# Patient Record
Sex: Female | Born: 1950 | Race: Black or African American | Hispanic: No | Marital: Married | Smoking: Never smoker
Health system: Southern US, Community
[De-identification: ages and names within clinical notes are randomized; demographics above are authoritative.]

## PROBLEM LIST (undated history)

## (undated) ENCOUNTER — Emergency Department (HOSPITAL_COMMUNITY): Admission: EM | Payer: Medicare PPO | Source: Home / Self Care

## (undated) DIAGNOSIS — M1990 Unspecified osteoarthritis, unspecified site: Secondary | ICD-10-CM

## (undated) DIAGNOSIS — M545 Low back pain, unspecified: Secondary | ICD-10-CM

## (undated) DIAGNOSIS — E782 Mixed hyperlipidemia: Secondary | ICD-10-CM

## (undated) DIAGNOSIS — F32A Depression, unspecified: Secondary | ICD-10-CM

## (undated) DIAGNOSIS — G43909 Migraine, unspecified, not intractable, without status migrainosus: Secondary | ICD-10-CM

## (undated) DIAGNOSIS — R51 Headache: Secondary | ICD-10-CM

## (undated) DIAGNOSIS — M109 Gout, unspecified: Secondary | ICD-10-CM

## (undated) DIAGNOSIS — F419 Anxiety disorder, unspecified: Secondary | ICD-10-CM

## (undated) DIAGNOSIS — R519 Headache, unspecified: Secondary | ICD-10-CM

## (undated) DIAGNOSIS — J45909 Unspecified asthma, uncomplicated: Secondary | ICD-10-CM

## (undated) DIAGNOSIS — I10 Essential (primary) hypertension: Secondary | ICD-10-CM

## (undated) DIAGNOSIS — G8929 Other chronic pain: Secondary | ICD-10-CM

## (undated) DIAGNOSIS — G459 Transient cerebral ischemic attack, unspecified: Secondary | ICD-10-CM

## (undated) DIAGNOSIS — E119 Type 2 diabetes mellitus without complications: Secondary | ICD-10-CM

## (undated) DIAGNOSIS — K317 Polyp of stomach and duodenum: Secondary | ICD-10-CM

## (undated) DIAGNOSIS — F329 Major depressive disorder, single episode, unspecified: Secondary | ICD-10-CM

## (undated) DIAGNOSIS — K635 Polyp of colon: Secondary | ICD-10-CM

## (undated) DIAGNOSIS — Z8719 Personal history of other diseases of the digestive system: Secondary | ICD-10-CM

## (undated) HISTORY — PX: LAPAROSCOPIC CHOLECYSTECTOMY: SUR755

## (undated) HISTORY — DX: Major depressive disorder, single episode, unspecified: F32.9

## (undated) HISTORY — PX: BREAST BIOPSY: SHX20

## (undated) HISTORY — DX: Unspecified osteoarthritis, unspecified site: M19.90

## (undated) HISTORY — PX: DILATION AND CURETTAGE OF UTERUS: SHX78

## (undated) HISTORY — DX: Polyp of stomach and duodenum: K31.7

## (undated) HISTORY — PX: CARPAL TUNNEL RELEASE: SHX101

## (undated) HISTORY — PX: TUBAL LIGATION: SHX77

## (undated) HISTORY — DX: Polyp of colon: K63.5

## (undated) HISTORY — DX: Mixed hyperlipidemia: E78.2

## (undated) HISTORY — DX: Depression, unspecified: F32.A

## (undated) HISTORY — DX: Gout, unspecified: M10.9

---

## 1980-06-16 HISTORY — PX: VAGINAL HYSTERECTOMY: SUR661

## 2001-06-16 HISTORY — PX: KNEE ARTHROSCOPY: SHX127

## 2012-10-22 LAB — HM MAMMOGRAPHY

## 2014-11-15 DIAGNOSIS — G459 Transient cerebral ischemic attack, unspecified: Secondary | ICD-10-CM

## 2014-11-15 HISTORY — DX: Transient cerebral ischemic attack, unspecified: G45.9

## 2016-01-11 ENCOUNTER — Inpatient Hospital Stay (HOSPITAL_COMMUNITY)
Admission: EM | Admit: 2016-01-11 | Discharge: 2016-01-15 | DRG: 603 | Disposition: A | Discharge status: Home Health | Payer: Medicare PPO | Attending: Internal Medicine | Admitting: Internal Medicine

## 2016-01-11 ENCOUNTER — Encounter (HOSPITAL_COMMUNITY): Payer: Self-pay

## 2016-01-11 DIAGNOSIS — M109 Gout, unspecified: Secondary | ICD-10-CM | POA: Diagnosis present

## 2016-01-11 DIAGNOSIS — E860 Dehydration: Secondary | ICD-10-CM | POA: Diagnosis present

## 2016-01-11 DIAGNOSIS — Z79899 Other long term (current) drug therapy: Secondary | ICD-10-CM | POA: Diagnosis not present

## 2016-01-11 DIAGNOSIS — J45909 Unspecified asthma, uncomplicated: Secondary | ICD-10-CM | POA: Diagnosis present

## 2016-01-11 DIAGNOSIS — L03116 Cellulitis of left lower limb: Secondary | ICD-10-CM | POA: Diagnosis present

## 2016-01-11 DIAGNOSIS — Z888 Allergy status to other drugs, medicaments and biological substances status: Secondary | ICD-10-CM | POA: Diagnosis not present

## 2016-01-11 DIAGNOSIS — E11622 Type 2 diabetes mellitus with other skin ulcer: Secondary | ICD-10-CM | POA: Diagnosis present

## 2016-01-11 DIAGNOSIS — E1142 Type 2 diabetes mellitus with diabetic polyneuropathy: Secondary | ICD-10-CM | POA: Diagnosis present

## 2016-01-11 DIAGNOSIS — I878 Other specified disorders of veins: Secondary | ICD-10-CM | POA: Diagnosis present

## 2016-01-11 DIAGNOSIS — L98499 Non-pressure chronic ulcer of skin of other sites with unspecified severity: Secondary | ICD-10-CM | POA: Diagnosis present

## 2016-01-11 DIAGNOSIS — Z8673 Personal history of transient ischemic attack (TIA), and cerebral infarction without residual deficits: Secondary | ICD-10-CM

## 2016-01-11 DIAGNOSIS — Z7982 Long term (current) use of aspirin: Secondary | ICD-10-CM | POA: Diagnosis not present

## 2016-01-11 DIAGNOSIS — I10 Essential (primary) hypertension: Secondary | ICD-10-CM

## 2016-01-11 DIAGNOSIS — I159 Secondary hypertension, unspecified: Secondary | ICD-10-CM

## 2016-01-11 DIAGNOSIS — Z6841 Body Mass Index (BMI) 40.0 and over, adult: Secondary | ICD-10-CM

## 2016-01-11 DIAGNOSIS — Z794 Long term (current) use of insulin: Secondary | ICD-10-CM

## 2016-01-11 DIAGNOSIS — E876 Hypokalemia: Secondary | ICD-10-CM | POA: Diagnosis present

## 2016-01-11 DIAGNOSIS — E119 Type 2 diabetes mellitus without complications: Secondary | ICD-10-CM

## 2016-01-11 DIAGNOSIS — M79609 Pain in unspecified limb: Secondary | ICD-10-CM | POA: Diagnosis not present

## 2016-01-11 DIAGNOSIS — E118 Type 2 diabetes mellitus with unspecified complications: Secondary | ICD-10-CM

## 2016-01-11 DIAGNOSIS — A419 Sepsis, unspecified organism: Secondary | ICD-10-CM

## 2016-01-11 HISTORY — DX: Unspecified osteoarthritis, unspecified site: M19.90

## 2016-01-11 HISTORY — DX: Unspecified asthma, uncomplicated: J45.909

## 2016-01-11 HISTORY — DX: Essential (primary) hypertension: I10

## 2016-01-11 HISTORY — DX: Transient cerebral ischemic attack, unspecified: G45.9

## 2016-01-11 LAB — CBC WITH DIFFERENTIAL/PLATELET
Basophils Absolute: 0 10*3/uL (ref 0.0–0.1)
Basophils Relative: 0 %
Eosinophils Absolute: 0.1 10*3/uL (ref 0.0–0.7)
Eosinophils Relative: 1 %
HEMATOCRIT: 40.2 % (ref 36.0–46.0)
Hemoglobin: 13.3 g/dL (ref 12.0–15.0)
LYMPHS ABS: 1.9 10*3/uL (ref 0.7–4.0)
LYMPHS PCT: 10 %
MCH: 30.2 pg (ref 26.0–34.0)
MCHC: 33.1 g/dL (ref 30.0–36.0)
MCV: 91.2 fL (ref 78.0–100.0)
MONO ABS: 0.8 10*3/uL (ref 0.1–1.0)
MONOS PCT: 4 %
NEUTROS ABS: 16.3 10*3/uL — AB (ref 1.7–7.7)
Neutrophils Relative %: 85 %
Platelets: 248 10*3/uL (ref 150–400)
RBC: 4.41 MIL/uL (ref 3.87–5.11)
RDW: 15.2 % (ref 11.5–15.5)
WBC: 19.1 10*3/uL — ABNORMAL HIGH (ref 4.0–10.5)

## 2016-01-11 LAB — COMPREHENSIVE METABOLIC PANEL
ALT: 16 U/L (ref 14–54)
ANION GAP: 9 (ref 5–15)
AST: 19 U/L (ref 15–41)
Albumin: 3.1 g/dL — ABNORMAL LOW (ref 3.5–5.0)
Alkaline Phosphatase: 105 U/L (ref 38–126)
BILIRUBIN TOTAL: 0.6 mg/dL (ref 0.3–1.2)
BUN: 11 mg/dL (ref 6–20)
CALCIUM: 9 mg/dL (ref 8.9–10.3)
CO2: 22 mmol/L (ref 22–32)
Chloride: 102 mmol/L (ref 101–111)
Creatinine, Ser: 1.03 mg/dL — ABNORMAL HIGH (ref 0.44–1.00)
GFR calc Af Amer: >60 mL/min (ref >60)
GFR, EST NON AFRICAN AMERICAN: 55 mL/min — AB (ref >60)
Glucose, Bld: 186 mg/dL — ABNORMAL HIGH (ref 65–99)
POTASSIUM: 3.2 mmol/L — AB (ref 3.5–5.1)
Sodium: 133 mmol/L — ABNORMAL LOW (ref 135–145)
TOTAL PROTEIN: 7.9 g/dL (ref 6.5–8.1)

## 2016-01-11 LAB — I-STAT CG4 LACTIC ACID, ED: LACTIC ACID, VENOUS: 1.73 mmol/L (ref 0.5–1.9)

## 2016-01-11 LAB — GLUCOSE, CAPILLARY
Glucose-Capillary: 174 mg/dL — ABNORMAL HIGH (ref 65–99)
Glucose-Capillary: 230 mg/dL — ABNORMAL HIGH (ref 65–99)

## 2016-01-11 MED ORDER — ATENOLOL 25 MG PO TABS
25.0000 mg | ORAL_TABLET | Freq: Every day | ORAL | Status: DC
  Filled 2016-01-11: qty 1

## 2016-01-11 MED ORDER — HYDROCODONE-ACETAMINOPHEN 10-325 MG PO TABS
1.0000 | ORAL_TABLET | Freq: Four times a day (QID) | ORAL | Status: DC | PRN
  Administered 2016-01-12 – 2016-01-13 (×5): 1 via ORAL
  Filled 2016-01-11 (×5): qty 1

## 2016-01-11 MED ORDER — GABAPENTIN 300 MG PO CAPS
300.0000 mg | ORAL_CAPSULE | Freq: Two times a day (BID) | ORAL | Status: DC
  Administered 2016-01-11 – 2016-01-15 (×8): 300 mg via ORAL
  Filled 2016-01-11 (×9): qty 1

## 2016-01-11 MED ORDER — ATENOLOL 50 MG PO TABS
25.0000 mg | ORAL_TABLET | Freq: Every day | ORAL | Status: DC
  Administered 2016-01-12 – 2016-01-15 (×4): 25 mg via ORAL
  Filled 2016-01-11 (×4): qty 1

## 2016-01-11 MED ORDER — ENOXAPARIN SODIUM 40 MG/0.4ML ~~LOC~~ SOLN
40.0000 mg | SUBCUTANEOUS | Status: DC
Start: 2016-01-11 — End: 2016-01-11
  Administered 2016-01-11: 40 mg via SUBCUTANEOUS
  Filled 2016-01-11: qty 0.4

## 2016-01-11 MED ORDER — ONDANSETRON HCL 4 MG/2ML IJ SOLN: 4.0000 mg | Freq: Four times a day (QID) | INTRAMUSCULAR | Status: DC | PRN

## 2016-01-11 MED ORDER — PIPERACILLIN-TAZOBACTAM 3.375 G IVPB
3.3750 g | Freq: Three times a day (TID) | INTRAVENOUS | Status: AC
  Administered 2016-01-11 – 2016-01-14 (×9): 3.375 g via INTRAVENOUS
  Filled 2016-01-11 (×10): qty 50

## 2016-01-11 MED ORDER — ALBUTEROL SULFATE (2.5 MG/3ML) 0.083% IN NEBU: 2.5000 mg | INHALATION_SOLUTION | Freq: Four times a day (QID) | RESPIRATORY_TRACT | Status: DC | PRN

## 2016-01-11 MED ORDER — ACETAMINOPHEN 650 MG RE SUPP: 650.0000 mg | Freq: Four times a day (QID) | RECTAL | Status: DC | PRN

## 2016-01-11 MED ORDER — INSULIN ASPART 100 UNIT/ML ~~LOC~~ SOLN
0.0000 [IU] | Freq: Three times a day (TID) | SUBCUTANEOUS | Status: DC
  Administered 2016-01-11: 2 [IU] via SUBCUTANEOUS
  Administered 2016-01-11: 3 [IU] via SUBCUTANEOUS
  Administered 2016-01-12: 5 [IU] via SUBCUTANEOUS
  Administered 2016-01-12: 2 [IU] via SUBCUTANEOUS
  Administered 2016-01-12 – 2016-01-13 (×2): 3 [IU] via SUBCUTANEOUS
  Administered 2016-01-13: 5 [IU] via SUBCUTANEOUS
  Administered 2016-01-13 – 2016-01-14 (×4): 3 [IU] via SUBCUTANEOUS
  Administered 2016-01-15: 2 [IU] via SUBCUTANEOUS

## 2016-01-11 MED ORDER — VANCOMYCIN HCL 10 G IV SOLR
1250.0000 mg | Freq: Two times a day (BID) | INTRAVENOUS | Status: AC
  Administered 2016-01-11 – 2016-01-14 (×7): 1250 mg via INTRAVENOUS
  Filled 2016-01-11 (×7): qty 1250

## 2016-01-11 MED ORDER — MAGNESIUM CITRATE PO SOLN
1.0000 | Freq: Once | ORAL | Status: AC | PRN
  Administered 2016-01-12: 1 via ORAL
  Filled 2016-01-11: qty 296

## 2016-01-11 MED ORDER — ONDANSETRON HCL 4 MG PO TABS: 4.0000 mg | ORAL_TABLET | Freq: Four times a day (QID) | ORAL | Status: DC | PRN

## 2016-01-11 MED ORDER — MAGNESIUM GLUCONATE 500 MG PO TABS
500.0000 mg | ORAL_TABLET | Freq: Two times a day (BID) | ORAL | Status: DC
  Administered 2016-01-11 – 2016-01-15 (×8): 500 mg via ORAL
  Filled 2016-01-11 (×9): qty 1

## 2016-01-11 MED ORDER — SODIUM CHLORIDE 0.9 % IV BOLUS (SEPSIS)
1000.0000 mL | Freq: Once | INTRAVENOUS | Status: AC
  Administered 2016-01-11: 1000 mL via INTRAVENOUS

## 2016-01-11 MED ORDER — CLONAZEPAM 0.5 MG PO TABS: 0.5000 mg | ORAL_TABLET | Freq: Every day | ORAL | Status: DC | PRN

## 2016-01-11 MED ORDER — SENNOSIDES-DOCUSATE SODIUM 8.6-50 MG PO TABS
1.0000 | ORAL_TABLET | Freq: Every evening | ORAL | Status: DC | PRN
  Filled 2016-01-11: qty 1

## 2016-01-11 MED ORDER — MORPHINE SULFATE (PF) 2 MG/ML IV SOLN: 1.0000 mg | INTRAVENOUS | Status: DC | PRN

## 2016-01-11 MED ORDER — CYCLOBENZAPRINE HCL 10 MG PO TABS
10.0000 mg | ORAL_TABLET | Freq: Three times a day (TID) | ORAL | Status: DC | PRN
  Administered 2016-01-12 – 2016-01-14 (×2): 10 mg via ORAL
  Filled 2016-01-11 (×2): qty 1

## 2016-01-11 MED ORDER — PIPERACILLIN-TAZOBACTAM 3.375 G IVPB 30 MIN
3.3750 g | Freq: Once | INTRAVENOUS | Status: AC
  Administered 2016-01-11: 3.375 g via INTRAVENOUS
  Filled 2016-01-11: qty 50

## 2016-01-11 MED ORDER — HYDROCERIN EX CREA
TOPICAL_CREAM | Freq: Every day | CUTANEOUS | Status: DC
  Administered 2016-01-12 – 2016-01-14 (×3): via TOPICAL
  Administered 2016-01-15: 1 via TOPICAL
  Filled 2016-01-11 (×2): qty 113

## 2016-01-11 MED ORDER — TRAZODONE HCL 50 MG PO TABS: 25.0000 mg | ORAL_TABLET | Freq: Every evening | ORAL | Status: DC | PRN

## 2016-01-11 MED ORDER — ACETAMINOPHEN 325 MG PO TABS
650.0000 mg | ORAL_TABLET | Freq: Four times a day (QID) | ORAL | Status: DC | PRN
  Filled 2016-01-11 (×2): qty 2

## 2016-01-11 MED ORDER — BISACODYL 10 MG RE SUPP: 10.0000 mg | Freq: Every day | RECTAL | Status: DC | PRN

## 2016-01-11 MED ORDER — ALBUTEROL SULFATE HFA 108 (90 BASE) MCG/ACT IN AERS: 2.0000 | INHALATION_SPRAY | Freq: Four times a day (QID) | RESPIRATORY_TRACT | Status: DC | PRN

## 2016-01-11 MED ORDER — ASPIRIN 325 MG PO TABS
325.0000 mg | ORAL_TABLET | Freq: Every day | ORAL | Status: DC
  Administered 2016-01-11 – 2016-01-15 (×5): 325 mg via ORAL
  Filled 2016-01-11 (×5): qty 1

## 2016-01-11 MED ORDER — ALLOPURINOL 300 MG PO TABS
300.0000 mg | ORAL_TABLET | Freq: Every day | ORAL | Status: DC
  Administered 2016-01-11 – 2016-01-15 (×5): 300 mg via ORAL
  Filled 2016-01-11 (×4): qty 1
  Filled 2016-01-11: qty 3

## 2016-01-11 MED ORDER — SODIUM CHLORIDE 0.9 % IV SOLN
INTRAVENOUS | Status: DC
  Administered 2016-01-11: 11:00:00 via INTRAVENOUS

## 2016-01-11 MED ORDER — VANCOMYCIN HCL 10 G IV SOLR
2000.0000 mg | Freq: Once | INTRAVENOUS | Status: AC
  Administered 2016-01-11: 2000 mg via INTRAVENOUS
  Filled 2016-01-11: qty 2000

## 2016-01-11 MED ORDER — POTASSIUM CHLORIDE CRYS ER 20 MEQ PO TBCR
40.0000 meq | EXTENDED_RELEASE_TABLET | Freq: Once | ORAL | Status: AC
  Administered 2016-01-11: 40 meq via ORAL
  Filled 2016-01-11: qty 2

## 2016-01-11 MED ORDER — ENOXAPARIN SODIUM 80 MG/0.8ML ~~LOC~~ SOLN
80.0000 mg | SUBCUTANEOUS | Status: DC
  Administered 2016-01-12 – 2016-01-14 (×3): 80 mg via SUBCUTANEOUS
  Filled 2016-01-11 (×3): qty 0.8

## 2016-01-11 MED ORDER — NIFEDIPINE ER OSMOTIC RELEASE 90 MG PO TB24
90.0000 mg | ORAL_TABLET | Freq: Every day | ORAL | Status: DC
  Administered 2016-01-12 – 2016-01-15 (×4): 90 mg via ORAL
  Filled 2016-01-11 (×5): qty 1

## 2016-01-11 NOTE — ED Provider Notes (Signed)
[facility]-EMERGENCY DEPT Provider Note   CSN: [id number] Arrival date & time: 01/11/16  2952  First Provider Contact:  None       History   Chief Complaint Chief Complaint  Patient presents with  . Fever    HPI [name] is a 65 y.o. female.  HPI   65 year old obese African-American female who presents with a 2 day history of left lower extremity swelling. 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 She states over the last 2 days, she's had progressively worsening pain and swelling of her left lower externally. He began to get red and hot yesterday with increased pain. Pain is an aching, throbbing pain that is worse with any movement. She then spiked a fever to 101 yesterday and felt generally unwell. She also felt mildly dehydrated. She simply presents for further evaluation. The redness has worsened and spread over the last 12 hours per report. Denies any vomiting. She has not been on any antibiotics recently.  Past Medical History:  Diagnosis Date  . Arthritis   . Asthma   . Diabetes mellitus without complication (HCC)   . Hypertension   . TIA (transient ischemic attack)     Patient Active Problem List   Diagnosis Date Noted  . Cellulitis of left leg 01/11/2016  . DM (diabetes mellitus) (HCC) 01/11/2016  . Hypertension 01/11/2016    Past Surgical History:  Procedure Laterality Date  . CARPAL TUNNEL RELEASE    . CHOLECYSTECTOMY    . KNEE SURGERY      OB History    No data available       Home Medications    Prior to Admission medications   Medication Sig Start Date End Date Taking? Authorizing Provider  albuterol (PROVENTIL HFA;VENTOLIN HFA) 108 (90 Base) MCG/ACT inhaler Inhale 2 puffs into the lungs every 6 (six) hours as needed for wheezing or shortness of breath.   Yes Historical Provider, MD  allopurinol (ZYLOPRIM) 300 MG tablet  Take 300 mg by mouth daily.   Yes Historical Provider, MD  aspirin 325 MG tablet Take 325 mg by mouth daily.   Yes Historical Provider, MD  atenolol (TENORMIN) 25 MG tablet Take 25-50 mg by mouth daily. Take 50 mg every morning  Take 25 mg every evening   Yes Historical Provider, MD  clonazePAM (KLONOPIN) 0.5 MG tablet Take 0.5 mg by mouth daily as needed for anxiety.   Yes Historical Provider, MD  cyclobenzaprine (FLEXERIL) 10 MG tablet Take 10 mg by mouth 3 (three) times daily as needed for muscle spasms.   Yes Historical Provider, MD  gabapentin (NEURONTIN) 300 MG capsule Take 300 mg by mouth 2 (two) times daily.   Yes Historical Provider, MD  HYDROcodone-acetaminophen (NORCO) 10-325 MG tablet Take 1 tablet by mouth every 6 (six) hours as needed for moderate pain.   Yes Historical Provider, MD  Insulin Degludec (TRESIBA FLEXTOUCH) 100 UNIT/ML SOPN Inject 75 Units into the skin daily.   Yes Historical Provider, MD  insulin glulisine (APIDRA) 100 UNIT/ML injection Inject 10-15 Units into the skin 2 (two) times daily. <160 take 10 units  >161 take 15 units   Yes Historical Provider, MD  magnesium gluconate (MAGONATE) 500 MG tablet Take 500 mg by mouth 2 (two) times daily.   Yes Historical Provider, MD  NIFEdipine (PROCARDIA XL/ADALAT-CC) 90 MG 24 hr tablet Take 90 mg  by mouth daily.   Yes Historical Provider, MD    Family History History reviewed. No pertinent family history.  Social History Social History  Substance Use Topics  . Smoking status: Never Smoker  . Smokeless tobacco: Never Used  . Alcohol use Not on file     Allergies   Ace inhibitors and Metformin and related   Review of Systems Review of Systems  Constitutional: Positive for chills, fatigue and fever.  HENT: Negative for congestion and rhinorrhea.   Eyes: Negative for visual disturbance.  Respiratory: Negative for cough, shortness of breath and wheezing.   Cardiovascular: Positive for leg swelling. Negative for  chest pain.  Gastrointestinal: Negative for abdominal pain, diarrhea and nausea.  Genitourinary: Negative for dysuria and flank pain.  Musculoskeletal: Negative for neck pain.  Skin: Positive for rash.  Neurological: Negative for syncope, weakness and headaches.     Physical Exam Updated Vital Signs BP 118/60 (BP Location: Left Arm)   Pulse 94   Temp (!) 100.4 F (38 C) (Oral)   Resp 20   Ht  (1.651 m)   Wt (!) 351 lb 6.6 oz (159.4 kg)   SpO2 97%   BMI 58.48 kg/m   Physical Exam  Constitutional: She appears well-developed and well-nourished. No distress.  HENT:  Head: Normocephalic.  Mouth/Throat: Oropharynx is clear and moist. No oropharyngeal exudate.  Eyes: Conjunctivae are normal. Pupils are equal, round, and reactive to light.  Neck: Normal range of motion. Neck supple.  Cardiovascular: Normal rate, regular rhythm and normal heart sounds.  Exam reveals no friction rub.   No murmur heard. Pulmonary/Chest: Effort normal and breath sounds normal. No respiratory distress. She has no wheezes. She has no rales.  Abdominal: Soft. She exhibits no distension. There is no tenderness.  Musculoskeletal: She exhibits no edema.  Neurological: She is alert. She exhibits normal muscle tone.  Skin: Skin is warm. Rash noted.  Nursing note and vitals reviewed.   LOWER EXTREMITY EXAM: LEFT  INSPECTION & PALPATION: Marked chronic venous stasis changes and lichenification of feet bilaterally. Left lower extremity with chronic skin changes with superimposed erythema, tenderness to palpation, and warmth. No open or draining wounds. No crepitance. No tenderness beyond the areas of erythema.  SENSORY: sensation is intact to light touch in:  Superficial peroneal nerve distribution (over dorsum of foot) Deep peroneal nerve distribution (over first dorsal web space) Sural nerve distribution (over lateral aspect 5th metatarsal) Saphenous nerve distribution (over medial instep)  MOTOR:    + Motor EHL (great toe dorsiflexion) + FHL (great toe plantar flexion)  + TA (ankle dorsiflexion)  + GSC (ankle plantar flexion)  VASCULAR: 2+ dorsalis pedis and posterior tibialis pulses Capillary refill < 2 sec, toes warm and well-perfused  COMPARTMENTS: Soft, warm, well-perfused No pain with passive extension No parethesias   ED Treatments / Results  Labs (all labs ordered are listed, but only abnormal results are displayed) Labs Reviewed  CBC WITH DIFFERENTIAL/PLATELET - Abnormal; Notable for the following:       Result Value   WBC 19.1 (*)    Neutro Abs 16.3 (*)    All other components within normal limits  COMPREHENSIVE METABOLIC PANEL - Abnormal; Notable for the following:    Sodium 133 (*)    Potassium 3.2 (*)    Glucose, Bld 186 (*)    Creatinine, Ser 1.03 (*)    Albumin 3.1 (*)    GFR calc non Af Amer 55 (*)    All other  components within normal limits  GLUCOSE, CAPILLARY - Abnormal; Notable for the following:    Glucose-Capillary 174 (*)    All other components within normal limits  CULTURE, BLOOD (ROUTINE X 2)  CULTURE, BLOOD (ROUTINE X 2)  HEMOGLOBIN A1C  HIV ANTIBODY (ROUTINE TESTING)  I-STAT CG4 LACTIC ACID, ED    EKG  EKG Interpretation None       Radiology No results found.  Procedures Procedures (including critical care time)  Medications Ordered in ED Medications  vancomycin (VANCOCIN) 1,250 mg in sodium chloride 0.9 % 250 mL IVPB (not administered)  allopurinol (ZYLOPRIM) tablet 300 mg (300 mg Oral Given 01/11/16 1116)  aspirin tablet 325 mg (325 mg Oral Given 01/11/16 1116)  clonazePAM (KLONOPIN) tablet 0.5 mg (not administered)  cyclobenzaprine (FLEXERIL) tablet 10 mg (not administered)  gabapentin (NEURONTIN) capsule 300 mg (300 mg Oral Given 01/11/16 1116)  HYDROcodone-acetaminophen (NORCO) 10-325 MG per tablet 1 tablet (not administered)  magnesium gluconate (MAGONATE) tablet 500 mg (500 mg Oral Not Given 01/11/16 1123)   NIFEdipine (PROCARDIA XL/ADALAT-CC) 24 hr tablet 90 mg (90 mg Oral Not Given 01/11/16 1123)  0.9 %  sodium chloride infusion ( Intravenous Transfusing/Transfer 01/11/16 1136)  acetaminophen (TYLENOL) tablet 650 mg (not administered)    Or  acetaminophen (TYLENOL) suppository 650 mg (not administered)  morphine 2 MG/ML injection 1 mg (not administered)  traZODone (DESYREL) tablet 25 mg (not administered)  senna-docusate (Senokot-S) tablet 1 tablet (not administered)  magnesium citrate solution 1 Bottle (not administered)  ondansetron (ZOFRAN) tablet 4 mg (not administered)    Or  ondansetron (ZOFRAN) injection 4 mg (not administered)  insulin aspart (novoLOG) injection 0-9 Units (2 Units Subcutaneous Given 01/11/16 1251)  piperacillin-tazobactam (ZOSYN) IVPB 3.375 g (not administered)  hydrocerin (EUCERIN) cream ( Topical Not Given 01/11/16 1124)  enoxaparin (LOVENOX) injection 80 mg (not administered)  albuterol (PROVENTIL) (2.5 MG/3ML) 0.083% nebulizer solution 2.5 mg (not administered)  atenolol (TENORMIN) tablet 25 mg (25 mg Oral Not Given 01/11/16 1300)  vancomycin (VANCOCIN) 2,000 mg in sodium chloride 0.9 % 500 mL IVPB (0 mg Intravenous Stopped 01/11/16 1059)  sodium chloride 0.9 % bolus 1,000 mL (0 mLs Intravenous Stopped 01/11/16 1041)  potassium chloride SA (K-DUR,KLOR-CON) CR tablet 40 mEq (40 mEq Oral Given 01/11/16 0918)  piperacillin-tazobactam (ZOSYN) IVPB 3.375 g (3.375 g Intravenous Transfusing/Transfer 01/11/16 1136)     Initial Impression / Assessment and Plan / ED Course  I have reviewed the triage vital signs and the nursing notes.  Pertinent labs & imaging results that were available during my care of the patient were reviewed by me and considered in my medical decision making (see chart for details).  65 year old African-American female with past medical history of poorly controlled diabetes who presents with a 2 day history of left lower extremity redness, swelling, and  pain with fever to 101 at home. Exam and history concerning for acute cellulitis of left lower extremity, likely secondary to skin breakdown from chronic skin changes. Her fever is concerning for possible developing sepsis and she does have elevated white blood cell count. Lactic acid is normal, however. No hypotension or signs or shock. No tenderness beyond the areas of erythema to suggest necrotizing infection. DVT less likely in the setting of acute change, warmth, and fever. Will start IV vancomycin given patient's history of DKA and her recurrent, complicated cellulitis, and admit to medicine for further management.  Final Clinical Impressions(s) / ED Diagnoses   Final diagnoses:  Cellulitis of left leg  Type 2 diabetes mellitus with complication, with long-term current use of insulin (HCC)  Sepsis, due to unspecified organism [facility])     Shaune Pollack, MD 01/11/16 [phone]

## 2016-01-11 NOTE — ED Triage Notes (Signed)
Pt presents with report of fever that began last night.  Pt reports fever of 102.4.  Pt reports area to L foot x 1 year, seen at wound center in [facility].  Pt reports warmth to foot and intermittent drainage.

## 2016-01-11 NOTE — H&P (Signed)
History and Physical    [name] ZOX:[id number] DOB: 01/08/1951 DOA: 01/11/2016   PCP: No primary care provider on file.   Patient coming from:  Home  Chief Complaint: Left leg pain and redness   HPI: [name] is a 66 y.o. female recently moving form [facility], [place] on 7/4 to be with her son,  with medical history significant for DM, HTN, prior TIA, astha, arthritis, history of lower extremity cellulitis  In September 2016 requiring 1 month hospitalization and wound care follow up until 10/2015, presenting today with 2 day history of left lower extremity swelling, with worsening pain and increasing erythema and warmth since yesterday,She reports that this redness has been spreading over the last 12 hours.. The pain is throbbing, aching and worse with any movement.she denies any take bites or any insect bite. Denies any dog/cat contacts. She does admit to scratching that affected area  When becomes pruritic. She had spike a fever to 101 yesterday, and felt generally unwell. She was also dehydrated. She denies any nausea or vomiting. She denies any diarrhea. No urinary symptoms. No confusion is reported.   ED Course:  BP 115/68   Pulse 94   Temp 99.4 F (37.4 C) (Oral)   Resp 18   Ht 5\' 5"  (1.651 m)   Wt (!) 161.5 kg (356 lb)   SpO2 99%   BMI 59.24 kg/m    Received IV Vanco  Receiving aggressive hydration  White count 19.1, creatinine 1.03 lactic acid 1.73. Glucose 186. Blood cultures are pending   Review of Systems: As per HPI otherwise 10 point review of systems negative.   Past Medical History:  Diagnosis Date  . Arthritis   . Asthma   . Diabetes mellitus without complication (HCC)   . Hypertension   . TIA (transient ischemic attack)     Past Surgical History:  Procedure Laterality Date  . CARPAL TUNNEL RELEASE    . CHOLECYSTECTOMY    . KNEE SURGERY      Social History Social History   Social History  . Marital status: Married    Spouse name: N/A  .  Number of children: N/A  . Years of education: N/A   Occupational History  . Not on file.   Social History Main Topics  . Smoking status: Never Smoker  . Smokeless tobacco: Never Used  . Alcohol use Not on file  . Drug use: Unknown  . Sexual activity: Not on file   Other Topics Concern  . Not on file   Social History Narrative  . No narrative on file     Allergies  Allergen Reactions  . Ace Inhibitors Swelling  . Metformin And Related Other (See Comments)    chills    History reviewed. No pertinent family history.    Prior to Admission medications   Medication Sig Start Date End Date Taking? Authorizing Provider  albuterol (PROVENTIL HFA;VENTOLIN HFA) 108 (90 Base) MCG/ACT inhaler Inhale 2 puffs into the lungs every 6 (six) hours as needed for wheezing or shortness of breath.   Yes Historical Provider, MD  aspirin 325 MG tablet Take 325 mg by mouth daily.   Yes Historical Provider, MD  atenolol (TENORMIN) 25 MG tablet Take 25-50 mg by mouth daily. Take 50 mg every morning  Take 25 mg every evening   Yes Historical Provider, MD  cyclobenzaprine (FLEXERIL) 10 MG tablet Take 10 mg by mouth 3 (three) times daily as needed for muscle spasms.   Yes Historical  Provider, MD  gabapentin (NEURONTIN) 300 MG capsule Take 300 mg by mouth 2 (two) times daily.   Yes Historical Provider, MD  HYDROcodone-acetaminophen (NORCO) 10-325 MG tablet Take 1 tablet by mouth every 6 (six) hours as needed for moderate pain.   Yes Historical Provider, MD    Physical Exam:    Vitals:   01/11/16 0745 01/11/16 0921  BP: 125/68 115/68  Pulse: 100 94  Resp: 18 18  Temp: 99.4 F (37.4 C)   TempSrc: Oral   SpO2: 99% 99%  Weight: (!) 161.5 kg (356 lb)   Height: 5\' 5"  (1.651 m)        Constitutional: NAD, calm, comfortable  Vitals:   01/11/16 0745 01/11/16 0921  BP: 125/68 115/68  Pulse: 100 94  Resp: 18 18  Temp: 99.4 F (37.4 C)   TempSrc: Oral   SpO2: 99% 99%  Weight: (!) 161.5  kg (356 lb)   Height: 5\' 5"  (1.651 m)    Eyes: PERRL, lids and conjunctivae normal ENMT: Mucous membranes are dry. Posterior pharynx clear of any exudate or lesions. Normal dentition.  Neck: normal, supple, no masses, no thyromegaly Respiratory: clear to auscultation bilaterally, no wheezing, no crackles. Normal respiratory effort. No accessory muscle use.  Cardiovascular: Regular rate and rhythm, no murmurs / rubs / gallops. No extremity edema. 2+ pedal pulses. No carotid bruits.  Abdomen: obese,no tenderness, no masses palpated. No hepatosplenomegaly. Bowel sounds positive.  Musculoskeletal: no clubbing / cyanosis.good ROM Skin remakable for  Dry scaling/eschar large wound on the left lower extremity without swelling, with surrounding erythema about 5 cm beyond the eschar border. Also seen, some areas of erythema on the RLE, and chronic changes  Neurologic: CN 2-12 grossly intact. Sensation intact, DTR normal. Strength 5/5 in all 4.  Psychiatric: Normal judgment and insight. Alert and oriented x 3. Normal mood.     Labs on Admission: I have personally reviewed following labs and imaging studies  CBC:  Recent Labs Lab 01/11/16 0815  WBC 19.1*  NEUTROABS 16.3*  HGB 13.3  HCT 40.2  MCV 91.2  PLT 248    Basic Metabolic Panel:  Recent Labs Lab 01/11/16 0815  NA 133*  K 3.2*  CL 102  CO2 22  GLUCOSE 186*  BUN 11  CREATININE 1.03*  CALCIUM 9.0    GFR: Estimated Creatinine Clearance: 83.8 mL/min (by C-G formula based on SCr of 1.03 mg/dL).  Liver Function Tests:  Recent Labs Lab 01/11/16 0815  AST 19  ALT 16  ALKPHOS 105  BILITOT 0.6  PROT 7.9  ALBUMIN 3.1*   No results for input(s): LIPASE, AMYLASE in the last 168 hours. No results for input(s): AMMONIA in the last 168 hours.  Coagulation Profile: No results for input(s): INR, PROTIME in the last 168 hours.  Cardiac Enzymes: No results for input(s): CKTOTAL, CKMB, CKMBINDEX, TROPONINI in the last 168  hours.  BNP (last 3 results) No results for input(s): PROBNP in the last 8760 hours.  HbA1C: No results for input(s): HGBA1C in the last 72 hours.  CBG: No results for input(s): GLUCAP in the last 168 hours.  Lipid Profile: No results for input(s): CHOL, HDL, LDLCALC, TRIG, CHOLHDL, LDLDIRECT in the last 72 hours.  Thyroid Function Tests: No results for input(s): TSH, T4TOTAL, FREET4, T3FREE, THYROIDAB in the last 72 hours.  Anemia Panel: No results for input(s): VITAMINB12, FOLATE, FERRITIN, TIBC, IRON, RETICCTPCT in the last 72 hours.  Urine analysis: No results found for: COLORURINE, APPEARANCEUR, LABSPEC,  PHURINE, GLUCOSEU, HGBUR, BILIRUBINUR, KETONESUR, PROTEINUR, UROBILINOGEN, NITRITE, LEUKOCYTESUR  Sepsis Labs: (procalcitonin:4,lacticidven:4) )No results found for this or any previous visit (from the past 240 hour(s)).   Radiological Exams on Admission: No results found.  EKG: Independently reviewed.  Assessment/Plan Principal Problem:   Cellulitis of left leg Active Problems:   DM (diabetes mellitus) (HCC)   Hypertension    Cellulitis, non purulent  LLE, recurrent. She has a history of left lower extremity cellulitis requiring 34 days of hospitalization in [place], followed by wound care until May of this year. Does have leukocytosis WBC 19.1, Temp 99,  Initial lactic acid 1.73, VSS,  and nontoxic appearing. Due to history of diabetes, recurrence of her cellulitis with prior admission in 02/2015  requiring greater than 1 month hospitalization and Vancomycin for greater than 2 days, fever, WBC >19, patient will meet the IP status  -Admit to inpatient med surg  Cellulitis order set X ray r/o osteomyelitis -Continue broad spectrum antibiotics with vancomycin and zosyn as per Pharmacy and order set  Blood cultures HIV as recommended in order set  IVF If cellulitis not improving by tomorrow, will obtain ortho and ID consult  Consult wound  care  Consult PT/OT/Nutrition  Type II Diabetes with neuropathy Current blood sugar level is 186 No results found for: HGBA1C Hgb A1C Hold home oral diabetic medications.   SSI Carb modified diet. Continue neurontin PAin control as needed   Hypertension BP 115/68   Pulse 94   Controlled Continue home anti-hypertensive medications    Hypokalemia, may be due to diuretics taken prior to admission.  Current K  3.2  Received 40  Meq po x1 .Expected to improve as she is not on diuresis at this time  Oral replenishment as needed Repeat CMET in am  History of Asthma without exacerbation, sats in 90s on RA Continue home inhalers as needed    DVT prophylaxis: Lovenox  Code Status:   Full   Family Communication:  Discussed with patient Disposition Plan: Expect patient to be discharged to home after condition improves Consults called:    Wound care  Admission status:  Inpatient  Medsurg      [facility] E, PA-C [facility]   If 7PM-7AM, please contact night-coverage [email or web address] Password [facility]  01/11/2016, 10:22 AM

## 2016-01-11 NOTE — Consult Note (Addendum)
WOC Nurse wound consult note Reason for Consult: Consult requested for left leg.  Pt is well informed regarding history of left leg wound; she previously had a full thickness wound which was treated by an outpatient wound care center for 30 weeks in [place] before it eventually healed.  Afterwards, her skin remained dry and raised to the left calf.  Recently, it developed drainage, swelling and pain, and she is admitted with cellulitis to LLE.  IV antibiotics have already been started for systemic coverage. Wound type: Left calf with dry cracked skin; this is NOT eschar. Appearance consistent with chronic venous stasis changes which extends across lower calf and foot. Drainage (amount, consistency, odor) Previous drainage has resolved at this time, and there is no odor Periwound: Loose dry cracked skin removed easily in patchy areas, revealing intact light-colored skin underneath.  No role for dressing changes at this time. Plan: Apply Eucerin to left leg Q day after bathing or showering and roughly towel-drying to assist with removal of nonviable skin. Discussed plan of care with patient and she verbalized understanding. Please re-consult if further assistance is needed.  Thank-you,  Cammie Mcgee MSN, RN, CWOCN, [place], CNS [phone]

## 2016-01-11 NOTE — Progress Notes (Addendum)
Pharmacy Antibiotic Note  [name] is a 65 y.o. female admitted on 01/11/2016 with cellulitis.  Pharmacy has been consulted for vancomycin and zosyn dosing. Tmax is 99.4 and WBC is elevated at 19.1. SCr is 1.03 and lactic acid is <2.   Plan: - Vancomycin 2gm IV x 1 then 1250mg  IV Q12H - Zosyn 3.375g IV Q8H (4 hr inf) - F/u renal fxn, C&S, clinical status and trough at SS  Height: 5\' 5"  (165.1 cm) Weight: (!) 356 lb (161.5 kg) IBW/kg (Calculated) : 57  Temp (24hrs), Avg:99.4 F (37.4 C), Min:99.4 F (37.4 C), Max:99.4 F (37.4 C)   Recent Labs Lab 01/11/16 0815 01/11/16 0820  WBC 19.1*  --   CREATININE 1.03*  --   LATICACIDVEN  --  1.73    Estimated Creatinine Clearance: 83.8 mL/min (by C-G formula based on SCr of 1.03 mg/dL).    Allergies  Allergen Reactions  . Ace Inhibitors Swelling  . Metformin And Related Other (See Comments)    chills    Antimicrobials this admission: Vanc 7/28>> Zosyn 7/28>>  Dose adjustments this admission: N/A  Microbiology results: Pending  Thank you for allowing pharmacy to be a part of this patient's care.  Genora Arp, Drake Leach 01/11/2016 8:54 AM

## 2016-01-12 DIAGNOSIS — L03116 Cellulitis of left lower limb: Principal | ICD-10-CM

## 2016-01-12 LAB — COMPREHENSIVE METABOLIC PANEL
ALBUMIN: 2.6 g/dL — AB (ref 3.5–5.0)
ALT: 15 U/L (ref 14–54)
ANION GAP: 5 (ref 5–15)
AST: 16 U/L (ref 15–41)
Alkaline Phosphatase: 90 U/L (ref 38–126)
BILIRUBIN TOTAL: 0.8 mg/dL (ref 0.3–1.2)
BUN: 8 mg/dL (ref 6–20)
CHLORIDE: 107 mmol/L (ref 101–111)
CO2: 23 mmol/L (ref 22–32)
Calcium: 8.3 mg/dL — ABNORMAL LOW (ref 8.9–10.3)
Creatinine, Ser: 0.89 mg/dL (ref 0.44–1.00)
GFR calc Af Amer: >60 mL/min (ref >60)
Glucose, Bld: 196 mg/dL — ABNORMAL HIGH (ref 65–99)
POTASSIUM: 3.5 mmol/L (ref 3.5–5.1)
Sodium: 135 mmol/L (ref 135–145)
TOTAL PROTEIN: 6.9 g/dL (ref 6.5–8.1)

## 2016-01-12 LAB — CBC
HEMATOCRIT: 36.6 % (ref 36.0–46.0)
HEMOGLOBIN: 11.9 g/dL — AB (ref 12.0–15.0)
MCH: 29.8 pg (ref 26.0–34.0)
MCHC: 32.5 g/dL (ref 30.0–36.0)
MCV: 91.7 fL (ref 78.0–100.0)
Platelets: 226 10*3/uL (ref 150–400)
RBC: 3.99 MIL/uL (ref 3.87–5.11)
RDW: 15.5 % (ref 11.5–15.5)
WBC: 10.1 10*3/uL (ref 4.0–10.5)

## 2016-01-12 LAB — GLUCOSE, CAPILLARY
GLUCOSE-CAPILLARY: 193 mg/dL — AB (ref 65–99)
GLUCOSE-CAPILLARY: 218 mg/dL — AB (ref 65–99)
GLUCOSE-CAPILLARY: 260 mg/dL — AB (ref 65–99)
GLUCOSE-CAPILLARY: 240 mg/dL — AB (ref 65–99)
GLUCOSE-CAPILLARY: 217 mg/dL — AB (ref 65–99)
Glucose-Capillary: 213 mg/dL — ABNORMAL HIGH (ref 65–99)

## 2016-01-12 LAB — HEMOGLOBIN A1C
HEMOGLOBIN A1C: 9.3 % — AB (ref 4.8–5.6)
MEAN PLASMA GLUCOSE: 220 mg/dL

## 2016-01-12 LAB — HIV ANTIBODY (ROUTINE TESTING W REFLEX): HIV Screen 4th Generation wRfx: NONREACTIVE

## 2016-01-12 MED ORDER — SODIUM CHLORIDE 0.9 % IV SOLN
INTRAVENOUS | Status: AC
  Administered 2016-01-12: 12:00:00 via INTRAVENOUS

## 2016-01-12 NOTE — Progress Notes (Signed)
PROGRESS NOTE                                                                                                                                                                                                             Patient Demographics:    [name], is a 65 y.o. female, DOB - 1951/02/08, DJT:[id number]  Admit date - 01/11/2016   Admitting Physician Starleen Arms, MD  Outpatient Primary MD for the patient is No primary care provider on file.  LOS - 1  Chief Complaint  Patient presents with  . Fever       Brief Narrative   [name] is a 65 y.o. female recently moving form [facility], [place] on 7/4 to be with her son,  with medical history significant for DM, HTN, prior TIA, asthma, arthritis, history of lower extremity cellulitis  In September 2016 requiring 1 month hospitalization and wound care follow up until 10/2015, presenting today with 2 day history of left lower extremity swelling, with worsening pain and increasing erythema and warmth since yesterday,She reports that this redness has been spreading over the last 12 hours.. The pain is throbbing, aching and worse with any movement.she denies any take bites or any insect bite. Denies any dog/cat contacts. She does admit to scratching that affected area  When becomes pruritic.   She had spike a fever to 101 yesterday, and felt generally unwell. She was also dehydrated. She denies any nausea or vomiting. She denies any diarrhea. No urinary symptoms. No confusion is reported.   Subjective:    [name] today has, No headache, No chest pain, No abdominal pain - No Nausea, No new weakness tingling or numbness, No Cough - SOB. Is her left leg already feels better.   Assessment  & Plan :     1.Recurrent left lower extremity cellulitis due to underlying venous stasis. She was hospitalized in September at outside hospital for the same reason. She has  history of venous stasis and is likely causing recurrent infection. She also is diabetic. No open wounds or drainage noted. She has been placed on empiric vancomycin and Zosyn with good effect and she feels better, follow cultures, check ABIs. If stable will transition to oral antibiotics in the next 2-3 days with outpatient vascular surgery follow-up. Patient requested to keep her leg elevated.  2. Morbid obesity. Follow with PCP for weight loss.  3. Diabetic peripheral neuropathy. Continue Neurontin.  4. Gout. On allopurinol.  5. Essential hypertension. Continue beta blocker and Nifedipine .  6. HX of asthma. Stable. Supportive care. No wheezing.   7. DM type II. Currently on sliding scale.    Family Communication  :  None  Code Status :  Full  Diet : Healthy low carbohydrate  Disposition Plan  :  Stay Inpt  Consults  :  W care  Procedures  :   ABI  DVT Prophylaxis  :  Lovenox    Lab Results  Component Value Date   PLT 226 01/12/2016    Inpatient Medications  Scheduled Meds: . allopurinol  300 mg Oral Daily  . aspirin  325 mg Oral Daily  . atenolol  25 mg Oral Daily  . enoxaparin (LOVENOX) injection  80 mg Subcutaneous Q24H  . gabapentin  300 mg Oral BID  . hydrocerin   Topical Daily  . insulin aspart  0-9 Units Subcutaneous TID WC  . magnesium gluconate  500 mg Oral BID  . NIFEdipine  90 mg Oral Daily  . piperacillin-tazobactam (ZOSYN)  IV  3.375 g Intravenous Q8H  . vancomycin  1,250 mg Intravenous Q12H   Continuous Infusions: . sodium chloride     PRN Meds:.acetaminophen **OR** [DISCONTINUED] acetaminophen, albuterol, clonazePAM, cyclobenzaprine, HYDROcodone-acetaminophen, magnesium citrate, morphine injection, ondansetron **OR** ondansetron (ZOFRAN) IV, senna-docusate, traZODone  Antibiotics  :    Anti-infectives    Start     Dose/Rate Route Frequency Ordered Stop   01/11/16 2100  vancomycin (VANCOCIN) 1,250 mg in sodium chloride 0.9 % 250 mL IVPB       1,250 mg 166.7 mL/hr over 90 Minutes Intravenous Every 12 hours 01/11/16 0854     01/11/16 1700  piperacillin-tazobactam (ZOSYN) IVPB 3.375 g     3.375 g 12.5 mL/hr over 240 Minutes Intravenous Every 8 hours 01/11/16 1044     01/11/16 1045  piperacillin-tazobactam (ZOSYN) IVPB 3.375 g     3.375 g 100 mL/hr over 30 Minutes Intravenous  Once 01/11/16 1043 01/11/16 1156   01/11/16 0815  vancomycin (VANCOCIN) 2,000 mg in sodium chloride 0.9 % 500 mL IVPB     2,000 mg 250 mL/hr over 120 Minutes Intravenous  Once 01/11/16 0813 01/11/16 1059         Objective:   Vitals:   01/11/16 2100 01/12/16 0009 01/12/16 0443 01/12/16 0630  BP: 130/79  107/70 117/71  Pulse: 98  94 88  Resp: Temp: 99.8 F (37.7 C) 100.1 F (37.8 C) 99.5 F (37.5 C) 99.4 F (37.4 C)  TempSrc: Oral Oral Oral Oral  SpO2: 97%  94% 98%  Weight:      Height:        Wt Readings from Last 3 Encounters:  01/11/16 (!) 159.4 kg (351 lb 6.6 oz)     Intake/Output Summary (Last 24 hours) at 01/12/16 1114 Last data filed at 01/12/16 0600  Gross per 24 hour  Intake             1895 ml  Output                0 ml  Net             1895 ml     Physical Exam  Awake Alert, Oriented X 3, No new F.N deficits, Normal affect [place].AT,PERRAL Supple Neck,No JVD, No cervical lymphadenopathy appriciated.  Symmetrical Chest wall movement, Good air movement bilaterally, CTAB RRR,No Gallops,Rubs or new Murmurs, No Parasternal Heave +ve B.Sounds, Abd Soft, No tenderness, No organomegaly appriciated, No rebound - guarding or rigidity. 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     Data Review:    CBC  Recent Labs Lab 01/11/16 0815 01/12/16 0317  WBC 19.1* 10.1  HGB 13.3 11.9*  HCT 40.2 36.6  PLT 248 226  MCV 91.2 91.7  MCH 30.2 29.8  MCHC 33.1 32.5  RDW 15.2 15.5  LYMPHSABS 1.9   --   MONOABS 0.8  --   EOSABS 0.1  --   BASOSABS 0.0  --     Chemistries   Recent Labs Lab 01/11/16 0815 01/12/16 0317  NA 133* 135  K 3.2* 3.5  CL 102 107  CO2 22 23  GLUCOSE 186* 196*  BUN 11 8  CREATININE 1.03* 0.89  CALCIUM 9.0 8.3*  AST 19 16  ALT 16 15  ALKPHOS 105 90  BILITOT 0.6 0.8   ------------------------------------------------------------------------------------------------------------------ No results for input(s): CHOL, HDL, LDLCALC, TRIG, CHOLHDL, LDLDIRECT in the last 72 hours.  Lab Results  Component Value Date   HGBA1C 9.3 (H) 01/11/2016   ------------------------------------------------------------------------------------------------------------------ No results for input(s): TSH, T4TOTAL, T3FREE, THYROIDAB in the last 72 hours.  Invalid input(s): FREET3 ------------------------------------------------------------------------------------------------------------------ No results for input(s): VITAMINB12, FOLATE, FERRITIN, TIBC, IRON, RETICCTPCT in the last 72 hours.  Coagulation profile No results for input(s): INR, PROTIME in the last 168 hours.  No results for input(s): DDIMER in the last 72 hours.  Cardiac Enzymes No results for input(s): CKMB, TROPONINI, MYOGLOBIN in the last 168 hours.  Invalid input(s): CK ------------------------------------------------------------------------------------------------------------------ No results found for: BNP  Micro Results No results found for this or any previous visit (from the past 240 hour(s)).  Radiology Reports No results found.  Time Spent in minutes  30   SINGH,PRASHANT K M.D on 01/12/2016 at 11:14 AM  Between 7am to 7pm - Pager - [phone]  After 7pm go to [email or web address] - password [facility]  [facility] -  Office  [phone]

## 2016-01-12 NOTE — Evaluation (Signed)
Physical Therapy Evaluation Patient Details Name: [name] MRN: [id number] DOB: Jul 09, 1950 Today's Date: 01/12/2016   History of Present Illness  pt presents with L LE Cellulitis.  pt with hx of DM, Neuropathy, HTN, and TIA.    Clinical Impression  Pt seems to be near her baseline level of transfers with only limitation being pain in L foot during transfer.  Discussed with pt continuing to get to/from bed, chair, and 3-in-1 with nsg A to make sure she doesn't regress while in hospital.  Will check back on pt another time to ensure safety for return to home.      Follow Up Recommendations Home health PT;Supervision - Intermittent    Equipment Recommendations  None recommended by PT    Recommendations for Other Services       Precautions / Restrictions Precautions Precautions: Fall Restrictions Weight Bearing Restrictions: No      Mobility  Bed Mobility Overal bed mobility: Needs Assistance Bed Mobility: Supine to Sit     Supine to sit: Min guard;HOB elevated     General bed mobility comments: pt uses bed rails to A with bringing herself around to sitting, but no physical A needed.    Transfers Overall transfer level: Needs assistance Equipment used: None Transfers: Sit to/from [employer] Sit to Stand: Supervision   Squat pivot transfers: Supervision     General transfer comment: pt does an excellent job of telling PT where she needs chair placed and then is able to talk PT through how she uses her UEs to come to semi-standing and then perform squat pivot to recliner.    Ambulation/Gait                Stairs            Wheelchair Mobility    Modified Rankin (Stroke Patients Only)       Balance Overall balance assessment: Needs assistance Sitting-balance support: No upper extremity supported;Feet supported Sitting balance-Leahy Scale: Good     Standing balance support: Bilateral upper extremity supported;During  functional activity Standing balance-Leahy Scale: Poor                               Pertinent Vitals/Pain Pain Assessment: 0-10 Pain Score: 6  Pain Location: L foot Pain Descriptors / Indicators: Burning Pain Intervention(s): Monitored during session;Premedicated before session;Repositioned    Home Living Family/patient expects to be discharged to:: Private residence Living Arrangements: Spouse/significant other Available Help at Discharge: Family;Available 24 hours/day Type of Home: Apartment Home Access: Level entry     Home Layout: One level Home Equipment: Walker - 2 wheels;Wheelchair - Engineer, technical sales - power;Bedside commode      Prior Function Level of Independence: Needs assistance   Gait / Transfers Assistance Needed: pt only transfers at baseline.    ADL's / Homemaking Assistance Needed: pt able to perform grooming tasks, but needs A for other ADLs and all homemaking tasks.          Hand Dominance        Extremity/Trunk Assessment   Upper Extremity Assessment: Overall WFL for tasks assessed           Lower Extremity Assessment: Generalized weakness;LLE deficits/detail   LLE Deficits / Details: pt with diminished sensation from Neuropathies and now with burning sensation from cellulitis.  pt is generally weak, but with ROM WFL.    Cervical / Trunk Assessment: Normal  Communication   Communication: No  difficulties  Cognition Arousal/Alertness: Awake/alert Behavior During Therapy: WFL for tasks assessed/performed Overall Cognitive Status: Within Functional Limits for tasks assessed                      General Comments      Exercises        Assessment/Plan    PT Assessment Patient needs continued PT services  PT Diagnosis Generalized weakness   PT Problem List Decreased strength;Decreased activity tolerance;Decreased balance;Decreased mobility;Decreased knowledge of use of DME;Obesity;Pain;Impaired sensation  PT  Treatment Interventions DME instruction;Functional mobility training;Therapeutic activities;Therapeutic exercise;Balance training;Patient/family education   PT Goals (Current goals can be found in the Care Plan section) Acute Rehab PT Goals Patient Stated Goal: Pain to go away. PT Goal Formulation: With patient Time For Goal Achievement: 01/19/16 Potential to Achieve Goals: Good    Frequency Min 2X/week   Barriers to discharge        Co-evaluation               End of Session   Activity Tolerance: Patient tolerated treatment well Patient left: in chair;with call bell/phone within reach Nurse Communication: Mobility status         Time: 2130-8657 PT Time Calculation (min) (ACUTE ONLY): 14 min   Charges:   PT Evaluation $PT Eval Low Complexity: 1 Procedure     PT G CodesSunny Schlein, [place] [phone] 01/12/2016, 2:58 PM

## 2016-01-13 ENCOUNTER — Inpatient Hospital Stay (HOSPITAL_COMMUNITY): Payer: Medicare PPO

## 2016-01-13 DIAGNOSIS — M79609 Pain in unspecified limb: Secondary | ICD-10-CM

## 2016-01-13 LAB — CBC
HEMATOCRIT: 38.1 % (ref 36.0–46.0)
Hemoglobin: 12.2 g/dL (ref 12.0–15.0)
MCH: 29.3 pg (ref 26.0–34.0)
MCHC: 32 g/dL (ref 30.0–36.0)
MCV: 91.6 fL (ref 78.0–100.0)
Platelets: 241 10*3/uL (ref 150–400)
RBC: 4.16 MIL/uL (ref 3.87–5.11)
RDW: 15.4 % (ref 11.5–15.5)
WBC: 9.8 10*3/uL (ref 4.0–10.5)

## 2016-01-13 LAB — GLUCOSE, CAPILLARY
GLUCOSE-CAPILLARY: 248 mg/dL — AB (ref 65–99)
Glucose-Capillary: 266 mg/dL — ABNORMAL HIGH (ref 65–99)
Glucose-Capillary: 231 mg/dL — ABNORMAL HIGH (ref 65–99)
Glucose-Capillary: 216 mg/dL — ABNORMAL HIGH (ref 65–99)

## 2016-01-13 LAB — BASIC METABOLIC PANEL
Anion gap: 6 (ref 5–15)
BUN: 7 mg/dL (ref 6–20)
CALCIUM: 8.9 mg/dL (ref 8.9–10.3)
CO2: 25 mmol/L (ref 22–32)
Chloride: 105 mmol/L (ref 101–111)
Creatinine, Ser: 0.9 mg/dL (ref 0.44–1.00)
GFR calc Af Amer: >60 mL/min (ref >60)
GLUCOSE: 250 mg/dL — AB (ref 65–99)
Potassium: 4 mmol/L (ref 3.5–5.1)
Sodium: 136 mmol/L (ref 135–145)

## 2016-01-13 LAB — VANCOMYCIN, TROUGH: Vancomycin Tr: 16 ug/mL (ref 15–20)

## 2016-01-13 NOTE — Progress Notes (Signed)
VASCULAR LAB PRELIMINARY  ARTERIAL  ABI completed:Right ABI is within normal limits.  Unable to ascertain left ABI secondary to significant pain with cuff pressure.  However, waveforms are within normal limits.     RIGHT    LEFT    PRESSURE WAVEFORM  PRESSURE WAVEFORM  BRACHIAL restricted T BRACHIAL 119 T  DP   DP    AT 142  B AT  B  PT 148 B PT  B  PER   PER    GREAT TOE  NA GREAT TOE  NA    RIGHT LEFT  ABI >1      Henry Demeritt, RVT 01/13/2016, 9:31 AM

## 2016-01-13 NOTE — Progress Notes (Signed)
PROGRESS NOTE                                                                                                                                                                                                             Patient Demographics:    [name], is a 65 y.o. female, DOB - 1951/05/28, PFY:[id number]  Admit date - 01/11/2016   Admitting Physician Starleen Arms, MD  Outpatient Primary MD for the patient is No primary care provider on file.  LOS - 2  Chief Complaint  Patient presents with  . Fever       Brief Narrative   [name] is a 65 y.o. female recently moving form [facility], [place] on 7/4 to be with her son,  with medical history significant for DM, HTN, prior TIA, asthma, arthritis, history of lower extremity cellulitis  In September 2016 requiring 1 month hospitalization and wound care follow up until 10/2015, presenting today with 2 day history of left lower extremity swelling, with worsening pain and increasing erythema and warmth since yesterday,She reports that this redness has been spreading over the last 12 hours.. The pain is throbbing, aching and worse with any movement.she denies any take bites or any insect bite. Denies any dog/cat contacts. She does admit to scratching that affected area  When becomes pruritic.   She had spike a fever to 101 yesterday, and felt generally unwell. She was also dehydrated. She denies any nausea or vomiting. She denies any diarrhea. No urinary symptoms. No confusion is reported.   Subjective:    [name] today has, No headache, No chest pain, No abdominal pain - No Nausea, No new weakness tingling or numbness, No Cough - SOB. Is her left leg already feels better.   Assessment  & Plan :     1.Recurrent left lower extremity cellulitis due to underlying venous stasis. She was hospitalized in September at outside hospital for the same reason. She has  history of venous stasis and is likely causing recurrent infection. She also is diabetic. No open wounds or drainage noted. She has been placed on empiric vancomycin and Zosyn with good effect and she feels better, follow cultures, ABIs - limited study but good wave forms. If stable will transition to oral antibiotics in the next 2-3 days with outpatient vascular surgery follow-up. Patient requested  to keep her leg elevated.  2. Morbid obesity. Follow with PCP for weight loss.  3. Diabetic peripheral neuropathy. Continue Neurontin.  4. Gout. On allopurinol.  5. Essential hypertension. Continue beta blocker and Nifedipine .  6. HX of asthma. Stable. Supportive care. No wheezing.   7. DM type II. Currently on sliding scale.    Family Communication  :  None  Code Status :  Full  Diet : Healthy low carbohydrate  Disposition Plan  :  Stay Inpt  Consults  :  W care  Procedures  :   ABI - limited study but good wave forms  DVT Prophylaxis  :  Lovenox    Lab Results  Component Value Date   PLT 241 01/13/2016    Inpatient Medications  Scheduled Meds: . allopurinol  300 mg Oral Daily  . aspirin  325 mg Oral Daily  . atenolol  25 mg Oral Daily  . enoxaparin (LOVENOX) injection  80 mg Subcutaneous Q24H  . gabapentin  300 mg Oral BID  . hydrocerin   Topical Daily  . insulin aspart  0-9 Units Subcutaneous TID WC  . magnesium gluconate  500 mg Oral BID  . NIFEdipine  90 mg Oral Daily  . piperacillin-tazobactam (ZOSYN)  IV  3.375 g Intravenous Q8H  . vancomycin  1,250 mg Intravenous Q12H   Continuous Infusions: . sodium chloride 50 mL/hr at 01/12/16 1223   PRN Meds:.acetaminophen **OR** [DISCONTINUED] acetaminophen, albuterol, clonazePAM, cyclobenzaprine, HYDROcodone-acetaminophen, morphine injection, ondansetron **OR** ondansetron (ZOFRAN) IV, senna-docusate, traZODone  Antibiotics  :    Anti-infectives    Start     Dose/Rate Route Frequency Ordered Stop   01/11/16 2100   vancomycin (VANCOCIN) 1,250 mg in sodium chloride 0.9 % 250 mL IVPB     1,250 mg 166.7 mL/hr over 90 Minutes Intravenous Every 12 hours 01/11/16 0854     01/11/16 1700  piperacillin-tazobactam (ZOSYN) IVPB 3.375 g     3.375 g 12.5 mL/hr over 240 Minutes Intravenous Every 8 hours 01/11/16 1044     01/11/16 1045  piperacillin-tazobactam (ZOSYN) IVPB 3.375 g     3.375 g 100 mL/hr over 30 Minutes Intravenous  Once 01/11/16 1043 01/11/16 1156   01/11/16 0815  vancomycin (VANCOCIN) 2,000 mg in sodium chloride 0.9 % 500 mL IVPB     2,000 mg 250 mL/hr over 120 Minutes Intravenous  Once 01/11/16 0813 01/11/16 1059         Objective:   Vitals:   01/12/16 0630 01/12/16 1300 01/12/16 2110 01/13/16 0601  BP: 117/71 111/62 135/73 136/74  Pulse: 88 78  90  Resp: 20     Temp: 99.4 F (37.4 C) 98 F (36.7 C)  98.5 F (36.9 C)  TempSrc: Oral Oral  Oral  SpO2: 98% 98% 96% 99%  Weight:      Height:        Wt Readings from Last 3 Encounters:  01/11/16 (!) 159.4 kg (351 lb 6.6 oz)     Intake/Output Summary (Last 24 hours) at 01/13/16 0912 Last data filed at 01/13/16 0600  Gross per 24 hour  Intake              550 ml  Output                0 ml  Net              550 ml     Physical Exam  Awake Alert, Oriented X 3, No new F.N  deficits, Normal affect [place].AT,PERRAL Supple Neck,No JVD, No cervical lymphadenopathy appriciated.  Symmetrical Chest wall movement, Good air movement bilaterally, CTAB RRR,No Gallops,Rubs or new Murmurs, No Parasternal Heave +ve B.Sounds, Abd Soft, No tenderness, No organomegaly appriciated, No rebound - guarding or rigidity. 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     Data Review:    CBC  Recent Labs Lab 01/11/16 0815 01/12/16 0317 01/13/16 0442  WBC 19.1* 10.1 9.8  HGB 13.3 11.9* 12.2  HCT 40.2 36.6 38.1  PLT 248 226  241  MCV 91.2 91.7 91.6  MCH 30.2 29.8 29.3  MCHC 33.1 32.5 32.0  RDW 15.2 15.5 15.4  LYMPHSABS 1.9  --   --   MONOABS 0.8  --   --   EOSABS 0.1  --   --   BASOSABS 0.0  --   --     Chemistries   Recent Labs Lab 01/11/16 0815 01/12/16 0317 01/13/16 0442  NA 133* 135 136  K 3.2* 3.5 4.0  CL 102 107 105  CO2 GLUCOSE 186* 196* 250*  BUN CREATININE 1.03* 0.89 0.90  CALCIUM 9.0 8.3* 8.9  AST 19 16  --   ALT 16 15  --   ALKPHOS 105 90  --   BILITOT 0.6 0.8  --    ------------------------------------------------------------------------------------------------------------------ No results for input(s): CHOL, HDL, LDLCALC, TRIG, CHOLHDL, LDLDIRECT in the last 72 hours.  Lab Results  Component Value Date   HGBA1C 9.3 (H) 01/11/2016   ------------------------------------------------------------------------------------------------------------------ No results for input(s): TSH, T4TOTAL, T3FREE, THYROIDAB in the last 72 hours.  Invalid input(s): FREET3 ------------------------------------------------------------------------------------------------------------------ No results for input(s): VITAMINB12, FOLATE, FERRITIN, TIBC, IRON, RETICCTPCT in the last 72 hours.  Coagulation profile No results for input(s): INR, PROTIME in the last 168 hours.  No results for input(s): DDIMER in the last 72 hours.  Cardiac Enzymes No results for input(s): CKMB, TROPONINI, MYOGLOBIN in the last 168 hours.  Invalid input(s): CK ------------------------------------------------------------------------------------------------------------------ No results found for: BNP  Micro Results Recent Results (from the past 240 hour(s))  Blood culture (routine x 2)     Status: None (Preliminary result)   Collection Time: 01/11/16  8:00 AM  Result Value Ref Range Status   Specimen Description BLOOD RIGHT ANTECUBITAL  Final   Special Requests BOTTLES DRAWN AEROBIC AND ANAEROBIC 5CC   Final   Culture NO GROWTH 1 DAY  Final   Report Status PENDING  Incomplete  Blood culture (routine x 2)     Status: None (Preliminary result)   Collection Time: 01/11/16  8:12 AM  Result Value Ref Range Status   Specimen Description BLOOD LEFT ANTECUBITAL  Final   Special Requests BOTTLES DRAWN AEROBIC AND ANAEROBIC 5CC  Final   Culture NO GROWTH 1 DAY  Final   Report Status PENDING  Incomplete    Radiology Reports No results found.  Time Spent in minutes  30   SINGH,PRASHANT K M.D on 01/13/2016 at 9:12 AM  Between 7am to 7pm - Pager - [phone]  After 7pm go to [email or web address] - password [facility]  [facility] -  Office  [phone]

## 2016-01-13 NOTE — Progress Notes (Signed)
Pharmacy Antibiotic Note  [name] is a 65 y.o. female admitted on 01/11/2016 with cellulitis.  Pharmacy has been consulted for vancomycin and zosyn dosing. -Afebrile, WBC WNL -LA 1.73 -SCr 0.90, CrCl 95.1  VT 7/30: 16 -Although slightly above goal of 10-15, no change needed  Plan: Vancomycin 1250 IV every 12 hours.  Goal trough 10-15 mcg/mL.  Continue Zosyn 3.375g IV Q8H (4hr inf) F/u renal fxn, C&S, clinical status, trough as needed  Height: 5\' 5"  (165.1 cm) Weight: (!) 351 lb 6.6 oz (159.4 kg) IBW/kg (Calculated) : 57  Temp (24hrs), Avg:98.3 F (36.8 C), Min:98 F (36.7 C), Max:98.5 F (36.9 C)   Recent Labs Lab 01/11/16 0815 01/11/16 0820 01/12/16 0317 01/13/16 0442 01/13/16 0827  WBC 19.1*  --  10.1 9.8  --   CREATININE 1.03*  --  0.89 0.90  --   LATICACIDVEN  --  1.73  --   --   --   VANCOTROUGH  --   --   --   --  16    Estimated Creatinine Clearance: 95.1 mL/min (by C-G formula based on SCr of 0.9 mg/dL).    Allergies  Allergen Reactions  . Ace Inhibitors Swelling  . Metformin And Related Other (See Comments)    chills    Antimicrobials this admission: Vanc 7/28>> Zosyn 7/28>>  Dose adjustments this admission: N/A  Microbiology results: 7/28 BCx x2: ngtd  Thank you for allowing pharmacy to be a part of this patient's care.  Gwyndolyn Kaufman Bernette Redbird), PharmD  PGY1 Pharmacy Resident Pager: [phone] 01/13/2016 10:05 AM

## 2016-01-14 LAB — GLUCOSE, CAPILLARY
Glucose-Capillary: 211 mg/dL — ABNORMAL HIGH (ref 65–99)
Glucose-Capillary: 239 mg/dL — ABNORMAL HIGH (ref 65–99)
Glucose-Capillary: 194 mg/dL — ABNORMAL HIGH (ref 65–99)

## 2016-01-14 MED ORDER — DOXYCYCLINE HYCLATE 100 MG PO TABS
100.0000 mg | ORAL_TABLET | Freq: Two times a day (BID) | ORAL | Status: DC
  Administered 2016-01-15: 100 mg via ORAL
  Filled 2016-01-14: qty 1

## 2016-01-14 MED ORDER — AMOXICILLIN-POT CLAVULANATE 875-125 MG PO TABS
1.0000 | ORAL_TABLET | Freq: Two times a day (BID) | ORAL | Status: DC
  Administered 2016-01-15: 1 via ORAL
  Filled 2016-01-14: qty 1

## 2016-01-14 NOTE — Progress Notes (Signed)
Inpatient Diabetes Program Recommendations  AACE/ADA: New Consensus Statement on Inpatient Glycemic Control (2015)  Target Ranges:  Prepandial:   less than 140 mg/dL      Peak postprandial:   less than 180 mg/dL (1-2 hours)      Critically ill patients:  140 - 180 mg/dL  Results for [name], [name] (MRN [id number]) as of 01/14/2016 10:16  Ref. Range 01/13/2016 05:52 01/13/2016 10:32 01/13/2016 16:38 01/13/2016 21:27 01/14/2016 06:12  Glucose-Capillary Latest Ref Range: 65 - 99 mg/dL 758 (H) 832 (H) 549 (H) 216 (H) 211 (H)   Results for [name], [name] (MRN [id number]) as of 01/14/2016 10:16  Ref. Range 01/11/2016 11:21  Hemoglobin A1C Latest Ref Range: 4.8 - 5.6 % 9.3 (H)    Review of Glycemic Control  Diabetes history: DM2 Outpatient Diabetes medications: Tresiba 75 units qd + Apidra 10-15 units bid meal coverage Current orders for Inpatient glycemic control: Novolog correction 0-9 units tid  Inpatient Diabetes Program Recommendations:  Please consider starting 50% of home basal dose Lantus 40 units qd + meal coverage when eats > 50% Novolog 4 units tid.  Thank you, Billy Fischer. Lusero Nordlund, RN, MSN, CDE Inpatient Glycemic Control Team Team Pager [phone] (8am-5pm) 01/14/2016 10:23 AM

## 2016-01-14 NOTE — Progress Notes (Signed)
Physical Therapy Discharge Patient Details Name: [name] MRN: [id number] DOB: 04-03-1951 Today's Date: 01/14/2016 Time: 7262-0355 PT Time Calculation (min) (ACUTE ONLY): 12 min  Patient discharged from PT services secondary to meeting all goals and performing at a MOD I level.    Please see latest therapy progress note for current level of functioning and progress toward goals.    Progress and discharge plan discussed with patient and/or caregiver: 01/14/16.  Pt is agreement and reports she feels at her baseline for function.     Shloime Keilman Artis Delay 01/14/2016, 3:17 PM   Joycelyn Rua, PTA pager [phone]

## 2016-01-14 NOTE — Progress Notes (Signed)
PROGRESS NOTE                                                                                                                                                                                                             Patient Demographics:    [name], is a 65 y.o. female, DOB - 08-19-1950, BTD:[id number]  Admit date - 01/11/2016   Admitting Physician Starleen Arms, MD  Outpatient Primary MD for the patient is No primary care provider on file.  LOS - 3  Chief Complaint  Patient presents with  . Fever       Brief Narrative   [name] is a 65 y.o. female recently moving form [facility], [place] on 7/4 to be with her son,  with medical history significant for DM, HTN, prior TIA, asthma, arthritis, history of lower extremity cellulitis  In September 2016 requiring 1 month hospitalization and wound care follow up until 10/2015, presenting today with 2 day history of left lower extremity swelling, with worsening pain and increasing erythema and warmth since yesterday,She reports that this redness has been spreading over the last 12 hours.. The pain is throbbing, aching and worse with any movement.she denies any take bites or any insect bite. Denies any dog/cat contacts. She does admit to scratching that affected area  When becomes pruritic.   She had spike a fever to 101 yesterday, and felt generally unwell. She was also dehydrated. She denies any nausea or vomiting. She denies any diarrhea. No urinary symptoms. No confusion is reported.   Subjective:    [name] today has, No headache, No chest pain, No abdominal pain - No Nausea, No new weakness tingling or numbness, No Cough - SOB. Is her left leg already feels better.   Assessment  & Plan :     1.Recurrent left lower extremity cellulitis due to underlying venous stasis. She was hospitalized in September at outside hospital for the same reason. She has  history of venous stasis and is likely causing recurrent infection. She also is diabetic. No open wounds or drainage noted. She has been placed on empiric vancomycin and Zosyn with good effect and she feels better, follow cultures, ABIs - limited study but good wave forms. Continues to get better clinically will transition to oral antibiotics on 01/15/2016 with outpatient vascular surgery follow-up. Patient has  been requested to keep her leg elevated.  2. Morbid obesity. Follow with PCP for weight loss.  3. Diabetic peripheral neuropathy. Continue Neurontin.  4. Gout. On allopurinol.  5. Essential hypertension. Continue beta blocker and Nifedipine .  6. HX of asthma. Stable. Supportive care. No wheezing.   7. DM type II. Currently on sliding scale.  CBG (last 3)   Recent Labs  01/13/16 1638 01/13/16 2127 01/14/16 0612  GLUCAP 231* 216* 211*       Family Communication  :  None  Code Status :  Full  Diet : Heart Healthy low carbohydrate  Disposition Plan  :  Stay Inpt  Consults  :  W care  Procedures  :   ABI - limited study but good wave forms  ABI completed:Right ABI is within normal limits.  Unable to ascertain left ABI secondary to significant pain with cuff pressure.  However, waveforms are within normal limits.   DVT Prophylaxis  :  Lovenox    Lab Results  Component Value Date   PLT 241 01/13/2016    Inpatient Medications  Scheduled Meds: . allopurinol  300 mg Oral Daily  . aspirin  325 mg Oral Daily  . atenolol  25 mg Oral Daily  . enoxaparin (LOVENOX) injection  80 mg Subcutaneous Q24H  . gabapentin  300 mg Oral BID  . hydrocerin   Topical Daily  . insulin aspart  0-9 Units Subcutaneous TID WC  . magnesium gluconate  500 mg Oral BID  . NIFEdipine  90 mg Oral Daily  . piperacillin-tazobactam (ZOSYN)  IV  3.375 g Intravenous Q8H  . vancomycin  1,250 mg Intravenous Q12H   Continuous Infusions:   PRN Meds:.acetaminophen **OR** [DISCONTINUED]  acetaminophen, albuterol, clonazePAM, cyclobenzaprine, HYDROcodone-acetaminophen, morphine injection, ondansetron **OR** ondansetron (ZOFRAN) IV, senna-docusate, traZODone  Antibiotics  :    Anti-infectives    Start     Dose/Rate Route Frequency Ordered Stop   01/11/16 2100  vancomycin (VANCOCIN) 1,250 mg in sodium chloride 0.9 % 250 mL IVPB     1,250 mg 166.7 mL/hr over 90 Minutes Intravenous Every 12 hours 01/11/16 0854     01/11/16 1700  piperacillin-tazobactam (ZOSYN) IVPB 3.375 g     3.375 g 12.5 mL/hr over 240 Minutes Intravenous Every 8 hours 01/11/16 1044     01/11/16 1045  piperacillin-tazobactam (ZOSYN) IVPB 3.375 g     3.375 g 100 mL/hr over 30 Minutes Intravenous  Once 01/11/16 1043 01/11/16 1156   01/11/16 0815  vancomycin (VANCOCIN) 2,000 mg in sodium chloride 0.9 % 500 mL IVPB     2,000 mg 250 mL/hr over 120 Minutes Intravenous  Once 01/11/16 0813 01/11/16 1059         Objective:   Vitals:   01/12/16 2110 01/13/16 0601 01/13/16 1500 01/14/16 0608  BP: 135/73 136/74 128/62 (!) 141/83  Pulse:  90 81 87  Resp:    18  Temp:  98.5 F (36.9 C) 99.1 F (37.3 C) 98.4 F (36.9 C)  TempSrc:  Oral Oral Oral  SpO2: 96% 99% 100% 97%  Weight:      Height:        Wt Readings from Last 3 Encounters:  01/11/16 (!) 159.4 kg (351 lb 6.6 oz)     Intake/Output Summary (Last 24 hours) at 01/14/16 0757 Last data filed at 01/13/16 1800  Gross per 24 hour  Intake              420 ml  Output  0 ml  Net              420 ml     Physical Exam  Awake Alert, Oriented X 3, No new F.N deficits, Normal affect [place].AT,PERRAL Supple Neck,No JVD, No cervical lymphadenopathy appriciated.  Symmetrical Chest wall movement, Good air movement bilaterally, CTAB RRR,No Gallops,Rubs or new Murmurs, No Parasternal Heave +ve B.Sounds, Abd Soft, No tenderness, No organomegaly appriciated, No rebound - guarding or rigidity. 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     Data Review:    CBC  Recent Labs Lab 01/11/16 0815 01/12/16 0317 01/13/16 0442  WBC 19.1* 10.1 9.8  HGB 13.3 11.9* 12.2  HCT 40.2 36.6 38.1  PLT 248 226 241  MCV 91.2 91.7 91.6  MCH 30.2 29.8 29.3  MCHC 33.1 32.5 32.0  RDW 15.2 15.5 15.4  LYMPHSABS 1.9  --   --   MONOABS 0.8  --   --   EOSABS 0.1  --   --   BASOSABS 0.0  --   --     Chemistries   Recent Labs Lab 01/11/16 0815 01/12/16 0317 01/13/16 0442  NA 133* 135 136  K 3.2* 3.5 4.0  CL 102 107 105  CO2 22 23 25   GLUCOSE 186* 196* 250*  BUN 11 8 7   CREATININE 1.03* 0.89 0.90  CALCIUM 9.0 8.3* 8.9  AST 19 16  --   ALT 16 15  --   ALKPHOS 105 90  --   BILITOT 0.6 0.8  --    ------------------------------------------------------------------------------------------------------------------ No results for input(s): CHOL, HDL, LDLCALC, TRIG, CHOLHDL, LDLDIRECT in the last 72 hours.  Lab Results  Component Value Date   HGBA1C 9.3 (H) 01/11/2016   ------------------------------------------------------------------------------------------------------------------ No results for input(s): TSH, T4TOTAL, T3FREE, THYROIDAB in the last 72 hours.  Invalid input(s): FREET3 ------------------------------------------------------------------------------------------------------------------ No results for input(s): VITAMINB12, FOLATE, FERRITIN, TIBC, IRON, RETICCTPCT in the last 72 hours.  Coagulation profile No results for input(s): INR, PROTIME in the last 168 hours.  No results for input(s): DDIMER in the last 72 hours.  Cardiac Enzymes No results for input(s): CKMB, TROPONINI, MYOGLOBIN in the last 168 hours.  Invalid input(s): CK ------------------------------------------------------------------------------------------------------------------ No results found for: BNP  Micro  Results Recent Results (from the past 240 hour(s))  Blood culture (routine x 2)     Status: None (Preliminary result)   Collection Time: 01/11/16  8:00 AM  Result Value Ref Range Status   Specimen Description BLOOD RIGHT ANTECUBITAL  Final   Special Requests BOTTLES DRAWN AEROBIC AND ANAEROBIC 5CC  Final   Culture NO GROWTH 2 DAYS  Final   Report Status PENDING  Incomplete  Blood culture (routine x 2)     Status: None (Preliminary result)   Collection Time: 01/11/16  8:12 AM  Result Value Ref Range Status   Specimen Description BLOOD LEFT ANTECUBITAL  Final   Special Requests BOTTLES DRAWN AEROBIC AND ANAEROBIC 5CC  Final   Culture NO GROWTH 2 DAYS  Final   Report Status PENDING  Incomplete    Radiology Reports No results found.  Time Spent in minutes  30   SINGH,PRASHANT K M.D on 01/14/2016 at 7:57 AM  Between 7am to 7pm - Pager - [phone]  After 7pm go to [email or web address] - password [facility]  [facility] -  Office  [phone]

## 2016-01-14 NOTE — Care Management Important Message (Signed)
Important Message  Patient Details  Name: [name] MRN: [id number] Date of Birth: [birth year]   Medicare Important Message Given:  Yes    Bernadette Hoit 01/14/2016, 3:34 PM

## 2016-01-14 NOTE — Progress Notes (Signed)
Physical Therapy Treatment Patient Details Name: [name] MRN: [id number] DOB: 01-Oct-1950 Today's Date: 01/14/2016    History of Present Illness pt presents with L LE Cellulitis.  pt with hx of DM, Neuropathy, HTN, and TIA.      PT Comments    Pt progressing well and able to demonstrate progress and ability to perform transfers independently at this time.  Pt safe and did not require cueing.  Will inform supervising PT to sign off at this time as all goals are met.  Pt is transferring in room independently.     Follow Up Recommendations  No PT follow up     Equipment Recommendations  None recommended by PT    Recommendations for Other Services       Precautions / Restrictions Precautions Precautions: Fall Restrictions Weight Bearing Restrictions: No    Mobility  Bed Mobility Overal bed mobility: Modified Independent Bed Mobility: Supine to Sit     Supine to sit: Modified independent (Device/Increase time)     General bed mobility comments: Pt in long sitting and able to advance LEs to edge of bed unassisted without cueing.    Transfers Overall transfer level: Needs assistance Equipment used: None Transfers: Sit to/from [facility] Sit to Stand: Modified independent (Device/Increase time) Stand pivot transfers: Modified independent (Device/Increase time) Squat pivot transfers: Modified independent (Device/Increase time)     General transfer comment: Pt remains to talk through process of stand pivot transfers.  Pt performed transfer from bed to 3:1 and back to bed.  Pt used good hand placement and presents with good safety awareness.    Ambulation/Gait                 Stairs            Wheelchair Mobility    Modified Rankin (Stroke Patients Only)       Balance     Sitting balance-Leahy Scale: Good       Standing balance-Leahy Scale: Good                      Cognition Arousal/Alertness:  Awake/alert Behavior During Therapy: WFL for tasks assessed/performed Overall Cognitive Status: Within Functional Limits for tasks assessed                      Exercises      General Comments        Pertinent Vitals/Pain Pain Assessment: Faces Faces Pain Scale: Hurts a little bit Pain Location: L foot Pain Descriptors / Indicators: Discomfort Pain Intervention(s): Monitored during session    Home Living                      Prior Function            PT Goals (current goals can now be found in the care plan section) Acute Rehab PT Goals Patient Stated Goal: Pain to go away. Potential to Achieve Goals: Good Progress towards PT goals: Goals met/education completed, patient discharged from PT    Frequency  Min 2X/week    PT Plan Other (comment) (goals met will d/c patient from PT services.  )    Co-evaluation             End of Session   Activity Tolerance: Patient tolerated treatment well Patient left: in bed;with call bell/phone within reach     Time: 1453-1505 PT Time Calculation (min) (ACUTE ONLY): 12 min  Charges:  $  Therapeutic Activity: 8-22 mins                    G Codes:      Cristela Blue 28-Jan-2016, 3:15 PM  Governor Rooks, PTA pager [phone]

## 2016-01-15 LAB — GLUCOSE, CAPILLARY
GLUCOSE-CAPILLARY: 238 mg/dL — AB (ref 65–99)
Glucose-Capillary: 223 mg/dL — ABNORMAL HIGH (ref 65–99)

## 2016-01-15 MED ORDER — HYDROCERIN EX CREA: 1.0000 "application " | TOPICAL_CREAM | Freq: Every day | CUTANEOUS | 0 refills | Status: DC

## 2016-01-15 MED ORDER — DOXYCYCLINE HYCLATE 100 MG PO TABS: 100.0000 mg | ORAL_TABLET | Freq: Two times a day (BID) | ORAL | 0 refills | Status: DC

## 2016-01-15 MED ORDER — AMOXICILLIN-POT CLAVULANATE 875-125 MG PO TABS: 1.0000 | ORAL_TABLET | Freq: Two times a day (BID) | ORAL | 0 refills | Status: DC

## 2016-01-15 NOTE — Care Management Note (Signed)
Case Management Note  Patient Details  Name: [name] MRN: [id number] Date of Birth: [birth year]  Subjective/Objective:    65 yr old female admitted with cellulitis of lower extremities.                Action/Plan: Case manager spoke with patient concerning home health needs. Choice was offered for Home Health agency. Referral was called to Frazier Butt, [facility] Liaison. Patient states she has a motorized wheelchair at home and has her standard wheelchair here with her. She will have family support at discharge.   Expected Discharge Date:  01/15/16               Expected Discharge Plan:  Home w Home Health Services  In-House Referral:  NA  Discharge planning Services  CM Consult  Post Acute Care Choice:  Home Health Choice offered to:  Patient  DME Arranged:  N/A DME Agency:  NA  HH Arranged:  PT HH Agency:  [facility]  Status of Service:  Completed, signed off  If discussed at Long Length of Stay Meetings, dates discussed:    Additional Comments:  Durenda Guthrie, RN 01/15/2016, 11:21 AM

## 2016-01-15 NOTE — Discharge Summary (Signed)
[name] ZOX:[id number] DOB: 01-02-1951 DOA: 01/11/2016  PCP: No primary care provider on file.  Admit date: 01/11/2016  Discharge date: 01/15/2016  Admitted From: Home   Disposition:  Home   Recommendations for Outpatient Follow-up:   Follow up with PCP in 1-2 weeks  PCP Please obtain BMP/CBC, 2 view CXR in 1week,  (see Discharge instructions)   PCP Please follow up on the following pending results: None   Home Health: RN, PT Equipment/Devices: None Consultations: None Discharge Condition: Stable   CODE STATUS: Full   Diet Recommendation: Heart Healthy Low Carb   Chief Complaint  Patient presents with  . Fever     Brief history of present illness from the day of admission and additional interim summary    [name] Dingleis a 65 y.o.femalerecently moving [facility], [place] on 7/4 to be with her son,with medical history significant for DM, HTN, prior TIA, asthma, arthritis, history of lower extremity cellulitis In September 2016 requiring 1 month hospitalization and wound care follow up until 10/2015, presenting today with 2 day history of left lower extremity swelling, with worsening pain and increasing erythema and warmth since yesterday,She reports that this redness has been spreading over the last 12 hours.. The pain is throbbing, aching and worse with any movement.she denies any take bites or any insect bite. Denies any dog/cat contacts. She does admit to scratching that affected area When becomes pruritic.   She had spike a fever to 101 yesterday, and felt generally unwell. She was also dehydrated. She denies any nausea or vomiting. She denies any diarrhea. No urinary symptoms. No confusion is reported.  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 She was hospitalized in September at outside hospital for the same reason. She has history of venous stasis and is likely causing recurrent infection. She also is diabetic. No open wounds or drainage noted. She has been placed on empiric vancomycin and Zosyn with good effect and she feels better, negative cultures, ABIs - limited study but good wave forms.   Now back to baseline, will place on 10 more days of PO ABX, she recently failed PO RX in [place], will have her follow with Vas Surg locally here for venous stasis related recurrent cellulitis. Patient has been requested to keep her leg elevated and her her old medical records for local MDs. Also set up at local [facility] center.  2. Morbid obesity. Follow with PCP for weight loss.  3. Diabetic peripheral neuropathy. Continue Neurontin.  4. Gout. On allopurinol.  5. Essential hypertension. Continue beta blocker and Nifedipine .  6. HX of asthma. Stable.  No wheezing.   7. DM type II. Continue Home Rx follow with PCP.  Lab Results  Component Value Date   HGBA1C 9.3 (H) 01/11/2016      Discharge diagnosis     Principal Problem:   Cellulitis of left leg Active Problems:   DM (diabetes mellitus) (HCC)   Hypertension  Discharge instructions    Discharge Instructions    Diet - low sodium heart healthy    Complete by:  As directed   Discharge instructions    Complete by:  As directed   Follow with Primary MD  in 7 days , keep your left leg elevated  Get CBC, CMP, 2 view Chest X ray checked  by Primary MD or SNF MD in 5-7 days ( we routinely change or add medications that can affect your baseline labs and fluid status, therefore we recommend that you get the mentioned basic workup next visit with your PCP, your PCP may decide not to get them or add new tests based on their clinical decision)   Activity: As tolerated with Full fall precautions use walker/cane & assistance as  needed   Disposition Home    Diet:   Heart Healthy low Carb and 1.5 lit/day fluid restriction.  Accuchecks 4 times/day, Once in AM empty stomach and then before each meal. Log in all results and show them to your Prim.MD in 3 days. If any glucose reading is under 80 or above 300 call your Prim MD immidiately. Follow Low glucose instructions for glucose under 80 as instructed.    On your next visit with your primary care physician please Get Medicines reviewed and adjusted.   Please request your Prim.MD to go over all Hospital Tests and Procedure/Radiological results at the follow up, please get all Hospital records sent to your Prim MD by signing hospital release before you go home.   If you experience worsening of your admission symptoms, develop shortness of breath, life threatening emergency, suicidal or homicidal thoughts you must seek medical attention immediately by calling 911 or calling your MD immediately  if symptoms less severe.  You Must read complete instructions/literature along with all the possible adverse reactions/side effects for all the Medicines you take and that have been prescribed to you. Take any new Medicines after you have completely understood and accpet all the possible adverse reactions/side effects.   Do not drive, operate heavy machinery, perform activities at heights, swimming or participation in water activities or provide baby sitting services if your were admitted for syncope or siezures until you have seen by Primary MD or a Neurologist and advised to do so again.  Do not drive when taking Pain medications.    Do not take more than prescribed Pain, Sleep and Anxiety Medications  Special Instructions: If you have smoked or chewed Tobacco  in the last 2 yrs please stop smoking, stop any regular Alcohol  and or any Recreational drug use.  Wear Seat belts while driving.   Please note  You were cared for by a hospitalist during your hospital  stay. If you have any questions about your discharge medications or the care you received while you were in the hospital after you are discharged, you can call the unit and asked to speak with the hospitalist on call if the hospitalist that took care of you is not available. Once you are discharged, your primary care physician will handle any further medical issues. Please note that NO REFILLS for any discharge medications will be authorized once you are discharged, as it is imperative that you return to your primary care physician (or establish a relationship with a primary care physician if you do not have one) for your aftercare needs so that they can reassess your need for medications and monitor your lab values.   Increase activity slowly  Complete by:  As directed      Discharge Medications     Medication List    TAKE these medications   albuterol 108 (90 Base) MCG/ACT inhaler Commonly known as:  PROVENTIL HFA;VENTOLIN HFA Inhale 2 puffs into the lungs every 6 (six) hours as needed for wheezing or shortness of breath.   allopurinol 300 MG tablet Commonly known as:  ZYLOPRIM Take 300 mg by mouth daily.   amoxicillin-clavulanate 875-125 MG tablet Commonly known as:  AUGMENTIN Take 1 tablet by mouth every 12 (twelve) hours.   APIDRA 100 UNIT/ML injection Generic drug:  insulin glulisine Inject 10-15 Units into the skin 2 (two) times daily. <160 take 10 units  >161 take 15 units   aspirin 325 MG tablet Take 325 mg by mouth daily.   atenolol 25 MG tablet Commonly known as:  TENORMIN Take 25-50 mg by mouth daily. Take 50 mg every morning  Take 25 mg every evening   clonazePAM 0.5 MG tablet Commonly known as:  KLONOPIN Take 0.5 mg by mouth daily as needed for anxiety.   cyclobenzaprine 10 MG tablet Commonly known as:  FLEXERIL Take 10 mg by mouth 3 (three) times daily as needed for muscle spasms.   doxycycline 100 MG tablet Commonly known as:  VIBRA-TABS Take 1 tablet  (100 mg total) by mouth every 12 (twelve) hours.   gabapentin 300 MG capsule Commonly known as:  NEURONTIN Take 300 mg by mouth 2 (two) times daily.   hydrocerin Crea Apply 1 application topically daily.   HYDROcodone-acetaminophen 10-325 MG tablet Commonly known as:  NORCO Take 1 tablet by mouth every 6 (six) hours as needed for moderate pain.   magnesium gluconate 500 MG tablet Commonly known as:  MAGONATE Take 500 mg by mouth 2 (two) times daily.   NIFEdipine 90 MG 24 hr tablet Commonly known as:  PROCARDIA XL/ADALAT-CC Take 90 mg by mouth daily.   TRESIBA FLEXTOUCH 100 UNIT/ML Sopn Generic drug:  Insulin Degludec Inject 75 Units into the skin daily.       Follow-up Information    [facility]. Schedule an appointment as soon as possible for a visit in 1 week(s).   Contact information: [address] [place] [address] [phone]       Waverly Ferrari, MD. Schedule an appointment as soon as possible for a visit in 1 week(s).   Specialties:  Vascular Surgery, Cardiology Why:  Venous stasis Contact information: [address] [place] [address] [phone]           Major procedures and Radiology Reports - PLEASE review detailed and final reports thoroughly  -        No results found.  Micro Results    Recent Results (from the past 240 hour(s))  Blood culture (routine x 2)     Status: None (Preliminary result)   Collection Time: 01/11/16  8:00 AM  Result Value Ref Range Status   Specimen Description BLOOD RIGHT ANTECUBITAL  Final   Special Requests BOTTLES DRAWN AEROBIC AND ANAEROBIC 5CC  Final   Culture NO GROWTH 3 DAYS  Final   Report Status PENDING  Incomplete  Blood culture (routine x 2)     Status: None (Preliminary result)   Collection Time: 01/11/16  8:12 AM  Result Value Ref Range Status   Specimen Description BLOOD LEFT ANTECUBITAL  Final   Special Requests BOTTLES DRAWN  AEROBIC AND ANAEROBIC 5CC  Final   Culture NO  GROWTH 3 DAYS  Final   Report Status PENDING  Incomplete    Today   Subjective    [name] today has no headache,no chest abdominal pain,no new weakness tingling or numbness, feels much better wants to go home today.    Objective   Blood pressure 125/77, pulse 89, temperature 98.4 F (36.9 C), temperature source Oral, resp. rate 18, height 5\' 5"  (1.651 m), weight (!) 159.4 kg (351 lb 6.6 oz), SpO2 95 %.   Intake/Output Summary (Last 24 hours) at 01/15/16 0734 Last data filed at 01/14/16 0900  Gross per 24 hour  Intake              360 ml  Output                0 ml  Net              360 ml    Exam Awake Alert, Oriented x 3, No new F.N deficits, Normal affect [place].AT,PERRAL Supple Neck,No JVD, No cervical lymphadenopathy appriciated.  Symmetrical Chest wall movement, Good air movement bilaterally, CTAB RRR,No Gallops,Rubs or new Murmurs, No Parasternal Heave +ve B.Sounds, Abd Soft, Non tender, No organomegaly appriciated, No rebound -guarding or rigidity. No Cyanosis, Clubbing , No new Rash or bruise,  Bilateral lower extremity venous stasis left more than right, evidence of left leg cellulitis with chronic Lichenification     Data Review   CBC w Diff: Lab Results  Component Value Date   WBC 9.8 01/13/2016   HGB 12.2 01/13/2016   HCT 38.1 01/13/2016   PLT 241 01/13/2016   LYMPHOPCT 10 01/11/2016   MONOPCT 4 01/11/2016   EOSPCT 1 01/11/2016   BASOPCT 0 01/11/2016    CMP: Lab Results  Component Value Date   NA 136 01/13/2016   K 4.0 01/13/2016   CL 105 01/13/2016   CO2 25 01/13/2016   BUN 7 01/13/2016   CREATININE 0.90 01/13/2016   PROT 6.9 01/12/2016   ALBUMIN 2.6 (L) 01/12/2016   BILITOT 0.8 01/12/2016   ALKPHOS 90 01/12/2016   AST 16 01/12/2016   ALT 15 01/12/2016  .   Total Time in preparing paper work, data evaluation and todays exam - 35 minutes  Leroy Sea M.D on 01/15/2016 at 7:34  AM  [facility]   Office  [phone]

## 2016-01-15 NOTE — Discharge Instructions (Signed)
Follow with Primary MD  in 7 days, keep your left leg elevated  Get CBC, CMP, 2 view Chest X ray checked  by Primary MD or SNF MD in 5-7 days ( we routinely change or add medications that can affect your baseline labs and fluid status, therefore we recommend that you get the mentioned basic workup next visit with your PCP, your PCP may decide not to get them or add new tests based on their clinical decision)   Activity: As tolerated with Full fall precautions use walker/cane & assistance as needed   Disposition Home    Diet:   Heart Healthy low Carb and 1.5 lit/day fluid restriction.  Accuchecks 4 times/day, Once in AM empty stomach and then before each meal. Log in all results and show them to your Prim.MD in 3 days. If any glucose reading is under 80 or above 300 call your Prim MD immidiately. Follow Low glucose instructions for glucose under 80 as instructed.    On your next visit with your primary care physician please Get Medicines reviewed and adjusted.   Please request your Prim.MD to go over all Hospital Tests and Procedure/Radiological results at the follow up, please get all Hospital records sent to your Prim MD by signing hospital release before you go home.   If you experience worsening of your admission symptoms, develop shortness of breath, life threatening emergency, suicidal or homicidal thoughts you must seek medical attention immediately by calling 911 or calling your MD immediately  if symptoms less severe.  You Must read complete instructions/literature along with all the possible adverse reactions/side effects for all the Medicines you take and that have been prescribed to you. Take any new Medicines after you have completely understood and accpet all the possible adverse reactions/side effects.   Do not drive, operate heavy machinery, perform activities at heights, swimming or participation in water activities or provide baby sitting services if your were admitted  for syncope or siezures until you have seen by Primary MD or a Neurologist and advised to do so again.  Do not drive when taking Pain medications.    Do not take more than prescribed Pain, Sleep and Anxiety Medications  Special Instructions: If you have smoked or chewed Tobacco  in the last 2 yrs please stop smoking, stop any regular Alcohol  and or any Recreational drug use.  Wear Seat belts while driving.   Please note  You were cared for by a hospitalist during your hospital stay. If you have any questions about your discharge medications or the care you received while you were in the hospital after you are discharged, you can call the unit and asked to speak with the hospitalist on call if the hospitalist that took care of you is not available. Once you are discharged, your primary care physician will handle any further medical issues. Please note that NO REFILLS for any discharge medications will be authorized once you are discharged, as it is imperative that you return to your primary care physician (or establish a relationship with a primary care physician if you do not have one) for your aftercare needs so that they can reassess your need for medications and monitor your lab values.

## 2016-01-15 NOTE — Progress Notes (Signed)
Pt discharge education and instructions completed with pt at bedside; pt voices understanding and denies any questions. Pt IV and telemetry removed; pt discharge home with spouse to transport her home. Pt handed her prescriptions for Augmentin, Doxycycline and Eucerin. Pt transported of unit via her personal wheelchair with belongings to the side. Dionne Bucy RN

## 2016-01-16 LAB — CULTURE, BLOOD (ROUTINE X 2)
CULTURE: NO GROWTH
CULTURE: NO GROWTH

## 2016-01-25 ENCOUNTER — Ambulatory Visit (INDEPENDENT_AMBULATORY_CARE_PROVIDER_SITE_OTHER): Payer: Medicare PPO | Admitting: Family Medicine

## 2016-01-25 ENCOUNTER — Other Ambulatory Visit: Payer: Self-pay

## 2016-01-25 ENCOUNTER — Encounter: Payer: Self-pay | Admitting: Family Medicine

## 2016-01-25 VITALS — BP 122/72 | HR 64 | Temp 98.0°F

## 2016-01-25 DIAGNOSIS — IMO0002 Reserved for concepts with insufficient information to code with codable children: Secondary | ICD-10-CM | POA: Insufficient documentation

## 2016-01-25 DIAGNOSIS — Z993 Dependence on wheelchair: Secondary | ICD-10-CM | POA: Diagnosis not present

## 2016-01-25 DIAGNOSIS — I10 Essential (primary) hypertension: Secondary | ICD-10-CM | POA: Diagnosis not present

## 2016-01-25 DIAGNOSIS — E11649 Type 2 diabetes mellitus with hypoglycemia without coma: Secondary | ICD-10-CM | POA: Insufficient documentation

## 2016-01-25 DIAGNOSIS — Z794 Long term (current) use of insulin: Secondary | ICD-10-CM | POA: Insufficient documentation

## 2016-01-25 DIAGNOSIS — E1151 Type 2 diabetes mellitus with diabetic peripheral angiopathy without gangrene: Secondary | ICD-10-CM

## 2016-01-25 DIAGNOSIS — E1165 Type 2 diabetes mellitus with hyperglycemia: Secondary | ICD-10-CM

## 2016-01-25 DIAGNOSIS — L03116 Cellulitis of left lower limb: Secondary | ICD-10-CM | POA: Diagnosis not present

## 2016-01-25 LAB — CBC WITH DIFFERENTIAL/PLATELET
BASOS PCT: 0 %
Basophils Absolute: 0 cells/uL (ref 0–200)
EOS ABS: 385 {cells}/uL (ref 15–500)
Eosinophils Relative: 5 %
HEMATOCRIT: 39.8 % (ref 35.0–45.0)
HEMOGLOBIN: 13 g/dL (ref 11.7–15.5)
LYMPHS ABS: 1771 {cells}/uL (ref 850–3900)
Lymphocytes Relative: 23 %
MCH: 29.2 pg (ref 27.0–33.0)
MCHC: 32.7 g/dL (ref 32.0–36.0)
MCV: 89.4 fL (ref 80.0–100.0)
MONO ABS: 539 {cells}/uL (ref 200–950)
MONOS PCT: 7 %
MPV: 9.1 fL (ref 7.5–12.5)
NEUTROS ABS: 5005 {cells}/uL (ref 1500–7800)
Neutrophils Relative %: 65 %
PLATELETS: 378 10*3/uL (ref 140–400)
RBC: 4.45 MIL/uL (ref 3.80–5.10)
RDW: 16.1 % — ABNORMAL HIGH (ref 11.0–15.0)
WBC: 7.7 10*3/uL (ref 4.0–10.5)

## 2016-01-25 LAB — BASIC METABOLIC PANEL
BUN: 10 mg/dL (ref 7–25)
CALCIUM: 9.2 mg/dL (ref 8.6–10.4)
CO2: 23 mmol/L (ref 20–31)
CREATININE: 0.88 mg/dL (ref 0.50–0.99)
Chloride: 104 mmol/L (ref 98–110)
Glucose, Bld: 214 mg/dL — ABNORMAL HIGH (ref 65–99)
Potassium: 3.9 mmol/L (ref 3.5–5.3)
SODIUM: 139 mmol/L (ref 135–146)

## 2016-01-25 NOTE — Progress Notes (Signed)
Subjective:    Patient ID: [name], female    DOB: 16-Apr-1951, 65 y.o.   MRN: [id number]  HPI Chief Complaint  Patient presents with  . Other    new pt- hospital follow-up - leg issues   She is new to the practice and here to establish care and for a hospital follow up.  She moved here from [place], [place] with her husband approximately 1 month ago.  She states she has recurrent cellulitis to her lower extremities, mainly the LLE and spent 34 weeks at outpatient wound care in [facility]. She was discharged from [facility] hospital on 01/15/2016 and was hospitalized for cellulitis.   She states she has been wheel chair bound for years and has a motorized chair at home. She is able to transfer herself from her chair to the toilet and to bed. States she can also get into the car by herself. He husband and son helps care for her.   It appears that she is supposed to be getting home health care and PT from [facility] services. Patient states she spoke with them and they need an order from her PCP to initiate these services.    States she was diagnosed with DM2 in 2005, tried managing her diabetes with diet and exercise for a while and started on oral medication in 2009. Has been insulin dependent since 2010.   Blood sugars at home: 113 this morning. She is not checking her blood sugars on a regular basis.  State sometimes she "forgets" to take her medication. She takes meal time insulin and does not now what her blood sugars are.   Past Medical History:  Diagnosis Date  . Arthritis   . Asthma   . Diabetes mellitus without complication (HCC)   . Hypertension   . TIA (transient ischemic attack)    Past Surgical History:  Procedure Laterality Date  . CARPAL TUNNEL RELEASE    . CHOLECYSTECTOMY    . KNEE SURGERY        Review of Systems Pertinent positives and negatives in the history of present illness.     Objective:   Physical Exam  Constitutional: She is oriented to  person, place, and time. She appears well-nourished. No distress.  Morbidly obese, sitting in a wheelchair   HENT:  Mouth/Throat: Oropharynx is clear and moist and mucous membranes are normal.  Cardiovascular: Normal rate and regular rhythm.   Pulmonary/Chest: Effort normal and breath sounds normal.  Neurological: She is alert and oriented to person, place, and time.  Skin: Skin is warm, dry and intact.     Skin on bilateral lower extremities from just below the knees to ankles with darkened skin, scaling, mild warmth, without erythema, edema, drainage. LLE is significantly worse than right. A small ulceration is present to the right anterior shin without drainage, no surrounding erythema or induration.   Psychiatric: She has a normal mood and affect. Her speech is normal and behavior is normal.   BP 122/72   Pulse 64   Temp 98 F (36.7 C) (Oral)   Hemoglobin A1C 9.2% today.     Assessment & Plan:  Essential hypertension - Plan: Basic metabolic panel  Uncontrolled diabetes mellitus type 2 with peripheral artery disease (HCC) - Plan: CBC with Differential/Platelet, Basic metabolic panel  Cellulitis of left leg - Plan: CBC with Differential/Platelet, Basic metabolic panel  Morbid obesity, unspecified obesity type (HCC)  Wheelchair dependent  She is wheelchair dependent and unable to exercise.  Discussed that weight loss will be difficult and will consider referral to nutritionist in order to help her with calorie and carbohydrate counting.  Plan to refill her antibiotics for 2 additional weeks since her legs are not healing well enough.  Plan to get her set up with wound care.  HTN- BP is normal and no changes recommended.  Will place order with home health to set up PT and a RN per hospital discharge recommendations.  Labs ordered. Will check on chest XR and why this was requested on hospital discharge. Sample of Evaristo Buryresiba given along with diabetes testing supplies to help her get  through the weekend. She will check her blood sugars twice daily and bring these readings in on Monday, her next appointment.  Discussed that it is not safe for her to be on meal time insulin and not check her blood sugars. She cannot know how much insulin to administer. Will consider referral to endocrinology is unable to control blood sugars.   She has an appointment with Dr. Edilia Boickson with Vascular Surgery, cardiology on 03/05/2016.  Follow up Monday.

## 2016-01-27 ENCOUNTER — Encounter: Payer: Self-pay | Admitting: Family Medicine

## 2016-01-27 DIAGNOSIS — J45909 Unspecified asthma, uncomplicated: Secondary | ICD-10-CM | POA: Insufficient documentation

## 2016-01-27 DIAGNOSIS — M109 Gout, unspecified: Secondary | ICD-10-CM | POA: Insufficient documentation

## 2016-01-27 DIAGNOSIS — M1990 Unspecified osteoarthritis, unspecified site: Secondary | ICD-10-CM | POA: Insufficient documentation

## 2016-01-27 DIAGNOSIS — Z993 Dependence on wheelchair: Secondary | ICD-10-CM | POA: Insufficient documentation

## 2016-01-28 ENCOUNTER — Ambulatory Visit (INDEPENDENT_AMBULATORY_CARE_PROVIDER_SITE_OTHER): Payer: Medicare PPO | Admitting: Family Medicine

## 2016-01-28 ENCOUNTER — Telehealth: Payer: Self-pay | Admitting: Family Medicine

## 2016-01-28 ENCOUNTER — Encounter: Payer: Self-pay | Admitting: Family Medicine

## 2016-01-28 VITALS — BP 124/68 | HR 68

## 2016-01-28 DIAGNOSIS — Z9119 Patient's noncompliance with other medical treatment and regimen: Secondary | ICD-10-CM | POA: Diagnosis not present

## 2016-01-28 DIAGNOSIS — E1151 Type 2 diabetes mellitus with diabetic peripheral angiopathy without gangrene: Secondary | ICD-10-CM | POA: Diagnosis not present

## 2016-01-28 DIAGNOSIS — IMO0002 Reserved for concepts with insufficient information to code with codable children: Secondary | ICD-10-CM

## 2016-01-28 DIAGNOSIS — L03116 Cellulitis of left lower limb: Secondary | ICD-10-CM | POA: Diagnosis not present

## 2016-01-28 DIAGNOSIS — E1165 Type 2 diabetes mellitus with hyperglycemia: Secondary | ICD-10-CM

## 2016-01-28 DIAGNOSIS — I10 Essential (primary) hypertension: Secondary | ICD-10-CM

## 2016-01-28 DIAGNOSIS — Z91199 Patient's noncompliance with other medical treatment and regimen due to unspecified reason: Secondary | ICD-10-CM

## 2016-01-28 MED ORDER — AMOXICILLIN-POT CLAVULANATE 875-125 MG PO TABS: 1.0000 | ORAL_TABLET | Freq: Two times a day (BID) | ORAL | 0 refills | Status: DC

## 2016-01-28 MED ORDER — GLUCOSE BLOOD VI STRP: ORAL_STRIP | 3 refills | Status: DC

## 2016-01-28 MED ORDER — DOXYCYCLINE HYCLATE 100 MG PO TABS: 100.0000 mg | ORAL_TABLET | Freq: Two times a day (BID) | ORAL | 0 refills | Status: DC

## 2016-01-28 MED ORDER — ACCU-CHEK FASTCLIX LANCETS MISC: 3 refills | Status: DC

## 2016-01-28 NOTE — Telephone Encounter (Signed)
Called [facility] and Evaristo Buryresiba is [phone]$213 with insurance and Apidra is $197 with insurance.  States these do not require a prior auth, says pt is probably in donut hole.   [facility] [employer] [phone] and pt is in the donut hole and she has $2332 remaining until she is out of it.  Asked for cheaper alternatives & options are Levemir flex 100 units #15/30 $161.32 & Toujeo Solostar $134.14

## 2016-01-28 NOTE — Patient Instructions (Signed)
Check your blood sugar 3 times daily. Once fasting and the other 2 times is before you take your meal time insulin so that you know how much insulin to give yourself.  If blood sugar is less than 160 you should take 10 units of Apidra and if higher than 161 take 15 units of Apidra.   I would like for your fasting blood sugars to be less than 140.  If you have lows less than 80 or if your blood sugars are staying above 300 then call me.   Set your phone to remind yourself to take blood sugars and take medication.   We are going to get home health out to see you as soon as possible.   Continue taking antibiotics as discussed.   We are referring you to the wound care center.   Follow up in 1 month to see how you are doing with home health and your blood sugars.

## 2016-01-28 NOTE — Telephone Encounter (Signed)
Thanks and one more question...[name].[name].How about medication to replace the Apidra that is more affordable?

## 2016-01-28 NOTE — Telephone Encounter (Signed)
Pt Assistance forms were completed

## 2016-01-28 NOTE — Progress Notes (Signed)
Subjective:    Patient ID: [name], female    DOB: 10-18-1950, 65 y.o.   MRN: [id number]  HPI Chief Complaint  Patient presents with  . Follow-up    follow-up. blood sugar 161   She is here for follow up on blood sugars, recurrent cellulitis to LLE, hypertension.  States she only checked her fasting blood sugar today 161 and yesterday 173. She has not checked her blood sugars any other times even though she has been encouraged to check her sugars prior to taking mealtime insulin.. She states she "forgets to take it". Also states she forgets to take her medications sometimes and she forgot to take all of her medications yesterday including Tresiba. She states she does have supplies to check her blood sugar at home and she does have enough medication.  States in the past she has used an alarm on her phone to help her remember to take her medication. She has not been using an alarm recently for this. She states she has had an issue in the past with forgetting to take her medications and was given admitting mental status exam that she states was fine.   Other medications she takes includes  clonazapam for anxiety- states she is taking as needed. States she does not need this often. Maybe 4 days per week.  Daily cyclopenzaprine for muscle spasms- states she has been taking this for several months.  Hydrocodone for chronic low back pain. Has been taking this for at least 2 years. Has not taken this since [place] [facility] [place] [facility].   Reviewed allergies, medications, past medical,  and social history.   Review of Systems Pertinent positives and negatives in the history of present illness.     Objective:   Physical Exam BP 124/68   Pulse 68  Alert and in no distress. Cardiac exam shows a regular sinus rhythm without murmurs or gallops. Lungs are clear to auscultation. LLE with lichenification from just below the knee to top of foot, no erythema or drainage, normal capillary  refill, pulses palpable, ROM and strength normal. RLE with small area of ulceration to anterior tibia, no erythema, edema, drainage, otherwise a normal RLE exam.       Assessment & Plan:  Uncontrolled diabetes mellitus type 2 with peripheral artery disease (HCC) - Plan: glucose blood (ACCU-CHEK AVIVA) test strip, ACCU-CHEK FASTCLIX LANCETS MISC  Cellulitis of left leg - Plan: AMB referral to wound care center, amoxicillin-clavulanate (AUGMENTIN) 875-125 MG tablet, doxycycline (VIBRA-TABS) 100 MG tablet  Morbid obesity, unspecified obesity type [facility](HCC)  Essential hypertension  Personal history of noncompliance with medical treatment, presenting hazards to health  Reviewed lab results from Friday with patient. Her white count is within normal range. Reviewed all records from hospitalization including labs. A follow-up chest x-ray was recommended however there is no indication for this and there was no CXR done while she was hospitalized per chart.  Refilled antibiotics and recommend that she continue on these for the next 10 days.   Recommend that she set an alarm to take medications since she is forgetting to do this.  Strongly encouraged her to start taking care of herself including checking her blood sugars daily and taking her medications. Discussed potential consequences of uncontrolled blood sugars, hypertension. Blood pressure today is within goal range.  She has an appointment with the wound care center for this Thursday.  Martie LeeSabrina, CMA, emailed [facility] health, [facility], referral was made while she was in the hospital. Will  follow through to get this set up.  She will go at scheduled to appointment with vascular surgeon on 03/05/2016.  Discussed that in the future if she requires chronic pain medication that we will have to discuss referral to a pain management specialist. Vernona RiegerLaura, front office, is attempting to help patient with assistance for medications. She will sign a medical release  form and we will attempt to get records from her previous PCP in [place]. Follow up in 1 month.

## 2016-01-28 NOTE — Telephone Encounter (Signed)
Called pt & looks like she should qualify for Pt Assistance due to her income.  I printed out applications and pt will come by & complete.  So if we can just give her samples to hold her until we hear back from Pt Assistace and hopefully we can just leave her on current meds.

## 2016-01-29 ENCOUNTER — Telehealth: Payer: Self-pay | Admitting: Internal Medicine

## 2016-01-29 NOTE — Telephone Encounter (Signed)
Amanda-RN with [facility] went out for eval with patient. She is going to be going out there twice a week for 2 weeks and then 1 time a week for 1 week for diabetes and med management.. She also states that patient had pills of hydrazine and amtripyline that patient was going to bring by our office sometime and ask if she still needs to be on those meds as she did not see them on the med list that she had. If you had any questions for amanda you can call her at [phone]

## 2016-01-29 NOTE — Telephone Encounter (Signed)
Faxed forms

## 2016-01-30 NOTE — Telephone Encounter (Signed)
Pt was notified that she takes hydroxyzine as needed for itching. Pt was advised take as needed but if needs refills then would need appt.

## 2016-01-30 NOTE — Telephone Encounter (Signed)
Those medications are not on her discharge summary so I do not recommend that she takes those.

## 2016-01-31 ENCOUNTER — Telehealth: Payer: Self-pay

## 2016-01-31 ENCOUNTER — Encounter (HOSPITAL_BASED_OUTPATIENT_CLINIC_OR_DEPARTMENT_OTHER): Payer: Medicare PPO | Attending: Internal Medicine

## 2016-01-31 MED ORDER — GABAPENTIN 300 MG PO CAPS: 300.0000 mg | ORAL_CAPSULE | Freq: Two times a day (BID) | ORAL | 2 refills | Status: DC

## 2016-01-31 NOTE — Telephone Encounter (Signed)
Needs refill of Gabapentin 300mg  bid per hospital d/c orders [facility] on [address].  Please call nurse [phone], Lyla Sonarrie nurse with [facility], when called to pharmacy as pt has hx of noncompliance per nurse. Trixie Rude/RLB

## 2016-01-31 NOTE — Telephone Encounter (Signed)
Sent med into pharmacy and tried to call Lyla SonCarrie and left message (just wanted to tell her med was sent to pharmacy for pt)

## 2016-01-31 NOTE — Telephone Encounter (Signed)
Please take care of this.  

## 2016-01-31 NOTE — Telephone Encounter (Signed)
Records from Dr. Casey BurkittLynn Goetze placed in your folder for review. Trixie Rude/RLB

## 2016-02-01 NOTE — Telephone Encounter (Signed)
Due to submitted application was outdated [employer] wouldn't accept so I completed a new [employer] Pt Assistance for [employer] &  dx codes and copy of [employer].  Pt informed need new signature and Apidra samples in refrig.  Application ready & waiting for pt's signature

## 2016-02-04 ENCOUNTER — Telehealth: Payer: Self-pay

## 2016-02-04 MED ORDER — INSULIN GLULISINE 100 UNIT/ML IJ SOLN: 10.0000 [IU] | Freq: Two times a day (BID) | INTRAMUSCULAR | 0 refills | Status: DC

## 2016-02-04 NOTE — Telephone Encounter (Signed)
Pt came to office to -pick up samples.

## 2016-02-05 ENCOUNTER — Telehealth: Payer: Self-pay

## 2016-02-05 NOTE — Telephone Encounter (Signed)
Please call back to ok 2 more weeks with physical therapy but need out need to give them Dr Susann GivensLalonde approval for this. Thanks.

## 2016-02-05 NOTE — Telephone Encounter (Signed)
Darcella Cheshireobert Larson, PT did eval with [facility]- he request treatment for 2x week for 4 weeks. Please call back with verbal if this is ok. Molly MaduroRobert said it was ok to leave VCM with your name and credentials. [phone]

## 2016-02-05 NOTE — Telephone Encounter (Signed)
Called and left detailed message for robert about pt with PT

## 2016-02-08 ENCOUNTER — Telehealth: Payer: Self-pay | Admitting: Internal Medicine

## 2016-02-08 NOTE — Telephone Encounter (Signed)
Rob Larson-PT from [facility] called wanting the ok to do PT for 3x week for 1 week and 2x a week for 3 weeks. Per vickie okay to give the ok

## 2016-02-25 ENCOUNTER — Encounter: Payer: Self-pay | Admitting: Family Medicine

## 2016-02-25 ENCOUNTER — Ambulatory Visit (INDEPENDENT_AMBULATORY_CARE_PROVIDER_SITE_OTHER): Payer: Medicare PPO | Admitting: Family Medicine

## 2016-02-25 ENCOUNTER — Telehealth: Payer: Self-pay | Admitting: Family Medicine

## 2016-02-25 VITALS — BP 132/78 | HR 100

## 2016-02-25 DIAGNOSIS — E1151 Type 2 diabetes mellitus with diabetic peripheral angiopathy without gangrene: Secondary | ICD-10-CM | POA: Diagnosis not present

## 2016-02-25 DIAGNOSIS — F419 Anxiety disorder, unspecified: Secondary | ICD-10-CM | POA: Diagnosis not present

## 2016-02-25 DIAGNOSIS — G8929 Other chronic pain: Secondary | ICD-10-CM | POA: Diagnosis not present

## 2016-02-25 DIAGNOSIS — I10 Essential (primary) hypertension: Secondary | ICD-10-CM | POA: Diagnosis not present

## 2016-02-25 DIAGNOSIS — M25561 Pain in right knee: Secondary | ICD-10-CM | POA: Diagnosis not present

## 2016-02-25 DIAGNOSIS — E1165 Type 2 diabetes mellitus with hyperglycemia: Secondary | ICD-10-CM | POA: Diagnosis not present

## 2016-02-25 DIAGNOSIS — J4520 Mild intermittent asthma, uncomplicated: Secondary | ICD-10-CM

## 2016-02-25 DIAGNOSIS — IMO0002 Reserved for concepts with insufficient information to code with codable children: Secondary | ICD-10-CM

## 2016-02-25 DIAGNOSIS — Z23 Encounter for immunization: Secondary | ICD-10-CM

## 2016-02-25 DIAGNOSIS — Z993 Dependence on wheelchair: Secondary | ICD-10-CM | POA: Diagnosis not present

## 2016-02-25 MED ORDER — CLONAZEPAM 0.5 MG PO TABS: 0.5000 mg | ORAL_TABLET | Freq: Every day | ORAL | 0 refills | Status: DC | PRN

## 2016-02-25 MED ORDER — DICLOFENAC SODIUM 1 % TD GEL: 4.0000 g | Freq: Four times a day (QID) | TRANSDERMAL | 1 refills | Status: DC

## 2016-02-25 NOTE — Patient Instructions (Signed)
For the right knee pain try taking Tylenol 325 mg twice daily for the next 2 weeks and see if this helps. You can try the voltaren gel to the knee as well as prescribed.

## 2016-02-25 NOTE — Telephone Encounter (Signed)
Called in Cl[facility]azepam .5mg  to [facility]

## 2016-02-25 NOTE — Progress Notes (Signed)
Subjective:    Patient ID: Detta Mellin, female    DOB: 10-15-1950, 65 y.o.   MRN: [id number]  HPI Chief Complaint  Patient presents with  . Diabetes    follow up   also wants flu shot   She is here for follow up on diabetes, HTN, and asthma.  States she has been checking blood sugar at home twice daily most days, did not bring in her readings or meter. Reports readings in the 200 and 300 range.  States she ran out of tresiba last week but she did not call to let us know this information. States she has been taking Apidra only.  Her son is with her today.   She states she went to the wound care center for 1 visit and that she was told that she did not need to be there. States she no longer has an open wound to her LLE.   Complains of chronic low back and right knee pain. States she is not currently taking anything for this. Would like something topical for her knee.   Denies fever, chills, cough, chest pain, palpitations, shortness of breath, calf tenderness or pain, abdominal pain, nausea, vomiting, diarrhea.  Has not used albuterol inhaler since our last visit.   Requests refill of clonazepam. States she has been taking this for years. Last prescribed by her PCP in [facility] for anxiety. States she takes this as needed and not daily.    Past Medical History:  Diagnosis Date  . Arthritis   . Asthma   . Diabetes mellitus without complication (HCC)   . Hypertension   . TIA (transient ischemic attack)     Reviewed allergies, medications, past medical, and social history.   Review of Systems Pertinent positives and negatives in the history of present illness.     Objective:   Physical Exam BP 132/78   Pulse 100   Alert and in no distress. Cardiac exam shows a regular sinus rhythm without murmurs or gallops. Lungs are clear to auscultation.      Assessment & Plan:  Uncontrolled diabetes mellitus type 2 with peripheral artery disease (HCC) - Plan: TSH, Ambulatory referral  to Endocrinology  Essential hypertension  Morbid obesity, unspecified obesity type (HCC) - Plan: TSH  Need for immunization against influenza - Plan: Flu vaccine HIGH DOSE PF (Fluzone High dose)  Chronic knee pain, right - Plan: diclofenac sodium (VOLTAREN) 1 % GEL  Asthma, mild intermittent, uncomplicated  Wheelchair dependent  Anxiety disorder, unspecified anxiety disorder type - Plan: clonazePAM (KLONOPIN) 0.5 MG tablet   Discussed that her diabetes remains out of control which increases her risk of developing heart disease and other chronic health conditions. She was under the care of endocrinology in [facility] and would like to see a specialist here. Referral made. I do question her medication compliance and she is not currently checking blood sugars as recommended. Will appreciate help managing her diabetes.  She would like a referral to pain management for chronic aches and pains, her specific complaint today is right knee pain without acute injury. Plan to have her try tylenol and voltaren gel and then will follow up on symptoms.  She is not needing her albuterol inhaler and asthma appears to be uncomplicated at this time.  Blood pressure is within goal. Continue on current medication and watch salt intake.  Refilled clonazepam for occasional anxiety. I have not prescribed this for her in the past. Has been been taking this prn for years. States  it helps and she does not need it often. Will monitor use of this.  Flu shot given.  Follow up in 3 months or sooner if pain is not under control.

## 2016-02-26 ENCOUNTER — Encounter: Payer: Self-pay | Admitting: Family Medicine

## 2016-02-26 LAB — TSH: TSH: 1.97 m[IU]/L

## 2016-02-29 ENCOUNTER — Encounter: Payer: Self-pay | Admitting: Internal Medicine

## 2016-03-05 ENCOUNTER — Encounter: Payer: Medicare PPO | Admitting: Vascular Surgery

## 2016-03-06 ENCOUNTER — Encounter (HOSPITAL_COMMUNITY): Payer: Self-pay | Admitting: *Deleted

## 2016-03-06 ENCOUNTER — Ambulatory Visit (INDEPENDENT_AMBULATORY_CARE_PROVIDER_SITE_OTHER): Payer: Medicare PPO | Admitting: Family Medicine

## 2016-03-06 ENCOUNTER — Encounter: Payer: Self-pay | Admitting: Family Medicine

## 2016-03-06 ENCOUNTER — Emergency Department (HOSPITAL_COMMUNITY)
Admission: EM | Admit: 2016-03-06 | Discharge: 2016-03-07 | Disposition: A | Discharge status: Home / Self Care | Payer: Medicare PPO | Attending: Emergency Medicine | Admitting: Emergency Medicine

## 2016-03-06 VITALS — BP 120/70 | HR 91 | Temp 98.1°F

## 2016-03-06 DIAGNOSIS — R6884 Jaw pain: Secondary | ICD-10-CM

## 2016-03-06 DIAGNOSIS — Z8673 Personal history of transient ischemic attack (TIA), and cerebral infarction without residual deficits: Secondary | ICD-10-CM | POA: Diagnosis not present

## 2016-03-06 DIAGNOSIS — R51 Headache: Secondary | ICD-10-CM | POA: Diagnosis not present

## 2016-03-06 DIAGNOSIS — Z7982 Long term (current) use of aspirin: Secondary | ICD-10-CM | POA: Diagnosis not present

## 2016-03-06 DIAGNOSIS — IMO0002 Reserved for concepts with insufficient information to code with codable children: Secondary | ICD-10-CM

## 2016-03-06 DIAGNOSIS — R479 Unspecified speech disturbances: Secondary | ICD-10-CM

## 2016-03-06 DIAGNOSIS — E1165 Type 2 diabetes mellitus with hyperglycemia: Secondary | ICD-10-CM | POA: Diagnosis not present

## 2016-03-06 DIAGNOSIS — Z794 Long term (current) use of insulin: Secondary | ICD-10-CM | POA: Diagnosis not present

## 2016-03-06 DIAGNOSIS — I10 Essential (primary) hypertension: Secondary | ICD-10-CM | POA: Insufficient documentation

## 2016-03-06 DIAGNOSIS — E1151 Type 2 diabetes mellitus with diabetic peripheral angiopathy without gangrene: Secondary | ICD-10-CM | POA: Diagnosis not present

## 2016-03-06 DIAGNOSIS — Z79899 Other long term (current) drug therapy: Secondary | ICD-10-CM | POA: Diagnosis not present

## 2016-03-06 DIAGNOSIS — R519 Headache, unspecified: Secondary | ICD-10-CM

## 2016-03-06 DIAGNOSIS — J45909 Unspecified asthma, uncomplicated: Secondary | ICD-10-CM | POA: Insufficient documentation

## 2016-03-06 DIAGNOSIS — E119 Type 2 diabetes mellitus without complications: Secondary | ICD-10-CM | POA: Diagnosis not present

## 2016-03-06 DIAGNOSIS — M316 Other giant cell arteritis: Secondary | ICD-10-CM

## 2016-03-06 LAB — GLUCOSE, POCT (MANUAL RESULT ENTRY): POC Glucose: 199 mg/dl — AB (ref 70–99)

## 2016-03-06 NOTE — ED Triage Notes (Signed)
Pt reports having right side jaw pain and tongue/oral pain for several days. Pain is in mandible and jaw line. Reports pain to her tongue. Pain increases when talking and eating.

## 2016-03-06 NOTE — ED Notes (Signed)
Pt presents with bilateral jaw pain starting last night.  Pt states she saw her primary care doctor that ruled out a stroke and her dentist that took an xray and said her teeth were fine.

## 2016-03-06 NOTE — Progress Notes (Signed)
Subjective:    Patient ID: [name], female    DOB: Mar 11, 1951, 65 y.o.   MRN: [id number]  HPI Chief Complaint  Patient presents with  . walk in    tooth pain- slurred speech since last night   She is here with complaints of right lower back tooth pain and right sided jaw pain and tenderness. Pain is dull and started last night. States pain is worse with opening and closing and states she is having a hard time opening her mouth enough to eat or take her medication. States her speech is slurred because she cannot open her mouth. Denies tingling, numbness or weakness. Denies difficulty swallowing. No memory issues or confusion.   Denies fever, chills, headache, vision changes, ear pain, rhinorrhea, sore throat, chest pain, palpitations, shortness of breath, cough, abdominal pain, nausea, vomiting, diarrhea.   States she was not able to eat, cannot chew on right side, drank ensure. States she could not take her medication.  Did not check her blood sugar.    Past Medical History:  Diagnosis Date  . Arthritis   . Asthma   . Colonic polyp    last colonoscopy done in 2009 with normal results per medical record  . Depressive disorder   . Diabetes mellitus without complication (HCC)   . Gastric polyp   . Gout    has taken allopurinol 300mg  once daily in past  . Hypertension   . Mixed hyperlipidemia    01/2016 Total chol 141, HDL 59, LDL 63, ration 1.1  . Osteoarthritis   . Sleep apnea   . TIA (transient ischemic attack)    Current Outpatient Prescriptions on File Prior to Visit  Medication Sig Dispense Refill  . albuterol (PROVENTIL HFA;VENTOLIN HFA) 108 (90 Base) MCG/ACT inhaler Inhale 2 puffs into the lungs every 6 (six) hours as needed for wheezing or shortness of breath.    . allopurinol (ZYLOPRIM) 300 MG tablet Take 300 mg by mouth daily.    Marland Kitchen aspirin 325 MG tablet Take 325 mg by mouth daily.    Marland Kitchen atenolol (TENORMIN) 25 MG tablet Take 25-50 mg by mouth daily. Take 50 mg  every morning  Take 25 mg every evening    . clonazePAM (KLONOPIN) 0.5 MG tablet Take 1 tablet (0.5 mg total) by mouth daily as needed for anxiety. 30 tablet 0  . cyclobenzaprine (FLEXERIL) 10 MG tablet Take 10 mg by mouth 3 (three) times daily as needed for muscle spasms.    . diclofenac sodium (VOLTAREN) 1 % GEL Apply 4 g topically 4 (four) times daily. 1 Tube 1  . gabapentin (NEURONTIN) 300 MG capsule Take 1 capsule (300 mg total) by mouth 2 (two) times daily. 60 capsule 2  . glucose blood (ACCU-CHEK AVIVA) test strip Test 3 times a day. Pt uses accu-chek aviva meter 100 each 3  . hydrocerin (EUCERIN) CREA Apply 1 application topically daily. 228 g 0  . hydrOXYzine (VISTARIL) 50 MG capsule Take 50 mg by mouth 3 (three) times daily as needed.    . Insulin Degludec (TRESIBA FLEXTOUCH) 100 UNIT/ML SOPN Inject 75 Units into the skin daily.    . insulin glulisine (APIDRA) 100 UNIT/ML injection Inject 0.1-0.15 mLs (10-15 Units total) into the skin 2 (two) times daily. <160 take 10 units  >161 take 15 units 2 vial 0  . magnesium gluconate (MAGONATE) 500 MG tablet Take 500 mg by mouth 2 (two) times daily.    Marland Kitchen ACCU-CHEK FASTCLIX LANCETS MISC Test 3  times a day. Patient uses accu-check meter 102 each 3  . NIFEdipine (PROCARDIA XL/ADALAT-CC) 90 MG 24 hr tablet Take 90 mg by mouth daily.     No current facility-administered medications on file prior to visit.     Review of Systems Pertinent positives and negatives in the history of present illness.     Objective:   Physical Exam  Constitutional: She is oriented to person, place, and time. She appears well-developed and well-nourished. No distress.  HENT:  Head:    Nose: Nose normal.  Mouth/Throat: Uvula is midline and oropharynx is clear and moist. Mucous membranes are dry. No oral lesions.  Tenderness and mild edema to right TMJ. Pain reproducible with movement. No erythema, rash or bruising. Mild edema to right cheek and jaw.   Eyes:  Conjunctivae and EOM are normal. Pupils are equal, round, and reactive to light.  Neck: Normal range of motion. Neck supple.  Cardiovascular: Normal rate, regular rhythm and normal heart sounds.   Pulmonary/Chest: Effort normal.  Lymphadenopathy:    She has no cervical adenopathy.  Neurological: She is alert and oriented to person, place, and time. She has normal strength. No cranial nerve deficit or sensory deficit.  No facial asymmetry,  normal tongue protrusion, sensation and motor intact to face, no pronator drift,equal grip strength, normal bilateral upper and lower extremity strength.  She is wheelchair dependent.   Skin: Skin is warm and dry. No rash noted. No cyanosis. No pallor.  Psychiatric: She has a normal mood and affect. Her behavior is normal. Judgment and thought content normal. Her speech is slurred. Cognition and memory are normal.  Speech slightly slurred and patient unable to open mouth due to pain.    BP 120/70   Pulse 91   Temp 98.1 F (36.7 C) (Oral)   Random glucose finger stick: 199     Assessment & Plan:  Jaw pain  Uncontrolled diabetes mellitus type 2 with peripheral artery disease (HCC) - Plan: POCT glucose (manual entry)  Speech abnormality  Discussed that her neurological exam is normal and speech appears to be affected by jaw pain and limited ability to open her mouth.  Glucose is 199.  Suspect that her jaw pain is related to TMJ and she may have a bad tooth however it is difficult for me to assess.  Dr. Copper, dentist, has an opening and will see her immediately. Sabrina, CMA, will wheel her in her wheelchair to his office next door.  Request that they contact me after their exam. Will follow up pending dental exam.  If symptoms are worsening or if dental exam is normal will send her for CT scan or possibly to the ED. She does have a history of TIAs last year.

## 2016-03-07 ENCOUNTER — Telehealth: Payer: Self-pay | Admitting: Internal Medicine

## 2016-03-07 ENCOUNTER — Emergency Department (HOSPITAL_COMMUNITY): Payer: Medicare PPO

## 2016-03-07 DIAGNOSIS — M316 Other giant cell arteritis: Secondary | ICD-10-CM

## 2016-03-07 LAB — COMPREHENSIVE METABOLIC PANEL
ALT: 14 U/L (ref 14–54)
AST: 16 U/L (ref 15–41)
Albumin: 3.3 g/dL — ABNORMAL LOW (ref 3.5–5.0)
Alkaline Phosphatase: 116 U/L (ref 38–126)
Anion gap: 9 (ref 5–15)
BUN: 6 mg/dL (ref 6–20)
CHLORIDE: 101 mmol/L (ref 101–111)
CO2: 26 mmol/L (ref 22–32)
CREATININE: 0.8 mg/dL (ref 0.44–1.00)
Calcium: 9.4 mg/dL (ref 8.9–10.3)
GFR calc non Af Amer: >60 mL/min (ref >60)
Glucose, Bld: 183 mg/dL — ABNORMAL HIGH (ref 65–99)
POTASSIUM: 3.7 mmol/L (ref 3.5–5.1)
SODIUM: 136 mmol/L (ref 135–145)
Total Bilirubin: 0.4 mg/dL (ref 0.3–1.2)
Total Protein: 8.3 g/dL — ABNORMAL HIGH (ref 6.5–8.1)

## 2016-03-07 LAB — CBC WITH DIFFERENTIAL/PLATELET
BASOS ABS: 0 10*3/uL (ref 0.0–0.1)
Basophils Relative: 0 %
EOS ABS: 0.2 10*3/uL (ref 0.0–0.7)
EOS PCT: 3 %
HCT: 43 % (ref 36.0–46.0)
Hemoglobin: 13.9 g/dL (ref 12.0–15.0)
Lymphocytes Relative: 22 %
Lymphs Abs: 1.5 10*3/uL (ref 0.7–4.0)
MCH: 29.5 pg (ref 26.0–34.0)
MCHC: 32.3 g/dL (ref 30.0–36.0)
MCV: 91.3 fL (ref 78.0–100.0)
Monocytes Absolute: 0.6 10*3/uL (ref 0.1–1.0)
Monocytes Relative: 8 %
Neutro Abs: 4.7 10*3/uL (ref 1.7–7.7)
Neutrophils Relative %: 67 %
PLATELETS: 297 10*3/uL (ref 150–400)
RBC: 4.71 MIL/uL (ref 3.87–5.11)
RDW: 15.3 % (ref 11.5–15.5)
WBC: 7 10*3/uL (ref 4.0–10.5)

## 2016-03-07 LAB — SEDIMENTATION RATE: SED RATE: 61 mm/h — AB (ref 0–22)

## 2016-03-07 LAB — C-REACTIVE PROTEIN: CRP: 2.8 mg/dL — ABNORMAL HIGH (ref <1.0)

## 2016-03-07 MED ORDER — ASPIRIN 81 MG PO CHEW
81.0000 mg | CHEWABLE_TABLET | Freq: Once | ORAL | Status: AC
  Administered 2016-03-07: 81 mg via ORAL
  Filled 2016-03-07: qty 1

## 2016-03-07 MED ORDER — MORPHINE SULFATE (PF) 4 MG/ML IV SOLN
4.0000 mg | Freq: Once | INTRAVENOUS | Status: AC
  Administered 2016-03-07: 4 mg via INTRAVENOUS
  Filled 2016-03-07: qty 1

## 2016-03-07 MED ORDER — PREDNISONE 20 MG PO TABS: 60.0000 mg | ORAL_TABLET | Freq: Every day | ORAL | 0 refills | Status: DC

## 2016-03-07 MED ORDER — PREDNISONE 20 MG PO TABS: 60.0000 mg | ORAL_TABLET | Freq: Every day | ORAL | 0 refills | Status: AC

## 2016-03-07 MED ORDER — IOPAMIDOL (ISOVUE-300) INJECTION 61%
INTRAVENOUS | Status: AC
  Administered 2016-03-07: 75 mL
  Filled 2016-03-07: qty 75

## 2016-03-07 MED ORDER — IOPAMIDOL (ISOVUE-300) INJECTION 61%
INTRAVENOUS | Status: AC
  Filled 2016-03-07: qty 75

## 2016-03-07 MED ORDER — ASPIRIN 81 MG PO CHEW: 81.0000 mg | CHEWABLE_TABLET | Freq: Once | ORAL | Status: DC

## 2016-03-07 MED ORDER — METHYLPREDNISOLONE SODIUM SUCC 125 MG IJ SOLR
125.0000 mg | Freq: Once | INTRAMUSCULAR | Status: AC
  Administered 2016-03-07: 125 mg via INTRAVENOUS
  Filled 2016-03-07: qty 2

## 2016-03-07 MED ORDER — PREDNISONE 20 MG PO TABS: 60.0000 mg | ORAL_TABLET | Freq: Once | ORAL | Status: DC

## 2016-03-07 NOTE — ED Provider Notes (Signed)
[facility] DEPT Provider Note   CSN: [id number] Arrival date & time: 03/06/16  1558     History   Chief Complaint Chief Complaint  Patient presents with  . Jaw Pain    HPI [name] is a 65 y.o. female.  Patient is 65 yo F with PMH of diabetes, HTN, and TIA, presenting to ED with chief complaint of right sided facial/jaw pain that has been worsening since onset of last evening. Patient describes the pain as throbbing, and worse with eating and talking. Also reports right ear pain, but denies recent fever, chills, headache, dizziness, weakness, visual changes, neck pain, chest pain, or shortness of breath. Was seen by PCP earlier today, who suspected TMJ, and referred to dentist for X-rays and dental exam. X-ray was negative and normal dental exam. No hx of facial trauma, periodontal disease, or dental abscesses.      Past Medical History:  Diagnosis Date  . Arthritis   . Asthma   . Colonic polyp    last colonoscopy done in 2009 with normal results per medical record  . Depressive disorder   . Diabetes mellitus without complication ([place])   . Gastric polyp   . Gout    has taken allopurinol 35m once daily in past  . Hypertension   . Mixed hyperlipidemia    01/2016 Total chol 141, HDL 59, LDL 63, ration 1.1  . Osteoarthritis   . Sleep apnea   . TIA (transient ischemic attack)     Patient Active Problem List   Diagnosis Date Noted  . Morbid obesity ([place] 01/27/2016  . Wheelchair dependent 01/27/2016  . Gout 01/27/2016  . Asthma 01/27/2016  . Arthritis 01/27/2016  . Uncontrolled diabetes mellitus type 2 with peripheral artery disease ([place] 01/25/2016  . Cellulitis of left leg 01/11/2016  . DM (diabetes mellitus) ([facility] 01/11/2016  . Hypertension 01/11/2016    Past Surgical History:  Procedure Laterality Date  . CARPAL TUNNEL RELEASE    . CHOLECYSTECTOMY    . [facility]  . PARTIAL HYSTERECTOMY  1982   uterus per record     OB History    No data available       Home Medications    Prior to Admission medications   Medication Sig Start Date End Date Taking? Authorizing Provider  albuterol (PROVENTIL HFA;VENTOLIN HFA) 108 (90 Base) MCG/ACT inhaler Inhale 2 puffs into the lungs every 6 (six) hours as needed for wheezing or shortness of breath.   Yes Historical Provider, MD  allopurinol (ZYLOPRIM) 300 MG tablet Take 300 mg by mouth daily.   Yes Historical Provider, MD  aspirin 325 MG tablet Take 325 mg by mouth daily.   Yes Historical Provider, MD  atenolol (TENORMIN) 25 MG tablet Take 25-50 mg by mouth daily. Take 50 mg every morning  Take 25 mg every evening   Yes Historical Provider, MD  clonazePAM (KLONOPIN) 0.5 MG tablet Take 1 tablet (0.5 mg total) by mouth daily as needed for anxiety. 02/25/16  Yes VGirtha Rm NP  cyclobenzaprine (FLEXERIL) 10 MG tablet Take 10 mg by mouth 3 (three) times daily as needed for muscle spasms.   Yes Historical Provider, MD  diclofenac sodium (VOLTAREN) 1 % GEL Apply 4 g topically 4 (four) times daily. 02/25/16  Yes VGirtha Rm NP  gabapentin (NEURONTIN) 300 MG capsule Take 1 capsule (300 mg total) by mouth 2 (two) times daily. 01/31/16  Yes VGirtha Rm NP  hydrocerin (EUCERIN) CREA Apply 1 application topically daily. 01/15/16  Yes Thurnell Lose, MD  hydrOXYzine (VISTARIL) 50 MG capsule Take 50 mg by mouth 3 (three) times daily as needed.   Yes Historical Provider, MD  Insulin Degludec (TRESIBA FLEXTOUCH) 100 UNIT/ML SOPN Inject 75 Units into the skin daily.   Yes Historical Provider, MD  insulin glulisine (APIDRA) 100 UNIT/ML injection Inject 0.1-0.15 mLs (10-15 Units total) into the skin 2 (two) times daily. <160 take 10 units  >161 take 15 units 02/04/16  Yes Girtha Rm, NP  magnesium gluconate (MAGONATE) 500 MG tablet Take 500 mg by mouth 2 (two) times daily.   Yes Historical Provider, MD  NIFEdipine (PROCARDIA XL/ADALAT-CC) 90 MG 24 hr tablet Take 90  mg by mouth daily.   Yes Historical Provider, MD  ACCU-CHEK FASTCLIX LANCETS MISC Test 3 times a day. Patient uses accu-check meter 01/28/16   Girtha Rm, NP  glucose blood (ACCU-CHEK AVIVA) test strip Test 3 times a day. Pt uses accu-chek aviva meter 01/28/16   Girtha Rm, NP    Family History History reviewed. No pertinent family history.  Social History Social History  Substance Use Topics  . Smoking status: Never Smoker  . Smokeless tobacco: Never Used  . Alcohol use No     Allergies   Ace inhibitors and Metformin and related   Review of Systems Review of Systems  Constitutional: Negative for chills and fever.  HENT: Positive for ear pain. Negative for dental problem, hearing loss, mouth sores, sore throat, trouble swallowing and voice change.   Eyes: Negative for pain and visual disturbance.  Respiratory: Negative for cough and shortness of breath.   Cardiovascular: Negative for chest pain and palpitations.  Gastrointestinal: Negative for abdominal pain, blood in stool and vomiting.  Genitourinary: Negative for dysuria and hematuria.  Musculoskeletal: Negative for back pain, neck pain and neck stiffness.  Skin: Negative for color change and rash.  Neurological: Negative for dizziness, seizures, syncope and headaches.  All other systems reviewed and are negative.    Physical Exam Updated Vital Signs BP 121/81   Pulse 78   Temp 97.2 F (36.2 C) (Oral)   Resp 16   SpO2 97%   Physical Exam  Constitutional: She is oriented to person, place, and time.  Obese female, lying in stretcher, in obvious discomfort but no distress.  HENT:  Head: Normocephalic and atraumatic.  Right Ear: Hearing and tympanic membrane normal.  Left Ear: Hearing and tympanic membrane normal.  Mouth/Throat: Uvula is midline, oropharynx is clear and moist and mucous membranes are normal. No oral lesions. No trismus in the jaw. Normal dentition. No dental abscesses or uvula swelling. No  oropharyngeal exudate, posterior oropharyngeal edema or tonsillar abscesses.  Palpation of right side of face/temporal region is exquisitely tender.  Eyes: Conjunctivae are normal.  Neck: Neck supple.  Cardiovascular: Normal rate, regular rhythm and normal heart sounds.   Pulmonary/Chest: Effort normal and breath sounds normal. No respiratory distress.  Abdominal: Soft. There is no tenderness.  Musculoskeletal: She exhibits no edema.  Neurological: She is alert and oriented to person, place, and time. No cranial nerve deficit. Coordination normal.  Skin: Skin is warm and dry. No rash noted.  Psychiatric: She has a normal mood and affect.  Nursing note and vitals reviewed.    ED Treatments / Results  Labs (all labs ordered are listed, but only abnormal results are displayed) Labs Reviewed  COMPREHENSIVE METABOLIC PANEL - Abnormal; Notable for the  following:       Result Value   Glucose, Bld 183 (*)    Total Protein 8.3 (*)    Albumin 3.3 (*)    All other components within normal limits  C-REACTIVE PROTEIN - Abnormal; Notable for the following:    CRP 2.8 (*)    All other components within normal limits  SEDIMENTATION RATE - Abnormal; Notable for the following:    Sed Rate 61 (*)    All other components within normal limits  CBC WITH DIFFERENTIAL/PLATELET    EKG  EKG Interpretation None       Radiology No results found.  Procedures Procedures (including critical care time)  Medications Ordered in ED Medications  methylPREDNISolone sodium succinate (SOLU-MEDROL) 125 mg/2 mL injection 125 mg (125 mg Intravenous Given 03/07/16 0102)  morphine 4 MG/ML injection 4 mg (4 mg Intravenous Given 03/07/16 0104)  aspirin chewable tablet 81 mg (81 mg Oral Given 03/07/16 0102)  iopamidol (ISOVUE-300) 61 % injection (75 mLs  Contrast Given 03/07/16 0206)     Initial Impression / Assessment and Plan / ED Course  I have reviewed the triage vital signs and the nursing  notes.  Pertinent labs & imaging results that were available during my care of the patient were reviewed by me and considered in my medical decision making (see chart for details).  Clinical Course   Given exquisitely tender facial/temporal region, highly suspicious for temporal arteritis. ESR, CRP, CBC, CMP, 60 mg IV solumedrol, and 4 mg morphine for pain. CT maxillofacial to r/o abcess.  Reassessed patient, and comfortable at this time. CT negative. CRP 2.8 and ESR 61, suggesting diagnosis of temporal arteritis. Discussed findings with pt with strict instructions to f/u with neurology for temporal artery biopsy in the next week. 2 week prescription for prednisone 60 mg QD provided, with instructions to also f/u with PCP in the next week for management of blood sugars given hx of diabetes. Discussed importance of taking daily prednisone and risks of visual loss if noncompliant. Patient verbalized understanding of her diagnosis and instructions for f/u.  Final Clinical Impressions(s) / ED Diagnoses   Final diagnoses:  Right-sided face pain    New Prescriptions New Prescriptions   No medications on file     Rosilyn Mings II, PA 03/07/16 1610    Everlene Balls, MD 03/07/16 0600

## 2016-03-07 NOTE — Telephone Encounter (Signed)
[name] with [facility] called to let us know that pt has refused and declined anymore therapy such as PT, OT. She has had it before and knows what to do and she can do it on her own. [name] is aware

## 2016-03-07 NOTE — Telephone Encounter (Signed)
Pt came by to get an appt with [facility] when I called over there they do not do biopsy so I tried to refer pt over to [facility] but they state pt has to be evaluated by neurology first to see if biopsy is done. [facility] has tried to contact patient and I have now left a detailed message for pt to call [facility] on Monday as they close at 12 on fridays and get an appt. Pt is feeling better and can eat and talk better

## 2016-03-07 NOTE — ED Notes (Signed)
Pt departed in NAD.  

## 2016-03-11 ENCOUNTER — Encounter: Payer: Self-pay | Admitting: Family Medicine

## 2016-03-14 ENCOUNTER — Telehealth: Payer: Self-pay | Admitting: Family Medicine

## 2016-03-14 MED ORDER — INSULIN GLULISINE 100 UNIT/ML IJ SOLN: 10.0000 [IU] | Freq: Two times a day (BID) | INTRAMUSCULAR | 0 refills | Status: DC

## 2016-03-14 MED ORDER — INSULIN DEGLUDEC 100 UNIT/ML ~~LOC~~ SOPN: 75.0000 [IU] | PEN_INJECTOR | Freq: Every day | SUBCUTANEOUS | 1 refills | Status: DC

## 2016-03-14 NOTE — Telephone Encounter (Signed)
Pt returned your  Call via VCM up front

## 2016-03-14 NOTE — Telephone Encounter (Addendum)
Called [employer] to check status of Pt Assistance, they stated they only need clarification on dosage for pt's Apidra, this was noted & faxed to [employer].  They states it will take about a week for pt to receive Apidra.  Rep to ship samples next week.  Pt informed

## 2016-03-14 NOTE — Telephone Encounter (Signed)
She needs refills on apidra and tresiba  [facility]

## 2016-03-14 NOTE — Telephone Encounter (Signed)
[employer] regarding [employer] Assistance, they states pt needs to have spent $1000 in prescriptions and a letter stating she doesn't not have private insurance then should be approved.  Pt informed. [name] today at pharmacy cost $162, sample given.

## 2016-03-14 NOTE — Telephone Encounter (Signed)
Pt was notified about pt's needing to call for referrals since they have tried to contact her once already.   [name] RiegerLaura please find out about the apidra samples

## 2016-03-14 NOTE — Telephone Encounter (Signed)
Please send in refills for her. Please find out when her appointment is with endocrinology. They will be taking over her diabetes care.

## 2016-03-14 NOTE — Telephone Encounter (Signed)
Refilled meds to pharmacy. Tried to call patient but left a message. Husband is coming in Monday and I will give him phone numbers and address of ENDO and Neurology for her to call and make her own appt.   [facility] (diabetes). Please call them at [phone] to schedule appointment Address: [address],                    [place], [place] [address]  [facility] (Jaw Pain).Please call them at [phone] to schedule appointment.   Address: [address]                [place], [place] [address]

## 2016-03-17 ENCOUNTER — Other Ambulatory Visit: Payer: Medicare PPO

## 2016-03-18 ENCOUNTER — Encounter: Payer: Self-pay | Admitting: Family Medicine

## 2016-03-20 ENCOUNTER — Encounter: Payer: Self-pay | Admitting: Family Medicine

## 2016-03-24 ENCOUNTER — Observation Stay (HOSPITAL_COMMUNITY)
Admission: EM | Admit: 2016-03-24 | Discharge: 2016-03-25 | Disposition: A | Discharge status: Home / Self Care | Payer: Medicare PPO | Attending: Family Medicine | Admitting: Family Medicine

## 2016-03-24 ENCOUNTER — Emergency Department (HOSPITAL_BASED_OUTPATIENT_CLINIC_OR_DEPARTMENT_OTHER)
Admit: 2016-03-24 | Discharge: 2016-03-24 | Disposition: A | Discharge status: Home / Self Care | Payer: Medicare PPO | Attending: Emergency Medicine | Admitting: Emergency Medicine

## 2016-03-24 ENCOUNTER — Encounter (HOSPITAL_COMMUNITY): Payer: Self-pay

## 2016-03-24 ENCOUNTER — Observation Stay (HOSPITAL_COMMUNITY): Payer: Medicare PPO

## 2016-03-24 DIAGNOSIS — Z993 Dependence on wheelchair: Secondary | ICD-10-CM | POA: Diagnosis not present

## 2016-03-24 DIAGNOSIS — I10 Essential (primary) hypertension: Secondary | ICD-10-CM | POA: Insufficient documentation

## 2016-03-24 DIAGNOSIS — F4320 Adjustment disorder, unspecified: Secondary | ICD-10-CM

## 2016-03-24 DIAGNOSIS — M109 Gout, unspecified: Secondary | ICD-10-CM | POA: Diagnosis not present

## 2016-03-24 DIAGNOSIS — E1140 Type 2 diabetes mellitus with diabetic neuropathy, unspecified: Secondary | ICD-10-CM | POA: Insufficient documentation

## 2016-03-24 DIAGNOSIS — Z6841 Body Mass Index (BMI) 40.0 and over, adult: Secondary | ICD-10-CM | POA: Diagnosis not present

## 2016-03-24 DIAGNOSIS — R609 Edema, unspecified: Secondary | ICD-10-CM

## 2016-03-24 DIAGNOSIS — G4733 Obstructive sleep apnea (adult) (pediatric): Secondary | ICD-10-CM | POA: Diagnosis not present

## 2016-03-24 DIAGNOSIS — F5101 Primary insomnia: Secondary | ICD-10-CM | POA: Diagnosis not present

## 2016-03-24 DIAGNOSIS — E118 Type 2 diabetes mellitus with unspecified complications: Secondary | ICD-10-CM | POA: Diagnosis not present

## 2016-03-24 DIAGNOSIS — Z794 Long term (current) use of insulin: Secondary | ICD-10-CM | POA: Diagnosis not present

## 2016-03-24 DIAGNOSIS — M79605 Pain in left leg: Secondary | ICD-10-CM

## 2016-03-24 DIAGNOSIS — J45909 Unspecified asthma, uncomplicated: Secondary | ICD-10-CM | POA: Diagnosis not present

## 2016-03-24 DIAGNOSIS — L03116 Cellulitis of left lower limb: Secondary | ICD-10-CM | POA: Diagnosis not present

## 2016-03-24 DIAGNOSIS — F4329 Adjustment disorder with other symptoms: Secondary | ICD-10-CM | POA: Diagnosis not present

## 2016-03-24 DIAGNOSIS — I878 Other specified disorders of veins: Secondary | ICD-10-CM | POA: Diagnosis not present

## 2016-03-24 DIAGNOSIS — Z8673 Personal history of transient ischemic attack (TIA), and cerebral infarction without residual deficits: Secondary | ICD-10-CM | POA: Diagnosis not present

## 2016-03-24 DIAGNOSIS — Z7982 Long term (current) use of aspirin: Secondary | ICD-10-CM | POA: Insufficient documentation

## 2016-03-24 DIAGNOSIS — E119 Type 2 diabetes mellitus without complications: Secondary | ICD-10-CM

## 2016-03-24 DIAGNOSIS — Z79899 Other long term (current) drug therapy: Secondary | ICD-10-CM | POA: Diagnosis not present

## 2016-03-24 HISTORY — DX: Migraine, unspecified, not intractable, without status migrainosus: G43.909

## 2016-03-24 HISTORY — DX: Low back pain: M54.5

## 2016-03-24 HISTORY — DX: Other chronic pain: G89.29

## 2016-03-24 HISTORY — DX: Headache, unspecified: R51.9

## 2016-03-24 HISTORY — DX: Anxiety disorder, unspecified: F41.9

## 2016-03-24 HISTORY — DX: Type 2 diabetes mellitus without complications: E11.9

## 2016-03-24 HISTORY — DX: Other chronic pain: M54.50

## 2016-03-24 HISTORY — DX: Headache: R51

## 2016-03-24 HISTORY — DX: Personal history of other diseases of the digestive system: Z87.19

## 2016-03-24 LAB — CBC WITH DIFFERENTIAL/PLATELET
BASOS ABS: 0 10*3/uL (ref 0.0–0.1)
Basophils Relative: 0 %
Eosinophils Absolute: 0.4 10*3/uL (ref 0.0–0.7)
Eosinophils Relative: 5 %
HEMATOCRIT: 39.8 % (ref 36.0–46.0)
HEMOGLOBIN: 13 g/dL (ref 12.0–15.0)
LYMPHS PCT: 22 %
Lymphs Abs: 2 10*3/uL (ref 0.7–4.0)
MCH: 30.1 pg (ref 26.0–34.0)
MCHC: 32.7 g/dL (ref 30.0–36.0)
MCV: 92.1 fL (ref 78.0–100.0)
MONO ABS: 0.7 10*3/uL (ref 0.1–1.0)
MONOS PCT: 8 %
NEUTROS ABS: 5.8 10*3/uL (ref 1.7–7.7)
Neutrophils Relative %: 65 %
Platelets: 274 10*3/uL (ref 150–400)
RBC: 4.32 MIL/uL (ref 3.87–5.11)
RDW: 15 % (ref 11.5–15.5)
WBC: 8.9 10*3/uL (ref 4.0–10.5)

## 2016-03-24 LAB — URINALYSIS, ROUTINE W REFLEX MICROSCOPIC
Bilirubin Urine: NEGATIVE
GLUCOSE, UA: NEGATIVE mg/dL
Hgb urine dipstick: NEGATIVE
KETONES UR: NEGATIVE mg/dL
LEUKOCYTES UA: NEGATIVE
Nitrite: NEGATIVE
PH: 7.5 (ref 5.0–8.0)
Protein, ur: NEGATIVE mg/dL
SPECIFIC GRAVITY, URINE: 1.015 (ref 1.005–1.030)

## 2016-03-24 LAB — COMPREHENSIVE METABOLIC PANEL
ALT: 17 U/L (ref 14–54)
AST: 16 U/L (ref 15–41)
Albumin: 3.2 g/dL — ABNORMAL LOW (ref 3.5–5.0)
Alkaline Phosphatase: 113 U/L (ref 38–126)
Anion gap: 7 (ref 5–15)
BUN: 7 mg/dL (ref 6–20)
CHLORIDE: 105 mmol/L (ref 101–111)
CO2: 25 mmol/L (ref 22–32)
Calcium: 9.3 mg/dL (ref 8.9–10.3)
Creatinine, Ser: 0.76 mg/dL (ref 0.44–1.00)
GFR calc Af Amer: >60 mL/min (ref >60)
GFR calc non Af Amer: >60 mL/min (ref >60)
GLUCOSE: 202 mg/dL — AB (ref 65–99)
POTASSIUM: 3.8 mmol/L (ref 3.5–5.1)
SODIUM: 137 mmol/L (ref 135–145)
Total Bilirubin: 0.3 mg/dL (ref 0.3–1.2)
Total Protein: 7.4 g/dL (ref 6.5–8.1)

## 2016-03-24 LAB — I-STAT CG4 LACTIC ACID, ED
Lactic Acid, Venous: 1.92 mmol/L (ref 0.5–1.9)
Lactic Acid, Venous: 2.08 mmol/L (ref 0.5–1.9)

## 2016-03-24 LAB — GLUCOSE, CAPILLARY
GLUCOSE-CAPILLARY: 173 mg/dL — AB (ref 65–99)
Glucose-Capillary: 231 mg/dL — ABNORMAL HIGH (ref 65–99)

## 2016-03-24 LAB — LACTIC ACID, PLASMA
LACTIC ACID, VENOUS: 1.6 mmol/L (ref 0.5–1.9)
LACTIC ACID, VENOUS: 1.8 mmol/L (ref 0.5–1.9)

## 2016-03-24 MED ORDER — NIFEDIPINE ER OSMOTIC RELEASE 90 MG PO TB24
90.0000 mg | ORAL_TABLET | Freq: Every day | ORAL | Status: DC
  Administered 2016-03-25: 90 mg via ORAL
  Filled 2016-03-24: qty 1

## 2016-03-24 MED ORDER — HYDROCODONE-ACETAMINOPHEN 5-325 MG PO TABS
1.0000 | ORAL_TABLET | ORAL | Status: DC | PRN
  Administered 2016-03-24: 2 via ORAL
  Filled 2016-03-24: qty 2

## 2016-03-24 MED ORDER — ATENOLOL 25 MG PO TABS: 25.0000 mg | ORAL_TABLET | Freq: Every day | ORAL | Status: DC

## 2016-03-24 MED ORDER — ATENOLOL 50 MG PO TABS: 25.0000 mg | ORAL_TABLET | Freq: Every day | ORAL | Status: DC

## 2016-03-24 MED ORDER — PNEUMOCOCCAL VAC POLYVALENT 25 MCG/0.5ML IJ INJ
0.5000 mL | INJECTION | INTRAMUSCULAR | Status: DC
  Filled 2016-03-24: qty 0.5

## 2016-03-24 MED ORDER — ONDANSETRON HCL 4 MG/2ML IJ SOLN: 4.0000 mg | Freq: Four times a day (QID) | INTRAMUSCULAR | Status: DC | PRN

## 2016-03-24 MED ORDER — ENOXAPARIN SODIUM 40 MG/0.4ML ~~LOC~~ SOLN: 40.0000 mg | SUBCUTANEOUS | Status: DC

## 2016-03-24 MED ORDER — VANCOMYCIN HCL IN DEXTROSE 1-5 GM/200ML-% IV SOLN
1000.0000 mg | Freq: Once | INTRAVENOUS | Status: AC
  Administered 2016-03-24: 1000 mg via INTRAVENOUS
  Filled 2016-03-24: qty 200

## 2016-03-24 MED ORDER — ALBUTEROL SULFATE (2.5 MG/3ML) 0.083% IN NEBU: 2.5000 mg | INHALATION_SOLUTION | Freq: Four times a day (QID) | RESPIRATORY_TRACT | Status: DC | PRN

## 2016-03-24 MED ORDER — ALLOPURINOL 300 MG PO TABS
300.0000 mg | ORAL_TABLET | Freq: Every day | ORAL | Status: DC
  Administered 2016-03-25: 300 mg via ORAL
  Filled 2016-03-24: qty 1

## 2016-03-24 MED ORDER — CYCLOBENZAPRINE HCL 10 MG PO TABS
10.0000 mg | ORAL_TABLET | Freq: Three times a day (TID) | ORAL | Status: DC | PRN
  Administered 2016-03-25: 10 mg via ORAL
  Filled 2016-03-24: qty 1

## 2016-03-24 MED ORDER — MAGNESIUM CITRATE PO SOLN: 1.0000 | Freq: Once | ORAL | Status: DC | PRN

## 2016-03-24 MED ORDER — GABAPENTIN 300 MG PO CAPS
300.0000 mg | ORAL_CAPSULE | Freq: Two times a day (BID) | ORAL | Status: DC
  Administered 2016-03-24 – 2016-03-25 (×2): 300 mg via ORAL
  Filled 2016-03-24 (×2): qty 1

## 2016-03-24 MED ORDER — ONDANSETRON HCL 4 MG PO TABS: 4.0000 mg | ORAL_TABLET | Freq: Four times a day (QID) | ORAL | Status: DC | PRN

## 2016-03-24 MED ORDER — HYDROXYZINE PAMOATE 50 MG PO CAPS
50.0000 mg | ORAL_CAPSULE | Freq: Three times a day (TID) | ORAL | Status: DC | PRN
  Filled 2016-03-24: qty 1

## 2016-03-24 MED ORDER — PIPERACILLIN-TAZOBACTAM 3.375 G IVPB 30 MIN
3.3750 g | Freq: Once | INTRAVENOUS | Status: AC
  Administered 2016-03-24: 3.375 g via INTRAVENOUS
  Filled 2016-03-24: qty 50

## 2016-03-24 MED ORDER — SODIUM CHLORIDE 0.9 % IV BOLUS (SEPSIS)
500.0000 mL | Freq: Once | INTRAVENOUS | Status: AC
  Administered 2016-03-24: 500 mL via INTRAVENOUS

## 2016-03-24 MED ORDER — ALBUTEROL SULFATE HFA 108 (90 BASE) MCG/ACT IN AERS: 2.0000 | INHALATION_SPRAY | Freq: Four times a day (QID) | RESPIRATORY_TRACT | Status: DC | PRN

## 2016-03-24 MED ORDER — SENNOSIDES-DOCUSATE SODIUM 8.6-50 MG PO TABS: 1.0000 | ORAL_TABLET | Freq: Every evening | ORAL | Status: DC | PRN

## 2016-03-24 MED ORDER — TRAZODONE HCL 50 MG PO TABS: 25.0000 mg | ORAL_TABLET | Freq: Every evening | ORAL | Status: DC | PRN

## 2016-03-24 MED ORDER — HYDROCERIN EX CREA
TOPICAL_CREAM | Freq: Every day | CUTANEOUS | Status: DC
  Administered 2016-03-24 – 2016-03-25 (×2): 1 via TOPICAL
  Filled 2016-03-24: qty 113

## 2016-03-24 MED ORDER — ASPIRIN 325 MG PO TABS
325.0000 mg | ORAL_TABLET | Freq: Every day | ORAL | Status: DC
  Administered 2016-03-25: 325 mg via ORAL
  Filled 2016-03-24: qty 1

## 2016-03-24 MED ORDER — BISACODYL 10 MG RE SUPP: 10.0000 mg | Freq: Every day | RECTAL | Status: DC | PRN

## 2016-03-24 MED ORDER — ACETAMINOPHEN 325 MG PO TABS: 650.0000 mg | ORAL_TABLET | Freq: Four times a day (QID) | ORAL | Status: DC | PRN

## 2016-03-24 MED ORDER — CLONAZEPAM 0.5 MG PO TABS
0.5000 mg | ORAL_TABLET | Freq: Every day | ORAL | Status: DC
  Administered 2016-03-25: 0.5 mg via ORAL
  Filled 2016-03-24: qty 1

## 2016-03-24 MED ORDER — SODIUM CHLORIDE 0.9 % IV SOLN
INTRAVENOUS | Status: DC
  Administered 2016-03-24: 100 mL/h via INTRAVENOUS
  Administered 2016-03-25: 04:00:00 via INTRAVENOUS

## 2016-03-24 MED ORDER — ATENOLOL 50 MG PO TABS
50.0000 mg | ORAL_TABLET | Freq: Every day | ORAL | Status: DC
  Administered 2016-03-25: 50 mg via ORAL
  Filled 2016-03-24: qty 1

## 2016-03-24 MED ORDER — INSULIN ASPART 100 UNIT/ML ~~LOC~~ SOLN
0.0000 [IU] | Freq: Three times a day (TID) | SUBCUTANEOUS | Status: DC
  Administered 2016-03-25: 3 [IU] via SUBCUTANEOUS

## 2016-03-24 MED ORDER — PIPERACILLIN-TAZOBACTAM 3.375 G IVPB
3.3750 g | Freq: Three times a day (TID) | INTRAVENOUS | Status: DC
  Administered 2016-03-24 – 2016-03-25 (×2): 3.375 g via INTRAVENOUS
  Filled 2016-03-24 (×4): qty 50

## 2016-03-24 MED ORDER — ACETAMINOPHEN 650 MG RE SUPP: 650.0000 mg | Freq: Four times a day (QID) | RECTAL | Status: DC | PRN

## 2016-03-24 MED ORDER — VANCOMYCIN HCL 10 G IV SOLR
1250.0000 mg | Freq: Two times a day (BID) | INTRAVENOUS | Status: DC
  Administered 2016-03-24 – 2016-03-25 (×2): 1250 mg via INTRAVENOUS
  Filled 2016-03-24 (×3): qty 1250

## 2016-03-24 NOTE — Progress Notes (Signed)
Pharmacy Antibiotic Note  [name] is a 65 y.o. female admitted on 03/24/2016 with cellulitis.  Pharmacy has been consulted for Vancomycin and zosyn dosing. -Vancomycin 1000mg  IV and zosyn 3.375gm given at ~ 12pm today, -CrCl ~ 70-80  Plan: -Zosyn 3.375gm IV q8h -Vancomycin 1250mg  IV q12h -Will follow renal function, cultures and clinical progress   Height: 5\' 5"  (165.1 cm) Weight: (!) 355 lb (161 kg) IBW/kg (Calculated) : 57  Temp (24hrs), Avg:98.3 F (36.8 C), Min:98.1 F (36.7 C), Max:98.5 F (36.9 C)   Recent Labs Lab 03/24/16 0831 03/24/16 0912 03/24/16 1230  WBC 8.9  --   --   CREATININE 0.76  --   --   LATICACIDVEN  --  1.92* 2.08*    Estimated Creatinine Clearance: 107.7 mL/min (by C-G formula based on SCr of 0.76 mg/dL).    Allergies  Allergen Reactions  . Ace Inhibitors Swelling  . Metformin And Related Other (See Comments)    chills    Antimicrobials this admission: 10/9 vanc>> 10/9 zosyn>>  Dose adjustments this admission:   Microbiology results: 10/9 L ankle wound 10/9 blood  10/9 urine  Thank you for allowing pharmacy to be a part of this patient's care.  Harland GermanAndrew Thecla Forgione, Pharm D 03/24/2016 7:25 PM

## 2016-03-24 NOTE — Consult Note (Addendum)
WOC Nurse wound consult note Reason for Consult: Consult requested for left leg cellulitis.  Pt previously had a full thickness wound to left leg last year which eventually healed.  Her leg became swollen and leaky this week and a cracked area was draining. Pt states she has an appointment [facility] the [facility] in a few weeks, they could not fit her in this week. Wound type:Left leg with generalized edema and dry cracked raised skin; appearance consistent with venous stasis changes and lymphadenia. Measurement: Affected area involved extends from foot to middle calf Wound bed: Full thickness cracked fissure area is near inner ankle; approx 2X.1X.2cm, difficult to visualize since it is surrounded by cracked skin. Drainage (amount, consistency, odor) Small amt yellow drainage, slight odor Dressing procedure/placement/frequency: Eucrin cream to treat dry cracked skin.  ABD pad to absorb drainage and ace wrap for light compression. Dressing change instructions provided for bedside nurses to apply today when the Eucerin arrives and change daily. Discussed plan of care with patient and she verbalized understanding. Please re-consult if further assistance is needed.  Thank-you,  Cammie Mcgeeawn Yash Cacciola MSN, RN, CWOCN, [place], CNS [phone]

## 2016-03-24 NOTE — ED Triage Notes (Signed)
Pt has recurring cellulitis to RLE pt states that two days ago the leg began to have a foul odor to it and she feels that it is reinfected

## 2016-03-24 NOTE — H&P (Signed)
History and Physical    [name] Kuunice Gibeau RUE:[id number] DOB: [birth year] DOA: 03/24/2016   PCP: Hetty BlendVickie Henson, NP   Patient coming from:  Home   Chief Complaint: Left leg cellulitis   HPI: [name] is a 65 y.o. female with medical history significant for DM, gout, priorly hospitalized for LLE cellulitis in 7/22017 discharged in 01/15/16, as well as discharged from Wound care clinic at the time as wound was not open, now presenting today with Left Lower extremity warmth, swelling and pain and erythema, with open purulent odorous wound, for "a few days". 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 Denies any respiratory complaints. Denies any chest pain or palpitations.   Denies nausea, heartburn or change in bowel habits  Denies abdominal pain. Appetite is normal. Denies any dysuria. Denies other abnormal skin rashes Denies any bleeding issues such as epistaxis, hematemesis, hematuria or hematochezia.    ED Course:  BP 99/60 (BP Location: Left Arm)   Pulse 75   Temp 98.5 F (36.9 C) (Oral)   Resp 20   Ht 5\' 5"  (1.651 m)   Wt (!) 161 kg (355 lb)   SpO2 99%   BMI 59.08 kg/m    lactic acid increased from 1.92->2.08 has received 500 mL of IV normal saline placed on IV Vanco and Zosyn after wound, urine and blood   cultures were drawn left lower extremity Dr. Theda Sersnegative for DVT white count dates pouring 9 hemoglobin 13 platelets 274 creatinine normal 0.76 Anion Gap  7  Review of Systems: As per HPI otherwise 10 point review of systems negative.   Past Medical History:  Diagnosis Date  . Arthritis   . Asthma   . Colonic polyp    last colonoscopy done in 2009 with normal results per medical record  . Depressive disorder   . Diabetes mellitus without complication (HCC)   . Gastric polyp   . Gout    has taken allopurinol 300mg  once  daily in past  . Hypertension   . Mixed hyperlipidemia    01/2016 Total chol 141, HDL 59, LDL 63, ration 1.1  . Osteoarthritis   . Sleep apnea   . TIA (transient ischemic attack)     Past Surgical History:  Procedure Laterality Date  . CARPAL TUNNEL RELEASE    . CHOLECYSTECTOMY    . [facility]  . PARTIAL HYSTERECTOMY  1982   uterus per record    Social History Social History   Social History  . Marital status: Married    Spouse name: N/A  . Number of children: N/A  . Years of education: N/A   Occupational History  . Not on file.   Social History Main Topics  . Smoking status: Never Smoker  . Smokeless tobacco: Never Used  . Alcohol use No  . Drug use: No  . Sexual activity: Not on file   Other Topics Concern  . Not on file   Social History Narrative  . No narrative on file     Allergies  Allergen Reactions  . Ace Inhibitors Swelling  . Metformin And Related Other (See Comments)    chills    No family history on file.    Prior to Admission medications   Medication Sig Start Date End Date Taking? Authorizing Provider  ACCU-CHEK FASTCLIX LANCETS MISC Test 3 times  a day. Patient uses accu-check meter 01/28/16   Avanell Shackleton, NP  albuterol (PROVENTIL HFA;VENTOLIN HFA) 108 (90 Base) MCG/ACT inhaler Inhale 2 puffs into the lungs every 6 (six) hours as needed for wheezing or shortness of breath.    Historical Provider, MD  allopurinol (ZYLOPRIM) 300 MG tablet Take 300 mg by mouth daily.    Historical Provider, MD  aspirin 325 MG tablet Take 325 mg by mouth daily.    Historical Provider, MD  atenolol (TENORMIN) 25 MG tablet Take 25-50 mg by mouth daily. Take 50 mg every morning  Take 25 mg every evening    Historical Provider, MD  clonazePAM (KLONOPIN) 0.5 MG tablet Take 1 tablet (0.5 mg total) by mouth daily as needed for anxiety. 02/25/16   Avanell Shackleton, NP  cyclobenzaprine (FLEXERIL) 10 MG tablet Take 10 mg by mouth 3  (three) times daily as needed for muscle spasms.    Historical Provider, MD  diclofenac sodium (VOLTAREN) 1 % GEL Apply 4 g topically 4 (four) times daily. 02/25/16   Avanell Shackleton, NP  gabapentin (NEURONTIN) 300 MG capsule Take 1 capsule (300 mg total) by mouth 2 (two) times daily. 01/31/16   Avanell Shackleton, NP  glucose blood (ACCU-CHEK AVIVA) test strip Test 3 times a day. Pt uses accu-chek aviva meter 01/28/16   Avanell Shackleton, NP  hydrocerin (EUCERIN) CREA Apply 1 application topically daily. 01/15/16   Leroy Sea, MD  hydrOXYzine (VISTARIL) 50 MG capsule Take 50 mg by mouth 3 (three) times daily as needed.    Historical Provider, MD  insulin degludec (TRESIBA FLEXTOUCH) 100 UNIT/ML SOPN FlexTouch Pen Inject 0.75 mLs (75 Units total) into the skin daily. 03/14/16   Avanell Shackleton, NP  insulin glulisine (APIDRA) 100 UNIT/ML injection Inject 0.1-0.15 mLs (10-15 Units total) into the skin 2 (two) times daily. <160 take 10 units >161 take 15 units 03/14/16   Avanell Shackleton, NP  magnesium gluconate (MAGONATE) 500 MG tablet Take 500 mg by mouth 2 (two) times daily.    Historical Provider, MD  NIFEdipine (PROCARDIA XL/ADALAT-CC) 90 MG 24 hr tablet Take 90 mg by mouth daily.    Historical Provider, MD    Physical Exam:    Vitals:   03/24/16 0945 03/24/16 1000 03/24/16 1030 03/24/16 1302  BP: 105/61 104/58 (!) 101/54 99/60  Pulse: 77 74 77 75  Resp: 23 25 24 20   Temp:      TempSrc:      SpO2: 98% 98% 96% 99%  Weight:      Height:           Constitutional: NAD, calm, comfortable  Vitals:   03/24/16 0945 03/24/16 1000 03/24/16 1030 03/24/16 1302  BP: 105/61 104/58 (!) 101/54 99/60  Pulse: 77 74 77 75  Resp: 23 25 24 20   Temp:      TempSrc:      SpO2: 98% 98% 96% 99%  Weight:      Height:       Eyes: PERRL, lids and conjunctivae normal ENMT: Mucous membranes are moist. Posterior pharynx clear of any exudate or lesions.Normal dentition.  Neck: normal, supple, no masses, no  thyromegaly Respiratory: clear to auscultation bilaterally, no wheezing, no crackles. Normal respiratory effort. No accessory muscle use.  Cardiovascular: Regular rate and rhythm, no murmurs / rubs / gallops. No extremity edema. 2+ pedal pulses. No carotid bruits.  Abdomen:  Obese no tenderness, no masses palpated. No hepatosplenomegaly. Bowel sounds positive.  Musculoskeletal: no clubbing / cyanosis. No joint deformity upper and lower extremities. Good ROM, no contractures. Normal muscle tone.  Skin: remarkable for chronic bilateral  dry scaling/eschard and a large  Inner tibial wound ,mildly odorous, purulent  About 4 cm  Neurologic: CN 2-12 grossly intact. Sensation intact, DTR normal. Strength 5/5 in all 4.  Psychiatric: Normal judgment and insight. Alert and oriented x 3. Normal mood.     Labs on Admission: I have personally reviewed following labs and imaging studies  CBC:  Recent Labs Lab 03/24/16 0831  WBC 8.9  NEUTROABS 5.8  HGB 13.0  HCT 39.8  MCV 92.1  PLT 274    Basic Metabolic Panel:  Recent Labs Lab 03/24/16 0831  NA 137  K 3.8  CL 105  CO2 25  GLUCOSE 202*  BUN 7  CREATININE 0.76  CALCIUM 9.3    GFR: Estimated Creatinine Clearance: 107.7 mL/min (by C-G formula based on SCr of 0.76 mg/dL).  Liver Function Tests:  Recent Labs Lab 03/24/16 0831  AST 16  ALT 17  ALKPHOS 113  BILITOT 0.3  PROT 7.4  ALBUMIN 3.2*   No results for input(s): LIPASE, AMYLASE in the last 168 hours. No results for input(s): AMMONIA in the last 168 hours.  Coagulation Profile: No results for input(s): INR, PROTIME in the last 168 hours.  Cardiac Enzymes: No results for input(s): CKTOTAL, CKMB, CKMBINDEX, TROPONINI in the last 168 hours.  BNP (last 3 results) No results for input(s): PROBNP in the last 8760 hours.  HbA1C: No results for input(s): HGBA1C in the last 72 hours.  CBG: No results for input(s): GLUCAP in the last 168 hours.  Lipid Profile: No  results for input(s): CHOL, HDL, LDLCALC, TRIG, CHOLHDL, LDLDIRECT in the last 72 hours.  Thyroid Function Tests: No results for input(s): TSH, T4TOTAL, FREET4, T3FREE, THYROIDAB in the last 72 hours.  Anemia Panel: No results for input(s): VITAMINB12, FOLATE, FERRITIN, TIBC, IRON, RETICCTPCT in the last 72 hours.  Urine analysis:    Component Value Date/Time   COLORURINE YELLOW 03/24/2016 0857   APPEARANCEUR CLEAR 03/24/2016 0857   LABSPEC 1.015 03/24/2016 0857   PHURINE 7.5 03/24/2016 0857   GLUCOSEU NEGATIVE 03/24/2016 0857   HGBUR NEGATIVE 03/24/2016 0857   BILIRUBINUR NEGATIVE 03/24/2016 0857   KETONESUR NEGATIVE 03/24/2016 0857   PROTEINUR NEGATIVE 03/24/2016 0857   NITRITE NEGATIVE 03/24/2016 0857   LEUKOCYTESUR NEGATIVE 03/24/2016 0857    Sepsis Labs: @LABRCNTIP (procalcitonin:4,lacticidven:4) )No results found for this or any previous visit (from the past 240 hour(s)).   Radiological Exams on Admission: No results found.  EKG: Independently reviewed.  Assessment/Plan Active Problems:   DM (diabetes mellitus) (HCC)   Hypertension   Morbid obesity (HCC)   Wheelchair dependent   Gout   Asthma   Left Lower Extremity Cellulitis, purulent, acute on  Chronic venous stasis.VSS, afebrile, WBC normal . Lactic acid 1.92->2.08, received 500cc  fluid. Non toxic appearing . Receiving IV Vanco and Zosyn Admit to med surg    Cellulitis order set X ray to rule out osteomyelitis -Continue broad spectrum antibiotics with vancomycin and Zosyn Blood cultures; check urine culture IVF Wound care consult  PT/OT  Cycle lactic acid   Type II Diabetes with neuropathy  Current blood sugar level is 202 Lab Results  Component Value Date   HGBA1C 9.3 (H) 01/11/2016  Hgb A1C Hold home oral diabetic medications.   SSI Heart healthy carb modified diet. Continue neurontin    Hypertension BP  101/54  Pulse 77 Continue home anti-hypertensive medications  In am, patient already  took meds prior to admission   History of asthma without exacerbation  Continue inhalers as needed    DVT prophylaxis: Lovenox Code Status:   Full   Family Communication:  Discussed with patient wife husband  Disposition Plan: Expect patient to be discharged to home after condition improves Consults called:   Wound care  Admission status:  Medsurg     Marcos Eke, PA-C [facility]   If 7PM-7AM, please contact night-coverage [email or web address] Password [email or web address]  03/24/2016, 1:18 PM

## 2016-03-24 NOTE — ED Notes (Signed)
[facility]

## 2016-03-24 NOTE — Progress Notes (Signed)
VASCULAR LAB PRELIMINARY  PRELIMINARY  PRELIMINARY  PRELIMINARY  Left lower extremity venous duplex completed.    Preliminary report:  There is no obvious evidence of DVT or SVT noted in the visualized veins of the left lower extremity.   Willine Schwalbe, RVT 03/24/2016, 11:15 AM

## 2016-03-25 ENCOUNTER — Encounter (HOSPITAL_COMMUNITY): Payer: Medicare PPO

## 2016-03-25 DIAGNOSIS — I10 Essential (primary) hypertension: Secondary | ICD-10-CM

## 2016-03-25 DIAGNOSIS — E1142 Type 2 diabetes mellitus with diabetic polyneuropathy: Secondary | ICD-10-CM

## 2016-03-25 DIAGNOSIS — L03116 Cellulitis of left lower limb: Secondary | ICD-10-CM | POA: Diagnosis not present

## 2016-03-25 LAB — CBC
HEMATOCRIT: 40.1 % (ref 36.0–46.0)
HEMOGLOBIN: 12.8 g/dL (ref 12.0–15.0)
MCH: 29.3 pg (ref 26.0–34.0)
MCHC: 31.9 g/dL (ref 30.0–36.0)
MCV: 91.8 fL (ref 78.0–100.0)
PLATELETS: 261 10*3/uL (ref 150–400)
RBC: 4.37 MIL/uL (ref 3.87–5.11)
RDW: 15.1 % (ref 11.5–15.5)
WBC: 7.3 10*3/uL (ref 4.0–10.5)

## 2016-03-25 LAB — COMPREHENSIVE METABOLIC PANEL
ALT: 17 U/L (ref 14–54)
AST: 15 U/L (ref 15–41)
Albumin: 3 g/dL — ABNORMAL LOW (ref 3.5–5.0)
Alkaline Phosphatase: 109 U/L (ref 38–126)
Anion gap: 7 (ref 5–15)
BUN: 5 mg/dL — ABNORMAL LOW (ref 6–20)
CHLORIDE: 107 mmol/L (ref 101–111)
CO2: 24 mmol/L (ref 22–32)
Calcium: 9.1 mg/dL (ref 8.9–10.3)
Creatinine, Ser: 0.84 mg/dL (ref 0.44–1.00)
Glucose, Bld: 203 mg/dL — ABNORMAL HIGH (ref 65–99)
POTASSIUM: 3.7 mmol/L (ref 3.5–5.1)
SODIUM: 138 mmol/L (ref 135–145)
Total Bilirubin: 0.6 mg/dL (ref 0.3–1.2)
Total Protein: 7.1 g/dL (ref 6.5–8.1)

## 2016-03-25 LAB — HEMOGLOBIN A1C
HEMOGLOBIN A1C: 9.7 % — AB (ref 4.8–5.6)
MEAN PLASMA GLUCOSE: 232 mg/dL

## 2016-03-25 LAB — URINE CULTURE

## 2016-03-25 LAB — GLUCOSE, CAPILLARY
GLUCOSE-CAPILLARY: 210 mg/dL — AB (ref 65–99)
Glucose-Capillary: 237 mg/dL — ABNORMAL HIGH (ref 65–99)

## 2016-03-25 MED ORDER — DOXYCYCLINE HYCLATE 100 MG PO TABS
100.0000 mg | ORAL_TABLET | Freq: Two times a day (BID) | ORAL | Status: DC
  Administered 2016-03-25: 100 mg via ORAL
  Filled 2016-03-25: qty 1

## 2016-03-25 MED ORDER — DOXYCYCLINE MONOHYDRATE 100 MG PO CAPS: 100.0000 mg | ORAL_CAPSULE | Freq: Two times a day (BID) | ORAL | 0 refills | Status: AC

## 2016-03-25 MED ORDER — ENOXAPARIN SODIUM 80 MG/0.8ML ~~LOC~~ SOLN: 0.5000 mg/kg | SUBCUTANEOUS | Status: DC

## 2016-03-25 NOTE — Discharge Summary (Signed)
Physician Discharge Summary  [name] ZOX:[id number] DOB: 05/21/1951 DOA: 03/24/2016  PCP: Hetty Blend, NP  Admit date: 03/24/2016 Discharge date: 03/27/2016  Admitted From: Home Disposition:  Home  Recommendations for Outpatient Follow-up:  1. Follow up with PCP in 1 week 2. Follow-up with wound care clinic on 10/16   Discharge Condition: Stable CODE STATUS: Full code   Brief/Interim Summary:  HPI written by Marlowe Kays, PA and attested by Dr. Shelly Flatten  [name] is a 65 y.o. female with medical history significant for DM, gout, priorly hospitalized for LLE cellulitis in 7/22017 discharged in 01/15/16, as well as discharged from Wound care clinic at the time as wound was not open, now presenting today with Left Lower extremity warmth, swelling and pain and erythema, with open purulent odorous wound, for "a few days". 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 Denies any respiratory complaints. Denies any chest pain or palpitations.   Denies nausea, heartburn or change in bowel habits  Denies abdominal pain. Appetite is normal. Denies any dysuria. Denies other abnormal skin rashes Denies any bleeding issues such as epistaxis, hematemesis, hematuria or hematochezia.    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 Upon evaluation in the morning, does not appear to be cellulitic. Small amount of weeping fluid. Area is non-erythematous. Patient initially started on vancomycin and zosyn. Switched to doxycycline on discharge and called in amoxicillin on 10/12 secondary to wound culture growing E. Coli. Patient was seen by wound care in the ED who provided recommendations and patient will follow-up in clinic next week.  Type II diabetes mellitus Sliding scale. Home medications held.  Hypertension Continued  nifedipine  History or asthma Continued home albuterol prn  Discharge Diagnoses:  Active Problems:   DM (diabetes mellitus) (HCC)   Hypertension   Morbid obesity (HCC)   Wheelchair dependent   Gout   Asthma   Left leg cellulitis   Cellulitis of left lower extremity   Diabetes mellitus with complication (HCC)   Primary insomnia   Adjustment disorder    Discharge Instructions     Medication List    STOP taking these medications   insulin glulisine 100 UNIT/ML injection Commonly known as:  APIDRA     TAKE these medications   albuterol 108 (90 Base) MCG/ACT inhaler Commonly known as:  PROVENTIL HFA;VENTOLIN HFA Inhale 2 puffs into the lungs every 6 (six) hours as needed for wheezing or shortness of breath.   allopurinol 300 MG tablet Commonly known as:  ZYLOPRIM Take 300 mg by mouth daily.   aspirin 325 MG tablet Take 325 mg by mouth daily.   atenolol 25 MG tablet Commonly known as:  TENORMIN Take 25-50 mg by mouth daily. Take 50 mg every morning  Take 25 mg every evening   clonazePAM 0.5 MG tablet Commonly known as:  KLONOPIN Take 1 tablet (0.5 mg total) by mouth daily as needed for anxiety.   cyclobenzaprine 10 MG tablet Commonly known as:  FLEXERIL Take 10 mg by mouth 3 (three) times daily as needed for muscle spasms.   diclofenac sodium 1 % Gel Commonly known as:  VOLTAREN Apply 4 g topically 4 (four) times daily.   doxycycline 100 MG capsule Commonly known as:  MONODOX Take 1 capsule (100 mg total) by mouth 2 (two) times daily.   gabapentin 300 MG capsule Commonly known as:  NEURONTIN  Take 1 capsule (300 mg total) by mouth 2 (two) times daily.   hydrocerin Crea Apply 1 application topically daily.   hydrOXYzine 50 MG capsule Commonly known as:  VISTARIL Take 50 mg by mouth 3 (three) times daily as needed for itching.   insulin degludec 100 UNIT/ML Sopn FlexTouch Pen Commonly known as:  TRESIBA FLEXTOUCH Inject 0.75 mLs (75 Units total)  into the skin daily.   NIFEdipine 90 MG 24 hr tablet Commonly known as:  PROCARDIA XL/ADALAT-CC Take 90 mg by mouth daily.      Follow-up Information    Hetty Blend, NP. Schedule an appointment as soon as possible for a visit on 03/31/2016.   Specialty:  Family Medicine Why:  Appointment with Hetty Blend NP is on 03/31/16 at [facility] information: [address] [place] [address] [phone]        [facility]             . Go on 03/31/2016.   Why:  8:00AM Contact information: [address] [place] [phone]         Allergies  Allergen Reactions  . Ace Inhibitors Swelling  . Metformin And Related Other (See Comments)    chills    Consultations:  Wound care   Procedures/Studies: Dg Tibia/fibula Left  Result Date: 03/24/2016 CLINICAL DATA:  Left lower extremity cellulitis.  Pain. EXAM: LEFT TIBIA AND FIBULA - 2 VIEW COMPARISON:  None. FINDINGS: The left tibia and fibula are intact. No evidence for fracture. Soft tissue irregularity along the medial lower leg. There appears to be mild degenerative changes along medial aspect of the left knee. Evidence for calcaneal spurring. IMPRESSION: No acute bone abnormality. Electronically Signed   By: Richarda Overlie M.D.   On: 03/24/2016 14:50   Ct Maxillofacial W Contrast  Result Date: 03/07/2016 CLINICAL DATA:  Initial evaluation for acute right-sided facial pain. EXAM: CT MAXILLOFACIAL WITH CONTRAST TECHNIQUE: Multidetector CT imaging of the maxillofacial structures was performed with intravenous contrast. Multiplanar CT image reconstructions were also generated. A small metallic BB was placed on the right temple in order to reliably differentiate right from left. CONTRAST:  75mL ISOVUE-300 IOPAMIDOL (ISOVUE-300) INJECTION 61% COMPARISON:  None. FINDINGS: Visualized portions of the brain are within normal limits. Globes and orbital soft  tissues normal. No appreciable soft tissue swelling identified within the face. Salivary glands including the parotid glands and submandibular glands are within normal limits. No inflammatory changes identified about the dentition. No acute osseous abnormality identified within the face. Paranasal sinuses are clear. Trace right mastoid effusion noted. Middle ear cavities are well pneumatized. Visualized portions of the oropharynx and supraglottic larynx are within normal limits. Degenerative changes noted within the upper cervical spine. IMPRESSION: No acute maxillofacial abnormality identified. Electronically Signed   By: Rise Mu M.D.   On: 03/07/2016 03:33     Subjective: Patient reports no problems overnight. No fevers, chills, nausea or vomiting.  Discharge Exam: Vitals:   03/25/16 0452 03/25/16 0930  BP: 118/63 114/60  Pulse: 79 76  Resp: 19   Temp: 98 F (36.7 C)    Vitals:   03/24/16 1415 03/24/16 2129 03/25/16 0452 03/25/16 0930  BP: 98/69 106/61 118/63 114/60  Pulse: 72 77 79 76  Resp: 20 16 19    Temp:  98.2 F (36.8 C) 98 F (36.7 C)   TempSrc:  Oral Oral   SpO2: 99% 93% 97%   Weight:  Height:        General: Pt is alert, awake, not in acute distress. Obese Cardiovascular: RRR, S1/S2 +, no rubs, no gallops Respiratory: CTA bilaterally, no wheezing, no rhonchi Abdominal: Soft, NT, ND, bowel sounds + Extremities: 2+ edema bilaterally. Both legs with chronic venous changes, worse on left. Left leg has severe lichenification with a small opened wound with minimal foul smelling light tan discharge. No erythema. No tenderness    The results of significant diagnostics from this hospitalization (including imaging, microbiology, ancillary and laboratory) are listed below for reference.     Microbiology: Recent Results (from the past 240 hour(s))  Urine culture     Status: Abnormal   Collection Time: 03/24/16  8:57 AM  Result Value Ref Range Status    Specimen Description URINE, CLEAN CATCH  Final   Special Requests NONE  Final   Culture MULTIPLE SPECIES PRESENT, SUGGEST RECOLLECTION (A)  Final   Report Status 03/25/2016 FINAL  Final  Culture, blood (Routine x 2)     Status: None (Preliminary result)   Collection Time: 03/24/16  9:50 AM  Result Value Ref Range Status   Specimen Description BLOOD LEFT ANTECUBITAL  Final   Special Requests BOTTLES DRAWN AEROBIC AND ANAEROBIC  10CC  Final   Culture NO GROWTH 2 DAYS  Final   Report Status PENDING  Incomplete  Wound or Superficial Culture     Status: None   Collection Time: 03/24/16 12:10 PM  Result Value Ref Range Status   Specimen Description WOUND LEFT ANKLE  Final   Special Requests Normal  Final   Gram Stain   Final    RARE WBC PRESENT, PREDOMINANTLY PMN ABUNDANT GRAM NEGATIVE RODS ABUNDANT GRAM POSITIVE COCCI IN CLUSTERS MODERATE GRAM POSITIVE RODS    Culture   Final    ABUNDANT ESCHERICHIA COLI ABUNDANT DIPHTHEROIDS(CORYNEBACTERIUM SPECIES) Standardized susceptibility testing for this organism is not available.    Report Status 03/26/2016 FINAL  Final   Organism ID, Bacteria ESCHERICHIA COLI  Final      Susceptibility   Escherichia coli - MIC*    AMPICILLIN <=2 SENSITIVE Sensitive     CEFAZOLIN <=4 SENSITIVE Sensitive     CEFEPIME <=1 SENSITIVE Sensitive     CEFTAZIDIME <=1 SENSITIVE Sensitive     CEFTRIAXONE <=1 SENSITIVE Sensitive     CIPROFLOXACIN <=0.25 SENSITIVE Sensitive     GENTAMICIN <=1 SENSITIVE Sensitive     IMIPENEM <=0.25 SENSITIVE Sensitive     TRIMETH/SULFA <=20 SENSITIVE Sensitive     AMPICILLIN/SULBACTAM <=2 SENSITIVE Sensitive     PIP/TAZO <=4 SENSITIVE Sensitive     Extended ESBL NEGATIVE Sensitive     * ABUNDANT ESCHERICHIA COLI  Culture, blood (Routine x 2)     Status: None (Preliminary result)   Collection Time: 03/24/16 12:20 PM  Result Value Ref Range Status   Specimen Description BLOOD LEFT ANTECUBITAL  Final   Special Requests BOTTLES  DRAWN AEROBIC ONLY 10CC  Final   Culture NO GROWTH 2 DAYS  Final   Report Status PENDING  Incomplete     Labs: BNP (last 3 results) No results for input(s): BNP in the last 8760 hours. Basic Metabolic Panel:  Recent Labs Lab 03/24/16 0831 03/25/16 0714  NA 137 138  K 3.8 3.7  CL 105 107  CO2 25 24  GLUCOSE 202* 203*  BUN 7 5*  CREATININE 0.76 0.84  CALCIUM 9.3 9.1   Liver Function Tests:  Recent Labs Lab 03/24/16 0831 03/25/16 0272  AST 16 15  ALT 17 17  ALKPHOS 113 109  BILITOT 0.3 0.6  PROT 7.4 7.1  ALBUMIN 3.2* 3.0*   No results for input(s): LIPASE, AMYLASE in the last 168 hours. No results for input(s): AMMONIA in the last 168 hours. CBC:  Recent Labs Lab 03/24/16 0831 03/25/16 0714  WBC 8.9 7.3  NEUTROABS 5.8  --   HGB 13.0 12.8  HCT 39.8 40.1  MCV 92.1 91.8  PLT 274 261   Cardiac Enzymes: No results for input(s): CKTOTAL, CKMB, CKMBINDEX, TROPONINI in the last 168 hours. BNP: Invalid input(s): POCBNP CBG:  Recent Labs Lab 03/24/16 1522 03/24/16 2123 03/25/16 0801 03/25/16 1238  GLUCAP 173* 231* 210* 237*   D-Dimer No results for input(s): DDIMER in the last 72 hours. Hgb A1c No results for input(s): HGBA1C in the last 72 hours. Lipid Profile No results for input(s): CHOL, HDL, LDLCALC, TRIG, CHOLHDL, LDLDIRECT in the last 72 hours. Thyroid function studies No results for input(s): TSH, T4TOTAL, T3FREE, THYROIDAB in the last 72 hours.  Invalid input(s): FREET3 Anemia work up No results for input(s): VITAMINB12, FOLATE, FERRITIN, TIBC, IRON, RETICCTPCT in the last 72 hours. Urinalysis    Component Value Date/Time   COLORURINE YELLOW 03/24/2016 0857   APPEARANCEUR CLEAR 03/24/2016 0857   LABSPEC 1.015 03/24/2016 0857   PHURINE 7.5 03/24/2016 0857   GLUCOSEU NEGATIVE 03/24/2016 0857   HGBUR NEGATIVE 03/24/2016 0857   BILIRUBINUR NEGATIVE 03/24/2016 0857   KETONESUR NEGATIVE 03/24/2016 0857   PROTEINUR NEGATIVE 03/24/2016 0857    NITRITE NEGATIVE 03/24/2016 0857   LEUKOCYTESUR NEGATIVE 03/24/2016 0857   Sepsis Labs Invalid input(s): PROCALCITONIN,  WBC,  LACTICIDVEN Microbiology Recent Results (from the past 240 hour(s))  Urine culture     Status: Abnormal   Collection Time: 03/24/16  8:57 AM  Result Value Ref Range Status   Specimen Description URINE, CLEAN CATCH  Final   Special Requests NONE  Final   Culture MULTIPLE SPECIES PRESENT, SUGGEST RECOLLECTION (A)  Final   Report Status 03/25/2016 FINAL  Final  Culture, blood (Routine x 2)     Status: None (Preliminary result)   Collection Time: 03/24/16  9:50 AM  Result Value Ref Range Status   Specimen Description BLOOD LEFT ANTECUBITAL  Final   Special Requests BOTTLES DRAWN AEROBIC AND ANAEROBIC  10CC  Final   Culture NO GROWTH 2 DAYS  Final   Report Status PENDING  Incomplete  Wound or Superficial Culture     Status: None   Collection Time: 03/24/16 12:10 PM  Result Value Ref Range Status   Specimen Description WOUND LEFT ANKLE  Final   Special Requests Normal  Final   Gram Stain   Final    RARE WBC PRESENT, PREDOMINANTLY PMN ABUNDANT GRAM NEGATIVE RODS ABUNDANT GRAM POSITIVE COCCI IN CLUSTERS MODERATE GRAM POSITIVE RODS    Culture   Final    ABUNDANT ESCHERICHIA COLI ABUNDANT DIPHTHEROIDS(CORYNEBACTERIUM SPECIES) Standardized susceptibility testing for this organism is not available.    Report Status 03/26/2016 FINAL  Final   Organism ID, Bacteria ESCHERICHIA COLI  Final      Susceptibility   Escherichia coli - MIC*    AMPICILLIN <=2 SENSITIVE Sensitive     CEFAZOLIN <=4 SENSITIVE Sensitive     CEFEPIME <=1 SENSITIVE Sensitive     CEFTAZIDIME <=1 SENSITIVE Sensitive     CEFTRIAXONE <=1 SENSITIVE Sensitive     CIPROFLOXACIN <=0.25 SENSITIVE Sensitive     GENTAMICIN <=1 SENSITIVE Sensitive  IMIPENEM <=0.25 SENSITIVE Sensitive     TRIMETH/SULFA <=20 SENSITIVE Sensitive     AMPICILLIN/SULBACTAM <=2 SENSITIVE Sensitive     PIP/TAZO <=4  SENSITIVE Sensitive     Extended ESBL NEGATIVE Sensitive     * ABUNDANT ESCHERICHIA COLI  Culture, blood (Routine x 2)     Status: None (Preliminary result)   Collection Time: 03/24/16 12:20 PM  Result Value Ref Range Status   Specimen Description BLOOD LEFT ANTECUBITAL  Final   Special Requests BOTTLES DRAWN AEROBIC ONLY 10CC  Final   Culture NO GROWTH 2 DAYS  Final   Report Status PENDING  Incomplete     Time coordinating discharge: Over 30 minutes  SIGNED:   Jacquelin Hawking, MD [facility] 03/27/2016, 9:30 AM Pager [phone]  If 7PM-7AM, please contact night-coverage [email or web address] Password [email or web address]

## 2016-03-25 NOTE — Evaluation (Signed)
Occupational Therapy Evaluation and Discharge Summary Patient Details Name: [name] MRN: [id number] DOB: 1951-02-26 Today's Date: 03/25/2016    History of Present Illness Pt is a 65 yo female admitted for reoccuring L LE cellulitis.  Pt has asthma, arthritis, depression, HTN, gout and h/o TIAs.   Clinical Impression   Pt admitted with the above diagnosis and overall is at baseline with her functional mobility and adls. Husband will be at home with pt and son as well if needed. Stressed that she keep her LLE elevated.  No further acute OT needs at this time.    Follow Up Recommendations  No OT follow up;Supervision - Intermittent    Equipment Recommendations  None recommended by OT    Recommendations for Other Services       Precautions / Restrictions Precautions Precautions: Fall Required Braces or Orthoses: Other Brace/Splint Other Brace/Splint: ace wrap to LLE. Restrictions Weight Bearing Restrictions: No      Mobility Bed Mobility               General bed mobility comments: pt on EOB on arrival.  Transfers Overall transfer level: Modified independent Equipment used: Straight cane             General transfer comment: no physical assist needed.    Balance Overall balance assessment: No apparent balance deficits (not formally assessed)                                          ADL Overall ADL's : At baseline                                       General ADL Comments: Pt appears to be at baseline with adls. Husband assists with adls at home when needed and can continue to do so. Pt is primarly in the w/c most of the day and does not walk.  Uses cane to do pivot transfes.     Vision Vision Assessment?: No apparent visual deficits   Perception     Praxis      Pertinent Vitals/Pain Pain Assessment: Faces Faces Pain Scale: Hurts little more Pain Location: L LE Pain Descriptors / Indicators:  Aching;Heaviness Pain Intervention(s): Monitored during session;Repositioned     Hand Dominance Right   Extremity/Trunk Assessment Upper Extremity Assessment Upper Extremity Assessment: Overall WFL for tasks assessed       Cervical / Trunk Assessment Cervical / Trunk Assessment: Normal   Communication Communication Communication: No difficulties   Cognition Arousal/Alertness: Awake/alert Behavior During Therapy: WFL for tasks assessed/performed Overall Cognitive Status: Within Functional Limits for tasks assessed                     General Comments       Exercises       Shoulder Instructions      Home Living Family/patient expects to be discharged to:: Private residence Living Arrangements: Spouse/significant other Available Help at Discharge: Family;Available 24 hours/day Type of Home: Apartment Home Access: Level entry     Home Layout: One level     Bathroom Shower/Tub: Tub/shower unit;Curtain Shower/tub characteristics: Engineer, building services: Standard     Home Equipment: Environmental consultant - 2 wheels;Wheelchair - Engineer, technical sales - power;Bedside commode   Additional Comments: Pt primarily uses the w/c to mobilize.  Prior Functioning/Environment Level of Independence: Needs assistance  Gait / Transfers Assistance Needed: pt only transfers at baseline.   ADL's / Homemaking Assistance Needed: pt able to perform grooming tasks, but needs A for other ADLs and all homemaking tasks.     Comments: Pt has reacher/sock aid etc to assist with adls but usually husband just assists her.        OT Problem List:     OT Treatment/Interventions:      OT Goals(Current goals can be found in the care plan section) Acute Rehab OT Goals Patient Stated Goal: to go home...now. OT Goal Formulation: All assessment and education complete, DC therapy  OT Frequency:     Barriers to D/C:            Co-evaluation              End of Session Nurse  Communication: Mobility status  Activity Tolerance: Patient tolerated treatment well Patient left: in bed;with call bell/phone within reach;with family/visitor present   Time: 1213-1225 OT Time Calculation (min): 12 min Charges:  OT General Charges $OT Visit: 1 Procedure OT Evaluation $OT Eval Moderate Complexity: 1 Procedure G-Codes: OT G-codes **NOT FOR INPATIENT CLASS** Functional Assessment Tool Used: clinical judgement Functional Limitation: Self care Self Care Current Status (Z6109(G8987): At least 1 percent but less than 20 percent impaired, limited or restricted Self Care Goal Status (U0454(G8988): At least 1 percent but less than 20 percent impaired, limited or restricted Self Care Discharge Status [phone](G8989): At least 1 percent but less than 20 percent impaired, limited or restricted  Hope BuddsJones, [place] 03/25/2016, 12:33 PM  [phone]

## 2016-03-25 NOTE — Progress Notes (Signed)
NURSING PROGRESS NOTE  [name] [id number] Discharge Data: 03/25/2016 1:56 PM Attending Provider: Narda Bondsalph A Nettey, MD WUJ:[id number]:[name] Suezanne JacquetHenson, NP     [name] KuEunice Reali to be D/C'd Home per MD order.  Discussed with the patient the After Visit Summary and all questions fully answered. All IV's discontinued with no bleeding noted. All belongings returned to patient for patient to take home. Reviewed with the patient and her family her dressing change instructions.  Last Vital Signs:  Blood pressure 114/60, pulse 76, temperature 98 F (36.7 C), temperature source Oral, resp. rate 19, height 5\' 5"  (1.651 m), weight (!) 161 kg (355 lb), SpO2 97 %.  Discharge Medication List   Medication List    STOP taking these medications   insulin glulisine 100 UNIT/ML injection Commonly known as:  APIDRA     TAKE these medications   albuterol 108 (90 Base) MCG/ACT inhaler Commonly known as:  PROVENTIL HFA;VENTOLIN HFA Inhale 2 puffs into the lungs every 6 (six) hours as needed for wheezing or shortness of breath.   allopurinol 300 MG tablet Commonly known as:  ZYLOPRIM Take 300 mg by mouth daily.   aspirin 325 MG tablet Take 325 mg by mouth daily.   atenolol 25 MG tablet Commonly known as:  TENORMIN Take 25-50 mg by mouth daily. Take 50 mg every morning  Take 25 mg every evening   clonazePAM 0.5 MG tablet Commonly known as:  KLONOPIN Take 1 tablet (0.5 mg total) by mouth daily as needed for anxiety.   cyclobenzaprine 10 MG tablet Commonly known as:  FLEXERIL Take 10 mg by mouth 3 (three) times daily as needed for muscle spasms.   diclofenac sodium 1 % Gel Commonly known as:  VOLTAREN Apply 4 g topically 4 (four) times daily.   doxycycline 100 MG capsule Commonly known as:  MONODOX Take 1 capsule (100 mg total) by mouth 2 (two) times daily.   gabapentin 300 MG capsule Commonly known as:  NEURONTIN Take 1 capsule (300 mg total) by mouth 2 (two) times daily.   hydrocerin Crea Apply 1  application topically daily.   hydrOXYzine 50 MG capsule Commonly known as:  VISTARIL Take 50 mg by mouth 3 (three) times daily as needed for itching.   insulin degludec 100 UNIT/ML Sopn FlexTouch Pen Commonly known as:  TRESIBA FLEXTOUCH Inject 0.75 mLs (75 Units total) into the skin daily.   NIFEdipine 90 MG 24 hr tablet Commonly known as:  PROCARDIA XL/ADALAT-CC Take 90 mg by mouth daily.

## 2016-03-26 ENCOUNTER — Telehealth: Payer: Self-pay | Admitting: Family Medicine

## 2016-03-26 LAB — AEROBIC CULTURE W GRAM STAIN (SUPERFICIAL SPECIMEN): Special Requests: NORMAL

## 2016-03-26 LAB — AEROBIC CULTURE  (SUPERFICIAL SPECIMEN)

## 2016-03-26 NOTE — Telephone Encounter (Signed)
Left message for pt to call. Pt needs a TOC call documented and hospital follow up appt made by the hospital confirmed.

## 2016-03-27 ENCOUNTER — Telehealth: Payer: Self-pay | Admitting: Family Medicine

## 2016-03-27 ENCOUNTER — Ambulatory Visit (INDEPENDENT_AMBULATORY_CARE_PROVIDER_SITE_OTHER): Payer: Self-pay | Admitting: Neurology

## 2016-03-27 DIAGNOSIS — R6884 Jaw pain: Secondary | ICD-10-CM | POA: Insufficient documentation

## 2016-03-27 MED ORDER — AMOXICILLIN 500 MG PO CAPS: 500.0000 mg | ORAL_CAPSULE | Freq: Two times a day (BID) | ORAL | 0 refills | Status: DC

## 2016-03-27 NOTE — Progress Notes (Signed)
She needs to come in so I can evaluate her.

## 2016-03-27 NOTE — Progress Notes (Signed)
The patient was referred by the ED to our office for a temporal artery biopsy for probable temporal arteritis. However, none of the physicians in this office performs that type of procedure. She will follow-up with either her wound care or primary care physicians.

## 2016-03-27 NOTE — Telephone Encounter (Signed)
Physician Telephone Note  Reason for call: Change antibiotic therapy  Discussion with patient: Left voicemail to pick up prescription from pharmacy. Switched from doxycycline to amoxicillin to cover E. Coli infection. Medication called into pharmacy  Jacquelin Hawkingalph Fortunata Betty, MD [facility] 03/27/2016, 9:20 AM Pager: [phone]

## 2016-03-27 NOTE — Telephone Encounter (Signed)
Called [employer] & pt has been approved & Apidra will ship next week

## 2016-03-29 LAB — CULTURE, BLOOD (ROUTINE X 2)
Culture: NO GROWTH
Culture: NO GROWTH

## 2016-03-31 ENCOUNTER — Encounter (HOSPITAL_BASED_OUTPATIENT_CLINIC_OR_DEPARTMENT_OTHER): Payer: Medicare PPO | Attending: Internal Medicine

## 2016-03-31 ENCOUNTER — Encounter: Payer: Self-pay | Admitting: Family Medicine

## 2016-03-31 ENCOUNTER — Ambulatory Visit (INDEPENDENT_AMBULATORY_CARE_PROVIDER_SITE_OTHER): Payer: Medicare PPO | Admitting: Family Medicine

## 2016-03-31 VITALS — BP 122/82 | HR 83

## 2016-03-31 DIAGNOSIS — E1165 Type 2 diabetes mellitus with hyperglycemia: Secondary | ICD-10-CM

## 2016-03-31 DIAGNOSIS — I10 Essential (primary) hypertension: Secondary | ICD-10-CM | POA: Diagnosis not present

## 2016-03-31 DIAGNOSIS — Z794 Long term (current) use of insulin: Secondary | ICD-10-CM | POA: Diagnosis not present

## 2016-03-31 DIAGNOSIS — L03116 Cellulitis of left lower limb: Secondary | ICD-10-CM | POA: Diagnosis not present

## 2016-03-31 DIAGNOSIS — E10621 Type 1 diabetes mellitus with foot ulcer: Secondary | ICD-10-CM | POA: Diagnosis not present

## 2016-03-31 DIAGNOSIS — IMO0002 Reserved for concepts with insufficient information to code with codable children: Secondary | ICD-10-CM

## 2016-03-31 DIAGNOSIS — I890 Lymphedema, not elsewhere classified: Secondary | ICD-10-CM | POA: Insufficient documentation

## 2016-03-31 DIAGNOSIS — Z9119 Patient's noncompliance with other medical treatment and regimen: Secondary | ICD-10-CM | POA: Diagnosis not present

## 2016-03-31 DIAGNOSIS — E10622 Type 1 diabetes mellitus with other skin ulcer: Secondary | ICD-10-CM | POA: Insufficient documentation

## 2016-03-31 DIAGNOSIS — Z993 Dependence on wheelchair: Secondary | ICD-10-CM | POA: Diagnosis not present

## 2016-03-31 DIAGNOSIS — Z91199 Patient's noncompliance with other medical treatment and regimen due to unspecified reason: Secondary | ICD-10-CM | POA: Insufficient documentation

## 2016-03-31 DIAGNOSIS — E1151 Type 2 diabetes mellitus with diabetic peripheral angiopathy without gangrene: Secondary | ICD-10-CM | POA: Diagnosis not present

## 2016-03-31 DIAGNOSIS — L97521 Non-pressure chronic ulcer of other part of left foot limited to breakdown of skin: Secondary | ICD-10-CM | POA: Insufficient documentation

## 2016-03-31 DIAGNOSIS — Z6841 Body Mass Index (BMI) 40.0 and over, adult: Secondary | ICD-10-CM | POA: Insufficient documentation

## 2016-03-31 DIAGNOSIS — L97821 Non-pressure chronic ulcer of other part of left lower leg limited to breakdown of skin: Secondary | ICD-10-CM | POA: Insufficient documentation

## 2016-03-31 NOTE — Progress Notes (Signed)
Subjective:    Patient ID: [name], female    DOB: 01-Nov-1950, 65 y.o.   MRN: [id number]  HPI  Chief Complaint  Patient presents with  . hospital    hospital follow-up   She is here for hospital follow up. She was hospitalized for cellulitis of LLE. She is currently on antibiotic therapy for left leg wound and is under the care of the wound care center for cellulitis and management of wound. She was there earlier today and has a new dressing to her LLE.  Denies fever, chills, chest pain, shortness of breath, cough, abdominal pain, N/V/D.  Denies numbness, tingling, weakness of extremities.   States she has not been checking her blood sugar and has not been taking her medication for diabetes on a regular basis.  States she has supplies and meter at home and is aware that she should keep an eye on her blood sugar but just does not check it.  Has not had apidra for past 2 weeks. States she is taking daily [place]-Bissauresiba. 75 units daily.  She states she cannot afford to go to the endocrinologist as I recommended. States she might be able to start going next month.  .  Neurologist- she has not seen the neurologist and states she cannot go. Symptoms including right temporal and jaw pain have cleared up. Denies any problems with this currently.   States she does not want care, she is tired of having to deal with doctor offices and so many health problems. States she does not want my help and she did not make the appointment for today's visit with me but the hospital made it.   States she does not care if "they chop off my left leg, then I won't have to deal with these problems".   She and her husband recently relocated here from [facility] because she states their son was "supposed to take care of us so we moved here and now we are having to take care of him". Patient states her goal is to [facility] and to her previous providers.   Reviewed allergies, medications, past medical, surgical, and  social history.   Review of Systems Pertinent positives and negatives in the history of present illness.     Objective:   Physical Exam BP 122/82   Pulse 83   Alert and oriented and in no acute distress. Dressing clean, dry and intact to LLE. Not otherwise examined.      Assessment & Plan:  Essential hypertension  Uncontrolled diabetes mellitus type 2 with peripheral artery disease (HCC)  Wheelchair dependent  Cellulitis of left lower extremity  Personal history of noncompliance with medical treatment, presenting hazards to health  Discussed that she is not taking medications, checking her blood sugars, and is not following recommendations to take care of herself. Asked the patient multiple times what her expectations are for this provider-patient relationship and how I can help her take better care of herself and her response several times was that she did not want my care and she is tired of having health problems.  Advised patient that her chronic health conditions will only get worse and may lead to serious consequences including death. She verbalized understanding. Her husband was with her and aware of this conversation. My office manager Lafonda MossesDiana was also brought into the room to overhear the conversation. Expressed that I am concerned that she does not appear to be interested in following my recommendations or in taking care  of herself.  Recommend that since she feels this way that we should consider terminating our provider-patient relationship going forward and that she may want to find a different provider to care for her.  Patient would like to try checking blood sugars twice daily for the next 2 weeks and return for a follow up. I agree that if she puts in the work that I will see her in 2 weeks but if she chooses to not make an effort then I will no longer be able to care for her.  Recommend she continue going to the wound care center for left leg wound.  Blood pressure  appears to be controlled and I recommend she continue taking her medication.

## 2016-04-01 NOTE — Telephone Encounter (Signed)
Called [employer] Apid[employer] approved & shipped & will be at our office tomorrow.  PT informed & she will also bring print out for additional $ spent on prescriptions as pt only had spent $999 not $1000 and must have $1000 spent for approval [employer] for Guinea-Bissauresiba

## 2016-04-02 ENCOUNTER — Emergency Department (HOSPITAL_COMMUNITY): Payer: Medicare PPO

## 2016-04-02 ENCOUNTER — Observation Stay (HOSPITAL_COMMUNITY): Payer: Medicare PPO

## 2016-04-02 ENCOUNTER — Encounter (HOSPITAL_COMMUNITY): Payer: Self-pay | Admitting: Emergency Medicine

## 2016-04-02 ENCOUNTER — Observation Stay (HOSPITAL_COMMUNITY)
Admission: EM | Admit: 2016-04-02 | Discharge: 2016-04-03 | Disposition: A | Discharge status: Home / Self Care | Payer: Medicare PPO | Attending: Internal Medicine | Admitting: Internal Medicine

## 2016-04-02 DIAGNOSIS — Z8673 Personal history of transient ischemic attack (TIA), and cerebral infarction without residual deficits: Secondary | ICD-10-CM | POA: Diagnosis not present

## 2016-04-02 DIAGNOSIS — Z7982 Long term (current) use of aspirin: Secondary | ICD-10-CM | POA: Insufficient documentation

## 2016-04-02 DIAGNOSIS — Z79899 Other long term (current) drug therapy: Secondary | ICD-10-CM | POA: Insufficient documentation

## 2016-04-02 DIAGNOSIS — M109 Gout, unspecified: Secondary | ICD-10-CM | POA: Diagnosis present

## 2016-04-02 DIAGNOSIS — L03116 Cellulitis of left lower limb: Secondary | ICD-10-CM

## 2016-04-02 DIAGNOSIS — M79605 Pain in left leg: Principal | ICD-10-CM | POA: Diagnosis present

## 2016-04-02 DIAGNOSIS — Z993 Dependence on wheelchair: Secondary | ICD-10-CM

## 2016-04-02 DIAGNOSIS — R2681 Unsteadiness on feet: Secondary | ICD-10-CM

## 2016-04-02 DIAGNOSIS — R29898 Other symptoms and signs involving the musculoskeletal system: Secondary | ICD-10-CM | POA: Diagnosis not present

## 2016-04-02 DIAGNOSIS — J45909 Unspecified asthma, uncomplicated: Secondary | ICD-10-CM | POA: Insufficient documentation

## 2016-04-02 DIAGNOSIS — E119 Type 2 diabetes mellitus without complications: Secondary | ICD-10-CM | POA: Diagnosis not present

## 2016-04-02 DIAGNOSIS — Z794 Long term (current) use of insulin: Secondary | ICD-10-CM | POA: Diagnosis not present

## 2016-04-02 DIAGNOSIS — F5101 Primary insomnia: Secondary | ICD-10-CM | POA: Diagnosis present

## 2016-04-02 DIAGNOSIS — I10 Essential (primary) hypertension: Secondary | ICD-10-CM | POA: Insufficient documentation

## 2016-04-02 DIAGNOSIS — E1142 Type 2 diabetes mellitus with diabetic polyneuropathy: Secondary | ICD-10-CM

## 2016-04-02 DIAGNOSIS — E118 Type 2 diabetes mellitus with unspecified complications: Secondary | ICD-10-CM

## 2016-04-02 DIAGNOSIS — M79606 Pain in leg, unspecified: Secondary | ICD-10-CM

## 2016-04-02 LAB — GLUCOSE, CAPILLARY
Glucose-Capillary: 149 mg/dL — ABNORMAL HIGH (ref 65–99)
Glucose-Capillary: 160 mg/dL — ABNORMAL HIGH (ref 65–99)

## 2016-04-02 LAB — COMPREHENSIVE METABOLIC PANEL
ALT: 14 U/L (ref 14–54)
AST: 17 U/L (ref 15–41)
Albumin: 3.2 g/dL — ABNORMAL LOW (ref 3.5–5.0)
Alkaline Phosphatase: 102 U/L (ref 38–126)
Anion gap: 12 (ref 5–15)
BILIRUBIN TOTAL: 0.4 mg/dL (ref 0.3–1.2)
BUN: 10 mg/dL (ref 6–20)
CHLORIDE: 103 mmol/L (ref 101–111)
CO2: 22 mmol/L (ref 22–32)
CREATININE: 1.15 mg/dL — AB (ref 0.44–1.00)
Calcium: 9.2 mg/dL (ref 8.9–10.3)
GFR, EST AFRICAN AMERICAN: 56 mL/min — AB (ref >60)
GFR, EST NON AFRICAN AMERICAN: 48 mL/min — AB (ref >60)
Glucose, Bld: 203 mg/dL — ABNORMAL HIGH (ref 65–99)
POTASSIUM: 3.5 mmol/L (ref 3.5–5.1)
Sodium: 137 mmol/L (ref 135–145)
TOTAL PROTEIN: 7.9 g/dL (ref 6.5–8.1)

## 2016-04-02 LAB — CBC WITH DIFFERENTIAL/PLATELET
Basophils Absolute: 0 10*3/uL (ref 0.0–0.1)
Basophils Relative: 0 %
EOS PCT: 4 %
Eosinophils Absolute: 0.3 10*3/uL (ref 0.0–0.7)
HCT: 39.3 % (ref 36.0–46.0)
Hemoglobin: 13 g/dL (ref 12.0–15.0)
LYMPHS ABS: 1.4 10*3/uL (ref 0.7–4.0)
LYMPHS PCT: 18 %
MCH: 29.7 pg (ref 26.0–34.0)
MCHC: 33.1 g/dL (ref 30.0–36.0)
MCV: 89.9 fL (ref 78.0–100.0)
MONO ABS: 0.6 10*3/uL (ref 0.1–1.0)
MONOS PCT: 7 %
Neutro Abs: 5.7 10*3/uL (ref 1.7–7.7)
Neutrophils Relative %: 71 %
PLATELETS: 291 10*3/uL (ref 150–400)
RBC: 4.37 MIL/uL (ref 3.87–5.11)
RDW: 15 % (ref 11.5–15.5)
WBC: 7.9 10*3/uL (ref 4.0–10.5)

## 2016-04-02 LAB — CBG MONITORING, ED: GLUCOSE-CAPILLARY: 161 mg/dL — AB (ref 65–99)

## 2016-04-02 MED ORDER — ACETAMINOPHEN 650 MG RE SUPP: 650.0000 mg | Freq: Four times a day (QID) | RECTAL | Status: DC | PRN

## 2016-04-02 MED ORDER — DICLOFENAC SODIUM 1 % TD GEL
4.0000 g | Freq: Four times a day (QID) | TRANSDERMAL | Status: DC
  Administered 2016-04-03: 4 g via TOPICAL
  Filled 2016-04-02: qty 100

## 2016-04-02 MED ORDER — MAGNESIUM CITRATE PO SOLN
1.0000 | Freq: Once | ORAL | Status: DC | PRN
  Filled 2016-04-02: qty 296

## 2016-04-02 MED ORDER — CYCLOBENZAPRINE HCL 10 MG PO TABS
5.0000 mg | ORAL_TABLET | Freq: Once | ORAL | Status: AC
  Administered 2016-04-02: 5 mg via ORAL
  Filled 2016-04-02: qty 1

## 2016-04-02 MED ORDER — ONDANSETRON HCL 4 MG/2ML IJ SOLN: 4.0000 mg | Freq: Four times a day (QID) | INTRAMUSCULAR | Status: DC | PRN

## 2016-04-02 MED ORDER — CYCLOBENZAPRINE HCL 10 MG PO TABS
10.0000 mg | ORAL_TABLET | Freq: Three times a day (TID) | ORAL | Status: DC | PRN
  Administered 2016-04-02: 10 mg via ORAL
  Filled 2016-04-02: qty 1

## 2016-04-02 MED ORDER — ENOXAPARIN SODIUM 80 MG/0.8ML ~~LOC~~ SOLN: 0.5000 mg/kg | SUBCUTANEOUS | Status: DC

## 2016-04-02 MED ORDER — ENOXAPARIN SODIUM 40 MG/0.4ML ~~LOC~~ SOLN
40.0000 mg | SUBCUTANEOUS | Status: AC
  Administered 2016-04-02: 40 mg via SUBCUTANEOUS
  Filled 2016-04-02: qty 0.4

## 2016-04-02 MED ORDER — OXYCODONE-ACETAMINOPHEN 5-325 MG PO TABS
1.0000 | ORAL_TABLET | Freq: Once | ORAL | Status: AC
  Administered 2016-04-02: 1 via ORAL

## 2016-04-02 MED ORDER — ALBUTEROL SULFATE HFA 108 (90 BASE) MCG/ACT IN AERS: 2.0000 | INHALATION_SPRAY | Freq: Four times a day (QID) | RESPIRATORY_TRACT | Status: DC | PRN

## 2016-04-02 MED ORDER — HYDROXYZINE PAMOATE 50 MG PO CAPS
50.0000 mg | ORAL_CAPSULE | Freq: Three times a day (TID) | ORAL | Status: DC | PRN
  Filled 2016-04-02: qty 1

## 2016-04-02 MED ORDER — SENNOSIDES-DOCUSATE SODIUM 8.6-50 MG PO TABS
1.0000 | ORAL_TABLET | Freq: Every evening | ORAL | Status: DC | PRN
  Administered 2016-04-02: 1 via ORAL
  Filled 2016-04-02 (×2): qty 1

## 2016-04-02 MED ORDER — TRAZODONE HCL 50 MG PO TABS
25.0000 mg | ORAL_TABLET | Freq: Every evening | ORAL | Status: DC | PRN
  Administered 2016-04-02: 50 mg via ORAL
  Filled 2016-04-02: qty 1

## 2016-04-02 MED ORDER — CLONAZEPAM 0.5 MG PO TABS: 0.5000 mg | ORAL_TABLET | Freq: Every day | ORAL | Status: DC | PRN

## 2016-04-02 MED ORDER — OXYCODONE-ACETAMINOPHEN 5-325 MG PO TABS
ORAL_TABLET | ORAL | Status: AC
  Filled 2016-04-02: qty 1

## 2016-04-02 MED ORDER — HYDROCODONE-ACETAMINOPHEN 5-325 MG PO TABS
1.0000 | ORAL_TABLET | ORAL | Status: DC | PRN
  Administered 2016-04-02 – 2016-04-03 (×3): 2 via ORAL
  Filled 2016-04-02 (×3): qty 2

## 2016-04-02 MED ORDER — ASPIRIN 325 MG PO TABS
325.0000 mg | ORAL_TABLET | Freq: Every day | ORAL | Status: DC
  Administered 2016-04-02 – 2016-04-03 (×2): 325 mg via ORAL
  Filled 2016-04-02 (×2): qty 1

## 2016-04-02 MED ORDER — INSULIN ASPART 100 UNIT/ML ~~LOC~~ SOLN
0.0000 [IU] | Freq: Three times a day (TID) | SUBCUTANEOUS | Status: DC
  Administered 2016-04-02: 2 [IU] via SUBCUTANEOUS
  Administered 2016-04-03: 3 [IU] via SUBCUTANEOUS
  Filled 2016-04-02: qty 1

## 2016-04-02 MED ORDER — ENOXAPARIN SODIUM 40 MG/0.4ML ~~LOC~~ SOLN
40.0000 mg | SUBCUTANEOUS | Status: DC
  Administered 2016-04-02: 40 mg via SUBCUTANEOUS
  Filled 2016-04-02: qty 0.4

## 2016-04-02 MED ORDER — LORAZEPAM 2 MG/ML IJ SOLN
2.0000 mg | Freq: Once | INTRAMUSCULAR | Status: AC
  Administered 2016-04-02: 2 mg via INTRAVENOUS

## 2016-04-02 MED ORDER — LORAZEPAM 2 MG/ML IJ SOLN
INTRAMUSCULAR | Status: AC
  Filled 2016-04-02: qty 1

## 2016-04-02 MED ORDER — NIFEDIPINE ER OSMOTIC RELEASE 90 MG PO TB24
90.0000 mg | ORAL_TABLET | Freq: Every day | ORAL | Status: DC
  Administered 2016-04-02 – 2016-04-03 (×2): 90 mg via ORAL
  Filled 2016-04-02 (×2): qty 1

## 2016-04-02 MED ORDER — ATENOLOL 25 MG PO TABS
25.0000 mg | ORAL_TABLET | Freq: Every day | ORAL | Status: DC
  Administered 2016-04-02 – 2016-04-03 (×2): 25 mg via ORAL
  Filled 2016-04-02 (×2): qty 1

## 2016-04-02 MED ORDER — GABAPENTIN 300 MG PO CAPS
300.0000 mg | ORAL_CAPSULE | Freq: Two times a day (BID) | ORAL | Status: DC
  Administered 2016-04-02 – 2016-04-03 (×3): 300 mg via ORAL
  Filled 2016-04-02 (×3): qty 1

## 2016-04-02 MED ORDER — BISACODYL 10 MG RE SUPP: 10.0000 mg | Freq: Every day | RECTAL | Status: DC | PRN

## 2016-04-02 MED ORDER — ACETAMINOPHEN 325 MG PO TABS: 650.0000 mg | ORAL_TABLET | Freq: Four times a day (QID) | ORAL | Status: DC | PRN

## 2016-04-02 MED ORDER — ALLOPURINOL 300 MG PO TABS
300.0000 mg | ORAL_TABLET | Freq: Every day | ORAL | Status: DC
  Administered 2016-04-02 – 2016-04-03 (×2): 300 mg via ORAL
  Filled 2016-04-02: qty 1
  Filled 2016-04-02: qty 3

## 2016-04-02 MED ORDER — OXYCODONE-ACETAMINOPHEN 5-325 MG PO TABS
1.0000 | ORAL_TABLET | Freq: Once | ORAL | Status: AC
  Administered 2016-04-02: 1 via ORAL
  Filled 2016-04-02: qty 1

## 2016-04-02 MED ORDER — ONDANSETRON HCL 4 MG PO TABS: 4.0000 mg | ORAL_TABLET | Freq: Four times a day (QID) | ORAL | Status: DC | PRN

## 2016-04-02 NOTE — ED Triage Notes (Signed)
Pt. reports left lower leg pain onset today , denies injury , currently taking oral antibiotic for her leg cellulitis.

## 2016-04-02 NOTE — Assessment & Plan Note (Signed)
With pain

## 2016-04-02 NOTE — ED Provider Notes (Signed)
[facility]-EMERGENCY DEPT Provider Note   CSN: [id number] Arrival date & time: 04/02/16  0100  History   Chief Complaint Chief Complaint  Patient presents with  . Leg Pain    HPI [name] is a 65 y.o. female.  HPI  Patient to ER with PMH of anxiety, arthritis, asthma, chronic low back pain, polyps, depression, HA, gout, hypertension, migraines, osteoarthritis, hyperlipidemia,  Diabetes, asthma, wheel chair dependent.  She is here because she she is having left lower leg pain that started on 04/01/16, she has not had any injury that she can remember that caused it. She is currently on Doxycyline after admission for LE infection 10/9-10/12.  Patient has been wearing a silver infused sock that has significantly helped her wound to her left lower leg. She says that the wound looks a lot better, the swelling has gone down and it is no longer weeping. One week ago after a wound care visit she developed pain, as the time has passed her pain has gotten worse to where it hurts so severely know she says that she cannot lift up her leg. She lives with her husband and son and is required to be able to transfer herself to the commode to chair as well as chair to wheel chair and bed to chair which she is now unable to do. Denies fevers, chills, weakness, numbness.    Past Medical History:  Diagnosis Date  . Anxiety   . Arthritis    "back, arms, legs" (03/24/2016)  . Asthma   . Chronic lower back pain   . Colonic polyp    last colonoscopy done in 2009 with normal results per medical record  . Depressive disorder   . Gastric polyp   . Gout    has taken allopurinol 300mg  once daily in past  . Headache   . History of hiatal hernia   . Hypertension   . Migraine    "none in awhile; might have a couple/year" (03/24/2016)  . Mixed hyperlipidemia    01/2016 Total chol 141, HDL 59, LDL 63, ration 1.1  . Osteoarthritis   . TIA (transient ischemic attack) 11/2014  . Type II diabetes mellitus [facility])      Patient Active Problem List   Diagnosis Date Noted  . Left leg weakness 04/02/2016  . Personal history of noncompliance with medical treatment, presenting hazards to health 03/31/2016  . Jaw pain 03/27/2016  . Left leg cellulitis 03/24/2016  . Cellulitis of left lower extremity   . Diabetes mellitus with complication (HCC)   . Primary insomnia   . Adjustment disorder   . Morbid obesity (HCC) 01/27/2016  . Wheelchair dependent 01/27/2016  . Gout 01/27/2016  . Asthma 01/27/2016  . Arthritis 01/27/2016  . Uncontrolled diabetes mellitus type 2 with peripheral artery disease (HCC) 01/25/2016  . Cellulitis of left leg 01/11/2016  . DM (diabetes mellitus) (HCC) 01/11/2016  . Hypertension 01/11/2016    Past Surgical History:  Procedure Laterality Date  . BREAST BIOPSY Left ~ 2015   benign  . CARPAL TUNNEL RELEASE Bilateral   . DILATION AND CURETTAGE OF UTERUS    . KNEE ARTHROSCOPY Right 2003   in [place]  . LAPAROSCOPIC CHOLECYSTECTOMY    . TUBAL LIGATION    . VAGINAL HYSTERECTOMY  1982    OB History    No data available       Home Medications    Prior to Admission medications   Medication Sig Start Date End Date Taking? Authorizing Provider  albuterol (PROVENTIL HFA;VENTOLIN HFA) 108 (90 Base) MCG/ACT inhaler Inhale 2 puffs into the lungs every 6 (six) hours as needed for wheezing or shortness of breath.   Yes Historical Provider, MD  allopurinol (ZYLOPRIM) 300 MG tablet Take 300 mg by mouth daily.   Yes Historical Provider, MD  aspirin 325 MG tablet Take 325 mg by mouth daily.   Yes Historical Provider, MD  atenolol (TENORMIN) 25 MG tablet Take 25-50 mg by mouth daily. Take 50 mg every morning  Take 25 mg every evening   Yes Historical Provider, MD  clonazePAM (KLONOPIN) 0.5 MG tablet Take 1 tablet (0.5 mg total) by mouth daily as needed for anxiety. 02/25/16  Yes Avanell ShackletonVickie L Henson, NP  cyclobenzaprine (FLEXERIL) 10 MG tablet Take 10 mg by mouth 3 (three) times daily as  needed for muscle spasms.   Yes Historical Provider, MD  diclofenac sodium (VOLTAREN) 1 % GEL Apply 4 g topically 4 (four) times daily. 02/25/16  Yes Avanell ShackletonVickie L Henson, NP  gabapentin (NEURONTIN) 300 MG capsule Take 1 capsule (300 mg total) by mouth 2 (two) times daily. 01/31/16  Yes Avanell ShackletonVickie L Henson, NP  hydrocerin (EUCERIN) CREA Apply 1 application topically daily. 01/15/16  Yes Leroy SeaPrashant K Singh, MD  hydrOXYzine (VISTARIL) 50 MG capsule Take 50 mg by mouth 3 (three) times daily as needed for itching.    Yes Historical Provider, MD  insulin degludec (TRESIBA FLEXTOUCH) 100 UNIT/ML SOPN FlexTouch Pen Inject 0.75 mLs (75 Units total) into the skin daily. 03/14/16  Yes Avanell ShackletonVickie L Henson, NP  NIFEdipine (PROCARDIA XL/ADALAT-CC) 90 MG 24 hr tablet Take 90 mg by mouth daily.   Yes Historical Provider, MD    Family History No family history on file.  Social History Social History  Substance Use Topics  . Smoking status: Never Smoker  . Smokeless tobacco: Never Used  . Alcohol use No     Allergies   Ace inhibitors and Metformin and related   Review of Systems Review of Systems  Review of Systems All other systems negative except as documented in the HPI. All pertinent positives and negatives as reviewed in the HPI.  Physical Exam Updated Vital Signs BP 119/69   Pulse 78   Temp 98.5 F (36.9 C) (Oral)   Ht 5\' 5"  (1.651 m)   Wt (!) 161 kg   SpO2 98%   BMI 59.08 kg/m   Physical Exam  Constitutional: She appears well-developed and well-nourished. No distress.  HENT:  Head: Normocephalic and atraumatic.  Eyes: Pupils are equal, round, and reactive to light.  Neck: Normal range of motion. Neck supple.  Cardiovascular: Normal rate and regular rhythm.   Pulmonary/Chest: Effort normal.  Abdominal: Soft.  Musculoskeletal:  Chronic venous insufficiency of bilateral lower legs, left worse than right.  Swelling to the left foot with wound covered in gauze with very minimal drainage to it. Pt  does have tenderness that extends from the foot up to her knee. Because of pain she is unable to lift foot off of bed.  Neurological: She is alert.  Wheel chair bound  Skin: Skin is warm and dry.  Nursing note and vitals reviewed.    ED Treatments / Results  Labs (all labs ordered are listed, but only abnormal results are displayed) Labs Reviewed  COMPREHENSIVE METABOLIC PANEL - Abnormal; Notable for the following:       Result Value   Glucose, Bld 203 (*)    Creatinine, Ser 1.15 (*)    Albumin 3.2 (*)  GFR calc non Af Amer 48 (*)    GFR calc Af Amer 56 (*)    All other components within normal limits  CBC WITH DIFFERENTIAL/PLATELET    EKG  EKG Interpretation None       Radiology Dg Foot Complete Left  Result Date: 04/02/2016 CLINICAL DATA:  65 year old female with left foot pain.  Diabetes. EXAM: LEFT FOOT - COMPLETE 3+ VIEW COMPARISON:  None. FINDINGS: There is no acute fracture or dislocation. The bones are osteopenic. There is diffuse soft tissue and skin edema. No soft tissue gas or radiopaque foreign object. IMPRESSION: No acute fracture or dislocation. Diffuse soft tissue edema and skin thickening. Electronically Signed   By: Elgie Collard M.D.   On: 04/02/2016 06:08    Procedures Procedures (including critical care time)  Medications Ordered in ED Medications  oxyCODONE-acetaminophen (PERCOCET/ROXICET) 5-325 MG per tablet 1 tablet (1 tablet Oral Given 04/02/16 0122)  oxyCODONE-acetaminophen (PERCOCET/ROXICET) 5-325 MG per tablet 1 tablet (1 tablet Oral Given 04/02/16 0434)  cyclobenzaprine (FLEXERIL) tablet 5 mg (5 mg Oral Given 04/02/16 0435)     Initial Impression / Assessment and Plan / ED Course  I have reviewed the triage vital signs and the nursing notes.  Pertinent labs & imaging results that were available during my care of the patient were reviewed by me and considered in my medical decision making (see chart for details).  Clinical Course     Unclear to the etiology of the patients difficulty moving her leg- Dr. Bebe Shaggy and myself have tried to assess weakness versus pain but the patient will not move the leg and says it is weak and painful. We considered consulting Case Manager for nursing home placement but because true weakness and radiculopathy cannot be ruled out admission for weakness r/o. We do not feel like this is consistent for stroke.  Dr. Clyde Lundborg with [facility] has agreed for medsurg obs admission, [facility] hospital. Pt updated on plan for admission.  Final Clinical Impressions(s) / ED Diagnoses   Final diagnoses:  Left leg pain  Left leg weakness    New Prescriptions New Prescriptions   No medications on file     Darnelle Going 04/02/16 1610    Zadie Rhine, MD 04/02/16 [phone]

## 2016-04-02 NOTE — H&P (Signed)
History and Physical    [name]:[id number] DOB: Feb 17, 1951 DOA: 04/02/2016   PCP: Hetty Blend, NP   Patient coming from:  Home   Chief Complaint: Left leg cellulitis   HPI: [name] is a 65 y.o. female with medical history significant for DM, gout, temporal arteritis treated with prednisone, multiple hospitalizations for for LLE cellulitis most recently from 10/9-10/12, discharged on Doxycycline,  presenting with LLE pain beginning on 10/17, unable to move it due to the symptoms. She has been using a silver infused sock that has significantly helped her LLE wound, reporting that this is no longer weeping and that the swelling has decreased. She also reports that her Neurontin may not be therapeutic. Denies any falls or injuries to the area prior to admission, Denies fevers, chills, night sweats, vision changes, or mucositis. Denies any respiratory complaints. Denies any chest pain or palpitations.   Denies nausea, heartburn or change in bowel habits  Denies abdominal pain. Appetite is normal. Denies any dysuria. Denies other abnormal skin rashes Denies any bleeding issues such as epistaxis, hematemesis, hematuria or hematochezia.    ED Course:  BP 110/59 (BP Location: Right Arm)   Pulse 74   Temp 98.5 F (36.9 C) (Oral)   Resp 19   Ht 5\' 5"  (1.651 m)   Wt (!) 161 kg (355 lb)   SpO2 98%   BMI 59.08 kg/m   L foot X ray without acute fracture or dislocation; diffuse soft tissue and skin edema GLu 203 Cr 1.15  Received oxycodone and Flexeril at the ED CBC normal  Review of Systems: As per HPI otherwise 10 point review of systems negative.   Past Medical History:  Diagnosis Date  . Anxiety   . Arthritis    "back, arms, legs" (03/24/2016)  . Asthma   . Chronic lower back pain   . Colonic polyp    last colonoscopy done in 2009 with normal results per medical record  . Depressive disorder   . Gastric polyp   . Gout    has taken allopurinol 300mg  once daily in past    . Headache   . History of hiatal hernia   . Hypertension   . Migraine    "none in awhile; might have a couple/year" (03/24/2016)  . Mixed hyperlipidemia    01/2016 Total chol 141, HDL 59, LDL 63, ration 1.1  . Osteoarthritis   . TIA (transient ischemic attack) 11/2014  . Type II diabetes mellitus (HCC)     Past Surgical History:  Procedure Laterality Date  . BREAST BIOPSY Left ~ 2015   benign  . CARPAL TUNNEL RELEASE Bilateral   . DILATION AND CURETTAGE OF UTERUS    . KNEE ARTHROSCOPY Right 2003   in [place]  . LAPAROSCOPIC CHOLECYSTECTOMY    . TUBAL LIGATION    . VAGINAL HYSTERECTOMY  1982    Social History Social History   Social History  . Marital status: Married    Spouse name: N/A  . Number of children: N/A  . Years of education: N/A   Occupational History  . Not on file.   Social History Main Topics  . Smoking status: Never Smoker  . Smokeless tobacco: Never Used  . Alcohol use No  . Drug use: No  . Sexual activity: No   Other Topics Concern  . Not on file   Social History Narrative   Recently moved from [place]     Allergies  Allergen Reactions  . Ace  Inhibitors Swelling  . Metformin And Related Other (See Comments)    chills    Family History  Problem Relation Age of Onset  . Stroke Father   . Stroke Brother   . Cancer Other       Prior to Admission medications   Medication Sig Start Date End Date Taking? Authorizing Provider  ACCU-CHEK FASTCLIX LANCETS MISC Test 3 times a day. Patient uses accu-check meter 01/28/16   Avanell Shackleton, NP  albuterol (PROVENTIL HFA;VENTOLIN HFA) 108 (90 Base) MCG/ACT inhaler Inhale 2 puffs into the lungs every 6 (six) hours as needed for wheezing or shortness of breath.    Historical Provider, MD  allopurinol (ZYLOPRIM) 300 MG tablet Take 300 mg by mouth daily.    Historical Provider, MD  aspirin 325 MG tablet Take 325 mg by mouth daily.    Historical Provider, MD  atenolol (TENORMIN) 25 MG tablet Take  25-50 mg by mouth daily. Take 50 mg every morning  Take 25 mg every evening    Historical Provider, MD  clonazePAM (KLONOPIN) 0.5 MG tablet Take 1 tablet (0.5 mg total) by mouth daily as needed for anxiety. 02/25/16   Avanell Shackleton, NP  cyclobenzaprine (FLEXERIL) 10 MG tablet Take 10 mg by mouth 3 (three) times daily as needed for muscle spasms.    Historical Provider, MD  diclofenac sodium (VOLTAREN) 1 % GEL Apply 4 g topically 4 (four) times daily. 02/25/16   Avanell Shackleton, NP  gabapentin (NEURONTIN) 300 MG capsule Take 1 capsule (300 mg total) by mouth 2 (two) times daily. 01/31/16   Avanell Shackleton, NP  glucose blood (ACCU-CHEK AVIVA) test strip Test 3 times a day. Pt uses accu-chek aviva meter 01/28/16   Avanell Shackleton, NP  hydrocerin (EUCERIN) CREA Apply 1 application topically daily. 01/15/16   Leroy Sea, MD  hydrOXYzine (VISTARIL) 50 MG capsule Take 50 mg by mouth 3 (three) times daily as needed.    Historical Provider, MD  insulin degludec (TRESIBA FLEXTOUCH) 100 UNIT/ML SOPN FlexTouch Pen Inject 0.75 mLs (75 Units total) into the skin daily. 03/14/16   Avanell Shackleton, NP  insulin glulisine (APIDRA) 100 UNIT/ML injection Inject 0.1-0.15 mLs (10-15 Units total) into the skin 2 (two) times daily. <160 take 10 units >161 take 15 units 03/14/16   Avanell Shackleton, NP  magnesium gluconate (MAGONATE) 500 MG tablet Take 500 mg by mouth 2 (two) times daily.    Historical Provider, MD  NIFEdipine (PROCARDIA XL/ADALAT-CC) 90 MG 24 hr tablet Take 90 mg by mouth daily.    Historical Provider, MD    Physical Exam:    Vitals:   04/02/16 0515 04/02/16 0530 04/02/16 0715 04/02/16 0716  BP: 118/65 119/69 110/59 110/59  Pulse: 83 78 75 74  Resp:   18 19  Temp:      TempSrc:      SpO2: 98% 98% 98% 98%  Weight:      Height:           Constitutional: NAD, calm, comfortable  Vitals:   04/02/16 0515 04/02/16 0530 04/02/16 0715 04/02/16 0716  BP: 118/65 119/69 110/59 110/59  Pulse: 83 78  75 74  Resp:   18 19  Temp:      TempSrc:      SpO2: 98% 98% 98% 98%  Weight:      Height:       Eyes: PERRL, lids and conjunctivae normal ENMT: Mucous membranes are moist. Posterior pharynx  clear of any exudate or lesions.Normal dentition.  Neck: normal, supple, no masses, no thyromegaly Respiratory: clear to auscultation bilaterally, no wheezing, no crackles. Normal respiratory effort. No accessory muscle use.  Cardiovascular: Regular rate and rhythm, no murmurs / rubs / gallops. No extremity edema. 2+ pedal pulses. Feet are warm to touch  No carotid bruits.  Abdomen:  Morbidly obese no tenderness, no masses palpated. No hepatosplenomegaly. Bowel sounds positive.  Musculoskeletal: no clubbing / cyanosis. No joint deformity upper and lower extremities. Good ROM, no contractures. Normal muscle tone.  Skin: remarkable for chronic bilateral  dry scaling/eschar, swelling left foot covered with gauze with very minimal drainage, non odorous; tenderness in the LLE up to the knee.  Neurologic: CN 2-12 grossly intact. Sensation intact, DTR normal. Strength 5/5 in all 3. Patient not cooperative to move the LLE due to pain  Psychiatric: Normal judgment and insight. Alert and oriented x 3. Normal mood.     Labs on Admission: I have personally reviewed following labs and imaging studies  CBC:  Recent Labs Lab 04/02/16 0129  WBC 7.9  NEUTROABS 5.7  HGB 13.0  HCT 39.3  MCV 89.9  PLT 291    Basic Metabolic Panel:  Recent Labs Lab 04/02/16 0129  NA 137  K 3.5  CL 103  CO2 22  GLUCOSE 203*  BUN 10  CREATININE 1.15*  CALCIUM 9.2    GFR: Estimated Creatinine Clearance: 74.9 mL/min (by C-G formula based on SCr of 1.15 mg/dL (H)).  Liver Function Tests:  Recent Labs Lab 04/02/16 0129  AST 17  ALT 14  ALKPHOS 102  BILITOT 0.4  PROT 7.9  ALBUMIN 3.2*   No results for input(s): LIPASE, AMYLASE in the last 168 hours. No results for input(s): AMMONIA in the last 168  hours.  Coagulation Profile: No results for input(s): INR, PROTIME in the last 168 hours.  Cardiac Enzymes: No results for input(s): CKTOTAL, CKMB, CKMBINDEX, TROPONINI in the last 168 hours.  BNP (last 3 results) No results for input(s): PROBNP in the last 8760 hours.  HbA1C: No results for input(s): HGBA1C in the last 72 hours.  CBG: No results for input(s): GLUCAP in the last 168 hours.  Lipid Profile: No results for input(s): CHOL, HDL, LDLCALC, TRIG, CHOLHDL, LDLDIRECT in the last 72 hours.  Thyroid Function Tests: No results for input(s): TSH, T4TOTAL, FREET4, T3FREE, THYROIDAB in the last 72 hours.  Anemia Panel: No results for input(s): VITAMINB12, FOLATE, FERRITIN, TIBC, IRON, RETICCTPCT in the last 72 hours.  Urine analysis:    Component Value Date/Time   COLORURINE YELLOW 03/24/2016 0857   APPEARANCEUR CLEAR 03/24/2016 0857   LABSPEC 1.015 03/24/2016 0857   PHURINE 7.5 03/24/2016 0857   GLUCOSEU NEGATIVE 03/24/2016 0857   HGBUR NEGATIVE 03/24/2016 0857   BILIRUBINUR NEGATIVE 03/24/2016 0857   KETONESUR NEGATIVE 03/24/2016 0857   PROTEINUR NEGATIVE 03/24/2016 0857   NITRITE NEGATIVE 03/24/2016 0857   LEUKOCYTESUR NEGATIVE 03/24/2016 0857    Sepsis Labs: @LABRCNTIP (procalcitonin:4,lacticidven:4) ) Recent Results (from the past 240 hour(s))  Urine culture     Status: Abnormal   Collection Time: 03/24/16  8:57 AM  Result Value Ref Range Status   Specimen Description URINE, CLEAN CATCH  Final   Special Requests NONE  Final   Culture MULTIPLE SPECIES PRESENT, SUGGEST RECOLLECTION (A)  Final   Report Status 03/25/2016 FINAL  Final  Culture, blood (Routine x 2)     Status: None   Collection Time: 03/24/16  9:50 AM  Result Value Ref Range Status   Specimen Description BLOOD LEFT ANTECUBITAL  Final   Special Requests BOTTLES DRAWN AEROBIC AND ANAEROBIC  10CC  Final   Culture NO GROWTH 5 DAYS  Final   Report Status 03/29/2016 FINAL  Final  Wound or  Superficial Culture     Status: None   Collection Time: 03/24/16 12:10 PM  Result Value Ref Range Status   Specimen Description WOUND LEFT ANKLE  Final   Special Requests Normal  Final   Gram Stain   Final    RARE WBC PRESENT, PREDOMINANTLY PMN ABUNDANT GRAM NEGATIVE RODS ABUNDANT GRAM POSITIVE COCCI IN CLUSTERS MODERATE GRAM POSITIVE RODS    Culture   Final    ABUNDANT ESCHERICHIA COLI ABUNDANT DIPHTHEROIDS(CORYNEBACTERIUM SPECIES) Standardized susceptibility testing for this organism is not available.    Report Status 03/26/2016 FINAL  Final   Organism ID, Bacteria ESCHERICHIA COLI  Final      Susceptibility   Escherichia coli - MIC*    AMPICILLIN <=2 SENSITIVE Sensitive     CEFAZOLIN <=4 SENSITIVE Sensitive     CEFEPIME <=1 SENSITIVE Sensitive     CEFTAZIDIME <=1 SENSITIVE Sensitive     CEFTRIAXONE <=1 SENSITIVE Sensitive     CIPROFLOXACIN <=0.25 SENSITIVE Sensitive     GENTAMICIN <=1 SENSITIVE Sensitive     IMIPENEM <=0.25 SENSITIVE Sensitive     TRIMETH/SULFA <=20 SENSITIVE Sensitive     AMPICILLIN/SULBACTAM <=2 SENSITIVE Sensitive     PIP/TAZO <=4 SENSITIVE Sensitive     Extended ESBL NEGATIVE Sensitive     * ABUNDANT ESCHERICHIA COLI  Culture, blood (Routine x 2)     Status: None   Collection Time: 03/24/16 12:20 PM  Result Value Ref Range Status   Specimen Description BLOOD LEFT ANTECUBITAL  Final   Special Requests BOTTLES DRAWN AEROBIC ONLY 10CC  Final   Culture NO GROWTH 5 DAYS  Final   Report Status 03/29/2016 FINAL  Final     Radiological Exams on Admission: Dg Foot Complete Left  Result Date: 04/02/2016 CLINICAL DATA:  65 year old female with left foot pain.  Diabetes. EXAM: LEFT FOOT - COMPLETE 3+ VIEW COMPARISON:  None. FINDINGS: There is no acute fracture or dislocation. The bones are osteopenic. There is diffuse soft tissue and skin edema. No soft tissue gas or radiopaque foreign object. IMPRESSION: No acute fracture or dislocation. Diffuse soft tissue  edema and skin thickening. Electronically Signed   By: Elgie CollardArash  Radparvar M.D.   On: 04/02/2016 06:08    EKG: Independently reviewed.  Assessment/Plan Active Problems:   Cellulitis of left leg   Left Lower extremity pain    DM (diabetes mellitus) (HCC)   Hypertension   Morbid obesity (HCC)   Wheelchair dependent   Gout   Primary insomnia    Left foot pain in a patient with history of  recurrent Left Lower Extremity Cellulitis, recently discharged on 10/12 with Doxyxycline. Unlikely infectious, rule out radiculopathy/neuropathy/vascular VSS, afebrile, WBC normal . Non toxic appearing . Admit to med surg    MRI L spine to rule out spinal cord compression Vascular studies to rule out arterial and/or venous disease Wound care consult  PT/OT  Pain control Case manager for Home Health nursing   Type II Diabetes with neuropathy  Current blood sugar level is 203 Lab Results  Component Value Date   HGBA1C 9.7 (H) 03/24/2016   Hold home oral diabetic medications.   SSI Heart healthy carb modified diet. Continue neurontin. To consider increasing dose for  comfort after vascular and MRI results available    Hypertension BP 110/59 Pulse 74  Controlled Continue home anti-hypertensive medications    History of asthma without exacerbation  Continue inhalers as needed   Anxiety Continue meds with Klonopin    DVT prophylaxis: Lovenox Code Status:   Full   Family Communication:  Discussed with patient wife husband  Disposition Plan: Expect patient to be discharged to home after condition improves Consults called:   Wound care  Admission status:  Medsurg     Cedra Villalon E, PA-C [facility]   If 7PM-7AM, please contact night-coverage [email or web address] Password [email or web address]  04/02/2016, 8:53 AM

## 2016-04-02 NOTE — ED Notes (Signed)
Nurse was informed of patient CBG OF 161.

## 2016-04-02 NOTE — Progress Notes (Signed)
This is a no charge note  Pending admission per PA, [name]  65 year old lady with past medical history of morbid obesity, chronic back pain, TIA, hypertension, hyperlipidemia, diabetes mellitus, gout, anxiety, who presents with left lower leg pain. Patient has been treated for left lower leg cellulitis and wound with some improvement. Currently she is taking doxycycline, but the patient has worsening left leg pain. Cannot transfer herself due to morbid obesity and pain. Per ED physician, she seems to have weakness in left leg, suspected to have possible radiculopathy. WBC 7.9, temperature normal, x-ray of left foot is negative for bony abnormalities. Pt is accepted to med-surg bed for obs. Pt may need SNF placement.   Lorretta HarpXilin Zacharia Sowles, MD  [facility] Pager [phone]  If 7PM-7AM, please contact night-coverage [email or web address] Password [email or web address] 04/02/2016, 6:29 AM

## 2016-04-02 NOTE — ED Notes (Signed)
Placed order for breakfast tray.

## 2016-04-02 NOTE — ED Notes (Signed)
To MRI for IV start and meds.

## 2016-04-02 NOTE — Progress Notes (Signed)
   Dopplers obtained in ED by nursing staff positive for left posterior tibial and dorsalis pedis pulses. With these results in conjunction with recent ABI and venous duplex being essentially normal we will forego further testing at this time. If for some reason patient's symptoms do not improve consider reordering studies.  Shelly Flattenavid Porsche Noguchi, MD [facility] 04/02/2016, 12:25 PM

## 2016-04-02 NOTE — ED Notes (Signed)
Pt to radiology.

## 2016-04-02 NOTE — ED Notes (Signed)
Pt return to room from MRI

## 2016-04-02 NOTE — Consult Note (Addendum)
WOC Nurse wound consult note Reason for Consult:Compression wrap to LLE Wound type:Full thickness Venous Stasis/ LLE Pressure Ulcer POA: Yes Measurement:5x5 patchy area  Wound ZOX:WRUEbed:Pink Drainage (amount, consistency, odor) Scant  Periwound:Dry, cracked, raised skin r/t Venous Stasis  Dressing procedure/placement/frequency:Continue current plan of care with Sliver Alginate to wound,secured with Kerlix and Coban to LLE. Patient should follow-up on Monday with [facility].  Amparo BristolJennifer Clay WOC student Please re-consult if further assistance is needed.  Thank-you,  Cammie Mcgeeawn Nahomi Hegner MSN, RN, CWOCN, [place] EstatesWCN-AP, CNS [phone]

## 2016-04-02 NOTE — Progress Notes (Signed)
Family history reviewed, significant for stroke and cancer and updated in chart.

## 2016-04-03 DIAGNOSIS — E1142 Type 2 diabetes mellitus with diabetic polyneuropathy: Secondary | ICD-10-CM

## 2016-04-03 DIAGNOSIS — M79605 Pain in left leg: Secondary | ICD-10-CM

## 2016-04-03 LAB — GLUCOSE, CAPILLARY
GLUCOSE-CAPILLARY: 233 mg/dL — AB (ref 65–99)
GLUCOSE-CAPILLARY: 218 mg/dL — AB (ref 65–99)

## 2016-04-03 LAB — COMPREHENSIVE METABOLIC PANEL
ALBUMIN: 3 g/dL — AB (ref 3.5–5.0)
ALK PHOS: 98 U/L (ref 38–126)
ALT: 14 U/L (ref 14–54)
AST: 15 U/L (ref 15–41)
Anion gap: 8 (ref 5–15)
BUN: 6 mg/dL (ref 6–20)
CALCIUM: 9.1 mg/dL (ref 8.9–10.3)
CHLORIDE: 105 mmol/L (ref 101–111)
CO2: 24 mmol/L (ref 22–32)
CREATININE: 0.67 mg/dL (ref 0.44–1.00)
GFR calc Af Amer: >60 mL/min (ref >60)
GFR calc non Af Amer: >60 mL/min (ref >60)
GLUCOSE: 186 mg/dL — AB (ref 65–99)
Potassium: 3.7 mmol/L (ref 3.5–5.1)
SODIUM: 137 mmol/L (ref 135–145)
Total Bilirubin: 0.4 mg/dL (ref 0.3–1.2)
Total Protein: 7.5 g/dL (ref 6.5–8.1)

## 2016-04-03 LAB — CBC
HCT: 37.9 % (ref 36.0–46.0)
Hemoglobin: 12.4 g/dL (ref 12.0–15.0)
MCH: 29.2 pg (ref 26.0–34.0)
MCHC: 32.7 g/dL (ref 30.0–36.0)
MCV: 89.2 fL (ref 78.0–100.0)
PLATELETS: 311 10*3/uL (ref 150–400)
RBC: 4.25 MIL/uL (ref 3.87–5.11)
RDW: 15 % (ref 11.5–15.5)
WBC: 5.7 10*3/uL (ref 4.0–10.5)

## 2016-04-03 NOTE — Progress Notes (Signed)
Feels much better No longer having pain in her left leg She is now able to move her left leg and bear weight on the leg. She is very minimally mobile (at baseline)-on only takes a few steps from a wheelchair On exam-her left lower extremity is warm to touch. Recent Dopplers were negative. She does have known diabetic neuropathy-I am not certain what caused her to have pain in her left lower legs that have subsequently resolved with just supportive care. She is now requesting discharge which I think is reasonable, holding off further workup at this time as the pain seems to have resolved on its own.

## 2016-04-03 NOTE — Discharge Summary (Signed)
PATIENT DETAILS Name: [name] Age: 65 y.o. Sex: female Date of Birth: Feb 22, 1951 MRN: [id number]. Admitting Physician: Ozella Rocks, MD [id number] Suezanne Jacquet, NP  Admit Date: 04/02/2016 Discharge date: 04/03/2016  Recommendations for Outpatient Follow-up:  1. Follow up with PCP in 1-2 weeks 2. Please obtain BMP/CBC in one week  Admitted From:  Home  Disposition: Home with home health services    Home Health:  Yes  Equipment/Devices: None  Discharge Condition: Stable  CODE STATUS: FULL CODE  Diet recommendation:  Heart Healthy / Carb Modified / Regular / Dysphagia 1/2/3 with full aspiration precautions  Brief Summary: See H&P, Labs, Consult and Test reports for all details in brief, patient 65 year old morbidly obese female with history of diabetes, gout, chronic left lower extremity ulceration who presented to the hospital with acute left leg pain.  Brief Hospital Course: Left lower extremity pain: Claims to have had severe pain since this past Monday starting from her left lower leg and going to her knees. Denies any back pain. Claims that yesterday she was unable to move her left leg due to pain and was unable to bear weight on the leg. She was subsequently admitted and provided supportive care, a MRI of the lower back did not show any acute abnormalities (it was incomplete study due to claustrophobia). This morning, she claims that her pain has essentially resolved, she is now moving the left leg, and is able to bear weight on the left leg as well. Please note baseline she is very minimally ambulatory and is only able to take a few steps and is mostly wheelchair bound. Since her pain has essentially resolved, and patient is back to her usual baseline she is being discharged home. A left lower extremity Doppler was negative for DVT on 10/9-and it was not repeated. Her recent ABI on 7/30 was also essentially normal-her left lower extremity is warm to touch, pulses  were able to be dopplered by the nursing staff on admission. 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 She is to continue follow-up with outpatient wound center.  Rest of her medical issues were stable during this short hospitalization.  Procedures/Studies: None  Discharge Diagnoses:  Active Problems:   Cellulitis of left leg   DM (diabetes mellitus) (HCC)   Hypertension   Morbid obesity (HCC)   Wheelchair dependent   Gout   Primary insomnia   Left Lower extremity pain    Lower extremity pain, inferior, left   Discharge Instructions:  Activity:  As tolerated with Full fall precautions use walker/cane & assistance as needed   Discharge Instructions    Call MD for:  persistant nausea and vomiting    Complete by:  As directed    Call MD for:  redness, tenderness, or signs of infection (pain, swelling, redness, odor or green/yellow discharge around incision site)    Complete by:  As directed    Diet - low sodium heart healthy    Complete by:  As directed    Diet Carb Modified    Complete by:  As directed    Increase activity slowly    Complete by:  As directed        Medication List    TAKE these medications   albuterol 108 (90 Base)  MCG/ACT inhaler Commonly known as:  PROVENTIL HFA;VENTOLIN HFA Inhale 2 puffs into the lungs every 6 (six) hours as needed for wheezing or shortness of breath.   allopurinol 300 MG tablet Commonly known as:  ZYLOPRIM Take 300 mg by mouth daily.   aspirin 325 MG tablet Take 325 mg by mouth daily.   atenolol 25 MG tablet Commonly known as:  TENORMIN Take 25-50 mg by mouth daily. Take 50 mg every morning  Take 25 mg every evening   clonazePAM 0.5 MG tablet Commonly known as:  KLONOPIN Take 1  tablet (0.5 mg total) by mouth daily as needed for anxiety.   cyclobenzaprine 10 MG tablet Commonly known as:  FLEXERIL Take 10 mg by mouth 3 (three) times daily as needed for muscle spasms.   diclofenac sodium 1 % Gel Commonly known as:  VOLTAREN Apply 4 g topically 4 (four) times daily.   gabapentin 300 MG capsule Commonly known as:  NEURONTIN Take 1 capsule (300 mg total) by mouth 2 (two) times daily.   hydrocerin Crea Apply 1 application topically daily.   hydrOXYzine 50 MG capsule Commonly known as:  VISTARIL Take 50 mg by mouth 3 (three) times daily as needed for itching.   insulin degludec 100 UNIT/ML Sopn FlexTouch Pen Commonly known as:  TRESIBA FLEXTOUCH Inject 0.75 mLs (75 Units total) into the skin daily.   NIFEdipine 90 MG 24 hr tablet Commonly known as:  PROCARDIA XL/ADALAT-CC Take 90 mg by mouth daily.      Follow-up Information    Hetty Blend, NP. Schedule an appointment as soon as possible for a visit in 1 week(s).   Specialty:  Family Medicine Contact information: [address] [place] [address] [phone]          Allergies  Allergen Reactions  . Ace Inhibitors Swelling  . Metformin And Related Other (See Comments)    chills    Consultations:   None   Other Procedures/Studies: Dg Tibia/fibula Left  Result Date: 03/24/2016 CLINICAL DATA:  Left lower extremity cellulitis.  Pain. EXAM: LEFT TIBIA AND FIBULA - 2 VIEW COMPARISON:  None. FINDINGS: The left tibia and fibula are intact. No evidence for fracture. Soft tissue irregularity along the medial lower leg. There appears to be mild degenerative changes along medial aspect of the left knee. Evidence for calcaneal spurring. IMPRESSION: No acute bone abnormality. Electronically Signed   By: Richarda Overlie M.D.   On: 03/24/2016 14:50   Mr Lumbar Spine Wo Contrast  Result Date: 04/02/2016 CLINICAL DATA:  65 year old female with morbid obesity and chronic back pain. Presents with  left lower extremity pain. Left lower extremity cellulitis and wound being treated recently with some improvement. However, leg pain is worsening. Possible left leg weakness/radiculopathy. Initial encounter. The examination had to be discontinued prior to completion due to patient refusal to continue. EXAM: MRI LUMBAR SPINE WITHOUT CONTRAST TECHNIQUE: Multiplanar, multisequence MR imaging of the lumbar spine was performed. No intravenous contrast was administered. COMPARISON:  None. FINDINGS: Sagittal T2 and STIR imaging only was obtained. Segmentation: Lumbar segmentation appears to be normal and will be designated as such for this report. Alignment: Suggestion of mild lumbar scoliosis. Trace anterolisthesis of L4 on L5, and L3 on L4. Vertebrae: Lower lumbar degenerative endplate changes. No marrow edema identified in the lower thoracic or lumbar spine. However, sagittal STIR series 4 image 11 suggests left sacral ala marrow edema. Conus medullaris: Extends to the L1-L2 level and appears normal. Paraspinal and other soft tissues:  Partially visible it distension of the urinary bladder. Visualized abdominal viscera and paraspinal soft tissues are within normal limits. Disc levels: Multilevel lumbar disc bulging. More advanced lumbar disc and endplate degeneration at L5-S1. Moderate to severe lumbar facet hypertrophy from L3-L4 to L5-S1 with some mostly right side facet joint fluid. Up to mild lumbar spinal stenosis results. Based on the provided sagittal images there is no high-grade lumbar spinal stenosis. IMPRESSION: 1. The examination had to be discontinued prior to completion. No axial imaging was obtained. 2. Evidence of marrow edema in the left sacral ala. This is nonspecific but could be seen with sacral fracture (including insufficiency fracture) and osteomyelitis. Pelvis or SI joint MRI would be ideal for further evaluation. Failing that, Pelvis CT might be most valuable. 3. Moderate to severe lower lumbar  facet arthropathy. Advanced disc and endplate degeneration at L5-S1. Only mild lumbar spinal stenosis is suspected. Electronically Signed   By: Odessa Fleming M.D.   On: 04/02/2016 11:54   Ct Maxillofacial W Contrast  Result Date: 03/07/2016 CLINICAL DATA:  Initial evaluation for acute right-sided facial pain. EXAM: CT MAXILLOFACIAL WITH CONTRAST TECHNIQUE: Multidetector CT imaging of the maxillofacial structures was performed with intravenous contrast. Multiplanar CT image reconstructions were also generated. A small metallic BB was placed on the right temple in order to reliably differentiate right from left. CONTRAST:  75mL ISOVUE-300 IOPAMIDOL (ISOVUE-300) INJECTION 61% COMPARISON:  None. FINDINGS: Visualized portions of the brain are within normal limits. Globes and orbital soft tissues normal. No appreciable soft tissue swelling identified within the face. Salivary glands including the parotid glands and submandibular glands are within normal limits. No inflammatory changes identified about the dentition. No acute osseous abnormality identified within the face. Paranasal sinuses are clear. Trace right mastoid effusion noted. Middle ear cavities are well pneumatized. Visualized portions of the oropharynx and supraglottic larynx are within normal limits. Degenerative changes noted within the upper cervical spine. IMPRESSION: No acute maxillofacial abnormality identified. Electronically Signed   By: Rise Mu M.D.   On: 03/07/2016 03:33   Dg Foot Complete Left  Result Date: 04/02/2016 CLINICAL DATA:  65 year old female with left foot pain.  Diabetes. EXAM: LEFT FOOT - COMPLETE 3+ VIEW COMPARISON:  None. FINDINGS: There is no acute fracture or dislocation. The bones are osteopenic. There is diffuse soft tissue and skin edema. No soft tissue gas or radiopaque foreign object. IMPRESSION: No acute fracture or dislocation. Diffuse soft tissue edema and skin thickening. Electronically Signed   By: Elgie Collard M.D.   On: 04/02/2016 06:08      TODAY-DAY OF DISCHARGE:  Subjective:   [name] today has no headache,no chest abdominal pain,no new weakness tingling or numbness, feels much better wants to go home today.   Objective:   Blood pressure 125/71, pulse 94, temperature 98.4 F (36.9 C), temperature source Oral, resp. rate 16, height 5\' 5"  (1.651 m), weight (!) 161 kg (355 lb), SpO2 99 %.  Intake/Output Summary (Last 24 hours) at 04/03/16 1059 Last data filed at 04/03/16 0700  Gross per 24 hour  Intake                0 ml  Output              760 ml  Net             -760 ml   Filed Weights   04/02/16 0115  Weight: (!) 161 kg (355 lb)    Exam: Awake Alert,  Oriented *3, No new F.N deficits, Normal affect [place].AT,PERRAL Supple Neck,No JVD, No cervical lymphadenopathy appriciated.  Symmetrical Chest wall movement, Good air movement bilaterally, CTAB RRR,No Gallops,Rubs or new Murmurs, No Parasternal Heave +ve B.Sounds, Abd Soft, Non tender, No organomegaly appriciated, No rebound -guarding or rigidity. No Cyanosis, Clubbing or edema, No new Rash or bruise   PERTINENT RADIOLOGIC STUDIES: Dg Tibia/fibula Left  Result Date: 03/24/2016 CLINICAL DATA:  Left lower extremity cellulitis.  Pain. EXAM: LEFT TIBIA AND FIBULA - 2 VIEW COMPARISON:  None. FINDINGS: The left tibia and fibula are intact. No evidence for fracture. Soft tissue irregularity along the medial lower leg. There appears to be mild degenerative changes along medial aspect of the left knee. Evidence for calcaneal spurring. IMPRESSION: No acute bone abnormality. Electronically Signed   By: Richarda Overlie M.D.   On: 03/24/2016 14:50   Mr Lumbar Spine Wo Contrast  Result Date: 04/02/2016 CLINICAL DATA:  65 year old female with morbid obesity and chronic back pain. Presents with left lower extremity pain. Left lower extremity cellulitis and wound being treated recently with some improvement. However, leg pain is  worsening. Possible left leg weakness/radiculopathy. Initial encounter. The examination had to be discontinued prior to completion due to patient refusal to continue. EXAM: MRI LUMBAR SPINE WITHOUT CONTRAST TECHNIQUE: Multiplanar, multisequence MR imaging of the lumbar spine was performed. No intravenous contrast was administered. COMPARISON:  None. FINDINGS: Sagittal T2 and STIR imaging only was obtained. Segmentation: Lumbar segmentation appears to be normal and will be designated as such for this report. Alignment: Suggestion of mild lumbar scoliosis. Trace anterolisthesis of L4 on L5, and L3 on L4. Vertebrae: Lower lumbar degenerative endplate changes. No marrow edema identified in the lower thoracic or lumbar spine. However, sagittal STIR series 4 image 11 suggests left sacral ala marrow edema. Conus medullaris: Extends to the L1-L2 level and appears normal. Paraspinal and other soft tissues: Partially visible it distension of the urinary bladder. Visualized abdominal viscera and paraspinal soft tissues are within normal limits. Disc levels: Multilevel lumbar disc bulging. More advanced lumbar disc and endplate degeneration at L5-S1. Moderate to severe lumbar facet hypertrophy from L3-L4 to L5-S1 with some mostly right side facet joint fluid. Up to mild lumbar spinal stenosis results. Based on the provided sagittal images there is no high-grade lumbar spinal stenosis. IMPRESSION: 1. The examination had to be discontinued prior to completion. No axial imaging was obtained. 2. Evidence of marrow edema in the left sacral ala. This is nonspecific but could be seen with sacral fracture (including insufficiency fracture) and osteomyelitis. Pelvis or SI joint MRI would be ideal for further evaluation. Failing that, Pelvis CT might be most valuable. 3. Moderate to severe lower lumbar facet arthropathy. Advanced disc and endplate degeneration at L5-S1. Only mild lumbar spinal stenosis is suspected. Electronically Signed    By: Odessa Fleming M.D.   On: 04/02/2016 11:54   Ct Maxillofacial W Contrast  Result Date: 03/07/2016 CLINICAL DATA:  Initial evaluation for acute right-sided facial pain. EXAM: CT MAXILLOFACIAL WITH CONTRAST TECHNIQUE: Multidetector CT imaging of the maxillofacial structures was performed with intravenous contrast. Multiplanar CT image reconstructions were also generated. A small metallic BB was placed on the right temple in order to reliably differentiate right from left. CONTRAST:  75mL ISOVUE-300 IOPAMIDOL (ISOVUE-300) INJECTION 61% COMPARISON:  None. FINDINGS: Visualized portions of the brain are within normal limits. Globes and orbital soft tissues normal. No appreciable soft tissue swelling identified within the face. Salivary glands including the parotid glands  and submandibular glands are within normal limits. No inflammatory changes identified about the dentition. No acute osseous abnormality identified within the face. Paranasal sinuses are clear. Trace right mastoid effusion noted. Middle ear cavities are well pneumatized. Visualized portions of the oropharynx and supraglottic larynx are within normal limits. Degenerative changes noted within the upper cervical spine. IMPRESSION: No acute maxillofacial abnormality identified. Electronically Signed   By: Rise MuBenjamin  McClintock M.D.   On: 03/07/2016 03:33   Dg Foot Complete Left  Result Date: 04/02/2016 CLINICAL DATA:  65 year old female with left foot pain.  Diabetes. EXAM: LEFT FOOT - COMPLETE 3+ VIEW COMPARISON:  None. FINDINGS: There is no acute fracture or dislocation. The bones are osteopenic. There is diffuse soft tissue and skin edema. No soft tissue gas or radiopaque foreign object. IMPRESSION: No acute fracture or dislocation. Diffuse soft tissue edema and skin thickening. Electronically Signed   By: Elgie CollardArash  Radparvar M.D.   On: 04/02/2016 06:08     PERTINENT LAB RESULTS: CBC:  Recent Labs  04/02/16 0129 04/03/16 0557  WBC 7.9 5.7    HGB 13.0 12.4  HCT 39.3 37.9  PLT 291 311   CMET CMP     Component Value Date/Time   NA 137 04/03/2016 0557   K 3.7 04/03/2016 0557   CL 105 04/03/2016 0557   CO2 24 04/03/2016 0557   GLUCOSE 186 (H) 04/03/2016 0557   BUN 6 04/03/2016 0557   CREATININE 0.67 04/03/2016 0557   CREATININE 0.88 01/25/2016 0001   CALCIUM 9.1 04/03/2016 0557   PROT 7.5 04/03/2016 0557   ALBUMIN 3.0 (L) 04/03/2016 0557   AST 15 04/03/2016 0557   ALT 14 04/03/2016 0557   ALKPHOS 98 04/03/2016 0557   BILITOT 0.4 04/03/2016 0557   GFRNONAA >60 04/03/2016 0557   GFRAA >60 04/03/2016 0557    GFR Estimated Creatinine Clearance: 107.7 mL/min (by C-G formula based on SCr of 0.67 mg/dL). No results for input(s): LIPASE, AMYLASE in the last 72 hours. No results for input(s): CKTOTAL, CKMB, CKMBINDEX, TROPONINI in the last 72 hours. Invalid input(s): POCBNP No results for input(s): DDIMER in the last 72 hours. No results for input(s): HGBA1C in the last 72 hours. No results for input(s): CHOL, HDL, LDLCALC, TRIG, CHOLHDL, LDLDIRECT in the last 72 hours. No results for input(s): TSH, T4TOTAL, T3FREE, THYROIDAB in the last 72 hours.  Invalid input(s): FREET3 No results for input(s): VITAMINB12, FOLATE, FERRITIN, TIBC, IRON, RETICCTPCT in the last 72 hours. Coags: No results for input(s): INR in the last 72 hours.  Invalid input(s): PT Microbiology: Recent Results (from the past 240 hour(s))  Wound or Superficial Culture     Status: None   Collection Time: 03/24/16 12:10 PM  Result Value Ref Range Status   Specimen Description WOUND LEFT ANKLE  Final   Special Requests Normal  Final   Gram Stain   Final    RARE WBC PRESENT, PREDOMINANTLY PMN ABUNDANT GRAM NEGATIVE RODS ABUNDANT GRAM POSITIVE COCCI IN CLUSTERS MODERATE GRAM POSITIVE RODS    Culture   Final    ABUNDANT ESCHERICHIA COLI ABUNDANT DIPHTHEROIDS(CORYNEBACTERIUM SPECIES) Standardized susceptibility testing for this organism is not  available.    Report Status 03/26/2016 FINAL  Final   Organism ID, Bacteria ESCHERICHIA COLI  Final      Susceptibility   Escherichia coli - MIC*    AMPICILLIN <=2 SENSITIVE Sensitive     CEFAZOLIN <=4 SENSITIVE Sensitive     CEFEPIME <=1 SENSITIVE Sensitive     CEFTAZIDIME <=  1 SENSITIVE Sensitive     CEFTRIAXONE <=1 SENSITIVE Sensitive     CIPROFLOXACIN <=0.25 SENSITIVE Sensitive     GENTAMICIN <=1 SENSITIVE Sensitive     IMIPENEM <=0.25 SENSITIVE Sensitive     TRIMETH/SULFA <=20 SENSITIVE Sensitive     AMPICILLIN/SULBACTAM <=2 SENSITIVE Sensitive     PIP/TAZO <=4 SENSITIVE Sensitive     Extended ESBL NEGATIVE Sensitive     * ABUNDANT ESCHERICHIA COLI  Culture, blood (Routine x 2)     Status: None   Collection Time: 03/24/16 12:20 PM  Result Value Ref Range Status   Specimen Description BLOOD LEFT ANTECUBITAL  Final   Special Requests BOTTLES DRAWN AEROBIC ONLY 10CC  Final   Culture NO GROWTH 5 DAYS  Final   Report Status 03/29/2016 FINAL  Final    FURTHER DISCHARGE INSTRUCTIONS:  Get Medicines reviewed and adjusted: Please take all your medications with you for your next visit with your Primary MD  Laboratory/radiological data: Please request your Primary MD to go over all hospital tests and procedure/radiological results at the follow up, please ask your Primary MD to get all Hospital records sent to his/her office.  In some cases, they will be blood work, cultures and biopsy results pending at the time of your discharge. Please request that your primary care M.D. goes through all the records of your hospital data and follows up on these results.  Also Note the following: If you experience worsening of your admission symptoms, develop shortness of breath, life threatening emergency, suicidal or homicidal thoughts you must seek medical attention immediately by calling 911 or calling your MD immediately  if symptoms less severe.  You must read complete  instructions/literature along with all the possible adverse reactions/side effects for all the Medicines you take and that have been prescribed to you. Take any new Medicines after you have completely understood and accpet all the possible adverse reactions/side effects.   Do not drive when taking Pain medications or sleeping medications (Benzodaizepines)  Do not take more than prescribed Pain, Sleep and Anxiety Medications. It is not advisable to combine anxiety,sleep and pain medications without talking with your primary care practitioner  Special Instructions: If you have smoked or chewed Tobacco  in the last 2 yrs please stop smoking, stop any regular Alcohol  and or any Recreational drug use.  Wear Seat belts while driving.  Please note: You were cared for by a hospitalist during your hospital stay. Once you are discharged, your primary care physician will handle any further medical issues. Please note that NO REFILLS for any discharge medications will be authorized once you are discharged, as it is imperative that you return to your primary care physician (or establish a relationship with a primary care physician if you do not have one) for your post hospital discharge needs so that they can reassess your need for medications and monitor your lab values.  Total Time spent coordinating discharge including counseling, education and face to face time equals 25 minutes.  SignedJeoffrey Massed 04/03/2016 10:59 AM

## 2016-04-03 NOTE — Progress Notes (Signed)
Inpatient Diabetes Program Recommendations  AACE/ADA: New Consensus Statement on Inpatient Glycemic Control (2015)  Target Ranges:  Prepandial:   less than 140 mg/dL      Peak postprandial:   less than 180 mg/dL (1-2 hours)      Critically ill patients:  140 - 180 mg/dL   Lab Results  Component Value Date   GLUCAP 233 (H) 04/03/2016   HGBA1C 9.7 (H) 03/24/2016    Review of Glycemic Control  Inpatient Diabetes Program Recommendations:   Spoke with patient @ bedside regarding elevated A1c of 9.7 and medications @ home. Patient currently has long acting [place]-B[place]iba and Apidra @ home to use and states her physician's office assisting with getting her insulins. Patient would like to attend outpatient diabetes education when she is able to attend so order placed for cosign. Gave information regarding A1c and basic plate method. Patient states her nutrition weakness is cakes and cookies because easily available due to husband having in home. Patient states willingness to make changes in nutrition to assist with glycemic control.  Thank you, Billy FischerJudy E. Kennley Schwandt, RN, MSN, CDE Inpatient Glycemic Control Team Team Pager [phone] (8am-5pm) 04/03/2016 11:20 AM

## 2016-04-03 NOTE — Care Management Note (Signed)
Case Management Note  Patient Details  Name: [name] MRN: [id number] Date of Birth: May 04, 1951  Subjective/Objective:    CM following for progression and d/c planning.                 Action/Plan: 04/03/2016 Met with pt re [facility] needs, pt has previously used [facility] services and wishes to use that agency again. This CM notified [facility] rep, Danny Lawless and referral placed. Wide ( bariatric) walker ordered for [facility] for delivery to room, pt wt 355lb and ht 5'5".  Expected Discharge Date:    04/03/2016              Expected Discharge Plan:  [place]  In-House Referral:  NA  Discharge planning Services  CM Consult  Post Acute Care Choice:  Home Health, Durable Medical Equipment Choice offered to:  Patient  DME Arranged:  [facility] wide DME Agency:  [place]:  PT [place] Agency:   ([place])  Status of Service:  Completed, signed off  If discussed at [place] of Stay Meetings, dates discussed:    Additional Comments:  Adron Bene, RN 04/03/2016, 11:55 AM

## 2016-04-03 NOTE — Progress Notes (Signed)
Patient discharge teaching given, including activity, diet, follow-up appoints, and medications. Patient verbalized understanding of all discharge instructions. IV access was d/c'd. Vitals are stable. Skin is intact except as charted in most recent assessments. Pt to be escorted out by NT, to be driven home by family.  Noa Constante, MBA, BSN, RN 

## 2016-04-03 NOTE — Telephone Encounter (Signed)
Pt Assistance Apidra received & given to spouse. Also faxed additional pharmacy expense receipts to [employer] for appeal

## 2016-04-03 NOTE — Evaluation (Signed)
Physical Therapy Evaluation & discharge Patient Details Name: [name] MRN: [id number] DOB: October 09, 1950 Today's Date: 04/03/2016   History of Present Illness  65 year old lady with past medical history of morbid obesity, chronic back pain, TIA, hypertension, hyperlipidemia, diabetes mellitus, gout, anxiety, who presented to ED with left lower leg pain.   Clinical Impression  Pt transferring with S with use of bariatric RW.  Pt reports her son and husband can A her at current level.  She reports her RW is in [facility] where they moved from over the summer.  She would benefit from a bariatric RW for increased safety with transfers. Recommend HHPT as well.  No further acute PT needs identified and will d/c at this time.  Please re-order if pt's status changes.    Follow Up Recommendations Home health PT;Supervision for mobility/OOB    Equipment Recommendations  Rolling walker with 5" wheels (bariatric)    Recommendations for Other Services       Precautions / Restrictions Precautions Precautions: Fall Required Braces or Orthoses: Other Brace/Splint Other Brace/Splint: Wrap to L LE Restrictions Weight Bearing Restrictions: No      Mobility  Bed Mobility               General bed mobility comments: Sitting EOB upon arrival  Transfers Overall transfer level: Needs assistance Equipment used: Rolling walker (2 wheeled) Transfers: Sit to/from [facility] Sit to Stand: Min guard Stand pivot transfers: Supervision       General transfer comment: MIN/guard with powering up and cues for technique and took 2 attempts, bothe from bed and BSC. Once up, able to do SPT with RW and S.  Ambulation/Gait             General Gait Details: SPT only  Stairs            Wheelchair Mobility    Modified Rankin (Stroke Patients Only)       Balance Overall balance assessment: Needs assistance           Standing balance-Leahy Scale: Fair                               Pertinent Vitals/Pain Faces Pain Scale: Hurts a little bit Pain Location: low back Pain Intervention(s): Monitored during session    Home Living Family/patient expects to be discharged to:: Private residence Living Arrangements: Spouse/significant other Available Help at Discharge: Family;Available PRN/intermittently Type of Home: Apartment Home Access: Level entry     Home Layout: One level Home Equipment: Wheelchair - manual;Wheelchair - power;Bedside commode Additional Comments: Pt primarily uses the w/c to mobilize.    Prior Function Level of Independence: Needs assistance   Gait / Transfers Assistance Needed: pt only transfers at baseline.    ADL's / Homemaking Assistance Needed: pt able to perform grooming tasks, but needs A for other ADLs and all homemaking tasks.    Comments: Pt has reacher/sock aid etc to assist with adls but usually husband just assists her.     Hand Dominance   Dominant Hand: Right    Extremity/Trunk Assessment               Lower Extremity Assessment: Overall WFL for tasks assessed;LLE deficits/detail   LLE Deficits / Details: Wrap on L LE due to cellultis and wound  Cervical / Trunk Assessment: Normal  Communication   Communication: No difficulties  Cognition Arousal/Alertness: Awake/alert Behavior During Therapy: WFL for tasks assessed/performed  Overall Cognitive Status: Within Functional Limits for tasks assessed                      General Comments General comments (skin integrity, edema, etc.): Discussed elevating L LE for edema management    Exercises     Assessment/Plan    PT Assessment All further PT needs can be met in the next venue of care;Patent does not need any further PT services  PT Problem List Decreased activity tolerance;Decreased balance;Decreased knowledge of use of DME;Obesity          PT Treatment Interventions      PT Goals (Current goals can be found in the  Care Plan section)  Acute Rehab PT Goals Patient Stated Goal: to go home after PT PT Goal Formulation: All assessment and education complete, DC therapy    Frequency     Barriers to discharge        Co-evaluation               End of Session Equipment Utilized During Treatment: Gait belt Activity Tolerance: Patient tolerated treatment well Patient left: in bed;with call bell/phone within reach (sitting EOB) Nurse Communication: Mobility status    Functional Assessment Tool Used: clinical judgement and objective findings Functional Limitation: Changing and maintaining body position Changing and Maintaining Body Position Current Status (T6244): At least 1 percent but less than 20 percent impaired, limited or restricted Changing and Maintaining Body Position Goal Status (C9507): At least 1 percent but less than 20 percent impaired, limited or restricted Changing and Maintaining Body Position Discharge Status [phone]): At least 1 percent but less than 20 percent impaired, limited or restricted    Time: [phone] PT Time Calculation (min) (ACUTE ONLY): 19 min   Charges:   PT Evaluation $PT Eval Low Complexity: 1 Procedure     PT G Codes:   PT G-Codes **NOT FOR INPATIENT CLASS** Functional Assessment Tool Used: clinical judgement and objective findings Functional Limitation: Changing and maintaining body position Changing and Maintaining Body Position Current Status (I5189): At least 1 percent but less than 20 percent impaired, limited or restricted Changing and Maintaining Body Position Goal Status (Q4210): At least 1 percent but less than 20 percent impaired, limited or restricted Changing and Maintaining Body Position Discharge Status [phone]): At least 1 percent but less than 20 percent impaired, limited or restricted    [facility] LUBECK 04/03/2016, 9:32 AM

## 2016-04-04 ENCOUNTER — Telehealth: Payer: Self-pay | Admitting: Internal Medicine

## 2016-04-04 ENCOUNTER — Encounter (HOSPITAL_COMMUNITY): Payer: Medicare PPO

## 2016-04-04 NOTE — Telephone Encounter (Signed)
Gregary SignsSean was informed verbally

## 2016-04-04 NOTE — Telephone Encounter (Signed)
ok 

## 2016-04-04 NOTE — Telephone Encounter (Signed)
Sean with [facility] called and wants verbal orders to work with patient to do strenghtening, transferring and balance training as she was recently in hospital  Verbal orders to work with patient today 04/04/16 and then 2x a week for 3 weeks.  Gregary SignsSean [phone]

## 2016-04-05 NOTE — ED Provider Notes (Signed)
[facility]-EMERGENCY DEPT Provider Note   CSN: [id number] Arrival date & time:        History   Chief Complaint No chief complaint on file.   HPI [name] is a 65 y.o. female.  HPI Patient evaluated 03/24/16 for left lower extremity, swelling, warmth, redness and foul-smelling discharge. Recently treated for cellulitis. Denies fever or chills. Denies shortness of breath or chest pain. Past Medical History:  Diagnosis Date  . Anxiety   . Arthritis    "back, arms, legs" (03/24/2016)  . Asthma   . Chronic lower back pain   . Colonic polyp    last colonoscopy done in 2009 with normal results per medical record  . Depressive disorder   . Gastric polyp   . Gout    has taken allopurinol 300mg  once daily in past  . Headache   . History of hiatal hernia   . Hypertension   . Migraine    "none in awhile; might have a couple/year" (03/24/2016)  . Mixed hyperlipidemia    01/2016 Total chol 141, HDL 59, LDL 63, ration 1.1  . Osteoarthritis   . TIA (transient ischemic attack) 11/2014  . Type II diabetes mellitus [facility])     Patient Active Problem List   Diagnosis Date Noted  . Left Lower extremity pain  04/02/2016  . Lower extremity pain, inferior, left 04/02/2016  . Left leg pain   . Personal history of noncompliance with medical treatment, presenting hazards to health 03/31/2016  . Jaw pain 03/27/2016  . Left leg cellulitis 03/24/2016  . Cellulitis of left lower extremity   . Diabetes mellitus with complication (HCC)   . Primary insomnia   . Adjustment disorder   . Morbid obesity (HCC) 01/27/2016  . Wheelchair dependent 01/27/2016  . Gout 01/27/2016  . Asthma 01/27/2016  . Arthritis 01/27/2016  . Uncontrolled diabetes mellitus type 2 with peripheral artery disease (HCC) 01/25/2016  . Cellulitis of left leg 01/11/2016  . DM (diabetes mellitus) (HCC) 01/11/2016  . Hypertension 01/11/2016    Past Surgical History:  Procedure Laterality Date  . BREAST BIOPSY Left ~ 2015     benign  . CARPAL TUNNEL RELEASE Bilateral   . DILATION AND CURETTAGE OF UTERUS    . KNEE ARTHROSCOPY Right 2003   in [place]  . LAPAROSCOPIC CHOLECYSTECTOMY    . TUBAL LIGATION    . VAGINAL HYSTERECTOMY  1982    OB History    No data available       Home Medications    Prior to Admission medications   Medication Sig Start Date End Date Taking? Authorizing Provider  albuterol (PROVENTIL HFA;VENTOLIN HFA) 108 (90 Base) MCG/ACT inhaler Inhale 2 puffs into the lungs every 6 (six) hours as needed for wheezing or shortness of breath.    Historical Provider, MD  allopurinol (ZYLOPRIM) 300 MG tablet Take 300 mg by mouth daily.    Historical Provider, MD  aspirin 325 MG tablet Take 325 mg by mouth daily.    Historical Provider, MD  atenolol (TENORMIN) 25 MG tablet Take 25-50 mg by mouth daily. Take 50 mg every morning  Take 25 mg every evening    Historical Provider, MD  clonazePAM (KLONOPIN) 0.5 MG tablet Take 1 tablet (0.5 mg total) by mouth daily as needed for anxiety. 02/25/16   Avanell Shackleton, NP  cyclobenzaprine (FLEXERIL) 10 MG tablet Take 10 mg by mouth 3 (three) times daily as needed for muscle spasms.    Historical Provider, MD  diclofenac sodium (VOLTAREN) 1 % GEL Apply 4 g topically 4 (four) times daily. 02/25/16   Avanell ShackletonVickie L Henson, NP  gabapentin (NEURONTIN) 300 MG capsule Take 1 capsule (300 mg total) by mouth 2 (two) times daily. 01/31/16   Avanell ShackletonVickie L Henson, NP  hydrocerin (EUCERIN) CREA Apply 1 application topically daily. 01/15/16   Leroy SeaPrashant K Singh, MD  hydrOXYzine (VISTARIL) 50 MG capsule Take 50 mg by mouth 3 (three) times daily as needed for itching.     Historical Provider, MD  insulin degludec (TRESIBA FLEXTOUCH) 100 UNIT/ML SOPN FlexTouch Pen Inject 0.75 mLs (75 Units total) into the skin daily. 03/14/16   Avanell ShackletonVickie L Henson, NP  NIFEdipine (PROCARDIA XL/ADALAT-CC) 90 MG 24 hr tablet Take 90 mg by mouth daily.    Historical Provider, MD    Family History Family History   Problem Relation Age of Onset  . Stroke Father   . Stroke Brother   . Cancer Other     Social History Social History  Substance Use Topics  . Smoking status: Never Smoker  . Smokeless tobacco: Never Used  . Alcohol use No     Allergies   Ace inhibitors and Metformin and related   Review of Systems Review of Systems  Constitutional: Negative for chills, fatigue and fever.  Respiratory: Negative for shortness of breath.   Cardiovascular: Positive for leg swelling. Negative for chest pain.  Gastrointestinal: Negative for abdominal pain, nausea and vomiting.  Musculoskeletal: Positive for myalgias. Negative for back pain, neck pain and neck stiffness.  Skin: Positive for color change and wound.  Neurological: Negative for weakness and numbness.  All other systems reviewed and are negative.    Physical Exam Updated Vital Signs There were no vitals taken for this visit.  Physical Exam  Constitutional: She is oriented to person, place, and time. She appears well-developed and well-nourished.  HENT:  Head: Normocephalic and atraumatic.  Mouth/Throat: Oropharynx is clear and moist.  Eyes: EOM are normal. Pupils are equal, round, and reactive to light.  Neck: Normal range of motion. Neck supple.  Cardiovascular: Normal rate and regular rhythm.   Pulmonary/Chest: Effort normal and breath sounds normal.  Abdominal: Soft. Bowel sounds are normal. There is no tenderness. There is no rebound and no guarding.  Musculoskeletal: Normal range of motion. She exhibits edema and tenderness.  Chronic appearing scaling of the bilateral lower extremities. Patient has erythema and warmth to the left lower extremity with a roughly 4 cm purulent draining wound.  Neurological: She is alert and oriented to person, place, and time.  Skin: Skin is warm and dry. No rash noted. No erythema.  Psychiatric: She has a normal mood and affect. Her behavior is normal.  Nursing note and vitals  reviewed.    ED Treatments / Results  Labs (all labs ordered are listed, but only abnormal results are displayed) Labs Reviewed - No data to display  EKG  EKG Interpretation None       Radiology No results found.  Procedures Procedures (including critical care time)  Medications Ordered in ED Medications - No data to display   Initial Impression / Assessment and Plan / ED Course  I have reviewed the triage vital signs and the nursing notes.  Pertinent labs & imaging results that were available during my care of the patient were reviewed by me and considered in my medical decision making (see chart for details).  Clinical Course    Ultrasound rule out DVT. Started broad-spectrum antibiotics. Discuss with  hospitalist and we'll see patient in emergency department and admit.  Final Clinical Impressions(s) / ED Diagnoses   Final diagnoses:  None  Clinical impression: Left lower extremity cellulitis  New Prescriptions New Prescriptions   No medications on file     Loren Racer, MD 04/05/16 1655

## 2016-04-07 ENCOUNTER — Emergency Department (HOSPITAL_COMMUNITY)
Admission: EM | Admit: 2016-04-07 | Discharge: 2016-04-09 | Disposition: A | Discharge status: Home / Self Care | Payer: Medicare PPO | Attending: Emergency Medicine | Admitting: Emergency Medicine

## 2016-04-07 ENCOUNTER — Encounter (HOSPITAL_COMMUNITY): Payer: Self-pay | Admitting: Emergency Medicine

## 2016-04-07 ENCOUNTER — Encounter: Payer: Self-pay | Admitting: Family Medicine

## 2016-04-07 ENCOUNTER — Emergency Department (HOSPITAL_COMMUNITY)
Admission: EM | Admit: 2016-04-07 | Discharge: 2016-04-07 | Disposition: A | Discharge status: Home / Self Care | Payer: Medicare PPO | Source: Home / Self Care | Attending: Emergency Medicine | Admitting: Emergency Medicine

## 2016-04-07 ENCOUNTER — Emergency Department (HOSPITAL_BASED_OUTPATIENT_CLINIC_OR_DEPARTMENT_OTHER): Admit: 2016-04-07 | Discharge: 2016-04-07 | Disposition: A | Discharge status: Home / Self Care | Payer: Medicare PPO

## 2016-04-07 DIAGNOSIS — J45909 Unspecified asthma, uncomplicated: Secondary | ICD-10-CM | POA: Insufficient documentation

## 2016-04-07 DIAGNOSIS — M25561 Pain in right knee: Secondary | ICD-10-CM

## 2016-04-07 DIAGNOSIS — M79604 Pain in right leg: Secondary | ICD-10-CM | POA: Diagnosis present

## 2016-04-07 DIAGNOSIS — M25562 Pain in left knee: Secondary | ICD-10-CM

## 2016-04-07 DIAGNOSIS — I10 Essential (primary) hypertension: Secondary | ICD-10-CM

## 2016-04-07 DIAGNOSIS — Z8673 Personal history of transient ischemic attack (TIA), and cerebral infarction without residual deficits: Secondary | ICD-10-CM | POA: Diagnosis not present

## 2016-04-07 DIAGNOSIS — Z7982 Long term (current) use of aspirin: Secondary | ICD-10-CM | POA: Diagnosis not present

## 2016-04-07 DIAGNOSIS — Z794 Long term (current) use of insulin: Secondary | ICD-10-CM | POA: Diagnosis not present

## 2016-04-07 DIAGNOSIS — M79605 Pain in left leg: Secondary | ICD-10-CM | POA: Insufficient documentation

## 2016-04-07 DIAGNOSIS — Z79899 Other long term (current) drug therapy: Secondary | ICD-10-CM

## 2016-04-07 DIAGNOSIS — E119 Type 2 diabetes mellitus without complications: Secondary | ICD-10-CM | POA: Insufficient documentation

## 2016-04-07 DIAGNOSIS — R5381 Other malaise: Secondary | ICD-10-CM

## 2016-04-07 MED ORDER — HYDROCODONE-ACETAMINOPHEN 5-325 MG PO TABS
1.0000 | ORAL_TABLET | Freq: Once | ORAL | Status: AC
  Administered 2016-04-08: 1 via ORAL
  Filled 2016-04-07: qty 1

## 2016-04-07 NOTE — ED Notes (Signed)
Per EMS- Pt was just discharged for cellulitis tonight. Upon reaching home, Pt unable to transfer from stretcher to recliner, Son states that pt is unable to move self around to take care of ADLS and stated that he would like placement for her.

## 2016-04-07 NOTE — Discharge Instructions (Signed)
Please read attached information. If you experience any new or worsening signs or symptoms please return to the emergency room for evaluation. Please follow-up with your primary care provider or specialist as discussed. Please use medication prescribed only as directed and discontinue taking if you have any concerning signs or symptoms.   °

## 2016-04-07 NOTE — ED Notes (Signed)
MD at bedside. 

## 2016-04-07 NOTE — ED Notes (Signed)
Bed: WA07 Expected date:  Expected time:  Means of arrival:  Comments: EMS 

## 2016-04-07 NOTE — ED Triage Notes (Signed)
Per EMS pt from home c/o bilateral knee pain and right thigh pain. Pt has been seen twice this past week for same c/o and was supposed to have appt today to unwrap legs from cellulitis.

## 2016-04-07 NOTE — ED Provider Notes (Signed)
[facility]-EMERGENCY DEPT Provider Note   CSN: [id number] Arrival date & time: 04/07/16  1735   History   Chief Complaint Chief Complaint  Patient presents with  . Knee Pain    HPI [name] is a 65 y.o. female.    HPI   65 year old female presents today with complaints of right leg pain. Patient was most recently discharged from the hospital on 04/03/2016 approximately 4 days ago. She was admitted due to left lower leg pain and inability to move it. She was evaluated further during her admission with MRI which showed no significant findings. Patient's pain was managed, she was able to use the lower extremities. She had no signs of significant systemic infection, she was discharged home with physical therapy and wound care. Patient notes that after leaving the hospital she continued to have lower extremity pain, worse on the right. She reports the pain is worse in her right calf. She notes that she has been unable to ambulate, usually is in a wheelchair, but has been unable to stand. She denies any worsening signs of infection, she denies any fever or chills, denies any neurological deficits. Ports that she consult at her insurance company who recommended she come to the emergency room.   Past Medical History:  Diagnosis Date  . Anxiety   . Arthritis    "back, arms, legs" (03/24/2016)  . Asthma   . Chronic lower back pain   . Colonic polyp    last colonoscopy done in 2009 with normal results per medical record  . Depressive disorder   . Gastric polyp   . Gout    has taken allopurinol 300mg  once daily in past  . Headache   . History of hiatal hernia   . Hypertension   . Migraine    "none in awhile; might have a couple/year" (03/24/2016)  . Mixed hyperlipidemia    01/2016 Total chol 141, HDL 59, LDL 63, ration 1.1  . Osteoarthritis   . TIA (transient ischemic attack) 11/2014  . Type II diabetes mellitus [facility])     Patient Active Problem List   Diagnosis Date Noted  . Left  Lower extremity pain  04/02/2016  . Lower extremity pain, inferior, left 04/02/2016  . Left leg pain   . Personal history of noncompliance with medical treatment, presenting hazards to health 03/31/2016  . Jaw pain 03/27/2016  . Left leg cellulitis 03/24/2016  . Cellulitis of left lower extremity   . Diabetes mellitus with complication (HCC)   . Primary insomnia   . Adjustment disorder   . Morbid obesity (HCC) 01/27/2016  . Wheelchair dependent 01/27/2016  . Gout 01/27/2016  . Asthma 01/27/2016  . Arthritis 01/27/2016  . Uncontrolled diabetes mellitus type 2 with peripheral artery disease (HCC) 01/25/2016  . Cellulitis of left leg 01/11/2016  . DM (diabetes mellitus) (HCC) 01/11/2016  . Hypertension 01/11/2016    Past Surgical History:  Procedure Laterality Date  . BREAST BIOPSY Left ~ 2015   benign  . CARPAL TUNNEL RELEASE Bilateral   . DILATION AND CURETTAGE OF UTERUS    . KNEE ARTHROSCOPY Right 2003   in [place]  . LAPAROSCOPIC CHOLECYSTECTOMY    . TUBAL LIGATION    . VAGINAL HYSTERECTOMY  1982    OB History    No data available       Home Medications    Prior to Admission medications   Medication Sig Start Date End Date Taking? Authorizing Provider  albuterol (PROVENTIL HFA;VENTOLIN HFA) 108 (90  Base) MCG/ACT inhaler Inhale 2 puffs into the lungs every 6 (six) hours as needed for wheezing or shortness of breath.    Historical Provider, MD  allopurinol (ZYLOPRIM) 300 MG tablet Take 300 mg by mouth daily.    Historical Provider, MD  aspirin 325 MG tablet Take 325 mg by mouth daily.    Historical Provider, MD  atenolol (TENORMIN) 25 MG tablet Take 25-50 mg by mouth daily. Take 50 mg every morning  Take 25 mg every evening    Historical Provider, MD  clonazePAM (KLONOPIN) 0.5 MG tablet Take 1 tablet (0.5 mg total) by mouth daily as needed for anxiety. 02/25/16   Avanell ShackletonVickie L Henson, NP  cyclobenzaprine (FLEXERIL) 10 MG tablet Take 10 mg by mouth 3 (three) times daily as needed  for muscle spasms.    Historical Provider, MD  diclofenac sodium (VOLTAREN) 1 % GEL Apply 4 g topically 4 (four) times daily. 02/25/16   Avanell ShackletonVickie L Henson, NP  gabapentin (NEURONTIN) 300 MG capsule Take 1 capsule (300 mg total) by mouth 2 (two) times daily. 01/31/16   Avanell ShackletonVickie L Henson, NP  hydrocerin (EUCERIN) CREA Apply 1 application topically daily. 01/15/16   Leroy SeaPrashant K Singh, MD  hydrOXYzine (VISTARIL) 50 MG capsule Take 50 mg by mouth 3 (three) times daily as needed for itching.     Historical Provider, MD  insulin degludec (TRESIBA FLEXTOUCH) 100 UNIT/ML SOPN FlexTouch Pen Inject 0.75 mLs (75 Units total) into the skin daily. 03/14/16   Avanell ShackletonVickie L Henson, NP  NIFEdipine (PROCARDIA XL/ADALAT-CC) 90 MG 24 hr tablet Take 90 mg by mouth daily.    Historical Provider, MD    Family History Family History  Problem Relation Age of Onset  . Stroke Father   . Stroke Brother   . Cancer Other     Social History Social History  Substance Use Topics  . Smoking status: Never Smoker  . Smokeless tobacco: Never Used  . Alcohol use No     Allergies   Ace inhibitors and Metformin and related   Review of Systems Review of Systems  All other systems reviewed and are negative.   Physical Exam Updated Vital Signs BP 135/68 (BP Location: Right Wrist)   Pulse 98   Temp 99.3 F (37.4 C) (Oral)   Resp 16   SpO2 95%   Physical Exam  Constitutional: She is oriented to person, place, and time. She appears well-developed and well-nourished.  HENT:  Head: Normocephalic and atraumatic.  Eyes: Conjunctivae are normal. Pupils are equal, round, and reactive to light. Right eye exhibits no discharge. Left eye exhibits no discharge. No scleral icterus.  Neck: Normal range of motion. No JVD present. No tracheal deviation present.  Pulmonary/Chest: Effort normal. No stridor.  Musculoskeletal:  Tenderness to palpation of the right calfsignificant swelling or edema  Neurological: She is alert and oriented to  person, place, and time. Coordination normal.  Skin:  Chronic changes to bilateral lower extremities. Left lower extremity status post cellulitis, no signs of infection on today's exam. No warmth to touch, no discharge.   Psychiatric: She has a normal mood and affect. Her behavior is normal. Judgment and thought content normal.  Nursing note and vitals reviewed.    ED Treatments / Results  Labs (all labs ordered are listed, but only abnormal results are displayed) Labs Reviewed - No data to display  EKG  EKG Interpretation None       Radiology No results found.  Procedures Procedures (including critical care  time)  Medications Ordered in ED Medications - No data to display   Initial Impression / Assessment and Plan / ED Course  I have reviewed the triage vital signs and the nursing notes.  Pertinent labs & imaging results that were available during my care of the patient were reviewed by me and considered in my medical decision making (see chart for details).  Clinical Course     Final Clinical Impressions(s) / ED Diagnoses   Final diagnoses:  Pain in both lower extremities    Labs:  Imaging:  Consults:  Therapeutics:  Discharge Meds:   Assessment/Plan:   65 year old female presents today with musculoskeletal pain. Patient has no signs of infectious etiology on my exam today. She has chronic wounds to the left that appear to be healing well. Patient does have pain to her right calf, she will receive ultrasound here to rule out DVT as she is bedbound at this point. Patient has no significant findings today other than chronic deconditioning in inability to ambulate. Patient did have physical therapy at home, but no home health care. Face-to-face evaluation ordered for home, patient happy about disposition home and additional help at the house.  DVT study negative patient discharged home.   New Prescriptions New Prescriptions   No medications on file       Eyvonne Mechanic, PA-C 04/07/16 2140    Marily Memos, MD 04/07/16 [phone]

## 2016-04-07 NOTE — ED Notes (Addendum)
[facility] called for transportation  

## 2016-04-07 NOTE — ED Provider Notes (Signed)
[facility]-EMERGENCY DEPT Provider Note   CSN: [id number] Arrival date & time: 04/07/16  2329  By signing my name below, I, Teofilo PodMatthew P. Jamison, attest that this documentation has been prepared under the direction and in the presence of Shon Batonourtney F Edessa Jakubowicz, MD . Electronically Signed: Teofilo PodMatthew P. Jamison, ED Scribe. 04/07/2016. 11:51 PM.    History   Chief Complaint Chief Complaint  Patient presents with  . Requesting SNF placement  . Unable to ambulate    The history is provided by the patient. No language interpreter was used.   HPI Comments:  [name] is a 65 y.o. female with PMHx of asthma, DM and HTN who presents to the Emergency Department complaining of constant bilateral leg pain. Pt is requesting SNF placement. Pt's son states that the pt was given pain medication when she was in the hospital during a recent admission, but she was not sent home with any.  Patient has had difficulty with her ADLs since that time. Upon discharge from the ER earlier today, she was unable to transfer at home. Pt states that she was able to get out of the power chair with the help of a physical therapist on Friday but has gotten worse. Pt's son states that it has been getting increasingly difficult to get the pt out of her chair. Pt denies other associated symptoms.   Patient had a recent admission for leg pain and cellulitis. She was treated and discharged home with home health. She was seen earlier today in the ER for persistent leg pain. She at that time had an ultrasound that was reassuring. However, upon discharge home she was unable to transfer was brought back to the ED. She and her family are requesting SNF placement.  Past Medical History:  Diagnosis Date  . Anxiety   . Arthritis    "back, arms, legs" (03/24/2016)  . Asthma   . Chronic lower back pain   . Colonic polyp    last colonoscopy done in 2009 with normal results per medical record  . Depressive disorder   . Gastric polyp   . Gout     has taken allopurinol 300mg  once daily in past  . Headache   . History of hiatal hernia   . Hypertension   . Migraine    "none in awhile; might have a couple/year" (03/24/2016)  . Mixed hyperlipidemia    01/2016 Total chol 141, HDL 59, LDL 63, ration 1.1  . Osteoarthritis   . TIA (transient ischemic attack) 11/2014  . Type II diabetes mellitus [facility](HCC)     Patient Active Problem List   Diagnosis Date Noted  . Left Lower extremity pain  04/02/2016  . Lower extremity pain, inferior, left 04/02/2016  . Left leg pain   . Personal history of noncompliance with medical treatment, presenting hazards to health 03/31/2016  . Jaw pain 03/27/2016  . Left leg cellulitis 03/24/2016  . Cellulitis of left lower extremity   . Diabetes mellitus with complication (HCC)   . Primary insomnia   . Adjustment disorder   . Morbid obesity (HCC) 01/27/2016  . Wheelchair dependent 01/27/2016  . Gout 01/27/2016  . Asthma 01/27/2016  . Arthritis 01/27/2016  . Uncontrolled diabetes mellitus type 2 with peripheral artery disease (HCC) 01/25/2016  . Cellulitis of left leg 01/11/2016  . DM (diabetes mellitus) (HCC) 01/11/2016  . Hypertension 01/11/2016    Past Surgical History:  Procedure Laterality Date  . BREAST BIOPSY Left ~ 2015   benign  . CARPAL  TUNNEL RELEASE Bilateral   . DILATION AND CURETTAGE OF UTERUS    . KNEE ARTHROSCOPY Right 2003   in [place]  . LAPAROSCOPIC CHOLECYSTECTOMY    . TUBAL LIGATION    . VAGINAL HYSTERECTOMY  1982    OB History    No data available       Home Medications    Prior to Admission medications   Medication Sig Start Date End Date Taking? Authorizing Provider  albuterol (PROVENTIL HFA;VENTOLIN HFA) 108 (90 Base) MCG/ACT inhaler Inhale 2 puffs into the lungs every 6 (six) hours as needed for wheezing or shortness of breath.    Historical Provider, MD  allopurinol (ZYLOPRIM) 300 MG tablet Take 300 mg by mouth daily.    Historical Provider, MD  aspirin 325 MG  tablet Take 325 mg by mouth daily.    Historical Provider, MD  atenolol (TENORMIN) 25 MG tablet Take 25-50 mg by mouth daily. Take 50 mg every morning  Take 25 mg every evening    Historical Provider, MD  clonazePAM (KLONOPIN) 0.5 MG tablet Take 1 tablet (0.5 mg total) by mouth daily as needed for anxiety. 02/25/16   Avanell Shackleton, NP  cyclobenzaprine (FLEXERIL) 10 MG tablet Take 10 mg by mouth 3 (three) times daily as needed for muscle spasms.    Historical Provider, MD  diclofenac sodium (VOLTAREN) 1 % GEL Apply 4 g topically 4 (four) times daily. 02/25/16   Avanell Shackleton, NP  gabapentin (NEURONTIN) 300 MG capsule Take 1 capsule (300 mg total) by mouth 2 (two) times daily. 01/31/16   Avanell Shackleton, NP  hydrocerin (EUCERIN) CREA Apply 1 application topically daily. 01/15/16   Leroy Sea, MD  hydrOXYzine (VISTARIL) 50 MG capsule Take 50 mg by mouth 3 (three) times daily as needed for itching.     Historical Provider, MD  insulin degludec (TRESIBA FLEXTOUCH) 100 UNIT/ML SOPN FlexTouch Pen Inject 0.75 mLs (75 Units total) into the skin daily. 03/14/16   Avanell Shackleton, NP  NIFEdipine (PROCARDIA XL/ADALAT-CC) 90 MG 24 hr tablet Take 90 mg by mouth daily.    Historical Provider, MD    Family History Family History  Problem Relation Age of Onset  . Stroke Father   . Stroke Brother   . Cancer Other     Social History Social History  Substance Use Topics  . Smoking status: Never Smoker  . Smokeless tobacco: Never Used  . Alcohol use No     Allergies   Ace inhibitors and Metformin and related   Review of Systems Review of Systems  Constitutional: Negative for fever.  Respiratory: Negative for shortness of breath.   Cardiovascular: Negative for chest pain.  Musculoskeletal: Positive for gait problem and myalgias.  All other systems reviewed and are negative.    Physical Exam Updated Vital Signs BP 168/95 (BP Location: Left Arm)   Pulse 89   Temp 98.2 F (36.8 C) (Oral)    Resp 16   Ht 5\' 5"  (1.651 m)   Wt (!) 355 lb (161 kg)   SpO2 96%   BMI 59.08 kg/m   Physical Exam  Constitutional: She is oriented to person, place, and time.  Morbidly obese  HENT:  Head: Normocephalic and atraumatic.  Cardiovascular: Normal rate and regular rhythm.   Pulmonary/Chest: Effort normal. No respiratory distress.  Abdominal: Soft. There is no tenderness.  Musculoskeletal:  Bilateral lower extremity swelling and venous stasis changes noted, no significant erythema, 1+ DP pulse bilaterally, left leg  wrapped.  Neurological: She is alert and oriented to person, place, and time.  Skin:  Skin darkening bilateral lower extremity   Psychiatric: She has a normal mood and affect.  Nursing note and vitals reviewed.    ED Treatments / Results  DIAGNOSTIC STUDIES:  Oxygen Saturation is 100% on RA, normal by my interpretation.    COORDINATION OF CARE:  11:52 PM Discussed treatment plan with pt at bedside and pt agreed to plan.   Labs (all labs ordered are listed, but only abnormal results are displayed) Labs Reviewed - No data to display  EKG  EKG Interpretation None       Radiology No results found.  Procedures Procedures (including critical care time)  Medications Ordered in ED Medications  HYDROcodone-acetaminophen (NORCO/VICODIN) 5-325 MG per tablet 1 tablet (not administered)     Initial Impression / Assessment and Plan / ED Course  I have reviewed the triage vital signs and the nursing notes.  Pertinent labs & imaging results that were available during my care of the patient were reviewed by me and considered in my medical decision making (see chart for details).  Clinical Course    Patient presents with persistent leg pain. Difficulty doing ADLs and transferring at home. She is back in the ER because she was unable to transfer into her chair at home. Requesting SNF placement.  Otherwise she appears at her baseline. She was given pain  medication. Will have social work evaluate in the morning for placement.  Final Clinical Impressions(s) / ED Diagnoses   Final diagnoses:  Pain in both lower extremities  Physical deconditioning    New Prescriptions New Prescriptions   No medications on file   I personally performed the services described in this documentation, which was scribed in my presence. The recorded information has been reviewed and is accurate.     Shon Baton, MD 04/07/16 [phone]

## 2016-04-07 NOTE — Progress Notes (Addendum)
**  Preliminary report by tech**  Right lower extremity venous duplex completed. There is no obvious evidence of deep or superficial vein thrombosis involving the right lower extremity. All visualized vessels appear patent and compressible. There is no evidence of a Baker's cyst on the right. Results were given to the patient's nurse, Samara DeistKathryn.  04/07/16 8:01 PM Olen CordialGreg Davonda Ausley RVT

## 2016-04-07 NOTE — Care Management Note (Signed)
Case Management Note  Patient Details  Name: Sharrell Kuunice Cadiente MRN: [id number] Date of Birth: [birth year]  Subjective/Objective:   Patient presents to Ed with bilateral leg weakness and pain               Action/Plan:  Patient active with [facility].  Patient is agreeable to increase home health services at home  [facility]  EDCM also provided patient with contact information for senior resources of [place], choice connections and printed list of private duty nursing agencies.  [facility] with [facility].  Patient thankful for services.  Discussed with EDPA.  Home health orders placed for RN, PT, OT aide and social work. No further EDCM needs at this time.   Expected Discharge Date:                  Expected Discharge Plan:  Home w Home Health Services  In-House Referral:     Discharge planning Services  CM Consult  Post Acute Care Choice:  Home Health Choice offered to:  Patient  DME Arranged:    DME Agency:     HH Arranged:  RN, PT, OT, NA, Social Work [employer] Agency:  [facility]  Status of Service:  Completed, signed off  If discussed at [employer] of [employer] Meetings, dates discussed:    Additional CommentsRadford Pax:  Devontre Siedschlag, RN 04/07/2016, 8:03 PM

## 2016-04-07 NOTE — Progress Notes (Addendum)
[facility] was brought back to the ED for placement.  EDCM contacted sw at this time without success.  EDCM was told that patient's family refuse to take patient home.  EDCM will inform day shift sw via email.  No further EDCM needs at this time.

## 2016-04-07 NOTE — ED Notes (Signed)
Pt to be held in ED overnight for pain control, to be seen by SW in the AM.

## 2016-04-08 LAB — CBG MONITORING, ED
GLUCOSE-CAPILLARY: 177 mg/dL — AB (ref 65–99)
GLUCOSE-CAPILLARY: 221 mg/dL — AB (ref 65–99)
GLUCOSE-CAPILLARY: 260 mg/dL — AB (ref 65–99)
Glucose-Capillary: 180 mg/dL — ABNORMAL HIGH (ref 65–99)

## 2016-04-08 MED ORDER — ASPIRIN 325 MG PO TABS
325.0000 mg | ORAL_TABLET | Freq: Every day | ORAL | Status: DC
  Administered 2016-04-08 – 2016-04-09 (×2): 325 mg via ORAL
  Filled 2016-04-08 (×2): qty 1

## 2016-04-08 MED ORDER — INSULIN ASPART 100 UNIT/ML ~~LOC~~ SOLN
0.0000 [IU] | Freq: Three times a day (TID) | SUBCUTANEOUS | Status: DC
  Administered 2016-04-08: 8 [IU] via SUBCUTANEOUS
  Administered 2016-04-08: 5 [IU] via SUBCUTANEOUS
  Administered 2016-04-09 (×2): 3 [IU] via SUBCUTANEOUS
  Administered 2016-04-09: 5 [IU] via SUBCUTANEOUS
  Filled 2016-04-08 (×4): qty 1

## 2016-04-08 MED ORDER — CLONAZEPAM 0.5 MG PO TABS
0.5000 mg | ORAL_TABLET | Freq: Three times a day (TID) | ORAL | Status: DC | PRN
  Administered 2016-04-08: 0.5 mg via ORAL
  Filled 2016-04-08: qty 1

## 2016-04-08 MED ORDER — ACETAMINOPHEN 325 MG PO TABS
650.0000 mg | ORAL_TABLET | Freq: Once | ORAL | Status: AC
  Administered 2016-04-08: 650 mg via ORAL
  Filled 2016-04-08: qty 2

## 2016-04-08 MED ORDER — ATENOLOL 50 MG PO TABS
50.0000 mg | ORAL_TABLET | Freq: Every day | ORAL | Status: DC
  Administered 2016-04-08 – 2016-04-09 (×2): 50 mg via ORAL
  Filled 2016-04-08 (×2): qty 1

## 2016-04-08 MED ORDER — GABAPENTIN 300 MG PO CAPS
300.0000 mg | ORAL_CAPSULE | Freq: Two times a day (BID) | ORAL | Status: DC
  Administered 2016-04-08 – 2016-04-09 (×3): 300 mg via ORAL
  Filled 2016-04-08 (×3): qty 1

## 2016-04-08 MED ORDER — ATENOLOL 25 MG PO TABS
25.0000 mg | ORAL_TABLET | Freq: Every day | ORAL | Status: DC
  Administered 2016-04-08: 25 mg via ORAL
  Filled 2016-04-08: qty 1

## 2016-04-08 MED ORDER — NIFEDIPINE ER OSMOTIC RELEASE 90 MG PO TB24
90.0000 mg | ORAL_TABLET | Freq: Every day | ORAL | Status: DC
  Administered 2016-04-08 – 2016-04-09 (×2): 90 mg via ORAL
  Filled 2016-04-08 (×2): qty 1

## 2016-04-08 MED ORDER — INSULIN ASPART 100 UNIT/ML ~~LOC~~ SOLN
0.0000 [IU] | Freq: Every day | SUBCUTANEOUS | Status: DC
  Filled 2016-04-08: qty 1

## 2016-04-08 NOTE — Progress Notes (Signed)
9:00am- Attempted to speak with patient, Nurse assisting at the time. Staffed with Nurse.  Staffed case with EDP and he states patient cannot return home.  9:30am- Spoke with patient at bedside with no family present. Patient reports she and her husband Kirby Funkzekial [place] to live with her son Felicity Pellegrinied. Patient reports her son works and he is unable to care for her in the home. Patient reports she has other children and family that live in [place]. Patient reports she is not able to walk or stand. Patient reports she has a lift chair and she is now unable to transfer into this chair. Patient reports she also has a power chair that assisted her transferring to the bed. Patient also reports having a shower chair, bedside commode, and walking cane. Patient reports she had been seen by physical therapy through [facility] in the home and that service has now ended.    Elenore PaddyLaVonia Dudley Cooley, LCSWA [phone] ED CSW 04/08/2016 12:35 PM

## 2016-04-08 NOTE — ED Notes (Signed)
Bed: WA22 Expected date:  Expected time:  Means of arrival:  Comments: Room 7 

## 2016-04-08 NOTE — ED Notes (Signed)
Patient assisted onto bedpan at bedside.  Orange slides and pad placed under her.

## 2016-04-08 NOTE — ED Notes (Signed)
Patient assisted on and off the bed pan by 3 people.

## 2016-04-08 NOTE — ED Notes (Signed)
Pt evaluated by Social Work.

## 2016-04-08 NOTE — NC FL2 (Signed)
[place] MEDICAID FL2 LEVEL OF CARE SCREENING TOOL     IDENTIFICATION  Patient Name: [name] Birthdate: 08-05-1950 Sex: female Admission Date (Current Location): 04/07/2016  [facility] and [place] Number:  Producer, television/film/video and Address:  [facility],  [address] [place] [address]      Provider Number: [id number]  Attending Physician Name and Address:  No att. providers found  Relative Name and Phone Number:       Current Level of Care: Hospital Recommended Level of Care: Skilled Nursing Facility Prior Approval Number:    Date Approved/Denied:   PASRR Number:    Discharge Plan: SNF    Current Diagnoses: Patient Active Problem List   Diagnosis Date Noted  . Left Lower extremity pain  04/02/2016  . Lower extremity pain, inferior, left 04/02/2016  . Left leg pain   . Personal history of noncompliance with medical treatment, presenting hazards to health 03/31/2016  . Jaw pain 03/27/2016  . Left leg cellulitis 03/24/2016  . Cellulitis of left lower extremity   . Diabetes mellitus with complication (HCC)   . Primary insomnia   . Adjustment disorder   . Morbid obesity (HCC) 01/27/2016  . Wheelchair dependent 01/27/2016  . Gout 01/27/2016  . Asthma 01/27/2016  . Arthritis 01/27/2016  . Uncontrolled diabetes mellitus type 2 with peripheral artery disease (HCC) 01/25/2016  . Cellulitis of left leg 01/11/2016  . DM (diabetes mellitus) (HCC) 01/11/2016  . Hypertension 01/11/2016    Orientation RESPIRATION BLADDER Height & Weight     Self, Time, Situation, Place  Normal Continent Weight: (!) 355 lb (161 kg) Height:  5\' 5"  (165.1 cm)  BEHAVIORAL SYMPTOMS/MOOD NEUROLOGICAL BOWEL NUTRITION STATUS      Continent  (Carb modified diet)  AMBULATORY STATUS COMMUNICATION OF NEEDS Skin   Total Care Verbally Normal                       Personal Care Assistance Level of Assistance  Total care       Total Care Assistance: Maximum assistance    Functional Limitations Info  Sight, Hearing, Speech Sight Info: Adequate Hearing Info: Adequate Speech Info: Adequate    SPECIAL CARE FACTORS FREQUENCY   (Diabetic, Hypertension)                    Contractures      Additional Factors Info  Code Status, Allergies Code Status Info:  (Prior) Allergies Info:  (Ace Inhibitors, Metformin and Related)           Current Medications (04/08/2016):  This is the current hospital active medication list Current Facility-Administered Medications  Medication Dose Route Frequency Provider Last Rate Last Dose  . aspirin tablet 325 mg  325 mg Oral Daily Lyndal Pulley, MD   325 mg at 04/08/16 1000  . atenolol (TENORMIN) tablet 25 mg  25 mg Oral QHS Shon Baton, MD      . atenolol (TENORMIN) tablet 50 mg  50 mg Oral Daily Lyndal Pulley, MD   50 mg at 04/08/16 1000  . clonazePAM (KLONOPIN) tablet 0.5 mg  0.5 mg Oral TID PRN Lyndal Pulley, MD      . gabapentin (NEURONTIN) capsule 300 mg  300 mg Oral BID Lyndal Pulley, MD   300 mg at 04/08/16 1000  . insulin aspart (novoLOG) injection 0-15 Units  0-15 Units Subcutaneous TID WC Lyndal Pulley, MD   5 Units at 04/08/16 1234  . insulin aspart (  novoLOG) injection 0-5 Units  0-5 Units Subcutaneous QHS Lyndal Pulleyaniel Knott, MD      . NIFEdipine (PROCARDIA XL/ADALAT-CC) 24 hr tablet 90 mg  90 mg Oral Daily Lyndal Pulleyaniel Knott, MD   90 mg at 04/08/16 1000   Current Outpatient Prescriptions  Medication Sig Dispense Refill  . albuterol (PROVENTIL HFA;VENTOLIN HFA) 108 (90 Base) MCG/ACT inhaler Inhale 2 puffs into the lungs every 6 (six) hours as needed for wheezing or shortness of breath.    . allopurinol (ZYLOPRIM) 300 MG tablet Take 300 mg by mouth daily.    Marland Kitchen. aspirin 325 MG tablet Take 325 mg by mouth daily.    Marland Kitchen. atenolol (TENORMIN) 25 MG tablet Take 25-50 mg by mouth 2 (two) times daily. Take 50 mg every morning and then 25 mg every evening    . bisacodyl (DULCOLAX) 5 MG EC tablet Take 5 mg by mouth daily as  needed for moderate constipation.    . clonazePAM (KLONOPIN) 0.5 MG tablet Take 1 tablet (0.5 mg total) by mouth daily as needed for anxiety. 30 tablet 0  . cyclobenzaprine (FLEXERIL) 10 MG tablet Take 10 mg by mouth 3 (three) times daily as needed for muscle spasms.    . diclofenac sodium (VOLTAREN) 1 % GEL Apply 4 g topically 4 (four) times daily. (Patient taking differently: Apply 4 g topically 4 (four) times daily as needed (pain). ) 1 Tube 1  . gabapentin (NEURONTIN) 300 MG capsule Take 1 capsule (300 mg total) by mouth 2 (two) times daily. 60 capsule 2  . hydrocerin (EUCERIN) CREA Apply 1 application topically daily. 228 g 0  . hydrOXYzine (VISTARIL) 50 MG capsule Take 50 mg by mouth 3 (three) times daily as needed for itching.     . insulin degludec (TRESIBA FLEXTOUCH) 100 UNIT/ML SOPN FlexTouch Pen Inject 0.75 mLs (75 Units total) into the skin daily. (Patient taking differently: Inject 75 Units into the skin every evening. ) 3 mL 1  . Insulin Glulisine (APIDRA SOLOSTAR) 100 UNIT/ML Solostar Pen Inject 15 Units into the skin 2 (two) times daily.    Marland Kitchen. NIFEdipine (PROCARDIA XL/ADALAT-CC) 90 MG 24 hr tablet Take 90 mg by mouth daily.    . polyethylene glycol (MIRALAX / GLYCOLAX) packet Take 17 g by mouth daily as needed for moderate constipation.       Discharge Medications: Please see discharge summary for a list of discharge medications.  Relevant Imaging Results:  Relevant Lab Results:   Additional Information SS# [id number]  Claudean SeveranceLaVonia M Kaylea Mounsey, LCSW

## 2016-04-08 NOTE — ED Notes (Signed)
Patient resting comfortably.  No complaints.  She was informed we would check her BG shortly before noon.

## 2016-04-08 NOTE — ED Notes (Signed)
Patient relocated to room 26.  Pharmacy tech verified her meds.

## 2016-04-08 NOTE — Telephone Encounter (Signed)
[employer] has finally approved [employer] Assistance for [place].  [name] received at our office.  Left message for pt

## 2016-04-08 NOTE — Evaluation (Signed)
Physical Therapy Evaluation Patient Details Name: [name] MRN: [id number] DOB: 11-02-1950 Today's Date: 04/08/2016   History of Present Illness  65 year old lady with past medical history of morbid obesity, chronic back pain, TIA, hypertension, hyperlipidemia, diabetes mellitus, gout, anxiety, who presented to ED with left lower leg pain. In ED 04/03/16, was evaluated by PT and able to transfer.  Returned to ED 10/23/117 for same and DC'd to home and unable to stand so brought back to ED.  Clinical Impression  The patient is complaining of pain in both knees, reports that the pain does not feel like gout. Will get bariatric chair and walker in room  If she moves to a new room. She would benefit from being in a room with a maxisky lift as she is unable to safely stand. Pt admitted with above diagnosis. Pt currently with functional limitations due to the deficits listed below (see PT Problem List). Pt will benefit from skilled PT to increase their independence and safety with mobility to allow discharge to the venue listed below.       Follow Up Recommendations SNF;Supervision/Assistance - 24 hour    Equipment Recommendations  None recommended by PT    Recommendations for Other Services       Precautions / Restrictions Precautions Precautions: Fall Other Brace/Splint: Wrap to L LE      Mobility  Bed Mobility Overal bed mobility: Needs Assistance Bed Mobility: Supine to Sit     Supine to sit: Max assist     General bed mobility comments: extra time, use of bed pad and and rails to mobilize to the bed edge. Pannus was mobilized so the left leg ccould be moved. HOB raised.  Transfers                 General transfer comment: NT, notes report 6 people to get to bed from stretcher.  Ambulation/Gait                Stairs            Wheelchair Mobility    Modified Rankin (Stroke Patients Only)       Balance Overall balance assessment: Needs  assistance Sitting-balance support: Bilateral upper extremity supported Sitting balance-Leahy Scale: Fair                                       Pertinent Vitals/Pain Faces Pain Scale: Hurts whole lot Pain Location: back and  Pain Descriptors / Indicators: Aching;Grimacing;Guarding Pain Intervention(s): Monitored during session    Home Living Family/patient expects to be discharged to:: Private residence Living Arrangements: Spouse/significant other;Children Available Help at Discharge: Family;Available PRN/intermittently Type of Home: Apartment Home Access: Ramped entrance     Home Layout: One level Home Equipment: Wheelchair - manual;Wheelchair - power;Bedside commode Additional Comments: Pt primarily uses the w/c to mobilize.    Prior Function Level of Independence: Needs assistance   Gait / Transfers Assistance Needed: pt only transfers at baseline.    ADL's / Homemaking Assistance Needed: states was able to cook and transfers ubtil last week.   Comments: Pt has reacher/sock aid etc to assist with adls but usually husband just assists her.     Hand Dominance        Extremity/Trunk Assessment   Upper Extremity Assessment: Overall WFL for tasks assessed           Lower Extremity Assessment: RLE deficits/detail;LLE  deficits/detail RLE Deficits / Details: grossly 3+/5 knee extension LLE Deficits / Details: Wrap on L LE due to cellultis and wound, grossly 3/5     Communication      Cognition Arousal/Alertness: Awake/alert Behavior During Therapy: WFL for tasks assessed/performed Overall Cognitive Status: Within Functional Limits for tasks assessed                      General Comments      Exercises     Assessment/Plan    PT Assessment Patient needs continued PT services  PT Problem List Decreased activity tolerance;Decreased balance;Decreased knowledge of use of DME;Obesity          PT Treatment Interventions DME  instruction;Functional mobility training;Therapeutic activities;Therapeutic exercise;Patient/family education    PT Goals (Current goals can be found in the Care Plan section)  Acute Rehab PT Goals Patient Stated Goal: to be able to do for myself. PT Goal Formulation: With patient Time For Goal Achievement: 04/22/16 Potential to Achieve Goals: Fair    Frequency Min 3X/week   Barriers to discharge Decreased caregiver support      Co-evaluation               End of Session   Activity Tolerance: Patient tolerated treatment well Patient left: in bed (sitting on the edge) Nurse Communication: Mobility status    Functional Assessment Tool Used: clinical judgement and objective findings Functional Limitation: Mobility: Walking and moving around Mobility: Walking and Moving Around Current Status (Z6109(G8978): 100 percent impaired, limited or restricted Mobility: Walking and Moving Around Goal Status (U0454(G8979): At least 20 percent but less than 40 percent impaired, limited or restricted    Time: 0756-0822 PT Time Calculation (min) (ACUTE ONLY): 26 min   Charges:   PT Evaluation $PT Eval Low Complexity: 1 Procedure PT Treatments $Therapeutic Activity: 8-22 mins   PT G Codes:   PT G-Codes **NOT FOR INPATIENT CLASS** Functional Assessment Tool Used: clinical judgement and objective findings Functional Limitation: Mobility: Walking and moving around Mobility: Walking and Moving Around Current Status (U9811(G8978): 100 percent impaired, limited or restricted Mobility: Walking and Moving Around Goal Status (B1478(G8979): At least 20 percent but less than 40 percent impaired, limited or restricted    Rada HayHill, Vale Mousseau Elizabeth 04/08/2016, 9:47 AM Blanchard KelchKaren Khallid Pasillas PT [phone]

## 2016-04-08 NOTE — ED Notes (Signed)
All belongings to locker 26.

## 2016-04-08 NOTE — ED Notes (Signed)
Patient moved to hospital bed for comfort. Patient is morbidly obese and was not able to stand and bear weight to pivot to hospital bed from stretcher, required 6 staff to place patient in bed. Assisted to reposition for comfort. Call bell is within reach-all belongings at bedside.

## 2016-04-08 NOTE — ED Notes (Signed)
Bed: [id number] Expected date:  Expected time:  Means of arrival:  Comments: RM 22

## 2016-04-09 LAB — CBG MONITORING, ED
GLUCOSE-CAPILLARY: 186 mg/dL — AB (ref 65–99)
Glucose-Capillary: 209 mg/dL — ABNORMAL HIGH (ref 65–99)

## 2016-04-09 MED ORDER — ACETAMINOPHEN 325 MG PO TABS
650.0000 mg | ORAL_TABLET | Freq: Four times a day (QID) | ORAL | Status: DC | PRN
  Administered 2016-04-09 (×2): 650 mg via ORAL
  Filled 2016-04-09 (×2): qty 2

## 2016-04-09 NOTE — ED Notes (Signed)
[facility] to [facility].  She was A & Ox4.

## 2016-04-09 NOTE — ED Notes (Signed)
Pt having complaints of back pain. No prn orders for pain medication. Dr. Lynelle DoctorKnapp notified of pt's complaints.

## 2016-04-09 NOTE — Progress Notes (Addendum)
Spoke with Clydie BraunKaren at [facility] who states they will accept patient if they have the [facility] authorization. Spoke with Vernona RiegerLaura at [facility] and she stated authorization was approved, [facility] authorization# [id number]. Call back to Clydie BraunKaren and provided the authorization and the PASRR# [id number]. She stated patient could be sent at anytime and number for report [phone] and accepting is Eloisa NorthernSaad Amin, MD. Faxed AVS to [phone]. Provided number for report to Nurse and informed EDP of patient going to [facility].  Patient was given the name of the facility, address, and contact information for her information before leaving the ED.   04/10/16 8:39am- Call from Clydie BraunKaren with [facility] stating they needed patients rug level from [facility]. Spoke with Crystal who stated the rug level is NSG at this time until patient is seen by PT and OT at the SNF. Message left for Clydie BraunKaren with this information at contact number of [phone].   Elenore PaddyLaVonia Marycatherine Maniscalco, LCSWA [phone] ED CSW 04/09/2016 12:19 PM

## 2016-04-09 NOTE — ED Provider Notes (Signed)
Patient requesting medication for her back. She told the nurse she normally takes hydrocodone. However reviewing the [facility] database shows her only controlled prescription was for clonazepam 0.5 mg tablets dispense 30 on September 11 by Hetty BlendVickie Henson in the past 6 months. She was prescribed acetaminophen on a regular basis.    Devoria AlbeIva Aaleyah Witherow, MD 04/09/16 [phone]

## 2016-04-09 NOTE — ED Provider Notes (Addendum)
The patient only needs a 30 day or less stay in skilled nursing facility  [facility]   [name] HongBrian Latania Bascomb, MD 04/09/16 [id number]    [name] HongBrian Juan Olthoff, MD 04/09/16 [phone]

## 2016-04-09 NOTE — ED Notes (Signed)
Report given to MauryDee, LPN

## 2016-04-09 NOTE — Progress Notes (Signed)
Call received from Vern[facility]Laura at [facility] stating their MD would need to do a peer to peer with the EDP to assist with authorization.  [name], LCSWA [phone] ED CSW 04/09/2016 10:48 AM

## 2016-04-09 NOTE — Discharge Instructions (Signed)
Return to ER for severe or worsening symptoms.

## 2016-04-09 NOTE — Progress Notes (Addendum)
CSW currently submitting more information for PASRR# and [facility] authorization to assist with placement in a facility.  [facility] and she stated patients authorization is still pending. Information also faxed again to [facility].   Elenore PaddyLaVonia Shawntrice Salle, LCSWA [phone] ED CSW 04/09/2016 9:14 AM

## 2016-04-10 ENCOUNTER — Inpatient Hospital Stay: Payer: Medicare PPO | Admitting: Family Medicine

## 2016-05-02 ENCOUNTER — Telehealth: Payer: Self-pay | Admitting: Family Medicine

## 2016-05-02 NOTE — Telephone Encounter (Signed)
[facility] called asking for verbal approval to start resumption of care services on pt on Monday. Pt was released from a facility on Tues however [facility] not been able to reach the pt until today. Call [place] back at [phone]

## 2016-05-02 NOTE — Telephone Encounter (Signed)
Left message on cell okay to restart per jcl

## 2016-05-02 NOTE — Telephone Encounter (Signed)
Ok

## 2016-05-06 ENCOUNTER — Telehealth: Payer: Self-pay | Admitting: Internal Medicine

## 2016-05-06 NOTE — Telephone Encounter (Signed)
Marchelle Folksmanda with [facility] called to find out if [name] would sign off on plan of care until patient goes for a new pcp on December 21st. [name] agreed to sign orders until her appt. No order for HOSPITAL BED WILL BE WRITTEN OR APPROVED BY [name].

## 2016-05-19 DIAGNOSIS — M5431 Sciatica, right side: Secondary | ICD-10-CM | POA: Insufficient documentation

## 2016-05-19 DIAGNOSIS — M62838 Other muscle spasm: Secondary | ICD-10-CM | POA: Insufficient documentation

## 2016-05-19 DIAGNOSIS — G629 Polyneuropathy, unspecified: Secondary | ICD-10-CM | POA: Insufficient documentation

## 2016-05-19 DIAGNOSIS — E119 Type 2 diabetes mellitus without complications: Secondary | ICD-10-CM | POA: Insufficient documentation

## 2016-05-20 ENCOUNTER — Encounter (HOSPITAL_COMMUNITY): Payer: Self-pay | Admitting: Emergency Medicine

## 2016-05-20 ENCOUNTER — Encounter (HOSPITAL_BASED_OUTPATIENT_CLINIC_OR_DEPARTMENT_OTHER): Payer: Medicare PPO

## 2016-05-20 ENCOUNTER — Emergency Department (HOSPITAL_COMMUNITY)
Admission: EM | Admit: 2016-05-20 | Discharge: 2016-05-20 | Disposition: A | Discharge status: Home / Self Care | Payer: Medicare PPO | Attending: Physician Assistant | Admitting: Physician Assistant

## 2016-05-20 DIAGNOSIS — J45909 Unspecified asthma, uncomplicated: Secondary | ICD-10-CM | POA: Diagnosis not present

## 2016-05-20 DIAGNOSIS — Z5189 Encounter for other specified aftercare: Secondary | ICD-10-CM

## 2016-05-20 DIAGNOSIS — I872 Venous insufficiency (chronic) (peripheral): Secondary | ICD-10-CM

## 2016-05-20 DIAGNOSIS — Z794 Long term (current) use of insulin: Secondary | ICD-10-CM | POA: Diagnosis not present

## 2016-05-20 DIAGNOSIS — I10 Essential (primary) hypertension: Secondary | ICD-10-CM | POA: Insufficient documentation

## 2016-05-20 DIAGNOSIS — M7989 Other specified soft tissue disorders: Secondary | ICD-10-CM | POA: Diagnosis present

## 2016-05-20 DIAGNOSIS — E119 Type 2 diabetes mellitus without complications: Secondary | ICD-10-CM | POA: Insufficient documentation

## 2016-05-20 DIAGNOSIS — Z79899 Other long term (current) drug therapy: Secondary | ICD-10-CM | POA: Diagnosis not present

## 2016-05-20 DIAGNOSIS — Z8673 Personal history of transient ischemic attack (TIA), and cerebral infarction without residual deficits: Secondary | ICD-10-CM | POA: Diagnosis not present

## 2016-05-20 DIAGNOSIS — Z7982 Long term (current) use of aspirin: Secondary | ICD-10-CM | POA: Insufficient documentation

## 2016-05-20 DIAGNOSIS — Z4800 Encounter for change or removal of nonsurgical wound dressing: Secondary | ICD-10-CM | POA: Diagnosis not present

## 2016-05-20 MED ORDER — DOXYCYCLINE HYCLATE 100 MG PO CAPS: 100.0000 mg | ORAL_CAPSULE | Freq: Two times a day (BID) | ORAL | 0 refills | Status: DC

## 2016-05-20 NOTE — ED Triage Notes (Signed)
Pt here with cellulitis to LLE; pt seen at wound center in past for same but unable to get an appointment today

## 2016-05-20 NOTE — ED Notes (Addendum)
Pt is presenting to the ED with a wound on her left lower leg. Pt says the site does not hurt.  To this RN the site appears to have eschar on the inner lower left calf, size is estimated 5cm by 4cm, minimal drainage, and has a foul smell.

## 2016-05-20 NOTE — Discharge Instructions (Signed)
Keep wounds clean and dry. Apply warm compresses to affected area throughout the day. Take antibiotic until it is finished. Alternate between ibuprofen and tylenol as needed for pain. [facility] in [facility]-3 days for wound recheck, and call the wound center tomorrow to schedule an appointment for ongoing wound care as soon as possible. Elevate your legs to help with swelling. Monitor area for signs of infection to include, but not limited to: increasing pain, spreading redness, drainage/pus, worsening swelling, or fevers. Return to emergency department for emergent changing or worsening symptoms.

## 2016-05-20 NOTE — ED Notes (Signed)
Pt verbalized understanding discharge instructions and denies any further needs or questions at this time. VS stable 

## 2016-05-20 NOTE — ED Provider Notes (Signed)
[facility]-EM[facility]Y DEPT Provider Note   CSN: [id number] Arrival date & time: 05/20/16  1349     History   Chief Complaint Chief Complaint  Patient presents with  . Wound Check    HPI [name] is a 65 y.o. female with a PMHx of DM2, HTN, HLD, wheelchair dependency, arthritis, chronic back pain, gout, asthma, migraines, TIA, venous insufficiency/stasis dermatitis, and recurrent LLE cellulitis with multiple admissions this year (July, August, twice in October), who presents to the ED requesting wound check for her LLE wounds. Since states that she missed her appointment at the wound care center this morning, so she came here for evaluation of her left leg wounds. Patient states that her left lower extremity skin "opened up a few days ago" and started weeping a serosanguineous malodorous material so she wanted to be evaluated for possible cellulitis. She states that she has chronic leg swelling in both legs, but recently the left lower leg has been slightly worse than the right, which improves with elevation of her legs and seems somewhat intermittent. She has not tried anything for her symptoms aside from placing bandages on her legs. Patient states that she hasn't seen the wound care center since early in October because from late October until 04/29/16 [facility], states that while she was in the nursing home her legs did not have any skin openings/weepage so no wound care was done while she was there. It wasn't until several days ago that her legs "opened up" again.   Chart review reveals she was admitted 03/24/16 for LLE cellulitis treated with doxycycline, then again admitted on 04/02/16 for chronic venous stasis with ulceration, and was supposed to follow with the wound care clinic for it. Had LLE DVT U/S on 04/07/16 which was negative. Had ABIs done in July 2017 which were essentially normal.   She denies any pain in her legs, erythema or warmth, or any other  complaints regarding her leg wounds. She denies any fevers, chills, CP, SOB, abd pain, N/V/D/C, hematuria, dysuria, myalgias, arthralgias, new/changing numbness/tingling, focal weakness, or any other complaints at this time.    The history is provided by the patient and medical records. No language interpreter was used.  Wound Check  This is a chronic problem. The current episode started more than 2 days ago. The problem occurs constantly. The problem has not changed since onset.Pertinent negatives include no chest pain, no abdominal pain and no shortness of breath. Nothing aggravates the symptoms. Nothing relieves the symptoms. Treatments tried: elevation. The treatment provided mild relief.    Past Medical History:  Diagnosis Date  . Anxiety   . Arthritis    "back, arms, legs" (03/24/2016)  . Asthma   . Chronic lower back pain   . Colonic polyp    last colonoscopy done in 2009 with normal results per medical record  . Depressive disorder   . Gastric polyp   . Gout    has taken allopurinol 300mg  once daily in past  . Headache   . History of hiatal hernia   . Hypertension   . Migraine    "none in awhile; might have a couple/year" (03/24/2016)  . Mixed hyperlipidemia    01/2016 Total chol 141, HDL 59, LDL 63, ration 1.1  . Osteoarthritis   . TIA (transient ischemic attack) 11/2014  . Type II diabetes mellitus [facility](HCC)     Patient Active Problem List   Diagnosis Date Noted  . Left Lower extremity pain  04/02/2016  . Lower extremity pain, inferior, left 04/02/2016  . Left leg pain   . Personal history of noncompliance with medical treatment, presenting hazards to health 03/31/2016  . Jaw pain 03/27/2016  . Left leg cellulitis 03/24/2016  . Cellulitis of left lower extremity   . Diabetes mellitus with complication (HCC)   . Primary insomnia   . Adjustment disorder   . Morbid obesity (HCC) 01/27/2016  . Wheelchair dependent 01/27/2016  . Gout 01/27/2016  . Asthma 01/27/2016  .  Arthritis 01/27/2016  . Uncontrolled diabetes mellitus type 2 with peripheral artery disease (HCC) 01/25/2016  . Cellulitis of left leg 01/11/2016  . DM (diabetes mellitus) (HCC) 01/11/2016  . Hypertension 01/11/2016    Past Surgical History:  Procedure Laterality Date  . BREAST BIOPSY Left ~ 2015   benign  . CARPAL TUNNEL RELEASE Bilateral   . DILATION AND CURETTAGE OF UTERUS    . KNEE ARTHROSCOPY Right 2003   in [place]  . LAPAROSCOPIC CHOLECYSTECTOMY    . TUBAL LIGATION    . VAGINAL HYSTERECTOMY  1982    OB History    No data available       Home Medications    Prior to Admission medications   Medication Sig Start Date End Date Taking? Authorizing Provider  albuterol (PROVENTIL HFA;VENTOLIN HFA) 108 (90 Base) MCG/ACT inhaler Inhale 2 puffs into the lungs every 6 (six) hours as needed for wheezing or shortness of breath.    Historical Provider, MD  allopurinol (ZYLOPRIM) 300 MG tablet Take 300 mg by mouth daily.    Historical Provider, MD  aspirin 325 MG tablet Take 325 mg by mouth daily.    Historical Provider, MD  atenolol (TENORMIN) 25 MG tablet Take 25-50 mg by mouth 2 (two) times daily. Take 50 mg every morning and then 25 mg every evening    Historical Provider, MD  bisacodyl (DULCOLAX) 5 MG EC tablet Take 5 mg by mouth daily as needed for moderate constipation.    Historical Provider, MD  clonazePAM (KLONOPIN) 0.5 MG tablet Take 1 tablet (0.5 mg total) by mouth daily as needed for anxiety. 02/25/16   Avanell ShackletonVickie L Henson, NP  cyclobenzaprine (FLEXERIL) 10 MG tablet Take 10 mg by mouth 3 (three) times daily as needed for muscle spasms.    Historical Provider, MD  diclofenac sodium (VOLTAREN) 1 % GEL Apply 4 g topically 4 (four) times daily. Patient taking differently: Apply 4 g topically 4 (four) times daily as needed (pain).  02/25/16   Avanell ShackletonVickie L Henson, NP  gabapentin (NEURONTIN) 300 MG capsule Take 1 capsule (300 mg total) by mouth 2 (two) times daily. 01/31/16   Avanell ShackletonVickie L Henson,  NP  hydrocerin (EUCERIN) CREA Apply 1 application topically daily. 01/15/16   Leroy SeaPrashant K Singh, MD  hydrOXYzine (VISTARIL) 50 MG capsule Take 50 mg by mouth 3 (three) times daily as needed for itching.     Historical Provider, MD  insulin degludec (TRESIBA FLEXTOUCH) 100 UNIT/ML SOPN FlexTouch Pen Inject 0.75 mLs (75 Units total) into the skin daily. Patient taking differently: Inject 75 Units into the skin every evening.  03/14/16   Avanell ShackletonVickie L Henson, NP  Insulin Glulisine (APIDRA SOLOSTAR) 100 UNIT/ML Solostar Pen Inject 15 Units into the skin 2 (two) times daily.    Historical Provider, MD  NIFEdipine (PROCARDIA XL/ADALAT-CC) 90 MG 24 hr tablet Take 90 mg by mouth daily.    Historical Provider, MD  polyethylene glycol (MIRALAX / GLYCOLAX) packet Take 17 g by  mouth daily as needed for moderate constipation.    Historical Provider, MD    Family History Family History  Problem Relation Age of Onset  . Stroke Father   . Stroke Brother   . Cancer Other     Social History Social History  Substance Use Topics  . Smoking status: Never Smoker  . Smokeless tobacco: Never Used  . Alcohol use No     Allergies   Ace inhibitors and Metformin and related   Review of Systems Review of Systems  Constitutional: Negative for chills and fever.  Respiratory: Negative for shortness of breath.   Cardiovascular: Positive for leg swelling (chronic to bilateral legs, LLE slightly worse recently). Negative for chest pain.  Gastrointestinal: Negative for abdominal pain, constipation, diarrhea, nausea and vomiting.  Genitourinary: Negative for dysuria and hematuria.  Musculoskeletal: Negative for arthralgias and myalgias.  Skin: Positive for wound (LLE skin openings). Negative for color change.  Allergic/Immunologic: Positive for immunocompromised state (DM2).  Neurological: Negative for weakness and numbness.  Psychiatric/Behavioral: Negative for confusion.   10 Systems reviewed and are negative for  acute change except as noted in the HPI.   Physical Exam Updated Vital Signs BP 127/96 (BP Location: Right Arm)   Pulse 84   Temp 98.1 F (36.7 C) (Oral)   Resp 18   SpO2 99%   Physical Exam  Constitutional: She is oriented to person, place, and time. Vital signs are normal. She appears well-developed and well-nourished.  Non-toxic appearance. No distress.  Afebrile, nontoxic, NAD, morbidly obese  HENT:  Head: Normocephalic and atraumatic.  Mouth/Throat: Oropharynx is clear and moist and mucous membranes are normal.  Eyes: Conjunctivae and EOM are normal. Right eye exhibits no discharge. Left eye exhibits no discharge.  Neck: Normal range of motion. Neck supple.  Cardiovascular: Normal rate, regular rhythm, normal heart sounds and intact distal pulses.  Exam reveals no gallop and no friction rub.   No murmur heard. Pulmonary/Chest: Effort normal and breath sounds normal. No respiratory distress. She has no decreased breath sounds. She has no wheezes. She has no rhonchi. She has no rales.  Abdominal: Soft. Normal appearance and bowel sounds are normal. She exhibits no distension. There is no tenderness. There is no rigidity, no rebound, no guarding, no CVA tenderness, no tenderness at McBurney's point and negative Murphy's sign.  Musculoskeletal: Normal range of motion.  Venous insufficiency changes to both lower legs, with brawny darkened skin from mid-calf to feet, RLE with 2+ pedal edema, LLE with 3-4+ pedal edema, neg homan's bilaterally. Very small ulcerations/openings in skin on LLE near the malleoli and to dorsum of foot, scant serous fluid drainage, mildly erythematous and trace warmth to touch near the small skin ulcerations. Nontender on palpation. No red streaking. FROM intact in all major joints of lower extremities. SEE PICTURE BELOW MAE x4 Strength and sensation grossly intact in all extremities Distal pulses intact  Neurological: She is alert and oriented to person, place,  and time. She has normal strength. No sensory deficit.  Skin: Skin is warm, dry and intact. No rash noted. There is erythema.  Venous stasis dermatitis to LLE as mentioned above and pictured below; chronic venous insufficiency skin changes to BLE as mentioned above and pictured below  Psychiatric: She has a normal mood and affect.  Nursing note and vitals reviewed.         ED Treatments / Results  Labs (all labs ordered are listed, but only abnormal results are displayed) Labs Reviewed -  No data to display  EKG  EKG Interpretation None       Radiology No results found.  Duplex Venous U/S LLE 04/07/16 Summary: - No evidence of deep vein or superficial thrombosis involving the   right lower extremity and left common femoral vein. - No evidence of Baker&'s cyst on the right. Other specific details can be found in the table(s) above. Prepared and Electronically Authenticated by Charlena Cross, MD 2017-10-24T15:47:35  Procedures Procedures (including critical care time)  Medications Ordered in ED Medications - No data to display   Initial Impression / Assessment and Plan / ED Course  I have reviewed the triage vital signs and the nursing notes.  Pertinent labs & imaging results that were available during my care of the patient were reviewed by me and considered in my medical decision making (see chart for details).  Clinical Course     65 y.o. female here with chronic venous stasis dermatitis which has opened up and started weeping again a few days ago. Sees wound center, but hasn't seen them since October because she was in [facility] during the end of October until 04/29/16, and missed her appointment this morning. Has noticed more swelling in the LLE, bloody and serosanguinous weeping, and ulcerations from stasis dermatitis. Afebrile and without systemic symptoms; on exam, b/l legs with venous insufficiency changes of brawny darkened skin, 2+ pedal  edema on RLE and 3-4+ edema on LLE, mildly erythematous LLE with very trace warmth to touch, nontender on palpation, with small ulcerations to medial and lateral aspects of distal leg which are draining some serous fluid. Has been admitted multiple times since July for LLE cellulitis/stasis dermatitis, has been treated with doxycycline last month but also just with wound care after her most recent admission 04/02/16. Overall, seems like it's mostly just stasis dermatitis but could be early cellulitis. Has had neg DVT study 04/07/16 and ABIs that were essentially unremarkable in July 2017; doubt need for repeating either of these studies. Doubt need for labs/further work up. NVI with soft compartments, great DP pulses palpated. Will send home with doxy, discussed elevation of legs, wound care, and f/up with PCP in 2-3 days for recheck. Call wound center tomorrow to reschedule appt ASAP. I explained the diagnosis and have given explicit precautions to return to the ER including for any other new or worsening symptoms. The patient understands and accepts the medical plan as it's been dictated and I have answered their questions. Discharge instructions concerning home care and prescriptions have been given. The patient is STABLE and is discharged to home in good condition.   Final Clinical Impressions(s) / ED Diagnoses   Final diagnoses:  Visit for wound check  Venous stasis dermatitis of left lower extremity    New Prescriptions New Prescriptions   DOXYCYCLINE (VIBRAMYCIN) 100 MG CAPSULE    Take 1 capsule (100 mg total) by mouth 2 (two) times daily. One po bid x 7 days     Hayle Parisi Camprubi-Soms, PA-C 05/20/16 1708    Courteney Lyn Mackuen, MD 05/22/16 1620

## 2016-05-20 NOTE — ED Notes (Signed)
Pt informed of wait time. 

## 2016-05-22 ENCOUNTER — Telehealth: Payer: Self-pay

## 2016-05-22 NOTE — Telephone Encounter (Signed)
Records from [facility] provided to pt for transfer of care. LM on VCM that records at front desk for pick up. Trixie Rude/RLB

## 2016-05-27 ENCOUNTER — Ambulatory Visit: Payer: Medicare PPO | Admitting: Family Medicine

## 2016-05-28 ENCOUNTER — Encounter (HOSPITAL_BASED_OUTPATIENT_CLINIC_OR_DEPARTMENT_OTHER): Payer: Medicare PPO | Attending: Surgery

## 2016-05-29 ENCOUNTER — Encounter: Payer: Self-pay | Admitting: Vascular Surgery

## 2016-05-30 ENCOUNTER — Emergency Department (HOSPITAL_COMMUNITY): Payer: Medicare PPO

## 2016-05-30 ENCOUNTER — Encounter (HOSPITAL_COMMUNITY): Payer: Self-pay | Admitting: Emergency Medicine

## 2016-05-30 ENCOUNTER — Observation Stay (HOSPITAL_COMMUNITY)
Admission: EM | Admit: 2016-05-30 | Discharge: 2016-06-01 | Disposition: A | Discharge status: Skilled Nursing Facility | Payer: Medicare PPO | Attending: Internal Medicine | Admitting: Internal Medicine

## 2016-05-30 DIAGNOSIS — Z888 Allergy status to other drugs, medicaments and biological substances status: Secondary | ICD-10-CM | POA: Insufficient documentation

## 2016-05-30 DIAGNOSIS — Z8719 Personal history of other diseases of the digestive system: Secondary | ICD-10-CM | POA: Insufficient documentation

## 2016-05-30 DIAGNOSIS — F4320 Adjustment disorder, unspecified: Secondary | ICD-10-CM | POA: Insufficient documentation

## 2016-05-30 DIAGNOSIS — M25561 Pain in right knee: Secondary | ICD-10-CM | POA: Diagnosis not present

## 2016-05-30 DIAGNOSIS — R0789 Other chest pain: Secondary | ICD-10-CM | POA: Diagnosis not present

## 2016-05-30 DIAGNOSIS — M7989 Other specified soft tissue disorders: Secondary | ICD-10-CM | POA: Diagnosis not present

## 2016-05-30 DIAGNOSIS — Z794 Long term (current) use of insulin: Secondary | ICD-10-CM | POA: Diagnosis not present

## 2016-05-30 DIAGNOSIS — Z7982 Long term (current) use of aspirin: Secondary | ICD-10-CM | POA: Diagnosis not present

## 2016-05-30 DIAGNOSIS — Z6841 Body Mass Index (BMI) 40.0 and over, adult: Secondary | ICD-10-CM | POA: Insufficient documentation

## 2016-05-30 DIAGNOSIS — F419 Anxiety disorder, unspecified: Secondary | ICD-10-CM | POA: Diagnosis present

## 2016-05-30 DIAGNOSIS — I493 Ventricular premature depolarization: Secondary | ICD-10-CM | POA: Diagnosis not present

## 2016-05-30 DIAGNOSIS — L97909 Non-pressure chronic ulcer of unspecified part of unspecified lower leg with unspecified severity: Secondary | ICD-10-CM | POA: Diagnosis present

## 2016-05-30 DIAGNOSIS — R29898 Other symptoms and signs involving the musculoskeletal system: Secondary | ICD-10-CM | POA: Diagnosis not present

## 2016-05-30 DIAGNOSIS — E782 Mixed hyperlipidemia: Secondary | ICD-10-CM | POA: Insufficient documentation

## 2016-05-30 DIAGNOSIS — F418 Other specified anxiety disorders: Secondary | ICD-10-CM | POA: Diagnosis present

## 2016-05-30 DIAGNOSIS — E1140 Type 2 diabetes mellitus with diabetic neuropathy, unspecified: Secondary | ICD-10-CM | POA: Insufficient documentation

## 2016-05-30 DIAGNOSIS — J45909 Unspecified asthma, uncomplicated: Secondary | ICD-10-CM | POA: Insufficient documentation

## 2016-05-30 DIAGNOSIS — Z86010 Personal history of colonic polyps: Secondary | ICD-10-CM | POA: Insufficient documentation

## 2016-05-30 DIAGNOSIS — M1389 Other specified arthritis, multiple sites: Secondary | ICD-10-CM | POA: Insufficient documentation

## 2016-05-30 DIAGNOSIS — I10 Essential (primary) hypertension: Secondary | ICD-10-CM | POA: Diagnosis not present

## 2016-05-30 DIAGNOSIS — S81802D Unspecified open wound, left lower leg, subsequent encounter: Secondary | ICD-10-CM

## 2016-05-30 DIAGNOSIS — E1165 Type 2 diabetes mellitus with hyperglycemia: Secondary | ICD-10-CM | POA: Insufficient documentation

## 2016-05-30 DIAGNOSIS — M109 Gout, unspecified: Secondary | ICD-10-CM | POA: Insufficient documentation

## 2016-05-30 DIAGNOSIS — M79642 Pain in left hand: Secondary | ICD-10-CM | POA: Insufficient documentation

## 2016-05-30 DIAGNOSIS — F5101 Primary insomnia: Secondary | ICD-10-CM | POA: Insufficient documentation

## 2016-05-30 DIAGNOSIS — E119 Type 2 diabetes mellitus without complications: Secondary | ICD-10-CM

## 2016-05-30 DIAGNOSIS — G8929 Other chronic pain: Secondary | ICD-10-CM | POA: Diagnosis not present

## 2016-05-30 DIAGNOSIS — R531 Weakness: Principal | ICD-10-CM

## 2016-05-30 DIAGNOSIS — Z8673 Personal history of transient ischemic attack (TIA), and cerebral infarction without residual deficits: Secondary | ICD-10-CM | POA: Insufficient documentation

## 2016-05-30 DIAGNOSIS — L03116 Cellulitis of left lower limb: Secondary | ICD-10-CM | POA: Diagnosis not present

## 2016-05-30 DIAGNOSIS — I7389 Other specified peripheral vascular diseases: Secondary | ICD-10-CM | POA: Diagnosis not present

## 2016-05-30 DIAGNOSIS — Z90710 Acquired absence of both cervix and uterus: Secondary | ICD-10-CM | POA: Insufficient documentation

## 2016-05-30 DIAGNOSIS — F32A Depression, unspecified: Secondary | ICD-10-CM | POA: Diagnosis present

## 2016-05-30 DIAGNOSIS — R52 Pain, unspecified: Secondary | ICD-10-CM

## 2016-05-30 LAB — URINALYSIS, ROUTINE W REFLEX MICROSCOPIC
BILIRUBIN URINE: NEGATIVE
Glucose, UA: NEGATIVE mg/dL
Hgb urine dipstick: NEGATIVE
KETONES UR: NEGATIVE mg/dL
LEUKOCYTES UA: NEGATIVE
NITRITE: NEGATIVE
PROTEIN: NEGATIVE mg/dL
Specific Gravity, Urine: 1.012 (ref 1.005–1.030)
pH: 5 (ref 5.0–8.0)

## 2016-05-30 LAB — BASIC METABOLIC PANEL
ANION GAP: 11 (ref 5–15)
BUN: 10 mg/dL (ref 6–20)
CALCIUM: 9.9 mg/dL (ref 8.9–10.3)
CO2: 24 mmol/L (ref 22–32)
Chloride: 105 mmol/L (ref 101–111)
Creatinine, Ser: 0.86 mg/dL (ref 0.44–1.00)
GFR calc Af Amer: >60 mL/min (ref >60)
GLUCOSE: 74 mg/dL (ref 65–99)
POTASSIUM: 3.7 mmol/L (ref 3.5–5.1)
SODIUM: 140 mmol/L (ref 135–145)

## 2016-05-30 LAB — CBC
HCT: 40.9 % (ref 36.0–46.0)
HEMOGLOBIN: 13.3 g/dL (ref 12.0–15.0)
MCH: 29.2 pg (ref 26.0–34.0)
MCHC: 32.5 g/dL (ref 30.0–36.0)
MCV: 89.9 fL (ref 78.0–100.0)
Platelets: 358 10*3/uL (ref 150–400)
RBC: 4.55 MIL/uL (ref 3.87–5.11)
RDW: 15.2 % (ref 11.5–15.5)
WBC: 10.6 10*3/uL — AB (ref 4.0–10.5)

## 2016-05-30 MED ORDER — NIFEDIPINE ER OSMOTIC RELEASE 90 MG PO TB24
90.0000 mg | ORAL_TABLET | Freq: Every day | ORAL | Status: DC
  Administered 2016-05-31 – 2016-06-01 (×2): 90 mg via ORAL
  Filled 2016-05-30 (×2): qty 1

## 2016-05-30 MED ORDER — ALBUTEROL SULFATE (2.5 MG/3ML) 0.083% IN NEBU: 3.0000 mL | INHALATION_SOLUTION | Freq: Four times a day (QID) | RESPIRATORY_TRACT | Status: DC | PRN

## 2016-05-30 MED ORDER — INSULIN ASPART 100 UNIT/ML ~~LOC~~ SOLN: 0.0000 [IU] | Freq: Every day | SUBCUTANEOUS | Status: DC

## 2016-05-30 MED ORDER — ACETAMINOPHEN 325 MG PO TABS
650.0000 mg | ORAL_TABLET | ORAL | Status: DC | PRN
  Administered 2016-05-31 – 2016-06-01 (×2): 650 mg via ORAL
  Filled 2016-05-30 (×2): qty 2

## 2016-05-30 MED ORDER — ACETAMINOPHEN 650 MG RE SUPP: 650.0000 mg | RECTAL | Status: DC | PRN

## 2016-05-30 MED ORDER — INSULIN ASPART 100 UNIT/ML ~~LOC~~ SOLN
0.0000 [IU] | Freq: Three times a day (TID) | SUBCUTANEOUS | Status: DC
Start: 2016-05-31 — End: 2016-06-01
  Administered 2016-05-31: 2 [IU] via SUBCUTANEOUS
  Administered 2016-05-31 – 2016-06-01 (×3): 1 [IU] via SUBCUTANEOUS

## 2016-05-30 MED ORDER — ATENOLOL 25 MG PO TABS
50.0000 mg | ORAL_TABLET | Freq: Every day | ORAL | Status: DC
  Administered 2016-05-31 – 2016-06-01 (×2): 50 mg via ORAL
  Filled 2016-05-30 (×2): qty 2

## 2016-05-30 MED ORDER — DICLOFENAC SODIUM 1 % TD GEL: 4.0000 g | Freq: Four times a day (QID) | TRANSDERMAL | Status: DC | PRN

## 2016-05-30 MED ORDER — SENNOSIDES-DOCUSATE SODIUM 8.6-50 MG PO TABS
1.0000 | ORAL_TABLET | Freq: Every evening | ORAL | Status: DC | PRN
  Filled 2016-05-30: qty 1

## 2016-05-30 MED ORDER — ENOXAPARIN SODIUM 40 MG/0.4ML ~~LOC~~ SOLN
40.0000 mg | SUBCUTANEOUS | Status: DC
  Administered 2016-05-31 – 2016-06-01 (×2): 40 mg via SUBCUTANEOUS
  Filled 2016-05-30 (×2): qty 0.4

## 2016-05-30 MED ORDER — ASPIRIN 325 MG PO TABS
325.0000 mg | ORAL_TABLET | Freq: Every day | ORAL | Status: DC
  Administered 2016-05-31 – 2016-06-01 (×2): 325 mg via ORAL
  Filled 2016-05-30 (×2): qty 1

## 2016-05-30 MED ORDER — CLONAZEPAM 0.5 MG PO TABS
0.5000 mg | ORAL_TABLET | Freq: Two times a day (BID) | ORAL | Status: DC | PRN
  Administered 2016-05-31 (×2): 0.5 mg via ORAL
  Filled 2016-05-30 (×2): qty 1

## 2016-05-30 MED ORDER — GABAPENTIN 300 MG PO CAPS
300.0000 mg | ORAL_CAPSULE | Freq: Two times a day (BID) | ORAL | Status: DC
  Administered 2016-05-31 – 2016-06-01 (×4): 300 mg via ORAL
  Filled 2016-05-30 (×4): qty 1

## 2016-05-30 MED ORDER — ATENOLOL 25 MG PO TABS
25.0000 mg | ORAL_TABLET | Freq: Every day | ORAL | Status: DC
  Administered 2016-05-31 (×2): 25 mg via ORAL
  Filled 2016-05-30 (×2): qty 1

## 2016-05-30 MED ORDER — INSULIN GLARGINE 100 UNIT/ML ~~LOC~~ SOLN: 70.0000 [IU] | Freq: Every day | SUBCUTANEOUS | Status: DC

## 2016-05-30 MED ORDER — ALLOPURINOL 100 MG PO TABS
300.0000 mg | ORAL_TABLET | Freq: Every day | ORAL | Status: DC
  Administered 2016-05-31 – 2016-06-01 (×2): 300 mg via ORAL
  Filled 2016-05-30 (×2): qty 3

## 2016-05-30 MED ORDER — ACETAMINOPHEN 160 MG/5ML PO SOLN: 650.0000 mg | ORAL | Status: DC | PRN

## 2016-05-30 MED ORDER — CYCLOBENZAPRINE HCL 10 MG PO TABS
10.0000 mg | ORAL_TABLET | Freq: Three times a day (TID) | ORAL | Status: DC | PRN
  Administered 2016-05-31 (×2): 10 mg via ORAL
  Filled 2016-05-30 (×2): qty 1

## 2016-05-30 MED ORDER — INSULIN ASPART 100 UNIT/ML ~~LOC~~ SOLN
3.0000 [IU] | Freq: Three times a day (TID) | SUBCUTANEOUS | Status: DC
  Administered 2016-05-31 – 2016-06-01 (×4): 3 [IU] via SUBCUTANEOUS

## 2016-05-30 NOTE — ED Provider Notes (Signed)
[facility]-EMERGENCY DEPT Provider Note   CSN: [id number] Arrival date & time: 05/30/16 1703     History    Chief Complaint  Patient presents with  . Weakness     HPI [name] is a 65 y.o. female.  65yo F w/ PMH including HTN, HLD, T2DM, TIA, gout who p/w weakness. Patient reports progressively worsening weakness over the past 3 weeks. Recently she has been laying in bed all day because she has difficulty performing basic tasks such as getting up to go to the bathroom and dressings/bathing herself. She has noticed 2 weeks of intermittent left arm weakness specifically with her left grip strength. She reports generalized weakness of both legs. She has had a mild headache intermittently recently, none currently. She denies any visual changes, dizziness, or speech difficulty. She denies chest pain, shortness of breath, fevers, vomiting, or abdominal pain. No urinary symptoms. She recently finished antibiotics for her chronic left leg wound but missed her wound clinic appointment recently because of her weakness.  Past Medical History:  Diagnosis Date  . Anxiety   . Arthritis    "back, arms, legs" (03/24/2016)  . Asthma   . Chronic lower back pain   . Colonic polyp    last colonoscopy done in 2009 with normal results per medical record  . Depressive disorder   . Gastric polyp   . Gout    has taken allopurinol 300mg  once daily in past  . Headache   . History of hiatal hernia   . Hypertension   . Migraine    "none in awhile; might have a couple/year" (03/24/2016)  . Mixed hyperlipidemia    01/2016 Total chol 141, HDL 59, LDL 63, ration 1.1  . Osteoarthritis   . TIA (transient ischemic attack) 11/2014  . Type II diabetes mellitus [facility](HCC)      Patient Active Problem List   Diagnosis Date Noted  . Depression with anxiety 05/30/2016  . Left Lower extremity pain  04/02/2016  . Lower extremity pain, inferior, left 04/02/2016  . Left leg pain   . Personal history of noncompliance  with medical treatment, presenting hazards to health 03/31/2016  . Jaw pain 03/27/2016  . Left leg cellulitis 03/24/2016  . Cellulitis of left lower extremity   . Primary insomnia   . Adjustment disorder   . Morbid obesity (HCC) 01/27/2016  . Wheelchair dependent 01/27/2016  . Gout 01/27/2016  . Asthma 01/27/2016  . Arthritis 01/27/2016  . Uncontrolled diabetes mellitus type 2 with peripheral artery disease (HCC) 01/25/2016  . Cellulitis of left leg 01/11/2016  . Insulin-requiring or dependent type II diabetes mellitus (HCC) 01/11/2016  . Hypertension 01/11/2016    Past Surgical History:  Procedure Laterality Date  . BREAST BIOPSY Left ~ 2015   benign  . CARPAL TUNNEL RELEASE Bilateral   . DILATION AND CURETTAGE OF UTERUS    . KNEE ARTHROSCOPY Right 2003   in [place]  . LAPAROSCOPIC CHOLECYSTECTOMY    . TUBAL LIGATION    . VAGINAL HYSTERECTOMY  1982    OB History    No data available        Home Medications    Prior to Admission medications   Medication Sig Start Date End Date Taking? Authorizing Provider  albuterol (PROVENTIL HFA;VENTOLIN HFA) 108 (90 Base) MCG/ACT inhaler Inhale 2 puffs into the lungs every 6 (six) hours as needed for wheezing or shortness of breath.    Historical Provider, MD  allopurinol (ZYLOPRIM) 300 MG tablet Take 300 mg  by mouth daily.    Historical Provider, MD  aspirin 325 MG tablet Take 325 mg by mouth daily.    Historical Provider, MD  atenolol (TENORMIN) 25 MG tablet Take 25-50 mg by mouth 2 (two) times daily. Take 50 mg every morning and then 25 mg every evening    Historical Provider, MD  bisacodyl (DULCOLAX) 5 MG EC tablet Take 5 mg by mouth daily as needed for moderate constipation.    Historical Provider, MD  clonazePAM (KLONOPIN) 0.5 MG tablet Take 1 tablet (0.5 mg total) by mouth daily as needed for anxiety. 02/25/16   Avanell Shackleton, NP  cyclobenzaprine (FLEXERIL) 10 MG tablet Take 10 mg by mouth 3 (three) times daily as needed for  muscle spasms.    Historical Provider, MD  diclofenac sodium (VOLTAREN) 1 % GEL Apply 4 g topically 4 (four) times daily. Patient taking differently: Apply 4 g topically 4 (four) times daily as needed (pain).  02/25/16   Avanell Shackleton, NP  doxycycline (VIBRAMYCIN) 100 MG capsule Take 1 capsule (100 mg total) by mouth 2 (two) times daily. One po bid x 7 days 05/20/16   Mercedes Camprubi-Soms, PA-C  gabapentin (NEURONTIN) 300 MG capsule Take 1 capsule (300 mg total) by mouth 2 (two) times daily. 01/31/16   Avanell Shackleton, NP  hydrocerin (EUCERIN) CREA Apply 1 application topically daily. 01/15/16   Leroy Sea, MD  hydrOXYzine (VISTARIL) 50 MG capsule Take 50 mg by mouth 3 (three) times daily as needed for itching.     Historical Provider, MD  insulin degludec (TRESIBA FLEXTOUCH) 100 UNIT/ML SOPN FlexTouch Pen Inject 0.75 mLs (75 Units total) into the skin daily. Patient taking differently: Inject 75 Units into the skin every evening.  03/14/16   Avanell Shackleton, NP  Insulin Glulisine (APIDRA SOLOSTAR) 100 UNIT/ML Solostar Pen Inject 15 Units into the skin 2 (two) times daily.    Historical Provider, MD  NIFEdipine (PROCARDIA XL/ADALAT-CC) 90 MG 24 hr tablet Take 90 mg by mouth daily.    Historical Provider, MD  polyethylene glycol (MIRALAX / GLYCOLAX) packet Take 17 g by mouth daily as needed for moderate constipation.    Historical Provider, MD      Family History  Problem Relation Age of Onset  . Stroke Father   . Stroke Brother   . Cancer Other      Social History  Substance Use Topics  . Smoking status: Never Smoker  . Smokeless tobacco: Never Used  . Alcohol use No     Allergies     Ace inhibitors and Metformin and related    Review of Systems  10 Systems reviewed and are negative for acute change except as noted in the HPI.   Physical Exam Updated Vital Signs BP 106/61   Pulse 71   Temp 98.3 F (36.8 C) (Oral)   Resp 16   Ht 5\' 3"  (1.6 m)   Wt (!) 330 lb  (149.7 kg)   SpO2 100%   BMI 58.46 kg/m   Physical Exam  Constitutional: She is oriented to person, place, and time. She appears well-developed and well-nourished. No distress.  Awake, alert  HENT:  Head: Normocephalic and atraumatic.  Eyes: Conjunctivae and EOM are normal. Pupils are equal, round, and reactive to light.  Neck: Neck supple.  Cardiovascular: Normal rate, regular rhythm and normal heart sounds.   No murmur heard. Pulmonary/Chest: Effort normal and breath sounds normal. No respiratory distress.  Abdominal: Soft. Bowel sounds  are normal. She exhibits no distension. There is no tenderness.  Musculoskeletal: She exhibits edema.  BLE edema L>R  Neurological: She is alert and oriented to person, place, and time. She has normal reflexes. No cranial nerve deficit.  Fluent speech, normal finger-to-nose testing, subtle L pronator drift, no clonus 5/5 strength RUE, 4/5 strength LUE 3/5 strength BLE  Skin: Skin is warm and dry.  Chronic scaling and skin changes of L lower leg with small amount of drainage  Psychiatric: She has a normal mood and affect. Judgment and thought content normal.  Nursing note and vitals reviewed.     ED Treatments / Results  Labs (all labs ordered are listed, but only abnormal results are displayed) Labs Reviewed  CBC - Abnormal; Notable for the following:       Result Value   WBC 10.6 (*)    All other components within normal limits  BASIC METABOLIC PANEL  URINALYSIS, ROUTINE W REFLEX MICROSCOPIC  CBG MONITORING, ED     EKG  EKG Interpretation  Date/Time:  Friday May 30 2016 19:50:07 EST Ventricular Rate:  73 PR Interval:    QRS Duration: 89 QT Interval:  409 QTC Calculation: 451 R Axis:   -11 Text Interpretation:  Sinus rhythm Ventricular premature complex Low voltage, precordial leads LVH by voltage Anterior Q waves, possibly due to LVH Borderline T abnormalities, inferior leads T wave inversion III, no previous available for  comparison Confirmed by Shrihan Putt MD, Adora Yeh [phone]) on 05/30/2016 8:12:26 PM         Radiology Dg Chest 2 View  Result Date: 05/30/2016 CLINICAL DATA:  Generalized weakness. Chills for the past 2 weeks. Intermittent central chest pain. Left leg cellulitis for 1 year. EXAM: CHEST  2 VIEW COMPARISON:  None. FINDINGS: Normal sized heart. Clear lungs. Central peribronchial thickening. Mild thoracic spine degenerative changes. IMPRESSION: Mild to moderate bronchitic changes. Electronically Signed   By: Beckie Salts M.D.   On: 05/30/2016 19:38   Ct Head Wo Contrast  Result Date: 05/30/2016 CLINICAL DATA:  Left arm weakness. Bilateral leg weakness. Generalized weakness. EXAM: CT HEAD WITHOUT CONTRAST TECHNIQUE: Contiguous axial images were obtained from the base of the skull through the vertex without intravenous contrast. COMPARISON:  Maxillofacial CT dated 03/07/2016. FINDINGS: Brain: No evidence of acute infarction, hemorrhage, hydrocephalus, extra-axial collection or mass lesion/mass effect. Vascular: No hyperdense vessel or unexpected calcification. Skull: Normal. Negative for fracture or focal lesion. Sinuses/Orbits: No acute finding. Other: None. IMPRESSION: Normal examination. Electronically Signed   By: Beckie Salts M.D.   On: 05/30/2016 19:23    Procedures Procedures (including critical care time) Procedures  Medications Ordered in ED  Medications - No data to display   Initial Impression / Assessment and Plan / ED Course  I have reviewed the triage vital signs and the nursing notes.  Pertinent labs & imaging results that were available during my care of the patient were reviewed by me and considered in my medical decision making (see chart for details).  Clinical Course    PT w/ Several weeks of progressive weakness, she also notes intermittent left grip strength weakness. She was comfortable on exam with reassuring vital signs. She did have left arm weakness compared to right,  symmetric weakness of both legs. Differential includes infectious process such as UTI, generalized deconditioning, or CVA given the patient's risk factors. Obtained labs as well as chest x-ray, head CT.  Head CT negative acute. UA and chest x-ray negative for infectious process. Lab work  unremarkable. I am concerned about the patient's left arm weakness and leg weakness given that she has multiple risk factors for stroke. Discussed admission for stroke workup with [facility], Dr. Antionette Charpyd, appreciate his assistance. Pt admitted for further work up.   Final Clinical Impressions(s) / ED Diagnoses   Final diagnoses:  Left arm weakness  Bilateral leg weakness  Leg wound, left, subsequent encounter     New Prescriptions   No medications on file       Laurence Spatesachel Morgan Jacquelina Hewins, MD 05/30/16 2152

## 2016-05-30 NOTE — ED Triage Notes (Signed)
Pt states about 3 weeks ago she started having weakness that comes and goes, difficulty caring for self and ambulating. Left side grip strength weaker than right. Pt states she has had a headache. Pt denies vision changes, dizziness, slurred speech. Pt also states she was unable to go to her wound care appointment today.

## 2016-05-30 NOTE — ED Notes (Signed)
Attempted to call report

## 2016-05-30 NOTE — H&P (Signed)
History and Physical    Sabriah Hobbins ELF:[id number] DOB: 1951-01-07 DOA: 05/30/2016  PCP: Phineas Inches, MD   Patient coming from: Home  Chief Complaint: Left arm weakness, generalized weakness   HPI: [name] is a right-handed 65 y.o. female with medical history significant for insulin-dependent diabetes mellitus, hypertension, gout, anxiety, chronic left lower leg wound, and TIA who presents to the ED with 3 weeks of generalized weakness and left arm weakness. Patient reports being discharged from a skilled nursing facility approximately 1 month ago and initially did well back at home before developing a generalized weakness approximately 3 weeks ago. This has progressively worsened in the ensuing days and patient reports that she is now unable to get out of bed or perform her ADLs. Over the same interval, she has noted particular weakness involving the left arm and left hand. She reports that there appeared to be some swelling involving the distal left arm and pain that has waxed and waned over the past 3 weeks, but the arm and hand have remained weak relative to the right side. Patient denies any fall or trauma and denies any change in vision or hearing or loss of coordination. She denies any focal numbness or tingling. She denies any recent fevers, chest pain, palpitations, cough, or dyspnea. She has just completed a course of doxycycline for a cellulitis involving the left lower leg surrounding a chronic ulcer.  ED Course: Upon arrival to the ED, patient is found to be afebrile, saturating well on room air, and with vital signs stable. EKG features a sinus rhythm with PVC and low voltage QRS. Chest x-ray is notable for mild-to-moderate bronchitic changes and CT of the head is normal. Chemistry panel is unremarkable and CBC is notable only for a mild leukocytosis to 10,600. Urinalysis is unremarkable. Patient remained hemodynamically stable in the ED and in no respiratory distress and will  be observed in the telemetry unit for ongoing evaluation and management of generalized weakness and debility with increased left arm weakness relative to right, raising concern for possible CVA.  Review of Systems:  All other systems reviewed and apart from HPI, are negative.  Past Medical History:  Diagnosis Date  . Anxiety   . Arthritis    "back, arms, legs" (03/24/2016)  . Asthma   . Chronic lower back pain   . Colonic polyp    last colonoscopy done in 2009 with normal results per medical record  . Depressive disorder   . Gastric polyp   . Gout    has taken allopurinol 357m once daily in past  . Headache   . History of hiatal hernia   . Hypertension   . Migraine    "none in awhile; might have a couple/year" (03/24/2016)  . Mixed hyperlipidemia    01/2016 Total chol 141, HDL 59, LDL 63, ration 1.1  . Osteoarthritis   . TIA (transient ischemic attack) 11/2014  [place]     Past Surgical History:  Procedure Laterality Date  . BREAST BIOPSY Left ~ 2015   benign  . CARPAL TUNNEL RELEASE Bilateral   . DILATION AND CURETTAGE OF UTERUS    . [place] LAPAROSCOPIC CHOLECYSTECTOMY    . TUBAL LIGATION    . VAGINAL HYSTERECTOMY  1982     reports that she has never smoked. She has never used smokeless tobacco. She reports that she does not drink alcohol or use drugs.  Allergies  Allergen Reactions  . Ace Inhibitors Swelling  . Metformin And Related Other (See Comments)    chills    Family History  Problem Relation Age of Onset  . Stroke Father   . Stroke Brother   . Cancer Other      Prior to Admission medications   Medication Sig Start Date End Date Taking? Authorizing Provider  albuterol (PROVENTIL HFA;VENTOLIN HFA) 108 (90 Base) MCG/ACT inhaler Inhale 2 puffs into the lungs every 6 (six) hours as needed for wheezing or shortness of breath.   Yes Historical Provider, MD  allopurinol (ZYLOPRIM) 300 MG tablet  Take 300 mg by mouth daily.   Yes Historical Provider, MD  aspirin 325 MG tablet Take 325 mg by mouth daily.   Yes Historical Provider, MD  atenolol (TENORMIN) 25 MG tablet Take 25-50 mg by mouth 2 (two) times daily. Take 50 mg every morning and then 25 mg every evening   Yes Historical Provider, MD  bisacodyl (DULCOLAX) 5 MG EC tablet Take 5 mg by mouth daily as needed for moderate constipation.   Yes Historical Provider, MD  clonazePAM (KLONOPIN) 0.5 MG tablet Take 1 tablet (0.5 mg total) by mouth daily as needed for anxiety. 02/25/16  Yes Girtha Rm, NP  cyclobenzaprine (FLEXERIL) 10 MG tablet Take 10 mg by mouth 3 (three) times daily as needed for muscle spasms.   Yes Historical Provider, MD  diclofenac sodium (VOLTAREN) 1 % GEL Apply 4 g topically 4 (four) times daily. Patient taking differently: Apply 4 g topically 4 (four) times daily as needed (pain).  02/25/16  Yes Girtha Rm, NP  gabapentin (NEURONTIN) 300 MG capsule Take 1 capsule (300 mg total) by mouth 2 (two) times daily. 01/31/16  Yes Girtha Rm, NP  hydrocerin (EUCERIN) CREA Apply 1 application topically daily. 01/15/16  Yes Thurnell Lose, MD  insulin degludec (TRESIBA FLEXTOUCH) 100 UNIT/ML SOPN FlexTouch Pen Inject 0.75 mLs (75 Units total) into the skin daily. Patient taking differently: Inject 75 Units into the skin every evening.  03/14/16  Yes Girtha Rm, NP  Insulin Glulisine (APIDRA SOLOSTAR) 100 UNIT/ML Solostar Pen Inject 15 Units into the skin 2 (two) times daily.   Yes Historical Provider, MD  NIFEdipine (PROCARDIA XL/ADALAT-CC) 90 MG 24 hr tablet Take 90 mg by mouth daily.   Yes Historical Provider, MD  polyethylene glycol (MIRALAX / GLYCOLAX) packet Take 17 g by mouth daily as needed for moderate constipation.   Yes Historical Provider, MD  doxycycline (VIBRAMYCIN) 100 MG capsule Take 1 capsule (100 mg total) by mouth 2 (two) times daily. One po bid x 7 days Patient not taking: Reported on 05/30/2016  05/20/16   Zacarias Pontes, PA-C    Physical Exam: Vitals:   05/30/16 2145 05/30/16 2200 05/30/16 2215 05/30/16 2230  BP: 123/63 129/67 126/63 102/72  Pulse: 66 72 68 69  Resp:      Temp:      TempSrc:      SpO2: 96% 96% 96% 94%  Weight:      Height:          Constitutional: NAD, calm, comfortable, obese Eyes: PERTLA, lids and conjunctivae normal ENMT: Mucous membranes are moist. Posterior pharynx clear of any exudate or lesions.   Neck: normal, supple, no masses, no thyromegaly Respiratory: clear to auscultation bilaterally, no wheezing, no crackles. Normal respiratory effort.   Cardiovascular: S1 & S2 heard, regular rate and rhythm. No carotid bruits. No significant JVD. Abdomen: No distension,  no tenderness, no masses palpated. Bowel sounds normal.  Musculoskeletal: no clubbing / cyanosis. No joint deformity upper and lower extremities. Normal muscle tone.  Skin: no significant rashes, lesions, ulcers. Warm, dry, well-perfused. Neurologic: CN 2-12 grossly intact. Sensation to light touch intact throughout face and distal extremities. Patellar and bicipital DTRs normal. Strength 4/5 in b/l LEs, 5/5 throughout the RUE, and 3-4/5 in proximal and distal LUE.   Psychiatric: Normal judgment and insight. Alert and oriented x 3. Normal mood and affect.     Labs on Admission: I have personally reviewed following labs and imaging studies  CBC:  Recent Labs Lab 05/30/16 1713  WBC 10.6*  HGB 13.3  HCT 40.9  MCV 89.9  PLT 329   Basic Metabolic Panel:  Recent Labs Lab 05/30/16 1713  NA 140  K 3.7  CL 105  CO2 24  GLUCOSE 74  BUN 10  CREATININE 0.86  CALCIUM 9.9   GFR: Estimated Creatinine Clearance: 91.5 mL/min (by C-G formula based on SCr of 0.86 mg/dL). Liver Function Tests: No results for input(s): AST, ALT, ALKPHOS, BILITOT, PROT, ALBUMIN in the last 168 hours. No results for input(s): LIPASE, AMYLASE in the last 168 hours. No results for input(s):  AMMONIA in the last 168 hours. Coagulation Profile: No results for input(s): INR, PROTIME in the last 168 hours. Cardiac Enzymes: No results for input(s): CKTOTAL, CKMB, CKMBINDEX, TROPONINI in the last 168 hours. BNP (last 3 results) No results for input(s): PROBNP in the last 8760 hours. HbA1C: No results for input(s): HGBA1C in the last 72 hours. CBG: No results for input(s): GLUCAP in the last 168 hours. Lipid Profile: No results for input(s): CHOL, HDL, LDLCALC, TRIG, CHOLHDL, LDLDIRECT in the last 72 hours. Thyroid Function Tests: No results for input(s): TSH, T4TOTAL, FREET4, T3FREE, THYROIDAB in the last 72 hours. Anemia Panel: No results for input(s): VITAMINB12, FOLATE, FERRITIN, TIBC, IRON, RETICCTPCT in the last 72 hours. Urine analysis:    Component Value Date/Time   COLORURINE YELLOW 05/30/2016 2003   APPEARANCEUR CLEAR 05/30/2016 2003   LABSPEC 1.012 05/30/2016 2003   PHURINE 5.0 05/30/2016 2003   GLUCOSEU NEGATIVE 05/30/2016 2003   HGBUR NEGATIVE 05/30/2016 2003   [place] NEGATIVE 05/30/2016 2003   [place] NEGATIVE 05/30/2016 2003   PROTEINUR NEGATIVE 05/30/2016 2003   NITRITE NEGATIVE 05/30/2016 2003   LEUKOCYTESUR NEGATIVE 05/30/2016 2003   Sepsis Labs: '@LABRCNTIP'$ (procalcitonin:4,lacticidven:4) )No results found for this or any previous visit (from the past 240 hour(s)).   Radiological Exams on Admission: Dg Chest 2 View  Result Date: 05/30/2016 CLINICAL DATA:  Generalized weakness. Chills for the past 2 weeks. Intermittent central chest pain. Left leg cellulitis for 1 year. EXAM: CHEST  2 VIEW COMPARISON:  None. FINDINGS: Normal sized heart. Clear lungs. Central peribronchial thickening. Mild thoracic spine degenerative changes. IMPRESSION: Mild to moderate bronchitic changes. Electronically Signed   By: Claudie Revering M.D.   On: 05/30/2016 19:38   Ct Head Wo Contrast  Result Date: 05/30/2016 CLINICAL DATA:  Left arm weakness. Bilateral leg  weakness. Generalized weakness. EXAM: CT HEAD WITHOUT CONTRAST TECHNIQUE: Contiguous axial images were obtained from the base of the skull through the vertex without intravenous contrast. COMPARISON:  Maxillofacial CT dated 03/07/2016. FINDINGS: Brain: No evidence of acute infarction, hemorrhage, hydrocephalus, extra-axial collection or mass lesion/mass effect. Vascular: No hyperdense vessel or unexpected calcification. Skull: Normal. Negative for fracture or focal lesion. Sinuses/Orbits: No acute finding. Other: None. IMPRESSION: Normal examination. Electronically Signed   By: Claudie Revering  M.D.   On: 05/30/2016 19:23    EKG: Independently reviewed. Sinus rhythm, PVC, low-voltage QRS.   Assessment/Plan  1. Left arm weakness  - There is weakness involving the proximal and distal left arm relative to right  - Primary concern is for a CVA, but head CT negative for intracranial abnormality  - No other focal neurologic deficits are identified  - tPA not considered as she presents well beyond the appropriate timeframe and there is significant diagnostic uncertainty  - Plan to obtain MRI brain, and if positive for acute/subacute CVA, proceed with remainder of CVA workup, and if negative, consider alternative etiologies   - PT, OT evals requested  - She takes a daily ASA 325 and this will be continued   2. Generalized weakness  - Pt reports increasing generalized weakness since shortly after returning home from SNF ~4 wks ago - She reports being unable to get out of bed or perform ADLs d/t generalized weakness - B12 deficiency, thyroid disease, PMR among considerations, or may simply be deconditioning; will check B12 level, thyroid studies, ESR - PT eval requested    3. Insulin-dependent DM  - A1c was 9.7% in October 2017  - She is managed at home with insulin glulisine 15 units BID and Tresiba 75 units qAM; will hold these in hospital  - Check CBG with meals and qHS  - Start with Lantus 70 units  qAM, Novolog 3 units TID with meals, and a low-intensity SSI correctional   4. Hypertension  - At goal currently, will continue nifedipine as tolerated    5. Anxiety  - Appears to be stable, will continue low-dose Klonopin     DVT prophylaxis: sq Lovenox  Code Status: Full  Family Communication: Discussed with patient Disposition Plan: Observe on telemetry Consults called: None Admission status: Observation    Vianne Bulls, MD [facility] Pager [phone]  If 7PM-7AM, please contact night-coverage [email or web address] Password [facility]  05/30/2016, 10:47 PM

## 2016-05-30 NOTE — ED Notes (Signed)
Patient transported to CT 

## 2016-05-31 ENCOUNTER — Observation Stay (HOSPITAL_COMMUNITY): Payer: Medicare PPO

## 2016-05-31 DIAGNOSIS — E119 Type 2 diabetes mellitus without complications: Secondary | ICD-10-CM | POA: Diagnosis not present

## 2016-05-31 DIAGNOSIS — I10 Essential (primary) hypertension: Secondary | ICD-10-CM | POA: Diagnosis not present

## 2016-05-31 DIAGNOSIS — R29898 Other symptoms and signs involving the musculoskeletal system: Secondary | ICD-10-CM | POA: Diagnosis not present

## 2016-05-31 DIAGNOSIS — R531 Weakness: Secondary | ICD-10-CM | POA: Diagnosis not present

## 2016-05-31 LAB — GLUCOSE, CAPILLARY
GLUCOSE-CAPILLARY: 122 mg/dL — AB (ref 65–99)
GLUCOSE-CAPILLARY: 179 mg/dL — AB (ref 65–99)
GLUCOSE-CAPILLARY: 86 mg/dL (ref 65–99)
Glucose-Capillary: 107 mg/dL — ABNORMAL HIGH (ref 65–99)
Glucose-Capillary: 114 mg/dL — ABNORMAL HIGH (ref 65–99)

## 2016-05-31 LAB — LIPID PANEL
CHOLESTEROL: 111 mg/dL (ref 0–200)
HDL: 42 mg/dL (ref >40)
LDL Cholesterol: 53 mg/dL (ref 0–99)
TRIGLYCERIDES: 82 mg/dL (ref <150)
Total CHOL/HDL Ratio: 2.6 RATIO
VLDL: 16 mg/dL (ref 0–40)

## 2016-05-31 MED ORDER — METHOCARBAMOL 500 MG PO TABS
500.0000 mg | ORAL_TABLET | Freq: Four times a day (QID) | ORAL | Status: DC | PRN
  Administered 2016-05-31 (×2): 500 mg via ORAL
  Filled 2016-05-31 (×2): qty 1

## 2016-05-31 MED ORDER — INSULIN GLARGINE 100 UNIT/ML ~~LOC~~ SOLN
70.0000 [IU] | Freq: Every day | SUBCUTANEOUS | Status: DC
  Administered 2016-05-31 – 2016-06-01 (×2): 70 [IU] via SUBCUTANEOUS
  Filled 2016-05-31 (×2): qty 0.7

## 2016-05-31 MED ORDER — PREMIER PROTEIN SHAKE
11.0000 [oz_av] | ORAL | Status: DC
  Administered 2016-05-31: 11 [oz_av] via ORAL
  Filled 2016-05-31: qty 325.31

## 2016-05-31 NOTE — Progress Notes (Signed)
PROGRESS NOTE    [name]  RUE:[id number] DOB: [birth year] DOA: 05/30/2016 PCP: Aura Dials, MD    Brief Narrative: [name] is a right-handed 65 y.o. female with medical history significant for insulin-dependent diabetes mellitus, hypertension, gout, anxiety, chronic left lower leg wound, and TIA who presents to the ED with 3 weeks of generalized weakness and left arm weakness.  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  PT eval requested , recommended SNF placement.    Diabetes mellitus: CBG (last 3)   Recent Labs  05/31/16 0631 05/31/16 1114 05/31/16 1613  GLUCAP 107* 179* 122*    Resume SSI.  hgba1c 9.7 in 03/2016. Repeat HGBA1C. Ordered.    Generalized weakness:suspect from deconditioning. Recommend PT eval. Get TSH and CK level.    Hypertension  Well controlled.    DVT prophylaxis: (Lovenox/) Code Status: (Full/) Family Communication: family at bedside.  Disposition Plan: none.    Consultants:   None.    Procedures: MRI brain.    Antimicrobials: none.    Subjective: Reports having weakness in the left arm.  Objective: Vitals:   05/31/16 0957 05/31/16 1000 05/31/16 1245 05/31/16 1726  BP: 133/74 139/70 124/65 119/68  Pulse: 68 72 66 85  Resp:   15 16  Temp:  98.7 F (37.1 C) 97.8 F (36.6 C) 98.2 F (36.8 C)  TempSrc:  Oral Oral Oral  SpO2:  97% 97% 97%  Weight:      Height:       No intake or output data in the 24 hours ending 05/31/16 1739 Filed Weights   05/30/16 1716 05/30/16 2344  Weight: (!) 149.7 kg (330 lb) (!) 143.7 kg (316 lb 11.2 oz)    Examination:  General exam: Appears calm and comfortable  Respiratory system: Clear to auscultation.  Respiratory effort normal. Cardiovascular system: S1 & S2 heard, RRR. No JVD, murmurs, rubs, gallops or clicks. No pedal edema. Gastrointestinal system: Abdomen is nondistended, soft and nontender. No organomegaly or masses felt. Normal bowel sounds heard. Central nervous system: Alert and oriented. No focal neurological deficits. Extremities: no pedal edema.  Skin: No rashes, lesions or ulcers Psychiatry: Judgement and insight appear normal. Mood & affect appropriate.     Data Reviewed: I have personally reviewed following labs and imaging studies  CBC:  Recent Labs Lab 05/30/16 1713  WBC 10.6*  HGB 13.3  HCT 40.9  MCV 89.9  PLT 358   Basic Metabolic Panel:  Recent Labs Lab 05/30/16 1713  NA 140  K 3.7  CL 105  CO2 24  GLUCOSE 74  BUN 10  CREATININE 0.86  CALCIUM 9.9   GFR: Estimated Creatinine Clearance: 87.7 mL/min (by C-G formula based on SCr of 0.86 mg/dL). Liver Function Tests: No results for input(s): AST, ALT, ALKPHOS, BILITOT, PROT, ALBUMIN in the last 168 hours. No results for input(s): LIPASE, AMYLASE in the last 168 hours. No results for input(s): AMMONIA in the last 168 hours. Coagulation Profile: No results for input(s): INR, PROTIME in the last 168 hours. Cardiac Enzymes: No results for input(s): CKTOTAL, CKMB, CKMBINDEX, TROPONINI in the last 168 hours. BNP (last 3 results) No results for input(s): PROBNP in the last 8760 hours. HbA1C: No results  for input(s): HGBA1C in the last 72 hours. CBG:  Recent Labs Lab 05/30/16 2339 05/31/16 0631 05/31/16 1114 05/31/16 1613  GLUCAP 86 107* 179* 122*   Lipid Profile:  Recent Labs  05/31/16 0344  CHOL 111  HDL 42  LDLCALC 53  TRIG 82  CHOLHDL 2.6   Thyroid Function Tests: No results for input(s): TSH, T4TOTAL, FREET4, T3FREE, THYROIDAB in the last 72 hours. Anemia Panel: No results for input(s): VITAMINB12, FOLATE, FERRITIN, TIBC, IRON, RETICCTPCT in the last 72 hours. Sepsis Labs: No  results for input(s): PROCALCITON, LATICACIDVEN in the last 168 hours.  No results found for this or any previous visit (from the past 240 hour(s)).       Radiology Studies: Dg Chest 2 View  Result Date: 05/30/2016 CLINICAL DATA:  Generalized weakness. Chills for the past 2 weeks. Intermittent central chest pain. Left leg cellulitis for 1 year. EXAM: CHEST  2 VIEW COMPARISON:  None. FINDINGS: Normal sized heart. Clear lungs. Central peribronchial thickening. Mild thoracic spine degenerative changes. IMPRESSION: Mild to moderate bronchitic changes. Electronically Signed   By: Beckie SaltsSteven  Reid M.D.   On: 05/30/2016 19:38   Ct Head Wo Contrast  Result Date: 05/30/2016 CLINICAL DATA:  Left arm weakness. Bilateral leg weakness. Generalized weakness. EXAM: CT HEAD WITHOUT CONTRAST TECHNIQUE: Contiguous axial images were obtained from the base of the skull through the vertex without intravenous contrast. COMPARISON:  Maxillofacial CT dated 03/07/2016. FINDINGS: Brain: No evidence of acute infarction, hemorrhage, hydrocephalus, extra-axial collection or mass lesion/mass effect. Vascular: No hyperdense vessel or unexpected calcification. Skull: Normal. Negative for fracture or focal lesion. Sinuses/Orbits: No acute finding. Other: None. IMPRESSION: Normal examination. Electronically Signed   By: Beckie SaltsSteven  Reid M.D.   On: 05/30/2016 19:23   Mr Brain Wo Contrast  Result Date: 05/31/2016 CLINICAL DATA:  Generalized weakness beginning about 3 weeks ago, progressively worsening. EXAM: MRI HEAD WITHOUT CONTRAST TECHNIQUE: Multiplanar, multiecho pulse sequences of the brain and surrounding structures were obtained without intravenous contrast. COMPARISON:  Head CT 05/30/2016 FINDINGS: Brain: Diffusion imaging does not show any acute or subacute infarction. There is a non acute small vessel infarction within the right side of the pons. Exact age is indeterminate. No focal cerebellar insult. Cerebral hemispheres show  chronic small-vessel ischemic changes within the deep and subcortical white matter, mild moderate in degree. No cortical or large vessel territory infarction. No mass lesion, hemorrhage, hydrocephalus or extra-axial collection. No pituitary mass. Vascular: Major vessels at the base of the brain show flow. Skull and upper cervical spine: Negative Sinuses/Orbits: Clear/normal Other: None significant IMPRESSION: No acute finding by MRI. 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 Nonacute small vessel infarction right pons. Electronically Signed   By: Paulina FusiMark  Shogry M.D.   On: 05/31/2016 13:03        Scheduled Meds: . allopurinol  300 mg Oral Daily  . aspirin  325 mg Oral Daily  . atenolol  25 mg Oral QHS  . atenolol  50 mg Oral Daily  . enoxaparin (LOVENOX) injection  40 mg Subcutaneous Q24H  . gabapentin  300 mg Oral BID  . insulin aspart  0-5 Units Subcutaneous QHS  . insulin aspart  0-9 Units Subcutaneous TID WC  . insulin aspart  3 Units Subcutaneous TID WC  . insulin glargine  70 Units Subcutaneous Daily  . NIFEdipine  90 mg Oral Daily  . protein supplement shake  11 oz Oral Q24H   Continuous Infusions:  LOS: 0 days    Time spent: 30 minutes.     Kathlen ModyAKULA,Narvel Kozub, MD [facility] Pager [phone]  If 7PM-7AM, please contact night-coverage [email or web address] Password [email or web address] 05/31/2016, 5:39 PM

## 2016-05-31 NOTE — Progress Notes (Signed)
Initial Nutrition Assessment  DOCUMENTATION CODES:   Morbid obesity  INTERVENTION:    Premier Protein Shake once daily, each supplement provides 160 calories and 30 gm protein,  NUTRITION DIAGNOSIS:   Increased nutrient needs related to wound healing as evidenced by estimated needs.  GOAL:   Patient will meet greater than or equal to 90% of their needs  MONITOR:   PO intake, Supplement acceptance, Labs, Weight trends, Skin  REASON FOR ASSESSMENT:   Malnutrition Screening Tool    ASSESSMENT:   65 y.o. female with medical history significant for insulin-dependent diabetes mellitus, hypertension, gout, anxiety, chronic left lower leg wound, and TIA who presents to the ED with 3 weeks of generalized weakness and left arm weakness.   Patient reports that she has been eating well sometimes and poorly at other times due to poor appetite. She says she has tried to eat healthier, but was surprised to learn she had lost 39 lbs in the past 2 months.  Nutrition focused physical exam completed.  No muscle or subcutaneous fat depletion noticed. Moderate edema. Patient with increased protein needs to support healing of chronic left lower leg wound. She agreed to receive a protein shake daily.  Diet Order:  Diet Carb Modified Fluid consistency: Thin; Room service appropriate? Yes  Skin:  Wound (see comment) (Chronic wound (cellulitis) to L leg)  Last BM:  12/14  Height:   Ht Readings from Last 1 Encounters:  05/30/16 5\' 2"  (1.575 m)    Weight:   Wt Readings from Last 1 Encounters:  05/30/16 (!) 316 lb 11.2 oz (143.7 kg)    Ideal Body Weight:  50 kg  BMI:  Body mass index is 57.93 kg/m.  Estimated Nutritional Needs:   Kcal:  2000-2200  Protein:  125-150 gm  Fluid:  2-2.2 L  EDUCATION NEEDS:   No education needs identified at this time  Joaquin CourtsKimberly Harris, RD, LDN, CNSC Pager [phone] After Hours Pager [phone]

## 2016-05-31 NOTE — Progress Notes (Signed)
Occupational Therapy Evaluation Addendum:    05/31/16 1900  OT G-codes **NOT FOR INPATIENT CLASS**  Functional Limitation Self care  Self Care Current Status [phone](G8987) CL  Self Care Goal Status ([phone](G8988) CK  [name], OTR/L [phone]

## 2016-05-31 NOTE — Evaluation (Signed)
Physical Therapy Evaluation Patient Details Name: [name] Kuunice Ryant MRN: [id number] DOB: [birth year] Today's Date: 05/31/2016   History of Present Illness  [name] is a right-handed 65 y.o. female with medical history significant for insulin-dependent diabetes mellitus, hypertension, gout, anxiety, chronic left lower leg wound, and TIA who presents to the ED with 3 weeks of generalized weakness and left arm weakness.   Clinical Impression  Pt admitted with above complications. Pt currently with functional limitations due to the deficits listed below (see PT Problem List). She demonstrates generalized weakness and deconditioning. Poor effort with motor testing and general function; I fear if she returns home, lack of family support and poor motivation may result in further decline in function and health. SNF may be most appropriate placement to improve function and prevent further decline, although she will have to remain more active once she returns home. Pt will benefit from skilled PT to increase their independence and safety with mobility to allow discharge to the venue listed below.       Follow Up Recommendations SNF (If she does not qualify, consider HHPT)    Equipment Recommendations  None recommended by PT    Recommendations for Other Services       Precautions / Restrictions Precautions Precautions: Fall Restrictions Weight Bearing Restrictions: No      Mobility  Bed Mobility Overal bed mobility: Needs Assistance Bed Mobility: Supine to Sit     Supine to sit: Supervision     General bed mobility comments: Slow, very effortful but able to perform on her own with supervision and heavy use of rail.  Transfers Overall transfer level: Needs assistance Equipment used: Rolling walker (2 wheeled) Transfers: Sit to/from Stand Sit to Stand: Supervision         General transfer comment: Slow to rise, rocks for momentum. Heavy use of hands on RW despite cues for proper  hand placement. Poor standing posture once upright.  Ambulation/Gait Ambulation/Gait assistance:  (Unable)              Stairs            Wheelchair Mobility    Modified Rankin (Stroke Patients Only) Modified Rankin (Stroke Patients Only) Pre-Morbid Rankin Score: Moderately severe disability Modified Rankin: Moderately severe disability     Balance Overall balance assessment: Needs assistance Sitting-balance support: No upper extremity supported Sitting balance-Leahy Scale: Fair     Standing balance support: Single extremity supported Standing balance-Leahy Scale: Poor Standing balance comment: Heavy reliance on RW for support. Frequent cues for upright posture. Only tolerated standing approx 4 minutes.  Able to march feet a couple of times. Could not advance gait forward.                             Pertinent Vitals/Pain      Home Living Family/patient expects to be discharged to:: Private residence Living Arrangements: Spouse/significant other;Children Available Help at Discharge: Family;Available 24 hours/day Type of Home: Apartment Home Access: Ramped entrance     Home Layout: One level Home Equipment: Wheelchair - manual;Wheelchair - power;Bedside commode;Walker - 2 wheels      Prior Function Level of Independence: Needs assistance   Gait / Transfers Assistance Needed: Pt states she could only take a few steps at baseline. Mostly transfers.  ADL's / Homemaking Assistance Needed: Husband was helping occasionally with ADLs, cannot  Comments: Pt has reacher/sock aid etc to assist with adls but usually husband just assists her.  Hand Dominance   Dominant Hand: Right    Extremity/Trunk Assessment   Upper Extremity Assessment Upper Extremity Assessment: LUE deficits/detail LUE Deficits / Details: Weakness Lt hand    Lower Extremity Assessment Lower Extremity Assessment: Generalized weakness (Functional ability not consistent with  MMT)       Communication   Communication: No difficulties  Cognition Arousal/Alertness: Awake/alert Behavior During Therapy: WFL for tasks assessed/performed Overall Cognitive Status: Within Functional Limits for tasks assessed                      General Comments      Exercises Other Exercises Other Exercises: Discussed need for continuous exercise, including sit<>stand once she improves.   Assessment/Plan    PT Assessment Patient needs continued PT services  PT Problem List Decreased strength;Decreased range of motion;Decreased activity tolerance;Decreased balance;Decreased mobility;Obesity          PT Treatment Interventions DME instruction;Gait training;Functional mobility training;Therapeutic activities;Therapeutic exercise;Balance training;Patient/family education    PT Goals (Current goals can be found in the Care Plan section)  Acute Rehab PT Goals Patient Stated Goal: Go to SNF and do rehab at my own pace PT Goal Formulation: With patient Time For Goal Achievement: 06/14/16 Potential to Achieve Goals: Fair    Frequency Min 3X/week   Barriers to discharge Decreased caregiver support husband has poor health, difficulty caring for pt.    Co-evaluation               End of Session Equipment Utilized During Treatment: Gait belt Activity Tolerance: Patient limited by fatigue Patient left: in bed;with call bell/phone within reach;Other (comment) (OT in room) Nurse Communication: Mobility status    Functional Assessment Tool Used: clinical observation Functional Limitation: Mobility: Walking and moving around Mobility: Walking and Moving Around Current Status [phone]([id number]): At least 40 percent but less than 60 percent impaired, limited or restricted Mobility: Walking and Moving Around Goal Status [phone](G8979): At least 20 percent but less than 40 percent impaired, limited or restricted    Time: 1500-1526 PT Time Calculation (min) (ACUTE ONLY): 26  min   Charges:   PT Evaluation $PT Eval Moderate Complexity: 1 Procedure PT Treatments $Therapeutic Activity: 8-22 mins   PT G Codes:   PT G-Codes **NOT FOR INPATIENT CLASS** Functional Assessment Tool Used: clinical observation Functional Limitation: Mobility: Walking and moving around Mobility: Walking and Moving Around Current Status ([id number](G8978): At least 40 percent but less than 60 percent impaired, limited or restricted Mobility: Walking and Moving Around Goal Status [phone]([phone]): At least 20 percent but less than 40 percent impaired, limited or restricted    Berton MountLogan S Taaj Hurlbut 05/31/2016, 3:55 PM  Charlsie MerlesLogan Secor Allyne Hebert, PT

## 2016-05-31 NOTE — Progress Notes (Signed)
PT Cancellation Note  Patient Details Name: [name] MRN: [id number] DOB: 08-Aug-1950   Cancelled Treatment:    Reason Eval/Treat Not Completed: Patient at procedure or test/unavailable   Patient was at MRI when PT stopped in for evaluation. We will check back shortly.   Berton MountLogan S Watt Geiler 05/31/2016, 1:47 PM  [phone]

## 2016-05-31 NOTE — Evaluation (Signed)
Occupational Therapy Evaluation Patient Details Name: [name] MRN: [id number] DOB: [birth year] Today's Date: 05/31/2016    History of Present Illness [name] is a right-handed 65 y.o. female with medical history significant for insulin-dependent diabetes mellitus, hypertension, gout, anxiety, chronic left lower leg wound, and TIA who presents to the ED with 3 weeks of generalized weakness and left arm weakness.    Clinical Impression   Pt admitted with above. She demonstrates the below listed deficits and will benefit from continued OT to maximize safety and independence with BADLs.  Pt currently requires mod - total  A for ADLs. Lives with her spouse, who is unable to provide this level of care. Recommend SNF       Follow Up Recommendations  SNF;Supervision/Assistance - 24 hour    Equipment Recommendations  Tub/shower bench    Recommendations for Other Services       Precautions / Restrictions Precautions Precautions: Fall Restrictions Weight Bearing Restrictions: No      Mobility Bed Mobility               General bed mobility comments: pt sitting EOB finishing with PT   Transfers Overall transfer level: Needs assistance Equipment used: Rolling walker (2 wheeled) Transfers: Sit to/from UGI CorporationStand;Stand Pivot Transfers Sit to Stand: Supervision Stand pivot transfers: Min guard       General transfer comment: Pt uses momentum to stand.  She initially started to sink back to bed, but when distracted with conversation was able to stand x 3.5 mins with min guard assist.  She transferred to recliner with min guard assist     Balance Overall balance assessment: Needs assistance Sitting-balance support: Feet supported Sitting balance-Leahy Scale: Fair     Standing balance support: Bilateral upper extremity supported Standing balance-Leahy Scale: Poor                              ADL Overall ADL's : Needs  assistance/impaired Eating/Feeding: Independent   Grooming: Wash/dry hands;Wash/dry face;Oral care;Brushing hair;Set up;Supervision/safety;Sitting   Upper Body Bathing: Moderate assistance;Sitting   Lower Body Bathing: Total assistance;Sit to/from stand   Upper Body Dressing : Minimal assistance;Sitting   Lower Body Dressing: Total assistance;Sit to/from stand   Toilet Transfer: Stand-pivot;BSC;RW;Min Pension scheme managerguard Toilet Transfer Details (indicate cue type and reason): pt initially required min A to move sit to stand  Toileting- ArchitectClothing Manipulation and Hygiene: Total assistance;Sit to/from stand       Functional mobility during ADLs: Rolling walker;Min guard       Vision     Perception     Praxis      Pertinent Vitals/Pain Pain Assessment: Faces Faces Pain Scale: Hurts little more Pain Location: Lt hand, knees - pain "moves around" Pain Descriptors / Indicators: Aching Pain Intervention(s): Monitored during session     Hand Dominance Right   Extremity/Trunk Assessment Upper Extremity Assessment Upper Extremity Assessment: LUE deficits/detail;Generalized weakness LUE Deficits / Details: Pt reports weakness Lt hand, however, testing was inconsistent, and no appreciable deficits noted during function    Lower Extremity Assessment Lower Extremity Assessment: Defer to PT evaluation   Cervical / Trunk Assessment Cervical / Trunk Assessment: Other exceptions Cervical / Trunk Exceptions: morbidly obese    Communication Communication Communication: No difficulties   Cognition Arousal/Alertness: Awake/alert Behavior During Therapy: WFL for tasks assessed/performed Overall Cognitive Status: Within Functional Limits for tasks assessed  General Comments       Exercises       Shoulder Instructions      Home Living Family/patient expects to be discharged to:: Private residence Living Arrangements: Spouse/significant  other;Children Available Help at Discharge: Family;Available 24 hours/day Type of Home: Apartment Home Access: Ramped entrance     Home Layout: One level     Bathroom Shower/Tub: Chief Strategy OfficerTub/shower unit   Bathroom Toilet: Standard     Home Equipment: Wheelchair - Engineer, technical salesmanual;Wheelchair - power;Bedside commode;Walker - 2 wheels;Shower seat;Adaptive equipment;Other (comment) (lift chair ) Adaptive Equipment: Sock aid;Reacher        Prior Functioning/Environment Level of Independence: Needs assistance  Gait / Transfers Assistance Needed: Pt states she could only take a few steps at baseline. Mostly transfers. ADL's / Homemaking Assistance Needed: Husband was helping occasionally with ADLs, cannot step over tub so was sponge bathing    Comments: Pt has reacher/sock aid etc to assist with adls but usually husband just assists her.        OT Problem List: Decreased strength;Decreased activity tolerance;Impaired balance (sitting and/or standing);Decreased knowledge of use of DME or AE;Obesity;Pain   OT Treatment/Interventions: Self-care/ADL training;Therapeutic exercise;DME and/or AE instruction;Therapeutic activities;Balance training;Patient/family education    OT Goals(Current goals can be found in the care plan section) Acute Rehab OT Goals Patient Stated Goal: Go to SNF and do rehab at my own pace OT Goal Formulation: With patient Time For Goal Achievement: 06/14/16 Potential to Achieve Goals: Good ADL Goals Pt Will Perform Upper Body Bathing: with set-up;with supervision;sitting Pt Will Perform Lower Body Bathing: with min assist;with adaptive equipment;sit to/from stand Pt Will Perform Lower Body Dressing: with min assist;with adaptive equipment;sit to/from stand Pt Will Transfer to Toilet: with supervision;stand pivot transfer;bedside commode Pt Will Perform Toileting - Clothing Manipulation and hygiene: with min assist;with adaptive equipment;sit to/from stand Pt/caregiver will  Perform Home Exercise Program: Increased strength;Right Upper extremity;Left upper extremity;With Supervision;With written HEP provided;With theraband  OT Frequency: Min 2X/week   Barriers to D/C: Decreased caregiver support          Co-evaluation              End of Session Equipment Utilized During Treatment: Rolling walker;Gait belt Nurse Communication: Mobility status  Activity Tolerance: Patient tolerated treatment well Patient left: in chair;with call bell/phone within reach;with chair alarm set   Time: [id number] OT Time Calculation (min): 26 min Charges:  OT General Charges $OT Visit: 1 Procedure OT Evaluation $OT Eval Moderate Complexity: 1 Procedure OT Treatments $Self Care/Home Management : 8-22 mins G-Codes:    Sharde Gover M 05/31/2016, 7:21 PM

## 2016-06-01 ENCOUNTER — Observation Stay (HOSPITAL_COMMUNITY): Payer: Medicare PPO

## 2016-06-01 DIAGNOSIS — F418 Other specified anxiety disorders: Secondary | ICD-10-CM

## 2016-06-01 DIAGNOSIS — I10 Essential (primary) hypertension: Secondary | ICD-10-CM

## 2016-06-01 DIAGNOSIS — E119 Type 2 diabetes mellitus without complications: Secondary | ICD-10-CM

## 2016-06-01 DIAGNOSIS — R29898 Other symptoms and signs involving the musculoskeletal system: Secondary | ICD-10-CM

## 2016-06-01 DIAGNOSIS — R531 Weakness: Secondary | ICD-10-CM

## 2016-06-01 DIAGNOSIS — Z794 Long term (current) use of insulin: Secondary | ICD-10-CM

## 2016-06-01 LAB — CREATININE, SERUM
Creatinine, Ser: 0.91 mg/dL (ref 0.44–1.00)
GFR calc Af Amer: >60 mL/min (ref >60)
GFR calc non Af Amer: >60 mL/min (ref >60)

## 2016-06-01 LAB — HEMOGLOBIN A1C
Hgb A1c MFr Bld: 8.6 % — ABNORMAL HIGH (ref 4.8–5.6)
MEAN PLASMA GLUCOSE: 200 mg/dL

## 2016-06-01 LAB — GLUCOSE, CAPILLARY
GLUCOSE-CAPILLARY: 144 mg/dL — AB (ref 65–99)
Glucose-Capillary: 134 mg/dL — ABNORMAL HIGH (ref 65–99)

## 2016-06-01 LAB — VITAMIN B12: VITAMIN B 12: 543 pg/mL (ref 180–914)

## 2016-06-01 LAB — TSH: TSH: 1.675 u[IU]/mL (ref 0.350–4.500)

## 2016-06-01 LAB — CK: CK TOTAL: 87 U/L (ref 38–234)

## 2016-06-01 LAB — T4, FREE: FREE T4: 0.79 ng/dL (ref 0.61–1.12)

## 2016-06-01 LAB — SEDIMENTATION RATE: SED RATE: 79 mm/h — AB (ref 0–22)

## 2016-06-01 MED ORDER — CLONAZEPAM 0.5 MG PO TABS: 0.5000 mg | ORAL_TABLET | Freq: Every day | ORAL | 0 refills | Status: DC | PRN

## 2016-06-01 NOTE — Discharge Instructions (Signed)
Weakness °Weakness is a lack of strength. It may be felt all over the body (generalized) or in one specific part of the body (focal). Some causes of weakness can be serious. You may need further medical evaluation, especially if you are elderly or you have a history of immunosuppression (such as chemotherapy or HIV), kidney disease, heart disease, or diabetes. °CAUSES  °Weakness can be caused by many different things, including: °· Infection. °· Physical exhaustion. °· Internal bleeding or other blood loss that results in a lack of red blood cells (anemia). °· Dehydration. This cause is more common in elderly people. °· Side effects or electrolyte abnormalities from medicines, such as pain medicines or sedatives. °· Emotional distress, anxiety, or depression. °· Circulation problems, especially severe peripheral arterial disease. °· Heart disease, such as rapid atrial fibrillation, bradycardia, or heart failure. °· Nervous system disorders, such as Guillain-Barré syndrome, multiple sclerosis, or stroke. °DIAGNOSIS  °To find the cause of your weakness, your caregiver will take your history and perform a physical exam. Lab tests or X-rays may also be ordered, if needed. °TREATMENT  °Treatment of weakness depends on the cause of your symptoms and can vary greatly. °HOME CARE INSTRUCTIONS  °· Rest as needed. °· Eat a well-balanced diet. °· Try to get some exercise every day. °· Only take over-the-counter or prescription medicines as directed by your caregiver. °SEEK MEDICAL CARE IF:  °· Your weakness seems to be getting worse or spreads to other parts of your body. °· You develop new aches or pains. °SEEK IMMEDIATE MEDICAL CARE IF:  °· You cannot perform your normal daily activities, such as getting dressed and feeding yourself. °· You cannot walk up and down stairs, or you feel exhausted when you do so. °· You have shortness of breath or chest pain. °· You have difficulty moving parts of your body. °· You have weakness  in only one area of the body or on only one side of the body. °· You have a fever. °· You have trouble speaking or swallowing. °· You cannot control your bladder or bowel movements. °· You have black or bloody vomit or stools. °MAKE SURE YOU: °· Understand these instructions. °· Will watch your condition. °· Will get help right away if you are not doing well or get worse. °This information is not intended to replace advice given to you by your health care provider. Make sure you discuss any questions you have with your health care provider. °Document Released: 06/02/2005 Document Revised: 12/02/2011 Document Reviewed: 03/23/2015 °Elsevier Interactive Patient Education © 2017 [employer]vier Inc. ° °

## 2016-06-01 NOTE — Progress Notes (Signed)
Patient left unit by [facility] transportation with all belongings.

## 2016-06-01 NOTE — NC FL2 (Signed)
[place] MEDICAID FL2 LEVEL OF CARE SCREENING TOOL     IDENTIFICATION  Patient Name: [name] Birthdate: [birth year] Sex: female Admission Date (Current Location): 05/30/2016  [facility] [place]   Facility and Address:  [facility] [place], [address], [place], [place] [address]      Provider Number: [id number]  Attending Physician Name and Address:  Edsel PetrinMaryann Mikhail, DO  Relative Name and Phone Number:       Current Level of Care: Hospital Recommended Level of Care: Skilled Nursing Facility Prior Approval Number:    Date Approved/Denied:   PASRR Number: [id number]  Discharge Plan: SNF    Current Diagnoses: Patient Active Problem List   Diagnosis Date Noted  . Depression with anxiety 05/30/2016  . 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 Generalized weakness 05/30/2016  . Left arm weakness 05/30/2016  . Left Lower extremity pain  04/02/2016  . Lower extremity pain, inferior, left 04/02/2016  . Left leg pain   . Personal history of noncompliance with medical treatment, presenting hazards to health 03/31/2016  . Jaw pain 03/27/2016  . Left leg cellulitis 03/24/2016  . Cellulitis of left lower extremity   . Primary insomnia   . Adjustment disorder   . Morbid obesity (HCC) 01/27/2016  . Wheelchair dependent 01/27/2016  . Gout 01/27/2016  . Asthma 01/27/2016  . Arthritis 01/27/2016  . Uncontrolled diabetes mellitus type 2 with peripheral artery disease (HCC) 01/25/2016  . Cellulitis of left leg 01/11/2016  . Insulin-requiring or dependent type II diabetes mellitus (HCC) 01/11/2016  . Hypertension 01/11/2016    Orientation RESPIRATION BLADDER Height & Weight     Self, Time, Situation, Place  Normal Continent Weight: (!) 143.7 kg (316 lb 11.2 oz) Height:  5\' 2"  (157.5 cm)  BEHAVIORAL SYMPTOMS/MOOD NEUROLOGICAL BOWEL NUTRITION STATUS   (NONE)  (NONE) Continent Diet  AMBULATORY STATUS COMMUNICATION OF  NEEDS Skin   Extensive Assist Verbally Normal                       Personal Care Assistance Level of Assistance  Bathing, Feeding, Dressing Bathing Assistance: Limited assistance Feeding assistance: Independent Dressing Assistance: Limited assistance     Functional Limitations Info  Sight, Hearing, Speech Sight Info: Adequate Hearing Info: Adequate Speech Info: Adequate    SPECIAL CARE FACTORS FREQUENCY  PT (By licensed PT), OT (By licensed OT)     PT Frequency: 5/week OT Frequency: 5/week            Contractures Contractures Info: Not present    Additional Factors Info  Allergies, Code Status, Insulin Sliding Scale Code Status Info: Full Allergies Info: Ace Inhibitors, Metformin And Related   Insulin Sliding Scale Info: 3/day       Current Medications (06/01/2016):  This is the current hospital active medication list Current Facility-Administered Medications  Medication Dose Route Frequency Provider Last Rate Last Dose  . acetaminophen (TYLENOL) tablet 650 mg  650 mg Oral Q4H PRN Briscoe Deutscherimothy S Opyd, MD   650 mg at 06/01/16 0530   Or  . acetaminophen (TYLENOL) solution 650 mg  650 mg Per Tube Q4H PRN Briscoe Deutscherimothy S Opyd, MD       Or  . acetaminophen (TYLENOL) suppository 650 mg  650 mg Rectal Q4H PRN Lavone Neriimothy S Opyd, MD      . albuterol (PROVENTIL) (2.5 MG/3ML) 0.083% nebulizer solution 3 mL  3 mL Inhalation Q6H PRN Briscoe Deutscherimothy S Opyd, MD      .  allopurinol (ZYLOPRIM) tablet 300 mg  300 mg Oral Daily Briscoe Deutscherimothy S Opyd, MD   300 mg at 06/01/16 0813  . aspirin tablet 325 mg  325 mg Oral Daily Briscoe Deutscherimothy S Opyd, MD   325 mg at 06/01/16 0813  . atenolol (TENORMIN) tablet 25 mg  25 mg Oral QHS Juliette MangleVeronda P Bryk, RPH   25 mg at 05/31/16 2213  . atenolol (TENORMIN) tablet 50 mg  50 mg Oral Daily Briscoe Deutscherimothy S Opyd, MD   50 mg at 06/01/16 0813  . clonazePAM (KLONOPIN) tablet 0.5 mg  0.5 mg Oral BID PRN Briscoe Deutscherimothy S Opyd, MD   0.5 mg at 05/31/16 1109  . diclofenac sodium (VOLTAREN) 1 %  transdermal gel 4 g  4 g Topical QID PRN Lavone Neriimothy S Opyd, MD      . enoxaparin (LOVENOX) injection 40 mg  40 mg Subcutaneous Q24H Briscoe Deutscherimothy S Opyd, MD   40 mg at 06/01/16 0814  . gabapentin (NEURONTIN) capsule 300 mg  300 mg Oral BID Briscoe Deutscherimothy S Opyd, MD   300 mg at 06/01/16 0814  . insulin aspart (novoLOG) injection 0-5 Units  0-5 Units Subcutaneous QHS Timothy S Opyd, MD      . insulin aspart (novoLOG) injection 0-9 Units  0-9 Units Subcutaneous TID WC Briscoe Deutscherimothy S Opyd, MD   1 Units at 06/01/16 0612  . insulin aspart (novoLOG) injection 3 Units  3 Units Subcutaneous TID WC Briscoe Deutscherimothy S Opyd, MD   3 Units at 06/01/16 0815  . insulin glargine (LANTUS) injection 70 Units  70 Units Subcutaneous Daily Briscoe Deutscherimothy S Opyd, MD   70 Units at 06/01/16 [phone]  . methocarbamol (ROBAXIN) tablet 500 mg  500 mg Oral Q6H PRN Kathlen ModyVijaya Akula, MD   500 mg at 05/31/16 2213  . NIFEdipine (PROCARDIA XL/ADALAT-CC) 24 hr tablet 90 mg  90 mg Oral Daily Briscoe Deutscherimothy S Opyd, MD   90 mg at 06/01/16 0827  . protein supplement (PREMIER PROTEIN) liquid  11 oz Oral Q24H Kathlen ModyVijaya Akula, MD   11 oz at 05/31/16 2214  . senna-docusate (Senokot-S) tablet 1 tablet  1 tablet Oral QHS PRN Briscoe Deutscherimothy S Opyd, MD         Discharge Medications: Please see discharge summary for a list of discharge medications.  Relevant Imaging Results:  Relevant Lab Results:   Additional Information SS# [id number]  Venita LickCampbell, Abdirizak Richison B, LCSW

## 2016-06-01 NOTE — Clinical Social Work Placement (Signed)
   CLINICAL SOCIAL WORK PLACEMENT  NOTE  Date:  06/01/2016  Patient Details  Name: [name] MRN: [id number] Date of Birth: 05/09/1951  Clinical Social Work is seeking post-discharge placement for this patient at the Skilled  Nursing Facility level of care (*CSW will initial, date and re-position this form in  chart as items are completed):  Yes   Patient/family provided with [facility] Clinical Social Work Department's list of facilities offering this level of care within the geographic area requested by the patient (or if unable, by the patient's family).  Yes   Patient/family informed of their freedom to choose among providers that offer the needed level of care, that participate in Medicare, Medicaid or managed care program needed by the patient, have an available bed and are willing to accept the patient.  Yes   Patient/family informed of 's ownership interest in [facility] and [facility], as well as of the fact that they are under no obligation to receive care at these facilities.  PASRR submitted to EDS on       PASRR number received on       Existing PASRR number confirmed on 06/01/16     FL2 transmitted to all facilities in geographic area requested by pt/family on 06/01/16     FL2 transmitted to all facilities within larger geographic area on       Patient informed that his/her managed care company has contracts with or will negotiate with certain facilities, including the following:        Yes   Patient/family informed of bed offers received.  Patient [name]     Physician recommends and patient chooses bed at      [facility] on 06/01/16.  Patient to be transferred to facility by Ambulance     Patient family notified on 06/01/16 of transfer.  Name of family member notified:  Doyle Askewsekiel (husband)     PHYSICIAN Please prepare priority discharge summary,  including medications, Please prepare prescriptions, Please sign FL2     Additional Comment:  [facility] is accepting patient with 5 day LOG pending [employer] Medicare auth approved by physician advisor.   _______________________________________________ Venita Lickampbell, Alexea Blase B, LCSW 06/01/2016, 2:42 PM

## 2016-06-01 NOTE — Progress Notes (Signed)
Report given to Lupita LeashDonna at [facility].  All questions and concerns addressed.

## 2016-06-01 NOTE — Discharge Summary (Signed)
Physician Discharge Summary  [name] UKG:[id number] DOB: 28-Jun-1950 DOA: 05/30/2016  PCP: Phineas Inches, MD  Admit date: 05/30/2016 Discharge date: 06/01/2016  Time spent: 45 minutes  Recommendations for Outpatient Follow-up:  Patient will be discharged to skilled nursing facility, continue physical and occupational therapy.  Patient will need to follow up with primary care provider within one week of discharge. Patient may benefit from rheumatology referral.  Patient should continue medications as prescribed.  Patient should follow a heart healthy/carb modified diet.   Discharge Diagnoses:  Left arm weakness/generalized weakness Diabetes mellitus, type II with neuropathy Hypertension Anxiety Gout  Discharge Condition: Stable  Diet recommendation: Heart healthy/carb modified  Filed Weights   05/30/16 1716 05/30/16 2344  Weight: (!) 149.7 kg (330 lb) (!) 143.7 kg (316 lb 11.2 oz)    History of present illness:  On 05/30/2016 by Dr. Christia Reading Opyd [name] is a right-handed 65 y.o. female with medical history significant for insulin-dependent diabetes mellitus, hypertension, gout, anxiety, chronic left lower leg wound, and TIA who presents to the ED with 3 weeks of generalized weakness and left arm weakness. Patient reports being discharged from a skilled nursing facility approximately 1 month ago and initially did well back at home before developing a generalized weakness approximately 3 weeks ago. This has progressively worsened in the ensuing days and patient reports that she is now unable to get out of bed or perform her ADLs. Over the same interval, she has noted particular weakness involving the left arm and left hand. She reports that there appeared to be some swelling involving the distal left arm and pain that has waxed and waned over the past 3 weeks, but the arm and hand have remained weak relative to the right side. Patient denies any fall or trauma and denies any  change in vision or hearing or loss of coordination. She denies any focal numbness or tingling. She denies any recent fevers, chest pain, palpitations, cough, or dyspnea. She has just completed a course of doxycycline for a cellulitis involving the left lower leg surrounding a chronic ulcer.  Hospital Course:  Left arm weakness/Generalized weakness -CT head and MRI unremarkable for acute abnormalities -B12 543, TSH 1.675, Ft4 0.79  -pending ESR -PT and OT consulted and recommended SNF -Social work consulted and appreciated -Patient may benefit from rheumatology referral by her primary care physician upon discharge  Diabetes mellitus, type II with neuropathy -Hemoglobin A1c 8.6 -Continue her medications upon discharge: insulin glulisine 15 units BID and Tresiba 75 units qAM -Continue to monitor CBGs -Continue gabapentin for neuropathy  Hypertension  -Continue Tenormin, nifedipine  Anxiety  -Continue Klonopin  Gout -Continue allopurinol  Procedures: None  Consultations: None  Discharge Exam: Vitals:   06/01/16 0518 06/01/16 1001  BP: (!) 117/94 130/62  Pulse: 79 78  Resp: 18 20  Temp: 98.1 F (36.7 C) 97.4 F (36.3 C)   Patient states her weakness is improving a little bit however continues to complain of pain in multiple areas of her body including her shoulders hands and legs. Denies any chest pain or shortness of breath, nausea or vomiting, abdominal pain, dizziness or headache.   General: Well developed, well nourished, NAD, appears stated age  HEENT: NCAT,  mucous membranes moist.  Cardiovascular: S1 S2 auscultated, no rubs, murmurs or gallops. Regular rate and rhythm.  Respiratory: Clear to auscultation bilaterally with equal chest rise  Abdomen: Soft, obese, nontender, nondistended, + bowel sounds  Extremities: warm dry without cyanosis clubbing or edema  Neuro: AAOx3, nonfocal   Discharge Instructions Discharge Instructions    Discharge  instructions    Complete by:  As directed    Patient will be discharged to skilled nursing facility, continue physical and occupational therapy.  Patient will need to follow up with primary care provider within one week of discharge. Patient may benefit from rheumatology referral.  Patient should continue medications as prescribed.  Patient should follow a heart healthy/carb modified diet.     Current Discharge Medication List    CONTINUE these medications which have CHANGED   Details  clonazePAM (KLONOPIN) 0.5 MG tablet Take 1 tablet (0.5 mg total) by mouth daily as needed for anxiety. Qty: 10 tablet, Refills: 0   Associated Diagnoses: Anxiety disorder      CONTINUE these medications which have NOT CHANGED   Details  albuterol (PROVENTIL HFA;VENTOLIN HFA) 108 (90 Base) MCG/ACT inhaler Inhale 2 puffs into the lungs every 6 (six) hours as needed for wheezing or shortness of breath.    allopurinol (ZYLOPRIM) 300 MG tablet Take 300 mg by mouth daily.    aspirin 325 MG tablet Take 325 mg by mouth daily.    atenolol (TENORMIN) 25 MG tablet Take 25-50 mg by mouth 2 (two) times daily. Take 50 mg every morning and then 25 mg every evening    bisacodyl (DULCOLAX) 5 MG EC tablet Take 5 mg by mouth daily as needed for moderate constipation.    cyclobenzaprine (FLEXERIL) 10 MG tablet Take 10 mg by mouth 3 (three) times daily as needed for muscle spasms.    diclofenac sodium (VOLTAREN) 1 % GEL Apply 4 g topically 4 (four) times daily. Qty: 1 Tube, Refills: 1   Associated Diagnoses: Chronic knee pain, right    gabapentin (NEURONTIN) 300 MG capsule Take 1 capsule (300 mg total) by mouth 2 (two) times daily. Qty: 60 capsule, Refills: 2    hydrocerin (EUCERIN) CREA Apply 1 application topically daily. Qty: 228 g, Refills: 0    insulin degludec (TRESIBA FLEXTOUCH) 100 UNIT/ML SOPN FlexTouch Pen Inject 0.75 mLs (75 Units total) into the skin daily. Qty: 3 mL, Refills: 1    Insulin Glulisine  (APIDRA SOLOSTAR) 100 UNIT/ML Solostar Pen Inject 15 Units into the skin 2 (two) times daily.    NIFEdipine (PROCARDIA XL/ADALAT-CC) 90 MG 24 hr tablet Take 90 mg by mouth daily.    polyethylene glycol (MIRALAX / GLYCOLAX) packet Take 17 g by mouth daily as needed for moderate constipation.      STOP taking these medications     doxycycline (VIBRAMYCIN) 100 MG capsule        Allergies  Allergen Reactions  . Ace Inhibitors Swelling  . Metformin And Related Other (See Comments)    chills   Follow-up Information    BOUSKA,DAVID E, MD. Schedule an appointment as soon as possible for a visit in 1 week(s).   Specialty:  Family Medicine Why:  Hospital follow up Contact information: [place] [address] [phone]            The results of significant diagnostics from this hospitalization (including imaging, microbiology, ancillary and laboratory) are listed below for reference.    Significant Diagnostic Studies: Dg Chest 2 View  Result Date: 05/30/2016 CLINICAL DATA:  Generalized weakness. Chills for the past 2 weeks. Intermittent central chest pain. Left leg cellulitis for 1 year. EXAM: CHEST  2 VIEW COMPARISON:  None. FINDINGS: Normal sized heart. Clear lungs. Central  peribronchial thickening. Mild thoracic spine degenerative changes. IMPRESSION: Mild to moderate bronchitic changes. Electronically Signed   By: Claudie Revering M.D.   On: 05/30/2016 19:38   Ct Head Wo Contrast  Result Date: 05/30/2016 CLINICAL DATA:  Left arm weakness. Bilateral leg weakness. Generalized weakness. EXAM: CT HEAD WITHOUT CONTRAST TECHNIQUE: Contiguous axial images were obtained from the base of the skull through the vertex without intravenous contrast. COMPARISON:  Maxillofacial CT dated 03/07/2016. FINDINGS: Brain: No evidence of acute infarction, hemorrhage, hydrocephalus, extra-axial collection or mass lesion/mass effect. Vascular: No  hyperdense vessel or unexpected calcification. Skull: Normal. Negative for fracture or focal lesion. Sinuses/Orbits: No acute finding. Other: None. IMPRESSION: Normal examination. Electronically Signed   By: Claudie Revering M.D.   On: 05/30/2016 19:23   Mr Brain Wo Contrast  Result Date: 05/31/2016 CLINICAL DATA:  Generalized weakness beginning about 3 weeks ago, progressively worsening. EXAM: MRI HEAD WITHOUT CONTRAST TECHNIQUE: Multiplanar, multiecho pulse sequences of the brain and surrounding structures were obtained without intravenous contrast. COMPARISON:  Head CT 05/30/2016 FINDINGS: Brain: Diffusion imaging does not show any acute or subacute infarction. There is a non acute small vessel infarction within the right side of the pons. Exact age is indeterminate. No focal cerebellar insult. Cerebral hemispheres show chronic small-vessel ischemic changes within the deep and subcortical white matter, mild moderate in degree. No cortical or large vessel territory infarction. No mass lesion, hemorrhage, hydrocephalus or extra-axial collection. No pituitary mass. Vascular: Major vessels at the base of the brain show flow. Skull and upper cervical spine: Negative Sinuses/Orbits: Clear/normal Other: None significant IMPRESSION: No acute finding by MRI. 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 Nonacute small vessel infarction right pons. Electronically Signed   By: Nelson Chimes M.D.   On: 05/31/2016 13:03   Dg Hand Complete Left  Result Date: 06/01/2016 CLINICAL DATA:  Medial left hand pain and swelling just proximal to the left fifth finger. No known trauma. EXAM: LEFT HAND - COMPLETE 3+ VIEW COMPARISON:  None. FINDINGS: Widening of the scapholunate space on the AP view to 6.5 mm. No acute fractures or dislocations. No abnormality at the site of the patient's pain. IMPRESSION: 1. Widening of the scapholunate joint space suggests previous injury  to the scapholunate ligament. By report, this is not the site of the patient's symptoms today. 2. No fractures or other abnormalities identified. Electronically Signed   By: Dorise Bullion III M.D   On: 06/01/2016 10:34    Microbiology: No results found for this or any previous visit (from the past 240 hour(s)).   Labs: Basic Metabolic Panel:  Recent Labs Lab 05/30/16 1713 06/01/16 1209  NA 140  --   K 3.7  --   CL 105  --   CO2 24  --   GLUCOSE 74  --   BUN 10  --   CREATININE 0.86 0.91  CALCIUM 9.9  --    Liver Function Tests: No results for input(s): AST, ALT, ALKPHOS, BILITOT, PROT, ALBUMIN in the last 168 hours. No results for input(s): LIPASE, AMYLASE in the last 168 hours. No results for input(s): AMMONIA in the last 168 hours. CBC:  Recent Labs Lab 05/30/16 1713  WBC 10.6*  HGB 13.3  HCT 40.9  MCV 89.9  PLT 358   Cardiac Enzymes:  Recent Labs Lab 06/01/16 0255  CKTOTAL 87   BNP: BNP (last 3 results) No results for input(s): BNP in the last 8760 hours.  ProBNP (last 3 results) No results for input(s): PROBNP in the last 8760 hours.  CBG:  Recent Labs Lab 05/31/16 1114 05/31/16 1613 05/31/16 2117 06/01/16 0610 06/01/16 1130  GLUCAP 179* 122* 114* 134* 144*       Signed:  Melissa Pulido  [facility] 06/01/2016, 1:45 PM

## 2016-06-02 LAB — HEMOGLOBIN A1C
Hgb A1c MFr Bld: 8.5 % — ABNORMAL HIGH (ref 4.8–5.6)
Mean Plasma Glucose: 197 mg/dL

## 2016-06-02 LAB — HIV ANTIBODY (ROUTINE TESTING W REFLEX): HIV Screen 4th Generation wRfx: NONREACTIVE

## 2016-06-04 ENCOUNTER — Ambulatory Visit (INDEPENDENT_AMBULATORY_CARE_PROVIDER_SITE_OTHER): Payer: Medicare PPO | Admitting: Vascular Surgery

## 2016-06-04 ENCOUNTER — Encounter: Payer: Self-pay | Admitting: Vascular Surgery

## 2016-06-04 ENCOUNTER — Other Ambulatory Visit: Payer: Self-pay

## 2016-06-04 VITALS — BP 140/78 | Temp 97.5°F | Resp 18 | Ht 62.0 in | Wt 316.0 lb

## 2016-06-04 DIAGNOSIS — M316 Other giant cell arteritis: Secondary | ICD-10-CM

## 2016-06-04 NOTE — Progress Notes (Signed)
Referring Physician: Dr Everlene Other  Patient name: [name] MRN: [id number] DOB: [birth year] Sex: female  REASON FOR CONSULT: temporal artery biopsy  HPI: [name] is a 65 y.o. female, with several week history of vague right sided headaches and right maxilla pain. She denies any real visual changes although she states she had some changes in her eyes a few weeks ago where she felt like she was looking through wet glass. This resolved in less than 24 hours. She does have a history of migraine headaches but has not had one for years. She had a sedimentation rate checked which was 63. She was referred for possible temporal artery biopsy. She currently resides in skilled nursing facility because she is unable to walk. Other medical problems include arthritis, asthma, gout hypertension hyperlipidemia and diabetes all of which are stable.  Past Medical History:  Diagnosis Date  . Anxiety   . Arthritis    "back, arms, legs" (03/24/2016)  . Asthma   . Chronic lower back pain   . Colonic polyp    last colonoscopy done in 2009 with normal results per medical record  . Depressive disorder   . Gastric polyp   . Gout    has taken allopurinol 300mg  once daily in past  . Headache   . History of hiatal hernia   . Hypertension   . Migraine    "none in awhile; might have a couple/year" (03/24/2016)  . Mixed hyperlipidemia    01/2016 Total chol 141, HDL 59, LDL 63, ration 1.1  . Osteoarthritis   . TIA (transient ischemic attack) 11/2014  . Type II diabetes mellitus (HCC)    Past Surgical History:  Procedure Laterality Date  . BREAST BIOPSY Left ~ 2015   benign  . CARPAL TUNNEL RELEASE Bilateral   . DILATION AND CURETTAGE OF UTERUS    . KNEE ARTHROSCOPY Right 2003   in [place]  . LAPAROSCOPIC CHOLECYSTECTOMY    . TUBAL LIGATION    . VAGINAL HYSTERECTOMY  1982    Family History  Problem Relation Age of Onset  . Stroke Father   . Stroke Brother   . Cancer Other     SOCIAL  HISTORY: Social History   Social History  . Marital status: Married    Spouse name: N/A  . Number of children: N/A  . Years of education: N/A   Occupational History  . Not on file.   Social History Main Topics  . Smoking status: Never Smoker  . Smokeless tobacco: Never Used  . Alcohol use No  . Drug use: No  . Sexual activity: No   Other Topics Concern  . Not on file   Social History Narrative   Recently moved from [place]    Allergies  Allergen Reactions  . Ace Inhibitors Swelling  . Metformin And Related Other (See Comments)    chills    Current Outpatient Prescriptions  Medication Sig Dispense Refill  . albuterol (PROVENTIL HFA;VENTOLIN HFA) 108 (90 Base) MCG/ACT inhaler Inhale 2 puffs into the lungs every 6 (six) hours as needed for wheezing or shortness of breath.    . allopurinol (ZYLOPRIM) 300 MG tablet Take 300 mg by mouth daily.    Marland Kitchen aspirin 325 MG tablet Take 325 mg by mouth daily.    Marland Kitchen atenolol (TENORMIN) 25 MG tablet Take 25-50 mg by mouth 2 (two) times daily. Take 50 mg every morning and then 25 mg every evening    . bisacodyl (DULCOLAX) 5  MG EC tablet Take 5 mg by mouth daily as needed for moderate constipation.    . clonazePAM (KLONOPIN) 0.5 MG tablet Take 1 tablet (0.5 mg total) by mouth daily as needed for anxiety. 10 tablet 0  . cyclobenzaprine (FLEXERIL) 10 MG tablet Take 10 mg by mouth 3 (three) times daily as needed for muscle spasms.    . diclofenac sodium (VOLTAREN) 1 % GEL Apply 4 g topically 4 (four) times daily. (Patient taking differently: Apply 4 g topically 4 (four) times daily as needed (pain). ) 1 Tube 1  . gabapentin (NEURONTIN) 300 MG capsule Take 1 capsule (300 mg total) by mouth 2 (two) times daily. 60 capsule 2  . hydrocerin (EUCERIN) CREA Apply 1 application topically daily. 228 g 0  . insulin degludec (TRESIBA FLEXTOUCH) 100 UNIT/ML SOPN FlexTouch Pen Inject 0.75 mLs (75 Units total) into the skin daily. (Patient taking  differently: Inject 75 Units into the skin every evening. ) 3 mL 1  . Insulin Glulisine (APIDRA SOLOSTAR) 100 UNIT/ML Solostar Pen Inject 15 Units into the skin 2 (two) times daily.    Marland Kitchen. NIFEdipine (PROCARDIA XL/ADALAT-CC) 90 MG 24 hr tablet Take 90 mg by mouth daily.    . polyethylene glycol (MIRALAX / GLYCOLAX) packet Take 17 g by mouth daily as needed for moderate constipation.     No current facility-administered medications for this visit.     ROS:   General:  No weight loss, Fever, chills  HEENT: + recent headaches, no nasal bleeding, no visual changes, no sore throat  Neurologic: No dizziness, blackouts, seizures. No recent symptoms of stroke or mini- stroke. No recent episodes of slurred speech, or temporary blindness.  Cardiac: No recent episodes of chest pain/pressure, no shortness of breath at rest.   + shortness of breath with exertion.  Denies history of atrial fibrillation or irregular heartbeat  Vascular: No history of rest pain in feet.  No history of claudication.  No history of non-healing ulcer, No history of DVT   Pulmonary: No home oxygen, no productive cough, no hemoptysis,  No asthma or wheezing  Musculoskeletal:  [X]  Arthritis, [X]  Low back pain,  [X]  Joint pain  Hematologic:No history of hypercoagulable state.  No history of easy bleeding.  No history of anemia  Gastrointestinal: No hematochezia or melena,  No gastroesophageal reflux, no trouble swallowing  Urinary: [ ]  chronic Kidney disease, [ ]  on HD - [ ]  MWF or [ ]  TTHS, [ ]  Burning with urination, [ ]  Frequent urination, [ ]  Difficulty urinating;   Skin: No rashes  Psychological: No history of anxiety,  No history of depression   Physical Examination  Vitals:   06/04/16 1403  BP: 140/78  Resp: 18  Temp: 97.5 F (36.4 C)  TempSrc: Oral  SpO2: 97%  Weight: (!) 316 lb (143.3 kg)  Height: 5\' 2"  (1.575 m)    Body mass index is 57.8 kg/m.  General:  Alert and oriented, no acute  distress HEENT: Normal Neck: No bruit or JVD Pulmonary: Clear to auscultation bilaterally Cardiac: Regular Rate and Rhythm without murmur Abdomen: Soft, non-tender, non-distended, obese Skin: No rash Extremity Pulses:  2+ radial, brachial, pulses bilaterally Musculoskeletal: No deformity or edema  Neurologic: Upper and lower extremity motor 5/5 and symmetric   ASSESSMENT:  Needs right temporal artery biopsy to rule out temporal arteritis   PLAN:  Right temporal artery biopsy 06/06/2016.  Risks benefits possible, the patient's procedure details were explained to the patient today including not  limited to bleeding infection she understands and agrees to proceed   Fabienne Brunsharles Fields, MD [facility] Office: [phone] Pager: [phone]

## 2016-06-05 ENCOUNTER — Telehealth: Payer: Self-pay | Admitting: Vascular Surgery

## 2016-06-05 NOTE — Telephone Encounter (Signed)
Patient was scheduled for temporal artery biopsy on 06/06/2016. She was seen in preoperative clinic today. She has now decided that she does not wish to have the temporal biopsy performed. We will be happy to reschedule at some point in the future if she wishes to proceed.  Fabienne Brunsharles Fields, MD [facility] [place] Office: [phone] Pager: [phone]

## 2016-06-05 NOTE — Progress Notes (Signed)
Spoke with Verlee Monteora, [facility]. Verlee MonteDora states pt just told her about 20 minutes ago that she has decided to cancel the surgery. Verlee MonteDora states that pt's husband told her that he was calling [facility] office to let them know. I called and spoke with Judeth CornfieldStephanie, RN at [facility] and she states she has not heard this, but will let Dr. Darrick PennaFields know and surgery will be cancelled.

## 2016-06-06 ENCOUNTER — Encounter (HOSPITAL_COMMUNITY): Admission: RE | Payer: Self-pay | Source: Ambulatory Visit

## 2016-06-06 ENCOUNTER — Ambulatory Visit (HOSPITAL_COMMUNITY): Admission: RE | Admit: 2016-06-06 | Payer: Medicare PPO | Source: Ambulatory Visit | Admitting: Vascular Surgery

## 2016-06-06 SURGERY — BIOPSY TEMPORAL ARTERY
Anesthesia: Choice | Laterality: Right

## 2016-06-25 ENCOUNTER — Other Ambulatory Visit: Payer: Self-pay | Admitting: Family Medicine

## 2016-06-25 DIAGNOSIS — R5381 Other malaise: Secondary | ICD-10-CM

## 2016-07-04 ENCOUNTER — Observation Stay (HOSPITAL_COMMUNITY)
Admission: EM | Admit: 2016-07-04 | Discharge: 2016-07-07 | Disposition: A | Discharge status: Home / Self Care | Payer: Medicare PPO | Attending: Internal Medicine | Admitting: Internal Medicine

## 2016-07-04 ENCOUNTER — Encounter (HOSPITAL_COMMUNITY): Payer: Self-pay

## 2016-07-04 DIAGNOSIS — E1142 Type 2 diabetes mellitus with diabetic polyneuropathy: Secondary | ICD-10-CM | POA: Diagnosis not present

## 2016-07-04 DIAGNOSIS — M545 Low back pain: Secondary | ICD-10-CM | POA: Insufficient documentation

## 2016-07-04 DIAGNOSIS — Z6841 Body Mass Index (BMI) 40.0 and over, adult: Secondary | ICD-10-CM | POA: Insufficient documentation

## 2016-07-04 DIAGNOSIS — E11649 Type 2 diabetes mellitus with hypoglycemia without coma: Secondary | ICD-10-CM | POA: Diagnosis present

## 2016-07-04 DIAGNOSIS — F5101 Primary insomnia: Secondary | ICD-10-CM | POA: Diagnosis not present

## 2016-07-04 DIAGNOSIS — E1151 Type 2 diabetes mellitus with diabetic peripheral angiopathy without gangrene: Secondary | ICD-10-CM | POA: Diagnosis not present

## 2016-07-04 DIAGNOSIS — M1990 Unspecified osteoarthritis, unspecified site: Secondary | ICD-10-CM | POA: Diagnosis not present

## 2016-07-04 DIAGNOSIS — Z7982 Long term (current) use of aspirin: Secondary | ICD-10-CM | POA: Insufficient documentation

## 2016-07-04 DIAGNOSIS — J45909 Unspecified asthma, uncomplicated: Secondary | ICD-10-CM | POA: Insufficient documentation

## 2016-07-04 DIAGNOSIS — G8929 Other chronic pain: Secondary | ICD-10-CM | POA: Insufficient documentation

## 2016-07-04 DIAGNOSIS — K449 Diaphragmatic hernia without obstruction or gangrene: Secondary | ICD-10-CM | POA: Insufficient documentation

## 2016-07-04 DIAGNOSIS — E782 Mixed hyperlipidemia: Secondary | ICD-10-CM | POA: Insufficient documentation

## 2016-07-04 DIAGNOSIS — G43909 Migraine, unspecified, not intractable, without status migrainosus: Secondary | ICD-10-CM | POA: Diagnosis not present

## 2016-07-04 DIAGNOSIS — E1165 Type 2 diabetes mellitus with hyperglycemia: Secondary | ICD-10-CM | POA: Insufficient documentation

## 2016-07-04 DIAGNOSIS — M109 Gout, unspecified: Secondary | ICD-10-CM | POA: Diagnosis not present

## 2016-07-04 DIAGNOSIS — L0390 Cellulitis, unspecified: Secondary | ICD-10-CM | POA: Diagnosis present

## 2016-07-04 DIAGNOSIS — Z823 Family history of stroke: Secondary | ICD-10-CM | POA: Diagnosis not present

## 2016-07-04 DIAGNOSIS — I10 Essential (primary) hypertension: Secondary | ICD-10-CM | POA: Insufficient documentation

## 2016-07-04 DIAGNOSIS — F418 Other specified anxiety disorders: Secondary | ICD-10-CM | POA: Diagnosis not present

## 2016-07-04 DIAGNOSIS — L03116 Cellulitis of left lower limb: Principal | ICD-10-CM | POA: Insufficient documentation

## 2016-07-04 DIAGNOSIS — Z993 Dependence on wheelchair: Secondary | ICD-10-CM | POA: Insufficient documentation

## 2016-07-04 DIAGNOSIS — Z794 Long term (current) use of insulin: Secondary | ICD-10-CM | POA: Insufficient documentation

## 2016-07-04 DIAGNOSIS — Z86010 Personal history of colonic polyps: Secondary | ICD-10-CM | POA: Insufficient documentation

## 2016-07-04 DIAGNOSIS — Z8719 Personal history of other diseases of the digestive system: Secondary | ICD-10-CM | POA: Insufficient documentation

## 2016-07-04 DIAGNOSIS — M6281 Muscle weakness (generalized): Secondary | ICD-10-CM

## 2016-07-04 DIAGNOSIS — Z8673 Personal history of transient ischemic attack (TIA), and cerebral infarction without residual deficits: Secondary | ICD-10-CM | POA: Insufficient documentation

## 2016-07-04 DIAGNOSIS — IMO0002 Reserved for concepts with insufficient information to code with codable children: Secondary | ICD-10-CM | POA: Diagnosis present

## 2016-07-04 DIAGNOSIS — M25539 Pain in unspecified wrist: Secondary | ICD-10-CM

## 2016-07-04 LAB — CBC WITH DIFFERENTIAL/PLATELET
BASOS PCT: 0 %
Basophils Absolute: 0 10*3/uL (ref 0.0–0.1)
Eosinophils Absolute: 0.4 10*3/uL (ref 0.0–0.7)
Eosinophils Relative: 5 %
HEMATOCRIT: 38.1 % (ref 36.0–46.0)
HEMOGLOBIN: 12.3 g/dL (ref 12.0–15.0)
LYMPHS ABS: 1.9 10*3/uL (ref 0.7–4.0)
Lymphocytes Relative: 21 %
MCH: 28.9 pg (ref 26.0–34.0)
MCHC: 32.3 g/dL (ref 30.0–36.0)
MCV: 89.6 fL (ref 78.0–100.0)
Monocytes Absolute: 0.4 10*3/uL (ref 0.1–1.0)
Monocytes Relative: 5 %
NEUTROS ABS: 6.2 10*3/uL (ref 1.7–7.7)
NEUTROS PCT: 69 %
Platelets: 359 10*3/uL (ref 150–400)
RBC: 4.25 MIL/uL (ref 3.87–5.11)
RDW: 15.8 % — ABNORMAL HIGH (ref 11.5–15.5)
WBC: 9.1 10*3/uL (ref 4.0–10.5)

## 2016-07-04 LAB — COMPREHENSIVE METABOLIC PANEL
ALT: 13 U/L — ABNORMAL LOW (ref 14–54)
ANION GAP: 7 (ref 5–15)
AST: 15 U/L (ref 15–41)
Albumin: 3 g/dL — ABNORMAL LOW (ref 3.5–5.0)
Alkaline Phosphatase: 100 U/L (ref 38–126)
BUN: 10 mg/dL (ref 6–20)
CHLORIDE: 107 mmol/L (ref 101–111)
CO2: 26 mmol/L (ref 22–32)
CREATININE: 0.8 mg/dL (ref 0.44–1.00)
Calcium: 9.7 mg/dL (ref 8.9–10.3)
Glucose, Bld: 101 mg/dL — ABNORMAL HIGH (ref 65–99)
Potassium: 3.9 mmol/L (ref 3.5–5.1)
SODIUM: 140 mmol/L (ref 135–145)
Total Bilirubin: 0.4 mg/dL (ref 0.3–1.2)
Total Protein: 8.2 g/dL — ABNORMAL HIGH (ref 6.5–8.1)

## 2016-07-04 LAB — I-STAT CG4 LACTIC ACID, ED: Lactic Acid, Venous: 0.84 mmol/L (ref 0.5–1.9)

## 2016-07-04 MED ORDER — VANCOMYCIN HCL IN DEXTROSE 1-5 GM/200ML-% IV SOLN
1000.0000 mg | Freq: Once | INTRAVENOUS | Status: AC
  Administered 2016-07-05: 1000 mg via INTRAVENOUS
  Filled 2016-07-04: qty 200

## 2016-07-04 MED ORDER — PIPERACILLIN-TAZOBACTAM 3.375 G IVPB 30 MIN
3.3750 g | Freq: Once | INTRAVENOUS | Status: AC
  Administered 2016-07-04: 3.375 g via INTRAVENOUS
  Filled 2016-07-04: qty 50

## 2016-07-04 NOTE — ED Provider Notes (Signed)
[facility]-EMERGENCY DEPT Provider Note   CSN: [id number] Arrival date & time: 07/04/16  1807  By signing my name below, I, Modena Jansky, attest that this documentation has been prepared under the direction and in the presence of Devoria Albe, MD . Electronically Signed: Modena Jansky, Scribe. 07/04/2016. 11:10 PM.  Time seen 23:12 PM  History   Chief Complaint Chief Complaint  Patient presents with  . Leg Pain  . Wound Infection   The history is provided by the patient. No language interpreter was used.   HPI Comments: [name] is a 66 y.o. female who presents to the Emergency Department complaining of a LLE wound that occurred about 3-4 weeks ago. She admits to a prior hx of cellulitis in the LLE (same as today's) about a year ago needing hospital admission. She states she was discharged from [facility] for weakness on 06/16/16. She states while in [facility] she had dry scaling skin on her LLE and they started using a cream on her leg. She states it then started getting red and swollen. Before she left they started spraying her leg with diluted bleach because her leg had an odor but wasn't draining. She reports increasing pain and difficulty ambulating on the leg over the past couple of days.  She has started to wrap her leg b/o drainage last week. She states she was doing better immediately following rehab. She denies any nausea, vomiting, fever, but has started having chills and drainage from the wound. She denies hx of alcohol use/smoking.   PCP: Aura Dials, MD  Past Medical History:  Diagnosis Date  . Anxiety   . Arthritis    "back, arms, legs" (03/24/2016)  . Asthma   . Chronic lower back pain   . Colonic polyp    last colonoscopy done in 2009 with normal results per medical record  . Depressive disorder   . Gastric polyp   . Gout    has taken allopurinol 300mg  once daily in past  . Headache   . History of hiatal hernia   . Hypertension   .  Migraine    "none in awhile; might have a couple/year" (03/24/2016)  . Mixed hyperlipidemia    01/2016 Total chol 141, HDL 59, LDL 63, ration 1.1  . Osteoarthritis   . TIA (transient ischemic attack) 11/2014  . Type II diabetes mellitus [facility])     Patient Active Problem List   Diagnosis Date Noted  . Depression with anxiety 05/30/2016  . 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 Generalized weakness 05/30/2016  . Left arm weakness 05/30/2016  . Left Lower extremity pain  04/02/2016  . Lower extremity pain, inferior, left 04/02/2016  . Left leg pain   . Personal history of noncompliance with medical treatment, presenting hazards to health 03/31/2016  . Jaw pain 03/27/2016  . Left leg cellulitis 03/24/2016  . Cellulitis of left lower extremity   . Primary insomnia   . Adjustment disorder   . Morbid obesity (HCC) 01/27/2016  . Wheelchair dependent 01/27/2016  . Gout 01/27/2016  . Asthma 01/27/2016  . Arthritis 01/27/2016  . Uncontrolled diabetes mellitus type 2 with peripheral artery disease (HCC) 01/25/2016  . Cellulitis of left leg 01/11/2016  . Insulin-requiring or dependent type II diabetes mellitus (HCC) 01/11/2016  . Hypertension 01/11/2016    Past Surgical History:  Procedure Laterality Date  . BREAST BIOPSY Left ~ 2015   benign  . CARPAL TUNNEL RELEASE Bilateral   . DILATION  AND CURETTAGE OF UTERUS    . KNEE ARTHROSCOPY Right 2003   in [place]  . LAPAROSCOPIC CHOLECYSTECTOMY    . TUBAL LIGATION    . VAGINAL HYSTERECTOMY  1982    OB History    No data available       Home Medications    Prior to Admission medications   Medication Sig Start Date End Date Taking? Authorizing Provider  albuterol (PROVENTIL HFA;VENTOLIN HFA) 108 (90 Base) MCG/ACT inhaler Inhale 2 puffs into the lungs every 6 (six) hours as needed for wheezing or shortness of breath.    Historical Provider, MD  allopurinol (ZYLOPRIM) 300 MG tablet Take 300 mg by mouth daily.    Historical  Provider, MD  aspirin 325 MG tablet Take 325 mg by mouth daily.    Historical Provider, MD  atenolol (TENORMIN) 25 MG tablet Take 25-50 mg by mouth 2 (two) times daily. Take 50 mg every morning and then 25 mg every evening    Historical Provider, MD  bisacodyl (DULCOLAX) 5 MG EC tablet Take 5 mg by mouth daily as needed for moderate constipation.    Historical Provider, MD  clonazePAM (KLONOPIN) 0.5 MG tablet Take 1 tablet (0.5 mg total) by mouth daily as needed for anxiety. 06/01/16   Maryann Mikhail, DO  cyclobenzaprine (FLEXERIL) 10 MG tablet Take 10 mg by mouth 3 (three) times daily as needed for muscle spasms.    Historical Provider, MD  diclofenac sodium (VOLTAREN) 1 % GEL Apply 4 g topically 4 (four) times daily. Patient taking differently: Apply 4 g topically 4 (four) times daily as needed (pain).  02/25/16   Avanell Shackleton, NP  gabapentin (NEURONTIN) 300 MG capsule Take 1 capsule (300 mg total) by mouth 2 (two) times daily. 01/31/16   Avanell Shackleton, NP  hydrocerin (EUCERIN) CREA Apply 1 application topically daily. 01/15/16   Leroy Sea, MD  insulin degludec (TRESIBA FLEXTOUCH) 100 UNIT/ML SOPN FlexTouch Pen Inject 0.75 mLs (75 Units total) into the skin daily. Patient taking differently: Inject 75 Units into the skin every evening.  03/14/16   Avanell Shackleton, NP  Insulin Glulisine (APIDRA SOLOSTAR) 100 UNIT/ML Solostar Pen Inject 15 Units into the skin 2 (two) times daily.    Historical Provider, MD  NIFEdipine (PROCARDIA XL/ADALAT-CC) 90 MG 24 hr tablet Take 90 mg by mouth daily.    Historical Provider, MD  polyethylene glycol (MIRALAX / GLYCOLAX) packet Take 17 g by mouth daily as needed for moderate constipation.    Historical Provider, MD    Family History Family History  Problem Relation Age of Onset  . Stroke Father   . Stroke Brother   . Cancer Other     Social History Social History  Substance Use Topics  . Smoking status: Never Smoker  . Smokeless tobacco: Never  Used  . Alcohol use No     Allergies   Ace inhibitors and Metformin and related   Review of Systems Review of Systems  All other systems reviewed and are negative.    Physical Exam Updated Vital Signs BP 141/91   Pulse 79   Temp 98.5 F (36.9 C) (Oral)   Resp 18   Ht 5\' 2"  (1.575 m)   Wt (!) 320 lb (145.2 kg)   SpO2 98%   BMI 58.53 kg/m   Vital signs normal    Physical Exam  Constitutional: She is oriented to person, place, and time. She appears well-developed and well-nourished.  Non-toxic appearance. She  does not appear ill. No distress.  HENT:  Head: Normocephalic and atraumatic.  Right Ear: External ear normal.  Left Ear: External ear normal.  Nose: Nose normal. No mucosal edema or rhinorrhea.  Mouth/Throat: Oropharynx is clear and moist and mucous membranes are normal. No dental abscesses or uvula swelling.  Eyes: Conjunctivae and EOM are normal. Pupils are equal, round, and reactive to light.  Neck: Normal range of motion and full passive range of motion without pain. Neck supple.  Cardiovascular: Normal rate, regular rhythm and normal heart sounds.  Exam reveals no gallop and no friction rub.   No murmur heard. Pulmonary/Chest: Effort normal and breath sounds normal. No respiratory distress. She has no wheezes. She has no rhonchi. She has no rales. She exhibits no tenderness and no crepitus.  Abdominal: Normal appearance.  Musculoskeletal: Normal range of motion. She exhibits no edema or tenderness.  Moves all extremities well.   Neurological: She is alert and oriented to person, place, and time. She has normal strength. No cranial nerve deficit.  Skin: Skin is warm, dry and intact. No pallor.  Diffuse enlargement of the left lower leg. Bilateral hyperpigmentation of the lower extremities from the feet almost to the knees. LLE has hyperkeratinization. Left lower leg is warm to touch compared to right. Areas on the dorsum left foot and medial left leg that are  draining. (see photo)  Psychiatric: She has a normal mood and affect. Her speech is normal and behavior is normal. Her mood appears not anxious.  Nursing note and vitals reviewed.                ED Treatments / Results  Labs (all labs ordered are listed, but only abnormal results are displayed) Results for orders placed or performed during the hospital encounter of 07/04/16  Comprehensive metabolic panel  Result Value Ref Range   Sodium 140 135 - 145 mmol/L   Potassium 3.9 3.5 - 5.1 mmol/L   Chloride 107 101 - 111 mmol/L   CO2 26 22 - 32 mmol/L   Glucose, Bld 101 (H) 65 - 99 mg/dL   BUN 10 6 - 20 mg/dL   Creatinine, Ser 4.090.80 0.44 - 1.00 mg/dL   Calcium 9.7 8.9 - 81.110.3 mg/dL   Total Protein 8.2 (H) 6.5 - 8.1 g/dL   Albumin 3.0 (L) 3.5 - 5.0 g/dL   AST 15 15 - 41 U/L   ALT 13 (L) 14 - 54 U/L   Alkaline Phosphatase 100 38 - 126 U/L   Total Bilirubin 0.4 0.3 - 1.2 mg/dL   GFR calc non Af Amer >60 >60 mL/min   GFR calc Af Amer >60 >60 mL/min   Anion gap 7 5 - 15  CBC with Differential  Result Value Ref Range   WBC 9.1 4.0 - 10.5 K/uL   RBC 4.25 3.87 - 5.11 MIL/uL   Hemoglobin 12.3 12.0 - 15.0 g/dL   HCT 91.438.1 78.236.0 - 95.646.0 %   MCV 89.6 78.0 - 100.0 fL   MCH 28.9 26.0 - 34.0 pg   MCHC 32.3 30.0 - 36.0 g/dL   RDW 21.315.8 (H) 08.611.5 - 57.815.5 %   Platelets 359 150 - 400 K/uL   Neutrophils Relative % 69 %   Neutro Abs 6.2 1.7 - 7.7 K/uL   Lymphocytes Relative 21 %   Lymphs Abs 1.9 0.7 - 4.0 K/uL   Monocytes Relative 5 %   Monocytes Absolute 0.4 0.1 - 1.0 K/uL   Eosinophils Relative 5 %  Eosinophils Absolute 0.4 0.0 - 0.7 K/uL   Basophils Relative 0 %   Basophils Absolute 0.0 0.0 - 0.1 K/uL  I-Stat CG4 Lactic Acid, ED  Result Value Ref Range   Lactic Acid, Venous 0.84 0.5 - 1.9 mmol/L   Laboratory interpretation all normal except malnutrition   Procedures Procedures (including critical care time)  Medications Ordered in ED Medications  vancomycin (VANCOCIN) IVPB  1000 mg/200 mL premix (not administered)  piperacillin-tazobactam (ZOSYN) IVPB 3.375 g (3.375 g Intravenous New Bag/Given 07/04/16 2335)     Initial Impression / Assessment and Plan / ED Course  I have reviewed the triage vital signs and the nursing notes.  Pertinent labs & imaging results that were available during my care of the patient were reviewed by me and considered in my medical decision making (see chart for details).     DIAGNOSTIC STUDIES: Oxygen Saturation is 98% on RA, normal by my interpretation.    COORDINATION OF CARE: 11:14 PM- Pt advised of plan for treatment and pt agrees. Patient was recently admitted to the hospital in December and then was in rehabilitation at [facility]. She was started on IV vancomycin and Zosyn for her cellulitis of her left lower leg.  23:54 PM Dr Toniann Fail admit to observation, med-surg  Final Clinical Impressions(s) / ED Diagnoses   Final diagnoses:  Cellulitis of left lower extremity    Plan admission  Devoria Albe, MD, FACEP   I personally performed the services described in this documentation, which was scribed in my presence. The recorded information has been reviewed and considered.  Devoria Albe, MD, Concha Pyo, MD 07/05/16 [phone]

## 2016-07-04 NOTE — ED Triage Notes (Signed)
Pt. Was in [facility] over 3 weeks ago due to weakness.  She has cellulitis and her  Lt. Lower leg developed a wound and has progressively became worse.  The would is red, swollen , drainage, and odor.  Tender to touch. Pt. Was sent by Urgent Care for Iv antibiotics.  Pt. Is alert and oriented X 4.  Skin is warm and dry.  Pt. Is unable to stand uip on the leg.

## 2016-07-05 ENCOUNTER — Encounter (HOSPITAL_COMMUNITY): Payer: Self-pay | Admitting: Internal Medicine

## 2016-07-05 ENCOUNTER — Observation Stay (HOSPITAL_COMMUNITY): Payer: Medicare PPO

## 2016-07-05 DIAGNOSIS — L03116 Cellulitis of left lower limb: Secondary | ICD-10-CM | POA: Diagnosis not present

## 2016-07-05 DIAGNOSIS — L0390 Cellulitis, unspecified: Secondary | ICD-10-CM | POA: Diagnosis present

## 2016-07-05 DIAGNOSIS — E1165 Type 2 diabetes mellitus with hyperglycemia: Secondary | ICD-10-CM | POA: Diagnosis not present

## 2016-07-05 DIAGNOSIS — I10 Essential (primary) hypertension: Secondary | ICD-10-CM

## 2016-07-05 DIAGNOSIS — E1151 Type 2 diabetes mellitus with diabetic peripheral angiopathy without gangrene: Secondary | ICD-10-CM

## 2016-07-05 LAB — BASIC METABOLIC PANEL
Anion gap: 6 (ref 5–15)
BUN: 9 mg/dL (ref 6–20)
CALCIUM: 9.2 mg/dL (ref 8.9–10.3)
CO2: 26 mmol/L (ref 22–32)
Chloride: 107 mmol/L (ref 101–111)
Creatinine, Ser: 0.94 mg/dL (ref 0.44–1.00)
GFR calc Af Amer: >60 mL/min (ref >60)
GLUCOSE: 130 mg/dL — AB (ref 65–99)
Potassium: 3.7 mmol/L (ref 3.5–5.1)
Sodium: 139 mmol/L (ref 135–145)

## 2016-07-05 LAB — CBC
HCT: 35 % — ABNORMAL LOW (ref 36.0–46.0)
Hemoglobin: 11.1 g/dL — ABNORMAL LOW (ref 12.0–15.0)
MCH: 28.5 pg (ref 26.0–34.0)
MCHC: 31.7 g/dL (ref 30.0–36.0)
MCV: 89.7 fL (ref 78.0–100.0)
Platelets: 325 10*3/uL (ref 150–400)
RBC: 3.9 MIL/uL (ref 3.87–5.11)
RDW: 15.8 % — AB (ref 11.5–15.5)
WBC: 7.6 10*3/uL (ref 4.0–10.5)

## 2016-07-05 LAB — CBG MONITORING, ED
GLUCOSE-CAPILLARY: 137 mg/dL — AB (ref 65–99)
Glucose-Capillary: 166 mg/dL — ABNORMAL HIGH (ref 65–99)

## 2016-07-05 LAB — GLUCOSE, CAPILLARY: Glucose-Capillary: 209 mg/dL — ABNORMAL HIGH (ref 65–99)

## 2016-07-05 LAB — I-STAT CG4 LACTIC ACID, ED: LACTIC ACID, VENOUS: 1.06 mmol/L (ref 0.5–1.9)

## 2016-07-05 MED ORDER — ATENOLOL 25 MG PO TABS: 25.0000 mg | ORAL_TABLET | Freq: Two times a day (BID) | ORAL | Status: DC

## 2016-07-05 MED ORDER — ASPIRIN 325 MG PO TABS
325.0000 mg | ORAL_TABLET | Freq: Every day | ORAL | Status: DC
  Administered 2016-07-05 – 2016-07-07 (×3): 325 mg via ORAL
  Filled 2016-07-05 (×3): qty 1

## 2016-07-05 MED ORDER — VANCOMYCIN HCL IN DEXTROSE 1-5 GM/200ML-% IV SOLN: 1000.0000 mg | Freq: Once | INTRAVENOUS | Status: DC

## 2016-07-05 MED ORDER — INSULIN ASPART 100 UNIT/ML ~~LOC~~ SOLN
0.0000 [IU] | Freq: Three times a day (TID) | SUBCUTANEOUS | Status: DC
  Administered 2016-07-05 (×2): 2 [IU] via SUBCUTANEOUS
  Administered 2016-07-06: 1 [IU] via SUBCUTANEOUS
  Filled 2016-07-05 (×2): qty 1

## 2016-07-05 MED ORDER — INSULIN GLARGINE 100 UNIT/ML ~~LOC~~ SOLN
75.0000 [IU] | Freq: Every day | SUBCUTANEOUS | Status: DC
  Administered 2016-07-06 – 2016-07-07 (×2): 75 [IU] via SUBCUTANEOUS
  Filled 2016-07-05 (×3): qty 0.75

## 2016-07-05 MED ORDER — NIFEDIPINE ER OSMOTIC RELEASE 90 MG PO TB24
90.0000 mg | ORAL_TABLET | Freq: Every day | ORAL | Status: DC
  Administered 2016-07-06 – 2016-07-07 (×2): 90 mg via ORAL
  Filled 2016-07-05 (×3): qty 1

## 2016-07-05 MED ORDER — FUROSEMIDE 40 MG PO TABS
40.0000 mg | ORAL_TABLET | Freq: Every day | ORAL | Status: AC
  Administered 2016-07-05 – 2016-07-07 (×3): 40 mg via ORAL
  Filled 2016-07-05: qty 1
  Filled 2016-07-05: qty 2
  Filled 2016-07-05: qty 1

## 2016-07-05 MED ORDER — VANCOMYCIN HCL IN DEXTROSE 750-5 MG/150ML-% IV SOLN
750.0000 mg | Freq: Two times a day (BID) | INTRAVENOUS | Status: DC
  Administered 2016-07-05 – 2016-07-06 (×2): 750 mg via INTRAVENOUS
  Filled 2016-07-05 (×4): qty 150

## 2016-07-05 MED ORDER — BISACODYL 5 MG PO TBEC
5.0000 mg | DELAYED_RELEASE_TABLET | Freq: Every day | ORAL | Status: DC | PRN
  Filled 2016-07-05: qty 1

## 2016-07-05 MED ORDER — ACETAMINOPHEN 325 MG PO TABS: 650.0000 mg | ORAL_TABLET | Freq: Four times a day (QID) | ORAL | Status: DC | PRN

## 2016-07-05 MED ORDER — POTASSIUM CHLORIDE CRYS ER 20 MEQ PO TBCR
20.0000 meq | EXTENDED_RELEASE_TABLET | Freq: Every day | ORAL | Status: DC
  Administered 2016-07-06 – 2016-07-07 (×2): 20 meq via ORAL
  Filled 2016-07-05 (×2): qty 1

## 2016-07-05 MED ORDER — ALBUTEROL SULFATE (2.5 MG/3ML) 0.083% IN NEBU: 3.0000 mL | INHALATION_SOLUTION | Freq: Four times a day (QID) | RESPIRATORY_TRACT | Status: DC | PRN

## 2016-07-05 MED ORDER — ALPRAZOLAM 0.5 MG PO TABS
0.5000 mg | ORAL_TABLET | Freq: Three times a day (TID) | ORAL | Status: DC | PRN
Start: 2016-07-05 — End: 2016-07-07
  Administered 2016-07-05 (×2): 0.5 mg via ORAL
  Filled 2016-07-05 (×2): qty 1

## 2016-07-05 MED ORDER — ATENOLOL 50 MG PO TABS
25.0000 mg | ORAL_TABLET | Freq: Every day | ORAL | Status: DC
  Administered 2016-07-06: 25 mg via ORAL
  Filled 2016-07-05 (×2): qty 1

## 2016-07-05 MED ORDER — PIPERACILLIN-TAZOBACTAM 3.375 G IVPB
3.3750 g | Freq: Three times a day (TID) | INTRAVENOUS | Status: DC
Start: 2016-07-05 — End: 2016-07-06
  Administered 2016-07-05 – 2016-07-06 (×3): 3.375 g via INTRAVENOUS
  Filled 2016-07-05 (×4): qty 50

## 2016-07-05 MED ORDER — CYCLOBENZAPRINE HCL 10 MG PO TABS
10.0000 mg | ORAL_TABLET | Freq: Three times a day (TID) | ORAL | Status: DC | PRN
  Administered 2016-07-05 – 2016-07-07 (×2): 10 mg via ORAL
  Filled 2016-07-05 (×2): qty 1

## 2016-07-05 MED ORDER — ATENOLOL 50 MG PO TABS
50.0000 mg | ORAL_TABLET | Freq: Every day | ORAL | Status: DC
  Administered 2016-07-05 – 2016-07-07 (×3): 50 mg via ORAL
  Filled 2016-07-05: qty 1
  Filled 2016-07-05: qty 2
  Filled 2016-07-05: qty 1

## 2016-07-05 MED ORDER — ONDANSETRON HCL 4 MG/2ML IJ SOLN: 4.0000 mg | Freq: Four times a day (QID) | INTRAMUSCULAR | Status: DC | PRN

## 2016-07-05 MED ORDER — KETOROLAC TROMETHAMINE 15 MG/ML IJ SOLN
15.0000 mg | Freq: Once | INTRAMUSCULAR | Status: AC
  Administered 2016-07-05: 15 mg via INTRAVENOUS
  Filled 2016-07-05: qty 1

## 2016-07-05 MED ORDER — ONDANSETRON HCL 4 MG PO TABS: 4.0000 mg | ORAL_TABLET | Freq: Four times a day (QID) | ORAL | Status: DC | PRN

## 2016-07-05 MED ORDER — ACETAMINOPHEN 650 MG RE SUPP: 650.0000 mg | Freq: Four times a day (QID) | RECTAL | Status: DC | PRN

## 2016-07-05 MED ORDER — ENOXAPARIN SODIUM 40 MG/0.4ML ~~LOC~~ SOLN
40.0000 mg | Freq: Every day | SUBCUTANEOUS | Status: DC
  Administered 2016-07-06 – 2016-07-07 (×2): 40 mg via SUBCUTANEOUS
  Filled 2016-07-05 (×3): qty 0.4

## 2016-07-05 MED ORDER — GABAPENTIN 300 MG PO CAPS
300.0000 mg | ORAL_CAPSULE | Freq: Two times a day (BID) | ORAL | Status: DC
  Administered 2016-07-05 – 2016-07-07 (×5): 300 mg via ORAL
  Filled 2016-07-05 (×5): qty 1

## 2016-07-05 MED ORDER — OXYCODONE-ACETAMINOPHEN 5-325 MG PO TABS
1.0000 | ORAL_TABLET | Freq: Once | ORAL | Status: AC
  Administered 2016-07-05: 1 via ORAL
  Filled 2016-07-05: qty 1

## 2016-07-05 MED ORDER — VANCOMYCIN HCL IN DEXTROSE 1-5 GM/200ML-% IV SOLN
1000.0000 mg | Freq: Once | INTRAVENOUS | Status: AC
  Administered 2016-07-05: 1000 mg via INTRAVENOUS
  Filled 2016-07-05: qty 200

## 2016-07-05 MED ORDER — PIPERACILLIN-TAZOBACTAM 3.375 G IVPB 30 MIN: 3.3750 g | Freq: Once | INTRAVENOUS | Status: DC

## 2016-07-05 MED ORDER — POLYETHYLENE GLYCOL 3350 17 G PO PACK: 17.0000 g | PACK | Freq: Every day | ORAL | Status: DC | PRN

## 2016-07-05 MED ORDER — ALLOPURINOL 300 MG PO TABS
300.0000 mg | ORAL_TABLET | Freq: Every day | ORAL | Status: DC
  Administered 2016-07-05 – 2016-07-07 (×3): 300 mg via ORAL
  Filled 2016-07-05: qty 3
  Filled 2016-07-05 (×2): qty 1

## 2016-07-05 MED ORDER — INSULIN GLULISINE 100 UNIT/ML SOLOSTAR PEN: 15.0000 [IU] | PEN_INJECTOR | Freq: Two times a day (BID) | SUBCUTANEOUS | Status: DC

## 2016-07-05 MED ORDER — CLONAZEPAM 0.5 MG PO TABS: 0.5000 mg | ORAL_TABLET | Freq: Every day | ORAL | Status: DC | PRN

## 2016-07-05 NOTE — ED Notes (Signed)
Checked patient cbg it was [id number] notified RN of blood sugar

## 2016-07-05 NOTE — Progress Notes (Signed)
PROGRESS NOTE                                                                                                                                                                                                             Patient Demographics:    [name], is a 66 y.o. female, DOB - 05-Mar-1951, WUJ:[id number]  Admit date - 07/04/2016   Admitting Physician No admitting provider for patient encounter.  Outpatient Primary MD for the patient is Aura Dials, MD  LOS - 0  Chief Complaint  Patient presents with  . Leg Pain  . Wound Infection       Brief Narrative   [name] is a 66 y.o. female with history of diabetes mellitus type 2, chronic venous stasis, hypertension, gout who was recently admitted to the hospital for weakness and was discharged to rehabilitation presents to the ER because of worsening erythema and blistering of the left lower extremity around the ankle area suspicious for cellulitis.   Subjective:    [name] today has, No headache, No chest pain, No abdominal pain - No Nausea, No new weakness tingling or numbness, No Cough - SOB.     Assessment  & Plan :     1.Left lower extremity cellulitis. Infection is mild, most of her leg changes are due to chronic venous stasis, wound care consulted, will place her on low-dose Lasix, UNNA boots, taper down antibiotics rapidly on 07/06/2016 if cultures are negative and she is nontoxic. Note she has no fever or leukocytosis.  2. History of gout. Currently stable continue allopurinol.  3. Hypertension. On beta blocker, nifedipine, have added diuretic as #1 above will monitor.  4. Diabetic peripheral neuropathy. On Neurontin continue.  5. DM type II. Continue present insulin regimen.  Lab Results  Component Value Date   HGBA1C 8.5 (H) 06/01/2016   CBG (last 3)  No results for input(s): GLUCAP in the last 72 hours.      Family Communication   :  none  Code Status :  Full  Diet : Diet heart healthy/carb modified Room service appropriate? Yes; Fluid consistency: Thin   Disposition Plan  :  Home 1-2 days  Consults  :  W care  Procedures  :    Venous US Legs -   DVT Prophylaxis  :  Lovenox  Lab Results  Component Value Date   PLT 325 07/05/2016    Inpatient Medications  Scheduled Meds: . allopurinol  300 mg Oral Daily  . aspirin  325 mg Oral Daily  . atenolol  50 mg Oral Daily   And  . atenolol  25 mg Oral QHS  . enoxaparin (LOVENOX) injection  40 mg Subcutaneous Daily  . furosemide  40 mg Oral Daily  . gabapentin  300 mg Oral BID  . insulin aspart  0-9 Units Subcutaneous TID WC  . insulin glargine  75 Units Subcutaneous Daily  . Insulin Glulisine  15 Units Subcutaneous BID  . NIFEdipine  90 mg Oral Daily  . potassium chloride  20 mEq Oral Daily  . vancomycin  750 mg Intravenous Q12H   Continuous Infusions: . piperacillin-tazobactam (ZOSYN)  IV     PRN Meds:.acetaminophen **OR** acetaminophen, albuterol, bisacodyl, clonazePAM, cyclobenzaprine, ondansetron **OR** ondansetron (ZOFRAN) IV, polyethylene glycol  Antibiotics  :    Anti-infectives    Start     Dose/Rate Route Frequency Ordered Stop   07/05/16 1000  vancomycin (VANCOCIN) IVPB 750 mg/150 ml premix     750 mg 150 mL/hr over 60 Minutes Intravenous Every 12 hours 07/05/16 0314     07/05/16 0600  piperacillin-tazobactam (ZOSYN) IVPB 3.375 g     3.375 g 12.5 mL/hr over 240 Minutes Intravenous Every 8 hours 07/05/16 0308     07/05/16 0315  vancomycin (VANCOCIN) IVPB 1000 mg/200 mL premix     1,000 mg 200 mL/hr over 60 Minutes Intravenous  Once 07/05/16 0308 07/05/16 0603   07/05/16 0300  piperacillin-tazobactam (ZOSYN) IVPB 3.375 g  Status:  Discontinued     3.375 g 100 mL/hr over 30 Minutes Intravenous  Once 07/05/16 0245 07/05/16 0306   07/05/16 0300  vancomycin (VANCOCIN) IVPB 1000 mg/200 mL premix  Status:  Discontinued     1,000 mg 200  mL/hr over 60 Minutes Intravenous  Once 07/05/16 0245 07/05/16 0306   07/04/16 2330  vancomycin (VANCOCIN) IVPB 1000 mg/200 mL premix     1,000 mg 200 mL/hr over 60 Minutes Intravenous  Once 07/04/16 2319 07/05/16 0129   07/04/16 2330  piperacillin-tazobactam (ZOSYN) IVPB 3.375 g     3.375 g 100 mL/hr over 30 Minutes Intravenous  Once 07/04/16 2319 07/05/16 0028         Objective:   Vitals:   07/05/16 0145 07/05/16 0200 07/05/16 0215 07/05/16 0614  BP: 131/87 129/86 131/84 116/64  Pulse: 75 76 78 77  Resp:    17  Temp:    97.9 F (36.6 C)  TempSrc:    Oral  SpO2: 95% 98% 96% 96%  Weight:      Height:        Wt Readings from Last 3 Encounters:  07/04/16 (!) 145.2 kg (320 lb)  06/04/16 (!) 143.3 kg (316 lb)  05/30/16 (!) 143.7 kg (316 lb 11.2 oz)     Intake/Output Summary (Last 24 hours) at 07/05/16 0908 Last data filed at 07/05/16 0550  Gross per 24 hour  Intake               50 ml  Output              500 ml  Net             -450 ml     Physical Exam  Awake Alert, Oriented X 3, No new F.N deficits, Normal affect [place].AT,PERRAL Supple Neck,No JVD, No cervical lymphadenopathy  appriciated.  Symmetrical Chest wall movement, Good air movement bilaterally, CTAB RRR,No Gallops,Rubs or new Murmurs, No Parasternal Heave +ve B.Sounds, Abd Soft, No tenderness, No organomegaly appriciated, No rebound - guarding or rigidity. No Cyanosis, Clubbing , No new Rash or bruise   Lower extremity venous stasis left more than right with chronic lichenification of the skin, mild warmth in the left leg    Data Review:    CBC  Recent Labs Lab 07/04/16 1848 07/05/16 0328  WBC 9.1 7.6  HGB 12.3 11.1*  HCT 38.1 35.0*  PLT 359 325  MCV 89.6 89.7  MCH 28.9 28.5  MCHC 32.3 31.7  RDW 15.8* 15.8*  LYMPHSABS 1.9  --   MONOABS 0.4  --   EOSABS 0.4  --   BASOSABS 0.0  --     Chemistries   Recent Labs Lab 07/04/16 1848 07/05/16 0328  NA 140 139  K 3.9 3.7  CL 107 107    CO2 26 26  GLUCOSE 101* 130*  BUN 10 9  CREATININE 0.80 0.94  CALCIUM 9.7 9.2  AST 15  --   ALT 13*  --   ALKPHOS 100  --   BILITOT 0.4  --    ------------------------------------------------------------------------------------------------------------------ No results for input(s): CHOL, HDL, LDLCALC, TRIG, CHOLHDL, LDLDIRECT in the last 72 hours.  Lab Results  Component Value Date   HGBA1C 8.5 (H) 06/01/2016   ------------------------------------------------------------------------------------------------------------------ No results for input(s): TSH, T4TOTAL, T3FREE, THYROIDAB in the last 72 hours.  Invalid input(s): FREET3 ------------------------------------------------------------------------------------------------------------------ No results for input(s): VITAMINB12, FOLATE, FERRITIN, TIBC, IRON, RETICCTPCT in the last 72 hours.  Coagulation profile No results for input(s): INR, PROTIME in the last 168 hours.  No results for input(s): DDIMER in the last 72 hours.  Cardiac Enzymes No results for input(s): CKMB, TROPONINI, MYOGLOBIN in the last 168 hours.  Invalid input(s): CK ------------------------------------------------------------------------------------------------------------------ No results found for: BNP  Micro Results No results found for this or any previous visit (from the past 240 hour(s)).  Radiology Reports No results found.  Time Spent in minutes  30   Damiel Barthold K M.D on 07/05/2016 at 9:08 AM  Between 7am to 7pm - Pager - [phone]  After 7pm go to [email or web address] - password [facility]  [facility] -  Office  [phone]

## 2016-07-05 NOTE — Progress Notes (Signed)
Pharmacy Antibiotic Note  [name] is a 66 y.o. female admitted on 07/04/2016 with wound infection.  Pharmacy has been consulted for Vancomycin/Zosyn dosing. WBC WNL. Normalized CrCl is ~75-80.   Plan: -Vancomycin 2000 mg IV x 1, then give 750 mg IV q12h -Zosyn 3.375G IV q8h to be infused over 4 hours -Trend WBC, temp, renal function  -Drug level as indicated   Height: 5\' 2"  (157.5 cm) Weight: (!) 320 lb (145.2 kg) IBW/kg (Calculated) : 50.1  Temp (24hrs), Avg:98.5 F (36.9 C), Min:98.5 F (36.9 C), Max:98.5 F (36.9 C)   Recent Labs Lab 07/04/16 1848 07/04/16 1905 07/05/16 0029  WBC 9.1  --   --   CREATININE 0.80  --   --   LATICACIDVEN  --  0.84 1.06    Estimated Creatinine Clearance: 94.9 mL/min (by C-G formula based on SCr of 0.8 mg/dL).    Allergies  Allergen Reactions  . Ace Inhibitors Swelling  . Metformin And Related Other (See Comments)    chills     Abran DukeLedford, Keymani Glynn 07/05/2016 3:09 AM

## 2016-07-05 NOTE — Progress Notes (Signed)
Orthopedic Tech Progress Note Patient Details:  Sharrell Kuunice Sakuma 1950/09/30 [id number]  Ortho Devices Type of Ortho Device: Roland RackUnna boot Ortho Device/Splint Location: bilateral Ortho Device/Splint Interventions: Application   Nikki DomCrawford, Susano Cleckler 07/05/2016, 12:55 PM

## 2016-07-05 NOTE — H&P (Signed)
History and Physical    [name] Kuunice Musquiz [id number] DOB: 1950-06-23 DOA: 07/04/2016  PCP: Aura DialsBOUSKA,DAVID E, MD  Patient coming from: Home.  Chief Complaint: Left lower extremity erythema and blisters.  HPI: [name] is a 66 y.o. female with history of diabetes mellitus type 2, hypertension, gout who was recently admitted to the hospital for weakness and was discharged to rehabilitation presents to the ER because of worsening erythema and blistering of the left lower extremity around the ankle area. Patient was discharged from the rehabilitation last week and during the stay patient noticed increasing erythema in the left ankle area. Patient was applying bleach as recommended by wound care at the nursing home per the patient. Despite which patient's skin blisters were getting worse with increasing erythema and discharge which was foul-smelling. On exam patient has indurated skin with mild erythema of the left lower extremity extending from the foot to the mid calf. Patient is being admitted for IV antibiotics of developing cellulitis.   ED Course: Patient was started on empiric antibiotics.  Review of Systems: As per HPI, rest all negative.   Past Medical History:  Diagnosis Date  . Anxiety   . Arthritis    "back, arms, legs" (03/24/2016)  . Asthma   . Chronic lower back pain   . Colonic polyp    last colonoscopy done in 2009 with normal results per medical record  . Depressive disorder   . Gastric polyp   . Gout    has taken allopurinol 300mg  once daily in past  . Headache   . History of hiatal hernia   . Hypertension   . Migraine    "none in awhile; might have a couple/year" (03/24/2016)  . Mixed hyperlipidemia    01/2016 Total chol 141, HDL 59, LDL 63, ration 1.1  . Osteoarthritis   . TIA (transient ischemic attack) 11/2014  . Type II diabetes mellitus (HCC)     Past Surgical History:  Procedure Laterality Date  . BREAST BIOPSY Left ~ 2015   benign  . CARPAL TUNNEL  RELEASE Bilateral   . DILATION AND CURETTAGE OF UTERUS    . KNEE ARTHROSCOPY Right 2003   in [place]  . LAPAROSCOPIC CHOLECYSTECTOMY    . TUBAL LIGATION    . VAGINAL HYSTERECTOMY  1982     reports that she has never smoked. She has never used smokeless tobacco. She reports that she does not drink alcohol or use drugs.  Allergies  Allergen Reactions  . Ace Inhibitors Swelling  . Metformin And Related Other (See Comments)    chills    Family History  Problem Relation Age of Onset  . Stroke Father   . Stroke Brother   . Cancer Other     Prior to Admission medications   Medication Sig Start Date End Date Taking? Authorizing Provider  albuterol (PROVENTIL HFA;VENTOLIN HFA) 108 (90 Base) MCG/ACT inhaler Inhale 2 puffs into the lungs every 6 (six) hours as needed for wheezing or shortness of breath.   Yes Historical Provider, MD  allopurinol (ZYLOPRIM) 300 MG tablet Take 300 mg by mouth daily.   Yes Historical Provider, MD  aspirin 325 MG tablet Take 325 mg by mouth daily.   Yes Historical Provider, MD  atenolol (TENORMIN) 25 MG tablet Take 25-50 mg by mouth 2 (two) times daily. Take 50 mg every morning and then 25 mg every evening   Yes Historical Provider, MD  bisacodyl (DULCOLAX) 5 MG EC tablet Take 5 mg by mouth  daily as needed for moderate constipation.   Yes Historical Provider, MD  clonazePAM (KLONOPIN) 0.5 MG tablet Take 1 tablet (0.5 mg total) by mouth daily as needed for anxiety. 06/01/16  Yes Maryann Mikhail, DO  cyclobenzaprine (FLEXERIL) 10 MG tablet Take 10 mg by mouth 3 (three) times daily as needed for muscle spasms.   Yes Historical Provider, MD  diclofenac sodium (VOLTAREN) 1 % GEL Apply 4 g topically 4 (four) times daily. Patient taking differently: Apply 4 g topically 4 (four) times daily as needed (pain).  02/25/16  Yes Avanell Shackleton, NP  gabapentin (NEURONTIN) 300 MG capsule Take 1 capsule (300 mg total) by mouth 2 (two) times daily. 01/31/16  Yes Avanell Shackleton, NP    hydrocerin (EUCERIN) CREA Apply 1 application topically daily. 01/15/16  Yes Leroy Sea, MD  insulin degludec (TRESIBA FLEXTOUCH) 100 UNIT/ML SOPN FlexTouch Pen Inject 0.75 mLs (75 Units total) into the skin daily. Patient taking differently: Inject 75 Units into the skin every evening.  03/14/16  Yes Avanell Shackleton, NP  Insulin Glulisine (APIDRA SOLOSTAR) 100 UNIT/ML Solostar Pen Inject 15 Units into the skin 2 (two) times daily.   Yes Historical Provider, MD  NIFEdipine (PROCARDIA XL/ADALAT-CC) 90 MG 24 hr tablet Take 90 mg by mouth daily.   Yes Historical Provider, MD  polyethylene glycol (MIRALAX / GLYCOLAX) packet Take 17 g by mouth daily as needed for moderate constipation.   Yes Historical Provider, MD    Physical Exam: Vitals:   07/05/16 0130 07/05/16 0145 07/05/16 0200 07/05/16 0215  BP: 126/85 131/87 129/86 131/84  Pulse: 78 75 76 78  Resp:      Temp:      TempSrc:      SpO2: 98% 95% 98% 96%  Weight:      Height:          Constitutional: Moderately built and nourished. Vitals:   07/05/16 0130 07/05/16 0145 07/05/16 0200 07/05/16 0215  BP: 126/85 131/87 129/86 131/84  Pulse: 78 75 76 78  Resp:      Temp:      TempSrc:      SpO2: 98% 95% 98% 96%  Weight:      Height:       Eyes: Anicteric no pallor. ENMT: No discharge from the ears eyes nose or mouth. Neck: No mass felt. No neck rigidity. Respiratory: No rhonchi or crepitations. Cardiovascular: S1-S2 heard no murmurs appreciated. Abdomen: Soft nontender bowel sounds present. Musculoskeletal: Left lower extremity swelling. Skin: Erythema and thickening and induration of the left lower shin the skin extending from the foot to the mid calf. Neurologic: Alert awake oriented to time place and person. Moves all extremities. Psychiatric: Appears normal. Normal affect.   Labs on Admission: I have personally reviewed following labs and imaging studies  CBC:  Recent Labs Lab 07/04/16 1848  WBC 9.1  NEUTROABS  6.2  HGB 12.3  HCT 38.1  MCV 89.6  PLT 359   Basic Metabolic Panel:  Recent Labs Lab 07/04/16 1848  NA 140  K 3.9  CL 107  CO2 26  GLUCOSE 101*  BUN 10  CREATININE 0.80  CALCIUM 9.7   GFR: Estimated Creatinine Clearance: 94.9 mL/min (by C-G formula based on SCr of 0.8 mg/dL). Liver Function Tests:  Recent Labs Lab 07/04/16 1848  AST 15  ALT 13*  ALKPHOS 100  BILITOT 0.4  PROT 8.2*  ALBUMIN 3.0*   No results for input(s): LIPASE, AMYLASE in the last 168  hours. No results for input(s): AMMONIA in the last 168 hours. Coagulation Profile: No results for input(s): INR, PROTIME in the last 168 hours. Cardiac Enzymes: No results for input(s): CKTOTAL, CKMB, CKMBINDEX, TROPONINI in the last 168 hours. BNP (last 3 results) No results for input(s): PROBNP in the last 8760 hours. HbA1C: No results for input(s): HGBA1C in the last 72 hours. CBG: No results for input(s): GLUCAP in the last 168 hours. Lipid Profile: No results for input(s): CHOL, HDL, LDLCALC, TRIG, CHOLHDL, LDLDIRECT in the last 72 hours. Thyroid Function Tests: No results for input(s): TSH, T4TOTAL, FREET4, T3FREE, THYROIDAB in the last 72 hours. Anemia Panel: No results for input(s): VITAMINB12, FOLATE, FERRITIN, TIBC, IRON, RETICCTPCT in the last 72 hours. Urine analysis:    Component Value Date/Time   COLORURINE YELLOW 05/30/2016 2003   APPEARANCEUR CLEAR 05/30/2016 2003   LABSPEC 1.012 05/30/2016 2003   PHURINE 5.0 05/30/2016 2003   GLUCOSEU NEGATIVE 05/30/2016 2003   HGBUR NEGATIVE 05/30/2016 2003   BILIRUBINUR NEGATIVE 05/30/2016 2003   KETONESUR NEGATIVE 05/30/2016 2003   PROTEINUR NEGATIVE 05/30/2016 2003   NITRITE NEGATIVE 05/30/2016 2003   LEUKOCYTESUR NEGATIVE 05/30/2016 2003   Sepsis Labs: @LABRCNTIP (procalcitonin:4,lacticidven:4) )No results found for this or any previous visit (from the past 240 hour(s)).   Radiological Exams on Admission: No results  found.   Assessment/Plan Principal Problem:   Cellulitis of left lower extremity Active Problems:   Hypertension   Uncontrolled diabetes mellitus type 2 with peripheral artery disease (HCC)   Morbid obesity (HCC)   Cellulitis    1. Left lower extremity cellulitis - patient has been placed on vancomycin and Zosyn. I have requested wound team consult. Since there is also edema I have ordered Doppler of the left lower extremity to rule out DVT. 2. Diabetes mellitus type 2 appears controlled - continue patient's insulin regimen which includes long-acting and short-acting (Apidra and [place]). Closely follow CBGs. 3. Hypertension on Procardia and atenolol. 4. History of gout on allopurinol.   DVT prophylaxis: Lovenox. Code Status: Full code.  Family Communication: Discussed with patient.  Disposition Plan: Home.  Consults called: Wound team.  Admission status: Observation.    Eduard Clos MD [facility] Pager [phone].  If 7PM-7AM, please contact night-coverage [email or web address] Password [email or web address]  07/05/2016, 2:46 AM

## 2016-07-06 ENCOUNTER — Observation Stay (HOSPITAL_COMMUNITY): Payer: Medicare PPO

## 2016-07-06 ENCOUNTER — Encounter (HOSPITAL_COMMUNITY): Payer: Medicare PPO

## 2016-07-06 DIAGNOSIS — L03116 Cellulitis of left lower limb: Secondary | ICD-10-CM | POA: Diagnosis not present

## 2016-07-06 LAB — GLUCOSE, CAPILLARY
GLUCOSE-CAPILLARY: 102 mg/dL — AB (ref 65–99)
Glucose-Capillary: 126 mg/dL — ABNORMAL HIGH (ref 65–99)

## 2016-07-06 MED ORDER — DOXYCYCLINE HYCLATE 100 MG PO TABS
100.0000 mg | ORAL_TABLET | Freq: Two times a day (BID) | ORAL | Status: DC
  Administered 2016-07-06 – 2016-07-07 (×3): 100 mg via ORAL
  Filled 2016-07-06 (×3): qty 1

## 2016-07-06 NOTE — Evaluation (Addendum)
Physical Therapy Evaluation Patient Details Name: [name] MRN: [id number] DOB: 11-28-1950 Today's Date: 07/06/2016   History of Present Illness  [name] is a right-handed 66 y.o. female with medical history significant for insulin-dependent diabetes mellitus, hypertension, gout, anxiety, chronic left lower leg wound, and TIA who presents to the ED with exacerbation of cellulits on bilateral LE's.   Clinical Impression  Pt presents with the above diagnosis and below deficits. Pt lives in a single level apartment with her husband who is her primary caregiver. Pt receives assistance from her husband for bathing and to assist with lower body dressing. Pt has been functioning at wheelchair level and performs stand pivot transfer with a Rw. Pt will benefit from continued skilled PT services upon discharge in order to maximize her functional outcomes and will benefit from continuing to be seen acutely.     Follow Up Recommendations Home health PT;Supervision for mobility/OOB    Equipment Recommendations  3in1 (PT);Other (comment) (bariatric commode)    Recommendations for Other Services OT consult (Verbal order recieved by Dr. Thedore MinsSingh )     Precautions / Restrictions Precautions Precautions: Fall Restrictions Weight Bearing Restrictions: No      Mobility  Bed Mobility               General bed mobility comments: Received at EOB working with PT on OT arrival  Transfers Overall transfer level: Needs assistance Equipment used: Rolling walker (2 wheeled) Transfers: Sit to/from [employer]Stand;Stand Pivot Transfers Sit to Stand: Min assist Stand pivot transfers: Min assist;Min guard       General transfer comment: Min A from EOB, MIn Guard to MIn A for stand pivot transfer from bed to [facility]BSC and BSC to Recliner.   Ambulation/Gait             General Gait Details: Unable to assess  Stairs            Wheelchair Mobility    Modified Rankin (Stroke Patients Only)        Balance Overall balance assessment: Needs assistance Sitting-balance support: No upper extremity supported;Feet supported Sitting balance-Leahy Scale: Good     Standing balance support: Bilateral upper extremity supported;During functional activity;No upper extremity supported Standing balance-Leahy Scale: Fair Standing balance comment: Able to stand for pericare without UE support.                             Pertinent Vitals/Pain Pain Assessment: 0-10 Pain Score: 6  Pain Location: left hand and low back Pain Descriptors / Indicators: Aching Pain Intervention(s): Limited activity within patient's tolerance;Monitored during session;Repositioned;Ice applied    Home Living Family/patient expects to be discharged to:: Private residence Living Arrangements: Spouse/significant other Available Help at Discharge: Family;Available 24 hours/day Type of Home: Apartment Home Access: Ramped entrance     Home Layout: One level Home Equipment: Wheelchair - manual;Wheelchair - power;Bedside commode;Walker - 2 wheels;Shower seat;Adaptive equipment;Other (comment) Additional Comments: Pt primarily uses the w/c to mobilize.    Prior Function Level of Independence: Needs assistance   Gait / Transfers Assistance Needed: Pt states she could only take a few steps at baseline. Mostly transfers.  ADL's / Homemaking Assistance Needed: Husband was helping occasionally with ADLs, cannot step over tub so was sponge bathing   Comments: Was receiving home health OT services prior to admission. Has reacher/sock aide and reports able to use these independently but typically has husband assist her.     Hand Dominance  Dominant Hand: Right    Extremity/Trunk Assessment   Upper Extremity Assessment Upper Extremity Assessment: LUE deficits/detail LUE Deficits / Details: Decreased active wrist flexion to approximately 10 degrees. Slow movement and unable to complete composite fist with  L hand. 3+/5 strength shoulder flexion, 4/5 strength elbow flexion/extension. Pt reports that pain and decreased movement limit her ability to perform daily tasks with L hand. LUE Coordination: decreased fine motor    Lower Extremity Assessment Lower Extremity Assessment: Generalized weakness       Communication   Communication: No difficulties  Cognition Arousal/Alertness: Awake/alert Behavior During Therapy: WFL for tasks assessed/performed Overall Cognitive Status: Within Functional Limits for tasks assessed                      General Comments      Exercises     Assessment/Plan    PT Assessment Patient needs continued PT services  PT Problem List Decreased strength;Decreased range of motion;Decreased activity tolerance;Decreased balance;Decreased mobility;Decreased safety awareness;Obesity          PT Treatment Interventions DME instruction;Gait training;Stair training;Functional mobility training;Therapeutic activities;Therapeutic exercise;Balance training;Patient/family education    PT Goals (Current goals can be found in the Care Plan section)  Acute Rehab PT Goals Patient Stated Goal: to get home PT Goal Formulation: With patient Time For Goal Achievement: 07/13/16 Potential to Achieve Goals: Fair    Frequency Min 3X/week   Barriers to discharge        Co-evaluation PT/OT/SLP Co-Evaluation/Treatment: Yes Reason for Co-Treatment: Complexity of the patient's impairments (multi-system involvement);For patient/therapist safety;To address functional/ADL transfers (Partial co-treat) PT goals addressed during session: Mobility/safety with mobility OT goals addressed during session: ADL's and self-care       End of Session Equipment Utilized During Treatment: Gait belt Activity Tolerance: Patient limited by fatigue Patient left: in chair;with call bell/phone within reach Nurse Communication: Mobility status    Functional Assessment Tool Used:  Clinical judgement, mobility assessment Functional Limitation: Mobility: Walking and moving around Mobility: Walking and Moving Around Current Status (Z6109): At least 20 percent but less than 40 percent impaired, limited or restricted Mobility: Walking and Moving Around Goal Status [phone]): At least 1 percent but less than 20 percent impaired, limited or restricted    Time: 1040-1109 PT Time Calculation (min) (ACUTE ONLY): 29 min   Charges:   PT Evaluation $PT Eval Moderate Complexity: 1 Procedure     PT G Codes:   PT G-Codes **NOT FOR INPATIENT CLASS** Functional Assessment Tool Used: Clinical judgement, mobility assessment Functional Limitation: Mobility: Walking and moving around Mobility: Walking and Moving Around Current Status (U9811): At least 20 percent but less than 40 percent impaired, limited or restricted Mobility: Walking and Moving Around Goal Status [phone]): At least 1 percent but less than 20 percent impaired, limited or restricted    Colin Broach PT, DPT  [phone]  07/06/2016, 4:46 PM

## 2016-07-06 NOTE — Progress Notes (Signed)
PROGRESS NOTE                                                                                                                                                                                                             Patient Demographics:    [name], is a 66 y.o. female, DOB - 29-Jul-1950, WUJ:[id number]  Admit date - 07/04/2016   Admitting Physician Eduard Clos, MD  Outpatient Primary MD for the patient is Aura Dials, MD  LOS - 0  Chief Complaint  Patient presents with  . Leg Pain  . Wound Infection       Brief Narrative   [name] is a 66 y.o. female with history of diabetes mellitus type 2, chronic venous stasis, hypertension, gout who was recently admitted to the hospital for weakness and was discharged to rehabilitation presents to the ER because of worsening erythema and blistering of the left lower extremity around the ankle area suspicious for cellulitis.   Subjective:    [name] today has, No headache, No chest pain, No abdominal pain - No Nausea, No new weakness tingling or numbness, No Cough - SOB.     Assessment  & Plan :     1.Left lower extremity cellulitis. Infection is mild, most of her leg changes are due to chronic venous stasis, wound care consulted, I have placed her on low-dose Lasix, UNNA boots have been applied, taper down antibiotics to oral doxycycline on 07/06/2016. Note she has no fever or leukocytosis.  2. History of gout. Currently stable continue allopurinol.  3. Hypertension. On beta blocker, nifedipine, have added diuretic as #1 above will monitor.  4. Diabetic peripheral neuropathy. On Neurontin continue.  5. DM type II. Continue present insulin regimen.  Lab Results  Component Value Date   HGBA1C 8.5 (H) 06/01/2016   CBG (last 3)   Recent Labs  07/05/16 1538 07/05/16 2046 07/06/16 0651  GLUCAP 137* 209* 126*    Family Communication  :   none  Code Status :  Full  Diet : Diet heart healthy/carb modified Room service appropriate? Yes; Fluid consistency: Thin; Fluid restriction: 1500 mL Fluid   Disposition Plan  :  DC in am, PT to Eval  Consults  :  W care  Procedures  :    Venous US Legs -   DVT  Prophylaxis  :  Lovenox   Lab Results  Component Value Date   PLT 325 07/05/2016    Inpatient Medications  Scheduled Meds: . allopurinol  300 mg Oral Daily  . aspirin  325 mg Oral Daily  . atenolol  50 mg Oral Daily   And  . atenolol  25 mg Oral QHS  . enoxaparin (LOVENOX) injection  40 mg Subcutaneous Daily  . furosemide  40 mg Oral Daily  . gabapentin  300 mg Oral BID  . insulin aspart  0-9 Units Subcutaneous TID WC  . insulin glargine  75 Units Subcutaneous Daily  . NIFEdipine  90 mg Oral Daily  . piperacillin-tazobactam (ZOSYN)  IV  3.375 g Intravenous Q8H  . potassium chloride  20 mEq Oral Daily  . vancomycin  750 mg Intravenous Q12H   Continuous Infusions:  PRN Meds:.albuterol, ALPRAZolam, bisacodyl, clonazePAM, cyclobenzaprine, [DISCONTINUED] ondansetron **OR** ondansetron (ZOFRAN) IV, polyethylene glycol  Antibiotics  :    Anti-infectives    Start     Dose/Rate Route Frequency Ordered Stop   07/05/16 1000  vancomycin (VANCOCIN) IVPB 750 mg/150 ml premix     750 mg 150 mL/hr over 60 Minutes Intravenous Every 12 hours 07/05/16 0314     07/05/16 0600  piperacillin-tazobactam (ZOSYN) IVPB 3.375 g     3.375 g 12.5 mL/hr over 240 Minutes Intravenous Every 8 hours 07/05/16 0308     07/05/16 0315  vancomycin (VANCOCIN) IVPB 1000 mg/200 mL premix     1,000 mg 200 mL/hr over 60 Minutes Intravenous  Once 07/05/16 0308 07/05/16 0603   07/05/16 0300  piperacillin-tazobactam (ZOSYN) IVPB 3.375 g  Status:  Discontinued     3.375 g 100 mL/hr over 30 Minutes Intravenous  Once 07/05/16 0245 07/05/16 0306   07/05/16 0300  vancomycin (VANCOCIN) IVPB 1000 mg/200 mL premix  Status:  Discontinued     1,000 mg 200  mL/hr over 60 Minutes Intravenous  Once 07/05/16 0245 07/05/16 0306   07/04/16 2330  vancomycin (VANCOCIN) IVPB 1000 mg/200 mL premix     1,000 mg 200 mL/hr over 60 Minutes Intravenous  Once 07/04/16 2319 07/05/16 0129   07/04/16 2330  piperacillin-tazobactam (ZOSYN) IVPB 3.375 g     3.375 g 100 mL/hr over 30 Minutes Intravenous  Once 07/04/16 2319 07/05/16 0028         Objective:   Vitals:   07/05/16 1530 07/05/16 2042 07/05/16 2233 07/06/16 0656  BP: 118/65 (!) 97/27 (!) 109/47 123/70  Pulse: 76 74  81  Resp: 18     Temp: 98.2 F (36.8 C) 98.5 F (36.9 C)  98.1 F (36.7 C)  TempSrc: Oral Oral  Oral  SpO2: 97% 95%  94%  Weight:      Height:        Wt Readings from Last 3 Encounters:  07/04/16 (!) 145.2 kg (320 lb)  06/04/16 (!) 143.3 kg (316 lb)  05/30/16 (!) 143.7 kg (316 lb 11.2 oz)     Intake/Output Summary (Last 24 hours) at 07/06/16 1120 Last data filed at 07/06/16 0848  Gross per 24 hour  Intake              240 ml  Output              600 ml  Net             -360 ml     Physical Exam  Awake Alert, Oriented X 3, No new F.N deficits, Normal affect  [place].AT,PERRAL Supple Neck,No JVD, No cervical lymphadenopathy appriciated.  Symmetrical Chest wall movement, Good air movement bilaterally, CTAB RRR,No Gallops,Rubs or new Murmurs, No Parasternal Heave +ve B.Sounds, Abd Soft, No tenderness, No organomegaly appriciated, No rebound - guarding or rigidity. No Cyanosis, Clubbing , No new Rash or bruise   Lower extremity venous stasis left more than right with chronic lichenification of the skin, mild warmth in the left leg - now under UNNA boots    Data Review:    CBC  Recent Labs Lab 07/04/16 1848 07/05/16 0328  WBC 9.1 7.6  HGB 12.3 11.1*  HCT 38.1 35.0*  PLT 359 325  MCV 89.6 89.7  MCH 28.9 28.5  MCHC 32.3 31.7  RDW 15.8* 15.8*  LYMPHSABS 1.9  --   MONOABS 0.4  --   EOSABS 0.4  --   BASOSABS 0.0  --     Chemistries   Recent Labs Lab  07/04/16 1848 07/05/16 0328  NA 140 139  K 3.9 3.7  CL 107 107  CO2 26 26  GLUCOSE 101* 130*  BUN 10 9  CREATININE 0.80 0.94  CALCIUM 9.7 9.2  AST 15  --   ALT 13*  --   ALKPHOS 100  --   BILITOT 0.4  --    ------------------------------------------------------------------------------------------------------------------ No results for input(s): CHOL, HDL, LDLCALC, TRIG, CHOLHDL, LDLDIRECT in the last 72 hours.  Lab Results  Component Value Date   HGBA1C 8.5 (H) 06/01/2016   ------------------------------------------------------------------------------------------------------------------ No results for input(s): TSH, T4TOTAL, T3FREE, THYROIDAB in the last 72 hours.  Invalid input(s): FREET3 ------------------------------------------------------------------------------------------------------------------ No results for input(s): VITAMINB12, FOLATE, FERRITIN, TIBC, IRON, RETICCTPCT in the last 72 hours.  Coagulation profile No results for input(s): INR, PROTIME in the last 168 hours.  No results for input(s): DDIMER in the last 72 hours.  Cardiac Enzymes No results for input(s): CKMB, TROPONINI, MYOGLOBIN in the last 168 hours.  Invalid input(s): CK ------------------------------------------------------------------------------------------------------------------ No results found for: BNP  Micro Results No results found for this or any previous visit (from the past 240 hour(s)).  Radiology Reports Dg Wrist 2 Views Left  Result Date: 07/06/2016 CLINICAL DATA:  Constant lateral left wrist pain with occasional swelling. No previous injury. EXAM: LEFT WRIST - 2 VIEW COMPARISON:  June 01, 2016 FINDINGS: Scapholunate widening again identified consistent with a previous scapholunate ligament injury. Dedicated scaphoid views were not obtained but there has been no interval change. The distal ulna is normal. Mild irregularity in the distal radius is probably not changed given  difference in positioning and lack of a trauma history. This may be from previous trauma or degenerative in nature. No wrist dislocation. No other acute abnormalities. IMPRESSION: 1. Scapholunate widening consistent with with ligamentous injury, unchanged since December 2017. 2. Mild irregularity of the distal radius is probably not changed given difference in positioning and likely represents sequela of previous trauma or degenerative change. Patient denies history of recent trauma. Electronically Signed   By: Gerome Sam III M.D   On: 07/06/2016 09:31    Time Spent in minutes  30   Susa Raring K M.D on 07/06/2016 at 11:20 AM  Between 7am to 7pm - Pager - [phone]  After 7pm go to [email or web address] - password [facility]  [facility] -  Office  [phone]

## 2016-07-06 NOTE — Progress Notes (Signed)
Patient complaining of Left wrist pain and anxious. Patient tried to roll out of bed but nurse and tech encourage patient to be calm.  Nurse notified on-call MD for pain med. Once patient received her pain med, patient was calm. Will continue to monitor.

## 2016-07-06 NOTE — Evaluation (Addendum)
Occupational Therapy Evaluation Patient Details Name: [name] MRN: [id number] DOB: [birth year] Today's Date: 07/06/2016    History of Present Illness [name] is a right-handed 66 y.o. female with medical history significant for insulin-dependent diabetes mellitus, hypertension, gout, anxiety, chronic left lower leg wound, and TIA who presents to the ED with exacerbation of cellulits on bilateral LE's.    Clinical Impression   PTA, pt was utilizing w/c for functional mobility and required minimal assistance for LB dressing/bathing from her husband. She was able to independently transfer from w/c for toilet transfers. Pt does have AE and is able to use without physical assistance but prefers to have assist from her husband. Pt currently requires max assist for LB ADL and min assist for stand-pivot toilet transfer to bariatric BSC with increased time. Pt would benefit from continued OT services while admitted to improve independence with ADL and functional mobility. Recommend home health OT services to maximize independence with ADL in the home environment. OT will continue to follow acutely.    Follow Up Recommendations  Home health OT;Supervision/Assistance - 24 hour    Equipment Recommendations  3 in 1 bedside commode (Bariatric)    Recommendations for Other Services       Precautions / Restrictions Precautions Precautions: Fall Restrictions Weight Bearing Restrictions: No      Mobility Bed Mobility               General bed mobility comments: Received at EOB working with PT on OT arrival  Transfers Overall transfer level: Needs assistance Equipment used: Rolling walker (2 wheeled) Transfers: Sit to/from [employer] Sit to Stand: Min assist Stand pivot transfers: Min assist;Min guard       General transfer comment: Min A from EOB, MIn Guard to MIn A for stand pivot transfer from bed to [facility] and BSC to Recliner.     Balance Overall balance  assessment: Needs assistance Sitting-balance support: No upper extremity supported;Feet supported Sitting balance-Leahy Scale: Good     Standing balance support: Bilateral upper extremity supported;During functional activity;No upper extremity supported Standing balance-Leahy Scale: Fair Standing balance comment: Able to stand for pericare without UE support.                            ADL Overall ADL's : Needs assistance/impaired Eating/Feeding: Set up;Sitting   Grooming: Set up;Sitting   Upper Body Bathing: Supervision/ safety;Set up;Sitting   Lower Body Bathing: Maximal assistance;Sit to/from stand   Upper Body Dressing : Supervision/safety;Sitting   Lower Body Dressing: Maximal assistance;Sit to/from stand   Toilet Transfer: Ambulation;BSC;RW;Stand-pivot;+2 for safety/equipment;Minimal assistance   Toileting- Clothing Manipulation and Hygiene: Sit to/from stand;Min guard       Functional mobility during ADLs: Minimal assistance;Moderate assistance;Rolling walker General ADL Comments: Pt educated on dressing techniques and safety with toilet transfers to bariatric BSC. Pt does not have bariatric BSC at home and will need this. She was previously independent with transfers from w/c to [facility] at home.     Vision Vision Assessment?: No apparent visual deficits   Perception     Praxis      Pertinent Vitals/Pain Pain Assessment: 0-10 Pain Score: 6  Pain Location: left hand and low back Pain Descriptors / Indicators: Aching Pain Intervention(s): Limited activity within patient's tolerance;Monitored during session;Repositioned;Ice applied     Hand Dominance Right   Extremity/Trunk Assessment Upper Extremity Assessment Upper Extremity Assessment: LUE deficits/detail LUE Deficits / Details: Decreased active wrist flexion  to approximately 10 degrees. Slow movement and unable to complete composite fist with L hand. 3+/5 strength shoulder flexion, 4/5 strength  elbow flexion/extension. Pt reports that pain and decreased movement limit her ability to perform daily tasks with L hand. LUE Coordination: decreased fine motor   Lower Extremity Assessment Lower Extremity Assessment: Generalized weakness       Communication Communication Communication: No difficulties   Cognition Arousal/Alertness: Awake/alert Behavior During Therapy: WFL for tasks assessed/performed Overall Cognitive Status: Within Functional Limits for tasks assessed                     General Comments   Received verbal order for OT consult from MD at 1045am for assistance with D/C planning prior to order placed in EPIC at 1309.    Exercises       Shoulder Instructions      Home Living Family/patient expects to be discharged to:: Private residence Living Arrangements: Spouse/significant other Available Help at Discharge: Family;Available 24 hours/day Type of Home: Apartment Home Access: Ramped entrance     Home Layout: One level     Bathroom Shower/Tub: Tub/shower unit Shower/tub characteristics: Curtain FirefighterBathroom Toilet: Standard Bathroom Accessibility: Yes   Home Equipment: Wheelchair - manual;Wheelchair - power;Bedside commode;Walker - 2 wheels;Shower seat;Adaptive equipment;Other (comment) Adaptive Equipment: Sock aid;Reacher Additional Comments: Pt primarily uses the w/c to mobilize.      Prior Functioning/Environment Level of Independence: Needs assistance  Gait / Transfers Assistance Needed: Pt states she could only take a few steps at baseline. Mostly transfers. ADL's / Homemaking Assistance Needed: Husband was helping occasionally with ADLs, cannot step over tub so was sponge bathing    Comments: Was receiving home health OT services prior to admission. Has reacher/sock aide and reports able to use these independently but typically has husband assist her.        OT Problem List: Decreased strength;Decreased range of motion;Decreased activity  tolerance;Impaired balance (sitting and/or standing);Decreased safety awareness;Decreased knowledge of use of DME or AE;Decreased knowledge of precautions;Pain;Impaired UE functional use   OT Treatment/Interventions: Self-care/ADL training;Therapeutic exercise;Therapeutic activities;Patient/family education;DME and/or AE instruction;Energy conservation    OT Goals(Current goals can be found in the care plan section) Acute Rehab OT Goals Patient Stated Goal: to get home OT Goal Formulation: With patient Time For Goal Achievement: 07/13/16 Potential to Achieve Goals: Good ADL Goals Pt Will Perform Lower Body Bathing: with modified independence;with adaptive equipment;sit to/from stand Pt Will Perform Lower Body Dressing: with modified independence;with adaptive equipment;sit to/from stand Pt Will Transfer to Toilet: with modified independence;bedside commode;stand pivot transfer (from w/c with RW) Pt Will Perform Toileting - Clothing Manipulation and hygiene: with modified independence;sitting/lateral leans Pt/caregiver will Perform Home Exercise Program: Left upper extremity;Increased strength;Increased ROM;With written HEP provided;With Supervision  OT Frequency: Min 1X/week   Barriers to D/C:            Co-evaluation PT/OT/SLP Co-Evaluation/Treatment: Yes (Partial) Reason for Co-Treatment: Complexity of the patient's impairments (multi-system involvement);For patient/therapist safety;To address functional/ADL transfers (Partial co-treat) PT goals addressed during session: Mobility/safety with mobility OT goals addressed during session: ADL's and self-care      End of Session Equipment Utilized During Treatment: Gait belt;Rolling walker Nurse Communication: Mobility status  Activity Tolerance: Patient tolerated treatment well Patient left: in chair;with call bell/phone within reach;with chair alarm set   Time: 1055-1109 OT Time Calculation (min): 14 min Charges:  OT General  Charges $OT Visit: 1 Procedure OT Evaluation $OT Eval Moderate Complexity: 1 Procedure G-Codes: OT  G-codes **NOT FOR INPATIENT CLASS** Functional Assessment Tool Used: clinical judgement Functional Limitation: Self care Self Care Current Status (T5573): At least 40 percent but less than 60 percent impaired, limited or restricted Self Care Goal Status (U2025): At least 1 percent but less than 20 percent impaired, limited or restricted  Doristine Section, OTR/L [phone] 07/06/2016, 3:01 PM

## 2016-07-06 NOTE — Care Management Obs Status (Signed)
MEDICARE OBSERVATION STATUS NOTIFICATION   Patient Details  Name: [name] MRN: [id number] Date of Birth: 01-Nov-1950   Medicare Observation Status Notification Given:  Yes    Yves DillJeffries, Kriste Broman Christine, RN 07/06/2016, 4:28 PM

## 2016-07-07 ENCOUNTER — Encounter (HOSPITAL_COMMUNITY): Payer: Medicare PPO

## 2016-07-07 DIAGNOSIS — L03116 Cellulitis of left lower limb: Secondary | ICD-10-CM | POA: Diagnosis not present

## 2016-07-07 LAB — GLUCOSE, CAPILLARY
GLUCOSE-CAPILLARY: 81 mg/dL (ref 65–99)
Glucose-Capillary: 117 mg/dL — ABNORMAL HIGH (ref 65–99)

## 2016-07-07 MED ORDER — FUROSEMIDE 20 MG PO TABS: 20.0000 mg | ORAL_TABLET | Freq: Every day | ORAL | 0 refills | Status: DC

## 2016-07-07 MED ORDER — POTASSIUM CHLORIDE ER 10 MEQ PO TBCR: 10.0000 meq | EXTENDED_RELEASE_TABLET | Freq: Every day | ORAL | 0 refills | Status: DC

## 2016-07-07 NOTE — Progress Notes (Signed)
Physical Therapy Treatment Patient Details Name: [name] MRN: [id number] DOB: 05/09/1951 Today's Date: 07/07/2016    History of Present Illness [name] is a right-handed 66 y.o. female with medical history significant for insulin-dependent diabetes mellitus, hypertension, gout, anxiety, chronic left lower leg wound, and TIA who presents to the ED with exacerbation of cellulits on bilateral LE's.     PT Comments    Pt moving better today than on eval, ambulated 16' with RW and min-guard A. Will have needed supervision at home with husband and f/u with HHPT. PT signing off for d/c.  Follow Up Recommendations  Home health PT;Supervision for mobility/OOB     Equipment Recommendations  3in1 (PT);Other (comment)    Recommendations for Other Services       Precautions / Restrictions Precautions Precautions: Fall Restrictions Weight Bearing Restrictions: No    Mobility  Bed Mobility               General bed mobility comments: pt sitting in chair  Transfers Overall transfer level: Needs assistance Equipment used: Rolling walker (2 wheeled) Transfers: Sit to/from Stand Sit to Stand: Supervision         General transfer comment: pt stood from recliner and BSC with supervision, no LOB, fwd flexed posture  Ambulation/Gait Ambulation/Gait assistance: Min guard Ambulation Distance (Feet): 16 Feet (2', 2', 12') Assistive device: Rolling walker (2 wheeled) Gait Pattern/deviations: Step-through pattern;Wide base of support;Trunk flexed Gait velocity: decreased Gait velocity interpretation: <1.8 ft/sec, indicative of risk for recurrent falls General Gait Details: pt ambulated to [facility] and then back to chair and then walked to doorway. vc's for upright posture. Pt with wide BOS.    Stairs            Wheelchair Mobility    Modified Rankin (Stroke Patients Only)       Balance Overall balance assessment: Needs assistance Sitting-balance support: No  upper extremity supported;Feet supported Sitting balance-Leahy Scale: Good     Standing balance support: Bilateral upper extremity supported;During functional activity;No upper extremity supported Standing balance-Leahy Scale: Fair Standing balance comment: pt able to clean self in squatting posture without UE support but for gait, requires UE support for safety                    Cognition Arousal/Alertness: Awake/alert Behavior During Therapy: WFL for tasks assessed/performed Overall Cognitive Status: Within Functional Limits for tasks assessed                      Exercises General Exercises - Lower Extremity Ankle Circles/Pumps: AROM;Both;10 reps;Seated Long Arc Quad: AROM;Both;10 reps;Seated Hip ABduction/ADduction: AROM;Both;5 reps;Seated Hip Flexion/Marching: AROM;Both;10 reps;Seated    General Comments        Pertinent Vitals/Pain Pain Assessment: No/denies pain    Home Living                      Prior Function            PT Goals (current goals can now be found in the care plan section) Acute Rehab PT Goals Patient Stated Goal: to get home PT Goal Formulation: With patient Time For Goal Achievement: 07/13/16 Potential to Achieve Goals: Fair Progress towards PT goals: Goals met/education completed, patient discharged from PT    Frequency    Min 3X/week      PT Plan Current plan remains appropriate    Co-evaluation  End of Session   Activity Tolerance: Patient tolerated treatment well Patient left: in chair;with call bell/phone within reach;with family/visitor present     Time: 5110-2111 PT Time Calculation (min) (ACUTE ONLY): 14 min  Charges:  $Gait Training: 8-22 mins                    G Codes:  Functional Assessment Tool Used: Clinical judgement, mobility assessment Functional Limitation: Mobility: Walking and moving around Mobility: Walking and Moving Around Current Status [phone]): At least 1  percent but less than 20 percent impaired, limited or restricted Mobility: Walking and Moving Around Discharge Status [phone]): At least 1 percent but less than 20 percent impaired, limited or restricted  [place], [place]  [place] 07/07/2016, 2:00 PM

## 2016-07-07 NOTE — Care Management Note (Signed)
Case Management Note  Patient Details  Name: [name] MRN: [id number] Date of Birth: [birth year]  Subjective/Objective:   66 yr old female admitted with cellulitis of left lower leg.               Action/Plan: Case manager spoke with patient concerning Home Health and DME needs. Patient [facility]. CM called Katharina Caperrew Wilke, [facility] to confirm and to inform him of resumption of care. CM ordered 3in1, wide. Patient will have family support at discharge.   Expected Discharge Date:  07/07/16               Expected Discharge Plan:  Home w Home Health Services  In-House Referral:  NA  Discharge planning Services  CM Consult  Post Acute Care Choice:  Resumption of Svcs/PTA Provider Choice offered to:  Patient  DME Arranged:  3-N-1 DME Agency:     HH Arranged:  PT, OT, Nurse's Aide [facility]  Status of Service:  Completed, signed off  If discussed at Long Length of Stay Meetings, dates discussed:    Additional Comments:  Durenda GuthrieBrady, Taksh Hjort Naomi, RN 07/07/2016, 12:28 PM

## 2016-07-07 NOTE — Discharge Instructions (Signed)
Follow with Primary MD BOUSKA,DAVID E, MD in 2-3 days   Get CBC, CMP, 2 view Chest X ray checked  by Primary MD  in 2-3 days ( we routinely change or add medications that can affect your baseline labs and fluid status, therefore we recommend that you get the mentioned basic workup next visit with your PCP, your PCP may decide not to get them or add new tests based on their clinical decision)   Activity: As tolerated with Full fall precautions use walker/cane & assistance as needed   Disposition Home     Diet:   Diet heart healthy/carb modified, Fluid restriction: 1500 mL Fluid/day     Check your Weight same time everyday, if you gain over 2 pounds, or you develop in leg swelling, experience more shortness of breath or chest pain, call your Primary MD immediately. Follow Cardiac Low Salt Diet and 1.5 lit/day fluid restriction.   On your next visit with your primary care physician please Get Medicines reviewed and adjusted.   Please request your Prim.MD to go over all Hospital Tests and Procedure/Radiological results at the follow up, please get all Hospital records sent to your Prim MD by signing hospital release before you go home.   If you experience worsening of your admission symptoms, develop shortness of breath, life threatening emergency, suicidal or homicidal thoughts you must seek medical attention immediately by calling 911 or calling your MD immediately  if symptoms less severe.  You Must read complete instructions/literature along with all the possible adverse reactions/side effects for all the Medicines you take and that have been prescribed to you. Take any new Medicines after you have completely understood and accpet all the possible adverse reactions/side effects.   Do not drive, operate heavy machinery, perform activities at heights, swimming or participation in water activities or provide baby sitting services if your were admitted for syncope or siezures until you have  seen by Primary MD or a Neurologist and advised to do so again.  Do not drive when taking Pain medications.    Do not take more than prescribed Pain, Sleep and Anxiety Medications  Special Instructions: If you have smoked or chewed Tobacco  in the last 2 yrs please stop smoking, stop any regular Alcohol  and or any Recreational drug use.  Wear Seat belts while driving.   Please note  You were cared for by a hospitalist during your hospital stay. If you have any questions about your discharge medications or the care you received while you were in the hospital after you are discharged, you can call the unit and asked to speak with the hospitalist on call if the hospitalist that took care of you is not available. Once you are discharged, your primary care physician will handle any further medical issues. Please note that NO REFILLS for any discharge medications will be authorized once you are discharged, as it is imperative that you return to your primary care physician (or establish a relationship with a primary care physician if you do not have one) for your aftercare needs so that they can reassess your need for medications and monitor your lab values.

## 2016-07-07 NOTE — Consult Note (Addendum)
WOC Nurse wound consult note WOC consult was requested for left leg wound.  Una boots were applied on 1/20 by the ortho tech, according to the EMR, and pt is preparing to discharge this afternoon.  Called primary team to discuss plan of care and determine if Una boots should be removed to assess left leg.  Dr Thedore MinsSingh stated there was no need to perform the consult prior to discharge, and home health has been arranged. Please re-consult if further assistance is needed.  Thank-you,  Cammie Mcgeeawn Sundance Moise MSN, RN, CWOCN, [place], CNS [phone]

## 2016-07-07 NOTE — Progress Notes (Signed)
Reviewed discharge instructions/medications with patient and patient's husband.  Answered their questions.  Patient is ready for discharge.

## 2016-07-07 NOTE — Discharge Summary (Signed)
[name] EAV:[id number] DOB: 1950/08/26 DOA: 07/04/2016  PCP: Aura Dials, MD  Admit date: 07/04/2016  Discharge date: 07/07/2016  Admitted From: Home   Disposition:  Home   Recommendations for Outpatient Follow-up:   Follow up with PCP in 1-2 weeks  PCP Please obtain BMP/CBC, 2 view CXR in 1week,  (see Discharge instructions)   PCP Please follow up on the following pending results: Monitor BMP and diuretic dose closely   Home Health: PT, RN, home health aide   Equipment/Devices: None Consultations: Wound care Discharge Condition: Stable  CODE STATUS: Full  Diet Recommendation: Diet heart healthy/carb modified,Fluid restriction: 1500 mL Fluid    Chief Complaint  Patient presents with  . Leg Pain  . Wound Infection     Brief history of present illness from the day of admission and additional interim summary    [name] Dingleis a 66 y.o.femalewith history of diabetes mellitus type 2, chronic venous stasis, hypertension, gout who was recently admitted to the hospital for weakness and was discharged to rehabilitation presents to the ER because of worsening erythema and blistering of the left lower extremity around the ankle area suspicious for cellulitis.  Hospital issues addressed     1.Left lower extremity cellulitis. Infection was mild, most of her leg changes were due to chronic venous stasis, wound care was consulted,  She was placed on low-dose Lasix along with fluid restriction, UNNA boots  were applied, she has shown remarkable improvement in swelling and edema, no discharge from the left leg. Note she has no fever or leukocytosis. At this time no further antibiotics she will be placed on low-dose Lasix and potassium supplement, Unna boots will be continued at home. Home RN and PT and home health  aide will be ordered. Request PCP to monitor her clinically closely also monitor her BMP and diuretic dose in the outpatient setting.  2. History of gout. Currently stable continue allopurinol.  3. Hypertension. On beta blocker, nifedipine, have added diuretic as #1 above will monitor.  4. Diabetic peripheral neuropathy. On Neurontin continue.  5. DM type II. Continue present home regimen.   Discharge diagnosis     Principal Problem:   Cellulitis of left lower extremity Active Problems:   Hypertension   Uncontrolled diabetes mellitus type 2 with peripheral artery disease (HCC)   Morbid obesity (HCC)   Cellulitis    Discharge instructions    Discharge Instructions    Discharge instructions    Complete by:  As directed    Follow with Primary MD BOUSKA,DAVID E, MD in 2-3 days   Get CBC, CMP, 2 view Chest X ray checked  by Primary MD  in 2-3 days ( we routinely change or add medications that can affect your baseline labs and fluid status, therefore we recommend that you get the mentioned basic workup next visit with your PCP, your PCP may decide not to get them or add new tests based on their clinical decision)   Activity: As tolerated with Full fall precautions use walker/cane & assistance  as needed   Disposition Home     Diet:   Diet heart healthy/carb modified, Fluid restriction: 1500 mL Fluid/day     Check your Weight same time everyday, if you gain over 2 pounds, or you develop in leg swelling, experience more shortness of breath or chest pain, call your Primary MD immediately. Follow Cardiac Low Salt Diet and 1.5 lit/day fluid restriction.   On your next visit with your primary care physician please Get Medicines reviewed and adjusted.   Please request your Prim.MD to go over all Hospital Tests and Procedure/Radiological results at the follow up, please get all Hospital records sent to your Prim MD by signing hospital release before you go home.   If you  experience worsening of your admission symptoms, develop shortness of breath, life threatening emergency, suicidal or homicidal thoughts you must seek medical attention immediately by calling 911 or calling your MD immediately  if symptoms less severe.  You Must read complete instructions/literature along with all the possible adverse reactions/side effects for all the Medicines you take and that have been prescribed to you. Take any new Medicines after you have completely understood and accpet all the possible adverse reactions/side effects.   Do not drive, operate heavy machinery, perform activities at heights, swimming or participation in water activities or provide baby sitting services if your were admitted for syncope or siezures until you have seen by Primary MD or a Neurologist and advised to do so again.  Do not drive when taking Pain medications.    Do not take more than prescribed Pain, Sleep and Anxiety Medications  Special Instructions: If you have smoked or chewed Tobacco  in the last 2 yrs please stop smoking, stop any regular Alcohol  and or any Recreational drug use.  Wear Seat belts while driving.   Please note  You were cared for by a hospitalist during your hospital stay. If you have any questions about your discharge medications or the care you received while you were in the hospital after you are discharged, you can call the unit and asked to speak with the hospitalist on call if the hospitalist that took care of you is not available. Once you are discharged, your primary care physician will handle any further medical issues. Please note that NO REFILLS for any discharge medications will be authorized once you are discharged, as it is imperative that you return to your primary care physician (or establish a relationship with a primary care physician if you do not have one) for your aftercare needs so that they can reassess your need for medications and monitor your lab values.    Increase activity slowly    Complete by:  As directed       Discharge Medications   Allergies as of 07/07/2016      Reactions   Ace Inhibitors Swelling   Metformin And Related Other (See Comments)   chills      Medication List    TAKE these medications   albuterol 108 (90 Base) MCG/ACT inhaler Commonly known as:  PROVENTIL HFA;VENTOLIN HFA Inhale 2 puffs into the lungs every 6 (six) hours as needed for wheezing or shortness of breath.   allopurinol 300 MG tablet Commonly known as:  ZYLOPRIM Take 300 mg by mouth daily.   APIDRA SOLOSTAR 100 UNIT/ML Solostar Pen Generic drug:  Insulin Glulisine Inject 15 Units into the skin 2 (two) times daily.   aspirin 325 MG tablet Take 325 mg by mouth daily.  atenolol 25 MG tablet Commonly known as:  TENORMIN Take 25-50 mg by mouth 2 (two) times daily. Take 50 mg every morning and then 25 mg every evening   bisacodyl 5 MG EC tablet Commonly known as:  DULCOLAX Take 5 mg by mouth daily as needed for moderate constipation.   clonazePAM 0.5 MG tablet Commonly known as:  KLONOPIN Take 1 tablet (0.5 mg total) by mouth daily as needed for anxiety.   cyclobenzaprine 10 MG tablet Commonly known as:  FLEXERIL Take 10 mg by mouth 3 (three) times daily as needed for muscle spasms.   diclofenac sodium 1 % Gel Commonly known as:  VOLTAREN Apply 4 g topically 4 (four) times daily. What changed:  when to take this  reasons to take this   furosemide 20 MG tablet Commonly known as:  LASIX Take 1 tablet (20 mg total) by mouth daily.   gabapentin 300 MG capsule Commonly known as:  NEURONTIN Take 1 capsule (300 mg total) by mouth 2 (two) times daily.   hydrocerin Crea Apply 1 application topically daily.   insulin degludec 100 UNIT/ML Sopn FlexTouch Pen Commonly known as:  TRESIBA FLEXTOUCH Inject 0.75 mLs (75 Units total) into the skin daily. What changed:  when to take this   NIFEdipine 90 MG 24 hr tablet Commonly known  as:  PROCARDIA XL/ADALAT-CC Take 90 mg by mouth daily.   polyethylene glycol packet Commonly known as:  MIRALAX / GLYCOLAX Take 17 g by mouth daily as needed for moderate constipation.   potassium chloride 10 MEQ tablet Commonly known as:  K-DUR Take 1 tablet (10 mEq total) by mouth daily.       Follow-up Information    BOUSKA,DAVID E, MD. Schedule an appointment as soon as possible for a visit in 2 day(s).   Specialty:  Family Medicine Contact information: [address] [facility] [place] [address] [phone]           Major procedures and Radiology Reports - PLEASE review detailed and final reports thoroughly  -         Dg Wrist 2 Views Left  Result Date: 07/06/2016 CLINICAL DATA:  Constant lateral left wrist pain with occasional swelling. No previous injury. EXAM: LEFT WRIST - 2 VIEW COMPARISON:  June 01, 2016 FINDINGS: Scapholunate widening again identified consistent with a previous scapholunate ligament injury. Dedicated scaphoid views were not obtained but there has been no interval change. The distal ulna is normal. Mild irregularity in the distal radius is probably not changed given difference in positioning and lack of a trauma history. This may be from previous trauma or degenerative in nature. No wrist dislocation. No other acute abnormalities. IMPRESSION: 1. Scapholunate widening consistent with with ligamentous injury, unchanged since December 2017. 2. Mild irregularity of the distal radius is probably not changed given difference in positioning and likely represents sequela of previous trauma or degenerative change. Patient denies history of recent trauma. Electronically Signed   By: Gerome Sam III M.D   On: 07/06/2016 09:31    Micro Results     No results found for this or any previous visit (from the past 240 hour(s)).  Today   Subjective    [name] today has no headache,no chest abdominal pain,no new  weakness tingling or numbness, feels much better wants to go home today.    Objective   Blood pressure 128/80, pulse (!) 106, temperature 98.9 F (37.2 C), temperature source Oral, resp. rate  16, height 5\' 2"  (1.575 m), weight (!) 145.2 kg (320 lb), SpO2 97 %.   Intake/Output Summary (Last 24 hours) at 07/07/16 1041 Last data filed at 07/07/16 0900  Gross per 24 hour  Intake              720 ml  Output             2200 ml  Net            -1480 ml    Exam Awake Alert, Oriented x 3, No new F.N deficits, Normal affect [place].AT,PERRAL Supple Neck,No JVD, No cervical lymphadenopathy appriciated.  Symmetrical Chest wall movement, Good air movement bilaterally, CTAB RRR,No Gallops,Rubs or new Murmurs, No Parasternal Heave +ve B.Sounds, Abd Soft, Non tender, No organomegaly appriciated, No rebound -guarding or rigidity. No Cyanosis, Clubbing or edema, No new Rash or bruise. Lower extremity venous stasis left more than right with chronic lichenification of the skin, mild warmth in the left leg - now under UNNA boots   Data Review   CBC w Diff: Lab Results  Component Value Date   WBC 7.6 07/05/2016   HGB 11.1 (L) 07/05/2016   HCT 35.0 (L) 07/05/2016   PLT 325 07/05/2016   LYMPHOPCT 21 07/04/2016   MONOPCT 5 07/04/2016   EOSPCT 5 07/04/2016   BASOPCT 0 07/04/2016    CMP: Lab Results  Component Value Date   NA 139 07/05/2016   K 3.7 07/05/2016   CL 107 07/05/2016   CO2 26 07/05/2016   BUN 9 07/05/2016   CREATININE 0.94 07/05/2016   CREATININE 0.88 01/25/2016   PROT 8.2 (H) 07/04/2016   ALBUMIN 3.0 (L) 07/04/2016   BILITOT 0.4 07/04/2016   ALKPHOS 100 07/04/2016   AST 15 07/04/2016   ALT 13 (L) 07/04/2016  .   Total Time in preparing paper work, data evaluation and todays exam - 35 minutes  Leroy Sea M.D on 07/07/2016 at 10:41 AM  [facility]   Office  [phone]

## 2016-07-16 DIAGNOSIS — R928 Other abnormal and inconclusive findings on diagnostic imaging of breast: Secondary | ICD-10-CM | POA: Insufficient documentation

## 2016-07-21 ENCOUNTER — Other Ambulatory Visit: Payer: Self-pay | Admitting: Family Medicine

## 2016-07-21 DIAGNOSIS — R928 Other abnormal and inconclusive findings on diagnostic imaging of breast: Secondary | ICD-10-CM

## 2016-07-23 DIAGNOSIS — I872 Venous insufficiency (chronic) (peripheral): Secondary | ICD-10-CM | POA: Insufficient documentation

## 2016-08-07 ENCOUNTER — Other Ambulatory Visit: Payer: Self-pay | Admitting: *Deleted

## 2016-08-08 ENCOUNTER — Other Ambulatory Visit: Payer: Self-pay | Admitting: *Deleted

## 2016-08-11 ENCOUNTER — Encounter (HOSPITAL_COMMUNITY): Payer: Self-pay | Admitting: *Deleted

## 2016-08-11 NOTE — Progress Notes (Signed)
Pt denies SOB, chest pain, and being under the care of a cardiologist. Pt stated that a stress test was performed > 5 years ago but denies having a cardiac cath and echo. Pt stated that MD advised that she take half dose of insulin at dinner ( 7 units Apidra) and no insulin the morning of procedure. Pt stated that her fasting blood glucose ranges between 100-160. Pt made aware to check her BG every 2 hours prior to arrival to hospital, interventions for BG < 70 and # to SS if BG remains< 70 after drinking Cranberry Juice. Pt made aware to stop otc vitamins, fish oil, herbal medications and NSAID's such as Voltaren. Pt verbalized understanding of all pre-op instructions.

## 2016-08-12 ENCOUNTER — Encounter (HOSPITAL_COMMUNITY)
Admission: RE | Disposition: A | Discharge status: Home / Self Care | Payer: Self-pay | Source: Ambulatory Visit | Attending: Vascular Surgery

## 2016-08-12 ENCOUNTER — Ambulatory Visit (HOSPITAL_COMMUNITY)
Admission: RE | Admit: 2016-08-12 | Discharge: 2016-08-12 | Disposition: A | Discharge status: Home / Self Care | Payer: Medicare PPO | Source: Ambulatory Visit | Attending: Vascular Surgery | Admitting: Vascular Surgery

## 2016-08-12 ENCOUNTER — Ambulatory Visit (HOSPITAL_COMMUNITY): Payer: Medicare PPO | Admitting: Certified Registered Nurse Anesthetist

## 2016-08-12 ENCOUNTER — Encounter (HOSPITAL_COMMUNITY): Payer: Self-pay | Admitting: *Deleted

## 2016-08-12 DIAGNOSIS — Z8673 Personal history of transient ischemic attack (TIA), and cerebral infarction without residual deficits: Secondary | ICD-10-CM | POA: Diagnosis not present

## 2016-08-12 DIAGNOSIS — F419 Anxiety disorder, unspecified: Secondary | ICD-10-CM | POA: Diagnosis not present

## 2016-08-12 DIAGNOSIS — Z79899 Other long term (current) drug therapy: Secondary | ICD-10-CM | POA: Diagnosis not present

## 2016-08-12 DIAGNOSIS — Z7982 Long term (current) use of aspirin: Secondary | ICD-10-CM | POA: Diagnosis not present

## 2016-08-12 DIAGNOSIS — M109 Gout, unspecified: Secondary | ICD-10-CM | POA: Diagnosis not present

## 2016-08-12 DIAGNOSIS — Z794 Long term (current) use of insulin: Secondary | ICD-10-CM | POA: Insufficient documentation

## 2016-08-12 DIAGNOSIS — E1151 Type 2 diabetes mellitus with diabetic peripheral angiopathy without gangrene: Secondary | ICD-10-CM | POA: Insufficient documentation

## 2016-08-12 DIAGNOSIS — Z6841 Body Mass Index (BMI) 40.0 and over, adult: Secondary | ICD-10-CM | POA: Insufficient documentation

## 2016-08-12 DIAGNOSIS — R51 Headache: Secondary | ICD-10-CM | POA: Diagnosis not present

## 2016-08-12 DIAGNOSIS — I10 Essential (primary) hypertension: Secondary | ICD-10-CM | POA: Insufficient documentation

## 2016-08-12 HISTORY — PX: ARTERY BIOPSY: SHX891

## 2016-08-12 LAB — POCT I-STAT 4, (NA,K, GLUC, HGB,HCT)
Glucose, Bld: 147 mg/dL — ABNORMAL HIGH (ref 65–99)
HCT: 39 % (ref 36.0–46.0)
HEMOGLOBIN: 13.3 g/dL (ref 12.0–15.0)
POTASSIUM: 3.9 mmol/L (ref 3.5–5.1)
SODIUM: 138 mmol/L (ref 135–145)

## 2016-08-12 LAB — GLUCOSE, CAPILLARY: Glucose-Capillary: 136 mg/dL — ABNORMAL HIGH (ref 65–99)

## 2016-08-12 SURGERY — BIOPSY TEMPORAL ARTERY
Anesthesia: Monitor Anesthesia Care | Site: Head | Laterality: Right

## 2016-08-12 MED ORDER — ONDANSETRON HCL 4 MG/2ML IJ SOLN
INTRAMUSCULAR | Status: DC | PRN
  Administered 2016-08-12: 4 mg via INTRAVENOUS

## 2016-08-12 MED ORDER — CHLORHEXIDINE GLUCONATE CLOTH 2 % EX PADS: 6.0000 | MEDICATED_PAD | Freq: Once | CUTANEOUS | Status: DC

## 2016-08-12 MED ORDER — DEXTROSE 5 % IV SOLN
1.5000 g | INTRAVENOUS | Status: AC
  Administered 2016-08-12: 1.5 g via INTRAVENOUS
  Filled 2016-08-12: qty 1.5

## 2016-08-12 MED ORDER — ONDANSETRON HCL 4 MG/2ML IJ SOLN: 4.0000 mg | Freq: Four times a day (QID) | INTRAMUSCULAR | Status: DC | PRN

## 2016-08-12 MED ORDER — FENTANYL CITRATE (PF) 100 MCG/2ML IJ SOLN
INTRAMUSCULAR | Status: AC
  Filled 2016-08-12: qty 2

## 2016-08-12 MED ORDER — MIDAZOLAM HCL 2 MG/2ML IJ SOLN
INTRAMUSCULAR | Status: AC
  Filled 2016-08-12: qty 2

## 2016-08-12 MED ORDER — 0.9 % SODIUM CHLORIDE (POUR BTL) OPTIME
TOPICAL | Status: DC | PRN
  Administered 2016-08-12: 1000 mL

## 2016-08-12 MED ORDER — FENTANYL CITRATE (PF) 100 MCG/2ML IJ SOLN: 25.0000 ug | INTRAMUSCULAR | Status: DC | PRN

## 2016-08-12 MED ORDER — SODIUM CHLORIDE 0.9 % IV SOLN
INTRAVENOUS | Status: DC
  Administered 2016-08-12: 08:00:00 via INTRAVENOUS

## 2016-08-12 MED ORDER — OXYCODONE HCL 5 MG/5ML PO SOLN: 5.0000 mg | Freq: Once | ORAL | Status: DC | PRN

## 2016-08-12 MED ORDER — OXYCODONE HCL 5 MG PO TABS: 5.0000 mg | ORAL_TABLET | Freq: Once | ORAL | Status: DC | PRN

## 2016-08-12 MED ORDER — FENTANYL CITRATE (PF) 100 MCG/2ML IJ SOLN
INTRAMUSCULAR | Status: DC | PRN
  Administered 2016-08-12 (×4): 25 ug via INTRAVENOUS

## 2016-08-12 MED ORDER — LIDOCAINE HCL (PF) 1 % IJ SOLN
INTRAMUSCULAR | Status: AC
  Filled 2016-08-12: qty 30

## 2016-08-12 MED ORDER — MIDAZOLAM HCL 2 MG/2ML IJ SOLN
INTRAMUSCULAR | Status: DC | PRN
  Administered 2016-08-12 (×3): 1 mg via INTRAVENOUS

## 2016-08-12 MED ORDER — LIDOCAINE HCL (PF) 1 % IJ SOLN
INTRAMUSCULAR | Status: DC | PRN
  Administered 2016-08-12: 3 mL via INTRADERMAL

## 2016-08-12 SURGICAL SUPPLY — 33 items
CANISTER SUCT 3000ML PPV (MISCELLANEOUS) ×2 IMPLANT
CLIP TI WIDE RED SMALL 6 (CLIP) ×2 IMPLANT
CONT SPEC 4OZ CLIKSEAL STRL BL (MISCELLANEOUS) ×2 IMPLANT
COTTONBALL LRG STERILE PKG (GAUZE/BANDAGES/DRESSINGS) ×2 IMPLANT
COVER SURGICAL LIGHT HANDLE (MISCELLANEOUS) IMPLANT
DECANTER SPIKE VIAL GLASS SM (MISCELLANEOUS) IMPLANT
DERMABOND ADVANCED (GAUZE/BANDAGES/DRESSINGS) ×1
DERMABOND ADVANCED .7 DNX12 (GAUZE/BANDAGES/DRESSINGS) ×1 IMPLANT
DRAPE ORTHO SPLIT 77X108 STRL (DRAPES) ×1
DRAPE PROXIMA HALF (DRAPES) ×2 IMPLANT
DRAPE SURG ORHT 6 SPLT 77X108 (DRAPES) ×1 IMPLANT
ELECT REM PT RETURN 9FT ADLT (ELECTROSURGICAL) ×2
ELECTRODE REM PT RTRN 9FT ADLT (ELECTROSURGICAL) ×1 IMPLANT
GAUZE SPONGE 4X4 16PLY XRAY LF (GAUZE/BANDAGES/DRESSINGS) ×2 IMPLANT
GLOVE BIO SURGEON STRL SZ7.5 (GLOVE) ×2 IMPLANT
GOWN STRL REUS W/ TWL LRG LVL3 (GOWN DISPOSABLE) ×3 IMPLANT
GOWN STRL REUS W/TWL LRG LVL3 (GOWN DISPOSABLE) ×3
KIT BASIN OR (CUSTOM PROCEDURE TRAY) ×2 IMPLANT
KIT ROOM TURNOVER OR (KITS) ×2 IMPLANT
MARKER SKIN DUAL TIP RULER LAB (MISCELLANEOUS) ×2 IMPLANT
NEEDLE HYPO 25GX1X1/2 BEV (NEEDLE) ×2 IMPLANT
NS IRRIG 1000ML POUR BTL (IV SOLUTION) ×2 IMPLANT
PACK GENERAL/GYN (CUSTOM PROCEDURE TRAY) ×2 IMPLANT
PAD ARMBOARD 7.5X6 YLW CONV (MISCELLANEOUS) ×4 IMPLANT
SUCTION FRAZIER HANDLE 10FR (MISCELLANEOUS) ×1
SUCTION TUBE FRAZIER 10FR DISP (MISCELLANEOUS) ×1 IMPLANT
SUT PROLENE 6 0 CC (SUTURE) IMPLANT
SUT SILK 3 0 (SUTURE) ×1
SUT SILK 3-0 18XBRD TIE 12 (SUTURE) ×1 IMPLANT
SUT VIC AB 3-0 SH 27 (SUTURE) ×1
SUT VIC AB 3-0 SH 27XBRD (SUTURE) ×1 IMPLANT
SYR CONTROL 10ML LL (SYRINGE) ×2 IMPLANT
WATER STERILE IRR 1000ML POUR (IV SOLUTION) ×2 IMPLANT

## 2016-08-12 NOTE — Op Note (Signed)
Procedure: Righ temporal artery biopsy  Preoperative diagnosis: Headaches  Postoperative diagnosis: Same  Anesthesia: Local with IV sedation  Specimens: Right temporal artery  Operative details: After obtaining informed consent, the patient was taken to the operating room. The patient was placed in supine position on the operating room table. Next Doppler was used to map out the course of the right superficial temporal artery. This area was prepped and draped in usual sterile fashion. Local anesthesia was infiltrated over this area. Skin incision was made over the area of the left superficial temporal artery. The temporal artery was dissected free circumferentially. This was ligated proximally and distally with 3-0 silk ties. The intervening segment of approximately 3 cm was excised completely and sent to pathology as a specimen. Hemostasis was obtained.  The skin was closed with 3 0 Vicryl subcuticular stitch. Dermabond was applied the skin incision. The patient tolerated procedure well and there were no complications. Instrument sponge and needle counts were correct at the end of the case. The patient was taken to the recovery room in stable condition.  Fabienne Brunsharles Karon Cotterill, MD [facility] [place] [place] Office: [phone] Pager: [phone]

## 2016-08-12 NOTE — Anesthesia Postprocedure Evaluation (Addendum)
Anesthesia Post Note  Patient: [name]  Procedure(s) Performed: Procedure(s) (LRB): BIOPSY TEMPORAL ARTERY (Right)  Patient location during evaluation: PACU Anesthesia Type: MAC Level of consciousness: awake and alert Pain management: pain level controlled Vital Signs Assessment: post-procedure vital signs reviewed and stable Respiratory status: spontaneous breathing, nonlabored ventilation, respiratory function stable and patient connected to nasal cannula oxygen Cardiovascular status: stable and blood pressure returned to baseline Anesthetic complications: no       Last Vitals:  Vitals:   08/12/16 1100 08/12/16 1101  BP: 139/87 139/87  Pulse: 79 78  Resp: 18 18  Temp: 36.6 C     Last Pain:  Vitals:   08/12/16 0832  TempSrc: Oral                 Marketta Valadez S

## 2016-08-12 NOTE — Anesthesia Preprocedure Evaluation (Signed)
Anesthesia Evaluation  Patient identified by MRN, date of birth, ID band Patient awake    Reviewed: Allergy & Precautions, H&P , NPO status , Patient's Chart, lab work & pertinent test results  Airway Mallampati: III   Neck ROM: full    Dental   Pulmonary asthma ,    breath sounds clear to auscultation       Cardiovascular hypertension, + Peripheral Vascular Disease   Rhythm:regular Rate:Normal     Neuro/Psych  Headaches, Anxiety Depression TIA   GI/Hepatic hiatal hernia,   Endo/Other  diabetes, Type 2Morbid obesity  Renal/GU      Musculoskeletal  (+) Arthritis ,   Abdominal   Peds  Hematology   Anesthesia Other Findings   Reproductive/Obstetrics                             Anesthesia Physical Anesthesia Plan  ASA: III  Anesthesia Plan: MAC   Post-op Pain Management:    Induction: Intravenous  Airway Management Planned: Simple Face Mask  Additional Equipment:   Intra-op Plan:   Post-operative Plan:   Informed Consent: I have reviewed the patients History and Physical, chart, labs and discussed the procedure including the risks, benefits and alternatives for the proposed anesthesia with the patient or authorized representative who has indicated his/her understanding and acceptance.     Plan Discussed with: CRNA, Anesthesiologist and Surgeon  Anesthesia Plan Comments:         Anesthesia Quick Evaluation

## 2016-08-12 NOTE — Transfer of Care (Signed)
Immediate Anesthesia Transfer of Care Note  Patient: [name]  Procedure(s) Performed: Procedure(s) with comments: BIOPSY TEMPORAL ARTERY (Right) - BIOPSY TEMPORAL ARTERY  Patient Location: PACU  Anesthesia Type:MAC  Level of Consciousness: awake, alert , oriented and patient cooperative  Airway & Oxygen Therapy: Patient Spontanous Breathing and Patient connected to nasal cannula oxygen  Post-op Assessment: Report given to RN, Post -op Vital signs reviewed and stable and Patient moving all extremities X 4  Post vital signs: Reviewed and stable  Last Vitals:  Vitals:   08/12/16 0832  BP: (!) 111/55  Pulse: 77  Resp: 18  Temp: 36.4 C    Last Pain:  Vitals:   08/12/16 0832  TempSrc: Oral      Patients Stated Pain Goal: 3 (03/47/42 5956)  Complications: No apparent anesthesia complications

## 2016-08-12 NOTE — H&P (Signed)
Referring Physician: Dr Everlene Other   Patient name: Ahsha Hinsley  MRN: [id number]        DOB: Aug 18, 1950        Sex: female   REASON FOR CONSULT: temporal artery biopsy   HPI: Dondi Burandt is a 66 y.o. female, with several week history of vague right sided headaches and right maxilla pain. She denies any real visual changes although she states she had some changes in her eyes a few weeks ago where she felt like she was looking through wet glass. This resolved in less than 24 hours. She does have a history of migraine headaches but has not had one for years. She had a sedimentation rate checked which was 63. She was referred for possible temporal artery biopsy. She currently resides in skilled nursing facility because she is unable to walk. Other medical problems include arthritis, asthma, gout hypertension hyperlipidemia and diabetes all of which are stable.       Past Medical History:  Diagnosis Date  . Anxiety    . Arthritis      "back, arms, legs" (03/24/2016)  . Asthma    . Chronic lower back pain    . Colonic polyp      last colonoscopy done in 2009 with normal results per medical record  . Depressive disorder    . Gastric polyp    . Gout      has taken allopurinol 300mg  once daily in past  . Headache    . History of hiatal hernia    . Hypertension    . Migraine      "none in awhile; might have a couple/year" (03/24/2016)  . Mixed hyperlipidemia      01/2016 Total chol 141, HDL 59, LDL 63, ration 1.1  . Osteoarthritis    . TIA (transient ischemic attack) 11/2014  . Type II diabetes mellitus (HCC)           Past Surgical History:  Procedure Laterality Date  . BREAST BIOPSY Left ~ 2015    benign  . CARPAL TUNNEL RELEASE Bilateral    . DILATION AND CURETTAGE OF UTERUS      . KNEE ARTHROSCOPY Right 2003    in [place]  . LAPAROSCOPIC CHOLECYSTECTOMY      . TUBAL LIGATION      . VAGINAL HYSTERECTOMY   1982           Family History  Problem Relation Age of Onset  .  Stroke Father    . Stroke Brother    . Cancer Other        SOCIAL HISTORY: Social History         Social History  . Marital status: Married      Spouse name: N/A  . Number of children: N/A  . Years of education: N/A       Occupational History  . Not on file.        Social History Main Topics  . Smoking status: Never Smoker  . Smokeless tobacco: Never Used  . Alcohol use No  . Drug use: No  . Sexual activity: No        Other Topics Concern  . Not on file       Social History Narrative    Recently moved from [place]           Allergies  Allergen Reactions  . Ace Inhibitors Swelling  . Metformin And Related Other (See Comments)  chills            Current Outpatient Prescriptions  Medication Sig Dispense Refill  . albuterol (PROVENTIL HFA;VENTOLIN HFA) 108 (90 Base) MCG/ACT inhaler Inhale 2 puffs into the lungs every 6 (six) hours as needed for wheezing or shortness of breath.      . allopurinol (ZYLOPRIM) 300 MG tablet Take 300 mg by mouth daily.      Marland Kitchen. aspirin 325 MG tablet Take 325 mg by mouth daily.      Marland Kitchen. atenolol (TENORMIN) 25 MG tablet Take 25-50 mg by mouth 2 (two) times daily. Take 50 mg every morning and then 25 mg every evening      . bisacodyl (DULCOLAX) 5 MG EC tablet Take 5 mg by mouth daily as needed for moderate constipation.      . clonazePAM (KLONOPIN) 0.5 MG tablet Take 1 tablet (0.5 mg total) by mouth daily as needed for anxiety. 10 tablet 0  . cyclobenzaprine (FLEXERIL) 10 MG tablet Take 10 mg by mouth 3 (three) times daily as needed for muscle spasms.      . diclofenac sodium (VOLTAREN) 1 % GEL Apply 4 g topically 4 (four) times daily. (Patient taking differently: Apply 4 g topically 4 (four) times daily as needed (pain). ) 1 Tube 1  . gabapentin (NEURONTIN) 300 MG capsule Take 1 capsule (300 mg total) by mouth 2 (two) times daily. 60 capsule 2  . hydrocerin (EUCERIN) CREA Apply 1 application topically daily. 228 g 0  . insulin  degludec (TRESIBA FLEXTOUCH) 100 UNIT/ML SOPN FlexTouch Pen Inject 0.75 mLs (75 Units total) into the skin daily. (Patient taking differently: Inject 75 Units into the skin every evening. ) 3 mL 1  . Insulin Glulisine (APIDRA SOLOSTAR) 100 UNIT/ML Solostar Pen Inject 15 Units into the skin 2 (two) times daily.      Marland Kitchen. NIFEdipine (PROCARDIA XL/ADALAT-CC) 90 MG 24 hr tablet Take 90 mg by mouth daily.      . polyethylene glycol (MIRALAX / GLYCOLAX) packet Take 17 g by mouth daily as needed for moderate constipation.        No current facility-administered medications for this visit.       ROS:    General:  No weight loss, Fever, chills   HEENT: + recent headaches, no nasal bleeding, no visual changes, no sore throat   Neurologic: No dizziness, blackouts, seizures. No recent symptoms of stroke or mini- stroke. No recent episodes of slurred speech, or temporary blindness.   Cardiac: No recent episodes of chest pain/pressure, no shortness of breath at rest.   + shortness of breath with exertion.  Denies history of atrial fibrillation or irregular heartbeat   Vascular: No history of rest pain in feet.  No history of claudication.  No history of non-healing ulcer, No history of DVT    Pulmonary: No home oxygen, no productive cough, no hemoptysis,  No asthma or wheezing   Musculoskeletal:  [X]  Arthritis, [X]  Low back pain,  [X]  Joint pain   Hematologic:No history of hypercoagulable state.  No history of easy bleeding.  No history of anemia   Gastrointestinal: No hematochezia or melena,  No gastroesophageal reflux, no trouble swallowing   Urinary: [ ]  chronic Kidney disease, [ ]  on HD - [ ]  MWF or [ ]  TTHS, [ ]  Burning with urination, [ ]  Frequent urination, [ ]  Difficulty urinating;    Skin: No rashes   Psychological: No history of anxiety,  No history of depression  Physical Examination    Vitals:   08/12/16 0832  BP: (!) 111/55  Pulse: 77  Resp: 18  Temp: 97.6 F (36.4 C)    TempSrc: Oral  SpO2: 97%  Weight: (!) 321 lb (145.6 kg)  Height: 5\' 2"  (1.575 m)   General:  Alert and oriented, no acute distress HEENT: Normal Neck: No bruit or JVD Pulmonary: Clear to auscultation bilaterally Cardiac: Regular Rate and Rhythm without murmur Abdomen: Soft, non-tender, non-distended, obese Skin: No rash Extremity Pulses:  2+ radial, brachial, pulses bilaterally Musculoskeletal: No deformity or edema      Neurologic: Upper and lower extremity motor 5/5 and symmetric     ASSESSMENT:  Needs right temporal artery biopsy to rule out temporal arteritis     PLAN:  Right temporal artery biopsy.  Risks benefits possible, the patient's procedure details were explained to the patient today including not limited to bleeding infection she understands and agrees to proceed     Fabienne Bruns, MD [facility] Office: [phone] Pager: [phone]

## 2016-08-12 NOTE — Anesthesia Preprocedure Evaluation (Deleted)
Anesthesia Evaluation    Airway       Comment: Hx of jaw pain Dental   Pulmonary asthma ,           Cardiovascular Exercise Tolerance: Poor hypertension, Pt. on medications + Peripheral Vascular Disease       Neuro/Psych  Headaches, PSYCHIATRIC DISORDERS Anxiety Depression TIA   GI/Hepatic hiatal hernia,   Endo/Other  diabetes, Poorly Controlled, Type 2, Insulin DependentMorbid obesity  Renal/GU      Musculoskeletal  (+) Arthritis ,   Abdominal (+) + obese,   Peds  Hematology   Anesthesia Other Findings   Reproductive/Obstetrics                             BP Readings from Last 3 Encounters:  07/07/16 128/80  06/04/16 140/78  06/01/16 131/80   Lab Results  Component Value Date   WBC 7.6 07/05/2016   HGB 11.1 (L) 07/05/2016   HCT 35.0 (L) 07/05/2016   MCV 89.7 07/05/2016   PLT 325 07/05/2016     Chemistry      Component Value Date/Time   NA 139 07/05/2016 0328   K 3.7 07/05/2016 0328   CL 107 07/05/2016 0328   CO2 26 07/05/2016 0328   BUN 9 07/05/2016 0328   CREATININE 0.94 07/05/2016 0328   CREATININE 0.88 01/25/2016 0001      Component Value Date/Time   CALCIUM 9.2 07/05/2016 0328   ALKPHOS 100 07/04/2016 1848   AST 15 07/04/2016 1848   ALT 13 (L) 07/04/2016 1848   BILITOT 0.4 07/04/2016 1848     Wt Readings from Last 3 Encounters:  07/04/16 (!) 320 lb (145.2 kg)  06/04/16 (!) 316 lb (143.3 kg)  05/30/16 (!) 316 lb 11.2 oz (143.7 kg)    Anesthesia Physical Anesthesia Plan Anesthesia Quick Evaluation

## 2016-08-13 ENCOUNTER — Encounter (HOSPITAL_COMMUNITY): Payer: Self-pay | Admitting: Vascular Surgery

## 2016-08-15 ENCOUNTER — Other Ambulatory Visit: Payer: Medicare PPO

## 2016-09-09 DIAGNOSIS — R600 Localized edema: Secondary | ICD-10-CM | POA: Insufficient documentation

## 2016-09-23 ENCOUNTER — Encounter (HOSPITAL_BASED_OUTPATIENT_CLINIC_OR_DEPARTMENT_OTHER): Payer: Medicare PPO | Attending: Surgery

## 2016-09-23 DIAGNOSIS — E1165 Type 2 diabetes mellitus with hyperglycemia: Secondary | ICD-10-CM | POA: Diagnosis not present

## 2016-09-23 DIAGNOSIS — E11622 Type 2 diabetes mellitus with other skin ulcer: Secondary | ICD-10-CM | POA: Diagnosis present

## 2016-09-23 DIAGNOSIS — L97521 Non-pressure chronic ulcer of other part of left foot limited to breakdown of skin: Secondary | ICD-10-CM | POA: Insufficient documentation

## 2016-09-23 DIAGNOSIS — Z6841 Body Mass Index (BMI) 40.0 and over, adult: Secondary | ICD-10-CM | POA: Insufficient documentation

## 2016-09-23 DIAGNOSIS — Z794 Long term (current) use of insulin: Secondary | ICD-10-CM | POA: Diagnosis not present

## 2016-09-23 DIAGNOSIS — I10 Essential (primary) hypertension: Secondary | ICD-10-CM | POA: Insufficient documentation

## 2016-09-23 DIAGNOSIS — I890 Lymphedema, not elsewhere classified: Secondary | ICD-10-CM | POA: Insufficient documentation

## 2016-09-23 DIAGNOSIS — Z7982 Long term (current) use of aspirin: Secondary | ICD-10-CM | POA: Insufficient documentation

## 2016-09-23 DIAGNOSIS — L97821 Non-pressure chronic ulcer of other part of left lower leg limited to breakdown of skin: Secondary | ICD-10-CM | POA: Insufficient documentation

## 2016-09-23 DIAGNOSIS — I87332 Chronic venous hypertension (idiopathic) with ulcer and inflammation of left lower extremity: Secondary | ICD-10-CM | POA: Insufficient documentation

## 2016-09-23 DIAGNOSIS — Z79899 Other long term (current) drug therapy: Secondary | ICD-10-CM | POA: Insufficient documentation

## 2016-09-23 DIAGNOSIS — E11621 Type 2 diabetes mellitus with foot ulcer: Secondary | ICD-10-CM | POA: Diagnosis not present

## 2016-09-25 ENCOUNTER — Ambulatory Visit (HOSPITAL_COMMUNITY)
Admission: RE | Admit: 2016-09-25 | Discharge: 2016-09-25 | Disposition: A | Discharge status: Home / Self Care | Payer: Medicare PPO | Source: Ambulatory Visit | Attending: Vascular Surgery | Admitting: Vascular Surgery

## 2016-09-25 ENCOUNTER — Other Ambulatory Visit: Payer: Self-pay | Admitting: Surgery

## 2016-09-25 DIAGNOSIS — Z86718 Personal history of other venous thrombosis and embolism: Secondary | ICD-10-CM | POA: Insufficient documentation

## 2016-09-25 DIAGNOSIS — M7989 Other specified soft tissue disorders: Secondary | ICD-10-CM | POA: Diagnosis present

## 2016-09-25 DIAGNOSIS — I890 Lymphedema, not elsewhere classified: Secondary | ICD-10-CM

## 2016-09-25 DIAGNOSIS — R590 Localized enlarged lymph nodes: Secondary | ICD-10-CM | POA: Insufficient documentation

## 2016-10-01 DIAGNOSIS — E11622 Type 2 diabetes mellitus with other skin ulcer: Secondary | ICD-10-CM | POA: Diagnosis not present

## 2016-10-08 DIAGNOSIS — E11622 Type 2 diabetes mellitus with other skin ulcer: Secondary | ICD-10-CM | POA: Diagnosis not present

## 2016-10-10 ENCOUNTER — Encounter (HOSPITAL_COMMUNITY): Payer: Medicare PPO

## 2016-10-15 ENCOUNTER — Encounter (HOSPITAL_BASED_OUTPATIENT_CLINIC_OR_DEPARTMENT_OTHER): Payer: Medicare PPO | Attending: Surgery

## 2016-10-15 DIAGNOSIS — E11622 Type 2 diabetes mellitus with other skin ulcer: Secondary | ICD-10-CM | POA: Diagnosis not present

## 2016-10-15 DIAGNOSIS — I10 Essential (primary) hypertension: Secondary | ICD-10-CM | POA: Diagnosis not present

## 2016-10-15 DIAGNOSIS — F329 Major depressive disorder, single episode, unspecified: Secondary | ICD-10-CM | POA: Insufficient documentation

## 2016-10-15 DIAGNOSIS — J45909 Unspecified asthma, uncomplicated: Secondary | ICD-10-CM | POA: Diagnosis not present

## 2016-10-15 DIAGNOSIS — I87322 Chronic venous hypertension (idiopathic) with inflammation of left lower extremity: Secondary | ICD-10-CM | POA: Insufficient documentation

## 2016-10-15 DIAGNOSIS — M109 Gout, unspecified: Secondary | ICD-10-CM | POA: Insufficient documentation

## 2016-10-15 DIAGNOSIS — I890 Lymphedema, not elsewhere classified: Secondary | ICD-10-CM | POA: Insufficient documentation

## 2016-10-15 DIAGNOSIS — Z6841 Body Mass Index (BMI) 40.0 and over, adult: Secondary | ICD-10-CM | POA: Insufficient documentation

## 2016-10-22 DIAGNOSIS — E11622 Type 2 diabetes mellitus with other skin ulcer: Secondary | ICD-10-CM | POA: Diagnosis not present

## 2016-10-29 DIAGNOSIS — E11622 Type 2 diabetes mellitus with other skin ulcer: Secondary | ICD-10-CM | POA: Diagnosis not present

## 2016-11-03 ENCOUNTER — Other Ambulatory Visit (HOSPITAL_COMMUNITY): Payer: Self-pay | Admitting: Family Medicine

## 2016-11-03 ENCOUNTER — Ambulatory Visit (HOSPITAL_COMMUNITY)
Admission: RE | Admit: 2016-11-03 | Discharge: 2016-11-03 | Disposition: A | Discharge status: Home / Self Care | Payer: Medicare PPO | Source: Ambulatory Visit | Attending: Vascular Surgery | Admitting: Vascular Surgery

## 2016-11-03 DIAGNOSIS — S81809A Unspecified open wound, unspecified lower leg, initial encounter: Secondary | ICD-10-CM

## 2016-11-03 DIAGNOSIS — X58XXXA Exposure to other specified factors, initial encounter: Secondary | ICD-10-CM | POA: Diagnosis not present

## 2016-11-05 DIAGNOSIS — E11622 Type 2 diabetes mellitus with other skin ulcer: Secondary | ICD-10-CM | POA: Diagnosis not present

## 2016-11-12 DIAGNOSIS — E11622 Type 2 diabetes mellitus with other skin ulcer: Secondary | ICD-10-CM | POA: Diagnosis not present

## 2016-11-19 ENCOUNTER — Encounter (HOSPITAL_BASED_OUTPATIENT_CLINIC_OR_DEPARTMENT_OTHER): Payer: Medicare PPO | Attending: Surgery

## 2016-11-19 DIAGNOSIS — L97821 Non-pressure chronic ulcer of other part of left lower leg limited to breakdown of skin: Secondary | ICD-10-CM | POA: Diagnosis not present

## 2016-11-19 DIAGNOSIS — I10 Essential (primary) hypertension: Secondary | ICD-10-CM | POA: Diagnosis not present

## 2016-11-19 DIAGNOSIS — I890 Lymphedema, not elsewhere classified: Secondary | ICD-10-CM | POA: Insufficient documentation

## 2016-11-19 DIAGNOSIS — I87322 Chronic venous hypertension (idiopathic) with inflammation of left lower extremity: Secondary | ICD-10-CM | POA: Diagnosis not present

## 2016-11-19 DIAGNOSIS — E11622 Type 2 diabetes mellitus with other skin ulcer: Secondary | ICD-10-CM | POA: Insufficient documentation

## 2016-11-26 DIAGNOSIS — E11622 Type 2 diabetes mellitus with other skin ulcer: Secondary | ICD-10-CM | POA: Diagnosis not present

## 2016-12-03 DIAGNOSIS — E11622 Type 2 diabetes mellitus with other skin ulcer: Secondary | ICD-10-CM | POA: Diagnosis not present

## 2016-12-18 ENCOUNTER — Other Ambulatory Visit: Payer: Self-pay | Admitting: Family Medicine

## 2017-02-04 NOTE — Addendum Note (Signed)
Addendum  created 02/04/17 1157 by Wofford Stratton, MD   Sign clinical note    

## 2017-02-25 ENCOUNTER — Emergency Department (HOSPITAL_COMMUNITY): Payer: Medicare PPO

## 2017-02-25 ENCOUNTER — Emergency Department (HOSPITAL_COMMUNITY)
Admission: EM | Admit: 2017-02-25 | Discharge: 2017-02-26 | Disposition: A | Discharge status: Home / Self Care | Payer: Medicare PPO | Attending: Emergency Medicine | Admitting: Emergency Medicine

## 2017-02-25 ENCOUNTER — Encounter (HOSPITAL_COMMUNITY): Payer: Self-pay

## 2017-02-25 ENCOUNTER — Encounter (HOSPITAL_BASED_OUTPATIENT_CLINIC_OR_DEPARTMENT_OTHER): Payer: Medicare PPO | Attending: Surgery

## 2017-02-25 DIAGNOSIS — I87331 Chronic venous hypertension (idiopathic) with ulcer and inflammation of right lower extremity: Secondary | ICD-10-CM | POA: Diagnosis not present

## 2017-02-25 DIAGNOSIS — L97829 Non-pressure chronic ulcer of other part of left lower leg with unspecified severity: Secondary | ICD-10-CM | POA: Insufficient documentation

## 2017-02-25 DIAGNOSIS — L97819 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 leg with unspecified severity: Secondary | ICD-10-CM | POA: Insufficient documentation

## 2017-02-25 DIAGNOSIS — Z79899 Other long term (current) drug therapy: Secondary | ICD-10-CM | POA: Insufficient documentation

## 2017-02-25 DIAGNOSIS — R531 Weakness: Secondary | ICD-10-CM | POA: Insufficient documentation

## 2017-02-25 DIAGNOSIS — E119 Type 2 diabetes mellitus without complications: Secondary | ICD-10-CM | POA: Diagnosis not present

## 2017-02-25 DIAGNOSIS — M25511 Pain in right shoulder: Secondary | ICD-10-CM | POA: Diagnosis not present

## 2017-02-25 DIAGNOSIS — I87332 Chronic venous hypertension (idiopathic) with ulcer and inflammation of left lower extremity: Secondary | ICD-10-CM | POA: Diagnosis not present

## 2017-02-25 DIAGNOSIS — E11622 Type 2 diabetes mellitus with other skin ulcer: Secondary | ICD-10-CM | POA: Diagnosis not present

## 2017-02-25 DIAGNOSIS — I10 Essential (primary) hypertension: Secondary | ICD-10-CM | POA: Diagnosis not present

## 2017-02-25 DIAGNOSIS — E1151 Type 2 diabetes mellitus with diabetic peripheral angiopathy without gangrene: Secondary | ICD-10-CM | POA: Diagnosis not present

## 2017-02-25 DIAGNOSIS — Z8673 Personal history of transient ischemic attack (TIA), and cerebral infarction without residual deficits: Secondary | ICD-10-CM | POA: Diagnosis not present

## 2017-02-25 DIAGNOSIS — M79642 Pain in left hand: Secondary | ICD-10-CM | POA: Insufficient documentation

## 2017-02-25 DIAGNOSIS — J45909 Unspecified asthma, uncomplicated: Secondary | ICD-10-CM | POA: Diagnosis not present

## 2017-02-25 DIAGNOSIS — I890 Lymphedema, not elsewhere classified: Secondary | ICD-10-CM | POA: Diagnosis not present

## 2017-02-25 DIAGNOSIS — Z7982 Long term (current) use of aspirin: Secondary | ICD-10-CM | POA: Insufficient documentation

## 2017-02-25 DIAGNOSIS — R51 Headache: Secondary | ICD-10-CM | POA: Insufficient documentation

## 2017-02-25 LAB — I-STAT TROPONIN, ED: Troponin i, poc: 0.01 ng/mL (ref 0.00–0.08)

## 2017-02-25 LAB — DIFFERENTIAL
BASOS ABS: 0 10*3/uL (ref 0.0–0.1)
BASOS PCT: 0 %
EOS ABS: 0.3 10*3/uL (ref 0.0–0.7)
EOS PCT: 4 %
Lymphocytes Relative: 21 %
Lymphs Abs: 2.1 10*3/uL (ref 0.7–4.0)
Monocytes Absolute: 0.7 10*3/uL (ref 0.1–1.0)
Monocytes Relative: 7 %
NEUTROS PCT: 68 %
Neutro Abs: 6.8 10*3/uL (ref 1.7–7.7)

## 2017-02-25 LAB — CBC
HCT: 36.8 % (ref 36.0–46.0)
Hemoglobin: 12.1 g/dL (ref 12.0–15.0)
MCH: 29.1 pg (ref 26.0–34.0)
MCHC: 32.9 g/dL (ref 30.0–36.0)
MCV: 88.5 fL (ref 78.0–100.0)
PLATELETS: 361 10*3/uL (ref 150–400)
RBC: 4.16 MIL/uL (ref 3.87–5.11)
RDW: 16.2 % — AB (ref 11.5–15.5)
WBC: 9.9 10*3/uL (ref 4.0–10.5)

## 2017-02-25 LAB — URINALYSIS, ROUTINE W REFLEX MICROSCOPIC
Bilirubin Urine: NEGATIVE
GLUCOSE, UA: NEGATIVE mg/dL
Hgb urine dipstick: NEGATIVE
Ketones, ur: NEGATIVE mg/dL
LEUKOCYTES UA: NEGATIVE
NITRITE: NEGATIVE
PROTEIN: NEGATIVE mg/dL
Specific Gravity, Urine: 1.011 (ref 1.005–1.030)
pH: 6 (ref 5.0–8.0)

## 2017-02-25 LAB — I-STAT CHEM 8, ED
BUN: 14 mg/dL (ref 6–20)
CREATININE: 0.9 mg/dL (ref 0.44–1.00)
Calcium, Ion: 1.23 mmol/L (ref 1.15–1.40)
Chloride: 105 mmol/L (ref 101–111)
Glucose, Bld: 133 mg/dL — ABNORMAL HIGH (ref 65–99)
HEMATOCRIT: 39 % (ref 36.0–46.0)
Hemoglobin: 13.3 g/dL (ref 12.0–15.0)
POTASSIUM: 4.1 mmol/L (ref 3.5–5.1)
SODIUM: 139 mmol/L (ref 135–145)
TCO2: 24 mmol/L (ref 22–32)

## 2017-02-25 LAB — COMPREHENSIVE METABOLIC PANEL
ALBUMIN: 3.4 g/dL — AB (ref 3.5–5.0)
ALT: 16 U/L (ref 14–54)
ANION GAP: 7 (ref 5–15)
AST: 15 U/L (ref 15–41)
Alkaline Phosphatase: 108 U/L (ref 38–126)
BILIRUBIN TOTAL: 0.4 mg/dL (ref 0.3–1.2)
BUN: 15 mg/dL (ref 6–20)
CHLORIDE: 104 mmol/L (ref 101–111)
CO2: 25 mmol/L (ref 22–32)
Calcium: 9.4 mg/dL (ref 8.9–10.3)
Creatinine, Ser: 0.97 mg/dL (ref 0.44–1.00)
GFR calc Af Amer: >60 mL/min (ref >60)
GFR calc non Af Amer: 59 mL/min — ABNORMAL LOW (ref >60)
Glucose, Bld: 134 mg/dL — ABNORMAL HIGH (ref 65–99)
POTASSIUM: 4 mmol/L (ref 3.5–5.1)
SODIUM: 136 mmol/L (ref 135–145)
Total Protein: 8.8 g/dL — ABNORMAL HIGH (ref 6.5–8.1)

## 2017-02-25 LAB — GLUCOSE, CAPILLARY: GLUCOSE-CAPILLARY: 125 mg/dL — AB (ref 65–99)

## 2017-02-25 LAB — ETHANOL: Alcohol, Ethyl (B): <5 mg/dL (ref <5)

## 2017-02-25 LAB — PROTIME-INR
INR: 1.13
PROTHROMBIN TIME: 14.4 s (ref 11.4–15.2)

## 2017-02-25 LAB — CBG MONITORING, ED: GLUCOSE-CAPILLARY: 178 mg/dL — AB (ref 65–99)

## 2017-02-25 LAB — APTT: APTT: 35 s (ref 24–36)

## 2017-02-25 MED ORDER — LORAZEPAM 1 MG PO TABS: 2.0000 mg | ORAL_TABLET | Freq: Once | ORAL | Status: DC

## 2017-02-25 MED ORDER — INSULIN ASPART PROT & ASPART (70-30 MIX) 100 UNIT/ML ~~LOC~~ SUSP: 7.0000 [IU] | Freq: Once | SUBCUTANEOUS | Status: DC

## 2017-02-25 MED ORDER — FUROSEMIDE 40 MG PO TABS
20.0000 mg | ORAL_TABLET | Freq: Once | ORAL | Status: AC
  Administered 2017-02-25: 20 mg via ORAL
  Filled 2017-02-25: qty 0.5
  Filled 2017-02-25: qty 1

## 2017-02-25 MED ORDER — NIFEDIPINE ER 60 MG PO TB24
90.0000 mg | ORAL_TABLET | Freq: Once | ORAL | Status: AC
  Administered 2017-02-25: 90 mg via ORAL
  Filled 2017-02-25 (×4): qty 1

## 2017-02-25 MED ORDER — LORAZEPAM 0.5 MG PO TABS
0.5000 mg | ORAL_TABLET | Freq: Once | ORAL | Status: AC | PRN
  Administered 2017-02-25: 0.5 mg via ORAL
  Filled 2017-02-25: qty 1

## 2017-02-25 MED ORDER — INSULIN ASPART 100 UNIT/ML ~~LOC~~ SOLN
7.0000 [IU] | Freq: Once | SUBCUTANEOUS | Status: AC
  Administered 2017-02-25: 7 [IU] via SUBCUTANEOUS
  Filled 2017-02-25: qty 1

## 2017-02-25 MED ORDER — ATENOLOL 50 MG PO TABS
50.0000 mg | ORAL_TABLET | Freq: Once | ORAL | Status: AC
  Administered 2017-02-25: 50 mg via ORAL
  Filled 2017-02-25 (×2): qty 1

## 2017-02-25 MED ORDER — INSULIN ASPART PROT & ASPART (70-30 MIX) 100 UNIT/ML ~~LOC~~ SUSP: 17.0000 [IU] | Freq: Once | SUBCUTANEOUS | Status: DC

## 2017-02-25 NOTE — ED Triage Notes (Signed)
Patient c/o headache and arm pain x 3 days. Patient states she is unable to extend her right arm completely out.

## 2017-02-25 NOTE — ED Provider Notes (Signed)
Unfortunately, [facility]  She does have some weakness to the right side, so I [facility] with pt [facility] (they have a bigger one which she has fit in the past).  The pt is in agreement.  Pt d/w Dr. Jacqulyn BathLong who will accept her for transfer.   Jacalyn LefevreHaviland, Alyannah Sanks, MD 02/25/17 1904

## 2017-02-25 NOTE — ED Notes (Signed)
Pt asking to leave, EDP made aware

## 2017-02-25 NOTE — ED Provider Notes (Addendum)
[facility]-EMERGENCY DEPT Provider Note   CSN: [id number] Arrival date & time: 02/25/17  1045     History   Chief Complaint Chief Complaint  Patient presents with  . Headache  . Arm Pain  . Extremity Weakness    HPI [name] is a 66 y.o. female.  66yo F w/ PMH including HTN, TIA, IDDM, gout who p/w arm pain and headache. She reports that 3 days ago she began having tingling and pain in L hand which eventually moved to R hand and settled into shoulder. She has had some pain radiating up the side of her neck into her head. She has had intermittent, gradual onset of pressure like mild headache. She has tried tylenol and gabapentin without relief, last dose around midnight last night. She has been unable to extend her right arm. She denies any fevers, vomiting, chest pain, shortness of breath, or recent illness. She has not taken her medications this morning.   The history is provided by the patient.  Headache    Arm Pain  Associated symptoms include headaches.  Extremity Weakness  Associated symptoms include headaches.    Past Medical History:  Diagnosis Date  . Anxiety   . Arthritis    "back, arms, legs" (03/24/2016)  . Asthma   . Chronic lower back pain   . Colonic polyp    last colonoscopy done in 2009 with normal results per medical record  . Depressive disorder   . Gastric polyp   . Gout    has taken allopurinol  once daily in past  . Headache   . History of hiatal hernia   . Hypertension   . Migraine    "none in awhile; might have a couple/year" (03/24/2016)  . Mixed hyperlipidemia    01/2016 Total chol 141, HDL 59, LDL 63, ration 1.1  . Osteoarthritis   . TIA (transient ischemic attack) 11/2014  . Type II diabetes mellitus [facility])     Patient Active Problem List   Diagnosis Date Noted  . Cellulitis 07/05/2016  . Depression with anxiety 05/30/2016  . 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 Generalized weakness 05/30/2016  . Left arm  weakness 05/30/2016  . Left Lower extremity pain  04/02/2016  . Lower extremity pain, inferior, left 04/02/2016  . Left leg pain   . Personal history of noncompliance with medical treatment, presenting hazards to health 03/31/2016  . Jaw pain 03/27/2016  . Left leg cellulitis 03/24/2016  . Cellulitis of left lower extremity   . Primary insomnia   . Adjustment disorder   . Morbid obesity (HCC) 01/27/2016  . Wheelchair dependent 01/27/2016  . Gout 01/27/2016  . Asthma 01/27/2016  . Arthritis 01/27/2016  . Uncontrolled diabetes mellitus type 2 with peripheral artery disease (HCC) 01/25/2016  . Cellulitis of left leg 01/11/2016  . Insulin-requiring or dependent type II diabetes mellitus (HCC) 01/11/2016  . Hypertension 01/11/2016    Past Surgical History:  Procedure Laterality Date  . ARTERY BIOPSY Right 08/12/2016   Procedure: BIOPSY TEMPORAL ARTERY;  Surgeon: Sherren Kerns, MD;  Location[facility] OR;  Service: Vascular;  Laterality: Right;  BIOPSY TEMPORAL ARTERY  . BREAST BIOPSY Left ~ 2015   benign  . CARPAL TUNNEL RELEASE Bilateral   . DILATION AND CURETTAGE OF UTERUS    . KNEE ARTHROSCOPY Right 2003   in [place]  . LAPAROSCOPIC CHOLECYSTECTOMY    . TUBAL LIGATION    . VAGINAL HYSTERECTOMY  1982  OB History    No data available       Home Medications    Prior to Admission medications   Medication Sig Start Date End Date Taking? Authorizing Provider  acetaminophen (TYLENOL) 500 MG tablet Take 500 mg by mouth daily as needed for moderate pain.   Yes [provider]  albuterol (PROVENTIL HFA;VENTOLIN HFA) 108 (90 Base) MCG/ACT inhaler Inhale 2 puffs into the lungs every 4 (four) hours as needed for wheezing or shortness of breath.    Yes [provider]  allopurinol (ZYLOPRIM) 300 MG tablet Take 300 mg by mouth daily.   Yes [provider]  aspirin 325 MG tablet Take 325 mg by mouth daily.   Yes [provider]  atenolol (TENORMIN) 25 MG  tablet Take 25-50 mg by mouth 2 (two) times daily. Take 50 mg every morning and then 25 mg every evening   Yes [provider]  cyclobenzaprine (FLEXERIL) 10 MG tablet Take 10 mg by mouth at bedtime as needed for muscle spasms.    Yes [provider]  diclofenac sodium (VOLTAREN) 1 % GEL Apply 4 g topically 4 (four) times daily. Patient taking differently: Apply 4 g topically 4 (four) times daily as needed (pain).  02/25/16  Yes Henson, [name] L, NP-C  furosemide (LASIX) 20 MG tablet Take 1 tablet (20 mg total) by mouth daily. 07/07/16  Yes Leroy Sea, MD  gabapentin (NEURONTIN) 300 MG capsule Take 1 capsule (300 mg total) by mouth 2 (two) times daily. 01/31/16  Yes Henson, [name] L, NP-C  hydrocerin (EUCERIN) CREA Apply 1 application topically daily. 01/15/16  Yes Leroy Sea, MD  insulin degludec (TRESIBA FLEXTOUCH) 100 UNIT/ML SOPN FlexTouch Pen Inject 0.75 mLs (75 Units total) into the skin daily. Patient taking differently: Inject 75 Units into the skin every morning.  03/14/16  Yes Henson, [name] L, NP-C  NIFEdipine (PROCARDIA XL/ADALAT-CC) 90 MG 24 hr tablet Take 90 mg by mouth daily.   Yes [provider]  NOVOLOG FLEXPEN 100 UNIT/ML FlexPen Take 17 Units by mouth 2 (two) times daily. 01/30/17  Yes [provider]  potassium chloride (K-DUR) 10 MEQ tablet Take 1 tablet (10 mEq total) by mouth daily. 07/07/16  Yes Leroy Sea, MD  Cholecalciferol (VITAMIN D3) 2000 units capsule Take 2,000 Units by mouth daily.    [provider]  clonazePAM (KLONOPIN) 0.5 MG tablet Take 1 tablet (0.5 mg total) by mouth daily as needed for anxiety. Patient not taking: Reported on 08/08/2016 06/01/16   Edsel Petrin, DO  magnesium gluconate (MAGONATE) 500 MG tablet Take 500 mg by mouth daily.    [provider]  polyethylene glycol (MIRALAX / GLYCOLAX) packet Take 17 g by mouth daily as needed for moderate constipation.    [provider]    Family History Family History  Problem Relation Age of Onset  . Stroke Father   . Stroke Brother   . Cancer Other     Social History Social History  Substance Use Topics  . Smoking status: Never Smoker  . Smokeless tobacco: Never Used  . Alcohol use No     Allergies   Ace inhibitors; Metformin and related; and Propofol   Review of Systems Review of Systems  Musculoskeletal: Positive for extremity weakness.  Neurological: Positive for headaches.   All other systems reviewed and are negative except that which was mentioned in HPI   Physical Exam Updated Vital Signs BP (!) 154/82 (BP Location:  Right Arm)   Pulse 88   Temp 98.5 F (36.9 C) (Oral)   Resp 17   Ht 5\' 5"  (1.651 m)   Wt (!) 145.2 kg (320 lb)   SpO2 97%   BMI 53.25 kg/m   Physical Exam  Constitutional: She is oriented to person, place, and time. She appears well-developed and well-nourished. No distress.  HENT:  Head: Normocephalic and atraumatic.  Moist mucous membranes  Eyes: Pupils are equal, round, and reactive to light. Conjunctivae are normal.  Neck: Neck supple.  Cardiovascular: Normal rate, regular rhythm and normal heart sounds.   Pulmonary/Chest: Effort normal and breath sounds normal.  Abdominal: Soft. Bowel sounds are normal. She exhibits no distension. There is no tenderness.  Musculoskeletal: She exhibits edema (BLE).  No focal shoulder tenderness but unable to raise arm above 90 degrees  Neurological: She is alert and oriented to person, place, and time.  Fluent speech Normal sensation throughout 4/5 strength RUE, RLE 5/5 strength LUE, LLE Dysmetria with R finger to nose testing  Skin: Skin is warm and dry.  Chronic venous stasis changes of b/l lower legs and feet L>R; small superficial healing ulceration medial L ankle  Psychiatric: She has a normal mood and affect. Judgment normal.  Nursing note and vitals reviewed.    ED Treatments / Results  Labs (all labs ordered  are listed, but only abnormal results are displayed) Labs Reviewed  CBC - Abnormal; Notable for the following:       Result Value   RDW 16.2 (*)    All other components within normal limits  COMPREHENSIVE METABOLIC PANEL - Abnormal; Notable for the following:    Glucose, Bld 134 (*)    Total Protein 8.8 (*)    Albumin 3.4 (*)    GFR calc non Af Amer 59 (*)    All other components within normal limits  I-STAT CHEM 8, ED - Abnormal; Notable for the following:    Glucose, Bld 133 (*)    All other components within normal limits  ETHANOL  PROTIME-INR  APTT  DIFFERENTIAL  URINALYSIS, ROUTINE W REFLEX MICROSCOPIC  I-STAT TROPONIN, ED    EKG  EKG Interpretation  Date/Time:  Wednesday February 25 2017 12:50:16 EDT Ventricular Rate:  97 PR Interval:    QRS Duration: 87 QT Interval:  350 QTC Calculation: 445 R Axis:   13 Text Interpretation:  Sinus rhythm Low voltage, precordial leads No significant change since last tracing Confirmed by Frederick PeersLittle, Rachel [id number]) on 02/25/2017 1:47:14 PM       Radiology Dg Shoulder Right  Result Date: 02/25/2017 CLINICAL DATA:  Right arm pain and weakness x3 days EXAM: RIGHT SHOULDER - 2+ VIEW COMPARISON:  None. FINDINGS: There is no evidence of fracture or dislocation. Mild degenerative spurring noted off the acromion at the [facility]. The included right lung and ribs are nonacute. Soft tissues are unremarkable. IMPRESSION: No acute fracture nor dislocation of the right shoulder. Minimal degenerative spurring off the [facility]romion at [facility]. Electronically Signed   By: Tollie Ethavid  Kwon M.D.   On: 02/25/2017 13:36   Ct Head Wo Contrast  Result Date: 02/25/2017 CLINICAL DATA:  Headache and arm pain for 3 days EXAM: CT HEAD WITHOUT CONTRAST TECHNIQUE: Contiguous axial images were obtained from the base of the skull through the vertex without intravenous contrast. COMPARISON:  None. FINDINGS: Brain: No evidence of acute infarction, hemorrhage, hydrocephalus,  extra-axial collection or mass lesion/mass effect. Suspect mild patchy low-density in the cerebral white  matter, vascular risk factors in the medical history favoring chronic microvascular ischemia. Normal brain volume. Vascular: No hyperdense vessel or unexpected calcification. Skull: No acute or aggressive finding Sinuses/Orbits: Negative IMPRESSION: 1. No acute finding. 2. Possible mild chronic small vessel ischemia in the cerebral white matter. Electronically Signed   By: Marnee Spring M.D.   On: 02/25/2017 13:52    Procedures Procedures (including critical care time)  Medications Ordered in ED Medications  LORazepam (ATIVAN) tablet 0.5 mg (not administered)     Initial Impression / Assessment and Plan / ED Course  I have reviewed the triage vital signs and the nursing notes.  Pertinent labs & imaging results that were available during my care of the patient were reviewed by me and considered in my medical decision making (see chart for details).    Pt w/ 3 days of R shoulder pain and difficulty raising arm associated with occasional mild headache. She had reassuring vital signs on exam and was comfortable.She was slightly weak on her right arm and right leg straight raise test. It was difficult to ascertain whether the right arm weakness was due to pain but it appeared that some of her limitation was not related to shoulder pain but rather true weakness. Her labs and head CT are negative for acute process to explain her findings.Shoulder film also negative acute. Her exam is difficult because she is not ambulatory at baseline, but she did appear to have R leg weakness compared to L. Patient has multiple risk factors for stroke and I am concerned about possibility of stroke given weakness on exam. I have ordered MRI brain, as well as MRI c-spine given arm weakness. I have also ordered the patient's home medications since she missed all of her doses. I'm signing out to the oncoming provider  who will follow-up on imaging. If all of herg is negative, I anticipate that she will be able to go home and she can follow-up regarding shoulder issues.  Final Clinical Impressions(s) / ED Diagnoses   Final diagnoses:  None    New Prescriptions New Prescriptions   No medications on file     Little, Ambrose Finland, MD 02/25/17 1643    Little, Ambrose Finland, MD 02/25/17 [phone]

## 2017-02-25 NOTE — ED Provider Notes (Signed)
Blood pressure (!) 147/88, pulse 87, temperature 98.5 F (36.9 C), temperature source Oral, resp. rate 17, height 5\' 5"  (1.651 m), weight (!) 145.2 kg (320 lb), SpO2 100 %.  [facility]  In short, [name] is a 66 y.o. female with a chief complaint of Headache; Arm Pain; and Extremity Weakness .  Refer to the original H&P for additional details.  The current plan of care is to obtain MRI of the head and cervical spine with some RUE and RLE weakness. Multiple CVA risk factors but complicating joint discomfort. Patient is awake and alert on arrival. Updated her in terms of plan. No needs at this time.    Alona BeneJoshua Pharrell Ledford, MD Emergency Medicine     Allisen Pidgeon, Arlyss RepressJoshua G, MD 02/26/17 [phone]

## 2017-02-25 NOTE — ED Notes (Signed)
Pt unable to urinate at this time. Will collect urine when pt urinates. Nurse aware.

## 2017-02-25 NOTE — ED Notes (Signed)
[facility] at bedside at this time.  

## 2017-02-25 NOTE — ED Notes (Signed)
Pt transported to MRI a this time

## 2017-02-26 ENCOUNTER — Emergency Department (HOSPITAL_COMMUNITY): Payer: Medicare PPO

## 2017-02-26 ENCOUNTER — Emergency Department: Payer: Medicare PPO

## 2017-02-26 MED ORDER — HYDROCODONE-ACETAMINOPHEN 5-325 MG PO TABS: 1.0000 | ORAL_TABLET | Freq: Four times a day (QID) | ORAL | 0 refills | Status: DC | PRN

## 2017-02-26 MED ORDER — LORAZEPAM 2 MG/ML IJ SOLN
1.0000 mg | Freq: Once | INTRAMUSCULAR | Status: AC
  Administered 2017-02-26: 1 mg via INTRAVENOUS
  Filled 2017-02-26: qty 1

## 2017-02-26 MED ORDER — MORPHINE SULFATE (PF) 4 MG/ML IV SOLN
4.0000 mg | Freq: Once | INTRAVENOUS | Status: AC
  Administered 2017-02-26: 4 mg via INTRAVENOUS

## 2017-02-26 NOTE — ED Provider Notes (Addendum)
12:45 AM  Assumed care from Dr. Jacqulyn BathLong.  Patient is a 66 year old female with history of hypertension, diabetes, TIA who presented to the emergency department with right-sided weakness. Complicated by the fact that patient has joint pain on exam. Plan is for MRI of her brain and cervical spine. [facility] given she needed a larger MRI.  Patient currently in MRI.  1:10 AM  Pt sent back from MRI because of right arm pain and refusing to stay in the scanner. We will give her a dose of morphine and then send her back for MRI. She thinks after pain medication she will be able to tolerate the MRI.  3:40 AM  Pt has had a lot of difficulty with MRI. We were able to obtain some images of MRI of her brain which showed no acute abnormality. Unable to obtain MRI of her cervical spine and at this time I do not think she has spinal stenosis, cauda equina, epidural abscess or hematoma, discitis.  This could be radiculopathy but I feel this can be worked up as an outpatient. I suspect that her pain is more likely coming from her joints. She agrees and states that she thinks that her difficulty lifting her arm is because of pain in her shoulder and she feels that now after pain medication she can move her right arm much more easily. She states that she thinks that the difficulty moving her legs is because of cellulitis which she reports has been treated and from diabetic neuropathy. She states she thinks her headache is because of stress. She is comfortable with the plan for discharge and close follow-up with her doctor.  I do not see any emergent reason for admission and I feel she is safe to be discharged. We'll discharge with short course of pain medication.   At this time, I do not feel there is any life-threatening condition present. I have reviewed and discussed all results (EKG, imaging, lab, urine as appropriate) and exam findings with patient/family. I have reviewed nursing  notes and appropriate previous records.  I feel the patient is safe to be discharged home without further emergent workup and can continue workup as an outpatient as needed. Discussed usual and customary return precautions. Patient/family verbalize understanding and are comfortable with this plan.  Outpatient follow-up has been provided if needed. All questions have been answered.    Ward, Layla MawKristen N, DO 02/26/17 0342    Ward, Layla MawKristen N, DO 02/26/17 [phone]

## 2017-03-03 DIAGNOSIS — E11622 Type 2 diabetes mellitus with other skin ulcer: Secondary | ICD-10-CM | POA: Diagnosis not present

## 2017-03-11 DIAGNOSIS — E11622 Type 2 diabetes mellitus with other skin ulcer: Secondary | ICD-10-CM | POA: Diagnosis not present

## 2017-03-15 ENCOUNTER — Encounter (HOSPITAL_COMMUNITY): Payer: Self-pay

## 2017-03-15 DIAGNOSIS — Z124 Encounter for screening for malignant neoplasm of cervix: Secondary | ICD-10-CM | POA: Insufficient documentation

## 2017-03-15 DIAGNOSIS — M479 Spondylosis, unspecified: Secondary | ICD-10-CM | POA: Insufficient documentation

## 2017-03-15 DIAGNOSIS — G8929 Other chronic pain: Secondary | ICD-10-CM | POA: Insufficient documentation

## 2017-03-15 DIAGNOSIS — J45909 Unspecified asthma, uncomplicated: Secondary | ICD-10-CM | POA: Diagnosis not present

## 2017-03-15 DIAGNOSIS — E11621 Type 2 diabetes mellitus with foot ulcer: Secondary | ICD-10-CM | POA: Insufficient documentation

## 2017-03-15 DIAGNOSIS — M109 Gout, unspecified: Secondary | ICD-10-CM | POA: Diagnosis not present

## 2017-03-15 DIAGNOSIS — K449 Diaphragmatic hernia without obstruction or gangrene: Secondary | ICD-10-CM | POA: Insufficient documentation

## 2017-03-15 DIAGNOSIS — Z90710 Acquired absence of both cervix and uterus: Secondary | ICD-10-CM | POA: Insufficient documentation

## 2017-03-15 DIAGNOSIS — Z888 Allergy status to other drugs, medicaments and biological substances status: Secondary | ICD-10-CM | POA: Insufficient documentation

## 2017-03-15 DIAGNOSIS — Z8673 Personal history of transient ischemic attack (TIA), and cerebral infarction without residual deficits: Secondary | ICD-10-CM | POA: Diagnosis not present

## 2017-03-15 DIAGNOSIS — I10 Essential (primary) hypertension: Secondary | ICD-10-CM | POA: Diagnosis not present

## 2017-03-15 DIAGNOSIS — L03116 Cellulitis of left lower limb: Secondary | ICD-10-CM | POA: Diagnosis not present

## 2017-03-15 DIAGNOSIS — I872 Venous insufficiency (chronic) (peripheral): Secondary | ICD-10-CM | POA: Diagnosis not present

## 2017-03-15 DIAGNOSIS — Z794 Long term (current) use of insulin: Secondary | ICD-10-CM | POA: Diagnosis not present

## 2017-03-15 DIAGNOSIS — L03119 Cellulitis of unspecified part of limb: Secondary | ICD-10-CM | POA: Diagnosis present

## 2017-03-15 DIAGNOSIS — Z7982 Long term (current) use of aspirin: Secondary | ICD-10-CM | POA: Diagnosis not present

## 2017-03-15 DIAGNOSIS — Z993 Dependence on wheelchair: Secondary | ICD-10-CM | POA: Diagnosis not present

## 2017-03-15 DIAGNOSIS — Z6841 Body Mass Index (BMI) 40.0 and over, adult: Secondary | ICD-10-CM | POA: Insufficient documentation

## 2017-03-15 DIAGNOSIS — Z9119 Patient's noncompliance with other medical treatment and regimen: Secondary | ICD-10-CM | POA: Diagnosis not present

## 2017-03-15 DIAGNOSIS — L97529 Non-pressure chronic ulcer of other part of left foot with unspecified severity: Secondary | ICD-10-CM | POA: Insufficient documentation

## 2017-03-15 DIAGNOSIS — M1990 Unspecified osteoarthritis, unspecified site: Secondary | ICD-10-CM | POA: Diagnosis not present

## 2017-03-15 DIAGNOSIS — Z884 Allergy status to anesthetic agent status: Secondary | ICD-10-CM | POA: Diagnosis not present

## 2017-03-15 DIAGNOSIS — Z9889 Other specified postprocedural states: Secondary | ICD-10-CM | POA: Diagnosis not present

## 2017-03-15 DIAGNOSIS — Z79899 Other long term (current) drug therapy: Secondary | ICD-10-CM | POA: Insufficient documentation

## 2017-03-15 DIAGNOSIS — L03115 Cellulitis of right lower limb: Secondary | ICD-10-CM | POA: Diagnosis not present

## 2017-03-15 DIAGNOSIS — Z823 Family history of stroke: Secondary | ICD-10-CM | POA: Insufficient documentation

## 2017-03-15 DIAGNOSIS — M545 Low back pain: Secondary | ICD-10-CM | POA: Diagnosis not present

## 2017-03-15 DIAGNOSIS — L97509 Non-pressure chronic ulcer of other part of unspecified foot with unspecified severity: Secondary | ICD-10-CM | POA: Diagnosis present

## 2017-03-15 LAB — I-STAT CG4 LACTIC ACID, ED: Lactic Acid, Venous: 1.13 mmol/L (ref 0.5–1.9)

## 2017-03-15 LAB — CBC WITH DIFFERENTIAL/PLATELET
Basophils Absolute: 0 10*3/uL (ref 0.0–0.1)
Basophils Relative: 0 %
EOS PCT: 2 %
Eosinophils Absolute: 0.2 10*3/uL (ref 0.0–0.7)
HCT: 40.7 % (ref 36.0–46.0)
Hemoglobin: 13.5 g/dL (ref 12.0–15.0)
LYMPHS ABS: 1.5 10*3/uL (ref 0.7–4.0)
LYMPHS PCT: 10 %
MCH: 29.5 pg (ref 26.0–34.0)
MCHC: 33.2 g/dL (ref 30.0–36.0)
MCV: 89.1 fL (ref 78.0–100.0)
MONO ABS: 0.8 10*3/uL (ref 0.1–1.0)
Monocytes Relative: 5 %
Neutro Abs: 12.1 10*3/uL — ABNORMAL HIGH (ref 1.7–7.7)
Neutrophils Relative %: 83 %
PLATELETS: 331 10*3/uL (ref 150–400)
RBC: 4.57 MIL/uL (ref 3.87–5.11)
RDW: 16.3 % — ABNORMAL HIGH (ref 11.5–15.5)
WBC: 14.6 10*3/uL — AB (ref 4.0–10.5)

## 2017-03-15 LAB — COMPREHENSIVE METABOLIC PANEL
ALK PHOS: 116 U/L (ref 38–126)
ALT: 14 U/L (ref 14–54)
ANION GAP: 8 (ref 5–15)
AST: 16 U/L (ref 15–41)
Albumin: 3.5 g/dL (ref 3.5–5.0)
BILIRUBIN TOTAL: 0.1 mg/dL — AB (ref 0.3–1.2)
BUN: 10 mg/dL (ref 6–20)
CHLORIDE: 106 mmol/L (ref 101–111)
CO2: 26 mmol/L (ref 22–32)
Calcium: 9.4 mg/dL (ref 8.9–10.3)
Creatinine, Ser: 0.82 mg/dL (ref 0.44–1.00)
GLUCOSE: 82 mg/dL (ref 65–99)
Potassium: 3.6 mmol/L (ref 3.5–5.1)
Sodium: 140 mmol/L (ref 135–145)
Total Protein: 9.5 g/dL — ABNORMAL HIGH (ref 6.5–8.1)

## 2017-03-15 LAB — CBG MONITORING, ED: Glucose-Capillary: 81 mg/dL (ref 65–99)

## 2017-03-15 NOTE — ED Triage Notes (Signed)
Pt BIB [facility]: Per EMS, pt is being treated with home health and outpt wound care. Pt discovered minimal left leg drainage and blood. Denies fever, chest pain, shortness of breath.   EMS VS: 140/90, 84, 18RR, 98% RA pain 7/10

## 2017-03-16 ENCOUNTER — Emergency Department (HOSPITAL_COMMUNITY): Payer: Medicare PPO

## 2017-03-16 ENCOUNTER — Observation Stay (HOSPITAL_COMMUNITY)
Admission: EM | Admit: 2017-03-16 | Discharge: 2017-03-18 | Disposition: A | Discharge status: Home / Self Care | Payer: Medicare PPO | Attending: Internal Medicine | Admitting: Internal Medicine

## 2017-03-16 DIAGNOSIS — L97509 Non-pressure chronic ulcer of other part of unspecified foot with unspecified severity: Secondary | ICD-10-CM

## 2017-03-16 DIAGNOSIS — L03116 Cellulitis of left lower limb: Secondary | ICD-10-CM | POA: Diagnosis not present

## 2017-03-16 DIAGNOSIS — E119 Type 2 diabetes mellitus without complications: Secondary | ICD-10-CM | POA: Diagnosis not present

## 2017-03-16 DIAGNOSIS — E11621 Type 2 diabetes mellitus with foot ulcer: Secondary | ICD-10-CM | POA: Diagnosis not present

## 2017-03-16 DIAGNOSIS — Z794 Long term (current) use of insulin: Secondary | ICD-10-CM

## 2017-03-16 DIAGNOSIS — Z993 Dependence on wheelchair: Secondary | ICD-10-CM

## 2017-03-16 DIAGNOSIS — L03119 Cellulitis of unspecified part of limb: Secondary | ICD-10-CM

## 2017-03-16 DIAGNOSIS — J45909 Unspecified asthma, uncomplicated: Secondary | ICD-10-CM | POA: Diagnosis not present

## 2017-03-16 DIAGNOSIS — L0390 Cellulitis, unspecified: Secondary | ICD-10-CM | POA: Diagnosis present

## 2017-03-16 LAB — CBC
HCT: 36.7 % (ref 36.0–46.0)
HEMOGLOBIN: 11.9 g/dL — AB (ref 12.0–15.0)
MCH: 28.9 pg (ref 26.0–34.0)
MCHC: 32.4 g/dL (ref 30.0–36.0)
MCV: 89.1 fL (ref 78.0–100.0)
PLATELETS: 291 10*3/uL (ref 150–400)
RBC: 4.12 MIL/uL (ref 3.87–5.11)
RDW: 16.3 % — ABNORMAL HIGH (ref 11.5–15.5)
WBC: 10.5 10*3/uL (ref 4.0–10.5)

## 2017-03-16 LAB — URINALYSIS, ROUTINE W REFLEX MICROSCOPIC
BILIRUBIN URINE: NEGATIVE
GLUCOSE, UA: NEGATIVE mg/dL
HGB URINE DIPSTICK: NEGATIVE
KETONES UR: NEGATIVE mg/dL
LEUKOCYTES UA: NEGATIVE
NITRITE: NEGATIVE
PH: 5 (ref 5.0–8.0)
Protein, ur: 30 mg/dL — AB
SPECIFIC GRAVITY, URINE: 1.017 (ref 1.005–1.030)

## 2017-03-16 LAB — BASIC METABOLIC PANEL
ANION GAP: 8 (ref 5–15)
BUN: 10 mg/dL (ref 6–20)
CHLORIDE: 106 mmol/L (ref 101–111)
CO2: 23 mmol/L (ref 22–32)
Calcium: 8.6 mg/dL — ABNORMAL LOW (ref 8.9–10.3)
Creatinine, Ser: 0.84 mg/dL (ref 0.44–1.00)
GFR calc non Af Amer: >60 mL/min (ref >60)
GLUCOSE: 142 mg/dL — AB (ref 65–99)
POTASSIUM: 3.4 mmol/L — AB (ref 3.5–5.1)
Sodium: 137 mmol/L (ref 135–145)

## 2017-03-16 LAB — CBC WITH DIFFERENTIAL/PLATELET
BASOS PCT: 0 %
Basophils Absolute: 0 10*3/uL (ref 0.0–0.1)
Eosinophils Absolute: 0.3 10*3/uL (ref 0.0–0.7)
Eosinophils Relative: 3 %
HEMATOCRIT: 34.5 % — AB (ref 36.0–46.0)
HEMOGLOBIN: 11.3 g/dL — AB (ref 12.0–15.0)
LYMPHS ABS: 1.8 10*3/uL (ref 0.7–4.0)
LYMPHS PCT: 21 %
MCH: 29.1 pg (ref 26.0–34.0)
MCHC: 32.8 g/dL (ref 30.0–36.0)
MCV: 88.9 fL (ref 78.0–100.0)
MONO ABS: 0.7 10*3/uL (ref 0.1–1.0)
Monocytes Relative: 9 %
NEUTROS ABS: 5.7 10*3/uL (ref 1.7–7.7)
NEUTROS PCT: 67 %
Platelets: 302 10*3/uL (ref 150–400)
RBC: 3.88 MIL/uL (ref 3.87–5.11)
RDW: 16.4 % — AB (ref 11.5–15.5)
WBC: 8.4 10*3/uL (ref 4.0–10.5)

## 2017-03-16 LAB — GLUCOSE, CAPILLARY
GLUCOSE-CAPILLARY: 107 mg/dL — AB (ref 65–99)
Glucose-Capillary: 155 mg/dL — ABNORMAL HIGH (ref 65–99)

## 2017-03-16 LAB — SEDIMENTATION RATE: SED RATE: 38 mm/h — AB (ref 0–22)

## 2017-03-16 LAB — TROPONIN I

## 2017-03-16 LAB — I-STAT TROPONIN, ED: TROPONIN I, POC: 0 ng/mL (ref 0.00–0.08)

## 2017-03-16 LAB — CREATININE, SERUM
Creatinine, Ser: 0.83 mg/dL (ref 0.44–1.00)
GFR calc Af Amer: >60 mL/min (ref >60)

## 2017-03-16 LAB — HEMOGLOBIN A1C
HEMOGLOBIN A1C: 7.1 % — AB (ref 4.8–5.6)
MEAN PLASMA GLUCOSE: 157.07 mg/dL

## 2017-03-16 LAB — I-STAT CG4 LACTIC ACID, ED: Lactic Acid, Venous: 1.26 mmol/L (ref 0.5–1.9)

## 2017-03-16 LAB — C-REACTIVE PROTEIN: CRP: 9.6 mg/dL — ABNORMAL HIGH (ref <1.0)

## 2017-03-16 LAB — PREALBUMIN: Prealbumin: 12.5 mg/dL — ABNORMAL LOW (ref 18–38)

## 2017-03-16 LAB — CBG MONITORING, ED: Glucose-Capillary: 119 mg/dL — ABNORMAL HIGH (ref 65–99)

## 2017-03-16 MED ORDER — SODIUM CHLORIDE 0.9% FLUSH
3.0000 mL | Freq: Two times a day (BID) | INTRAVENOUS | Status: DC
  Administered 2017-03-16 – 2017-03-18 (×4): 3 mL via INTRAVENOUS

## 2017-03-16 MED ORDER — CYCLOBENZAPRINE HCL 10 MG PO TABS: 10.0000 mg | ORAL_TABLET | Freq: Every evening | ORAL | Status: DC | PRN

## 2017-03-16 MED ORDER — INSULIN ASPART 100 UNIT/ML ~~LOC~~ SOLN
0.0000 [IU] | Freq: Every day | SUBCUTANEOUS | Status: DC
Start: 2017-03-16 — End: 2017-03-18
  Administered 2017-03-16: 0 [IU] via SUBCUTANEOUS

## 2017-03-16 MED ORDER — ASPIRIN 325 MG PO TABS
325.0000 mg | ORAL_TABLET | Freq: Every day | ORAL | Status: DC
  Administered 2017-03-16 – 2017-03-18 (×4): 325 mg via ORAL
  Filled 2017-03-16 (×3): qty 1

## 2017-03-16 MED ORDER — ONDANSETRON HCL 4 MG PO TABS: 4.0000 mg | ORAL_TABLET | Freq: Four times a day (QID) | ORAL | Status: DC | PRN

## 2017-03-16 MED ORDER — INSULIN ASPART 100 UNIT/ML ~~LOC~~ SOLN
0.0000 [IU] | Freq: Three times a day (TID) | SUBCUTANEOUS | Status: DC
  Administered 2017-03-17 – 2017-03-18 (×4): 4 [IU] via SUBCUTANEOUS
  Administered 2017-03-18: 3 [IU] via SUBCUTANEOUS

## 2017-03-16 MED ORDER — ALBUTEROL SULFATE HFA 108 (90 BASE) MCG/ACT IN AERS
2.0000 | INHALATION_SPRAY | RESPIRATORY_TRACT | Status: DC | PRN
  Filled 2017-03-16: qty 6.7

## 2017-03-16 MED ORDER — HYDROMORPHONE HCL 1 MG/ML IJ SOLN
1.0000 mg | INTRAMUSCULAR | Status: DC | PRN
  Administered 2017-03-16: 1 mg via INTRAVENOUS
  Filled 2017-03-16: qty 1

## 2017-03-16 MED ORDER — ACETAMINOPHEN 325 MG PO TABS
650.0000 mg | ORAL_TABLET | Freq: Four times a day (QID) | ORAL | Status: DC | PRN
  Administered 2017-03-17 – 2017-03-18 (×2): 650 mg via ORAL
  Filled 2017-03-16 (×2): qty 2

## 2017-03-16 MED ORDER — NIFEDIPINE ER OSMOTIC RELEASE 30 MG PO TB24
90.0000 mg | ORAL_TABLET | Freq: Every day | ORAL | Status: DC
  Administered 2017-03-16 – 2017-03-18 (×3): 90 mg via ORAL
  Filled 2017-03-16 (×3): qty 1

## 2017-03-16 MED ORDER — INSULIN GLARGINE 100 UNIT/ML ~~LOC~~ SOLN
75.0000 [IU] | Freq: Every day | SUBCUTANEOUS | Status: DC
  Administered 2017-03-17 – 2017-03-18 (×2): 75 [IU] via SUBCUTANEOUS
  Filled 2017-03-16 (×2): qty 0.75

## 2017-03-16 MED ORDER — ATENOLOL 25 MG PO TABS: 25.0000 mg | ORAL_TABLET | Freq: Two times a day (BID) | ORAL | Status: DC

## 2017-03-16 MED ORDER — HYDROMORPHONE HCL-NACL 0.5-0.9 MG/ML-% IV SOSY
1.0000 mg | PREFILLED_SYRINGE | INTRAVENOUS | Status: DC | PRN
  Administered 2017-03-16 – 2017-03-18 (×3): 1 mg via INTRAVENOUS
  Filled 2017-03-16 (×3): qty 2

## 2017-03-16 MED ORDER — PIPERACILLIN-TAZOBACTAM 3.375 G IVPB 30 MIN
3.3750 g | Freq: Once | INTRAVENOUS | Status: AC
  Administered 2017-03-16: 3.375 g via INTRAVENOUS
  Filled 2017-03-16: qty 50

## 2017-03-16 MED ORDER — ALBUTEROL SULFATE (2.5 MG/3ML) 0.083% IN NEBU: 2.5000 mg | INHALATION_SOLUTION | RESPIRATORY_TRACT | Status: DC | PRN

## 2017-03-16 MED ORDER — SODIUM CHLORIDE 0.9 % IV SOLN: 250.0000 mL | INTRAVENOUS | Status: DC | PRN

## 2017-03-16 MED ORDER — ATENOLOL 25 MG PO TABS
25.0000 mg | ORAL_TABLET | Freq: Every day | ORAL | Status: DC
  Administered 2017-03-16: 25 mg via ORAL
  Filled 2017-03-16 (×3): qty 1

## 2017-03-16 MED ORDER — INSULIN DEGLUDEC 100 UNIT/ML ~~LOC~~ SOPN: 75.0000 [IU] | PEN_INJECTOR | SUBCUTANEOUS | Status: DC

## 2017-03-16 MED ORDER — FENTANYL CITRATE (PF) 100 MCG/2ML IJ SOLN
50.0000 ug | Freq: Once | INTRAMUSCULAR | Status: AC
  Administered 2017-03-16: 50 ug via INTRAVENOUS
  Filled 2017-03-16: qty 2

## 2017-03-16 MED ORDER — FUROSEMIDE 20 MG PO TABS
20.0000 mg | ORAL_TABLET | Freq: Every day | ORAL | Status: DC
  Administered 2017-03-16 – 2017-03-18 (×3): 20 mg via ORAL
  Filled 2017-03-16 (×3): qty 1

## 2017-03-16 MED ORDER — ATENOLOL 50 MG PO TABS
50.0000 mg | ORAL_TABLET | Freq: Every morning | ORAL | Status: DC
  Administered 2017-03-16 – 2017-03-18 (×4): 50 mg via ORAL
  Filled 2017-03-16 (×3): qty 1

## 2017-03-16 MED ORDER — ALLOPURINOL 300 MG PO TABS
300.0000 mg | ORAL_TABLET | Freq: Every day | ORAL | Status: DC
  Administered 2017-03-16 – 2017-03-18 (×3): 300 mg via ORAL
  Filled 2017-03-16 (×3): qty 1

## 2017-03-16 MED ORDER — VANCOMYCIN HCL 10 G IV SOLR
1250.0000 mg | Freq: Two times a day (BID) | INTRAVENOUS | Status: DC
  Administered 2017-03-16 – 2017-03-17 (×2): 1250 mg via INTRAVENOUS
  Filled 2017-03-16 (×3): qty 1250

## 2017-03-16 MED ORDER — POTASSIUM CHLORIDE CRYS ER 10 MEQ PO TBCR
10.0000 meq | EXTENDED_RELEASE_TABLET | Freq: Every day | ORAL | Status: DC
  Administered 2017-03-16 – 2017-03-18 (×3): 10 meq via ORAL
  Filled 2017-03-16 (×6): qty 1

## 2017-03-16 MED ORDER — SODIUM CHLORIDE 0.9% FLUSH: 3.0000 mL | INTRAVENOUS | Status: DC | PRN

## 2017-03-16 MED ORDER — ACETAMINOPHEN 650 MG RE SUPP: 650.0000 mg | Freq: Four times a day (QID) | RECTAL | Status: DC | PRN

## 2017-03-16 MED ORDER — ONDANSETRON HCL 4 MG/2ML IJ SOLN
4.0000 mg | Freq: Four times a day (QID) | INTRAMUSCULAR | Status: DC | PRN
  Administered 2017-03-16: 4 mg via INTRAVENOUS
  Filled 2017-03-16: qty 2

## 2017-03-16 MED ORDER — PIPERACILLIN-TAZOBACTAM 3.375 G IVPB
3.3750 g | Freq: Three times a day (TID) | INTRAVENOUS | Status: DC
  Administered 2017-03-16 – 2017-03-17 (×3): 3.375 g via INTRAVENOUS
  Filled 2017-03-16 (×4): qty 50

## 2017-03-16 MED ORDER — VANCOMYCIN HCL IN DEXTROSE 1-5 GM/200ML-% IV SOLN
1000.0000 mg | Freq: Once | INTRAVENOUS | Status: AC
  Administered 2017-03-16: 1000 mg via INTRAVENOUS
  Filled 2017-03-16: qty 200

## 2017-03-16 MED ORDER — GABAPENTIN 300 MG PO CAPS
300.0000 mg | ORAL_CAPSULE | Freq: Two times a day (BID) | ORAL | Status: DC
  Administered 2017-03-16 – 2017-03-18 (×4): 300 mg via ORAL
  Filled 2017-03-16 (×4): qty 1

## 2017-03-16 MED ORDER — ENOXAPARIN SODIUM 80 MG/0.8ML ~~LOC~~ SOLN
80.0000 mg | SUBCUTANEOUS | Status: DC
  Administered 2017-03-16 – 2017-03-17 (×2): 80 mg via SUBCUTANEOUS
  Filled 2017-03-16 (×2): qty 0.8

## 2017-03-16 MED ORDER — ENOXAPARIN SODIUM 40 MG/0.4ML ~~LOC~~ SOLN: 40.0000 mg | SUBCUTANEOUS | Status: DC

## 2017-03-16 NOTE — ED Provider Notes (Signed)
Medical screening examination/treatment/procedure(s) were conducted as a shared visit with non-physician practitioner(s) and myself.  I personally evaluated the patient during the encounter. Briefly, the patient is a diabetic female here with open, draining wounds to bilateral legs, right greater than left. Patient has significant warmth as well as foul-smelling, purulent drainage from her right lower extremity wound. She is not febrile but does have a significant leukocytosis. Lactic acid is normal. No tenderness beyond areas of erythema to suggest necrotizing fasciitis. Will give broad-spectrum antibiotics and plan for admission.Shaune Pollack, MD 03/16/17 [phone]

## 2017-03-16 NOTE — Progress Notes (Signed)
A consult was received from an ED physician for vancomycin per pharmacy dosing.  The patient's profile has been reviewed for ht/wt/allergies/indication/available labs.   A one time order has been placed for vanc 1g.  Further antibiotics/pharmacy consults should be ordered by admitting physician if indicated.                       Thank you,  Hessie Knows, PharmD, BCPS Pager [phone] 03/16/2017 7:45 AM

## 2017-03-16 NOTE — ED Notes (Signed)
Per Laretta Alstrom, [place] hospitalist okayed to do I-stat trop now, instead of sending it down to the lab.

## 2017-03-16 NOTE — ED Provider Notes (Signed)
[facility]-EMERGENCY DEPT Provider Note   CSN: [id number] Arrival date & time: 03/15/17  1909     History   Chief Complaint Chief Complaint  Patient presents with  . Leg Pain    HPI [name] is a 66 y.o. female.  HPI 66 year old African-American female past medical history significant for women's for bilateral lower extremities that she thinks is infected. Patient states that she does have chronic diabetic foot ulcers and is being seen by wound management. Patient states that since last week she has noticed more drainage from bilateral legs left greater than right. Patient also reports warmth, foul-smelling discharge and pain to the bilateral lower extremities worse in the left. Patient does report low-grade temperature at home. She also reports chills. Patient states that she has not been on any antibiotics. Patient stated that she does have a history of cellulitis in her lower extremities in the past that have required admission in the past. Patient denies any associated nausea, emesis, abdominal pain, urinary symptoms, change in bowel habits, cough. Patient is able to ambulate however with pain. She denies any associated chest pain or shortness of breath. Patient has not taken anything for her pain. Nothing makes better or worse.  Pt denies any vision changes, lightheadedness, dizziness, congestion, neck pain, cp, sob, cough, abd pain, n/v/d, urinary symptoms, change in bowel habits, melena, hematochezia.  Past Medical History:  Diagnosis Date  . Anxiety   . Arthritis    "back, arms, legs" (03/24/2016)  . Asthma   . Chronic lower back pain   . Colonic polyp    last colonoscopy done in 2009 with normal results per medical record  . Depressive disorder   . Gastric polyp   . Gout    has taken allopurinol  once daily in past  . Headache   . History of hiatal hernia   . Hypertension   . Migraine    "none in awhile; might have a couple/year" (03/24/2016)  . Mixed  hyperlipidemia    01/2016 Total chol 141, HDL 59, LDL 63, ration 1.1  . Osteoarthritis   . TIA (transient ischemic attack) 11/2014  . Type II diabetes mellitus [facility])     Patient Active Problem List   Diagnosis Date Noted  . Cellulitis 07/05/2016  . Depression with anxiety 05/30/2016  . 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 Generalized weakness 05/30/2016  . Left arm weakness 05/30/2016  . Left Lower extremity pain  04/02/2016  . Lower extremity pain, inferior, left 04/02/2016  . Left leg pain   . Personal history of noncompliance with medical treatment, presenting hazards to health 03/31/2016  . Jaw pain 03/27/2016  . Left leg cellulitis 03/24/2016  . Cellulitis of left lower extremity   . Primary insomnia   . Adjustment disorder   . Morbid obesity (HCC) 01/27/2016  . Wheelchair dependent 01/27/2016  . Gout 01/27/2016  . Asthma 01/27/2016  . Arthritis 01/27/2016  . Uncontrolled diabetes mellitus type 2 with peripheral artery disease (HCC) 01/25/2016  . Cellulitis of left leg 01/11/2016  . Insulin-requiring or dependent type II diabetes mellitus (HCC) 01/11/2016  . Hypertension 01/11/2016    Past Surgical History:  Procedure Laterality Date  . ARTERY BIOPSY Right 08/12/2016   Procedure: BIOPSY TEMPORAL ARTERY;  Surgeon: Sherren Kerns, MD;  Location: [facility] OR;  Service: Vascular;  Laterality: Right;  BIOPSY TEMPORAL ARTERY  . BREAST BIOPSY Left ~ 2015   benign  . CARPAL TUNNEL RELEASE Bilateral   .  DILATION AND CURETTAGE OF UTERUS    . KNEE ARTHROSCOPY Right 2003   in [place]  . LAPAROSCOPIC CHOLECYSTECTOMY    . TUBAL LIGATION    . VAGINAL HYSTERECTOMY  1982    OB History    No data available       Home Medications    Prior to Admission medications   Medication Sig Start Date End Date Taking? Authorizing Provider  acetaminophen (TYLENOL) 500 MG tablet Take 500 mg by mouth daily as needed for moderate pain.    [provider]  albuterol  (PROVENTIL HFA;VENTOLIN HFA) 108 (90 Base) MCG/ACT inhaler Inhale 2 puffs into the lungs every 4 (four) hours as needed for wheezing or shortness of breath.     [provider]  allopurinol (ZYLOPRIM) 300 MG tablet Take 300 mg by mouth daily.    [provider]  aspirin 325 MG tablet Take 325 mg by mouth daily.    [provider]  atenolol (TENORMIN) 25 MG tablet Take 25-50 mg by mouth 2 (two) times daily. Take 50 mg every morning and then 25 mg every evening    [provider]  Cholecalciferol (VITAMIN D3) 2000 units capsule Take 2,000 Units by mouth daily.    [provider]  clonazePAM (KLONOPIN) 0.5 MG tablet Take 1 tablet (0.5 mg total) by mouth daily as needed for anxiety. Patient not taking: Reported on 08/08/2016 06/01/16   Edsel Petrin, DO  cyclobenzaprine (FLEXERIL) 10 MG tablet Take 10 mg by mouth at bedtime as needed for muscle spasms.     [provider]  diclofenac sodium (VOLTAREN) 1 % GEL Apply 4 g topically 4 (four) times daily. Patient taking differently: Apply 4 g topically 4 (four) times daily as needed (pain).  02/25/16   Henson, [name] L, NP-C  furosemide (LASIX) 20 MG tablet Take 1 tablet (20 mg total) by mouth daily. 07/07/16   Leroy Sea, MD  gabapentin (NEURONTIN) 300 MG capsule Take 1 capsule (300 mg total) by mouth 2 (two) times daily. 01/31/16   Henson, [name] L, NP-C  hydrocerin (EUCERIN) CREA Apply 1 application topically daily. 01/15/16   Leroy Sea, MD  HYDROcodone-acetaminophen (NORCO/VICODIN) 5-325 MG tablet Take 1-2 tablets by mouth every 6 (six) hours as needed. 02/26/17   Ward, Layla Maw, DO  insulin degludec (TRESIBA FLEXTOUCH) 100 UNIT/ML SOPN FlexTouch Pen Inject 0.75 mLs (75 Units total) into the skin daily. Patient taking differently: Inject 75 Units into the skin every morning.  03/14/16   Henson, [name] L, NP-C  magnesium gluconate (MAGONATE) 500 MG tablet Take 500 mg by mouth daily.     [provider]  NIFEdipine (PROCARDIA XL/ADALAT-CC) 90 MG 24 hr tablet Take 90 mg by mouth daily.    [provider]  NOVOLOG FLEXPEN 100 UNIT/ML FlexPen Take 17 Units by mouth 2 (two) times daily. 01/30/17   [provider]  polyethylene glycol (MIRALAX / GLYCOLAX) packet Take 17 g by mouth daily as needed for moderate constipation.    [provider]  potassium chloride (K-DUR) 10 MEQ tablet Take 1 tablet (10 mEq total) by mouth daily. 07/07/16   Leroy Sea, MD    Family History Family History  Problem Relation Age of Onset  . Stroke Father   . Stroke Brother   . Cancer Other     Social History Social History  Substance Use Topics  . Smoking status: Never Smoker  . Smokeless tobacco: Never Used  . Alcohol use  No     Allergies   Ace inhibitors; Metformin and related; and Propofol   Review of Systems Review of Systems  Constitutional: Positive for chills and fever.  HENT: Negative for congestion.   Eyes: Negative for visual disturbance.  Respiratory: Negative for cough and shortness of breath.   Cardiovascular: Negative for chest pain.  Gastrointestinal: Negative for abdominal pain, diarrhea, nausea and vomiting.  Genitourinary: Negative for dysuria, flank pain, frequency, hematuria and urgency.  Musculoskeletal: Negative for arthralgias and myalgias.  Skin: Positive for color change and wound. Negative for rash.  Neurological: Negative for dizziness, syncope, weakness, light-headedness, numbness and headaches.  Psychiatric/Behavioral: Negative for sleep disturbance. The patient is not nervous/anxious.      Physical Exam Updated Vital Signs BP 128/72 (BP Location: Right Arm)   Pulse 83   Temp 99.4 F (37.4 C) (Oral)   Resp 18   SpO2 97%   Physical Exam  Constitutional: She is oriented to person, place, and time. She appears well-developed and well-nourished.  Non-toxic appearance. No distress.  HENT:  Head:  Normocephalic and atraumatic.  Nose: Nose normal.  Mouth/Throat: Oropharynx is clear and moist.  Eyes: Pupils are equal, round, and reactive to light. Conjunctivae are normal. Right eye exhibits no discharge. Left eye exhibits no discharge.  Neck: Normal range of motion. Neck supple.  Cardiovascular: Normal rate, regular rhythm, normal heart sounds and intact distal pulses.   Pulmonary/Chest: Effort normal and breath sounds normal. No respiratory distress. She exhibits no tenderness.  Abdominal: Soft. Bowel sounds are normal. There is no tenderness. There is no rebound and no guarding.  Musculoskeletal: Normal range of motion. She exhibits no tenderness.  Lymphadenopathy:    She has no cervical adenopathy.  Neurological: She is alert and oriented to person, place, and time.  Skin: Skin is warm and dry. Capillary refill takes less than 2 seconds.  The patient has bilateral lower show any wounds that are draining foul-smelling purulent discharge tender to palpation. Warm to touch. Erythema noted. No crepitus appreciated. Redness and warmth does not extend to the bilateral knees. Full range of motion of all joints. Lichenified skin to bilat lower extremities.   Psychiatric: Her behavior is normal. Judgment and thought content normal.  Nursing note and vitals reviewed.    ED Treatments / Results  Labs (all labs ordered are listed, but only abnormal results are displayed) Labs Reviewed  COMPREHENSIVE METABOLIC PANEL - Abnormal; Notable for the following:       Result Value   Total Protein 9.5 (*)    Total Bilirubin 0.1 (*)    All other components within normal limits  CBC WITH DIFFERENTIAL/PLATELET - Abnormal; Notable for the following:    WBC 14.6 (*)    RDW 16.3 (*)    Neutro Abs 12.1 (*)    All other components within normal limits  CULTURE, BLOOD (ROUTINE X 2)  CULTURE, BLOOD (ROUTINE X 2)  URINALYSIS, ROUTINE W REFLEX MICROSCOPIC  SEDIMENTATION RATE  C-REACTIVE PROTEIN  I-STAT  CG4 LACTIC ACID, ED  I-STAT CG4 LACTIC ACID, ED  CBG MONITORING, ED    EKG  EKG Interpretation None       Radiology Dg Tibia/fibula Left  Result Date: 03/16/2017 CLINICAL DATA:  Open wounds.  Diabetes mellitus EXAM: LEFT TIBIA AND FIBULA - 2 VIEW COMPARISON:  None. FINDINGS: Frontal and lateral views were obtained. There is generalized soft tissue swelling. There is evidence of soft tissue irregularity anterior to the distal tibial diaphysis, likely soft tissue  wound. There is no air within the soft tissues in this area. There are calcifications throughout the soft tissues, likely phleboliths with venous insufficiency. There is no evident fracture or dislocation. No abnormal periosteal reaction. No erosive change or bony destruction. There is osteoarthritic change in the knee joint medially. IMPRESSION: Generalized soft tissue swelling. Soft tissue irregularity anterior the distal tibia, likely soft tissue ulceration. No bony destruction or erosion. No fracture or dislocation. Osteoarthritic change noted in the knee joint. Multiple vascular calcifications appear venous and most likely represent chronic venous insufficiency/stasis. Electronically Signed   By: Bretta Bang III M.D.   On: 03/16/2017 08:49   Dg Tibia/fibula Right  Result Date: 03/16/2017 CLINICAL DATA:  Open wounds.  Diabetes mellitus EXAM: RIGHT TIBIA AND FIBULA - 2 VIEW COMPARISON:  None. FINDINGS: Frontal and lateral views were obtained. There is generalized soft tissue swelling. Multiple soft tissue calcifications are felt to represent venous stasis/ phleboliths. No fracture or dislocation. No erosive change or bony destruction. No abnormal periosteal reaction. There is extensive arthropathy in the right knee joint. IMPRESSION: No erosive change or bony destruction. No fracture or dislocation. 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 Probable chronic venous stasis with multiple vascular  calcifications/phleboliths. Electronically Signed   By: Bretta Bang III M.D.   On: 03/16/2017 08:50    Procedures Procedures (including critical care time)  Medications Ordered in ED Medications  vancomycin (VANCOCIN) IVPB 1000 mg/200 mL premix (1,000 mg Intravenous New Bag/Given 03/16/17 0846)  piperacillin-tazobactam (ZOSYN) IVPB 3.375 g (0 g Intravenous Stopped 03/16/17 0846)  fentaNYL (SUBLIMAZE) injection 50 mcg (50 mcg Intravenous Given 03/16/17 0846)     Initial Impression / Assessment and Plan / ED Course  I have reviewed the triage vital signs and the nursing notes.  Pertinent labs & imaging results that were available during my care of the patient were reviewed by me and considered in my medical decision making (see chart for details).     Patient presents to the ED with complaints of worsening diabetic ulcers to lower extremities with foul-smelling purulent discharge. Low-grade fevers at home. Patient with leukocytosis in the ED of 14,000. Lactic acid is normal. Patient is followed by wound clinic however she has not been on any antibiotics. History of cellulitis has required hospitalization and IV antibiotics in the past. Appears to be cellulitic in nature. We'll start patient on vanc and Zosyn. Blood cultures ordered prior to antibiotic initiation. Kidney function and electrolytes are within normal limits. C-reactive protein, sedimentation rate and blood cultures are pending at this time. X-rays were obtained that showed no bony involvement. Low suspicion for DVT. Pain medicine given. Vital signs remained reassuring. Patient does not meet any SIRS or SEPSIS criteria.   Spoke with Dr. Sharl Ma with hospital medicine for admission. He agrees for admission and we'll place admission orders. Patient remains hemodynamically stable this time. Up-to-date on plan of care.  Patient seen and evaluated my attending Dr. Erma Heritage who is agreeable to the above plan.   Final Clinical  Impressions(s) / ED Diagnoses   Final diagnoses:  Cellulitis of lower extremity, unspecified laterality  Diabetic foot ulcer associated with type 2 diabetes mellitus, unspecified laterality, unspecified part of foot, unspecified ulcer stage [facility])    New Prescriptions New Prescriptions   No medications on file     Wallace Keller 03/16/17 2130    Shaune Pollack, MD 03/17/17 1825

## 2017-03-16 NOTE — H&P (Signed)
TRH H&P    Patient Demographics:    [name], is a 66 y.o. female  MRN: [id number]  DOB - [birth year]  Admit Date - 03/16/2017    Outpatient Primary MD for the patient is Tracey Harries, MD  Patient coming from: Home  Chief Complaint  Patient presents with  . Leg Pain      HPI:    [name]  is a 66 y.o. female, With history of chronic back pain, wheelchair-bound, asthma, hypertension, type 2 diabetes mellitus, hyperlipidemia, depression, gout came to hospital with pain in the lower extremities. Patient has chronic wounds on the lower extremities and is seen by wound care as outpatient. Patient noticed drainage from both legs, left more than right. She also started feeling warm, fall spell and discharge so she came to hospital for further evaluation. Patient also has been coming off chills. Low-grade fever.  She denies nausea, vomiting or diarrhea. Complains of chest pain 2 days ago which lasted for a few minutes. No previous history of CAD. She: Doesn't complain of chest pain at this time. Denies shortness of breath. No dysuria. She had an bilateral ABI done in May 2018 which did not show significant PAD.  In the ED patient was started on vancomycin and Zosyn per pharmacy consultation. X-ray of the lower extremities showed no bony involvement. C-reactive protein, sedimentation rate and blood cultures obtained in the ED.   Review of systems:    In addition to the HPI above,    All other systems reviewed and are negative.   With Past History of the following :    Past Medical History:  Diagnosis Date  . Anxiety   . Arthritis    "back, arms, legs" (03/24/2016)  . Asthma   . Chronic lower back pain   . Colonic polyp    last colonoscopy done in 2009 with normal results per medical record  . Depressive disorder   . Gastric polyp   . Gout    has taken allopurinol  once daily in  past  . Headache   . History of hiatal hernia   . Hypertension   . Migraine    "none in awhile; might have a couple/year" (03/24/2016)  . Mixed hyperlipidemia    01/2016 Total chol 141, HDL 59, LDL 63, ration 1.1  . Osteoarthritis   . TIA (transient ischemic attack) 11/2014  . Type II diabetes mellitus (HCC)       Past Surgical History:  Procedure Laterality Date  . ARTERY BIOPSY Right 08/12/2016   Procedure: BIOPSY TEMPORAL ARTERY;  Surgeon: Sherren Kerns, MD;  Location: [facility] OR;  Service: Vascular;  Laterality: Right;  BIOPSY TEMPORAL ARTERY  . BREAST BIOPSY Left ~ 2015   benign  . CARPAL TUNNEL RELEASE Bilateral   . DILATION AND CURETTAGE OF UTERUS    . KNEE ARTHROSCOPY Right 2003   in [place]  . LAPAROSCOPIC CHOLECYSTECTOMY    . TUBAL LIGATION    . VAGINAL HYSTERECTOMY  1982      Social History:  Social History  Substance Use Topics  . Smoking status: Never Smoker  . Smokeless tobacco: Never Used  . Alcohol use No       Family History :     Family History  Problem Relation Age of Onset  . Stroke Father   . Stroke Brother   . Cancer Other       Home Medications:   Prior to Admission medications   Medication Sig Start Date End Date Taking? Authorizing Provider  acetaminophen (TYLENOL) 500 MG tablet Take 500 mg by mouth daily as needed for moderate pain.    [provider]  albuterol (PROVENTIL HFA;VENTOLIN HFA) 108 (90 Base) MCG/ACT inhaler Inhale 2 puffs into the lungs every 4 (four) hours as needed for wheezing or shortness of breath.     [provider]  allopurinol (ZYLOPRIM) 300 MG tablet Take 300 mg by mouth daily.    [provider]  aspirin 325 MG tablet Take 325 mg by mouth daily.    [provider]  atenolol (TENORMIN) 25 MG tablet Take 25-50 mg by mouth 2 (two) times daily. Take 50 mg every morning and then 25 mg every evening    [provider]  Cholecalciferol (VITAMIN D3) 2000 units capsule Take  2,000 Units by mouth daily.    [provider]  clonazePAM (KLONOPIN) 0.5 MG tablet Take 1 tablet (0.5 mg total) by mouth daily as needed for anxiety. Patient not taking: Reported on 08/08/2016 06/01/16   Edsel Petrin, DO  cyclobenzaprine (FLEXERIL) 10 MG tablet Take 10 mg by mouth at bedtime as needed for muscle spasms.     [provider]  diclofenac sodium (VOLTAREN) 1 % GEL Apply 4 g topically 4 (four) times daily. Patient taking differently: Apply 4 g topically 4 (four) times daily as needed (pain).  02/25/16   Henson, [name] L, NP-C  furosemide (LASIX) 20 MG tablet Take 1 tablet (20 mg total) by mouth daily. 07/07/16   Leroy Sea, MD  gabapentin (NEURONTIN) 300 MG capsule Take 1 capsule (300 mg total) by mouth 2 (two) times daily. 01/31/16   Henson, [name] L, NP-C  hydrocerin (EUCERIN) CREA Apply 1 application topically daily. 01/15/16   Leroy Sea, MD  HYDROcodone-acetaminophen (NORCO/VICODIN) 5-325 MG tablet Take 1-2 tablets by mouth every 6 (six) hours as needed. 02/26/17   Ward, Layla Maw, DO  insulin degludec (TRESIBA FLEXTOUCH) 100 UNIT/ML SOPN FlexTouch Pen Inject 0.75 mLs (75 Units total) into the skin daily. Patient taking differently: Inject 75 Units into the skin every morning.  03/14/16   Henson, [name] L, NP-C  magnesium gluconate (MAGONATE) 500 MG tablet Take 500 mg by mouth daily.    [provider]  NIFEdipine (PROCARDIA XL/ADALAT-CC) 90 MG 24 hr tablet Take 90 mg by mouth daily.    [provider]  NOVOLOG FLEXPEN 100 UNIT/ML FlexPen Take 17 Units by mouth 2 (two) times daily. 01/30/17   [provider]  polyethylene glycol (MIRALAX / GLYCOLAX) packet Take 17 g by mouth daily as needed for moderate constipation.    [provider]  potassium chloride (K-DUR) 10 MEQ tablet Take 1 tablet (10 mEq total) by mouth daily. 07/07/16   Leroy Sea, MD     Allergies:     Allergies  Allergen Reactions  . Ace  Inhibitors Swelling    SWELLING REACTION UNSPECIFIED   . Metformin And Related Other (See Comments)    CHILLS  . Propofol Itching  Physical Exam:   Vitals  Blood pressure 128/72, pulse 83, temperature 99.4 F (37.4 C), temperature source Oral, resp. rate 18, SpO2 97 %.  1.  General: Morbidly obese female in no acute distress  2. Psychiatric:  Intact judgement and  insight, awake alert, oriented x 3.  3. Neurologic: No focal neurological deficits, all cranial nerves intact.Strength 5/5 all 4 extremities, sensation intact all 4 extremities, plantars down going.  4. Eyes :  anicteric sclerae, moist conjunctivae with no lid lag. PERRLA.  5. ENMT:  Oropharynx clear with moist mucous membranes and good dentition  6. Neck:  supple, no cervical lymphadenopathy appriciated, No thyromegaly  7. Respiratory : Normal respiratory effort, good air movement bilaterally,clear to  auscultation bilaterally  8. Cardiovascular : RRR, no gallops, rubs or murmurs, no leg edema  9. Gastrointestinal:  Positive bowel sounds, abdomen soft, non-tender to palpation,no hepatosplenomegaly, no rigidity or guarding       10. Skin:  Skin of both lower extremities warm to touch, multiple ulcers noted in the lower extremities no clear discharge noted. Left lower extremity has ulcer at the anterior surface.       Data Review:    CBC  Recent Labs Lab 03/15/17 2049  WBC 14.6*  HGB 13.5  HCT 40.7  PLT 331  MCV 89.1  MCH 29.5  MCHC 33.2  RDW 16.3*  LYMPHSABS 1.5  MONOABS 0.8  EOSABS 0.2  BASOSABS 0.0   ------------------------------------------------------------------------------------------------------------------  Chemistries   Recent Labs Lab 03/15/17 2049  NA 140  K 3.6  CL 106  CO2 26  GLUCOSE 82  BUN 10  CREATININE 0.82  CALCIUM 9.4  AST 16  ALT 14  ALKPHOS 116  BILITOT 0.1*    ------------------------------------------------------------------------------------------------------------------  ------------------------------------------------------------------------------------------------------------------ GFR: CrCl cannot be calculated (Unknown ideal weight.). Liver Function Tests:  Recent Labs Lab 03/15/17 2049  AST 16  ALT 14  ALKPHOS 116  BILITOT 0.1*  PROT 9.5*  ALBUMIN 3.5   CBG:  Recent Labs Lab 03/15/17 2222  GLUCAP 81    --------------------------------------------------------------------------------------------------------------- Urine analysis:    Component Value Date/Time   COLORURINE YELLOW 02/25/2017 1236   APPEARANCEUR CLEAR 02/25/2017 1236   LABSPEC 1.011 02/25/2017 1236   PHURINE 6.0 02/25/2017 1236   GLUCOSEU NEGATIVE 02/25/2017 1236   HGBUR NEGATIVE 02/25/2017 1236   BILIRUBINUR NEGATIVE 02/25/2017 1236   KETONESUR NEGATIVE 02/25/2017 1236   PROTEINUR NEGATIVE 02/25/2017 1236   NITRITE NEGATIVE 02/25/2017 1236   LEUKOCYTESUR NEGATIVE 02/25/2017 1236      Imaging Results:    Dg Tibia/fibula Left  Result Date: 03/16/2017 CLINICAL DATA:  Open wounds.  Diabetes mellitus EXAM: LEFT TIBIA AND FIBULA - 2 VIEW COMPARISON:  None. FINDINGS: Frontal and lateral views were obtained. There is generalized soft tissue swelling. There is evidence of soft tissue irregularity anterior to the distal tibial diaphysis, likely soft tissue wound. There is no air within the soft tissues in this area. There are calcifications throughout the soft tissues, likely phleboliths with venous insufficiency. There is no evident fracture or dislocation. No abnormal periosteal reaction. No erosive change or bony destruction. There is osteoarthritic change in the knee joint medially. IMPRESSION: Generalized soft tissue swelling. Soft tissue irregularity anterior the distal tibia, likely soft tissue ulceration. No bony destruction or erosion. No fracture or  dislocation. Osteoarthritic change noted in the knee joint. Multiple vascular calcifications appear venous and most likely represent chronic venous insufficiency/stasis. Electronically Signed   By: Bretta Bang III M.D.   On: 03/16/2017  08:49   Dg Tibia/fibula Right  Result Date: 03/16/2017 CLINICAL DATA:  Open wounds.  Diabetes mellitus EXAM: RIGHT TIBIA AND FIBULA - 2 VIEW COMPARISON:  None. FINDINGS: Frontal and lateral views were obtained. There is generalized soft tissue swelling. Multiple soft tissue calcifications are felt to represent venous stasis/ phleboliths. No fracture or dislocation. No erosive change or bony destruction. No abnormal periosteal reaction. There is extensive arthropathy in the right knee joint. IMPRESSION: No erosive change or bony destruction. No fracture or dislocation. 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 Probable chronic venous stasis with multiple vascular calcifications/phleboliths. Electronically Signed   By: Bretta Bang III M.D.   On: 03/16/2017 08:50    My personal review of EKG: Rhythm NSR   Assessment & Plan:    Active Problems:   Insulin-requiring or dependent type II diabetes mellitus (HCC)   Morbid obesity (HCC)   Wheelchair dependent   Asthma   Left leg cellulitis   Cellulitis   1. Left lower extremity cellulitis/infected wounds- patient's WBC 14,000, lactic acid 1.26. She has been started on vancomycin and Zosyn in the ED. X-ray of the lower extremity showed no bony involvement. Follow C-reactive protein, sedimentation rate and blood cultures. Will obtain wound care consultation. Patient likely can be switched to oral antibiotics in next 24-48 hours if blood cultures remain negative. Recent ABI in May 2018 showed no significant bilateral PAD.  2. Diabetes mellitus, type 2- continue [place]. Will start resistant sliding scale insulin with NovoLog. Check hemoglobin A1c in a.m.  3. Hypertension-blood  pressure is controlled, continue nifedipine, atenolol  4. History of gout-stable, continue allopurinol  5. History of asthma-no acute exacerbation, continue albuterol inhaler when necessary  6. Recent history of chest pain-2 days ago, resolved. No chest pain at this time. Will check troponin every 6 hours 3, she does have risk factors for CAD.    DVT Prophylaxis-   Lovenox   AM Labs Ordered, also please review Full Orders  Family Communication: Admission, patients condition and plan of care including tests being ordered have been discussed with the patient  who indicate understanding and agree with the plan and Code Status.  Code Status:  Full code  Admission status: Observation    Time spent in minutes : 60 minutes   LAMA,GAGAN S M.D on 03/16/2017 at 9:57 AM  Between 7am to 7pm - Pager - [phone]. After 7pm go to [email or web address] - password [facility]  [facility] - Office  [phone]

## 2017-03-16 NOTE — ED Notes (Signed)
Pt reports she has a "red spot but it's not open" on her bottom.  Pt is sitting on the side of the stretcher.  Reports she gets around with a motorized WC and transfers herself from it to the bed.

## 2017-03-16 NOTE — Progress Notes (Signed)
Patient declines blood products

## 2017-03-16 NOTE — ED Notes (Signed)
PA at bedside.

## 2017-03-16 NOTE — ED Notes (Signed)
Bed: WA05 Expected date:  Expected time:  Means of arrival:  Comments: 

## 2017-03-16 NOTE — ED Notes (Signed)
Family at bedside. 

## 2017-03-16 NOTE — Progress Notes (Signed)
Pharmacy Antibiotic Note  [name] is a 66 y.o. female admitted on 03/16/2017 with cellulitis.  Pharmacy has been consulted for vanc/Zosyn dosing.  Plan: Vancomycin  IV every 12 hours.  Goal trough 10-15 mcg/mL. Zosyn 3.375g IV q8h (4 hour infusion).  Daily SCr  Weight: (!) 320 lb (145.2 kg) (from previous weight in september)  Temp (24hrs), Avg:99 F (37.2 C), Min:98.7 F (37.1 C), Max:99.4 F (37.4 C)   Recent Labs Lab 03/15/17 2049 03/15/17 2107 03/16/17 0807  WBC 14.6*  --   --   CREATININE 0.82  --   --   LATICACIDVEN  --  1.13 1.26    Estimated Creatinine Clearance: 97 mL/min (by C-G formula based on SCr of 0.82 mg/dL).    Allergies  Allergen Reactions  . Ace Inhibitors Swelling    SWELLING REACTION UNSPECIFIED   . Metformin And Related Other (See Comments)    CHILLS  . Propofol Itching     Thank you for allowing pharmacy to be a part of this patient's care.   Hessie Knows, PharmD, BCPS Pager [phone] 03/16/2017 12:39 PM

## 2017-03-16 NOTE — ED Notes (Signed)
Pt states she needed to be disconnected because his son is picking her up, she is leaving.  She's made aware that she is going to be admitted to the hospital per EDP and admitting MD [place].  She states Dr. Sharl Ma told her that she is going to be discharged because the infection did not reach her bones.

## 2017-03-16 NOTE — ED Notes (Signed)
Made pt aware that she is admitted to the hospital.  Pt agreed to stay.  Family at bedside.  Pt reports bila LE pain and h/a.  Pt is A&Ox 4.

## 2017-03-16 NOTE — ED Notes (Signed)
Bed: WA04 Expected date:  Expected time:  Means of arrival:  Comments: 

## 2017-03-17 DIAGNOSIS — L03119 Cellulitis of unspecified part of limb: Secondary | ICD-10-CM | POA: Diagnosis not present

## 2017-03-17 LAB — GLUCOSE, CAPILLARY
GLUCOSE-CAPILLARY: 172 mg/dL — AB (ref 65–99)
GLUCOSE-CAPILLARY: 141 mg/dL — AB (ref 65–99)
Glucose-Capillary: 186 mg/dL — ABNORMAL HIGH (ref 65–99)
Glucose-Capillary: 160 mg/dL — ABNORMAL HIGH (ref 65–99)

## 2017-03-17 LAB — COMPREHENSIVE METABOLIC PANEL
ALT: 13 U/L — AB (ref 14–54)
AST: 16 U/L (ref 15–41)
Albumin: 2.9 g/dL — ABNORMAL LOW (ref 3.5–5.0)
Alkaline Phosphatase: 101 U/L (ref 38–126)
Anion gap: 7 (ref 5–15)
BUN: 9 mg/dL (ref 6–20)
CHLORIDE: 106 mmol/L (ref 101–111)
CO2: 24 mmol/L (ref 22–32)
CREATININE: 0.87 mg/dL (ref 0.44–1.00)
Calcium: 8.6 mg/dL — ABNORMAL LOW (ref 8.9–10.3)
GFR calc non Af Amer: >60 mL/min (ref >60)
Glucose, Bld: 175 mg/dL — ABNORMAL HIGH (ref 65–99)
Potassium: 3.7 mmol/L (ref 3.5–5.1)
SODIUM: 137 mmol/L (ref 135–145)
Total Bilirubin: 0.5 mg/dL (ref 0.3–1.2)
Total Protein: 7.7 g/dL (ref 6.5–8.1)

## 2017-03-17 LAB — CBC
HCT: 34 % — ABNORMAL LOW (ref 36.0–46.0)
Hemoglobin: 11 g/dL — ABNORMAL LOW (ref 12.0–15.0)
MCH: 28.3 pg (ref 26.0–34.0)
MCHC: 32.4 g/dL (ref 30.0–36.0)
MCV: 87.4 fL (ref 78.0–100.0)
PLATELETS: 282 10*3/uL (ref 150–400)
RBC: 3.89 MIL/uL (ref 3.87–5.11)
RDW: 16.4 % — ABNORMAL HIGH (ref 11.5–15.5)
WBC: 8.4 10*3/uL (ref 4.0–10.5)

## 2017-03-17 LAB — HIV ANTIBODY (ROUTINE TESTING W REFLEX): HIV SCREEN 4TH GENERATION: NONREACTIVE

## 2017-03-17 LAB — TROPONIN I

## 2017-03-17 MED ORDER — METRONIDAZOLE 500 MG PO TABS
500.0000 mg | ORAL_TABLET | Freq: Three times a day (TID) | ORAL | Status: DC
  Administered 2017-03-17 – 2017-03-18 (×4): 500 mg via ORAL
  Filled 2017-03-17 (×4): qty 1

## 2017-03-17 MED ORDER — DEXTROSE 5 % IV SOLN
2.0000 g | INTRAVENOUS | Status: DC
  Administered 2017-03-17 – 2017-03-18 (×2): 2 g via INTRAVENOUS
  Filled 2017-03-17 (×2): qty 2

## 2017-03-17 MED ORDER — PREMIER PROTEIN SHAKE
11.0000 [oz_av] | ORAL | Status: DC
  Administered 2017-03-17: 11 [oz_av] via ORAL
  Filled 2017-03-17 (×3): qty 325.31

## 2017-03-17 NOTE — Progress Notes (Signed)
Initial Nutrition Assessment  DOCUMENTATION CODES:   Morbid obesity  INTERVENTION:   Provide Premier Protein daily, each supplement provides 160 kcal and 30 grams of protein.  RD will continue to monitor for needs  NUTRITION DIAGNOSIS:   Increased nutrient needs related to wound healing as evidenced by estimated needs.  GOAL:   Patient will meet greater than or equal to 90% of their needs  MONITOR:   PO intake, Supplement acceptance, Labs, Weight trends, Skin, I & O's  REASON FOR ASSESSMENT:   Consult Wound healing  ASSESSMENT:   66 y.o. female, With history of chronic back pain, wheelchair-bound, asthma, hypertension, type 2 diabetes mellitus, hyperlipidemia, depression, gout came to hospital with pain in the lower extremities. Patient has chronic wounds on the lower extremities and is seen by wound care as outpatient. Patient noticed drainage from both legs, left more than right. She also started feeling warm, fall spell and discharge so she came to hospital for further evaluation. Patient also has been coming off chills. Low-grade fever.  Patient consuming 100% of meals at this time. Dinner on 10/1 provided 341 kcal and 20g protein and breakfast this morning provided 337 kcal and 13g protein. RD will provide Premier Protein to help patient meets protein needs.  Per chart review, pt's weight has fluctuated. Nutrition focused physical exam shows no sign of depletion of muscle mass or body fat.  Medications: Lasix tablet daily, K-DUR tablet daily, IV Zofran PRN Labs reviewed: CBGs: 172-186  Diet Order:  Diet Carb Modified Fluid consistency: Thin; Room service appropriate? Yes  Skin:  Wound (see comment) (venous stasis ulcer)  Last BM:  9/30  Height:   Ht Readings from Last 1 Encounters:  03/16/17  (1.626 m)    Weight:   Wt Readings from Last 1 Encounters:  03/16/17 (!) 338 lb 3 oz (153.4 kg)    Ideal Body Weight:  54.5 kg  BMI:  Body mass index is  58.05 kg/m.  Estimated Nutritional Needs:   Kcal:  1900-2100  Protein:  110-120g  Fluid:  2L/day  EDUCATION NEEDS:   No education needs identified at this time  Tilda Franco, MS, RD, LDN Pager: [phone] After Hours Pager: [phone]

## 2017-03-17 NOTE — Progress Notes (Signed)
[facility] Progress Note  Patient: [name] ZOX:[id number]   PCP: Tracey Harries, MD DOB: [birth year]   DOA: 03/16/2017   DOS: 03/17/2017   Date of Service: the patient was seen and examined on 03/17/2017  Subjective: feeling better, no fever no chills. Leg pain is better. No significant drainage from the leg.  Brief hospital course: Pt. with PMH of chronic back pain, chronic wheelchair, asthma, hypertension, type II DM, depression, chronic venous stasis with edema and ulcer; admitted on 03/16/2017, presented with complaint of foul-smelling from the leg as well as leg pain, was found to have wound infection. Currently further plan is continue antibiotics.  Assessment and Plan: 1. Left lower extremity cellulitis/infected wounds- patient's WBC 14,000, lactic acid 1.26. She has been started on vancomycin and Zosyn in the ED. X-ray of the lower extremity showed no bony involvement.mild elevated CRP. Wound care consult appreciated. We'll change antibiotics to ceftriaxone and Flagyl. Patient likely can be switched to oral antibiotics in next 24-48 hours if blood cultures remain negative. Recent ABI in May 2018 showed no significant bilateral PAD.  2. Diabetes mellitus, type 2- A1c appears well controlled. Continue sliding scale insulin as well as Lantus.  3. Hypertension-blood pressure is controlled, continue nifedipine, atenolol  4. History of gout-stable, continue allopurinol  5. History of asthma-no acute exacerbation, continue albuterol inhaler when necessary  6. Recent history of chest pain-2 days ago, resolved. No chest pain at this time. Will check troponin every 6 hours 3, she does have risk factors for CAD.  Diet: cardiac and carb modified diet DVT Prophylaxis: subcutaneous Heparin  Advance goals of care discussion: full code  Family Communication: no family was present at bedside, at the time of interview.  Disposition:  Discharge to be determined. Depending on  physical therapy consult.  Consultants: none Procedures: none  Antibiotics: Anti-infectives    Start     Dose/Rate Route Frequency Ordered Stop   03/17/17 1430  cefTRIAXone (ROCEPHIN) 2 g in dextrose 5 % 50 mL IVPB     2 g 100 mL/hr over 30 Minutes Intravenous Every 24 hours 03/17/17 1347     03/17/17 1430  metroNIDAZOLE (FLAGYL) tablet 500 mg     500 mg Oral Every 8 hours 03/17/17 1347     03/16/17 1800  vancomycin (VANCOCIN) 1,250 mg in sodium chloride 0.9 % 250 mL IVPB  Status:  Discontinued     1,250 mg 166.7 mL/hr over 90 Minutes Intravenous Every 12 hours 03/16/17 1241 03/17/17 1343   03/16/17 1800  piperacillin-tazobactam (ZOSYN) IVPB 3.375 g  Status:  Discontinued     3.375 g 12.5 mL/hr over 240 Minutes Intravenous Every 8 hours 03/16/17 1241 03/17/17 1343   03/16/17 0800  vancomycin (VANCOCIN) IVPB 1000 mg/200 mL premix     1,000 mg 200 mL/hr over 60 Minutes Intravenous  Once 03/16/17 0745 03/16/17 1231   03/16/17 0745  piperacillin-tazobactam (ZOSYN) IVPB 3.375 g     3.375 g 100 mL/hr over 30 Minutes Intravenous  Once 03/16/17 0732 03/16/17 0846       Objective: Physical Exam: Vitals:   03/16/17 1658 03/16/17 2028 03/17/17 0519 03/17/17 1344  BP: 111/68 113/61 106/71 109/71  Pulse: 88 96 81 71  Resp: Temp: 98 F (36.7 C) 99.2 F (37.3 C) 98.6 F (37 C) 98.3 F (36.8 C)  TempSrc: Oral Oral Oral Oral  SpO2: 98% 92% 92% 99%  Weight: (!) 153.4 kg (338 lb 3 oz)  Height:  (1.626 m)       Intake/Output Summary (Last 24 hours) at 03/17/17 1810 Last data filed at 03/17/17 1344  Gross per 24 hour  Intake             1440 ml  Output             1400 ml  Net               40 ml   Filed Weights   03/16/17 1200 03/16/17 1658  Weight: (!) 145.2 kg (320 lb) (!) 153.4 kg (338 lb 3 oz)   General: Alert, Awake and Oriented to Time, Place and Person. Appear in mild distress, affect appropriateed Eyes: PERRL, Conjunctiva normal ENT: Oral Mucosa  clear moist. Neck: difficult to assess JVD, no Abnormal Mass Or lumps Cardiovascular: S1 and S2 Present, no Murmur, Peripheral Pulses Present Respiratory: normal respiratory effort, Bilateral Air entry equal and Decreased, no use of accessory muscle, basal Crackles, no wheezes Abdomen: Bowel Sound present, Soft and no tenderness, no hernia Skin: bilateral chronic venous stasis changes Extremities: bilateral Pedal edema, no calf tenderness Neurologic: Grossly no focal neuro deficit. Bilaterally Equal motor strength  Data Reviewed: CBC:  Recent Labs Lab 03/15/17 2049 03/16/17 1558 03/16/17 2153 03/17/17 0420  WBC 14.6* 10.5 8.4 8.4  NEUTROABS 12.1*  --  5.7  --   HGB 13.5 11.9* 11.3* 11.0*  HCT 40.7 36.7 34.5* 34.0*  MCV 89.1 89.1 88.9 87.4  PLT 331 291 302 282   Basic Metabolic Panel:  Recent Labs Lab 03/15/17 2049 03/16/17 1558 03/16/17 2153 03/17/17 0420  NA 140  --  137 137  K 3.6  --  3.4* 3.7  CL 106  --  106 106  CO2 26  --  23 24  GLUCOSE 82  --  142* 175*  BUN 10  --  10 9  CREATININE 0.82 0.83 0.84 0.87  CALCIUM 9.4  --  8.6* 8.6*    Liver Function Tests:  Recent Labs Lab 03/15/17 2049 03/17/17 0420  AST 16 16  ALT 14 13*  ALKPHOS 116 101  BILITOT 0.1* 0.5  PROT 9.5* 7.7  ALBUMIN 3.5 2.9*   No results for input(s): LIPASE, AMYLASE in the last 168 hours. No results for input(s): AMMONIA in the last 168 hours. Coagulation Profile: No results for input(s): INR, PROTIME in the last 168 hours. Cardiac Enzymes:  Recent Labs Lab 03/16/17 1558 03/16/17 2153 03/17/17 0040  TROPONINI <0.03 <0.03 <0.03   BNP (last 3 results) No results for input(s): PROBNP in the last 8760 hours. CBG:  Recent Labs Lab 03/16/17 1653 03/16/17 2151 03/17/17 0752 03/17/17 1221 03/17/17 1709  GLUCAP 107* 155* 172* 186* 160*   Studies: No results found.  Scheduled Meds: . allopurinol  300 mg Oral Daily  . aspirin  325 mg Oral Daily  . atenolol  25 mg Oral  QHS  . atenolol  50 mg Oral q morning - 10a  . enoxaparin (LOVENOX) injection  80 mg Subcutaneous Q24H  . furosemide  20 mg Oral Daily  . gabapentin  300 mg Oral BID  . insulin aspart  0-20 Units Subcutaneous TID WC  . insulin aspart  0-5 Units Subcutaneous QHS  . insulin glargine  75 Units Subcutaneous Daily  . metroNIDAZOLE  500 mg Oral Q8H  . NIFEdipine  90 mg Oral Daily  . potassium chloride  10 mEq Oral Daily  . protein supplement shake  11 oz Oral  Q24H  . sodium chloride flush  3 mL Intravenous Q12H   Continuous Infusions: . sodium chloride    . cefTRIAXone (ROCEPHIN)  IV 2 g (03/17/17 1700)   PRN Meds: sodium chloride, acetaminophen **OR** acetaminophen, albuterol, cyclobenzaprine, HYDROmorphone (DILAUDID) injection, ondansetron **OR** ondansetron (ZOFRAN) IV, sodium chloride flush  Time spent: 35 minutes  Author: Lynden Oxford, MD [facility] Pager: [phone] 03/17/2017 6:10 PM  If 7PM-7AM, please contact night-coverage at [email or web address], password [facility]

## 2017-03-17 NOTE — Consult Note (Addendum)
WOC Nurse wound consult note Reason for Consult: Consult requested for bilat legs.  Pt states she is followed by the outpatient wound care center prior to admission and has been wearing compression wraps.  She reports they were not changed for many days and she developed a wound which had large amt tan drainage and foul odor when they were removed. Wound type: Right leg with several patchy areas of dry scabs,  No open wound or drainage Left calf with full thickness wound; appears to be a previous hematoma which ruptured and evolved into full thickness tissue loss. Loose gelatinous congealed blood removed from outer wound.  No further bleeding noted. Measurement: 4X3X.3cm Wound bed: Dark red and moist, surrounded by loose peeling skin. Drainage (amount, consistency, odor) Small amt pink drainage, no odor Dressing procedure/placement/frequency: Moist gauze dressing to assist with removal of remaining nonviable old blood in the wound bed.  Applied ace wrap for light compression.  Pt can resume followup at the outpatient wound care center.  Discussed plan of care with patient and she verbalized understanding. Please re-consult if further assistance is needed.  Thank-you,  Cammie Mcgee MSN, RN, CWOCN, [place], CNS [phone]

## 2017-03-18 ENCOUNTER — Encounter (HOSPITAL_BASED_OUTPATIENT_CLINIC_OR_DEPARTMENT_OTHER): Payer: Medicare PPO | Attending: Surgery

## 2017-03-18 DIAGNOSIS — E11622 Type 2 diabetes mellitus with other skin ulcer: Secondary | ICD-10-CM | POA: Insufficient documentation

## 2017-03-18 DIAGNOSIS — E11621 Type 2 diabetes mellitus with foot ulcer: Secondary | ICD-10-CM | POA: Diagnosis not present

## 2017-03-18 DIAGNOSIS — I87331 Chronic venous hypertension (idiopathic) with ulcer and inflammation of right lower extremity: Secondary | ICD-10-CM | POA: Insufficient documentation

## 2017-03-18 DIAGNOSIS — I890 Lymphedema, not elsewhere classified: Secondary | ICD-10-CM | POA: Insufficient documentation

## 2017-03-18 DIAGNOSIS — E119 Type 2 diabetes mellitus without complications: Secondary | ICD-10-CM | POA: Diagnosis not present

## 2017-03-18 DIAGNOSIS — E1151 Type 2 diabetes mellitus with diabetic peripheral angiopathy without gangrene: Secondary | ICD-10-CM | POA: Insufficient documentation

## 2017-03-18 DIAGNOSIS — L97829 Non-pressure chronic ulcer of other part of left lower leg with unspecified severity: Secondary | ICD-10-CM | POA: Insufficient documentation

## 2017-03-18 DIAGNOSIS — I10 Essential (primary) hypertension: Secondary | ICD-10-CM | POA: Insufficient documentation

## 2017-03-18 DIAGNOSIS — L97502 Non-pressure chronic ulcer of other part of unspecified foot with fat layer exposed: Secondary | ICD-10-CM

## 2017-03-18 DIAGNOSIS — Z794 Long term (current) use of insulin: Secondary | ICD-10-CM | POA: Diagnosis not present

## 2017-03-18 DIAGNOSIS — L97509 Non-pressure chronic ulcer of other part of unspecified foot with unspecified severity: Secondary | ICD-10-CM

## 2017-03-18 DIAGNOSIS — L03116 Cellulitis of left lower limb: Secondary | ICD-10-CM | POA: Diagnosis not present

## 2017-03-18 DIAGNOSIS — L97819 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 leg with unspecified severity: Secondary | ICD-10-CM | POA: Insufficient documentation

## 2017-03-18 DIAGNOSIS — L97319 Non-pressure chronic ulcer of right ankle with unspecified severity: Secondary | ICD-10-CM | POA: Insufficient documentation

## 2017-03-18 DIAGNOSIS — I87332 Chronic venous hypertension (idiopathic) with ulcer and inflammation of left lower extremity: Secondary | ICD-10-CM | POA: Insufficient documentation

## 2017-03-18 LAB — BASIC METABOLIC PANEL
Anion gap: 7 (ref 5–15)
BUN: 11 mg/dL (ref 6–20)
CALCIUM: 8.7 mg/dL — AB (ref 8.9–10.3)
CO2: 24 mmol/L (ref 22–32)
CREATININE: 0.96 mg/dL (ref 0.44–1.00)
Chloride: 105 mmol/L (ref 101–111)
GFR calc Af Amer: >60 mL/min (ref >60)
GFR, EST NON AFRICAN AMERICAN: 60 mL/min — AB (ref >60)
GLUCOSE: 172 mg/dL — AB (ref 65–99)
Potassium: 3.9 mmol/L (ref 3.5–5.1)
SODIUM: 136 mmol/L (ref 135–145)

## 2017-03-18 LAB — CBC
HCT: 35 % — ABNORMAL LOW (ref 36.0–46.0)
Hemoglobin: 11.2 g/dL — ABNORMAL LOW (ref 12.0–15.0)
MCH: 28.3 pg (ref 26.0–34.0)
MCHC: 32 g/dL (ref 30.0–36.0)
MCV: 88.4 fL (ref 78.0–100.0)
PLATELETS: 290 10*3/uL (ref 150–400)
RBC: 3.96 MIL/uL (ref 3.87–5.11)
RDW: 16.1 % — AB (ref 11.5–15.5)
WBC: 9.6 10*3/uL (ref 4.0–10.5)

## 2017-03-18 LAB — GLUCOSE, CAPILLARY
GLUCOSE-CAPILLARY: 138 mg/dL — AB (ref 65–99)
Glucose-Capillary: 153 mg/dL — ABNORMAL HIGH (ref 65–99)

## 2017-03-18 MED ORDER — CEPHALEXIN 500 MG PO CAPS: 500.0000 mg | ORAL_CAPSULE | Freq: Three times a day (TID) | ORAL | 0 refills | Status: AC

## 2017-03-18 MED ORDER — PREMIER PROTEIN SHAKE: 11.0000 [oz_av] | ORAL | 0 refills | Status: DC

## 2017-03-18 MED ORDER — DOXYCYCLINE HYCLATE 100 MG PO CAPS: 100.0000 mg | ORAL_CAPSULE | Freq: Two times a day (BID) | ORAL | 0 refills | Status: AC

## 2017-03-18 NOTE — Evaluation (Signed)
Physical Therapy Evaluation Patient Details Name: [name] MRN: [id number] DOB: Mar 09, 1951 Today's Date: 03/18/2017   History of Present Illness  Pt is a 66 year old female with PMH of chronic back pain, chronic wheelchair, asthma, hypertension, type II DM, depression, chronic venous stasis with edema and ulcer; admitted on 03/16/2017 with L LE cellulitis  Clinical Impression  Pt admitted with above diagnosis. Pt currently with functional limitations due to the deficits listed below (see PT Problem List).  Pt will benefit from skilled PT to increase their independence and safety with mobility to allow discharge to the venue listed below.  Pt very agreeable for OOB to recliner.  Pt reports trapeze bar over hospital bed at home is very helpful for her self assisting.  Pt typically transfers to power w/c for mobility.  Pt reports R knee pain today and RN notified.  Pt feels capable of safely mobilizing upon d/c home (prefers her home environment) however agreeable for PT to work with her in hospital to maintain her mobility for d/c home.     Follow Up Recommendations No PT follow up;Supervision for mobility/OOB    Equipment Recommendations  None recommended by PT    Recommendations for Other Services       Precautions / Restrictions Precautions Precautions: Fall      Mobility  Bed Mobility Overal bed mobility: Needs Assistance Bed Mobility: Supine to Sit     Supine to sit: Min guard;HOB elevated     General bed mobility comments: pt utilized HOB elevated and bed rail to self assist, increased time and effort however no physical assist required  Transfers Overall transfer level: Needs assistance Equipment used: Rolling walker (2 wheeled) Transfers: Sit to/from [employer] Sit to Stand: From elevated surface;Min guard Stand pivot transfers: Min guard       General transfer comment: pt reports R knee pain and IV in L antecubital fossa limiting her ability  to self assist so elevated bed slightly, min/guard for safety, pt with one instance of R knee buckling however self corrected with RW  Ambulation/Gait             General Gait Details: nonambulatory at baseline  Stairs            Wheelchair Mobility    Modified Rankin (Stroke Patients Only)       Balance Overall balance assessment: Needs assistance Sitting-balance support: No upper extremity supported;Feet supported Sitting balance-Leahy Scale: Good     Standing balance support: Bilateral upper extremity supported Standing balance-Leahy Scale: Poor Standing balance comment: requires UE support                             Pertinent Vitals/Pain Pain Assessment: 0-10 Pain Score: 6  Pain Location: R knee Pain Descriptors / Indicators: Aching;Sore Pain Intervention(s): Patient requesting pain meds-RN notified;Repositioned    Home Living Family/patient expects to be discharged to:: Private residence Living Arrangements: Spouse/significant other Available Help at Discharge: Family;Available 24 hours/day Type of Home: Apartment Home Access: Ramped entrance     Home Layout: One level Home Equipment: Wheelchair - manual;Wheelchair - power;Bedside commode;Walker - 2 wheels;Shower seat;Adaptive equipment;Other (comment);Hospital bed Additional Comments: Pt primarily uses the w/c to mobilize.    Prior Function Level of Independence: Needs assistance   Gait / Transfers Assistance Needed: mainly transfer to power w/c with stand pivot and RW, has grab bar above hospital bed at home  Hand Dominance        Extremity/Trunk Assessment        Lower Extremity Assessment Lower Extremity Assessment: Generalized weakness (compression wrapping to L lower leg)    Cervical / Trunk Assessment Cervical / Trunk Assessment: Other exceptions Cervical / Trunk Exceptions: increased abdominal body habitus limiting ROM  Communication   Communication:  No difficulties  Cognition Arousal/Alertness: Awake/alert Behavior During Therapy: WFL for tasks assessed/performed Overall Cognitive Status: Within Functional Limits for tasks assessed                                        General Comments      Exercises     Assessment/Plan    PT Assessment Patient needs continued PT services  PT Problem List Decreased strength;Decreased mobility;Decreased balance;Decreased activity tolerance;Decreased skin integrity       PT Treatment Interventions DME instruction;Gait training;Therapeutic exercise;Therapeutic activities;Patient/family education;Functional mobility training;Wheelchair mobility training;Balance training    PT Goals (Current goals can be found in the Care Plan section)  Acute Rehab PT Goals PT Goal Formulation: With patient Time For Goal Achievement: 04/01/17 Potential to Achieve Goals: Good    Frequency Min 3X/week   Barriers to discharge        Co-evaluation               AM-PAC PT "6 Clicks" Daily Activity  Outcome Measure Difficulty turning over in bed (including adjusting bedclothes, sheets and blankets)?: A Lot Difficulty moving from lying on back to sitting on the side of the bed? : A Lot Difficulty sitting down on and standing up from a chair with arms (e.g., wheelchair, bedside commode, etc,.)?: Unable Help needed moving to and from a bed to chair (including a wheelchair)?: A Little Help needed walking in hospital room?: Total Help needed climbing 3-5 steps with a railing? : Total 6 Click Score: 10    End of Session   Activity Tolerance: Patient tolerated treatment well Patient left: in chair;with call bell/phone within reach Nurse Communication: Mobility status;Patient requests pain meds PT Visit Diagnosis: Other abnormalities of gait and mobility (R26.89)    Time: 4098-1191 PT Time Calculation (min) (ACUTE ONLY): 20 min   Charges:   PT Evaluation $PT Eval Low Complexity: 1  Low     PT G Codes:   PT G-Codes **NOT FOR INPATIENT CLASS** Functional Assessment Tool Used: AM-PAC 6 Clicks Basic Mobility;Clinical judgement Functional Limitation: Mobility: Walking and moving around Mobility: Walking and Moving Around Current Status (Y7829): At least 20 percent but less than 40 percent impaired, limited or restricted Mobility: Walking and Moving Around Goal Status [phone]): At least 1 percent but less than 20 percent impaired, limited or restricted   Zenovia Jarred, PT, DPT 03/18/2017 Pager: [phone]  Maida Sale E 03/18/2017, 3:11 PM

## 2017-03-18 NOTE — Discharge Summary (Signed)
Physician Discharge Summary  HARTLEIGH EDMONSTON ZOX:[id number] DOB: 04/22/1951 DOA: 03/16/2017  PCP: Tracey Harries, MD  Admit date: 03/16/2017 Discharge date: 03/18/2017  Time spent: 60 minutes  Recommendations for Outpatient Follow-up:  1. Follow-up at [facility] long care center in 2 days. 2. Follow-up with Tracey Harries, MD in 2 weeks. On follow-up patient will need a basic metabolic profile done to follow-up on electrolytes and renal function. Patient also need a CBC done to follow-up on leukocytosis. Patient cellulitis will need to be assessed for continued improvement.    Discharge Diagnoses:  Active Problems:   Insulin-requiring or dependent type II diabetes mellitus (HCC)   Morbid obesity (HCC)   Wheelchair dependent   Asthma   Left leg cellulitis   Cellulitis   Discharge Condition: Stable and improved  Diet recommendation: Heart healthy  Filed Weights   03/16/17 1200 03/16/17 1658  Weight: (!) 145.2 kg (320 lb) (!) 153.4 kg (338 lb 3 oz)    History of present illness:  Per Dr Carrolyn Leigh  is a 66 y.o. female, With history of chronic back pain, wheelchair-bound, asthma, hypertension, type 2 diabetes mellitus, hyperlipidemia, depression, gout came to hospital with pain in the lower extremities. Patient has chronic wounds on the lower extremities and was seen by wound care as outpatient. Patient noticed drainage from both legs, left more than right. She also started feeling warm, fall spell and discharge so she came to hospital for further evaluation. Patient also has been coming off chills. Low-grade fever.  She denied nausea, vomiting or diarrhea. Complained of chest pain 2 days ago which lasted for a few minutes. No previous history of CAD. She: Didn't complain of chest pain at this time. Denied shortness of breath. No dysuria. She had an bilateral ABI done in May 2018 which did not show significant PAD.  In the ED patient was started on vancomycin and Zosyn per  pharmacy consultation. X-ray of the lower extremities showed no bony involvement. C-reactive protein, sedimentation rate and blood cultures obtained in the ED.   Hospital Course:  1. Left lower extremity cellulitis/infected wounds- patient's WBC 14,000, lactic acid 1.26 on admission. She had been started on vancomycin and Zosyn in the ED. X-ray of the lower extremity showed no bony involvement.mild elevated CRP. Wound care consulted who made some recommendations to patient's wounds on both legs. IV antibiotics was subsequently changed from IV vancomycin and Zosyn to IV Rocephin and Flagyl. Patient improved clinically and remained afebrile. Leukocytosis trended down and had resolved by day of discharge. Patient noted to have a recent ABI May 2018 which showed no significant bilateral PAD. Patient will be discharged home on 5 more days of oral Keflex and doxycycline to complete a seven-day course of antibiotic treatment. Patient is to follow-up with the wound care center 2 days postdischarge. Outpatient follow-up. appreciated.   2. Diabetes mellitus, type 2- A1c noted to be 7.1. Patient's blood glucose levels were well controlled during the hospitalization and patient maintained on a sliding scale insulin and home dose long-acting insulin.   3. Hypertension-blood pressure remained controlled during the hospitalization on home regimen of  nifedipine, atenolol  4. History of gout-stable, continued on home regimen of allopurinol  5. History of asthma-no acute exacerbation, continued on home regimen of albuterol inhaler when necessary  6. Recent history of chest pain-2 days ago, resolved. No chest pain noted during the hospitalization. Cardiac enzymes were cycled which were negative 3. Outpatient follow-up.   Procedures:  Plain films of  bilateral tib-fib 03/16/2017    Consultations:  Wound care  Discharge Exam: Vitals:   03/18/17 0523 03/18/17 1349  BP: 121/72 (!) 124/58  Pulse: 94  77  Resp: 18 20  Temp: 99.4 F (37.4 C) 98.4 F (36.9 C)  SpO2: 97% 99%    General: NAD Cardiovascular: RRR Respiratory: CTAB Extremities LLE wrapped.  Discharge Instructions   Discharge Instructions    Diet - low sodium heart healthy    Complete by:  As directed    Increase activity slowly    Complete by:  As directed      Current Discharge Medication List    START taking these medications   Details  cephALEXin (KEFLEX) 500 MG capsule Take 1 capsule (500 mg total) by mouth 3 (three) times daily. Qty: 15 capsule, Refills: 0    doxycycline (VIBRAMYCIN) 100 MG capsule Take 1 capsule (100 mg total) by mouth 2 (two) times daily. Qty: 10 capsule, Refills: 0    protein supplement shake (PREMIER PROTEIN) LIQD Take 325 mLs (11 oz total) by mouth daily. Refills: 0      CONTINUE these medications which have NOT CHANGED   Details  allopurinol (ZYLOPRIM) 300 MG tablet Take 300 mg by mouth daily.    aspirin 325 MG tablet Take 325 mg by mouth daily.    atenolol (TENORMIN) 25 MG tablet Take 25-50 mg by mouth 2 (two) times daily. Take 50 mg every morning and then 25 mg every evening    Cholecalciferol (VITAMIN D3) 2000 units capsule Take 2,000 Units by mouth daily.    cyclobenzaprine (FLEXERIL) 10 MG tablet Take 10 mg by mouth at bedtime as needed for muscle spasms.     furosemide (LASIX) 20 MG tablet Take 1 tablet (20 mg total) by mouth daily. Qty: 30 tablet, Refills: 0    gabapentin (NEURONTIN) 300 MG capsule Take 1 capsule (300 mg total) by mouth 2 (two) times daily. Qty: 60 capsule, Refills: 2    hydrocerin (EUCERIN) CREA Apply 1 application topically daily. Qty: 228 g, Refills: 0    HYDROcodone-acetaminophen (NORCO/VICODIN) 5-325 MG tablet Take 1-2 tablets by mouth every 6 (six) hours as needed. Qty: 15 tablet, Refills: 0    insulin degludec (TRESIBA FLEXTOUCH) 100 UNIT/ML SOPN FlexTouch Pen Inject 0.75 mLs (75 Units total) into the skin daily. Qty: 3 mL, Refills: 1     NIFEdipine (PROCARDIA XL/ADALAT-CC) 90 MG 24 hr tablet Take 90 mg by mouth daily.    NOVOLOG FLEXPEN 100 UNIT/ML FlexPen Take 17 Units by mouth 2 (two) times daily. Refills: 1    potassium chloride (K-DUR) 10 MEQ tablet Take 1 tablet (10 mEq total) by mouth daily. Qty: 30 tablet, Refills: 0    acetaminophen (TYLENOL) 500 MG tablet Take 500 mg by mouth daily as needed for moderate pain.    albuterol (PROVENTIL HFA;VENTOLIN HFA) 108 (90 Base) MCG/ACT inhaler Inhale 2 puffs into the lungs every 4 (four) hours as needed for wheezing or shortness of breath.     diclofenac sodium (VOLTAREN) 1 % GEL Apply 4 g topically 4 (four) times daily. Qty: 1 Tube, Refills: 1   Associated Diagnoses: Chronic knee pain, right    magnesium gluconate (MAGONATE) 500 MG tablet Take 500 mg by mouth daily.    polyethylene glycol (MIRALAX / GLYCOLAX) packet Take 17 g by mouth daily as needed for moderate constipation.       Allergies  Allergen Reactions  . Ace Inhibitors Swelling    SWELLING REACTION UNSPECIFIED   .  Metformin And Related Other (See Comments)    CHILLS  . Propofol Itching   Follow-up Information    Tracey Harries, MD. Schedule an appointment as soon as possible for a visit in 2 week(s).   Specialty:  Family Medicine Contact information: [address] [place] [address] [phone]        [facility]              Follow up in 2 day(s).   Why:  f/u in 2 days Contact information: [address] [place] [address] [phone]           The results of significant diagnostics from this hospitalization (including imaging, microbiology, ancillary and laboratory) are listed below for reference.    Significant Diagnostic Studies: Dg Shoulder Right  Result Date: 02/25/2017 CLINICAL DATA:  Right arm pain and weakness x3 days EXAM: RIGHT SHOULDER - 2+ VIEW COMPARISON:  None. FINDINGS: There is no evidence  of fracture or dislocation. Mild degenerative spurring noted off the acromion at the [facility] joint. The included right lung and ribs are nonacute. Soft tissues are unremarkable. IMPRESSION: No acute fracture nor dislocation of the right shoulder. Minimal degenerative spurring off the acromion at the [facility] joint. Electronically Signed   By: Tollie Eth M.D.   On: 02/25/2017 13:36   Dg Tibia/fibula Left  Result Date: 03/16/2017 CLINICAL DATA:  Open wounds.  Diabetes mellitus EXAM: LEFT TIBIA AND FIBULA - 2 VIEW COMPARISON:  None. FINDINGS: Frontal and lateral views were obtained. There is generalized soft tissue swelling. There is evidence of soft tissue irregularity anterior to the distal tibial diaphysis, likely soft tissue wound. There is no air within the soft tissues in this area. There are calcifications throughout the soft tissues, likely phleboliths with venous insufficiency. There is no evident fracture or dislocation. No abnormal periosteal reaction. No erosive change or bony destruction. There is osteoarthritic change in the knee joint medially. IMPRESSION: Generalized soft tissue swelling. Soft tissue irregularity anterior the distal tibia, likely soft tissue ulceration. No bony destruction or erosion. No fracture or dislocation. Osteoarthritic change noted in the knee joint. Multiple vascular calcifications appear venous and most likely represent chronic venous insufficiency/stasis. Electronically Signed   By: Bretta Bang III M.D.   On: 03/16/2017 08:49   Dg Tibia/fibula Right  Result Date: 03/16/2017 CLINICAL DATA:  Open wounds.  Diabetes mellitus EXAM: RIGHT TIBIA AND FIBULA - 2 VIEW COMPARISON:  None. FINDINGS: Frontal and lateral views were obtained. There is generalized soft tissue swelling. Multiple soft tissue calcifications are felt to represent venous stasis/ phleboliths. No fracture or dislocation. No erosive change or bony destruction. No abnormal periosteal reaction. There is extensive  arthropathy in the right knee joint. IMPRESSION: No erosive change or bony destruction. No fracture or dislocation. 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 Probable chronic venous stasis with multiple vascular calcifications/phleboliths. Electronically Signed   By: Bretta Bang III M.D.   On: 03/16/2017 08:50   Ct Head Wo Contrast  Result Date: 02/25/2017 CLINICAL DATA:  Headache and arm pain for 3 days EXAM: CT HEAD WITHOUT CONTRAST TECHNIQUE: Contiguous axial images were obtained from the base of the skull through the vertex without intravenous contrast. COMPARISON:  None. FINDINGS: Brain: No evidence of acute infarction, hemorrhage, hydrocephalus, extra-axial collection or mass lesion/mass effect. Suspect mild patchy low-density in the cerebral white matter, vascular risk factors in the medical history favoring chronic microvascular  ischemia. Normal brain volume. Vascular: No hyperdense vessel or unexpected calcification. Skull: No acute or aggressive finding Sinuses/Orbits: Negative IMPRESSION: 1. No acute finding. 2. Possible mild chronic small vessel ischemia in the cerebral white matter. Electronically Signed   By: Marnee Spring M.D.   On: 02/25/2017 13:52   Mr Brain Wo Contrast (neuro Protocol)  Result Date: 02/26/2017 CLINICAL DATA:  66 y/o  F; right-sided weakness. EXAM: MRI HEAD WITHOUT CONTRAST TECHNIQUE: Axial DWI, coronal DWI, axial T2, and axial T2 FLAIR sequences were acquired. The patient was unable to continue. COMPARISON:  05/31/2016 MRI of the head. FINDINGS: Brain: Motion degradation of multiple sequences. No reduced diffusion to suggest acute or early subacute infarction. Stable scattered foci of T2 FLAIR hyperintense signal abnormality in subcortical white matter and the right hemi pons compatible with chronic microvascular ischemic changes. No hydrocephalus, effacement of basilar cisterns, or extra-axial collection. Vascular: Normal flow voids.  Skull and upper cervical spine: No abnormal T2 or diffusion signal. Sinuses/Orbits: Negative. Other: None. IMPRESSION: 1. Motion degraded study with diffusion and T2 weighted sequences. 2. No acute intracranial abnormality identified. 3. Stable chronic microvascular ischemic changes of the brain. Electronically Signed   By: Mitzi Hansen M.D.   On: 02/26/2017 03:33    Microbiology: Recent Results (from the past 240 hour(s))  Blood culture (routine x 2)     Status: None (Preliminary result)   Collection Time: 03/16/17  8:07 AM  Result Value Ref Range Status   Specimen Description BLOOD LEFT ANTECUBITAL  Final   Special Requests   Final    BOTTLES DRAWN AEROBIC AND ANAEROBIC Blood Culture adequate volume   Culture   Final    NO GROWTH 2 DAYS Performed at [facility], [address], [place], [place] [address]    Report Status PENDING  Incomplete  Blood culture (routine x 2)     Status: None (Preliminary result)   Collection Time: 03/16/17  8:09 AM  Result Value Ref Range Status   Specimen Description BLOOD RIGHT ANTECUBITAL  Final   Special Requests   Final    BOTTLES DRAWN AEROBIC AND ANAEROBIC Blood Culture adequate volume   Culture   Final    NO GROWTH 2 DAYS Performed at [facility], [address], [place], [place] [address]    Report Status PENDING  Incomplete     Labs: Basic Metabolic Panel:  Recent Labs Lab 03/15/17 2049 03/16/17 1558 03/16/17 2153 03/17/17 0420 03/18/17 0354  NA 140  --  137 137 136  K 3.6  --  3.4* 3.7 3.9  CL 106  --  106 106 105  CO2 26  --  23 24 24   GLUCOSE 82  --  142* 175* 172*  BUN 10  --  10 9 11   CREATININE 0.82 0.83 0.84 0.87 0.96  CALCIUM 9.4  --  8.6* 8.6* 8.7*   Liver Function Tests:  Recent Labs Lab 03/15/17 2049 03/17/17 0420  AST 16 16  ALT 14 13*  ALKPHOS 116 101  BILITOT 0.1* 0.5  PROT 9.5* 7.7  ALBUMIN 3.5 2.9*   No results for input(s): LIPASE, AMYLASE in the last 168 hours. No results  for input(s): AMMONIA in the last 168 hours. CBC:  Recent Labs Lab 03/15/17 2049 03/16/17 1558 03/16/17 2153 03/17/17 0420 03/18/17 0354  WBC 14.6* 10.5 8.4 8.4 9.6  NEUTROABS 12.1*  --  5.7  --   --   HGB 13.5 11.9* 11.3* 11.0* 11.2*  HCT 40.7 36.7 34.5* 34.0* 35.0*  MCV 89.1 89.1 88.9 87.4 88.4  PLT 331 291 302 282 290   Cardiac Enzymes:  Recent Labs Lab 03/16/17 1558 03/16/17 2153 03/17/17 0040  TROPONINI <0.03 <0.03 <0.03   BNP: BNP (last 3 results) No results for input(s): BNP in the last 8760 hours.  ProBNP (last 3 results) No results for input(s): PROBNP in the last 8760 hours.  CBG:  Recent Labs Lab 03/17/17 1221 03/17/17 1709 03/17/17 2006 03/18/17 0755 03/18/17 1148  GLUCAP 186* 160* 141* 138* 153*       Signed:  THOMPSON,DANIEL MD.  [facility] 03/18/2017, 4:15 PM

## 2017-03-18 NOTE — Care Management Obs Status (Signed)
MEDICARE OBSERVATION STATUS NOTIFICATION   Patient Details  Name: [name] MRN: [id number] Date of Birth: 1950-06-18   Medicare Observation Status Notification Given:  Yes    Bartholome Bill, RN 03/18/2017, 10:19 AM

## 2017-03-18 NOTE — Progress Notes (Signed)
Pt discharged home. Discharge education completed, paper rx and all pt belongings returned to pt. Wound care completed prior to discharge.

## 2017-03-18 NOTE — Progress Notes (Signed)
Discharge plan is for pt to dc back home with [facility] from [facility] to assist with wound care and to return to the wound care center. Pt has all DME needed. [facility] rep alerted of discharge and resumption orders. Sandford Craze RN,BSN,NCM [phone]

## 2017-03-21 LAB — CULTURE, BLOOD (ROUTINE X 2)
CULTURE: NO GROWTH
Culture: NO GROWTH
SPECIAL REQUESTS: ADEQUATE
SPECIAL REQUESTS: ADEQUATE

## 2017-03-25 DIAGNOSIS — L97319 Non-pressure chronic ulcer of right ankle with unspecified severity: Secondary | ICD-10-CM | POA: Diagnosis not present

## 2017-03-25 DIAGNOSIS — I890 Lymphedema, not elsewhere classified: Secondary | ICD-10-CM | POA: Diagnosis not present

## 2017-03-25 DIAGNOSIS — I87331 Chronic venous hypertension (idiopathic) with ulcer and inflammation of right lower extremity: Secondary | ICD-10-CM | POA: Diagnosis not present

## 2017-03-25 DIAGNOSIS — E11622 Type 2 diabetes mellitus with other skin ulcer: Secondary | ICD-10-CM | POA: Diagnosis not present

## 2017-03-25 DIAGNOSIS — L97819 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 leg with unspecified severity: Secondary | ICD-10-CM | POA: Diagnosis not present

## 2017-03-25 DIAGNOSIS — I87332 Chronic venous hypertension (idiopathic) with ulcer and inflammation of left lower extremity: Secondary | ICD-10-CM | POA: Diagnosis not present

## 2017-03-25 DIAGNOSIS — L97829 Non-pressure chronic ulcer of other part of left lower leg with unspecified severity: Secondary | ICD-10-CM | POA: Diagnosis not present

## 2017-03-25 DIAGNOSIS — I10 Essential (primary) hypertension: Secondary | ICD-10-CM | POA: Diagnosis not present

## 2017-03-25 DIAGNOSIS — E1151 Type 2 diabetes mellitus with diabetic peripheral angiopathy without gangrene: Secondary | ICD-10-CM | POA: Diagnosis not present

## 2017-04-01 DIAGNOSIS — E11622 Type 2 diabetes mellitus with other skin ulcer: Secondary | ICD-10-CM | POA: Diagnosis not present

## 2017-04-08 DIAGNOSIS — E11622 Type 2 diabetes mellitus with other skin ulcer: Secondary | ICD-10-CM | POA: Diagnosis not present

## 2017-04-10 ENCOUNTER — Other Ambulatory Visit: Payer: Self-pay | Admitting: Family Medicine

## 2017-04-10 DIAGNOSIS — E1151 Type 2 diabetes mellitus with diabetic peripheral angiopathy without gangrene: Secondary | ICD-10-CM

## 2017-04-10 DIAGNOSIS — E1165 Type 2 diabetes mellitus with hyperglycemia: Principal | ICD-10-CM

## 2017-04-10 DIAGNOSIS — IMO0002 Reserved for concepts with insufficient information to code with codable children: Secondary | ICD-10-CM

## 2017-04-15 DIAGNOSIS — E11622 Type 2 diabetes mellitus with other skin ulcer: Secondary | ICD-10-CM | POA: Diagnosis not present

## 2017-04-22 ENCOUNTER — Encounter (HOSPITAL_BASED_OUTPATIENT_CLINIC_OR_DEPARTMENT_OTHER): Payer: Medicare PPO | Attending: Surgery

## 2017-04-22 DIAGNOSIS — L97929 Non-pressure chronic ulcer of unspecified part of left lower leg with unspecified severity: Secondary | ICD-10-CM | POA: Insufficient documentation

## 2017-04-22 DIAGNOSIS — I10 Essential (primary) hypertension: Secondary | ICD-10-CM | POA: Insufficient documentation

## 2017-04-22 DIAGNOSIS — I87333 Chronic venous hypertension (idiopathic) with ulcer and inflammation of bilateral lower extremity: Secondary | ICD-10-CM | POA: Insufficient documentation

## 2017-04-22 DIAGNOSIS — E11622 Type 2 diabetes mellitus with other skin ulcer: Secondary | ICD-10-CM | POA: Insufficient documentation

## 2017-04-22 DIAGNOSIS — L97819 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 leg with unspecified severity: Secondary | ICD-10-CM | POA: Insufficient documentation

## 2017-04-22 DIAGNOSIS — I890 Lymphedema, not elsewhere classified: Secondary | ICD-10-CM | POA: Diagnosis not present

## 2017-04-29 DIAGNOSIS — E11622 Type 2 diabetes mellitus with other skin ulcer: Secondary | ICD-10-CM | POA: Diagnosis not present

## 2017-05-06 DIAGNOSIS — E11622 Type 2 diabetes mellitus with other skin ulcer: Secondary | ICD-10-CM | POA: Diagnosis not present

## 2017-05-13 DIAGNOSIS — E11622 Type 2 diabetes mellitus with other skin ulcer: Secondary | ICD-10-CM | POA: Diagnosis not present

## 2017-05-20 ENCOUNTER — Encounter (HOSPITAL_BASED_OUTPATIENT_CLINIC_OR_DEPARTMENT_OTHER): Payer: Medicare PPO | Attending: Surgery

## 2017-05-20 DIAGNOSIS — L97819 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 leg with unspecified severity: Secondary | ICD-10-CM | POA: Diagnosis not present

## 2017-05-20 DIAGNOSIS — L97829 Non-pressure chronic ulcer of other part of left lower leg with unspecified severity: Secondary | ICD-10-CM | POA: Diagnosis not present

## 2017-05-20 DIAGNOSIS — I10 Essential (primary) hypertension: Secondary | ICD-10-CM | POA: Insufficient documentation

## 2017-05-20 DIAGNOSIS — E1151 Type 2 diabetes mellitus with diabetic peripheral angiopathy without gangrene: Secondary | ICD-10-CM | POA: Diagnosis not present

## 2017-05-20 DIAGNOSIS — I87333 Chronic venous hypertension (idiopathic) with ulcer and inflammation of bilateral lower extremity: Secondary | ICD-10-CM | POA: Insufficient documentation

## 2017-05-20 DIAGNOSIS — I890 Lymphedema, not elsewhere classified: Secondary | ICD-10-CM | POA: Insufficient documentation

## 2017-05-20 DIAGNOSIS — E11622 Type 2 diabetes mellitus with other skin ulcer: Secondary | ICD-10-CM | POA: Diagnosis present

## 2017-05-20 DIAGNOSIS — M5432 Sciatica, left side: Secondary | ICD-10-CM | POA: Insufficient documentation

## 2017-05-27 DIAGNOSIS — E11622 Type 2 diabetes mellitus with other skin ulcer: Secondary | ICD-10-CM | POA: Diagnosis not present

## 2017-06-03 DIAGNOSIS — E11622 Type 2 diabetes mellitus with other skin ulcer: Secondary | ICD-10-CM | POA: Diagnosis not present

## 2017-06-17 ENCOUNTER — Encounter (HOSPITAL_BASED_OUTPATIENT_CLINIC_OR_DEPARTMENT_OTHER): Payer: Medicare PPO | Attending: Physician Assistant

## 2017-06-17 DIAGNOSIS — E11622 Type 2 diabetes mellitus with other skin ulcer: Secondary | ICD-10-CM | POA: Insufficient documentation

## 2017-06-17 DIAGNOSIS — I2510 Atherosclerotic heart disease of native coronary artery without angina pectoris: Secondary | ICD-10-CM | POA: Diagnosis not present

## 2017-06-17 DIAGNOSIS — I10 Essential (primary) hypertension: Secondary | ICD-10-CM | POA: Insufficient documentation

## 2017-06-17 DIAGNOSIS — L97829 Non-pressure chronic ulcer of other part of left lower leg with unspecified severity: Secondary | ICD-10-CM | POA: Diagnosis not present

## 2017-06-17 DIAGNOSIS — I890 Lymphedema, not elsewhere classified: Secondary | ICD-10-CM | POA: Diagnosis not present

## 2017-07-01 DIAGNOSIS — E11622 Type 2 diabetes mellitus with other skin ulcer: Secondary | ICD-10-CM | POA: Diagnosis not present

## 2017-08-05 ENCOUNTER — Encounter (HOSPITAL_BASED_OUTPATIENT_CLINIC_OR_DEPARTMENT_OTHER): Payer: Medicare PPO | Attending: Physician Assistant

## 2017-08-05 DIAGNOSIS — L97822 Non-pressure chronic ulcer of other part of left lower leg with fat layer exposed: Secondary | ICD-10-CM | POA: Insufficient documentation

## 2017-08-05 DIAGNOSIS — I10 Essential (primary) hypertension: Secondary | ICD-10-CM | POA: Diagnosis not present

## 2017-08-05 DIAGNOSIS — I87333 Chronic venous hypertension (idiopathic) with ulcer and inflammation of bilateral lower extremity: Secondary | ICD-10-CM | POA: Diagnosis not present

## 2017-08-05 DIAGNOSIS — I890 Lymphedema, not elsewhere classified: Secondary | ICD-10-CM | POA: Insufficient documentation

## 2017-08-05 DIAGNOSIS — R531 Weakness: Secondary | ICD-10-CM | POA: Diagnosis not present

## 2017-08-05 DIAGNOSIS — L97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 leg with fat layer exposed: Secondary | ICD-10-CM | POA: Insufficient documentation

## 2017-08-05 DIAGNOSIS — E11622 Type 2 diabetes mellitus with other skin ulcer: Secondary | ICD-10-CM | POA: Insufficient documentation

## 2017-08-05 DIAGNOSIS — F418 Other specified anxiety disorders: Secondary | ICD-10-CM | POA: Diagnosis not present

## 2017-08-05 DIAGNOSIS — Z6841 Body Mass Index (BMI) 40.0 and over, adult: Secondary | ICD-10-CM | POA: Diagnosis not present

## 2017-08-10 ENCOUNTER — Emergency Department (HOSPITAL_COMMUNITY)
Admission: EM | Admit: 2017-08-10 | Discharge: 2017-08-11 | Disposition: A | Discharge status: Home / Self Care | Payer: Medicare PPO | Source: Home / Self Care

## 2017-08-10 ENCOUNTER — Encounter (HOSPITAL_COMMUNITY): Payer: Self-pay | Admitting: *Deleted

## 2017-08-10 ENCOUNTER — Other Ambulatory Visit: Payer: Self-pay

## 2017-08-10 DIAGNOSIS — E119 Type 2 diabetes mellitus without complications: Secondary | ICD-10-CM | POA: Diagnosis not present

## 2017-08-10 DIAGNOSIS — L03116 Cellulitis of left lower limb: Secondary | ICD-10-CM | POA: Diagnosis not present

## 2017-08-10 DIAGNOSIS — M79662 Pain in left lower leg: Secondary | ICD-10-CM

## 2017-08-10 DIAGNOSIS — F419 Anxiety disorder, unspecified: Secondary | ICD-10-CM | POA: Diagnosis not present

## 2017-08-10 DIAGNOSIS — Z5321 Procedure and treatment not carried out due to patient leaving prior to being seen by health care provider: Secondary | ICD-10-CM

## 2017-08-10 DIAGNOSIS — Z7982 Long term (current) use of aspirin: Secondary | ICD-10-CM | POA: Diagnosis not present

## 2017-08-10 DIAGNOSIS — J45909 Unspecified asthma, uncomplicated: Secondary | ICD-10-CM | POA: Diagnosis not present

## 2017-08-10 DIAGNOSIS — R509 Fever, unspecified: Secondary | ICD-10-CM | POA: Insufficient documentation

## 2017-08-10 DIAGNOSIS — Z8673 Personal history of transient ischemic attack (TIA), and cerebral infarction without residual deficits: Secondary | ICD-10-CM | POA: Diagnosis not present

## 2017-08-10 DIAGNOSIS — R2242 Localized swelling, mass and lump, left lower limb: Secondary | ICD-10-CM | POA: Diagnosis present

## 2017-08-10 DIAGNOSIS — Z79899 Other long term (current) drug therapy: Secondary | ICD-10-CM | POA: Diagnosis not present

## 2017-08-10 DIAGNOSIS — F329 Major depressive disorder, single episode, unspecified: Secondary | ICD-10-CM | POA: Diagnosis not present

## 2017-08-10 DIAGNOSIS — Z9049 Acquired absence of other specified parts of digestive tract: Secondary | ICD-10-CM | POA: Diagnosis not present

## 2017-08-10 DIAGNOSIS — I10 Essential (primary) hypertension: Secondary | ICD-10-CM | POA: Diagnosis not present

## 2017-08-10 DIAGNOSIS — Z794 Long term (current) use of insulin: Secondary | ICD-10-CM | POA: Diagnosis not present

## 2017-08-10 LAB — COMPREHENSIVE METABOLIC PANEL
ALT: 14 U/L (ref 14–54)
AST: 17 U/L (ref 15–41)
Albumin: 3.1 g/dL — ABNORMAL LOW (ref 3.5–5.0)
Alkaline Phosphatase: 99 U/L (ref 38–126)
Anion gap: 12 (ref 5–15)
BILIRUBIN TOTAL: 0.4 mg/dL (ref 0.3–1.2)
BUN: 12 mg/dL (ref 6–20)
CO2: 22 mmol/L (ref 22–32)
CREATININE: 0.95 mg/dL (ref 0.44–1.00)
Calcium: 9.3 mg/dL (ref 8.9–10.3)
Chloride: 106 mmol/L (ref 101–111)
Glucose, Bld: 74 mg/dL (ref 65–99)
POTASSIUM: 3.8 mmol/L (ref 3.5–5.1)
Sodium: 140 mmol/L (ref 135–145)
TOTAL PROTEIN: 9 g/dL — AB (ref 6.5–8.1)

## 2017-08-10 LAB — CBC WITH DIFFERENTIAL/PLATELET
BASOS ABS: 0 10*3/uL (ref 0.0–0.1)
Basophils Relative: 0 %
Eosinophils Absolute: 0.1 10*3/uL (ref 0.0–0.7)
Eosinophils Relative: 1 %
HCT: 40.6 % (ref 36.0–46.0)
Hemoglobin: 13 g/dL (ref 12.0–15.0)
LYMPHS ABS: 2 10*3/uL (ref 0.7–4.0)
Lymphocytes Relative: 22 %
MCH: 29.3 pg (ref 26.0–34.0)
MCHC: 32 g/dL (ref 30.0–36.0)
MCV: 91.6 fL (ref 78.0–100.0)
MONOS PCT: 5 %
Monocytes Absolute: 0.4 10*3/uL (ref 0.1–1.0)
Neutro Abs: 6.4 10*3/uL (ref 1.7–7.7)
Neutrophils Relative %: 72 %
PLATELETS: 400 10*3/uL (ref 150–400)
RBC: 4.43 MIL/uL (ref 3.87–5.11)
RDW: 16.5 % — AB (ref 11.5–15.5)
WBC: 8.9 10*3/uL (ref 4.0–10.5)

## 2017-08-10 LAB — I-STAT CG4 LACTIC ACID, ED: Lactic Acid, Venous: 0.87 mmol/L (ref 0.5–1.9)

## 2017-08-10 NOTE — ED Notes (Signed)
Pt stated she would come back in the morning to be seen. Pt left the lobby with family member at this time.

## 2017-08-10 NOTE — ED Triage Notes (Signed)
Pt reports [facility] for cellulitis to left lower leg. Had dressing placed last week and was suppose to have home health RN come change dressing on Friday but they have not shown up. Pt attempted to change dressing herself but reports it was sticking and pulling her skin off. Had bandage on pta. Reports mild fever and feeling "sick." no acute distress is noted at triage.

## 2017-08-11 ENCOUNTER — Other Ambulatory Visit: Payer: Self-pay

## 2017-08-11 ENCOUNTER — Emergency Department (HOSPITAL_COMMUNITY): Payer: Medicare PPO

## 2017-08-11 ENCOUNTER — Encounter (HOSPITAL_COMMUNITY): Payer: Self-pay | Admitting: Emergency Medicine

## 2017-08-11 ENCOUNTER — Emergency Department (HOSPITAL_COMMUNITY)
Admission: EM | Admit: 2017-08-11 | Discharge: 2017-08-11 | Disposition: A | Discharge status: Home / Self Care | Payer: Medicare PPO | Attending: Physician Assistant | Admitting: Physician Assistant

## 2017-08-11 DIAGNOSIS — F329 Major depressive disorder, single episode, unspecified: Secondary | ICD-10-CM | POA: Insufficient documentation

## 2017-08-11 DIAGNOSIS — Z79899 Other long term (current) drug therapy: Secondary | ICD-10-CM | POA: Insufficient documentation

## 2017-08-11 DIAGNOSIS — Z9049 Acquired absence of other specified parts of digestive tract: Secondary | ICD-10-CM | POA: Insufficient documentation

## 2017-08-11 DIAGNOSIS — Z794 Long term (current) use of insulin: Secondary | ICD-10-CM | POA: Insufficient documentation

## 2017-08-11 DIAGNOSIS — Z7982 Long term (current) use of aspirin: Secondary | ICD-10-CM | POA: Insufficient documentation

## 2017-08-11 DIAGNOSIS — J45909 Unspecified asthma, uncomplicated: Secondary | ICD-10-CM | POA: Insufficient documentation

## 2017-08-11 DIAGNOSIS — I10 Essential (primary) hypertension: Secondary | ICD-10-CM | POA: Insufficient documentation

## 2017-08-11 DIAGNOSIS — E119 Type 2 diabetes mellitus without complications: Secondary | ICD-10-CM | POA: Insufficient documentation

## 2017-08-11 DIAGNOSIS — L03116 Cellulitis of left lower limb: Secondary | ICD-10-CM

## 2017-08-11 DIAGNOSIS — F419 Anxiety disorder, unspecified: Secondary | ICD-10-CM | POA: Insufficient documentation

## 2017-08-11 DIAGNOSIS — Z8673 Personal history of transient ischemic attack (TIA), and cerebral infarction without residual deficits: Secondary | ICD-10-CM | POA: Insufficient documentation

## 2017-08-11 LAB — CBG MONITORING, ED
GLUCOSE-CAPILLARY: 57 mg/dL — AB (ref 65–99)
GLUCOSE-CAPILLARY: 119 mg/dL — AB (ref 65–99)

## 2017-08-11 MED ORDER — DOXYCYCLINE HYCLATE 100 MG PO CAPS: 100.0000 mg | ORAL_CAPSULE | Freq: Two times a day (BID) | ORAL | 0 refills | Status: DC

## 2017-08-11 MED ORDER — CEPHALEXIN 500 MG PO CAPS: 500.0000 mg | ORAL_CAPSULE | Freq: Four times a day (QID) | ORAL | 0 refills | Status: DC

## 2017-08-11 NOTE — ED Provider Notes (Signed)
[facility] EMERGENCY DEPARTMENT Provider Note   CSN: [id number] Arrival date & time: 08/11/17  0930     History   Chief Complaint Chief Complaint  Patient presents with  . Recurrent Skin Infections    HPI [name] is a 67 y.o. female with a hx of T2DM, depression, asthma, and TIA who presents to the ED with concern for infection to the LLE today. Patient states she has a chronic LLE wound for which she sees the wound clinic and receives home health care. She has concerns this has become infected because she has felt subjectively warm with chills and generally not well, has not taken temperature. Denies alleviating/aggravating factors. Relays the LLE is consistently uncomfortable- no significant change in this. States she came to the ED last night for this- had blood work drawn- but was unable to stay as her husband did not feel well. She has BLE swelling that is chronic and unchanged. Denies nausea, vomiting, abdominal pain, numbness, or weakness.   HPI  Past Medical History:  Diagnosis Date  . Anxiety   . Arthritis    "back, arms, legs" (03/24/2016)  . Asthma   . Chronic lower back pain   . Colonic polyp    last colonoscopy done in 2009 with normal results per medical record  . Depressive disorder   . Gastric polyp   . Gout    has taken allopurinol 300mg  once daily in past  . Headache   . History of hiatal hernia   . Hypertension   . Migraine    "none in awhile; might have a couple/year" (03/24/2016)  . Mixed hyperlipidemia    01/2016 Total chol 141, HDL 59, LDL 63, ration 1.1  . Osteoarthritis   . TIA (transient ischemic attack) 11/2014  . Type II diabetes mellitus [facility])     Patient Active Problem List   Diagnosis Date Noted  . Diabetic foot ulcer associated with type 2 diabetes mellitus (HCC)   . Cellulitis 07/05/2016  . Depression with anxiety 05/30/2016  . 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 Generalized weakness  05/30/2016  . Left arm weakness 05/30/2016  . Left Lower extremity pain  04/02/2016  . Lower extremity pain, inferior, left 04/02/2016  . Left leg pain   . Personal history of noncompliance with medical treatment, presenting hazards to health 03/31/2016  . Jaw pain 03/27/2016  . Left leg cellulitis 03/24/2016  . Cellulitis of left lower extremity   . Primary insomnia   . Adjustment disorder   . Morbid obesity (HCC) 01/27/2016  . Wheelchair dependent 01/27/2016  . Gout 01/27/2016  . Asthma 01/27/2016  . Arthritis 01/27/2016  . Uncontrolled diabetes mellitus type 2 with peripheral artery disease (HCC) 01/25/2016  . Cellulitis of left leg 01/11/2016  . Insulin-requiring or dependent type II diabetes mellitus (HCC) 01/11/2016  . Hypertension 01/11/2016    Past Surgical History:  Procedure Laterality Date  . ARTERY BIOPSY Right 08/12/2016   Procedure: BIOPSY TEMPORAL ARTERY;  Surgeon: Sherren Kerns, MD;  Location: [facility] OR;  Service: Vascular;  Laterality: Right;  BIOPSY TEMPORAL ARTERY  . BREAST BIOPSY Left ~ 2015   benign  . CARPAL TUNNEL RELEASE Bilateral   . DILATION AND CURETTAGE OF UTERUS    . KNEE ARTHROSCOPY Right 2003   in [place]  . LAPAROSCOPIC CHOLECYSTECTOMY    . TUBAL LIGATION    . VAGINAL HYSTERECTOMY  1982    OB History    No  data available       Home Medications    Prior to Admission medications   Medication Sig Start Date End Date Taking? Authorizing Provider  acetaminophen (TYLENOL) 500 MG tablet Take 500 mg by mouth daily as needed for moderate pain.    [provider]  albuterol (PROVENTIL HFA;VENTOLIN HFA) 108 (90 Base) MCG/ACT inhaler Inhale 2 puffs into the lungs every 4 (four) hours as needed for wheezing or shortness of breath.     [provider]  allopurinol (ZYLOPRIM) 300 MG tablet Take 300 mg by mouth daily.    [provider]  aspirin 325 MG tablet Take 325 mg by mouth daily.    [provider]  atenolol  (TENORMIN) 25 MG tablet Take 25-50 mg by mouth 2 (two) times daily. Take 50 mg every morning and then 25 mg every evening    [provider]  Cholecalciferol (VITAMIN D3) 2000 units capsule Take 2,000 Units by mouth daily.    [provider]  cyclobenzaprine (FLEXERIL) 10 MG tablet Take 10 mg by mouth at bedtime as needed for muscle spasms.     [provider]  diclofenac sodium (VOLTAREN) 1 % GEL Apply 4 g topically 4 (four) times daily. Patient taking differently: Apply 4 g topically 4 (four) times daily as needed (pain).  02/25/16   Henson, [name] L, NP-C  furosemide (LASIX) 20 MG tablet Take 1 tablet (20 mg total) by mouth daily. 07/07/16   Leroy SeaSingh, Prashant K, MD  gabapentin (NEURONTIN) 300 MG capsule Take 1 capsule (300 mg total) by mouth 2 (two) times daily. Patient taking differently: Take 600 mg by mouth 2 (two) times daily.  01/31/16   Henson, [name] L, NP-C  hydrocerin (EUCERIN) CREA Apply 1 application topically daily. 01/15/16   Leroy SeaSingh, Prashant K, MD  HYDROcodone-acetaminophen (NORCO/VICODIN) 5-325 MG tablet Take 1-2 tablets by mouth every 6 (six) hours as needed. Patient taking differently: Take 1-2 tablets by mouth every 6 (six) hours as needed for severe pain.  02/26/17   Ward, Layla MawKristen N, DO  insulin degludec (TRESIBA FLEXTOUCH) 100 UNIT/ML SOPN FlexTouch Pen Inject 0.75 mLs (75 Units total) into the skin daily. Patient taking differently: Inject 75 Units into the skin every morning.  03/14/16   Henson, [name] L, NP-C  magnesium gluconate (MAGONATE) 500 MG tablet Take 500 mg by mouth daily.    [provider]  NIFEdipine (PROCARDIA XL/ADALAT-CC) 90 MG 24 hr tablet Take 90 mg by mouth daily.    [provider]  NOVOLOG FLEXPEN 100 UNIT/ML FlexPen Take 17 Units by mouth 2 (two) times daily. 01/30/17   [provider]  polyethylene glycol (MIRALAX / GLYCOLAX) packet Take 17 g by mouth daily as needed for moderate constipation.    [provider]  potassium chloride (K-DUR) 10 MEQ tablet Take 1 tablet (10 mEq total) by mouth daily. 07/07/16   Leroy SeaSingh, Prashant K, MD  protein supplement shake (PREMIER PROTEIN) LIQD Take 325 mLs (11 oz total) by mouth daily. 03/18/17   Rodolph Bonghompson, Daniel V, MD    Family History Family History  Problem Relation Age of Onset  . Stroke Father   . Stroke Brother   . Cancer Other     Social History Social History   Tobacco Use  . Smoking status: Never Smoker  . Smokeless tobacco: Never Used  Substance Use Topics  . Alcohol use: No  . Drug use: No     Allergies   Ace inhibitors; Tizanidine; Metformin  and related; and Propofol   Review of Systems Review of Systems  Constitutional: Positive for chills and fever (subjective).  Respiratory: Negative for shortness of breath.   Cardiovascular: Positive for leg swelling (BLE, unchanged). Negative for chest pain.  Gastrointestinal: Negative for abdominal pain and vomiting.  Skin: Positive for wound.  Neurological: Negative for weakness and numbness.  All other systems reviewed and are negative.  Physical Exam Updated Vital Signs BP 129/78   Pulse 77   Temp 98.5 F (36.9 C) (Oral)   Resp (!) 89   Ht 5\' 4"  (1.626 m)   Wt (!) 152 kg (335 lb)   SpO2 100%   BMI 57.50 kg/m   Physical Exam  Constitutional: She appears well-developed and well-nourished. No distress.  Obese  HENT:  Head: Normocephalic and atraumatic.  Eyes: Conjunctivae are normal. Right eye exhibits no discharge. Left eye exhibits no discharge.  Cardiovascular: Normal rate and regular rhythm.  No murmur heard. Pulses:      Dorsalis pedis pulses are 2+ on the right side, and 2+ on the left side.  Pulmonary/Chest: Breath sounds normal. No respiratory distress. She has no wheezes. She has no rales.  Abdominal: Soft. She exhibits no distension. There is no tenderness.  Musculoskeletal: She exhibits edema (2+ pitting bilaterally, extends to lower leg, does not  extend to the knee).  Neurological: She is alert.  Clear speech.   Skin: Skin is warm and dry. No rash noted.  Bilateral lower extremities with chronic venous stasis changes. There is a chronic is a chronic wound to the left lower extremity that is located on the dorsum of the fore/midfoot (7 cm x 8 cm) and the medial aspect of the ankle and lower leg (7cm x 9 cm). There are areas of thickened skin and erythematous areas of mild skin breakdown. The wound area is somewhat warm to touch. There is no surrounding/spreading erythema.   Wound pictured below.   Psychiatric: She has a normal mood and affect. Her behavior is normal.  Nursing note and vitals reviewed.            ED Treatments / Results  Labs Results for orders placed or performed during the hospital encounter of 08/11/17  CBG monitoring, ED  Result Value Ref Range   Glucose-Capillary 57 (L) 65 - 99 mg/dL  CBG monitoring, ED  Result Value Ref Range   Glucose-Capillary 119 (H) 65 - 99 mg/dL  EKG  EKG Interpretation None       Radiology Dg Ankle Complete Left  Result Date: 08/11/2017 CLINICAL DATA:  Open wound anteromedially EXAM: LEFT ANKLE COMPLETE - 3+ VIEW COMPARISON:  Left tibia and fibula March 16, 2017 FINDINGS: Frontal, oblique, and lateral views were obtained. There is generalized soft tissue swelling. No fracture or joint effusion. No erosive change or bony destruction evident. The ankle mortise appears intact. There are spurs arising from the posterior inferior calcaneus. There are multiple vascular phleboliths. No well-defined soft tissue air or radiopaque foreign body. IMPRESSION: Soft tissue swelling. No bony destruction. No fracture or effusion. Ankle mortise appears intact. There are calcaneal spurs. No appreciable joint space narrowing or erosion. Electronically Signed   By: Bretta Bang III M.D.   On: 08/11/2017 13:20   Dg Foot Complete Left  Result Date: 08/11/2017 CLINICAL DATA:  Open wound  antrum medially EXAM: LEFT FOOT - COMPLETE 3+ VIEW COMPARISON:  April 02, 2016 FINDINGS: Frontal, oblique, and lateral views obtained. There is generalized soft tissue swelling. There is  no evident fracture or dislocation. There is narrowing of all PIP and DIP joints. There is mild narrowing of the first MTP joint with mild hallux valgus deformity at the first MTP joint. No erosive change or bony destruction. No soft tissue air evident. IMPRESSION: Diffuse soft tissue edema. Osteoarthritic change in multiple distal joints. Mild hallux valgus deformity at the first MTP joint. No fracture or dislocation. No bony destruction. No soft tissue air evident. Electronically Signed   By: Bretta Bang III M.D.   On: 08/11/2017 13:21   Procedures Procedures (including critical care time)  Medications Ordered in ED Medications - No data to display   Initial Impression / Assessment and Plan / ED Course  I have reviewed the triage vital signs and the nursing notes.  Pertinent labs & imaging results that were available during my care of the patient were reviewed by me and considered in my medical decision making (see chart for details).   Patient presents with LLE chronic wound with concerns for infection. Patient is nontoxic appearing, in no apparent distress, vitals WNL other than initially hypertensive- all vitals normalized throughout visit. Patient's wound is pictured above, the area is warm to touch, there is no surrounding/spreading erythema. Patient has 2+ pitting edema bilaterally, there has been no change in her chronic swelling or pain, no recent surgery/trauma, recent long travel, hormone use, personal hx of cancer, or hx of DVT/PE, doubt DVT. Lab work reviewed from last night including CBC, CMP, and lactic acid- labs grossly unremarkable- of note no leukocytosis, lactic acid WNL, do not think repeat lab work is necessary at this time. Evaluated with X-rays for potential findings of osteomyelitis-  imaging negative for acute findings. At this time do not believe patient requires IV abx for cellulitis, will initiate outpatient therapy with doxycycline with wound care follow up and strict return precautions. I discussed results, treatment plan, need for wound care follow-up, and return precautions with the patient. Provided opportunity for questions, patient confirmed understanding and is in agreement with plan.    Findings and plan of care discussed with supervising physician Dr. Corlis Leak who personally evaluated and examined this patient and is in agreement with plan.   Vitals:   08/11/17 1400 08/11/17 1443  BP: 127/74   Pulse: 66   Resp:  18  Temp:  98.4 F (36.9 C)  SpO2: 97%     Final Clinical Impressions(s) / ED Diagnoses   Final diagnoses:  Cellulitis of left lower extremity    ED Discharge Orders        Ordered    cephALEXin (KEFLEX) 500 MG capsule  4 times daily,   Status:  Discontinued     08/11/17 1417    doxycycline (VIBRAMYCIN) 100 MG capsule  2 times daily     08/11/17 [address], [place] R, PA-C 08/11/17 1511    Abelino Derrick, MD 08/13/17 [phone]

## 2017-08-11 NOTE — ED Triage Notes (Addendum)
Pt reports bilateral cellulitis. She states they did blood work to see if infected and dressing needs to be changed- disposable diaper applied. She states home health nurse called today to come see but told her in ED and has appt tomm with wound care. She was here last night but left due to long waits. CBG 58; husband gave her candy bar to eat. Lactic and WBC from last night normal.

## 2017-08-11 NOTE — Discharge Instructions (Signed)
You were seen in the emergency department today due to concern for infection of your left leg.  Your lab work from last night was reviewed, there was no significant elevation in your white blood cell count or your lactic acid level-these are things that we used to check for signs of infection.  The x-rays we did did not show signs of infection spreading to the bone.  We will start you on an antibiotic, doxycycline, you will need to take this two times per day for the next 10 days. Please take all of your antibiotics until finished. You may develop abdominal discomfort or diarrhea from the antibiotic.  You may help offset this with probiotics which you can buy at the store (ask your pharmacist if unable to find) or get probiotics in the form of eating yogurt. Do not eat or take the probiotics until 2 hours after your antibiotic. If you are unable to tolerate these side effects follow-up with your primary care provider or return to the emergency department.   If you begin to experience any blistering, rashes, swelling, or difficulty breathing seek medical care for evaluation of potentially more serious side effects.   Please be aware that this medication may interact with other medications you are taking, please be sure to discuss your medication list with your pharmacist.    Follow-up with the wound care clinic tomorrow as scheduled.  Return to the emergency department immediately for any new or worsening symptoms including but not limited to spreading redness, discharge from your wounds, fever, chills, nausea, vomiting, or any other concerns you may have.

## 2017-08-11 NOTE — ED Notes (Signed)
Patient transported to X-ray 

## 2017-08-12 DIAGNOSIS — E11622 Type 2 diabetes mellitus with other skin ulcer: Secondary | ICD-10-CM | POA: Diagnosis not present

## 2017-08-19 ENCOUNTER — Encounter (HOSPITAL_BASED_OUTPATIENT_CLINIC_OR_DEPARTMENT_OTHER): Payer: Medicare PPO | Attending: Physician Assistant

## 2017-08-19 DIAGNOSIS — E11622 Type 2 diabetes mellitus with other skin ulcer: Secondary | ICD-10-CM | POA: Insufficient documentation

## 2017-08-19 DIAGNOSIS — Z6841 Body Mass Index (BMI) 40.0 and over, adult: Secondary | ICD-10-CM | POA: Insufficient documentation

## 2017-08-19 DIAGNOSIS — L97522 Non-pressure chronic ulcer of other part of left foot with fat layer exposed: Secondary | ICD-10-CM | POA: Diagnosis not present

## 2017-08-19 DIAGNOSIS — I890 Lymphedema, not elsewhere classified: Secondary | ICD-10-CM | POA: Insufficient documentation

## 2017-08-19 DIAGNOSIS — L97212 Non-pressure chronic ulcer of right calf with fat layer exposed: Secondary | ICD-10-CM | POA: Diagnosis not present

## 2017-08-19 DIAGNOSIS — I10 Essential (primary) hypertension: Secondary | ICD-10-CM | POA: Diagnosis not present

## 2017-08-19 DIAGNOSIS — E11621 Type 2 diabetes mellitus with foot ulcer: Secondary | ICD-10-CM | POA: Insufficient documentation

## 2017-08-19 DIAGNOSIS — Z993 Dependence on wheelchair: Secondary | ICD-10-CM | POA: Insufficient documentation

## 2017-08-26 DIAGNOSIS — E11622 Type 2 diabetes mellitus with other skin ulcer: Secondary | ICD-10-CM | POA: Diagnosis not present

## 2017-09-03 DIAGNOSIS — E11622 Type 2 diabetes mellitus with other skin ulcer: Secondary | ICD-10-CM | POA: Diagnosis not present

## 2017-09-08 DIAGNOSIS — I10 Essential (primary) hypertension: Secondary | ICD-10-CM | POA: Insufficient documentation

## 2017-09-08 DIAGNOSIS — Z794 Long term (current) use of insulin: Secondary | ICD-10-CM | POA: Diagnosis not present

## 2017-09-08 DIAGNOSIS — Z7982 Long term (current) use of aspirin: Secondary | ICD-10-CM | POA: Insufficient documentation

## 2017-09-08 DIAGNOSIS — Z79899 Other long term (current) drug therapy: Secondary | ICD-10-CM | POA: Diagnosis not present

## 2017-09-08 DIAGNOSIS — R0789 Other chest pain: Secondary | ICD-10-CM | POA: Insufficient documentation

## 2017-09-08 DIAGNOSIS — J45909 Unspecified asthma, uncomplicated: Secondary | ICD-10-CM | POA: Diagnosis not present

## 2017-09-08 DIAGNOSIS — E1151 Type 2 diabetes mellitus with diabetic peripheral angiopathy without gangrene: Secondary | ICD-10-CM | POA: Insufficient documentation

## 2017-09-08 DIAGNOSIS — R079 Chest pain, unspecified: Secondary | ICD-10-CM | POA: Diagnosis present

## 2017-09-09 ENCOUNTER — Other Ambulatory Visit: Payer: Self-pay

## 2017-09-09 ENCOUNTER — Emergency Department (HOSPITAL_BASED_OUTPATIENT_CLINIC_OR_DEPARTMENT_OTHER)
Admission: EM | Admit: 2017-09-09 | Discharge: 2017-09-09 | Disposition: A | Discharge status: Home / Self Care | Payer: Medicare PPO | Attending: Emergency Medicine | Admitting: Emergency Medicine

## 2017-09-09 ENCOUNTER — Encounter (HOSPITAL_BASED_OUTPATIENT_CLINIC_OR_DEPARTMENT_OTHER): Payer: Self-pay | Admitting: *Deleted

## 2017-09-09 ENCOUNTER — Emergency Department (HOSPITAL_BASED_OUTPATIENT_CLINIC_OR_DEPARTMENT_OTHER): Payer: Medicare PPO

## 2017-09-09 DIAGNOSIS — R0789 Other chest pain: Secondary | ICD-10-CM

## 2017-09-09 LAB — CBC WITH DIFFERENTIAL/PLATELET
Basophils Absolute: 0 K/uL (ref 0.0–0.1)
Basophils Relative: 0 %
Eosinophils Absolute: 0.2 K/uL (ref 0.0–0.7)
Eosinophils Relative: 2 %
HCT: 40.1 % (ref 36.0–46.0)
Hemoglobin: 13 g/dL (ref 12.0–15.0)
Lymphocytes Relative: 19 %
Lymphs Abs: 1.7 K/uL (ref 0.7–4.0)
MCH: 28.8 pg (ref 26.0–34.0)
MCHC: 32.4 g/dL (ref 30.0–36.0)
MCV: 88.9 fL (ref 78.0–100.0)
Monocytes Absolute: 0.7 K/uL (ref 0.1–1.0)
Monocytes Relative: 7 %
Neutro Abs: 6.5 K/uL (ref 1.7–7.7)
Neutrophils Relative %: 72 %
Platelets: 330 K/uL (ref 150–400)
RBC: 4.51 MIL/uL (ref 3.87–5.11)
RDW: 16.5 % — ABNORMAL HIGH (ref 11.5–15.5)
WBC: 9.1 K/uL (ref 4.0–10.5)

## 2017-09-09 LAB — BASIC METABOLIC PANEL
Anion gap: 8 (ref 5–15)
BUN: 11 mg/dL (ref 6–20)
CO2: 24 mmol/L (ref 22–32)
Calcium: 9.3 mg/dL (ref 8.9–10.3)
Chloride: 104 mmol/L (ref 101–111)
Creatinine, Ser: 0.89 mg/dL (ref 0.44–1.00)
GFR calc Af Amer: >60 mL/min (ref >60)
GFR calc non Af Amer: >60 mL/min (ref >60)
Glucose, Bld: 91 mg/dL (ref 65–99)
Potassium: 3.7 mmol/L (ref 3.5–5.1)
Sodium: 136 mmol/L (ref 135–145)

## 2017-09-09 LAB — TROPONIN I
Troponin I: <0.03 ng/mL (ref <0.03)
Troponin I: <0.03 ng/mL (ref <0.03)

## 2017-09-09 MED ORDER — DICLOFENAC SODIUM ER 100 MG PO TB24: 100.0000 mg | ORAL_TABLET | Freq: Every day | ORAL | 0 refills | Status: DC

## 2017-09-09 MED ORDER — GI COCKTAIL ~~LOC~~
30.0000 mL | Freq: Once | ORAL | Status: AC
  Administered 2017-09-09: 30 mL via ORAL
  Filled 2017-09-09: qty 30

## 2017-09-09 MED ORDER — KETOROLAC TROMETHAMINE 30 MG/ML IJ SOLN
15.0000 mg | Freq: Once | INTRAMUSCULAR | Status: AC
  Administered 2017-09-09: 15 mg via INTRAVENOUS
  Filled 2017-09-09: qty 1

## 2017-09-09 NOTE — ED Triage Notes (Signed)
Pt reports ongoing issue with inability to move right arm. Pt has had a full workup with multiple xrays and blood work done that was inclusive. Pt reports that she started having substernal CP-point tender on palpation, reproducible with movement and palpation x 3 days ago. Denies N/V/SOB.

## 2017-09-09 NOTE — ED Provider Notes (Signed)
[facility] EMERGENCY DEPARTMENT Provider Note   CSN: [id number] Arrival date & time: 09/08/17  2356     History   Chief Complaint Chief Complaint  Patient presents with  . Chest Pain    HPI [name] is a 67 y.o. female.  The history is provided by the patient. No language interpreter was used.  Chest Pain   This is a chronic problem. The current episode started more than 1 week ago (3 months constant). The problem occurs constantly. The problem has not changed since onset.The pain is associated with raising an arm. The pain is present in the substernal region. The pain is moderate. Quality: squeezing. The pain does not radiate. The symptoms are aggravated by certain positions. Pertinent negatives include no abdominal pain, no back pain, no claudication, no cough, no diaphoresis, no dizziness, no exertional chest pressure, no fever, no headaches, no hemoptysis, no irregular heartbeat, no leg pain, no lower extremity edema, no malaise/fatigue, no nausea, no near-syncope, no numbness, no orthopnea, no palpitations, no PND, no shortness of breath, no sputum production, no syncope, no vomiting and no weakness. She has tried nothing for the symptoms. The treatment provided no relief. Risk factors include obesity.  Pertinent negatives for past medical history include no Marfan's syndrome.  Pertinent negatives for family medical history include: no TIA.  Procedure history is negative for EPS study.    Past Medical History:  Diagnosis Date  . Anxiety   . Arthritis    "back, arms, legs" (03/24/2016)  . Asthma   . Chronic lower back pain   . Colonic polyp    last colonoscopy done in 2009 with normal results per medical record  . Depressive disorder   . Gastric polyp   . Gout    has taken allopurinol 369m once daily in past  . Headache   . History of hiatal hernia   . Hypertension   . Migraine    "none in awhile; might have a couple/year" (03/24/2016)  . Mixed  hyperlipidemia    01/2016 Total chol 141, HDL 59, LDL 63, ration 1.1  . Osteoarthritis   . TIA (transient ischemic attack) 11/2014  . Type II diabetes mellitus ([facility]     Patient Active Problem List   Diagnosis Date Noted  . Diabetic foot ulcer associated with type 2 diabetes mellitus ([facility]   . Cellulitis 07/05/2016  . Depression with anxiety 05/30/2016  . Chronic skin ulcer of lower leg ([facility] 05/30/2016  . Generalized weakness 05/30/2016  . Left arm weakness 05/30/2016  . Left Lower extremity pain  04/02/2016  . Lower extremity pain, inferior, left 04/02/2016  . Left leg pain   . Personal history of noncompliance with medical treatment, presenting hazards to health 03/31/2016  . Jaw pain 03/27/2016  . Left leg cellulitis 03/24/2016  . Cellulitis of left lower extremity   . Primary insomnia   . Adjustment disorder   . Morbid obesity ([facility] 01/27/2016  . Wheelchair dependent 01/27/2016  . Gout 01/27/2016  . Asthma 01/27/2016  . Arthritis 01/27/2016  . Uncontrolled diabetes mellitus type 2 with peripheral artery disease ([facility] 01/25/2016  . Cellulitis of left leg 01/11/2016  . Insulin-requiring or dependent type II diabetes mellitus ([facility] 01/11/2016  . Hypertension 01/11/2016    Past Surgical History:  Procedure Laterality Date  . ARTERY BIOPSY Right 08/12/2016   Procedure: BIOPSY TEMPORAL ARTERY;  Surgeon: CElam [name] MD;  [place]  Service: [place];  Laterality: Right;  BIOPSY TEMPORAL  ARTERY  . BREAST BIOPSY Left ~ 2015   benign  . CARPAL TUNNEL RELEASE Bilateral   . DILATION AND CURETTAGE OF UTERUS    . KNEE ARTHROSCOPY Right 2003   in [place]  . LAPAROSCOPIC CHOLECYSTECTOMY    . TUBAL LIGATION    . VAGINAL HYSTERECTOMY  1982     OB History   None      Home Medications    Prior to Admission medications   Medication Sig Start Date End Date Taking? Authorizing Provider  ACCU-CHEK FASTCLIX LANCETS MISC 1 each as needed. 03/20/17   [provider]  ACCU-CHEK SMARTVIEW test strip 1 each 4 (four) times daily. for testing 06/15/17   [provider]  acetaminophen (TYLENOL) 500 MG tablet Take 500 mg by mouth daily as needed for moderate pain.    [provider]  albuterol (PROVENTIL HFA;VENTOLIN HFA) 108 (90 Base) MCG/ACT inhaler Inhale 2 puffs into the lungs every 4 (four) hours as needed for wheezing or shortness of breath.     [provider]  allopurinol (ZYLOPRIM) 300 MG tablet Take 300 mg by mouth daily.    [provider]  aspirin 325 MG tablet Take 325 mg by mouth daily.    [provider]  atenolol (TENORMIN) 25 MG tablet Take 25-50 mg by mouth 2 (two) times daily. Take 50 mg every morning and then 25 mg every evening    [provider]  Blood Glucose Monitoring Suppl (ACCU-CHEK NANO SMARTVIEW) w/Device KIT 1 each See admin instructions. 05/23/17   [provider]  Cholecalciferol (VITAMIN D3) 2000 units capsule Take 2,000 Units by mouth daily.    [provider]  cyclobenzaprine (FLEXERIL) 10 MG tablet Take 10 mg by mouth at bedtime as needed for muscle spasms.     [provider]  diclofenac sodium (VOLTAREN) 1 % GEL Apply 4 g topically 4 (four) times daily. Patient taking differently: Apply 4 g topically 4 (four) times daily as needed (pain).  02/25/16   Henson, [name] L, NP-C  doxycycline (VIBRAMYCIN) 100 MG capsule Take 1 capsule (100 mg total) by mouth 2 (two) times daily. 08/11/17   Petrucelli, Samantha R, PA-C  furosemide (LASIX) 20 MG tablet Take 1 tablet (20 mg total) by mouth daily. 07/07/16   Thurnell Lose, MD  gabapentin (NEURONTIN) 300 MG capsule Take 1 capsule (300 mg total) by mouth 2 (two) times daily. Patient taking differently: Take 600 mg by mouth 2 (two) times daily.  01/31/16   Henson, [name] L, NP-C  hydrocerin (EUCERIN) CREA Apply 1 application topically daily. 01/15/16   Thurnell Lose, MD  HYDROcodone-acetaminophen  (NORCO/VICODIN) 5-325 MG tablet Take 1-2 tablets by mouth every 6 (six) hours as needed. Patient not taking: Reported on 08/11/2017 02/26/17   Ward, Delice Bison, DO  insulin degludec (TRESIBA FLEXTOUCH) 100 UNIT/ML SOPN FlexTouch Pen Inject 0.75 mLs (75 Units total) into the skin daily. Patient taking differently: Inject 75 Units into the skin every morning.  03/14/16   Henson, [name] L, NP-C  magnesium gluconate (MAGONATE) 500 MG tablet Take 500 mg by mouth daily.    [provider]  NIFEdipine (PROCARDIA XL/ADALAT-CC) 90 MG 24 hr tablet Take 90 mg by mouth daily.    [provider]  NOVOFINE 32G X 6 MM MISC 1 each as needed. 08/09/17   [provider]  NOVOLOG FLEXPEN 100 UNIT/ML FlexPen Take 17 Units by mouth 2 (two) times daily. 01/30/17   [provider]  polyethylene glycol (MIRALAX / GLYCOLAX) packet Take 17 g by mouth daily as needed for moderate constipation.    [provider]  potassium chloride (K-DUR) 10 MEQ tablet Take 1 tablet (10 mEq total) by mouth daily. 07/07/16   Thurnell Lose, MD  protein supplement shake (PREMIER PROTEIN) LIQD Take 325 mLs (11 oz total) by mouth daily. 03/18/17   Eugenie Filler, MD    Family History Family History  Problem Relation Age of Onset  . Stroke Father   . Stroke Brother   . Cancer Other     Social History Social History   Tobacco Use  . Smoking status: Never Smoker  . Smokeless tobacco: Never Used  Substance Use Topics  . Alcohol use: No  . Drug use: No     Allergies   Ace inhibitors; Tizanidine; Metformin and related; and Propofol   Review of Systems Review of Systems  Constitutional: Negative for diaphoresis, fever and malaise/fatigue.  Respiratory: Negative for cough, hemoptysis, sputum production and shortness of breath.   Cardiovascular: Positive for chest pain. Negative for palpitations, orthopnea, claudication, leg swelling, syncope, PND and near-syncope.  Gastrointestinal:  Negative for abdominal pain, nausea and vomiting.  Musculoskeletal: Negative for back pain.  Neurological: Negative for dizziness, weakness, numbness and headaches.  All other systems reviewed and are negative.    Physical Exam Updated Vital Signs BP 126/80   Pulse 74   Temp 99.4 F (37.4 C) (Oral)   Resp 15   Ht '5\' 4"'$  (1.626 m)   Wt (!) 152 kg (335 lb)   SpO2 95%   BMI 57.50 kg/m   Physical Exam  Constitutional: She is oriented to person, place, and time. She appears well-developed and well-nourished. No distress.  HENT:  Head: Normocephalic and atraumatic.  Mouth/Throat: No oropharyngeal exudate.  Eyes: Pupils are equal, round, and reactive to light. Conjunctivae are normal.  Neck: Normal range of motion. Neck supple.  Cardiovascular: Normal rate, regular rhythm, normal heart sounds and intact distal pulses.  Pulmonary/Chest: Effort normal and breath sounds normal. No stridor. She has no wheezes. She has no rales.  Abdominal: Soft. Bowel sounds are normal. She exhibits no mass. There is no tenderness. There is no rebound and no guarding.  Musculoskeletal: Normal range of motion.  Neurological: She is alert and oriented to person, place, and time.  Skin: Skin is warm and dry. Capillary refill takes less than 2 seconds.  Psychiatric: She has a normal mood and affect.     ED Treatments / Results  Labs (all labs ordered are listed, but only abnormal results are displayed) Results for orders placed or performed during the hospital encounter of 09/09/17  CBC with Differential/Platelet  Result Value Ref Range   WBC 9.1 4.0 - 10.5 K/uL   RBC 4.51 3.87 - 5.11 MIL/uL   Hemoglobin 13.0 12.0 - 15.0 g/dL   HCT 40.1 36.0 - 46.0 %   MCV 88.9 78.0 - 100.0 fL   MCH 28.8 26.0 - 34.0 pg   MCHC 32.4 30.0 - 36.0 g/dL   RDW 16.5 (H) 11.5 - 15.5 %   Platelets 330 150 - 400 K/uL   Neutrophils Relative % 72 %   Neutro Abs 6.5 1.7 - 7.7 K/uL   Lymphocytes Relative 19 %   Lymphs Abs 1.7  0.7 - 4.0 K/uL   Monocytes Relative 7 %   Monocytes Absolute 0.7 0.1 - 1.0 K/uL   Eosinophils Relative 2 %   Eosinophils Absolute 0.2 0.0 -  0.7 K/uL   Basophils Relative 0 %   Basophils Absolute 0.0 0.0 - 0.1 K/uL  Basic metabolic panel  Result Value Ref Range   Sodium 136 135 - 145 mmol/L   Potassium 3.7 3.5 - 5.1 mmol/L   Chloride 104 101 - 111 mmol/L   CO2 24 22 - 32 mmol/L   Glucose, Bld 91 65 - 99 mg/dL   BUN 11 6 - 20 mg/dL   Creatinine, Ser 0.89 0.44 - 1.00 mg/dL   Calcium 9.3 8.9 - 10.3 mg/dL   GFR calc non Af Amer >60 >60 mL/min   GFR calc Af Amer >60 >60 mL/min   Anion gap 8 5 - 15  Troponin I  Result Value Ref Range   Troponin I <0.03 <0.03 ng/mL   Dg Chest 2 View  Result Date: 09/09/2017 CLINICAL DATA:  67 year old female with intermittent substernal chest pain for 3 days with shortness of breath. EXAM: CHEST - 2 VIEW COMPARISON:  Chest radiographs 05/30/2016. FINDINGS: Large body habitus. Lower lung volumes. Stable cardiomegaly and mediastinal contours. Visualized tracheal air column is within normal limits. No pneumothorax. No layering pleural effusion identified. Pulmonary vascularity appears similar to the 2017 study without overt edema. No consolidation. No acute osseous abnormality identified. Paucity of bowel gas in the upper abdomen. IMPRESSION: Chronic cardiomegaly and pulmonary vascular congestion appears stable since 2017 without overt edema or definite pleural effusion. Electronically Signed   By: Genevie Ann M.D.   On: 09/09/2017 01:52   Dg Ankle Complete Left  Result Date: 08/11/2017 CLINICAL DATA:  Open wound anteromedially EXAM: LEFT ANKLE COMPLETE - 3+ VIEW COMPARISON:  Left tibia and fibula March 16, 2017 FINDINGS: Frontal, oblique, and lateral views were obtained. There is generalized soft tissue swelling. No fracture or joint effusion. No erosive change or bony destruction evident. The ankle mortise appears intact. There are spurs arising from the posterior  inferior calcaneus. There are multiple vascular phleboliths. No well-defined soft tissue air or radiopaque foreign body. IMPRESSION: Soft tissue swelling. No bony destruction. No fracture or effusion. Ankle mortise appears intact. There are calcaneal spurs. No appreciable joint space narrowing or erosion. Electronically Signed   By: Lowella Grip III M.D.   On: 08/11/2017 13:20   Dg Foot Complete Left  Result Date: 08/11/2017 CLINICAL DATA:  Open wound antrum medially EXAM: LEFT FOOT - COMPLETE 3+ VIEW COMPARISON:  April 02, 2016 FINDINGS: Frontal, oblique, and lateral views obtained. There is generalized soft tissue swelling. There is no evident fracture or dislocation. There is narrowing of all PIP and DIP joints. There is mild narrowing of the first MTP joint with mild hallux valgus deformity at the first MTP joint. No erosive change or bony destruction. No soft tissue air evident. IMPRESSION: Diffuse soft tissue edema. Osteoarthritic change in multiple distal joints. Mild hallux valgus deformity at the first MTP joint. No fracture or dislocation. No bony destruction. No soft tissue air evident. Electronically Signed   By: Lowella Grip III M.D.   On: 08/11/2017 13:21    EKG EKG Interpretation  Date/Time:  Wednesday September 09 2017 02:16:10 EDT Ventricular Rate:  76 PR Interval:  178 QRS Duration: 86 QT Interval:  389 QTC Calculation: 438 R Axis:   10 Text Interpretation:  Sinus rhythm Confirmed by Dory Horn) on 09/09/2017 3:43:39 AM   Radiology Dg Chest 2 View  Result Date: 09/09/2017 CLINICAL DATA:  67 year old female with intermittent substernal chest pain for 3 days with shortness of breath. EXAM:  CHEST - 2 VIEW COMPARISON:  Chest radiographs 05/30/2016. FINDINGS: Large body habitus. Lower lung volumes. Stable cardiomegaly and mediastinal contours. Visualized tracheal air column is within normal limits. No pneumothorax. No layering pleural effusion identified.  Pulmonary vascularity appears similar to the 2017 study without overt edema. No consolidation. No acute osseous abnormality identified. Paucity of bowel gas in the upper abdomen. IMPRESSION: Chronic cardiomegaly and pulmonary vascular congestion appears stable since 2017 without overt edema or definite pleural effusion. Electronically Signed   By: Genevie Ann M.D.   On: 09/09/2017 01:52    Procedures Procedures (including critical care time)  Medications Ordered in ED Medications  ketorolac (TORADOL) 30 MG/ML injection 15 mg (15 mg Intravenous Given 09/09/17 0217)  gi cocktail (Maalox,Lidocaine,Donnatal) (30 mLs Oral Given 09/09/17 0217)       Final Clinical Impressions(s) / ED Diagnoses  Ruled out for MI  Symptoms reproducible, highly atypical and chronic will treat for MSK pain.     Return for weakness, numbness, changes in vision or speech, fevers >100.4 unrelieved by medication, shortness of breath, intractable vomiting, or diarrhea, abdominal pain, Inability to tolerate liquids or food, cough, altered mental status or any concerns. No signs of systemic illness or infection. The patient is nontoxic-appearing on exam and vital signs are within normal limits.   I have reviewed the triage vital signs and the nursing notes. Pertinent labs &imaging results that were available during my care of the patient were reviewed by me and considered in my medical decision making (see chart for details).  After history, exam, and medical workup I feel the patient has been appropriately medically screened and is safe for discharge home. Pertinent diagnoses were discussed with the patient. Patient was given return precautions.     Kendrik Mcshan, MD 09/09/17 [id number]

## 2017-09-10 DIAGNOSIS — E11622 Type 2 diabetes mellitus with other skin ulcer: Secondary | ICD-10-CM | POA: Diagnosis not present

## 2017-09-16 ENCOUNTER — Encounter (HOSPITAL_BASED_OUTPATIENT_CLINIC_OR_DEPARTMENT_OTHER): Payer: Medicare PPO | Attending: Physician Assistant

## 2017-09-16 DIAGNOSIS — L97322 Non-pressure chronic ulcer of left ankle with fat layer exposed: Secondary | ICD-10-CM | POA: Diagnosis not present

## 2017-09-16 DIAGNOSIS — I87332 Chronic venous hypertension (idiopathic) with ulcer and inflammation of left lower extremity: Secondary | ICD-10-CM | POA: Diagnosis not present

## 2017-09-16 DIAGNOSIS — E11622 Type 2 diabetes mellitus with other skin ulcer: Secondary | ICD-10-CM | POA: Diagnosis present

## 2017-09-16 DIAGNOSIS — R531 Weakness: Secondary | ICD-10-CM | POA: Diagnosis not present

## 2017-09-16 DIAGNOSIS — Z993 Dependence on wheelchair: Secondary | ICD-10-CM | POA: Insufficient documentation

## 2017-09-16 DIAGNOSIS — I890 Lymphedema, not elsewhere classified: Secondary | ICD-10-CM | POA: Diagnosis not present

## 2017-09-16 DIAGNOSIS — I10 Essential (primary) hypertension: Secondary | ICD-10-CM | POA: Diagnosis not present

## 2017-09-16 DIAGNOSIS — F418 Other specified anxiety disorders: Secondary | ICD-10-CM | POA: Diagnosis not present

## 2017-09-16 DIAGNOSIS — L97522 Non-pressure chronic ulcer of other part of left foot with fat layer exposed: Secondary | ICD-10-CM | POA: Diagnosis not present

## 2017-09-16 DIAGNOSIS — L97829 Non-pressure chronic ulcer of other part of left lower leg with unspecified severity: Secondary | ICD-10-CM | POA: Diagnosis not present

## 2017-09-16 DIAGNOSIS — Z6841 Body Mass Index (BMI) 40.0 and over, adult: Secondary | ICD-10-CM | POA: Diagnosis not present

## 2017-09-30 DIAGNOSIS — E11622 Type 2 diabetes mellitus with other skin ulcer: Secondary | ICD-10-CM | POA: Diagnosis not present

## 2017-10-14 ENCOUNTER — Encounter (HOSPITAL_BASED_OUTPATIENT_CLINIC_OR_DEPARTMENT_OTHER): Payer: Medicare PPO | Attending: Physician Assistant

## 2017-10-14 DIAGNOSIS — I87332 Chronic venous hypertension (idiopathic) with ulcer and inflammation of left lower extremity: Secondary | ICD-10-CM | POA: Insufficient documentation

## 2017-10-14 DIAGNOSIS — L97822 Non-pressure chronic ulcer of other part of left lower leg with fat layer exposed: Secondary | ICD-10-CM | POA: Insufficient documentation

## 2017-10-14 DIAGNOSIS — E11622 Type 2 diabetes mellitus with other skin ulcer: Secondary | ICD-10-CM | POA: Diagnosis not present

## 2017-10-14 DIAGNOSIS — L97529 Non-pressure chronic ulcer of other part of left foot with unspecified severity: Secondary | ICD-10-CM | POA: Diagnosis not present

## 2017-10-14 DIAGNOSIS — Z6841 Body Mass Index (BMI) 40.0 and over, adult: Secondary | ICD-10-CM | POA: Diagnosis not present

## 2017-10-14 DIAGNOSIS — F418 Other specified anxiety disorders: Secondary | ICD-10-CM | POA: Diagnosis not present

## 2017-10-14 DIAGNOSIS — I10 Essential (primary) hypertension: Secondary | ICD-10-CM | POA: Insufficient documentation

## 2017-10-14 DIAGNOSIS — R531 Weakness: Secondary | ICD-10-CM | POA: Insufficient documentation

## 2017-10-14 DIAGNOSIS — L97329 Non-pressure chronic ulcer of left ankle with unspecified severity: Secondary | ICD-10-CM | POA: Insufficient documentation

## 2017-10-14 DIAGNOSIS — I890 Lymphedema, not elsewhere classified: Secondary | ICD-10-CM | POA: Diagnosis not present

## 2017-10-28 DIAGNOSIS — E11622 Type 2 diabetes mellitus with other skin ulcer: Secondary | ICD-10-CM | POA: Diagnosis not present

## 2017-11-11 DIAGNOSIS — E11622 Type 2 diabetes mellitus with other skin ulcer: Secondary | ICD-10-CM | POA: Diagnosis not present

## 2017-11-25 ENCOUNTER — Encounter (HOSPITAL_BASED_OUTPATIENT_CLINIC_OR_DEPARTMENT_OTHER): Payer: Medicare PPO | Attending: Internal Medicine

## 2017-11-25 DIAGNOSIS — L97529 Non-pressure chronic ulcer of other part of left foot with unspecified severity: Secondary | ICD-10-CM | POA: Insufficient documentation

## 2017-11-25 DIAGNOSIS — I890 Lymphedema, not elsewhere classified: Secondary | ICD-10-CM | POA: Insufficient documentation

## 2017-11-25 DIAGNOSIS — I10 Essential (primary) hypertension: Secondary | ICD-10-CM | POA: Insufficient documentation

## 2017-11-25 DIAGNOSIS — Z6841 Body Mass Index (BMI) 40.0 and over, adult: Secondary | ICD-10-CM | POA: Diagnosis not present

## 2017-11-25 DIAGNOSIS — E11622 Type 2 diabetes mellitus with other skin ulcer: Secondary | ICD-10-CM | POA: Insufficient documentation

## 2017-11-25 DIAGNOSIS — L97329 Non-pressure chronic ulcer of left ankle with unspecified severity: Secondary | ICD-10-CM | POA: Insufficient documentation

## 2017-11-25 DIAGNOSIS — Z794 Long term (current) use of insulin: Secondary | ICD-10-CM | POA: Insufficient documentation

## 2017-11-25 DIAGNOSIS — E11621 Type 2 diabetes mellitus with foot ulcer: Secondary | ICD-10-CM | POA: Diagnosis not present

## 2017-11-25 DIAGNOSIS — E6609 Other obesity due to excess calories: Secondary | ICD-10-CM | POA: Insufficient documentation

## 2017-12-09 DIAGNOSIS — E11622 Type 2 diabetes mellitus with other skin ulcer: Secondary | ICD-10-CM | POA: Diagnosis not present

## 2017-12-23 ENCOUNTER — Encounter (HOSPITAL_BASED_OUTPATIENT_CLINIC_OR_DEPARTMENT_OTHER): Payer: Medicare PPO | Attending: Physician Assistant

## 2017-12-23 DIAGNOSIS — I890 Lymphedema, not elsewhere classified: Secondary | ICD-10-CM | POA: Diagnosis not present

## 2017-12-23 DIAGNOSIS — L97522 Non-pressure chronic ulcer of other part of left foot with fat layer exposed: Secondary | ICD-10-CM | POA: Insufficient documentation

## 2017-12-23 DIAGNOSIS — I10 Essential (primary) hypertension: Secondary | ICD-10-CM | POA: Diagnosis not present

## 2017-12-23 DIAGNOSIS — E11622 Type 2 diabetes mellitus with other skin ulcer: Secondary | ICD-10-CM | POA: Diagnosis present

## 2017-12-23 DIAGNOSIS — F418 Other specified anxiety disorders: Secondary | ICD-10-CM | POA: Diagnosis not present

## 2017-12-23 DIAGNOSIS — L97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 leg with fat layer exposed: Secondary | ICD-10-CM | POA: Diagnosis not present

## 2017-12-23 DIAGNOSIS — R531 Weakness: Secondary | ICD-10-CM | POA: Diagnosis not present

## 2017-12-23 DIAGNOSIS — L97322 Non-pressure chronic ulcer of left ankle with fat layer exposed: Secondary | ICD-10-CM | POA: Insufficient documentation

## 2017-12-23 DIAGNOSIS — Z6841 Body Mass Index (BMI) 40.0 and over, adult: Secondary | ICD-10-CM | POA: Insufficient documentation

## 2017-12-23 DIAGNOSIS — L97822 Non-pressure chronic ulcer of other part of left lower leg with fat layer exposed: Secondary | ICD-10-CM | POA: Diagnosis not present

## 2017-12-23 DIAGNOSIS — I87333 Chronic venous hypertension (idiopathic) with ulcer and inflammation of bilateral lower extremity: Secondary | ICD-10-CM | POA: Insufficient documentation

## 2017-12-23 DIAGNOSIS — Z794 Long term (current) use of insulin: Secondary | ICD-10-CM | POA: Diagnosis not present

## 2017-12-30 DIAGNOSIS — E11622 Type 2 diabetes mellitus with other skin ulcer: Secondary | ICD-10-CM | POA: Diagnosis not present

## 2018-01-06 DIAGNOSIS — E11622 Type 2 diabetes mellitus with other skin ulcer: Secondary | ICD-10-CM | POA: Diagnosis not present

## 2018-01-13 DIAGNOSIS — E11622 Type 2 diabetes mellitus with other skin ulcer: Secondary | ICD-10-CM | POA: Diagnosis not present

## 2018-01-20 ENCOUNTER — Encounter (HOSPITAL_BASED_OUTPATIENT_CLINIC_OR_DEPARTMENT_OTHER): Payer: Medicare PPO | Attending: Physician Assistant

## 2018-01-20 DIAGNOSIS — Z6841 Body Mass Index (BMI) 40.0 and over, adult: Secondary | ICD-10-CM | POA: Diagnosis not present

## 2018-01-20 DIAGNOSIS — I10 Essential (primary) hypertension: Secondary | ICD-10-CM | POA: Diagnosis not present

## 2018-01-20 DIAGNOSIS — E11621 Type 2 diabetes mellitus with foot ulcer: Secondary | ICD-10-CM | POA: Insufficient documentation

## 2018-01-20 DIAGNOSIS — E11622 Type 2 diabetes mellitus with other skin ulcer: Secondary | ICD-10-CM | POA: Insufficient documentation

## 2018-01-20 DIAGNOSIS — Z794 Long term (current) use of insulin: Secondary | ICD-10-CM | POA: Diagnosis not present

## 2018-01-20 DIAGNOSIS — L97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 leg with fat layer exposed: Secondary | ICD-10-CM | POA: Insufficient documentation

## 2018-01-20 DIAGNOSIS — I87333 Chronic venous hypertension (idiopathic) with ulcer and inflammation of bilateral lower extremity: Secondary | ICD-10-CM | POA: Insufficient documentation

## 2018-01-20 DIAGNOSIS — L97529 Non-pressure chronic ulcer of other part of left foot with unspecified severity: Secondary | ICD-10-CM | POA: Insufficient documentation

## 2018-01-20 DIAGNOSIS — L97822 Non-pressure chronic ulcer of other part of left lower leg with fat layer exposed: Secondary | ICD-10-CM | POA: Insufficient documentation

## 2018-01-27 DIAGNOSIS — I87333 Chronic venous hypertension (idiopathic) with ulcer and inflammation of bilateral lower extremity: Secondary | ICD-10-CM | POA: Diagnosis not present

## 2018-02-10 DIAGNOSIS — I87333 Chronic venous hypertension (idiopathic) with ulcer and inflammation of bilateral lower extremity: Secondary | ICD-10-CM | POA: Diagnosis not present

## 2018-02-24 ENCOUNTER — Encounter (HOSPITAL_BASED_OUTPATIENT_CLINIC_OR_DEPARTMENT_OTHER): Payer: Medicare PPO | Attending: Physician Assistant

## 2018-02-24 DIAGNOSIS — L97322 Non-pressure chronic ulcer of left ankle with fat layer exposed: Secondary | ICD-10-CM | POA: Insufficient documentation

## 2018-02-24 DIAGNOSIS — L97822 Non-pressure chronic ulcer of other part of left lower leg with fat layer exposed: Secondary | ICD-10-CM | POA: Insufficient documentation

## 2018-02-24 DIAGNOSIS — E11621 Type 2 diabetes mellitus with foot ulcer: Secondary | ICD-10-CM | POA: Insufficient documentation

## 2018-02-24 DIAGNOSIS — I890 Lymphedema, not elsewhere classified: Secondary | ICD-10-CM | POA: Diagnosis not present

## 2018-02-24 DIAGNOSIS — Z993 Dependence on wheelchair: Secondary | ICD-10-CM | POA: Diagnosis not present

## 2018-02-24 DIAGNOSIS — I10 Essential (primary) hypertension: Secondary | ICD-10-CM | POA: Diagnosis not present

## 2018-02-24 DIAGNOSIS — E11622 Type 2 diabetes mellitus with other skin ulcer: Secondary | ICD-10-CM | POA: Diagnosis not present

## 2018-02-24 DIAGNOSIS — L97529 Non-pressure chronic ulcer of other part of left foot with unspecified severity: Secondary | ICD-10-CM | POA: Insufficient documentation

## 2018-02-24 DIAGNOSIS — Z6841 Body Mass Index (BMI) 40.0 and over, adult: Secondary | ICD-10-CM | POA: Insufficient documentation

## 2018-03-10 DIAGNOSIS — E11621 Type 2 diabetes mellitus with foot ulcer: Secondary | ICD-10-CM | POA: Diagnosis not present

## 2018-03-31 ENCOUNTER — Encounter (HOSPITAL_BASED_OUTPATIENT_CLINIC_OR_DEPARTMENT_OTHER): Payer: Medicare PPO | Attending: Physician Assistant

## 2018-03-31 DIAGNOSIS — E11621 Type 2 diabetes mellitus with foot ulcer: Secondary | ICD-10-CM | POA: Insufficient documentation

## 2018-03-31 DIAGNOSIS — I890 Lymphedema, not elsewhere classified: Secondary | ICD-10-CM | POA: Insufficient documentation

## 2018-03-31 DIAGNOSIS — L97322 Non-pressure chronic ulcer of left ankle with fat layer exposed: Secondary | ICD-10-CM | POA: Diagnosis not present

## 2018-03-31 DIAGNOSIS — L97822 Non-pressure chronic ulcer of other part of left lower leg with fat layer exposed: Secondary | ICD-10-CM | POA: Insufficient documentation

## 2018-03-31 DIAGNOSIS — I10 Essential (primary) hypertension: Secondary | ICD-10-CM | POA: Diagnosis not present

## 2018-03-31 DIAGNOSIS — Z993 Dependence on wheelchair: Secondary | ICD-10-CM | POA: Insufficient documentation

## 2018-03-31 DIAGNOSIS — Z6841 Body Mass Index (BMI) 40.0 and over, adult: Secondary | ICD-10-CM | POA: Diagnosis not present

## 2018-03-31 DIAGNOSIS — L97529 Non-pressure chronic ulcer of other part of left foot with unspecified severity: Secondary | ICD-10-CM | POA: Diagnosis not present

## 2018-03-31 DIAGNOSIS — E1151 Type 2 diabetes mellitus with diabetic peripheral angiopathy without gangrene: Secondary | ICD-10-CM | POA: Diagnosis not present

## 2018-03-31 DIAGNOSIS — Z794 Long term (current) use of insulin: Secondary | ICD-10-CM | POA: Diagnosis not present

## 2018-04-07 DIAGNOSIS — E11621 Type 2 diabetes mellitus with foot ulcer: Secondary | ICD-10-CM | POA: Diagnosis not present

## 2018-04-14 DIAGNOSIS — E11621 Type 2 diabetes mellitus with foot ulcer: Secondary | ICD-10-CM | POA: Diagnosis not present

## 2018-04-21 ENCOUNTER — Ambulatory Visit (HOSPITAL_BASED_OUTPATIENT_CLINIC_OR_DEPARTMENT_OTHER): Payer: Medicare PPO

## 2018-04-21 ENCOUNTER — Encounter (HOSPITAL_BASED_OUTPATIENT_CLINIC_OR_DEPARTMENT_OTHER): Payer: Medicare PPO | Attending: Internal Medicine

## 2018-04-21 ENCOUNTER — Encounter (HOSPITAL_BASED_OUTPATIENT_CLINIC_OR_DEPARTMENT_OTHER): Payer: Medicare PPO

## 2018-04-21 DIAGNOSIS — I87332 Chronic venous hypertension (idiopathic) with ulcer and inflammation of left lower extremity: Secondary | ICD-10-CM | POA: Diagnosis not present

## 2018-04-21 DIAGNOSIS — R531 Weakness: Secondary | ICD-10-CM | POA: Insufficient documentation

## 2018-04-21 DIAGNOSIS — L97822 Non-pressure chronic ulcer of other part of left lower leg with fat layer exposed: Secondary | ICD-10-CM | POA: Insufficient documentation

## 2018-04-21 DIAGNOSIS — F418 Other specified anxiety disorders: Secondary | ICD-10-CM | POA: Insufficient documentation

## 2018-04-21 DIAGNOSIS — I890 Lymphedema, not elsewhere classified: Secondary | ICD-10-CM | POA: Diagnosis not present

## 2018-04-21 DIAGNOSIS — Z993 Dependence on wheelchair: Secondary | ICD-10-CM | POA: Insufficient documentation

## 2018-04-21 DIAGNOSIS — Z6841 Body Mass Index (BMI) 40.0 and over, adult: Secondary | ICD-10-CM | POA: Diagnosis not present

## 2018-04-21 DIAGNOSIS — I10 Essential (primary) hypertension: Secondary | ICD-10-CM | POA: Insufficient documentation

## 2018-04-21 DIAGNOSIS — L97829 Non-pressure chronic ulcer of other part of left lower leg with unspecified severity: Secondary | ICD-10-CM | POA: Insufficient documentation

## 2018-04-21 DIAGNOSIS — E1151 Type 2 diabetes mellitus with diabetic peripheral angiopathy without gangrene: Secondary | ICD-10-CM | POA: Insufficient documentation

## 2018-04-21 DIAGNOSIS — L97529 Non-pressure chronic ulcer of other part of left foot with unspecified severity: Secondary | ICD-10-CM | POA: Insufficient documentation

## 2018-04-21 DIAGNOSIS — E11622 Type 2 diabetes mellitus with other skin ulcer: Secondary | ICD-10-CM | POA: Diagnosis present

## 2018-04-28 DIAGNOSIS — E11622 Type 2 diabetes mellitus with other skin ulcer: Secondary | ICD-10-CM | POA: Diagnosis not present

## 2018-05-11 DIAGNOSIS — E11622 Type 2 diabetes mellitus with other skin ulcer: Secondary | ICD-10-CM | POA: Diagnosis not present

## 2018-05-26 ENCOUNTER — Encounter (HOSPITAL_BASED_OUTPATIENT_CLINIC_OR_DEPARTMENT_OTHER): Payer: Medicare PPO | Attending: Physician Assistant

## 2018-05-26 DIAGNOSIS — F418 Other specified anxiety disorders: Secondary | ICD-10-CM | POA: Diagnosis not present

## 2018-05-26 DIAGNOSIS — I10 Essential (primary) hypertension: Secondary | ICD-10-CM | POA: Insufficient documentation

## 2018-05-26 DIAGNOSIS — E11622 Type 2 diabetes mellitus with other skin ulcer: Secondary | ICD-10-CM | POA: Insufficient documentation

## 2018-05-26 DIAGNOSIS — L97822 Non-pressure chronic ulcer of other part of left lower leg with fat layer exposed: Secondary | ICD-10-CM | POA: Diagnosis not present

## 2018-05-26 DIAGNOSIS — I87332 Chronic venous hypertension (idiopathic) with ulcer and inflammation of left lower extremity: Secondary | ICD-10-CM | POA: Insufficient documentation

## 2018-05-26 DIAGNOSIS — R531 Weakness: Secondary | ICD-10-CM | POA: Insufficient documentation

## 2018-05-26 DIAGNOSIS — I890 Lymphedema, not elsewhere classified: Secondary | ICD-10-CM | POA: Insufficient documentation

## 2018-05-26 DIAGNOSIS — Z6841 Body Mass Index (BMI) 40.0 and over, adult: Secondary | ICD-10-CM | POA: Diagnosis not present

## 2018-06-02 DIAGNOSIS — E11622 Type 2 diabetes mellitus with other skin ulcer: Secondary | ICD-10-CM | POA: Diagnosis not present

## 2018-06-23 ENCOUNTER — Encounter (HOSPITAL_BASED_OUTPATIENT_CLINIC_OR_DEPARTMENT_OTHER): Payer: Medicare PPO | Attending: Physician Assistant

## 2018-06-23 DIAGNOSIS — L97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 leg with fat layer exposed: Secondary | ICD-10-CM | POA: Insufficient documentation

## 2018-06-23 DIAGNOSIS — I10 Essential (primary) hypertension: Secondary | ICD-10-CM | POA: Diagnosis not present

## 2018-06-23 DIAGNOSIS — Z6841 Body Mass Index (BMI) 40.0 and over, adult: Secondary | ICD-10-CM | POA: Insufficient documentation

## 2018-06-23 DIAGNOSIS — L97822 Non-pressure chronic ulcer of other part of left lower leg with fat layer exposed: Secondary | ICD-10-CM | POA: Diagnosis not present

## 2018-06-23 DIAGNOSIS — F418 Other specified anxiety disorders: Secondary | ICD-10-CM | POA: Diagnosis not present

## 2018-06-23 DIAGNOSIS — E11622 Type 2 diabetes mellitus with other skin ulcer: Secondary | ICD-10-CM | POA: Diagnosis present

## 2018-06-23 DIAGNOSIS — I890 Lymphedema, not elsewhere classified: Secondary | ICD-10-CM | POA: Diagnosis not present

## 2018-06-23 DIAGNOSIS — L97512 Non-pressure chronic ulcer of other part of right foot with fat layer exposed: Secondary | ICD-10-CM | POA: Insufficient documentation

## 2018-06-23 DIAGNOSIS — R531 Weakness: Secondary | ICD-10-CM | POA: Diagnosis not present

## 2018-06-23 DIAGNOSIS — Z993 Dependence on wheelchair: Secondary | ICD-10-CM | POA: Insufficient documentation

## 2018-06-23 DIAGNOSIS — L97322 Non-pressure chronic ulcer of left ankle with fat layer exposed: Secondary | ICD-10-CM | POA: Diagnosis not present

## 2018-06-23 DIAGNOSIS — I87333 Chronic venous hypertension (idiopathic) with ulcer and inflammation of bilateral lower extremity: Secondary | ICD-10-CM | POA: Insufficient documentation

## 2018-06-30 DIAGNOSIS — E11622 Type 2 diabetes mellitus with other skin ulcer: Secondary | ICD-10-CM | POA: Diagnosis not present

## 2018-07-07 DIAGNOSIS — E11622 Type 2 diabetes mellitus with other skin ulcer: Secondary | ICD-10-CM | POA: Diagnosis not present

## 2018-07-14 DIAGNOSIS — E11622 Type 2 diabetes mellitus with other skin ulcer: Secondary | ICD-10-CM | POA: Diagnosis not present

## 2018-07-21 ENCOUNTER — Encounter (HOSPITAL_BASED_OUTPATIENT_CLINIC_OR_DEPARTMENT_OTHER): Payer: Medicare PPO | Attending: Internal Medicine

## 2018-07-21 DIAGNOSIS — E1151 Type 2 diabetes mellitus with diabetic peripheral angiopathy without gangrene: Secondary | ICD-10-CM | POA: Diagnosis not present

## 2018-07-21 DIAGNOSIS — I87332 Chronic venous hypertension (idiopathic) with ulcer and inflammation of left lower extremity: Secondary | ICD-10-CM | POA: Diagnosis not present

## 2018-07-21 DIAGNOSIS — L97222 Non-pressure chronic ulcer of left calf with fat layer exposed: Secondary | ICD-10-CM | POA: Diagnosis not present

## 2018-07-21 DIAGNOSIS — I10 Essential (primary) hypertension: Secondary | ICD-10-CM | POA: Diagnosis not present

## 2018-07-21 DIAGNOSIS — Z6841 Body Mass Index (BMI) 40.0 and over, adult: Secondary | ICD-10-CM | POA: Insufficient documentation

## 2018-07-21 DIAGNOSIS — Z794 Long term (current) use of insulin: Secondary | ICD-10-CM | POA: Insufficient documentation

## 2018-07-21 DIAGNOSIS — L97822 Non-pressure chronic ulcer of other part of left lower leg with fat layer exposed: Secondary | ICD-10-CM | POA: Insufficient documentation

## 2018-07-21 DIAGNOSIS — I890 Lymphedema, not elsewhere classified: Secondary | ICD-10-CM | POA: Insufficient documentation

## 2018-07-21 DIAGNOSIS — L97212 Non-pressure chronic ulcer of right calf with fat layer exposed: Secondary | ICD-10-CM | POA: Insufficient documentation

## 2018-07-21 DIAGNOSIS — E11622 Type 2 diabetes mellitus with other skin ulcer: Secondary | ICD-10-CM | POA: Insufficient documentation

## 2018-07-28 DIAGNOSIS — E11622 Type 2 diabetes mellitus with other skin ulcer: Secondary | ICD-10-CM | POA: Diagnosis not present

## 2018-08-04 DIAGNOSIS — E11622 Type 2 diabetes mellitus with other skin ulcer: Secondary | ICD-10-CM | POA: Diagnosis not present

## 2018-08-11 DIAGNOSIS — E11622 Type 2 diabetes mellitus with other skin ulcer: Secondary | ICD-10-CM | POA: Diagnosis not present

## 2018-08-18 ENCOUNTER — Encounter (HOSPITAL_BASED_OUTPATIENT_CLINIC_OR_DEPARTMENT_OTHER): Payer: Medicare PPO | Attending: Internal Medicine

## 2018-08-18 DIAGNOSIS — L97822 Non-pressure chronic ulcer of other part of left lower leg with fat layer exposed: Secondary | ICD-10-CM | POA: Insufficient documentation

## 2018-08-18 DIAGNOSIS — Z6841 Body Mass Index (BMI) 40.0 and over, adult: Secondary | ICD-10-CM | POA: Diagnosis not present

## 2018-08-18 DIAGNOSIS — I87333 Chronic venous hypertension (idiopathic) with ulcer and inflammation of bilateral lower extremity: Secondary | ICD-10-CM | POA: Insufficient documentation

## 2018-08-18 DIAGNOSIS — L97512 Non-pressure chronic ulcer of other part of right foot with fat layer exposed: Secondary | ICD-10-CM | POA: Diagnosis not present

## 2018-08-18 DIAGNOSIS — I10 Essential (primary) hypertension: Secondary | ICD-10-CM | POA: Diagnosis not present

## 2018-08-18 DIAGNOSIS — L97312 Non-pressure chronic ulcer of right ankle with fat layer exposed: Secondary | ICD-10-CM | POA: Diagnosis not present

## 2018-08-18 DIAGNOSIS — F418 Other specified anxiety disorders: Secondary | ICD-10-CM | POA: Insufficient documentation

## 2018-08-18 DIAGNOSIS — I890 Lymphedema, not elsewhere classified: Secondary | ICD-10-CM | POA: Insufficient documentation

## 2018-08-18 DIAGNOSIS — R531 Weakness: Secondary | ICD-10-CM | POA: Insufficient documentation

## 2018-08-18 DIAGNOSIS — L97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 leg with fat layer exposed: Secondary | ICD-10-CM | POA: Insufficient documentation

## 2018-08-18 DIAGNOSIS — E11621 Type 2 diabetes mellitus with foot ulcer: Secondary | ICD-10-CM | POA: Diagnosis not present

## 2018-08-18 DIAGNOSIS — L97322 Non-pressure chronic ulcer of left ankle with fat layer exposed: Secondary | ICD-10-CM | POA: Insufficient documentation

## 2018-08-18 DIAGNOSIS — E11622 Type 2 diabetes mellitus with other skin ulcer: Secondary | ICD-10-CM | POA: Insufficient documentation

## 2018-08-25 ENCOUNTER — Other Ambulatory Visit (HOSPITAL_COMMUNITY)
Admission: RE | Admit: 2018-08-25 | Discharge: 2018-08-25 | Disposition: A | Discharge status: Home / Self Care | Payer: Medicare PPO | Source: Other Acute Inpatient Hospital | Attending: Geriatric Medicine | Admitting: Geriatric Medicine

## 2018-08-25 DIAGNOSIS — E11622 Type 2 diabetes mellitus with other skin ulcer: Secondary | ICD-10-CM | POA: Diagnosis not present

## 2018-08-25 DIAGNOSIS — L97822 Non-pressure chronic ulcer of other part of left lower leg with fat layer exposed: Secondary | ICD-10-CM | POA: Diagnosis present

## 2018-08-26 ENCOUNTER — Other Ambulatory Visit: Payer: Self-pay | Admitting: Family

## 2018-08-26 ENCOUNTER — Other Ambulatory Visit: Payer: Self-pay | Admitting: Family Medicine

## 2018-08-26 DIAGNOSIS — Z1231 Encounter for screening mammogram for malignant neoplasm of breast: Secondary | ICD-10-CM

## 2018-08-30 ENCOUNTER — Emergency Department (HOSPITAL_COMMUNITY)
Admission: EM | Admit: 2018-08-30 | Discharge: 2018-08-31 | Disposition: A | Discharge status: Home / Self Care | Payer: Medicare PPO | Attending: Emergency Medicine | Admitting: Emergency Medicine

## 2018-08-30 ENCOUNTER — Other Ambulatory Visit: Payer: Self-pay

## 2018-08-30 DIAGNOSIS — L03116 Cellulitis of left lower limb: Secondary | ICD-10-CM | POA: Diagnosis not present

## 2018-08-30 DIAGNOSIS — L03119 Cellulitis of unspecified part of limb: Secondary | ICD-10-CM

## 2018-08-30 DIAGNOSIS — Z794 Long term (current) use of insulin: Secondary | ICD-10-CM | POA: Diagnosis not present

## 2018-08-30 DIAGNOSIS — E119 Type 2 diabetes mellitus without complications: Secondary | ICD-10-CM | POA: Diagnosis not present

## 2018-08-30 DIAGNOSIS — L03115 Cellulitis of right lower limb: Secondary | ICD-10-CM | POA: Diagnosis not present

## 2018-08-30 DIAGNOSIS — Z7982 Long term (current) use of aspirin: Secondary | ICD-10-CM | POA: Insufficient documentation

## 2018-08-30 DIAGNOSIS — Z8673 Personal history of transient ischemic attack (TIA), and cerebral infarction without residual deficits: Secondary | ICD-10-CM | POA: Insufficient documentation

## 2018-08-30 DIAGNOSIS — Z79899 Other long term (current) drug therapy: Secondary | ICD-10-CM | POA: Diagnosis not present

## 2018-08-30 DIAGNOSIS — I10 Essential (primary) hypertension: Secondary | ICD-10-CM | POA: Diagnosis not present

## 2018-08-30 LAB — AEROBIC CULTURE W GRAM STAIN (SUPERFICIAL SPECIMEN)

## 2018-08-30 LAB — COMPREHENSIVE METABOLIC PANEL
ALT: 14 U/L (ref 0–44)
ANION GAP: 7 (ref 5–15)
AST: 15 U/L (ref 15–41)
Albumin: 3.1 g/dL — ABNORMAL LOW (ref 3.5–5.0)
Alkaline Phosphatase: 104 U/L (ref 38–126)
BILIRUBIN TOTAL: 0.3 mg/dL (ref 0.3–1.2)
BUN: 13 mg/dL (ref 8–23)
CO2: 24 mmol/L (ref 22–32)
Calcium: 9.2 mg/dL (ref 8.9–10.3)
Chloride: 107 mmol/L (ref 98–111)
Creatinine, Ser: 1.1 mg/dL — ABNORMAL HIGH (ref 0.44–1.00)
GFR calc Af Amer: 59 mL/min — ABNORMAL LOW (ref >60)
GFR calc non Af Amer: 51 mL/min — ABNORMAL LOW (ref >60)
Glucose, Bld: 83 mg/dL (ref 70–99)
Potassium: 4.5 mmol/L (ref 3.5–5.1)
Sodium: 138 mmol/L (ref 135–145)
TOTAL PROTEIN: 8.5 g/dL — AB (ref 6.5–8.1)

## 2018-08-30 LAB — CBC WITH DIFFERENTIAL/PLATELET
Abs Immature Granulocytes: 0.03 10*3/uL (ref 0.00–0.07)
BASOS PCT: 0 %
Basophils Absolute: 0 10*3/uL (ref 0.0–0.1)
EOS ABS: 0.4 10*3/uL (ref 0.0–0.5)
Eosinophils Relative: 4 %
HCT: 38.8 % (ref 36.0–46.0)
Hemoglobin: 12 g/dL (ref 12.0–15.0)
Immature Granulocytes: 0 %
Lymphocytes Relative: 27 %
Lymphs Abs: 2.6 10*3/uL (ref 0.7–4.0)
MCH: 28.2 pg (ref 26.0–34.0)
MCHC: 30.9 g/dL (ref 30.0–36.0)
MCV: 91.3 fL (ref 80.0–100.0)
Monocytes Absolute: 0.8 10*3/uL (ref 0.1–1.0)
Monocytes Relative: 9 %
Neutro Abs: 5.6 10*3/uL (ref 1.7–7.7)
Neutrophils Relative %: 60 %
PLATELETS: 333 10*3/uL (ref 150–400)
RBC: 4.25 MIL/uL (ref 3.87–5.11)
RDW: 15.8 % — ABNORMAL HIGH (ref 11.5–15.5)
WBC: 9.5 10*3/uL (ref 4.0–10.5)
nRBC: 0 % (ref 0.0–0.2)

## 2018-08-30 LAB — AEROBIC CULTURE  (SUPERFICIAL SPECIMEN)

## 2018-08-30 LAB — LACTIC ACID, PLASMA: Lactic Acid, Venous: 1.1 mmol/L (ref 0.5–1.9)

## 2018-08-30 MED ORDER — PIPERACILLIN-TAZOBACTAM 3.375 G IVPB 30 MIN
3.3750 g | Freq: Once | INTRAVENOUS | Status: AC
  Administered 2018-08-30: 3.375 g via INTRAVENOUS
  Filled 2018-08-30: qty 50

## 2018-08-30 MED ORDER — SODIUM CHLORIDE 0.9% FLUSH: 3.0000 mL | Freq: Once | INTRAVENOUS | Status: DC

## 2018-08-30 NOTE — ED Notes (Signed)
Patient verbalizes understanding of discharge instructions. Opportunity for questioning and answers were provided. Armband removed by staff, pt discharged from ED.  

## 2018-08-30 NOTE — ED Provider Notes (Signed)
[facility] EMERGENCY DEPARTMENT Provider Note   CSN: [id number] Arrival date & time: 08/30/18  1933    History   Chief Complaint Chief Complaint  Patient presents with  . Cellulitis    HPI [name] is a 68 y.o. female.     HPI Patient presented to the emergency room for evaluation of possible cellulitis.  Patient states she has had wounds on her lower legs for several months.  She has been diagnosed recurrently with cellulitis.  Patient has some chronic drainage and goes to the wound clinic.  Patient has a home health nurse that comes and assists her and help wraps her legs.  Patient has continued to have drainage.  Her home health nurse assessed her wounds today and felt like they were getting worse.  Her doctor was called who instructed her to come to the emergency room.  Patient has not had any fevers.  She denies any vomiting or diarrhea. Past Medical History:  Diagnosis Date  . Anxiety   . Arthritis    "back, arms, legs" (03/24/2016)  . Asthma   . Chronic lower back pain   . Colonic polyp    last colonoscopy done in 2009 with normal results per medical record  . Depressive disorder   . Gastric polyp   . Gout    has taken allopurinol 393m once daily in past  . Headache   . History of hiatal hernia   . Hypertension   . Migraine    "none in awhile; might have a couple/year" (03/24/2016)  . Mixed hyperlipidemia    01/2016 Total chol 141, HDL 59, LDL 63, ration 1.1  . Osteoarthritis   . TIA (transient ischemic attack) 11/2014  . Type II diabetes mellitus ([facility]     Patient Active Problem List   Diagnosis Date Noted  . Diabetic foot ulcer associated with type 2 diabetes mellitus ([facility]   . Cellulitis 07/05/2016  . Depression with anxiety 05/30/2016  . Chronic skin ulcer of lower leg ([facility] 05/30/2016  . Generalized weakness 05/30/2016  . Left arm weakness 05/30/2016  . Left Lower extremity pain  04/02/2016  . Lower extremity pain, inferior,  left 04/02/2016  . Left leg pain   . Personal history of noncompliance with medical treatment, presenting hazards to health 03/31/2016  . Jaw pain 03/27/2016  . Left leg cellulitis 03/24/2016  . Cellulitis of left lower extremity   . Primary insomnia   . Adjustment disorder   . Morbid obesity ([facility] 01/27/2016  . Wheelchair dependent 01/27/2016  . Gout 01/27/2016  . Asthma 01/27/2016  . Arthritis 01/27/2016  . Uncontrolled diabetes mellitus type 2 with peripheral artery disease ([place] 01/25/2016  . Cellulitis of left leg 01/11/2016  . Insulin-requiring or dependent type II diabetes mellitus ([place] 01/11/2016  . Hypertension 01/11/2016    Past Surgical History:  Procedure Laterality Date  . ARTERY BIOPSY Right 08/12/2016   Procedure: BIOPSY TEMPORAL ARTERY;  Surgeon: CElam [name] MD;  [place]  Service: Vascular;  Laterality: Right;  BIOPSY TEMPORAL ARTERY  . BREAST BIOPSY Left ~ 2015   benign  . CARPAL TUNNEL RELEASE Bilateral   . DILATION AND CURETTAGE OF UTERUS    . [place]AROSCOPIC CHOLE[place]STECTOMY    . TUBAL LIGATION    . VAGINAL HYSTERECTOMY  1982     OB History   No obstetric history on file.      Home Medications  Prior to Admission medications   Medication Sig Start Date End Date Taking? Authorizing Provider  ACCU-CHEK FASTCLIX LANCETS MISC 1 each as needed. 03/20/17   [provider]  ACCU-CHEK SMARTVIEW test strip 1 each 4 (four) times daily. for testing 06/15/17   [provider]  acetaminophen (TYLENOL) 500 MG tablet Take 500 mg by mouth daily as needed for moderate pain.    [provider]  albuterol (PROVENTIL HFA;VENTOLIN HFA) 108 (90 Base) MCG/ACT inhaler Inhale 2 puffs into the lungs every 4 (four) hours as needed for wheezing or shortness of breath.     [provider]  allopurinol (ZYLOPRIM) 300 MG tablet Take 300 mg by mouth daily.    [provider]  aspirin  325 MG tablet Take 325 mg by mouth daily.    [provider]  atenolol (TENORMIN) 25 MG tablet Take 25-50 mg by mouth 2 (two) times daily. Take 50 mg every morning and then 25 mg every evening    [provider]  Blood Glucose Monitoring Suppl (ACCU-CHEK NANO SMARTVIEW) w/Device KIT 1 each See admin instructions. 05/23/17   [provider]  Cholecalciferol (VITAMIN D3) 2000 units capsule Take 2,000 Units by mouth daily.    [provider]  cyclobenzaprine (FLEXERIL) 10 MG tablet Take 10 mg by mouth at bedtime as needed for muscle spasms.     [provider]  diclofenac sodium (VOLTAREN) 1 % GEL Apply 4 g topically 4 (four) times daily. Patient taking differently: Apply 4 g topically 4 (four) times daily as needed (pain).  02/25/16   Henson, [name] L, NP-C  Diclofenac Sodium CR (VOLTAREN-XR) 100 MG 24 hr tablet Take 1 tablet (100 mg total) by mouth daily. 09/09/17   Palumbo, April, MD  doxycycline (VIBRAMYCIN) 100 MG capsule Take 1 capsule (100 mg total) by mouth 2 (two) times daily. 08/11/17   Petrucelli, Samantha R, PA-C  furosemide (LASIX) 20 MG tablet Take 1 tablet (20 mg total) by mouth daily. 07/07/16   Thurnell Lose, MD  gabapentin (NEURONTIN) 300 MG capsule Take 1 capsule (300 mg total) by mouth 2 (two) times daily. Patient taking differently: Take 600 mg by mouth 2 (two) times daily.  01/31/16   Henson, [name] L, NP-C  hydrocerin (EUCERIN) CREA Apply 1 application topically daily. 01/15/16   Thurnell Lose, MD  HYDROcodone-acetaminophen (NORCO/VICODIN) 5-325 MG tablet Take 1-2 tablets by mouth every 6 (six) hours as needed. Patient not taking: Reported on 08/11/2017 02/26/17   Ward, Delice Bison, DO  insulin degludec (TRESIBA FLEXTOUCH) 100 UNIT/ML SOPN FlexTouch Pen Inject 0.75 mLs (75 Units total) into the skin daily. Patient taking differently: Inject 75 Units into the skin every morning.  03/14/16   Henson, [name] L, NP-C  magnesium gluconate  (MAGONATE) 500 MG tablet Take 500 mg by mouth daily.    [provider]  NIFEdipine (PROCARDIA XL/ADALAT-CC) 90 MG 24 hr tablet Take 90 mg by mouth daily.    [provider]  NOVOFINE 32G X 6 MM MISC 1 each as needed. 08/09/17   [provider]  NOVOLOG FLEXPEN 100 UNIT/ML FlexPen Take 17 Units by mouth 2 (two) times daily. 01/30/17   [provider]  polyethylene glycol (MIRALAX / GLYCOLAX) packet Take 17 g by mouth daily as needed for moderate constipation.    [provider]  potassium chloride (K-DUR) 10 MEQ tablet Take 1 tablet (10 mEq total) by mouth daily. 07/07/16   Thurnell Lose, MD  protein  supplement shake (PREMIER PROTEIN) LIQD Take 325 mLs (11 oz total) by mouth daily. 03/18/17   Eugenie Filler, MD    Family History Family History  Problem Relation Age of Onset  . Stroke Father   . Stroke Brother   . Cancer Other     Social History Social History   Tobacco Use  . Smoking status: Never Smoker  . Smokeless tobacco: Never Used  Substance Use Topics  . Alcohol use: No  . Drug use: No     Allergies   Ace inhibitors; Tizanidine; Metformin and related; and Propofol   Review of Systems Review of Systems  All other systems reviewed and are negative.    Physical Exam Updated Vital Signs BP 136/76   Pulse 79   Temp (!) 97.2 F (36.2 C) (Oral)   Resp 18   Ht 1.651 m (_0 )   Wt (!) 145.2 kg   SpO2 96%   BMI 53.25 kg/m   Physical Exam Vitals signs and nursing note reviewed.  Constitutional:      General: She is not in acute distress.    Appearance: She is well-developed.  HENT:     Head: Normocephalic and atraumatic.     Right Ear: External ear normal.     Left Ear: External ear normal.  Eyes:     General: No scleral icterus.       Right eye: No discharge.        Left eye: No discharge.     Conjunctiva/sclera: Conjunctivae normal.  Neck:     Musculoskeletal: Neck supple.     Trachea: No tracheal  deviation.  Cardiovascular:     Rate and Rhythm: Normal rate and regular rhythm.  Pulmonary:     Effort: Pulmonary effort is normal. No respiratory distress.     Breath sounds: Normal breath sounds. No stridor. No wheezing or rales.  Abdominal:     General: Bowel sounds are normal. There is no distension.     Palpations: Abdomen is soft.     Tenderness: There is no abdominal tenderness. There is no guarding or rebound.  Musculoskeletal:        General: Swelling present. No tenderness.     Comments: Edema of the left lower leg compared to the right, patient states this is chronic, superficial wounds noted bilateral lower extremities below the knee, malodorous drainage,  chronic venous stasis changes, no increased warmth, no erythema extending up the leg  Skin:    General: Skin is warm and dry.     Findings: No rash.  Neurological:     Mental Status: She is alert.     Cranial Nerves: No cranial nerve deficit (no facial droop, extraocular movements intact, no slurred speech).     Sensory: No sensory deficit.     Motor: No abnormal muscle tone or seizure activity.     Coordination: Coordination normal.          ED Treatments / Results  Labs (all labs ordered are listed, but only abnormal results are displayed) Labs Reviewed  COMPREHENSIVE METABOLIC PANEL - Abnormal; Notable for the following components:      Result Value   Creatinine, Ser 1.10 (*)    Total Protein 8.5 (*)    Albumin 3.1 (*)    GFR calc non Af Amer 51 (*)    GFR calc Af Amer 59 (*)    All other components within normal limits  CBC WITH DIFFERENTIAL/PLATELET - Abnormal; Notable for the following  components:   RDW 15.8 (*)    All other components within normal limits  LACTIC ACID, PLASMA    EKG None  Radiology No results found.  Procedures Procedures (including critical care time)  Medications Ordered in ED Medications  sodium chloride flush (NS) 0.9 % injection 3 mL (has no administration in time  range)  piperacillin-tazobactam (ZOSYN) IVPB 3.375 g (0 g Intravenous Stopped 08/30/18 2150)     Initial Impression / Assessment and Plan / ED Course  I have reviewed the triage vital signs and the nursing notes.  Pertinent labs & imaging results that were available during my care of the patient were reviewed by me and considered in my medical decision making (see chart for details).   Pt presented to the ED with possible lower extremity cellulitis in the setting of chronic diabetic wounds.  Patient's laboratory tests are reassuring.  No leukocytosis.  She is afebrile.  Lactic acid is normal.  No signs of severe systemic infection.  Patient appears stable for continued outpatient management.  Final Clinical Impressions(s) / ED Diagnoses   Final diagnoses:  Cellulitis of lower extremity, unspecified laterality    ED Discharge Orders    None       Dorie Rank, MD 08/30/18 2330

## 2018-08-30 NOTE — ED Triage Notes (Signed)
Pt presents with cellulitis to BLE. States she was started on Bactrim few days ago, has seen no improvement. Home health nurse suggested she come to ED for further eval

## 2018-08-30 NOTE — Discharge Instructions (Addendum)
You were given a dose of antibiotics IV this evening.  Continue your current oral regimen.  Follow-up with your primary care doctor to make sure your symptoms are improving

## 2018-09-01 DIAGNOSIS — E11622 Type 2 diabetes mellitus with other skin ulcer: Secondary | ICD-10-CM | POA: Diagnosis not present

## 2018-09-08 DIAGNOSIS — E11622 Type 2 diabetes mellitus with other skin ulcer: Secondary | ICD-10-CM | POA: Diagnosis not present

## 2018-09-10 ENCOUNTER — Ambulatory Visit: Payer: Medicare PPO

## 2018-09-14 ENCOUNTER — Encounter (HOSPITAL_COMMUNITY): Payer: Self-pay

## 2018-09-14 ENCOUNTER — Emergency Department (HOSPITAL_COMMUNITY)
Admission: EM | Admit: 2018-09-14 | Discharge: 2018-09-14 | Disposition: A | Discharge status: Home / Self Care | Payer: Medicare PPO | Attending: Emergency Medicine | Admitting: Emergency Medicine

## 2018-09-14 ENCOUNTER — Other Ambulatory Visit: Payer: Self-pay

## 2018-09-14 DIAGNOSIS — Z7982 Long term (current) use of aspirin: Secondary | ICD-10-CM | POA: Diagnosis not present

## 2018-09-14 DIAGNOSIS — I10 Essential (primary) hypertension: Secondary | ICD-10-CM | POA: Diagnosis not present

## 2018-09-14 DIAGNOSIS — L03116 Cellulitis of left lower limb: Secondary | ICD-10-CM | POA: Diagnosis not present

## 2018-09-14 DIAGNOSIS — J45909 Unspecified asthma, uncomplicated: Secondary | ICD-10-CM | POA: Insufficient documentation

## 2018-09-14 DIAGNOSIS — E119 Type 2 diabetes mellitus without complications: Secondary | ICD-10-CM | POA: Insufficient documentation

## 2018-09-14 DIAGNOSIS — Z79899 Other long term (current) drug therapy: Secondary | ICD-10-CM | POA: Diagnosis not present

## 2018-09-14 DIAGNOSIS — M79605 Pain in left leg: Secondary | ICD-10-CM | POA: Diagnosis present

## 2018-09-14 DIAGNOSIS — S81802A Unspecified open wound, left lower leg, initial encounter: Secondary | ICD-10-CM

## 2018-09-14 DIAGNOSIS — R609 Edema, unspecified: Secondary | ICD-10-CM

## 2018-09-14 LAB — LACTIC ACID, PLASMA: LACTIC ACID, VENOUS: 1.1 mmol/L (ref 0.5–1.9)

## 2018-09-14 LAB — CBC
HCT: 39.5 % (ref 36.0–46.0)
HEMOGLOBIN: 12.2 g/dL (ref 12.0–15.0)
MCH: 27.9 pg (ref 26.0–34.0)
MCHC: 30.9 g/dL (ref 30.0–36.0)
MCV: 90.2 fL (ref 80.0–100.0)
Platelets: 362 10*3/uL (ref 150–400)
RBC: 4.38 MIL/uL (ref 3.87–5.11)
RDW: 15.8 % — ABNORMAL HIGH (ref 11.5–15.5)
WBC: 8.6 10*3/uL (ref 4.0–10.5)
nRBC: 0 % (ref 0.0–0.2)

## 2018-09-14 LAB — COMPREHENSIVE METABOLIC PANEL
ALT: 14 U/L (ref 0–44)
AST: 17 U/L (ref 15–41)
Albumin: 3.2 g/dL — ABNORMAL LOW (ref 3.5–5.0)
Alkaline Phosphatase: 94 U/L (ref 38–126)
Anion gap: 11 (ref 5–15)
BUN: 9 mg/dL (ref 8–23)
CO2: 22 mmol/L (ref 22–32)
Calcium: 9.3 mg/dL (ref 8.9–10.3)
Chloride: 101 mmol/L (ref 98–111)
Creatinine, Ser: 0.94 mg/dL (ref 0.44–1.00)
GFR calc Af Amer: >60 mL/min (ref >60)
GFR calc non Af Amer: >60 mL/min (ref >60)
Glucose, Bld: 140 mg/dL — ABNORMAL HIGH (ref 70–99)
Potassium: 3.5 mmol/L (ref 3.5–5.1)
Sodium: 134 mmol/L — ABNORMAL LOW (ref 135–145)
Total Bilirubin: 0.4 mg/dL (ref 0.3–1.2)
Total Protein: 9.6 g/dL — ABNORMAL HIGH (ref 6.5–8.1)

## 2018-09-14 MED ORDER — CEFTRIAXONE SODIUM 1 G IJ SOLR
1.0000 g | Freq: Once | INTRAMUSCULAR | Status: AC
  Administered 2018-09-14: 1 g via INTRAMUSCULAR
  Filled 2018-09-14: qty 10

## 2018-09-14 MED ORDER — AMOXICILLIN-POT CLAVULANATE 875-125 MG PO TABS
1.0000 | ORAL_TABLET | Freq: Once | ORAL | Status: AC
  Administered 2018-09-14: 1 via ORAL
  Filled 2018-09-14: qty 1

## 2018-09-14 MED ORDER — AMOXICILLIN-POT CLAVULANATE 875-125 MG PO TABS: 1.0000 | ORAL_TABLET | Freq: Two times a day (BID) | ORAL | 0 refills | Status: AC

## 2018-09-14 NOTE — ED Triage Notes (Signed)
Pt advised to come here by wound care RN for bilateral leg swelling and evaluation of cellulitis.  Seen here recently for the same.  Pt states swelling has not increased or decreased since.  A&Ox4, wounds wrapped by wound care this AM.

## 2018-09-14 NOTE — Discharge Instructions (Addendum)
Call Fatima Sanger for follow-up in 24-48 hours for wound check; preferably, you should call the wound clinic to be seen as well  Continue your home wound care  Start the Augmentin for additional coverage

## 2018-09-14 NOTE — ED Provider Notes (Signed)
[facility] EMERGENCY DEPARTMENT Provider Note   CSN: [id number] Arrival date & time: 09/14/18  2004    History   Chief Complaint Chief Complaint  Patient presents with   Leg Swelling    HPI [name] is a 68 y.o. female.     HPI   68 year old female with past medical history as below here with leg pain.  The patient states that she has chronic leg pain and chronic wound breakdown.  She is been seeing the wound care clinic and having regular nurse checks.  She states she had some drainage from her wound yesterday, and the nurse asked her to call her doctor.  She tried to call her doctor today and was told to come to the ER for evaluation as they could not see her.  She denies any fevers or chills.  No increased pain from her baseline.  No purulence.  She does admit to foul odor, but this is also been a chronic issue.  She completed a course of antibiotics recently for this, which did slightly improve her symptoms.  No fevers, chills, generalized weakness.  No nausea or vomiting.  Her pain is an aching, mild pain that is worse with weightbearing.  No alleviating factors.  No other complaints.  Past Medical History:  Diagnosis Date   Anxiety    Arthritis    "back, arms, legs" (03/24/2016)   Asthma    Chronic lower back pain    Colonic polyp    last colonoscopy done in 2009 with normal results per medical record   Depressive disorder    Gastric polyp    Gout    has taken allopurinol 335m once daily in past   Headache    History of hiatal hernia    Hypertension    Migraine    "none in awhile; might have a couple/year" (03/24/2016)   Mixed hyperlipidemia    01/2016 Total chol 141, HDL 59, LDL 63, ration 1.1   Osteoarthritis    TIA (transient ischemic attack) 11/2014   Type II diabetes mellitus ([place]     Patient Active Problem List   Diagnosis Date Noted   Diabetic foot ulcer associated with type 2 diabetes mellitus ([facility]     Cellulitis 07/05/2016   Depression with anxiety 05/30/2016   Chronic skin ulcer of lower leg ([facility] 05/30/2016   Generalized weakness 05/30/2016   Left arm weakness 05/30/2016   Left Lower extremity pain  04/02/2016   Lower extremity pain, inferior, left 04/02/2016   Left leg pain    Personal history of noncompliance with medical treatment, presenting hazards to health 03/31/2016   Jaw pain 03/27/2016   Left leg cellulitis 03/24/2016   Cellulitis of left lower extremity    Primary insomnia    Adjustment disorder    Morbid obesity ([facility] 01/27/2016   Wheelchair dependent 01/27/2016   Gout 01/27/2016   Asthma 01/27/2016   Arthritis 01/27/2016   Uncontrolled diabetes mellitus type 2 with peripheral artery disease ([place] 01/25/2016   Cellulitis of left leg 01/11/2016   Insulin-requiring or dependent type II diabetes mellitus ([place] 01/11/2016   Hypertension 01/11/2016    Past Surgical History:  Procedure Laterality Date   ARTERY BIOPSY Right 08/12/2016   Procedure: BIOPSY TEMPORAL ARTERY;  Surgeon: CElam [name] MD;  [place]  Service: Vascular;  Laterality: Right;  BIOPSY TEMPORAL ARTERY   BREAST BIOPSY Left ~ 2015   benign   CARPAL TUNNEL RELEASE Bilateral  DILATION AND CURETTAGE OF UTERUS     KNEE ARTHROSCOPY Right 2003   in [place]     OB History   No obstetric history on file.      Home Medications    Prior to Admission medications   Medication Sig Start Date End Date Taking? Authorizing Provider  ACCU-CHEK FASTCLIX LANCETS MISC 1 each as needed. 03/20/17   [provider]  ACCU-CHEK SMARTVIEW test strip 1 each 4 (four) times daily. for testing 06/15/17   [provider]  acetaminophen (TYLENOL) 500 MG tablet Take 500 mg by mouth daily as needed for moderate pain.    [provider]  albuterol (PROVENTIL HFA;VENTOLIN HFA)  108 (90 Base) MCG/ACT inhaler Inhale 2 puffs into the lungs every 4 (four) hours as needed for wheezing or shortness of breath.     [provider]  allopurinol (ZYLOPRIM) 300 MG tablet Take 300 mg by mouth daily.    [provider]  amoxicillin-clavulanate (AUGMENTIN) 875-125 MG tablet Take 1 tablet by mouth every 12 (twelve) hours for 10 days. 09/14/18 09/24/18  Duffy Bruce, MD  aspirin 325 MG tablet Take 325 mg by mouth daily.    [provider]  atenolol (TENORMIN) 25 MG tablet Take 25-50 mg by mouth 2 (two) times daily. Take 50 mg every morning and then 25 mg every evening    [provider]  Blood Glucose Monitoring Suppl (ACCU-CHEK NANO SMARTVIEW) w/Device KIT 1 each See admin instructions. 05/23/17   [provider]  Cholecalciferol (VITAMIN D3) 2000 units capsule Take 2,000 Units by mouth daily.    [provider]  cyclobenzaprine (FLEXERIL) 10 MG tablet Take 10 mg by mouth at bedtime as needed for muscle spasms.     [provider]  diclofenac sodium (VOLTAREN) 1 % GEL Apply 4 g topically 4 (four) times daily. Patient taking differently: Apply 4 g topically 4 (four) times daily as needed (pain).  02/25/16   Henson, [name] L, NP-C  Diclofenac Sodium CR (VOLTAREN-XR) 100 MG 24 hr tablet Take 1 tablet (100 mg total) by mouth daily. 09/09/17   Palumbo, April, MD  doxycycline (VIBRAMYCIN) 100 MG capsule Take 1 capsule (100 mg total) by mouth 2 (two) times daily. 08/11/17   Petrucelli, Samantha R, PA-C  furosemide (LASIX) 20 MG tablet Take 1 tablet (20 mg total) by mouth daily. 07/07/16   Thurnell Lose, MD  gabapentin (NEURONTIN) 300 MG capsule Take 1 capsule (300 mg total) by mouth 2 (two) times daily. Patient taking differently: Take 600 mg by mouth 2 (two) times daily.  01/31/16   Henson, [name] L, NP-C  hydrocerin (EUCERIN) CREA Apply 1 application topically daily. 01/15/16   Thurnell Lose, MD  HYDROcodone-acetaminophen  (NORCO/VICODIN) 5-325 MG tablet Take 1-2 tablets by mouth every 6 (six) hours as needed. Patient not taking: Reported on 08/11/2017 02/26/17   Ward, Delice Bison, DO  insulin degludec (TRESIBA FLEXTOUCH) 100 UNIT/ML SOPN FlexTouch Pen Inject 0.75 mLs (75 Units total) into the skin daily. Patient taking differently: Inject 75 Units into the skin every morning.  03/14/16   Henson, [name] L, NP-C  magnesium gluconate (MAGONATE) 500 MG tablet Take 500 mg by mouth daily.    [provider]  NIFEdipine (PROCARDIA XL/ADALAT-CC) 90 MG 24 hr tablet Take 90 mg by mouth daily.    [provider]  NOVOFINE 32G X 6  MM MISC 1 each as needed. 08/09/17   [provider]  NOVOLOG FLEXPEN 100 UNIT/ML FlexPen Take 17 Units by mouth 2 (two) times daily. 01/30/17   [provider]  polyethylene glycol (MIRALAX / GLYCOLAX) packet Take 17 g by mouth daily as needed for moderate constipation.    [provider]  potassium chloride (K-DUR) 10 MEQ tablet Take 1 tablet (10 mEq total) by mouth daily. 07/07/16   Thurnell Lose, MD  protein supplement shake (PREMIER PROTEIN) LIQD Take 325 mLs (11 oz total) by mouth daily. 03/18/17   Eugenie Filler, MD    Family History Family History  Problem Relation Age of Onset   Stroke Father    Stroke Brother    Cancer Other     Social History Social History   Tobacco Use   Smoking status: Never Smoker   Smokeless tobacco: Never Used  Substance Use Topics   Alcohol use: No   Drug use: No     Allergies   Ace inhibitors; Tizanidine; Metformin and related; and Propofol   Review of Systems Review of Systems  Constitutional: Negative for chills, fatigue and fever.  HENT: Negative for congestion and rhinorrhea.   Eyes: Negative for visual disturbance.  Respiratory: Negative for cough, shortness of breath and wheezing.   Cardiovascular: Positive for leg swelling. Negative for chest pain.  Gastrointestinal: Negative for  abdominal pain, diarrhea, nausea and vomiting.  Genitourinary: Negative for dysuria and flank pain.  Musculoskeletal: Negative for neck pain and neck stiffness.  Skin: Positive for wound. Negative for rash.  Allergic/Immunologic: Negative for immunocompromised state.  Neurological: Negative for syncope, weakness and headaches.  All other systems reviewed and are negative.    Physical Exam Updated Vital Signs BP 117/71    Pulse 77    Temp 98.6 F (37 C) (Oral)    Resp 18    Ht '5\' 4"'$  (1.626 m)    Wt (!) 148.8 kg    SpO2 97%    BMI 56.30 kg/m   Physical Exam Vitals signs and nursing note reviewed.  Constitutional:      General: She is not in acute distress.    Appearance: She is well-developed.  HENT:     Head: Normocephalic and atraumatic.  Eyes:     Conjunctiva/sclera: Conjunctivae normal.  Neck:     Musculoskeletal: Neck supple.  Cardiovascular:     Rate and Rhythm: Normal rate and regular rhythm.     Heart sounds: Normal heart sounds. No murmur. No friction rub.  Pulmonary:     Effort: Pulmonary effort is normal. No respiratory distress.     Breath sounds: Normal breath sounds. No wheezing or rales.  Abdominal:     General: There is no distension.     Palpations: Abdomen is soft.     Tenderness: There is no abdominal tenderness.  Musculoskeletal:     Right lower leg: Edema present.     Left lower leg: Edema present.  Skin:    General: Skin is warm.     Capillary Refill: Capillary refill takes less than 2 seconds.  Neurological:     Mental Status: She is alert and oriented to person, place, and time.     Motor: No abnormal muscle tone.               ED Treatments / Results  Labs (all labs ordered are listed, but only abnormal results are displayed) Labs Reviewed  COMPREHENSIVE METABOLIC PANEL - Abnormal; Notable for  the following components:      Result Value   Sodium 134 (*)    Glucose, Bld 140 (*)    Total Protein 9.6 (*)    Albumin 3.2 (*)     All other components within normal limits  CBC - Abnormal; Notable for the following components:   RDW 15.8 (*)    All other components within normal limits  LACTIC ACID, PLASMA  LACTIC ACID, PLASMA    EKG None  Radiology No results found.  Procedures Procedures (including critical care time)  Medications Ordered in ED Medications  cefTRIAXone (ROCEPHIN) injection 1 g (1 g Intramuscular Given 09/14/18 2222)  amoxicillin-clavulanate (AUGMENTIN) 875-125 MG per tablet 1 tablet (1 tablet Oral Given 09/14/18 2219)     Initial Impression / Assessment and Plan / ED Course  I have reviewed the triage vital signs and the nursing notes.  Pertinent labs & imaging results that were available during my care of the patient were reviewed by me and considered in my medical decision making (see chart for details).        68 year old female here for evaluation of chronic leg wounds.  She does have some weeping of her left leg wounds, but normal white blood cell count, no warmth or redness, no purulence, and I suspect this is more so secondary to chronic skin necrosis and wound breakdown.  No evidence of active gangrene.  She is otherwise hemodynamically stable and would like to continue outpatient management.  I think this is very reasonable.  No signs of deep or necrotizing infection. Wounds were cleaned and dressed. Will add anaerobic coverage with Augmentin as I have a low suspicion for MRSA and feel broader coverage is warranted. She is calling wound clinic tomorrow and will follow-up in 24-48 hours.  Final Clinical Impressions(s) / ED Diagnoses   Final diagnoses:  Peripheral edema  Wound of left lower extremity, initial encounter  Cellulitis of left lower extremity    ED Discharge Orders         Ordered    amoxicillin-clavulanate (AUGMENTIN) 875-125 MG tablet  Every 12 hours     09/14/18 2219           Duffy Bruce, MD 09/15/18 [phone]

## 2018-09-15 ENCOUNTER — Encounter (HOSPITAL_BASED_OUTPATIENT_CLINIC_OR_DEPARTMENT_OTHER): Payer: Medicare PPO | Attending: Physician Assistant

## 2018-09-15 DIAGNOSIS — L97312 Non-pressure chronic ulcer of right ankle with fat layer exposed: Secondary | ICD-10-CM | POA: Diagnosis not present

## 2018-09-15 DIAGNOSIS — E11622 Type 2 diabetes mellitus with other skin ulcer: Secondary | ICD-10-CM | POA: Diagnosis present

## 2018-09-15 DIAGNOSIS — L97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 leg with fat layer exposed: Secondary | ICD-10-CM | POA: Diagnosis not present

## 2018-09-15 DIAGNOSIS — Z6841 Body Mass Index (BMI) 40.0 and over, adult: Secondary | ICD-10-CM | POA: Insufficient documentation

## 2018-09-15 DIAGNOSIS — I890 Lymphedema, not elsewhere classified: Secondary | ICD-10-CM | POA: Diagnosis not present

## 2018-09-15 DIAGNOSIS — L97822 Non-pressure chronic ulcer of other part of left lower leg with fat layer exposed: Secondary | ICD-10-CM | POA: Diagnosis not present

## 2018-09-15 DIAGNOSIS — I10 Essential (primary) hypertension: Secondary | ICD-10-CM | POA: Insufficient documentation

## 2018-09-15 DIAGNOSIS — L97522 Non-pressure chronic ulcer of other part of left foot with fat layer exposed: Secondary | ICD-10-CM | POA: Insufficient documentation

## 2018-09-15 DIAGNOSIS — Z794 Long term (current) use of insulin: Secondary | ICD-10-CM | POA: Insufficient documentation

## 2018-09-15 DIAGNOSIS — Z993 Dependence on wheelchair: Secondary | ICD-10-CM | POA: Insufficient documentation

## 2018-09-15 DIAGNOSIS — L97222 Non-pressure chronic ulcer of left calf with fat layer exposed: Secondary | ICD-10-CM | POA: Diagnosis not present

## 2018-09-29 DIAGNOSIS — E11622 Type 2 diabetes mellitus with other skin ulcer: Secondary | ICD-10-CM | POA: Diagnosis not present

## 2018-10-06 DIAGNOSIS — E11622 Type 2 diabetes mellitus with other skin ulcer: Secondary | ICD-10-CM | POA: Diagnosis not present

## 2018-10-13 ENCOUNTER — Other Ambulatory Visit (HOSPITAL_COMMUNITY)
Admission: RE | Admit: 2018-10-13 | Discharge: 2018-10-13 | Disposition: A | Discharge status: Home / Self Care | Payer: Medicare PPO | Source: Other Acute Inpatient Hospital | Attending: Physician Assistant | Admitting: Physician Assistant

## 2018-10-13 DIAGNOSIS — L97822 Non-pressure chronic ulcer of other part of left lower leg with fat layer exposed: Secondary | ICD-10-CM | POA: Insufficient documentation

## 2018-10-13 DIAGNOSIS — E11622 Type 2 diabetes mellitus with other skin ulcer: Secondary | ICD-10-CM | POA: Diagnosis not present

## 2018-10-17 LAB — AEROBIC CULTURE W GRAM STAIN (SUPERFICIAL SPECIMEN)

## 2018-10-17 LAB — AEROBIC CULTURE? (SUPERFICIAL SPECIMEN)

## 2018-10-20 ENCOUNTER — Other Ambulatory Visit: Payer: Self-pay

## 2018-10-20 ENCOUNTER — Encounter (HOSPITAL_BASED_OUTPATIENT_CLINIC_OR_DEPARTMENT_OTHER): Payer: Medicare PPO | Attending: Physician Assistant

## 2018-10-20 DIAGNOSIS — L97522 Non-pressure chronic ulcer of other part of left foot with fat layer exposed: Secondary | ICD-10-CM | POA: Diagnosis not present

## 2018-10-20 DIAGNOSIS — F418 Other specified anxiety disorders: Secondary | ICD-10-CM | POA: Insufficient documentation

## 2018-10-20 DIAGNOSIS — L97822 Non-pressure chronic ulcer of other part of left lower leg with fat layer exposed: Secondary | ICD-10-CM | POA: Insufficient documentation

## 2018-10-20 DIAGNOSIS — I10 Essential (primary) hypertension: Secondary | ICD-10-CM | POA: Insufficient documentation

## 2018-10-20 DIAGNOSIS — R531 Weakness: Secondary | ICD-10-CM | POA: Diagnosis not present

## 2018-10-20 DIAGNOSIS — E11622 Type 2 diabetes mellitus with other skin ulcer: Secondary | ICD-10-CM | POA: Diagnosis not present

## 2018-10-20 DIAGNOSIS — I890 Lymphedema, not elsewhere classified: Secondary | ICD-10-CM | POA: Diagnosis not present

## 2018-10-20 DIAGNOSIS — Z6841 Body Mass Index (BMI) 40.0 and over, adult: Secondary | ICD-10-CM | POA: Diagnosis not present

## 2018-10-20 DIAGNOSIS — I87332 Chronic venous hypertension (idiopathic) with ulcer and inflammation of left lower extremity: Secondary | ICD-10-CM | POA: Insufficient documentation

## 2018-10-20 DIAGNOSIS — E1151 Type 2 diabetes mellitus with diabetic peripheral angiopathy without gangrene: Secondary | ICD-10-CM | POA: Diagnosis not present

## 2018-10-26 IMAGING — CT CT HEAD W/O CM
4 series · 17 of 47 positions shown, 19 images · non-contrast
Comparison: Maxillofacial CT dated 03/07/2016.

CLINICAL DATA: Left arm weakness. Bilateral leg weakness.
Generalized weakness.

EXAM:
CT HEAD WITHOUT CONTRAST
TECHNIQUE: Contiguous axial images were obtained from the base of the skull
through the vertex without intravenous contrast.

[Series 2: head without · axial · non-contrast · 0.42mm/px · z∈[-242,-122]mm · 7 of 33 slices shown, 9 images]
[im 5/33  brain]
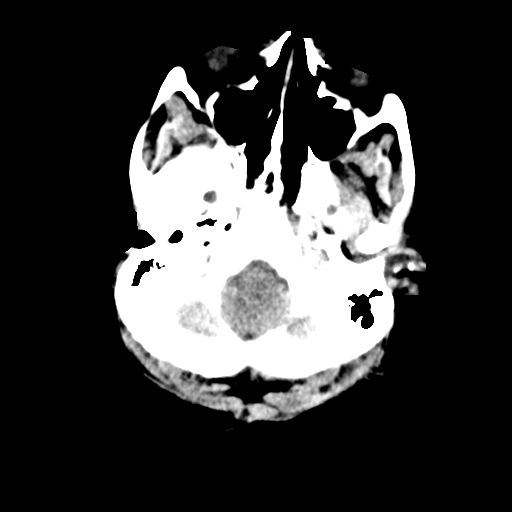
[im 5/33  bone]
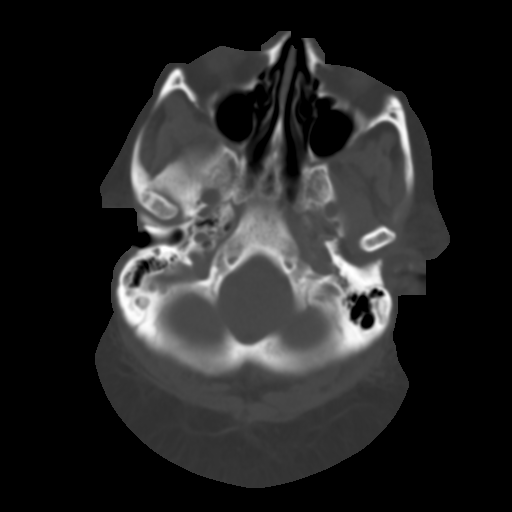
[im 9/33  brain]
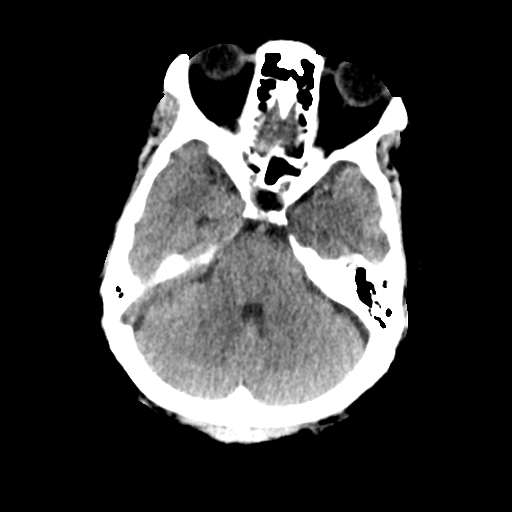
[im 13/33  brain]
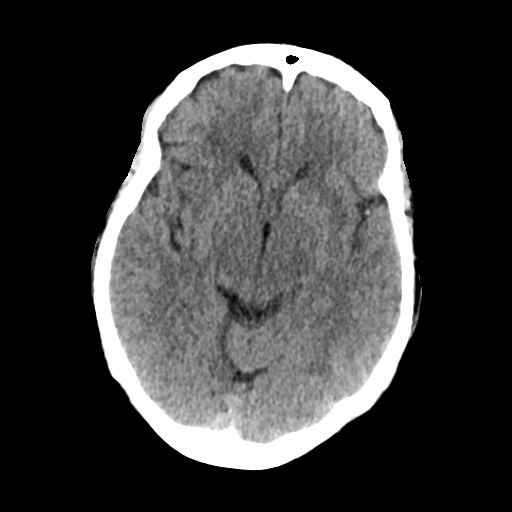
[im 17/33  brain]
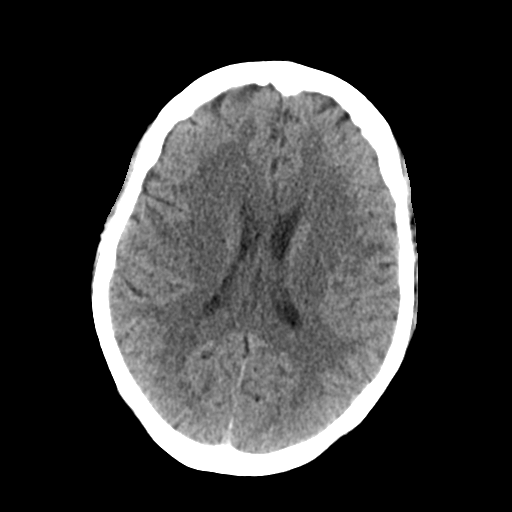
[im 21/33  brain]
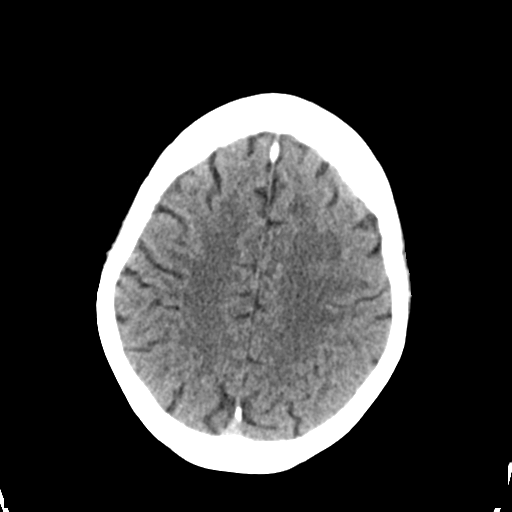
[im 21/33  bone]
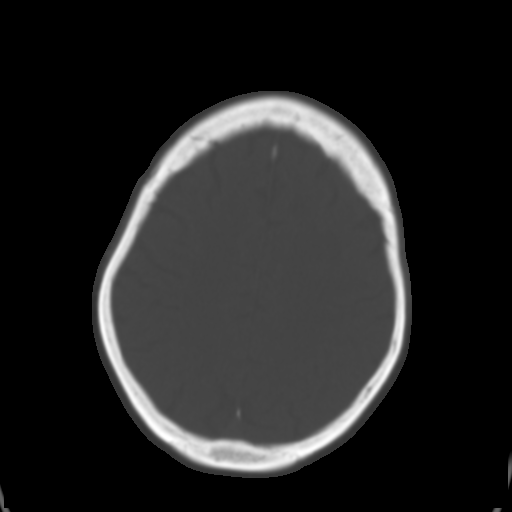
[im 25/33  brain]
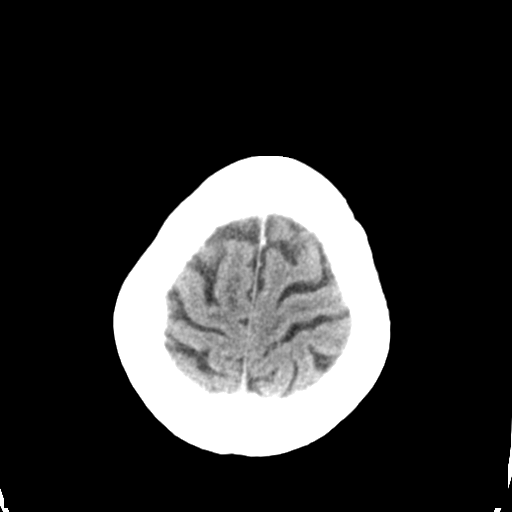
[im 29/33  brain]
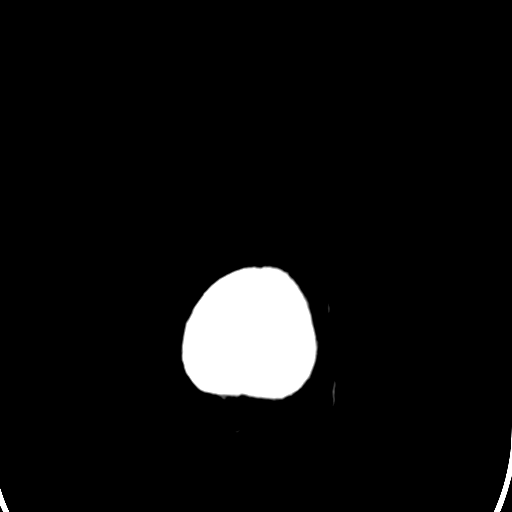

[Series 3: head bone · axial · 0.42mm/px · z∈[-246,-190]mm · 4 of 81 slices shown]
[im 9/81  bone]
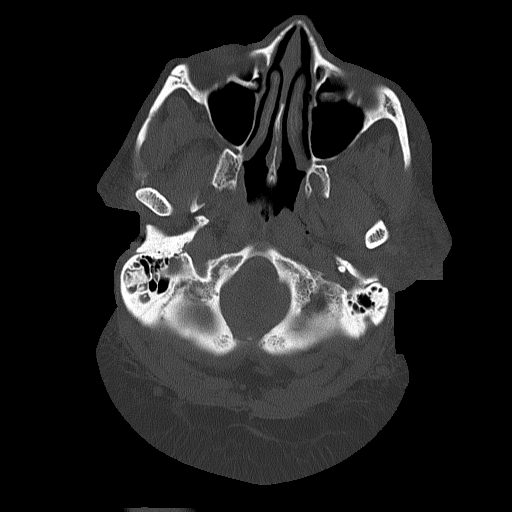
[im 17/81  bone]
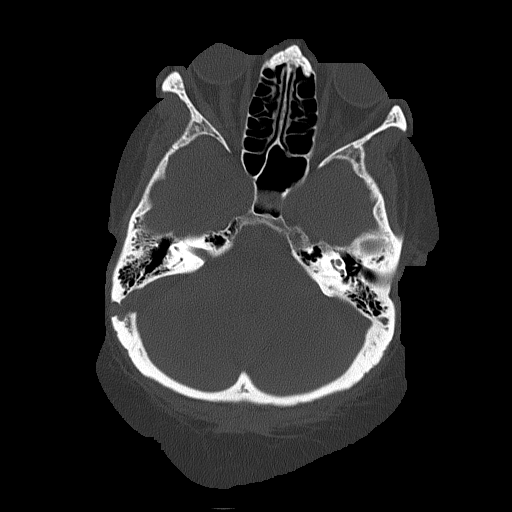
[im 25/81  bone]
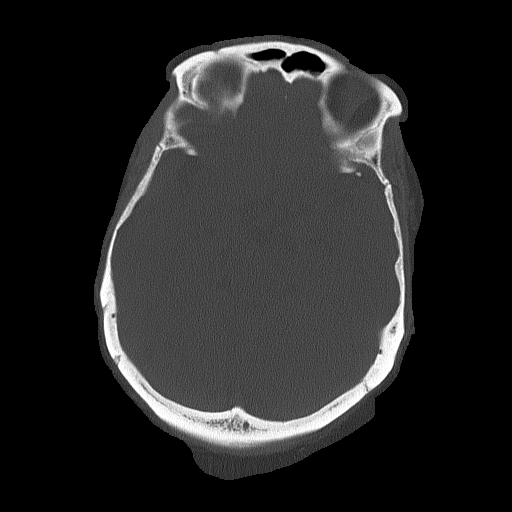
[im 37/81  bone]
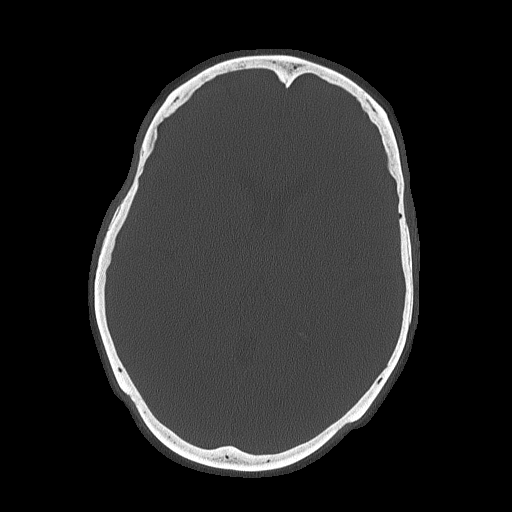

[Series 4: head without cor · coronal · non-contrast · 0.31mm/px · 3 of 70 slices shown]
[im 24/70  brain]
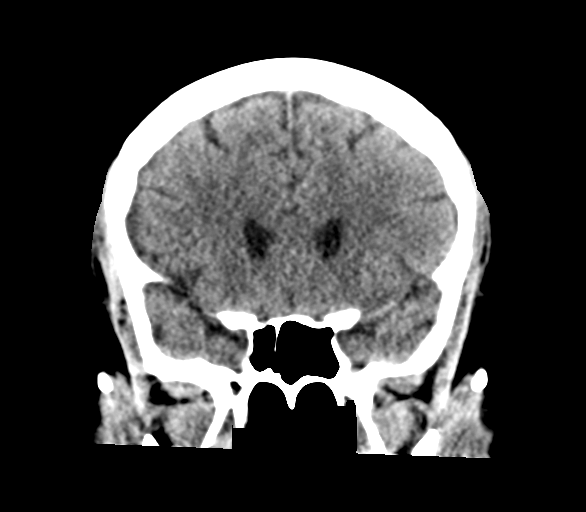
[im 31/70  brain]
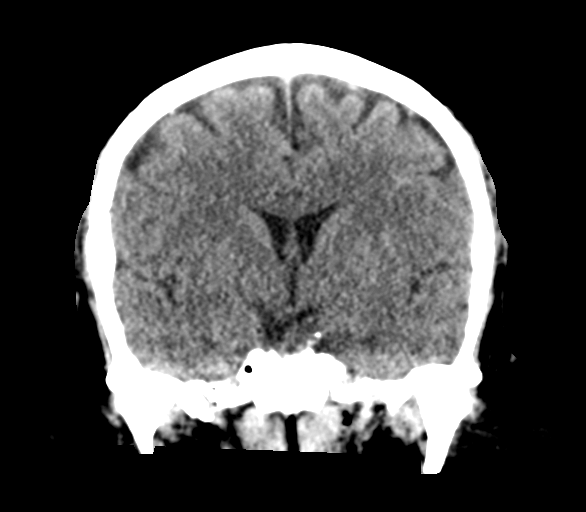
[im 39/70  brain]
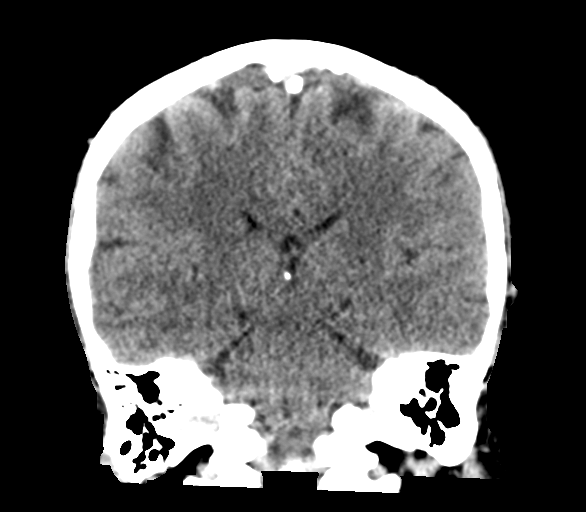

[Series 5: head without sag · sagittal · non-contrast · 0.34mm/px · 3 of 67 slices shown]
[im 23/67  brain]
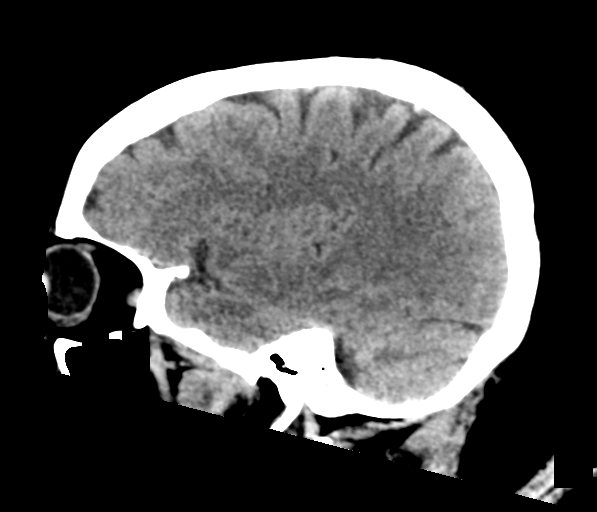
[im 34/67  brain]
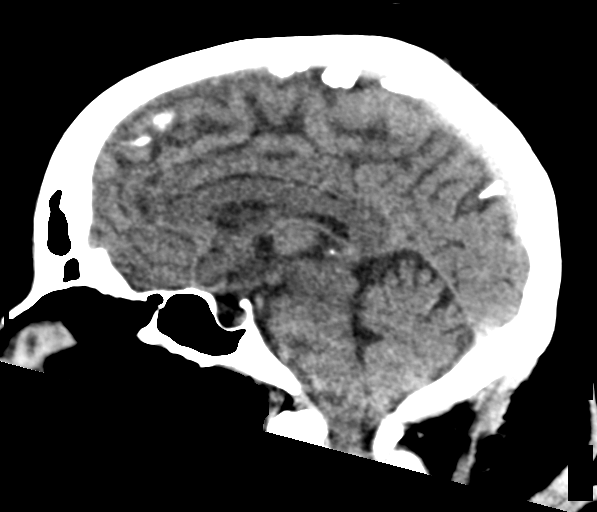
[im 45/67  brain]
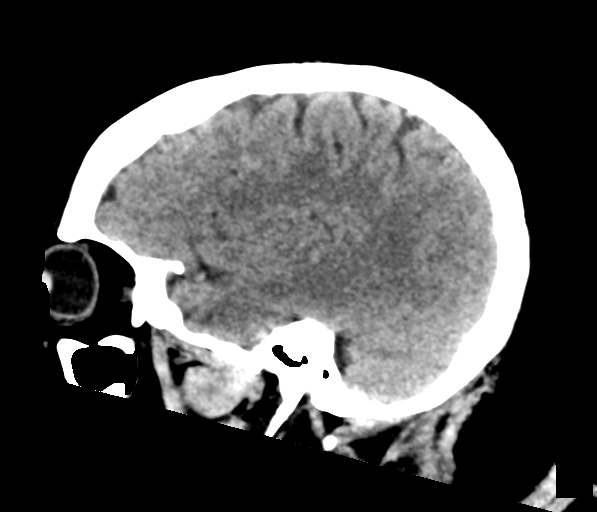

[17 of 47 positions shown; findings below may reference images not displayed]

FINDINGS: Brain: No evidence of acute infarction, hemorrhage, hydrocephalus,
extra-axial collection or mass lesion/mass effect.

Vascular: No hyperdense vessel or unexpected calcification.

Skull: Normal. Negative for fracture or focal lesion.

Sinuses/Orbits: No acute finding.

Other: None.
IMPRESSION: Normal examination.

## 2018-10-27 DIAGNOSIS — E11622 Type 2 diabetes mellitus with other skin ulcer: Secondary | ICD-10-CM | POA: Diagnosis not present

## 2018-10-27 IMAGING — MR MR HEAD W/O CM
9 of 10 series · 34 of 48 positions shown · non-contrast
Comparison: Head CT 05/30/2016

CLINICAL DATA: Generalized weakness beginning about 3 weeks ago,
progressively worsening.

EXAM:
MRI HEAD WITHOUT CONTRAST
TECHNIQUE: Multiplanar, multiecho pulse sequences of the brain and surrounding
structures were obtained without intravenous contrast.

[Series 3: FLAIR · sagittal · 5.0mm · 0.43mm/px · 3 of 24 slices shown (1 of 2)]
[im 1/24]
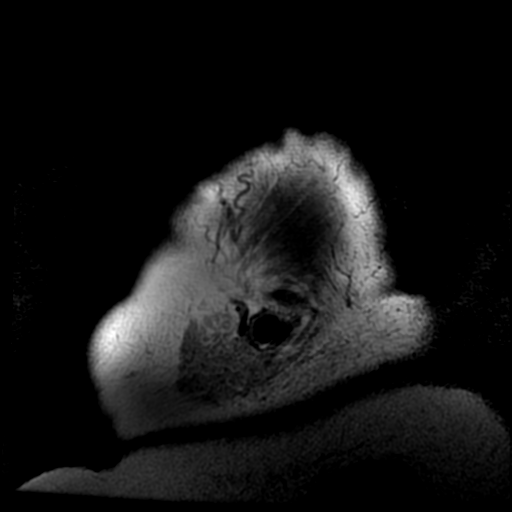
[im 12/24]
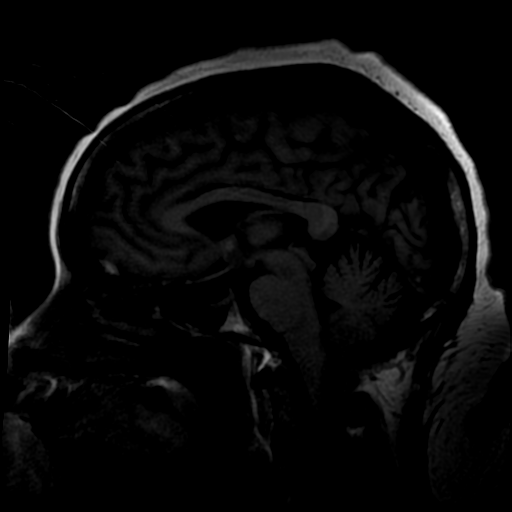
[im 24/24]
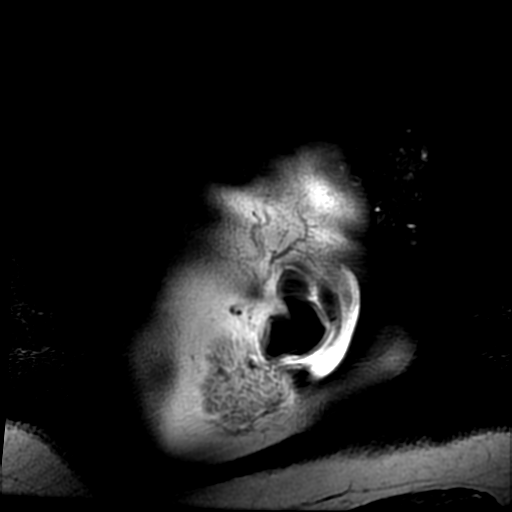

[Series 4: DWI · axial · 3.0mm · 1.02mm/px · z∈[-19,+112]mm · 8 of 89 slices shown (1 of 2)]
[im 1/89]
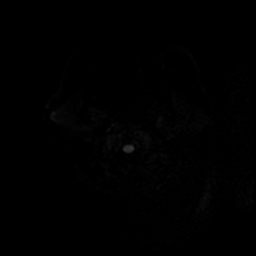
[im 10/89]
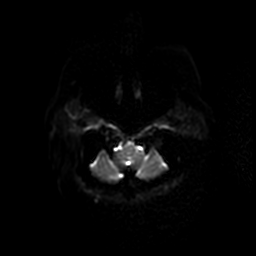
[im 30/89]
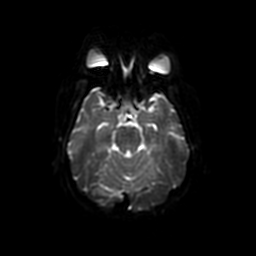
[im 40/89]
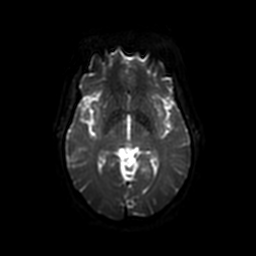
[im 49/89]
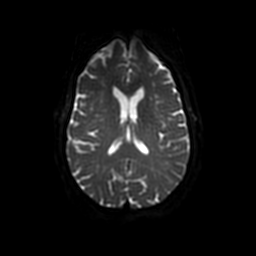
[im 59/89]
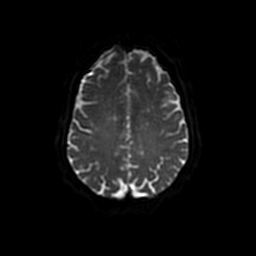
[im 79/89]
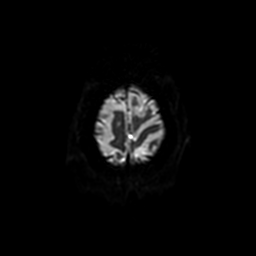
[im 89/89]
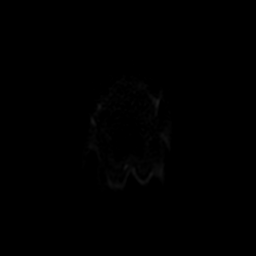

[Series 5: T2 · axial · 5.0mm · 0.45mm/px · z∈[-31,+107]mm · 2 of 24 slices shown (1 of 2)]
[im 1/24]
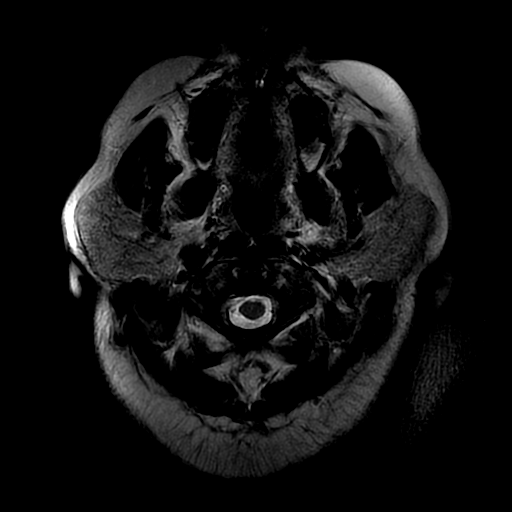
[im 24/24]
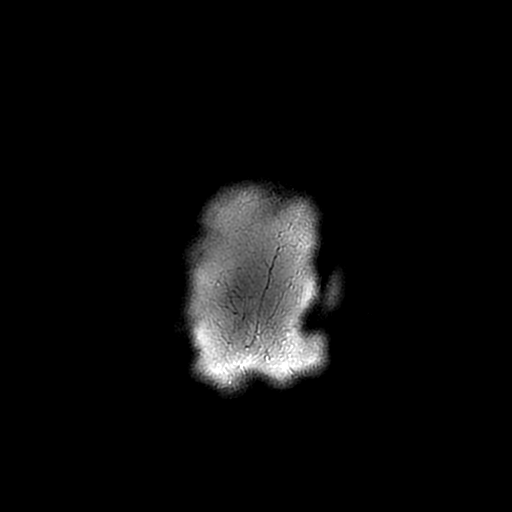

[Series 6: DWI · coronal · 5.0mm · 0.94mm/px · 6 of 59 slices shown (2 of 2)]
[im 1/59]
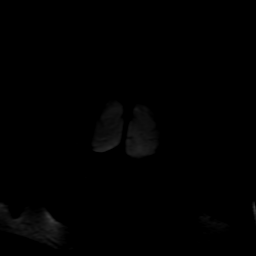
[im 12/59]
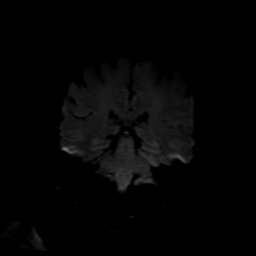
[im 24/59]
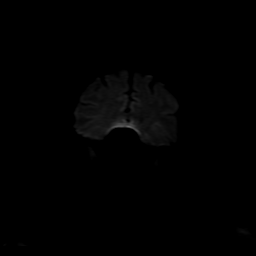
[im 35/59]
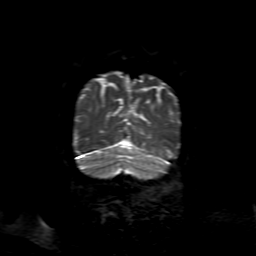
[im 47/59]
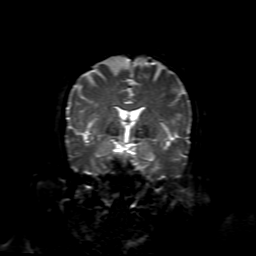
[im 59/59]
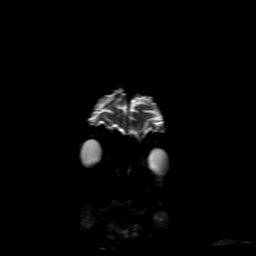

[Series 7: FLAIR · axial · 5.0mm · 0.45mm/px · z∈[-31,+107]mm · 2 of 24 slices shown (2 of 2)]
[im 1/24]
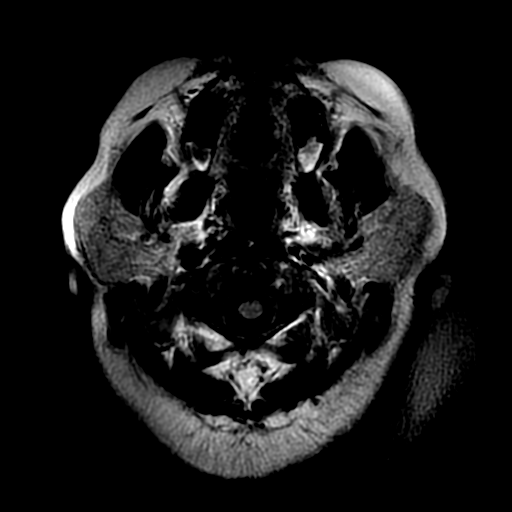
[im 24/24]
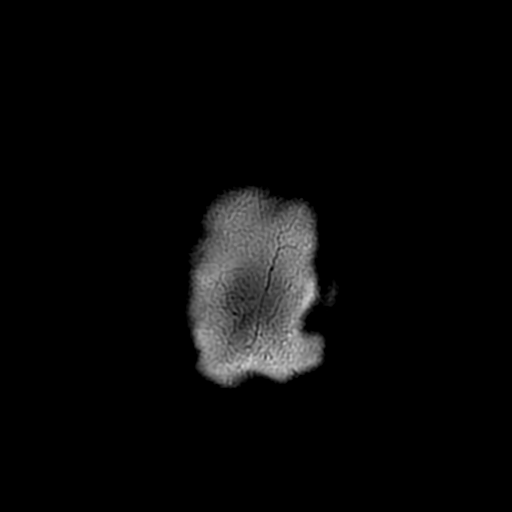

[Series 8: GRE · axial · 5.0mm · 0.47mm/px · z∈[-31,+105]mm · 3 of 26 slices shown]
[im 1/26]
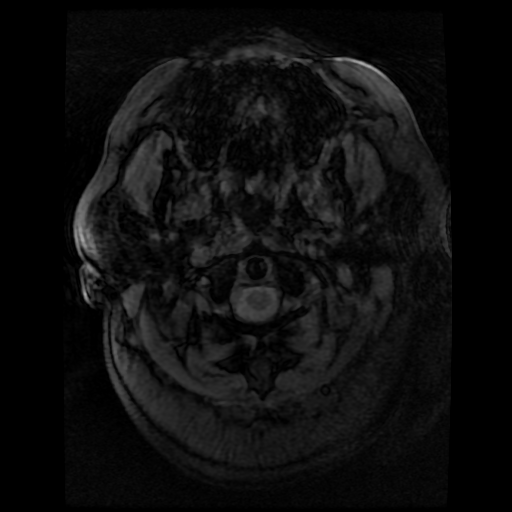
[im 13/26]
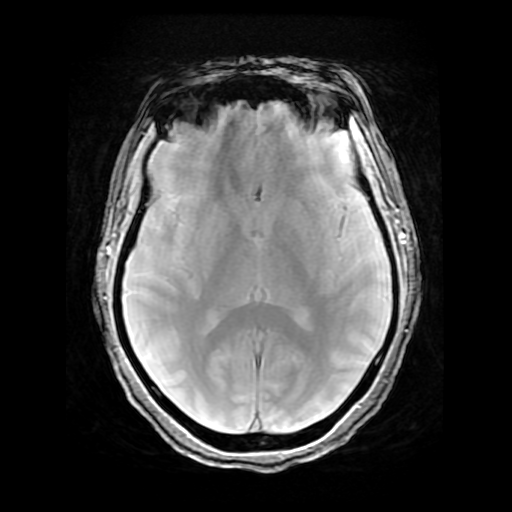
[im 26/26]
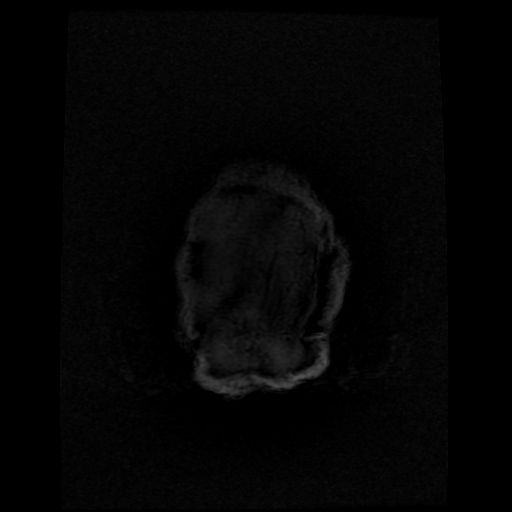

[Series 10: T2 · coronal · 5.0mm · 0.39mm/px · 3 of 25 slices shown (2 of 2)]
[im 1/25]
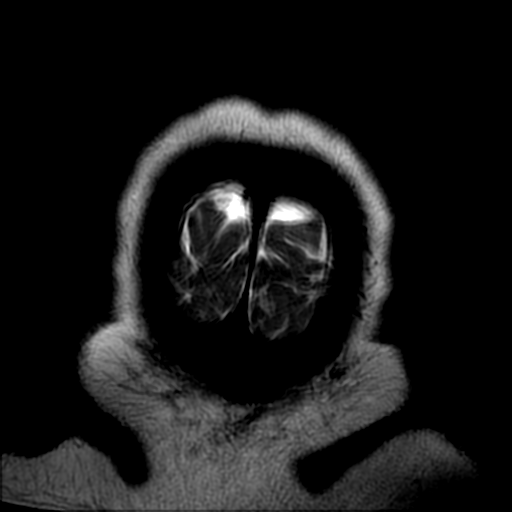
[im 13/25]
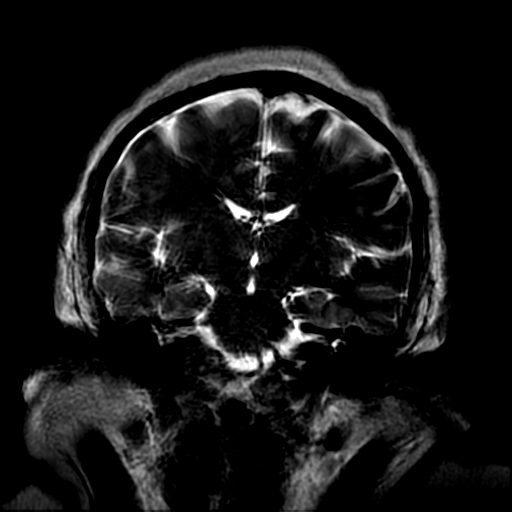
[im 25/25]
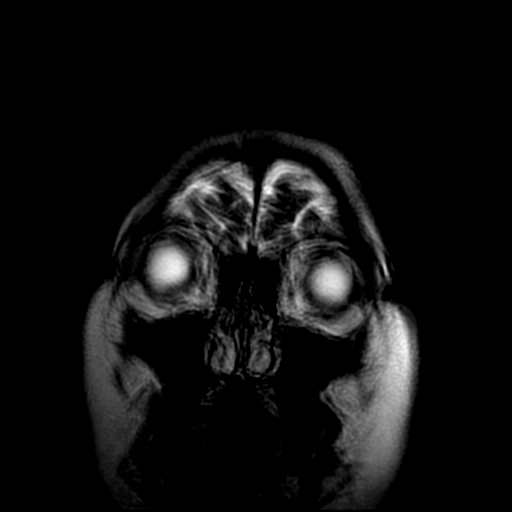

[Series 450: ADC · axial · 3.0mm · 1.02mm/px · z∈[-19,+112]mm · 4 of 43 slices shown (1 of 2)]
[im 1/43]
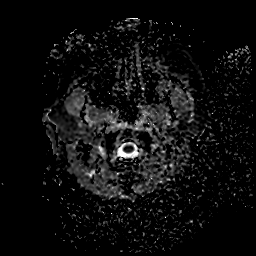
[im 15/43]
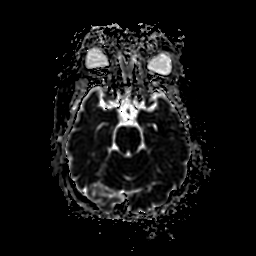
[im 29/43]
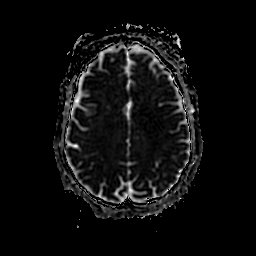
[im 43/43]
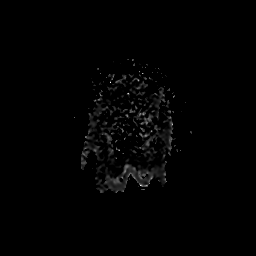

[Series 650: ADC · coronal · 5.0mm · 0.94mm/px · 3 of 30 slices shown (2 of 2)]
[im 1/30]
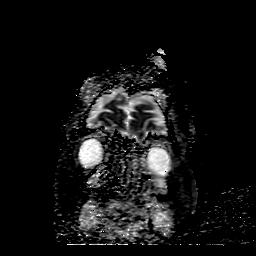
[im 15/30]
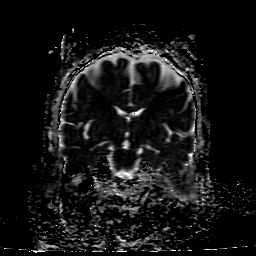
[im 30/30]
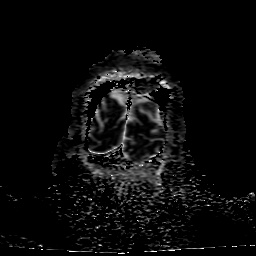

[34 of 48 positions shown; findings below may reference images not displayed]

FINDINGS: Brain: Diffusion imaging does not show any acute or subacute
infarction. There is a non acute small vessel infarction within the
right side of the pons. Exact age is indeterminate. No focal
cerebellar insult. Cerebral hemispheres show chronic small-vessel
ischemic changes within the deep and subcortical white matter, mild
moderate in degree. No cortical or large vessel territory
infarction. No mass lesion, hemorrhage, hydrocephalus or extra-axial
collection. No pituitary mass.

Vascular: Major vessels at the base of the brain show flow.

Skull and upper cervical spine: Negative

Sinuses/Orbits: Clear/normal

Other: None significant
IMPRESSION: No acute finding by MRI. Mild to moderate changes of chronic small
vessel disease affecting the cerebral hemispheric white matter.
Nonacute small vessel infarction right pons.

## 2018-11-03 DIAGNOSIS — E11622 Type 2 diabetes mellitus with other skin ulcer: Secondary | ICD-10-CM | POA: Diagnosis not present

## 2018-11-17 ENCOUNTER — Other Ambulatory Visit: Payer: Self-pay

## 2018-11-17 ENCOUNTER — Encounter (HOSPITAL_BASED_OUTPATIENT_CLINIC_OR_DEPARTMENT_OTHER): Payer: Medicare PPO | Attending: Physician Assistant

## 2018-11-17 DIAGNOSIS — I890 Lymphedema, not elsewhere classified: Secondary | ICD-10-CM | POA: Diagnosis not present

## 2018-11-17 DIAGNOSIS — I87333 Chronic venous hypertension (idiopathic) with ulcer and inflammation of bilateral lower extremity: Secondary | ICD-10-CM | POA: Diagnosis not present

## 2018-11-17 DIAGNOSIS — L97512 Non-pressure chronic ulcer of other part of right foot with fat layer exposed: Secondary | ICD-10-CM | POA: Insufficient documentation

## 2018-11-17 DIAGNOSIS — Z794 Long term (current) use of insulin: Secondary | ICD-10-CM | POA: Diagnosis not present

## 2018-11-17 DIAGNOSIS — L97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 leg with fat layer exposed: Secondary | ICD-10-CM | POA: Diagnosis not present

## 2018-11-17 DIAGNOSIS — L97212 Non-pressure chronic ulcer of right calf with fat layer exposed: Secondary | ICD-10-CM | POA: Insufficient documentation

## 2018-11-17 DIAGNOSIS — L97222 Non-pressure chronic ulcer of left calf with fat layer exposed: Secondary | ICD-10-CM | POA: Diagnosis not present

## 2018-11-17 DIAGNOSIS — Z6841 Body Mass Index (BMI) 40.0 and over, adult: Secondary | ICD-10-CM | POA: Diagnosis not present

## 2018-11-17 DIAGNOSIS — E11622 Type 2 diabetes mellitus with other skin ulcer: Secondary | ICD-10-CM | POA: Diagnosis not present

## 2018-11-17 DIAGNOSIS — L97522 Non-pressure chronic ulcer of other part of left foot with fat layer exposed: Secondary | ICD-10-CM | POA: Insufficient documentation

## 2018-11-17 DIAGNOSIS — I10 Essential (primary) hypertension: Secondary | ICD-10-CM | POA: Diagnosis not present

## 2018-11-17 DIAGNOSIS — L97822 Non-pressure chronic ulcer of other part of left lower leg with fat layer exposed: Secondary | ICD-10-CM | POA: Diagnosis present

## 2018-11-17 DIAGNOSIS — E1151 Type 2 diabetes mellitus with diabetic peripheral angiopathy without gangrene: Secondary | ICD-10-CM | POA: Insufficient documentation

## 2018-11-17 DIAGNOSIS — L97312 Non-pressure chronic ulcer of right ankle with fat layer exposed: Secondary | ICD-10-CM | POA: Diagnosis not present

## 2018-11-24 DIAGNOSIS — E11622 Type 2 diabetes mellitus with other skin ulcer: Secondary | ICD-10-CM | POA: Diagnosis not present

## 2018-12-01 DIAGNOSIS — E11622 Type 2 diabetes mellitus with other skin ulcer: Secondary | ICD-10-CM | POA: Diagnosis not present

## 2018-12-08 DIAGNOSIS — E11622 Type 2 diabetes mellitus with other skin ulcer: Secondary | ICD-10-CM | POA: Diagnosis not present

## 2018-12-14 ENCOUNTER — Other Ambulatory Visit (HOSPITAL_COMMUNITY): Payer: Self-pay | Admitting: Physician Assistant

## 2018-12-14 DIAGNOSIS — L97909 Non-pressure chronic ulcer of unspecified part of unspecified lower leg with unspecified severity: Secondary | ICD-10-CM

## 2018-12-15 ENCOUNTER — Other Ambulatory Visit: Payer: Self-pay

## 2018-12-15 ENCOUNTER — Ambulatory Visit (HOSPITAL_COMMUNITY)
Admission: RE | Admit: 2018-12-15 | Discharge: 2018-12-15 | Disposition: A | Discharge status: Home / Self Care | Payer: Medicare PPO | Source: Ambulatory Visit | Attending: Family | Admitting: Family

## 2018-12-15 DIAGNOSIS — L97909 Non-pressure chronic ulcer of unspecified part of unspecified lower leg with unspecified severity: Secondary | ICD-10-CM | POA: Insufficient documentation

## 2018-12-22 ENCOUNTER — Other Ambulatory Visit: Payer: Self-pay

## 2018-12-22 ENCOUNTER — Encounter (HOSPITAL_BASED_OUTPATIENT_CLINIC_OR_DEPARTMENT_OTHER): Payer: Medicare PPO | Attending: Physician Assistant

## 2018-12-22 DIAGNOSIS — I890 Lymphedema, not elsewhere classified: Secondary | ICD-10-CM | POA: Insufficient documentation

## 2018-12-22 DIAGNOSIS — L97312 Non-pressure chronic ulcer of right ankle with fat layer exposed: Secondary | ICD-10-CM | POA: Insufficient documentation

## 2018-12-22 DIAGNOSIS — L97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 leg with fat layer exposed: Secondary | ICD-10-CM | POA: Diagnosis not present

## 2018-12-22 DIAGNOSIS — Z6841 Body Mass Index (BMI) 40.0 and over, adult: Secondary | ICD-10-CM | POA: Insufficient documentation

## 2018-12-22 DIAGNOSIS — L97512 Non-pressure chronic ulcer of other part of right foot with fat layer exposed: Secondary | ICD-10-CM | POA: Diagnosis not present

## 2018-12-22 DIAGNOSIS — L97522 Non-pressure chronic ulcer of other part of left foot with fat layer exposed: Secondary | ICD-10-CM | POA: Diagnosis not present

## 2018-12-22 DIAGNOSIS — I87333 Chronic venous hypertension (idiopathic) with ulcer and inflammation of bilateral lower extremity: Secondary | ICD-10-CM | POA: Diagnosis not present

## 2018-12-22 DIAGNOSIS — L97322 Non-pressure chronic ulcer of left ankle with fat layer exposed: Secondary | ICD-10-CM | POA: Insufficient documentation

## 2018-12-22 DIAGNOSIS — L97212 Non-pressure chronic ulcer of right calf with fat layer exposed: Secondary | ICD-10-CM | POA: Insufficient documentation

## 2018-12-22 DIAGNOSIS — E11621 Type 2 diabetes mellitus with foot ulcer: Secondary | ICD-10-CM | POA: Insufficient documentation

## 2018-12-22 DIAGNOSIS — E119 Type 2 diabetes mellitus without complications: Secondary | ICD-10-CM | POA: Diagnosis not present

## 2018-12-22 DIAGNOSIS — I10 Essential (primary) hypertension: Secondary | ICD-10-CM | POA: Diagnosis not present

## 2019-01-05 DIAGNOSIS — E11621 Type 2 diabetes mellitus with foot ulcer: Secondary | ICD-10-CM | POA: Diagnosis not present

## 2019-01-12 DIAGNOSIS — E11621 Type 2 diabetes mellitus with foot ulcer: Secondary | ICD-10-CM | POA: Diagnosis not present

## 2019-01-19 ENCOUNTER — Encounter (HOSPITAL_BASED_OUTPATIENT_CLINIC_OR_DEPARTMENT_OTHER): Payer: Medicare PPO | Attending: Internal Medicine

## 2019-01-19 DIAGNOSIS — L97322 Non-pressure chronic ulcer of left ankle with fat layer exposed: Secondary | ICD-10-CM | POA: Insufficient documentation

## 2019-01-19 DIAGNOSIS — E11621 Type 2 diabetes mellitus with foot ulcer: Secondary | ICD-10-CM | POA: Diagnosis not present

## 2019-01-19 DIAGNOSIS — Z794 Long term (current) use of insulin: Secondary | ICD-10-CM | POA: Insufficient documentation

## 2019-01-19 DIAGNOSIS — L97522 Non-pressure chronic ulcer of other part of left foot with fat layer exposed: Secondary | ICD-10-CM | POA: Insufficient documentation

## 2019-01-19 DIAGNOSIS — L97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 leg with fat layer exposed: Secondary | ICD-10-CM | POA: Diagnosis not present

## 2019-01-19 DIAGNOSIS — I10 Essential (primary) hypertension: Secondary | ICD-10-CM | POA: Diagnosis not present

## 2019-01-19 DIAGNOSIS — L97312 Non-pressure chronic ulcer of right ankle with fat layer exposed: Secondary | ICD-10-CM | POA: Insufficient documentation

## 2019-01-19 DIAGNOSIS — Z6841 Body Mass Index (BMI) 40.0 and over, adult: Secondary | ICD-10-CM | POA: Insufficient documentation

## 2019-01-26 ENCOUNTER — Other Ambulatory Visit: Payer: Self-pay | Admitting: Family

## 2019-01-26 DIAGNOSIS — Z13820 Encounter for screening for osteoporosis: Secondary | ICD-10-CM

## 2019-01-26 DIAGNOSIS — E11621 Type 2 diabetes mellitus with foot ulcer: Secondary | ICD-10-CM | POA: Diagnosis not present

## 2019-02-02 DIAGNOSIS — E11621 Type 2 diabetes mellitus with foot ulcer: Secondary | ICD-10-CM | POA: Diagnosis not present

## 2019-02-09 DIAGNOSIS — E11621 Type 2 diabetes mellitus with foot ulcer: Secondary | ICD-10-CM | POA: Diagnosis not present

## 2019-02-22 ENCOUNTER — Other Ambulatory Visit: Payer: Self-pay

## 2019-02-23 ENCOUNTER — Encounter (HOSPITAL_BASED_OUTPATIENT_CLINIC_OR_DEPARTMENT_OTHER): Payer: Medicare PPO | Attending: Physician Assistant

## 2019-02-23 ENCOUNTER — Other Ambulatory Visit: Payer: Self-pay

## 2019-02-23 DIAGNOSIS — I890 Lymphedema, not elsewhere classified: Secondary | ICD-10-CM | POA: Insufficient documentation

## 2019-02-23 DIAGNOSIS — L97522 Non-pressure chronic ulcer of other part of left foot with fat layer exposed: Secondary | ICD-10-CM | POA: Insufficient documentation

## 2019-02-23 DIAGNOSIS — I10 Essential (primary) hypertension: Secondary | ICD-10-CM | POA: Diagnosis not present

## 2019-02-23 DIAGNOSIS — Z6841 Body Mass Index (BMI) 40.0 and over, adult: Secondary | ICD-10-CM | POA: Insufficient documentation

## 2019-02-23 DIAGNOSIS — E11621 Type 2 diabetes mellitus with foot ulcer: Secondary | ICD-10-CM | POA: Insufficient documentation

## 2019-02-23 DIAGNOSIS — E11622 Type 2 diabetes mellitus with other skin ulcer: Secondary | ICD-10-CM | POA: Insufficient documentation

## 2019-02-23 DIAGNOSIS — Z794 Long term (current) use of insulin: Secondary | ICD-10-CM | POA: Diagnosis not present

## 2019-02-23 DIAGNOSIS — L97322 Non-pressure chronic ulcer of left ankle with fat layer exposed: Secondary | ICD-10-CM | POA: Diagnosis not present

## 2019-03-09 DIAGNOSIS — E11622 Type 2 diabetes mellitus with other skin ulcer: Secondary | ICD-10-CM | POA: Diagnosis not present

## 2019-03-16 ENCOUNTER — Ambulatory Visit: Payer: Medicare PPO

## 2019-03-23 ENCOUNTER — Encounter (HOSPITAL_BASED_OUTPATIENT_CLINIC_OR_DEPARTMENT_OTHER): Payer: Medicare PPO | Attending: Physician Assistant | Admitting: Physician Assistant

## 2019-03-23 ENCOUNTER — Other Ambulatory Visit: Payer: Self-pay

## 2019-03-23 DIAGNOSIS — I87332 Chronic venous hypertension (idiopathic) with ulcer and inflammation of left lower extremity: Secondary | ICD-10-CM | POA: Insufficient documentation

## 2019-03-23 DIAGNOSIS — L97522 Non-pressure chronic ulcer of other part of left foot with fat layer exposed: Secondary | ICD-10-CM | POA: Insufficient documentation

## 2019-03-23 DIAGNOSIS — Z6841 Body Mass Index (BMI) 40.0 and over, adult: Secondary | ICD-10-CM | POA: Insufficient documentation

## 2019-03-23 DIAGNOSIS — L97322 Non-pressure chronic ulcer of left ankle with fat layer exposed: Secondary | ICD-10-CM | POA: Diagnosis not present

## 2019-03-23 DIAGNOSIS — F418 Other specified anxiety disorders: Secondary | ICD-10-CM | POA: Insufficient documentation

## 2019-03-23 DIAGNOSIS — I10 Essential (primary) hypertension: Secondary | ICD-10-CM | POA: Insufficient documentation

## 2019-03-23 DIAGNOSIS — R531 Weakness: Secondary | ICD-10-CM | POA: Insufficient documentation

## 2019-03-23 DIAGNOSIS — E11622 Type 2 diabetes mellitus with other skin ulcer: Secondary | ICD-10-CM | POA: Insufficient documentation

## 2019-03-23 DIAGNOSIS — I890 Lymphedema, not elsewhere classified: Secondary | ICD-10-CM | POA: Diagnosis not present

## 2019-03-23 DIAGNOSIS — E1151 Type 2 diabetes mellitus with diabetic peripheral angiopathy without gangrene: Secondary | ICD-10-CM | POA: Insufficient documentation

## 2019-03-23 NOTE — Progress Notes (Addendum)
KEVINA, PILOTO ([id number]) Visit Report for 03/23/2019 Chief Complaint Document Details Patient Name: Date of Service: Jordana, Dugue 03/23/2019 10:00 AM Medical Record [id number] Patient Account Number: [id number] Date of Birth/Sex: Treating RN: Feb 08, 1951 (68 y.o. [name] Primary Care Provider: [name] Other Clinician: Referring Provider: Treating Provider/Extender:Stone III, [name], FRED Weeks in Treatment: 70 Information Obtained from: Patient Chief Complaint Bilateral reoccurring LE ulcers Electronic Signature(s) Signed: 03/23/2019 11:08:45 AM By: [name] PA-C Entered By: [name] on 03/23/2019 11:08:44 -------------------------------------------------------------------------------- HPI Details Patient Name: Date of Service: [name] 03/23/2019 10:00 AM Medical Record [id number] Patient Account Number: [id number] Date of Birth/Sex: Treating RN: [birth year] (67 y.o. [name] Primary Care Provider: [name] Other Clinician: Referring Provider: Treating Provider/Extender:Stone III, [name], FRED Weeks in Treatment: 79 History of Present Illness HPI Description: this patient has been seen a couple of times before and returns with recurrent problems to her right and left lower extremity with swelling and weeping ulcerations due to not wearing her compression stockings which she had been advised to do during her last discharge, at the end of June 2018. During her last visit the patient had had normal arterial blood flow and her venous reflux study did not necessitate any surgical intervention. She was recommended compression and elevation and wound care. After prolonged treatment the patient was completely healed but she has been noncompliant with wearing or compressions.. She was here last week with an outpatient return visit planned but the patient came in a very poor general condition with altered mental  status and was rushed to the ER on my request. With a history of hypertension, diabetes, TIA and right-sided weakness she was set up for an MRI on her brain and cervical spine and was sent to [facility]. Getting an MRI done was very difficult but once the workup was done she was found not to have any spinal stenosis, epidural abscess or hematoma or discitis. This was radiculopathy to be treated as an outpatient and she was given a follow-up appointment. Today she is feeling much better alert and oriented and has come to reevaluate her bilateral lower extremity lymphedema and ulceration 03/25/2017 -- she was admitted to the hospital on 03/16/2017 and discharged on 03/18/2017 with left leg cellulitis and ulceration. She was started on vancomycin and Zosyn and x-ray showed no bony involvement. She was treated for a cellulitis with IV antibiotics changed to Rocephin and Flagyl and was discharged on oral Keflex and doxycycline to complete a 7 day course. Last hemoglobin A1c was 7.1 and her other ailments including hypertension got asthma were appropriately treated. 05/06/2017 -- she is awaiting the right size of compression stockings from  but other than that has been doing well. ====== Old notes 68 year old patient was seen one time last October and was lost to follow-up. She has recurrent problems with weeping and ulceration of her left lower extremity and has swelling of this for several years. It has been worse for the last 2 months. Past medical history is significant for diabetes mellitus type 2, hypertension, gout, morbid obesity, depressive disorders, hiatal hernia, migraines, status post knee surgery, risk of a cholecystectomy, vaginal hysterectomy and breast biopsy. She is not a smoker. As noted before she has never had a venous duplex study and an arterial ABI study was attempted but the left lower extremity was noncompressible 10/01/2016 -- had a lower extremity venous duplex  reflux evaluation which showed no evidence of deep vein reflux in  the right or left lower extremity, and no evidence of great saphenous vein reflux more than 500 ms in the right or left lower extremity, and the left small saphenous vein is incompetent but no vascular consult was recommended. review of her electronic medical records noted that the ABI was checked in July 2017 where the right ABI was normal limits and the left ABI could not be ascertained due to pain with cuff pressure but the waveforms are within normal limits. her arterial duplex study scheduled for April 27. 10/08/2016 -- the patient has various reasons for not having a compression on and for the last 3 days she has had no compression on her left lower extremity either due to pain or the lack of nursing help. She does not use her juxta lites either. 10/15/2016 -- the patient did not keep her appointment for arterial duplex study on April 27 and I have asked her to reschedule this. Her pain is out of proportion with the physical findings and she continuously fails to wear a compression wraps and cuts them off because she says she cannot tolerate the pain. She does not use her juxta lites either. 10/22/2016 -- he has rescheduled her arterial duplex study to May 21 and her pain today is a bit better. She has not been wearing her juxta lites on her right lower extremity but now understands that she needs to do this. She did tolerate the to press compression wrap on her left lower extremity 10/29/2016 --arterial duplex study is scheduled for next week and overall she has been tolerating her compression wraps and also using her juxta lites on her right lower extremity 11/05/2016 -- the right ABI was 0.95 the left was 1.03. The digit TBI is on the right was 0.83 on the left was 0.92 and she had biphasic flow through these vessels. The impression was that of normal lower extremity arterial study. 11/12/2016 -- her pain is minimal and  she is doing very well overall. 11/26/2016 -- she has got juxta lites and her insurance will not pay for additional dual layer compression stockings. She is going to order some from [place]. 05/12/2017 -- her juxta lites are very old and too big for her and these have not been helping with compression. She did get 20-30 mm compression stockings from [facility] but she and her husband are unable to put these on. I believe she will benefit from bilateral Extremit-ease, compression stockings and we will measure her for these today. 05/20/2017 -- lymphedema on the left lower extremity has increased a lot and she has a open ulceration as a result of this. The right lower extremity is looking pretty good. She has decided to by the compression stockings herself and will get reimbursed by the home health, at a later date. 05/27/2017 -- her sciatica is bothering her a lot and she thought her left leg pain was caused due to the compression wrap and hence removed it and has significant lymphedema. There is no inflammation on this left lower extremity. 06/17/17 on evaluation today patient appears to be doing very well and in fact is completely healed in regard to her ulcerations. Unfortunately however she does have continued issues with lymphedema nonetheless. We did order compression garments for her unfortunately she states that the size that she received were large although we ordered medium. Obviously this means she is not getting the optimal compression. She does not have those with her today and therefore we could not confirm and contact the  company on her behalf. Nonetheless she does state that she is going to have her husband bring them by tomorrow so that we can verify and then get in touch with the company. No fevers, chills, nausea, or vomiting noted at this time. Overall patient is doing better otherwise and I'm pleased with the progress she has made. 07/01/17 on evaluation today patient appears to  be doing very well in regard to her bilateral lower extremity she does not have any openings at this point which is excellent news. Overall I'm pleased with how things have progressed up to this time. Since she is doing so well we did order her compression which we are seeing her today to ensure that it fits her properly and everything is doing well in that regard and then subsequently she will be discharged. ============ Old Notes: 03/31/16 patient presents today for evaluation concerning open wounds that she has over the left medial ankle region as well as the left dorsal foot. She has previously had this occur although it has been healed for a number of months after having this for about a year prior until her hospitalization on 01/05/16. At that point in time it appears that she was admitted to the hospital for left lower extremity cellulitis and was placed on vancomycin and Zosyn at that point. Eventually upon discharge on January 15, 2016 she was placed on doxycycline at that point in time. Later on 03/27/16 positive wound culture growing Escherichia coli this was switched to amoxicillin. Currently she tells me that she is having pain radiated to be a 7 out of 10 which can be as high as 10 out of 10 with palpation and manipulation of the wound. This wound appears to be mainly venous in nature due to the bilateral lower extremity venous stasis/lymphedema. This is definitely much worse on her left than the right side. She does have type 1 diabetes mellitus, hypertension, morbid obesity, and is wheelchair dependent.during the course of the hospital stay a blood culture was also obtained and fortunately appeared negative. She also had an x-ray of the tibia/fibula on the left which showed no acute bone abnormality. Her white blood cell count which was performed last on 03/25/16 was 7.3, hemoglobin 12.8, protein 7.1, albumin 3.0. Her urine culture appeared to be negative for any specific  organisms. Patient did have a left lower extremity venous duplex evaluation for DVT. This did not include venous reflux studies but fortunately was negative for DVT. Patient also had arterial studies performed which revealed that she had a normal ABI on the right though this was unable to be performed on the left secondary to pain that she was having around the ankle region due to the wound. However it was stated on report that she had biphasic pulses and apparently good blood flow. ========== 06/03/17 she is here in follow-up evaluation for right lower extremity ulcer. The right lower sure he has healed but she has reopened to the left medial malleolus and dorsal foot with weeping. She is waiting for new compression garments to arrive from home health, the previous compression garments were ill fitting. We will continue with compression bilaterally and follow-up in 2 weeks Readmission: 08/05/17 on evaluation today patient appears to be doing somewhat poorly in regard to her left lower extremity especially although the right lower extremity has a small area which may no longer be open. She has been having a lot of drainage from the left lower extremity however he tells me that she  has not been able to use the EXTREMIT- EASE Compression at this point. She states that she did better and was able to actually apply the Juxta-Lite compression although the wound that she has is too large and therefore really does not compress which is why she cannot wear it at this point. She has no one who can help her put it on regular basis her son can sometimes but he's not able to do it most of the time. I do believe that's why she has begun to weave and have issues as she is currently yet again. No fevers, chills, nausea, or vomiting noted at this time. Patient is no evidence of dementia. 08/12/17 on evaluation today patient appears to still be doing fairly well in regard to the draining areas/weeping areas at  this point. With that being said she unfortunately did go to the ER yesterday due to what was felt to be possibly a cellulitis. They place her on doxycycline by mouth and discharge her home. She definitely was not admitted. With that being said she states she has had more discomfort which has been unusual for her even compared to prior times and she's had infections.08/12/17 on evaluation today patient appears to still be doing fairly well in regard to the draining areas/weeping areas at this point. With that being said she unfortunately did go to the ER yesterday due to what was felt to be possibly a cellulitis. They place her on doxycycline by mouth and discharge her home. She definitely was not admitted. With that being said she states she has had more discomfort which has been unusual for her even compared to prior times and she's had infections. 08/19/17 put evaluation today patient tells me that she's been having a lot of what sounds to be neuropathic type pain in regard to her left lower extremity. She has been using over-the-counter topical bins again which some believe. That in order to apply the she actually remove the wrap we put on her last Wednesday on Thursday. Subsequently she has not had anything on compression wise since that time. The good news is a lot of the weeping areas appear to have closed at this point again I believe she would do better with compression but we are struggling to get her to actually use what she needs to at this point. No fevers, chills, nausea, or vomiting noted at this time. 09/03/17 on evaluation today patient appears to be doing okay in regard to her lower extremities in regard to the lymphedema and weeping. Fortunately she does not seem to show any signs of infection at this point she does have a little bit of weeping occurring in the right medial malleolus area. With that being said this does not appear to be too significant which is good news. 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 She has severe skin damage secondary to both of these features involving the dorsal left foot and medial left ankle and lower leg. Still has open areas in the left anterior foot. The area on the right closed over. She uses her own juxta light stockings. She does not have an arterial issue 09/16/17 on evaluation today patient actually appears to be doing excellent in regard to her bilateral lower extremity swelling. The Juxta-Lite compression wrap seem to be doing very well for her. She has not however been using the portion that goes over her foot. Her left foot still is draining a little  bit not nearly as significant as it has been in the past but still I do believe that she likely needs to utilize the full wrap including the foot portion of this will improve as well. She also has been apparently putting on a significant amount of Vaseline which also think is not helpful for her. I recommended that if she feels she needs something for moisturizer Eucerin will probably be better. 09/30/17 on evaluation today patient presents with several new open areas in regard to her left lower extremity although these appear to be minimal and mainly seem to be more moisture breakdown than anything. Fortunately she does not seem to have any evidence of infection which is great news. She has been tolerating the dressing changes without complication we are using silver alginate on the foot she has been using AB pads to have the legs and using her Juxta-Lite compression which seems to be controlling her swelling very well. Overall I'm pleased with the poor way she has progressed. 10/14/17 on evaluation today patient appears to be doing better in regard to her left lower extremity areas of weeping. She does still have some discomfort although in general this does not appear to be as macerated and I think it  is progressing nicely. I do think she still needs to wear the foot portion of her Juxta-Lite in order to get the most benefit from the wrap obviously. She states she understands. Fortunately there does not appear to be evidence of infection at this time which is great news. 10/28/17 on evaluation today patient appears to be doing excellent in regard to her left lower extremity. She has just a couple areas that are still open and seem to be causing any trouble whatsoever. For that reason I think that she is definitely headed in the right direction the spots are very tiny compared to what we have been dealing with in the past. 11/11/17 on evaluation today patient appears to have a right lateral lower extremity ulcer that has opened since I last saw her. She states this is where the home health nurse that was coming out remove the dressing without wetting the alginate first. Nonetheless I do not know if this is indeed the case or not but more importantly we have not ordered home help to be coming out for her wounds at all. I'm unsure as to why they are coming out and we're gonna have to check on this and get things situated in that regard. With that being said we currently really do not need them to be coming out as the patient has been taking care of her leg herself without complication and no issues. In fact she was doing much better prior to nursing coming out. 11/25/17 on evaluation today patient actually appears to be doing fairly well in regard to her left lower extremity swelling. In fact she has very little area of weeping at this point there's just a small spot on the lateral portion of her right leg that still has me just a little bit more concerned as far as wanting to see this clear up before I discharge her to caring for this at home. Nonetheless overall she has made excellent progress. 12/09/17 on evaluation today patient appears to be doing rather well in regard to her lower extremity  edema. She does have some weeping still in the left lower extremity although the big area we were taking care of two weeks ago actually has closed and she has another area  of weeping on the left lower extremity immediately as well is the top of her foot. She does not currently have lymphedema pumps she has been wearing her compression daily on a regular basis as directed. With that being said I think she may benefit from lymphedema pumps. 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 We had a very difficult time getting and keeping this under control. 12/23/17 on evaluation today patient actually appears to be doing a little bit more poorly in regard to her bilateral lower extremities. She has been tolerating the Juxta-Lite compression wraps. Unfortunately she has two new ulcers on the right lower extremity and left lower Trinity ulceration seems to be larger. Obviously this is not good news. She has been tolerating the dressings without complication. 12/30/17 on evaluation today patient actually appears to be doing much better in regard to her bilateral lower extremity edema. She continues to have some issues with ulcerations and in fact there appears to be one spot on each leg where the wrap may have caused a little bit of a blister which is subsequently opened up at this point is given her pain. Fortunately it does not appear to be any evidence of infection which is good news. No fevers chills noted. 01/13/18 on evaluation today patient appears to be doing rather well in regard to her bilateral lower extremities. The dressings did get kind of stuck as far as the wound beds are concerned but again I think this is mainly due to the fact that she actually seems to be showing signs of healing which is good news. She's not having as much drainage  therefore she was having more of the dressing sticking. Nonetheless overall I feel like her swelling is dramatically down compared to previous. 01/20/18 on evaluation today patient unfortunately though she's doing better in most regards has a large blister on the left anterior lower extremity where she is draining quite significantly. Subsequently this is going to need debridement today in order to see what's underneath and ensure she does not continue to trapping fluid at this location. Nonetheless No fevers, chills, nausea, or vomiting noted at this time. 01/27/18 on evaluation today patient appears to be doing rather well at this point in regard to her right lower extremity there's just a very small area that she still has open at this point. With that being said I do believe that she is tolerating the compression wraps very well in making good progress. Home health is coming out at this point to see her. Her left lower extremity on the lateral portion is actually what still mainly open and causing her some discomfort for the most part 02/10/18 on evaluation today patient actually appears to be doing very well in regard to her right lower extremity were all the ulcers appear to be completely close. In regard to the left lower extremity she does have two areas still open and some leaking from the dorsal surface of her foot but this still seems to be doing much better to me in general. 02/24/18 on evaluation today patient actually appears to be doing much better in regard to her right lower extremity this is still completely healed. Her left lower extremity is also doing much better fortunately she has no evidence of infection. The one area that is gonna require some debridement is still on  the left anterior shin. Fortunately this is not hurting her as badly today. 03/10/18 on evaluation today patient appears to be doing better in some regards although she has a little bit more open area on the dorsal  foot and she also has some issues on the medial portion of the left lower extremity which is actually new and somewhat deep. With that being said there fortunately does not appear to be any significant signs of infection which is good news. No fevers, chills, nausea, or vomiting noted at this time. In general her swelling seems to be doing fairly well which is good news. 03/31/18 on evaluation today patient presents for follow-up concerning her left lower extremity lymphedema. Unfortunately she has been doing a little bit more poorly since I last saw her in regard to the amount of weeping that she is experiencing. She's also having some increased pain in the anterior shin location. Unfortunately I do not feel like the patient is making such good progress at this point a few weeks back she was definitely doing much better. 04/07/18 on evaluation today patient actually appears to be showing some signs of improvement as far as the left lower extremity is concerned. She has been tolerating the dressing changes and it does appear that the Drawtex did better for her. With that being said unfortunately home health is stating that they cannot obtain the Drawtex going forward. Nonetheless we're gonna have to check and see what they may be able to get the alginate they were using was getting stuck in causing new areas of skin being pulled all that with and subsequently weep and calls her to worsen overall this is the first time we've seen improvement at this time. 04/14/18 on evaluation today patient actually appears to be doing rather well at this point there does not appear to be any evidence of infection at this time and she is actually doing excellent in regard to the weeping in fact she almost has no openings remaining even compared to just last week this is a dramatic improvement. No fevers chills noted 04/21/18 evaluation today patient actually appears to be doing very well. She in fact is has a small  area on the posterior lower extremity location and she has a small area on the dorsal surface of her foot that are still open both of which are very close to closing. We're hoping this will be close shortly. She brought her Juxta-Lite wrap with her today hoping that would be able to put her in it unfortunately I don't think were quite at that point yet but we're getting closer. 04/28/18 upon evaluation today patient actually appears to be doing excellent in regard to her left lower extremity ulcer. In fact the region on the posterior lower extremity actually is much smaller than previously noted. Overall I'm very happy with the progress she has made. She again did bring her Juxta-Lite although we're not quite ready for that yet. 05/11/18 upon evaluation today patient actually appears to be doing in general fairly well in regard to her left lower Trinity. The swelling is very well controlled. With that being said she has a new area on the left anterior lower extremity as well as between the first and second toes of her left foot that was not present during the last evaluation. The region of her posterior left lower extremity actually appears to be almost completely healed. To be honest I'm very pleased with the way that stands. Nonetheless I do believe that  the lotion may be keeping the area to moist as far as her legs are concerned subsequently I'm gonna consider discontinuing that today. 05/26/18 on evaluation today patient appears to be doing rather well in regard to her left lower should be ulcers. In fact everything appears to be close except for a very small area on the left posterior lower extremity. Fortunately there does not appear to be any evidence of infection at this time. Overall very pleased with her progress. 06/02/18 and evaluation today patient actually appears to be doing very well in regard to her lower extremity ulcers. She has one small area that still continues to weep that I  think may benefit her being able to justify lotion and user Juxta-Lite wraps versus continued to wrap her. Nonetheless I think this is something we can definitely look into at this point. 06/23/18 on evaluation today patient unfortunately has openings of her bilateral lower extremities. In general she seems to be doing much worse than when I last saw her just as far as her overall health standpoint is concerned. She states that her discomfort is mainly due to neuropathy she's not having any other issues otherwise. No fevers, chills, nausea, or vomiting noted at this time. 06/30/18 on evaluation today patient actually appears to be doing a little worse in regard to her right lower extremity her left lower extremity of doing fairly well. Fortunately there is no sign of infection at this time. She has been tolerating the dressing changes without complication. Home health did not come out like they were supposed to for the appropriate wrap changes. They stated that they never received the orders from [place] which were fax. Nonetheless we will send a copy of the orders with the patient today as well. 07/07/18 on evaluation today patient appears to be doing much better in regard to lower extremities. She still has several openings bilaterally although since I last saw her her legs did show obvious signs of infection when she later saw her nurse. Subsequently a culture was obtained and she is been placed on Bactrim and Keflex. Fortunately things seem to be looking much better it does appear she likely had an infection. Again last week we'd even discussed it but again there really was not any obvious sign that she had infection therefore we held off on the antibiotics. Nonetheless I'm glad she's doing better today. 07/14/18 on evaluation today patient appears to be doing much better regarding her bilateral lower Trinity's. In fact on the right lower for me there's nothing open at this point there are some dry skin  areas at the sites where she had infection. Fortunately there is no evidence of systemic infection which is excellent news. No fevers chills noted 07/21/18 on evaluation today patient actually appears to be doing much better in regard to her left lower extremity ulcers. She is making good progress and overall I feel like she's improving each time I see her. She's having no pain I do feel like the infection is completely resolved which is excellent news. No fevers, chills, nausea, or vomiting noted at this time. 07/28/18 on evaluation today patient appears to be doing very well in regard to her left lower [employer]. Everything seems to be showing signs of improvement which is excellent news. Overall very pleased with the progress that has been made. Fortunately there's no evidence of active infection at this time also excellent news. 08/04/18 on evaluation today patient appears to be doing more poorly in regard to  her bilateral lower extremities. She has two new areas open up on the right and these were completely closed as of last week. She still has the two spots on the left which in my pinion seem to be doing better. Fortunately there's no evidence of infection again at this point. 08/11/18 on evaluation today patient actually appears to be doing very well in regard to her bilateral lower Trinity wounds that all seem to be doing better and are measures smaller today. Fortunately there's no signs of infection. No fevers, chills, nausea, or vomiting noted at this time. 08/18/18 on evaluation today patient actually appears to be doing about the same inverter bilateral lower extremities. She continues to have areas that blistering open as was drain that fortunately nothing too significant. Overall I feel like Drawtex may have done better for her however compared to the collagen. 08/25/18 on evaluation today patient appears to be doing a little bit more poorly today even compared to last time I saw  her. Again I'm not exactly sure why she's making worse progress over the past several weeks. I'm beginning to wonder if there is some kind of underlying low level infection causing this issue. I did actually take a culture from the left anterior lower extremity but it was a new wound draining quite a bit at this point. Unfortunately she also seems to be having more pain which is what also makes me worried about the possibility of infection. This is despite never erythema noted at this point. 09/01/18 on evaluation today patient actually appears to be doing a little worse even compared to last week in regard to bilateral lower extremities. She did go to the hospital on the 16th was given a dose of IV Zosyn and then discharged with a recommendation to continue with the Bactrim that I previously prescribed for her. Nonetheless she is still having a lot of discomfort she tells me as well at this time. This is definitely unfortunate. No fevers, chills, nausea, or vomiting noted at this time. 09/08/18 on evaluation today patient's bilateral lower extremities actually appear to be shown signs of improvement which is good news. Fortunately there does not appear to be any signs of active infection I think the anabiotic is helping in this regard. Overall I'm very pleased with how she is progressing. 09/15/18 patient was actually seen in ER yesterday due to her legs as well unfortunately. She states she's been having a lot of pain and discomfort as well as a lot of drainage. Upon inspection today the patient does have a lot of swelling and drainage I feel like this is more related to lymphedema and poor fluid control than it is to infection based on what I'm seeing. The physician in the emergency department also doubted that the patient was having a significant infection nonetheless I see no evidence of infection obvious at this point although I do see evidence of poor fluid control. She still not using a  compression pumps, she is not elevating due to her lift chair as well as her hospital bed being broken, and she really is not keeping her legs up as much as they should be and also has been taking off her wraps. All this combined I think has led to poor fluid control and to be honest she may be somewhat volume overloaded in general as well. I recommend that she may need to contact your physician to see if a prescription for a diuretic would be beneficial in their opinion. As  long as this is safe I think it would likely help her. 09/29/18 on evaluation today patient's left lower extremity actually appears to be doing quite a bit better. At least compared to last time that I saw her. She still has a large area where she is draining from but there's a lot of new skin speckled trout and in fact there's more new skin that there are open areas of weeping and drainage at this point. This is good news. With regard to the right lower extremity this is doing much better with the only open area that I really see being a dry spot on the right lateral ankle currently. Fortunately there's no signs of active infection at this time which is good news. No fevers, chills, nausea, or vomiting noted at this time. The patient seems somewhat stressed and overwhelmed during the visit today she was very lethargic as such. She does and she is not taking any pain medications at this point. Apparently according to her husband are also in the process of moving which is probably taking its toll on her as well. 10/06/18 on evaluation today patient appears to be doing rather well in regard to her lower extremities compared to last evaluation. Fortunately there's no signs of active infection. She tells me she did have an appointment with her primary. Nonetheless he was concerned that the wounds were somewhat deep based on pictures but we never actually saw her legs. She states that he had her somewhat worried due to the fact that  she was fearing now that she was [place] have to have an amputation. With that being said based on what I'm seeing check she looks better this week that she has the last two times I've seen her with much less drainage I'm actually pleased in this regard. That doesn't mean that she's out of the water but again I do not think what the point of talking about education at all in regard to her leg. She is very happy to hear this. She is also not having as much pain as she was having last week. 10/13/18 unfortunately on evaluation today patient still continues to have a significant amount of drainage she's not letting home health actually apply the compression dressings at this point. She's trying to use of Juxta-Lite of the top of Kerlex and the second layer of the three layer compression wrap. With that being said she just does not seem to be making as good a progress as I would expect if she was having the compression applied and in place on a regular basis. No fevers, chills, nausea, or vomiting noted at this time. 10/20/18 on evaluation today patient appears to be doing a little better in regard to her bilateral lower extremity ulcers. In fact the right lower extremity seems to be healed she doesn't even have any openings at this point left lower extremity though still somewhat macerated seems to be showing signs of new skin growth at multiple locations throughout. Fortunately there's no evidence of active infection at this time. No fevers, chills, nausea, or vomiting noted at this time. 10/27/18 on evaluation today patient appears to be doing much better in regard to her left lower Trinity ulcer. She's been tolerating the laptop complication and has minimal drainage noted at this point. Fortunately there's no signs of active infection at this time. No fevers, chills, nausea, or vomiting noted at this time. 11/03/18 on evaluation today patient actually appears to be doing excellent in regard to her  left  lower extremity. She is having very little drainage at this point there does not appear to be any significant signs of infection overall very pleased with how things have gone. She is likewise extremely pleased still and seems to be making wonderful progress week to week. I do believe antibiotics were helpful for her. Her primary care provider did place on amateur clean since I last saw her. 11/17/18 on evaluation today patient appears to be doing worse in regard to her bilateral lower extremities at this point. She is been tolerating the dressing changes without complication. With that being said she typically takes the Coban off fairly quickly upon arriving home even after being seen here in the clinic and does not allow home health reapply command as part of the dressing at home. Therefore she said no compression essentially since I last saw her as best I can tell. With that being said I think it shows and how much swelling she has in the open wounds that are noted at this point. Fortunately there's no signs of infection but unfortunately if she doesn't get this under control I think she will end up with infection and more significant issues. 11/24/18 on evaluation today patient actually appears to be doing somewhat better in regard to her bilateral lower extremities. 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 Nonetheless that is doing better currently so she should be able to attempt reinitiating the lymphedema pumps at this time. No fevers, chills, nausea, or vomiting noted at this time. 12/01/18 upon evaluation today patient's left lower extremity appears to be doing quite well unfortunately her right lower extremity is not doing nearly as well. She has been tolerating the dressing changes without complication unfortunately she did not keep a wrap on the  right at this time. Nonetheless I believe this has led to increased swelling and weeping in the world is actually much larger than during the last evaluation with her. 12/08/18 on evaluation today patient appears to be doing about the same at this point in regard to her right lower extremity. There is some more palatable to touch I'm concerned about the possibility of there being some infection although I think the main issue is she's not keeping her compression wrap on which in turn is not allowing this area to heal appropriately. 12/22/18 on evaluation today patient appears to be doing better in regard to left lower extremity unfortunately significantly worse in regard to the right lower extremity. The areas of blistering and necrotic superficial tissue have spread and again this does not really appear to be signs of infection and all she just doesn't seem to be doing nearly as well is what she has been in the past. Overall I feel like the Augmentin did absolutely nothing for her she doesn't seem to have any infection again I really didn't think so last time either is more of a potential preventative measure and hoping that this would make some difference but I think the main issue is she's not wearing her compression. She tells me she cannot wear the [place] been we put on she takes it off pretty much upon getting home. Subsequently she worshiped Juxta-Lite when I questioned her about how often she wears it this is no more than three hours a day obviously that leaves 21 hours  that she has no compression and this is obviously not doing well for her. Overall I'm concerned that if things continue to worsen she is at great risk of both infection as well as losing her leg. 01/05/19 on evaluation today patient appears to be doing well in regard to her left lower extremity which he is allowing [place] to wrap and not so well with regard to her right lower extremity which she is not allowing [place] to really wrap  and keep the wrap on. She states that it hurts too badly whenever it's wrapped and she ends up having to take it off. She's been using the Juxta-Lite she tells me up to six hours a day although I question whether or not that's really been the case to be honest. Previously she told me three hours today nonetheless obviously the legs as long as the wrap is doing great when she is not is doing much more poorly. 01/12/2019 on evaluation today patient actually appears to be doing a little better in my opinion with regard to her right lower extremity ulcer. She has a small open area on the left lower extremity unfortunately but again this I think is part of the normal fluctuation of what she is going to have to expect with regard to her legs especially when she is not using her lymphedema pumps on a regular basis. Subsequently based on what I am seeing today I think that she does seem to be doing slightly better with regard to her right lower extremity she did see her primary care provider on Monday they felt she had an infection and placed her on 2 antibiotics. Both Cipro and clindamycin. Subsequently again she seems possibly to be doing a little bit better in regards to the right lower extremity she also tells me however she has been wearing the compression wrap over the past week since I spoke with her as well that is a Kerlix and Coban wrap on the right. No fevers, chills, nausea, vomiting, or diarrhea. 01/19/2019 on evaluation today patient appears to be doing better with regard to her bilateral lower extremities especially the right. I feel like the compression has been beneficial for her which is great news. She did get a call from her primary care provider on her way here today telling her that she did have methicillin-resistant Staphylococcus aureus and he was calling in a couple new antibiotics for her including a ointment to be applied she tells me 3 times a day. With that being said this sounds  like likely to be Bactroban which I think could be applied with each dressing/wrap change but I would not be able to accommodate her applying this 3 times a day. She is in agreement with the least doing this we will add that to her orders today. 01/26/2019 on evaluation today patient actually appears to be doing much better with regard to her right lower extremity. Her left lower extremity is also doing quite well all things considering. Fortunately there is no evidence of active infection at this time. No fevers, chills, nausea, vomiting, or diarrhea. 02/02/2019 on evaluation today patient appears to be doing much better compared to her last evaluation. Little by little off like her right leg is returning more towards normal. There does not appear to be any signs of active infection and overall she seems to be doing quite well which is great news. I am very pleased in this regard. No fevers, chills, nausea, vomiting, or diarrhea. 02/09/2019 upon  evaluation today patient appears to be doing better with regard to her bilateral lower extremities. She has been tolerating the dressing changes without complication. Fortunately there is no signs of active infection at this time. No fevers, chills, nausea, vomiting, or diarrhea. 02/23/2019 on evaluation today patient actually appears to be doing quite well with regard to her bilateral lower extremities. She has been tolerating the dressing changes without complication. She is even used her pumps one time and states that she really felt like it felt good. With that being said she seems to be in good spirits and her legs appear to be doing excellent. 03/09/2019 on evaluation today patient appears to be doing well with regard to her right lower extremity there are no open wounds at this time she is having some discomfort but I feel like this is more neuropathy than anything. With regard to her left lower extremity she had several areas scattered around that she  does have some weeping and drainage from but again overall she does not appear to be having any significant issues and no evidence of infection at this time which is good news. 03/23/2019 on evaluation today patient appears to be doing well with regard to her right lower extremity which she tells me is still close she is using her juxta light here. Her left lower extremity she mainly just has an area on the foot which is still slightly draining although this also is doing great. Overall very pleased at this time. Electronic Signature(s) Signed: 03/23/2019 11:37:08 AM By: Lenda Kelp PA-C Entered By: Lenda Kelp on 03/23/2019 11:37:08 -------------------------------------------------------------------------------- Physical Exam Details Patient Name: Date of Service: OLENA, WILLY 03/23/2019 10:00 AM Medical Record [id number] Patient Account Number: [id number] Date of Birth/Sex: Treating RN: 06-15-1951 (67 y.o. [name] Primary Care Provider: Fatima Sanger Other Clinician: Referring Provider: Treating Provider/Extender:Stone III, Flo Shanks, FRED Weeks in Treatment: 80 Constitutional Obese and well-hydrated in no acute distress. Respiratory normal breathing without difficulty. clear to auscultation bilaterally. Cardiovascular regular rate and rhythm with normal S1, S2. Psychiatric this patient is able to make decisions and demonstrates good insight into disease process. Alert and Oriented x 3. pleasant and cooperative. Notes Patient's wound bed currently showed signs of really just being very superficial as far as what was draining on the top of the foot and right at the base of her fourth and third toes. Really when I got to look into this further she did have some thick skin that was removed from the surface just mechanically and underneath it appears to be healed for the most part there was just a small area of leaking which I think may have a better chance  of closing now that we got a lot of this off. Fortunately there is no signs of active infection at this time which is excellent news. Electronic Signature(s) Signed: 03/23/2019 11:37:55 AM By: Lenda Kelp PA-C Entered By: Lenda Kelp on 03/23/2019 11:37:55 -------------------------------------------------------------------------------- Physician Orders Details Patient Name: Date of Service: [name]. 03/23/2019 10:00 AM Medical Record [id number] Patient Account Number: [id number] Date of Birth/Sex: Treating RN: [birth year] (67 y.o. [name] Primary Care Provider: Fatima Sanger Other Clinician: Referring Provider: Treating Provider/Extender:Stone III, Flo Shanks, FRED Weeks in Treatment: 22 Verbal / Phone Orders: No Diagnosis Coding ICD-10 Coding Code Description E11.622 Type 2 diabetes mellitus with other skin ulcer I89.0 Lymphedema, not elsewhere classified I87.331 Chronic venous hypertension (idiopathic) with ulcer and inflammation of right lower extremity I87.332 Chronic  venous hypertension (idiopathic) with ulcer and inflammation of left lower extremity L97.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 leg with fat layer exposed L97.822 Non-pressure chronic ulcer of other part of left lower leg with fat layer exposed I10 Essential (primary) hypertension E66.01 Morbid (severe) obesity due to excess calories F41.8 Other specified anxiety disorders R53.1 Weakness Follow-up Appointments Return Appointment in 2 weeks. Dressing Change Frequency Wound #57 Left,Dorsal Foot Change dressing three times week. Skin Barriers/Peri-Wound Care Moisturizing lotion - to right leg at nightly after removing juxtalite Wound Cleansing Wound #57 Left,Dorsal Foot May shower with protection. Primary Wound Dressing Wound #57 Left,Dorsal Foot Calcium Alginate with Silver Other: - mupirocin to wound bed under alginate Secondary Dressing Wound #57  Left,Dorsal Foot Dry Gauze Edema Control 3 Layer Compression System - Left Lower Extremity Avoid standing for long periods of time - walking is encouraged Elevate legs to the level of the heart or above for 30 minutes daily and/or when sitting, a frequency of: - do not sleep in chair with feet dangling Exercise regularly Support Garment 20-30 mm/Hg pressure to: - Juxtalite to right lower leg daily Segmental Compressive Device. - lymphedema pumps 60 minutes 1- 2 times per day Additional Orders / Instructions Follow Nutritious Diet - To include vitamin A, vitamin C, and Zinc along with increased protein intake. Home Health Continue Home Health skilled nursing for wound care. - [facility] Electronic Signature(s) Signed: 03/24/2019 12:09:12 PM By: Zenaida Deed RN, BSN Signed: 03/27/2019 10:37:03 PM By: Lenda Kelp PA-C Entered By: Zenaida Deed on 03/23/2019 11:35:50 -------------------------------------------------------------------------------- Problem List Details Patient Name: Date of Service: Marlana, Mckowen 03/23/2019 10:00 AM Medical Record [id number] Patient Account Number: [id number] Date of Birth/Sex: Treating RN: [birth year] (67 y.o. [place], Linda Primary Care Provider: Fatima Sanger Other Clinician: Referring Provider: Treating Provider/Extender:Stone III, Flo Shanks, FRED Weeks in Treatment: 15 Active Problems ICD-10 Evaluated Encounter Code Description Active Date Today Diagnosis E11.622 Type 2 diabetes mellitus with other skin ulcer 08/05/2017 No Yes I89.0 Lymphedema, not elsewhere classified 08/05/2017 No Yes I87.331 Chronic venous hypertension (idiopathic) with ulcer 08/05/2017 No Yes and inflammation of right lower extremity I87.332 Chronic venous hypertension (idiopathic) with ulcer 08/05/2017 No Yes and inflammation of left lower extremity L97.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 08/05/2017 No Yes leg with fat layer  exposed L97.822 Non-pressure chronic ulcer of other part of left lower 08/05/2017 No Yes leg with fat layer exposed I10 Essential (primary) hypertension 08/05/2017 No Yes E66.01 Morbid (severe) obesity due to excess calories 08/05/2017 No Yes F41.8 Other specified anxiety disorders 08/05/2017 No Yes R53.1 Weakness 08/05/2017 No Yes Inactive Problems Resolved Problems Electronic Signature(s) Signed: 03/23/2019 11:08:36 AM By: Lenda Kelp PA-C Entered By: Lenda Kelp on 03/23/2019 11:08:36 -------------------------------------------------------------------------------- Progress Note Details Patient Name: Date of Service: [name]. 03/23/2019 10:00 AM Medical Record [id number] Patient Account Number: [id number] Date of Birth/Sex: Treating RN: [birth year] (67 y.o. [name] Primary Care Provider: Fatima Sanger Other Clinician: Referring Provider: Treating Provider/Extender:Stone III, Flo Shanks, FRED Weeks in Treatment: 60 Subjective Chief Complaint Information obtained from Patient Bilateral reoccurring LE ulcers History of Present Illness (HPI) this patient has been seen a couple of times before and returns with recurrent problems to her right and left lower extremity with swelling and weeping ulcerations due to not wearing her compression stockings which she had been advised to do during her last discharge, at the end of June 2018. During her last visit the patient had  had normal arterial blood flow and her venous reflux study did not necessitate any surgical intervention. She was recommended compression and elevation and wound care. After prolonged treatment the patient was completely healed but she has been noncompliant with wearing or compressions.. She was here last week with an outpatient return visit planned but the patient came in a very poor general condition with altered mental status and was rushed to the ER on my request. With a history  of hypertension, diabetes, TIA and right-sided weakness she was set up for an MRI on her brain and cervical spine and was sent to [facility]. Getting an MRI done was very difficult but once the workup was done she was found not to have any spinal stenosis, epidural abscess or hematoma or discitis. This was radiculopathy to be treated as an outpatient and she was given a follow-up appointment. Today she is feeling much better alert and oriented and has come to reevaluate her bilateral lower extremity lymphedema and ulceration 03/25/2017 -- she was admitted to the hospital on 03/16/2017 and discharged on 03/18/2017 with left leg cellulitis and ulceration. She was started on vancomycin and Zosyn and x-ray showed no bony involvement. She was treated for a cellulitis with IV antibiotics changed to Rocephin and Flagyl and was discharged on oral Keflex and doxycycline to complete a 7 day course. Last hemoglobin A1c was 7.1 and her other ailments including hypertension got asthma were appropriately treated. 05/06/2017 -- she is awaiting the right size of compression stockings from [place] but other than that has been doing well. ====== Old notes 68 year old patient was seen one time last October and was lost to follow-up. She has recurrent problems with weeping and ulceration of her left lower extremity and has swelling of this for several years. It has been worse for the last 2 months. Past medical history is significant for diabetes mellitus type 2, hypertension, gout, morbid obesity, depressive disorders, hiatal hernia, migraines, status post knee surgery, risk of a cholecystectomy, vaginal hysterectomy and breast biopsy. She is not a smoker. As noted before she has never had a venous duplex study and an arterial ABI study was attempted but the left lower extremity was noncompressible 10/01/2016 -- had a lower extremity venous duplex reflux evaluation which showed no evidence of deep vein reflux  in the right or left lower extremity, and no evidence of great saphenous vein reflux more than 500 ms in the right or left lower extremity, and the left small saphenous vein is incompetent but no vascular consult was recommended. review of her electronic medical records noted that the ABI was checked in July 2017 where the right ABI was normal limits and the left ABI could not be ascertained due to pain with cuff pressure but the waveforms are within normal limits. her arterial duplex study scheduled for April 27. 10/08/2016 -- the patient has various reasons for not having a compression on and for the last 3 days she has had no compression on her left lower extremity either due to pain or the lack of nursing help. She does not use her juxta lites either. 10/15/2016 -- the patient did not keep her appointment for arterial duplex study on April 27 and I have asked her to reschedule this. Her pain is out of proportion with the physical findings and she continuously fails to wear a compression wraps and cuts them off because she says she cannot tolerate the pain. She does not use her juxta lites either. 10/22/2016 -- he  has rescheduled her arterial duplex study to May 21 and her pain today is a bit better. She has not been wearing her juxta lites on her right lower extremity but now understands that she needs to do this. She did tolerate the to press compression wrap on her left lower extremity 10/29/2016 --arterial duplex study is scheduled for next week and overall she has been tolerating her compression wraps and also using her juxta lites on her right lower extremity 11/05/2016 -- the right ABI was 0.95 the left was 1.03. The digit TBI is on the right was 0.83 on the left was 0.92 and she had biphasic flow through these vessels. The impression was that of normal lower extremity arterial study. 11/12/2016 -- her pain is minimal and she is doing very well overall. 11/26/2016 -- she has got juxta  lites and her insurance will not pay for additional dual layer compression stockings. She is going to order some from [facility]. 05/12/2017 -- her juxta lites are very old and too big for her and these have not been helping with compression. She did get 20-30 mm compression stockings from [facility] but she and her husband are unable to put these on. I believe she will benefit from bilateral Extremit-ease, compression stockings and we will measure her for these today. 05/20/2017 -- lymphedema on the left lower extremity has increased a lot and she has a open ulceration as a result of this. The right lower extremity is looking pretty good. She has decided to by the compression stockings herself and will get reimbursed by the home health, at a later date. 05/27/2017 -- her sciatica is bothering her a lot and she thought her left leg pain was caused due to the compression wrap and hence removed it and has significant lymphedema. There is no inflammation on this left lower extremity. 06/17/17 on evaluation today patient appears to be doing very well and in fact is completely healed in regard to her ulcerations. Unfortunately however she does have continued issues with lymphedema nonetheless. We did order compression garments for her unfortunately she states that the size that she received were large although we ordered medium. Obviously this means she is not getting the optimal compression. She does not have those with her today and therefore we could not confirm and contact the company on her behalf. Nonetheless she does state that she is going to have her husband bring them by tomorrow so that we can verify and then get in touch with the company. No fevers, chills, nausea, or vomiting noted at this time. Overall patient is doing better otherwise and I'm pleased with the progress she has made. 07/01/17 on evaluation today patient appears to be doing very well in regard to her bilateral lower extremity she  does not have any openings at this point which is excellent news. Overall I'm pleased with how things have progressed up to this time. Since she is doing so well we did order her compression which we are seeing her today to ensure that it fits her properly and everything is doing well in that regard and then subsequently she will be discharged. ============ Old Notes: 03/31/16 patient presents today for evaluation concerning open wounds that she has over the left medial ankle region as well as the left dorsal foot. She has previously had this occur although it has been healed for a number of months after having this for about a year prior until her hospitalization on 01/05/16. At that point in time  it appears that she was admitted to the hospital for left lower extremity cellulitis and was placed on vancomycin and Zosyn at that point. Eventually upon discharge on January 15, 2016 she was placed on doxycycline at that point in time. Later on 03/27/16 positive wound culture growing Escherichia coli this was switched to amoxicillin. Currently she tells me that she is having pain radiated to be a 7 out of 10 which can be as high as 10 out of 10 with palpation and manipulation of the wound. This wound appears to be mainly venous in nature due to the bilateral lower extremity venous stasis/lymphedema. This is definitely much worse on her left than the right side. She does have type 1 diabetes mellitus, hypertension, morbid obesity, and is wheelchair dependent.during the course of the hospital stay a blood culture was also obtained and fortunately appeared negative. She also had an x-ray of the tibia/fibula on the left which showed no acute bone abnormality. Her white blood cell count which was performed last on 03/25/16 was 7.3, hemoglobin 12.8, protein 7.1, albumin 3.0. Her urine culture appeared to be negative for any specific organisms. Patient did have a left lower extremity venous duplex evaluation  for DVT. This did not include venous reflux studies but fortunately was negative for DVT. Patient also had arterial studies performed which revealed that she had a normal ABI on the right though this was unable to be performed on the left secondary to pain that she was having around the ankle region due to the wound. However it was stated on report that she had biphasic pulses and apparently good blood flow. ========== 06/03/17 she is here in follow-up evaluation for right lower extremity ulcer. The right lower sure he has healed but she has reopened to the left medial malleolus and dorsal foot with weeping. She is waiting for new compression garments to arrive from home health, the previous compression garments were ill fitting. We will continue with compression bilaterally and follow-up in 2 weeks Readmission: 08/05/17 on evaluation today patient appears to be doing somewhat poorly in regard to her left lower extremity especially although the right lower extremity has a small area which may no longer be open. She has been having a lot of drainage from the left lower extremity however he tells me that she has not been able to use the EXTREMIT- EASE Compression at this point. She states that she did better and was able to actually apply the Juxta-Lite compression although the wound that she has is too large and therefore really does not compress which is why she cannot wear it at this point. She has no one who can help her put it on regular basis her son can sometimes but he's not able to do it most of the time. I do believe that's why she has begun to weave and have issues as she is currently yet again. No fevers, chills, nausea, or vomiting noted at this time. Patient is no evidence of dementia. 08/12/17 on evaluation today patient appears to still be doing fairly well in regard to the draining areas/weeping areas at this point. With that being said she unfortunately did go to the ER yesterday  due to what was felt to be possibly a cellulitis. They place her on doxycycline by mouth and discharge her home. She definitely was not admitted. With that being said she states she has had more discomfort which has been unusual for her even compared to prior times and she's had infections.08/12/17  on evaluation today patient appears to still be doing fairly well in regard to the draining areas/weeping areas at this point. With that being said she unfortunately did go to the ER yesterday due to what was felt to be possibly a cellulitis. They place her on doxycycline by mouth and discharge her home. She definitely was not admitted. With that being said she states she has had more discomfort which has been unusual for her even compared to prior times and she's had infections. 08/19/17 put evaluation today patient tells me that she's been having a lot of what sounds to be neuropathic type pain in regard to her left lower extremity. She has been using over-the-counter topical bins again which some believe. That in order to apply the she actually remove the wrap we put on her last Wednesday on Thursday. Subsequently she has not had anything on compression wise since that time. The good news is a lot of the weeping areas appear to have closed at this point again I believe she would do better with compression but we are struggling to get her to actually use what she needs to at this point. No fevers, chills, nausea, or vomiting noted at this time. 09/03/17 on evaluation today patient appears to be doing okay in regard to her lower extremities in regard to the lymphedema and weeping. Fortunately she does not seem to show any signs of infection at this point she does have a little bit of weeping occurring in the right medial malleolus area. With that being said this does not appear to be too significant which is good news. 09/10/17; this is a patient with severe bilateral secondary lymphedema secondary to  chronic venous insufficiency. She has severe skin damage secondary to both of these features involving the dorsal left foot and medial left ankle and lower leg. Still has open areas in the left anterior foot. The area on the right closed over. She uses her own juxta light stockings. She does not have an arterial issue 09/16/17 on evaluation today patient actually appears to be doing excellent in regard to her bilateral lower extremity swelling. The Juxta-Lite compression wrap seem to be doing very well for her. She has not however been using the portion that goes over her foot. Her left foot still is draining a little bit not nearly as significant as it has been in the past but still I do believe that she likely needs to utilize the full wrap including the foot portion of this will improve as well. She also has been apparently putting on a significant amount of Vaseline which also think is not helpful for her. I recommended that if she feels she needs something for moisturizer Eucerin will probably be better. 09/30/17 on evaluation today patient presents with several new open areas in regard to her left lower extremity although these appear to be minimal and mainly seem to be more moisture breakdown than anything. Fortunately she does not seem to have any evidence of infection which is great news. She has been tolerating the dressing changes without complication we are using silver alginate on the foot she has been using AB pads to have the legs and using her Juxta-Lite compression which seems to be controlling her swelling very well. Overall I'm pleased with the poor way she has progressed. 10/14/17 on evaluation today patient appears to be doing better in regard to her left lower extremity areas of weeping. She does still have some discomfort although in general this does  not appear to be as macerated and I think it is progressing nicely. I do think she still needs to wear the foot portion of her  Juxta-Lite in order to get the most benefit from the wrap obviously. She states she understands. Fortunately there does not appear to be evidence of infection at this time which is great news. 10/28/17 on evaluation today patient appears to be doing excellent in regard to her left lower extremity. She has just a couple areas that are still open and seem to be causing any trouble whatsoever. For that reason I think that she is definitely headed in the right direction the spots are very tiny compared to what we have been dealing with in the past. 11/11/17 on evaluation today patient appears to have a right lateral lower extremity ulcer that has opened since I last saw her. She states this is where the home health nurse that was coming out remove the dressing without wetting the alginate first. Nonetheless I do not know if this is indeed the case or not but more importantly we have not ordered home help to be coming out for her wounds at all. I'm unsure as to why they are coming out and we're gonna have to check on this and get things situated in that regard. With that being said we currently really do not need them to be coming out as the patient has been taking care of her leg herself without complication and no issues. In fact she was doing much better prior to nursing coming out. 11/25/17 on evaluation today patient actually appears to be doing fairly well in regard to her left lower extremity swelling. In fact she has very little area of weeping at this point there's just a small spot on the lateral portion of her right leg that still has me just a little bit more concerned as far as wanting to see this clear up before I discharge her to caring for this at home. Nonetheless overall she has made excellent progress. 12/09/17 on evaluation today patient appears to be doing rather well in regard to her lower extremity edema. She does have some weeping still in the left lower extremity although the big  area we were taking care of two weeks ago actually has closed and she has another area of weeping on the left lower extremity immediately as well is the top of her foot. She does not currently have lymphedema pumps she has been wearing her compression daily on a regular basis as directed. With that being said I think she may benefit from lymphedema pumps. 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 We had a very difficult time getting and keeping this under control. 12/23/17 on evaluation today patient actually appears to be doing a little bit more poorly in regard to her bilateral lower extremities. She has been tolerating the Juxta-Lite compression wraps. Unfortunately she has two new ulcers on the right lower extremity and left lower Trinity ulceration seems to be larger. Obviously this is not good news. She has been tolerating the dressings without complication. 12/30/17 on evaluation today patient actually appears to be doing much better in regard to her bilateral lower extremity edema. She continues to have some issues with ulcerations and in fact there appears to be one spot on each leg where the wrap may have  caused a little bit of a blister which is subsequently opened up at this point is given her pain. Fortunately it does not appear to be any evidence of infection which is good news. No fevers chills noted. 01/13/18 on evaluation today patient appears to be doing rather well in regard to her bilateral lower extremities. The dressings did get kind of stuck as far as the wound beds are concerned but again I think this is mainly due to the fact that she actually seems to be showing signs of healing which is good news. She's not having as much drainage therefore she was having more of the dressing sticking. Nonetheless overall I feel like her  swelling is dramatically down compared to previous. 01/20/18 on evaluation today patient unfortunately though she's doing better in most regards has a large blister on the left anterior lower extremity where she is draining quite significantly. Subsequently this is going to need debridement today in order to see what's underneath and ensure she does not continue to trapping fluid at this location. Nonetheless No fevers, chills, nausea, or vomiting noted at this time. 01/27/18 on evaluation today patient appears to be doing rather well at this point in regard to her right lower extremity there's just a very small area that she still has open at this point. With that being said I do believe that she is tolerating the compression wraps very well in making good progress. Home health is coming out at this point to see her. Her left lower extremity on the lateral portion is actually what still mainly open and causing her some discomfort for the most part 02/10/18 on evaluation today patient actually appears to be doing very well in regard to her right lower extremity were all the ulcers appear to be completely close. In regard to the left lower extremity she does have two areas still open and some leaking from the dorsal surface of her foot but this still seems to be doing much better to me in general. 02/24/18 on evaluation today patient actually appears to be doing much better in regard to her right lower extremity this is still completely healed. Her left lower extremity is also doing much better fortunately she has no evidence of infection. The one area that is gonna require some debridement is still on the left anterior shin. Fortunately this is not hurting her as badly today. 03/10/18 on evaluation today patient appears to be doing better in some regards although she has a little bit more open area on the dorsal foot and she also has some issues on the medial portion of the left lower extremity which  is actually new and somewhat deep. With that being said there fortunately does not appear to be any significant signs of infection which is good news. No fevers, chills, nausea, or vomiting noted at this time. In general her swelling seems to be doing fairly well which is good news. 03/31/18 on evaluation today patient presents for follow-up concerning her left lower extremity lymphedema. Unfortunately she has been doing a little bit more poorly since I last saw her in regard to the amount of weeping that she is experiencing. She's also having some increased pain in the anterior shin location. Unfortunately I do not feel like the patient is making such good progress at this point a few weeks back she was definitely doing much better. 04/07/18 on evaluation today patient actually appears to be showing some signs of improvement as far as the left lower  extremity is concerned. She has been tolerating the dressing changes and it does appear that the Drawtex did better for her. With that being said unfortunately home health is stating that they cannot obtain the Drawtex going forward. Nonetheless we're gonna have to check and see what they may be able to get the alginate they were using was getting stuck in causing new areas of skin being pulled all that with and subsequently weep and calls her to worsen overall this is the first time we've seen improvement at this time. 04/14/18 on evaluation today patient actually appears to be doing rather well at this point there does not appear to be any evidence of infection at this time and she is actually doing excellent in regard to the weeping in fact she almost has no openings remaining even compared to just last week this is a dramatic improvement. No fevers chills noted 04/21/18 evaluation today patient actually appears to be doing very well. She in fact is has a small area on the posterior lower extremity location and she has a small area on the dorsal  surface of her foot that are still open both of which are very close to closing. We're hoping this will be close shortly. She brought her Juxta-Lite wrap with her today hoping that would be able to put her in it unfortunately I don't think were quite at that point yet but we're getting closer. 04/28/18 upon evaluation today patient actually appears to be doing excellent in regard to her left lower extremity ulcer. In fact the region on the posterior lower extremity actually is much smaller than previously noted. Overall I'm very happy with the progress she has made. She again did bring her Juxta-Lite although we're not quite ready for that yet. 05/11/18 upon evaluation today patient actually appears to be doing in general fairly well in regard to her left lower Trinity. The swelling is very well controlled. With that being said she has a new area on the left anterior lower extremity as well as between the first and second toes of her left foot that was not present during the last evaluation. The region of her posterior left lower extremity actually appears to be almost completely healed. To be honest I'm very pleased with the way that stands. Nonetheless I do believe that the lotion may be keeping the area to moist as far as her legs are concerned subsequently I'm gonna consider discontinuing that today. 05/26/18 on evaluation today patient appears to be doing rather well in regard to her left lower should be ulcers. In fact everything appears to be close except for a very small area on the left posterior lower extremity. Fortunately there does not appear to be any evidence of infection at this time. Overall very pleased with her progress. 06/02/18 and evaluation today patient actually appears to be doing very well in regard to her lower extremity ulcers. She has one small area that still continues to weep that I think may benefit her being able to justify lotion and user Juxta-Lite wraps versus  continued to wrap her. Nonetheless I think this is something we can definitely look into at this point. 06/23/18 on evaluation today patient unfortunately has openings of her bilateral lower extremities. In general she seems to be doing much worse than when I last saw her just as far as her overall health standpoint is concerned. She states that her discomfort is mainly due to neuropathy she's not having any other issues otherwise. No fevers,  chills, nausea, or vomiting noted at this time. 06/30/18 on evaluation today patient actually appears to be doing a little worse in regard to her right lower extremity her left lower extremity of doing fairly well. Fortunately there is no sign of infection at this time. She has been tolerating the dressing changes without complication. Home health did not come out like they were supposed to for the appropriate wrap changes. They stated that they never received the orders from [place] which were fax. Nonetheless we will send a copy of the orders with the patient today as well. 07/07/18 on evaluation today patient appears to be doing much better in regard to lower extremities. She still has several openings bilaterally although since I last saw her her legs did show obvious signs of infection when she later saw her nurse. Subsequently a culture was obtained and she is been placed on Bactrim and Keflex. Fortunately things seem to be looking much better it does appear she likely had an infection. Again last week we'd even discussed it but again there really was not any obvious sign that she had infection therefore we held off on the antibiotics. Nonetheless I'm glad she's doing better today. 07/14/18 on evaluation today patient appears to be doing much better regarding her bilateral lower Trinity's. In fact on the right lower for me there's nothing open at this point there are some dry skin areas at the sites where she had infection. Fortunately there is no evidence of  systemic infection which is excellent news. No fevers chills noted 07/21/18 on evaluation today patient actually appears to be doing much better in regard to her left lower extremity ulcers. She is making good progress and overall I feel like she's improving each time I see her. She's having no pain I do feel like the infection is completely resolved which is excellent news. No fevers, chills, nausea, or vomiting noted at this time. 07/28/18 on evaluation today patient appears to be doing very well in regard to her left lower [employer]. Everything seems to be showing signs of improvement which is excellent news. Overall very pleased with the progress that has been made. Fortunately there's no evidence of active infection at this time also excellent news. 08/04/18 on evaluation today patient appears to be doing more poorly in regard to her bilateral lower extremities. She has two new areas open up on the right and these were completely closed as of last week. She still has the two spots on the left which in my pinion seem to be doing better. Fortunately there's no evidence of infection again at this point. 08/11/18 on evaluation today patient actually appears to be doing very well in regard to her bilateral lower Trinity wounds that all seem to be doing better and are measures smaller today. Fortunately there's no signs of infection. No fevers, chills, nausea, or vomiting noted at this time. 08/18/18 on evaluation today patient actually appears to be doing about the same inverter bilateral lower extremities. She continues to have areas that blistering open as was drain that fortunately nothing too significant. Overall I feel like Drawtex may have done better for her however compared to the collagen. 08/25/18 on evaluation today patient appears to be doing a little bit more poorly today even compared to last time I saw her. Again I'm not exactly sure why she's making worse progress over the past  several weeks. I'm beginning to wonder if there is some kind of underlying low level infection causing this  issue. I did actually take a culture from the left anterior lower extremity but it was a new wound draining quite a bit at this point. Unfortunately she also seems to be having more pain which is what also makes me worried about the possibility of infection. This is despite never erythema noted at this point. 09/01/18 on evaluation today patient actually appears to be doing a little worse even compared to last week in regard to bilateral lower extremities. She did go to the hospital on the 16th was given a dose of IV Zosyn and then discharged with a recommendation to continue with the Bactrim that I previously prescribed for her. Nonetheless she is still having a lot of discomfort she tells me as well at this time. This is definitely unfortunate. No fevers, chills, nausea, or vomiting noted at this time. 09/08/18 on evaluation today patient's bilateral lower extremities actually appear to be shown signs of improvement which is good news. Fortunately there does not appear to be any signs of active infection I think the anabiotic is helping in this regard. Overall I'm very pleased with how she is progressing. 09/15/18 patient was actually seen in ER yesterday due to her legs as well unfortunately. She states she's been having a lot of pain and discomfort as well as a lot of drainage. Upon inspection today the patient does have a lot of swelling and drainage I feel like this is more related to lymphedema and poor fluid control than it is to infection based on what I'm seeing. The physician in the emergency department also doubted that the patient was having a significant infection nonetheless I see no evidence of infection obvious at this point although I do see evidence of poor fluid control. She still not using a compression pumps, she is not elevating due to her lift chair as well as  her hospital bed being broken, and she really is not keeping her legs up as much as they should be and also has been taking off her wraps. All this combined I think has led to poor fluid control and to be honest she may be somewhat volume overloaded in general as well. I recommend that she may need to contact your physician to see if a prescription for a diuretic would be beneficial in their opinion. As long as this is safe I think it would likely help her. 09/29/18 on evaluation today patient's left lower extremity actually appears to be doing quite a bit better. At least compared to last time that I saw her. She still has a large area where she is draining from but there's a lot of new skin speckled trout and in fact there's more new skin that there are open areas of weeping and drainage at this point. This is good news. With regard to the right lower extremity this is doing much better with the only open area that I really see being a dry spot on the right lateral ankle currently. Fortunately there's no signs of active infection at this time which is good news. No fevers, chills, nausea, or vomiting noted at this time. The patient seems somewhat stressed and overwhelmed during the visit today she was very lethargic as such. She does and she is not taking any pain medications at this point. Apparently according to her husband are also in the process of moving which is probably taking its toll on her as well. 10/06/18 on evaluation today patient appears to be doing rather well in regard to  her lower extremities compared to last evaluation. Fortunately there's no signs of active infection. She tells me she did have an appointment with her primary. Nonetheless he was concerned that the wounds were somewhat deep based on pictures but we never actually saw her legs. She states that he had her somewhat worried due to the fact that she was fearing now that she was [place] have to have an amputation. With  that being said based on what I'm seeing check she looks better this week that she has the last two times I've seen her with much less drainage I'm actually pleased in this regard. That doesn't mean that she's out of the water but again I do not think what the point of talking about education at all in regard to her leg. She is very happy to hear this. She is also not having as much pain as she was having last week. 10/13/18 unfortunately on evaluation today patient still continues to have a significant amount of drainage she's not letting home health actually apply the compression dressings at this point. She's trying to use of Juxta-Lite of the top of Kerlex and the second layer of the three layer compression wrap. With that being said she just does not seem to be making as good a progress as I would expect if she was having the compression applied and in place on a regular basis. No fevers, chills, nausea, or vomiting noted at this time. 10/20/18 on evaluation today patient appears to be doing a little better in regard to her bilateral lower extremity ulcers. In fact the right lower extremity seems to be healed she doesn't even have any openings at this point left lower extremity though still somewhat macerated seems to be showing signs of new skin growth at multiple locations throughout. Fortunately there's no evidence of active infection at this time. No fevers, chills, nausea, or vomiting noted at this time. 10/27/18 on evaluation today patient appears to be doing much better in regard to her left lower Trinity ulcer. She's been tolerating the laptop complication and has minimal drainage noted at this point. Fortunately there's no signs of active infection at this time. No fevers, chills, nausea, or vomiting noted at this time. 11/03/18 on evaluation today patient actually appears to be doing excellent in regard to her left lower extremity. She is having very little drainage at this point there  does not appear to be any significant signs of infection overall very pleased with how things have gone. She is likewise extremely pleased still and seems to be making wonderful progress week to week. I do believe antibiotics were helpful for her. Her primary care provider did place on amateur clean since I last saw her. 11/17/18 on evaluation today patient appears to be doing worse in regard to her bilateral lower extremities at this point. She is been tolerating the dressing changes without complication. With that being said she typically takes the Coban off fairly quickly upon arriving home even after being seen here in the clinic and does not allow home health reapply command as part of the dressing at home. Therefore she said no compression essentially since I last saw her as best I can tell. With that being said I think it shows and how much swelling she has in the open wounds that are noted at this point. Fortunately there's no signs of infection but unfortunately if she doesn't get this under control I think she will end up with infection and more significant  issues. 11/24/18 on evaluation today patient actually appears to be doing somewhat better in regard to her bilateral lower extremities. 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 Nonetheless that is doing better currently so she should be able to attempt reinitiating the lymphedema pumps at this time. No fevers, chills, nausea, or vomiting noted at this time. 12/01/18 upon evaluation today patient's left lower extremity appears to be doing quite well unfortunately her right lower extremity is not doing nearly as well. She has been tolerating the dressing changes without complication unfortunately she did not keep a wrap on the right at this time. Nonetheless I believe this has led to  increased swelling and weeping in the world is actually much larger than during the last evaluation with her. 12/08/18 on evaluation today patient appears to be doing about the same at this point in regard to her right lower extremity. There is some more palatable to touch I'm concerned about the possibility of there being some infection although I think the main issue is she's not keeping her compression wrap on which in turn is not allowing this area to heal appropriately. 12/22/18 on evaluation today patient appears to be doing better in regard to left lower extremity unfortunately significantly worse in regard to the right lower extremity. The areas of blistering and necrotic superficial tissue have spread and again this does not really appear to be signs of infection and all she just doesn't seem to be doing nearly as well is what she has been in the past. Overall I feel like the Augmentin did absolutely nothing for her she doesn't seem to have any infection again I really didn't think so last time either is more of a potential preventative measure and hoping that this would make some difference but I think the main issue is she's not wearing her compression. She tells me she cannot wear the [place] been we put on she takes it off pretty much upon getting home. Subsequently she worshiped Juxta-Lite when I questioned her about how often she wears it this is no more than three hours a day obviously that leaves 21 hours that she has no compression and this is obviously not doing well for her. Overall I'm concerned that if things continue to worsen she is at great risk of both infection as well as losing her leg. 01/05/19 on evaluation today patient appears to be doing well in regard to her left lower extremity which he is allowing [place] to wrap and not so well with regard to her right lower extremity which she is not allowing [place] to really wrap and keep the wrap on. She states that it hurts too badly  whenever it's wrapped and she ends up having to take it off. She's been using the Juxta-Lite she tells me up to six hours a day although I question whether or not that's really been the case to be honest. Previously she told me three hours today nonetheless obviously the legs as long as the wrap is doing great when she is not is doing much more poorly. 01/12/2019 on evaluation today patient actually appears to be doing a little better in my opinion with regard to her right lower extremity ulcer. She has a small open area on the left lower extremity unfortunately but again this I think  is part of the normal fluctuation of what she is going to have to expect with regard to her legs especially when she is not using her lymphedema pumps on a regular basis. Subsequently based on what I am seeing today I think that she does seem to be doing slightly better with regard to her right lower extremity she did see her primary care provider on Monday they felt she had an infection and placed her on 2 antibiotics. Both Cipro and clindamycin. Subsequently again she seems possibly to be doing a little bit better in regards to the right lower extremity she also tells me however she has been wearing the compression wrap over the past week since I spoke with her as well that is a Kerlix and Coban wrap on the right. No fevers, chills, nausea, vomiting, or diarrhea. 01/19/2019 on evaluation today patient appears to be doing better with regard to her bilateral lower extremities especially the right. I feel like the compression has been beneficial for her which is great news. She did get a call from her primary care provider on her way here today telling her that she did have methicillin-resistant Staphylococcus aureus and he was calling in a couple new antibiotics for her including a ointment to be applied she tells me 3 times a day. With that being said this sounds like likely to be Bactroban which I think could be  applied with each dressing/wrap change but I would not be able to accommodate her applying this 3 times a day. She is in agreement with the least doing this we will add that to her orders today. 01/26/2019 on evaluation today patient actually appears to be doing much better with regard to her right lower extremity. Her left lower extremity is also doing quite well all things considering. Fortunately there is no evidence of active infection at this time. No fevers, chills, nausea, vomiting, or diarrhea. 02/02/2019 on evaluation today patient appears to be doing much better compared to her last evaluation. Little by little off like her right leg is returning more towards normal. There does not appear to be any signs of active infection and overall she seems to be doing quite well which is great news. I am very pleased in this regard. No fevers, chills, nausea, vomiting, or diarrhea. 02/09/2019 upon evaluation today patient appears to be doing better with regard to her bilateral lower extremities. She has been tolerating the dressing changes without complication. Fortunately there is no signs of active infection at this time. No fevers, chills, nausea, vomiting, or diarrhea. 02/23/2019 on evaluation today patient actually appears to be doing quite well with regard to her bilateral lower extremities. She has been tolerating the dressing changes without complication. She is even used her pumps one time and states that she really felt like it felt good. With that being said she seems to be in good spirits and her legs appear to be doing excellent. 03/09/2019 on evaluation today patient appears to be doing well with regard to her right lower extremity there are no open wounds at this time she is having some discomfort but I feel like this is more neuropathy than anything. With regard to her left lower extremity she had several areas scattered around that she does have some weeping and drainage from but again  overall she does not appear to be having any significant issues and no evidence of infection at this time which is good news. 03/23/2019 on evaluation today patient appears to be  doing well with regard to her right lower extremity which she tells me is still close she is using her juxta light here. Her left lower extremity she mainly just has an area on the foot which is still slightly draining although this also is doing great. Overall very pleased at this time. Patient History Information obtained from Patient. Family History Cancer - Siblings, Hypertension - Siblings, Stroke - Father, No family history of Diabetes, Heart Disease, Hereditary Spherocytosis, Kidney Disease, Lung Disease, Seizures, Thyroid Problems, Tuberculosis. Social History Never smoker, Marital Status - Married, Alcohol Use - Never, Drug Use - No History, Caffeine Use - Never. Medical History Eyes Denies history of Cataracts, Glaucoma, Optic Neuritis Ear/Nose/Mouth/Throat Denies history of Chronic sinus problems/congestion, Middle ear problems Hematologic/Lymphatic Denies history of Anemia, Hemophilia, Human Immunodeficiency Virus, Lymphedema, Sickle Cell Disease Respiratory Patient has history of Asthma Denies history of Aspiration, Chronic Obstructive Pulmonary Disease (COPD), Pneumothorax, Sleep Apnea, Tuberculosis Cardiovascular Patient has history of Hypertension, Peripheral Arterial Disease, Peripheral Venous Disease - chronic Denies history of Angina, Arrhythmia, Congestive Heart Failure, Coronary Artery Disease, Deep Vein Thrombosis, Hypotension, Myocardial Infarction, Phlebitis, Vasculitis Endocrine Patient has history of Type II Diabetes Genitourinary Denies history of End Stage Renal Disease Immunological Denies history of Lupus Erythematosus, Raynaudoos, Scleroderma Musculoskeletal Patient has history of Gout, Osteoarthritis Denies history of Rheumatoid Arthritis, Osteomyelitis Neurologic Denies  history of Dementia, Neuropathy, Quadriplegia, Paraplegia, Seizure Disorder Psychiatric Denies history of Anorexia/bulimia, Confinement Anxiety Hospitalization/Surgery History - leg cellulitis. Medical And Surgical History Notes Constitutional Symptoms (General Health) morbid obesity, wheelchair dependent Integumentary (Skin) LLL cellulitis , wound cx grew E. Coli Psychiatric insomnia , adjustment disorder, depression with anxiety Review of Systems (ROS) Constitutional Symptoms (General Health) Denies complaints or symptoms of Fatigue, Fever, Chills, Marked Weight Change. Respiratory Denies complaints or symptoms of Chronic or frequent coughs, Shortness of Breath. Cardiovascular Denies complaints or symptoms of Chest pain. Psychiatric Denies complaints or symptoms of Claustrophobia, Suicidal. Objective Constitutional Obese and well-hydrated in no acute distress. Vitals Time Taken: 10:27 AM, Height: 62 in, Weight: 335 lbs, BMI: 61.3, Temperature: 98.3 F, Pulse: 113 bpm, Respiratory Rate: 18 breaths/min, Blood Pressure: 171/92 mmHg, Capillary Blood Glucose: 184 mg/dl. General Notes: CBg per patient Respiratory normal breathing without difficulty. clear to auscultation bilaterally. Cardiovascular regular rate and rhythm with normal S1, S2. Psychiatric this patient is able to make decisions and demonstrates good insight into disease process. Alert and Oriented x 3. pleasant and cooperative. General Notes: Patient's wound bed currently showed signs of really just being very superficial as far as what was draining on the top of the foot and right at the base of her fourth and third toes. Really when I got to look into this further she did have some thick skin that was removed from the surface just mechanically and underneath it appears to be healed for the most part there was just a small area of leaking which I think may have a better chance of closing now that we got a lot of this  off. Fortunately there is no signs of active infection at this time which is excellent news. Integumentary (Hair, Skin) Wound #56 status is Open. Original cause of wound was Gradually Appeared. The wound is located on the Left,Posterior Ankle. The wound measures 0cm length x 0cm width x 0cm depth; 0cm^2 area and 0cm^3 volume. There is no tunneling or undermining noted. There is a none present amount of drainage noted. The wound margin is indistinct and nonvisible. There is no granulation within  the wound bed. There is no necrotic tissue within the wound bed. Wound #57 status is Open. Original cause of wound was Gradually Appeared. The wound is located on the Left,Dorsal Foot. The wound measures 0.2cm length x 0.3cm width x 0.1cm depth; 0.047cm^2 area and 0.005cm^3 volume. There is Fat Layer (Subcutaneous Tissue) Exposed exposed. There is no tunneling or undermining noted. There is a small amount of serosanguineous drainage noted. There is medium (34-66%) pink, pale granulation within the wound bed. There is a medium (34-66%) amount of necrotic tissue within the wound bed including Adherent Slough. Assessment Active Problems ICD-10 Type 2 diabetes mellitus with other skin ulcer Lymphedema, not elsewhere classified Chronic venous hypertension (idiopathic) with ulcer and inflammation of right lower extremity Chronic venous hypertension (idiopathic) with ulcer and inflammation of left lower extremity 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 leg with fat layer exposed Non-pressure chronic ulcer of other part of left lower leg with fat layer exposed Essential (primary) hypertension Morbid (severe) obesity due to excess calories Other specified anxiety disorders Weakness Procedures Wound #57 Pre-procedure diagnosis of Wound #57 is a Diabetic Wound/Ulcer of the Lower Extremity located on the Left,Dorsal Foot . There was a Three Layer Compression Therapy Procedure by Shawn Stall, RN. Post procedure Diagnosis Wound #57: Same as Pre-Procedure Plan Follow-up Appointments: Return Appointment in 2 weeks. Dressing Change Frequency: Wound #57 Left,Dorsal Foot: Change dressing three times week. Skin Barriers/Peri-Wound Care: Moisturizing lotion - to right leg at nightly after removing juxtalite Wound Cleansing: Wound #57 Left,Dorsal Foot: May shower with protection. Primary Wound Dressing: Wound #57 Left,Dorsal Foot: Calcium Alginate with Silver Other: - mupirocin to wound bed under alginate Secondary Dressing: Wound #57 Left,Dorsal Foot: Dry Gauze Edema Control: 3 Layer Compression System - Left Lower Extremity Avoid standing for long periods of time - walking is encouraged Elevate legs to the level of the heart or above for 30 minutes daily and/or when sitting, a frequency of: - do not sleep in chair with feet dangling Exercise regularly Support Garment 20-30 mm/Hg pressure to: - Juxtalite to right lower leg daily Segmental Compressive Device. - lymphedema pumps 60 minutes 1- 2 times per day Additional Orders / Instructions: Follow Nutritious Diet - To include vitamin A, vitamin C, and Zinc along with increased protein intake. Home Health: Continue Home Health skilled nursing for wound care. - [facility]. I would recommend currently that we go ahead and continue with the current wound care measures since the patient seems to be doing so well and again I am pleased with this. 2. I am in a suggest as well that we continue with the compression wrap for the left lower extremity as it seems to be keeping the edema under control her skin also appears to be doing much better compared to what it was previous. We have been using the 3 layer compression wrap. 3. I really would like for her to be using her pumps 1-2 times a day for 60 minutes per time she tells me it is too painful however she will not use those. We will see patient back for reevaluation in 1  week here in the clinic. If anything worsens or changes patient will contact our office for additional recommendations. Electronic Signature(s) Signed: 03/23/2019 11:38:30 AM By: Lenda Kelp PA-C Entered By: Lenda Kelp on 03/23/2019 11:38:29 -------------------------------------------------------------------------------- HxROS Details Patient Name: Date of Service: [name]. 03/23/2019 10:00 AM Medical Record [id number] Patient Account Number: [id number] Date of Birth/Sex: Treating  RN: 1951-04-09 (68 y.o. [name] Primary Care Provider: Fatima Sanger Other Clinician: Referring Provider: Treating Provider/Extender:Stone III, Flo Shanks, FRED Weeks in Treatment: 50 Information Obtained From Patient Constitutional Symptoms (General Health) Complaints and Symptoms: Negative for: Fatigue; Fever; Chills; Marked Weight Change Medical History: Past Medical History Notes: morbid obesity, wheelchair dependent Respiratory Complaints and Symptoms: Negative for: Chronic or frequent coughs; Shortness of Breath Medical History: Positive for: Asthma Negative for: Aspiration; Chronic Obstructive Pulmonary Disease (COPD); Pneumothorax; Sleep Apnea; Tuberculosis Cardiovascular Complaints and Symptoms: Negative for: Chest pain Medical History: Positive for: Hypertension; Peripheral Arterial Disease; Peripheral Venous Disease - chronic Negative for: Angina; Arrhythmia; Congestive Heart Failure; Coronary Artery Disease; Deep Vein Thrombosis; Hypotension; Myocardial Infarction; Phlebitis; Vasculitis Psychiatric Complaints and Symptoms: Negative for: Claustrophobia; Suicidal Medical History: Negative for: Anorexia/bulimia; Confinement Anxiety Past Medical History Notes: insomnia , adjustment disorder, depression with anxiety Eyes Medical History: Negative for: Cataracts; Glaucoma; Optic Neuritis Ear/Nose/Mouth/Throat Medical History: Negative for: Chronic  sinus problems/congestion; Middle ear problems Hematologic/Lymphatic Medical History: Negative for: Anemia; Hemophilia; Human Immunodeficiency Virus; Lymphedema; Sickle Cell Disease Endocrine Medical History: Positive for: Type II Diabetes Time with diabetes: 2005 Treated with: Insulin Blood sugar tested every day: No Blood sugar testing results: Breakfast: 155; Lunch: 155; Dinner: 160; Bedtime: 155 Genitourinary Medical History: Negative for: End Stage Renal Disease Immunological Medical History: Negative for: Lupus Erythematosus; Raynauds; Scleroderma Integumentary (Skin) Medical History: Past Medical History Notes: LLL cellulitis , wound cx grew E. Coli Musculoskeletal Medical History: Positive for: Gout; Osteoarthritis Negative for: Rheumatoid Arthritis; Osteomyelitis Neurologic Medical History: Negative for: Dementia; Neuropathy; Quadriplegia; Paraplegia; Seizure Disorder Immunizations Pneumococcal Vaccine: Received Pneumococcal Vaccination: Yes Implantable Devices No devices added Hospitalization / Surgery History Type of Hospitalization/Surgery leg cellulitis Family and Social History Cancer: Yes - Siblings; Diabetes: No; Heart Disease: No; Hereditary Spherocytosis: No; Hypertension: Yes - Siblings; Kidney Disease: No; Lung Disease: No; Seizures: No; Stroke: Yes - Father; Thyroid Problems: No; Tuberculosis: No; Never smoker; Marital Status - Married; Alcohol Use: Never; Drug Use: No History; Caffeine Use: Never; Financial Concerns: No; Food, Clothing or Shelter Needs: No; Support System Lacking: No; Transportation Concerns: No Physician Affirmation I have reviewed and agree with the above information. Electronic Signature(s) Signed: 03/24/2019 12:09:12 PM By: Zenaida Deed RN, BSN Signed: 03/27/2019 10:37:03 PM By: Lenda Kelp PA-C Entered By: Lenda Kelp on 03/23/2019  11:37:37 -------------------------------------------------------------------------------- SuperBill Details Patient Name: Date of Service: [name] 03/23/2019 Medical Record [id number] Patient Account Number: [id number] Date of Birth/Sex: Treating RN: 03-Mar-1951 (67 y.o. [name] Primary Care Provider: Fatima Sanger Other Clinician: Referring Provider: Treating Provider/Extender:Stone III, Flo Shanks, FRED Weeks in Treatment: 60 Diagnosis Coding ICD-10 Codes Code Description E11.622 Type 2 diabetes mellitus with other skin ulcer I89.0 Lymphedema, not elsewhere classified I87.331 Chronic venous hypertension (idiopathic) with ulcer and inflammation of right lower extremity I87.332 Chronic venous hypertension (idiopathic) with ulcer and inflammation of left lower extremity L97.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 leg with fat layer exposed L97.822 Non-pressure chronic ulcer of other part of left lower leg with fat layer exposed I10 Essential (primary) hypertension E66.01 Morbid (severe) obesity due to excess calories F41.8 Other specified anxiety disorders R53.1 Weakness Facility Procedures CPT4 Code Description: [id number] (Facility Use Only) [id number] - APPLY MULTLAY COMPRS LWR LT LEG Modifier: Quantity: 1 Physician Procedures Electronic Signature(s) Signed: 03/23/2019 11:38:47 AM By: Lenda Kelp PA-C Entered By: Lenda Kelp on 03/23/2019 11:38:46

## 2019-04-06 ENCOUNTER — Other Ambulatory Visit: Payer: Self-pay

## 2019-04-06 ENCOUNTER — Encounter (HOSPITAL_BASED_OUTPATIENT_CLINIC_OR_DEPARTMENT_OTHER): Payer: Medicare PPO | Admitting: Physician Assistant

## 2019-04-06 DIAGNOSIS — E11622 Type 2 diabetes mellitus with other skin ulcer: Secondary | ICD-10-CM | POA: Diagnosis not present

## 2019-04-06 NOTE — Progress Notes (Signed)
[name], [name] ([id number]) Visit Report for 04/06/2019 Chief Complaint Document Details Patient Name: Date of Service: [name], [name] 04/06/2019 10:00 AM Medical Record [id number] Patient Account Number: [id number] Date of Birth/Sex: Treating RN: [birth year] (68 y.o. Wynelle Link Primary Care Provider: Fatima Sanger Other Clinician: Referring Provider: Treating Provider/Extender:Stone III, Flo Shanks, FRED Weeks in Treatment: 18 Information Obtained from: Patient Chief Complaint Bilateral reoccurring LE ulcers Electronic Signature(s) Signed: 04/06/2019 6:13:43 PM By: Lenda Kelp PA-C Entered By: Lenda Kelp on 04/06/2019 10:34:32 -------------------------------------------------------------------------------- HPI Details Patient Name: Date of Service: [name]. 04/06/2019 10:00 AM Medical Record [id number] Patient Account Number: [id number] Date of Birth/Sex: Treating RN: [birth year] (67 y.o. Wynelle Link Primary Care Provider: Fatima Sanger Other Clinician: Referring Provider: Treating Provider/Extender:Stone III, Flo Shanks, FRED Weeks in Treatment: 3 History of Present Illness HPI Description: this patient has been seen a couple of times before and returns with recurrent problems to her right and left lower extremity with swelling and weeping ulcerations due to not wearing her compression stockings which she had been advised to do during her last discharge, at the end of June 2018. During her last visit the patient had had normal arterial blood flow and her venous reflux study did not necessitate any surgical intervention. She was recommended compression and elevation and wound care. After prolonged treatment the patient was completely healed but she has been noncompliant with wearing or compressions.. She was here last week with an outpatient return visit planned but the patient came in a very poor general condition with altered  mental status and was rushed to the ER on my request. With a history of hypertension, diabetes, TIA and right-sided weakness she was set up for an MRI on her brain and cervical spine and was sent to [facility]. Getting an MRI done was very difficult but once the workup was done she was found not to have any spinal stenosis, epidural abscess or hematoma or discitis. This was radiculopathy to be treated as an outpatient and she was given a follow-up appointment. Today she is feeling much better alert and oriented and has come to reevaluate her bilateral lower extremity lymphedema and ulceration 03/25/2017 -- she was admitted to the hospital on 03/16/2017 and discharged on 03/18/2017 with left leg cellulitis and ulceration. She was started on vancomycin and Zosyn and x-ray showed no bony involvement. She was treated for a cellulitis with IV antibiotics changed to Rocephin and Flagyl and was discharged on oral Keflex and doxycycline to complete a 7 day course. Last hemoglobin A1c was 7.1 and her other ailments including hypertension got asthma were appropriately treated. 05/06/2017 -- she is awaiting the right size of compression stockings from [place] but other than that has been doing well. ====== Old notes 68 year old patient was seen one time last October and was lost to follow-up. She has recurrent problems with weeping and ulceration of her left lower extremity and has swelling of this for several years. It has been worse for the last 2 months. Past medical history is significant for diabetes mellitus type 2, hypertension, gout, morbid obesity, depressive disorders, hiatal hernia, migraines, status post knee surgery, risk of a cholecystectomy, vaginal hysterectomy and breast biopsy. She is not a smoker. As noted before she has never had a venous duplex study and an arterial ABI study was attempted but the left lower extremity was noncompressible 10/01/2016 -- had a lower extremity venous  duplex reflux evaluation which showed no evidence of deep vein reflux  in the right or left lower extremity, and no evidence of great saphenous vein reflux more than 500 ms in the right or left lower extremity, and the left small saphenous vein is incompetent but no vascular consult was recommended. review of her electronic medical records noted that the ABI was checked in July 2017 where the right ABI was normal limits and the left ABI could not be ascertained due to pain with cuff pressure but the waveforms are within normal limits. her arterial duplex study scheduled for April 27. 10/08/2016 -- the patient has various reasons for not having a compression on and for the last 3 days she has had no compression on her left lower extremity either due to pain or the lack of nursing help. She does not use her juxta lites either. 10/15/2016 -- the patient did not keep her appointment for arterial duplex study on April 27 and I have asked her to reschedule this. Her pain is out of proportion with the physical findings and she continuously fails to wear a compression wraps and cuts them off because she says she cannot tolerate the pain. She does not use her juxta lites either. 10/22/2016 -- he has rescheduled her arterial duplex study to May 21 and her pain today is a bit better. She has not been wearing her juxta lites on her right lower extremity but now understands that she needs to do this. She did tolerate the to press compression wrap on her left lower extremity 10/29/2016 --arterial duplex study is scheduled for next week and overall she has been tolerating her compression wraps and also using her juxta lites on her right lower extremity 11/05/2016 -- the right ABI was 0.95 the left was 1.03. The digit TBI is on the right was 0.83 on the left was 0.92 and she had biphasic flow through these vessels. The impression was that of normal lower extremity arterial study. 11/12/2016 -- her pain is  minimal and she is doing very well overall. 11/26/2016 -- she has got juxta lites and her insurance will not pay for additional dual layer compression stockings. She is going to order some from [place]. 05/12/2017 -- her juxta lites are very old and too big for her and these have not been helping with compression. She did get 20-30 mm compression stockings from [place] but she and her husband are unable to put these on. I believe she will benefit from bilateral Extremit-ease, compression stockings and we will measure her for these today. 05/20/2017 -- lymphedema on the left lower extremity has increased a lot and she has a open ulceration as a result of this. The right lower extremity is looking pretty good. She has decided to by the compression stockings herself and will get reimbursed by the home health, at a later date. 05/27/2017 -- her sciatica is bothering her a lot and she thought her left leg pain was caused due to the compression wrap and hence removed it and has significant lymphedema. There is no inflammation on this left lower extremity. 06/17/17 on evaluation today patient appears to be doing very well and in fact is completely healed in regard to her ulcerations. Unfortunately however she does have continued issues with lymphedema nonetheless. We did order compression garments for her unfortunately she states that the size that she received were large although we ordered medium. Obviously this means she is not getting the optimal compression. She does not have those with her today and therefore we could not confirm and contact  the company on her behalf. Nonetheless she does state that she is going to have her husband bring them by tomorrow so that we can verify and then get in touch with the company. No fevers, chills, nausea, or vomiting noted at this time. Overall patient is doing better otherwise and I'm pleased with the progress she has made. 07/01/17 on evaluation today patient  appears to be doing very well in regard to her bilateral lower extremity she does not have any openings at this point which is excellent news. Overall I'm pleased with how things have progressed up to this time. Since she is doing so well we did order her compression which we are seeing her today to ensure that it fits her properly and everything is doing well in that regard and then subsequently she will be discharged. ============ Old Notes: 03/31/16 patient presents today for evaluation concerning open wounds that she has over the left medial ankle region as well as the left dorsal foot. She has previously had this occur although it has been healed for a number of months after having this for about a year prior until her hospitalization on 01/05/16. At that point in time it appears that she was admitted to the hospital for left lower extremity cellulitis and was placed on vancomycin and Zosyn at that point. Eventually upon discharge on January 15, 2016 she was placed on doxycycline at that point in time. Later on 03/27/16 positive wound culture growing Escherichia coli this was switched to amoxicillin. Currently she tells me that she is having pain radiated to be a 7 out of 10 which can be as high as 10 out of 10 with palpation and manipulation of the wound. This wound appears to be mainly venous in nature due to the bilateral lower extremity venous stasis/lymphedema. This is definitely much worse on her left than the right side. She does have type 1 diabetes mellitus, hypertension, morbid obesity, and is wheelchair dependent.during the course of the hospital stay a blood culture was also obtained and fortunately appeared negative. She also had an x-ray of the tibia/fibula on the left which showed no acute bone abnormality. Her white blood cell count which was performed last on 03/25/16 was 7.3, hemoglobin 12.8, protein 7.1, albumin 3.0. Her urine culture appeared to be negative for any specific  organisms. Patient did have a left lower extremity venous duplex evaluation for DVT. This did not include venous reflux studies but fortunately was negative for DVT. Patient also had arterial studies performed which revealed that she had a normal ABI on the right though this was unable to be performed on the left secondary to pain that she was having around the ankle region due to the wound. However it was stated on report that she had biphasic pulses and apparently good blood flow. ========== 06/03/17 she is here in follow-up evaluation for right lower extremity ulcer. The right lower sure he has healed but she has reopened to the left medial malleolus and dorsal foot with weeping. She is waiting for new compression garments to arrive from home health, the previous compression garments were ill fitting. We will continue with compression bilaterally and follow-up in 2 weeks Readmission: 08/05/17 on evaluation today patient appears to be doing somewhat poorly in regard to her left lower extremity especially although the right lower extremity has a small area which may no longer be open. She has been having a lot of drainage from the left lower extremity however he tells me that  she has not been able to use the EXTREMIT- EASE Compression at this point. She states that she did better and was able to actually apply the Juxta-Lite compression although the wound that she has is too large and therefore really does not compress which is why she cannot wear it at this point. She has no one who can help her put it on regular basis her son can sometimes but he's not able to do it most of the time. I do believe that's why she has begun to weave and have issues as she is currently yet again. No fevers, chills, nausea, or vomiting noted at this time. Patient is no evidence of dementia. 08/12/17 on evaluation today patient appears to still be doing fairly well in regard to the draining areas/weeping areas at  this point. With that being said she unfortunately did go to the ER yesterday due to what was felt to be possibly a cellulitis. They place her on doxycycline by mouth and discharge her home. She definitely was not admitted. With that being said she states she has had more discomfort which has been unusual for her even compared to prior times and she's had infections.08/12/17 on evaluation today patient appears to still be doing fairly well in regard to the draining areas/weeping areas at this point. With that being said she unfortunately did go to the ER yesterday due to what was felt to be possibly a cellulitis. They place her on doxycycline by mouth and discharge her home. She definitely was not admitted. With that being said she states she has had more discomfort which has been unusual for her even compared to prior times and she's had infections. 08/19/17 put evaluation today patient tells me that she's been having a lot of what sounds to be neuropathic type pain in regard to her left lower extremity. She has been using over-the-counter topical bins again which some believe. That in order to apply the she actually remove the wrap we put on her last Wednesday on Thursday. Subsequently she has not had anything on compression wise since that time. The good news is a lot of the weeping areas appear to have closed at this point again I believe she would do better with compression but we are struggling to get her to actually use what she needs to at this point. No fevers, chills, nausea, or vomiting noted at this time. 09/03/17 on evaluation today patient appears to be doing okay in regard to her lower extremities in regard to the lymphedema and weeping. Fortunately she does not seem to show any signs of infection at this point she does have a little bit of weeping occurring in the right medial malleolus area. With that being said this does not appear to be too significant which is good news. 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 She has severe skin damage secondary to both of these features involving the dorsal left foot and medial left ankle and lower leg. Still has open areas in the left anterior foot. The area on the right closed over. She uses her own juxta light stockings. She does not have an arterial issue 09/16/17 on evaluation today patient actually appears to be doing excellent in regard to her bilateral lower extremity swelling. The Juxta-Lite compression wrap seem to be doing very well for her. She has not however been using the portion that goes over her foot. Her left foot still is draining a  little bit not nearly as significant as it has been in the past but still I do believe that she likely needs to utilize the full wrap including the foot portion of this will improve as well. She also has been apparently putting on a significant amount of Vaseline which also think is not helpful for her. I recommended that if she feels she needs something for moisturizer Eucerin will probably be better. 09/30/17 on evaluation today patient presents with several new open areas in regard to her left lower extremity although these appear to be minimal and mainly seem to be more moisture breakdown than anything. Fortunately she does not seem to have any evidence of infection which is great news. She has been tolerating the dressing changes without complication we are using silver alginate on the foot she has been using AB pads to have the legs and using her Juxta-Lite compression which seems to be controlling her swelling very well. Overall I'm pleased with the poor way she has progressed. 10/14/17 on evaluation today patient appears to be doing better in regard to her left lower extremity areas of weeping. She does still have some discomfort although in general this does not appear to be as macerated and I think it  is progressing nicely. I do think she still needs to wear the foot portion of her Juxta-Lite in order to get the most benefit from the wrap obviously. She states she understands. Fortunately there does not appear to be evidence of infection at this time which is great news. 10/28/17 on evaluation today patient appears to be doing excellent in regard to her left lower extremity. She has just a couple areas that are still open and seem to be causing any trouble whatsoever. For that reason I think that she is definitely headed in the right direction the spots are very tiny compared to what we have been dealing with in the past. 11/11/17 on evaluation today patient appears to have a right lateral lower extremity ulcer that has opened since I last saw her. She states this is where the home health nurse that was coming out remove the dressing without wetting the alginate first. Nonetheless I do not know if this is indeed the case or not but more importantly we have not ordered home help to be coming out for her wounds at all. I'm unsure as to why they are coming out and we're gonna have to check on this and get things situated in that regard. With that being said we currently really do not need them to be coming out as the patient has been taking care of her leg herself without complication and no issues. In fact she was doing much better prior to nursing coming out. 11/25/17 on evaluation today patient actually appears to be doing fairly well in regard to her left lower extremity swelling. In fact she has very little area of weeping at this point there's just a small spot on the lateral portion of her right leg that still has me just a little bit more concerned as far as wanting to see this clear up before I discharge her to caring for this at home. Nonetheless overall she has made excellent progress. 12/09/17 on evaluation today patient appears to be doing rather well in regard to her lower extremity  edema. She does have some weeping still in the left lower extremity although the big area we were taking care of two weeks ago actually has closed and she has another  area of weeping on the left lower extremity immediately as well is the top of her foot. She does not currently have lymphedema pumps she has been wearing her compression daily on a regular basis as directed. With that being said I think she may benefit from lymphedema pumps. 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 We had a very difficult time getting and keeping this under control. 12/23/17 on evaluation today patient actually appears to be doing a little bit more poorly in regard to her bilateral lower extremities. She has been tolerating the Juxta-Lite compression wraps. Unfortunately she has two new ulcers on the right lower extremity and left lower Trinity ulceration seems to be larger. Obviously this is not good news. She has been tolerating the dressings without complication. 12/30/17 on evaluation today patient actually appears to be doing much better in regard to her bilateral lower extremity edema. She continues to have some issues with ulcerations and in fact there appears to be one spot on each leg where the wrap may have caused a little bit of a blister which is subsequently opened up at this point is given her pain. Fortunately it does not appear to be any evidence of infection which is good news. No fevers chills noted. 01/13/18 on evaluation today patient appears to be doing rather well in regard to her bilateral lower extremities. The dressings did get kind of stuck as far as the wound beds are concerned but again I think this is mainly due to the fact that she actually seems to be showing signs of healing which is good news. She's not having as much drainage  therefore she was having more of the dressing sticking. Nonetheless overall I feel like her swelling is dramatically down compared to previous. 01/20/18 on evaluation today patient unfortunately though she's doing better in most regards has a large blister on the left anterior lower extremity where she is draining quite significantly. Subsequently this is going to need debridement today in order to see what's underneath and ensure she does not continue to trapping fluid at this location. Nonetheless No fevers, chills, nausea, or vomiting noted at this time. 01/27/18 on evaluation today patient appears to be doing rather well at this point in regard to her right lower extremity there's just a very small area that she still has open at this point. With that being said I do believe that she is tolerating the compression wraps very well in making good progress. Home health is coming out at this point to see her. Her left lower extremity on the lateral portion is actually what still mainly open and causing her some discomfort for the most part 02/10/18 on evaluation today patient actually appears to be doing very well in regard to her right lower extremity were all the ulcers appear to be completely close. In regard to the left lower extremity she does have two areas still open and some leaking from the dorsal surface of her foot but this still seems to be doing much better to me in general. 02/24/18 on evaluation today patient actually appears to be doing much better in regard to her right lower extremity this is still completely healed. Her left lower extremity is also doing much better fortunately she has no evidence of infection. The one area that is gonna require some debridement is still  on the left anterior shin. Fortunately this is not hurting her as badly today. 03/10/18 on evaluation today patient appears to be doing better in some regards although she has a little bit more open area on the dorsal  foot and she also has some issues on the medial portion of the left lower extremity which is actually new and somewhat deep. With that being said there fortunately does not appear to be any significant signs of infection which is good news. No fevers, chills, nausea, or vomiting noted at this time. In general her swelling seems to be doing fairly well which is good news. 03/31/18 on evaluation today patient presents for follow-up concerning her left lower extremity lymphedema. Unfortunately she has been doing a little bit more poorly since I last saw her in regard to the amount of weeping that she is experiencing. She's also having some increased pain in the anterior shin location. Unfortunately I do not feel like the patient is making such good progress at this point a few weeks back she was definitely doing much better. 04/07/18 on evaluation today patient actually appears to be showing some signs of improvement as far as the left lower extremity is concerned. She has been tolerating the dressing changes and it does appear that the Drawtex did better for her. With that being said unfortunately home health is stating that they cannot obtain the Drawtex going forward. Nonetheless we're gonna have to check and see what they may be able to get the alginate they were using was getting stuck in causing new areas of skin being pulled all that with and subsequently weep and calls her to worsen overall this is the first time we've seen improvement at this time. 04/14/18 on evaluation today patient actually appears to be doing rather well at this point there does not appear to be any evidence of infection at this time and she is actually doing excellent in regard to the weeping in fact she almost has no openings remaining even compared to just last week this is a dramatic improvement. No fevers chills noted 04/21/18 evaluation today patient actually appears to be doing very well. She in fact is has a small  area on the posterior lower extremity location and she has a small area on the dorsal surface of her foot that are still open both of which are very close to closing. We're hoping this will be close shortly. She brought her Juxta-Lite wrap with her today hoping that would be able to put her in it unfortunately I don't think were quite at that point yet but we're getting closer. 04/28/18 upon evaluation today patient actually appears to be doing excellent in regard to her left lower extremity ulcer. In fact the region on the posterior lower extremity actually is much smaller than previously noted. Overall I'm very happy with the progress she has made. She again did bring her Juxta-Lite although we're not quite ready for that yet. 05/11/18 upon evaluation today patient actually appears to be doing in general fairly well in regard to her left lower Trinity. The swelling is very well controlled. With that being said she has a new area on the left anterior lower extremity as well as between the first and second toes of her left foot that was not present during the last evaluation. The region of her posterior left lower extremity actually appears to be almost completely healed. To be honest I'm very pleased with the way that stands. Nonetheless I do believe  that the lotion may be keeping the area to moist as far as her legs are concerned subsequently I'm gonna consider discontinuing that today. 05/26/18 on evaluation today patient appears to be doing rather well in regard to her left lower should be ulcers. In fact everything appears to be close except for a very small area on the left posterior lower extremity. Fortunately there does not appear to be any evidence of infection at this time. Overall very pleased with her progress. 06/02/18 and evaluation today patient actually appears to be doing very well in regard to her lower extremity ulcers. She has one small area that still continues to weep that I  think may benefit her being able to justify lotion and user Juxta-Lite wraps versus continued to wrap her. Nonetheless I think this is something we can definitely look into at this point. 06/23/18 on evaluation today patient unfortunately has openings of her bilateral lower extremities. In general she seems to be doing much worse than when I last saw her just as far as her overall health standpoint is concerned. She states that her discomfort is mainly due to neuropathy she's not having any other issues otherwise. No fevers, chills, nausea, or vomiting noted at this time. 06/30/18 on evaluation today patient actually appears to be doing a little worse in regard to her right lower extremity her left lower extremity of doing fairly well. Fortunately there is no sign of infection at this time. She has been tolerating the dressing changes without complication. Home health did not come out like they were supposed to for the appropriate wrap changes. They stated that they never received the orders from [place] which were fax. Nonetheless we will send a copy of the orders with the patient today as well. 07/07/18 on evaluation today patient appears to be doing much better in regard to lower extremities. She still has several openings bilaterally although since I last saw her her legs did show obvious signs of infection when she later saw her nurse. Subsequently a culture was obtained and she is been placed on Bactrim and Keflex. Fortunately things seem to be looking much better it does appear she likely had an infection. Again last week we'd even discussed it but again there really was not any obvious sign that she had infection therefore we held off on the antibiotics. Nonetheless I'm glad she's doing better today. 07/14/18 on evaluation today patient appears to be doing much better regarding her bilateral lower Trinity's. In fact on the right lower for me there's nothing open at this point there are some dry skin  areas at the sites where she had infection. Fortunately there is no evidence of systemic infection which is excellent news. No fevers chills noted 07/21/18 on evaluation today patient actually appears to be doing much better in regard to her left lower extremity ulcers. She is making good progress and overall I feel like she's improving each time I see her. She's having no pain I do feel like the infection is completely resolved which is excellent news. No fevers, chills, nausea, or vomiting noted at this time. 07/28/18 on evaluation today patient appears to be doing very well in regard to her left lower [employer]. Everything seems to be showing signs of improvement which is excellent news. Overall very pleased with the progress that has been made. Fortunately there's no evidence of active infection at this time also excellent news. 08/04/18 on evaluation today patient appears to be doing more poorly in regard  to her bilateral lower extremities. She has two new areas open up on the right and these were completely closed as of last week. She still has the two spots on the left which in my pinion seem to be doing better. Fortunately there's no evidence of infection again at this point. 08/11/18 on evaluation today patient actually appears to be doing very well in regard to her bilateral lower Trinity wounds that all seem to be doing better and are measures smaller today. Fortunately there's no signs of infection. No fevers, chills, nausea, or vomiting noted at this time. 08/18/18 on evaluation today patient actually appears to be doing about the same inverter bilateral lower extremities. She continues to have areas that blistering open as was drain that fortunately nothing too significant. Overall I feel like Drawtex may have done better for her however compared to the collagen. 08/25/18 on evaluation today patient appears to be doing a little bit more poorly today even compared to last time I saw  her. Again I'm not exactly sure why she's making worse progress over the past several weeks. I'm beginning to wonder if there is some kind of underlying low level infection causing this issue. I did actually take a culture from the left anterior lower extremity but it was a new wound draining quite a bit at this point. Unfortunately she also seems to be having more pain which is what also makes me worried about the possibility of infection. This is despite never erythema noted at this point. 09/01/18 on evaluation today patient actually appears to be doing a little worse even compared to last week in regard to bilateral lower extremities. She did go to the hospital on the 16th was given a dose of IV Zosyn and then discharged with a recommendation to continue with the Bactrim that I previously prescribed for her. Nonetheless she is still having a lot of discomfort she tells me as well at this time. This is definitely unfortunate. No fevers, chills, nausea, or vomiting noted at this time. 09/08/18 on evaluation today patient's bilateral lower extremities actually appear to be shown signs of improvement which is good news. Fortunately there does not appear to be any signs of active infection I think the anabiotic is helping in this regard. Overall I'm very pleased with how she is progressing. 09/15/18 patient was actually seen in ER yesterday due to her legs as well unfortunately. She states she's been having a lot of pain and discomfort as well as a lot of drainage. Upon inspection today the patient does have a lot of swelling and drainage I feel like this is more related to lymphedema and poor fluid control than it is to infection based on what I'm seeing. The physician in the emergency department also doubted that the patient was having a significant infection nonetheless I see no evidence of infection obvious at this point although I do see evidence of poor fluid control. She still not using a  compression pumps, she is not elevating due to her lift chair as well as her hospital bed being broken, and she really is not keeping her legs up as much as they should be and also has been taking off her wraps. All this combined I think has led to poor fluid control and to be honest she may be somewhat volume overloaded in general as well. I recommend that she may need to contact your physician to see if a prescription for a diuretic would be beneficial in their opinion.  As long as this is safe I think it would likely help her. 09/29/18 on evaluation today patient's left lower extremity actually appears to be doing quite a bit better. At least compared to last time that I saw her. She still has a large area where she is draining from but there's a lot of new skin speckled trout and in fact there's more new skin that there are open areas of weeping and drainage at this point. This is good news. With regard to the right lower extremity this is doing much better with the only open area that I really see being a dry spot on the right lateral ankle currently. Fortunately there's no signs of active infection at this time which is good news. No fevers, chills, nausea, or vomiting noted at this time. The patient seems somewhat stressed and overwhelmed during the visit today she was very lethargic as such. She does and she is not taking any pain medications at this point. Apparently according to her husband are also in the process of moving which is probably taking its toll on her as well. 10/06/18 on evaluation today patient appears to be doing rather well in regard to her lower extremities compared to last evaluation. Fortunately there's no signs of active infection. She tells me she did have an appointment with her primary. Nonetheless he was concerned that the wounds were somewhat deep based on pictures but we never actually saw her legs. She states that he had her somewhat worried due to the fact that  she was fearing now that she was [place] have to have an amputation. With that being said based on what I'm seeing check she looks better this week that she has the last two times I've seen her with much less drainage I'm actually pleased in this regard. That doesn't mean that she's out of the water but again I do not think what the point of talking about education at all in regard to her leg. She is very happy to hear this. She is also not having as much pain as she was having last week. 10/13/18 unfortunately on evaluation today patient still continues to have a significant amount of drainage she's not letting home health actually apply the compression dressings at this point. She's trying to use of Juxta-Lite of the top of Kerlex and the second layer of the three layer compression wrap. With that being said she just does not seem to be making as good a progress as I would expect if she was having the compression applied and in place on a regular basis. No fevers, chills, nausea, or vomiting noted at this time. 10/20/18 on evaluation today patient appears to be doing a little better in regard to her bilateral lower extremity ulcers. In fact the right lower extremity seems to be healed she doesn't even have any openings at this point left lower extremity though still somewhat macerated seems to be showing signs of new skin growth at multiple locations throughout. Fortunately there's no evidence of active infection at this time. No fevers, chills, nausea, or vomiting noted at this time. 10/27/18 on evaluation today patient appears to be doing much better in regard to her left lower Trinity ulcer. She's been tolerating the laptop complication and has minimal drainage noted at this point. Fortunately there's no signs of active infection at this time. No fevers, chills, nausea, or vomiting noted at this time. 11/03/18 on evaluation today patient actually appears to be doing excellent in regard to  her left  lower extremity. She is having very little drainage at this point there does not appear to be any significant signs of infection overall very pleased with how things have gone. She is likewise extremely pleased still and seems to be making wonderful progress week to week. I do believe antibiotics were helpful for her. Her primary care provider did place on amateur clean since I last saw her. 11/17/18 on evaluation today patient appears to be doing worse in regard to her bilateral lower extremities at this point. She is been tolerating the dressing changes without complication. With that being said she typically takes the Coban off fairly quickly upon arriving home even after being seen here in the clinic and does not allow home health reapply command as part of the dressing at home. Therefore she said no compression essentially since I last saw her as best I can tell. With that being said I think it shows and how much swelling she has in the open wounds that are noted at this point. Fortunately there's no signs of infection but unfortunately if she doesn't get this under control I think she will end up with infection and more significant issues. 11/24/18 on evaluation today patient actually appears to be doing somewhat better in regard to her bilateral lower extremities. 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 Nonetheless that is doing better currently so she should be able to attempt reinitiating the lymphedema pumps at this time. No fevers, chills, nausea, or vomiting noted at this time. 12/01/18 upon evaluation today patient's left lower extremity appears to be doing quite well unfortunately her right lower extremity is not doing nearly as well. She has been tolerating the dressing changes without complication unfortunately she did not keep a wrap on the  right at this time. Nonetheless I believe this has led to increased swelling and weeping in the world is actually much larger than during the last evaluation with her. 12/08/18 on evaluation today patient appears to be doing about the same at this point in regard to her right lower extremity. There is some more palatable to touch I'm concerned about the possibility of there being some infection although I think the main issue is she's not keeping her compression wrap on which in turn is not allowing this area to heal appropriately. 12/22/18 on evaluation today patient appears to be doing better in regard to left lower extremity unfortunately significantly worse in regard to the right lower extremity. The areas of blistering and necrotic superficial tissue have spread and again this does not really appear to be signs of infection and all she just doesn't seem to be doing nearly as well is what she has been in the past. Overall I feel like the Augmentin did absolutely nothing for her she doesn't seem to have any infection again I really didn't think so last time either is more of a potential preventative measure and hoping that this would make some difference but I think the main issue is she's not wearing her compression. She tells me she cannot wear the [place] been we put on she takes it off pretty much upon getting home. Subsequently she worshiped Juxta-Lite when I questioned her about how often she wears it this is no more than three hours a day obviously that leaves 21  hours that she has no compression and this is obviously not doing well for her. Overall I'm concerned that if things continue to worsen she is at great risk of both infection as well as losing her leg. 01/05/19 on evaluation today patient appears to be doing well in regard to her left lower extremity which he is allowing [place] to wrap and not so well with regard to her right lower extremity which she is not allowing [place] to really wrap  and keep the wrap on. She states that it hurts too badly whenever it's wrapped and she ends up having to take it off. She's been using the Juxta-Lite she tells me up to six hours a day although I question whether or not that's really been the case to be honest. Previously she told me three hours today nonetheless obviously the legs as long as the wrap is doing great when she is not is doing much more poorly. 01/12/2019 on evaluation today patient actually appears to be doing a little better in my opinion with regard to her right lower extremity ulcer. She has a small open area on the left lower extremity unfortunately but again this I think is part of the normal fluctuation of what she is going to have to expect with regard to her legs especially when she is not using her lymphedema pumps on a regular basis. Subsequently based on what I am seeing today I think that she does seem to be doing slightly better with regard to her right lower extremity she did see her primary care provider on Monday they felt she had an infection and placed her on 2 antibiotics. Both Cipro and clindamycin. Subsequently again she seems possibly to be doing a little bit better in regards to the right lower extremity she also tells me however she has been wearing the compression wrap over the past week since I spoke with her as well that is a Kerlix and Coban wrap on the right. No fevers, chills, nausea, vomiting, or diarrhea. 01/19/2019 on evaluation today patient appears to be doing better with regard to her bilateral lower extremities especially the right. I feel like the compression has been beneficial for her which is great news. She did get a call from her primary care provider on her way here today telling her that she did have methicillin-resistant Staphylococcus aureus and he was calling in a couple new antibiotics for her including a ointment to be applied she tells me 3 times a day. With that being said this sounds  like likely to be Bactroban which I think could be applied with each dressing/wrap change but I would not be able to accommodate her applying this 3 times a day. She is in agreement with the least doing this we will add that to her orders today. 01/26/2019 on evaluation today patient actually appears to be doing much better with regard to her right lower extremity. Her left lower extremity is also doing quite well all things considering. Fortunately there is no evidence of active infection at this time. No fevers, chills, nausea, vomiting, or diarrhea. 02/02/2019 on evaluation today patient appears to be doing much better compared to her last evaluation. Little by little off like her right leg is returning more towards normal. There does not appear to be any signs of active infection and overall she seems to be doing quite well which is great news. I am very pleased in this regard. No fevers, chills, nausea, vomiting, or diarrhea. 02/09/2019  upon evaluation today patient appears to be doing better with regard to her bilateral lower extremities. She has been tolerating the dressing changes without complication. Fortunately there is no signs of active infection at this time. No fevers, chills, nausea, vomiting, or diarrhea. 02/23/2019 on evaluation today patient actually appears to be doing quite well with regard to her bilateral lower extremities. She has been tolerating the dressing changes without complication. She is even used her pumps one time and states that she really felt like it felt good. With that being said she seems to be in good spirits and her legs appear to be doing excellent. 03/09/2019 on evaluation today patient appears to be doing well with regard to her right lower extremity there are no open wounds at this time she is having some discomfort but I feel like this is more neuropathy than anything. With regard to her left lower extremity she had several areas scattered around that she  does have some weeping and drainage from but again overall she does not appear to be having any significant issues and no evidence of infection at this time which is good news. 03/23/2019 on evaluation today patient appears to be doing well with regard to her right lower extremity which she tells me is still close she is using her juxta light here. Her left lower extremity she mainly just has an area on the foot which is still slightly draining although this also is doing great. Overall very pleased at this time. 04/06/2019 patient appears to be doing a little bit worse in regard to her left lower extremity upon evaluation today. She feels like this could be becoming infected again which she had issues with previous. Fortunately there is no signs of systemic infection but again this is always a struggle with her with her legs she will go from doing well to not so well in a very short amount of time. Electronic Signature(s) Signed: 04/06/2019 6:13:43 PM By: Lenda Kelp PA-C Entered By: Lenda Kelp on 04/06/2019 10:57:40 -------------------------------------------------------------------------------- Physical Exam Details Patient Name: Date of Service: [name], [name] 04/06/2019 10:00 AM Medical Record [id number] Patient Account Number: [id number] Date of Birth/Sex: Treating RN: 06-Feb-1951 (68 y.o. Wynelle Link Primary Care Provider: Fatima Sanger Other Clinician: Referring Provider: Treating Provider/Extender:Stone III, Flo Shanks, FRED Weeks in Treatment: 27 Constitutional Obese and well-hydrated in no acute distress. Respiratory normal breathing without difficulty. clear to auscultation bilaterally. Cardiovascular regular rate and rhythm with normal S1, S2. Psychiatric this patient is able to make decisions and demonstrates good insight into disease process. Alert and Oriented x 3. pleasant and cooperative. Notes Upon inspection today patient's wound bed  actually showed signs of good granulation at this point in some areas she did have other areas where the skin was lifting up and she had a small sore under several locations. Unfortunately this is causing her discomfort as well and I am concerned about the possibility of infection I do believe that we will need to see about addressing this more effectively. She did well with clindamycin the last time her primary care provider gave her a prescription for this. That was coupled with the topical antibiotic ointment. Electronic Signature(s) Signed: 04/06/2019 6:13:43 PM By: Lenda Kelp PA-C Entered By: Lenda Kelp on 04/06/2019 10:58:19 -------------------------------------------------------------------------------- Physician Orders Details Patient Name: Date of Service: [name]. 04/06/2019 10:00 AM Medical Record [id number] Patient Account Number: [id number] Date of Birth/Sex: Treating RN: March 20, 1951 (67 y.o. Wynelle Link Primary Care  Provider: Fatima Sanger Other Clinician: Referring Provider: Treating Provider/Extender:Stone III, Flo Shanks, FRED Weeks in Treatment: 93 Verbal / Phone Orders: No Diagnosis Coding ICD-10 Coding Code Description E11.622 Type 2 diabetes mellitus with other skin ulcer I89.0 Lymphedema, not elsewhere classified I87.331 Chronic venous hypertension (idiopathic) with ulcer and inflammation of right lower extremity I87.332 Chronic venous hypertension (idiopathic) with ulcer and inflammation of left lower extremity L97.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 leg with fat layer exposed L97.822 Non-pressure chronic ulcer of other part of left lower leg with fat layer exposed I10 Essential (primary) hypertension E66.01 Morbid (severe) obesity due to excess calories F41.8 Other specified anxiety disorders R53.1 Weakness Follow-up Appointments Return Appointment in 2 weeks. Dressing Change Frequency Wound #57  Left,Dorsal Foot Change dressing three times week. Skin Barriers/Peri-Wound Care Moisturizing lotion - to right leg at nightly after removing juxtalite Wound Cleansing Wound #57 Left,Dorsal Foot May shower with protection. Primary Wound Dressing Wound #57 Left,Dorsal Foot Calcium Alginate with Silver Other: - Gentamicin ointment to wound bed under alginate (use Mupirocin in clinic) Wound #58 Left,Medial Ankle Calcium Alginate with Silver Other: - Gentamicin ointment to wound bed under alginate (use Mupirocin in clinic) Secondary Dressing Wound #57 Left,Dorsal Foot Dry Gauze Wound #58 Left,Medial Ankle Dry Gauze Edema Control 3 Layer Compression System - Left Lower Extremity Avoid standing for long periods of time - walking is encouraged Elevate legs to the level of the heart or above for 30 minutes daily and/or when sitting, a frequency of: - do not sleep in chair with feet dangling Exercise regularly Support Garment 20-30 mm/Hg pressure to: - Juxtalite to right lower leg daily Segmental Compressive Device. - lymphedema pumps 60 minutes 1- 2 times per day Additional Orders / Instructions Follow Nutritious Diet - To include vitamin A, vitamin C, and Zinc along with increased protein intake. Home Health Continue Home Health skilled nursing for wound care. - [facility] Patient Medications Allergies: ACE Inhibitors, metformin, propofol Notifications Medication Indication Start End clindamycin HCl 04/06/2019 DOSE 1 - oral 300 mg capsule - 1 capsule oral taken 3 times a day for 15 days gentamicin 04/06/2019 DOSE topical 0.1 % cream - cream topical applied as directed to the open wound locations with each wrap change Electronic Signature(s) Signed: 04/06/2019 11:01:47 AM By: Lenda Kelp PA-C Entered By: Lenda Kelp on 04/06/2019 11:01:44 -------------------------------------------------------------------------------- Problem List Details Patient Name: Date of  Service: [name]. 04/06/2019 10:00 AM Medical Record [id number] Patient Account Number: [id number] Date of Birth/Sex: Treating RN: May 08, 1951 (67 y.o. Wynelle Link Primary Care Provider: Fatima Sanger Other Clinician: Referring Provider: Treating Provider/Extender:Stone III, Flo Shanks, FRED Weeks in Treatment: 58 Active Problems ICD-10 Evaluated Encounter Code Description Active Date Today Diagnosis E11.622 Type 2 diabetes mellitus with other skin ulcer 08/05/2017 No Yes I89.0 Lymphedema, not elsewhere classified 08/05/2017 No Yes I87.331 Chronic venous hypertension (idiopathic) with ulcer 08/05/2017 No Yes and inflammation of right lower extremity I87.332 Chronic venous hypertension (idiopathic) with ulcer 08/05/2017 No Yes and inflammation of left lower extremity L97.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 08/05/2017 No Yes leg with fat layer exposed L97.822 Non-pressure chronic ulcer of other part of left lower 08/05/2017 No Yes leg with fat layer exposed I10 Essential (primary) hypertension 08/05/2017 No Yes E66.01 Morbid (severe) obesity due to excess calories 08/05/2017 No Yes F41.8 Other specified anxiety disorders 08/05/2017 No Yes R53.1 Weakness 08/05/2017 No Yes Inactive Problems Resolved Problems Electronic Signature(s) Signed: 04/06/2019 6:13:43 PM By:  Linwood Dibbles, Tsutomu Barfoot PA-C Entered By: Lenda Kelp on 04/06/2019 10:34:18 -------------------------------------------------------------------------------- Progress Note Details Patient Name: Date of Service: [name], [name] 04/06/2019 10:00 AM Medical Record [id number] Patient Account Number: [id number] Date of Birth/Sex: Treating RN: [birth year] (68 y.o. Wynelle Link Primary Care Provider: Fatima Sanger Other Clinician: Referring Provider: Treating Provider/Extender:Stone III, Flo Shanks, FRED Weeks in Treatment: 50 Subjective Chief Complaint Information obtained from  Patient Bilateral reoccurring LE ulcers History of Present Illness (HPI) this patient has been seen a couple of times before and returns with recurrent problems to her right and left lower extremity with swelling and weeping ulcerations due to not wearing her compression stockings which she had been advised to do during her last discharge, at the end of June 2018. During her last visit the patient had had normal arterial blood flow and her venous reflux study did not necessitate any surgical intervention. She was recommended compression and elevation and wound care. After prolonged treatment the patient was completely healed but she has been noncompliant with wearing or compressions.. She was here last week with an outpatient return visit planned but the patient came in a very poor general condition with altered mental status and was rushed to the ER on my request. With a history of hypertension, diabetes, TIA and right-sided weakness she was set up for an MRI on her brain and cervical spine and was sent to [facility]. Getting an MRI done was very difficult but once the workup was done she was found not to have any spinal stenosis, epidural abscess or hematoma or discitis. This was radiculopathy to be treated as an outpatient and she was given a follow-up appointment. Today she is feeling much better alert and oriented and has come to reevaluate her bilateral lower extremity lymphedema and ulceration 03/25/2017 -- she was admitted to the hospital on 03/16/2017 and discharged on 03/18/2017 with left leg cellulitis and ulceration. She was started on vancomycin and Zosyn and x-ray showed no bony involvement. She was treated for a cellulitis with IV antibiotics changed to Rocephin and Flagyl and was discharged on oral Keflex and doxycycline to complete a 7 day course. Last hemoglobin A1c was 7.1 and her other ailments including hypertension got asthma were appropriately treated. 05/06/2017 -- she  is awaiting the right size of compression stockings from [place] but other than that has been doing well. ====== Old notes 68 year old patient was seen one time last October and was lost to follow-up. She has recurrent problems with weeping and ulceration of her left lower extremity and has swelling of this for several years. It has been worse for the last 2 months. Past medical history is significant for diabetes mellitus type 2, hypertension, gout, morbid obesity, depressive disorders, hiatal hernia, migraines, status post knee surgery, risk of a cholecystectomy, vaginal hysterectomy and breast biopsy. She is not a smoker. As noted before she has never had a venous duplex study and an arterial ABI study was attempted but the left lower extremity was noncompressible 10/01/2016 -- had a lower extremity venous duplex reflux evaluation which showed no evidence of deep vein reflux in the right or left lower extremity, and no evidence of great saphenous vein reflux more than 500 ms in the right or left lower extremity, and the left small saphenous vein is incompetent but no vascular consult was recommended. review of her electronic medical records noted that the ABI was checked in July 2017 where the right ABI was normal limits and the left ABI  could not be ascertained due to pain with cuff pressure but the waveforms are within normal limits. her arterial duplex study scheduled for April 27. 10/08/2016 -- the patient has various reasons for not having a compression on and for the last 3 days she has had no compression on her left lower extremity either due to pain or the lack of nursing help. She does not use her juxta lites either. 10/15/2016 -- the patient did not keep her appointment for arterial duplex study on April 27 and I have asked her to reschedule this. Her pain is out of proportion with the physical findings and she continuously fails to wear a compression wraps and cuts them off  because she says she cannot tolerate the pain. She does not use her juxta lites either. 10/22/2016 -- he has rescheduled her arterial duplex study to May 21 and her pain today is a bit better. She has not been wearing her juxta lites on her right lower extremity but now understands that she needs to do this. She did tolerate the to press compression wrap on her left lower extremity 10/29/2016 --arterial duplex study is scheduled for next week and overall she has been tolerating her compression wraps and also using her juxta lites on her right lower extremity 11/05/2016 -- the right ABI was 0.95 the left was 1.03. The digit TBI is on the right was 0.83 on the left was 0.92 and she had biphasic flow through these vessels. The impression was that of normal lower extremity arterial study. 11/12/2016 -- her pain is minimal and she is doing very well overall. 11/26/2016 -- she has got juxta lites and her insurance will not pay for additional dual layer compression stockings. She is going to order some from [place]. 05/12/2017 -- her juxta lites are very old and too big for her and these have not been helping with compression. She did get 20-30 mm compression stockings from [place] but she and her husband are unable to put these on. I believe she will benefit from bilateral Extremit-ease, compression stockings and we will measure her for these today. 05/20/2017 -- lymphedema on the left lower extremity has increased a lot and she has a open ulceration as a result of this. The right lower extremity is looking pretty good. She has decided to by the compression stockings herself and will get reimbursed by the home health, at a later date. 05/27/2017 -- her sciatica is bothering her a lot and she thought her left leg pain was caused due to the compression wrap and hence removed it and has significant lymphedema. There is no inflammation on this left lower extremity. 06/17/17 on evaluation today patient  appears to be doing very well and in fact is completely healed in regard to her ulcerations. Unfortunately however she does have continued issues with lymphedema nonetheless. We did order compression garments for her unfortunately she states that the size that she received were large although we ordered medium. Obviously this means she is not getting the optimal compression. She does not have those with her today and therefore we could not confirm and contact the company on her behalf. Nonetheless she does state that she is going to have her husband bring them by tomorrow so that we can verify and then get in touch with the company. No fevers, chills, nausea, or vomiting noted at this time. Overall patient is doing better otherwise and I'm pleased with the progress she has made. 07/01/17 on evaluation today patient appears  to be doing very well in regard to her bilateral lower extremity she does not have any openings at this point which is excellent news. Overall I'm pleased with how things have progressed up to this time. Since she is doing so well we did order her compression which we are seeing her today to ensure that it fits her properly and everything is doing well in that regard and then subsequently she will be discharged. ============ Old Notes: 03/31/16 patient presents today for evaluation concerning open wounds that she has over the left medial ankle region as well as the left dorsal foot. She has previously had this occur although it has been healed for a number of months after having this for about a year prior until her hospitalization on 01/05/16. At that point in time it appears that she was admitted to the hospital for left lower extremity cellulitis and was placed on vancomycin and Zosyn at that point. Eventually upon discharge on January 15, 2016 she was placed on doxycycline at that point in time. Later on 03/27/16 positive wound culture growing Escherichia coli this was switched  to amoxicillin. Currently she tells me that she is having pain radiated to be a 7 out of 10 which can be as high as 10 out of 10 with palpation and manipulation of the wound. This wound appears to be mainly venous in nature due to the bilateral lower extremity venous stasis/lymphedema. This is definitely much worse on her left than the right side. She does have type 1 diabetes mellitus, hypertension, morbid obesity, and is wheelchair dependent.during the course of the hospital stay a blood culture was also obtained and fortunately appeared negative. She also had an x-ray of the tibia/fibula on the left which showed no acute bone abnormality. Her white blood cell count which was performed last on 03/25/16 was 7.3, hemoglobin 12.8, protein 7.1, albumin 3.0. Her urine culture appeared to be negative for any specific organisms. Patient did have a left lower extremity venous duplex evaluation for DVT. This did not include venous reflux studies but fortunately was negative for DVT. Patient also had arterial studies performed which revealed that she had a normal ABI on the right though this was unable to be performed on the left secondary to pain that she was having around the ankle region due to the wound. However it was stated on report that she had biphasic pulses and apparently good blood flow. ========== 06/03/17 she is here in follow-up evaluation for right lower extremity ulcer. The right lower sure he has healed but she has reopened to the left medial malleolus and dorsal foot with weeping. She is waiting for new compression garments to arrive from home health, the previous compression garments were ill fitting. We will continue with compression bilaterally and follow-up in 2 weeks Readmission: 08/05/17 on evaluation today patient appears to be doing somewhat poorly in regard to her left lower extremity especially although the right lower extremity has a small area which may no longer be open.  She has been having a lot of drainage from the left lower extremity however he tells me that she has not been able to use the EXTREMIT- EASE Compression at this point. She states that she did better and was able to actually apply the Juxta-Lite compression although the wound that she has is too large and therefore really does not compress which is why she cannot wear it at this point. She has no one who can help her put it on  regular basis her son can sometimes but he's not able to do it most of the time. I do believe that's why she has begun to weave and have issues as she is currently yet again. No fevers, chills, nausea, or vomiting noted at this time. Patient is no evidence of dementia. 08/12/17 on evaluation today patient appears to still be doing fairly well in regard to the draining areas/weeping areas at this point. With that being said she unfortunately did go to the ER yesterday due to what was felt to be possibly a cellulitis. They place her on doxycycline by mouth and discharge her home. She definitely was not admitted. With that being said she states she has had more discomfort which has been unusual for her even compared to prior times and she's had infections.08/12/17 on evaluation today patient appears to still be doing fairly well in regard to the draining areas/weeping areas at this point. With that being said she unfortunately did go to the ER yesterday due to what was felt to be possibly a cellulitis. They place her on doxycycline by mouth and discharge her home. She definitely was not admitted. With that being said she states she has had more discomfort which has been unusual for her even compared to prior times and she's had infections. 08/19/17 put evaluation today patient tells me that she's been having a lot of what sounds to be neuropathic type pain in regard to her left lower extremity. She has been using over-the-counter topical bins again which some believe. That in order  to apply the she actually remove the wrap we put on her last Wednesday on Thursday. Subsequently she has not had anything on compression wise since that time. The good news is a lot of the weeping areas appear to have closed at this point again I believe she would do better with compression but we are struggling to get her to actually use what she needs to at this point. No fevers, chills, nausea, or vomiting noted at this time. 09/03/17 on evaluation today patient appears to be doing okay in regard to her lower extremities in regard to the lymphedema and weeping. Fortunately she does not seem to show any signs of infection at this point she does have a little bit of weeping occurring in the right medial malleolus area. With that being said this does not appear to be too significant which is good news. 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 She has severe skin damage secondary to both of these features involving the dorsal left foot and medial left ankle and lower leg. Still has open areas in the left anterior foot. The area on the right closed over. She uses her own juxta light stockings. She does not have an arterial issue 09/16/17 on evaluation today patient actually appears to be doing excellent in regard to her bilateral lower extremity swelling. The Juxta-Lite compression wrap seem to be doing very well for her. She has not however been using the portion that goes over her foot. Her left foot still is draining a little bit not nearly as significant as it has been in the past but still I do believe that she likely needs to utilize the full wrap including the foot portion of this will improve as well. She also has been apparently putting on a significant amount of Vaseline which also think is not helpful for her. I recommended that if she feels she needs something  for moisturizer Eucerin will probably be better. 09/30/17 on  evaluation today patient presents with several new open areas in regard to her left lower extremity although these appear to be minimal and mainly seem to be more moisture breakdown than anything. Fortunately she does not seem to have any evidence of infection which is great news. She has been tolerating the dressing changes without complication we are using silver alginate on the foot she has been using AB pads to have the legs and using her Juxta-Lite compression which seems to be controlling her swelling very well. Overall I'm pleased with the poor way she has progressed. 10/14/17 on evaluation today patient appears to be doing better in regard to her left lower extremity areas of weeping. She does still have some discomfort although in general this does not appear to be as macerated and I think it is progressing nicely. I do think she still needs to wear the foot portion of her Juxta-Lite in order to get the most benefit from the wrap obviously. She states she understands. Fortunately there does not appear to be evidence of infection at this time which is great news. 10/28/17 on evaluation today patient appears to be doing excellent in regard to her left lower extremity. She has just a couple areas that are still open and seem to be causing any trouble whatsoever. For that reason I think that she is definitely headed in the right direction the spots are very tiny compared to what we have been dealing with in the past. 11/11/17 on evaluation today patient appears to have a right lateral lower extremity ulcer that has opened since I last saw her. She states this is where the home health nurse that was coming out remove the dressing without wetting the alginate first. Nonetheless I do not know if this is indeed the case or not but more importantly we have not ordered home help to be coming out for her wounds at all. I'm unsure as to why they are coming out and we're gonna have to check on this and  get things situated in that regard. With that being said we currently really do not need them to be coming out as the patient has been taking care of her leg herself without complication and no issues. In fact she was doing much better prior to nursing coming out. 11/25/17 on evaluation today patient actually appears to be doing fairly well in regard to her left lower extremity swelling. In fact she has very little area of weeping at this point there's just a small spot on the lateral portion of her right leg that still has me just a little bit more concerned as far as wanting to see this clear up before I discharge her to caring for this at home. Nonetheless overall she has made excellent progress. 12/09/17 on evaluation today patient appears to be doing rather well in regard to her lower extremity edema. She does have some weeping still in the left lower extremity although the big area we were taking care of two weeks ago actually has closed and she has another area of weeping on the left lower extremity immediately as well is the top of her foot. She does not currently have lymphedema pumps she has been wearing her compression daily on a regular basis as directed. With that being said I think she may benefit from lymphedema pumps. She has been wearing the compression on a regular basis since I've been seeing her back in  February 2019 through now and despite this she still continues to have issues with stage III lymphedema. We had a very difficult time getting and keeping this under control. 12/23/17 on evaluation today patient actually appears to be doing a little bit more poorly in regard to her bilateral lower extremities. She has been tolerating the Juxta-Lite compression wraps. Unfortunately she has two new ulcers on the right lower extremity and left lower Trinity ulceration seems to be larger. Obviously this is not good news. She has been tolerating the dressings without  complication. 12/30/17 on evaluation today patient actually appears to be doing much better in regard to her bilateral lower extremity edema. She continues to have some issues with ulcerations and in fact there appears to be one spot on each leg where the wrap may have caused a little bit of a blister which is subsequently opened up at this point is given her pain. Fortunately it does not appear to be any evidence of infection which is good news. No fevers chills noted. 01/13/18 on evaluation today patient appears to be doing rather well in regard to her bilateral lower extremities. The dressings did get kind of stuck as far as the wound beds are concerned but again I think this is mainly due to the fact that she actually seems to be showing signs of healing which is good news. She's not having as much drainage therefore she was having more of the dressing sticking. Nonetheless overall I feel like her swelling is dramatically down compared to previous. 01/20/18 on evaluation today patient unfortunately though she's doing better in most regards has a large blister on the left anterior lower extremity where she is draining quite significantly. Subsequently this is going to need debridement today in order to see what's underneath and ensure she does not continue to trapping fluid at this location. Nonetheless No fevers, chills, nausea, or vomiting noted at this time. 01/27/18 on evaluation today patient appears to be doing rather well at this point in regard to her right lower extremity there's just a very small area that she still has open at this point. With that being said I do believe that she is tolerating the compression wraps very well in making good progress. Home health is coming out at this point to see her. Her left lower extremity on the lateral portion is actually what still mainly open and causing her some discomfort for the most part 02/10/18 on evaluation today patient actually appears to  be doing very well in regard to her right lower extremity were all the ulcers appear to be completely close. In regard to the left lower extremity she does have two areas still open and some leaking from the dorsal surface of her foot but this still seems to be doing much better to me in general. 02/24/18 on evaluation today patient actually appears to be doing much better in regard to her right lower extremity this is still completely healed. Her left lower extremity is also doing much better fortunately she has no evidence of infection. The one area that is gonna require some debridement is still on the left anterior shin. Fortunately this is not hurting her as badly today. 03/10/18 on evaluation today patient appears to be doing better in some regards although she has a little bit more open area on the dorsal foot and she also has some issues on the medial portion of the left lower extremity which is actually new and somewhat deep. With that being  said there fortunately does not appear to be any significant signs of infection which is good news. No fevers, chills, nausea, or vomiting noted at this time. In general her swelling seems to be doing fairly well which is good news. 03/31/18 on evaluation today patient presents for follow-up concerning her left lower extremity lymphedema. Unfortunately she has been doing a little bit more poorly since I last saw her in regard to the amount of weeping that she is experiencing. She's also having some increased pain in the anterior shin location. Unfortunately I do not feel like the patient is making such good progress at this point a few weeks back she was definitely doing much better. 04/07/18 on evaluation today patient actually appears to be showing some signs of improvement as far as the left lower extremity is concerned. She has been tolerating the dressing changes and it does appear that the Drawtex did better for her. With that being said  unfortunately home health is stating that they cannot obtain the Drawtex going forward. Nonetheless we're gonna have to check and see what they may be able to get the alginate they were using was getting stuck in causing new areas of skin being pulled all that with and subsequently weep and calls her to worsen overall this is the first time we've seen improvement at this time. 04/14/18 on evaluation today patient actually appears to be doing rather well at this point there does not appear to be any evidence of infection at this time and she is actually doing excellent in regard to the weeping in fact she almost has no openings remaining even compared to just last week this is a dramatic improvement. No fevers chills noted 04/21/18 evaluation today patient actually appears to be doing very well. She in fact is has a small area on the posterior lower extremity location and she has a small area on the dorsal surface of her foot that are still open both of which are very close to closing. We're hoping this will be close shortly. She brought her Juxta-Lite wrap with her today hoping that would be able to put her in it unfortunately I don't think were quite at that point yet but we're getting closer. 04/28/18 upon evaluation today patient actually appears to be doing excellent in regard to her left lower extremity ulcer. In fact the region on the posterior lower extremity actually is much smaller than previously noted. Overall I'm very happy with the progress she has made. She again did bring her Juxta-Lite although we're not quite ready for that yet. 05/11/18 upon evaluation today patient actually appears to be doing in general fairly well in regard to her left lower Trinity. The swelling is very well controlled. With that being said she has a new area on the left anterior lower extremity as well as between the first and second toes of her left foot that was not present during the last evaluation. The  region of her posterior left lower extremity actually appears to be almost completely healed. To be honest I'm very pleased with the way that stands. Nonetheless I do believe that the lotion may be keeping the area to moist as far as her legs are concerned subsequently I'm gonna consider discontinuing that today. 05/26/18 on evaluation today patient appears to be doing rather well in regard to her left lower should be ulcers. In fact everything appears to be close except for a very small area on the left posterior lower extremity. Fortunately there  does not appear to be any evidence of infection at this time. Overall very pleased with her progress. 06/02/18 and evaluation today patient actually appears to be doing very well in regard to her lower extremity ulcers. She has one small area that still continues to weep that I think may benefit her being able to justify lotion and user Juxta-Lite wraps versus continued to wrap her. Nonetheless I think this is something we can definitely look into at this point. 06/23/18 on evaluation today patient unfortunately has openings of her bilateral lower extremities. In general she seems to be doing much worse than when I last saw her just as far as her overall health standpoint is concerned. She states that her discomfort is mainly due to neuropathy she's not having any other issues otherwise. No fevers, chills, nausea, or vomiting noted at this time. 06/30/18 on evaluation today patient actually appears to be doing a little worse in regard to her right lower extremity her left lower extremity of doing fairly well. Fortunately there is no sign of infection at this time. She has been tolerating the dressing changes without complication. Home health did not come out like they were supposed to for the appropriate wrap changes. They stated that they never received the orders from [place] which were fax. Nonetheless we will send a copy of the orders with the patient today  as well. 07/07/18 on evaluation today patient appears to be doing much better in regard to lower extremities. She still has several openings bilaterally although since I last saw her her legs did show obvious signs of infection when she later saw her nurse. Subsequently a culture was obtained and she is been placed on Bactrim and Keflex. Fortunately things seem to be looking much better it does appear she likely had an infection. Again last week we'd even discussed it but again there really was not any obvious sign that she had infection therefore we held off on the antibiotics. Nonetheless I'm glad she's doing better today. 07/14/18 on evaluation today patient appears to be doing much better regarding her bilateral lower Trinity's. In fact on the right lower for me there's nothing open at this point there are some dry skin areas at the sites where she had infection. Fortunately there is no evidence of systemic infection which is excellent news. No fevers chills noted 07/21/18 on evaluation today patient actually appears to be doing much better in regard to her left lower extremity ulcers. She is making good progress and overall I feel like she's improving each time I see her. She's having no pain I do feel like the infection is completely resolved which is excellent news. No fevers, chills, nausea, or vomiting noted at this time. 07/28/18 on evaluation today patient appears to be doing very well in regard to her left lower [employer]. Everything seems to be showing signs of improvement which is excellent news. Overall very pleased with the progress that has been made. Fortunately there's no evidence of active infection at this time also excellent news. 08/04/18 on evaluation today patient appears to be doing more poorly in regard to her bilateral lower extremities. She has two new areas open up on the right and these were completely closed as of last week. She still has the two spots on the left  which in my pinion seem to be doing better. Fortunately there's no evidence of infection again at this point. 08/11/18 on evaluation today patient actually appears to be doing very well in  regard to her bilateral lower Trinity wounds that all seem to be doing better and are measures smaller today. Fortunately there's no signs of infection. No fevers, chills, nausea, or vomiting noted at this time. 08/18/18 on evaluation today patient actually appears to be doing about the same inverter bilateral lower extremities. She continues to have areas that blistering open as was drain that fortunately nothing too significant. Overall I feel like Drawtex may have done better for her however compared to the collagen. 08/25/18 on evaluation today patient appears to be doing a little bit more poorly today even compared to last time I saw her. Again I'm not exactly sure why she's making worse progress over the past several weeks. I'm beginning to wonder if there is some kind of underlying low level infection causing this issue. I did actually take a culture from the left anterior lower extremity but it was a new wound draining quite a bit at this point. Unfortunately she also seems to be having more pain which is what also makes me worried about the possibility of infection. This is despite never erythema noted at this point. 09/01/18 on evaluation today patient actually appears to be doing a little worse even compared to last week in regard to bilateral lower extremities. She did go to the hospital on the 16th was given a dose of IV Zosyn and then discharged with a recommendation to continue with the Bactrim that I previously prescribed for her. Nonetheless she is still having a lot of discomfort she tells me as well at this time. This is definitely unfortunate. No fevers, chills, nausea, or vomiting noted at this time. 09/08/18 on evaluation today patient's bilateral lower extremities actually appear to be shown  signs of improvement which is good news. Fortunately there does not appear to be any signs of active infection I think the anabiotic is helping in this regard. Overall I'm very pleased with how she is progressing. 09/15/18 patient was actually seen in ER yesterday due to her legs as well unfortunately. She states she's been having a lot of pain and discomfort as well as a lot of drainage. Upon inspection today the patient does have a lot of swelling and drainage I feel like this is more related to lymphedema and poor fluid control than it is to infection based on what I'm seeing. The physician in the emergency department also doubted that the patient was having a significant infection nonetheless I see no evidence of infection obvious at this point although I do see evidence of poor fluid control. She still not using a compression pumps, she is not elevating due to her lift chair as well as her hospital bed being broken, and she really is not keeping her legs up as much as they should be and also has been taking off her wraps. All this combined I think has led to poor fluid control and to be honest she may be somewhat volume overloaded in general as well. I recommend that she may need to contact your physician to see if a prescription for a diuretic would be beneficial in their opinion. As long as this is safe I think it would likely help her. 09/29/18 on evaluation today patient's left lower extremity actually appears to be doing quite a bit better. At least compared to last time that I saw her. She still has a large area where she is draining from but there's a lot of new skin speckled trout and in fact there's more new  skin that there are open areas of weeping and drainage at this point. This is good news. With regard to the right lower extremity this is doing much better with the only open area that I really see being a dry spot on the right lateral ankle currently. Fortunately there's no signs of  active infection at this time which is good news. No fevers, chills, nausea, or vomiting noted at this time. The patient seems somewhat stressed and overwhelmed during the visit today she was very lethargic as such. She does and she is not taking any pain medications at this point. Apparently according to her husband are also in the process of moving which is probably taking its toll on her as well. 10/06/18 on evaluation today patient appears to be doing rather well in regard to her lower extremities compared to last evaluation. Fortunately there's no signs of active infection. She tells me she did have an appointment with her primary. Nonetheless he was concerned that the wounds were somewhat deep based on pictures but we never actually saw her legs. She states that he had her somewhat worried due to the fact that she was fearing now that she was [place] have to have an amputation. With that being said based on what I'm seeing check she looks better this week that she has the last two times I've seen her with much less drainage I'm actually pleased in this regard. That doesn't mean that she's out of the water but again I do not think what the point of talking about education at all in regard to her leg. She is very happy to hear this. She is also not having as much pain as she was having last week. 10/13/18 unfortunately on evaluation today patient still continues to have a significant amount of drainage she's not letting home health actually apply the compression dressings at this point. She's trying to use of Juxta-Lite of the top of Kerlex and the second layer of the three layer compression wrap. With that being said she just does not seem to be making as good a progress as I would expect if she was having the compression applied and in place on a regular basis. No fevers, chills, nausea, or vomiting noted at this time. 10/20/18 on evaluation today patient appears to be doing a little better in  regard to her bilateral lower extremity ulcers. In fact the right lower extremity seems to be healed she doesn't even have any openings at this point left lower extremity though still somewhat macerated seems to be showing signs of new skin growth at multiple locations throughout. Fortunately there's no evidence of active infection at this time. No fevers, chills, nausea, or vomiting noted at this time. 10/27/18 on evaluation today patient appears to be doing much better in regard to her left lower Trinity ulcer. She's been tolerating the laptop complication and has minimal drainage noted at this point. Fortunately there's no signs of active infection at this time. No fevers, chills, nausea, or vomiting noted at this time. 11/03/18 on evaluation today patient actually appears to be doing excellent in regard to her left lower extremity. She is having very little drainage at this point there does not appear to be any significant signs of infection overall very pleased with how things have gone. She is likewise extremely pleased still and seems to be making wonderful progress week to week. I do believe antibiotics were helpful for her. Her primary care provider did place on amateur clean  since I last saw her. 11/17/18 on evaluation today patient appears to be doing worse in regard to her bilateral lower extremities at this point. She is been tolerating the dressing changes without complication. With that being said she typically takes the Coban off fairly quickly upon arriving home even after being seen here in the clinic and does not allow home health reapply command as part of the dressing at home. Therefore she said no compression essentially since I last saw her as best I can tell. With that being said I think it shows and how much swelling she has in the open wounds that are noted at this point. Fortunately there's no signs of infection but unfortunately if she doesn't get this under control I think  she will end up with infection and more significant issues. 11/24/18 on evaluation today patient actually appears to be doing somewhat better in regard to her bilateral lower extremities. 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 Nonetheless that is doing better currently so she should be able to attempt reinitiating the lymphedema pumps at this time. No fevers, chills, nausea, or vomiting noted at this time. 12/01/18 upon evaluation today patient's left lower extremity appears to be doing quite well unfortunately her right lower extremity is not doing nearly as well. She has been tolerating the dressing changes without complication unfortunately she did not keep a wrap on the right at this time. Nonetheless I believe this has led to increased swelling and weeping in the world is actually much larger than during the last evaluation with her. 12/08/18 on evaluation today patient appears to be doing about the same at this point in regard to her right lower extremity. There is some more palatable to touch I'm concerned about the possibility of there being some infection although I think the main issue is she's not keeping her compression wrap on which in turn is not allowing this area to heal appropriately. 12/22/18 on evaluation today patient appears to be doing better in regard to left lower extremity unfortunately significantly worse in regard to the right lower extremity. The areas of blistering and necrotic superficial tissue have spread and again this does not really appear to be signs of infection and all she just doesn't seem to be doing nearly as well is what she has been in the past. Overall I feel like the Augmentin did absolutely nothing for her she doesn't seem to have any infection again I really didn't think so last time either is more of a  potential preventative measure and hoping that this would make some difference but I think the main issue is she's not wearing her compression. She tells me she cannot wear the [place] been we put on she takes it off pretty much upon getting home. Subsequently she worshiped Juxta-Lite when I questioned her about how often she wears it this is no more than three hours a day obviously that leaves 21 hours that she has no compression and this is obviously not doing well for her. Overall I'm concerned that if things continue to worsen she is at great risk of both infection as well as losing her leg. 01/05/19 on evaluation today patient appears to be doing well in regard to her left lower extremity which he is allowing [place] to wrap and not so  well with regard to her right lower extremity which she is not allowing [place] to really wrap and keep the wrap on. She states that it hurts too badly whenever it's wrapped and she ends up having to take it off. She's been using the Juxta-Lite she tells me up to six hours a day although I question whether or not that's really been the case to be honest. Previously she told me three hours today nonetheless obviously the legs as long as the wrap is doing great when she is not is doing much more poorly. 01/12/2019 on evaluation today patient actually appears to be doing a little better in my opinion with regard to her right lower extremity ulcer. She has a small open area on the left lower extremity unfortunately but again this I think is part of the normal fluctuation of what she is going to have to expect with regard to her legs especially when she is not using her lymphedema pumps on a regular basis. Subsequently based on what I am seeing today I think that she does seem to be doing slightly better with regard to her right lower extremity she did see her primary care provider on Monday they felt she had an infection and placed her on 2 antibiotics. Both Cipro and  clindamycin. Subsequently again she seems possibly to be doing a little bit better in regards to the right lower extremity she also tells me however she has been wearing the compression wrap over the past week since I spoke with her as well that is a Kerlix and Coban wrap on the right. No fevers, chills, nausea, vomiting, or diarrhea. 01/19/2019 on evaluation today patient appears to be doing better with regard to her bilateral lower extremities especially the right. I feel like the compression has been beneficial for her which is great news. She did get a call from her primary care provider on her way here today telling her that she did have methicillin-resistant Staphylococcus aureus and he was calling in a couple new antibiotics for her including a ointment to be applied she tells me 3 times a day. With that being said this sounds like likely to be Bactroban which I think could be applied with each dressing/wrap change but I would not be able to accommodate her applying this 3 times a day. She is in agreement with the least doing this we will add that to her orders today. 01/26/2019 on evaluation today patient actually appears to be doing much better with regard to her right lower extremity. Her left lower extremity is also doing quite well all things considering. Fortunately there is no evidence of active infection at this time. No fevers, chills, nausea, vomiting, or diarrhea. 02/02/2019 on evaluation today patient appears to be doing much better compared to her last evaluation. Little by little off like her right leg is returning more towards normal. There does not appear to be any signs of active infection and overall she seems to be doing quite well which is great news. I am very pleased in this regard. No fevers, chills, nausea, vomiting, or diarrhea. 02/09/2019 upon evaluation today patient appears to be doing better with regard to her bilateral lower extremities. She has been tolerating the  dressing changes without complication. Fortunately there is no signs of active infection at this time. No fevers, chills, nausea, vomiting, or diarrhea. 02/23/2019 on evaluation today patient actually appears to be doing quite well with regard to her bilateral lower extremities. She has been  tolerating the dressing changes without complication. She is even used her pumps one time and states that she really felt like it felt good. With that being said she seems to be in good spirits and her legs appear to be doing excellent. 03/09/2019 on evaluation today patient appears to be doing well with regard to her right lower extremity there are no open wounds at this time she is having some discomfort but I feel like this is more neuropathy than anything. With regard to her left lower extremity she had several areas scattered around that she does have some weeping and drainage from but again overall she does not appear to be having any significant issues and no evidence of infection at this time which is good news. 03/23/2019 on evaluation today patient appears to be doing well with regard to her right lower extremity which she tells me is still close she is using her juxta light here. Her left lower extremity she mainly just has an area on the foot which is still slightly draining although this also is doing great. Overall very pleased at this time. 04/06/2019 patient appears to be doing a little bit worse in regard to her left lower extremity upon evaluation today. She feels like this could be becoming infected again which she had issues with previous. Fortunately there is no signs of systemic infection but again this is always a struggle with her with her legs she will go from doing well to not so well in a very short amount of time. Patient History Information obtained from Patient. Family History Cancer - Siblings, Hypertension - Siblings, Stroke - Father, No family history of Diabetes, Heart Disease,  Hereditary Spherocytosis, Kidney Disease, Lung Disease, Seizures, Thyroid Problems, Tuberculosis. Social History Never smoker, Marital Status - Married, Alcohol Use - Never, Drug Use - No History, Caffeine Use - Never. Medical History Eyes Denies history of Cataracts, Glaucoma, Optic Neuritis Ear/Nose/Mouth/Throat Denies history of Chronic sinus problems/congestion, Middle ear problems Hematologic/Lymphatic Denies history of Anemia, Hemophilia, Human Immunodeficiency Virus, Lymphedema, Sickle Cell Disease Respiratory Patient has history of Asthma Denies history of Aspiration, Chronic Obstructive Pulmonary Disease (COPD), Pneumothorax, Sleep Apnea, Tuberculosis Cardiovascular Patient has history of Hypertension, Peripheral Arterial Disease, Peripheral Venous Disease - chronic Denies history of Angina, Arrhythmia, Congestive Heart Failure, Coronary Artery Disease, Deep Vein Thrombosis, Hypotension, Myocardial Infarction, Phlebitis, Vasculitis Endocrine Patient has history of Type II Diabetes Genitourinary Denies history of End Stage Renal Disease Immunological Denies history of Lupus Erythematosus, Raynaudoos, Scleroderma Musculoskeletal Patient has history of Gout, Osteoarthritis Denies history of Rheumatoid Arthritis, Osteomyelitis Neurologic Denies history of Dementia, Neuropathy, Quadriplegia, Paraplegia, Seizure Disorder Psychiatric Denies history of Anorexia/bulimia, Confinement Anxiety Hospitalization/Surgery History - leg cellulitis. Medical And Surgical History Notes Constitutional Symptoms (General Health) morbid obesity, wheelchair dependent Integumentary (Skin) LLL cellulitis , wound cx grew E. Coli Psychiatric insomnia , adjustment disorder, depression with anxiety Review of Systems (ROS) Constitutional Symptoms (General Health) Denies complaints or symptoms of Fatigue, Fever, Chills, Marked Weight Change. Respiratory Denies complaints or symptoms of Chronic or  frequent coughs, Shortness of Breath. Cardiovascular Denies complaints or symptoms of Chest pain. Psychiatric Denies complaints or symptoms of Claustrophobia, Suicidal. Objective Constitutional Obese and well-hydrated in no acute distress. Vitals Time Taken: 9:43 AM, Height: 62 in, Weight: 335 lbs, BMI: 61.3, Temperature: 98.4 F, Pulse: 92 bpm, Respiratory Rate: 20 breaths/min, Blood Pressure: 158/86 mmHg. Respiratory normal breathing without difficulty. clear to auscultation bilaterally. Cardiovascular regular rate and rhythm with normal S1, S2. Psychiatric this patient  is able to make decisions and demonstrates good insight into disease process. Alert and Oriented x 3. pleasant and cooperative. General Notes: Upon inspection today patient's wound bed actually showed signs of good granulation at this point in some areas she did have other areas where the skin was lifting up and she had a small sore under several locations. Unfortunately this is causing her discomfort as well and I am concerned about the possibility of infection I do believe that we will need to see about addressing this more effectively. She did well with clindamycin the last time her primary care provider gave her a prescription for this. That was coupled with the topical antibiotic ointment. Integumentary (Hair, Skin) Wound #57 status is Open. Original cause of wound was Gradually Appeared. The wound is located on the Left,Dorsal Foot. The wound measures 3cm length x 3.2cm width x 0.1cm depth; 7.54cm^2 area and 0.754cm^3 volume. There is Fat Layer (Subcutaneous Tissue) Exposed exposed. There is no tunneling or undermining noted. There is a small amount of serosanguineous drainage noted. There is medium (34-66%) pink, pale granulation within the wound bed. There is a medium (34-66%) amount of necrotic tissue within the wound bed including Adherent Slough. Wound #58 status is Open. Original cause of wound was Blister.  The wound is located on the Left,Medial Ankle. The wound measures 3.6cm length x 2cm width x 0.1cm depth; 5.655cm^2 area and 0.565cm^3 volume. There is Fat Layer (Subcutaneous Tissue) Exposed exposed. There is no tunneling or undermining noted. There is a small amount of serosanguineous drainage noted. There is medium (34-66%) pink granulation within the wound bed. There is a medium (34-66%) amount of necrotic tissue within the wound bed including Adherent Slough. Assessment Active Problems ICD-10 Type 2 diabetes mellitus with other skin ulcer Lymphedema, not elsewhere classified Chronic venous hypertension (idiopathic) with ulcer and inflammation of right lower extremity Chronic venous hypertension (idiopathic) with ulcer and inflammation of left lower extremity 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 leg with fat layer exposed Non-pressure chronic ulcer of other part of left lower leg with fat layer exposed Essential (primary) hypertension Morbid (severe) obesity due to excess calories Other specified anxiety disorders Weakness Procedures Wound #57 Pre-procedure diagnosis of Wound #57 is a Diabetic Wound/Ulcer of the Lower Extremity located on the Left,Dorsal Foot . There was a Three Layer Compression Therapy Procedure by Zandra Abts, RN. Post procedure Diagnosis Wound #57: Same as Pre-Procedure Wound #58 Pre-procedure diagnosis of Wound #58 is a Diabetic Wound/Ulcer of the Lower Extremity located on the Left,Medial Ankle . There was a Three Layer Compression Therapy Procedure by Zandra Abts, RN. Post procedure Diagnosis Wound #58: Same as Pre-Procedure Plan Follow-up Appointments: Return Appointment in 2 weeks. Dressing Change Frequency: Wound #57 Left,Dorsal Foot: Change dressing three times week. Skin Barriers/Peri-Wound Care: Moisturizing lotion - to right leg at nightly after removing juxtalite Wound Cleansing: Wound #57 Left,Dorsal Foot: May shower  with protection. Primary Wound Dressing: Wound #57 Left,Dorsal Foot: Calcium Alginate with Silver Other: - Gentamicin ointment to wound bed under alginate (use Mupirocin in clinic) Wound #58 Left,Medial Ankle: Calcium Alginate with Silver Other: - Gentamicin ointment to wound bed under alginate (use Mupirocin in clinic) Secondary Dressing: Wound #57 Left,Dorsal Foot: Dry Gauze Wound #58 Left,Medial Ankle: Dry Gauze Edema Control: 3 Layer Compression System - Left Lower Extremity Avoid standing for long periods of time - walking is encouraged Elevate legs to the level of the heart or above for 30 minutes daily and/or when  sitting, a frequency of: - do not sleep in chair with feet dangling Exercise regularly Support Garment 20-30 mm/Hg pressure to: - Juxtalite to right lower leg daily Segmental Compressive Device. - lymphedema pumps 60 minutes 1- 2 times per day Additional Orders / Instructions: Follow Nutritious Diet - To include vitamin A, vitamin C, and Zinc along with increased protein intake. Home Health: Continue Home Health skilled nursing for wound care. - [facility] The following medication(s) was prescribed: clindamycin HCl oral 300 mg capsule 1 1 capsule oral taken 3 times a day for 15 days starting 04/06/2019 gentamicin topical 0.1 % cream cream topical applied as directed to the open wound locations with each wrap change starting 04/06/2019 1. I would recommend currently that we go ahead and initiate treatment with the clindamycin along with the gentamicin cream which hopefully will do well with regard to treating her infection. 2. I am and recommend as well that we continue with the silver alginate dressing and subsequently the 3 layer compression wrap which seems to have done well up to this point. 3. I recommend the patient elevate her legs and I really think she needs to be using her lymphedema pumps twice a day least once a day but again I am not sure she is using  this at all she tells me that it hurts. We will see patient back for reevaluation in 2 weeks here in the clinic. If anything worsens or changes patient will contact our office for additional recommendations. Electronic Signature(s) Signed: 04/06/2019 6:13:43 PM By: Lenda Kelp PA-C Entered By: Lenda Kelp on 04/06/2019 11:02:34 -------------------------------------------------------------------------------- HxROS Details Patient Name: Date of Service: [name]. 04/06/2019 10:00 AM Medical Record [id number] Patient Account Number: [id number] Date of Birth/Sex: Treating RN: 09-15-1950 (67 y.o. Wynelle Link Primary Care Provider: Fatima Sanger Other Clinician: Referring Provider: Treating Provider/Extender:Stone III, Flo Shanks, FRED Weeks in Treatment: 51 Information Obtained From Patient Constitutional Symptoms (General Health) Complaints and Symptoms: Negative for: Fatigue; Fever; Chills; Marked Weight Change Medical History: Past Medical History Notes: morbid obesity, wheelchair dependent Respiratory Complaints and Symptoms: Negative for: Chronic or frequent coughs; Shortness of Breath Medical History: Positive for: Asthma Negative for: Aspiration; Chronic Obstructive Pulmonary Disease (COPD); Pneumothorax; Sleep Apnea; Tuberculosis Cardiovascular Complaints and Symptoms: Negative for: Chest pain Medical History: Positive for: Hypertension; Peripheral Arterial Disease; Peripheral Venous Disease - chronic Negative for: Angina; Arrhythmia; Congestive Heart Failure; Coronary Artery Disease; Deep Vein Thrombosis; Hypotension; Myocardial Infarction; Phlebitis; Vasculitis Psychiatric Complaints and Symptoms: Negative for: Claustrophobia; Suicidal Medical History: Negative for: Anorexia/bulimia; Confinement Anxiety Past Medical History Notes: insomnia , adjustment disorder, depression with anxiety Eyes Medical History: Negative for: Cataracts;  Glaucoma; Optic Neuritis Ear/Nose/Mouth/Throat Medical History: Negative for: Chronic sinus problems/congestion; Middle ear problems Hematologic/Lymphatic Medical History: Negative for: Anemia; Hemophilia; Human Immunodeficiency Virus; Lymphedema; Sickle Cell Disease Endocrine Medical History: Positive for: Type II Diabetes Time with diabetes: 2005 Treated with: Insulin Blood sugar tested every day: No Blood sugar testing results: Breakfast: 155; Lunch: 155; Dinner: 160; Bedtime: 155 Genitourinary Medical History: Negative for: End Stage Renal Disease Immunological Medical History: Negative for: Lupus Erythematosus; Raynauds; Scleroderma Integumentary (Skin) Medical History: Past Medical History Notes: LLL cellulitis , wound cx grew E. Coli Musculoskeletal Medical History: Positive for: Gout; Osteoarthritis Negative for: Rheumatoid Arthritis; Osteomyelitis Neurologic Medical History: Negative for: Dementia; Neuropathy; Quadriplegia; Paraplegia; Seizure Disorder Immunizations Pneumococcal Vaccine: Received Pneumococcal Vaccination: Yes Implantable Devices No devices added Hospitalization / Surgery History Type of Hospitalization/Surgery leg cellulitis Family and Social History  Cancer: Yes - Siblings; Diabetes: No; Heart Disease: No; Hereditary Spherocytosis: No; Hypertension: Yes - Siblings; Kidney Disease: No; Lung Disease: No; Seizures: No; Stroke: Yes - Father; Thyroid Problems: No; Tuberculosis: No; Never smoker; Marital Status - Married; Alcohol Use: Never; Drug Use: No History; Caffeine Use: Never; Financial Concerns: No; Food, Clothing or Shelter Needs: No; Support System Lacking: No; Transportation Concerns: No Physician Affirmation I have reviewed and agree with the above information. Electronic Signature(s) Signed: 04/06/2019 6:13:43 PM By: Lenda Kelp PA-C Signed: 04/06/2019 6:50:57 PM By: Zandra Abts RN, BSN Entered By: Lenda Kelp on  04/06/2019 10:57:58 -------------------------------------------------------------------------------- SuperBill Details Patient Name: Date of Service: [name] 04/06/2019 Medical Record [id number] Patient Account Number: [id number] Date of Birth/Sex: Treating RN: Aug 23, 1950 (67 y.o. Wynelle Link Primary Care Provider: Fatima Sanger Other Clinician: Referring Provider: Treating Provider/Extender:Stone III, Flo Shanks, FRED Weeks in Treatment: 25 Diagnosis Coding ICD-10 Codes Code Description E11.622 Type 2 diabetes mellitus with other skin ulcer I89.0 Lymphedema, not elsewhere classified I87.331 Chronic venous hypertension (idiopathic) with ulcer and inflammation of right lower extremity I87.332 Chronic venous hypertension (idiopathic) with ulcer and inflammation of left lower extremity L97.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 leg with fat layer exposed L97.822 Non-pressure chronic ulcer of other part of left lower leg with fat layer exposed I10 Essential (primary) hypertension E66.01 Morbid (severe) obesity due to excess calories F41.8 Other specified anxiety disorders R53.1 Weakness Facility Procedures CPT4 Code Description: [id number] (Facility Use Only) [phone] - APPLY MULTLAY COMPRS LWR LT LEG Modifier: Quantity: 1 Physician Procedures CPT4 Code Description: [id number] - WC PHYS LEVEL 4 - EST PT ICD-10 Diagnosis Description E11.622 Type 2 diabetes mellitus with other skin ulcer I89.0 Lymphedema, not elsewhere classified I87.331 Chronic venous hypertension (idiopathic) with ulcer and  lower extremity I87.332 Chronic venous hypertension (idiopathic) with ulcer and extremity Modifier: inflammation of inflammation of Quantity: 1 right left lower Electronic Signature(s) Signed: 04/06/2019 6:13:43 PM By: Lenda Kelp PA-C Entered By: Lenda Kelp on 04/06/2019 11:02:49

## 2019-04-20 ENCOUNTER — Other Ambulatory Visit: Payer: Self-pay

## 2019-04-20 ENCOUNTER — Encounter (HOSPITAL_BASED_OUTPATIENT_CLINIC_OR_DEPARTMENT_OTHER): Payer: Medicare PPO | Attending: Physician Assistant | Admitting: Physician Assistant

## 2019-04-20 DIAGNOSIS — I87319 Chronic venous hypertension (idiopathic) with ulcer of unspecified lower extremity: Secondary | ICD-10-CM | POA: Diagnosis not present

## 2019-04-20 DIAGNOSIS — I10 Essential (primary) hypertension: Secondary | ICD-10-CM | POA: Insufficient documentation

## 2019-04-20 DIAGNOSIS — L97822 Non-pressure chronic ulcer of other part of left lower leg with fat layer exposed: Secondary | ICD-10-CM | POA: Diagnosis not present

## 2019-04-20 DIAGNOSIS — M109 Gout, unspecified: Secondary | ICD-10-CM | POA: Diagnosis not present

## 2019-04-20 DIAGNOSIS — Z6841 Body Mass Index (BMI) 40.0 and over, adult: Secondary | ICD-10-CM | POA: Diagnosis not present

## 2019-04-20 DIAGNOSIS — L97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 leg with fat layer exposed: Secondary | ICD-10-CM | POA: Insufficient documentation

## 2019-04-20 DIAGNOSIS — E11622 Type 2 diabetes mellitus with other skin ulcer: Secondary | ICD-10-CM | POA: Insufficient documentation

## 2019-04-20 NOTE — Progress Notes (Signed)
[name] ([id number]) Visit Report for 04/20/2019 Chief Complaint Document Details Patient Name: Date of Service: [name], [name] 04/20/2019 9:30 AM Medical Record [id number] Patient Account Number: [id number] Date of Birth/Sex: Treating RN: [birth year] (68 y.o. [name] Primary Care Provider: Fatima Sanger Other Clinician: Referring Provider: Treating Provider/Extender:Stone III, Flo Shanks, FRED Weeks in Treatment: 73 Information Obtained from: Patient Chief Complaint Bilateral reoccurring LE ulcers Electronic Signature(s) Signed: 04/20/2019 6:48:11 PM By: Lenda Kelp PA-C Entered By: Lenda Kelp on 04/20/2019 10:18:09 -------------------------------------------------------------------------------- HPI Details Patient Name: Date of Service: [name]. 04/20/2019 9:30 AM Medical Record [id number] Patient Account Number: [id number] Date of Birth/Sex: Treating RN: 26-May-1951 (68 y.o. [name] Primary Care Provider: Fatima Sanger Other Clinician: Referring Provider: Treating Provider/Extender:Stone III, Flo Shanks, FRED Weeks in Treatment: 39 History of Present Illness HPI Description: this patient has been seen a couple of times before and returns with recurrent problems to her right and left lower extremity with swelling and weeping ulcerations due to not wearing her compression stockings which she had been advised to do during her last discharge, at the end of June 2018. During her last visit the patient had had normal arterial blood flow and her venous reflux study did not necessitate any surgical intervention. She was recommended compression and elevation and wound care. After prolonged treatment the patient was completely healed but she has been noncompliant with wearing or compressions.. She was here last week with an outpatient return visit planned but the patient came in a very poor general condition with altered mental  status and was rushed to the ER on my request. With a history of hypertension, diabetes, TIA and right-sided weakness she was set up for an MRI on her brain and cervical spine and was sent to [facility]. Getting an MRI done was very difficult but once the workup was done she was found not to have any spinal stenosis, epidural abscess or hematoma or discitis. This was radiculopathy to be treated as an outpatient and she was given a follow-up appointment. Today she is feeling much better alert and oriented and has come to reevaluate her bilateral lower extremity lymphedema and ulceration 03/25/2017 -- she was admitted to the hospital on 03/16/2017 and discharged on 03/18/2017 with left leg cellulitis and ulceration. She was started on vancomycin and Zosyn and x-ray showed no bony involvement. She was treated for a cellulitis with IV antibiotics changed to Rocephin and Flagyl and was discharged on oral Keflex and doxycycline to complete a 7 day course. Last hemoglobin A1c was 70.1 and her other ailments including hypertension got asthma were appropriately treated. 05/06/2017 -- she is awaiting the right size of compression stockings from [place] but other than that has been doing well. ====== Old notes 68 year old patient was seen one time last October and was lost to follow-up. She has recurrent problems with weeping and ulceration of her left lower extremity and has swelling of this for several years. It has been worse for the last 2 months. Past medical history is significant for diabetes mellitus type 2, hypertension, gout, morbid obesity, depressive disorders, hiatal hernia, migraines, status post knee surgery, risk of a cholecystectomy, vaginal hysterectomy and breast biopsy. She is not a smoker. As noted before she has never had a venous duplex study and an arterial ABI study was attempted but the left lower extremity was noncompressible 10/01/2016 -- had a lower extremity venous duplex  reflux evaluation which showed no evidence of deep vein reflux  in the right or left lower extremity, and no evidence of great saphenous vein reflux more than 500 ms in the right or left lower extremity, and the left small saphenous vein is incompetent but no vascular consult was recommended. review of her electronic medical records noted that the ABI was checked in July 2017 where the right ABI was normal limits and the left ABI could not be ascertained due to pain with cuff pressure but the waveforms are within normal limits. her arterial duplex study scheduled for April 27. 10/08/2016 -- the patient has various reasons for not having a compression on and for the last 3 days she has had no compression on her left lower extremity either due to pain or the lack of nursing help. She does not use her juxta lites either. 10/15/2016 -- the patient did not keep her appointment for arterial duplex study on April 27 and I have asked her to reschedule this. Her pain is out of proportion with the physical findings and she continuously fails to wear a compression wraps and cuts them off because she says she cannot tolerate the pain. She does not use her juxta lites either. 10/22/2016 -- he has rescheduled her arterial duplex study to May 21 and her pain today is a bit better. She has not been wearing her juxta lites on her right lower extremity but now understands that she needs to do this. She did tolerate the to press compression wrap on her left lower extremity 10/29/2016 --arterial duplex study is scheduled for next week and overall she has been tolerating her compression wraps and also using her juxta lites on her right lower extremity 11/05/2016 -- the right ABI was 0.95 the left was 1.03. The digit TBI is on the right was 0.83 on the left was 0.92 and she had biphasic flow through these vessels. The impression was that of normal lower extremity arterial study. 11/12/2016 -- her pain is minimal and  she is doing very well overall. 11/26/2016 -- she has got juxta lites and her insurance will not pay for additional dual layer compression stockings. She is going to order some from [place]. 05/12/2017 -- her juxta lites are very old and too big for her and these have not been helping with compression. She did get 20-30 mm compression stockings from [place] but she and her husband are unable to put these on. I believe she will benefit from bilateral Extremit-ease, compression stockings and we will measure her for these today. 05/20/2017 -- lymphedema on the left lower extremity has increased a lot and she has a open ulceration as a result of this. The right lower extremity is looking pretty good. She has decided to by the compression stockings herself and will get reimbursed by the home health, at a later date. 05/27/2017 -- her sciatica is bothering her a lot and she thought her left leg pain was caused due to the compression wrap and hence removed it and has significant lymphedema. There is no inflammation on this left lower extremity. 06/17/17 on evaluation today patient appears to be doing very well and in fact is completely healed in regard to her ulcerations. Unfortunately however she does have continued issues with lymphedema nonetheless. We did order compression garments for her unfortunately she states that the size that she received were large although we ordered medium. Obviously this means she is not getting the optimal compression. She does not have those with her today and therefore we could not confirm and contact  the company on her behalf. Nonetheless she does state that she is going to have her husband bring them by tomorrow so that we can verify and then get in touch with the company. No fevers, chills, nausea, or vomiting noted at this time. Overall patient is doing better otherwise and I'm pleased with the progress she has made. 07/01/17 on evaluation today patient appears to  be doing very well in regard to her bilateral lower extremity she does not have any openings at this point which is excellent news. Overall I'm pleased with how things have progressed up to this time. Since she is doing so well we did order her compression which we are seeing her today to ensure that it fits her properly and everything is doing well in that regard and then subsequently she will be discharged. ============ Old Notes: 03/31/16 patient presents today for evaluation concerning open wounds that she has over the left medial ankle region as well as the left dorsal foot. She has previously had this occur although it has been healed for a number of months after having this for about a year prior until her hospitalization on 01/05/16. At that point in time it appears that she was admitted to the hospital for left lower extremity cellulitis and was placed on vancomycin and Zosyn at that point. Eventually upon discharge on January 15, 2016 she was placed on doxycycline at that point in time. Later on 03/27/16 positive wound culture growing Escherichia coli this was switched to amoxicillin. Currently she tells me that she is having pain radiated to be a 7 out of 10 which can be as high as 10 out of 10 with palpation and manipulation of the wound. This wound appears to be mainly venous in nature due to the bilateral lower extremity venous stasis/lymphedema. This is definitely much worse on her left than the right side. She does have type 1 diabetes mellitus, hypertension, morbid obesity, and is wheelchair dependent.during the course of the hospital stay a blood culture was also obtained and fortunately appeared negative. She also had an x-ray of the tibia/fibula on the left which showed no acute bone abnormality. Her white blood cell count which was performed last on 03/25/16 was 7.3, hemoglobin 12.8, protein 7.1, albumin 3.0. Her urine culture appeared to be negative for any specific  organisms. Patient did have a left lower extremity venous duplex evaluation for DVT. This did not include venous reflux studies but fortunately was negative for DVT. Patient also had arterial studies performed which revealed that she had a normal ABI on the right though this was unable to be performed on the left secondary to pain that she was having around the ankle region due to the wound. However it was stated on report that she had biphasic pulses and apparently good blood flow. ========== 06/03/17 she is here in follow-up evaluation for right lower extremity ulcer. The right lower sure he has healed but she has reopened to the left medial malleolus and dorsal foot with weeping. She is waiting for new compression garments to arrive from home health, the previous compression garments were ill fitting. We will continue with compression bilaterally and follow-up in 2 weeks Readmission: 08/05/17 on evaluation today patient appears to be doing somewhat poorly in regard to her left lower extremity especially although the right lower extremity has a small area which may no longer be open. She has been having a lot of drainage from the left lower extremity however he tells me that  she has not been able to use the EXTREMIT- EASE Compression at this point. She states that she did better and was able to actually apply the Juxta-Lite compression although the wound that she has is too large and therefore really does not compress which is why she cannot wear it at this point. She has no one who can help her put it on regular basis her son can sometimes but he's not able to do it most of the time. I do believe that's why she has begun to weave and have issues as she is currently yet again. No fevers, chills, nausea, or vomiting noted at this time. Patient is no evidence of dementia. 08/12/17 on evaluation today patient appears to still be doing fairly well in regard to the draining areas/weeping areas at  this point. With that being said she unfortunately did go to the ER yesterday due to what was felt to be possibly a cellulitis. They place her on doxycycline by mouth and discharge her home. She definitely was not admitted. With that being said she states she has had more discomfort which has been unusual for her even compared to prior times and she's had infections.08/12/17 on evaluation today patient appears to still be doing fairly well in regard to the draining areas/weeping areas at this point. With that being said she unfortunately did go to the ER yesterday due to what was felt to be possibly a cellulitis. They place her on doxycycline by mouth and discharge her home. She definitely was not admitted. With that being said she states she has had more discomfort which has been unusual for her even compared to prior times and she's had infections. 08/19/17 put evaluation today patient tells me that she's been having a lot of what sounds to be neuropathic type pain in regard to her left lower extremity. She has been using over-the-counter topical bins again which some believe. That in order to apply the she actually remove the wrap we put on her last Wednesday on Thursday. Subsequently she has not had anything on compression wise since that time. The good news is a lot of the weeping areas appear to have closed at this point again I believe she would do better with compression but we are struggling to get her to actually use what she needs to at this point. No fevers, chills, nausea, or vomiting noted at this time. 09/03/17 on evaluation today patient appears to be doing okay in regard to her lower extremities in regard to the lymphedema and weeping. Fortunately she does not seem to show any signs of infection at this point she does have a little bit of weeping occurring in the right medial malleolus area. With that being said this does not appear to be too significant which is good news. 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 She has severe skin damage secondary to both of these features involving the dorsal left foot and medial left ankle and lower leg. Still has open areas in the left anterior foot. The area on the right closed over. She uses her own juxta light stockings. She does not have an arterial issue 09/16/17 on evaluation today patient actually appears to be doing excellent in regard to her bilateral lower extremity swelling. The Juxta-Lite compression wrap seem to be doing very well for her. She has not however been using the portion that goes over her foot. Her left foot still is draining a  little bit not nearly as significant as it has been in the past but still I do believe that she likely needs to utilize the full wrap including the foot portion of this will improve as well. She also has been apparently putting on a significant amount of Vaseline which also think is not helpful for her. I recommended that if she feels she needs something for moisturizer Eucerin will probably be better. 09/30/17 on evaluation today patient presents with several new open areas in regard to her left lower extremity although these appear to be minimal and mainly seem to be more moisture breakdown than anything. Fortunately she does not seem to have any evidence of infection which is great news. She has been tolerating the dressing changes without complication we are using silver alginate on the foot she has been using AB pads to have the legs and using her Juxta-Lite compression which seems to be controlling her swelling very well. Overall I'm pleased with the poor way she has progressed. 10/14/17 on evaluation today patient appears to be doing better in regard to her left lower extremity areas of weeping. She does still have some discomfort although in general this does not appear to be as macerated and I think it  is progressing nicely. I do think she still needs to wear the foot portion of her Juxta-Lite in order to get the most benefit from the wrap obviously. She states she understands. Fortunately there does not appear to be evidence of infection at this time which is great news. 10/28/17 on evaluation today patient appears to be doing excellent in regard to her left lower extremity. She has just a couple areas that are still open and seem to be causing any trouble whatsoever. For that reason I think that she is definitely headed in the right direction the spots are very tiny compared to what we have been dealing with in the past. 11/11/17 on evaluation today patient appears to have a right lateral lower extremity ulcer that has opened since I last saw her. She states this is where the home health nurse that was coming out remove the dressing without wetting the alginate first. Nonetheless I do not know if this is indeed the case or not but more importantly we have not ordered home help to be coming out for her wounds at all. I'm unsure as to why they are coming out and we're gonna have to check on this and get things situated in that regard. With that being said we currently really do not need them to be coming out as the patient has been taking care of her leg herself without complication and no issues. In fact she was doing much better prior to nursing coming out. 11/25/17 on evaluation today patient actually appears to be doing fairly well in regard to her left lower extremity swelling. In fact she has very little area of weeping at this point there's just a small spot on the lateral portion of her right leg that still has me just a little bit more concerned as far as wanting to see this clear up before I discharge her to caring for this at home. Nonetheless overall she has made excellent progress. 12/09/17 on evaluation today patient appears to be doing rather well in regard to her lower extremity  edema. She does have some weeping still in the left lower extremity although the big area we were taking care of two weeks ago actually has closed and she has another  area of weeping on the left lower extremity immediately as well is the top of her foot. She does not currently have lymphedema pumps she has been wearing her compression daily on a regular basis as directed. With that being said I think she may benefit from lymphedema pumps. 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 We had a very difficult time getting and keeping this under control. 12/23/17 on evaluation today patient actually appears to be doing a little bit more poorly in regard to her bilateral lower extremities. She has been tolerating the Juxta-Lite compression wraps. Unfortunately she has two new ulcers on the right lower extremity and left lower Trinity ulceration seems to be larger. Obviously this is not good news. She has been tolerating the dressings without complication. 12/30/17 on evaluation today patient actually appears to be doing much better in regard to her bilateral lower extremity edema. She continues to have some issues with ulcerations and in fact there appears to be one spot on each leg where the wrap may have caused a little bit of a blister which is subsequently opened up at this point is given her pain. Fortunately it does not appear to be any evidence of infection which is good news. No fevers chills noted. 01/13/18 on evaluation today patient appears to be doing rather well in regard to her bilateral lower extremities. The dressings did get kind of stuck as far as the wound beds are concerned but again I think this is mainly due to the fact that she actually seems to be showing signs of healing which is good news. She's not having as much drainage  therefore she was having more of the dressing sticking. Nonetheless overall I feel like her swelling is dramatically down compared to previous. 01/20/18 on evaluation today patient unfortunately though she's doing better in most regards has a large blister on the left anterior lower extremity where she is draining quite significantly. Subsequently this is going to need debridement today in order to see what's underneath and ensure she does not continue to trapping fluid at this location. Nonetheless No fevers, chills, nausea, or vomiting noted at this time. 01/27/18 on evaluation today patient appears to be doing rather well at this point in regard to her right lower extremity there's just a very small area that she still has open at this point. With that being said I do believe that she is tolerating the compression wraps very well in making good progress. Home health is coming out at this point to see her. Her left lower extremity on the lateral portion is actually what still mainly open and causing her some discomfort for the most part 02/10/18 on evaluation today patient actually appears to be doing very well in regard to her right lower extremity were all the ulcers appear to be completely close. In regard to the left lower extremity she does have two areas still open and some leaking from the dorsal surface of her foot but this still seems to be doing much better to me in general. 02/24/18 on evaluation today patient actually appears to be doing much better in regard to her right lower extremity this is still completely healed. Her left lower extremity is also doing much better fortunately she has no evidence of infection. The one area that is gonna require some debridement is still  on the left anterior shin. Fortunately this is not hurting her as badly today. 03/10/18 on evaluation today patient appears to be doing better in some regards although she has a little bit more open area on the dorsal  foot and she also has some issues on the medial portion of the left lower extremity which is actually new and somewhat deep. With that being said there fortunately does not appear to be any significant signs of infection which is good news. No fevers, chills, nausea, or vomiting noted at this time. In general her swelling seems to be doing fairly well which is good news. 03/31/18 on evaluation today patient presents for follow-up concerning her left lower extremity lymphedema. Unfortunately she has been doing a little bit more poorly since I last saw her in regard to the amount of weeping that she is experiencing. She's also having some increased pain in the anterior shin location. Unfortunately I do not feel like the patient is making such good progress at this point a few weeks back she was definitely doing much better. 04/07/18 on evaluation today patient actually appears to be showing some signs of improvement as far as the left lower extremity is concerned. She has been tolerating the dressing changes and it does appear that the Drawtex did better for her. With that being said unfortunately home health is stating that they cannot obtain the Drawtex going forward. Nonetheless we're gonna have to check and see what they may be able to get the alginate they were using was getting stuck in causing new areas of skin being pulled all that with and subsequently weep and calls her to worsen overall this is the first time we've seen improvement at this time. 04/14/18 on evaluation today patient actually appears to be doing rather well at this point there does not appear to be any evidence of infection at this time and she is actually doing excellent in regard to the weeping in fact she almost has no openings remaining even compared to just last week this is a dramatic improvement. No fevers chills noted 04/21/18 evaluation today patient actually appears to be doing very well. She in fact is has a small  area on the posterior lower extremity location and she has a small area on the dorsal surface of her foot that are still open both of which are very close to closing. We're hoping this will be close shortly. She brought her Juxta-Lite wrap with her today hoping that would be able to put her in it unfortunately I don't think were quite at that point yet but we're getting closer. 04/28/18 upon evaluation today patient actually appears to be doing excellent in regard to her left lower extremity ulcer. In fact the region on the posterior lower extremity actually is much smaller than previously noted. Overall I'm very happy with the progress she has made. She again did bring her Juxta-Lite although we're not quite ready for that yet. 05/11/18 upon evaluation today patient actually appears to be doing in general fairly well in regard to her left lower Trinity. The swelling is very well controlled. With that being said she has a new area on the left anterior lower extremity as well as between the first and second toes of her left foot that was not present during the last evaluation. The region of her posterior left lower extremity actually appears to be almost completely healed. To be honest I'm very pleased with the way that stands. Nonetheless I do believe  that the lotion may be keeping the area to moist as far as her legs are concerned subsequently I'm gonna consider discontinuing that today. 05/26/18 on evaluation today patient appears to be doing rather well in regard to her left lower should be ulcers. In fact everything appears to be close except for a very small area on the left posterior lower extremity. Fortunately there does not appear to be any evidence of infection at this time. Overall very pleased with her progress. 06/02/18 and evaluation today patient actually appears to be doing very well in regard to her lower extremity ulcers. She has one small area that still continues to weep that I  think may benefit her being able to justify lotion and user Juxta-Lite wraps versus continued to wrap her. Nonetheless I think this is something we can definitely look into at this point. 06/23/18 on evaluation today patient unfortunately has openings of her bilateral lower extremities. In general she seems to be doing much worse than when I last saw her just as far as her overall health standpoint is concerned. She states that her discomfort is mainly due to neuropathy she's not having any other issues otherwise. No fevers, chills, nausea, or vomiting noted at this time. 06/30/18 on evaluation today patient actually appears to be doing a little worse in regard to her right lower extremity her left lower extremity of doing fairly well. Fortunately there is no sign of infection at this time. She has been tolerating the dressing changes without complication. Home health did not come out like they were supposed to for the appropriate wrap changes. They stated that they never received the orders from [place] which were fax. Nonetheless we will send a copy of the orders with the patient today as well. 07/07/18 on evaluation today patient appears to be doing much better in regard to lower extremities. She still has several openings bilaterally although since I last saw her her legs did show obvious signs of infection when she later saw her nurse. Subsequently a culture was obtained and she is been placed on Bactrim and Keflex. Fortunately things seem to be looking much better it does appear she likely had an infection. Again last week we'd even discussed it but again there really was not any obvious sign that she had infection therefore we held off on the antibiotics. Nonetheless I'm glad she's doing better today. 07/14/18 on evaluation today patient appears to be doing much better regarding her bilateral lower Trinity's. In fact on the right lower for me there's nothing open at this point there are some dry skin  areas at the sites where she had infection. Fortunately there is no evidence of systemic infection which is excellent news. No fevers chills noted 07/21/18 on evaluation today patient actually appears to be doing much better in regard to her left lower extremity ulcers. She is making good progress and overall I feel like she's improving each time I see her. She's having no pain I do feel like the infection is completely resolved which is excellent news. No fevers, chills, nausea, or vomiting noted at this time. 07/28/18 on evaluation today patient appears to be doing very well in regard to her left lower [employer]. Everything seems to be showing signs of improvement which is excellent news. Overall very pleased with the progress that has been made. Fortunately there's no evidence of active infection at this time also excellent news. 08/04/18 on evaluation today patient appears to be doing more poorly in regard  to her bilateral lower extremities. She has two new areas open up on the right and these were completely closed as of last week. She still has the two spots on the left which in my pinion seem to be doing better. Fortunately there's no evidence of infection again at this point. 08/11/18 on evaluation today patient actually appears to be doing very well in regard to her bilateral lower Trinity wounds that all seem to be doing better and are measures smaller today. Fortunately there's no signs of infection. No fevers, chills, nausea, or vomiting noted at this time. 08/18/18 on evaluation today patient actually appears to be doing about the same inverter bilateral lower extremities. She continues to have areas that blistering open as was drain that fortunately nothing too significant. Overall I feel like Drawtex may have done better for her however compared to the collagen. 08/25/18 on evaluation today patient appears to be doing a little bit more poorly today even compared to last time I saw  her. Again I'm not exactly sure why she's making worse progress over the past several weeks. I'm beginning to wonder if there is some kind of underlying low level infection causing this issue. I did actually take a culture from the left anterior lower extremity but it was a new wound draining quite a bit at this point. Unfortunately she also seems to be having more pain which is what also makes me worried about the possibility of infection. This is despite never erythema noted at this point. 09/01/18 on evaluation today patient actually appears to be doing a little worse even compared to last week in regard to bilateral lower extremities. She did go to the hospital on the 16th was given a dose of IV Zosyn and then discharged with a recommendation to continue with the Bactrim that I previously prescribed for her. Nonetheless she is still having a lot of discomfort she tells me as well at this time. This is definitely unfortunate. No fevers, chills, nausea, or vomiting noted at this time. 09/08/18 on evaluation today patient's bilateral lower extremities actually appear to be shown signs of improvement which is good news. Fortunately there does not appear to be any signs of active infection I think the anabiotic is helping in this regard. Overall I'm very pleased with how she is progressing. 09/15/18 patient was actually seen in ER yesterday due to her legs as well unfortunately. She states she's been having a lot of pain and discomfort as well as a lot of drainage. Upon inspection today the patient does have a lot of swelling and drainage I feel like this is more related to lymphedema and poor fluid control than it is to infection based on what I'm seeing. The physician in the emergency department also doubted that the patient was having a significant infection nonetheless I see no evidence of infection obvious at this point although I do see evidence of poor fluid control. She still not using a  compression pumps, she is not elevating due to her lift chair as well as her hospital bed being broken, and she really is not keeping her legs up as much as they should be and also has been taking off her wraps. All this combined I think has led to poor fluid control and to be honest she may be somewhat volume overloaded in general as well. I recommend that she may need to contact your physician to see if a prescription for a diuretic would be beneficial in their opinion.  As long as this is safe I think it would likely help her. 09/29/18 on evaluation today patient's left lower extremity actually appears to be doing quite a bit better. At least compared to last time that I saw her. She still has a large area where she is draining from but there's a lot of new skin speckled trout and in fact there's more new skin that there are open areas of weeping and drainage at this point. This is good news. With regard to the right lower extremity this is doing much better with the only open area that I really see being a dry spot on the right lateral ankle currently. Fortunately there's no signs of active infection at this time which is good news. No fevers, chills, nausea, or vomiting noted at this time. The patient seems somewhat stressed and overwhelmed during the visit today she was very lethargic as such. She does and she is not taking any pain medications at this point. Apparently according to her husband are also in the process of moving which is probably taking its toll on her as well. 10/06/18 on evaluation today patient appears to be doing rather well in regard to her lower extremities compared to last evaluation. Fortunately there's no signs of active infection. She tells me she did have an appointment with her primary. Nonetheless he was concerned that the wounds were somewhat deep based on pictures but we never actually saw her legs. She states that he had her somewhat worried due to the fact that  she was fearing now that she was [place] have to have an amputation. With that being said based on what I'm seeing check she looks better this week that she has the last two times I've seen her with much less drainage I'm actually pleased in this regard. That doesn't mean that she's out of the water but again I do not think what the point of talking about education at all in regard to her leg. She is very happy to hear this. She is also not having as much pain as she was having last week. 10/13/18 unfortunately on evaluation today patient still continues to have a significant amount of drainage she's not letting home health actually apply the compression dressings at this point. She's trying to use of Juxta-Lite of the top of Kerlex and the second layer of the three layer compression wrap. With that being said she just does not seem to be making as good a progress as I would expect if she was having the compression applied and in place on a regular basis. No fevers, chills, nausea, or vomiting noted at this time. 10/20/18 on evaluation today patient appears to be doing a little better in regard to her bilateral lower extremity ulcers. In fact the right lower extremity seems to be healed she doesn't even have any openings at this point left lower extremity though still somewhat macerated seems to be showing signs of new skin growth at multiple locations throughout. Fortunately there's no evidence of active infection at this time. No fevers, chills, nausea, or vomiting noted at this time. 10/27/18 on evaluation today patient appears to be doing much better in regard to her left lower Trinity ulcer. She's been tolerating the laptop complication and has minimal drainage noted at this point. Fortunately there's no signs of active infection at this time. No fevers, chills, nausea, or vomiting noted at this time. 11/03/18 on evaluation today patient actually appears to be doing excellent in regard to  her left  lower extremity. She is having very little drainage at this point there does not appear to be any significant signs of infection overall very pleased with how things have gone. She is likewise extremely pleased still and seems to be making wonderful progress week to week. I do believe antibiotics were helpful for her. Her primary care provider did place on amateur clean since I last saw her. 11/17/18 on evaluation today patient appears to be doing worse in regard to her bilateral lower extremities at this point. She is been tolerating the dressing changes without complication. With that being said she typically takes the Coban off fairly quickly upon arriving home even after being seen here in the clinic and does not allow home health reapply command as part of the dressing at home. Therefore she said no compression essentially since I last saw her as best I can tell. With that being said I think it shows and how much swelling she has in the open wounds that are noted at this point. Fortunately there's no signs of infection but unfortunately if she doesn't get this under control I think she will end up with infection and more significant issues. 11/24/18 on evaluation today patient actually appears to be doing somewhat better in regard to her bilateral lower extremities. 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 Nonetheless that is doing better currently so she should be able to attempt reinitiating the lymphedema pumps at this time. No fevers, chills, nausea, or vomiting noted at this time. 12/01/18 upon evaluation today patient's left lower extremity appears to be doing quite well unfortunately her right lower extremity is not doing nearly as well. She has been tolerating the dressing changes without complication unfortunately she did not keep a wrap on the  right at this time. Nonetheless I believe this has led to increased swelling and weeping in the world is actually much larger than during the last evaluation with her. 12/08/18 on evaluation today patient appears to be doing about the same at this point in regard to her right lower extremity. There is some more palatable to touch I'm concerned about the possibility of there being some infection although I think the main issue is she's not keeping her compression wrap on which in turn is not allowing this area to heal appropriately. 12/22/18 on evaluation today patient appears to be doing better in regard to left lower extremity unfortunately significantly worse in regard to the right lower extremity. The areas of blistering and necrotic superficial tissue have spread and again this does not really appear to be signs of infection and all she just doesn't seem to be doing nearly as well is what she has been in the past. Overall I feel like the Augmentin did absolutely nothing for her she doesn't seem to have any infection again I really didn't think so last time either is more of a potential preventative measure and hoping that this would make some difference but I think the main issue is she's not wearing her compression. She tells me she cannot wear the [place] been we put on she takes it off pretty much upon getting home. Subsequently she worshiped Juxta-Lite when I questioned her about how often she wears it this is no more than three hours a day obviously that leaves 21  hours that she has no compression and this is obviously not doing well for her. Overall I'm concerned that if things continue to worsen she is at great risk of both infection as well as losing her leg. 01/05/19 on evaluation today patient appears to be doing well in regard to her left lower extremity which he is allowing [place] to wrap and not so well with regard to her right lower extremity which she is not allowing [place] to really wrap  and keep the wrap on. She states that it hurts too badly whenever it's wrapped and she ends up having to take it off. She's been using the Juxta-Lite she tells me up to six hours a day although I question whether or not that's really been the case to be honest. Previously she told me three hours today nonetheless obviously the legs as long as the wrap is doing great when she is not is doing much more poorly. 01/12/2019 on evaluation today patient actually appears to be doing a little better in my opinion with regard to her right lower extremity ulcer. She has a small open area on the left lower extremity unfortunately but again this I think is part of the normal fluctuation of what she is going to have to expect with regard to her legs especially when she is not using her lymphedema pumps on a regular basis. Subsequently based on what I am seeing today I think that she does seem to be doing slightly better with regard to her right lower extremity she did see her primary care provider on Monday they felt she had an infection and placed her on 2 antibiotics. Both Cipro and clindamycin. Subsequently again she seems possibly to be doing a little bit better in regards to the right lower extremity she also tells me however she has been wearing the compression wrap over the past week since I spoke with her as well that is a Kerlix and Coban wrap on the right. No fevers, chills, nausea, vomiting, or diarrhea. 01/19/2019 on evaluation today patient appears to be doing better with regard to her bilateral lower extremities especially the right. I feel like the compression has been beneficial for her which is great news. She did get a call from her primary care provider on her way here today telling her that she did have methicillin-resistant Staphylococcus aureus and he was calling in a couple new antibiotics for her including a ointment to be applied she tells me 3 times a day. With that being said this sounds  like likely to be Bactroban which I think could be applied with each dressing/wrap change but I would not be able to accommodate her applying this 3 times a day. She is in agreement with the least doing this we will add that to her orders today. 01/26/2019 on evaluation today patient actually appears to be doing much better with regard to her right lower extremity. Her left lower extremity is also doing quite well all things considering. Fortunately there is no evidence of active infection at this time. No fevers, chills, nausea, vomiting, or diarrhea. 02/02/2019 on evaluation today patient appears to be doing much better compared to her last evaluation. Little by little off like her right leg is returning more towards normal. There does not appear to be any signs of active infection and overall she seems to be doing quite well which is great news. I am very pleased in this regard. No fevers, chills, nausea, vomiting, or diarrhea. 02/09/2019  upon evaluation today patient appears to be doing better with regard to her bilateral lower extremities. She has been tolerating the dressing changes without complication. Fortunately there is no signs of active infection at this time. No fevers, chills, nausea, vomiting, or diarrhea. 02/23/2019 on evaluation today patient actually appears to be doing quite well with regard to her bilateral lower extremities. She has been tolerating the dressing changes without complication. She is even used her pumps one time and states that she really felt like it felt good. With that being said she seems to be in good spirits and her legs appear to be doing excellent. 03/09/2019 on evaluation today patient appears to be doing well with regard to her right lower extremity there are no open wounds at this time she is having some discomfort but I feel like this is more neuropathy than anything. With regard to her left lower extremity she had several areas scattered around that she  does have some weeping and drainage from but again overall she does not appear to be having any significant issues and no evidence of infection at this time which is good news. 03/23/2019 on evaluation today patient appears to be doing well with regard to her right lower extremity which she tells me is still close she is using her juxta light here. Her left lower extremity she mainly just has an area on the foot which is still slightly draining although this also is doing great. Overall very pleased at this time. 04/06/2019 patient appears to be doing a little bit worse in regard to her left lower extremity upon evaluation today. She feels like this could be becoming infected again which she had issues with previous. Fortunately there is no signs of systemic infection but again this is always a struggle with her with her legs she will go from doing well to not so well in a very short amount of time. 04/20/2019 on evaluation today patient actually appears to be doing quite well with regard to her right lower extremity I do not see any signs of active infection at this time. Fortunately there is no fever chills noted. She is still taking the antibiotics which I prescribed for her at this point. In regard to the left lower extremity I do feel like some of these areas are better although again she still is having weeping from several locations at this time. Electronic Signature(s) Signed: 04/20/2019 6:48:11 PM By: Lenda Kelp PA-C Entered By: Lenda Kelp on 04/20/2019 11:35:43 -------------------------------------------------------------------------------- Physical Exam Details Patient Name: Date of Service: REVECA, DESMARAIS 04/20/2019 9:30 AM Medical Record [id number] Patient Account Number: [id number] Date of Birth/Sex: Treating RN: 02-03-1951 (68 y.o. [name] Primary Care Provider: Fatima Sanger Other Clinician: Referring Provider: Treating Provider/Extender:Stone III,  Flo Shanks, FRED Weeks in Treatment: 23 Constitutional Well-nourished and well-hydrated in no acute distress. Respiratory normal breathing without difficulty. clear to auscultation bilaterally. Cardiovascular regular rate and rhythm with normal S1, S2. Psychiatric this patient is able to make decisions and demonstrates good insight into disease process. Alert and Oriented x 3. pleasant and cooperative. Notes Patient's wound bed currently showed signs of good granulation at this point at most locations with good epithelization as well she does still have weeping at several spots but fortunately nothing too significant. I do believe the gentamicin along with the alginate has been beneficial for her. Electronic Signature(s) Signed: 04/20/2019 6:48:11 PM By: Lenda Kelp PA-C Entered By: Lenda Kelp on 04/20/2019 11:36:14 --------------------------------------------------------------------------------  Physician Orders Details Patient Name: Date of Service: ONEDIA, VARGUS 04/20/2019 9:30 AM Medical Record [id number] Patient Account Number: [id number] Date of Birth/Sex: Treating RN: 23-Sep-1950 (68 y.o. [name] Primary Care Provider: Fatima Sanger Other Clinician: Referring Provider: Treating Provider/Extender:Stone III, Flo Shanks, FRED Weeks in Treatment: 35 Verbal / Phone Orders: No Diagnosis Coding ICD-10 Coding Code Description E11.622 Type 2 diabetes mellitus with other skin ulcer I89.0 Lymphedema, not elsewhere classified I87.331 Chronic venous hypertension (idiopathic) with ulcer and inflammation of right lower extremity I87.332 Chronic venous hypertension (idiopathic) with ulcer and inflammation of left lower extremity L97.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 leg with fat layer exposed L97.822 Non-pressure chronic ulcer of other part of left lower leg with fat layer exposed I10 Essential (primary) hypertension E66.01 Morbid  (severe) obesity due to excess calories F41.8 Other specified anxiety disorders R53.1 Weakness Follow-up Appointments Return Appointment in 1 week. Dressing Change Frequency Wound #57 Left,Dorsal Foot Change dressing three times week. Wound Cleansing Wound #57 Left,Dorsal Foot May shower with protection. - left leg Primary Wound Dressing Wound #57 Left,Dorsal Foot Calcium Alginate with Silver Other: - Gentamicin ointment to wound bed under alginate (use Mupirocin in clinic) Wound #58 Left,Medial Ankle Calcium Alginate with Silver Other: - Gentamicin ointment to wound bed under alginate (use Mupirocin in clinic) Wound #59 Left,Medial Lower Leg Calcium Alginate with Silver Other: - Gentamicin ointment to wound bed under alginate (use Mupirocin in clinic) Wound #60 Left,Posterior Lower Leg Calcium Alginate with Silver Other: - Gentamicin ointment to wound bed under alginate (use Mupirocin in clinic) Wound #61 Left,Anterior Lower Leg Calcium Alginate with Silver Other: - Gentamicin ointment to wound bed under alginate (use Mupirocin in clinic) Secondary Dressing Dry Gauze - all wounds ABD pad - as needed Edema Control 3 Layer Compression System - Left Lower Extremity Avoid standing for long periods of time - walking is encouraged Elevate legs to the level of the heart or above for 30 minutes daily and/or when sitting, a frequency of: - do not sleep in chair with feet dangling Exercise regularly Support Garment 20-30 mm/Hg pressure to: - Juxtalite to right lower leg daily Segmental Compressive Device. - lymphedema pumps 60 minutes 1- 2 times per day Additional Orders / Instructions Follow Nutritious Diet - To include vitamin A, vitamin C, and Zinc along with increased protein intake. Home Health Continue Home Health skilled nursing for wound care. - [facility] Electronic Signature(s) Signed: 04/20/2019 6:42:58 PM By: Zenaida Deed RN, BSN Signed: 04/20/2019 6:48:11 PM By: Lenda Kelp PA-C Entered By: Zenaida Deed on 04/20/2019 11:33:09 -------------------------------------------------------------------------------- Problem List Details Patient Name: Date of Service: [name]. 04/20/2019 9:30 AM Medical Record [id number] Patient Account Number: [id number] Date of Birth/Sex: Treating RN: July 15, 1950 (67 y.o. [name] Primary Care Provider: Other Clinician: Fatima Sanger Referring Provider: Treating Provider/Extender:Stone III, Flo Shanks, FRED Weeks in Treatment: 25 Active Problems ICD-10 Evaluated Encounter Code Description Active Date Today Diagnosis E11.622 Type 2 diabetes mellitus with other skin ulcer 08/05/2017 No Yes I89.0 Lymphedema, not elsewhere classified 08/05/2017 No Yes I87.331 Chronic venous hypertension (idiopathic) with ulcer 08/05/2017 No Yes and inflammation of right lower extremity I87.332 Chronic venous hypertension (idiopathic) with ulcer 08/05/2017 No Yes and inflammation of left lower extremity L97.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 08/05/2017 No Yes leg with fat layer exposed L97.822 Non-pressure chronic ulcer of other part of left lower 08/05/2017 No Yes leg with fat layer exposed I10 Essential (primary)  hypertension 08/05/2017 No Yes E66.01 Morbid (severe) obesity due to excess calories 08/05/2017 No Yes F41.8 Other specified anxiety disorders 08/05/2017 No Yes R53.1 Weakness 08/05/2017 No Yes Inactive Problems Resolved Problems Electronic Signature(s) Signed: 04/20/2019 6:48:11 PM By: [name] PA-C Entered By: [name] on 04/20/2019 10:17:51 -------------------------------------------------------------------------------- Progress Note Details Patient Name: Date of Service: [name] 04/20/2019 9:30 AM Medical Record [id number] Patient Account Number: [id number] Date of Birth/Sex: Treating RN: 04/09/1951 (67 y.o. [name] Primary Care  Provider: [name] Other Clinician: Referring Provider: Treating Provider/Extender:Stone III, [name], FRED Weeks in Treatment: 98 Subjective Chief Complaint Information obtained from Patient Bilateral reoccurring LE ulcers History of Present Illness (HPI) this patient has been seen a couple of times before and returns with recurrent problems to her right and left lower extremity with swelling and weeping ulcerations due to not wearing her compression stockings which she had been advised to do during her last discharge, at the end of June 2018. During her last visit the patient had had normal arterial blood flow and her venous reflux study did not necessitate any surgical intervention. She was recommended compression and elevation and wound care. After prolonged treatment the patient was completely healed but she has been noncompliant with wearing or compressions.. She was here last week with an outpatient return visit planned but the patient came in a very poor general condition with altered mental status and was rushed to the ER on my request. With a history of hypertension, diabetes, TIA and right-sided weakness she was set up for an MRI on her brain and cervical spine and was sent to [facility] Getting an MRI done was very difficult but once the workup was done she was found not to have any spinal stenosis, epidural abscess or hematoma or discitis. This was radiculopathy to be treated as an outpatient and she was given a follow-up appointment. Today she is feeling much better alert and oriented and has come to reevaluate her bilateral lower extremity lymphedema and ulceration 03/25/2017 -- she was admitted to the hospital on 03/16/2017 and discharged on 03/18/2017 with left leg cellulitis and ulceration. She was started on vancomycin and Zosyn and x-ray showed no bony involvement. She was treated for a cellulitis with IV antibiotics changed to Rocephin and Flagyl and was  discharged on oral Keflex and doxycycline to complete a 7 day course. Last hemoglobin A1c was 70.1 and her other ailments including hypertension got asthma were appropriately treated. 05/06/2017 -- she is awaiting the right size of compression stockings from [place] but other than that has been doing well. ====== Old notes 68 year old patient was seen one time last October and was lost to follow-up. She has recurrent problems with weeping and ulceration of her left lower extremity and has swelling of this for several years. It has been worse for the last 2 months. Past medical history is significant for diabetes mellitus type 2, hypertension, gout, morbid obesity, depressive disorders, hiatal hernia, migraines, status post knee surgery, risk of a cholecystectomy, vaginal hysterectomy and breast biopsy. She is not a smoker. As noted before she has never had a venous duplex study and an arterial ABI study was attempted but the left lower extremity was noncompressible 10/01/2016 -- had a lower extremity venous duplex reflux evaluation which showed no evidence of deep vein reflux in the right or left lower extremity, and no evidence of great saphenous vein reflux more than 500 ms in the right or left lower extremity, and  the left small saphenous vein is incompetent but no vascular consult was recommended. review of her electronic medical records noted that the ABI was checked in July 2017 where the right ABI was normal limits and the left ABI could not be ascertained due to pain with cuff pressure but the waveforms are within normal limits. her arterial duplex study scheduled for April 27. 10/08/2016 -- the patient has various reasons for not having a compression on and for the last 3 days she has had no compression on her left lower extremity either due to pain or the lack of nursing help. She does not use her juxta lites either. 10/15/2016 -- the patient did not keep her appointment for  arterial duplex study on April 27 and I have asked her to reschedule this. Her pain is out of proportion with the physical findings and she continuously fails to wear a compression wraps and cuts them off because she says she cannot tolerate the pain. She does not use her juxta lites either. 10/22/2016 -- he has rescheduled her arterial duplex study to May 21 and her pain today is a bit better. She has not been wearing her juxta lites on her right lower extremity but now understands that she needs to do this. She did tolerate the to press compression wrap on her left lower extremity 10/29/2016 --arterial duplex study is scheduled for next week and overall she has been tolerating her compression wraps and also using her juxta lites on her right lower extremity 11/05/2016 -- the right ABI was 0.95 the left was 1.03. The digit TBI is on the right was 0.83 on the left was 0.92 and she had biphasic flow through these vessels. The impression was that of normal lower extremity arterial study. 11/12/2016 -- her pain is minimal and she is doing very well overall. 11/26/2016 -- she has got juxta lites and her insurance will not pay for additional dual layer compression stockings. She is going to order some from [facility]. 05/12/2017 -- her juxta lites are very old and too big for her and these have not been helping with compression. She did get 20-30 mm compression stockings from [facility] but she and her husband are unable to put these on. I believe she will benefit from bilateral Extremit-ease, compression stockings and we will measure her for these today. 05/20/2017 -- lymphedema on the left lower extremity has increased a lot and she has a open ulceration as a result of this. The right lower extremity is looking pretty good. She has decided to by the compression stockings herself and will get reimbursed by the home health, at a later date. 05/27/2017 -- her sciatica is bothering her a lot and she  thought her left leg pain was caused due to the compression wrap and hence removed it and has significant lymphedema. There is no inflammation on this left lower extremity. 06/17/17 on evaluation today patient appears to be doing very well and in fact is completely healed in regard to her ulcerations. Unfortunately however she does have continued issues with lymphedema nonetheless. We did order compression garments for her unfortunately she states that the size that she received were large although we ordered medium. Obviously this means she is not getting the optimal compression. She does not have those with her today and therefore we could not confirm and contact the company on her behalf. Nonetheless she does state that she is going to have her husband bring them by tomorrow so that we can verify  and then get in touch with the company. No fevers, chills, nausea, or vomiting noted at this time. Overall patient is doing better otherwise and I'm pleased with the progress she has made. 07/01/17 on evaluation today patient appears to be doing very well in regard to her bilateral lower extremity she does not have any openings at this point which is excellent news. Overall I'm pleased with how things have progressed up to this time. Since she is doing so well we did order her compression which we are seeing her today to ensure that it fits her properly and everything is doing well in that regard and then subsequently she will be discharged. ============ Old Notes: 03/31/16 patient presents today for evaluation concerning open wounds that she has over the left medial ankle region as well as the left dorsal foot. She has previously had this occur although it has been healed for a number of months after having this for about a year prior until her hospitalization on 01/05/16. At that point in time it appears that she was admitted to the hospital for left lower extremity cellulitis and was placed on  vancomycin and Zosyn at that point. Eventually upon discharge on January 15, 2016 she was placed on doxycycline at that point in time. Later on 03/27/16 positive wound culture growing Escherichia coli this was switched to amoxicillin. Currently she tells me that she is having pain radiated to be a 7 out of 10 which can be as high as 10 out of 10 with palpation and manipulation of the wound. This wound appears to be mainly venous in nature due to the bilateral lower extremity venous stasis/lymphedema. This is definitely much worse on her left than the right side. She does have type 1 diabetes mellitus, hypertension, morbid obesity, and is wheelchair dependent.during the course of the hospital stay a blood culture was also obtained and fortunately appeared negative. She also had an x-ray of the tibia/fibula on the left which showed no acute bone abnormality. Her white blood cell count which was performed last on 03/25/16 was 7.3, hemoglobin 12.8, protein 7.1, albumin 3.0. Her urine culture appeared to be negative for any specific organisms. Patient did have a left lower extremity venous duplex evaluation for DVT. This did not include venous reflux studies but fortunately was negative for DVT. Patient also had arterial studies performed which revealed that she had a normal ABI on the right though this was unable to be performed on the left secondary to pain that she was having around the ankle region due to the wound. However it was stated on report that she had biphasic pulses and apparently good blood flow. ========== 06/03/17 she is here in follow-up evaluation for right lower extremity ulcer. The right lower sure he has healed but she has reopened to the left medial malleolus and dorsal foot with weeping. She is waiting for new compression garments to arrive from home health, the previous compression garments were ill fitting. We will continue with compression bilaterally and follow-up in 2  weeks Readmission: 08/05/17 on evaluation today patient appears to be doing somewhat poorly in regard to her left lower extremity especially although the right lower extremity has a small area which may no longer be open. She has been having a lot of drainage from the left lower extremity however he tells me that she has not been able to use the EXTREMIT- EASE Compression at this point. She states that she did better and was able to actually apply  the Juxta-Lite compression although the wound that she has is too large and therefore really does not compress which is why she cannot wear it at this point. She has no one who can help her put it on regular basis her son can sometimes but he's not able to do it most of the time. I do believe that's why she has begun to weave and have issues as she is currently yet again. No fevers, chills, nausea, or vomiting noted at this time. Patient is no evidence of dementia. 08/12/17 on evaluation today patient appears to still be doing fairly well in regard to the draining areas/weeping areas at this point. With that being said she unfortunately did go to the ER yesterday due to what was felt to be possibly a cellulitis. They place her on doxycycline by mouth and discharge her home. She definitely was not admitted. With that being said she states she has had more discomfort which has been unusual for her even compared to prior times and she's had infections.08/12/17 on evaluation today patient appears to still be doing fairly well in regard to the draining areas/weeping areas at this point. With that being said she unfortunately did go to the ER yesterday due to what was felt to be possibly a cellulitis. They place her on doxycycline by mouth and discharge her home. She definitely was not admitted. With that being said she states she has had more discomfort which has been unusual for her even compared to prior times and she's had infections. 08/19/17 put evaluation  today patient tells me that she's been having a lot of what sounds to be neuropathic type pain in regard to her left lower extremity. She has been using over-the-counter topical bins again which some believe. That in order to apply the she actually remove the wrap we put on her last Wednesday on Thursday. Subsequently she has not had anything on compression wise since that time. The good news is a lot of the weeping areas appear to have closed at this point again I believe she would do better with compression but we are struggling to get her to actually use what she needs to at this point. No fevers, chills, nausea, or vomiting noted at this time. 09/03/17 on evaluation today patient appears to be doing okay in regard to her lower extremities in regard to the lymphedema and weeping. Fortunately she does not seem to show any signs of infection at this point she does have a little bit of weeping occurring in the right medial malleolus area. With that being said this does not appear to be too significant which is good news. 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 She has severe skin damage secondary to both of these features involving the dorsal left foot and medial left ankle and lower leg. Still has open areas in the left anterior foot. The area on the right closed over. She uses her own juxta light stockings. She does not have an arterial issue 09/16/17 on evaluation today patient actually appears to be doing excellent in regard to her bilateral lower extremity swelling. The Juxta-Lite compression wrap seem to be doing very well for her. She has not however been using the portion that goes over her foot. Her left foot still is draining a little bit not nearly as significant as it has been in the past but still I do believe that she likely needs to utilize the full wrap  including the foot portion of this will improve as well. She  also has been apparently putting on a significant amount of Vaseline which also think is not helpful for her. I recommended that if she feels she needs something for moisturizer Eucerin will probably be better. 09/30/17 on evaluation today patient presents with several new open areas in regard to her left lower extremity although these appear to be minimal and mainly seem to be more moisture breakdown than anything. Fortunately she does not seem to have any evidence of infection which is great news. She has been tolerating the dressing changes without complication we are using silver alginate on the foot she has been using AB pads to have the legs and using her Juxta-Lite compression which seems to be controlling her swelling very well. Overall I'm pleased with the poor way she has progressed. 10/14/17 on evaluation today patient appears to be doing better in regard to her left lower extremity areas of weeping. She does still have some discomfort although in general this does not appear to be as macerated and I think it is progressing nicely. I do think she still needs to wear the foot portion of her Juxta-Lite in order to get the most benefit from the wrap obviously. She states she understands. Fortunately there does not appear to be evidence of infection at this time which is great news. 10/28/17 on evaluation today patient appears to be doing excellent in regard to her left lower extremity. She has just a couple areas that are still open and seem to be causing any trouble whatsoever. For that reason I think that she is definitely headed in the right direction the spots are very tiny compared to what we have been dealing with in the past. 11/11/17 on evaluation today patient appears to have a right lateral lower extremity ulcer that has opened since I last saw her. She states this is where the home health nurse that was coming out remove the dressing without wetting the alginate first. Nonetheless I  do not know if this is indeed the case or not but more importantly we have not ordered home help to be coming out for her wounds at all. I'm unsure as to why they are coming out and we're gonna have to check on this and get things situated in that regard. With that being said we currently really do not need them to be coming out as the patient has been taking care of her leg herself without complication and no issues. In fact she was doing much better prior to nursing coming out. 11/25/17 on evaluation today patient actually appears to be doing fairly well in regard to her left lower extremity swelling. In fact she has very little area of weeping at this point there's just a small spot on the lateral portion of her right leg that still has me just a little bit more concerned as far as wanting to see this clear up before I discharge her to caring for this at home. Nonetheless overall she has made excellent progress. 12/09/17 on evaluation today patient appears to be doing rather well in regard to her lower extremity edema. She does have some weeping still in the left lower extremity although the big area we were taking care of two weeks ago actually has closed and she has another area of weeping on the left lower extremity immediately as well is the top of her foot. She does not currently have lymphedema pumps she has been  wearing her compression daily on a regular basis as directed. With that being said I think she may benefit from lymphedema pumps. 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 We had a very difficult time getting and keeping this under control. 12/23/17 on evaluation today patient actually appears to be doing a little bit more poorly in regard to her bilateral lower extremities. She has been tolerating the Juxta-Lite compression wraps.  Unfortunately she has two new ulcers on the right lower extremity and left lower Trinity ulceration seems to be larger. Obviously this is not good news. She has been tolerating the dressings without complication. 12/30/17 on evaluation today patient actually appears to be doing much better in regard to her bilateral lower extremity edema. She continues to have some issues with ulcerations and in fact there appears to be one spot on each leg where the wrap may have caused a little bit of a blister which is subsequently opened up at this point is given her pain. Fortunately it does not appear to be any evidence of infection which is good news. No fevers chills noted. 01/13/18 on evaluation today patient appears to be doing rather well in regard to her bilateral lower extremities. The dressings did get kind of stuck as far as the wound beds are concerned but again I think this is mainly due to the fact that she actually seems to be showing signs of healing which is good news. She's not having as much drainage therefore she was having more of the dressing sticking. Nonetheless overall I feel like her swelling is dramatically down compared to previous. 01/20/18 on evaluation today patient unfortunately though she's doing better in most regards has a large blister on the left anterior lower extremity where she is draining quite significantly. Subsequently this is going to need debridement today in order to see what's underneath and ensure she does not continue to trapping fluid at this location. Nonetheless No fevers, chills, nausea, or vomiting noted at this time. 01/27/18 on evaluation today patient appears to be doing rather well at this point in regard to her right lower extremity there's just a very small area that she still has open at this point. With that being said I do believe that she is tolerating the compression wraps very well in making good progress. Home health is coming out at this point to  see her. Her left lower extremity on the lateral portion is actually what still mainly open and causing her some discomfort for the most part 02/10/18 on evaluation today patient actually appears to be doing very well in regard to her right lower extremity were all the ulcers appear to be completely close. In regard to the left lower extremity she does have two areas still open and some leaking from the dorsal surface of her foot but this still seems to be doing much better to me in general. 02/24/18 on evaluation today patient actually appears to be doing much better in regard to her right lower extremity this is still completely healed. Her left lower extremity is also doing much better fortunately she has no evidence of infection. The one area that is gonna require some debridement is still on the left anterior shin. Fortunately this is not hurting her as badly today. 03/10/18 on evaluation today patient appears to be doing better in some  regards although she has a little bit more open area on the dorsal foot and she also has some issues on the medial portion of the left lower extremity which is actually new and somewhat deep. With that being said there fortunately does not appear to be any significant signs of infection which is good news. No fevers, chills, nausea, or vomiting noted at this time. In general her swelling seems to be doing fairly well which is good news. 03/31/18 on evaluation today patient presents for follow-up concerning her left lower extremity lymphedema. Unfortunately she has been doing a little bit more poorly since I last saw her in regard to the amount of weeping that she is experiencing. She's also having some increased pain in the anterior shin location. Unfortunately I do not feel like the patient is making such good progress at this point a few weeks back she was definitely doing much better. 04/07/18 on evaluation today patient actually appears to be showing some  signs of improvement as far as the left lower extremity is concerned. She has been tolerating the dressing changes and it does appear that the Drawtex did better for her. With that being said unfortunately home health is stating that they cannot obtain the Drawtex going forward. Nonetheless we're gonna have to check and see what they may be able to get the alginate they were using was getting stuck in causing new areas of skin being pulled all that with and subsequently weep and calls her to worsen overall this is the first time we've seen improvement at this time. 04/14/18 on evaluation today patient actually appears to be doing rather well at this point there does not appear to be any evidence of infection at this time and she is actually doing excellent in regard to the weeping in fact she almost has no openings remaining even compared to just last week this is a dramatic improvement. No fevers chills noted 04/21/18 evaluation today patient actually appears to be doing very well. She in fact is has a small area on the posterior lower extremity location and she has a small area on the dorsal surface of her foot that are still open both of which are very close to closing. We're hoping this will be close shortly. She brought her Juxta-Lite wrap with her today hoping that would be able to put her in it unfortunately I don't think were quite at that point yet but we're getting closer. 04/28/18 upon evaluation today patient actually appears to be doing excellent in regard to her left lower extremity ulcer. In fact the region on the posterior lower extremity actually is much smaller than previously noted. Overall I'm very happy with the progress she has made. She again did bring her Juxta-Lite although we're not quite ready for that yet. 05/11/18 upon evaluation today patient actually appears to be doing in general fairly well in regard to her left lower Trinity. The swelling is very well controlled.  With that being said she has a new area on the left anterior lower extremity as well as between the first and second toes of her left foot that was not present during the last evaluation. The region of her posterior left lower extremity actually appears to be almost completely healed. To be honest I'm very pleased with the way that stands. Nonetheless I do believe that the lotion may be keeping the area to moist as far as her legs are concerned subsequently I'm gonna consider discontinuing that today. 05/26/18 on  evaluation today patient appears to be doing rather well in regard to her left lower should be ulcers. In fact everything appears to be close except for a very small area on the left posterior lower extremity. Fortunately there does not appear to be any evidence of infection at this time. Overall very pleased with her progress. 06/02/18 and evaluation today patient actually appears to be doing very well in regard to her lower extremity ulcers. She has one small area that still continues to weep that I think may benefit her being able to justify lotion and user Juxta-Lite wraps versus continued to wrap her. Nonetheless I think this is something we can definitely look into at this point. 06/23/18 on evaluation today patient unfortunately has openings of her bilateral lower extremities. In general she seems to be doing much worse than when I last saw her just as far as her overall health standpoint is concerned. She states that her discomfort is mainly due to neuropathy she's not having any other issues otherwise. No fevers, chills, nausea, or vomiting noted at this time. 06/30/18 on evaluation today patient actually appears to be doing a little worse in regard to her right lower extremity her left lower extremity of doing fairly well. Fortunately there is no sign of infection at this time. She has been tolerating the dressing changes without complication. Home health did not come out like they  were supposed to for the appropriate wrap changes. They stated that they never received the orders from [place] which were fax. Nonetheless we will send a copy of the orders with the patient today as well. 07/07/18 on evaluation today patient appears to be doing much better in regard to lower extremities. She still has several openings bilaterally although since I last saw her her legs did show obvious signs of infection when she later saw her nurse. Subsequently a culture was obtained and she is been placed on Bactrim and Keflex. Fortunately things seem to be looking much better it does appear she likely had an infection. Again last week we'd even discussed it but again there really was not any obvious sign that she had infection therefore we held off on the antibiotics. Nonetheless I'm glad she's doing better today. 07/14/18 on evaluation today patient appears to be doing much better regarding her bilateral lower Trinity's. In fact on the right lower for me there's nothing open at this point there are some dry skin areas at the sites where she had infection. Fortunately there is no evidence of systemic infection which is excellent news. No fevers chills noted 07/21/18 on evaluation today patient actually appears to be doing much better in regard to her left lower extremity ulcers. She is making good progress and overall I feel like she's improving each time I see her. She's having no pain I do feel like the infection is completely resolved which is excellent news. No fevers, chills, nausea, or vomiting noted at this time. 07/28/18 on evaluation today patient appears to be doing very well in regard to her left lower [employer]. Everything seems to be showing signs of improvement which is excellent news. Overall very pleased with the progress that has been made. Fortunately there's no evidence of active infection at this time also excellent news. 08/04/18 on evaluation today patient appears to be doing  more poorly in regard to her bilateral lower extremities. She has two new areas open up on the right and these were completely closed as of last week. She still  has the two spots on the left which in my pinion seem to be doing better. Fortunately there's no evidence of infection again at this point. 08/11/18 on evaluation today patient actually appears to be doing very well in regard to her bilateral lower Trinity wounds that all seem to be doing better and are measures smaller today. Fortunately there's no signs of infection. No fevers, chills, nausea, or vomiting noted at this time. 08/18/18 on evaluation today patient actually appears to be doing about the same inverter bilateral lower extremities. She continues to have areas that blistering open as was drain that fortunately nothing too significant. Overall I feel like Drawtex may have done better for her however compared to the collagen. 08/25/18 on evaluation today patient appears to be doing a little bit more poorly today even compared to last time I saw her. Again I'm not exactly sure why she's making worse progress over the past several weeks. I'm beginning to wonder if there is some kind of underlying low level infection causing this issue. I did actually take a culture from the left anterior lower extremity but it was a new wound draining quite a bit at this point. Unfortunately she also seems to be having more pain which is what also makes me worried about the possibility of infection. This is despite never erythema noted at this point. 09/01/18 on evaluation today patient actually appears to be doing a little worse even compared to last week in regard to bilateral lower extremities. She did go to the hospital on the 16th was given a dose of IV Zosyn and then discharged with a recommendation to continue with the Bactrim that I previously prescribed for her. Nonetheless she is still having a lot of discomfort she tells me as well at this  time. This is definitely unfortunate. No fevers, chills, nausea, or vomiting noted at this time. 09/08/18 on evaluation today patient's bilateral lower extremities actually appear to be shown signs of improvement which is good news. Fortunately there does not appear to be any signs of active infection I think the anabiotic is helping in this regard. Overall I'm very pleased with how she is progressing. 09/15/18 patient was actually seen in ER yesterday due to her legs as well unfortunately. She states she's been having a lot of pain and discomfort as well as a lot of drainage. Upon inspection today the patient does have a lot of swelling and drainage I feel like this is more related to lymphedema and poor fluid control than it is to infection based on what I'm seeing. The physician in the emergency department also doubted that the patient was having a significant infection nonetheless I see no evidence of infection obvious at this point although I do see evidence of poor fluid control. She still not using a compression pumps, she is not elevating due to her lift chair as well as her hospital bed being broken, and she really is not keeping her legs up as much as they should be and also has been taking off her wraps. All this combined I think has led to poor fluid control and to be honest she may be somewhat volume overloaded in general as well. I recommend that she may need to contact your physician to see if a prescription for a diuretic would be beneficial in their opinion. As long as this is safe I think it would likely help her. 09/29/18 on evaluation today patient's left lower extremity actually appears to be doing quite  a bit better. At least compared to last time that I saw her. She still has a large area where she is draining from but there's a lot of new skin speckled trout and in fact there's more new skin that there are open areas of weeping and drainage at this point. This is good news. With  regard to the right lower extremity this is doing much better with the only open area that I really see being a dry spot on the right lateral ankle currently. Fortunately there's no signs of active infection at this time which is good news. No fevers, chills, nausea, or vomiting noted at this time. The patient seems somewhat stressed and overwhelmed during the visit today she was very lethargic as such. She does and she is not taking any pain medications at this point. Apparently according to her husband are also in the process of moving which is probably taking its toll on her as well. 10/06/18 on evaluation today patient appears to be doing rather well in regard to her lower extremities compared to last evaluation. Fortunately there's no signs of active infection. She tells me she did have an appointment with her primary. Nonetheless he was concerned that the wounds were somewhat deep based on pictures but we never actually saw her legs. She states that he had her somewhat worried due to the fact that she was fearing now that she was [place] have to have an amputation. With that being said based on what I'm seeing check she looks better this week that she has the last two times I've seen her with much less drainage I'm actually pleased in this regard. That doesn't mean that she's out of the water but again I do not think what the point of talking about education at all in regard to her leg. She is very happy to hear this. She is also not having as much pain as she was having last week. 10/13/18 unfortunately on evaluation today patient still continues to have a significant amount of drainage she's not letting home health actually apply the compression dressings at this point. She's trying to use of Juxta-Lite of the top of Kerlex and the second layer of the three layer compression wrap. With that being said she just does not seem to be making as good a progress as I would expect if she was having the  compression applied and in place on a regular basis. No fevers, chills, nausea, or vomiting noted at this time. 10/20/18 on evaluation today patient appears to be doing a little better in regard to her bilateral lower extremity ulcers. In fact the right lower extremity seems to be healed she doesn't even have any openings at this point left lower extremity though still somewhat macerated seems to be showing signs of new skin growth at multiple locations throughout. Fortunately there's no evidence of active infection at this time. No fevers, chills, nausea, or vomiting noted at this time. 10/27/18 on evaluation today patient appears to be doing much better in regard to her left lower Trinity ulcer. She's been tolerating the laptop complication and has minimal drainage noted at this point. Fortunately there's no signs of active infection at this time. No fevers, chills, nausea, or vomiting noted at this time. 11/03/18 on evaluation today patient actually appears to be doing excellent in regard to her left lower extremity. She is having very little drainage at this point there does not appear to be any significant signs of infection overall very  pleased with how things have gone. She is likewise extremely pleased still and seems to be making wonderful progress week to week. I do believe antibiotics were helpful for her. Her primary care provider did place on amateur clean since I last saw her. 11/17/18 on evaluation today patient appears to be doing worse in regard to her bilateral lower extremities at this point. She is been tolerating the dressing changes without complication. With that being said she typically takes the Coban off fairly quickly upon arriving home even after being seen here in the clinic and does not allow home health reapply command as part of the dressing at home. Therefore she said no compression essentially since I last saw her as best I can tell. With that being said I think it  shows and how much swelling she has in the open wounds that are noted at this point. Fortunately there's no signs of infection but unfortunately if she doesn't get this under control I think she will end up with infection and more significant issues. 11/24/18 on evaluation today patient actually appears to be doing somewhat better in regard to her bilateral lower extremities. 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 Nonetheless that is doing better currently so she should be able to attempt reinitiating the lymphedema pumps at this time. No fevers, chills, nausea, or vomiting noted at this time. 12/01/18 upon evaluation today patient's left lower extremity appears to be doing quite well unfortunately her right lower extremity is not doing nearly as well. She has been tolerating the dressing changes without complication unfortunately she did not keep a wrap on the right at this time. Nonetheless I believe this has led to increased swelling and weeping in the world is actually much larger than during the last evaluation with her. 12/08/18 on evaluation today patient appears to be doing about the same at this point in regard to her right lower extremity. There is some more palatable to touch I'm concerned about the possibility of there being some infection although I think the main issue is she's not keeping her compression wrap on which in turn is not allowing this area to heal appropriately. 12/22/18 on evaluation today patient appears to be doing better in regard to left lower extremity unfortunately significantly worse in regard to the right lower extremity. The areas of blistering and necrotic superficial tissue have spread and again this does not really appear to be signs of infection and all she just doesn't seem to be doing nearly as well is what she has  been in the past. Overall I feel like the Augmentin did absolutely nothing for her she doesn't seem to have any infection again I really didn't think so last time either is more of a potential preventative measure and hoping that this would make some difference but I think the main issue is she's not wearing her compression. She tells me she cannot wear the [place] been we put on she takes it off pretty much upon getting home. Subsequently she worshiped Juxta-Lite when I questioned her about how often she wears it this is no more than three hours a day obviously that leaves 21 hours that she has no compression and this is obviously not doing well for her. Overall I'm concerned that if things continue to worsen she is  at great risk of both infection as well as losing her leg. 01/05/19 on evaluation today patient appears to be doing well in regard to her left lower extremity which he is allowing [place] to wrap and not so well with regard to her right lower extremity which she is not allowing [place] to really wrap and keep the wrap on. She states that it hurts too badly whenever it's wrapped and she ends up having to take it off. She's been using the Juxta-Lite she tells me up to six hours a day although I question whether or not that's really been the case to be honest. Previously she told me three hours today nonetheless obviously the legs as long as the wrap is doing great when she is not is doing much more poorly. 01/12/2019 on evaluation today patient actually appears to be doing a little better in my opinion with regard to her right lower extremity ulcer. She has a small open area on the left lower extremity unfortunately but again this I think is part of the normal fluctuation of what she is going to have to expect with regard to her legs especially when she is not using her lymphedema pumps on a regular basis. Subsequently based on what I am seeing today I think that she does seem to be doing slightly  better with regard to her right lower extremity she did see her primary care provider on Monday they felt she had an infection and placed her on 2 antibiotics. Both Cipro and clindamycin. Subsequently again she seems possibly to be doing a little bit better in regards to the right lower extremity she also tells me however she has been wearing the compression wrap over the past week since I spoke with her as well that is a Kerlix and Coban wrap on the right. No fevers, chills, nausea, vomiting, or diarrhea. 01/19/2019 on evaluation today patient appears to be doing better with regard to her bilateral lower extremities especially the right. I feel like the compression has been beneficial for her which is great news. She did get a call from her primary care provider on her way here today telling her that she did have methicillin-resistant Staphylococcus aureus and he was calling in a couple new antibiotics for her including a ointment to be applied she tells me 3 times a day. With that being said this sounds like likely to be Bactroban which I think could be applied with each dressing/wrap change but I would not be able to accommodate her applying this 3 times a day. She is in agreement with the least doing this we will add that to her orders today. 01/26/2019 on evaluation today patient actually appears to be doing much better with regard to her right lower extremity. Her left lower extremity is also doing quite well all things considering. Fortunately there is no evidence of active infection at this time. No fevers, chills, nausea, vomiting, or diarrhea. 02/02/2019 on evaluation today patient appears to be doing much better compared to her last evaluation. Little by little off like her right leg is returning more towards normal. There does not appear to be any signs of active infection and overall she seems to be doing quite well which is great news. I am very pleased in this regard. No fevers,  chills, nausea, vomiting, or diarrhea. 02/09/2019 upon evaluation today patient appears to be doing better with regard to her bilateral lower extremities. She has been tolerating the dressing changes without complication. Fortunately  there is no signs of active infection at this time. No fevers, chills, nausea, vomiting, or diarrhea. 02/23/2019 on evaluation today patient actually appears to be doing quite well with regard to her bilateral lower extremities. She has been tolerating the dressing changes without complication. She is even used her pumps one time and states that she really felt like it felt good. With that being said she seems to be in good spirits and her legs appear to be doing excellent. 03/09/2019 on evaluation today patient appears to be doing well with regard to her right lower extremity there are no open wounds at this time she is having some discomfort but I feel like this is more neuropathy than anything. With regard to her left lower extremity she had several areas scattered around that she does have some weeping and drainage from but again overall she does not appear to be having any significant issues and no evidence of infection at this time which is good news. 03/23/2019 on evaluation today patient appears to be doing well with regard to her right lower extremity which she tells me is still close she is using her juxta light here. Her left lower extremity she mainly just has an area on the foot which is still slightly draining although this also is doing great. Overall very pleased at this time. 04/06/2019 patient appears to be doing a little bit worse in regard to her left lower extremity upon evaluation today. She feels like this could be becoming infected again which she had issues with previous. Fortunately there is no signs of systemic infection but again this is always a struggle with her with her legs she will go from doing well to not so well in a very short amount  of time. 04/20/2019 on evaluation today patient actually appears to be doing quite well with regard to her right lower extremity I do not see any signs of active infection at this time. Fortunately there is no fever chills noted. She is still taking the antibiotics which I prescribed for her at this point. In regard to the left lower extremity I do feel like some of these areas are better although again she still is having weeping from several locations at this time. Patient History Information obtained from Patient. Family History Cancer - Siblings, Hypertension - Siblings, Stroke - Father, No family history of Diabetes, Heart Disease, Hereditary Spherocytosis, Kidney Disease, Lung Disease, Seizures, Thyroid Problems, Tuberculosis. Social History Never smoker, Marital Status - Married, Alcohol Use - Never, Drug Use - No History, Caffeine Use - Never. Medical History Eyes Denies history of Cataracts, Glaucoma, Optic Neuritis Ear/Nose/Mouth/Throat Denies history of Chronic sinus problems/congestion, Middle ear problems Hematologic/Lymphatic Denies history of Anemia, Hemophilia, Human Immunodeficiency Virus, Lymphedema, Sickle Cell Disease Respiratory Patient has history of Asthma Denies history of Aspiration, Chronic Obstructive Pulmonary Disease (COPD), Pneumothorax, Sleep Apnea, Tuberculosis Cardiovascular Patient has history of Hypertension, Peripheral Arterial Disease, Peripheral Venous Disease - chronic Denies history of Angina, Arrhythmia, Congestive Heart Failure, Coronary Artery Disease, Deep Vein Thrombosis, Hypotension, Myocardial Infarction, Phlebitis, Vasculitis Endocrine Patient has history of Type II Diabetes Genitourinary Denies history of End Stage Renal Disease Immunological Denies history of Lupus Erythematosus, Raynaudoos, Scleroderma Musculoskeletal Patient has history of Gout, Osteoarthritis Denies history of Rheumatoid Arthritis,  Osteomyelitis Neurologic Denies history of Dementia, Neuropathy, Quadriplegia, Paraplegia, Seizure Disorder Psychiatric Denies history of Anorexia/bulimia, Confinement Anxiety Hospitalization/Surgery History - leg cellulitis. Medical And Surgical History Notes Constitutional Symptoms (General Health) morbid obesity, wheelchair dependent Integumentary (Skin)  LLL cellulitis , wound cx grew E. Coli Psychiatric insomnia , adjustment disorder, depression with anxiety Review of Systems (ROS) Constitutional Symptoms (General Health) Denies complaints or symptoms of Fatigue, Fever, Chills, Marked Weight Change. Respiratory Denies complaints or symptoms of Chronic or frequent coughs, Shortness of Breath. Cardiovascular Denies complaints or symptoms of Chest pain. Psychiatric Denies complaints or symptoms of Claustrophobia, Suicidal. Objective Constitutional Well-nourished and well-hydrated in no acute distress. Vitals Time Taken: 10:32 AM, Height: 62 in, Weight: 335 lbs, BMI: 61.3, Temperature: 98.3 F, Pulse: 114 bpm, Respiratory Rate: 20 breaths/min, Blood Pressure: 156/94 mmHg. Respiratory normal breathing without difficulty. clear to auscultation bilaterally. Cardiovascular regular rate and rhythm with normal S1, S2. Psychiatric this patient is able to make decisions and demonstrates good insight into disease process. Alert and Oriented x 3. pleasant and cooperative. General Notes: Patient's wound bed currently showed signs of good granulation at this point at most locations with good epithelization as well she does still have weeping at several spots but fortunately nothing too significant. I do believe the gentamicin along with the alginate has been beneficial for her. Integumentary (Hair, Skin) Wound #57 status is Open. Original cause of wound was Gradually Appeared. The wound is located on the Left,Dorsal Foot. The wound measures 3.2cm length x 3cm width x 0.1cm depth; 7.54cm^2  area and 0.754cm^3 volume. There is Fat Layer (Subcutaneous Tissue) Exposed exposed. There is no tunneling or undermining noted. There is a small amount of serosanguineous drainage noted. The wound margin is distinct with the outline attached to the wound base. There is medium (34-66%) pink, pale granulation within the wound bed. There is a medium (34- 66%) amount of necrotic tissue within the wound bed including Adherent Slough. Wound #58 status is Open. Original cause of wound was Blister. The wound is located on the Left,Medial Ankle. The wound measures 1.8cm length x 3.6cm width x 0.1cm depth; 5.089cm^2 area and 0.509cm^3 volume. There is Fat Layer (Subcutaneous Tissue) Exposed exposed. There is no tunneling or undermining noted. There is a small amount of serosanguineous drainage noted. The wound margin is distinct with the outline attached to the wound base. There is large (67-100%) pink granulation within the wound bed. There is a small (1-33%) amount of necrotic tissue within the wound bed including Adherent Slough. Wound #59 status is Open. Original cause of wound was Gradually Appeared. The wound is located on the Left,Medial Lower Leg. The wound measures 1.6cm length x 1cm width x 0.1cm depth; 1.257cm^2 area and 0.126cm^3 volume. There is no tunneling or undermining noted. There is a medium amount of serosanguineous drainage noted. The wound margin is distinct with the outline attached to the wound base. There is large (67-100%) red granulation within the wound bed. There is no necrotic tissue within the wound bed. Wound #60 status is Open. Original cause of wound was Gradually Appeared. The wound is located on the Left,Posterior Lower Leg. The wound measures 4.8cm length x 4.5cm width x 0.1cm depth; 16.965cm^2 area and 1.696cm^3 volume. There is no tunneling or undermining noted. There is a medium amount of serosanguineous drainage noted. The wound margin is distinct with the outline  attached to the wound base. There is large (67-100%) red, pink granulation within the wound bed. There is a small (1-33%) amount of necrotic tissue within the wound bed including Adherent Slough. Wound #61 status is Open. Original cause of wound was Gradually Appeared. The wound is located on the Left,Anterior Lower Leg. The wound measures 6cm length x 1.6cm  width x 0.1cm depth; 7.54cm^2 area and 0.754cm^3 volume. There is Fat Layer (Subcutaneous Tissue) Exposed exposed. There is no tunneling or undermining noted. There is a medium amount of serosanguineous drainage noted. The wound margin is flat and intact. There is large (67-100%) pink granulation within the wound bed. There is no necrotic tissue within the wound bed. Assessment Active Problems ICD-10 Type 2 diabetes mellitus with other skin ulcer Lymphedema, not elsewhere classified Chronic venous hypertension (idiopathic) with ulcer and inflammation of right lower extremity Chronic venous hypertension (idiopathic) with ulcer and inflammation of left lower extremity 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 leg with fat layer exposed Non-pressure chronic ulcer of other part of left lower leg with fat layer exposed Essential (primary) hypertension Morbid (severe) obesity due to excess calories Other specified anxiety disorders Weakness Procedures Wound #58 Pre-procedure diagnosis of Wound #58 is a Diabetic Wound/Ulcer of the Lower Extremity located on the Left,Medial Ankle . There was a Three Layer Compression Therapy Procedure by Shawn Stall, RN. Post procedure Diagnosis Wound #58: Same as Pre-Procedure Plan Follow-up Appointments: Return Appointment in 1 week. Dressing Change Frequency: Wound #57 Left,Dorsal Foot: Change dressing three times week. Wound Cleansing: Wound #57 Left,Dorsal Foot: May shower with protection. - left leg Primary Wound Dressing: Wound #57 Left,Dorsal Foot: Calcium Alginate with  Silver Other: - Gentamicin ointment to wound bed under alginate (use Mupirocin in clinic) Wound #58 Left,Medial Ankle: Calcium Alginate with Silver Other: - Gentamicin ointment to wound bed under alginate (use Mupirocin in clinic) Wound #59 Left,Medial Lower Leg: Calcium Alginate with Silver Other: - Gentamicin ointment to wound bed under alginate (use Mupirocin in clinic) Wound #60 Left,Posterior Lower Leg: Calcium Alginate with Silver Other: - Gentamicin ointment to wound bed under alginate (use Mupirocin in clinic) Wound #61 Left,Anterior Lower Leg: Calcium Alginate with Silver Other: - Gentamicin ointment to wound bed under alginate (use Mupirocin in clinic) Secondary Dressing: Dry Gauze - all wounds ABD pad - as needed Edema Control: 3 Layer Compression System - Left Lower Extremity Avoid standing for long periods of time - walking is encouraged Elevate legs to the level of the heart or above for 30 minutes daily and/or when sitting, a frequency of: - do not sleep in chair with feet dangling Exercise regularly Support Garment 20-30 mm/Hg pressure to: - Juxtalite to right lower leg daily Segmental Compressive Device. - lymphedema pumps 60 minutes 1- 2 times per day Additional Orders / Instructions: Follow Nutritious Diet - To include vitamin A, vitamin C, and Zinc along with increased protein intake. Home Health: Continue Home Health skilled nursing for wound care. - [facility] I would recommend currently that we continue with the gentamicin underneath the silver alginate dressing to left lower extremity and she is in agreement with the plan. 2. I am also can recommend that we go ahead and continue with the compression wrap which is a 3 layer compression wrap for the left lower extremity. 3. She will continue with the juxta lite for the right lower extremity at this point. 4. I do recommend she continue to use her lymphedema pumps although I really do not think she uses them at  all at this point also think she should be elevating her legs on a regular basis. We will see patient back for reevaluation in 1 week here in the clinic. If anything worsens or changes patient will contact our office for additional recommendations. Electronic Signature(s) Signed: 04/20/2019 6:48:11 PM By: Lenda Kelp PA-C  Entered By: Lenda Kelp on 04/20/2019 11:37:37 -------------------------------------------------------------------------------- HxROS Details Patient Name: Date of Service: NYLAN, NAKATANI 04/20/2019 9:30 AM Medical Record [id number] Patient Account Number: [id number] Date of Birth/Sex: Treating RN: [birth year] (68 y.o. [name] Primary Care Provider: Fatima Sanger Other Clinician: Referring Provider: Treating Provider/Extender:Stone III, Flo Shanks, FRED Weeks in Treatment: 55 Information Obtained From Patient Constitutional Symptoms (General Health) Complaints and Symptoms: Negative for: Fatigue; Fever; Chills; Marked Weight Change Medical History: Past Medical History Notes: morbid obesity, wheelchair dependent Respiratory Complaints and Symptoms: Negative for: Chronic or frequent coughs; Shortness of Breath Medical History: Positive for: Asthma Negative for: Aspiration; Chronic Obstructive Pulmonary Disease (COPD); Pneumothorax; Sleep Apnea; Tuberculosis Cardiovascular Complaints and Symptoms: Negative for: Chest pain Medical History: Positive for: Hypertension; Peripheral Arterial Disease; Peripheral Venous Disease - chronic Negative for: Angina; Arrhythmia; Congestive Heart Failure; Coronary Artery Disease; Deep Vein Thrombosis; Hypotension; Myocardial Infarction; Phlebitis; Vasculitis Psychiatric Complaints and Symptoms: Negative for: Claustrophobia; Suicidal Medical History: Negative for: Anorexia/bulimia; Confinement Anxiety Past Medical History Notes: insomnia , adjustment disorder, depression with anxiety Eyes Medical  History: Negative for: Cataracts; Glaucoma; Optic Neuritis Ear/Nose/Mouth/Throat Medical History: Negative for: Chronic sinus problems/congestion; Middle ear problems Hematologic/Lymphatic Medical History: Negative for: Anemia; Hemophilia; Human Immunodeficiency Virus; Lymphedema; Sickle Cell Disease Endocrine Medical History: Positive for: Type II Diabetes Time with diabetes: 2005 Treated with: Insulin Blood sugar tested every day: No Blood sugar testing results: Breakfast: 155; Lunch: 155; Dinner: 160; Bedtime: 155 Genitourinary Medical History: Negative for: End Stage Renal Disease Immunological Medical History: Negative for: Lupus Erythematosus; Raynauds; Scleroderma Integumentary (Skin) Medical History: Past Medical History Notes: LLL cellulitis , wound cx grew E. Coli Musculoskeletal Medical History: Positive for: Gout; Osteoarthritis Negative for: Rheumatoid Arthritis; Osteomyelitis Neurologic Medical History: Negative for: Dementia; Neuropathy; Quadriplegia; Paraplegia; Seizure Disorder Immunizations Pneumococcal Vaccine: Received Pneumococcal Vaccination: Yes Implantable Devices No devices added Hospitalization / Surgery History Type of Hospitalization/Surgery leg cellulitis Family and Social History Cancer: Yes - Siblings; Diabetes: No; Heart Disease: No; Hereditary Spherocytosis: No; Hypertension: Yes - Siblings; Kidney Disease: No; Lung Disease: No; Seizures: No; Stroke: Yes - Father; Thyroid Problems: No; Tuberculosis: No; Never smoker; Marital Status - Married; Alcohol Use: Never; Drug Use: No History; Caffeine Use: Never; Financial Concerns: No; Food, Clothing or Shelter Needs: No; Support System Lacking: No; Transportation Concerns: No Physician Affirmation I have reviewed and agree with the above information. Electronic Signature(s) Signed: 04/20/2019 6:42:58 PM By: Zenaida Deed RN, BSN Signed: 04/20/2019 6:48:11 PM By: Lenda Kelp  PA-C Entered By: Lenda Kelp on 04/20/2019 11:35:59 -------------------------------------------------------------------------------- SuperBill Details Patient Name: Date of Service: [name] 04/20/2019 Medical Record [id number] Patient Account Number: [id number] Date of Birth/Sex: Treating RN: 1950/08/17 (67 y.o. [name] Primary Care Provider: Fatima Sanger Other Clinician: Referring Provider: Treating Provider/Extender:Stone III, Flo Shanks, FRED Weeks in Treatment: 29 Diagnosis Coding ICD-10 Codes Code Description E11.622 Type 2 diabetes mellitus with other skin ulcer I89.0 Lymphedema, not elsewhere classified I87.331 Chronic venous hypertension (idiopathic) with ulcer and inflammation of right lower extremity I87.332 Chronic venous hypertension (idiopathic) with ulcer and inflammation of left lower extremity L97.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 leg with fat layer exposed L97.822 Non-pressure chronic ulcer of other part of left lower leg with fat layer exposed I10 Essential (primary) hypertension E66.01 Morbid (severe) obesity due to excess calories F41.8 Other specified anxiety disorders R53.1 Weakness Facility Procedures CPT4 Code Description: 57846962 (Facility Use Only) 29581LT - APPLY MULTLAY COMPRS LWR LT LEG Modifier:  Quantity: 1 Physician Procedures CPT4 Code Description: 1610960 [id number] - WC PHYS LEVEL 4 - EST PT ICD-10 Diagnosis Description E11.622 Type 2 diabetes mellitus with other skin ulcer I89.0 Lymphedema, not elsewhere classified I87.331 Chronic venous hypertension (idiopathic) with ulcer and  lower extremity I87.332 Chronic venous hypertension (idiopathic) with ulcer and extremity Modifier: inflammation of inflammation of Quantity: 1 right left lower Electronic Signature(s) Signed: 04/20/2019 6:48:11 PM By: Lenda Kelp PA-C Entered By: Lenda Kelp on 04/20/2019 11:38:06

## 2019-04-27 ENCOUNTER — Encounter (HOSPITAL_BASED_OUTPATIENT_CLINIC_OR_DEPARTMENT_OTHER): Payer: Medicare PPO | Admitting: Physician Assistant

## 2019-04-27 ENCOUNTER — Other Ambulatory Visit: Payer: Self-pay

## 2019-04-27 DIAGNOSIS — E11622 Type 2 diabetes mellitus with other skin ulcer: Secondary | ICD-10-CM | POA: Diagnosis not present

## 2019-04-27 NOTE — Progress Notes (Signed)
[name], [name] ([id number]) Visit Report for 04/27/2019 Chief Complaint Document Details Patient Name: Date of Service: [name], [name] 04/27/2019 9:30 AM Medical Record [id number] Patient Account Number: [id number] Date of Birth/Sex: Treating RN: 29-Dec-1950 (68 y.o. [name] Primary Care Provider: Fatima Sanger Other Clinician: Referring Provider: Treating Provider/Extender:Stone III, Flo Shanks, FRED Weeks in Treatment: 44 Information Obtained from: Patient Chief Complaint Bilateral reoccurring LE ulcers Electronic Signature(s) Signed: 04/27/2019 5:02:14 PM By: Lenda Kelp PA-C Entered By: Lenda Kelp on 04/27/2019 09:34:56 -------------------------------------------------------------------------------- HPI Details Patient Name: Date of Service: [name]. 04/27/2019 9:30 AM Medical Record [id number] Patient Account Number: [id number] Date of Birth/Sex: Treating RN: 10-Nov-1950 (68 y.o. [name] Primary Care Provider: Fatima Sanger Other Clinician: Referring Provider: Treating Provider/Extender:Stone III, Flo Shanks, FRED Weeks in Treatment: 99 History of Present Illness HPI Description: this patient has been seen a couple of times before and returns with recurrent problems to her right and left lower extremity with swelling and weeping ulcerations due to not wearing her compression stockings which she had been advised to do during her last discharge, at the end of June 2018. During her last visit the patient had had normal arterial blood flow and her venous reflux study did not necessitate any surgical intervention. She was recommended compression and elevation and wound care. After prolonged treatment the patient was completely healed but she has been noncompliant with wearing or compressions.. She was here last week with an outpatient return visit planned but the patient came in a very poor general condition with altered  mental status and was rushed to the ER on my request. With a history of hypertension, diabetes, TIA and right-sided weakness she was set up for an MRI on her brain and cervical spine and was sent to [facility]. Getting an MRI done was very difficult but once the workup was done she was found not to have any spinal stenosis, epidural abscess or hematoma or discitis. This was radiculopathy to be treated as an outpatient and she was given a follow-up appointment. Today she is feeling much better alert and oriented and has come to reevaluate her bilateral lower extremity lymphedema and ulceration 03/25/2017 -- she was admitted to the hospital on 03/16/2017 and discharged on 03/18/2017 with left leg cellulitis and ulceration. She was started on vancomycin and Zosyn and x-ray showed no bony involvement. She was treated for a cellulitis with IV antibiotics changed to Rocephin and Flagyl and was discharged on oral Keflex and doxycycline to complete a 7 day course. Last hemoglobin A1c was 7.1 and her other ailments including hypertension got asthma were appropriately treated. 05/06/2017 -- she is awaiting the right size of compression stockings from [employer] but other than that has been doing well. ====== Old notes 68 year old patient was seen one time last October and was lost to follow-up. She has recurrent problems with weeping and ulceration of her left lower extremity and has swelling of this for several years. It has been worse for the last 2 months. Past medical history is significant for diabetes mellitus type 2, hypertension, gout, morbid obesity, depressive disorders, hiatal hernia, migraines, status post knee surgery, risk of a cholecystectomy, vaginal hysterectomy and breast biopsy. She is not a smoker. As noted before she has never had a venous duplex study and an arterial ABI study was attempted but the left lower extremity was noncompressible 10/01/2016 -- had a lower extremity venous  duplex reflux evaluation which showed no evidence of deep vein reflux  in the right or left lower extremity, and no evidence of great saphenous vein reflux more than 500 ms in the right or left lower extremity, and the left small saphenous vein is incompetent but no vascular consult was recommended. review of her electronic medical records noted that the ABI was checked in July 2017 where the right ABI was normal limits and the left ABI could not be ascertained due to pain with cuff pressure but the waveforms are within normal limits. her arterial duplex study scheduled for April 27. 10/08/2016 -- the patient has various reasons for not having a compression on and for the last 3 days she has had no compression on her left lower extremity either due to pain or the lack of nursing help. She does not use her juxta lites either. 10/15/2016 -- the patient did not keep her appointment for arterial duplex study on April 27 and I have asked her to reschedule this. Her pain is out of proportion with the physical findings and she continuously fails to wear a compression wraps and cuts them off because she says she cannot tolerate the pain. She does not use her juxta lites either. 10/22/2016 -- he has rescheduled her arterial duplex study to May 21 and her pain today is a bit better. She has not been wearing her juxta lites on her right lower extremity but now understands that she needs to do this. She did tolerate the to press compression wrap on her left lower extremity 10/29/2016 --arterial duplex study is scheduled for next week and overall she has been tolerating her compression wraps and also using her juxta lites on her right lower extremity 11/05/2016 -- the right ABI was 0.95 the left was 1.03. The digit TBI is on the right was 0.83 on the left was 0.92 and she had biphasic flow through these vessels. The impression was that of normal lower extremity arterial study. 11/12/2016 -- her pain is  minimal and she is doing very well overall. 11/26/2016 -- she has got juxta lites and her insurance will not pay for additional dual layer compression stockings. She is going to order some from [facility]. 05/12/2017 -- her juxta lites are very old and too big for her and these have not been helping with compression. She did get 20-30 mm compression stockings from [facility] but she and her husband are unable to put these on. I believe she will benefit from bilateral Extremit-ease, compression stockings and we will measure her for these today. 05/20/2017 -- lymphedema on the left lower extremity has increased a lot and she has a open ulceration as a result of this. The right lower extremity is looking pretty good. She has decided to by the compression stockings herself and will get reimbursed by the home health, at a later date. 05/27/2017 -- her sciatica is bothering her a lot and she thought her left leg pain was caused due to the compression wrap and hence removed it and has significant lymphedema. There is no inflammation on this left lower extremity. 06/17/17 on evaluation today patient appears to be doing very well and in fact is completely healed in regard to her ulcerations. Unfortunately however she does have continued issues with lymphedema nonetheless. We did order compression garments for her unfortunately she states that the size that she received were large although we ordered medium. Obviously this means she is not getting the optimal compression. She does not have those with her today and therefore we could not confirm and contact  the company on her behalf. Nonetheless she does state that she is going to have her husband bring them by tomorrow so that we can verify and then get in touch with the company. No fevers, chills, nausea, or vomiting noted at this time. Overall patient is doing better otherwise and I'm pleased with the progress she has made. 07/01/17 on evaluation today patient  appears to be doing very well in regard to her bilateral lower extremity she does not have any openings at this point which is excellent news. Overall I'm pleased with how things have progressed up to this time. Since she is doing so well we did order her compression which we are seeing her today to ensure that it fits her properly and everything is doing well in that regard and then subsequently she will be discharged. ============ Old Notes: 03/31/16 patient presents today for evaluation concerning open wounds that she has over the left medial ankle region as well as the left dorsal foot. She has previously had this occur although it has been healed for a number of months after having this for about a year prior until her hospitalization on 01/05/16. At that point in time it appears that she was admitted to the hospital for left lower extremity cellulitis and was placed on vancomycin and Zosyn at that point. Eventually upon discharge on January 15, 2016 she was placed on doxycycline at that point in time. Later on 03/27/16 positive wound culture growing Escherichia coli this was switched to amoxicillin. Currently she tells me that she is having pain radiated to be a 7 out of 10 which can be as high as 10 out of 10 with palpation and manipulation of the wound. This wound appears to be mainly venous in nature due to the bilateral lower extremity venous stasis/lymphedema. This is definitely much worse on her left than the right side. She does have type 1 diabetes mellitus, hypertension, morbid obesity, and is wheelchair dependent.during the course of the hospital stay a blood culture was also obtained and fortunately appeared negative. She also had an x-ray of the tibia/fibula on the left which showed no acute bone abnormality. Her white blood cell count which was performed last on 03/25/16 was 7.3, hemoglobin 12.8, protein 7.1, albumin 3.0. Her urine culture appeared to be negative for any specific  organisms. Patient did have a left lower extremity venous duplex evaluation for DVT. This did not include venous reflux studies but fortunately was negative for DVT. Patient also had arterial studies performed which revealed that she had a normal ABI on the right though this was unable to be performed on the left secondary to pain that she was having around the ankle region due to the wound. However it was stated on report that she had biphasic pulses and apparently good blood flow. ========== 06/03/17 she is here in follow-up evaluation for right lower extremity ulcer. The right lower sure he has healed but she has reopened to the left medial malleolus and dorsal foot with weeping. She is waiting for new compression garments to arrive from home health, the previous compression garments were ill fitting. We will continue with compression bilaterally and follow-up in 2 weeks Readmission: 08/05/17 on evaluation today patient appears to be doing somewhat poorly in regard to her left lower extremity especially although the right lower extremity has a small area which may no longer be open. She has been having a lot of drainage from the left lower extremity however he tells me that  she has not been able to use the EXTREMIT- EASE Compression at this point. She states that she did better and was able to actually apply the Juxta-Lite compression although the wound that she has is too large and therefore really does not compress which is why she cannot wear it at this point. She has no one who can help her put it on regular basis her son can sometimes but he's not able to do it most of the time. I do believe that's why she has begun to weave and have issues as she is currently yet again. No fevers, chills, nausea, or vomiting noted at this time. Patient is no evidence of dementia. 08/12/17 on evaluation today patient appears to still be doing fairly well in regard to the draining areas/weeping areas at  this point. With that being said she unfortunately did go to the ER yesterday due to what was felt to be possibly a cellulitis. They place her on doxycycline by mouth and discharge her home. She definitely was not admitted. With that being said she states she has had more discomfort which has been unusual for her even compared to prior times and she's had infections.08/12/17 on evaluation today patient appears to still be doing fairly well in regard to the draining areas/weeping areas at this point. With that being said she unfortunately did go to the ER yesterday due to what was felt to be possibly a cellulitis. They place her on doxycycline by mouth and discharge her home. She definitely was not admitted. With that being said she states she has had more discomfort which has been unusual for her even compared to prior times and she's had infections. 08/19/17 put evaluation today patient tells me that she's been having a lot of what sounds to be neuropathic type pain in regard to her left lower extremity. She has been using over-the-counter topical bins again which some believe. That in order to apply the she actually remove the wrap we put on her last Wednesday on Thursday. Subsequently she has not had anything on compression wise since that time. The good news is a lot of the weeping areas appear to have closed at this point again I believe she would do better with compression but we are struggling to get her to actually use what she needs to at this point. No fevers, chills, nausea, or vomiting noted at this time. 09/03/17 on evaluation today patient appears to be doing okay in regard to her lower extremities in regard to the lymphedema and weeping. Fortunately she does not seem to show any signs of infection at this point she does have a little bit of weeping occurring in the right medial malleolus area. With that being said this does not appear to be too significant which is good news. 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 She has severe skin damage secondary to both of these features involving the dorsal left foot and medial left ankle and lower leg. Still has open areas in the left anterior foot. The area on the right closed over. She uses her own juxta light stockings. She does not have an arterial issue 09/16/17 on evaluation today patient actually appears to be doing excellent in regard to her bilateral lower extremity swelling. The Juxta-Lite compression wrap seem to be doing very well for her. She has not however been using the portion that goes over her foot. Her left foot still is draining a  little bit not nearly as significant as it has been in the past but still I do believe that she likely needs to utilize the full wrap including the foot portion of this will improve as well. She also has been apparently putting on a significant amount of Vaseline which also think is not helpful for her. I recommended that if she feels she needs something for moisturizer Eucerin will probably be better. 09/30/17 on evaluation today patient presents with several new open areas in regard to her left lower extremity although these appear to be minimal and mainly seem to be more moisture breakdown than anything. Fortunately she does not seem to have any evidence of infection which is great news. She has been tolerating the dressing changes without complication we are using silver alginate on the foot she has been using AB pads to have the legs and using her Juxta-Lite compression which seems to be controlling her swelling very well. Overall I'm pleased with the poor way she has progressed. 10/14/17 on evaluation today patient appears to be doing better in regard to her left lower extremity areas of weeping. She does still have some discomfort although in general this does not appear to be as macerated and I think it  is progressing nicely. I do think she still needs to wear the foot portion of her Juxta-Lite in order to get the most benefit from the wrap obviously. She states she understands. Fortunately there does not appear to be evidence of infection at this time which is great news. 10/28/17 on evaluation today patient appears to be doing excellent in regard to her left lower extremity. She has just a couple areas that are still open and seem to be causing any trouble whatsoever. For that reason I think that she is definitely headed in the right direction the spots are very tiny compared to what we have been dealing with in the past. 11/11/17 on evaluation today patient appears to have a right lateral lower extremity ulcer that has opened since I last saw her. She states this is where the home health nurse that was coming out remove the dressing without wetting the alginate first. Nonetheless I do not know if this is indeed the case or not but more importantly we have not ordered home help to be coming out for her wounds at all. I'm unsure as to why they are coming out and we're gonna have to check on this and get things situated in that regard. With that being said we currently really do not need them to be coming out as the patient has been taking care of her leg herself without complication and no issues. In fact she was doing much better prior to nursing coming out. 11/25/17 on evaluation today patient actually appears to be doing fairly well in regard to her left lower extremity swelling. In fact she has very little area of weeping at this point there's just a small spot on the lateral portion of her right leg that still has me just a little bit more concerned as far as wanting to see this clear up before I discharge her to caring for this at home. Nonetheless overall she has made excellent progress. 12/09/17 on evaluation today patient appears to be doing rather well in regard to her lower extremity  edema. She does have some weeping still in the left lower extremity although the big area we were taking care of two weeks ago actually has closed and she has another  area of weeping on the left lower extremity immediately as well is the top of her foot. She does not currently have lymphedema pumps she has been wearing her compression daily on a regular basis as directed. With that being said I think she may benefit from lymphedema pumps. 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 We had a very difficult time getting and keeping this under control. 12/23/17 on evaluation today patient actually appears to be doing a little bit more poorly in regard to her bilateral lower extremities. She has been tolerating the Juxta-Lite compression wraps. Unfortunately she has two new ulcers on the right lower extremity and left lower Trinity ulceration seems to be larger. Obviously this is not good news. She has been tolerating the dressings without complication. 12/30/17 on evaluation today patient actually appears to be doing much better in regard to her bilateral lower extremity edema. She continues to have some issues with ulcerations and in fact there appears to be one spot on each leg where the wrap may have caused a little bit of a blister which is subsequently opened up at this point is given her pain. Fortunately it does not appear to be any evidence of infection which is good news. No fevers chills noted. 01/13/18 on evaluation today patient appears to be doing rather well in regard to her bilateral lower extremities. The dressings did get kind of stuck as far as the wound beds are concerned but again I think this is mainly due to the fact that she actually seems to be showing signs of healing which is good news. She's not having as much drainage  therefore she was having more of the dressing sticking. Nonetheless overall I feel like her swelling is dramatically down compared to previous. 01/20/18 on evaluation today patient unfortunately though she's doing better in most regards has a large blister on the left anterior lower extremity where she is draining quite significantly. Subsequently this is going to need debridement today in order to see what's underneath and ensure she does not continue to trapping fluid at this location. Nonetheless No fevers, chills, nausea, or vomiting noted at this time. 01/27/18 on evaluation today patient appears to be doing rather well at this point in regard to her right lower extremity there's just a very small area that she still has open at this point. With that being said I do believe that she is tolerating the compression wraps very well in making good progress. Home health is coming out at this point to see her. Her left lower extremity on the lateral portion is actually what still mainly open and causing her some discomfort for the most part 02/10/18 on evaluation today patient actually appears to be doing very well in regard to her right lower extremity were all the ulcers appear to be completely close. In regard to the left lower extremity she does have two areas still open and some leaking from the dorsal surface of her foot but this still seems to be doing much better to me in general. 02/24/18 on evaluation today patient actually appears to be doing much better in regard to her right lower extremity this is still completely healed. Her left lower extremity is also doing much better fortunately she has no evidence of infection. The one area that is gonna require some debridement is still  on the left anterior shin. Fortunately this is not hurting her as badly today. 03/10/18 on evaluation today patient appears to be doing better in some regards although she has a little bit more open area on the dorsal  foot and she also has some issues on the medial portion of the left lower extremity which is actually new and somewhat deep. With that being said there fortunately does not appear to be any significant signs of infection which is good news. No fevers, chills, nausea, or vomiting noted at this time. In general her swelling seems to be doing fairly well which is good news. 03/31/18 on evaluation today patient presents for follow-up concerning her left lower extremity lymphedema. Unfortunately she has been doing a little bit more poorly since I last saw her in regard to the amount of weeping that she is experiencing. She's also having some increased pain in the anterior shin location. Unfortunately I do not feel like the patient is making such good progress at this point a few weeks back she was definitely doing much better. 04/07/18 on evaluation today patient actually appears to be showing some signs of improvement as far as the left lower extremity is concerned. She has been tolerating the dressing changes and it does appear that the Drawtex did better for her. With that being said unfortunately home health is stating that they cannot obtain the Drawtex going forward. Nonetheless we're gonna have to check and see what they may be able to get the alginate they were using was getting stuck in causing new areas of skin being pulled all that with and subsequently weep and calls her to worsen overall this is the first time we've seen improvement at this time. 04/14/18 on evaluation today patient actually appears to be doing rather well at this point there does not appear to be any evidence of infection at this time and she is actually doing excellent in regard to the weeping in fact she almost has no openings remaining even compared to just last week this is a dramatic improvement. No fevers chills noted 04/21/18 evaluation today patient actually appears to be doing very well. She in fact is has a small  area on the posterior lower extremity location and she has a small area on the dorsal surface of her foot that are still open both of which are very close to closing. We're hoping this will be close shortly. She brought her Juxta-Lite wrap with her today hoping that would be able to put her in it unfortunately I don't think were quite at that point yet but we're getting closer. 04/28/18 upon evaluation today patient actually appears to be doing excellent in regard to her left lower extremity ulcer. In fact the region on the posterior lower extremity actually is much smaller than previously noted. Overall I'm very happy with the progress she has made. She again did bring her Juxta-Lite although we're not quite ready for that yet. 05/11/18 upon evaluation today patient actually appears to be doing in general fairly well in regard to her left lower Trinity. The swelling is very well controlled. With that being said she has a new area on the left anterior lower extremity as well as between the first and second toes of her left foot that was not present during the last evaluation. The region of her posterior left lower extremity actually appears to be almost completely healed. To be honest I'm very pleased with the way that stands. Nonetheless I do believe  that the lotion may be keeping the area to moist as far as her legs are concerned subsequently I'm gonna consider discontinuing that today. 05/26/18 on evaluation today patient appears to be doing rather well in regard to her left lower should be ulcers. In fact everything appears to be close except for a very small area on the left posterior lower extremity. Fortunately there does not appear to be any evidence of infection at this time. Overall very pleased with her progress. 06/02/18 and evaluation today patient actually appears to be doing very well in regard to her lower extremity ulcers. She has one small area that still continues to weep that I  think may benefit her being able to justify lotion and user Juxta-Lite wraps versus continued to wrap her. Nonetheless I think this is something we can definitely look into at this point. 06/23/18 on evaluation today patient unfortunately has openings of her bilateral lower extremities. In general she seems to be doing much worse than when I last saw her just as far as her overall health standpoint is concerned. She states that her discomfort is mainly due to neuropathy she's not having any other issues otherwise. No fevers, chills, nausea, or vomiting noted at this time. 06/30/18 on evaluation today patient actually appears to be doing a little worse in regard to her right lower extremity her left lower extremity of doing fairly well. Fortunately there is no sign of infection at this time. She has been tolerating the dressing changes without complication. Home health did not come out like they were supposed to for the appropriate wrap changes. They stated that they never received the orders from [place] which were fax. Nonetheless we will send a copy of the orders with the patient today as well. 07/07/18 on evaluation today patient appears to be doing much better in regard to lower extremities. She still has several openings bilaterally although since I last saw her her legs did show obvious signs of infection when she later saw her nurse. Subsequently a culture was obtained and she is been placed on Bactrim and Keflex. Fortunately things seem to be looking much better it does appear she likely had an infection. Again last week we'd even discussed it but again there really was not any obvious sign that she had infection therefore we held off on the antibiotics. Nonetheless I'm glad she's doing better today. 07/14/18 on evaluation today patient appears to be doing much better regarding her bilateral lower Trinity's. In fact on the right lower for me there's nothing open at this point there are some dry skin  areas at the sites where she had infection. Fortunately there is no evidence of systemic infection which is excellent news. No fevers chills noted 07/21/18 on evaluation today patient actually appears to be doing much better in regard to her left lower extremity ulcers. She is making good progress and overall I feel like she's improving each time I see her. She's having no pain I do feel like the infection is completely resolved which is excellent news. No fevers, chills, nausea, or vomiting noted at this time. 07/28/18 on evaluation today patient appears to be doing very well in regard to her left lower [employer]. Everything seems to be showing signs of improvement which is excellent news. Overall very pleased with the progress that has been made. Fortunately there's no evidence of active infection at this time also excellent news. 08/04/18 on evaluation today patient appears to be doing more poorly in regard  to her bilateral lower extremities. She has two new areas open up on the right and these were completely closed as of last week. She still has the two spots on the left which in my pinion seem to be doing better. Fortunately there's no evidence of infection again at this point. 08/11/18 on evaluation today patient actually appears to be doing very well in regard to her bilateral lower Trinity wounds that all seem to be doing better and are measures smaller today. Fortunately there's no signs of infection. No fevers, chills, nausea, or vomiting noted at this time. 08/18/18 on evaluation today patient actually appears to be doing about the same inverter bilateral lower extremities. She continues to have areas that blistering open as was drain that fortunately nothing too significant. Overall I feel like Drawtex may have done better for her however compared to the collagen. 08/25/18 on evaluation today patient appears to be doing a little bit more poorly today even compared to last time I saw  her. Again I'm not exactly sure why she's making worse progress over the past several weeks. I'm beginning to wonder if there is some kind of underlying low level infection causing this issue. I did actually take a culture from the left anterior lower extremity but it was a new wound draining quite a bit at this point. Unfortunately she also seems to be having more pain which is what also makes me worried about the possibility of infection. This is despite never erythema noted at this point. 09/01/18 on evaluation today patient actually appears to be doing a little worse even compared to last week in regard to bilateral lower extremities. She did go to the hospital on the 16th was given a dose of IV Zosyn and then discharged with a recommendation to continue with the Bactrim that I previously prescribed for her. Nonetheless she is still having a lot of discomfort she tells me as well at this time. This is definitely unfortunate. No fevers, chills, nausea, or vomiting noted at this time. 09/08/18 on evaluation today patient's bilateral lower extremities actually appear to be shown signs of improvement which is good news. Fortunately there does not appear to be any signs of active infection I think the anabiotic is helping in this regard. Overall I'm very pleased with how she is progressing. 09/15/18 patient was actually seen in ER yesterday due to her legs as well unfortunately. She states she's been having a lot of pain and discomfort as well as a lot of drainage. Upon inspection today the patient does have a lot of swelling and drainage I feel like this is more related to lymphedema and poor fluid control than it is to infection based on what I'm seeing. The physician in the emergency department also doubted that the patient was having a significant infection nonetheless I see no evidence of infection obvious at this point although I do see evidence of poor fluid control. She still not using a  compression pumps, she is not elevating due to her lift chair as well as her hospital bed being broken, and she really is not keeping her legs up as much as they should be and also has been taking off her wraps. All this combined I think has led to poor fluid control and to be honest she may be somewhat volume overloaded in general as well. I recommend that she may need to contact your physician to see if a prescription for a diuretic would be beneficial in their opinion.  As long as this is safe I think it would likely help her. 09/29/18 on evaluation today patient's left lower extremity actually appears to be doing quite a bit better. At least compared to last time that I saw her. She still has a large area where she is draining from but there's a lot of new skin speckled trout and in fact there's more new skin that there are open areas of weeping and drainage at this point. This is good news. With regard to the right lower extremity this is doing much better with the only open area that I really see being a dry spot on the right lateral ankle currently. Fortunately there's no signs of active infection at this time which is good news. No fevers, chills, nausea, or vomiting noted at this time. The patient seems somewhat stressed and overwhelmed during the visit today she was very lethargic as such. She does and she is not taking any pain medications at this point. Apparently according to her husband are also in the process of moving which is probably taking its toll on her as well. 10/06/18 on evaluation today patient appears to be doing rather well in regard to her lower extremities compared to last evaluation. Fortunately there's no signs of active infection. She tells me she did have an appointment with her primary. Nonetheless he was concerned that the wounds were somewhat deep based on pictures but we never actually saw her legs. She states that he had her somewhat worried due to the fact that  she was fearing now that she was [place] have to have an amputation. With that being said based on what I'm seeing check she looks better this week that she has the last two times I've seen her with much less drainage I'm actually pleased in this regard. That doesn't mean that she's out of the water but again I do not think what the point of talking about education at all in regard to her leg. She is very happy to hear this. She is also not having as much pain as she was having last week. 10/13/18 unfortunately on evaluation today patient still continues to have a significant amount of drainage she's not letting home health actually apply the compression dressings at this point. She's trying to use of Juxta-Lite of the top of Kerlex and the second layer of the three layer compression wrap. With that being said she just does not seem to be making as good a progress as I would expect if she was having the compression applied and in place on a regular basis. No fevers, chills, nausea, or vomiting noted at this time. 10/20/18 on evaluation today patient appears to be doing a little better in regard to her bilateral lower extremity ulcers. In fact the right lower extremity seems to be healed she doesn't even have any openings at this point left lower extremity though still somewhat macerated seems to be showing signs of new skin growth at multiple locations throughout. Fortunately there's no evidence of active infection at this time. No fevers, chills, nausea, or vomiting noted at this time. 10/27/18 on evaluation today patient appears to be doing much better in regard to her left lower Trinity ulcer. She's been tolerating the laptop complication and has minimal drainage noted at this point. Fortunately there's no signs of active infection at this time. No fevers, chills, nausea, or vomiting noted at this time. 11/03/18 on evaluation today patient actually appears to be doing excellent in regard to  her left  lower extremity. She is having very little drainage at this point there does not appear to be any significant signs of infection overall very pleased with how things have gone. She is likewise extremely pleased still and seems to be making wonderful progress week to week. I do believe antibiotics were helpful for her. Her primary care provider did place on amateur clean since I last saw her. 11/17/18 on evaluation today patient appears to be doing worse in regard to her bilateral lower extremities at this point. She is been tolerating the dressing changes without complication. With that being said she typically takes the Coban off fairly quickly upon arriving home even after being seen here in the clinic and does not allow home health reapply command as part of the dressing at home. Therefore she said no compression essentially since I last saw her as best I can tell. With that being said I think it shows and how much swelling she has in the open wounds that are noted at this point. Fortunately there's no signs of infection but unfortunately if she doesn't get this under control I think she will end up with infection and more significant issues. 11/24/18 on evaluation today patient actually appears to be doing somewhat better in regard to her bilateral lower extremities. 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 Nonetheless that is doing better currently so she should be able to attempt reinitiating the lymphedema pumps at this time. No fevers, chills, nausea, or vomiting noted at this time. 12/01/18 upon evaluation today patient's left lower extremity appears to be doing quite well unfortunately her right lower extremity is not doing nearly as well. She has been tolerating the dressing changes without complication unfortunately she did not keep a wrap on the  right at this time. Nonetheless I believe this has led to increased swelling and weeping in the world is actually much larger than during the last evaluation with her. 12/08/18 on evaluation today patient appears to be doing about the same at this point in regard to her right lower extremity. There is some more palatable to touch I'm concerned about the possibility of there being some infection although I think the main issue is she's not keeping her compression wrap on which in turn is not allowing this area to heal appropriately. 12/22/18 on evaluation today patient appears to be doing better in regard to left lower extremity unfortunately significantly worse in regard to the right lower extremity. The areas of blistering and necrotic superficial tissue have spread and again this does not really appear to be signs of infection and all she just doesn't seem to be doing nearly as well is what she has been in the past. Overall I feel like the Augmentin did absolutely nothing for her she doesn't seem to have any infection again I really didn't think so last time either is more of a potential preventative measure and hoping that this would make some difference but I think the main issue is she's not wearing her compression. She tells me she cannot wear the [place] been we put on she takes it off pretty much upon getting home. Subsequently she worshiped Juxta-Lite when I questioned her about how often she wears it this is no more than three hours a day obviously that leaves 21  hours that she has no compression and this is obviously not doing well for her. Overall I'm concerned that if things continue to worsen she is at great risk of both infection as well as losing her leg. 01/05/19 on evaluation today patient appears to be doing well in regard to her left lower extremity which he is allowing [place] to wrap and not so well with regard to her right lower extremity which she is not allowing [place] to really wrap  and keep the wrap on. She states that it hurts too badly whenever it's wrapped and she ends up having to take it off. She's been using the Juxta-Lite she tells me up to six hours a day although I question whether or not that's really been the case to be honest. Previously she told me three hours today nonetheless obviously the legs as long as the wrap is doing great when she is not is doing much more poorly. 01/12/2019 on evaluation today patient actually appears to be doing a little better in my opinion with regard to her right lower extremity ulcer. She has a small open area on the left lower extremity unfortunately but again this I think is part of the normal fluctuation of what she is going to have to expect with regard to her legs especially when she is not using her lymphedema pumps on a regular basis. Subsequently based on what I am seeing today I think that she does seem to be doing slightly better with regard to her right lower extremity she did see her primary care provider on Monday they felt she had an infection and placed her on 2 antibiotics. Both Cipro and clindamycin. Subsequently again she seems possibly to be doing a little bit better in regards to the right lower extremity she also tells me however she has been wearing the compression wrap over the past week since I spoke with her as well that is a Kerlix and Coban wrap on the right. No fevers, chills, nausea, vomiting, or diarrhea. 01/19/2019 on evaluation today patient appears to be doing better with regard to her bilateral lower extremities especially the right. I feel like the compression has been beneficial for her which is great news. She did get a call from her primary care provider on her way here today telling her that she did have methicillin-resistant Staphylococcus aureus and he was calling in a couple new antibiotics for her including a ointment to be applied she tells me 3 times a day. With that being said this sounds  like likely to be Bactroban which I think could be applied with each dressing/wrap change but I would not be able to accommodate her applying this 3 times a day. She is in agreement with the least doing this we will add that to her orders today. 01/26/2019 on evaluation today patient actually appears to be doing much better with regard to her right lower extremity. Her left lower extremity is also doing quite well all things considering. Fortunately there is no evidence of active infection at this time. No fevers, chills, nausea, vomiting, or diarrhea. 02/02/2019 on evaluation today patient appears to be doing much better compared to her last evaluation. Little by little off like her right leg is returning more towards normal. There does not appear to be any signs of active infection and overall she seems to be doing quite well which is great news. I am very pleased in this regard. No fevers, chills, nausea, vomiting, or diarrhea. 02/09/2019  upon evaluation today patient appears to be doing better with regard to her bilateral lower extremities. She has been tolerating the dressing changes without complication. Fortunately there is no signs of active infection at this time. No fevers, chills, nausea, vomiting, or diarrhea. 02/23/2019 on evaluation today patient actually appears to be doing quite well with regard to her bilateral lower extremities. She has been tolerating the dressing changes without complication. She is even used her pumps one time and states that she really felt like it felt good. With that being said she seems to be in good spirits and her legs appear to be doing excellent. 03/09/2019 on evaluation today patient appears to be doing well with regard to her right lower extremity there are no open wounds at this time she is having some discomfort but I feel like this is more neuropathy than anything. With regard to her left lower extremity she had several areas scattered around that she  does have some weeping and drainage from but again overall she does not appear to be having any significant issues and no evidence of infection at this time which is good news. 03/23/2019 on evaluation today patient appears to be doing well with regard to her right lower extremity which she tells me is still close she is using her juxta light here. Her left lower extremity she mainly just has an area on the foot which is still slightly draining although this also is doing great. Overall very pleased at this time. 04/06/2019 patient appears to be doing a little bit worse in regard to her left lower extremity upon evaluation today. She feels like this could be becoming infected again which she had issues with previous. Fortunately there is no signs of systemic infection but again this is always a struggle with her with her legs she will go from doing well to not so well in a very short amount of time. 04/20/2019 on evaluation today patient actually appears to be doing quite well with regard to her right lower extremity I do not see any signs of active infection at this time. Fortunately there is no fever chills noted. She is still taking the antibiotics which I prescribed for her at this point. In regard to the left lower extremity I do feel like some of these areas are better although again she still is having weeping from several locations at this time. 04/27/2019 on evaluation today patient appears to be doing about the same if not slightly worse in regard to her left lower extremity ulcers. She tells me when questioned that she has been sleeping in her Hoveround chair in fact she tells me she falls asleep without even knowing it. I think she is spending a whole lot of time in the chair and less time walking and moving around which is not good for her legs either. On top of that she is in a seated position which is also the worst position she is not really elevating her legs and she is also not  using her lymphedema pumps. All this is good to contribute to worsening of her condition in general. Electronic Signature(s) Signed: 04/27/2019 5:02:14 PM By: Lenda Kelp PA-C Entered By: Lenda Kelp on 04/27/2019 10:46:33 -------------------------------------------------------------------------------- Physical Exam Details Patient Name: Date of Service: [name]. 04/27/2019 9:30 AM Medical Record [id number] Patient Account Number: [id number] Date of Birth/Sex: Treating RN: [birth year] (67 y.o. [name] Primary Care Provider: Fatima Sanger Other Clinician: Referring Provider: Treating Provider/Extender:Stone III, Leonard Schwartz  SMITH, FRED Weeks in Treatment: 90 Constitutional Well-nourished and well-hydrated in no acute distress. Respiratory normal breathing without difficulty. clear to auscultation bilaterally. Cardiovascular regular rate and rhythm with normal S1, S2. Psychiatric this patient is able to make decisions and demonstrates good insight into disease process. Alert and Oriented x 3. pleasant and cooperative. Notes Patient's wounds currently again do appear to be larger than what they were last time I saw her not by much and I do not think there is any evidence of infection but she does have a lot of weeping I think her basically lack of physical activity and is being seated for such a long time is playing the main role in what is making this worse. We may discontinue the gentamicin at this point as I do not feel like that is really helping anything she does not seem to have any infection and it may just be contributing to additional moisture as well. Electronic Signature(s) Signed: 04/27/2019 5:02:14 PM By: Lenda Kelp PA-C Entered By: Lenda Kelp on 04/27/2019 10:47:12 -------------------------------------------------------------------------------- Physician Orders Details Patient Name: Date of Service: [name]. 04/27/2019 9:30  AM Medical Record [id number] Patient Account Number: [id number] Date of Birth/Sex: Treating RN: 1950-12-14 (67 y.o. 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 I89.0 Lymphedema, not elsewhere classified I87.331 Chronic venous hypertension (idiopathic) with ulcer and inflammation of right lower extremity I87.332 Chronic venous hypertension (idiopathic) with ulcer and inflammation of left lower extremity L97.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 leg with fat layer exposed L97.822 Non-pressure chronic ulcer of other part of left lower leg with fat layer exposed I10 Essential (primary) hypertension E66.01 Morbid (severe) obesity due to excess calories F41.8 Other specified anxiety disorders R53.1 Weakness Follow-up Appointments Return appointment in 3 weeks. Dressing Change Frequency Wound #57 Left,Dorsal Foot Change dressing three times week. Wound Cleansing Wound #57 Left,Dorsal Foot May shower with protection. - left leg Primary Wound Dressing Wound #57 Left,Dorsal Foot Calcium Alginate with Silver Other: - HOLD Gentamicin ointment to wound bed under alginate Wound #58 Left,Medial Ankle Calcium Alginate with Silver Other: - HOLD Gentamicin ointment to wound bed under alginate Wound #59 Left,Medial Lower Leg Calcium Alginate with Silver Other: - HOLD Gentamicin ointment to wound bed under alginate Wound #60 Left,Posterior Lower Leg Calcium Alginate with Silver Other: - HOLD Gentamicin ointment to wound bed under alginate Wound #61 Left,Anterior Lower Leg Calcium Alginate with Silver Other: - HOLD Gentamicin ointment to wound bed under alginate Secondary Dressing Dry Gauze - all wounds ABD pad  - as needed Edema Control 3 Layer Compression System - Left Lower Extremity Avoid standing for long periods of time - walking is encouraged Elevate legs to the level of the heart or above for 30 minutes daily and/or when sitting, a frequency of: - do not sleep in chair with feet dangling Exercise regularly Support Garment 20-30 mm/Hg pressure to: - Juxtalite to right lower leg daily Segmental Compressive Device. - lymphedema pumps 60 minutes 1- 2 times per day Additional Orders / Instructions Follow Nutritious Diet - To include vitamin A, vitamin C, and Zinc along with increased protein intake. Home Health Continue Home Health skilled nursing for wound care. - [facility] Electronic Signature(s) Signed: 04/27/2019 5:02:14 PM By: Lenda Kelp PA-C Signed: 04/27/2019 5:49:26  PM By: Zenaida Deed RN, BSN Entered By: Zenaida Deed on 04/27/2019 10:43:36 -------------------------------------------------------------------------------- Problem List Details Patient Name: Date of Service: [name]. 04/27/2019 9:30 AM Medical Record [id number] Patient Account Number: [id number] Date of Birth/Sex: Treating RN: 01-09-1951 (67 y.o. [name], [name] Primary Care Provider: Fatima Sanger Other Clinician: Referring Provider: Treating Provider/Extender:Stone III, Flo Shanks, FRED Weeks in Treatment: 17 Active Problems ICD-10 Evaluated Encounter Code Description Active Date Today Diagnosis E11.622 Type 2 diabetes mellitus with other skin ulcer 08/05/2017 No Yes I89.0 Lymphedema, not elsewhere classified 08/05/2017 No Yes I87.331 Chronic venous hypertension (idiopathic) with ulcer 08/05/2017 No Yes and inflammation of right lower extremity I87.332 Chronic venous hypertension (idiopathic) with ulcer 08/05/2017 No Yes and inflammation of left lower extremity L97.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 08/05/2017 No Yes leg with fat layer exposed L97.822  Non-pressure chronic ulcer of other part of left lower 08/05/2017 No Yes leg with fat layer exposed I10 Essential (primary) hypertension 08/05/2017 No Yes E66.01 Morbid (severe) obesity due to excess calories 08/05/2017 No Yes F41.8 Other specified anxiety disorders 08/05/2017 No Yes R53.1 Weakness 08/05/2017 No Yes Inactive Problems Resolved Problems Electronic Signature(s) Signed: 04/27/2019 5:02:14 PM By: Lenda Kelp PA-C Entered By: Lenda Kelp on 04/27/2019 09:34:50 -------------------------------------------------------------------------------- Progress Note Details Patient Name: Date of Service: [name]. 04/27/2019 9:30 AM Medical Record [id number] Patient Account Number: [id number] Date of Birth/Sex: Treating RN: September 19, 1950 (67 y.o. [name] Primary Care Provider: Fatima Sanger Other Clinician: Referring Provider: Treating Provider/Extender:Stone III, Flo Shanks, FRED Weeks in Treatment: 87 Subjective Chief Complaint Information obtained from Patient Bilateral reoccurring LE ulcers History of Present Illness (HPI) this patient has been seen a couple of times before and returns with recurrent problems to her right and left lower extremity with swelling and weeping ulcerations due to not wearing her compression stockings which she had been advised to do during her last discharge, at the end of June 2018. During her last visit the patient had had normal arterial blood flow and her venous reflux study did not necessitate any surgical intervention. She was recommended compression and elevation and wound care. After prolonged treatment the patient was completely healed but she has been noncompliant with wearing or compressions.. She was here last week with an outpatient return visit planned but the patient came in a very poor general condition with altered mental status and was rushed to the ER on my request. With a history of hypertension, diabetes,  TIA and right-sided weakness she was set up for an MRI on her brain and cervical spine and was sent to [facility]. Getting an MRI done was very difficult but once the workup was done she was found not to have any spinal stenosis, epidural abscess or hematoma or discitis. This was radiculopathy to be treated as an outpatient and she was given a follow-up appointment. Today she is feeling much better alert and oriented and has come to reevaluate her bilateral lower extremity lymphedema and ulceration 03/25/2017 -- she was admitted to the hospital on 03/16/2017 and discharged on 03/18/2017 with left leg cellulitis and ulceration. She was started on vancomycin and Zosyn and x-ray showed no bony involvement. She was treated for a cellulitis with IV antibiotics changed to Rocephin and Flagyl and was discharged on oral Keflex and doxycycline to complete a 7 day course. Last hemoglobin A1c was 7.1 and her other ailments including hypertension got asthma were appropriately treated. 05/06/2017 -- she is awaiting the right  size of compression stockings from [place] but other than that has been doing well. ====== Old notes 68 year old patient was seen one time last October and was lost to follow-up. She has recurrent problems with weeping and ulceration of her left lower extremity and has swelling of this for several years. It has been worse for the last 2 months. Past medical history is significant for diabetes mellitus type 2, hypertension, gout, morbid obesity, depressive disorders, hiatal hernia, migraines, status post knee surgery, risk of a cholecystectomy, vaginal hysterectomy and breast biopsy. She is not a smoker. As noted before she has never had a venous duplex study and an arterial ABI study was attempted but the left lower extremity was noncompressible 10/01/2016 -- had a lower extremity venous duplex reflux evaluation which showed no evidence of deep vein reflux in the right or left lower  extremity, and no evidence of great saphenous vein reflux more than 500 ms in the right or left lower extremity, and the left small saphenous vein is incompetent but no vascular consult was recommended. review of her electronic medical records noted that the ABI was checked in July 2017 where the right ABI was normal limits and the left ABI could not be ascertained due to pain with cuff pressure but the waveforms are within normal limits. her arterial duplex study scheduled for April 27. 10/08/2016 -- the patient has various reasons for not having a compression on and for the last 3 days she has had no compression on her left lower extremity either due to pain or the lack of nursing help. She does not use her juxta lites either. 10/15/2016 -- the patient did not keep her appointment for arterial duplex study on April 27 and I have asked her to reschedule this. Her pain is out of proportion with the physical findings and she continuously fails to wear a compression wraps and cuts them off because she says she cannot tolerate the pain. She does not use her juxta lites either. 10/22/2016 -- he has rescheduled her arterial duplex study to May 21 and her pain today is a bit better. She has not been wearing her juxta lites on her right lower extremity but now understands that she needs to do this. She did tolerate the to press compression wrap on her left lower extremity 10/29/2016 --arterial duplex study is scheduled for next week and overall she has been tolerating her compression wraps and also using her juxta lites on her right lower extremity 11/05/2016 -- the right ABI was 0.95 the left was 1.03. The digit TBI is on the right was 0.83 on the left was 0.92 and she had biphasic flow through these vessels. The impression was that of normal lower extremity arterial study. 11/12/2016 -- her pain is minimal and she is doing very well overall. 11/26/2016 -- she has got juxta lites and her insurance  will not pay for additional dual layer compression stockings. She is going to order some from [place]. 05/12/2017 -- her juxta lites are very old and too big for her and these have not been helping with compression. She did get 20-30 mm compression stockings from [place] but she and her husband are unable to put these on. I believe she will benefit from bilateral Extremit-ease, compression stockings and we will measure her for these today. 05/20/2017 -- lymphedema on the left lower extremity has increased a lot and she has a open ulceration as a result of this. The right lower extremity is looking pretty good.  She has decided to by the compression stockings herself and will get reimbursed by the home health, at a later date. 05/27/2017 -- her sciatica is bothering her a lot and she thought her left leg pain was caused due to the compression wrap and hence removed it and has significant lymphedema. There is no inflammation on this left lower extremity. 06/17/17 on evaluation today patient appears to be doing very well and in fact is completely healed in regard to her ulcerations. Unfortunately however she does have continued issues with lymphedema nonetheless. We did order compression garments for her unfortunately she states that the size that she received were large although we ordered medium. Obviously this means she is not getting the optimal compression. She does not have those with her today and therefore we could not confirm and contact the company on her behalf. Nonetheless she does state that she is going to have her husband bring them by tomorrow so that we can verify and then get in touch with the company. No fevers, chills, nausea, or vomiting noted at this time. Overall patient is doing better otherwise and I'm pleased with the progress she has made. 07/01/17 on evaluation today patient appears to be doing very well in regard to her bilateral lower extremity she does not have any  openings at this point which is excellent news. Overall I'm pleased with how things have progressed up to this time. Since she is doing so well we did order her compression which we are seeing her today to ensure that it fits her properly and everything is doing well in that regard and then subsequently she will be discharged. ============ Old Notes: 03/31/16 patient presents today for evaluation concerning open wounds that she has over the left medial ankle region as well as the left dorsal foot. She has previously had this occur although it has been healed for a number of months after having this for about a year prior until her hospitalization on 01/05/16. At that point in time it appears that she was admitted to the hospital for left lower extremity cellulitis and was placed on vancomycin and Zosyn at that point. Eventually upon discharge on January 15, 2016 she was placed on doxycycline at that point in time. Later on 03/27/16 positive wound culture growing Escherichia coli this was switched to amoxicillin. Currently she tells me that she is having pain radiated to be a 7 out of 10 which can be as high as 10 out of 10 with palpation and manipulation of the wound. This wound appears to be mainly venous in nature due to the bilateral lower extremity venous stasis/lymphedema. This is definitely much worse on her left than the right side. She does have type 1 diabetes mellitus, hypertension, morbid obesity, and is wheelchair dependent.during the course of the hospital stay a blood culture was also obtained and fortunately appeared negative. She also had an x-ray of the tibia/fibula on the left which showed no acute bone abnormality. Her white blood cell count which was performed last on 03/25/16 was 7.3, hemoglobin 12.8, protein 7.1, albumin 3.0. Her urine culture appeared to be negative for any specific organisms. Patient did have a left lower extremity venous duplex evaluation for DVT. This did not  include venous reflux studies but fortunately was negative for DVT. Patient also had arterial studies performed which revealed that she had a normal ABI on the right though this was unable to be performed on the left secondary to pain that she was having  around the ankle region due to the wound. However it was stated on report that she had biphasic pulses and apparently good blood flow. ========== 06/03/17 she is here in follow-up evaluation for right lower extremity ulcer. The right lower sure he has healed but she has reopened to the left medial malleolus and dorsal foot with weeping. She is waiting for new compression garments to arrive from home health, the previous compression garments were ill fitting. We will continue with compression bilaterally and follow-up in 2 weeks Readmission: 08/05/17 on evaluation today patient appears to be doing somewhat poorly in regard to her left lower extremity especially although the right lower extremity has a small area which may no longer be open. She has been having a lot of drainage from the left lower extremity however he tells me that she has not been able to use the EXTREMIT- EASE Compression at this point. She states that she did better and was able to actually apply the Juxta-Lite compression although the wound that she has is too large and therefore really does not compress which is why she cannot wear it at this point. She has no one who can help her put it on regular basis her son can sometimes but he's not able to do it most of the time. I do believe that's why she has begun to weave and have issues as she is currently yet again. No fevers, chills, nausea, or vomiting noted at this time. Patient is no evidence of dementia. 08/12/17 on evaluation today patient appears to still be doing fairly well in regard to the draining areas/weeping areas at this point. With that being said she unfortunately did go to the ER yesterday due to what was felt to  be possibly a cellulitis. They place her on doxycycline by mouth and discharge her home. She definitely was not admitted. With that being said she states she has had more discomfort which has been unusual for her even compared to prior times and she's had infections.08/12/17 on evaluation today patient appears to still be doing fairly well in regard to the draining areas/weeping areas at this point. With that being said she unfortunately did go to the ER yesterday due to what was felt to be possibly a cellulitis. They place her on doxycycline by mouth and discharge her home. She definitely was not admitted. With that being said she states she has had more discomfort which has been unusual for her even compared to prior times and she's had infections. 08/19/17 put evaluation today patient tells me that she's been having a lot of what sounds to be neuropathic type pain in regard to her left lower extremity. She has been using over-the-counter topical bins again which some believe. That in order to apply the she actually remove the wrap we put on her last Wednesday on Thursday. Subsequently she has not had anything on compression wise since that time. The good news is a lot of the weeping areas appear to have closed at this point again I believe she would do better with compression but we are struggling to get her to actually use what she needs to at this point. No fevers, chills, nausea, or vomiting noted at this time. 09/03/17 on evaluation today patient appears to be doing okay in regard to her lower extremities in regard to the lymphedema and weeping. Fortunately she does not seem to show any signs of infection at this point she does have a little bit of weeping occurring in  the right medial malleolus area. With that being said this does not appear to be too significant which is good news. 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 She has severe skin damage secondary to both of these features involving the dorsal left foot and medial left ankle and lower leg. Still has open areas in the left anterior foot. The area on the right closed over. She uses her own juxta light stockings. She does not have an arterial issue 09/16/17 on evaluation today patient actually appears to be doing excellent in regard to her bilateral lower extremity swelling. The Juxta-Lite compression wrap seem to be doing very well for her. She has not however been using the portion that goes over her foot. Her left foot still is draining a little bit not nearly as significant as it has been in the past but still I do believe that she likely needs to utilize the full wrap including the foot portion of this will improve as well. She also has been apparently putting on a significant amount of Vaseline which also think is not helpful for her. I recommended that if she feels she needs something for moisturizer Eucerin will probably be better. 09/30/17 on evaluation today patient presents with several new open areas in regard to her left lower extremity although these appear to be minimal and mainly seem to be more moisture breakdown than anything. Fortunately she does not seem to have any evidence of infection which is great news. She has been tolerating the dressing changes without complication we are using silver alginate on the foot she has been using AB pads to have the legs and using her Juxta-Lite compression which seems to be controlling her swelling very well. Overall I'm pleased with the poor way she has progressed. 10/14/17 on evaluation today patient appears to be doing better in regard to her left lower extremity areas of weeping. She does still have some discomfort although in general this does not appear to be as macerated and I think it is progressing nicely. I do think she still needs to wear the foot portion of her Juxta-Lite in order  to get the most benefit from the wrap obviously. She states she understands. Fortunately there does not appear to be evidence of infection at this time which is great news. 10/28/17 on evaluation today patient appears to be doing excellent in regard to her left lower extremity. She has just a couple areas that are still open and seem to be causing any trouble whatsoever. For that reason I think that she is definitely headed in the right direction the spots are very tiny compared to what we have been dealing with in the past. 11/11/17 on evaluation today patient appears to have a right lateral lower extremity ulcer that has opened since I last saw her. She states this is where the home health nurse that was coming out remove the dressing without wetting the alginate first. Nonetheless I do not know if this is indeed the case or not but more importantly we have not ordered home help to be coming out for her wounds at all. I'm unsure as to why they are coming out and we're gonna have to check on this and get things situated in that regard. With that being said we currently really do not need them to be coming out as the patient has been taking care of her leg herself without complication and no issues. In  fact she was doing much better prior to nursing coming out. 11/25/17 on evaluation today patient actually appears to be doing fairly well in regard to her left lower extremity swelling. In fact she has very little area of weeping at this point there's just a small spot on the lateral portion of her right leg that still has me just a little bit more concerned as far as wanting to see this clear up before I discharge her to caring for this at home. Nonetheless overall she has made excellent progress. 12/09/17 on evaluation today patient appears to be doing rather well in regard to her lower extremity edema. She does have some weeping still in the left lower extremity although the big area we were taking  care of two weeks ago actually has closed and she has another area of weeping on the left lower extremity immediately as well is the top of her foot. She does not currently have lymphedema pumps she has been wearing her compression daily on a regular basis as directed. With that being said I think she may benefit from lymphedema pumps. 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 We had a very difficult time getting and keeping this under control. 12/23/17 on evaluation today patient actually appears to be doing a little bit more poorly in regard to her bilateral lower extremities. She has been tolerating the Juxta-Lite compression wraps. Unfortunately she has two new ulcers on the right lower extremity and left lower [place] ulceration seems to be larger. Obviously this is not good news. She has been tolerating the dressings without complication. 12/30/17 on evaluation today patient actually appears to be doing much better in regard to her bilateral lower extremity edema. She continues to have some issues with ulcerations and in fact there appears to be one spot on each leg where the wrap may have caused a little bit of a blister which is subsequently opened up at this point is given her pain. Fortunately it does not appear to be any evidence of infection which is good news. No fevers chills noted. 01/13/18 on evaluation today patient appears to be doing rather well in regard to her bilateral lower extremities. The dressings did get kind of stuck as far as the wound beds are concerned but again I think this is mainly due to the fact that she actually seems to be showing signs of healing which is good news. She's not having as much drainage therefore she was having more of the dressing sticking. Nonetheless overall I feel like her swelling  is dramatically down compared to previous. 01/20/18 on evaluation today patient unfortunately though she's doing better in most regards has a large blister on the left anterior lower extremity where she is draining quite significantly. Subsequently this is going to need debridement today in order to see what's underneath and ensure she does not continue to trapping fluid at this location. Nonetheless No fevers, chills, nausea, or vomiting noted at this time. 01/27/18 on evaluation today patient appears to be doing rather well at this point in regard to her right lower extremity there's just a very small area that she still has open at this point. With that being said I do believe that she is tolerating the compression wraps very well in making good progress. Home health is coming out at this point to  see her. Her left lower extremity on the lateral portion is actually what still mainly open and causing her some discomfort for the most part 02/10/18 on evaluation today patient actually appears to be doing very well in regard to her right lower extremity were all the ulcers appear to be completely close. In regard to the left lower extremity she does have two areas still open and some leaking from the dorsal surface of her foot but this still seems to be doing much better to me in general. 02/24/18 on evaluation today patient actually appears to be doing much better in regard to her right lower extremity this is still completely healed. Her left lower extremity is also doing much better fortunately she has no evidence of infection. The one area that is gonna require some debridement is still on the left anterior shin. Fortunately this is not hurting her as badly today. 03/10/18 on evaluation today patient appears to be doing better in some regards although she has a little bit more open area on the dorsal foot and she also has some issues on the medial portion of the left lower extremity which  is actually new and somewhat deep. With that being said there fortunately does not appear to be any significant signs of infection which is good news. No fevers, chills, nausea, or vomiting noted at this time. In general her swelling seems to be doing fairly well which is good news. 03/31/18 on evaluation today patient presents for follow-up concerning her left lower extremity lymphedema. Unfortunately she has been doing a little bit more poorly since I last saw her in regard to the amount of weeping that she is experiencing. She's also having some increased pain in the anterior shin location. Unfortunately I do not feel like the patient is making such good progress at this point a few weeks back she was definitely doing much better. 04/07/18 on evaluation today patient actually appears to be showing some signs of improvement as far as the left lower extremity is concerned. She has been tolerating the dressing changes and it does appear that the Drawtex did better for her. With that being said unfortunately home health is stating that they cannot obtain the Drawtex going forward. Nonetheless we're gonna have to check and see what they may be able to get the alginate they were using was getting stuck in causing new areas of skin being pulled all that with and subsequently weep and calls her to worsen overall this is the first time we've seen improvement at this time. 04/14/18 on evaluation today patient actually appears to be doing rather well at this point there does not appear to be any evidence of infection at this time and she is actually doing excellent in regard to the weeping in fact she almost has no openings remaining even compared to just last week this is a dramatic improvement. No fevers chills noted 04/21/18 evaluation today patient actually appears to be doing very well. She in fact is has a small area on the posterior lower extremity location and she has a small area on the dorsal  surface of her foot that are still open both of which are very close to closing. We're hoping this will be close shortly. She brought her Juxta-Lite wrap with her today hoping that would be able to put her in it unfortunately I don't think were quite at that point yet but we're getting closer. 04/28/18 upon evaluation today patient actually appears to be doing excellent  in regard to her left lower extremity ulcer. In fact the region on the posterior lower extremity actually is much smaller than previously noted. Overall I'm very happy with the progress she has made. She again did bring her Juxta-Lite although we're not quite ready for that yet. 05/11/18 upon evaluation today patient actually appears to be doing in general fairly well in regard to her left lower Trinity. The swelling is very well controlled. With that being said she has a new area on the left anterior lower extremity as well as between the first and second toes of her left foot that was not present during the last evaluation. The region of her posterior left lower extremity actually appears to be almost completely healed. To be honest I'm very pleased with the way that stands. Nonetheless I do believe that the lotion may be keeping the area to moist as far as her legs are concerned subsequently I'm gonna consider discontinuing that today. 05/26/18 on evaluation today patient appears to be doing rather well in regard to her left lower should be ulcers. In fact everything appears to be close except for a very small area on the left posterior lower extremity. Fortunately there does not appear to be any evidence of infection at this time. Overall very pleased with her progress. 06/02/18 and evaluation today patient actually appears to be doing very well in regard to her lower extremity ulcers. She has one small area that still continues to weep that I think may benefit her being able to justify lotion and user Juxta-Lite wraps versus  continued to wrap her. Nonetheless I think this is something we can definitely look into at this point. 06/23/18 on evaluation today patient unfortunately has openings of her bilateral lower extremities. In general she seems to be doing much worse than when I last saw her just as far as her overall health standpoint is concerned. She states that her discomfort is mainly due to neuropathy she's not having any other issues otherwise. No fevers, chills, nausea, or vomiting noted at this time. 06/30/18 on evaluation today patient actually appears to be doing a little worse in regard to her right lower extremity her left lower extremity of doing fairly well. Fortunately there is no sign of infection at this time. She has been tolerating the dressing changes without complication. Home health did not come out like they were supposed to for the appropriate wrap changes. They stated that they never received the orders from [place] which were fax. Nonetheless we will send a copy of the orders with the patient today as well. 07/07/18 on evaluation today patient appears to be doing much better in regard to lower extremities. She still has several openings bilaterally although since I last saw her her legs did show obvious signs of infection when she later saw her nurse. Subsequently a culture was obtained and she is been placed on Bactrim and Keflex. Fortunately things seem to be looking much better it does appear she likely had an infection. Again last week we'd even discussed it but again there really was not any obvious sign that she had infection therefore we held off on the antibiotics. Nonetheless I'm glad she's doing better today. 07/14/18 on evaluation today patient appears to be doing much better regarding her bilateral lower Trinity's. In fact on the right lower for me there's nothing open at this point there are some dry skin areas at the sites where she had infection. Fortunately there is no evidence of  systemic infection which is excellent news. No fevers chills noted 07/21/18 on evaluation today patient actually appears to be doing much better in regard to her left lower extremity ulcers. She is making good progress and overall I feel like she's improving each time I see her. She's having no pain I do feel like the infection is completely resolved which is excellent news. No fevers, chills, nausea, or vomiting noted at this time. 07/28/18 on evaluation today patient appears to be doing very well in regard to her left lower [employer]. Everything seems to be showing signs of improvement which is excellent news. Overall very pleased with the progress that has been made. Fortunately there's no evidence of active infection at this time also excellent news. 08/04/18 on evaluation today patient appears to be doing more poorly in regard to her bilateral lower extremities. She has two new areas open up on the right and these were completely closed as of last week. She still has the two spots on the left which in my pinion seem to be doing better. Fortunately there's no evidence of infection again at this point. 08/11/18 on evaluation today patient actually appears to be doing very well in regard to her bilateral lower Trinity wounds that all seem to be doing better and are measures smaller today. Fortunately there's no signs of infection. No fevers, chills, nausea, or vomiting noted at this time. 08/18/18 on evaluation today patient actually appears to be doing about the same inverter bilateral lower extremities. She continues to have areas that blistering open as was drain that fortunately nothing too significant. Overall I feel like Drawtex may have done better for her however compared to the collagen. 08/25/18 on evaluation today patient appears to be doing a little bit more poorly today even compared to last time I saw her. Again I'm not exactly sure why she's making worse progress over the past  several weeks. I'm beginning to wonder if there is some kind of underlying low level infection causing this issue. I did actually take a culture from the left anterior lower extremity but it was a new wound draining quite a bit at this point. Unfortunately she also seems to be having more pain which is what also makes me worried about the possibility of infection. This is despite never erythema noted at this point. 09/01/18 on evaluation today patient actually appears to be doing a little worse even compared to last week in regard to bilateral lower extremities. She did go to the hospital on the 16th was given a dose of IV Zosyn and then discharged with a recommendation to continue with the Bactrim that I previously prescribed for her. Nonetheless she is still having a lot of discomfort she tells me as well at this time. This is definitely unfortunate. No fevers, chills, nausea, or vomiting noted at this time. 09/08/18 on evaluation today patient's bilateral lower extremities actually appear to be shown signs of improvement which is good news. Fortunately there does not appear to be any signs of active infection I think the anabiotic is helping in this regard. Overall I'm very pleased with how she is progressing. 09/15/18 patient was actually seen in ER yesterday due to her legs as well unfortunately. She states she's been having a lot of pain and discomfort as well as a lot of drainage. Upon inspection today the patient does have a lot of swelling and drainage I feel like this is more related to lymphedema and poor fluid control than it  is to infection based on what I'm seeing. The physician in the emergency department also doubted that the patient was having a significant infection nonetheless I see no evidence of infection obvious at this point although I do see evidence of poor fluid control. She still not using a compression pumps, she is not elevating due to her lift chair as well as  her hospital bed being broken, and she really is not keeping her legs up as much as they should be and also has been taking off her wraps. All this combined I think has led to poor fluid control and to be honest she may be somewhat volume overloaded in general as well. I recommend that she may need to contact your physician to see if a prescription for a diuretic would be beneficial in their opinion. As long as this is safe I think it would likely help her. 09/29/18 on evaluation today patient's left lower extremity actually appears to be doing quite a bit better. At least compared to last time that I saw her. She still has a large area where she is draining from but there's a lot of new skin speckled trout and in fact there's more new skin that there are open areas of weeping and drainage at this point. This is good news. With regard to the right lower extremity this is doing much better with the only open area that I really see being a dry spot on the right lateral ankle currently. Fortunately there's no signs of active infection at this time which is good news. No fevers, chills, nausea, or vomiting noted at this time. The patient seems somewhat stressed and overwhelmed during the visit today she was very lethargic as such. She does and she is not taking any pain medications at this point. Apparently according to her husband are also in the process of moving which is probably taking its toll on her as well. 10/06/18 on evaluation today patient appears to be doing rather well in regard to her lower extremities compared to last evaluation. Fortunately there's no signs of active infection. She tells me she did have an appointment with her primary. Nonetheless he was concerned that the wounds were somewhat deep based on pictures but we never actually saw her legs. She states that he had her somewhat worried due to the fact that she was fearing now that she was [place] have to have an amputation. With  that being said based on what I'm seeing check she looks better this week that she has the last two times I've seen her with much less drainage I'm actually pleased in this regard. That doesn't mean that she's out of the water but again I do not think what the point of talking about education at all in regard to her leg. She is very happy to hear this. She is also not having as much pain as she was having last week. 10/13/18 unfortunately on evaluation today patient still continues to have a significant amount of drainage she's not letting home health actually apply the compression dressings at this point. She's trying to use of Juxta-Lite of the top of Kerlex and the second layer of the three layer compression wrap. With that being said she just does not seem to be making as good a progress as I would expect if she was having the compression applied and in place on a regular basis. No fevers, chills, nausea, or vomiting noted at this time. 10/20/18 on evaluation today patient  appears to be doing a little better in regard to her bilateral lower extremity ulcers. In fact the right lower extremity seems to be healed she doesn't even have any openings at this point left lower extremity though still somewhat macerated seems to be showing signs of new skin growth at multiple locations throughout. Fortunately there's no evidence of active infection at this time. No fevers, chills, nausea, or vomiting noted at this time. 10/27/18 on evaluation today patient appears to be doing much better in regard to her left lower Trinity ulcer. She's been tolerating the laptop complication and has minimal drainage noted at this point. Fortunately there's no signs of active infection at this time. No fevers, chills, nausea, or vomiting noted at this time. 11/03/18 on evaluation today patient actually appears to be doing excellent in regard to her left lower extremity. She is having very little drainage at this point there  does not appear to be any significant signs of infection overall very pleased with how things have gone. She is likewise extremely pleased still and seems to be making wonderful progress week to week. I do believe antibiotics were helpful for her. Her primary care provider did place on amateur clean since I last saw her. 11/17/18 on evaluation today patient appears to be doing worse in regard to her bilateral lower extremities at this point. She is been tolerating the dressing changes without complication. With that being said she typically takes the Coban off fairly quickly upon arriving home even after being seen here in the clinic and does not allow home health reapply command as part of the dressing at home. Therefore she said no compression essentially since I last saw her as best I can tell. With that being said I think it shows and how much swelling she has in the open wounds that are noted at this point. Fortunately there's no signs of infection but unfortunately if she doesn't get this under control I think she will end up with infection and more significant issues. 11/24/18 on evaluation today patient actually appears to be doing somewhat better in regard to her bilateral lower extremities. 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 Nonetheless that is doing better currently so she should be able to attempt reinitiating the lymphedema pumps at this time. No fevers, chills, nausea, or vomiting noted at this time. 12/01/18 upon evaluation today patient's left lower extremity appears to be doing quite well unfortunately her right lower extremity is not doing nearly as well. She has been tolerating the dressing changes without complication unfortunately she did not keep a wrap on the right at this time. Nonetheless I believe this has led to  increased swelling and weeping in the world is actually much larger than during the last evaluation with her. 12/08/18 on evaluation today patient appears to be doing about the same at this point in regard to her right lower extremity. There is some more palatable to touch I'm concerned about the possibility of there being some infection although I think the main issue is she's not keeping her compression wrap on which in turn is not allowing this area to heal appropriately. 12/22/18 on evaluation today patient appears to be doing better in regard to left lower extremity unfortunately significantly worse in regard to the right lower extremity. The areas of  blistering and necrotic superficial tissue have spread and again this does not really appear to be signs of infection and all she just doesn't seem to be doing nearly as well is what she has been in the past. Overall I feel like the Augmentin did absolutely nothing for her she doesn't seem to have any infection again I really didn't think so last time either is more of a potential preventative measure and hoping that this would make some difference but I think the main issue is she's not wearing her compression. She tells me she cannot wear the [place] been we put on she takes it off pretty much upon getting home. Subsequently she worshiped Juxta-Lite when I questioned her about how often she wears it this is no more than three hours a day obviously that leaves 21 hours that she has no compression and this is obviously not doing well for her. Overall I'm concerned that if things continue to worsen she is at great risk of both infection as well as losing her leg. 01/05/19 on evaluation today patient appears to be doing well in regard to her left lower extremity which he is allowing [place] to wrap and not so well with regard to her right lower extremity which she is not allowing [place] to really wrap and keep the wrap on. She states that it hurts too badly  whenever it's wrapped and she ends up having to take it off. She's been using the Juxta-Lite she tells me up to six hours a day although I question whether or not that's really been the case to be honest. Previously she told me three hours today nonetheless obviously the legs as long as the wrap is doing great when she is not is doing much more poorly. 01/12/2019 on evaluation today patient actually appears to be doing a little better in my opinion with regard to her right lower extremity ulcer. She has a small open area on the left lower extremity unfortunately but again this I think is part of the normal fluctuation of what she is going to have to expect with regard to her legs especially when she is not using her lymphedema pumps on a regular basis. Subsequently based on what I am seeing today I think that she does seem to be doing slightly better with regard to her right lower extremity she did see her primary care provider on Monday they felt she had an infection and placed her on 2 antibiotics. Both Cipro and clindamycin. Subsequently again she seems possibly to be doing a little bit better in regards to the right lower extremity she also tells me however she has been wearing the compression wrap over the past week since I spoke with her as well that is a Kerlix and Coban wrap on the right. No fevers, chills, nausea, vomiting, or diarrhea. 01/19/2019 on evaluation today patient appears to be doing better with regard to her bilateral lower extremities especially the right. I feel like the compression has been beneficial for her which is great news. She did get a call from her primary care provider on her way here today telling her that she did have methicillin-resistant Staphylococcus aureus and he was calling in a couple new antibiotics for her including a ointment to be applied she tells me 3 times a day. With that being said this sounds like likely to be Bactroban which I think could be  applied with each dressing/wrap change but I would not be able to accommodate  her applying this 3 times a day. She is in agreement with the least doing this we will add that to her orders today. 01/26/2019 on evaluation today patient actually appears to be doing much better with regard to her right lower extremity. Her left lower extremity is also doing quite well all things considering. Fortunately there is no evidence of active infection at this time. No fevers, chills, nausea, vomiting, or diarrhea. 02/02/2019 on evaluation today patient appears to be doing much better compared to her last evaluation. Little by little off like her right leg is returning more towards normal. There does not appear to be any signs of active infection and overall she seems to be doing quite well which is great news. I am very pleased in this regard. No fevers, chills, nausea, vomiting, or diarrhea. 02/09/2019 upon evaluation today patient appears to be doing better with regard to her bilateral lower extremities. She has been tolerating the dressing changes without complication. Fortunately there is no signs of active infection at this time. No fevers, chills, nausea, vomiting, or diarrhea. 02/23/2019 on evaluation today patient actually appears to be doing quite well with regard to her bilateral lower extremities. She has been tolerating the dressing changes without complication. She is even used her pumps one time and states that she really felt like it felt good. With that being said she seems to be in good spirits and her legs appear to be doing excellent. 03/09/2019 on evaluation today patient appears to be doing well with regard to her right lower extremity there are no open wounds at this time she is having some discomfort but I feel like this is more neuropathy than anything. With regard to her left lower extremity she had several areas scattered around that she does have some weeping and drainage from but again  overall she does not appear to be having any significant issues and no evidence of infection at this time which is good news. 03/23/2019 on evaluation today patient appears to be doing well with regard to her right lower extremity which she tells me is still close she is using her juxta light here. Her left lower extremity she mainly just has an area on the foot which is still slightly draining although this also is doing great. Overall very pleased at this time. 04/06/2019 patient appears to be doing a little bit worse in regard to her left lower extremity upon evaluation today. She feels like this could be becoming infected again which she had issues with previous. Fortunately there is no signs of systemic infection but again this is always a struggle with her with her legs she will go from doing well to not so well in a very short amount of time. 04/20/2019 on evaluation today patient actually appears to be doing quite well with regard to her right lower extremity I do not see any signs of active infection at this time. Fortunately there is no fever chills noted. She is still taking the antibiotics which I prescribed for her at this point. In regard to the left lower extremity I do feel like some of these areas are better although again she still is having weeping from several locations at this time. 04/27/2019 on evaluation today patient appears to be doing about the same if not slightly worse in regard to her left lower extremity ulcers. She tells me when questioned that she has been sleeping in her Hoveround chair in fact she tells me she falls asleep without even  knowing it. I think she is spending a whole lot of time in the chair and less time walking and moving around which is not good for her legs either. On top of that she is in a seated position which is also the worst position she is not really elevating her legs and she is also not using her lymphedema pumps. All this is good to  contribute to worsening of her condition in general. Patient History Information obtained from Patient. Family History Cancer - Siblings, Hypertension - Siblings, Stroke - Father, No family history of Diabetes, Heart Disease, Hereditary Spherocytosis, Kidney Disease, Lung Disease, Seizures, Thyroid Problems, Tuberculosis. Social History Never smoker, Marital Status - Married, Alcohol Use - Never, Drug Use - No History, Caffeine Use - Never. Medical History Eyes Denies history of Cataracts, Glaucoma, Optic Neuritis Ear/Nose/Mouth/Throat Denies history of Chronic sinus problems/congestion, Middle ear problems Hematologic/Lymphatic Denies history of Anemia, Hemophilia, Human Immunodeficiency Virus, Lymphedema, Sickle Cell Disease Respiratory Patient has history of Asthma Denies history of Aspiration, Chronic Obstructive Pulmonary Disease (COPD), Pneumothorax, Sleep Apnea, Tuberculosis Cardiovascular Patient has history of Hypertension, Peripheral Arterial Disease, Peripheral Venous Disease - chronic Denies history of Angina, Arrhythmia, Congestive Heart Failure, Coronary Artery Disease, Deep Vein Thrombosis, Hypotension, Myocardial Infarction, Phlebitis, Vasculitis Endocrine Patient has history of Type II Diabetes Genitourinary Denies history of End Stage Renal Disease Immunological Denies history of Lupus Erythematosus, Raynaudoos, Scleroderma Musculoskeletal Patient has history of Gout, Osteoarthritis Denies history of Rheumatoid Arthritis, Osteomyelitis Neurologic Denies history of Dementia, Neuropathy, Quadriplegia, Paraplegia, Seizure Disorder Psychiatric Denies history of Anorexia/bulimia, Confinement Anxiety Hospitalization/Surgery History - leg cellulitis. Medical And Surgical History Notes Constitutional Symptoms (General Health) morbid obesity, wheelchair dependent Integumentary (Skin) LLL cellulitis , wound cx grew E. Coli Psychiatric insomnia , adjustment  disorder, depression with anxiety Review of Systems (ROS) Constitutional Symptoms (General Health) Denies complaints or symptoms of Fatigue, Fever, Chills, Marked Weight Change. Respiratory Denies complaints or symptoms of Chronic or frequent coughs, Shortness of Breath. Cardiovascular Denies complaints or symptoms of Chest pain. Psychiatric Denies complaints or symptoms of Claustrophobia, Suicidal. Objective Constitutional Well-nourished and well-hydrated in no acute distress. Vitals Time Taken: 9:50 AM, Height: 62 in, Weight: 335 lbs, BMI: 61.3, Temperature: 98.6 F, Pulse: 83 bpm, Respiratory Rate: 18 breaths/min, Blood Pressure: 134/77 mmHg, Capillary Blood Glucose: 165 mg/dl. General Notes: CBG per patient Respiratory normal breathing without difficulty. clear to auscultation bilaterally. Cardiovascular regular rate and rhythm with normal S1, S2. Psychiatric this patient is able to make decisions and demonstrates good insight into disease process. Alert and Oriented x 3. pleasant and cooperative. General Notes: Patient's wounds currently again do appear to be larger than what they were last time I saw her not by much and I do not think there is any evidence of infection but she does have a lot of weeping I think her basically lack of physical activity and is being seated for such a long time is playing the main role in what is making this worse. We may discontinue the gentamicin at this point as I do not feel like that is really helping anything she does not seem to have any infection and it may just be contributing to additional moisture as well. Integumentary (Hair, Skin) Wound #57 status is Open. Original cause of wound was Gradually Appeared. The wound is located on the Left,Dorsal Foot. The wound measures 5cm length x 7.2cm width x 0.1cm depth; 28.274cm^2 area and 2.827cm^3 volume. There is Fat Layer (Subcutaneous Tissue) Exposed exposed. There is no  tunneling or undermining  noted. There is a small amount of serosanguineous drainage noted. The wound margin is distinct with the outline attached to the wound base. There is medium (34-66%) pink, pale granulation within the wound bed. There is a medium (34- 66%) amount of necrotic tissue within the wound bed including Adherent Slough. Wound #58 status is Open. Original cause of wound was Blister. The wound is located on the Left,Medial Ankle. The wound measures 1.9cm length x 2cm width x 0.1cm depth; 2.985cm^2 area and 0.298cm^3 volume. There is Fat Layer (Subcutaneous Tissue) Exposed exposed. There is no tunneling or undermining noted. There is a small amount of serosanguineous drainage noted. The wound margin is distinct with the outline attached to the wound base. There is large (67-100%) pink granulation within the wound bed. There is a small (1-33%) amount of necrotic tissue within the wound bed including Adherent Slough. Wound #59 status is Open. Original cause of wound was Gradually Appeared. The wound is located on the Left,Medial Lower Leg. The wound measures 6cm length x 5.1cm width x 0.1cm depth; 24.033cm^2 area and 2.403cm^3 volume. There is no tunneling or undermining noted. There is a medium amount of serosanguineous drainage noted. The wound margin is distinct with the outline attached to the wound base. There is large (67-100%) red granulation within the wound bed. There is no necrotic tissue within the wound bed. Wound #60 status is Open. Original cause of wound was Gradually Appeared. The wound is located on the Left,Posterior Lower Leg. The wound measures 4.2cm length x 6cm width x 0.1cm depth; 19.792cm^2 area and 1.979cm^3 volume. There is Fat Layer (Subcutaneous Tissue) Exposed exposed. There is no tunneling or undermining noted. There is a medium amount of serosanguineous drainage noted. The wound margin is distinct with the outline attached to the wound base. There is large (67-100%) red, pink  granulation within the wound bed. There is a small (1-33%) amount of necrotic tissue within the wound bed including Adherent Slough. Wound #61 status is Open. Original cause of wound was Gradually Appeared. The wound is located on the Left,Anterior Lower Leg. The wound measures 6cm length x 2.7cm width x 0.1cm depth; 12.723cm^2 area and 1.272cm^3 volume. There is Fat Layer (Subcutaneous Tissue) Exposed exposed. There is no tunneling or undermining noted. There is a medium amount of serosanguineous drainage noted. The wound margin is flat and intact. There is medium (34-66%) pink granulation within the wound bed. There is a medium (34-66%) amount of necrotic tissue within the wound bed including Adherent Slough. Assessment Active Problems ICD-10 Type 2 diabetes mellitus with other skin ulcer Lymphedema, not elsewhere classified Chronic venous hypertension (idiopathic) with ulcer and inflammation of right lower extremity Chronic venous hypertension (idiopathic) with ulcer and inflammation of left lower extremity 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 leg with fat layer exposed Non-pressure chronic ulcer of other part of left lower leg with fat layer exposed Essential (primary) hypertension Morbid (severe) obesity due to excess calories Other specified anxiety disorders Weakness Procedures Wound #59 Pre-procedure diagnosis of Wound #59 is a Diabetic Wound/Ulcer of the Lower Extremity located on the Left,Medial Lower Leg . There was a Three Layer Compression Therapy Procedure by Deon Pilling, RN. Post procedure Diagnosis Wound #59: Same as Pre-Procedure Plan Follow-up Appointments: Return appointment in 3 weeks. Dressing Change Frequency: Wound #57 Left,Dorsal Foot: Change dressing three times week. Wound Cleansing: Wound #57 Left,Dorsal Foot: May shower with protection. - left leg Primary Wound Dressing: Wound #57 Left,Dorsal Foot:  Calcium Alginate with  Silver Other: - HOLD Gentamicin ointment to wound bed under alginate Wound #58 Left,Medial Ankle: Calcium Alginate with Silver Other: - HOLD Gentamicin ointment to wound bed under alginate Wound #59 Left,Medial Lower Leg: Calcium Alginate with Silver Other: - HOLD Gentamicin ointment to wound bed under alginate Wound #60 Left,Posterior Lower Leg: Calcium Alginate with Silver Other: - HOLD Gentamicin ointment to wound bed under alginate Wound #61 Left,Anterior Lower Leg: Calcium Alginate with Silver Other: - HOLD Gentamicin ointment to wound bed under alginate Secondary Dressing: Dry Gauze - all wounds ABD pad - as needed Edema Control: 3 Layer Compression System - Left Lower Extremity Avoid standing for long periods of time - walking is encouraged Elevate legs to the level of the heart or above for 30 minutes daily and/or when sitting, a frequency of: - do not sleep in chair with feet dangling Exercise regularly Support Garment 20-30 mm/Hg pressure to: - Juxtalite to right lower leg daily Segmental Compressive Device. - lymphedema pumps 60 minutes 1- 2 times per day Additional Orders / Instructions: Follow Nutritious Diet - To include vitamin A, vitamin C, and Zinc along with increased protein intake. Home Health: Continue Home Health skilled nursing for wound care. - [facility]. My suggestion currently is good to be that we discontinue the gentamicin for the time being. 2. I would recommend that we continue with the silver alginate dressings at this point as I think that will help to dry things out quite a bit. 3. We will also continue with a 3 layer compression wrap on the left lower extremity and she will use her juxta lite on the right lower extremity. 4. I did explain to the patient she needs to try to walk around she also needs to elevate her legs and not spend as much time in the [facility] chair that skin to be I think is more detriment to her if she continues to do  such. We will see patient back for reevaluation in 3 weeks here in the clinic. If anything worsens or changes patient will contact our office for additional recommendations. Electronic Signature(s) Signed: 04/27/2019 5:02:14 PM By: Lenda Kelp PA-C Entered By: Lenda Kelp on 04/27/2019 10:48:16 -------------------------------------------------------------------------------- HxROS Details Patient Name: Date of Service: [name]. 04/27/2019 9:30 AM Medical Record [id number] Patient Account Number: [id number] Date of Birth/Sex: Treating RN: 1951-02-21 (67 y.o. [name] Primary Care Provider: Fatima Sanger Other Clinician: Referring Provider: Treating Provider/Extender:Stone III, Flo Shanks, FRED Weeks in Treatment: 67 Information Obtained From Patient Constitutional Symptoms (General Health) Complaints and Symptoms: Negative for: Fatigue; Fever; Chills; Marked Weight Change Medical History: Past Medical History Notes: morbid obesity, wheelchair dependent Respiratory Complaints and Symptoms: Negative for: Chronic or frequent coughs; Shortness of Breath Medical History: Positive for: Asthma Negative for: Aspiration; Chronic Obstructive Pulmonary Disease (COPD); Pneumothorax; Sleep Apnea; Tuberculosis Cardiovascular Complaints and Symptoms: Negative for: Chest pain Medical History: Positive for: Hypertension; Peripheral Arterial Disease; Peripheral Venous Disease - chronic Negative for: Angina; Arrhythmia; Congestive Heart Failure; Coronary Artery Disease; Deep Vein Thrombosis; Hypotension; Myocardial Infarction; Phlebitis; Vasculitis Psychiatric Complaints and Symptoms: Negative for: Claustrophobia; Suicidal Medical History: Negative for: Anorexia/bulimia; Confinement Anxiety Past Medical History Notes: insomnia , adjustment disorder, depression with anxiety Eyes Medical History: Negative for: Cataracts; Glaucoma; Optic  Neuritis Ear/Nose/Mouth/Throat Medical History: Negative for: Chronic sinus problems/congestion; Middle ear problems Hematologic/Lymphatic Medical History: Negative for: Anemia; Hemophilia; Human Immunodeficiency Virus; Lymphedema; Sickle Cell Disease Endocrine Medical History: Positive for: Type II Diabetes Time  with diabetes: 2005 Treated with: Insulin Blood sugar tested every day: No Blood sugar testing results: Breakfast: 155; Lunch: 155; Dinner: 160; Bedtime: 155 Genitourinary Medical History: Negative for: End Stage Renal Disease Immunological Medical History: Negative for: Lupus Erythematosus; Raynauds; Scleroderma Integumentary (Skin) Medical History: Past Medical History Notes: LLL cellulitis , wound cx grew E. Coli Musculoskeletal Medical History: Positive for: Gout; Osteoarthritis Negative for: Rheumatoid Arthritis; Osteomyelitis Neurologic Medical History: Negative for: Dementia; Neuropathy; Quadriplegia; Paraplegia; Seizure Disorder Immunizations Pneumococcal Vaccine: Received Pneumococcal Vaccination: Yes Implantable Devices No devices added Hospitalization / Surgery History Type of Hospitalization/Surgery leg cellulitis Family and Social History Cancer: Yes - Siblings; Diabetes: No; Heart Disease: No; Hereditary Spherocytosis: No; Hypertension: Yes - Siblings; Kidney Disease: No; Lung Disease: No; Seizures: No; Stroke: Yes - Father; Thyroid Problems: No; Tuberculosis: No; Never smoker; Marital Status - Married; Alcohol Use: Never; Drug Use: No History; Caffeine Use: Never; Financial Concerns: No; Food, Clothing or Shelter Needs: No; Support System Lacking: No; Transportation Concerns: No Physician Affirmation I have reviewed and agree with the above information. Electronic Signature(s) Signed: 04/27/2019 5:02:14 PM By: Lenda Kelp PA-C Signed: 04/27/2019 5:49:26 PM By: Zenaida Deed RN, BSN Entered By: Lenda Kelp on 04/27/2019  10:46:47 -------------------------------------------------------------------------------- SuperBill Details Patient Name: Date of Service: [name] 04/27/2019 Medical Record [id number] Patient Account Number: [id number] Date of Birth/Sex: Treating RN: [birth year] (67 y.o. [name] Primary Care Provider: Fatima Sanger Other Clinician: Referring Provider: Treating Provider/Extender:Stone III, Flo Shanks, FRED Weeks in Treatment: 48 Diagnosis Coding ICD-10 Codes Code Description E11.622 Type 2 diabetes mellitus with other skin ulcer I89.0 Lymphedema, not elsewhere classified I87.331 Chronic venous hypertension (idiopathic) with ulcer and inflammation of right lower extremity I87.332 Chronic venous hypertension (idiopathic) with ulcer and inflammation of left lower extremity L97.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 leg with fat layer exposed L97.822 Non-pressure chronic ulcer of other part of left lower leg with fat layer exposed I10 Essential (primary) hypertension E66.01 Morbid (severe) obesity due to excess calories F41.8 Other specified anxiety disorders R53.1 Weakness Facility Procedures CPT4 Code Description: [id number] (Facility Use Only) [phone] - APPLY MULTLAY COMPRS LWR LT LEG Modifier: Quantity: 1 Physician Procedures CPT4 Code Description: [id number] - WC PHYS LEVEL 4 - EST PT ICD-10 Diagnosis Description E11.622 Type 2 diabetes mellitus with other skin ulcer I89.0 Lymphedema, not elsewhere classified I87.331 Chronic venous hypertension (idiopathic) with ulcer and  lower extremity I87.332 Chronic venous hypertension (idiopathic) with ulcer and extremity Modifier: inflammation of inflammation of Quantity: 1 right left lower Electronic Signature(s) Signed: 04/27/2019 5:02:14 PM By: Lenda Kelp PA-C Entered By: Lenda Kelp on 04/27/2019 10:48:37

## 2019-05-13 ENCOUNTER — Emergency Department (HOSPITAL_COMMUNITY)
Admission: EM | Admit: 2019-05-13 | Discharge: 2019-05-13 | Disposition: A | Discharge status: Home / Self Care | Payer: Medicare PPO | Attending: Emergency Medicine | Admitting: Emergency Medicine

## 2019-05-13 ENCOUNTER — Encounter (HOSPITAL_COMMUNITY): Payer: Self-pay | Admitting: Emergency Medicine

## 2019-05-13 ENCOUNTER — Other Ambulatory Visit: Payer: Self-pay

## 2019-05-13 DIAGNOSIS — Z79899 Other long term (current) drug therapy: Secondary | ICD-10-CM | POA: Insufficient documentation

## 2019-05-13 DIAGNOSIS — L0889 Other specified local infections of the skin and subcutaneous tissue: Secondary | ICD-10-CM | POA: Insufficient documentation

## 2019-05-13 DIAGNOSIS — Z7982 Long term (current) use of aspirin: Secondary | ICD-10-CM | POA: Insufficient documentation

## 2019-05-13 DIAGNOSIS — I878 Other specified disorders of veins: Secondary | ICD-10-CM

## 2019-05-13 DIAGNOSIS — E1151 Type 2 diabetes mellitus with diabetic peripheral angiopathy without gangrene: Secondary | ICD-10-CM | POA: Insufficient documentation

## 2019-05-13 DIAGNOSIS — L089 Local infection of the skin and subcutaneous tissue, unspecified: Secondary | ICD-10-CM

## 2019-05-13 DIAGNOSIS — Z794 Long term (current) use of insulin: Secondary | ICD-10-CM | POA: Insufficient documentation

## 2019-05-13 DIAGNOSIS — I872 Venous insufficiency (chronic) (peripheral): Secondary | ICD-10-CM | POA: Diagnosis not present

## 2019-05-13 LAB — COMPREHENSIVE METABOLIC PANEL
ALT: 16 U/L (ref 0–44)
AST: 15 U/L (ref 15–41)
Albumin: 3.3 g/dL — ABNORMAL LOW (ref 3.5–5.0)
Alkaline Phosphatase: 106 U/L (ref 38–126)
Anion gap: 13 (ref 5–15)
BUN: 13 mg/dL (ref 8–23)
CO2: 23 mmol/L (ref 22–32)
Calcium: 9.7 mg/dL (ref 8.9–10.3)
Chloride: 103 mmol/L (ref 98–111)
Creatinine, Ser: 0.98 mg/dL (ref 0.44–1.00)
GFR calc Af Amer: >60 mL/min (ref >60)
GFR calc non Af Amer: 59 mL/min — ABNORMAL LOW (ref >60)
Glucose, Bld: 108 mg/dL — ABNORMAL HIGH (ref 70–99)
Potassium: 3.7 mmol/L (ref 3.5–5.1)
Sodium: 139 mmol/L (ref 135–145)
Total Bilirubin: 0.5 mg/dL (ref 0.3–1.2)
Total Protein: 9.6 g/dL — ABNORMAL HIGH (ref 6.5–8.1)

## 2019-05-13 LAB — CBC WITH DIFFERENTIAL/PLATELET
Abs Immature Granulocytes: 0.02 10*3/uL (ref 0.00–0.07)
Basophils Absolute: 0 10*3/uL (ref 0.0–0.1)
Basophils Relative: 1 %
Eosinophils Absolute: 0.3 10*3/uL (ref 0.0–0.5)
Eosinophils Relative: 4 %
HCT: 41.5 % (ref 36.0–46.0)
Hemoglobin: 12.9 g/dL (ref 12.0–15.0)
Immature Granulocytes: 0 %
Lymphocytes Relative: 22 %
Lymphs Abs: 1.9 10*3/uL (ref 0.7–4.0)
MCH: 28.7 pg (ref 26.0–34.0)
MCHC: 31.1 g/dL (ref 30.0–36.0)
MCV: 92.4 fL (ref 80.0–100.0)
Monocytes Absolute: 0.5 10*3/uL (ref 0.1–1.0)
Monocytes Relative: 6 %
Neutro Abs: 6 10*3/uL (ref 1.7–7.7)
Neutrophils Relative %: 67 %
Platelets: 364 10*3/uL (ref 150–400)
RBC: 4.49 MIL/uL (ref 3.87–5.11)
RDW: 15.9 % — ABNORMAL HIGH (ref 11.5–15.5)
WBC: 8.9 10*3/uL (ref 4.0–10.5)
nRBC: 0 % (ref 0.0–0.2)

## 2019-05-13 LAB — LACTIC ACID, PLASMA: Lactic Acid, Venous: 1.6 mmol/L (ref 0.5–1.9)

## 2019-05-13 MED ORDER — SODIUM CHLORIDE 0.9% FLUSH: 3.0000 mL | Freq: Once | INTRAVENOUS | Status: DC

## 2019-05-13 MED ORDER — DOXYCYCLINE HYCLATE 100 MG PO TABS
100.0000 mg | ORAL_TABLET | Freq: Once | ORAL | Status: AC
  Administered 2019-05-13: 100 mg via ORAL
  Filled 2019-05-13: qty 1

## 2019-05-13 MED ORDER — DOXYCYCLINE HYCLATE 100 MG PO CAPS: 100.0000 mg | ORAL_CAPSULE | Freq: Two times a day (BID) | ORAL | 0 refills | Status: DC

## 2019-05-13 NOTE — Discharge Instructions (Addendum)
Take doxycycline as prescribed until completed.  Please have the wounds rechecked in 2-3 days.  Please return to the emergency department if you develop persistent fever of 100.4, increasing redness and drainage, increasing pain, or any other concerning symptoms.

## 2019-05-13 NOTE — ED Notes (Signed)
Discharge instructions reviewed with pt. Pt verbalized understanding.   

## 2019-05-13 NOTE — ED Provider Notes (Signed)
[facility] EMERGENCY DEPARTMENT Provider Note   CSN: [id number] Arrival date & time: 05/13/19  1819     History   Chief Complaint Chief Complaint  Patient presents with  . Recurrent Skin Infections    HPI [name] is a 68 y.o. female who presents with acute on chronic drainage from chronic venous stasis wound to the left lower extremity.  Patient denies any fevers or significant pain.  Her home health nurse noticed that there was some yellow, malodorous drainage.  Patient denies any other symptoms including chest pain, shortness of breath, abdominal pain, nausea, vomiting.  No other interventions outside regular wound care completed.     HPI  Past Medical History:  Diagnosis Date  . Anxiety   . Arthritis    "back, arms, legs" (03/24/2016)  . Asthma   . Chronic lower back pain   . Colonic polyp    last colonoscopy done in 2009 with normal results per medical record  . Depressive disorder   . Gastric polyp   . Gout    has taken allopurinol 336m once daily in past  . Headache   . History of hiatal hernia   . Hypertension   . Migraine    "none in awhile; might have a couple/year" (03/24/2016)  . Mixed hyperlipidemia    01/2016 Total chol 141, HDL 59, LDL 63, ration 1.1  . Osteoarthritis   . TIA (transient ischemic attack) 11/2014  . Type II diabetes mellitus ([facility]     Patient Active Problem List   Diagnosis Date Noted  . Diabetic foot ulcer associated with type 2 diabetes mellitus ([place]   . Cellulitis 07/05/2016  . Depression with anxiety 05/30/2016  . Chronic skin ulcer of lower leg ([place] 05/30/2016  . Generalized weakness 05/30/2016  . Left arm weakness 05/30/2016  . Left Lower extremity pain  04/02/2016  . Lower extremity pain, inferior, left 04/02/2016  . Left leg pain   . Personal history of noncompliance with medical treatment, presenting hazards to health 03/31/2016  . Jaw pain 03/27/2016  . Left leg cellulitis 03/24/2016  .  Cellulitis of left lower extremity   . Primary insomnia   . Adjustment disorder   . Morbid obesity ([place] 01/27/2016  . Wheelchair dependent 01/27/2016  . Gout 01/27/2016  . Asthma 01/27/2016  . Arthritis 01/27/2016  . Uncontrolled diabetes mellitus type 2 with peripheral artery disease ([facility] 01/25/2016  . Cellulitis of left leg 01/11/2016  . Insulin-requiring or dependent type II diabetes mellitus ([place] 01/11/2016  . Hypertension 01/11/2016    Past Surgical History:  Procedure Laterality Date  . ARTERY BIOPSY Right 08/12/2016   Procedure: BIOPSY TEMPORAL ARTERY;  Surgeon: CElam [name] MD;  [facility] [facility]  Service: Vascular;  Laterality: Right;  BIOPSY TEMPORAL ARTERY  . BREAST BIOPSY Left ~ 2015   benign  . CARPAL TUNNEL RELEASE Bilateral   . DILATION AND CURETTAGE OF UTERUS    . [place]LE[place]OM[place]    . TUBAL LIGATION    . VAGINAL HYSTERECTOMY  1982     OB History   No obstetric history on file.      Home Medications    Prior to Admission medications   Medication Sig Start Date End Date Taking? Authorizing Provider  ACCU-CHEK FASTCLIX LANCETS MISC 1 each as needed. 03/20/17   [provider]  ACCU-CHEK SMARTVIEW test strip 1 each 4 (four) times daily. for testing  06/15/17   [provider]  acetaminophen (TYLENOL) 500 MG tablet Take 500 mg by mouth daily as needed for moderate pain.    [provider]  albuterol (PROVENTIL HFA;VENTOLIN HFA) 108 (90 Base) MCG/ACT inhaler Inhale 2 puffs into the lungs every 4 (four) hours as needed for wheezing or shortness of breath.     [provider]  allopurinol (ZYLOPRIM) 300 MG tablet Take 300 mg by mouth daily.    [provider]  aspirin 325 MG tablet Take 325 mg by mouth daily.    [provider]  atenolol (TENORMIN) 25 MG tablet Take 25-50 mg by mouth 2 (two) times daily. Take 50 mg every morning and then 25 mg every  evening    [provider]  Blood Glucose Monitoring Suppl (ACCU-CHEK NANO SMARTVIEW) w/Device KIT 1 each See admin instructions. 05/23/17   [provider]  Cholecalciferol (VITAMIN D3) 2000 units capsule Take 2,000 Units by mouth daily.    [provider]  cyclobenzaprine (FLEXERIL) 10 MG tablet Take 10 mg by mouth at bedtime as needed for muscle spasms.     [provider]  diclofenac sodium (VOLTAREN) 1 % GEL Apply 4 g topically 4 (four) times daily. Patient taking differently: Apply 4 g topically 4 (four) times daily as needed (pain).  02/25/16   Henson, [name] L, NP-C  Diclofenac Sodium CR (VOLTAREN-XR) 100 MG 24 hr tablet Take 1 tablet (100 mg total) by mouth daily. 09/09/17   Palumbo, April, MD  doxycycline (VIBRAMYCIN) 100 MG capsule Take 1 capsule (100 mg total) by mouth 2 (two) times daily. 05/13/19   Zakariyah Freimark, Bea Graff, PA-C  furosemide (LASIX) 20 MG tablet Take 1 tablet (20 mg total) by mouth daily. 07/07/16   Thurnell Lose, MD  gabapentin (NEURONTIN) 300 MG capsule Take 1 capsule (300 mg total) by mouth 2 (two) times daily. Patient taking differently: Take 600 mg by mouth 2 (two) times daily.  01/31/16   Henson, [name] L, NP-C  hydrocerin (EUCERIN) CREA Apply 1 application topically daily. 01/15/16   Thurnell Lose, MD  HYDROcodone-acetaminophen (NORCO/VICODIN) 5-325 MG tablet Take 1-2 tablets by mouth every 6 (six) hours as needed. Patient not taking: Reported on 08/11/2017 02/26/17   Ward, Delice Bison, DO  insulin degludec (TRESIBA FLEXTOUCH) 100 UNIT/ML SOPN FlexTouch Pen Inject 0.75 mLs (75 Units total) into the skin daily. Patient taking differently: Inject 75 Units into the skin every morning.  03/14/16   Henson, [name] L, NP-C  magnesium gluconate (MAGONATE) 500 MG tablet Take 500 mg by mouth daily.    [provider]  NIFEdipine (PROCARDIA XL/ADALAT-CC) 90 MG 24 hr tablet Take 90 mg by mouth daily.    [provider]  NOVOFINE 32G  X 6 MM MISC 1 each as needed. 08/09/17   [provider]  NOVOLOG FLEXPEN 100 UNIT/ML FlexPen Take 17 Units by mouth 2 (two) times daily. 01/30/17   [provider]  polyethylene glycol (MIRALAX / GLYCOLAX) packet Take 17 g by mouth daily as needed for moderate constipation.    [provider]  potassium chloride (K-DUR) 10 MEQ tablet Take 1 tablet (10 mEq total) by mouth daily. 07/07/16   Thurnell Lose, MD  protein supplement shake (PREMIER PROTEIN) LIQD Take 325 mLs (11 oz total) by mouth daily. 03/18/17   Eugenie Filler, MD    Family History Family History  Problem Relation Age of Onset  . Stroke Father   . Stroke Brother   .  Cancer Other     Social History Social History   Tobacco Use  . Smoking status: Never Smoker  . Smokeless tobacco: Never Used  Substance Use Topics  . Alcohol use: No  . Drug use: No     Allergies   Ace inhibitors, Tizanidine, Metformin and related, and Propofol   Review of Systems Review of Systems  Constitutional: Negative for chills and fever.  HENT: Negative for facial swelling and sore throat.   Respiratory: Negative for shortness of breath.   Cardiovascular: Negative for chest pain.  Gastrointestinal: Negative for abdominal pain, nausea and vomiting.  Genitourinary: Negative for dysuria.  Musculoskeletal: Negative for back pain.  Skin: Positive for wound. Negative for rash.  Neurological: Negative for headaches.  Psychiatric/Behavioral: The patient is not nervous/anxious.      Physical Exam Updated Vital Signs BP 119/77   Pulse (!) 105   Temp 98.7 F (37.1 C) (Oral)   Resp 20   Ht 5' 5"  (1.651 m)   Wt (!) 141.5 kg   SpO2 99%   BMI 51.92 kg/m   Physical Exam Vitals signs and nursing note reviewed.  Constitutional:      General: She is not in acute distress.    Appearance: She is well-developed. She is not diaphoretic.  HENT:     Head: Normocephalic and atraumatic.     Mouth/Throat:      Pharynx: No oropharyngeal exudate.  Eyes:     General: No scleral icterus.       Right eye: No discharge.        Left eye: No discharge.     Conjunctiva/sclera: Conjunctivae normal.     Pupils: Pupils are equal, round, and reactive to light.  Neck:     Musculoskeletal: Normal range of motion and neck supple.     Thyroid: No thyromegaly.  Cardiovascular:     Rate and Rhythm: Regular rhythm. Tachycardia present.     Heart sounds: Normal heart sounds. No murmur. No friction rub. No gallop.   Pulmonary:     Effort: Pulmonary effort is normal. No respiratory distress.     Breath sounds: Normal breath sounds. No stridor. No wheezing or rales.  Abdominal:     General: Bowel sounds are normal. There is no distension.     Palpations: Abdomen is soft.     Tenderness: There is no abdominal tenderness. There is no guarding or rebound.  Lymphadenopathy:     Cervical: No cervical adenopathy.  Skin:    General: Skin is warm and dry.     Coloration: Skin is not pale.     Findings: No rash.     Comments: Significant chronic skin changes to the left lower extremity with small ulcerative lesion with minimal yellow drainage Some skin changes on the right lower extremity, but not near as significant as the left, see photo  Neurological:     Mental Status: She is alert.     Coordination: Coordination normal.        ED Treatments / Results  Labs (all labs ordered are listed, but only abnormal results are displayed) Labs Reviewed  COMPREHENSIVE METABOLIC PANEL - Abnormal; Notable for the following components:      Result Value   Glucose, Bld 108 (*)    Total Protein 9.6 (*)    Albumin 3.3 (*)    GFR calc non Af Amer 59 (*)    All other components within normal limits  CBC WITH DIFFERENTIAL/PLATELET - Abnormal; Notable for the  following components:   RDW 15.9 (*)    All other components within normal limits  LACTIC ACID, PLASMA  LACTIC ACID, PLASMA    EKG None  Radiology No results  found.  Procedures Procedures (including critical care time)  Medications Ordered in ED Medications  sodium chloride flush (NS) 0.9 % injection 3 mL (has no administration in time range)  doxycycline (VIBRA-TABS) tablet 100 mg (100 mg Oral Given 05/13/19 2140)     Initial Impression / Assessment and Plan / ED Course  I have reviewed the triage vital signs and the nursing notes.  Pertinent labs & imaging results that were available during my care of the patient were reviewed by me and considered in my medical decision making (see chart for details).        Patient with stable labs.  She is afebrile.  Very mildly tachycardic.  Doubt sepsis at this time.  Lactic within normal limits.  No leukocytosis.  Patient is otherwise feeling well.  I think it is reasonable to trial outpatient antibiotics with doxycycline.  Wound check in 2 to 3 days.  Return precautions discussed.  Patient understands agrees with plan.  Patient also guided by my attending, Dr. Roslynn Amble, who guided the patient's management and agrees with plan.  Final Clinical Impressions(s) / ED Diagnoses   Final diagnoses:  Venous stasis  Skin infection    ED Discharge Orders         Ordered    doxycycline (VIBRAMYCIN) 100 MG capsule  2 times daily     05/13/19 2120           Frederica Kuster, PA-C 05/13/19 2234    Lucrezia Starch, MD 05/16/19 1153

## 2019-05-13 NOTE — ED Triage Notes (Signed)
Pt sent by home health nurse, c/o swelling, oozing and a foul odor from LLE. Extremity wrapped at this time.

## 2019-05-18 ENCOUNTER — Other Ambulatory Visit: Payer: Self-pay

## 2019-05-18 ENCOUNTER — Encounter (HOSPITAL_BASED_OUTPATIENT_CLINIC_OR_DEPARTMENT_OTHER): Payer: Medicare PPO | Attending: Physician Assistant | Admitting: Physician Assistant

## 2019-05-18 DIAGNOSIS — I87311 Chronic venous hypertension (idiopathic) with ulcer of right lower extremity: Secondary | ICD-10-CM | POA: Insufficient documentation

## 2019-05-18 DIAGNOSIS — E1140 Type 2 diabetes mellitus with diabetic neuropathy, unspecified: Secondary | ICD-10-CM | POA: Insufficient documentation

## 2019-05-18 DIAGNOSIS — M1990 Unspecified osteoarthritis, unspecified site: Secondary | ICD-10-CM | POA: Diagnosis not present

## 2019-05-18 DIAGNOSIS — G43909 Migraine, unspecified, not intractable, without status migrainosus: Secondary | ICD-10-CM | POA: Diagnosis not present

## 2019-05-18 DIAGNOSIS — Z823 Family history of stroke: Secondary | ICD-10-CM | POA: Insufficient documentation

## 2019-05-18 DIAGNOSIS — L97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 leg with fat layer exposed: Secondary | ICD-10-CM | POA: Insufficient documentation

## 2019-05-18 DIAGNOSIS — I69351 Hemiplegia and hemiparesis following cerebral infarction affecting right dominant side: Secondary | ICD-10-CM | POA: Diagnosis not present

## 2019-05-18 DIAGNOSIS — F329 Major depressive disorder, single episode, unspecified: Secondary | ICD-10-CM | POA: Diagnosis not present

## 2019-05-18 DIAGNOSIS — Z809 Family history of malignant neoplasm, unspecified: Secondary | ICD-10-CM | POA: Insufficient documentation

## 2019-05-18 DIAGNOSIS — L97522 Non-pressure chronic ulcer of other part of left foot with fat layer exposed: Secondary | ICD-10-CM | POA: Insufficient documentation

## 2019-05-18 DIAGNOSIS — Z90710 Acquired absence of both cervix and uterus: Secondary | ICD-10-CM | POA: Insufficient documentation

## 2019-05-18 DIAGNOSIS — M109 Gout, unspecified: Secondary | ICD-10-CM | POA: Insufficient documentation

## 2019-05-18 DIAGNOSIS — Z8249 Family history of ischemic heart disease and other diseases of the circulatory system: Secondary | ICD-10-CM | POA: Diagnosis not present

## 2019-05-18 DIAGNOSIS — L97822 Non-pressure chronic ulcer of other part of left lower leg with fat layer exposed: Secondary | ICD-10-CM | POA: Diagnosis not present

## 2019-05-18 DIAGNOSIS — K449 Diaphragmatic hernia without obstruction or gangrene: Secondary | ICD-10-CM | POA: Diagnosis not present

## 2019-05-18 DIAGNOSIS — I10 Essential (primary) hypertension: Secondary | ICD-10-CM | POA: Diagnosis not present

## 2019-05-18 DIAGNOSIS — E11622 Type 2 diabetes mellitus with other skin ulcer: Secondary | ICD-10-CM | POA: Diagnosis not present

## 2019-05-18 DIAGNOSIS — E11621 Type 2 diabetes mellitus with foot ulcer: Secondary | ICD-10-CM | POA: Diagnosis not present

## 2019-05-18 DIAGNOSIS — J45909 Unspecified asthma, uncomplicated: Secondary | ICD-10-CM | POA: Diagnosis not present

## 2019-05-18 DIAGNOSIS — Z993 Dependence on wheelchair: Secondary | ICD-10-CM | POA: Insufficient documentation

## 2019-05-18 DIAGNOSIS — I890 Lymphedema, not elsewhere classified: Secondary | ICD-10-CM | POA: Insufficient documentation

## 2019-05-18 DIAGNOSIS — M5432 Sciatica, left side: Secondary | ICD-10-CM | POA: Insufficient documentation

## 2019-05-19 ENCOUNTER — Encounter (HOSPITAL_BASED_OUTPATIENT_CLINIC_OR_DEPARTMENT_OTHER): Payer: Medicare PPO | Admitting: Internal Medicine

## 2019-05-19 NOTE — Progress Notes (Signed)
[name], [name] ([id number]) Visit Report for 05/18/2019 Chief Complaint Document Details Patient Name: Date of Service: [name], [name] 05/18/2019 1:00 PM Medical Record [id number] Patient Account Number: [id number] Date of Birth/Sex: Treating RN: 08-25-1950 (68 y.o. [name] Primary Care Provider: Fatima Sanger Other Clinician: Referring Provider: Treating Provider/Extender:[name] Weeks in Treatment: 31 Information Obtained from: Patient Chief Complaint Bilateral reoccurring LE ulcers Electronic Signature(s) Signed: 05/19/2019 10:36:32 AM By: Lenda Kelp PA-C Entered By: Lenda Kelp on 05/18/2019 13:39:34 -------------------------------------------------------------------------------- HPI Details Patient Name: Date of Service: [name]. 05/18/2019 1:00 PM Medical Record [id number] Patient Account Number: [id number] Date of Birth/Sex: Treating RN: 1951-02-09 (68 y.o. [name] Primary Care Provider: Fatima Sanger Other Clinician: Referring Provider: Treating Provider/Extender:[name] Weeks in Treatment: 24 History of Present Illness HPI Description: this patient has been seen a couple of times before and returns with recurrent problems to her right and left lower extremity with swelling and weeping ulcerations due to not wearing her compression stockings which she had been advised to do during her last discharge, at the end of June 2018. During her last visit the patient had had normal arterial blood flow and her venous reflux study did not necessitate any surgical intervention. She was recommended compression and elevation and wound care. After prolonged treatment the patient was completely healed but she has been noncompliant with wearing or compressions.. She was here last week with an outpatient return visit planned but the patient came in a very poor general condition with altered mental  status and was rushed to the ER on my request. With a history of hypertension, diabetes, TIA and right-sided weakness she was set up for an MRI on her brain and cervical spine and was sent to [name]. Getting an MRI done was very difficult but once the workup was done she was found not to have any spinal stenosis, epidural abscess or hematoma or discitis. This was radiculopathy to be treated as an outpatient and she was given a follow-up appointment. Today she is feeling much better alert and oriented and has come to reevaluate her bilateral lower extremity lymphedema and ulceration 03/25/2017 -- she was admitted to the hospital on 03/16/2017 and discharged on 03/18/2017 with left leg cellulitis and ulceration. She was started on vancomycin and Zosyn and x-ray showed no bony involvement. She was treated for a cellulitis with IV antibiotics changed to Rocephin and Flagyl and was discharged on oral Keflex and doxycycline to complete a 7 day course. Last hemoglobin A1c was 7.1 and her other ailments including hypertension got asthma were appropriately treated. 05/06/2017 -- she is awaiting the right size of compression stockings from [place] but other than that has been doing well. ====== Old notes 68 year old patient was seen one time last October and was lost to follow-up. She has recurrent problems with weeping and ulceration of her left lower extremity and has swelling of this for several years. It has been worse for the last 2 months. Past medical history is significant for diabetes mellitus type 2, hypertension, gout, morbid obesity, depressive disorders, hiatal hernia, migraines, status post knee surgery, risk of a cholecystectomy, vaginal hysterectomy and breast biopsy. She is not a smoker. As noted before she has never had a venous duplex study and an arterial ABI study was attempted but the left lower extremity was noncompressible 10/01/2016 -- had a lower extremity venous duplex  reflux evaluation which showed no evidence of deep vein reflux  in the right or left lower extremity, and no evidence of great saphenous vein reflux more than 500 ms in the right or left lower extremity, and the left small saphenous vein is incompetent but no vascular consult was recommended. review of her electronic medical records noted that the ABI was checked in July 2017 where the right ABI was normal limits and the left ABI could not be ascertained due to pain with cuff pressure but the waveforms are within normal limits. her arterial duplex study scheduled for April 27. 10/08/2016 -- the patient has various reasons for not having a compression on and for the last 3 days she has had no compression on her left lower extremity either due to pain or the lack of nursing help. She does not use her juxta lites either. 10/15/2016 -- the patient did not keep her appointment for arterial duplex study on April 27 and I have asked her to reschedule this. Her pain is out of proportion with the physical findings and she continuously fails to wear a compression wraps and cuts them off because she says she cannot tolerate the pain. She does not use her juxta lites either. 10/22/2016 -- he has rescheduled her arterial duplex study to May 21 and her pain today is a bit better. She has not been wearing her juxta lites on her right lower extremity but now understands that she needs to do this. She did tolerate the to press compression wrap on her left lower extremity 10/29/2016 --arterial duplex study is scheduled for next week and overall she has been tolerating her compression wraps and also using her juxta lites on her right lower extremity 11/05/2016 -- the right ABI was 0.95 the left was 1.03. The digit TBI is on the right was 0.83 on the left was 0.92 and she had biphasic flow through these vessels. The impression was that of normal lower extremity arterial study. 11/12/2016 -- her pain is minimal and  she is doing very well overall. 11/26/2016 -- she has got juxta lites and her insurance will not pay for additional dual layer compression stockings. She is going to order some from [place]. 05/12/2017 -- her juxta lites are very old and too big for her and these have not been helping with compression. She did get 20-30 mm compression stockings from [place] but she and her husband are unable to put these on. I believe she will benefit from bilateral Extremit-ease, compression stockings and we will measure her for these today. 05/20/2017 -- lymphedema on the left lower extremity has increased a lot and she has a open ulceration as a result of this. The right lower extremity is looking pretty good. She has decided to by the compression stockings herself and will get reimbursed by the [name], at a later date. 05/27/2017 -- her sciatica is bothering her a lot and she thought her left leg pain was caused due to the compression wrap and hence removed it and has significant lymphedema. There is no inflammation on this left lower extremity. 06/17/17 on evaluation today patient appears to be doing very well and in fact is completely healed in regard to her ulcerations. Unfortunately however she does have continued issues with lymphedema nonetheless. We did order compression garments for her unfortunately she states that the size that she received were large although we ordered medium. Obviously this means she is not getting the optimal compression. She does not have those with her today and therefore we could not confirm and contact  the company on her behalf. Nonetheless she does state that she is going to have her husband bring them by tomorrow so that we can verify and then get in touch with the company. No fevers, chills, nausea, or vomiting noted at this time. Overall patient is doing better otherwise and I'm pleased with the progress she has made. 07/01/17 on evaluation today patient appears to  be doing very well in regard to her bilateral lower extremity she does not have any openings at this point which is excellent news. Overall I'm pleased with how things have progressed up to this time. Since she is doing so well we did order her compression which we are seeing her today to ensure that it fits her properly and everything is doing well in that regard and then subsequently she will be discharged. ============ Old Notes: 03/31/16 patient presents today for evaluation concerning open wounds that she has over the left medial ankle region as well as the left dorsal foot. She has previously had this occur although it has been healed for a number of months after having this for about a year prior until her hospitalization on 01/05/16. At that point in time it appears that she was admitted to the hospital for left lower extremity cellulitis and was placed on vancomycin and Zosyn at that point. Eventually upon discharge on January 15, 2016 she was placed on doxycycline at that point in time. Later on 03/27/16 positive wound culture growing Escherichia coli this was switched to amoxicillin. Currently she tells me that she is having pain radiated to be a 7 out of 10 which can be as high as 10 out of 10 with palpation and manipulation of the wound. This wound appears to be mainly venous in nature due to the bilateral lower extremity venous stasis/lymphedema. This is definitely much worse on her left than the right side. She does have type 1 diabetes mellitus, hypertension, morbid obesity, and is wheelchair dependent.during the course of the hospital stay a blood culture was also obtained and fortunately appeared negative. She also had an x-ray of the tibia/fibula on the left which showed no acute bone abnormality. Her white blood cell count which was performed last on 03/25/16 was 7.3, hemoglobin 12.8, protein 7.1, albumin 3.0. Her urine culture appeared to be negative for any specific  organisms. Patient did have a left lower extremity venous duplex evaluation for DVT. This did not include venous reflux studies but fortunately was negative for DVT. Patient also had arterial studies performed which revealed that she had a normal ABI on the right though this was unable to be performed on the left secondary to pain that she was having around the ankle region due to the wound. However it was stated on report that she had biphasic pulses and apparently good blood flow. ========== 06/03/17 she is here in follow-up evaluation for right lower extremity ulcer. The right lower sure he has healed but she has reopened to the left medial malleolus and dorsal foot with weeping. She is waiting for new compression garments to arrive from [name], the previous compression garments were ill fitting. We will continue with compression bilaterally and follow-up in 2 weeks Readmission: 08/05/17 on evaluation today patient appears to be doing somewhat poorly in regard to her left lower extremity especially although the right lower extremity has a small area which may no longer be open. She has been having a lot of drainage from the left lower extremity however he tells me that  she has not been able to use the EXTREMIT- EASE Compression at this point. She states that she did better and was able to actually apply the Juxta-Lite compression although the wound that she has is too large and therefore really does not compress which is why she cannot wear it at this point. She has no one who can help her put it on regular basis her son can sometimes but he's not able to do it most of the time. I do believe that's why she has begun to weave and have issues as she is currently yet again. No fevers, chills, nausea, or vomiting noted at this time. Patient is no evidence of dementia. 08/12/17 on evaluation today patient appears to still be doing fairly well in regard to the draining areas/weeping areas at  this point. With that being said she unfortunately did go to the ER yesterday due to what was felt to be possibly a cellulitis. They place her on doxycycline by mouth and discharge her home. She definitely was not admitted. With that being said she states she has had more discomfort which has been unusual for her even compared to prior times and she's had infections.08/12/17 on evaluation today patient appears to still be doing fairly well in regard to the draining areas/weeping areas at this point. With that being said she unfortunately did go to the ER yesterday due to what was felt to be possibly a cellulitis. They place her on doxycycline by mouth and discharge her home. She definitely was not admitted. With that being said she states she has had more discomfort which has been unusual for her even compared to prior times and she's had infections. 08/19/17 put evaluation today patient tells me that she's been having a lot of what sounds to be neuropathic type pain in regard to her left lower extremity. She has been using over-the-counter topical bins again which some believe. That in order to apply the she actually remove the wrap we put on her last Wednesday on Thursday. Subsequently she has not had anything on compression wise since that time. The good news is a lot of the weeping areas appear to have closed at this point again I believe she would do better with compression but we are struggling to get her to actually use what she needs to at this point. No fevers, chills, nausea, or vomiting noted at this time. 09/03/17 on evaluation today patient appears to be doing okay in regard to her lower extremities in regard to the lymphedema and weeping. Fortunately she does not seem to show any signs of infection at this point she does have a little bit of weeping occurring in the right medial malleolus area. With that being said this does not appear to be too significant which is good news. 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 She has severe skin damage secondary to both of these features involving the dorsal left foot and medial left ankle and lower leg. Still has open areas in the left anterior foot. The area on the right closed over. She uses her own juxta light stockings. She does not have an arterial issue 09/16/17 on evaluation today patient actually appears to be doing excellent in regard to her bilateral lower extremity swelling. The Juxta-Lite compression wrap seem to be doing very well for her. She has not however been using the portion that goes over her foot. Her left foot still is draining a  little bit not nearly as significant as it has been in the past but still I do believe that she likely needs to utilize the full wrap including the foot portion of this will improve as well. She also has been apparently putting on a significant amount of Vaseline which also think is not helpful for her. I recommended that if she feels she needs something for moisturizer Eucerin will probably be better. 09/30/17 on evaluation today patient presents with several new open areas in regard to her left lower extremity although these appear to be minimal and mainly seem to be more moisture breakdown than anything. Fortunately she does not seem to have any evidence of infection which is great news. She has been tolerating the dressing changes without complication we are using silver alginate on the foot she has been using AB pads to have the legs and using her Juxta-Lite compression which seems to be controlling her swelling very well. Overall I'm pleased with the poor way she has progressed. 10/14/17 on evaluation today patient appears to be doing better in regard to her left lower extremity areas of weeping. She does still have some discomfort although in general this does not appear to be as macerated and I think it  is progressing nicely. I do think she still needs to wear the foot portion of her Juxta-Lite in order to get the most benefit from the wrap obviously. She states she understands. Fortunately there does not appear to be evidence of infection at this time which is great news. 10/28/17 on evaluation today patient appears to be doing excellent in regard to her left lower extremity. She has just a couple areas that are still open and seem to be causing any trouble whatsoever. For that reason I think that she is definitely headed in the right direction the spots are very tiny compared to what we have been dealing with in the past. 11/11/17 on evaluation today patient appears to have a right lateral lower extremity ulcer that has opened since I last saw her. She states this is where the [name] nurse that was coming out remove the dressing without wetting the alginate first. Nonetheless I do not know if this is indeed the case or not but more importantly we have not ordered home help to be coming out for her wounds at all. I'm unsure as to why they are coming out and we're gonna have to check on this and get things situated in that regard. With that being said we currently really do not need them to be coming out as the patient has been taking care of her leg herself without complication and no issues. In fact she was doing much better prior to nursing coming out. 11/25/17 on evaluation today patient actually appears to be doing fairly well in regard to her left lower extremity swelling. In fact she has very little area of weeping at this point there's just a small spot on the lateral portion of her right leg that still has me just a little bit more concerned as far as wanting to see this clear up before I discharge her to caring for this at home. Nonetheless overall she has made excellent progress. 12/09/17 on evaluation today patient appears to be doing rather well in regard to her lower extremity  edema. She does have some weeping still in the left lower extremity although the big area we were taking care of two weeks ago actually has closed and she has another  area of weeping on the left lower extremity immediately as well is the top of her foot. She does not currently have lymphedema pumps she has been wearing her compression daily on a regular basis as directed. With that being said I think she may benefit from lymphedema pumps. 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 We had a very difficult time getting and keeping this under control. 12/23/17 on evaluation today patient actually appears to be doing a little bit more poorly in regard to her bilateral lower extremities. She has been tolerating the Juxta-Lite compression wraps. Unfortunately she has two new ulcers on the right lower extremity and left lower Trinity ulceration seems to be larger. Obviously this is not good news. She has been tolerating the dressings without complication. 12/30/17 on evaluation today patient actually appears to be doing much better in regard to her bilateral lower extremity edema. She continues to have some issues with ulcerations and in fact there appears to be one spot on each leg where the wrap may have caused a little bit of a blister which is subsequently opened up at this point is given her pain. Fortunately it does not appear to be any evidence of infection which is good news. No fevers chills noted. 01/13/18 on evaluation today patient appears to be doing rather well in regard to her bilateral lower extremities. The dressings did get kind of stuck as far as the wound beds are concerned but again I think this is mainly due to the fact that she actually seems to be showing signs of healing which is good news. She's not having as much drainage  therefore she was having more of the dressing sticking. Nonetheless overall I feel like her swelling is dramatically down compared to previous. 01/20/18 on evaluation today patient unfortunately though she's doing better in most regards has a large blister on the left anterior lower extremity where she is draining quite significantly. Subsequently this is going to need debridement today in order to see what's underneath and ensure she does not continue to trapping fluid at this location. Nonetheless No fevers, chills, nausea, or vomiting noted at this time. 01/27/18 on evaluation today patient appears to be doing rather well at this point in regard to her right lower extremity there's just a very small area that she still has open at this point. With that being said I do believe that she is tolerating the compression wraps very well in making good progress. [name] is coming out at this point to see her. Her left lower extremity on the lateral portion is actually what still mainly open and causing her some discomfort for the most part 02/10/18 on evaluation today patient actually appears to be doing very well in regard to her right lower extremity were all the ulcers appear to be completely close. In regard to the left lower extremity she does have two areas still open and some leaking from the dorsal surface of her foot but this still seems to be doing much better to me in general. 02/24/18 on evaluation today patient actually appears to be doing much better in regard to her right lower extremity this is still completely healed. Her left lower extremity is also doing much better fortunately she has no evidence of infection. The one area that is gonna require some debridement is still  on the left anterior shin. Fortunately this is not hurting her as badly today. 03/10/18 on evaluation today patient appears to be doing better in some regards although she has a little bit more open area on the dorsal  foot and she also has some issues on the medial portion of the left lower extremity which is actually new and somewhat deep. With that being said there fortunately does not appear to be any significant signs of infection which is good news. No fevers, chills, nausea, or vomiting noted at this time. In general her swelling seems to be doing fairly well which is good news. 03/31/18 on evaluation today patient presents for follow-up concerning her left lower extremity lymphedema. Unfortunately she has been doing a little bit more poorly since I last saw her in regard to the amount of weeping that she is experiencing. She's also having some increased pain in the anterior shin location. Unfortunately I do not feel like the patient is making such good progress at this point a few weeks back she was definitely doing much better. 04/07/18 on evaluation today patient actually appears to be showing some signs of improvement as far as the left lower extremity is concerned. She has been tolerating the dressing changes and it does appear that the Drawtex did better for her. With that being said unfortunately [name] is stating that they cannot obtain the Drawtex going forward. Nonetheless we're gonna have to check and see what they may be able to get the alginate they were using was getting stuck in causing new areas of skin being pulled all that with and subsequently weep and calls her to worsen overall this is the first time we've seen improvement at this time. 04/14/18 on evaluation today patient actually appears to be doing rather well at this point there does not appear to be any evidence of infection at this time and she is actually doing excellent in regard to the weeping in fact she almost has no openings remaining even compared to just last week this is a dramatic improvement. No fevers chills noted 04/21/18 evaluation today patient actually appears to be doing very well. She in fact is has a small  area on the posterior lower extremity location and she has a small area on the dorsal surface of her foot that are still open both of which are very close to closing. We're hoping this will be close shortly. She brought her Juxta-Lite wrap with her today hoping that would be able to put her in it unfortunately I don't think were quite at that point yet but we're getting closer. 04/28/18 upon evaluation today patient actually appears to be doing excellent in regard to her left lower extremity ulcer. In fact the region on the posterior lower extremity actually is much smaller than previously noted. Overall I'm very happy with the progress she has made. She again did bring her Juxta-Lite although we're not quite ready for that yet. 05/11/18 upon evaluation today patient actually appears to be doing in general fairly well in regard to her left lower Trinity. The swelling is very well controlled. With that being said she has a new area on the left anterior lower extremity as well as between the first and second toes of her left foot that was not present during the last evaluation. The region of her posterior left lower extremity actually appears to be almost completely healed. To be honest I'm very pleased with the way that stands. Nonetheless I do believe  that the lotion may be keeping the area to moist as far as her legs are concerned subsequently I'm gonna consider discontinuing that today. 05/26/18 on evaluation today patient appears to be doing rather well in regard to her left lower should be ulcers. In fact everything appears to be close except for a very small area on the left posterior lower extremity. Fortunately there does not appear to be any evidence of infection at this time. Overall very pleased with her progress. 06/02/18 and evaluation today patient actually appears to be doing very well in regard to her lower extremity ulcers. She has one small area that still continues to weep that I  think may benefit her being able to justify lotion and user Juxta-Lite wraps versus continued to wrap her. Nonetheless I think this is something we can definitely look into at this point. 06/23/18 on evaluation today patient unfortunately has openings of her bilateral lower extremities. In general she seems to be doing much worse than when I last saw her just as far as her overall [name] standpoint is concerned. She states that her discomfort is mainly due to neuropathy she's not having any other issues otherwise. No fevers, chills, nausea, or vomiting noted at this time. 06/30/18 on evaluation today patient actually appears to be doing a little worse in regard to her right lower extremity her left lower extremity of doing fairly well. Fortunately there is no sign of infection at this time. She has been tolerating the dressing changes without complication. Home [name] did not come out like they were supposed to for the appropriate wrap changes. They stated that they never received the orders from [place] which were fax. Nonetheless we will send a copy of the orders with the patient today as well. 07/07/18 on evaluation today patient appears to be doing much better in regard to lower extremities. She still has several openings bilaterally although since I last saw her her legs did show obvious signs of infection when she later saw her nurse. Subsequently a culture was obtained and she is been placed on Bactrim and Keflex. Fortunately things seem to be looking much better it does appear she likely had an infection. Again last week we'd even discussed it but again there really was not any obvious sign that she had infection therefore we held off on the antibiotics. Nonetheless I'm glad she's doing better today. 07/14/18 on evaluation today patient appears to be doing much better regarding her bilateral lower Trinity's. In fact on the right lower for me there's nothing open at this point there are some dry skin  areas at the sites where she had infection. Fortunately there is no evidence of systemic infection which is excellent news. No fevers chills noted 07/21/18 on evaluation today patient actually appears to be doing much better in regard to her left lower extremity ulcers. She is making good progress and overall I feel like she's improving each time I see her. She's having no pain I do feel like the infection is completely resolved which is excellent news. No fevers, chills, nausea, or vomiting noted at this time. 07/28/18 on evaluation today patient appears to be doing very well in regard to her left lower [employer]. Everything seems to be showing signs of improvement which is excellent news. Overall very pleased with the progress that has been made. Fortunately there's no evidence of active infection at this time also excellent news. 08/04/18 on evaluation today patient appears to be doing more poorly in regard  to her bilateral lower extremities. She has two new areas open up on the right and these were completely closed as of last week. She still has the two spots on the left which in my pinion seem to be doing better. Fortunately there's no evidence of infection again at this point. 08/11/18 on evaluation today patient actually appears to be doing very well in regard to her bilateral lower Trinity wounds that all seem to be doing better and are measures smaller today. Fortunately there's no signs of infection. No fevers, chills, nausea, or vomiting noted at this time. 08/18/18 on evaluation today patient actually appears to be doing about the same inverter bilateral lower extremities. She continues to have areas that blistering open as was drain that fortunately nothing too significant. Overall I feel like Drawtex may have done better for her however compared to the collagen. 08/25/18 on evaluation today patient appears to be doing a little bit more poorly today even compared to last time I saw  her. Again I'm not exactly sure why she's making worse progress over the past several weeks. I'm beginning to wonder if there is some kind of underlying low level infection causing this issue. I did actually take a culture from the left anterior lower extremity but it was a new wound draining quite a bit at this point. Unfortunately she also seems to be having more pain which is what also makes me worried about the possibility of infection. This is despite never erythema noted at this point. 09/01/18 on evaluation today patient actually appears to be doing a little worse even compared to last week in regard to bilateral lower extremities. She did go to the hospital on the 16th was given a dose of IV Zosyn and then discharged with a recommendation to continue with the Bactrim that I previously prescribed for her. Nonetheless she is still having a lot of discomfort she tells me as well at this time. This is definitely unfortunate. No fevers, chills, nausea, or vomiting noted at this time. 09/08/18 on evaluation today patient's bilateral lower extremities actually appear to be shown signs of improvement which is good news. Fortunately there does not appear to be any signs of active infection I think the anabiotic is helping in this regard. Overall I'm very pleased with how she is progressing. 09/15/18 patient was actually seen in ER yesterday due to her legs as well unfortunately. She states she's been having a lot of pain and discomfort as well as a lot of drainage. Upon inspection today the patient does have a lot of swelling and drainage I feel like this is more related to lymphedema and poor fluid control than it is to infection based on what I'm seeing. The physician in the emergency department also doubted that the patient was having a significant infection nonetheless I see no evidence of infection obvious at this point although I do see evidence of poor fluid control. She still not using a  compression pumps, she is not elevating due to her lift chair as well as her hospital bed being broken, and she really is not keeping her legs up as much as they should be and also has been taking off her wraps. All this combined I think has led to poor fluid control and to be honest she may be somewhat volume overloaded in general as well. I recommend that she may need to contact your physician to see if a prescription for a diuretic would be beneficial in their opinion.  As long as this is safe I think it would likely help her. 09/29/18 on evaluation today patient's left lower extremity actually appears to be doing quite a bit better. At least compared to last time that I saw her. She still has a large area where she is draining from but there's a lot of new skin speckled trout and in fact there's more new skin that there are open areas of weeping and drainage at this point. This is good news. With regard to the right lower extremity this is doing much better with the only open area that I really see being a dry spot on the right lateral ankle currently. Fortunately there's no signs of active infection at this time which is good news. No fevers, chills, nausea, or vomiting noted at this time. The patient seems somewhat stressed and overwhelmed during the visit today she was very lethargic as such. She does and she is not taking any pain medications at this point. Apparently according to her husband are also in the process of moving which is probably taking its toll on her as well. 10/06/18 on evaluation today patient appears to be doing rather well in regard to her lower extremities compared to last evaluation. Fortunately there's no signs of active infection. She tells me she did have an appointment with her primary. Nonetheless he was concerned that the wounds were somewhat deep based on pictures but we never actually saw her legs. She states that he had her somewhat worried due to the fact that  she was fearing now that she was [name] Marino have to have an amputation. With that being said based on what I'm seeing check she looks better this week that she has the last two times I've seen her with much less drainage I'm actually pleased in this regard. That doesn't mean that she's out of the water but again I do not think what the point of talking about education at all in regard to her leg. She is very happy to hear this. She is also not having as much pain as she was having last week. 10/13/18 unfortunately on evaluation today patient still continues to have a significant amount of drainage she's not letting home [name] actually apply the compression dressings at this point. She's trying to use of Juxta-Lite of the top of Kerlex and the second layer of the three layer compression wrap. With that being said she just does not seem to be making as good a progress as I would expect if she was having the compression applied and in place on a regular basis. No fevers, chills, nausea, or vomiting noted at this time. 10/20/18 on evaluation today patient appears to be doing a little better in regard to her bilateral lower extremity ulcers. In fact the right lower extremity seems to be healed she doesn't even have any openings at this point left lower extremity though still somewhat macerated seems to be showing signs of new skin growth at multiple locations throughout. Fortunately there's no evidence of active infection at this time. No fevers, chills, nausea, or vomiting noted at this time. 10/27/18 on evaluation today patient appears to be doing much better in regard to her left lower Trinity ulcer. She's been tolerating the laptop complication and has minimal drainage noted at this point. Fortunately there's no signs of active infection at this time. No fevers, chills, nausea, or vomiting noted at this time. 11/03/18 on evaluation today patient actually appears to be doing excellent in regard to  her left  lower extremity. She is having very little drainage at this point there does not appear to be any significant signs of infection overall very pleased with how things have gone. She is likewise extremely pleased still and seems to be making wonderful progress week to week. I do believe antibiotics were helpful for her. Her primary care provider did place on amateur clean since I last saw her. 11/17/18 on evaluation today patient appears to be doing worse in regard to her bilateral lower extremities at this point. She is been tolerating the dressing changes without complication. With that being said she typically takes the Coban off fairly quickly upon arriving home even after being seen here in the clinic and does not allow home [name] reapply command as part of the dressing at home. Therefore she said no compression essentially since I last saw her as best I can tell. With that being said I think it shows and how much swelling she has in the open wounds that are noted at this point. Fortunately there's no signs of infection but unfortunately if she doesn't get this under control I think she will end up with infection and more significant issues. 11/24/18 on evaluation today patient actually appears to be doing somewhat better in regard to her bilateral lower extremities. 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 Nonetheless that is doing better currently so she should be able to attempt reinitiating the lymphedema pumps at this time. No fevers, chills, nausea, or vomiting noted at this time. 12/01/18 upon evaluation today patient's left lower extremity appears to be doing quite well unfortunately her right lower extremity is not doing nearly as well. She has been tolerating the dressing changes without complication unfortunately she did not keep a wrap on the  right at this time. Nonetheless I believe this has led to increased swelling and weeping in the world is actually much larger than during the last evaluation with her. 12/08/18 on evaluation today patient appears to be doing about the same at this point in regard to her right lower extremity. There is some more palatable to touch I'm concerned about the possibility of there being some infection although I think the main issue is she's not keeping her compression wrap on which in turn is not allowing this area to heal appropriately. 12/22/18 on evaluation today patient appears to be doing better in regard to left lower extremity unfortunately significantly worse in regard to the right lower extremity. The areas of blistering and necrotic superficial tissue have spread and again this does not really appear to be signs of infection and all she just doesn't seem to be doing nearly as well is what she has been in the past. Overall I feel like the Augmentin did absolutely nothing for her she doesn't seem to have any infection again I really didn't think so last time either is more of a potential preventative measure and hoping that this would make some difference but I think the main issue is she's not wearing her compression. She tells me she cannot wear the [place] been we put on she takes it off pretty much upon getting home. Subsequently she worshiped Juxta-Lite when I questioned her about how often she wears it this is no more than three hours a day obviously that leaves 21  hours that she has no compression and this is obviously not doing well for her. Overall I'm concerned that if things continue to worsen she is at great risk of both infection as well as losing her leg. 01/05/19 on evaluation today patient appears to be doing well in regard to her left lower extremity which he is allowing [place] to wrap and not so well with regard to her right lower extremity which she is not allowing [place] to really wrap  and keep the wrap on. She states that it hurts too badly whenever it's wrapped and she ends up having to take it off. She's been using the Juxta-Lite she tells me up to six hours a day although I question whether or not that's really been the case to be honest. Previously she told me three hours today nonetheless obviously the legs as long as the wrap is doing great when she is not is doing much more poorly. 01/12/2019 on evaluation today patient actually appears to be doing a little better in my opinion with regard to her right lower extremity ulcer. She has a small open area on the left lower extremity unfortunately but again this I think is part of the normal fluctuation of what she is going to have to expect with regard to her legs especially when she is not using her lymphedema pumps on a regular basis. Subsequently based on what I am seeing today I think that she does seem to be doing slightly better with regard to her right lower extremity she did see her primary care provider on Monday they felt she had an infection and placed her on 2 antibiotics. Both Cipro and clindamycin. Subsequently again she seems possibly to be doing a little bit better in regards to the right lower extremity she also tells me however she has been wearing the compression wrap over the past week since I spoke with her as well that is a Kerlix and Coban wrap on the right. No fevers, chills, nausea, vomiting, or diarrhea. 01/19/2019 on evaluation today patient appears to be doing better with regard to her bilateral lower extremities especially the right. I feel like the compression has been beneficial for her which is great news. She did get a call from her primary care provider on her way here today telling her that she did have methicillin-resistant Staphylococcus aureus and he was calling in a couple new antibiotics for her including a ointment to be applied she tells me 3 times a day. With that being said this sounds  like likely to be Bactroban which I think could be applied with each dressing/wrap change but I would not be able to accommodate her applying this 3 times a day. She is in agreement with the least doing this we will add that to her orders today. 01/26/2019 on evaluation today patient actually appears to be doing much better with regard to her right lower extremity. Her left lower extremity is also doing quite well all things considering. Fortunately there is no evidence of active infection at this time. No fevers, chills, nausea, vomiting, or diarrhea. 02/02/2019 on evaluation today patient appears to be doing much better compared to her last evaluation. Little by little off like her right leg is returning more towards normal. There does not appear to be any signs of active infection and overall she seems to be doing quite well which is great news. I am very pleased in this regard. No fevers, chills, nausea, vomiting, or diarrhea. 02/09/2019  upon evaluation today patient appears to be doing better with regard to her bilateral lower extremities. She has been tolerating the dressing changes without complication. Fortunately there is no signs of active infection at this time. No fevers, chills, nausea, vomiting, or diarrhea. 02/23/2019 on evaluation today patient actually appears to be doing quite well with regard to her bilateral lower extremities. She has been tolerating the dressing changes without complication. She is even used her pumps one time and states that she really felt like it felt good. With that being said she seems to be in good spirits and her legs appear to be doing excellent. 03/09/2019 on evaluation today patient appears to be doing well with regard to her right lower extremity there are no open wounds at this time she is having some discomfort but I feel like this is more neuropathy than anything. With regard to her left lower extremity she had several areas scattered around that she  does have some weeping and drainage from but again overall she does not appear to be having any significant issues and no evidence of infection at this time which is good news. 03/23/2019 on evaluation today patient appears to be doing well with regard to her right lower extremity which she tells me is still close she is using her juxta light here. Her left lower extremity she mainly just has an area on the foot which is still slightly draining although this also is doing great. Overall very pleased at this time. 04/06/2019 patient appears to be doing a little bit worse in regard to her left lower extremity upon evaluation today. She feels like this could be becoming infected again which she had issues with previous. Fortunately there is no signs of systemic infection but again this is always a struggle with her with her legs she will go from doing well to not so well in a very short amount of time. 04/20/2019 on evaluation today patient actually appears to be doing quite well with regard to her right lower extremity I do not see any signs of active infection at this time. Fortunately there is no fever chills noted. She is still taking the antibiotics which I prescribed for her at this point. In regard to the left lower extremity I do feel like some of these areas are better although again she still is having weeping from several locations at this time. 04/27/2019 on evaluation today patient appears to be doing about the same if not slightly worse in regard to her left lower extremity ulcers. She tells me when questioned that she has been sleeping in her Hoveround chair in fact she tells me she falls asleep without even knowing it. I think she is spending a whole lot of time in the chair and less time walking and moving around which is not good for her legs either. On top of that she is in a seated position which is also the worst position she is not really elevating her legs and she is also not  using her lymphedema pumps. All this is good to contribute to worsening of her condition in general. 05/18/2019 on evaluation today patient appears to be doing well with regard to her lower extremity on the right in fact this is showing no signs of any open wounds at this time. On the left she is continuing to have issues with areas that do drain. Some of the regions have healed and there are couple areas that have reopened. She did go to  the ER per the patient according to recommendations from the home [name] nurse due to what she was seen when she came out on 05/13/2019. Subsequently she felt like the patient needed to go to the hospital due to the fact that again she was having "milky white discharge" from her leg. Nonetheless she had and then was placed on doxycycline and subsequently seems to be doing better. Electronic Signature(s) Signed: 05/19/2019 10:36:32 AM By: Lenda Kelp PA-C Entered By: Lenda Kelp on 05/18/2019 13:52:22 -------------------------------------------------------------------------------- Physical Exam Details Patient Name: Date of Service: [name], [name] 05/18/2019 1:00 PM Medical Record [id number] Patient Account Number: [id number] Date of Birth/Sex: Treating RN: [birth year] (68 y.o. [name] Primary Care Provider: Fatima Sanger Other Clinician: Referring Provider: Treating Provider/Extender:[name] Weeks in Treatment: 45 Constitutional Well-nourished and well-hydrated in no acute distress. Respiratory normal breathing without difficulty. clear to auscultation bilaterally. Cardiovascular regular rate and rhythm with normal S1, S2. Psychiatric this patient is able to make decisions and demonstrates good insight into disease process. Alert and Oriented x 3. pleasant and cooperative. Notes Upon inspection today patient's wounds actually are showing signs of good epithelization in a lot of areas and she has really  minimal drainage. She still has a lot of very dry and crusty skin over the majority of her lower extremity on the left much greater than on the right. There was no sharp debridement performed today. Electronic Signature(s) Signed: 05/19/2019 10:36:32 AM By: Lenda Kelp PA-C Entered By: Lenda Kelp on 05/18/2019 13:52:53 -------------------------------------------------------------------------------- Physician Orders Details Patient Name: Date of Service: [name]. 05/18/2019 1:00 PM Medical Record [id number] Patient Account Number: [id number] Date of Birth/Sex: Treating RN: Oct 25, 1950 (68 y.o. [name] Primary Care Provider: Fatima Sanger Other Clinician: Referring Provider: Treating Provider/Extender:[name] Weeks in Treatment: 38 Verbal / Phone Orders: No Diagnosis Coding ICD-10 Coding Code Description E11.622 Type 2 diabetes mellitus with other skin ulcer I89.0 Lymphedema, not elsewhere classified I87.331 Chronic venous hypertension (idiopathic) with ulcer and inflammation of right lower extremity I87.332 Chronic venous hypertension (idiopathic) with ulcer and inflammation of left lower extremity L97.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 leg with fat layer exposed L97.822 Non-pressure chronic ulcer of other part of left lower leg with fat layer exposed I10 Essential (primary) hypertension E66.01 Morbid (severe) obesity due to excess calories F41.8 Other specified anxiety disorders R53.1 Weakness Follow-up Appointments Return Appointment in 2 weeks. Dressing Change Frequency Other: - 2 times per week Wound Cleansing Clean wound with Wound Cleanser - all wounds May shower with protection. Primary Wound Dressing Wound #58 Left,Medial Ankle Calcium Alginate with Silver Wound #61 Left,Anterior Lower Leg Calcium Alginate with Silver Wound #62 Left,Proximal,Medial Lower Leg Calcium Alginate with  Silver Secondary Dressing Dry Gauze - all wounds ABD pad - as needed Edema Control 3 Layer Compression System - Left Lower Extremity Avoid standing for long periods of time - walking is encouraged Elevate legs to the level of the heart or above for 30 minutes daily and/or when sitting, a frequency of: - do not sleep in chair with feet dangling Exercise regularly Support Garment 20-30 mm/Hg pressure to: - Juxtalite to right lower leg daily Segmental Compressive Device. - lymphedema pumps 60 minutes 1- 2 times per day Additional Orders / Instructions Follow Nutritious Diet - To include vitamin A, vitamin C, and Zinc along with increased protein intake. [name] Continue [name] skilled nursing for wound care. -  [facility] Electronic Signature(s) Signed: 05/18/2019 6:22:02 PM By: Zenaida Deed RN, BSN Signed: 05/19/2019 10:36:32 AM By: Lenda Kelp PA-C Entered By: Zenaida Deed on 05/18/2019 13:49:10 -------------------------------------------------------------------------------- Problem List Details Patient Name: Date of Service: [name]. 05/18/2019 1:00 PM Medical Record [id number] Patient Account Number: [id number] Date of Birth/Sex: Treating RN: 1950-11-09 (67 y.o. Billy [place], Linda Primary Care Provider: Fatima Sanger Other Clinician: Referring Provider: Treating Provider/Extender:[name] Weeks in Treatment: 61 Active Problems ICD-10 Evaluated Encounter Code Description Active Date Today Diagnosis E11.622 Type 2 diabetes mellitus with other skin ulcer 08/05/2017 No Yes I89.0 Lymphedema, not elsewhere classified 08/05/2017 No Yes I87.331 Chronic venous hypertension (idiopathic) with ulcer 08/05/2017 No Yes and inflammation of right lower extremity I87.332 Chronic venous hypertension (idiopathic) with ulcer 08/05/2017 No Yes and inflammation of left lower extremity L97.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  08/05/2017 No Yes leg with fat layer exposed L97.822 Non-pressure chronic ulcer of other part of left lower 08/05/2017 No Yes leg with fat layer exposed I10 Essential (primary) hypertension 08/05/2017 No Yes E66.01 Morbid (severe) obesity due to excess calories 08/05/2017 No Yes F41.8 Other specified anxiety disorders 08/05/2017 No Yes R53.1 Weakness 08/05/2017 No Yes Inactive Problems Resolved Problems Electronic Signature(s) Signed: 05/19/2019 10:36:32 AM By: Lenda Kelp PA-C Entered By: Lenda Kelp on 05/18/2019 13:39:28 -------------------------------------------------------------------------------- Progress Note Details Patient Name: Date of Service: [name]. 05/18/2019 1:00 PM Medical Record [id number] Patient Account Number: [id number] Date of Birth/Sex: Treating RN: 1951/03/15 (68 y.o. [name] Primary Care Provider: Fatima Sanger Other Clinician: Referring Provider: Treating Provider/Extender:[name] Weeks in Treatment: 56 Subjective Chief Complaint Information obtained from Patient Bilateral reoccurring LE ulcers History of Present Illness (HPI) this patient has been seen a couple of times before and returns with recurrent problems to her right and left lower extremity with swelling and weeping ulcerations due to not wearing her compression stockings which she had been advised to do during her last discharge, at the end of June 2018. During her last visit the patient had had normal arterial blood flow and her venous reflux study did not necessitate any surgical intervention. She was recommended compression and elevation and wound care. After prolonged treatment the patient was completely healed but she has been noncompliant with wearing or compressions.. She was here last week with an outpatient return visit planned but the patient came in a very poor general condition with altered mental status and was rushed to the ER on my  request. With a history of hypertension, diabetes, TIA and right-sided weakness she was set up for an MRI on her brain and cervical spine and was sent to [facility]. Getting an MRI done was very difficult but once the workup was done she was found not to have any spinal stenosis, epidural abscess or hematoma or discitis. This was radiculopathy to be treated as an outpatient and she was given a follow-up appointment. Today she is feeling much better alert and oriented and has come to reevaluate her bilateral lower extremity lymphedema and ulceration 03/25/2017 -- she was admitted to the hospital on 03/16/2017 and discharged on 03/18/2017 with left leg cellulitis and ulceration. She was started on vancomycin and Zosyn and x-ray showed no bony involvement. She was treated for a cellulitis with IV antibiotics changed to Rocephin and Flagyl and was discharged on oral Keflex and doxycycline to complete a 7 day course. Last hemoglobin A1c was 7.1 and her other  ailments including hypertension got asthma were appropriately treated. 05/06/2017 -- she is awaiting the right size of compression stockings from [place] but other than that has been doing well. ====== Old notes 68 year old patient was seen one time last October and was lost to follow-up. She has recurrent problems with weeping and ulceration of her left lower extremity and has swelling of this for several years. It has been worse for the last 2 months. Past medical history is significant for diabetes mellitus type 2, hypertension, gout, morbid obesity, depressive disorders, hiatal hernia, migraines, status post knee surgery, risk of a cholecystectomy, vaginal hysterectomy and breast biopsy. She is not a smoker. As noted before she has never had a venous duplex study and an arterial ABI study was attempted but the left lower extremity was noncompressible 10/01/2016 -- had a lower extremity venous duplex reflux evaluation which showed no  evidence of deep vein reflux in the right or left lower extremity, and no evidence of great saphenous vein reflux more than 500 ms in the right or left lower extremity, and the left small saphenous vein is incompetent but no vascular consult was recommended. review of her electronic medical records noted that the ABI was checked in July 2017 where the right ABI was normal limits and the left ABI could not be ascertained due to pain with cuff pressure but the waveforms are within normal limits. her arterial duplex study scheduled for April 27. 10/08/2016 -- the patient has various reasons for not having a compression on and for the last 3 days she has had no compression on her left lower extremity either due to pain or the lack of nursing help. She does not use her juxta lites either. 10/15/2016 -- the patient did not keep her appointment for arterial duplex study on April 27 and I have asked her to reschedule this. Her pain is out of proportion with the physical findings and she continuously fails to wear a compression wraps and cuts them off because she says she cannot tolerate the pain. She does not use her juxta lites either. 10/22/2016 -- he has rescheduled her arterial duplex study to May 21 and her pain today is a bit better. She has not been wearing her juxta lites on her right lower extremity but now understands that she needs to do this. She did tolerate the to press compression wrap on her left lower extremity 10/29/2016 --arterial duplex study is scheduled for next week and overall she has been tolerating her compression wraps and also using her juxta lites on her right lower extremity 11/05/2016 -- the right ABI was 0.95 the left was 1.03. The digit TBI is on the right was 0.83 on the left was 0.92 and she had biphasic flow through these vessels. The impression was that of normal lower extremity arterial study. 11/12/2016 -- her pain is minimal and she is doing very well  overall. 11/26/2016 -- she has got juxta lites and her insurance will not pay for additional dual layer compression stockings. She is going to order some from [employer]. 05/12/2017 -- her juxta lites are very old and too big for her and these have not been helping with compression. She did get 20-30 mm compression stockings from [place] but she and her husband are unable to put these on. I believe she will benefit from bilateral Extremit-ease, compression stockings and we will measure her for these today. 05/20/2017 -- lymphedema on the left lower extremity has increased a lot and she has a  open ulceration as a result of this. The right lower extremity is looking pretty good. She has decided to by the compression stockings herself and will get reimbursed by the [name], at a later date. 05/27/2017 -- her sciatica is bothering her a lot and she thought her left leg pain was caused due to the compression wrap and hence removed it and has significant lymphedema. There is no inflammation on this left lower extremity. 06/17/17 on evaluation today patient appears to be doing very well and in fact is completely healed in regard to her ulcerations. Unfortunately however she does have continued issues with lymphedema nonetheless. We did order compression garments for her unfortunately she states that the size that she received were large although we ordered medium. Obviously this means she is not getting the optimal compression. She does not have those with her today and therefore we could not confirm and contact the company on her behalf. Nonetheless she does state that she is going to have her husband bring them by tomorrow so that we can verify and then get in touch with the company. No fevers, chills, nausea, or vomiting noted at this time. Overall patient is doing better otherwise and I'm pleased with the progress she has made. 07/01/17 on evaluation today patient appears to be doing very well in  regard to her bilateral lower extremity she does not have any openings at this point which is excellent news. Overall I'm pleased with how things have progressed up to this time. Since she is doing so well we did order her compression which we are seeing her today to ensure that it fits her properly and everything is doing well in that regard and then subsequently she will be discharged. ============ Old Notes: 03/31/16 patient presents today for evaluation concerning open wounds that she has over the left medial ankle region as well as the left dorsal foot. She has previously had this occur although it has been healed for a number of months after having this for about a year prior until her hospitalization on 01/05/16. At that point in time it appears that she was admitted to the hospital for left lower extremity cellulitis and was placed on vancomycin and Zosyn at that point. Eventually upon discharge on January 15, 2016 she was placed on doxycycline at that point in time. Later on 03/27/16 positive wound culture growing Escherichia coli this was switched to amoxicillin. Currently she tells me that she is having pain radiated to be a 7 out of 10 which can be as high as 10 out of 10 with palpation and manipulation of the wound. This wound appears to be mainly venous in nature due to the bilateral lower extremity venous stasis/lymphedema. This is definitely much worse on her left than the right side. She does have type 1 diabetes mellitus, hypertension, morbid obesity, and is wheelchair dependent.during the course of the hospital stay a blood culture was also obtained and fortunately appeared negative. She also had an x-ray of the tibia/fibula on the left which showed no acute bone abnormality. Her white blood cell count which was performed last on 03/25/16 was 7.3, hemoglobin 12.8, protein 7.1, albumin 3.0. Her urine culture appeared to be negative for any specific organisms. Patient did have a left  lower extremity venous duplex evaluation for DVT. This did not include venous reflux studies but fortunately was negative for DVT. Patient also had arterial studies performed which revealed that she had a normal ABI on the right though this  was unable to be performed on the left secondary to pain that she was having around the ankle region due to the wound. However it was stated on report that she had biphasic pulses and apparently good blood flow. ========== 06/03/17 she is here in follow-up evaluation for right lower extremity ulcer. The right lower sure he has healed but she has reopened to the left medial malleolus and dorsal foot with weeping. She is waiting for new compression garments to arrive from [name], the previous compression garments were ill fitting. We will continue with compression bilaterally and follow-up in 2 weeks Readmission: 08/05/17 on evaluation today patient appears to be doing somewhat poorly in regard to her left lower extremity especially although the right lower extremity has a small area which may no longer be open. She has been having a lot of drainage from the left lower extremity however he tells me that she has not been able to use the EXTREMIT- EASE Compression at this point. She states that she did better and was able to actually apply the Juxta-Lite compression although the wound that she has is too large and therefore really does not compress which is why she cannot wear it at this point. She has no one who can help her put it on regular basis her son can sometimes but he's not able to do it most of the time. I do believe that's why she has begun to weave and have issues as she is currently yet again. No fevers, chills, nausea, or vomiting noted at this time. Patient is no evidence of dementia. 08/12/17 on evaluation today patient appears to still be doing fairly well in regard to the draining areas/weeping areas at this point. With that being said she  unfortunately did go to the ER yesterday due to what was felt to be possibly a cellulitis. They place her on doxycycline by mouth and discharge her home. She definitely was not admitted. With that being said she states she has had more discomfort which has been unusual for her even compared to prior times and she's had infections.08/12/17 on evaluation today patient appears to still be doing fairly well in regard to the draining areas/weeping areas at this point. With that being said she unfortunately did go to the ER yesterday due to what was felt to be possibly a cellulitis. They place her on doxycycline by mouth and discharge her home. She definitely was not admitted. With that being said she states she has had more discomfort which has been unusual for her even compared to prior times and she's had infections. 08/19/17 put evaluation today patient tells me that she's been having a lot of what sounds to be neuropathic type pain in regard to her left lower extremity. She has been using over-the-counter topical bins again which some believe. That in order to apply the she actually remove the wrap we put on her last Wednesday on Thursday. Subsequently she has not had anything on compression wise since that time. The good news is a lot of the weeping areas appear to have closed at this point again I believe she would do better with compression but we are struggling to get her to actually use what she needs to at this point. No fevers, chills, nausea, or vomiting noted at this time. 09/03/17 on evaluation today patient appears to be doing okay in regard to her lower extremities in regard to the lymphedema and weeping. Fortunately she does not seem to show any signs  of infection at this point she does have a little bit of weeping occurring in the right medial malleolus area. With that being said this does not appear to be too significant which is good news. 09/10/17; this is a patient with severe bilateral  secondary lymphedema secondary to chronic venous insufficiency. She has severe skin damage secondary to both of these features involving the dorsal left foot and medial left ankle and lower leg. Still has open areas in the left anterior foot. The area on the right closed over. She uses her own juxta light stockings. She does not have an arterial issue 09/16/17 on evaluation today patient actually appears to be doing excellent in regard to her bilateral lower extremity swelling. The Juxta-Lite compression wrap seem to be doing very well for her. She has not however been using the portion that goes over her foot. Her left foot still is draining a little bit not nearly as significant as it has been in the past but still I do believe that she likely needs to utilize the full wrap including the foot portion of this will improve as well. She also has been apparently putting on a significant amount of Vaseline which also think is not helpful for her. I recommended that if she feels she needs something for moisturizer Eucerin will probably be better. 09/30/17 on evaluation today patient presents with several new open areas in regard to her left lower extremity although these appear to be minimal and mainly seem to be more moisture breakdown than anything. Fortunately she does not seem to have any evidence of infection which is great news. She has been tolerating the dressing changes without complication we are using silver alginate on the foot she has been using AB pads to have the legs and using her Juxta-Lite compression which seems to be controlling her swelling very well. Overall I'm pleased with the poor way she has progressed. 10/14/17 on evaluation today patient appears to be doing better in regard to her left lower extremity areas of weeping. She does still have some discomfort although in general this does not appear to be as macerated and I think it is progressing nicely. I do think she still needs to  wear the foot portion of her Juxta-Lite in order to get the most benefit from the wrap obviously. She states she understands. Fortunately there does not appear to be evidence of infection at this time which is great news. 10/28/17 on evaluation today patient appears to be doing excellent in regard to her left lower extremity. She has just a couple areas that are still open and seem to be causing any trouble whatsoever. For that reason I think that she is definitely headed in the right direction the spots are very tiny compared to what we have been dealing with in the past. 11/11/17 on evaluation today patient appears to have a right lateral lower extremity ulcer that has opened since I last saw her. She states this is where the [name] nurse that was coming out remove the dressing without wetting the alginate first. Nonetheless I do not know if this is indeed the case or not but more importantly we have not ordered home help to be coming out for her wounds at all. I'm unsure as to why they are coming out and we're gonna have to check on this and get things situated in that regard. With that being said we currently really do not need them to be coming out as the  patient has been taking care of her leg herself without complication and no issues. In fact she was doing much better prior to nursing coming out. 11/25/17 on evaluation today patient actually appears to be doing fairly well in regard to her left lower extremity swelling. In fact she has very little area of weeping at this point there's just a small spot on the lateral portion of her right leg that still has me just a little bit more concerned as far as wanting to see this clear up before I discharge her to caring for this at home. Nonetheless overall she has made excellent progress. 12/09/17 on evaluation today patient appears to be doing rather well in regard to her lower extremity edema. She does have some weeping still in the left lower  extremity although the big area we were taking care of two weeks ago actually has closed and she has another area of weeping on the left lower extremity immediately as well is the top of her foot. She does not currently have lymphedema pumps she has been wearing her compression daily on a regular basis as directed. With that being said I think she may benefit from lymphedema pumps. 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 We had a very difficult time getting and keeping this under control. 12/23/17 on evaluation today patient actually appears to be doing a little bit more poorly in regard to her bilateral lower extremities. She has been tolerating the Juxta-Lite compression wraps. Unfortunately she has two new ulcers on the right lower extremity and left lower Trinity ulceration seems to be larger. Obviously this is not good news. She has been tolerating the dressings without complication. 12/30/17 on evaluation today patient actually appears to be doing much better in regard to her bilateral lower extremity edema. She continues to have some issues with ulcerations and in fact there appears to be one spot on each leg where the wrap may have caused a little bit of a blister which is subsequently opened up at this point is given her pain. Fortunately it does not appear to be any evidence of infection which is good news. No fevers chills noted. 01/13/18 on evaluation today patient appears to be doing rather well in regard to her bilateral lower extremities. The dressings did get kind of stuck as far as the wound beds are concerned but again I think this is mainly due to the fact that she actually seems to be showing signs of healing which is good news. She's not having as much drainage therefore she was having more of the dressing sticking.  Nonetheless overall I feel like her swelling is dramatically down compared to previous. 01/20/18 on evaluation today patient unfortunately though she's doing better in most regards has a large blister on the left anterior lower extremity where she is draining quite significantly. Subsequently this is going to need debridement today in order to see what's underneath and ensure she does not continue to trapping fluid at this location. Nonetheless No fevers, chills, nausea, or vomiting noted at this time. 01/27/18 on evaluation today patient appears to be doing rather well at this point in regard to her right lower extremity there's just a very small area that she still has open at this point. With that being said I do believe that she is tolerating the compression wraps  very well in making good progress. [name] is coming out at this point to see her. Her left lower extremity on the lateral portion is actually what still mainly open and causing her some discomfort for the most part 02/10/18 on evaluation today patient actually appears to be doing very well in regard to her right lower extremity were all the ulcers appear to be completely close. In regard to the left lower extremity she does have two areas still open and some leaking from the dorsal surface of her foot but this still seems to be doing much better to me in general. 02/24/18 on evaluation today patient actually appears to be doing much better in regard to her right lower extremity this is still completely healed. Her left lower extremity is also doing much better fortunately she has no evidence of infection. The one area that is gonna require some debridement is still on the left anterior shin. Fortunately this is not hurting her as badly today. 03/10/18 on evaluation today patient appears to be doing better in some regards although she has a little bit more open area on the dorsal foot and she also has some issues on the medial portion  of the left lower extremity which is actually new and somewhat deep. With that being said there fortunately does not appear to be any significant signs of infection which is good news. No fevers, chills, nausea, or vomiting noted at this time. In general her swelling seems to be doing fairly well which is good news. 03/31/18 on evaluation today patient presents for follow-up concerning her left lower extremity lymphedema. Unfortunately she has been doing a little bit more poorly since I last saw her in regard to the amount of weeping that she is experiencing. She's also having some increased pain in the anterior shin location. Unfortunately I do not feel like the patient is making such good progress at this point a few weeks back she was definitely doing much better. 04/07/18 on evaluation today patient actually appears to be showing some signs of improvement as far as the left lower extremity is concerned. She has been tolerating the dressing changes and it does appear that the Drawtex did better for her. With that being said unfortunately [name] is stating that they cannot obtain the Drawtex going forward. Nonetheless we're gonna have to check and see what they may be able to get the alginate they were using was getting stuck in causing new areas of skin being pulled all that with and subsequently weep and calls her to worsen overall this is the first time we've seen improvement at this time. 04/14/18 on evaluation today patient actually appears to be doing rather well at this point there does not appear to be any evidence of infection at this time and she is actually doing excellent in regard to the weeping in fact she almost has no openings remaining even compared to just last week this is a dramatic improvement. No fevers chills noted 04/21/18 evaluation today patient actually appears to be doing very well. She in fact is has a small area on the posterior lower extremity location and she  has a small area on the dorsal surface of her foot that are still open both of which are very close to closing. We're hoping this will be close shortly. She brought her Juxta-Lite wrap with her today hoping that would be able to put her in it unfortunately I don't think were quite at that point yet  but we're getting closer. 04/28/18 upon evaluation today patient actually appears to be doing excellent in regard to her left lower extremity ulcer. In fact the region on the posterior lower extremity actually is much smaller than previously noted. Overall I'm very happy with the progress she has made. She again did bring her Juxta-Lite although we're not quite ready for that yet. 05/11/18 upon evaluation today patient actually appears to be doing in general fairly well in regard to her left lower Trinity. The swelling is very well controlled. With that being said she has a new area on the left anterior lower extremity as well as between the first and second toes of her left foot that was not present during the last evaluation. The region of her posterior left lower extremity actually appears to be almost completely healed. To be honest I'm very pleased with the way that stands. Nonetheless I do believe that the lotion may be keeping the area to moist as far as her legs are concerned subsequently I'm gonna consider discontinuing that today. 05/26/18 on evaluation today patient appears to be doing rather well in regard to her left lower should be ulcers. In fact everything appears to be close except for a very small area on the left posterior lower extremity. Fortunately there does not appear to be any evidence of infection at this time. Overall very pleased with her progress. 06/02/18 and evaluation today patient actually appears to be doing very well in regard to her lower extremity ulcers. She has one small area that still continues to weep that I think may benefit her being able to justify lotion and  user Juxta-Lite wraps versus continued to wrap her. Nonetheless I think this is something we can definitely look into at this point. 06/23/18 on evaluation today patient unfortunately has openings of her bilateral lower extremities. In general she seems to be doing much worse than when I last saw her just as far as her overall [name] standpoint is concerned. She states that her discomfort is mainly due to neuropathy she's not having any other issues otherwise. No fevers, chills, nausea, or vomiting noted at this time. 06/30/18 on evaluation today patient actually appears to be doing a little worse in regard to her right lower extremity her left lower extremity of doing fairly well. Fortunately there is no sign of infection at this time. She has been tolerating the dressing changes without complication. [name] did not come out like they were supposed to for the appropriate wrap changes. They stated that they never received the orders from us which were fax. Nonetheless we will send a copy of the orders with the patient today as well. 07/07/18 on evaluation today patient appears to be doing much better in regard to lower extremities. She still has several openings bilaterally although since I last saw her her legs did show obvious signs of infection when she later saw her nurse. Subsequently a culture was obtained and she is been placed on Bactrim and Keflex. Fortunately things seem to be looking much better it does appear she likely had an infection. Again last week we'd even discussed it but again there really was not any obvious sign that she had infection therefore we held off on the antibiotics. Nonetheless I'm glad she's doing better today. 07/14/18 on evaluation today patient appears to be doing much better regarding her bilateral lower Trinity's. In fact on the right lower for me there's nothing open at this point there are some dry skin  areas at the sites where she had infection.  Fortunately there is no evidence of systemic infection which is excellent news. No fevers chills noted 07/21/18 on evaluation today patient actually appears to be doing much better in regard to her left lower extremity ulcers. She is making good progress and overall I feel like she's improving each time I see her. She's having no pain I do feel like the infection is completely resolved which is excellent news. No fevers, chills, nausea, or vomiting noted at this time. 07/28/18 on evaluation today patient appears to be doing very well in regard to her left lower [employer]. Everything seems to be showing signs of improvement which is excellent news. Overall very pleased with the progress that has been made. Fortunately there's no evidence of active infection at this time also excellent news. 08/04/18 on evaluation today patient appears to be doing more poorly in regard to her bilateral lower extremities. She has two new areas open up on the right and these were completely closed as of last week. She still has the two spots on the left which in my pinion seem to be doing better. Fortunately there's no evidence of infection again at this point. 08/11/18 on evaluation today patient actually appears to be doing very well in regard to her bilateral lower Trinity wounds that all seem to be doing better and are measures smaller today. Fortunately there's no signs of infection. No fevers, chills, nausea, or vomiting noted at this time. 08/18/18 on evaluation today patient actually appears to be doing about the same inverter bilateral lower extremities. She continues to have areas that blistering open as was drain that fortunately nothing too significant. Overall I feel like Drawtex may have done better for her however compared to the collagen. 08/25/18 on evaluation today patient appears to be doing a little bit more poorly today even compared to last time I saw her. Again I'm not exactly sure why she's  making worse progress over the past several weeks. I'm beginning to wonder if there is some kind of underlying low level infection causing this issue. I did actually take a culture from the left anterior lower extremity but it was a new wound draining quite a bit at this point. Unfortunately she also seems to be having more pain which is what also makes me worried about the possibility of infection. This is despite never erythema noted at this point. 09/01/18 on evaluation today patient actually appears to be doing a little worse even compared to last week in regard to bilateral lower extremities. She did go to the hospital on the 16th was given a dose of IV Zosyn and then discharged with a recommendation to continue with the Bactrim that I previously prescribed for her. Nonetheless she is still having a lot of discomfort she tells me as well at this time. This is definitely unfortunate. No fevers, chills, nausea, or vomiting noted at this time. 09/08/18 on evaluation today patient's bilateral lower extremities actually appear to be shown signs of improvement which is good news. Fortunately there does not appear to be any signs of active infection I think the anabiotic is helping in this regard. Overall I'm very pleased with how she is progressing. 09/15/18 patient was actually seen in ER yesterday due to her legs as well unfortunately. She states she's been having a lot of pain and discomfort as well as a lot of drainage. Upon inspection today the patient does have a lot of swelling and drainage  I feel like this is more related to lymphedema and poor fluid control than it is to infection based on what I'm seeing. The physician in the emergency department also doubted that the patient was having a significant infection nonetheless I see no evidence of infection obvious at this point although I do see evidence of poor fluid control. She still not using a compression pumps, she is not elevating due to  her lift chair as well as her hospital bed being broken, and she really is not keeping her legs up as much as they should be and also has been taking off her wraps. All this combined I think has led to poor fluid control and to be honest she may be somewhat volume overloaded in general as well. I recommend that she may need to contact your physician to see if a prescription for a diuretic would be beneficial in their opinion. As long as this is safe I think it would likely help her. 09/29/18 on evaluation today patient's left lower extremity actually appears to be doing quite a bit better. At least compared to last time that I saw her. She still has a large area where she is draining from but there's a lot of new skin speckled trout and in fact there's more new skin that there are open areas of weeping and drainage at this point. This is good news. With regard to the right lower extremity this is doing much better with the only open area that I really see being a dry spot on the right lateral ankle currently. Fortunately there's no signs of active infection at this time which is good news. No fevers, chills, nausea, or vomiting noted at this time. The patient seems somewhat stressed and overwhelmed during the visit today she was very lethargic as such. She does and she is not taking any pain medications at this point. Apparently according to her husband are also in the process of moving which is probably taking its toll on her as well. 10/06/18 on evaluation today patient appears to be doing rather well in regard to her lower extremities compared to last evaluation. Fortunately there's no signs of active infection. She tells me she did have an appointment with her primary. Nonetheless he was concerned that the wounds were somewhat deep based on pictures but we never actually saw her legs. She states that he had her somewhat worried due to the fact that she was fearing now that she was [place] have to  have an amputation. With that being said based on what I'm seeing check she looks better this week that she has the last two times I've seen her with much less drainage I'm actually pleased in this regard. That doesn't mean that she's out of the water but again I do not think what the point of talking about education at all in regard to her leg. She is very happy to hear this. She is also not having as much pain as she was having last week. 10/13/18 unfortunately on evaluation today patient still continues to have a significant amount of drainage she's not letting [name] actually apply the compression dressings at this point. She's trying to use of Juxta-Lite of the top of Kerlex and the second layer of the three layer compression wrap. With that being said she just does not seem to be making as good a progress as I would expect if she was having the compression applied and in place on a regular basis.  No fevers, chills, nausea, or vomiting noted at this time. 10/20/18 on evaluation today patient appears to be doing a little better in regard to her bilateral lower extremity ulcers. In fact the right lower extremity seems to be healed she doesn't even have any openings at this point left lower extremity though still somewhat macerated seems to be showing signs of new skin growth at multiple locations throughout. Fortunately there's no evidence of active infection at this time. No fevers, chills, nausea, or vomiting noted at this time. 10/27/18 on evaluation today patient appears to be doing much better in regard to her left lower Trinity ulcer. She's been tolerating the laptop complication and has minimal drainage noted at this point. Fortunately there's no signs of active infection at this time. No fevers, chills, nausea, or vomiting noted at this time. 11/03/18 on evaluation today patient actually appears to be doing excellent in regard to her left lower extremity. She is having very little  drainage at this point there does not appear to be any significant signs of infection overall very pleased with how things have gone. She is likewise extremely pleased still and seems to be making wonderful progress week to week. I do believe antibiotics were helpful for her. Her primary care provider did place on amateur clean since I last saw her. 11/17/18 on evaluation today patient appears to be doing worse in regard to her bilateral lower extremities at this point. She is been tolerating the dressing changes without complication. With that being said she typically takes the Coban off fairly quickly upon arriving home even after being seen here in the clinic and does not allow [name] reapply command as part of the dressing at home. Therefore she said no compression essentially since I last saw her as best I can tell. With that being said I think it shows and how much swelling she has in the open wounds that are noted at this point. Fortunately there's no signs of infection but unfortunately if she doesn't get this under control I think she will end up with infection and more significant issues. 11/24/18 on evaluation today patient actually appears to be doing somewhat better in regard to her bilateral lower extremities. 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 Nonetheless that is doing better currently so she should be able to attempt reinitiating the lymphedema pumps at this time. No fevers, chills, nausea, or vomiting noted at this time. 12/01/18 upon evaluation today patient's left lower extremity appears to be doing quite well unfortunately her right lower extremity is not doing nearly as well. She has been tolerating the dressing changes without complication unfortunately she did not keep a wrap on the right at this time. Nonetheless I believe  this has led to increased swelling and weeping in the world is actually much larger than during the last evaluation with her. 12/08/18 on evaluation today patient appears to be doing about the same at this point in regard to her right lower extremity. There is some more palatable to touch I'm concerned about the possibility of there being some infection although I think the main issue is she's not keeping her compression wrap on which in turn is not allowing this area to heal appropriately. 12/22/18 on evaluation today patient appears to be doing better in regard to left  lower extremity unfortunately significantly worse in regard to the right lower extremity. The areas of blistering and necrotic superficial tissue have spread and again this does not really appear to be signs of infection and all she just doesn't seem to be doing nearly as well is what she has been in the past. Overall I feel like the Augmentin did absolutely nothing for her she doesn't seem to have any infection again I really didn't think so last time either is more of a potential preventative measure and hoping that this would make some difference but I think the main issue is she's not wearing her compression. She tells me she cannot wear the [place] been we put on she takes it off pretty much upon getting home. Subsequently she worshiped Juxta-Lite when I questioned her about how often she wears it this is no more than three hours a day obviously that leaves 21 hours that she has no compression and this is obviously not doing well for her. Overall I'm concerned that if things continue to worsen she is at great risk of both infection as well as losing her leg. 01/05/19 on evaluation today patient appears to be doing well in regard to her left lower extremity which he is allowing [place] to wrap and not so well with regard to her right lower extremity which she is not allowing [place] to really wrap and keep the wrap on. She states that it  hurts too badly whenever it's wrapped and she ends up having to take it off. She's been using the Juxta-Lite she tells me up to six hours a day although I question whether or not that's really been the case to be honest. Previously she told me three hours today nonetheless obviously the legs as long as the wrap is doing great when she is not is doing much more poorly. 01/12/2019 on evaluation today patient actually appears to be doing a little better in my opinion with regard to her right lower extremity ulcer. She has a small open area on the left lower extremity unfortunately but again this I think is part of the normal fluctuation of what she is going to have to expect with regard to her legs especially when she is not using her lymphedema pumps on a regular basis. Subsequently based on what I am seeing today I think that she does seem to be doing slightly better with regard to her right lower extremity she did see her primary care provider on Monday they felt she had an infection and placed her on 2 antibiotics. Both Cipro and clindamycin. Subsequently again she seems possibly to be doing a little bit better in regards to the right lower extremity she also tells me however she has been wearing the compression wrap over the past week since I spoke with her as well that is a Kerlix and Coban wrap on the right. No fevers, chills, nausea, vomiting, or diarrhea. 01/19/2019 on evaluation today patient appears to be doing better with regard to her bilateral lower extremities especially the right. I feel like the compression has been beneficial for her which is great news. She did get a call from her primary care provider on her way here today telling her that she did have methicillin-resistant Staphylococcus aureus and he was calling in a couple new antibiotics for her including a ointment to be applied she tells me 3 times a day. With that being said this sounds like likely to be Bactroban which I think  could be applied with each dressing/wrap change but I would not be able to accommodate her applying this 3 times a day. She is in agreement with the least doing this we will add that to her orders today. 01/26/2019 on evaluation today patient actually appears to be doing much better with regard to her right lower extremity. Her left lower extremity is also doing quite well all things considering. Fortunately there is no evidence of active infection at this time. No fevers, chills, nausea, vomiting, or diarrhea. 02/02/2019 on evaluation today patient appears to be doing much better compared to her last evaluation. Little by little off like her right leg is returning more towards normal. There does not appear to be any signs of active infection and overall she seems to be doing quite well which is great news. I am very pleased in this regard. No fevers, chills, nausea, vomiting, or diarrhea. 02/09/2019 upon evaluation today patient appears to be doing better with regard to her bilateral lower extremities. She has been tolerating the dressing changes without complication. Fortunately there is no signs of active infection at this time. No fevers, chills, nausea, vomiting, or diarrhea. 02/23/2019 on evaluation today patient actually appears to be doing quite well with regard to her bilateral lower extremities. She has been tolerating the dressing changes without complication. She is even used her pumps one time and states that she really felt like it felt good. With that being said she seems to be in good spirits and her legs appear to be doing excellent. 03/09/2019 on evaluation today patient appears to be doing well with regard to her right lower extremity there are no open wounds at this time she is having some discomfort but I feel like this is more neuropathy than anything. With regard to her left lower extremity she had several areas scattered around that she does have some weeping and drainage from  but again overall she does not appear to be having any significant issues and no evidence of infection at this time which is good news. 03/23/2019 on evaluation today patient appears to be doing well with regard to her right lower extremity which she tells me is still close she is using her juxta light here. Her left lower extremity she mainly just has an area on the foot which is still slightly draining although this also is doing great. Overall very pleased at this time. 04/06/2019 patient appears to be doing a little bit worse in regard to her left lower extremity upon evaluation today. She feels like this could be becoming infected again which she had issues with previous. Fortunately there is no signs of systemic infection but again this is always a struggle with her with her legs she will go from doing well to not so well in a very short amount of time. 04/20/2019 on evaluation today patient actually appears to be doing quite well with regard to her right lower extremity I do not see any signs of active infection at this time. Fortunately there is no fever chills noted. She is still taking the antibiotics which I prescribed for her at this point. In regard to the left lower extremity I do feel like some of these areas are better although again she still is having weeping from several locations at this time. 04/27/2019 on evaluation today patient appears to be doing about the same if not slightly worse in regard to her left lower extremity ulcers. She tells me when questioned that she has been  sleeping in her Hoveround chair in fact she tells me she falls asleep without even knowing it. I think she is spending a whole lot of time in the chair and less time walking and moving around which is not good for her legs either. On top of that she is in a seated position which is also the worst position she is not really elevating her legs and she is also not using her lymphedema pumps. All this is good  to contribute to worsening of her condition in general. 05/18/2019 on evaluation today patient appears to be doing well with regard to her lower extremity on the right in fact this is showing no signs of any open wounds at this time. On the left she is continuing to have issues with areas that do drain. Some of the regions have healed and there are couple areas that have reopened. She did go to the ER per the patient according to recommendations from the [name] nurse due to what she was seen when she came out on 05/13/2019. Subsequently she felt like the patient needed to go to the hospital due to the fact that again she was having "milky white discharge" from her leg. Nonetheless she had and then was placed on doxycycline and subsequently seems to be doing better. Patient History Information obtained from Patient. Family History Cancer - Siblings, Hypertension - Siblings, Stroke - Father, No family history of Diabetes, Heart Disease, Hereditary Spherocytosis, Kidney Disease, Lung Disease, Seizures, Thyroid Problems, Tuberculosis. Social History Never smoker, Marital Status - Married, Alcohol Use - Never, Drug Use - No History, Caffeine Use - Never. Medical History Eyes Denies history of Cataracts, Glaucoma, Optic Neuritis Ear/Nose/Mouth/Throat Denies history of Chronic sinus problems/congestion, Middle ear problems Hematologic/Lymphatic Denies history of Anemia, Hemophilia, Human Immunodeficiency Virus, Lymphedema, Sickle Cell Disease Respiratory Patient has history of Asthma Denies history of Aspiration, Chronic Obstructive Pulmonary Disease (COPD), Pneumothorax, Sleep Apnea, Tuberculosis Cardiovascular Patient has history of Hypertension, Peripheral Arterial Disease, Peripheral Venous Disease - chronic Denies history of Angina, Arrhythmia, Congestive Heart Failure, Coronary Artery Disease, Deep Vein Thrombosis, Hypotension, Myocardial Infarction, Phlebitis,  Vasculitis Endocrine Patient has history of Type II Diabetes Genitourinary Denies history of End Stage Renal Disease Immunological Denies history of Lupus Erythematosus, Raynaudoos, Scleroderma Musculoskeletal Patient has history of Gout, Osteoarthritis Denies history of Rheumatoid Arthritis, Osteomyelitis Neurologic Denies history of Dementia, Neuropathy, Quadriplegia, Paraplegia, Seizure Disorder Psychiatric Denies history of Anorexia/bulimia, Confinement Anxiety Hospitalization/Surgery History - leg cellulitis. Medical And Surgical History Notes Constitutional Symptoms (General [name]) morbid obesity, wheelchair dependent Integumentary (Skin) LLL cellulitis , wound cx grew E. Coli Psychiatric insomnia , adjustment disorder, depression with anxiety Review of Systems (ROS) Constitutional Symptoms (General [name]) Denies complaints or symptoms of Fatigue, Fever, Chills, Marked Weight Change. Respiratory Denies complaints or symptoms of Chronic or frequent coughs, Shortness of Breath. Cardiovascular Denies complaints or symptoms of Chest pain. Objective Constitutional Well-nourished and well-hydrated in no acute distress. Vitals Time Taken: 1:23 PM, Height: 62 in, Weight: 335 lbs, BMI: 61.3, Temperature: 98.5 F, Pulse: 106 bpm, Respiratory Rate: 20 breaths/min, Blood Pressure: 158/73 mmHg, Capillary Blood Glucose: 144 mg/dl. Respiratory normal breathing without difficulty. clear to auscultation bilaterally. Cardiovascular regular rate and rhythm with normal S1, S2. Psychiatric this patient is able to make decisions and demonstrates good insight into disease process. Alert and Oriented x 3. pleasant and cooperative. General Notes: Upon inspection today patient's wounds actually are showing signs of good epithelization in a lot of areas and she has really  minimal drainage. She still has a lot of very dry and crusty skin over the majority of her lower extremity on the left  much greater than on the right. There was no sharp debridement performed today. Integumentary (Hair, Skin) Wound #57 status is Open. Original cause of wound was Gradually Appeared. The wound is located on the Left,Dorsal Foot. The wound measures 0cm length x 0cm width x 0cm depth; 0cm^2 area and 0cm^3 volume. Wound #58 status is Open. Original cause of wound was Blister. The wound is located on the Left,Medial Ankle. The wound measures 0.2cm length x 4cm width x 0.1cm depth; 0.628cm^2 area and 0.063cm^3 volume. There is Fat Layer (Subcutaneous Tissue) Exposed exposed. There is no tunneling or undermining noted. There is a small amount of serosanguineous drainage noted. The wound margin is distinct with the outline attached to the wound base. There is large (67-100%) pink granulation within the wound bed. There is a small (1-33%) amount of necrotic tissue within the wound bed including Adherent Slough. Wound #59 status is Open. Original cause of wound was Gradually Appeared. The wound is located on the Left,Medial Lower Leg. The wound measures 0cm length x 0cm width x 0cm depth; 0cm^2 area and 0cm^3 volume. Wound #60 status is Open. Original cause of wound was Gradually Appeared. The wound is located on the Left,Posterior Lower Leg. The wound measures 0cm length x 0cm width x 0cm depth; 0cm^2 area and 0cm^3 volume. Wound #61 status is Open. Original cause of wound was Gradually Appeared. The wound is located on the Left,Anterior Lower Leg. The wound measures 1cm length x 0.5cm width x 0.1cm depth; 0.393cm^2 area and 0.039cm^3 volume. There is Fat Layer (Subcutaneous Tissue) Exposed exposed. There is no tunneling or undermining noted. There is a small amount of serosanguineous drainage noted. The wound margin is flat and intact. There is large (67-100%) pink granulation within the wound bed. There is no necrotic tissue within the wound bed. Wound #62 status is Open. Original cause of wound was  Gradually Appeared. The wound is located on the Left,Proximal,Medial Lower Leg. The wound measures 0.9cm length x 0.8cm width x 0.1cm depth; 0.565cm^2 area and 0.057cm^3 volume. There is no tunneling or undermining noted. There is a medium amount of serosanguineous drainage noted. The wound margin is distinct with the outline attached to the wound base. There is large (67-100%) red, pink granulation within the wound bed. There is no necrotic tissue within the wound bed. Assessment Active Problems ICD-10 Type 2 diabetes mellitus with other skin ulcer Lymphedema, not elsewhere classified Chronic venous hypertension (idiopathic) with ulcer and inflammation of right lower extremity Chronic venous hypertension (idiopathic) with ulcer and inflammation of left lower extremity 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 leg with fat layer exposed Non-pressure chronic ulcer of other part of left lower leg with fat layer exposed Essential (primary) hypertension Morbid (severe) obesity due to excess calories Other specified anxiety disorders Weakness Procedures Wound #61 Pre-procedure diagnosis of Wound #61 is a Venous Leg Ulcer located on the Left,Anterior Lower Leg . There was a Three Layer Compression Therapy Procedure by Yevonne Pax, RN. Post procedure Diagnosis Wound #61: Same as Pre-Procedure Plan Follow-up Appointments: Return Appointment in 2 weeks. Dressing Change Frequency: Other: - 2 times per week Wound Cleansing: Clean wound with Wound Cleanser - all wounds May shower with protection. Primary Wound Dressing: Wound #58 Left,Medial Ankle: Calcium Alginate with Silver Wound #61 Left,Anterior Lower Leg: Calcium Alginate with Silver Wound #62 Left,Proximal,Medial Lower  Leg: Calcium Alginate with Silver Secondary Dressing: Dry Gauze - all wounds ABD pad - as needed Edema Control: 3 Layer Compression System - Left Lower Extremity Avoid standing for long periods of  time - walking is encouraged Elevate legs to the level of the heart or above for 30 minutes daily and/or when sitting, a frequency of: - do not sleep in chair with feet dangling Exercise regularly Support Garment 20-30 mm/Hg pressure to: - Juxtalite to right lower leg daily Segmental Compressive Device. - lymphedema pumps 60 minutes 1- 2 times per day Additional Orders / Instructions: Follow Nutritious Diet - To include vitamin A, vitamin C, and Zinc along with increased protein intake. Home [name]: Continue [name] skilled nursing for wound care. - [facility]. My suggestion at this time is good to be that we go ahead and continue with the current wound care measures which includes utilization of silver alginate to the wound beds at this point. 2. I am also going to suggest that we continue with the 3 layer compression wrap on the left. I think that is doing a good job for the patient. 3. I do recommend that she continue to try to use her pumps for 1 hour 1-2 times a day but at least 1 time a day would be better if she can. We will see patient back for reevaluation in 2 weeks here in the clinic. If anything worsens or changes patient will contact our office for additional recommendations. Electronic Signature(s) Signed: 05/19/2019 10:36:32 AM By: Lenda Kelp PA-C Entered By: Lenda Kelp on 05/18/2019 13:53:25 -------------------------------------------------------------------------------- HxROS Details Patient Name: Date of Service: [name]. 05/18/2019 1:00 PM Medical Record [id number] Patient Account Number: [id number] Date of Birth/Sex: Treating RN: 09-19-1950 (68 y.o. [name] Primary Care Provider: Fatima Sanger Other Clinician: Referring Provider: Treating Provider/Extender:[name] Weeks in Treatment: 19 Information Obtained From Patient Constitutional Symptoms (General [name]) Complaints and Symptoms: Negative for:  Fatigue; Fever; Chills; Marked Weight Change Medical History: Past Medical History Notes: morbid obesity, wheelchair dependent Respiratory Complaints and Symptoms: Negative for: Chronic or frequent coughs; Shortness of Breath Medical History: Positive for: Asthma Negative for: Aspiration; Chronic Obstructive Pulmonary Disease (COPD); Pneumothorax; Sleep Apnea; Tuberculosis Cardiovascular Complaints and Symptoms: Negative for: Chest pain Medical History: Positive for: Hypertension; Peripheral Arterial Disease; Peripheral Venous Disease - chronic Negative for: Angina; Arrhythmia; Congestive Heart Failure; Coronary Artery Disease; Deep Vein Thrombosis; Hypotension; Myocardial Infarction; Phlebitis; Vasculitis Eyes Medical History: Negative for: Cataracts; Glaucoma; Optic Neuritis Ear/Nose/Mouth/Throat Medical History: Negative for: Chronic sinus problems/congestion; Middle ear problems Hematologic/Lymphatic Medical History: Negative for: Anemia; Hemophilia; Human Immunodeficiency Virus; Lymphedema; Sickle Cell Disease Endocrine Medical History: Positive for: Type II Diabetes Time with diabetes: 2005 Treated with: Insulin Blood sugar tested every day: No Blood sugar testing results: Breakfast: 155; Lunch: 155; Dinner: 160; Bedtime: 155 Genitourinary Medical History: Negative for: End Stage Renal Disease Immunological Medical History: Negative for: Lupus Erythematosus; Raynauds; Scleroderma Integumentary (Skin) Medical History: Past Medical History Notes: LLL cellulitis , wound cx grew E. Coli Musculoskeletal Medical History: Positive for: Gout; Osteoarthritis Negative for: Rheumatoid Arthritis; Osteomyelitis Neurologic Medical History: Negative for: Dementia; Neuropathy; Quadriplegia; Paraplegia; Seizure Disorder Psychiatric Medical History: Negative for: Anorexia/bulimia; Confinement Anxiety Past Medical History Notes: insomnia , adjustment disorder, depression  with anxiety Immunizations Pneumococcal Vaccine: Received Pneumococcal Vaccination: Yes Implantable Devices No devices added Hospitalization / Surgery History Type of Hospitalization/Surgery leg cellulitis Family and Social History Cancer: Yes - Siblings; Diabetes: No; Heart Disease:  No; Hereditary Spherocytosis: No; Hypertension: Yes - Siblings; Kidney Disease: No; Lung Disease: No; Seizures: No; Stroke: Yes - Father; Thyroid Problems: No; Tuberculosis: No; Never smoker; Marital Status - Married; Alcohol Use: Never; Drug Use: No History; Caffeine Use: Never; Financial Concerns: No; Food, Clothing or Shelter Needs: No; Support System Lacking: No; Transportation Concerns: No Physician Affirmation I have reviewed and agree with the above information. Electronic Signature(s) Signed: 05/18/2019 6:22:02 PM By: Zenaida Deed RN, BSN Signed: 05/19/2019 10:36:32 AM By: Lenda Kelp PA-C Entered By: Lenda Kelp on 05/18/2019 13:52:39 -------------------------------------------------------------------------------- SuperBill Details Patient Name: Date of Service: [name] 05/18/2019 Medical Record [id number] Patient Account Number: [id number] Date of Birth/Sex: Treating RN: [birth year] (68 y.o. [name] Primary Care Provider: Fatima Sanger Other Clinician: Referring Provider: Treating Provider/Extender:[name] Weeks in Treatment: 19 Diagnosis Coding ICD-10 Codes Code Description E11.622 Type 2 diabetes mellitus with other skin ulcer I89.0 Lymphedema, not elsewhere classified I87.331 Chronic venous hypertension (idiopathic) with ulcer and inflammation of right lower extremity I87.332 Chronic venous hypertension (idiopathic) with ulcer and inflammation of left lower extremity L97.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 leg with fat layer exposed L97.822 Non-pressure chronic ulcer of other part of left lower leg with fat  layer exposed I10 Essential (primary) hypertension E66.01 Morbid (severe) obesity due to excess calories F41.8 Other specified anxiety disorders R53.1 Weakness Facility Procedures CPT4 Code Description: 08657846 (Facility Use Only) [id number] - APPLY MULTLAY COMPRS LWR LT LEG Modifier: Quantity: 1 Physician Procedures CPT4 Code Description: [id number] [id number] - WC PHYS LEVEL 4 - EST PT ICD-10 Diagnosis Description E11.622 Type 2 diabetes mellitus with other skin ulcer I89.0 Lymphedema, not elsewhere classified I87.331 Chronic venous hypertension (idiopathic) with ulcer and  lower extremity I87.332 Chronic venous hypertension (idiopathic) with ulcer and extremity Modifier: inflammation of inflammation of Quantity: 1 right left lower Electronic Signature(s) Signed: 05/19/2019 10:36:32 AM By: Lenda Kelp PA-C Entered By: Lenda Kelp on 05/18/2019 13:56:35

## 2019-05-24 NOTE — Progress Notes (Signed)
ANAVI, BRANSCUM ([id number]) Visit Report for 04/20/2019 Arrival Information Details Patient Name: Date of Service: [name], [name] 04/20/2019 9:30 AM Medical Record [id number] Patient Account Number: [id number] Date of Birth/Sex: Treating RN: 1950/09/23 (68 y.o. [name] Primary Care Jule Whitsel: [name] Other Clinician: Referring Medardo Hassing: Treating Trejon Duford/Extender:Stone III, [name], FRED Weeks in Treatment: 60 Visit Information History Since Last Visit Added or deleted any medications: No Patient Arrived: Wheel Chair Any new allergies or adverse reactions: No Arrival Time: 10:32 Had a fall or experienced change in No activities of daily living that may affect Accompanied By: husband risk of falls: Transfer Assistance: None Signs or symptoms of abuse/neglect since last No Patient Identification Verified: Yes visito Secondary Verification Process Completed: Yes Hospitalized since last visit: No Patient Requires Transmission-Based No Implantable device outside of the clinic excluding No Precautions: cellular tissue based products placed in the center Patient Has Alerts: Yes since last visit: Patient Alerts: R ABI= Has Dressing in Place as Prescribed: Yes 1.01 Pain Present Now: Yes L ABI = .99 Electronic Signature(s) Signed: 05/24/2019 3:00:22 PM By: Sandre Kitty Entered By: Sandre Kitty on 04/20/2019 10:32:49 -------------------------------------------------------------------------------- Compression Therapy Details Patient Name: Date of Service: [name]. 04/20/2019 9:30 AM Medical Record [id number] Patient Account Number: [id number] Date of Birth/Sex: Treating RN: 1950-09-07 (68 y.o. [name] Primary Care Sebastiana Wuest: [name] Other Clinician: Referring Ceasia Elwell: Treating Ayannah Faddis/Extender:Stone III, [name], FRED Weeks in Treatment: 32 Compression Therapy Performed for Wound Wound #58 Left,Medial  Ankle Assessment: Performed By: Clinician Deon Pilling, RN Compression Type: Three Layer Post Procedure Diagnosis Same as Pre-procedure Electronic Signature(s) Signed: 04/20/2019 6:42:58 PM By: [name] RN, BSN Entered By: [name] on 04/20/2019 11:26:08 -------------------------------------------------------------------------------- Encounter Discharge Information Details Patient Name: Date of Service: [name]. 04/20/2019 9:30 AM Medical Record [id number] Patient Account Number: [id number] Date of Birth/Sex: Treating RN: [birth year] (67 y.o. [name] Primary Care Taevion Sikora: [name] Other Clinician: Referring Aava Deland: Treating Mykelle Cockerell/Extender:Stone III, [name], FRED Weeks in Treatment: 57 Encounter Discharge Information Items Discharge Condition: Stable Ambulatory Status: Wheelchair Discharge Destination: Home Transportation: Private Auto Accompanied By: self Schedule Follow-up Appointment: Yes Clinical Summary of Care: Electronic Signature(s) Signed: 04/20/2019 6:30:51 PM By: Deon Pilling Entered By: Deon Pilling on 04/20/2019 15:03:38 -------------------------------------------------------------------------------- Lower Extremity Assessment Details Patient Name: Date of Service: [name], [name] 04/20/2019 9:30 AM Medical Record [id number] Patient Account Number: [id number] Date of Birth/Sex: Treating RN: 06/30/1950 (67 y.o. [name] Primary Care Sharunda Salmon: [name] Other Clinician: Referring Anaiza Behrens: Treating Rainer Mounce/Extender:Stone III, [name], FRED Weeks in Treatment: 11 Edema Assessment Assessed: [Left: Yes] [Right: Yes] Edema: [Left: Yes] [Right: No] Calf Left: Right: Point of Measurement: 37 cm From Medial Instep 35 cm 36.5 cm Ankle Left: Right: Point of Measurement: 10 cm From Medial Instep 26.5 cm 22 cm Electronic Signature(s) Signed: 04/20/2019 6:30:51 PM By: Deon Pilling Entered By:  Deon Pilling on 04/20/2019 10:37:31 -------------------------------------------------------------------------------- [place] Details Patient Name: Date of Service: [name]. 04/20/2019 9:30 AM Medical Record [id number] Patient Account Number: [id number] Date of Birth/Sex: Treating RN: 10-May-1951 (67 y.o. [name] Primary Care Aaryav Hopfensperger: [name] Other Clinician: Referring Cheril Slattery: Treating Lanise Mergen/Extender:Stone III, [name], FRED Weeks in Treatment: 28 Active Inactive Venous Leg Ulcer Nursing Diagnoses: Actual venous Insuffiency (use after diagnosis is confirmed) Knowledge deficit related to disease process and management Goals: Patient will maintain optimal edema control Date Initiated: 08/12/2017 Target Resolution Date: 05/06/2019 Goal Status: Active Patient/caregiver will verbalize understanding of disease  process and disease management Date Initiated: 08/12/2017 Date Inactivated: 04/21/2018 Target Resolution Date: 04/24/2018 Goal Status: Met Interventions: Assess peripheral edema status every visit. Compression as ordered Treatment Activities: Therapeutic compression applied : 08/12/2017 Notes: Wound/Skin Impairment Nursing Diagnoses: Impaired tissue integrity Knowledge deficit related to ulceration/compromised skin integrity Goals: Patient/caregiver will verbalize understanding of skin care regimen Date Initiated: 08/12/2017 Target Resolution Date: 05/06/2019 Goal Status: Active Ulcer/skin breakdown will have a volume reduction of 30% by week 4 Date Initiated: 08/05/2017 Date Inactivated: 09/30/2017 Target Resolution Date: 10/03/2017 Goal Status: Met Ulcer/skin breakdown will have a volume reduction of 50% by week 8 Date Initiated: 09/30/2017 Date Inactivated: 10/28/2017 Target Resolution Date: 10/28/2017 Goal Status: Met Interventions: Assess patient/caregiver ability to perform ulcer/skin care regimen upon admission  and as needed Assess ulceration(s) every visit Provide education on ulcer and skin care Screen for HBO Treatment Activities: Patient referred to home care : 08/05/2017 Skin care regimen initiated : 08/05/2017 Topical wound management initiated : 08/05/2017 Notes: Electronic Signature(s) Signed: 04/20/2019 6:42:58 PM By: [name] RN, BSN Entered By: [name] on 04/20/2019 11:25:18 -------------------------------------------------------------------------------- Pain Assessment Details Patient Name: Date of Service: [name]. 04/20/2019 9:30 AM Medical Record [id number] Patient Account Number: [id number] Date of Birth/Sex: Treating RN: September 11, 1950 (68 y.o. [name] Primary Care Alford Gamero: [name] Other Clinician: Referring Elijahjames Fuelling: Treating Keyasia Jolliff/Extender:Stone III, [name], FRED Weeks in Treatment: 54 Active Problems Location of Pain Severity and Description of Pain Patient Has Paino No Site Locations Pain Management and Medication Current Pain Management: Electronic Signature(s) Signed: 04/20/2019 6:42:58 PM By: [name] RN, BSN Signed: 05/24/2019 3:00:22 PM By: Sandre Kitty Entered By: Sandre Kitty on 04/20/2019 10:33:15 -------------------------------------------------------------------------------- Patient/Caregiver Education Details Patient Name: Date of Service: [name] 11/4/2020andnbsp9:30 AM Medical Record Patient Account Number: [id number] Number: Treating RN: [name] Date of Birth/Gender: May 22, 1951 (67 y.o. Other Clinician: F) Treating [name] Primary Care Physician: [name] Physician/Extender: Referring Physician: Wilhelmenia Blase in Treatment: 10 Education Assessment Education Provided To: Patient Education Topics Provided Venous: Methods: Explain/Verbal Responses: Reinforcements needed, State content correctly Wound/Skin Impairment: Methods:  Explain/Verbal Responses: Reinforcements needed, State content correctly Electronic Signature(s) Signed: 04/20/2019 6:42:58 PM By: [name] RN, BSN Entered By: [name] on 04/20/2019 11:25:45 -------------------------------------------------------------------------------- Wound Assessment Details Patient Name: Date of Service: [name]. 04/20/2019 9:30 AM Medical Record [id number] Patient Account Number: [id number] Date of Birth/Sex: Treating RN: 1951-01-27 (67 y.o. [name] Primary Care Rebel Laughridge: [name] Other Clinician: Referring Jaquarius Seder: Treating Kaston Faughn/Extender:Stone III, [name], FRED Weeks in Treatment: 31 Wound Status Wound Number: 57 Primary Diabetic Wound/Ulcer of the Lower Extremity Etiology: Wound Location: Left Foot - Dorsal Wound Open Wounding Event: Gradually Appeared Status: Date Acquired: 02/04/2019 Comorbid Asthma, Hypertension, Peripheral Arterial Weeks Of Treatment: 10 History: Disease, Peripheral Venous Disease, Type II Clustered Wound: No Diabetes, Gout, Osteoarthritis Photos Wound Measurements Length: (cm) 3.2 % Reduction i Width: (cm) 3 % Reduction i Depth: (cm) 0.1 Epithelializa Area: (cm) 7.54 Tunneling: Volume: (cm) 0.754 Undermining: Wound Description Classification: Grade 2 Foul Odor Af Wound Margin: Distinct, outline attached Slough/Fibri Exudate Amount: Small Exudate Type: Serosanguineous Exudate Color: red, brown Wound Bed Granulation Amount: Medium (34-66%) Granulation Quality: Pink, Pale Fascia Expose Necrotic Amount: Medium (34-66%) Fat Layer (Su Necrotic Quality: Adherent Slough Tendon Expose Muscle Expose Joint Exposed Bone Exposed: Electronic Signature(s) Signed: 04/25/2019 3:33:55 PM By: Deon Pilling Signed: 04/25/2019 4:06:15 PM By: Mikeal Hawthorne EMT/HBOT Previous Signature: 04/20/2019 6:30:51 PM Version By: Lady Deutscher Entered By:  Mikeal Hawthorne on 11/09 ter Cleansing:  No no Yes Exposed Structure d: No bcutaneous Tissue) Exposed: Yes d: No d: No : No No i /2020 11:46:05 n Area: -2301.3% n Volume: -2332.3% tion: Small (1-33%) No No -------------------------------------------------------------------------------- Wound Assessment Details Patient Name: Date of Service: [name], [name] 04/20/2019 9:30 AM Medical Record [id number] Patient Account Number: [id number] Date of Birth/Sex: Treating RN: 09-11-1950 (68 y.o. [name] Primary Care Amron Guerrette: [name] Other Clinician: Referring Miakoda Mcmillion: Treating Markice Torbert/Extender:Stone III, [name], FRED Weeks in Treatment: 23 Wound Status Wound Number: 58 Primary Diabetic Wound/Ulcer of the Lower Extremity Etiology: Wound Location: Left Ankle - Medial Wound Open Wounding Event: Blister Status: Date Acquired: 04/04/2019 Comorbid Asthma, Hypertension, Peripheral Arterial Weeks Of Treatment: 2 History: Disease, Peripheral Venous Disease, Type II Clustered Wound: No Diabetes, Gout, Osteoarthritis Photos Wound Measurements Length: (cm) 1.8 Width: (cm) 3.6 Depth: (cm) 0.1 Area: (cm) 5.089 Volume: (cm) 0.509 Wound Description Classification: Grade 2 Wound Margin: Distinct, outline attached Exudate Amount: Small Exudate Type: Serosanguineous Exudate Color: red, brown Wound Bed Granulation Amount: Large (67-100%) Granulation Quality: Pink Necrotic Amount: Small (1-33%) Necrotic Quality: Adherent Slough After Cleansing: No brino Yes Exposed Structure osed: No (Subcutaneous Tissue) Exposed: Yes osed: No osed: No sed: No ed: No % Reduction in Area: 10% % Reduction in Volume: 9.9% Epithelialization: None Tunneling: No Undermining: No Foul Odor Slough/Fi Fascia Exp Fat Layer Tendon Exp Muscle Exp Joint Expo Bone Expos Electronic Signature(s) Signed: 04/25/2019 3:33:55 PM By: Deon Pilling Signed: 04/25/2019 4:06:15 PM By: Mikeal Hawthorne EMT/HBOT Previous  Signature: 04/20/2019 6:30:51 PM Version By: Deon Pilling Entered By: Mikeal Hawthorne on 04/25/2019 11:36:28 -------------------------------------------------------------------------------- Wound Assessment Details Patient Name: Date of Service: [name]. 04/20/2019 9:30 AM Medical Record [id number] Patient Account Number: [id number] Date of Birth/Sex: Treating RN: 1951-04-04 (68 y.o. [name] Primary Care Lavoris Canizales: [name] Other Clinician: Referring Arzu Mcgaughey: Treating Merdis Snodgrass/Extender:Stone III, [name], FRED Weeks in Treatment: 46 Wound Status Wound Number: 59 Primary Diabetic Wound/Ulcer of the Lower Etiology: Extremity Wound Location: Left Lower Leg - Medial Secondary Lymphedema Wounding Event: Gradually Appeared Etiology: Date Acquired: 04/20/2019 Wound Open Weeks Of Treatment: 0 Status: Clustered Wound: No Comorbid Asthma, Hypertension, Peripheral Arterial History: Disease, Peripheral Venous Disease, Type II Diabetes, Gout, Osteoarthritis Photos Wound Measurements Length: (cm) 1.6 % Reductio Width: (cm) 1 % Reductio Depth: (cm) 0.1 Epithelial Area: (cm) 1.257 Tunneling Volume: (cm) 0.126 Undermini Wound Description Classification: Grade 1 Wound Margin: Distinct, outline attached Exudate Amount: Medium Exudate Type: Serosanguineous Exudate Color: red, brown Wound Bed Granulation Amount: Large (67-100%) Granulation Quality: Red Necrotic Amount: None Present (0%) Foul Odor After Cleansing: No Slough/Fibrino No Exposed Structure Fascia Exposed: No Fat Layer (Subcutaneous Tissue) Exposed: No Tendon Exposed: No Muscle Exposed: No Joint Exposed: No Bone Exposed: No n in Area: 0% n in Volume: 0% ization: Small (1-33%) : No ng: No Electronic Signature(s) Signed: 04/25/2019 3:33:55 PM By: Deon Pilling Signed: 04/25/2019 4:06:15 PM By: Mikeal Hawthorne EMT/HBOT Previous Signature: 04/20/2019 6:30:51 PM Version By: Deon Pilling Entered By: Mikeal Hawthorne on 04/25/2019 11:46:56 -------------------------------------------------------------------------------- Wound Assessment Details Patient Name: Date of Service: [name]. 04/20/2019 9:30 AM Medical Record [id number] Patient Account Number: [id number] Date of Birth/Sex: Treating RN: 1951-05-01 (67 y.o. [name] Primary Care Jori Frerichs: [name] Other Clinician: Referring Garhett Bernhard: Treating Jylian Pappalardo/Extender:Stone III, [name], FRED Weeks in Treatment: 10 Wound Status Wound Number: 60 Primary Diabetic Wound/Ulcer of the Lower Etiology: Extremity Wound Location: Left Lower Leg - Posterior Secondary  Lymphedema Wounding Event: Gradually Appeared Etiology: Date Acquired: 04/20/2019 Wound Open Weeks Of Treatment: 0 Status: Clustered Wound: No Comorbid Asthma, Hypertension, Peripheral Arterial History: Disease, Peripheral Venous Disease, Type II Diabetes, Gout, Osteoarthritis Photos Wound Measurements Length: (cm) 4.8 % Reductio Width: (cm) 4.5 % Reductio Depth: (cm) 0.1 Epithelial Area: (cm) 16.965 Tunneling Volume: (cm) 1.696 Undermini Wound Description Classification: Grade 1 Wound Margin: Distinct, outline attached Exudate Amount: Medium Exudate Type: Serosanguineous Exudate Color: red, brown Wound Bed Granulation Amount: Large (67-100%) Granulation Quality: Red, Pink Necrotic Amount: Small (1-33%) Necrotic Quality: Adherent Slough Foul Odor After Cleansing: No Slough/Fibrino Yes Exposed Structure Fascia Exposed: No Fat Layer (Subcutaneous Tissue) Exposed: No Tendon Exposed: No Muscle Exposed: No Joint Exposed: No Bone Exposed: No n in Area: 0% n in Volume: 0% ization: Small (1-33%) : No ng: No Electronic Signature(s) Signed: 04/25/2019 3:33:55 PM By: Deon Pilling Signed: 04/25/2019 4:06:15 PM By: Mikeal Hawthorne EMT/HBOT Previous Signature: 04/20/2019 6:30:51 PM Version By: Deon Pilling Entered By:  Mikeal Hawthorne on 04/25/2019 11:47:28 -------------------------------------------------------------------------------- Wound Assessment Details Patient Name: Date of Service: [name]. 04/20/2019 9:30 AM Medical Record [id number] Patient Account Number: [id number] Date of Birth/Sex: Treating RN: 11/13/1950 (67 y.o. [name] Primary Care Thara Searing: [name] Other Clinician: Referring Ichael Pullara: Treating Dayanira Giovannetti/Extender:Stone III, [name], FRED Weeks in Treatment: 55 Wound Status Wound Number: 61 Primary Venous Leg Ulcer Etiology: Wound Location: Left Lower Leg - Anterior Wound Open Wounding Event: Gradually Appeared Status: Date Acquired: 04/20/2019 Comorbid Asthma, Hypertension, Peripheral Arterial Weeks Of Treatment: 0 History: Disease, Peripheral Venous Disease, Type II Clustered Wound: No Diabetes, Gout, Osteoarthritis Photos Wound Measurements Length: (cm) 6 % Reduct Width: (cm) 1.6 % Reduct Depth: (cm) 0.1 Epitheli Area: (cm) 7.54 Tunneli Volume: (cm) 0.754 Undermi Wound Description Classification: Full Thickness Without Exposed Support Foul Od Structures Slough/ Wound Flat and Intact Margin: Exudate Medium Amount: Exudate Serosanguineous Type: Exudate red, brown Color: Wound Bed Granulation Amount: Large (67-100%) Granulation Quality: Pink Fascia E Necrotic Amount: None Present (0%) Fat Laye Tendon E Muscle E Joint Ex Bone Exp or After Cleansing: No Fibrino No Exposed Structure xposed: No r (Subcutaneous Tissue) Exposed: Yes xposed: No xposed: No posed: No osed: No ion in Area: 0% ion in Volume: 0% alization: None ng: No ning: No Electronic Signature(s) Signed: 04/25/2019 4:06:15 PM By: Mikeal Hawthorne EMT/HBOT Signed: 04/26/2019 2:49:59 PM By: [name] RN, BSN Previous Signature: 04/20/2019 6:42:58 PM Version By: [name] RN, BSN Entered By: Mikeal Hawthorne on 04/25/2019  11:58:12 -------------------------------------------------------------------------------- Vitals Details Patient Name: Date of Service: [name]. 04/20/2019 9:30 AM Medical Record [id number] Patient Account Number: [id number] Date of Birth/Sex: Treating RN: 07/18/1950 (68 y.o. [name] Primary Care Thy Gullikson: [name] Other Clinician: Referring Agness Sibrian: Treating Carle Fenech/Extender:Stone III, [name], FRED Weeks in Treatment: 38 Vital Signs Time Taken: 10:32 Temperature (F): 98.3 Height (in): 62 Pulse (bpm): 114 Weight (lbs): 335 Respiratory Rate (breaths/min): 20 Body Mass Index (BMI): 61.3 Blood Pressure (mmHg): 156/94 Reference Range: 80 - 120 mg / dl Electronic Signature(s) Signed: 05/24/2019 3:00:22 PM By: Sandre Kitty Entered By: Sandre Kitty on 04/20/2019 10:33:10

## 2019-05-24 NOTE — Progress Notes (Signed)
LILLIAHNA, [name] ([id number]) Visit Report for 03/09/2019 Arrival Information Details Patient Name: Date of Service: [name], [name] 03/09/2019 10:00 AM Medical Record [id number] Patient Account Number: [id number] Date of Birth/Sex: Treating RN: [birth year] (68 y.o. [name] Primary Care Hargun Spurling: [name] Other Clinician: Sandre Kitty Referring Kymari Lollis: Treating Nikalas Bramel/Extender:Stone III, [name], FRED Weeks in Treatment: 58 Visit Information History Since Last Visit Ambulatory All ordered tests and consults were completed: No Patient Arrived: Added or deleted any medications: No Arrival Time: 10:12 Any new allergies or adverse reactions: No Accompanied By: self Had a fall or experienced change in No Transfer Assistance: None activities of daily living that may affect Patient Identification Verified: Yes risk of falls: Secondary Verification Process Yes Signs or symptoms of abuse/neglect since last No Completed: visito Patient Requires Transmission-Based No Hospitalized since last visit: No Precautions: Implantable device outside of the clinic excluding No Patient Has Alerts: Yes R ABI= 1.01 cellular tissue based products placed in the center Patient Alerts: L ABI = .99 since last visit: Has Dressing in Place as Prescribed: Yes Has Compression in Place as Prescribed: Yes Pain Present Now: Yes Electronic Signature(s) Signed: 05/24/2019 3:04:22 PM By: Carlene Coria RN Entered By: Carlene Coria on 03/09/2019 10:12:58 -------------------------------------------------------------------------------- Compression Therapy Details Patient Name: Date of Service: [name], [name] 03/09/2019 10:00 AM Medical Record [id number] Patient Account Number: [id number] Date of Birth/Sex: Treating RN: 1950/12/03 (67 y.o. [name] Primary Care [place] Perra: [name] Other Clinician: Sandre Kitty Referring Khrystina Bonnes: Treating Tiajuana Leppanen/Extender:Stone  III, [name], FRED Weeks in Treatment: 5 Compression Therapy Performed for Wound Wound #56 Left,Posterior Ankle Assessment: Performed By: Clinician Deon Pilling, RN Compression Type: Three Layer Post Procedure Diagnosis Same as Pre-procedure Electronic Signature(s) Signed: 03/09/2019 5:29:01 PM By: [name] RN, BSN Entered By: [name] on 03/09/2019 10:39:46 -------------------------------------------------------------------------------- Encounter Discharge Information Details Patient Name: Date of Service: [name], [name] 03/09/2019 10:00 AM Medical Record [id number] Patient Account Number: [id number] Date of Birth/Sex: Treating RN: [birth year] (67 y.o. [name] Primary Care Shalunda Lindh: [name] Other Clinician: Sandre Kitty Referring Kaydin Karbowski: Treating Elliyah Liszewski/Extender:Stone III, [name], FRED Weeks in Treatment: 37 Encounter Discharge Information Items Discharge Condition: Stable Ambulatory Status: Wheelchair Discharge Destination: Home Transportation: Private Auto Accompanied By: self Schedule Follow-up Appointment: Yes Clinical Summary of Care: Electronic Signature(s) Signed: 03/09/2019 5:12:23 PM By: Deon Pilling Entered By: Deon Pilling on 03/09/2019 11:05:02 -------------------------------------------------------------------------------- Lower Extremity Assessment Details Patient Name: Date of Service: [name], [name] 03/09/2019 10:00 AM Medical Record [id number] Patient Account Number: [id number] Date of Birth/Sex: Treating RN: [birth year] (67 y.o. [name] Primary Care Geena Weinhold: [name] Other Clinician: Sandre Kitty Referring Buena Boehm: Treating Churchill Grimsley/Extender:Stone III, [name], FRED Weeks in Treatment: 38 Edema Assessment Assessed: [Left: No] [Right: No] Edema: [Left: Yes] [Right: Yes] Calf Left: Right: Point of Measurement: 37 cm From Medial Instep 43 cm 39.5 cm Ankle Left: Right: Point  of Measurement: 10 cm From Medial Instep 27 cm 24.5 cm Electronic Signature(s) Signed: 05/24/2019 3:04:22 PM By: Carlene Coria RN Entered By: Carlene Coria on 03/09/2019 10:14:23 -------------------------------------------------------------------------------- Allen Details Patient Name: Date of Service: [name], [name] 03/09/2019 10:00 AM Medical Record [id number] Patient Account Number: [id number] Date of Birth/Sex: Treating RN: 1951/01/19 (67 y.o. [name] Primary Care Monty Spicher: [name] Other Clinician: Sandre Kitty Referring Shonette Rhames: Treating Margarine Grosshans/Extender:Stone III, [name], FRED Weeks in Treatment: 58 Active Inactive Venous Leg Ulcer Nursing Diagnoses: Actual venous Insuffiency (use after diagnosis is confirmed) Knowledge deficit related to disease process and management Goals: Patient  will maintain optimal edema control Date Initiated: 08/12/2017 Target Resolution Date: 04/06/2019 Goal Status: Active Patient/caregiver will verbalize understanding of disease process and disease management Date Initiated: 08/12/2017 Date Inactivated: 04/21/2018 Target Resolution Date: 04/24/2018 Goal Status: Met Interventions: Assess peripheral edema status every visit. Compression as ordered Treatment Activities: Therapeutic compression applied : 08/12/2017 Notes: Wound/Skin Impairment Nursing Diagnoses: Impaired tissue integrity Knowledge deficit related to ulceration/compromised skin integrity Goals: Patient/caregiver will verbalize understanding of skin care regimen Date Initiated: 08/12/2017 Target Resolution Date: 04/06/2019 Goal Status: Active Ulcer/skin breakdown will have a volume reduction of 30% by week 4 Date Initiated: 08/05/2017 Date Inactivated: 09/30/2017 Target Resolution Date: 10/03/2017 Goal Status: Met Ulcer/skin breakdown will have a volume reduction of 50% by week 8 Date Initiated: 09/30/2017 Date Inactivated:  10/28/2017 Target Resolution Date: 10/28/2017 Goal Status: Met Interventions: Assess patient/caregiver ability to perform ulcer/skin care regimen upon admission and as needed Assess ulceration(s) every visit Provide education on ulcer and skin care Screen for HBO Treatment Activities: Patient referred to home care : 08/05/2017 Skin care regimen initiated : 08/05/2017 Topical wound management initiated : 08/05/2017 Notes: Electronic Signature(s) Signed: 03/09/2019 5:29:01 PM By: [name] RN, BSN Entered By: [name] on 03/09/2019 10:18:18 -------------------------------------------------------------------------------- Pain Assessment Details Patient Name: Date of Service: Zorah, Backes 03/09/2019 10:00 AM Medical Record [id number] Patient Account Number: [id number] Date of Birth/Sex: Treating RN: 1951-05-08 (67 y.o. [name] Primary Care Aritha Huckeba: [name] Other Clinician: Sandre Kitty Referring Audrea Bolte: Treating Tayt Moyers/Extender:Stone III, [name], FRED Weeks in Treatment: 70 Active Problems Location of Pain Severity and Description of Pain Patient Has Paino Yes Site Locations With Dressing Change: Yes With Dressing Change: Yes Duration of the Pain. Constant / Intermittento Intermittent How Long Does it Lasto Hours: Minutes: 20 Rate the pain. Current Pain Level: 6 Worst Pain Level: 8 Least Pain Level: 0 Tolerable Pain Level: 5 Character of Pain Describe the Pain: Aching, Burning Pain Management and Medication Current Pain Management: Medication: Yes Cold Application: No Rest: Yes Massage: No Activity: No T.E.N.S.: No Heat Application: No Leg drop or elevation: No Is the Current Pain Management Adequate: Inadequate How does your wound impact your activities of daily livingo Sleep: No Bathing: No Appetite: No Relationship With Others: No Bladder Continence: No Emotions: No Bowel Continence: No Work: No Toileting:  No Drive: No Dressing: No Hobbies: No Electronic Signature(s) Signed: 05/24/2019 3:04:22 PM By: Carlene Coria RN Entered By: Carlene Coria on 03/09/2019 10:14:13 -------------------------------------------------------------------------------- Patient/Caregiver Education Details Patient Name: Avea, Mcgowen 9/23/2020andnbsp10:00 Date of Service: AM Medical Record [id number] Number: Patient Account Number: [id number] Treating RN: 1950-10-31 (67 y.o. [name] Date of Birth/Gender: F) Other Clinician: Sandre Kitty Primary Care Physician:SMITH, FRED Treating [name] Referring Physician: Physician/Extender: Wilhelmenia Blase in Treatment: 47 Education Assessment Education Provided To: Patient Education Topics Provided Venous: Methods: Explain/Verbal Responses: Reinforcements needed, State content correctly Wound/Skin Impairment: Methods: Explain/Verbal Responses: Reinforcements needed, State content correctly Electronic Signature(s) Signed: 03/09/2019 5:29:01 PM By: [name] RN, BSN Entered By: [name] on 03/09/2019 10:18:46 -------------------------------------------------------------------------------- Wound Assessment Details Patient Name: Date of Service: Deisy, Ozbun 03/09/2019 10:00 AM Medical Record [id number] Patient Account Number: [id number] Date of Birth/Sex: Treating RN: [birth year] (67 y.o. [name] Primary Care Heloise Gordan: [name] Other Clinician: Sandre Kitty Referring Limuel Nieblas: Treating Kosta Schnitzler/Extender:Stone III, [name], FRED Weeks in Treatment: 5 Wound Status Wound Number: 56 Primary Diabetic Wound/Ulcer of the Lower Extremity Etiology: Wound Location: Left Ankle - Posterior Wound Open Wounding Event: Gradually Appeared Status: Date Acquired: 01/12/2019  Comorbid Asthma, Hypertension, Peripheral Arterial Weeks Of Treatment: 8 History: Disease, Peripheral Venous Disease, Type II Clustered Wound:  No Diabetes, Gout, Osteoarthritis Photos Wound Measurements Length: (cm) 1.5 Width: (cm) 2.6 Depth: (cm) 0.1 Area: (cm) 3.063 Volume: (cm) 0.306 Wound Description Classification: Grade 2 Wound Margin: Indistinct, nonvisible Exudate Amount: Small Exudate Type: Serous Exudate Color: amber Wound Bed Granulation Amount: Medium (34-66%) Granulation Quality: Pink Necrotic Amount: Medium (34-66%) Necrotic Quality: Adherent Slough ter Cleansing: No no Yes Exposed Structure ed: No ubcutaneous Tissue) Exposed: Yes ed: No ed: No d: No : No % Reduction in Area: -116.6% % Reduction in Volume: -117% Epithelialization: Large (67-100%) Tunneling: No Undermining: No Foul Odor Af Slough/Fibri Fascia Expos Fat Layer (S Tendon Expos Muscle Expos Joint Expose Bone Exposed Electronic Signature(s) Signed: 03/11/2019 8:42:43 AM By: Mikeal Hawthorne EMT/HBOT Signed: 05/24/2019 3:04:22 PM By: Carlene Coria RN Entered By: Mikeal Hawthorne on 03/10/2019 08:34:03 -------------------------------------------------------------------------------- Wound Assessment Details Patient Name: Date of Service: Nabila, Albarracin 03/09/2019 10:00 AM Medical Record [id number] Patient Account Number: [id number] Date of Birth/Sex: Treating RN: Mar 20, 1951 (68 y.o. [name] Primary Care Charie Pinkus: [name] Other Clinician: Sandre Kitty Referring Terrence Pizana: Treating Weylyn Ricciuti/Extender:Stone III, [name], FRED Weeks in Treatment: 88 Wound Status Wound Number: 57 Primary Diabetic Wound/Ulcer of the Lower Extremity Etiology: Wound Location: Left Foot - Dorsal Wound Open Wounding Event: Gradually Appeared Status: Date Acquired: 02/04/2019 Comorbid Asthma, Hypertension, Peripheral Arterial Weeks Of Treatment: 4 History: Disease, Peripheral Venous Disease, Type II Clustered Wound: No Diabetes, Gout, Osteoarthritis Photos Wound Measurements Length: (cm) 4.5 Width: (cm) 6.5 Depth: (cm)  0.1 Area: (cm) 22.973 Volume: (cm) 2.297 Wound Description Classification: Grade 2 Exudate Amount: Small Exudate Type: Serosanguineous Exudate Color: red, brown Wound Bed Granulation Amount: Medium (34-66%) Granulation Quality: Pink, Pale Necrotic Amount: Medium (34-66%) Necrotic Quality: Adherent Slough fter Cleansing: No ino Yes Exposed Structure ed: No ubcutaneous Tissue) Exposed: Yes ed: No ed: No d: No : No % Reduction in Area: -7216.2% % Reduction in Volume: -7309.7% Epithelialization: None Tunneling: No Undermining: No Foul Odor A Slough/Fibr Fascia Expos Fat Layer (S Tendon Expos Muscle Expos Joint Expose Bone Exposed Electronic Signature(s) Signed: 03/11/2019 8:42:43 AM By: Mikeal Hawthorne EMT/HBOT Signed: 05/24/2019 3:04:22 PM By: Carlene Coria RN Entered By: Mikeal Hawthorne on 03/10/2019 08:34:33 -------------------------------------------------------------------------------- Vitals Details Patient Name: Date of Service: Korra, Christine 03/09/2019 10:00 AM Medical Record [id number] Patient Account Number: [id number] Date of Birth/Sex: Treating RN: [birth year] (68 y.o. [name] Primary Care Dalyn Becker: [name] Other Clinician: Sandre Kitty Referring Elbia Paro: Treating Baruc Tugwell/Extender:Stone III, [name], FRED Weeks in Treatment: 38 Vital Signs Time Taken: 10:13 Temperature (F): 98.4 Height (in): 62 Pulse (bpm): 94 Weight (lbs): 335 Respiratory Rate (breaths/min): 20 Body Mass Index (BMI): 61.3 Blood Pressure (mmHg): 155/83 Reference Range: 80 - 120 mg / dl Electronic Signature(s) Signed: 05/24/2019 3:04:22 PM By: Carlene Coria RN Entered By: Carlene Coria on 03/09/2019 10:13:31

## 2019-05-24 NOTE — Progress Notes (Signed)
[name], [name] ([id number]) Visit Report for 05/18/2019 Arrival Information Details Patient Name: Date of Service: [name], [name] 05/18/2019 1:00 PM Medical Record [id number] Patient Account Number: [id number] Date of Birth/Sex: Treating RN: 01-27-1951 (68 y.o. [name] Primary Care Kamiyah Kindel: [name] Other Clinician: Referring Shamecka Hocutt: Treating Jamelyn Bovard/Extender:Stone III, [name], FRED Weeks in Treatment: 20 Visit Information History Since Last Visit Added or deleted any medications: Yes Patient Arrived: Wheel Chair Any new allergies or adverse reactions: No Arrival Time: 13:17 Had a fall or experienced change in No activities of daily living that may affect Accompanied By: self risk of falls: Transfer Assistance: None Signs or symptoms of abuse/neglect since last No Patient Identification Verified: Yes visito Secondary Verification Process Yes Hospitalized since last visit: No Completed: Implantable device outside of the clinic excluding No Patient Requires Transmission-Based No cellular tissue based products placed in the center Precautions: since last visit: Patient Has Alerts: Yes Has Dressing in Place as Prescribed: Yes Patient Alerts: R ABI= Has Compression in Place as Prescribed: Yes 1.01 Pain Present Now: Yes L ABI = .99 Notes patient went to ED last week. Electronic Signature(s) Signed: 05/18/2019 6:36:09 PM By: Deon Pilling Entered By: Deon Pilling on 05/18/2019 13:19:39 -------------------------------------------------------------------------------- Compression Therapy Details Patient Name: Date of Service: [name], [name] 05/18/2019 1:00 PM Medical Record [id number] Patient Account Number: [id number] Date of Birth/Sex: Treating RN: 11-17-1950 (68 y.o. [name] Primary Care Hedda Crumbley: [name] Other Clinician: Referring Paighton Godette: Treating Lelar Farewell/Extender:Stone III, [name], FRED Weeks in  Treatment: 50 Compression Therapy Performed for Wound Wound #61 Left,Anterior Lower Leg Assessment: Performed By: Clinician Carlene Coria, RN Compression Type: Three Layer Post Procedure Diagnosis Same as Pre-procedure Electronic Signature(s) Signed: 05/18/2019 6:22:02 PM By: [name] RN, BSN Entered By: [name] on 05/18/2019 13:46:31 -------------------------------------------------------------------------------- Encounter Discharge Information Details Patient Name: Date of Service: [name] Duke. 05/18/2019 1:00 PM Medical Record [id number] Patient Account Number: [id number] Date of Birth/Sex: Treating RN: 18-Mar-1951 (68 y.o. [name] Primary Care Mirza Fessel: [name] Other Clinician: Referring Mechel Schutter: Treating Daphne Karrer/Extender:Stone III, [name], FRED Weeks in Treatment: 8 Encounter Discharge Information Items Discharge Condition: Stable Ambulatory Status: Wheelchair Discharge Destination: Home Transportation: Private Auto Accompanied By: self Schedule Follow-up Appointment: Yes Clinical Summary of Care: Patient Declined Electronic Signature(s) Signed: 05/24/2019 2:52:36 PM By: Carlene Coria RN Entered By: Carlene Coria on 05/18/2019 16:44:00 -------------------------------------------------------------------------------- Lower Extremity Assessment Details Patient Name: Date of Service: [name], [name] 05/18/2019 1:00 PM Medical Record [id number] Patient Account Number: [id number] Date of Birth/Sex: Treating RN: 08-May-1951 (68 y.o. [name] Primary Care Mahki Spikes: [name] Other Clinician: Referring Landry Lookingbill: Treating Stephano Arrants/Extender:Stone III, [name], FRED Weeks in Treatment: 8 Edema Assessment Assessed: [Left: Yes] [Right: No] Edema: [Left: Yes] [Right: No] Calf Left: Right: Point of Measurement: 37 cm From Medial Instep 36 cm cm Ankle Left: Right: Point of Measurement: 10 cm From Medial Instep 28 cm  cm Electronic Signature(s) Signed: 05/18/2019 6:36:09 PM By: Deon Pilling Entered By: Deon Pilling on 05/18/2019 13:29:58 -------------------------------------------------------------------------------- [place] Details Patient Name: Date of Service: [name] Duke. 05/18/2019 1:00 PM Medical Record [id number] Patient Account Number: [id number] Date of Birth/Sex: Treating RN: December 30, 1950 (68 y.o. [name] Primary Care Sparkles Mcneely: [name] Other Clinician: Referring Bryanna Yim: Treating Lindon Kiel/Extender:Stone III, [name], FRED Weeks in Treatment: 18 Active Inactive Venous Leg Ulcer Nursing Diagnoses: Actual venous Insuffiency (use after diagnosis is confirmed) Knowledge deficit related to disease process and management Goals: Patient will maintain optimal edema control  Date Initiated: 08/12/2017 Target Resolution Date: 06/15/2019 Goal Status: Active Patient/caregiver will verbalize understanding of disease process and disease management Date Initiated: 08/12/2017 Date Inactivated: 04/21/2018 Target Resolution Date: 04/24/2018 Goal Status: Met Interventions: Assess peripheral edema status every visit. Compression as ordered Treatment Activities: Therapeutic compression applied : 08/12/2017 Notes: Wound/Skin Impairment Nursing Diagnoses: Impaired tissue integrity Knowledge deficit related to ulceration/compromised skin integrity Goals: Patient/caregiver will verbalize understanding of skin care regimen Date Initiated: 08/12/2017 Target Resolution Date: 06/15/2019 Goal Status: Active Ulcer/skin breakdown will have a volume reduction of 30% by week 4 Date Initiated: 08/05/2017 Date Inactivated: 09/30/2017 Target Resolution Date: 10/03/2017 Goal Status: Met Ulcer/skin breakdown will have a volume reduction of 50% by week 8 Date Initiated: 09/30/2017 Date Inactivated: 10/28/2017 Target Resolution Date: 10/28/2017 Goal Status:  Met Interventions: Assess patient/caregiver ability to perform ulcer/skin care regimen upon admission and as needed Assess ulceration(s) every visit Provide education on ulcer and skin care Screen for HBO Treatment Activities: Patient referred to home care : 08/05/2017 Skin care regimen initiated : 08/05/2017 Topical wound management initiated : 08/05/2017 Notes: Electronic Signature(s) Signed: 05/18/2019 6:22:02 PM By: [name] RN, BSN Entered By: [name] on 05/18/2019 13:40:07 -------------------------------------------------------------------------------- Pain Assessment Details Patient Name: Date of Service: [name] Duke. 05/18/2019 1:00 PM Medical Record [id number] Patient Account Number: [id number] Date of Birth/Sex: Treating RN: 04-Feb-1951 (68 y.o. [name] Primary Care Arthur Aydelotte: [name] Other Clinician: Referring Jeffrie Lofstrom: Treating Vander Kueker/Extender:Stone III, [name], FRED Weeks in Treatment: 20 Active Problems Location of Pain Severity and Description of Pain Patient Has Paino Yes Site Locations Pain Location: Generalized Pain, Pain in Ulcers Rate the pain. Current Pain Level: 7 Worst Pain Level: 10 Least Pain Level: 0 Tolerable Pain Level: 8 Character of Pain Describe the Pain: Aching, Heavy, Sharp Pain Management and Medication Current Pain Management: Medication: No Cold Application: No Rest: No Massage: No Activity: No T.E.N.S.: No Heat Application: No Leg drop or elevation: No Is the Current Pain Management Adequate: Adequate How does your wound impact your activities of daily livingo Sleep: No Bathing: No Appetite: No Relationship With Others: No Bladder Continence: No Emotions: No Bowel Continence: No Work: No Toileting: No Drive: No Dressing: No Hobbies: No Electronic Signature(s) Signed: 05/18/2019 6:36:09 PM By: Deon Pilling Entered By: Deon Pilling on 05/18/2019  13:24:20 -------------------------------------------------------------------------------- Patient/Caregiver Education Details Patient Name: Date of Service: [name], [name] 12/2/2020andnbsp1:00 PM Medical Record Patient Account Number: [id number] Number: Treating RN: [name] Date of Birth/Gender: [birth year] (68 y.o. F) Other Clinician: Primary Care Physician: Tamala Julian, FRED Treating [name] Referring Physician: Physician/Extender: Wilhelmenia Blase in Treatment: 15 Education Assessment Education Provided To: Patient Education Topics Provided Venous: Methods: Explain/Verbal Responses: Reinforcements needed, State content correctly Wound/Skin Impairment: Methods: Explain/Verbal Responses: Reinforcements needed, State content correctly Electronic Signature(s) Signed: 05/18/2019 6:22:02 PM By: [name] RN, BSN Entered By: [name] on 05/18/2019 13:40:29 -------------------------------------------------------------------------------- Wound Assessment Details Patient Name: Date of Service: [name] Duke. 05/18/2019 1:00 PM Medical Record [id number] Patient Account Number: [id number] Date of Birth/Sex: Treating RN: [birth year] (68 y.o. [name] Primary Care Dawnette Mione: [name] Other Clinician: Referring Leilynn Pilat: Treating Jenette Rayson/Extender:Stone III, [name], FRED Weeks in Treatment: 60 Wound Status Wound Number: 57 Primary Diabetic Wound/Ulcer of the Lower Extremity Etiology: Wound Location: Left Foot - Dorsal Wound Healed - Epithelialized Wounding Event: Gradually Appeared Status: Date Acquired: 02/04/2019 Comorbid Asthma, Hypertension, Peripheral Arterial Weeks Of Treatment: 14 History: Disease, Peripheral Venous Disease, Type II Clustered Wound: No Diabetes, Gout, Osteoarthritis  Photos Wound Measurements Length: (cm) 0 % Reduction Width: (cm) 0 % Reduction Depth: (cm) 0 Epitheliali Area: (cm) 0 Volume:  (cm) 0 Wound Description Classification: Grade 2 Wound Margin: Distinct, outline attached Exudate Amount: Small Exudate Type: Serosanguineous Exudate Color: red, brown Wound Bed Granulation Amount: Medium (34-66%) Granulation Quality: Pink, Pale Necrotic Amount: Medium (34-66%) Necrotic Quality: Adherent Slough Foul Odor After Cleansing: No Slough/Fibrino Yes Exposed Structure Fascia Exposed: No Fat Layer (Subcutaneous Tissue) Exposed: Yes Tendon Exposed: No Muscle Exposed: No Joint Exposed: No Bone Exposed: No in Area: 100% in Volume: 100% zation: Small (1-33%) Electronic Signature(s) Signed: 05/20/2019 3:46:47 PM By: Mikeal Hawthorne EMT/HBOT Signed: 05/20/2019 5:26:50 PM By: Deon Pilling Previous Signature: 05/18/2019 6:36:09 PM Version By: Deon Pilling Entered By: Mikeal Hawthorne on 05/20/2019 14:14:43 -------------------------------------------------------------------------------- Wound Assessment Details Patient Name: Date of Service: [name] Duke. 05/18/2019 1:00 PM Medical Record [id number] Patient Account Number: [id number] Date of Birth/Sex: Treating RN: Oct 20, 1950 (68 y.o. [name] Primary Care Mycala Warshawsky: [name] Other Clinician: Referring Deaundre Allston: Treating Rian Busche/Extender:Stone III, [name], FRED Weeks in Treatment: 13 Wound Status Wound Number: 58 Primary Diabetic Wound/Ulcer of the Lower Extremity Etiology: Wound Location: Left Ankle - Medial Wound Open Wounding Event: Blister Status: Date Acquired: 04/04/2019 Comorbid Asthma, Hypertension, Peripheral Arterial Weeks Of Treatment: 6 History: Disease, Peripheral Venous Disease, Type II Clustered Wound: No Diabetes, Gout, Osteoarthritis Photos Wound Measurements Length: (cm) 0.2 Width: (cm) 4 Depth: (cm) 0.1 Area: (cm) 0.628 Volume: (cm) 0.063 Wound Description Classification: Grade 2 Wound Margin: Distinct, outline attached Exudate Amount: Small Exudate Type:  Serosanguineous Exudate Color: red, brown Wound Bed Granulation Amount: Large (67-100%) Granulation Quality: Pink Necrotic Amount: Small (1-33%) Necrotic Quality: Adherent Slough After Cleansing: No brino Yes Exposed Structure osed: No (Subcutaneous Tissue) Exposed: Yes osed: No osed: No sed: No ed: No % Reduction in Area: 88.9% % Reduction in Volume: 88.8% Epithelialization: Large (67-100%) Tunneling: No Undermining: No Foul Odor Slough/Fi Fascia Exp Fat Layer Tendon Exp Muscle Exp Joint Expo Bone Expos Treatment Notes Wound #58 (Left, Medial Ankle) 1. Cleanse With Wound Cleanser Soap and water 3. Primary Dressing Applied Calcium Alginate Ag 4. Secondary Dressing ABD Pad Dry Gauze 6. Support Layer Applied 3 layer compression wrap Notes netting Electronic Signature(s) Signed: 05/20/2019 3:46:47 PM By: Mikeal Hawthorne EMT/HBOT Signed: 05/20/2019 5:26:50 PM By: Deon Pilling Previous Signature: 05/18/2019 6:36:09 PM Version By: Deon Pilling Entered By: Mikeal Hawthorne on 05/20/2019 14:18:17 -------------------------------------------------------------------------------- Wound Assessment Details Patient Name: Date of Service: [name] Duke. 05/18/2019 1:00 PM Medical Record [id number] Patient Account Number: [id number] Date of Birth/Sex: Treating RN: 05/05/1951 (68 y.o. [name] Primary Care Lakashia Collison: [name] Other Clinician: Referring Jaidyn Kuhl: Treating Messiah Ahr/Extender:Stone III, [name], FRED Weeks in Treatment: 35 Wound Status Wound Number: 59 Primary Diabetic Wound/Ulcer of the Lower Etiology: Extremity Wound Location: Left Lower Leg - Medial Secondary Lymphedema Wounding Event: Gradually Appeared Etiology: Date Acquired: 04/20/2019 Wound Healed - Epithelialized Weeks Of Treatment: 4 Status: Clustered Wound: No Comorbid Asthma, Hypertension, Peripheral Arterial History: Disease, Peripheral Venous Disease, Type  II Diabetes, Gout, Osteoarthritis Photos Wound Measurements Length: (cm) 0 % Reducti Width: (cm) 0 % Reducti Depth: (cm) 0 Epithelia Area: (cm) 0 Volume: (cm) 0 Wound Description Classification: Grade 1 Wound Margin: Distinct, outline attached Exudate Amount: Medium Exudate Type: Serosanguineous Exudate Color: red, brown Wound Bed Granulation Amount: Large (67-100%) Granulation Quality: Red Necrotic Amount: None Present (0%) Foul Odor After Cleansing: No Slough/Fibrino No Exposed Structure Fascia Exposed: No Fat Layer (Subcutaneous Tissue)  Exposed: No Tendon Exposed: No Muscle Exposed: No Joint Exposed: No Bone Exposed: No on in Area: 100% on in Volume: 100% lization: Small (1-33%) Electronic Signature(s) Signed: 05/20/2019 3:46:47 PM By: Mikeal Hawthorne EMT/HBOT Signed: 05/20/2019 5:26:50 PM By: Deon Pilling Previous Signature: 05/18/2019 6:36:09 PM Version By: Deon Pilling Entered By: Mikeal Hawthorne on 05/20/2019 14:17:49 -------------------------------------------------------------------------------- Wound Assessment Details Patient Name: Date of Service: [name] Duke. 05/18/2019 1:00 PM Medical Record [id number] Patient Account Number: [id number] Date of Birth/Sex: Treating RN: 1951-02-15 (68 y.o. [name] Primary Care Franco Duley: [name] Other Clinician: Referring Darivs Lunden: Treating Sherley Mckenney/Extender:Stone III, [name], FRED Weeks in Treatment: 43 Wound Status Wound Number: 61 Primary Diabetic Wound/Ulcer of the Lower Etiology: Extremity Wound Location: Left Lower Leg - Posterior Secondary Lymphedema Wounding Event: Gradually Appeared Etiology: Date Acquired: 04/20/2019 Wound Healed - Epithelialized Weeks Of Treatment: 4 Status: Clustered Wound: No Comorbid Asthma, Hypertension, Peripheral Arterial History: Disease, Peripheral Venous Disease, Type II Diabetes, Gout, Osteoarthritis Photos Wound Measurements Length: (cm) 0 %  Reducti Width: (cm) 0 % Reducti Depth: (cm) 0 Epithelia Area: (cm) 0 Volume: (cm) 0 Wound Description Classification: Grade 1 Wound Margin: Distinct, outline attached Exudate Amount: Medium Exudate Type: Serosanguineous Exudate Color: red, brown Wound Bed Granulation Amount: Large (67-100%) Granulation Quality: Red, Pink Necrotic Amount: Small (1-33%) Necrotic Quality: Adherent Slough Foul Odor After Cleansing: No Slough/Fibrino Yes Exposed Structure Fascia Exposed: No Fat Layer (Subcutaneous Tissue) Exposed: Yes Tendon Exposed: No Muscle Exposed: No Joint Exposed: No Bone Exposed: No on in Area: 100% on in Volume: 100% lization: Small (1-33%) Electronic Signature(s) Signed: 05/20/2019 3:46:47 PM By: Mikeal Hawthorne EMT/HBOT Signed: 05/20/2019 5:26:50 PM By: Deon Pilling Previous Signature: 05/18/2019 6:36:09 PM Version By: Deon Pilling Entered By: Mikeal Hawthorne on 05/20/2019 14:20:02 -------------------------------------------------------------------------------- Wound Assessment Details Patient Name: Date of Service: [name] Duke. 05/18/2019 1:00 PM Medical Record [id number] Patient Account Number: [id number] Date of Birth/Sex: Treating RN: 1950-09-01 (68 y.o. [name] Primary Care Kito Cuffe: [name] Other Clinician: Referring Rosalio Catterton: Treating Jettie Lazare/Extender:Stone III, [name], FRED Weeks in Treatment: 47 Wound Status Wound Number: 61 Primary Venous Leg Ulcer Etiology: Wound Location: Left Lower Leg - Anterior Wound Open Wounding Event: Gradually Appeared Status: Date Acquired: 04/20/2019 Comorbid Asthma, Hypertension, Peripheral Arterial Weeks Of Treatment: 4 History: Disease, Peripheral Venous Disease, Type II Clustered Wound: No Diabetes, Gout, Osteoarthritis Photos Wound Measurements Length: (cm) 1 % Reduct Width: (cm) 0.5 % Reduct Depth: (cm) 0.1 Epitheli Area: (cm) 0.393 Tunneli Volume: (cm) 0.039 Undermi Wound  Description Classification: Full Thickness Without Exposed Support Foul Od Structures Slough/ Wound Flat and Intact Margin: Exudate Small Amount: Exudate Serosanguineous Type: Exudate red, brown Color: Wound Bed Granulation Amount: Large (67-100%) Granulation Quality: Pink Fascia E Necrotic Amount: None Present (0%) Fat Laye Tendon E Muscle E Joint Exp Bone Expo or After Cleansing: No Fibrino No Exposed Structure xposed: No r (Subcutaneous Tissue) Exposed: Yes xposed: No xposed: No osed: No sed: No ion in Area: 94.8% ion in Volume: 94.8% alization: None ng: No ning: No Treatment Notes Wound #61 (Left, Anterior Lower Leg) 1. Cleanse With Wound Cleanser Soap and water 3. Primary Dressing Applied Calcium Alginate Ag 4. Secondary Dressing ABD Pad Dry Gauze 6. Support Layer Applied 3 layer compression wrap Notes netting Electronic Signature(s) Signed: 05/20/2019 3:46:47 PM By: Mikeal Hawthorne EMT/HBOT Signed: 05/20/2019 5:26:50 PM By: Deon Pilling Previous Signature: 05/18/2019 6:36:09 PM Version By: Deon Pilling Entered By: Mikeal Hawthorne on 05/20/2019 14:16:20 -------------------------------------------------------------------------------- Wound Assessment Details Patient Name: Date  of Service: [name], [name] 05/18/2019 1:00 PM Medical Record [id number] Patient Account Number: [id number] Date of Birth/Sex: Treating RN: 1950/08/31 (68 y.o. [name] Primary Care Keshanna Riso: [name] Other Clinician: Referring Celestina Gironda: Treating Cleave Ternes/Extender:Stone III, [name], FRED Weeks in Treatment: 40 Wound Status Wound Number: 62 Primary Diabetic Wound/Ulcer of the Lower Etiology: Extremity Wound Location: Left Lower Leg - Medial, Proximal Secondary Lymphedema Wounding Event: Gradually Appeared Etiology: Date Acquired: 05/12/2019 Wound Open Weeks Of Treatment: 0 Status: Clustered Wound: No Comorbid Asthma, Hypertension, Peripheral  Arterial History: Disease, Peripheral Venous Disease, Type II Diabetes, Gout, Osteoarthritis Photos Wound Measurements Length: (cm) 0.9 % Reductio Width: (cm) 0.8 % Reductio Depth: (cm) 0.1 Epithelial Area: (cm) 0.565 Tunneling Volume: (cm) 0.057 Undermini Wound Description Classification: Grade 2 Wound Margin: Distinct, outline attached Exudate Amount: Medium Exudate Type: Serosanguineous Exudate Color: red, brown Wound Bed Granulation Amount: Large (67-100%) Granulation Quality: Red, Pink Necrotic Amount: None Present (0%) Foul Odor After Cleansing: No Slough/Fibrino Yes Exposed Structure Fascia Exposed: No Fat Layer (Subcutaneous Tissue) Exposed: No Tendon Exposed: No Muscle Exposed: No Joint Exposed: No Bone Exposed: No n in Area: 0% n in Volume: 0% ization: Small (1-33%) : No ng: No Treatment Notes Wound #62 (Left, Proximal, Medial Lower Leg) 1. Cleanse With Wound Cleanser Soap and water 3. Primary Dressing Applied Calcium Alginate Ag 4. Secondary Dressing ABD Pad Dry Gauze 6. Support Layer Applied 3 layer compression wrap Notes netting Electronic Signature(s) Signed: 05/20/2019 3:46:47 PM By: Mikeal Hawthorne EMT/HBOT Signed: 05/20/2019 5:26:50 PM By: Deon Pilling Previous Signature: 05/18/2019 6:36:09 PM Version By: Deon Pilling Entered By: Mikeal Hawthorne on 05/20/2019 14:16:47 -------------------------------------------------------------------------------- Vitals Details Patient Name: Date of Service: [name] Duke. 05/18/2019 1:00 PM Medical Record [id number] Patient Account Number: [id number] Date of Birth/Sex: Treating RN: [birth year] (68 y.o. [name] Primary Care Irven Ingalsbe: [name] Other Clinician: Referring Paidyn Mcferran: Treating Gevon Markus/Extender:Stone III, [name], FRED Weeks in Treatment: 16 Vital Signs Time Taken: 13:23 Temperature (F): 98.5 Height (in): 62 Pulse (bpm): 106 Weight (lbs): 335 Respiratory Rate  (breaths/min): 20 Body Mass Index (BMI): 61.3 Blood Pressure (mmHg): 158/73 Capillary Blood Glucose (mg/dl): 144 Reference Range: 80 - 120 mg / dl Electronic Signature(s) Signed: 05/18/2019 6:36:09 PM By: Deon Pilling Entered By: Deon Pilling on 05/18/2019 13:25:48

## 2019-05-24 NOTE — Progress Notes (Signed)
[name], [name] ([id number]) Visit Report for 04/06/2019 Arrival Information Details Patient Name: Date of Service: [name], [name] 04/06/2019 10:00 AM Medical Record [id number] Patient Account Number: [id number] Date of Birth/Sex: Treating RN: [birth year] (68 y.o. [name] Primary Care Tyteanna Ost: [name] Other Clinician: Referring Manley Fason: Treating Haedyn Breau/Extender:Stone III, [name], FRED Weeks in Treatment: 65 Visit Information History Since Last Visit All ordered tests and consults were completed: No Patient Arrived: Wheel Chair Added or deleted any medications: No Arrival Time: 09:42 Any new allergies or adverse reactions: No Accompanied By: self Had a fall or experienced change in No activities of daily living that may affect Transfer Assistance: None risk of falls: Patient Identification Verified: Yes Signs or symptoms of abuse/neglect since last No Secondary Verification Process Yes visito Completed: Hospitalized since last visit: No Patient Requires Transmission-Based No Implantable device outside of the clinic excluding No Precautions: cellular tissue based products placed in the center Patient Has Alerts: Yes since last visit: Patient Alerts: R ABI= Has Dressing in Place as Prescribed: Yes 1.01 Has Compression in Place as Prescribed: Yes L ABI = .99 Pain Present Now: No Electronic Signature(s) Signed: 05/24/2019 3:01:15 PM By: Carlene Coria RN Entered By: Carlene Coria on 04/06/2019 09:43:19 -------------------------------------------------------------------------------- Compression Therapy Details Patient Name: Date of Service: [name]. 04/06/2019 10:00 AM Medical Record [id number] Patient Account Number: [id number] Date of Birth/Sex: Treating RN: [birth year] (68 y.o. [name] Primary Care Deshay Kirstein: [name] Other Clinician: Referring Damonie Furney: Treating Davinci Glotfelty/Extender:Stone III, [name],  FRED Weeks in Treatment: 69 Compression Therapy Performed for Wound Wound #57 Left,Dorsal Foot Assessment: Performed By: Clinician Levan Hurst, RN Compression Type: Three Layer Post Procedure Diagnosis Same as Pre-procedure Electronic Signature(s) Signed: 04/06/2019 6:50:57 PM By: Levan Hurst RN, BSN Entered By: Levan Hurst on 04/06/2019 10:57:10 -------------------------------------------------------------------------------- Compression Therapy Details Patient Name: Date of Service: [name]. 04/06/2019 10:00 AM Medical Record [id number] Patient Account Number: [id number] Date of Birth/Sex: Treating RN: 21-Aug-1950 (67 y.o. [name] Primary Care Camile Esters: [name] Other Clinician: Referring Bobie Kistler: Treating Georgeanne Frankland/Extender:Stone III, [name], FRED Weeks in Treatment: 33 Compression Therapy Performed for Wound Wound #58 Left,Medial Ankle Assessment: Performed By: Clinician Levan Hurst, RN Compression Type: Three Layer Post Procedure Diagnosis Same as Pre-procedure Electronic Signature(s) Signed: 04/06/2019 6:50:57 PM By: Levan Hurst RN, BSN Entered By: Levan Hurst on 04/06/2019 10:57:10 -------------------------------------------------------------------------------- Encounter Discharge Information Details Patient Name: Date of Service: [name]. 04/06/2019 10:00 AM Medical Record [id number] Patient Account Number: [id number] Date of Birth/Sex: Treating RN: 10/19/1950 (67 y.o. [name] Primary Care Glenetta Kiger: [name] Other Clinician: Referring Faline Langer: Treating Maily Debarge/Extender:Stone III, [name], FRED Weeks in Treatment: 23 Encounter Discharge Information Items Discharge Condition: Stable Ambulatory Status: Wheelchair Discharge Destination: Home Transportation: Private Auto Accompanied By: self Schedule Follow-up Appointment: Yes Clinical Summary of Care: Electronic  Signature(s) Signed: 04/06/2019 6:26:11 PM By: Deon Pilling Entered By: Deon Pilling on 04/06/2019 11:13:28 -------------------------------------------------------------------------------- Lower Extremity Assessment Details Patient Name: Date of Service: [name], [name] 04/06/2019 10:00 AM Medical Record [id number] Patient Account Number: [id number] Date of Birth/Sex: Treating RN: 03-14-1951 (67 y.o. [name] Primary Care Latrish Mogel: [name] Other Clinician: Referring Lucilla Petrenko: Treating Arrie Zuercher/Extender:Stone III, [name], FRED Weeks in Treatment: 17 Edema Assessment Assessed: [Left: No] [Right: No] Edema: [Left: Yes] [Right: Yes] Calf Left: Right: Point of Measurement: 37 cm From Medial Instep 39 cm cm Ankle Left: Right: Point of Measurement: 10 cm From Medial Instep 28 cm cm Electronic Signature(s) Signed: 05/24/2019 3:01:15  PM By: Carlene Coria RN Entered By: Carlene Coria on 04/06/2019 09:45:30 -------------------------------------------------------------------------------- [place] Details Patient Name: Date of Service: [name], [name]. 04/06/2019 10:00 AM Medical Record [id number] Patient Account Number: [id number] Date of Birth/Sex: Treating RN: [birth year] (68 y.o. 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 Compression as ordered Treatment Activities: Therapeutic compression applied : 08/12/2017 Notes: Wound/Skin Impairment Nursing Diagnoses: Impaired tissue integrity Knowledge deficit related to ulceration/compromised skin integrity Goals: Patient/caregiver will verbalize understanding of skin care regimen Date Initiated: 08/12/2017 Target Resolution Date: 05/06/2019 Goal Status: Active Ulcer/skin breakdown will have a volume reduction of 30% by week 4 Date Initiated: 08/05/2017 Date Inactivated: 09/30/2017 Target Resolution Date: 10/03/2017 Goal Status: Met Ulcer/skin breakdown will have a volume reduction of 50% by week 8 Date Initiated: 09/30/2017 Date Inactivated: 10/28/2017 Target Resolution Date: 10/28/2017 Goal Status: Met Interventions: Assess patient/caregiver ability to perform ulcer/skin care regimen upon admission and as needed Assess ulceration(s) every visit Provide education on ulcer and skin care Screen for HBO Treatment Activities: Patient referred to home care : 08/05/2017 Skin care regimen initiated : 08/05/2017 Topical wound management initiated : 08/05/2017 Notes: Electronic Signature(s) Signed: 04/06/2019 6:50:57 PM By: Levan Hurst RN, BSN Entered By: Levan Hurst on 04/06/2019 10:51:33 -------------------------------------------------------------------------------- Pain Assessment Details Patient Name: Date of Service: [name]. 04/06/2019 10:00 AM Medical Record [id number] Patient Account Number: [id number] Date of Birth/Sex: Treating RN: 04-Apr-1951 (68 y.o. [name] Primary Care Ryleeann Urquiza: [name] Other Clinician: Referring Elzy Tomasello: Treating Almalik Weissberg/Extender:Stone III, [name], FRED Weeks in Treatment: 72 Active Problems Location of Pain Severity and Description of Pain Patient Has Paino No Site Locations Pain Management and Medication Current  Pain Management: Electronic Signature(s) Signed: 05/24/2019 3:01:15 PM By: Carlene Coria RN Entered By: Carlene Coria on 04/06/2019 09:44:15 -------------------------------------------------------------------------------- Patient/Caregiver Education Details Patient Name: [name] 10/21/2020andnbsp10:00 Date of Service: AM Medical Record [id number] Number: Patient Account Number: [id number] Treating RN: Oct 29, 1950 (67 y.o. Levan Hurst Date of Birth/Gender: F) Other Clinician: Primary Care Treating SMITH, FRED [name] Physician: Physician/Extender: Referring Physician: Wilhelmenia Blase in Treatment: 19 Education Assessment Education Provided To: Patient Education Topics Provided Wound/Skin Impairment: Methods: Explain/Verbal Responses: State content correctly Electronic Signature(s) Signed: 04/06/2019 6:50:57 PM By: Levan Hurst RN, BSN Entered By: Levan Hurst on 04/06/2019 10:51:49 -------------------------------------------------------------------------------- Wound Assessment Details Patient Name: Date of Service: [name]. 04/06/2019 10:00 AM Medical Record [id number] Patient Account Number: [id number] Date of Birth/Sex: Treating RN: April 27, 1951 (67 y.o. [name] Primary Care Jeyson Deshotel: [name] Other Clinician: Referring Zamiah Tollett: Treating Marlaine Arey/Extender:Stone III, [name], FRED Weeks in Treatment: 54 Wound Status Wound Number: 57 Primary Diabetic Wound/Ulcer of the Lower Extremity Etiology: Wound Location: Left Foot - Dorsal Wound Open Wounding Event: Gradually Appeared Status: Date Acquired: 02/04/2019 Comorbid Asthma, Hypertension, Peripheral Arterial Weeks Of Treatment: 8 History: Disease, Peripheral Venous Disease, Type II Clustered Wound: No Diabetes, Gout, Osteoarthritis Photos Wound Measurements Length: (cm) 3 Width: (cm) 3.2 Depth: (cm) 0.1 Area: (cm) 7.54 Volume: (cm) 0.754  Wound  Description Classification: Grade 2 Exudate Amount: Small Exudate Type: Serosanguineous Exudate Color: red, brown Wound Bed Granulation Amount: Medium (34-66%) Granulation Quality: Pink, Pale Necrotic Amount: Medium (34-66%) Necrotic Quality: Adherent Slough After Cleansing: No rino Yes Exposed Structure sed: No Subcutaneous Tissue) Exposed: Yes sed: No sed: No ed: No d: No % Reduction in Area: -2301.3% % Reduction in Volume: -2332.3% Epithelialization: None Tunneling: No Undermining: No Foul Odor Slough/Fib Fascia Expo Fat Layer ( Tendon Expo Muscle Expo Joint Expos Bone Expose Electronic Signature(s) Signed: 04/11/2019 4:53:58 PM By: Mikeal Hawthorne EMT/HBOT Signed: 05/24/2019 3:01:15 PM By: Carlene Coria RN Entered By: Mikeal Hawthorne on 04/11/2019 11:07:41 -------------------------------------------------------------------------------- Wound Assessment Details Patient Name: Date of Service: [name]. 04/06/2019 10:00 AM Medical Record [id number] Patient Account Number: [id number] Date of Birth/Sex: Treating RN: Feb 24, 1951 (68 y.o. [name] Primary Care Olusegun Gerstenberger: [name] Other Clinician: Referring Berlyn Malina: Treating Britani Beattie/Extender:Stone III, [name], FRED Weeks in Treatment: 33 Wound Status Wound Number: 58 Primary Diabetic Wound/Ulcer of the Lower Extremity Etiology: Wound Location: Left Ankle - Medial Wound Open Wounding Event: Blister Status: Date Acquired: 04/04/2019 Comorbid Asthma, Hypertension, Peripheral Arterial Weeks Of Treatment: 0 History: Disease, Peripheral Venous Disease, Type II Clustered Wound: No Diabetes, Gout, Osteoarthritis Photos Wound Measurements Length: (cm) 3.6 Width: (cm) 2 Depth: (cm) 0.1 Area: (cm) 5.655 Volume: (cm) 0.565 Wound Description Classification: Grade 2 Exudate Amount: Small Exudate Type: Serosanguineous Exudate Color: red, brown Wound Bed Granulation Amount: Medium  (34-66%) Granulation Quality: Pink Necrotic Amount: Medium (34-66%) Necrotic Quality: Adherent Slough After Cleansing: No rino Yes Exposed Structure sed: No Subcutaneous Tissue) Exposed: Yes sed: No sed: No ed: No d: No % Reduction in Area: 0% % Reduction in Volume: 0% Epithelialization: None Tunneling: No Undermining: No Foul Odor Slough/Fib Fascia Expo Fat Layer ( Tendon Expo Muscle Expo Joint Expos Bone Expose Electronic Signature(s) Signed: 04/11/2019 4:53:58 PM By: Mikeal Hawthorne EMT/HBOT Signed: 05/24/2019 3:01:15 PM By: Carlene Coria RN Entered By: Mikeal Hawthorne on 04/11/2019 11:13:31 -------------------------------------------------------------------------------- Vitals Details Patient Name: Date of Service: [name]. 04/06/2019 10:00 AM Medical Record [id number] Patient Account Number: [id number] Date of Birth/Sex: Treating RN: 02-08-1951 (68 y.o. [name] Primary Care Rye Decoste: [name] Other Clinician: Referring Kiren Mcisaac: Treating Valita Righter/Extender:Stone III, [name], FRED Weeks in Treatment: 37 Vital Signs Time Taken: 09:43 Temperature (F): 98.4 Height (in): 62 Pulse (bpm): 92 Weight (lbs): 335 Respiratory Rate (breaths/min): 20 Body Mass Index (BMI): 61.3 Blood Pressure (mmHg): 158/86 Reference Range: 80 - 120 mg / dl Electronic Signature(s) Signed: 05/24/2019 3:01:15 PM By: Carlene Coria RN Entered By: Carlene Coria on 04/06/2019 09:43:56

## 2019-05-24 NOTE — Progress Notes (Signed)
[name], [name] ([id number]) Visit Report for 04/27/2019 Arrival Information Details Patient Name: Date of Service: [name], [name] 04/27/2019 9:30 AM Medical Record [id number] Patient Account Number: [id number] Date of Birth/Sex: Treating RN: 10/08/1950 (68 y.o. [name] Primary Care Sedalia Greeson: [name] Other Clinician: Referring Kanita Delage: Treating Jacey Eckerson/Extender:Stone III, [name], FRED Weeks in Treatment: 77 Visit Information History Since Last Visit All ordered tests and consults were completed: No Patient Arrived: Wheel Chair Added or deleted any medications: No Arrival Time: 09:48 Any new allergies or adverse reactions: No Accompanied By: self Had a fall or experienced change in No activities of daily living that may affect Transfer Assistance: None risk of falls: Patient Identification Verified: Yes Signs or symptoms of abuse/neglect since last No Secondary Verification Process Yes visito Completed: Hospitalized since last visit: No Patient Requires Transmission-Based No Implantable device outside of the clinic excluding No Precautions: cellular tissue based products placed in the center Patient Has Alerts: Yes since last visit: Patient Alerts: R ABI= Has Dressing in Place as Prescribed: Yes 1.01 Has Compression in Place as Prescribed: Yes L ABI = .99 Pain Present Now: No Electronic Signature(s) Signed: 05/24/2019 2:53:59 PM By: Carlene Coria RN Entered By: Carlene Coria on 04/27/2019 09:49:54 -------------------------------------------------------------------------------- Compression Therapy Details Patient Name: Date of Service: [name]. 04/27/2019 9:30 AM Medical Record [id number] Patient Account Number: [id number] Date of Birth/Sex: Treating RN: [birth year] (68 y.o. [name] Primary Care Amauria Younts: [name] Other Clinician: Referring Ohanna Gassert: Treating Denali Sharma/Extender:Stone III, [name], FRED Weeks  in Treatment: 90 Compression Therapy Performed for Wound Wound #59 Left,Medial Lower Leg Assessment: Performed By: Clinician Deon Pilling, RN Compression Type: Three Layer Post Procedure Diagnosis Same as Pre-procedure Electronic Signature(s) Signed: 04/27/2019 5:49:26 PM By: [name] RN, BSN Entered By: [name] on 04/27/2019 10:40:18 -------------------------------------------------------------------------------- Encounter Discharge Information Details Patient Name: Date of Service: [name]. 04/27/2019 9:30 AM Medical Record [id number] Patient Account Number: [id number] Date of Birth/Sex: Treating RN: [birth year] (67 y.o. [name] Primary Care Semaja Lymon: [name] Other Clinician: Referring Tisha Cline: Treating Kaylib Furness/Extender:Stone III, [name], FRED Weeks in Treatment: 73 Encounter Discharge Information Items Discharge Condition: Stable Ambulatory Status: Wheelchair Discharge Destination: Home Transportation: Private Auto Accompanied By: self Schedule Follow-up Appointment: Yes Clinical Summary of Care: Electronic Signature(s) Signed: 04/27/2019 5:42:00 PM By: Deon Pilling Entered By: Deon Pilling on 04/27/2019 10:57:54 -------------------------------------------------------------------------------- Lower Extremity Assessment Details Patient Name: Date of Service: [name], [name] 04/27/2019 9:30 AM Medical Record [id number] Patient Account Number: [id number] Date of Birth/Sex: Treating RN: 1950-07-01 (67 y.o. [name] Primary Care Graceson Nichelson: [name] Other Clinician: Referring Myshawn Chiriboga: Treating Suhayla Chisom/Extender:Stone III, [name], FRED Weeks in Treatment: 90 Edema Assessment Assessed: [Left: No] [Right: No] Edema: [Left: Yes] [Right: No] Calf Left: Right: Point of Measurement: 37 cm From Medial Instep 39 cm cm Ankle Left: Right: Point of Measurement: 10 cm From Medial Instep 28 cm cm Electronic  Signature(s) Signed: 05/24/2019 2:53:59 PM By: Carlene Coria RN Entered By: Carlene Coria on 04/27/2019 09:52:00 -------------------------------------------------------------------------------- Multi-Disciplinary Care Plan Details Patient Name: Date of Service: [name]. 04/27/2019 9:30 AM Medical Record [id number] Patient Account Number: [id number] Date of Birth/Sex: Treating RN: Oct 22, 1950 (67 y.o. [name] Primary Care Janett Kamath: [name] Other Clinician: Referring Jaedin Trumbo: Treating Cedric Mcclaine/Extender:Stone III, [name], FRED Weeks in Treatment: 34 Active Inactive Venous Leg Ulcer Nursing Diagnoses: Actual venous Insuffiency (use after diagnosis is confirmed) Knowledge deficit related to disease process and management Goals: Patient will maintain optimal edema control  Date Initiated: 08/12/2017 Target Resolution Date: 05/06/2019 Goal Status: Active Patient/caregiver will verbalize understanding of disease process and disease management Date Initiated: 08/12/2017 Date Inactivated: 04/21/2018 Target Resolution Date: 04/24/2018 Goal Status: Met Interventions: Assess peripheral edema status every visit. Compression as ordered Treatment Activities: Therapeutic compression applied : 08/12/2017 Notes: Wound/Skin Impairment Nursing Diagnoses: Impaired tissue integrity Knowledge deficit related to ulceration/compromised skin integrity Goals: Patient/caregiver will verbalize understanding of skin care regimen Date Initiated: 08/12/2017 Target Resolution Date: 05/06/2019 Goal Status: Active Ulcer/skin breakdown will have a volume reduction of 30% by week 4 Date Initiated: 08/05/2017 Date Inactivated: 09/30/2017 Target Resolution Date: 10/03/2017 Goal Status: Met Ulcer/skin breakdown will have a volume reduction of 50% by week 8 Date Initiated: 09/30/2017 Date Inactivated: 10/28/2017 Target Resolution Date: 10/28/2017 Goal Status: Met Interventions: Assess  patient/caregiver ability to perform ulcer/skin care regimen upon admission and as needed Assess ulceration(s) every visit Provide education on ulcer and skin care Screen for HBO Treatment Activities: Patient referred to home care : 08/05/2017 Skin care regimen initiated : 08/05/2017 Topical wound management initiated : 08/05/2017 Notes: Electronic Signature(s) Signed: 04/27/2019 5:49:26 PM By: [name] RN, BSN Entered By: [name] on 04/27/2019 10:39:28 -------------------------------------------------------------------------------- Pain Assessment Details Patient Name: Date of Service: [name]. 04/27/2019 9:30 AM Medical Record [id number] Patient Account Number: [id number] Date of Birth/Sex: Treating RN: July 06, 1950 (68 y.o. [name] Primary Care Azaiah Licciardi: [name] Other Clinician: Referring Kassim Guertin: Treating Keats Kingry/Extender:Stone III, [name], FRED Weeks in Treatment: 68 Active Problems Location of Pain Severity and Description of Pain Patient Has Paino No Site Locations Pain Management and Medication Current Pain Management: Electronic Signature(s) Signed: 05/24/2019 2:53:59 PM By: Carlene Coria RN Entered By: Carlene Coria on 04/27/2019 09:50:55 -------------------------------------------------------------------------------- Patient/Caregiver Education Details Patient Name: [name] 11/11/2020andnbsp9:30 Date of Service: AM Medical Record [id number] Number: Patient Account Number: [id number] Treating RN: 08/24/1950 (67 y.o. [name] Date of Birth/Gender: F) Other Clinician: Primary Care Physician:SMITH, FRED Treating [name] Referring Physician: Physician/Extender: Wilhelmenia Blase in Treatment: 26 Education Assessment Education Provided To: Patient Education Topics Provided Venous: Methods: Explain/Verbal Responses: Reinforcements needed, State content correctly Wound/Skin  Impairment: Methods: Explain/Verbal Responses: Reinforcements needed, State content correctly Electronic Signature(s) Signed: 04/27/2019 5:49:26 PM By: [name] RN, BSN Entered By: [name] on 04/27/2019 10:39:52 -------------------------------------------------------------------------------- Wound Assessment Details Patient Name: Date of Service: [name]. 04/27/2019 9:30 AM Medical Record [id number] Patient Account Number: [id number] Date of Birth/Sex: Treating RN: 23-Jun-1950 (67 y.o. [name] Primary Care Bemnet Trovato: [name] Other Clinician: Referring Emerick Weatherly: Treating Klair Leising/Extender:Stone III, [name], FRED Weeks in Treatment: 90 Wound Status Wound Number: 57 Primary Diabetic Wound/Ulcer of the Lower Extremity Etiology: Wound Location: Left Foot - Dorsal Wound Open Wounding Event: Gradually Appeared Status: Date Acquired: 02/04/2019 Comorbid Asthma, Hypertension, Peripheral Arterial Weeks Of Treatment: 11 History: Disease, Peripheral Venous Disease, Type II Clustered Wound: No Diabetes, Gout, Osteoarthritis Photos Wound Measurements Length: (cm) 5 % Reduction i Width: (cm) 7.2 % Reduction i Depth: (cm) 0.1 Epithelializa Area: (cm) 28.274 Tunneling: Volume: (cm) 2.827 Undermining: Wound Description Classification: Grade 2 Foul Odor Af Wound Margin: Distinct, outline attached Slough/Fibri Exudate Amount: Small Exudate Type: Serosanguineous Exudate Color: red, brown Wound Bed Granulation Amount: Medium (34-66%) Granulation Quality: Pink, Pale Fascia Expose Necrotic Amount: Medium (34-66%) Fat Layer (Su Necrotic Quality: Adherent Slough Tendon Expose Muscle Expose Joint Exposed Bone Exposed: Electronic Signature(s) Signed: 04/29/2019 4:02:07 PM By: Mikeal Hawthorne EMT/HBOT Signed: 05/24/2019 2:53:59 PM By: Carlene Coria RN Entered By: Ronnald Ramp,  Dedrick on 11/13 ter Cleansing: No no Yes Exposed Structure d:  No bcutaneous Tissue) Exposed: Yes d: No d: No : No No /2020 11:02:50 n Area: -8904.5% n Volume: -9019.4% tion: Small (1-33%) No No -------------------------------------------------------------------------------- Wound Assessment Details Patient Name: Date of Service: ROSSIE, SCARFONE 04/27/2019 9:30 AM Medical Record [id number] Patient Account Number: [id number] Date of Birth/Sex: Treating RN: 11-06-1950 (68 y.o. [name] Primary Care Calel Pisarski: [name] Other Clinician: Referring Miski Feldpausch: Treating Magdelyn Roebuck/Extender:Stone III, [name], FRED Weeks in Treatment: 90 Wound Status Wound Number: 58 Primary Diabetic Wound/Ulcer of the Lower Extremity Etiology: Wound Location: Left Ankle - Medial Wound Open Wounding Event: Blister Status: Date Acquired: 04/04/2019 Comorbid Asthma, Hypertension, Peripheral Arterial Weeks Of Treatment: 3 History: Disease, Peripheral Venous Disease, Type II Clustered Wound: No Diabetes, Gout, Osteoarthritis Photos Wound Measurements Length: (cm) 1.9 Width: (cm) 2 Depth: (cm) 0.1 Area: (cm) 2.985 Volume: (cm) 0.298 Wound Description Classification: Grade 2 Wound Margin: Distinct, outline attached Exudate Amount: Small Exudate Type: Serosanguineous Exudate Color: red, brown Wound Bed Granulation Amount: Large (67-100%) Granulation Quality: Pink Necrotic Amount: Small (1-33%) Necrotic Quality: Adherent Slough After Cleansing: No brino Yes Exposed Structure osed: No (Subcutaneous Tissue) Exposed: Yes osed: No sed: No ed: No d: No % Reduction in Area: 47.2% % Reduction in Volume: 47.3% Epithelialization: None Tunneling: No Undermining: No Foul Odor Slough/Fi Fascia Exp Fat Layer Tendon Exp Muscle Expo Joint Expos Bone Expose Electronic Signature(s) Signed: 04/29/2019 4:02:07 PM By: Mikeal Hawthorne EMT/HBOT Signed: 05/24/2019 2:53:59 PM By: Carlene Coria RN Entered By: Mikeal Hawthorne on 04/29/2019  11:11:21 -------------------------------------------------------------------------------- Wound Assessment Details Patient Name: Date of Service: [name]. 04/27/2019 9:30 AM Medical Record [id number] Patient Account Number: [id number] Date of Birth/Sex: Treating RN: 07/05/1950 (67 y.o. [name] Primary Care Nusaiba Guallpa: [name] Other Clinician: Referring Trevyn Lumpkin: Treating Reggie Bise/Extender:Stone III, [name], FRED Weeks in Treatment: 90 Wound Status Wound Number: 59 Primary Diabetic Wound/Ulcer of the Lower Etiology: Extremity Wound Location: Left Lower Leg - Medial Secondary Lymphedema Wounding Event: Gradually Appeared Etiology: Date Acquired: 04/20/2019 Wound Open Weeks Of Treatment: 1 Status: Clustered Wound: No Comorbid Asthma, Hypertension, Peripheral Arterial History: Disease, Peripheral Venous Disease, Type II Diabetes, Gout, Osteoarthritis Photos Wound Measurements Length: (cm) 6 % Reduction Width: (cm) 5.1 % Reduction Depth: (cm) 0.1 Epitheliali Area: (cm) 24.033 Tunneling: Volume: (cm) 2.403 Underminin Wound Description Classification: Grade 1 Wound Margin: Distinct, outline attached Exudate Amount: Medium Exudate Type: Serosanguineous Exudate Color: red, brown Wound Bed Granulation Amount: Large (67-100%) Granulation Quality: Red Necrotic Amount: None Present (0%) Foul Odor After Cleansing: No Slough/Fibrino No Exposed Structure Fascia Exposed: No Fat Layer (Subcutaneous Tissue) Exposed: No Tendon Exposed: No Muscle Exposed: No Joint Exposed: No Bone Exposed: No in Area: -1811.9% in Volume: -1807.1% zation: Small (1-33%) No g: No Electronic Signature(s) Signed: 04/29/2019 4:02:07 PM By: Mikeal Hawthorne EMT/HBOT Signed: 05/24/2019 2:53:59 PM By: Carlene Coria RN Entered By: Mikeal Hawthorne on 04/29/2019 11:44:17 -------------------------------------------------------------------------------- Wound Assessment  Details Patient Name: Date of Service: [name]. 04/27/2019 9:30 AM Medical Record [id number] Patient Account Number: [id number] Date of Birth/Sex: Treating RN: 02/26/1951 (67 y.o. [name] Primary Care Brandyce Dimario: [name] Other Clinician: Referring Remedios Mckone: Treating Kerigan Narvaez/Extender:Stone III, [name], FRED Weeks in Treatment: 90 Wound Status Wound Number: 60 Primary Diabetic Wound/Ulcer of the Lower Etiology: Extremity Wound Location: Left Lower Leg - Posterior Secondary Lymphedema Wounding Event: Gradually Appeared Etiology: Date Acquired: 04/20/2019 Wound Open Weeks Of Treatment: 1 Status: Clustered Wound: No Comorbid Asthma,  Hypertension, Peripheral Arterial History: Disease, Peripheral Venous Disease, Type II Diabetes, Gout, Osteoarthritis Photos Wound Measurements Length: (cm) 4.2 % Reduction Width: (cm) 6 % Reduction Depth: (cm) 0.1 Epitheliali Area: (cm) 19.792 Tunneling: Volume: (cm) 1.979 Undermini Wound Description Classification: Grade 1 Wound Margin: Distinct, outline attached Exudate Amount: Medium Exudate Type: Serosanguineous Exudate Color: red, brown Wound Bed Granulation Amount: Large (67-100%) Granulation Quality: Red, Pink Necrotic Amount: Small (1-33%) Necrotic Quality: Adherent Slough Foul Odor After Cleansing: No Slough/Fibrino Yes Exposed Structure Fascia Exposed: No Fat Layer (Subcutaneous Tissue) Exposed: Yes Tendon Exposed: No Muscle Exposed: No Joint Exposed: No Bone Exposed: No in Area: -16.7% in Volume: -16.7% zation: Small (1-33%) No ng: No Electronic Signature(s) Signed: 04/29/2019 4:02:07 PM By: Mikeal Hawthorne EMT/HBOT Signed: 05/24/2019 2:53:59 PM By: Carlene Coria RN Entered By: Mikeal Hawthorne on 04/29/2019 11:44:46 -------------------------------------------------------------------------------- Wound Assessment Details Patient Name: Date of Service: [name]. 04/27/2019 9:30  AM Medical Record [id number] Patient Account Number: [id number] Date of Birth/Sex: Treating RN: 1950-07-18 (67 y.o. [name] Primary Care Neithan Day: [name] Other Clinician: Referring Kymoni Monday: Treating Pete Merten/Extender:Stone III, [name], FRED Weeks in Treatment: 90 Wound Status Wound Number: 61 Primary Venous Leg Ulcer Etiology: Wound Location: Left Lower Leg - Anterior Wound Open Wounding Event: Gradually Appeared Status: Date Acquired: 04/20/2019 Comorbid Asthma, Hypertension, Peripheral Arterial Weeks Of Treatment: 1 History: Disease, Peripheral Venous Disease, Type II Clustered Wound: No Diabetes, Gout, Osteoarthritis Photos Wound Measurements Length: (cm) 6 % Reduct Width: (cm) 2.7 % Reduct Depth: (cm) 0.1 Epitheli Area: (cm) 12.723 Tunneli Volume: (cm) 1.272 Undermi Wound Description Classification: Full Thickness Without Exposed Support Foul Od Structures Slough/ Wound Flat and Intact Margin: Exudate Medium Amount: Exudate Serosanguineous Type: Exudate red, brown Color: Wound Bed Granulation Amount: Medium (34-66%) Granulation Quality: Pink Fascia E Necrotic Amount: Medium (34-66%) Fat Laye Necrotic Quality: Adherent Slough Tendon E Muscle E Joint Ex Bone Exp or After Cleansing: No Fibrino Yes Exposed Structure xposed: No r (Subcutaneous Tissue) Exposed: Yes xposed: No xposed: No posed: No osed: No ion in Area: -68.7% ion in Volume: -68.7% alization: None ng: No ning: No Electronic Signature(s) Signed: 04/29/2019 4:02:07 PM By: Mikeal Hawthorne EMT/HBOT Signed: 05/24/2019 2:53:59 PM By: Carlene Coria RN Entered By: Mikeal Hawthorne on 04/29/2019 11:45:13 -------------------------------------------------------------------------------- Vitals Details Patient Name: Date of Service: [name]. 04/27/2019 9:30 AM Medical Record [id number] Patient Account Number: [id number] Date of Birth/Sex: Treating  RN: [birth year] (68 y.o. [name] Primary Care Arash Karstens: [name] Other Clinician: Referring Laritza Vokes: Treating Jazmen Lindenbaum/Extender:Stone III, [name], FRED Weeks in Treatment: 92 Vital Signs Time Taken: 09:50 Temperature (F): 98.6 Height (in): 62 Pulse (bpm): 83 Weight (lbs): 335 Respiratory Rate (breaths/min): 18 Body Mass Index (BMI): 61.3 Blood Pressure (mmHg): 134/77 Capillary Blood Glucose (mg/dl): 165 Reference Range: 80 - 120 mg / dl Notes CBG per patient Electronic Signature(s) Signed: 05/24/2019 2:53:59 PM By: Carlene Coria RN Entered By: Carlene Coria on 04/27/2019 09:50:47

## 2019-05-24 NOTE — Progress Notes (Signed)
[name], [name] ([id number]) Visit Report for 03/23/2019 Arrival Information Details Patient Name: Date of Service: [name], [name] 03/23/2019 10:00 AM Medical Record [id number] Patient Account Number: [id number] Date of Birth/Sex: Treating RN: 1951-03-14 (68 y.o. [name] Primary Care Gresham Caetano: [name] Other Clinician: Referring Caley Ciaramitaro: Treating Raydel Hosick/Extender:Stone III, [name], FRED Weeks in Treatment: 32 Visit Information History Since Last Visit All ordered tests and consults were completed: No Patient Arrived: Wheel Chair Added or deleted any medications: No Arrival Time: 10:26 Any new allergies or adverse reactions: No Accompanied By: husband Had a fall or experienced change in No activities of daily living that may affect Transfer Assistance: None risk of falls: Patient Identification Verified: Yes Signs or symptoms of abuse/neglect since last No Secondary Verification Process Completed: Yes visito Patient Requires Transmission-Based No Hospitalized since last visit: No Precautions: Implantable device outside of the clinic excluding No Patient Has Alerts: Yes cellular tissue based products placed in the center Patient Alerts: R ABI= since last visit: 1.01 Has Dressing in Place as Prescribed: Yes L ABI = Has Compression in Place as Prescribed: Yes .99 Pain Present Now: No Electronic Signature(s) Signed: 05/24/2019 2:59:38 PM By: Carlene Coria RN Entered By: Carlene Coria on 03/23/2019 10:27:10 -------------------------------------------------------------------------------- Compression Therapy Details Patient Name: Date of Service: [name]. 03/23/2019 10:00 AM Medical Record [id number] Patient Account Number: [id number] Date of Birth/Sex: Treating RN: [birth year] (68 y.o. [name] Primary Care Marranda Arakelian: [name] Other Clinician: Referring Evey Mcmahan: Treating Toney Difatta/Extender:Stone III, [name], FRED Weeks  in Treatment: 6 Compression Therapy Performed for Wound Wound #57 Left,Dorsal Foot Assessment: Performed By: Clinician Deon Pilling, RN Compression Type: Three Layer Post Procedure Diagnosis Same as Pre-procedure Electronic Signature(s) Signed: 03/24/2019 12:09:12 PM By: [name] RN, BSN Entered By: [name] on 03/23/2019 11:34:32 -------------------------------------------------------------------------------- Encounter Discharge Information Details Patient Name: Date of Service: [name]. 03/23/2019 10:00 AM Medical Record [id number] Patient Account Number: [id number] Date of Birth/Sex: Treating RN: 1951/06/11 (67 y.o. [name] Primary Care Curtiss Mahmood: [name] Other Clinician: Referring Lanny Donoso: Treating Roxy Mastandrea/Extender:Stone III, [name], FRED Weeks in Treatment: 61 Encounter Discharge Information Items Discharge Condition: Stable Ambulatory Status: Wheelchair Discharge Destination: Home Transportation: Private Auto Accompanied By: self Schedule Follow-up Appointment: Yes Clinical Summary of Care: Electronic Signature(s) Signed: 03/23/2019 6:07:42 PM By: Deon Pilling Entered By: Deon Pilling on 03/23/2019 11:57:50 -------------------------------------------------------------------------------- Lower Extremity Assessment Details Patient Name: Date of Service: [name], [name] 03/23/2019 10:00 AM Medical Record [id number] Patient Account Number: [id number] Date of Birth/Sex: Treating RN: 1950-10-20 (67 y.o. [name] Primary Care Elana Jian: [name] Other Clinician: Referring Caelum Federici: Treating Marshun Duva/Extender:Stone III, [name], FRED Weeks in Treatment: 51 Edema Assessment Assessed: [Left: No] [Right: No] Edema: [Left: Yes] [Right: Yes] Calf Left: Right: Point of Measurement: 37 cm From Medial Instep 42 cm cm Ankle Left: Right: Point of Measurement: 10 cm From Medial Instep 28 cm cm Electronic  Signature(s) Signed: 05/24/2019 2:59:38 PM By: Carlene Coria RN Entered By: Carlene Coria on 03/23/2019 10:35:33 -------------------------------------------------------------------------------- Multi-Disciplinary Care Plan Details Patient Name: Date of Service: [name]. 03/23/2019 10:00 AM Medical Record [id number] Patient Account Number: [id number] Date of Birth/Sex: Treating RN: 03-May-1951 (67 y.o. [name] Primary Care Brice Potteiger: [name] Other Clinician: Referring Zadiel Leyh: Treating Desarai Barrack/Extender:Stone III, [name], FRED Weeks in Treatment: 30 Active Inactive Venous Leg Ulcer Nursing Diagnoses: Actual venous Insuffiency (use after diagnosis is confirmed) Knowledge deficit related to disease process and management Goals: Patient will maintain optimal edema control Date Initiated: 08/12/2017  Target Resolution Date: 04/06/2019 Goal Status: Active Patient/caregiver will verbalize understanding of disease process and disease management Date Initiated: 08/12/2017 Date Inactivated: 04/21/2018 Target Resolution Date: 04/24/2018 Goal Status: Met Interventions: Assess peripheral edema status every visit. Compression as ordered Treatment Activities: Therapeutic compression applied : 08/12/2017 Notes: Wound/Skin Impairment Nursing Diagnoses: Impaired tissue integrity Knowledge deficit related to ulceration/compromised skin integrity Goals: Patient/caregiver will verbalize understanding of skin care regimen Date Initiated: 08/12/2017 Target Resolution Date: 04/06/2019 Goal Status: Active Ulcer/skin breakdown will have a volume reduction of 30% by week 4 Date Initiated: 08/05/2017 Date Inactivated: 09/30/2017 Target Resolution Date: 10/03/2017 Goal Status: Met Ulcer/skin breakdown will have a volume reduction of 50% by week 8 Date Initiated: 09/30/2017 Date Inactivated: 10/28/2017 Target Resolution Date: 10/28/2017 Goal Status: Met Interventions: Assess  patient/caregiver ability to perform ulcer/skin care regimen upon admission and as needed Assess ulceration(s) every visit Provide education on ulcer and skin care Screen for HBO Treatment Activities: Patient referred to home care : 08/05/2017 Skin care regimen initiated : 08/05/2017 Topical wound management initiated : 08/05/2017 Notes: Electronic Signature(s) Signed: 03/24/2019 12:09:12 PM By: [name] RN, BSN Entered By: [name] on 03/23/2019 11:32:47 -------------------------------------------------------------------------------- Pain Assessment Details Patient Name: Date of Service: [name], [name] 03/23/2019 10:00 AM Medical Record [id number] Patient Account Number: [id number] Date of Birth/Sex: Treating RN: May 30, 1951 (67 y.o. [name] Primary Care Sahvannah Rieser: [name] Other Clinician: Referring Louretta Tantillo: Treating Mervil Wacker/Extender:Stone III, [name], FRED Weeks in Treatment: 16 Active Problems Location of Pain Severity and Description of Pain Patient Has Paino No Site Locations Pain Management and Medication Current Pain Management: Electronic Signature(s) Signed: 05/24/2019 2:59:38 PM By: Carlene Coria RN Entered By: Carlene Coria on 03/23/2019 10:27:54 -------------------------------------------------------------------------------- Patient/Caregiver Education Details Patient Name: [name] 10/7/2020andnbsp10:00 Date of Service: AM Medical Record [id number] Number: Patient Account Number: [id number] Treating RN: Sep 22, 1950 (67 y.o. [name] Date of Birth/Gender: F) Other Clinician: Primary Care Physician:SMITH, FRED Treating [name] Referring Physician: Physician/Extender: Wilhelmenia Blase in Treatment: 25 Education Assessment Education Provided To: Patient Education Topics Provided Venous: Methods: Explain/Verbal Responses: Reinforcements needed, State content correctly Wound/Skin Impairment: Methods:  Explain/Verbal Responses: Reinforcements needed, State content correctly Electronic Signature(s) Signed: 03/24/2019 12:09:12 PM By: [name] RN, BSN Entered By: [name] on 03/23/2019 11:33:30 -------------------------------------------------------------------------------- Wound Assessment Details Patient Name: Date of Service: [name]. 03/23/2019 10:00 AM Medical Record [id number] Patient Account Number: [id number] Date of Birth/Sex: Treating RN: Oct 20, 1950 (67 y.o. [name] Primary Care Laurabeth Yip: [name] Other Clinician: Referring Hanan Mcwilliams: Treating Earla Charlie/Extender:Stone III, [name], FRED Weeks in Treatment: 84 Wound Status Wound Number: 56 Primary Diabetic Wound/Ulcer of the Lower Extremity Etiology: Wound Location: Left Ankle - Posterior Wound Healed - Epithelialized Wounding Event: Gradually Appeared Status: Date Acquired: 01/12/2019 Comorbid Asthma, Hypertension, Peripheral Arterial Weeks Of Treatment: 10 History: Disease, Peripheral Venous Disease, Type II Clustered Wound: No Diabetes, Gout, Osteoarthritis Photos Wound Measurements Length: (cm) 0 % Reduction Width: (cm) 0 % Reduction Depth: (cm) 0 Epitheliali Area: (cm) 0 Tunneling: Volume: (cm) 0 Underminin Wound Description Classification: Grade 2 Foul Odor A Wound Margin: Indistinct, nonvisible Slough/Fibr Exudate Amount: None Present Wound Bed Granulation Amount: None Present (0%) Necrotic Amount: None Present (0%) Fascia Expo Fat Layer ( Tendon Expo Muscle Expo Joint Expos Bone Expose fter Cleansing: No ino No Exposed Structure sed: No Subcutaneous Tissue) Exposed: No sed: No sed: No ed: No d: No in Area: 100% in Volume: 100% zation: Large (67-100%) No g: No Electronic Signature(s) Signed: 03/24/2019 1:38:24  PM By: Mikeal Hawthorne EMT/HBOT Signed: 05/24/2019 2:59:38 PM By: Carlene Coria RN Entered By: Mikeal Hawthorne on 03/24/2019  10:26:25 -------------------------------------------------------------------------------- Wound Assessment Details Patient Name: Date of Service: [name]. 03/23/2019 10:00 AM Medical Record [id number] Patient Account Number: [id number] Date of Birth/Sex: Treating RN: Apr 16, 1951 (67 y.o. [name] Primary Care Soriyah Osberg: [name] Other Clinician: Referring Zuleima Haser: Treating Tessy Pawelski/Extender:Stone III, [name], FRED Weeks in Treatment: 33 Wound Status Wound Number: 57 Primary Diabetic Wound/Ulcer of the Lower Extremity Etiology: Wound Location: Left Foot - Dorsal Wound Open Wounding Event: Gradually Appeared Status: Date Acquired: 02/04/2019 Comorbid Asthma, Hypertension, Peripheral Arterial Weeks Of Treatment: 6 History: Disease, Peripheral Venous Disease, Type II Clustered Wound: No Diabetes, Gout, Osteoarthritis Photos Wound Measurements Length: (cm) 0.2 % Reduction Width: (cm) 0.3 % Reduction Depth: (cm) 0.1 Epithelializ Area: (cm) 0.047 Tunneling: Volume: (cm) 0.005 Undermining Wound Description Classification: Grade 2 Foul Odor A Exudate Amount: Small Slough/Fibr Exudate Type: Serosanguineous Exudate Color: red, brown Wound Bed Granulation Amount: Medium (34-66%) Granulation Quality: Pink, Pale Fascia Expos Necrotic Amount: Medium (34-66%) Fat Layer (S Necrotic Quality: Adherent Slough Tendon Expos Muscle Expos Joint Expose Bone Exposed Electronic Signature(s) Signed: 03/24/2019 1:38:24 PM By: Mikeal Hawthorne EMT/HBOT Signed: 05/24/2019 2:59:38 PM By: Carlene Coria RN Entered By: Mikeal Hawthorne on 10/08/2 fter Cleansing: No ino Yes Exposed Structure ed: No ubcutaneous Tissue) Exposed: Yes ed: No ed: No d: No : No 020 10:27:01 in Area: 85% in Volume: 83.9% ation: None No : No -------------------------------------------------------------------------------- Vitals Details Patient Name: Date of Service: [name], [name]  03/23/2019 10:00 AM Medical Record [id number] Patient Account Number: [id number] Date of Birth/Sex: Treating RN: [birth year] (68 y.o. [name] Primary Care Albie Arizpe: [name] Other Clinician: Referring Uriah Philipson: Treating Pearce Littlefield/Extender:Stone III, [name], FRED Weeks in Treatment: 15 Vital Signs Time Taken: 10:27 Temperature (F): 98.3 Height (in): 62 Pulse (bpm): 113 Weight (lbs): 335 Respiratory Rate (breaths/min): 18 Body Mass Index (BMI): 61.3 Blood Pressure (mmHg): 171/92 Capillary Blood Glucose (mg/dl): 184 Reference Range: 80 - 120 mg / dl Notes CBg per patient Electronic Signature(s) Signed: 05/24/2019 2:59:38 PM By: Carlene Coria RN Entered By: Carlene Coria on 03/23/2019 10:27:48

## 2019-06-01 ENCOUNTER — Other Ambulatory Visit: Payer: Self-pay

## 2019-06-01 ENCOUNTER — Encounter (HOSPITAL_BASED_OUTPATIENT_CLINIC_OR_DEPARTMENT_OTHER): Payer: Medicare PPO | Admitting: Physician Assistant

## 2019-06-01 DIAGNOSIS — E11621 Type 2 diabetes mellitus with foot ulcer: Secondary | ICD-10-CM | POA: Diagnosis not present

## 2019-06-01 NOTE — Progress Notes (Addendum)
[name], [name] ([id number]) Visit Report for 06/01/2019 Chief Complaint Document Details Patient Name: Date of Service: [name], [name] 06/01/2019 10:00 AM Medical Record [id number] Patient Account Number: [id number] Date of Birth/Sex: Treating RN: December 02, 1950 (68 y.o. [name] Primary Care Provider: Fatima Sanger Other Clinician: Referring Provider: Treating Provider/Extender:Stone III, Flo Shanks, FRED Weeks in Treatment: 95 Information Obtained from: Patient Chief Complaint Bilateral reoccurring LE ulcers Electronic Signature(s) Signed: 06/01/2019 10:09:32 AM By: Lenda Kelp PA-C Entered By: Lenda Kelp on 06/01/2019 10:09:32 -------------------------------------------------------------------------------- Debridement Details Patient Name: Date of Service: [name]. 06/01/2019 10:00 AM Medical Record [id number] Patient Account Number: [id number] Date of Birth/Sex: 06/02/1951 (68 y.o. F) Treating RN: Zandra Abts Primary Care Provider: Fatima Sanger Other Clinician: Referring Provider: Treating Provider/Extender:Stone III, Flo Shanks, FRED Weeks in Treatment: 95 Debridement Performed for Wound #64 Left,Dorsal Foot Assessment: Performed By: Physician Lenda Kelp, PA Debridement Type: Debridement Severity of Tissue Pre Fat layer exposed Debridement: Level of Consciousness (Pre- Awake and Alert procedure): Pre-procedure Verification/Time Out Taken: Yes - 10:25 Start Time: 10:25 Total Area Debrided (L x W): 3 (cm) x 3 (cm) = 9 (cm) Tissue and other material Non-Viable, Callus, Skin: Epidermis, Biofilm debrided: Level: Skin/Epidermis Debridement Description: Selective/Open Wound Instrument: Curette Bleeding: Minimum End Time: 10:28 Procedural Pain: 0 Post Procedural Pain: 0 Response to Treatment: Procedure was tolerated well Level of Consciousness Awake and Alert (Post-procedure): Post Debridement Measurements of Total  Wound Length: (cm) 0.7 Width: (cm) 0.3 Depth: (cm) 0.3 Volume: (cm) 0.049 Character of Wound/Ulcer Post Improved Debridement: Severity of Tissue Post Debridement: Fat layer exposed Post Procedure Diagnosis Same as Pre-procedure Electronic Signature(s) Signed: 06/01/2019 5:37:18 PM By: Lenda Kelp PA-C Signed: 06/02/2019 5:32:06 PM By: Zandra Abts RN, BSN Entered By: Zandra Abts on 06/01/2019 10:30:34 -------------------------------------------------------------------------------- HPI Details Patient Name: Date of Service: [name]. 06/01/2019 10:00 AM Medical Record [id number] Patient Account Number: [id number] Date of Birth/Sex: Treating RN: 1950-11-17 (68 y.o. [name] Primary Care Provider: Fatima Sanger Other Clinician: Referring Provider: Treating Provider/Extender:Stone III, Flo Shanks, FRED Weeks in Treatment: 95 History of Present Illness HPI Description: this patient has been seen a couple of times before and returns with recurrent problems to her right and left lower extremity with swelling and weeping ulcerations due to not wearing her compression stockings which she had been advised to do during her last discharge, at the end of June 2018. During her last visit the patient had had normal arterial blood flow and her venous reflux study did not necessitate any surgical intervention. She was recommended compression and elevation and wound care. After prolonged treatment the patient was completely healed but she has been noncompliant with wearing or compressions.. She was here last week with an outpatient return visit planned but the patient came in a very poor general condition with altered mental status and was rushed to the ER on my request. With a history of hypertension, diabetes, TIA and right-sided weakness she was set up for an MRI on her brain and cervical spine and was sent to [facility]. Getting an MRI done was very difficult  but once the workup was done she was found not to have any spinal stenosis, epidural abscess or hematoma or discitis. This was radiculopathy to be treated as an outpatient and she was given a follow-up appointment. Today she is feeling much better alert and oriented and has come to reevaluate her bilateral lower extremity lymphedema and ulceration 03/25/2017 -- she was admitted to  the hospital on 03/16/2017 and discharged on 03/18/2017 with left leg cellulitis and ulceration. She was started on vancomycin and Zosyn and x-ray showed no bony involvement. She was treated for a cellulitis with IV antibiotics changed to Rocephin and Flagyl and was discharged on oral Keflex and doxycycline to complete a 7 day course. Last hemoglobin A1c was 7.1 and her other ailments including hypertension got asthma were appropriately treated. 05/06/2017 -- she is awaiting the right size of compression stockings from [employer] but other than that has been doing well. ====== Old notes 68 year old patient was seen one time last October and was lost to follow-up. She has recurrent problems with weeping and ulceration of her left lower extremity and has swelling of this for several years. It has been worse for the last 2 months. Past medical history is significant for diabetes mellitus type 2, hypertension, gout, morbid obesity, depressive disorders, hiatal hernia, migraines, status post knee surgery, risk of a cholecystectomy, vaginal hysterectomy and breast biopsy. She is not a smoker. As noted before she has never had a venous duplex study and an arterial ABI study was attempted but the left lower extremity was noncompressible 10/01/2016 -- had a lower extremity venous duplex reflux evaluation which showed no evidence of deep vein reflux in the right or left lower extremity, and no evidence of great saphenous vein reflux more than 500 ms in the right or left lower extremity, and the left small saphenous vein is  incompetent but no vascular consult was recommended. review of her electronic medical records noted that the ABI was checked in July 2017 where the right ABI was normal limits and the left ABI could not be ascertained due to pain with cuff pressure but the waveforms are within normal limits. her arterial duplex study scheduled for April 27. 10/08/2016 -- the patient has various reasons for not having a compression on and for the last 3 days she has had no compression on her left lower extremity either due to pain or the lack of nursing help. She does not use her juxta lites either. 10/15/2016 -- the patient did not keep her appointment for arterial duplex study on April 27 and I have asked her to reschedule this. Her pain is out of proportion with the physical findings and she continuously fails to wear a compression wraps and cuts them off because she says she cannot tolerate the pain. She does not use her juxta lites either. 10/22/2016 -- he has rescheduled her arterial duplex study to May 21 and her pain today is a bit better. She has not been wearing her juxta lites on her right lower extremity but now understands that she needs to do this. She did tolerate the to press compression wrap on her left lower extremity 10/29/2016 --arterial duplex study is scheduled for next week and overall she has been tolerating her compression wraps and also using her juxta lites on her right lower extremity 11/05/2016 -- the right ABI was 0.95 the left was 1.03. The digit TBI is on the right was 0.83 on the left was 0.92 and she had biphasic flow through these vessels. The impression was that of normal lower extremity arterial study. 11/12/2016 -- her pain is minimal and she is doing very well overall. 11/26/2016 -- she has got juxta lites and her insurance will not pay for additional dual layer compression stockings. She is going to order some from [employer]. 05/12/2017 -- her juxta lites are very old and  too big for  her and these have not been helping with compression. She did get 20-30 mm compression stockings from [employer] but she and her husband are unable to put these on. I believe she will benefit from bilateral Extremit-ease, compression stockings and we will measure her for these today. 05/20/2017 -- lymphedema on the left lower extremity has increased a lot and she has a open ulceration as a result of this. The right lower extremity is looking pretty good. She has decided to by the compression stockings herself and will get reimbursed by the home health, at a later date. 05/27/2017 -- her sciatica is bothering her a lot and she thought her left leg pain was caused due to the compression wrap and hence removed it and has significant lymphedema. There is no inflammation on this left lower extremity. 06/17/17 on evaluation today patient appears to be doing very well and in fact is completely healed in regard to her ulcerations. Unfortunately however she does have continued issues with lymphedema nonetheless. We did order compression garments for her unfortunately she states that the size that she received were large although we ordered medium. Obviously this means she is not getting the optimal compression. She does not have those with her today and therefore we could not confirm and contact the company on her behalf. Nonetheless she does state that she is going to have her husband bring them by tomorrow so that we can verify and then get in touch with the company. No fevers, chills, nausea, or vomiting noted at this time. Overall patient is doing better otherwise and I'm pleased with the progress she has made. 07/01/17 on evaluation today patient appears to be doing very well in regard to her bilateral lower extremity she does not have any openings at this point which is excellent news. Overall I'm pleased with how things have progressed up to this time. Since she is doing so well we did order  her compression which we are seeing her today to ensure that it fits her properly and everything is doing well in that regard and then subsequently she will be discharged. ============ Old Notes: 03/31/16 patient presents today for evaluation concerning open wounds that she has over the left medial ankle region as well as the left dorsal foot. She has previously had this occur although it has been healed for a number of months after having this for about a year prior until her hospitalization on 01/05/16. At that point in time it appears that she was admitted to the hospital for left lower extremity cellulitis and was placed on vancomycin and Zosyn at that point. Eventually upon discharge on January 15, 2016 she was placed on doxycycline at that point in time. Later on 03/27/16 positive wound culture growing Escherichia coli this was switched to amoxicillin. Currently she tells me that she is having pain radiated to be a 7 out of 10 which can be as high as 10 out of 10 with palpation and manipulation of the wound. This wound appears to be mainly venous in nature due to the bilateral lower extremity venous stasis/lymphedema. This is definitely much worse on her left than the right side. She does have type 1 diabetes mellitus, hypertension, morbid obesity, and is wheelchair dependent.during the course of the hospital stay a blood culture was also obtained and fortunately appeared negative. She also had an x-ray of the tibia/fibula on the left which showed no acute bone abnormality. Her white blood cell count which was performed last on 03/25/16 was  7.3, hemoglobin 12.8, protein 7.1, albumin 3.0. Her urine culture appeared to be negative for any specific organisms. Patient did have a left lower extremity venous duplex evaluation for DVT. This did not include venous reflux studies but fortunately was negative for DVT. Patient also had arterial studies performed which revealed that she had a normal ABI on  the right though this was unable to be performed on the left secondary to pain that she was having around the ankle region due to the wound. However it was stated on report that she had biphasic pulses and apparently good blood flow. ========== 06/03/17 she is here in follow-up evaluation for right lower extremity ulcer. The right lower sure he has healed but she has reopened to the left medial malleolus and dorsal foot with weeping. She is waiting for new compression garments to arrive from home health, the previous compression garments were ill fitting. We will continue with compression bilaterally and follow-up in 2 weeks Readmission: 08/05/17 on evaluation today patient appears to be doing somewhat poorly in regard to her left lower extremity especially although the right lower extremity has a small area which may no longer be open. She has been having a lot of drainage from the left lower extremity however he tells me that she has not been able to use the EXTREMIT- EASE Compression at this point. She states that she did better and was able to actually apply the Juxta-Lite compression although the wound that she has is too large and therefore really does not compress which is why she cannot wear it at this point. She has no one who can help her put it on regular basis her son can sometimes but he's not able to do it most of the time. I do believe that's why she has begun to weave and have issues as she is currently yet again. No fevers, chills, nausea, or vomiting noted at this time. Patient is no evidence of dementia. 08/12/17 on evaluation today patient appears to still be doing fairly well in regard to the draining areas/weeping areas at this point. With that being said she unfortunately did go to the ER yesterday due to what was felt to be possibly a cellulitis. They place her on doxycycline by mouth and discharge her home. She definitely was not admitted. With that being said she states  she has had more discomfort which has been unusual for her even compared to prior times and she's had infections.08/12/17 on evaluation today patient appears to still be doing fairly well in regard to the draining areas/weeping areas at this point. With that being said she unfortunately did go to the ER yesterday due to what was felt to be possibly a cellulitis. They place her on doxycycline by mouth and discharge her home. She definitely was not admitted. With that being said she states she has had more discomfort which has been unusual for her even compared to prior times and she's had infections. 08/19/17 put evaluation today patient tells me that she's been having a lot of what sounds to be neuropathic type pain in regard to her left lower extremity. She has been using over-the-counter topical bins again which some believe. That in order to apply the she actually remove the wrap we put on her last Wednesday on Thursday. Subsequently she has not had anything on compression wise since that time. The good news is a lot of the weeping areas appear to have closed at this point again I believe she would  do better with compression but we are struggling to get her to actually use what she needs to at this point. No fevers, chills, nausea, or vomiting noted at this time. 09/03/17 on evaluation today patient appears to be doing okay in regard to her lower extremities in regard to the lymphedema and weeping. Fortunately she does not seem to show any signs of infection at this point she does have a little bit of weeping occurring in the right medial malleolus area. With that being said this does not appear to be too significant which is good news. 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 She has severe skin damage secondary to both of these features involving the dorsal left foot and medial left ankle and lower leg. Still has open areas in  the left anterior foot. The area on the right closed over. She uses her own juxta light stockings. She does not have an arterial issue 09/16/17 on evaluation today patient actually appears to be doing excellent in regard to her bilateral lower extremity swelling. The Juxta-Lite compression wrap seem to be doing very well for her. She has not however been using the portion that goes over her foot. Her left foot still is draining a little bit not nearly as significant as it has been in the past but still I do believe that she likely needs to utilize the full wrap including the foot portion of this will improve as well. She also has been apparently putting on a significant amount of Vaseline which also think is not helpful for her. I recommended that if she feels she needs something for moisturizer Eucerin will probably be better. 09/30/17 on evaluation today patient presents with several new open areas in regard to her left lower extremity although these appear to be minimal and mainly seem to be more moisture breakdown than anything. Fortunately she does not seem to have any evidence of infection which is great news. She has been tolerating the dressing changes without complication we are using silver alginate on the foot she has been using AB pads to have the legs and using her Juxta-Lite compression which seems to be controlling her swelling very well. Overall I'm pleased with the poor way she has progressed. 10/14/17 on evaluation today patient appears to be doing better in regard to her left lower extremity areas of weeping. She does still have some discomfort although in general this does not appear to be as macerated and I think it is progressing nicely. I do think she still needs to wear the foot portion of her Juxta-Lite in order to get the most benefit from the wrap obviously. She states she understands. Fortunately there does not appear to be evidence of infection at this time which is great  news. 10/28/17 on evaluation today patient appears to be doing excellent in regard to her left lower extremity. She has just a couple areas that are still open and seem to be causing any trouble whatsoever. For that reason I think that she is definitely headed in the right direction the spots are very tiny compared to what we have been dealing with in the past. 11/11/17 on evaluation today patient appears to have a right lateral lower extremity ulcer that has opened since I last saw her. She states this is where the home health nurse that was coming out remove the dressing without wetting the alginate first. Nonetheless I do not know if this is indeed  the case or not but more importantly we have not ordered home help to be coming out for her wounds at all. I'm unsure as to why they are coming out and we're gonna have to check on this and get things situated in that regard. With that being said we currently really do not need them to be coming out as the patient has been taking care of her leg herself without complication and no issues. In fact she was doing much better prior to nursing coming out. 11/25/17 on evaluation today patient actually appears to be doing fairly well in regard to her left lower extremity swelling. In fact she has very little area of weeping at this point there's just a small spot on the lateral portion of her right leg that still has me just a little bit more concerned as far as wanting to see this clear up before I discharge her to caring for this at home. Nonetheless overall she has made excellent progress. 12/09/17 on evaluation today patient appears to be doing rather well in regard to her lower extremity edema. She does have some weeping still in the left lower extremity although the big area we were taking care of two weeks ago actually has closed and she has another area of weeping on the left lower extremity immediately as well is the top of her foot. She does not  currently have lymphedema pumps she has been wearing her compression daily on a regular basis as directed. With that being said I think she may benefit from lymphedema pumps. 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 We had a very difficult time getting and keeping this under control. 12/23/17 on evaluation today patient actually appears to be doing a little bit more poorly in regard to her bilateral lower extremities. She has been tolerating the Juxta-Lite compression wraps. Unfortunately she has two new ulcers on the right lower extremity and left lower Trinity ulceration seems to be larger. Obviously this is not good news. She has been tolerating the dressings without complication. 12/30/17 on evaluation today patient actually appears to be doing much better in regard to her bilateral lower extremity edema. She continues to have some issues with ulcerations and in fact there appears to be one spot on each leg where the wrap may have caused a little bit of a blister which is subsequently opened up at this point is given her pain. Fortunately it does not appear to be any evidence of infection which is good news. No fevers chills noted. 01/13/18 on evaluation today patient appears to be doing rather well in regard to her bilateral lower extremities. The dressings did get kind of stuck as far as the wound beds are concerned but again I think this is mainly due to the fact that she actually seems to be showing signs of healing which is good news. She's not having as much drainage therefore she was having more of the dressing sticking. Nonetheless overall I feel like her swelling is dramatically down compared to previous. 01/20/18 on evaluation today patient unfortunately though she's doing better in most regards has a large blister on the left anterior  lower extremity where she is draining quite significantly. Subsequently this is going to need debridement today in order to see what's underneath and ensure she does not continue to trapping fluid  at this location. Nonetheless No fevers, chills, nausea, or vomiting noted at this time. 01/27/18 on evaluation today patient appears to be doing rather well at this point in regard to her right lower extremity there's just a very small area that she still has open at this point. With that being said I do believe that she is tolerating the compression wraps very well in making good progress. Home health is coming out at this point to see her. Her left lower extremity on the lateral portion is actually what still mainly open and causing her some discomfort for the most part 02/10/18 on evaluation today patient actually appears to be doing very well in regard to her right lower extremity were all the ulcers appear to be completely close. In regard to the left lower extremity she does have two areas still open and some leaking from the dorsal surface of her foot but this still seems to be doing much better to me in general. 02/24/18 on evaluation today patient actually appears to be doing much better in regard to her right lower extremity this is still completely healed. Her left lower extremity is also doing much better fortunately she has no evidence of infection. The one area that is gonna require some debridement is still on the left anterior shin. Fortunately this is not hurting her as badly today. 03/10/18 on evaluation today patient appears to be doing better in some regards although she has a little bit more open area on the dorsal foot and she also has some issues on the medial portion of the left lower extremity which is actually new and somewhat deep. With that being said there fortunately does not appear to be any significant signs of infection which is good news. No fevers, chills, nausea, or  vomiting noted at this time. In general her swelling seems to be doing fairly well which is good news. 03/31/18 on evaluation today patient presents for follow-up concerning her left lower extremity lymphedema. Unfortunately she has been doing a little bit more poorly since I last saw her in regard to the amount of weeping that she is experiencing. She's also having some increased pain in the anterior shin location. Unfortunately I do not feel like the patient is making such good progress at this point a few weeks back she was definitely doing much better. 04/07/18 on evaluation today patient actually appears to be showing some signs of improvement as far as the left lower extremity is concerned. She has been tolerating the dressing changes and it does appear that the Drawtex did better for her. With that being said unfortunately home health is stating that they cannot obtain the Drawtex going forward. Nonetheless we're gonna have to check and see what they may be able to get the alginate they were using was getting stuck in causing new areas of skin being pulled all that with and subsequently weep and calls her to worsen overall this is the first time we've seen improvement at this time. 04/14/18 on evaluation today patient actually appears to be doing rather well at this point there does not appear to be any evidence of infection at this time and she is actually doing excellent in regard to the weeping in fact she almost has no openings remaining even compared to just last week this is a dramatic improvement. No fevers chills noted 04/21/18 evaluation today patient actually appears to be doing very well. She in fact is has a small area on the posterior lower  extremity location and she has a small area on the dorsal surface of her foot that are still open both of which are very close to closing. We're hoping this will be close shortly. She brought her Juxta-Lite wrap with her today hoping that  would be able to put her in it unfortunately I don't think were quite at that point yet but we're getting closer. 04/28/18 upon evaluation today patient actually appears to be doing excellent in regard to her left lower extremity ulcer. In fact the region on the posterior lower extremity actually is much smaller than previously noted. Overall I'm very happy with the progress she has made. She again did bring her Juxta-Lite although we're not quite ready for that yet. 05/11/18 upon evaluation today patient actually appears to be doing in general fairly well in regard to her left lower Trinity. The swelling is very well controlled. With that being said she has a new area on the left anterior lower extremity as well as between the first and second toes of her left foot that was not present during the last evaluation. The region of her posterior left lower extremity actually appears to be almost completely healed. To be honest I'm very pleased with the way that stands. Nonetheless I do believe that the lotion may be keeping the area to moist as far as her legs are concerned subsequently I'm gonna consider discontinuing that today. 05/26/18 on evaluation today patient appears to be doing rather well in regard to her left lower should be ulcers. In fact everything appears to be close except for a very small area on the left posterior lower extremity. Fortunately there does not appear to be any evidence of infection at this time. Overall very pleased with her progress. 06/02/18 and evaluation today patient actually appears to be doing very well in regard to her lower extremity ulcers. She has one small area that still continues to weep that I think may benefit her being able to justify lotion and user Juxta-Lite wraps versus continued to wrap her. Nonetheless I think this is something we can definitely look into at this point. 06/23/18 on evaluation today patient unfortunately has openings of her bilateral  lower extremities. In general she seems to be doing much worse than when I last saw her just as far as her overall health standpoint is concerned. She states that her discomfort is mainly due to neuropathy she's not having any other issues otherwise. No fevers, chills, nausea, or vomiting noted at this time. 06/30/18 on evaluation today patient actually appears to be doing a little worse in regard to her right lower extremity her left lower extremity of doing fairly well. Fortunately there is no sign of infection at this time. She has been tolerating the dressing changes without complication. Home health did not come out like they were supposed to for the appropriate wrap changes. They stated that they never received the orders from [place] which were fax. Nonetheless we will send a copy of the orders with the patient today as well. 07/07/18 on evaluation today patient appears to be doing much better in regard to lower extremities. She still has several openings bilaterally although since I last saw her her legs did show obvious signs of infection when she later saw her nurse. Subsequently a culture was obtained and she is been placed on Bactrim and Keflex. Fortunately things seem to be looking much better it does appear she likely had an infection. Again last week we'd even discussed  it but again there really was not any obvious sign that she had infection therefore we held off on the antibiotics. Nonetheless I'm glad she's doing better today. 07/14/18 on evaluation today patient appears to be doing much better regarding her bilateral lower Trinity's. In fact on the right lower for me there's nothing open at this point there are some dry skin areas at the sites where she had infection. Fortunately there is no evidence of systemic infection which is excellent news. No fevers chills noted 07/21/18 on evaluation today patient actually appears to be doing much better in regard to her left lower  extremity ulcers. She is making good progress and overall I feel like she's improving each time I see her. She's having no pain I do feel like the infection is completely resolved which is excellent news. No fevers, chills, nausea, or vomiting noted at this time. 07/28/18 on evaluation today patient appears to be doing very well in regard to her left lower [employer]. Everything seems to be showing signs of improvement which is excellent news. Overall very pleased with the progress that has been made. Fortunately there's no evidence of active infection at this time also excellent news. 08/04/18 on evaluation today patient appears to be doing more poorly in regard to her bilateral lower extremities. She has two new areas open up on the right and these were completely closed as of last week. She still has the two spots on the left which in my pinion seem to be doing better. Fortunately there's no evidence of infection again at this point. 08/11/18 on evaluation today patient actually appears to be doing very well in regard to her bilateral lower Trinity wounds that all seem to be doing better and are measures smaller today. Fortunately there's no signs of infection. No fevers, chills, nausea, or vomiting noted at this time. 08/18/18 on evaluation today patient actually appears to be doing about the same inverter bilateral lower extremities. She continues to have areas that blistering open as was drain that fortunately nothing too significant. Overall I feel like Drawtex may have done better for her however compared to the collagen. 08/25/18 on evaluation today patient appears to be doing a little bit more poorly today even compared to last time I saw her. Again I'm not exactly sure why she's making worse progress over the past several weeks. I'm beginning to wonder if there is some kind of underlying low level infection causing this issue. I did actually take a culture from the left anterior lower  extremity but it was a new wound draining quite a bit at this point. Unfortunately she also seems to be having more pain which is what also makes me worried about the possibility of infection. This is despite never erythema noted at this point. 09/01/18 on evaluation today patient actually appears to be doing a little worse even compared to last week in regard to bilateral lower extremities. She did go to the hospital on the 16th was given a dose of IV Zosyn and then discharged with a recommendation to continue with the Bactrim that I previously prescribed for her. Nonetheless she is still having a lot of discomfort she tells me as well at this time. This is definitely unfortunate. No fevers, chills, nausea, or vomiting noted at this time. 09/08/18 on evaluation today patient's bilateral lower extremities actually appear to be shown signs of improvement which is good news. Fortunately there does not appear to be any signs of active infection I  think the anabiotic is helping in this regard. Overall I'm very pleased with how she is progressing. 09/15/18 patient was actually seen in ER yesterday due to her legs as well unfortunately. She states she's been having a lot of pain and discomfort as well as a lot of drainage. Upon inspection today the patient does have a lot of swelling and drainage I feel like this is more related to lymphedema and poor fluid control than it is to infection based on what I'm seeing. The physician in the emergency department also doubted that the patient was having a significant infection nonetheless I see no evidence of infection obvious at this point although I do see evidence of poor fluid control. She still not using a compression pumps, she is not elevating due to her lift chair as well as her hospital bed being broken, and she really is not keeping her legs up as much as they should be and also has been taking off her wraps. All this combined I think has led to poor fluid  control and to be honest she may be somewhat volume overloaded in general as well. I recommend that she may need to contact your physician to see if a prescription for a diuretic would be beneficial in their opinion. As long as this is safe I think it would likely help her. 09/29/18 on evaluation today patient's left lower extremity actually appears to be doing quite a bit better. At least compared to last time that I saw her. She still has a large area where she is draining from but there's a lot of new skin speckled trout and in fact there's more new skin that there are open areas of weeping and drainage at this point. This is good news. With regard to the right lower extremity this is doing much better with the only open area that I really see being a dry spot on the right lateral ankle currently. Fortunately there's no signs of active infection at this time which is good news. No fevers, chills, nausea, or vomiting noted at this time. The patient seems somewhat stressed and overwhelmed during the visit today she was very lethargic as such. She does and she is not taking any pain medications at this point. Apparently according to her husband are also in the process of moving which is probably taking its toll on her as well. 10/06/18 on evaluation today patient appears to be doing rather well in regard to her lower extremities compared to last evaluation. Fortunately there's no signs of active infection. She tells me she did have an appointment with her primary. Nonetheless he was concerned that the wounds were somewhat deep based on pictures but we never actually saw her legs. She states that he had her somewhat worried due to the fact that she was fearing now that she was [place] have to have an amputation. With that being said based on what I'm seeing check she looks better this week that she has the last two times I've seen her with much less drainage I'm actually pleased in this regard. That  doesn't mean that she's out of the water but again I do not think what the point of talking about education at all in regard to her leg. She is very happy to hear this. She is also not having as much pain as she was having last week. 10/13/18 unfortunately on evaluation today patient still continues to have a significant amount of drainage she's not letting home health  actually apply the compression dressings at this point. She's trying to use of Juxta-Lite of the top of Kerlex and the second layer of the three layer compression wrap. With that being said she just does not seem to be making as good a progress as I would expect if she was having the compression applied and in place on a regular basis. No fevers, chills, nausea, or vomiting noted at this time. 10/20/18 on evaluation today patient appears to be doing a little better in regard to her bilateral lower extremity ulcers. In fact the right lower extremity seems to be healed she doesn't even have any openings at this point left lower extremity though still somewhat macerated seems to be showing signs of new skin growth at multiple locations throughout. Fortunately there's no evidence of active infection at this time. No fevers, chills, nausea, or vomiting noted at this time. 10/27/18 on evaluation today patient appears to be doing much better in regard to her left lower Trinity ulcer. She's been tolerating the laptop complication and has minimal drainage noted at this point. Fortunately there's no signs of active infection at this time. No fevers, chills, nausea, or vomiting noted at this time. 11/03/18 on evaluation today patient actually appears to be doing excellent in regard to her left lower extremity. She is having very little drainage at this point there does not appear to be any significant signs of infection overall very pleased with how things have gone. She is likewise extremely pleased still and seems to be making wonderful progress  week to week. I do believe antibiotics were helpful for her. Her primary care provider did place on amateur clean since I last saw her. 11/17/18 on evaluation today patient appears to be doing worse in regard to her bilateral lower extremities at this point. She is been tolerating the dressing changes without complication. With that being said she typically takes the Coban off fairly quickly upon arriving home even after being seen here in the clinic and does not allow home health reapply command as part of the dressing at home. Therefore she said no compression essentially since I last saw her as best I can tell. With that being said I think it shows and how much swelling she has in the open wounds that are noted at this point. Fortunately there's no signs of infection but unfortunately if she doesn't get this under control I think she will end up with infection and more significant issues. 11/24/18 on evaluation today patient actually appears to be doing somewhat better in regard to her bilateral lower extremities. 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 Nonetheless that is doing better currently so she should be able to attempt reinitiating the lymphedema pumps at this time. No fevers, chills, nausea, or vomiting noted at this time. 12/01/18 upon evaluation today patient's left lower extremity appears to be doing quite well unfortunately her right lower extremity is not doing nearly as well. She has been tolerating the dressing changes without complication unfortunately she did not keep a wrap on the right at this time. Nonetheless I believe this has led to increased swelling and weeping in the world is actually much larger than during the last evaluation with her. 12/08/18 on evaluation today patient appears to be doing about the same at this point in  regard to her right lower extremity. There is some more palatable to touch I'm concerned about the possibility of there being some infection although I think the main issue is she's not keeping her compression wrap on which in turn is not allowing this area to heal appropriately. 12/22/18 on evaluation today patient appears to be doing better in regard to left lower extremity unfortunately significantly worse in regard to the right lower extremity. The areas of blistering and necrotic superficial tissue have spread and again this does not really appear to be signs of infection and all she just doesn't seem to be doing nearly as well is what she has been in the past. Overall I feel like the Augmentin did absolutely nothing for her she doesn't seem to have any infection again I really didn't think so last time either is more of a potential preventative measure and hoping that this would make some difference but I think the main issue is she's not wearing her compression. She tells me she cannot wear the [place] been we put on she takes it off pretty much upon getting home. Subsequently she worshiped Juxta-Lite when I questioned her about how often she wears it this is no more than three hours a day obviously that leaves 21 hours that she has no compression and this is obviously not doing well for her. Overall I'm concerned that if things continue to worsen she is at great risk of both infection as well as losing her leg. 01/05/19 on evaluation today patient appears to be doing well in regard to her left lower extremity which he is allowing [place] to wrap and not so well with regard to her right lower extremity which she is not allowing [place] to really wrap and keep the wrap on. She states that it hurts too badly whenever it's wrapped and she ends up having to take it off. She's been using the Juxta-Lite she tells me up to six hours a day although I question whether or not that's really been the case to be  honest. Previously she told me three hours today nonetheless obviously the legs as long as the wrap is doing great when she is not is doing much more poorly. 01/12/2019 on evaluation today patient actually appears to be doing a little better in my opinion with regard to her right lower extremity ulcer. She has a small open area on the left lower extremity unfortunately but again this I think is part of the normal fluctuation of what she is going to have to expect with regard to her legs especially when she is not using her lymphedema pumps on a regular basis. Subsequently based on what I am seeing today I think that she does seem to be doing slightly better with regard to her right lower extremity she did see her primary care provider on Monday they felt she had an infection and placed her on 2 antibiotics. Both Cipro and clindamycin. Subsequently again she seems possibly to be doing a little bit better in regards to the right lower extremity she also tells me however she has been wearing the compression wrap over the past week since I spoke with her as well that is a Kerlix and Coban wrap on the right. No fevers, chills, nausea, vomiting, or diarrhea. 01/19/2019 on evaluation today patient appears to be doing better with regard to her bilateral lower extremities especially the right. I feel like the compression has been beneficial for her which is great news. She  did get a call from her primary care provider on her way here today telling her that she did have methicillin-resistant Staphylococcus aureus and he was calling in a couple new antibiotics for her including a ointment to be applied she tells me 3 times a day. With that being said this sounds like likely to be Bactroban which I think could be applied with each dressing/wrap change but I would not be able to accommodate her applying this 3 times a day. She is in agreement with the least doing this we will add that to her orders  today. 01/26/2019 on evaluation today patient actually appears to be doing much better with regard to her right lower extremity. Her left lower extremity is also doing quite well all things considering. Fortunately there is no evidence of active infection at this time. No fevers, chills, nausea, vomiting, or diarrhea. 02/02/2019 on evaluation today patient appears to be doing much better compared to her last evaluation. Little by little off like her right leg is returning more towards normal. There does not appear to be any signs of active infection and overall she seems to be doing quite well which is great news. I am very pleased in this regard. No fevers, chills, nausea, vomiting, or diarrhea. 02/09/2019 upon evaluation today patient appears to be doing better with regard to her bilateral lower extremities. She has been tolerating the dressing changes without complication. Fortunately there is no signs of active infection at this time. No fevers, chills, nausea, vomiting, or diarrhea. 02/23/2019 on evaluation today patient actually appears to be doing quite well with regard to her bilateral lower extremities. She has been tolerating the dressing changes without complication. She is even used her pumps one time and states that she really felt like it felt good. With that being said she seems to be in good spirits and her legs appear to be doing excellent. 03/09/2019 on evaluation today patient appears to be doing well with regard to her right lower extremity there are no open wounds at this time she is having some discomfort but I feel like this is more neuropathy than anything. With regard to her left lower extremity she had several areas scattered around that she does have some weeping and drainage from but again overall she does not appear to be having any significant issues and no evidence of infection at this time which is good news. 03/23/2019 on evaluation today patient appears to be doing well  with regard to her right lower extremity which she tells me is still close she is using her juxta light here. Her left lower extremity she mainly just has an area on the foot which is still slightly draining although this also is doing great. Overall very pleased at this time. 04/06/2019 patient appears to be doing a little bit worse in regard to her left lower extremity upon evaluation today. She feels like this could be becoming infected again which she had issues with previous. Fortunately there is no signs of systemic infection but again this is always a struggle with her with her legs she will go from doing well to not so well in a very short amount of time. 04/20/2019 on evaluation today patient actually appears to be doing quite well with regard to her right lower extremity I do not see any signs of active infection at this time. Fortunately there is no fever chills noted. She is still taking the antibiotics which I prescribed for her at this point. In  regard to the left lower extremity I do feel like some of these areas are better although again she still is having weeping from several locations at this time. 04/27/2019 on evaluation today patient appears to be doing about the same if not slightly worse in regard to her left lower extremity ulcers. She tells me when questioned that she has been sleeping in her Hoveround chair in fact she tells me she falls asleep without even knowing it. I think she is spending a whole lot of time in the chair and less time walking and moving around which is not good for her legs either. On top of that she is in a seated position which is also the worst position she is not really elevating her legs and she is also not using her lymphedema pumps. All this is good to contribute to worsening of her condition in general. 05/18/2019 on evaluation today patient appears to be doing well with regard to her lower extremity on the right in fact this is showing no signs  of any open wounds at this time. On the left she is continuing to have issues with areas that do drain. Some of the regions have healed and there are couple areas that have reopened. She did go to the ER per the patient according to recommendations from the home health nurse due to what she was seen when she came out on 05/13/2019. Subsequently she felt like the patient needed to go to the hospital due to the fact that again she was having "milky white discharge" from her leg. Nonetheless she had and then was placed on doxycycline and subsequently seems to be doing better. 06/01/2019 upon evaluation today patient appears to be doing really in my opinion about the same. I do not see any signs of active infection which is good news. Overall she still has wounds over the bilateral lower extremities she has reopened on the right but this appears to be more of a crack where there is weeping/edema coming from the region. I do not see any evidence of infection at either site based on what I visualized today. Electronic Signature(s) Signed: 06/01/2019 5:23:51 PM By: Lenda Kelp PA-C Entered By: Lenda Kelp on 06/01/2019 17:23:50 -------------------------------------------------------------------------------- Physical Exam Details Patient Name: Date of Service: [name], [name] 06/01/2019 10:00 AM Medical Record [id number] Patient Account Number: [id number] Date of Birth/Sex: Treating RN: 05-Feb-1951 (68 y.o. [name] Primary Care Provider: Fatima Sanger Other Clinician: Referring Provider: Treating Provider/Extender:Stone III, Flo Shanks, FRED Weeks in Treatment: 95 Constitutional Well-nourished and well-hydrated in no acute distress. Respiratory normal breathing without difficulty. Psychiatric this patient is able to make decisions and demonstrates good insight into disease process. Alert and Oriented x 3. pleasant and cooperative. Notes Patient's wound bed currently  showed signs of good granulation at this time at most locations and several areas were weeping and macerated but really did not appear to have any actual wounds noted. Especially on the right. Nonetheless we will continue to keep an eye on these regions to ensure they do not open up or become any more severe. I did perform some debridement in regard to the left dorsal surface of her foot to clear away some of the thickened/fibrotic tissue. She tolerated that with no complications there was a small wound under this area. Electronic Signature(s) Signed: 06/01/2019 5:24:34 PM By: Lenda Kelp PA-C Entered By: Lenda Kelp on 06/01/2019 17:24:34 -------------------------------------------------------------------------------- Physician Orders Details Patient Name: Date of Service: [name]  [name] 06/01/2019 10:00 AM Medical Record [id number] Patient Account Number: [id number] Date of Birth/Sex: Treating RN: 07/04/1950 (68 y.o. [name] Primary Care Provider: Fatima Sanger Other Clinician: Referring Provider: Treating Provider/Extender:Stone III, Flo Shanks, FRED Weeks in Treatment: [phone] Verbal / Phone Orders: No Diagnosis Coding ICD-10 Coding Code Description E11.622 Type 2 diabetes mellitus with other skin ulcer I89.0 Lymphedema, not elsewhere classified I87.331 Chronic venous hypertension (idiopathic) with ulcer and inflammation of right lower extremity I87.332 Chronic venous hypertension (idiopathic) with ulcer and inflammation of left lower extremity L97.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 leg with fat layer exposed L97.822 Non-pressure chronic ulcer of other part of left lower leg with fat layer exposed I10 Essential (primary) hypertension E66.01 Morbid (severe) obesity due to excess calories F41.8 Other specified anxiety disorders R53.1 Weakness Follow-up Appointments Return appointment in 3 weeks. Dressing Change Frequency Other: -  all wounds - 2 times per week by home health Wound Cleansing Clean wound with Wound Cleanser - all wounds May shower with protection. Primary Wound Dressing Wound #58 Left,Medial Ankle Calcium Alginate with Silver Wound #61 Left,Anterior Lower Leg Calcium Alginate with Silver Wound #62 Left,Proximal,Medial Lower Leg Calcium Alginate with Silver Wound #63 Right,Medial Malleolus Calcium Alginate with Silver Wound #64 Left,Dorsal Foot Calcium Alginate with Silver Secondary Dressing Dry Gauze - all wounds ABD pad - as needed Edema Control 3 Layer Compression System - Bilateral Avoid standing for long periods of time - walking is encouraged Elevate legs to the level of the heart or above for 30 minutes daily and/or when sitting, a frequency of: - do not sleep in chair with feet dangling Exercise regularly Support Garment 20-30 mm/Hg pressure to: - Juxtalite to right lower leg daily Segmental Compressive Device. - lymphedema pumps 60 minutes 1- 2 times per day Additional Orders / Instructions Follow Nutritious Diet - To include vitamin A, vitamin C, and Zinc along with increased protein intake. Home Health Continue Home Health skilled nursing for wound care. - [facility] Electronic Signature(s) Signed: 06/01/2019 5:37:18 PM By: Lenda Kelp PA-C Signed: 06/02/2019 5:32:06 PM By: Zandra Abts RN, BSN Entered By: Zandra Abts on 06/01/2019 10:32:19 -------------------------------------------------------------------------------- Problem List Details Patient Name: Date of Service: [name]. 06/01/2019 10:00 AM Medical Record [id number] Patient Account Number: [id number] Date of Birth/Sex: Treating RN: 1951/03/23 (68 y.o. [name] Primary Care Provider: Fatima Sanger Other Clinician: Referring Provider: Treating Provider/Extender:Stone III, Flo Shanks, FRED Weeks in Treatment: 95 Active Problems ICD-10 Evaluated Encounter Code Description Active Date  Today Diagnosis E11.622 Type 2 diabetes mellitus with other skin ulcer 08/05/2017 No Yes I89.0 Lymphedema, not elsewhere classified 08/05/2017 No Yes I87.331 Chronic venous hypertension (idiopathic) with ulcer 08/05/2017 No Yes and inflammation of right lower extremity I87.332 Chronic venous hypertension (idiopathic) with ulcer 08/05/2017 No Yes and inflammation of left lower extremity L97.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 08/05/2017 No Yes leg with fat layer exposed L97.822 Non-pressure chronic ulcer of other part of left lower 08/05/2017 No Yes leg with fat layer exposed L97.522 Non-pressure chronic ulcer of other part of left foot 06/01/2019 No Yes with fat layer exposed I10 Essential (primary) hypertension 08/05/2017 No Yes E66.01 Morbid (severe) obesity due to excess calories 08/05/2017 No Yes F41.8 Other specified anxiety disorders 08/05/2017 No Yes R53.1 Weakness 08/05/2017 No Yes Inactive Problems Resolved Problems Electronic Signature(s) Signed: 06/01/2019 5:26:26 PM By: Lenda Kelp PA-C Previous Signature: 06/01/2019 10:09:24 AM Version By: Lenda Kelp PA-C Entered By:  Lenda Kelp on 06/01/2019 17:26:26 -------------------------------------------------------------------------------- Progress Note Details Patient Name: Date of Service: [name], [name]. 06/01/2019 10:00 AM Medical Record [id number] Patient Account Number: [id number] Date of Birth/Sex: Treating RN: Oct 20, 1950 (68 y.o. [name] Primary Care Provider: Fatima Sanger Other Clinician: Referring Provider: Treating Provider/Extender:Stone III, Flo Shanks, FRED Weeks in Treatment: 95 Subjective Chief Complaint Information obtained from Patient Bilateral reoccurring LE ulcers History of Present Illness (HPI) this patient has been seen a couple of times before and returns with recurrent problems to her right and left lower extremity with swelling and weeping ulcerations  due to not wearing her compression stockings which she had been advised to do during her last discharge, at the end of June 2018. During her last visit the patient had had normal arterial blood flow and her venous reflux study did not necessitate any surgical intervention. She was recommended compression and elevation and wound care. After prolonged treatment the patient was completely healed but she has been noncompliant with wearing or compressions.. She was here last week with an outpatient return visit planned but the patient came in a very poor general condition with altered mental status and was rushed to the ER on my request. With a history of hypertension, diabetes, TIA and right-sided weakness she was set up for an MRI on her brain and cervical spine and was sent to [facility]. Getting an MRI done was very difficult but once the workup was done she was found not to have any spinal stenosis, epidural abscess or hematoma or discitis. This was radiculopathy to be treated as an outpatient and she was given a follow-up appointment. Today she is feeling much better alert and oriented and has come to reevaluate her bilateral lower extremity lymphedema and ulceration 03/25/2017 -- she was admitted to the hospital on 03/16/2017 and discharged on 03/18/2017 with left leg cellulitis and ulceration. She was started on vancomycin and Zosyn and x-ray showed no bony involvement. She was treated for a cellulitis with IV antibiotics changed to Rocephin and Flagyl and was discharged on oral Keflex and doxycycline to complete a 7 day course. Last hemoglobin A1c was 7.1 and her other ailments including hypertension got asthma were appropriately treated. 05/06/2017 -- she is awaiting the right size of compression stockings from [place] but other than that has been doing well. ====== Old notes 68 year old patient was seen one time last October and was lost to follow-up. She has recurrent problems  with weeping and ulceration of her left lower extremity and has swelling of this for several years. It has been worse for the last 2 months. Past medical history is significant for diabetes mellitus type 2, hypertension, gout, morbid obesity, depressive disorders, hiatal hernia, migraines, status post knee surgery, risk of a cholecystectomy, vaginal hysterectomy and breast biopsy. She is not a smoker. As noted before she has never had a venous duplex study and an arterial ABI study was attempted but the left lower extremity was noncompressible 10/01/2016 -- had a lower extremity venous duplex reflux evaluation which showed no evidence of deep vein reflux in the right or left lower extremity, and no evidence of great saphenous vein reflux more than 500 ms in the right or left lower extremity, and the left small saphenous vein is incompetent but no vascular consult was recommended. review of her electronic medical records noted that the ABI was checked in July 2017 where the right ABI was normal limits and the left ABI could not be ascertained due to  pain with cuff pressure but the waveforms are within normal limits. her arterial duplex study scheduled for April 27. 10/08/2016 -- the patient has various reasons for not having a compression on and for the last 3 days she has had no compression on her left lower extremity either due to pain or the lack of nursing help. She does not use her juxta lites either. 10/15/2016 -- the patient did not keep her appointment for arterial duplex study on April 27 and I have asked her to reschedule this. Her pain is out of proportion with the physical findings and she continuously fails to wear a compression wraps and cuts them off because she says she cannot tolerate the pain. She does not use her juxta lites either. 10/22/2016 -- he has rescheduled her arterial duplex study to May 21 and her pain today is a bit better. She has not been wearing her juxta lites  on her right lower extremity but now understands that she needs to do this. She did tolerate the to press compression wrap on her left lower extremity 10/29/2016 --arterial duplex study is scheduled for next week and overall she has been tolerating her compression wraps and also using her juxta lites on her right lower extremity 11/05/2016 -- the right ABI was 0.95 the left was 1.03. The digit TBI is on the right was 0.83 on the left was 0.92 and she had biphasic flow through these vessels. The impression was that of normal lower extremity arterial study. 11/12/2016 -- her pain is minimal and she is doing very well overall. 11/26/2016 -- she has got juxta lites and her insurance will not pay for additional dual layer compression stockings. She is going to order some from [facility]. 05/12/2017 -- her juxta lites are very old and too big for her and these have not been helping with compression. She did get 20-30 mm compression stockings from  but she and her husband are unable to put these on. I believe she will benefit from bilateral Extremit-ease, compression stockings and we will measure her for these today. 05/20/2017 -- lymphedema on the left lower extremity has increased a lot and she has a open ulceration as a result of this. The right lower extremity is looking pretty good. She has decided to by the compression stockings herself and will get reimbursed by the home health, at a later date. 05/27/2017 -- her sciatica is bothering her a lot and she thought her left leg pain was caused due to the compression wrap and hence removed it and has significant lymphedema. There is no inflammation on this left lower extremity. 06/17/17 on evaluation today patient appears to be doing very well and in fact is completely healed in regard to her ulcerations. Unfortunately however she does have continued issues with lymphedema nonetheless. We did order compression garments for her unfortunately she  states that the size that she received were large although we ordered medium. Obviously this means she is not getting the optimal compression. She does not have those with her today and therefore we could not confirm and contact the company on her behalf. Nonetheless she does state that she is going to have her husband bring them by tomorrow so that we can verify and then get in touch with the company. No fevers, chills, nausea, or vomiting noted at this time. Overall patient is doing better otherwise and I'm pleased with the progress she has made. 07/01/17 on evaluation today patient appears to be doing very well in  regard to her bilateral lower extremity she does not have any openings at this point which is excellent news. Overall I'm pleased with how things have progressed up to this time. Since she is doing so well we did order her compression which we are seeing her today to ensure that it fits her properly and everything is doing well in that regard and then subsequently she will be discharged. ============ Old Notes: 03/31/16 patient presents today for evaluation concerning open wounds that she has over the left medial ankle region as well as the left dorsal foot. She has previously had this occur although it has been healed for a number of months after having this for about a year prior until her hospitalization on 01/05/16. At that point in time it appears that she was admitted to the hospital for left lower extremity cellulitis and was placed on vancomycin and Zosyn at that point. Eventually upon discharge on January 15, 2016 she was placed on doxycycline at that point in time. Later on 03/27/16 positive wound culture growing Escherichia coli this was switched to amoxicillin. Currently she tells me that she is having pain radiated to be a 7 out of 10 which can be as high as 10 out of 10 with palpation and manipulation of the wound. This wound appears to be mainly venous in nature due to the  bilateral lower extremity venous stasis/lymphedema. This is definitely much worse on her left than the right side. She does have type 1 diabetes mellitus, hypertension, morbid obesity, and is wheelchair dependent.during the course of the hospital stay a blood culture was also obtained and fortunately appeared negative. She also had an x-ray of the tibia/fibula on the left which showed no acute bone abnormality. Her white blood cell count which was performed last on 03/25/16 was 7.3, hemoglobin 12.8, protein 7.1, albumin 3.0. Her urine culture appeared to be negative for any specific organisms. Patient did have a left lower extremity venous duplex evaluation for DVT. This did not include venous reflux studies but fortunately was negative for DVT. Patient also had arterial studies performed which revealed that she had a normal ABI on the right though this was unable to be performed on the left secondary to pain that she was having around the ankle region due to the wound. However it was stated on report that she had biphasic pulses and apparently good blood flow. ========== 06/03/17 she is here in follow-up evaluation for right lower extremity ulcer. The right lower sure he has healed but she has reopened to the left medial malleolus and dorsal foot with weeping. She is waiting for new compression garments to arrive from home health, the previous compression garments were ill fitting. We will continue with compression bilaterally and follow-up in 2 weeks Readmission: 08/05/17 on evaluation today patient appears to be doing somewhat poorly in regard to her left lower extremity especially although the right lower extremity has a small area which may no longer be open. She has been having a lot of drainage from the left lower extremity however he tells me that she has not been able to use the EXTREMIT- EASE Compression at this point. She states that she did better and was able to actually apply the  Juxta-Lite compression although the wound that she has is too large and therefore really does not compress which is why she cannot wear it at this point. She has no one who can help her put it on regular basis her son can sometimes  but he's not able to do it most of the time. I do believe that's why she has begun to weave and have issues as she is currently yet again. No fevers, chills, nausea, or vomiting noted at this time. Patient is no evidence of dementia. 08/12/17 on evaluation today patient appears to still be doing fairly well in regard to the draining areas/weeping areas at this point. With that being said she unfortunately did go to the ER yesterday due to what was felt to be possibly a cellulitis. They place her on doxycycline by mouth and discharge her home. She definitely was not admitted. With that being said she states she has had more discomfort which has been unusual for her even compared to prior times and she's had infections.08/12/17 on evaluation today patient appears to still be doing fairly well in regard to the draining areas/weeping areas at this point. With that being said she unfortunately did go to the ER yesterday due to what was felt to be possibly a cellulitis. They place her on doxycycline by mouth and discharge her home. She definitely was not admitted. With that being said she states she has had more discomfort which has been unusual for her even compared to prior times and she's had infections. 08/19/17 put evaluation today patient tells me that she's been having a lot of what sounds to be neuropathic type pain in regard to her left lower extremity. She has been using over-the-counter topical bins again which some believe. That in order to apply the she actually remove the wrap we put on her last Wednesday on Thursday. Subsequently she has not had anything on compression wise since that time. The good news is a lot of the weeping areas appear to have closed at this  point again I believe she would do better with compression but we are struggling to get her to actually use what she needs to at this point. No fevers, chills, nausea, or vomiting noted at this time. 09/03/17 on evaluation today patient appears to be doing okay in regard to her lower extremities in regard to the lymphedema and weeping. Fortunately she does not seem to show any signs of infection at this point she does have a little bit of weeping occurring in the right medial malleolus area. With that being said this does not appear to be too significant which is good news. 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 She has severe skin damage secondary to both of these features involving the dorsal left foot and medial left ankle and lower leg. Still has open areas in the left anterior foot. The area on the right closed over. She uses her own juxta light stockings. She does not have an arterial issue 09/16/17 on evaluation today patient actually appears to be doing excellent in regard to her bilateral lower extremity swelling. The Juxta-Lite compression wrap seem to be doing very well for her. She has not however been using the portion that goes over her foot. Her left foot still is draining a little bit not nearly as significant as it has been in the past but still I do believe that she likely needs to utilize the full wrap including the foot portion of this will improve as well. She also has been apparently putting on a significant amount of Vaseline which also think is not helpful for her. I recommended that if she feels she needs something for moisturizer Eucerin will probably be  better. 09/30/17 on evaluation today patient presents with several new open areas in regard to her left lower extremity although these appear to be minimal and mainly seem to be more moisture breakdown than anything. Fortunately she does not seem to have  any evidence of infection which is great news. She has been tolerating the dressing changes without complication we are using silver alginate on the foot she has been using AB pads to have the legs and using her Juxta-Lite compression which seems to be controlling her swelling very well. Overall I'm pleased with the poor way she has progressed. 10/14/17 on evaluation today patient appears to be doing better in regard to her left lower extremity areas of weeping. She does still have some discomfort although in general this does not appear to be as macerated and I think it is progressing nicely. I do think she still needs to wear the foot portion of her Juxta-Lite in order to get the most benefit from the wrap obviously. She states she understands. Fortunately there does not appear to be evidence of infection at this time which is great news. 10/28/17 on evaluation today patient appears to be doing excellent in regard to her left lower extremity. She has just a couple areas that are still open and seem to be causing any trouble whatsoever. For that reason I think that she is definitely headed in the right direction the spots are very tiny compared to what we have been dealing with in the past. 11/11/17 on evaluation today patient appears to have a right lateral lower extremity ulcer that has opened since I last saw her. She states this is where the home health nurse that was coming out remove the dressing without wetting the alginate first. Nonetheless I do not know if this is indeed the case or not but more importantly we have not ordered home help to be coming out for her wounds at all. I'm unsure as to why they are coming out and we're gonna have to check on this and get things situated in that regard. With that being said we currently really do not need them to be coming out as the patient has been taking care of her leg herself without complication and no issues. In fact she was doing much better  prior to nursing coming out. 11/25/17 on evaluation today patient actually appears to be doing fairly well in regard to her left lower extremity swelling. In fact she has very little area of weeping at this point there's just a small spot on the lateral portion of her right leg that still has me just a little bit more concerned as far as wanting to see this clear up before I discharge her to caring for this at home. Nonetheless overall she has made excellent progress. 12/09/17 on evaluation today patient appears to be doing rather well in regard to her lower extremity edema. She does have some weeping still in the left lower extremity although the big area we were taking care of two weeks ago actually has closed and she has another area of weeping on the left lower extremity immediately as well is the top of her foot. She does not currently have lymphedema pumps she has been wearing her compression daily on a regular basis as directed. With that being said I think she may benefit from lymphedema pumps. She has been wearing the compression on a regular basis since I've been seeing her back in February 2019 through now and despite  this she still continues to have issues with stage III lymphedema. We had a very difficult time getting and keeping this under control. 12/23/17 on evaluation today patient actually appears to be doing a little bit more poorly in regard to her bilateral lower extremities. She has been tolerating the Juxta-Lite compression wraps. Unfortunately she has two new ulcers on the right lower extremity and left lower [place] ulceration seems to be larger. Obviously this is not good news. She has been tolerating the dressings without complication. 12/30/17 on evaluation today patient actually appears to be doing much better in regard to her bilateral lower extremity edema. She continues to have some issues with ulcerations and in fact there appears to be one spot on each leg where the  wrap may have caused a little bit of a blister which is subsequently opened up at this point is given her pain. Fortunately it does not appear to be any evidence of infection which is good news. No fevers chills noted. 01/13/18 on evaluation today patient appears to be doing rather well in regard to her bilateral lower extremities. The dressings did get kind of stuck as far as the wound beds are concerned but again I think this is mainly due to the fact that she actually seems to be showing signs of healing which is good news. She's not having as much drainage therefore she was having more of the dressing sticking. Nonetheless overall I feel like her swelling is dramatically down compared to previous. 01/20/18 on evaluation today patient unfortunately though she's doing better in most regards has a large blister on the left anterior lower extremity where she is draining quite significantly. Subsequently this is going to need debridement today in order to see what's underneath and ensure she does not continue to trapping fluid at this location. Nonetheless No fevers, chills, nausea, or vomiting noted at this time. 01/27/18 on evaluation today patient appears to be doing rather well at this point in regard to her right lower extremity there's just a very small area that she still has open at this point. With that being said I do believe that she is tolerating the compression wraps very well in making good progress. Home health is coming out at this point to see her. Her left lower extremity on the lateral portion is actually what still mainly open and causing her some discomfort for the most part 02/10/18 on evaluation today patient actually appears to be doing very well in regard to her right lower extremity were all the ulcers appear to be completely close. In regard to the left lower extremity she does have two areas still open and some leaking from the dorsal surface of her foot but this still seems  to be doing much better to me in general. 02/24/18 on evaluation today patient actually appears to be doing much better in regard to her right lower extremity this is still completely healed. Her left lower extremity is also doing much better fortunately she has no evidence of infection. The one area that is gonna require some debridement is still on the left anterior shin. Fortunately this is not hurting her as badly today. 03/10/18 on evaluation today patient appears to be doing better in some regards although she has a little bit more open area on the dorsal foot and she also has some issues on the medial portion of the left lower extremity which is actually new and somewhat deep. With that being said there fortunately does not appear  to be any significant signs of infection which is good news. No fevers, chills, nausea, or vomiting noted at this time. In general her swelling seems to be doing fairly well which is good news. 03/31/18 on evaluation today patient presents for follow-up concerning her left lower extremity lymphedema. Unfortunately she has been doing a little bit more poorly since I last saw her in regard to the amount of weeping that she is experiencing. She's also having some increased pain in the anterior shin location. Unfortunately I do not feel like the patient is making such good progress at this point a few weeks back she was definitely doing much better. 04/07/18 on evaluation today patient actually appears to be showing some signs of improvement as far as the left lower extremity is concerned. She has been tolerating the dressing changes and it does appear that the Drawtex did better for her. With that being said unfortunately home health is stating that they cannot obtain the Drawtex going forward. Nonetheless we're gonna have to check and see what they may be able to get the alginate they were using was getting stuck in causing new areas of skin being pulled all that with  and subsequently weep and calls her to worsen overall this is the first time we've seen improvement at this time. 04/14/18 on evaluation today patient actually appears to be doing rather well at this point there does not appear to be any evidence of infection at this time and she is actually doing excellent in regard to the weeping in fact she almost has no openings remaining even compared to just last week this is a dramatic improvement. No fevers chills noted 04/21/18 evaluation today patient actually appears to be doing very well. She in fact is has a small area on the posterior lower extremity location and she has a small area on the dorsal surface of her foot that are still open both of which are very close to closing. We're hoping this will be close shortly. She brought her Juxta-Lite wrap with her today hoping that would be able to put her in it unfortunately I don't think were quite at that point yet but we're getting closer. 04/28/18 upon evaluation today patient actually appears to be doing excellent in regard to her left lower extremity ulcer. In fact the region on the posterior lower extremity actually is much smaller than previously noted. Overall I'm very happy with the progress she has made. She again did bring her Juxta-Lite although we're not quite ready for that yet. 05/11/18 upon evaluation today patient actually appears to be doing in general fairly well in regard to her left lower Trinity. The swelling is very well controlled. With that being said she has a new area on the left anterior lower extremity as well as between the first and second toes of her left foot that was not present during the last evaluation. The region of her posterior left lower extremity actually appears to be almost completely healed. To be honest I'm very pleased with the way that stands. Nonetheless I do believe that the lotion may be keeping the area to moist as far as her legs are concerned  subsequently I'm gonna consider discontinuing that today. 05/26/18 on evaluation today patient appears to be doing rather well in regard to her left lower should be ulcers. In fact everything appears to be close except for a very small area on the left posterior lower extremity. Fortunately there does not appear to be any  evidence of infection at this time. Overall very pleased with her progress. 06/02/18 and evaluation today patient actually appears to be doing very well in regard to her lower extremity ulcers. She has one small area that still continues to weep that I think may benefit her being able to justify lotion and user Juxta-Lite wraps versus continued to wrap her. Nonetheless I think this is something we can definitely look into at this point. 06/23/18 on evaluation today patient unfortunately has openings of her bilateral lower extremities. In general she seems to be doing much worse than when I last saw her just as far as her overall health standpoint is concerned. She states that her discomfort is mainly due to neuropathy she's not having any other issues otherwise. No fevers, chills, nausea, or vomiting noted at this time. 06/30/18 on evaluation today patient actually appears to be doing a little worse in regard to her right lower extremity her left lower extremity of doing fairly well. Fortunately there is no sign of infection at this time. She has been tolerating the dressing changes without complication. Home health did not come out like they were supposed to for the appropriate wrap changes. They stated that they never received the orders from [place] which were fax. Nonetheless we will send a copy of the orders with the patient today as well. 07/07/18 on evaluation today patient appears to be doing much better in regard to lower extremities. She still has several openings bilaterally although since I last saw her her legs did show obvious signs of infection when she later saw her nurse.  Subsequently a culture was obtained and she is been placed on Bactrim and Keflex. Fortunately things seem to be looking much better it does appear she likely had an infection. Again last week we'd even discussed it but again there really was not any obvious sign that she had infection therefore we held off on the antibiotics. Nonetheless I'm glad she's doing better today. 07/14/18 on evaluation today patient appears to be doing much better regarding her bilateral lower Trinity's. In fact on the right lower for me there's nothing open at this point there are some dry skin areas at the sites where she had infection. Fortunately there is no evidence of systemic infection which is excellent news. No fevers chills noted 07/21/18 on evaluation today patient actually appears to be doing much better in regard to her left lower extremity ulcers. She is making good progress and overall I feel like she's improving each time I see her. She's having no pain I do feel like the infection is completely resolved which is excellent news. No fevers, chills, nausea, or vomiting noted at this time. 07/28/18 on evaluation today patient appears to be doing very well in regard to her left lower [employer]. Everything seems to be showing signs of improvement which is excellent news. Overall very pleased with the progress that has been made. Fortunately there's no evidence of active infection at this time also excellent news. 08/04/18 on evaluation today patient appears to be doing more poorly in regard to her bilateral lower extremities. She has two new areas open up on the right and these were completely closed as of last week. She still has the two spots on the left which in my pinion seem to be doing better. Fortunately there's no evidence of infection again at this point. 08/11/18 on evaluation today patient actually appears to be doing very well in regard to her bilateral lower Trinity wounds  that all seem to be doing  better and are measures smaller today. Fortunately there's no signs of infection. No fevers, chills, nausea, or vomiting noted at this time. 08/18/18 on evaluation today patient actually appears to be doing about the same inverter bilateral lower extremities. She continues to have areas that blistering open as was drain that fortunately nothing too significant. Overall I feel like Drawtex may have done better for her however compared to the collagen. 08/25/18 on evaluation today patient appears to be doing a little bit more poorly today even compared to last time I saw her. Again I'm not exactly sure why she's making worse progress over the past several weeks. I'm beginning to wonder if there is some kind of underlying low level infection causing this issue. I did actually take a culture from the left anterior lower extremity but it was a new wound draining quite a bit at this point. Unfortunately she also seems to be having more pain which is what also makes me worried about the possibility of infection. This is despite never erythema noted at this point. 09/01/18 on evaluation today patient actually appears to be doing a little worse even compared to last week in regard to bilateral lower extremities. She did go to the hospital on the 16th was given a dose of IV Zosyn and then discharged with a recommendation to continue with the Bactrim that I previously prescribed for her. Nonetheless she is still having a lot of discomfort she tells me as well at this time. This is definitely unfortunate. No fevers, chills, nausea, or vomiting noted at this time. 09/08/18 on evaluation today patient's bilateral lower extremities actually appear to be shown signs of improvement which is good news. Fortunately there does not appear to be any signs of active infection I think the anabiotic is helping in this regard. Overall I'm very pleased with how she is progressing. 09/15/18 patient was actually seen in ER yesterday  due to her legs as well unfortunately. She states she's been having a lot of pain and discomfort as well as a lot of drainage. Upon inspection today the patient does have a lot of swelling and drainage I feel like this is more related to lymphedema and poor fluid control than it is to infection based on what I'm seeing. The physician in the emergency department also doubted that the patient was having a significant infection nonetheless I see no evidence of infection obvious at this point although I do see evidence of poor fluid control. She still not using a compression pumps, she is not elevating due to her lift chair as well as her hospital bed being broken, and she really is not keeping her legs up as much as they should be and also has been taking off her wraps. All this combined I think has led to poor fluid control and to be honest she may be somewhat volume overloaded in general as well. I recommend that she may need to contact your physician to see if a prescription for a diuretic would be beneficial in their opinion. As long as this is safe I think it would likely help her. 09/29/18 on evaluation today patient's left lower extremity actually appears to be doing quite a bit better. At least compared to last time that I saw her. She still has a large area where she is draining from but there's a lot of new skin speckled trout and in fact there's more new skin that there are open areas  of weeping and drainage at this point. This is good news. With regard to the right lower extremity this is doing much better with the only open area that I really see being a dry spot on the right lateral ankle currently. Fortunately there's no signs of active infection at this time which is good news. No fevers, chills, nausea, or vomiting noted at this time. The patient seems somewhat stressed and overwhelmed during the visit today she was very lethargic as such. She does and she is not taking any pain  medications at this point. Apparently according to her husband are also in the process of moving which is probably taking its toll on her as well. 10/06/18 on evaluation today patient appears to be doing rather well in regard to her lower extremities compared to last evaluation. Fortunately there's no signs of active infection. She tells me she did have an appointment with her primary. Nonetheless he was concerned that the wounds were somewhat deep based on pictures but we never actually saw her legs. She states that he had her somewhat worried due to the fact that she was fearing now that she was [place] have to have an amputation. With that being said based on what I'm seeing check she looks better this week that she has the last two times I've seen her with much less drainage I'm actually pleased in this regard. That doesn't mean that she's out of the water but again I do not think what the point of talking about education at all in regard to her leg. She is very happy to hear this. She is also not having as much pain as she was having last week. 10/13/18 unfortunately on evaluation today patient still continues to have a significant amount of drainage she's not letting home health actually apply the compression dressings at this point. She's trying to use of Juxta-Lite of the top of Kerlex and the second layer of the three layer compression wrap. With that being said she just does not seem to be making as good a progress as I would expect if she was having the compression applied and in place on a regular basis. No fevers, chills, nausea, or vomiting noted at this time. 10/20/18 on evaluation today patient appears to be doing a little better in regard to her bilateral lower extremity ulcers. In fact the right lower extremity seems to be healed she doesn't even have any openings at this point left lower extremity though still somewhat macerated seems to be showing signs of new skin growth at multiple  locations throughout. Fortunately there's no evidence of active infection at this time. No fevers, chills, nausea, or vomiting noted at this time. 10/27/18 on evaluation today patient appears to be doing much better in regard to her left lower Trinity ulcer. She's been tolerating the laptop complication and has minimal drainage noted at this point. Fortunately there's no signs of active infection at this time. No fevers, chills, nausea, or vomiting noted at this time. 11/03/18 on evaluation today patient actually appears to be doing excellent in regard to her left lower extremity. She is having very little drainage at this point there does not appear to be any significant signs of infection overall very pleased with how things have gone. She is likewise extremely pleased still and seems to be making wonderful progress week to week. I do believe antibiotics were helpful for her. Her primary care provider did place on amateur clean since I last saw her. 11/17/18  on evaluation today patient appears to be doing worse in regard to her bilateral lower extremities at this point. She is been tolerating the dressing changes without complication. With that being said she typically takes the Coban off fairly quickly upon arriving home even after being seen here in the clinic and does not allow home health reapply command as part of the dressing at home. Therefore she said no compression essentially since I last saw her as best I can tell. With that being said I think it shows and how much swelling she has in the open wounds that are noted at this point. Fortunately there's no signs of infection but unfortunately if she doesn't get this under control I think she will end up with infection and more significant issues. 11/24/18 on evaluation today patient actually appears to be doing somewhat better in regard to her bilateral lower extremities. She still tells me she has not been using her compression pumps she  tells me the reason is that she had gout of her right great toe and listen to much pain to do this over the past week. Nonetheless that is doing better currently so she should be able to attempt reinitiating the lymphedema pumps at this time. No fevers, chills, nausea, or vomiting noted at this time. 12/01/18 upon evaluation today patient's left lower extremity appears to be doing quite well unfortunately her right lower extremity is not doing nearly as well. She has been tolerating the dressing changes without complication unfortunately she did not keep a wrap on the right at this time. Nonetheless I believe this has led to increased swelling and weeping in the world is actually much larger than during the last evaluation with her. 12/08/18 on evaluation today patient appears to be doing about the same at this point in regard to her right lower extremity. There is some more palatable to touch I'm concerned about the possibility of there being some infection although I think the main issue is she's not keeping her compression wrap on which in turn is not allowing this area to heal appropriately. 12/22/18 on evaluation today patient appears to be doing better in regard to left lower extremity unfortunately significantly worse in regard to the right lower extremity. The areas of blistering and necrotic superficial tissue have spread and again this does not really appear to be signs of infection and all she just doesn't seem to be doing nearly as well is what she has been in the past. Overall I feel like the Augmentin did absolutely nothing for her she doesn't seem to have any infection again I really didn't think so last time either is more of a potential preventative measure and hoping that this would make some difference but I think the main issue is she's not wearing her compression. She tells me she cannot wear the [place] been we put on she takes it off pretty much upon getting home. Subsequently  she worshiped Juxta-Lite when I questioned her about how often she wears it this is no more than three hours a day obviously that leaves 21 hours that she has no compression and this is obviously not doing well for her. Overall I'm concerned that if things continue to worsen she is at great risk of both infection as well as losing her leg. 01/05/19 on evaluation today patient appears to be doing well in regard to her left lower extremity which he is allowing [place] to wrap and not so well with regard to her right  lower extremity which she is not allowing [place] to really wrap and keep the wrap on. She states that it hurts too badly whenever it's wrapped and she ends up having to take it off. She's been using the Juxta-Lite she tells me up to six hours a day although I question whether or not that's really been the case to be honest. Previously she told me three hours today nonetheless obviously the legs as long as the wrap is doing great when she is not is doing much more poorly. 01/12/2019 on evaluation today patient actually appears to be doing a little better in my opinion with regard to her right lower extremity ulcer. She has a small open area on the left lower extremity unfortunately but again this I think is part of the normal fluctuation of what she is going to have to expect with regard to her legs especially when she is not using her lymphedema pumps on a regular basis. Subsequently based on what I am seeing today I think that she does seem to be doing slightly better with regard to her right lower extremity she did see her primary care provider on Monday they felt she had an infection and placed her on 2 antibiotics. Both Cipro and clindamycin. Subsequently again she seems possibly to be doing a little bit better in regards to the right lower extremity she also tells me however she has been wearing the compression wrap over the past week since I spoke with her as well that is a Kerlix and Coban  wrap on the right. No fevers, chills, nausea, vomiting, or diarrhea. 01/19/2019 on evaluation today patient appears to be doing better with regard to her bilateral lower extremities especially the right. I feel like the compression has been beneficial for her which is great news. She did get a call from her primary care provider on her way here today telling her that she did have methicillin-resistant Staphylococcus aureus and he was calling in a couple new antibiotics for her including a ointment to be applied she tells me 3 times a day. With that being said this sounds like likely to be Bactroban which I think could be applied with each dressing/wrap change but I would not be able to accommodate her applying this 3 times a day. She is in agreement with the least doing this we will add that to her orders today. 01/26/2019 on evaluation today patient actually appears to be doing much better with regard to her right lower extremity. Her left lower extremity is also doing quite well all things considering. Fortunately there is no evidence of active infection at this time. No fevers, chills, nausea, vomiting, or diarrhea. 02/02/2019 on evaluation today patient appears to be doing much better compared to her last evaluation. Little by little off like her right leg is returning more towards normal. There does not appear to be any signs of active infection and overall she seems to be doing quite well which is great news. I am very pleased in this regard. No fevers, chills, nausea, vomiting, or diarrhea. 02/09/2019 upon evaluation today patient appears to be doing better with regard to her bilateral lower extremities. She has been tolerating the dressing changes without complication. Fortunately there is no signs of active infection at this time. No fevers, chills, nausea, vomiting, or diarrhea. 02/23/2019 on evaluation today patient actually appears to be doing quite well with regard to her bilateral  lower extremities. She has been tolerating the dressing changes without complication.  She is even used her pumps one time and states that she really felt like it felt good. With that being said she seems to be in good spirits and her legs appear to be doing excellent. 03/09/2019 on evaluation today patient appears to be doing well with regard to her right lower extremity there are no open wounds at this time she is having some discomfort but I feel like this is more neuropathy than anything. With regard to her left lower extremity she had several areas scattered around that she does have some weeping and drainage from but again overall she does not appear to be having any significant issues and no evidence of infection at this time which is good news. 03/23/2019 on evaluation today patient appears to be doing well with regard to her right lower extremity which she tells me is still close she is using her juxta light here. Her left lower extremity she mainly just has an area on the foot which is still slightly draining although this also is doing great. Overall very pleased at this time. 04/06/2019 patient appears to be doing a little bit worse in regard to her left lower extremity upon evaluation today. She feels like this could be becoming infected again which she had issues with previous. Fortunately there is no signs of systemic infection but again this is always a struggle with her with her legs she will go from doing well to not so well in a very short amount of time. 04/20/2019 on evaluation today patient actually appears to be doing quite well with regard to her right lower extremity I do not see any signs of active infection at this time. Fortunately there is no fever chills noted. She is still taking the antibiotics which I prescribed for her at this point. In regard to the left lower extremity I do feel like some of these areas are better although again she still is having weeping from  several locations at this time. 04/27/2019 on evaluation today patient appears to be doing about the same if not slightly worse in regard to her left lower extremity ulcers. She tells me when questioned that she has been sleeping in her Hoveround chair in fact she tells me she falls asleep without even knowing it. I think she is spending a whole lot of time in the chair and less time walking and moving around which is not good for her legs either. On top of that she is in a seated position which is also the worst position she is not really elevating her legs and she is also not using her lymphedema pumps. All this is good to contribute to worsening of her condition in general. 05/18/2019 on evaluation today patient appears to be doing well with regard to her lower extremity on the right in fact this is showing no signs of any open wounds at this time. On the left she is continuing to have issues with areas that do drain. Some of the regions have healed and there are couple areas that have reopened. She did go to the ER per the patient according to recommendations from the home health nurse due to what she was seen when she came out on 05/13/2019. Subsequently she felt like the patient needed to go to the hospital due to the fact that again she was having "milky white discharge" from her leg. Nonetheless she had and then was placed on doxycycline and subsequently seems to be doing better. 06/01/2019 upon evaluation today  patient appears to be doing really in my opinion about the same. I do not see any signs of active infection which is good news. Overall she still has wounds over the bilateral lower extremities she has reopened on the right but this appears to be more of a crack where there is weeping/edema coming from the region. I do not see any evidence of infection at either site based on what I visualized today. Patient History Information obtained from Patient. Family History Cancer -  Siblings, Hypertension - Siblings, Stroke - Father, No family history of Diabetes, Heart Disease, Hereditary Spherocytosis, Kidney Disease, Lung Disease, Seizures, Thyroid Problems, Tuberculosis. Social History Never smoker, Marital Status - Married, Alcohol Use - Never, Drug Use - No History, Caffeine Use - Never. Medical History Eyes Denies history of Cataracts, Glaucoma, Optic Neuritis Ear/Nose/Mouth/Throat Denies history of Chronic sinus problems/congestion, Middle ear problems Hematologic/Lymphatic Denies history of Anemia, Hemophilia, Human Immunodeficiency Virus, Lymphedema, Sickle Cell Disease Respiratory Patient has history of Asthma Denies history of Aspiration, Chronic Obstructive Pulmonary Disease (COPD), Pneumothorax, Sleep Apnea, Tuberculosis Cardiovascular Patient has history of Hypertension, Peripheral Arterial Disease, Peripheral Venous Disease - chronic Denies history of Angina, Arrhythmia, Congestive Heart Failure, Coronary Artery Disease, Deep Vein Thrombosis, Hypotension, Myocardial Infarction, Phlebitis, Vasculitis Endocrine Patient has history of Type II Diabetes Genitourinary Denies history of End Stage Renal Disease Immunological Denies history of Lupus Erythematosus, Raynaudoos, Scleroderma Musculoskeletal Patient has history of Gout, Osteoarthritis Denies history of Rheumatoid Arthritis, Osteomyelitis Neurologic Denies history of Dementia, Neuropathy, Quadriplegia, Paraplegia, Seizure Disorder Psychiatric Denies history of Anorexia/bulimia, Confinement Anxiety Hospitalization/Surgery History - leg cellulitis. Medical And Surgical History Notes Constitutional Symptoms (General Health) morbid obesity, wheelchair dependent Integumentary (Skin) LLL cellulitis , wound cx grew E. Coli Psychiatric insomnia , adjustment disorder, depression with anxiety Review of Systems (ROS) Constitutional Symptoms (General Health) Denies complaints or symptoms of  Fatigue, Fever, Chills, Marked Weight Change. Respiratory Denies complaints or symptoms of Chronic or frequent coughs, Shortness of Breath. Psychiatric Denies complaints or symptoms of Claustrophobia, Suicidal. Objective Constitutional Well-nourished and well-hydrated in no acute distress. Vitals Time Taken: 9:22 AM, Height: 62 in, Weight: 335 lbs, BMI: 61.3, Temperature: 98.4 F, Pulse: 103 bpm, Respiratory Rate: 19 breaths/min, Blood Pressure: 146/79 mmHg, Capillary Blood Glucose: 131 mg/dl. General Notes: patient stated last CBG was 131 Respiratory normal breathing without difficulty. Psychiatric this patient is able to make decisions and demonstrates good insight into disease process. Alert and Oriented x 3. pleasant and cooperative. General Notes: Patient's wound bed currently showed signs of good granulation at this time at most locations and several areas were weeping and macerated but really did not appear to have any actual wounds noted. Especially on the right. Nonetheless we will continue to keep an eye on these regions to ensure they do not open up or become any more severe. I did perform some debridement in regard to the left dorsal surface of her foot to clear away some of the thickened/fibrotic tissue. She tolerated that with no complications there was a small wound under this area. Integumentary (Hair, Skin) Wound #58 status is Open. Original cause of wound was Blister. The wound is located on the Left,Medial Ankle. The wound measures 0.2cm length x 1cm width x 0.1cm depth; 0.157cm^2 area and 0.016cm^3 volume. There is Fat Layer (Subcutaneous Tissue) Exposed exposed. There is no tunneling or undermining noted. There is a small amount of serosanguineous drainage noted. The wound margin is distinct with the outline attached to the wound base. There is  large (67-100%) pink granulation within the wound bed. There is a small (1-33%) amount of necrotic tissue within the wound  bed including Adherent Slough. Wound #61 status is Open. Original cause of wound was Gradually Appeared. The wound is located on the Left,Anterior Lower Leg. The wound measures 0.8cm length x 0.8cm width x 0.1cm depth; 0.503cm^2 area and 0.05cm^3 volume. There is Fat Layer (Subcutaneous Tissue) Exposed exposed. There is no tunneling or undermining noted. There is a small amount of serous drainage noted. The wound margin is flat and intact. There is large (67-100%) pink granulation within the wound bed. There is no necrotic tissue within the wound bed. Wound #62 status is Open. Original cause of wound was Gradually Appeared. The wound is located on the Left,Proximal,Medial Lower Leg. The wound measures 1.1cm length x 1.5cm width x 0.1cm depth; 1.296cm^2 area and 0.13cm^3 volume. There is Fat Layer (Subcutaneous Tissue) Exposed exposed. There is no tunneling or undermining noted. There is a medium amount of serous drainage noted. The wound margin is distinct with the outline attached to the wound base. There is large (67-100%) red, pink granulation within the wound bed. There is no necrotic tissue within the wound bed. Wound #63 status is Open. Original cause of wound was Gradually Appeared. The wound is located on the Right,Medial Malleolus. The wound measures 0.4cm length x 5.2cm width x 0.1cm depth; 1.634cm^2 area and 0.163cm^3 volume. There is Fat Layer (Subcutaneous Tissue) Exposed exposed. There is no tunneling or undermining noted. There is a small amount of serous drainage noted. The wound margin is well defined and not attached to the wound base. There is small (1-33%) pale granulation within the wound bed. There is a large (67- 100%) amount of necrotic tissue within the wound bed including Eschar and Adherent Slough. Wound #64 status is Open. Original cause of wound was Gradually Appeared. The wound is located on the Left,Dorsal Foot. The wound measures 0.7cm length x 0.3cm width x 0.3cm  depth; 0.165cm^2 area and 0.049cm^3 volume. There is Fat Layer (Subcutaneous Tissue) Exposed exposed. There is no tunneling or undermining noted. There is a small amount of serosanguineous drainage noted. The wound margin is flat and intact. There is large (67- 100%) pink granulation within the wound bed. There is no necrotic tissue within the wound bed. 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 There was a Selective/Open Wound Skin/Epidermis Debridement with a total area of 9 sq cm performed by Lenda Kelp, PA. With the following instrument(s): Curette to remove Non-Viable tissue/material. Material removed includes Callus, Skin: Epidermis, and Biofilm. No specimens were taken. A time out was conducted at 10:25, prior to the start of the procedure. A Minimum amount of bleeding was controlled with N/A. The procedure was tolerated well with a pain level of 0 throughout and  a pain level of 0 following the procedure. Post Debridement Measurements: 0.7cm length x 0.3cm width x 0.3cm depth; 0.049cm^3 volume. Character of Wound/Ulcer Post Debridement is improved. Severity of Tissue  Post Debridement is: Fat layer exposed. Post procedure Diagnosis Wound #64: Same as Pre-Procedure Pre-procedure diagnosis of Wound #64 is a Diabetic Wound/Ulcer of the Lower Extremity located on the Left,Dorsal Foot . There was a Three Layer Compression Therapy Procedure by Zandra Abts, RN. Post procedure Diagnosis Wound #64: Same as Pre-Procedure Wound #58 Pre-procedure diagnosis of Wound #58 is a Diabetic Wound/Ulcer of the Lower Extremity located on the Left,Medial Ankle . There was a Three Layer Compression Therapy Procedure by Zandra Abts, RN. Post procedure Diagnosis Wound #58: Same as Pre-Procedure Wound #61 Pre-procedure diagnosis of Wound #61 is a Venous Leg Ulcer located on the Left,Anterior Lower Leg . There was a Three Layer Compression Therapy Procedure by Zandra Abts, RN. Post procedure Diagnosis Wound #61: Same as Pre-Procedure Wound #62 Pre-procedure diagnosis of Wound #62 is a Diabetic Wound/Ulcer of the Lower Extremity located on the Left,Proximal,Medial Lower Leg . There was a Three Layer Compression Therapy Procedure by Zandra Abts, RN. Post procedure Diagnosis Wound #62: Same as Pre-Procedure Wound #63 Pre-procedure diagnosis of Wound #63 is a Venous Leg Ulcer located on the Right,Medial Malleolus . There was a Three Layer Compression Therapy Procedure by Zandra Abts, RN. Post procedure Diagnosis Wound #63: Same as Pre-Procedure Plan Follow-up Appointments: Return appointment in 3 weeks. Dressing Change Frequency: Other: - all wounds - 2 times per week by home health Wound Cleansing: Clean wound with Wound Cleanser - all wounds May shower with protection. Primary Wound Dressing: Wound #58 Left,Medial Ankle: Calcium Alginate with Silver Wound #61 Left,Anterior Lower Leg: Calcium Alginate with Silver Wound #62 Left,Proximal,Medial Lower Leg: Calcium Alginate with Silver Wound #63 Right,Medial Malleolus: Calcium Alginate with Silver Wound #64  Left,Dorsal Foot: Calcium Alginate with Silver Secondary Dressing: Dry Gauze - all wounds ABD pad - as needed Edema Control: 3 Layer Compression System - Bilateral Avoid standing for long periods of time - walking is encouraged Elevate legs to the level of the heart or above for 30 minutes daily and/or when sitting, a frequency of: - do not sleep in chair with feet dangling Exercise regularly Support Garment 20-30 mm/Hg pressure to: - Juxtalite to right lower leg daily Segmental Compressive Device. - lymphedema pumps 60 minutes 1- 2 times per day Additional Orders / Instructions: Follow Nutritious Diet - To include vitamin A, vitamin C, and Zinc along with increased protein intake. Home Health: Continue Home Health skilled nursing for wound care. - [facility]. My suggestion currently is good to be that we continue with silver alginate dressings to all wound locations she seems to do well with this. Though she does have wounds that recur some areas will heal and then others show up. Nonetheless she is at least able to heal. 2. I do recommend she continue to wear her compression and she should elevate her legs as well as much as possible. 3. I really do think she is to be wearing and using her lymphedema pumps 2 times a day ideally I am not even sure she is doing this daily however. We will see patient back for reevaluation in 2 weeks here in the clinic. If anything worsens or changes patient will contact our office for additional recommendations. Electronic Signature(s) Signed: 06/01/2019 5:26:44 PM By: Lenda Kelp PA-C Previous Signature: 06/01/2019 5:25:23 PM Version By: Lenda Kelp  PA-C Entered By: Lenda Kelp on 06/01/2019 17:26:43 -------------------------------------------------------------------------------- HxROS Details Patient Name: Date of Service: [name], [name] 06/01/2019 10:00 AM Medical Record [id number] Patient Account Number: [id number] Date of  Birth/Sex: Treating RN: [birth year] (68 y.o. [name] Primary Care Provider: Fatima Sanger Other Clinician: Referring Provider: Treating Provider/Extender:Stone III, Flo Shanks, FRED Weeks in Treatment: 95 Information Obtained From Patient Constitutional Symptoms (General Health) Complaints and Symptoms: Negative for: Fatigue; Fever; Chills; Marked Weight Change Medical History: Past Medical History Notes: morbid obesity, wheelchair dependent Respiratory Complaints and Symptoms: Negative for: Chronic or frequent coughs; Shortness of Breath Medical History: Positive for: Asthma Negative for: Aspiration; Chronic Obstructive Pulmonary Disease (COPD); Pneumothorax; Sleep Apnea; Tuberculosis Psychiatric Complaints and Symptoms: Negative for: Claustrophobia; Suicidal Medical History: Negative for: Anorexia/bulimia; Confinement Anxiety Past Medical History Notes: insomnia , adjustment disorder, depression with anxiety Eyes Medical History: Negative for: Cataracts; Glaucoma; Optic Neuritis Ear/Nose/Mouth/Throat Medical History: Negative for: Chronic sinus problems/congestion; Middle ear problems Hematologic/Lymphatic Medical History: Negative for: Anemia; Hemophilia; Human Immunodeficiency Virus; Lymphedema; Sickle Cell Disease Cardiovascular Medical History: Positive for: Hypertension; Peripheral Arterial Disease; Peripheral Venous Disease - chronic Negative for: Angina; Arrhythmia; Congestive Heart Failure; Coronary Artery Disease; Deep Vein Thrombosis; Hypotension; Myocardial Infarction; Phlebitis; Vasculitis Endocrine Medical History: Positive for: Type II Diabetes Time with diabetes: 2005 Treated with: Insulin Blood sugar tested every day: No Blood sugar testing results: Breakfast: 155; Lunch: 155; Dinner: 160; Bedtime: 155 Genitourinary Medical History: Negative for: End Stage Renal Disease Immunological Medical History: Negative for: Lupus Erythematosus;  Raynauds; Scleroderma Integumentary (Skin) Medical History: Past Medical History Notes: LLL cellulitis , wound cx grew E. Coli Musculoskeletal Medical History: Positive for: Gout; Osteoarthritis Negative for: Rheumatoid Arthritis; Osteomyelitis Neurologic Medical History: Negative for: Dementia; Neuropathy; Quadriplegia; Paraplegia; Seizure Disorder Immunizations Pneumococcal Vaccine: Received Pneumococcal Vaccination: Yes Implantable Devices No devices added Hospitalization / Surgery History Type of Hospitalization/Surgery leg cellulitis Family and Social History Cancer: Yes - Siblings; Diabetes: No; Heart Disease: No; Hereditary Spherocytosis: No; Hypertension: Yes - Siblings; Kidney Disease: No; Lung Disease: No; Seizures: No; Stroke: Yes - Father; Thyroid Problems: No; Tuberculosis: No; Never smoker; Marital Status - Married; Alcohol Use: Never; Drug Use: No History; Caffeine Use: Never; Financial Concerns: No; Food, Clothing or Shelter Needs: No; Support System Lacking: No; Transportation Concerns: No Physician Affirmation I have reviewed and agree with the above information. Electronic Signature(s) Signed: 06/01/2019 5:37:18 PM By: Lenda Kelp PA-C Signed: 06/02/2019 5:32:06 PM By: Zandra Abts RN, BSN Entered By: Lenda Kelp on 06/01/2019 17:24:05 -------------------------------------------------------------------------------- SuperBill Details Patient Name: Date of Service: [name] 06/01/2019 Medical Record [id number] Patient Account Number: [id number] Date of Birth/Sex: Treating RN: 1951/05/23 (68 y.o. [name] Primary Care Provider: Fatima Sanger Other Clinician: Referring Provider: Treating Provider/Extender:Stone III, Flo Shanks, FRED Weeks in Treatment: 95 Diagnosis Coding ICD-10 Codes Code Description E11.622 Type 2 diabetes mellitus with other skin ulcer I89.0 Lymphedema, not elsewhere classified I87.331 Chronic venous  hypertension (idiopathic) with ulcer and inflammation of right lower extremity I87.332 Chronic venous hypertension (idiopathic) with ulcer and inflammation of left lower extremity L97.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 leg with fat layer exposed L97.822 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 I10 Essential (primary) hypertension E66.01 Morbid (severe) obesity due to excess calories F41.8 Other specified anxiety disorders R53.1 Weakness Facility Procedures CPT4 Code Description: [id number] - DEBRIDE  WOUND 1ST 20 SQ CM OR < ICD-10 Diagnosis Description L97.522 Non-pressure chronic ulcer of other part of left foot with Modifier: fat layer exp Quantity: 1 osed Physician Procedures CPT4 Code Description: [id number] - WC PHYS DEBR WO ANESTH 20 SQ CM ICD-10 Diagnosis Description L97.522 Non-pressure chronic ulcer of other part of left foot with Modifier: fat layer expo Quantity: 1 sed Electronic Signature(s) Signed: 06/01/2019 5:27:05 PM By: Lenda Kelp PA-C Entered By: Lenda Kelp on 06/01/2019 17:26:59

## 2019-06-02 NOTE — Progress Notes (Signed)
ADAMA, IVINS ([id number]) Visit Report for 06/01/2019 Arrival Information Details Patient Name: Date of Service: [name], [name] 06/01/2019 10:00 AM Medical Record [id number] Patient Account Number: [id number] Date of Birth/Sex: Treating RN: [birth year] (68 y.o. [name] Primary Care Tiaria Biby: [name] Other Clinician: Referring Waynette Towers: Treating Saher Davee/Extender:Stone III, [name], FRED Weeks in Treatment: 95 Visit Information History Since Last Visit Added or deleted any medications: No Patient Arrived: Wheel Chair Any new allergies or adverse reactions: No Arrival Time: 09:19 Had a fall or experienced change in No activities of daily living that may affect Accompanied By: self risk of falls: Transfer Assistance: None Signs or symptoms of abuse/neglect since last No Patient Identification Verified: Yes visito Secondary Verification Process Yes Hospitalized since last visit: No Completed: Implantable device outside of the clinic excluding No Patient Requires Transmission-Based No cellular tissue based products placed in the center Precautions: since last visit: Patient Has Alerts: Yes Has Dressing in Place as Prescribed: Yes Patient Alerts: R ABI= Has Compression in Place as Prescribed: Yes 1.01 Pain Present Now: No L ABI = .99 Electronic Signature(s) Signed: 06/02/2019 5:33:40 PM By: Kela Millin Entered By: Kela Millin on 06/01/2019 09:22:26 -------------------------------------------------------------------------------- Compression Therapy Details Patient Name: Date of Service: [name]. 06/01/2019 10:00 AM Medical Record [id number] Patient Account Number: [id number] Date of Birth/Sex: Treating RN: 15-Jul-1950 (68 y.o. [name] Primary Care Mikia Delaluz: [name] Other Clinician: Referring Luciann Gossett: Treating Doron Shake/Extender:Stone III, [name], FRED Weeks in Treatment: 95 Compression  Therapy Performed for Wound Wound #58 Left,Medial Ankle Assessment: Performed By: Clinician Levan Hurst, RN Compression Type: Three Layer Post Procedure Diagnosis Same as Pre-procedure Electronic Signature(s) Signed: 06/02/2019 5:32:06 PM By: Levan Hurst RN, BSN Entered By: Levan Hurst on 06/01/2019 10:33:09 -------------------------------------------------------------------------------- Compression Therapy Details Patient Name: Date of Service: [name]. 06/01/2019 10:00 AM Medical Record [id number] Patient Account Number: [id number] Date of Birth/Sex: Treating RN: 1951-04-05 (68 y.o. [name] Primary Care Jannelly Bergren: [name] Other Clinician: Referring Amoura Ransier: Treating Ocean Kearley/Extender:Stone III, [name], FRED Weeks in Treatment: 95 Compression Therapy Performed for Wound Wound #61 Left,Anterior Lower Leg Assessment: Performed By: Clinician Levan Hurst, RN Compression Type: Three Layer Post Procedure Diagnosis Same as Pre-procedure Electronic Signature(s) Signed: 06/02/2019 5:32:06 PM By: Levan Hurst RN, BSN Entered By: Levan Hurst on 06/01/2019 10:33:09 -------------------------------------------------------------------------------- Compression Therapy Details Patient Name: Date of Service: [name]. 06/01/2019 10:00 AM Medical Record [id number] Patient Account Number: [id number] Date of Birth/Sex: Treating RN: [birth year] (68 y.o. [name] Primary Care Yasmin Dibello: [name] Other Clinician: Referring Alaria Oconnor: Treating Ladasha Schnackenberg/Extender:Stone III, [name], FRED Weeks in Treatment: 95 Compression Therapy Performed for Wound Wound #62 Left,Proximal,Medial Lower Leg Assessment: Performed By: Clinician Levan Hurst, RN Compression Type: Three Layer Post Procedure Diagnosis Same as Pre-procedure Electronic Signature(s) Signed: 06/02/2019 5:32:06 PM By: Levan Hurst RN, BSN Entered By:  Levan Hurst on 06/01/2019 10:33:09 -------------------------------------------------------------------------------- Compression Therapy Details Patient Name: Date of Service: [name]. 06/01/2019 10:00 AM Medical Record [id number] Patient Account Number: [id number] Date of Birth/Sex: Treating RN: 1951/01/23 (68 y.o. [name] Primary Care Per Beagley: [name] Other Clinician: Referring Seanmichael Salmons: Treating Brooksie Ellwanger/Extender:Stone III, [name], FRED Weeks in Treatment: 95 Compression Therapy Performed for Wound Wound #64 Left,Dorsal Foot Assessment: Performed By: Clinician Levan Hurst, RN Compression Type: Three Layer Post Procedure Diagnosis Same as Pre-procedure Electronic Signature(s) Signed: 06/02/2019 5:32:06 PM By: Levan Hurst RN, BSN Entered By: Levan Hurst on 06/01/2019 10:33:09 -------------------------------------------------------------------------------- Compression Therapy Details Patient Name: Date of Service:  [name], [name] 06/01/2019 10:00 AM Medical Record [id number] Patient Account Number: [id number] Date of Birth/Sex: Treating RN: [birth year] (68 y.o. [name] Primary Care Clodagh Odenthal: [name] Other Clinician: Referring Siera Beyersdorf: Treating Marielis Samara/Extender:Stone III, [name], FRED Weeks in Treatment: 95 Compression Therapy Performed for Wound Wound #63 Right,Medial Malleolus Assessment: Performed By: Clinician Levan Hurst, RN Compression Type: Three Layer Post Procedure Diagnosis Same as Pre-procedure Electronic Signature(s) Signed: 06/02/2019 5:32:06 PM By: Levan Hurst RN, BSN Entered By: Levan Hurst on 06/01/2019 10:33:09 -------------------------------------------------------------------------------- Encounter Discharge Information Details Patient Name: Date of Service: [name]. 06/01/2019 10:00 AM Medical Record [id number] Patient Account Number: [id number] Date of  Birth/Sex: Treating RN: [birth year] (68 y.o. [name] Primary Care Jammie Troup: [name] Other Clinician: Referring Shaia Porath: Treating Laurren Lepkowski/Extender:Stone III, [name], FRED Weeks in Treatment: 95 Encounter Discharge Information Items Post Procedure Vitals Discharge Condition: Stable Temperature (F): 98.4 Ambulatory Status: Wheelchair Pulse (bpm): 103 Discharge Destination: Home Respiratory Rate (breaths/min): 19 Transportation: Other Blood Pressure (mmHg): 146/79 Accompanied By: self Schedule Follow-up Appointment: Yes Clinical Summary of Care: Patient Declined Electronic Signature(s) Signed: 06/02/2019 9:23:41 AM By: Carlene Coria RN Entered By: Carlene Coria on 06/01/2019 10:45:05 -------------------------------------------------------------------------------- Lower Extremity Assessment Details Patient Name: Date of Service: [name], [name] 06/01/2019 10:00 AM Medical Record [id number] Patient Account Number: [id number] Date of Birth/Sex: Treating RN: 09/16/1950 (68 y.o. [name] Primary Care Grayson Pfefferle: [name] Other Clinician: Referring Romney Compean: Treating Louana Fontenot/Extender:Stone III, [name], FRED Weeks in Treatment: 95 Edema Assessment Assessed: [Left: No] [Right: No] Edema: [Left: Yes] [Right: Yes] Calf Left: Right: Point of Measurement: 37 cm From Medial Instep 40 cm 41.5 cm Ankle Left: Right: Point of Measurement: 10 cm From Medial Instep 27.5 cm 24.5 cm Vascular Assessment Pulses: Dorsalis Pedis Palpable: [Left:Yes] [Right:Yes] Electronic Signature(s) Signed: 06/02/2019 5:33:40 PM By: Kela Millin Entered By: Kela Millin on 06/01/2019 09:26:18 -------------------------------------------------------------------------------- Multi-Disciplinary Care Plan Details Patient Name: Date of Service: [name]. 06/01/2019 10:00 AM Medical Record [id number] Patient Account Number: [id number] Date of  Birth/Sex: Treating RN: 10-21-1950 (68 y.o. [name] Primary Care Onyekachi Gathright: [name] Other Clinician: Referring Dewitt Judice: Treating Temisha Murley/Extender:Stone III, [name], FRED Weeks in Treatment: 95 Active Inactive Venous Leg Ulcer Nursing Diagnoses: Actual venous Insuffiency (use after diagnosis is confirmed) Knowledge deficit related to disease process and management Goals: Patient will maintain optimal edema control Date Initiated: 08/12/2017 Target Resolution Date: 06/15/2019 Goal Status: Active Patient/caregiver will verbalize understanding of disease process and disease management Date Initiated: 08/12/2017 Date Inactivated: 04/21/2018 Target Resolution Date: 04/24/2018 Goal Status: Met Interventions: Assess peripheral edema status every visit. Compression as ordered Treatment Activities: Therapeutic compression applied : 08/12/2017 Notes: Wound/Skin Impairment Nursing Diagnoses: Impaired tissue integrity Knowledge deficit related to ulceration/compromised skin integrity Goals: Patient/caregiver will verbalize understanding of skin care regimen Date Initiated: 08/12/2017 Target Resolution Date: 06/15/2019 Goal Status: Active Ulcer/skin breakdown will have a volume reduction of 30% by week 4 Date Initiated: 08/05/2017 Date Inactivated: 09/30/2017 Target Resolution Date: 10/03/2017 Goal Status: Met Ulcer/skin breakdown will have a volume reduction of 50% by week 8 Date Initiated: 09/30/2017 Date Inactivated: 10/28/2017 Target Resolution Date: 10/28/2017 Goal Status: Met Interventions: Assess patient/caregiver ability to perform ulcer/skin care regimen upon admission and as needed Assess ulceration(s) every visit Provide education on ulcer and skin care Screen for HBO Treatment Activities: Patient referred to home care : 08/05/2017 Skin care regimen initiated : 08/05/2017 Topical wound management initiated : 08/05/2017 Notes: Electronic Signature(s) Signed:  06/02/2019 5:32:06 PM By: Levan Hurst  RN, BSN Entered By: Levan Hurst on 06/01/2019 09:25:47 -------------------------------------------------------------------------------- Pain Assessment Details Patient Name: Date of Service: TANIJA, GERMANI 06/01/2019 10:00 AM Medical Record [id number] Patient Account Number: [id number] Date of Birth/Sex: Treating RN: Jan 06, 1951 (68 y.o. [name] Primary Care Dalores Weger: [name] Other Clinician: Referring Romelia Bromell: Treating Rayelynn Loyal/Extender:Stone III, [name], FRED Weeks in Treatment: 95 Active Problems Location of Pain Severity and Description of Pain Patient Has Paino No Site Locations Pain Management and Medication Current Pain Management: Electronic Signature(s) Signed: 06/02/2019 5:33:40 PM By: Kela Millin Entered By: Kela Millin on 06/01/2019 09:23:27 -------------------------------------------------------------------------------- Patient/Caregiver Education Details Patient Name: [name] 12/16/2020andnbsp10:00 Date of Service: AM Medical Record [id number] Number: Patient Account Number: [id number] Treating RN: 28-Nov-1950 (68 y.o. Levan Hurst Date of Birth/Gender: F) Other Clinician: Primary Care Treating SMITH, FRED [name] Physician: Physician/Extender: Referring Physician: Wilhelmenia Blase in Treatment: 33 Education Assessment Education Provided To: Patient Education Topics Provided Wound/Skin Impairment: Methods: Explain/Verbal Responses: State content correctly Electronic Signature(s) Signed: 06/02/2019 5:32:06 PM By: Levan Hurst RN, BSN Entered By: Levan Hurst on 06/01/2019 09:26:18 -------------------------------------------------------------------------------- Wound Assessment Details Patient Name: Date of Service: [name]. 06/01/2019 10:00 AM Medical Record [id number] Patient Account Number: [id number] Date of  Birth/Sex: Treating RN: 10-09-1950 (68 y.o. [name] Primary Care Tavi Gaughran: [name] Other Clinician: Referring Edouard Gikas: Treating Gussie Towson/Extender:Stone III, [name], FRED Weeks in Treatment: 95 Wound Status Wound Number: 58 Primary Diabetic Wound/Ulcer of the Lower Extremity Etiology: Wound Location: Left Ankle - Medial Wound Open Wounding Event: Blister Status: Date Acquired: 04/04/2019 Comorbid Asthma, Hypertension, Peripheral Arterial Weeks Of Treatment: 8 History: Disease, Peripheral Venous Disease, Type II Clustered Wound: No Diabetes, Gout, Osteoarthritis Wound Measurements Length: (cm) 0.2 % Reduction i Width: (cm) 1 % Reduction i Depth: (cm) 0.1 Epithelializa Area: (cm) 0.157 Tunneling: Volume: (cm) 0.016 Undermining: Wound Description Classification: Grade 2 Foul Odor Af Wound Margin: Distinct, outline attached Slough/Fibri Exudate Amount: Small Exudate Type: Serosanguineous Exudate Color: red, brown Wound Bed Granulation Amount: Large (67-100%) Granulation Quality: Pink Fascia Expos Necrotic Amount: Small (1-33%) Fat Layer (S Necrotic Quality: Adherent Slough Tendon Expos Muscle Expos Joint Expose Bone Exposed ter Cleansing: No no Yes Exposed Structure ed: No ubcutaneous Tissue) Exposed: Yes ed: No ed: No d: No : No n Area: 97.2% n Volume: 97.2% tion: Small (1-33%) No No Treatment Notes Wound #58 (Left, Medial Ankle) 1. Cleanse With Wound Cleanser Soap and water 3. Primary Dressing Applied Calcium Alginate Ag 4. Secondary Dressing ABD Pad Dry Gauze 6. Support Layer Applied 3 layer compression wrap Notes netting Electronic Signature(s) Signed: 06/02/2019 5:33:40 PM By: Kela Millin Entered By: Kela Millin on 06/01/2019 09:35:30 -------------------------------------------------------------------------------- Wound Assessment Details Patient Name: Date of Service: [name]. 06/01/2019 10:00  AM Medical Record [id number] Patient Account Number: [id number] Date of Birth/Sex: Treating RN: 01-Jul-1950 (68 y.o. [name] Primary Care Chau Sawin: [name] Other Clinician: Referring Estaban [place]: Treating Angie Hogg/Extender:Stone III, [name], FRED Weeks in Treatment: 95 Wound Status Wound Number: 61 Primary Venous Leg Ulcer Etiology: Wound Location: Left Lower Leg - Anterior Wound Open Wounding Event: Gradually Appeared Status: Date Acquired: 04/20/2019 Comorbid Asthma, Hypertension, Peripheral Arterial Weeks Of Treatment: 6 History: Disease, Peripheral Venous Disease, Type II Clustered Wound: No Diabetes, Gout, Osteoarthritis Wound Measurements Length: (cm) 0.8 % Reduct Width: (cm) 0.8 % Reduct Depth: (cm) 0.1 Epitheli Area: (cm) 0.503 Tunneli Volume: (cm) 0.05 Undermi Wound Description Classification: Full Thickness Without Exposed Support Structures Wound Flat and Intact Margin: Exudate Small  Amount: Exudate Serous Type: Exudate amber Color: Wound Bed Granulation Amount: Large (67-100%) Granulation Quality: Pink Necrotic Amount: None Present (0%) Treatment Notes Wound #61 (Left, Anterior Lower Leg) 1. Cleanse With Wound Cleanser Soap and water 3. Primary Dressing Applied Calcium Alginate Ag 4. Secondary Dressing ABD Pad Dry Gauze 6. Support Layer Applied 3 layer compression wrap Foul Odor After Cleansing: No Slough/Fibrino No Exposed Structure Fascia Exposed: No Fat Layer (Subcutaneous Tissue) Exposed: Yes Tendon Exposed: No Muscle Exposed: No Joint Exposed: No Bone Exposed: No ion in Area: 93.3% ion in Volume: 93.4% alization: None ng: No ning: No Notes netting Electronic Signature(s) Signed: 06/02/2019 5:33:40 PM By: Kela Millin Entered By: Kela Millin on 06/01/2019 09:35:57 -------------------------------------------------------------------------------- Wound Assessment Details Patient Name: Date  of Service: [name]. 06/01/2019 10:00 AM Medical Record [id number] Patient Account Number: [id number] Date of Birth/Sex: Treating RN: 1950-08-17 (68 y.o. [name] Primary Care Lelia Jons: [name] Other Clinician: Referring Marceil Welp: Treating Tyjuan Demetro/Extender:Stone III, [name], FRED Weeks in Treatment: 95 Wound Status Wound Number: 62 Primary Diabetic Wound/Ulcer of the Lower Etiology: Extremity Wound Location: Left Lower Leg - Medial, Proximal Secondary Lymphedema Wounding Event: Gradually Appeared Etiology: Date Acquired: 05/12/2019 Wound Open Weeks Of Treatment: 2 Status: Clustered Wound: No Comorbid Asthma, Hypertension, Peripheral Arterial History: Disease, Peripheral Venous Disease, Type II Diabetes, Gout, Osteoarthritis Wound Measurements Length: (cm) 1.1 Width: (cm) 1.5 Depth: (cm) 0.1 Area: (cm) 1.296 Volume: (cm) 0.13 Wound Description Classification: Grade 2 Wound Margin: Distinct, outline attached Exudate Amount: Medium Exudate Type: Serous Exudate Color: amber Wound Bed Granulation Amount: Large (67-100%) Granulation Quality: Red, Pink Necrotic Amount: None Present (0%) r After Cleansing: No ibrino No Exposed Structure osed: No (Subcutaneous Tissue) Exposed: Yes osed: No osed: No sed: No ed: No % Reduction in Area: -129.4% % Reduction in Volume: -128.1% Epithelialization: Small (1-33%) Tunneling: No Undermining: No Foul Odo Slough/F Fascia Exp Fat Layer Tendon Exp Muscle Exp Joint Expo Bone Expos Treatment Notes Wound #62 (Left, Proximal, Medial Lower Leg) 1. Cleanse With Wound Cleanser Soap and water 3. Primary Dressing Applied Calcium Alginate Ag 4. Secondary Dressing ABD Pad Dry Gauze 6. Support Layer Applied 3 layer compression wrap Notes netting Electronic Signature(s) Signed: 06/02/2019 5:33:40 PM By: Kela Millin Entered By: Kela Millin on 06/01/2019  09:36:21 -------------------------------------------------------------------------------- Wound Assessment Details Patient Name: Date of Service: [name]. 06/01/2019 10:00 AM Medical Record [id number] Patient Account Number: [id number] Date of Birth/Sex: Treating RN: [birth year] (68 y.o. [name] Primary Care Avanna Sowder: [name] Other Clinician: Referring Ilir Mahrt: Treating Priscillia Fouch/Extender:Stone III, [name], FRED Weeks in Treatment: 95 Wound Status Wound Number: 63 Primary Venous Leg Ulcer Etiology: Wound Location: Left Malleolus - Medial Wound Open Wounding Event: Gradually Appeared Status: Date Acquired: 05/17/2019 Comorbid Asthma, Hypertension, Peripheral Arterial Weeks Of Treatment: 0 History: Disease, Peripheral Venous Disease, Type II Clustered Wound: No Diabetes, Gout, Osteoarthritis Wound Measurements Length: (cm) 0.4 % Reduction Width: (cm) 5.2 % Reduction Depth: (cm) 0.1 Epitheliali Area: (cm) 1.634 Tunneling: Volume: (cm) 0.163 Undermi Wound Description Full Thickness Without Exposed Support Foul Od Classification: Structures Slough/ Wound Well defined, not attached Margin: Exudate Small Amount: Exudate Serous Type: Exudate amber Color: Wound Bed Granulation Amount: Small (1-33%) Granulation Quality: Pale Fascia Necrotic Amount: Large (67-100%) Fat Lay Necrotic Quality: Eschar, Adherent Slough Tendon Muscle Joint E Bone Ex or After Cleansing: No Fibrino Yes Exposed Structure Exposed: No er (Subcutaneous Tissue) Exposed: Yes Exposed: No Exposed: No xposed: No posed: No in Area: in Volume: zation: None  No ning: No Electronic Signature(s) Signed: 06/02/2019 5:33:40 PM By: Kela Millin Entered By: Kela Millin on 06/01/2019 09:34:24 -------------------------------------------------------------------------------- Wound Assessment Details Patient Name: Date of Service: [name].  06/01/2019 10:00 AM Medical Record [id number] Patient Account Number: [id number] Date of Birth/Sex: Treating RN: 1951-05-28 (68 y.o. [name] Primary Care Jahfari Ambers: [name] Other Clinician: Referring Cordarius Benning: Treating Emerita Berkemeier/Extender:Stone III, [name], FRED Weeks in Treatment: 95 Wound Status Wound Number: 64 Primary Diabetic Wound/Ulcer of the Lower Extremity Etiology: Wound Location: Left Foot - Dorsal Wound Open Wounding Event: Gradually Appeared Status: Date Acquired: 06/01/2019 Comorbid Asthma, Hypertension, Peripheral Arterial Weeks Of Treatment: 0 History: Disease, Peripheral Venous Disease, Type II Clustered Wound: No Diabetes, Gout, Osteoarthritis Wound Measurements Length: (cm) 0.7 Width: (cm) 0.3 Depth: (cm) 0.3 Area: (cm) 0.165 Volume: (cm) 0.049 Wound Description Classification: Grade 1 Wound Margin: Flat and Intact Exudate Amount: Small Exudate Type: Serosanguineous Exudate Color: red, brown Wound Bed Granulation Amount: Large (67-100%) Granulation Quality: Pink Necrotic Amount: None Present (0%) Foul Odor After Cleansing: No Slough/Fibrino No Exposed Structure Fascia Exposed: No Fat Layer (Subcutaneous Tissue) Exposed: Yes Tendon Exposed: No Muscle Exposed: No Joint Exposed: No Bone Exposed: No % Reduction in Area: % Reduction in Volume: Epithelialization: None Tunneling: No Undermining: No Treatment Notes Wound #64 (Left, Dorsal Foot) 1. Cleanse With Wound Cleanser Soap and water 3. Primary Dressing Applied Calcium Alginate Ag 4. Secondary Dressing ABD Pad Dry Gauze 6. Support Layer Applied 3 layer compression wrap Notes netting Electronic Signature(s) Signed: 06/02/2019 5:32:06 PM By: Levan Hurst RN, BSN Entered By: Levan Hurst on 06/01/2019 10:28:10 -------------------------------------------------------------------------------- Vitals Details Patient Name: Date of Service: [name].  06/01/2019 10:00 AM Medical Record [id number] Patient Account Number: [id number] Date of Birth/Sex: Treating RN: 16-Feb-1951 (68 y.o. [name] Primary Care Markayla Reichart: [name] Other Clinician: Referring Britten Parady: Treating Wesam Gearhart/Extender:Stone III, [name], FRED Weeks in Treatment: 95 Vital Signs Time Taken: 09:22 Temperature (F): 98.4 Height (in): 62 Pulse (bpm): 103 Weight (lbs): 335 Respiratory Rate (breaths/min): 19 Body Mass Index (BMI): 61.3 Blood Pressure (mmHg): 146/79 Capillary Blood Glucose (mg/dl): 131 Reference Range: 80 - 120 mg / dl Notes patient stated last CBG was 131 Electronic Signature(s) Signed: 06/02/2019 5:33:40 PM By: Kela Millin Entered By: Kela Millin on 06/01/2019 09:23:21

## 2019-06-14 ENCOUNTER — Ambulatory Visit: Payer: Medicare PPO | Attending: Internal Medicine

## 2019-06-14 DIAGNOSIS — Z20822 Contact with and (suspected) exposure to covid-19: Secondary | ICD-10-CM

## 2019-06-15 LAB — NOVEL CORONAVIRUS, NAA: SARS-CoV-2, NAA: DETECTED — AB

## 2019-06-21 ENCOUNTER — Inpatient Hospital Stay (HOSPITAL_COMMUNITY)
Admission: EM | Admit: 2019-06-21 | Discharge: 2019-06-29 | DRG: 177 | Disposition: A | Discharge status: Home / Self Care | Payer: Medicare PPO | Attending: Internal Medicine | Admitting: Internal Medicine

## 2019-06-21 ENCOUNTER — Emergency Department (HOSPITAL_COMMUNITY): Payer: Medicare PPO

## 2019-06-21 ENCOUNTER — Encounter (HOSPITAL_COMMUNITY): Payer: Self-pay | Admitting: Internal Medicine

## 2019-06-21 ENCOUNTER — Inpatient Hospital Stay (HOSPITAL_COMMUNITY): Payer: Medicare PPO

## 2019-06-21 ENCOUNTER — Other Ambulatory Visit: Payer: Self-pay

## 2019-06-21 DIAGNOSIS — T380X5A Adverse effect of glucocorticoids and synthetic analogues, initial encounter: Secondary | ICD-10-CM | POA: Diagnosis present

## 2019-06-21 DIAGNOSIS — E1151 Type 2 diabetes mellitus with diabetic peripheral angiopathy without gangrene: Secondary | ICD-10-CM | POA: Diagnosis present

## 2019-06-21 DIAGNOSIS — M545 Low back pain: Secondary | ICD-10-CM | POA: Diagnosis present

## 2019-06-21 DIAGNOSIS — M109 Gout, unspecified: Secondary | ICD-10-CM | POA: Diagnosis present

## 2019-06-21 DIAGNOSIS — E785 Hyperlipidemia, unspecified: Secondary | ICD-10-CM | POA: Diagnosis present

## 2019-06-21 DIAGNOSIS — E11649 Type 2 diabetes mellitus with hypoglycemia without coma: Secondary | ICD-10-CM | POA: Diagnosis present

## 2019-06-21 DIAGNOSIS — I872 Venous insufficiency (chronic) (peripheral): Secondary | ICD-10-CM | POA: Diagnosis present

## 2019-06-21 DIAGNOSIS — F4322 Adjustment disorder with anxiety: Secondary | ICD-10-CM | POA: Diagnosis present

## 2019-06-21 DIAGNOSIS — Z823 Family history of stroke: Secondary | ICD-10-CM

## 2019-06-21 DIAGNOSIS — I10 Essential (primary) hypertension: Secondary | ICD-10-CM

## 2019-06-21 DIAGNOSIS — Z7982 Long term (current) use of aspirin: Secondary | ICD-10-CM

## 2019-06-21 DIAGNOSIS — E1165 Type 2 diabetes mellitus with hyperglycemia: Secondary | ICD-10-CM | POA: Diagnosis present

## 2019-06-21 DIAGNOSIS — E7800 Pure hypercholesterolemia, unspecified: Secondary | ICD-10-CM | POA: Diagnosis not present

## 2019-06-21 DIAGNOSIS — F4320 Adjustment disorder, unspecified: Secondary | ICD-10-CM | POA: Diagnosis present

## 2019-06-21 DIAGNOSIS — G8929 Other chronic pain: Secondary | ICD-10-CM | POA: Diagnosis present

## 2019-06-21 DIAGNOSIS — F4329 Adjustment disorder with other symptoms: Secondary | ICD-10-CM | POA: Diagnosis not present

## 2019-06-21 DIAGNOSIS — Z6841 Body Mass Index (BMI) 40.0 and over, adult: Secondary | ICD-10-CM | POA: Diagnosis not present

## 2019-06-21 DIAGNOSIS — Y92239 Unspecified place in hospital as the place of occurrence of the external cause: Secondary | ICD-10-CM | POA: Diagnosis present

## 2019-06-21 DIAGNOSIS — R0902 Hypoxemia: Secondary | ICD-10-CM

## 2019-06-21 DIAGNOSIS — F32A Depression, unspecified: Secondary | ICD-10-CM | POA: Diagnosis present

## 2019-06-21 DIAGNOSIS — E876 Hypokalemia: Secondary | ICD-10-CM | POA: Diagnosis present

## 2019-06-21 DIAGNOSIS — Z885 Allergy status to narcotic agent status: Secondary | ICD-10-CM

## 2019-06-21 DIAGNOSIS — E118 Type 2 diabetes mellitus with unspecified complications: Secondary | ICD-10-CM

## 2019-06-21 DIAGNOSIS — F329 Major depressive disorder, single episode, unspecified: Secondary | ICD-10-CM | POA: Diagnosis present

## 2019-06-21 DIAGNOSIS — Z888 Allergy status to other drugs, medicaments and biological substances status: Secondary | ICD-10-CM | POA: Diagnosis not present

## 2019-06-21 DIAGNOSIS — Z9049 Acquired absence of other specified parts of digestive tract: Secondary | ICD-10-CM

## 2019-06-21 DIAGNOSIS — E1140 Type 2 diabetes mellitus with diabetic neuropathy, unspecified: Secondary | ICD-10-CM | POA: Diagnosis present

## 2019-06-21 DIAGNOSIS — U071 COVID-19: Secondary | ICD-10-CM | POA: Diagnosis present

## 2019-06-21 DIAGNOSIS — E119 Type 2 diabetes mellitus without complications: Secondary | ICD-10-CM | POA: Diagnosis present

## 2019-06-21 DIAGNOSIS — Z79899 Other long term (current) drug therapy: Secondary | ICD-10-CM

## 2019-06-21 DIAGNOSIS — J45909 Unspecified asthma, uncomplicated: Secondary | ICD-10-CM | POA: Diagnosis present

## 2019-06-21 DIAGNOSIS — Z8719 Personal history of other diseases of the digestive system: Secondary | ICD-10-CM | POA: Diagnosis not present

## 2019-06-21 DIAGNOSIS — Z90710 Acquired absence of both cervix and uterus: Secondary | ICD-10-CM

## 2019-06-21 DIAGNOSIS — J1282 Pneumonia due to coronavirus disease 2019: Secondary | ICD-10-CM | POA: Diagnosis present

## 2019-06-21 DIAGNOSIS — F419 Anxiety disorder, unspecified: Secondary | ICD-10-CM | POA: Diagnosis present

## 2019-06-21 DIAGNOSIS — J9601 Acute respiratory failure with hypoxia: Secondary | ICD-10-CM | POA: Diagnosis present

## 2019-06-21 DIAGNOSIS — Z8673 Personal history of transient ischemic attack (TIA), and cerebral infarction without residual deficits: Secondary | ICD-10-CM

## 2019-06-21 DIAGNOSIS — E875 Hyperkalemia: Secondary | ICD-10-CM | POA: Diagnosis not present

## 2019-06-21 DIAGNOSIS — L97909 Non-pressure chronic ulcer of unspecified part of unspecified lower leg with unspecified severity: Secondary | ICD-10-CM | POA: Diagnosis not present

## 2019-06-21 DIAGNOSIS — E782 Mixed hyperlipidemia: Secondary | ICD-10-CM | POA: Diagnosis present

## 2019-06-21 DIAGNOSIS — E669 Obesity, unspecified: Secondary | ICD-10-CM | POA: Diagnosis present

## 2019-06-21 DIAGNOSIS — M1990 Unspecified osteoarthritis, unspecified site: Secondary | ICD-10-CM | POA: Diagnosis present

## 2019-06-21 DIAGNOSIS — Z794 Long term (current) use of insulin: Secondary | ICD-10-CM

## 2019-06-21 DIAGNOSIS — E43 Unspecified severe protein-calorie malnutrition: Secondary | ICD-10-CM | POA: Diagnosis present

## 2019-06-21 DIAGNOSIS — I2721 Secondary pulmonary arterial hypertension: Secondary | ICD-10-CM | POA: Diagnosis present

## 2019-06-21 DIAGNOSIS — IMO0002 Reserved for concepts with insufficient information to code with codable children: Secondary | ICD-10-CM | POA: Diagnosis present

## 2019-06-21 LAB — CBC WITH DIFFERENTIAL/PLATELET
Abs Immature Granulocytes: 0 K/uL (ref 0.00–0.07)
Basophils Absolute: 0.1 K/uL (ref 0.0–0.1)
Basophils Relative: 1 %
Eosinophils Absolute: 0 K/uL (ref 0.0–0.5)
Eosinophils Relative: 0 %
HCT: 41.9 % (ref 36.0–46.0)
Hemoglobin: 13.2 g/dL (ref 12.0–15.0)
Lymphocytes Relative: 12 %
Lymphs Abs: 0.9 K/uL (ref 0.7–4.0)
MCH: 27.7 pg (ref 26.0–34.0)
MCHC: 31.5 g/dL (ref 30.0–36.0)
MCV: 88 fL (ref 80.0–100.0)
Monocytes Absolute: 0.3 K/uL (ref 0.1–1.0)
Monocytes Relative: 4 %
Neutro Abs: 6.5 K/uL (ref 1.7–7.7)
Neutrophils Relative %: 83 %
Platelets: 261 K/uL (ref 150–400)
RBC: 4.76 MIL/uL (ref 3.87–5.11)
RDW: 16.6 % — ABNORMAL HIGH (ref 11.5–15.5)
WBC: 7.8 K/uL (ref 4.0–10.5)
nRBC: 0 % (ref 0.0–0.2)
nRBC: 0 /100{WBCs}

## 2019-06-21 LAB — COMPREHENSIVE METABOLIC PANEL WITH GFR
ALT: 21 U/L (ref 0–44)
AST: 40 U/L (ref 15–41)
Albumin: 2.8 g/dL — ABNORMAL LOW (ref 3.5–5.0)
Alkaline Phosphatase: 77 U/L (ref 38–126)
Anion gap: 12 (ref 5–15)
BUN: 12 mg/dL (ref 8–23)
CO2: 21 mmol/L — ABNORMAL LOW (ref 22–32)
Calcium: 8.8 mg/dL — ABNORMAL LOW (ref 8.9–10.3)
Chloride: 102 mmol/L (ref 98–111)
Creatinine, Ser: 1.11 mg/dL — ABNORMAL HIGH (ref 0.44–1.00)
GFR calc Af Amer: 58 mL/min — ABNORMAL LOW
GFR calc non Af Amer: 50 mL/min — ABNORMAL LOW
Glucose, Bld: 106 mg/dL — ABNORMAL HIGH (ref 70–99)
Potassium: 3.8 mmol/L (ref 3.5–5.1)
Sodium: 135 mmol/L (ref 135–145)
Total Bilirubin: 0.5 mg/dL (ref 0.3–1.2)
Total Protein: 9.1 g/dL — ABNORMAL HIGH (ref 6.5–8.1)

## 2019-06-21 LAB — TRIGLYCERIDES: Triglycerides: 85 mg/dL

## 2019-06-21 LAB — PROCALCITONIN: Procalcitonin: <0.1 ng/mL

## 2019-06-21 LAB — TROPONIN I (HIGH SENSITIVITY): Troponin I (High Sensitivity): 452 ng/L (ref <18)

## 2019-06-21 LAB — CBG MONITORING, ED: Glucose-Capillary: 110 mg/dL — ABNORMAL HIGH (ref 70–99)

## 2019-06-21 LAB — FIBRINOGEN: Fibrinogen: >800 mg/dL — ABNORMAL HIGH (ref 210–475)

## 2019-06-21 LAB — LACTATE DEHYDROGENASE: LDH: 401 U/L — ABNORMAL HIGH (ref 98–192)

## 2019-06-21 LAB — ABO/RH: ABO/RH(D): O POS

## 2019-06-21 LAB — LACTIC ACID, PLASMA
Lactic Acid, Venous: 2.7 mmol/L (ref 0.5–1.9)
Lactic Acid, Venous: 1.9 mmol/L (ref 0.5–1.9)

## 2019-06-21 LAB — FERRITIN: Ferritin: 572 ng/mL — ABNORMAL HIGH (ref 11–307)

## 2019-06-21 LAB — C-REACTIVE PROTEIN: CRP: 20.5 mg/dL — ABNORMAL HIGH (ref <1.0)

## 2019-06-21 LAB — D-DIMER, QUANTITATIVE: D-Dimer, Quant: 3.08 ug/mL-FEU — ABNORMAL HIGH (ref 0.00–0.50)

## 2019-06-21 MED ORDER — ALBUTEROL SULFATE HFA 108 (90 BASE) MCG/ACT IN AERS
2.0000 | INHALATION_SPRAY | Freq: Four times a day (QID) | RESPIRATORY_TRACT | Status: DC
  Administered 2019-06-21 – 2019-06-29 (×32): 2 via RESPIRATORY_TRACT
  Filled 2019-06-21 (×2): qty 6.7

## 2019-06-21 MED ORDER — ENOXAPARIN SODIUM 40 MG/0.4ML ~~LOC~~ SOLN
40.0000 mg | SUBCUTANEOUS | Status: DC
  Administered 2019-06-21: 21:00:00 40 mg via SUBCUTANEOUS
  Filled 2019-06-21: qty 0.4

## 2019-06-21 MED ORDER — ACETAMINOPHEN 325 MG PO TABS
650.0000 mg | ORAL_TABLET | Freq: Four times a day (QID) | ORAL | Status: DC | PRN
  Administered 2019-06-22 – 2019-06-24 (×4): 650 mg via ORAL
  Filled 2019-06-21 (×4): qty 2

## 2019-06-21 MED ORDER — INSULIN ASPART 100 UNIT/ML ~~LOC~~ SOLN: 0.0000 [IU] | Freq: Every day | SUBCUTANEOUS | Status: DC

## 2019-06-21 MED ORDER — SODIUM CHLORIDE 0.9 % IV SOLN: 100.0000 mg | Freq: Every day | INTRAVENOUS | Status: DC

## 2019-06-21 MED ORDER — PRO-STAT SUGAR FREE PO LIQD
30.0000 mL | Freq: Two times a day (BID) | ORAL | Status: DC
  Administered 2019-06-21 – 2019-06-29 (×16): 30 mL via ORAL
  Filled 2019-06-21 (×16): qty 30

## 2019-06-21 MED ORDER — DEXAMETHASONE SODIUM PHOSPHATE 10 MG/ML IJ SOLN
6.0000 mg | INTRAMUSCULAR | Status: DC
  Administered 2019-06-21 – 2019-06-27 (×7): 6 mg via INTRAVENOUS
  Filled 2019-06-21 (×7): qty 1

## 2019-06-21 MED ORDER — SODIUM CHLORIDE 0.9 % IV SOLN
200.0000 mg | Freq: Once | INTRAVENOUS | Status: AC
  Administered 2019-06-21: 200 mg via INTRAVENOUS
  Filled 2019-06-21: qty 40

## 2019-06-21 MED ORDER — IOHEXOL 350 MG/ML SOLN
80.0000 mL | Freq: Once | INTRAVENOUS | Status: AC | PRN
  Administered 2019-06-21: 80 mL via INTRAVENOUS

## 2019-06-21 MED ORDER — INSULIN ASPART 100 UNIT/ML ~~LOC~~ SOLN
0.0000 [IU] | Freq: Three times a day (TID) | SUBCUTANEOUS | Status: DC
  Administered 2019-06-22: 08:00:00 2 [IU] via SUBCUTANEOUS
  Administered 2019-06-22: 17:00:00 7 [IU] via SUBCUTANEOUS
  Administered 2019-06-22: 5 [IU] via SUBCUTANEOUS

## 2019-06-21 MED ORDER — METHYLPREDNISOLONE SODIUM SUCC 125 MG IJ SOLR
80.0000 mg | Freq: Once | INTRAMUSCULAR | Status: AC
  Administered 2019-06-22: 80 mg via INTRAVENOUS
  Filled 2019-06-21: qty 2

## 2019-06-21 NOTE — ED Triage Notes (Signed)
In by EMS  With repports of increasing dyspnea since testing positive on 06-14-19.  HH seeing her for wound care and called EMS with reports of sats in the 80's.  Pt alert and oriented.  80% on RA and increased to 95 on 10L/[place]

## 2019-06-21 NOTE — H&P (Signed)
TRH H&P    Patient Demographics:    [name], is a 69 y.o. female  MRN: [id number]  DOB - [birth year]  Admit Date - 06/21/2019  Referring MD/NP/PA: Aundra Dubin  Outpatient Primary MD for the patient is Sonia Side., FNP  Patient coming from: home  Chief complaint- dyspnea   HPI:    [name]  is a 69 y.o. female,w anxiety, depression, gout, chronic lower back pain, arthritis,  hypertension, hyperlipidemia, Dm2, h/o TIA, asthma, covid-19 positive,  apparently presents with c/o dyspnea. Subjective fever as well.  Pt notes rare dry cough.  Pt denies cp, palp, n/v, diarrhea, brbpr, black stool .  Pt notes headache,  And alteration in sense of taste and smell.  Pt can't smell.   In ED,  T 99.1 P 78  R 14, Bp 103/65  Pox 100% 6L [place] Wt 141.5kg  CXR IMPRESSION: 1. Mild diffusely increased interstitial lung markings which are likely, in part, chronic in nature.  Wbc 7.8, Hgb 13.2, Plt 261 Na 135, K 3.8, Bun 12, Creatinine 1.11 Ast 40, Alt 21 Alb 2.8 D dimer 3.08 Procalcitonin <0.10 LDH 401 Ferritin 572 Fibrinogen >800 Crp 20.5  Pt will be admitted for acute respiratory failure secondary to covid-19.       Review of systems:    In addition to the HPI above,   No Headache, No changes with Vision or hearing, No problems swallowing food or Liquids, No Chest pain,  No Abdominal pain, No Nausea or Vomiting, bowel movements are regular, No Blood in stool or Urine, No dysuria, No new skin rashes or bruises, No new joints pains-aches,  No new weakness, tingling, numbness in any extremity, No recent weight gain or loss, No polyuria, polydypsia or polyphagia, No significant Mental Stressors.  All other systems reviewed and are negative.    Past History of the following :    Past Medical History:  Diagnosis Date  . Anxiety   . Arthritis    "back, arms, legs" (03/24/2016)  .  Asthma   . Chronic lower back pain   . Colonic polyp    last colonoscopy done in 2009 with normal results per medical record  . Depressive disorder   . Gastric polyp   . Gout    has taken allopurinol 336m once daily in past  . Headache   . History of hiatal hernia   . Hypertension   . Migraine    "none in awhile; might have a couple/year" (03/24/2016)  . Mixed hyperlipidemia    01/2016 Total chol 141, HDL 59, LDL 63, ration 1.1  . Osteoarthritis   . TIA (transient ischemic attack) 11/2014  . Type II diabetes mellitus ([place]       Past Surgical History:  Procedure Laterality Date  . ARTERY BIOPSY Right 08/12/2016   Procedure: BIOPSY TEMPORAL ARTERY;  Surgeon: CElam [name] MD;  [facility]  Service: Vascular;  Laterality: Right;  BIOPSY TEMPORAL ARTERY  . BREAST BIOPSY Left ~ 2015   benign  . CARPAL TUNNEL  RELEASE Bilateral   . DILATION AND CURETTAGE OF UTERUS    . KNEE ARTHROSCOPY Right 2003   in [place]  . LAPAROSCOPIC CHOLECYSTECTOMY    . TUBAL LIGATION    . VAGINAL HYSTERECTOMY  1982      Social History:      Social History   Tobacco Use  . Smoking status: Never Smoker  . Smokeless tobacco: Never Used  Substance Use Topics  . Alcohol use: No       Family History :     Family History  Problem Relation Age of Onset  . Stroke Father   . Stroke Brother   . Cancer Other       Home Medications:   Prior to Admission medications   Medication Sig Start Date End Date Taking? Authorizing Provider  ACCU-CHEK FASTCLIX LANCETS MISC 1 each as needed. 03/20/17   [provider]  ACCU-CHEK SMARTVIEW test strip 1 each 4 (four) times daily. for testing 06/15/17   [provider]  acetaminophen (TYLENOL) 500 MG tablet Take 500 mg by mouth daily as needed for moderate pain.    [provider]  albuterol (PROVENTIL HFA;VENTOLIN HFA) 108 (90 Base) MCG/ACT inhaler Inhale 2 puffs into the lungs every 4 (four) hours as needed for wheezing or  shortness of breath.     [provider]  allopurinol (ZYLOPRIM) 300 MG tablet Take 300 mg by mouth daily.    [provider]  aspirin 325 MG tablet Take 325 mg by mouth daily.    [provider]  atenolol (TENORMIN) 25 MG tablet Take 25-50 mg by mouth 2 (two) times daily. Take 50 mg every morning and then 25 mg every evening    [provider]  Blood Glucose Monitoring Suppl (ACCU-CHEK NANO SMARTVIEW) w/Device KIT 1 each See admin instructions. 05/23/17   [provider]  Cholecalciferol (VITAMIN D3) 2000 units capsule Take 2,000 Units by mouth daily.    [provider]  cyclobenzaprine (FLEXERIL) 10 MG tablet Take 10 mg by mouth at bedtime as needed for muscle spasms.     [provider]  diclofenac sodium (VOLTAREN) 1 % GEL Apply 4 g topically 4 (four) times daily. Patient taking differently: Apply 4 g topically 4 (four) times daily as needed (pain).  02/25/16   Henson, [name] L, NP-C  Diclofenac Sodium CR (VOLTAREN-XR) 100 MG 24 hr tablet Take 1 tablet (100 mg total) by mouth daily. 09/09/17   Palumbo, April, MD  doxycycline (VIBRAMYCIN) 100 MG capsule Take 1 capsule (100 mg total) by mouth 2 (two) times daily. 05/13/19   Law, Bea Graff, PA-C  furosemide (LASIX) 20 MG tablet Take 1 tablet (20 mg total) by mouth daily. 07/07/16   Thurnell Lose, MD  gabapentin (NEURONTIN) 300 MG capsule Take 1 capsule (300 mg total) by mouth 2 (two) times daily. Patient taking differently: Take 600 mg by mouth 2 (two) times daily.  01/31/16   Henson, [name] L, NP-C  hydrocerin (EUCERIN) CREA Apply 1 application topically daily. 01/15/16   Thurnell Lose, MD  HYDROcodone-acetaminophen (NORCO/VICODIN) 5-325 MG tablet Take 1-2 tablets by mouth every 6 (six) hours as needed. Patient not taking: Reported on 08/11/2017 02/26/17   Ward, Delice Bison, DO  insulin degludec (TRESIBA FLEXTOUCH) 100 UNIT/ML SOPN FlexTouch Pen Inject 0.75 mLs (75 Units total) into the  skin daily. Patient taking differently: Inject 75 Units into the skin every morning.  03/14/16   Henson, [name] L, NP-C  magnesium gluconate (  MAGONATE) 500 MG tablet Take 500 mg by mouth daily.    [provider]  NIFEdipine (PROCARDIA XL/ADALAT-CC) 90 MG 24 hr tablet Take 90 mg by mouth daily.    [provider]  NOVOFINE 32G X 6 MM MISC 1 each as needed. 08/09/17   [provider]  NOVOLOG FLEXPEN 100 UNIT/ML FlexPen Take 17 Units by mouth 2 (two) times daily. 01/30/17   [provider]  polyethylene glycol (MIRALAX / GLYCOLAX) packet Take 17 g by mouth daily as needed for moderate constipation.    [provider]  potassium chloride (K-DUR) 10 MEQ tablet Take 1 tablet (10 mEq total) by mouth daily. 07/07/16   Thurnell Lose, MD  protein supplement shake (PREMIER PROTEIN) LIQD Take 325 mLs (11 oz total) by mouth daily. 03/18/17   Eugenie Filler, MD     Allergies:     Allergies  Allergen Reactions  . Ace Inhibitors Swelling    SWELLING REACTION UNSPECIFIED   . Tizanidine   . Metformin And Related Other (See Comments)    CHILLS  . Propofol Itching     Physical Exam:   Vitals  Blood pressure 108/60, pulse 77, temperature 99.1 F (37.3 C), temperature source Oral, resp. rate (!) 39, height 5' 5"  (1.651 m), weight (!) 141.5 kg, SpO2 92 %.  1.  General: axoxo3  2. Psychiatric: euthymic  3. Neurologic: Nonfocal  4. HEENMT:  Anicteric, pupils 1.20m symmetric, direct, consensual intact Neck: no jvd  5. Respiratory : CTAB  6. Cardiovascular : rrr s1, s2,   7. Gastrointestinal:  Abd: soft, nt, nd, +bs  8. Skin:  Ext: no c/c/e,  No rash  9.Musculoskeletal:  Good ROM    Data Review:    CBC Recent Labs  Lab 06/21/19 1713  WBC 7.8  HGB 13.2  HCT 41.9  PLT 261  MCV 88.0  MCH 27.7  MCHC 31.5  RDW 16.6*  LYMPHSABS 0.9  MONOABS 0.3  EOSABS 0.0  BASOSABS 0.1    ------------------------------------------------------------------------------------------------------------------  Results for orders placed or performed during the hospital encounter of 06/21/19 (from the past 48 hour(s))  Lactic acid, plasma     Status: Abnormal   Collection Time: 06/21/19  5:13 PM  Result Value Ref Range   Lactic Acid, Venous 2.7 (HH) 0.5 - 1.9 mmol/L    Comment: CRITICAL RESULT CALLED TO, READ BACK BY AND VERIFIED WITH: M.COFFEY RN 1819 06/21/19 MCCORMICK K Performed at [facility] [address], [place] [address]  CBC WITH DIFFERENTIAL     Status: Abnormal   Collection Time: 06/21/19  5:13 PM  Result Value Ref Range   WBC 7.8 4.0 - 10.5 K/uL   RBC 4.76 3.87 - 5.11 MIL/uL   Hemoglobin 13.2 12.0 - 15.0 g/dL   HCT 41.9 36.0 - 46.0 %   MCV 88.0 80.0 - 100.0 fL   MCH 27.7 26.0 - 34.0 pg   MCHC 31.5 30.0 - 36.0 g/dL   RDW 16.6 (H) 11.5 - 15.5 %   Platelets 261 150 - 400 K/uL   nRBC 0.0 0.0 - 0.2 %   Neutrophils Relative % 83 %   Neutro Abs 6.5 1.7 - 7.7 K/uL   Lymphocytes Relative 12 %   Lymphs Abs 0.9 0.7 - 4.0 K/uL   Monocytes Relative 4 %   Monocytes Absolute 0.3 0.1 - 1.0 K/uL   Eosinophils Relative 0 %   Eosinophils Absolute 0.0 0.0 - 0.5 K/uL  Basophils Relative 1 %   Basophils Absolute 0.1 0.0 - 0.1 K/uL   nRBC 0 0 /100 WBC   Abs Immature Granulocytes 0.00 0.00 - 0.07 K/uL    Comment: Performed at [facility], [place] [address] [place], [place] [address]  Comprehensive metabolic panel     Status: Abnormal   Collection Time: 06/21/19  5:13 PM  Result Value Ref Range   Sodium 135 135 - 145 mmol/L   Potassium 3.8 3.5 - 5.1 mmol/L   Chloride 102 98 - 111 mmol/L   CO2 21 (L) 22 - 32 mmol/L   Glucose, Bld 106 (H) 70 - 99 mg/dL   BUN 12 8 - 23 mg/dL   Creatinine, Ser 1.11 (H) 0.44 - 1.00 mg/dL   Calcium 8.8 (L) 8.9 - 10.3 mg/dL   Total Protein 9.1 (H) 6.5 - 8.1 g/dL   Albumin 2.8 (L) 3.5 - 5.0 g/dL   AST 40 15 - 41 U/L    ALT 21 0 - 44 U/L   Alkaline Phosphatase 77 38 - 126 U/L   Total Bilirubin 0.5 0.3 - 1.2 mg/dL   GFR calc non Af Amer 50 (L) >60 mL/min   GFR calc Af Amer 58 (L) >60 mL/min   Anion gap 12 5 - 15    Comment: Performed at [facility], [place] [address], [place], [place] [address]  D-dimer, quantitative     Status: Abnormal   Collection Time: 06/21/19  5:13 PM  Result Value Ref Range   D-Dimer, Quant 3.08 (H) 0.00 - 0.50 ug/mL-FEU    Comment: (NOTE) At the manufacturer cut-off of 0.50 ug/mL FEU, this assay has been documented to exclude PE with a sensitivity and negative predictive value of 97 to 99%.  At this time, this assay has not been approved by the FDA to exclude DVT/VTE. Results should be correlated with clinical presentation. Performed at [facility], [place] [address], [place], [place] [address]   Procalcitonin     Status: None   Collection Time: 06/21/19  5:13 PM  Result Value Ref Range   Procalcitonin <0.10 ng/mL    Comment:        Interpretation: PCT (Procalcitonin) <= 0.5 ng/mL: Systemic infection (sepsis) is not likely. Local bacterial infection is possible. (NOTE)       Sepsis PCT Algorithm           Lower Respiratory Tract                                      Infection PCT Algorithm    ----------------------------     ----------------------------         PCT < 0.25 ng/mL                PCT < 0.10 ng/mL         Strongly encourage             Strongly discourage   discontinuation of antibiotics    initiation of antibiotics    ----------------------------     -----------------------------       PCT 0.25 - 0.50 ng/mL            PCT 0.10 - 0.25 ng/mL               OR       >80% decrease in PCT  Discourage initiation of                                            antibiotics      Encourage discontinuation           of antibiotics    ----------------------------     -----------------------------         PCT >= 0.50 ng/mL              PCT 0.26 - 0.50  ng/mL               AND        <80% decrease in PCT             Encourage initiation of                                             antibiotics       Encourage continuation           of antibiotics    ----------------------------     -----------------------------        PCT >= 0.50 ng/mL                  PCT > 0.50 ng/mL               AND         increase in PCT                  Strongly encourage                                      initiation of antibiotics    Strongly encourage escalation           of antibiotics                                     -----------------------------                                           PCT <= 0.25 ng/mL                                                 OR                                        > 80% decrease in PCT                                     Discontinue / Do not initiate  antibiotics Performed at [facility], [place] [address], [place], [place] [address]   Lactate dehydrogenase     Status: Abnormal   Collection Time: 06/21/19  5:13 PM  Result Value Ref Range   LDH 401 (H) 98 - 192 U/L    Comment: Performed at [facility] [address] [place], [place] [address]  Ferritin     Status: Abnormal   Collection Time: 06/21/19  5:13 PM  Result Value Ref Range   Ferritin 572 (H) 11 - 307 ng/mL    Comment: Performed at [facility], [place] [address] [place], [place] [address]  Fibrinogen     Status: Abnormal   Collection Time: 06/21/19  5:13 PM  Result Value Ref Range   Fibrinogen >800 (H) 210 - 475 mg/dL    Comment: REPEATED TO VERIFY Performed at [facility] [address] [place], [place] [address]   C-reactive protein     Status: Abnormal   Collection Time: 06/21/19  5:13 PM  Result Value Ref Range   CRP 20.5 (H) <1.0 mg/dL    Comment: Performed at [facility] [address], [place], [place] [address]  Triglycerides     Status: None   Collection Time:  06/21/19  5:40 PM  Result Value Ref Range   Triglycerides 85 <150 mg/dL    Comment: Performed at [facility] [address] [place], [place] [address]    Chemistries  Recent Labs  Lab 06/21/19 1713  NA 135  K 3.8  CL 102  CO2 21*  GLUCOSE 106*  BUN 12  CREATININE 1.11*  CALCIUM 8.8*  AST 40  ALT 21  ALKPHOS 77  BILITOT 0.5   ------------------------------------------------------------------------------------------------------------------  ------------------------------------------------------------------------------------------------------------------ GFR: Estimated Creatinine Clearance: 67.6 mL/min (A) (by C-G formula based on SCr of 1.11 mg/dL (H)). Liver Function Tests: Recent Labs  Lab 06/21/19 1713  AST 40  ALT 21  ALKPHOS 77  BILITOT 0.5  PROT 9.1*  ALBUMIN 2.8*   No results for input(s): LIPASE, AMYLASE in the last 168 hours. No results for input(s): AMMONIA in the last 168 hours. Coagulation Profile: No results for input(s): INR, PROTIME in the last 168 hours. Cardiac Enzymes: No results for input(s): CKTOTAL, CKMB, CKMBINDEX, TROPONINI in the last 168 hours. BNP (last 3 results) No results for input(s): PROBNP in the last 8760 hours. HbA1C: No results for input(s): HGBA1C in the last 72 hours. CBG: No results for input(s): GLUCAP in the last 168 hours. Lipid Profile: Recent Labs    06/21/19 1740  TRIG 85   Thyroid Function Tests: No results for input(s): TSH, T4TOTAL, FREET4, T3FREE, THYROIDAB in the last 72 hours. Anemia Panel: Recent Labs    06/21/19 1713  FERRITIN 572*    --------------------------------------------------------------------------------------------------------------- Urine analysis:    Component Value Date/Time   COLORURINE YELLOW 03/16/2017 2237   APPEARANCEUR CLEAR 03/16/2017 2237   LABSPEC 1.017 03/16/2017 2237   PHURINE 5.0 03/16/2017 2237   GLUCOSEU NEGATIVE 03/16/2017 2237   HGBUR NEGATIVE 03/16/2017  2237   [place] NEGATIVE 03/16/2017 2237   KETONESUR NEGATIVE 03/16/2017 2237   PROTEINUR 30 (A) 03/16/2017 2237   NITRITE NEGATIVE 03/16/2017 2237   LEUKOCYTESUR NEGATIVE 03/16/2017 2237      Imaging Results:    DG Chest Port 1 View  Result Date: 06/21/2019 CLINICAL DATA:  Shortness of breath. EXAM: PORTABLE CHEST 1 VIEW COMPARISON:  September 09, 2017 FINDINGS: Mild diffusely increased interstitial lung markings  are noted. This is unchanged in appearance when compared to the prior study. No focal consolidation is identified. The heart size and mediastinal contours are within normal limits. There is tortuosity of the descending thoracic aorta. Degenerative changes seen throughout the thoracic spine. IMPRESSION: 1. Mild diffusely increased interstitial lung markings which are likely, in part, chronic in nature. Electronically Signed   By: Virgina Norfolk M.D.   On: 06/21/2019 18:22     Assessment & Plan:    Principal Problem:   COVID-19 Active Problems:   Hypertension   Uncontrolled diabetes mellitus type 2 with peripheral artery disease (HCC)   Severe protein-calorie malnutrition (HCC)  Acute respiratory failure with hypoxia secondary to Covid-19 Covid -19 infection,  Dexamethasone 17m iv qday  Remdesivir pharmacy consult Consider Actemra  D dimer + Check CTA chest r/o PE  Asthma Cont Albuterol HFA   Severe protein- calorie malnutrition Prostat 341mpo bid  Hypertension Cont Atenolol  Cont Nifedipine 9043mo qday Cont Lasix 70m34m qday Cont Kcl 10 meq po qday  Dm2 fsbs ac and qhs, ISS  Chronic back pain, Neuropathy  Cont Gabapentin 300mg72mbid Cont Norco   Gout Cont Allopurinol 300mg 68mday  DVT Prophylaxis-   Lovenox - SCDs   AM Labs Ordered, also please review Full Orders  Family Communication: Admission, patients condition and plan of care including tests being ordered have been discussed with the patient  who indicate understanding and agree with  the plan and Code Status.  Code Status:  FULL CODE per patient  Admission status: Inpatient: Based on patients clinical presentation and evaluation of above clinical data, I have made determination that patient meets Inpatient criteria at this time.  Pt will require iv dexamethasone and iv remdesivir, pt has high risk of clinical deterioration,  Pt will require > 2nite stay.   Time spent in minutes : 55 minutes,    Eliodoro Gullett Jani Graveln 06/21/2019 at 7:17 PM

## 2019-06-21 NOTE — ED Notes (Signed)
Pt's hypoxic 85% [place] 6/L, RR 37. Pt placed on NRB 12/L, SPO2 95%. Admitting paged.

## 2019-06-21 NOTE — ED Notes (Signed)
Attempted report to [facility], no response.

## 2019-06-21 NOTE — ED Notes (Signed)
Attempted report to [facility] again, no response.

## 2019-06-21 NOTE — ED Provider Notes (Signed)
[facility] DEPART[facility] Provider Note   CSN: [id number] Arrival date & time: 06/21/19  1658     History No chief complaint on file.   [name] is a 69 y.o. female.  69 y/o F with PMH obesity, asthma, type 2 diabetes, htn, hyperlipidemia, chornic LE wounds presents to the ER with SOB, hypoxia and +covid test 06/14/2019. Reports symptoms of covid began a few days after christmas and worsened last night. Reports feeling SOB all last night. Reports decreased appetite but no n/v/d, chest pain. Has been using albuterol inhaler w/o relief. Was reportedly 80% on RA documented by EMS as well as CNA here in ED. Currently on 3 liters oxygen with normal sats.         Past Medical History:  Diagnosis Date  . Anxiety   . Arthritis    "back, arms, legs" (03/24/2016)  . Asthma   . Chronic lower back pain   . Colonic polyp    last colonoscopy done in 2009 with normal results per medical record  . Depressive disorder   . Gastric polyp   . Gout    has taken allopurinol 351m once daily in past  . Headache   . History of hiatal hernia   . Hypertension   . Migraine    "none in awhile; might have a couple/year" (03/24/2016)  . Mixed hyperlipidemia    01/2016 Total chol 141, HDL 59, LDL 63, ration 1.1  . Osteoarthritis   . TIA (transient ischemic attack) 11/2014  . Type II diabetes mellitus ([facility]     Patient Active Problem List   Diagnosis Date Noted  . COVID-19 06/21/2019  . Severe protein-calorie malnutrition ([facility] 06/21/2019  . Diabetic foot ulcer associated with type 2 diabetes mellitus ([facility]   . Cellulitis 07/05/2016  . Depression with anxiety 05/30/2016  . Chronic skin ulcer of lower leg ([facility] 05/30/2016  . Generalized weakness 05/30/2016  . Left arm weakness 05/30/2016  . Left Lower extremity pain  04/02/2016  . Lower extremity pain, inferior, left 04/02/2016  . Left leg pain   . Personal history of noncompliance with medical treatment,  presenting hazards to health 03/31/2016  . Jaw pain 03/27/2016  . Left leg cellulitis 03/24/2016  . Cellulitis of left lower extremity   . Primary insomnia   . Adjustment disorder   . Morbid obesity ([facility] 01/27/2016  . Wheelchair dependent 01/27/2016  . Gout 01/27/2016  . Asthma 01/27/2016  . Arthritis 01/27/2016  . Uncontrolled diabetes mellitus type 2 with peripheral artery disease ([facility] 01/25/2016  . Cellulitis of left leg 01/11/2016  . Insulin-requiring or dependent type II diabetes mellitus ([facility] 01/11/2016  . Hypertension 01/11/2016    Past Surgical History:  Procedure Laterality Date  . ARTERY BIOPSY Right 08/12/2016   Procedure: BIOPSY TEMPORAL ARTERY;  Surgeon: CElam [name] MD;  [place]  Service: Vascular;  Laterality: Right;  BIOPSY TEMPORAL ARTERY  . BREAST BIOPSY Left ~ 2015   benign  . CARPAL TUNNEL RELEASE Bilateral   . DILATION AND CURETTAGE OF UTERUS    . [place]AROSCOPIC CHOLE[place]STECTOMY    . TUBAL LIGATION    . VAGINAL HYSTERECTOMY  1982     OB History   No obstetric history on file.     Family History  Problem Relation Age of Onset  . Stroke Father   . Stroke Brother   . Cancer Other  Social History   Tobacco Use  . Smoking status: Never Smoker  . Smokeless tobacco: Never Used  Substance Use Topics  . Alcohol use: No  . Drug use: No    Home Medications Prior to Admission medications   Medication Sig Start Date End Date Taking? Authorizing Provider  ACCU-CHEK FASTCLIX LANCETS MISC 1 each as needed. 03/20/17   [provider]  ACCU-CHEK SMARTVIEW test strip 1 each 4 (four) times daily. for testing 06/15/17   [provider]  acetaminophen (TYLENOL) 500 MG tablet Take 500 mg by mouth daily as needed for moderate pain.    [provider]  albuterol (PROVENTIL HFA;VENTOLIN HFA) 108 (90 Base) MCG/ACT inhaler Inhale 2 puffs into the lungs every 4 (four) hours as needed  for wheezing or shortness of breath.     [provider]  allopurinol (ZYLOPRIM) 300 MG tablet Take 300 mg by mouth daily.    [provider]  aspirin 325 MG tablet Take 325 mg by mouth daily.    [provider]  atenolol (TENORMIN) 25 MG tablet Take 25-50 mg by mouth 2 (two) times daily. Take 50 mg every morning and then 25 mg every evening    [provider]  Blood Glucose Monitoring Suppl (ACCU-CHEK NANO SMARTVIEW) w/Device KIT 1 each See admin instructions. 05/23/17   [provider]  Cholecalciferol (VITAMIN D3) 2000 units capsule Take 2,000 Units by mouth daily.    [provider]  cyclobenzaprine (FLEXERIL) 10 MG tablet Take 10 mg by mouth at bedtime as needed for muscle spasms.     [provider]  diclofenac sodium (VOLTAREN) 1 % GEL Apply 4 g topically 4 (four) times daily. Patient taking differently: Apply 4 g topically 4 (four) times daily as needed (pain).  02/25/16   Henson, [name] L, NP-C  Diclofenac Sodium CR (VOLTAREN-XR) 100 MG 24 hr tablet Take 1 tablet (100 mg total) by mouth daily. 09/09/17   Palumbo, April, MD  doxycycline (VIBRAMYCIN) 100 MG capsule Take 1 capsule (100 mg total) by mouth 2 (two) times daily. 05/13/19   Law, Bea Graff, PA-C  furosemide (LASIX) 20 MG tablet Take 1 tablet (20 mg total) by mouth daily. 07/07/16   Thurnell Lose, MD  gabapentin (NEURONTIN) 300 MG capsule Take 1 capsule (300 mg total) by mouth 2 (two) times daily. Patient taking differently: Take 600 mg by mouth 2 (two) times daily.  01/31/16   Henson, [name] L, NP-C  hydrocerin (EUCERIN) CREA Apply 1 application topically daily. 01/15/16   Thurnell Lose, MD  HYDROcodone-acetaminophen (NORCO/VICODIN) 5-325 MG tablet Take 1-2 tablets by mouth every 6 (six) hours as needed. Patient not taking: Reported on 08/11/2017 02/26/17   Ward, Delice Bison, DO  insulin degludec (TRESIBA FLEXTOUCH) 100 UNIT/ML SOPN FlexTouch Pen Inject 0.75 mLs (75 Units  total) into the skin daily. Patient taking differently: Inject 75 Units into the skin every morning.  03/14/16   Henson, [name] L, NP-C  magnesium gluconate (MAGONATE) 500 MG tablet Take 500 mg by mouth daily.    [provider]  NIFEdipine (PROCARDIA XL/ADALAT-CC) 90 MG 24 hr tablet Take 90 mg by mouth daily.    [provider]  NOVOFINE 32G X 6 MM MISC 1 each as needed. 08/09/17   [provider]  NOVOLOG FLEXPEN 100 UNIT/ML FlexPen Take 17 Units by mouth 2 (two) times daily. 01/30/17   [provider]  polyethylene glycol (MIRALAX / GLYCOLAX) packet Take 17 g by mouth  daily as needed for moderate constipation.    [provider]  potassium chloride (K-DUR) 10 MEQ tablet Take 1 tablet (10 mEq total) by mouth daily. 07/07/16   Thurnell Lose, MD  protein supplement shake (PREMIER PROTEIN) LIQD Take 325 mLs (11 oz total) by mouth daily. 03/18/17   Eugenie Filler, MD    Allergies    Ace inhibitors, Tizanidine, Metformin and related, and Propofol  Review of Systems   Review of Systems  Constitutional: Positive for appetite change, fatigue and fever (T high of 101.4). Negative for activity change, chills and diaphoresis.  HENT: Negative for congestion, rhinorrhea and sore throat.   Respiratory: Positive for cough and shortness of breath. Negative for wheezing and stridor.   Cardiovascular: Negative for chest pain and leg swelling.  Gastrointestinal: Negative for abdominal pain, diarrhea, nausea and vomiting.  Genitourinary: Negative for dysuria.  Musculoskeletal: Positive for myalgias. Negative for back pain.  Skin: Positive for wound (chronic LE bilateral ). Negative for rash.  Neurological: Negative for headaches.  Hematological: Does not bruise/bleed easily.    Physical Exam Updated Vital Signs BP 108/60   Pulse 77   Temp 99.1 F (37.3 C) (Oral)   Resp (!) 39   Ht 5' 5"  (1.651 m)   Wt (!) 141.5 kg   SpO2 92%   BMI 51.92 kg/m    Physical Exam Vitals and nursing note reviewed.  Constitutional:      General: She is not in acute distress.    Appearance: Normal appearance. She is obese. She is not toxic-appearing or diaphoretic.  HENT:     Head: Normocephalic.     Mouth/Throat:     Mouth: Mucous membranes are moist.  Eyes:     Conjunctiva/sclera: Conjunctivae normal.  Cardiovascular:     Rate and Rhythm: Normal rate and regular rhythm.  Pulmonary:     Breath sounds: Rales (bilaterally at the bases) present.     Comments: Mildly tachypnic, NAD, speaking full sentences on nasal cannula Abdominal:     General: Abdomen is flat.     Tenderness: There is no abdominal tenderness. There is no guarding.  Musculoskeletal:     Right lower leg: Edema present.     Left lower leg: Edema present.  Skin:    General: Skin is warm and dry.     Comments: Significant LE edema bilaterally with dry and scaling skin, no drainage.   Neurological:     Mental Status: She is alert.  Psychiatric:        Mood and Affect: Mood normal.     ED Results / Procedures / Treatments   Labs (all labs ordered are listed, but only abnormal results are displayed) Labs Reviewed  LACTIC ACID, PLASMA - Abnormal; Notable for the following components:      Result Value   Lactic Acid, Venous 2.7 (*)    All other components within normal limits  CBC WITH DIFFERENTIAL/PLATELET - Abnormal; Notable for the following components:   RDW 16.6 (*)    All other components within normal limits  COMPREHENSIVE METABOLIC PANEL - Abnormal; Notable for the following components:   CO2 21 (*)    Glucose, Bld 106 (*)    Creatinine, Ser 1.11 (*)    Calcium 8.8 (*)    Total Protein 9.1 (*)    Albumin 2.8 (*)    GFR calc non Af Amer 50 (*)    GFR calc Af Amer 58 (*)    All other components within  normal limits  D-DIMER, QUANTITATIVE (NOT AT [facility]) - Abnormal; Notable for the following components:   D-Dimer, Quant 3.08 (*)    All other components within  normal limits  LACTATE DEHYDROGENASE - Abnormal; Notable for the following components:   LDH 401 (*)    All other components within normal limits  FERRITIN - Abnormal; Notable for the following components:   Ferritin 572 (*)    All other components within normal limits  FIBRINOGEN - Abnormal; Notable for the following components:   Fibrinogen >800 (*)    All other components within normal limits  C-REACTIVE PROTEIN - Abnormal; Notable for the following components:   CRP 20.5 (*)    All other components within normal limits  CULTURE, BLOOD (ROUTINE X 2)  CULTURE, BLOOD (ROUTINE X 2)  PROCALCITONIN  TRIGLYCERIDES  LACTIC ACID, PLASMA  HIV ANTIBODY (ROUTINE TESTING W REFLEX)  CBC WITH DIFFERENTIAL/PLATELET  COMPREHENSIVE METABOLIC PANEL  ABO/RH  TROPONIN I (HIGH SENSITIVITY)    EKG EKG Interpretation  Date/Time:  Tuesday June 21 2019 17:03:01 EST Ventricular Rate:  79 PR Interval:    QRS Duration: 86 QT Interval:  365 QTC Calculation: 419 R Axis:   13 Text Interpretation: Sinus rhythm Low voltage, precordial leads Confirmed by Madalyn Rob [phone]) on 06/21/2019 5:42:52 PM   Radiology DG Chest Port 1 View  Result Date: 06/21/2019 CLINICAL DATA:  Shortness of breath. EXAM: PORTABLE CHEST 1 VIEW COMPARISON:  September 09, 2017 FINDINGS: Mild diffusely increased interstitial lung markings are noted. This is unchanged in appearance when compared to the prior study. No focal consolidation is identified. The heart size and mediastinal contours are within normal limits. There is tortuosity of the descending thoracic aorta. Degenerative changes seen throughout the thoracic spine. IMPRESSION: 1. Mild diffusely increased interstitial lung markings which are likely, in part, chronic in nature. Electronically Signed   By: Virgina Norfolk M.D.   On: 06/21/2019 18:22    Procedures Procedures (including critical care time)  Medications Ordered in ED Medications  enoxaparin (LOVENOX)  injection 40 mg (has no administration in time range)  remdesivir 200 mg in sodium chloride 0.9% 250 mL IVPB (has no administration in time range)    Followed by  remdesivir 100 mg in sodium chloride 0.9 % 100 mL IVPB (has no administration in time range)  dexamethasone (DECADRON) injection 6 mg (has no administration in time range)  acetaminophen (TYLENOL) tablet 650 mg (has no administration in time range)  insulin aspart (novoLOG) injection 0-9 Units (has no administration in time range)  insulin aspart (novoLOG) injection 0-5 Units (has no administration in time range)    ED Course  I have reviewed the triage vital signs and the nursing notes.  Pertinent labs & imaging results that were available during my care of the patient were reviewed by me and considered in my medical decision making (see chart for details).  Clinical Course as of Jun 20 1921  Tue Jun 21, 2019  1843 Covid positive requiring 3l oxygen via [place]. Chet xray and EKG without acute findings. Will consult with hospitalist for admission    [KM]  1912 Spoke with hospitalist for admission of this patient Dr. Maudie Mercury who accepts.    [KM]    Clinical Course User Index [KM] Kristine Royal   MDM Rules/Calculators/A&P                      CRITICAL CARE Performed by: Alveria Apley   Total  critical care time: 35 minutes  Critical care time was exclusive of separately billable procedures and treating other patients.  Critical care was necessary to treat or prevent imminent or life-threatening deterioration.  Critical care was time spent personally by me on the following activities: development of treatment plan with patient and/or surrogate as well as nursing, discussions with consultants, evaluation of patient's response to treatment, examination of patient, obtaining history from patient or surrogate, ordering and performing treatments and interventions, ordering and review of laboratory studies, ordering and review  of radiographic studies, pulse oximetry and re-evaluation of patient's condition.  Final Clinical Impression(s) / ED Diagnoses Final diagnoses:  COVID-19  Hypoxia    Rx / DC Orders ED Discharge Orders    None       Kristine Royal 06/21/19 1923    Lucrezia Starch, MD 06/22/19 1312

## 2019-06-22 ENCOUNTER — Encounter (HOSPITAL_COMMUNITY): Payer: Self-pay | Admitting: Internal Medicine

## 2019-06-22 ENCOUNTER — Encounter (HOSPITAL_BASED_OUTPATIENT_CLINIC_OR_DEPARTMENT_OTHER): Payer: Medicare PPO | Admitting: Physician Assistant

## 2019-06-22 ENCOUNTER — Inpatient Hospital Stay (HOSPITAL_COMMUNITY): Payer: Medicare PPO

## 2019-06-22 DIAGNOSIS — E119 Type 2 diabetes mellitus without complications: Secondary | ICD-10-CM | POA: Diagnosis present

## 2019-06-22 DIAGNOSIS — F32A Depression, unspecified: Secondary | ICD-10-CM | POA: Diagnosis present

## 2019-06-22 DIAGNOSIS — E785 Hyperlipidemia, unspecified: Secondary | ICD-10-CM | POA: Diagnosis present

## 2019-06-22 DIAGNOSIS — I10 Essential (primary) hypertension: Secondary | ICD-10-CM | POA: Diagnosis present

## 2019-06-22 DIAGNOSIS — E7800 Pure hypercholesterolemia, unspecified: Secondary | ICD-10-CM

## 2019-06-22 DIAGNOSIS — Z794 Long term (current) use of insulin: Secondary | ICD-10-CM | POA: Diagnosis present

## 2019-06-22 DIAGNOSIS — F419 Anxiety disorder, unspecified: Secondary | ICD-10-CM | POA: Diagnosis present

## 2019-06-22 DIAGNOSIS — IMO0002 Reserved for concepts with insufficient information to code with codable children: Secondary | ICD-10-CM | POA: Diagnosis present

## 2019-06-22 DIAGNOSIS — F329 Major depressive disorder, single episode, unspecified: Secondary | ICD-10-CM

## 2019-06-22 DIAGNOSIS — E1165 Type 2 diabetes mellitus with hyperglycemia: Secondary | ICD-10-CM | POA: Diagnosis present

## 2019-06-22 LAB — HEMOGLOBIN A1C
Hgb A1c MFr Bld: 7.7 % — ABNORMAL HIGH (ref 4.8–5.6)
Mean Plasma Glucose: 174.29 mg/dL

## 2019-06-22 LAB — COMPREHENSIVE METABOLIC PANEL
ALT: 21 U/L (ref 0–44)
AST: 32 U/L (ref 15–41)
Albumin: 3 g/dL — ABNORMAL LOW (ref 3.5–5.0)
Alkaline Phosphatase: 74 U/L (ref 38–126)
Anion gap: 13 (ref 5–15)
BUN: 14 mg/dL (ref 8–23)
CO2: 23 mmol/L (ref 22–32)
Calcium: 8.7 mg/dL — ABNORMAL LOW (ref 8.9–10.3)
Chloride: 103 mmol/L (ref 98–111)
Creatinine, Ser: 0.92 mg/dL (ref 0.44–1.00)
GFR calc Af Amer: >60 mL/min (ref >60)
GFR calc non Af Amer: >60 mL/min (ref >60)
Glucose, Bld: 196 mg/dL — ABNORMAL HIGH (ref 70–99)
Potassium: 4.1 mmol/L (ref 3.5–5.1)
Sodium: 139 mmol/L (ref 135–145)
Total Bilirubin: 0.9 mg/dL (ref 0.3–1.2)
Total Protein: 8.6 g/dL — ABNORMAL HIGH (ref 6.5–8.1)

## 2019-06-22 LAB — LIPID PANEL
Cholesterol: 100 mg/dL (ref 0–200)
HDL: 39 mg/dL — ABNORMAL LOW (ref >40)
LDL Cholesterol: 48 mg/dL (ref 0–99)
Total CHOL/HDL Ratio: 2.6 RATIO
Triglycerides: 64 mg/dL (ref <150)
VLDL: 13 mg/dL (ref 0–40)

## 2019-06-22 LAB — GLUCOSE, CAPILLARY
Glucose-Capillary: 194 mg/dL — ABNORMAL HIGH (ref 70–99)
Glucose-Capillary: 255 mg/dL — ABNORMAL HIGH (ref 70–99)
Glucose-Capillary: 299 mg/dL — ABNORMAL HIGH (ref 70–99)
Glucose-Capillary: 352 mg/dL — ABNORMAL HIGH (ref 70–99)

## 2019-06-22 LAB — CBC WITH DIFFERENTIAL/PLATELET
Abs Immature Granulocytes: 0.04 10*3/uL (ref 0.00–0.07)
Basophils Absolute: 0 10*3/uL (ref 0.0–0.1)
Basophils Relative: 0 %
Eosinophils Absolute: 0 10*3/uL (ref 0.0–0.5)
Eosinophils Relative: 0 %
HCT: 41 % (ref 36.0–46.0)
Hemoglobin: 12.6 g/dL (ref 12.0–15.0)
Immature Granulocytes: 0 %
Lymphocytes Relative: 8 %
Lymphs Abs: 0.7 10*3/uL (ref 0.7–4.0)
MCH: 27.1 pg (ref 26.0–34.0)
MCHC: 30.7 g/dL (ref 30.0–36.0)
MCV: 88.2 fL (ref 80.0–100.0)
Monocytes Absolute: 0.3 10*3/uL (ref 0.1–1.0)
Monocytes Relative: 3 %
Neutro Abs: 8.2 10*3/uL — ABNORMAL HIGH (ref 1.7–7.7)
Neutrophils Relative %: 89 %
Platelets: 261 10*3/uL (ref 150–400)
RBC: 4.65 MIL/uL (ref 3.87–5.11)
RDW: 16.8 % — ABNORMAL HIGH (ref 11.5–15.5)
WBC: 9.3 10*3/uL (ref 4.0–10.5)
nRBC: 0 % (ref 0.0–0.2)

## 2019-06-22 LAB — HIV ANTIBODY (ROUTINE TESTING W REFLEX): HIV Screen 4th Generation wRfx: NONREACTIVE

## 2019-06-22 LAB — FERRITIN: Ferritin: 470 ng/mL — ABNORMAL HIGH (ref 11–307)

## 2019-06-22 LAB — MAGNESIUM: Magnesium: 2 mg/dL (ref 1.7–2.4)

## 2019-06-22 LAB — D-DIMER, QUANTITATIVE: D-Dimer, Quant: 2.7 ug/mL-FEU — ABNORMAL HIGH (ref 0.00–0.50)

## 2019-06-22 LAB — C-REACTIVE PROTEIN: CRP: 24.1 mg/dL — ABNORMAL HIGH (ref <1.0)

## 2019-06-22 LAB — PHOSPHORUS: Phosphorus: 2.5 mg/dL (ref 2.5–4.6)

## 2019-06-22 MED ORDER — ENOXAPARIN SODIUM 80 MG/0.8ML ~~LOC~~ SOLN
70.0000 mg | SUBCUTANEOUS | Status: DC
  Administered 2019-06-22 – 2019-06-28 (×7): 70 mg via SUBCUTANEOUS
  Filled 2019-06-22 (×7): qty 0.8

## 2019-06-22 MED ORDER — INSULIN ASPART 100 UNIT/ML ~~LOC~~ SOLN
0.0000 [IU] | SUBCUTANEOUS | Status: DC
  Administered 2019-06-22: 23:00:00 15 [IU] via SUBCUTANEOUS
  Administered 2019-06-22: 8 [IU] via SUBCUTANEOUS
  Administered 2019-06-23: 09:00:00 5 [IU] via SUBCUTANEOUS
  Administered 2019-06-23: 13:00:00 11 [IU] via SUBCUTANEOUS
  Administered 2019-06-23: 5 [IU] via SUBCUTANEOUS
  Administered 2019-06-23 (×2): 8 [IU] via SUBCUTANEOUS
  Administered 2019-06-23: 17:00:00 11 [IU] via SUBCUTANEOUS
  Administered 2019-06-24: 5 [IU] via SUBCUTANEOUS
  Administered 2019-06-24 (×3): 3 [IU] via SUBCUTANEOUS
  Administered 2019-06-24: 8 [IU] via SUBCUTANEOUS
  Administered 2019-06-24: 3 [IU] via SUBCUTANEOUS
  Administered 2019-06-25: 8 [IU] via SUBCUTANEOUS
  Administered 2019-06-25: 12:00:00 5 [IU] via SUBCUTANEOUS
  Administered 2019-06-25: 20:00:00 3 [IU] via SUBCUTANEOUS
  Administered 2019-06-25: 04:00:00 11 [IU] via SUBCUTANEOUS
  Administered 2019-06-25: 3 [IU] via SUBCUTANEOUS
  Administered 2019-06-25: 09:00:00 5 [IU] via SUBCUTANEOUS
  Administered 2019-06-26: 8 [IU] via SUBCUTANEOUS
  Administered 2019-06-26: 11 [IU] via SUBCUTANEOUS
  Administered 2019-06-26: 5 [IU] via SUBCUTANEOUS
  Administered 2019-06-26: 21:00:00 8 [IU] via SUBCUTANEOUS
  Administered 2019-06-26: 16:00:00 3 [IU] via SUBCUTANEOUS
  Administered 2019-06-27 (×2): 5 [IU] via SUBCUTANEOUS
  Administered 2019-06-27: 04:00:00 8 [IU] via SUBCUTANEOUS
  Administered 2019-06-27 (×2): 5 [IU] via SUBCUTANEOUS
  Administered 2019-06-27: 21:00:00 8 [IU] via SUBCUTANEOUS
  Administered 2019-06-28: 21:00:00 5 [IU] via SUBCUTANEOUS
  Administered 2019-06-28: 01:00:00 3 [IU] via SUBCUTANEOUS
  Administered 2019-06-28 (×2): 5 [IU] via SUBCUTANEOUS
  Administered 2019-06-28: 11 [IU] via SUBCUTANEOUS
  Administered 2019-06-28: 09:00:00 8 [IU] via SUBCUTANEOUS
  Administered 2019-06-29: 3 [IU] via SUBCUTANEOUS
  Administered 2019-06-29: 01:00:00 5 [IU] via SUBCUTANEOUS
  Administered 2019-06-29: 05:00:00 3 [IU] via SUBCUTANEOUS
  Administered 2019-06-29: 13:00:00 2 [IU] via SUBCUTANEOUS

## 2019-06-22 MED ORDER — IOHEXOL 350 MG/ML SOLN
100.0000 mL | Freq: Once | INTRAVENOUS | Status: AC | PRN
  Administered 2019-06-22: 100 mL via INTRAVENOUS

## 2019-06-22 MED ORDER — SODIUM CHLORIDE 0.9 % IV SOLN
100.0000 mg | Freq: Every day | INTRAVENOUS | Status: AC
  Administered 2019-06-22 – 2019-06-25 (×4): 100 mg via INTRAVENOUS
  Filled 2019-06-22 (×4): qty 20

## 2019-06-22 MED ORDER — TOCILIZUMAB 400 MG/20ML IV SOLN
800.0000 mg | Freq: Once | INTRAVENOUS | Status: AC
  Administered 2019-06-22: 800 mg via INTRAVENOUS
  Filled 2019-06-22: qty 40

## 2019-06-22 MED ORDER — LORAZEPAM 2 MG/ML IJ SOLN
2.0000 mg | Freq: Once | INTRAMUSCULAR | Status: AC
  Administered 2019-06-22: 12:00:00 2 mg via INTRAVENOUS
  Filled 2019-06-22: qty 1

## 2019-06-22 NOTE — Progress Notes (Signed)
PROGRESS NOTE    [name]  MOQ:[id number] DOB: 23-Jul-1950 DOA: 06/21/2019 PCP: Sonia Side., FNP   Brief Narrative:  69 y/o BF with PMHx anxiety, depressive disorder, TIA, obesity, asthma, diabetes type 2 uncontrolled with complication, HTN, HLD, chronic lower extremity wound   Presents to the ER with SOB, hypoxia and +covid test 06/14/2019. Reports symptoms of covid began a few days after christmas and worsened last night. Reports feeling SOB all last night. Reports decreased appetite but no n/v/d, chest pain. Has been using albuterol inhaler w/o relief. Was reportedly 80% on RA documented by EMS as well as CNA here in ED. Currently on 3 liters oxygen with normal sats.    Subjective: A/O x4, afebrile overnight, negative CP, negative abdominal pain, positive S OB.  Agrees to attempt CT scan, just requests something for pain/anxiety   Assessment & Plan:   Principal Problem:   COVID-19 Active Problems:   Hypertension   Uncontrolled diabetes mellitus type 2 with peripheral artery disease (HCC)   Severe protein-calorie malnutrition ([place])   Acute respiratory failure with hypoxia (HCC)   Diabetes mellitus type 2, uncontrolled, with complications ([place])   Anxiety   Depressive disorder   Essential hypertension   HLD (hyperlipidemia)  Covid pneumonia/acute respiratory failure with hypoxia COVID-19 Labs  Recent Labs    06/21/19 1713 06/22/19 1305  DDIMER 3.08* 2.70*  FERRITIN 572*  --   LDH 401*  --   CRP 20.5* 24.1*    Lab Results  Component Value Date   SARSCOV2NAA Detected (A) 06/14/2019  -Decadron 6 mg daily -Remdesivir per pharmacy protocol -1/6 Actemra x1 dose -CTA PE protocol pending  Essential HTN -BP controlled without medication monitor closely  PAD  Diabetes type 2 uncontrolled with complication -1/6 hemoglobin A1c= 7.7 -Moderate SSI  HLD 1/6 LDL= 48   Severe protein calorie malnutrition  Anxiety/Depressive Disorder   Chronic back  pain/neuropathy/anxiety -Ativan 2 mg prior to CTA PE protocol    DVT prophylaxis: Lovenox Code Status: Full Family Communication:  Disposition Plan: TBD   Consultants:    Procedures/Significant Events:     I have personally reviewed and interpreted all radiology studies and my findings are as above.  VENTILATOR SETTINGS: HFNC 1/6 Flow; 6 L/min SPO2; 89%   Cultures   Antimicrobials:    06/22/19 1600  remdesivir 100 mg in sodium chloride 0.9 % 100 mL IVPB     100 mg 200 mL/hr over 30 Minutes 06/26/19 0959   06/21/19 2000  remdesivir 200 mg in sodium chloride 0.9% 250 mL IVPB     200 mg 580 mL/hr over 30 Minutes 06/21/19 2240       Devices    LINES / TUBES:      Continuous Infusions: . remdesivir 100 mg in NS 100 mL Stopped (06/22/19 1610)     Objective: Vitals:   06/22/19 1440 06/22/19 1515 06/22/19 1720 06/22/19 1725  BP:  124/78    Pulse: 96 95 93 96  Resp: (!) 24 (!) 24 (!) 29 19  Temp:      TempSrc:      SpO2: 92% 92% (!) 89% (!) 89%  Weight:      Height:        Intake/Output Summary (Last 24 hours) at 06/22/2019 1728 Last data filed at 06/22/2019 1514 Gross per 24 hour  Intake 590 ml  Output 400 ml  Net 190 ml   Filed Weights   06/21/19 1710  Weight: (!) 141.5 kg  Examination:  General: A/O x4, positive acute respiratory distress Eyes: negative scleral hemorrhage, negative anisocoria, negative icterus ENT: Negative Runny nose, negative gingival bleeding, Neck:  Negative scars, masses, torticollis, lymphadenopathy, JVD Lungs: Tachypneic diffuse decreased breath sounds bilaterally without wheezes or crackles Cardiovascular: Tachycardic regular rate and rhythm without murmur gallop or rub normal S1 and S2 Abdomen: MORBIDLY OBESE negative abdominal pain, nondistended, positive soft, bowel sounds, no rebound, no ascites, no appreciable mass Extremities: No significant cyanosis, clubbing, or edema bilateral lower extremities Skin:  Negative rashes, lesions, ulcers Psychiatric:  Negative depression, negative anxiety, negative fatigue, negative mania  Central nervous system:  Cranial nerves II through XII intact, tongue/uvula midline, all extremities muscle strength 5/5, sensation intact throughout, negative dysarthria, negative expressive aphasia, negative receptive aphasia.  .     Data Reviewed: Care during the described time interval was provided by me .  I have reviewed this patient's available data, including medical history, events of note, physical examination, and all test results as part of my evaluation.   CBC: Recent Labs  Lab 06/21/19 1713 06/22/19 0315  WBC 7.8 9.3  NEUTROABS 6.5 8.2*  HGB 13.2 12.6  HCT 41.9 41.0  MCV 88.0 88.2  PLT 261 261   Basic Metabolic Panel: Recent Labs  Lab 06/21/19 1713 06/22/19 0315 06/22/19 1305  NA 135 139  --   K 3.8 4.1  --   CL 102 103  --   CO2 21* 23  --   GLUCOSE 106* 196*  --   BUN 12 14  --   CREATININE 1.11* 0.92  --   CALCIUM 8.8* 8.7*  --   MG  --   --  2.0  PHOS  --   --  2.5   GFR: Estimated Creatinine Clearance: 81.6 mL/min (by C-G formula based on SCr of 0.92 mg/dL). Liver Function Tests: Recent Labs  Lab 06/21/19 1713 06/22/19 0315  AST 40 32  ALT 21 21  ALKPHOS 77 74  BILITOT 0.5 0.9  PROT 9.1* 8.6*  ALBUMIN 2.8* 3.0*   No results for input(s): LIPASE, AMYLASE in the last 168 hours. No results for input(s): AMMONIA in the last 168 hours. Coagulation Profile: No results for input(s): INR, PROTIME in the last 168 hours. Cardiac Enzymes: No results for input(s): CKTOTAL, CKMB, CKMBINDEX, TROPONINI in the last 168 hours. BNP (last 3 results) No results for input(s): PROBNP in the last 8760 hours. HbA1C: Recent Labs    06/22/19 1305  HGBA1C 7.7*   CBG: Recent Labs  Lab 06/21/19 2243 06/22/19 0737 06/22/19 1114  GLUCAP 110* 194* 255*   Lipid Profile: Recent Labs    06/21/19 1740 06/22/19 1305  CHOL  --  100  HDL   --  39*  LDLCALC  --  48  TRIG 85 64  CHOLHDL  --  2.6   Thyroid Function Tests: No results for input(s): TSH, T4TOTAL, FREET4, T3FREE, THYROIDAB in the last 72 hours. Anemia Panel: Recent Labs    06/21/19 1713  FERRITIN 572*   Urine analysis:    Component Value Date/Time   COLORURINE YELLOW 03/16/2017 2237   APPEARANCEUR CLEAR 03/16/2017 2237   LABSPEC 1.017 03/16/2017 2237   PHURINE 5.0 03/16/2017 2237   GLUCOSEU NEGATIVE 03/16/2017 2237   HGBUR NEGATIVE 03/16/2017 2237   BILIRUBINUR NEGATIVE 03/16/2017 2237   KETONESUR NEGATIVE 03/16/2017 2237   PROTEINUR 30 (A) 03/16/2017 2237   NITRITE NEGATIVE 03/16/2017 2237   LEUKOCYTESUR NEGATIVE 03/16/2017 2237   Sepsis Labs: @LABRCNTIP (procalcitonin:4,lacticidven:4)  )  Recent Results (from the past 240 hour(s))  Novel Coronavirus, NAA ([facility])     Status: Abnormal   Collection Time: 06/14/19  8:50 AM   Specimen: Nasopharyngeal(NP) swabs in vial transport medium   NASOPHARYNGE  TESTING  Result Value Ref Range Status   SARS-CoV-2, NAA Detected (A) Not Detected Final    Comment: This nucleic acid amplification test was developed and its performance characteristics determined by [employer]. Nucleic acid amplification tests include PCR and TMA. This test has not been FDA cleared or approved. This test has been authorized by FDA under an Emergency Use Authorization (EUA). This test is only authorized for the duration of time the declaration that circumstances exist justifying the authorization of the emergency use of in vitro diagnostic tests for detection of SARS-CoV-2 virus and/or diagnosis of COVID-19 infection under section 564(b)(1) of the Act, 21 U.S.C. 161WRU-0(A) (1), unless the authorization is terminated or revoked sooner. When diagnostic testing is negative, the possibility of a false negative result should be considered in the context of a patient's recent exposures and the presence of clinical signs and  symptoms consistent with COVID-19. An individual without symptoms of COVID-19 and who is not shedding SARS-CoV-2 virus would  expect to have a negative (not detected) result in this assay.   Blood Culture (routine x 2)     Status: None (Preliminary result)   Collection Time: 06/21/19  5:13 PM   Specimen: BLOOD  Result Value Ref Range Status   Specimen Description BLOOD LEFT ANTECUBITAL  Final   Special Requests   Final    BOTTLES DRAWN AEROBIC AND ANAEROBIC Blood Culture results may not be optimal due to an inadequate volume of blood received in culture bottles   Culture   Final    NO GROWTH < 24 HOURS Performed at [facility], [address] [place], [place] [address]    Report Status PENDING  Incomplete  Blood Culture (routine x 2)     Status: None (Preliminary result)   Collection Time: 06/21/19  5:18 PM   Specimen: BLOOD  Result Value Ref Range Status   Specimen Description BLOOD SITE NOT SPECIFIED  Final   Special Requests   Final    BOTTLES DRAWN AEROBIC AND ANAEROBIC Blood Culture results may not be optimal due to an inadequate volume of blood received in culture bottles   Culture   Final    NO GROWTH < 24 HOURS Performed at [facility], [address], [place], [place] [address]    Report Status PENDING  Incomplete         Radiology Studies: CT ANGIO CHEST PE W OR WO CONTRAST  Result Date: 06/22/2019 CLINICAL DATA:  Shortness of breath, elevated D-dimer, COVID-19 positive. EXAM: CT ANGIOGRAPHY CHEST WITH CONTRAST TECHNIQUE: Multidetector CT imaging of the chest was performed using the standard protocol during bolus administration of intravenous contrast. Multiplanar CT image reconstructions and MIPs were obtained to evaluate the vascular anatomy. CONTRAST:  OMNIPAQUE IOHEXOL 350 MG/ML SOLN COMPARISON:  Chest radiograph 06/21/2019. FINDINGS: Cardiovascular: Image quality is markedly degraded by respiratory motion and body habitus. Lobar, segmental and  subsegmental pulmonary arteries therefore cannot be evaluated. No large central pulmonary embolus. Pulmonic trunk and heart are enlarged. No pericardial effusion. Mediastinum/Nodes: No pathologically enlarged mediastinal, hilar or axillary lymph nodes. Esophagus is grossly unremarkable. Lungs/Pleura: Marked image degradation by respiratory motion, expiratory phase imaging and body habitus. Extensive peribronchovascular and subpleural ground-glass opacification. No pleural fluid. Upper Abdomen: Image  quality in the upper abdomen is markedly degraded by body habitus and respiratory motion. Visualized structures are grossly unremarkable. Musculoskeletal: Degenerative changes in the spine. Review of the MIP images confirms the above findings. IMPRESSION: 1. Image quality is markedly degraded by respiratory motion and body habitus, limiting the evaluation of the lobar, segmental and subsegmental pulmonary arteries. No large central pulmonary embolus. 2. Pulmonary parenchymal pattern of diffuse peribronchovascular and subpleural ground-glass is in keeping with moderate to severe COVID-19 pneumonia. 3. Enlarged pulmonic trunk, indicative of pulmonary arterial hypertension. Electronically Signed   By: Leanna Battles M.D.   On: 06/22/2019 13:26   DG Chest Port 1 View  Result Date: 06/21/2019 CLINICAL DATA:  Shortness of breath. EXAM: PORTABLE CHEST 1 VIEW COMPARISON:  September 09, 2017 FINDINGS: Mild diffusely increased interstitial lung markings are noted. This is unchanged in appearance when compared to the prior study. No focal consolidation is identified. The heart size and mediastinal contours are within normal limits. There is tortuosity of the descending thoracic aorta. Degenerative changes seen throughout the thoracic spine. IMPRESSION: 1. Mild diffusely increased interstitial lung markings which are likely, in part, chronic in nature. Electronically Signed   By: Aram Candela M.D.   On: 06/21/2019 18:22         Scheduled Meds: . albuterol  2 puff Inhalation Q6H  . dexamethasone (DECADRON) injection  6 mg Intravenous Q24H  . enoxaparin (LOVENOX) injection  70 mg Subcutaneous Q24H  . feeding supplement (PRO-STAT SUGAR FREE 64)  30 mL Oral BID  . insulin aspart  0-5 Units Subcutaneous QHS  . insulin aspart  0-9 Units Subcutaneous TID WC   Continuous Infusions: . remdesivir 100 mg in NS 100 mL Stopped (06/22/19 1610)     LOS: 1 day   The patient is critically ill with multiple organ systems failure and requires high complexity decision making for assessment and support, frequent evaluation and titration of therapies, application of advanced monitoring technologies and extensive interpretation of multiple databases. Critical Care Time devoted to patient care services described in this note  Time spent: 40 minutes     Jese Comella, Roselind Messier, MD [facility] Pager [phone]  If 7PM-7AM, please contact night-coverage [email or web address] Password [facility] 06/22/2019, 5:28 PM

## 2019-06-22 NOTE — Progress Notes (Signed)
Pt is on 8L HFNC. Sats now 95%. Pt reports feeling better with breathing. Pt sitting up in bed. Pt does have pain in lower back 9/10. Gave tylenol and will contact MD for severe pain med. VSS.

## 2019-06-22 NOTE — Consult Note (Signed)
WOC Nurse Consult Note: Patient receiving care in CGV 9130; patient is COVID +.  I spoke with the primary RN, Zuel, by phone [phone]).  She has agreed to photograph the wounds for the EMR so that the consult can be continued for treatment of the areas. Helmut Muster, RN, MSN, CWOCN, CNS-BC, pager [phone]

## 2019-06-22 NOTE — Progress Notes (Signed)
Gave pt full bath. Pt able to wash front upper body. Bilat legs very flaky, dusty, crusted and some tenderness per pt. Pt legs are soaking under warm wash cloths with moisturizer.

## 2019-06-22 NOTE — Progress Notes (Signed)
CT chest completed. Pt given ativan 2mg  IV pre procedure. Pt is hyper. Will continue to monitor

## 2019-06-22 NOTE — Progress Notes (Signed)
Pt back on 6L HFNC and sats 88-92%. Pt is encouraged to use IS and flutter valve. Pt has not been able to meet IS goal of 1250. Pt able to reach 75-1000. Pt needs motivation and reminding to use devices.

## 2019-06-22 NOTE — Progress Notes (Signed)
Pt now on 5L [place] and sats 92-96%. NAD. WOCN request nurse to take pictures of pts lower legs. Will send to [facility].

## 2019-06-22 NOTE — Plan of Care (Signed)

## 2019-06-22 NOTE — Consult Note (Signed)
WOC Nurse Consult Note: Reason for Consult:Bilateral lower extremity venous insufficiency wounds.  Patient is seen at wound care center and will continue ongoing topical orders to promote healing.  Wears 3 layer compression and will order modified light compression for now.  Wound type:chronic nonhealing venous wounds.  Pressure Injury POA: NA Measurement:not assessed.  Bedside RN to obtain and add to the flow sheet.  Wound bed: pale pink per wound care center Drainage (amount, consistency, odor) minimal serosanguinous  No odor Periwound: Edema Dressing procedure/placement/frequency: Cleanse wounds to bilateral lower legs with NS and pat dry. Apply Aquacel  (LAwson # [id number]) to wound bed.  Wrap from below toes to below knees.  Secure with self adherent Coban (LAWSON [id number]).  Change twice weekly on Wednesday and Saturday.  Bedside RN to apply.   Will not follow at this time.  Please re-consult if needed.  Maple Hudson MSN, RN, FNP-BC CWON Wound, Ostomy, Continence Nurse Pager [phone]

## 2019-06-22 NOTE — Progress Notes (Signed)
Ok to increase lovenox to 0.5mg /kg/day due to BMI per Dr. Joseph Art.  Increase lovenox to 70mg  SQ qday  , PharmD, BCIDP, AAHIVP, CPP Infectious Disease Pharmacist 06/22/2019 1:54 PM

## 2019-06-22 NOTE — Progress Notes (Signed)
Pt is refusing wound bandages and aquacel. Pt c/o that dressing pulls her scabs on legs and leaves her bleeding. Pt does not want dry dressing to legs.

## 2019-06-23 DIAGNOSIS — E669 Obesity, unspecified: Secondary | ICD-10-CM | POA: Diagnosis present

## 2019-06-23 DIAGNOSIS — E1151 Type 2 diabetes mellitus with diabetic peripheral angiopathy without gangrene: Secondary | ICD-10-CM

## 2019-06-23 DIAGNOSIS — L97909 Non-pressure chronic ulcer of unspecified part of unspecified lower leg with unspecified severity: Secondary | ICD-10-CM

## 2019-06-23 LAB — CBC WITH DIFFERENTIAL/PLATELET
Abs Immature Granulocytes: 0.08 10*3/uL — ABNORMAL HIGH (ref 0.00–0.07)
Basophils Absolute: 0 10*3/uL (ref 0.0–0.1)
Basophils Relative: 0 %
Eosinophils Absolute: 0 10*3/uL (ref 0.0–0.5)
Eosinophils Relative: 0 %
HCT: 41 % (ref 36.0–46.0)
Hemoglobin: 12.7 g/dL (ref 12.0–15.0)
Immature Granulocytes: 1 %
Lymphocytes Relative: 12 %
Lymphs Abs: 0.8 10*3/uL (ref 0.7–4.0)
MCH: 27.3 pg (ref 26.0–34.0)
MCHC: 31 g/dL (ref 30.0–36.0)
MCV: 88.2 fL (ref 80.0–100.0)
Monocytes Absolute: 0.4 10*3/uL (ref 0.1–1.0)
Monocytes Relative: 5 %
Neutro Abs: 5.5 10*3/uL (ref 1.7–7.7)
Neutrophils Relative %: 82 %
Platelets: 344 10*3/uL (ref 150–400)
RBC: 4.65 MIL/uL (ref 3.87–5.11)
RDW: 16.6 % — ABNORMAL HIGH (ref 11.5–15.5)
WBC: 6.8 10*3/uL (ref 4.0–10.5)
nRBC: 0 % (ref 0.0–0.2)

## 2019-06-23 LAB — COMPREHENSIVE METABOLIC PANEL
ALT: 26 U/L (ref 0–44)
AST: 40 U/L (ref 15–41)
Albumin: 2.8 g/dL — ABNORMAL LOW (ref 3.5–5.0)
Alkaline Phosphatase: 77 U/L (ref 38–126)
Anion gap: 13 (ref 5–15)
BUN: 19 mg/dL (ref 8–23)
CO2: 18 mmol/L — ABNORMAL LOW (ref 22–32)
Calcium: 9.1 mg/dL (ref 8.9–10.3)
Chloride: 105 mmol/L (ref 98–111)
Creatinine, Ser: 0.94 mg/dL (ref 0.44–1.00)
GFR calc Af Amer: >60 mL/min (ref >60)
GFR calc non Af Amer: >60 mL/min (ref >60)
Glucose, Bld: 288 mg/dL — ABNORMAL HIGH (ref 70–99)
Potassium: 4.1 mmol/L (ref 3.5–5.1)
Sodium: 136 mmol/L (ref 135–145)
Total Bilirubin: 0.3 mg/dL (ref 0.3–1.2)
Total Protein: 8.8 g/dL — ABNORMAL HIGH (ref 6.5–8.1)

## 2019-06-23 LAB — HEPATITIS B SURFACE ANTIBODY, QUANTITATIVE: Hep B S AB Quant (Post): <3.1 m[IU]/mL — ABNORMAL LOW (ref >9.9)

## 2019-06-23 LAB — PHOSPHORUS: Phosphorus: 2.4 mg/dL — ABNORMAL LOW (ref 2.5–4.6)

## 2019-06-23 LAB — GLUCOSE, CAPILLARY
Glucose-Capillary: 248 mg/dL — ABNORMAL HIGH (ref 70–99)
Glucose-Capillary: 278 mg/dL — ABNORMAL HIGH (ref 70–99)
Glucose-Capillary: 286 mg/dL — ABNORMAL HIGH (ref 70–99)
Glucose-Capillary: 302 mg/dL — ABNORMAL HIGH (ref 70–99)
Glucose-Capillary: 311 mg/dL — ABNORMAL HIGH (ref 70–99)
Glucose-Capillary: 325 mg/dL — ABNORMAL HIGH (ref 70–99)

## 2019-06-23 LAB — MAGNESIUM: Magnesium: 2.1 mg/dL (ref 1.7–2.4)

## 2019-06-23 LAB — FERRITIN: Ferritin: 759 ng/mL — ABNORMAL HIGH (ref 11–307)

## 2019-06-23 LAB — D-DIMER, QUANTITATIVE: D-Dimer, Quant: 2.11 ug/mL-FEU — ABNORMAL HIGH (ref 0.00–0.50)

## 2019-06-23 LAB — C-REACTIVE PROTEIN: CRP: 20.6 mg/dL — ABNORMAL HIGH (ref <1.0)

## 2019-06-23 MED ORDER — POLYETHYLENE GLYCOL 3350 17 G PO PACK
17.0000 g | PACK | Freq: Every day | ORAL | Status: DC
  Administered 2019-06-23 – 2019-06-25 (×3): 17 g via ORAL
  Filled 2019-06-23 (×5): qty 1

## 2019-06-23 MED ORDER — ASPIRIN 325 MG PO TABS
325.0000 mg | ORAL_TABLET | Freq: Every day | ORAL | Status: DC
  Administered 2019-06-23 – 2019-06-29 (×7): 325 mg via ORAL
  Filled 2019-06-23 (×7): qty 1

## 2019-06-23 MED ORDER — INSULIN GLARGINE 100 UNIT/ML ~~LOC~~ SOLN
10.0000 [IU] | Freq: Every day | SUBCUTANEOUS | Status: DC
  Administered 2019-06-23 – 2019-06-25 (×3): 10 [IU] via SUBCUTANEOUS
  Filled 2019-06-23 (×3): qty 0.1

## 2019-06-23 MED ORDER — ALLOPURINOL 100 MG PO TABS
300.0000 mg | ORAL_TABLET | Freq: Every day | ORAL | Status: DC
  Administered 2019-06-23 – 2019-06-29 (×7): 300 mg via ORAL
  Filled 2019-06-23 (×7): qty 3

## 2019-06-23 NOTE — Progress Notes (Signed)
Inpatient Diabetes Program Recommendations  AACE/ADA: New Consensus Statement on Inpatient Glycemic Control (2015)  Target Ranges:  Prepandial:   less than 140 mg/dL      Peak postprandial:   less than 180 mg/dL (1-2 hours)      Critically ill patients:  140 - 180 mg/dL   Lab Results  Component Value Date   GLUCAP 278 (H) 06/23/2019   HGBA1C 7.7 (H) 06/22/2019  Results for OLETA, GUNNOE (MRN [id number]) as of 06/23/2019 15:12  Ref. Range 06/22/2019 11:14 06/22/2019 16:53 06/22/2019 18:06 06/22/2019 22:43 06/23/2019 03:15  Glucose-Capillary Latest Ref Range: 70 - 99 mg/dL 510 (H) 712 (H) 524 (H) 352 (H) 278 (H)    Review of Glycemic Control  Diabetes history: type 2 Outpatient Diabetes medications: Tresiba 75 units daily, Novolog 17 units BID Current orders for Inpatient glycemic control: Novolog MODERATE correction scale every 4 hours.  Inpatient Diabetes Program Recommendations:   Noted that blood sugars continue to be greater than 200 mg/dl.   Recommend adding Lantus 28 units daily (141.5 kg X 0.2 units/kg) and continue Novolog MODERATE correction scale as ordered.  Titrate dosages as needed.   Smith Mince RN BSN CDE Diabetes Coordinator Pager: [phone]  8am-5pm

## 2019-06-23 NOTE — Progress Notes (Signed)
PROGRESS NOTE    [name]  LNL:[id number] DOB: [birth year] DOA: 06/21/2019 PCP: Raymon Mutton., FNP   Brief Narrative:  69 y/o BF with PMHx anxiety, depressive disorder, TIA, obesity, asthma, diabetes type 2 uncontrolled with complication, HTN, HLD, chronic lower extremity wound   Presents to the ER with SOB, hypoxia and +covid test 06/14/2019. Reports symptoms of covid began a few days after christmas and worsened last night. Reports feeling SOB all last night. Reports decreased appetite but no n/v/d, chest pain. Has been using albuterol inhaler w/o relief. Was reportedly 80% on RA documented by EMS as well as CNA here in ED. Currently on 3 liters oxygen with normal sats.    Subjective: 1/7 last 24 hours afebrile A/O x4, negative CP, negative abdominal pain.  Positive S OB   Assessment & Plan:   Principal Problem:   COVID-19 Active Problems:   Hypertension   Uncontrolled diabetes mellitus type 2 with peripheral artery disease (HCC)   Gout   Adjustment disorder   Chronic skin ulcer of lower leg (HCC)   Severe protein-calorie malnutrition (HCC)   Acute respiratory failure with hypoxia (HCC)   Diabetes mellitus type 2, uncontrolled, with complications (HCC)   Anxiety   Depressive disorder   Essential hypertension   HLD (hyperlipidemia)   Morbidly obese (HCC)  Covid pneumonia/acute respiratory failure with hypoxia COVID-19 Labs  Recent Labs    06/21/19 1713 06/22/19 1305 06/23/19 0145  DDIMER 3.08* 2.70* 2.11*  FERRITIN 572* 470* 759*  LDH 401*  --   --   CRP 20.5* 24.1* 20.6*    Lab Results  Component Value Date   SARSCOV2NAA Detected (A) 06/14/2019  -Decadron 6 mg daily -Remdesivir per pharmacy protocol -1/6 Actemra x1 dose -CTA PE protocol negative for PE see results below  Essential HTN -BP controlled without medication monitor closely  PAD  Diabetes type 2 uncontrolled with complication -1/6 hemoglobin A1c= 7.7 -1/6 Lantus 10 units  daily -Moderate SSI  HLD 1/6 LDL= 48   Severe protein calorie malnutrition -Encourage p.o. intake  -Prostat BID  Anxiety/Depressive Disorder   Chronic back pain/neuropathy/anxiety -Ativan 2 mg prior to CTA PE protocol  Lower extremity chronic wounds -1/6 wound care consult  Morbidly obese (BMI 51.92 kg/m)    DVT prophylaxis: Lovenox Code Status: Full Family Communication:  Disposition Plan: TBD   Consultants:    Procedures/Significant Events:  1/6 CTA chest PE protocol;-negative PE - Pulmonary parenchymal pattern of diffuse peribronchovascular and subpleural ground-glass is in keeping with moderate to severe COVID-19 pneumonia. 3. Enlarged pulmonic trunk, indicative of pulmonary arterial hypertension.   I have personally reviewed and interpreted all radiology studies and my findings are as above.  VENTILATOR SETTINGS: HFNC 1/7 Flow; 9 L/min SPO2; 94%   Cultures   Antimicrobials:    06/22/19 1600  remdesivir 100 mg in sodium chloride 0.9 % 100 mL IVPB     100 mg 200 mL/hr over 30 Minutes 06/26/19 0959   06/21/19 2000  remdesivir 200 mg in sodium chloride 0.9% 250 mL IVPB     200 mg 580 mL/hr over 30 Minutes 06/21/19 2240       Devices    LINES / TUBES:      Continuous Infusions: . remdesivir 100 mg in NS 100 mL Stopped (06/23/19 0956)     Objective: Vitals:   06/23/19 1500 06/23/19 1505 06/23/19 1510 06/23/19 1515  BP:      Pulse: 87 86 91 93  Resp: (!) 23 (!)  24 (!) 26 (!) 21  Temp:      TempSrc:      SpO2: 93% 96% 91% 97%  Weight:      Height:        Intake/Output Summary (Last 24 hours) at 06/23/2019 1621 Last data filed at 06/23/2019 6599 Gross per 24 hour  Intake 220 ml  Output 350 ml  Net -130 ml   Filed Weights   06/21/19 1710  Weight: (!) 141.5 kg   Physical Exam:  General: A/O x4, positive acute respiratory distress Eyes: negative scleral hemorrhage, negative anisocoria, negative icterus ENT: Negative Runny  nose, negative gingival bleeding, Neck:  Negative scars, masses, torticollis, lymphadenopathy, JVD Lungs: Tachypneic diffuse decreased air movement bilaterally without wheezes or crackles Cardiovascular: Regular rate and rhythm without murmur gallop or rub normal S1 and S2 Abdomen: MORBIDLY OBESE negative abdominal pain, nondistended, positive soft, bowel sounds, no rebound, no ascites, no appreciable mass Extremities: No significant cyanosis, clubbing, or edema bilateral lower extremities, chronic bilateral lower extremity ulceration Skin: Negative rashes, lesions, ulcers Psychiatric:  Negative depression, negative anxiety, negative fatigue, negative mania  Central nervous system:  Cranial nerves II through XII intact, tongue/uvula midline, all extremities muscle strength 5/5, sensation intact throughout, negative dysarthria, negative expressive aphasia, negative receptive aphasia.  .     Data Reviewed: Care during the described time interval was provided by me .  I have reviewed this patient's available data, including medical history, events of note, physical examination, and all test results as part of my evaluation.   CBC: Recent Labs  Lab 06/21/19 1713 06/22/19 0315 06/23/19 0145  WBC 7.8 9.3 6.8  NEUTROABS 6.5 8.2* 5.5  HGB 13.2 12.6 12.7  HCT 41.9 41.0 41.0  MCV 88.0 88.2 88.2  PLT 261 261 344   Basic Metabolic Panel: Recent Labs  Lab 06/21/19 1713 06/22/19 0315 06/22/19 1305 06/23/19 0145  NA 135 139  --  136  K 3.8 4.1  --  4.1  CL 102 103  --  105  CO2 21* 23  --  18*  GLUCOSE 106* 196*  --  288*  BUN 12 14  --  19  CREATININE 1.11* 0.92  --  0.94  CALCIUM 8.8* 8.7*  --  9.1  MG  --   --  2.0 2.1  PHOS  --   --  2.5 2.4*   GFR: Estimated Creatinine Clearance: 79.8 mL/min (by C-G formula based on SCr of 0.94 mg/dL). Liver Function Tests: Recent Labs  Lab 06/21/19 1713 06/22/19 0315 06/23/19 0145  AST 40 32 40  ALT 21 21 26   ALKPHOS 77 74 77  BILITOT  0.5 0.9 0.3  PROT 9.1* 8.6* 8.8*  ALBUMIN 2.8* 3.0* 2.8*   No results for input(s): LIPASE, AMYLASE in the last 168 hours. No results for input(s): AMMONIA in the last 168 hours. Coagulation Profile: No results for input(s): INR, PROTIME in the last 168 hours. Cardiac Enzymes: No results for input(s): CKTOTAL, CKMB, CKMBINDEX, TROPONINI in the last 168 hours. BNP (last 3 results) No results for input(s): PROBNP in the last 8760 hours. HbA1C: Recent Labs    06/22/19 1305  HGBA1C 7.7*   CBG: Recent Labs  Lab 06/22/19 1114 06/22/19 1653 06/22/19 1806 06/22/19 2243 06/23/19 0315  GLUCAP 255* 302* 299* 352* 278*   Lipid Profile: Recent Labs    06/21/19 1740 06/22/19 1305  CHOL  --  100  HDL  --  39*  LDLCALC  --  48  TRIG 85 64  CHOLHDL  --  2.6   Thyroid Function Tests: No results for input(s): TSH, T4TOTAL, FREET4, T3FREE, THYROIDAB in the last 72 hours. Anemia Panel: Recent Labs    06/22/19 1305 06/23/19 0145  FERRITIN 470* 759*   Urine analysis:    Component Value Date/Time   COLORURINE YELLOW 03/16/2017 2237   APPEARANCEUR CLEAR 03/16/2017 2237   LABSPEC 1.017 03/16/2017 2237   PHURINE 5.0 03/16/2017 2237   GLUCOSEU NEGATIVE 03/16/2017 2237   HGBUR NEGATIVE 03/16/2017 2237   [place] NEGATIVE 03/16/2017 [place] 03/16/2017 2237   PROTEINUR 30 (A) 03/16/2017 2237   NITRITE NEGATIVE 03/16/2017 2237   LEUKOCYTESUR NEGATIVE 03/16/2017 2237   Sepsis Labs: @LABRCNTIP (procalcitonin:4,lacticidven:4)  ) Recent Results (from the past 240 hour(s))  Novel Coronavirus, NAA ([facility])     Status: Abnormal   Collection Time: 06/14/19  8:50 AM   Specimen: Nasopharyngeal(NP) swabs in vial transport medium   NASOPHARYNGE  TESTING  Result Value Ref Range Status   SARS-CoV-2, NAA Detected (A) Not Detected Final    Comment: This nucleic acid amplification test was developed and its performance characteristics determined by [employer].  Nucleic acid amplification tests include PCR and TMA. This test has not been FDA cleared or approved. This test has been authorized by FDA under an Emergency Use Authorization (EUA). This test is only authorized for the duration of time the declaration that circumstances exist justifying the authorization of the emergency use of in vitro diagnostic tests for detection of SARS-CoV-2 virus and/or diagnosis of COVID-19 infection under section 564(b)(1) of the Act, 21 U.S.C. 086VHQ-4(O) (1), unless the authorization is terminated or revoked sooner. When diagnostic testing is negative, the possibility of a false negative result should be considered in the context of a patient's recent exposures and the presence of clinical signs and symptoms consistent with COVID-19. An individual without symptoms of COVID-19 and who is not shedding SARS-CoV-2 virus would  expect to have a negative (not detected) result in this assay.   Blood Culture (routine x 2)     Status: None (Preliminary result)   Collection Time: 06/21/19  5:13 PM   Specimen: BLOOD  Result Value Ref Range Status   Specimen Description BLOOD LEFT ANTECUBITAL  Final   Special Requests   Final    BOTTLES DRAWN AEROBIC AND ANAEROBIC Blood Culture results may not be optimal due to an inadequate volume of blood received in culture bottles   Culture   Final    NO GROWTH 2 DAYS Performed at [facility], [place] [address], [place], [place] [address]    Report Status PENDING  Incomplete  Blood Culture (routine x 2)     Status: None (Preliminary result)   Collection Time: 06/21/19  5:18 PM   Specimen: BLOOD  Result Value Ref Range Status   Specimen Description BLOOD SITE NOT SPECIFIED  Final   Special Requests   Final    BOTTLES DRAWN AEROBIC AND ANAEROBIC Blood Culture results may not be optimal due to an inadequate volume of blood received in culture bottles   Culture   Final    NO GROWTH 2 DAYS Performed at [facility], [place] [address], [place], [place] [address]    Report Status PENDING  Incomplete         Radiology Studies: CT ANGIO CHEST PE W OR WO CONTRAST  Result Date: 06/22/2019 CLINICAL DATA:  Shortness of breath, elevated D-dimer, COVID-19 positive. EXAM: CT  ANGIOGRAPHY CHEST WITH CONTRAST TECHNIQUE: Multidetector CT imaging of the chest was performed using the standard protocol during bolus administration of intravenous contrast. Multiplanar CT image reconstructions and MIPs were obtained to evaluate the vascular anatomy. CONTRAST:  OMNIPAQUE IOHEXOL 350 MG/ML SOLN COMPARISON:  Chest radiograph 06/21/2019. FINDINGS: Cardiovascular: Image quality is markedly degraded by respiratory motion and body habitus. Lobar, segmental and subsegmental pulmonary arteries therefore cannot be evaluated. No large central pulmonary embolus. Pulmonic trunk and heart are enlarged. No pericardial effusion. Mediastinum/Nodes: No pathologically enlarged mediastinal, hilar or axillary lymph nodes. Esophagus is grossly unremarkable. Lungs/Pleura: Marked image degradation by respiratory motion, expiratory phase imaging and body habitus. Extensive peribronchovascular and subpleural ground-glass opacification. No pleural fluid. Upper Abdomen: Image quality in the upper abdomen is markedly degraded by body habitus and respiratory motion. Visualized structures are grossly unremarkable. Musculoskeletal: Degenerative changes in the spine. Review of the MIP images confirms the above findings. IMPRESSION: 1. Image quality is markedly degraded by respiratory motion and body habitus, limiting the evaluation of the lobar, segmental and subsegmental pulmonary arteries. No large central pulmonary embolus. 2. Pulmonary parenchymal pattern of diffuse peribronchovascular and subpleural ground-glass is in keeping with moderate to severe COVID-19 pneumonia. 3. Enlarged pulmonic trunk, indicative of pulmonary arterial hypertension. Electronically  Signed   By: Leanna Battles M.D.   On: 06/22/2019 13:26   DG Chest Port 1 View  Result Date: 06/21/2019 CLINICAL DATA:  Shortness of breath. EXAM: PORTABLE CHEST 1 VIEW COMPARISON:  September 09, 2017 FINDINGS: Mild diffusely increased interstitial lung markings are noted. This is unchanged in appearance when compared to the prior study. No focal consolidation is identified. The heart size and mediastinal contours are within normal limits. There is tortuosity of the descending thoracic aorta. Degenerative changes seen throughout the thoracic spine. IMPRESSION: 1. Mild diffusely increased interstitial lung markings which are likely, in part, chronic in nature. Electronically Signed   By: Aram Candela M.D.   On: 06/21/2019 18:22        Scheduled Meds: . albuterol  2 puff Inhalation Q6H  . allopurinol  300 mg Oral Daily  . aspirin  325 mg Oral Daily  . dexamethasone (DECADRON) injection  6 mg Intravenous Q24H  . enoxaparin (LOVENOX) injection  70 mg Subcutaneous Q24H  . feeding supplement (PRO-STAT SUGAR FREE 64)  30 mL Oral BID  . insulin aspart  0-15 Units Subcutaneous Q4H  . insulin glargine  10 Units Subcutaneous Daily   Continuous Infusions: . remdesivir 100 mg in NS 100 mL Stopped (06/23/19 0956)     LOS: 2 days   The patient is critically ill with multiple organ systems failure and requires high complexity decision making for assessment and support, frequent evaluation and titration of therapies, application of advanced monitoring technologies and extensive interpretation of multiple databases. Critical Care Time devoted to patient care services described in this note  Time spent: 40 minutes     Marvelyn Bouchillon, Roselind Messier, MD [facility] Pager [phone]  If 7PM-7AM, please contact night-coverage [email or web address] Password [facility] 06/23/2019, 4:21 PM

## 2019-06-23 NOTE — Progress Notes (Signed)
Pt sitting in recliner. Tele SR. Pt on 10LHFNC and sats 93%. No complaints reported.VSS.

## 2019-06-24 LAB — CBC WITH DIFFERENTIAL/PLATELET
Abs Immature Granulocytes: 0.15 10*3/uL — ABNORMAL HIGH (ref 0.00–0.07)
Basophils Absolute: 0 10*3/uL (ref 0.0–0.1)
Basophils Relative: 0 %
Eosinophils Absolute: 0 10*3/uL (ref 0.0–0.5)
Eosinophils Relative: 0 %
HCT: 40.5 % (ref 36.0–46.0)
Hemoglobin: 12.6 g/dL (ref 12.0–15.0)
Immature Granulocytes: 2 %
Lymphocytes Relative: 11 %
Lymphs Abs: 1 10*3/uL (ref 0.7–4.0)
MCH: 27.3 pg (ref 26.0–34.0)
MCHC: 31.1 g/dL (ref 30.0–36.0)
MCV: 87.9 fL (ref 80.0–100.0)
Monocytes Absolute: 0.4 10*3/uL (ref 0.1–1.0)
Monocytes Relative: 5 %
Neutro Abs: 7.6 10*3/uL (ref 1.7–7.7)
Neutrophils Relative %: 82 %
Platelets: 456 10*3/uL — ABNORMAL HIGH (ref 150–400)
RBC: 4.61 MIL/uL (ref 3.87–5.11)
RDW: 16.5 % — ABNORMAL HIGH (ref 11.5–15.5)
WBC: 9.2 10*3/uL (ref 4.0–10.5)
nRBC: 0.4 % — ABNORMAL HIGH (ref 0.0–0.2)

## 2019-06-24 LAB — COMPREHENSIVE METABOLIC PANEL
ALT: 23 U/L (ref 0–44)
AST: 26 U/L (ref 15–41)
Albumin: 2.7 g/dL — ABNORMAL LOW (ref 3.5–5.0)
Alkaline Phosphatase: 78 U/L (ref 38–126)
Anion gap: 11 (ref 5–15)
BUN: 22 mg/dL (ref 8–23)
CO2: 25 mmol/L (ref 22–32)
Calcium: 8.9 mg/dL (ref 8.9–10.3)
Chloride: 103 mmol/L (ref 98–111)
Creatinine, Ser: 0.83 mg/dL (ref 0.44–1.00)
GFR calc Af Amer: >60 mL/min (ref >60)
GFR calc non Af Amer: >60 mL/min (ref >60)
Glucose, Bld: 230 mg/dL — ABNORMAL HIGH (ref 70–99)
Potassium: 4.1 mmol/L (ref 3.5–5.1)
Sodium: 139 mmol/L (ref 135–145)
Total Bilirubin: 0.4 mg/dL (ref 0.3–1.2)
Total Protein: 8.3 g/dL — ABNORMAL HIGH (ref 6.5–8.1)

## 2019-06-24 LAB — GLUCOSE, CAPILLARY
Glucose-Capillary: 171 mg/dL — ABNORMAL HIGH (ref 70–99)
Glucose-Capillary: 182 mg/dL — ABNORMAL HIGH (ref 70–99)
Glucose-Capillary: 195 mg/dL — ABNORMAL HIGH (ref 70–99)
Glucose-Capillary: 198 mg/dL — ABNORMAL HIGH (ref 70–99)
Glucose-Capillary: 230 mg/dL — ABNORMAL HIGH (ref 70–99)
Glucose-Capillary: 232 mg/dL — ABNORMAL HIGH (ref 70–99)
Glucose-Capillary: 259 mg/dL — ABNORMAL HIGH (ref 70–99)

## 2019-06-24 LAB — MAGNESIUM: Magnesium: 2.1 mg/dL (ref 1.7–2.4)

## 2019-06-24 LAB — C-REACTIVE PROTEIN: CRP: 9.5 mg/dL — ABNORMAL HIGH (ref <1.0)

## 2019-06-24 LAB — D-DIMER, QUANTITATIVE: D-Dimer, Quant: 1.6 ug/mL-FEU — ABNORMAL HIGH (ref 0.00–0.50)

## 2019-06-24 LAB — FERRITIN: Ferritin: 816 ng/mL — ABNORMAL HIGH (ref 11–307)

## 2019-06-24 LAB — PHOSPHORUS: Phosphorus: 1.7 mg/dL — ABNORMAL LOW (ref 2.5–4.6)

## 2019-06-24 MED ORDER — SODIUM PHOSPHATES 45 MMOLE/15ML IV SOLN
30.0000 mmol | Freq: Once | INTRAVENOUS | Status: AC
  Administered 2019-06-24: 15:00:00 30 mmol via INTRAVENOUS
  Filled 2019-06-24: qty 10

## 2019-06-24 NOTE — Consult Note (Signed)
WOC Nurse Consult Note: Patient receiving care in CGV 9130; patient is COVID +.  Consult completed remotely after review of record and images. Reason for Consult: BLE wounds, chronic Wound type: uncertain at this time.  The legs have so much dry, flaking, scaling skin it is difficult to determine if there are actual wounds under all the debris. Pressure Injury POA: Yes/No/NA Measurement: Wound bed: Drainage (amount, consistency, odor)  Periwound: Dressing procedure/placement/frequency: Wash BLE and feet with soapy water. Gently remove as much of the dry, flaking skin as possible.  Apply a THICK layer of Sween Moisturizer (pink and white tube in clean utility). Beginning behind the toes and going to just below the knees, wrap kerlex, then a 4 inch Ace Wrap. Perform daily. Monitor the wound area(s) for worsening of condition such as: Signs/symptoms of infection,  Increase in size,  Development of or worsening of odor, Development of pain, or increased pain at the affected locations.  Notify the medical team if any of these develop.  Thank you for the consult.  WOC nurse will not follow at this time.  Please re-consult the WOC team if needed.  Helmut Muster, RN, MSN, CWOCN, CNS-BC, pager [phone]

## 2019-06-24 NOTE — Progress Notes (Signed)
Pt agreed to bilat LE wound care and wrap. Pt tolerated well. Pt has requested to take wraps off at night to "air" wounds. Will pass to PM shift.

## 2019-06-24 NOTE — Progress Notes (Signed)
PROGRESS NOTE    [name]  QIO:[id number] DOB: 03/10/1951 DOA: 06/21/2019 PCP: Sonia Side., FNP   Brief Narrative:  69 y/o BF with PMHx anxiety, depressive disorder, TIA, obesity, asthma, diabetes type 2 uncontrolled with complication, HTN, HLD, chronic lower extremity wound   Presents to the ER with SOB, hypoxia and +covid test 06/14/2019. Reports symptoms of covid began a few days after christmas and worsened last night. Reports feeling SOB all last night. Reports decreased appetite but no n/v/d, chest pain. Has been using albuterol inhaler w/o relief. Was reportedly 80% on RA documented by EMS as well as CNA here in ED. Currently on 3 liters oxygen with normal sats.    Subjective: 1/8 afebrile overnight   last 24 hours afebrile A/O x4, negative CP, negative abdominal pain.  Positive S OB   Assessment & Plan:   Principal Problem:   COVID-19 Active Problems:   Hypertension   Uncontrolled diabetes mellitus type 2 with peripheral artery disease (HCC)   Gout   Adjustment disorder   Chronic skin ulcer of lower leg (HCC)   Severe protein-calorie malnutrition (HCC)   Acute respiratory failure with hypoxia (HCC)   Diabetes mellitus type 2, uncontrolled, with complications (HCC)   Anxiety   Depressive disorder   Essential hypertension   HLD (hyperlipidemia)   Morbidly obese (HCC)   Hypophosphatemia  Covid pneumonia/acute respiratory failure with hypoxia COVID-19 Labs  Recent Labs    06/21/19 1713 06/22/19 1305 06/23/19 0145 06/24/19 0220  DDIMER 3.08* 2.70* 2.11* 1.60*  FERRITIN 572* 470* 759* 816*  LDH 401*  --   --   --   CRP 20.5* 24.1* 20.6* 9.5*    Lab Results  Component Value Date   SARSCOV2NAA Detected (A) 06/14/2019  -Decadron 6 mg daily -Remdesivir per pharmacy protocol -1/6 Actemra x1 dose -CTA PE protocol negative for PE see results below  Essential HTN -BP controlled without medication monitor closely  PAD  Diabetes type 2  uncontrolled with complication -1/6 hemoglobin A1c= 7.7 -1/6 Lantus 10 units daily -Moderate SSI  HLD 1/6 LDL= 48   Severe protein calorie malnutrition -Encourage p.o. intake  -Prostat BID  Anxiety/Depressive Disorder  Chronic back pain/neuropathy/anxiety -Ativan 2 mg prior to CTA PE protocol  Lower extremity chronic wounds -1/6 wound care consult; recommendation.  Dressing procedure/placement/frequency: Wash BLE and feet with soapy water. Gently remove as much of the dry, flaking skin as possible.  Apply a THICK layer of Sween Moisturizer (pink and white tube in clean utility). Beginning behind the toes and going to just below the knees, wrap kerlex, then a 4 inch Ace Wrap. Perform daily. Monitor the wound area(s) for worsening of condition such as: Signs/symptoms of infection,   Morbidly obese (BMI 51.92 kg/m)  Hypophosphatemia -Sodium phosphate 30 mmol    DVT prophylaxis: Lovenox Code Status: Full Family Communication:  Disposition Plan: TBD   Consultants:    Procedures/Significant Events:  1/6 CTA chest PE protocol;-negative PE - Pulmonary parenchymal pattern of diffuse peribronchovascular and subpleural ground-glass is in keeping with moderate to severe COVID-19 pneumonia. 3. Enlarged pulmonic trunk, indicative of pulmonary arterial hypertension.   I have personally reviewed and interpreted all radiology studies and my findings are as above.  VENTILATOR SETTINGS: HFNC 1/8 Flow; 6 L/min SPO2; 96%   Cultures   Antimicrobials:    06/22/19 1600  remdesivir 100 mg in sodium chloride 0.9 % 100 mL IVPB     100 mg 200 mL/hr over 30 Minutes 06/26/19  5400   06/21/19 2000  remdesivir 200 mg in sodium chloride 0.9% 250 mL IVPB     200 mg 580 mL/hr over 30 Minutes 06/21/19 2240       Devices    LINES / TUBES:      Continuous Infusions: . remdesivir 100 mg in NS 100 mL 100 mg (06/24/19 0902)  . sodium phosphate  Dextrose 5% IVPB        Objective: Vitals:   06/24/19 1229 06/24/19 1230 06/24/19 1231 06/24/19 1232  BP:      Pulse: 82 83 85 80  Resp: (!) 26 (!) 26 (!) 24 (!) 24  Temp:      TempSrc:      SpO2: (!) 86% (!) 87% 100% 96%  Weight:      Height:        Intake/Output Summary (Last 24 hours) at 06/24/2019 1320 Last data filed at 06/24/2019 8676 Gross per 24 hour  Intake 600 ml  Output 1700 ml  Net -1100 ml   Filed Weights   06/21/19 1710  Weight: (!) 141.5 kg   Physical Exam:  General: A/O x4, positive acute respiratory distress Eyes: negative scleral hemorrhage, negative anisocoria, negative icterus ENT: Negative Runny nose, negative gingival bleeding, Neck:  Negative scars, masses, torticollis, lymphadenopathy, JVD Lungs: Tachypneic decreased breath sounds bilaterally without wheezes or crackles Cardiovascular: Regular rate and rhythm without murmur gallop or rub normal S1 and S2 Abdomen: MORBIDLY OBESE negative abdominal pain, nondistended, positive soft, bowel sounds, no rebound, no ascites, no appreciable mass Extremities: No significant cyanosis, clubbing, or edema bilateral lower extremities, chronic bilateral lower extremity ulceration/infection? Skin: Negative rashes, lesions, ulcers Psychiatric:  Negative depression, negative anxiety, negative fatigue, negative mania  Central nervous system:  Cranial nerves II through XII intact, tongue/uvula midline, all extremities muscle strength 5/5, sensation intact throughout, negative dysarthria, negative expressive aphasia, negative receptive aphasia.   .     Data Reviewed: Care during the described time interval was provided by me .  I have reviewed this patient's available data, including medical history, events of note, physical examination, and all test results as part of my evaluation.   CBC: Recent Labs  Lab 06/21/19 1713 06/22/19 0315 06/23/19 0145 06/24/19 0220  WBC 7.8 9.3 6.8 9.2  NEUTROABS 6.5 8.2* 5.5 7.6  HGB 13.2 12.6  12.7 12.6  HCT 41.9 41.0 41.0 40.5  MCV 88.0 88.2 88.2 87.9  PLT 261 261 344 456*   Basic Metabolic Panel: Recent Labs  Lab 06/21/19 1713 06/22/19 0315 06/22/19 1305 06/23/19 0145 06/24/19 0220  NA 135 139  --  136 139  K 3.8 4.1  --  4.1 4.1  CL 102 103  --  105 103  CO2 21* 23  --  18* 25  GLUCOSE 106* 196*  --  288* 230*  BUN 12 14  --  19 22  CREATININE 1.11* 0.92  --  0.94 0.83  CALCIUM 8.8* 8.7*  --  9.1 8.9  MG  --   --  2.0 2.1 2.1  PHOS  --   --  2.5 2.4* 1.7*   GFR: Estimated Creatinine Clearance: 90.4 mL/min (by C-G formula based on SCr of 0.83 mg/dL). Liver Function Tests: Recent Labs  Lab 06/21/19 1713 06/22/19 0315 06/23/19 0145 06/24/19 0220  AST 40 32 40 26  ALT 21 21 26 23   ALKPHOS 77 74 77 78  BILITOT 0.5 0.9 0.3 0.4  PROT 9.1* 8.6* 8.8* 8.3*  ALBUMIN 2.8* 3.0*  2.8* 2.7*   No results for input(s): LIPASE, AMYLASE in the last 168 hours. No results for input(s): AMMONIA in the last 168 hours. Coagulation Profile: No results for input(s): INR, PROTIME in the last 168 hours. Cardiac Enzymes: No results for input(s): CKTOTAL, CKMB, CKMBINDEX, TROPONINI in the last 168 hours. BNP (last 3 results) No results for input(s): PROBNP in the last 8760 hours. HbA1C: Recent Labs    06/22/19 1305  HGBA1C 7.7*   CBG: Recent Labs  Lab 06/23/19 1910 06/23/19 2338 06/24/19 0257 06/24/19 0732 06/24/19 1140  GLUCAP 286* 248* 232* 182* 195*   Lipid Profile: Recent Labs    06/21/19 1740 06/22/19 1305  CHOL  --  100  HDL  --  39*  LDLCALC  --  48  TRIG 85 64  CHOLHDL  --  2.6   Thyroid Function Tests: No results for input(s): TSH, T4TOTAL, FREET4, T3FREE, THYROIDAB in the last 72 hours. Anemia Panel: Recent Labs    06/23/19 0145 06/24/19 0220  FERRITIN 759* 816*   Urine analysis:    Component Value Date/Time   COLORURINE YELLOW 03/16/2017 2237   APPEARANCEUR CLEAR 03/16/2017 2237   LABSPEC 1.017 03/16/2017 2237   PHURINE 5.0  03/16/2017 2237   GLUCOSEU NEGATIVE 03/16/2017 2237   HGBUR NEGATIVE 03/16/2017 2237   BILIRUBINUR NEGATIVE 03/16/2017 2237   KETONESUR NEGATIVE 03/16/2017 2237   PROTEINUR 30 (A) 03/16/2017 2237   NITRITE NEGATIVE 03/16/2017 2237   LEUKOCYTESUR NEGATIVE 03/16/2017 2237   Sepsis Labs: @LABRCNTIP (procalcitonin:4,lacticidven:4)  ) Recent Results (from the past 240 hour(s))  Blood Culture (routine x 2)     Status: None (Preliminary result)   Collection Time: 06/21/19  5:13 PM   Specimen: BLOOD  Result Value Ref Range Status   Specimen Description BLOOD LEFT ANTECUBITAL  Final   Special Requests   Final    BOTTLES DRAWN AEROBIC AND ANAEROBIC Blood Culture results may not be optimal due to an inadequate volume of blood received in culture bottles   Culture   Final    NO GROWTH 3 DAYS Performed at [facility], [address] [place], [place]    Report Status PENDING  Incomplete  Blood Culture (routine x 2)     Status: None (Preliminary result)   Collection Time: 06/21/19  5:18 PM   Specimen: BLOOD  Result Value Ref Range Status   Specimen Description BLOOD SITE NOT SPECIFIED  Final   Special Requests   Final    BOTTLES DRAWN AEROBIC AND ANAEROBIC Blood Culture results may not be optimal due to an inadequate volume of blood received in culture bottles   Culture   Final    NO GROWTH 3 DAYS Performed at [facility], [address], [place], [place]    Report Status PENDING  Incomplete         Radiology Studies: No results found.      Scheduled Meds: . albuterol  2 puff Inhalation Q6H  . allopurinol  300 mg Oral Daily  . aspirin  325 mg Oral Daily  . dexamethasone (DECADRON) injection  6 mg Intravenous Q24H  . enoxaparin (LOVENOX) injection  70 mg Subcutaneous Q24H  . feeding supplement (PRO-STAT SUGAR FREE 64)  30 mL Oral BID  . insulin aspart  0-15 Units Subcutaneous Q4H  . insulin glargine  10 Units Subcutaneous Daily  .  polyethylene glycol  17 g Oral Daily   Continuous Infusions: . remdesivir 100 mg in NS  100 mL 100 mg (06/24/19 0902)  . sodium phosphate  Dextrose 5% IVPB       LOS: 3 days   The patient is critically ill with multiple organ systems failure and requires high complexity decision making for assessment and support, frequent evaluation and titration of therapies, application of advanced monitoring technologies and extensive interpretation of multiple databases. Critical Care Time devoted to patient care services described in this note  Time spent: 40 minutes     Phynix Horton, Roselind Messier, MD [facility] Pager [phone]  If 7PM-7AM, please contact night-coverage [email or web address] Password [email or web address] 06/24/2019, 1:20 PM

## 2019-06-25 LAB — CBC WITH DIFFERENTIAL/PLATELET
Abs Immature Granulocytes: 0.15 10*3/uL — ABNORMAL HIGH (ref 0.00–0.07)
Basophils Absolute: 0 10*3/uL (ref 0.0–0.1)
Basophils Relative: 1 %
Eosinophils Absolute: 0 10*3/uL (ref 0.0–0.5)
Eosinophils Relative: 0 %
HCT: 43.4 % (ref 36.0–46.0)
Hemoglobin: 13.4 g/dL (ref 12.0–15.0)
Immature Granulocytes: 2 %
Lymphocytes Relative: 18 %
Lymphs Abs: 1.3 10*3/uL (ref 0.7–4.0)
MCH: 27.1 pg (ref 26.0–34.0)
MCHC: 30.9 g/dL (ref 30.0–36.0)
MCV: 87.9 fL (ref 80.0–100.0)
Monocytes Absolute: 0.3 10*3/uL (ref 0.1–1.0)
Monocytes Relative: 5 %
Neutro Abs: 5.2 10*3/uL (ref 1.7–7.7)
Neutrophils Relative %: 74 %
Platelets: 523 10*3/uL — ABNORMAL HIGH (ref 150–400)
RBC: 4.94 MIL/uL (ref 3.87–5.11)
RDW: 16.5 % — ABNORMAL HIGH (ref 11.5–15.5)
WBC: 7 10*3/uL (ref 4.0–10.5)
nRBC: 1 % — ABNORMAL HIGH (ref 0.0–0.2)

## 2019-06-25 LAB — GLUCOSE, CAPILLARY
Glucose-Capillary: 158 mg/dL — ABNORMAL HIGH (ref 70–99)
Glucose-Capillary: 179 mg/dL — ABNORMAL HIGH (ref 70–99)
Glucose-Capillary: 205 mg/dL — ABNORMAL HIGH (ref 70–99)
Glucose-Capillary: 228 mg/dL — ABNORMAL HIGH (ref 70–99)
Glucose-Capillary: 299 mg/dL — ABNORMAL HIGH (ref 70–99)
Glucose-Capillary: 305 mg/dL — ABNORMAL HIGH (ref 70–99)

## 2019-06-25 LAB — COMPREHENSIVE METABOLIC PANEL
ALT: 46 U/L — ABNORMAL HIGH (ref 0–44)
AST: 52 U/L — ABNORMAL HIGH (ref 15–41)
Albumin: 2.7 g/dL — ABNORMAL LOW (ref 3.5–5.0)
Alkaline Phosphatase: 83 U/L (ref 38–126)
Anion gap: 10 (ref 5–15)
BUN: 23 mg/dL (ref 8–23)
CO2: 26 mmol/L (ref 22–32)
Calcium: 8.8 mg/dL — ABNORMAL LOW (ref 8.9–10.3)
Chloride: 101 mmol/L (ref 98–111)
Creatinine, Ser: 0.95 mg/dL (ref 0.44–1.00)
GFR calc Af Amer: >60 mL/min (ref >60)
GFR calc non Af Amer: >60 mL/min (ref >60)
Glucose, Bld: 275 mg/dL — ABNORMAL HIGH (ref 70–99)
Potassium: 4.5 mmol/L (ref 3.5–5.1)
Sodium: 137 mmol/L (ref 135–145)
Total Bilirubin: 0.3 mg/dL (ref 0.3–1.2)
Total Protein: 7.9 g/dL (ref 6.5–8.1)

## 2019-06-25 LAB — FERRITIN: Ferritin: 573 ng/mL — ABNORMAL HIGH (ref 11–307)

## 2019-06-25 LAB — MAGNESIUM: Magnesium: 2.1 mg/dL (ref 1.7–2.4)

## 2019-06-25 LAB — D-DIMER, QUANTITATIVE: D-Dimer, Quant: 1.87 ug/mL-FEU — ABNORMAL HIGH (ref 0.00–0.50)

## 2019-06-25 LAB — PHOSPHORUS: Phosphorus: 3.4 mg/dL (ref 2.5–4.6)

## 2019-06-25 LAB — C-REACTIVE PROTEIN: CRP: 4.4 mg/dL — ABNORMAL HIGH (ref <1.0)

## 2019-06-25 MED ORDER — INSULIN GLARGINE 100 UNIT/ML ~~LOC~~ SOLN
15.0000 [IU] | Freq: Every day | SUBCUTANEOUS | Status: DC
  Administered 2019-06-26: 08:00:00 15 [IU] via SUBCUTANEOUS
  Filled 2019-06-25: qty 0.15

## 2019-06-25 MED ORDER — INSULIN GLARGINE 100 UNIT/ML ~~LOC~~ SOLN
5.0000 [IU] | Freq: Once | SUBCUTANEOUS | Status: AC
  Administered 2019-06-25: 18:00:00 5 [IU] via SUBCUTANEOUS
  Filled 2019-06-25: qty 0.05

## 2019-06-25 MED ORDER — INSULIN ASPART 100 UNIT/ML ~~LOC~~ SOLN
3.0000 [IU] | Freq: Three times a day (TID) | SUBCUTANEOUS | Status: DC
  Administered 2019-06-25 – 2019-06-26 (×4): 3 [IU] via SUBCUTANEOUS

## 2019-06-25 NOTE — Progress Notes (Addendum)
PROGRESS NOTE    [name]  ZTI:[id number] DOB: Dec 03, 1950 DOA: 06/21/2019 PCP: Raymon Mutton., FNP   Brief Narrative:  69 y/o BF with PMHx anxiety, depressive disorder, TIA, obesity, asthma, diabetes type 2 uncontrolled with complication, HTN, HLD, chronic lower extremity wound   Presents to the ER with SOB, hypoxia and +covid test 06/14/2019. Reports symptoms of covid began a few days after christmas and worsened last night. Reports feeling SOB all last night. Reports decreased appetite but no n/v/d, chest pain. Has been using albuterol inhaler w/o relief. Was reportedly 80% on RA documented by EMS as well as CNA here in ED. Currently on 3 liters oxygen with normal sats.    Subjective: 1/9  afebrile overnight A/O x4, negative CP.  Positive S OB   Assessment & Plan:   Principal Problem:   COVID-19 Active Problems:   Hypertension   Uncontrolled diabetes mellitus type 2 with peripheral artery disease (HCC)   Gout   Adjustment disorder   Chronic skin ulcer of lower leg (HCC)   Severe protein-calorie malnutrition (HCC)   Acute respiratory failure with hypoxia (HCC)   Diabetes mellitus type 2, uncontrolled, with complications (HCC)   Anxiety   Depressive disorder   Essential hypertension   HLD (hyperlipidemia)   Morbidly obese (HCC)   Hypophosphatemia  Covid pneumonia/acute respiratory failure with hypoxia COVID-19 Labs  Recent Labs    06/23/19 0145 06/24/19 0220 06/25/19 0517  DDIMER 2.11* 1.60* 1.87*  FERRITIN 759* 816* 573*  CRP 20.6* 9.5* 4.4*    Lab Results  Component Value Date   SARSCOV2NAA Detected (A) 06/14/2019  -Decadron 6 mg daily -Remdesivir per pharmacy protocol -1/6 Actemra x1 dose -CTA PE protocol negative for PE see results below  Essential HTN -BP controlled without medication monitor closely  PAD  Diabetes type 2 uncontrolled with complication -1/6 hemoglobin A1c= 7.7 -1/9 increase Lantus 15 units daily -1/9 NovoLog 3 units  qac -Moderate SSI  HLD 1/6 LDL= 48   Severe protein calorie malnutrition -Encourage p.o. intake  -Prostat BID  Anxiety/Depressive Disorder  Chronic back pain/neuropathy/anxiety -Ativan 2 mg prior to CTA PE protocol  Lower extremity chronic wounds -1/6 wound care consult; recommendation.  Dressing procedure/placement/frequency: Wash BLE and feet with soapy water. Gently remove as much of the dry, flaking skin as possible.  Apply a THICK layer of Sween Moisturizer (pink and white tube in clean utility). Beginning behind the toes and going to just below the knees, wrap kerlex, then a 4 inch Ace Wrap. Perform daily. Monitor the wound area(s) for worsening of condition such as: Signs/symptoms of infection,   Morbidly obese (BMI 51.92 kg/m)  Hypophosphatemia -Sodium phosphate 30 mmol    DVT prophylaxis: Lovenox Code Status: Full Family Communication: 1/9 spoke with Franchot Gallo (husband) counseled on plan of care answered all questions Disposition Plan: TBD   Consultants:    Procedures/Significant Events:  1/6 CTA chest PE protocol;-negative PE - Pulmonary parenchymal pattern of diffuse peribronchovascular and subpleural ground-glass is in keeping with moderate to severe COVID-19 pneumonia. 3. Enlarged pulmonic trunk, indicative of pulmonary arterial hypertension.   I have personally reviewed and interpreted all radiology studies and my findings are as above.  VENTILATOR SETTINGS: HFNC 1/9 Flow; 8 L/min SPO2; 93%   Cultures   Antimicrobials:    06/22/19 1600  remdesivir 100 mg in sodium chloride 0.9 % 100 mL IVPB     100 mg 200 mL/hr over 30 Minutes 06/26/19 0959   06/21/19 2000  remdesivir  200 mg in sodium chloride 0.9% 250 mL IVPB     200 mg 580 mL/hr over 30 Minutes 06/21/19 2240       Devices    LINES / TUBES:      Continuous Infusions:    Objective: Vitals:   06/25/19 0728 06/25/19 0900 06/25/19 1612 06/25/19 1640  BP: (!) 142/96  134/77    Pulse: 75 74 80 81  Resp: (!) 27 20 (!) 27 (!) 21  Temp: 97.8 F (36.6 C)  97.7 F (36.5 C)   TempSrc: Oral  Oral   SpO2: 95% 94% 97% 93%  Weight:      Height:        Intake/Output Summary (Last 24 hours) at 06/25/2019 1656 Last data filed at 06/25/2019 0600 Gross per 24 hour  Intake 420 ml  Output 1200 ml  Net -780 ml   Filed Weights   06/21/19 1710  Weight: (!) 141.5 kg   Physical Exam:  General: A/O x4, positive acute respiratory distress Eyes: negative scleral hemorrhage, negative anisocoria, negative icterus ENT: Negative Runny nose, negative gingival bleeding, Neck:  Negative scars, masses, torticollis, lymphadenopathy, JVD Lungs: Decreased breath sounds bilaterally without wheezes or crackles Cardiovascular: Regular rate and rhythm without murmur gallop or rub normal S1 and S2 Abdomen: MORBIDLY OBESE negative abdominal pain, nondistended, positive soft, bowel sounds, no rebound, no ascites, no appreciable mass Extremities: No significant cyanosis, clubbing, or edema bilateral lower extremities, chronic bilateral lower extremity ulceration Skin: Negative rashes, lesions, ulcers Psychiatric:  Negative depression, negative anxiety, negative fatigue, negative mania  Central nervous system:  Cranial nerves II through XII intact, tongue/uvula midline, all extremities muscle strength 5/5, sensation intact throughout, negative dysarthria, negative expressive aphasia, negative receptive aphasia.   .     Data Reviewed: Care during the described time interval was provided by me .  I have reviewed this patient's available data, including medical history, events of note, physical examination, and all test results as part of my evaluation.   CBC: Recent Labs  Lab 06/21/19 1713 06/22/19 0315 06/23/19 0145 06/24/19 0220 06/25/19 0517  WBC 7.8 9.3 6.8 9.2 7.0  NEUTROABS 6.5 8.2* 5.5 7.6 5.2  HGB 13.2 12.6 12.7 12.6 13.4  HCT 41.9 41.0 41.0 40.5 43.4  MCV 88.0 88.2 88.2 87.9  87.9  PLT 261 261 344 456* 523*   Basic Metabolic Panel: Recent Labs  Lab 06/21/19 1713 06/22/19 0315 06/22/19 1305 06/23/19 0145 06/24/19 0220 06/25/19 0517  NA 135 139  --  136 139 137  K 3.8 4.1  --  4.1 4.1 4.5  CL 102 103  --  105 103 101  CO2 21* 23  --  18* 25 26  GLUCOSE 106* 196*  --  288* 230* 275*  BUN 12 14  --  19 22 23   CREATININE 1.11* 0.92  --  0.94 0.83 0.95  CALCIUM 8.8* 8.7*  --  9.1 8.9 8.8*  MG  --   --  2.0 2.1 2.1 2.1  PHOS  --   --  2.5 2.4* 1.7* 3.4   GFR: Estimated Creatinine Clearance: 79 mL/min (by C-G formula based on SCr of 0.95 mg/dL). Liver Function Tests: Recent Labs  Lab 06/21/19 1713 06/22/19 0315 06/23/19 0145 06/24/19 0220 06/25/19 0517  AST 40 32 40 26 52*  ALT 21 21 26 23  46*  ALKPHOS 77 74 77 78 83  BILITOT 0.5 0.9 0.3 0.4 0.3  PROT 9.1* 8.6* 8.8* 8.3* 7.9  ALBUMIN 2.8* 3.0*  2.8* 2.7* 2.7*   No results for input(s): LIPASE, AMYLASE in the last 168 hours. No results for input(s): AMMONIA in the last 168 hours. Coagulation Profile: No results for input(s): INR, PROTIME in the last 168 hours. Cardiac Enzymes: No results for input(s): CKTOTAL, CKMB, CKMBINDEX, TROPONINI in the last 168 hours. BNP (last 3 results) No results for input(s): PROBNP in the last 8760 hours. HbA1C: No results for input(s): HGBA1C in the last 72 hours. CBG: Recent Labs  Lab 06/24/19 2333 06/25/19 0356 06/25/19 0755 06/25/19 1158 06/25/19 1543  GLUCAP 259* 305* 205* 228* 179*   Lipid Profile: No results for input(s): CHOL, HDL, LDLCALC, TRIG, CHOLHDL, LDLDIRECT in the last 72 hours. Thyroid Function Tests: No results for input(s): TSH, T4TOTAL, FREET4, T3FREE, THYROIDAB in the last 72 hours. Anemia Panel: Recent Labs    06/24/19 0220 06/25/19 0517  FERRITIN 816* 573*   Urine analysis:    Component Value Date/Time   COLORURINE YELLOW 03/16/2017 2237   APPEARANCEUR CLEAR 03/16/2017 2237   LABSPEC 1.017 03/16/2017 2237   PHURINE 5.0  03/16/2017 2237   GLUCOSEU NEGATIVE 03/16/2017 2237   HGBUR NEGATIVE 03/16/2017 2237   BILIRUBINUR NEGATIVE 03/16/2017 2237   KETONESUR NEGATIVE 03/16/2017 2237   PROTEINUR 30 (A) 03/16/2017 2237   NITRITE NEGATIVE 03/16/2017 2237   LEUKOCYTESUR NEGATIVE 03/16/2017 2237   Sepsis Labs: @LABRCNTIP (procalcitonin:4,lacticidven:4)  ) Recent Results (from the past 240 hour(s))  Blood Culture (routine x 2)     Status: None (Preliminary result)   Collection Time: 06/21/19  5:13 PM   Specimen: BLOOD  Result Value Ref Range Status   Specimen Description BLOOD LEFT ANTECUBITAL  Final   Special Requests   Final    BOTTLES DRAWN AEROBIC AND ANAEROBIC Blood Culture results may not be optimal due to an inadequate volume of blood received in culture bottles   Culture   Final    NO GROWTH 4 DAYS Performed at [facility], [address], [place], [place]    Report Status PENDING  Incomplete  Blood Culture (routine x 2)     Status: None (Preliminary result)   Collection Time: 06/21/19  5:18 PM   Specimen: BLOOD  Result Value Ref Range Status   Specimen Description BLOOD SITE NOT SPECIFIED  Final   Special Requests   Final    BOTTLES DRAWN AEROBIC AND ANAEROBIC Blood Culture results may not be optimal due to an inadequate volume of blood received in culture bottles   Culture   Final    NO GROWTH 4 DAYS Performed at [facility], [address] [place], [place]    Report Status PENDING  Incomplete         Radiology Studies: No results found.      Scheduled Meds: . albuterol  2 puff Inhalation Q6H  . allopurinol  300 mg Oral Daily  . aspirin  325 mg Oral Daily  . dexamethasone (DECADRON) injection  6 mg Intravenous Q24H  . enoxaparin (LOVENOX) injection  70 mg Subcutaneous Q24H  . feeding supplement (PRO-STAT SUGAR FREE 64)  30 mL Oral BID  . insulin aspart  0-15 Units Subcutaneous Q4H  . insulin glargine  10 Units Subcutaneous Daily  .  polyethylene glycol  17 g Oral Daily   Continuous Infusions:    LOS: 4 days   The patient is critically ill with multiple organ systems failure and requires high complexity decision making for assessment and support, frequent evaluation and  titration of therapies, application of advanced monitoring technologies and extensive interpretation of multiple databases. Critical Care Time devoted to patient care services described in this note  Time spent: 40 minutes     Analeigha Nauman, Geraldo Docker, MD [facility] Pager [phone]  If 7PM-7AM, please contact night-coverage [email or web address] Password [email or web address] 06/25/2019, 4:56 PM

## 2019-06-25 NOTE — Plan of Care (Signed)

## 2019-06-26 LAB — COMPREHENSIVE METABOLIC PANEL
ALT: 39 U/L (ref 0–44)
AST: 30 U/L (ref 15–41)
Albumin: 2.6 g/dL — ABNORMAL LOW (ref 3.5–5.0)
Alkaline Phosphatase: 87 U/L (ref 38–126)
Anion gap: 9 (ref 5–15)
BUN: 28 mg/dL — ABNORMAL HIGH (ref 8–23)
CO2: 25 mmol/L (ref 22–32)
Calcium: 8.8 mg/dL — ABNORMAL LOW (ref 8.9–10.3)
Chloride: 102 mmol/L (ref 98–111)
Creatinine, Ser: 0.96 mg/dL (ref 0.44–1.00)
GFR calc Af Amer: >60 mL/min (ref >60)
GFR calc non Af Amer: 60 mL/min — ABNORMAL LOW (ref >60)
Glucose, Bld: 361 mg/dL — ABNORMAL HIGH (ref 70–99)
Potassium: 4.8 mmol/L (ref 3.5–5.1)
Sodium: 136 mmol/L (ref 135–145)
Total Bilirubin: 0.2 mg/dL — ABNORMAL LOW (ref 0.3–1.2)
Total Protein: 7.5 g/dL (ref 6.5–8.1)

## 2019-06-26 LAB — CBC WITH DIFFERENTIAL/PLATELET
Abs Immature Granulocytes: 0.23 10*3/uL — ABNORMAL HIGH (ref 0.00–0.07)
Basophils Absolute: 0 10*3/uL (ref 0.0–0.1)
Basophils Relative: 1 %
Eosinophils Absolute: 0 10*3/uL (ref 0.0–0.5)
Eosinophils Relative: 0 %
HCT: 42.6 % (ref 36.0–46.0)
Hemoglobin: 13.1 g/dL (ref 12.0–15.0)
Immature Granulocytes: 3 %
Lymphocytes Relative: 18 %
Lymphs Abs: 1.2 10*3/uL (ref 0.7–4.0)
MCH: 27.2 pg (ref 26.0–34.0)
MCHC: 30.8 g/dL (ref 30.0–36.0)
MCV: 88.4 fL (ref 80.0–100.0)
Monocytes Absolute: 0.3 10*3/uL (ref 0.1–1.0)
Monocytes Relative: 4 %
Neutro Abs: 4.9 10*3/uL (ref 1.7–7.7)
Neutrophils Relative %: 74 %
Platelets: 545 10*3/uL — ABNORMAL HIGH (ref 150–400)
RBC: 4.82 MIL/uL (ref 3.87–5.11)
RDW: 16.5 % — ABNORMAL HIGH (ref 11.5–15.5)
WBC: 6.7 10*3/uL (ref 4.0–10.5)
nRBC: 0.9 % — ABNORMAL HIGH (ref 0.0–0.2)

## 2019-06-26 LAB — PHOSPHORUS: Phosphorus: 3.3 mg/dL (ref 2.5–4.6)

## 2019-06-26 LAB — GLUCOSE, CAPILLARY
Glucose-Capillary: 310 mg/dL — ABNORMAL HIGH (ref 70–99)
Glucose-Capillary: 266 mg/dL — ABNORMAL HIGH (ref 70–99)
Glucose-Capillary: 230 mg/dL — ABNORMAL HIGH (ref 70–99)
Glucose-Capillary: 199 mg/dL — ABNORMAL HIGH (ref 70–99)
Glucose-Capillary: 271 mg/dL — ABNORMAL HIGH (ref 70–99)
Glucose-Capillary: 260 mg/dL — ABNORMAL HIGH (ref 70–99)

## 2019-06-26 LAB — CULTURE, BLOOD (ROUTINE X 2)
Culture: NO GROWTH
Culture: NO GROWTH

## 2019-06-26 LAB — MAGNESIUM: Magnesium: 2.1 mg/dL (ref 1.7–2.4)

## 2019-06-26 LAB — D-DIMER, QUANTITATIVE: D-Dimer, Quant: 2.58 ug/mL-FEU — ABNORMAL HIGH (ref 0.00–0.50)

## 2019-06-26 LAB — C-REACTIVE PROTEIN: CRP: 2.3 mg/dL — ABNORMAL HIGH (ref <1.0)

## 2019-06-26 LAB — FERRITIN: Ferritin: 287 ng/mL (ref 11–307)

## 2019-06-26 MED ORDER — INSULIN GLARGINE 100 UNIT/ML ~~LOC~~ SOLN
20.0000 [IU] | Freq: Every day | SUBCUTANEOUS | Status: DC
  Administered 2019-06-27: 09:00:00 20 [IU] via SUBCUTANEOUS
  Filled 2019-06-26: qty 0.2

## 2019-06-26 MED ORDER — INSULIN GLARGINE 100 UNIT/ML ~~LOC~~ SOLN
5.0000 [IU] | Freq: Once | SUBCUTANEOUS | Status: AC
  Administered 2019-06-26: 5 [IU] via SUBCUTANEOUS
  Filled 2019-06-26: qty 0.05

## 2019-06-26 MED ORDER — INSULIN ASPART 100 UNIT/ML ~~LOC~~ SOLN
8.0000 [IU] | Freq: Three times a day (TID) | SUBCUTANEOUS | Status: DC
  Administered 2019-06-26 – 2019-06-28 (×7): 8 [IU] via SUBCUTANEOUS

## 2019-06-26 NOTE — Progress Notes (Signed)
Pt is currently on 3L [place] sitting up in the chair. Pt refused wound care and does not want her bilateral lower extremities wrapped. So they were left open to air.

## 2019-06-26 NOTE — Progress Notes (Signed)
PROGRESS NOTE    [name]  GGY:[id number] DOB: Feb 10, 1951 DOA: 06/21/2019 PCP: Raymon Mutton., FNP   Brief Narrative:  69 y/o BF with PMHx anxiety, depressive disorder, TIA, obesity, asthma, diabetes type 2 uncontrolled with complication, HTN, HLD, chronic lower extremity wound   Presents to the ER with SOB, hypoxia and +covid test 06/14/2019. Reports symptoms of covid began a few days after christmas and worsened last night. Reports feeling SOB all last night. Reports decreased appetite but no n/v/d, chest pain. Has been using albuterol inhaler w/o relief. Was reportedly 80% on RA documented by EMS as well as CNA here in ED. Currently on 3 liters oxygen with normal sats.    Subjective: 1/10 afebrile overnight, A/O x4, negative CP.  Positive S OB.  In good spirits wants to know when she can be discharged    Assessment & Plan:   Principal Problem:   COVID-19 Active Problems:   Hypertension   Uncontrolled diabetes mellitus type 2 with peripheral artery disease (HCC)   Gout   Adjustment disorder   Chronic skin ulcer of lower leg (HCC)   Severe protein-calorie malnutrition (HCC)   Acute respiratory failure with hypoxia (HCC)   Diabetes mellitus type 2, uncontrolled, with complications (HCC)   Anxiety   Depressive disorder   Essential hypertension   HLD (hyperlipidemia)   Morbidly obese (HCC)   Hypophosphatemia  Covid pneumonia/acute respiratory failure with hypoxia COVID-19 Labs  Recent Labs    06/24/19 0220 06/25/19 0517 06/26/19 0256  DDIMER 1.60* 1.87* 2.58*  FERRITIN 816* 573* 287  CRP 9.5* 4.4* 2.3*    Lab Results  Component Value Date   SARSCOV2NAA Detected (A) 06/14/2019  -Decadron 6 mg daily -Remdesivir per pharmacy protocol -1/6 Actemra x1 dose -CTA PE protocol negative for PE see results below  Essential HTN -BP controlled without medication monitor closely  PAD  Diabetes type 2 uncontrolled with complication -1/6 hemoglobin A1c=  7.7 -1/10 increase Lantus 20 units daily -1/10 increase NovoLog 8 units qac -Moderate SSI  HLD 1/6 LDL= 48   Severe protein calorie malnutrition -Encourage p.o. intake  -Prostat BID  Anxiety/Depressive Disorder  Chronic back pain/neuropathy/anxiety -Ativan 2 mg prior to CTA PE protocol  Lower extremity chronic wounds -1/6 wound care consult; recommendation.  Dressing procedure/placement/frequency: Wash BLE and feet with soapy water. Gently remove as much of the dry, flaking skin as possible.  Apply a THICK layer of Sween Moisturizer (pink and white tube in clean utility). Beginning behind the toes and going to just below the knees, wrap kerlex, then a 4 inch Ace Wrap. Perform daily. Monitor the wound area(s) for worsening of condition such as: Signs/symptoms of infection,   Morbidly obese (BMI 51.92 kg/m)  Hypophosphatemia -Sodium phosphate 30 mmol    DVT prophylaxis: Lovenox Code Status: Full Family Communication: 1/9 spoke with Franchot Gallo (husband) counseled on plan of care answered all questions Disposition Plan: TBD   Consultants:    Procedures/Significant Events:  1/6 CTA chest PE protocol;-negative PE - Pulmonary parenchymal pattern of diffuse peribronchovascular and subpleural ground-glass is in keeping with moderate to severe COVID-19 pneumonia. 3. Enlarged pulmonic trunk, indicative of pulmonary arterial hypertension.   I have personally reviewed and interpreted all radiology studies and my findings are as above.  VENTILATOR SETTINGS: HFNC 1/10 Flow; 7 L/min SPO2; 94%   Cultures   Antimicrobials:    06/22/19 1600  remdesivir 100 mg in sodium chloride 0.9 % 100 mL IVPB     100 mg  200 mL/hr over 30 Minutes 06/26/19 0959   06/21/19 2000  remdesivir 200 mg in sodium chloride 0.9% 250 mL IVPB     200 mg 580 mL/hr over 30 Minutes 06/21/19 2240       Devices    LINES / TUBES:      Continuous Infusions:    Objective: Vitals:   06/25/19  2000 06/26/19 0431 06/26/19 0800 06/26/19 0900  BP: (!) 148/81  133/68   Pulse: 80  61 70  Resp: 16  (!) 26 (!) 22  Temp: 97.9 F (36.6 C) 97.7 F (36.5 C) (!) 97.2 F (36.2 C)   TempSrc: Oral Oral Axillary   SpO2: 95%  95% 97%  Weight:      Height:        Intake/Output Summary (Last 24 hours) at 06/26/2019 1227 Last data filed at 06/26/2019 0400 Gross per 24 hour  Intake 660 ml  Output 1300 ml  Net -640 ml   Filed Weights   06/21/19 1710  Weight: (!) 141.5 kg    Physical Exam:  General: A/O x4, positive acute respiratory distress Eyes: negative scleral hemorrhage, negative anisocoria, negative icterus ENT: Negative Runny nose, negative gingival bleeding, Neck:  Negative scars, masses, torticollis, lymphadenopathy, JVD Lungs: Tachypneic clear to auscultation bilaterally without wheezes or crackles Cardiovascular: Regular rate and rhythm without murmur gallop or rub normal S1 and S2 Abdomen: MORBIDLY OBESE negative abdominal pain, nondistended, positive soft, bowel sounds, no rebound, no ascites, no appreciable mass Extremities: No significant cyanosis, clubbing, or edema bilateral lower extremities, chronic bilateral lower extremity ulceration Skin: Negative rashes, lesions, ulcers Psychiatric:  Negative depression, negative anxiety, negative fatigue, negative mania  Central nervous system:  Cranial nerves II through XII intact, tongue/uvula midline, all extremities muscle strength 5/5, sensation intact throughout, negative dysarthria, negative expressive aphasia, negative receptive aphasia.  .     Data Reviewed: Care during the described time interval was provided by me .  I have reviewed this patient's available data, including medical history, events of note, physical examination, and all test results as part of my evaluation.   CBC: Recent Labs  Lab 06/22/19 0315 06/23/19 0145 06/24/19 0220 06/25/19 0517 06/26/19 0256  WBC 9.3 6.8 9.2 7.0 6.7  NEUTROABS 8.2*  5.5 7.6 5.2 4.9  HGB 12.6 12.7 12.6 13.4 13.1  HCT 41.0 41.0 40.5 43.4 42.6  MCV 88.2 88.2 87.9 87.9 88.4  PLT 261 344 456* 523* 545*   Basic Metabolic Panel: Recent Labs  Lab 06/22/19 0315 06/22/19 1305 06/23/19 0145 06/24/19 0220 06/25/19 0517 06/26/19 0256  NA 139  --  136 139 137 136  K 4.1  --  4.1 4.1 4.5 4.8  CL 103  --  105 103 101 102  CO2 23  --  18* 25 26 25   GLUCOSE 196*  --  288* 230* 275* 361*  BUN 14  --  19 22 23  28*  CREATININE 0.92  --  0.94 0.83 0.95 0.96  CALCIUM 8.7*  --  9.1 8.9 8.8* 8.8*  MG  --  2.0 2.1 2.1 2.1 2.1  PHOS  --  2.5 2.4* 1.7* 3.4 3.3   GFR: Estimated Creatinine Clearance: 78.2 mL/min (by C-G formula based on SCr of 0.96 mg/dL). Liver Function Tests: Recent Labs  Lab 06/22/19 0315 06/23/19 0145 06/24/19 0220 06/25/19 0517 06/26/19 0256  AST 32 40 26 52* 30  ALT 21 26 23  46* 39  ALKPHOS 74 77 78 83 87  BILITOT 0.9 0.3 0.4  0.3 0.2*  PROT 8.6* 8.8* 8.3* 7.9 7.5  ALBUMIN 3.0* 2.8* 2.7* 2.7* 2.6*   No results for input(s): LIPASE, AMYLASE in the last 168 hours. No results for input(s): AMMONIA in the last 168 hours. Coagulation Profile: No results for input(s): INR, PROTIME in the last 168 hours. Cardiac Enzymes: No results for input(s): CKTOTAL, CKMB, CKMBINDEX, TROPONINI in the last 168 hours. BNP (last 3 results) No results for input(s): PROBNP in the last 8760 hours. HbA1C: No results for input(s): HGBA1C in the last 72 hours. CBG: Recent Labs  Lab 06/25/19 1928 06/25/19 2347 06/26/19 0419 06/26/19 0716 06/26/19 1109  GLUCAP 158* 299* 310* 266* 230*   Lipid Profile: No results for input(s): CHOL, HDL, LDLCALC, TRIG, CHOLHDL, LDLDIRECT in the last 72 hours. Thyroid Function Tests: No results for input(s): TSH, T4TOTAL, FREET4, T3FREE, THYROIDAB in the last 72 hours. Anemia Panel: Recent Labs    06/25/19 0517 06/26/19 0256  FERRITIN 573* 287   Urine analysis:    Component Value Date/Time   COLORURINE YELLOW  03/16/2017 2237   APPEARANCEUR CLEAR 03/16/2017 2237   LABSPEC 1.017 03/16/2017 2237   PHURINE 5.0 03/16/2017 2237   GLUCOSEU NEGATIVE 03/16/2017 2237   HGBUR NEGATIVE 03/16/2017 2237   BILIRUBINUR NEGATIVE 03/16/2017 2237   KETONESUR NEGATIVE 03/16/2017 2237   PROTEINUR 30 (A) 03/16/2017 2237   NITRITE NEGATIVE 03/16/2017 2237   LEUKOCYTESUR NEGATIVE 03/16/2017 2237   Sepsis Labs: @LABRCNTIP (procalcitonin:4,lacticidven:4)  ) Recent Results (from the past 240 hour(s))  Blood Culture (routine x 2)     Status: None   Collection Time: 06/21/19  5:13 PM   Specimen: BLOOD  Result Value Ref Range Status   Specimen Description BLOOD LEFT ANTECUBITAL  Final   Special Requests   Final    BOTTLES DRAWN AEROBIC AND ANAEROBIC Blood Culture results may not be optimal due to an inadequate volume of blood received in culture bottles   Culture   Final    NO GROWTH 5 DAYS Performed at [facility], [address] [place], [place]    Report Status 06/26/2019 FINAL  Final  Blood Culture (routine x 2)     Status: None   Collection Time: 06/21/19  5:18 PM   Specimen: BLOOD  Result Value Ref Range Status   Specimen Description BLOOD SITE NOT SPECIFIED  Final   Special Requests   Final    BOTTLES DRAWN AEROBIC AND ANAEROBIC Blood Culture results may not be optimal due to an inadequate volume of blood received in culture bottles   Culture   Final    NO GROWTH 5 DAYS Performed at [facility], [address], [place], [place]    Report Status 06/26/2019 FINAL  Final         Radiology Studies: No results found.      Scheduled Meds: . albuterol  2 puff Inhalation Q6H  . allopurinol  300 mg Oral Daily  . aspirin  325 mg Oral Daily  . dexamethasone (DECADRON) injection  6 mg Intravenous Q24H  . enoxaparin (LOVENOX) injection  70 mg Subcutaneous Q24H  . feeding supplement (PRO-STAT SUGAR FREE 64)  30 mL Oral BID  . insulin aspart  0-15 Units  Subcutaneous Q4H  . insulin aspart  3 Units Subcutaneous TID WC  . insulin glargine  15 Units Subcutaneous Daily  . polyethylene glycol  17 g Oral Daily   Continuous Infusions:    LOS: 5 days   The patient  is critically ill with multiple organ systems failure and requires high complexity decision making for assessment and support, frequent evaluation and titration of therapies, application of advanced monitoring technologies and extensive interpretation of multiple databases. Critical Care Time devoted to patient care services described in this note  Time spent: 40 minutes     Ziah [name], Geraldo Docker, MD [facility] Pager [phone]  If 7PM-7AM, please contact night-coverage [email or web address] Password [facility] 06/26/2019, 12:27 PM

## 2019-06-26 NOTE — Plan of Care (Signed)
  Problem: Education: Goal: Knowledge of General Education information will improve Description: Including pain rating scale, medication(s)/side effects and non-pharmacologic comfort measures Outcome: Progressing   Problem: Health Behavior/Discharge Planning: Goal: Ability to manage health-related needs will improve Outcome: Progressing   Problem: Clinical Measurements: Goal: Respiratory complications will improve Outcome: Not Met (add Reason) Note: Pt does not want to participate in getting out of bed. Pt does not want wounds on her lower extremities wrapped.   Problem: Nutrition: Goal: Adequate nutrition will be maintained Outcome: Progressing   Problem: Coping: Goal: Level of anxiety will decrease Outcome: Progressing   Problem: Elimination: Goal: Will not experience complications related to bowel motility Outcome: Progressing Goal: Will not experience complications related to urinary retention Outcome: Progressing   Problem: Pain Managment: Goal: General experience of comfort will improve Outcome: Progressing   Problem: Safety: Goal: Ability to remain free from injury will improve Outcome: Progressing   Problem: Skin Integrity: Goal: Risk for impaired skin integrity will decrease Outcome: Not Met (add Reason)

## 2019-06-27 LAB — CBC WITH DIFFERENTIAL/PLATELET
Abs Immature Granulocytes: 0.18 10*3/uL — ABNORMAL HIGH (ref 0.00–0.07)
Basophils Absolute: 0 10*3/uL (ref 0.0–0.1)
Basophils Relative: 0 %
Eosinophils Absolute: 0 10*3/uL (ref 0.0–0.5)
Eosinophils Relative: 1 %
HCT: 42.9 % (ref 36.0–46.0)
Hemoglobin: 13.2 g/dL (ref 12.0–15.0)
Immature Granulocytes: 3 %
Lymphocytes Relative: 18 %
Lymphs Abs: 1.3 10*3/uL (ref 0.7–4.0)
MCH: 27.6 pg (ref 26.0–34.0)
MCHC: 30.8 g/dL (ref 30.0–36.0)
MCV: 89.7 fL (ref 80.0–100.0)
Monocytes Absolute: 0.3 10*3/uL (ref 0.1–1.0)
Monocytes Relative: 5 %
Neutro Abs: 5.4 10*3/uL (ref 1.7–7.7)
Neutrophils Relative %: 73 %
Platelets: 552 10*3/uL — ABNORMAL HIGH (ref 150–400)
RBC: 4.78 MIL/uL (ref 3.87–5.11)
RDW: 17.1 % — ABNORMAL HIGH (ref 11.5–15.5)
WBC: 7.3 10*3/uL (ref 4.0–10.5)
nRBC: 0.7 % — ABNORMAL HIGH (ref 0.0–0.2)

## 2019-06-27 LAB — COMPREHENSIVE METABOLIC PANEL
ALT: 32 U/L (ref 0–44)
AST: 24 U/L (ref 15–41)
Albumin: 2.8 g/dL — ABNORMAL LOW (ref 3.5–5.0)
Alkaline Phosphatase: 80 U/L (ref 38–126)
Anion gap: 9 (ref 5–15)
BUN: 30 mg/dL — ABNORMAL HIGH (ref 8–23)
CO2: 27 mmol/L (ref 22–32)
Calcium: 9.1 mg/dL (ref 8.9–10.3)
Chloride: 101 mmol/L (ref 98–111)
Creatinine, Ser: 1.08 mg/dL — ABNORMAL HIGH (ref 0.44–1.00)
GFR calc Af Amer: >60 mL/min (ref >60)
GFR calc non Af Amer: 52 mL/min — ABNORMAL LOW (ref >60)
Glucose, Bld: 327 mg/dL — ABNORMAL HIGH (ref 70–99)
Potassium: 5.2 mmol/L — ABNORMAL HIGH (ref 3.5–5.1)
Sodium: 137 mmol/L (ref 135–145)
Total Bilirubin: 0.3 mg/dL (ref 0.3–1.2)
Total Protein: 7.5 g/dL (ref 6.5–8.1)

## 2019-06-27 LAB — HEPATITIS B SURFACE ANTIGEN: Hepatitis B Surface Ag: NONREACTIVE

## 2019-06-27 LAB — GLUCOSE, CAPILLARY
Glucose-Capillary: 212 mg/dL — ABNORMAL HIGH (ref 70–99)
Glucose-Capillary: 215 mg/dL — ABNORMAL HIGH (ref 70–99)
Glucose-Capillary: 242 mg/dL — ABNORMAL HIGH (ref 70–99)
Glucose-Capillary: 244 mg/dL — ABNORMAL HIGH (ref 70–99)
Glucose-Capillary: 257 mg/dL — ABNORMAL HIGH (ref 70–99)
Glucose-Capillary: 294 mg/dL — ABNORMAL HIGH (ref 70–99)

## 2019-06-27 LAB — MAGNESIUM: Magnesium: 2.2 mg/dL (ref 1.7–2.4)

## 2019-06-27 LAB — D-DIMER, QUANTITATIVE: D-Dimer, Quant: 1.93 ug/mL-FEU — ABNORMAL HIGH (ref 0.00–0.50)

## 2019-06-27 LAB — FERRITIN: Ferritin: 240 ng/mL (ref 11–307)

## 2019-06-27 LAB — C-REACTIVE PROTEIN: CRP: 1.2 mg/dL — ABNORMAL HIGH (ref <1.0)

## 2019-06-27 LAB — PHOSPHORUS: Phosphorus: 3.6 mg/dL (ref 2.5–4.6)

## 2019-06-27 MED ORDER — INSULIN DETEMIR 100 UNIT/ML ~~LOC~~ SOLN
14.0000 [IU] | Freq: Two times a day (BID) | SUBCUTANEOUS | Status: DC
  Administered 2019-06-27 – 2019-06-29 (×4): 14 [IU] via SUBCUTANEOUS
  Filled 2019-06-27 (×5): qty 0.14

## 2019-06-27 MED ORDER — SODIUM ZIRCONIUM CYCLOSILICATE 10 G PO PACK
10.0000 g | PACK | Freq: Three times a day (TID) | ORAL | Status: AC
  Administered 2019-06-27 (×3): 10 g via ORAL
  Filled 2019-06-27 (×3): qty 1

## 2019-06-27 NOTE — Plan of Care (Signed)

## 2019-06-27 NOTE — Progress Notes (Addendum)
PROGRESS NOTE    [name]  MGQ:[id number] DOB: March 06, 1951 DOA: 06/21/2019 PCP: Raymon Mutton., FNP   Brief Narrative:  69 y/o BF with PMHx anxiety, depressive disorder, TIA, obesity, asthma, diabetes type 2 uncontrolled with complication, HTN, HLD, chronic lower extremity wound   Presents to the ER with SOB, hypoxia and +covid test 06/14/2019. Reports symptoms of covid began a few days after christmas and worsened last night. Reports feeling SOB all last night. Reports decreased appetite but no n/v/d, chest pain. Has been using albuterol inhaler w/o relief. Was reportedly 80% on RA documented by EMS as well as CNA here in ED. Currently on 3 liters oxygen with normal sats.    Subjective: 1/11 afebrile last 24 hours states Husband Covid positive.  Uses motorized chair, has not ambulated in years.   Assessment & Plan:   Principal Problem:   COVID-19 Active Problems:   Hypertension   Uncontrolled diabetes mellitus type 2 with peripheral artery disease (HCC)   Gout   Adjustment disorder   Chronic skin ulcer of lower leg (HCC)   Severe protein-calorie malnutrition (HCC)   Acute respiratory failure with hypoxia (HCC)   Diabetes mellitus type 2, uncontrolled, with complications (HCC)   Anxiety   Depressive disorder   Essential hypertension   HLD (hyperlipidemia)   Morbidly obese (HCC)   Hypophosphatemia  Covid pneumonia/acute respiratory failure with hypoxia COVID-19 Labs  Recent Labs    06/25/19 0517 06/26/19 0256 06/27/19 0510  DDIMER 1.87* 2.58* 1.93*  FERRITIN 573* 287 240  CRP 4.4* 2.3* 1.2*    Lab Results  Component Value Date   SARSCOV2NAA Detected (A) 06/14/2019  -Decadron 6 mg daily -Remdesivir completed -1/6 Actemra x1 dose -CTA PE protocol negative for PE see results below  Essential HTN -BP controlled without medication monitor closely  PAD  Diabetes type 2 uncontrolled with complication -1/6 hemoglobin A1c= 7.7 -1/10 increase NovoLog 8  units qac -1/11 change to Levemir 14 units BID -Moderate SSI  HLD 1/6 LDL= 48   Severe protein calorie malnutrition -Encourage p.o. intake  -Prostat BID  Anxiety/Depressive Disorder  Chronic back pain/neuropathy/anxiety -Ativan 2 mg prior to CTA PE protocol  Lower extremity chronic wounds -1/6 wound care consult; recommendation.  Dressing procedure/placement/frequency: Wash BLE and feet with soapy water. Gently remove as much of the dry, flaking skin as possible.  Apply a THICK layer of Sween Moisturizer (pink and white tube in clean utility). Beginning behind the toes and going to just below the knees, wrap kerlex, then a 4 inch Ace Wrap. Perform daily. Monitor the wound area(s) for worsening of condition such as: Signs/symptoms of infection,   Morbidly obese (BMI 51.92 kg/m)  Hypophosphatemia -Resolved  Hyperkalemia -Lokelma 10 g x 3 doses    DVT prophylaxis: Lovenox Code Status: Full Family Communication: 1/10 spoke with Ezekiel (husband) counseled on plan of care answered all questions Disposition Plan: TBD   Consultants:    Procedures/Significant Events:  1/6 CTA chest PE protocol;-negative PE - Pulmonary parenchymal pattern of diffuse peribronchovascular and subpleural ground-glass is in keeping with moderate to severe COVID-19 pneumonia. 3. Enlarged pulmonic trunk, indicative of pulmonary arterial hypertension.   I have personally reviewed and interpreted all radiology studies and my findings are as above.  VENTILATOR SETTINGS: Nasal cannula 1/11 Flow; 4 L/min SPO2; 97%   Cultures   Antimicrobials:    06/22/19 1600  remdesivir 100 mg in sodium chloride 0.9 % 100 mL IVPB     100 mg 200  mL/hr over 30 Minutes 06/26/19 0959   06/21/19 2000  remdesivir 200 mg in sodium chloride 0.9% 250 mL IVPB     200 mg 580 mL/hr over 30 Minutes 06/21/19 2240       Devices    LINES / TUBES:      Continuous Infusions:    Objective: Vitals:    06/26/19 1655 06/26/19 2000 06/27/19 0359 06/27/19 0823  BP:  (!) 139/94 (!) 150/79 (!) 142/74  Pulse: 85 86 85 (!) 57  Resp: (!) 24 (!) 22 (!) 23 17  Temp: (!) 97.2 F (36.2 C) 97.7 F (36.5 C) 97.7 F (36.5 C) 97.7 F (36.5 C)  TempSrc: Oral Oral Axillary Oral  SpO2: 95% 96% 94% 97%  Weight:      Height:        Intake/Output Summary (Last 24 hours) at 06/27/2019 0847 Last data filed at 06/26/2019 1356 Gross per 24 hour  Intake --  Output 300 ml  Net -300 ml   Filed Weights   06/21/19 1710  Weight: (!) 141.5 kg   Physical Exam:  General: A/O x4, no acute respiratory distress Eyes: negative scleral hemorrhage, negative anisocoria, negative icterus ENT: Negative Runny nose, negative gingival bleeding, Neck:  Negative scars, masses, torticollis, lymphadenopathy, JVD Lungs: Clear to auscultation bilaterally without wheezes or crackles Cardiovascular: Regular rate and rhythm without murmur gallop or rub normal S1 and S2 Abdomen: MORBIDLY OBESE negative abdominal pain, nondistended, positive soft, bowel sounds, no rebound, no ascites, no appreciable mass Extremities: No significant cyanosis, clubbing, or edema bilateral lower extremities Skin: Negative rashes, lesions, ulcers Psychiatric:  Negative depression, negative anxiety, negative fatigue, negative mania  Central nervous system:  Cranial nerves II through XII intact, tongue/uvula midline, all extremities muscle strength 5/5, sensation intact throughout, negative dysarthria, negative expressive aphasia, negative receptive aphasia.   .     Data Reviewed: Care during the described time interval was provided by me .  I have reviewed this patient's available data, including medical history, events of note, physical examination, and all test results as part of my evaluation.   CBC: Recent Labs  Lab 06/23/19 0145 06/24/19 0220 06/25/19 0517 06/26/19 0256 06/27/19 0510  WBC 6.8 9.2 7.0 6.7 7.3  NEUTROABS 5.5 7.6 5.2 4.9  5.4  HGB 12.7 12.6 13.4 13.1 13.2  HCT 41.0 40.5 43.4 42.6 42.9  MCV 88.2 87.9 87.9 88.4 89.7  PLT 344 456* 523* 545* 552*   Basic Metabolic Panel: Recent Labs  Lab 06/23/19 0145 06/24/19 0220 06/25/19 0517 06/26/19 0256 06/27/19 0510  NA 136 139 137 136 137  K 4.1 4.1 4.5 4.8 5.2*  CL 105 103 101 102 101  CO2 18* 25 26 25 27   GLUCOSE 288* 230* 275* 361* 327*  BUN 19 22 23  28* 30*  CREATININE 0.94 0.83 0.95 0.96 1.08*  CALCIUM 9.1 8.9 8.8* 8.8* 9.1  MG 2.1 2.1 2.1 2.1 2.2  PHOS 2.4* 1.7* 3.4 3.3 3.6   GFR: Estimated Creatinine Clearance: 69.5 mL/min (A) (by C-G formula based on SCr of 1.08 mg/dL (H)). Liver Function Tests: Recent Labs  Lab 06/23/19 0145 06/24/19 0220 06/25/19 0517 06/26/19 0256 06/27/19 0510  AST 40 26 52* 30 24  ALT 26 23 46* 39 32  ALKPHOS 77 78 83 87 80  BILITOT 0.3 0.4 0.3 0.2* 0.3  PROT 8.8* 8.3* 7.9 7.5 7.5  ALBUMIN 2.8* 2.7* 2.7* 2.6* 2.8*   No results for input(s): LIPASE, AMYLASE in the last 168 hours. No results  for input(s): AMMONIA in the last 168 hours. Coagulation Profile: No results for input(s): INR, PROTIME in the last 168 hours. Cardiac Enzymes: No results for input(s): CKTOTAL, CKMB, CKMBINDEX, TROPONINI in the last 168 hours. BNP (last 3 results) No results for input(s): PROBNP in the last 8760 hours. HbA1C: No results for input(s): HGBA1C in the last 72 hours. CBG: Recent Labs  Lab 06/26/19 1109 06/26/19 1531 06/26/19 2006 06/26/19 2105 06/27/19 0036  GLUCAP 230* 199* 271* 260* 242*   Lipid Profile: No results for input(s): CHOL, HDL, LDLCALC, TRIG, CHOLHDL, LDLDIRECT in the last 72 hours. Thyroid Function Tests: No results for input(s): TSH, T4TOTAL, FREET4, T3FREE, THYROIDAB in the last 72 hours. Anemia Panel: Recent Labs    06/26/19 0256 06/27/19 0510  FERRITIN 287 240   Urine analysis:    Component Value Date/Time   COLORURINE YELLOW 03/16/2017 2237   APPEARANCEUR CLEAR 03/16/2017 2237   LABSPEC  1.017 03/16/2017 2237   PHURINE 5.0 03/16/2017 2237   GLUCOSEU NEGATIVE 03/16/2017 2237   HGBUR NEGATIVE 03/16/2017 2237   [place] NEGATIVE 03/16/2017 [place] 03/16/2017 2237   PROTEINUR 30 (A) 03/16/2017 2237   NITRITE NEGATIVE 03/16/2017 2237   LEUKOCYTESUR NEGATIVE 03/16/2017 2237   Sepsis Labs: @LABRCNTIP (procalcitonin:4,lacticidven:4)  ) Recent Results (from the past 240 hour(s))  Blood Culture (routine x 2)     Status: None   Collection Time: 06/21/19  5:13 PM   Specimen: BLOOD  Result Value Ref Range Status   Specimen Description BLOOD LEFT ANTECUBITAL  Final   Special Requests   Final    BOTTLES DRAWN AEROBIC AND ANAEROBIC Blood Culture results may not be optimal due to an inadequate volume of blood received in culture bottles   Culture   Final    NO GROWTH 5 DAYS Performed at [facility], [place] [address] [place], [place] [address]    Report Status 06/26/2019 FINAL  Final  Blood Culture (routine x 2)     Status: None   Collection Time: 06/21/19  5:18 PM   Specimen: BLOOD  Result Value Ref Range Status   Specimen Description BLOOD SITE NOT SPECIFIED  Final   Special Requests   Final    BOTTLES DRAWN AEROBIC AND ANAEROBIC Blood Culture results may not be optimal due to an inadequate volume of blood received in culture bottles   Culture   Final    NO GROWTH 5 DAYS Performed at [facility], [place] [address] [place], [place] [address]    Report Status 06/26/2019 FINAL  Final         Radiology Studies: No results found.      Scheduled Meds: . albuterol  2 puff Inhalation Q6H  . allopurinol  300 mg Oral Daily  . aspirin  325 mg Oral Daily  . dexamethasone (DECADRON) injection  6 mg Intravenous Q24H  . enoxaparin (LOVENOX) injection  70 mg Subcutaneous Q24H  . feeding supplement (PRO-STAT SUGAR FREE 64)  30 mL Oral BID  . insulin aspart  0-15 Units Subcutaneous Q4H  . insulin aspart  8 Units Subcutaneous TID WC  . insulin  glargine  20 Units Subcutaneous Daily  . polyethylene glycol  17 g Oral Daily   Continuous Infusions:    LOS: 6 days   The patient is critically ill with multiple organ systems failure and requires high complexity decision making for assessment and support, frequent evaluation and titration of therapies, application of advanced monitoring technologies and extensive  interpretation of multiple databases. Critical Care Time devoted to patient care services described in this note  Time spent: 40 minutes     Kalden Wanke, Roselind Messier, MD [facility] Pager [phone]  If 7PM-7AM, please contact night-coverage [email or web address] Password [facility] 06/27/2019, 8:47 AM

## 2019-06-27 NOTE — Progress Notes (Signed)
Inpatient Diabetes Program Recommendations  AACE/ADA: New Consensus Statement on Inpatient Glycemic Control (2015)  Target Ranges:  Prepandial:   less than 140 mg/dL      Peak postprandial:   less than 180 mg/dL (1-2 hours)      Critically ill patients:  140 - 180 mg/dL   Lab Results  Component Value Date   GLUCAP 215 (H) 06/27/2019   HGBA1C 7.7 (H) 06/22/2019    Review of Glycemic Control Results for [name], [name] (MRN [id number]) as of 06/27/2019 12:46  Ref. Range 06/27/2019 00:36 06/27/2019 03:57 06/27/2019 08:45 06/27/2019 12:16  Glucose-Capillary Latest Ref Range: 70 - 99 mg/dL 585 (H) 929 (H) 244 (H) 215 (H)   Diabetes history: DM 2 Outpatient Diabetes medications: Tresiba 75 units daily, Novolog 17 units bid with meals Current orders for Inpatient glycemic control:  Decadron 6 g daily Novolog moderate q 4 hours Lantus 20 units daily Novolog 8 units tid with meals Inpatient Diabetes Program Recommendations:    Note patient was on Tresiba 75 units daily prior to admit.  Please increase Lantus to 38 units daily (1/2 of home dose).    Thanks  Beryl Meager, RN, BC-ADM Inpatient Diabetes Coordinator Pager [phone] (8a-5p)

## 2019-06-28 LAB — COMPREHENSIVE METABOLIC PANEL
ALT: 29 U/L (ref 0–44)
AST: 18 U/L (ref 15–41)
Albumin: 3 g/dL — ABNORMAL LOW (ref 3.5–5.0)
Alkaline Phosphatase: 84 U/L (ref 38–126)
Anion gap: 11 (ref 5–15)
BUN: 30 mg/dL — ABNORMAL HIGH (ref 8–23)
CO2: 26 mmol/L (ref 22–32)
Calcium: 9.2 mg/dL (ref 8.9–10.3)
Chloride: 102 mmol/L (ref 98–111)
Creatinine, Ser: 0.9 mg/dL (ref 0.44–1.00)
GFR calc Af Amer: >60 mL/min (ref >60)
GFR calc non Af Amer: >60 mL/min (ref >60)
Glucose, Bld: 236 mg/dL — ABNORMAL HIGH (ref 70–99)
Potassium: 4.2 mmol/L (ref 3.5–5.1)
Sodium: 139 mmol/L (ref 135–145)
Total Bilirubin: 0.4 mg/dL (ref 0.3–1.2)
Total Protein: 7.5 g/dL (ref 6.5–8.1)

## 2019-06-28 LAB — CBC WITH DIFFERENTIAL/PLATELET
Abs Immature Granulocytes: 0.27 10*3/uL — ABNORMAL HIGH (ref 0.00–0.07)
Basophils Absolute: 0 10*3/uL (ref 0.0–0.1)
Basophils Relative: 0 %
Eosinophils Absolute: 0 10*3/uL (ref 0.0–0.5)
Eosinophils Relative: 0 %
HCT: 43.2 % (ref 36.0–46.0)
Hemoglobin: 13.4 g/dL (ref 12.0–15.0)
Immature Granulocytes: 3 %
Lymphocytes Relative: 16 %
Lymphs Abs: 1.7 10*3/uL (ref 0.7–4.0)
MCH: 27.7 pg (ref 26.0–34.0)
MCHC: 31 g/dL (ref 30.0–36.0)
MCV: 89.4 fL (ref 80.0–100.0)
Monocytes Absolute: 0.7 10*3/uL (ref 0.1–1.0)
Monocytes Relative: 6 %
Neutro Abs: 7.9 10*3/uL — ABNORMAL HIGH (ref 1.7–7.7)
Neutrophils Relative %: 75 %
Platelets: 595 10*3/uL — ABNORMAL HIGH (ref 150–400)
RBC: 4.83 MIL/uL (ref 3.87–5.11)
RDW: 17.6 % — ABNORMAL HIGH (ref 11.5–15.5)
WBC: 10.6 10*3/uL — ABNORMAL HIGH (ref 4.0–10.5)
nRBC: 0.3 % — ABNORMAL HIGH (ref 0.0–0.2)

## 2019-06-28 LAB — CREATININE, SERUM
Creatinine, Ser: 0.93 mg/dL (ref 0.44–1.00)
GFR calc Af Amer: >60 mL/min (ref >60)
GFR calc non Af Amer: >60 mL/min (ref >60)

## 2019-06-28 LAB — GLUCOSE, CAPILLARY
Glucose-Capillary: 202 mg/dL — ABNORMAL HIGH (ref 70–99)
Glucose-Capillary: 205 mg/dL — ABNORMAL HIGH (ref 70–99)
Glucose-Capillary: 238 mg/dL — ABNORMAL HIGH (ref 70–99)
Glucose-Capillary: 289 mg/dL — ABNORMAL HIGH (ref 70–99)
Glucose-Capillary: 295 mg/dL — ABNORMAL HIGH (ref 70–99)
Glucose-Capillary: 327 mg/dL — ABNORMAL HIGH (ref 70–99)

## 2019-06-28 LAB — MAGNESIUM: Magnesium: 2.1 mg/dL (ref 1.7–2.4)

## 2019-06-28 LAB — PHOSPHORUS: Phosphorus: 3.7 mg/dL (ref 2.5–4.6)

## 2019-06-28 LAB — FERRITIN: Ferritin: 240 ng/mL (ref 11–307)

## 2019-06-28 LAB — C-REACTIVE PROTEIN: CRP: 0.9 mg/dL (ref <1.0)

## 2019-06-28 LAB — D-DIMER, QUANTITATIVE: D-Dimer, Quant: 1.24 ug/mL-FEU — ABNORMAL HIGH (ref 0.00–0.50)

## 2019-06-28 MED ORDER — INSULIN ASPART 100 UNIT/ML ~~LOC~~ SOLN
15.0000 [IU] | Freq: Three times a day (TID) | SUBCUTANEOUS | Status: DC
  Administered 2019-06-29 (×2): 15 [IU] via SUBCUTANEOUS

## 2019-06-28 MED ORDER — BACITRACIN-NEOMYCIN-POLYMYXIN OINTMENT TUBE
TOPICAL_OINTMENT | Freq: Every day | CUTANEOUS | Status: DC
  Filled 2019-06-28: qty 14.17

## 2019-06-28 NOTE — Progress Notes (Signed)
Inpatient Diabetes Program Recommendations  AACE/ADA: New Consensus Statement on Inpatient Glycemic Control (2015)  Target Ranges:  Prepandial:   less than 140 mg/dL      Peak postprandial:   less than 180 mg/dL (1-2 hours)      Critically ill patients:  140 - 180 mg/dL   Lab Results  Component Value Date   GLUCAP 238 (H) 06/28/2019   HGBA1C 7.7 (H) 06/22/2019    Review of Glycemic Control Results for [name], [name] (MRN [id number]) as of 06/28/2019 13:57  Ref. Range 06/27/2019 20:48 06/28/2019 00:31 06/28/2019 04:41 06/28/2019 07:59 06/28/2019 11:03  Glucose-Capillary Latest Ref Range: 70 - 99 mg/dL 888 (H) 280 (H) 034 (H) 289 (H) 238 (H)  Diabetes history: DM 2 Outpatient Diabetes medications: Tresiba 75 units daily, Novolog 17 units bid with meals Current orders for Inpatient glycemic control:  Decadron 6 g daily Novolog moderate q 4 hours Levemir 14 units bid Novolog 8 units tid with meals Inpatient Diabetes Program Recommendations:   Please consider increasing Levemir to 20 units bid.   Thanks,  Beryl Meager, RN, BC-ADM Inpatient Diabetes Coordinator Pager [phone] (8a-5p)

## 2019-06-28 NOTE — Plan of Care (Signed)

## 2019-06-28 NOTE — Progress Notes (Addendum)
PROGRESS NOTE    [name]  MBT:[id number] DOB: [birth year] DOA: 06/21/2019 PCP: Raymon Mutton., FNP   Brief Narrative:  69 y/o BF with PMHx anxiety, depressive disorder, TIA, obesity, asthma, diabetes type 2 uncontrolled with complication, HTN, HLD, chronic lower extremity wound   Presents to the ER with SOB, hypoxia and +covid test 06/14/2019. Reports symptoms of covid began a few days after christmas and worsened last night. Reports feeling SOB all last night. Reports decreased appetite but no n/v/d, chest pain. Has been using albuterol inhaler w/o relief. Was reportedly 80% on RA documented by EMS as well as CNA here in ED. Currently on 3 liters oxygen with normal sats.    Subjective: 1/12 afebrile last 24 hours.  RN overnight reports foul order from bilateral lower extremities when changing bandages plus a discharge. Husband Covid positive.   Assessment & Plan:   Principal Problem:   COVID-19 Active Problems:   Hypertension   Uncontrolled diabetes mellitus type 2 with peripheral artery disease (HCC)   Gout   Adjustment disorder   Chronic skin ulcer of lower leg (HCC)   Severe protein-calorie malnutrition (HCC)   Acute respiratory failure with hypoxia (HCC)   Diabetes mellitus type 2, uncontrolled, with complications (HCC)   Anxiety   Depressive disorder   Essential hypertension   HLD (hyperlipidemia)   Morbidly obese (HCC)   Hypophosphatemia  Covid pneumonia/acute respiratory failure with hypoxia COVID-19 Labs  Recent Labs    06/26/19 0256 06/27/19 0510 06/28/19 0440  DDIMER 2.58* 1.93* 1.24*  FERRITIN 287 240 240  CRP 2.3* 1.2* 0.9    Lab Results  Component Value Date   SARSCOV2NAA Detected (A) 06/14/2019  -Decadron 6 mg daily -Remdesivir completed -1/6 Actemra x1 dose -CTA PE protocol negative for PE see results below  Essential HTN -BP controlled without medication monitor closely  PAD  Diabetes type 2 uncontrolled with complication -1/6  hemoglobin A1c= 7.7  -1/11 change to Levemir 14 units BID -1/12 increase NovoLog 15 units qac -Moderate SSI  HLD 1/6 LDL= 48   Severe protein calorie malnutrition -Encourage p.o. intake  -Prostat BID  Anxiety/Depressive Disorder  Chronic back pain/neuropathy/anxiety -Ativan 2 mg prior to CTA PE protocol  Lower extremity chronic wounds -1/6 wound care consult; recommendation.  Dressing procedure/placement/frequency: Wash BLE and feet with soapy water. Gently remove as much of the dry, flaking skin as possible.  Apply a THICK layer of Sween Moisturizer (pink and white tube in clean utility). Beginning behind the toes and going to just below the knees, wrap kerlex, then a 4 inch Ace Wrap. Perform daily. Monitor the wound area(s) for worsening of condition such as: Signs/symptoms of infection,  -1/12 reported slight discharge with odor and cracking of skin start neomycin-bacitracin-polymyxin antibiotic cream to legs daily with dressing change  Morbidly obese (BMI 51.92 kg/m)  Hypophosphatemia -Resolved  Hyperkalemia -Resolved    DVT prophylaxis: Lovenox Code Status: Full Family Communication: 1/12 spoke with Franchot Gallo (husband) counseled on plan of care answered all questions Disposition Plan: TBD   Consultants:    Procedures/Significant Events:  1/6 CTA chest PE protocol;-negative PE - Pulmonary parenchymal pattern of diffuse peribronchovascular and subpleural ground-glass is in keeping with moderate to severe COVID-19 pneumonia. 3. Enlarged pulmonic trunk, indicative of pulmonary arterial hypertension.   I have personally reviewed and interpreted all radiology studies and my findings are as above.  VENTILATOR SETTINGS: Room air 1/12 SPO2; 92%   Cultures   Antimicrobials:    06/22/19 1600  remdesivir 100 mg in sodium chloride 0.9 % 100 mL IVPB     100 mg 200 mL/hr over 30 Minutes 06/26/19 0959   06/21/19 2000  remdesivir 200 mg in sodium chloride 0.9% 250  mL IVPB     200 mg 580 mL/hr over 30 Minutes 06/21/19 2240       Devices    LINES / TUBES:      Continuous Infusions:    Objective: Vitals:   06/28/19 0441 06/28/19 0805 06/28/19 1128 06/28/19 1130  BP: (!) 142/92 138/82  (!) 149/84  Pulse:  87  93  Resp:  17  20  Temp: (!) 97.5 F (36.4 C) (!) 97.3 F (36.3 C)    TempSrc: Axillary Oral (P) Oral Oral  SpO2:  96%  95%  Weight:      Height:        Intake/Output Summary (Last 24 hours) at 06/28/2019 1132 Last data filed at 06/28/2019 0809 Gross per 24 hour  Intake --  Output 1800 ml  Net -1800 ml   Filed Weights   06/21/19 1710  Weight: (!) 141.5 kg   Physical Exam:  General: A/O x4 no acute respiratory distress Eyes: negative scleral hemorrhage, negative anisocoria, negative icterus ENT: Negative Runny nose, negative gingival bleeding, Neck:  Negative scars, masses, torticollis, lymphadenopathy, JVD Lungs: Clear to auscultation bilaterally without wheezes or crackles Cardiovascular: Regular rate and rhythm without murmur gallop or rub normal S1 and S2 Abdomen: negative abdominal pain, nondistended, positive soft, bowel sounds, no rebound, no ascites, no appreciable mass Extremities: No significant cyanosis, clubbing, or edema bilateral lower extremities, bilateral lower extremities freshly dressed mild pungent odor observed Skin: Negative rashes, lesions, ulcers Psychiatric:  Negative depression, negative anxiety, negative fatigue, negative mania  Central nervous system:  Cranial nerves II through XII intact, tongue/uvula midline, all extremities muscle strength 5/5, sensation intact throughout, negative dysarthria, negative expressive aphasia, negative receptive aphasia.   .     Data Reviewed: Care during the described time interval was provided by me .  I have reviewed this patient's available data, including medical history, events of note, physical examination, and all test results as part of my  evaluation.   CBC: Recent Labs  Lab 06/23/19 0145 06/24/19 0220 06/25/19 0517 06/26/19 0256 06/27/19 0510  WBC 6.8 9.2 7.0 6.7 7.3  NEUTROABS 5.5 7.6 5.2 4.9 5.4  HGB 12.7 12.6 13.4 13.1 13.2  HCT 41.0 40.5 43.4 42.6 42.9  MCV 88.2 87.9 87.9 88.4 89.7  PLT 344 456* 523* 545* 552*   Basic Metabolic Panel: Recent Labs  Lab 06/23/19 0145 06/24/19 0220 06/25/19 0517 06/26/19 0256 06/27/19 0510 06/28/19 0440  NA 136 139 137 136 137  --   K 4.1 4.1 4.5 4.8 5.2*  --   CL 105 103 101 102 101  --   CO2 18* 25 26 25 27   --   GLUCOSE 288* 230* 275* 361* 327*  --   BUN 19 22 23  28* 30*  --   CREATININE 0.94 0.83 0.95 0.96 1.08* 0.93  CALCIUM 9.1 8.9 8.8* 8.8* 9.1  --   MG 2.1 2.1 2.1 2.1 2.2 2.1  PHOS 2.4* 1.7* 3.4 3.3 3.6 3.7   GFR: Estimated Creatinine Clearance: 80.7 mL/min (by C-G formula based on SCr of 0.93 mg/dL). Liver Function Tests: Recent Labs  Lab 06/23/19 0145 06/24/19 0220 06/25/19 0517 06/26/19 0256 06/27/19 0510  AST 40 26 52* 30 24  ALT 26 23 46* 39 32  ALKPHOS 77  78 83 87 80  BILITOT 0.3 0.4 0.3 0.2* 0.3  PROT 8.8* 8.3* 7.9 7.5 7.5  ALBUMIN 2.8* 2.7* 2.7* 2.6* 2.8*   No results for input(s): LIPASE, AMYLASE in the last 168 hours. No results for input(s): AMMONIA in the last 168 hours. Coagulation Profile: No results for input(s): INR, PROTIME in the last 168 hours. Cardiac Enzymes: No results for input(s): CKTOTAL, CKMB, CKMBINDEX, TROPONINI in the last 168 hours. BNP (last 3 results) No results for input(s): PROBNP in the last 8760 hours. HbA1C: No results for input(s): HGBA1C in the last 72 hours. CBG: Recent Labs  Lab 06/27/19 2048 06/28/19 0031 06/28/19 0441 06/28/19 0759 06/28/19 1103  GLUCAP 257* 295* 327* 289* 238*   Lipid Profile: No results for input(s): CHOL, HDL, LDLCALC, TRIG, CHOLHDL, LDLDIRECT in the last 72 hours. Thyroid Function Tests: No results for input(s): TSH, T4TOTAL, FREET4, T3FREE, THYROIDAB in the last 72  hours. Anemia Panel: Recent Labs    06/27/19 0510 06/28/19 0440  FERRITIN 240 240   Urine analysis:    Component Value Date/Time   COLORURINE YELLOW 03/16/2017 2237   APPEARANCEUR CLEAR 03/16/2017 2237   LABSPEC 1.017 03/16/2017 2237   PHURINE 5.0 03/16/2017 2237   GLUCOSEU NEGATIVE 03/16/2017 2237   HGBUR NEGATIVE 03/16/2017 2237   BILIRUBINUR NEGATIVE 03/16/2017 2237   KETONESUR NEGATIVE 03/16/2017 2237   PROTEINUR 30 (A) 03/16/2017 2237   NITRITE NEGATIVE 03/16/2017 2237   LEUKOCYTESUR NEGATIVE 03/16/2017 2237   Sepsis Labs: @LABRCNTIP (procalcitonin:4,lacticidven:4)  ) Recent Results (from the past 240 hour(s))  Blood Culture (routine x 2)     Status: None   Collection Time: 06/21/19  5:13 PM   Specimen: BLOOD  Result Value Ref Range Status   Specimen Description BLOOD LEFT ANTECUBITAL  Final   Special Requests   Final    BOTTLES DRAWN AEROBIC AND ANAEROBIC Blood Culture results may not be optimal due to an inadequate volume of blood received in culture bottles   Culture   Final    NO GROWTH 5 DAYS Performed at [facility], [address], [place], [place]    Report Status 06/26/2019 FINAL  Final  Blood Culture (routine x 2)     Status: None   Collection Time: 06/21/19  5:18 PM   Specimen: BLOOD  Result Value Ref Range Status   Specimen Description BLOOD SITE NOT SPECIFIED  Final   Special Requests   Final    BOTTLES DRAWN AEROBIC AND ANAEROBIC Blood Culture results may not be optimal due to an inadequate volume of blood received in culture bottles   Culture   Final    NO GROWTH 5 DAYS Performed at [facility], [address] [place], [place]    Report Status 06/26/2019 FINAL  Final         Radiology Studies: No results found.      Scheduled Meds: . albuterol  2 puff Inhalation Q6H  . allopurinol  300 mg Oral Daily  . aspirin  325 mg Oral Daily  . enoxaparin (LOVENOX) injection  70 mg Subcutaneous Q24H  .  feeding supplement (PRO-STAT SUGAR FREE 64)  30 mL Oral BID  . insulin aspart  0-15 Units Subcutaneous Q4H  . insulin aspart  8 Units Subcutaneous TID WC  . insulin detemir  14 Units Subcutaneous BID  . polyethylene glycol  17 g Oral Daily   Continuous Infusions:    LOS: 7 days   The patient is  critically ill with multiple organ systems failure and requires high complexity decision making for assessment and support, frequent evaluation and titration of therapies, application of advanced monitoring technologies and extensive interpretation of multiple databases. Critical Care Time devoted to patient care services described in this note  Time spent: 40 minutes     Oyuki Hogan, Geraldo Docker, MD [facility] Pager [phone]  If 7PM-7AM, please contact night-coverage [email or web address] Password [facility] 06/28/2019, 11:32 AM

## 2019-06-29 ENCOUNTER — Encounter (HOSPITAL_BASED_OUTPATIENT_CLINIC_OR_DEPARTMENT_OTHER): Payer: Medicare PPO | Admitting: Physician Assistant

## 2019-06-29 DIAGNOSIS — J1282 Pneumonia due to coronavirus disease 2019: Secondary | ICD-10-CM

## 2019-06-29 DIAGNOSIS — U071 COVID-19: Secondary | ICD-10-CM

## 2019-06-29 DIAGNOSIS — F4329 Adjustment disorder with other symptoms: Secondary | ICD-10-CM

## 2019-06-29 LAB — CBC WITH DIFFERENTIAL/PLATELET
Abs Immature Granulocytes: 0.19 10*3/uL — ABNORMAL HIGH (ref 0.00–0.07)
Basophils Absolute: 0 10*3/uL (ref 0.0–0.1)
Basophils Relative: 0 %
Eosinophils Absolute: 0.2 10*3/uL (ref 0.0–0.5)
Eosinophils Relative: 2 %
HCT: 40.8 % (ref 36.0–46.0)
Hemoglobin: 12.5 g/dL (ref 12.0–15.0)
Immature Granulocytes: 2 %
Lymphocytes Relative: 34 %
Lymphs Abs: 2.7 10*3/uL (ref 0.7–4.0)
MCH: 27.3 pg (ref 26.0–34.0)
MCHC: 30.6 g/dL (ref 30.0–36.0)
MCV: 89.1 fL (ref 80.0–100.0)
Monocytes Absolute: 0.7 10*3/uL (ref 0.1–1.0)
Monocytes Relative: 9 %
Neutro Abs: 4.1 10*3/uL (ref 1.7–7.7)
Neutrophils Relative %: 53 %
Platelets: 529 10*3/uL — ABNORMAL HIGH (ref 150–400)
RBC: 4.58 MIL/uL (ref 3.87–5.11)
RDW: 17.8 % — ABNORMAL HIGH (ref 11.5–15.5)
WBC: 7.9 10*3/uL (ref 4.0–10.5)
nRBC: 0.5 % — ABNORMAL HIGH (ref 0.0–0.2)

## 2019-06-29 LAB — C-REACTIVE PROTEIN: CRP: 0.8 mg/dL (ref <1.0)

## 2019-06-29 LAB — GLUCOSE, CAPILLARY
Glucose-Capillary: 123 mg/dL — ABNORMAL HIGH (ref 70–99)
Glucose-Capillary: 152 mg/dL — ABNORMAL HIGH (ref 70–99)
Glucose-Capillary: 153 mg/dL — ABNORMAL HIGH (ref 70–99)
Glucose-Capillary: 217 mg/dL — ABNORMAL HIGH (ref 70–99)

## 2019-06-29 LAB — PHOSPHORUS: Phosphorus: 4 mg/dL (ref 2.5–4.6)

## 2019-06-29 LAB — COMPREHENSIVE METABOLIC PANEL
ALT: 22 U/L (ref 0–44)
AST: 14 U/L — ABNORMAL LOW (ref 15–41)
Albumin: 2.6 g/dL — ABNORMAL LOW (ref 3.5–5.0)
Alkaline Phosphatase: 71 U/L (ref 38–126)
Anion gap: 9 (ref 5–15)
BUN: 34 mg/dL — ABNORMAL HIGH (ref 8–23)
CO2: 26 mmol/L (ref 22–32)
Calcium: 9 mg/dL (ref 8.9–10.3)
Chloride: 104 mmol/L (ref 98–111)
Creatinine, Ser: 0.95 mg/dL (ref 0.44–1.00)
GFR calc Af Amer: >60 mL/min (ref >60)
GFR calc non Af Amer: >60 mL/min (ref >60)
Glucose, Bld: 150 mg/dL — ABNORMAL HIGH (ref 70–99)
Potassium: 3.7 mmol/L (ref 3.5–5.1)
Sodium: 139 mmol/L (ref 135–145)
Total Bilirubin: 0.3 mg/dL (ref 0.3–1.2)
Total Protein: 6.6 g/dL (ref 6.5–8.1)

## 2019-06-29 LAB — D-DIMER, QUANTITATIVE: D-Dimer, Quant: 1 ug/mL-FEU — ABNORMAL HIGH (ref 0.00–0.50)

## 2019-06-29 LAB — MAGNESIUM: Magnesium: 2.2 mg/dL (ref 1.7–2.4)

## 2019-06-29 LAB — FERRITIN: Ferritin: 208 ng/mL (ref 11–307)

## 2019-06-29 NOTE — Discharge Summary (Signed)
Physician Discharge Summary  JAZMAINE FUELLING GDJ:[id number] DOB: 16-Aug-1950 DOA: 06/21/2019  PCP: Sonia Side., FNP  Admit date: 06/21/2019 Discharge date: 06/29/2019  Admitted From: home  Disposition:  Home   Recommendations for Outpatient Follow-up and new medication changes:  1. Follow up with Dr. Tamala Julian in 2 weeks.  2. Please continue self quarantine for 2 weeks, use a mask in public and maintain physical distancing.   Home Health: Yes  Equipment/Devices: Yes    Discharge Condition: stable  CODE STATUS: full  Diet recommendation: heart healthy and diabetic prudent.   Brief/Interim Summary: 69 year old female who presented with dyspnea.  She does have significant past medical history for depression, chronic lower back pain, hypertension, dyslipidemia, type 2 diabetes mellitus and asthma.  Patient reported dry cough, fevers, and dyspnea.  When EMS arrived her oximetry was 80% on room air. On her initial physical examination her blood pressure was 108/60, pulse rate 77, temperature 99.1, respiratory rate 39, oxygen saturation 92% on supplemental oxygen.  Her lungs were clear to auscultation bilaterally.  Heart S1-S2 present, rhythmic, soft abdomen and no lower extremity edema.  Sodium 135, potassium 3.8, chloride 102, bicarb 21, glucose 106, BUN 12, creatinine 1.11, AST 40, ALT 21, white count 7.8, hemoglobin 13.2, hematocrit 41.9, platelets 261. SARS COVID-19 positive. Her chest radiograph had bilateral faint interstitial infiltrates.  EKG 79 beatsper minute, normal axis, normal intervals, sinus rhythm, no ST segment T wave changes.  Patient was admitted to the hospital with working diagnosis of acute hypoxic respiratory failure due to SARS COVID-19 viral pneumonia  1.  Acute hypoxic respiratory failure due to SARS COVID-19 viral pneumonia.  Patient was admitted to the medical ward, she received supplemental oxygen per nasal cannula, she was treated with dexamethasone, remdesivir and 1  dose of Actemra.  Further work-up with CT chest was negative for pulmonary embolism.  Patient responded well to medical therapy, her symptoms improved, along with her inflammatory markers, her oximetry at discharge is 93% on room air.  COVID-19 Labs  Recent Labs    06/27/19 0510 06/28/19 0440 06/29/19 0510  DDIMER 1.93* 1.24* 1.00*  FERRITIN 240 240 208  CRP 1.2* 0.9 0.8    Lab Results  Component Value Date   SARSCOV2NAA Detected (A) 06/14/2019     2.  Uncontrolled type 2 diabetes mellitus, hemoglobin Q2I 7.7, complicated by steroid-induced hyperglycemia. Dyslipidemia.  Patient required insulin therapy for glucose control and monitoring.  At discharge corticosteroids will be discontinued and patient resume her regular insulin regimen. Continue with statin therapy.   3.  Lower extremity chronic wounds bilaterally.  Patient was evaluated with wound care team, continue wound care while hospitalized, patient will follow up with outpatient wound clinic.  She received local neomycin while hospitalized.  4.  Morbid obesity, BMI 29.7, complicated by calorie protein malnutrition/transitory hypophosphatemia/hypokalemia.  Patient received nutritional supplements while hospitalized, follow-up as an outpatient.  5.  Chronic back pain, neuropathy, anxiety and depression.  Continue home regimen.  6. HTN. Continue with atenolol for blood pressure control. 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   Increase activity slowly   Complete by: As directed      Allergies as of 06/29/2019      Reactions   Ace Inhibitors Swelling   SWELLING REACTION UNSPECIFIED    Tizanidine    Metformin And Related Other (See Comments)   CHILLS   Propofol Itching      Medication List    STOP taking these medications   doxycycline 100 MG capsule Commonly known as: VIBRAMYCIN     TAKE these medications   Accu-Chek FastClix Lancets Misc 1 each as needed.   Accu-Chek Nano SmartView w/Device Kit 1 each See admin instructions.   Accu-Chek SmartView test strip Generic drug: glucose blood 1 each 4 (four) times daily. for testing   acetaminophen 500 MG tablet Commonly known as: TYLENOL Take 500 mg by mouth daily as needed for moderate pain.   albuterol 108 (90 Base) MCG/ACT inhaler Commonly known as: VENTOLIN HFA Inhale 2 puffs into the lungs every 4 (four) hours as needed for wheezing or shortness of breath.   allopurinol 300 MG tablet Commonly known as: ZYLOPRIM Take 300 mg by mouth daily.   aspirin 325 MG tablet Take 325 mg by mouth daily.   atenolol 25 MG tablet Commonly known as: TENORMIN Take 25-50 mg by mouth 2 (two) times daily. Take 50 mg every morning and then 25 mg every evening   cyclobenzaprine 10 MG tablet Commonly known as: FLEXERIL Take 10 mg by mouth at bedtime as needed for muscle spasms.   diclofenac sodium 1 % Gel Commonly known as: Voltaren Apply 4 g topically 4 (four) times daily. What changed:   when to take this  reasons to take this   Diclofenac Sodium CR 100 MG 24 hr tablet Commonly known as: Voltaren-XR Take 1  tablet (100 mg total) by mouth daily.   furosemide 20 MG tablet Commonly known as: Lasix Take 1 tablet (20 mg total) by mouth daily.   gabapentin 300 MG capsule Commonly known as: NEURONTIN Take 1 capsule (300 mg total) by mouth 2 (two) times daily. What changed: how much to take   hydrocerin Crea Apply 1 application topically daily.   insulin degludec 100 UNIT/ML Sopn FlexTouch Pen Commonly known as: Tyler Aas FlexTouch Inject 0.75 mLs (75 Units total) into the skin daily. What changed: when to take this   magnesium gluconate 500 MG tablet Commonly known as: MAGONATE Take 500 mg by mouth daily.   NIFEdipine 90 MG 24 hr tablet Commonly known as: PROCARDIA XL/NIFEDICAL-XL Take 90 mg by mouth daily.   NovoFine 32G X 6 MM Misc Generic drug: Insulin Pen Needle 1 each as needed.   NovoLOG FlexPen 100 UNIT/ML FlexPen Generic drug: insulin aspart Take 17 Units by mouth 2 (two) times daily.   polyethylene glycol 17 g packet Commonly known as: MIRALAX / GLYCOLAX Take 17 g by mouth daily as needed for moderate constipation.   potassium chloride 10 MEQ tablet Commonly known as: KLOR-CON Take 1 tablet (10 mEq total) by mouth daily.  protein supplement shake Liqd Commonly known as: PREMIER PROTEIN Take 325 mLs (11 oz total) by mouth daily.   Vitamin D3 50 MCG (2000 UT) capsule Take 2,000 Units by mouth daily.      Follow-up Information    Sonia Side., FNP Follow up in 2 week(s).   Specialty: Family Medicine Contact information: [place] [address] [phone]          Allergies  Allergen Reactions  . Ace Inhibitors Swelling    SWELLING REACTION UNSPECIFIED   . Tizanidine   . Metformin And Related Other (See Comments)    CHILLS  . Propofol Itching    Consultations:     Procedures/Studies: CT ANGIO CHEST PE W OR WO CONTRAST  Result Date: 06/22/2019 CLINICAL DATA:  Shortness of breath, elevated D-dimer, COVID-19 positive. EXAM:  CT ANGIOGRAPHY CHEST WITH CONTRAST TECHNIQUE: Multidetector CT imaging of the chest was performed using the standard protocol during bolus administration of intravenous contrast. Multiplanar CT image reconstructions and MIPs were obtained to evaluate the vascular anatomy. CONTRAST:  11m OMNIPAQUE IOHEXOL 350 MG/ML SOLN COMPARISON:  Chest radiograph 06/21/2019. FINDINGS: Cardiovascular: Image quality is markedly degraded by respiratory motion and body habitus. Lobar, segmental and subsegmental pulmonary arteries therefore cannot be evaluated. No large central pulmonary embolus. Pulmonic trunk and heart are enlarged. No pericardial effusion. Mediastinum/Nodes: No pathologically enlarged mediastinal, hilar or axillary lymph nodes. Esophagus is grossly unremarkable. Lungs/Pleura: Marked image degradation by respiratory motion, expiratory phase imaging and body habitus. Extensive peribronchovascular and subpleural ground-glass opacification. No pleural fluid. Upper Abdomen: Image quality in the upper abdomen is markedly degraded by body habitus and respiratory motion. Visualized structures are grossly unremarkable. Musculoskeletal: Degenerative changes in the spine. Review of the MIP images confirms the above findings. IMPRESSION: 1. Image quality is markedly degraded by respiratory motion and body habitus, limiting the evaluation of the lobar, segmental and subsegmental pulmonary arteries. No large central pulmonary embolus. 2. Pulmonary parenchymal pattern of diffuse peribronchovascular and subpleural ground-glass is in keeping with moderate to severe COVID-19 pneumonia. 3. Enlarged pulmonic trunk, indicative of pulmonary arterial hypertension. Electronically Signed   By: MLorin PicketM.D.   On: 06/22/2019 13:26   DG Chest Port 1 View  Result Date: 06/21/2019 CLINICAL DATA:  Shortness of breath. EXAM: PORTABLE CHEST 1 VIEW COMPARISON:  September 09, 2017 FINDINGS: Mild diffusely increased interstitial lung markings  are noted. This is unchanged in appearance when compared to the prior study. No focal consolidation is identified. The heart size and mediastinal contours are within normal limits. There is tortuosity of the descending thoracic aorta. Degenerative changes seen throughout the thoracic spine. IMPRESSION: 1. Mild diffusely increased interstitial lung markings which are likely, in part, chronic in nature. Electronically Signed   By: TVirgina NorfolkM.D.   On: 06/21/2019 18:22      Procedures:   Subjective: Patient is feeling better, back to her baseline, her dyspnea has improved, no chest pain, no nausea or vomiting. She has declined PT evaluation.   Discharge Exam: Vitals:   06/29/19 0500 06/29/19 0720  BP: 122/64 123/71  Pulse: 77 72  Resp: 19 20  Temp: (!) 96.9 F (36.1 C) 98.3 F (36.8 C)  SpO2: 92% 93%   Vitals:   06/28/19 1529 06/28/19 2100 06/29/19 0500 06/29/19 0720  BP: (!) 117/57  122/64 123/71  Pulse: 79  77 72  Resp: _0 Temp:  97.9 F (36.6 C) (!) 96.9 F (36.1 C) 98.3  F (36.8 C)  TempSrc: Oral Oral Axillary Oral  SpO2: 94%  92% 93%  Weight:      Height:        General: Not in pain or dyspnea.  Neurology: Awake and alert, non focal  E ENT: no pallor, no icterus, oral mucosa moist Cardiovascular: No JVD. S1-S2 present, rhythmic. No lower extremity edema. Pulmonary: positive breath sounds bilaterally. Gastrointestinal. Abdomen with no organomegaly, non tender, no rebound or guarding Skin. No rashes Musculoskeletal: no joint deformities   The results of significant diagnostics from this hospitalization (including imaging, microbiology, ancillary and laboratory) are listed below for reference.     Microbiology: Recent Results (from the past 240 hour(s))  Blood Culture (routine x 2)     Status: None   Collection Time: 06/21/19  5:13 PM   Specimen: BLOOD  Result Value Ref Range Status   Specimen Description BLOOD LEFT ANTECUBITAL  Final   Special  Requests   Final    BOTTLES DRAWN AEROBIC AND ANAEROBIC Blood Culture results may not be optimal due to an inadequate volume of blood received in culture bottles   Culture   Final    NO GROWTH 5 DAYS Performed at [facility], [place] [address], [place], [place] [address]    Report Status 06/26/2019 FINAL  Final  Blood Culture (routine x 2)     Status: None   Collection Time: 06/21/19  5:18 PM   Specimen: BLOOD  Result Value Ref Range Status   Specimen Description BLOOD SITE NOT SPECIFIED  Final   Special Requests   Final    BOTTLES DRAWN AEROBIC AND ANAEROBIC Blood Culture results may not be optimal due to an inadequate volume of blood received in culture bottles   Culture   Final    NO GROWTH 5 DAYS Performed at [facility], [place] [address] [place], [place] [address]    Report Status 06/26/2019 FINAL  Final     Labs: BNP (last 3 results) No results for input(s): BNP in the last 8760 hours. Basic Metabolic Panel: Recent Labs  Lab 06/25/19 0517 06/26/19 0256 06/27/19 0510 06/28/19 0440 06/28/19 1140 06/29/19 0510  NA 137 136 137  --  139 139  K 4.5 4.8 5.2*  --  4.2 3.7  CL 101 102 101  --  102 104  CO2 _0 --  26 26  GLUCOSE 275* 361* 327*  --  236* 150*  BUN 23 28* 30*  --  30* 34*  CREATININE 0.95 0.96 1.08* 0.93 0.90 0.95  CALCIUM 8.8* 8.8* 9.1  --  9.2 9.0  MG 2.1 2.1 2.2 2.1  --  2.2  PHOS 3.4 3.3 3.6 3.7  --  4.0   Liver Function Tests: Recent Labs  Lab 06/25/19 0517 06/26/19 0256 06/27/19 0510 06/28/19 1140 06/29/19 0510  AST 52* _1 14*  ALT 46* 39 32 29 22  ALKPHOS 83 87 80 84 71  BILITOT 0.3 0.2* 0.3 0.4 0.3  PROT 7.9 7.5 7.5 7.5 6.6  ALBUMIN 2.7* 2.6* 2.8* 3.0* 2.6*   No results for input(s): LIPASE, AMYLASE in the last 168 hours. No results for input(s): AMMONIA in the last 168 hours. CBC: Recent Labs  Lab 06/25/19 0517 06/26/19 0256 06/27/19 0510 06/28/19 1140 06/29/19 0510  WBC 7.0 6.7 7.3 10.6* 7.9  NEUTROABS  5.2 4.9 5.4 7.9* 4.1  HGB 13.4 13.1 13.2 13.4 12.5  HCT 43.4 42.6 42.9 43.2 40.8  MCV 87.9 88.4 89.7 89.4 89.1  PLT 523* 545* 552* 595* 529*   Cardiac Enzymes: No results for input(s): CKTOTAL, CKMB, CKMBINDEX, TROPONINI in the last 168 hours. BNP: Invalid input(s): POCBNP CBG: Recent Labs  Lab 06/28/19 1532 06/28/19 2103 06/29/19 0039 06/29/19 0456 06/29/19 0717  GLUCAP 202* 205* 217* 153* 152*   D-Dimer Recent Labs    06/28/19 0440 06/29/19 0510  DDIMER 1.24* 1.00*   Hgb A1c No results for input(s): HGBA1C in the last 72 hours. Lipid Profile No results for input(s): CHOL, HDL, LDLCALC, TRIG, CHOLHDL, LDLDIRECT in the last 72 hours. Thyroid function studies No results for input(s): TSH, T4TOTAL, T3FREE, THYROIDAB in the last 72 hours.  Invalid input(s): FREET3 Anemia work up Recent Labs    06/28/19 0440 06/29/19 0510  FERRITIN 240 208   Urinalysis    Component Value Date/Time   COLORURINE YELLOW 03/16/2017 2237   APPEARANCEUR CLEAR 03/16/2017 2237   LABSPEC 1.017 03/16/2017 2237   PHURINE 5.0 03/16/2017 2237   GLUCOSEU NEGATIVE 03/16/2017 2237   HGBUR NEGATIVE 03/16/2017 2237   [place] NEGATIVE 03/16/2017 2237   KETONESUR NEGATIVE 03/16/2017 2237   PROTEINUR 30 (A) 03/16/2017 2237   NITRITE NEGATIVE 03/16/2017 2237   LEUKOCYTESUR NEGATIVE 03/16/2017 2237   Sepsis Labs Invalid input(s): PROCALCITONIN,  WBC,  LACTICIDVEN Microbiology Recent Results (from the past 240 hour(s))  Blood Culture (routine x 2)     Status: None   Collection Time: 06/21/19  5:13 PM   Specimen: BLOOD  Result Value Ref Range Status   Specimen Description BLOOD LEFT ANTECUBITAL  Final   Special Requests   Final    BOTTLES DRAWN AEROBIC AND ANAEROBIC Blood Culture results may not be optimal due to an inadequate volume of blood received in culture bottles   Culture   Final    NO GROWTH 5 DAYS Performed at [facility], [place] [address] [place], [place] [address]     Report Status 06/26/2019 FINAL  Final  Blood Culture (routine x 2)     Status: None   Collection Time: 06/21/19  5:18 PM   Specimen: BLOOD  Result Value Ref Range Status   Specimen Description BLOOD SITE NOT SPECIFIED  Final   Special Requests   Final    BOTTLES DRAWN AEROBIC AND ANAEROBIC Blood Culture results may not be optimal due to an inadequate volume of blood received in culture bottles   Culture   Final    NO GROWTH 5 DAYS Performed at [facility], [place] [address] [place], [place] [address]    Report Status 06/26/2019 FINAL  Final     Time coordinating discharge: 45 minutes  SIGNED:   Tawni Millers, MD  [facility] 06/29/2019, 11:00 AM

## 2019-06-29 NOTE — Care Management Important Message (Signed)
Important Message  Patient Details  Name: ARCENIA SCARBRO MRN: [id number] Date of Birth: 03-17-1951   Medicare Important Message Given:  Yes - Important Message mailed due to current National Emergency  Verbal consent obtained due to current National Emergency    Contact Name: Hser Belanger Call Date: 06/29/19  Time: 1147 Phone: [phone] Outcome: No Answer/Busy Important Message mailed to: Patient address on file    Orson Aloe 06/29/2019, 11:48 AM

## 2019-06-29 NOTE — TOC Transition Note (Signed)
Transition of Care [facility]) - CM/SW Discharge Note   Patient Details  Name: [name] MRN: [id number] Date of Birth: 08/06/1950  Transition of Care [facility]) CM/SW Contact:  Elliot Gault, LCSW Phone Number: 06/29/2019, 11:26 AM   Clinical Narrative:     Pt stable for dc. She has orders for [facility] RN, PT, OT. Spoke with pt by phone to discuss. Pt indicated that she is active with [facility] for nursing care and she would like to continue with them.  Per pt, she has a hospital bed, trapeze, motorized scooter, etc and she does not feel that she needs any additional DME at this time. Pt states that her husband will transport her home.  Updated Cassie at [facility].   There are no other TOC needs for dc.  Expected Discharge Plan: Home w Home Health Services Barriers to Discharge: Barriers Resolved   Patient Goals and CMS Choice     Choice offered to / list presented to : Patient  Expected Discharge Plan and Services Expected Discharge Plan: Home w Home Health Services In-house Referral: Clinical Social Work   Post Acute Care Choice: Resumption of Svcs/PTA Provider Living arrangements for the past 2 months: Single Family Home Expected Discharge Date: 06/29/19                         HH Arranged: PT, OT HH Agency: [facility] Date HH Agency Contacted: 06/29/19   Representative spoke with at [facility] Agency: Cassie  Prior Living Arrangements/Services Living arrangements for the past 2 months: Single Family Home Lives with:: Spouse Patient language and need for interpreter reviewed:: Yes Do you feel safe going back to the place where you live?: Yes      Need for Family Participation in Patient Care: Yes (Comment) Care giver support system in place?: Yes (comment) Current home services: Home RN, DME Criminal Activity/Legal Involvement Pertinent to Current Situation/Hospitalization: No - Comment as needed  Activities of Daily Living Home Assistive  Devices/Equipment: Environmental consultant (specify type) ADL Screening (condition at time of admission) Patient's cognitive ability adequate to safely complete daily activities?: Yes Is the patient deaf or have difficulty hearing?: No Does the patient have difficulty seeing, even when wearing glasses/contacts?: No Does the patient have difficulty concentrating, remembering, or making decisions?: No Patient able to express need for assistance with ADLs?: Yes Does the patient have difficulty dressing or bathing?: Yes Independently performs ADLs?: No Does the patient have difficulty walking or climbing stairs?: Yes Weakness of Legs: Both Weakness of Arms/Hands: None  Permission Sought/Granted Permission sought to share information with : Oceanographer granted to share information with : Yes, Verbal Permission Granted     Permission granted to share info w AGENCY: [facility]        Emotional Assessment   Attitude/Demeanor/Rapport: Engaged Affect (typically observed): Pleasant Orientation: : Oriented to Self, Oriented to Place, Oriented to  Time, Oriented to Situation Alcohol / Substance Use: Not Applicable Psych Involvement: No (comment)  Admission diagnosis:  Hypoxia [R09.02] Acute respiratory failure with hypoxia (HCC) [J96.01] COVID-19 [U07.1] Patient Active Problem List   Diagnosis Date Noted  . Hypophosphatemia 06/24/2019  . Morbidly obese (HCC) 06/23/2019  . Diabetes mellitus type 2, uncontrolled, with complications (HCC) 06/22/2019  . Anxiety 06/22/2019  . Depressive disorder 06/22/2019  . Essential hypertension 06/22/2019  . HLD (hyperlipidemia) 06/22/2019  . COVID-19 06/21/2019  . Severe protein-calorie malnutrition (HCC) 06/21/2019  . Acute respiratory failure with hypoxia (HCC)  06/21/2019  . Diabetic foot ulcer associated with type 2 diabetes mellitus ([place])   . Cellulitis 07/05/2016  . Depression with anxiety 05/30/2016  . Chronic skin ulcer of lower  leg ([place]) 05/30/2016  . Generalized weakness 05/30/2016  . Left arm weakness 05/30/2016  . Left Lower extremity pain  04/02/2016  . Lower extremity pain, inferior, left 04/02/2016  . Left leg pain   . Personal history of noncompliance with medical treatment, presenting hazards to health 03/31/2016  . Jaw pain 03/27/2016  . Left leg cellulitis 03/24/2016  . Cellulitis of left lower extremity   . Primary insomnia   . Adjustment disorder   . Morbid obesity ([place]) 01/27/2016  . Wheelchair dependent 01/27/2016  . Gout 01/27/2016  . Asthma 01/27/2016  . Arthritis 01/27/2016  . Uncontrolled diabetes mellitus type 2 with peripheral artery disease ([place]) 01/25/2016  . Cellulitis of left leg 01/11/2016  . Insulin-requiring or dependent type II diabetes mellitus ([place]) 01/11/2016  . Hypertension 01/11/2016   PCP:  Sonia Side., FNP Pharmacy:   [facility] [address] - [place], [place] [address] [place] [address] Phone: [phone] Fax: [phone]  [facility] [phone] - [place], [place] [address] [place] [address] Phone: [phone] Fax: [phone]     Social Determinants of Health (SDOH) Interventions    Readmission Risk Interventions Readmission Risk Prevention Plan 06/29/2019  Medication Screening Complete  Transportation Screening Complete  Some recent data might be hidden     Final next level of care: Home w Home Health Services Barriers to Discharge: Barriers Resolved   Patient Goals and CMS Choice     Choice offered to / list presented to : Patient  Discharge Placement                       Discharge Plan and Services In-house Referral: Clinical Social Work   Post Acute Care Choice: Resumption of Svcs/PTA Provider                    HH Arranged: PT, OT [place] Agency: [facility] Date [place]:  06/29/19   Representative spoke with at [place]: Cassie  Social Determinants of Health ([place]) Interventions     Readmission Risk Interventions Readmission Risk Prevention Plan 06/29/2019  Medication Screening Complete  Transportation Screening Complete  Some recent data might be hidden

## 2019-06-29 NOTE — Progress Notes (Signed)
AVS explained, no further questions, PIV removed, spoke with husband whom will be transporting her home. Pt refused PT/OT prior to discharge and stated she will go home with husband. She does not need additional help or any other transport services.

## 2019-06-29 NOTE — Discharge Instructions (Signed)
10 Things You Can Do to Manage Your COVID-19 Symptoms at Home If you have possible or confirmed COVID-19: 1. Stay home from work and school. And stay away from other public places. If you must go out, avoid using any kind of public transportation, ridesharing, or taxis. 2. Monitor your symptoms carefully. If your symptoms get worse, call your healthcare provider immediately. 3. Get rest and stay hydrated. 4. If you have a medical appointment, call the healthcare provider ahead of time and tell them that you have or may have COVID-19. 5. For medical emergencies, call 911 and notify the dispatch personnel that you have or may have COVID-19. 6. Cover your cough and sneezes with a tissue or use the inside of your elbow. 7. Wash your hands often with soap and water for at least 20 seconds or clean your hands with an alcohol-based hand sanitizer that contains at least 60% alcohol. 8. As much as possible, stay in a specific room and away from other people in your home. Also, you should use a separate bathroom, if available. If you need to be around other people in or outside of the home, wear a mask. 9. Avoid sharing personal items with other people in your household, like dishes, towels, and bedding. 10. Clean all surfaces that are touched often, like counters, tabletops, and doorknobs. Use household cleaning sprays or wipes according to the label instructions. [email or web address] 12/15/2018 This information is not intended to replace advice given to you by your health care provider. Make sure you discuss any questions you have with your health care provider. Document Revised: 05/19/2019 Document Reviewed: 05/19/2019 Elsevier Patient Education  2020 [employer]    Person Under Monitoring Name: [name]  Location: [address] [address] [place] [address]   Infection Prevention Recommendations for Individuals Confirmed to have, or Being Evaluated for, 2019 Novel Coronavirus (COVID-19) Infection  Who Receive Care at Home  Individuals who are confirmed to have, or are being evaluated for, COVID-19 should follow the prevention steps below until a healthcare provider or local or state health department says they can return to normal activities.  Stay home except to get medical care You should restrict activities outside your home, except for getting medical care. Do not go to work, school, or public areas, and do not use public transportation or taxis.  Call ahead before visiting your doctor Before your medical appointment, call the healthcare provider and tell them that you have, or are being evaluated for, COVID-19 infection. This will help the healthcare provider's office take steps to keep other people from getting infected. Ask your healthcare provider to call the local or state health department.  Monitor your symptoms Seek prompt medical attention if your illness is worsening (e.g., difficulty breathing). Before going to your medical appointment, call the healthcare provider and tell them that you have, or are being evaluated for, COVID-19 infection. Ask your healthcare provider to call the local or state health department.  Wear a facemask You should wear a facemask that covers your nose and mouth when you are in the same room with other people and when you visit a healthcare provider. People who live with or visit you should also wear a facemask while they are in the same room with you.  Separate yourself from other people in your home As much as possible, you should stay in a different room from other people in your home. Also, you should use a separate bathroom, if available.  Avoid sharing household items  You should not share dishes, drinking glasses, cups, eating utensils, towels, bedding, or other items with other people in your home. After using these items, you should wash them thoroughly with soap and water.  Cover your coughs and sneezes Cover your mouth and  nose with a tissue when you cough or sneeze, or you can cough or sneeze into your sleeve. Throw used tissues in a lined trash can, and immediately wash your hands with soap and water for at least 20 seconds or use an alcohol-based hand rub.  Wash your [facility] your hands often and thoroughly with soap and water for at least 20 seconds. You can use an alcohol-based hand sanitizer if soap and water are not available and if your hands are not visibly dirty. Avoid touching your eyes, nose, and mouth with unwashed hands.   Prevention Steps for Caregivers and Household Members of Individuals Confirmed to have, or Being Evaluated for, COVID-19 Infection Being Cared for in the Home  If you live with, or provide care at home for, a person confirmed to have, or being evaluated for, COVID-19 infection please follow these guidelines to prevent infection:  Follow healthcare provider's instructions Make sure that you understand and can help the patient follow any healthcare provider instructions for all care.  Provide for the patient's basic needs You should help the patient with basic needs in the home and provide support for getting groceries, prescriptions, and other personal needs.  Monitor the patient's symptoms If they are getting sicker, call his or her medical provider and tell them that the patient has, or is being evaluated for, COVID-19 infection. This will help the healthcare provider's office take steps to keep other people from getting infected. Ask the healthcare provider to call the local or state health department.  Limit the number of people who have contact with the patient  If possible, have only one caregiver for the patient.  Other household members should stay in another home or place of residence. If this is not possible, they should stay  in another room, or be separated from the patient as much as possible. Use a separate bathroom, if available.  Restrict visitors  who do not have an essential need to be in the home.  Keep older adults, very young children, and other sick people away from the patient Keep older adults, very young children, and those who have compromised immune systems or chronic health conditions away from the patient. This includes people with chronic heart, lung, or kidney conditions, diabetes, and cancer.  Ensure good ventilation Make sure that shared spaces in the home have good air flow, such as from an air conditioner or an opened window, weather permitting.  Wash your hands often  Wash your hands often and thoroughly with soap and water for at least 20 seconds. You can use an alcohol based hand sanitizer if soap and water are not available and if your hands are not visibly dirty.  Avoid touching your eyes, nose, and mouth with unwashed hands.  Use disposable paper towels to dry your hands. If not available, use dedicated cloth towels and replace them when they become wet.  Wear a facemask and gloves  Wear a disposable facemask at all times in the room and gloves when you touch or have contact with the patient's blood, body fluids, and/or secretions or excretions, such as sweat, saliva, sputum, nasal mucus, vomit, urine, or feces.  Ensure the mask fits over your nose and mouth tightly, and do  not touch it during use.  Throw out disposable facemasks and gloves after using them. Do not reuse.  Wash your hands immediately after removing your facemask and gloves.  If your personal clothing becomes contaminated, carefully remove clothing and launder. Wash your hands after handling contaminated clothing.  Place all used disposable facemasks, gloves, and other waste in a lined container before disposing them with other household waste.  Remove gloves and wash your hands immediately after handling these items.  Do not share dishes, glasses, or other household items with the patient  Avoid sharing household items. You should not  share dishes, drinking glasses, cups, eating utensils, towels, bedding, or other items with a patient who is confirmed to have, or being evaluated for, COVID-19 infection.  After the person uses these items, you should wash them thoroughly with soap and water.  Wash laundry thoroughly  Immediately remove and wash clothes or bedding that have blood, body fluids, and/or secretions or excretions, such as sweat, saliva, sputum, nasal mucus, vomit, urine, or feces, on them.  Wear gloves when handling laundry from the patient.  Read and follow directions on labels of laundry or clothing items and detergent. In general, wash and dry with the warmest temperatures recommended on the label.  Clean all areas the individual has used often  Clean all touchable surfaces, such as counters, tabletops, doorknobs, bathroom fixtures, toilets, phones, keyboards, tablets, and bedside tables, every day. Also, clean any surfaces that may have blood, body fluids, and/or secretions or excretions on them.  Wear gloves when cleaning surfaces the patient has come in contact with.  Use a diluted bleach solution (e.g., dilute bleach with 1 part bleach and 10 parts water) or a household disinfectant with a label that says EPA-registered for coronaviruses. To make a bleach solution at home, add 1 tablespoon of bleach to 1 quart (4 cups) of water. For a larger supply, add  cup of bleach to 1 gallon (16 cups) of water.  Read labels of cleaning products and follow recommendations provided on product labels. Labels contain instructions for safe and effective use of the cleaning product including precautions you should take when applying the product, such as wearing gloves or eye protection and making sure you have good ventilation during use of the product.  Remove gloves and wash hands immediately after cleaning.  Monitor yourself for signs and symptoms of illness Caregivers and household members are considered close  contacts, should monitor their health, and will be asked to limit movement outside of the home to the extent possible. Follow the monitoring steps for close contacts listed on the symptom monitoring form.   ? If you have additional questions, contact your local health department or call the epidemiologist on call at [phone] (available 24/7). ? This guidance is subject to change. For the most up-to-date guidance from [facility], please refer to their website: [email or web address]

## 2019-06-29 NOTE — Plan of Care (Signed)
  Problem: Education: Goal: Knowledge of General Education information will improve Description: Including pain rating scale, medication(s)/side effects and non-pharmacologic comfort measures 06/29/2019 1322 by Janan Halter, RN Outcome: Adequate for Discharge 06/29/2019 1241 by Janan Halter, RN Outcome: Progressing   Problem: Health Behavior/Discharge Planning: Goal: Ability to manage health-related needs will improve 06/29/2019 1322 by Janan Halter, RN Outcome: Adequate for Discharge 06/29/2019 1241 by Janan Halter, RN Outcome: Progressing   Problem: Clinical Measurements: Goal: Ability to maintain clinical measurements within normal limits will improve 06/29/2019 1322 by Janan Halter, RN Outcome: Adequate for Discharge 06/29/2019 1241 by Janan Halter, RN Outcome: Progressing Goal: Will remain free from infection 06/29/2019 1322 by Janan Halter, RN Outcome: Adequate for Discharge 06/29/2019 1241 by Janan Halter, RN Outcome: Progressing Goal: Diagnostic test results will improve 06/29/2019 1322 by Janan Halter, RN Outcome: Adequate for Discharge 06/29/2019 1241 by Janan Halter, RN Outcome: Progressing Goal: Respiratory complications will improve 06/29/2019 1322 by Janan Halter, RN Outcome: Adequate for Discharge 06/29/2019 1241 by Janan Halter, RN Outcome: Progressing Goal: Cardiovascular complication will be avoided 06/29/2019 1322 by Janan Halter, RN Outcome: Adequate for Discharge 06/29/2019 1241 by Janan Halter, RN Outcome: Progressing   Problem: Activity: Goal: Risk for activity intolerance will decrease 06/29/2019 1322 by Janan Halter, RN Outcome: Adequate for Discharge 06/29/2019 1241 by Janan Halter, RN Outcome: Progressing   Problem: Nutrition: Goal: Adequate nutrition will be maintained 06/29/2019 1322 by Janan Halter, RN Outcome: Adequate for Discharge 06/29/2019 1241 by Janan Halter, RN Outcome: Progressing   Problem: Coping: Goal: Level of anxiety  will decrease 06/29/2019 1322 by Janan Halter, RN Outcome: Adequate for Discharge 06/29/2019 1241 by Janan Halter, RN Outcome: Progressing   Problem: Elimination: Goal: Will not experience complications related to bowel motility 06/29/2019 1322 by Janan Halter, RN Outcome: Adequate for Discharge 06/29/2019 1241 by Janan Halter, RN Outcome: Progressing Goal: Will not experience complications related to urinary retention 06/29/2019 1322 by Janan Halter, RN Outcome: Adequate for Discharge 06/29/2019 1241 by Janan Halter, RN Outcome: Progressing   Problem: Pain Managment: Goal: General experience of comfort will improve 06/29/2019 1322 by Janan Halter, RN Outcome: Adequate for Discharge 06/29/2019 1241 by Janan Halter, RN Outcome: Progressing   Problem: Safety: Goal: Ability to remain free from injury will improve 06/29/2019 1322 by Janan Halter, RN Outcome: Adequate for Discharge 06/29/2019 1241 by Janan Halter, RN Outcome: Progressing   Problem: Skin Integrity: Goal: Risk for impaired skin integrity will decrease 06/29/2019 1322 by Janan Halter, RN Outcome: Adequate for Discharge 06/29/2019 1241 by Janan Halter, RN Outcome: Progressing

## 2019-06-29 NOTE — Plan of Care (Signed)

## 2019-07-06 ENCOUNTER — Encounter (HOSPITAL_BASED_OUTPATIENT_CLINIC_OR_DEPARTMENT_OTHER): Payer: Medicare PPO | Admitting: Physician Assistant

## 2019-07-13 ENCOUNTER — Other Ambulatory Visit: Payer: Self-pay

## 2019-07-13 ENCOUNTER — Encounter (HOSPITAL_BASED_OUTPATIENT_CLINIC_OR_DEPARTMENT_OTHER): Payer: Medicare PPO | Attending: Physician Assistant | Admitting: Physician Assistant

## 2019-07-13 DIAGNOSIS — L97822 Non-pressure chronic ulcer of other part of left lower leg with fat layer exposed: Secondary | ICD-10-CM | POA: Insufficient documentation

## 2019-07-13 DIAGNOSIS — L97522 Non-pressure chronic ulcer of other part of left foot with fat layer exposed: Secondary | ICD-10-CM | POA: Diagnosis not present

## 2019-07-13 DIAGNOSIS — F418 Other specified anxiety disorders: Secondary | ICD-10-CM | POA: Diagnosis not present

## 2019-07-13 DIAGNOSIS — K449 Diaphragmatic hernia without obstruction or gangrene: Secondary | ICD-10-CM | POA: Diagnosis not present

## 2019-07-13 DIAGNOSIS — E1140 Type 2 diabetes mellitus with diabetic neuropathy, unspecified: Secondary | ICD-10-CM | POA: Insufficient documentation

## 2019-07-13 DIAGNOSIS — E11622 Type 2 diabetes mellitus with other skin ulcer: Secondary | ICD-10-CM | POA: Insufficient documentation

## 2019-07-13 DIAGNOSIS — E11621 Type 2 diabetes mellitus with foot ulcer: Secondary | ICD-10-CM | POA: Insufficient documentation

## 2019-07-13 DIAGNOSIS — I872 Venous insufficiency (chronic) (peripheral): Secondary | ICD-10-CM | POA: Diagnosis not present

## 2019-07-13 DIAGNOSIS — L97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 leg with fat layer exposed: Secondary | ICD-10-CM | POA: Diagnosis not present

## 2019-07-13 DIAGNOSIS — I10 Essential (primary) hypertension: Secondary | ICD-10-CM | POA: Insufficient documentation

## 2019-07-13 DIAGNOSIS — I890 Lymphedema, not elsewhere classified: Secondary | ICD-10-CM | POA: Insufficient documentation

## 2019-07-13 DIAGNOSIS — R531 Weakness: Secondary | ICD-10-CM | POA: Insufficient documentation

## 2019-07-13 DIAGNOSIS — F329 Major depressive disorder, single episode, unspecified: Secondary | ICD-10-CM | POA: Diagnosis not present

## 2019-07-13 DIAGNOSIS — Z6841 Body Mass Index (BMI) 40.0 and over, adult: Secondary | ICD-10-CM | POA: Diagnosis not present

## 2019-07-13 DIAGNOSIS — I87333 Chronic venous hypertension (idiopathic) with ulcer and inflammation of bilateral lower extremity: Secondary | ICD-10-CM | POA: Diagnosis not present

## 2019-07-13 DIAGNOSIS — Z8673 Personal history of transient ischemic attack (TIA), and cerebral infarction without residual deficits: Secondary | ICD-10-CM | POA: Diagnosis not present

## 2019-07-13 DIAGNOSIS — M109 Gout, unspecified: Secondary | ICD-10-CM | POA: Insufficient documentation

## 2019-07-13 DIAGNOSIS — M5432 Sciatica, left side: Secondary | ICD-10-CM | POA: Insufficient documentation

## 2019-07-13 NOTE — Progress Notes (Addendum)
[name], [name] ([id number]) Visit Report for 07/13/2019 Chief Complaint Document Details Patient Name: Date of Service: NAVA, SONG 07/13/2019 9:45 AM Medical Record [id number] Patient Account Number: [id number] Date of Birth/Sex: Treating RN: 02-13-1951 (69 y.o. F) Primary Care Provider: [name] Other Clinician: Referring Provider: Treating Provider/Extender:Stone III, [name], FRED Weeks in Treatment: 101 Information Obtained from: Patient Chief Complaint Bilateral reoccurring LE ulcers Electronic Signature(s) Signed: 07/13/2019 9:53:31 AM By: [name] PA-C Entered By: [name] on 07/13/2019 09:53:30 -------------------------------------------------------------------------------- HPI Details Patient Name: Date of Service: [name]. 07/13/2019 9:45 AM Medical Record [id number] Patient Account Number: [id number] Date of Birth/Sex: Treating RN: 22-Jan-1951 (68 y.o. F) Primary Care Provider: [name] Other Clinician: Referring Provider: Treating Provider/Extender:Stone III, [name], FRED Weeks in Treatment: 101 History of Present Illness HPI Description: this patient has been seen a couple of times before and returns with recurrent problems to her right and left lower extremity with swelling and weeping ulcerations due to not wearing her compression stockings which she had been advised to do during her last discharge, at the end of June 2018. During her last visit the patient had had normal arterial blood flow and her venous reflux study did not necessitate any surgical intervention. She was recommended compression and elevation and wound care. After prolonged treatment the patient was completely healed but she has been noncompliant with wearing or compressions.. She was here last week with an outpatient return visit planned but the patient came in a very poor general condition with altered mental status and was rushed to the ER  on my request. With a history of hypertension, diabetes, TIA and right-sided weakness she was set up for an MRI on her brain and cervical spine and was sent to [facility]. Getting an MRI done was very difficult but once the workup was done she was found not to have any spinal stenosis, epidural abscess or hematoma or discitis. This was radiculopathy to be treated as an outpatient and she was given a follow-up appointment. Today she is feeling much better alert and oriented and has come to reevaluate her bilateral lower extremity lymphedema and ulceration 03/25/2017 -- she was admitted to the hospital on 03/16/2017 and discharged on 03/18/2017 with left leg cellulitis and ulceration. She was started on vancomycin and Zosyn and x-ray showed no bony involvement. She was treated for a cellulitis with IV antibiotics changed to Rocephin and Flagyl and was discharged on oral Keflex and doxycycline to complete a 7 day course. Last hemoglobin A1c was 7.1 and her other ailments including hypertension got asthma were appropriately treated. 05/06/2017 -- she is awaiting the right size of compression stockings from [place] but other than that has been doing well. ====== Old notes 69 year old patient was seen one time last October and was lost to follow-up. She has recurrent problems with weeping and ulceration of her left lower extremity and has swelling of this for several years. It has been worse for the last 2 months. Past medical history is significant for diabetes mellitus type 2, hypertension, gout, morbid obesity, depressive disorders, hiatal hernia, migraines, status post knee surgery, risk of a cholecystectomy, vaginal hysterectomy and breast biopsy. She is not a smoker. As noted before she has never had a venous duplex study and an arterial ABI study was attempted but the left lower extremity was noncompressible 10/01/2016 -- had a lower extremity venous duplex reflux evaluation which showed no  evidence of deep vein reflux in the right or  left lower extremity, and no evidence of great saphenous vein reflux more than 500 ms in the right or left lower extremity, and the left small saphenous vein is incompetent but no vascular consult was recommended. review of her electronic medical records noted that the ABI was checked in July 2017 where the right ABI was normal limits and the left ABI could not be ascertained due to pain with cuff pressure but the waveforms are within normal limits. her arterial duplex study scheduled for April 27. 10/08/2016 -- the patient has various reasons for not having a compression on and for the last 3 days she has had no compression on her left lower extremity either due to pain or the lack of nursing help. She does not use her juxta lites either. 10/15/2016 -- the patient did not keep her appointment for arterial duplex study on April 27 and I have asked her to reschedule this. Her pain is out of proportion with the physical findings and she continuously fails to wear a compression wraps and cuts them off because she says she cannot tolerate the pain. She does not use her juxta lites either. 10/22/2016 -- he has rescheduled her arterial duplex study to May 21 and her pain today is a bit better. She has not been wearing her juxta lites on her right lower extremity but now understands that she needs to do this. She did tolerate the to press compression wrap on her left lower extremity 10/29/2016 --arterial duplex study is scheduled for next week and overall she has been tolerating her compression wraps and also using her juxta lites on her right lower extremity 11/05/2016 -- the right ABI was 0.95 the left was 1.03. The digit TBI is on the right was 0.83 on the left was 0.92 and she had biphasic flow through these vessels. The impression was that of normal lower extremity arterial study. 11/12/2016 -- her pain is minimal and she is doing very well  overall. 11/26/2016 -- she has got juxta lites and her insurance will not pay for additional dual layer compression stockings. She is going to order some from [employer]. 05/12/2017 -- her juxta lites are very old and too big for her and these have not been helping with compression. She did get 20-30 mm compression stockings from [place] but she and her husband are unable to put these on. I believe she will benefit from bilateral Extremit-ease, compression stockings and we will measure her for these today. 05/20/2017 -- lymphedema on the left lower extremity has increased a lot and she has a open ulceration as a result of this. The right lower extremity is looking pretty good. She has decided to by the compression stockings herself and will get reimbursed by the home health, at a later date. 05/27/2017 -- her sciatica is bothering her a lot and she thought her left leg pain was caused due to the compression wrap and hence removed it and has significant lymphedema. There is no inflammation on this left lower extremity. 06/17/17 on evaluation today patient appears to be doing very well and in fact is completely healed in regard to her ulcerations. Unfortunately however she does have continued issues with lymphedema nonetheless. We did order compression garments for her unfortunately she states that the size that she received were large although we ordered medium. Obviously this means she is not getting the optimal compression. She does not have those with her today and therefore we could not confirm and contact the company on her  behalf. Nonetheless she does state that she is going to have her husband bring them by tomorrow so that we can verify and then get in touch with the company. No fevers, chills, nausea, or vomiting noted at this time. Overall patient is doing better otherwise and I'm pleased with the progress she has made. 07/01/17 on evaluation today patient appears to be doing very well in  regard to her bilateral lower extremity she does not have any openings at this point which is excellent news. Overall I'm pleased with how things have progressed up to this time. Since she is doing so well we did order her compression which we are seeing her today to ensure that it fits her properly and everything is doing well in that regard and then subsequently she will be discharged. ============ Old Notes: 03/31/16 patient presents today for evaluation concerning open wounds that she has over the left medial ankle region as well as the left dorsal foot. She has previously had this occur although it has been healed for a number of months after having this for about a year prior until her hospitalization on 01/05/16. At that point in time it appears that she was admitted to the hospital for left lower extremity cellulitis and was placed on vancomycin and Zosyn at that point. Eventually upon discharge on January 15, 2016 she was placed on doxycycline at that point in time. Later on 03/27/16 positive wound culture growing Escherichia coli this was switched to amoxicillin. Currently she tells me that she is having pain radiated to be a 7 out of 10 which can be as high as 10 out of 10 with palpation and manipulation of the wound. This wound appears to be mainly venous in nature due to the bilateral lower extremity venous stasis/lymphedema. This is definitely much worse on her left than the right side. She does have type 1 diabetes mellitus, hypertension, morbid obesity, and is wheelchair dependent.during the course of the hospital stay a blood culture was also obtained and fortunately appeared negative. She also had an x-ray of the tibia/fibula on the left which showed no acute bone abnormality. Her white blood cell count which was performed last on 03/25/16 was 7.3, hemoglobin 12.8, protein 7.1, albumin 3.0. Her urine culture appeared to be negative for any specific organisms. Patient did have a left  lower extremity venous duplex evaluation for DVT. This did not include venous reflux studies but fortunately was negative for DVT. Patient also had arterial studies performed which revealed that she had a normal ABI on the right though this was unable to be performed on the left secondary to pain that she was having around the ankle region due to the wound. However it was stated on report that she had biphasic pulses and apparently good blood flow. ========== 06/03/17 she is here in follow-up evaluation for right lower extremity ulcer. The right lower sure he has healed but she has reopened to the left medial malleolus and dorsal foot with weeping. She is waiting for new compression garments to arrive from home health, the previous compression garments were ill fitting. We will continue with compression bilaterally and follow-up in 2 weeks Readmission: 08/05/17 on evaluation today patient appears to be doing somewhat poorly in regard to her left lower extremity especially although the right lower extremity has a small area which may no longer be open. She has been having a lot of drainage from the left lower extremity however he tells me that she has not been  able to use the EXTREMIT- EASE Compression at this point. She states that she did better and was able to actually apply the Juxta-Lite compression although the wound that she has is too large and therefore really does not compress which is why she cannot wear it at this point. She has no one who can help her put it on regular basis her son can sometimes but he's not able to do it most of the time. I do believe that's why she has begun to weave and have issues as she is currently yet again. No fevers, chills, nausea, or vomiting noted at this time. Patient is no evidence of dementia. 08/12/17 on evaluation today patient appears to still be doing fairly well in regard to the draining areas/weeping areas at this point. With that being said she  unfortunately did go to the ER yesterday due to what was felt to be possibly a cellulitis. They place her on doxycycline by mouth and discharge her home. She definitely was not admitted. With that being said she states she has had more discomfort which has been unusual for her even compared to prior times and she's had infections.08/12/17 on evaluation today patient appears to still be doing fairly well in regard to the draining areas/weeping areas at this point. With that being said she unfortunately did go to the ER yesterday due to what was felt to be possibly a cellulitis. They place her on doxycycline by mouth and discharge her home. She definitely was not admitted. With that being said she states she has had more discomfort which has been unusual for her even compared to prior times and she's had infections. 08/19/17 put evaluation today patient tells me that she's been having a lot of what sounds to be neuropathic type pain in regard to her left lower extremity. She has been using over-the-counter topical bins again which some believe. That in order to apply the she actually remove the wrap we put on her last Wednesday on Thursday. Subsequently she has not had anything on compression wise since that time. The good news is a lot of the weeping areas appear to have closed at this point again I believe she would do better with compression but we are struggling to get her to actually use what she needs to at this point. No fevers, chills, nausea, or vomiting noted at this time. 09/03/17 on evaluation today patient appears to be doing okay in regard to her lower extremities in regard to the lymphedema and weeping. Fortunately she does not seem to show any signs of infection at this point she does have a little bit of weeping occurring in the right medial malleolus area. With that being said this does not appear to be too significant which is good news. 09/10/17; this is a patient with severe bilateral  secondary lymphedema secondary to chronic venous insufficiency. She has severe skin damage secondary to both of these features involving the dorsal left foot and medial left ankle and lower leg. Still has open areas in the left anterior foot. The area on the right closed over. She uses her own juxta light stockings. She does not have an arterial issue 09/16/17 on evaluation today patient actually appears to be doing excellent in regard to her bilateral lower extremity swelling. The Juxta-Lite compression wrap seem to be doing very well for her. She has not however been using the portion that goes over her foot. Her left foot still is draining a little bit not nearly  as significant as it has been in the past but still I do believe that she likely needs to utilize the full wrap including the foot portion of this will improve as well. She also has been apparently putting on a significant amount of Vaseline which also think is not helpful for her. I recommended that if she feels she needs something for moisturizer Eucerin will probably be better. 09/30/17 on evaluation today patient presents with several new open areas in regard to her left lower extremity although these appear to be minimal and mainly seem to be more moisture breakdown than anything. Fortunately she does not seem to have any evidence of infection which is great news. She has been tolerating the dressing changes without complication we are using silver alginate on the foot she has been using AB pads to have the legs and using her Juxta-Lite compression which seems to be controlling her swelling very well. Overall I'm pleased with the poor way she has progressed. 10/14/17 on evaluation today patient appears to be doing better in regard to her left lower extremity areas of weeping. She does still have some discomfort although in general this does not appear to be as macerated and I think it is progressing nicely. I do think she still needs to  wear the foot portion of her Juxta-Lite in order to get the most benefit from the wrap obviously. She states she understands. Fortunately there does not appear to be evidence of infection at this time which is great news. 10/28/17 on evaluation today patient appears to be doing excellent in regard to her left lower extremity. She has just a couple areas that are still open and seem to be causing any trouble whatsoever. For that reason I think that she is definitely headed in the right direction the spots are very tiny compared to what we have been dealing with in the past. 11/11/17 on evaluation today patient appears to have a right lateral lower extremity ulcer that has opened since I last saw her. She states this is where the home health nurse that was coming out remove the dressing without wetting the alginate first. Nonetheless I do not know if this is indeed the case or not but more importantly we have not ordered home help to be coming out for her wounds at all. I'm unsure as to why they are coming out and we're gonna have to check on this and get things situated in that regard. With that being said we currently really do not need them to be coming out as the patient has been taking care of her leg herself without complication and no issues. In fact she was doing much better prior to nursing coming out. 11/25/17 on evaluation today patient actually appears to be doing fairly well in regard to her left lower extremity swelling. In fact she has very little area of weeping at this point there's just a small spot on the lateral portion of her right leg that still has me just a little bit more concerned as far as wanting to see this clear up before I discharge her to caring for this at home. Nonetheless overall she has made excellent progress. 12/09/17 on evaluation today patient appears to be doing rather well in regard to her lower extremity edema. She does have some weeping still in the left lower  extremity although the big area we were taking care of two weeks ago actually has closed and she has another area of weeping on  the left lower extremity immediately as well is the top of her foot. She does not currently have lymphedema pumps she has been wearing her compression daily on a regular basis as directed. With that being said I think she may benefit from lymphedema pumps. 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 We had a very difficult time getting and keeping this under control. 12/23/17 on evaluation today patient actually appears to be doing a little bit more poorly in regard to her bilateral lower extremities. She has been tolerating the Juxta-Lite compression wraps. Unfortunately she has two new ulcers on the right lower extremity and left lower Trinity ulceration seems to be larger. Obviously this is not good news. She has been tolerating the dressings without complication. 12/30/17 on evaluation today patient actually appears to be doing much better in regard to her bilateral lower extremity edema. She continues to have some issues with ulcerations and in fact there appears to be one spot on each leg where the wrap may have caused a little bit of a blister which is subsequently opened up at this point is given her pain. Fortunately it does not appear to be any evidence of infection which is good news. No fevers chills noted. 01/13/18 on evaluation today patient appears to be doing rather well in regard to her bilateral lower extremities. The dressings did get kind of stuck as far as the wound beds are concerned but again I think this is mainly due to the fact that she actually seems to be showing signs of healing which is good news. She's not having as much drainage therefore she was having more of the dressing sticking.  Nonetheless overall I feel like her swelling is dramatically down compared to previous. 01/20/18 on evaluation today patient unfortunately though she's doing better in most regards has a large blister on the left anterior lower extremity where she is draining quite significantly. Subsequently this is going to need debridement today in order to see what's underneath and ensure she does not continue to trapping fluid at this location. Nonetheless No fevers, chills, nausea, or vomiting noted at this time. 01/27/18 on evaluation today patient appears to be doing rather well at this point in regard to her right lower extremity there's just a very small area that she still has open at this point. With that being said I do believe that she is tolerating the compression wraps very well in making good progress. Home health is coming out at this point to see her. Her left lower extremity on the lateral portion is actually what still mainly open and causing her some discomfort for the most part 02/10/18 on evaluation today patient actually appears to be doing very well in regard to her right lower extremity were all the ulcers appear to be completely close. In regard to the left lower extremity she does have two areas still open and some leaking from the dorsal surface of her foot but this still seems to be doing much better to me in general. 02/24/18 on evaluation today patient actually appears to be doing much better in regard to her right lower extremity this is still completely healed. Her left lower extremity is also doing much better fortunately she has no evidence of infection. The one area that is gonna require some debridement is still on the left anterior  shin. Fortunately this is not hurting her as badly today. 03/10/18 on evaluation today patient appears to be doing better in some regards although she has a little bit more open area on the dorsal foot and she also has some issues on the medial portion  of the left lower extremity which is actually new and somewhat deep. With that being said there fortunately does not appear to be any significant signs of infection which is good news. No fevers, chills, nausea, or vomiting noted at this time. In general her swelling seems to be doing fairly well which is good news. 03/31/18 on evaluation today patient presents for follow-up concerning her left lower extremity lymphedema. Unfortunately she has been doing a little bit more poorly since I last saw her in regard to the amount of weeping that she is experiencing. She's also having some increased pain in the anterior shin location. Unfortunately I do not feel like the patient is making such good progress at this point a few weeks back she was definitely doing much better. 04/07/18 on evaluation today patient actually appears to be showing some signs of improvement as far as the left lower extremity is concerned. She has been tolerating the dressing changes and it does appear that the Drawtex did better for her. With that being said unfortunately home health is stating that they cannot obtain the Drawtex going forward. Nonetheless we're gonna have to check and see what they may be able to get the alginate they were using was getting stuck in causing new areas of skin being pulled all that with and subsequently weep and calls her to worsen overall this is the first time we've seen improvement at this time. 04/14/18 on evaluation today patient actually appears to be doing rather well at this point there does not appear to be any evidence of infection at this time and she is actually doing excellent in regard to the weeping in fact she almost has no openings remaining even compared to just last week this is a dramatic improvement. No fevers chills noted 04/21/18 evaluation today patient actually appears to be doing very well. She in fact is has a small area on the posterior lower extremity location and she  has a small area on the dorsal surface of her foot that are still open both of which are very close to closing. We're hoping this will be close shortly. She brought her Juxta-Lite wrap with her today hoping that would be able to put her in it unfortunately I don't think were quite at that point yet but we're getting closer. 04/28/18 upon evaluation today patient actually appears to be doing excellent in regard to her left lower extremity ulcer. In fact the region on the posterior lower extremity actually is much smaller than previously noted. Overall I'm very happy with the progress she has made. She again did bring her Juxta-Lite although we're not quite ready for that yet. 05/11/18 upon evaluation today patient actually appears to be doing in general fairly well in regard to her left lower Trinity. The swelling is very well controlled. With that being said she has a new area on the left anterior lower extremity as well as between the first and second toes of her left foot that was not present during the last evaluation. The region of her posterior left lower extremity actually appears to be almost completely healed. To be honest I'm very pleased with the way that stands. Nonetheless I do believe that the lotion may  be keeping the area to moist as far as her legs are concerned subsequently I'm gonna consider discontinuing that today. 05/26/18 on evaluation today patient appears to be doing rather well in regard to her left lower should be ulcers. In fact everything appears to be close except for a very small area on the left posterior lower extremity. Fortunately there does not appear to be any evidence of infection at this time. Overall very pleased with her progress. 06/02/18 and evaluation today patient actually appears to be doing very well in regard to her lower extremity ulcers. She has one small area that still continues to weep that I think may benefit her being able to justify lotion and  user Juxta-Lite wraps versus continued to wrap her. Nonetheless I think this is something we can definitely look into at this point. 06/23/18 on evaluation today patient unfortunately has openings of her bilateral lower extremities. In general she seems to be doing much worse than when I last saw her just as far as her overall health standpoint is concerned. She states that her discomfort is mainly due to neuropathy she's not having any other issues otherwise. No fevers, chills, nausea, or vomiting noted at this time. 06/30/18 on evaluation today patient actually appears to be doing a little worse in regard to her right lower extremity her left lower extremity of doing fairly well. Fortunately there is no sign of infection at this time. She has been tolerating the dressing changes without complication. Home health did not come out like they were supposed to for the appropriate wrap changes. They stated that they never received the orders from [place] which were fax. Nonetheless we will send a copy of the orders with the patient today as well. 07/07/18 on evaluation today patient appears to be doing much better in regard to lower extremities. She still has several openings bilaterally although since I last saw her her legs did show obvious signs of infection when she later saw her nurse. Subsequently a culture was obtained and she is been placed on Bactrim and Keflex. Fortunately things seem to be looking much better it does appear she likely had an infection. Again last week we'd even discussed it but again there really was not any obvious sign that she had infection therefore we held off on the antibiotics. Nonetheless I'm glad she's doing better today. 07/14/18 on evaluation today patient appears to be doing much better regarding her bilateral lower Trinity's. In fact on the right lower for me there's nothing open at this point there are some dry skin areas at the sites where she had infection.  Fortunately there is no evidence of systemic infection which is excellent news. No fevers chills noted 07/21/18 on evaluation today patient actually appears to be doing much better in regard to her left lower extremity ulcers. She is making good progress and overall I feel like she's improving each time I see her. She's having no pain I do feel like the infection is completely resolved which is excellent news. No fevers, chills, nausea, or vomiting noted at this time. 07/28/18 on evaluation today patient appears to be doing very well in regard to her left lower [employer]. Everything seems to be showing signs of improvement which is excellent news. Overall very pleased with the progress that has been made. Fortunately there's no evidence of active infection at this time also excellent news. 08/04/18 on evaluation today patient appears to be doing more poorly in regard to her bilateral lower  extremities. She has two new areas open up on the right and these were completely closed as of last week. She still has the two spots on the left which in my pinion seem to be doing better. Fortunately there's no evidence of infection again at this point. 08/11/18 on evaluation today patient actually appears to be doing very well in regard to her bilateral lower Trinity wounds that all seem to be doing better and are measures smaller today. Fortunately there's no signs of infection. No fevers, chills, nausea, or vomiting noted at this time. 08/18/18 on evaluation today patient actually appears to be doing about the same inverter bilateral lower extremities. She continues to have areas that blistering open as was drain that fortunately nothing too significant. Overall I feel like Drawtex may have done better for her however compared to the collagen. 08/25/18 on evaluation today patient appears to be doing a little bit more poorly today even compared to last time I saw her. Again I'm not exactly sure why she's  making worse progress over the past several weeks. I'm beginning to wonder if there is some kind of underlying low level infection causing this issue. I did actually take a culture from the left anterior lower extremity but it was a new wound draining quite a bit at this point. Unfortunately she also seems to be having more pain which is what also makes me worried about the possibility of infection. This is despite never erythema noted at this point. 09/01/18 on evaluation today patient actually appears to be doing a little worse even compared to last week in regard to bilateral lower extremities. She did go to the hospital on the 16th was given a dose of IV Zosyn and then discharged with a recommendation to continue with the Bactrim that I previously prescribed for her. Nonetheless she is still having a lot of discomfort she tells me as well at this time. This is definitely unfortunate. No fevers, chills, nausea, or vomiting noted at this time. 09/08/18 on evaluation today patient's bilateral lower extremities actually appear to be shown signs of improvement which is good news. Fortunately there does not appear to be any signs of active infection I think the anabiotic is helping in this regard. Overall I'm very pleased with how she is progressing. 09/15/18 patient was actually seen in ER yesterday due to her legs as well unfortunately. She states she's been having a lot of pain and discomfort as well as a lot of drainage. Upon inspection today the patient does have a lot of swelling and drainage I feel like this is more related to lymphedema and poor fluid control than it is to infection based on what I'm seeing. The physician in the emergency department also doubted that the patient was having a significant infection nonetheless I see no evidence of infection obvious at this point although I do see evidence of poor fluid control. She still not using a compression pumps, she is not elevating due to  her lift chair as well as her hospital bed being broken, and she really is not keeping her legs up as much as they should be and also has been taking off her wraps. All this combined I think has led to poor fluid control and to be honest she may be somewhat volume overloaded in general as well. I recommend that she may need to contact your physician to see if a prescription for a diuretic would be beneficial in their opinion. As long as this  is safe I think it would likely help her. 09/29/18 on evaluation today patient's left lower extremity actually appears to be doing quite a bit better. At least compared to last time that I saw her. She still has a large area where she is draining from but there's a lot of new skin speckled trout and in fact there's more new skin that there are open areas of weeping and drainage at this point. This is good news. With regard to the right lower extremity this is doing much better with the only open area that I really see being a dry spot on the right lateral ankle currently. Fortunately there's no signs of active infection at this time which is good news. No fevers, chills, nausea, or vomiting noted at this time. The patient seems somewhat stressed and overwhelmed during the visit today she was very lethargic as such. She does and she is not taking any pain medications at this point. Apparently according to her husband are also in the process of moving which is probably taking its toll on her as well. 10/06/18 on evaluation today patient appears to be doing rather well in regard to her lower extremities compared to last evaluation. Fortunately there's no signs of active infection. She tells me she did have an appointment with her primary. Nonetheless he was concerned that the wounds were somewhat deep based on pictures but we never actually saw her legs. She states that he had her somewhat worried due to the fact that she was fearing now that she was [place] have to  have an amputation. With that being said based on what I'm seeing check she looks better this week that she has the last two times I've seen her with much less drainage I'm actually pleased in this regard. That doesn't mean that she's out of the water but again I do not think what the point of talking about education at all in regard to her leg. She is very happy to hear this. She is also not having as much pain as she was having last week. 10/13/18 unfortunately on evaluation today patient still continues to have a significant amount of drainage she's not letting home health actually apply the compression dressings at this point. She's trying to use of Juxta-Lite of the top of Kerlex and the second layer of the three layer compression wrap. With that being said she just does not seem to be making as good a progress as I would expect if she was having the compression applied and in place on a regular basis. No fevers, chills, nausea, or vomiting noted at this time. 10/20/18 on evaluation today patient appears to be doing a little better in regard to her bilateral lower extremity ulcers. In fact the right lower extremity seems to be healed she doesn't even have any openings at this point left lower extremity though still somewhat macerated seems to be showing signs of new skin growth at multiple locations throughout. Fortunately there's no evidence of active infection at this time. No fevers, chills, nausea, or vomiting noted at this time. 10/27/18 on evaluation today patient appears to be doing much better in regard to her left lower Trinity ulcer. She's been tolerating the laptop complication and has minimal drainage noted at this point. Fortunately there's no signs of active infection at this time. No fevers, chills, nausea, or vomiting noted at this time. 11/03/18 on evaluation today patient actually appears to be doing excellent in regard to her left lower extremity.  She is having very little  drainage at this point there does not appear to be any significant signs of infection overall very pleased with how things have gone. She is likewise extremely pleased still and seems to be making wonderful progress week to week. I do believe antibiotics were helpful for her. Her primary care provider did place on amateur clean since I last saw her. 11/17/18 on evaluation today patient appears to be doing worse in regard to her bilateral lower extremities at this point. She is been tolerating the dressing changes without complication. With that being said she typically takes the Coban off fairly quickly upon arriving home even after being seen here in the clinic and does not allow home health reapply command as part of the dressing at home. Therefore she said no compression essentially since I last saw her as best I can tell. With that being said I think it shows and how much swelling she has in the open wounds that are noted at this point. Fortunately there's no signs of infection but unfortunately if she doesn't get this under control I think she will end up with infection and more significant issues. 11/24/18 on evaluation today patient actually appears to be doing somewhat better in regard to her bilateral lower extremities. 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 Nonetheless that is doing better currently so she should be able to attempt reinitiating the lymphedema pumps at this time. No fevers, chills, nausea, or vomiting noted at this time. 12/01/18 upon evaluation today patient's left lower extremity appears to be doing quite well unfortunately her right lower extremity is not doing nearly as well. She has been tolerating the dressing changes without complication unfortunately she did not keep a wrap on the right at this time. Nonetheless I believe  this has led to increased swelling and weeping in the world is actually much larger than during the last evaluation with her. 12/08/18 on evaluation today patient appears to be doing about the same at this point in regard to her right lower extremity. There is some more palatable to touch I'm concerned about the possibility of there being some infection although I think the main issue is she's not keeping her compression wrap on which in turn is not allowing this area to heal appropriately. 12/22/18 on evaluation today patient appears to be doing better in regard to left lower extremity unfortunately significantly worse in regard to the right lower extremity. The areas of blistering and necrotic superficial tissue have spread and again this does not really appear to be signs of infection and all she just doesn't seem to be doing nearly as well is what she has been in the past. Overall I feel like the Augmentin did absolutely nothing for her she doesn't seem to have any infection again I really didn't think so last time either is more of a potential preventative measure and hoping that this would make some difference but I think the main issue is she's not wearing her compression. She tells me she cannot wear the [place] been we put on she takes it off pretty much upon getting home. Subsequently she worshiped Juxta-Lite when I questioned her about how often she wears it this is no more than three hours a day obviously that leaves 21 hours that she has  no compression and this is obviously not doing well for her. Overall I'm concerned that if things continue to worsen she is at great risk of both infection as well as losing her leg. 01/05/19 on evaluation today patient appears to be doing well in regard to her left lower extremity which he is allowing [place] to wrap and not so well with regard to her right lower extremity which she is not allowing [place] to really wrap and keep the wrap on. She states that it  hurts too badly whenever it's wrapped and she ends up having to take it off. She's been using the Juxta-Lite she tells me up to six hours a day although I question whether or not that's really been the case to be honest. Previously she told me three hours today nonetheless obviously the legs as long as the wrap is doing great when she is not is doing much more poorly. 01/12/2019 on evaluation today patient actually appears to be doing a little better in my opinion with regard to her right lower extremity ulcer. She has a small open area on the left lower extremity unfortunately but again this I think is part of the normal fluctuation of what she is going to have to expect with regard to her legs especially when she is not using her lymphedema pumps on a regular basis. Subsequently based on what I am seeing today I think that she does seem to be doing slightly better with regard to her right lower extremity she did see her primary care provider on Monday they felt she had an infection and placed her on 2 antibiotics. Both Cipro and clindamycin. Subsequently again she seems possibly to be doing a little bit better in regards to the right lower extremity she also tells me however she has been wearing the compression wrap over the past week since I spoke with her as well that is a Kerlix and Coban wrap on the right. No fevers, chills, nausea, vomiting, or diarrhea. 01/19/2019 on evaluation today patient appears to be doing better with regard to her bilateral lower extremities especially the right. I feel like the compression has been beneficial for her which is great news. She did get a call from her primary care provider on her way here today telling her that she did have methicillin-resistant Staphylococcus aureus and he was calling in a couple new antibiotics for her including a ointment to be applied she tells me 3 times a day. With that being said this sounds like likely to be Bactroban which I think  could be applied with each dressing/wrap change but I would not be able to accommodate her applying this 3 times a day. She is in agreement with the least doing this we will add that to her orders today. 01/26/2019 on evaluation today patient actually appears to be doing much better with regard to her right lower extremity. Her left lower extremity is also doing quite well all things considering. Fortunately there is no evidence of active infection at this time. No fevers, chills, nausea, vomiting, or diarrhea. 02/02/2019 on evaluation today patient appears to be doing much better compared to her last evaluation. Little by little off like her right leg is returning more towards normal. There does not appear to be any signs of active infection and overall she seems to be doing quite well which is great news. I am very pleased in this regard. No fevers, chills, nausea, vomiting, or diarrhea. 02/09/2019 upon evaluation today patient  appears to be doing better with regard to her bilateral lower extremities. She has been tolerating the dressing changes without complication. Fortunately there is no signs of active infection at this time. No fevers, chills, nausea, vomiting, or diarrhea. 02/23/2019 on evaluation today patient actually appears to be doing quite well with regard to her bilateral lower extremities. She has been tolerating the dressing changes without complication. She is even used her pumps one time and states that she really felt like it felt good. With that being said she seems to be in good spirits and her legs appear to be doing excellent. 03/09/2019 on evaluation today patient appears to be doing well with regard to her right lower extremity there are no open wounds at this time she is having some discomfort but I feel like this is more neuropathy than anything. With regard to her left lower extremity she had several areas scattered around that she does have some weeping and drainage from  but again overall she does not appear to be having any significant issues and no evidence of infection at this time which is good news. 03/23/2019 on evaluation today patient appears to be doing well with regard to her right lower extremity which she tells me is still close she is using her juxta light here. Her left lower extremity she mainly just has an area on the foot which is still slightly draining although this also is doing great. Overall very pleased at this time. 04/06/2019 patient appears to be doing a little bit worse in regard to her left lower extremity upon evaluation today. She feels like this could be becoming infected again which she had issues with previous. Fortunately there is no signs of systemic infection but again this is always a struggle with her with her legs she will go from doing well to not so well in a very short amount of time. 04/20/2019 on evaluation today patient actually appears to be doing quite well with regard to her right lower extremity I do not see any signs of active infection at this time. Fortunately there is no fever chills noted. She is still taking the antibiotics which I prescribed for her at this point. In regard to the left lower extremity I do feel like some of these areas are better although again she still is having weeping from several locations at this time. 04/27/2019 on evaluation today patient appears to be doing about the same if not slightly worse in regard to her left lower extremity ulcers. She tells me when questioned that she has been sleeping in her Hoveround chair in fact she tells me she falls asleep without even knowing it. I think she is spending a whole lot of time in the chair and less time walking and moving around which is not good for her legs either. On top of that she is in a seated position which is also the worst position she is not really elevating her legs and she is also not using her lymphedema pumps. All this is good  to contribute to worsening of her condition in general. 05/18/2019 on evaluation today patient appears to be doing well with regard to her lower extremity on the right in fact this is showing no signs of any open wounds at this time. On the left she is continuing to have issues with areas that do drain. Some of the regions have healed and there are couple areas that have reopened. She did go to the ER per the  patient according to recommendations from the home health nurse due to what she was seen when she came out on 05/13/2019. Subsequently she felt like the patient needed to go to the hospital due to the fact that again she was having "milky white discharge" from her leg. Nonetheless she had and then was placed on doxycycline and subsequently seems to be doing better. 06/01/2019 upon evaluation today patient appears to be doing really in my opinion about the same. I do not see any signs of active infection which is good news. Overall she still has wounds over the bilateral lower extremities she has reopened on the right but this appears to be more of a crack where there is weeping/edema coming from the region. I do not see any evidence of infection at either site based on what I visualized today. 07/13/2019 upon evaluation today patient appears to be doing a little worse compared to last time I saw her. She since has been in the hospital from 06/21/2019 through 06/29/2019. This was secondary to having Covid. During that time they did apply lotion to her legs which unfortunately has caused her to develop a myriad of open wounds on her lower extremities. Her legs do appear to be doing better as far as the overall appearance is concerned but nonetheless she does have more open and weeping areas. Electronic Signature(s) Signed: 07/13/2019 10:59:21 AM By: [name] PA-C Entered By: [name] on 07/13/2019  10:59:21 -------------------------------------------------------------------------------- Physical Exam Details Patient Name: Date of Service: [name], [name] 07/13/2019 9:45 AM Medical Record [id number] Patient Account Number: [id number] Date of Birth/Sex: Treating RN: [birth year] (69 y.o. F) Primary Care Provider: [name] Other Clinician: Referring Provider: Treating Provider/Extender:Stone III, [name], FRED Weeks in Treatment: 52 Constitutional Well-nourished and well-hydrated in no acute distress. Psychiatric this patient is able to make decisions and demonstrates good insight into disease process. Alert and Oriented x 3. pleasant and cooperative. Notes Patient's wound bed currently showed signs of good granulation at this time at most locations. There were also some areas where obviously there was some epithelization as well. Overall the patient seems to be managing well she is a little short of breath based on what I am seeing currently although she states this is getting better she does not have to be on oxygen. No sharp debridement was necessary today. Electronic Signature(s) Signed: 07/13/2019 11:00:00 AM By: [name] PA-C Entered By: [name] on 07/13/2019 10:59:59 -------------------------------------------------------------------------------- Physician Orders Details Patient Name: Date of Service: BRITLEE, SKOLNIK 07/13/2019 9:45 AM Medical Record [id number] Patient Account Number: [id number] Date of Birth/Sex: Treating RN: 01-03-1951 (68 y.o. [name] Primary Care Provider: [name] Other Clinician: Referring Provider: Treating Provider/Extender:Stone III, [name], FRED Weeks in Treatment: [phone] Verbal / Phone Orders: No Diagnosis Coding ICD-10 Coding Code Description E11.622 Type 2 diabetes mellitus with other skin ulcer I89.0 Lymphedema, not elsewhere classified I87.331 Chronic venous hypertension  (idiopathic) with ulcer and inflammation of right lower extremity I87.332 Chronic venous hypertension (idiopathic) with ulcer and inflammation of left lower extremity L97.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 leg with fat layer exposed L97.822 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 Dressing Change Frequency  Other: - all wounds - 2 times per week by home health Wound Cleansing Clean wound with Wound Cleanser - all wounds May shower with protection. Primary Wound Dressing Wound #61 Left,Circumferential Lower Leg Calcium Alginate with Silver Wound #63 Right,Medial Malleolus Calcium Alginate with Silver Wound #64 Left,Dorsal Foot Calcium Alginate with Silver Secondary Dressing Wound #61 Left,Circumferential Lower Leg Dry Gauze - all wounds ABD pad - as needed Wound #63 Right,Medial Malleolus Dry Gauze - all wounds ABD pad - as needed Wound #64 Left,Dorsal Foot Dry Gauze - all wounds ABD pad - as needed Edema Control Unna Boots Bilaterally Avoid standing for long periods of time - walking is encouraged Elevate legs to the level of the heart or above for 30 minutes daily and/or when sitting, a frequency of: - do not sleep in chair with feet dangling, MUST elevate legs while sitting Exercise regularly Support Garment 20-30 mm/Hg pressure to: - Juxtalite to right lower leg if unable to tolerate wrap Segmental Compressive Device. - lymphedema pumps 60 minutes 1- 2 times per day Additional Orders / Instructions Follow Nutritious Diet - To include vitamin A, vitamin C, and Zinc along with increased protein intake. [facility] for wound care. - [facility] Electronic Signature(s) Signed: 07/13/2019 6:10:09 PM By: [name] PA-C Signed: 07/13/2019 6:17:53 PM By: [name] RN, BSN Entered By: [name] on 07/13/2019 10:58:35 -------------------------------------------------------------------------------- Problem List Details Patient Name: Date of Service: [name]. 07/13/2019 9:45 AM Medical Record [id number] Patient Account Number: [id number] Date of Birth/Sex: Treating RN: 08/12/1950 (69 y.o. F) Primary Care Provider: [name] Other Clinician: Referring Provider: Treating Provider/Extender:Stone III, [name], FRED Weeks in Treatment: 101 Active Problems ICD-10 Evaluated Encounter Code Description Active Date Today Diagnosis E11.622 Type 2 diabetes mellitus with other skin ulcer 08/05/2017 No Yes I89.0 Lymphedema, not elsewhere classified 08/05/2017 No Yes I87.331 Chronic venous hypertension (idiopathic) with ulcer 08/05/2017 No Yes and inflammation of right lower extremity I87.332 Chronic venous hypertension (idiopathic) with ulcer 08/05/2017 No Yes and inflammation of left lower extremity L97.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 08/05/2017 No Yes leg with fat layer exposed L97.822 Non-pressure chronic ulcer of other part of left lower 08/05/2017 No Yes leg with fat layer exposed L97.522 Non-pressure chronic ulcer of other part of left foot 06/01/2019 No Yes with fat layer exposed I10 Essential (primary) hypertension 08/05/2017 No Yes E66.01 Morbid (severe) obesity due to excess calories 08/05/2017 No Yes F41.8 Other specified anxiety disorders 08/05/2017 No Yes R53.1 Weakness 08/05/2017 No Yes Inactive Problems Resolved Problems Electronic Signature(s) Signed: 07/13/2019 9:53:19 AM By: [name] PA-C Entered By: [name] on 07/13/2019  09:53:19 -------------------------------------------------------------------------------- Progress Note Details Patient Name: Date of Service: [name]. 07/13/2019 9:45 AM Medical Record [id number] Patient Account Number: [id number] Date of Birth/Sex: Treating RN: 1950/11/27 (68 y.o. F) Primary Care Provider: [name] Other Clinician: Referring Provider: Treating Provider/Extender:Stone III, [name], FRED Weeks in Treatment: 101 Subjective Chief Complaint Information obtained from Patient Bilateral reoccurring LE ulcers History of Present Illness (HPI) this patient has been seen a couple of times before and returns with recurrent problems to her right and left lower extremity with swelling and weeping ulcerations due to not wearing her compression stockings which she had been advised to do during her last discharge, at the end of June 2018. During her last visit the patient had had normal arterial blood flow and her venous reflux study did not necessitate  any surgical intervention. She was recommended compression and elevation and wound care. After prolonged treatment the patient was completely healed but she has been noncompliant with wearing or compressions.. She was here last week with an outpatient return visit planned but the patient came in a very poor general condition with altered mental status and was rushed to the ER on my request. With a history of hypertension, diabetes, TIA and right-sided weakness she was set up for an MRI on her brain and cervical spine and was sent to [facility]. Getting an MRI done was very difficult but once the workup was done she was found not to have any spinal stenosis, epidural abscess or hematoma or discitis. This was radiculopathy to be treated as an outpatient and she was given a follow-up appointment. Today she is feeling much better alert and oriented and has come to reevaluate her bilateral lower extremity lymphedema and  ulceration 03/25/2017 -- she was admitted to the hospital on 03/16/2017 and discharged on 03/18/2017 with left leg cellulitis and ulceration. She was started on vancomycin and Zosyn and x-ray showed no bony involvement. She was treated for a cellulitis with IV antibiotics changed to Rocephin and Flagyl and was discharged on oral Keflex and doxycycline to complete a 7 day course. Last hemoglobin A1c was 7.1 and her other ailments including hypertension got asthma were appropriately treated. 05/06/2017 -- she is awaiting the right size of compression stockings from [place] but other than that has been doing well. ====== Old notes 69 year old patient was seen one time last October and was lost to follow-up. She has recurrent problems with weeping and ulceration of her left lower extremity and has swelling of this for several years. It has been worse for the last 2 months. Past medical history is significant for diabetes mellitus type 2, hypertension, gout, morbid obesity, depressive disorders, hiatal hernia, migraines, status post knee surgery, risk of a cholecystectomy, vaginal hysterectomy and breast biopsy. She is not a smoker. As noted before she has never had a venous duplex study and an arterial ABI study was attempted but the left lower extremity was noncompressible 10/01/2016 -- had a lower extremity venous duplex reflux evaluation which showed no evidence of deep vein reflux in the right or left lower extremity, and no evidence of great saphenous vein reflux more than 500 ms in the right or left lower extremity, and the left small saphenous vein is incompetent but no vascular consult was recommended. review of her electronic medical records noted that the ABI was checked in July 2017 where the right ABI was normal limits and the left ABI could not be ascertained due to pain with cuff pressure but the waveforms are within normal limits. her arterial duplex study scheduled for April  27. 10/08/2016 -- the patient has various reasons for not having a compression on and for the last 3 days she has had no compression on her left lower extremity either due to pain or the lack of nursing help. She does not use her juxta lites either. 10/15/2016 -- the patient did not keep her appointment for arterial duplex study on April 27 and I have asked her to reschedule this. Her pain is out of proportion with the physical findings and she continuously fails to wear a compression wraps and cuts them off because she says she cannot tolerate the pain. She does not use her juxta lites either. 10/22/2016 -- he has rescheduled her arterial duplex study to May 21 and her pain today  is a bit better. She has not been wearing her juxta lites on her right lower extremity but now understands that she needs to do this. She did tolerate the to press compression wrap on her left lower extremity 10/29/2016 --arterial duplex study is scheduled for next week and overall she has been tolerating her compression wraps and also using her juxta lites on her right lower extremity 11/05/2016 -- the right ABI was 0.95 the left was 1.03. The digit TBI is on the right was 0.83 on the left was 0.92 and she had biphasic flow through these vessels. The impression was that of normal lower extremity arterial study. 11/12/2016 -- her pain is minimal and she is doing very well overall. 11/26/2016 -- she has got juxta lites and her insurance will not pay for additional dual layer compression stockings. She is going to order some from [employer]. 05/12/2017 -- her juxta lites are very old and too big for her and these have not been helping with compression. She did get 20-30 mm compression stockings from [place] but she and her husband are unable to put these on. I believe she will benefit from bilateral Extremit-ease, compression stockings and we will measure her for these today. 05/20/2017 -- lymphedema on the left lower  extremity has increased a lot and she has a open ulceration as a result of this. The right lower extremity is looking pretty good. She has decided to by the compression stockings herself and will get reimbursed by the home health, at a later date. 05/27/2017 -- her sciatica is bothering her a lot and she thought her left leg pain was caused due to the compression wrap and hence removed it and has significant lymphedema. There is no inflammation on this left lower extremity. 06/17/17 on evaluation today patient appears to be doing very well and in fact is completely healed in regard to her ulcerations. Unfortunately however she does have continued issues with lymphedema nonetheless. We did order compression garments for her unfortunately she states that the size that she received were large although we ordered medium. Obviously this means she is not getting the optimal compression. She does not have those with her today and therefore we could not confirm and contact the company on her behalf. Nonetheless she does state that she is going to have her husband bring them by tomorrow so that we can verify and then get in touch with the company. No fevers, chills, nausea, or vomiting noted at this time. Overall patient is doing better otherwise and I'm pleased with the progress she has made. 07/01/17 on evaluation today patient appears to be doing very well in regard to her bilateral lower extremity she does not have any openings at this point which is excellent news. Overall I'm pleased with how things have progressed up to this time. Since she is doing so well we did order her compression which we are seeing her today to ensure that it fits her properly and everything is doing well in that regard and then subsequently she will be discharged. ============ Old Notes: 03/31/16 patient presents today for evaluation concerning open wounds that she has over the left medial ankle region as well as the left  dorsal foot. She has previously had this occur although it has been healed for a number of months after having this for about a year prior until her hospitalization on 01/05/16. At that point in time it appears that she was admitted to the hospital for left lower extremity  cellulitis and was placed on vancomycin and Zosyn at that point. Eventually upon discharge on January 15, 2016 she was placed on doxycycline at that point in time. Later on 03/27/16 positive wound culture growing Escherichia coli this was switched to amoxicillin. Currently she tells me that she is having pain radiated to be a 7 out of 10 which can be as high as 10 out of 10 with palpation and manipulation of the wound. This wound appears to be mainly venous in nature due to the bilateral lower extremity venous stasis/lymphedema. This is definitely much worse on her left than the right side. She does have type 1 diabetes mellitus, hypertension, morbid obesity, and is wheelchair dependent.during the course of the hospital stay a blood culture was also obtained and fortunately appeared negative. She also had an x-ray of the tibia/fibula on the left which showed no acute bone abnormality. Her white blood cell count which was performed last on 03/25/16 was 7.3, hemoglobin 12.8, protein 7.1, albumin 3.0. Her urine culture appeared to be negative for any specific organisms. Patient did have a left lower extremity venous duplex evaluation for DVT. This did not include venous reflux studies but fortunately was negative for DVT. Patient also had arterial studies performed which revealed that she had a normal ABI on the right though this was unable to be performed on the left secondary to pain that she was having around the ankle region due to the wound. However it was stated on report that she had biphasic pulses and apparently good blood flow. ========== 06/03/17 she is here in follow-up evaluation for right lower extremity ulcer. The right  lower sure he has healed but she has reopened to the left medial malleolus and dorsal foot with weeping. She is waiting for new compression garments to arrive from home health, the previous compression garments were ill fitting. We will continue with compression bilaterally and follow-up in 2 weeks Readmission: 08/05/17 on evaluation today patient appears to be doing somewhat poorly in regard to her left lower extremity especially although the right lower extremity has a small area which may no longer be open. She has been having a lot of drainage from the left lower extremity however he tells me that she has not been able to use the EXTREMIT- EASE Compression at this point. She states that she did better and was able to actually apply the Juxta-Lite compression although the wound that she has is too large and therefore really does not compress which is why she cannot wear it at this point. She has no one who can help her put it on regular basis her son can sometimes but he's not able to do it most of the time. I do believe that's why she has begun to weave and have issues as she is currently yet again. No fevers, chills, nausea, or vomiting noted at this time. Patient is no evidence of dementia. 08/12/17 on evaluation today patient appears to still be doing fairly well in regard to the draining areas/weeping areas at this point. With that being said she unfortunately did go to the ER yesterday due to what was felt to be possibly a cellulitis. They place her on doxycycline by mouth and discharge her home. She definitely was not admitted. With that being said she states she has had more discomfort which has been unusual for her even compared to prior times and she's had infections.08/12/17 on evaluation today patient appears to still be doing fairly well in regard to  the draining areas/weeping areas at this point. With that being said she unfortunately did go to the ER yesterday due to what was felt  to be possibly a cellulitis. They place her on doxycycline by mouth and discharge her home. She definitely was not admitted. With that being said she states she has had more discomfort which has been unusual for her even compared to prior times and she's had infections. 08/19/17 put evaluation today patient tells me that she's been having a lot of what sounds to be neuropathic type pain in regard to her left lower extremity. She has been using over-the-counter topical bins again which some believe. That in order to apply the she actually remove the wrap we put on her last Wednesday on Thursday. Subsequently she has not had anything on compression wise since that time. The good news is a lot of the weeping areas appear to have closed at this point again I believe she would do better with compression but we are struggling to get her to actually use what she needs to at this point. No fevers, chills, nausea, or vomiting noted at this time. 09/03/17 on evaluation today patient appears to be doing okay in regard to her lower extremities in regard to the lymphedema and weeping. Fortunately she does not seem to show any signs of infection at this point she does have a little bit of weeping occurring in the right medial malleolus area. With that being said this does not appear to be too significant which is good news. 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 She has severe skin damage secondary to both of these features involving the dorsal left foot and medial left ankle and lower leg. Still has open areas in the left anterior foot. The area on the right closed over. She uses her own juxta light stockings. She does not have an arterial issue 09/16/17 on evaluation today patient actually appears to be doing excellent in regard to her bilateral lower extremity swelling. The Juxta-Lite compression wrap seem to be doing very well for her. She  has not however been using the portion that goes over her foot. Her left foot still is draining a little bit not nearly as significant as it has been in the past but still I do believe that she likely needs to utilize the full wrap including the foot portion of this will improve as well. She also has been apparently putting on a significant amount of Vaseline which also think is not helpful for her. I recommended that if she feels she needs something for moisturizer Eucerin will probably be better. 09/30/17 on evaluation today patient presents with several new open areas in regard to her left lower extremity although these appear to be minimal and mainly seem to be more moisture breakdown than anything. Fortunately she does not seem to have any evidence of infection which is great news. She has been tolerating the dressing changes without complication we are using silver alginate on the foot she has been using AB pads to have the legs and using her Juxta-Lite compression which seems to be controlling her swelling very well. Overall I'm pleased with the poor way she has progressed. 10/14/17 on evaluation today patient appears to be doing better in regard to her left lower extremity areas of weeping. She does still have some discomfort although in general this does not appear to be as macerated and I think it is progressing nicely.  I do think she still needs to wear the foot portion of her Juxta-Lite in order to get the most benefit from the wrap obviously. She states she understands. Fortunately there does not appear to be evidence of infection at this time which is great news. 10/28/17 on evaluation today patient appears to be doing excellent in regard to her left lower extremity. She has just a couple areas that are still open and seem to be causing any trouble whatsoever. For that reason I think that she is definitely headed in the right direction the spots are very tiny compared to what we have been  dealing with in the past. 11/11/17 on evaluation today patient appears to have a right lateral lower extremity ulcer that has opened since I last saw her. She states this is where the home health nurse that was coming out remove the dressing without wetting the alginate first. Nonetheless I do not know if this is indeed the case or not but more importantly we have not ordered home help to be coming out for her wounds at all. I'm unsure as to why they are coming out and we're gonna have to check on this and get things situated in that regard. With that being said we currently really do not need them to be coming out as the patient has been taking care of her leg herself without complication and no issues. In fact she was doing much better prior to nursing coming out. 11/25/17 on evaluation today patient actually appears to be doing fairly well in regard to her left lower extremity swelling. In fact she has very little area of weeping at this point there's just a small spot on the lateral portion of her right leg that still has me just a little bit more concerned as far as wanting to see this clear up before I discharge her to caring for this at home. Nonetheless overall she has made excellent progress. 12/09/17 on evaluation today patient appears to be doing rather well in regard to her lower extremity edema. She does have some weeping still in the left lower extremity although the big area we were taking care of two weeks ago actually has closed and she has another area of weeping on the left lower extremity immediately as well is the top of her foot. She does not currently have lymphedema pumps she has been wearing her compression daily on a regular basis as directed. With that being said I think she may benefit from lymphedema pumps. She has been wearing the compression on a regular basis since I've been seeing her back in February 2019 through now and despite this she still continues to have  issues with stage III lymphedema. We had a very difficult time getting and keeping this under control. 12/23/17 on evaluation today patient actually appears to be doing a little bit more poorly in regard to her bilateral lower extremities. She has been tolerating the Juxta-Lite compression wraps. Unfortunately she has two new ulcers on the right lower extremity and left lower Trinity ulceration seems to be larger. Obviously this is not good news. She has been tolerating the dressings without complication. 12/30/17 on evaluation today patient actually appears to be doing much better in regard to her bilateral lower extremity edema. She continues to have some issues with ulcerations and in fact there appears to be one spot on each leg where the wrap may have caused a little bit of a blister which is subsequently opened up at  this point is given her pain. Fortunately it does not appear to be any evidence of infection which is good news. No fevers chills noted. 01/13/18 on evaluation today patient appears to be doing rather well in regard to her bilateral lower extremities. The dressings did get kind of stuck as far as the wound beds are concerned but again I think this is mainly due to the fact that she actually seems to be showing signs of healing which is good news. She's not having as much drainage therefore she was having more of the dressing sticking. Nonetheless overall I feel like her swelling is dramatically down compared to previous. 01/20/18 on evaluation today patient unfortunately though she's doing better in most regards has a large blister on the left anterior lower extremity where she is draining quite significantly. Subsequently this is going to need debridement today in order to see what's underneath and ensure she does not continue to trapping fluid at this location. Nonetheless No fevers, chills, nausea, or vomiting noted at this time. 01/27/18 on evaluation today patient appears to be  doing rather well at this point in regard to her right lower extremity there's just a very small area that she still has open at this point. With that being said I do believe that she is tolerating the compression wraps very well in making good progress. Home health is coming out at this point to see her. Her left lower extremity on the lateral portion is actually what still mainly open and causing her some discomfort for the most part 02/10/18 on evaluation today patient actually appears to be doing very well in regard to her right lower extremity were all the ulcers appear to be completely close. In regard to the left lower extremity she does have two areas still open and some leaking from the dorsal surface of her foot but this still seems to be doing much better to me in general. 02/24/18 on evaluation today patient actually appears to be doing much better in regard to her right lower extremity this is still completely healed. Her left lower extremity is also doing much better fortunately she has no evidence of infection. The one area that is gonna require some debridement is still on the left anterior shin. Fortunately this is not hurting her as badly today. 03/10/18 on evaluation today patient appears to be doing better in some regards although she has a little bit more open area on the dorsal foot and she also has some issues on the medial portion of the left lower extremity which is actually new and somewhat deep. With that being said there fortunately does not appear to be any significant signs of infection which is good news. No fevers, chills, nausea, or vomiting noted at this time. In general her swelling seems to be doing fairly well which is good news. 03/31/18 on evaluation today patient presents for follow-up concerning her left lower extremity lymphedema. Unfortunately she has been doing a little bit more poorly since I last saw her in regard to the amount of weeping that she is  experiencing. She's also having some increased pain in the anterior shin location. Unfortunately I do not feel like the patient is making such good progress at this point a few weeks back she was definitely doing much better. 04/07/18 on evaluation today patient actually appears to be showing some signs of improvement as far as the left lower extremity is concerned. She has been tolerating the dressing changes and it does  appear that the Drawtex did better for her. With that being said unfortunately home health is stating that they cannot obtain the Drawtex going forward. Nonetheless we're gonna have to check and see what they may be able to get the alginate they were using was getting stuck in causing new areas of skin being pulled all that with and subsequently weep and calls her to worsen overall this is the first time we've seen improvement at this time. 04/14/18 on evaluation today patient actually appears to be doing rather well at this point there does not appear to be any evidence of infection at this time and she is actually doing excellent in regard to the weeping in fact she almost has no openings remaining even compared to just last week this is a dramatic improvement. No fevers chills noted 04/21/18 evaluation today patient actually appears to be doing very well. She in fact is has a small area on the posterior lower extremity location and she has a small area on the dorsal surface of her foot that are still open both of which are very close to closing. We're hoping this will be close shortly. She brought her Juxta-Lite wrap with her today hoping that would be able to put her in it unfortunately I don't think were quite at that point yet but we're getting closer. 04/28/18 upon evaluation today patient actually appears to be doing excellent in regard to her left lower extremity ulcer. In fact the region on the posterior lower extremity actually is much smaller than previously noted.  Overall I'm very happy with the progress she has made. She again did bring her Juxta-Lite although we're not quite ready for that yet. 05/11/18 upon evaluation today patient actually appears to be doing in general fairly well in regard to her left lower Trinity. The swelling is very well controlled. With that being said she has a new area on the left anterior lower extremity as well as between the first and second toes of her left foot that was not present during the last evaluation. The region of her posterior left lower extremity actually appears to be almost completely healed. To be honest I'm very pleased with the way that stands. Nonetheless I do believe that the lotion may be keeping the area to moist as far as her legs are concerned subsequently I'm gonna consider discontinuing that today. 05/26/18 on evaluation today patient appears to be doing rather well in regard to her left lower should be ulcers. In fact everything appears to be close except for a very small area on the left posterior lower extremity. Fortunately there does not appear to be any evidence of infection at this time. Overall very pleased with her progress. 06/02/18 and evaluation today patient actually appears to be doing very well in regard to her lower extremity ulcers. She has one small area that still continues to weep that I think may benefit her being able to justify lotion and user Juxta-Lite wraps versus continued to wrap her. Nonetheless I think this is something we can definitely look into at this point. 06/23/18 on evaluation today patient unfortunately has openings of her bilateral lower extremities. In general she seems to be doing much worse than when I last saw her just as far as her overall health standpoint is concerned. She states that her discomfort is mainly due to neuropathy she's not having any other issues otherwise. No fevers, chills, nausea, or vomiting noted at this time. 06/30/18 on evaluation  today patient  actually appears to be doing a little worse in regard to her right lower extremity her left lower extremity of doing fairly well. Fortunately there is no sign of infection at this time. She has been tolerating the dressing changes without complication. Home health did not come out like they were supposed to for the appropriate wrap changes. They stated that they never received the orders from [place] which were fax. Nonetheless we will send a copy of the orders with the patient today as well. 07/07/18 on evaluation today patient appears to be doing much better in regard to lower extremities. She still has several openings bilaterally although since I last saw her her legs did show obvious signs of infection when she later saw her nurse. Subsequently a culture was obtained and she is been placed on Bactrim and Keflex. Fortunately things seem to be looking much better it does appear she likely had an infection. Again last week we'd even discussed it but again there really was not any obvious sign that she had infection therefore we held off on the antibiotics. Nonetheless I'm glad she's doing better today. 07/14/18 on evaluation today patient appears to be doing much better regarding her bilateral lower Trinity's. In fact on the right lower for me there's nothing open at this point there are some dry skin areas at the sites where she had infection. Fortunately there is no evidence of systemic infection which is excellent news. No fevers chills noted 07/21/18 on evaluation today patient actually appears to be doing much better in regard to her left lower extremity ulcers. She is making good progress and overall I feel like she's improving each time I see her. She's having no pain I do feel like the infection is completely resolved which is excellent news. No fevers, chills, nausea, or vomiting noted at this time. 07/28/18 on evaluation today patient appears to be doing very well in regard to her  left lower [employer]. Everything seems to be showing signs of improvement which is excellent news. Overall very pleased with the progress that has been made. Fortunately there's no evidence of active infection at this time also excellent news. 08/04/18 on evaluation today patient appears to be doing more poorly in regard to her bilateral lower extremities. She has two new areas open up on the right and these were completely closed as of last week. She still has the two spots on the left which in my pinion seem to be doing better. Fortunately there's no evidence of infection again at this point. 08/11/18 on evaluation today patient actually appears to be doing very well in regard to her bilateral lower Trinity wounds that all seem to be doing better and are measures smaller today. Fortunately there's no signs of infection. No fevers, chills, nausea, or vomiting noted at this time. 08/18/18 on evaluation today patient actually appears to be doing about the same inverter bilateral lower extremities. She continues to have areas that blistering open as was drain that fortunately nothing too significant. Overall I feel like Drawtex may have done better for her however compared to the collagen. 08/25/18 on evaluation today patient appears to be doing a little bit more poorly today even compared to last time I saw her. Again I'm not exactly sure why she's making worse progress over the past several weeks. I'm beginning to wonder if there is some kind of underlying low level infection causing this issue. I did actually take a culture from the left anterior lower extremity but  it was a new wound draining quite a bit at this point. Unfortunately she also seems to be having more pain which is what also makes me worried about the possibility of infection. This is despite never erythema noted at this point. 09/01/18 on evaluation today patient actually appears to be doing a little worse even compared to last  week in regard to bilateral lower extremities. She did go to the hospital on the 16th was given a dose of IV Zosyn and then discharged with a recommendation to continue with the Bactrim that I previously prescribed for her. Nonetheless she is still having a lot of discomfort she tells me as well at this time. This is definitely unfortunate. No fevers, chills, nausea, or vomiting noted at this time. 09/08/18 on evaluation today patient's bilateral lower extremities actually appear to be shown signs of improvement which is good news. Fortunately there does not appear to be any signs of active infection I think the anabiotic is helping in this regard. Overall I'm very pleased with how she is progressing. 09/15/18 patient was actually seen in ER yesterday due to her legs as well unfortunately. She states she's been having a lot of pain and discomfort as well as a lot of drainage. Upon inspection today the patient does have a lot of swelling and drainage I feel like this is more related to lymphedema and poor fluid control than it is to infection based on what I'm seeing. The physician in the emergency department also doubted that the patient was having a significant infection nonetheless I see no evidence of infection obvious at this point although I do see evidence of poor fluid control. She still not using a compression pumps, she is not elevating due to her lift chair as well as her hospital bed being broken, and she really is not keeping her legs up as much as they should be and also has been taking off her wraps. All this combined I think has led to poor fluid control and to be honest she may be somewhat volume overloaded in general as well. I recommend that she may need to contact your physician to see if a prescription for a diuretic would be beneficial in their opinion. As long as this is safe I think it would likely help her. 09/29/18 on evaluation today patient's left lower extremity actually  appears to be doing quite a bit better. At least compared to last time that I saw her. She still has a large area where she is draining from but there's a lot of new skin speckled trout and in fact there's more new skin that there are open areas of weeping and drainage at this point. This is good news. With regard to the right lower extremity this is doing much better with the only open area that I really see being a dry spot on the right lateral ankle currently. Fortunately there's no signs of active infection at this time which is good news. No fevers, chills, nausea, or vomiting noted at this time. The patient seems somewhat stressed and overwhelmed during the visit today she was very lethargic as such. She does and she is not taking any pain medications at this point. Apparently according to her husband are also in the process of moving which is probably taking its toll on her as well. 10/06/18 on evaluation today patient appears to be doing rather well in regard to her lower extremities compared to last evaluation. Fortunately there's no signs of active  infection. She tells me she did have an appointment with her primary. Nonetheless he was concerned that the wounds were somewhat deep based on pictures but we never actually saw her legs. She states that he had her somewhat worried due to the fact that she was fearing now that she was [place] have to have an amputation. With that being said based on what I'm seeing check she looks better this week that she has the last two times I've seen her with much less drainage I'm actually pleased in this regard. That doesn't mean that she's out of the water but again I do not think what the point of talking about education at all in regard to her leg. She is very happy to hear this. She is also not having as much pain as she was having last week. 10/13/18 unfortunately on evaluation today patient still continues to have a significant amount of drainage she's  not letting home health actually apply the compression dressings at this point. She's trying to use of Juxta-Lite of the top of Kerlex and the second layer of the three layer compression wrap. With that being said she just does not seem to be making as good a progress as I would expect if she was having the compression applied and in place on a regular basis. No fevers, chills, nausea, or vomiting noted at this time. 10/20/18 on evaluation today patient appears to be doing a little better in regard to her bilateral lower extremity ulcers. In fact the right lower extremity seems to be healed she doesn't even have any openings at this point left lower extremity though still somewhat macerated seems to be showing signs of new skin growth at multiple locations throughout. Fortunately there's no evidence of active infection at this time. No fevers, chills, nausea, or vomiting noted at this time. 10/27/18 on evaluation today patient appears to be doing much better in regard to her left lower Trinity ulcer. She's been tolerating the laptop complication and has minimal drainage noted at this point. Fortunately there's no signs of active infection at this time. No fevers, chills, nausea, or vomiting noted at this time. 11/03/18 on evaluation today patient actually appears to be doing excellent in regard to her left lower extremity. She is having very little drainage at this point there does not appear to be any significant signs of infection overall very pleased with how things have gone. She is likewise extremely pleased still and seems to be making wonderful progress week to week. I do believe antibiotics were helpful for her. Her primary care provider did place on amateur clean since I last saw her. 11/17/18 on evaluation today patient appears to be doing worse in regard to her bilateral lower extremities at this point. She is been tolerating the dressing changes without complication. With that being said she  typically takes the Coban off fairly quickly upon arriving home even after being seen here in the clinic and does not allow home health reapply command as part of the dressing at home. Therefore she said no compression essentially since I last saw her as best I can tell. With that being said I think it shows and how much swelling she has in the open wounds that are noted at this point. Fortunately there's no signs of infection but unfortunately if she doesn't get this under control I think she will end up with infection and more significant issues. 11/24/18 on evaluation today patient actually appears to be doing somewhat better  in regard to her bilateral lower extremities. 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 Nonetheless that is doing better currently so she should be able to attempt reinitiating the lymphedema pumps at this time. No fevers, chills, nausea, or vomiting noted at this time. 12/01/18 upon evaluation today patient's left lower extremity appears to be doing quite well unfortunately her right lower extremity is not doing nearly as well. She has been tolerating the dressing changes without complication unfortunately she did not keep a wrap on the right at this time. Nonetheless I believe this has led to increased swelling and weeping in the world is actually much larger than during the last evaluation with her. 12/08/18 on evaluation today patient appears to be doing about the same at this point in regard to her right lower extremity. There is some more palatable to touch I'm concerned about the possibility of there being some infection although I think the main issue is she's not keeping her compression wrap on which in turn is not allowing this area to heal appropriately. 12/22/18 on evaluation today patient appears to be doing better in  regard to left lower extremity unfortunately significantly worse in regard to the right lower extremity. The areas of blistering and necrotic superficial tissue have spread and again this does not really appear to be signs of infection and all she just doesn't seem to be doing nearly as well is what she has been in the past. Overall I feel like the Augmentin did absolutely nothing for her she doesn't seem to have any infection again I really didn't think so last time either is more of a potential preventative measure and hoping that this would make some difference but I think the main issue is she's not wearing her compression. She tells me she cannot wear the [place] been we put on she takes it off pretty much upon getting home. Subsequently she worshiped Juxta-Lite when I questioned her about how often she wears it this is no more than three hours a day obviously that leaves 21 hours that she has no compression and this is obviously not doing well for her. Overall I'm concerned that if things continue to worsen she is at great risk of both infection as well as losing her leg. 01/05/19 on evaluation today patient appears to be doing well in regard to her left lower extremity which he is allowing [place] to wrap and not so well with regard to her right lower extremity which she is not allowing [place] to really wrap and keep the wrap on. She states that it hurts too badly whenever it's wrapped and she ends up having to take it off. She's been using the Juxta-Lite she tells me up to six hours a day although I question whether or not that's really been the case to be honest. Previously she told me three hours today nonetheless obviously the legs as long as the wrap is doing great when she is not is doing much more poorly. 01/12/2019 on evaluation today patient actually appears to be doing a little better in my opinion with regard to her right lower extremity ulcer. She has a small open area on the left lower  extremity unfortunately but again this I think is part of the normal fluctuation of what she is going to have  to expect with regard to her legs especially when she is not using her lymphedema pumps on a regular basis. Subsequently based on what I am seeing today I think that she does seem to be doing slightly better with regard to her right lower extremity she did see her primary care provider on Monday they felt she had an infection and placed her on 2 antibiotics. Both Cipro and clindamycin. Subsequently again she seems possibly to be doing a little bit better in regards to the right lower extremity she also tells me however she has been wearing the compression wrap over the past week since I spoke with her as well that is a Kerlix and Coban wrap on the right. No fevers, chills, nausea, vomiting, or diarrhea. 01/19/2019 on evaluation today patient appears to be doing better with regard to her bilateral lower extremities especially the right. I feel like the compression has been beneficial for her which is great news. She did get a call from her primary care provider on her way here today telling her that she did have methicillin-resistant Staphylococcus aureus and he was calling in a couple new antibiotics for her including a ointment to be applied she tells me 3 times a day. With that being said this sounds like likely to be Bactroban which I think could be applied with each dressing/wrap change but I would not be able to accommodate her applying this 3 times a day. She is in agreement with the least doing this we will add that to her orders today. 01/26/2019 on evaluation today patient actually appears to be doing much better with regard to her right lower extremity. Her left lower extremity is also doing quite well all things considering. Fortunately there is no evidence of active infection at this time. No fevers, chills, nausea, vomiting, or diarrhea. 02/02/2019 on evaluation today patient  appears to be doing much better compared to her last evaluation. Little by little off like her right leg is returning more towards normal. There does not appear to be any signs of active infection and overall she seems to be doing quite well which is great news. I am very pleased in this regard. No fevers, chills, nausea, vomiting, or diarrhea. 02/09/2019 upon evaluation today patient appears to be doing better with regard to her bilateral lower extremities. She has been tolerating the dressing changes without complication. Fortunately there is no signs of active infection at this time. No fevers, chills, nausea, vomiting, or diarrhea. 02/23/2019 on evaluation today patient actually appears to be doing quite well with regard to her bilateral lower extremities. She has been tolerating the dressing changes without complication. She is even used her pumps one time and states that she really felt like it felt good. With that being said she seems to be in good spirits and her legs appear to be doing excellent. 03/09/2019 on evaluation today patient appears to be doing well with regard to her right lower extremity there are no open wounds at this time she is having some discomfort but I feel like this is more neuropathy than anything. With regard to her left lower extremity she had several areas scattered around that she does have some weeping and drainage from but again overall she does not appear to be having any significant issues and no evidence of infection at this time which is good news. 03/23/2019 on evaluation today patient appears to be doing well with regard to her right lower extremity which she tells me is  still close she is using her juxta light here. Her left lower extremity she mainly just has an area on the foot which is still slightly draining although this also is doing great. Overall very pleased at this time. 04/06/2019 patient appears to be doing a little bit worse in regard to her left  lower extremity upon evaluation today. She feels like this could be becoming infected again which she had issues with previous. Fortunately there is no signs of systemic infection but again this is always a struggle with her with her legs she will go from doing well to not so well in a very short amount of time. 04/20/2019 on evaluation today patient actually appears to be doing quite well with regard to her right lower extremity I do not see any signs of active infection at this time. Fortunately there is no fever chills noted. She is still taking the antibiotics which I prescribed for her at this point. In regard to the left lower extremity I do feel like some of these areas are better although again she still is having weeping from several locations at this time. 04/27/2019 on evaluation today patient appears to be doing about the same if not slightly worse in regard to her left lower extremity ulcers. She tells me when questioned that she has been sleeping in her Hoveround chair in fact she tells me she falls asleep without even knowing it. I think she is spending a whole lot of time in the chair and less time walking and moving around which is not good for her legs either. On top of that she is in a seated position which is also the worst position she is not really elevating her legs and she is also not using her lymphedema pumps. All this is good to contribute to worsening of her condition in general. 05/18/2019 on evaluation today patient appears to be doing well with regard to her lower extremity on the right in fact this is showing no signs of any open wounds at this time. On the left she is continuing to have issues with areas that do drain. Some of the regions have healed and there are couple areas that have reopened. She did go to the ER per the patient according to recommendations from the home health nurse due to what she was seen when she came out on 05/13/2019. Subsequently she felt  like the patient needed to go to the hospital due to the fact that again she was having "milky white discharge" from her leg. Nonetheless she had and then was placed on doxycycline and subsequently seems to be doing better. 06/01/2019 upon evaluation today patient appears to be doing really in my opinion about the same. I do not see any signs of active infection which is good news. Overall she still has wounds over the bilateral lower extremities she has reopened on the right but this appears to be more of a crack where there is weeping/edema coming from the region. I do not see any evidence of infection at either site based on what I visualized today. 07/13/2019 upon evaluation today patient appears to be doing a little worse compared to last time I saw her. She since has been in the hospital from 06/21/2019 through 06/29/2019. This was secondary to having Covid. During that time they did apply lotion to her legs which unfortunately has caused her to develop a myriad of open wounds on her lower extremities. Her legs do appear to be doing better  as far as the overall appearance is concerned but nonetheless she does have more open and weeping areas. Objective Constitutional Well-nourished and well-hydrated in no acute distress. Vitals Time Taken: 10:04 AM, Height: 62 in, Weight: 335 lbs, BMI: 61.3, Temperature: 98.2 F, Pulse: 79 bpm, Respiratory Rate: 20 breaths/min, Blood Pressure: 128/90 mmHg. Psychiatric this patient is able to make decisions and demonstrates good insight into disease process. Alert and Oriented x 3. pleasant and cooperative. General Notes: Patient's wound bed currently showed signs of good granulation at this time at most locations. There were also some areas where obviously there was some epithelization as well. Overall the patient seems to be managing well she is a little short of breath based on what I am seeing currently although she states this is getting better she  does not have to be on oxygen. No sharp debridement was necessary today. Integumentary (Hair, Skin) Wound #58 status is Converted. Original cause of wound was Blister. The wound is located on the Left,Medial Ankle. The wound measures 0cm length x 0cm width x 0cm depth; 0cm^2 area and 0cm^3 volume. There is no tunneling or undermining noted. There is a none present amount of drainage noted. The wound margin is distinct with the outline attached to the wound base. There is no granulation within the wound bed. There is no necrotic tissue within the wound bed. Wound #61 status is Open. Original cause of wound was Gradually Appeared. The wound is located on the Left,Circumferential Lower Leg. The wound measures 12cm length x 29cm width x 0.2cm depth; 273.319cm^2 area and 54.664cm^3 volume. There is Fat Layer (Subcutaneous Tissue) Exposed exposed. There is no tunneling or undermining noted. There is a medium amount of serosanguineous drainage noted. The wound margin is flat and intact. There is medium (34-66%) pink granulation within the wound bed. There is a medium (34-66%) amount of necrotic tissue within the wound bed including Adherent Slough. Wound #62 status is Converted. Original cause of wound was Gradually Appeared. The wound is located on the Left,Proximal,Medial Lower Leg. The wound measures 0cm length x 0cm width x 0cm depth; 0cm^2 area and 0cm^3 volume. There is no undermining noted. There is a none present amount of drainage noted. The wound margin is distinct with the outline attached to the wound base. There is no granulation within the wound bed. There is no necrotic tissue within the wound bed. Wound #63 status is Open. Original cause of wound was Gradually Appeared. The wound is located on the Right,Medial Malleolus. The wound measures 8cm length x 4cm width x 0.2cm depth; 25.133cm^2 area and 5.027cm^3 volume. There is Fat Layer (Subcutaneous Tissue) Exposed exposed. There is no  tunneling or undermining noted. There is a medium amount of serosanguineous drainage noted. The wound margin is well defined and not attached to the wound base. There is small (1-33%) pale granulation within the wound bed. There is a large (67-100%) amount of necrotic tissue within the wound bed including Eschar and Adherent Slough. Wound #64 status is Open. Original cause of wound was Gradually Appeared. The wound is located on the Left,Dorsal Foot. The wound measures 10cm length x 8cm width x 0.1cm depth; 62.832cm^2 area and 6.283cm^3 volume. There is Fat Layer (Subcutaneous Tissue) Exposed exposed. There is no tunneling or undermining noted. There is a medium amount of serosanguineous drainage noted. The wound margin is flat and intact. There is medium (34-66%) pink granulation within the wound bed. There is a medium (34-66%) amount of necrotic tissue within the  wound bed including Adherent Slough. 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 There was a Haematologist Compression Therapy Procedure by Carlene Coria, RN. Post procedure Diagnosis Wound #61: Same as Pre-Procedure Wound #63 Pre-procedure diagnosis of Wound #63 is a Venous Leg Ulcer  located on the Right,Medial Malleolus . There was a Haematologist Compression Therapy Procedure by Carlene Coria, RN. Post procedure Diagnosis Wound #63: Same as Pre-Procedure Wound #64 Pre-procedure diagnosis of Wound #64 is a Diabetic Wound/Ulcer of the Lower Extremity located on the Left,Dorsal Foot . There was a Haematologist Compression Therapy Procedure by Carlene Coria, RN. Post procedure Diagnosis Wound #64: Same as Pre-Procedure Plan Follow-up Appointments: Return Appointment in 2 weeks. Dressing Change Frequency: Other: - all wounds - 2 times per week by home health Wound Cleansing: Clean wound with Wound Cleanser - all wounds May shower with protection. Primary Wound Dressing: Wound #61 Left,Circumferential Lower Leg: Calcium Alginate with Silver Wound #63 Right,Medial Malleolus: Calcium Alginate with Silver Wound #64 Left,Dorsal Foot: Calcium Alginate with Silver Secondary Dressing: Wound #61 Left,Circumferential Lower Leg: Dry Gauze - all wounds ABD pad - as needed Wound #63 Right,Medial Malleolus: Dry Gauze - all wounds ABD pad - as needed Wound #64 Left,Dorsal Foot: Dry Gauze - all wounds ABD pad - as needed Edema Control: Unna Boots Bilaterally Avoid standing for long periods of time - walking is encouraged Elevate legs to the level of the heart or above for 30 minutes daily and/or when sitting, a frequency of: - do not sleep in chair with feet dangling, MUST elevate legs while sitting Exercise regularly Support Garment 20-30 mm/Hg pressure to: - Juxtalite to right lower leg if unable to tolerate wrap Segmental Compressive Device. - lymphedema pumps 60 minutes 1- 2 times per day Additional Orders / Instructions: Follow Nutritious Diet - To include vitamin A, vitamin C, and Zinc along with increased protein intake. Home Health: [facility] for [facility]. - [facility]. My suggestion at this time is can be that we going to continue with the  current wound care measures specifically with regard to compression wrapping although I am going to recommend we switch to a Unna boot wrap to see if this will be helpful for her at this point. We will also use silver alginate dressings for the open areas and ABD pad to cover. My hope is that the Unna boot wrap in general will help improve the quality of her skin. 2. I would recommend as well she needs to elevate her legs as much as possible in order to help with edema control. 3. She also needs to be using her lymphedema pumps on a regular basis which is 1-2 times a day at 60 minutes per time. We will see patient back for reevaluation in 1 week here in the clinic. If anything worsens or changes patient  will contact our office for additional recommendations. Electronic Signature(s) Signed: 07/13/2019 11:01:00 AM By: [name] PA-C Entered By: [name] on 07/13/2019 11:00:59 -------------------------------------------------------------------------------- SuperBill Details Patient Name: Date of Service: [name] 07/13/2019 Medical Record [id number] Patient Account Number: [id number] Date of Birth/Sex: Treating RN: 1951/04/21 (69 y.o. [name] Primary Care Provider: [name] Other Clinician: Referring Provider: Treating Provider/Extender:Stone III, [name], FRED Weeks in Treatment: 101 Diagnosis Coding ICD-10 Codes Code Description E11.622 Type 2 diabetes mellitus with other skin ulcer I89.0 Lymphedema, not elsewhere classified I87.331 Chronic venous hypertension (idiopathic) with ulcer and inflammation of right lower extremity I87.332 Chronic venous hypertension (idiopathic) with ulcer and inflammation of left lower extremity L97.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 leg with fat layer exposed L97.822 Non-pressure chronic ulcer of other part of left lower leg with fat layer exposed L97.522 Non-pressure chronic ulcer of  other part of left foot with fat layer exposed I10 Essential (primary) hypertension E66.01 Morbid (severe) obesity due to excess calories F41.8 Other specified anxiety disorders R53.1 Weakness Facility Procedures CPT4 Code: [id number] Description: 29580 - APPLY UNNA BOOT/PROFO BILATERAL Modifier: Quantity: 1 Physician Procedures CPT4 Code Description: [id number] - WC PHYS LEVEL 3 - EST PT ICD-10 Diagnosis Description E11.622 Type 2 diabetes mellitus with other skin ulcer I89.0 Lymphedema, not elsewhere classified I87.331 Chronic venous hypertension (idiopathic) with ulcer and  lower extremity I87.332 Chronic venous hypertension (idiopathic) with ulcer and extremity Modifier: inflammation of inflammation of Quantity: 1 right left lower Electronic Signature(s) Signed: 07/13/2019 11:01:14 AM By: [name] PA-C Entered By: [name] on 07/13/2019 11:01:12

## 2019-07-13 NOTE — Progress Notes (Addendum)
CORENA, TILSON ([id number]) Visit Report for 07/13/2019 Arrival Information Details Patient Name: Date of Service: [name], [name] 07/13/2019 9:45 A M Medical Record Number: [id number] Patient Account Number: [id number] Date of Birth/Sex: Treating RN: May 26, 1951 (69 y.o. [name] Primary Care Sharilynn Cassity: [name] Other Clinician: Referring Aneesah Hernan: Treating Mickal Meno/Extender: Doyle Askew, FRED Weeks in Treatment: 101 Visit Information History Since Last Visit All ordered tests and consults were completed: No Patient Arrived: Wheel Chair Added or deleted any medications: No Arrival Time: 10:02 Any new allergies or adverse reactions: No Accompanied By: husband Had a fall or experienced change in No Transfer Assistance: None activities of daily living that may affect Patient Identification Verified: Yes risk of falls: Secondary Verification Process Completed: Yes Signs or symptoms of abuse/neglect since last visito No Patient Requires Transmission-Based Precautions: No Hospitalized since last visit: No Patient Has Alerts: Yes Implantable device outside of the clinic excluding No Patient Alerts: R ABI= 1.01 cellular tissue based products placed in the center L ABI = .99 since last visit: Has Dressing in Place as Prescribed: Yes Has Compression in Place as Prescribed: Yes Pain Present Now: Yes Electronic Signature(s) Signed: 07/13/2019 5:50:55 PM By: Carlene Coria RN Entered By: Carlene Coria on 07/13/2019 10:04:07 -------------------------------------------------------------------------------- Compression Therapy Details Patient Name: Date of Service: [name] Duke. 07/13/2019 9:45 A M Medical Record Number: [id number] Patient Account Number: [id number] Date of Birth/Sex: Treating RN: May 03, 1951 (69 y.o. [name] Primary Care Marcel Sorter: [name] Other Clinician: Referring Polo Mcmartin: Treating Brailen Macneal/Extender: Doyle Askew,  FRED Weeks in Treatment: 101 Compression Therapy Performed for Wound Assessment: Wound #61 Left,Circumferential Lower Leg Performed By: Clinician Carlene Coria, RN Compression Type: Rolena Infante Post Procedure Diagnosis Same as Pre-procedure Electronic Signature(s) Signed: 07/13/2019 6:17:53 PM By: [name] RN, BSN Entered By: [name] on 07/13/2019 10:55:13 -------------------------------------------------------------------------------- Compression Therapy Details Patient Name: Date of Service: [name] Duke. 07/13/2019 9:45 A M Medical Record Number: [id number] Patient Account Number: [id number] Date of Birth/Sex: Treating RN: 04/07/1951 (69 y.o. [name] Primary Care Jacob Cicero: [name] Other Clinician: Referring Jaqualin Serpa: Treating Chucky Homes/Extender: Doyle Askew, FRED Weeks in Treatment: 101 Compression Therapy Performed for Wound Assessment: Wound #63 Right,Medial Malleolus Performed By: Jake Church, RN Compression Type: Rolena Infante Post Procedure Diagnosis Same as Pre-procedure Electronic Signature(s) Signed: 07/13/2019 6:17:53 PM By: [name] RN, BSN Entered By: [name] on 07/13/2019 10:55:13 -------------------------------------------------------------------------------- Compression Therapy Details Patient Name: Date of Service: [name] Duke. 07/13/2019 9:45 A M Medical Record Number: [id number] Patient Account Number: [id number] Date of Birth/Sex: Treating RN: May 03, 1951 (69 y.o. [name] Primary Care Evely Gainey: [name] Other Clinician: Referring Maeli Spacek: Treating Starlee Corralejo/Extender: Doyle Askew, FRED Weeks in Treatment: 101 Compression Therapy Performed for Wound Assessment: Wound #64 Left,Dorsal Foot Performed By: Clinician Carlene Coria, RN Compression Type: Rolena Infante Post Procedure Diagnosis Same as Pre-procedure Electronic Signature(s) Signed: 07/13/2019 6:17:53 PM By:  [name] RN, BSN Entered By: [name] on 07/13/2019 10:55:13 -------------------------------------------------------------------------------- Encounter Discharge Information Details Patient Name: Date of Service: [name] Duke. 07/13/2019 9:45 A M Medical Record Number: [id number] Patient Account Number: [id number] Date of Birth/Sex: Treating RN: [birth year] (69 y.o. [name] Primary Care Annalysse Shoemaker: [name] Other Clinician: Referring Xzayvier Fagin: Treating Carmello Cabiness/Extender: Doyle Askew, FRED Weeks in Treatment: [phone] Encounter Discharge Information Items Discharge Condition: Stable Ambulatory Status: Wheelchair Discharge Destination: Home Transportation: Private Auto Accompanied By: husband Schedule Follow-up Appointment: Yes Clinical Summary of Care: Patient Declined  Electronic Signature(s) Signed: 07/13/2019 5:50:55 PM By: Yevonne Pax RN Entered By: Yevonne Pax on 07/13/2019 11:24:17 -------------------------------------------------------------------------------- Lower Extremity Assessment Details Patient Name: Date of Service: [name], [name] 07/13/2019 9:45 A M Medical Record Number: [id number] Patient Account Number: [id number] Date of Birth/Sex: Treating RN: Jul 19, 1950 (69 y.o. Freddy Finner Primary Care Kamdon Reisig: Fatima Sanger Other Clinician: Referring Tannisha Kennington: Treating Gearldene Fiorenza/Extender: Massie Bougie, FRED Weeks in Treatment: 101 Edema Assessment Assessed: [Left: No] [Right: No] Edema: [Left: Yes] [Right: Yes] Calf Left: Right: Point of Measurement: 37 cm From Medial Instep 44 cm 37 cm Ankle Left: Right: Point of Measurement: 10 cm From Medial Instep 30 cm 26 cm Electronic Signature(s) Signed: 07/13/2019 5:50:55 PM By: Yevonne Pax RN Entered By: Yevonne Pax on 07/13/2019 10:08:26 -------------------------------------------------------------------------------- Pain Assessment Details Patient Name: Date of  Service: [name]. 07/13/2019 9:45 A M Medical Record Number: [id number] Patient Account Number: [id number] Date of Birth/Sex: Treating RN: Aug 05, 1950 (69 y.o. Freddy Finner Primary Care Bailyn Spackman: Fatima Sanger Other Clinician: Referring Trysta Showman: Treating Kenji Mapel/Extender: Massie Bougie, FRED Weeks in Treatment: [phone] Active Problems Location of Pain Severity and Description of Pain Patient Has Paino Yes Site Locations With Dressing Change: Yes Duration of the Pain. Constant / Intermittento Intermittent Rate the pain. Current Pain Level: 5 Worst Pain Level: 10 Least Pain Level: 0 Character of Pain Describe the Pain: Aching, Burning, Throbbing Pain Management and Medication Current Pain Management: Medication: Yes Cold Application: No Rest: Yes Massage: No Activity: No T.E.N.S.: No Heat Application: No Leg drop or elevation: No Is the Current Pain Management Adequate: Inadequate How does your wound impact your activities of daily livingo Sleep: Yes Bathing: No Appetite: Yes Relationship With Others: No Bladder Continence: No Emotions: No Bowel Continence: No Work: No Toileting: No Drive: No Dressing: No Hobbies: No Electronic Signature(s) Signed: 07/13/2019 5:50:55 PM By: Yevonne Pax RN Entered By: Yevonne Pax on 07/13/2019 10:06:56 -------------------------------------------------------------------------------- Wound Assessment Details Patient Name: Date of Service: MERLINDA, WRUBEL 07/13/2019 9:45 A M Medical Record Number: [id number] Patient Account Number: [id number] Date of Birth/Sex: Treating RN: 02/19/1951 (70 y.o. Freddy Finner Primary Care Bolden Hagerman: Fatima Sanger Other Clinician: Referring Kavontae Pritchard: Treating Rahkim Rabalais/Extender: Massie Bougie, FRED Weeks in Treatment: 101 Wound Status Wound Number: 58 Primary Diabetic Wound/Ulcer of the Lower Extremity Etiology: Wound Location: Left Ankle - Medial Wound  Converted Wounding Event: Blister Status: Date Acquired: 04/04/2019 Comorbid Asthma, Hypertension, Peripheral Arterial Disease, Peripheral Weeks Of Treatment: 14 History: Venous Disease, Type II Diabetes, Gout, Osteoarthritis Clustered Wound: No Wound Measurements Length: (cm) 0 Width: (cm) 0 Depth: (cm) 0 Area: (cm) 0 Volume: (cm) 0 % Reduction in Area: 100% % Reduction in Volume: 100% Epithelialization: None Tunneling: No Undermining: No Wound Description Classification: Grade 2 Wound Margin: Distinct, outline attached Exudate Amount: None Present Foul Odor After Cleansing: No Slough/Fibrino No Wound Bed Granulation Amount: None Present (0%) Exposed Structure Necrotic Amount: None Present (0%) Fascia Exposed: No Fat Layer (Subcutaneous Tissue) Exposed: No Tendon Exposed: No Muscle Exposed: No Joint Exposed: No Bone Exposed: No Electronic Signature(s) Signed: 07/13/2019 5:50:55 PM By: Yevonne Pax RN Entered By: Yevonne Pax on 07/13/2019 10:14:05 -------------------------------------------------------------------------------- Wound Assessment Details Patient Name: Date of Service: TEKEYA, GEFFERT 07/13/2019 9:45 A M Medical Record Number: [id number] Patient Account Number: [id number] Date of Birth/Sex: Treating RN: 1950/09/29 (69 y.o. F) Primary Care Tirso Laws: Fatima Sanger Other Clinician: Referring Adian Jablonowski: Treating Elleigh Cassetta/Extender: Massie Bougie, FRED Weeks in Treatment: 101 Wound Status Wound Number: 61  Primary Venous Leg Ulcer Etiology: Wound Location: Left Lower Leg - Circumferential Wound Open Wounding Event: Gradually Appeared Status: Date Acquired: 04/20/2019 Comorbid Asthma, Hypertension, Peripheral Arterial Disease, Peripheral Weeks Of Treatment: 12 History: Venous Disease, Type II Diabetes, Gout, Osteoarthritis Clustered Wound: No Photos Wound Measurements Length: (cm) 12 Width: (cm) 29 Depth: (cm) 0.2 Area: (cm)  273.319 Volume: (cm) 54.664 % Reduction in Area: -3524.9% % Reduction in Volume: -7149.9% Epithelialization: None Tunneling: No Undermining: No Wound Description Classification: Full Thickness Without Exposed Support Structures Wound Margin: Flat and Intact Exudate Amount: Medium Exudate Type: Serosanguineous Exudate Color: red, brown Foul Odor After Cleansing: No Slough/Fibrino Yes Wound Bed Granulation Amount: Medium (34-66%) Exposed Structure Granulation Quality: Pink Fascia Exposed: No Necrotic Amount: Medium (34-66%) Fat Layer (Subcutaneous Tissue) Exposed: Yes Necrotic Quality: Adherent Slough Tendon Exposed: No Muscle Exposed: No Joint Exposed: No Bone Exposed: No Electronic Signature(s) Signed: 07/26/2019 4:28:10 PM By: Benjaman Kindler EMT/HBOT Previous Signature: 07/13/2019 5:50:55 PM Version By: Yevonne Pax RN Entered By: Benjaman Kindler on 07/26/2019 13:45:50 -------------------------------------------------------------------------------- Wound Assessment Details Patient Name: Date of Service: [name]. 07/13/2019 9:45 A M Medical Record Number: [id number] Patient Account Number: [id number] Date of Birth/Sex: Treating RN: 1951/05/05 (69 y.o. Freddy Finner Primary Care Len Kluver: Fatima Sanger Other Clinician: Referring Jocabed Cheese: Treating Rozelle Caudle/Extender: Massie Bougie, FRED Weeks in Treatment: 101 Wound Status Wound Number: 62 Primary Diabetic Wound/Ulcer of the Lower Extremity Etiology: Wound Location: Left Lower Leg - Medial, Proximal Secondary Lymphedema Wounding Event: Gradually Appeared Etiology: Date Acquired: 05/12/2019 Wound Converted Weeks Of Treatment: 8 Status: Clustered Wound: No Comorbid Asthma, Hypertension, Peripheral Arterial Disease, Peripheral History: Venous Disease, Type II Diabetes, Gout, Osteoarthritis Wound Measurements Length: (cm) Width: (cm) Depth: (cm) Area: (cm) Volume: (cm) 0 % Reduction in Area:  100% 0 % Reduction in Volume: 100% 0 Epithelialization: None 0 Undermining: No 0 Wound Description Classification: Grade 2 Wound Margin: Distinct, outline attached Exudate Amount: None Present Foul Odor After Cleansing: No Slough/Fibrino No Wound Bed Granulation Amount: None Present (0%) Exposed Structure Necrotic Amount: None Present (0%) Fascia Exposed: No Fat Layer (Subcutaneous Tissue) Exposed: No Tendon Exposed: No Muscle Exposed: No Joint Exposed: No Bone Exposed: No Electronic Signature(s) Signed: 07/13/2019 5:50:55 PM By: Yevonne Pax RN Entered By: Yevonne Pax on 07/13/2019 10:14:46 -------------------------------------------------------------------------------- Wound Assessment Details Patient Name: Date of Service: YULISSA, NEEDHAM 07/13/2019 9:45 A M Medical Record Number: [id number] Patient Account Number: [id number] Date of Birth/Sex: Treating RN: 1950/10/07 (69 y.o. F) Primary Care Reda Gettis: Fatima Sanger Other Clinician: Referring Alvie Fowles: Treating Takahiro Godinho/Extender: Massie Bougie, FRED Weeks in Treatment: 101 Wound Status Wound Number: 63 Primary Venous Leg Ulcer Etiology: Wound Location: Right Malleolus - Medial Wound Open Wounding Event: Gradually Appeared Status: Date Acquired: 05/17/2019 Comorbid Asthma, Hypertension, Peripheral Arterial Disease, Peripheral Weeks Of Treatment: 6 History: Venous Disease, Type II Diabetes, Gout, Osteoarthritis Clustered Wound: No Photos Wound Measurements Length: (cm) 8 Width: (cm) 4 Depth: (cm) 0.2 Area: (cm) 25.133 Volume: (cm) 5.027 % Reduction in Area: -1438.1% % Reduction in Volume: -2984% Epithelialization: None Tunneling: No Undermining: No Wound Description Classification: Full Thickness Without Exposed Support Structures Wound Margin: Well defined, not attached Exudate Amount: Medium Exudate Type: Serosanguineous Exudate Color: red, brown Foul Odor After Cleansing:  No Slough/Fibrino Yes Wound Bed Granulation Amount: Small (1-33%) Exposed Structure Granulation Quality: Pale Fascia Exposed: No Necrotic Amount: Large (67-100%) Fat Layer (Subcutaneous Tissue) Exposed: Yes Necrotic Quality: Eschar, Adherent Slough Tendon Exposed: No Muscle Exposed: No Joint Exposed: No Bone  Exposed: No Electronic Signature(s) Signed: 07/26/2019 4:28:10 PM By: Benjaman Kindler EMT/HBOT Previous Signature: 07/13/2019 5:50:55 PM Version By: Yevonne Pax RN Entered By: Benjaman Kindler on 07/26/2019 13:48:00 -------------------------------------------------------------------------------- Wound Assessment Details Patient Name: Date of Service: [name]. 07/13/2019 9:45 A M Medical Record Number: [id number] Patient Account Number: [id number] Date of Birth/Sex: Treating RN: 10/06/1950 (69 y.o. Freddy Finner Primary Care Kerianne Gurr: Fatima Sanger Other Clinician: Referring Tomie Spizzirri: Treating Annia Gomm/Extender: Massie Bougie, FRED Weeks in Treatment: 101 Wound Status Wound Number: 64 Primary Diabetic Wound/Ulcer of the Lower Extremity Etiology: Wound Location: Left Foot - Dorsal Wound Open Wounding Event: Gradually Appeared Status: Date Acquired: 06/01/2019 Comorbid Asthma, Hypertension, Peripheral Arterial Disease, Peripheral Weeks Of Treatment: 6 History: Venous Disease, Type II Diabetes, Gout, Osteoarthritis Clustered Wound: No Photos Wound Measurements Length: (cm) 10 Width: (cm) 8 Depth: (cm) 0.1 Area: (cm) 62.832 Volume: (cm) 6.283 % Reduction in Area: -37980% % Reduction in Volume: -12722.4% Epithelialization: None Tunneling: No Undermining: No Wound Description Classification: Grade 1 Wound Margin: Flat and Intact Exudate Amount: Medium Exudate Type: Serosanguineous Exudate Color: red, brown Foul Odor After Cleansing: No Slough/Fibrino Yes Wound Bed Granulation Amount: Medium (34-66%) Exposed Structure Granulation Quality:  Pink Fascia Exposed: No Necrotic Amount: Medium (34-66%) Fat Layer (Subcutaneous Tissue) Exposed: Yes Necrotic Quality: Adherent Slough Tendon Exposed: No Muscle Exposed: No Joint Exposed: No Bone Exposed: No Electronic Signature(s) Signed: 07/26/2019 4:28:10 PM By: Benjaman Kindler EMT/HBOT Signed: 12/13/2019 5:27:17 PM By: Yevonne Pax RN Previous Signature: 07/13/2019 5:50:55 PM Version By: Yevonne Pax RN Entered By: Benjaman Kindler on 07/26/2019 13:47:28 -------------------------------------------------------------------------------- Vitals Details Patient Name: Date of Service: [name]. 07/13/2019 9:45 A M Medical Record Number: [id number] Patient Account Number: [id number] Date of Birth/Sex: Treating RN: [birth year] (69 y.o. Freddy Finner Primary Care Jemar Paulsen: Fatima Sanger Other Clinician: Referring Ciearra Rufo: Treating Maynard David/Extender: Massie Bougie, FRED Weeks in Treatment: 101 Vital Signs Time Taken: 10:04 Temperature (F): 98.2 Height (in): 62 Pulse (bpm): 79 Weight (lbs): 335 Respiratory Rate (breaths/min): 20 Body Mass Index (BMI): 61.3 Blood Pressure (mmHg): 128/90 Reference Range: 80 - 120 mg / dl Electronic Signature(s) Signed: 07/13/2019 5:50:55 PM By: Yevonne Pax RN Entered By: Yevonne Pax on 07/13/2019 10:05:51

## 2019-07-20 ENCOUNTER — Encounter (HOSPITAL_COMMUNITY): Payer: Self-pay | Admitting: Emergency Medicine

## 2019-07-20 ENCOUNTER — Emergency Department (HOSPITAL_BASED_OUTPATIENT_CLINIC_OR_DEPARTMENT_OTHER): Payer: Medicare PPO

## 2019-07-20 ENCOUNTER — Emergency Department (HOSPITAL_COMMUNITY)
Admission: EM | Admit: 2019-07-20 | Discharge: 2019-07-20 | Disposition: A | Discharge status: Home / Self Care | Payer: Medicare PPO | Attending: Emergency Medicine | Admitting: Emergency Medicine

## 2019-07-20 ENCOUNTER — Emergency Department (HOSPITAL_COMMUNITY): Payer: Medicare PPO

## 2019-07-20 ENCOUNTER — Other Ambulatory Visit: Payer: Self-pay

## 2019-07-20 DIAGNOSIS — L03116 Cellulitis of left lower limb: Secondary | ICD-10-CM | POA: Diagnosis not present

## 2019-07-20 DIAGNOSIS — J45909 Unspecified asthma, uncomplicated: Secondary | ICD-10-CM | POA: Diagnosis not present

## 2019-07-20 DIAGNOSIS — Z7982 Long term (current) use of aspirin: Secondary | ICD-10-CM | POA: Diagnosis not present

## 2019-07-20 DIAGNOSIS — E119 Type 2 diabetes mellitus without complications: Secondary | ICD-10-CM | POA: Insufficient documentation

## 2019-07-20 DIAGNOSIS — R2242 Localized swelling, mass and lump, left lower limb: Secondary | ICD-10-CM | POA: Diagnosis present

## 2019-07-20 DIAGNOSIS — Z8616 Personal history of COVID-19: Secondary | ICD-10-CM | POA: Diagnosis not present

## 2019-07-20 DIAGNOSIS — Z7984 Long term (current) use of oral hypoglycemic drugs: Secondary | ICD-10-CM | POA: Insufficient documentation

## 2019-07-20 DIAGNOSIS — Z79899 Other long term (current) drug therapy: Secondary | ICD-10-CM | POA: Insufficient documentation

## 2019-07-20 DIAGNOSIS — I10 Essential (primary) hypertension: Secondary | ICD-10-CM | POA: Insufficient documentation

## 2019-07-20 DIAGNOSIS — I878 Other specified disorders of veins: Secondary | ICD-10-CM | POA: Diagnosis not present

## 2019-07-20 DIAGNOSIS — R609 Edema, unspecified: Secondary | ICD-10-CM | POA: Diagnosis not present

## 2019-07-20 LAB — BASIC METABOLIC PANEL
Anion gap: 11 (ref 5–15)
BUN: 14 mg/dL (ref 8–23)
CO2: 25 mmol/L (ref 22–32)
Calcium: 9.8 mg/dL (ref 8.9–10.3)
Chloride: 105 mmol/L (ref 98–111)
Creatinine, Ser: 1.03 mg/dL — ABNORMAL HIGH (ref 0.44–1.00)
GFR calc Af Amer: >60 mL/min (ref >60)
GFR calc non Af Amer: 55 mL/min — ABNORMAL LOW (ref >60)
Glucose, Bld: 147 mg/dL — ABNORMAL HIGH (ref 70–99)
Potassium: 4.1 mmol/L (ref 3.5–5.1)
Sodium: 141 mmol/L (ref 135–145)

## 2019-07-20 LAB — CBC
HCT: 42.4 % (ref 36.0–46.0)
Hemoglobin: 13.2 g/dL (ref 12.0–15.0)
MCH: 28.6 pg (ref 26.0–34.0)
MCHC: 31.1 g/dL (ref 30.0–36.0)
MCV: 92 fL (ref 80.0–100.0)
Platelets: 325 10*3/uL (ref 150–400)
RBC: 4.61 MIL/uL (ref 3.87–5.11)
RDW: 18.7 % — ABNORMAL HIGH (ref 11.5–15.5)
WBC: 9.1 10*3/uL (ref 4.0–10.5)
nRBC: 0 % (ref 0.0–0.2)

## 2019-07-20 MED ORDER — DOXYCYCLINE HYCLATE 100 MG PO CAPS: 100.0000 mg | ORAL_CAPSULE | Freq: Two times a day (BID) | ORAL | 0 refills | Status: DC

## 2019-07-20 NOTE — ED Triage Notes (Signed)
Pt arrives to ED from home with complaints of increased swelling in the left leg since Monday 2/1. Patient reports she is being treated for cellulitis in her legs with wound care at this time. Patients left leg is oozing and has an odor.

## 2019-07-20 NOTE — ED Notes (Signed)
Pt discharge instructions reviewed with the patient. The patient verbalized understanding of discharge instructions. Pt discharged. 

## 2019-07-20 NOTE — Discharge Instructions (Signed)
Follow up with wound care this week. Take the antibiotics as prescribed.  Return to the ED for new or worsening symptoms.

## 2019-07-20 NOTE — ED Provider Notes (Addendum)
[place] [facility] EMERGENCY DEPARTMENT Provider Note   CSN: [id number] Arrival date & time: 07/20/19  1126     History Chief Complaint  Patient presents with  . Leg Swelling    [name] is a 69 y.o. female with past medical history significant for arthritis, chronic back pain, diabetes, gout, TIA, venous stasis who presents for evaluation of leg wounds.  Patient states she has had chronic leg wounds times years.  She is followed outpatient by wound care.  Patient states she has noticed for 1 week unilateral leg swelling to her left leg.  States her wounds are weeping.  She called her PCP who told her to come here for evaluation.  Was on doxycycline for possible cellulitis 3 months ago.  Patient was recently admitted for COVID-19 to [facility] discharged on 06/29/2019.  She denies any anticoagulation at home.  Denies fever, chills, nausea, vomiting, chest pain, shortness of breath, abdominal pain, diarrhea, dysuria, paresthesias, warmth or increased pain to extremities.  Denies additional aggravating or alleviating factors.  History obtained from patient and past medical records.  No interpreter is used.   Followed by wound care and [facility]. Home health come Mon/Fri       Per last WC note on 07/13/19 05/18/2019 on evaluation today patient appears to be doing well with regard to her lower extremity on the right in fact this is showing no signs of any open wounds at this time. On the left she is continuing to have issues with areas that do drain. Some of the regions have healed and there are couple areas that have reopened. She did go to the ER per the patient according to recommendations from the home health nurse due to what she was seen when she came out on 05/13/2019. Subsequently she felt like the patient needed to go to the hospital due to the fact that again she was having "milky white discharge" from her leg. Nonetheless she had and then was placed  on doxycycline and subsequently seems to be doing better. 06/01/2019 upon evaluation today patient appears to be doing really in my opinion about the same. I do not see any signs of active infection which is good news. Overall she still has wounds over the bilateral lower extremities she has reopened on the right but this appears to be more of a crack where there is weeping/edema coming from the region. I do not see any evidence of infection at either site based on what I visualized today. 07/13/2019 upon evaluation today patient appears to be doing a little worse compared to last time I saw her. She since has been in the hospital from 06/21/2019 through 06/29/2019. This was secondary to having Covid. During that time they did apply lotion to her legs which unfortunately has caused her to develop a myriad of open wounds on her lower extremities. Her legs do appear to be doing better as far as the overall appearance is concerned but nonetheless she does have more open and weeping areas. Electronic Signature(s) Signed: 07/13/2019 10:59:21 AM By: [name] PA-C Entered By: [name] on 07/13/2019 10:59:21  HPI     Past Medical History:  Diagnosis Date  . Anxiety   . Arthritis    "back, arms, legs" (03/24/2016)  . Asthma   . Chronic lower back pain   . Colonic polyp    last colonoscopy done in 2009 with normal results per medical record  . Depressive disorder   .  Gastric polyp   . Gout    has taken allopurinol 336m once daily in past  . Headache   . History of hiatal hernia   . Hypertension   . Migraine    "none in awhile; might have a couple/year" (03/24/2016)  . Mixed hyperlipidemia    01/2016 Total chol 141, HDL 59, LDL 63, ration 1.1  . Osteoarthritis   . TIA (transient ischemic attack) 11/2014  . Type II diabetes mellitus ([facility]     Patient Active Problem List   Diagnosis Date Noted  . Pneumonia due to COVID-19 virus 06/29/2019  . Hypophosphatemia 06/24/2019   . Morbidly obese ([facility] 06/23/2019  . Diabetes mellitus type 2, uncontrolled, with complications ([facility] [place] 096/22/2979 . Anxiety 06/22/2019  . Depressive disorder 06/22/2019  . Essential hypertension 06/22/2019  . HLD (hyperlipidemia) 06/22/2019  . COVID-19 06/21/2019  . Severe protein-calorie malnutrition ([facility] 06/21/2019  . Acute respiratory failure with hypoxia ([facility] 06/21/2019  . Diabetic foot ulcer associated with type 2 diabetes mellitus ([facility] [place]   . Cellulitis 07/05/2016  . Depression with anxiety 05/30/2016  . Chronic skin ulcer of lower leg ([facility] 05/30/2016  . Generalized weakness 05/30/2016  . Left arm weakness 05/30/2016  . Left Lower extremity pain  04/02/2016  . Lower extremity pain, inferior, left 04/02/2016  . Left leg pain   . Personal history of noncompliance with medical treatment, presenting hazards to health 03/31/2016  . Jaw pain 03/27/2016  . Left leg cellulitis 03/24/2016  . Cellulitis of left lower extremity   . Primary insomnia   . Adjustment disorder   . Morbid obesity ([facility] 01/27/2016  . Wheelchair dependent 01/27/2016  . Gout 01/27/2016  . Asthma 01/27/2016  . Arthritis 01/27/2016  . Uncontrolled diabetes mellitus type 2 with peripheral artery disease ([facility] 01/25/2016  . Cellulitis of left leg 01/11/2016  . Insulin-requiring or dependent type II diabetes mellitus ([facility] 01/11/2016  . Hypertension 01/11/2016    Past Surgical History:  Procedure Laterality Date  . ARTERY BIOPSY Right 08/12/2016   Procedure: BIOPSY TEMPORAL ARTERY;  Surgeon: CElam [name] MD;  [place] [facility]  Service: Vascular;  Laterality: Right;  BIOPSY TEMPORAL ARTERY  . BREAST BIOPSY Left ~ 2015   benign  . CARPAL TUNNEL RELEASE Bilateral   . DILATION AND CURETTAGE OF UTERUS    . [place]ROS[place] Right [place] . LAPAROSCOPIC CHOLECYSTECTOMY    . TUBAL LIGATION    . VAGINAL HYSTERECTOMY  1982     OB History   No obstetric history on file.     Family History   Problem Relation Age of Onset  . Stroke Father   . Stroke Brother   . Cancer Other     Social History   Tobacco Use  . Smoking status: Never Smoker  . Smokeless tobacco: Never Used  Substance Use Topics  . Alcohol use: No  . Drug use: No    Home Medications Prior to Admission medications   Medication Sig Start Date End Date Taking? Authorizing Provider  ACCU-CHEK FASTCLIX LANCETS MISC 1 each as needed. 03/20/17   [provider]  ACCU-CHEK SMARTVIEW test strip 1 each 4 (four) times daily. for testing 06/15/17   [provider]  acetaminophen (TYLENOL) 500 MG tablet Take 500 mg by mouth daily as needed for moderate pain.    [provider]  albuterol (PROVENTIL HFA;VENTOLIN HFA) 108 (90 Base) MCG/ACT inhaler Inhale 2 puffs into the lungs every 4 (four) hours as  needed for wheezing or shortness of breath.     [provider]  allopurinol (ZYLOPRIM) 300 MG tablet Take 300 mg by mouth daily.    [provider]  aspirin 325 MG tablet Take 325 mg by mouth daily.    [provider]  atenolol (TENORMIN) 25 MG tablet Take 25-50 mg by mouth 2 (two) times daily. Take 50 mg every morning and then 25 mg every evening    [provider]  Blood Glucose Monitoring Suppl (ACCU-CHEK NANO SMARTVIEW) w/Device KIT 1 each See admin instructions. 05/23/17   [provider]  Cholecalciferol (VITAMIN D3) 2000 units capsule Take 2,000 Units by mouth daily.    [provider]  cyclobenzaprine (FLEXERIL) 10 MG tablet Take 10 mg by mouth at bedtime as needed for muscle spasms.     [provider]  diclofenac sodium (VOLTAREN) 1 % GEL Apply 4 g topically 4 (four) times daily. Patient taking differently: Apply 4 g topically 4 (four) times daily as needed (pain).  02/25/16   Henson, [name] L, NP-C  Diclofenac Sodium CR (VOLTAREN-XR) 100 MG 24 hr tablet Take 1 tablet (100 mg total) by mouth daily. 09/09/17   Palumbo, April, MD   doxycycline (VIBRAMYCIN) 100 MG capsule Take 1 capsule (100 mg total) by mouth 2 (two) times daily. 07/20/19   Kartel Wolbert A, PA-C  furosemide (LASIX) 20 MG tablet Take 1 tablet (20 mg total) by mouth daily. 07/07/16   Thurnell Lose, MD  gabapentin (NEURONTIN) 300 MG capsule Take 1 capsule (300 mg total) by mouth 2 (two) times daily. Patient taking differently: Take 600 mg by mouth 2 (two) times daily.  01/31/16   Henson, [name] L, NP-C  hydrocerin (EUCERIN) CREA Apply 1 application topically daily. 01/15/16   Thurnell Lose, MD  insulin degludec (TRESIBA FLEXTOUCH) 100 UNIT/ML SOPN FlexTouch Pen Inject 0.75 mLs (75 Units total) into the skin daily. Patient taking differently: Inject 75 Units into the skin every morning.  03/14/16   Henson, [name] L, NP-C  magnesium gluconate (MAGONATE) 500 MG tablet Take 500 mg by mouth daily.    [provider]  NIFEdipine (PROCARDIA XL/ADALAT-CC) 90 MG 24 hr tablet Take 90 mg by mouth daily.    [provider]  NOVOFINE 32G X 6 MM MISC 1 each as needed. 08/09/17   [provider]  NOVOLOG FLEXPEN 100 UNIT/ML FlexPen Take 17 Units by mouth 2 (two) times daily. 01/30/17   [provider]  polyethylene glycol (MIRALAX / GLYCOLAX) packet Take 17 g by mouth daily as needed for moderate constipation.    [provider]  potassium chloride (K-DUR) 10 MEQ tablet Take 1 tablet (10 mEq total) by mouth daily. 07/07/16   Thurnell Lose, MD  protein supplement shake (PREMIER PROTEIN) LIQD Take 325 mLs (11 oz total) by mouth daily. 03/18/17   Eugenie Filler, MD    Allergies    Ace inhibitors, Tizanidine, Metformin and related, and Propofol  Review of Systems   Review of Systems  Constitutional: Negative.   HENT: Negative.   Respiratory: Negative.   Cardiovascular: Positive for leg swelling. Negative for chest pain and palpitations.  Gastrointestinal: Negative.   Genitourinary: Negative.   Musculoskeletal:  Negative.   Skin: Positive for wound.  Neurological: Negative.   All other systems reviewed and are negative.   Physical Exam Updated Vital Signs BP 124/64 (BP Location: Right Arm)   Pulse 81   Temp 97.7 F (36.5 C) (Oral)  Resp 20   SpO2 93%   Physical Exam Vitals and nursing note reviewed.  Constitutional:      General: She is not in acute distress.    Appearance: She is well-developed. She is not ill-appearing or toxic-appearing.  HENT:     Head: Normocephalic and atraumatic.     Mouth/Throat:     Mouth: Mucous membranes are moist.  Eyes:     Pupils: Pupils are equal, round, and reactive to light.  Cardiovascular:     Rate and Rhythm: Normal rate.     Pulses:          Dorsalis pedis pulses are 1+ on the right side and 1+ on the left side.     Heart sounds: Normal heart sounds.  Pulmonary:     Effort: Pulmonary effort is normal. No respiratory distress.     Breath sounds: Normal breath sounds.  Abdominal:     General: Bowel sounds are normal. There is no distension.  Musculoskeletal:        General: Swelling and tenderness present. No deformity or signs of injury. Normal range of motion.     Cervical back: Normal range of motion.     Right lower leg: Edema present.     Left lower leg: Edema present.     Comments: Moves all 4 extremities at difficulty.  2+ pitting edema to right lower extremity to mid shin, 3+ pitting edema to left lower extremity to knee.  Patient with extensive wounds and venous stasis skin changes to lower extremities.  Feet:     Right foot:     Skin integrity: Ulcer, skin breakdown, erythema, callus and fissure present. No warmth.     Toenail Condition: Right toenails are abnormally thick and long. Fungal disease present.    Left foot:     Skin integrity: Ulcer, skin breakdown, erythema, callus, dry skin and fissure present. No warmth.     Toenail Condition: Left toenails are abnormally thick and long. Fungal disease present.    Comments: Intact  sensation to lower extremities Skin:    General: Skin is warm and dry.     Capillary Refill: Capillary refill takes 2 to 3 seconds.     Comments: Patient with venous stasis skin changes bilateral lower extremities as well as open, weeping wounds to bilateral lower extremities, left greater than right.  She has pitting edema to lower extremities however left greater than right.  Neurological:     Mental Status: She is alert.     Cranial Nerves: Cranial nerves are intact.     Sensory: Sensation is intact.     Motor: Motor function is intact.     Coordination: Coordination is intact.     Gait: Gait is intact.     Comments: Ambulatory without difficulty               ED Results / Procedures / Treatments   Labs (all labs ordered are listed, but only abnormal results are displayed) Labs Reviewed  BASIC METABOLIC PANEL - Abnormal; Notable for the following components:      Result Value   Glucose, Bld 147 (*)    Creatinine, Ser 1.03 (*)    GFR calc non Af Amer 55 (*)    All other components within normal limits  CBC - Abnormal; Notable for the following components:   RDW 18.7 (*)    All other components within normal limits    EKG None  Radiology DG Tibia/Fibula Left  Result  Date: 07/20/2019 CLINICAL DATA:  Left lower extremity diabetic ulcers. EXAM: LEFT TIBIA AND FIBULA - 2 VIEW COMPARISON:  None. FINDINGS: There is no evidence of fracture or other focal bone lesions. Soft tissues are unremarkable. No lytic destruction is seen to suggest osteomyelitis. IMPRESSION: Negative. Electronically Signed   By: Marijo Conception M.D.   On: 07/20/2019 12:59   DG Foot Complete Left  Result Date: 07/20/2019 CLINICAL DATA:  Diabetic ulcers. EXAM: LEFT FOOT - COMPLETE 3+ VIEW COMPARISON:  None. FINDINGS: There is no evidence of fracture or dislocation. There is no evidence of arthropathy or other focal bone abnormality. No lytic destruction is noted to suggest osteomyelitis. Dorsal soft  tissue swelling is noted consistent with infection. IMPRESSION: No fracture or dislocation is noted. No lytic destruction is noted to suggest osteomyelitis. Dorsal soft tissue swelling is noted consistent with infection. Electronically Signed   By: Marijo Conception M.D.   On: 07/20/2019 13:02   VAS [place] LOWER EXTREMITY VENOUS (DVT) (ONLY [facility] & [facility])  Result Date: 07/20/2019  Lower Venous DVTStudy Indications: Edema.  Limitations: Body habitus and poor ultrasound/tissue interface. Comparison Study: no prior Performing Technologist: Abram Sander RVS  Examination Guidelines: A complete evaluation includes B-mode imaging, spectral Doppler, color Doppler, and power Doppler as needed of all accessible portions of each vessel. Bilateral testing is considered an integral part of a complete examination. Limited examinations for reoccurring indications may be performed as noted. The reflux portion of the exam is performed with the patient in reverse Trendelenburg.  +---------+---------------+---------+-----------+----------+--------------+ RIGHT    CompressibilityPhasicitySpontaneityPropertiesThrombus Aging +---------+---------------+---------+-----------+----------+--------------+ CFV      Full           Yes      Yes                                 +---------+---------------+---------+-----------+----------+--------------+ SFJ      Full                                                        +---------+---------------+---------+-----------+----------+--------------+ FV Prox  Full                                                        +---------+---------------+---------+-----------+----------+--------------+ FV Mid   Full                                                        +---------+---------------+---------+-----------+----------+--------------+ FV DistalFull                                                         +---------+---------------+---------+-----------+----------+--------------+ PFV      Full                                                        +---------+---------------+---------+-----------+----------+--------------+  POP      Full           Yes      Yes                                 +---------+---------------+---------+-----------+----------+--------------+ PTV                     Yes      Yes                                 +---------+---------------+---------+-----------+----------+--------------+ PERO                                                  Not visualized +---------+---------------+---------+-----------+----------+--------------+   +---------+---------------+---------+-----------+----------+--------------+ LEFT     CompressibilityPhasicitySpontaneityPropertiesThrombus Aging +---------+---------------+---------+-----------+----------+--------------+ CFV      Full           Yes      Yes                                 +---------+---------------+---------+-----------+----------+--------------+ SFJ      Full                                                        +---------+---------------+---------+-----------+----------+--------------+ FV Prox  Full                                                        +---------+---------------+---------+-----------+----------+--------------+ FV Mid   Full                                                        +---------+---------------+---------+-----------+----------+--------------+ FV DistalFull                                                        +---------+---------------+---------+-----------+----------+--------------+ PFV      Full                                                        +---------+---------------+---------+-----------+----------+--------------+ POP      Full           Yes      Yes                                  +---------+---------------+---------+-----------+----------+--------------+  PTV      Full                                                        +---------+---------------+---------+-----------+----------+--------------+ PERO                                                  Not visualized +---------+---------------+---------+-----------+----------+--------------+     Summary: BILATERAL: - No evidence of deep vein thrombosis seen in the lower extremities, bilaterally.   *See table(s) above for measurements and observations.    Preliminary     Procedures Procedures (including critical care time)  Medications Ordered in ED Medications - No data to display  ED Course  I have reviewed the triage vital signs and the nursing notes.  Pertinent labs & imaging results that were available during my care of the patient were reviewed by me and considered in my medical decision making (see chart for details).  69 year old female appear otherwise well presents for leg wounds. Followed by wound care and home health.  Afebrile, nonseptic appearing.  No prior history of DVT or PE however increased swelling to her left lower extremity.  Was recently hospitalized for COVID-19 and discharged 2 weeks ago.  Not discharged on anticoagulation.  Chest pain, shortness of breath.  Patient with venous stasis skin changes, weeping wounds, diabetic ulcers and pitting edema to bilateral lower extremities however left greater than right.  No systemic symptoms to suggest sepsis.  Plan for labs, imaging and reevaluation.  Per last WC note on 1/27--patient with weeping, open wounds to bilateral lower extremities.  Labs and imaging personally reviewed and interpreted: CBC without leukocytosis BMP with mild hyperglycemia, creatinine 1.03 Xray Left foot with soft tissue swelling consistent with infection.  Gas-forming organism, osteomyelitis, fracture, dislocation. X-ray left tib-fib without any acute abnormality,  specifically no soft tissue swelling, gas-forming organism, osteomyelitis, fracture or dislocation. [place] negative for DVT bilaterally per Tech and preliminary results  Patient without evidence of sepsis or systemic infection on exam. Followed by wound care previously.  Patient prefers outpatient treatment.  Will start on p.o. antibiotics for possible cellulitis and have close follow up with wound care. She is to continue to have home health come out and change her dressings. Patient did not want to wait for official results of DVT US. Preliminary results per tech negative for DVT. She is to follow up with PCP for official results or to return to ED for worsening symptoms. No CP, SOB low suspicion for PE. Discussed risk vs benefit of not waiting for official US results. Patient voiced understanding of leaving with only prelim results. States she will follow up outpatient. Patient understanding that leaving without official results could mean missed clot that could require inpatient anticoagulation, limb threatening condition or death. Patient to return if she has any new or worsening symptoms.  Pulses equal bilaterally, low suspicion for acute vascular occlusion.  No chest pain, shortness of breath, tachycardia, tachypnea or hypoxia to suggest CHF exacerbation as cause of her fluid overload.   Patient seen and evaluated by Dr. Tyrone Nine who agrees with above treatment, plan and disposition.    MDM Rules/Calculators/A&P  Final Clinical Impression(s) / ED Diagnoses Final diagnoses:  Venous stasis  Cellulitis of left lower extremity    Rx / DC Orders ED Discharge Orders         Ordered    doxycycline (VIBRAMYCIN) 100 MG capsule  2 times daily     07/20/19 1602           Ruth Tully A, PA-C 07/20/19 1604    Ladeana Laplant A, PA-C 07/20/19 1927    Malajah Oceguera A, PA-C 07/20/19 Kirkwood, Bells, DO 07/21/19 0701

## 2019-07-20 NOTE — Progress Notes (Signed)
Lower extremity venous has been completed.   Preliminary results in CV Proc.   Blanch Media 07/20/2019 2:59 PM

## 2019-07-21 ENCOUNTER — Telehealth (HOSPITAL_COMMUNITY): Payer: Self-pay | Admitting: Physician Assistant

## 2019-07-21 NOTE — Telephone Encounter (Signed)
I personally evaluated patient today for worsening leg wounds and swelling. Followed by wound clinic. Patient requested to leave with prelim [place] results negative for DVT per [place] tech, pending official read by Radiology. Patient requests to follow up outpatient for official US results after discussing risk vs benefit of leaving with only prelim results of U. Official results of [place] Negative for DVT bilaterally. LEFT- recanalized femoral thrombus. Called to discussed results with patient and reevaluation needed at Contact number in Epic [phone] and emergency contact in Epic- Velencia Lenart [phone]  however no answer to either line. HIPPA compliant message left on patient number.

## 2019-07-27 ENCOUNTER — Other Ambulatory Visit (HOSPITAL_COMMUNITY)
Admission: RE | Admit: 2019-07-27 | Discharge: 2019-07-27 | Disposition: A | Discharge status: Home / Self Care | Payer: Medicare PPO | Source: Other Acute Inpatient Hospital | Attending: Physician Assistant | Admitting: Physician Assistant

## 2019-07-27 ENCOUNTER — Other Ambulatory Visit: Payer: Self-pay

## 2019-07-27 ENCOUNTER — Encounter (HOSPITAL_BASED_OUTPATIENT_CLINIC_OR_DEPARTMENT_OTHER): Payer: Medicare PPO | Attending: Physician Assistant | Admitting: Physician Assistant

## 2019-07-27 DIAGNOSIS — M109 Gout, unspecified: Secondary | ICD-10-CM | POA: Insufficient documentation

## 2019-07-27 DIAGNOSIS — I69851 Hemiplegia and hemiparesis following other cerebrovascular disease affecting right dominant side: Secondary | ICD-10-CM | POA: Insufficient documentation

## 2019-07-27 DIAGNOSIS — J45909 Unspecified asthma, uncomplicated: Secondary | ICD-10-CM | POA: Insufficient documentation

## 2019-07-27 DIAGNOSIS — E11622 Type 2 diabetes mellitus with other skin ulcer: Secondary | ICD-10-CM | POA: Insufficient documentation

## 2019-07-27 DIAGNOSIS — I10 Essential (primary) hypertension: Secondary | ICD-10-CM | POA: Insufficient documentation

## 2019-07-27 DIAGNOSIS — M1990 Unspecified osteoarthritis, unspecified site: Secondary | ICD-10-CM | POA: Insufficient documentation

## 2019-07-27 DIAGNOSIS — L97522 Non-pressure chronic ulcer of other part of left foot with fat layer exposed: Secondary | ICD-10-CM | POA: Insufficient documentation

## 2019-07-27 DIAGNOSIS — B999 Unspecified infectious disease: Secondary | ICD-10-CM | POA: Insufficient documentation

## 2019-07-27 DIAGNOSIS — Z9119 Patient's noncompliance with other medical treatment and regimen: Secondary | ICD-10-CM | POA: Insufficient documentation

## 2019-07-27 DIAGNOSIS — Z6841 Body Mass Index (BMI) 40.0 and over, adult: Secondary | ICD-10-CM | POA: Insufficient documentation

## 2019-07-27 DIAGNOSIS — R531 Weakness: Secondary | ICD-10-CM | POA: Insufficient documentation

## 2019-07-27 DIAGNOSIS — G43909 Migraine, unspecified, not intractable, without status migrainosus: Secondary | ICD-10-CM | POA: Insufficient documentation

## 2019-07-27 DIAGNOSIS — F329 Major depressive disorder, single episode, unspecified: Secondary | ICD-10-CM | POA: Insufficient documentation

## 2019-07-27 DIAGNOSIS — E1140 Type 2 diabetes mellitus with diabetic neuropathy, unspecified: Secondary | ICD-10-CM | POA: Insufficient documentation

## 2019-07-27 DIAGNOSIS — F419 Anxiety disorder, unspecified: Secondary | ICD-10-CM | POA: Insufficient documentation

## 2019-07-27 DIAGNOSIS — L97822 Non-pressure chronic ulcer of other part of left lower leg with fat layer exposed: Secondary | ICD-10-CM | POA: Insufficient documentation

## 2019-07-27 DIAGNOSIS — L97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 leg with fat layer exposed: Secondary | ICD-10-CM | POA: Insufficient documentation

## 2019-07-27 DIAGNOSIS — I890 Lymphedema, not elsewhere classified: Secondary | ICD-10-CM | POA: Insufficient documentation

## 2019-07-27 DIAGNOSIS — I87331 Chronic venous hypertension (idiopathic) with ulcer and inflammation of right lower extremity: Secondary | ICD-10-CM | POA: Insufficient documentation

## 2019-07-27 DIAGNOSIS — I87332 Chronic venous hypertension (idiopathic) with ulcer and inflammation of left lower extremity: Secondary | ICD-10-CM | POA: Insufficient documentation

## 2019-07-27 NOTE — Progress Notes (Addendum)
JEANNIE, MALLINGER ([id number]) Visit Report for 07/27/2019 Arrival Information Details Patient Name: Date of Service: JISELL, MAJER 07/27/2019 9:45 AM Medical Record [id number] Patient Account Number: [id number] Date of Birth/Sex: Treating RN: 01-Feb-1951 (69 y.o. [name] Primary Care Pebble Botkin: [name] Other Clinician: Referring Jadrien Narine: Treating Trejuan Matherne/Extender:Stone III, [name], FRED Weeks in Treatment: 103 Visit Information History Since Last Visit All ordered tests and consults were completed: No Patient Arrived: Wheel Chair Added or deleted any medications: Yes Arrival Time: 10:07 Any new allergies or adverse reactions: No Accompanied By: self Had a fall or experienced change in No activities of daily living that may affect Transfer Assistance: None risk of falls: Patient Identification Verified: Yes Signs or symptoms of abuse/neglect since last No Patient Requires Transmission-Based No visito Precautions: Hospitalized since last visit: No Patient Has Alerts: Yes Implantable device outside of the clinic excluding No Patient Alerts: R ABI= cellular tissue based products placed in the center 1.01 since last visit: L ABI = Has Dressing in Place as Prescribed: Yes .99 Has Compression in Place as Prescribed: Yes Pain Present Now: No Electronic Signature(s) Signed: 07/27/2019 4:53:37 PM By: Carlene Coria RN Entered By: Carlene Coria on 07/27/2019 10:10:42 -------------------------------------------------------------------------------- Compression Therapy Details Patient Name: Date of Service: [name] 07/27/2019 9:45 AM Medical Record [id number] Patient Account Number: [id number] Date of Birth/Sex: Treating RN: [birth year] (69 y.o. [name] Primary Care Thaddeaus Monica: [name] Other Clinician: Referring Filippo Puls: Treating Kahlel Peake/Extender:Stone III, [name], FRED Weeks in Treatment: 103 Compression Therapy  Performed for Wound Wound #61 Left,Circumferential Lower Leg Assessment: Performed By: Clinician Carlene Coria, RN Compression Type: Three Layer Post Procedure Diagnosis Same as Pre-procedure Electronic Signature(s) Signed: 07/27/2019 5:39:41 PM By: [name] RN, BSN Entered By: [name] on 07/27/2019 10:46:49 -------------------------------------------------------------------------------- Compression Therapy Details Patient Name: Date of Service: [name] 07/27/2019 9:45 AM Medical Record [id number] Patient Account Number: [id number] Date of Birth/Sex: Treating RN: 12-15-1950 (69 y.o. [name] Primary Care Mehran Guderian: [name] Other Clinician: Referring Janiyah Beery: Treating Joanann Mies/Extender:Stone III, [name], FRED Weeks in Treatment: 103 Compression Therapy Performed for Wound Wound #63 Right,Medial Malleolus Assessment: Performed By: Jake Church, RN Compression Type: Three Layer Post Procedure Diagnosis Same as Pre-procedure Electronic Signature(s) Signed: 07/27/2019 5:39:41 PM By: [name] RN, BSN Entered By: [name] on 07/27/2019 10:46:50 -------------------------------------------------------------------------------- Compression Therapy Details Patient Name: Date of Service: [name] 07/27/2019 9:45 AM Medical Record [id number] Patient Account Number: [id number] Date of Birth/Sex: Treating RN: [birth year] (69 y.o. [name] Primary Care Honestii Marton: [name] Other Clinician: Referring Stefanie Hodgens: Treating Constantine Ruddick/Extender:Stone III, [name], FRED Weeks in Treatment: 103 Compression Therapy Performed for Wound Wound #64 Left,Dorsal Foot Assessment: Performed By: Clinician Carlene Coria, RN Compression Type: Three Layer Post Procedure Diagnosis Same as Pre-procedure Electronic Signature(s) Signed: 07/27/2019 5:39:41 PM By: [name] RN, BSN Entered By: [name] on  07/27/2019 10:46:50 -------------------------------------------------------------------------------- Encounter Discharge Information Details Patient Name: Date of Service: [name] 07/27/2019 9:45 AM Medical Record [id number] Patient Account Number: [id number] Date of Birth/Sex: Treating RN: [birth year] (68 y.o. [name] Primary Care Kasi Lasky: [name] Other Clinician: Referring Dhilan Brauer: Treating Willim Turnage/Extender:Stone III, [name], FRED Weeks in Treatment: [phone] Encounter Discharge Information Items Discharge Condition: Stable Ambulatory Status: Wheelchair Discharge Destination: Home Transportation: Private Auto Accompanied By: son Schedule Follow-up Appointment: Yes Clinical Summary of Care: Patient Declined Electronic Signature(s) Signed: 07/27/2019 4:53:37 PM By: Carlene Coria RN Entered By: Carlene Coria on 07/27/2019 11:21:39 -------------------------------------------------------------------------------- Lower Extremity  Assessment Details Patient Name: Date of Service: TYLICIA, SHERMAN 07/27/2019 9:45 AM Medical Record [id number] Patient Account Number: [id number] Date of Birth/Sex: Treating RN: [birth year] (69 y.o. [name] Primary Care Kendall Justo: [name] Other Clinician: Referring Tarrence Enck: Treating Zayyan Mullen/Extender:Stone III, [name], FRED Weeks in Treatment: 103 Edema Assessment Assessed: [Left: No] [Right: No] Edema: [Left: Yes] [Right: Yes] Calf Left: Right: Point of Measurement: 37 cm From Medial Instep 46 cm 41 cm Ankle Left: Right: Point of Measurement: 10 cm From Medial Instep 29.5 cm 24 cm Electronic Signature(s) Signed: 07/27/2019 4:53:37 PM By: Carlene Coria RN Entered By: Carlene Coria on 07/27/2019 10:12:12 -------------------------------------------------------------------------------- [place] Details Patient Name: Date of Service: [name] 07/27/2019 9:45 AM Medical Record  [id number] Patient Account Number: [id number] Date of Birth/Sex: Treating RN: July 22, 1950 (69 y.o. [name] Primary Care Francyne Arreaga: [name] Other Clinician: Referring Mikaiya Tramble: Treating Analee Montee/Extender:Stone III, [name], FRED Weeks in Treatment: [phone] Active Inactive Venous Leg Ulcer Nursing Diagnoses: Actual venous Insuffiency (use after diagnosis is confirmed) Knowledge deficit related to disease process and management Goals: Patient will maintain optimal edema control Date Initiated: 08/12/2017 Target Resolution Date: 08/24/2019 Goal Status: Active Patient/caregiver will verbalize understanding of disease process and disease management Date Initiated: 08/12/2017 Date Inactivated: 04/21/2018 Target Resolution Date: 04/24/2018 Goal Status: Met Interventions: Assess peripheral edema status every visit. Compression as ordered Treatment Activities: Therapeutic compression applied : 08/12/2017 Notes: Wound/Skin Impairment Nursing Diagnoses: Impaired tissue integrity Knowledge deficit related to ulceration/compromised skin integrity Goals: Patient/caregiver will verbalize understanding of skin care regimen Date Initiated: 08/12/2017 Target Resolution Date: 08/24/2019 Goal Status: Active Ulcer/skin breakdown will have a volume reduction of 30% by week 4 Date Initiated: 08/05/2017 Date Inactivated: 09/30/2017 Target Resolution Date: 10/03/2017 Goal Status: Met Ulcer/skin breakdown will have a volume reduction of 50% by week 8 Date Initiated: 09/30/2017 Date Inactivated: 10/28/2017 Target Resolution Date: 10/28/2017 Goal Status: Met Interventions: Assess patient/caregiver ability to perform ulcer/skin care regimen upon admission and as needed Assess ulceration(s) every visit Provide education on ulcer and skin care Screen for HBO Treatment Activities: Patient referred to home care : 08/05/2017 Skin care regimen initiated : 08/05/2017 Topical wound management  initiated : 08/05/2017 Notes: Electronic Signature(s) Signed: 07/27/2019 5:39:41 PM By: [name] RN, BSN Entered By: [name] on 07/27/2019 10:45:50 -------------------------------------------------------------------------------- Pain Assessment Details Patient Name: Date of Service: [name] 07/27/2019 9:45 AM Medical Record [id number] Patient Account Number: [id number] Date of Birth/Sex: Treating RN: Jan 09, 1951 (69 y.o. [name] Primary Care Kohana Amble: [name] Other Clinician: Referring Keala Drum: Treating Niley Helbig/Extender:Stone III, [name], FRED Weeks in Treatment: [phone] Active Problems Location of Pain Severity and Description of Pain Patient Has Paino Yes Site Locations With Dressing Change: Yes With Dressing Change: Yes Duration of the Pain. Constant / Intermittento Intermittent How Long Does it Lasto Hours: Minutes: 15 Rate the pain. Current Pain Level: 6 Worst Pain Level: 9 Least Pain Level: 0 Tolerable Pain Level: 5 Character of Pain Describe the Pain: Aching, Burning Pain Management and Medication Current Pain Management: Medication: Yes Cold Application: No Rest: Yes Massage: No Activity: No T.E.N.S.: No Heat Application: No Leg drop or elevation: No Is the Current Pain Management Adequate: Inadequate How does your wound impact your activities of daily livingo Sleep: Yes Bathing: No Appetite: Yes Relationship With Others: No Bladder Continence: No Emotions: No Bowel Continence: No Work: No Toileting: No Drive: No Dressing: No Hobbies: No Electronic Signature(s) Signed: 07/27/2019 4:53:37 PM By: Carlene Coria RN Entered By:  Carlene Coria on 07/27/2019 10:11:46 -------------------------------------------------------------------------------- Patient/Caregiver Education Details Patient Name: Date of Service: TYLAR, MERENDINO 2/10/2021andnbsp9:45 AM Medical Record Patient Account Number:  [id number] Number: Treating RN: [name] Date of Birth/Gender: 10/01/1950 (69 y.o. Other Clinician: F) Treating [name] Primary Care Physician: [name] Physician/Extender: Referring Physician: Wilhelmenia Blase in Treatment: 55 Education Assessment Education Provided To: Patient Education Topics Provided Infection: Methods: Explain/Verbal Responses: Reinforcements needed, State content correctly Venous: Methods: Explain/Verbal Responses: Reinforcements needed, State content correctly Wound/Skin Impairment: Methods: Explain/Verbal Responses: Reinforcements needed, State content correctly Electronic Signature(s) Signed: 07/27/2019 5:39:41 PM By: [name] RN, BSN Entered By: [name] on 07/27/2019 10:46:18 -------------------------------------------------------------------------------- Wound Assessment Details Patient Name: Date of Service: [name] 07/27/2019 9:45 AM Medical Record [id number] Patient Account Number: [id number] Date of Birth/Sex: Treating RN: September 08, 1950 (69 y.o. [name] Primary Care Jamarion [place]: [name] Other Clinician: Referring Anushree Dorsi: Treating Octavia Mottola/Extender:Stone III, [name], FRED Weeks in Treatment: 103 Wound Status Wound Number: 61 Primary Venous Leg Ulcer Etiology: Wound Location: Left, Circumferential Lower Leg Wound Open Wounding Event: Gradually Appeared Status: Date Acquired: 04/20/2019 Comorbid Asthma, Hypertension, Peripheral Arterial Weeks Of Treatment: 14 History: Disease, Peripheral Venous Disease, Type II Clustered Wound: No Diabetes, Gout, Osteoarthritis Photos Wound Measurements Length: (cm) 19 % Reduction Width: (cm) 32 % Reduction Depth: (cm) 0.1 Epitheliali Area: (cm) 477.522 Tunneling: Volume: (cm) 47.752 Undermi Wound Description Classification: Full Thickness Without Exposed Support Foul Odo Structures Slough/F Wound Flat and  Intact Margin: Exudate Large Amount: Exudate Purulent Type: Exudate yellow, brown, green Color: Wound Bed Granulation Amount: None Present (0%) Necrotic Amount: Large (67-100%) Fascia E Necrotic Quality: Adherent Slough Fat Laye Tendon E Muscle E Joint Ex Bone Exp r After Cleansing: No ibrino Yes Exposed Structure xposed: No r (Subcutaneous Tissue) Exposed: Yes xposed: No xposed: No posed: No osed: No in Area: -6233.2% in Volume: -6233.2% zation: None No ning: No Treatment Notes Wound #61 (Left, Circumferential Lower Leg) 1. Cleanse With Wound Cleanser Soap and water 3. Primary Dressing Applied Calcium Alginate Ag 4. Secondary Dressing ABD Pad Dry Gauze 6. Support Layer Applied 3 layer compression wrap Notes netting Electronic Signature(s) Signed: 07/28/2019 4:30:47 PM By: Mikeal Hawthorne EMT/HBOT Signed: 07/29/2019 5:30:09 PM By: [name] RN, BSN Previous Signature: 07/27/2019 5:06:10 PM Version By: Deon Pilling Entered By: Mikeal Hawthorne on 07/28/2019 13:45:45 -------------------------------------------------------------------------------- Wound Assessment Details Patient Name: Date of Service: [name] 07/27/2019 9:45 AM Medical Record [id number] Patient Account Number: [id number] Date of Birth/Sex: Treating RN: 1951/01/16 (69 y.o. [name] Primary Care Chrysta Fulcher: [name] Other Clinician: Referring Ferris Fielden: Treating Bernardette Waldron/Extender:Stone III, [name], FRED Weeks in Treatment: 103 Wound Status Wound Number: 63 Primary Venous Leg Ulcer Etiology: Wound Location: Right, Medial Malleolus Wound Open Wounding Event: Gradually Appeared Status: Date Acquired: 05/17/2019 Comorbid Asthma, Hypertension, Peripheral Arterial Weeks Of Treatment: 8 History: Disease, Peripheral Venous Disease, Type II Clustered Wound: No Diabetes, Gout, Osteoarthritis Photos Wound Measurements Length: (cm) 3.5 % Reduct Width: (cm) 4  % Reduct Depth: (cm) 0.1 Epitheli Area: (cm) 10.996 Tunneli Volume: (cm) 1.1 Undermi Wound Description Full Thickness Without Exposed Support Foul Od Classification: Structures Slough/ Wound Well defined, not attached Margin: Exudate Medium Amount: Exudate Serosanguineous Type: Exudate red, brown Color: Wound Bed Granulation Amount: Large (67-100%) Granulation Quality: Pale Fascia E Necrotic Amount: Small (1-33%) Fat Laye Necrotic Quality: Adherent Slough Tendon E Muscle E Joint Ex Bone Exp or After Cleansing: No Fibrino Yes Exposed Structure xposed: No r (Subcutaneous Tissue) Exposed:  Yes xposed: No xposed: No posed: No osed: No ion in Area: -572.9% ion in Volume: -574.8% alization: Large (67-100%) ng: No ning: No Treatment Notes Wound #63 (Right, Medial Malleolus) 1. Cleanse With Wound Cleanser Soap and water 3. Primary Dressing Applied Calcium Alginate Ag 4. Secondary Dressing ABD Pad Dry Gauze 6. Support Layer Applied 3 layer compression wrap Notes netting Electronic Signature(s) Signed: 07/28/2019 4:30:47 PM By: Mikeal Hawthorne EMT/HBOT Signed: 07/29/2019 5:30:09 PM By: [name] RN, BSN Previous Signature: 07/27/2019 5:06:10 PM Version By: Deon Pilling Entered By: Mikeal Hawthorne on 07/28/2019 13:42:38 -------------------------------------------------------------------------------- Wound Assessment Details Patient Name: Date of Service: [name] 07/27/2019 9:45 AM Medical Record [id number] Patient Account Number: [id number] Date of Birth/Sex: Treating RN: 06-27-1950 (69 y.o. [name] Primary Care Erick Oxendine: [name] Other Clinician: Referring Yulonda Wheeling: Treating Shanika Levings/Extender:Stone III, [name], FRED Weeks in Treatment: 103 Wound Status Wound Number: 64 Primary Diabetic Wound/Ulcer of the Lower Extremity Etiology: Wound Location: Left Foot - Dorsal Wound Open Wounding Event: Gradually  Appeared Status: Date Acquired: 06/01/2019 Comorbid Asthma, Hypertension, Peripheral Arterial Weeks Of Treatment: 8 History: Disease, Peripheral Venous Disease, Type II Clustered Wound: No Diabetes, Gout, Osteoarthritis Photos Wound Measurements Length: (cm) 12 % Reductio Width: (cm) 8 % Reductio Depth: (cm) 0.1 Epithelial Area: (cm) 75.398 Tunneling Volume: (cm) 7.54 Undermini Wound Description Classification: Grade 1 Wound Margin: Flat and Intact Exudate Amount: Medium Exudate Type: Purulent Exudate Color: yellow, brown, green Wound Bed Granulation Amount: None Present (0%) Necrotic Amount: Large (67-100%) Necrotic Quality: Adherent Slough Foul Odor After Cleansing: No Slough/Fibrino Yes Exposed Structure Fascia Exposed: No Fat Layer (Subcutaneous Tissue) Exposed: Yes Tendon Exposed: No Muscle Exposed: No Joint Exposed: No Bone Exposed: No n in Area: -45595.8% n in Volume: -15287.8% ization: None : No ng: No Treatment Notes Wound #64 (Left, Dorsal Foot) 1. Cleanse With Wound Cleanser Soap and water 3. Primary Dressing Applied Calcium Alginate Ag 4. Secondary Dressing ABD Pad Dry Gauze 6. Support Layer Applied 3 layer compression wrap Notes netting Electronic Signature(s) Signed: 07/28/2019 4:30:47 PM By: Mikeal Hawthorne EMT/HBOT Signed: 07/29/2019 5:30:09 PM By: [name] RN, BSN Previous Signature: 07/27/2019 5:06:10 PM Version By: Deon Pilling Entered By: Mikeal Hawthorne on 07/28/2019 13:44:32 -------------------------------------------------------------------------------- Vitals Details Patient Name: Date of Service: [name] 07/27/2019 9:45 AM Medical Record [id number] Patient Account Number: [id number] Date of Birth/Sex: Treating RN: [birth year] (69 y.o. [name] Primary Care Leeta Grimme: [name] Other Clinician: Referring Jamare Vanatta: Treating Makoto Sellitto/Extender:Stone III, [name], FRED Weeks in Treatment:  103 Vital Signs Time Taken: 10:09 Temperature (F): 98.5 Height (in): 62 Pulse (bpm): 104 Weight (lbs): 335 Respiratory Rate (breaths/min): 18 Body Mass Index (BMI): 61.3 Blood Pressure (mmHg): 156/83 Reference Range: 80 - 120 mg / dl Electronic Signature(s) Signed: 07/27/2019 4:53:37 PM By: Carlene Coria RN Entered By: Carlene Coria on 07/27/2019 10:09:18

## 2019-07-27 NOTE — Progress Notes (Addendum)
[name], [name] ([id number]) Visit Report for 07/27/2019 Chief Complaint Document Details Patient Name: Date of Service: [name], [name] 07/27/2019 9:45 AM Medical Record [id number] Patient Account Number: [id number] Date of Birth/Sex: Treating RN: 1951-03-04 (69 y.o. [name] Primary Care Provider: [name] Other Clinician: Referring Provider: Treating Provider/Extender:Stone III, [name], FRED Weeks in Treatment: [phone] Information Obtained from: Patient Chief Complaint Bilateral reoccurring LE ulcers Electronic Signature(s) Signed: 07/27/2019 10:20:24 AM By: [name] PA-C Entered By: [name] on 07/27/2019 10:20:24 -------------------------------------------------------------------------------- HPI Details Patient Name: Date of Service: [name]. 07/27/2019 9:45 AM Medical Record [id number] Patient Account Number: [id number] Date of Birth/Sex: Treating RN: January 01, 1951 (68 y.o. [name] Primary Care Provider: [name] Other Clinician: Referring Provider: Treating Provider/Extender:Stone III, [name], FRED Weeks in Treatment: 103 History of Present Illness HPI Description: this patient has been seen a couple of times before and returns with recurrent problems to her right and left lower extremity with swelling and weeping ulcerations due to not wearing her compression stockings which she had been advised to do during her last discharge, at the end of June 2018. During her last visit the patient had had normal arterial blood flow and her venous reflux study did not necessitate any surgical intervention. She was recommended compression and elevation and wound care. After prolonged treatment the patient was completely healed but she has been noncompliant with wearing or compressions.. She was here last week with an outpatient return visit planned but the patient came in a very poor general condition with altered  mental status and was rushed to the ER on my request. With a history of hypertension, diabetes, TIA and right-sided weakness she was set up for an MRI on her brain and cervical spine and was sent to [facility]. Getting an MRI done was very difficult but once the workup was done she was found not to have any spinal stenosis, epidural abscess or hematoma or discitis. This was radiculopathy to be treated as an outpatient and she was given a follow-up appointment. Today she is feeling much better alert and oriented and has come to reevaluate her bilateral lower extremity lymphedema and ulceration 03/25/2017 -- she was admitted to the hospital on 03/16/2017 and discharged on 03/18/2017 with left leg cellulitis and ulceration. She was started on vancomycin and Zosyn and x-ray showed no bony involvement. She was treated for a cellulitis with IV antibiotics changed to Rocephin and Flagyl and was discharged on oral Keflex and doxycycline to complete a 7 day course. Last hemoglobin A1c was 7.1 and her other ailments including hypertension got asthma were appropriately treated. 05/06/2017 -- she is awaiting the right size of compression stockings from [place] but other than that has been doing well. ====== Old notes 69 year old patient was seen one time last October and was lost to follow-up. She has recurrent problems with weeping and ulceration of her left lower extremity and has swelling of this for several years. It has been worse for the last 2 months. Past medical history is significant for diabetes mellitus type 2, hypertension, gout, morbid obesity, depressive disorders, hiatal hernia, migraines, status post knee surgery, risk of a cholecystectomy, vaginal hysterectomy and breast biopsy. She is not a smoker. As noted before she has never had a venous duplex study and an arterial ABI study was attempted but the left lower extremity was noncompressible 10/01/2016 -- had a lower extremity venous  duplex reflux evaluation which showed no evidence of deep vein reflux  in the right or left lower extremity, and no evidence of great saphenous vein reflux more than 500 ms in the right or left lower extremity, and the left small saphenous vein is incompetent but no vascular consult was recommended. review of her electronic medical records noted that the ABI was checked in July 2017 where the right ABI was normal limits and the left ABI could not be ascertained due to pain with cuff pressure but the waveforms are within normal limits. her arterial duplex study scheduled for April 27. 10/08/2016 -- the patient has various reasons for not having a compression on and for the last 3 days she has had no compression on her left lower extremity either due to pain or the lack of nursing help. She does not use her juxta lites either. 10/15/2016 -- the patient did not keep her appointment for arterial duplex study on April 27 and I have asked her to reschedule this. Her pain is out of proportion with the physical findings and she continuously fails to wear a compression wraps and cuts them off because she says she cannot tolerate the pain. She does not use her juxta lites either. 10/22/2016 -- he has rescheduled her arterial duplex study to May 21 and her pain today is a bit better. She has not been wearing her juxta lites on her right lower extremity but now understands that she needs to do this. She did tolerate the to press compression wrap on her left lower extremity 10/29/2016 --arterial duplex study is scheduled for next week and overall she has been tolerating her compression wraps and also using her juxta lites on her right lower extremity 11/05/2016 -- the right ABI was 0.95 the left was 1.03. The digit TBI is on the right was 0.83 on the left was 0.92 and she had biphasic flow through these vessels. The impression was that of normal lower extremity arterial study. 11/12/2016 -- her pain is  minimal and she is doing very well overall. 11/26/2016 -- she has got juxta lites and her insurance will not pay for additional dual layer compression stockings. She is going to order some from [place] [facility]. 05/12/2017 -- her juxta lites are very old and too big for her and these have not been helping with compression. She did get 20-30 mm compression stockings from [place] but she and her husband are unable to put these on. I believe she will benefit from bilateral Extremit-ease, compression stockings and we will measure her for these today. 05/20/2017 -- lymphedema on the left lower extremity has increased a lot and she has a open ulceration as a result of this. The right lower extremity is looking pretty good. She has decided to by the compression stockings herself and will get reimbursed by the home health, at a later date. 05/27/2017 -- her sciatica is bothering her a lot and she thought her left leg pain was caused due to the compression wrap and hence removed it and has significant lymphedema. There is no inflammation on this left lower extremity. 06/17/17 on evaluation today patient appears to be doing very well and in fact is completely healed in regard to her ulcerations. Unfortunately however she does have continued issues with lymphedema nonetheless. We did order compression garments for her unfortunately she states that the size that she received were large although we ordered medium. Obviously this means she is not getting the optimal compression. She does not have those with her today and therefore we could not confirm and contact  the company on her behalf. Nonetheless she does state that she is going to have her husband bring them by tomorrow so that we can verify and then get in touch with the company. No fevers, chills, nausea, or vomiting noted at this time. Overall patient is doing better otherwise and I'm pleased with the progress she has made. 07/01/17 on evaluation today patient  appears to be doing very well in regard to her bilateral lower extremity she does not have any openings at this point which is excellent news. Overall I'm pleased with how things have progressed up to this time. Since she is doing so well we did order her compression which we are seeing her today to ensure that it fits her properly and everything is doing well in that regard and then subsequently she will be discharged. ============ Old Notes: 03/31/16 patient presents today for evaluation concerning open wounds that she has over the left medial ankle region as well as the left dorsal foot. She has previously had this occur although it has been healed for a number of months after having this for about a year prior until her hospitalization on 01/05/16. At that point in time it appears that she was admitted to the hospital for left lower extremity cellulitis and was placed on vancomycin and Zosyn at that point. Eventually upon discharge on January 15, 2016 she was placed on doxycycline at that point in time. Later on 03/27/16 positive wound culture growing Escherichia coli this was switched to amoxicillin. Currently she tells me that she is having pain radiated to be a 7 out of 10 which can be as high as 10 out of 10 with palpation and manipulation of the wound. This wound appears to be mainly venous in nature due to the bilateral lower extremity venous stasis/lymphedema. This is definitely much worse on her left than the right side. She does have type 1 diabetes mellitus, hypertension, morbid obesity, and is wheelchair dependent.during the course of the hospital stay a blood culture was also obtained and fortunately appeared negative. She also had an x-ray of the tibia/fibula on the left which showed no acute bone abnormality. Her white blood cell count which was performed last on 03/25/16 was 7.3, hemoglobin 12.8, protein 7.1, albumin 3.0. Her urine culture appeared to be negative for any specific  organisms. Patient did have a left lower extremity venous duplex evaluation for DVT. This did not include venous reflux studies but fortunately was negative for DVT. Patient also had arterial studies performed which revealed that she had a normal ABI on the right though this was unable to be performed on the left secondary to pain that she was having around the ankle region due to the wound. However it was stated on report that she had biphasic pulses and apparently good blood flow. ========== 06/03/17 she is here in follow-up evaluation for right lower extremity ulcer. The right lower sure he has healed but she has reopened to the left medial malleolus and dorsal foot with weeping. She is waiting for new compression garments to arrive from home health, the previous compression garments were ill fitting. We will continue with compression bilaterally and follow-up in 2 weeks Readmission: 08/05/17 on evaluation today patient appears to be doing somewhat poorly in regard to her left lower extremity especially although the right lower extremity has a small area which may no longer be open. She has been having a lot of drainage from the left lower extremity however he tells me that  she has not been able to use the EXTREMIT- EASE Compression at this point. She states that she did better and was able to actually apply the Juxta-Lite compression although the wound that she has is too large and therefore really does not compress which is why she cannot wear it at this point. She has no one who can help her put it on regular basis her son can sometimes but he's not able to do it most of the time. I do believe that's why she has begun to weave and have issues as she is currently yet again. No fevers, chills, nausea, or vomiting noted at this time. Patient is no evidence of dementia. 08/12/17 on evaluation today patient appears to still be doing fairly well in regard to the draining areas/weeping areas at  this point. With that being said she unfortunately did go to the ER yesterday due to what was felt to be possibly a cellulitis. They place her on doxycycline by mouth and discharge her home. She definitely was not admitted. With that being said she states she has had more discomfort which has been unusual for her even compared to prior times and she's had infections.08/12/17 on evaluation today patient appears to still be doing fairly well in regard to the draining areas/weeping areas at this point. With that being said she unfortunately did go to the ER yesterday due to what was felt to be possibly a cellulitis. They place her on doxycycline by mouth and discharge her home. She definitely was not admitted. With that being said she states she has had more discomfort which has been unusual for her even compared to prior times and she's had infections. 08/19/17 put evaluation today patient tells me that she's been having a lot of what sounds to be neuropathic type pain in regard to her left lower extremity. She has been using over-the-counter topical bins again which some believe. That in order to apply the she actually remove the wrap we put on her last Wednesday on Thursday. Subsequently she has not had anything on compression wise since that time. The good news is a lot of the weeping areas appear to have closed at this point again I believe she would do better with compression but we are struggling to get her to actually use what she needs to at this point. No fevers, chills, nausea, or vomiting noted at this time. 09/03/17 on evaluation today patient appears to be doing okay in regard to her lower extremities in regard to the lymphedema and weeping. Fortunately she does not seem to show any signs of infection at this point she does have a little bit of weeping occurring in the right medial malleolus area. With that being said this does not appear to be too significant which is good news. 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 She has severe skin damage secondary to both of these features involving the dorsal left foot and medial left ankle and lower leg. Still has open areas in the left anterior foot. The area on the right closed over. She uses her own juxta light stockings. She does not have an arterial issue 09/16/17 on evaluation today patient actually appears to be doing excellent in regard to her bilateral lower extremity swelling. The Juxta-Lite compression wrap seem to be doing very well for her. She has not however been using the portion that goes over her foot. Her left foot still is draining a  little bit not nearly as significant as it has been in the past but still I do believe that she likely needs to utilize the full wrap including the foot portion of this will improve as well. She also has been apparently putting on a significant amount of Vaseline which also think is not helpful for her. I recommended that if she feels she needs something for moisturizer Eucerin will probably be better. 09/30/17 on evaluation today patient presents with several new open areas in regard to her left lower extremity although these appear to be minimal and mainly seem to be more moisture breakdown than anything. Fortunately she does not seem to have any evidence of infection which is great news. She has been tolerating the dressing changes without complication we are using silver alginate on the foot she has been using AB pads to have the legs and using her Juxta-Lite compression which seems to be controlling her swelling very well. Overall I'm pleased with the poor way she has progressed. 10/14/17 on evaluation today patient appears to be doing better in regard to her left lower extremity areas of weeping. She does still have some discomfort although in general this does not appear to be as macerated and I think it  is progressing nicely. I do think she still needs to wear the foot portion of her Juxta-Lite in order to get the most benefit from the wrap obviously. She states she understands. Fortunately there does not appear to be evidence of infection at this time which is great news. 10/28/17 on evaluation today patient appears to be doing excellent in regard to her left lower extremity. She has just a couple areas that are still open and seem to be causing any trouble whatsoever. For that reason I think that she is definitely headed in the right direction the spots are very tiny compared to what we have been dealing with in the past. 11/11/17 on evaluation today patient appears to have a right lateral lower extremity ulcer that has opened since I last saw her. She states this is where the home health nurse that was coming out remove the dressing without wetting the alginate first. Nonetheless I do not know if this is indeed the case or not but more importantly we have not ordered home help to be coming out for her wounds at all. I'm unsure as to why they are coming out and we're gonna have to check on this and get things situated in that regard. With that being said we currently really do not need them to be coming out as the patient has been taking care of her leg herself without complication and no issues. In fact she was doing much better prior to nursing coming out. 11/25/17 on evaluation today patient actually appears to be doing fairly well in regard to her left lower extremity swelling. In fact she has very little area of weeping at this point there's just a small spot on the lateral portion of her right leg that still has me just a little bit more concerned as far as wanting to see this clear up before I discharge her to caring for this at home. Nonetheless overall she has made excellent progress. 12/09/17 on evaluation today patient appears to be doing rather well in regard to her lower extremity  edema. She does have some weeping still in the left lower extremity although the big area we were taking care of two weeks ago actually has closed and she has another  area of weeping on the left lower extremity immediately as well is the top of her foot. She does not currently have lymphedema pumps she has been wearing her compression daily on a regular basis as directed. With that being said I think she may benefit from lymphedema pumps. 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 We had a very difficult time getting and keeping this under control. 12/23/17 on evaluation today patient actually appears to be doing a little bit more poorly in regard to her bilateral lower extremities. She has been tolerating the Juxta-Lite compression wraps. Unfortunately she has two new ulcers on the right lower extremity and left lower Trinity ulceration seems to be larger. Obviously this is not good news. She has been tolerating the dressings without complication. 12/30/17 on evaluation today patient actually appears to be doing much better in regard to her bilateral lower extremity edema. She continues to have some issues with ulcerations and in fact there appears to be one spot on each leg where the wrap may have caused a little bit of a blister which is subsequently opened up at this point is given her pain. Fortunately it does not appear to be any evidence of infection which is good news. No fevers chills noted. 01/13/18 on evaluation today patient appears to be doing rather well in regard to her bilateral lower extremities. The dressings did get kind of stuck as far as the wound beds are concerned but again I think this is mainly due to the fact that she actually seems to be showing signs of healing which is good news. She's not having as much drainage  therefore she was having more of the dressing sticking. Nonetheless overall I feel like her swelling is dramatically down compared to previous. 01/20/18 on evaluation today patient unfortunately though she's doing better in most regards has a large blister on the left anterior lower extremity where she is draining quite significantly. Subsequently this is going to need debridement today in order to see what's underneath and ensure she does not continue to trapping fluid at this location. Nonetheless No fevers, chills, nausea, or vomiting noted at this time. 01/27/18 on evaluation today patient appears to be doing rather well at this point in regard to her right lower extremity there's just a very small area that she still has open at this point. With that being said I do believe that she is tolerating the compression wraps very well in making good progress. Home health is coming out at this point to see her. Her left lower extremity on the lateral portion is actually what still mainly open and causing her some discomfort for the most part 02/10/18 on evaluation today patient actually appears to be doing very well in regard to her right lower extremity were all the ulcers appear to be completely close. In regard to the left lower extremity she does have two areas still open and some leaking from the dorsal surface of her foot but this still seems to be doing much better to me in general. 02/24/18 on evaluation today patient actually appears to be doing much better in regard to her right lower extremity this is still completely healed. Her left lower extremity is also doing much better fortunately she has no evidence of infection. The one area that is gonna require some debridement is still  on the left anterior shin. Fortunately this is not hurting her as badly today. 03/10/18 on evaluation today patient appears to be doing better in some regards although she has a little bit more open area on the dorsal  foot and she also has some issues on the medial portion of the left lower extremity which is actually new and somewhat deep. With that being said there fortunately does not appear to be any significant signs of infection which is good news. No fevers, chills, nausea, or vomiting noted at this time. In general her swelling seems to be doing fairly well which is good news. 03/31/18 on evaluation today patient presents for follow-up concerning her left lower extremity lymphedema. Unfortunately she has been doing a little bit more poorly since I last saw her in regard to the amount of weeping that she is experiencing. She's also having some increased pain in the anterior shin location. Unfortunately I do not feel like the patient is making such good progress at this point a few weeks back she was definitely doing much better. 04/07/18 on evaluation today patient actually appears to be showing some signs of improvement as far as the left lower extremity is concerned. She has been tolerating the dressing changes and it does appear that the Drawtex did better for her. With that being said unfortunately home health is stating that they cannot obtain the Drawtex going forward. Nonetheless we're gonna have to check and see what they may be able to get the alginate they were using was getting stuck in causing new areas of skin being pulled all that with and subsequently weep and calls her to worsen overall this is the first time we've seen improvement at this time. 04/14/18 on evaluation today patient actually appears to be doing rather well at this point there does not appear to be any evidence of infection at this time and she is actually doing excellent in regard to the weeping in fact she almost has no openings remaining even compared to just last week this is a dramatic improvement. No fevers chills noted 04/21/18 evaluation today patient actually appears to be doing very well. She in fact is has a small  area on the posterior lower extremity location and she has a small area on the dorsal surface of her foot that are still open both of which are very close to closing. We're hoping this will be close shortly. She brought her Juxta-Lite wrap with her today hoping that would be able to put her in it unfortunately I don't think were quite at that point yet but we're getting closer. 04/28/18 upon evaluation today patient actually appears to be doing excellent in regard to her left lower extremity ulcer. In fact the region on the posterior lower extremity actually is much smaller than previously noted. Overall I'm very happy with the progress she has made. She again did bring her Juxta-Lite although we're not quite ready for that yet. 05/11/18 upon evaluation today patient actually appears to be doing in general fairly well in regard to her left lower Trinity. The swelling is very well controlled. With that being said she has a new area on the left anterior lower extremity as well as between the first and second toes of her left foot that was not present during the last evaluation. The region of her posterior left lower extremity actually appears to be almost completely healed. To be honest I'm very pleased with the way that stands. Nonetheless I do believe  that the lotion may be keeping the area to moist as far as her legs are concerned subsequently I'm gonna consider discontinuing that today. 05/26/18 on evaluation today patient appears to be doing rather well in regard to her left lower should be ulcers. In fact everything appears to be close except for a very small area on the left posterior lower extremity. Fortunately there does not appear to be any evidence of infection at this time. Overall very pleased with her progress. 06/02/18 and evaluation today patient actually appears to be doing very well in regard to her lower extremity ulcers. She has one small area that still continues to weep that I  think may benefit her being able to justify lotion and user Juxta-Lite wraps versus continued to wrap her. Nonetheless I think this is something we can definitely look into at this point. 06/23/18 on evaluation today patient unfortunately has openings of her bilateral lower extremities. In general she seems to be doing much worse than when I last saw her just as far as her overall health standpoint is concerned. She states that her discomfort is mainly due to neuropathy she's not having any other issues otherwise. No fevers, chills, nausea, or vomiting noted at this time. 06/30/18 on evaluation today patient actually appears to be doing a little worse in regard to her right lower extremity her left lower extremity of doing fairly well. Fortunately there is no sign of infection at this time. She has been tolerating the dressing changes without complication. Home health did not come out like they were supposed to for the appropriate wrap changes. They stated that they never received the orders from [place] which were fax. Nonetheless we will send a copy of the orders with the patient today as well. 07/07/18 on evaluation today patient appears to be doing much better in regard to lower extremities. She still has several openings bilaterally although since I last saw her her legs did show obvious signs of infection when she later saw her nurse. Subsequently a culture was obtained and she is been placed on Bactrim and Keflex. Fortunately things seem to be looking much better it does appear she likely had an infection. Again last week we'd even discussed it but again there really was not any obvious sign that she had infection therefore we held off on the antibiotics. Nonetheless I'm glad she's doing better today. 07/14/18 on evaluation today patient appears to be doing much better regarding her bilateral lower Trinity's. In fact on the right lower for me there's nothing open at this point there are some dry skin  areas at the sites where she had infection. Fortunately there is no evidence of systemic infection which is excellent news. No fevers chills noted 07/21/18 on evaluation today patient actually appears to be doing much better in regard to her left lower extremity ulcers. She is making good progress and overall I feel like she's improving each time I see her. She's having no pain I do feel like the infection is completely resolved which is excellent news. No fevers, chills, nausea, or vomiting noted at this time. 07/28/18 on evaluation today patient appears to be doing very well in regard to her left lower [employer]. Everything seems to be showing signs of improvement which is excellent news. Overall very pleased with the progress that has been made. Fortunately there's no evidence of active infection at this time also excellent news. 08/04/18 on evaluation today patient appears to be doing more poorly in regard  to her bilateral lower extremities. She has two new areas open up on the right and these were completely closed as of last week. She still has the two spots on the left which in my pinion seem to be doing better. Fortunately there's no evidence of infection again at this point. 08/11/18 on evaluation today patient actually appears to be doing very well in regard to her bilateral lower Trinity wounds that all seem to be doing better and are measures smaller today. Fortunately there's no signs of infection. No fevers, chills, nausea, or vomiting noted at this time. 08/18/18 on evaluation today patient actually appears to be doing about the same inverter bilateral lower extremities. She continues to have areas that blistering open as was drain that fortunately nothing too significant. Overall I feel like Drawtex may have done better for her however compared to the collagen. 08/25/18 on evaluation today patient appears to be doing a little bit more poorly today even compared to last time I saw  her. Again I'm not exactly sure why she's making worse progress over the past several weeks. I'm beginning to wonder if there is some kind of underlying low level infection causing this issue. I did actually take a culture from the left anterior lower extremity but it was a new wound draining quite a bit at this point. Unfortunately she also seems to be having more pain which is what also makes me worried about the possibility of infection. This is despite never erythema noted at this point. 09/01/18 on evaluation today patient actually appears to be doing a little worse even compared to last week in regard to bilateral lower extremities. She did go to the hospital on the 16th was given a dose of IV Zosyn and then discharged with a recommendation to continue with the Bactrim that I previously prescribed for her. Nonetheless she is still having a lot of discomfort she tells me as well at this time. This is definitely unfortunate. No fevers, chills, nausea, or vomiting noted at this time. 09/08/18 on evaluation today patient's bilateral lower extremities actually appear to be shown signs of improvement which is good news. Fortunately there does not appear to be any signs of active infection I think the anabiotic is helping in this regard. Overall I'm very pleased with how she is progressing. 09/15/18 patient was actually seen in ER yesterday due to her legs as well unfortunately. She states she's been having a lot of pain and discomfort as well as a lot of drainage. Upon inspection today the patient does have a lot of swelling and drainage I feel like this is more related to lymphedema and poor fluid control than it is to infection based on what I'm seeing. The physician in the emergency department also doubted that the patient was having a significant infection nonetheless I see no evidence of infection obvious at this point although I do see evidence of poor fluid control. She still not using a  compression pumps, she is not elevating due to her lift chair as well as her hospital bed being broken, and she really is not keeping her legs up as much as they should be and also has been taking off her wraps. All this combined I think has led to poor fluid control and to be honest she may be somewhat volume overloaded in general as well. I recommend that she may need to contact your physician to see if a prescription for a diuretic would be beneficial in their opinion.  As long as this is safe I think it would likely help her. 09/29/18 on evaluation today patient's left lower extremity actually appears to be doing quite a bit better. At least compared to last time that I saw her. She still has a large area where she is draining from but there's a lot of new skin speckled trout and in fact there's more new skin that there are open areas of weeping and drainage at this point. This is good news. With regard to the right lower extremity this is doing much better with the only open area that I really see being a dry spot on the right lateral ankle currently. Fortunately there's no signs of active infection at this time which is good news. No fevers, chills, nausea, or vomiting noted at this time. The patient seems somewhat stressed and overwhelmed during the visit today she was very lethargic as such. She does and she is not taking any pain medications at this point. Apparently according to her husband are also in the process of moving which is probably taking its toll on her as well. 10/06/18 on evaluation today patient appears to be doing rather well in regard to her lower extremities compared to last evaluation. Fortunately there's no signs of active infection. She tells me she did have an appointment with her primary. Nonetheless he was concerned that the wounds were somewhat deep based on pictures but we never actually saw her legs. She states that he had her somewhat worried due to the fact that  she was fearing now that she was [place] have to have an amputation. With that being said based on what I'm seeing check she looks better this week that she has the last two times I've seen her with much less drainage I'm actually pleased in this regard. That doesn't mean that she's out of the water but again I do not think what the point of talking about education at all in regard to her leg. She is very happy to hear this. She is also not having as much pain as she was having last week. 10/13/18 unfortunately on evaluation today patient still continues to have a significant amount of drainage she's not letting home health actually apply the compression dressings at this point. She's trying to use of Juxta-Lite of the top of Kerlex and the second layer of the three layer compression wrap. With that being said she just does not seem to be making as good a progress as I would expect if she was having the compression applied and in place on a regular basis. No fevers, chills, nausea, or vomiting noted at this time. 10/20/18 on evaluation today patient appears to be doing a little better in regard to her bilateral lower extremity ulcers. In fact the right lower extremity seems to be healed she doesn't even have any openings at this point left lower extremity though still somewhat macerated seems to be showing signs of new skin growth at multiple locations throughout. Fortunately there's no evidence of active infection at this time. No fevers, chills, nausea, or vomiting noted at this time. 10/27/18 on evaluation today patient appears to be doing much better in regard to her left lower Trinity ulcer. She's been tolerating the laptop complication and has minimal drainage noted at this point. Fortunately there's no signs of active infection at this time. No fevers, chills, nausea, or vomiting noted at this time. 11/03/18 on evaluation today patient actually appears to be doing excellent in regard to  her left  lower extremity. She is having very little drainage at this point there does not appear to be any significant signs of infection overall very pleased with how things have gone. She is likewise extremely pleased still and seems to be making wonderful progress week to week. I do believe antibiotics were helpful for her. Her primary care provider did place on amateur clean since I last saw her. 11/17/18 on evaluation today patient appears to be doing worse in regard to her bilateral lower extremities at this point. She is been tolerating the dressing changes without complication. With that being said she typically takes the Coban off fairly quickly upon arriving home even after being seen here in the clinic and does not allow home health reapply command as part of the dressing at home. Therefore she said no compression essentially since I last saw her as best I can tell. With that being said I think it shows and how much swelling she has in the open wounds that are noted at this point. Fortunately there's no signs of infection but unfortunately if she doesn't get this under control I think she will end up with infection and more significant issues. 11/24/18 on evaluation today patient actually appears to be doing somewhat better in regard to her bilateral lower extremities. 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 Nonetheless that is doing better currently so she should be able to attempt reinitiating the lymphedema pumps at this time. No fevers, chills, nausea, or vomiting noted at this time. 12/01/18 upon evaluation today patient's left lower extremity appears to be doing quite well unfortunately her right lower extremity is not doing nearly as well. She has been tolerating the dressing changes without complication unfortunately she did not keep a wrap on the  right at this time. Nonetheless I believe this has led to increased swelling and weeping in the world is actually much larger than during the last evaluation with her. 12/08/18 on evaluation today patient appears to be doing about the same at this point in regard to her right lower extremity. There is some more palatable to touch I'm concerned about the possibility of there being some infection although I think the main issue is she's not keeping her compression wrap on which in turn is not allowing this area to heal appropriately. 12/22/18 on evaluation today patient appears to be doing better in regard to left lower extremity unfortunately significantly worse in regard to the right lower extremity. The areas of blistering and necrotic superficial tissue have spread and again this does not really appear to be signs of infection and all she just doesn't seem to be doing nearly as well is what she has been in the past. Overall I feel like the Augmentin did absolutely nothing for her she doesn't seem to have any infection again I really didn't think so last time either is more of a potential preventative measure and hoping that this would make some difference but I think the main issue is she's not wearing her compression. She tells me she cannot wear the [place] been we put on she takes it off pretty much upon getting home. Subsequently she worshiped Juxta-Lite when I questioned her about how often she wears it this is no more than three hours a day obviously that leaves 21  hours that she has no compression and this is obviously not doing well for her. Overall I'm concerned that if things continue to worsen she is at great risk of both infection as well as losing her leg. 01/05/19 on evaluation today patient appears to be doing well in regard to her left lower extremity which he is allowing [place] to wrap and not so well with regard to her right lower extremity which she is not allowing [place] to really wrap  and keep the wrap on. She states that it hurts too badly whenever it's wrapped and she ends up having to take it off. She's been using the Juxta-Lite she tells me up to six hours a day although I question whether or not that's really been the case to be honest. Previously she told me three hours today nonetheless obviously the legs as long as the wrap is doing great when she is not is doing much more poorly. 01/12/2019 on evaluation today patient actually appears to be doing a little better in my opinion with regard to her right lower extremity ulcer. She has a small open area on the left lower extremity unfortunately but again this I think is part of the normal fluctuation of what she is going to have to expect with regard to her legs especially when she is not using her lymphedema pumps on a regular basis. Subsequently based on what I am seeing today I think that she does seem to be doing slightly better with regard to her right lower extremity she did see her primary care provider on Monday they felt she had an infection and placed her on 2 antibiotics. Both Cipro and clindamycin. Subsequently again she seems possibly to be doing a little bit better in regards to the right lower extremity she also tells me however she has been wearing the compression wrap over the past week since I spoke with her as well that is a Kerlix and Coban wrap on the right. No fevers, chills, nausea, vomiting, or diarrhea. 01/19/2019 on evaluation today patient appears to be doing better with regard to her bilateral lower extremities especially the right. I feel like the compression has been beneficial for her which is great news. She did get a call from her primary care provider on her way here today telling her that she did have methicillin-resistant Staphylococcus aureus and he was calling in a couple new antibiotics for her including a ointment to be applied she tells me 3 times a day. With that being said this sounds  like likely to be Bactroban which I think could be applied with each dressing/wrap change but I would not be able to accommodate her applying this 3 times a day. She is in agreement with the least doing this we will add that to her orders today. 01/26/2019 on evaluation today patient actually appears to be doing much better with regard to her right lower extremity. Her left lower extremity is also doing quite well all things considering. Fortunately there is no evidence of active infection at this time. No fevers, chills, nausea, vomiting, or diarrhea. 02/02/2019 on evaluation today patient appears to be doing much better compared to her last evaluation. Little by little off like her right leg is returning more towards normal. There does not appear to be any signs of active infection and overall she seems to be doing quite well which is great news. I am very pleased in this regard. No fevers, chills, nausea, vomiting, or diarrhea. 02/09/2019  upon evaluation today patient appears to be doing better with regard to her bilateral lower extremities. She has been tolerating the dressing changes without complication. Fortunately there is no signs of active infection at this time. No fevers, chills, nausea, vomiting, or diarrhea. 02/23/2019 on evaluation today patient actually appears to be doing quite well with regard to her bilateral lower extremities. She has been tolerating the dressing changes without complication. She is even used her pumps one time and states that she really felt like it felt good. With that being said she seems to be in good spirits and her legs appear to be doing excellent. 03/09/2019 on evaluation today patient appears to be doing well with regard to her right lower extremity there are no open wounds at this time she is having some discomfort but I feel like this is more neuropathy than anything. With regard to her left lower extremity she had several areas scattered around that she  does have some weeping and drainage from but again overall she does not appear to be having any significant issues and no evidence of infection at this time which is good news. 03/23/2019 on evaluation today patient appears to be doing well with regard to her right lower extremity which she tells me is still close she is using her juxta light here. Her left lower extremity she mainly just has an area on the foot which is still slightly draining although this also is doing great. Overall very pleased at this time. 04/06/2019 patient appears to be doing a little bit worse in regard to her left lower extremity upon evaluation today. She feels like this could be becoming infected again which she had issues with previous. Fortunately there is no signs of systemic infection but again this is always a struggle with her with her legs she will go from doing well to not so well in a very short amount of time. 04/20/2019 on evaluation today patient actually appears to be doing quite well with regard to her right lower extremity I do not see any signs of active infection at this time. Fortunately there is no fever chills noted. She is still taking the antibiotics which I prescribed for her at this point. In regard to the left lower extremity I do feel like some of these areas are better although again she still is having weeping from several locations at this time. 04/27/2019 on evaluation today patient appears to be doing about the same if not slightly worse in regard to her left lower extremity ulcers. She tells me when questioned that she has been sleeping in her Hoveround chair in fact she tells me she falls asleep without even knowing it. I think she is spending a whole lot of time in the chair and less time walking and moving around which is not good for her legs either. On top of that she is in a seated position which is also the worst position she is not really elevating her legs and she is also not  using her lymphedema pumps. All this is good to contribute to worsening of her condition in general. 05/18/2019 on evaluation today patient appears to be doing well with regard to her lower extremity on the right in fact this is showing no signs of any open wounds at this time. On the left she is continuing to have issues with areas that do drain. Some of the regions have healed and there are couple areas that have reopened. She did go to  the ER per the patient according to recommendations from the home health nurse due to what she was seen when she came out on 05/13/2019. Subsequently she felt like the patient needed to go to the hospital due to the fact that again she was having "milky white discharge" from her leg. Nonetheless she had and then was placed on doxycycline and subsequently seems to be doing better. 06/01/2019 upon evaluation today patient appears to be doing really in my opinion about the same. I do not see any signs of active infection which is good news. Overall she still has wounds over the bilateral lower extremities she has reopened on the right but this appears to be more of a crack where there is weeping/edema coming from the region. I do not see any evidence of infection at either site based on what I visualized today. 07/13/2019 upon evaluation today patient appears to be doing a little worse compared to last time I saw her. She since has been in the hospital from 06/21/2019 through 06/29/2019. This was secondary to having Covid. During that time they did apply lotion to her legs which unfortunately has caused her to develop a myriad of open wounds on her lower extremities. Her legs do appear to be doing better as far as the overall appearance is concerned but nonetheless she does have more open and weeping areas. 07/27/2019 upon evaluation today patient appears to be doing more poorly to be honest in regard to her left lower extremity in particular. There is no signs of  systemic infection although I do believe she may have local infection. She notes she has been having a lot of blue/green drainage which is consistent potentially with Pseudomonas. That may be something that we need to consider here as well. The doxycycline does not seem to have been helping. Electronic Signature(s) Signed: 07/27/2019 10:45:27 AM By: [name] PA-C Entered By: [name] on 07/27/2019 10:45:27 -------------------------------------------------------------------------------- Physical Exam Details Patient Name: Date of Service: [name], [name] 07/27/2019 9:45 AM Medical Record [id number] Patient Account Number: [id number] Date of Birth/Sex: Treating RN: 05-01-1951 (69 y.o. [name] Primary Care Provider: [name] Other Clinician: Referring Provider: Treating Provider/Extender:Stone III, [name], FRED Weeks in Treatment: 59 Constitutional Well-nourished and well-hydrated in no acute distress. Respiratory normal breathing without difficulty. Psychiatric this patient is able to make decisions and demonstrates good insight into disease process. Alert and Oriented x 3. pleasant and cooperative. Notes His wounds currently on the left lower extremity seem to be showing some signs of irritation there is a lot of skin that is loosening up and sloughing off. Again she does have a lot of drainage she tells me this is more thick and it does appear to be purulent she is noting blue-green coloration I see that as well. Again this can be very consistent with Pseudomonas. Obviously the doxycycline would not help if it were something as such. Electronic Signature(s) Signed: 07/27/2019 10:45:53 AM By: [name] PA-C Entered By: [name] on 07/27/2019 10:45:52 -------------------------------------------------------------------------------- Physician Orders Details Patient Name: Date of Service: [name], [name] 07/27/2019 9:45 AM Medical  Record [id number] Patient Account Number: [id number] Date of Birth/Sex: Treating RN: 1950/10/20 (68 y.o. [name] Primary Care Provider: [name] Other Clinician: Referring Provider: Treating Provider/Extender:Stone III, [name], FRED Weeks in Treatment: [phone] Verbal / Phone Orders: No Diagnosis Coding ICD-10 Coding Code Description E11.622 Type 2 diabetes mellitus with other skin ulcer I89.0 Lymphedema, not elsewhere classified I87.331  Chronic venous hypertension (idiopathic) with ulcer and inflammation of right lower extremity I87.332 Chronic venous hypertension (idiopathic) with ulcer and inflammation of left lower extremity L97.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 leg with fat layer exposed L97.822 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 I10 Essential (primary) hypertension E66.01 Morbid (severe) obesity due to excess calories F41.8 Other specified anxiety disorders R53.1 Weakness Follow-up Appointments Return Appointment in 1 week. Dressing Change Frequency Change dressing three times week. - both legs Wound Cleansing Clean wound with Wound Cleanser - all wounds May shower with protection. Primary Wound Dressing Wound #61 Left,Circumferential Lower Leg Calcium Alginate with Silver Wound #63 Right,Medial Malleolus Calcium Alginate with Silver Wound #64 Left,Dorsal Foot Calcium Alginate with Silver Secondary Dressing Wound #61 Left,Circumferential Lower Leg Dry Gauze - all wounds ABD pad - as needed Zetuvit or Kerramax Wound #63 Right,Medial Malleolus Dry Gauze - all wounds ABD pad - as needed Zetuvit or Kerramax Wound #64 Left,Dorsal Foot Dry Gauze - all wounds ABD pad - as needed Zetuvit or Kerramax - as needed Edema Control 3 Layer Compression System - Bilateral Avoid standing for long  periods of time - walking is encouraged Elevate legs to the level of the heart or above for 30 minutes daily and/or when sitting, a frequency of: - do not sleep in chair with feet dangling, MUST elevate legs while sitting Exercise regularly Support Garment 20-30 mm/Hg pressure to: - Juxtalite to right lower leg if unable to tolerate wrap Segmental Compressive Device. - lymphedema pumps 60 minutes 1- 2 times per day Off-Loading Turn and reposition every 2 hours Additional Orders / Instructions Follow Nutritious Diet - To include vitamin A, vitamin C, and Zinc along with increased protein intake. [place] skilled nursing for wound care. - [facility] Bacteria identified in Unspecified specimen by Anaerobe culture (MICRO) - left lower leg LOINC Code: 177-9 Convenience Name: Anerobic culture Patient Medications Allergies: ACE Inhibitors, metformin, propofol Notifications Medication Indication Start End Levaquin 07/27/2019 DOSE 1 - oral 750 mg tablet - 1 tablet oral taken 1 time a day for 14 days. Do not take magnesium with this medication Electronic Signature(s) Signed: 07/27/2019 10:53:15 AM By: [name] PA-C Entered By: [name] on 07/27/2019 10:53:12 -------------------------------------------------------------------------------- Problem List Details Patient Name: Date of Service: [name]. 07/27/2019 9:45 AM Medical Record [id number] Patient Account Number: [id number] Date of Birth/Sex: Treating RN: Apr 08, 1951 (69 y.o. [name] Primary Care Provider: [name] Other Clinician: Referring Provider: Treating Provider/Extender:Stone III, [name], FRED Weeks in Treatment: [phone] Active Problems ICD-10 Evaluated Encounter Code Description Active Date Today Diagnosis E11.622 Type 2 diabetes mellitus with other skin ulcer 08/05/2017 No Yes I89.0 Lymphedema, not elsewhere classified 08/05/2017 No Yes I87.331  Chronic venous hypertension (idiopathic) with ulcer 08/05/2017 No Yes and inflammation of right lower extremity I87.332 Chronic venous hypertension (idiopathic) with ulcer 08/05/2017 No Yes and inflammation of left lower extremity L97.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 08/05/2017 No Yes leg with fat layer exposed L97.822 Non-pressure chronic ulcer of other part of left lower 08/05/2017 No Yes leg with fat layer exposed L97.522 Non-pressure chronic ulcer of other part of left foot 06/01/2019 No Yes with fat layer exposed I10 Essential (primary) hypertension 08/05/2017 No Yes E66.01 Morbid (severe) obesity due to excess calories 08/05/2017 No Yes F41.8 Other  specified anxiety disorders 08/05/2017 No Yes R53.1 Weakness 08/05/2017 No Yes Inactive Problems Resolved Problems Electronic Signature(s) Signed: 07/27/2019 10:20:16 AM By: [name] PA-C Entered By: [name] on 07/27/2019 10:20:15 -------------------------------------------------------------------------------- Progress Note Details Patient Name: Date of Service: [name]. 07/27/2019 9:45 AM Medical Record [id number] Patient Account Number: [id number] Date of Birth/Sex: Treating RN: [birth year] (68 y.o. [name] Primary Care Provider: [name] Other Clinician: Referring Provider: Treating Provider/Extender:Stone III, [name], FRED Weeks in Treatment: [phone] Subjective Chief Complaint Information obtained from Patient Bilateral reoccurring LE ulcers History of Present Illness (HPI) this patient has been seen a couple of times before and returns with recurrent problems to her right and left lower extremity with swelling and weeping ulcerations due to not wearing her compression stockings which she had been advised to do during her last discharge, at the end of June 2018. During her last visit the patient had had normal arterial blood flow and her venous reflux study did  not necessitate any surgical intervention. She was recommended compression and elevation and wound care. After prolonged treatment the patient was completely healed but she has been noncompliant with wearing or compressions.. She was here last week with an outpatient return visit planned but the patient came in a very poor general condition with altered mental status and was rushed to the ER on my request. With a history of hypertension, diabetes, TIA and right-sided weakness she was set up for an MRI on her brain and cervical spine and was sent to [facility]. Getting an MRI done was very difficult but once the workup was done she was found not to have any spinal stenosis, epidural abscess or hematoma or discitis. This was radiculopathy to be treated as an outpatient and she was given a follow-up appointment. Today she is feeling much better alert and oriented and has come to reevaluate her bilateral lower extremity lymphedema and ulceration 03/25/2017 -- she was admitted to the hospital on 03/16/2017 and discharged on 03/18/2017 with left leg cellulitis and ulceration. She was started on vancomycin and Zosyn and x-ray showed no bony involvement. She was treated for a cellulitis with IV antibiotics changed to Rocephin and Flagyl and was discharged on oral Keflex and doxycycline to complete a 7 day course. Last hemoglobin A1c was 7.1 and her other ailments including hypertension got asthma were appropriately treated. 05/06/2017 -- she is awaiting the right size of compression stockings from [place] but other than that has been doing well. ====== Old notes 69 year old patient was seen one time last October and was lost to follow-up. She has recurrent problems with weeping and ulceration of her left lower extremity and has swelling of this for several years. It has been worse for the last 2 months. Past medical history is significant for diabetes mellitus type 2, hypertension, gout,  morbid obesity, depressive disorders, hiatal hernia, migraines, status post knee surgery, risk of a cholecystectomy, vaginal hysterectomy and breast biopsy. She is not a smoker. As noted before she has never had a venous duplex study and an arterial ABI study was attempted but the left lower extremity was noncompressible 10/01/2016 -- had a lower extremity venous duplex reflux evaluation which showed no evidence of deep vein reflux in the right or left lower extremity, and no evidence of great saphenous vein reflux more than 500 ms in the right or left lower extremity, and the left small saphenous vein is incompetent but no vascular consult was recommended. review of her electronic  medical records noted that the ABI was checked in July 2017 where the right ABI was normal limits and the left ABI could not be ascertained due to pain with cuff pressure but the waveforms are within normal limits. her arterial duplex study scheduled for April 27. 10/08/2016 -- the patient has various reasons for not having a compression on and for the last 3 days she has had no compression on her left lower extremity either due to pain or the lack of nursing help. She does not use her juxta lites either. 10/15/2016 -- the patient did not keep her appointment for arterial duplex study on April 27 and I have asked her to reschedule this. Her pain is out of proportion with the physical findings and she continuously fails to wear a compression wraps and cuts them off because she says she cannot tolerate the pain. She does not use her juxta lites either. 10/22/2016 -- he has rescheduled her arterial duplex study to May 21 and her pain today is a bit better. She has not been wearing her juxta lites on her right lower extremity but now understands that she needs to do this. She did tolerate the to press compression wrap on her left lower extremity 10/29/2016 --arterial duplex study is scheduled for next week and overall  she has been tolerating her compression wraps and also using her juxta lites on her right lower extremity 11/05/2016 -- the right ABI was 0.95 the left was 1.03. The digit TBI is on the right was 0.83 on the left was 0.92 and she had biphasic flow through these vessels. The impression was that of normal lower extremity arterial study. 11/12/2016 -- her pain is minimal and she is doing very well overall. 11/26/2016 -- she has got juxta lites and her insurance will not pay for additional dual layer compression stockings. She is going to order some from [place]. 05/12/2017 -- her juxta lites are very old and too big for her and these have not been helping with compression. She did get 20-30 mm compression stockings from [facility] but she and her husband are unable to put these on. I believe she will benefit from bilateral Extremit-ease, compression stockings and we will measure her for these today. 05/20/2017 -- lymphedema on the left lower extremity has increased a lot and she has a open ulceration as a result of this. The right lower extremity is looking pretty good. She has decided to by the compression stockings herself and will get reimbursed by the home health, at a later date. 05/27/2017 -- her sciatica is bothering her a lot and she thought her left leg pain was caused due to the compression wrap and hence removed it and has significant lymphedema. There is no inflammation on this left lower extremity. 06/17/17 on evaluation today patient appears to be doing very well and in fact is completely healed in regard to her ulcerations. Unfortunately however she does have continued issues with lymphedema nonetheless. We did order compression garments for her unfortunately she states that the size that she received were large although we ordered medium. Obviously this means she is not getting the optimal compression. She does not have those with her today and therefore we could not confirm and  contact the company on her behalf. Nonetheless she does state that she is going to have her husband bring them by tomorrow so that we can verify and then get in touch with the company. No fevers, chills, nausea, or vomiting noted at this  time. Overall patient is doing better otherwise and I'm pleased with the progress she has made. 07/01/17 on evaluation today patient appears to be doing very well in regard to her bilateral lower extremity she does not have any openings at this point which is excellent news. Overall I'm pleased with how things have progressed up to this time. Since she is doing so well we did order her compression which we are seeing her today to ensure that it fits her properly and everything is doing well in that regard and then subsequently she will be discharged. ============ Old Notes: 03/31/16 patient presents today for evaluation concerning open wounds that she has over the left medial ankle region as well as the left dorsal foot. She has previously had this occur although it has been healed for a number of months after having this for about a year prior until her hospitalization on 01/05/16. At that point in time it appears that she was admitted to the hospital for left lower extremity cellulitis and was placed on vancomycin and Zosyn at that point. Eventually upon discharge on January 15, 2016 she was placed on doxycycline at that point in time. Later on 03/27/16 positive wound culture growing Escherichia coli this was switched to amoxicillin. Currently she tells me that she is having pain radiated to be a 7 out of 10 which can be as high as 10 out of 10 with palpation and manipulation of the wound. This wound appears to be mainly venous in nature due to the bilateral lower extremity venous stasis/lymphedema. This is definitely much worse on her left than the right side. She does have type 1 diabetes mellitus, hypertension, morbid obesity, and is wheelchair dependent.during  the course of the hospital stay a blood culture was also obtained and fortunately appeared negative. She also had an x-ray of the tibia/fibula on the left which showed no acute bone abnormality. Her white blood cell count which was performed last on 03/25/16 was 7.3, hemoglobin 12.8, protein 7.1, albumin 3.0. Her urine culture appeared to be negative for any specific organisms. Patient did have a left lower extremity venous duplex evaluation for DVT. This did not include venous reflux studies but fortunately was negative for DVT. Patient also had arterial studies performed which revealed that she had a normal ABI on the right though this was unable to be performed on the left secondary to pain that she was having around the ankle region due to the wound. However it was stated on report that she had biphasic pulses and apparently good blood flow. ========== 06/03/17 she is here in follow-up evaluation for right lower extremity ulcer. The right lower sure he has healed but she has reopened to the left medial malleolus and dorsal foot with weeping. She is waiting for new compression garments to arrive from home health, the previous compression garments were ill fitting. We will continue with compression bilaterally and follow-up in 2 weeks Readmission: 08/05/17 on evaluation today patient appears to be doing somewhat poorly in regard to her left lower extremity especially although the right lower extremity has a small area which may no longer be open. She has been having a lot of drainage from the left lower extremity however he tells me that she has not been able to use the EXTREMIT- EASE Compression at this point. She states that she did better and was able to actually apply the Juxta-Lite compression although the wound that she has is too large and therefore really does not compress  which is why she cannot wear it at this point. She has no one who can help her put it on regular basis her son can  sometimes but he's not able to do it most of the time. I do believe that's why she has begun to weave and have issues as she is currently yet again. No fevers, chills, nausea, or vomiting noted at this time. Patient is no evidence of dementia. 08/12/17 on evaluation today patient appears to still be doing fairly well in regard to the draining areas/weeping areas at this point. With that being said she unfortunately did go to the ER yesterday due to what was felt to be possibly a cellulitis. They place her on doxycycline by mouth and discharge her home. She definitely was not admitted. With that being said she states she has had more discomfort which has been unusual for her even compared to prior times and she's had infections.08/12/17 on evaluation today patient appears to still be doing fairly well in regard to the draining areas/weeping areas at this point. With that being said she unfortunately did go to the ER yesterday due to what was felt to be possibly a cellulitis. They place her on doxycycline by mouth and discharge her home. She definitely was not admitted. With that being said she states she has had more discomfort which has been unusual for her even compared to prior times and she's had infections. 08/19/17 put evaluation today patient tells me that she's been having a lot of what sounds to be neuropathic type pain in regard to her left lower extremity. She has been using over-the-counter topical bins again which some believe. That in order to apply the she actually remove the wrap we put on her last Wednesday on Thursday. Subsequently she has not had anything on compression wise since that time. The good news is a lot of the weeping areas appear to have closed at this point again I believe she would do better with compression but we are struggling to get her to actually use what she needs to at this point. No fevers, chills, nausea, or vomiting noted at this time. 09/03/17 on evaluation  today patient appears to be doing okay in regard to her lower extremities in regard to the lymphedema and weeping. Fortunately she does not seem to show any signs of infection at this point she does have a little bit of weeping occurring in the right medial malleolus area. With that being said this does not appear to be too significant which is good news. 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 She has severe skin damage secondary to both of these features involving the dorsal left foot and medial left ankle and lower leg. Still has open areas in the left anterior foot. The area on the right closed over. She uses her own juxta light stockings. She does not have an arterial issue 09/16/17 on evaluation today patient actually appears to be doing excellent in regard to her bilateral lower extremity swelling. The Juxta-Lite compression wrap seem to be doing very well for her. She has not however been using the portion that goes over her foot. Her left foot still is draining a little bit not nearly as significant as it has been in the past but still I do believe that she likely needs to utilize the full wrap including the foot portion of this will improve as well. She also has been apparently putting on  a significant amount of Vaseline which also think is not helpful for her. I recommended that if she feels she needs something for moisturizer Eucerin will probably be better. 09/30/17 on evaluation today patient presents with several new open areas in regard to her left lower extremity although these appear to be minimal and mainly seem to be more moisture breakdown than anything. Fortunately she does not seem to have any evidence of infection which is great news. She has been tolerating the dressing changes without complication we are using silver alginate on the foot she has been using AB pads to have the legs and using her  Juxta-Lite compression which seems to be controlling her swelling very well. Overall I'm pleased with the poor way she has progressed. 10/14/17 on evaluation today patient appears to be doing better in regard to her left lower extremity areas of weeping. She does still have some discomfort although in general this does not appear to be as macerated and I think it is progressing nicely. I do think she still needs to wear the foot portion of her Juxta-Lite in order to get the most benefit from the wrap obviously. She states she understands. Fortunately there does not appear to be evidence of infection at this time which is great news. 10/28/17 on evaluation today patient appears to be doing excellent in regard to her left lower extremity. She has just a couple areas that are still open and seem to be causing any trouble whatsoever. For that reason I think that she is definitely headed in the right direction the spots are very tiny compared to what we have been dealing with in the past. 11/11/17 on evaluation today patient appears to have a right lateral lower extremity ulcer that has opened since I last saw her. She states this is where the home health nurse that was coming out remove the dressing without wetting the alginate first. Nonetheless I do not know if this is indeed the case or not but more importantly we have not ordered home help to be coming out for her wounds at all. I'm unsure as to why they are coming out and we're gonna have to check on this and get things situated in that regard. With that being said we currently really do not need them to be coming out as the patient has been taking care of her leg herself without complication and no issues. In fact she was doing much better prior to nursing coming out. 11/25/17 on evaluation today patient actually appears to be doing fairly well in regard to her left lower extremity swelling. In fact she has very little area of weeping at this point  there's just a small spot on the lateral portion of her right leg that still has me just a little bit more concerned as far as wanting to see this clear up before I discharge her to caring for this at home. Nonetheless overall she has made excellent progress. 12/09/17 on evaluation today patient appears to be doing rather well in regard to her lower extremity edema. She does have some weeping still in the left lower extremity although the big area we were taking care of two weeks ago actually has closed and she has another area of weeping on the left lower extremity immediately as well is the top of her foot. She does not currently have lymphedema pumps she has been wearing her compression daily on a regular basis as directed. With that being said I think she  may benefit from lymphedema pumps. 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 We had a very difficult time getting and keeping this under control. 12/23/17 on evaluation today patient actually appears to be doing a little bit more poorly in regard to her bilateral lower extremities. She has been tolerating the Juxta-Lite compression wraps. Unfortunately she has two new ulcers on the right lower extremity and left lower [place] ulceration seems to be larger. Obviously this is not good news. She has been tolerating the dressings without complication. 12/30/17 on evaluation today patient actually appears to be doing much better in regard to her bilateral lower extremity edema. She continues to have some issues with ulcerations and in fact there appears to be one spot on each leg where the wrap may have caused a little bit of a blister which is subsequently opened up at this point is given her pain. Fortunately it does not appear to be any evidence of infection which is good news. No fevers  chills noted. 01/13/18 on evaluation today patient appears to be doing rather well in regard to her bilateral lower extremities. The dressings did get kind of stuck as far as the wound beds are concerned but again I think this is mainly due to the fact that she actually seems to be showing signs of healing which is good news. She's not having as much drainage therefore she was having more of the dressing sticking. Nonetheless overall I feel like her swelling is dramatically down compared to previous. 01/20/18 on evaluation today patient unfortunately though she's doing better in most regards has a large blister on the left anterior lower extremity where she is draining quite significantly. Subsequently this is going to need debridement today in order to see what's underneath and ensure she does not continue to trapping fluid at this location. Nonetheless No fevers, chills, nausea, or vomiting noted at this time. 01/27/18 on evaluation today patient appears to be doing rather well at this point in regard to her right lower extremity there's just a very small area that she still has open at this point. With that being said I do believe that she is tolerating the compression wraps very well in making good progress. Home health is coming out at this point to see her. Her left lower extremity on the lateral portion is actually what still mainly open and causing her some discomfort for the most part 02/10/18 on evaluation today patient actually appears to be doing very well in regard to her right lower extremity were all the ulcers appear to be completely close. In regard to the left lower extremity she does have two areas still open and some leaking from the dorsal surface of her foot but this still seems to be doing much better to me in general. 02/24/18 on evaluation today patient actually appears to be doing much better in regard to her right lower extremity this is still completely healed. Her left  lower extremity is also doing much better fortunately she has no evidence of infection. The one area that is gonna require some debridement is still on the left anterior shin. Fortunately this is not hurting her as badly today. 03/10/18 on evaluation today patient appears to be doing better in some regards although she has a little bit more open area on the dorsal foot and she also  has some issues on the medial portion of the left lower extremity which is actually new and somewhat deep. With that being said there fortunately does not appear to be any significant signs of infection which is good news. No fevers, chills, nausea, or vomiting noted at this time. In general her swelling seems to be doing fairly well which is good news. 03/31/18 on evaluation today patient presents for follow-up concerning her left lower extremity lymphedema. Unfortunately she has been doing a little bit more poorly since I last saw her in regard to the amount of weeping that she is experiencing. She's also having some increased pain in the anterior shin location. Unfortunately I do not feel like the patient is making such good progress at this point a few weeks back she was definitely doing much better. 04/07/18 on evaluation today patient actually appears to be showing some signs of improvement as far as the left lower extremity is concerned. She has been tolerating the dressing changes and it does appear that the Drawtex did better for her. With that being said unfortunately home health is stating that they cannot obtain the Drawtex going forward. Nonetheless we're gonna have to check and see what they may be able to get the alginate they were using was getting stuck in causing new areas of skin being pulled all that with and subsequently weep and calls her to worsen overall this is the first time we've seen improvement at this time. 04/14/18 on evaluation today patient actually appears to be doing rather well at this  point there does not appear to be any evidence of infection at this time and she is actually doing excellent in regard to the weeping in fact she almost has no openings remaining even compared to just last week this is a dramatic improvement. No fevers chills noted 04/21/18 evaluation today patient actually appears to be doing very well. She in fact is has a small area on the posterior lower extremity location and she has a small area on the dorsal surface of her foot that are still open both of which are very close to closing. We're hoping this will be close shortly. She brought her Juxta-Lite wrap with her today hoping that would be able to put her in it unfortunately I don't think were quite at that point yet but we're getting closer. 04/28/18 upon evaluation today patient actually appears to be doing excellent in regard to her left lower extremity ulcer. In fact the region on the posterior lower extremity actually is much smaller than previously noted. Overall I'm very happy with the progress she has made. She again did bring her Juxta-Lite although we're not quite ready for that yet. 05/11/18 upon evaluation today patient actually appears to be doing in general fairly well in regard to her left lower Trinity. The swelling is very well controlled. With that being said she has a new area on the left anterior lower extremity as well as between the first and second toes of her left foot that was not present during the last evaluation. The region of her posterior left lower extremity actually appears to be almost completely healed. To be honest I'm very pleased with the way that stands. Nonetheless I do believe that the lotion may be keeping the area to moist as far as her legs are concerned subsequently I'm gonna consider discontinuing that today. 05/26/18 on evaluation today patient appears to be doing rather well in regard to her left lower should be ulcers.  In fact everything appears to be  close except for a very small area on the left posterior lower extremity. Fortunately there does not appear to be any evidence of infection at this time. Overall very pleased with her progress. 06/02/18 and evaluation today patient actually appears to be doing very well in regard to her lower extremity ulcers. She has one small area that still continues to weep that I think may benefit her being able to justify lotion and user Juxta-Lite wraps versus continued to wrap her. Nonetheless I think this is something we can definitely look into at this point. 06/23/18 on evaluation today patient unfortunately has openings of her bilateral lower extremities. In general she seems to be doing much worse than when I last saw her just as far as her overall health standpoint is concerned. She states that her discomfort is mainly due to neuropathy she's not having any other issues otherwise. No fevers, chills, nausea, or vomiting noted at this time. 06/30/18 on evaluation today patient actually appears to be doing a little worse in regard to her right lower extremity her left lower extremity of doing fairly well. Fortunately there is no sign of infection at this time. She has been tolerating the dressing changes without complication. Home health did not come out like they were supposed to for the appropriate wrap changes. They stated that they never received the orders from [place] which were fax. Nonetheless we will send a copy of the orders with the patient today as well. 07/07/18 on evaluation today patient appears to be doing much better in regard to lower extremities. She still has several openings bilaterally although since I last saw her her legs did show obvious signs of infection when she later saw her nurse. Subsequently a culture was obtained and she is been placed on Bactrim and Keflex. Fortunately things seem to be looking much better it does appear she likely had an infection. Again last week we'd  even discussed it but again there really was not any obvious sign that she had infection therefore we held off on the antibiotics. Nonetheless I'm glad she's doing better today. 07/14/18 on evaluation today patient appears to be doing much better regarding her bilateral lower Trinity's. In fact on the right lower for me there's nothing open at this point there are some dry skin areas at the sites where she had infection. Fortunately there is no evidence of systemic infection which is excellent news. No fevers chills noted 07/21/18 on evaluation today patient actually appears to be doing much better in regard to her left lower extremity ulcers. She is making good progress and overall I feel like she's improving each time I see her. She's having no pain I do feel like the infection is completely resolved which is excellent news. No fevers, chills, nausea, or vomiting noted at this time. 07/28/18 on evaluation today patient appears to be doing very well in regard to her left lower [employer]. Everything seems to be showing signs of improvement which is excellent news. Overall very pleased with the progress that has been made. Fortunately there's no evidence of active infection at this time also excellent news. 08/04/18 on evaluation today patient appears to be doing more poorly in regard to her bilateral lower extremities. She has two new areas open up on the right and these were completely closed as of last week. She still has the two spots on the left which in my pinion seem to be doing better. Fortunately there's  no evidence of infection again at this point. 08/11/18 on evaluation today patient actually appears to be doing very well in regard to her bilateral lower Trinity wounds that all seem to be doing better and are measures smaller today. Fortunately there's no signs of infection. No fevers, chills, nausea, or vomiting noted at this time. 08/18/18 on evaluation today patient actually appears to  be doing about the same inverter bilateral lower extremities. She continues to have areas that blistering open as was drain that fortunately nothing too significant. Overall I feel like Drawtex may have done better for her however compared to the collagen. 08/25/18 on evaluation today patient appears to be doing a little bit more poorly today even compared to last time I saw her. Again I'm not exactly sure why she's making worse progress over the past several weeks. I'm beginning to wonder if there is some kind of underlying low level infection causing this issue. I did actually take a culture from the left anterior lower extremity but it was a new wound draining quite a bit at this point. Unfortunately she also seems to be having more pain which is what also makes me worried about the possibility of infection. This is despite never erythema noted at this point. 09/01/18 on evaluation today patient actually appears to be doing a little worse even compared to last week in regard to bilateral lower extremities. She did go to the hospital on the 16th was given a dose of IV Zosyn and then discharged with a recommendation to continue with the Bactrim that I previously prescribed for her. Nonetheless she is still having a lot of discomfort she tells me as well at this time. This is definitely unfortunate. No fevers, chills, nausea, or vomiting noted at this time. 09/08/18 on evaluation today patient's bilateral lower extremities actually appear to be shown signs of improvement which is good news. Fortunately there does not appear to be any signs of active infection I think the anabiotic is helping in this regard. Overall I'm very pleased with how she is progressing. 09/15/18 patient was actually seen in ER yesterday due to her legs as well unfortunately. She states she's been having a lot of pain and discomfort as well as a lot of drainage. Upon inspection today the patient does have a lot of swelling and  drainage I feel like this is more related to lymphedema and poor fluid control than it is to infection based on what I'm seeing. The physician in the emergency department also doubted that the patient was having a significant infection nonetheless I see no evidence of infection obvious at this point although I do see evidence of poor fluid control. She still not using a compression pumps, she is not elevating due to her lift chair as well as her hospital bed being broken, and she really is not keeping her legs up as much as they should be and also has been taking off her wraps. All this combined I think has led to poor fluid control and to be honest she may be somewhat volume overloaded in general as well. I recommend that she may need to contact your physician to see if a prescription for a diuretic would be beneficial in their opinion. As long as this is safe I think it would likely help her. 09/29/18 on evaluation today patient's left lower extremity actually appears to be doing quite a bit better. At least compared to last time that I saw her. She still has a  large area where she is draining from but there's a lot of new skin speckled trout and in fact there's more new skin that there are open areas of weeping and drainage at this point. This is good news. With regard to the right lower extremity this is doing much better with the only open area that I really see being a dry spot on the right lateral ankle currently. Fortunately there's no signs of active infection at this time which is good news. No fevers, chills, nausea, or vomiting noted at this time. The patient seems somewhat stressed and overwhelmed during the visit today she was very lethargic as such. She does and she is not taking any pain medications at this point. Apparently according to her husband are also in the process of moving which is probably taking its toll on her as well. 10/06/18 on evaluation today patient appears to be  doing rather well in regard to her lower extremities compared to last evaluation. Fortunately there's no signs of active infection. She tells me she did have an appointment with her primary. Nonetheless he was concerned that the wounds were somewhat deep based on pictures but we never actually saw her legs. She states that he had her somewhat worried due to the fact that she was fearing now that she was [place] have to have an amputation. With that being said based on what I'm seeing check she looks better this week that she has the last two times I've seen her with much less drainage I'm actually pleased in this regard. That doesn't mean that she's out of the water but again I do not think what the point of talking about education at all in regard to her leg. She is very happy to hear this. She is also not having as much pain as she was having last week. 10/13/18 unfortunately on evaluation today patient still continues to have a significant amount of drainage she's not letting home health actually apply the compression dressings at this point. She's trying to use of Juxta-Lite of the top of Kerlex and the second layer of the three layer compression wrap. With that being said she just does not seem to be making as good a progress as I would expect if she was having the compression applied and in place on a regular basis. No fevers, chills, nausea, or vomiting noted at this time. 10/20/18 on evaluation today patient appears to be doing a little better in regard to her bilateral lower extremity ulcers. In fact the right lower extremity seems to be healed she doesn't even have any openings at this point left lower extremity though still somewhat macerated seems to be showing signs of new skin growth at multiple locations throughout. Fortunately there's no evidence of active infection at this time. No fevers, chills, nausea, or vomiting noted at this time. 10/27/18 on evaluation today patient appears to be  doing much better in regard to her left lower Trinity ulcer. She's been tolerating the laptop complication and has minimal drainage noted at this point. Fortunately there's no signs of active infection at this time. No fevers, chills, nausea, or vomiting noted at this time. 11/03/18 on evaluation today patient actually appears to be doing excellent in regard to her left lower extremity. She is having very little drainage at this point there does not appear to be any significant signs of infection overall very pleased with how things have gone. She is likewise extremely pleased still and seems to be making  wonderful progress week to week. I do believe antibiotics were helpful for her. Her primary care provider did place on amateur clean since I last saw her. 11/17/18 on evaluation today patient appears to be doing worse in regard to her bilateral lower extremities at this point. She is been tolerating the dressing changes without complication. With that being said she typically takes the Coban off fairly quickly upon arriving home even after being seen here in the clinic and does not allow home health reapply command as part of the dressing at home. Therefore she said no compression essentially since I last saw her as best I can tell. With that being said I think it shows and how much swelling she has in the open wounds that are noted at this point. Fortunately there's no signs of infection but unfortunately if she doesn't get this under control I think she will end up with infection and more significant issues. 11/24/18 on evaluation today patient actually appears to be doing somewhat better in regard to her bilateral lower extremities. 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 Nonetheless that is doing better currently so she should be able to attempt  reinitiating the lymphedema pumps at this time. No fevers, chills, nausea, or vomiting noted at this time. 12/01/18 upon evaluation today patient's left lower extremity appears to be doing quite well unfortunately her right lower extremity is not doing nearly as well. She has been tolerating the dressing changes without complication unfortunately she did not keep a wrap on the right at this time. Nonetheless I believe this has led to increased swelling and weeping in the world is actually much larger than during the last evaluation with her. 12/08/18 on evaluation today patient appears to be doing about the same at this point in regard to her right lower extremity. There is some more palatable to touch I'm concerned about the possibility of there being some infection although I think the main issue is she's not keeping her compression wrap on which in turn is not allowing this area to heal appropriately. 12/22/18 on evaluation today patient appears to be doing better in regard to left lower extremity unfortunately significantly worse in regard to the right lower extremity. The areas of blistering and necrotic superficial tissue have spread and again this does not really appear to be signs of infection and all she just doesn't seem to be doing nearly as well is what she has been in the past. Overall I feel like the Augmentin did absolutely nothing for her she doesn't seem to have any infection again I really didn't think so last time either is more of a potential preventative measure and hoping that this would make some difference but I think the main issue is she's not wearing her compression. She tells me she cannot wear the [place] been we put on she takes it off pretty much upon getting home. Subsequently she worshiped Juxta-Lite when I questioned her about how often she wears it this is no more than three hours a day obviously that leaves 21 hours that she has no compression and this is obviously  not doing well for her. Overall I'm concerned that if things continue to worsen she is at great risk of both infection as well as losing her leg. 01/05/19 on evaluation today patient  appears to be doing well in regard to her left lower extremity which he is allowing [place] to wrap and not so well with regard to her right lower extremity which she is not allowing [place] to really wrap and keep the wrap on. She states that it hurts too badly whenever it's wrapped and she ends up having to take it off. She's been using the Juxta-Lite she tells me up to six hours a day although I question whether or not that's really been the case to be honest. Previously she told me three hours today nonetheless obviously the legs as long as the wrap is doing great when she is not is doing much more poorly. 01/12/2019 on evaluation today patient actually appears to be doing a little better in my opinion with regard to her right lower extremity ulcer. She has a small open area on the left lower extremity unfortunately but again this I think is part of the normal fluctuation of what she is going to have to expect with regard to her legs especially when she is not using her lymphedema pumps on a regular basis. Subsequently based on what I am seeing today I think that she does seem to be doing slightly better with regard to her right lower extremity she did see her primary care provider on Monday they felt she had an infection and placed her on 2 antibiotics. Both Cipro and clindamycin. Subsequently again she seems possibly to be doing a little bit better in regards to the right lower extremity she also tells me however she has been wearing the compression wrap over the past week since I spoke with her as well that is a Kerlix and Coban wrap on the right. No fevers, chills, nausea, vomiting, or diarrhea. 01/19/2019 on evaluation today patient appears to be doing better with regard to her bilateral lower extremities especially the  right. I feel like the compression has been beneficial for her which is great news. She did get a call from her primary care provider on her way here today telling her that she did have methicillin-resistant Staphylococcus aureus and he was calling in a couple new antibiotics for her including a ointment to be applied she tells me 3 times a day. With that being said this sounds like likely to be Bactroban which I think could be applied with each dressing/wrap change but I would not be able to accommodate her applying this 3 times a day. She is in agreement with the least doing this we will add that to her orders today. 01/26/2019 on evaluation today patient actually appears to be doing much better with regard to her right lower extremity. Her left lower extremity is also doing quite well all things considering. Fortunately there is no evidence of active infection at this time. No fevers, chills, nausea, vomiting, or diarrhea. 02/02/2019 on evaluation today patient appears to be doing much better compared to her last evaluation. Little by little off like her right leg is returning more towards normal. There does not appear to be any signs of active infection and overall she seems to be doing quite well which is great news. I am very pleased in this regard. No fevers, chills, nausea, vomiting, or diarrhea. 02/09/2019 upon evaluation today patient appears to be doing better with regard to her bilateral lower extremities. She has been tolerating the dressing changes without complication. Fortunately there is no signs of active infection at this time. No fevers, chills, nausea, vomiting, or diarrhea. 02/23/2019  on evaluation today patient actually appears to be doing quite well with regard to her bilateral lower extremities. She has been tolerating the dressing changes without complication. She is even used her pumps one time and states that she really felt like it felt good. With that being said she seems  to be in good spirits and her legs appear to be doing excellent. 03/09/2019 on evaluation today patient appears to be doing well with regard to her right lower extremity there are no open wounds at this time she is having some discomfort but I feel like this is more neuropathy than anything. With regard to her left lower extremity she had several areas scattered around that she does have some weeping and drainage from but again overall she does not appear to be having any significant issues and no evidence of infection at this time which is good news. 03/23/2019 on evaluation today patient appears to be doing well with regard to her right lower extremity which she tells me is still close she is using her juxta light here. Her left lower extremity she mainly just has an area on the foot which is still slightly draining although this also is doing great. Overall very pleased at this time. 04/06/2019 patient appears to be doing a little bit worse in regard to her left lower extremity upon evaluation today. She feels like this could be becoming infected again which she had issues with previous. Fortunately there is no signs of systemic infection but again this is always a struggle with her with her legs she will go from doing well to not so well in a very short amount of time. 04/20/2019 on evaluation today patient actually appears to be doing quite well with regard to her right lower extremity I do not see any signs of active infection at this time. Fortunately there is no fever chills noted. She is still taking the antibiotics which I prescribed for her at this point. In regard to the left lower extremity I do feel like some of these areas are better although again she still is having weeping from several locations at this time. 04/27/2019 on evaluation today patient appears to be doing about the same if not slightly worse in regard to her left lower extremity ulcers. She tells me when questioned that  she has been sleeping in her Hoveround chair in fact she tells me she falls asleep without even knowing it. I think she is spending a whole lot of time in the chair and less time walking and moving around which is not good for her legs either. On top of that she is in a seated position which is also the worst position she is not really elevating her legs and she is also not using her lymphedema pumps. All this is good to contribute to worsening of her condition in general. 05/18/2019 on evaluation today patient appears to be doing well with regard to her lower extremity on the right in fact this is showing no signs of any open wounds at this time. On the left she is continuing to have issues with areas that do drain. Some of the regions have healed and there are couple areas that have reopened. She did go to the ER per the patient according to recommendations from the home health nurse due to what she was seen when she came out on 05/13/2019. Subsequently she felt like the patient needed to go to the hospital due to the fact that again she  was having "milky white discharge" from her leg. Nonetheless she had and then was placed on doxycycline and subsequently seems to be doing better. 06/01/2019 upon evaluation today patient appears to be doing really in my opinion about the same. I do not see any signs of active infection which is good news. Overall she still has wounds over the bilateral lower extremities she has reopened on the right but this appears to be more of a crack where there is weeping/edema coming from the region. I do not see any evidence of infection at either site based on what I visualized today. 07/13/2019 upon evaluation today patient appears to be doing a little worse compared to last time I saw her. She since has been in the hospital from 06/21/2019 through 06/29/2019. This was secondary to having Covid. During that time they did apply lotion to her legs which unfortunately has  caused her to develop a myriad of open wounds on her lower extremities. Her legs do appear to be doing better as far as the overall appearance is concerned but nonetheless she does have more open and weeping areas. 07/27/2019 upon evaluation today patient appears to be doing more poorly to be honest in regard to her left lower extremity in particular. There is no signs of systemic infection although I do believe she may have local infection. She notes she has been having a lot of blue/green drainage which is consistent potentially with Pseudomonas. That may be something that we need to consider here as well. The doxycycline does not seem to have been helping. Objective Constitutional Well-nourished and well-hydrated in no acute distress. Vitals Time Taken: 10:09 AM, Height: 62 in, Weight: 335 lbs, BMI: 61.3, Temperature: 98.5 F, Pulse: 104 bpm, Respiratory Rate: 18 breaths/min, Blood Pressure: 156/83 mmHg. Respiratory normal breathing without difficulty. Psychiatric this patient is able to make decisions and demonstrates good insight into disease process. Alert and Oriented x 3. pleasant and cooperative. General Notes: His wounds currently on the left lower extremity seem to be showing some signs of irritation there is a lot of skin that is loosening up and sloughing off. Again she does have a lot of drainage she tells me this is more thick and it does appear to be purulent she is noting blue-green coloration I see that as well. Again this can be very consistent with Pseudomonas. Obviously the doxycycline would not help if it were something as such. Integumentary (Hair, Skin) Wound #61 status is Open. Original cause of wound was Gradually Appeared. The wound is located on the Left,Circumferential Lower Leg. The wound measures 19cm length x 32cm width x 0.1cm depth; 477.522cm^2 area and 47.752cm^3 volume. There is Fat Layer (Subcutaneous Tissue) Exposed exposed. There is no tunneling  or undermining noted. There is a large amount of purulent drainage noted. The wound margin is flat and intact. There is no granulation within the wound bed. There is a large (67-100%) amount of necrotic tissue within the wound bed including Adherent Slough. Wound #63 status is Open. Original cause of wound was Gradually Appeared. The wound is located on the Right,Medial Malleolus. The wound measures 3.5cm length x 4cm width x 0.1cm depth; 10.996cm^2 area and 1.1cm^3 volume. There is Fat Layer (Subcutaneous Tissue) Exposed exposed. There is no tunneling or undermining noted. There is a medium amount of serosanguineous drainage noted. The wound margin is well defined and not attached to the wound base. There is large (67-100%) pale granulation within the wound bed. There is a small (1-  33%) amount of necrotic tissue within the wound bed including Adherent Slough. Wound #64 status is Open. Original cause of wound was Gradually Appeared. The wound is located on the Left,Dorsal Foot. The wound measures 12cm length x 8cm width x 0.1cm depth; 75.398cm^2 area and 7.54cm^3 volume. There is Fat Layer (Subcutaneous Tissue) Exposed exposed. There is no tunneling or undermining noted. There is a medium amount of purulent drainage noted. The wound margin is flat and intact. There is no granulation within the wound bed. There is a large (67-100%) amount of necrotic tissue within the wound bed including Adherent Slough. 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 Dressing Change Frequency: Change dressing three times week. - both legs Wound Cleansing: Clean wound with Wound Cleanser - all wounds May shower with protection. Primary Wound Dressing: Wound #61 Left,Circumferential Lower Leg: Calcium Alginate with Silver Wound #63 Right,Medial Malleolus: Calcium Alginate with Silver Wound #64 Left,Dorsal Foot: Calcium Alginate with Silver Secondary Dressing: Wound #61 Left,Circumferential Lower Leg: Dry Gauze - all wounds ABD pad - as needed Zetuvit or Kerramax Wound #63 Right,Medial Malleolus: Dry Gauze - all wounds ABD pad - as needed Zetuvit or Kerramax Wound #64 Left,Dorsal Foot: Dry Gauze - all wounds ABD pad - as needed Zetuvit or Kerramax - as needed Edema Control: 3 Layer Compression System - Bilateral Avoid standing for long periods of time - walking is encouraged Elevate legs to the level of the heart or above for 30 minutes daily and/or when sitting, a frequency of: - do not sleep in chair with feet dangling, MUST elevate legs while sitting Exercise regularly Support Garment 20-30 mm/Hg pressure to: - Juxtalite to right lower leg if unable to tolerate wrap Segmental Compressive Device. - lymphedema pumps 60 minutes 1- 2 times per day Off-Loading: Turn and reposition every 2 hours Additional Orders / Instructions: Follow Nutritious Diet - To include vitamin A, vitamin C, and Zinc along with increased protein intake. Home Health: [facility] for wound care. - [facility] Laboratory ordered were: Anerobic culture - left lower leg The following medication(s) was prescribed: Levaquin oral 750 mg tablet 1 1 tablet oral taken 1 time a day for 14 days. Do not take magnesium  with this medication starting 07/27/2019 1. I would recommend currently that we go ahead and initiate a treatment with Levaquin which I think will be appropriate for the patient it covers better for gram-negative organisms. That may be what we are dealing with here. 2. I am also going to suggest at this time that we go ahead and initiate the 3 layer compression wrap discontinuing the Unna boot she feels like that was not helpful for her. 3. I also recommend she needs to be using her lymphedema pumps also think she is to be elevating her legs on a regular basis. She also needs to be taking her Lasix which does not sound like she has been doing regularly  either. All of this will help with her edema and subsequently getting things doing better. We will see patient back for reevaluation in 1 week here in the clinic. If anything worsens or changes patient will contact our office for additional recommendations. Electronic Signature(s) Signed: 07/27/2019 10:53:41 AM By: [name] PA-C Previous Signature: 07/27/2019 10:46:53 AM Version By: [name] PA-C Previous Signature: 07/27/2019 10:46:53 AM Version By: [name] PA-C Entered By: [name] on 07/27/2019 10:53:41 -------------------------------------------------------------------------------- SuperBill Details Patient Name: Date of Service: [name] 07/27/2019 Medical Record [id number] Patient Account Number: [id number] Date of Birth/Sex: Treating RN: 08-15-1950 (68 y.o. [name] Primary Care Provider: [name] Other Clinician: Referring Provider: Treating Provider/Extender:Stone III, [name], FRED Weeks in Treatment: 103 Diagnosis Coding ICD-10 Codes Code Description E11.622 Type 2 diabetes mellitus with other skin ulcer I89.0 Lymphedema, not elsewhere classified I87.331 Chronic venous hypertension (idiopathic) with ulcer and inflammation of right lower extremity I87.332 Chronic venous  hypertension (idiopathic) with ulcer and inflammation of left lower extremity L97.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 leg with fat layer exposed L97.822 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 I10 Essential (primary) hypertension E66.01 Morbid (severe) obesity due to excess calories F41.8 Other specified anxiety disorders R53.1 Weakness Physician Procedures CPT4 Code Description: [id number] - WC PHYS LEVEL 4 - EST PT ICD-10 Diagnosis Description E11.622 Type 2 diabetes mellitus with other skin ulcer I89.0 Lymphedema, not elsewhere classified I87.331 Chronic venous hypertension (idiopathic) with ulcer and  lower extremity I87.332 Chronic venous hypertension (idiopathic) with ulcer and extremity Modifier: inflammation of inflammation of Quantity: 1 right left lower Electronic Signature(s) Signed: 07/27/2019 10:47:12 AM By: [name] PA-C Entered By: [name] on 07/27/2019 10:47:10

## 2019-08-02 LAB — AEROBIC CULTURE W GRAM STAIN (SUPERFICIAL SPECIMEN)

## 2019-08-03 ENCOUNTER — Other Ambulatory Visit: Payer: Self-pay

## 2019-08-03 ENCOUNTER — Encounter (HOSPITAL_BASED_OUTPATIENT_CLINIC_OR_DEPARTMENT_OTHER): Payer: Medicare PPO | Admitting: Physician Assistant

## 2019-08-03 DIAGNOSIS — L97822 Non-pressure chronic ulcer of other part of left lower leg with fat layer exposed: Secondary | ICD-10-CM | POA: Diagnosis not present

## 2019-08-03 DIAGNOSIS — E11622 Type 2 diabetes mellitus with other skin ulcer: Secondary | ICD-10-CM | POA: Diagnosis not present

## 2019-08-03 DIAGNOSIS — I890 Lymphedema, not elsewhere classified: Secondary | ICD-10-CM | POA: Diagnosis not present

## 2019-08-03 DIAGNOSIS — Z6841 Body Mass Index (BMI) 40.0 and over, adult: Secondary | ICD-10-CM | POA: Diagnosis not present

## 2019-08-03 DIAGNOSIS — G43909 Migraine, unspecified, not intractable, without status migrainosus: Secondary | ICD-10-CM | POA: Diagnosis not present

## 2019-08-03 DIAGNOSIS — F329 Major depressive disorder, single episode, unspecified: Secondary | ICD-10-CM | POA: Diagnosis not present

## 2019-08-03 DIAGNOSIS — J45909 Unspecified asthma, uncomplicated: Secondary | ICD-10-CM | POA: Diagnosis not present

## 2019-08-03 DIAGNOSIS — Z9119 Patient's noncompliance with other medical treatment and regimen: Secondary | ICD-10-CM | POA: Diagnosis not present

## 2019-08-03 DIAGNOSIS — M1990 Unspecified osteoarthritis, unspecified site: Secondary | ICD-10-CM | POA: Diagnosis not present

## 2019-08-03 DIAGNOSIS — I69851 Hemiplegia and hemiparesis following other cerebrovascular disease affecting right dominant side: Secondary | ICD-10-CM | POA: Diagnosis not present

## 2019-08-03 DIAGNOSIS — I87332 Chronic venous hypertension (idiopathic) with ulcer and inflammation of left lower extremity: Secondary | ICD-10-CM | POA: Diagnosis not present

## 2019-08-03 DIAGNOSIS — L97522 Non-pressure chronic ulcer of other part of left foot with fat layer exposed: Secondary | ICD-10-CM | POA: Diagnosis not present

## 2019-08-03 DIAGNOSIS — E1140 Type 2 diabetes mellitus with diabetic neuropathy, unspecified: Secondary | ICD-10-CM | POA: Diagnosis not present

## 2019-08-03 DIAGNOSIS — I10 Essential (primary) hypertension: Secondary | ICD-10-CM | POA: Diagnosis not present

## 2019-08-03 DIAGNOSIS — M109 Gout, unspecified: Secondary | ICD-10-CM | POA: Diagnosis not present

## 2019-08-03 DIAGNOSIS — F419 Anxiety disorder, unspecified: Secondary | ICD-10-CM | POA: Diagnosis not present

## 2019-08-03 DIAGNOSIS — L97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 leg with fat layer exposed: Secondary | ICD-10-CM | POA: Diagnosis not present

## 2019-08-03 DIAGNOSIS — R531 Weakness: Secondary | ICD-10-CM | POA: Diagnosis not present

## 2019-08-03 DIAGNOSIS — I87331 Chronic venous hypertension (idiopathic) with ulcer and inflammation of right lower extremity: Secondary | ICD-10-CM | POA: Diagnosis not present

## 2019-08-03 NOTE — Progress Notes (Addendum)
DEZI, BRAUNER ([id number]) Visit Report for 08/03/2019 Chief Complaint Document Details Patient Name: Date of Service: KAJSA, BUTRUM 08/03/2019 8:30 AM Medical Record [id number] Patient Account Number: [id number] Date of Birth/Sex: Treating RN: [birth year] (69 y.o. [name] Primary Care Provider: [name] Other Clinician: Referring Provider: Treating Provider/Extender:Stone III, [name], FRED Weeks in Treatment: 104 Information Obtained from: Patient Chief Complaint Bilateral reoccurring LE ulcers Electronic Signature(s) Signed: 08/03/2019 8:37:03 AM By: [name] PA-C Entered By: [name] on 08/03/2019 08:37:03 -------------------------------------------------------------------------------- HPI Details Patient Name: Date of Service: [name] 08/03/2019 8:30 AM Medical Record [id number] Patient Account Number: [id number] Date of Birth/Sex: Treating RN: [birth year] (68 y.o. [name] Primary Care Provider: [name] Other Clinician: Referring Provider: Treating Provider/Extender:Stone III, [name], FRED Weeks in Treatment: 104 History of Present Illness HPI Description: this patient has been seen a couple of times before and returns with recurrent problems to her right and left lower extremity with swelling and weeping ulcerations due to not wearing her compression stockings which she had been advised to do during her last discharge, at the end of June 2018. During her last visit the patient had had normal arterial blood flow and her venous reflux study did not necessitate any surgical intervention. She was recommended compression and elevation and wound care. After prolonged treatment the patient was completely healed but she has been noncompliant with wearing or compressions.. She was here last week with an outpatient return visit planned but the patient came in a very poor general condition with altered mental  status and was rushed to the ER on my request. With a history of hypertension, diabetes, TIA and right-sided weakness she was set up for an MRI on her brain and cervical spine and was sent to [facility]. Getting an MRI done was very difficult but once the workup was done she was found not to have any spinal stenosis, epidural abscess or hematoma or discitis. This was radiculopathy to be treated as an outpatient and she was given a follow-up appointment. Today she is feeling much better alert and oriented and has come to reevaluate her bilateral lower extremity lymphedema and ulceration 03/25/2017 -- she was admitted to the hospital on 03/16/2017 and discharged on 03/18/2017 with left leg cellulitis and ulceration. She was started on vancomycin and Zosyn and x-ray showed no bony involvement. She was treated for a cellulitis with IV antibiotics changed to Rocephin and Flagyl and was discharged on oral Keflex and doxycycline to complete a 7 day course. Last hemoglobin A1c was 7.1 and her other ailments including hypertension got asthma were appropriately treated. 05/06/2017 -- she is awaiting the right size of compression stockings from [place] but other than that has been doing well. ====== Old notes 69 year old patient was seen one time last October and was lost to follow-up. She has recurrent problems with weeping and ulceration of her left lower extremity and has swelling of this for several years. It has been worse for the last 2 months. Past medical history is significant for diabetes mellitus type 2, hypertension, gout, morbid obesity, depressive disorders, hiatal hernia, migraines, status post knee surgery, risk of a cholecystectomy, vaginal hysterectomy and breast biopsy. She is not a smoker. As noted before she has never had a venous duplex study and an arterial ABI study was attempted but the left lower extremity was noncompressible 10/01/2016 -- had a lower extremity venous duplex  reflux evaluation which showed no evidence of deep vein reflux  in the right or left lower extremity, and no evidence of great saphenous vein reflux more than 500 ms in the right or left lower extremity, and the left small saphenous vein is incompetent but no vascular consult was recommended. review of her electronic medical records noted that the ABI was checked in July 2017 where the right ABI was normal limits and the left ABI could not be ascertained due to pain with cuff pressure but the waveforms are within normal limits. her arterial duplex study scheduled for April 27. 10/08/2016 -- the patient has various reasons for not having a compression on and for the last 3 days she has had no compression on her left lower extremity either due to pain or the lack of nursing help. She does not use her juxta lites either. 10/15/2016 -- the patient did not keep her appointment for arterial duplex study on April 27 and I have asked her to reschedule this. Her pain is out of proportion with the physical findings and she continuously fails to wear a compression wraps and cuts them off because she says she cannot tolerate the pain. She does not use her juxta lites either. 10/22/2016 -- he has rescheduled her arterial duplex study to May 21 and her pain today is a bit better. She has not been wearing her juxta lites on her right lower extremity but now understands that she needs to do this. She did tolerate the to press compression wrap on her left lower extremity 10/29/2016 --arterial duplex study is scheduled for next week and overall she has been tolerating her compression wraps and also using her juxta lites on her right lower extremity 11/05/2016 -- the right ABI was 0.95 the left was 1.03. The digit TBI is on the right was 0.83 on the left was 0.92 and she had biphasic flow through these vessels. The impression was that of normal lower extremity arterial study. 11/12/2016 -- her pain is minimal and  she is doing very well overall. 11/26/2016 -- she has got juxta lites and her insurance will not pay for additional dual layer compression stockings. She is going to order some from [place]. 05/12/2017 -- her juxta lites are very old and too big for her and these have not been helping with compression. She did get 20-30 mm compression stockings from [place] but she and her husband are unable to put these on. I believe she will benefit from bilateral Extremit-ease, compression stockings and we will measure her for these today. 05/20/2017 -- lymphedema on the left lower extremity has increased a lot and she has a open ulceration as a result of this. The right lower extremity is looking pretty good. She has decided to by the compression stockings herself and will get reimbursed by the home health, at a later date. 05/27/2017 -- her sciatica is bothering her a lot and she thought her left leg pain was caused due to the compression wrap and hence removed it and has significant lymphedema. There is no inflammation on this left lower extremity. 06/17/17 on evaluation today patient appears to be doing very well and in fact is completely healed in regard to her ulcerations. Unfortunately however she does have continued issues with lymphedema nonetheless. We did order compression garments for her unfortunately she states that the size that she received were large although we ordered medium. Obviously this means she is not getting the optimal compression. She does not have those with her today and therefore we could not confirm and contact  the company on her behalf. Nonetheless she does state that she is going to have her husband bring them by tomorrow so that we can verify and then get in touch with the company. No fevers, chills, nausea, or vomiting noted at this time. Overall patient is doing better otherwise and I'm pleased with the progress she has made. 07/01/17 on evaluation today patient appears to  be doing very well in regard to her bilateral lower extremity she does not have any openings at this point which is excellent news. Overall I'm pleased with how things have progressed up to this time. Since she is doing so well we did order her compression which we are seeing her today to ensure that it fits her properly and everything is doing well in that regard and then subsequently she will be discharged. ============ Old Notes: 03/31/16 patient presents today for evaluation concerning open wounds that she has over the left medial ankle region as well as the left dorsal foot. She has previously had this occur although it has been healed for a number of months after having this for about a year prior until her hospitalization on 01/05/16. At that point in time it appears that she was admitted to the hospital for left lower extremity cellulitis and was placed on vancomycin and Zosyn at that point. Eventually upon discharge on January 15, 2016 she was placed on doxycycline at that point in time. Later on 03/27/16 positive wound culture growing Escherichia coli this was switched to amoxicillin. Currently she tells me that she is having pain radiated to be a 7 out of 10 which can be as high as 10 out of 10 with palpation and manipulation of the wound. This wound appears to be mainly venous in nature due to the bilateral lower extremity venous stasis/lymphedema. This is definitely much worse on her left than the right side. She does have type 1 diabetes mellitus, hypertension, morbid obesity, and is wheelchair dependent.during the course of the hospital stay a blood culture was also obtained and fortunately appeared negative. She also had an x-ray of the tibia/fibula on the left which showed no acute bone abnormality. Her white blood cell count which was performed last on 03/25/16 was 7.3, hemoglobin 12.8, protein 7.1, albumin 3.0. Her urine culture appeared to be negative for any specific  organisms. Patient did have a left lower extremity venous duplex evaluation for DVT. This did not include venous reflux studies but fortunately was negative for DVT. Patient also had arterial studies performed which revealed that she had a normal ABI on the right though this was unable to be performed on the left secondary to pain that she was having around the ankle region due to the wound. However it was stated on report that she had biphasic pulses and apparently good blood flow. ========== 06/03/17 she is here in follow-up evaluation for right lower extremity ulcer. The right lower sure he has healed but she has reopened to the left medial malleolus and dorsal foot with weeping. She is waiting for new compression garments to arrive from home health, the previous compression garments were ill fitting. We will continue with compression bilaterally and follow-up in 2 weeks Readmission: 08/05/17 on evaluation today patient appears to be doing somewhat poorly in regard to her left lower extremity especially although the right lower extremity has a small area which may no longer be open. She has been having a lot of drainage from the left lower extremity however he tells me that  she has not been able to use the EXTREMIT- EASE Compression at this point. She states that she did better and was able to actually apply the Juxta-Lite compression although the wound that she has is too large and therefore really does not compress which is why she cannot wear it at this point. She has no one who can help her put it on regular basis her son can sometimes but he's not able to do it most of the time. I do believe that's why she has begun to weave and have issues as she is currently yet again. No fevers, chills, nausea, or vomiting noted at this time. Patient is no evidence of dementia. 08/12/17 on evaluation today patient appears to still be doing fairly well in regard to the draining areas/weeping areas at  this point. With that being said she unfortunately did go to the ER yesterday due to what was felt to be possibly a cellulitis. They place her on doxycycline by mouth and discharge her home. She definitely was not admitted. With that being said she states she has had more discomfort which has been unusual for her even compared to prior times and she's had infections.08/12/17 on evaluation today patient appears to still be doing fairly well in regard to the draining areas/weeping areas at this point. With that being said she unfortunately did go to the ER yesterday due to what was felt to be possibly a cellulitis. They place her on doxycycline by mouth and discharge her home. She definitely was not admitted. With that being said she states she has had more discomfort which has been unusual for her even compared to prior times and she's had infections. 08/19/17 put evaluation today patient tells me that she's been having a lot of what sounds to be neuropathic type pain in regard to her left lower extremity. She has been using over-the-counter topical bins again which some believe. That in order to apply the she actually remove the wrap we put on her last Wednesday on Thursday. Subsequently she has not had anything on compression wise since that time. The good news is a lot of the weeping areas appear to have closed at this point again I believe she would do better with compression but we are struggling to get her to actually use what she needs to at this point. No fevers, chills, nausea, or vomiting noted at this time. 09/03/17 on evaluation today patient appears to be doing okay in regard to her lower extremities in regard to the lymphedema and weeping. Fortunately she does not seem to show any signs of infection at this point she does have a little bit of weeping occurring in the right medial malleolus area. With that being said this does not appear to be too significant which is good news. 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 She has severe skin damage secondary to both of these features involving the dorsal left foot and medial left ankle and lower leg. Still has open areas in the left anterior foot. The area on the right closed over. She uses her own juxta light stockings. She does not have an arterial issue 09/16/17 on evaluation today patient actually appears to be doing excellent in regard to her bilateral lower extremity swelling. The Juxta-Lite compression wrap seem to be doing very well for her. She has not however been using the portion that goes over her foot. Her left foot still is draining a  little bit not nearly as significant as it has been in the past but still I do believe that she likely needs to utilize the full wrap including the foot portion of this will improve as well. She also has been apparently putting on a significant amount of Vaseline which also think is not helpful for her. I recommended that if she feels she needs something for moisturizer Eucerin will probably be better. 09/30/17 on evaluation today patient presents with several new open areas in regard to her left lower extremity although these appear to be minimal and mainly seem to be more moisture breakdown than anything. Fortunately she does not seem to have any evidence of infection which is great news. She has been tolerating the dressing changes without complication we are using silver alginate on the foot she has been using AB pads to have the legs and using her Juxta-Lite compression which seems to be controlling her swelling very well. Overall I'm pleased with the poor way she has progressed. 10/14/17 on evaluation today patient appears to be doing better in regard to her left lower extremity areas of weeping. She does still have some discomfort although in general this does not appear to be as macerated and I think it  is progressing nicely. I do think she still needs to wear the foot portion of her Juxta-Lite in order to get the most benefit from the wrap obviously. She states she understands. Fortunately there does not appear to be evidence of infection at this time which is great news. 10/28/17 on evaluation today patient appears to be doing excellent in regard to her left lower extremity. She has just a couple areas that are still open and seem to be causing any trouble whatsoever. For that reason I think that she is definitely headed in the right direction the spots are very tiny compared to what we have been dealing with in the past. 11/11/17 on evaluation today patient appears to have a right lateral lower extremity ulcer that has opened since I last saw her. She states this is where the home health nurse that was coming out remove the dressing without wetting the alginate first. Nonetheless I do not know if this is indeed the case or not but more importantly we have not ordered home help to be coming out for her wounds at all. I'm unsure as to why they are coming out and we're gonna have to check on this and get things situated in that regard. With that being said we currently really do not need them to be coming out as the patient has been taking care of her leg herself without complication and no issues. In fact she was doing much better prior to nursing coming out. 11/25/17 on evaluation today patient actually appears to be doing fairly well in regard to her left lower extremity swelling. In fact she has very little area of weeping at this point there's just a small spot on the lateral portion of her right leg that still has me just a little bit more concerned as far as wanting to see this clear up before I discharge her to caring for this at home. Nonetheless overall she has made excellent progress. 12/09/17 on evaluation today patient appears to be doing rather well in regard to her lower extremity  edema. She does have some weeping still in the left lower extremity although the big area we were taking care of two weeks ago actually has closed and she has another  area of weeping on the left lower extremity immediately as well is the top of her foot. She does not currently have lymphedema pumps she has been wearing her compression daily on a regular basis as directed. With that being said I think she may benefit from lymphedema pumps. 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 We had a very difficult time getting and keeping this under control. 12/23/17 on evaluation today patient actually appears to be doing a little bit more poorly in regard to her bilateral lower extremities. She has been tolerating the Juxta-Lite compression wraps. Unfortunately she has two new ulcers on the right lower extremity and left lower Trinity ulceration seems to be larger. Obviously this is not good news. She has been tolerating the dressings without complication. 12/30/17 on evaluation today patient actually appears to be doing much better in regard to her bilateral lower extremity edema. She continues to have some issues with ulcerations and in fact there appears to be one spot on each leg where the wrap may have caused a little bit of a blister which is subsequently opened up at this point is given her pain. Fortunately it does not appear to be any evidence of infection which is good news. No fevers chills noted. 01/13/18 on evaluation today patient appears to be doing rather well in regard to her bilateral lower extremities. The dressings did get kind of stuck as far as the wound beds are concerned but again I think this is mainly due to the fact that she actually seems to be showing signs of healing which is good news. She's not having as much drainage  therefore she was having more of the dressing sticking. Nonetheless overall I feel like her swelling is dramatically down compared to previous. 01/20/18 on evaluation today patient unfortunately though she's doing better in most regards has a large blister on the left anterior lower extremity where she is draining quite significantly. Subsequently this is going to need debridement today in order to see what's underneath and ensure she does not continue to trapping fluid at this location. Nonetheless No fevers, chills, nausea, or vomiting noted at this time. 01/27/18 on evaluation today patient appears to be doing rather well at this point in regard to her right lower extremity there's just a very small area that she still has open at this point. With that being said I do believe that she is tolerating the compression wraps very well in making good progress. Home health is coming out at this point to see her. Her left lower extremity on the lateral portion is actually what still mainly open and causing her some discomfort for the most part 02/10/18 on evaluation today patient actually appears to be doing very well in regard to her right lower extremity were all the ulcers appear to be completely close. In regard to the left lower extremity she does have two areas still open and some leaking from the dorsal surface of her foot but this still seems to be doing much better to me in general. 02/24/18 on evaluation today patient actually appears to be doing much better in regard to her right lower extremity this is still completely healed. Her left lower extremity is also doing much better fortunately she has no evidence of infection. The one area that is gonna require some debridement is still  on the left anterior shin. Fortunately this is not hurting her as badly today. 03/10/18 on evaluation today patient appears to be doing better in some regards although she has a little bit more open area on the dorsal  foot and she also has some issues on the medial portion of the left lower extremity which is actually new and somewhat deep. With that being said there fortunately does not appear to be any significant signs of infection which is good news. No fevers, chills, nausea, or vomiting noted at this time. In general her swelling seems to be doing fairly well which is good news. 03/31/18 on evaluation today patient presents for follow-up concerning her left lower extremity lymphedema. Unfortunately she has been doing a little bit more poorly since I last saw her in regard to the amount of weeping that she is experiencing. She's also having some increased pain in the anterior shin location. Unfortunately I do not feel like the patient is making such good progress at this point a few weeks back she was definitely doing much better. 04/07/18 on evaluation today patient actually appears to be showing some signs of improvement as far as the left lower extremity is concerned. She has been tolerating the dressing changes and it does appear that the Drawtex did better for her. With that being said unfortunately home health is stating that they cannot obtain the Drawtex going forward. Nonetheless we're gonna have to check and see what they may be able to get the alginate they were using was getting stuck in causing new areas of skin being pulled all that with and subsequently weep and calls her to worsen overall this is the first time we've seen improvement at this time. 04/14/18 on evaluation today patient actually appears to be doing rather well at this point there does not appear to be any evidence of infection at this time and she is actually doing excellent in regard to the weeping in fact she almost has no openings remaining even compared to just last week this is a dramatic improvement. No fevers chills noted 04/21/18 evaluation today patient actually appears to be doing very well. She in fact is has a small  area on the posterior lower extremity location and she has a small area on the dorsal surface of her foot that are still open both of which are very close to closing. We're hoping this will be close shortly. She brought her Juxta-Lite wrap with her today hoping that would be able to put her in it unfortunately I don't think were quite at that point yet but we're getting closer. 04/28/18 upon evaluation today patient actually appears to be doing excellent in regard to her left lower extremity ulcer. In fact the region on the posterior lower extremity actually is much smaller than previously noted. Overall I'm very happy with the progress she has made. She again did bring her Juxta-Lite although we're not quite ready for that yet. 05/11/18 upon evaluation today patient actually appears to be doing in general fairly well in regard to her left lower Trinity. The swelling is very well controlled. With that being said she has a new area on the left anterior lower extremity as well as between the first and second toes of her left foot that was not present during the last evaluation. The region of her posterior left lower extremity actually appears to be almost completely healed. To be honest I'm very pleased with the way that stands. Nonetheless I do believe  that the lotion may be keeping the area to moist as far as her legs are concerned subsequently I'm gonna consider discontinuing that today. 05/26/18 on evaluation today patient appears to be doing rather well in regard to her left lower should be ulcers. In fact everything appears to be close except for a very small area on the left posterior lower extremity. Fortunately there does not appear to be any evidence of infection at this time. Overall very pleased with her progress. 06/02/18 and evaluation today patient actually appears to be doing very well in regard to her lower extremity ulcers. She has one small area that still continues to weep that I  think may benefit her being able to justify lotion and user Juxta-Lite wraps versus continued to wrap her. Nonetheless I think this is something we can definitely look into at this point. 06/23/18 on evaluation today patient unfortunately has openings of her bilateral lower extremities. In general she seems to be doing much worse than when I last saw her just as far as her overall health standpoint is concerned. She states that her discomfort is mainly due to neuropathy she's not having any other issues otherwise. No fevers, chills, nausea, or vomiting noted at this time. 06/30/18 on evaluation today patient actually appears to be doing a little worse in regard to her right lower extremity her left lower extremity of doing fairly well. Fortunately there is no sign of infection at this time. She has been tolerating the dressing changes without complication. Home health did not come out like they were supposed to for the appropriate wrap changes. They stated that they never received the orders from [place] which were fax. Nonetheless we will send a copy of the orders with the patient today as well. 07/07/18 on evaluation today patient appears to be doing much better in regard to lower extremities. She still has several openings bilaterally although since I last saw her her legs did show obvious signs of infection when she later saw her nurse. Subsequently a culture was obtained and she is been placed on Bactrim and Keflex. Fortunately things seem to be looking much better it does appear she likely had an infection. Again last week we'd even discussed it but again there really was not any obvious sign that she had infection therefore we held off on the antibiotics. Nonetheless I'm glad she's doing better today. 07/14/18 on evaluation today patient appears to be doing much better regarding her bilateral lower Trinity's. In fact on the right lower for me there's nothing open at this point there are some dry skin  areas at the sites where she had infection. Fortunately there is no evidence of systemic infection which is excellent news. No fevers chills noted 07/21/18 on evaluation today patient actually appears to be doing much better in regard to her left lower extremity ulcers. She is making good progress and overall I feel like she's improving each time I see her. She's having no pain I do feel like the infection is completely resolved which is excellent news. No fevers, chills, nausea, or vomiting noted at this time. 07/28/18 on evaluation today patient appears to be doing very well in regard to her left lower [employer]. Everything seems to be showing signs of improvement which is excellent news. Overall very pleased with the progress that has been made. Fortunately there's no evidence of active infection at this time also excellent news. 08/04/18 on evaluation today patient appears to be doing more poorly in regard  to her bilateral lower extremities. She has two new areas open up on the right and these were completely closed as of last week. She still has the two spots on the left which in my pinion seem to be doing better. Fortunately there's no evidence of infection again at this point. 08/11/18 on evaluation today patient actually appears to be doing very well in regard to her bilateral lower Trinity wounds that all seem to be doing better and are measures smaller today. Fortunately there's no signs of infection. No fevers, chills, nausea, or vomiting noted at this time. 08/18/18 on evaluation today patient actually appears to be doing about the same inverter bilateral lower extremities. She continues to have areas that blistering open as was drain that fortunately nothing too significant. Overall I feel like Drawtex may have done better for her however compared to the collagen. 08/25/18 on evaluation today patient appears to be doing a little bit more poorly today even compared to last time I saw  her. Again I'm not exactly sure why she's making worse progress over the past several weeks. I'm beginning to wonder if there is some kind of underlying low level infection causing this issue. I did actually take a culture from the left anterior lower extremity but it was a new wound draining quite a bit at this point. Unfortunately she also seems to be having more pain which is what also makes me worried about the possibility of infection. This is despite never erythema noted at this point. 09/01/18 on evaluation today patient actually appears to be doing a little worse even compared to last week in regard to bilateral lower extremities. She did go to the hospital on the 16th was given a dose of IV Zosyn and then discharged with a recommendation to continue with the Bactrim that I previously prescribed for her. Nonetheless she is still having a lot of discomfort she tells me as well at this time. This is definitely unfortunate. No fevers, chills, nausea, or vomiting noted at this time. 09/08/18 on evaluation today patient's bilateral lower extremities actually appear to be shown signs of improvement which is good news. Fortunately there does not appear to be any signs of active infection I think the anabiotic is helping in this regard. Overall I'm very pleased with how she is progressing. 09/15/18 patient was actually seen in ER yesterday due to her legs as well unfortunately. She states she's been having a lot of pain and discomfort as well as a lot of drainage. Upon inspection today the patient does have a lot of swelling and drainage I feel like this is more related to lymphedema and poor fluid control than it is to infection based on what I'm seeing. The physician in the emergency department also doubted that the patient was having a significant infection nonetheless I see no evidence of infection obvious at this point although I do see evidence of poor fluid control. She still not using a  compression pumps, she is not elevating due to her lift chair as well as her hospital bed being broken, and she really is not keeping her legs up as much as they should be and also has been taking off her wraps. All this combined I think has led to poor fluid control and to be honest she may be somewhat volume overloaded in general as well. I recommend that she may need to contact your physician to see if a prescription for a diuretic would be beneficial in their opinion.  As long as this is safe I think it would likely help her. 09/29/18 on evaluation today patient's left lower extremity actually appears to be doing quite a bit better. At least compared to last time that I saw her. She still has a large area where she is draining from but there's a lot of new skin speckled trout and in fact there's more new skin that there are open areas of weeping and drainage at this point. This is good news. With regard to the right lower extremity this is doing much better with the only open area that I really see being a dry spot on the right lateral ankle currently. Fortunately there's no signs of active infection at this time which is good news. No fevers, chills, nausea, or vomiting noted at this time. The patient seems somewhat stressed and overwhelmed during the visit today she was very lethargic as such. She does and she is not taking any pain medications at this point. Apparently according to her husband are also in the process of moving which is probably taking its toll on her as well. 10/06/18 on evaluation today patient appears to be doing rather well in regard to her lower extremities compared to last evaluation. Fortunately there's no signs of active infection. She tells me she did have an appointment with her primary. Nonetheless he was concerned that the wounds were somewhat deep based on pictures but we never actually saw her legs. She states that he had her somewhat worried due to the fact that  she was fearing now that she was [place] have to have an amputation. With that being said based on what I'm seeing check she looks better this week that she has the last two times I've seen her with much less drainage I'm actually pleased in this regard. That doesn't mean that she's out of the water but again I do not think what the point of talking about education at all in regard to her leg. She is very happy to hear this. She is also not having as much pain as she was having last week. 10/13/18 unfortunately on evaluation today patient still continues to have a significant amount of drainage she's not letting home health actually apply the compression dressings at this point. She's trying to use of Juxta-Lite of the top of Kerlex and the second layer of the three layer compression wrap. With that being said she just does not seem to be making as good a progress as I would expect if she was having the compression applied and in place on a regular basis. No fevers, chills, nausea, or vomiting noted at this time. 10/20/18 on evaluation today patient appears to be doing a little better in regard to her bilateral lower extremity ulcers. In fact the right lower extremity seems to be healed she doesn't even have any openings at this point left lower extremity though still somewhat macerated seems to be showing signs of new skin growth at multiple locations throughout. Fortunately there's no evidence of active infection at this time. No fevers, chills, nausea, or vomiting noted at this time. 10/27/18 on evaluation today patient appears to be doing much better in regard to her left lower Trinity ulcer. She's been tolerating the laptop complication and has minimal drainage noted at this point. Fortunately there's no signs of active infection at this time. No fevers, chills, nausea, or vomiting noted at this time. 11/03/18 on evaluation today patient actually appears to be doing excellent in regard to  her left  lower extremity. She is having very little drainage at this point there does not appear to be any significant signs of infection overall very pleased with how things have gone. She is likewise extremely pleased still and seems to be making wonderful progress week to week. I do believe antibiotics were helpful for her. Her primary care provider did place on amateur clean since I last saw her. 11/17/18 on evaluation today patient appears to be doing worse in regard to her bilateral lower extremities at this point. She is been tolerating the dressing changes without complication. With that being said she typically takes the Coban off fairly quickly upon arriving home even after being seen here in the clinic and does not allow home health reapply command as part of the dressing at home. Therefore she said no compression essentially since I last saw her as best I can tell. With that being said I think it shows and how much swelling she has in the open wounds that are noted at this point. Fortunately there's no signs of infection but unfortunately if she doesn't get this under control I think she will end up with infection and more significant issues. 11/24/18 on evaluation today patient actually appears to be doing somewhat better in regard to her bilateral lower extremities. 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 Nonetheless that is doing better currently so she should be able to attempt reinitiating the lymphedema pumps at this time. No fevers, chills, nausea, or vomiting noted at this time. 12/01/18 upon evaluation today patient's left lower extremity appears to be doing quite well unfortunately her right lower extremity is not doing nearly as well. She has been tolerating the dressing changes without complication unfortunately she did not keep a wrap on the  right at this time. Nonetheless I believe this has led to increased swelling and weeping in the world is actually much larger than during the last evaluation with her. 12/08/18 on evaluation today patient appears to be doing about the same at this point in regard to her right lower extremity. There is some more palatable to touch I'm concerned about the possibility of there being some infection although I think the main issue is she's not keeping her compression wrap on which in turn is not allowing this area to heal appropriately. 12/22/18 on evaluation today patient appears to be doing better in regard to left lower extremity unfortunately significantly worse in regard to the right lower extremity. The areas of blistering and necrotic superficial tissue have spread and again this does not really appear to be signs of infection and all she just doesn't seem to be doing nearly as well is what she has been in the past. Overall I feel like the Augmentin did absolutely nothing for her she doesn't seem to have any infection again I really didn't think so last time either is more of a potential preventative measure and hoping that this would make some difference but I think the main issue is she's not wearing her compression. She tells me she cannot wear the [place] been we put on she takes it off pretty much upon getting home. Subsequently she worshiped Juxta-Lite when I questioned her about how often she wears it this is no more than three hours a day obviously that leaves 21  hours that she has no compression and this is obviously not doing well for her. Overall I'm concerned that if things continue to worsen she is at great risk of both infection as well as losing her leg. 01/05/19 on evaluation today patient appears to be doing well in regard to her left lower extremity which he is allowing [place] to wrap and not so well with regard to her right lower extremity which she is not allowing [place] to really wrap  and keep the wrap on. She states that it hurts too badly whenever it's wrapped and she ends up having to take it off. She's been using the Juxta-Lite she tells me up to six hours a day although I question whether or not that's really been the case to be honest. Previously she told me three hours today nonetheless obviously the legs as long as the wrap is doing great when she is not is doing much more poorly. 01/12/2019 on evaluation today patient actually appears to be doing a little better in my opinion with regard to her right lower extremity ulcer. She has a small open area on the left lower extremity unfortunately but again this I think is part of the normal fluctuation of what she is going to have to expect with regard to her legs especially when she is not using her lymphedema pumps on a regular basis. Subsequently based on what I am seeing today I think that she does seem to be doing slightly better with regard to her right lower extremity she did see her primary care provider on Monday they felt she had an infection and placed her on 2 antibiotics. Both Cipro and clindamycin. Subsequently again she seems possibly to be doing a little bit better in regards to the right lower extremity she also tells me however she has been wearing the compression wrap over the past week since I spoke with her as well that is a Kerlix and Coban wrap on the right. No fevers, chills, nausea, vomiting, or diarrhea. 01/19/2019 on evaluation today patient appears to be doing better with regard to her bilateral lower extremities especially the right. I feel like the compression has been beneficial for her which is great news. She did get a call from her primary care provider on her way here today telling her that she did have methicillin-resistant Staphylococcus aureus and he was calling in a couple new antibiotics for her including a ointment to be applied she tells me 3 times a day. With that being said this sounds  like likely to be Bactroban which I think could be applied with each dressing/wrap change but I would not be able to accommodate her applying this 3 times a day. She is in agreement with the least doing this we will add that to her orders today. 01/26/2019 on evaluation today patient actually appears to be doing much better with regard to her right lower extremity. Her left lower extremity is also doing quite well all things considering. Fortunately there is no evidence of active infection at this time. No fevers, chills, nausea, vomiting, or diarrhea. 02/02/2019 on evaluation today patient appears to be doing much better compared to her last evaluation. Little by little off like her right leg is returning more towards normal. There does not appear to be any signs of active infection and overall she seems to be doing quite well which is great news. I am very pleased in this regard. No fevers, chills, nausea, vomiting, or diarrhea. 02/09/2019  upon evaluation today patient appears to be doing better with regard to her bilateral lower extremities. She has been tolerating the dressing changes without complication. Fortunately there is no signs of active infection at this time. No fevers, chills, nausea, vomiting, or diarrhea. 02/23/2019 on evaluation today patient actually appears to be doing quite well with regard to her bilateral lower extremities. She has been tolerating the dressing changes without complication. She is even used her pumps one time and states that she really felt like it felt good. With that being said she seems to be in good spirits and her legs appear to be doing excellent. 03/09/2019 on evaluation today patient appears to be doing well with regard to her right lower extremity there are no open wounds at this time she is having some discomfort but I feel like this is more neuropathy than anything. With regard to her left lower extremity she had several areas scattered around that she  does have some weeping and drainage from but again overall she does not appear to be having any significant issues and no evidence of infection at this time which is good news. 03/23/2019 on evaluation today patient appears to be doing well with regard to her right lower extremity which she tells me is still close she is using her juxta light here. Her left lower extremity she mainly just has an area on the foot which is still slightly draining although this also is doing great. Overall very pleased at this time. 04/06/2019 patient appears to be doing a little bit worse in regard to her left lower extremity upon evaluation today. She feels like this could be becoming infected again which she had issues with previous. Fortunately there is no signs of systemic infection but again this is always a struggle with her with her legs she will go from doing well to not so well in a very short amount of time. 04/20/2019 on evaluation today patient actually appears to be doing quite well with regard to her right lower extremity I do not see any signs of active infection at this time. Fortunately there is no fever chills noted. She is still taking the antibiotics which I prescribed for her at this point. In regard to the left lower extremity I do feel like some of these areas are better although again she still is having weeping from several locations at this time. 04/27/2019 on evaluation today patient appears to be doing about the same if not slightly worse in regard to her left lower extremity ulcers. She tells me when questioned that she has been sleeping in her Hoveround chair in fact she tells me she falls asleep without even knowing it. I think she is spending a whole lot of time in the chair and less time walking and moving around which is not good for her legs either. On top of that she is in a seated position which is also the worst position she is not really elevating her legs and she is also not  using her lymphedema pumps. All this is good to contribute to worsening of her condition in general. 05/18/2019 on evaluation today patient appears to be doing well with regard to her lower extremity on the right in fact this is showing no signs of any open wounds at this time. On the left she is continuing to have issues with areas that do drain. Some of the regions have healed and there are couple areas that have reopened. She did go to  the ER per the patient according to recommendations from the home health nurse due to what she was seen when she came out on 05/13/2019. Subsequently she felt like the patient needed to go to the hospital due to the fact that again she was having "milky white discharge" from her leg. Nonetheless she had and then was placed on doxycycline and subsequently seems to be doing better. 06/01/2019 upon evaluation today patient appears to be doing really in my opinion about the same. I do not see any signs of active infection which is good news. Overall she still has wounds over the bilateral lower extremities she has reopened on the right but this appears to be more of a crack where there is weeping/edema coming from the region. I do not see any evidence of infection at either site based on what I visualized today. 07/13/2019 upon evaluation today patient appears to be doing a little worse compared to last time I saw her. She since has been in the hospital from 06/21/2019 through 06/29/2019. This was secondary to having Covid. During that time they did apply lotion to her legs which unfortunately has caused her to develop a myriad of open wounds on her lower extremities. Her legs do appear to be doing better as far as the overall appearance is concerned but nonetheless she does have more open and weeping areas. 07/27/2019 upon evaluation today patient appears to be doing more poorly to be honest in regard to her left lower extremity in particular. There is no signs of  systemic infection although I do believe she may have local infection. She notes she has been having a lot of blue/green drainage which is consistent potentially with Pseudomonas. That may be something that we need to consider here as well. The doxycycline does not seem to have been helping. 08/03/2019 upon evaluation today patient appears to be doing a little better in my opinion compared to last week's evaluation. Her culture I did review today and she is on appropriate medications to help treat the Enterobacter that was noted. Overall I feel like that is good news. With that being said she is unfortunately continuing to have a lot of drainage and though it is doing better I still think she has a long ways to go to get things dried up in general. Fortunately there is no signs of systemic infection. Electronic Signature(s) Signed: 08/03/2019 9:15:12 AM By: [name] PA-C Entered By: [name] on 08/03/2019 09:15:11 -------------------------------------------------------------------------------- Physical Exam Details Patient Name: Date of Service: BRAILYN, KILLION 08/03/2019 8:30 AM Medical Record [id number] Patient Account Number: [id number] Date of Birth/Sex: Treating RN: [id number] (69 y.o. [name] Primary Care Provider: [name] Other Clinician: Referring Provider: Treating Provider/Extender:Stone III, [name], FRED Weeks in Treatment: 39 Constitutional Well-nourished and well-hydrated in no acute distress. Respiratory normal breathing without difficulty. Psychiatric this patient is able to make decisions and demonstrates good insight into disease process. Alert and Oriented x 3. pleasant and cooperative. Notes Patient's wound bed currently appears to be doing excellent at this point with regard to the amount of drainage I feel like has diminished also see no signs of infection I believe the antibiotic, Levaquin, has been beneficial for her.  With that being said she is continuing to have a lot of edema and a lot of weeping as well which again is keeping things very moist around her lower extremity on the left. The dorsal surface of her left foot is also equally macerated  at this point. Electronic Signature(s) Signed: 08/03/2019 9:15:58 AM By: [name] PA-C Entered By: [name] on 08/03/2019 09:15:57 -------------------------------------------------------------------------------- Physician Orders Details Patient Name: Date of Service: [name] 08/03/2019 8:30 AM Medical Record [id number] Patient Account Number: [id number] Date of Birth/Sex: Treating RN: [birth year] (68 y.o. [name] Primary Care Provider: [name] Other Clinician: Referring Provider: Treating Provider/Extender:Stone III, [name], FRED Weeks in Treatment: [phone] Verbal / Phone Orders: No Diagnosis Coding ICD-10 Coding Code Description E11.622 Type 2 diabetes mellitus with other skin ulcer I89.0 Lymphedema, not elsewhere classified I87.331 Chronic venous hypertension (idiopathic) with ulcer and inflammation of right lower extremity I87.332 Chronic venous hypertension (idiopathic) with ulcer and inflammation of left lower extremity L97.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 leg with fat layer exposed L97.822 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 I10 Essential (primary) hypertension E66.01 Morbid (severe) obesity due to excess calories F41.8 Other specified anxiety disorders R53.1 Weakness Follow-up Appointments Return Appointment in 1 week. Dressing Change Frequency Change dressing three times week. - both legs Skin Barriers/Peri-Wound Care Barrier cream - zinc oxide cream to any macerated areas Wound Cleansing Clean wound with Wound Cleanser - all wounds May  shower with protection. Primary Wound Dressing Wound #61 Left,Circumferential Lower Leg Calcium Alginate with Silver Wound #63 Right,Medial Malleolus Calcium Alginate with Silver Wound #64 Left,Dorsal Foot Calcium Alginate with Silver Secondary Dressing Wound #61 Left,Circumferential Lower Leg Dry Gauze - all wounds ABD pad - as needed Zetuvit or Kerramax Wound #63 Right,Medial Malleolus Dry Gauze - all wounds ABD pad - as needed Zetuvit or Kerramax Wound #64 Left,Dorsal Foot Dry Gauze - all wounds ABD pad - as needed Zetuvit or Kerramax - as needed Edema Control 3 Layer Compression System - Bilateral Avoid standing for long periods of time - walking is encouraged Elevate legs to the level of the heart or above for 30 minutes daily and/or when sitting, a frequency of: - do not sleep in chair with feet dangling, MUST elevate legs while sitting Exercise regularly Support Garment 20-30 mm/Hg pressure to: - Juxtalite to right lower leg if unable to tolerate wrap Segmental Compressive Device. - lymphedema pumps 60 minutes 1- 2 times per day Off-Loading Turn and reposition every 2 hours Additional Orders / Instructions Follow Nutritious Diet - To include vitamin A, vitamin C, and Zinc along with increased protein intake. [place] [facility] for wound care. - [facility] Electronic Signature(s) Signed: 08/04/2019 12:37:21 PM By: [name] PA-C Signed: 08/05/2019 6:14:01 PM By: [name] RN, BSN Entered By: [name] on 08/03/2019 09:13:39 -------------------------------------------------------------------------------- Problem List Details Patient Name: Date of Service: [name] 08/03/2019 8:30 AM Medical Record [id number] Patient Account Number: [id number] Date of Birth/Sex: Treating RN: 03-Jul-1950 (69 y.o. [name] Primary Care Provider: [name] Other Clinician: Referring Provider: Treating  Provider/Extender:Stone III, [name], FRED Weeks in Treatment: [phone] Active Problems ICD-10 Evaluated Encounter Code Description Active Date Today Diagnosis E11.622 Type 2 diabetes mellitus with other skin ulcer 08/05/2017 No Yes I89.0 Lymphedema, not elsewhere classified 08/05/2017 No Yes I87.331 Chronic venous hypertension (idiopathic) with ulcer 08/05/2017 No Yes and inflammation of right lower extremity I87.332 Chronic venous hypertension (idiopathic) with ulcer 08/05/2017 No Yes and inflammation of left lower extremity L97.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 08/05/2017 No Yes  leg with fat layer exposed L97.822 Non-pressure chronic ulcer of other part of left lower 08/05/2017 No Yes leg with fat layer exposed L97.522 Non-pressure chronic ulcer of other part of left foot 06/01/2019 No Yes with fat layer exposed I10 Essential (primary) hypertension 08/05/2017 No Yes E66.01 Morbid (severe) obesity due to excess calories 08/05/2017 No Yes F41.8 Other specified anxiety disorders 08/05/2017 No Yes R53.1 Weakness 08/05/2017 No Yes Inactive Problems Resolved Problems Electronic Signature(s) Signed: 08/03/2019 8:36:56 AM By: [name] PA-C Entered By: [name] on 08/03/2019 08:36:56 -------------------------------------------------------------------------------- Progress Note Details Patient Name: Date of Service: [name] 08/03/2019 8:30 AM Medical Record [id number] Patient Account Number: [id number] Date of Birth/Sex: Treating RN: Feb 23, 1951 (68 y.o. [name] Primary Care Provider: Other Clinician: [name] Referring Provider: Treating Provider/Extender:Stone III, [name], FRED Weeks in Treatment: 104 Subjective Chief Complaint Information obtained from Patient Bilateral reoccurring LE ulcers History of Present Illness (HPI) this patient has been seen a couple of times before and returns with recurrent problems to  her right and left lower extremity with swelling and weeping ulcerations due to not wearing her compression stockings which she had been advised to do during her last discharge, at the end of June 2018. During her last visit the patient had had normal arterial blood flow and her venous reflux study did not necessitate any surgical intervention. She was recommended compression and elevation and wound care. After prolonged treatment the patient was completely healed but she has been noncompliant with wearing or compressions.. She was here last week with an outpatient return visit planned but the patient came in a very poor general condition with altered mental status and was rushed to the ER on my request. With a history of hypertension, diabetes, TIA and right-sided weakness she was set up for an MRI on her brain and cervical spine and was sent to [facility]. Getting an MRI done was very difficult but once the workup was done she was found not to have any spinal stenosis, epidural abscess or hematoma or discitis. This was radiculopathy to be treated as an outpatient and she was given a follow-up appointment. Today she is feeling much better alert and oriented and has come to reevaluate her bilateral lower extremity lymphedema and ulceration 03/25/2017 -- she was admitted to the hospital on 03/16/2017 and discharged on 03/18/2017 with left leg cellulitis and ulceration. She was started on vancomycin and Zosyn and x-ray showed no bony involvement. She was treated for a cellulitis with IV antibiotics changed to Rocephin and Flagyl and was discharged on oral Keflex and doxycycline to complete a 7 day course. Last hemoglobin A1c was 7.1 and her other ailments including hypertension got asthma were appropriately treated. 05/06/2017 -- she is awaiting the right size of compression stockings from [place] but other than that has been doing well. ====== Old notes 69 year old patient was seen one time  last October and was lost to follow-up. She has recurrent problems with weeping and ulceration of her left lower extremity and has swelling of this for several years. It has been worse for the last 2 months. Past medical history is significant for diabetes mellitus type 2, hypertension, gout, morbid obesity, depressive disorders, hiatal hernia, migraines, status post knee surgery, risk of a cholecystectomy, vaginal hysterectomy and breast biopsy. She is not a smoker. As noted before she has never had a venous duplex study and an arterial ABI study was attempted but the left lower extremity was noncompressible 10/01/2016 --  had a lower extremity venous duplex reflux evaluation which showed no evidence of deep vein reflux in the right or left lower extremity, and no evidence of great saphenous vein reflux more than 500 ms in the right or left lower extremity, and the left small saphenous vein is incompetent but no vascular consult was recommended. review of her electronic medical records noted that the ABI was checked in July 2017 where the right ABI was normal limits and the left ABI could not be ascertained due to pain with cuff pressure but the waveforms are within normal limits. her arterial duplex study scheduled for April 27. 10/08/2016 -- the patient has various reasons for not having a compression on and for the last 3 days she has had no compression on her left lower extremity either due to pain or the lack of nursing help. She does not use her juxta lites either. 10/15/2016 -- the patient did not keep her appointment for arterial duplex study on April 27 and I have asked her to reschedule this. Her pain is out of proportion with the physical findings and she continuously fails to wear a compression wraps and cuts them off because she says she cannot tolerate the pain. She does not use her juxta lites either. 10/22/2016 -- he has rescheduled her arterial duplex study to May 21 and her  pain today is a bit better. She has not been wearing her juxta lites on her right lower extremity but now understands that she needs to do this. She did tolerate the to press compression wrap on her left lower extremity 10/29/2016 --arterial duplex study is scheduled for next week and overall she has been tolerating her compression wraps and also using her juxta lites on her right lower extremity 11/05/2016 -- the right ABI was 0.95 the left was 1.03. The digit TBI is on the right was 0.83 on the left was 0.92 and she had biphasic flow through these vessels. The impression was that of normal lower extremity arterial study. 11/12/2016 -- her pain is minimal and she is doing very well overall. 11/26/2016 -- she has got juxta lites and her insurance will not pay for additional dual layer compression stockings. She is going to order some from [place]. 05/12/2017 -- her juxta lites are very old and too big for her and these have not been helping with compression. She did get 20-30 mm compression stockings from [place] but she and her husband are unable to put these on. I believe she will benefit from bilateral Extremit-ease, compression stockings and we will measure her for these today. 05/20/2017 -- lymphedema on the left lower extremity has increased a lot and she has a open ulceration as a result of this. The right lower extremity is looking pretty good. She has decided to by the compression stockings herself and will get reimbursed by the home health, at a later date. 05/27/2017 -- her sciatica is bothering her a lot and she thought her left leg pain was caused due to the compression wrap and hence removed it and has significant lymphedema. There is no inflammation on this left lower extremity. 06/17/17 on evaluation today patient appears to be doing very well and in fact is completely healed in regard to her ulcerations. Unfortunately however she does have continued issues with lymphedema  nonetheless. We did order compression garments for her unfortunately she states that the size that she received were large although we ordered medium. Obviously this means she is not getting the optimal  compression. She does not have those with her today and therefore we could not confirm and contact the company on her behalf. Nonetheless she does state that she is going to have her husband bring them by tomorrow so that we can verify and then get in touch with the company. No fevers, chills, nausea, or vomiting noted at this time. Overall patient is doing better otherwise and I'm pleased with the progress she has made. 07/01/17 on evaluation today patient appears to be doing very well in regard to her bilateral lower extremity she does not have any openings at this point which is excellent news. Overall I'm pleased with how things have progressed up to this time. Since she is doing so well we did order her compression which we are seeing her today to ensure that it fits her properly and everything is doing well in that regard and then subsequently she will be discharged. ============ Old Notes: 03/31/16 patient presents today for evaluation concerning open wounds that she has over the left medial ankle region as well as the left dorsal foot. She has previously had this occur although it has been healed for a number of months after having this for about a year prior until her hospitalization on 01/05/16. At that point in time it appears that she was admitted to the hospital for left lower extremity cellulitis and was placed on vancomycin and Zosyn at that point. Eventually upon discharge on January 15, 2016 she was placed on doxycycline at that point in time. Later on 03/27/16 positive wound culture growing Escherichia coli this was switched to amoxicillin. Currently she tells me that she is having pain radiated to be a 7 out of 10 which can be as high as 10 out of 10 with palpation and manipulation  of the wound. This wound appears to be mainly venous in nature due to the bilateral lower extremity venous stasis/lymphedema. This is definitely much worse on her left than the right side. She does have type 1 diabetes mellitus, hypertension, morbid obesity, and is wheelchair dependent.during the course of the hospital stay a blood culture was also obtained and fortunately appeared negative. She also had an x-ray of the tibia/fibula on the left which showed no acute bone abnormality. Her white blood cell count which was performed last on 03/25/16 was 7.3, hemoglobin 12.8, protein 7.1, albumin 3.0. Her urine culture appeared to be negative for any specific organisms. Patient did have a left lower extremity venous duplex evaluation for DVT. This did not include venous reflux studies but fortunately was negative for DVT. Patient also had arterial studies performed which revealed that she had a normal ABI on the right though this was unable to be performed on the left secondary to pain that she was having around the ankle region due to the wound. However it was stated on report that she had biphasic pulses and apparently good blood flow. ========== 06/03/17 she is here in follow-up evaluation for right lower extremity ulcer. The right lower sure he has healed but she has reopened to the left medial malleolus and dorsal foot with weeping. She is waiting for new compression garments to arrive from home health, the previous compression garments were ill fitting. We will continue with compression bilaterally and follow-up in 2 weeks Readmission: 08/05/17 on evaluation today patient appears to be doing somewhat poorly in regard to her left lower extremity especially although the right lower extremity has a small area which may no longer be open. She has  been having a lot of drainage from the left lower extremity however he tells me that she has not been able to use the EXTREMIT- EASE Compression at this  point. She states that she did better and was able to actually apply the Juxta-Lite compression although the wound that she has is too large and therefore really does not compress which is why she cannot wear it at this point. She has no one who can help her put it on regular basis her son can sometimes but he's not able to do it most of the time. I do believe that's why she has begun to weave and have issues as she is currently yet again. No fevers, chills, nausea, or vomiting noted at this time. Patient is no evidence of dementia. 08/12/17 on evaluation today patient appears to still be doing fairly well in regard to the draining areas/weeping areas at this point. With that being said she unfortunately did go to the ER yesterday due to what was felt to be possibly a cellulitis. They place her on doxycycline by mouth and discharge her home. She definitely was not admitted. With that being said she states she has had more discomfort which has been unusual for her even compared to prior times and she's had infections.08/12/17 on evaluation today patient appears to still be doing fairly well in regard to the draining areas/weeping areas at this point. With that being said she unfortunately did go to the ER yesterday due to what was felt to be possibly a cellulitis. They place her on doxycycline by mouth and discharge her home. She definitely was not admitted. With that being said she states she has had more discomfort which has been unusual for her even compared to prior times and she's had infections. 08/19/17 put evaluation today patient tells me that she's been having a lot of what sounds to be neuropathic type pain in regard to her left lower extremity. She has been using over-the-counter topical bins again which some believe. That in order to apply the she actually remove the wrap we put on her last Wednesday on Thursday. Subsequently she has not had anything on compression wise since that time. The  good news is a lot of the weeping areas appear to have closed at this point again I believe she would do better with compression but we are struggling to get her to actually use what she needs to at this point. No fevers, chills, nausea, or vomiting noted at this time. 09/03/17 on evaluation today patient appears to be doing okay in regard to her lower extremities in regard to the lymphedema and weeping. Fortunately she does not seem to show any signs of infection at this point she does have a little bit of weeping occurring in the right medial malleolus area. With that being said this does not appear to be too significant which is good news. 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 She has severe skin damage secondary to both of these features involving the dorsal left foot and medial left ankle and lower leg. Still has open areas in the left anterior foot. The area on the right closed over. She uses her own juxta light stockings. She does not have an arterial issue 09/16/17 on evaluation today patient actually appears to be doing excellent in regard to her bilateral lower extremity swelling. The Juxta-Lite compression wrap seem to be doing very well for her. She has not however  been using the portion that goes over her foot. Her left foot still is draining a little bit not nearly as significant as it has been in the past but still I do believe that she likely needs to utilize the full wrap including the foot portion of this will improve as well. She also has been apparently putting on a significant amount of Vaseline which also think is not helpful for her. I recommended that if she feels she needs something for moisturizer Eucerin will probably be better. 09/30/17 on evaluation today patient presents with several new open areas in regard to her left lower extremity although these appear to be minimal and mainly seem to be more  moisture breakdown than anything. Fortunately she does not seem to have any evidence of infection which is great news. She has been tolerating the dressing changes without complication we are using silver alginate on the foot she has been using AB pads to have the legs and using her Juxta-Lite compression which seems to be controlling her swelling very well. Overall I'm pleased with the poor way she has progressed. 10/14/17 on evaluation today patient appears to be doing better in regard to her left lower extremity areas of weeping. She does still have some discomfort although in general this does not appear to be as macerated and I think it is progressing nicely. I do think she still needs to wear the foot portion of her Juxta-Lite in order to get the most benefit from the wrap obviously. She states she understands. Fortunately there does not appear to be evidence of infection at this time which is great news. 10/28/17 on evaluation today patient appears to be doing excellent in regard to her left lower extremity. She has just a couple areas that are still open and seem to be causing any trouble whatsoever. For that reason I think that she is definitely headed in the right direction the spots are very tiny compared to what we have been dealing with in the past. 11/11/17 on evaluation today patient appears to have a right lateral lower extremity ulcer that has opened since I last saw her. She states this is where the home health nurse that was coming out remove the dressing without wetting the alginate first. Nonetheless I do not know if this is indeed the case or not but more importantly we have not ordered home help to be coming out for her wounds at all. I'm unsure as to why they are coming out and we're gonna have to check on this and get things situated in that regard. With that being said we currently really do not need them to be coming out as the patient has been taking care of her leg herself  without complication and no issues. In fact she was doing much better prior to nursing coming out. 11/25/17 on evaluation today patient actually appears to be doing fairly well in regard to her left lower extremity swelling. In fact she has very little area of weeping at this point there's just a small spot on the lateral portion of her right leg that still has me just a little bit more concerned as far as wanting to see this clear up before I discharge her to caring for this at home. Nonetheless overall she has made excellent progress. 12/09/17 on evaluation today patient appears to be doing rather well in regard to her lower extremity edema. She does have some weeping still in the left lower extremity although the big  area we were taking care of two weeks ago actually has closed and she has another area of weeping on the left lower extremity immediately as well is the top of her foot. She does not currently have lymphedema pumps she has been wearing her compression daily on a regular basis as directed. With that being said I think she may benefit from lymphedema pumps. 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 We had a very difficult time getting and keeping this under control. 12/23/17 on evaluation today patient actually appears to be doing a little bit more poorly in regard to her bilateral lower extremities. She has been tolerating the Juxta-Lite compression wraps. Unfortunately she has two new ulcers on the right lower extremity and left lower [place] ulceration seems to be larger. Obviously this is not good news. She has been tolerating the dressings without complication. 12/30/17 on evaluation today patient actually appears to be doing much better in regard to her bilateral lower extremity edema. She continues to have some issues with  ulcerations and in fact there appears to be one spot on each leg where the wrap may have caused a little bit of a blister which is subsequently opened up at this point is given her pain. Fortunately it does not appear to be any evidence of infection which is good news. No fevers chills noted. 01/13/18 on evaluation today patient appears to be doing rather well in regard to her bilateral lower extremities. The dressings did get kind of stuck as far as the wound beds are concerned but again I think this is mainly due to the fact that she actually seems to be showing signs of healing which is good news. She's not having as much drainage therefore she was having more of the dressing sticking. Nonetheless overall I feel like her swelling is dramatically down compared to previous. 01/20/18 on evaluation today patient unfortunately though she's doing better in most regards has a large blister on the left anterior lower extremity where she is draining quite significantly. Subsequently this is going to need debridement today in order to see what's underneath and ensure she does not continue to trapping fluid at this location. Nonetheless No fevers, chills, nausea, or vomiting noted at this time. 01/27/18 on evaluation today patient appears to be doing rather well at this point in regard to her right lower extremity there's just a very small area that she still has open at this point. With that being said I do believe that she is tolerating the compression wraps very well in making good progress. Home health is coming out at this point to see her. Her left lower extremity on the lateral portion is actually what still mainly open and causing her some discomfort for the most part 02/10/18 on evaluation today patient actually appears to be doing very well in regard to her right lower extremity were all the ulcers appear to be completely close. In regard to the left lower extremity she does have two areas still  open and some leaking from the dorsal surface of her foot but this still seems to be doing much better to me in general. 02/24/18 on evaluation today patient actually appears to be doing much better in regard to her right lower extremity this is still completely healed. Her left lower extremity is also doing much better fortunately  she has no evidence of infection. The one area that is gonna require some debridement is still on the left anterior shin. Fortunately this is not hurting her as badly today. 03/10/18 on evaluation today patient appears to be doing better in some regards although she has a little bit more open area on the dorsal foot and she also has some issues on the medial portion of the left lower extremity which is actually new and somewhat deep. With that being said there fortunately does not appear to be any significant signs of infection which is good news. No fevers, chills, nausea, or vomiting noted at this time. In general her swelling seems to be doing fairly well which is good news. 03/31/18 on evaluation today patient presents for follow-up concerning her left lower extremity lymphedema. Unfortunately she has been doing a little bit more poorly since I last saw her in regard to the amount of weeping that she is experiencing. She's also having some increased pain in the anterior shin location. Unfortunately I do not feel like the patient is making such good progress at this point a few weeks back she was definitely doing much better. 04/07/18 on evaluation today patient actually appears to be showing some signs of improvement as far as the left lower extremity is concerned. She has been tolerating the dressing changes and it does appear that the Drawtex did better for her. With that being said unfortunately home health is stating that they cannot obtain the Drawtex going forward. Nonetheless we're gonna have to check and see what they may be able to get the alginate they  were using was getting stuck in causing new areas of skin being pulled all that with and subsequently weep and calls her to worsen overall this is the first time we've seen improvement at this time. 04/14/18 on evaluation today patient actually appears to be doing rather well at this point there does not appear to be any evidence of infection at this time and she is actually doing excellent in regard to the weeping in fact she almost has no openings remaining even compared to just last week this is a dramatic improvement. No fevers chills noted 04/21/18 evaluation today patient actually appears to be doing very well. She in fact is has a small area on the posterior lower extremity location and she has a small area on the dorsal surface of her foot that are still open both of which are very close to closing. We're hoping this will be close shortly. She brought her Juxta-Lite wrap with her today hoping that would be able to put her in it unfortunately I don't think were quite at that point yet but we're getting closer. 04/28/18 upon evaluation today patient actually appears to be doing excellent in regard to her left lower extremity ulcer. In fact the region on the posterior lower extremity actually is much smaller than previously noted. Overall I'm very happy with the progress she has made. She again did bring her Juxta-Lite although we're not quite ready for that yet. 05/11/18 upon evaluation today patient actually appears to be doing in general fairly well in regard to her left lower Trinity. The swelling is very well controlled. With that being said she has a new area on the left anterior lower extremity as well as between the first and second toes of her left foot that was not present during the last evaluation. The region of her posterior left lower extremity actually appears to be almost completely  healed. To be honest I'm very pleased with the way that stands. Nonetheless I do believe that  the lotion may be keeping the area to moist as far as her legs are concerned subsequently I'm gonna consider discontinuing that today. 05/26/18 on evaluation today patient appears to be doing rather well in regard to her left lower should be ulcers. In fact everything appears to be close except for a very small area on the left posterior lower extremity. Fortunately there does not appear to be any evidence of infection at this time. Overall very pleased with her progress. 06/02/18 and evaluation today patient actually appears to be doing very well in regard to her lower extremity ulcers. She has one small area that still continues to weep that I think may benefit her being able to justify lotion and user Juxta-Lite wraps versus continued to wrap her. Nonetheless I think this is something we can definitely look into at this point. 06/23/18 on evaluation today patient unfortunately has openings of her bilateral lower extremities. In general she seems to be doing much worse than when I last saw her just as far as her overall health standpoint is concerned. She states that her discomfort is mainly due to neuropathy she's not having any other issues otherwise. No fevers, chills, nausea, or vomiting noted at this time. 06/30/18 on evaluation today patient actually appears to be doing a little worse in regard to her right lower extremity her left lower extremity of doing fairly well. Fortunately there is no sign of infection at this time. She has been tolerating the dressing changes without complication. Home health did not come out like they were supposed to for the appropriate wrap changes. They stated that they never received the orders from [place] which were fax. Nonetheless we will send a copy of the orders with the patient today as well. 07/07/18 on evaluation today patient appears to be doing much better in regard to lower extremities. She still has several openings bilaterally although since I last saw  her her legs did show obvious signs of infection when she later saw her nurse. Subsequently a culture was obtained and she is been placed on Bactrim and Keflex. Fortunately things seem to be looking much better it does appear she likely had an infection. Again last week we'd even discussed it but again there really was not any obvious sign that she had infection therefore we held off on the antibiotics. Nonetheless I'm glad she's doing better today. 07/14/18 on evaluation today patient appears to be doing much better regarding her bilateral lower Trinity's. In fact on the right lower for me there's nothing open at this point there are some dry skin areas at the sites where she had infection. Fortunately there is no evidence of systemic infection which is excellent news. No fevers chills noted 07/21/18 on evaluation today patient actually appears to be doing much better in regard to her left lower extremity ulcers. She is making good progress and overall I feel like she's improving each time I see her. She's having no pain I do feel like the infection is completely resolved which is excellent news. No fevers, chills, nausea, or vomiting noted at this time. 07/28/18 on evaluation today patient appears to be doing very well in regard to her left lower [employer]. Everything seems to be showing signs of improvement which is excellent news. Overall very pleased with the progress that has been made. Fortunately there's no evidence of active infection at this time  also excellent news. 08/04/18 on evaluation today patient appears to be doing more poorly in regard to her bilateral lower extremities. She has two new areas open up on the right and these were completely closed as of last week. She still has the two spots on the left which in my pinion seem to be doing better. Fortunately there's no evidence of infection again at this point. 08/11/18 on evaluation today patient actually appears to be doing very  well in regard to her bilateral lower Trinity wounds that all seem to be doing better and are measures smaller today. Fortunately there's no signs of infection. No fevers, chills, nausea, or vomiting noted at this time. 08/18/18 on evaluation today patient actually appears to be doing about the same inverter bilateral lower extremities. She continues to have areas that blistering open as was drain that fortunately nothing too significant. Overall I feel like Drawtex may have done better for her however compared to the collagen. 08/25/18 on evaluation today patient appears to be doing a little bit more poorly today even compared to last time I saw her. Again I'm not exactly sure why she's making worse progress over the past several weeks. I'm beginning to wonder if there is some kind of underlying low level infection causing this issue. I did actually take a culture from the left anterior lower extremity but it was a new wound draining quite a bit at this point. Unfortunately she also seems to be having more pain which is what also makes me worried about the possibility of infection. This is despite never erythema noted at this point. 09/01/18 on evaluation today patient actually appears to be doing a little worse even compared to last week in regard to bilateral lower extremities. She did go to the hospital on the 16th was given a dose of IV Zosyn and then discharged with a recommendation to continue with the Bactrim that I previously prescribed for her. Nonetheless she is still having a lot of discomfort she tells me as well at this time. This is definitely unfortunate. No fevers, chills, nausea, or vomiting noted at this time. 09/08/18 on evaluation today patient's bilateral lower extremities actually appear to be shown signs of improvement which is good news. Fortunately there does not appear to be any signs of active infection I think the anabiotic is helping in this regard. Overall I'm very  pleased with how she is progressing. 09/15/18 patient was actually seen in ER yesterday due to her legs as well unfortunately. She states she's been having a lot of pain and discomfort as well as a lot of drainage. Upon inspection today the patient does have a lot of swelling and drainage I feel like this is more related to lymphedema and poor fluid control than it is to infection based on what I'm seeing. The physician in the emergency department also doubted that the patient was having a significant infection nonetheless I see no evidence of infection obvious at this point although I do see evidence of poor fluid control. She still not using a compression pumps, she is not elevating due to her lift chair as well as her hospital bed being broken, and she really is not keeping her legs up as much as they should be and also has been taking off her wraps. All this combined I think has led to poor fluid control and to be honest she may be somewhat volume overloaded in general as well. I recommend that she may need to contact  your physician to see if a prescription for a diuretic would be beneficial in their opinion. As long as this is safe I think it would likely help her. 09/29/18 on evaluation today patient's left lower extremity actually appears to be doing quite a bit better. At least compared to last time that I saw her. She still has a large area where she is draining from but there's a lot of new skin speckled trout and in fact there's more new skin that there are open areas of weeping and drainage at this point. This is good news. With regard to the right lower extremity this is doing much better with the only open area that I really see being a dry spot on the right lateral ankle currently. Fortunately there's no signs of active infection at this time which is good news. No fevers, chills, nausea, or vomiting noted at this time. The patient seems somewhat stressed and overwhelmed during the visit  today she was very lethargic as such. She does and she is not taking any pain medications at this point. Apparently according to her husband are also in the process of moving which is probably taking its toll on her as well. 10/06/18 on evaluation today patient appears to be doing rather well in regard to her lower extremities compared to last evaluation. Fortunately there's no signs of active infection. She tells me she did have an appointment with her primary. Nonetheless he was concerned that the wounds were somewhat deep based on pictures but we never actually saw her legs. She states that he had her somewhat worried due to the fact that she was fearing now that she was [place] have to have an amputation. With that being said based on what I'm seeing check she looks better this week that she has the last two times I've seen her with much less drainage I'm actually pleased in this regard. That doesn't mean that she's out of the water but again I do not think what the point of talking about education at all in regard to her leg. She is very happy to hear this. She is also not having as much pain as she was having last week. 10/13/18 unfortunately on evaluation today patient still continues to have a significant amount of drainage she's not letting home health actually apply the compression dressings at this point. She's trying to use of Juxta-Lite of the top of Kerlex and the second layer of the three layer compression wrap. With that being said she just does not seem to be making as good a progress as I would expect if she was having the compression applied and in place on a regular basis. No fevers, chills, nausea, or vomiting noted at this time. 10/20/18 on evaluation today patient appears to be doing a little better in regard to her bilateral lower extremity ulcers. In fact the right lower extremity seems to be healed she doesn't even have any openings at this point left lower extremity though  still somewhat macerated seems to be showing signs of new skin growth at multiple locations throughout. Fortunately there's no evidence of active infection at this time. No fevers, chills, nausea, or vomiting noted at this time. 10/27/18 on evaluation today patient appears to be doing much better in regard to her left lower Trinity ulcer. She's been tolerating the laptop complication and has minimal drainage noted at this point. Fortunately there's no signs of active infection at this time. No fevers, chills, nausea, or vomiting noted  at this time. 11/03/18 on evaluation today patient actually appears to be doing excellent in regard to her left lower extremity. She is having very little drainage at this point there does not appear to be any significant signs of infection overall very pleased with how things have gone. She is likewise extremely pleased still and seems to be making wonderful progress week to week. I do believe antibiotics were helpful for her. Her primary care provider did place on amateur clean since I last saw her. 11/17/18 on evaluation today patient appears to be doing worse in regard to her bilateral lower extremities at this point. She is been tolerating the dressing changes without complication. With that being said she typically takes the Coban off fairly quickly upon arriving home even after being seen here in the clinic and does not allow home health reapply command as part of the dressing at home. Therefore she said no compression essentially since I last saw her as best I can tell. With that being said I think it shows and how much swelling she has in the open wounds that are noted at this point. Fortunately there's no signs of infection but unfortunately if she doesn't get this under control I think she will end up with infection and more significant issues. 11/24/18 on evaluation today patient actually appears to be doing somewhat better in regard to her bilateral  lower extremities. 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 Nonetheless that is doing better currently so she should be able to attempt reinitiating the lymphedema pumps at this time. No fevers, chills, nausea, or vomiting noted at this time. 12/01/18 upon evaluation today patient's left lower extremity appears to be doing quite well unfortunately her right lower extremity is not doing nearly as well. She has been tolerating the dressing changes without complication unfortunately she did not keep a wrap on the right at this time. Nonetheless I believe this has led to increased swelling and weeping in the world is actually much larger than during the last evaluation with her. 12/08/18 on evaluation today patient appears to be doing about the same at this point in regard to her right lower extremity. There is some more palatable to touch I'm concerned about the possibility of there being some infection although I think the main issue is she's not keeping her compression wrap on which in turn is not allowing this area to heal appropriately. 12/22/18 on evaluation today patient appears to be doing better in regard to left lower extremity unfortunately significantly worse in regard to the right lower extremity. The areas of blistering and necrotic superficial tissue have spread and again this does not really appear to be signs of infection and all she just doesn't seem to be doing nearly as well is what she has been in the past. Overall I feel like the Augmentin did absolutely nothing for her she doesn't seem to have any infection again I really didn't think so last time either is more of a potential preventative measure and hoping that this would make some difference but I think the main issue is she's not wearing her compression. She tells me she cannot wear  the [place] been we put on she takes it off pretty much upon getting home. Subsequently she worshiped Juxta-Lite when I questioned her about how  often she wears it this is no more than three hours a day obviously that leaves 21 hours that she has no compression and this is obviously not doing well for her. Overall I'm concerned that if things continue to worsen she is at great risk of both infection as well as losing her leg. 01/05/19 on evaluation today patient appears to be doing well in regard to her left lower extremity which he is allowing [place] to wrap and not so well with regard to her right lower extremity which she is not allowing [place] to really wrap and keep the wrap on. She states that it hurts too badly whenever it's wrapped and she ends up having to take it off. She's been using the Juxta-Lite she tells me up to six hours a day although I question whether or not that's really been the case to be honest. Previously she told me three hours today nonetheless obviously the legs as long as the wrap is doing great when she is not is doing much more poorly. 01/12/2019 on evaluation today patient actually appears to be doing a little better in my opinion with regard to her right lower extremity ulcer. She has a small open area on the left lower extremity unfortunately but again this I think is part of the normal fluctuation of what she is going to have to expect with regard to her legs especially when she is not using her lymphedema pumps on a regular basis. Subsequently based on what I am seeing today I think that she does seem to be doing slightly better with regard to her right lower extremity she did see her primary care provider on Monday they felt she had an infection and placed her on 2 antibiotics. Both Cipro and clindamycin. Subsequently again she seems possibly to be doing a little bit better in regards to the right lower extremity she also tells me however she has been wearing the  compression wrap over the past week since I spoke with her as well that is a Kerlix and Coban wrap on the right. No fevers, chills, nausea, vomiting, or diarrhea. 01/19/2019 on evaluation today patient appears to be doing better with regard to her bilateral lower extremities especially the right. I feel like the compression has been beneficial for her which is great news. She did get a call from her primary care provider on her way here today telling her that she did have methicillin-resistant Staphylococcus aureus and he was calling in a couple new antibiotics for her including a ointment to be applied she tells me 3 times a day. With that being said this sounds like likely to be Bactroban which I think could be applied with each dressing/wrap change but I would not be able to accommodate her applying this 3 times a day. She is in agreement with the least doing this we will add that to her orders today. 01/26/2019 on evaluation today patient actually appears to be doing much better with regard to her right lower extremity. Her left lower extremity is also doing quite well all things considering. Fortunately there is no evidence of active infection at this time. No fevers, chills, nausea, vomiting, or diarrhea. 02/02/2019 on evaluation today patient appears to be doing much better compared to her last evaluation. Little by little off like her right leg is returning more towards normal. There does not appear to be any signs of active infection and overall she seems to be doing quite well which is great  news. I am very pleased in this regard. No fevers, chills, nausea, vomiting, or diarrhea. 02/09/2019 upon evaluation today patient appears to be doing better with regard to her bilateral lower extremities. She has been tolerating the dressing changes without complication. Fortunately there is no signs of active infection at this time. No fevers, chills, nausea, vomiting, or diarrhea. 02/23/2019 on  evaluation today patient actually appears to be doing quite well with regard to her bilateral lower extremities. She has been tolerating the dressing changes without complication. She is even used her pumps one time and states that she really felt like it felt good. With that being said she seems to be in good spirits and her legs appear to be doing excellent. 03/09/2019 on evaluation today patient appears to be doing well with regard to her right lower extremity there are no open wounds at this time she is having some discomfort but I feel like this is more neuropathy than anything. With regard to her left lower extremity she had several areas scattered around that she does have some weeping and drainage from but again overall she does not appear to be having any significant issues and no evidence of infection at this time which is good news. 03/23/2019 on evaluation today patient appears to be doing well with regard to her right lower extremity which she tells me is still close she is using her juxta light here. Her left lower extremity she mainly just has an area on the foot which is still slightly draining although this also is doing great. Overall very pleased at this time. 04/06/2019 patient appears to be doing a little bit worse in regard to her left lower extremity upon evaluation today. She feels like this could be becoming infected again which she had issues with previous. Fortunately there is no signs of systemic infection but again this is always a struggle with her with her legs she will go from doing well to not so well in a very short amount of time. 04/20/2019 on evaluation today patient actually appears to be doing quite well with regard to her right lower extremity I do not see any signs of active infection at this time. Fortunately there is no fever chills noted. She is still taking the antibiotics which I prescribed for her at this point. In regard to the left lower extremity I do  feel like some of these areas are better although again she still is having weeping from several locations at this time. 04/27/2019 on evaluation today patient appears to be doing about the same if not slightly worse in regard to her left lower extremity ulcers. She tells me when questioned that she has been sleeping in her Hoveround chair in fact she tells me she falls asleep without even knowing it. I think she is spending a whole lot of time in the chair and less time walking and moving around which is not good for her legs either. On top of that she is in a seated position which is also the worst position she is not really elevating her legs and she is also not using her lymphedema pumps. All this is good to contribute to worsening of her condition in general. 05/18/2019 on evaluation today patient appears to be doing well with regard to her lower extremity on the right in fact this is showing no signs of any open wounds at this time. On the left she is continuing to have issues with areas that do drain. Some of  the regions have healed and there are couple areas that have reopened. She did go to the ER per the patient according to recommendations from the home health nurse due to what she was seen when she came out on 05/13/2019. Subsequently she felt like the patient needed to go to the hospital due to the fact that again she was having "milky white discharge" from her leg. Nonetheless she had and then was placed on doxycycline and subsequently seems to be doing better. 06/01/2019 upon evaluation today patient appears to be doing really in my opinion about the same. I do not see any signs of active infection which is good news. Overall she still has wounds over the bilateral lower extremities she has reopened on the right but this appears to be more of a crack where there is weeping/edema coming from the region. I do not see any evidence of infection at either site based on what I visualized  today. 07/13/2019 upon evaluation today patient appears to be doing a little worse compared to last time I saw her. She since has been in the hospital from 06/21/2019 through 06/29/2019. This was secondary to having Covid. During that time they did apply lotion to her legs which unfortunately has caused her to develop a myriad of open wounds on her lower extremities. Her legs do appear to be doing better as far as the overall appearance is concerned but nonetheless she does have more open and weeping areas. 07/27/2019 upon evaluation today patient appears to be doing more poorly to be honest in regard to her left lower extremity in particular. There is no signs of systemic infection although I do believe she may have local infection. She notes she has been having a lot of blue/green drainage which is consistent potentially with Pseudomonas. That may be something that we need to consider here as well. The doxycycline does not seem to have been helping. 08/03/2019 upon evaluation today patient appears to be doing a little better in my opinion compared to last week's evaluation. Her culture I did review today and she is on appropriate medications to help treat the Enterobacter that was noted. Overall I feel like that is good news. With that being said she is unfortunately continuing to have a lot of drainage and though it is doing better I still think she has a long ways to go to get things dried up in general. Fortunately there is no signs of systemic infection. Objective Constitutional Well-nourished and well-hydrated in no acute distress. Vitals Time Taken: 8:21 AM, Height: 62 in, Weight: 335 lbs, BMI: 61.3, Temperature: 98.5 F, Pulse: 103 bpm, Respiratory Rate: 18 breaths/min, Blood Pressure: 142/76 mmHg. Respiratory normal breathing without difficulty. Psychiatric this patient is able to make decisions and demonstrates good insight into disease process. Alert and Oriented x 3. pleasant and  cooperative. General Notes: Patient's wound bed currently appears to be doing excellent at this point with regard to the amount of drainage I feel like has diminished also see no signs of infection I believe the antibiotic, Levaquin, has been beneficial for her. With that being said she is continuing to have a lot of edema and a lot of weeping as well which again is keeping things very moist around her lower extremity on the left. The dorsal surface of her left foot is also equally macerated at this point. Integumentary (Hair, Skin) Wound #61 status is Open. Original cause of wound was Gradually Appeared. The wound is located on the Left,Circumferential Lower  Leg. The wound measures 18.7cm length x 27.5cm width x 0.1cm depth; 403.891cm^2 area and 40.389cm^3 volume. There is Fat Layer (Subcutaneous Tissue) Exposed exposed. There is no tunneling or undermining noted. There is a large amount of purulent drainage noted. The wound margin is flat and intact. There is no granulation within the wound bed. There is a large (67-100%) amount of necrotic tissue within the wound bed including Adherent Slough. General Notes: macerated left leg. Wound #63 status is Open. Original cause of wound was Gradually Appeared. The wound is located on the Right,Medial Malleolus. The wound measures 1.4cm length x 2.5cm width x 0.1cm depth; 2.749cm^2 area and 0.275cm^3 volume. There is Fat Layer (Subcutaneous Tissue) Exposed exposed. There is no tunneling or undermining noted. There is a medium amount of serosanguineous drainage noted. The wound margin is well defined and not attached to the wound base. There is large (67-100%) pale granulation within the wound bed. There is a small (1-33%) amount of necrotic tissue within the wound bed including Adherent Slough. Wound #64 status is Open. Original cause of wound was Gradually Appeared. The wound is located on the Left,Dorsal Foot. The wound measures 10cm length x 8.4cm  width x 0.1cm depth; 65.973cm^2 area and 6.597cm^3 volume. There is Fat Layer (Subcutaneous Tissue) Exposed exposed. There is no tunneling or undermining noted. There is a medium amount of purulent drainage noted. The wound margin is flat and intact. There is no granulation within the wound bed. There is a large (67-100%) amount of necrotic tissue within the wound bed including Adherent Slough. General Notes: macerated left foot. 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 There was a Three Layer Compression Therapy Procedure by Carlene Coria, RN. Post procedure Diagnosis Wound #61: Same as Pre-Procedure Wound #63 Pre-procedure diagnosis of Wound #63 is a Venous Leg Ulcer located on the Right,Medial Malleolus . There was a Three Layer Compression Therapy Procedure by Carlene Coria, RN. Post procedure Diagnosis Wound #63: Same as Pre-Procedure Plan Follow-up Appointments: Return Appointment in 1 week. Dressing Change Frequency: Change dressing three times week. - both legs Skin Barriers/Peri-Wound Care: Barrier cream - zinc oxide cream to any macerated areas Wound Cleansing: Clean wound with Wound Cleanser - all wounds May shower with  protection. Primary Wound Dressing: Wound #61 Left,Circumferential Lower Leg: Calcium Alginate with Silver Wound #63 Right,Medial Malleolus: Calcium Alginate with Silver Wound #64 Left,Dorsal Foot: Calcium Alginate with Silver Secondary Dressing: Wound #61 Left,Circumferential Lower Leg: Dry Gauze - all wounds ABD pad - as needed Zetuvit or Kerramax Wound #63 Right,Medial Malleolus: Dry Gauze - all wounds ABD pad - as needed Zetuvit or Kerramax Wound #64 Left,Dorsal Foot: Dry Gauze - all wounds ABD pad - as needed Zetuvit or Kerramax - as needed Edema Control: 3 Layer Compression System - Bilateral Avoid standing for long periods of time - walking is encouraged Elevate legs  to the level of the heart or above for 30 minutes daily and/or when sitting, a frequency of: - do not sleep in chair with feet dangling, MUST elevate legs while sitting Exercise regularly Support Garment 20-30 mm/Hg pressure to: - Juxtalite to right lower leg if unable to tolerate wrap Segmental Compressive Device. - lymphedema pumps 60 minutes 1- 2 times per day Off-Loading: Turn and reposition every 2 hours Additional Orders / Instructions: Follow Nutritious Diet - To include vitamin A, vitamin C, and Zinc along with increased protein intake. Home Health: [facility] for [facility]. - [facility]. I would recommend that we continue currently with the alginate dressings although I am can recommend zinc over the open areas to try to protect things here. 2. I am also going to suggest that we continue to utilize the 3 layer compression wrap I think try to keep the edema under best control is good and again I think the wrap is beneficial in this regard. 3. I am also going to recommend the patient really needs to be elevating her legs as well as using her lymphedema pumps again I am not sure that she is using the pumps at all at the moment. We will see patient back for reevaluation in  1 week here in the clinic. If anything worsens or changes patient will contact our office for additional recommendations. Electronic Signature(s) Signed: 08/03/2019 9:16:38 AM By: [name] PA-C Entered By: [name] on 08/03/2019 09:16:38 -------------------------------------------------------------------------------- SuperBill Details Patient Name: Date of Service: [name] 08/03/2019 Medical Record [id number] Patient Account Number: [id number] Date of Birth/Sex: Treating RN: 10/16/1950 (68 y.o. [name] Primary Care Provider: [name] Other Clinician: Referring Provider: Treating Provider/Extender:Stone III, [name], FRED Weeks in Treatment: 104 Diagnosis Coding ICD-10 Codes Code Description E11.622 Type 2 diabetes mellitus with other skin ulcer I89.0 Lymphedema, not elsewhere classified I87.331 Chronic venous hypertension (idiopathic) with ulcer and inflammation of right lower extremity I87.332 Chronic venous hypertension (idiopathic) with ulcer and inflammation of left lower extremity L97.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 leg with fat layer exposed L97.822 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 I10 Essential (primary) hypertension E66.01 Morbid (severe) obesity due to excess calories F41.8 Other specified anxiety disorders R53.1 Weakness Facility Procedures CPT4: Description Modifier Quantity Code [id number] BILATERAL: Application of multi-layer venous compression system; leg 1 (below knee), including ankle and foot. Physician Procedures CPT4 Code Description: [id number] - WC PHYS LEVEL 4 - EST PT ICD-10 Diagnosis Description E11.622 Type 2 diabetes mellitus with other skin ulcer I89.0 Lymphedema, not elsewhere classified I87.331 Chronic venous hypertension (idiopathic) with  ulcer and  inf lower extremity I87.332 Chronic venous hypertension (idiopathic) with ulcer and inf extremity Modifier: lammation of rig lammation of lef Quantity: 1 ht t lower Electronic Signature(s) Signed: 08/03/2019 9:16:55 AM By: [name] PA-C Entered By: [name] on 08/03/2019 09:16:54

## 2019-08-10 ENCOUNTER — Encounter (HOSPITAL_BASED_OUTPATIENT_CLINIC_OR_DEPARTMENT_OTHER): Payer: Medicare PPO | Admitting: Physician Assistant

## 2019-08-10 ENCOUNTER — Other Ambulatory Visit: Payer: Self-pay

## 2019-08-10 DIAGNOSIS — E11622 Type 2 diabetes mellitus with other skin ulcer: Secondary | ICD-10-CM | POA: Diagnosis not present

## 2019-08-10 NOTE — Progress Notes (Addendum)
[name], [name] ([id number]) Visit Report for 08/10/2019 Chief Complaint Document Details Patient Name: Date of Service: [name], [name] 08/10/2019 10:00 AM Medical Record [id number] Patient Account Number: [id number] Date of Birth/Sex: Treating RN: 1951/03/01 (69 y.o. [name] Primary Care Provider: [name] Other Clinician: Referring Provider: Treating Provider/Extender:Stone III, [name], FRED Weeks in Treatment: 105 Information Obtained from: Patient Chief Complaint Bilateral reoccurring LE ulcers Electronic Signature(s) Signed: 08/10/2019 10:52:12 AM By: [name] PA-C Entered By: [name] on 08/10/2019 10:52:11 -------------------------------------------------------------------------------- HPI Details Patient Name: Date of Service: [name]. 08/10/2019 10:00 AM Medical Record [id number] Patient Account Number: [id number] Date of Birth/Sex: Treating RN: 1951-06-09 (68 y.o. [name] Primary Care Provider: [name] Other Clinician: Referring Provider: Treating Provider/Extender:Stone III, [name], FRED Weeks in Treatment: 105 History of Present Illness HPI Description: this patient has been seen a couple of times before and returns with recurrent problems to her right and left lower extremity with swelling and weeping ulcerations due to not wearing her compression stockings which she had been advised to do during her last discharge, at the end of June 2018. During her last visit the patient had had normal arterial blood flow and her venous reflux study did not necessitate any surgical intervention. She was recommended compression and elevation and wound care. After prolonged treatment the patient was completely healed but she has been noncompliant with wearing or compressions.. She was here last week with an outpatient return visit planned but the patient came in a very poor general condition with altered  mental status and was rushed to the ER on my request. With a history of hypertension, diabetes, TIA and right-sided weakness she was set up for an MRI on her brain and cervical spine and was sent to [facility]. Getting an MRI done was very difficult but once the workup was done she was found not to have any spinal stenosis, epidural abscess or hematoma or discitis. This was radiculopathy to be treated as an outpatient and she was given a follow-up appointment. Today she is feeling much better alert and oriented and has come to reevaluate her bilateral lower extremity lymphedema and ulceration 03/25/2017 -- she was admitted to the hospital on 03/16/2017 and discharged on 03/18/2017 with left leg cellulitis and ulceration. She was started on vancomycin and Zosyn and x-ray showed no bony involvement. She was treated for a cellulitis with IV antibiotics changed to Rocephin and Flagyl and was discharged on oral Keflex and doxycycline to complete a 7 day course. Last hemoglobin A1c was 7.1 and her other ailments including hypertension got asthma were appropriately treated. 05/06/2017 -- she is awaiting the right size of compression stockings from [facility] but other than that has been doing well. ====== Old notes 69 year old patient was seen one time last October and was lost to follow-up. She has recurrent problems with weeping and ulceration of her left lower extremity and has swelling of this for several years. It has been worse for the last 2 months. Past medical history is significant for diabetes mellitus type 2, hypertension, gout, morbid obesity, depressive disorders, hiatal hernia, migraines, status post knee surgery, risk of a cholecystectomy, vaginal hysterectomy and breast biopsy. She is not a smoker. As noted before she has never had a venous duplex study and an arterial ABI study was attempted but the left lower extremity was noncompressible 10/01/2016 -- had a lower extremity venous  duplex reflux evaluation which showed no evidence of deep vein reflux  in the right or left lower extremity, and no evidence of great saphenous vein reflux more than 500 ms in the right or left lower extremity, and the left small saphenous vein is incompetent but no vascular consult was recommended. review of her electronic medical records noted that the ABI was checked in July 2017 where the right ABI was normal limits and the left ABI could not be ascertained due to pain with cuff pressure but the waveforms are within normal limits. her arterial duplex study scheduled for April 27. 10/08/2016 -- the patient has various reasons for not having a compression on and for the last 3 days she has had no compression on her left lower extremity either due to pain or the lack of nursing help. She does not use her juxta lites either. 10/15/2016 -- the patient did not keep her appointment for arterial duplex study on April 27 and I have asked her to reschedule this. Her pain is out of proportion with the physical findings and she continuously fails to wear a compression wraps and cuts them off because she says she cannot tolerate the pain. She does not use her juxta lites either. 10/22/2016 -- he has rescheduled her arterial duplex study to May 21 and her pain today is a bit better. She has not been wearing her juxta lites on her right lower extremity but now understands that she needs to do this. She did tolerate the to press compression wrap on her left lower extremity 10/29/2016 --arterial duplex study is scheduled for next week and overall she has been tolerating her compression wraps and also using her juxta lites on her right lower extremity 11/05/2016 -- the right ABI was 0.95 the left was 1.03. The digit TBI is on the right was 0.83 on the left was 0.92 and she had biphasic flow through these vessels. The impression was that of normal lower extremity arterial study. 11/12/2016 -- her pain is  minimal and she is doing very well overall. 11/26/2016 -- she has got juxta lites and her insurance will not pay for additional dual layer compression stockings. She is going to order some from [facility]. 05/12/2017 -- her juxta lites are very old and too big for her and these have not been helping with compression. She did get 20-30 mm compression stockings from [place] but she and her husband are unable to put these on. I believe she will benefit from bilateral Extremit-ease, compression stockings and we will measure her for these today. 05/20/2017 -- lymphedema on the left lower extremity has increased a lot and she has a open ulceration as a result of this. The right lower extremity is looking pretty good. She has decided to by the compression stockings herself and will get reimbursed by the home health, at a later date. 05/27/2017 -- her sciatica is bothering her a lot and she thought her left leg pain was caused due to the compression wrap and hence removed it and has significant lymphedema. There is no inflammation on this left lower extremity. 06/17/17 on evaluation today patient appears to be doing very well and in fact is completely healed in regard to her ulcerations. Unfortunately however she does have continued issues with lymphedema nonetheless. We did order compression garments for her unfortunately she states that the size that she received were large although we ordered medium. Obviously this means she is not getting the optimal compression. She does not have those with her today and therefore we could not confirm and contact  the company on her behalf. Nonetheless she does state that she is going to have her husband bring them by tomorrow so that we can verify and then get in touch with the company. No fevers, chills, nausea, or vomiting noted at this time. Overall patient is doing better otherwise and I'm pleased with the progress she has made. 07/01/17 on evaluation today patient  appears to be doing very well in regard to her bilateral lower extremity she does not have any openings at this point which is excellent news. Overall I'm pleased with how things have progressed up to this time. Since she is doing so well we did order her compression which we are seeing her today to ensure that it fits her properly and everything is doing well in that regard and then subsequently she will be discharged. ============ Old Notes: 03/31/16 patient presents today for evaluation concerning open wounds that she has over the left medial ankle region as well as the left dorsal foot. She has previously had this occur although it has been healed for a number of months after having this for about a year prior until her hospitalization on 01/05/16. At that point in time it appears that she was admitted to the hospital for left lower extremity cellulitis and was placed on vancomycin and Zosyn at that point. Eventually upon discharge on January 15, 2016 she was placed on doxycycline at that point in time. Later on 03/27/16 positive wound culture growing Escherichia coli this was switched to amoxicillin. Currently she tells me that she is having pain radiated to be a 7 out of 10 which can be as high as 10 out of 10 with palpation and manipulation of the wound. This wound appears to be mainly venous in nature due to the bilateral lower extremity venous stasis/lymphedema. This is definitely much worse on her left than the right side. She does have type 1 diabetes mellitus, hypertension, morbid obesity, and is wheelchair dependent.during the course of the hospital stay a blood culture was also obtained and fortunately appeared negative. She also had an x-ray of the tibia/fibula on the left which showed no acute bone abnormality. Her white blood cell count which was performed last on 03/25/16 was 7.3, hemoglobin 12.8, protein 7.1, albumin 3.0. Her urine culture appeared to be negative for any specific  organisms. Patient did have a left lower extremity venous duplex evaluation for DVT. This did not include venous reflux studies but fortunately was negative for DVT. Patient also had arterial studies performed which revealed that she had a normal ABI on the right though this was unable to be performed on the left secondary to pain that she was having around the ankle region due to the wound. However it was stated on report that she had biphasic pulses and apparently good blood flow. ========== 06/03/17 she is here in follow-up evaluation for right lower extremity ulcer. The right lower sure he has healed but she has reopened to the left medial malleolus and dorsal foot with weeping. She is waiting for new compression garments to arrive from home health, the previous compression garments were ill fitting. We will continue with compression bilaterally and follow-up in 2 weeks Readmission: 08/05/17 on evaluation today patient appears to be doing somewhat poorly in regard to her left lower extremity especially although the right lower extremity has a small area which may no longer be open. She has been having a lot of drainage from the left lower extremity however he tells me that  she has not been able to use the EXTREMIT- EASE Compression at this point. She states that she did better and was able to actually apply the Juxta-Lite compression although the wound that she has is too large and therefore really does not compress which is why she cannot wear it at this point. She has no one who can help her put it on regular basis her son can sometimes but he's not able to do it most of the time. I do believe that's why she has begun to weave and have issues as she is currently yet again. No fevers, chills, nausea, or vomiting noted at this time. Patient is no evidence of dementia. 08/12/17 on evaluation today patient appears to still be doing fairly well in regard to the draining areas/weeping areas at  this point. With that being said she unfortunately did go to the ER yesterday due to what was felt to be possibly a cellulitis. They place her on doxycycline by mouth and discharge her home. She definitely was not admitted. With that being said she states she has had more discomfort which has been unusual for her even compared to prior times and she's had infections.08/12/17 on evaluation today patient appears to still be doing fairly well in regard to the draining areas/weeping areas at this point. With that being said she unfortunately did go to the ER yesterday due to what was felt to be possibly a cellulitis. They place her on doxycycline by mouth and discharge her home. She definitely was not admitted. With that being said she states she has had more discomfort which has been unusual for her even compared to prior times and she's had infections. 08/19/17 put evaluation today patient tells me that she's been having a lot of what sounds to be neuropathic type pain in regard to her left lower extremity. She has been using over-the-counter topical bins again which some believe. That in order to apply the she actually remove the wrap we put on her last Wednesday on Thursday. Subsequently she has not had anything on compression wise since that time. The good news is a lot of the weeping areas appear to have closed at this point again I believe she would do better with compression but we are struggling to get her to actually use what she needs to at this point. No fevers, chills, nausea, or vomiting noted at this time. 09/03/17 on evaluation today patient appears to be doing okay in regard to her lower extremities in regard to the lymphedema and weeping. Fortunately she does not seem to show any signs of infection at this point she does have a little bit of weeping occurring in the right medial malleolus area. With that being said this does not appear to be too significant which is good news. 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 She has severe skin damage secondary to both of these features involving the dorsal left foot and medial left ankle and lower leg. Still has open areas in the left anterior foot. The area on the right closed over. She uses her own juxta light stockings. She does not have an arterial issue 09/16/17 on evaluation today patient actually appears to be doing excellent in regard to her bilateral lower extremity swelling. The Juxta-Lite compression wrap seem to be doing very well for her. She has not however been using the portion that goes over her foot. Her left foot still is draining a  little bit not nearly as significant as it has been in the past but still I do believe that she likely needs to utilize the full wrap including the foot portion of this will improve as well. She also has been apparently putting on a significant amount of Vaseline which also think is not helpful for her. I recommended that if she feels she needs something for moisturizer Eucerin will probably be better. 09/30/17 on evaluation today patient presents with several new open areas in regard to her left lower extremity although these appear to be minimal and mainly seem to be more moisture breakdown than anything. Fortunately she does not seem to have any evidence of infection which is great news. She has been tolerating the dressing changes without complication we are using silver alginate on the foot she has been using AB pads to have the legs and using her Juxta-Lite compression which seems to be controlling her swelling very well. Overall I'm pleased with the poor way she has progressed. 10/14/17 on evaluation today patient appears to be doing better in regard to her left lower extremity areas of weeping. She does still have some discomfort although in general this does not appear to be as macerated and I think it  is progressing nicely. I do think she still needs to wear the foot portion of her Juxta-Lite in order to get the most benefit from the wrap obviously. She states she understands. Fortunately there does not appear to be evidence of infection at this time which is great news. 10/28/17 on evaluation today patient appears to be doing excellent in regard to her left lower extremity. She has just a couple areas that are still open and seem to be causing any trouble whatsoever. For that reason I think that she is definitely headed in the right direction the spots are very tiny compared to what we have been dealing with in the past. 11/11/17 on evaluation today patient appears to have a right lateral lower extremity ulcer that has opened since I last saw her. She states this is where the home health nurse that was coming out remove the dressing without wetting the alginate first. Nonetheless I do not know if this is indeed the case or not but more importantly we have not ordered home help to be coming out for her wounds at all. I'm unsure as to why they are coming out and we're gonna have to check on this and get things situated in that regard. With that being said we currently really do not need them to be coming out as the patient has been taking care of her leg herself without complication and no issues. In fact she was doing much better prior to nursing coming out. 11/25/17 on evaluation today patient actually appears to be doing fairly well in regard to her left lower extremity swelling. In fact she has very little area of weeping at this point there's just a small spot on the lateral portion of her right leg that still has me just a little bit more concerned as far as wanting to see this clear up before I discharge her to caring for this at home. Nonetheless overall she has made excellent progress. 12/09/17 on evaluation today patient appears to be doing rather well in regard to her lower extremity  edema. She does have some weeping still in the left lower extremity although the big area we were taking care of two weeks ago actually has closed and she has another  area of weeping on the left lower extremity immediately as well is the top of her foot. She does not currently have lymphedema pumps she has been wearing her compression daily on a regular basis as directed. With that being said I think she may benefit from lymphedema pumps. 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 We had a very difficult time getting and keeping this under control. 12/23/17 on evaluation today patient actually appears to be doing a little bit more poorly in regard to her bilateral lower extremities. She has been tolerating the Juxta-Lite compression wraps. Unfortunately she has two new ulcers on the right lower extremity and left lower Trinity ulceration seems to be larger. Obviously this is not good news. She has been tolerating the dressings without complication. 12/30/17 on evaluation today patient actually appears to be doing much better in regard to her bilateral lower extremity edema. She continues to have some issues with ulcerations and in fact there appears to be one spot on each leg where the wrap may have caused a little bit of a blister which is subsequently opened up at this point is given her pain. Fortunately it does not appear to be any evidence of infection which is good news. No fevers chills noted. 01/13/18 on evaluation today patient appears to be doing rather well in regard to her bilateral lower extremities. The dressings did get kind of stuck as far as the wound beds are concerned but again I think this is mainly due to the fact that she actually seems to be showing signs of healing which is good news. She's not having as much drainage  therefore she was having more of the dressing sticking. Nonetheless overall I feel like her swelling is dramatically down compared to previous. 01/20/18 on evaluation today patient unfortunately though she's doing better in most regards has a large blister on the left anterior lower extremity where she is draining quite significantly. Subsequently this is going to need debridement today in order to see what's underneath and ensure she does not continue to trapping fluid at this location. Nonetheless No fevers, chills, nausea, or vomiting noted at this time. 01/27/18 on evaluation today patient appears to be doing rather well at this point in regard to her right lower extremity there's just a very small area that she still has open at this point. With that being said I do believe that she is tolerating the compression wraps very well in making good progress. Home health is coming out at this point to see her. Her left lower extremity on the lateral portion is actually what still mainly open and causing her some discomfort for the most part 02/10/18 on evaluation today patient actually appears to be doing very well in regard to her right lower extremity were all the ulcers appear to be completely close. In regard to the left lower extremity she does have two areas still open and some leaking from the dorsal surface of her foot but this still seems to be doing much better to me in general. 02/24/18 on evaluation today patient actually appears to be doing much better in regard to her right lower extremity this is still completely healed. Her left lower extremity is also doing much better fortunately she has no evidence of infection. The one area that is gonna require some debridement is still  on the left anterior shin. Fortunately this is not hurting her as badly today. 03/10/18 on evaluation today patient appears to be doing better in some regards although she has a little bit more open area on the dorsal  foot and she also has some issues on the medial portion of the left lower extremity which is actually new and somewhat deep. With that being said there fortunately does not appear to be any significant signs of infection which is good news. No fevers, chills, nausea, or vomiting noted at this time. In general her swelling seems to be doing fairly well which is good news. 03/31/18 on evaluation today patient presents for follow-up concerning her left lower extremity lymphedema. Unfortunately she has been doing a little bit more poorly since I last saw her in regard to the amount of weeping that she is experiencing. She's also having some increased pain in the anterior shin location. Unfortunately I do not feel like the patient is making such good progress at this point a few weeks back she was definitely doing much better. 04/07/18 on evaluation today patient actually appears to be showing some signs of improvement as far as the left lower extremity is concerned. She has been tolerating the dressing changes and it does appear that the Drawtex did better for her. With that being said unfortunately home health is stating that they cannot obtain the Drawtex going forward. Nonetheless we're gonna have to check and see what they may be able to get the alginate they were using was getting stuck in causing new areas of skin being pulled all that with and subsequently weep and calls her to worsen overall this is the first time we've seen improvement at this time. 04/14/18 on evaluation today patient actually appears to be doing rather well at this point there does not appear to be any evidence of infection at this time and she is actually doing excellent in regard to the weeping in fact she almost has no openings remaining even compared to just last week this is a dramatic improvement. No fevers chills noted 04/21/18 evaluation today patient actually appears to be doing very well. She in fact is has a small  area on the posterior lower extremity location and she has a small area on the dorsal surface of her foot that are still open both of which are very close to closing. We're hoping this will be close shortly. She brought her Juxta-Lite wrap with her today hoping that would be able to put her in it unfortunately I don't think were quite at that point yet but we're getting closer. 04/28/18 upon evaluation today patient actually appears to be doing excellent in regard to her left lower extremity ulcer. In fact the region on the posterior lower extremity actually is much smaller than previously noted. Overall I'm very happy with the progress she has made. She again did bring her Juxta-Lite although we're not quite ready for that yet. 05/11/18 upon evaluation today patient actually appears to be doing in general fairly well in regard to her left lower Trinity. The swelling is very well controlled. With that being said she has a new area on the left anterior lower extremity as well as between the first and second toes of her left foot that was not present during the last evaluation. The region of her posterior left lower extremity actually appears to be almost completely healed. To be honest I'm very pleased with the way that stands. Nonetheless I do believe  that the lotion may be keeping the area to moist as far as her legs are concerned subsequently I'm gonna consider discontinuing that today. 05/26/18 on evaluation today patient appears to be doing rather well in regard to her left lower should be ulcers. In fact everything appears to be close except for a very small area on the left posterior lower extremity. Fortunately there does not appear to be any evidence of infection at this time. Overall very pleased with her progress. 06/02/18 and evaluation today patient actually appears to be doing very well in regard to her lower extremity ulcers. She has one small area that still continues to weep that I  think may benefit her being able to justify lotion and user Juxta-Lite wraps versus continued to wrap her. Nonetheless I think this is something we can definitely look into at this point. 06/23/18 on evaluation today patient unfortunately has openings of her bilateral lower extremities. In general she seems to be doing much worse than when I last saw her just as far as her overall health standpoint is concerned. She states that her discomfort is mainly due to neuropathy she's not having any other issues otherwise. No fevers, chills, nausea, or vomiting noted at this time. 06/30/18 on evaluation today patient actually appears to be doing a little worse in regard to her right lower extremity her left lower extremity of doing fairly well. Fortunately there is no sign of infection at this time. She has been tolerating the dressing changes without complication. Home health did not come out like they were supposed to for the appropriate wrap changes. They stated that they never received the orders from [place] which were fax. Nonetheless we will send a copy of the orders with the patient today as well. 07/07/18 on evaluation today patient appears to be doing much better in regard to lower extremities. She still has several openings bilaterally although since I last saw her her legs did show obvious signs of infection when she later saw her nurse. Subsequently a culture was obtained and she is been placed on Bactrim and Keflex. Fortunately things seem to be looking much better it does appear she likely had an infection. Again last week we'd even discussed it but again there really was not any obvious sign that she had infection therefore we held off on the antibiotics. Nonetheless I'm glad she's doing better today. 07/14/18 on evaluation today patient appears to be doing much better regarding her bilateral lower Trinity's. In fact on the right lower for me there's nothing open at this point there are some dry skin  areas at the sites where she had infection. Fortunately there is no evidence of systemic infection which is excellent news. No fevers chills noted 07/21/18 on evaluation today patient actually appears to be doing much better in regard to her left lower extremity ulcers. She is making good progress and overall I feel like she's improving each time I see her. She's having no pain I do feel like the infection is completely resolved which is excellent news. No fevers, chills, nausea, or vomiting noted at this time. 07/28/18 on evaluation today patient appears to be doing very well in regard to her left lower [employer]. Everything seems to be showing signs of improvement which is excellent news. Overall very pleased with the progress that has been made. Fortunately there's no evidence of active infection at this time also excellent news. 08/04/18 on evaluation today patient appears to be doing more poorly in regard  to her bilateral lower extremities. She has two new areas open up on the right and these were completely closed as of last week. She still has the two spots on the left which in my pinion seem to be doing better. Fortunately there's no evidence of infection again at this point. 08/11/18 on evaluation today patient actually appears to be doing very well in regard to her bilateral lower Trinity wounds that all seem to be doing better and are measures smaller today. Fortunately there's no signs of infection. No fevers, chills, nausea, or vomiting noted at this time. 08/18/18 on evaluation today patient actually appears to be doing about the same inverter bilateral lower extremities. She continues to have areas that blistering open as was drain that fortunately nothing too significant. Overall I feel like Drawtex may have done better for her however compared to the collagen. 08/25/18 on evaluation today patient appears to be doing a little bit more poorly today even compared to last time I saw  her. Again I'm not exactly sure why she's making worse progress over the past several weeks. I'm beginning to wonder if there is some kind of underlying low level infection causing this issue. I did actually take a culture from the left anterior lower extremity but it was a new wound draining quite a bit at this point. Unfortunately she also seems to be having more pain which is what also makes me worried about the possibility of infection. This is despite never erythema noted at this point. 09/01/18 on evaluation today patient actually appears to be doing a little worse even compared to last week in regard to bilateral lower extremities. She did go to the hospital on the 16th was given a dose of IV Zosyn and then discharged with a recommendation to continue with the Bactrim that I previously prescribed for her. Nonetheless she is still having a lot of discomfort she tells me as well at this time. This is definitely unfortunate. No fevers, chills, nausea, or vomiting noted at this time. 09/08/18 on evaluation today patient's bilateral lower extremities actually appear to be shown signs of improvement which is good news. Fortunately there does not appear to be any signs of active infection I think the anabiotic is helping in this regard. Overall I'm very pleased with how she is progressing. 09/15/18 patient was actually seen in ER yesterday due to her legs as well unfortunately. She states she's been having a lot of pain and discomfort as well as a lot of drainage. Upon inspection today the patient does have a lot of swelling and drainage I feel like this is more related to lymphedema and poor fluid control than it is to infection based on what I'm seeing. The physician in the emergency department also doubted that the patient was having a significant infection nonetheless I see no evidence of infection obvious at this point although I do see evidence of poor fluid control. She still not using a  compression pumps, she is not elevating due to her lift chair as well as her hospital bed being broken, and she really is not keeping her legs up as much as they should be and also has been taking off her wraps. All this combined I think has led to poor fluid control and to be honest she may be somewhat volume overloaded in general as well. I recommend that she may need to contact your physician to see if a prescription for a diuretic would be beneficial in their opinion.  As long as this is safe I think it would likely help her. 09/29/18 on evaluation today patient's left lower extremity actually appears to be doing quite a bit better. At least compared to last time that I saw her. She still has a large area where she is draining from but there's a lot of new skin speckled trout and in fact there's more new skin that there are open areas of weeping and drainage at this point. This is good news. With regard to the right lower extremity this is doing much better with the only open area that I really see being a dry spot on the right lateral ankle currently. Fortunately there's no signs of active infection at this time which is good news. No fevers, chills, nausea, or vomiting noted at this time. The patient seems somewhat stressed and overwhelmed during the visit today she was very lethargic as such. She does and she is not taking any pain medications at this point. Apparently according to her husband are also in the process of moving which is probably taking its toll on her as well. 10/06/18 on evaluation today patient appears to be doing rather well in regard to her lower extremities compared to last evaluation. Fortunately there's no signs of active infection. She tells me she did have an appointment with her primary. Nonetheless he was concerned that the wounds were somewhat deep based on pictures but we never actually saw her legs. She states that he had her somewhat worried due to the fact that  she was fearing now that she was [place] have to have an amputation. With that being said based on what I'm seeing check she looks better this week that she has the last two times I've seen her with much less drainage I'm actually pleased in this regard. That doesn't mean that she's out of the water but again I do not think what the point of talking about education at all in regard to her leg. She is very happy to hear this. She is also not having as much pain as she was having last week. 10/13/18 unfortunately on evaluation today patient still continues to have a significant amount of drainage she's not letting home health actually apply the compression dressings at this point. She's trying to use of Juxta-Lite of the top of Kerlex and the second layer of the three layer compression wrap. With that being said she just does not seem to be making as good a progress as I would expect if she was having the compression applied and in place on a regular basis. No fevers, chills, nausea, or vomiting noted at this time. 10/20/18 on evaluation today patient appears to be doing a little better in regard to her bilateral lower extremity ulcers. In fact the right lower extremity seems to be healed she doesn't even have any openings at this point left lower extremity though still somewhat macerated seems to be showing signs of new skin growth at multiple locations throughout. Fortunately there's no evidence of active infection at this time. No fevers, chills, nausea, or vomiting noted at this time. 10/27/18 on evaluation today patient appears to be doing much better in regard to her left lower Trinity ulcer. She's been tolerating the laptop complication and has minimal drainage noted at this point. Fortunately there's no signs of active infection at this time. No fevers, chills, nausea, or vomiting noted at this time. 11/03/18 on evaluation today patient actually appears to be doing excellent in regard to  her left  lower extremity. She is having very little drainage at this point there does not appear to be any significant signs of infection overall very pleased with how things have gone. She is likewise extremely pleased still and seems to be making wonderful progress week to week. I do believe antibiotics were helpful for her. Her primary care provider did place on amateur clean since I last saw her. 11/17/18 on evaluation today patient appears to be doing worse in regard to her bilateral lower extremities at this point. She is been tolerating the dressing changes without complication. With that being said she typically takes the Coban off fairly quickly upon arriving home even after being seen here in the clinic and does not allow home health reapply command as part of the dressing at home. Therefore she said no compression essentially since I last saw her as best I can tell. With that being said I think it shows and how much swelling she has in the open wounds that are noted at this point. Fortunately there's no signs of infection but unfortunately if she doesn't get this under control I think she will end up with infection and more significant issues. 11/24/18 on evaluation today patient actually appears to be doing somewhat better in regard to her bilateral lower extremities. 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 Nonetheless that is doing better currently so she should be able to attempt reinitiating the lymphedema pumps at this time. No fevers, chills, nausea, or vomiting noted at this time. 12/01/18 upon evaluation today patient's left lower extremity appears to be doing quite well unfortunately her right lower extremity is not doing nearly as well. She has been tolerating the dressing changes without complication unfortunately she did not keep a wrap on the  right at this time. Nonetheless I believe this has led to increased swelling and weeping in the world is actually much larger than during the last evaluation with her. 12/08/18 on evaluation today patient appears to be doing about the same at this point in regard to her right lower extremity. There is some more palatable to touch I'm concerned about the possibility of there being some infection although I think the main issue is she's not keeping her compression wrap on which in turn is not allowing this area to heal appropriately. 12/22/18 on evaluation today patient appears to be doing better in regard to left lower extremity unfortunately significantly worse in regard to the right lower extremity. The areas of blistering and necrotic superficial tissue have spread and again this does not really appear to be signs of infection and all she just doesn't seem to be doing nearly as well is what she has been in the past. Overall I feel like the Augmentin did absolutely nothing for her she doesn't seem to have any infection again I really didn't think so last time either is more of a potential preventative measure and hoping that this would make some difference but I think the main issue is she's not wearing her compression. She tells me she cannot wear the [place] been we put on she takes it off pretty much upon getting home. Subsequently she worshiped Juxta-Lite when I questioned her about how often she wears it this is no more than three hours a day obviously that leaves 21  hours that she has no compression and this is obviously not doing well for her. Overall I'm concerned that if things continue to worsen she is at great risk of both infection as well as losing her leg. 01/05/19 on evaluation today patient appears to be doing well in regard to her left lower extremity which he is allowing [place] to wrap and not so well with regard to her right lower extremity which she is not allowing [place] to really wrap  and keep the wrap on. She states that it hurts too badly whenever it's wrapped and she ends up having to take it off. She's been using the Juxta-Lite she tells me up to six hours a day although I question whether or not that's really been the case to be honest. Previously she told me three hours today nonetheless obviously the legs as long as the wrap is doing great when she is not is doing much more poorly. 01/12/2019 on evaluation today patient actually appears to be doing a little better in my opinion with regard to her right lower extremity ulcer. She has a small open area on the left lower extremity unfortunately but again this I think is part of the normal fluctuation of what she is going to have to expect with regard to her legs especially when she is not using her lymphedema pumps on a regular basis. Subsequently based on what I am seeing today I think that she does seem to be doing slightly better with regard to her right lower extremity she did see her primary care provider on Monday they felt she had an infection and placed her on 2 antibiotics. Both Cipro and clindamycin. Subsequently again she seems possibly to be doing a little bit better in regards to the right lower extremity she also tells me however she has been wearing the compression wrap over the past week since I spoke with her as well that is a Kerlix and Coban wrap on the right. No fevers, chills, nausea, vomiting, or diarrhea. 01/19/2019 on evaluation today patient appears to be doing better with regard to her bilateral lower extremities especially the right. I feel like the compression has been beneficial for her which is great news. She did get a call from her primary care provider on her way here today telling her that she did have methicillin-resistant Staphylococcus aureus and he was calling in a couple new antibiotics for her including a ointment to be applied she tells me 3 times a day. With that being said this sounds  like likely to be Bactroban which I think could be applied with each dressing/wrap change but I would not be able to accommodate her applying this 3 times a day. She is in agreement with the least doing this we will add that to her orders today. 01/26/2019 on evaluation today patient actually appears to be doing much better with regard to her right lower extremity. Her left lower extremity is also doing quite well all things considering. Fortunately there is no evidence of active infection at this time. No fevers, chills, nausea, vomiting, or diarrhea. 02/02/2019 on evaluation today patient appears to be doing much better compared to her last evaluation. Little by little off like her right leg is returning more towards normal. There does not appear to be any signs of active infection and overall she seems to be doing quite well which is great news. I am very pleased in this regard. No fevers, chills, nausea, vomiting, or diarrhea. 02/09/2019  upon evaluation today patient appears to be doing better with regard to her bilateral lower extremities. She has been tolerating the dressing changes without complication. Fortunately there is no signs of active infection at this time. No fevers, chills, nausea, vomiting, or diarrhea. 02/23/2019 on evaluation today patient actually appears to be doing quite well with regard to her bilateral lower extremities. She has been tolerating the dressing changes without complication. She is even used her pumps one time and states that she really felt like it felt good. With that being said she seems to be in good spirits and her legs appear to be doing excellent. 03/09/2019 on evaluation today patient appears to be doing well with regard to her right lower extremity there are no open wounds at this time she is having some discomfort but I feel like this is more neuropathy than anything. With regard to her left lower extremity she had several areas scattered around that she  does have some weeping and drainage from but again overall she does not appear to be having any significant issues and no evidence of infection at this time which is good news. 03/23/2019 on evaluation today patient appears to be doing well with regard to her right lower extremity which she tells me is still close she is using her juxta light here. Her left lower extremity she mainly just has an area on the foot which is still slightly draining although this also is doing great. Overall very pleased at this time. 04/06/2019 patient appears to be doing a little bit worse in regard to her left lower extremity upon evaluation today. She feels like this could be becoming infected again which she had issues with previous. Fortunately there is no signs of systemic infection but again this is always a struggle with her with her legs she will go from doing well to not so well in a very short amount of time. 04/20/2019 on evaluation today patient actually appears to be doing quite well with regard to her right lower extremity I do not see any signs of active infection at this time. Fortunately there is no fever chills noted. She is still taking the antibiotics which I prescribed for her at this point. In regard to the left lower extremity I do feel like some of these areas are better although again she still is having weeping from several locations at this time. 04/27/2019 on evaluation today patient appears to be doing about the same if not slightly worse in regard to her left lower extremity ulcers. She tells me when questioned that she has been sleeping in her Hoveround chair in fact she tells me she falls asleep without even knowing it. I think she is spending a whole lot of time in the chair and less time walking and moving around which is not good for her legs either. On top of that she is in a seated position which is also the worst position she is not really elevating her legs and she is also not  using her lymphedema pumps. All this is good to contribute to worsening of her condition in general. 05/18/2019 on evaluation today patient appears to be doing well with regard to her lower extremity on the right in fact this is showing no signs of any open wounds at this time. On the left she is continuing to have issues with areas that do drain. Some of the regions have healed and there are couple areas that have reopened. She did go to  the ER per the patient according to recommendations from the home health nurse due to what she was seen when she came out on 05/13/2019. Subsequently she felt like the patient needed to go to the hospital due to the fact that again she was having "milky white discharge" from her leg. Nonetheless she had and then was placed on doxycycline and subsequently seems to be doing better. 06/01/2019 upon evaluation today patient appears to be doing really in my opinion about the same. I do not see any signs of active infection which is good news. Overall she still has wounds over the bilateral lower extremities she has reopened on the right but this appears to be more of a crack where there is weeping/edema coming from the region. I do not see any evidence of infection at either site based on what I visualized today. 07/13/2019 upon evaluation today patient appears to be doing a little worse compared to last time I saw her. She since has been in the hospital from 06/21/2019 through 06/29/2019. This was secondary to having Covid. During that time they did apply lotion to her legs which unfortunately has caused her to develop a myriad of open wounds on her lower extremities. Her legs do appear to be doing better as far as the overall appearance is concerned but nonetheless she does have more open and weeping areas. 07/27/2019 upon evaluation today patient appears to be doing more poorly to be honest in regard to her left lower extremity in particular. There is no signs of  systemic infection although I do believe she may have local infection. She notes she has been having a lot of blue/green drainage which is consistent potentially with Pseudomonas. That may be something that we need to consider here as well. The doxycycline does not seem to have been helping. 08/03/2019 upon evaluation today patient appears to be doing a little better in my opinion compared to last week's evaluation. Her culture I did review today and she is on appropriate medications to help treat the Enterobacter that was noted. Overall I feel like that is good news. With that being said she is unfortunately continuing to have a lot of drainage and though it is doing better I still think she has a long ways to go to get things dried up in general. Fortunately there is no signs of systemic infection. 08/10/2019 upon evaluation today patient appears to be doing may be slightly better in regard to her left lower extremity the right lower extremity is doing much better. Fortunately there is no signs of infection right now which is good news. No fevers, chills, nausea, vomiting, or diarrhea. Electronic Signature(s) Signed: 08/10/2019 11:53:28 AM By: [name] PA-C Entered By: [name] on 08/10/2019 11:53:27 -------------------------------------------------------------------------------- Physical Exam Details Patient Name: Date of Service: MANDI, MATTIOLI 08/10/2019 10:00 AM Medical Record [id number] Patient Account Number: [id number] Date of Birth/Sex: Treating RN: [birth year] (69 y.o. [name] Primary Care Provider: [name] Other Clinician: Referring Provider: Treating Provider/Extender:Stone III, [name], FRED Weeks in Treatment: 23 Constitutional Well-nourished and well-hydrated in no acute distress. Respiratory normal breathing without difficulty. Psychiatric this patient is able to make decisions and demonstrates good insight into disease process.  Alert and Oriented x 3. pleasant and cooperative. Notes Patient's wound bed currently showed signs of good healing on the right lower extremity this in fact does not appear to be draining at this point as best I can tell there may be a small area  still open but I really think it may have sealed over. On the left lower extremity she still has a large area of opening draining ulceration although there is a lot of skin scattered throughout there is also a lot of ulcers throughout this region. Electronic Signature(s) Signed: 08/10/2019 11:53:53 AM By: [name] PA-C Entered By: [name] on 08/10/2019 11:53:52 -------------------------------------------------------------------------------- Physician Orders Details Patient Name: Date of Service: [name]. 08/10/2019 10:00 AM Medical Record [id number] Patient Account Number: [id number] Date of Birth/Sex: Treating RN: [birth year] (68 y.o. [name] Primary Care Provider: [name] Other Clinician: Referring Provider: Treating Provider/Extender:Stone III, [name], FRED Weeks in Treatment: [phone] Verbal / Phone Orders: No Diagnosis Coding ICD-10 Coding Code Description E11.622 Type 2 diabetes mellitus with other skin ulcer I89.0 Lymphedema, not elsewhere classified I87.331 Chronic venous hypertension (idiopathic) with ulcer and inflammation of right lower extremity I87.332 Chronic venous hypertension (idiopathic) with ulcer and inflammation of left lower extremity L97.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 leg with fat layer exposed L97.822 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 I10 Essential (primary) hypertension E66.01 Morbid (severe) obesity due to excess calories F41.8 Other specified anxiety disorders R53.1 Weakness Follow-up Appointments Return  Appointment in 1 week. Dressing Change Frequency Change dressing three times week. - both legs Skin Barriers/Peri-Wound Care Barrier cream - zinc oxide cream to any macerated areas Wound Cleansing Clean wound with Wound Cleanser - all wounds May shower with protection. Primary Wound Dressing Wound #61 Left,Circumferential Lower Leg Calcium Alginate with Silver Wound #63 Right,Medial Malleolus Calcium Alginate with Silver Wound #64 Left,Dorsal Foot Calcium Alginate with Silver Secondary Dressing Wound #61 Left,Circumferential Lower Leg Dry Gauze - all wounds ABD pad - as needed Zetuvit or Kerramax - as needed Wound #63 Right,Medial Malleolus Dry Gauze - all wounds ABD pad - as needed Wound #64 Left,Dorsal Foot Dry Gauze - all wounds ABD pad - as needed Zetuvit or Kerramax - as needed Edema Control 3 Layer Compression System - Bilateral Avoid standing for long periods of time - walking is encouraged Elevate legs to the level of the heart or above for 30 minutes daily and/or when sitting, a frequency of: - do not sleep in chair with feet dangling, MUST elevate legs while sitting Exercise regularly Support Garment 20-30 mm/Hg pressure to: - Juxtalite to right lower leg if unable to tolerate wrap Segmental Compressive Device. - lymphedema pumps 60 minutes 1- 2 times per day Off-Loading Turn and reposition every 2 hours Additional Orders / Instructions Follow Nutritious Diet - To include vitamin A, vitamin C, and Zinc along with increased protein intake. [place] skilled [facility] for wound care. - [facility] Electronic Signature(s) Signed: 08/10/2019 5:55:18 PM By: [name] PA-C Signed: 08/10/2019 6:08:21 PM By: [name] RN, BSN Entered By: [name] on 08/10/2019 11:50:06 -------------------------------------------------------------------------------- Problem List Details Patient Name: Date of Service: [name]. 08/10/2019  10:00 AM Medical Record [id number] Patient Account Number: [id number] Date of Birth/Sex: Treating RN: 1950-07-17 (69 y.o. [name] Primary Care Provider: [name] Other Clinician: Referring Provider: Treating Provider/Extender:Stone III, [name], FRED Weeks in Treatment: [phone] Active Problems ICD-10 Evaluated Encounter Code Description Active Date Today Diagnosis E11.622 Type 2 diabetes mellitus with other skin ulcer 08/05/2017 No Yes I89.0 Lymphedema, not elsewhere classified 08/05/2017 No Yes I87.331 Chronic venous  hypertension (idiopathic) with ulcer 08/05/2017 No Yes and inflammation of right lower extremity I87.332 Chronic venous hypertension (idiopathic) with ulcer 08/05/2017 No Yes and inflammation of left lower extremity L97.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 08/05/2017 No Yes leg with fat layer exposed L97.822 Non-pressure chronic ulcer of other part of left lower 08/05/2017 No Yes leg with fat layer exposed L97.522 Non-pressure chronic ulcer of other part of left foot 06/01/2019 No Yes with fat layer exposed I10 Essential (primary) hypertension 08/05/2017 No Yes E66.01 Morbid (severe) obesity due to excess calories 08/05/2017 No Yes F41.8 Other specified anxiety disorders 08/05/2017 No Yes R53.1 Weakness 08/05/2017 No Yes Inactive Problems Resolved Problems Electronic Signature(s) Signed: 08/10/2019 10:52:05 AM By: [name] PA-C Entered By: [name] on 08/10/2019 10:52:04 -------------------------------------------------------------------------------- Progress Note Details Patient Name: Date of Service: [name]. 08/10/2019 10:00 AM Medical Record [id number] Patient Account Number: [id number] Date of Birth/Sex: Treating RN: 1950/11/10 (68 y.o. [name] Primary Care Provider: Other Clinician: [name] Referring Provider: Treating Provider/Extender:Stone III, [name], FRED Weeks in  Treatment: 105 Subjective Chief Complaint Information obtained from Patient Bilateral reoccurring LE ulcers History of Present Illness (HPI) this patient has been seen a couple of times before and returns with recurrent problems to her right and left lower extremity with swelling and weeping ulcerations due to not wearing her compression stockings which she had been advised to do during her last discharge, at the end of June 2018. During her last visit the patient had had normal arterial blood flow and her venous reflux study did not necessitate any surgical intervention. She was recommended compression and elevation and wound care. After prolonged treatment the patient was completely healed but she has been noncompliant with wearing or compressions.. She was here last week with an outpatient return visit planned but the patient came in a very poor general condition with altered mental status and was rushed to the ER on my request. With a history of hypertension, diabetes, TIA and right-sided weakness she was set up for an MRI on her brain and cervical spine and was sent to [facility]. Getting an MRI done was very difficult but once the workup was done she was found not to have any spinal stenosis, epidural abscess or hematoma or discitis. This was radiculopathy to be treated as an outpatient and she was given a follow-up appointment. Today she is feeling much better alert and oriented and has come to reevaluate her bilateral lower extremity lymphedema and ulceration 03/25/2017 -- she was admitted to the hospital on 03/16/2017 and discharged on 03/18/2017 with left leg cellulitis and ulceration. She was started on vancomycin and Zosyn and x-ray showed no bony involvement. She was treated for a cellulitis with IV antibiotics changed to Rocephin and Flagyl and was discharged on oral Keflex and doxycycline to complete a 7 day course. Last hemoglobin A1c was 7.1 and her other ailments  including hypertension got asthma were appropriately treated. 05/06/2017 -- she is awaiting the right size of compression stockings from [place] but other than that has been doing well. ====== Old notes 69 year old patient was seen one time last October and was lost to follow-up. She has recurrent problems with weeping and ulceration of her left lower extremity and has swelling of this for several years. It has been worse for the last 2 months. Past medical history is significant for diabetes mellitus type 2, hypertension, gout, morbid obesity, depressive disorders, hiatal hernia, migraines, status post knee  surgery, risk of a cholecystectomy, vaginal hysterectomy and breast biopsy. She is not a smoker. As noted before she has never had a venous duplex study and an arterial ABI study was attempted but the left lower extremity was noncompressible 10/01/2016 -- had a lower extremity venous duplex reflux evaluation which showed no evidence of deep vein reflux in the right or left lower extremity, and no evidence of great saphenous vein reflux more than 500 ms in the right or left lower extremity, and the left small saphenous vein is incompetent but no vascular consult was recommended. review of her electronic medical records noted that the ABI was checked in July 2017 where the right ABI was normal limits and the left ABI could not be ascertained due to pain with cuff pressure but the waveforms are within normal limits. her arterial duplex study scheduled for April 27. 10/08/2016 -- the patient has various reasons for not having a compression on and for the last 3 days she has had no compression on her left lower extremity either due to pain or the lack of nursing help. She does not use her juxta lites either. 10/15/2016 -- the patient did not keep her appointment for arterial duplex study on April 27 and I have asked her to reschedule this. Her pain is out of proportion with the physical  findings and she continuously fails to wear a compression wraps and cuts them off because she says she cannot tolerate the pain. She does not use her juxta lites either. 10/22/2016 -- he has rescheduled her arterial duplex study to May 21 and her pain today is a bit better. She has not been wearing her juxta lites on her right lower extremity but now understands that she needs to do this. She did tolerate the to press compression wrap on her left lower extremity 10/29/2016 --arterial duplex study is scheduled for next week and overall she has been tolerating her compression wraps and also using her juxta lites on her right lower extremity 11/05/2016 -- the right ABI was 0.95 the left was 1.03. The digit TBI is on the right was 0.83 on the left was 0.92 and she had biphasic flow through these vessels. The impression was that of normal lower extremity arterial study. 11/12/2016 -- her pain is minimal and she is doing very well overall. 11/26/2016 -- she has got juxta lites and her insurance will not pay for additional dual layer compression stockings. She is going to order some from [place]. 05/12/2017 -- her juxta lites are very old and too big for her and these have not been helping with compression. She did get 20-30 mm compression stockings from [place] but she and her husband are unable to put these on. I believe she will benefit from bilateral Extremit-ease, compression stockings and we will measure her for these today. 05/20/2017 -- lymphedema on the left lower extremity has increased a lot and she has a open ulceration as a result of this. The right lower extremity is looking pretty good. She has decided to by the compression stockings herself and will get reimbursed by the home health, at a later date. 05/27/2017 -- her sciatica is bothering her a lot and she thought her left leg pain was caused due to the compression wrap and hence removed it and has significant lymphedema. There is no  inflammation on this left lower extremity. 06/17/17 on evaluation today patient appears to be doing very well and in fact is completely healed in regard to her  ulcerations. Unfortunately however she does have continued issues with lymphedema nonetheless. We did order compression garments for her unfortunately she states that the size that she received were large although we ordered medium. Obviously this means she is not getting the optimal compression. She does not have those with her today and therefore we could not confirm and contact the company on her behalf. Nonetheless she does state that she is going to have her husband bring them by tomorrow so that we can verify and then get in touch with the company. No fevers, chills, nausea, or vomiting noted at this time. Overall patient is doing better otherwise and I'm pleased with the progress she has made. 07/01/17 on evaluation today patient appears to be doing very well in regard to her bilateral lower extremity she does not have any openings at this point which is excellent news. Overall I'm pleased with how things have progressed up to this time. Since she is doing so well we did order her compression which we are seeing her today to ensure that it fits her properly and everything is doing well in that regard and then subsequently she will be discharged. ============ Old Notes: 03/31/16 patient presents today for evaluation concerning open wounds that she has over the left medial ankle region as well as the left dorsal foot. She has previously had this occur although it has been healed for a number of months after having this for about a year prior until her hospitalization on 01/05/16. At that point in time it appears that she was admitted to the hospital for left lower extremity cellulitis and was placed on vancomycin and Zosyn at that point. Eventually upon discharge on January 15, 2016 she was placed on doxycycline at that point in time.  Later on 03/27/16 positive wound culture growing Escherichia coli this was switched to amoxicillin. Currently she tells me that she is having pain radiated to be a 7 out of 10 which can be as high as 10 out of 10 with palpation and manipulation of the wound. This wound appears to be mainly venous in nature due to the bilateral lower extremity venous stasis/lymphedema. This is definitely much worse on her left than the right side. She does have type 1 diabetes mellitus, hypertension, morbid obesity, and is wheelchair dependent.during the course of the hospital stay a blood culture was also obtained and fortunately appeared negative. She also had an x-ray of the tibia/fibula on the left which showed no acute bone abnormality. Her white blood cell count which was performed last on 03/25/16 was 7.3, hemoglobin 12.8, protein 7.1, albumin 3.0. Her urine culture appeared to be negative for any specific organisms. Patient did have a left lower extremity venous duplex evaluation for DVT. This did not include venous reflux studies but fortunately was negative for DVT. Patient also had arterial studies performed which revealed that she had a normal ABI on the right though this was unable to be performed on the left secondary to pain that she was having around the ankle region due to the wound. However it was stated on report that she had biphasic pulses and apparently good blood flow. ========== 06/03/17 she is here in follow-up evaluation for right lower extremity ulcer. The right lower sure he has healed but she has reopened to the left medial malleolus and dorsal foot with weeping. She is waiting for new compression garments to arrive from home health, the previous compression garments were ill fitting. We will continue with compression bilaterally  and follow-up in 2 weeks Readmission: 08/05/17 on evaluation today patient appears to be doing somewhat poorly in regard to her left lower  extremity especially although the right lower extremity has a small area which may no longer be open. She has been having a lot of drainage from the left lower extremity however he tells me that she has not been able to use the EXTREMIT- EASE Compression at this point. She states that she did better and was able to actually apply the Juxta-Lite compression although the wound that she has is too large and therefore really does not compress which is why she cannot wear it at this point. She has no one who can help her put it on regular basis her son can sometimes but he's not able to do it most of the time. I do believe that's why she has begun to weave and have issues as she is currently yet again. No fevers, chills, nausea, or vomiting noted at this time. Patient is no evidence of dementia. 08/12/17 on evaluation today patient appears to still be doing fairly well in regard to the draining areas/weeping areas at this point. With that being said she unfortunately did go to the ER yesterday due to what was felt to be possibly a cellulitis. They place her on doxycycline by mouth and discharge her home. She definitely was not admitted. With that being said she states she has had more discomfort which has been unusual for her even compared to prior times and she's had infections.08/12/17 on evaluation today patient appears to still be doing fairly well in regard to the draining areas/weeping areas at this point. With that being said she unfortunately did go to the ER yesterday due to what was felt to be possibly a cellulitis. They place her on doxycycline by mouth and discharge her home. She definitely was not admitted. With that being said she states she has had more discomfort which has been unusual for her even compared to prior times and she's had infections. 08/19/17 put evaluation today patient tells me that she's been having a lot of what sounds to be neuropathic type pain in regard to her left lower  extremity. She has been using over-the-counter topical bins again which some believe. That in order to apply the she actually remove the wrap we put on her last Wednesday on Thursday. Subsequently she has not had anything on compression wise since that time. The good news is a lot of the weeping areas appear to have closed at this point again I believe she would do better with compression but we are struggling to get her to actually use what she needs to at this point. No fevers, chills, nausea, or vomiting noted at this time. 09/03/17 on evaluation today patient appears to be doing okay in regard to her lower extremities in regard to the lymphedema and weeping. Fortunately she does not seem to show any signs of infection at this point she does have a little bit of weeping occurring in the right medial malleolus area. With that being said this does not appear to be too significant which is good news. 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 She has severe skin damage secondary to both of these features involving the dorsal left foot and medial left ankle and lower leg. Still has open areas in the left anterior foot. The area on the right closed over. She uses her own juxta light stockings.  She does not have an arterial issue 09/16/17 on evaluation today patient actually appears to be doing excellent in regard to her bilateral lower extremity swelling. The Juxta-Lite compression wrap seem to be doing very well for her. She has not however been using the portion that goes over her foot. Her left foot still is draining a little bit not nearly as significant as it has been in the past but still I do believe that she likely needs to utilize the full wrap including the foot portion of this will improve as well. She also has been apparently putting on a significant amount of Vaseline which also think is not helpful for her. I recommended  that if she feels she needs something for moisturizer Eucerin will probably be better. 09/30/17 on evaluation today patient presents with several new open areas in regard to her left lower extremity although these appear to be minimal and mainly seem to be more moisture breakdown than anything. Fortunately she does not seem to have any evidence of infection which is great news. She has been tolerating the dressing changes without complication we are using silver alginate on the foot she has been using AB pads to have the legs and using her Juxta-Lite compression which seems to be controlling her swelling very well. Overall I'm pleased with the poor way she has progressed. 10/14/17 on evaluation today patient appears to be doing better in regard to her left lower extremity areas of weeping. She does still have some discomfort although in general this does not appear to be as macerated and I think it is progressing nicely. I do think she still needs to wear the foot portion of her Juxta-Lite in order to get the most benefit from the wrap obviously. She states she understands. Fortunately there does not appear to be evidence of infection at this time which is great news. 10/28/17 on evaluation today patient appears to be doing excellent in regard to her left lower extremity. She has just a couple areas that are still open and seem to be causing any trouble whatsoever. For that reason I think that she is definitely headed in the right direction the spots are very tiny compared to what we have been dealing with in the past. 11/11/17 on evaluation today patient appears to have a right lateral lower extremity ulcer that has opened since I last saw her. She states this is where the home health nurse that was coming out remove the dressing without wetting the alginate first. Nonetheless I do not know if this is indeed the case or not but more importantly we have not ordered home help to be coming out for her  wounds at all. I'm unsure as to why they are coming out and we're gonna have to check on this and get things situated in that regard. With that being said we currently really do not need them to be coming out as the patient has been taking care of her leg herself without complication and no issues. In fact she was doing much better prior to nursing coming out. 11/25/17 on evaluation today patient actually appears to be doing fairly well in regard to her left lower extremity swelling. In fact she has very little area of weeping at this point there's just a small spot on the lateral portion of her right leg that still has me just a little bit more concerned as far as wanting to see this clear up before I discharge her to caring for  this at home. Nonetheless overall she has made excellent progress. 12/09/17 on evaluation today patient appears to be doing rather well in regard to her lower extremity edema. She does have some weeping still in the left lower extremity although the big area we were taking care of two weeks ago actually has closed and she has another area of weeping on the left lower extremity immediately as well is the top of her foot. She does not currently have lymphedema pumps she has been wearing her compression daily on a regular basis as directed. With that being said I think she may benefit from lymphedema pumps. 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 We had a very difficult time getting and keeping this under control. 12/23/17 on evaluation today patient actually appears to be doing a little bit more poorly in regard to her bilateral lower extremities. She has been tolerating the Juxta-Lite compression wraps. Unfortunately she has two new ulcers on the right lower extremity and left lower Trinity ulceration seems to be larger.  Obviously this is not good news. She has been tolerating the dressings without complication. 12/30/17 on evaluation today patient actually appears to be doing much better in regard to her bilateral lower extremity edema. She continues to have some issues with ulcerations and in fact there appears to be one spot on each leg where the wrap may have caused a little bit of a blister which is subsequently opened up at this point is given her pain. Fortunately it does not appear to be any evidence of infection which is good news. No fevers chills noted. 01/13/18 on evaluation today patient appears to be doing rather well in regard to her bilateral lower extremities. The dressings did get kind of stuck as far as the wound beds are concerned but again I think this is mainly due to the fact that she actually seems to be showing signs of healing which is good news. She's not having as much drainage therefore she was having more of the dressing sticking. Nonetheless overall I feel like her swelling is dramatically down compared to previous. 01/20/18 on evaluation today patient unfortunately though she's doing better in most regards has a large blister on the left anterior lower extremity where she is draining quite significantly. Subsequently this is going to need debridement today in order to see what's underneath and ensure she does not continue to trapping fluid at this location. Nonetheless No fevers, chills, nausea, or vomiting noted at this time. 01/27/18 on evaluation today patient appears to be doing rather well at this point in regard to her right lower extremity there's just a very small area that she still has open at this point. With that being said I do believe that she is tolerating the compression wraps very well in making good progress. Home health is coming out at this point to see her. Her left lower extremity on the lateral portion is actually what still mainly open and causing her some  discomfort for the most part 02/10/18 on evaluation today patient actually appears to be doing very well in regard to her right lower extremity were all the ulcers appear to be completely close. In regard to the left lower extremity she does have two areas still open and some leaking from the dorsal surface of her foot but this still seems to  be doing much better to me in general. 02/24/18 on evaluation today patient actually appears to be doing much better in regard to her right lower extremity this is still completely healed. Her left lower extremity is also doing much better fortunately she has no evidence of infection. The one area that is gonna require some debridement is still on the left anterior shin. Fortunately this is not hurting her as badly today. 03/10/18 on evaluation today patient appears to be doing better in some regards although she has a little bit more open area on the dorsal foot and she also has some issues on the medial portion of the left lower extremity which is actually new and somewhat deep. With that being said there fortunately does not appear to be any significant signs of infection which is good news. No fevers, chills, nausea, or vomiting noted at this time. In general her swelling seems to be doing fairly well which is good news. 03/31/18 on evaluation today patient presents for follow-up concerning her left lower extremity lymphedema. Unfortunately she has been doing a little bit more poorly since I last saw her in regard to the amount of weeping that she is experiencing. She's also having some increased pain in the anterior shin location. Unfortunately I do not feel like the patient is making such good progress at this point a few weeks back she was definitely doing much better. 04/07/18 on evaluation today patient actually appears to be showing some signs of improvement as far as the left lower extremity is concerned. She has been tolerating the dressing changes  and it does appear that the Drawtex did better for her. With that being said unfortunately home health is stating that they cannot obtain the Drawtex going forward. Nonetheless we're gonna have to check and see what they may be able to get the alginate they were using was getting stuck in causing new areas of skin being pulled all that with and subsequently weep and calls her to worsen overall this is the first time we've seen improvement at this time. 04/14/18 on evaluation today patient actually appears to be doing rather well at this point there does not appear to be any evidence of infection at this time and she is actually doing excellent in regard to the weeping in fact she almost has no openings remaining even compared to just last week this is a dramatic improvement. No fevers chills noted 04/21/18 evaluation today patient actually appears to be doing very well. She in fact is has a small area on the posterior lower extremity location and she has a small area on the dorsal surface of her foot that are still open both of which are very close to closing. We're hoping this will be close shortly. She brought her Juxta-Lite wrap with her today hoping that would be able to put her in it unfortunately I don't think were quite at that point yet but we're getting closer. 04/28/18 upon evaluation today patient actually appears to be doing excellent in regard to her left lower extremity ulcer. In fact the region on the posterior lower extremity actually is much smaller than previously noted. Overall I'm very happy with the progress she has made. She again did bring her Juxta-Lite although we're not quite ready for that yet. 05/11/18 upon evaluation today patient actually appears to be doing in general fairly well in regard to her left lower Trinity. The swelling is very well controlled. With that being said she has a new  area on the left anterior lower extremity as well as between the first and  second toes of her left foot that was not present during the last evaluation. The region of her posterior left lower extremity actually appears to be almost completely healed. To be honest I'm very pleased with the way that stands. Nonetheless I do believe that the lotion may be keeping the area to moist as far as her legs are concerned subsequently I'm gonna consider discontinuing that today. 05/26/18 on evaluation today patient appears to be doing rather well in regard to her left lower should be ulcers. In fact everything appears to be close except for a very small area on the left posterior lower extremity. Fortunately there does not appear to be any evidence of infection at this time. Overall very pleased with her progress. 06/02/18 and evaluation today patient actually appears to be doing very well in regard to her lower extremity ulcers. She has one small area that still continues to weep that I think may benefit her being able to justify lotion and user Juxta-Lite wraps versus continued to wrap her. Nonetheless I think this is something we can definitely look into at this point. 06/23/18 on evaluation today patient unfortunately has openings of her bilateral lower extremities. In general she seems to be doing much worse than when I last saw her just as far as her overall health standpoint is concerned. She states that her discomfort is mainly due to neuropathy she's not having any other issues otherwise. No fevers, chills, nausea, or vomiting noted at this time. 06/30/18 on evaluation today patient actually appears to be doing a little worse in regard to her right lower extremity her left lower extremity of doing fairly well. Fortunately there is no sign of infection at this time. She has been tolerating the dressing changes without complication. Home health did not come out like they were supposed to for the appropriate wrap changes. They stated that they never received the orders from [place]  which were fax. Nonetheless we will send a copy of the orders with the patient today as well. 07/07/18 on evaluation today patient appears to be doing much better in regard to lower extremities. She still has several openings bilaterally although since I last saw her her legs did show obvious signs of infection when she later saw her nurse. Subsequently a culture was obtained and she is been placed on Bactrim and Keflex. Fortunately things seem to be looking much better it does appear she likely had an infection. Again last week we'd even discussed it but again there really was not any obvious sign that she had infection therefore we held off on the antibiotics. Nonetheless I'm glad she's doing better today. 07/14/18 on evaluation today patient appears to be doing much better regarding her bilateral lower Trinity's. In fact on the right lower for me there's nothing open at this point there are some dry skin areas at the sites where she had infection. Fortunately there is no evidence of systemic infection which is excellent news. No fevers chills noted 07/21/18 on evaluation today patient actually appears to be doing much better in regard to her left lower extremity ulcers. She is making good progress and overall I feel like she's improving each time I see her. She's having no pain I do feel like the infection is completely resolved which is excellent news. No fevers, chills, nausea, or vomiting noted at this time. 07/28/18 on evaluation today patient appears to be doing  very well in regard to her left lower Trinity ulcer's. Everything seems to be showing signs of improvement which is excellent news. Overall very pleased with the progress that has been made. Fortunately there's no evidence of active infection at this time also excellent news. 08/04/18 on evaluation today patient appears to be doing more poorly in regard to her bilateral lower extremities. She has two new areas open up on the right and  these were completely closed as of last week. She still has the two spots on the left which in my pinion seem to be doing better. Fortunately there's no evidence of infection again at this point. 08/11/18 on evaluation today patient actually appears to be doing very well in regard to her bilateral lower Trinity wounds that all seem to be doing better and are measures smaller today. Fortunately there's no signs of infection. No fevers, chills, nausea, or vomiting noted at this time. 08/18/18 on evaluation today patient actually appears to be doing about the same inverter bilateral lower extremities. She continues to have areas that blistering open as was drain that fortunately nothing too significant. Overall I feel like Drawtex may have done better for her however compared to the collagen. 08/25/18 on evaluation today patient appears to be doing a little bit more poorly today even compared to last time I saw her. Again I'm not exactly sure why she's making worse progress over the past several weeks. I'm beginning to wonder if there is some kind of underlying low level infection causing this issue. I did actually take a culture from the left anterior lower extremity but it was a new wound draining quite a bit at this point. Unfortunately she also seems to be having more pain which is what also makes me worried about the possibility of infection. This is despite never erythema noted at this point. 09/01/18 on evaluation today patient actually appears to be doing a little worse even compared to last week in regard to bilateral lower extremities. She did go to the hospital on the 16th was given a dose of IV Zosyn and then discharged with a recommendation to continue with the Bactrim that I previously prescribed for her. Nonetheless she is still having a lot of discomfort she tells me as well at this time. This is definitely unfortunate. No fevers, chills, nausea, or vomiting noted at this time. 09/08/18 on  evaluation today patient's bilateral lower extremities actually appear to be shown signs of improvement which is good news. Fortunately there does not appear to be any signs of active infection I think the anabiotic is helping in this regard. Overall I'm very pleased with how she is progressing. 09/15/18 patient was actually seen in ER yesterday due to her legs as well unfortunately. She states she's been having a lot of pain and discomfort as well as a lot of drainage. Upon inspection today the patient does have a lot of swelling and drainage I feel like this is more related to lymphedema and poor fluid control than it is to infection based on what I'm seeing. The physician in the emergency department also doubted that the patient was having a significant infection nonetheless I see no evidence of infection obvious at this point although I do see evidence of poor fluid control. She still not using a compression pumps, she is not elevating due to her lift chair as well as her hospital bed being broken, and she really is not keeping her legs up as much as they should  be and also has been taking off her wraps. All this combined I think has led to poor fluid control and to be honest she may be somewhat volume overloaded in general as well. I recommend that she may need to contact your physician to see if a prescription for a diuretic would be beneficial in their opinion. As long as this is safe I think it would likely help her. 09/29/18 on evaluation today patient's left lower extremity actually appears to be doing quite a bit better. At least compared to last time that I saw her. She still has a large area where she is draining from but there's a lot of new skin speckled trout and in fact there's more new skin that there are open areas of weeping and drainage at this point. This is good news. With regard to the right lower extremity this is doing much better with the only open area that I really see  being a dry spot on the right lateral ankle currently. Fortunately there's no signs of active infection at this time which is good news. No fevers, chills, nausea, or vomiting noted at this time. The patient seems somewhat stressed and overwhelmed during the visit today she was very lethargic as such. She does and she is not taking any pain medications at this point. Apparently according to her husband are also in the process of moving which is probably taking its toll on her as well. 10/06/18 on evaluation today patient appears to be doing rather well in regard to her lower extremities compared to last evaluation. Fortunately there's no signs of active infection. She tells me she did have an appointment with her primary. Nonetheless he was concerned that the wounds were somewhat deep based on pictures but we never actually saw her legs. She states that he had her somewhat worried due to the fact that she was fearing now that she was [place] have to have an amputation. With that being said based on what I'm seeing check she looks better this week that she has the last two times I've seen her with much less drainage I'm actually pleased in this regard. That doesn't mean that she's out of the water but again I do not think what the point of talking about education at all in regard to her leg. She is very happy to hear this. She is also not having as much pain as she was having last week. 10/13/18 unfortunately on evaluation today patient still continues to have a significant amount of drainage she's not letting home health actually apply the compression dressings at this point. She's trying to use of Juxta-Lite of the top of Kerlex and the second layer of the three layer compression wrap. With that being said she just does not seem to be making as good a progress as I would expect if she was having the compression applied and in place on a regular basis. No fevers, chills, nausea, or vomiting noted at  this time. 10/20/18 on evaluation today patient appears to be doing a little better in regard to her bilateral lower extremity ulcers. In fact the right lower extremity seems to be healed she doesn't even have any openings at this point left lower extremity though still somewhat macerated seems to be showing signs of new skin growth at multiple locations throughout. Fortunately there's no evidence of active infection at this time. No fevers, chills, nausea, or vomiting noted at this time. 10/27/18 on evaluation today patient appears to be  doing much better in regard to her left lower Trinity ulcer. She's been tolerating the laptop complication and has minimal drainage noted at this point. Fortunately there's no signs of active infection at this time. No fevers, chills, nausea, or vomiting noted at this time. 11/03/18 on evaluation today patient actually appears to be doing excellent in regard to her left lower extremity. She is having very little drainage at this point there does not appear to be any significant signs of infection overall very pleased with how things have gone. She is likewise extremely pleased still and seems to be making wonderful progress week to week. I do believe antibiotics were helpful for her. Her primary care provider did place on amateur clean since I last saw her. 11/17/18 on evaluation today patient appears to be doing worse in regard to her bilateral lower extremities at this point. She is been tolerating the dressing changes without complication. With that being said she typically takes the Coban off fairly quickly upon arriving home even after being seen here in the clinic and does not allow home health reapply command as part of the dressing at home. Therefore she said no compression essentially since I last saw her as best I can tell. With that being said I think it shows and how much swelling she has in the open wounds that are noted at this point. Fortunately there's  no signs of infection but unfortunately if she doesn't get this under control I think she will end up with infection and more significant issues. 11/24/18 on evaluation today patient actually appears to be doing somewhat better in regard to her bilateral lower extremities. 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 Nonetheless that is doing better currently so she should be able to attempt reinitiating the lymphedema pumps at this time. No fevers, chills, nausea, or vomiting noted at this time. 12/01/18 upon evaluation today patient's left lower extremity appears to be doing quite well unfortunately her right lower extremity is not doing nearly as well. She has been tolerating the dressing changes without complication unfortunately she did not keep a wrap on the right at this time. Nonetheless I believe this has led to increased swelling and weeping in the world is actually much larger than during the last evaluation with her. 12/08/18 on evaluation today patient appears to be doing about the same at this point in regard to her right lower extremity. There is some more palatable to touch I'm concerned about the possibility of there being some infection although I think the main issue is she's not keeping her compression wrap on which in turn is not allowing this area to heal appropriately. 12/22/18 on evaluation today patient appears to be doing better in regard to left lower extremity unfortunately significantly worse in regard to the right lower extremity. The areas of blistering and necrotic superficial tissue have spread and again this does not really appear to be signs of infection and all she just doesn't seem to be doing nearly as well is what she has been in the past. Overall I feel like the Augmentin did absolutely nothing for her she doesn't seem to  have any infection again I really didn't think so last time either is more of a potential preventative measure and hoping that this would make some difference but  I think the main issue is she's not wearing her compression. She tells me she cannot wear the [place] been we put on she takes it off pretty much upon getting home. Subsequently she worshiped Juxta-Lite when I questioned her about how often she wears it this is no more than three hours a day obviously that leaves 21 hours that she has no compression and this is obviously not doing well for her. Overall I'm concerned that if things continue to worsen she is at great risk of both infection as well as losing her leg. 01/05/19 on evaluation today patient appears to be doing well in regard to her left lower extremity which he is allowing [place] to wrap and not so well with regard to her right lower extremity which she is not allowing [place] to really wrap and keep the wrap on. She states that it hurts too badly whenever it's wrapped and she ends up having to take it off. She's been using the Juxta-Lite she tells me up to six hours a day although I question whether or not that's really been the case to be honest. Previously she told me three hours today nonetheless obviously the legs as long as the wrap is doing great when she is not is doing much more poorly. 01/12/2019 on evaluation today patient actually appears to be doing a little better in my opinion with regard to her right lower extremity ulcer. She has a small open area on the left lower extremity unfortunately but again this I think is part of the normal fluctuation of what she is going to have to expect with regard to her legs especially when she is not using her lymphedema pumps on a regular basis. Subsequently based on what I am seeing today I think that she does seem to be doing slightly better with regard to her right lower extremity she did see her primary care provider on Monday they  felt she had an infection and placed her on 2 antibiotics. Both Cipro and clindamycin. Subsequently again she seems possibly to be doing a little bit better in regards to the right lower extremity she also tells me however she has been wearing the compression wrap over the past week since I spoke with her as well that is a Kerlix and Coban wrap on the right. No fevers, chills, nausea, vomiting, or diarrhea. 01/19/2019 on evaluation today patient appears to be doing better with regard to her bilateral lower extremities especially the right. I feel like the compression has been beneficial for her which is great news. She did get a call from her primary care provider on her way here today telling her that she did have methicillin-resistant Staphylococcus aureus and he was calling in a couple new antibiotics for her including a ointment to be applied she tells me 3 times a day. With that being said this sounds like likely to be Bactroban which I think could be applied with each dressing/wrap change but I would not be able to accommodate her applying this 3 times a day. She is in agreement with the least doing this we will add that to her orders today. 01/26/2019 on evaluation today patient actually appears to be doing much better with regard to her right lower extremity. Her left lower extremity is also doing quite well all things considering. Fortunately there is no evidence of active infection at this time. No fevers, chills, nausea, vomiting, or diarrhea. 02/02/2019 on evaluation today patient appears to be doing much  better compared to her last evaluation. Little by little off like her right leg is returning more towards normal. There does not appear to be any signs of active infection and overall she seems to be doing quite well which is great news. I am very pleased in this regard. No fevers, chills, nausea, vomiting, or diarrhea. 02/09/2019 upon evaluation today patient appears to be doing better  with regard to her bilateral lower extremities. She has been tolerating the dressing changes without complication. Fortunately there is no signs of active infection at this time. No fevers, chills, nausea, vomiting, or diarrhea. 02/23/2019 on evaluation today patient actually appears to be doing quite well with regard to her bilateral lower extremities. She has been tolerating the dressing changes without complication. She is even used her pumps one time and states that she really felt like it felt good. With that being said she seems to be in good spirits and her legs appear to be doing excellent. 03/09/2019 on evaluation today patient appears to be doing well with regard to her right lower extremity there are no open wounds at this time she is having some discomfort but I feel like this is more neuropathy than anything. With regard to her left lower extremity she had several areas scattered around that she does have some weeping and drainage from but again overall she does not appear to be having any significant issues and no evidence of infection at this time which is good news. 03/23/2019 on evaluation today patient appears to be doing well with regard to her right lower extremity which she tells me is still close she is using her juxta light here. Her left lower extremity she mainly just has an area on the foot which is still slightly draining although this also is doing great. Overall very pleased at this time. 04/06/2019 patient appears to be doing a little bit worse in regard to her left lower extremity upon evaluation today. She feels like this could be becoming infected again which she had issues with previous. Fortunately there is no signs of systemic infection but again this is always a struggle with her with her legs she will go from doing well to not so well in a very short amount of time. 04/20/2019 on evaluation today patient actually appears to be doing quite well with regard to her  right lower extremity I do not see any signs of active infection at this time. Fortunately there is no fever chills noted. She is still taking the antibiotics which I prescribed for her at this point. In regard to the left lower extremity I do feel like some of these areas are better although again she still is having weeping from several locations at this time. 04/27/2019 on evaluation today patient appears to be doing about the same if not slightly worse in regard to her left lower extremity ulcers. She tells me when questioned that she has been sleeping in her Hoveround chair in fact she tells me she falls asleep without even knowing it. I think she is spending a whole lot of time in the chair and less time walking and moving around which is not good for her legs either. On top of that she is in a seated position which is also the worst position she is not really elevating her legs and she is also not using her lymphedema pumps. All this is good to contribute to worsening of her condition in general. 05/18/2019 on evaluation today patient appears to  be doing well with regard to her lower extremity on the right in fact this is showing no signs of any open wounds at this time. On the left she is continuing to have issues with areas that do drain. Some of the regions have healed and there are couple areas that have reopened. She did go to the ER per the patient according to recommendations from the home health nurse due to what she was seen when she came out on 05/13/2019. Subsequently she felt like the patient needed to go to the hospital due to the fact that again she was having "milky white discharge" from her leg. Nonetheless she had and then was placed on doxycycline and subsequently seems to be doing better. 06/01/2019 upon evaluation today patient appears to be doing really in my opinion about the same. I do not see any signs of active infection which is good news. Overall she still has  wounds over the bilateral lower extremities she has reopened on the right but this appears to be more of a crack where there is weeping/edema coming from the region. I do not see any evidence of infection at either site based on what I visualized today. 07/13/2019 upon evaluation today patient appears to be doing a little worse compared to last time I saw her. She since has been in the hospital from 06/21/2019 through 06/29/2019. This was secondary to having Covid. During that time they did apply lotion to her legs which unfortunately has caused her to develop a myriad of open wounds on her lower extremities. Her legs do appear to be doing better as far as the overall appearance is concerned but nonetheless she does have more open and weeping areas. 07/27/2019 upon evaluation today patient appears to be doing more poorly to be honest in regard to her left lower extremity in particular. There is no signs of systemic infection although I do believe she may have local infection. She notes she has been having a lot of blue/green drainage which is consistent potentially with Pseudomonas. That may be something that we need to consider here as well. The doxycycline does not seem to have been helping. 08/03/2019 upon evaluation today patient appears to be doing a little better in my opinion compared to last week's evaluation. Her culture I did review today and she is on appropriate medications to help treat the Enterobacter that was noted. Overall I feel like that is good news. With that being said she is unfortunately continuing to have a lot of drainage and though it is doing better I still think she has a long ways to go to get things dried up in general. Fortunately there is no signs of systemic infection. 08/10/2019 upon evaluation today patient appears to be doing may be slightly better in regard to her left lower extremity the right lower extremity is doing much better. Fortunately there is no signs of  infection right now which is good news. No fevers, chills, nausea, vomiting, or diarrhea. Objective Constitutional Well-nourished and well-hydrated in no acute distress. Vitals Time Taken: 10:52 AM, Height: 62 in, Weight: 335 lbs, BMI: 61.3, Temperature: 98.9 F, Pulse: 77 bpm, Respiratory Rate: 18 breaths/min, Blood Pressure: 106/39 mmHg, Capillary Blood Glucose: 129 mg/dl. General Notes: glucose per pt report Respiratory normal breathing without difficulty. Psychiatric this patient is able to make decisions and demonstrates good insight into disease process. Alert and Oriented x 3. pleasant and cooperative. General Notes: Patient's wound bed currently showed signs of good healing on  the right lower extremity this in fact does not appear to be draining at this point as best I can tell there may be a small area still open but I really think it may have sealed over. On the left lower extremity she still has a large area of opening draining ulceration although there is a lot of skin scattered throughout there is also a lot of ulcers throughout this region. Integumentary (Hair, Skin) Wound #61 status is Open. Original cause of wound was Gradually Appeared. The wound is located on the Left,Circumferential Lower Leg. The wound measures 18cm length x 30cm width x 0.1cm depth; 424.115cm^2 area and 42.412cm^3 volume. There is Fat Layer (Subcutaneous Tissue) Exposed exposed. There is no tunneling or undermining noted. There is a large amount of serous drainage noted. The wound margin is flat and intact. There is small (1-33%) pink granulation within the wound bed. There is a large (67-100%) amount of necrotic tissue within the wound bed including Adherent Slough. Wound #63 status is Open. Original cause of wound was Gradually Appeared. The wound is located on the Right,Medial Malleolus. The wound measures 0.3cm length x 0.5cm width x 0.1cm depth; 0.118cm^2 area and 0.012cm^3 volume. There is Fat  Layer (Subcutaneous Tissue) Exposed exposed. There is no tunneling or undermining noted. There is a small amount of serosanguineous drainage noted. The wound margin is well defined and not attached to the wound base. There is large (67-100%) pale granulation within the wound bed. There is no necrotic tissue within the wound bed. Wound #64 status is Open. Original cause of wound was Gradually Appeared. The wound is located on the Left,Dorsal Foot. The wound measures 10.5cm length x 10.5cm width x 0.1cm depth; 86.59cm^2 area and 8.659cm^3 volume. There is Fat Layer (Subcutaneous Tissue) Exposed exposed. There is no tunneling or undermining noted. There is a small amount of serous drainage noted. The wound margin is flat and intact. There is no granulation within the wound bed. There is a large (67-100%) amount of necrotic tissue within the wound bed including Adherent Slough. 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 There was a Three Layer Compression Therapy Procedure by Carlene Coria, RN. Post procedure Diagnosis Wound #61: Same as Pre-Procedure Wound #63 Pre-procedure diagnosis of Wound #63 is a Venous Leg Ulcer located on the Right,Medial  Malleolus . There was a Three Layer Compression Therapy Procedure by Carlene Coria, RN. Post procedure Diagnosis Wound #63: Same as Pre-Procedure Plan Follow-up Appointments: Return Appointment in 1 week. Dressing Change Frequency: Change dressing three times week. - both legs Skin Barriers/Peri-Wound Care: Barrier cream - zinc oxide cream to any macerated areas Wound Cleansing: Clean wound with Wound Cleanser - all wounds May shower with protection. Primary Wound Dressing: Wound #61 Left,Circumferential Lower Leg: Calcium Alginate with Silver Wound #63 Right,Medial Malleolus: Calcium Alginate with Silver Wound #64 Left,Dorsal Foot: Calcium Alginate  with Silver Secondary Dressing: Wound #61 Left,Circumferential Lower Leg: Dry Gauze - all wounds ABD pad - as needed Zetuvit or Kerramax - as needed Wound #63 Right,Medial Malleolus: Dry Gauze - all wounds ABD pad - as needed Wound #64 Left,Dorsal Foot: Dry Gauze - all wounds ABD pad - as needed Zetuvit or Kerramax - as needed Edema Control: 3 Layer Compression System - Bilateral Avoid standing for long periods of time - walking is encouraged Elevate legs to the level of the heart or above for 30 minutes daily and/or when sitting, a frequency of: - do not sleep in chair with feet dangling, MUST elevate legs while sitting Exercise regularly Support Garment 20-30 mm/Hg pressure to: - Juxtalite to right lower leg if unable to tolerate wrap Segmental Compressive Device. - lymphedema pumps 60 minutes 1- 2 times per day Off-Loading: Turn and reposition every 2 hours Additional Orders / Instructions: Follow Nutritious Diet - To include vitamin A, vitamin C, and Zinc along with increased protein intake. Home Health: [facility] for wound care. - [facility]. My suggestion at this time is good to be that we wrap the leg on the right 1 more week although it is probably healed we will confirm it still doing okay  come next week. 2. I am also going to suggest that we go ahead continue with the wrap on the left lower extremity we will use zinc oxide cream to the macerated area to protect any of the skin that not open and that way hopefully we can slowly but surely get this area to close and discontinue draining. 3. We will continue with 3 layer compression wrap the Unna boot wrap was potentially recommended but to be honest I do not think that skin to provide enough compression she really is not walking most of the time her legs are down as well I discussed this with her as well she needs to get her legs up. We will see patient back for reevaluation in 1 week here in the clinic. If anything worsens or changes patient will contact our office for additional recommendations. Electronic Signature(s) Signed: 08/10/2019 11:54:43 AM By: [name] PA-C Entered By: [name] on 08/10/2019 11:54:43 -------------------------------------------------------------------------------- SuperBill Details Patient Name: Date of Service: [name] 08/10/2019 Medical Record [id number] Patient Account Number: [id number] Date of Birth/Sex: Treating RN: May 08, 1951 (69 y.o. [name] Primary Care Provider: [name] Other Clinician: Referring Provider: Treating Provider/Extender:Stone III, [name], FRED Weeks in Treatment: 105 Diagnosis Coding ICD-10 Codes Code Description E11.622 Type 2 diabetes mellitus with other skin ulcer I89.0 Lymphedema, not elsewhere classified I87.331 Chronic venous hypertension (idiopathic) with ulcer and inflammation of right lower extremity I87.332 Chronic venous hypertension (idiopathic) with ulcer and inflammation of left lower extremity L97.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 leg with fat layer exposed L97.822 Non-pressure chronic ulcer of other part of left lower leg with fat layer exposed L97.522 Non-pressure chronic ulcer of  other part of left foot with fat layer exposed I10 Essential (primary) hypertension E66.01 Morbid (severe) obesity due to excess calories F41.8 Other specified anxiety disorders R53.1 Weakness Facility Procedures Physician Procedures CPT4 Code Description: [id number] - WC PHYS LEVEL 3 - EST PT ICD-10 Diagnosis Description E11.622 Type 2 diabetes mellitus with other skin ulcer I89.0 Lymphedema, not elsewhere classified I87.331 Chronic venous hypertension (idiopathic) with ulcer and  lower extremity I87.332 Chronic venous hypertension (idiopathic) with ulcer and extremity Modifier: inflammation of inflammation of  Quantity: 1 right left lower Electronic Signature(s) Signed: 08/10/2019 11:55:07 AM By: [name] PA-C Entered By: [name] on 08/10/2019 11:55:06

## 2019-08-16 ENCOUNTER — Ambulatory Visit: Payer: Medicare PPO | Admitting: Internal Medicine

## 2019-08-16 ENCOUNTER — Encounter: Payer: Self-pay | Admitting: Internal Medicine

## 2019-08-16 ENCOUNTER — Other Ambulatory Visit: Payer: Self-pay

## 2019-08-16 VITALS — BP 103/69 | HR 92 | Temp 97.7°F

## 2019-08-16 DIAGNOSIS — M7989 Other specified soft tissue disorders: Secondary | ICD-10-CM

## 2019-08-16 DIAGNOSIS — L97909 Non-pressure chronic ulcer of unspecified part of unspecified lower leg with unspecified severity: Secondary | ICD-10-CM

## 2019-08-16 NOTE — Patient Instructions (Signed)
[name],  It was very nice to meet you today.  Someone will call you to schedule the left leg debridement.  In the meantime please continue your care as usual.  You may use vaseline on your right leg as needed for dry and cracked skin.  Please call with any questions or concerns.

## 2019-08-16 NOTE — Progress Notes (Signed)
IJANAE, MACAPAGAL ([id number]) Visit Report for 08/10/2019 Arrival Information Details Patient Name: Date of Service: [name], [name] 08/10/2019 10:00 AM Medical Record [id number] Patient Account Number: [id number] Date of Birth/Sex: Treating RN: 1951-04-30 (69 y.o. [name] Primary Care Colt Martelle: [name] Other Clinician: Referring Jameriah Trotti: Treating Hunt Zajicek/Extender:Stone III, [name], FRED Weeks in Treatment: 105 Visit Information History Since Last Visit Added or deleted any medications: No Patient Arrived: Wheel Chair Any new allergies or adverse reactions: No Arrival Time: 10:50 Had a fall or experienced change in No activities of daily living that may affect Accompanied By: alone risk of falls: Transfer Assistance: None Signs or symptoms of abuse/neglect since last No Patient Identification Verified: Yes visito Secondary Verification Process Yes Hospitalized since last visit: No Completed: Implantable device outside of the clinic excluding No Patient Requires Transmission-Based No cellular tissue based products placed in the center Precautions: since last visit: Patient Has Alerts: Yes Has Dressing in Place as Prescribed: Yes Patient Alerts: R ABI= Has Compression in Place as Prescribed: Yes 1.01 Pain Present Now: No L ABI = .99 Electronic Signature(s) Signed: 08/11/2019 8:56:01 AM By: Levan Hurst RN, BSN Entered By: Levan Hurst on 08/10/2019 10:50:58 -------------------------------------------------------------------------------- Compression Therapy Details Patient Name: Date of Service: [name]. 08/10/2019 10:00 AM Medical Record [id number] Patient Account Number: [id number] Date of Birth/Sex: Treating RN: [birth year] (69 y.o. [name] Primary Care Rashmi Tallent: [name] Other Clinician: Referring Shuntell Foody: Treating Jozee Hammer/Extender:Stone III, [name], FRED Weeks in Treatment: 105 Compression  Therapy Performed for Wound Wound #61 Left,Circumferential Lower Leg Assessment: Performed By: Clinician Carlene Coria, RN Compression Type: Three Layer Post Procedure Diagnosis Same as Pre-procedure Electronic Signature(s) Signed: 08/10/2019 6:08:21 PM By: [name] RN, BSN Entered By: [name] on 08/10/2019 11:48:28 -------------------------------------------------------------------------------- Compression Therapy Details Patient Name: Date of Service: [name]. 08/10/2019 10:00 AM Medical Record [id number] Patient Account Number: [id number] Date of Birth/Sex: Treating RN: 15-Mar-1951 (69 y.o. [name] Primary Care Marcelina Mclaurin: [name] Other Clinician: Referring Meili Kleckley: Treating Kealan Buchan/Extender:Stone III, [name], FRED Weeks in Treatment: 105 Compression Therapy Performed for Wound Wound #63 Right,Medial Malleolus Assessment: Performed By: Jake Church, RN Compression Type: Three Layer Post Procedure Diagnosis Same as Pre-procedure Electronic Signature(s) Signed: 08/10/2019 6:08:21 PM By: [name] RN, BSN Entered By: [name] on 08/10/2019 11:48:29 -------------------------------------------------------------------------------- Encounter Discharge Information Details Patient Name: Date of Service: [name]. 08/10/2019 10:00 AM Medical Record [id number] Patient Account Number: [id number] Date of Birth/Sex: Treating RN: 11/19/1950 (69 y.o. [name] Primary Care Arianni Gallego: [name] Other Clinician: Referring Nichalos Brenton: Treating Dekendrick Uzelac/Extender:Stone III, [name], FRED Weeks in Treatment: [phone] Encounter Discharge Information Items Discharge Condition: Stable Ambulatory Status: Wheelchair Discharge Destination: Home Transportation: Private Auto Accompanied By: self Schedule Follow-up Appointment: Yes Clinical Summary of Care: Patient Declined Electronic Signature(s) Signed:  08/16/2019 9:14:02 AM By: Carlene Coria RN Entered By: Carlene Coria on 08/10/2019 12:12:09 -------------------------------------------------------------------------------- Lower Extremity Assessment Details Patient Name: Date of Service: [name] 08/10/2019 10:00 AM Medical Record [id number] Patient Account Number: [id number] Date of Birth/Sex: Treating RN: 1950/11/16 (69 y.o. [name] Primary Care Rhian Funari: [name] Other Clinician: Referring Kennadee Walthour: Treating Kamilla Hands/Extender:Stone III, [name], FRED Weeks in Treatment: 105 Edema Assessment Assessed: [Left: No] [Right: No] Edema: [Left: Yes] [Right: Yes] Calf Left: Right: Point of Measurement: 37 cm From Medial Instep 38 cm 34 cm Ankle Left: Right: Point of Measurement: 10 cm From Medial Instep 23 cm 23 cm Vascular Assessment Pulses: Dorsalis Pedis Palpable: [  Left:Yes] [Right:Yes] Electronic Signature(s) Signed: 08/11/2019 8:56:01 AM By: Levan Hurst RN, BSN Entered By: Levan Hurst on 08/10/2019 10:58:36 -------------------------------------------------------------------------------- Multi-Disciplinary Care Plan Details Patient Name: Date of Service: [name]. 08/10/2019 10:00 AM Medical Record [id number] Patient Account Number: [id number] Date of Birth/Sex: Treating RN: 1950-06-22 (69 y.o. [name] Primary Care Ritta Hammes: [name] Other Clinician: Referring Chelsea Pedretti: Treating Oralee Rapaport/Extender:Stone III, [name], FRED Weeks in Treatment: [id number] Active Inactive Venous Leg Ulcer Nursing Diagnoses: Actual venous Insuffiency (use after diagnosis is confirmed) Knowledge deficit related to disease process and management Goals: Patient will maintain optimal edema control Date Initiated: 08/12/2017 Target Resolution Date: 08/24/2019 Goal Status: Active Patient/caregiver will verbalize understanding of disease process and disease management Date Initiated:  08/12/2017 Date Inactivated: 04/21/2018 Target Resolution Date: 04/24/2018 Goal Status: Met Interventions: Assess peripheral edema status every visit. Compression as ordered Treatment Activities: Therapeutic compression applied : 08/12/2017 Notes: Wound/Skin Impairment Nursing Diagnoses: Impaired tissue integrity Knowledge deficit related to ulceration/compromised skin integrity Goals: Patient/caregiver will verbalize understanding of skin care regimen Date Initiated: 08/12/2017 Target Resolution Date: 08/24/2019 Goal Status: Active Ulcer/skin breakdown will have a volume reduction of 30% by week 4 Date Initiated: 08/05/2017 Date Inactivated: 09/30/2017 Target Resolution Date: 10/03/2017 Goal Status: Met Ulcer/skin breakdown will have a volume reduction of 50% by week 8 Date Initiated: 09/30/2017 Date Inactivated: 10/28/2017 Target Resolution Date: 10/28/2017 Goal Status: Met Interventions: Assess patient/caregiver ability to perform ulcer/skin care regimen upon admission and as needed Assess ulceration(s) every visit Provide education on ulcer and skin care Screen for HBO Treatment Activities: Patient referred to home care : 08/05/2017 Skin care regimen initiated : 08/05/2017 Topical wound management initiated : 08/05/2017 Notes: Electronic Signature(s) Signed: 08/10/2019 6:08:21 PM By: [name] RN, BSN Entered By: [name] on 08/10/2019 11:46:39 -------------------------------------------------------------------------------- Pain Assessment Details Patient Name: Date of Service: [name]. 08/10/2019 10:00 AM Medical Record [id number] Patient Account Number: [id number] Date of Birth/Sex: Treating RN: May 17, 1951 (69 y.o. [name] Primary Care Maor Meckel: [name] Other Clinician: Referring Ryaan Vanwagoner: Treating Pharrah Rottman/Extender:Stone III, [name], FRED Weeks in Treatment: 105 Active Problems Location of Pain Severity and Description of  Pain Patient Has Paino No Site Locations Pain Management and Medication Current Pain Management: Electronic Signature(s) Signed: 08/11/2019 8:56:01 AM By: Levan Hurst RN, BSN Entered By: Levan Hurst on 08/10/2019 10:51:05 -------------------------------------------------------------------------------- Patient/Caregiver Education Details Patient Name: [name] 2/24/2021andnbsp10:00 Date of Service: AM Medical Record [id number] Number: Patient Account Number: [id number] Treating RN: Date of Birth/Gender: Mar 14, 1951 (68 y.o. [name]) Other Clinician: Primary Care Physician:SMITH, FRED Primary Care Physician:SMITH, FRED Treating [name] Referring Physician: Physician/Extender: Wilhelmenia Blase in Treatment: 105 Education Assessment Education Provided To: Patient Education Topics Provided Venous: Methods: Explain/Verbal Responses: Reinforcements needed, State content correctly Wound/Skin Impairment: Methods: Explain/Verbal Responses: Reinforcements needed, State content correctly Electronic Signature(s) Signed: 08/10/2019 6:08:21 PM By: [name] RN, BSN Entered By: [name] on 08/10/2019 11:47:49 -------------------------------------------------------------------------------- Wound Assessment Details Patient Name: Date of Service: [name]. 08/10/2019 10:00 AM Medical Record [id number] Patient Account Number: [id number] Date of Birth/Sex: Treating RN: [birth year] (69 y.o. [name] Primary Care Brandis Matsuura: [name] Other Clinician: Referring Jaykob Minichiello: Treating Leontyne Manville/Extender:Stone III, [name], FRED Weeks in Treatment: 105 Wound Status Wound Number: 61 Primary Venous Leg Ulcer Etiology: Wound Location: Left Lower Leg - Circumferential Wound Open Wounding Event: Gradually Appeared Status: Date Acquired: 04/20/2019 Comorbid Asthma, Hypertension, Peripheral Arterial Weeks Of Treatment:  16 History: Disease, Peripheral Venous Disease, Type II Clustered Wound:  No Diabetes, Gout, Osteoarthritis Photos Wound Measurements Length: (cm) 18 % Reduct Width: (cm) 30 % Reduct Depth: (cm) 0.1 Epitheli Area: (cm) 424.115 Tunneli Volume: (cm) 42.412 Undermi Wound Description Classification: Full Thickness Without Exposed Support Foul Odo Structures Slough/F Wound Flat and Intact Margin: Exudate Large Amount: Exudate Serous Type: Exudate amber Color: Wound Bed Granulation Amount: Small (1-33%) Granulation Quality: Pink Fascia E Necrotic Amount: Large (67-100%) Fat Laye Necrotic Quality: Adherent Slough Tendon E Muscle E Joint Ex Bone Exp r After Cleansing: No ibrino Yes Exposed Structure xposed: No r (Subcutaneous Tissue) Exposed: Yes xposed: No xposed: No posed: No osed: No ion in Area: -5524.9% ion in Volume: -5524.9% alization: None ng: No ning: No Treatment Notes Wound #61 (Left, Circumferential Lower Leg) 1. Cleanse With Wound Cleanser Soap and water 2. Periwound Care Barrier cream 3. Primary Dressing Applied Calcium Alginate Ag 4. Secondary Dressing ABD Pad 6. Support Layer Applied 3 layer compression wrap Electronic Signature(s) Signed: 08/10/2019 5:09:01 PM By: Mikeal Hawthorne EMT/HBOT Signed: 08/10/2019 6:08:21 PM By: [name] RN, BSN Entered By: Mikeal Hawthorne on 08/10/2019 14:48:55 -------------------------------------------------------------------------------- Wound Assessment Details Patient Name: Date of Service: [name]. 08/10/2019 10:00 AM Medical Record [id number] Patient Account Number: [id number] Date of Birth/Sex: Treating RN: 03-28-1951 (69 y.o. [name] Primary Care Vishwa Dais: [name] Other Clinician: Referring Makaylia Hewett: Treating Shemica Meath/Extender:Stone III, [name], FRED Weeks in Treatment: 105 Wound Status Wound Number: 63 Primary Venous Leg Ulcer Etiology: Wound Location:  Right Malleolus - Medial Wound Open Wounding Event: Gradually Appeared Status: Date Acquired: 05/17/2019 Comorbid Asthma, Hypertension, Peripheral Arterial Weeks Of Treatment: 10 History: Disease, Peripheral Venous Disease, Type II Clustered Wound: No Diabetes, Gout, Osteoarthritis Photos Wound Measurements Length: (cm) 0.3 % Reduct Width: (cm) 0.5 % Reduct Depth: (cm) 0.1 Epitheli Area: (cm) 0.118 Tunneli Volume: (cm) 0.012 Undermi Wound Description Classification: Full Thickness Without Exposed Support Foul Od Structures Slough/ Wound Well defined, not attached Margin: Exudate Small Amount: Exudate Serosanguineous Type: Exudate red, brown Color: Wound Bed Granulation Amount: Large (67-100%) Granulation Quality: Pale Fascia E Necrotic Amount: None Present (0%) Fat Laye Tendon E Muscle E Joint Ex Bone Exp or After Cleansing: No Fibrino Yes Exposed Structure xposed: No r (Subcutaneous Tissue) Exposed: Yes xposed: No xposed: No posed: No osed: No ion in Area: 92.8% ion in Volume: 92.6% alization: Large (67-100%) ng: No ning: No Treatment Notes Wound #63 (Right, Medial Malleolus) 1. Cleanse With Wound Cleanser Soap and water 2. Periwound Care Barrier cream 3. Primary Dressing Applied Calcium Alginate Ag 4. Secondary Dressing ABD Pad 6. Support Layer Applied 3 layer compression wrap Electronic Signature(s) Signed: 08/10/2019 5:09:01 PM By: Mikeal Hawthorne EMT/HBOT Signed: 08/10/2019 6:08:21 PM By: [name] RN, BSN Entered By: Mikeal Hawthorne on 08/10/2019 14:48:29 -------------------------------------------------------------------------------- Wound Assessment Details Patient Name: Date of Service: [name]. 08/10/2019 10:00 AM Medical Record [id number] Patient Account Number: [id number] Date of Birth/Sex: Treating RN: [birth year] (69 y.o. [name] Primary Care Sayana Salley: [name] Other Clinician: Referring  Zaylon Bossier: Treating Niels Cranshaw/Extender:Stone III, [name], FRED Weeks in Treatment: 105 Wound Status Wound Number: 64 Primary Diabetic Wound/Ulcer of the Lower Extremity Etiology: Wound Location: Left Foot - Dorsal Wound Open Wounding Event: Gradually Appeared Status: Date Acquired: 06/01/2019 Comorbid Asthma, Hypertension, Peripheral Arterial Weeks Of Treatment: 10 History: Disease, Peripheral Venous Disease, Type II Clustered Wound: No Diabetes, Gout, Osteoarthritis Photos Wound Measurements Length: (cm) 10.5 Width: (cm) 10.5 Depth: (cm) 0.1 Area: (cm) 86.59 Volume: (cm) 8.659 Wound Description Classification: Grade 1 Wound  Margin: Flat and Intact Exudate Amount: Small Exudate Type: Serous Exudate Color: amber Wound Bed Granulation Amount: None Present (0%) Necrotic Amount: Large (67-100%) Necrotic Quality: Adherent Slough er Cleansing: No o Yes Exposed Structure d: No bcutaneous Tissue) Exposed: Yes d: No d: No : No No % Reduction in Area: -52378.8% % Reduction in Volume: -17571.4% Epithelialization: None Tunneling: No Undermining: No Foul Odor Aft Slough/Fibrin Fascia Expose Fat Layer (Su Tendon Expose Muscle Expose Joint Exposed Bone Exposed: Treatment Notes Wound #64 (Left, Dorsal Foot) 1. Cleanse With Wound Cleanser Soap and water 2. Periwound Care Barrier cream 3. Primary Dressing Applied Calcium Alginate Ag 4. Secondary Dressing ABD Pad 6. Support Layer Applied 3 layer compression wrap Electronic Signature(s) Signed: 08/10/2019 5:09:01 PM By: Mikeal Hawthorne EMT/HBOT Signed: 08/10/2019 6:08:21 PM By: [name] RN, BSN Entered By: Mikeal Hawthorne on 08/10/2019 14:49:33 -------------------------------------------------------------------------------- Vitals Details Patient Name: Date of Service: [name]. 08/10/2019 10:00 AM Medical Record [id number] Patient Account Number: [id number] Date of Birth/Sex: Treating  RN: 1950-11-26 (69 y.o. [name] Primary Care [place] Oborn: [name] Other Clinician: Referring Lorrane Mccay: Treating Alyze Lauf/Extender:Stone III, [name], FRED Weeks in Treatment: 105 Vital Signs Time Taken: 10:52 Temperature (F): 98.9 Height (in): 62 Pulse (bpm): 77 Weight (lbs): 335 Respiratory Rate (breaths/min): 18 Body Mass Index (BMI): 61.3 Blood Pressure (mmHg): 106/39 Capillary Blood Glucose (mg/dl): 129 Reference Range: 80 - 120 mg / dl Notes glucose per pt report Electronic Signature(s) Signed: 08/11/2019 8:56:01 AM By: Levan Hurst RN, BSN Entered By: Levan Hurst on 08/10/2019 10:53:16

## 2019-08-16 NOTE — Progress Notes (Addendum)
Subjective:     Patient ID: [name], female    DOB: 1951-04-21, 69 y.o.   MRN: [id number]  Chief Complaint  Patient presents with  . Advice Only    for wound care eval on (B) legs    HPI: The patient is a 69 y.o. female here for left leg wound   Patient states she has a 4-year history of bilateral leg wounds.  She currently follows with the [facility].  She is using 3 layer compressions bilaterally and reports these are changed 3 times a week with home health. She is not currently putting anything on the wound.  Today she is concerned about her left leg wounds, the edema in the leg and the pain.  Her left leg wounds are chronic however the edema in the leg has worsened over the past couple of months and no longer resolves with elevation.  She now has acute pain for which she receives pain relievers by her primary care provider.    She has been on several rounds of antibiotics in the past year for possible cellulitis due to the wounds.  She is interested in surgical debridement and skin substitute for the left leg.  She denies fever/chills, purulent drainage or heat to the wound.   Review of Systems  All other systems reviewed and are negative.    has a past medical history of Anxiety, Arthritis, Asthma, Chronic lower back pain, Colonic polyp, Depressive disorder, Gastric polyp, Gout, Headache, History of hiatal hernia, Hypertension, Migraine, Mixed hyperlipidemia, Osteoarthritis, TIA (transient ischemic attack) (11/2014), and Type II diabetes mellitus ([place]).  has a past surgical history that includes Carpal tunnel release (Bilateral); Laparoscopic cholecystectomy; Knee arthroscopy (Right, 2003); Vaginal hysterectomy (1982); Dilation and curettage of uterus; Tubal ligation; Breast biopsy (Left, ~ 2015); and Artery Biopsy (Right, 08/12/2016).  reports that she has never smoked. She has never used smokeless tobacco. Objective:   Vital Signs BP 103/69 (BP Location:  Right Arm, Patient Position: Sitting, Cuff Size: Large)   Pulse 92   Temp 97.7 F (36.5 C) (Temporal)   SpO2 97%  Vital Signs and Nursing Note Reviewed Physical Exam  Skin:  Left Dorsal foot wound measures 8 x 12 x 0.5 cm Left leg wound measures 20 x 22 x 0.5 cm Venous stasis dermatitis bilaterally Weeping serous drainage in left leg wound throughout the wound Wound area is not hot or erythematous  Necrotic tissue noted throughout the wound        Assessment/Plan:     ICD-10-CM   1. Chronic skin ulcer of lower leg ([place])  L97.909   2. Left leg swelling  M79.89     Assessment: Left leg wound The left leg wounds appear to be from venous stasis ulcers.  Today there were no signs of infection.  Necrotic tissue noted throughout the wound. Patient reports a similar history to her right leg although currently the right leg looks great.    EMR reviewed and Last ABIs were done in 12/2018.  Right and left resting ankle brachial index and toe brachial index was within normal range. Patient has had bilateral lower extremity venous reflux studies without evidence of deep vein or great saphenous reflux done on 09/2016.    Patient was recently seen in the ED on 2/3 for her acute on chronic leg wounds and edema.  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  I am  concerned this is causing her acute on chronic leg swelling and pain as well as worsening of her leg wounds.  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  Will refer to Vascular.  She is wheelchair dependent due to back pain and leg weakness secondary to morbid obesity.  She currently uses compression layers and tries to elevate her legs when possible.  She is an insulin dependent type II diabetic and states that her glucose fluctuates.  Her albumin is low.  We discussed the importance of glucose control and diet in wound healing. As well as compression therapy, leg elevation and movement.      Dr. Ulice Bold was present during the encounter and the option of surgical debridement with skin substitute was discussed and patient would like to pursue this route of treatment.    At this time will refer to vascular first and have patient follow up in our office after to schedule the debridement.  Plan -Vaseline with ABD, gauze, Kerlix and Ace bandage dressing in office -Patient is to continue current care with wound care center  -Vascular referral - follow up with our office after vascular consult  Aldean Baker, DO 08/16/2019, 5:31 PM

## 2019-08-16 NOTE — Addendum Note (Signed)
Addended by: Geralyn Corwin R on: 08/16/2019 05:32 PM   Modules accepted: Orders

## 2019-08-17 ENCOUNTER — Encounter (HOSPITAL_BASED_OUTPATIENT_CLINIC_OR_DEPARTMENT_OTHER): Payer: Medicare PPO | Attending: Physician Assistant | Admitting: Physician Assistant

## 2019-08-17 DIAGNOSIS — J45909 Unspecified asthma, uncomplicated: Secondary | ICD-10-CM | POA: Diagnosis not present

## 2019-08-17 DIAGNOSIS — I87311 Chronic venous hypertension (idiopathic) with ulcer of right lower extremity: Secondary | ICD-10-CM | POA: Diagnosis not present

## 2019-08-17 DIAGNOSIS — I87332 Chronic venous hypertension (idiopathic) with ulcer and inflammation of left lower extremity: Secondary | ICD-10-CM | POA: Insufficient documentation

## 2019-08-17 DIAGNOSIS — L97822 Non-pressure chronic ulcer of other part of left lower leg with fat layer exposed: Secondary | ICD-10-CM | POA: Diagnosis not present

## 2019-08-17 DIAGNOSIS — L97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 leg with fat layer exposed: Secondary | ICD-10-CM | POA: Diagnosis not present

## 2019-08-17 DIAGNOSIS — I10 Essential (primary) hypertension: Secondary | ICD-10-CM | POA: Insufficient documentation

## 2019-08-17 DIAGNOSIS — Z6841 Body Mass Index (BMI) 40.0 and over, adult: Secondary | ICD-10-CM | POA: Diagnosis not present

## 2019-08-17 DIAGNOSIS — I890 Lymphedema, not elsewhere classified: Secondary | ICD-10-CM | POA: Insufficient documentation

## 2019-08-17 DIAGNOSIS — E1140 Type 2 diabetes mellitus with diabetic neuropathy, unspecified: Secondary | ICD-10-CM | POA: Insufficient documentation

## 2019-08-17 DIAGNOSIS — L97522 Non-pressure chronic ulcer of other part of left foot with fat layer exposed: Secondary | ICD-10-CM | POA: Insufficient documentation

## 2019-08-17 DIAGNOSIS — E11622 Type 2 diabetes mellitus with other skin ulcer: Secondary | ICD-10-CM | POA: Insufficient documentation

## 2019-08-17 DIAGNOSIS — E11621 Type 2 diabetes mellitus with foot ulcer: Secondary | ICD-10-CM | POA: Diagnosis not present

## 2019-08-17 DIAGNOSIS — I872 Venous insufficiency (chronic) (peripheral): Secondary | ICD-10-CM | POA: Diagnosis not present

## 2019-08-17 DIAGNOSIS — Z8673 Personal history of transient ischemic attack (TIA), and cerebral infarction without residual deficits: Secondary | ICD-10-CM | POA: Diagnosis not present

## 2019-08-17 NOTE — Progress Notes (Addendum)
ADDIE, ALONGE ([id number]) Visit Report for 08/17/2019 Arrival Information Details Patient Name: Date of Service: BALERIA, WYMAN 08/17/2019 12:30 PM Medical Record [id number] Patient Account Number: [id number] Date of Birth/Sex: Treating RN: [birth year] (69 y.o. Freddy Finner Primary Care Sayana Salley: Fatima Sanger Other Clinician: Referring Aly Hauser: Treating Weiland Tomich/Extender:Stone III, Flo Shanks, FRED Weeks in Treatment: 106 Visit Information History Since Last Visit All ordered tests and consults were completed: No Patient Arrived: Wheel Chair Added or deleted any medications: No Arrival Time: 13:29 Any new allergies or adverse reactions: No Accompanied By: self Had a fall or experienced change in No activities of daily living that may affect Transfer Assistance: None risk of falls: Patient Identification Verified: Yes Signs or symptoms of abuse/neglect since last No Secondary Verification Process Yes visito Completed: Hospitalized since last visit: No Patient Requires Transmission-Based No Implantable device outside of the clinic excluding No Precautions: cellular tissue based products placed in the center Patient Has Alerts: Yes since last visit: Patient Alerts: R ABI= Has Dressing in Place as Prescribed: Yes 1.01 Has Compression in Place as Prescribed: Yes L ABI = .99 Pain Present Now: No Electronic Signature(s) Signed: 08/17/2019 5:16:33 PM By: Yevonne Pax RN Entered By: Yevonne Pax on 08/17/2019 13:32:11 -------------------------------------------------------------------------------- Clinic Level of Care Assessment Details Patient Name: Date of Service: AHLIVIA, SALAHUDDIN 08/17/2019 12:30 PM Medical Record [id number] Patient Account Number: [id number] Date of Birth/Sex: Treating RN: 04/13/1951 (69 y.o. Arta Silence Primary Care Arayah Krouse: Fatima Sanger Other Clinician: Referring Lasandra Batley: Treating Daffney Greenly/Extender:Stone III, Flo Shanks,  FRED Weeks in Treatment: 106 Clinic Level of Care Assessment Items TOOL 4 Quantity Score X - Use when only an EandM is performed on FOLLOW-UP visit 1 0 ASSESSMENTS - Nursing Assessment / Reassessment X - Reassessment of Co-morbidities (includes updates in patient status) 1 10 X - Reassessment of Adherence to Treatment Plan 1 5 ASSESSMENTS - Wound and Skin Assessment / Reassessment []  - Simple Wound Assessment / Reassessment - one wound 0 X - Complex Wound Assessment / Reassessment - multiple wounds 3 5 X - Dermatologic / Skin Assessment (not related to wound area) 1 10 ASSESSMENTS - Focused Assessment X - Circumferential Edema Measurements - multi extremities 2 5 X - Nutritional Assessment / Counseling / Intervention 1 10 []  - Lower Extremity Assessment (monofilament, tuning fork, pulses) 0 []  - Peripheral Arterial Disease Assessment (using hand held doppler) 0 ASSESSMENTS - Ostomy and/or Continence Assessment and Care []  - Incontinence Assessment and Management 0 []  - Ostomy Care Assessment and Management (repouching, etc.) 0 PROCESS - Coordination of Care []  - Simple Patient / Family Education for ongoing care 0 X - Complex (extensive) Patient / Family Education for ongoing care 1 20 X - Staff obtains , Records, Test Results / Process Orders 1 10 []  - Staff telephones HHA, Nursing Homes / Clarify orders / etc 0 []  - Routine Transfer to another Facility (non-emergent condition) 0 []  - Routine Hospital Admission (non-emergent condition) 0 []  - New Admissions / / Ordering NPWT, Apligraf, etc. 0 []  - Emergency Hospital Admission (emergent condition) 0 []  - Simple Discharge Coordination 0 X - Complex (extensive) Discharge Coordination 1 15 PROCESS - Special Needs []  - Pediatric / Minor Patient Management 0 []  - Isolation Patient Management 0 []  - Hearing / Language / Visual special needs 0 []  - Assessment of Community assistance (transportation, D/C  planning, etc.) 0 []  - Additional assistance / Altered mentation 0 []  - Support Surface(s) Assessment (bed, cushion, seat, etc.) 0  INTERVENTIONS - Wound Cleansing / Measurement []  - Simple Wound Cleansing - one wound 0 X - Complex Wound Cleansing - multiple wounds 3 5 X - Wound Imaging (photographs - any number of wounds) 1 5 []  - Wound Tracing (instead of photographs) 0 []  - Simple Wound Measurement - one wound 0 X - Complex Wound Measurement - multiple wounds 3 5 INTERVENTIONS - Wound Dressings []  - Small Wound Dressing one or multiple wounds 0 X - Medium Wound Dressing one or multiple wounds 2 15 []  - Large Wound Dressing one or multiple wounds 0 []  - Application of Medications - topical 0 []  - Application of Medications - injection 0 INTERVENTIONS - Miscellaneous []  - External ear exam 0 []  - Specimen Collection (cultures, biopsies, blood, body fluids, etc.) 0 []  - Specimen(s) / Culture(s) sent or taken to Lab for analysis 0 []  - Patient Transfer (multiple staff / / Similar devices) 0 []  - Simple Staple / Suture removal (25 or less) 0 []  - Complex Staple / Suture removal (26 or more) 0 []  - Hypo / Hyperglycemic Management (close monitor of Blood Glucose) 0 []  - Ankle / Brachial Index (ABI) - do not check if billed separately 0 X - Vital Signs 1 5 Has the patient been seen at the hospital within the last three years: Yes Total Score: 175 Level Of Care: New/Established - Level 5 Electronic Signature(s) Signed: 08/22/2019 1:03:39 PM By: Previous Signature: 08/22/2019 1:02:56 PM Version By: Entered By: on 08/22/2019 13:03:38 -------------------------------------------------------------------------------- Encounter Discharge Information Details Patient Name: Date of Service: . 08/17/2019 12:30 PM Medical Record Patient Account Number: Date of Birth/Sex: Treating RN: 03-01-1951 (69 y.o. Primary Care Elisea Khader: Other Clinician: Referring Jamani Eley: Treating Keante Urizar/Extender:Stone III, , FRED Weeks in Treatment: 106 Encounter Discharge Information Items Discharge Condition: Stable Ambulatory Status: Wheelchair Discharge Destination: Home Transportation: Private Auto Accompanied By: self Schedule Follow-up Appointment: Yes Clinical Summary of Care: Electronic Signature(s) Signed: 08/22/2019 1:02:56 PM By: Shawn Stall Entered By: 10/22/2019 on 08/22/2019 13:01:29 -------------------------------------------------------------------------------- Lower Extremity Assessment Details Patient Name: Date of Service: ROSAMARIA, DONN 08/17/2019 12:30 PM Medical Record [name] Patient Account Number: 10/17/2019 Date of Birth/Sex: Treating RN: 02-22-1951 (69 y.o. 05/16/1951 Primary Care Geofrey Silliman: 64 Other Clinician: Referring Bobbi Yount: Treating Doretta Remmert/Extender:Stone III, Arta Silence, FRED Weeks in Treatment: 106 Edema Assessment Assessed: [Left: No] [Right: No] Edema: [Left: Yes] [Right: Yes] Calf Left: Right: Point of Measurement: 37 cm From Medial Instep 38 cm 34 cm Ankle Left: Right: Point of Measurement: 10 cm From Medial Instep 23 cm 23 cm Electronic Signature(s) Signed: 08/17/2019 5:16:33 PM By: Flo Shanks RN Entered By: 10/22/2019 on 08/17/2019 13:45:56 -------------------------------------------------------------------------------- Pain Assessment Details Patient Name: Date of Service: VERALYN, LOPP 08/17/2019 12:30 PM Medical Record [name] Patient Account Number: 10/17/2019 Date of Birth/Sex: Treating RN: [birth year] (68 y.o. 05/16/1951 Primary Care Myliyah Rebuck: 64 Other Clinician: Referring Adaleen Hulgan: Treating Diavion Labrador/Extender:Stone III, Freddy Finner, FRED Weeks in Treatment: 106 Active Problems Location of Pain Severity and Description of Pain Patient Has Paino  No Site Locations Pain Management and Medication Current Pain Management: Electronic Signature(s) Signed: 08/17/2019 5:16:33 PM By: Flo Shanks RN Entered By: 10/17/2019 on 08/17/2019 13:33:15 -------------------------------------------------------------------------------- Patient/Caregiver Education Details Patient Name: Date of Service: Yevonne Pax 3/3/2021andnbsp12:30 PM Medical Record Patient Account Number: [name] 10/17/2019 Number: Treating RN: [id number] Date of Birth/Gender: 05/12/1951 (68 y.o. F)  Other Clinician: Primary Care Physician: Katrinka Blazing, FRED Treating Lenda Kelp Referring Physician: Physician/Extender: Janna Arch in Treatment: 106 Education Assessment Education Provided To: Patient Education Topics Provided Wound/Skin Impairment: Handouts: Skin Care Do's and Dont's Methods: Explain/Verbal Responses: Reinforcements needed Electronic Signature(s) Signed: 08/22/2019 1:02:56 PM By: Shawn Stall Entered By: Shawn Stall on 08/22/2019 13:01:18 -------------------------------------------------------------------------------- Wound Assessment Details Patient Name: Date of Service: MICALAH, CABEZAS 08/17/2019 12:30 PM Medical Record [id number] Patient Account Number: [id number] Date of Birth/Sex: Treating RN: Nov 05, 1950 (69 y.o. Wynelle Link Primary Care Kaytie Ratcliffe: Fatima Sanger Other Clinician: Referring Huston Stonehocker: Treating Terea Neubauer/Extender:Stone III, Flo Shanks, FRED Weeks in Treatment: 106 Wound Status Wound Number: 61 Primary Venous Leg Ulcer Etiology: Wound Location: Left Lower Leg - Circumferential Wound Open Wounding Event: Gradually Appeared Status: Date Acquired: 04/20/2019 Comorbid Asthma, Hypertension, Peripheral Arterial Weeks Of Treatment: 17 History: Disease, Peripheral Venous Disease, Type II Clustered Wound: No Diabetes, Gout, Osteoarthritis Photos Wound Measurements Length: (cm) 19 % Reduct Width: (cm)  28 % Reduct Depth: (cm) 0.1 Epitheli Area: (cm) 628.315 Tunneli Volume: (cm) 41.783 Under Wound Description Classification: Full Thickness Without Exposed Support Structures Wound Flat and Intact Margin: Exudate Large Amount: Exudate Serous Type: Exudate amber Color: Wound Bed Granulation Amount: Small (1-33%) Granulation Quality: Pink Necrotic Amount: Large (67-100%) Necrotic Quality: Adherent Slough Foul Odor After Cleansing: No Slough/Fibrino Yes Exposed Structure Fascia Exposed: No Fat Layer (Subcutaneous Tissue) Exposed: Yes Tendon Exposed: No Muscle Exposed: No Joint Exposed: No Bone Exposed: No ion in Area: -5441.5% ion in Volume: -5441.5% alization: None ng: No mining: No Treatment Notes Wound #61 (Left, Circumferential Lower Leg) 1. Cleanse With Wound Cleanser Soap and water 4. Secondary Dressing ABD Pad Roll Gauze 5. Secured With Medipore tape Notes Patient had to leave her ride was at the door. patient did not want to miss her ride. PA aware and ordered to apply abd pads and kerlix. Per patient would call home health to come out apply dressings. Electronic Signature(s) Signed: 08/19/2019 4:01:29 PM By: Benjaman Kindler EMT/HBOT Signed: 08/22/2019 5:59:01 PM By: Zandra Abts RN, BSN Previous Signature: 08/17/2019 5:16:33 PM Version By: Yevonne Pax RN Entered By: Benjaman Kindler on 08/19/2019 14:12:14 -------------------------------------------------------------------------------- Wound Assessment Details Patient Name: Date of Service: [name]. 08/17/2019 12:30 PM Medical Record [id number] Patient Account Number: [id number] Date of Birth/Sex: Treating RN: 05-21-1951 (69 y.o. Wynelle Link Primary Care Margrit Minner: Fatima Sanger Other Clinician: Referring Berry Godsey: Treating Tyrus Wilms/Extender:Stone III, Flo Shanks, FRED Weeks in Treatment: 106 Wound Status Wound Number: 63 Primary Venous Leg Ulcer Etiology: Wound Location: Right  Malleolus - Medial Wound Open Wounding Event: Gradually Appeared Status: Date Acquired: 05/17/2019 Comorbid Asthma, Hypertension, Peripheral Arterial Weeks Of Treatment: 11 History: Disease, Peripheral Venous Disease, Type II Clustered Wound: No Diabetes, Gout, Osteoarthritis Photos Wound Measurements Length: (cm) 0.3 % Reduct Width: (cm) 0.4 % Reduct Depth: (cm) 0.1 Epitheli Area: (cm) 0.094 Tunneli Volume: (cm) 0.009 Undermi Wound Description Full Thickness Without Exposed Support Foul Od Classification: Structures Slough/ Wound Well defined, not attached Margin: Exudate Small Amount: Exudate Serosanguineous Type: Exudate red, brown Color: Wound Bed Granulation Amount: Large (67-100%) Granulation Quality: Pale Fascia E Necrotic Amount: None Present (0%) Fat Laye Tendon E Muscle E Joint Ex Bone Exp or After Cleansing: No Fibrino Yes Exposed Structure xposed: No r (Subcutaneous Tissue) Exposed: Yes xposed: No xposed: No posed: No osed: No ion in Area: 94.2% ion in Volume: 94.5% alization: Large (67-100%) ng: No ning: No Treatment Notes Wound #63 (Right, Medial Malleolus) 1. Cleanse  With Wound Cleanser Soap and water 4. Secondary Dressing ABD Pad Roll Gauze 5. Secured With [place] tape Notes Patient had to leave her ride was at the door. patient did not want to miss her ride. PA aware and ordered to apply abd pads and kerlix. Per patient would call home health to come out apply dressings. Electronic Signature(s) Signed: 08/19/2019 4:01:29 PM By: Mikeal Hawthorne EMT/HBOT Signed: 08/22/2019 5:59:01 PM By: Levan Hurst RN, BSN Previous Signature: 08/17/2019 5:16:33 PM Version By: Carlene Coria RN Entered By: Mikeal Hawthorne on 08/19/2019 14:12:44 -------------------------------------------------------------------------------- Wound Assessment Details Patient Name: Date of Service: [name] 08/17/2019 12:30 PM Medical Record [id number]  Patient Account Number: [id number] Date of Birth/Sex: Treating RN: 1951/01/15 (69 y.o. [name] Primary Care Ariell Gunnels: [name] Other Clinician: Referring Ananda Sitzer: Treating Lulla Linville/Extender:Stone III, [name], FRED Weeks in Treatment: 106 Wound Status Wound Number: 64 Primary Diabetic Wound/Ulcer of the Lower Extremity Etiology: Wound Location: Left Foot - Dorsal Wound Open Wounding Event: Gradually Appeared Status: Date Acquired: 06/01/2019 Comorbid Asthma, Hypertension, Peripheral Arterial Weeks Of Treatment: 11 History: Disease, Peripheral Venous Disease, Type II Clustered Wound: No Diabetes, Gout, Osteoarthritis Wound Measurements Length: (cm) 8.6 Width: (cm) 9 Depth: (cm) 0.1 Area: (cm) 60.79 Volume: (cm) 6.079 Wound Description Classification: Grade 1 Wound Margin: Flat and Intact Exudate Amount: Small Exudate Type: Serous Exudate Color: amber Wound Bed Granulation Amount: None Present (0%) Necrotic Amount: Large (67-100%) Necrotic Quality: Adherent Slough Foul Odor After Cleansing: N Slough/Fibrino Y Exposed Structure Fascia Exposed: Fat Layer (Subcutaneous Tissue) Exposed: Tendon Exposed: Muscle Exposed: Joint Exposed: Bone Exposed: % Reduction in Area: -36742.4% % Reduction in Volume: -12306.1% Epithelialization: None Tunneling: No Undermining: No o es No Yes No No No No Treatment Notes Wound #64 (Left, Dorsal Foot) 1. Cleanse With Wound Cleanser Soap and water 4. Secondary Dressing ABD Pad Roll Gauze 5. Secured With [place] tape Notes Patient had to leave her ride was at the door. patient did not want to miss her ride. PA aware and ordered to apply abd pads and kerlix. Per patient would call home health to come out apply dressings. Electronic Signature(s) Signed: 08/17/2019 5:16:33 PM By: Carlene Coria RN Entered By: Carlene Coria on 08/17/2019  13:47:36 -------------------------------------------------------------------------------- Vitals Details Patient Name: Date of Service: [name] 08/17/2019 12:30 PM Medical Record [id number] Patient Account Number: [id number] Date of Birth/Sex: Treating RN: Oct 22, 1950 (69 y.o. [name] Primary Care Jaleyah Longhi: [name] Other Clinician: Referring Kym Scannell: Treating Kharisma Glasner/Extender:Stone III, [name], FRED Weeks in Treatment: 106 Vital Signs Time Taken: 13:32 Temperature (F): 98.6 Height (in): 62 Pulse (bpm): 83 Weight (lbs): 335 Respiratory Rate (breaths/min): 18 Body Mass Index (BMI): 61.3 Blood Pressure (mmHg): 110/87 Reference Range: 80 - 120 mg / dl Electronic Signature(s) Signed: 08/17/2019 5:16:33 PM By: Carlene Coria RN Entered By: Carlene Coria on 08/17/2019 13:32:59

## 2019-08-18 ENCOUNTER — Other Ambulatory Visit: Payer: Self-pay

## 2019-08-19 ENCOUNTER — Other Ambulatory Visit: Payer: Self-pay | Admitting: Family

## 2019-08-19 DIAGNOSIS — R921 Mammographic calcification found on diagnostic imaging of breast: Secondary | ICD-10-CM

## 2019-08-24 ENCOUNTER — Other Ambulatory Visit: Payer: Self-pay

## 2019-08-24 ENCOUNTER — Encounter (HOSPITAL_BASED_OUTPATIENT_CLINIC_OR_DEPARTMENT_OTHER): Payer: Medicare PPO | Admitting: Physician Assistant

## 2019-08-24 ENCOUNTER — Telehealth (HOSPITAL_COMMUNITY): Payer: Self-pay

## 2019-08-24 DIAGNOSIS — E11622 Type 2 diabetes mellitus with other skin ulcer: Secondary | ICD-10-CM | POA: Diagnosis not present

## 2019-08-24 DIAGNOSIS — R29898 Other symptoms and signs involving the musculoskeletal system: Secondary | ICD-10-CM

## 2019-08-24 NOTE — Progress Notes (Signed)
HEMA, LANZA ([id number]) Visit Report for 08/17/2019 SuperBill Details Patient Name: Date of Service: [name], [name] 08/17/2019 Medical Record [id number] Patient Account Number: [id number] Date of Birth/Sex: Treating RN: September 18, 1950 (69 y.o. Arta Silence Primary Care Provider: Fatima Sanger Other Clinician: Referring Provider: Treating Provider/Extender:Stone III, Flo Shanks, FRED Weeks in Treatment: 106 Diagnosis Coding ICD-10 Codes Code Description E11.622 Type 2 diabetes mellitus with other skin ulcer I89.0 Lymphedema, not elsewhere classified I87.331 Chronic venous hypertension (idiopathic) with ulcer and inflammation of right lower extremity I87.332 Chronic venous hypertension (idiopathic) with ulcer and inflammation of left lower extremity L97.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 leg with fat layer exposed L97.822 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

## 2019-08-24 NOTE — Progress Notes (Addendum)
[name], [name] ([id number]) Visit Report for 08/24/2019 Arrival Information Details Patient Name: Date of Service: [name], [name] 08/24/2019 11:00 AM Medical Record [id number] Patient Account Number: [id number] Date of Birth/Sex: Treating RN: May 20, 1951 (69 y.o. [name] Primary Care Lukis Bunt: [name] Other Clinician: Referring Indie Boehne: Treating [name] [name]/Extender:Stone III, [name], FRED Weeks in Treatment: 107 Visit Information History Since Last Visit Added or deleted any medications: No Patient Arrived: Wheel Chair Any new allergies or adverse reactions: No Arrival Time: 11:15 Had a fall or experienced change in No activities of daily living that may affect Accompanied By: self risk of falls: Transfer Assistance: None Signs or symptoms of abuse/neglect since last No Patient Identification Verified: Yes visito Secondary Verification Process Yes Hospitalized since last visit: No Completed: Implantable device outside of the clinic excluding No Patient Requires Transmission-Based No cellular tissue based products placed in the center Precautions: since last visit: Patient Has Alerts: Yes Has Dressing in Place as Prescribed: Yes Patient Alerts: R ABI= Pain Present Now: Yes 1.01 L ABI = .99 Electronic Signature(s) Signed: 08/24/2019 5:34:34 PM By: Deon Pilling Entered By: Deon Pilling on 08/24/2019 11:16:35 -------------------------------------------------------------------------------- Compression Therapy Details Patient Name: Date of Service: [name], [name] 08/24/2019 11:00 AM Medical Record [id number] Patient Account Number: [id number] Date of Birth/Sex: Treating RN: 03/13/1951 (69 y.o. [name] Primary Care Lonney Revak: [name] Other Clinician: Referring Cali Cuartas: Treating Chianti Goh/Extender:Stone III, [name], FRED Weeks in Treatment: 107 Compression Therapy Performed for Wound Wound #61 Left,Circumferential  Lower Leg Assessment: Performed By: Clinician Carlene Coria, RN Compression Type: Three Layer Post Procedure Diagnosis Same as Pre-procedure Electronic Signature(s) Signed: 08/24/2019 5:43:44 PM By: [name] RN, BSN Entered By: [name] on 08/24/2019 11:47:14 -------------------------------------------------------------------------------- Compression Therapy Details Patient Name: Date of Service: [name] Duke. 08/24/2019 11:00 AM Medical Record [id number] Patient Account Number: [id number] Date of Birth/Sex: Treating RN: Aug 25, 1950 (69 y.o. [name] Primary Care Makira Holleman: [name] Other Clinician: Referring Glenora Morocho: Treating Jaela Yepez/Extender:Stone III, [name], FRED Weeks in Treatment: 107 Compression Therapy Performed for Wound NonWound Condition Lymphedema - Right Leg Assessment: Performed By: Clinician Carlene Coria, RN Compression Type: Three Layer Post Procedure Diagnosis Same as Pre-procedure Electronic Signature(s) Signed: 08/24/2019 5:43:44 PM By: [name] RN, BSN Entered By: [name] on 08/24/2019 11:49:04 -------------------------------------------------------------------------------- Encounter Discharge Information Details Patient Name: Date of Service: [name] Duke. 08/24/2019 11:00 AM Medical Record [id number] Patient Account Number: [id number] Date of Birth/Sex: Treating RN: 10-02-1950 (69 y.o. [name] Primary Care Porfirio Bollier: [name] Other Clinician: Referring Willis Kuipers: Treating Ivan Maskell/Extender:Stone III, [name], FRED Weeks in Treatment: 107 Encounter Discharge Information Items Discharge Condition: Stable Ambulatory Status: Wheelchair Discharge Destination: Home Transportation: Private Auto Accompanied By: self Schedule Follow-up Appointment: Yes Clinical Summary of Care: Electronic Signature(s) Signed: 08/24/2019 5:34:34 PM By: Deon Pilling Entered By: Deon Pilling on  08/24/2019 13:05:21 -------------------------------------------------------------------------------- Lower Extremity Assessment Details Patient Name: Date of Service: [name], [name] 08/24/2019 11:00 AM Medical Record [id number] Patient Account Number: [id number] Date of Birth/Sex: Treating RN: [birth year] (69 y.o. [name] Primary Care Shavelle Runkel: [name] Other Clinician: Referring Perez Dirico: Treating Mishal Probert/Extender:Stone III, [name], FRED Weeks in Treatment: 107 Edema Assessment Assessed: [Left: Yes] [Right: Yes] Edema: [Left: Yes] [Right: Yes] Calf Left: Right: Point of Measurement: 37 cm From Medial Instep 45 cm 37.5 cm Ankle Left: Right: Point of Measurement: 10 cm From Medial Instep 25 cm 22 cm Electronic Signature(s) Signed: 08/24/2019 5:34:34 PM By: Deon Pilling Entered By: Deon Pilling on 08/24/2019 11:20:14 --------------------------------------------------------------------------------  Multi-Disciplinary Care Plan Details Patient Name: Date of Service: [name], [name] 08/24/2019 11:00 AM Medical Record [id number] Patient Account Number: [id number] Date of Birth/Sex: Treating RN: 1951-05-24 (70 y.o. [name] Primary Care Saranda Legrande: [name] Other Clinician: Referring Keymani Mclean: Treating Sotirios Navarro/Extender:Stone III, [name], FRED Weeks in Treatment: 107 Active Inactive Venous Leg Ulcer Nursing Diagnoses: Actual venous Insuffiency (use after diagnosis is confirmed) Knowledge deficit related to disease process and management Goals: Patient will maintain optimal edema control Date Initiated: 08/12/2017 Target Resolution Date: 09/21/2019 Goal Status: Active Patient/caregiver will verbalize understanding of disease process and disease management Date Initiated: 08/12/2017 Date Inactivated: 04/21/2018 Target Resolution Date: 04/24/2018 Goal Status: Met Interventions: Assess peripheral edema status every visit. Compression as  ordered Treatment Activities: Therapeutic compression applied : 08/12/2017 Notes: Wound/Skin Impairment Nursing Diagnoses: Impaired tissue integrity Knowledge deficit related to ulceration/compromised skin integrity Goals: Patient/caregiver will verbalize understanding of skin care regimen Date Initiated: 08/12/2017 Target Resolution Date: 09/21/2019 Goal Status: Active Ulcer/skin breakdown will have a volume reduction of 30% by week 4 Date Initiated: 08/05/2017 Date Inactivated: 09/30/2017 Target Resolution Date: 10/03/2017 Goal Status: Met Ulcer/skin breakdown will have a volume reduction of 50% by week 8 Date Initiated: 09/30/2017 Date Inactivated: 10/28/2017 Target Resolution Date: 10/28/2017 Goal Status: Met Interventions: Assess patient/caregiver ability to perform ulcer/skin care regimen upon admission and as needed Assess ulceration(s) every visit Provide education on ulcer and skin care Screen for HBO Treatment Activities: Patient referred to home care : 08/05/2017 Skin care regimen initiated : 08/05/2017 Topical wound management initiated : 08/05/2017 Notes: Electronic Signature(s) Signed: 08/24/2019 5:43:44 PM By: [name] RN, BSN Entered By: [name] on 08/24/2019 11:45:29 -------------------------------------------------------------------------------- Pain Assessment Details Patient Name: Date of Service: [name] Duke. 08/24/2019 11:00 AM Medical Record [id number] Patient Account Number: [id number] Date of Birth/Sex: Treating RN: 11/10/1950 (69 y.o. [name] Primary Care Mardi Cannady: [name] Other Clinician: Referring Allyse Fregeau: Treating Maebell Lyvers/Extender:Stone III, [name], FRED Weeks in Treatment: 107 Active Problems Location of Pain Severity and Description of Pain Patient Has Paino Yes Site Locations Pain Location: Generalized Pain, Pain in Ulcers Rate the pain. Current Pain Level: 6 Worst Pain Level: 8 Least Pain Level:  0 Tolerable Pain Level: 8 Character of Pain Describe the Pain: Aching Pain Management and Medication Current Pain Management: Medication: Yes Cold Application: No Rest: Yes Massage: No Activity: No T.E.N.S.: No Heat Application: No Leg drop or elevation: Yes Is the Current Pain Management Adequate: Adequate How does your wound impact your activities of daily livingo Sleep: Yes Bathing: No Appetite: No Relationship With Others: No Bladder Continence: No Emotions: No Bowel Continence: No Work: No Toileting: No Drive: No Dressing: No Hobbies: Yes Electronic Signature(s) Signed: 08/24/2019 5:34:34 PM By: Deon Pilling Entered By: Deon Pilling on 08/24/2019 11:18:06 -------------------------------------------------------------------------------- Patient/Caregiver Education Details Patient Name: [name] Duke 3/10/2021andnbsp11:00 Date of Service: AM Medical Record [id number] Number: Patient Account Number: [id number] Treating RN: 01-02-1951 (69 y.o. [name] Date of Birth/Gender: F) Other Clinician: Primary Care Physician:SMITH, [place] III, [place] Referring Physician: Physician/Extender: Wilhelmenia Blase in Treatment: 107 Education Assessment Education Provided To: Patient Education Topics Provided Venous: Methods: Explain/Verbal Responses: Reinforcements needed, State content correctly Wound/Skin Impairment: Methods: Explain/Verbal Responses: Reinforcements needed, State content correctly Electronic Signature(s) Signed: 08/24/2019 5:43:44 PM By: [name] RN, BSN Entered By: [name] on 08/24/2019 11:45:53 -------------------------------------------------------------------------------- Wound Assessment Details Patient Name: Date of Service: [name] Duke. 08/24/2019 11:00 AM Medical Record [id number] Patient Account Number: [id number] Date of Birth/Sex: Treating  RN: May 21, 1951 (69 y.o. [name] Primary  Care Zeppelin Commisso: [name] Other Clinician: Referring Addysin Porco: Treating Kion Huntsberry/Extender:Stone III, [name], FRED Weeks in Treatment: 107 Wound Status Wound Number: 61 Primary Venous Leg Ulcer Etiology: Wound Location: Left Lower Leg - Circumferential Wound Open Wounding Event: Gradually Appeared Status: Date Acquired: 04/20/2019 Comorbid Asthma, Hypertension, Peripheral Arterial Weeks Of Treatment: 18 History: Disease, Peripheral Venous Disease, Type II Clustered Wound: No Diabetes, Gout, Osteoarthritis Photos Wound Measurements Length: (cm) 20 % Reduct Width: (cm) 30 % Reduct Depth: (cm) 0.1 Epitheli Area: (cm) 471.239 Tunneli Volume: (cm) 47.124 Undermi Wound Description Classification: Full Thickness Without Exposed Support Foul Odo Structures Slough/F Wound Flat and Intact Margin: Exudate Large Amount: Exudate Serous Type: Exudate amber Color: Wound Bed Granulation Amount: None Present (0%) Necrotic Amount: Large (67-100%) Fascia E Necrotic Quality: Adherent Slough Fat Laye Tendon E Muscle E Joint Ex Bone Exp r After Cleansing: No ibrino Yes Exposed Structure xposed: No r (Subcutaneous Tissue) Exposed: Yes xposed: No xposed: No posed: No osed: No ion in Area: -6149.9% ion in Volume: -6149.9% alization: None ng: No ning: No Treatment Notes Wound #61 (Left, Circumferential Lower Leg) 1. Cleanse With Wound Cleanser Soap and water 2. Periwound Care Barrier cream 3. Primary Dressing Applied Calcium Alginate Ag 4. Secondary Dressing ABD Pad Kerramax/Xtrasorb 6. Support Layer Applied 3 layer compression wrap Notes stockinette. Electronic Signature(s) Signed: 08/26/2019 4:47:30 PM By: Mikeal Hawthorne EMT/HBOT Signed: 08/26/2019 5:31:10 PM By: [name] RN, BSN Previous Signature: 08/24/2019 5:34:34 PM Version By: Deon Pilling Entered By: Mikeal Hawthorne on 08/26/2019  10:55:27 -------------------------------------------------------------------------------- Wound Assessment Details Patient Name: Date of Service: [name] Duke. 08/24/2019 11:00 AM Medical Record [id number] Patient Account Number: [id number] Date of Birth/Sex: Treating RN: 08/09/1950 (69 y.o. [name] Primary Care Elzabeth Mcquerry: [name] Other Clinician: Referring Trevon Strothers: Treating Keilon Ressel/Extender:Stone III, [name], FRED Weeks in Treatment: 107 Wound Status Wound Number: 63 Primary Venous Leg Ulcer Etiology: Wound Location: Right Malleolus - Medial Wound Healed - Epithelialized Wounding Event: Gradually Appeared Status: Date Acquired: 05/17/2019 Comorbid Asthma, Hypertension, Peripheral Arterial Weeks Of Treatment: 12 History: Disease, Peripheral Venous Disease, Type II Clustered Wound: No Diabetes, Gout, Osteoarthritis Photos Wound Measurements Length: (cm) 0 % Reduct Width: (cm) 0 % Reduct Depth: (cm) 0 Epitheli Area: (cm) 0 Volume: (cm) 0 Wound Description Classification: Full Thickness Without Exposed Support Foul Od Structures Slough/ Wound Well defined, not attached Margin: Exudate Small Amount: Exudate Serosanguineous Type: Exudate red, brown Color: Wound Bed Granulation Amount: Large (67-100%) Granulation Quality: Pale Fascia Exp Necrotic Amount: None Present (0%) Fat Layer Tendon Exp Muscle Exp Joint Expo Bone Expos or After Cleansing: No Fibrino Yes Exposed Structure osed: No (Subcutaneous Tissue) Exposed: Yes osed: No osed: No sed: No ed: No ion in Area: 100% ion in Volume: 100% alization: Large (67-100%) Electronic Signature(s) Signed: 08/25/2019 4:27:59 PM By: Mikeal Hawthorne EMT/HBOT Signed: 08/25/2019 6:00:33 PM By: Deon Pilling Previous Signature: 08/24/2019 5:34:34 PM Version By: Deon Pilling Entered By: Mikeal Hawthorne on 08/25/2019  16:27:33 -------------------------------------------------------------------------------- Wound Assessment Details Patient Name: Date of Service: [name] Duke. 08/24/2019 11:00 AM Medical Record [id number] Patient Account Number: [id number] Date of Birth/Sex: Treating RN: 02-07-1951 (69 y.o. [name] Primary Care Malayjah Otoole: [name] Other Clinician: Referring Tonjia Parillo: Treating Wilborn Membreno/Extender:Stone III, [name], FRED Weeks in Treatment: 107 Wound Status Wound Number: 64 Primary Diabetic Wound/Ulcer of the Lower Extremity Etiology: Wound Location: Left Foot - Dorsal Wound Open Wounding Event: Gradually Appeared Status: Date Acquired: 06/01/2019 Comorbid Asthma,  Hypertension, Peripheral Arterial Weeks Of Treatment: 12 History: Disease, Peripheral Venous Disease, Type II Clustered Wound: No Diabetes, Gout, Osteoarthritis Photos Wound Measurements Length: (cm) 8 Width: (cm) 8.5 Depth: (cm) 0.1 Area: (cm) 53.407 Volume: (cm) 5.341 Wound Description Classification: Grade 1 Wound Margin: Flat and Intact Exudate Amount: Small Exudate Type: Serous Exudate Color: amber Wound Bed Granulation Amount: None Present (0%) Necrotic Amount: Large (67-100%) Necrotic Quality: Adherent Slough Foul Odor After Cleansing: Slough/Fibrino Yes Exposed Structure Fascia Exposed: N Fat Layer (Subcutaneous Tissue) Exposed: Y Tendon Exposed: N Muscle Exposed: N Joint Exposed: N Bone Exposed: N % Reduction in Area: -32267.9% % Reduction in Volume: -10800% Epithelialization: None Tunneling: No Undermining: No No o es o o o o Treatment Notes Wound #64 (Left, Dorsal Foot) 1. Cleanse With Wound Cleanser Soap and water 2. Periwound Care Barrier cream 3. Primary Dressing Applied Calcium Alginate Ag 4. Secondary Dressing ABD Pad Kerramax/Xtrasorb 6. Support Layer Applied 3 layer compression wrap Notes stockinette. Electronic Signature(s) Signed:  08/26/2019 4:47:30 PM By: Mikeal Hawthorne EMT/HBOT Signed: 08/26/2019 5:31:10 PM By: [name] RN, BSN Previous Signature: 08/24/2019 5:34:34 PM Version By: Deon Pilling Entered By: Mikeal Hawthorne on 08/26/2019 10:54:56 -------------------------------------------------------------------------------- Vitals Details Patient Name: Date of Service: [name] Duke. 08/24/2019 11:00 AM Medical Record [id number] Patient Account Number: [id number] Date of Birth/Sex: Treating RN: 06-03-1951 (69 y.o. [name] Primary Care Shyteria Lewis: [name] Other Clinician: Referring Chima Astorino: Treating Maycee Blasco/Extender:Stone III, [name], FRED Weeks in Treatment: 107 Vital Signs Time Taken: 11:16 Temperature (F): 98.7 Height (in): 62 Pulse (bpm): 99 Weight (lbs): 335 Respiratory Rate (breaths/min): 18 Body Mass Index (BMI): 61.3 Blood Pressure (mmHg): 140/73 Reference Range: 80 - 120 mg / dl Electronic Signature(s) Signed: 08/24/2019 5:34:34 PM By: Deon Pilling Entered By: Deon Pilling on 08/24/2019 11:17:01

## 2019-08-24 NOTE — Progress Notes (Addendum)
[name], [name] ([id number]) Visit Report for 08/24/2019 Chief Complaint Document Details Patient Name: Date of Service: [name], [name] 08/24/2019 11:00 AM Medical Record [id number] Patient Account Number: [id number] Date of Birth/Sex: Treating RN: 02-Nov-1950 (69 y.o. [name] Primary Care Provider: [name] Other Clinician: Referring Provider: Treating Provider/Extender:Stone III, [name], FRED Weeks in Treatment: 107 Information Obtained from: Patient Chief Complaint Bilateral reoccurring LE ulcers Electronic Signature(s) Signed: 08/24/2019 11:13:46 AM By: [name] Keeler PA-C Entered By: [name] on 08/24/2019 11:13:46 -------------------------------------------------------------------------------- HPI Details Patient Name: Date of Service: [name]. 08/24/2019 11:00 AM Medical Record [id number] Patient Account Number: [id number] Date of Birth/Sex: Treating RN: 1950/10/03 (69 y.o. [name] Primary Care Provider: [name] Other Clinician: Referring Provider: Treating Provider/Extender:Stone III, [name], FRED Weeks in Treatment: 107 History of Present Illness HPI Description: this patient has been seen a couple of times before and returns with recurrent problems to her right and left lower extremity with swelling and weeping ulcerations due to not wearing her compression stockings which she had been advised to do during her last discharge, at the end of June 2018. During her last visit the patient had had normal arterial blood flow and her venous reflux study did not necessitate any surgical intervention. She was recommended compression and elevation and wound care. After prolonged treatment the patient was completely healed but she has been noncompliant with wearing or compressions.. She was here last week with an outpatient return visit planned but the patient came in a very poor general condition with altered  mental status and was rushed to the ER on my request. With a history of hypertension, diabetes, TIA and right-sided weakness she was set up for an MRI on her brain and cervical spine and was sent to [facility] Getting an MRI done was very difficult but once the workup was done she was found not to have any spinal stenosis, epidural abscess or hematoma or discitis. This was radiculopathy to be treated as an outpatient and she was given a follow-up appointment. Today she is feeling much better alert and oriented and has come to reevaluate her bilateral lower extremity lymphedema and ulceration 03/25/2017 -- she was admitted to the hospital on 03/16/2017 and discharged on 03/18/2017 with left leg cellulitis and ulceration. She was started on vancomycin and Zosyn and x-ray showed no bony involvement. She was treated for a cellulitis with IV antibiotics changed to Rocephin and Flagyl and was discharged on oral Keflex and doxycycline to complete a 7 day course. Last hemoglobin A1c was 7.1 and her other ailments including hypertension got asthma were appropriately treated. 05/06/2017 -- she is awaiting the right size of compression stockings from [place] but other than that has been doing well. ====== Old notes 69 year old patient was seen one time last October and was lost to follow-up. She has recurrent problems with weeping and ulceration of her left lower extremity and has swelling of this for several years. It has been worse for the last 2 months. Past medical history is significant for diabetes mellitus type 2, hypertension, gout, morbid obesity, depressive disorders, hiatal hernia, migraines, status post knee surgery, risk of a cholecystectomy, vaginal hysterectomy and breast biopsy. She is not a smoker. As noted before she has never had a venous duplex study and an arterial ABI study was attempted but the left lower extremity was noncompressible 10/01/2016 -- had a lower extremity venous  duplex reflux evaluation which showed no evidence of deep vein reflux  in the right or left lower extremity, and no evidence of great saphenous vein reflux more than 500 ms in the right or left lower extremity, and the left small saphenous vein is incompetent but no vascular consult was recommended. review of her electronic medical records noted that the ABI was checked in July 2017 where the right ABI was normal limits and the left ABI could not be ascertained due to pain with cuff pressure but the waveforms are within normal limits. her arterial duplex study scheduled for April 27. 10/08/2016 -- the patient has various reasons for not having a compression on and for the last 3 days she has had no compression on her left lower extremity either due to pain or the lack of nursing help. She does not use her juxta lites either. 10/15/2016 -- the patient did not keep her appointment for arterial duplex study on April 27 and I have asked her to reschedule this. Her pain is out of proportion with the physical findings and she continuously fails to wear a compression wraps and cuts them off because she says she cannot tolerate the pain. She does not use her juxta lites either. 10/22/2016 -- he has rescheduled her arterial duplex study to May 21 and her pain today is a bit better. She has not been wearing her juxta lites on her right lower extremity but now understands that she needs to do this. She did tolerate the to press compression wrap on her left lower extremity 10/29/2016 --arterial duplex study is scheduled for next week and overall she has been tolerating her compression wraps and also using her juxta lites on her right lower extremity 11/05/2016 -- the right ABI was 0.95 the left was 1.03. The digit TBI is on the right was 0.83 on the left was 0.92 and she had biphasic flow through these vessels. The impression was that of normal lower extremity arterial study. 11/12/2016 -- her pain is  minimal and she is doing very well overall. 11/26/2016 -- she has got juxta lites and her insurance will not pay for additional dual layer compression stockings. She is going to order some from [place]. 05/12/2017 -- her juxta lites are very old and too big for her and these have not been helping with compression. She did get 20-30 mm compression stockings from [place] but she and her husband are unable to put these on. I believe she will benefit from bilateral Extremit-ease, compression stockings and we will measure her for these today. 05/20/2017 -- lymphedema on the left lower extremity has increased a lot and she has a open ulceration as a result of this. The right lower extremity is looking pretty good. She has decided to by the compression stockings herself and will get reimbursed by the home health, at a later date. 05/27/2017 -- her sciatica is bothering her a lot and she thought her left leg pain was caused due to the compression wrap and hence removed it and has significant lymphedema. There is no inflammation on this left lower extremity. 06/17/17 on evaluation today patient appears to be doing very well and in fact is completely healed in regard to her ulcerations. Unfortunately however she does have continued issues with lymphedema nonetheless. We did order compression garments for her unfortunately she states that the size that she received were large although we ordered medium. Obviously this means she is not getting the optimal compression. She does not have those with her today and therefore we could not confirm and contact  the company on her behalf. Nonetheless she does state that she is going to have her husband bring them by tomorrow so that we can verify and then get in touch with the company. No fevers, chills, nausea, or vomiting noted at this time. Overall patient is doing better otherwise and I'm pleased with the progress she has made. 07/01/17 on evaluation today patient  appears to be doing very well in regard to her bilateral lower extremity she does not have any openings at this point which is excellent news. Overall I'm pleased with how things have progressed up to this time. Since she is doing so well we did order her compression which we are seeing her today to ensure that it fits her properly and everything is doing well in that regard and then subsequently she will be discharged. ============ Old Notes: 03/31/16 patient presents today for evaluation concerning open wounds that she has over the left medial ankle region as well as the left dorsal foot. She has previously had this occur although it has been healed for a number of months after having this for about a year prior until her hospitalization on 01/05/16. At that point in time it appears that she was admitted to the hospital for left lower extremity cellulitis and was placed on vancomycin and Zosyn at that point. Eventually upon discharge on January 15, 2016 she was placed on doxycycline at that point in time. Later on 03/27/16 positive wound culture growing Escherichia coli this was switched to amoxicillin. Currently she tells me that she is having pain radiated to be a 7 out of 10 which can be as high as 10 out of 10 with palpation and manipulation of the wound. This wound appears to be mainly venous in nature due to the bilateral lower extremity venous stasis/lymphedema. This is definitely much worse on her left than the right side. She does have type 1 diabetes mellitus, hypertension, morbid obesity, and is wheelchair dependent.during the course of the hospital stay a blood culture was also obtained and fortunately appeared negative. She also had an x-ray of the tibia/fibula on the left which showed no acute bone abnormality. Her white blood cell count which was performed last on 03/25/16 was 7.3, hemoglobin 12.8, protein 7.1, albumin 3.0. Her urine culture appeared to be negative for any specific  organisms. Patient did have a left lower extremity venous duplex evaluation for DVT. This did not include venous reflux studies but fortunately was negative for DVT. Patient also had arterial studies performed which revealed that she had a normal ABI on the right though this was unable to be performed on the left secondary to pain that she was having around the ankle region due to the wound. However it was stated on report that she had biphasic pulses and apparently good blood flow. ========== 06/03/17 she is here in follow-up evaluation for right lower extremity ulcer. The right lower sure he has healed but she has reopened to the left medial malleolus and dorsal foot with weeping. She is waiting for new compression garments to arrive from home health, the previous compression garments were ill fitting. We will continue with compression bilaterally and follow-up in 2 weeks Readmission: 08/05/17 on evaluation today patient appears to be doing somewhat poorly in regard to her left lower extremity especially although the right lower extremity has a small area which may no longer be open. She has been having a lot of drainage from the left lower extremity however he tells me that  she has not been able to use the EXTREMIT- EASE Compression at this point. She states that she did better and was able to actually apply the Juxta-Lite compression although the wound that she has is too large and therefore really does not compress which is why she cannot wear it at this point. She has no one who can help her put it on regular basis her son can sometimes but he's not able to do it most of the time. I do believe that's why she has begun to weave and have issues as she is currently yet again. No fevers, chills, nausea, or vomiting noted at this time. Patient is no evidence of dementia. 08/12/17 on evaluation today patient appears to still be doing fairly well in regard to the draining areas/weeping areas at  this point. With that being said she unfortunately did go to the ER yesterday due to what was felt to be possibly a cellulitis. They place her on doxycycline by mouth and discharge her home. She definitely was not admitted. With that being said she states she has had more discomfort which has been unusual for her even compared to prior times and she's had infections.08/12/17 on evaluation today patient appears to still be doing fairly well in regard to the draining areas/weeping areas at this point. With that being said she unfortunately did go to the ER yesterday due to what was felt to be possibly a cellulitis. They place her on doxycycline by mouth and discharge her home. She definitely was not admitted. With that being said she states she has had more discomfort which has been unusual for her even compared to prior times and she's had infections. 08/19/17 put evaluation today patient tells me that she's been having a lot of what sounds to be neuropathic type pain in regard to her left lower extremity. She has been using over-the-counter topical bins again which some believe. That in order to apply the she actually remove the wrap we put on her last Wednesday on Thursday. Subsequently she has not had anything on compression wise since that time. The good news is a lot of the weeping areas appear to have closed at this point again I believe she would do better with compression but we are struggling to get her to actually use what she needs to at this point. No fevers, chills, nausea, or vomiting noted at this time. 09/03/17 on evaluation today patient appears to be doing okay in regard to her lower extremities in regard to the lymphedema and weeping. Fortunately she does not seem to show any signs of infection at this point she does have a little bit of weeping occurring in the right medial malleolus area. With that being said this does not appear to be too significant which is good news. 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 She has severe skin damage secondary to both of these features involving the dorsal left foot and medial left ankle and lower leg. Still has open areas in the left anterior foot. The area on the right closed over. She uses her own juxta light stockings. She does not have an arterial issue 09/16/17 on evaluation today patient actually appears to be doing excellent in regard to her bilateral lower extremity swelling. The Juxta-Lite compression wrap seem to be doing very well for her. She has not however been using the portion that goes over her foot. Her left foot still is draining a  little bit not nearly as significant as it has been in the past but still I do believe that she likely needs to utilize the full wrap including the foot portion of this will improve as well. She also has been apparently putting on a significant amount of Vaseline which also think is not helpful for her. I recommended that if she feels she needs something for moisturizer Eucerin will probably be better. 09/30/17 on evaluation today patient presents with several new open areas in regard to her left lower extremity although these appear to be minimal and mainly seem to be more moisture breakdown than anything. Fortunately she does not seem to have any evidence of infection which is great news. She has been tolerating the dressing changes without complication we are using silver alginate on the foot she has been using AB pads to have the legs and using her Juxta-Lite compression which seems to be controlling her swelling very well. Overall I'm pleased with the poor way she has progressed. 10/14/17 on evaluation today patient appears to be doing better in regard to her left lower extremity areas of weeping. She does still have some discomfort although in general this does not appear to be as macerated and I think it  is progressing nicely. I do think she still needs to wear the foot portion of her Juxta-Lite in order to get the most benefit from the wrap obviously. She states she understands. Fortunately there does not appear to be evidence of infection at this time which is great news. 10/28/17 on evaluation today patient appears to be doing excellent in regard to her left lower extremity. She has just a couple areas that are still open and seem to be causing any trouble whatsoever. For that reason I think that she is definitely headed in the right direction the spots are very tiny compared to what we have been dealing with in the past. 11/11/17 on evaluation today patient appears to have a right lateral lower extremity ulcer that has opened since I last saw her. She states this is where the home health nurse that was coming out remove the dressing without wetting the alginate first. Nonetheless I do not know if this is indeed the case or not but more importantly we have not ordered home help to be coming out for her wounds at all. I'm unsure as to why they are coming out and we're gonna have to check on this and get things situated in that regard. With that being said we currently really do not need them to be coming out as the patient has been taking care of her leg herself without complication and no issues. In fact she was doing much better prior to nursing coming out. 11/25/17 on evaluation today patient actually appears to be doing fairly well in regard to her left lower extremity swelling. In fact she has very little area of weeping at this point there's just a small spot on the lateral portion of her right leg that still has me just a little bit more concerned as far as wanting to see this clear up before I discharge her to caring for this at home. Nonetheless overall she has made excellent progress. 12/09/17 on evaluation today patient appears to be doing rather well in regard to her lower extremity  edema. She does have some weeping still in the left lower extremity although the big area we were taking care of two weeks ago actually has closed and she has another  area of weeping on the left lower extremity immediately as well is the top of her foot. She does not currently have lymphedema pumps she has been wearing her compression daily on a regular basis as directed. With that being said I think she may benefit from lymphedema pumps. 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 We had a very difficult time getting and keeping this under control. 12/23/17 on evaluation today patient actually appears to be doing a little bit more poorly in regard to her bilateral lower extremities. She has been tolerating the Juxta-Lite compression wraps. Unfortunately she has two new ulcers on the right lower extremity and left lower Trinity ulceration seems to be larger. Obviously this is not good news. She has been tolerating the dressings without complication. 12/30/17 on evaluation today patient actually appears to be doing much better in regard to her bilateral lower extremity edema. She continues to have some issues with ulcerations and in fact there appears to be one spot on each leg where the wrap may have caused a little bit of a blister which is subsequently opened up at this point is given her pain. Fortunately it does not appear to be any evidence of infection which is good news. No fevers chills noted. 01/13/18 on evaluation today patient appears to be doing rather well in regard to her bilateral lower extremities. The dressings did get kind of stuck as far as the wound beds are concerned but again I think this is mainly due to the fact that she actually seems to be showing signs of healing which is good news. She's not having as much drainage  therefore she was having more of the dressing sticking. Nonetheless overall I feel like her swelling is dramatically down compared to previous. 01/20/18 on evaluation today patient unfortunately though she's doing better in most regards has a large blister on the left anterior lower extremity where she is draining quite significantly. Subsequently this is going to need debridement today in order to see what's underneath and ensure she does not continue to trapping fluid at this location. Nonetheless No fevers, chills, nausea, or vomiting noted at this time. 01/27/18 on evaluation today patient appears to be doing rather well at this point in regard to her right lower extremity there's just a very small area that she still has open at this point. With that being said I do believe that she is tolerating the compression wraps very well in making good progress. Home health is coming out at this point to see her. Her left lower extremity on the lateral portion is actually what still mainly open and causing her some discomfort for the most part 02/10/18 on evaluation today patient actually appears to be doing very well in regard to her right lower extremity were all the ulcers appear to be completely close. In regard to the left lower extremity she does have two areas still open and some leaking from the dorsal surface of her foot but this still seems to be doing much better to me in general. 02/24/18 on evaluation today patient actually appears to be doing much better in regard to her right lower extremity this is still completely healed. Her left lower extremity is also doing much better fortunately she has no evidence of infection. The one area that is gonna require some debridement is still  on the left anterior shin. Fortunately this is not hurting her as badly today. 03/10/18 on evaluation today patient appears to be doing better in some regards although she has a little bit more open area on the dorsal  foot and she also has some issues on the medial portion of the left lower extremity which is actually new and somewhat deep. With that being said there fortunately does not appear to be any significant signs of infection which is good news. No fevers, chills, nausea, or vomiting noted at this time. In general her swelling seems to be doing fairly well which is good news. 03/31/18 on evaluation today patient presents for follow-up concerning her left lower extremity lymphedema. Unfortunately she has been doing a little bit more poorly since I last saw her in regard to the amount of weeping that she is experiencing. She's also having some increased pain in the anterior shin location. Unfortunately I do not feel like the patient is making such good progress at this point a few weeks back she was definitely doing much better. 04/07/18 on evaluation today patient actually appears to be showing some signs of improvement as far as the left lower extremity is concerned. She has been tolerating the dressing changes and it does appear that the Drawtex did better for her. With that being said unfortunately home health is stating that they cannot obtain the Drawtex going forward. Nonetheless we're gonna have to check and see what they may be able to get the alginate they were using was getting stuck in causing new areas of skin being pulled all that with and subsequently weep and calls her to worsen overall this is the first time we've seen improvement at this time. 04/14/18 on evaluation today patient actually appears to be doing rather well at this point there does not appear to be any evidence of infection at this time and she is actually doing excellent in regard to the weeping in fact she almost has no openings remaining even compared to just last week this is a dramatic improvement. No fevers chills noted 04/21/18 evaluation today patient actually appears to be doing very well. She in fact is has a small  area on the posterior lower extremity location and she has a small area on the dorsal surface of her foot that are still open both of which are very close to closing. We're hoping this will be close shortly. She brought her Juxta-Lite wrap with her today hoping that would be able to put her in it unfortunately I don't think were quite at that point yet but we're getting closer. 04/28/18 upon evaluation today patient actually appears to be doing excellent in regard to her left lower extremity ulcer. In fact the region on the posterior lower extremity actually is much smaller than previously noted. Overall I'm very happy with the progress she has made. She again did bring her Juxta-Lite although we're not quite ready for that yet. 05/11/18 upon evaluation today patient actually appears to be doing in general fairly well in regard to her left lower Trinity. The swelling is very well controlled. With that being said she has a new area on the left anterior lower extremity as well as between the first and second toes of her left foot that was not present during the last evaluation. The region of her posterior left lower extremity actually appears to be almost completely healed. To be honest I'm very pleased with the way that stands. Nonetheless I do believe  that the lotion may be keeping the area to moist as far as her legs are concerned subsequently I'm gonna consider discontinuing that today. 05/26/18 on evaluation today patient appears to be doing rather well in regard to her left lower should be ulcers. In fact everything appears to be close except for a very small area on the left posterior lower extremity. Fortunately there does not appear to be any evidence of infection at this time. Overall very pleased with her progress. 06/02/18 and evaluation today patient actually appears to be doing very well in regard to her lower extremity ulcers. She has one small area that still continues to weep that I  think may benefit her being able to justify lotion and user Juxta-Lite wraps versus continued to wrap her. Nonetheless I think this is something we can definitely look into at this point. 06/23/18 on evaluation today patient unfortunately has openings of her bilateral lower extremities. In general she seems to be doing much worse than when I last saw her just as far as her overall health standpoint is concerned. She states that her discomfort is mainly due to neuropathy she's not having any other issues otherwise. No fevers, chills, nausea, or vomiting noted at this time. 06/30/18 on evaluation today patient actually appears to be doing a little worse in regard to her right lower extremity her left lower extremity of doing fairly well. Fortunately there is no sign of infection at this time. She has been tolerating the dressing changes without complication. Home health did not come out like they were supposed to for the appropriate wrap changes. They stated that they never received the orders from [place] which were fax. Nonetheless we will send a copy of the orders with the patient today as well. 07/07/18 on evaluation today patient appears to be doing much better in regard to lower extremities. She still has several openings bilaterally although since I last saw her her legs did show obvious signs of infection when she later saw her nurse. Subsequently a culture was obtained and she is been placed on Bactrim and Keflex. Fortunately things seem to be looking much better it does appear she likely had an infection. Again last week we'd even discussed it but again there really was not any obvious sign that she had infection therefore we held off on the antibiotics. Nonetheless I'm glad she's doing better today. 07/14/18 on evaluation today patient appears to be doing much better regarding her bilateral lower Trinity's. In fact on the right lower for me there's nothing open at this point there are some dry skin  areas at the sites where she had infection. Fortunately there is no evidence of systemic infection which is excellent news. No fevers chills noted 07/21/18 on evaluation today patient actually appears to be doing much better in regard to her left lower extremity ulcers. She is making good progress and overall I feel like she's improving each time I see her. She's having no pain I do feel like the infection is completely resolved which is excellent news. No fevers, chills, nausea, or vomiting noted at this time. 07/28/18 on evaluation today patient appears to be doing very well in regard to her left lower [employer]. Everything seems to be showing signs of improvement which is excellent news. Overall very pleased with the progress that has been made. Fortunately there's no evidence of active infection at this time also excellent news. 08/04/18 on evaluation today patient appears to be doing more poorly in regard  to her bilateral lower extremities. She has two new areas open up on the right and these were completely closed as of last week. She still has the two spots on the left which in my pinion seem to be doing better. Fortunately there's no evidence of infection again at this point. 08/11/18 on evaluation today patient actually appears to be doing very well in regard to her bilateral lower Trinity wounds that all seem to be doing better and are measures smaller today. Fortunately there's no signs of infection. No fevers, chills, nausea, or vomiting noted at this time. 08/18/18 on evaluation today patient actually appears to be doing about the same inverter bilateral lower extremities. She continues to have areas that blistering open as was drain that fortunately nothing too significant. Overall I feel like Drawtex may have done better for her however compared to the collagen. 08/25/18 on evaluation today patient appears to be doing a little bit more poorly today even compared to last time I saw  her. Again I'm not exactly sure why she's making worse progress over the past several weeks. I'm beginning to wonder if there is some kind of underlying low level infection causing this issue. I did actually take a culture from the left anterior lower extremity but it was a new wound draining quite a bit at this point. Unfortunately she also seems to be having more pain which is what also makes me worried about the possibility of infection. This is despite never erythema noted at this point. 09/01/18 on evaluation today patient actually appears to be doing a little worse even compared to last week in regard to bilateral lower extremities. She did go to the hospital on the 16th was given a dose of IV Zosyn and then discharged with a recommendation to continue with the Bactrim that I previously prescribed for her. Nonetheless she is still having a lot of discomfort she tells me as well at this time. This is definitely unfortunate. No fevers, chills, nausea, or vomiting noted at this time. 09/08/18 on evaluation today patient's bilateral lower extremities actually appear to be shown signs of improvement which is good news. Fortunately there does not appear to be any signs of active infection I think the anabiotic is helping in this regard. Overall I'm very pleased with how she is progressing. 09/15/18 patient was actually seen in ER yesterday due to her legs as well unfortunately. She states she's been having a lot of pain and discomfort as well as a lot of drainage. Upon inspection today the patient does have a lot of swelling and drainage I feel like this is more related to lymphedema and poor fluid control than it is to infection based on what I'm seeing. The physician in the emergency department also doubted that the patient was having a significant infection nonetheless I see no evidence of infection obvious at this point although I do see evidence of poor fluid control. She still not using a  compression pumps, she is not elevating due to her lift chair as well as her hospital bed being broken, and she really is not keeping her legs up as much as they should be and also has been taking off her wraps. All this combined I think has led to poor fluid control and to be honest she may be somewhat volume overloaded in general as well. I recommend that she may need to contact your physician to see if a prescription for a diuretic would be beneficial in their opinion.  As long as this is safe I think it would likely help her. 09/29/18 on evaluation today patient's left lower extremity actually appears to be doing quite a bit better. At least compared to last time that I saw her. She still has a large area where she is draining from but there's a lot of new skin speckled trout and in fact there's more new skin that there are open areas of weeping and drainage at this point. This is good news. With regard to the right lower extremity this is doing much better with the only open area that I really see being a dry spot on the right lateral ankle currently. Fortunately there's no signs of active infection at this time which is good news. No fevers, chills, nausea, or vomiting noted at this time. The patient seems somewhat stressed and overwhelmed during the visit today she was very lethargic as such. She does and she is not taking any pain medications at this point. Apparently according to her husband are also in the process of moving which is probably taking its toll on her as well. 10/06/18 on evaluation today patient appears to be doing rather well in regard to her lower extremities compared to last evaluation. Fortunately there's no signs of active infection. She tells me she did have an appointment with her primary. Nonetheless he was concerned that the wounds were somewhat deep based on pictures but we never actually saw her legs. She states that he had her somewhat worried due to the fact that  she was fearing now that she was [place] have to have an amputation. With that being said based on what I'm seeing check she looks better this week that she has the last two times I've seen her with much less drainage I'm actually pleased in this regard. That doesn't mean that she's out of the water but again I do not think what the point of talking about education at all in regard to her leg. She is very happy to hear this. She is also not having as much pain as she was having last week. 10/13/18 unfortunately on evaluation today patient still continues to have a significant amount of drainage she's not letting home health actually apply the compression dressings at this point. She's trying to use of Juxta-Lite of the top of Kerlex and the second layer of the three layer compression wrap. With that being said she just does not seem to be making as good a progress as I would expect if she was having the compression applied and in place on a regular basis. No fevers, chills, nausea, or vomiting noted at this time. 10/20/18 on evaluation today patient appears to be doing a little better in regard to her bilateral lower extremity ulcers. In fact the right lower extremity seems to be healed she doesn't even have any openings at this point left lower extremity though still somewhat macerated seems to be showing signs of new skin growth at multiple locations throughout. Fortunately there's no evidence of active infection at this time. No fevers, chills, nausea, or vomiting noted at this time. 10/27/18 on evaluation today patient appears to be doing much better in regard to her left lower Trinity ulcer. She's been tolerating the laptop complication and has minimal drainage noted at this point. Fortunately there's no signs of active infection at this time. No fevers, chills, nausea, or vomiting noted at this time. 11/03/18 on evaluation today patient actually appears to be doing excellent in regard to  her left  lower extremity. She is having very little drainage at this point there does not appear to be any significant signs of infection overall very pleased with how things have gone. She is likewise extremely pleased still and seems to be making wonderful progress week to week. I do believe antibiotics were helpful for her. Her primary care provider did place on amateur clean since I last saw her. 11/17/18 on evaluation today patient appears to be doing worse in regard to her bilateral lower extremities at this point. She is been tolerating the dressing changes without complication. With that being said she typically takes the Coban off fairly quickly upon arriving home even after being seen here in the clinic and does not allow home health reapply command as part of the dressing at home. Therefore she said no compression essentially since I last saw her as best I can tell. With that being said I think it shows and how much swelling she has in the open wounds that are noted at this point. Fortunately there's no signs of infection but unfortunately if she doesn't get this under control I think she will end up with infection and more significant issues. 11/24/18 on evaluation today patient actually appears to be doing somewhat better in regard to her bilateral lower extremities. 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 Nonetheless that is doing better currently so she should be able to attempt reinitiating the lymphedema pumps at this time. No fevers, chills, nausea, or vomiting noted at this time. 12/01/18 upon evaluation today patient's left lower extremity appears to be doing quite well unfortunately her right lower extremity is not doing nearly as well. She has been tolerating the dressing changes without complication unfortunately she did not keep a wrap on the  right at this time. Nonetheless I believe this has led to increased swelling and weeping in the world is actually much larger than during the last evaluation with her. 12/08/18 on evaluation today patient appears to be doing about the same at this point in regard to her right lower extremity. There is some more palatable to touch I'm concerned about the possibility of there being some infection although I think the main issue is she's not keeping her compression wrap on which in turn is not allowing this area to heal appropriately. 12/22/18 on evaluation today patient appears to be doing better in regard to left lower extremity unfortunately significantly worse in regard to the right lower extremity. The areas of blistering and necrotic superficial tissue have spread and again this does not really appear to be signs of infection and all she just doesn't seem to be doing nearly as well is what she has been in the past. Overall I feel like the Augmentin did absolutely nothing for her she doesn't seem to have any infection again I really didn't think so last time either is more of a potential preventative measure and hoping that this would make some difference but I think the main issue is she's not wearing her compression. She tells me she cannot wear the [place] been we put on she takes it off pretty much upon getting home. Subsequently she worshiped Juxta-Lite when I questioned her about how often she wears it this is no more than three hours a day obviously that leaves 21  hours that she has no compression and this is obviously not doing well for her. Overall I'm concerned that if things continue to worsen she is at great risk of both infection as well as losing her leg. 01/05/19 on evaluation today patient appears to be doing well in regard to her left lower extremity which he is allowing [place] to wrap and not so well with regard to her right lower extremity which she is not allowing [place] to really wrap  and keep the wrap on. She states that it hurts too badly whenever it's wrapped and she ends up having to take it off. She's been using the Juxta-Lite she tells me up to six hours a day although I question whether or not that's really been the case to be honest. Previously she told me three hours today nonetheless obviously the legs as long as the wrap is doing great when she is not is doing much more poorly. 01/12/2019 on evaluation today patient actually appears to be doing a little better in my opinion with regard to her right lower extremity ulcer. She has a small open area on the left lower extremity unfortunately but again this I think is part of the normal fluctuation of what she is going to have to expect with regard to her legs especially when she is not using her lymphedema pumps on a regular basis. Subsequently based on what I am seeing today I think that she does seem to be doing slightly better with regard to her right lower extremity she did see her primary care provider on Monday they felt she had an infection and placed her on 2 antibiotics. Both Cipro and clindamycin. Subsequently again she seems possibly to be doing a little bit better in regards to the right lower extremity she also tells me however she has been wearing the compression wrap over the past week since I spoke with her as well that is a Kerlix and Coban wrap on the right. No fevers, chills, nausea, vomiting, or diarrhea. 01/19/2019 on evaluation today patient appears to be doing better with regard to her bilateral lower extremities especially the right. I feel like the compression has been beneficial for her which is great news. She did get a call from her primary care provider on her way here today telling her that she did have methicillin-resistant Staphylococcus aureus and he was calling in a couple new antibiotics for her including a ointment to be applied she tells me 3 times a day. With that being said this sounds  like likely to be Bactroban which I think could be applied with each dressing/wrap change but I would not be able to accommodate her applying this 3 times a day. She is in agreement with the least doing this we will add that to her orders today. 01/26/2019 on evaluation today patient actually appears to be doing much better with regard to her right lower extremity. Her left lower extremity is also doing quite well all things considering. Fortunately there is no evidence of active infection at this time. No fevers, chills, nausea, vomiting, or diarrhea. 02/02/2019 on evaluation today patient appears to be doing much better compared to her last evaluation. Little by little off like her right leg is returning more towards normal. There does not appear to be any signs of active infection and overall she seems to be doing quite well which is great news. I am very pleased in this regard. No fevers, chills, nausea, vomiting, or diarrhea. 02/09/2019  upon evaluation today patient appears to be doing better with regard to her bilateral lower extremities. She has been tolerating the dressing changes without complication. Fortunately there is no signs of active infection at this time. No fevers, chills, nausea, vomiting, or diarrhea. 02/23/2019 on evaluation today patient actually appears to be doing quite well with regard to her bilateral lower extremities. She has been tolerating the dressing changes without complication. She is even used her pumps one time and states that she really felt like it felt good. With that being said she seems to be in good spirits and her legs appear to be doing excellent. 03/09/2019 on evaluation today patient appears to be doing well with regard to her right lower extremity there are no open wounds at this time she is having some discomfort but I feel like this is more neuropathy than anything. With regard to her left lower extremity she had several areas scattered around that she  does have some weeping and drainage from but again overall she does not appear to be having any significant issues and no evidence of infection at this time which is good news. 03/23/2019 on evaluation today patient appears to be doing well with regard to her right lower extremity which she tells me is still close she is using her juxta light here. Her left lower extremity she mainly just has an area on the foot which is still slightly draining although this also is doing great. Overall very pleased at this time. 04/06/2019 patient appears to be doing a little bit worse in regard to her left lower extremity upon evaluation today. She feels like this could be becoming infected again which she had issues with previous. Fortunately there is no signs of systemic infection but again this is always a struggle with her with her legs she will go from doing well to not so well in a very short amount of time. 04/20/2019 on evaluation today patient actually appears to be doing quite well with regard to her right lower extremity I do not see any signs of active infection at this time. Fortunately there is no fever chills noted. She is still taking the antibiotics which I prescribed for her at this point. In regard to the left lower extremity I do feel like some of these areas are better although again she still is having weeping from several locations at this time. 04/27/2019 on evaluation today patient appears to be doing about the same if not slightly worse in regard to her left lower extremity ulcers. She tells me when questioned that she has been sleeping in her Hoveround chair in fact she tells me she falls asleep without even knowing it. I think she is spending a whole lot of time in the chair and less time walking and moving around which is not good for her legs either. On top of that she is in a seated position which is also the worst position she is not really elevating her legs and she is also not  using her lymphedema pumps. All this is good to contribute to worsening of her condition in general. 05/18/2019 on evaluation today patient appears to be doing well with regard to her lower extremity on the right in fact this is showing no signs of any open wounds at this time. On the left she is continuing to have issues with areas that do drain. Some of the regions have healed and there are couple areas that have reopened. She did go to  the ER per the patient according to recommendations from the home health nurse due to what she was seen when she came out on 05/13/2019. Subsequently she felt like the patient needed to go to the hospital due to the fact that again she was having "milky white discharge" from her leg. Nonetheless she had and then was placed on doxycycline and subsequently seems to be doing better. 06/01/2019 upon evaluation today patient appears to be doing really in my opinion about the same. I do not see any signs of active infection which is good news. Overall she still has wounds over the bilateral lower extremities she has reopened on the right but this appears to be more of a crack where there is weeping/edema coming from the region. I do not see any evidence of infection at either site based on what I visualized today. 07/13/2019 upon evaluation today patient appears to be doing a little worse compared to last time I saw her. She since has been in the hospital from 06/21/2019 through 06/29/2019. This was secondary to having Covid. During that time they did apply lotion to her legs which unfortunately has caused her to develop a myriad of open wounds on her lower extremities. Her legs do appear to be doing better as far as the overall appearance is concerned but nonetheless she does have more open and weeping areas. 07/27/2019 upon evaluation today patient appears to be doing more poorly to be honest in regard to her left lower extremity in particular. There is no signs of  systemic infection although I do believe she may have local infection. She notes she has been having a lot of blue/green drainage which is consistent potentially with Pseudomonas. That may be something that we need to consider here as well. The doxycycline does not seem to have been helping. 08/03/2019 upon evaluation today patient appears to be doing a little better in my opinion compared to last week's evaluation. Her culture I did review today and she is on appropriate medications to help treat the Enterobacter that was noted. Overall I feel like that is good news. With that being said she is unfortunately continuing to have a lot of drainage and though it is doing better I still think she has a long ways to go to get things dried up in general. Fortunately there is no signs of systemic infection. 08/10/2019 upon evaluation today patient appears to be doing may be slightly better in regard to her left lower extremity the right lower extremity is doing much better. Fortunately there is no signs of infection right now which is good news. No fevers, chills, nausea, vomiting, or diarrhea. 08/24/2019 on evaluation today patient appears to be doing slightly better in regard to her lower extremities. The left lower extremity seems to be healed the right lower extremity is doing better though not completely healed as far as the openings are concerned. She has some generalized issues here with edema and weeping secondary to her lymphedema though again I do believe this is little bit drier compared to prior weeks evaluations. In general I am very pleased with how things seem to be progressing. No fevers, chills, nausea, vomiting, or diarrhea. Electronic Signature(s) Signed: 08/24/2019 12:59:16 PM By: [name] Keeler PA-C Entered By: [name] on 08/24/2019 12:59:16 -------------------------------------------------------------------------------- Physical Exam Details Patient Name: Date of  Service: TEEA, DUCEY 08/24/2019 11:00 AM Medical Record [id number] Patient Account Number: [id number] Date of Birth/Sex: Treating RN: 10-Sep-1950 (69 y.o. F) [name] Primary  Care Provider: [name] Other Clinician: Referring Provider: Treating Provider/Extender:Stone III, [name], FRED Weeks in Treatment: 10 Constitutional Well-nourished and well-hydrated in no acute distress. Respiratory normal breathing without difficulty. Psychiatric this patient is able to make decisions and demonstrates good insight into disease process. Alert and Oriented x 3. pleasant and cooperative. Notes Patient's wound bed currently showed signs of good granulation at this time. Fortunately there is no evidence of active infection which is great news. I do believe she is making progress which is great news. Overall I am very pleased with the appearance today. Electronic Signature(s) Signed: 08/24/2019 1:00:14 PM By: [name] Keeler PA-C Entered By: [name] on 08/24/2019 13:00:14 -------------------------------------------------------------------------------- Physician Orders Details Patient Name: Date of Service: [name]. 08/24/2019 11:00 AM Medical Record [id number] Patient Account Number: [id number] Date of Birth/Sex: Treating RN: [birth year] (68 y.o. [name] Primary Care Provider: [name] Other Clinician: Referring Provider: Treating Provider/Extender:Stone III, [name], FRED Weeks in Treatment: [id number] Verbal / Phone Orders: No Diagnosis Coding ICD-10 Coding Code Description E11.622 Type 2 diabetes mellitus with other skin ulcer I89.0 Lymphedema, not elsewhere classified I87.331 Chronic venous hypertension (idiopathic) with ulcer and inflammation of right lower extremity I87.332 Chronic venous hypertension (idiopathic) with ulcer and inflammation of left lower extremity L97.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  leg with fat layer exposed L97.822 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 I10 Essential (primary) hypertension E66.01 Morbid (severe) obesity due to excess calories F41.8 Other specified anxiety disorders R53.1 Weakness Follow-up Appointments Return Appointment in 1 week. Dressing Change Frequency Change dressing three times week. Other: - may remove wrap from right leg and wear juxtalite Skin Barriers/Peri-Wound Care Barrier cream - zinc oxide cream to any macerated areas Wound Cleansing Clean wound with Wound Cleanser - all wounds May shower with protection. Primary Wound Dressing Wound #61 Left,Circumferential Lower Leg Calcium Alginate with Silver Wound #64 Left,Dorsal Foot Calcium Alginate with Silver Secondary Dressing Wound #61 Left,Circumferential Lower Leg Dry Gauze - all wounds ABD pad - as needed Zetuvit or Kerramax - as needed Wound #64 Left,Dorsal Foot Dry Gauze - all wounds ABD pad - as needed Zetuvit or Kerramax - as needed Edema Control 3 Layer Compression System - Bilateral - in clinic, home health to reapply left leg wrap only Avoid standing for long periods of time - walking is encouraged Elevate legs to the level of the heart or above for 30 minutes daily and/or when sitting, a frequency of: - do not sleep in chair with feet dangling, MUST elevate legs while sitting Exercise regularly Support Garment 20-30 mm/Hg pressure to: - Juxtalite to right lower leg once wrap removed Segmental Compressive Device. - lymphedema pumps 60 minutes 1- 2 times per day Off-Loading Turn and reposition every 2 hours Additional Orders / Instructions Follow Nutritious Diet - To include vitamin A, vitamin C, and Zinc along with increased protein intake. [facility] for wound care. - [facility] Electronic  Signature(s) Signed: 08/24/2019 5:29:37 PM By: [name] Keeler PA-C Signed: 08/24/2019 5:43:44 PM By: [name] RN, BSN Entered By: [name] on 08/24/2019 11:53:04 -------------------------------------------------------------------------------- Problem List Details Patient Name: Date of Service: [name]. 08/24/2019 11:00 AM Medical Record [id number] Patient Account Number: [id number] Date of Birth/Sex: Treating RN: 1950-07-15 (69 y.o. [name] Primary Care Provider: Tamala Julian,  FRED Other Clinician: Referring Provider: Treating Provider/Extender:Stone III, [name], FRED Weeks in Treatment: 107 Active Problems ICD-10 Evaluated Encounter Code Description Active Date Today Diagnosis E11.622 Type 2 diabetes mellitus with other skin ulcer 08/05/2017 No Yes I89.0 Lymphedema, not elsewhere classified 08/05/2017 No Yes I87.331 Chronic venous hypertension (idiopathic) with ulcer 08/05/2017 No Yes and inflammation of right lower extremity I87.332 Chronic venous hypertension (idiopathic) with ulcer 08/05/2017 No Yes and inflammation of left lower extremity L97.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 08/05/2017 No Yes leg with fat layer exposed L97.822 Non-pressure chronic ulcer of other part of left lower 08/05/2017 No Yes leg with fat layer exposed L97.522 Non-pressure chronic ulcer of other part of left foot 06/01/2019 No Yes with fat layer exposed I10 Essential (primary) hypertension 08/05/2017 No Yes E66.01 Morbid (severe) obesity due to excess calories 08/05/2017 No Yes F41.8 Other specified anxiety disorders 08/05/2017 No Yes R53.1 Weakness 08/05/2017 No Yes Inactive Problems Resolved Problems Electronic Signature(s) Signed: 08/24/2019 11:13:38 AM By: [name] Keeler PA-C Entered By: [name] on 08/24/2019 11:13:38 -------------------------------------------------------------------------------- Progress Note Details Patient  Name: Date of Service: [name]. 08/24/2019 11:00 AM Medical Record [id number] Patient Account Number: [id number] Date of Birth/Sex: Treating RN: April 22, 1951 (69 y.o. [name] Primary Care Provider: [name] Other Clinician: Referring Provider: Treating Provider/Extender:Stone III, [name], FRED Weeks in Treatment: 107 Subjective Chief Complaint Information obtained from Patient Bilateral reoccurring LE ulcers History of Present Illness (HPI) this patient has been seen a couple of times before and returns with recurrent problems to her right and left lower extremity with swelling and weeping ulcerations due to not wearing her compression stockings which she had been advised to do during her last discharge, at the end of June 2018. During her last visit the patient had had normal arterial blood flow and her venous reflux study did not necessitate any surgical intervention. She was recommended compression and elevation and wound care. After prolonged treatment the patient was completely healed but she has been noncompliant with wearing or compressions.. She was here last week with an outpatient return visit planned but the patient came in a very poor general condition with altered mental status and was rushed to the ER on my request. With a history of hypertension, diabetes, TIA and right-sided weakness she was set up for an MRI on her brain and cervical spine and was sent to [facility]. Getting an MRI done was very difficult but once the workup was done she was found not to have any spinal stenosis, epidural abscess or hematoma or discitis. This was radiculopathy to be treated as an outpatient and she was given a follow-up appointment. Today she is feeling much better alert and oriented and has come to reevaluate her bilateral lower extremity lymphedema and ulceration 03/25/2017 -- she was admitted to the hospital on 03/16/2017 and discharged on 03/18/2017  with left leg cellulitis and ulceration. She was started on vancomycin and Zosyn and x-ray showed no bony involvement. She was treated for a cellulitis with IV antibiotics changed to Rocephin and Flagyl and was discharged on oral Keflex and doxycycline to complete a 7 day course. Last hemoglobin A1c was 7.1 and her other ailments including hypertension got asthma were appropriately treated. 05/06/2017 -- she is awaiting the right size of compression stockings from [place] [facility] but other than that has been doing well. ====== Old notes 69 year old patient was seen one time last October and was lost to follow-up. She has recurrent  problems with weeping and ulceration of her left lower extremity and has swelling of this for several years. It has been worse for the last 2 months. Past medical history is significant for diabetes mellitus type 2, hypertension, gout, morbid obesity, depressive disorders, hiatal hernia, migraines, status post knee surgery, risk of a cholecystectomy, vaginal hysterectomy and breast biopsy. She is not a smoker. As noted before she has never had a venous duplex study and an arterial ABI study was attempted but the left lower extremity was noncompressible 10/01/2016 -- had a lower extremity venous duplex reflux evaluation which showed no evidence of deep vein reflux in the right or left lower extremity, and no evidence of great saphenous vein reflux more than 500 ms in the right or left lower extremity, and the left small saphenous vein is incompetent but no vascular consult was recommended. review of her electronic medical records noted that the ABI was checked in July 2017 where the right ABI was normal limits and the left ABI could not be ascertained due to pain with cuff pressure but the waveforms are within normal limits. her arterial duplex study scheduled for April 27. 10/08/2016 -- the patient has various reasons for not having a compression on and for the last 3 days  she has had no compression on her left lower extremity either due to pain or the lack of nursing help. She does not use her juxta lites either. 10/15/2016 -- the patient did not keep her appointment for arterial duplex study on April 27 and I have asked her to reschedule this. Her pain is out of proportion with the physical findings and she continuously fails to wear a compression wraps and cuts them off because she says she cannot tolerate the pain. She does not use her juxta lites either. 10/22/2016 -- he has rescheduled her arterial duplex study to May 21 and her pain today is a bit better. She has not been wearing her juxta lites on her right lower extremity but now understands that she needs to do this. She did tolerate the to press compression wrap on her left lower extremity 10/29/2016 --arterial duplex study is scheduled for next week and overall she has been tolerating her compression wraps and also using her juxta lites on her right lower extremity 11/05/2016 -- the right ABI was 0.95 the left was 1.03. The digit TBI is on the right was 0.83 on the left was 0.92 and she had biphasic flow through these vessels. The impression was that of normal lower extremity arterial study. 11/12/2016 -- her pain is minimal and she is doing very well overall. 11/26/2016 -- she has got juxta lites and her insurance will not pay for additional dual layer compression stockings. She is going to order some from [facility]. 05/12/2017 -- her juxta lites are very old and too big for her and these have not been helping with compression. She did get 20-30 mm compression stockings from [facility] but she and her husband are unable to put these on. I believe she will benefit from bilateral Extremit-ease, compression stockings and we will measure her for these today. 05/20/2017 -- lymphedema on the left lower extremity has increased a lot and she has a open ulceration as a result of this. The right lower extremity  is looking pretty good. She has decided to by the compression stockings herself and will get reimbursed by the home health, at a later date. 05/27/2017 -- her sciatica is bothering her a lot and she thought  her left leg pain was caused due to the compression wrap and hence removed it and has significant lymphedema. There is no inflammation on this left lower extremity. 06/17/17 on evaluation today patient appears to be doing very well and in fact is completely healed in regard to her ulcerations. Unfortunately however she does have continued issues with lymphedema nonetheless. We did order compression garments for her unfortunately she states that the size that she received were large although we ordered medium. Obviously this means she is not getting the optimal compression. She does not have those with her today and therefore we could not confirm and contact the company on her behalf. Nonetheless she does state that she is going to have her husband bring them by tomorrow so that we can verify and then get in touch with the company. No fevers, chills, nausea, or vomiting noted at this time. Overall patient is doing better otherwise and I'm pleased with the progress she has made. 07/01/17 on evaluation today patient appears to be doing very well in regard to her bilateral lower extremity she does not have any openings at this point which is excellent news. Overall I'm pleased with how things have progressed up to this time. Since she is doing so well we did order her compression which we are seeing her today to ensure that it fits her properly and everything is doing well in that regard and then subsequently she will be discharged. ============ Old Notes: 03/31/16 patient presents today for evaluation concerning open wounds that she has over the left medial ankle region as well as the left dorsal foot. She has previously had this occur although it has been healed for a number of months after having  this for about a year prior until her hospitalization on 01/05/16. At that point in time it appears that she was admitted to the hospital for left lower extremity cellulitis and was placed on vancomycin and Zosyn at that point. Eventually upon discharge on January 15, 2016 she was placed on doxycycline at that point in time. Later on 03/27/16 positive wound culture growing Escherichia coli this was switched to amoxicillin. Currently she tells me that she is having pain radiated to be a 7 out of 10 which can be as high as 10 out of 10 with palpation and manipulation of the wound. This wound appears to be mainly venous in nature due to the bilateral lower extremity venous stasis/lymphedema. This is definitely much worse on her left than the right side. She does have type 1 diabetes mellitus, hypertension, morbid obesity, and is wheelchair dependent.during the course of the hospital stay a blood culture was also obtained and fortunately appeared negative. She also had an x-ray of the tibia/fibula on the left which showed no acute bone abnormality. Her white blood cell count which was performed last on 03/25/16 was 7.3, hemoglobin 12.8, protein 7.1, albumin 3.0. Her urine culture appeared to be negative for any specific organisms. Patient did have a left lower extremity venous duplex evaluation for DVT. This did not include venous reflux studies but fortunately was negative for DVT. Patient also had arterial studies performed which revealed that she had a normal ABI on the right though this was unable to be performed on the left secondary to pain that she was having around the ankle region due to the wound. However it was stated on report that she had biphasic pulses and apparently good blood flow. ========== 06/03/17 she is here in follow-up evaluation for right  lower extremity ulcer. The right lower sure he has healed but she has reopened to the left medial malleolus and dorsal foot with weeping. She is  waiting for new compression garments to arrive from home health, the previous compression garments were ill fitting. We will continue with compression bilaterally and follow-up in 2 weeks Readmission: 08/05/17 on evaluation today patient appears to be doing somewhat poorly in regard to her left lower extremity especially although the right lower extremity has a small area which may no longer be open. She has been having a lot of drainage from the left lower extremity however he tells me that she has not been able to use the EXTREMIT- EASE Compression at this point. She states that she did better and was able to actually apply the Juxta-Lite compression although the wound that she has is too large and therefore really does not compress which is why she cannot wear it at this point. She has no one who can help her put it on regular basis her son can sometimes but he's not able to do it most of the time. I do believe that's why she has begun to weave and have issues as she is currently yet again. No fevers, chills, nausea, or vomiting noted at this time. Patient is no evidence of dementia. 08/12/17 on evaluation today patient appears to still be doing fairly well in regard to the draining areas/weeping areas at this point. With that being said she unfortunately did go to the ER yesterday due to what was felt to be possibly a cellulitis. They place her on doxycycline by mouth and discharge her home. She definitely was not admitted. With that being said she states she has had more discomfort which has been unusual for her even compared to prior times and she's had infections.08/12/17 on evaluation today patient appears to still be doing fairly well in regard to the draining areas/weeping areas at this point. With that being said she unfortunately did go to the ER yesterday due to what was felt to be possibly a cellulitis. They place her on doxycycline by mouth and discharge her home. She definitely was  not admitted. With that being said she states she has had more discomfort which has been unusual for her even compared to prior times and she's had infections. 08/19/17 put evaluation today patient tells me that she's been having a lot of what sounds to be neuropathic type pain in regard to her left lower extremity. She has been using over-the-counter topical bins again which some believe. That in order to apply the she actually remove the wrap we put on her last Wednesday on Thursday. Subsequently she has not had anything on compression wise since that time. The good news is a lot of the weeping areas appear to have closed at this point again I believe she would do better with compression but we are struggling to get her to actually use what she needs to at this point. No fevers, chills, nausea, or vomiting noted at this time. 09/03/17 on evaluation today patient appears to be doing okay in regard to her lower extremities in regard to the lymphedema and weeping. Fortunately she does not seem to show any signs of infection at this point she does have a little bit of weeping occurring in the right medial malleolus area. With that being said this does not appear to be too significant which is good news. 09/10/17; this is a patient with severe bilateral secondary lymphedema secondary to  chronic venous insufficiency. She has severe skin damage secondary to both of these features involving the dorsal left foot and medial left ankle and lower leg. Still has open areas in the left anterior foot. The area on the right closed over. She uses her own juxta light stockings. She does not have an arterial issue 09/16/17 on evaluation today patient actually appears to be doing excellent in regard to her bilateral lower extremity swelling. The Juxta-Lite compression wrap seem to be doing very well for her. She has not however been using the portion that goes over her foot. Her left foot still is draining a little bit  not nearly as significant as it has been in the past but still I do believe that she likely needs to utilize the full wrap including the foot portion of this will improve as well. She also has been apparently putting on a significant amount of Vaseline which also think is not helpful for her. I recommended that if she feels she needs something for moisturizer Eucerin will probably be better. 09/30/17 on evaluation today patient presents with several new open areas in regard to her left lower extremity although these appear to be minimal and mainly seem to be more moisture breakdown than anything. Fortunately she does not seem to have any evidence of infection which is great news. She has been tolerating the dressing changes without complication we are using silver alginate on the foot she has been using AB pads to have the legs and using her Juxta-Lite compression which seems to be controlling her swelling very well. Overall I'm pleased with the poor way she has progressed. 10/14/17 on evaluation today patient appears to be doing better in regard to her left lower extremity areas of weeping. She does still have some discomfort although in general this does not appear to be as macerated and I think it is progressing nicely. I do think she still needs to wear the foot portion of her Juxta-Lite in order to get the most benefit from the wrap obviously. She states she understands. Fortunately there does not appear to be evidence of infection at this time which is great news. 10/28/17 on evaluation today patient appears to be doing excellent in regard to her left lower extremity. She has just a couple areas that are still open and seem to be causing any trouble whatsoever. For that reason I think that she is definitely headed in the right direction the spots are very tiny compared to what we have been dealing with in the past. 11/11/17 on evaluation today patient appears to have a right lateral lower  extremity ulcer that has opened since I last saw her. She states this is where the home health nurse that was coming out remove the dressing without wetting the alginate first. Nonetheless I do not know if this is indeed the case or not but more importantly we have not ordered home help to be coming out for her wounds at all. I'm unsure as to why they are coming out and we're gonna have to check on this and get things situated in that regard. With that being said we currently really do not need them to be coming out as the patient has been taking care of her leg herself without complication and no issues. In fact she was doing much better prior to nursing coming out. 11/25/17 on evaluation today patient actually appears to be doing fairly well in regard to her left lower extremity swelling. In fact  she has very little area of weeping at this point there's just a small spot on the lateral portion of her right leg that still has me just a little bit more concerned as far as wanting to see this clear up before I discharge her to caring for this at home. Nonetheless overall she has made excellent progress. 12/09/17 on evaluation today patient appears to be doing rather well in regard to her lower extremity edema. She does have some weeping still in the left lower extremity although the big area we were taking care of two weeks ago actually has closed and she has another area of weeping on the left lower extremity immediately as well is the top of her foot. She does not currently have lymphedema pumps she has been wearing her compression daily on a regular basis as directed. With that being said I think she may benefit from lymphedema pumps. 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 We had a very difficult time getting and keeping this under  control. 12/23/17 on evaluation today patient actually appears to be doing a little bit more poorly in regard to her bilateral lower extremities. She has been tolerating the Juxta-Lite compression wraps. Unfortunately she has two new ulcers on the right lower extremity and left lower [place] ulceration seems to be larger. Obviously this is not good news. She has been tolerating the dressings without complication. 12/30/17 on evaluation today patient actually appears to be doing much better in regard to her bilateral lower extremity edema. She continues to have some issues with ulcerations and in fact there appears to be one spot on each leg where the wrap may have caused a little bit of a blister which is subsequently opened up at this point is given her pain. Fortunately it does not appear to be any evidence of infection which is good news. No fevers chills noted. 01/13/18 on evaluation today patient appears to be doing rather well in regard to her bilateral lower extremities. The dressings did get kind of stuck as far as the wound beds are concerned but again I think this is mainly due to the fact that she actually seems to be showing signs of healing which is good news. She's not having as much drainage therefore she was having more of the dressing sticking. Nonetheless overall I feel like her swelling is dramatically down compared to previous. 01/20/18 on evaluation today patient unfortunately though she's doing better in most regards has a large blister on the left anterior lower extremity where she is draining quite significantly. Subsequently this is going to need debridement today in order to see what's underneath and ensure she does not continue to trapping fluid at this location. Nonetheless No fevers, chills, nausea, or vomiting noted at this time. 01/27/18 on evaluation today patient appears to be doing rather well at this point in regard to her right lower extremity there's just a very  small area that she still has open at this point. With that being said I do believe that she is tolerating the compression wraps very well in making good progress. Home health is coming out at this point to see her. Her left lower extremity on the lateral portion is actually what still mainly open and causing her some discomfort for the most part 02/10/18 on evaluation today patient actually appears to  be doing very well in regard to her right lower extremity were all the ulcers appear to be completely close. In regard to the left lower extremity she does have two areas still open and some leaking from the dorsal surface of her foot but this still seems to be doing much better to me in general. 02/24/18 on evaluation today patient actually appears to be doing much better in regard to her right lower extremity this is still completely healed. Her left lower extremity is also doing much better fortunately she has no evidence of infection. The one area that is gonna require some debridement is still on the left anterior shin. Fortunately this is not hurting her as badly today. 03/10/18 on evaluation today patient appears to be doing better in some regards although she has a little bit more open area on the dorsal foot and she also has some issues on the medial portion of the left lower extremity which is actually new and somewhat deep. With that being said there fortunately does not appear to be any significant signs of infection which is good news. No fevers, chills, nausea, or vomiting noted at this time. In general her swelling seems to be doing fairly well which is good news. 03/31/18 on evaluation today patient presents for follow-up concerning her left lower extremity lymphedema. Unfortunately she has been doing a little bit more poorly since I last saw her in regard to the amount of weeping that she is experiencing. She's also having some increased pain in the anterior shin location. Unfortunately  I do not feel like the patient is making such good progress at this point a few weeks back she was definitely doing much better. 04/07/18 on evaluation today patient actually appears to be showing some signs of improvement as far as the left lower extremity is concerned. She has been tolerating the dressing changes and it does appear that the Drawtex did better for her. With that being said unfortunately home health is stating that they cannot obtain the Drawtex going forward. Nonetheless we're gonna have to check and see what they may be able to get the alginate they were using was getting stuck in causing new areas of skin being pulled all that with and subsequently weep and calls her to worsen overall this is the first time we've seen improvement at this time. 04/14/18 on evaluation today patient actually appears to be doing rather well at this point there does not appear to be any evidence of infection at this time and she is actually doing excellent in regard to the weeping in fact she almost has no openings remaining even compared to just last week this is a dramatic improvement. No fevers chills noted 04/21/18 evaluation today patient actually appears to be doing very well. She in fact is has a small area on the posterior lower extremity location and she has a small area on the dorsal surface of her foot that are still open both of which are very close to closing. We're hoping this will be close shortly. She brought her Juxta-Lite wrap with her today hoping that would be able to put her in it unfortunately I don't think were quite at that point yet but we're getting closer. 04/28/18 upon evaluation today patient actually appears to be doing excellent in regard to her left lower extremity ulcer. In fact the region on the posterior lower extremity actually is much smaller than previously noted. Overall I'm very happy with the progress she has made.  She again did bring her Juxta-Lite although  we're not quite ready for that yet. 05/11/18 upon evaluation today patient actually appears to be doing in general fairly well in regard to her left lower Trinity. The swelling is very well controlled. With that being said she has a new area on the left anterior lower extremity as well as between the first and second toes of her left foot that was not present during the last evaluation. The region of her posterior left lower extremity actually appears to be almost completely healed. To be honest I'm very pleased with the way that stands. Nonetheless I do believe that the lotion may be keeping the area to moist as far as her legs are concerned subsequently I'm gonna consider discontinuing that today. 05/26/18 on evaluation today patient appears to be doing rather well in regard to her left lower should be ulcers. In fact everything appears to be close except for a very small area on the left posterior lower extremity. Fortunately there does not appear to be any evidence of infection at this time. Overall very pleased with her progress. 06/02/18 and evaluation today patient actually appears to be doing very well in regard to her lower extremity ulcers. She has one small area that still continues to weep that I think may benefit her being able to justify lotion and user Juxta-Lite wraps versus continued to wrap her. Nonetheless I think this is something we can definitely look into at this point. 06/23/18 on evaluation today patient unfortunately has openings of her bilateral lower extremities. In general she seems to be doing much worse than when I last saw her just as far as her overall health standpoint is concerned. She states that her discomfort is mainly due to neuropathy she's not having any other issues otherwise. No fevers, chills, nausea, or vomiting noted at this time. 06/30/18 on evaluation today patient actually appears to be doing a little worse in regard to her right lower extremity her  left lower extremity of doing fairly well. Fortunately there is no sign of infection at this time. She has been tolerating the dressing changes without complication. Home health did not come out like they were supposed to for the appropriate wrap changes. They stated that they never received the orders from [place] which were fax. Nonetheless we will send a copy of the orders with the patient today as well. 07/07/18 on evaluation today patient appears to be doing much better in regard to lower extremities. She still has several openings bilaterally although since I last saw her her legs did show obvious signs of infection when she later saw her nurse. Subsequently a culture was obtained and she is been placed on Bactrim and Keflex. Fortunately things seem to be looking much better it does appear she likely had an infection. Again last week we'd even discussed it but again there really was not any obvious sign that she had infection therefore we held off on the antibiotics. Nonetheless I'm glad she's doing better today. 07/14/18 on evaluation today patient appears to be doing much better regarding her bilateral lower Trinity's. In fact on the right lower for me there's nothing open at this point there are some dry skin areas at the sites where she had infection. Fortunately there is no evidence of systemic infection which is excellent news. No fevers chills noted 07/21/18 on evaluation today patient actually appears to be doing much better in regard to her left lower extremity ulcers. She is making good  progress and overall I feel like she's improving each time I see her. She's having no pain I do feel like the infection is completely resolved which is excellent news. No fevers, chills, nausea, or vomiting noted at this time. 07/28/18 on evaluation today patient appears to be doing very well in regard to her left lower [employer]. Everything seems to be showing signs of improvement which is excellent  news. Overall very pleased with the progress that has been made. Fortunately there's no evidence of active infection at this time also excellent news. 08/04/18 on evaluation today patient appears to be doing more poorly in regard to her bilateral lower extremities. She has two new areas open up on the right and these were completely closed as of last week. She still has the two spots on the left which in my pinion seem to be doing better. Fortunately there's no evidence of infection again at this point. 08/11/18 on evaluation today patient actually appears to be doing very well in regard to her bilateral lower Trinity wounds that all seem to be doing better and are measures smaller today. Fortunately there's no signs of infection. No fevers, chills, nausea, or vomiting noted at this time. 08/18/18 on evaluation today patient actually appears to be doing about the same inverter bilateral lower extremities. She continues to have areas that blistering open as was drain that fortunately nothing too significant. Overall I feel like Drawtex may have done better for her however compared to the collagen. 08/25/18 on evaluation today patient appears to be doing a little bit more poorly today even compared to last time I saw her. Again I'm not exactly sure why she's making worse progress over the past several weeks. I'm beginning to wonder if there is some kind of underlying low level infection causing this issue. I did actually take a culture from the left anterior lower extremity but it was a new wound draining quite a bit at this point. Unfortunately she also seems to be having more pain which is what also makes me worried about the possibility of infection. This is despite never erythema noted at this point. 09/01/18 on evaluation today patient actually appears to be doing a little worse even compared to last week in regard to bilateral lower extremities. She did go to the hospital on the 16th was given a  dose of IV Zosyn and then discharged with a recommendation to continue with the Bactrim that I previously prescribed for her. Nonetheless she is still having a lot of discomfort she tells me as well at this time. This is definitely unfortunate. No fevers, chills, nausea, or vomiting noted at this time. 09/08/18 on evaluation today patient's bilateral lower extremities actually appear to be shown signs of improvement which is good news. Fortunately there does not appear to be any signs of active infection I think the anabiotic is helping in this regard. Overall I'm very pleased with how she is progressing. 09/15/18 patient was actually seen in ER yesterday due to her legs as well unfortunately. She states she's been having a lot of pain and discomfort as well as a lot of drainage. Upon inspection today the patient does have a lot of swelling and drainage I feel like this is more related to lymphedema and poor fluid control than it is to infection based on what I'm seeing. The physician in the emergency department also doubted that the patient was having a significant infection nonetheless I see no evidence of infection obvious at  this point although I do see evidence of poor fluid control. She still not using a compression pumps, she is not elevating due to her lift chair as well as her hospital bed being broken, and she really is not keeping her legs up as much as they should be and also has been taking off her wraps. All this combined I think has led to poor fluid control and to be honest she may be somewhat volume overloaded in general as well. I recommend that she may need to contact your physician to see if a prescription for a diuretic would be beneficial in their opinion. As long as this is safe I think it would likely help her. 09/29/18 on evaluation today patient's left lower extremity actually appears to be doing quite a bit better. At least compared to last time that I saw her. She still has  a large area where she is draining from but there's a lot of new skin speckled trout and in fact there's more new skin that there are open areas of weeping and drainage at this point. This is good news. With regard to the right lower extremity this is doing much better with the only open area that I really see being a dry spot on the right lateral ankle currently. Fortunately there's no signs of active infection at this time which is good news. No fevers, chills, nausea, or vomiting noted at this time. The patient seems somewhat stressed and overwhelmed during the visit today she was very lethargic as such. She does and she is not taking any pain medications at this point. Apparently according to her husband are also in the process of moving which is probably taking its toll on her as well. 10/06/18 on evaluation today patient appears to be doing rather well in regard to her lower extremities compared to last evaluation. Fortunately there's no signs of active infection. She tells me she did have an appointment with her primary. Nonetheless he was concerned that the wounds were somewhat deep based on pictures but we never actually saw her legs. She states that he had her somewhat worried due to the fact that she was fearing now that she was [place] have to have an amputation. With that being said based on what I'm seeing check she looks better this week that she has the last two times I've seen her with much less drainage I'm actually pleased in this regard. That doesn't mean that she's out of the water but again I do not think what the point of talking about education at all in regard to her leg. She is very happy to hear this. She is also not having as much pain as she was having last week. 10/13/18 unfortunately on evaluation today patient still continues to have a significant amount of drainage she's not letting home health actually apply the compression dressings at this point. She's trying to use  of Juxta-Lite of the top of Kerlex and the second layer of the three layer compression wrap. With that being said she just does not seem to be making as good a progress as I would expect if she was having the compression applied and in place on a regular basis. No fevers, chills, nausea, or vomiting noted at this time. 10/20/18 on evaluation today patient appears to be doing a little better in regard to her bilateral lower extremity ulcers. In fact the right lower extremity seems to be healed she doesn't even have any openings at this  point left lower extremity though still somewhat macerated seems to be showing signs of new skin growth at multiple locations throughout. Fortunately there's no evidence of active infection at this time. No fevers, chills, nausea, or vomiting noted at this time. 10/27/18 on evaluation today patient appears to be doing much better in regard to her left lower Trinity ulcer. She's been tolerating the laptop complication and has minimal drainage noted at this point. Fortunately there's no signs of active infection at this time. No fevers, chills, nausea, or vomiting noted at this time. 11/03/18 on evaluation today patient actually appears to be doing excellent in regard to her left lower extremity. She is having very little drainage at this point there does not appear to be any significant signs of infection overall very pleased with how things have gone. She is likewise extremely pleased still and seems to be making wonderful progress week to week. I do believe antibiotics were helpful for her. Her primary care provider did place on amateur clean since I last saw her. 11/17/18 on evaluation today patient appears to be doing worse in regard to her bilateral lower extremities at this point. She is been tolerating the dressing changes without complication. With that being said she typically takes the Coban off fairly quickly upon arriving home even after being seen here in the  clinic and does not allow home health reapply command as part of the dressing at home. Therefore she said no compression essentially since I last saw her as best I can tell. With that being said I think it shows and how much swelling she has in the open wounds that are noted at this point. Fortunately there's no signs of infection but unfortunately if she doesn't get this under control I think she will end up with infection and more significant issues. 11/24/18 on evaluation today patient actually appears to be doing somewhat better in regard to her bilateral lower extremities. 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 Nonetheless that is doing better currently so she should be able to attempt reinitiating the lymphedema pumps at this time. No fevers, chills, nausea, or vomiting noted at this time. 12/01/18 upon evaluation today patient's left lower extremity appears to be doing quite well unfortunately her right lower extremity is not doing nearly as well. She has been tolerating the dressing changes without complication unfortunately she did not keep a wrap on the right at this time. Nonetheless I believe this has led to increased swelling and weeping in the world is actually much larger than during the last evaluation with her. 12/08/18 on evaluation today patient appears to be doing about the same at this point in regard to her right lower extremity. There is some more palatable to touch I'm concerned about the possibility of there being some infection although I think the main issue is she's not keeping her compression wrap on which in turn is not allowing this area to heal appropriately. 12/22/18 on evaluation today patient appears to be doing better in regard to left lower extremity unfortunately significantly worse in regard to the right lower  extremity. The areas of blistering and necrotic superficial tissue have spread and again this does not really appear to be signs of infection and all she just doesn't seem to be doing nearly as well is what  she has been in the past. Overall I feel like the Augmentin did absolutely nothing for her she doesn't seem to have any infection again I really didn't think so last time either is more of a potential preventative measure and hoping that this would make some difference but I think the main issue is she's not wearing her compression. She tells me she cannot wear the [place] been we put on she takes it off pretty much upon getting home. Subsequently she worshiped Juxta-Lite when I questioned her about how often she wears it this is no more than three hours a day obviously that leaves 21 hours that she has no compression and this is obviously not doing well for her. Overall I'm concerned that if things continue to worsen she is at great risk of both infection as well as losing her leg. 01/05/19 on evaluation today patient appears to be doing well in regard to her left lower extremity which he is allowing [place] to wrap and not so well with regard to her right lower extremity which she is not allowing [place] to really wrap and keep the wrap on. She states that it hurts too badly whenever it's wrapped and she ends up having to take it off. She's been using the Juxta-Lite she tells me up to six hours a day although I question whether or not that's really been the case to be honest. Previously she told me three hours today nonetheless obviously the legs as long as the wrap is doing great when she is not is doing much more poorly. 01/12/2019 on evaluation today patient actually appears to be doing a little better in my opinion with regard to her right lower extremity ulcer. She has a small open area on the left lower extremity unfortunately but again this I think is part of the normal fluctuation of what she is  going to have to expect with regard to her legs especially when she is not using her lymphedema pumps on a regular basis. Subsequently based on what I am seeing today I think that she does seem to be doing slightly better with regard to her right lower extremity she did see her primary care provider on Monday they felt she had an infection and placed her on 2 antibiotics. Both Cipro and clindamycin. Subsequently again she seems possibly to be doing a little bit better in regards to the right lower extremity she also tells me however she has been wearing the compression wrap over the past week since I spoke with her as well that is a Kerlix and Coban wrap on the right. No fevers, chills, nausea, vomiting, or diarrhea. 01/19/2019 on evaluation today patient appears to be doing better with regard to her bilateral lower extremities especially the right. I feel like the compression has been beneficial for her which is great news. She did get a call from her primary care provider on her way here today telling her that she did have methicillin-resistant Staphylococcus aureus and he was calling in a couple new antibiotics for her including a ointment to be applied she tells me 3 times a day. With that being said this sounds like likely to be Bactroban which I think could be applied with each dressing/wrap change but I would not be able to accommodate her applying this 3 times a day. She is in agreement with the least doing this we will add that to her orders today. 01/26/2019 on evaluation today patient actually appears to be doing  much better with regard to her right lower extremity. Her left lower extremity is also doing quite well all things considering. Fortunately there is no evidence of active infection at this time. No fevers, chills, nausea, vomiting, or diarrhea. 02/02/2019 on evaluation today patient appears to be doing much better compared to her last evaluation. Little by little off like her right  leg is returning more towards normal. There does not appear to be any signs of active infection and overall she seems to be doing quite well which is great news. I am very pleased in this regard. No fevers, chills, nausea, vomiting, or diarrhea. 02/09/2019 upon evaluation today patient appears to be doing better with regard to her bilateral lower extremities. She has been tolerating the dressing changes without complication. Fortunately there is no signs of active infection at this time. No fevers, chills, nausea, vomiting, or diarrhea. 02/23/2019 on evaluation today patient actually appears to be doing quite well with regard to her bilateral lower extremities. She has been tolerating the dressing changes without complication. She is even used her pumps one time and states that she really felt like it felt good. With that being said she seems to be in good spirits and her legs appear to be doing excellent. 03/09/2019 on evaluation today patient appears to be doing well with regard to her right lower extremity there are no open wounds at this time she is having some discomfort but I feel like this is more neuropathy than anything. With regard to her left lower extremity she had several areas scattered around that she does have some weeping and drainage from but again overall she does not appear to be having any significant issues and no evidence of infection at this time which is good news. 03/23/2019 on evaluation today patient appears to be doing well with regard to her right lower extremity which she tells me is still close she is using her juxta light here. Her left lower extremity she mainly just has an area on the foot which is still slightly draining although this also is doing great. Overall very pleased at this time. 04/06/2019 patient appears to be doing a little bit worse in regard to her left lower extremity upon evaluation today. She feels like this could be becoming infected again which  she had issues with previous. Fortunately there is no signs of systemic infection but again this is always a struggle with her with her legs she will go from doing well to not so well in a very short amount of time. 04/20/2019 on evaluation today patient actually appears to be doing quite well with regard to her right lower extremity I do not see any signs of active infection at this time. Fortunately there is no fever chills noted. She is still taking the antibiotics which I prescribed for her at this point. In regard to the left lower extremity I do feel like some of these areas are better although again she still is having weeping from several locations at this time. 04/27/2019 on evaluation today patient appears to be doing about the same if not slightly worse in regard to her left lower extremity ulcers. She tells me when questioned that she has been sleeping in her Hoveround chair in fact she tells me she falls asleep without even knowing it. I think she is spending a whole lot of time in the chair and less time walking and moving around which is not good for her legs either. On top of  that she is in a seated position which is also the worst position she is not really elevating her legs and she is also not using her lymphedema pumps. All this is good to contribute to worsening of her condition in general. 05/18/2019 on evaluation today patient appears to be doing well with regard to her lower extremity on the right in fact this is showing no signs of any open wounds at this time. On the left she is continuing to have issues with areas that do drain. Some of the regions have healed and there are couple areas that have reopened. She did go to the ER per the patient according to recommendations from the home health nurse due to what she was seen when she came out on 05/13/2019. Subsequently she felt like the patient needed to go to the hospital due to the fact that again she was having "milky  white discharge" from her leg. Nonetheless she had and then was placed on doxycycline and subsequently seems to be doing better. 06/01/2019 upon evaluation today patient appears to be doing really in my opinion about the same. I do not see any signs of active infection which is good news. Overall she still has wounds over the bilateral lower extremities she has reopened on the right but this appears to be more of a crack where there is weeping/edema coming from the region. I do not see any evidence of infection at either site based on what I visualized today. 07/13/2019 upon evaluation today patient appears to be doing a little worse compared to last time I saw her. She since has been in the hospital from 06/21/2019 through 06/29/2019. This was secondary to having Covid. During that time they did apply lotion to her legs which unfortunately has caused her to develop a myriad of open wounds on her lower extremities. Her legs do appear to be doing better as far as the overall appearance is concerned but nonetheless she does have more open and weeping areas. 07/27/2019 upon evaluation today patient appears to be doing more poorly to be honest in regard to her left lower extremity in particular. There is no signs of systemic infection although I do believe she may have local infection. She notes she has been having a lot of blue/green drainage which is consistent potentially with Pseudomonas. That may be something that we need to consider here as well. The doxycycline does not seem to have been helping. 08/03/2019 upon evaluation today patient appears to be doing a little better in my opinion compared to last week's evaluation. Her culture I did review today and she is on appropriate medications to help treat the Enterobacter that was noted. Overall I feel like that is good news. With that being said she is unfortunately continuing to have a lot of drainage and though it is doing better I still think she  has a long ways to go to get things dried up in general. Fortunately there is no signs of systemic infection. 08/10/2019 upon evaluation today patient appears to be doing may be slightly better in regard to her left lower extremity the right lower extremity is doing much better. Fortunately there is no signs of infection right now which is good news. No fevers, chills, nausea, vomiting, or diarrhea. 08/24/2019 on evaluation today patient appears to be doing slightly better in regard to her lower extremities. The left lower extremity seems to be healed the right lower extremity is doing better though not completely healed as far  as the openings are concerned. She has some generalized issues here with edema and weeping secondary to her lymphedema though again I do believe this is little bit drier compared to prior weeks evaluations. In general I am very pleased with how things seem to be progressing. No fevers, chills, nausea, vomiting, or diarrhea. Objective Constitutional Well-nourished and well-hydrated in no acute distress. Vitals Time Taken: 11:16 AM, Height: 62 in, Weight: 335 lbs, BMI: 61.3, Temperature: 98.7 F, Pulse: 99 bpm, Respiratory Rate: 18 breaths/min, Blood Pressure: 140/73 mmHg. Respiratory normal breathing without difficulty. Psychiatric this patient is able to make decisions and demonstrates good insight into disease process. Alert and Oriented x 3. pleasant and cooperative. General Notes: Patient's wound bed currently showed signs of good granulation at this time. Fortunately there is no evidence of active infection which is great news. I do believe she is making progress which is great news. Overall I am very pleased with the appearance today. Integumentary (Hair, Skin) Wound #61 status is Open. Original cause of wound was Gradually Appeared. The wound is located on the Left,Circumferential Lower Leg. The wound measures 20cm length x 30cm width x 0.1cm depth; 471.239cm^2  area and 47.124cm^3 volume. There is Fat Layer (Subcutaneous Tissue) Exposed exposed. There is no tunneling or undermining noted. There is a large amount of serous drainage noted. The wound margin is flat and intact. There is no granulation within the wound bed. There is a large (67-100%) amount of necrotic tissue within the wound bed including Adherent Slough. Wound #63 status is Healed - Epithelialized. Original cause of wound was Gradually Appeared. The wound is located on the Right,Medial Malleolus. The wound measures 0cm length x 0cm width x 0cm depth; 0cm^2 area and 0cm^3 volume. Wound #64 status is Open. Original cause of wound was Gradually Appeared. The wound is located on the Left,Dorsal Foot. The wound measures 8cm length x 8.5cm width x 0.1cm depth; 53.407cm^2 area and 5.341cm^3 volume. There is Fat Layer (Subcutaneous Tissue) Exposed exposed. There is no tunneling or undermining noted. There is a small amount of serous drainage noted. The wound margin is flat and intact. There is no granulation within the wound bed. There is a large (67-100%) amount of necrotic tissue within the wound bed including Adherent Slough. 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 There was a Three Layer Compression Therapy Procedure by Carlene Coria, RN. Post procedure Diagnosis Wound #61: Same as Pre-Procedure There was a Three Layer Compression Therapy Procedure by Carlene Coria, RN. Post procedure Diagnosis Wound #: Same as Pre-Procedure Plan Follow-up Appointments: Return Appointment in 1 week. Dressing Change Frequency: Change dressing three times week. Other: - may remove wrap from right leg and wear juxtalite Skin Barriers/Peri-Wound Care: Barrier cream - zinc oxide cream to any macerated areas Wound Cleansing: Clean wound with Wound Cleanser - all wounds May shower with protection.  Primary Wound Dressing: Wound #61 Left,Circumferential Lower Leg: Calcium Alginate with Silver Wound #64 Left,Dorsal Foot: Calcium Alginate with Silver Secondary Dressing: Wound #61 Left,Circumferential Lower Leg: Dry Gauze - all wounds ABD pad - as needed Zetuvit or Kerramax - as needed Wound #64 Left,Dorsal Foot: Dry Gauze - all wounds ABD pad - as needed Zetuvit or Kerramax - as needed Edema Control: 3 Layer Compression System - Bilateral - in clinic, home health to reapply left leg wrap only Avoid standing for long periods of time - walking is encouraged Elevate legs to the level of the heart or above for 30 minutes daily and/or when sitting, a frequency of: - do not sleep in chair with feet dangling, MUST elevate legs while sitting Exercise regularly Support Garment 20-30 mm/Hg pressure to: - Juxtalite to right lower leg once wrap removed Segmental Compressive Device. - lymphedema pumps 60 minutes 1- 2 times per day Off-Loading: Turn and reposition every 2 hours Additional Orders / Instructions: Follow Nutritious Diet - To include vitamin A, vitamin C, and Zinc along with increased protein intake. Home Health: [facility]. - [facility]. My suggestion at this time is good to be that we  going continue with the current wound care measures using the zinc oxide over the lower extremity which I think is helpful for the patient as far as helping protect any good skin from breakdown from the open and weeping areas. 2. I am also going to recommend that we continue with the compression. I do feel like the compression has been helpful for her and I would recommend she continue as such. We will see patient back for reevaluation in 1 week here in the clinic. If anything worsens or changes patient will contact our office for additional recommendations. Electronic Signature(s) Signed: 08/24/2019 1:01:29 PM By: [name] Keeler PA-C Entered By: [name] on 08/24/2019 13:01:29 -------------------------------------------------------------------------------- SuperBill Details Patient Name: Date of Service: [name] 08/24/2019 Medical Record [id number] Patient Account Number: [id number] Date of Birth/Sex: Treating RN: [birth year] (68 y.o. [name] Primary Care Provider: [name] Other Clinician: Referring Provider: Treating Provider/Extender:Stone III, [name], FRED Weeks in Treatment: 107 Diagnosis Coding ICD-10 Codes Code Description E11.622 Type 2 diabetes mellitus with other skin ulcer I89.0 Lymphedema, not elsewhere classified I87.331 Chronic venous hypertension (idiopathic) with ulcer and inflammation of right lower extremity I87.332 Chronic venous hypertension (idiopathic) with ulcer and inflammation of left lower extremity L97.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 leg with fat layer exposed L97.822 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 I10 Essential (primary) hypertension E66.01 Morbid (severe) obesity due to excess calories F41.8 Other specified anxiety disorders R53.1 Weakness Facility  Procedures The patient participates with Medicare or their insurance follows the Medicare Facility Guidelines: CPT4 Description Modifier Quantity Code [id number] BILATERAL: Application of multi-layer venous compression system; leg 1 (below knee), including ankle and foot. Physician Procedures CPT4 Code Description: 5956387 [id number] - WC PHYS LEVEL 3 - EST PT ICD-10 Diagnosis Description E11.622 Type 2 diabetes mellitus with other skin ulcer I89.0 Lymphedema, not elsewhere classified I87.331 Chronic venous hypertension (idiopathic) with ulcer and  i lower extremity I87.332 Chronic venous hypertension (idiopathic) with ulcer and infl extremity Modifier: nflammation of r ammation of left Quantity: 1 ight lower Electronic Signature(s) Signed: 08/24/2019 1:01:46 PM By: [name] Keeler PA-C Entered  By: [name] on 08/24/2019 13:01:43

## 2019-08-24 NOTE — Telephone Encounter (Signed)

## 2019-08-25 ENCOUNTER — Ambulatory Visit (HOSPITAL_COMMUNITY)
Admission: RE | Admit: 2019-08-25 | Discharge: 2019-08-25 | Disposition: A | Discharge status: Home / Self Care | Payer: Medicare PPO | Source: Ambulatory Visit | Attending: Vascular Surgery | Admitting: Vascular Surgery

## 2019-08-25 ENCOUNTER — Encounter: Payer: Self-pay | Admitting: Vascular Surgery

## 2019-08-25 ENCOUNTER — Ambulatory Visit: Payer: Medicare PPO | Admitting: Vascular Surgery

## 2019-08-25 ENCOUNTER — Other Ambulatory Visit: Payer: Self-pay

## 2019-08-25 VITALS — BP 111/65 | HR 91 | Temp 97.9°F | Resp 18 | Ht 65.0 in | Wt 312.0 lb

## 2019-08-25 DIAGNOSIS — L97929 Non-pressure chronic ulcer of unspecified part of left lower leg with unspecified severity: Secondary | ICD-10-CM

## 2019-08-25 DIAGNOSIS — L97919 Non-pressure chronic ulcer of unspecified part of right lower leg with unspecified severity: Secondary | ICD-10-CM | POA: Diagnosis not present

## 2019-08-25 DIAGNOSIS — I83029 Varicose veins of left lower extremity with ulcer of unspecified site: Secondary | ICD-10-CM

## 2019-08-25 DIAGNOSIS — R29898 Other symptoms and signs involving the musculoskeletal system: Secondary | ICD-10-CM

## 2019-08-25 DIAGNOSIS — I872 Venous insufficiency (chronic) (peripheral): Secondary | ICD-10-CM

## 2019-08-25 DIAGNOSIS — I83019 Varicose veins of right lower extremity with ulcer of unspecified site: Secondary | ICD-10-CM | POA: Diagnosis not present

## 2019-08-25 NOTE — Progress Notes (Signed)
REASON FOR CONSULT:    "Recanalized thrombus in left common femoral vein."  The consult is requested by Dr. Kalman Shan  ASSESSMENT & PLAN:   CHRONIC VENOUS INSUFFICIENCY: This patient has CEAP C6 venous disease with chronic venous stasis ulcers bilaterally.  The wounds on her right leg have healed but she has persistent wounds on the left.  She does tell me that these are gradually getting better.  I have discussed with her the importance of intermittent leg elevation and the proper positioning for this.  In addition she will continue with aggressive wound care and compression therapy by the wound care center.  I have encouraged her to avoid prolonged sitting and standing.  She cannot really exercise nor she a candidate for water aerobics given the open wounds.  We have also discussed the importance of weight management.  I explained that central obesity especially increases lower extremity venous pressure.  I plan on seeing her back in 3 months.  If her wounds worsen I think she might be a candidate for laser ablation of the left great saphenous vein in the proximal calf and also the small saphenous vein in the calf.  The vein is under significant pressure and is a large and I think there is some risk of laser not being successful.  In addition there is small risk of DVT associated with the procedure and that the last thing she needs so I would only favor laser ablation if the wounds do not continue to improve.  Based on her previous arterial studies in July of last year she has normal arterial flow although on exam I could not assess this because of her thickened skin and dressings.  Currently I do not think repeat arterial studies are necessary but we will have to consider this in the future if the wounds do not continue to improve.   Deitra Mayo, MD Office: [phone]   HPI:   [name] is a pleasant 69 y.o. female, who was referred from the wound care center with venous  stasis ulcers.  She said extensive ulcers on both legs for years and tells me that the wounds on the right have now healed.  On the left side she has extensive wounds which she states are "finally beginning to heal.".  She has been followed at the wound care center for over a year.  They have been doing compression therapy and aggressive wound care.  She is unaware of any previous history of DVT.  Her activity is very limited and she is essentially limited to the wheelchair because of her obesity.  I do not get any history of claudication or rest pain.  Her risk factors for peripheral vascular disease include type 2 diabetes and hypertension.  She denies any history of hypercholesterolemia, family history of premature cardiovascular disease, or tobacco use.    Past Medical History:  Diagnosis Date  . Anxiety   . Arthritis    "back, arms, legs" (03/24/2016)  . Asthma   . Chronic lower back pain   . Colonic polyp    last colonoscopy done in 2009 with normal results per medical record  . Depressive disorder   . Gastric polyp   . Gout    has taken allopurinol 382m once daily in past  . Headache   . History of hiatal hernia   . Hypertension   . Migraine    "none in awhile; might have a couple/year" (03/24/2016)  . Mixed hyperlipidemia  01/2016 Total chol 141, HDL 59, LDL 63, ration 1.1  . Osteoarthritis   . TIA (transient ischemic attack) 11/2014  . Type II diabetes mellitus (HCC)     Family History  Problem Relation Age of Onset  . Stroke Father   . Stroke Brother   . Cancer Other     SOCIAL HISTORY: Social History   Socioeconomic History  . Marital status: Married    Spouse name: Not on file  . Number of children: Not on file  . Years of education: Not on file  . Highest education level: Not on file  Occupational History  . Not on file  Tobacco Use  . Smoking status: Never Smoker  . Smokeless tobacco: Never Used  Substance and Sexual Activity  . Alcohol use: No   . Drug use: No  . Sexual activity: Never  Other Topics Concern  . Not on file  Social History Narrative   Recently moved from [place] Strain:   . Difficulty of Paying Living Expenses:   Food Insecurity:   . Worried About Charity fundraiser in the Last Year:   . Arboriculturist in the Last Year:   Transportation Needs:   . Film/video editor (Medical):   Marland Kitchen Lack of Transportation (Non-Medical):   Physical Activity:   . Days of Exercise per Week:   . Minutes of Exercise per Session:   Stress:   . Feeling of Stress :   Social Connections:   . Frequency of Communication with Friends and Family:   . Frequency of Social Gatherings with Friends and Family:   . Attends Religious Services:   . Active Member of Clubs or Organizations:   . Attends Archivist Meetings:   Marland Kitchen Marital Status:   Intimate Partner Violence:   . Fear of Current or Ex-Partner:   . Emotionally Abused:   Marland Kitchen Physically Abused:   . Sexually Abused:     Allergies  Allergen Reactions  . Ace Inhibitors Swelling    SWELLING REACTION UNSPECIFIED   . Tizanidine   . Metformin And Related Other (See Comments)    CHILLS  . Propofol Itching    Current Outpatient Medications  Medication Sig Dispense Refill  . ACCU-CHEK FASTCLIX LANCETS MISC 1 each as needed.    Marland Kitchen ACCU-CHEK SMARTVIEW test strip 1 each 4 (four) times daily. for testing  0  . acetaminophen (TYLENOL) 500 MG tablet Take 500 mg by mouth daily as needed for moderate pain.    Marland Kitchen albuterol (PROVENTIL HFA;VENTOLIN HFA) 108 (90 Base) MCG/ACT inhaler Inhale 2 puffs into the lungs every 4 (four) hours as needed for wheezing or shortness of breath.     . allopurinol (ZYLOPRIM) 300 MG tablet Take 300 mg by mouth daily.    Marland Kitchen aspirin 325 MG tablet Take 325 mg by mouth daily.    Marland Kitchen atenolol (TENORMIN) 25 MG tablet Take 25-50 mg by mouth 2 (two) times daily. Take 50 mg every morning and then 25  mg every evening    . Blood Glucose Monitoring Suppl (ACCU-CHEK NANO SMARTVIEW) w/Device KIT 1 each See admin instructions.  0  . cyclobenzaprine (FLEXERIL) 10 MG tablet Take 10 mg by mouth at bedtime as needed for muscle spasms.     . diclofenac sodium (VOLTAREN) 1 % GEL Apply 4 g topically 4 (four) times daily. 1 Tube 1  . furosemide (LASIX) 20 MG tablet Take  1 tablet (20 mg total) by mouth daily. 30 tablet 0  . gabapentin (NEURONTIN) 300 MG capsule Take 1 capsule (300 mg total) by mouth 2 (two) times daily. (Patient taking differently: Take 600 mg by mouth 2 (two) times daily. ) 60 capsule 2  . hydrocerin (EUCERIN) CREA Apply 1 application topically daily. 228 g 0  . insulin degludec (TRESIBA FLEXTOUCH) 100 UNIT/ML SOPN FlexTouch Pen Inject 0.75 mLs (75 Units total) into the skin daily. (Patient taking differently: Inject 75 Units into the skin every morning. ) 3 mL 1  . NIFEdipine (PROCARDIA XL/ADALAT-CC) 90 MG 24 hr tablet Take 90 mg by mouth daily.    Marland Kitchen NOVOFINE 32G X 6 MM MISC 1 each as needed.    Marland Kitchen NOVOLOG FLEXPEN 100 UNIT/ML FlexPen Take 17 Units by mouth 2 (two) times daily.  1  . polyethylene glycol (MIRALAX / GLYCOLAX) packet Take 17 g by mouth daily as needed for moderate constipation.    . potassium chloride (K-DUR) 10 MEQ tablet Take 1 tablet (10 mEq total) by mouth daily. 30 tablet 0  . protein supplement shake (PREMIER PROTEIN) LIQD Take 325 mLs (11 oz total) by mouth daily.  0  . Cholecalciferol (VITAMIN D3) 2000 units capsule Take 2,000 Units by mouth daily.    . Diclofenac Sodium CR (VOLTAREN-XR) 100 MG 24 hr tablet Take 1 tablet (100 mg total) by mouth daily. (Patient not taking: Reported on 08/25/2019) 10 tablet 0  . magnesium gluconate (MAGONATE) 500 MG tablet Take 500 mg by mouth daily.     No current facility-administered medications for this visit.    REVIEW OF SYSTEMS:  '[X]'$  denotes positive finding, '[ ]'$  denotes negative finding Cardiac  Comments:  Chest pain or chest  pressure:    Shortness of breath upon exertion:    Short of breath when lying flat:    Irregular heart rhythm:        Vascular    Pain in calf, thigh, or hip brought on by ambulation:    Pain in feet at night that wakes you up from your sleep:     Blood clot in your veins:    Leg swelling:         Pulmonary    Oxygen at home:    Productive cough:     Wheezing:         Neurologic    Sudden weakness in arms or legs:     Sudden numbness in arms or legs:     Sudden onset of difficulty speaking or slurred speech:    Temporary loss of vision in one eye:     Problems with dizziness:         Gastrointestinal    Blood in stool:     Vomited blood:         Genitourinary    Burning when urinating:     Blood in urine:        Psychiatric    Major depression:         Hematologic    Bleeding problems:    Problems with blood clotting too easily:        Skin    Rashes or ulcers:        Constitutional    Fever or chills:     PHYSICAL EXAM:   Vitals:   08/25/19 1205  BP: 111/65  Pulse: 91  Resp: 18  Temp: 97.9 F (36.6 C)  TempSrc: Temporal  SpO2: 98%  Weight: Marland Kitchen)  312 lb (141.5 kg)  Height: 5' 5"  (1.651 m)   Body mass index is 51.92 kg/m.  GENERAL: The patient is a well-nourished female, in no acute distress. The vital signs are documented above. CARDIAC: There is a regular rate and rhythm.  VASCULAR: I do not detect carotid bruits. I was able to palpate femoral pulses. Because of the dressing on the right leg I could not assess her pulses or Doppler signals in the right foot. On the left side I was able to obtain a dorsalis pedis signal distally which was fairly brisk.  The skin overlying the posterior tibial artery was too thick to assess with the Doppler.  She has had previous noninvasive studies as documented below. I looked at her great saphenous vein in the calf myself with the SonoSite.  This vein is enlarged and under significant pressure.  The small saphenous  vein is also enlarged with reflux. She has extensive venous ulcers as documented in the photographs below.   These wounds extend to her posterior calf. PULMONARY: There is good air exchange bilaterally without wheezing or rales. ABDOMEN: Soft and non-tender with normal pitched bowel sounds.  MUSCULOSKELETAL: There are no major deformities or cyanosis. NEUROLOGIC: No focal weakness or paresthesias are detected. SKIN: There are no ulcers or rashes noted. PSYCHIATRIC: The patient has a normal affect.  DATA:    VENOUS DUPLEX: I have independently interpreted her venous duplex scan today.  On the left side there is no evidence of DVT or superficial venous thrombosis.  There is no deep venous reflux.  There is no reflux in the great saphenous vein in the thigh however there is significant reflux in the great saphenous vein in the calf which is being fed by an incompetent perforator.  There is also reflux in the small saphenous vein.  ARTERIAL DOPPLER STUDY: I reviewed the arterial Doppler study that was done in July 2020.  On the right side there was a triphasic dorsalis pedis and posterior tibial signal.  ABI was 100%.  Toe pressure was 121 mmHg.  On the left side there was a triphasic dorsalis pedis and posterior tibial signal.  ABI was 99%.  Toe pressure was 124 mmHg.

## 2019-08-30 ENCOUNTER — Ambulatory Visit
Admission: RE | Admit: 2019-08-30 | Discharge: 2019-08-30 | Disposition: A | Discharge status: Home / Self Care | Payer: Medicare PPO | Source: Ambulatory Visit | Attending: Family | Admitting: Family

## 2019-08-30 ENCOUNTER — Ambulatory Visit: Payer: Medicare PPO

## 2019-08-30 ENCOUNTER — Other Ambulatory Visit: Payer: Self-pay

## 2019-08-30 DIAGNOSIS — R921 Mammographic calcification found on diagnostic imaging of breast: Secondary | ICD-10-CM

## 2019-08-31 ENCOUNTER — Other Ambulatory Visit: Payer: Self-pay

## 2019-08-31 ENCOUNTER — Encounter (HOSPITAL_BASED_OUTPATIENT_CLINIC_OR_DEPARTMENT_OTHER): Payer: Medicare PPO | Admitting: Physician Assistant

## 2019-08-31 DIAGNOSIS — E11622 Type 2 diabetes mellitus with other skin ulcer: Secondary | ICD-10-CM | POA: Diagnosis not present

## 2019-08-31 NOTE — Progress Notes (Addendum)
RIAH, KEHOE ([id number]) Visit Report for 08/31/2019 Chief Complaint Document Details Patient Name: Date of Service: [name] 08/31/2019 11:00 AM Medical Record [id number] Patient Account Number: [id number] Date of Birth/Sex: Treating RN: Feb 23, 1951 (69 y.o. [name] Primary Care Provider: [name] Other Clinician: Referring Provider: Treating Provider/Extender:Stone III, [name], FRED Weeks in Treatment: 108 Information Obtained from: Patient Chief Complaint Bilateral reoccurring LE ulcers Electronic Signature(s) Signed: 08/31/2019 11:46:38 AM By: [name] PA-C Entered By: [name] on 08/31/2019 11:46:38 -------------------------------------------------------------------------------- HPI Details Patient Name: Date of Service: [name]. 08/31/2019 11:00 AM Medical Record [id number] Patient Account Number: [id number] Date of Birth/Sex: Treating RN: [birth year] (69 y.o. [name] Primary Care Provider: [name] Other Clinician: Referring Provider: Treating Provider/Extender:Stone III, [name], FRED Weeks in Treatment: 108 History of Present Illness HPI Description: this patient has been seen a couple of times before and returns with recurrent problems to her right and left lower extremity with swelling and weeping ulcerations due to not wearing her compression stockings which she had been advised to do during her last discharge, at the end of June 2018. During her last visit the patient had had normal arterial blood flow and her venous reflux study did not necessitate any surgical intervention. She was recommended compression and elevation and wound care. After prolonged treatment the patient was completely healed but she has been noncompliant with wearing or compressions.. She was here last week with an outpatient return visit planned but the patient came in a very poor general condition with altered  mental status and was rushed to the ER on my request. With a history of hypertension, diabetes, TIA and right-sided weakness she was set up for an MRI on her brain and cervical spine and was sent to [facility]. Getting an MRI done was very difficult but once the workup was done she was found not to have any spinal stenosis, epidural abscess or hematoma or discitis. This was radiculopathy to be treated as an outpatient and she was given a follow-up appointment. Today she is feeling much better alert and oriented and has come to reevaluate her bilateral lower extremity lymphedema and ulceration 03/25/2017 -- she was admitted to the hospital on 03/16/2017 and discharged on 03/18/2017 with left leg cellulitis and ulceration. She was started on vancomycin and Zosyn and x-ray showed no bony involvement. She was treated for a cellulitis with IV antibiotics changed to Rocephin and Flagyl and was discharged on oral Keflex and doxycycline to complete a 7 day course. Last hemoglobin A1c was 7.1 and her other ailments including hypertension got asthma were appropriately treated. 05/06/2017 -- she is awaiting the right size of compression stockings from [place] but other than that has been doing well. ====== Old notes 69 year old patient was seen one time last October and was lost to follow-up. She has recurrent problems with weeping and ulceration of her left lower extremity and has swelling of this for several years. It has been worse for the last 2 months. Past medical history is significant for diabetes mellitus type 2, hypertension, gout, morbid obesity, depressive disorders, hiatal hernia, migraines, status post knee surgery, risk of a cholecystectomy, vaginal hysterectomy and breast biopsy. She is not a smoker. As noted before she has never had a venous duplex study and an arterial ABI study was attempted but the left lower extremity was noncompressible 10/01/2016 -- had a lower extremity venous  duplex reflux evaluation which showed no evidence of deep vein reflux  in the right or left lower extremity, and no evidence of great saphenous vein reflux more than 500 ms in the right or left lower extremity, and the left small saphenous vein is incompetent but no vascular consult was recommended. review of her electronic medical records noted that the ABI was checked in July 2017 where the right ABI was normal limits and the left ABI could not be ascertained due to pain with cuff pressure but the waveforms are within normal limits. her arterial duplex study scheduled for April 27. 10/08/2016 -- the patient has various reasons for not having a compression on and for the last 3 days she has had no compression on her left lower extremity either due to pain or the lack of nursing help. She does not use her juxta lites either. 10/15/2016 -- the patient did not keep her appointment for arterial duplex study on April 27 and I have asked her to reschedule this. Her pain is out of proportion with the physical findings and she continuously fails to wear a compression wraps and cuts them off because she says she cannot tolerate the pain. She does not use her juxta lites either. 10/22/2016 -- he has rescheduled her arterial duplex study to May 21 and her pain today is a bit better. She has not been wearing her juxta lites on her right lower extremity but now understands that she needs to do this. She did tolerate the to press compression wrap on her left lower extremity 10/29/2016 --arterial duplex study is scheduled for next week and overall she has been tolerating her compression wraps and also using her juxta lites on her right lower extremity 11/05/2016 -- the right ABI was 0.95 the left was 1.03. The digit TBI is on the right was 0.83 on the left was 0.92 and she had biphasic flow through these vessels. The impression was that of normal lower extremity arterial study. 11/12/2016 -- her pain is  minimal and she is doing very well overall. 11/26/2016 -- she has got juxta lites and her insurance will not pay for additional dual layer compression stockings. She is going to order some from [place]. 05/12/2017 -- her juxta lites are very old and too big for her and these have not been helping with compression. She did get 20-30 mm compression stockings from [place] but she and her husband are unable to put these on. I believe she will benefit from bilateral Extremit-ease, compression stockings and we will measure her for these today. 05/20/2017 -- lymphedema on the left lower extremity has increased a lot and she has a open ulceration as a result of this. The right lower extremity is looking pretty good. She has decided to by the compression stockings herself and will get reimbursed by the home health, at a later date. 05/27/2017 -- her sciatica is bothering her a lot and she thought her left leg pain was caused due to the compression wrap and hence removed it and has significant lymphedema. There is no inflammation on this left lower extremity. 06/17/17 on evaluation today patient appears to be doing very well and in fact is completely healed in regard to her ulcerations. Unfortunately however she does have continued issues with lymphedema nonetheless. We did order compression garments for her unfortunately she states that the size that she received were large although we ordered medium. Obviously this means she is not getting the optimal compression. She does not have those with her today and therefore we could not confirm and contact  the company on her behalf. Nonetheless she does state that she is going to have her husband bring them by tomorrow so that we can verify and then get in touch with the company. No fevers, chills, nausea, or vomiting noted at this time. Overall patient is doing better otherwise and I'm pleased with the progress she has made. 07/01/17 on evaluation today patient  appears to be doing very well in regard to her bilateral lower extremity she does not have any openings at this point which is excellent news. Overall I'm pleased with how things have progressed up to this time. Since she is doing so well we did order her compression which we are seeing her today to ensure that it fits her properly and everything is doing well in that regard and then subsequently she will be discharged. ============ Old Notes: 03/31/16 patient presents today for evaluation concerning open wounds that she has over the left medial ankle region as well as the left dorsal foot. She has previously had this occur although it has been healed for a number of months after having this for about a year prior until her hospitalization on 01/05/16. At that point in time it appears that she was admitted to the hospital for left lower extremity cellulitis and was placed on vancomycin and Zosyn at that point. Eventually upon discharge on January 15, 2016 she was placed on doxycycline at that point in time. Later on 03/27/16 positive wound culture growing Escherichia coli this was switched to amoxicillin. Currently she tells me that she is having pain radiated to be a 7 out of 10 which can be as high as 10 out of 10 with palpation and manipulation of the wound. This wound appears to be mainly venous in nature due to the bilateral lower extremity venous stasis/lymphedema. This is definitely much worse on her left than the right side. She does have type 1 diabetes mellitus, hypertension, morbid obesity, and is wheelchair dependent.during the course of the hospital stay a blood culture was also obtained and fortunately appeared negative. She also had an x-ray of the tibia/fibula on the left which showed no acute bone abnormality. Her white blood cell count which was performed last on 03/25/16 was 7.3, hemoglobin 12.8, protein 7.1, albumin 3.0. Her urine culture appeared to be negative for any specific  organisms. Patient did have a left lower extremity venous duplex evaluation for DVT. This did not include venous reflux studies but fortunately was negative for DVT. Patient also had arterial studies performed which revealed that she had a normal ABI on the right though this was unable to be performed on the left secondary to pain that she was having around the ankle region due to the wound. However it was stated on report that she had biphasic pulses and apparently good blood flow. ========== 06/03/17 she is here in follow-up evaluation for right lower extremity ulcer. The right lower sure he has healed but she has reopened to the left medial malleolus and dorsal foot with weeping. She is waiting for new compression garments to arrive from home health, the previous compression garments were ill fitting. We will continue with compression bilaterally and follow-up in 2 weeks Readmission: 08/05/17 on evaluation today patient appears to be doing somewhat poorly in regard to her left lower extremity especially although the right lower extremity has a small area which may no longer be open. She has been having a lot of drainage from the left lower extremity however he tells me that  she has not been able to use the EXTREMIT- EASE Compression at this point. She states that she did better and was able to actually apply the Juxta-Lite compression although the wound that she has is too large and therefore really does not compress which is why she cannot wear it at this point. She has no one who can help her put it on regular basis her son can sometimes but he's not able to do it most of the time. I do believe that's why she has begun to weave and have issues as she is currently yet again. No fevers, chills, nausea, or vomiting noted at this time. Patient is no evidence of dementia. 08/12/17 on evaluation today patient appears to still be doing fairly well in regard to the draining areas/weeping areas at  this point. With that being said she unfortunately did go to the ER yesterday due to what was felt to be possibly a cellulitis. They place her on doxycycline by mouth and discharge her home. She definitely was not admitted. With that being said she states she has had more discomfort which has been unusual for her even compared to prior times and she's had infections.08/12/17 on evaluation today patient appears to still be doing fairly well in regard to the draining areas/weeping areas at this point. With that being said she unfortunately did go to the ER yesterday due to what was felt to be possibly a cellulitis. They place her on doxycycline by mouth and discharge her home. She definitely was not admitted. With that being said she states she has had more discomfort which has been unusual for her even compared to prior times and she's had infections. 08/19/17 put evaluation today patient tells me that she's been having a lot of what sounds to be neuropathic type pain in regard to her left lower extremity. She has been using over-the-counter topical bins again which some believe. That in order to apply the she actually remove the wrap we put on her last Wednesday on Thursday. Subsequently she has not had anything on compression wise since that time. The good news is a lot of the weeping areas appear to have closed at this point again I believe she would do better with compression but we are struggling to get her to actually use what she needs to at this point. No fevers, chills, nausea, or vomiting noted at this time. 09/03/17 on evaluation today patient appears to be doing okay in regard to her lower extremities in regard to the lymphedema and weeping. Fortunately she does not seem to show any signs of infection at this point she does have a little bit of weeping occurring in the right medial malleolus area. With that being said this does not appear to be too significant which is good news. 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 She has severe skin damage secondary to both of these features involving the dorsal left foot and medial left ankle and lower leg. Still has open areas in the left anterior foot. The area on the right closed over. She uses her own juxta light stockings. She does not have an arterial issue 09/16/17 on evaluation today patient actually appears to be doing excellent in regard to her bilateral lower extremity swelling. The Juxta-Lite compression wrap seem to be doing very well for her. She has not however been using the portion that goes over her foot. Her left foot still is draining a  little bit not nearly as significant as it has been in the past but still I do believe that she likely needs to utilize the full wrap including the foot portion of this will improve as well. She also has been apparently putting on a significant amount of Vaseline which also think is not helpful for her. I recommended that if she feels she needs something for moisturizer Eucerin will probably be better. 09/30/17 on evaluation today patient presents with several new open areas in regard to her left lower extremity although these appear to be minimal and mainly seem to be more moisture breakdown than anything. Fortunately she does not seem to have any evidence of infection which is great news. She has been tolerating the dressing changes without complication we are using silver alginate on the foot she has been using AB pads to have the legs and using her Juxta-Lite compression which seems to be controlling her swelling very well. Overall I'm pleased with the poor way she has progressed. 10/14/17 on evaluation today patient appears to be doing better in regard to her left lower extremity areas of weeping. She does still have some discomfort although in general this does not appear to be as macerated and I think it  is progressing nicely. I do think she still needs to wear the foot portion of her Juxta-Lite in order to get the most benefit from the wrap obviously. She states she understands. Fortunately there does not appear to be evidence of infection at this time which is great news. 10/28/17 on evaluation today patient appears to be doing excellent in regard to her left lower extremity. She has just a couple areas that are still open and seem to be causing any trouble whatsoever. For that reason I think that she is definitely headed in the right direction the spots are very tiny compared to what we have been dealing with in the past. 11/11/17 on evaluation today patient appears to have a right lateral lower extremity ulcer that has opened since I last saw her. She states this is where the home health nurse that was coming out remove the dressing without wetting the alginate first. Nonetheless I do not know if this is indeed the case or not but more importantly we have not ordered home help to be coming out for her wounds at all. I'm unsure as to why they are coming out and we're gonna have to check on this and get things situated in that regard. With that being said we currently really do not need them to be coming out as the patient has been taking care of her leg herself without complication and no issues. In fact she was doing much better prior to nursing coming out. 11/25/17 on evaluation today patient actually appears to be doing fairly well in regard to her left lower extremity swelling. In fact she has very little area of weeping at this point there's just a small spot on the lateral portion of her right leg that still has me just a little bit more concerned as far as wanting to see this clear up before I discharge her to caring for this at home. Nonetheless overall she has made excellent progress. 12/09/17 on evaluation today patient appears to be doing rather well in regard to her lower extremity  edema. She does have some weeping still in the left lower extremity although the big area we were taking care of two weeks ago actually has closed and she has another  area of weeping on the left lower extremity immediately as well is the top of her foot. She does not currently have lymphedema pumps she has been wearing her compression daily on a regular basis as directed. With that being said I think she may benefit from lymphedema pumps. 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 We had a very difficult time getting and keeping this under control. 12/23/17 on evaluation today patient actually appears to be doing a little bit more poorly in regard to her bilateral lower extremities. She has been tolerating the Juxta-Lite compression wraps. Unfortunately she has two new ulcers on the right lower extremity and left lower Trinity ulceration seems to be larger. Obviously this is not good news. She has been tolerating the dressings without complication. 12/30/17 on evaluation today patient actually appears to be doing much better in regard to her bilateral lower extremity edema. She continues to have some issues with ulcerations and in fact there appears to be one spot on each leg where the wrap may have caused a little bit of a blister which is subsequently opened up at this point is given her pain. Fortunately it does not appear to be any evidence of infection which is good news. No fevers chills noted. 01/13/18 on evaluation today patient appears to be doing rather well in regard to her bilateral lower extremities. The dressings did get kind of stuck as far as the wound beds are concerned but again I think this is mainly due to the fact that she actually seems to be showing signs of healing which is good news. She's not having as much drainage  therefore she was having more of the dressing sticking. Nonetheless overall I feel like her swelling is dramatically down compared to previous. 01/20/18 on evaluation today patient unfortunately though she's doing better in most regards has a large blister on the left anterior lower extremity where she is draining quite significantly. Subsequently this is going to need debridement today in order to see what's underneath and ensure she does not continue to trapping fluid at this location. Nonetheless No fevers, chills, nausea, or vomiting noted at this time. 01/27/18 on evaluation today patient appears to be doing rather well at this point in regard to her right lower extremity there's just a very small area that she still has open at this point. With that being said I do believe that she is tolerating the compression wraps very well in making good progress. Home health is coming out at this point to see her. Her left lower extremity on the lateral portion is actually what still mainly open and causing her some discomfort for the most part 02/10/18 on evaluation today patient actually appears to be doing very well in regard to her right lower extremity were all the ulcers appear to be completely close. In regard to the left lower extremity she does have two areas still open and some leaking from the dorsal surface of her foot but this still seems to be doing much better to me in general. 02/24/18 on evaluation today patient actually appears to be doing much better in regard to her right lower extremity this is still completely healed. Her left lower extremity is also doing much better fortunately she has no evidence of infection. The one area that is gonna require some debridement is still  on the left anterior shin. Fortunately this is not hurting her as badly today. 03/10/18 on evaluation today patient appears to be doing better in some regards although she has a little bit more open area on the dorsal  foot and she also has some issues on the medial portion of the left lower extremity which is actually new and somewhat deep. With that being said there fortunately does not appear to be any significant signs of infection which is good news. No fevers, chills, nausea, or vomiting noted at this time. In general her swelling seems to be doing fairly well which is good news. 03/31/18 on evaluation today patient presents for follow-up concerning her left lower extremity lymphedema. Unfortunately she has been doing a little bit more poorly since I last saw her in regard to the amount of weeping that she is experiencing. She's also having some increased pain in the anterior shin location. Unfortunately I do not feel like the patient is making such good progress at this point a few weeks back she was definitely doing much better. 04/07/18 on evaluation today patient actually appears to be showing some signs of improvement as far as the left lower extremity is concerned. She has been tolerating the dressing changes and it does appear that the Drawtex did better for her. With that being said unfortunately home health is stating that they cannot obtain the Drawtex going forward. Nonetheless we're gonna have to check and see what they may be able to get the alginate they were using was getting stuck in causing new areas of skin being pulled all that with and subsequently weep and calls her to worsen overall this is the first time we've seen improvement at this time. 04/14/18 on evaluation today patient actually appears to be doing rather well at this point there does not appear to be any evidence of infection at this time and she is actually doing excellent in regard to the weeping in fact she almost has no openings remaining even compared to just last week this is a dramatic improvement. No fevers chills noted 04/21/18 evaluation today patient actually appears to be doing very well. She in fact is has a small  area on the posterior lower extremity location and she has a small area on the dorsal surface of her foot that are still open both of which are very close to closing. We're hoping this will be close shortly. She brought her Juxta-Lite wrap with her today hoping that would be able to put her in it unfortunately I don't think were quite at that point yet but we're getting closer. 04/28/18 upon evaluation today patient actually appears to be doing excellent in regard to her left lower extremity ulcer. In fact the region on the posterior lower extremity actually is much smaller than previously noted. Overall I'm very happy with the progress she has made. She again did bring her Juxta-Lite although we're not quite ready for that yet. 05/11/18 upon evaluation today patient actually appears to be doing in general fairly well in regard to her left lower Trinity. The swelling is very well controlled. With that being said she has a new area on the left anterior lower extremity as well as between the first and second toes of her left foot that was not present during the last evaluation. The region of her posterior left lower extremity actually appears to be almost completely healed. To be honest I'm very pleased with the way that stands. Nonetheless I do believe  that the lotion may be keeping the area to moist as far as her legs are concerned subsequently I'm gonna consider discontinuing that today. 05/26/18 on evaluation today patient appears to be doing rather well in regard to her left lower should be ulcers. In fact everything appears to be close except for a very small area on the left posterior lower extremity. Fortunately there does not appear to be any evidence of infection at this time. Overall very pleased with her progress. 06/02/18 and evaluation today patient actually appears to be doing very well in regard to her lower extremity ulcers. She has one small area that still continues to weep that I  think may benefit her being able to justify lotion and user Juxta-Lite wraps versus continued to wrap her. Nonetheless I think this is something we can definitely look into at this point. 06/23/18 on evaluation today patient unfortunately has openings of her bilateral lower extremities. In general she seems to be doing much worse than when I last saw her just as far as her overall health standpoint is concerned. She states that her discomfort is mainly due to neuropathy she's not having any other issues otherwise. No fevers, chills, nausea, or vomiting noted at this time. 06/30/18 on evaluation today patient actually appears to be doing a little worse in regard to her right lower extremity her left lower extremity of doing fairly well. Fortunately there is no sign of infection at this time. She has been tolerating the dressing changes without complication. Home health did not come out like they were supposed to for the appropriate wrap changes. They stated that they never received the orders from [place] which were fax. Nonetheless we will send a copy of the orders with the patient today as well. 07/07/18 on evaluation today patient appears to be doing much better in regard to lower extremities. She still has several openings bilaterally although since I last saw her her legs did show obvious signs of infection when she later saw her nurse. Subsequently a culture was obtained and she is been placed on Bactrim and Keflex. Fortunately things seem to be looking much better it does appear she likely had an infection. Again last week we'd even discussed it but again there really was not any obvious sign that she had infection therefore we held off on the antibiotics. Nonetheless I'm glad she's doing better today. 07/14/18 on evaluation today patient appears to be doing much better regarding her bilateral lower Trinity's. In fact on the right lower for me there's nothing open at this point there are some dry skin  areas at the sites where she had infection. Fortunately there is no evidence of systemic infection which is excellent news. No fevers chills noted 07/21/18 on evaluation today patient actually appears to be doing much better in regard to her left lower extremity ulcers. She is making good progress and overall I feel like she's improving each time I see her. She's having no pain I do feel like the infection is completely resolved which is excellent news. No fevers, chills, nausea, or vomiting noted at this time. 07/28/18 on evaluation today patient appears to be doing very well in regard to her left lower [employer]. Everything seems to be showing signs of improvement which is excellent news. Overall very pleased with the progress that has been made. Fortunately there's no evidence of active infection at this time also excellent news. 08/04/18 on evaluation today patient appears to be doing more poorly in regard  to her bilateral lower extremities. She has two new areas open up on the right and these were completely closed as of last week. She still has the two spots on the left which in my pinion seem to be doing better. Fortunately there's no evidence of infection again at this point. 08/11/18 on evaluation today patient actually appears to be doing very well in regard to her bilateral lower Trinity wounds that all seem to be doing better and are measures smaller today. Fortunately there's no signs of infection. No fevers, chills, nausea, or vomiting noted at this time. 08/18/18 on evaluation today patient actually appears to be doing about the same inverter bilateral lower extremities. She continues to have areas that blistering open as was drain that fortunately nothing too significant. Overall I feel like Drawtex may have done better for her however compared to the collagen. 08/25/18 on evaluation today patient appears to be doing a little bit more poorly today even compared to last time I saw  her. Again I'm not exactly sure why she's making worse progress over the past several weeks. I'm beginning to wonder if there is some kind of underlying low level infection causing this issue. I did actually take a culture from the left anterior lower extremity but it was a new wound draining quite a bit at this point. Unfortunately she also seems to be having more pain which is what also makes me worried about the possibility of infection. This is despite never erythema noted at this point. 09/01/18 on evaluation today patient actually appears to be doing a little worse even compared to last week in regard to bilateral lower extremities. She did go to the hospital on the 16th was given a dose of IV Zosyn and then discharged with a recommendation to continue with the Bactrim that I previously prescribed for her. Nonetheless she is still having a lot of discomfort she tells me as well at this time. This is definitely unfortunate. No fevers, chills, nausea, or vomiting noted at this time. 09/08/18 on evaluation today patient's bilateral lower extremities actually appear to be shown signs of improvement which is good news. Fortunately there does not appear to be any signs of active infection I think the anabiotic is helping in this regard. Overall I'm very pleased with how she is progressing. 09/15/18 patient was actually seen in ER yesterday due to her legs as well unfortunately. She states she's been having a lot of pain and discomfort as well as a lot of drainage. Upon inspection today the patient does have a lot of swelling and drainage I feel like this is more related to lymphedema and poor fluid control than it is to infection based on what I'm seeing. The physician in the emergency department also doubted that the patient was having a significant infection nonetheless I see no evidence of infection obvious at this point although I do see evidence of poor fluid control. She still not using a  compression pumps, she is not elevating due to her lift chair as well as her hospital bed being broken, and she really is not keeping her legs up as much as they should be and also has been taking off her wraps. All this combined I think has led to poor fluid control and to be honest she may be somewhat volume overloaded in general as well. I recommend that she may need to contact your physician to see if a prescription for a diuretic would be beneficial in their opinion.  As long as this is safe I think it would likely help her. 09/29/18 on evaluation today patient's left lower extremity actually appears to be doing quite a bit better. At least compared to last time that I saw her. She still has a large area where she is draining from but there's a lot of new skin speckled trout and in fact there's more new skin that there are open areas of weeping and drainage at this point. This is good news. With regard to the right lower extremity this is doing much better with the only open area that I really see being a dry spot on the right lateral ankle currently. Fortunately there's no signs of active infection at this time which is good news. No fevers, chills, nausea, or vomiting noted at this time. The patient seems somewhat stressed and overwhelmed during the visit today she was very lethargic as such. She does and she is not taking any pain medications at this point. Apparently according to her husband are also in the process of moving which is probably taking its toll on her as well. 10/06/18 on evaluation today patient appears to be doing rather well in regard to her lower extremities compared to last evaluation. Fortunately there's no signs of active infection. She tells me she did have an appointment with her primary. Nonetheless he was concerned that the wounds were somewhat deep based on pictures but we never actually saw her legs. She states that he had her somewhat worried due to the fact that  she was fearing now that she was [place] have to have an amputation. With that being said based on what I'm seeing check she looks better this week that she has the last two times I've seen her with much less drainage I'm actually pleased in this regard. That doesn't mean that she's out of the water but again I do not think what the point of talking about education at all in regard to her leg. She is very happy to hear this. She is also not having as much pain as she was having last week. 10/13/18 unfortunately on evaluation today patient still continues to have a significant amount of drainage she's not letting home health actually apply the compression dressings at this point. She's trying to use of Juxta-Lite of the top of Kerlex and the second layer of the three layer compression wrap. With that being said she just does not seem to be making as good a progress as I would expect if she was having the compression applied and in place on a regular basis. No fevers, chills, nausea, or vomiting noted at this time. 10/20/18 on evaluation today patient appears to be doing a little better in regard to her bilateral lower extremity ulcers. In fact the right lower extremity seems to be healed she doesn't even have any openings at this point left lower extremity though still somewhat macerated seems to be showing signs of new skin growth at multiple locations throughout. Fortunately there's no evidence of active infection at this time. No fevers, chills, nausea, or vomiting noted at this time. 10/27/18 on evaluation today patient appears to be doing much better in regard to her left lower Trinity ulcer. She's been tolerating the laptop complication and has minimal drainage noted at this point. Fortunately there's no signs of active infection at this time. No fevers, chills, nausea, or vomiting noted at this time. 11/03/18 on evaluation today patient actually appears to be doing excellent in regard to  her left  lower extremity. She is having very little drainage at this point there does not appear to be any significant signs of infection overall very pleased with how things have gone. She is likewise extremely pleased still and seems to be making wonderful progress week to week. I do believe antibiotics were helpful for her. Her primary care provider did place on amateur clean since I last saw her. 11/17/18 on evaluation today patient appears to be doing worse in regard to her bilateral lower extremities at this point. She is been tolerating the dressing changes without complication. With that being said she typically takes the Coban off fairly quickly upon arriving home even after being seen here in the clinic and does not allow home health reapply command as part of the dressing at home. Therefore she said no compression essentially since I last saw her as best I can tell. With that being said I think it shows and how much swelling she has in the open wounds that are noted at this point. Fortunately there's no signs of infection but unfortunately if she doesn't get this under control I think she will end up with infection and more significant issues. 11/24/18 on evaluation today patient actually appears to be doing somewhat better in regard to her bilateral lower extremities. 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 Nonetheless that is doing better currently so she should be able to attempt reinitiating the lymphedema pumps at this time. No fevers, chills, nausea, or vomiting noted at this time. 12/01/18 upon evaluation today patient's left lower extremity appears to be doing quite well unfortunately her right lower extremity is not doing nearly as well. She has been tolerating the dressing changes without complication unfortunately she did not keep a wrap on the  right at this time. Nonetheless I believe this has led to increased swelling and weeping in the world is actually much larger than during the last evaluation with her. 12/08/18 on evaluation today patient appears to be doing about the same at this point in regard to her right lower extremity. There is some more palatable to touch I'm concerned about the possibility of there being some infection although I think the main issue is she's not keeping her compression wrap on which in turn is not allowing this area to heal appropriately. 12/22/18 on evaluation today patient appears to be doing better in regard to left lower extremity unfortunately significantly worse in regard to the right lower extremity. The areas of blistering and necrotic superficial tissue have spread and again this does not really appear to be signs of infection and all she just doesn't seem to be doing nearly as well is what she has been in the past. Overall I feel like the Augmentin did absolutely nothing for her she doesn't seem to have any infection again I really didn't think so last time either is more of a potential preventative measure and hoping that this would make some difference but I think the main issue is she's not wearing her compression. She tells me she cannot wear the [place] been we put on she takes it off pretty much upon getting home. Subsequently she worshiped Juxta-Lite when I questioned her about how often she wears it this is no more than three hours a day obviously that leaves 21  hours that she has no compression and this is obviously not doing well for her. Overall I'm concerned that if things continue to worsen she is at great risk of both infection as well as losing her leg. 01/05/19 on evaluation today patient appears to be doing well in regard to her left lower extremity which he is allowing [place] to wrap and not so well with regard to her right lower extremity which she is not allowing [place] to really wrap  and keep the wrap on. She states that it hurts too badly whenever it's wrapped and she ends up having to take it off. She's been using the Juxta-Lite she tells me up to six hours a day although I question whether or not that's really been the case to be honest. Previously she told me three hours today nonetheless obviously the legs as long as the wrap is doing great when she is not is doing much more poorly. 01/12/2019 on evaluation today patient actually appears to be doing a little better in my opinion with regard to her right lower extremity ulcer. She has a small open area on the left lower extremity unfortunately but again this I think is part of the normal fluctuation of what she is going to have to expect with regard to her legs especially when she is not using her lymphedema pumps on a regular basis. Subsequently based on what I am seeing today I think that she does seem to be doing slightly better with regard to her right lower extremity she did see her primary care provider on Monday they felt she had an infection and placed her on 2 antibiotics. Both Cipro and clindamycin. Subsequently again she seems possibly to be doing a little bit better in regards to the right lower extremity she also tells me however she has been wearing the compression wrap over the past week since I spoke with her as well that is a Kerlix and Coban wrap on the right. No fevers, chills, nausea, vomiting, or diarrhea. 01/19/2019 on evaluation today patient appears to be doing better with regard to her bilateral lower extremities especially the right. I feel like the compression has been beneficial for her which is great news. She did get a call from her primary care provider on her way here today telling her that she did have methicillin-resistant Staphylococcus aureus and he was calling in a couple new antibiotics for her including a ointment to be applied she tells me 3 times a day. With that being said this sounds  like likely to be Bactroban which I think could be applied with each dressing/wrap change but I would not be able to accommodate her applying this 3 times a day. She is in agreement with the least doing this we will add that to her orders today. 01/26/2019 on evaluation today patient actually appears to be doing much better with regard to her right lower extremity. Her left lower extremity is also doing quite well all things considering. Fortunately there is no evidence of active infection at this time. No fevers, chills, nausea, vomiting, or diarrhea. 02/02/2019 on evaluation today patient appears to be doing much better compared to her last evaluation. Little by little off like her right leg is returning more towards normal. There does not appear to be any signs of active infection and overall she seems to be doing quite well which is great news. I am very pleased in this regard. No fevers, chills, nausea, vomiting, or diarrhea. 02/09/2019  upon evaluation today patient appears to be doing better with regard to her bilateral lower extremities. She has been tolerating the dressing changes without complication. Fortunately there is no signs of active infection at this time. No fevers, chills, nausea, vomiting, or diarrhea. 02/23/2019 on evaluation today patient actually appears to be doing quite well with regard to her bilateral lower extremities. She has been tolerating the dressing changes without complication. She is even used her pumps one time and states that she really felt like it felt good. With that being said she seems to be in good spirits and her legs appear to be doing excellent. 03/09/2019 on evaluation today patient appears to be doing well with regard to her right lower extremity there are no open wounds at this time she is having some discomfort but I feel like this is more neuropathy than anything. With regard to her left lower extremity she had several areas scattered around that she  does have some weeping and drainage from but again overall she does not appear to be having any significant issues and no evidence of infection at this time which is good news. 03/23/2019 on evaluation today patient appears to be doing well with regard to her right lower extremity which she tells me is still close she is using her juxta light here. Her left lower extremity she mainly just has an area on the foot which is still slightly draining although this also is doing great. Overall very pleased at this time. 04/06/2019 patient appears to be doing a little bit worse in regard to her left lower extremity upon evaluation today. She feels like this could be becoming infected again which she had issues with previous. Fortunately there is no signs of systemic infection but again this is always a struggle with her with her legs she will go from doing well to not so well in a very short amount of time. 04/20/2019 on evaluation today patient actually appears to be doing quite well with regard to her right lower extremity I do not see any signs of active infection at this time. Fortunately there is no fever chills noted. She is still taking the antibiotics which I prescribed for her at this point. In regard to the left lower extremity I do feel like some of these areas are better although again she still is having weeping from several locations at this time. 04/27/2019 on evaluation today patient appears to be doing about the same if not slightly worse in regard to her left lower extremity ulcers. She tells me when questioned that she has been sleeping in her Hoveround chair in fact she tells me she falls asleep without even knowing it. I think she is spending a whole lot of time in the chair and less time walking and moving around which is not good for her legs either. On top of that she is in a seated position which is also the worst position she is not really elevating her legs and she is also not  using her lymphedema pumps. All this is good to contribute to worsening of her condition in general. 05/18/2019 on evaluation today patient appears to be doing well with regard to her lower extremity on the right in fact this is showing no signs of any open wounds at this time. On the left she is continuing to have issues with areas that do drain. Some of the regions have healed and there are couple areas that have reopened. She did go to  the ER per the patient according to recommendations from the home health nurse due to what she was seen when she came out on 05/13/2019. Subsequently she felt like the patient needed to go to the hospital due to the fact that again she was having "milky white discharge" from her leg. Nonetheless she had and then was placed on doxycycline and subsequently seems to be doing better. 06/01/2019 upon evaluation today patient appears to be doing really in my opinion about the same. I do not see any signs of active infection which is good news. Overall she still has wounds over the bilateral lower extremities she has reopened on the right but this appears to be more of a crack where there is weeping/edema coming from the region. I do not see any evidence of infection at either site based on what I visualized today. 07/13/2019 upon evaluation today patient appears to be doing a little worse compared to last time I saw her. She since has been in the hospital from 06/21/2019 through 06/29/2019. This was secondary to having Covid. During that time they did apply lotion to her legs which unfortunately has caused her to develop a myriad of open wounds on her lower extremities. Her legs do appear to be doing better as far as the overall appearance is concerned but nonetheless she does have more open and weeping areas. 07/27/2019 upon evaluation today patient appears to be doing more poorly to be honest in regard to her left lower extremity in particular. There is no signs of  systemic infection although I do believe she may have local infection. She notes she has been having a lot of blue/green drainage which is consistent potentially with Pseudomonas. That may be something that we need to consider here as well. The doxycycline does not seem to have been helping. 08/03/2019 upon evaluation today patient appears to be doing a little better in my opinion compared to last week's evaluation. Her culture I did review today and she is on appropriate medications to help treat the Enterobacter that was noted. Overall I feel like that is good news. With that being said she is unfortunately continuing to have a lot of drainage and though it is doing better I still think she has a long ways to go to get things dried up in general. Fortunately there is no signs of systemic infection. 08/10/2019 upon evaluation today patient appears to be doing may be slightly better in regard to her left lower extremity the right lower extremity is doing much better. Fortunately there is no signs of infection right now which is good news. No fevers, chills, nausea, vomiting, or diarrhea. 08/24/2019 on evaluation today patient appears to be doing slightly better in regard to her lower extremities. The left lower extremity seems to be healed the right lower extremity is doing better though not completely healed as far as the openings are concerned. She has some generalized issues here with edema and weeping secondary to her lymphedema though again I do believe this is little bit drier compared to prior weeks evaluations. In general I am very pleased with how things seem to be progressing. No fevers, chills, nausea, vomiting, or diarrhea. 08/31/2019 upon evaluation today patient actually seems to making some progress here with regard to the left lower extremity in particular. She has been tolerating the dressing changes without complication. Fortunately there is no signs of active infection at this time.  No fevers, chills, nausea, vomiting, or diarrhea. She did see Dr. Doren Custard  and he did note that she did have a issue with the left great saphenous vein and the small saphenous vein in the leg. With that being said he was concerned about the possibility of laser ablation not being extremely successful. He also mentioned a small risk of DVT associated with the procedure. However if the wounds do not continue to improve he stated that that would probably be the way to go. Fortunately the patient's legs do seem to be doing much better. Electronic Signature(s) Signed: 08/31/2019 12:59:54 PM By: [name] PA-C Entered By: [name] on 08/31/2019 12:59:53 -------------------------------------------------------------------------------- Physical Exam Details Patient Name: Date of Service: DAKSHA, KOONE 08/31/2019 11:00 AM Medical Record [id number] Patient Account Number: [id number] Date of Birth/Sex: Treating RN: [birth year] (69 y.o. [name] Primary Care Provider: [name] Other Clinician: Referring Provider: Treating Provider/Extender:Stone III, [name], FRED Weeks in Treatment: 30 Constitutional Well-nourished and well-hydrated in no acute distress. Respiratory normal breathing without difficulty. Psychiatric this patient is able to make decisions and demonstrates good insight into disease process. Alert and Oriented x 3. pleasant and cooperative. Notes Patient's wound bed currently showed signs of good granulation and epithelization at this point. Again there is much more new skin growth compared to previous which is great news overall I am extremely pleased with how things seem to be progressing. Electronic Signature(s) Signed: 08/31/2019 1:00:12 PM By: [name] PA-C Entered By: [name] on 08/31/2019 13:00:11 -------------------------------------------------------------------------------- Physician Orders Details Patient Name: Date of  Service: [name]. 08/31/2019 11:00 AM Medical Record [id number] Patient Account Number: [id number] Date of Birth/Sex: Treating RN: 1951-05-17 (68 y.o. [name] Primary Care Provider: [name] Other Clinician: Referring Provider: Treating Provider/Extender:Stone III, [name], FRED Weeks in Treatment: [phone] Verbal / Phone Orders: No Diagnosis Coding ICD-10 Coding Code Description E11.622 Type 2 diabetes mellitus with other skin ulcer I89.0 Lymphedema, not elsewhere classified I87.331 Chronic venous hypertension (idiopathic) with ulcer and inflammation of right lower extremity I87.332 Chronic venous hypertension (idiopathic) with ulcer and inflammation of left lower extremity L97.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 leg with fat layer exposed L97.822 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 I10 Essential (primary) hypertension E66.01 Morbid (severe) obesity due to excess calories F41.8 Other specified anxiety disorders R53.1 Weakness Follow-up Appointments Return Appointment in 1 week. Dressing Change Frequency Wound #61 Left,Circumferential Lower Leg Change dressing three times week. Other: - may remove wrap from right leg and wear juxtalite Wound #64 Left,Dorsal Foot Change dressing three times week. Other: - may remove wrap from right leg and wear juxtalite Skin Barriers/Peri-Wound Care Barrier cream - zinc oxide cream to any macerated areas Moisturizing lotion - to right leg daily Wound Cleansing Clean wound with Wound Cleanser - all wounds May shower with protection. Primary Wound Dressing Wound #61 Left,Circumferential Lower Leg Calcium Alginate with Silver Wound #64 Left,Dorsal Foot Calcium Alginate with Silver Secondary Dressing Wound #61 Left,Circumferential Lower Leg Dry Gauze - all  wounds ABD pad - as needed Zetuvit or Kerramax - as needed Wound #64 Left,Dorsal Foot Dry Gauze - all wounds ABD pad - as needed Edema Control 3 Layer Compression System - Left Lower Extremity Avoid standing for long periods of time - walking is encouraged Elevate legs to the level of the heart or above for 30 minutes daily and/or when sitting,  a frequency of: - do not sleep in chair with feet dangling, MUST elevate legs while sitting Exercise regularly Support Garment 20-30 mm/Hg pressure to: - Juxtalite to right lower leg daily Segmental Compressive Device. - lymphedema pumps 60 minutes 1- 2 times per day Off-Loading Turn and reposition every 2 hours Additional Orders / Instructions Follow Nutritious Diet - To include vitamin A, vitamin C, and Zinc along with increased protein intake. [place] skilled [facility] for wound care. - [facility] Electronic Signature(s) Signed: 08/31/2019 1:07:46 PM By: [name] PA-C Signed: 08/31/2019 4:42:28 PM By: [name] RN, BSN Entered By: [name] on 08/31/2019 12:21:39 -------------------------------------------------------------------------------- Problem List Details Patient Name: Date of Service: [name]. 08/31/2019 11:00 AM Medical Record [id number] Patient Account Number: [id number] Date of Birth/Sex: Treating RN: [birth year] (69 y.o. [name] Primary Care Provider: [name] Other Clinician: Referring Provider: Treating Provider/Extender:Stone III, [name], FRED Weeks in Treatment: 108 Active Problems ICD-10 Evaluated Encounter Code Description Active Date Today Diagnosis E11.622 Type 2 diabetes mellitus with other skin ulcer 08/05/2017 No Yes I89.0 Lymphedema, not elsewhere classified 08/05/2017 No Yes I87.331 Chronic venous hypertension (idiopathic) with ulcer 08/05/2017 No Yes and inflammation of right lower extremity I87.332 Chronic venous hypertension  (idiopathic) with ulcer 08/05/2017 No Yes and inflammation of left lower extremity L97.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 08/05/2017 No Yes leg with fat layer exposed L97.822 Non-pressure chronic ulcer of other part of left lower 08/05/2017 No Yes leg with fat layer exposed L97.522 Non-pressure chronic ulcer of other part of left foot 06/01/2019 No Yes with fat layer exposed I10 Essential (primary) hypertension 08/05/2017 No Yes E66.01 Morbid (severe) obesity due to excess calories 08/05/2017 No Yes F41.8 Other specified anxiety disorders 08/05/2017 No Yes R53.1 Weakness 08/05/2017 No Yes Inactive Problems Resolved Problems Electronic Signature(s) Signed: 08/31/2019 11:46:32 AM By: [name] PA-C Entered By: [name] on 08/31/2019 11:46:31 -------------------------------------------------------------------------------- Progress Note Details Patient Name: Date of Service: [name]. 08/31/2019 11:00 AM Medical Record [id number] Patient Account Number: [id number] Date of Birth/Sex: Treating RN: Aug 09, 1950 (69 y.o. [name] Primary Care Provider: [name] Other Clinician: Referring Provider: Treating Provider/Extender:Stone III, [name], FRED Weeks in Treatment: 108 Subjective Chief Complaint Information obtained from Patient Bilateral reoccurring LE ulcers History of Present Illness (HPI) this patient has been seen a couple of times before and returns with recurrent problems to her right and left lower extremity with swelling and weeping ulcerations due to not wearing her compression stockings which she had been advised to do during her last discharge, at the end of June 2018. During her last visit the patient had had normal arterial blood flow and her venous reflux study did not necessitate any surgical intervention. She was recommended compression and elevation and wound care. After prolonged treatment the patient was  completely healed but she has been noncompliant with wearing or compressions.. She was here last week with an outpatient return visit planned but the patient came in a very poor general condition with altered mental status and was rushed to the ER on my request. With a history of hypertension, diabetes, TIA and right-sided weakness she was set up for an MRI on her brain and cervical spine and was sent to [facility]. Getting an MRI done was very difficult but once the workup was done she was found not to have any spinal stenosis, epidural abscess or hematoma or discitis. This was radiculopathy to be  treated as an outpatient and she was given a follow-up appointment. Today she is feeling much better alert and oriented and has come to reevaluate her bilateral lower extremity lymphedema and ulceration 03/25/2017 -- she was admitted to the hospital on 03/16/2017 and discharged on 03/18/2017 with left leg cellulitis and ulceration. She was started on vancomycin and Zosyn and x-ray showed no bony involvement. She was treated for a cellulitis with IV antibiotics changed to Rocephin and Flagyl and was discharged on oral Keflex and doxycycline to complete a 7 day course. Last hemoglobin A1c was 7.1 and her other ailments including hypertension got asthma were appropriately treated. 05/06/2017 -- she is awaiting the right size of compression stockings from [place] but other than that has been doing well. ====== Old notes 69 year old patient was seen one time last October and was lost to follow-up. She has recurrent problems with weeping and ulceration of her left lower extremity and has swelling of this for several years. It has been worse for the last 2 months. Past medical history is significant for diabetes mellitus type 2, hypertension, gout, morbid obesity, depressive disorders, hiatal hernia, migraines, status post knee surgery, risk of a cholecystectomy, vaginal hysterectomy and breast biopsy. She  is not a smoker. As noted before she has never had a venous duplex study and an arterial ABI study was attempted but the left lower extremity was noncompressible 10/01/2016 -- had a lower extremity venous duplex reflux evaluation which showed no evidence of deep vein reflux in the right or left lower extremity, and no evidence of great saphenous vein reflux more than 500 ms in the right or left lower extremity, and the left small saphenous vein is incompetent but no vascular consult was recommended. review of her electronic medical records noted that the ABI was checked in July 2017 where the right ABI was normal limits and the left ABI could not be ascertained due to pain with cuff pressure but the waveforms are within normal limits. her arterial duplex study scheduled for April 27. 10/08/2016 -- the patient has various reasons for not having a compression on and for the last 3 days she has had no compression on her left lower extremity either due to pain or the lack of nursing help. She does not use her juxta lites either. 10/15/2016 -- the patient did not keep her appointment for arterial duplex study on April 27 and I have asked her to reschedule this. Her pain is out of proportion with the physical findings and she continuously fails to wear a compression wraps and cuts them off because she says she cannot tolerate the pain. She does not use her juxta lites either. 10/22/2016 -- he has rescheduled her arterial duplex study to May 21 and her pain today is a bit better. She has not been wearing her juxta lites on her right lower extremity but now understands that she needs to do this. She did tolerate the to press compression wrap on her left lower extremity 10/29/2016 --arterial duplex study is scheduled for next week and overall she has been tolerating her compression wraps and also using her juxta lites on her right lower extremity 11/05/2016 -- the right ABI was 0.95 the left was 1.03.  The digit TBI is on the right was 0.83 on the left was 0.92 and she had biphasic flow through these vessels. The impression was that of normal lower extremity arterial study. 11/12/2016 -- her pain is minimal and she is doing very well overall. 11/26/2016 --  she has got juxta lites and her insurance will not pay for additional dual layer compression stockings. She is going to order some from [facility]. 05/12/2017 -- her juxta lites are very old and too big for her and these have not been helping with compression. She did get 20-30 mm compression stockings from [facility] but she and her husband are unable to put these on. I believe she will benefit from bilateral Extremit-ease, compression stockings and we will measure her for these today. 05/20/2017 -- lymphedema on the left lower extremity has increased a lot and she has a open ulceration as a result of this. The right lower extremity is looking pretty good. She has decided to by the compression stockings herself and will get reimbursed by the home health, at a later date. 05/27/2017 -- her sciatica is bothering her a lot and she thought her left leg pain was caused due to the compression wrap and hence removed it and has significant lymphedema. There is no inflammation on this left lower extremity. 06/17/17 on evaluation today patient appears to be doing very well and in fact is completely healed in regard to her ulcerations. Unfortunately however she does have continued issues with lymphedema nonetheless. We did order compression garments for her unfortunately she states that the size that she received were large although we ordered medium. Obviously this means she is not getting the optimal compression. She does not have those with her today and therefore we could not confirm and contact the company on her behalf. Nonetheless she does state that she is going to have her husband bring them by tomorrow so that we can verify and then get in touch  with the company. No fevers, chills, nausea, or vomiting noted at this time. Overall patient is doing better otherwise and I'm pleased with the progress she has made. 07/01/17 on evaluation today patient appears to be doing very well in regard to her bilateral lower extremity she does not have any openings at this point which is excellent news. Overall I'm pleased with how things have progressed up to this time. Since she is doing so well we did order her compression which we are seeing her today to ensure that it fits her properly and everything is doing well in that regard and then subsequently she will be discharged. ============ Old Notes: 03/31/16 patient presents today for evaluation concerning open wounds that she has over the left medial ankle region as well as the left dorsal foot. She has previously had this occur although it has been healed for a number of months after having this for about a year prior until her hospitalization on 01/05/16. At that point in time it appears that she was admitted to the hospital for left lower extremity cellulitis and was placed on vancomycin and Zosyn at that point. Eventually upon discharge on January 15, 2016 she was placed on doxycycline at that point in time. Later on 03/27/16 positive wound culture growing Escherichia coli this was switched to amoxicillin. Currently she tells me that she is having pain radiated to be a 7 out of 10 which can be as high as 10 out of 10 with palpation and manipulation of the wound. This wound appears to be mainly venous in nature due to the bilateral lower extremity venous stasis/lymphedema. This is definitely much worse on her left than the right side. She does have type 1 diabetes mellitus, hypertension, morbid obesity, and is wheelchair dependent.during the course of the hospital stay a blood  culture was also obtained and fortunately appeared negative. She also had an x-ray of the tibia/fibula on the left which showed  no acute bone abnormality. Her white blood cell count which was performed last on 03/25/16 was 7.3, hemoglobin 12.8, protein 7.1, albumin 3.0. Her urine culture appeared to be negative for any specific organisms. Patient did have a left lower extremity venous duplex evaluation for DVT. This did not include venous reflux studies but fortunately was negative for DVT. Patient also had arterial studies performed which revealed that she had a normal ABI on the right though this was unable to be performed on the left secondary to pain that she was having around the ankle region due to the wound. However it was stated on report that she had biphasic pulses and apparently good blood flow. ========== 06/03/17 she is here in follow-up evaluation for right lower extremity ulcer. The right lower sure he has healed but she has reopened to the left medial malleolus and dorsal foot with weeping. She is waiting for new compression garments to arrive from home health, the previous compression garments were ill fitting. We will continue with compression bilaterally and follow-up in 2 weeks Readmission: 08/05/17 on evaluation today patient appears to be doing somewhat poorly in regard to her left lower extremity especially although the right lower extremity has a small area which may no longer be open. She has been having a lot of drainage from the left lower extremity however he tells me that she has not been able to use the EXTREMIT- EASE Compression at this point. She states that she did better and was able to actually apply the Juxta-Lite compression although the wound that she has is too large and therefore really does not compress which is why she cannot wear it at this point. She has no one who can help her put it on regular basis her son can sometimes but he's not able to do it most of the time. I do believe that's why she has begun to weave and have issues as she is currently yet again. No fevers, chills,  nausea, or vomiting noted at this time. Patient is no evidence of dementia. 08/12/17 on evaluation today patient appears to still be doing fairly well in regard to the draining areas/weeping areas at this point. With that being said she unfortunately did go to the ER yesterday due to what was felt to be possibly a cellulitis. They place her on doxycycline by mouth and discharge her home. She definitely was not admitted. With that being said she states she has had more discomfort which has been unusual for her even compared to prior times and she's had infections.08/12/17 on evaluation today patient appears to still be doing fairly well in regard to the draining areas/weeping areas at this point. With that being said she unfortunately did go to the ER yesterday due to what was felt to be possibly a cellulitis. They place her on doxycycline by mouth and discharge her home. She definitely was not admitted. With that being said she states she has had more discomfort which has been unusual for her even compared to prior times and she's had infections. 08/19/17 put evaluation today patient tells me that she's been having a lot of what sounds to be neuropathic type pain in regard to her left lower extremity. She has been using over-the-counter topical bins again which some believe. That in order to apply the she actually remove the wrap we put on her last  Wednesday on Thursday. Subsequently she has not had anything on compression wise since that time. The good news is a lot of the weeping areas appear to have closed at this point again I believe she would do better with compression but we are struggling to get her to actually use what she needs to at this point. No fevers, chills, nausea, or vomiting noted at this time. 09/03/17 on evaluation today patient appears to be doing okay in regard to her lower extremities in regard to the lymphedema and weeping. Fortunately she does not seem to show any signs of  infection at this point she does have a little bit of weeping occurring in the right medial malleolus area. With that being said this does not appear to be too significant which is good news. 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 She has severe skin damage secondary to both of these features involving the dorsal left foot and medial left ankle and lower leg. Still has open areas in the left anterior foot. The area on the right closed over. She uses her own juxta light stockings. She does not have an arterial issue 09/16/17 on evaluation today patient actually appears to be doing excellent in regard to her bilateral lower extremity swelling. The Juxta-Lite compression wrap seem to be doing very well for her. She has not however been using the portion that goes over her foot. Her left foot still is draining a little bit not nearly as significant as it has been in the past but still I do believe that she likely needs to utilize the full wrap including the foot portion of this will improve as well. She also has been apparently putting on a significant amount of Vaseline which also think is not helpful for her. I recommended that if she feels she needs something for moisturizer Eucerin will probably be better. 09/30/17 on evaluation today patient presents with several new open areas in regard to her left lower extremity although these appear to be minimal and mainly seem to be more moisture breakdown than anything. Fortunately she does not seem to have any evidence of infection which is great news. She has been tolerating the dressing changes without complication we are using silver alginate on the foot she has been using AB pads to have the legs and using her Juxta-Lite compression which seems to be controlling her swelling very well. Overall I'm pleased with the poor way she has progressed. 10/14/17 on evaluation today patient  appears to be doing better in regard to her left lower extremity areas of weeping. She does still have some discomfort although in general this does not appear to be as macerated and I think it is progressing nicely. I do think she still needs to wear the foot portion of her Juxta-Lite in order to get the most benefit from the wrap obviously. She states she understands. Fortunately there does not appear to be evidence of infection at this time which is great news. 10/28/17 on evaluation today patient appears to be doing excellent in regard to her left lower extremity. She has just a couple areas that are still open and seem to be causing any trouble whatsoever. For that reason I think that she is definitely headed in the right direction the spots are very tiny compared to what we have been dealing with in the past. 11/11/17 on evaluation today patient appears to have a right lateral lower extremity ulcer that  has opened since I last saw her. She states this is where the home health nurse that was coming out remove the dressing without wetting the alginate first. Nonetheless I do not know if this is indeed the case or not but more importantly we have not ordered home help to be coming out for her wounds at all. I'm unsure as to why they are coming out and we're gonna have to check on this and get things situated in that regard. With that being said we currently really do not need them to be coming out as the patient has been taking care of her leg herself without complication and no issues. In fact she was doing much better prior to nursing coming out. 11/25/17 on evaluation today patient actually appears to be doing fairly well in regard to her left lower extremity swelling. In fact she has very little area of weeping at this point there's just a small spot on the lateral portion of her right leg that still has me just a little bit more concerned as far as wanting to see this clear up before I  discharge her to caring for this at home. Nonetheless overall she has made excellent progress. 12/09/17 on evaluation today patient appears to be doing rather well in regard to her lower extremity edema. She does have some weeping still in the left lower extremity although the big area we were taking care of two weeks ago actually has closed and she has another area of weeping on the left lower extremity immediately as well is the top of her foot. She does not currently have lymphedema pumps she has been wearing her compression daily on a regular basis as directed. With that being said I think she may benefit from lymphedema pumps. 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 We had a very difficult time getting and keeping this under control. 12/23/17 on evaluation today patient actually appears to be doing a little bit more poorly in regard to her bilateral lower extremities. She has been tolerating the Juxta-Lite compression wraps. Unfortunately she has two new ulcers on the right lower extremity and left lower Trinity ulceration seems to be larger. Obviously this is not good news. She has been tolerating the dressings without complication. 12/30/17 on evaluation today patient actually appears to be doing much better in regard to her bilateral lower extremity edema. She continues to have some issues with ulcerations and in fact there appears to be one spot on each leg where the wrap may have caused a little bit of a blister which is subsequently opened up at this point is given her pain. Fortunately it does not appear to be any evidence of infection which is good news. No fevers chills noted. 01/13/18 on evaluation today patient appears to be doing rather well in regard to her bilateral lower extremities. The dressings did get kind of stuck as far  as the wound beds are concerned but again I think this is mainly due to the fact that she actually seems to be showing signs of healing which is good news. She's not having as much drainage therefore she was having more of the dressing sticking. Nonetheless overall I feel like her swelling is dramatically down compared to previous. 01/20/18 on evaluation today patient unfortunately though she's doing better in most regards has a  large blister on the left anterior lower extremity where she is draining quite significantly. Subsequently this is going to need debridement today in order to see what's underneath and ensure she does not continue to trapping fluid at this location. Nonetheless No fevers, chills, nausea, or vomiting noted at this time. 01/27/18 on evaluation today patient appears to be doing rather well at this point in regard to her right lower extremity there's just a very small area that she still has open at this point. With that being said I do believe that she is tolerating the compression wraps very well in making good progress. Home health is coming out at this point to see her. Her left lower extremity on the lateral portion is actually what still mainly open and causing her some discomfort for the most part 02/10/18 on evaluation today patient actually appears to be doing very well in regard to her right lower extremity were all the ulcers appear to be completely close. In regard to the left lower extremity she does have two areas still open and some leaking from the dorsal surface of her foot but this still seems to be doing much better to me in general. 02/24/18 on evaluation today patient actually appears to be doing much better in regard to her right lower extremity this is still completely healed. Her left lower extremity is also doing much better fortunately she has no evidence of infection. The one area that is gonna require some debridement is still on the left anterior shin.  Fortunately this is not hurting her as badly today. 03/10/18 on evaluation today patient appears to be doing better in some regards although she has a little bit more open area on the dorsal foot and she also has some issues on the medial portion of the left lower extremity which is actually new and somewhat deep. With that being said there fortunately does not appear to be any significant signs of infection which is good news. No fevers, chills, nausea, or vomiting noted at this time. In general her swelling seems to be doing fairly well which is good news. 03/31/18 on evaluation today patient presents for follow-up concerning her left lower extremity lymphedema. Unfortunately she has been doing a little bit more poorly since I last saw her in regard to the amount of weeping that she is experiencing. She's also having some increased pain in the anterior shin location. Unfortunately I do not feel like the patient is making such good progress at this point a few weeks back she was definitely doing much better. 04/07/18 on evaluation today patient actually appears to be showing some signs of improvement as far as the left lower extremity is concerned. She has been tolerating the dressing changes and it does appear that the Drawtex did better for her. With that being said unfortunately home health is stating that they cannot obtain the Drawtex going forward. Nonetheless we're gonna have to check and see what they may be able to get the alginate they were using was getting stuck in causing new areas of skin being pulled all that with and subsequently weep and calls her to worsen overall this is the first time we've seen improvement at this time. 04/14/18 on evaluation today patient actually appears to be doing rather well at this point there does not appear to be any evidence of infection at this time and she is actually doing excellent in regard to the weeping in fact she almost has no openings  remaining  even compared to just last week this is a dramatic improvement. No fevers chills noted 04/21/18 evaluation today patient actually appears to be doing very well. She in fact is has a small area on the posterior lower extremity location and she has a small area on the dorsal surface of her foot that are still open both of which are very close to closing. We're hoping this will be close shortly. She brought her Juxta-Lite wrap with her today hoping that would be able to put her in it unfortunately I don't think were quite at that point yet but we're getting closer. 04/28/18 upon evaluation today patient actually appears to be doing excellent in regard to her left lower extremity ulcer. In fact the region on the posterior lower extremity actually is much smaller than previously noted. Overall I'm very happy with the progress she has made. She again did bring her Juxta-Lite although we're not quite ready for that yet. 05/11/18 upon evaluation today patient actually appears to be doing in general fairly well in regard to her left lower Trinity. The swelling is very well controlled. With that being said she has a new area on the left anterior lower extremity as well as between the first and second toes of her left foot that was not present during the last evaluation. The region of her posterior left lower extremity actually appears to be almost completely healed. To be honest I'm very pleased with the way that stands. Nonetheless I do believe that the lotion may be keeping the area to moist as far as her legs are concerned subsequently I'm gonna consider discontinuing that today. 05/26/18 on evaluation today patient appears to be doing rather well in regard to her left lower should be ulcers. In fact everything appears to be close except for a very small area on the left posterior lower extremity. Fortunately there does not appear to be any evidence of infection at this time. Overall very  pleased with her progress. 06/02/18 and evaluation today patient actually appears to be doing very well in regard to her lower extremity ulcers. She has one small area that still continues to weep that I think may benefit her being able to justify lotion and user Juxta-Lite wraps versus continued to wrap her. Nonetheless I think this is something we can definitely look into at this point. 06/23/18 on evaluation today patient unfortunately has openings of her bilateral lower extremities. In general she seems to be doing much worse than when I last saw her just as far as her overall health standpoint is concerned. She states that her discomfort is mainly due to neuropathy she's not having any other issues otherwise. No fevers, chills, nausea, or vomiting noted at this time. 06/30/18 on evaluation today patient actually appears to be doing a little worse in regard to her right lower extremity her left lower extremity of doing fairly well. Fortunately there is no sign of infection at this time. She has been tolerating the dressing changes without complication. Home health did not come out like they were supposed to for the appropriate wrap changes. They stated that they never received the orders from [place] which were fax. Nonetheless we will send a copy of the orders with the patient today as well. 07/07/18 on evaluation today patient appears to be doing much better in regard to lower extremities. She still has several openings bilaterally although since I last saw her her legs did show obvious signs of infection when she later saw her  nurse. Subsequently a culture was obtained and she is been placed on Bactrim and Keflex. Fortunately things seem to be looking much better it does appear she likely had an infection. Again last week we'd even discussed it but again there really was not any obvious sign that she had infection therefore we held off on the antibiotics. Nonetheless I'm glad she's doing better  today. 07/14/18 on evaluation today patient appears to be doing much better regarding her bilateral lower Trinity's. In fact on the right lower for me there's nothing open at this point there are some dry skin areas at the sites where she had infection. Fortunately there is no evidence of systemic infection which is excellent news. No fevers chills noted 07/21/18 on evaluation today patient actually appears to be doing much better in regard to her left lower extremity ulcers. She is making good progress and overall I feel like she's improving each time I see her. She's having no pain I do feel like the infection is completely resolved which is excellent news. No fevers, chills, nausea, or vomiting noted at this time. 07/28/18 on evaluation today patient appears to be doing very well in regard to her left lower [employer]. Everything seems to be showing signs of improvement which is excellent news. Overall very pleased with the progress that has been made. Fortunately there's no evidence of active infection at this time also excellent news. 08/04/18 on evaluation today patient appears to be doing more poorly in regard to her bilateral lower extremities. She has two new areas open up on the right and these were completely closed as of last week. She still has the two spots on the left which in my pinion seem to be doing better. Fortunately there's no evidence of infection again at this point. 08/11/18 on evaluation today patient actually appears to be doing very well in regard to her bilateral lower Trinity wounds that all seem to be doing better and are measures smaller today. Fortunately there's no signs of infection. No fevers, chills, nausea, or vomiting noted at this time. 08/18/18 on evaluation today patient actually appears to be doing about the same inverter bilateral lower extremities. She continues to have areas that blistering open as was drain that fortunately nothing too significant.  Overall I feel like Drawtex may have done better for her however compared to the collagen. 08/25/18 on evaluation today patient appears to be doing a little bit more poorly today even compared to last time I saw her. Again I'm not exactly sure why she's making worse progress over the past several weeks. I'm beginning to wonder if there is some kind of underlying low level infection causing this issue. I did actually take a culture from the left anterior lower extremity but it was a new wound draining quite a bit at this point. Unfortunately she also seems to be having more pain which is what also makes me worried about the possibility of infection. This is despite never erythema noted at this point. 09/01/18 on evaluation today patient actually appears to be doing a little worse even compared to last week in regard to bilateral lower extremities. She did go to the hospital on the 16th was given a dose of IV Zosyn and then discharged with a recommendation to continue with the Bactrim that I previously prescribed for her. Nonetheless she is still having a lot of discomfort she tells me as well at this time. This is definitely unfortunate. No fevers, chills, nausea, or vomiting  noted at this time. 09/08/18 on evaluation today patient's bilateral lower extremities actually appear to be shown signs of improvement which is good news. Fortunately there does not appear to be any signs of active infection I think the anabiotic is helping in this regard. Overall I'm very pleased with how she is progressing. 09/15/18 patient was actually seen in ER yesterday due to her legs as well unfortunately. She states she's been having a lot of pain and discomfort as well as a lot of drainage. Upon inspection today the patient does have a lot of swelling and drainage I feel like this is more related to lymphedema and poor fluid control than it is to infection based on what I'm seeing. The physician in the emergency  department also doubted that the patient was having a significant infection nonetheless I see no evidence of infection obvious at this point although I do see evidence of poor fluid control. She still not using a compression pumps, she is not elevating due to her lift chair as well as her hospital bed being broken, and she really is not keeping her legs up as much as they should be and also has been taking off her wraps. All this combined I think has led to poor fluid control and to be honest she may be somewhat volume overloaded in general as well. I recommend that she may need to contact your physician to see if a prescription for a diuretic would be beneficial in their opinion. As long as this is safe I think it would likely help her. 09/29/18 on evaluation today patient's left lower extremity actually appears to be doing quite a bit better. At least compared to last time that I saw her. She still has a large area where she is draining from but there's a lot of new skin speckled trout and in fact there's more new skin that there are open areas of weeping and drainage at this point. This is good news. With regard to the right lower extremity this is doing much better with the only open area that I really see being a dry spot on the right lateral ankle currently. Fortunately there's no signs of active infection at this time which is good news. No fevers, chills, nausea, or vomiting noted at this time. The patient seems somewhat stressed and overwhelmed during the visit today she was very lethargic as such. She does and she is not taking any pain medications at this point. Apparently according to her husband are also in the process of moving which is probably taking its toll on her as well. 10/06/18 on evaluation today patient appears to be doing rather well in regard to her lower extremities compared to last evaluation. Fortunately there's no signs of active infection. She tells me she did have an  appointment with her primary. Nonetheless he was concerned that the wounds were somewhat deep based on pictures but we never actually saw her legs. She states that he had her somewhat worried due to the fact that she was fearing now that she was [place] have to have an amputation. With that being said based on what I'm seeing check she looks better this week that she has the last two times I've seen her with much less drainage I'm actually pleased in this regard. That doesn't mean that she's out of the water but again I do not think what the point of talking about education at all in regard to her leg. She is very happy  to hear this. She is also not having as much pain as she was having last week. 10/13/18 unfortunately on evaluation today patient still continues to have a significant amount of drainage she's not letting home health actually apply the compression dressings at this point. She's trying to use of Juxta-Lite of the top of Kerlex and the second layer of the three layer compression wrap. With that being said she just does not seem to be making as good a progress as I would expect if she was having the compression applied and in place on a regular basis. No fevers, chills, nausea, or vomiting noted at this time. 10/20/18 on evaluation today patient appears to be doing a little better in regard to her bilateral lower extremity ulcers. In fact the right lower extremity seems to be healed she doesn't even have any openings at this point left lower extremity though still somewhat macerated seems to be showing signs of new skin growth at multiple locations throughout. Fortunately there's no evidence of active infection at this time. No fevers, chills, nausea, or vomiting noted at this time. 10/27/18 on evaluation today patient appears to be doing much better in regard to her left lower Trinity ulcer. She's been tolerating the laptop complication and has minimal drainage noted at this point.  Fortunately there's no signs of active infection at this time. No fevers, chills, nausea, or vomiting noted at this time. 11/03/18 on evaluation today patient actually appears to be doing excellent in regard to her left lower extremity. She is having very little drainage at this point there does not appear to be any significant signs of infection overall very pleased with how things have gone. She is likewise extremely pleased still and seems to be making wonderful progress week to week. I do believe antibiotics were helpful for her. Her primary care provider did place on amateur clean since I last saw her. 11/17/18 on evaluation today patient appears to be doing worse in regard to her bilateral lower extremities at this point. She is been tolerating the dressing changes without complication. With that being said she typically takes the Coban off fairly quickly upon arriving home even after being seen here in the clinic and does not allow home health reapply command as part of the dressing at home. Therefore she said no compression essentially since I last saw her as best I can tell. With that being said I think it shows and how much swelling she has in the open wounds that are noted at this point. Fortunately there's no signs of infection but unfortunately if she doesn't get this under control I think she will end up with infection and more significant issues. 11/24/18 on evaluation today patient actually appears to be doing somewhat better in regard to her bilateral lower extremities. 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 Nonetheless that is doing better currently so she should be able to attempt reinitiating the lymphedema pumps at this time. No fevers, chills, nausea, or vomiting noted at this time. 12/01/18 upon evaluation today patient's left lower  extremity appears to be doing quite well unfortunately her right lower extremity is not doing nearly as well. She has been tolerating the dressing changes without complication unfortunately she did not keep a wrap on the right at this time. Nonetheless I believe  this has led to increased swelling and weeping in the world is actually much larger than during the last evaluation with her. 12/08/18 on evaluation today patient appears to be doing about the same at this point in regard to her right lower extremity. There is some more palatable to touch I'm concerned about the possibility of there being some infection although I think the main issue is she's not keeping her compression wrap on which in turn is not allowing this area to heal appropriately. 12/22/18 on evaluation today patient appears to be doing better in regard to left lower extremity unfortunately significantly worse in regard to the right lower extremity. The areas of blistering and necrotic superficial tissue have spread and again this does not really appear to be signs of infection and all she just doesn't seem to be doing nearly as well is what she has been in the past. Overall I feel like the Augmentin did absolutely nothing for her she doesn't seem to have any infection again I really didn't think so last time either is more of a potential preventative measure and hoping that this would make some difference but I think the main issue is she's not wearing her compression. She tells me she cannot wear the [place] been we put on she takes it off pretty much upon getting home. Subsequently she worshiped Juxta-Lite when I questioned her about how often she wears it this is no more than three hours a day obviously that leaves 21 hours that she has no compression and this is obviously not doing well for her. Overall I'm concerned that if things continue to worsen she is at great risk of both infection as well as losing her leg. 01/05/19 on  evaluation today patient appears to be doing well in regard to her left lower extremity which he is allowing [place] to wrap and not so well with regard to her right lower extremity which she is not allowing [place] to really wrap and keep the wrap on. She states that it hurts too badly whenever it's wrapped and she ends up having to take it off. She's been using the Juxta-Lite she tells me up to six hours a day although I question whether or not that's really been the case to be honest. Previously she told me three hours today nonetheless obviously the legs as long as the wrap is doing great when she is not is doing much more poorly. 01/12/2019 on evaluation today patient actually appears to be doing a little better in my opinion with regard to her right lower extremity ulcer. She has a small open area on the left lower extremity unfortunately but again this I think is part of the normal fluctuation of what she is going to have to expect with regard to her legs especially when she is not using her lymphedema pumps on a regular basis. Subsequently based on what I am seeing today I think that she does seem to be doing slightly better with regard to her right lower extremity she did see her primary care provider on Monday they felt she had an infection and placed her on 2 antibiotics. Both Cipro and clindamycin. Subsequently again she seems possibly to be doing a little bit better in regards to the right lower extremity she also tells me however she has been wearing the compression wrap over the past week since I spoke with her as well that is a Kerlix and Coban wrap on the right. No fevers, chills, nausea,  vomiting, or diarrhea. 01/19/2019 on evaluation today patient appears to be doing better with regard to her bilateral lower extremities especially the right. I feel like the compression has been beneficial for her which is great news. She did get a call from her primary care provider on her way here today  telling her that she did have methicillin-resistant Staphylococcus aureus and he was calling in a couple new antibiotics for her including a ointment to be applied she tells me 3 times a day. With that being said this sounds like likely to be Bactroban which I think could be applied with each dressing/wrap change but I would not be able to accommodate her applying this 3 times a day. She is in agreement with the least doing this we will add that to her orders today. 01/26/2019 on evaluation today patient actually appears to be doing much better with regard to her right lower extremity. Her left lower extremity is also doing quite well all things considering. Fortunately there is no evidence of active infection at this time. No fevers, chills, nausea, vomiting, or diarrhea. 02/02/2019 on evaluation today patient appears to be doing much better compared to her last evaluation. Little by little off like her right leg is returning more towards normal. There does not appear to be any signs of active infection and overall she seems to be doing quite well which is great news. I am very pleased in this regard. No fevers, chills, nausea, vomiting, or diarrhea. 02/09/2019 upon evaluation today patient appears to be doing better with regard to her bilateral lower extremities. She has been tolerating the dressing changes without complication. Fortunately there is no signs of active infection at this time. No fevers, chills, nausea, vomiting, or diarrhea. 02/23/2019 on evaluation today patient actually appears to be doing quite well with regard to her bilateral lower extremities. She has been tolerating the dressing changes without complication. She is even used her pumps one time and states that she really felt like it felt good. With that being said she seems to be in good spirits and her legs appear to be doing excellent. 03/09/2019 on evaluation today patient appears to be doing well with regard to her right  lower extremity there are no open wounds at this time she is having some discomfort but I feel like this is more neuropathy than anything. With regard to her left lower extremity she had several areas scattered around that she does have some weeping and drainage from but again overall she does not appear to be having any significant issues and no evidence of infection at this time which is good news. 03/23/2019 on evaluation today patient appears to be doing well with regard to her right lower extremity which she tells me is still close she is using her juxta light here. Her left lower extremity she mainly just has an area on the foot which is still slightly draining although this also is doing great. Overall very pleased at this time. 04/06/2019 patient appears to be doing a little bit worse in regard to her left lower extremity upon evaluation today. She feels like this could be becoming infected again which she had issues with previous. Fortunately there is no signs of systemic infection but again this is always a struggle with her with her legs she will go from doing well to not so well in a very short amount of time. 04/20/2019 on evaluation today patient actually appears to be doing quite well with regard to  her right lower extremity I do not see any signs of active infection at this time. Fortunately there is no fever chills noted. She is still taking the antibiotics which I prescribed for her at this point. In regard to the left lower extremity I do feel like some of these areas are better although again she still is having weeping from several locations at this time. 04/27/2019 on evaluation today patient appears to be doing about the same if not slightly worse in regard to her left lower extremity ulcers. She tells me when questioned that she has been sleeping in her Hoveround chair in fact she tells me she falls asleep without even knowing it. I think she is spending a whole lot of time in  the chair and less time walking and moving around which is not good for her legs either. On top of that she is in a seated position which is also the worst position she is not really elevating her legs and she is also not using her lymphedema pumps. All this is good to contribute to worsening of her condition in general. 05/18/2019 on evaluation today patient appears to be doing well with regard to her lower extremity on the right in fact this is showing no signs of any open wounds at this time. On the left she is continuing to have issues with areas that do drain. Some of the regions have healed and there are couple areas that have reopened. She did go to the ER per the patient according to recommendations from the home health nurse due to what she was seen when she came out on 05/13/2019. Subsequently she felt like the patient needed to go to the hospital due to the fact that again she was having "milky white discharge" from her leg. Nonetheless she had and then was placed on doxycycline and subsequently seems to be doing better. 06/01/2019 upon evaluation today patient appears to be doing really in my opinion about the same. I do not see any signs of active infection which is good news. Overall she still has wounds over the bilateral lower extremities she has reopened on the right but this appears to be more of a crack where there is weeping/edema coming from the region. I do not see any evidence of infection at either site based on what I visualized today. 07/13/2019 upon evaluation today patient appears to be doing a little worse compared to last time I saw her. She since has been in the hospital from 06/21/2019 through 06/29/2019. This was secondary to having Covid. During that time they did apply lotion to her legs which unfortunately has caused her to develop a myriad of open wounds on her lower extremities. Her legs do appear to be doing better as far as the overall appearance is concerned  but nonetheless she does have more open and weeping areas. 07/27/2019 upon evaluation today patient appears to be doing more poorly to be honest in regard to her left lower extremity in particular. There is no signs of systemic infection although I do believe she may have local infection. She notes she has been having a lot of blue/green drainage which is consistent potentially with Pseudomonas. That may be something that we need to consider here as well. The doxycycline does not seem to have been helping. 08/03/2019 upon evaluation today patient appears to be doing a little better in my opinion compared to last week's evaluation. Her culture I did review today and she is on appropriate  medications to help treat the Enterobacter that was noted. Overall I feel like that is good news. With that being said she is unfortunately continuing to have a lot of drainage and though it is doing better I still think she has a long ways to go to get things dried up in general. Fortunately there is no signs of systemic infection. 08/10/2019 upon evaluation today patient appears to be doing may be slightly better in regard to her left lower extremity the right lower extremity is doing much better. Fortunately there is no signs of infection right now which is good news. No fevers, chills, nausea, vomiting, or diarrhea. 08/24/2019 on evaluation today patient appears to be doing slightly better in regard to her lower extremities. The left lower extremity seems to be healed the right lower extremity is doing better though not completely healed as far as the openings are concerned. She has some generalized issues here with edema and weeping secondary to her lymphedema though again I do believe this is little bit drier compared to prior weeks evaluations. In general I am very pleased with how things seem to be progressing. No fevers, chills, nausea, vomiting, or diarrhea. 08/31/2019 upon evaluation today patient actually  seems to making some progress here with regard to the left lower extremity in particular. She has been tolerating the dressing changes without complication. Fortunately there is no signs of active infection at this time. No fevers, chills, nausea, vomiting, or diarrhea. She did see Dr. Doren Custard and he did note that she did have a issue with the left great saphenous vein and the small saphenous vein in the leg. With that being said he was concerned about the possibility of laser ablation not being extremely successful. He also mentioned a small risk of DVT associated with the procedure. However if the wounds do not continue to improve he stated that that would probably be the way to go. Fortunately the patient's legs do seem to be doing much better. Objective Constitutional Well-nourished and well-hydrated in no acute distress. Vitals Time Taken: 11:27 AM, Height: 62 in, Weight: 335 lbs, BMI: 61.3, Temperature: 98.6 F, Pulse: 85 bpm, Respiratory Rate: 18 breaths/min, Blood Pressure: 131/65 mmHg. Respiratory normal breathing without difficulty. Psychiatric this patient is able to make decisions and demonstrates good insight into disease process. Alert and Oriented x 3. pleasant and cooperative. General Notes: Patient's wound bed currently showed signs of good granulation and epithelization at this point. Again there is much more new skin growth compared to previous which is great news overall I am extremely pleased with how things seem to be progressing. Integumentary (Hair, Skin) Wound #61 status is Open. Original cause of wound was Gradually Appeared. The wound is located on the Left,Circumferential Lower Leg. The wound measures 19cm length x 30.9cm width x 0.2cm depth; 461.107cm^2 area and 92.221cm^3 volume. There is Fat Layer (Subcutaneous Tissue) Exposed exposed. There is no tunneling or undermining noted. There is a large amount of serous drainage noted. The wound margin is flat and  intact. There is no granulation within the wound bed. There is a large (67-100%) amount of necrotic tissue within the wound bed including Adherent Slough. Wound #64 status is Open. Original cause of wound was Gradually Appeared. The wound is located on the Left,Dorsal Foot. The wound measures 7.5cm length x 7cm width x 0.1cm depth; 41.233cm^2 area and 4.123cm^3 volume. There is Fat Layer (Subcutaneous Tissue) Exposed exposed. There is no tunneling or undermining noted. There is a small amount of  serous drainage noted. The wound margin is flat and intact. There is medium (34-66%) pink, pale granulation within the wound bed. There is a medium (34-66%) amount of necrotic tissue within the wound bed including Adherent Slough. 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 There was a Three Layer Compression Therapy Procedure by Carlene Coria, RN. Post procedure Diagnosis Wound #61: Same as Pre-Procedure Plan Follow-up Appointments: Return Appointment in 1 week. Dressing Change Frequency: Wound #61 Left,Circumferential Lower Leg: Change dressing three times week. Other: - may remove wrap from right leg and wear juxtalite Wound #64 Left,Dorsal  Foot: Change dressing three times week. Other: - may remove wrap from right leg and wear juxtalite Skin Barriers/Peri-Wound Care: Barrier cream - zinc oxide cream to any macerated areas Moisturizing lotion - to right leg daily Wound Cleansing: Clean wound with Wound Cleanser - all wounds May shower with protection. Primary Wound Dressing: Wound #61 Left,Circumferential Lower Leg: Calcium Alginate with Silver Wound #64 Left,Dorsal Foot: Calcium Alginate with Silver Secondary Dressing: Wound #61 Left,Circumferential Lower Leg: Dry Gauze - all wounds ABD pad - as needed Zetuvit or Kerramax - as needed Wound #64 Left,Dorsal Foot: Dry Gauze - all wounds ABD pad - as needed Edema Control: 3 Layer Compression System - Left Lower Extremity Avoid standing for long periods of time - walking is encouraged Elevate legs to the level of the heart or above for 30 minutes daily and/or when sitting, a frequency of: - do not sleep in chair with feet dangling, MUST elevate legs while sitting Exercise regularly Support Garment 20-30 mm/Hg pressure to: - Juxtalite to right lower leg daily Segmental Compressive Device. - lymphedema pumps 60 minutes 1- 2 times per day Off-Loading: Turn and reposition every 2 hours Additional Orders / Instructions: Follow Nutritious Diet - To include vitamin A, vitamin C, and Zinc along with increased protein intake. Home Health: [facility] for [facility]. - [facility]. I would recommend at this point that we go ahead and continue with the current wound care measures specifically with regard to the Desitin applied to the leg and then following this more than to be using silver alginate over the open areas. 2. I am also can recommend that we continue with a 3 layer compression wrap of the left lower extremity. On the right lower extremity again the patient use her own Velcro compression wrap. 3. I do still recommend that she needs to  elevate her legs is much as possible. We will see patient back for reevaluation in 1 week here in the clinic. If anything worsens or changes patient will contact our office for additional recommendations. Electronic Signature(s) Signed: 08/31/2019 1:01:03 PM By: [name] PA-C Entered By: [name] on 08/31/2019 13:01:02 -------------------------------------------------------------------------------- SuperBill Details Patient Name: Date of Service: [name] 08/31/2019 Medical Record [id number] Patient Account Number: [id number]  Date of Birth/Sex: Treating RN: Oct 18, 1950 (69 y.o. [name] Primary Care Provider: [name] Other Clinician: Referring Provider: Treating Provider/Extender:Stone III, [name], FRED Weeks in Treatment: 108 Diagnosis Coding ICD-10 Codes Code Description E11.622 Type 2 diabetes mellitus with other skin ulcer I89.0 Lymphedema, not elsewhere classified I87.331 Chronic venous hypertension (idiopathic) with ulcer and inflammation of right lower extremity I87.332 Chronic venous hypertension (idiopathic) with ulcer and inflammation of left lower extremity L97.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 leg with fat layer exposed L97.822 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 I10 Essential (primary) hypertension E66.01 Morbid (severe) obesity due to excess calories F41.8 Other specified anxiety disorders R53.1 Weakness Facility Procedures The patient participates with Medicare or their insurance follows the Medicare Facility Guidelines: CPT4 Code Description Modifier Quantity [phone] (Facility Use Only) [phone] - [place] [facility] Physician Procedures CPT4 Code Description: [id number] - WC PHYS LEVEL 3 - EST PT ICD-10 Diagnosis Description E11.622 Type 2 diabetes  mellitus with other skin ulcer I89.0 Lymphedema, not elsewhere classified I87.331 Chronic venous hypertension (idiopathic) with ulcer and  lower extremity I87.332 Chronic venous hypertension (idiopathic) with ulcer and extremity Modifier: inflammation of inflammation of Quantity: 1 right left lower Electronic Signature(s) Signed: 08/31/2019 1:01:18 PM By: [name] PA-C Entered By: [name] on 08/31/2019 13:01:17

## 2019-09-07 ENCOUNTER — Encounter (HOSPITAL_BASED_OUTPATIENT_CLINIC_OR_DEPARTMENT_OTHER): Payer: Medicare PPO | Admitting: Physician Assistant

## 2019-09-07 ENCOUNTER — Other Ambulatory Visit: Payer: Self-pay

## 2019-09-07 DIAGNOSIS — E11622 Type 2 diabetes mellitus with other skin ulcer: Secondary | ICD-10-CM | POA: Diagnosis not present

## 2019-09-07 NOTE — Progress Notes (Addendum)
[name], BEHRLE ([id number]) Visit Report for 09/07/2019 Chief Complaint Document Details Patient Name: Date of Service: [name], [name] 09/07/2019 9:30 AM Medical Record [id number] Patient Account Number: [id number] Date of Birth/Sex: Treating RN: [birth year] (69 y.o. [name] Primary Care Provider: [name] Other Clinician: Referring Provider: Treating Provider/Extender:Stone III, [name], FRED Weeks in Treatment: 109 Information Obtained from: Patient Chief Complaint Bilateral reoccurring LE ulcers Electronic Signature(s) Signed: 09/07/2019 9:51:32 AM By: [name] PA-C Entered By: [name] on 09/07/2019 09:51:32 -------------------------------------------------------------------------------- Debridement Details Patient Name: Date of Service: [name]. 09/07/2019 9:30 AM Medical Record [id number] Patient Account Number: [id number] Date of Birth/Sex: Treating RN: [birth year] (68 y.o. [name] Primary Care Provider: [name] Other Clinician: Referring Provider: Treating Provider/Extender:Stone III, [name], FRED Weeks in Treatment: 109 Debridement Performed for Wound #61 Left,Circumferential Lower Leg Assessment: Performed By: Physician [name], PA Debridement Type: Debridement Severity of Tissue Pre Fat layer exposed Debridement: Level of Consciousness (Pre- Awake and Alert procedure): Pre-procedure Verification/Time Out Taken: Yes - 10:30 Start Time: 10:30 Pain Control: Other : benzocaine 20% spray Total Area Debrided (L x W): 5 (cm) x 4 (cm) = 20 (cm) Tissue and other material Viable, Non-Viable, Slough, Subcutaneous, Skin: Epidermis, Fibrin/Exudate, Slough debrided: Level: Skin/Subcutaneous Tissue Debridement Description: Excisional Instrument: Curette Bleeding: Minimum Hemostasis Achieved: Pressure End Time: 10:38 Procedural Pain: 2 Post Procedural Pain: 0 Response to Treatment:  Procedure was tolerated well Level of Consciousness Awake and Alert (Post-procedure): Post Debridement Measurements of Total Wound Length: (cm) 18 Width: (cm) 28 Depth: (cm) 0.1 Volume: (cm) 39.584 Character of Wound/Ulcer Post Improved Debridement: Severity of Tissue Post Debridement: Fat layer exposed Post Procedure Diagnosis Same as Pre-procedure Electronic Signature(s) Signed: 09/07/2019 5:14:16 PM By: [name] PA-C Signed: 09/07/2019 5:36:50 PM By: [name] RN, BSN Entered By: [name] on 09/07/2019 10:36:17 -------------------------------------------------------------------------------- HPI Details Patient Name: Date of Service: [name]. 09/07/2019 9:30 AM Medical Record [id number] Patient Account Number: [id number] Date of Birth/Sex: Treating RN: [birth year] (69 y.o. [name] Primary Care Provider: [name] Other Clinician: Referring Provider: Treating Provider/Extender:Stone III, [name], FRED Weeks in Treatment: 109 History of Present Illness HPI Description: this patient has been seen a couple of times before and returns with recurrent problems to her right and left lower extremity with swelling and weeping ulcerations due to not wearing her compression stockings which she had been advised to do during her last discharge, at the end of June 2018. During her last visit the patient had had normal arterial blood flow and her venous reflux study did not necessitate any surgical intervention. She was recommended compression and elevation and wound care. After prolonged treatment the patient was completely healed but she has been noncompliant with wearing or compressions.. She was here last week with an outpatient return visit planned but the patient came in a very poor general condition with altered mental status and was rushed to the ER on my request. With a history of hypertension, diabetes, TIA and right-sided weakness  she was set up for an MRI on her brain and cervical spine and was sent to [facility]. Getting an MRI done was very difficult but once the workup was done she was found not to have any spinal stenosis, epidural abscess or hematoma or discitis. This was radiculopathy to be treated as an outpatient and she was given a follow-up appointment. Today she is feeling much better alert and oriented and has come to  reevaluate her bilateral lower extremity lymphedema and ulceration 03/25/2017 -- she was admitted to the hospital on 03/16/2017 and discharged on 03/18/2017 with left leg cellulitis and ulceration. She was started on vancomycin and Zosyn and x-ray showed no bony involvement. She was treated for a cellulitis with IV antibiotics changed to Rocephin and Flagyl and was discharged on oral Keflex and doxycycline to complete a 7 day course. Last hemoglobin A1c was 7.1 and her other ailments including hypertension got asthma were appropriately treated. 05/06/2017 -- she is awaiting the right size of compression stockings from [name] but other than that has been doing well. ====== Old notes 69 year old patient was seen one time last October and was lost to follow-up. She has recurrent problems with weeping and ulceration of her left lower extremity and has swelling of this for several years. It has been worse for the last 2 months. Past medical history is significant for diabetes mellitus type 2, hypertension, gout, morbid obesity, depressive disorders, hiatal hernia, migraines, status post knee surgery, risk of a cholecystectomy, vaginal hysterectomy and breast biopsy. She is not a smoker. As noted before she has never had a venous duplex study and an arterial ABI study was attempted but the left lower extremity was noncompressible 10/01/2016 -- had a lower extremity venous duplex reflux evaluation which showed no evidence of deep vein reflux in the right or left lower extremity, and no evidence of  great saphenous vein reflux more than 500 ms in the right or left lower extremity, and the left small saphenous vein is incompetent but no vascular consult was recommended. review of her electronic medical records noted that the ABI was checked in July 2017 where the right ABI was normal limits and the left ABI could not be ascertained due to pain with cuff pressure but the waveforms are within normal limits. her arterial duplex study scheduled for April 27. 10/08/2016 -- the patient has various reasons for not having a compression on and for the last 3 days she has had no compression on her left lower extremity either due to pain or the lack of nursing help. She does not use her juxta lites either. 10/15/2016 -- the patient did not keep her appointment for arterial duplex study on April 27 and I have asked her to reschedule this. Her pain is out of proportion with the physical findings and she continuously fails to wear a compression wraps and cuts them off because she says she cannot tolerate the pain. She does not use her juxta lites either. 10/22/2016 -- he has rescheduled her arterial duplex study to May 21 and her pain today is a bit better. She has not been wearing her juxta lites on her right lower extremity but now understands that she needs to do this. She did tolerate the to press compression wrap on her left lower extremity 10/29/2016 --arterial duplex study is scheduled for next week and overall she has been tolerating her compression wraps and also using her juxta lites on her right lower extremity 11/05/2016 -- the right ABI was 0.95 the left was 1.03. The digit TBI is on the right was 0.83 on the left was 0.92 and she had biphasic flow through these vessels. The impression was that of normal lower extremity arterial study. 11/12/2016 -- her pain is minimal and she is doing very well overall. 11/26/2016 -- she has got juxta lites and her insurance will not pay for additional dual  layer compression stockings. She is going to order some  from [place]. 05/12/2017 -- her juxta lites are very old and too big for her and these have not been helping with compression. She did get 20-30 mm compression stockings from [place] but she and her husband are unable to put these on. I believe she will benefit from bilateral Extremit-ease, compression stockings and we will measure her for these today. 05/20/2017 -- lymphedema on the left lower extremity has increased a lot and she has a open ulceration as a result of this. The right lower extremity is looking pretty good. She has decided to by the compression stockings herself and will get reimbursed by the home health, at a later date. 05/27/2017 -- her sciatica is bothering her a lot and she thought her left leg pain was caused due to the compression wrap and hence removed it and has significant lymphedema. There is no inflammation on this left lower extremity. 06/17/17 on evaluation today patient appears to be doing very well and in fact is completely healed in regard to her ulcerations. Unfortunately however she does have continued issues with lymphedema nonetheless. We did order compression garments for her unfortunately she states that the size that she received were large although we ordered medium. Obviously this means she is not getting the optimal compression. She does not have those with her today and therefore we could not confirm and contact the company on her behalf. Nonetheless she does state that she is going to have her husband bring them by tomorrow so that we can verify and then get in touch with the company. No fevers, chills, nausea, or vomiting noted at this time. Overall patient is doing better otherwise and I'm pleased with the progress she has made. 07/01/17 on evaluation today patient appears to be doing very well in regard to her bilateral lower extremity she does not have any openings at this point which is  excellent news. Overall I'm pleased with how things have progressed up to this time. Since she is doing so well we did order her compression which we are seeing her today to ensure that it fits her properly and everything is doing well in that regard and then subsequently she will be discharged. ============ Old Notes: 03/31/16 patient presents today for evaluation concerning open wounds that she has over the left medial ankle region as well as the left dorsal foot. She has previously had this occur although it has been healed for a number of months after having this for about a year prior until her hospitalization on 01/05/16. At that point in time it appears that she was admitted to the hospital for left lower extremity cellulitis and was placed on vancomycin and Zosyn at that point. Eventually upon discharge on January 15, 2016 she was placed on doxycycline at that point in time. Later on 03/27/16 positive wound culture growing Escherichia coli this was switched to amoxicillin. Currently she tells me that she is having pain radiated to be a 7 out of 10 which can be as high as 10 out of 10 with palpation and manipulation of the wound. This wound appears to be mainly venous in nature due to the bilateral lower extremity venous stasis/lymphedema. This is definitely much worse on her left than the right side. She does have type 1 diabetes mellitus, hypertension, morbid obesity, and is wheelchair dependent.during the course of the hospital stay a blood culture was also obtained and fortunately appeared negative. She also had an x-ray of the tibia/fibula on the left which showed no acute  bone abnormality. Her white blood cell count which was performed last on 03/25/16 was 7.3, hemoglobin 12.8, protein 7.1, albumin 3.0. Her urine culture appeared to be negative for any specific organisms. Patient did have a left lower extremity venous duplex evaluation for DVT. This did not include venous reflux  studies but fortunately was negative for DVT. Patient also had arterial studies performed which revealed that she had a normal ABI on the right though this was unable to be performed on the left secondary to pain that she was having around the ankle region due to the wound. However it was stated on report that she had biphasic pulses and apparently good blood flow. ========== 06/03/17 she is here in follow-up evaluation for right lower extremity ulcer. The right lower sure he has healed but she has reopened to the left medial malleolus and dorsal foot with weeping. She is waiting for new compression garments to arrive from home health, the previous compression garments were ill fitting. We will continue with compression bilaterally and follow-up in 2 weeks Readmission: 08/05/17 on evaluation today patient appears to be doing somewhat poorly in regard to her left lower extremity especially although the right lower extremity has a small area which may no longer be open. She has been having a lot of drainage from the left lower extremity however he tells me that she has not been able to use the EXTREMIT- EASE Compression at this point. She states that she did better and was able to actually apply the Juxta-Lite compression although the wound that she has is too large and therefore really does not compress which is why she cannot wear it at this point. She has no one who can help her put it on regular basis her son can sometimes but he's not able to do it most of the time. I do believe that's why she has begun to weave and have issues as she is currently yet again. No fevers, chills, nausea, or vomiting noted at this time. Patient is no evidence of dementia. 08/12/17 on evaluation today patient appears to still be doing fairly well in regard to the draining areas/weeping areas at this point. With that being said she unfortunately did go to the ER yesterday due to what was felt to be possibly a  cellulitis. They place her on doxycycline by mouth and discharge her home. She definitely was not admitted. With that being said she states she has had more discomfort which has been unusual for her even compared to prior times and she's had infections.08/12/17 on evaluation today patient appears to still be doing fairly well in regard to the draining areas/weeping areas at this point. With that being said she unfortunately did go to the ER yesterday due to what was felt to be possibly a cellulitis. They place her on doxycycline by mouth and discharge her home. She definitely was not admitted. With that being said she states she has had more discomfort which has been unusual for her even compared to prior times and she's had infections. 08/19/17 put evaluation today patient tells me that she's been having a lot of what sounds to be neuropathic type pain in regard to her left lower extremity. She has been using over-the-counter topical bins again which some believe. That in order to apply the she actually remove the wrap we put on her last Wednesday on Thursday. Subsequently she has not had anything on compression wise since that time. The good news is a lot of the  weeping areas appear to have closed at this point again I believe she would do better with compression but we are struggling to get her to actually use what she needs to at this point. No fevers, chills, nausea, or vomiting noted at this time. 09/03/17 on evaluation today patient appears to be doing okay in regard to her lower extremities in regard to the lymphedema and weeping. Fortunately she does not seem to show any signs of infection at this point she does have a little bit of weeping occurring in the right medial malleolus area. With that being said this does not appear to be too significant which is good news. 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 She has severe  skin damage secondary to both of these features involving the dorsal left foot and medial left ankle and lower leg. Still has open areas in the left anterior foot. The area on the right closed over. She uses her own juxta light stockings. She does not have an arterial issue 09/16/17 on evaluation today patient actually appears to be doing excellent in regard to her bilateral lower extremity swelling. The Juxta-Lite compression wrap seem to be doing very well for her. She has not however been using the portion that goes over her foot. Her left foot still is draining a little bit not nearly as significant as it has been in the past but still I do believe that she likely needs to utilize the full wrap including the foot portion of this will improve as well. She also has been apparently putting on a significant amount of Vaseline which also think is not helpful for her. I recommended that if she feels she needs something for moisturizer Eucerin will probably be better. 09/30/17 on evaluation today patient presents with several new open areas in regard to her left lower extremity although these appear to be minimal and mainly seem to be more moisture breakdown than anything. Fortunately she does not seem to have any evidence of infection which is great news. She has been tolerating the dressing changes without complication we are using silver alginate on the foot she has been using AB pads to have the legs and using her Juxta-Lite compression which seems to be controlling her swelling very well. Overall I'm pleased with the poor way she has progressed. 10/14/17 on evaluation today patient appears to be doing better in regard to her left lower extremity areas of weeping. She does still have some discomfort although in general this does not appear to be as macerated and I think it is progressing nicely. I do think she still needs to wear the foot portion of her Juxta-Lite in order to get the most benefit from  the wrap obviously. She states she understands. Fortunately there does not appear to be evidence of infection at this time which is great news. 10/28/17 on evaluation today patient appears to be doing excellent in regard to her left lower extremity. She has just a couple areas that are still open and seem to be causing any trouble whatsoever. For that reason I think that she is definitely headed in the right direction the spots are very tiny compared to what we have been dealing with in the past. 11/11/17 on evaluation today patient appears to have a right lateral lower extremity ulcer that has opened since I last saw her. She states this is where the home health nurse that was coming out remove the dressing  without wetting the alginate first. Nonetheless I do not know if this is indeed the case or not but more importantly we have not ordered home help to be coming out for her wounds at all. I'm unsure as to why they are coming out and we're gonna have to check on this and get things situated in that regard. With that being said we currently really do not need them to be coming out as the patient has been taking care of her leg herself without complication and no issues. In fact she was doing much better prior to nursing coming out. 11/25/17 on evaluation today patient actually appears to be doing fairly well in regard to her left lower extremity swelling. In fact she has very little area of weeping at this point there's just a small spot on the lateral portion of her right leg that still has me just a little bit more concerned as far as wanting to see this clear up before I discharge her to caring for this at home. Nonetheless overall she has made excellent progress. 12/09/17 on evaluation today patient appears to be doing rather well in regard to her lower extremity edema. She does have some weeping still in the left lower extremity although the big area we were taking care of two weeks ago actually  has closed and she has another area of weeping on the left lower extremity immediately as well is the top of her foot. She does not currently have lymphedema pumps she has been wearing her compression daily on a regular basis as directed. With that being said I think she may benefit from lymphedema pumps. 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 We had a very difficult time getting and keeping this under control. 12/23/17 on evaluation today patient actually appears to be doing a little bit more poorly in regard to her bilateral lower extremities. She has been tolerating the Juxta-Lite compression wraps. Unfortunately she has two new ulcers on the right lower extremity and left lower Trinity ulceration seems to be larger. Obviously this is not good news. She has been tolerating the dressings without complication. 12/30/17 on evaluation today patient actually appears to be doing much better in regard to her bilateral lower extremity edema. She continues to have some issues with ulcerations and in fact there appears to be one spot on each leg where the wrap may have caused a little bit of a blister which is subsequently opened up at this point is given her pain. Fortunately it does not appear to be any evidence of infection which is good news. No fevers chills noted. 01/13/18 on evaluation today patient appears to be doing rather well in regard to her bilateral lower extremities. The dressings did get kind of stuck as far as the wound beds are concerned but again I think this is mainly due to the fact that she actually seems to be showing signs of healing which is good news. She's not having as much drainage therefore she was having more of the dressing sticking. Nonetheless overall I feel like her swelling is dramatically down compared to  previous. 01/20/18 on evaluation today patient unfortunately though she's doing better in most regards has a large blister on the left anterior lower extremity where she is draining quite significantly. Subsequently this is going to need debridement today in  order to see what's underneath and ensure she does not continue to trapping fluid at this location. Nonetheless No fevers, chills, nausea, or vomiting noted at this time. 01/27/18 on evaluation today patient appears to be doing rather well at this point in regard to her right lower extremity there's just a very small area that she still has open at this point. With that being said I do believe that she is tolerating the compression wraps very well in making good progress. Home health is coming out at this point to see her. Her left lower extremity on the lateral portion is actually what still mainly open and causing her some discomfort for the most part 02/10/18 on evaluation today patient actually appears to be doing very well in regard to her right lower extremity were all the ulcers appear to be completely close. In regard to the left lower extremity she does have two areas still open and some leaking from the dorsal surface of her foot but this still seems to be doing much better to me in general. 02/24/18 on evaluation today patient actually appears to be doing much better in regard to her right lower extremity this is still completely healed. Her left lower extremity is also doing much better fortunately she has no evidence of infection. The one area that is gonna require some debridement is still on the left anterior shin. Fortunately this is not hurting her as badly today. 03/10/18 on evaluation today patient appears to be doing better in some regards although she has a little bit more open area on the dorsal foot and she also has some issues on the medial portion of the left lower extremity which is actually new and somewhat deep. With that  being said there fortunately does not appear to be any significant signs of infection which is good news. No fevers, chills, nausea, or vomiting noted at this time. In general her swelling seems to be doing fairly well which is good news. 03/31/18 on evaluation today patient presents for follow-up concerning her left lower extremity lymphedema. Unfortunately she has been doing a little bit more poorly since I last saw her in regard to the amount of weeping that she is experiencing. She's also having some increased pain in the anterior shin location. Unfortunately I do not feel like the patient is making such good progress at this point a few weeks back she was definitely doing much better. 04/07/18 on evaluation today patient actually appears to be showing some signs of improvement as far as the left lower extremity is concerned. She has been tolerating the dressing changes and it does appear that the Drawtex did better for her. With that being said unfortunately home health is stating that they cannot obtain the Drawtex going forward. Nonetheless we're gonna have to check and see what they may be able to get the alginate they were using was getting stuck in causing new areas of skin being pulled all that with and subsequently weep and calls her to worsen overall this is the first time we've seen improvement at this time. 04/14/18 on evaluation today patient actually appears to be doing rather well at this point there does not appear to be any evidence of infection at this time and she is actually doing excellent in regard to the weeping in fact she almost has no openings remaining even compared to just last week this is a dramatic improvement. No fevers chills noted 04/21/18 evaluation today patient actually appears to be doing  very well. She in fact is has a small area on the posterior lower extremity location and she has a small area on the dorsal surface of her foot that are still open both of  which are very close to closing. We're hoping this will be close shortly. She brought her Juxta-Lite wrap with her today hoping that would be able to put her in it unfortunately I don't think were quite at that point yet but we're getting closer. 04/28/18 upon evaluation today patient actually appears to be doing excellent in regard to her left lower extremity ulcer. In fact the region on the posterior lower extremity actually is much smaller than previously noted. Overall I'm very happy with the progress she has made. She again did bring her Juxta-Lite although we're not quite ready for that yet. 05/11/18 upon evaluation today patient actually appears to be doing in general fairly well in regard to her left lower Trinity. The swelling is very well controlled. With that being said she has a new area on the left anterior lower extremity as well as between the first and second toes of her left foot that was not present during the last evaluation. The region of her posterior left lower extremity actually appears to be almost completely healed. To be honest I'm very pleased with the way that stands. Nonetheless I do believe that the lotion may be keeping the area to moist as far as her legs are concerned subsequently I'm gonna consider discontinuing that today. 05/26/18 on evaluation today patient appears to be doing rather well in regard to her left lower should be ulcers. In fact everything appears to be close except for a very small area on the left posterior lower extremity. Fortunately there does not appear to be any evidence of infection at this time. Overall very pleased with her progress. 06/02/18 and evaluation today patient actually appears to be doing very well in regard to her lower extremity ulcers. She has one small area that still continues to weep that I think may benefit her being able to justify lotion and user Juxta-Lite wraps versus continued to wrap her. Nonetheless I think this is  something we can definitely look into at this point. 06/23/18 on evaluation today patient unfortunately has openings of her bilateral lower extremities. In general she seems to be doing much worse than when I last saw her just as far as her overall health standpoint is concerned. She states that her discomfort is mainly due to neuropathy she's not having any other issues otherwise. No fevers, chills, nausea, or vomiting noted at this time. 06/30/18 on evaluation today patient actually appears to be doing a little worse in regard to her right lower extremity her left lower extremity of doing fairly well. Fortunately there is no sign of infection at this time. She has been tolerating the dressing changes without complication. Home health did not come out like they were supposed to for the appropriate wrap changes. They stated that they never received the orders from [place] which were fax. Nonetheless we will send a copy of the orders with the patient today as well. 07/07/18 on evaluation today patient appears to be doing much better in regard to lower extremities. She still has several openings bilaterally although since I last saw her her legs did show obvious signs of infection when she later saw her nurse. Subsequently a culture was obtained and she is been placed on Bactrim and Keflex. Fortunately things seem to be looking much better  it does appear she likely had an infection. Again last week we'd even discussed it but again there really was not any obvious sign that she had infection therefore we held off on the antibiotics. Nonetheless I'm glad she's doing better today. 07/14/18 on evaluation today patient appears to be doing much better regarding her bilateral lower Trinity's. In fact on the right lower for me there's nothing open at this point there are some dry skin areas at the sites where she had infection. Fortunately there is no evidence of systemic infection which is excellent news. No fevers  chills noted 07/21/18 on evaluation today patient actually appears to be doing much better in regard to her left lower extremity ulcers. She is making good progress and overall I feel like she's improving each time I see her. She's having no pain I do feel like the infection is completely resolved which is excellent news. No fevers, chills, nausea, or vomiting noted at this time. 07/28/18 on evaluation today patient appears to be doing very well in regard to her left lower [employer]. Everything seems to be showing signs of improvement which is excellent news. Overall very pleased with the progress that has been made. Fortunately there's no evidence of active infection at this time also excellent news. 08/04/18 on evaluation today patient appears to be doing more poorly in regard to her bilateral lower extremities. She has two new areas open up on the right and these were completely closed as of last week. She still has the two spots on the left which in my pinion seem to be doing better. Fortunately there's no evidence of infection again at this point. 08/11/18 on evaluation today patient actually appears to be doing very well in regard to her bilateral lower Trinity wounds that all seem to be doing better and are measures smaller today. Fortunately there's no signs of infection. No fevers, chills, nausea, or vomiting noted at this time. 08/18/18 on evaluation today patient actually appears to be doing about the same inverter bilateral lower extremities. She continues to have areas that blistering open as was drain that fortunately nothing too significant. Overall I feel like Drawtex may have done better for her however compared to the collagen. 08/25/18 on evaluation today patient appears to be doing a little bit more poorly today even compared to last time I saw her. Again I'm not exactly sure why she's making worse progress over the past several weeks. I'm beginning to wonder if there is some  kind of underlying low level infection causing this issue. I did actually take a culture from the left anterior lower extremity but it was a new wound draining quite a bit at this point. Unfortunately she also seems to be having more pain which is what also makes me worried about the possibility of infection. This is despite never erythema noted at this point. 09/01/18 on evaluation today patient actually appears to be doing a little worse even compared to last week in regard to bilateral lower extremities. She did go to the hospital on the 16th was given a dose of IV Zosyn and then discharged with a recommendation to continue with the Bactrim that I previously prescribed for her. Nonetheless she is still having a lot of discomfort she tells me as well at this time. This is definitely unfortunate. No fevers, chills, nausea, or vomiting noted at this time. 09/08/18 on evaluation today patient's bilateral lower extremities actually appear to be shown signs of improvement which is good  news. Fortunately there does not appear to be any signs of active infection I think the anabiotic is helping in this regard. Overall I'm very pleased with how she is progressing. 09/15/18 patient was actually seen in ER yesterday due to her legs as well unfortunately. She states she's been having a lot of pain and discomfort as well as a lot of drainage. Upon inspection today the patient does have a lot of swelling and drainage I feel like this is more related to lymphedema and poor fluid control than it is to infection based on what I'm seeing. The physician in the emergency department also doubted that the patient was having a significant infection nonetheless I see no evidence of infection obvious at this point although I do see evidence of poor fluid control. She still not using a compression pumps, she is not elevating due to her lift chair as well as her hospital bed being broken, and she really is not keeping her  legs up as much as they should be and also has been taking off her wraps. All this combined I think has led to poor fluid control and to be honest she may be somewhat volume overloaded in general as well. I recommend that she may need to contact your physician to see if a prescription for a diuretic would be beneficial in their opinion. As long as this is safe I think it would likely help her. 09/29/18 on evaluation today patient's left lower extremity actually appears to be doing quite a bit better. At least compared to last time that I saw her. She still has a large area where she is draining from but there's a lot of new skin speckled trout and in fact there's more new skin that there are open areas of weeping and drainage at this point. This is good news. With regard to the right lower extremity this is doing much better with the only open area that I really see being a dry spot on the right lateral ankle currently. Fortunately there's no signs of active infection at this time which is good news. No fevers, chills, nausea, or vomiting noted at this time. The patient seems somewhat stressed and overwhelmed during the visit today she was very lethargic as such. She does and she is not taking any pain medications at this point. Apparently according to her husband are also in the process of moving which is probably taking its toll on her as well. 10/06/18 on evaluation today patient appears to be doing rather well in regard to her lower extremities compared to last evaluation. Fortunately there's no signs of active infection. She tells me she did have an appointment with her primary. Nonetheless he was concerned that the wounds were somewhat deep based on pictures but we never actually saw her legs. She states that he had her somewhat worried due to the fact that she was fearing now that she was [place] have to have an amputation. With that being said based on what I'm seeing check she looks  better this week that she has the last two times I've seen her with much less drainage I'm actually pleased in this regard. That doesn't mean that she's out of the water but again I do not think what the point of talking about education at all in regard to her leg. She is very happy to hear this. She is also not having as much pain as she was having last week. 10/13/18 unfortunately on evaluation today patient  still continues to have a significant amount of drainage she's not letting home health actually apply the compression dressings at this point. She's trying to use of Juxta-Lite of the top of Kerlex and the second layer of the three layer compression wrap. With that being said she just does not seem to be making as good a progress as I would expect if she was having the compression applied and in place on a regular basis. No fevers, chills, nausea, or vomiting noted at this time. 10/20/18 on evaluation today patient appears to be doing a little better in regard to her bilateral lower extremity ulcers. In fact the right lower extremity seems to be healed she doesn't even have any openings at this point left lower extremity though still somewhat macerated seems to be showing signs of new skin growth at multiple locations throughout. Fortunately there's no evidence of active infection at this time. No fevers, chills, nausea, or vomiting noted at this time. 10/27/18 on evaluation today patient appears to be doing much better in regard to her left lower Trinity ulcer. She's been tolerating the laptop complication and has minimal drainage noted at this point. Fortunately there's no signs of active infection at this time. No fevers, chills, nausea, or vomiting noted at this time. 11/03/18 on evaluation today patient actually appears to be doing excellent in regard to her left lower extremity. She is having very little drainage at this point there does not appear to be any significant signs of infection  overall very pleased with how things have gone. She is likewise extremely pleased still and seems to be making wonderful progress week to week. I do believe antibiotics were helpful for her. Her primary care provider did place on amateur clean since I last saw her. 11/17/18 on evaluation today patient appears to be doing worse in regard to her bilateral lower extremities at this point. She is been tolerating the dressing changes without complication. With that being said she typically takes the Coban off fairly quickly upon arriving home even after being seen here in the clinic and does not allow home health reapply command as part of the dressing at home. Therefore she said no compression essentially since I last saw her as best I can tell. With that being said I think it shows and how much swelling she has in the open wounds that are noted at this point. Fortunately there's no signs of infection but unfortunately if she doesn't get this under control I think she will end up with infection and more significant issues. 11/24/18 on evaluation today patient actually appears to be doing somewhat better in regard to her bilateral lower extremities. 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 Nonetheless that is doing better currently so she should be able to attempt reinitiating the lymphedema pumps at this time. No fevers, chills, nausea, or vomiting noted at this time. 12/01/18 upon evaluation today patient's left lower extremity appears to be doing quite well unfortunately her right lower extremity is not doing nearly as well. She has been tolerating the dressing changes without complication unfortunately she did not keep a wrap on the right at this time. Nonetheless I believe this has led to increased swelling and weeping in the world is actually much larger  than during the last evaluation with her. 12/08/18  on evaluation today patient appears to be doing about the same at this point in regard to her right lower extremity. There is some more palatable to touch I'm concerned about the possibility of there being some infection although I think the main issue is she's not keeping her compression wrap on which in turn is not allowing this area to heal appropriately. 12/22/18 on evaluation today patient appears to be doing better in regard to left lower extremity unfortunately significantly worse in regard to the right lower extremity. The areas of blistering and necrotic superficial tissue have spread and again this does not really appear to be signs of infection and all she just doesn't seem to be doing nearly as well is what she has been in the past. Overall I feel like the Augmentin did absolutely nothing for her she doesn't seem to have any infection again I really didn't think so last time either is more of a potential preventative measure and hoping that this would make some difference but I think the main issue is she's not wearing her compression. She tells me she cannot wear the [place] been we put on she takes it off pretty much upon getting home. Subsequently she worshiped Juxta-Lite when I questioned her about how often she wears it this is no more than three hours a day obviously that leaves 21 hours that she has no compression and this is obviously not doing well for her. Overall I'm concerned that if things continue to worsen she is at great risk of both infection as well as losing her leg. 01/05/19 on evaluation today patient appears to be doing well in regard to her left lower extremity which he is allowing [place] to wrap and not so well with regard to her right lower extremity which she is not allowing [place] to really wrap and keep the wrap on. She states that it hurts too badly whenever it's wrapped and she ends up having to take it off. She's  been using the Juxta-Lite she tells me up to six hours a day although I question whether or not that's really been the case to be honest. Previously she told me three hours today nonetheless obviously the legs as long as the wrap is doing great when she is not is doing much more poorly. 01/12/2019 on evaluation today patient actually appears to be doing a little better in my opinion with regard to her right lower extremity ulcer. She has a small open area on the left lower extremity unfortunately but again this I think is part of the normal fluctuation of what she is going to have to expect with regard to her legs especially when she is not using her lymphedema pumps on a regular basis. Subsequently based on what I am seeing today I think that she does seem to be doing slightly better with regard to her right lower extremity she did see her primary care provider on Monday they felt she had an infection and placed her on 2 antibiotics. Both Cipro and clindamycin. Subsequently again she seems possibly to be doing a little bit better in regards to the right lower extremity she also tells me however she has been wearing the compression wrap over the past week since I spoke with her as well that is a Kerlix and Coban wrap on the right. No fevers, chills, nausea, vomiting, or diarrhea. 01/19/2019 on evaluation today patient appears to be doing better with regard to her bilateral lower extremities especially the right.  I feel like the compression has been beneficial for her which is great news. She did get a call from her primary care provider on her way here today telling her that she did have methicillin-resistant Staphylococcus aureus and he was calling in a couple new antibiotics for her including a ointment to be applied she tells me 3 times a day. With that being said this sounds like likely to be Bactroban which I think could be applied with each dressing/wrap change but I would not be able to  accommodate her applying this 3 times a day. She is in agreement with the least doing this we will add that to her orders today. 01/26/2019 on evaluation today patient actually appears to be doing much better with regard to her right lower extremity. Her left lower extremity is also doing quite well all things considering. Fortunately there is no evidence of active infection at this time. No fevers, chills, nausea, vomiting, or diarrhea. 02/02/2019 on evaluation today patient appears to be doing much better compared to her last evaluation. Little by little off like her right leg is returning more towards normal. There does not appear to be any signs of active infection and overall she seems to be doing quite well which is great news. I am very pleased in this regard. No fevers, chills, nausea, vomiting, or diarrhea. 02/09/2019 upon evaluation today patient appears to be doing better with regard to her bilateral lower extremities. She has been tolerating the dressing changes without complication. Fortunately there is no signs of active infection at this time. No fevers, chills, nausea, vomiting, or diarrhea. 02/23/2019 on evaluation today patient actually appears to be doing quite well with regard to her bilateral lower extremities. She has been tolerating the dressing changes without complication. She is even used her pumps one time and states that she really felt like it felt good. With that being said she seems to be in good spirits and her legs appear to be doing excellent. 03/09/2019 on evaluation today patient appears to be doing well with regard to her right lower extremity there are no open wounds at this time she is having some discomfort but I feel like this is more neuropathy than anything. With regard to her left lower extremity she had several areas scattered around that she does have some weeping and drainage from but again overall she does not appear to be having any significant issues and  no evidence of infection at this time which is good news. 03/23/2019 on evaluation today patient appears to be doing well with regard to her right lower extremity which she tells me is still close she is using her juxta light here. Her left lower extremity she mainly just has an area on the foot which is still slightly draining although this also is doing great. Overall very pleased at this time. 04/06/2019 patient appears to be doing a little bit worse in regard to her left lower extremity upon evaluation today. She feels like this could be becoming infected again which she had issues with previous. Fortunately there is no signs of systemic infection but again this is always a struggle with her with her legs she will go from doing well to not so well in a very short amount of time. 04/20/2019 on evaluation today patient actually appears to be doing quite well with regard to her right lower extremity I do not see any signs of active infection at this time. Fortunately there is no fever chills noted.  She is still taking the antibiotics which I prescribed for her at this point. In regard to the left lower extremity I do feel like some of these areas are better although again she still is having weeping from several locations at this time. 04/27/2019 on evaluation today patient appears to be doing about the same if not slightly worse in regard to her left lower extremity ulcers. She tells me when questioned that she has been sleeping in her Hoveround chair in fact she tells me she falls asleep without even knowing it. I think she is spending a whole lot of time in the chair and less time walking and moving around which is not good for her legs either. On top of that she is in a seated position which is also the worst position she is not really elevating her legs and she is also not using her lymphedema pumps. All this is good to contribute to worsening of her condition in general. 05/18/2019 on  evaluation today patient appears to be doing well with regard to her lower extremity on the right in fact this is showing no signs of any open wounds at this time. On the left she is continuing to have issues with areas that do drain. Some of the regions have healed and there are couple areas that have reopened. She did go to the ER per the patient according to recommendations from the home health nurse due to what she was seen when she came out on 05/13/2019. Subsequently she felt like the patient needed to go to the hospital due to the fact that again she was having "milky white discharge" from her leg. Nonetheless she had and then was placed on doxycycline and subsequently seems to be doing better. 06/01/2019 upon evaluation today patient appears to be doing really in my opinion about the same. I do not see any signs of active infection which is good news. Overall she still has wounds over the bilateral lower extremities she has reopened on the right but this appears to be more of a crack where there is weeping/edema coming from the region. I do not see any evidence of infection at either site based on what I visualized today. 07/13/2019 upon evaluation today patient appears to be doing a little worse compared to last time I saw her. She since has been in the hospital from 06/21/2019 through 06/29/2019. This was secondary to having Covid. During that time they did apply lotion to her legs which unfortunately has caused her to develop a myriad of open wounds on her lower extremities. Her legs do appear to be doing better as far as the overall appearance is concerned but nonetheless she does have more open and weeping areas. 07/27/2019 upon evaluation today patient appears to be doing more poorly to be honest in regard to her left lower extremity in particular. There is no signs of systemic infection although I do believe she may have local infection. She notes she has been having a lot of  blue/green drainage which is consistent potentially with Pseudomonas. That may be something that we need to consider here as well. The doxycycline does not seem to have been helping. 08/03/2019 upon evaluation today patient appears to be doing a little better in my opinion compared to last week's evaluation. Her culture I did review today and she is on appropriate medications to help treat the Enterobacter that was noted. Overall I feel like that is good news. With that being said she is  unfortunately continuing to have a lot of drainage and though it is doing better I still think she has a long ways to go to get things dried up in general. Fortunately there is no signs of systemic infection. 08/10/2019 upon evaluation today patient appears to be doing may be slightly better in regard to her left lower extremity the right lower extremity is doing much better. Fortunately there is no signs of infection right now which is good news. No fevers, chills, nausea, vomiting, or diarrhea. 08/24/2019 on evaluation today patient appears to be doing slightly better in regard to her lower extremities. The left lower extremity seems to be healed the right lower extremity is doing better though not completely healed as far as the openings are concerned. She has some generalized issues here with edema and weeping secondary to her lymphedema though again I do believe this is little bit drier compared to prior weeks evaluations. In general I am very pleased with how things seem to be progressing. No fevers, chills, nausea, vomiting, or diarrhea. 08/31/2019 upon evaluation today patient actually seems to making some progress here with regard to the left lower extremity in particular. She has been tolerating the dressing changes without complication. Fortunately there is no signs of active infection at this time. No fevers, chills, nausea, vomiting, or diarrhea. She did see Dr. Doren [name] and he did note that she did have a  issue with the left great saphenous vein and the small saphenous vein in the leg. With that being said he was concerned about the possibility of laser ablation not being extremely successful. He also mentioned a small risk of DVT associated with the procedure. However if the wounds do not continue to improve he stated that that would probably be the way to go. Fortunately the patient's legs do seem to be doing much better. 09/07/2019 upon evaluation today patient appears to be doing better with regard to her lower extremities. She has been tolerating the dressing changes without complication. With that being said she is showing signs of improvement and overall very pleased. There are some areas on her leg that I think we do need to debride we discussed this last week the patient is in agreement with doing that as long as it does not hurt too badly. Electronic Signature(s) Signed: 09/07/2019 10:39:59 AM By: [name] PA-C Entered By: [name] on 09/07/2019 10:39:58 -------------------------------------------------------------------------------- Physical Exam Details Patient Name: Date of Service: [name], PIPPINS 09/07/2019 9:30 AM Medical Record [id number] Patient Account Number: [id number] Date of Birth/Sex: Treating RN: 12-14-1950 (69 y.o. [name] Primary Care Provider: [name] Other Clinician: Referring Provider: Treating Provider/Extender:Stone III, [name], FRED Weeks in Treatment: 109 Constitutional Obese and well-hydrated in no acute distress. Respiratory normal breathing without difficulty. Psychiatric this patient is able to make decisions and demonstrates good insight into disease process. Alert and Oriented x 3. pleasant and cooperative. Notes Patient's wound bed currently showed signs of good granulation at this time. Fortunately there is no evidence of active infection. No fevers, chills, nausea, vomiting, or diarrhea. In general I am  actually rather pleased with the way the patient has been showing signs of improvement. She still is having a lot of issues but again we are headed back in the right direction in my opinion. Electronic Signature(s) Signed: 09/07/2019 10:40:35 AM By: [name] PA-C Entered By: [name] on 09/07/2019 10:40:35 -------------------------------------------------------------------------------- Physician Orders Details Patient Name: Date of Service: [name]. 09/07/2019  9:30 AM Medical Record [id number] Patient Account Number: [id number] Date of Birth/Sex: Treating RN: 09-Feb-1951 (69 y.o. [name] Primary Care Provider: [name] Other Clinician: Referring Provider: Treating Provider/Extender:Stone III, [name], FRED Weeks in Treatment: [phone] Verbal / Phone Orders: No Diagnosis Coding ICD-10 Coding Code Description E11.622 Type 2 diabetes mellitus with other skin ulcer I89.0 Lymphedema, not elsewhere classified I87.331 Chronic venous hypertension (idiopathic) with ulcer and inflammation of right lower extremity I87.332 Chronic venous hypertension (idiopathic) with ulcer and inflammation of left lower extremity L97.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 leg with fat layer exposed L97.822 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 I10 Essential (primary) hypertension E66.01 Morbid (severe) obesity due to excess calories F41.8 Other specified anxiety disorders R53.1 Weakness Follow-up Appointments Return Appointment in 1 week. Dressing Change Frequency Wound #61 Left,Circumferential Lower Leg Change dressing three times week. Wound #64 Left,Dorsal Foot Change dressing three times week. Skin Barriers/Peri-Wound Care Barrier cream - zinc oxide cream to any macerated areas Moisturizing lotion - to right leg  daily Wound Cleansing Clean wound with Wound Cleanser - all wounds May shower with protection. Primary Wound Dressing Wound #61 Left,Circumferential Lower Leg Calcium Alginate with Silver Wound #64 Left,Dorsal Foot Calcium Alginate with Silver Secondary Dressing Wound #61 Left,Circumferential Lower Leg Dry Gauze - all wounds ABD pad - as needed Zetuvit or Kerramax - as needed Wound #64 Left,Dorsal Foot Dry Gauze - all wounds ABD pad - as needed Edema Control 3 Layer Compression System - Left Lower Extremity Avoid standing for long periods of time - walking is encouraged Elevate legs to the level of the heart or above for 30 minutes daily and/or when sitting, a frequency of: - do not sleep in chair with feet dangling, MUST elevate legs while sitting Exercise regularly Support Garment 20-30 mm/Hg pressure to: - Juxtalite to right lower leg daily Segmental Compressive Device. - lymphedema pumps 60 minutes 1- 2 times per day Off-Loading Turn and reposition every 2 hours Additional Orders / Instructions Follow Nutritious Diet - To include vitamin A, vitamin C, and Zinc along with increased protein intake. [facility] for wound care. - [facility] Electronic Signature(s) Signed: 09/07/2019 5:14:16 PM By: [name] PA-C Signed: 09/07/2019 5:36:50 PM By: [name] RN, BSN Entered By: [name] on 09/07/2019 10:39:04 -------------------------------------------------------------------------------- Problem List Details Patient Name: Date of Service: [name]. 09/07/2019 9:30 AM Medical Record [id number] Patient Account Number: [id number] Date of Birth/Sex: Treating RN: [birth year] (69 y.o. [name] Primary Care Provider: [name] Other Clinician: Referring Provider: Treating Provider/Extender:Stone III, [name], FRED Weeks in Treatment: 109 Active Problems ICD-10 Evaluated Encounter Code Description  Active Date Today Diagnosis E11.622 Type 2 diabetes mellitus with other skin ulcer 08/05/2017 No Yes I89.0 Lymphedema, not elsewhere classified 08/05/2017 No Yes I87.331 Chronic venous hypertension (idiopathic) with ulcer 08/05/2017 No Yes and inflammation of right lower extremity I87.332 Chronic venous hypertension (idiopathic) with ulcer 08/05/2017 No Yes and inflammation of left lower extremity L97.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 08/05/2017 No Yes leg with fat layer exposed L97.822 Non-pressure chronic ulcer of other part of left lower 08/05/2017 No Yes leg with fat layer exposed L97.522 Non-pressure chronic ulcer of other part of left foot 06/01/2019 No Yes with fat layer exposed I10 Essential (primary) hypertension 08/05/2017 No Yes  E66.01 Morbid (severe) obesity due to excess calories 08/05/2017 No Yes F41.8 Other specified anxiety disorders 08/05/2017 No Yes R53.1 Weakness 08/05/2017 No Yes Inactive Problems Resolved Problems Electronic Signature(s) Signed: 09/07/2019 9:51:04 AM By: [name] PA-C Entered By: [name] on 09/07/2019 09:51:03 -------------------------------------------------------------------------------- Progress Note Details Patient Name: Date of Service: [name]. 09/07/2019 9:30 AM Medical Record [id number] Patient Account Number: [id number] Date of Birth/Sex: Treating RN: 06/03/1951 (69 y.o. [name] Primary Care Provider: [name] Other Clinician: Referring Provider: Treating Provider/Extender:Stone III, [name], FRED Weeks in Treatment: 109 Subjective Chief Complaint Information obtained from Patient Bilateral reoccurring LE ulcers History of Present Illness (HPI) this patient has been seen a couple of times before and returns with recurrent problems to her right and left lower extremity with swelling and weeping ulcerations due to not wearing her compression stockings which she had  been advised to do during her last discharge, at the end of June 2018. During her last visit the patient had had normal arterial blood flow and her venous reflux study did not necessitate any surgical intervention. She was recommended compression and elevation and wound care. After prolonged treatment the patient was completely healed but she has been noncompliant with wearing or compressions.. She was here last week with an outpatient return visit planned but the patient came in a very poor general condition with altered mental status and was rushed to the ER on my request. With a history of hypertension, diabetes, TIA and right-sided weakness she was set up for an MRI on her brain and cervical spine and was sent to [facility]. Getting an MRI done was very difficult but once the workup was done she was found not to have any spinal stenosis, epidural abscess or hematoma or discitis. This was radiculopathy to be treated as an outpatient and she was given a follow-up appointment. Today she is feeling much better alert and oriented and has come to reevaluate her bilateral lower extremity lymphedema and ulceration 03/25/2017 -- she was admitted to the hospital on 03/16/2017 and discharged on 03/18/2017 with left leg cellulitis and ulceration. She was started on vancomycin and Zosyn and x-ray showed no bony involvement. She was treated for a cellulitis with IV antibiotics changed to Rocephin and Flagyl and was discharged on oral Keflex and doxycycline to complete a 7 day course. Last hemoglobin A1c was 7.1 and her other ailments including hypertension got asthma were appropriately treated. 05/06/2017 -- she is awaiting the right size of compression stockings from [facility] but other than that has been doing well. ====== Old notes 69 year old patient was seen one time last October and was lost to follow-up. She has recurrent problems with weeping and ulceration of her left lower extremity and has  swelling of this for several years. It has been worse for the last 2 months. Past medical history is significant for diabetes mellitus type 2, hypertension, gout, morbid obesity, depressive disorders, hiatal hernia, migraines, status post knee surgery, risk of a cholecystectomy, vaginal hysterectomy and breast biopsy. She is not a smoker. As noted before she has never had a venous duplex study and an arterial ABI study was attempted but the left lower extremity was noncompressible 10/01/2016 -- had a lower extremity venous duplex reflux evaluation which showed no evidence of deep vein reflux in the right or left lower extremity, and no evidence of great saphenous vein reflux more than 500 ms in the right or left lower extremity, and the left small saphenous  vein is incompetent but no vascular consult was recommended. review of her electronic medical records noted that the ABI was checked in July 2017 where the right ABI was normal limits and the left ABI could not be ascertained due to pain with cuff pressure but the waveforms are within normal limits. her arterial duplex study scheduled for April 27. 10/08/2016 -- the patient has various reasons for not having a compression on and for the last 3 days she has had no compression on her left lower extremity either due to pain or the lack of nursing help. She does not use her juxta lites either. 10/15/2016 -- the patient did not keep her appointment for arterial duplex study on April 27 and I have asked her to reschedule this. Her pain is out of proportion with the physical findings and she continuously fails to wear a compression wraps and cuts them off because she says she cannot tolerate the pain. She does not use her juxta lites either. 10/22/2016 -- he has rescheduled her arterial duplex study to May 21 and her pain today is a bit better. She has not been wearing her juxta lites on her right lower extremity but now understands that she needs  to do this. She did tolerate the to press compression wrap on her left lower extremity 10/29/2016 --arterial duplex study is scheduled for next week and overall she has been tolerating her compression wraps and also using her juxta lites on her right lower extremity 11/05/2016 -- the right ABI was 0.95 the left was 1.03. The digit TBI is on the right was 0.83 on the left was 0.92 and she had biphasic flow through these vessels. The impression was that of normal lower extremity arterial study. 11/12/2016 -- her pain is minimal and she is doing very well overall. 11/26/2016 -- she has got juxta lites and her insurance will not pay for additional dual layer compression stockings. She is going to order some from [facility]. 05/12/2017 -- her juxta lites are very old and too big for her and these have not been helping with compression. She did get 20-30 mm compression stockings from [place] but she and her husband are unable to put these on. I believe she will benefit from bilateral Extremit-ease, compression stockings and we will measure her for these today. 05/20/2017 -- lymphedema on the left lower extremity has increased a lot and she has a open ulceration as a result of this. The right lower extremity is looking pretty good. She has decided to by the compression stockings herself and will get reimbursed by the home health, at a later date. 05/27/2017 -- her sciatica is bothering her a lot and she thought her left leg pain was caused due to the compression wrap and hence removed it and has significant lymphedema. There is no inflammation on this left lower extremity. 06/17/17 on evaluation today patient appears to be doing very well and in fact is completely healed in regard to her ulcerations. Unfortunately however she does have continued issues with lymphedema nonetheless. We did order compression garments for her unfortunately she states that the size that she received were large although  we ordered medium. Obviously this means she is not getting the optimal compression. She does not have those with her today and therefore we could not confirm and contact the company on her behalf. Nonetheless she does state that she is going to have her husband bring them by tomorrow so that we can verify and then get in  touch with the company. No fevers, chills, nausea, or vomiting noted at this time. Overall patient is doing better otherwise and I'm pleased with the progress she has made. 07/01/17 on evaluation today patient appears to be doing very well in regard to her bilateral lower extremity she does not have any openings at this point which is excellent news. Overall I'm pleased with how things have progressed up to this time. Since she is doing so well we did order her compression which we are seeing her today to ensure that it fits her properly and everything is doing well in that regard and then subsequently she will be discharged. ============ Old Notes: 03/31/16 patient presents today for evaluation concerning open wounds that she has over the left medial ankle region as well as the left dorsal foot. She has previously had this occur although it has been healed for a number of months after having this for about a year prior until her hospitalization on 01/05/16. At that point in time it appears that she was admitted to the hospital for left lower extremity cellulitis and was placed on vancomycin and Zosyn at that point. Eventually upon discharge on January 15, 2016 she was placed on doxycycline at that point in time. Later on 03/27/16 positive wound culture growing Escherichia coli this was switched to amoxicillin. Currently she tells me that she is having pain radiated to be a 7 out of 10 which can be as high as 10 out of 10 with palpation and manipulation of the wound. This wound appears to be mainly venous in nature due to the bilateral lower extremity venous stasis/lymphedema. This  is definitely much worse on her left than the right side. She does have type 1 diabetes mellitus, hypertension, morbid obesity, and is wheelchair dependent.during the course of the hospital stay a blood culture was also obtained and fortunately appeared negative. She also had an x-ray of the tibia/fibula on the left which showed no acute bone abnormality. Her white blood cell count which was performed last on 03/25/16 was 7.3, hemoglobin 12.8, protein 7.1, albumin 3.0. Her urine culture appeared to be negative for any specific organisms. Patient did have a left lower extremity venous duplex evaluation for DVT. This did not include venous reflux studies but fortunately was negative for DVT. Patient also had arterial studies performed which revealed that she had a normal ABI on the right though this was unable to be performed on the left secondary to pain that she was having around the ankle region due to the wound. However it was stated on report that she had biphasic pulses and apparently good blood flow. ========== 06/03/17 she is here in follow-up evaluation for right lower extremity ulcer. The right lower sure he has healed but she has reopened to the left medial malleolus and dorsal foot with weeping. She is waiting for new compression garments to arrive from home health, the previous compression garments were ill fitting. We will continue with compression bilaterally and follow-up in 2 weeks Readmission: 08/05/17 on evaluation today patient appears to be doing somewhat poorly in regard to her left lower extremity especially although the right lower extremity has a small area which may no longer be open. She has been having a lot of drainage from the left lower extremity however he tells me that she has not been able to use the EXTREMIT- EASE Compression at this point. She states that she did better and was able to actually apply the Juxta-Lite compression although the  wound that she has is  too large and therefore really does not compress which is why she cannot wear it at this point. She has no one who can help her put it on regular basis her son can sometimes but he's not able to do it most of the time. I do believe that's why she has begun to weave and have issues as she is currently yet again. No fevers, chills, nausea, or vomiting noted at this time. Patient is no evidence of dementia. 08/12/17 on evaluation today patient appears to still be doing fairly well in regard to the draining areas/weeping areas at this point. With that being said she unfortunately did go to the ER yesterday due to what was felt to be possibly a cellulitis. They place her on doxycycline by mouth and discharge her home. She definitely was not admitted. With that being said she states she has had more discomfort which has been unusual for her even compared to prior times and she's had infections.08/12/17 on evaluation today patient appears to still be doing fairly well in regard to the draining areas/weeping areas at this point. With that being said she unfortunately did go to the ER yesterday due to what was felt to be possibly a cellulitis. They place her on doxycycline by mouth and discharge her home. She definitely was not admitted. With that being said she states she has had more discomfort which has been unusual for her even compared to prior times and she's had infections. 08/19/17 put evaluation today patient tells me that she's been having a lot of what sounds to be neuropathic type pain in regard to her left lower extremity. She has been using over-the-counter topical bins again which some believe. That in order to apply the she actually remove the wrap we put on her last Wednesday on Thursday. Subsequently she has not had anything on compression wise since that time. The good news is a lot of the weeping areas appear to have closed at this point again I believe she would do better with compression  but we are struggling to get her to actually use what she needs to at this point. No fevers, chills, nausea, or vomiting noted at this time. 09/03/17 on evaluation today patient appears to be doing okay in regard to her lower extremities in regard to the lymphedema and weeping. Fortunately she does not seem to show any signs of infection at this point she does have a little bit of weeping occurring in the right medial malleolus area. With that being said this does not appear to be too significant which is good news. 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 She has severe skin damage secondary to both of these features involving the dorsal left foot and medial left ankle and lower leg. Still has open areas in the left anterior foot. The area on the right closed over. She uses her own juxta light stockings. She does not have an arterial issue 09/16/17 on evaluation today patient actually appears to be doing excellent in regard to her bilateral lower extremity swelling. The Juxta-Lite compression wrap seem to be doing very well for her. She has not however been using the portion that goes over her foot. Her left foot still is draining a little bit not nearly as significant as it has been in the past but still I do believe that she likely needs to utilize the full wrap including the foot portion  of this will improve as well. She also has been apparently putting on a significant amount of Vaseline which also think is not helpful for her. I recommended that if she feels she needs something for moisturizer Eucerin will probably be better. 09/30/17 on evaluation today patient presents with several new open areas in regard to her left lower extremity although these appear to be minimal and mainly seem to be more moisture breakdown than anything. Fortunately she does not seem to have any evidence of infection which is great news. She has  been tolerating the dressing changes without complication we are using silver alginate on the foot she has been using AB pads to have the legs and using her Juxta-Lite compression which seems to be controlling her swelling very well. Overall I'm pleased with the poor way she has progressed. 10/14/17 on evaluation today patient appears to be doing better in regard to her left lower extremity areas of weeping. She does still have some discomfort although in general this does not appear to be as macerated and I think it is progressing nicely. I do think she still needs to wear the foot portion of her Juxta-Lite in order to get the most benefit from the wrap obviously. She states she understands. Fortunately there does not appear to be evidence of infection at this time which is great news. 10/28/17 on evaluation today patient appears to be doing excellent in regard to her left lower extremity. She has just a couple areas that are still open and seem to be causing any trouble whatsoever. For that reason I think that she is definitely headed in the right direction the spots are very tiny compared to what we have been dealing with in the past. 11/11/17 on evaluation today patient appears to have a right lateral lower extremity ulcer that has opened since I last saw her. She states this is where the home health nurse that was coming out remove the dressing without wetting the alginate first. Nonetheless I do not know if this is indeed the case or not but more importantly we have not ordered home help to be coming out for her wounds at all. I'm unsure as to why they are coming out and we're gonna have to check on this and get things situated in that regard. With that being said we currently really do not need them to be coming out as the patient has been taking care of her leg herself without complication and no issues. In fact she was doing much better prior to nursing coming out. 11/25/17 on evaluation  today patient actually appears to be doing fairly well in regard to her left lower extremity swelling. In fact she has very little area of weeping at this point there's just a small spot on the lateral portion of her right leg that still has me just a little bit more concerned as far as wanting to see this clear up before I discharge her to caring for this at home. Nonetheless overall she has made excellent progress. 12/09/17 on evaluation today patient appears to be doing rather well in regard to her lower extremity edema. She does have some weeping still in the left lower extremity although the big area we were taking care of two weeks ago actually has closed and she has another area of weeping on the left lower extremity immediately as well is the top of her foot. She does not currently have lymphedema pumps she has been wearing her compression daily  on a regular basis as directed. With that being said I think she may benefit from lymphedema pumps. 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 We had a very difficult time getting and keeping this under control. 12/23/17 on evaluation today patient actually appears to be doing a little bit more poorly in regard to her bilateral lower extremities. She has been tolerating the Juxta-Lite compression wraps. Unfortunately she has two new ulcers on the right lower extremity and left lower Trinity ulceration seems to be larger. Obviously this is not good news. She has been tolerating the dressings without complication. 12/30/17 on evaluation today patient actually appears to be doing much better in regard to her bilateral lower extremity edema. She continues to have some issues with ulcerations and in fact there appears to be one spot on each leg where the wrap may have caused a little bit of a blister which  is subsequently opened up at this point is given her pain. Fortunately it does not appear to be any evidence of infection which is good news. No fevers chills noted. 01/13/18 on evaluation today patient appears to be doing rather well in regard to her bilateral lower extremities. The dressings did get kind of stuck as far as the wound beds are concerned but again I think this is mainly due to the fact that she actually seems to be showing signs of healing which is good news. She's not having as much drainage therefore she was having more of the dressing sticking. Nonetheless overall I feel like her swelling is dramatically down compared to previous. 01/20/18 on evaluation today patient unfortunately though she's doing better in most regards has a large blister on the left anterior lower extremity where she is draining quite significantly. Subsequently this is going to need debridement today in order to see what's underneath and ensure she does not continue to trapping fluid at this location. Nonetheless No fevers, chills, nausea, or vomiting noted at this time. 01/27/18 on evaluation today patient appears to be doing rather well at this point in regard to her right lower extremity there's just a very small area that she still has open at this point. With that being said I do believe that she is tolerating the compression wraps very well in making good progress. Home health is coming out at this point to see her. Her left lower extremity on the lateral portion is actually what still mainly open and causing her some discomfort for the most part 02/10/18 on evaluation today patient actually appears to be doing very well in regard to her right lower extremity were all the ulcers appear to be completely close. In regard to the left lower extremity she does have two areas still open and some leaking from the dorsal surface of her foot but this still seems to be doing much better to me in general. 02/24/18 on  evaluation today patient actually appears to be doing much better in regard to her right lower extremity this is still completely healed. Her left lower extremity is also doing much better fortunately she has no evidence of infection. The one area that is gonna require some debridement is still on the left anterior shin. Fortunately this is not hurting her as badly today. 03/10/18 on evaluation today patient appears to be doing better in some regards although she has  a little bit more open area on the dorsal foot and she also has some issues on the medial portion of the left lower extremity which is actually new and somewhat deep. With that being said there fortunately does not appear to be any significant signs of infection which is good news. No fevers, chills, nausea, or vomiting noted at this time. In general her swelling seems to be doing fairly well which is good news. 03/31/18 on evaluation today patient presents for follow-up concerning her left lower extremity lymphedema. Unfortunately she has been doing a little bit more poorly since I last saw her in regard to the amount of weeping that she is experiencing. She's also having some increased pain in the anterior shin location. Unfortunately I do not feel like the patient is making such good progress at this point a few weeks back she was definitely doing much better. 04/07/18 on evaluation today patient actually appears to be showing some signs of improvement as far as the left lower extremity is concerned. She has been tolerating the dressing changes and it does appear that the Drawtex did better for her. With that being said unfortunately home health is stating that they cannot obtain the Drawtex going forward. Nonetheless we're gonna have to check and see what they may be able to get the alginate they were using was getting stuck in causing new areas of skin being pulled all that with and subsequently weep and calls her to worsen overall  this is the first time we've seen improvement at this time. 04/14/18 on evaluation today patient actually appears to be doing rather well at this point there does not appear to be any evidence of infection at this time and she is actually doing excellent in regard to the weeping in fact she almost has no openings remaining even compared to just last week this is a dramatic improvement. No fevers chills noted 04/21/18 evaluation today patient actually appears to be doing very well. She in fact is has a small area on the posterior lower extremity location and she has a small area on the dorsal surface of her foot that are still open both of which are very close to closing. We're hoping this will be close shortly. She brought her Juxta-Lite wrap with her today hoping that would be able to put her in it unfortunately I don't think were quite at that point yet but we're getting closer. 04/28/18 upon evaluation today patient actually appears to be doing excellent in regard to her left lower extremity ulcer. In fact the region on the posterior lower extremity actually is much smaller than previously noted. Overall I'm very happy with the progress she has made. She again did bring her Juxta-Lite although we're not quite ready for that yet. 05/11/18 upon evaluation today patient actually appears to be doing in general fairly well in regard to her left lower Trinity. The swelling is very well controlled. With that being said she has a new area on the left anterior lower extremity as well as between the first and second toes of her left foot that was not present during the last evaluation. The region of her posterior left lower extremity actually appears to be almost completely healed. To be honest I'm very pleased with the way that stands. Nonetheless I do believe that the lotion may be keeping the area to moist as far as her legs are concerned subsequently I'm gonna consider discontinuing that  today. 05/26/18 on evaluation today patient appears  to be doing rather well in regard to her left lower should be ulcers. In fact everything appears to be close except for a very small area on the left posterior lower extremity. Fortunately there does not appear to be any evidence of infection at this time. Overall very pleased with her progress. 06/02/18 and evaluation today patient actually appears to be doing very well in regard to her lower extremity ulcers. She has one small area that still continues to weep that I think may benefit her being able to justify lotion and user Juxta-Lite wraps versus continued to wrap her. Nonetheless I think this is something we can definitely look into at this point. 06/23/18 on evaluation today patient unfortunately has openings of her bilateral lower extremities. In general she seems to be doing much worse than when I last saw her just as far as her overall health standpoint is concerned. She states that her discomfort is mainly due to neuropathy she's not having any other issues otherwise. No fevers, chills, nausea, or vomiting noted at this time. 06/30/18 on evaluation today patient actually appears to be doing a little worse in regard to her right lower extremity her left lower extremity of doing fairly well. Fortunately there is no sign of infection at this time. She has been tolerating the dressing changes without complication. Home health did not come out like they were supposed to for the appropriate wrap changes. They stated that they never received the orders from [place] which were fax. Nonetheless we will send a copy of the orders with the patient today as well. 07/07/18 on evaluation today patient appears to be doing much better in regard to lower extremities. She still has several openings bilaterally although since I last saw her her legs did show obvious signs of infection when she later saw her nurse. Subsequently a culture was obtained and she is  been placed on Bactrim and Keflex. Fortunately things seem to be looking much better it does appear she likely had an infection. Again last week we'd even discussed it but again there really was not any obvious sign that she had infection therefore we held off on the antibiotics. Nonetheless I'm glad she's doing better today. 07/14/18 on evaluation today patient appears to be doing much better regarding her bilateral lower Trinity's. In fact on the right lower for me there's nothing open at this point there are some dry skin areas at the sites where she had infection. Fortunately there is no evidence of systemic infection which is excellent news. No fevers chills noted 07/21/18 on evaluation today patient actually appears to be doing much better in regard to her left lower extremity ulcers. She is making good progress and overall I feel like she's improving each time I see her. She's having no pain I do feel like the infection is completely resolved which is excellent news. No fevers, chills, nausea, or vomiting noted at this time. 07/28/18 on evaluation today patient appears to be doing very well in regard to her left lower [employer]. Everything seems to be showing signs of improvement which is excellent news. Overall very pleased with the progress that has been made. Fortunately there's no evidence of active infection at this time also excellent news. 08/04/18 on evaluation today patient appears to be doing more poorly in regard to her bilateral lower extremities. She has two new areas open up on the right and these were completely closed as of last week. She still has the two spots on  the left which in my pinion seem to be doing better. Fortunately there's no evidence of infection again at this point. 08/11/18 on evaluation today patient actually appears to be doing very well in regard to her bilateral lower Trinity wounds that all seem to be doing better and are measures smaller today.  Fortunately there's no signs of infection. No fevers, chills, nausea, or vomiting noted at this time. 08/18/18 on evaluation today patient actually appears to be doing about the same inverter bilateral lower extremities. She continues to have areas that blistering open as was drain that fortunately nothing too significant. Overall I feel like Drawtex may have done better for her however compared to the collagen. 08/25/18 on evaluation today patient appears to be doing a little bit more poorly today even compared to last time I saw her. Again I'm not exactly sure why she's making worse progress over the past several weeks. I'm beginning to wonder if there is some kind of underlying low level infection causing this issue. I did actually take a culture from the left anterior lower extremity but it was a new wound draining quite a bit at this point. Unfortunately she also seems to be having more pain which is what also makes me worried about the possibility of infection. This is despite never erythema noted at this point. 09/01/18 on evaluation today patient actually appears to be doing a little worse even compared to last week in regard to bilateral lower extremities. She did go to the hospital on the 16th was given a dose of IV Zosyn and then discharged with a recommendation to continue with the Bactrim that I previously prescribed for her. Nonetheless she is still having a lot of discomfort she tells me as well at this time. This is definitely unfortunate. No fevers, chills, nausea, or vomiting noted at this time. 09/08/18 on evaluation today patient's bilateral lower extremities actually appear to be shown signs of improvement which is good news. Fortunately there does not appear to be any signs of active infection I think the anabiotic is helping in this regard. Overall I'm very pleased with how she is progressing. 09/15/18 patient was actually seen in ER yesterday due to her legs as well unfortunately.  She states she's been having a lot of pain and discomfort as well as a lot of drainage. Upon inspection today the patient does have a lot of swelling and drainage I feel like this is more related to lymphedema and poor fluid control than it is to infection based on what I'm seeing. The physician in the emergency department also doubted that the patient was having a significant infection nonetheless I see no evidence of infection obvious at this point although I do see evidence of poor fluid control. She still not using a compression pumps, she is not elevating due to her lift chair as well as her hospital bed being broken, and she really is not keeping her legs up as much as they should be and also has been taking off her wraps. All this combined I think has led to poor fluid control and to be honest she may be somewhat volume overloaded in general as well. I recommend that she may need to contact your physician to see if a prescription for a diuretic would be beneficial in their opinion. As long as this is safe I think it would likely help her. 09/29/18 on evaluation today patient's left lower extremity actually appears to be doing quite a bit better. At  least compared to last time that I saw her. She still has a large area where she is draining from but there's a lot of new skin speckled trout and in fact there's more new skin that there are open areas of weeping and drainage at this point. This is good news. With regard to the right lower extremity this is doing much better with the only open area that I really see being a dry spot on the right lateral ankle currently. Fortunately there's no signs of active infection at this time which is good news. No fevers, chills, nausea, or vomiting noted at this time. The patient seems somewhat stressed and overwhelmed during the visit today she was very lethargic as such. She does and she is not taking any pain medications at this point. Apparently  according to her husband are also in the process of moving which is probably taking its toll on her as well. 10/06/18 on evaluation today patient appears to be doing rather well in regard to her lower extremities compared to last evaluation. Fortunately there's no signs of active infection. She tells me she did have an appointment with her primary. Nonetheless he was concerned that the wounds were somewhat deep based on pictures but we never actually saw her legs. She states that he had her somewhat worried due to the fact that she was fearing now that she was [place] have to have an amputation. With that being said based on what I'm seeing check she looks better this week that she has the last two times I've seen her with much less drainage I'm actually pleased in this regard. That doesn't mean that she's out of the water but again I do not think what the point of talking about education at all in regard to her leg. She is very happy to hear this. She is also not having as much pain as she was having last week. 10/13/18 unfortunately on evaluation today patient still continues to have a significant amount of drainage she's not letting home health actually apply the compression dressings at this point. She's trying to use of Juxta-Lite of the top of Kerlex and the second layer of the three layer compression wrap. With that being said she just does not seem to be making as good a progress as I would expect if she was having the compression applied and in place on a regular basis. No fevers, chills, nausea, or vomiting noted at this time. 10/20/18 on evaluation today patient appears to be doing a little better in regard to her bilateral lower extremity ulcers. In fact the right lower extremity seems to be healed she doesn't even have any openings at this point left lower extremity though still somewhat macerated seems to be showing signs of new skin growth at multiple locations throughout. Fortunately  there's no evidence of active infection at this time. No fevers, chills, nausea, or vomiting noted at this time. 10/27/18 on evaluation today patient appears to be doing much better in regard to her left lower Trinity ulcer. She's been tolerating the laptop complication and has minimal drainage noted at this point. Fortunately there's no signs of active infection at this time. No fevers, chills, nausea, or vomiting noted at this time. 11/03/18 on evaluation today patient actually appears to be doing excellent in regard to her left lower extremity. She is having very little drainage at this point there does not appear to be any significant signs of infection overall very pleased with how things  have gone. She is likewise extremely pleased still and seems to be making wonderful progress week to week. I do believe antibiotics were helpful for her. Her primary care provider did place on amateur clean since I last saw her. 11/17/18 on evaluation today patient appears to be doing worse in regard to her bilateral lower extremities at this point. She is been tolerating the dressing changes without complication. With that being said she typically takes the [place] off fairly quickly upon arriving home even after being seen here in the clinic and does not allow home health reapply command as part of the dressing at home. Therefore she said no compression essentially since I last saw her as best I can tell. With that being said I think it shows and how much swelling she has in the open wounds that are noted at this point. Fortunately there's no signs of infection but unfortunately if she doesn't get this under control I think she will end up with infection and more significant issues. 11/24/18 on evaluation today patient actually appears to be doing somewhat better in regard to her bilateral lower extremities. She still tells me she has not been using her compression pumps she tells me the reason is that she had  gout of her right great toe and listen to much pain to do this over the past week. Nonetheless that is doing better currently so she should be able to attempt reinitiating the lymphedema pumps at this time. No fevers, chills, nausea, or vomiting noted at this time. 12/01/18 upon evaluation today patient's left lower extremity appears to be doing quite well unfortunately her right lower extremity is not doing nearly as well. She has been tolerating the dressing changes without complication unfortunately she did not keep a wrap on the right at this time. Nonetheless I believe this has led to increased swelling and weeping in the world is actually much larger than during the last evaluation with her. 12/08/18 on evaluation today patient appears to be doing about the same at this point in regard to her right lower extremity. There is some more palatable to touch I'm concerned about the possibility of there being some infection although I think the main issue is she's not keeping her compression wrap on which in turn is not allowing this area to heal appropriately. 12/22/18 on evaluation today patient appears to be doing better in regard to left lower extremity unfortunately significantly worse in regard to the right lower extremity. The areas of blistering and necrotic superficial tissue have spread and again this does not really appear to be signs of infection and all she just doesn't seem to be doing nearly as well is what she has been in the past. Overall I feel like the Augmentin did absolutely nothing for her she doesn't seem to have any infection again I really didn't think so last time either is more of a potential preventative measure and hoping that this would make some difference but I think the main issue is she's not wearing her compression. She tells me she cannot wear the [place] been we put on she takes it off pretty much upon getting home. Subsequently she worshiped Juxta-Lite when I  questioned her about how often she wears it this is no more than three hours a day obviously that leaves 21 hours that she has no compression and this is obviously not doing well for her. Overall I'm concerned that if things continue to worsen she is at great risk of  both infection as well as losing her leg. 01/05/19 on evaluation today patient appears to be doing well in regard to her left lower extremity which he is allowing [place] to wrap and not so well with regard to her right lower extremity which she is not allowing [place] to really wrap and keep the wrap on. She states that it hurts too badly whenever it's wrapped and she ends up having to take it off. She's been using the Juxta-Lite she tells me up to six hours a day although I question whether or not that's really been the case to be honest. Previously she told me three hours today nonetheless obviously the legs as long as the wrap is doing great when she is not is doing much more poorly. 01/12/2019 on evaluation today patient actually appears to be doing a little better in my opinion with regard to her right lower extremity ulcer. She has a small open area on the left lower extremity unfortunately but again this I think is part of the normal fluctuation of what she is going to have to expect with regard to her legs especially when she is not using her lymphedema pumps on a regular basis. Subsequently based on what I am seeing today I think that she does seem to be doing slightly better with regard to her right lower extremity she did see her primary care provider on Monday they felt she had an infection and placed her on 2 antibiotics. Both Cipro and clindamycin. Subsequently again she seems possibly to be doing a little bit better in regards to the right lower extremity she also tells me however she has been wearing the compression wrap over the past week since I spoke with her as well that is a Kerlix and Coban wrap on the right. No fevers,  chills, nausea, vomiting, or diarrhea. 01/19/2019 on evaluation today patient appears to be doing better with regard to her bilateral lower extremities especially the right. I feel like the compression has been beneficial for her which is great news. She did get a call from her primary care provider on her way here today telling her that she did have methicillin-resistant Staphylococcus aureus and he was calling in a couple new antibiotics for her including a ointment to be applied she tells me 3 times a day. With that being said this sounds like likely to be Bactroban which I think could be applied with each dressing/wrap change but I would not be able to accommodate her applying this 3 times a day. She is in agreement with the least doing this we will add that to her orders today. 01/26/2019 on evaluation today patient actually appears to be doing much better with regard to her right lower extremity. Her left lower extremity is also doing quite well all things considering. Fortunately there is no evidence of active infection at this time. No fevers, chills, nausea, vomiting, or diarrhea. 02/02/2019 on evaluation today patient appears to be doing much better compared to her last evaluation. Little by little off like her right leg is returning more towards normal. There does not appear to be any signs of active infection and overall she seems to be doing quite well which is great news. I am very pleased in this regard. No fevers, chills, nausea, vomiting, or diarrhea. 02/09/2019 upon evaluation today patient appears to be doing better with regard to her bilateral lower extremities. She has been tolerating the dressing changes without complication. Fortunately there is no signs of  active infection at this time. No fevers, chills, nausea, vomiting, or diarrhea. 02/23/2019 on evaluation today patient actually appears to be doing quite well with regard to her bilateral lower extremities. She has been  tolerating the dressing changes without complication. She is even used her pumps one time and states that she really felt like it felt good. With that being said she seems to be in good spirits and her legs appear to be doing excellent. 03/09/2019 on evaluation today patient appears to be doing well with regard to her right lower extremity there are no open wounds at this time she is having some discomfort but I feel like this is more neuropathy than anything. With regard to her left lower extremity she had several areas scattered around that she does have some weeping and drainage from but again overall she does not appear to be having any significant issues and no evidence of infection at this time which is good news. 03/23/2019 on evaluation today patient appears to be doing well with regard to her right lower extremity which she tells me is still close she is using her juxta light here. Her left lower extremity she mainly just has an area on the foot which is still slightly draining although this also is doing great. Overall very pleased at this time. 04/06/2019 patient appears to be doing a little bit worse in regard to her left lower extremity upon evaluation today. She feels like this could be becoming infected again which she had issues with previous. Fortunately there is no signs of systemic infection but again this is always a struggle with her with her legs she will go from doing well to not so well in a very short amount of time. 04/20/2019 on evaluation today patient actually appears to be doing quite well with regard to her right lower extremity I do not see any signs of active infection at this time. Fortunately there is no fever chills noted. She is still taking the antibiotics which I prescribed for her at this point. In regard to the left lower extremity I do feel like some of these areas are better although again she still is having weeping from several locations at this  time. 04/27/2019 on evaluation today patient appears to be doing about the same if not slightly worse in regard to her left lower extremity ulcers. She tells me when questioned that she has been sleeping in her Hoveround chair in fact she tells me she falls asleep without even knowing it. I think she is spending a whole lot of time in the chair and less time walking and moving around which is not good for her legs either. On top of that she is in a seated position which is also the worst position she is not really elevating her legs and she is also not using her lymphedema pumps. All this is good to contribute to worsening of her condition in general. 05/18/2019 on evaluation today patient appears to be doing well with regard to her lower extremity on the right in fact this is showing no signs of any open wounds at this time. On the left she is continuing to have issues with areas that do drain. Some of the regions have healed and there are couple areas that have reopened. She did go to the ER per the patient according to recommendations from the home health nurse due to what she was seen when she came out on 05/13/2019. Subsequently she felt like the patient  needed to go to the hospital due to the fact that again she was having "milky white discharge" from her leg. Nonetheless she had and then was placed on doxycycline and subsequently seems to be doing better. 06/01/2019 upon evaluation today patient appears to be doing really in my opinion about the same. I do not see any signs of active infection which is good news. Overall she still has wounds over the bilateral lower extremities she has reopened on the right but this appears to be more of a crack where there is weeping/edema coming from the region. I do not see any evidence of infection at either site based on what I visualized today. 07/13/2019 upon evaluation today patient appears to be doing a little worse compared to last time I saw her.  She since has been in the hospital from 06/21/2019 through 06/29/2019. This was secondary to having Covid. During that time they did apply lotion to her legs which unfortunately has caused her to develop a myriad of open wounds on her lower extremities. Her legs do appear to be doing better as far as the overall appearance is concerned but nonetheless she does have more open and weeping areas. 07/27/2019 upon evaluation today patient appears to be doing more poorly to be honest in regard to her left lower extremity in particular. There is no signs of systemic infection although I do believe she may have local infection. She notes she has been having a lot of blue/green drainage which is consistent potentially with Pseudomonas. That may be something that we need to consider here as well. The doxycycline does not seem to have been helping. 08/03/2019 upon evaluation today patient appears to be doing a little better in my opinion compared to last week's evaluation. Her culture I did review today and she is on appropriate medications to help treat the Enterobacter that was noted. Overall I feel like that is good news. With that being said she is unfortunately continuing to have a lot of drainage and though it is doing better I still think she has a long ways to go to get things dried up in general. Fortunately there is no signs of systemic infection. 08/10/2019 upon evaluation today patient appears to be doing may be slightly better in regard to her left lower extremity the right lower extremity is doing much better. Fortunately there is no signs of infection right now which is good news. No fevers, chills, nausea, vomiting, or diarrhea. 08/24/2019 on evaluation today patient appears to be doing slightly better in regard to her lower extremities. The left lower extremity seems to be healed the right lower extremity is doing better though not completely healed as far as the openings are concerned. She has  some generalized issues here with edema and weeping secondary to her lymphedema though again I do believe this is little bit drier compared to prior weeks evaluations. In general I am very pleased with how things seem to be progressing. No fevers, chills, nausea, vomiting, or diarrhea. 08/31/2019 upon evaluation today patient actually seems to making some progress here with regard to the left lower extremity in particular. She has been tolerating the dressing changes without complication. Fortunately there is no signs of active infection at this time. No fevers, chills, nausea, vomiting, or diarrhea. She did see Dr. Doren [name] and he did note that she did have a issue with the left great saphenous vein and the small saphenous vein in the leg. With that being said he was concerned  about the possibility of laser ablation not being extremely successful. He also mentioned a small risk of DVT associated with the procedure. However if the wounds do not continue to improve he stated that that would probably be the way to go. Fortunately the patient's legs do seem to be doing much better. 09/07/2019 upon evaluation today patient appears to be doing better with regard to her lower extremities. She has been tolerating the dressing changes without complication. With that being said she is showing signs of improvement and overall very pleased. There are some areas on her leg that I think we do need to debride we discussed this last week the patient is in agreement with doing that as long as it does not hurt too badly. Objective Constitutional Obese and well-hydrated in no acute distress. Vitals Time Taken: 9:24 AM, Height: 62 in, Weight: 335 lbs, BMI: 61.3, Temperature: 98.2 F, Pulse: 92 bpm, Respiratory Rate: 18 breaths/min, Blood Pressure: 130/71 mmHg. Respiratory normal breathing without difficulty. Psychiatric this patient is able to make decisions and demonstrates good insight into disease process. Alert  and Oriented x 3. pleasant and cooperative. General Notes: Patient's wound bed currently showed signs of good granulation at this time. Fortunately there is no evidence of active infection. No fevers, chills, nausea, vomiting, or diarrhea. In general I am actually rather pleased with the way the patient has been showing signs of improvement. She still is having a lot of issues but again we are headed back in the right direction in my opinion. Integumentary (Hair, Skin) Wound #61 status is Open. Original cause of wound was Gradually Appeared. The wound is located on the Left,Circumferential Lower Leg. The wound measures 18cm length x 28cm width x 0.2cm depth; 395.841cm^2 area and 79.168cm^3 volume. There is Fat Layer (Subcutaneous Tissue) Exposed exposed. There is no tunneling or undermining noted. There is a large amount of serous drainage noted. The wound margin is flat and intact. There is no granulation within the wound bed. There is a large (67-100%) amount of necrotic tissue within the wound bed including Adherent Slough. Wound #64 status is Open. Original cause of wound was Gradually Appeared. The wound is located on the Left,Dorsal Foot. The wound measures 7.5cm length x 7cm width x 0.1cm depth; 41.233cm^2 area and 4.123cm^3 volume. There is Fat Layer (Subcutaneous Tissue) Exposed exposed. There is no tunneling or undermining noted. There is a small amount of serous drainage noted. The wound margin is flat and intact. There is small (1-33%) pink, pale granulation within the wound bed. There is a large (67-100%) amount of necrotic tissue within the wound bed including Adherent Slough. 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 There was a Excisional Skin/Subcutaneous Tissue Debridement with a total area of 20 sq cm performed by [name], PA. With the following  instrument(s): Curette to remove Viable and Non-Viable tissue/material. Material removed includes Subcutaneous Tissue, Slough, Skin: Epidermis, and Fibrin/Exudate after achieving pain control using Other (benzocaine 20% spray). No specimens were taken. A time out was conducted at 10:30, prior to the start of the procedure. A Minimum amount of bleeding was controlled with Pressure. The procedure was tolerated well with a pain level of 2 throughout and a pain level of 0 following the procedure. Post Debridement Measurements: 18cm length x 28cm width x 0.1cm depth; 39.584cm^3 volume. Character of Wound/Ulcer Post Debridement is improved. Severity of Tissue Post Debridement is: Fat layer exposed. Post procedure Diagnosis Wound #61: Same as Pre-Procedure Pre-procedure diagnosis of Wound #61 is a Venous Leg Ulcer located on the Left,Circumferential Lower Leg . There was a Three Layer Compression Therapy Procedure by Carlene Coria, RN. Post procedure Diagnosis Wound #61: Same as Pre-Procedure Plan Follow-up Appointments: Return Appointment in 1 week. Dressing Change Frequency: Wound #61 Left,Circumferential Lower Leg: Change dressing three times week. Wound #64 Left,Dorsal Foot: Change dressing three  times week. Skin Barriers/Peri-Wound Care: Barrier cream - zinc oxide cream to any macerated areas Moisturizing lotion - to right leg daily Wound Cleansing: Clean wound with Wound Cleanser - all wounds May shower with protection. Primary Wound Dressing: Wound #61 Left,Circumferential Lower Leg: Calcium Alginate with Silver Wound #64 Left,Dorsal Foot: Calcium Alginate with Silver Secondary Dressing: Wound #61 Left,Circumferential Lower Leg: Dry Gauze - all wounds ABD pad - as needed Zetuvit or Kerramax - as needed Wound #64 Left,Dorsal Foot: Dry Gauze - all wounds ABD pad - as needed Edema Control: 3 Layer Compression System - Left Lower Extremity Avoid standing for long periods of time - walking is encouraged Elevate legs to the level of the heart or above for 30 minutes daily and/or when sitting, a frequency of: - do not sleep in chair with feet dangling, MUST elevate legs while sitting Exercise regularly Support Garment 20-30 mm/Hg pressure to: - Juxtalite to right lower leg daily Segmental Compressive Device. - lymphedema pumps 60 minutes 1- 2 times per day Off-Loading: Turn and reposition every 2 hours Additional Orders / Instructions: Follow Nutritious Diet - To include vitamin A, vitamin C, and Zinc along with increased protein intake. Home Health: [facility] for [facility]. - [facility]. I would recommend currently that we continue with the alginate dressing we will use Unna paste as well to help protect the good skin which has been doing very well for her. 2. I am also can recommend at this time she continue to elevate her legs she should also be using her pumps though to be honest I know she is not. 3. I am also can recommend that the patient continue with moisturizer to the right leg daily underneath her Velcro compression wraps. We will see patient back for reevaluation in 1 week here in the clinic. If anything worsens or changes patient  will contact our office for additional recommendations. Electronic Signature(s) Signed: 09/07/2019 10:41:12 AM By: [name] PA-C Entered By: [name] on 09/07/2019 10:41:12 -------------------------------------------------------------------------------- SuperBill Details Patient Name: Date of Service: [name] 09/07/2019 Medical Record [id number] Patient Account Number: [id number] Date of Birth/Sex: Treating RN: 04-18-1951 (69 y.o. [name] Primary Care Provider: [name] Other Clinician: Referring Provider: Treating Provider/Extender:Stone III, [name], FRED Weeks in Treatment: 109 Diagnosis Coding ICD-10 Codes Code Description E11.622 Type 2 diabetes mellitus with other skin ulcer I89.0 Lymphedema, not elsewhere classified I87.331 Chronic venous hypertension (  idiopathic) with ulcer and inflammation of right lower extremity I87.332 Chronic venous hypertension (idiopathic) with ulcer and inflammation of left lower extremity L97.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 leg with fat layer exposed L97.822 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 I10 Essential (primary) hypertension E66.01 Morbid (severe) obesity due to excess calories F41.8 Other specified anxiety disorders R53.1 Weakness Facility Procedures The patient participates with Medicare or their insurance follows the Medicare Facility Guidelines: CPT4 Code Description Modifier Quantity [id number] 11042 - DEB SUBQ TISSUE 20 SQ CM/< 1 ICD-10 Diagnosis Description L97.822 Non-pressure chronic ulcer of  other part of left lower leg with fat layer exposed Physician Procedures CPT4 Code Description: [id number] 11042 - WC PHYS SUBQ TISS 20 SQ CM ICD-10 Diagnosis Description L97.822 Non-pressure chronic ulcer of other part of left lower leg Modifier:  with fat layer Quantity: 1 exposed Electronic Signature(s) Signed: 09/07/2019 10:41:22 AM By: [name] PA-C Entered By: [name] on 09/07/2019 10:41:20

## 2019-09-14 ENCOUNTER — Encounter (HOSPITAL_BASED_OUTPATIENT_CLINIC_OR_DEPARTMENT_OTHER): Payer: Medicare PPO | Admitting: Physician Assistant

## 2019-09-14 ENCOUNTER — Other Ambulatory Visit: Payer: Self-pay

## 2019-09-14 DIAGNOSIS — E11622 Type 2 diabetes mellitus with other skin ulcer: Secondary | ICD-10-CM | POA: Diagnosis not present

## 2019-09-14 NOTE — Progress Notes (Signed)
LIZZETE, GOUGH ([id number]) Visit Report for 09/14/2019 Arrival Information Details Patient Name: Date of Service: NAYELY, DINGUS 09/14/2019 9:30 AM Medical Record [id number] Patient Account Number: [id number] Date of Birth/Sex: Treating RN: 03/17/1951 (69 y.o. [name] Primary Care Hessie Varone: [name] Other Clinician: Referring Lylian Sanagustin: Treating Ciel Chervenak/Extender:Stone III, [name], FRED Weeks in Treatment: 110 Visit Information History Since Last Visit Added or deleted any medications: No Patient Arrived: Wheel Chair Any new allergies or adverse reactions: No Arrival Time: 09:24 Had a fall or experienced change in No activities of daily living that may affect Accompanied By: self risk of falls: Transfer Assistance: None Signs or symptoms of abuse/neglect since last No Patient Identification Verified: Yes visito Secondary Verification Process Yes Hospitalized since last visit: No Completed: Implantable device outside of the clinic excluding No Patient Requires Transmission-Based No cellular tissue based products placed in the center Precautions: since last visit: Patient Has Alerts: Yes Has Dressing in Place as Prescribed: Yes Patient Alerts: R ABI= Has Compression in Place as Prescribed: Yes 1.01 Pain Present Now: Yes L ABI = .99 Electronic Signature(s) Signed: 09/14/2019 5:08:19 PM By: Deon Pilling Entered By: Deon Pilling on 09/14/2019 09:37:02 -------------------------------------------------------------------------------- Compression Therapy Details Patient Name: Date of Service: [name] 09/14/2019 9:30 AM Medical Record [id number] Patient Account Number: [id number] Date of Birth/Sex: Treating RN: 1950/11/10 (69 y.o. [name] Primary Care Blake Goya: [name] Other Clinician: Referring Emilee Market: Treating Panagiota Perfetti/Extender:Stone III, [name], FRED Weeks in Treatment: 110 Compression Therapy Performed  for Wound Wound #61 Left,Circumferential Lower Leg Assessment: Performed By: Clinician Carlene Coria, RN Compression Type: Three Layer Post Procedure Diagnosis Same as Pre-procedure Electronic Signature(s) Signed: 09/14/2019 6:50:57 PM By: [name] RN, BSN Entered By: [name] on 09/14/2019 10:39:18 -------------------------------------------------------------------------------- Lower Extremity Assessment Details Patient Name: Date of Service: [name] 09/14/2019 9:30 AM Medical Record [id number] Patient Account Number: [id number] Date of Birth/Sex: Treating RN: [birth year] (68 y.o. [name] Primary Care Latasha Puskas: [name] Other Clinician: Referring Eleonora Peeler: Treating Sofia Vanmeter/Extender:Stone III, [name], FRED Weeks in Treatment: 110 Edema Assessment Assessed: [Left: Yes] [Right: No] Edema: [Left: Yes] [Right: Yes] Calf Left: Right: Point of Measurement: 37 cm From Medial Instep 37.5 cm cm Ankle Left: Right: Point of Measurement: 10 cm From Medial Instep 29.5 cm cm Electronic Signature(s) Signed: 09/14/2019 5:08:19 PM By: Deon Pilling Entered By: Deon Pilling on 09/14/2019 09:38:13 -------------------------------------------------------------------------------- [place] Details Patient Name: Date of Service: [name] 09/14/2019 9:30 AM Medical Record [id number] Patient Account Number: [id number] Date of Birth/Sex: Treating RN: 06-14-1951 (69 y.o. [name] Primary Care Borna Wessinger: [name] Other Clinician: Referring Paisli Silfies: Treating Gyselle Matthew/Extender:Stone III, [name], FRED Weeks in Treatment: 110 Active Inactive Venous Leg Ulcer Nursing Diagnoses: Actual venous Insuffiency (use after diagnosis is confirmed) Knowledge deficit related to disease process and management Goals: Patient will maintain optimal edema control Date Initiated: 08/12/2017 Target Resolution Date:  09/21/2019 Goal Status: Active Patient/caregiver will verbalize understanding of disease process and disease management Date Initiated: 08/12/2017 Date Inactivated: 04/21/2018 Target Resolution Date: 04/24/2018 Goal Status: Met Interventions: Assess peripheral edema status every visit. Compression as ordered Treatment Activities: Therapeutic compression applied : 08/12/2017 Notes: Wound/Skin Impairment Nursing Diagnoses: Impaired tissue integrity Knowledge deficit related to ulceration/compromised skin integrity Goals: Patient/caregiver will verbalize understanding of skin care regimen Date Initiated: 08/12/2017 Target Resolution Date: 09/21/2019 Goal Status: Active Ulcer/skin breakdown will have a volume reduction of 30% by week 4 Date Initiated: 08/05/2017 Date Inactivated: 09/30/2017 Target Resolution Date: 10/03/2017  Goal Status: Met Ulcer/skin breakdown will have a volume reduction of 50% by week 8 Date Initiated: 09/30/2017 Date Inactivated: 10/28/2017 Target Resolution Date: 10/28/2017 Goal Status: Met Interventions: Assess patient/caregiver ability to perform ulcer/skin care regimen upon admission and as needed Assess ulceration(s) every visit Provide education on ulcer and skin care Screen for HBO Treatment Activities: Patient referred to home care : 08/05/2017 Skin care regimen initiated : 08/05/2017 Topical wound management initiated : 08/05/2017 Notes: Electronic Signature(s) Signed: 09/14/2019 6:50:57 PM By: [name] RN, BSN Entered By: [name] on 09/14/2019 10:38:14 -------------------------------------------------------------------------------- Pain Assessment Details Patient Name: Date of Service: [name] 09/14/2019 9:30 AM Medical Record [id number] Patient Account Number: [id number] Date of Birth/Sex: Treating RN: 16-Jun-1951 (69 y.o. [name] Primary Care Dareth Andrew: [name] Other Clinician: Referring Cortavious Nix: Treating  Tian Mcmurtrey/Extender:Stone III, [name], FRED Weeks in Treatment: 110 Active Problems Location of Pain Severity and Description of Pain Patient Has Paino Yes Site Locations Pain Location: Generalized Pain Rate the pain. Current Pain Level: 7 Worst Pain Level: 10 Least Pain Level: 0 Tolerable Pain Level: 8 Pain Management and Medication Current Pain Management: Medication: No Cold Application: No Rest: No Massage: No Activity: No T.E.N.S.: No Heat Application: No Leg drop or elevation: No Is the Current Pain Management Adequate: Adequate How does your wound impact your activities of daily livingo Sleep: No Bathing: No Appetite: No Relationship With Others: No Bladder Continence: No Emotions: No Bowel Continence: No Work: No Toileting: No Drive: No Dressing: No Hobbies: No Notes per patient pain in left shoulder r/t COVID vaccine. Electronic Signature(s) Signed: 09/14/2019 5:08:19 PM By: Deon Pilling Entered By: Deon Pilling on 09/14/2019 09:37:56 -------------------------------------------------------------------------------- Patient/Caregiver Education Details Patient Name: Date of Service: [name] 3/31/2021andnbsp9:30 AM Medical Record Patient Account Number: [id number] Number: Treating RN: [name] Date of Birth/Gender: [birth year] (69 y.o. Other Clinician: F) Treating [name] Primary Care Physician: [name] Physician/Extender: Referring Physician: Wilhelmenia Blase in Treatment: 110 Education Assessment Education Provided To: Patient Education Topics Provided Venous: Methods: Explain/Verbal Responses: Reinforcements needed, State content correctly Wound/Skin Impairment: Methods: Explain/Verbal Responses: Reinforcements needed, State content correctly Electronic Signature(s) Signed: 09/14/2019 6:50:57 PM By: [name] RN, BSN Entered By: [name] on 09/14/2019  10:38:47 -------------------------------------------------------------------------------- Wound Assessment Details Patient Name: Date of Service: [name] 09/14/2019 9:30 AM Medical Record [id number] Patient Account Number: [id number] Date of Birth/Sex: Treating RN: [birth year] (69 y.o. [name] Primary Care Analisa Sledd: [name] Other Clinician: Referring Dreshawn Hendershott: Treating [name] Minahan/Extender:Stone III, [name], FRED Weeks in Treatment: 110 Wound Status Wound Number: 61 Primary Venous Leg Ulcer Etiology: Wound Location: Left Lower Leg - Circumferential Wound Open Wounding Event: Gradually Appeared Status: Date Acquired: 04/20/2019 Comorbid Asthma, Hypertension, Peripheral Arterial Weeks Of Treatment: 21 History: Disease, Peripheral Venous Disease, Type II Clustered Wound: No Diabetes, Gout, Osteoarthritis Wound Measurements Length: (cm) 20 % Reduct Width: (cm) 23 % Reduct Depth: (cm) 0.1 Epitheli Area: (cm) 361.283 Tunneli Volume: (cm) 36.128 Undermi Wound Description Classification: Full Thickness Without Exposed Support Foul Odo Structures Slough/F Wound Flat and Intact Margin: Exudate Large Amount: Exudate Serous Type: Exudate amber Color: Wound Bed Granulation Amount: Small (1-33%) Granulation Quality: Pink, Pale Fascia E Necrotic Amount: Large (67-100%) Fat Laye Necrotic Quality: Adherent Slough Tendon E Muscle E Joint Ex Bone Exp r After Cleansing: No ibrino Yes Exposed Structure xposed: No r (Subcutaneous Tissue) Exposed: Yes xposed: No xposed: No posed: No osed: No ion in Area: -4691.6% ion in Volume: -4691.5% alization:  Small (1-33%) ng: No ning: No Electronic Signature(s) Signed: 09/14/2019 5:08:19 PM By: Deon Pilling Entered By: Deon Pilling on 09/14/2019 09:38:55 -------------------------------------------------------------------------------- Wound Assessment Details Patient Name: Date of Service: [name] 09/14/2019 9:30 AM Medical Record [id number] Patient Account Number: [id number] Date of Birth/Sex: Treating RN: [birth year] (69 y.o. [name] Primary Care Abrial Arrighi: [name] Other Clinician: Referring Romi Rathel: Treating Cutberto Winfree/Extender:Stone III, [name], FRED Weeks in Treatment: 110 Wound Status Wound Number: 64 Primary Diabetic Wound/Ulcer of the Lower Extremity Etiology: Wound Location: Left Foot - Dorsal Wound Open Wounding Event: Gradually Appeared Status: Date Acquired: 06/01/2019 Comorbid Asthma, Hypertension, Peripheral Arterial Weeks Of Treatment: 15 History: Disease, Peripheral Venous Disease, Type II Clustered Wound: No Diabetes, Gout, Osteoarthritis Wound Measurements Length: (cm) 7 Width: (cm) 6 Depth: (cm) 0.1 Area: (cm) 32.987 Volume: (cm) 3.299 Wound Description Classification: Grade 1 Wound Margin: Flat and Intact Exudate Amount: Small Exudate Type: Serous Exudate Color: amber Wound Bed Granulation Amount: Medium (34-66%) Granulation Quality: Pink, Pale Necrotic Amount: Medium (34-66%) Necrotic Quality: Adherent Slough fter Cleansing: No ino Yes Exposed Structure sed: No Subcutaneous Tissue) Exposed: Yes sed: No sed: No ed: No d: No % Reduction in Area: -19892.1% % Reduction in Volume: -6632.7% Epithelialization: Small (1-33%) Tunneling: No Undermining: No Foul Odor A Slough/Fibr Fascia Expo Fat Layer ( Tendon Expo Muscle Expo Joint Expos Bone Expose Electronic Signature(s) Signed: 09/14/2019 5:08:19 PM By: Deon Pilling Entered By: Deon Pilling on 09/14/2019 09:39:21 -------------------------------------------------------------------------------- Vitals Details Patient Name: Date of Service: [name] 09/14/2019 9:30 AM Medical Record [id number] Patient Account Number: [id number] Date of Birth/Sex: Treating RN: 1950-09-01 (69 y.o. [name] Primary Care Jeronica Stlouis: [name] Other Clinician: Referring Paul Torpey: Treating Trayvond Viets/Extender:Stone III, [name], FRED Weeks in Treatment: 110 Vital Signs Time Taken: 09:27 Temperature (F): 97.9 Height (in): 62 Pulse (bpm): 84 Weight (lbs): 335 Respiratory Rate (breaths/min): 18 Body Mass Index (BMI): 61.3 Blood Pressure (mmHg): 109/84 Capillary Blood Glucose (mg/dl): 124 Reference Range: 80 - 120 mg / dl Electronic Signature(s) Signed: 09/14/2019 5:08:19 PM By: Deon Pilling Entered By: Deon Pilling on 09/14/2019 09:37:28

## 2019-09-14 NOTE — Progress Notes (Addendum)
[name], KARWOWSKI ([id number]) Visit Report for 09/14/2019 Chief Complaint Document Details Patient Name: Date of Service: [name], [name] 09/14/2019 9:30 AM Medical Record [id number] Patient Account Number: [id number] Date of Birth/Sex: Treating RN: [birth year] (69 y.o. [name] Primary Care Provider: [name] Other Clinician: Referring Provider: Treating Provider/Extender:Stone III, [name], FRED Weeks in Treatment: 110 Information Obtained from: Patient Chief Complaint Bilateral reoccurring LE ulcers Electronic Signature(s) Signed: 09/14/2019 9:36:09 AM By: [name] PA-C Entered By: [name] on 09/14/2019 09:36:08 -------------------------------------------------------------------------------- HPI Details Patient Name: Date of Service: [name]. 09/14/2019 9:30 AM Medical Record [id number] Patient Account Number: [id number] Date of Birth/Sex: Treating RN: Sep 28, 1950 (68 y.o. [name] Primary Care Provider: [name] Other Clinician: Referring Provider: Treating Provider/Extender:Stone III, [name], FRED Weeks in Treatment: 110 History of Present Illness HPI Description: this patient has been seen a couple of times before and returns with recurrent problems to her right and left lower extremity with swelling and weeping ulcerations due to not wearing her compression stockings which she had been advised to do during her last discharge, at the end of June 2018. During her last visit the patient had had normal arterial blood flow and her venous reflux study did not necessitate any surgical intervention. She was recommended compression and elevation and wound care. After prolonged treatment the patient was completely healed but she has been noncompliant with wearing or compressions.. She was here last week with an outpatient return visit planned but the patient came in a very poor general condition with altered mental  status and was rushed to the ER on my request. With a history of hypertension, diabetes, TIA and right-sided weakness she was set up for an MRI on her brain and cervical spine and was sent to [facility]. Getting an MRI done was very difficult but once the workup was done she was found not to have any spinal stenosis, epidural abscess or hematoma or discitis. This was radiculopathy to be treated as an outpatient and she was given a follow-up appointment. Today she is feeling much better alert and oriented and has come to reevaluate her bilateral lower extremity lymphedema and ulceration 03/25/2017 -- she was admitted to the hospital on 03/16/2017 and discharged on 03/18/2017 with left leg cellulitis and ulceration. She was started on vancomycin and Zosyn and x-ray showed no bony involvement. She was treated for a cellulitis with IV antibiotics changed to Rocephin and Flagyl and was discharged on oral Keflex and doxycycline to complete a 7 day course. Last hemoglobin A1c was 7.1 and her other ailments including hypertension got asthma were appropriately treated. 05/06/2017 -- she is awaiting the right size of compression stockings from [place] but other than that has been doing well. ====== Old notes 69 year old patient was seen one time last October and was lost to follow-up. She has recurrent problems with weeping and ulceration of her left lower extremity and has swelling of this for several years. It has been worse for the last 2 months. Past medical history is significant for diabetes mellitus type 2, hypertension, gout, morbid obesity, depressive disorders, hiatal hernia, migraines, status post knee surgery, risk of a cholecystectomy, vaginal hysterectomy and breast biopsy. She is not a smoker. As noted before she has never had a venous duplex study and an arterial ABI study was attempted but the left lower extremity was noncompressible 10/01/2016 -- had a lower extremity venous duplex  reflux evaluation which showed no evidence of deep vein reflux  in the right or left lower extremity, and no evidence of great saphenous vein reflux more than 500 ms in the right or left lower extremity, and the left small saphenous vein is incompetent but no vascular consult was recommended. review of her electronic medical records noted that the ABI was checked in July 2017 where the right ABI was normal limits and the left ABI could not be ascertained due to pain with cuff pressure but the waveforms are within normal limits. her arterial duplex study scheduled for April 27. 10/08/2016 -- the patient has various reasons for not having a compression on and for the last 3 days she has had no compression on her left lower extremity either due to pain or the lack of nursing help. She does not use her juxta lites either. 10/15/2016 -- the patient did not keep her appointment for arterial duplex study on April 27 and I have asked her to reschedule this. Her pain is out of proportion with the physical findings and she continuously fails to wear a compression wraps and cuts them off because she says she cannot tolerate the pain. She does not use her juxta lites either. 10/22/2016 -- he has rescheduled her arterial duplex study to May 21 and her pain today is a bit better. She has not been wearing her juxta lites on her right lower extremity but now understands that she needs to do this. She did tolerate the to press compression wrap on her left lower extremity 10/29/2016 --arterial duplex study is scheduled for next week and overall she has been tolerating her compression wraps and also using her juxta lites on her right lower extremity 11/05/2016 -- the right ABI was 0.95 the left was 1.03. The digit TBI is on the right was 0.83 on the left was 0.92 and she had biphasic flow through these vessels. The impression was that of normal lower extremity arterial study. 11/12/2016 -- her pain is minimal and  she is doing very well overall. 11/26/2016 -- she has got juxta lites and her insurance will not pay for additional dual layer compression stockings. She is going to order some from [address]. 05/12/2017 -- her juxta lites are very old and too big for her and these have not been helping with compression. She did get 20-30 mm compression stockings from [place] but she and her husband are unable to put these on. I believe she will benefit from bilateral Extremit-ease, compression stockings and we will measure her for these today. 05/20/2017 -- lymphedema on the left lower extremity has increased a lot and she has a open ulceration as a result of this. The right lower extremity is looking pretty good. She has decided to by the compression stockings herself and will get reimbursed by the home health, at a later date. 05/27/2017 -- her sciatica is bothering her a lot and she thought her left leg pain was caused due to the compression wrap and hence removed it and has significant lymphedema. There is no inflammation on this left lower extremity. 06/17/17 on evaluation today patient appears to be doing very well and in fact is completely healed in regard to her ulcerations. Unfortunately however she does have continued issues with lymphedema nonetheless. We did order compression garments for her unfortunately she states that the size that she received were large although we ordered medium. Obviously this means she is not getting the optimal compression. She does not have those with her today and therefore we could not confirm and contact  the company on her behalf. Nonetheless she does state that she is going to have her husband bring them by tomorrow so that we can verify and then get in touch with the company. No fevers, chills, nausea, or vomiting noted at this time. Overall patient is doing better otherwise and I'm pleased with the progress she has made. 07/01/17 on evaluation today patient appears to  be doing very well in regard to her bilateral lower extremity she does not have any openings at this point which is excellent news. Overall I'm pleased with how things have progressed up to this time. Since she is doing so well we did order her compression which we are seeing her today to ensure that it fits her properly and everything is doing well in that regard and then subsequently she will be discharged. ============ Old Notes: 03/31/16 patient presents today for evaluation concerning open wounds that she has over the left medial ankle region as well as the left dorsal foot. She has previously had this occur although it has been healed for a number of months after having this for about a year prior until her hospitalization on 01/05/16. At that point in time it appears that she was admitted to the hospital for left lower extremity cellulitis and was placed on vancomycin and Zosyn at that point. Eventually upon discharge on January 15, 2016 she was placed on doxycycline at that point in time. Later on 03/27/16 positive wound culture growing Escherichia coli this was switched to amoxicillin. Currently she tells me that she is having pain radiated to be a 7 out of 10 which can be as high as 10 out of 10 with palpation and manipulation of the wound. This wound appears to be mainly venous in nature due to the bilateral lower extremity venous stasis/lymphedema. This is definitely much worse on her left than the right side. She does have type 1 diabetes mellitus, hypertension, morbid obesity, and is wheelchair dependent.during the course of the hospital stay a blood culture was also obtained and fortunately appeared negative. She also had an x-ray of the tibia/fibula on the left which showed no acute bone abnormality. Her white blood cell count which was performed last on 03/25/16 was 7.3, hemoglobin 12.8, protein 7.1, albumin 3.0. Her urine culture appeared to be negative for any specific  organisms. Patient did have a left lower extremity venous duplex evaluation for DVT. This did not include venous reflux studies but fortunately was negative for DVT. Patient also had arterial studies performed which revealed that she had a normal ABI on the right though this was unable to be performed on the left secondary to pain that she was having around the ankle region due to the wound. However it was stated on report that she had biphasic pulses and apparently good blood flow. ========== 06/03/17 she is here in follow-up evaluation for right lower extremity ulcer. The right lower sure he has healed but she has reopened to the left medial malleolus and dorsal foot with weeping. She is waiting for new compression garments to arrive from home health, the previous compression garments were ill fitting. We will continue with compression bilaterally and follow-up in 2 weeks Readmission: 08/05/17 on evaluation today patient appears to be doing somewhat poorly in regard to her left lower extremity especially although the right lower extremity has a small area which may no longer be open. She has been having a lot of drainage from the left lower extremity however he tells me that  she has not been able to use the EXTREMIT- EASE Compression at this point. She states that she did better and was able to actually apply the Juxta-Lite compression although the wound that she has is too large and therefore really does not compress which is why she cannot wear it at this point. She has no one who can help her put it on regular basis her son can sometimes but he's not able to do it most of the time. I do believe that's why she has begun to weave and have issues as she is currently yet again. No fevers, chills, nausea, or vomiting noted at this time. Patient is no evidence of dementia. 08/12/17 on evaluation today patient appears to still be doing fairly well in regard to the draining areas/weeping areas at  this point. With that being said she unfortunately did go to the ER yesterday due to what was felt to be possibly a cellulitis. They place her on doxycycline by mouth and discharge her home. She definitely was not admitted. With that being said she states she has had more discomfort which has been unusual for her even compared to prior times and she's had infections.08/12/17 on evaluation today patient appears to still be doing fairly well in regard to the draining areas/weeping areas at this point. With that being said she unfortunately did go to the ER yesterday due to what was felt to be possibly a cellulitis. They place her on doxycycline by mouth and discharge her home. She definitely was not admitted. With that being said she states she has had more discomfort which has been unusual for her even compared to prior times and she's had infections. 08/19/17 put evaluation today patient tells me that she's been having a lot of what sounds to be neuropathic type pain in regard to her left lower extremity. She has been using over-the-counter topical bins again which some believe. That in order to apply the she actually remove the wrap we put on her last Wednesday on Thursday. Subsequently she has not had anything on compression wise since that time. The good news is a lot of the weeping areas appear to have closed at this point again I believe she would do better with compression but we are struggling to get her to actually use what she needs to at this point. No fevers, chills, nausea, or vomiting noted at this time. 09/03/17 on evaluation today patient appears to be doing okay in regard to her lower extremities in regard to the lymphedema and weeping. Fortunately she does not seem to show any signs of infection at this point she does have a little bit of weeping occurring in the right medial malleolus area. With that being said this does not appear to be too significant which is good news. 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 She has severe skin damage secondary to both of these features involving the dorsal left foot and medial left ankle and lower leg. Still has open areas in the left anterior foot. The area on the right closed over. She uses her own juxta light stockings. She does not have an arterial issue 09/16/17 on evaluation today patient actually appears to be doing excellent in regard to her bilateral lower extremity swelling. The Juxta-Lite compression wrap seem to be doing very well for her. She has not however been using the portion that goes over her foot. Her left foot still is draining a  little bit not nearly as significant as it has been in the past but still I do believe that she likely needs to utilize the full wrap including the foot portion of this will improve as well. She also has been apparently putting on a significant amount of Vaseline which also think is not helpful for her. I recommended that if she feels she needs something for moisturizer Eucerin will probably be better. 09/30/17 on evaluation today patient presents with several new open areas in regard to her left lower extremity although these appear to be minimal and mainly seem to be more moisture breakdown than anything. Fortunately she does not seem to have any evidence of infection which is great news. She has been tolerating the dressing changes without complication we are using silver alginate on the foot she has been using AB pads to have the legs and using her Juxta-Lite compression which seems to be controlling her swelling very well. Overall I'm pleased with the poor way she has progressed. 10/14/17 on evaluation today patient appears to be doing better in regard to her left lower extremity areas of weeping. She does still have some discomfort although in general this does not appear to be as macerated and I think it  is progressing nicely. I do think she still needs to wear the foot portion of her Juxta-Lite in order to get the most benefit from the wrap obviously. She states she understands. Fortunately there does not appear to be evidence of infection at this time which is great news. 10/28/17 on evaluation today patient appears to be doing excellent in regard to her left lower extremity. She has just a couple areas that are still open and seem to be causing any trouble whatsoever. For that reason I think that she is definitely headed in the right direction the spots are very tiny compared to what we have been dealing with in the past. 11/11/17 on evaluation today patient appears to have a right lateral lower extremity ulcer that has opened since I last saw her. She states this is where the home health nurse that was coming out remove the dressing without wetting the alginate first. Nonetheless I do not know if this is indeed the case or not but more importantly we have not ordered home help to be coming out for her wounds at all. I'm unsure as to why they are coming out and we're gonna have to check on this and get things situated in that regard. With that being said we currently really do not need them to be coming out as the patient has been taking care of her leg herself without complication and no issues. In fact she was doing much better prior to nursing coming out. 11/25/17 on evaluation today patient actually appears to be doing fairly well in regard to her left lower extremity swelling. In fact she has very little area of weeping at this point there's just a small spot on the lateral portion of her right leg that still has me just a little bit more concerned as far as wanting to see this clear up before I discharge her to caring for this at home. Nonetheless overall she has made excellent progress. 12/09/17 on evaluation today patient appears to be doing rather well in regard to her lower extremity  edema. She does have some weeping still in the left lower extremity although the big area we were taking care of two weeks ago actually has closed and she has another  area of weeping on the left lower extremity immediately as well is the top of her foot. She does not currently have lymphedema pumps she has been wearing her compression daily on a regular basis as directed. With that being said I think she may benefit from lymphedema pumps. 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 We had a very difficult time getting and keeping this under control. 12/23/17 on evaluation today patient actually appears to be doing a little bit more poorly in regard to her bilateral lower extremities. She has been tolerating the Juxta-Lite compression wraps. Unfortunately she has two new ulcers on the right lower extremity and left lower Trinity ulceration seems to be larger. Obviously this is not good news. She has been tolerating the dressings without complication. 12/30/17 on evaluation today patient actually appears to be doing much better in regard to her bilateral lower extremity edema. She continues to have some issues with ulcerations and in fact there appears to be one spot on each leg where the wrap may have caused a little bit of a blister which is subsequently opened up at this point is given her pain. Fortunately it does not appear to be any evidence of infection which is good news. No fevers chills noted. 01/13/18 on evaluation today patient appears to be doing rather well in regard to her bilateral lower extremities. The dressings did get kind of stuck as far as the wound beds are concerned but again I think this is mainly due to the fact that she actually seems to be showing signs of healing which is good news. She's not having as much drainage  therefore she was having more of the dressing sticking. Nonetheless overall I feel like her swelling is dramatically down compared to previous. 01/20/18 on evaluation today patient unfortunately though she's doing better in most regards has a large blister on the left anterior lower extremity where she is draining quite significantly. Subsequently this is going to need debridement today in order to see what's underneath and ensure she does not continue to trapping fluid at this location. Nonetheless No fevers, chills, nausea, or vomiting noted at this time. 01/27/18 on evaluation today patient appears to be doing rather well at this point in regard to her right lower extremity there's just a very small area that she still has open at this point. With that being said I do believe that she is tolerating the compression wraps very well in making good progress. Home health is coming out at this point to see her. Her left lower extremity on the lateral portion is actually what still mainly open and causing her some discomfort for the most part 02/10/18 on evaluation today patient actually appears to be doing very well in regard to her right lower extremity were all the ulcers appear to be completely close. In regard to the left lower extremity she does have two areas still open and some leaking from the dorsal surface of her foot but this still seems to be doing much better to me in general. 02/24/18 on evaluation today patient actually appears to be doing much better in regard to her right lower extremity this is still completely healed. Her left lower extremity is also doing much better fortunately she has no evidence of infection. The one area that is gonna require some debridement is still  on the left anterior shin. Fortunately this is not hurting her as badly today. 03/10/18 on evaluation today patient appears to be doing better in some regards although she has a little bit more open area on the dorsal  foot and she also has some issues on the medial portion of the left lower extremity which is actually new and somewhat deep. With that being said there fortunately does not appear to be any significant signs of infection which is good news. No fevers, chills, nausea, or vomiting noted at this time. In general her swelling seems to be doing fairly well which is good news. 03/31/18 on evaluation today patient presents for follow-up concerning her left lower extremity lymphedema. Unfortunately she has been doing a little bit more poorly since I last saw her in regard to the amount of weeping that she is experiencing. She's also having some increased pain in the anterior shin location. Unfortunately I do not feel like the patient is making such good progress at this point a few weeks back she was definitely doing much better. 04/07/18 on evaluation today patient actually appears to be showing some signs of improvement as far as the left lower extremity is concerned. She has been tolerating the dressing changes and it does appear that the Drawtex did better for her. With that being said unfortunately home health is stating that they cannot obtain the Drawtex going forward. Nonetheless we're gonna have to check and see what they may be able to get the alginate they were using was getting stuck in causing new areas of skin being pulled all that with and subsequently weep and calls her to worsen overall this is the first time we've seen improvement at this time. 04/14/18 on evaluation today patient actually appears to be doing rather well at this point there does not appear to be any evidence of infection at this time and she is actually doing excellent in regard to the weeping in fact she almost has no openings remaining even compared to just last week this is a dramatic improvement. No fevers chills noted 04/21/18 evaluation today patient actually appears to be doing very well. She in fact is has a small  area on the posterior lower extremity location and she has a small area on the dorsal surface of her foot that are still open both of which are very close to closing. We're hoping this will be close shortly. She brought her Juxta-Lite wrap with her today hoping that would be able to put her in it unfortunately I don't think were quite at that point yet but we're getting closer. 04/28/18 upon evaluation today patient actually appears to be doing excellent in regard to her left lower extremity ulcer. In fact the region on the posterior lower extremity actually is much smaller than previously noted. Overall I'm very happy with the progress she has made. She again did bring her Juxta-Lite although we're not quite ready for that yet. 05/11/18 upon evaluation today patient actually appears to be doing in general fairly well in regard to her left lower Trinity. The swelling is very well controlled. With that being said she has a new area on the left anterior lower extremity as well as between the first and second toes of her left foot that was not present during the last evaluation. The region of her posterior left lower extremity actually appears to be almost completely healed. To be honest I'm very pleased with the way that stands. Nonetheless I do believe  that the lotion may be keeping the area to moist as far as her legs are concerned subsequently I'm gonna consider discontinuing that today. 05/26/18 on evaluation today patient appears to be doing rather well in regard to her left lower should be ulcers. In fact everything appears to be close except for a very small area on the left posterior lower extremity. Fortunately there does not appear to be any evidence of infection at this time. Overall very pleased with her progress. 06/02/18 and evaluation today patient actually appears to be doing very well in regard to her lower extremity ulcers. She has one small area that still continues to weep that I  think may benefit her being able to justify lotion and user Juxta-Lite wraps versus continued to wrap her. Nonetheless I think this is something we can definitely look into at this point. 06/23/18 on evaluation today patient unfortunately has openings of her bilateral lower extremities. In general she seems to be doing much worse than when I last saw her just as far as her overall health standpoint is concerned. She states that her discomfort is mainly due to neuropathy she's not having any other issues otherwise. No fevers, chills, nausea, or vomiting noted at this time. 06/30/18 on evaluation today patient actually appears to be doing a little worse in regard to her right lower extremity her left lower extremity of doing fairly well. Fortunately there is no sign of infection at this time. She has been tolerating the dressing changes without complication. Home health did not come out like they were supposed to for the appropriate wrap changes. They stated that they never received the orders from [place] which were fax. Nonetheless we will send a copy of the orders with the patient today as well. 07/07/18 on evaluation today patient appears to be doing much better in regard to lower extremities. She still has several openings bilaterally although since I last saw her her legs did show obvious signs of infection when she later saw her nurse. Subsequently a culture was obtained and she is been placed on Bactrim and Keflex. Fortunately things seem to be looking much better it does appear she likely had an infection. Again last week we'd even discussed it but again there really was not any obvious sign that she had infection therefore we held off on the antibiotics. Nonetheless I'm glad she's doing better today. 07/14/18 on evaluation today patient appears to be doing much better regarding her bilateral lower Trinity's. In fact on the right lower for me there's nothing open at this point there are some dry skin  areas at the sites where she had infection. Fortunately there is no evidence of systemic infection which is excellent news. No fevers chills noted 07/21/18 on evaluation today patient actually appears to be doing much better in regard to her left lower extremity ulcers. She is making good progress and overall I feel like she's improving each time I see her. She's having no pain I do feel like the infection is completely resolved which is excellent news. No fevers, chills, nausea, or vomiting noted at this time. 07/28/18 on evaluation today patient appears to be doing very well in regard to her left lower [employer]. Everything seems to be showing signs of improvement which is excellent news. Overall very pleased with the progress that has been made. Fortunately there's no evidence of active infection at this time also excellent news. 08/04/18 on evaluation today patient appears to be doing more poorly in regard  to her bilateral lower extremities. She has two new areas open up on the right and these were completely closed as of last week. She still has the two spots on the left which in my pinion seem to be doing better. Fortunately there's no evidence of infection again at this point. 08/11/18 on evaluation today patient actually appears to be doing very well in regard to her bilateral lower Trinity wounds that all seem to be doing better and are measures smaller today. Fortunately there's no signs of infection. No fevers, chills, nausea, or vomiting noted at this time. 08/18/18 on evaluation today patient actually appears to be doing about the same inverter bilateral lower extremities. She continues to have areas that blistering open as was drain that fortunately nothing too significant. Overall I feel like Drawtex may have done better for her however compared to the collagen. 08/25/18 on evaluation today patient appears to be doing a little bit more poorly today even compared to last time I saw  her. Again I'm not exactly sure why she's making worse progress over the past several weeks. I'm beginning to wonder if there is some kind of underlying low level infection causing this issue. I did actually take a culture from the left anterior lower extremity but it was a new wound draining quite a bit at this point. Unfortunately she also seems to be having more pain which is what also makes me worried about the possibility of infection. This is despite never erythema noted at this point. 09/01/18 on evaluation today patient actually appears to be doing a little worse even compared to last week in regard to bilateral lower extremities. She did go to the hospital on the 16th was given a dose of IV Zosyn and then discharged with a recommendation to continue with the Bactrim that I previously prescribed for her. Nonetheless she is still having a lot of discomfort she tells me as well at this time. This is definitely unfortunate. No fevers, chills, nausea, or vomiting noted at this time. 09/08/18 on evaluation today patient's bilateral lower extremities actually appear to be shown signs of improvement which is good news. Fortunately there does not appear to be any signs of active infection I think the anabiotic is helping in this regard. Overall I'm very pleased with how she is progressing. 09/15/18 patient was actually seen in ER yesterday due to her legs as well unfortunately. She states she's been having a lot of pain and discomfort as well as a lot of drainage. Upon inspection today the patient does have a lot of swelling and drainage I feel like this is more related to lymphedema and poor fluid control than it is to infection based on what I'm seeing. The physician in the emergency department also doubted that the patient was having a significant infection nonetheless I see no evidence of infection obvious at this point although I do see evidence of poor fluid control. She still not using a  compression pumps, she is not elevating due to her lift chair as well as her hospital bed being broken, and she really is not keeping her legs up as much as they should be and also has been taking off her wraps. All this combined I think has led to poor fluid control and to be honest she may be somewhat volume overloaded in general as well. I recommend that she may need to contact your physician to see if a prescription for a diuretic would be beneficial in their opinion.  As long as this is safe I think it would likely help her. 09/29/18 on evaluation today patient's left lower extremity actually appears to be doing quite a bit better. At least compared to last time that I saw her. She still has a large area where she is draining from but there's a lot of new skin speckled trout and in fact there's more new skin that there are open areas of weeping and drainage at this point. This is good news. With regard to the right lower extremity this is doing much better with the only open area that I really see being a dry spot on the right lateral ankle currently. Fortunately there's no signs of active infection at this time which is good news. No fevers, chills, nausea, or vomiting noted at this time. The patient seems somewhat stressed and overwhelmed during the visit today she was very lethargic as such. She does and she is not taking any pain medications at this point. Apparently according to her husband are also in the process of moving which is probably taking its toll on her as well. 10/06/18 on evaluation today patient appears to be doing rather well in regard to her lower extremities compared to last evaluation. Fortunately there's no signs of active infection. She tells me she did have an appointment with her primary. Nonetheless he was concerned that the wounds were somewhat deep based on pictures but we never actually saw her legs. She states that he had her somewhat worried due to the fact that  she was fearing now that she was [place] have to have an amputation. With that being said based on what I'm seeing check she looks better this week that she has the last two times I've seen her with much less drainage I'm actually pleased in this regard. That doesn't mean that she's out of the water but again I do not think what the point of talking about education at all in regard to her leg. She is very happy to hear this. She is also not having as much pain as she was having last week. 10/13/18 unfortunately on evaluation today patient still continues to have a significant amount of drainage she's not letting home health actually apply the compression dressings at this point. She's trying to use of Juxta-Lite of the top of Kerlex and the second layer of the three layer compression wrap. With that being said she just does not seem to be making as good a progress as I would expect if she was having the compression applied and in place on a regular basis. No fevers, chills, nausea, or vomiting noted at this time. 10/20/18 on evaluation today patient appears to be doing a little better in regard to her bilateral lower extremity ulcers. In fact the right lower extremity seems to be healed she doesn't even have any openings at this point left lower extremity though still somewhat macerated seems to be showing signs of new skin growth at multiple locations throughout. Fortunately there's no evidence of active infection at this time. No fevers, chills, nausea, or vomiting noted at this time. 10/27/18 on evaluation today patient appears to be doing much better in regard to her left lower Trinity ulcer. She's been tolerating the laptop complication and has minimal drainage noted at this point. Fortunately there's no signs of active infection at this time. No fevers, chills, nausea, or vomiting noted at this time. 11/03/18 on evaluation today patient actually appears to be doing excellent in regard to  her left  lower extremity. She is having very little drainage at this point there does not appear to be any significant signs of infection overall very pleased with how things have gone. She is likewise extremely pleased still and seems to be making wonderful progress week to week. I do believe antibiotics were helpful for her. Her primary care provider did place on amateur clean since I last saw her. 11/17/18 on evaluation today patient appears to be doing worse in regard to her bilateral lower extremities at this point. She is been tolerating the dressing changes without complication. With that being said she typically takes the Coban off fairly quickly upon arriving home even after being seen here in the clinic and does not allow home health reapply command as part of the dressing at home. Therefore she said no compression essentially since I last saw her as best I can tell. With that being said I think it shows and how much swelling she has in the open wounds that are noted at this point. Fortunately there's no signs of infection but unfortunately if she doesn't get this under control I think she will end up with infection and more significant issues. 11/24/18 on evaluation today patient actually appears to be doing somewhat better in regard to her bilateral lower extremities. 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 Nonetheless that is doing better currently so she should be able to attempt reinitiating the lymphedema pumps at this time. No fevers, chills, nausea, or vomiting noted at this time. 12/01/18 upon evaluation today patient's left lower extremity appears to be doing quite well unfortunately her right lower extremity is not doing nearly as well. She has been tolerating the dressing changes without complication unfortunately she did not keep a wrap on the  right at this time. Nonetheless I believe this has led to increased swelling and weeping in the world is actually much larger than during the last evaluation with her. 12/08/18 on evaluation today patient appears to be doing about the same at this point in regard to her right lower extremity. There is some more palatable to touch I'm concerned about the possibility of there being some infection although I think the main issue is she's not keeping her compression wrap on which in turn is not allowing this area to heal appropriately. 12/22/18 on evaluation today patient appears to be doing better in regard to left lower extremity unfortunately significantly worse in regard to the right lower extremity. The areas of blistering and necrotic superficial tissue have spread and again this does not really appear to be signs of infection and all she just doesn't seem to be doing nearly as well is what she has been in the past. Overall I feel like the Augmentin did absolutely nothing for her she doesn't seem to have any infection again I really didn't think so last time either is more of a potential preventative measure and hoping that this would make some difference but I think the main issue is she's not wearing her compression. She tells me she cannot wear the [place] been we put on she takes it off pretty much upon getting home. Subsequently she worshiped Juxta-Lite when I questioned her about how often she wears it this is no more than three hours a day obviously that leaves 21  hours that she has no compression and this is obviously not doing well for her. Overall I'm concerned that if things continue to worsen she is at great risk of both infection as well as losing her leg. 01/05/19 on evaluation today patient appears to be doing well in regard to her left lower extremity which he is allowing [place] to wrap and not so well with regard to her right lower extremity which she is not allowing [place] to really wrap  and keep the wrap on. She states that it hurts too badly whenever it's wrapped and she ends up having to take it off. She's been using the Juxta-Lite she tells me up to six hours a day although I question whether or not that's really been the case to be honest. Previously she told me three hours today nonetheless obviously the legs as long as the wrap is doing great when she is not is doing much more poorly. 01/12/2019 on evaluation today patient actually appears to be doing a little better in my opinion with regard to her right lower extremity ulcer. She has a small open area on the left lower extremity unfortunately but again this I think is part of the normal fluctuation of what she is going to have to expect with regard to her legs especially when she is not using her lymphedema pumps on a regular basis. Subsequently based on what I am seeing today I think that she does seem to be doing slightly better with regard to her right lower extremity she did see her primary care provider on Monday they felt she had an infection and placed her on 2 antibiotics. Both Cipro and clindamycin. Subsequently again she seems possibly to be doing a little bit better in regards to the right lower extremity she also tells me however she has been wearing the compression wrap over the past week since I spoke with her as well that is a Kerlix and Coban wrap on the right. No fevers, chills, nausea, vomiting, or diarrhea. 01/19/2019 on evaluation today patient appears to be doing better with regard to her bilateral lower extremities especially the right. I feel like the compression has been beneficial for her which is great news. She did get a call from her primary care provider on her way here today telling her that she did have methicillin-resistant Staphylococcus aureus and he was calling in a couple new antibiotics for her including a ointment to be applied she tells me 3 times a day. With that being said this sounds  like likely to be Bactroban which I think could be applied with each dressing/wrap change but I would not be able to accommodate her applying this 3 times a day. She is in agreement with the least doing this we will add that to her orders today. 01/26/2019 on evaluation today patient actually appears to be doing much better with regard to her right lower extremity. Her left lower extremity is also doing quite well all things considering. Fortunately there is no evidence of active infection at this time. No fevers, chills, nausea, vomiting, or diarrhea. 02/02/2019 on evaluation today patient appears to be doing much better compared to her last evaluation. Little by little off like her right leg is returning more towards normal. There does not appear to be any signs of active infection and overall she seems to be doing quite well which is great news. I am very pleased in this regard. No fevers, chills, nausea, vomiting, or diarrhea. 02/09/2019  upon evaluation today patient appears to be doing better with regard to her bilateral lower extremities. She has been tolerating the dressing changes without complication. Fortunately there is no signs of active infection at this time. No fevers, chills, nausea, vomiting, or diarrhea. 02/23/2019 on evaluation today patient actually appears to be doing quite well with regard to her bilateral lower extremities. She has been tolerating the dressing changes without complication. She is even used her pumps one time and states that she really felt like it felt good. With that being said she seems to be in good spirits and her legs appear to be doing excellent. 03/09/2019 on evaluation today patient appears to be doing well with regard to her right lower extremity there are no open wounds at this time she is having some discomfort but I feel like this is more neuropathy than anything. With regard to her left lower extremity she had several areas scattered around that she  does have some weeping and drainage from but again overall she does not appear to be having any significant issues and no evidence of infection at this time which is good news. 03/23/2019 on evaluation today patient appears to be doing well with regard to her right lower extremity which she tells me is still close she is using her juxta light here. Her left lower extremity she mainly just has an area on the foot which is still slightly draining although this also is doing great. Overall very pleased at this time. 04/06/2019 patient appears to be doing a little bit worse in regard to her left lower extremity upon evaluation today. She feels like this could be becoming infected again which she had issues with previous. Fortunately there is no signs of systemic infection but again this is always a struggle with her with her legs she will go from doing well to not so well in a very short amount of time. 04/20/2019 on evaluation today patient actually appears to be doing quite well with regard to her right lower extremity I do not see any signs of active infection at this time. Fortunately there is no fever chills noted. She is still taking the antibiotics which I prescribed for her at this point. In regard to the left lower extremity I do feel like some of these areas are better although again she still is having weeping from several locations at this time. 04/27/2019 on evaluation today patient appears to be doing about the same if not slightly worse in regard to her left lower extremity ulcers. She tells me when questioned that she has been sleeping in her Hoveround chair in fact she tells me she falls asleep without even knowing it. I think she is spending a whole lot of time in the chair and less time walking and moving around which is not good for her legs either. On top of that she is in a seated position which is also the worst position she is not really elevating her legs and she is also not  using her lymphedema pumps. All this is good to contribute to worsening of her condition in general. 05/18/2019 on evaluation today patient appears to be doing well with regard to her lower extremity on the right in fact this is showing no signs of any open wounds at this time. On the left she is continuing to have issues with areas that do drain. Some of the regions have healed and there are couple areas that have reopened. She did go to  the ER per the patient according to recommendations from the home health nurse due to what she was seen when she came out on 05/13/2019. Subsequently she felt like the patient needed to go to the hospital due to the fact that again she was having "milky white discharge" from her leg. Nonetheless she had and then was placed on doxycycline and subsequently seems to be doing better. 06/01/2019 upon evaluation today patient appears to be doing really in my opinion about the same. I do not see any signs of active infection which is good news. Overall she still has wounds over the bilateral lower extremities she has reopened on the right but this appears to be more of a crack where there is weeping/edema coming from the region. I do not see any evidence of infection at either site based on what I visualized today. 07/13/2019 upon evaluation today patient appears to be doing a little worse compared to last time I saw her. She since has been in the hospital from 06/21/2019 through 06/29/2019. This was secondary to having Covid. During that time they did apply lotion to her legs which unfortunately has caused her to develop a myriad of open wounds on her lower extremities. Her legs do appear to be doing better as far as the overall appearance is concerned but nonetheless she does have more open and weeping areas. 07/27/2019 upon evaluation today patient appears to be doing more poorly to be honest in regard to her left lower extremity in particular. There is no signs of  systemic infection although I do believe she may have local infection. She notes she has been having a lot of blue/green drainage which is consistent potentially with Pseudomonas. That may be something that we need to consider here as well. The doxycycline does not seem to have been helping. 08/03/2019 upon evaluation today patient appears to be doing a little better in my opinion compared to last week's evaluation. Her culture I did review today and she is on appropriate medications to help treat the Enterobacter that was noted. Overall I feel like that is good news. With that being said she is unfortunately continuing to have a lot of drainage and though it is doing better I still think she has a long ways to go to get things dried up in general. Fortunately there is no signs of systemic infection. 08/10/2019 upon evaluation today patient appears to be doing may be slightly better in regard to her left lower extremity the right lower extremity is doing much better. Fortunately there is no signs of infection right now which is good news. No fevers, chills, nausea, vomiting, or diarrhea. 08/24/2019 on evaluation today patient appears to be doing slightly better in regard to her lower extremities. The left lower extremity seems to be healed the right lower extremity is doing better though not completely healed as far as the openings are concerned. She has some generalized issues here with edema and weeping secondary to her lymphedema though again I do believe this is little bit drier compared to prior weeks evaluations. In general I am very pleased with how things seem to be progressing. No fevers, chills, nausea, vomiting, or diarrhea. 08/31/2019 upon evaluation today patient actually seems to making some progress here with regard to the left lower extremity in particular. She has been tolerating the dressing changes without complication. Fortunately there is no signs of active infection at this time.  No fevers, chills, nausea, vomiting, or diarrhea. She did see Dr. Doren Custard  and he did note that she did have a issue with the left great saphenous vein and the small saphenous vein in the leg. With that being said he was concerned about the possibility of laser ablation not being extremely successful. He also mentioned a small risk of DVT associated with the procedure. However if the wounds do not continue to improve he stated that that would probably be the way to go. Fortunately the patient's legs do seem to be doing much better. 09/07/2019 upon evaluation today patient appears to be doing better with regard to her lower extremities. She has been tolerating the dressing changes without complication. With that being said she is showing signs of improvement and overall very pleased. There are some areas on her leg that I think we do need to debride we discussed this last week the patient is in agreement with doing that as long as it does not hurt too badly. 09/14/2019 upon evaluation today patient appears to be doing decently well with regard to her left lower extremity. She is not having near as much weeping as she has had in the past things seem to be drying up which is good news. There is no signs of active infection at this time. Electronic Signature(s) Signed: 09/14/2019 10:43:08 AM By: [name] PA-C Entered By: [name] on 09/14/2019 10:43:07 -------------------------------------------------------------------------------- Physical Exam Details Patient Name: Date of Service: ADRI, SCHLOSS 09/14/2019 9:30 AM Medical Record [id number] Patient Account Number: [id number] Date of Birth/Sex: Treating RN: [birth year] (69 y.o. [name] Primary Care Provider: [name] Other Clinician: Referring Provider: Treating Provider/Extender:Stone III, [name], FRED Weeks in Treatment: 110 Constitutional Well-nourished and well-hydrated in no acute  distress. Respiratory normal breathing without difficulty. Psychiatric this patient is able to make decisions and demonstrates good insight into disease process. Alert and Oriented x 3. pleasant and cooperative. Notes Patient's wound bed currently showed signs of good granulation at this time. Fortunately there is no signs of active infection currently and I do feel like her legs are drying up quite nicely which is good news. Overall I am extremely pleased with the progress that the patient has made. Electronic Signature(s) Signed: 09/14/2019 10:43:24 AM By: [name] PA-C Entered By: [name] on 09/14/2019 10:43:23 -------------------------------------------------------------------------------- Physician Orders Details Patient Name: Date of Service: [name]. 09/14/2019 9:30 AM Medical Record [id number] Patient Account Number: [id number] Date of Birth/Sex: Treating RN: [birth year] (68 y.o. [name] Primary Care Provider: [name] Other Clinician: Referring Provider: Treating Provider/Extender:Stone III, [name], FRED Weeks in Treatment: [phone] Verbal / Phone Orders: No Diagnosis Coding ICD-10 Coding Code Description E11.622 Type 2 diabetes mellitus with other skin ulcer I89.0 Lymphedema, not elsewhere classified I87.331 Chronic venous hypertension (idiopathic) with ulcer and inflammation of right lower extremity I87.332 Chronic venous hypertension (idiopathic) with ulcer and inflammation of left lower extremity L97.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 leg with fat layer exposed L97.822 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 I10 Essential (primary) hypertension E66.01 Morbid (severe) obesity due to excess calories F41.8 Other specified anxiety disorders R53.1 Weakness Follow-up  Appointments Return Appointment in 1 week. Dressing Change Frequency Wound #61 Left,Circumferential Lower Leg Change dressing three times week. Wound #64 Left,Dorsal Foot Change dressing three times week. Skin Barriers/Peri-Wound Care Barrier cream - zinc oxide cream to any macerated areas  Moisturizing lotion - to right leg daily Wound Cleansing Clean wound with Wound Cleanser - all wounds May shower with protection. Primary Wound Dressing Wound #61 Left,Circumferential Lower Leg Calcium Alginate with Silver Wound #64 Left,Dorsal Foot Calcium Alginate with Silver Secondary Dressing Wound #61 Left,Circumferential Lower Leg Dry Gauze - all wounds ABD pad - as needed Zetuvit or Kerramax - as needed Wound #64 Left,Dorsal Foot Dry Gauze - all wounds ABD pad - as needed Edema Control 3 Layer Compression System - Left Lower Extremity Avoid standing for long periods of time - walking is encouraged Elevate legs to the level of the heart or above for 30 minutes daily and/or when sitting, a frequency of: - do not sleep in chair with feet dangling, MUST elevate legs while sitting Exercise regularly Support Garment 20-30 mm/Hg pressure to: - Juxtalite to right lower leg daily Segmental Compressive Device. - lymphedema pumps 60 minutes 1- 2 times per day Off-Loading Turn and reposition every 2 hours Additional Orders / Instructions Follow Nutritious Diet - To include vitamin A, vitamin C, and Zinc along with increased protein intake. [place] skilled [facility] for wound care. - [facility] Electronic Signature(s) Signed: 09/14/2019 6:50:57 PM By: [name] RN, BSN Signed: 09/14/2019 10:08:08 PM By: [name] PA-C Entered By: [name] on 09/14/2019 10:40:12 -------------------------------------------------------------------------------- Problem List Details Patient Name: Date of Service: [name]. 09/14/2019 9:30 AM Medical Record  [id number] Patient Account Number: [id number] Date of Birth/Sex: Treating RN: Nov 30, 1950 (69 y.o. [name] Primary Care Provider: [name] Other Clinician: Referring Provider: Treating Provider/Extender:Stone III, [name], FRED Weeks in Treatment: 110 Active Problems ICD-10 Evaluated Encounter Code Description Active Date Today Diagnosis E11.622 Type 2 diabetes mellitus with other skin ulcer 08/05/2017 No Yes I89.0 Lymphedema, not elsewhere classified 08/05/2017 No Yes I87.331 Chronic venous hypertension (idiopathic) with ulcer 08/05/2017 No Yes and inflammation of right lower extremity I87.332 Chronic venous hypertension (idiopathic) with ulcer 08/05/2017 No Yes and inflammation of left lower extremity L97.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 08/05/2017 No Yes leg with fat layer exposed L97.822 Non-pressure chronic ulcer of other part of left lower 08/05/2017 No Yes leg with fat layer exposed L97.522 Non-pressure chronic ulcer of other part of left foot 06/01/2019 No Yes with fat layer exposed I10 Essential (primary) hypertension 08/05/2017 No Yes E66.01 Morbid (severe) obesity due to excess calories 08/05/2017 No Yes F41.8 Other specified anxiety disorders 08/05/2017 No Yes R53.1 Weakness 08/05/2017 No Yes Inactive Problems Resolved Problems Electronic Signature(s) Signed: 09/14/2019 9:36:00 AM By: [name] PA-C Entered By: [name] on 09/14/2019 09:36:00 -------------------------------------------------------------------------------- Progress Note Details Patient Name: Date of Service: [name]. 09/14/2019 9:30 AM Medical Record [id number] Patient Account Number: [id number] Date of Birth/Sex: Treating RN: 1950/11/05 (68 y.o. [name] Primary Care Provider: [name] Other Clinician: Referring Provider: Treating Provider/Extender:Stone III, [name], FRED Weeks in Treatment:  110 Subjective Chief Complaint Information obtained from Patient Bilateral reoccurring LE ulcers History of Present Illness (HPI) this patient has been seen a couple of times before and returns with recurrent problems to her right and left lower extremity with swelling and weeping ulcerations due to not wearing her compression stockings which she had been advised to do during her last discharge, at the end of June 2018. During her last visit the patient had had normal arterial blood flow and her venous reflux study did not necessitate any surgical intervention. She was recommended compression and elevation  and wound care. After prolonged treatment the patient was completely healed but she has been noncompliant with wearing or compressions.. She was here last week with an outpatient return visit planned but the patient came in a very poor general condition with altered mental status and was rushed to the ER on my request. With a history of hypertension, diabetes, TIA and right-sided weakness she was set up for an MRI on her brain and cervical spine and was sent to [facility]. Getting an MRI done was very difficult but once the workup was done she was found not to have any spinal stenosis, epidural abscess or hematoma or discitis. This was radiculopathy to be treated as an outpatient and she was given a follow-up appointment. Today she is feeling much better alert and oriented and has come to reevaluate her bilateral lower extremity lymphedema and ulceration 03/25/2017 -- she was admitted to the hospital on 03/16/2017 and discharged on 03/18/2017 with left leg cellulitis and ulceration. She was started on vancomycin and Zosyn and x-ray showed no bony involvement. She was treated for a cellulitis with IV antibiotics changed to Rocephin and Flagyl and was discharged on oral Keflex and doxycycline to complete a 7 day course. Last hemoglobin A1c was 7.1 and her other ailments including hypertension  got asthma were appropriately treated. 05/06/2017 -- she is awaiting the right size of compression stockings from [place] but other than that has been doing well. ====== Old notes 69 year old patient was seen one time last October and was lost to follow-up. She has recurrent problems with weeping and ulceration of her left lower extremity and has swelling of this for several years. It has been worse for the last 2 months. Past medical history is significant for diabetes mellitus type 2, hypertension, gout, morbid obesity, depressive disorders, hiatal hernia, migraines, status post knee surgery, risk of a cholecystectomy, vaginal hysterectomy and breast biopsy. She is not a smoker. As noted before she has never had a venous duplex study and an arterial ABI study was attempted but the left lower extremity was noncompressible 10/01/2016 -- had a lower extremity venous duplex reflux evaluation which showed no evidence of deep vein reflux in the right or left lower extremity, and no evidence of great saphenous vein reflux more than 500 ms in the right or left lower extremity, and the left small saphenous vein is incompetent but no vascular consult was recommended. review of her electronic medical records noted that the ABI was checked in July 2017 where the right ABI was normal limits and the left ABI could not be ascertained due to pain with cuff pressure but the waveforms are within normal limits. her arterial duplex study scheduled for April 27. 10/08/2016 -- the patient has various reasons for not having a compression on and for the last 3 days she has had no compression on her left lower extremity either due to pain or the lack of nursing help. She does not use her juxta lites either. 10/15/2016 -- the patient did not keep her appointment for arterial duplex study on April 27 and I have asked her to reschedule this. Her pain is out of proportion with the physical findings and she continuously  fails to wear a compression wraps and cuts them off because she says she cannot tolerate the pain. She does not use her juxta lites either. 10/22/2016 -- he has rescheduled her arterial duplex study to May 21 and her pain today is a bit better. She has not been wearing  her juxta lites on her right lower extremity but now understands that she needs to do this. She did tolerate the to press compression wrap on her left lower extremity 10/29/2016 --arterial duplex study is scheduled for next week and overall she has been tolerating her compression wraps and also using her juxta lites on her right lower extremity 11/05/2016 -- the right ABI was 0.95 the left was 1.03. The digit TBI is on the right was 0.83 on the left was 0.92 and she had biphasic flow through these vessels. The impression was that of normal lower extremity arterial study. 11/12/2016 -- her pain is minimal and she is doing very well overall. 11/26/2016 -- she has got juxta lites and her insurance will not pay for additional dual layer compression stockings. She is going to order some from [place]. 05/12/2017 -- her juxta lites are very old and too big for her and these have not been helping with compression. She did get 20-30 mm compression stockings from [place] but she and her husband are unable to put these on. I believe she will benefit from bilateral Extremit-ease, compression stockings and we will measure her for these today. 05/20/2017 -- lymphedema on the left lower extremity has increased a lot and she has a open ulceration as a result of this. The right lower extremity is looking pretty good. She has decided to by the compression stockings herself and will get reimbursed by the home health, at a later date. 05/27/2017 -- her sciatica is bothering her a lot and she thought her left leg pain was caused due to the compression wrap and hence removed it and has significant lymphedema. There is no inflammation on this  left lower extremity. 06/17/17 on evaluation today patient appears to be doing very well and in fact is completely healed in regard to her ulcerations. Unfortunately however she does have continued issues with lymphedema nonetheless. We did order compression garments for her unfortunately she states that the size that she received were large although we ordered medium. Obviously this means she is not getting the optimal compression. She does not have those with her today and therefore we could not confirm and contact the company on her behalf. Nonetheless she does state that she is going to have her husband bring them by tomorrow so that we can verify and then get in touch with the company. No fevers, chills, nausea, or vomiting noted at this time. Overall patient is doing better otherwise and I'm pleased with the progress she has made. 07/01/17 on evaluation today patient appears to be doing very well in regard to her bilateral lower extremity she does not have any openings at this point which is excellent news. Overall I'm pleased with how things have progressed up to this time. Since she is doing so well we did order her compression which we are seeing her today to ensure that it fits her properly and everything is doing well in that regard and then subsequently she will be discharged. ============ Old Notes: 03/31/16 patient presents today for evaluation concerning open wounds that she has over the left medial ankle region as well as the left dorsal foot. She has previously had this occur although it has been healed for a number of months after having this for about a year prior until her hospitalization on 01/05/16. At that point in time it appears that she was admitted to the hospital for left lower extremity cellulitis and was placed on vancomycin and Zosyn at that  point. Eventually upon discharge on January 15, 2016 she was placed on doxycycline at that point in time. Later on 03/27/16  positive wound culture growing Escherichia coli this was switched to amoxicillin. Currently she tells me that she is having pain radiated to be a 7 out of 10 which can be as high as 10 out of 10 with palpation and manipulation of the wound. This wound appears to be mainly venous in nature due to the bilateral lower extremity venous stasis/lymphedema. This is definitely much worse on her left than the right side. She does have type 1 diabetes mellitus, hypertension, morbid obesity, and is wheelchair dependent.during the course of the hospital stay a blood culture was also obtained and fortunately appeared negative. She also had an x-ray of the tibia/fibula on the left which showed no acute bone abnormality. Her white blood cell count which was performed last on 03/25/16 was 7.3, hemoglobin 12.8, protein 7.1, albumin 3.0. Her urine culture appeared to be negative for any specific organisms. Patient did have a left lower extremity venous duplex evaluation for DVT. This did not include venous reflux studies but fortunately was negative for DVT. Patient also had arterial studies performed which revealed that she had a normal ABI on the right though this was unable to be performed on the left secondary to pain that she was having around the ankle region due to the wound. However it was stated on report that she had biphasic pulses and apparently good blood flow. ========== 06/03/17 she is here in follow-up evaluation for right lower extremity ulcer. The right lower sure he has healed but she has reopened to the left medial malleolus and dorsal foot with weeping. She is waiting for new compression garments to arrive from home health, the previous compression garments were ill fitting. We will continue with compression bilaterally and follow-up in 2 weeks Readmission: 08/05/17 on evaluation today patient appears to be doing somewhat poorly in regard to her left lower extremity especially although the  right lower extremity has a small area which may no longer be open. She has been having a lot of drainage from the left lower extremity however he tells me that she has not been able to use the EXTREMIT- EASE Compression at this point. She states that she did better and was able to actually apply the Juxta-Lite compression although the wound that she has is too large and therefore really does not compress which is why she cannot wear it at this point. She has no one who can help her put it on regular basis her son can sometimes but he's not able to do it most of the time. I do believe that's why she has begun to weave and have issues as she is currently yet again. No fevers, chills, nausea, or vomiting noted at this time. Patient is no evidence of dementia. 08/12/17 on evaluation today patient appears to still be doing fairly well in regard to the draining areas/weeping areas at this point. With that being said she unfortunately did go to the ER yesterday due to what was felt to be possibly a cellulitis. They place her on doxycycline by mouth and discharge her home. She definitely was not admitted. With that being said she states she has had more discomfort which has been unusual for her even compared to prior times and she's had infections.08/12/17 on evaluation today patient appears to still be doing fairly well in regard to the draining areas/weeping areas at this point. With that  being said she unfortunately did go to the ER yesterday due to what was felt to be possibly a cellulitis. They place her on doxycycline by mouth and discharge her home. She definitely was not admitted. With that being said she states she has had more discomfort which has been unusual for her even compared to prior times and she's had infections. 08/19/17 put evaluation today patient tells me that she's been having a lot of what sounds to be neuropathic type pain in regard to her left lower extremity. She has been using  over-the-counter topical bins again which some believe. That in order to apply the she actually remove the wrap we put on her last Wednesday on Thursday. Subsequently she has not had anything on compression wise since that time. The good news is a lot of the weeping areas appear to have closed at this point again I believe she would do better with compression but we are struggling to get her to actually use what she needs to at this point. No fevers, chills, nausea, or vomiting noted at this time. 09/03/17 on evaluation today patient appears to be doing okay in regard to her lower extremities in regard to the lymphedema and weeping. Fortunately she does not seem to show any signs of infection at this point she does have a little bit of weeping occurring in the right medial malleolus area. With that being said this does not appear to be too significant which is good news. 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 She has severe skin damage secondary to both of these features involving the dorsal left foot and medial left ankle and lower leg. Still has open areas in the left anterior foot. The area on the right closed over. She uses her own juxta light stockings. She does not have an arterial issue 09/16/17 on evaluation today patient actually appears to be doing excellent in regard to her bilateral lower extremity swelling. The Juxta-Lite compression wrap seem to be doing very well for her. She has not however been using the portion that goes over her foot. Her left foot still is draining a little bit not nearly as significant as it has been in the past but still I do believe that she likely needs to utilize the full wrap including the foot portion of this will improve as well. She also has been apparently putting on a significant amount of Vaseline which also think is not helpful for her. I recommended that if she feels she needs  something for moisturizer Eucerin will probably be better. 09/30/17 on evaluation today patient presents with several new open areas in regard to her left lower extremity although these appear to be minimal and mainly seem to be more moisture breakdown than anything. Fortunately she does not seem to have any evidence of infection which is great news. She has been tolerating the dressing changes without complication we are using silver alginate on the foot she has been using AB pads to have the legs and using her Juxta-Lite compression which seems to be controlling her swelling very well. Overall I'm pleased with the poor way she has progressed. 10/14/17 on evaluation today patient appears to be doing better in regard to her left lower extremity areas of weeping. She does still have some discomfort although in general this does not appear to be as macerated and I think it is progressing nicely. I do think she still needs to wear the  foot portion of her Juxta-Lite in order to get the most benefit from the wrap obviously. She states she understands. Fortunately there does not appear to be evidence of infection at this time which is great news. 10/28/17 on evaluation today patient appears to be doing excellent in regard to her left lower extremity. She has just a couple areas that are still open and seem to be causing any trouble whatsoever. For that reason I think that she is definitely headed in the right direction the spots are very tiny compared to what we have been dealing with in the past. 11/11/17 on evaluation today patient appears to have a right lateral lower extremity ulcer that has opened since I last saw her. She states this is where the home health nurse that was coming out remove the dressing without wetting the alginate first. Nonetheless I do not know if this is indeed the case or not but more importantly we have not ordered home help to be coming out for her wounds at all. I'm unsure as  to why they are coming out and we're gonna have to check on this and get things situated in that regard. With that being said we currently really do not need them to be coming out as the patient has been taking care of her leg herself without complication and no issues. In fact she was doing much better prior to nursing coming out. 11/25/17 on evaluation today patient actually appears to be doing fairly well in regard to her left lower extremity swelling. In fact she has very little area of weeping at this point there's just a small spot on the lateral portion of her right leg that still has me just a little bit more concerned as far as wanting to see this clear up before I discharge her to caring for this at home. Nonetheless overall she has made excellent progress. 12/09/17 on evaluation today patient appears to be doing rather well in regard to her lower extremity edema. She does have some weeping still in the left lower extremity although the big area we were taking care of two weeks ago actually has closed and she has another area of weeping on the left lower extremity immediately as well is the top of her foot. She does not currently have lymphedema pumps she has been wearing her compression daily on a regular basis as directed. With that being said I think she may benefit from lymphedema pumps. 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 We had a very difficult time getting and keeping this under control. 12/23/17 on evaluation today patient actually appears to be doing a little bit more poorly in regard to her bilateral lower extremities. She has been tolerating the Juxta-Lite compression wraps. Unfortunately she has two new ulcers on the right lower extremity and left lower Trinity ulceration seems to be larger. Obviously this is not good  news. She has been tolerating the dressings without complication. 12/30/17 on evaluation today patient actually appears to be doing much better in regard to her bilateral lower extremity edema. She continues to have some issues with ulcerations and in fact there appears to be one spot on each leg where the wrap may have caused a little bit of a blister which is subsequently opened up at this point is given her pain. Fortunately it does  not appear to be any evidence of infection which is good news. No fevers chills noted. 01/13/18 on evaluation today patient appears to be doing rather well in regard to her bilateral lower extremities. The dressings did get kind of stuck as far as the wound beds are concerned but again I think this is mainly due to the fact that she actually seems to be showing signs of healing which is good news. She's not having as much drainage therefore she was having more of the dressing sticking. Nonetheless overall I feel like her swelling is dramatically down compared to previous. 01/20/18 on evaluation today patient unfortunately though she's doing better in most regards has a large blister on the left anterior lower extremity where she is draining quite significantly. Subsequently this is going to need debridement today in order to see what's underneath and ensure she does not continue to trapping fluid at this location. Nonetheless No fevers, chills, nausea, or vomiting noted at this time. 01/27/18 on evaluation today patient appears to be doing rather well at this point in regard to her right lower extremity there's just a very small area that she still has open at this point. With that being said I do believe that she is tolerating the compression wraps very well in making good progress. Home health is coming out at this point to see her. Her left lower extremity on the lateral portion is actually what still mainly open and causing her some discomfort for the most  part 02/10/18 on evaluation today patient actually appears to be doing very well in regard to her right lower extremity were all the ulcers appear to be completely close. In regard to the left lower extremity she does have two areas still open and some leaking from the dorsal surface of her foot but this still seems to be doing much better to me in general. 02/24/18 on evaluation today patient actually appears to be doing much better in regard to her right lower extremity this is still completely healed. Her left lower extremity is also doing much better fortunately she has no evidence of infection. The one area that is gonna require some debridement is still on the left anterior shin. Fortunately this is not hurting her as badly today. 03/10/18 on evaluation today patient appears to be doing better in some regards although she has a little bit more open area on the dorsal foot and she also has some issues on the medial portion of the left lower extremity which is actually new and somewhat deep. With that being said there fortunately does not appear to be any significant signs of infection which is good news. No fevers, chills, nausea, or vomiting noted at this time. In general her swelling seems to be doing fairly well which is good news. 03/31/18 on evaluation today patient presents for follow-up concerning her left lower extremity lymphedema. Unfortunately she has been doing a little bit more poorly since I last saw her in regard to the amount of weeping that she is experiencing. She's also having some increased pain in the anterior shin location. Unfortunately I do not feel like the patient is making such good progress at this point a few weeks back she was definitely doing much better. 04/07/18 on evaluation today patient actually appears to be showing some signs of improvement as far as the left lower extremity is concerned. She has been tolerating the dressing changes and it does appear that  the Drawtex did better for her. With  that being said unfortunately home health is stating that they cannot obtain the Drawtex going forward. Nonetheless we're gonna have to check and see what they may be able to get the alginate they were using was getting stuck in causing new areas of skin being pulled all that with and subsequently weep and calls her to worsen overall this is the first time we've seen improvement at this time. 04/14/18 on evaluation today patient actually appears to be doing rather well at this point there does not appear to be any evidence of infection at this time and she is actually doing excellent in regard to the weeping in fact she almost has no openings remaining even compared to just last week this is a dramatic improvement. No fevers chills noted 04/21/18 evaluation today patient actually appears to be doing very well. She in fact is has a small area on the posterior lower extremity location and she has a small area on the dorsal surface of her foot that are still open both of which are very close to closing. We're hoping this will be close shortly. She brought her Juxta-Lite wrap with her today hoping that would be able to put her in it unfortunately I don't think were quite at that point yet but we're getting closer. 04/28/18 upon evaluation today patient actually appears to be doing excellent in regard to her left lower extremity ulcer. In fact the region on the posterior lower extremity actually is much smaller than previously noted. Overall I'm very happy with the progress she has made. She again did bring her Juxta-Lite although we're not quite ready for that yet. 05/11/18 upon evaluation today patient actually appears to be doing in general fairly well in regard to her left lower Trinity. The swelling is very well controlled. With that being said she has a new area on the left anterior lower extremity as well as between the first and second toes of her left foot  that was not present during the last evaluation. The region of her posterior left lower extremity actually appears to be almost completely healed. To be honest I'm very pleased with the way that stands. Nonetheless I do believe that the lotion may be keeping the area to moist as far as her legs are concerned subsequently I'm gonna consider discontinuing that today. 05/26/18 on evaluation today patient appears to be doing rather well in regard to her left lower should be ulcers. In fact everything appears to be close except for a very small area on the left posterior lower extremity. Fortunately there does not appear to be any evidence of infection at this time. Overall very pleased with her progress. 06/02/18 and evaluation today patient actually appears to be doing very well in regard to her lower extremity ulcers. She has one small area that still continues to weep that I think may benefit her being able to justify lotion and user Juxta-Lite wraps versus continued to wrap her. Nonetheless I think this is something we can definitely look into at this point. 06/23/18 on evaluation today patient unfortunately has openings of her bilateral lower extremities. In general she seems to be doing much worse than when I last saw her just as far as her overall health standpoint is concerned. She states that her discomfort is mainly due to neuropathy she's not having any other issues otherwise. No fevers, chills, nausea, or vomiting noted at this time. 06/30/18 on evaluation today patient actually appears to be doing a little worse in regard  to her right lower extremity her left lower extremity of doing fairly well. Fortunately there is no sign of infection at this time. She has been tolerating the dressing changes without complication. Home health did not come out like they were supposed to for the appropriate wrap changes. They stated that they never received the orders from [place] which were fax. Nonetheless we  will send a copy of the orders with the patient today as well. 07/07/18 on evaluation today patient appears to be doing much better in regard to lower extremities. She still has several openings bilaterally although since I last saw her her legs did show obvious signs of infection when she later saw her nurse. Subsequently a culture was obtained and she is been placed on Bactrim and Keflex. Fortunately things seem to be looking much better it does appear she likely had an infection. Again last week we'd even discussed it but again there really was not any obvious sign that she had infection therefore we held off on the antibiotics. Nonetheless I'm glad she's doing better today. 07/14/18 on evaluation today patient appears to be doing much better regarding her bilateral lower Trinity's. In fact on the right lower for me there's nothing open at this point there are some dry skin areas at the sites where she had infection. Fortunately there is no evidence of systemic infection which is excellent news. No fevers chills noted 07/21/18 on evaluation today patient actually appears to be doing much better in regard to her left lower extremity ulcers. She is making good progress and overall I feel like she's improving each time I see her. She's having no pain I do feel like the infection is completely resolved which is excellent news. No fevers, chills, nausea, or vomiting noted at this time. 07/28/18 on evaluation today patient appears to be doing very well in regard to her left lower [employer]. Everything seems to be showing signs of improvement which is excellent news. Overall very pleased with the progress that has been made. Fortunately there's no evidence of active infection at this time also excellent news. 08/04/18 on evaluation today patient appears to be doing more poorly in regard to her bilateral lower extremities. She has two new areas open up on the right and these were completely closed as of  last week. She still has the two spots on the left which in my pinion seem to be doing better. Fortunately there's no evidence of infection again at this point. 08/11/18 on evaluation today patient actually appears to be doing very well in regard to her bilateral lower Trinity wounds that all seem to be doing better and are measures smaller today. Fortunately there's no signs of infection. No fevers, chills, nausea, or vomiting noted at this time. 08/18/18 on evaluation today patient actually appears to be doing about the same inverter bilateral lower extremities. She continues to have areas that blistering open as was drain that fortunately nothing too significant. Overall I feel like Drawtex may have done better for her however compared to the collagen. 08/25/18 on evaluation today patient appears to be doing a little bit more poorly today even compared to last time I saw her. Again I'm not exactly sure why she's making worse progress over the past several weeks. I'm beginning to wonder if there is some kind of underlying low level infection causing this issue. I did actually take a culture from the left anterior lower extremity but it was a new wound draining quite a bit  at this point. Unfortunately she also seems to be having more pain which is what also makes me worried about the possibility of infection. This is despite never erythema noted at this point. 09/01/18 on evaluation today patient actually appears to be doing a little worse even compared to last week in regard to bilateral lower extremities. She did go to the hospital on the 16th was given a dose of IV Zosyn and then discharged with a recommendation to continue with the Bactrim that I previously prescribed for her. Nonetheless she is still having a lot of discomfort she tells me as well at this time. This is definitely unfortunate. No fevers, chills, nausea, or vomiting noted at this time. 09/08/18 on evaluation today patient's  bilateral lower extremities actually appear to be shown signs of improvement which is good news. Fortunately there does not appear to be any signs of active infection I think the anabiotic is helping in this regard. Overall I'm very pleased with how she is progressing. 09/15/18 patient was actually seen in ER yesterday due to her legs as well unfortunately. She states she's been having a lot of pain and discomfort as well as a lot of drainage. Upon inspection today the patient does have a lot of swelling and drainage I feel like this is more related to lymphedema and poor fluid control than it is to infection based on what I'm seeing. The physician in the emergency department also doubted that the patient was having a significant infection nonetheless I see no evidence of infection obvious at this point although I do see evidence of poor fluid control. She still not using a compression pumps, she is not elevating due to her lift chair as well as her hospital bed being broken, and she really is not keeping her legs up as much as they should be and also has been taking off her wraps. All this combined I think has led to poor fluid control and to be honest she may be somewhat volume overloaded in general as well. I recommend that she may need to contact your physician to see if a prescription for a diuretic would be beneficial in their opinion. As long as this is safe I think it would likely help her. 09/29/18 on evaluation today patient's left lower extremity actually appears to be doing quite a bit better. At least compared to last time that I saw her. She still has a large area where she is draining from but there's a lot of new skin speckled trout and in fact there's more new skin that there are open areas of weeping and drainage at this point. This is good news. With regard to the right lower extremity this is doing much better with the only open area that I really see being a dry spot on the right  lateral ankle currently. Fortunately there's no signs of active infection at this time which is good news. No fevers, chills, nausea, or vomiting noted at this time. The patient seems somewhat stressed and overwhelmed during the visit today she was very lethargic as such. She does and she is not taking any pain medications at this point. Apparently according to her husband are also in the process of moving which is probably taking its toll on her as well. 10/06/18 on evaluation today patient appears to be doing rather well in regard to her lower extremities compared to last evaluation. Fortunately there's no signs of active infection. She tells me she did have an appointment  with her primary. Nonetheless he was concerned that the wounds were somewhat deep based on pictures but we never actually saw her legs. She states that he had her somewhat worried due to the fact that she was fearing now that she was [place] have to have an amputation. With that being said based on what I'm seeing check she looks better this week that she has the last two times I've seen her with much less drainage I'm actually pleased in this regard. That doesn't mean that she's out of the water but again I do not think what the point of talking about education at all in regard to her leg. She is very happy to hear this. She is also not having as much pain as she was having last week. 10/13/18 unfortunately on evaluation today patient still continues to have a significant amount of drainage she's not letting home health actually apply the compression dressings at this point. She's trying to use of Juxta-Lite of the top of Kerlex and the second layer of the three layer compression wrap. With that being said she just does not seem to be making as good a progress as I would expect if she was having the compression applied and in place on a regular basis. No fevers, chills, nausea, or vomiting noted at this time. 10/20/18 on evaluation  today patient appears to be doing a little better in regard to her bilateral lower extremity ulcers. In fact the right lower extremity seems to be healed she doesn't even have any openings at this point left lower extremity though still somewhat macerated seems to be showing signs of new skin growth at multiple locations throughout. Fortunately there's no evidence of active infection at this time. No fevers, chills, nausea, or vomiting noted at this time. 10/27/18 on evaluation today patient appears to be doing much better in regard to her left lower Trinity ulcer. She's been tolerating the laptop complication and has minimal drainage noted at this point. Fortunately there's no signs of active infection at this time. No fevers, chills, nausea, or vomiting noted at this time. 11/03/18 on evaluation today patient actually appears to be doing excellent in regard to her left lower extremity. She is having very little drainage at this point there does not appear to be any significant signs of infection overall very pleased with how things have gone. She is likewise extremely pleased still and seems to be making wonderful progress week to week. I do believe antibiotics were helpful for her. Her primary care provider did place on amateur clean since I last saw her. 11/17/18 on evaluation today patient appears to be doing worse in regard to her bilateral lower extremities at this point. She is been tolerating the dressing changes without complication. With that being said she typically takes the Coban off fairly quickly upon arriving home even after being seen here in the clinic and does not allow home health reapply command as part of the dressing at home. Therefore she said no compression essentially since I last saw her as best I can tell. With that being said I think it shows and how much swelling she has in the open wounds that are noted at this point. Fortunately there's no signs of infection but  unfortunately if she doesn't get this under control I think she will end up with infection and more significant issues. 11/24/18 on evaluation today patient actually appears to be doing somewhat better in regard to her bilateral lower extremities. She still  tells me she has not been using her compression pumps she tells me the reason is that she had gout of her right great toe and listen to much pain to do this over the past week. Nonetheless that is doing better currently so she should be able to attempt reinitiating the lymphedema pumps at this time. No fevers, chills, nausea, or vomiting noted at this time. 12/01/18 upon evaluation today patient's left lower extremity appears to be doing quite well unfortunately her right lower extremity is not doing nearly as well. She has been tolerating the dressing changes without complication unfortunately she did not keep a wrap on the right at this time. Nonetheless I believe this has led to increased swelling and weeping in the world is actually much larger than during the last evaluation with her. 12/08/18 on evaluation today patient appears to be doing about the same at this point in regard to her right lower extremity. There is some more palatable to touch I'm concerned about the possibility of there being some infection although I think the main issue is she's not keeping her compression wrap on which in turn is not allowing this area to heal appropriately. 12/22/18 on evaluation today patient appears to be doing better in regard to left lower extremity unfortunately significantly worse in regard to the right lower extremity. The areas of blistering and necrotic superficial tissue have spread and again this does not really appear to be signs of infection and all she just doesn't seem to be doing nearly as well is what she has been in the past. Overall I feel like the Augmentin did absolutely nothing for her she doesn't seem to have any infection again I  really didn't think so last time either is more of a potential preventative measure and hoping that this would make some difference but I think the main issue is she's not wearing her compression. She tells me she cannot wear the [place] been we put on she takes it off pretty much upon getting home. Subsequently she worshiped Juxta-Lite when I questioned her about how often she wears it this is no more than three hours a day obviously that leaves 21 hours that she has no compression and this is obviously not doing well for her. Overall I'm concerned that if things continue to worsen she is at great risk of both infection as well as losing her leg. 01/05/19 on evaluation today patient appears to be doing well in regard to her left lower extremity which he is allowing [place] to wrap and not so well with regard to her right lower extremity which she is not allowing [place] to really wrap and keep the wrap on. She states that it hurts too badly whenever it's wrapped and she ends up having to take it off. She's been using the Juxta-Lite she tells me up to six hours a day although I question whether or not that's really been the case to be honest. Previously she told me three hours today nonetheless obviously the legs as long as the wrap is doing great when she is not is doing much more poorly. 01/12/2019 on evaluation today patient actually appears to be doing a little better in my opinion with regard to her right lower extremity ulcer. She has a small open area on the left lower extremity unfortunately but again this I think is part of the normal fluctuation of what she is going to have to expect with regard to her legs especially when she  is not using her lymphedema pumps on a regular basis. Subsequently based on what I am seeing today I think that she does seem to be doing slightly better with regard to her right lower extremity she did see her primary care provider on Monday they felt she had an infection and  placed her on 2 antibiotics. Both Cipro and clindamycin. Subsequently again she seems possibly to be doing a little bit better in regards to the right lower extremity she also tells me however she has been wearing the compression wrap over the past week since I spoke with her as well that is a Kerlix and Coban wrap on the right. No fevers, chills, nausea, vomiting, or diarrhea. 01/19/2019 on evaluation today patient appears to be doing better with regard to her bilateral lower extremities especially the right. I feel like the compression has been beneficial for her which is great news. She did get a call from her primary care provider on her way here today telling her that she did have methicillin-resistant Staphylococcus aureus and he was calling in a couple new antibiotics for her including a ointment to be applied she tells me 3 times a day. With that being said this sounds like likely to be Bactroban which I think could be applied with each dressing/wrap change but I would not be able to accommodate her applying this 3 times a day. She is in agreement with the least doing this we will add that to her orders today. 01/26/2019 on evaluation today patient actually appears to be doing much better with regard to her right lower extremity. Her left lower extremity is also doing quite well all things considering. Fortunately there is no evidence of active infection at this time. No fevers, chills, nausea, vomiting, or diarrhea. 02/02/2019 on evaluation today patient appears to be doing much better compared to her last evaluation. Little by little off like her right leg is returning more towards normal. There does not appear to be any signs of active infection and overall she seems to be doing quite well which is great news. I am very pleased in this regard. No fevers, chills, nausea, vomiting, or diarrhea. 02/09/2019 upon evaluation today patient appears to be doing better with regard to her bilateral  lower extremities. She has been tolerating the dressing changes without complication. Fortunately there is no signs of active infection at this time. No fevers, chills, nausea, vomiting, or diarrhea. 02/23/2019 on evaluation today patient actually appears to be doing quite well with regard to her bilateral lower extremities. She has been tolerating the dressing changes without complication. She is even used her pumps one time and states that she really felt like it felt good. With that being said she seems to be in good spirits and her legs appear to be doing excellent. 03/09/2019 on evaluation today patient appears to be doing well with regard to her right lower extremity there are no open wounds at this time she is having some discomfort but I feel like this is more neuropathy than anything. With regard to her left lower extremity she had several areas scattered around that she does have some weeping and drainage from but again overall she does not appear to be having any significant issues and no evidence of infection at this time which is good news. 03/23/2019 on evaluation today patient appears to be doing well with regard to her right lower extremity which she tells me is still close she is using her juxta light here.  Her left lower extremity she mainly just has an area on the foot which is still slightly draining although this also is doing great. Overall very pleased at this time. 04/06/2019 patient appears to be doing a little bit worse in regard to her left lower extremity upon evaluation today. She feels like this could be becoming infected again which she had issues with previous. Fortunately there is no signs of systemic infection but again this is always a struggle with her with her legs she will go from doing well to not so well in a very short amount of time. 04/20/2019 on evaluation today patient actually appears to be doing quite well with regard to her right lower extremity I do not  see any signs of active infection at this time. Fortunately there is no fever chills noted. She is still taking the antibiotics which I prescribed for her at this point. In regard to the left lower extremity I do feel like some of these areas are better although again she still is having weeping from several locations at this time. 04/27/2019 on evaluation today patient appears to be doing about the same if not slightly worse in regard to her left lower extremity ulcers. She tells me when questioned that she has been sleeping in her Hoveround chair in fact she tells me she falls asleep without even knowing it. I think she is spending a whole lot of time in the chair and less time walking and moving around which is not good for her legs either. On top of that she is in a seated position which is also the worst position she is not really elevating her legs and she is also not using her lymphedema pumps. All this is good to contribute to worsening of her condition in general. 05/18/2019 on evaluation today patient appears to be doing well with regard to her lower extremity on the right in fact this is showing no signs of any open wounds at this time. On the left she is continuing to have issues with areas that do drain. Some of the regions have healed and there are couple areas that have reopened. She did go to the ER per the patient according to recommendations from the home health nurse due to what she was seen when she came out on 05/13/2019. Subsequently she felt like the patient needed to go to the hospital due to the fact that again she was having "milky white discharge" from her leg. Nonetheless she had and then was placed on doxycycline and subsequently seems to be doing better. 06/01/2019 upon evaluation today patient appears to be doing really in my opinion about the same. I do not see any signs of active infection which is good news. Overall she still has wounds over the bilateral lower  extremities she has reopened on the right but this appears to be more of a crack where there is weeping/edema coming from the region. I do not see any evidence of infection at either site based on what I visualized today. 07/13/2019 upon evaluation today patient appears to be doing a little worse compared to last time I saw her. She since has been in the hospital from 06/21/2019 through 06/29/2019. This was secondary to having Covid. During that time they did apply lotion to her legs which unfortunately has caused her to develop a myriad of open wounds on her lower extremities. Her legs do appear to be doing better as far as the overall appearance is concerned but  nonetheless she does have more open and weeping areas. 07/27/2019 upon evaluation today patient appears to be doing more poorly to be honest in regard to her left lower extremity in particular. There is no signs of systemic infection although I do believe she may have local infection. She notes she has been having a lot of blue/green drainage which is consistent potentially with Pseudomonas. That may be something that we need to consider here as well. The doxycycline does not seem to have been helping. 08/03/2019 upon evaluation today patient appears to be doing a little better in my opinion compared to last week's evaluation. Her culture I did review today and she is on appropriate medications to help treat the Enterobacter that was noted. Overall I feel like that is good news. With that being said she is unfortunately continuing to have a lot of drainage and though it is doing better I still think she has a long ways to go to get things dried up in general. Fortunately there is no signs of systemic infection. 08/10/2019 upon evaluation today patient appears to be doing may be slightly better in regard to her left lower extremity the right lower extremity is doing much better. Fortunately there is no signs of infection right now which  is good news. No fevers, chills, nausea, vomiting, or diarrhea. 08/24/2019 on evaluation today patient appears to be doing slightly better in regard to her lower extremities. The left lower extremity seems to be healed the right lower extremity is doing better though not completely healed as far as the openings are concerned. She has some generalized issues here with edema and weeping secondary to her lymphedema though again I do believe this is little bit drier compared to prior weeks evaluations. In general I am very pleased with how things seem to be progressing. No fevers, chills, nausea, vomiting, or diarrhea. 08/31/2019 upon evaluation today patient actually seems to making some progress here with regard to the left lower extremity in particular. She has been tolerating the dressing changes without complication. Fortunately there is no signs of active infection at this time. No fevers, chills, nausea, vomiting, or diarrhea. She did see Dr. Doren Custard and he did note that she did have a issue with the left great saphenous vein and the small saphenous vein in the leg. With that being said he was concerned about the possibility of laser ablation not being extremely successful. He also mentioned a small risk of DVT associated with the procedure. However if the wounds do not continue to improve he stated that that would probably be the way to go. Fortunately the patient's legs do seem to be doing much better. 09/07/2019 upon evaluation today patient appears to be doing better with regard to her lower extremities. She has been tolerating the dressing changes without complication. With that being said she is showing signs of improvement and overall very pleased. There are some areas on her leg that I think we do need to debride we discussed this last week the patient is in agreement with doing that as long as it does not hurt too badly. 09/14/2019 upon evaluation today patient appears to be doing decently  well with regard to her left lower extremity. She is not having near as much weeping as she has had in the past things seem to be drying up which is good news. There is no signs of active infection at this time. Objective Constitutional Well-nourished and well-hydrated in no acute distress. Vitals Time Taken: 9:27  AM, Height: 62 in, Weight: 335 lbs, BMI: 61.3, Temperature: 97.9 F, Pulse: 84 bpm, Respiratory Rate: 18 breaths/min, Blood Pressure: 109/84 mmHg, Capillary Blood Glucose: 124 mg/dl. Respiratory normal breathing without difficulty. Psychiatric this patient is able to make decisions and demonstrates good insight into disease process. Alert and Oriented x 3. pleasant and cooperative. General Notes: Patient's wound bed currently showed signs of good granulation at this time. Fortunately there is no signs of active infection currently and I do feel like her legs are drying up quite nicely which is good news. Overall I am extremely pleased with the progress that the patient has made. Integumentary (Hair, Skin) Wound #61 status is Open. Original cause of wound was Gradually Appeared. The wound is located on the Left,Circumferential Lower Leg. The wound measures 20cm length x 23cm width x 0.1cm depth; 361.283cm^2 area and 36.128cm^3 volume. There is Fat Layer (Subcutaneous Tissue) Exposed exposed. There is no tunneling or undermining noted. There is a large amount of serous drainage noted. The wound margin is flat and intact. There is small (1-33%) pink, pale granulation within the wound bed. There is a large (67-100%) amount of necrotic tissue within the wound bed including Adherent Slough. Wound #64 status is Open. Original cause of wound was Gradually Appeared. The wound is located on the Left,Dorsal Foot. The wound measures 7cm length x 6cm width x 0.1cm depth; 32.987cm^2 area and 3.299cm^3 volume. There is Fat Layer (Subcutaneous Tissue) Exposed exposed. There is no tunneling or  undermining noted. There is a small amount of serous drainage noted. The wound margin is flat and intact. There is medium (34-66%) pink, pale granulation within the wound bed. There is a medium (34-66%) amount of necrotic tissue within the wound bed including Adherent Slough. 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 There was a Three Layer Compression Therapy Procedure by Carlene Coria, RN. Post procedure Diagnosis Wound #61: Same as Pre-Procedure Plan Follow-up Appointments: Return Appointment in 1 week. Dressing Change Frequency: Wound #61 Left,Circumferential Lower Leg: Change dressing three times week. Wound #64 Left,Dorsal Foot: Change dressing three times week. Skin Barriers/Peri-Wound Care: Barrier cream - zinc oxide cream to any macerated areas Moisturizing lotion - to right leg daily Wound Cleansing: Clean wound with Wound Cleanser - all wounds May shower with protection. Primary Wound Dressing: Wound #61 Left,Circumferential Lower Leg: Calcium Alginate with Silver Wound #64 Left,Dorsal Foot: Calcium Alginate with Silver Secondary Dressing: Wound #61 Left,Circumferential Lower Leg: Dry Gauze - all wounds ABD pad - as  needed Zetuvit or Kerramax - as needed Wound #64 Left,Dorsal Foot: Dry Gauze - all wounds ABD pad - as needed Edema Control: 3 Layer Compression System - Left Lower Extremity Avoid standing for long periods of time - walking is encouraged Elevate legs to the level of the heart or above for 30 minutes daily and/or when sitting, a frequency of: -  do not sleep in chair with feet dangling, MUST elevate legs while sitting Exercise regularly Support Garment 20-30 mm/Hg pressure to: - Juxtalite to right lower leg daily Segmental Compressive Device. - lymphedema pumps 60 minutes 1- 2 times per day Off-Loading: Turn and reposition every 2 hours Additional Orders / Instructions: Follow Nutritious Diet - To include vitamin A, vitamin C, and Zinc along with increased protein intake. Home Health: [facility] for [facility] care. - [facility]. I would recommend that we continue currently with the wound care measures were using the alginate dressing over the weeping areas and using the Desitin to help protect any good skin regions. Overall the patient seems to be doing well in this regard. 2. Also can recommend that we continue with the 3 layer compression wrap the left lower extremity. 3. The patient needs to be using her lymphedema pumps daily although I am not sure if she is doing this she also needs to continue to elevate her legs much as possible. She does not need to be sleeping in a recliner although I think she does. We will see patient back for reevaluation in 1 week here in the clinic. If anything worsens or changes patient will contact our office for additional recommendations. Electronic Signature(s) Signed: 09/14/2019 10:44:05 AM By: [name] PA-C Entered By: [name] on 09/14/2019 10:44:04 -------------------------------------------------------------------------------- SuperBill Details Patient Name: Date of Service: [name]  09/14/2019 Medical Record [id number] Patient Account Number: [id number] Date of Birth/Sex: Treating RN: 21-Oct-1950 (68 y.o. [name] Primary Care Provider: [name] Other Clinician: Referring Provider: Treating Provider/Extender:Stone III, [name], FRED Weeks in Treatment: 110 Diagnosis Coding ICD-10 Codes Code Description E11.622 Type 2 diabetes mellitus with other skin ulcer I89.0 Lymphedema, not elsewhere classified I87.331 Chronic venous hypertension (idiopathic) with ulcer and inflammation of right lower extremity I87.332 Chronic venous hypertension (idiopathic) with ulcer and inflammation of left lower extremity L97.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 leg with fat layer exposed L97.822 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 I10 Essential (primary) hypertension E66.01 Morbid (severe) obesity due to excess calories F41.8 Other specified anxiety disorders R53.1 Weakness Facility Procedures The patient participates with Medicare or their insurance follows the Medicare Facility Guidelines: CPT4 Code Description Modifier Quantity [id number] (Facility Use Only) [phone] - [facility] Physician Procedures CPT4 Code Description: [id number] - WC PHYS LEVEL 3 - EST PT ICD-10 Diagnosis Description E11.622 Type 2 diabetes mellitus with other skin ulcer I89.0 Lymphedema, not elsewhere classified I87.331 Chronic venous hypertension (idiopathic) with ulcer and  lower extremity I87.332 Chronic venous hypertension (idiopathic) with ulcer and extremity Modifier: inflammation of inflammation of Quantity: 1 right left lower Electronic Signature(s) Signed: 09/14/2019 10:44:26 AM By: [name] PA-C Entered By: [name] on 09/14/2019 10:44:24

## 2019-09-19 ENCOUNTER — Telehealth: Payer: Self-pay | Admitting: Plastic Surgery

## 2019-09-19 NOTE — Telephone Encounter (Signed)
Called patient to discuss surgery dates with patient. Patient advised that she does not want to have surgery for the debridement of her legs at all. She has changed her mind, but will call us back if she changes her mind again.   Both Dr. Ulice Bold and Dr. Mikey Bussing are aware.

## 2019-09-21 ENCOUNTER — Other Ambulatory Visit: Payer: Self-pay

## 2019-09-21 ENCOUNTER — Encounter (HOSPITAL_BASED_OUTPATIENT_CLINIC_OR_DEPARTMENT_OTHER): Payer: Medicare PPO | Attending: Physician Assistant | Admitting: Physician Assistant

## 2019-09-21 DIAGNOSIS — I890 Lymphedema, not elsewhere classified: Secondary | ICD-10-CM | POA: Diagnosis not present

## 2019-09-21 DIAGNOSIS — E1051 Type 1 diabetes mellitus with diabetic peripheral angiopathy without gangrene: Secondary | ICD-10-CM | POA: Insufficient documentation

## 2019-09-21 DIAGNOSIS — M5432 Sciatica, left side: Secondary | ICD-10-CM | POA: Diagnosis not present

## 2019-09-21 DIAGNOSIS — Z90710 Acquired absence of both cervix and uterus: Secondary | ICD-10-CM | POA: Insufficient documentation

## 2019-09-21 DIAGNOSIS — M109 Gout, unspecified: Secondary | ICD-10-CM | POA: Insufficient documentation

## 2019-09-21 DIAGNOSIS — G629 Polyneuropathy, unspecified: Secondary | ICD-10-CM | POA: Diagnosis not present

## 2019-09-21 DIAGNOSIS — E10621 Type 1 diabetes mellitus with foot ulcer: Secondary | ICD-10-CM | POA: Diagnosis not present

## 2019-09-21 DIAGNOSIS — L97822 Non-pressure chronic ulcer of other part of left lower leg with fat layer exposed: Secondary | ICD-10-CM | POA: Insufficient documentation

## 2019-09-21 DIAGNOSIS — I10 Essential (primary) hypertension: Secondary | ICD-10-CM | POA: Diagnosis not present

## 2019-09-21 DIAGNOSIS — Z6841 Body Mass Index (BMI) 40.0 and over, adult: Secondary | ICD-10-CM | POA: Diagnosis not present

## 2019-09-21 DIAGNOSIS — E10622 Type 1 diabetes mellitus with other skin ulcer: Secondary | ICD-10-CM | POA: Insufficient documentation

## 2019-09-21 DIAGNOSIS — I87311 Chronic venous hypertension (idiopathic) with ulcer of right lower extremity: Secondary | ICD-10-CM | POA: Diagnosis not present

## 2019-09-21 DIAGNOSIS — J45909 Unspecified asthma, uncomplicated: Secondary | ICD-10-CM | POA: Insufficient documentation

## 2019-09-21 DIAGNOSIS — I872 Venous insufficiency (chronic) (peripheral): Secondary | ICD-10-CM | POA: Diagnosis not present

## 2019-09-21 DIAGNOSIS — Z8673 Personal history of transient ischemic attack (TIA), and cerebral infarction without residual deficits: Secondary | ICD-10-CM | POA: Insufficient documentation

## 2019-09-21 DIAGNOSIS — K449 Diaphragmatic hernia without obstruction or gangrene: Secondary | ICD-10-CM | POA: Insufficient documentation

## 2019-09-21 DIAGNOSIS — Z9119 Patient's noncompliance with other medical treatment and regimen: Secondary | ICD-10-CM | POA: Diagnosis not present

## 2019-09-21 DIAGNOSIS — M1990 Unspecified osteoarthritis, unspecified site: Secondary | ICD-10-CM | POA: Insufficient documentation

## 2019-09-21 DIAGNOSIS — L97522 Non-pressure chronic ulcer of other part of left foot with fat layer exposed: Secondary | ICD-10-CM | POA: Diagnosis not present

## 2019-09-21 DIAGNOSIS — F329 Major depressive disorder, single episode, unspecified: Secondary | ICD-10-CM | POA: Diagnosis not present

## 2019-09-21 DIAGNOSIS — L97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 leg with fat layer exposed: Secondary | ICD-10-CM | POA: Diagnosis not present

## 2019-09-21 NOTE — Progress Notes (Addendum)
[name], [name] ([id number]) Visit Report for 09/21/2019 Arrival Information Details Patient Name: Date of Service: [name], [name] 09/21/2019 10:00 A M Medical Record Number: [id number] Patient Account Number: [id number] Date of Birth/Sex: Treating RN: 04-02-1951 (69 y.o. [name], [name] Primary Care Shane Badeaux: [name] Other Clinician: Referring Sondra Blixt: Treating Olen Eaves/Extender: Doyle Askew, FRED Weeks in Treatment: 111 Visit Information History Since Last Visit Added or deleted any medications: No Patient Arrived: Wheel Chair Any new allergies or adverse reactions: No Arrival Time: 10:20 Had a fall or experienced change in No Accompanied By: self activities of daily living that may affect Transfer Assistance: None risk of falls: Patient Identification Verified: Yes Signs or symptoms of abuse/neglect since last visito No Secondary Verification Process Completed: Yes Hospitalized since last visit: No Patient Requires Transmission-Based Precautions: No Implantable device outside of the clinic excluding No Patient Has Alerts: Yes cellular tissue based products placed in the center Patient Alerts: R ABI= 1.01 since last visit: L ABI = .99 Has Dressing in Place as Prescribed: Yes Has Compression in Place as Prescribed: Yes Pain Present Now: Yes Electronic Signature(s) Signed: 09/21/2019 5:46:37 PM By: Deon Pilling Entered By: Deon Pilling on 09/21/2019 10:21:02 -------------------------------------------------------------------------------- Compression Therapy Details Patient Name: Date of Service: [name]. 09/21/2019 10:00 A M Medical Record Number: [id number] Patient Account Number: [id number] Date of Birth/Sex: Treating RN: 1950-08-12 (69 y.o. [name] Primary Care Amaranta Mehl: [name] Other Clinician: Referring Zackary Mckeone: Treating Roselynn Whitacre/Extender: Doyle Askew, FRED Weeks in Treatment: 111 Compression Therapy Performed for  Wound Assessment: Wound #61 Left,Circumferential Lower Leg Performed By: Clinician Carlene Coria, RN Compression Type: Three Layer Post Procedure Diagnosis Same as Pre-procedure Electronic Signature(s) Signed: 09/21/2019 6:07:56 PM By: [name] RN, BSN Entered By: [name] on 09/21/2019 11:23:04 -------------------------------------------------------------------------------- Compression Therapy Details Patient Name: Date of Service: [name]. 09/21/2019 10:00 A M Medical Record Number: [id number] Patient Account Number: [id number] Date of Birth/Sex: Treating RN: February 01, 1951 (69 y.o. [name] Primary Care Latisia Hilaire: [name] Other Clinician: Referring Zayra Devito: Treating Refugia Laneve/Extender: Doyle Askew, FRED Weeks in Treatment: 111 Compression Therapy Performed for Wound Assessment: Wound #64 Left,Dorsal Foot Performed By: Clinician Carlene Coria, RN Compression Type: Three Layer Post Procedure Diagnosis Same as Pre-procedure Electronic Signature(s) Signed: 09/21/2019 6:07:56 PM By: [name] RN, BSN Entered By: [name] on 09/21/2019 11:23:04 -------------------------------------------------------------------------------- Encounter Discharge Information Details Patient Name: Date of Service: [name]. 09/21/2019 10:00 A M Medical Record Number: [id number] Patient Account Number: [id number] Date of Birth/Sex: Treating RN: 01/16/1951 (69 y.o. [name] Primary Care Yuniel Blaney: [name] Other Clinician: Referring Tyshika Baldridge: Treating Kaien Pezzullo/Extender: Doyle Askew, FRED Weeks in Treatment: [phone] Encounter Discharge Information Items Discharge Condition: Stable Ambulatory Status: Wheelchair Discharge Destination: Home Transportation: Private Auto Accompanied By: self Schedule Follow-up Appointment: Yes Clinical Summary of Care: Patient Declined Electronic Signature(s) Signed: 09/21/2019 5:52:27 PM By:  Carlene Coria RN Entered By: Carlene Coria on 09/21/2019 11:41:36 -------------------------------------------------------------------------------- Lower Extremity Assessment Details Patient Name: Date of Service: [name] 09/21/2019 10:00 A M Medical Record Number: [id number] Patient Account Number: [id number] Date of Birth/Sex: Treating RN: [birth year] (69 y.o. [name] Primary Care Manual Navarra: [name] Other Clinician: Referring Leaf [employer]: Treating Howard Bunte/Extender: Doyle Askew, FRED Weeks in Treatment: 111 Edema Assessment Assessed: [Left: Yes] [Right: Yes] Edema: [Left: Yes] [Right: Yes] Calf Left: Right: Point of Measurement: 37 cm From Medial Instep 44 cm 38 cm Ankle Left: Right: Point of Measurement: 10 cm  From Medial Instep 31.5 cm 23.5 cm Electronic Signature(s) Signed: 09/21/2019 5:46:37 PM By: Deon Pilling Entered By: Deon Pilling on 09/21/2019 10:33:44 -------------------------------------------------------------------------------- [place] Details Patient Name: Date of Service: [name]. 09/21/2019 10:00 A M Medical Record Number: [id number] Patient Account Number: [id number] Date of Birth/Sex: Treating RN: 05/01/1951 (69 y.o. [name] Primary Care Bhavin Monjaraz: [name] Other Clinician: Referring Francis Doenges: Treating Codi Kertz/Extender: Doyle Askew, FRED Weeks in Treatment: [phone] Active Inactive Venous Leg Ulcer Nursing Diagnoses: Actual venous Insuffiency (use after diagnosis is confirmed) Knowledge deficit related to disease process and management Goals: Patient will maintain optimal edema control Date Initiated: 08/12/2017 Target Resolution Date: 10/19/2019 Goal Status: Active Patient/caregiver will verbalize understanding of disease process and disease management Date Initiated: 08/12/2017 Date Inactivated: 04/21/2018 Target Resolution Date: 04/24/2018 Goal Status:  Met Interventions: Assess peripheral edema status every visit. Compression as ordered Treatment Activities: Therapeutic compression applied : 08/12/2017 Notes: Wound/Skin Impairment Nursing Diagnoses: Impaired tissue integrity Knowledge deficit related to ulceration/compromised skin integrity Goals: Patient/caregiver will verbalize understanding of skin care regimen Date Initiated: 08/12/2017 Target Resolution Date: 10/19/2019 Goal Status: Active Ulcer/skin breakdown will have a volume reduction of 30% by week 4 Date Initiated: 08/05/2017 Date Inactivated: 09/30/2017 Target Resolution Date: 10/03/2017 Goal Status: Met Ulcer/skin breakdown will have a volume reduction of 50% by week 8 Date Initiated: 09/30/2017 Date Inactivated: 10/28/2017 Target Resolution Date: 10/28/2017 Goal Status: Met Interventions: Assess patient/caregiver ability to perform ulcer/skin care regimen upon admission and as needed Assess ulceration(s) every visit Provide education on ulcer and skin care Screen for HBO Treatment Activities: Patient referred to home care : 08/05/2017 Skin care regimen initiated : 08/05/2017 Topical wound management initiated : 08/05/2017 Notes: Electronic Signature(s) Signed: 09/21/2019 6:07:56 PM By: [name] RN, BSN Entered By: [name] on 09/21/2019 11:09:59 -------------------------------------------------------------------------------- Pain Assessment Details Patient Name: Date of Service: [name]. 09/21/2019 10:00 A M Medical Record Number: [id number] Patient Account Number: [id number] Date of Birth/Sex: Treating RN: [birth year] (69 y.o. [name] Primary Care Antaniya Venuti: [name] Other Clinician: Referring Page Pucciarelli: Treating Chardae Mulkern/Extender: Doyle Askew, FRED Weeks in Treatment: [phone] Active Problems Location of Pain Severity and Description of Pain Patient Has Paino Yes Site Locations Pain Location: Generalized Pain Rate the  pain. Current Pain Level: 9 Worst Pain Level: 10 Least Pain Level: 0 Tolerable Pain Level: 9 Pain Management and Medication Current Pain Management: Medication: No Cold Application: No Rest: No Massage: No Activity: No T.E.N.S.: No Heat Application: No Leg drop or elevation: No Is the Current Pain Management Adequate: Adequate How does your wound impact your activities of daily livingo Sleep: No Bathing: No Appetite: No Relationship With Others: No Bladder Continence: No Emotions: No Bowel Continence: No Work: No Toileting: No Drive: No Dressing: No Hobbies: No Notes headache per patient. Electronic Signature(s) Signed: 09/21/2019 5:46:37 PM By: Deon Pilling Entered By: Deon Pilling on 09/21/2019 10:33:20 -------------------------------------------------------------------------------- Patient/Caregiver Education Details Patient Name: Date of Service: [name], [name] 4/7/2021andnbsp10:00 A M Medical Record Number: [id number] Patient Account Number: [id number] Date of Birth/Gender: Treating RN: 1951/05/24 (69 y.o. [name] Primary Care Physician: [name] Other Clinician: Referring Physician: Treating Physician/Extender: Doyle Askew, FRED Weeks in Treatment: [phone] Education Assessment Education Provided To: Patient Education Topics Provided Venous: Methods: Explain/Verbal Responses: Reinforcements needed, State content correctly Electronic Signature(s) Signed: 09/21/2019 6:07:56 PM By: [name] RN, BSN Entered By: [name] on 09/21/2019 11:10:37 -------------------------------------------------------------------------------- Wound Assessment Details Patient Name: Date of Service: [name] Combs  Andrienne M. 09/21/2019 10:00 A M Medical Record Number: [id number] Patient Account Number: [id number] Date of Birth/Sex: Treating RN: [birth year] (69 y.o. [name] Primary Care Wagner Tanzi: [name] Other Clinician: Referring  Marv Alfrey: Treating Nivea Wojdyla/Extender: Doyle Askew, FRED Weeks in Treatment: 111 Wound Status Wound Number: 61 Primary Venous Leg Ulcer Etiology: Wound Location: Left, Circumferential Lower Leg Wound Open Wounding Event: Gradually Appeared Status: Date Acquired: 04/20/2019 Comorbid Asthma, Hypertension, Peripheral Arterial Disease, Peripheral Weeks Of Treatment: 22 History: Venous Disease, Type II Diabetes, Gout, Osteoarthritis Clustered Wound: No Photos Wound Measurements Length: (cm) 16.5 Width: (cm) 24 Depth: (cm) 0.1 Area: (cm) 311.018 Volume: (cm) 31.102 % Reduction in Area: -4024.9% % Reduction in Volume: -4024.9% Epithelialization: Small (1-33%) Tunneling: No Undermining: No Wound Description Classification: Full Thickness Without Exposed Support Structures Wound Margin: Flat and Intact Exudate Amount: Large Exudate Type: Serous Exudate Color: amber Foul Odor After Cleansing: No Slough/Fibrino Yes Wound Bed Granulation Amount: Small (1-33%) Exposed Structure Granulation Quality: Pink, Pale Fascia Exposed: No Necrotic Amount: Large (67-100%) Fat Layer (Subcutaneous Tissue) Exposed: Yes Necrotic Quality: Adherent Slough Tendon Exposed: No Muscle Exposed: No Joint Exposed: No Bone Exposed: No Electronic Signature(s) Signed: 09/28/2019 3:52:45 PM By: Mikeal Hawthorne EMT/HBOT Signed: 11/07/2019 1:31:05 PM By: Deon Pilling Previous Signature: 09/21/2019 5:46:37 PM Version By: Deon Pilling Entered By: Mikeal Hawthorne on 09/28/2019 15:00:51 -------------------------------------------------------------------------------- Wound Assessment Details Patient Name: Date of Service: [name]. 09/21/2019 10:00 A M Medical Record Number: [id number] Patient Account Number: [id number] Date of Birth/Sex: Treating RN: 12/29/1950 (69 y.o. [name] Primary Care Alcus Bradly: [name] Other Clinician: Referring Emory Gallentine: Treating Yolander Goodie/Extender:  Doyle Askew, FRED Weeks in Treatment: 111 Wound Status Wound Number: 64 Primary Diabetic Wound/Ulcer of the Lower Extremity Etiology: Wound Location: Left, Dorsal Foot Wound Open Wounding Event: Gradually Appeared Status: Date Acquired: 06/01/2019 Comorbid Asthma, Hypertension, Peripheral Arterial Disease, Peripheral Weeks Of Treatment: 16 History: Venous Disease, Type II Diabetes, Gout, Osteoarthritis Clustered Wound: No Photos Wound Measurements Length: (cm) 6.5 Width: (cm) 6.5 Depth: (cm) 0.1 Area: (cm) 33.183 Volume: (cm) 3.318 % Reduction in Area: -20010.9% % Reduction in Volume: -6671.4% Epithelialization: Small (1-33%) Tunneling: No Undermining: No Wound Description Classification: Grade 1 Wound Margin: Flat and Intact Exudate Amount: Small Exudate Type: Serous Exudate Color: amber Foul Odor After Cleansing: No Slough/Fibrino Yes Wound Bed Granulation Amount: Small (1-33%) Exposed Structure Granulation Quality: Pink, Pale Fascia Exposed: No Necrotic Amount: Large (67-100%) Fat Layer (Subcutaneous Tissue) Exposed: Yes Necrotic Quality: Adherent Slough Tendon Exposed: No Muscle Exposed: No Joint Exposed: No Bone Exposed: No Electronic Signature(s) Signed: 09/28/2019 3:52:45 PM By: Mikeal Hawthorne EMT/HBOT Signed: 11/07/2019 1:31:05 PM By: Deon Pilling Previous Signature: 09/21/2019 5:46:37 PM Version By: Deon Pilling Entered By: Mikeal Hawthorne on 09/28/2019 15:00:20 -------------------------------------------------------------------------------- Vitals Details Patient Name: Date of Service: [name]. 09/21/2019 10:00 A M Medical Record Number: [id number] Patient Account Number: [id number] Date of Birth/Sex: Treating RN: 24-Jul-1950 (69 y.o. [name] Primary Care Amberlee Garvey: [name] Other Clinician: Referring Tyshea Imel: Treating Jacari Iannello/Extender: Doyle Askew, FRED Weeks in Treatment: 111 Vital Signs Time Taken:  10:25 Temperature (F): 98.1 Height (in): 62 Pulse (bpm): 97 Weight (lbs): 335 Respiratory Rate (breaths/min): 18 Body Mass Index (BMI): 61.3 Blood Pressure (mmHg): 143/76 Reference Range: 80 - 120 mg / dl Electronic Signature(s) Signed: 09/21/2019 5:46:37 PM By: Deon Pilling Entered By: Deon Pilling on 09/21/2019 10:32:51

## 2019-09-21 NOTE — Progress Notes (Addendum)
[name], [name] ([id number]) Visit Report for 09/21/2019 Chief Complaint Document Details Patient Name: Date of Service: [name], [name] 09/21/2019 10:00 AM Medical Record [id number] Patient Account Number: [id number] Date of Birth/Sex: Treating RN: Dec 07, 1950 (69 y.o. [name] Primary Care Provider: Dustin [name] Other Clinician: Referring Provider: Treating Provider/Extender:[name] Weeks in Treatment: 111 Information Obtained from: Patient Chief Complaint Bilateral reoccurring LE ulcers Electronic Signature(s) Signed: 09/21/2019 10:35:29 AM By: [name] Keeler PA-C Entered By: [name] on 09/21/2019 10:35:28 -------------------------------------------------------------------------------- HPI Details Patient Name: Date of Service: [name]. 09/21/2019 10:00 AM Medical Record [id number] Patient Account Number: [id number] Date of Birth/Sex: Treating RN: 1951/04/09 (69 y.o. [name] Primary Care Provider: Dustin [name] Other Clinician: Referring Provider: Treating Provider/Extender:[name] Weeks in Treatment: 111 History of Present Illness HPI Description: this patient has been seen a couple of times before and returns with recurrent problems to her right and left lower extremity with swelling and weeping ulcerations due to not wearing her compression stockings which she had been advised to do during her last discharge, at the end of June 2018. During her last visit the patient had had normal arterial blood flow and her venous reflux study did not necessitate any surgical intervention. She was recommended compression and elevation and wound care. After prolonged treatment the patient was completely healed but she has been noncompliant with wearing or compressions.. She was here last week with an outpatient return visit planned but the patient came in a very poor general condition with altered mental  status and was rushed to the ER on my request. With a history of hypertension, diabetes, TIA and right-sided weakness she was set up for an MRI on her brain and cervical spine and was sent to [facility]. Getting an MRI done was very difficult but once the workup was done she was found not to have any spinal stenosis, epidural abscess or hematoma or discitis. This was radiculopathy to be treated as an outpatient and she was given a follow-up appointment. Today she is feeling much better alert and oriented and has come to reevaluate her bilateral lower extremity lymphedema and ulceration 03/25/2017 -- she was admitted to the hospital on 03/16/2017 and discharged on 03/18/2017 with left leg cellulitis and ulceration. She was started on vancomycin and Zosyn and x-ray showed no bony involvement. She was treated for a cellulitis with IV antibiotics changed to Rocephin and Flagyl and was discharged on oral Keflex and doxycycline to complete a 7 day course. Last hemoglobin A1c was 7.1 and her other ailments including hypertension got asthma were appropriately treated. 05/06/2017 -- she is awaiting the right size of compression stockings from  but other than that has been doing well. ====== Old notes 69 year old patient was seen one time last October and was lost to follow-up. She has recurrent problems with weeping and ulceration of her left lower extremity and has swelling of this for several years. It has been worse for the last 2 months. Past medical history is significant for diabetes mellitus type 2, hypertension, gout, morbid obesity, depressive disorders, hiatal hernia, migraines, status post knee surgery, risk of a cholecystectomy, vaginal hysterectomy and breast biopsy. She is not a smoker. As noted before she has never had a venous duplex study and an arterial ABI study was attempted but the left lower extremity was noncompressible 10/01/2016 -- had a lower extremity venous duplex  reflux evaluation which showed no evidence of deep vein reflux  in the right or left lower extremity, and no evidence of great saphenous vein reflux more than 500 ms in the right or left lower extremity, and the left small saphenous vein is incompetent but no vascular consult was recommended. review of her electronic medical records noted that the ABI was checked in July 2017 where the right ABI was normal limits and the left ABI could not be ascertained due to pain with cuff pressure but the waveforms are within normal limits. her arterial duplex study scheduled for April 27. 10/08/2016 -- the patient has various reasons for not having a compression on and for the last 3 days she has had no compression on her left lower extremity either due to pain or the lack of nursing help. She does not use her juxta lites either. 10/15/2016 -- the patient did not keep her appointment for arterial duplex study on April 27 and I have asked her to reschedule this. Her pain is out of proportion with the physical findings and she continuously fails to wear a compression wraps and cuts them off because she says she cannot tolerate the pain. She does not use her juxta lites either. 10/22/2016 -- he has rescheduled her arterial duplex study to May 21 and her pain today is a bit better. She has not been wearing her juxta lites on her right lower extremity but now understands that she needs to do this. She did tolerate the to press compression wrap on her left lower extremity 10/29/2016 --arterial duplex study is scheduled for next week and overall she has been tolerating her compression wraps and also using her juxta lites on her right lower extremity 11/05/2016 -- the right ABI was 0.95 the left was 1.03. The digit TBI is on the right was 0.83 on the left was 0.92 and she had biphasic flow through these vessels. The impression was that of normal lower extremity arterial study. 11/12/2016 -- her pain is minimal and  she is doing very well overall. 11/26/2016 -- she has got juxta lites and her insurance will not pay for additional dual layer compression stockings. She is going to order some from [place]. 05/12/2017 -- her juxta lites are very old and too big for her and these have not been helping with compression. She did get 20-30 mm compression stockings from [place] but she and her husband are unable to put these on. I believe she will benefit from bilateral Extremit-ease, compression stockings and we will measure her for these today. 05/20/2017 -- lymphedema on the left lower extremity has increased a lot and she has a open ulceration as a result of this. The right lower extremity is looking pretty good. She has decided to by the compression stockings herself and will get reimbursed by the home health, at a later date. 05/27/2017 -- her sciatica is bothering her a lot and she thought her left leg pain was caused due to the compression wrap and hence removed it and has significant lymphedema. There is no inflammation on this left lower extremity. 06/17/17 on evaluation today patient appears to be doing very well and in fact is completely healed in regard to her ulcerations. Unfortunately however she does have continued issues with lymphedema nonetheless. We did order compression garments for her unfortunately she states that the size that she received were large although we ordered medium. Obviously this means she is not getting the optimal compression. She does not have those with her today and therefore we could not confirm and contact  the company on her behalf. Nonetheless she does state that she is going to have her husband bring them by tomorrow so that we can verify and then get in touch with the company. No fevers, chills, nausea, or vomiting noted at this time. Overall patient is doing better otherwise and I'm pleased with the progress she has made. 07/01/17 on evaluation today patient appears to  be doing very well in regard to her bilateral lower extremity she does not have any openings at this point which is excellent news. Overall I'm pleased with how things have progressed up to this time. Since she is doing so well we did order her compression which we are seeing her today to ensure that it fits her properly and everything is doing well in that regard and then subsequently she will be discharged. ============ Old Notes: 03/31/16 patient presents today for evaluation concerning open wounds that she has over the left medial ankle region as well as the left dorsal foot. She has previously had this occur although it has been healed for a number of months after having this for about a year prior until her hospitalization on 01/05/16. At that point in time it appears that she was admitted to the hospital for left lower extremity cellulitis and was placed on vancomycin and Zosyn at that point. Eventually upon discharge on January 15, 2016 she was placed on doxycycline at that point in time. Later on 03/27/16 positive wound culture growing Escherichia coli this was switched to amoxicillin. Currently she tells me that she is having pain radiated to be a 7 out of 10 which can be as high as 10 out of 10 with palpation and manipulation of the wound. This wound appears to be mainly venous in nature due to the bilateral lower extremity venous stasis/lymphedema. This is definitely much worse on her left than the right side. She does have type 1 diabetes mellitus, hypertension, morbid obesity, and is wheelchair dependent.during the course of the hospital stay a blood culture was also obtained and fortunately appeared negative. She also had an x-ray of the tibia/fibula on the left which showed no acute bone abnormality. Her white blood cell count which was performed last on 03/25/16 was 7.3, hemoglobin 12.8, protein 7.1, albumin 3.0. Her urine culture appeared to be negative for any specific  organisms. Patient did have a left lower extremity venous duplex evaluation for DVT. This did not include venous reflux studies but fortunately was negative for DVT. Patient also had arterial studies performed which revealed that she had a normal ABI on the right though this was unable to be performed on the left secondary to pain that she was having around the ankle region due to the wound. However it was stated on report that she had biphasic pulses and apparently good blood flow. ========== 06/03/17 she is here in follow-up evaluation for right lower extremity ulcer. The right lower sure he has healed but she has reopened to the left medial malleolus and dorsal foot with weeping. She is waiting for new compression garments to arrive from home health, the previous compression garments were ill fitting. We will continue with compression bilaterally and follow-up in 2 weeks Readmission: 08/05/17 on evaluation today patient appears to be doing somewhat poorly in regard to her left lower extremity especially although the right lower extremity has a small area which may no longer be open. She has been having a lot of drainage from the left lower extremity however he tells me that  she has not been able to use the EXTREMIT- EASE Compression at this point. She states that she did better and was able to actually apply the Juxta-Lite compression although the wound that she has is too large and therefore really does not compress which is why she cannot wear it at this point. She has no one who can help her put it on regular basis her son can sometimes but he's not able to do it most of the time. I do believe that's why she has begun to weave and have issues as she is currently yet again. No fevers, chills, nausea, or vomiting noted at this time. Patient is no evidence of dementia. 08/12/17 on evaluation today patient appears to still be doing fairly well in regard to the draining areas/weeping areas at  this point. With that being said she unfortunately did go to the ER yesterday due to what was felt to be possibly a cellulitis. They place her on doxycycline by mouth and discharge her home. She definitely was not admitted. With that being said she states she has had more discomfort which has been unusual for her even compared to prior times and she's had infections.08/12/17 on evaluation today patient appears to still be doing fairly well in regard to the draining areas/weeping areas at this point. With that being said she unfortunately did go to the ER yesterday due to what was felt to be possibly a cellulitis. They place her on doxycycline by mouth and discharge her home. She definitely was not admitted. With that being said she states she has had more discomfort which has been unusual for her even compared to prior times and she's had infections. 08/19/17 put evaluation today patient tells me that she's been having a lot of what sounds to be neuropathic type pain in regard to her left lower extremity. She has been using over-the-counter topical bins again which some believe. That in order to apply the she actually remove the wrap we put on her last Wednesday on Thursday. Subsequently she has not had anything on compression wise since that time. The good news is a lot of the weeping areas appear to have closed at this point again I believe she would do better with compression but we are struggling to get her to actually use what she needs to at this point. No fevers, chills, nausea, or vomiting noted at this time. 09/03/17 on evaluation today patient appears to be doing okay in regard to her lower extremities in regard to the lymphedema and weeping. Fortunately she does not seem to show any signs of infection at this point she does have a little bit of weeping occurring in the right medial malleolus area. With that being said this does not appear to be too significant which is good news. 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 She has severe skin damage secondary to both of these features involving the dorsal left foot and medial left ankle and lower leg. Still has open areas in the left anterior foot. The area on the right closed over. She uses her own juxta light stockings. She does not have an arterial issue 09/16/17 on evaluation today patient actually appears to be doing excellent in regard to her bilateral lower extremity swelling. The Juxta-Lite compression wrap seem to be doing very well for her. She has not however been using the portion that goes over her foot. Her left foot still is draining a  little bit not nearly as significant as it has been in the past but still I do believe that she likely needs to utilize the full wrap including the foot portion of this will improve as well. She also has been apparently putting on a significant amount of Vaseline which also think is not helpful for her. I recommended that if she feels she needs something for moisturizer Eucerin will probably be better. 09/30/17 on evaluation today patient presents with several new open areas in regard to her left lower extremity although these appear to be minimal and mainly seem to be more moisture breakdown than anything. Fortunately she does not seem to have any evidence of infection which is great news. She has been tolerating the dressing changes without complication we are using silver alginate on the foot she has been using AB pads to have the legs and using her Juxta-Lite compression which seems to be controlling her swelling very well. Overall I'm pleased with the poor way she has progressed. 10/14/17 on evaluation today patient appears to be doing better in regard to her left lower extremity areas of weeping. She does still have some discomfort although in general this does not appear to be as macerated and I think it  is progressing nicely. I do think she still needs to wear the foot portion of her Juxta-Lite in order to get the most benefit from the wrap obviously. She states she understands. Fortunately there does not appear to be evidence of infection at this time which is great news. 10/28/17 on evaluation today patient appears to be doing excellent in regard to her left lower extremity. She has just a couple areas that are still open and seem to be causing any trouble whatsoever. For that reason I think that she is definitely headed in the right direction the spots are very tiny compared to what we have been dealing with in the past. 11/11/17 on evaluation today patient appears to have a right lateral lower extremity ulcer that has opened since I last saw her. She states this is where the home health nurse that was coming out remove the dressing without wetting the alginate first. Nonetheless I do not know if this is indeed the case or not but more importantly we have not ordered home help to be coming out for her wounds at all. I'm unsure as to why they are coming out and we're gonna have to check on this and get things situated in that regard. With that being said we currently really do not need them to be coming out as the patient has been taking care of her leg herself without complication and no issues. In fact she was doing much better prior to nursing coming out. 11/25/17 on evaluation today patient actually appears to be doing fairly well in regard to her left lower extremity swelling. In fact she has very little area of weeping at this point there's just a small spot on the lateral portion of her right leg that still has me just a little bit more concerned as far as wanting to see this clear up before I discharge her to caring for this at home. Nonetheless overall she has made excellent progress. 12/09/17 on evaluation today patient appears to be doing rather well in regard to her lower extremity  edema. She does have some weeping still in the left lower extremity although the big area we were taking care of two weeks ago actually has closed and she has another  area of weeping on the left lower extremity immediately as well is the top of her foot. She does not currently have lymphedema pumps she has been wearing her compression daily on a regular basis as directed. With that being said I think she may benefit from lymphedema pumps. 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 We had a very difficult time getting and keeping this under control. 12/23/17 on evaluation today patient actually appears to be doing a little bit more poorly in regard to her bilateral lower extremities. She has been tolerating the Juxta-Lite compression wraps. Unfortunately she has two new ulcers on the right lower extremity and left lower Trinity ulceration seems to be larger. Obviously this is not good news. She has been tolerating the dressings without complication. 12/30/17 on evaluation today patient actually appears to be doing much better in regard to her bilateral lower extremity edema. She continues to have some issues with ulcerations and in fact there appears to be one spot on each leg where the wrap may have caused a little bit of a blister which is subsequently opened up at this point is given her pain. Fortunately it does not appear to be any evidence of infection which is good news. No fevers chills noted. 01/13/18 on evaluation today patient appears to be doing rather well in regard to her bilateral lower extremities. The dressings did get kind of stuck as far as the wound beds are concerned but again I think this is mainly due to the fact that she actually seems to be showing signs of healing which is good news. She's not having as much drainage  therefore she was having more of the dressing sticking. Nonetheless overall I feel like her swelling is dramatically down compared to previous. 01/20/18 on evaluation today patient unfortunately though she's doing better in most regards has a large blister on the left anterior lower extremity where she is draining quite significantly. Subsequently this is going to need debridement today in order to see what's underneath and ensure she does not continue to trapping fluid at this location. Nonetheless No fevers, chills, nausea, or vomiting noted at this time. 01/27/18 on evaluation today patient appears to be doing rather well at this point in regard to her right lower extremity there's just a very small area that she still has open at this point. With that being said I do believe that she is tolerating the compression wraps very well in making good progress. Home health is coming out at this point to see her. Her left lower extremity on the lateral portion is actually what still mainly open and causing her some discomfort for the most part 02/10/18 on evaluation today patient actually appears to be doing very well in regard to her right lower extremity were all the ulcers appear to be completely close. In regard to the left lower extremity she does have two areas still open and some leaking from the dorsal surface of her foot but this still seems to be doing much better to me in general. 02/24/18 on evaluation today patient actually appears to be doing much better in regard to her right lower extremity this is still completely healed. Her left lower extremity is also doing much better fortunately she has no evidence of infection. The one area that is gonna require some debridement is still  on the left anterior shin. Fortunately this is not hurting her as badly today. 03/10/18 on evaluation today patient appears to be doing better in some regards although she has a little bit more open area on the dorsal  foot and she also has some issues on the medial portion of the left lower extremity which is actually new and somewhat deep. With that being said there fortunately does not appear to be any significant signs of infection which is good news. No fevers, chills, nausea, or vomiting noted at this time. In general her swelling seems to be doing fairly well which is good news. 03/31/18 on evaluation today patient presents for follow-up concerning her left lower extremity lymphedema. Unfortunately she has been doing a little bit more poorly since I last saw her in regard to the amount of weeping that she is experiencing. She's also having some increased pain in the anterior shin location. Unfortunately I do not feel like the patient is making such good progress at this point a few weeks back she was definitely doing much better. 04/07/18 on evaluation today patient actually appears to be showing some signs of improvement as far as the left lower extremity is concerned. She has been tolerating the dressing changes and it does appear that the Drawtex did better for her. With that being said unfortunately home health is stating that they cannot obtain the Drawtex going forward. Nonetheless we're gonna have to check and see what they may be able to get the alginate they were using was getting stuck in causing new areas of skin being pulled all that with and subsequently weep and calls her to worsen overall this is the first time we've seen improvement at this time. 04/14/18 on evaluation today patient actually appears to be doing rather well at this point there does not appear to be any evidence of infection at this time and she is actually doing excellent in regard to the weeping in fact she almost has no openings remaining even compared to just last week this is a dramatic improvement. No fevers chills noted 04/21/18 evaluation today patient actually appears to be doing very well. She in fact is has a small  area on the posterior lower extremity location and she has a small area on the dorsal surface of her foot that are still open both of which are very close to closing. We're hoping this will be close shortly. She brought her Juxta-Lite wrap with her today hoping that would be able to put her in it unfortunately I don't think were quite at that point yet but we're getting closer. 04/28/18 upon evaluation today patient actually appears to be doing excellent in regard to her left lower extremity ulcer. In fact the region on the posterior lower extremity actually is much smaller than previously noted. Overall I'm very happy with the progress she has made. She again did bring her Juxta-Lite although we're not quite ready for that yet. 05/11/18 upon evaluation today patient actually appears to be doing in general fairly well in regard to her left lower Trinity. The swelling is very well controlled. With that being said she has a new area on the left anterior lower extremity as well as between the first and second toes of her left foot that was not present during the last evaluation. The region of her posterior left lower extremity actually appears to be almost completely healed. To be honest I'm very pleased with the way that stands. Nonetheless I do believe  that the lotion may be keeping the area to moist as far as her legs are concerned subsequently I'm gonna consider discontinuing that today. 05/26/18 on evaluation today patient appears to be doing rather well in regard to her left lower should be ulcers. In fact everything appears to be close except for a very small area on the left posterior lower extremity. Fortunately there does not appear to be any evidence of infection at this time. Overall very pleased with her progress. 06/02/18 and evaluation today patient actually appears to be doing very well in regard to her lower extremity ulcers. She has one small area that still continues to weep that I  think may benefit her being able to justify lotion and user Juxta-Lite wraps versus continued to wrap her. Nonetheless I think this is something we can definitely look into at this point. 06/23/18 on evaluation today patient unfortunately has openings of her bilateral lower extremities. In general she seems to be doing much worse than when I last saw her just as far as her overall health standpoint is concerned. She states that her discomfort is mainly due to neuropathy she's not having any other issues otherwise. No fevers, chills, nausea, or vomiting noted at this time. 06/30/18 on evaluation today patient actually appears to be doing a little worse in regard to her right lower extremity her left lower extremity of doing fairly well. Fortunately there is no sign of infection at this time. She has been tolerating the dressing changes without complication. Home health did not come out like they were supposed to for the appropriate wrap changes. They stated that they never received the orders from [place] which were fax. Nonetheless we will send a copy of the orders with the patient today as well. 07/07/18 on evaluation today patient appears to be doing much better in regard to lower extremities. She still has several openings bilaterally although since I last saw her her legs did show obvious signs of infection when she later saw her nurse. Subsequently a culture was obtained and she is been placed on Bactrim and Keflex. Fortunately things seem to be looking much better it does appear she likely had an infection. Again last week we'd even discussed it but again there really was not any obvious sign that she had infection therefore we held off on the antibiotics. Nonetheless I'm glad she's doing better today. 07/14/18 on evaluation today patient appears to be doing much better regarding her bilateral lower Trinity's. In fact on the right lower for me there's nothing open at this point there are some dry skin  areas at the sites where she had infection. Fortunately there is no evidence of systemic infection which is excellent news. No fevers chills noted 07/21/18 on evaluation today patient actually appears to be doing much better in regard to her left lower extremity ulcers. She is making good progress and overall I feel like she's improving each time I see her. She's having no pain I do feel like the infection is completely resolved which is excellent news. No fevers, chills, nausea, or vomiting noted at this time. 07/28/18 on evaluation today patient appears to be doing very well in regard to her left lower [employer]. Everything seems to be showing signs of improvement which is excellent news. Overall very pleased with the progress that has been made. Fortunately there's no evidence of active infection at this time also excellent news. 08/04/18 on evaluation today patient appears to be doing more poorly in regard  to her bilateral lower extremities. She has two new areas open up on the right and these were completely closed as of last week. She still has the two spots on the left which in my pinion seem to be doing better. Fortunately there's no evidence of infection again at this point. 08/11/18 on evaluation today patient actually appears to be doing very well in regard to her bilateral lower Trinity wounds that all seem to be doing better and are measures smaller today. Fortunately there's no signs of infection. No fevers, chills, nausea, or vomiting noted at this time. 08/18/18 on evaluation today patient actually appears to be doing about the same inverter bilateral lower extremities. She continues to have areas that blistering open as was drain that fortunately nothing too significant. Overall I feel like Drawtex may have done better for her however compared to the collagen. 08/25/18 on evaluation today patient appears to be doing a little bit more poorly today even compared to last time I saw  her. Again I'm not exactly sure why she's making worse progress over the past several weeks. I'm beginning to wonder if there is some kind of underlying low level infection causing this issue. I did actually take a culture from the left anterior lower extremity but it was a new wound draining quite a bit at this point. Unfortunately she also seems to be having more pain which is what also makes me worried about the possibility of infection. This is despite never erythema noted at this point. 09/01/18 on evaluation today patient actually appears to be doing a little worse even compared to last week in regard to bilateral lower extremities. She did go to the hospital on the 16th was given a dose of IV Zosyn and then discharged with a recommendation to continue with the Bactrim that I previously prescribed for her. Nonetheless she is still having a lot of discomfort she tells me as well at this time. This is definitely unfortunate. No fevers, chills, nausea, or vomiting noted at this time. 09/08/18 on evaluation today patient's bilateral lower extremities actually appear to be shown signs of improvement which is good news. Fortunately there does not appear to be any signs of active infection I think the anabiotic is helping in this regard. Overall I'm very pleased with how she is progressing. 09/15/18 patient was actually seen in ER yesterday due to her legs as well unfortunately. She states she's been having a lot of pain and discomfort as well as a lot of drainage. Upon inspection today the patient does have a lot of swelling and drainage I feel like this is more related to lymphedema and poor fluid control than it is to infection based on what I'm seeing. The physician in the emergency department also doubted that the patient was having a significant infection nonetheless I see no evidence of infection obvious at this point although I do see evidence of poor fluid control. She still not using a  compression pumps, she is not elevating due to her lift chair as well as her hospital bed being broken, and she really is not keeping her legs up as much as they should be and also has been taking off her wraps. All this combined I think has led to poor fluid control and to be honest she may be somewhat volume overloaded in general as well. I recommend that she may need to contact your physician to see if a prescription for a diuretic would be beneficial in their opinion.  As long as this is safe I think it would likely help her. 09/29/18 on evaluation today patient's left lower extremity actually appears to be doing quite a bit better. At least compared to last time that I saw her. She still has a large area where she is draining from but there's a lot of new skin speckled trout and in fact there's more new skin that there are open areas of weeping and drainage at this point. This is good news. With regard to the right lower extremity this is doing much better with the only open area that I really see being a dry spot on the right lateral ankle currently. Fortunately there's no signs of active infection at this time which is good news. No fevers, chills, nausea, or vomiting noted at this time. The patient seems somewhat stressed and overwhelmed during the visit today she was very lethargic as such. She does and she is not taking any pain medications at this point. Apparently according to her husband are also in the process of moving which is probably taking its toll on her as well. 10/06/18 on evaluation today patient appears to be doing rather well in regard to her lower extremities compared to last evaluation. Fortunately there's no signs of active infection. She tells me she did have an appointment with her primary. Nonetheless he was concerned that the wounds were somewhat deep based on pictures but we never actually saw her legs. She states that he had her somewhat worried due to the fact that  she was fearing now that she was [place] have to have an amputation. With that being said based on what I'm seeing check she looks better this week that she has the last two times I've seen her with much less drainage I'm actually pleased in this regard. That doesn't mean that she's out of the water but again I do not think what the point of talking about education at all in regard to her leg. She is very happy to hear this. She is also not having as much pain as she was having last week. 10/13/18 unfortunately on evaluation today patient still continues to have a significant amount of drainage she's not letting home health actually apply the compression dressings at this point. She's trying to use of Juxta-Lite of the top of Kerlex and the second layer of the three layer compression wrap. With that being said she just does not seem to be making as good a progress as I would expect if she was having the compression applied and in place on a regular basis. No fevers, chills, nausea, or vomiting noted at this time. 10/20/18 on evaluation today patient appears to be doing a little better in regard to her bilateral lower extremity ulcers. In fact the right lower extremity seems to be healed she doesn't even have any openings at this point left lower extremity though still somewhat macerated seems to be showing signs of new skin growth at multiple locations throughout. Fortunately there's no evidence of active infection at this time. No fevers, chills, nausea, or vomiting noted at this time. 10/27/18 on evaluation today patient appears to be doing much better in regard to her left lower Trinity ulcer. She's been tolerating the laptop complication and has minimal drainage noted at this point. Fortunately there's no signs of active infection at this time. No fevers, chills, nausea, or vomiting noted at this time. 11/03/18 on evaluation today patient actually appears to be doing excellent in regard to  her left  lower extremity. She is having very little drainage at this point there does not appear to be any significant signs of infection overall very pleased with how things have gone. She is likewise extremely pleased still and seems to be making wonderful progress week to week. I do believe antibiotics were helpful for her. Her primary care provider did place on amateur clean since I last saw her. 11/17/18 on evaluation today patient appears to be doing worse in regard to her bilateral lower extremities at this point. She is been tolerating the dressing changes without complication. With that being said she typically takes the Coban off fairly quickly upon arriving home even after being seen here in the clinic and does not allow home health reapply command as part of the dressing at home. Therefore she said no compression essentially since I last saw her as best I can tell. With that being said I think it shows and how much swelling she has in the open wounds that are noted at this point. Fortunately there's no signs of infection but unfortunately if she doesn't get this under control I think she will end up with infection and more significant issues. 11/24/18 on evaluation today patient actually appears to be doing somewhat better in regard to her bilateral lower extremities. 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 Nonetheless that is doing better currently so she should be able to attempt reinitiating the lymphedema pumps at this time. No fevers, chills, nausea, or vomiting noted at this time. 12/01/18 upon evaluation today patient's left lower extremity appears to be doing quite well unfortunately her right lower extremity is not doing nearly as well. She has been tolerating the dressing changes without complication unfortunately she did not keep a wrap on the  right at this time. Nonetheless I believe this has led to increased swelling and weeping in the world is actually much larger than during the last evaluation with her. 12/08/18 on evaluation today patient appears to be doing about the same at this point in regard to her right lower extremity. There is some more palatable to touch I'm concerned about the possibility of there being some infection although I think the main issue is she's not keeping her compression wrap on which in turn is not allowing this area to heal appropriately. 12/22/18 on evaluation today patient appears to be doing better in regard to left lower extremity unfortunately significantly worse in regard to the right lower extremity. The areas of blistering and necrotic superficial tissue have spread and again this does not really appear to be signs of infection and all she just doesn't seem to be doing nearly as well is what she has been in the past. Overall I feel like the Augmentin did absolutely nothing for her she doesn't seem to have any infection again I really didn't think so last time either is more of a potential preventative measure and hoping that this would make some difference but I think the main issue is she's not wearing her compression. She tells me she cannot wear the [place] been we put on she takes it off pretty much upon getting home. Subsequently she worshiped Juxta-Lite when I questioned her about how often she wears it this is no more than three hours a day obviously that leaves 21  hours that she has no compression and this is obviously not doing well for her. Overall I'm concerned that if things continue to worsen she is at great risk of both infection as well as losing her leg. 01/05/19 on evaluation today patient appears to be doing well in regard to her left lower extremity which he is allowing [place] to wrap and not so well with regard to her right lower extremity which she is not allowing [place] to really wrap  and keep the wrap on. She states that it hurts too badly whenever it's wrapped and she ends up having to take it off. She's been using the Juxta-Lite she tells me up to six hours a day although I question whether or not that's really been the case to be honest. Previously she told me three hours today nonetheless obviously the legs as long as the wrap is doing great when she is not is doing much more poorly. 01/12/2019 on evaluation today patient actually appears to be doing a little better in my opinion with regard to her right lower extremity ulcer. She has a small open area on the left lower extremity unfortunately but again this I think is part of the normal fluctuation of what she is going to have to expect with regard to her legs especially when she is not using her lymphedema pumps on a regular basis. Subsequently based on what I am seeing today I think that she does seem to be doing slightly better with regard to her right lower extremity she did see her primary care provider on Monday they felt she had an infection and placed her on 2 antibiotics. Both Cipro and clindamycin. Subsequently again she seems possibly to be doing a little bit better in regards to the right lower extremity she also tells me however she has been wearing the compression wrap over the past week since I spoke with her as well that is a Kerlix and Coban wrap on the right. No fevers, chills, nausea, vomiting, or diarrhea. 01/19/2019 on evaluation today patient appears to be doing better with regard to her bilateral lower extremities especially the right. I feel like the compression has been beneficial for her which is great news. She did get a call from her primary care provider on her way here today telling her that she did have methicillin-resistant Staphylococcus aureus and he was calling in a couple new antibiotics for her including a ointment to be applied she tells me 3 times a day. With that being said this sounds  like likely to be Bactroban which I think could be applied with each dressing/wrap change but I would not be able to accommodate her applying this 3 times a day. She is in agreement with the least doing this we will add that to her orders today. 01/26/2019 on evaluation today patient actually appears to be doing much better with regard to her right lower extremity. Her left lower extremity is also doing quite well all things considering. Fortunately there is no evidence of active infection at this time. No fevers, chills, nausea, vomiting, or diarrhea. 02/02/2019 on evaluation today patient appears to be doing much better compared to her last evaluation. Little by little off like her right leg is returning more towards normal. There does not appear to be any signs of active infection and overall she seems to be doing quite well which is great news. I am very pleased in this regard. No fevers, chills, nausea, vomiting, or diarrhea. 02/09/2019  upon evaluation today patient appears to be doing better with regard to her bilateral lower extremities. She has been tolerating the dressing changes without complication. Fortunately there is no signs of active infection at this time. No fevers, chills, nausea, vomiting, or diarrhea. 02/23/2019 on evaluation today patient actually appears to be doing quite well with regard to her bilateral lower extremities. She has been tolerating the dressing changes without complication. She is even used her pumps one time and states that she really felt like it felt good. With that being said she seems to be in good spirits and her legs appear to be doing excellent. 03/09/2019 on evaluation today patient appears to be doing well with regard to her right lower extremity there are no open wounds at this time she is having some discomfort but I feel like this is more neuropathy than anything. With regard to her left lower extremity she had several areas scattered around that she  does have some weeping and drainage from but again overall she does not appear to be having any significant issues and no evidence of infection at this time which is good news. 03/23/2019 on evaluation today patient appears to be doing well with regard to her right lower extremity which she tells me is still close she is using her juxta light here. Her left lower extremity she mainly just has an area on the foot which is still slightly draining although this also is doing great. Overall very pleased at this time. 04/06/2019 patient appears to be doing a little bit worse in regard to her left lower extremity upon evaluation today. She feels like this could be becoming infected again which she had issues with previous. Fortunately there is no signs of systemic infection but again this is always a struggle with her with her legs she will go from doing well to not so well in a very short amount of time. 04/20/2019 on evaluation today patient actually appears to be doing quite well with regard to her right lower extremity I do not see any signs of active infection at this time. Fortunately there is no fever chills noted. She is still taking the antibiotics which I prescribed for her at this point. In regard to the left lower extremity I do feel like some of these areas are better although again she still is having weeping from several locations at this time. 04/27/2019 on evaluation today patient appears to be doing about the same if not slightly worse in regard to her left lower extremity ulcers. She tells me when questioned that she has been sleeping in her Hoveround chair in fact she tells me she falls asleep without even knowing it. I think she is spending a whole lot of time in the chair and less time walking and moving around which is not good for her legs either. On top of that she is in a seated position which is also the worst position she is not really elevating her legs and she is also not  using her lymphedema pumps. All this is good to contribute to worsening of her condition in general. 05/18/2019 on evaluation today patient appears to be doing well with regard to her lower extremity on the right in fact this is showing no signs of any open wounds at this time. On the left she is continuing to have issues with areas that do drain. Some of the regions have healed and there are couple areas that have reopened. She did go to  the ER per the patient according to recommendations from the home health nurse due to what she was seen when she came out on 05/13/2019. Subsequently she felt like the patient needed to go to the hospital due to the fact that again she was having "milky white discharge" from her leg. Nonetheless she had and then was placed on doxycycline and subsequently seems to be doing better. 06/01/2019 upon evaluation today patient appears to be doing really in my opinion about the same. I do not see any signs of active infection which is good news. Overall she still has wounds over the bilateral lower extremities she has reopened on the right but this appears to be more of a crack where there is weeping/edema coming from the region. I do not see any evidence of infection at either site based on what I visualized today. 07/13/2019 upon evaluation today patient appears to be doing a little worse compared to last time I saw her. She since has been in the hospital from 06/21/2019 through 06/29/2019. This was secondary to having Covid. During that time they did apply lotion to her legs which unfortunately has caused her to develop a myriad of open wounds on her lower extremities. Her legs do appear to be doing better as far as the overall appearance is concerned but nonetheless she does have more open and weeping areas. 07/27/2019 upon evaluation today patient appears to be doing more poorly to be honest in regard to her left lower extremity in particular. There is no signs of  systemic infection although I do believe she may have local infection. She notes she has been having a lot of blue/green drainage which is consistent potentially with Pseudomonas. That may be something that we need to consider here as well. The doxycycline does not seem to have been helping. 08/03/2019 upon evaluation today patient appears to be doing a little better in my opinion compared to last week's evaluation. Her culture I did review today and she is on appropriate medications to help treat the Enterobacter that was noted. Overall I feel like that is good news. With that being said she is unfortunately continuing to have a lot of drainage and though it is doing better I still think she has a long ways to go to get things dried up in general. Fortunately there is no signs of systemic infection. 08/10/2019 upon evaluation today patient appears to be doing may be slightly better in regard to her left lower extremity the right lower extremity is doing much better. Fortunately there is no signs of infection right now which is good news. No fevers, chills, nausea, vomiting, or diarrhea. 08/24/2019 on evaluation today patient appears to be doing slightly better in regard to her lower extremities. The left lower extremity seems to be healed the right lower extremity is doing better though not completely healed as far as the openings are concerned. She has some generalized issues here with edema and weeping secondary to her lymphedema though again I do believe this is little bit drier compared to prior weeks evaluations. In general I am very pleased with how things seem to be progressing. No fevers, chills, nausea, vomiting, or diarrhea. 08/31/2019 upon evaluation today patient actually seems to making some progress here with regard to the left lower extremity in particular. She has been tolerating the dressing changes without complication. Fortunately there is no signs of active infection at this time.  No fevers, chills, nausea, vomiting, or diarrhea. She did see Dr. Doren Custard  and he did note that she did have a issue with the left great saphenous vein and the small saphenous vein in the leg. With that being said he was concerned about the possibility of laser ablation not being extremely successful. He also mentioned a small risk of DVT associated with the procedure. However if the wounds do not continue to improve he stated that that would probably be the way to go. Fortunately the patient's legs do seem to be doing much better. 09/07/2019 upon evaluation today patient appears to be doing better with regard to her lower extremities. She has been tolerating the dressing changes without complication. With that being said she is showing signs of improvement and overall very pleased. There are some areas on her leg that I think we do need to debride we discussed this last week the patient is in agreement with doing that as long as it does not hurt too badly. 09/14/2019 upon evaluation today patient appears to be doing decently well with regard to her left lower extremity. She is not having near as much weeping as she has had in the past things seem to be drying up which is good news. There is no signs of active infection at this time. 09/21/19 upon evaluation today patient appears to be doing better in regard overall to her bilateral lower extremities. She again has less open than she did previous and each week I feel like this is getting better. Fortunately there is no signs of active infection at this time. No fevers, chills, nausea, vomiting, or diarrhea. Electronic Signature(s) Signed: 09/21/2019 6:08:41 PM By: [name] Keeler PA-C Entered By: [name] on 09/21/2019 18:08:41 -------------------------------------------------------------------------------- Physical Exam Details Patient Name: Date of Service: LAURALI, GODDARD 09/21/2019 10:00 AM Medical Record [id number] Patient Account  Number: [id number] Date of Birth/Sex: Treating RN: [birth year] (69 y.o. [name] Primary Care Provider: Dustin [name] Other Clinician: Referring Provider: Treating Provider/Extender:[name] Weeks in Treatment: 111 Constitutional Obese and well-hydrated in no acute distress. Respiratory normal breathing without difficulty. Psychiatric this patient is able to make decisions and demonstrates good insight into disease process. Alert and Oriented x 3. pleasant and cooperative. Notes Patient's wounds currently show signs of improvement she does have some slough noted I was able to mechanically debride some of this away. With that being said overall I feel like she has less open on the left lower extremity and I did not see anything actually open on the right lower extremity either. Her left foot also is doing significantly better. Electronic Signature(s) Signed: 09/21/2019 6:09:02 PM By: [name] Keeler PA-C Entered By: [name] on 09/21/2019 18:09:02 -------------------------------------------------------------------------------- Physician Orders Details Patient Name: Date of Service: [name]. 09/21/2019 10:00 AM Medical Record [id number] Patient Account Number: [id number] Date of Birth/Sex: Treating RN: 27-Jun-1950 (68 y.o. [name] Primary Care Provider: Dustin [name] Other Clinician: Referring Provider: Treating Provider/Extender:[name] Weeks in Treatment: [phone] Verbal / Phone Orders: No Diagnosis Coding ICD-10 Coding Code Description E11.622 Type 2 diabetes mellitus with other skin ulcer I89.0 Lymphedema, not elsewhere classified I87.331 Chronic venous hypertension (idiopathic) with ulcer and inflammation of right lower extremity I87.332 Chronic venous hypertension (idiopathic) with ulcer and inflammation of left lower extremity L97.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 leg with fat  layer exposed L97.822 Non-pressure chronic ulcer of other part of left lower leg with fat layer exposed L97.522 Non-pressure chronic ulcer of other part of  left foot with fat layer exposed I10 Essential (primary) hypertension E66.01 Morbid (severe) obesity due to excess calories F41.8 Other specified anxiety disorders R53.1 Weakness Follow-up Appointments Return Appointment in 1 week. Dressing Change Frequency Wound #61 Left,Circumferential Lower Leg Change dressing three times week. Wound #64 Left,Dorsal Foot Change dressing three times week. Skin Barriers/Peri-Wound Care Barrier cream - zinc oxide cream to any macerated areas Moisturizing lotion - to right leg daily Wound Cleansing Clean wound with Wound Cleanser - all wounds May shower with protection. Primary Wound Dressing Wound #61 Left,Circumferential Lower Leg Calcium Alginate with Silver Wound #64 Left,Dorsal Foot Calcium Alginate with Silver Secondary Dressing Wound #61 Left,Circumferential Lower Leg Dry Gauze - all wounds ABD pad - as needed Zetuvit or Kerramax - as needed Wound #64 Left,Dorsal Foot Dry Gauze - all wounds ABD pad - as needed Edema Control 3 Layer Compression System - Left Lower Extremity Avoid standing for long periods of time - walking is encouraged Elevate legs to the level of the heart or above for 30 minutes daily and/or when sitting, a frequency of: - do not sleep in chair with feet dangling, MUST elevate legs while sitting Exercise regularly Support Garment 20-30 mm/Hg pressure to: - Juxtalite to right lower leg daily Segmental Compressive Device. - lymphedema pumps 60 minutes 1- 2 times per day Off-Loading Turn and reposition every 2 hours Additional Orders / Instructions Follow Nutritious Diet - To include vitamin A, vitamin C, and Zinc along with increased protein intake. [place] skilled nursing for wound care. - [facility] Electronic Signature(s) Signed:  09/21/2019 6:07:56 PM By: [name] RN, BSN Signed: 09/21/2019 7:02:55 PM By: [name] Keeler PA-C Entered By: [name] on 09/21/2019 11:25:23 -------------------------------------------------------------------------------- Problem List Details Patient Name: Date of Service: [name]. 09/21/2019 10:00 AM Medical Record [id number] Patient Account Number: [id number] Date of Birth/Sex: Treating RN: 1951-05-23 (69 y.o. [name] Primary Care Provider: Dustin [name] Other Clinician: Referring Provider: Treating Provider/Extender:[name] Weeks in Treatment: [phone] Active Problems ICD-10 Evaluated Encounter Code Description Active Date Today Diagnosis E11.622 Type 2 diabetes mellitus with other skin ulcer 08/05/2017 No Yes I89.0 Lymphedema, not elsewhere classified 08/05/2017 No Yes I87.331 Chronic venous hypertension (idiopathic) with ulcer 08/05/2017 No Yes and inflammation of right lower extremity I87.332 Chronic venous hypertension (idiopathic) with ulcer 08/05/2017 No Yes and inflammation of left lower extremity L97.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 08/05/2017 No Yes leg with fat layer exposed L97.822 Non-pressure chronic ulcer of other part of left lower 08/05/2017 No Yes leg with fat layer exposed L97.522 Non-pressure chronic ulcer of other part of left foot 06/01/2019 No Yes with fat layer exposed I10 Essential (primary) hypertension 08/05/2017 No Yes E66.01 Morbid (severe) obesity due to excess calories 08/05/2017 No Yes F41.8 Other specified anxiety disorders 08/05/2017 No Yes R53.1 Weakness 08/05/2017 No Yes Inactive Problems Resolved Problems Electronic Signature(s) Signed: 09/21/2019 10:26:03 AM By: [name] Keeler PA-C Entered By: [name] on 09/21/2019 10:26:02 -------------------------------------------------------------------------------- Progress Note Details Patient Name: Date of  Service: [name]. 09/21/2019 10:00 AM Medical Record [id number] Patient Account Number: [id number] Date of Birth/Sex: Treating RN: Mar 26, 1951 (69 y.o. [name] Primary Care Provider: Dustin [name] Other Clinician: Referring Provider: Treating Provider/Extender:[name] Weeks in Treatment: 111 Subjective Chief Complaint Information obtained from Patient Bilateral reoccurring LE ulcers History of Present Illness (HPI) this patient has been seen a couple of times before and returns with  recurrent problems to her right and left lower extremity with swelling and weeping ulcerations due to not wearing her compression stockings which she had been advised to do during her last discharge, at the end of June 2018. During her last visit the patient had had normal arterial blood flow and her venous reflux study did not necessitate any surgical intervention. She was recommended compression and elevation and wound care. After prolonged treatment the patient was completely healed but she has been noncompliant with wearing or compressions.. She was here last week with an outpatient return visit planned but the patient came in a very poor general condition with altered mental status and was rushed to the ER on my request. With a history of hypertension, diabetes, TIA and right-sided weakness she was set up for an MRI on her brain and cervical spine and was sent to [facility]. Getting an MRI done was very difficult but once the workup was done she was found not to have any spinal stenosis, epidural abscess or hematoma or discitis. This was radiculopathy to be treated as an outpatient and she was given a follow-up appointment. Today she is feeling much better alert and oriented and has come to reevaluate her bilateral lower extremity lymphedema and ulceration 03/25/2017 -- she was admitted to the hospital on 03/16/2017 and discharged on 03/18/2017 with left leg  cellulitis and ulceration. She was started on vancomycin and Zosyn and x-ray showed no bony involvement. She was treated for a cellulitis with IV antibiotics changed to Rocephin and Flagyl and was discharged on oral Keflex and doxycycline to complete a 7 day course. Last hemoglobin A1c was 7.1 and her other ailments including hypertension got asthma were appropriately treated. 05/06/2017 -- she is awaiting the right size of compression stockings from [place] but other than that has been doing well. ====== Old notes 69 year old patient was seen one time last October and was lost to follow-up. She has recurrent problems with weeping and ulceration of her left lower extremity and has swelling of this for several years. It has been worse for the last 2 months. Past medical history is significant for diabetes mellitus type 2, hypertension, gout, morbid obesity, depressive disorders, hiatal hernia, migraines, status post knee surgery, risk of a cholecystectomy, vaginal hysterectomy and breast biopsy. She is not a smoker. As noted before she has never had a venous duplex study and an arterial ABI study was attempted but the left lower extremity was noncompressible 10/01/2016 -- had a lower extremity venous duplex reflux evaluation which showed no evidence of deep vein reflux in the right or left lower extremity, and no evidence of great saphenous vein reflux more than 500 ms in the right or left lower extremity, and the left small saphenous vein is incompetent but no vascular consult was recommended. review of her electronic medical records noted that the ABI was checked in July 2017 where the right ABI was normal limits and the left ABI could not be ascertained due to pain with cuff pressure but the waveforms are within normal limits. her arterial duplex study scheduled for April 27. 10/08/2016 -- the patient has various reasons for not having a compression on and for the last 3 days she has  had no compression on her left lower extremity either due to pain or the lack of nursing help. She does not use her juxta lites either. 10/15/2016 -- the patient did not keep her appointment for arterial duplex study on April 27 and I have asked her  to reschedule this. Her pain is out of proportion with the physical findings and she continuously fails to wear a compression wraps and cuts them off because she says she cannot tolerate the pain. She does not use her juxta lites either. 10/22/2016 -- he has rescheduled her arterial duplex study to May 21 and her pain today is a bit better. She has not been wearing her juxta lites on her right lower extremity but now understands that she needs to do this. She did tolerate the to press compression wrap on her left lower extremity 10/29/2016 --arterial duplex study is scheduled for next week and overall she has been tolerating her compression wraps and also using her juxta lites on her right lower extremity 11/05/2016 -- the right ABI was 0.95 the left was 1.03. The digit TBI is on the right was 0.83 on the left was 0.92 and she had biphasic flow through these vessels. The impression was that of normal lower extremity arterial study. 11/12/2016 -- her pain is minimal and she is doing very well overall. 11/26/2016 -- she has got juxta lites and her insurance will not pay for additional dual layer compression stockings. She is going to order some from [place]. 05/12/2017 -- her juxta lites are very old and too big for her and these have not been helping with compression. She did get 20-30 mm compression stockings from [place] but she and her husband are unable to put these on. I believe she will benefit from bilateral Extremit-ease, compression stockings and we will measure her for these today. 05/20/2017 -- lymphedema on the left lower extremity has increased a lot and she has a open ulceration as a result of this. The right lower extremity is  looking pretty good. She has decided to by the compression stockings herself and will get reimbursed by the home health, at a later date. 05/27/2017 -- her sciatica is bothering her a lot and she thought her left leg pain was caused due to the compression wrap and hence removed it and has significant lymphedema. There is no inflammation on this left lower extremity. 06/17/17 on evaluation today patient appears to be doing very well and in fact is completely healed in regard to her ulcerations. Unfortunately however she does have continued issues with lymphedema nonetheless. We did order compression garments for her unfortunately she states that the size that she received were large although we ordered medium. Obviously this means she is not getting the optimal compression. She does not have those with her today and therefore we could not confirm and contact the company on her behalf. Nonetheless she does state that she is going to have her husband bring them by tomorrow so that we can verify and then get in touch with the company. No fevers, chills, nausea, or vomiting noted at this time. Overall patient is doing better otherwise and I'm pleased with the progress she has made. 07/01/17 on evaluation today patient appears to be doing very well in regard to her bilateral lower extremity she does not have any openings at this point which is excellent news. Overall I'm pleased with how things have progressed up to this time. Since she is doing so well we did order her compression which we are seeing her today to ensure that it fits her properly and everything is doing well in that regard and then subsequently she will be discharged. ============ Old Notes: 03/31/16 patient presents today for evaluation concerning open wounds that she has over the left  medial ankle region as well as the left dorsal foot. She has previously had this occur although it has been healed for a number of months after having  this for about a year prior until her hospitalization on 01/05/16. At that point in time it appears that she was admitted to the hospital for left lower extremity cellulitis and was placed on vancomycin and Zosyn at that point. Eventually upon discharge on January 15, 2016 she was placed on doxycycline at that point in time. Later on 03/27/16 positive wound culture growing Escherichia coli this was switched to amoxicillin. Currently she tells me that she is having pain radiated to be a 7 out of 10 which can be as high as 10 out of 10 with palpation and manipulation of the wound. This wound appears to be mainly venous in nature due to the bilateral lower extremity venous stasis/lymphedema. This is definitely much worse on her left than the right side. She does have type 1 diabetes mellitus, hypertension, morbid obesity, and is wheelchair dependent.during the course of the hospital stay a blood culture was also obtained and fortunately appeared negative. She also had an x-ray of the tibia/fibula on the left which showed no acute bone abnormality. Her white blood cell count which was performed last on 03/25/16 was 7.3, hemoglobin 12.8, protein 7.1, albumin 3.0. Her urine culture appeared to be negative for any specific organisms. Patient did have a left lower extremity venous duplex evaluation for DVT. This did not include venous reflux studies but fortunately was negative for DVT. Patient also had arterial studies performed which revealed that she had a normal ABI on the right though this was unable to be performed on the left secondary to pain that she was having around the ankle region due to the wound. However it was stated on report that she had biphasic pulses and apparently good blood flow. ========== 06/03/17 she is here in follow-up evaluation for right lower extremity ulcer. The right lower sure he has healed but she has reopened to the left medial malleolus and dorsal foot with weeping. She is  waiting for new compression garments to arrive from home health, the previous compression garments were ill fitting. We will continue with compression bilaterally and follow-up in 2 weeks Readmission: 08/05/17 on evaluation today patient appears to be doing somewhat poorly in regard to her left lower extremity especially although the right lower extremity has a small area which may no longer be open. She has been having a lot of drainage from the left lower extremity however he tells me that she has not been able to use the EXTREMIT- EASE Compression at this point. She states that she did better and was able to actually apply the Juxta-Lite compression although the wound that she has is too large and therefore really does not compress which is why she cannot wear it at this point. She has no one who can help her put it on regular basis her son can sometimes but he's not able to do it most of the time. I do believe that's why she has begun to weave and have issues as she is currently yet again. No fevers, chills, nausea, or vomiting noted at this time. Patient is no evidence of dementia. 08/12/17 on evaluation today patient appears to still be doing fairly well in regard to the draining areas/weeping areas at this point. With that being said she unfortunately did go to the ER yesterday due to what was felt to be possibly  a cellulitis. They place her on doxycycline by mouth and discharge her home. She definitely was not admitted. With that being said she states she has had more discomfort which has been unusual for her even compared to prior times and she's had infections.08/12/17 on evaluation today patient appears to still be doing fairly well in regard to the draining areas/weeping areas at this point. With that being said she unfortunately did go to the ER yesterday due to what was felt to be possibly a cellulitis. They place her on doxycycline by mouth and discharge her home. She definitely was  not admitted. With that being said she states she has had more discomfort which has been unusual for her even compared to prior times and she's had infections. 08/19/17 put evaluation today patient tells me that she's been having a lot of what sounds to be neuropathic type pain in regard to her left lower extremity. She has been using over-the-counter topical bins again which some believe. That in order to apply the she actually remove the wrap we put on her last Wednesday on Thursday. Subsequently she has not had anything on compression wise since that time. The good news is a lot of the weeping areas appear to have closed at this point again I believe she would do better with compression but we are struggling to get her to actually use what she needs to at this point. No fevers, chills, nausea, or vomiting noted at this time. 09/03/17 on evaluation today patient appears to be doing okay in regard to her lower extremities in regard to the lymphedema and weeping. Fortunately she does not seem to show any signs of infection at this point she does have a little bit of weeping occurring in the right medial malleolus area. With that being said this does not appear to be too significant which is good news. 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 She has severe skin damage secondary to both of these features involving the dorsal left foot and medial left ankle and lower leg. Still has open areas in the left anterior foot. The area on the right closed over. She uses her own juxta light stockings. She does not have an arterial issue 09/16/17 on evaluation today patient actually appears to be doing excellent in regard to her bilateral lower extremity swelling. The Juxta-Lite compression wrap seem to be doing very well for her. She has not however been using the portion that goes over her foot. Her left foot still is draining a little bit  not nearly as significant as it has been in the past but still I do believe that she likely needs to utilize the full wrap including the foot portion of this will improve as well. She also has been apparently putting on a significant amount of Vaseline which also think is not helpful for her. I recommended that if she feels she needs something for moisturizer Eucerin will probably be better. 09/30/17 on evaluation today patient presents with several new open areas in regard to her left lower extremity although these appear to be minimal and mainly seem to be more moisture breakdown than anything. Fortunately she does not seem to have any evidence of infection which is great news. She has been tolerating the dressing changes without complication we are using silver alginate on the foot she has been using AB pads to have the legs and using her Juxta-Lite compression which seems to be controlling  her swelling very well. Overall I'm pleased with the poor way she has progressed. 10/14/17 on evaluation today patient appears to be doing better in regard to her left lower extremity areas of weeping. She does still have some discomfort although in general this does not appear to be as macerated and I think it is progressing nicely. I do think she still needs to wear the foot portion of her Juxta-Lite in order to get the most benefit from the wrap obviously. She states she understands. Fortunately there does not appear to be evidence of infection at this time which is great news. 10/28/17 on evaluation today patient appears to be doing excellent in regard to her left lower extremity. She has just a couple areas that are still open and seem to be causing any trouble whatsoever. For that reason I think that she is definitely headed in the right direction the spots are very tiny compared to what we have been dealing with in the past. 11/11/17 on evaluation today patient appears to have a right lateral lower  extremity ulcer that has opened since I last saw her. She states this is where the home health nurse that was coming out remove the dressing without wetting the alginate first. Nonetheless I do not know if this is indeed the case or not but more importantly we have not ordered home help to be coming out for her wounds at all. I'm unsure as to why they are coming out and we're gonna have to check on this and get things situated in that regard. With that being said we currently really do not need them to be coming out as the patient has been taking care of her leg herself without complication and no issues. In fact she was doing much better prior to nursing coming out. 11/25/17 on evaluation today patient actually appears to be doing fairly well in regard to her left lower extremity swelling. In fact she has very little area of weeping at this point there's just a small spot on the lateral portion of her right leg that still has me just a little bit more concerned as far as wanting to see this clear up before I discharge her to caring for this at home. Nonetheless overall she has made excellent progress. 12/09/17 on evaluation today patient appears to be doing rather well in regard to her lower extremity edema. She does have some weeping still in the left lower extremity although the big area we were taking care of two weeks ago actually has closed and she has another area of weeping on the left lower extremity immediately as well is the top of her foot. She does not currently have lymphedema pumps she has been wearing her compression daily on a regular basis as directed. With that being said I think she may benefit from lymphedema pumps. 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 We had a very difficult time getting and keeping this under  control. 12/23/17 on evaluation today patient actually appears to be doing a little bit more poorly in regard to her bilateral lower extremities. She has been tolerating the Juxta-Lite compression wraps. Unfortunately she has two new ulcers on the right lower extremity and left lower Trinity ulceration seems to be larger. Obviously this is not good news. She has been tolerating the dressings without complication.  12/30/17 on evaluation today patient actually appears to be doing much better in regard to her bilateral lower extremity edema. She continues to have some issues with ulcerations and in fact there appears to be one spot on each leg where the wrap may have caused a little bit of a blister which is subsequently opened up at this point is given her pain. Fortunately it does not appear to be any evidence of infection which is good news. No fevers chills noted. 01/13/18 on evaluation today patient appears to be doing rather well in regard to her bilateral lower extremities. The dressings did get kind of stuck as far as the wound beds are concerned but again I think this is mainly due to the fact that she actually seems to be showing signs of healing which is good news. She's not having as much drainage therefore she was having more of the dressing sticking. Nonetheless overall I feel like her swelling is dramatically down compared to previous. 01/20/18 on evaluation today patient unfortunately though she's doing better in most regards has a large blister on the left anterior lower extremity where she is draining quite significantly. Subsequently this is going to need debridement today in order to see what's underneath and ensure she does not continue to trapping fluid at this location. Nonetheless No fevers, chills, nausea, or vomiting noted at this time. 01/27/18 on evaluation today patient appears to be doing rather well at this point in regard to her right lower extremity there's just a very  small area that she still has open at this point. With that being said I do believe that she is tolerating the compression wraps very well in making good progress. Home health is coming out at this point to see her. Her left lower extremity on the lateral portion is actually what still mainly open and causing her some discomfort for the most part 02/10/18 on evaluation today patient actually appears to be doing very well in regard to her right lower extremity were all the ulcers appear to be completely close. In regard to the left lower extremity she does have two areas still open and some leaking from the dorsal surface of her foot but this still seems to be doing much better to me in general. 02/24/18 on evaluation today patient actually appears to be doing much better in regard to her right lower extremity this is still completely healed. Her left lower extremity is also doing much better fortunately she has no evidence of infection. The one area that is gonna require some debridement is still on the left anterior shin. Fortunately this is not hurting her as badly today. 03/10/18 on evaluation today patient appears to be doing better in some regards although she has a little bit more open area on the dorsal foot and she also has some issues on the medial portion of the left lower extremity which is actually new and somewhat deep. With that being said there fortunately does not appear to be any significant signs of infection which is good news. No fevers, chills, nausea, or vomiting noted at this time. In general her swelling seems to be doing fairly well which is good news. 03/31/18 on evaluation today patient presents for follow-up concerning her left lower extremity lymphedema. Unfortunately she has been doing a little bit more poorly since I last saw her in regard to the amount of weeping that she is experiencing. She's also having some increased pain in the anterior shin location. Unfortunately  I do not feel like the patient is making such good progress at this point a few weeks back she was definitely doing much better. 04/07/18 on evaluation today patient actually appears to be showing some signs of improvement as far as the left lower extremity is concerned. She has been tolerating the dressing changes and it does appear that the Drawtex did better for her. With that being said unfortunately home health is stating that they cannot obtain the Drawtex going forward. Nonetheless we're gonna have to check and see what they may be able to get the alginate they were using was getting stuck in causing new areas of skin being pulled all that with and subsequently weep and calls her to worsen overall this is the first time we've seen improvement at this time. 04/14/18 on evaluation today patient actually appears to be doing rather well at this point there does not appear to be any evidence of infection at this time and she is actually doing excellent in regard to the weeping in fact she almost has no openings remaining even compared to just last week this is a dramatic improvement. No fevers chills noted 04/21/18 evaluation today patient actually appears to be doing very well. She in fact is has a small area on the posterior lower extremity location and she has a small area on the dorsal surface of her foot that are still open both of which are very close to closing. We're hoping this will be close shortly. She brought her Juxta-Lite wrap with her today hoping that would be able to put her in it unfortunately I don't think were quite at that point yet but we're getting closer. 04/28/18 upon evaluation today patient actually appears to be doing excellent in regard to her left lower extremity ulcer. In fact the region on the posterior lower extremity actually is much smaller than previously noted. Overall I'm very happy with the progress she has made. She again did bring her Juxta-Lite although  we're not quite ready for that yet. 05/11/18 upon evaluation today patient actually appears to be doing in general fairly well in regard to her left lower Trinity. The swelling is very well controlled. With that being said she has a new area on the left anterior lower extremity as well as between the first and second toes of her left foot that was not present during the last evaluation. The region of her posterior left lower extremity actually appears to be almost completely healed. To be honest I'm very pleased with the way that stands. Nonetheless I do believe that the lotion may be keeping the area to moist as far as her legs are concerned subsequently I'm gonna consider discontinuing that today. 05/26/18 on evaluation today patient appears to be doing rather well in regard to her left lower should be ulcers. In fact everything appears to be close except for a very small area on the left posterior lower extremity. Fortunately there does not appear to be any evidence of infection at this time. Overall very pleased with her progress. 06/02/18 and evaluation today patient actually appears to be doing very well in regard to her lower extremity ulcers. She has one small area that still continues to weep that I think may benefit her being able to justify lotion and user Juxta-Lite wraps versus continued to wrap her. Nonetheless I think this is something we can definitely look into at this point. 06/23/18 on evaluation today patient unfortunately has openings of her bilateral lower extremities.  In general she seems to be doing much worse than when I last saw her just as far as her overall health standpoint is concerned. She states that her discomfort is mainly due to neuropathy she's not having any other issues otherwise. No fevers, chills, nausea, or vomiting noted at this time. 06/30/18 on evaluation today patient actually appears to be doing a little worse in regard to her right lower extremity her  left lower extremity of doing fairly well. Fortunately there is no sign of infection at this time. She has been tolerating the dressing changes without complication. Home health did not come out like they were supposed to for the appropriate wrap changes. They stated that they never received the orders from [place] which were fax. Nonetheless we will send a copy of the orders with the patient today as well. 07/07/18 on evaluation today patient appears to be doing much better in regard to lower extremities. She still has several openings bilaterally although since I last saw her her legs did show obvious signs of infection when she later saw her nurse. Subsequently a culture was obtained and she is been placed on Bactrim and Keflex. Fortunately things seem to be looking much better it does appear she likely had an infection. Again last week we'd even discussed it but again there really was not any obvious sign that she had infection therefore we held off on the antibiotics. Nonetheless I'm glad she's doing better today. 07/14/18 on evaluation today patient appears to be doing much better regarding her bilateral lower Trinity's. In fact on the right lower for me there's nothing open at this point there are some dry skin areas at the sites where she had infection. Fortunately there is no evidence of systemic infection which is excellent news. No fevers chills noted 07/21/18 on evaluation today patient actually appears to be doing much better in regard to her left lower extremity ulcers. She is making good progress and overall I feel like she's improving each time I see her. She's having no pain I do feel like the infection is completely resolved which is excellent news. No fevers, chills, nausea, or vomiting noted at this time. 07/28/18 on evaluation today patient appears to be doing very well in regard to her left lower [employer]. Everything seems to be showing signs of improvement which is excellent  news. Overall very pleased with the progress that has been made. Fortunately there's no evidence of active infection at this time also excellent news. 08/04/18 on evaluation today patient appears to be doing more poorly in regard to her bilateral lower extremities. She has two new areas open up on the right and these were completely closed as of last week. She still has the two spots on the left which in my pinion seem to be doing better. Fortunately there's no evidence of infection again at this point. 08/11/18 on evaluation today patient actually appears to be doing very well in regard to her bilateral lower Trinity wounds that all seem to be doing better and are measures smaller today. Fortunately there's no signs of infection. No fevers, chills, nausea, or vomiting noted at this time. 08/18/18 on evaluation today patient actually appears to be doing about the same inverter bilateral lower extremities. She continues to have areas that blistering open as was drain that fortunately nothing too significant. Overall I feel like Drawtex may have done better for her however compared to the collagen. 08/25/18 on evaluation today patient appears to be doing a  little bit more poorly today even compared to last time I saw her. Again I'm not exactly sure why she's making worse progress over the past several weeks. I'm beginning to wonder if there is some kind of underlying low level infection causing this issue. I did actually take a culture from the left anterior lower extremity but it was a new wound draining quite a bit at this point. Unfortunately she also seems to be having more pain which is what also makes me worried about the possibility of infection. This is despite never erythema noted at this point. 09/01/18 on evaluation today patient actually appears to be doing a little worse even compared to last week in regard to bilateral lower extremities. She did go to the hospital on the 16th was given a  dose of IV Zosyn and then discharged with a recommendation to continue with the Bactrim that I previously prescribed for her. Nonetheless she is still having a lot of discomfort she tells me as well at this time. This is definitely unfortunate. No fevers, chills, nausea, or vomiting noted at this time. 09/08/18 on evaluation today patient's bilateral lower extremities actually appear to be shown signs of improvement which is good news. Fortunately there does not appear to be any signs of active infection I think the anabiotic is helping in this regard. Overall I'm very pleased with how she is progressing. 09/15/18 patient was actually seen in ER yesterday due to her legs as well unfortunately. She states she's been having a lot of pain and discomfort as well as a lot of drainage. Upon inspection today the patient does have a lot of swelling and drainage I feel like this is more related to lymphedema and poor fluid control than it is to infection based on what I'm seeing. The physician in the emergency department also doubted that the patient was having a significant infection nonetheless I see no evidence of infection obvious at this point although I do see evidence of poor fluid control. She still not using a compression pumps, she is not elevating due to her lift chair as well as her hospital bed being broken, and she really is not keeping her legs up as much as they should be and also has been taking off her wraps. All this combined I think has led to poor fluid control and to be honest she may be somewhat volume overloaded in general as well. I recommend that she may need to contact your physician to see if a prescription for a diuretic would be beneficial in their opinion. As long as this is safe I think it would likely help her. 09/29/18 on evaluation today patient's left lower extremity actually appears to be doing quite a bit better. At least compared to last time that I saw her. She still has  a large area where she is draining from but there's a lot of new skin speckled trout and in fact there's more new skin that there are open areas of weeping and drainage at this point. This is good news. With regard to the right lower extremity this is doing much better with the only open area that I really see being a dry spot on the right lateral ankle currently. Fortunately there's no signs of active infection at this time which is good news. No fevers, chills, nausea, or vomiting noted at this time. The patient seems somewhat stressed and overwhelmed during the visit today she was very lethargic as such. She does and she  is not taking any pain medications at this point. Apparently according to her husband are also in the process of moving which is probably taking its toll on her as well. 10/06/18 on evaluation today patient appears to be doing rather well in regard to her lower extremities compared to last evaluation. Fortunately there's no signs of active infection. She tells me she did have an appointment with her primary. Nonetheless he was concerned that the wounds were somewhat deep based on pictures but we never actually saw her legs. She states that he had her somewhat worried due to the fact that she was fearing now that she was [place] have to have an amputation. With that being said based on what I'm seeing check she looks better this week that she has the last two times I've seen her with much less drainage I'm actually pleased in this regard. That doesn't mean that she's out of the water but again I do not think what the point of talking about education at all in regard to her leg. She is very happy to hear this. She is also not having as much pain as she was having last week. 10/13/18 unfortunately on evaluation today patient still continues to have a significant amount of drainage she's not letting home health actually apply the compression dressings at this point. She's trying to use  of Juxta-Lite of the top of Kerlex and the second layer of the three layer compression wrap. With that being said she just does not seem to be making as good a progress as I would expect if she was having the compression applied and in place on a regular basis. No fevers, chills, nausea, or vomiting noted at this time. 10/20/18 on evaluation today patient appears to be doing a little better in regard to her bilateral lower extremity ulcers. In fact the right lower extremity seems to be healed she doesn't even have any openings at this point left lower extremity though still somewhat macerated seems to be showing signs of new skin growth at multiple locations throughout. Fortunately there's no evidence of active infection at this time. No fevers, chills, nausea, or vomiting noted at this time. 10/27/18 on evaluation today patient appears to be doing much better in regard to her left lower Trinity ulcer. She's been tolerating the laptop complication and has minimal drainage noted at this point. Fortunately there's no signs of active infection at this time. No fevers, chills, nausea, or vomiting noted at this time. 11/03/18 on evaluation today patient actually appears to be doing excellent in regard to her left lower extremity. She is having very little drainage at this point there does not appear to be any significant signs of infection overall very pleased with how things have gone. She is likewise extremely pleased still and seems to be making wonderful progress week to week. I do believe antibiotics were helpful for her. Her primary care provider did place on amateur clean since I last saw her. 11/17/18 on evaluation today patient appears to be doing worse in regard to her bilateral lower extremities at this point. She is been tolerating the dressing changes without complication. With that being said she typically takes the Coban off fairly quickly upon arriving home even after being seen here in the  clinic and does not allow home health reapply command as part of the dressing at home. Therefore she said no compression essentially since I last saw her as best I can tell. With that being said I  think it shows and how much swelling she has in the open wounds that are noted at this point. Fortunately there's no signs of infection but unfortunately if she doesn't get this under control I think she will end up with infection and more significant issues. 11/24/18 on evaluation today patient actually appears to be doing somewhat better in regard to her bilateral lower extremities. 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 Nonetheless that is doing better currently so she should be able to attempt reinitiating the lymphedema pumps at this time. No fevers, chills, nausea, or vomiting noted at this time. 12/01/18 upon evaluation today patient's left lower extremity appears to be doing quite well unfortunately her right lower extremity is not doing nearly as well. She has been tolerating the dressing changes without complication unfortunately she did not keep a wrap on the right at this time. Nonetheless I believe this has led to increased swelling and weeping in the world is actually much larger than during the last evaluation with her. 12/08/18 on evaluation today patient appears to be doing about the same at this point in regard to her right lower extremity. There is some more palatable to touch I'm concerned about the possibility of there being some infection although I think the main issue is she's not keeping her compression wrap on which in turn is not allowing this area to heal appropriately. 12/22/18 on evaluation today patient appears to be doing better in regard to left lower extremity unfortunately significantly worse in regard to the right lower  extremity. The areas of blistering and necrotic superficial tissue have spread and again this does not really appear to be signs of infection and all she just doesn't seem to be doing nearly as well is what she has been in the past. Overall I feel like the Augmentin did absolutely nothing for her she doesn't seem to have any infection again I really didn't think so last time either is more of a potential preventative measure and hoping that this would make some difference but I think the main issue is she's not wearing her compression. She tells me she cannot wear the [place] been we put on she takes it off pretty much upon getting home. Subsequently she worshiped Juxta-Lite when I questioned her about how often she wears it this is no more than three hours a day obviously that leaves 21 hours that she has no compression and this is obviously not doing well for her. Overall I'm concerned that if things continue to worsen she is at great risk of both infection as well as losing her leg. 01/05/19 on evaluation today patient appears to be doing well in regard to her left lower extremity which he is allowing [place] to wrap and not so well with regard to her right lower extremity which she is not allowing [place] to really wrap and keep the wrap on. She states that it hurts too badly whenever it's wrapped and she ends up having to take it off. She's been using the Juxta-Lite she tells me up to six hours a day although I question whether or not that's really been the case to be honest. Previously she told me three hours today nonetheless obviously the legs as long as the wrap is doing great when she is not is doing  much more poorly. 01/12/2019 on evaluation today patient actually appears to be doing a little better in my opinion with regard to her right lower extremity ulcer. She has a small open area on the left lower extremity unfortunately but again this I think is part of the normal fluctuation of what she is  going to have to expect with regard to her legs especially when she is not using her lymphedema pumps on a regular basis. Subsequently based on what I am seeing today I think that she does seem to be doing slightly better with regard to her right lower extremity she did see her primary care provider on Monday they felt she had an infection and placed her on 2 antibiotics. Both Cipro and clindamycin. Subsequently again she seems possibly to be doing a little bit better in regards to the right lower extremity she also tells me however she has been wearing the compression wrap over the past week since I spoke with her as well that is a Kerlix and Coban wrap on the right. No fevers, chills, nausea, vomiting, or diarrhea. 01/19/2019 on evaluation today patient appears to be doing better with regard to her bilateral lower extremities especially the right. I feel like the compression has been beneficial for her which is great news. She did get a call from her primary care provider on her way here today telling her that she did have methicillin-resistant Staphylococcus aureus and he was calling in a couple new antibiotics for her including a ointment to be applied she tells me 3 times a day. With that being said this sounds like likely to be Bactroban which I think could be applied with each dressing/wrap change but I would not be able to accommodate her applying this 3 times a day. She is in agreement with the least doing this we will add that to her orders today. 01/26/2019 on evaluation today patient actually appears to be doing much better with regard to her right lower extremity. Her left lower extremity is also doing quite well all things considering. Fortunately there is no evidence of active infection at this time. No fevers, chills, nausea, vomiting, or diarrhea. 02/02/2019 on evaluation today patient appears to be doing much better compared to her last evaluation. Little by little off like her right  leg is returning more towards normal. There does not appear to be any signs of active infection and overall she seems to be doing quite well which is great news. I am very pleased in this regard. No fevers, chills, nausea, vomiting, or diarrhea. 02/09/2019 upon evaluation today patient appears to be doing better with regard to her bilateral lower extremities. She has been tolerating the dressing changes without complication. Fortunately there is no signs of active infection at this time. No fevers, chills, nausea, vomiting, or diarrhea. 02/23/2019 on evaluation today patient actually appears to be doing quite well with regard to her bilateral lower extremities. She has been tolerating the dressing changes without complication. She is even used her pumps one time and states that she really felt like it felt good. With that being said she seems to be in good spirits and her legs appear to be doing excellent. 03/09/2019 on evaluation today patient appears to be doing well with regard to her right lower extremity there are no open wounds at this time she is having some discomfort but I feel like this is more neuropathy than anything. With regard to her left lower extremity she had several areas  scattered around that she does have some weeping and drainage from but again overall she does not appear to be having any significant issues and no evidence of infection at this time which is good news. 03/23/2019 on evaluation today patient appears to be doing well with regard to her right lower extremity which she tells me is still close she is using her juxta light here. Her left lower extremity she mainly just has an area on the foot which is still slightly draining although this also is doing great. Overall very pleased at this time. 04/06/2019 patient appears to be doing a little bit worse in regard to her left lower extremity upon evaluation today. She feels like this could be becoming infected again which  she had issues with previous. Fortunately there is no signs of systemic infection but again this is always a struggle with her with her legs she will go from doing well to not so well in a very short amount of time. 04/20/2019 on evaluation today patient actually appears to be doing quite well with regard to her right lower extremity I do not see any signs of active infection at this time. Fortunately there is no fever chills noted. She is still taking the antibiotics which I prescribed for her at this point. In regard to the left lower extremity I do feel like some of these areas are better although again she still is having weeping from several locations at this time. 04/27/2019 on evaluation today patient appears to be doing about the same if not slightly worse in regard to her left lower extremity ulcers. She tells me when questioned that she has been sleeping in her Hoveround chair in fact she tells me she falls asleep without even knowing it. I think she is spending a whole lot of time in the chair and less time walking and moving around which is not good for her legs either. On top of that she is in a seated position which is also the worst position she is not really elevating her legs and she is also not using her lymphedema pumps. All this is good to contribute to worsening of her condition in general. 05/18/2019 on evaluation today patient appears to be doing well with regard to her lower extremity on the right in fact this is showing no signs of any open wounds at this time. On the left she is continuing to have issues with areas that do drain. Some of the regions have healed and there are couple areas that have reopened. She did go to the ER per the patient according to recommendations from the home health nurse due to what she was seen when she came out on 05/13/2019. Subsequently she felt like the patient needed to go to the hospital due to the fact that again she was having "milky  white discharge" from her leg. Nonetheless she had and then was placed on doxycycline and subsequently seems to be doing better. 06/01/2019 upon evaluation today patient appears to be doing really in my opinion about the same. I do not see any signs of active infection which is good news. Overall she still has wounds over the bilateral lower extremities she has reopened on the right but this appears to be more of a crack where there is weeping/edema coming from the region. I do not see any evidence of infection at either site based on what I visualized today. 07/13/2019 upon evaluation today patient appears to be doing a little worse  compared to last time I saw her. She since has been in the hospital from 06/21/2019 through 06/29/2019. This was secondary to having Covid. During that time they did apply lotion to her legs which unfortunately has caused her to develop a myriad of open wounds on her lower extremities. Her legs do appear to be doing better as far as the overall appearance is concerned but nonetheless she does have more open and weeping areas. 07/27/2019 upon evaluation today patient appears to be doing more poorly to be honest in regard to her left lower extremity in particular. There is no signs of systemic infection although I do believe she may have local infection. She notes she has been having a lot of blue/green drainage which is consistent potentially with Pseudomonas. That may be something that we need to consider here as well. The doxycycline does not seem to have been helping. 08/03/2019 upon evaluation today patient appears to be doing a little better in my opinion compared to last week's evaluation. Her culture I did review today and she is on appropriate medications to help treat the Enterobacter that was noted. Overall I feel like that is good news. With that being said she is unfortunately continuing to have a lot of drainage and though it is doing better I still think she  has a long ways to go to get things dried up in general. Fortunately there is no signs of systemic infection. 08/10/2019 upon evaluation today patient appears to be doing may be slightly better in regard to her left lower extremity the right lower extremity is doing much better. Fortunately there is no signs of infection right now which is good news. No fevers, chills, nausea, vomiting, or diarrhea. 08/24/2019 on evaluation today patient appears to be doing slightly better in regard to her lower extremities. The left lower extremity seems to be healed the right lower extremity is doing better though not completely healed as far as the openings are concerned. She has some generalized issues here with edema and weeping secondary to her lymphedema though again I do believe this is little bit drier compared to prior weeks evaluations. In general I am very pleased with how things seem to be progressing. No fevers, chills, nausea, vomiting, or diarrhea. 08/31/2019 upon evaluation today patient actually seems to making some progress here with regard to the left lower extremity in particular. She has been tolerating the dressing changes without complication. Fortunately there is no signs of active infection at this time. No fevers, chills, nausea, vomiting, or diarrhea. She did see Dr. Doren Custard and he did note that she did have a issue with the left great saphenous vein and the small saphenous vein in the leg. With that being said he was concerned about the possibility of laser ablation not being extremely successful. He also mentioned a small risk of DVT associated with the procedure. However if the wounds do not continue to improve he stated that that would probably be the way to go. Fortunately the patient's legs do seem to be doing much better. 09/07/2019 upon evaluation today patient appears to be doing better with regard to her lower extremities. She has been tolerating the dressing changes without  complication. With that being said she is showing signs of improvement and overall very pleased. There are some areas on her leg that I think we do need to debride we discussed this last week the patient is in agreement with doing that as long as it does not hurt too  badly. 09/14/2019 upon evaluation today patient appears to be doing decently well with regard to her left lower extremity. She is not having near as much weeping as she has had in the past things seem to be drying up which is good news. There is no signs of active infection at this time. 09/21/19 upon evaluation today patient appears to be doing better in regard overall to her bilateral lower extremities. She again has less open than she did previous and each week I feel like this is getting better. Fortunately there is no signs of active infection at this time. No fevers, chills, nausea, vomiting, or diarrhea. Objective Constitutional Obese and well-hydrated in no acute distress. Vitals Time Taken: 10:25 AM, Height: 62 in, Weight: 335 lbs, BMI: 61.3, Temperature: 98.1 F, Pulse: 97 bpm, Respiratory Rate: 18 breaths/min, Blood Pressure: 143/76 mmHg. Respiratory normal breathing without difficulty. Psychiatric this patient is able to make decisions and demonstrates good insight into disease process. Alert and Oriented x 3. pleasant and cooperative. General Notes: Patient's wounds currently show signs of improvement she does have some slough noted I was able to mechanically debride some of this away. With that being said overall I feel like she has less open on the left lower extremity and I did not see anything actually open on the right lower extremity either. Her left foot also is doing significantly better. Integumentary (Hair, Skin) Wound #61 status is Open. Original cause of wound was Gradually Appeared. The wound is located on the Left,Circumferential Lower Leg. The wound measures 16.5cm length x 24cm width x 0.1cm depth;  311.018cm^2 area and 31.102cm^3 volume. There is Fat Layer (Subcutaneous Tissue) Exposed exposed. There is no tunneling or undermining noted. There is a large amount of serous drainage noted. The wound margin is flat and intact. There is small (1-33%) pink, pale granulation within the wound bed. There is a large (67-100%) amount of necrotic tissue within the wound bed including Adherent Slough. Wound #64 status is Open. Original cause of wound was Gradually Appeared. The wound is located on the Left,Dorsal Foot. The wound measures 6.5cm length x 6.5cm width x 0.1cm depth; 33.183cm^2 area and 3.318cm^3 volume. There is Fat Layer (Subcutaneous Tissue) Exposed exposed. There is no tunneling or undermining noted. There is a small amount of serous drainage noted. The wound margin is flat and intact. There is small (1-33%) pink, pale granulation within the wound bed. There is a large (67-100%) amount of necrotic tissue within the wound bed including Adherent Slough. 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 There was a Three Layer Compression Therapy Procedure by Carlene Coria, RN. Post procedure Diagnosis Wound #61: Same as Pre-Procedure Wound  #64 Pre-procedure diagnosis of Wound #64 is a Diabetic Wound/Ulcer of the Lower Extremity located on the Left,Dorsal Foot . There was a Three Layer Compression Therapy Procedure by Carlene Coria, RN.  Post procedure Diagnosis Wound #64: Same as Pre-Procedure Plan Follow-up Appointments: Return Appointment in 1 week. Dressing Change Frequency: Wound #61 Left,Circumferential Lower Leg: Change dressing three times week. Wound #64 Left,Dorsal Foot: Change dressing three times week. Skin Barriers/Peri-Wound Care: Barrier cream - zinc oxide cream to any macerated areas Moisturizing lotion - to right leg daily Wound Cleansing: Clean wound with Wound Cleanser - all wounds May shower with protection. Primary Wound Dressing: Wound #61 Left,Circumferential Lower Leg: Calcium Alginate with Silver Wound #64 Left,Dorsal Foot: Calcium Alginate with Silver Secondary Dressing: Wound #61 Left,Circumferential Lower Leg: Dry Gauze - all wounds ABD pad - as needed Zetuvit or Kerramax - as needed Wound #64 Left,Dorsal Foot: Dry Gauze - all wounds ABD pad - as needed Edema Control: 3 Layer Compression System - Left Lower Extremity Avoid standing for long periods of time - walking is encouraged Elevate legs to the level of the heart or above for 30 minutes daily and/or when sitting, a frequency of: - do not sleep in chair with feet dangling, MUST elevate legs while sitting Exercise regularly Support Garment 20-30 mm/Hg pressure to: - Juxtalite to right lower leg daily Segmental Compressive Device. - lymphedema pumps 60 minutes 1- 2 times per day Off-Loading: Turn and reposition every 2 hours Additional Orders / Instructions: Follow Nutritious Diet - To include vitamin A, vitamin C, and Zinc along with increased protein intake. Home Health: [facility]. - [facility]. My suggestion at this time is good to be that we go ahead and continue with the current  wound care measures utilizing the zinc oxide followed by the silver alginate. 2. I am also can recommend at this point that we go ahead and have the patient continue with elevation she really needs to be using her lymphedema pumps but I do not believe she is doing this at all. 3. I would also recommend she continue to try to be active although again I really do not know that she is walking or ambulating much at all. We will see patient back for reevaluation in 1 week here in the clinic. If anything worsens or changes patient will contact our office for additional recommendations. Electronic Signature(s) Signed: 09/21/2019 6:09:37 PM By: [name] Keeler PA-C Entered By: [name] on 09/21/2019 18:09:37 -------------------------------------------------------------------------------- SuperBill Details Patient Name: Date of Service: [name] 09/21/2019 Medical Record [id number] Patient Account Number: [id number] Date of Birth/Sex: Treating RN: January 03, 1951 (68 y.o. [name] Primary Care Provider: Dustin [name] Other Clinician: Referring Provider: Treating Provider/Extender:[name] Weeks in Treatment: 111 Diagnosis Coding ICD-10 Codes Code Description E11.622 Type 2 diabetes mellitus with other skin ulcer I89.0 Lymphedema, not elsewhere classified I87.331 Chronic venous hypertension (idiopathic) with ulcer and inflammation of right lower extremity I87.332 Chronic venous hypertension (idiopathic) with ulcer and inflammation of left lower extremity L97.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 leg with fat layer exposed L97.822 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 I10 Essential (primary) hypertension E66.01 Morbid (severe) obesity due to excess calories F41.8 Other specified anxiety disorders R53.1  Weakness Facility Procedures The patient participates with Medicare or their insurance follows the Medicare Facility Guidelines: CPT4 Code Description Modifier Quantity [id number] (Facility Use Only) [phone] - [place] [facility] Physician Procedures CPT4 Code Description: [id number] - [facility] PHYS  LEVEL 3 - EST PT ICD-10 Diagnosis Description E11.622 Type 2 diabetes mellitus with other skin ulcer I89.0 Lymphedema, not elsewhere classified I87.331 Chronic venous hypertension (idiopathic) with ulcer and  lower extremity I87.332 Chronic venous hypertension (idiopathic) with ulcer and extremity Modifier: inflammation of inflammation of Quantity: 1 right left lower Electronic Signature(s) Signed: 09/21/2019 6:09:53 PM By: [name] Keeler PA-C Previous Signature: 09/21/2019 6:07:56 PM Version By: [name] RN, BSN Entered By: [name] on 09/21/2019 18:09:50

## 2019-09-28 ENCOUNTER — Encounter (HOSPITAL_BASED_OUTPATIENT_CLINIC_OR_DEPARTMENT_OTHER): Payer: Medicare PPO | Admitting: Physician Assistant

## 2019-09-28 ENCOUNTER — Other Ambulatory Visit: Payer: Self-pay

## 2019-09-28 DIAGNOSIS — E10621 Type 1 diabetes mellitus with foot ulcer: Secondary | ICD-10-CM | POA: Diagnosis not present

## 2019-09-28 NOTE — Progress Notes (Addendum)
OTHEL, DICOSTANZO ([id number]) Visit Report for 09/28/2019 Chief Complaint Document Details Patient Name: Date of Service: [name], FELLING 09/28/2019 10:30 AM Medical Record [id number] Patient Account Number: [id number] Date of Birth/Sex: Treating RN: Mar 10, 1951 (69 y.o. [name] Primary Care Provider: [name] Other Clinician: Referring Provider: Treating Provider/Extender:Stone III, [name], FRED Weeks in Treatment: 112 Information Obtained from: Patient Chief Complaint Bilateral reoccurring LE ulcers Electronic Signature(s) Signed: 09/28/2019 10:26:20 AM By: [name] PA-C Entered By: [name] on 09/28/2019 10:26:20 -------------------------------------------------------------------------------- Debridement Details Patient Name: Date of Service: [name]. 09/28/2019 10:30 AM Medical Record [id number] Patient Account Number: [id number] Date of Birth/Sex: 05/20/1951 (68 y.o. F) Treating RN: [name] Primary Care Provider: [name] Other Clinician: Referring Provider: Treating Provider/Extender:Stone III, [name], FRED Weeks in Treatment: 112 Debridement Performed for Wound #61 Left,Circumferential Lower Leg Assessment: Performed By: Physician [name], PA Debridement Type: Debridement Severity of Tissue Pre Fat layer exposed Debridement: Level of Consciousness (Pre- Awake and Alert procedure): Pre-procedure Verification/Time Out Taken: Yes - 10:50 Start Time: 10:53 Pain Control: Other : benzocaine 20% spray Total Area Debrided (L x W): 5 (cm) x 7 (cm) = 35 (cm) Tissue and other material Viable, Non-Viable, Slough, Subcutaneous, Skin: Epidermis, Slough debrided: Level: Skin/Subcutaneous Tissue Debridement Description: Excisional Instrument: Curette Bleeding: Minimum Hemostasis Achieved: Pressure End Time: 11:07 Procedural Pain: 2 Post Procedural Pain: 1 Response to Treatment: Procedure was  tolerated well Level of Consciousness Awake and Alert (Post-procedure): Post Debridement Measurements of Total Wound Length: (cm) 16 Width: (cm) 22.5 Depth: (cm) 0.1 Volume: (cm) 28.274 Character of Wound/Ulcer Post Improved Debridement: Severity of Tissue Post Debridement: Fat layer exposed Post Procedure Diagnosis Same as Pre-procedure Electronic Signature(s) Signed: 09/28/2019 4:44:47 PM By: [name] PA-C Signed: 09/28/2019 5:43:24 PM By: [name] RN, BSN Entered By: [name] on 09/28/2019 11:04:32 -------------------------------------------------------------------------------- HPI Details Patient Name: Date of Service: [name]. 09/28/2019 10:30 AM Medical Record [id number] Patient Account Number: [id number] Date of Birth/Sex: Treating RN: 1951-06-16 (69 y.o. [name] Primary Care Provider: [name] Other Clinician: Referring Provider: Treating Provider/Extender:Stone III, [name], FRED Weeks in Treatment: 112 History of Present Illness HPI Description: this patient has been seen a couple of times before and returns with recurrent problems to her right and left lower extremity with swelling and weeping ulcerations due to not wearing her compression stockings which she had been advised to do during her last discharge, at the end of June 2018. During her last visit the patient had had normal arterial blood flow and her venous reflux study did not necessitate any surgical intervention. She was recommended compression and elevation and wound care. After prolonged treatment the patient was completely healed but she has been noncompliant with wearing or compressions.. She was here last week with an outpatient return visit planned but the patient came in a very poor general condition with altered mental status and was rushed to the ER on my request. With a history of hypertension, diabetes, TIA and right-sided weakness she was set  up for an MRI on her brain and cervical spine and was sent to [facility]. Getting an MRI done was very difficult but once the workup was done she was found not to have any spinal stenosis, epidural abscess or hematoma or discitis. This was radiculopathy to be treated as an outpatient and she was given a follow-up appointment. Today she is feeling much better alert and oriented and has come to reevaluate  her bilateral lower extremity lymphedema and ulceration 03/25/2017 -- she was admitted to the hospital on 03/16/2017 and discharged on 03/18/2017 with left leg cellulitis and ulceration. She was started on vancomycin and Zosyn and x-ray showed no bony involvement. She was treated for a cellulitis with IV antibiotics changed to Rocephin and Flagyl and was discharged on oral Keflex and doxycycline to complete a 7 day course. Last hemoglobin A1c was 7.1 and her other ailments including hypertension got asthma were appropriately treated. 05/06/2017 -- she is awaiting the right size of compression stockings from [place] but other than that has been doing well. ====== Old notes 69 year old patient was seen one time last October and was lost to follow-up. She has recurrent problems with weeping and ulceration of her left lower extremity and has swelling of this for several years. It has been worse for the last 2 months. Past medical history is significant for diabetes mellitus type 2, hypertension, gout, morbid obesity, depressive disorders, hiatal hernia, migraines, status post knee surgery, risk of a cholecystectomy, vaginal hysterectomy and breast biopsy. She is not a smoker. As noted before she has never had a venous duplex study and an arterial ABI study was attempted but the left lower extremity was noncompressible 10/01/2016 -- had a lower extremity venous duplex reflux evaluation which showed no evidence of deep vein reflux in the right or left lower extremity, and no evidence of great  saphenous vein reflux more than 500 ms in the right or left lower extremity, and the left small saphenous vein is incompetent but no vascular consult was recommended. review of her electronic medical records noted that the ABI was checked in July 2017 where the right ABI was normal limits and the left ABI could not be ascertained due to pain with cuff pressure but the waveforms are within normal limits. her arterial duplex study scheduled for April 27. 10/08/2016 -- the patient has various reasons for not having a compression on and for the last 3 days she has had no compression on her left lower extremity either due to pain or the lack of nursing help. She does not use her juxta lites either. 10/15/2016 -- the patient did not keep her appointment for arterial duplex study on April 27 and I have asked her to reschedule this. Her pain is out of proportion with the physical findings and she continuously fails to wear a compression wraps and cuts them off because she says she cannot tolerate the pain. She does not use her juxta lites either. 10/22/2016 -- he has rescheduled her arterial duplex study to May 21 and her pain today is a bit better. She has not been wearing her juxta lites on her right lower extremity but now understands that she needs to do this. She did tolerate the to press compression wrap on her left lower extremity 10/29/2016 --arterial duplex study is scheduled for next week and overall she has been tolerating her compression wraps and also using her juxta lites on her right lower extremity 11/05/2016 -- the right ABI was 0.95 the left was 1.03. The digit TBI is on the right was 0.83 on the left was 0.92 and she had biphasic flow through these vessels. The impression was that of normal lower extremity arterial study. 11/12/2016 -- her pain is minimal and she is doing very well overall. 11/26/2016 -- she has got juxta lites and her insurance will not pay for additional dual layer  compression stockings. She is going to order some from  [place]. 05/12/2017 -- her juxta lites are very old and too big for her and these have not been helping with compression. She did get 20-30 mm compression stockings from  but she and her husband are unable to put these on. I believe she will benefit from bilateral Extremit-ease, compression stockings and we will measure her for these today. 05/20/2017 -- lymphedema on the left lower extremity has increased a lot and she has a open ulceration as a result of this. The right lower extremity is looking pretty good. She has decided to by the compression stockings herself and will get reimbursed by the home health, at a later date. 05/27/2017 -- her sciatica is bothering her a lot and she thought her left leg pain was caused due to the compression wrap and hence removed it and has significant lymphedema. There is no inflammation on this left lower extremity. 06/17/17 on evaluation today patient appears to be doing very well and in fact is completely healed in regard to her ulcerations. Unfortunately however she does have continued issues with lymphedema nonetheless. We did order compression garments for her unfortunately she states that the size that she received were large although we ordered medium. Obviously this means she is not getting the optimal compression. She does not have those with her today and therefore we could not confirm and contact the company on her behalf. Nonetheless she does state that she is going to have her husband bring them by tomorrow so that we can verify and then get in touch with the company. No fevers, chills, nausea, or vomiting noted at this time. Overall patient is doing better otherwise and I'm pleased with the progress she has made. 07/01/17 on evaluation today patient appears to be doing very well in regard to her bilateral lower extremity she does not have any openings at this point which is excellent  news. Overall I'm pleased with how things have progressed up to this time. Since she is doing so well we did order her compression which we are seeing her today to ensure that it fits her properly and everything is doing well in that regard and then subsequently she will be discharged. ============ Old Notes: 03/31/16 patient presents today for evaluation concerning open wounds that she has over the left medial ankle region as well as the left dorsal foot. She has previously had this occur although it has been healed for a number of months after having this for about a year prior until her hospitalization on 01/05/16. At that point in time it appears that she was admitted to the hospital for left lower extremity cellulitis and was placed on vancomycin and Zosyn at that point. Eventually upon discharge on January 15, 2016 she was placed on doxycycline at that point in time. Later on 03/27/16 positive wound culture growing Escherichia coli this was switched to amoxicillin. Currently she tells me that she is having pain radiated to be a 7 out of 10 which can be as high as 10 out of 10 with palpation and manipulation of the wound. This wound appears to be mainly venous in nature due to the bilateral lower extremity venous stasis/lymphedema. This is definitely much worse on her left than the right side. She does have type 1 diabetes mellitus, hypertension, morbid obesity, and is wheelchair dependent.during the course of the hospital stay a blood culture was also obtained and fortunately appeared negative. She also had an x-ray of the tibia/fibula on the left which showed no acute bone  abnormality. Her white blood cell count which was performed last on 03/25/16 was 7.3, hemoglobin 12.8, protein 7.1, albumin 3.0. Her urine culture appeared to be negative for any specific organisms. Patient did have a left lower extremity venous duplex evaluation for DVT. This did not include venous reflux studies but  fortunately was negative for DVT. Patient also had arterial studies performed which revealed that she had a normal ABI on the right though this was unable to be performed on the left secondary to pain that she was having around the ankle region due to the wound. However it was stated on report that she had biphasic pulses and apparently good blood flow. ========== 06/03/17 she is here in follow-up evaluation for right lower extremity ulcer. The right lower sure he has healed but she has reopened to the left medial malleolus and dorsal foot with weeping. She is waiting for new compression garments to arrive from home health, the previous compression garments were ill fitting. We will continue with compression bilaterally and follow-up in 2 weeks Readmission: 08/05/17 on evaluation today patient appears to be doing somewhat poorly in regard to her left lower extremity especially although the right lower extremity has a small area which may no longer be open. She has been having a lot of drainage from the left lower extremity however he tells me that she has not been able to use the EXTREMIT- EASE Compression at this point. She states that she did better and was able to actually apply the Juxta-Lite compression although the wound that she has is too large and therefore really does not compress which is why she cannot wear it at this point. She has no one who can help her put it on regular basis her son can sometimes but he's not able to do it most of the time. I do believe that's why she has begun to weave and have issues as she is currently yet again. No fevers, chills, nausea, or vomiting noted at this time. Patient is no evidence of dementia. 08/12/17 on evaluation today patient appears to still be doing fairly well in regard to the draining areas/weeping areas at this point. With that being said she unfortunately did go to the ER yesterday due to what was felt to be possibly a cellulitis. They  place her on doxycycline by mouth and discharge her home. She definitely was not admitted. With that being said she states she has had more discomfort which has been unusual for her even compared to prior times and she's had infections.08/12/17 on evaluation today patient appears to still be doing fairly well in regard to the draining areas/weeping areas at this point. With that being said she unfortunately did go to the ER yesterday due to what was felt to be possibly a cellulitis. They place her on doxycycline by mouth and discharge her home. She definitely was not admitted. With that being said she states she has had more discomfort which has been unusual for her even compared to prior times and she's had infections. 08/19/17 put evaluation today patient tells me that she's been having a lot of what sounds to be neuropathic type pain in regard to her left lower extremity. She has been using over-the-counter topical bins again which some believe. That in order to apply the she actually remove the wrap we put on her last Wednesday on Thursday. Subsequently she has not had anything on compression wise since that time. The good news is a lot of the weeping  areas appear to have closed at this point again I believe she would do better with compression but we are struggling to get her to actually use what she needs to at this point. No fevers, chills, nausea, or vomiting noted at this time. 09/03/17 on evaluation today patient appears to be doing okay in regard to her lower extremities in regard to the lymphedema and weeping. Fortunately she does not seem to show any signs of infection at this point she does have a little bit of weeping occurring in the right medial malleolus area. With that being said this does not appear to be too significant which is good news. 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 She has severe skin damage  secondary to both of these features involving the dorsal left foot and medial left ankle and lower leg. Still has open areas in the left anterior foot. The area on the right closed over. She uses her own juxta light stockings. She does not have an arterial issue 09/16/17 on evaluation today patient actually appears to be doing excellent in regard to her bilateral lower extremity swelling. The Juxta-Lite compression wrap seem to be doing very well for her. She has not however been using the portion that goes over her foot. Her left foot still is draining a little bit not nearly as significant as it has been in the past but still I do believe that she likely needs to utilize the full wrap including the foot portion of this will improve as well. She also has been apparently putting on a significant amount of Vaseline which also think is not helpful for her. I recommended that if she feels she needs something for moisturizer Eucerin will probably be better. 09/30/17 on evaluation today patient presents with several new open areas in regard to her left lower extremity although these appear to be minimal and mainly seem to be more moisture breakdown than anything. Fortunately she does not seem to have any evidence of infection which is great news. She has been tolerating the dressing changes without complication we are using silver alginate on the foot she has been using AB pads to have the legs and using her Juxta-Lite compression which seems to be controlling her swelling very well. Overall I'm pleased with the poor way she has progressed. 10/14/17 on evaluation today patient appears to be doing better in regard to her left lower extremity areas of weeping. She does still have some discomfort although in general this does not appear to be as macerated and I think it is progressing nicely. I do think she still needs to wear the foot portion of her Juxta-Lite in order to get the most benefit from the wrap  obviously. She states she understands. Fortunately there does not appear to be evidence of infection at this time which is great news. 10/28/17 on evaluation today patient appears to be doing excellent in regard to her left lower extremity. She has just a couple areas that are still open and seem to be causing any trouble whatsoever. For that reason I think that she is definitely headed in the right direction the spots are very tiny compared to what we have been dealing with in the past. 11/11/17 on evaluation today patient appears to have a right lateral lower extremity ulcer that has opened since I last saw her. She states this is where the home health nurse that was coming out remove the dressing without  wetting the alginate first. Nonetheless I do not know if this is indeed the case or not but more importantly we have not ordered home help to be coming out for her wounds at all. I'm unsure as to why they are coming out and we're gonna have to check on this and get things situated in that regard. With that being said we currently really do not need them to be coming out as the patient has been taking care of her leg herself without complication and no issues. In fact she was doing much better prior to nursing coming out. 11/25/17 on evaluation today patient actually appears to be doing fairly well in regard to her left lower extremity swelling. In fact she has very little area of weeping at this point there's just a small spot on the lateral portion of her right leg that still has me just a little bit more concerned as far as wanting to see this clear up before I discharge her to caring for this at home. Nonetheless overall she has made excellent progress. 12/09/17 on evaluation today patient appears to be doing rather well in regard to her lower extremity edema. She does have some weeping still in the left lower extremity although the big area we were taking care of two weeks ago actually has  closed and she has another area of weeping on the left lower extremity immediately as well is the top of her foot. She does not currently have lymphedema pumps she has been wearing her compression daily on a regular basis as directed. With that being said I think she may benefit from lymphedema pumps. 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 We had a very difficult time getting and keeping this under control. 12/23/17 on evaluation today patient actually appears to be doing a little bit more poorly in regard to her bilateral lower extremities. She has been tolerating the Juxta-Lite compression wraps. Unfortunately she has two new ulcers on the right lower extremity and left lower Trinity ulceration seems to be larger. Obviously this is not good news. She has been tolerating the dressings without complication. 12/30/17 on evaluation today patient actually appears to be doing much better in regard to her bilateral lower extremity edema. She continues to have some issues with ulcerations and in fact there appears to be one spot on each leg where the wrap may have caused a little bit of a blister which is subsequently opened up at this point is given her pain. Fortunately it does not appear to be any evidence of infection which is good news. No fevers chills noted. 01/13/18 on evaluation today patient appears to be doing rather well in regard to her bilateral lower extremities. The dressings did get kind of stuck as far as the wound beds are concerned but again I think this is mainly due to the fact that she actually seems to be showing signs of healing which is good news. She's not having as much drainage therefore she was having more of the dressing sticking. Nonetheless overall I feel like her swelling is dramatically down compared to  previous. 01/20/18 on evaluation today patient unfortunately though she's doing better in most regards has a large blister on the left anterior lower extremity where she is draining quite significantly. Subsequently this is going to need debridement today in order  to see what's underneath and ensure she does not continue to trapping fluid at this location. Nonetheless No fevers, chills, nausea, or vomiting noted at this time. 01/27/18 on evaluation today patient appears to be doing rather well at this point in regard to her right lower extremity there's just a very small area that she still has open at this point. With that being said I do believe that she is tolerating the compression wraps very well in making good progress. Home health is coming out at this point to see her. Her left lower extremity on the lateral portion is actually what still mainly open and causing her some discomfort for the most part 02/10/18 on evaluation today patient actually appears to be doing very well in regard to her right lower extremity were all the ulcers appear to be completely close. In regard to the left lower extremity she does have two areas still open and some leaking from the dorsal surface of her foot but this still seems to be doing much better to me in general. 02/24/18 on evaluation today patient actually appears to be doing much better in regard to her right lower extremity this is still completely healed. Her left lower extremity is also doing much better fortunately she has no evidence of infection. The one area that is gonna require some debridement is still on the left anterior shin. Fortunately this is not hurting her as badly today. 03/10/18 on evaluation today patient appears to be doing better in some regards although she has a little bit more open area on the dorsal foot and she also has some issues on the medial portion of the left lower extremity which is actually new and somewhat deep. With that  being said there fortunately does not appear to be any significant signs of infection which is good news. No fevers, chills, nausea, or vomiting noted at this time. In general her swelling seems to be doing fairly well which is good news. 03/31/18 on evaluation today patient presents for follow-up concerning her left lower extremity lymphedema. Unfortunately she has been doing a little bit more poorly since I last saw her in regard to the amount of weeping that she is experiencing. She's also having some increased pain in the anterior shin location. Unfortunately I do not feel like the patient is making such good progress at this point a few weeks back she was definitely doing much better. 04/07/18 on evaluation today patient actually appears to be showing some signs of improvement as far as the left lower extremity is concerned. She has been tolerating the dressing changes and it does appear that the Drawtex did better for her. With that being said unfortunately home health is stating that they cannot obtain the Drawtex going forward. Nonetheless we're gonna have to check and see what they may be able to get the alginate they were using was getting stuck in causing new areas of skin being pulled all that with and subsequently weep and calls her to worsen overall this is the first time we've seen improvement at this time. 04/14/18 on evaluation today patient actually appears to be doing rather well at this point there does not appear to be any evidence of infection at this time and she is actually doing excellent in regard to the weeping in fact she almost has no openings remaining even compared to just last week this is a dramatic improvement. No fevers chills noted 04/21/18 evaluation today patient actually appears to be doing very  well. She in fact is has a small area on the posterior lower extremity location and she has a small area on the dorsal surface of her foot that are still open both of  which are very close to closing. We're hoping this will be close shortly. She brought her Juxta-Lite wrap with her today hoping that would be able to put her in it unfortunately I don't think were quite at that point yet but we're getting closer. 04/28/18 upon evaluation today patient actually appears to be doing excellent in regard to her left lower extremity ulcer. In fact the region on the posterior lower extremity actually is much smaller than previously noted. Overall I'm very happy with the progress she has made. She again did bring her Juxta-Lite although we're not quite ready for that yet. 05/11/18 upon evaluation today patient actually appears to be doing in general fairly well in regard to her left lower Trinity. The swelling is very well controlled. With that being said she has a new area on the left anterior lower extremity as well as between the first and second toes of her left foot that was not present during the last evaluation. The region of her posterior left lower extremity actually appears to be almost completely healed. To be honest I'm very pleased with the way that stands. Nonetheless I do believe that the lotion may be keeping the area to moist as far as her legs are concerned subsequently I'm gonna consider discontinuing that today. 05/26/18 on evaluation today patient appears to be doing rather well in regard to her left lower should be ulcers. In fact everything appears to be close except for a very small area on the left posterior lower extremity. Fortunately there does not appear to be any evidence of infection at this time. Overall very pleased with her progress. 06/02/18 and evaluation today patient actually appears to be doing very well in regard to her lower extremity ulcers. She has one small area that still continues to weep that I think may benefit her being able to justify lotion and user Juxta-Lite wraps versus continued to wrap her. Nonetheless I think this is  something we can definitely look into at this point. 06/23/18 on evaluation today patient unfortunately has openings of her bilateral lower extremities. In general she seems to be doing much worse than when I last saw her just as far as her overall health standpoint is concerned. She states that her discomfort is mainly due to neuropathy she's not having any other issues otherwise. No fevers, chills, nausea, or vomiting noted at this time. 06/30/18 on evaluation today patient actually appears to be doing a little worse in regard to her right lower extremity her left lower extremity of doing fairly well. Fortunately there is no sign of infection at this time. She has been tolerating the dressing changes without complication. Home health did not come out like they were supposed to for the appropriate wrap changes. They stated that they never received the orders from [place] which were fax. Nonetheless we will send a copy of the orders with the patient today as well. 07/07/18 on evaluation today patient appears to be doing much better in regard to lower extremities. She still has several openings bilaterally although since I last saw her her legs did show obvious signs of infection when she later saw her nurse. Subsequently a culture was obtained and she is been placed on Bactrim and Keflex. Fortunately things seem to be looking much better it  does appear she likely had an infection. Again last week we'd even discussed it but again there really was not any obvious sign that she had infection therefore we held off on the antibiotics. Nonetheless I'm glad she's doing better today. 07/14/18 on evaluation today patient appears to be doing much better regarding her bilateral lower Trinity's. In fact on the right lower for me there's nothing open at this point there are some dry skin areas at the sites where she had infection. Fortunately there is no evidence of systemic infection which is excellent news. No fevers  chills noted 07/21/18 on evaluation today patient actually appears to be doing much better in regard to her left lower extremity ulcers. She is making good progress and overall I feel like she's improving each time I see her. She's having no pain I do feel like the infection is completely resolved which is excellent news. No fevers, chills, nausea, or vomiting noted at this time. 07/28/18 on evaluation today patient appears to be doing very well in regard to her left lower [employer]. Everything seems to be showing signs of improvement which is excellent news. Overall very pleased with the progress that has been made. Fortunately there's no evidence of active infection at this time also excellent news. 08/04/18 on evaluation today patient appears to be doing more poorly in regard to her bilateral lower extremities. She has two new areas open up on the right and these were completely closed as of last week. She still has the two spots on the left which in my pinion seem to be doing better. Fortunately there's no evidence of infection again at this point. 08/11/18 on evaluation today patient actually appears to be doing very well in regard to her bilateral lower Trinity wounds that all seem to be doing better and are measures smaller today. Fortunately there's no signs of infection. No fevers, chills, nausea, or vomiting noted at this time. 08/18/18 on evaluation today patient actually appears to be doing about the same inverter bilateral lower extremities. She continues to have areas that blistering open as was drain that fortunately nothing too significant. Overall I feel like Drawtex may have done better for her however compared to the collagen. 08/25/18 on evaluation today patient appears to be doing a little bit more poorly today even compared to last time I saw her. Again I'm not exactly sure why she's making worse progress over the past several weeks. I'm beginning to wonder if there is some  kind of underlying low level infection causing this issue. I did actually take a culture from the left anterior lower extremity but it was a new wound draining quite a bit at this point. Unfortunately she also seems to be having more pain which is what also makes me worried about the possibility of infection. This is despite never erythema noted at this point. 09/01/18 on evaluation today patient actually appears to be doing a little worse even compared to last week in regard to bilateral lower extremities. She did go to the hospital on the 16th was given a dose of IV Zosyn and then discharged with a recommendation to continue with the Bactrim that I previously prescribed for her. Nonetheless she is still having a lot of discomfort she tells me as well at this time. This is definitely unfortunate. No fevers, chills, nausea, or vomiting noted at this time. 09/08/18 on evaluation today patient's bilateral lower extremities actually appear to be shown signs of improvement which is good news.  Fortunately there does not appear to be any signs of active infection I think the anabiotic is helping in this regard. Overall I'm very pleased with how she is progressing. 09/15/18 patient was actually seen in ER yesterday due to her legs as well unfortunately. She states she's been having a lot of pain and discomfort as well as a lot of drainage. Upon inspection today the patient does have a lot of swelling and drainage I feel like this is more related to lymphedema and poor fluid control than it is to infection based on what I'm seeing. The physician in the emergency department also doubted that the patient was having a significant infection nonetheless I see no evidence of infection obvious at this point although I do see evidence of poor fluid control. She still not using a compression pumps, she is not elevating due to her lift chair as well as her hospital bed being broken, and she really is not keeping her  legs up as much as they should be and also has been taking off her wraps. All this combined I think has led to poor fluid control and to be honest she may be somewhat volume overloaded in general as well. I recommend that she may need to contact your physician to see if a prescription for a diuretic would be beneficial in their opinion. As long as this is safe I think it would likely help her. 09/29/18 on evaluation today patient's left lower extremity actually appears to be doing quite a bit better. At least compared to last time that I saw her. She still has a large area where she is draining from but there's a lot of new skin speckled trout and in fact there's more new skin that there are open areas of weeping and drainage at this point. This is good news. With regard to the right lower extremity this is doing much better with the only open area that I really see being a dry spot on the right lateral ankle currently. Fortunately there's no signs of active infection at this time which is good news. No fevers, chills, nausea, or vomiting noted at this time. The patient seems somewhat stressed and overwhelmed during the visit today she was very lethargic as such. She does and she is not taking any pain medications at this point. Apparently according to her husband are also in the process of moving which is probably taking its toll on her as well. 10/06/18 on evaluation today patient appears to be doing rather well in regard to her lower extremities compared to last evaluation. Fortunately there's no signs of active infection. She tells me she did have an appointment with her primary. Nonetheless he was concerned that the wounds were somewhat deep based on pictures but we never actually saw her legs. She states that he had her somewhat worried due to the fact that she was fearing now that she was [place] have to have an amputation. With that being said based on what I'm seeing check she looks  better this week that she has the last two times I've seen her with much less drainage I'm actually pleased in this regard. That doesn't mean that she's out of the water but again I do not think what the point of talking about education at all in regard to her leg. She is very happy to hear this. She is also not having as much pain as she was having last week. 10/13/18 unfortunately on evaluation today patient still  continues to have a significant amount of drainage she's not letting home health actually apply the compression dressings at this point. She's trying to use of Juxta-Lite of the top of Kerlex and the second layer of the three layer compression wrap. With that being said she just does not seem to be making as good a progress as I would expect if she was having the compression applied and in place on a regular basis. No fevers, chills, nausea, or vomiting noted at this time. 10/20/18 on evaluation today patient appears to be doing a little better in regard to her bilateral lower extremity ulcers. In fact the right lower extremity seems to be healed she doesn't even have any openings at this point left lower extremity though still somewhat macerated seems to be showing signs of new skin growth at multiple locations throughout. Fortunately there's no evidence of active infection at this time. No fevers, chills, nausea, or vomiting noted at this time. 10/27/18 on evaluation today patient appears to be doing much better in regard to her left lower Trinity ulcer. She's been tolerating the laptop complication and has minimal drainage noted at this point. Fortunately there's no signs of active infection at this time. No fevers, chills, nausea, or vomiting noted at this time. 11/03/18 on evaluation today patient actually appears to be doing excellent in regard to her left lower extremity. She is having very little drainage at this point there does not appear to be any significant signs of infection  overall very pleased with how things have gone. She is likewise extremely pleased still and seems to be making wonderful progress week to week. I do believe antibiotics were helpful for her. Her primary care provider did place on amateur clean since I last saw her. 11/17/18 on evaluation today patient appears to be doing worse in regard to her bilateral lower extremities at this point. She is been tolerating the dressing changes without complication. With that being said she typically takes the Coban off fairly quickly upon arriving home even after being seen here in the clinic and does not allow home health reapply command as part of the dressing at home. Therefore she said no compression essentially since I last saw her as best I can tell. With that being said I think it shows and how much swelling she has in the open wounds that are noted at this point. Fortunately there's no signs of infection but unfortunately if she doesn't get this under control I think she will end up with infection and more significant issues. 11/24/18 on evaluation today patient actually appears to be doing somewhat better in regard to her bilateral lower extremities. 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 Nonetheless that is doing better currently so she should be able to attempt reinitiating the lymphedema pumps at this time. No fevers, chills, nausea, or vomiting noted at this time. 12/01/18 upon evaluation today patient's left lower extremity appears to be doing quite well unfortunately her right lower extremity is not doing nearly as well. She has been tolerating the dressing changes without complication unfortunately she did not keep a wrap on the right at this time. Nonetheless I believe this has led to increased swelling and weeping in the world is actually much larger  than during the last evaluation with her. 12/08/18 on  evaluation today patient appears to be doing about the same at this point in regard to her right lower extremity. There is some more palatable to touch I'm concerned about the possibility of there being some infection although I think the main issue is she's not keeping her compression wrap on which in turn is not allowing this area to heal appropriately. 12/22/18 on evaluation today patient appears to be doing better in regard to left lower extremity unfortunately significantly worse in regard to the right lower extremity. The areas of blistering and necrotic superficial tissue have spread and again this does not really appear to be signs of infection and all she just doesn't seem to be doing nearly as well is what she has been in the past. Overall I feel like the Augmentin did absolutely nothing for her she doesn't seem to have any infection again I really didn't think so last time either is more of a potential preventative measure and hoping that this would make some difference but I think the main issue is she's not wearing her compression. She tells me she cannot wear the [place] been we put on she takes it off pretty much upon getting home. Subsequently she worshiped Juxta-Lite when I questioned her about how often she wears it this is no more than three hours a day obviously that leaves 21 hours that she has no compression and this is obviously not doing well for her. Overall I'm concerned that if things continue to worsen she is at great risk of both infection as well as losing her leg. 01/05/19 on evaluation today patient appears to be doing well in regard to her left lower extremity which he is allowing [place] to wrap and not so well with regard to her right lower extremity which she is not allowing [place] to really wrap and keep the wrap on. She states that it hurts too badly whenever it's wrapped and she ends up having to take it off. She's  been using the Juxta-Lite she tells me up to six hours a day although I question whether or not that's really been the case to be honest. Previously she told me three hours today nonetheless obviously the legs as long as the wrap is doing great when she is not is doing much more poorly. 01/12/2019 on evaluation today patient actually appears to be doing a little better in my opinion with regard to her right lower extremity ulcer. She has a small open area on the left lower extremity unfortunately but again this I think is part of the normal fluctuation of what she is going to have to expect with regard to her legs especially when she is not using her lymphedema pumps on a regular basis. Subsequently based on what I am seeing today I think that she does seem to be doing slightly better with regard to her right lower extremity she did see her primary care provider on Monday they felt she had an infection and placed her on 2 antibiotics. Both Cipro and clindamycin. Subsequently again she seems possibly to be doing a little bit better in regards to the right lower extremity she also tells me however she has been wearing the compression wrap over the past week since I spoke with her as well that is a Kerlix and Coban wrap on the right. No fevers, chills, nausea, vomiting, or diarrhea. 01/19/2019 on evaluation today patient appears to be doing better with regard to her bilateral lower extremities especially the right. I  feel like the compression has been beneficial for her which is great news. She did get a call from her primary care provider on her way here today telling her that she did have methicillin-resistant Staphylococcus aureus and he was calling in a couple new antibiotics for her including a ointment to be applied she tells me 3 times a day. With that being said this sounds like likely to be Bactroban which I think could be applied with each dressing/wrap change but I would not be able to  accommodate her applying this 3 times a day. She is in agreement with the least doing this we will add that to her orders today. 01/26/2019 on evaluation today patient actually appears to be doing much better with regard to her right lower extremity. Her left lower extremity is also doing quite well all things considering. Fortunately there is no evidence of active infection at this time. No fevers, chills, nausea, vomiting, or diarrhea. 02/02/2019 on evaluation today patient appears to be doing much better compared to her last evaluation. Little by little off like her right leg is returning more towards normal. There does not appear to be any signs of active infection and overall she seems to be doing quite well which is great news. I am very pleased in this regard. No fevers, chills, nausea, vomiting, or diarrhea. 02/09/2019 upon evaluation today patient appears to be doing better with regard to her bilateral lower extremities. She has been tolerating the dressing changes without complication. Fortunately there is no signs of active infection at this time. No fevers, chills, nausea, vomiting, or diarrhea. 02/23/2019 on evaluation today patient actually appears to be doing quite well with regard to her bilateral lower extremities. She has been tolerating the dressing changes without complication. She is even used her pumps one time and states that she really felt like it felt good. With that being said she seems to be in good spirits and her legs appear to be doing excellent. 03/09/2019 on evaluation today patient appears to be doing well with regard to her right lower extremity there are no open wounds at this time she is having some discomfort but I feel like this is more neuropathy than anything. With regard to her left lower extremity she had several areas scattered around that she does have some weeping and drainage from but again overall she does not appear to be having any significant issues and  no evidence of infection at this time which is good news. 03/23/2019 on evaluation today patient appears to be doing well with regard to her right lower extremity which she tells me is still close she is using her juxta light here. Her left lower extremity she mainly just has an area on the foot which is still slightly draining although this also is doing great. Overall very pleased at this time. 04/06/2019 patient appears to be doing a little bit worse in regard to her left lower extremity upon evaluation today. She feels like this could be becoming infected again which she had issues with previous. Fortunately there is no signs of systemic infection but again this is always a struggle with her with her legs she will go from doing well to not so well in a very short amount of time. 04/20/2019 on evaluation today patient actually appears to be doing quite well with regard to her right lower extremity I do not see any signs of active infection at this time. Fortunately there is no fever chills noted. She  is still taking the antibiotics which I prescribed for her at this point. In regard to the left lower extremity I do feel like some of these areas are better although again she still is having weeping from several locations at this time. 04/27/2019 on evaluation today patient appears to be doing about the same if not slightly worse in regard to her left lower extremity ulcers. She tells me when questioned that she has been sleeping in her Hoveround chair in fact she tells me she falls asleep without even knowing it. I think she is spending a whole lot of time in the chair and less time walking and moving around which is not good for her legs either. On top of that she is in a seated position which is also the worst position she is not really elevating her legs and she is also not using her lymphedema pumps. All this is good to contribute to worsening of her condition in general. 05/18/2019 on  evaluation today patient appears to be doing well with regard to her lower extremity on the right in fact this is showing no signs of any open wounds at this time. On the left she is continuing to have issues with areas that do drain. Some of the regions have healed and there are couple areas that have reopened. She did go to the ER per the patient according to recommendations from the home health nurse due to what she was seen when she came out on 05/13/2019. Subsequently she felt like the patient needed to go to the hospital due to the fact that again she was having "milky white discharge" from her leg. Nonetheless she had and then was placed on doxycycline and subsequently seems to be doing better. 06/01/2019 upon evaluation today patient appears to be doing really in my opinion about the same. I do not see any signs of active infection which is good news. Overall she still has wounds over the bilateral lower extremities she has reopened on the right but this appears to be more of a crack where there is weeping/edema coming from the region. I do not see any evidence of infection at either site based on what I visualized today. 07/13/2019 upon evaluation today patient appears to be doing a little worse compared to last time I saw her. She since has been in the hospital from 06/21/2019 through 06/29/2019. This was secondary to having Covid. During that time they did apply lotion to her legs which unfortunately has caused her to develop a myriad of open wounds on her lower extremities. Her legs do appear to be doing better as far as the overall appearance is concerned but nonetheless she does have more open and weeping areas. 07/27/2019 upon evaluation today patient appears to be doing more poorly to be honest in regard to her left lower extremity in particular. There is no signs of systemic infection although I do believe she may have local infection. She notes she has been having a lot of  blue/green drainage which is consistent potentially with Pseudomonas. That may be something that we need to consider here as well. The doxycycline does not seem to have been helping. 08/03/2019 upon evaluation today patient appears to be doing a little better in my opinion compared to last week's evaluation. Her culture I did review today and she is on appropriate medications to help treat the Enterobacter that was noted. Overall I feel like that is good news. With that being said she is unfortunately  continuing to have a lot of drainage and though it is doing better I still think she has a long ways to go to get things dried up in general. Fortunately there is no signs of systemic infection. 08/10/2019 upon evaluation today patient appears to be doing may be slightly better in regard to her left lower extremity the right lower extremity is doing much better. Fortunately there is no signs of infection right now which is good news. No fevers, chills, nausea, vomiting, or diarrhea. 08/24/2019 on evaluation today patient appears to be doing slightly better in regard to her lower extremities. The left lower extremity seems to be healed the right lower extremity is doing better though not completely healed as far as the openings are concerned. She has some generalized issues here with edema and weeping secondary to her lymphedema though again I do believe this is little bit drier compared to prior weeks evaluations. In general I am very pleased with how things seem to be progressing. No fevers, chills, nausea, vomiting, or diarrhea. 08/31/2019 upon evaluation today patient actually seems to making some progress here with regard to the left lower extremity in particular. She has been tolerating the dressing changes without complication. Fortunately there is no signs of active infection at this time. No fevers, chills, nausea, vomiting, or diarrhea. She did see Dr. Doren Custard and he did note that she did have a  issue with the left great saphenous vein and the small saphenous vein in the leg. With that being said he was concerned about the possibility of laser ablation not being extremely successful. He also mentioned a small risk of DVT associated with the procedure. However if the wounds do not continue to improve he stated that that would probably be the way to go. Fortunately the patient's legs do seem to be doing much better. 09/07/2019 upon evaluation today patient appears to be doing better with regard to her lower extremities. She has been tolerating the dressing changes without complication. With that being said she is showing signs of improvement and overall very pleased. There are some areas on her leg that I think we do need to debride we discussed this last week the patient is in agreement with doing that as long as it does not hurt too badly. 09/14/2019 upon evaluation today patient appears to be doing decently well with regard to her left lower extremity. She is not having near as much weeping as she has had in the past things seem to be drying up which is good news. There is no signs of active infection at this time. 09/21/19 upon evaluation today patient appears to be doing better in regard overall to her bilateral lower extremities. She again has less open than she did previous and each week I feel like this is getting better. Fortunately there is no signs of active infection at this time. No fevers, chills, nausea, vomiting, or diarrhea. 09/28/2019 upon evaluation today patient actually appears to be showing signs of improvement with regard to her left lower extremity. Unfortunately the right medial lower extremity around the ankle region has reopened to some degree but this appears to be minimal still which is good news. There is no signs of active infection at this time which is also good news. Electronic Signature(s) Signed: 09/28/2019 11:24:01 AM By: [name] PA-C Entered By:  [name] on 09/28/2019 11:24:01 -------------------------------------------------------------------------------- Physical Exam Details Patient Name: Date of Service: [name], [name] 09/28/2019 10:30 AM Medical Record [id number] Patient  Account Number: [id number] Date of Birth/Sex: Treating RN: 1950-12-15 (69 y.o. [name] Primary Care Provider: [name] Other Clinician: Referring Provider: Treating Provider/Extender:Stone III, [name], FRED Weeks in Treatment: 112 Constitutional Obese and well-hydrated in no acute distress. Respiratory normal breathing without difficulty. Psychiatric this patient is able to make decisions and demonstrates good insight into disease process. Alert and Oriented x 3. pleasant and cooperative. Notes At this point today I did actually perform debridement along the left leg and multiple scattered areas along with removing some of the dead/sloughing off skin some of this had new skin underneath but nonetheless was trapping fluid. I was able to remove a significant portion of this I debrided roughly 1/3 of the total size of the wound which is reflected in the debridement note. The entire wound surface was not debrided. Electronic Signature(s) Signed: 09/28/2019 11:24:29 AM By: [name] PA-C Entered By: [name] on 09/28/2019 11:24:29 -------------------------------------------------------------------------------- Physician Orders Details Patient Name: Date of Service: [name]. 09/28/2019 10:30 AM Medical Record [id number] Patient Account Number: [id number] Date of Birth/Sex: Treating RN: January 07, 1951 (69 y.o. [name] Primary Care Provider: [name] Other Clinician: Referring Provider: Treating Provider/Extender:Stone III, [name], FRED Weeks in Treatment: [phone] Verbal / Phone Orders: No Diagnosis Coding ICD-10 Coding Code Description E11.622 Type 2 diabetes mellitus with other  skin ulcer I89.0 Lymphedema, not elsewhere classified I87.331 Chronic venous hypertension (idiopathic) with ulcer and inflammation of right lower extremity I87.332 Chronic venous hypertension (idiopathic) with ulcer and inflammation of left lower extremity L97.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 leg with fat layer exposed L97.822 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 - with [facility] Dressing Change Frequency Wound #61 Left,Circumferential Lower Leg Change dressing three times week. Wound #64 Left,Dorsal Foot Change dressing three times week. Skin Barriers/Peri-Wound Care Barrier cream - zinc oxide cream to any macerated areas Moisturizing lotion - to right leg daily Wound Cleansing Clean wound with Wound Cleanser - all wounds May shower with protection. Primary Wound Dressing Wound #61 Left,Circumferential Lower Leg Calcium Alginate with Silver Wound #64 Left,Dorsal Foot Calcium Alginate with Silver Wound #65 Right,Medial Lower Leg Calcium Alginate with Silver Secondary Dressing Wound #61 Left,Circumferential Lower Leg Dry Gauze - all wounds ABD pad - as needed Zetuvit or Kerramax - as needed Wound #64 Left,Dorsal Foot Dry Gauze - all wounds ABD pad - as needed Wound #65 Right,Medial Lower Leg Dry Gauze Edema Control 3 Layer Compression System - Left Lower Extremity Avoid standing for long periods of time - walking is encouraged Elevate legs to the level of the heart or above for 30 minutes daily and/or when sitting, a frequency of: - do not sleep in chair with feet dangling, MUST elevate legs while sitting Exercise  regularly Support Garment 20-30 mm/Hg pressure to: - Juxtalite to right lower leg daily Segmental Compressive Device. - lymphedema pumps 60 minutes 1- 2 times per day Off-Loading Turn and reposition every 2 hours Additional Orders / Instructions Follow Nutritious Diet - To include vitamin A, vitamin C, and Zinc along with increased protein intake. [place] [facility] for wound care. - [facility] Electronic Signature(s) Signed: 09/28/2019 4:44:47 PM By: Joaquim Lai  Dixie Dials PA-C Signed: 09/28/2019 5:43:24 PM By: [name] RN, BSN Entered By: [name] on 09/28/2019 11:04:06 -------------------------------------------------------------------------------- Problem List Details Patient Name: Date of Service: [name]. 09/28/2019 10:30 AM Medical Record [id number] Patient Account Number: [id number] Date of Birth/Sex: Treating RN: Jun 29, 1950 (69 y.o. [name] Primary Care Provider: [name] Other Clinician: Referring Provider: Treating Provider/Extender:Stone III, [name], FRED Weeks in Treatment: 112 Active Problems ICD-10 Evaluated Encounter Code Description Active Date Today Diagnosis E11.622 Type 2 diabetes mellitus with other skin ulcer 08/05/2017 No Yes I89.0 Lymphedema, not elsewhere classified 08/05/2017 No Yes I87.331 Chronic venous hypertension (idiopathic) with ulcer 08/05/2017 No Yes and inflammation of right lower extremity I87.332 Chronic venous hypertension (idiopathic) with ulcer 08/05/2017 No Yes and inflammation of left lower extremity L97.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 08/05/2017 No Yes leg with fat layer exposed L97.822 Non-pressure chronic ulcer of other part of left lower 08/05/2017 No Yes leg with fat layer exposed L97.522 Non-pressure chronic ulcer of other part of left foot 06/01/2019 No Yes with fat layer exposed I10 Essential (primary) hypertension 08/05/2017 No  Yes E66.01 Morbid (severe) obesity due to excess calories 08/05/2017 No Yes F41.8 Other specified anxiety disorders 08/05/2017 No Yes R53.1 Weakness 08/05/2017 No Yes Inactive Problems Resolved Problems Electronic Signature(s) Signed: 09/28/2019 10:26:13 AM By: [name] PA-C Entered By: [name] on 09/28/2019 10:26:12 -------------------------------------------------------------------------------- Progress Note Details Patient Name: Date of Service: [name]. 09/28/2019 10:30 AM Medical Record [id number] Patient Account Number: [id number] Date of Birth/Sex: Treating RN: March 12, 1951 (69 y.o. [name] Primary Care Provider: [name] Other Clinician: Referring Provider: Treating Provider/Extender:Stone III, [name], FRED Weeks in Treatment: 112 Subjective Chief Complaint Information obtained from Patient Bilateral reoccurring LE ulcers History of Present Illness (HPI) this patient has been seen a couple of times before and returns with recurrent problems to her right and left lower extremity with swelling and weeping ulcerations due to not wearing her compression stockings which she had been advised to do during her last discharge, at the end of June 2018. During her last visit the patient had had normal arterial blood flow and her venous reflux study did not necessitate any surgical intervention. She was recommended compression and elevation and wound care. After prolonged treatment the patient was completely healed but she has been noncompliant with wearing or compressions.. She was here last week with an outpatient return visit planned but the patient came in a very poor general condition with altered mental status and was rushed to the ER on my request. With a history of hypertension, diabetes, TIA and right-sided weakness she was set up for an MRI on her brain and cervical spine and was sent to [facility]. Getting an MRI done was very  difficult but once the workup was done she was found not to have any spinal stenosis, epidural abscess or hematoma or discitis. This was radiculopathy to be treated as an outpatient and she was given a follow-up appointment. Today she is feeling much better alert and oriented and has come to reevaluate her bilateral lower extremity lymphedema and ulceration 03/25/2017 -- she was admitted to the hospital on 03/16/2017 and discharged on 03/18/2017 with left leg cellulitis and ulceration. She was started on vancomycin and Zosyn and x-ray showed no bony involvement. She was treated for a cellulitis with IV antibiotics changed to Rocephin and Flagyl and was discharged on oral Keflex and doxycycline to complete a 7 day course.  Last hemoglobin A1c was 7.1 and her other ailments including hypertension got asthma were appropriately treated. 05/06/2017 -- she is awaiting the right size of compression stockings from [place] but other than that has been doing well. ====== Old notes 69 year old patient was seen one time last October and was lost to follow-up. She has recurrent problems with weeping and ulceration of her left lower extremity and has swelling of this for several years. It has been worse for the last 2 months. Past medical history is significant for diabetes mellitus type 2, hypertension, gout, morbid obesity, depressive disorders, hiatal hernia, migraines, status post knee surgery, risk of a cholecystectomy, vaginal hysterectomy and breast biopsy. She is not a smoker. As noted before she has never had a venous duplex study and an arterial ABI study was attempted but the left lower extremity was noncompressible 10/01/2016 -- had a lower extremity venous duplex reflux evaluation which showed no evidence of deep vein reflux in the right or left lower extremity, and no evidence of great saphenous vein reflux more than 500 ms in the right or left lower extremity, and the left small saphenous  vein is incompetent but no vascular consult was recommended. review of her electronic medical records noted that the ABI was checked in July 2017 where the right ABI was normal limits and the left ABI could not be ascertained due to pain with cuff pressure but the waveforms are within normal limits. her arterial duplex study scheduled for April 27. 10/08/2016 -- the patient has various reasons for not having a compression on and for the last 3 days she has had no compression on her left lower extremity either due to pain or the lack of nursing help. She does not use her juxta lites either. 10/15/2016 -- the patient did not keep her appointment for arterial duplex study on April 27 and I have asked her to reschedule this. Her pain is out of proportion with the physical findings and she continuously fails to wear a compression wraps and cuts them off because she says she cannot tolerate the pain. She does not use her juxta lites either. 10/22/2016 -- he has rescheduled her arterial duplex study to May 21 and her pain today is a bit better. She has not been wearing her juxta lites on her right lower extremity but now understands that she needs to do this. She did tolerate the to press compression wrap on her left lower extremity 10/29/2016 --arterial duplex study is scheduled for next week and overall she has been tolerating her compression wraps and also using her juxta lites on her right lower extremity 11/05/2016 -- the right ABI was 0.95 the left was 1.03. The digit TBI is on the right was 0.83 on the left was 0.92 and she had biphasic flow through these vessels. The impression was that of normal lower extremity arterial study. 11/12/2016 -- her pain is minimal and she is doing very well overall. 11/26/2016 -- she has got juxta lites and her insurance will not pay for additional dual layer compression stockings. She is going to order some from [place]. 05/12/2017 -- her juxta lites are very  old and too big for her and these have not been helping with compression. She did get 20-30 mm compression stockings from [place] but she and her husband are unable to put these on. I believe she will benefit from bilateral Extremit-ease, compression stockings and we will measure her for these today. 05/20/2017 -- lymphedema on the left lower extremity  has increased a lot and she has a open ulceration as a result of this. The right lower extremity is looking pretty good. She has decided to by the compression stockings herself and will get reimbursed by the home health, at a later date. 05/27/2017 -- her sciatica is bothering her a lot and she thought her left leg pain was caused due to the compression wrap and hence removed it and has significant lymphedema. There is no inflammation on this left lower extremity. 06/17/17 on evaluation today patient appears to be doing very well and in fact is completely healed in regard to her ulcerations. Unfortunately however she does have continued issues with lymphedema nonetheless. We did order compression garments for her unfortunately she states that the size that she received were large although we ordered medium. Obviously this means she is not getting the optimal compression. She does not have those with her today and therefore we could not confirm and contact the company on her behalf. Nonetheless she does state that she is going to have her husband bring them by tomorrow so that we can verify and then get in touch with the company. No fevers, chills, nausea, or vomiting noted at this time. Overall patient is doing better otherwise and I'm pleased with the progress she has made. 07/01/17 on evaluation today patient appears to be doing very well in regard to her bilateral lower extremity she does not have any openings at this point which is excellent news. Overall I'm pleased with how things have progressed up to this time. Since she is doing so well we  did order her compression which we are seeing her today to ensure that it fits her properly and everything is doing well in that regard and then subsequently she will be discharged. ============ Old Notes: 03/31/16 patient presents today for evaluation concerning open wounds that she has over the left medial ankle region as well as the left dorsal foot. She has previously had this occur although it has been healed for a number of months after having this for about a year prior until her hospitalization on 01/05/16. At that point in time it appears that she was admitted to the hospital for left lower extremity cellulitis and was placed on vancomycin and Zosyn at that point. Eventually upon discharge on January 15, 2016 she was placed on doxycycline at that point in time. Later on 03/27/16 positive wound culture growing Escherichia coli this was switched to amoxicillin. Currently she tells me that she is having pain radiated to be a 7 out of 10 which can be as high as 10 out of 10 with palpation and manipulation of the wound. This wound appears to be mainly venous in nature due to the bilateral lower extremity venous stasis/lymphedema. This is definitely much worse on her left than the right side. She does have type 1 diabetes mellitus, hypertension, morbid obesity, and is wheelchair dependent.during the course of the hospital stay a blood culture was also obtained and fortunately appeared negative. She also had an x-ray of the tibia/fibula on the left which showed no acute bone abnormality. Her white blood cell count which was performed last on 03/25/16 was 7.3, hemoglobin 12.8, protein 7.1, albumin 3.0. Her urine culture appeared to be negative for any specific organisms. Patient did have a left lower extremity venous duplex evaluation for DVT. This did not include venous reflux studies but fortunately was negative for DVT. Patient also had arterial studies performed which revealed that she had  a normal ABI on the right though this was unable to be performed on the left secondary to pain that she was having around the ankle region due to the wound. However it was stated on report that she had biphasic pulses and apparently good blood flow. ========== 06/03/17 she is here in follow-up evaluation for right lower extremity ulcer. The right lower sure he has healed but she has reopened to the left medial malleolus and dorsal foot with weeping. She is waiting for new compression garments to arrive from home health, the previous compression garments were ill fitting. We will continue with compression bilaterally and follow-up in 2 weeks Readmission: 08/05/17 on evaluation today patient appears to be doing somewhat poorly in regard to her left lower extremity especially although the right lower extremity has a small area which may no longer be open. She has been having a lot of drainage from the left lower extremity however he tells me that she has not been able to use the EXTREMIT- EASE Compression at this point. She states that she did better and was able to actually apply the Juxta-Lite compression although the wound that she has is too large and therefore really does not compress which is why she cannot wear it at this point. She has no one who can help her put it on regular basis her son can sometimes but he's not able to do it most of the time. I do believe that's why she has begun to weave and have issues as she is currently yet again. No fevers, chills, nausea, or vomiting noted at this time. Patient is no evidence of dementia. 08/12/17 on evaluation today patient appears to still be doing fairly well in regard to the draining areas/weeping areas at this point. With that being said she unfortunately did go to the ER yesterday due to what was felt to be possibly a cellulitis. They place her on doxycycline by mouth and discharge her home. She definitely was not admitted. With that being  said she states she has had more discomfort which has been unusual for her even compared to prior times and she's had infections.08/12/17 on evaluation today patient appears to still be doing fairly well in regard to the draining areas/weeping areas at this point. With that being said she unfortunately did go to the ER yesterday due to what was felt to be possibly a cellulitis. They place her on doxycycline by mouth and discharge her home. She definitely was not admitted. With that being said she states she has had more discomfort which has been unusual for her even compared to prior times and she's had infections. 08/19/17 put evaluation today patient tells me that she's been having a lot of what sounds to be neuropathic type pain in regard to her left lower extremity. She has been using over-the-counter topical bins again which some believe. That in order to apply the she actually remove the wrap we put on her last Wednesday on Thursday. Subsequently she has not had anything on compression wise since that time. The good news is a lot of the weeping areas appear to have closed at this point again I believe she would do better with compression but we are struggling to get her to actually use what she needs to at this point. No fevers, chills, nausea, or vomiting noted at this time. 09/03/17 on evaluation today patient appears to be doing okay in regard to her lower extremities in regard to the lymphedema and weeping. Fortunately  she does not seem to show any signs of infection at this point she does have a little bit of weeping occurring in the right medial malleolus area. With that being said this does not appear to be too significant which is good news. 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 She has severe skin damage secondary to both of these features involving the dorsal left foot and medial left ankle and lower leg. Still  has open areas in the left anterior foot. The area on the right closed over. She uses her own juxta light stockings. She does not have an arterial issue 09/16/17 on evaluation today patient actually appears to be doing excellent in regard to her bilateral lower extremity swelling. The Juxta-Lite compression wrap seem to be doing very well for her. She has not however been using the portion that goes over her foot. Her left foot still is draining a little bit not nearly as significant as it has been in the past but still I do believe that she likely needs to utilize the full wrap including the foot portion of this will improve as well. She also has been apparently putting on a significant amount of Vaseline which also think is not helpful for her. I recommended that if she feels she needs something for moisturizer Eucerin will probably be better. 09/30/17 on evaluation today patient presents with several new open areas in regard to her left lower extremity although these appear to be minimal and mainly seem to be more moisture breakdown than anything. Fortunately she does not seem to have any evidence of infection which is great news. She has been tolerating the dressing changes without complication we are using silver alginate on the foot she has been using AB pads to have the legs and using her Juxta-Lite compression which seems to be controlling her swelling very well. Overall I'm pleased with the poor way she has progressed. 10/14/17 on evaluation today patient appears to be doing better in regard to her left lower extremity areas of weeping. She does still have some discomfort although in general this does not appear to be as macerated and I think it is progressing nicely. I do think she still needs to wear the foot portion of her Juxta-Lite in order to get the most benefit from the wrap obviously. She states she understands. Fortunately there does not appear to be evidence of infection at this time  which is great news. 10/28/17 on evaluation today patient appears to be doing excellent in regard to her left lower extremity. She has just a couple areas that are still open and seem to be causing any trouble whatsoever. For that reason I think that she is definitely headed in the right direction the spots are very tiny compared to what we have been dealing with in the past. 11/11/17 on evaluation today patient appears to have a right lateral lower extremity ulcer that has opened since I last saw her. She states this is where the home health nurse that was coming out remove the dressing without wetting the alginate first. Nonetheless I do not know if this is indeed the case or not but more importantly we have not ordered home help to be coming out for her wounds at all. I'm unsure as to why they are coming out and we're gonna have to check on this and get things situated in that regard. With that being said we currently really do not  need them to be coming out as the patient has been taking care of her leg herself without complication and no issues. In fact she was doing much better prior to nursing coming out. 11/25/17 on evaluation today patient actually appears to be doing fairly well in regard to her left lower extremity swelling. In fact she has very little area of weeping at this point there's just a small spot on the lateral portion of her right leg that still has me just a little bit more concerned as far as wanting to see this clear up before I discharge her to caring for this at home. Nonetheless overall she has made excellent progress. 12/09/17 on evaluation today patient appears to be doing rather well in regard to her lower extremity edema. She does have some weeping still in the left lower extremity although the big area we were taking care of two weeks ago actually has closed and she has another area of weeping on the left lower extremity immediately as well is the top of her foot. She  does not currently have lymphedema pumps she has been wearing her compression daily on a regular basis as directed. With that being said I think she may benefit from lymphedema pumps. 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 We had a very difficult time getting and keeping this under control. 12/23/17 on evaluation today patient actually appears to be doing a little bit more poorly in regard to her bilateral lower extremities. She has been tolerating the Juxta-Lite compression wraps. Unfortunately she has two new ulcers on the right lower extremity and left lower Trinity ulceration seems to be larger. Obviously this is not good news. She has been tolerating the dressings without complication. 12/30/17 on evaluation today patient actually appears to be doing much better in regard to her bilateral lower extremity edema. She continues to have some issues with ulcerations and in fact there appears to be one spot on each leg where the wrap may have caused a little bit of a blister which is subsequently opened up at this point is given her pain. Fortunately it does not appear to be any evidence of infection which is good news. No fevers chills noted. 01/13/18 on evaluation today patient appears to be doing rather well in regard to her bilateral lower extremities. The dressings did get kind of stuck as far as the wound beds are concerned but again I think this is mainly due to the fact that she actually seems to be showing signs of healing which is good news. She's not having as much drainage therefore she was having more of the dressing sticking. Nonetheless overall I feel like her swelling is dramatically down compared to previous. 01/20/18 on evaluation today patient unfortunately though she's doing better in most regards has a large blister on the left  anterior lower extremity where she is draining quite significantly. Subsequently this is going to need debridement today in order to see what's underneath and ensure she does not continue to trapping fluid at this location. Nonetheless No fevers, chills, nausea, or vomiting noted at this time. 01/27/18 on evaluation today patient appears to be doing rather well at this point in regard to her right lower extremity there's just a very small area that she still has open at this point. With that being said I do  believe that she is tolerating the compression wraps very well in making good progress. Home health is coming out at this point to see her. Her left lower extremity on the lateral portion is actually what still mainly open and causing her some discomfort for the most part 02/10/18 on evaluation today patient actually appears to be doing very well in regard to her right lower extremity were all the ulcers appear to be completely close. In regard to the left lower extremity she does have two areas still open and some leaking from the dorsal surface of her foot but this still seems to be doing much better to me in general. 02/24/18 on evaluation today patient actually appears to be doing much better in regard to her right lower extremity this is still completely healed. Her left lower extremity is also doing much better fortunately she has no evidence of infection. The one area that is gonna require some debridement is still on the left anterior shin. Fortunately this is not hurting her as badly today. 03/10/18 on evaluation today patient appears to be doing better in some regards although she has a little bit more open area on the dorsal foot and she also has some issues on the medial portion of the left lower extremity which is actually new and somewhat deep. With that being said there fortunately does not appear to be any significant signs of infection which is good news. No fevers, chills, nausea,  or vomiting noted at this time. In general her swelling seems to be doing fairly well which is good news. 03/31/18 on evaluation today patient presents for follow-up concerning her left lower extremity lymphedema. Unfortunately she has been doing a little bit more poorly since I last saw her in regard to the amount of weeping that she is experiencing. She's also having some increased pain in the anterior shin location. Unfortunately I do not feel like the patient is making such good progress at this point a few weeks back she was definitely doing much better. 04/07/18 on evaluation today patient actually appears to be showing some signs of improvement as far as the left lower extremity is concerned. She has been tolerating the dressing changes and it does appear that the Drawtex did better for her. With that being said unfortunately home health is stating that they cannot obtain the Drawtex going forward. Nonetheless we're gonna have to check and see what they may be able to get the alginate they were using was getting stuck in causing new areas of skin being pulled all that with and subsequently weep and calls her to worsen overall this is the first time we've seen improvement at this time. 04/14/18 on evaluation today patient actually appears to be doing rather well at this point there does not appear to be any evidence of infection at this time and she is actually doing excellent in regard to the weeping in fact she almost has no openings remaining even compared to just last week this is a dramatic improvement. No fevers chills noted 04/21/18 evaluation today patient actually appears to be doing very well. She in fact is has a small area on the posterior lower extremity location and she has a small area on the dorsal surface of her foot that are still open both of which are very close to closing. We're hoping this will be close shortly. She brought her Juxta-Lite wrap with her today hoping that  would be able to put her in it unfortunately I  don't think were quite at that point yet but we're getting closer. 04/28/18 upon evaluation today patient actually appears to be doing excellent in regard to her left lower extremity ulcer. In fact the region on the posterior lower extremity actually is much smaller than previously noted. Overall I'm very happy with the progress she has made. She again did bring her Juxta-Lite although we're not quite ready for that yet. 05/11/18 upon evaluation today patient actually appears to be doing in general fairly well in regard to her left lower Trinity. The swelling is very well controlled. With that being said she has a new area on the left anterior lower extremity as well as between the first and second toes of her left foot that was not present during the last evaluation. The region of her posterior left lower extremity actually appears to be almost completely healed. To be honest I'm very pleased with the way that stands. Nonetheless I do believe that the lotion may be keeping the area to moist as far as her legs are concerned subsequently I'm gonna consider discontinuing that today. 05/26/18 on evaluation today patient appears to be doing rather well in regard to her left lower should be ulcers. In fact everything appears to be close except for a very small area on the left posterior lower extremity. Fortunately there does not appear to be any evidence of infection at this time. Overall very pleased with her progress. 06/02/18 and evaluation today patient actually appears to be doing very well in regard to her lower extremity ulcers. She has one small area that still continues to weep that I think may benefit her being able to justify lotion and user Juxta-Lite wraps versus continued to wrap her. Nonetheless I think this is something we can definitely look into at this point. 06/23/18 on evaluation today patient unfortunately has openings of her bilateral  lower extremities. In general she seems to be doing much worse than when I last saw her just as far as her overall health standpoint is concerned. She states that her discomfort is mainly due to neuropathy she's not having any other issues otherwise. No fevers, chills, nausea, or vomiting noted at this time. 06/30/18 on evaluation today patient actually appears to be doing a little worse in regard to her right lower extremity her left lower extremity of doing fairly well. Fortunately there is no sign of infection at this time. She has been tolerating the dressing changes without complication. Home health did not come out like they were supposed to for the appropriate wrap changes. They stated that they never received the orders from [place] which were fax. Nonetheless we will send a copy of the orders with the patient today as well. 07/07/18 on evaluation today patient appears to be doing much better in regard to lower extremities. She still has several openings bilaterally although since I last saw her her legs did show obvious signs of infection when she later saw her nurse. Subsequently a culture was obtained and she is been placed on Bactrim and Keflex. Fortunately things seem to be looking much better it does appear she likely had an infection. Again last week we'd even discussed it but again there really was not any obvious sign that she had infection therefore we held off on the antibiotics. Nonetheless I'm glad she's doing better today. 07/14/18 on evaluation today patient appears to be doing much better regarding her bilateral lower Trinity's. In fact on the right lower for me there's nothing open  at this point there are some dry skin areas at the sites where she had infection. Fortunately there is no evidence of systemic infection which is excellent news. No fevers chills noted 07/21/18 on evaluation today patient actually appears to be doing much better in regard to her left lower  extremity ulcers. She is making good progress and overall I feel like she's improving each time I see her. She's having no pain I do feel like the infection is completely resolved which is excellent news. No fevers, chills, nausea, or vomiting noted at this time. 07/28/18 on evaluation today patient appears to be doing very well in regard to her left lower [employer]. Everything seems to be showing signs of improvement which is excellent news. Overall very pleased with the progress that has been made. Fortunately there's no evidence of active infection at this time also excellent news. 08/04/18 on evaluation today patient appears to be doing more poorly in regard to her bilateral lower extremities. She has two new areas open up on the right and these were completely closed as of last week. She still has the two spots on the left which in my pinion seem to be doing better. Fortunately there's no evidence of infection again at this point. 08/11/18 on evaluation today patient actually appears to be doing very well in regard to her bilateral lower Trinity wounds that all seem to be doing better and are measures smaller today. Fortunately there's no signs of infection. No fevers, chills, nausea, or vomiting noted at this time. 08/18/18 on evaluation today patient actually appears to be doing about the same inverter bilateral lower extremities. She continues to have areas that blistering open as was drain that fortunately nothing too significant. Overall I feel like Drawtex may have done better for her however compared to the collagen. 08/25/18 on evaluation today patient appears to be doing a little bit more poorly today even compared to last time I saw her. Again I'm not exactly sure why she's making worse progress over the past several weeks. I'm beginning to wonder if there is some kind of underlying low level infection causing this issue. I did actually take a culture from the left anterior lower  extremity but it was a new wound draining quite a bit at this point. Unfortunately she also seems to be having more pain which is what also makes me worried about the possibility of infection. This is despite never erythema noted at this point. 09/01/18 on evaluation today patient actually appears to be doing a little worse even compared to last week in regard to bilateral lower extremities. She did go to the hospital on the 16th was given a dose of IV Zosyn and then discharged with a recommendation to continue with the Bactrim that I previously prescribed for her. Nonetheless she is still having a lot of discomfort she tells me as well at this time. This is definitely unfortunate. No fevers, chills, nausea, or vomiting noted at this time. 09/08/18 on evaluation today patient's bilateral lower extremities actually appear to be shown signs of improvement which is good news. Fortunately there does not appear to be any signs of active infection I think the anabiotic is helping in this regard. Overall I'm very pleased with how she is progressing. 09/15/18 patient was actually seen in ER yesterday due to her legs as well unfortunately. She states she's been having a lot of pain and discomfort as well as a lot of drainage. Upon inspection today the patient  does have a lot of swelling and drainage I feel like this is more related to lymphedema and poor fluid control than it is to infection based on what I'm seeing. The physician in the emergency department also doubted that the patient was having a significant infection nonetheless I see no evidence of infection obvious at this point although I do see evidence of poor fluid control. She still not using a compression pumps, she is not elevating due to her lift chair as well as her hospital bed being broken, and she really is not keeping her legs up as much as they should be and also has been taking off her wraps. All this combined I think has led to poor fluid  control and to be honest she may be somewhat volume overloaded in general as well. I recommend that she may need to contact your physician to see if a prescription for a diuretic would be beneficial in their opinion. As long as this is safe I think it would likely help her. 09/29/18 on evaluation today patient's left lower extremity actually appears to be doing quite a bit better. At least compared to last time that I saw her. She still has a large area where she is draining from but there's a lot of new skin speckled trout and in fact there's more new skin that there are open areas of weeping and drainage at this point. This is good news. With regard to the right lower extremity this is doing much better with the only open area that I really see being a dry spot on the right lateral ankle currently. Fortunately there's no signs of active infection at this time which is good news. No fevers, chills, nausea, or vomiting noted at this time. The patient seems somewhat stressed and overwhelmed during the visit today she was very lethargic as such. She does and she is not taking any pain medications at this point. Apparently according to her husband are also in the process of moving which is probably taking its toll on her as well. 10/06/18 on evaluation today patient appears to be doing rather well in regard to her lower extremities compared to last evaluation. Fortunately there's no signs of active infection. She tells me she did have an appointment with her primary. Nonetheless he was concerned that the wounds were somewhat deep based on pictures but we never actually saw her legs. She states that he had her somewhat worried due to the fact that she was fearing now that she was [place] have to have an amputation. With that being said based on what I'm seeing check she looks better this week that she has the last two times I've seen her with much less drainage I'm actually pleased in this regard. That  doesn't mean that she's out of the water but again I do not think what the point of talking about education at all in regard to her leg. She is very happy to hear this. She is also not having as much pain as she was having last week. 10/13/18 unfortunately on evaluation today patient still continues to have a significant amount of drainage she's not letting home health actually apply the compression dressings at this point. She's trying to use of Juxta-Lite of the top of Kerlex and the second layer of the three layer compression wrap. With that being said she just does not seem to be making as good a progress as I would expect if she was having the compression  applied and in place on a regular basis. No fevers, chills, nausea, or vomiting noted at this time. 10/20/18 on evaluation today patient appears to be doing a little better in regard to her bilateral lower extremity ulcers. In fact the right lower extremity seems to be healed she doesn't even have any openings at this point left lower extremity though still somewhat macerated seems to be showing signs of new skin growth at multiple locations throughout. Fortunately there's no evidence of active infection at this time. No fevers, chills, nausea, or vomiting noted at this time. 10/27/18 on evaluation today patient appears to be doing much better in regard to her left lower Trinity ulcer. She's been tolerating the laptop complication and has minimal drainage noted at this point. Fortunately there's no signs of active infection at this time. No fevers, chills, nausea, or vomiting noted at this time. 11/03/18 on evaluation today patient actually appears to be doing excellent in regard to her left lower extremity. She is having very little drainage at this point there does not appear to be any significant signs of infection overall very pleased with how things have gone. She is likewise extremely pleased still and seems to be making wonderful progress  week to week. I do believe antibiotics were helpful for her. Her primary care provider did place on amateur clean since I last saw her. 11/17/18 on evaluation today patient appears to be doing worse in regard to her bilateral lower extremities at this point. She is been tolerating the dressing changes without complication. With that being said she typically takes the [place] off fairly quickly upon arriving home even after being seen here in the clinic and does not allow home health reapply command as part of the dressing at home. Therefore she said no compression essentially since I last saw her as best I can tell. With that being said I think it shows and how much swelling she has in the open wounds that are noted at this point. Fortunately there's no signs of infection but unfortunately if she doesn't get this under control I think she will end up with infection and more significant issues. 11/24/18 on evaluation today patient actually appears to be doing somewhat better in regard to her bilateral lower extremities. 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 Nonetheless that is doing better currently so she should be able to attempt reinitiating the lymphedema pumps at this time. No fevers, chills, nausea, or vomiting noted at this time. 12/01/18 upon evaluation today patient's left lower extremity appears to be doing quite well unfortunately her right lower extremity is not doing nearly as well. She has been tolerating the dressing changes without complication unfortunately she did not keep a wrap on the right at this time. Nonetheless I believe this has led to increased swelling and weeping in the world is actually much larger than during the last evaluation with her. 12/08/18 on evaluation today patient appears to be doing about the same at this point in  regard to her right lower extremity. There is some more palatable to touch I'm concerned about the possibility of there being some infection although I think the main issue is she's not keeping her compression wrap on which in turn is not allowing this area to heal appropriately. 12/22/18 on evaluation today patient appears  to be doing better in regard to left lower extremity unfortunately significantly worse in regard to the right lower extremity. The areas of blistering and necrotic superficial tissue have spread and again this does not really appear to be signs of infection and all she just doesn't seem to be doing nearly as well is what she has been in the past. Overall I feel like the Augmentin did absolutely nothing for her she doesn't seem to have any infection again I really didn't think so last time either is more of a potential preventative measure and hoping that this would make some difference but I think the main issue is she's not wearing her compression. She tells me she cannot wear the [place] been we put on she takes it off pretty much upon getting home. Subsequently she worshiped Juxta-Lite when I questioned her about how often she wears it this is no more than three hours a day obviously that leaves 21 hours that she has no compression and this is obviously not doing well for her. Overall I'm concerned that if things continue to worsen she is at great risk of both infection as well as losing her leg. 01/05/19 on evaluation today patient appears to be doing well in regard to her left lower extremity which he is allowing [place] to wrap and not so well with regard to her right lower extremity which she is not allowing [place] to really wrap and keep the wrap on. She states that it hurts too badly whenever it's wrapped and she ends up having to take it off. She's been using the Juxta-Lite she tells me up to six hours a day although I question whether or not that's really been the case to be  honest. Previously she told me three hours today nonetheless obviously the legs as long as the wrap is doing great when she is not is doing much more poorly. 01/12/2019 on evaluation today patient actually appears to be doing a little better in my opinion with regard to her right lower extremity ulcer. She has a small open area on the left lower extremity unfortunately but again this I think is part of the normal fluctuation of what she is going to have to expect with regard to her legs especially when she is not using her lymphedema pumps on a regular basis. Subsequently based on what I am seeing today I think that she does seem to be doing slightly better with regard to her right lower extremity she did see her primary care provider on Monday they felt she had an infection and placed her on 2 antibiotics. Both Cipro and clindamycin. Subsequently again she seems possibly to be doing a little bit better in regards to the right lower extremity she also tells me however she has been wearing the compression wrap over the past week since I spoke with her as well that is a Kerlix and Coban wrap on the right. No fevers, chills, nausea, vomiting, or diarrhea. 01/19/2019 on evaluation today patient appears to be doing better with regard to her bilateral lower extremities especially the right. I feel like the compression has been beneficial for her which is great news. She did get a call from her primary care provider on her way here today telling her that she did have methicillin-resistant Staphylococcus aureus and he was calling in a couple new antibiotics for her including a ointment to be applied she tells me 3 times a day. With that being said this sounds like  likely to be Bactroban which I think could be applied with each dressing/wrap change but I would not be able to accommodate her applying this 3 times a day. She is in agreement with the least doing this we will add that to her orders  today. 01/26/2019 on evaluation today patient actually appears to be doing much better with regard to her right lower extremity. Her left lower extremity is also doing quite well all things considering. Fortunately there is no evidence of active infection at this time. No fevers, chills, nausea, vomiting, or diarrhea. 02/02/2019 on evaluation today patient appears to be doing much better compared to her last evaluation. Little by little off like her right leg is returning more towards normal. There does not appear to be any signs of active infection and overall she seems to be doing quite well which is great news. I am very pleased in this regard. No fevers, chills, nausea, vomiting, or diarrhea. 02/09/2019 upon evaluation today patient appears to be doing better with regard to her bilateral lower extremities. She has been tolerating the dressing changes without complication. Fortunately there is no signs of active infection at this time. No fevers, chills, nausea, vomiting, or diarrhea. 02/23/2019 on evaluation today patient actually appears to be doing quite well with regard to her bilateral lower extremities. She has been tolerating the dressing changes without complication. She is even used her pumps one time and states that she really felt like it felt good. With that being said she seems to be in good spirits and her legs appear to be doing excellent. 03/09/2019 on evaluation today patient appears to be doing well with regard to her right lower extremity there are no open wounds at this time she is having some discomfort but I feel like this is more neuropathy than anything. With regard to her left lower extremity she had several areas scattered around that she does have some weeping and drainage from but again overall she does not appear to be having any significant issues and no evidence of infection at this time which is good news. 03/23/2019 on evaluation today patient appears to be doing well  with regard to her right lower extremity which she tells me is still close she is using her juxta light here. Her left lower extremity she mainly just has an area on the foot which is still slightly draining although this also is doing great. Overall very pleased at this time. 04/06/2019 patient appears to be doing a little bit worse in regard to her left lower extremity upon evaluation today. She feels like this could be becoming infected again which she had issues with previous. Fortunately there is no signs of systemic infection but again this is always a struggle with her with her legs she will go from doing well to not so well in a very short amount of time. 04/20/2019 on evaluation today patient actually appears to be doing quite well with regard to her right lower extremity I do not see any signs of active infection at this time. Fortunately there is no fever chills noted. She is still taking the antibiotics which I prescribed for her at this point. In regard to the left lower extremity I do feel like some of these areas are better although again she still is having weeping from several locations at this time. 04/27/2019 on evaluation today patient appears to be doing about the same if not slightly worse in regard to her left lower extremity ulcers. She  tells me when questioned that she has been sleeping in her Hoveround chair in fact she tells me she falls asleep without even knowing it. I think she is spending a whole lot of time in the chair and less time walking and moving around which is not good for her legs either. On top of that she is in a seated position which is also the worst position she is not really elevating her legs and she is also not using her lymphedema pumps. All this is good to contribute to worsening of her condition in general. 05/18/2019 on evaluation today patient appears to be doing well with regard to her lower extremity on the right in fact this is showing no signs  of any open wounds at this time. On the left she is continuing to have issues with areas that do drain. Some of the regions have healed and there are couple areas that have reopened. She did go to the ER per the patient according to recommendations from the home health nurse due to what she was seen when she came out on 05/13/2019. Subsequently she felt like the patient needed to go to the hospital due to the fact that again she was having "milky white discharge" from her leg. Nonetheless she had and then was placed on doxycycline and subsequently seems to be doing better. 06/01/2019 upon evaluation today patient appears to be doing really in my opinion about the same. I do not see any signs of active infection which is good news. Overall she still has wounds over the bilateral lower extremities she has reopened on the right but this appears to be more of a crack where there is weeping/edema coming from the region. I do not see any evidence of infection at either site based on what I visualized today. 07/13/2019 upon evaluation today patient appears to be doing a little worse compared to last time I saw her. She since has been in the hospital from 06/21/2019 through 06/29/2019. This was secondary to having Covid. During that time they did apply lotion to her legs which unfortunately has caused her to develop a myriad of open wounds on her lower extremities. Her legs do appear to be doing better as far as the overall appearance is concerned but nonetheless she does have more open and weeping areas. 07/27/2019 upon evaluation today patient appears to be doing more poorly to be honest in regard to her left lower extremity in particular. There is no signs of systemic infection although I do believe she may have local infection. She notes she has been having a lot of blue/green drainage which is consistent potentially with Pseudomonas. That may be something that we need to consider here as well. The  doxycycline does not seem to have been helping. 08/03/2019 upon evaluation today patient appears to be doing a little better in my opinion compared to last week's evaluation. Her culture I did review today and she is on appropriate medications to help treat the Enterobacter that was noted. Overall I feel like that is good news. With that being said she is unfortunately continuing to have a lot of drainage and though it is doing better I still think she has a long ways to go to get things dried up in general. Fortunately there is no signs of systemic infection. 08/10/2019 upon evaluation today patient appears to be doing may be slightly better in regard to her left lower extremity the right lower extremity is doing much better. Fortunately there  is no signs of infection right now which is good news. No fevers, chills, nausea, vomiting, or diarrhea. 08/24/2019 on evaluation today patient appears to be doing slightly better in regard to her lower extremities. The left lower extremity seems to be healed the right lower extremity is doing better though not completely healed as far as the openings are concerned. She has some generalized issues here with edema and weeping secondary to her lymphedema though again I do believe this is little bit drier compared to prior weeks evaluations. In general I am very pleased with how things seem to be progressing. No fevers, chills, nausea, vomiting, or diarrhea. 08/31/2019 upon evaluation today patient actually seems to making some progress here with regard to the left lower extremity in particular. She has been tolerating the dressing changes without complication. Fortunately there is no signs of active infection at this time. No fevers, chills, nausea, vomiting, or diarrhea. She did see Dr. Doren Custard and he did note that she did have a issue with the left great saphenous vein and the small saphenous vein in the leg. With that being said he was concerned about the  possibility of laser ablation not being extremely successful. He also mentioned a small risk of DVT associated with the procedure. However if the wounds do not continue to improve he stated that that would probably be the way to go. Fortunately the patient's legs do seem to be doing much better. 09/07/2019 upon evaluation today patient appears to be doing better with regard to her lower extremities. She has been tolerating the dressing changes without complication. With that being said she is showing signs of improvement and overall very pleased. There are some areas on her leg that I think we do need to debride we discussed this last week the patient is in agreement with doing that as long as it does not hurt too badly. 09/14/2019 upon evaluation today patient appears to be doing decently well with regard to her left lower extremity. She is not having near as much weeping as she has had in the past things seem to be drying up which is good news. There is no signs of active infection at this time. 09/21/19 upon evaluation today patient appears to be doing better in regard overall to her bilateral lower extremities. She again has less open than she did previous and each week I feel like this is getting better. Fortunately there is no signs of active infection at this time. No fevers, chills, nausea, vomiting, or diarrhea. 09/28/2019 upon evaluation today patient actually appears to be showing signs of improvement with regard to her left lower extremity. Unfortunately the right medial lower extremity around the ankle region has reopened to some degree but this appears to be minimal still which is good news. There is no signs of active infection at this time which is also good news. Objective Constitutional Obese and well-hydrated in no acute distress. Vitals Time Taken: 9:59 AM, Height: 62 in, Weight: 335 lbs, BMI: 61.3, Temperature: 98.8 F, Pulse: 113 bpm, Respiratory Rate: 18 breaths/min, Blood  Pressure: 139/87 mmHg. Respiratory normal breathing without difficulty. Psychiatric this patient is able to make decisions and demonstrates good insight into disease process. Alert and Oriented x 3. pleasant and cooperative. General Notes: At this point today I did actually perform debridement along the left leg and multiple scattered areas along with removing some of the dead/sloughing off skin some of this had new skin underneath but nonetheless was trapping fluid. I was  able to remove a significant portion of this I debrided roughly 1/3 of the total size of the wound which is reflected in the debridement note. The entire wound surface was not debrided. Integumentary (Hair, Skin) Wound #61 status is Open. Original cause of wound was Gradually Appeared. The wound is located on the Left,Circumferential Lower Leg. The wound measures 16cm length x 22.5cm width x 0.1cm depth; 282.743cm^2 area and 28.274cm^3 volume. There is Fat Layer (Subcutaneous Tissue) Exposed exposed. There is no tunneling or undermining noted. There is a large amount of serous drainage noted. The wound margin is flat and intact. There is small (1-33%) pink, pale granulation within the wound bed. There is a large (67-100%) amount of necrotic tissue within the wound bed including Adherent Slough. Wound #64 status is Open. Original cause of wound was Gradually Appeared. The wound is located on the Left,Dorsal Foot. The wound measures 6.8cm length x 6.5cm width x 0.1cm depth; 34.715cm^2 area and 3.471cm^3 volume. There is Fat Layer (Subcutaneous Tissue) Exposed exposed. There is no tunneling or undermining noted. There is a small amount of serous drainage noted. The wound margin is flat and intact. There is small (1-33%) pink, pale granulation within the wound bed. There is a large (67-100%) amount of necrotic tissue within the wound bed including Adherent Slough. Wound #65 status is Open. Original cause of wound was Gradually  Appeared. The wound is located on the Right,Medial Lower Leg. The wound measures 0.7cm length x 2cm width x 0.1cm depth; 1.1cm^2 area and 0.11cm^3 volume. There is Fat Layer (Subcutaneous Tissue) Exposed exposed. There is no tunneling or undermining noted. There is a medium amount of serosanguineous drainage noted. The wound margin is distinct with the outline attached to the wound base. There is small (1-33%) red, pink granulation within the wound bed. There is a large (67-100%) amount of necrotic tissue within the wound bed including Adherent Slough. 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 There was a Excisional Skin/Subcutaneous Tissue Debridement with a total area of 35 sq cm performed by [name], PA. With the following instrument(s): Curette to remove Viable and Non-Viable tissue/material. Material removed includes Subcutaneous Tissue, Slough, and Skin: Epidermis after achieving pain control using Other (benzocaine 20% spray). No specimens were taken. A time out was conducted at 10:50, prior to the start of the procedure. A Minimum amount of bleeding was controlled with  Pressure. The procedure was tolerated well with a pain level of 2 throughout and a pain level of 1 following the procedure. Post Debridement Measurements: 16cm length x 22.5cm width x 0.1cm depth; 28.274cm^3 volume. Character of Wound/Ulcer Post Debridement is improved. Severity of Tissue Post Debridement is: Fat layer exposed. Post procedure Diagnosis Wound #61: Same as Pre-Procedure Pre-procedure diagnosis of Wound #61 is a Venous Leg Ulcer located on the  Left,Circumferential Lower Leg . There was a Three Layer Compression Therapy Procedure by Deon Pilling, RN. Post procedure Diagnosis Wound #61: Same as Pre-Procedure Wound #64 Pre-procedure diagnosis of Wound #64 is a Diabetic Wound/Ulcer of the Lower Extremity located on the Left,Dorsal Foot . There was a Three Layer Compression Therapy Procedure by Deon Pilling, RN. Post procedure Diagnosis Wound #64: Same as Pre-Procedure Plan Follow-up Appointments: Return Appointment in 2 weeks. - with Margarita Grizzle Dressing Change Frequency: Wound #61 Left,Circumferential Lower Leg: Change dressing three times week. Wound #64 Left,Dorsal Foot: Change dressing three times week. Skin Barriers/Peri-Wound Care: Barrier cream - zinc oxide cream to any macerated areas Moisturizing lotion - to right leg daily Wound Cleansing: Clean wound with Wound Cleanser - all wounds May shower with protection. Primary Wound Dressing: Wound #61 Left,Circumferential Lower Leg: Calcium Alginate with Silver Wound #64 Left,Dorsal Foot: Calcium Alginate with Silver Wound #65 Right,Medial Lower Leg: Calcium Alginate with Silver Secondary Dressing: Wound #61 Left,Circumferential Lower Leg: Dry Gauze - all wounds ABD pad - as needed Zetuvit or Kerramax - as needed Wound #64 Left,Dorsal Foot: Dry Gauze - all wounds ABD pad - as needed Wound #65 Right,Medial Lower Leg: Dry Gauze Edema Control: 3 Layer Compression System - Left Lower Extremity Avoid standing for long  periods of time - walking is encouraged Elevate legs to the level of the heart or above for 30 minutes daily and/or when sitting, a frequency of: - do not sleep in chair with feet dangling, MUST elevate legs while sitting Exercise regularly Support Garment 20-30 mm/Hg pressure to: - Juxtalite to right lower leg daily Segmental Compressive Device. - lymphedema pumps 60 minutes 1- 2 times per day Off-Loading: Turn and reposition every 2 hours Additional Orders / Instructions: Follow Nutritious Diet - To include vitamin A, vitamin C, and Zinc along with increased protein intake. Home Health: [facility] for [facility]. - [facility]. I would recommend currently that we go ahead and continue with the silver alginate dressing. I think this is good for her. 2. I am also can recommend that we continue with a 3 layer compression wrap the left lower extremity I think she can continue to use for Velcro wrap for the right lower extremity. 3. She needs to be elevating her legs and she tells me that she did "find her pumps" over the past week. She is to start using those as well in my opinion. We will see patient back for reevaluation in 2 weeks here in the clinic. If anything worsens or changes patient will contact our office for additional recommendations. Electronic Signature(s) Signed: 09/28/2019 11:45:52 AM By: [name] PA-C Entered By: [name] on 09/28/2019 11:45:52 -------------------------------------------------------------------------------- SuperBill Details Patient Name: Date of Service: [name] 09/28/2019 Medical Record [id number] Patient Account Number: [id number] Date of Birth/Sex: Treating RN: [birth year] (68 y.o. [name] Primary Care Provider: [name] Other Clinician: Referring Provider: Treating Provider/Extender:Stone III, [name], FRED Weeks in Treatment: 112 Diagnosis Coding ICD-10 Codes Code  Description E11.622 Type 2 diabetes mellitus with other skin ulcer I89.0 Lymphedema, not elsewhere classified I87.331 Chronic venous hypertension (idiopathic) with ulcer and inflammation of right lower extremity I87.332 Chronic venous hypertension (idiopathic) with ulcer and inflammation of left lower extremity L97.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 leg with fat layer exposed L97.822 Non-pressure chronic ulcer of other part of left lower leg with fat layer exposed L97.522 Non-pressure chronic ulcer of other part of left  foot with fat layer exposed I10 Essential (primary) hypertension E66.01 Morbid (severe) obesity due to excess calories F41.8 Other specified anxiety disorders R53.1 Weakness Facility Procedures The patient participates with Medicare or their insurance follows the Medicare Facility Guidelines: CPT4 Code Description Modifier Quantity [id number] 11042 - DEB SUBQ TISSUE 20 SQ CM/< 1 ICD-10 Diagnosis Description L97.822 Non-pressure chronic ulcer of  other part of left lower leg with fat layer exposed The patient participates with Medicare or their insurance follows the Medicare Facility Guidelines: [id number] 11045 - DEB SUBQ TISS EA ADDL 20CM 1 ICD-10 Diagnosis Description L97.822 Non-pressure chronic ulcer of other part of left lower leg with fat  layer exposed Physician Procedures CPT4 Code Description: [id number] 90301 - WC PHYS SUBQ TISS 20 SQ CM ICD-10 Diagnosis Description L97.822 Non-pressure chronic ulcer of other part of left lower leg Modifier: with fat layer Quantity: 1 exposed CPT4 Code Description: [id number] 93241 - WC PHYS SUBQ TISS EA ADDL 20 CM ICD-10 Diagnosis Description L97.822 Non-pressure chronic ulcer of other part of left lower leg Modifier: with fat layer Quantity: 1 exposed Electronic Signature(s) Signed: 09/28/2019 11:46:05 AM By: [name] PA-C Entered By: [name] on 09/28/2019 11:46:02

## 2019-09-28 NOTE — Progress Notes (Signed)
[name], SATTERWHITE ([id number]) Visit Report for 08/31/2019 Arrival Information Details Patient Name: Date of Service: [name], LAGER 08/31/2019 11:00 AM Medical Record [id number] Patient Account Number: [id number] Date of Birth/Sex: Treating RN: 1951-02-14 (69 y.o. [name] Primary Care Everson Mott: [name] Other Clinician: Referring Tamee Battin: Treating Pasha Gadison/Extender:Stone III, [name], FRED Weeks in Treatment: 108 Visit Information History Since Last Visit Added or deleted any medications: No Patient Arrived: Wheel Chair Any new allergies or adverse reactions: No Arrival Time: 11:25 Had a fall or experienced change in No activities of daily living that may affect Accompanied By: self risk of falls: Transfer Assistance: None Signs or symptoms of abuse/neglect since last No Patient Identification Verified: Yes visito Secondary Verification Process Yes Hospitalized since last visit: No Completed: Implantable device outside of the clinic excluding No Patient Requires Transmission-Based No cellular tissue based products placed in the center Precautions: since last visit: Patient Has Alerts: Yes Has Dressing in Place as Prescribed: Yes Patient Alerts: R ABI= Pain Present Now: Yes 1.01 L ABI = .99 Electronic Signature(s) Signed: 09/28/2019 9:21:08 AM By: Sandre Kitty Entered By: Sandre Kitty on 08/31/2019 11:26:54 -------------------------------------------------------------------------------- Compression Therapy Details Patient Name: Date of Service: [name]. 08/31/2019 11:00 AM Medical Record [id number] Patient Account Number: [id number] Date of Birth/Sex: Treating RN: 1951-01-28 (69 y.o. [name] Primary Care [name]: [name] Other Clinician: Referring [name]: Treating Bryson Gavia/Extender:Stone III, [name], FRED Weeks in Treatment: 108 Compression Therapy Performed for Wound Wound #61  Left,Circumferential Lower Leg Assessment: Performed By: Clinician Carlene Coria, RN Compression Type: Three Layer Post Procedure Diagnosis Same as Pre-procedure Electronic Signature(s) Signed: 08/31/2019 4:42:28 PM By: [name] RN, BSN Entered By: [name] on 08/31/2019 12:19:27 -------------------------------------------------------------------------------- Encounter Discharge Information Details Patient Name: Date of Service: [name]. 08/31/2019 11:00 AM Medical Record [id number] Patient Account Number: [id number] Date of Birth/Sex: Treating RN: 1950/08/06 (69 y.o. [name] Primary Care Brinn Westby: [name] Other Clinician: Referring Norlan Rann: Treating Liddie Chichester/Extender:Stone III, [name], FRED Weeks in Treatment: [id number] Encounter Discharge Information Items Discharge Condition: Stable Ambulatory Status: Wheelchair Discharge Destination: Home Transportation: Private Auto Accompanied By: self Schedule Follow-up Appointment: Yes Clinical Summary of Care: Electronic Signature(s) Signed: 08/31/2019 4:31:34 PM By: Deon Pilling Entered By: Deon Pilling on 08/31/2019 12:35:39 -------------------------------------------------------------------------------- Lower Extremity Assessment Details Patient Name: Date of Service: [name], [name] 08/31/2019 11:00 AM Medical Record [id number] Patient Account Number: [id number] Date of Birth/Sex: Treating RN: 1951-03-28 (69 y.o. [name] Primary Care [name]: [name] Other Clinician: Referring [name]: Treating [name]/Extender:Stone III, [name], FRED Weeks in Treatment: 108 Edema Assessment Assessed: [Left: Yes] [Right: No] Edema: [Left: Yes] [Right: Yes] Calf Left: Right: Point of Measurement: 37 cm From Medial Instep 43 cm cm Ankle Left: Right: Point of Measurement: 10 cm From Medial Instep 23 cm cm Electronic Signature(s) Signed: 08/31/2019 4:31:34 PM By: Deon Pilling Entered By: Deon Pilling on 08/31/2019 11:39:03 -------------------------------------------------------------------------------- [place] Details Patient Name: Date of Service: [name]. 08/31/2019 11:00 AM Medical Record [id number] Patient Account Number: [id number] Date of Birth/Sex: Treating RN: 09-28-1950 (69 y.o. [name] Primary Care Savannah Erbe: [name] Other Clinician: Referring Johnetta Sloniker: Treating [name]/Extender:Stone III, [name], FRED Weeks in Treatment: [phone] Active Inactive Venous Leg Ulcer Nursing Diagnoses: Actual venous Insuffiency (use after diagnosis is confirmed) Knowledge deficit related to disease process and management Goals: Patient will maintain optimal edema control Date Initiated: 08/12/2017 Target Resolution Date: 09/21/2019 Goal Status: Active Patient/caregiver will verbalize understanding of disease process  and disease management Date Initiated: 08/12/2017 Date Inactivated: 04/21/2018 Target Resolution Date: 04/24/2018 Goal Status: Met Interventions: Assess peripheral edema status every visit. Compression as ordered Treatment Activities: Therapeutic compression applied : 08/12/2017 Notes: Wound/Skin Impairment Nursing Diagnoses: Impaired tissue integrity Knowledge deficit related to ulceration/compromised skin integrity Goals: Patient/caregiver will verbalize understanding of skin care regimen Date Initiated: 08/12/2017 Target Resolution Date: 09/21/2019 Goal Status: Active Ulcer/skin breakdown will have a volume reduction of 30% by week 4 Date Initiated: 08/05/2017 Date Inactivated: 09/30/2017 Target Resolution Date: 10/03/2017 Goal Status: Met Ulcer/skin breakdown will have a volume reduction of 50% by week 8 Date Initiated: 09/30/2017 Date Inactivated: 10/28/2017 Target Resolution Date: 10/28/2017 Goal Status: Met Interventions: Assess patient/caregiver ability to perform ulcer/skin care regimen  upon admission and as needed Assess ulceration(s) every visit Provide education on ulcer and skin care Screen for HBO Treatment Activities: Patient referred to home care : 08/05/2017 Skin care regimen initiated : 08/05/2017 Topical wound management initiated : 08/05/2017 Notes: Electronic Signature(s) Signed: 08/31/2019 4:42:28 PM By: [name] RN, BSN Entered By: [name] on 08/31/2019 11:47:06 -------------------------------------------------------------------------------- Pain Assessment Details Patient Name: Date of Service: [name]. 08/31/2019 11:00 AM Medical Record [id number] Patient Account Number: [id number] Date of Birth/Sex: Treating RN: January 08, 1951 (69 y.o. [name] Primary Care Wafaa Deemer: [name] Other Clinician: Referring Harvey Matlack: Treating Alyanna Stoermer/Extender:Stone III, [name], FRED Weeks in Treatment: 108 Active Problems Location of Pain Severity and Description of Pain Patient Has Paino Yes Site Locations Rate the pain. Current Pain Level: 6 Pain Management and Medication Current Pain Management: Electronic Signature(s) Signed: 08/31/2019 4:42:28 PM By: [name] RN, BSN Signed: 09/28/2019 9:21:08 AM By: Sandre Kitty Entered By: Sandre Kitty on 08/31/2019 11:29:50 -------------------------------------------------------------------------------- Patient/Caregiver Education Details Patient Name: [name] 3/17/2021andnbsp11:00 Date of Service: AM Medical Record [id number] Number: Patient Account Number: [id number] Treating RN: Date of Birth/Gender: [birth year] (68 y.o. [name]) Other Clinician: Primary Care Physician:SMITH, [place] Villita III, [place] Referring Physician: Physician/Extender: Wilhelmenia Blase in Treatment: 108 Education Assessment Education Provided To: Patient Education Topics Provided Venous: Methods: Explain/Verbal Responses: Reinforcements needed, State  content correctly Wound/Skin Impairment: Methods: Explain/Verbal Responses: Reinforcements needed, State content correctly Electronic Signature(s) Signed: 08/31/2019 4:42:28 PM By: [name] RN, BSN Entered By: [name] on 08/31/2019 12:18:42 -------------------------------------------------------------------------------- Wound Assessment Details Patient Name: Date of Service: [name]. 08/31/2019 11:00 AM Medical Record [id number] Patient Account Number: [id number] Date of Birth/Sex: Treating RN: Aug 01, 1950 (69 y.o. [name] Primary Care Anjela Cassara: [name] Other Clinician: Referring Arabel Barcenas: Treating Rawlins Stuard/Extender:Stone III, [name], FRED Weeks in Treatment: 108 Wound Status Wound Number: 61 Primary Venous Leg Ulcer Etiology: Wound Location: Left Lower Leg - Circumferential Wound Open Wounding Event: Gradually Appeared Status: Date Acquired: 04/20/2019 Comorbid Asthma, Hypertension, Peripheral Arterial Weeks Of Treatment: 19 History: Disease, Peripheral Venous Disease, Type II Clustered Wound: No Diabetes, Gout, Osteoarthritis Photos Photo Uploaded By: Mikeal Hawthorne on 09/01/2019 11:32:38 Wound Measurements Length: (cm) 19 % Reduct Width: (cm) 30.9 % Reduct Depth: (cm) 0.2 Epitheli Area: (cm) 461.107 Tunneli Volume: (cm) 92.221 Undermi Wound Description Classification: Full Thickness Without Exposed Support Foul Odo Structures Slough/F Wound Flat and Intact Margin: Exudate Large Amount: Exudate Serous Type: Exudate amber Color: Wound Bed Granulation Amount: None Present (0%) Necrotic Amount: Large (67-100%) Fascia E Necrotic Quality: Adherent Slough Fat Layer ( Tendon Expo Muscle Expo Joint Expos Bone Expose r After Cleansing: No ibrino Yes Exposed Structure xposed: No Subcutaneous Tissue) Exposed: Yes sed: No sed: No ed: No  d: No ion in Area: -6015.5% ion in Volume: -12130.9% alization: None ng:  No ning: No Electronic Signature(s) Signed: 08/31/2019 4:31:34 PM By: Deon Pilling Entered By: Deon Pilling on 08/31/2019 11:37:15 -------------------------------------------------------------------------------- Wound Assessment Details Patient Name: Date of Service: [name], STIVERS 08/31/2019 11:00 AM Medical Record [id number] Patient Account Number: [id number] Date of Birth/Sex: Treating RN: 12-13-1950 (69 y.o. [name] Primary Care Izsak Meir: [name] Other Clinician: Referring Sharel Behne: Treating Ravindra Baranek/Extender:Stone III, [name], FRED Weeks in Treatment: 108 Wound Status Wound Number: 64 Primary Diabetic Wound/Ulcer of the Lower Extremity Etiology: Wound Location: Left Foot - Dorsal Wound Open Wounding Event: Gradually Appeared Status: Date Acquired: 06/01/2019 Comorbid Asthma, Hypertension, Peripheral Arterial Weeks Of Treatment: 13 History: Disease, Peripheral Venous Disease, Type II Clustered Wound: No Diabetes, Gout, Osteoarthritis Photos Photo Uploaded By: Mikeal Hawthorne on 09/01/2019 11:32:38 Wound Measurements Length: (cm) 7.5 % Reduction Width: (cm) 7 % Reduction Depth: (cm) 0.1 Epithelializ Area: (cm) 41.233 Tunneling: Volume: (cm) 4.123 Undermining Wound Description Classification: Grade 1 Wound Margin: Flat and Intact Exudate Amount: Small Exudate Type: Serous Exudate Color: amber Wound Bed Granulation Amount: Medium (34-66%) Granulation Quality: Pink, Pale Necrotic Amount: Medium (34-66%) Necrotic Quality: Adherent Slough Foul Odor After Cleansing: No Slough/Fibrino Yes Exposed Structure Fascia Exposed: No Fat Layer (Subcutaneous Tissue) Exposed: Yes Tendon Exposed: No Muscle Exposed: No Joint Exposed: No Bone Exposed: No in Area: -24889.7% in Volume: -8314.3% ation: Small (1-33%) No : No Electronic Signature(s) Signed: 08/31/2019 4:31:34 PM By: Deon Pilling Entered By: Deon Pilling on 08/31/2019  11:38:10 -------------------------------------------------------------------------------- Vitals Details Patient Name: Date of Service: [name]. 08/31/2019 11:00 AM Medical Record [id number] Patient Account Number: [id number] Date of Birth/Sex: Treating RN: [birth year] (69 y.o. [name] Primary Care Omar Orrego: [name] Other Clinician: Referring Demitri Kucinski: Treating Namon Villarin/Extender:Stone III, [name], FRED Weeks in Treatment: 108 Vital Signs Time Taken: 11:27 Temperature (F): 98.6 Height (in): 62 Pulse (bpm): 85 Weight (lbs): 335 Respiratory Rate (breaths/min): 18 Body Mass Index (BMI): 61.3 Blood Pressure (mmHg): 131/65 Reference Range: 80 - 120 mg / dl Electronic Signature(s) Signed: 09/28/2019 9:21:08 AM By: Sandre Kitty Entered By: Sandre Kitty on 08/31/2019 11:29:41

## 2019-09-28 NOTE — Progress Notes (Signed)
LATONIA, CONROW ([id number]) Visit Report for 08/03/2019 Arrival Information Details Patient Name: Date of Service: ELLESSE, ANTENUCCI 08/03/2019 8:30 AM Medical Record [id number] Patient Account Number: [id number] Date of Birth/Sex: Treating RN: 05/08/1951 (69 y.o. [name] Primary Care Nakayla Rorabaugh: [name] Other Clinician: Referring Tiauna Whisnant: Treating Austen Wygant/Extender:Stone III, [name], FRED Weeks in Treatment: 104 Visit Information History Since Last Visit Added or deleted any medications: No Patient Arrived: Wheel Chair Any new allergies or adverse reactions: No Arrival Time: 08:19 Had a fall or experienced change in No activities of daily living that may affect Accompanied By: self risk of falls: Transfer Assistance: None Signs or symptoms of abuse/neglect since last No Patient Identification Verified: Yes visito Secondary Verification Process Yes Hospitalized since last visit: No Completed: Implantable device outside of the clinic excluding No Patient Requires Transmission-Based No cellular tissue based products placed in the center Precautions: since last visit: Patient Has Alerts: Yes Has Dressing in Place as Prescribed: Yes Patient Alerts: R ABI= Pain Present Now: Yes 1.01 L ABI = .99 Electronic Signature(s) Signed: 09/28/2019 9:23:26 AM By: Sandre Kitty Entered By: Sandre Kitty on 08/03/2019 08:21:35 -------------------------------------------------------------------------------- Compression Therapy Details Patient Name: Date of Service: [name] 08/03/2019 8:30 AM Medical Record [id number] Patient Account Number: [id number] Date of Birth/Sex: Treating RN: 1951/04/01 (69 y.o. [name] Primary Care Lucindia Lemley: [name] Other Clinician: Referring Norberto Wishon: Treating Narda Fundora/Extender:Stone III, [name], FRED Weeks in Treatment: 104 Compression Therapy Performed for Wound Wound #61  Left,Circumferential Lower Leg Assessment: Performed By: Clinician Carlene Coria, RN Compression Type: Three Layer Post Procedure Diagnosis Same as Pre-procedure Electronic Signature(s) Signed: 08/05/2019 6:14:01 PM By: [name] RN, BSN Entered By: [name] on 08/03/2019 09:07:05 -------------------------------------------------------------------------------- Compression Therapy Details Patient Name: Date of Service: [name] 08/03/2019 8:30 AM Medical Record [id number] Patient Account Number: [id number] Date of Birth/Sex: Treating RN: July 30, 1950 (69 y.o. [name] Primary Care Jaycub Noorani: [name] Other Clinician: Referring Jayquan Bradsher: Treating Chantay Whitelock/Extender:Stone III, [name], FRED Weeks in Treatment: 104 Compression Therapy Performed for Wound Wound #63 Right,Medial Malleolus Assessment: Performed By: Jake Church, RN Compression Type: Three Layer Post Procedure Diagnosis Same as Pre-procedure Electronic Signature(s) Signed: 08/05/2019 6:14:01 PM By: [name] RN, BSN Entered By: [name] on 08/03/2019 09:07:05 -------------------------------------------------------------------------------- Encounter Discharge Information Details Patient Name: Date of Service: [name] 08/03/2019 8:30 AM Medical Record [id number] Patient Account Number: [id number] Date of Birth/Sex: Treating RN: 10-08-1950 (69 y.o. [name] Primary Care Kynlei Piontek: [name] Other Clinician: Referring Amilliana Hayworth: Treating Yazir Koerber/Extender:Stone III, [name], FRED Weeks in Treatment: [phone] Encounter Discharge Information Items Discharge Condition: Stable Ambulatory Status: Wheelchair Discharge Destination: Home Transportation: Private Auto Accompanied By: self Schedule Follow-up Appointment: Yes Clinical Summary of Care: Patient Declined Electronic Signature(s) Signed: 08/03/2019 5:45:19 PM By: Carlene Coria  RN Entered By: Carlene Coria on 08/03/2019 09:48:46 -------------------------------------------------------------------------------- Lower Extremity Assessment Details Patient Name: Date of Service: MADASYN, HEATH 08/03/2019 8:30 AM Medical Record [id number] Patient Account Number: [id number] Date of Birth/Sex: Treating RN: 07-03-1950 (70 y.o. [name] Primary Care Nahdia Doucet: [name] Other Clinician: Referring Docia Klar: Treating Laylani Pudwill/Extender:Stone III, [name], FRED Weeks in Treatment: 104 Edema Assessment Assessed: [Left: No] [Right: No] Edema: [Left: Yes] [Right: Yes] Calf Left: Right: Point of Measurement: 37 cm From Medial Instep 43 cm 37 cm Ankle Left: Right: Point of Measurement: 10 cm From Medial Instep 32 cm 25 cm Electronic Signature(s) Signed: 08/03/2019 5:44:15 PM By: Deon Pilling Entered By: Deon Pilling on 08/03/2019  08:46:45 -------------------------------------------------------------------------------- Multi-Disciplinary Care Plan Details Patient Name: Date of Service: LEZLI, DANEK 08/03/2019 8:30 AM Medical Record [id number] Patient Account Number: [id number] Date of Birth/Sex: Treating RN: [birth year] (69 y.o. [name] Primary Care Linnae Rasool: [name] Other Clinician: Referring Kiyo Heal: Treating Mija Effertz/Extender:Stone III, [name], FRED Weeks in Treatment: [phone] Active Inactive Venous Leg Ulcer Nursing Diagnoses: Actual venous Insuffiency (use after diagnosis is confirmed) Knowledge deficit related to disease process and management Goals: Patient will maintain optimal edema control Date Initiated: 08/12/2017 Target Resolution Date: 08/24/2019 Goal Status: Active Patient/caregiver will verbalize understanding of disease process and disease management Date Initiated: 08/12/2017 Date Inactivated: 04/21/2018 Target Resolution Date: 04/24/2018 Goal Status: Met Interventions: Assess peripheral edema status  every visit. Compression as ordered Treatment Activities: Therapeutic compression applied : 08/12/2017 Notes: Wound/Skin Impairment Nursing Diagnoses: Impaired tissue integrity Knowledge deficit related to ulceration/compromised skin integrity Goals: Patient/caregiver will verbalize understanding of skin care regimen Date Initiated: 08/12/2017 Target Resolution Date: 08/24/2019 Goal Status: Active Ulcer/skin breakdown will have a volume reduction of 30% by week 4 Date Initiated: 08/05/2017 Date Inactivated: 09/30/2017 Target Resolution Date: 10/03/2017 Goal Status: Met Ulcer/skin breakdown will have a volume reduction of 50% by week 8 Date Initiated: 09/30/2017 Date Inactivated: 10/28/2017 Target Resolution Date: 10/28/2017 Goal Status: Met Interventions: Assess patient/caregiver ability to perform ulcer/skin care regimen upon admission and as needed Assess ulceration(s) every visit Provide education on ulcer and skin care Screen for HBO Treatment Activities: Patient referred to home care : 08/05/2017 Skin care regimen initiated : 08/05/2017 Topical wound management initiated : 08/05/2017 Notes: Electronic Signature(s) Signed: 08/05/2019 6:14:01 PM By: [name] RN, BSN Entered By: [name] on 08/03/2019 08:55:39 -------------------------------------------------------------------------------- Pain Assessment Details Patient Name: Date of Service: [name] 08/03/2019 8:30 AM Medical Record [id number] Patient Account Number: [id number] Date of Birth/Sex: Treating RN: [birth year] (69 y.o. [name] Primary Care Charlean Carneal: [name] Other Clinician: Referring Shanik Brookshire: Treating Fredy Gladu/Extender:Stone III, [name], FRED Weeks in Treatment: 104 Active Problems Location of Pain Severity and Description of Pain Patient Has Paino Yes Site Locations Rate the pain. Current Pain Level: 4 Pain Management and Medication Current Pain  Management: Electronic Signature(s) Signed: 08/05/2019 6:14:01 PM By: [name] RN, BSN Signed: 09/28/2019 9:23:26 AM By: Sandre Kitty Entered By: Sandre Kitty on 08/03/2019 08:22:23 -------------------------------------------------------------------------------- Patient/Caregiver Education Details Patient Name: Date of Service: [name] 2/17/2021andnbsp8:30 AM Medical Record Patient Account Number: [id number] Number: Treating RN: [name] Date of Birth/Gender: 1950/09/22 (69 y.o. Other Clinician: F) Treating [name] Primary Care Physician: [name] Physician/Extender: Referring Physician: Wilhelmenia Blase in Treatment: 104 Education Assessment Education Provided To: Patient Education Topics Provided Venous: Methods: Explain/Verbal Responses: Reinforcements needed, State content correctly Wound/Skin Impairment: Methods: Explain/Verbal Responses: Reinforcements needed, State content correctly Electronic Signature(s) Signed: 08/05/2019 6:14:01 PM By: [name] RN, BSN Entered By: [name] on 08/03/2019 09:06:27 -------------------------------------------------------------------------------- Wound Assessment Details Patient Name: Date of Service: [name] 08/03/2019 8:30 AM Medical Record [id number] Patient Account Number: [id number] Date of Birth/Sex: Treating RN: 1950/11/15 (69 y.o. [name] Primary Care Ileane Sando: [name] Other Clinician: Referring Brahim Dolman: Treating Hayzlee Mcsorley/Extender:Stone III, [name], FRED Weeks in Treatment: 104 Wound Status Wound Number: 61 Primary Venous Leg Ulcer Etiology: Wound Location: Left Lower Leg - Circumferential Wound Open Wounding Event: Gradually Appeared Status: Date Acquired: 04/20/2019 Comorbid Asthma, Hypertension, Peripheral Arterial Weeks Of Treatment: 15 History: Disease, Peripheral Venous Disease, Type II Clustered Wound:  No Diabetes, Gout, Osteoarthritis Photos Wound Measurements Length: (  cm) 18.7 % Reductio Width: (cm) 27.5 % Reductio Depth: (cm) 0.1 Epithelial Area: (cm) 403.891 Tunneling Volume: (cm) 40.389 Undermini Wound Description Classification: Full Thickness Without Exposed Support Foul Odo Structures Slough/F Wound Flat and Intact Margin: Exudate Large Amount: Exudate Purulent Type: Exudate yellow, brown, green Color: Wound Bed Granulation Amount: None Present (0%) Necrotic Amount: Large (67-100%) Fascia E Necrotic Quality: Adherent Slough Fat Laye Tendon E Muscle E Joint Ex Bone Exp r After Cleansing: No ibrino Yes Exposed Structure xposed: No r (Subcutaneous Tissue) Exposed: Yes xposed: No xposed: No posed: No osed: No n in Area: -5256.6% n in Volume: -5256.6% ization: None : No ng: No Assessment Notes macerated left leg. Electronic Signature(s) Signed: 08/03/2019 4:27:12 PM By: Mikeal Hawthorne EMT/HBOT Signed: 08/05/2019 6:14:01 PM By: [name] RN, BSN Entered By: Mikeal Hawthorne on 08/03/2019 14:23:03 -------------------------------------------------------------------------------- Wound Assessment Details Patient Name: Date of Service: [name] 08/03/2019 8:30 AM Medical Record [id number] Patient Account Number: [id number] Date of Birth/Sex: Treating RN: 1950-07-08 (69 y.o. [name] Primary Care Shivank Pinedo: [name] Other Clinician: Referring Serinity Ware: Treating Allura Doepke/Extender:Stone III, [name], FRED Weeks in Treatment: 104 Wound Status Wound Number: 63 Primary Venous Leg Ulcer Etiology: Wound Location: Right Malleolus - Medial Wound Open Wounding Event: Gradually Appeared Status: Date Acquired: 05/17/2019 Comorbid Asthma, Hypertension, Peripheral Arterial Weeks Of Treatment: 9 History: Disease, Peripheral Venous Disease, Type II Clustered Wound: No Diabetes, Gout, Osteoarthritis Photos Wound  Measurements Length: (cm) 1.4 % Reduct Width: (cm) 2.5 % Reduct Depth: (cm) 0.1 Epitheli Area: (cm) 2.749 Tunneli Volume: (cm) 0.275 Undermi Wound Description Classification: Full Thickness Without Exposed Support Foul Od Structures Slough/ Wound Well defined, not attached Margin: Exudate Medium Amount: Exudate Serosanguineous Type: Exudate red, brown Color: Wound Bed Granulation Amount: Large (67-100%) Granulation Quality: Pale Fascia E Necrotic Amount: Small (1-33%) Fat Laye Necrotic Quality: Adherent Slough Tendon E Muscle E Joint Ex Bone Exp or After Cleansing: No Fibrino Yes Exposed Structure xposed: No r (Subcutaneous Tissue) Exposed: Yes xposed: No xposed: No posed: No osed: No ion in Area: -68.2% ion in Volume: -68.7% alization: Large (67-100%) ng: No ning: No Electronic Signature(s) Signed: 08/03/2019 4:27:12 PM By: Mikeal Hawthorne EMT/HBOT Signed: 08/05/2019 6:14:01 PM By: [name] RN, BSN Entered By: Mikeal Hawthorne on 08/03/2019 13:46:02 -------------------------------------------------------------------------------- Wound Assessment Details Patient Name: Date of Service: [name] 08/03/2019 8:30 AM Medical Record [id number] Patient Account Number: [id number] Date of Birth/Sex: Treating RN: 1950-09-22 (69 y.o. [name] Primary Care Payslee Bateson: [name] Other Clinician: Referring Eddie Payette: Treating Jolea Dolle/Extender:Stone III, [name], FRED Weeks in Treatment: 104 Wound Status Wound Number: 64 Primary Diabetic Wound/Ulcer of the Lower Extremity Etiology: Wound Location: Left Foot - Dorsal Wound Open Wounding Event: Gradually Appeared Status: Date Acquired: 06/01/2019 Comorbid Asthma, Hypertension, Peripheral Arterial Weeks Of Treatment: 9 History: Disease, Peripheral Venous Disease, Type II Clustered Wound: No Diabetes, Gout, Osteoarthritis Photos Wound Measurements Length: (cm) 10 Width: (cm)  8.4 Depth: (cm) 0.1 Area: (cm) 65.973 Volume: (cm) 6.597 Wound Description Classification: Grade 1 Wound Margin: Flat and Intact Exudate Amount: Medium Exudate Type: Purulent Exudate Color: yellow, brown, green Wound Bed Granulation Amount: None Present (0%) Necrotic Amount: Large (67-100%) Necrotic Quality: Adherent Slough ter Cleansing: No no Yes Exposed Structure ed: No ubcutaneous Tissue) Exposed: Yes ed: No ed: No d: No : No % Reduction in Area: -39883.6% % Reduction in Volume: -13363.3% Epithelialization: None Tunneling: No Undermining: No Foul Odor Af Slough/Fibri Fascia Expos Fat Layer (S Tendon Expos Muscle Expos Joint  Expose Bone Exposed Assessment Notes macerated left foot. Electronic Signature(s) Signed: 08/03/2019 4:27:12 PM By: Mikeal Hawthorne EMT/HBOT Signed: 08/05/2019 6:14:01 PM By: [name] RN, BSN Entered By: Mikeal Hawthorne on 08/03/2019 14:22:27 -------------------------------------------------------------------------------- Vitals Details Patient Name: Date of Service: [name] 08/03/2019 8:30 AM Medical Record [id number] Patient Account Number: [id number] Date of Birth/Sex: Treating RN: September 06, 1950 (69 y.o. [name] Primary Care Matai Carpenito: [name] Other Clinician: Referring Aeris Hersman: Treating Kia Stavros/Extender:Stone III, [name], FRED Weeks in Treatment: 104 Vital Signs Time Taken: 08:21 Temperature (F): 98.5 Height (in): 62 Pulse (bpm): 103 Weight (lbs): 335 Respiratory Rate (breaths/min): 18 Body Mass Index (BMI): 61.3 Blood Pressure (mmHg): 142/76 Reference Range: 80 - 120 mg / dl Electronic Signature(s) Signed: 09/28/2019 9:23:26 AM By: Sandre Kitty Entered By: Sandre Kitty on 08/03/2019 08:21:54

## 2019-09-28 NOTE — Progress Notes (Signed)
[name], [name] ([id number]) Visit Report for 09/07/2019 Arrival Information Details Patient Name: Date of Service: [name], [name] 09/07/2019 9:30 AM Medical Record [id number] Patient Account Number: [id number] Date of Birth/Sex: Treating RN: 1950-08-18 (69 y.o. [name] Primary Care Anissia Wessells: [name] Other Clinician: Referring Irl Bodie: Treating Kazaria Gaertner/Extender:Stone III, [name], FRED Weeks in Treatment: 109 Visit Information History Since Last Visit Added or deleted any medications: No Patient Arrived: Wheel Chair Any new allergies or adverse reactions: No Arrival Time: 09:24 Had a fall or experienced change in No activities of daily living that may affect Accompanied By: self risk of falls: Transfer Assistance: None Signs or symptoms of abuse/neglect since last No Patient Identification Verified: Yes visito Secondary Verification Process Yes Hospitalized since last visit: No Completed: Implantable device outside of the clinic excluding No Patient Requires Transmission-Based No cellular tissue based products placed in the center Precautions: since last visit: Patient Has Alerts: Yes Has Dressing in Place as Prescribed: Yes Patient Alerts: R ABI= Pain Present Now: No 1.01 L ABI = .99 Electronic Signature(s) Signed: 09/28/2019 9:21:08 AM By: Sandre Kitty Entered By: Sandre Kitty on 09/07/2019 09:24:51 -------------------------------------------------------------------------------- Compression Therapy Details Patient Name: Date of Service: [name] 09/07/2019 9:30 AM Medical Record [id number] Patient Account Number: [id number] Date of Birth/Sex: Treating RN: [birth year] (69 y.o. [name] Primary Care Zabian Swayne: [name] Other Clinician: Referring Arabella Revelle: Treating Treylin Burtch/Extender:Stone III, [name], FRED Weeks in Treatment: 109 Compression Therapy Performed for Wound Wound #61  Left,Circumferential Lower Leg Assessment: Performed By: Clinician Carlene Coria, RN Compression Type: Three Layer Post Procedure Diagnosis Same as Pre-procedure Electronic Signature(s) Signed: 09/07/2019 5:36:50 PM By: [name] RN, BSN Entered By: [name] on 09/07/2019 10:37:59 -------------------------------------------------------------------------------- Encounter Discharge Information Details Patient Name: Date of Service: [name]. 09/07/2019 9:30 AM Medical Record [id number] Patient Account Number: [id number] Date of Birth/Sex: Treating RN: 01-28-1951 (68 y.o. [name] Primary Care Auriella Wieand: [name] Other Clinician: Referring Sheliah Fiorillo: Treating Favour Aleshire/Extender:Stone III, [name], FRED Weeks in Treatment: [id number] Encounter Discharge Information Items Post Procedure Vitals Discharge Condition: Stable Temperature (F): 98.2 Ambulatory Status: Wheelchair Pulse (bpm): 92 Discharge Destination: Home Respiratory Rate (breaths/min): 18 Transportation: Private Auto Blood Pressure (mmHg): 130/71 Accompanied By: self Schedule Follow-up Appointment: Yes Clinical Summary of Care: Patient Declined Electronic Signature(s) Signed: 09/07/2019 5:15:50 PM By: Carlene Coria RN Entered By: Carlene Coria on 09/07/2019 11:01:04 -------------------------------------------------------------------------------- Lower Extremity Assessment Details Patient Name: Date of Service: [name], [name] 09/07/2019 9:30 AM Medical Record [id number] Patient Account Number: [id number] Date of Birth/Sex: Treating RN: 01/08/1951 (69 y.o. [name] Primary Care [place] Lapaglia: [name] Other Clinician: Referring Nichalas Coin: Treating [name] [name]/Extender:Stone III, [name], FRED Weeks in Treatment: 109 Edema Assessment Assessed: [Left: Yes] [Right: No] Edema: [Left: Yes] [Right: Yes] Calf Left: Right: Point of Measurement: 37 cm From Medial Instep 39 cm  cm Ankle Left: Right: Point of Measurement: 10 cm From Medial Instep 21 cm cm Electronic Signature(s) Signed: 09/07/2019 5:22:14 PM By: Deon Pilling Entered By: Deon Pilling on 09/07/2019 09:38:40 -------------------------------------------------------------------------------- [place] Details Patient Name: Date of Service: [name]. 09/07/2019 9:30 AM Medical Record [id number] Patient Account Number: [id number] Date of Birth/Sex: Treating RN: [birth year] (69 y.o. [name] Primary Care Charlissa Petros: [name] Other Clinician: Referring Omni Dunsworth: Treating Jamerson Vonbargen/Extender:Stone III, [name], FRED Weeks in Treatment: 109 Active Inactive Venous Leg Ulcer Nursing Diagnoses: Actual venous Insuffiency (use after diagnosis is confirmed) Knowledge deficit related to disease process and management Goals: Patient will maintain  optimal edema control Date Initiated: 08/12/2017 Target Resolution Date: 09/21/2019 Goal Status: Active Patient/caregiver will verbalize understanding of disease process and disease management Date Initiated: 08/12/2017 Date Inactivated: 04/21/2018 Target Resolution Date: 04/24/2018 Goal Status: Met Interventions: Assess peripheral edema status every visit. Compression as ordered Treatment Activities: Therapeutic compression applied : 08/12/2017 Notes: Wound/Skin Impairment Nursing Diagnoses: Impaired tissue integrity Knowledge deficit related to ulceration/compromised skin integrity Goals: Patient/caregiver will verbalize understanding of skin care regimen Date Initiated: 08/12/2017 Target Resolution Date: 09/21/2019 Goal Status: Active Ulcer/skin breakdown will have a volume reduction of 30% by week 4 Date Initiated: 08/05/2017 Date Inactivated: 09/30/2017 Target Resolution Date: 10/03/2017 Goal Status: Met Ulcer/skin breakdown will have a volume reduction of 50% by week 8 Date Initiated: 09/30/2017 Date Inactivated:  10/28/2017 Target Resolution Date: 10/28/2017 Goal Status: Met Interventions: Assess patient/caregiver ability to perform ulcer/skin care regimen upon admission and as needed Assess ulceration(s) every visit Provide education on ulcer and skin care Screen for HBO Treatment Activities: Patient referred to home care : 08/05/2017 Skin care regimen initiated : 08/05/2017 Topical wound management initiated : 08/05/2017 Notes: Electronic Signature(s) Signed: 09/07/2019 5:36:50 PM By: [name] RN, BSN Entered By: [name] on 09/07/2019 10:29:55 -------------------------------------------------------------------------------- Pain Assessment Details Patient Name: Date of Service: [name]. 09/07/2019 9:30 AM Medical Record [id number] Patient Account Number: [id number] Date of Birth/Sex: Treating RN: [birth year] (69 y.o. [name] Primary Care Bari Leib: [name] Other Clinician: Referring Tijana Walder: Treating Sejla Marzano/Extender:Stone III, [name], FRED Weeks in Treatment: 109 Active Problems Location of Pain Severity and Description of Pain Patient Has Paino No Site Locations Pain Management and Medication Current Pain Management: Electronic Signature(s) Signed: 09/07/2019 5:36:50 PM By: [name] RN, BSN Signed: 09/28/2019 9:21:08 AM By: Sandre Kitty Entered By: Sandre Kitty on 09/07/2019 09:27:00 -------------------------------------------------------------------------------- Patient/Caregiver Education Details Patient Name: Date of Service: [name] 3/24/2021andnbsp9:30 AM Medical Record Patient Account Number: [id number] Number: Treating RN: [name] Date of Birth/Gender: [birth year] (69 y.o. Other Clinician: F) Treating [name] Primary Care Physician: [name] Physician/Extender: Referring Physician: Wilhelmenia Blase in Treatment: 109 Education Assessment Education Provided  To: Patient Education Topics Provided Venous: Methods: Explain/Verbal Responses: Reinforcements needed, State content correctly Wound/Skin Impairment: Methods: Explain/Verbal Responses: Reinforcements needed, State content correctly Electronic Signature(s) Signed: 09/07/2019 5:36:50 PM By: [name] RN, BSN Entered By: [name] on 09/07/2019 10:31:06 -------------------------------------------------------------------------------- Wound Assessment Details Patient Name: Date of Service: [name]. 09/07/2019 9:30 AM Medical Record [id number] Patient Account Number: [id number] Date of Birth/Sex: Treating RN: 01-15-1951 (69 y.o. [name] Primary Care Kaesen Rodriguez: [name] Other Clinician: Referring Dent Plantz: Treating Lynleigh Kovack/Extender:Stone III, [name], FRED Weeks in Treatment: 109 Wound Status Wound Number: 61 Primary Venous Leg Ulcer Etiology: Wound Location: Left Lower Leg - Circumferential Wound Open Wounding Event: Gradually Appeared Status: Date Acquired: 04/20/2019 Comorbid Asthma, Hypertension, Peripheral Arterial Weeks Of Treatment: 20 History: Disease, Peripheral Venous Disease, Type II Clustered Wound: No Diabetes, Gout, Osteoarthritis Photos Photo Uploaded By: Mikeal Hawthorne on 09/08/2019 14:46:03 Wound Measurements Length: (cm) 18 % Reduct Width: (cm) 28 % Reduct Depth: (cm) 0.2 Epitheli Area: (cm) 395.841 Tunneli Volume: (cm) 79.168 Undermi Wound Description Full Thickness Without Exposed Support Foul Odo Classification: Structures Slough/F Wound Flat and Intact Margin: Exudate Large Amount: Exudate Serous Type: Exudate amber Color: Wound Bed Granulation Amount: None Present (0%) Necrotic Amount: Large (67-100%) Fascia E Necrotic Quality: Adherent Slough Fat Laye Tendon Expo Muscle Expo Joint Expos Bone Expose r After Cleansing: No ibrino Yes Exposed  Structure xposed: No r (Subcutaneous Tissue)  Exposed: Yes sed: No sed: No ed: No d: No ion in Area: -5149.9% ion in Volume: -10399.7% alization: Small (1-33%) ng: No ning: No Electronic Signature(s) Signed: 09/07/2019 5:22:14 PM By: Deon Pilling Signed: 09/07/2019 5:36:50 PM By: [name] RN, BSN Entered By: Deon Pilling on 09/07/2019 09:38:56 -------------------------------------------------------------------------------- Wound Assessment Details Patient Name: Date of Service: [name]. 09/07/2019 9:30 AM Medical Record [id number] Patient Account Number: [id number] Date of Birth/Sex: Treating RN: 12-May-1951 (69 y.o. [name] Primary Care Treyvone Chelf: [name] Other Clinician: Referring Chayna Surratt: Treating Thailyn Khalid/Extender:Stone III, [name], FRED Weeks in Treatment: 109 Wound Status Wound Number: 64 Primary Diabetic Wound/Ulcer of the Lower Extremity Etiology: Wound Location: Left Foot - Dorsal Wound Open Wounding Event: Gradually Appeared Status: Date Acquired: 06/01/2019 Comorbid Asthma, Hypertension, Peripheral Arterial Weeks Of Treatment: 14 History: Disease, Peripheral Venous Disease, Type II Clustered Wound: No Diabetes, Gout, Osteoarthritis Photos Photo Uploaded By: Mikeal Hawthorne on 09/08/2019 14:45:40 Wound Measurements Length: (cm) 7.5 % Reduction Width: (cm) 7 % Reduction Depth: (cm) 0.1 Epithelializ Area: (cm) 41.233 Tunneling: Volume: (cm) 4.123 Undermining Wound Description Classification: Grade 1 Wound Margin: Flat and Intact Exudate Amount: Small Exudate Type: Serous Exudate Color: amber Wound Bed Granulation Amount: Small (1-33%) Granulation Quality: Pink, Pale Necrotic Amount: Large (67-100%) Necrotic Quality: Adherent Slough Foul Odor After Cleansing: No Slough/Fibrino Yes Exposed Structure Fascia Exposed: No Fat Layer (Subcutaneous Tissue) Exposed: Yes Tendon Exposed: No Muscle Exposed: No Joint Exposed: No Bone Exposed: No in Area:  -24889.7% in Volume: -8314.3% ation: Small (1-33%) No : No Electronic Signature(s) Signed: 09/07/2019 5:22:14 PM By: Deon Pilling Signed: 09/07/2019 5:36:50 PM By: [name] RN, BSN Entered By: Deon Pilling on 09/07/2019 09:39:26 -------------------------------------------------------------------------------- Vitals Details Patient Name: Date of Service: [name]. 09/07/2019 9:30 AM Medical Record [id number] Patient Account Number: [id number] Date of Birth/Sex: Treating RN: 08/26/1950 (69 y.o. [name] Primary Care Female Minish: [name] Other Clinician: Referring Liborio Saccente: Treating Ladine Kiper/Extender:Stone III, [name], FRED Weeks in Treatment: 109 Vital Signs Time Taken: 09:24 Temperature (F): 98.2 Height (in): 62 Pulse (bpm): 92 Weight (lbs): 335 Respiratory Rate (breaths/min): 18 Body Mass Index (BMI): 61.3 Blood Pressure (mmHg): 130/71 Reference Range: 80 - 120 mg / dl Electronic Signature(s) Signed: 09/28/2019 9:21:08 AM By: Sandre Kitty Entered By: Sandre Kitty on 09/07/2019 09:26:56

## 2019-10-12 ENCOUNTER — Encounter (HOSPITAL_BASED_OUTPATIENT_CLINIC_OR_DEPARTMENT_OTHER): Payer: Medicare PPO | Admitting: Physician Assistant

## 2019-10-12 NOTE — Progress Notes (Addendum)
[name], [name] ([id number]) Visit Report for 10/12/2019 Chief Complaint Document Details Patient Name: Date of Service: [name], [name] 10/12/2019 10:30 A M Medical Record Number: [id number] Patient Account Number: [id number] Date of Birth/Sex: Treating RN: [birth year] (69 y.o. [name] Primary Care Provider: [name] Other Clinician: Referring Provider: Treating Provider/Extender: Doyle Askew, FRED Weeks in Treatment: [place] from: Patient Chief Complaint Bilateral reoccurring LE ulcers Electronic Signature(s) Signed: 10/12/2019 10:32:10 AM By: [name] PA-C Entered By: [name] on 10/12/2019 10:32:09 -------------------------------------------------------------------------------- HPI Details Patient Name: Date of Service: [name]. 10/12/2019 10:30 A M Medical Record Number: [id number] Patient Account Number: [id number] Date of Birth/Sex: Treating RN: 04-28-1951 (69 y.o. [name] Primary Care Provider: [name] Other Clinician: Referring Provider: Treating Provider/Extender: Doyle Askew, FRED Weeks in Treatment: 114 History of Present Illness HPI Description: this patient has been seen a couple of times before and returns with recurrent problems to her right and left lower extremity with swelling and weeping ulcerations due to not wearing her compression stockings which she had been advised to do during her last discharge, at the end of June 2018. During her last visit the patient had had normal arterial blood flow and her venous reflux study did not necessitate any surgical intervention. She was recommended compression and elevation and wound care. After prolonged treatment the patient was completely healed but she has been noncompliant with wearing or compressions.. She was here last week with an outpatient return visit planned but the patient came in a very poor general condition with altered  mental status and was rushed to the ER on my request. With a history of hypertension, diabetes, TIA and right-sided weakness she was set up for an MRI on her brain and cervical spine and was sent to [facility]. Getting an MRI done was very difficult but once the workup was done she was found not to have any spinal stenosis, epidural abscess or hematoma or discitis. This was radiculopathy to be treated as an outpatient and she was given a follow-up appointment. Today she is feeling much better alert and oriented and has come to reevaluate her bilateral lower extremity lymphedema and ulceration 03/25/2017 -- she was admitted to the hospital on 03/16/2017 and discharged on 03/18/2017 with left leg cellulitis and ulceration. She was started on vancomycin and Zosyn and x-ray showed no bony involvement. She was treated for a cellulitis with IV antibiotics changed to Rocephin and Flagyl and was discharged on oral Keflex and doxycycline to complete a 7 day course. Last hemoglobin A1c was 7.1 and her other ailments including hypertension got asthma were appropriately treated. 05/06/2017 -- she is awaiting the right size of compression stockings from [place] but other than that has been doing well. ====== Old notes 69 year old patient was seen one time last October and was lost to follow-up. She has recurrent problems with weeping and ulceration of her left lower extremity and has swelling of this for several years. It has been worse for the last 2 months. Past medical history is significant for diabetes mellitus type 2, hypertension, gout, morbid obesity, depressive disorders, hiatal hernia, migraines, status post knee surgery, risk of a cholecystectomy, vaginal hysterectomy and breast biopsy. She is not a smoker. As noted before she has never had a venous duplex study and an arterial ABI study was attempted but the left lower extremity was noncompressible 10/01/2016 -- had a lower extremity venous duplex  reflux evaluation which showed  no evidence of deep vein reflux in the right or left lower extremity, and no evidence of great saphenous vein reflux more than 500 ms in the right or left lower extremity, and the left small saphenous vein is incompetent but no vascular consult was recommended. review of her electronic medical records noted that the ABI was checked in July 2017 where the right ABI was normal limits and the left ABI could not be ascertained due to pain with cuff pressure but the waveforms are within normal limits. her arterial duplex study scheduled for April 27. 10/08/2016 -- the patient has various reasons for not having a compression on and for the last 3 days she has had no compression on her left lower extremity either due to pain or the lack of nursing help. She does not use her juxta lites either. 10/15/2016 -- the patient did not keep her appointment for arterial duplex study on April 27 and I have asked her to reschedule this. Her pain is out of proportion with the physical findings and she continuously fails to wear a compression wraps and cuts them off because she says she cannot tolerate the pain. She does not use her juxta lites either. 10/22/2016 -- he has rescheduled her arterial duplex study to May 21 and her pain today is a bit better. She has not been wearing her juxta lites on her right lower extremity but now understands that she needs to do this. She did tolerate the to press compression wrap on her left lower extremity 10/29/2016 --arterial duplex study is scheduled for next week and overall she has been tolerating her compression wraps and also using her juxta lites on her right lower extremity 11/05/2016 -- the right ABI was 0.95 the left was 1.03. The digit TBI is on the right was 0.83 on the left was 0.92 and she had biphasic flow through these vessels. The impression was that of normal lower extremity arterial study. 11/12/2016 -- her pain is minimal and she  is doing very well overall. 11/26/2016 -- she has got juxta lites and her insurance will not pay for additional dual layer compression stockings. She is going to order some from [place]. 05/12/2017 -- her juxta lites are very old and too big for her and these have not been helping with compression. She did get 20-30 mm compression stockings from [place] but she and her husband are unable to put these on. I believe she will benefit from bilateral Extremit-ease, compression stockings and we will measure her for these today. 05/20/2017 -- lymphedema on the left lower extremity has increased a lot and she has a open ulceration as a result of this. The right lower extremity is looking pretty good. She has decided to by the compression stockings herself and will get reimbursed by the home health, at a later date. 05/27/2017 -- her sciatica is bothering her a lot and she thought her left leg pain was caused due to the compression wrap and hence removed it and has significant lymphedema. There is no inflammation on this left lower extremity. 06/17/17 on evaluation today patient appears to be doing very well and in fact is completely healed in regard to her ulcerations. Unfortunately however she does have continued issues with lymphedema nonetheless. We did order compression garments for her unfortunately she states that the size that she received were large although we ordered medium. Obviously this means she is not getting the optimal compression. She does not have those with her today and therefore  we could not confirm and contact the company on her behalf. Nonetheless she does state that she is going to have her husband bring them by tomorrow so that we can verify and then get in touch with the company. No fevers, chills, nausea, or vomiting noted at this time. Overall patient is doing better otherwise and I'm pleased with the progress she has made. 07/01/17 on evaluation today patient appears to be doing  very well in regard to her bilateral lower extremity she does not have any openings at this point which is excellent news. Overall I'm pleased with how things have progressed up to this time. Since she is doing so well we did order her compression which we are seeing her today to ensure that it fits her properly and everything is doing well in that regard and then subsequently she will be discharged. ============ Old Notes: 03/31/16 patient presents today for evaluation concerning open wounds that she has over the left medial ankle region as well as the left dorsal foot. She has previously had this occur although it has been healed for a number of months after having this for about a year prior until her hospitalization on 01/05/16. At that point in time it appears that she was admitted to the hospital for left lower extremity cellulitis and was placed on vancomycin and Zosyn at that point. Eventually upon discharge on January 15, 2016 she was placed on doxycycline at that point in time. Later on 03/27/16 positive wound culture growing Escherichia coli this was switched to amoxicillin. Currently she tells me that she is having pain radiated to be a 7 out of 10 which can be as high as 10 out of 10 with palpation and manipulation of the wound. This wound appears to be mainly venous in nature due to the bilateral lower extremity venous stasis/lymphedema. This is definitely much worse on her left than the right side. She does have type 1 diabetes mellitus, hypertension, morbid obesity, and is wheelchair dependent.during the course of the hospital stay a blood culture was also obtained and fortunately appeared negative. She also had an x-ray of the tibia/fibula on the left which showed no acute bone abnormality. Her white blood cell count which was performed last on 03/25/16 was 7.3, hemoglobin 12.8, protein 7.1, albumin 3.0. Her urine culture appeared to be negative for any specific organisms. Patient did  have a left lower extremity venous duplex evaluation for DVT . This did not include venous reflux studies but fortunately was negative for DVT Patient also had arterial studies performed which revealed that she had a . normal ABI on the right though this was unable to be performed on the left secondary to pain that she was having around the ankle region due to the wound. However it was stated on report that she had biphasic pulses and apparently good blood flow. ========== 06/03/17 she is here in follow-up evaluation for right lower extremity ulcer. The right lower sure he has healed but she has reopened to the left medial malleolus and dorsal foot with weeping. She is waiting for new compression garments to arrive from home health, the previous compression garments were ill fitting. We will continue with compression bilaterally and follow-up in 2 weeks Readmission: 08/05/17 on evaluation today patient appears to be doing somewhat poorly in regard to her left lower extremity especially although the right lower extremity has a small area which may no longer be open. She has been having a lot of drainage from the  left lower extremity however he tells me that she has not been able to use the EXTREMIT-EASE Compression at this point. She states that she did better and was able to actually apply the Juxta-Lite compression although the wound that she has is too large and therefore really does not compress which is why she cannot wear it at this point. She has no one who can help her put it on regular basis her son can sometimes but he's not able to do it most of the time. I do believe that's why she has begun to weave and have issues as she is currently yet again. No fevers, chills, nausea, or vomiting noted at this time. Patient is no evidence of dementia. 08/12/17 on evaluation today patient appears to still be doing fairly well in regard to the draining areas/weeping areas at this point. With that being  said she unfortunately did go to the ER yesterday due to what was felt to be possibly a cellulitis. They place her on doxycycline by mouth and discharge her home. She definitely was not admitted. With that being said she states she has had more discomfort which has been unusual for her even compared to prior times and she's had infections.08/12/17 on evaluation today patient appears to still be doing fairly well in regard to the draining areas/weeping areas at this point. With that being said she unfortunately did go to the ER yesterday due to what was felt to be possibly a cellulitis. They place her on doxycycline by mouth and discharge her home. She definitely was not admitted. With that being said she states she has had more discomfort which has been unusual for her even compared to prior times and she's had infections. 08/19/17 put evaluation today patient tells me that she's been having a lot of what sounds to be neuropathic type pain in regard to her left lower extremity. She has been using over-the-counter topical bins again which some believe. That in order to apply the she actually remove the wrap we put on her last Wednesday on Thursday. Subsequently she has not had anything on compression wise since that time. The good news is a lot of the weeping areas appear to have closed at this point again I believe she would do better with compression but we are struggling to get her to actually use what she needs to at this point. No fevers, chills, nausea, or vomiting noted at this time. 09/03/17 on evaluation today patient appears to be doing okay in regard to her lower extremities in regard to the lymphedema and weeping. Fortunately she does not seem to show any signs of infection at this point she does have a little bit of weeping occurring in the right medial malleolus area. With that being said this does not appear to be too significant which is good news. 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 She has severe skin damage secondary to both of these features involving the dorsal left foot and medial left ankle and lower leg. Still has open areas in the left anterior foot. The area on the right closed over. She uses her own juxta light stockings. She does not have an arterial issue 09/16/17 on evaluation today patient actually appears to be doing excellent in regard to her bilateral lower extremity swelling. The Juxta-Lite compression wrap seem to be doing very well for her. She has not however been using the portion that goes over her foot.  Her left foot still is draining a little bit not nearly as significant as it has been in the past but still I do believe that she likely needs to utilize the full wrap including the foot portion of this will improve as well. She also has been apparently putting on a significant amount of Vaseline which also think is not helpful for her. I recommended that if she feels she needs something for moisturizer Eucerin will probably be better. 09/30/17 on evaluation today patient presents with several new open areas in regard to her left lower extremity although these appear to be minimal and mainly seem to be more moisture breakdown than anything. Fortunately she does not seem to have any evidence of infection which is great news. She has been tolerating the dressing changes without complication we are using silver alginate on the foot she has been using AB pads to have the legs and using her Juxta- Lite compression which seems to be controlling her swelling very well. Overall I'm pleased with the poor way she has progressed. 10/14/17 on evaluation today patient appears to be doing better in regard to her left lower extremity areas of weeping. She does still have some discomfort although in general this does not appear to be as macerated and I think it is progressing nicely. I do think she still  needs to wear the foot portion of her Juxta- Lite in order to get the most benefit from the wrap obviously. She states she understands. Fortunately there does not appear to be evidence of infection at this time which is great news. 10/28/17 on evaluation today patient appears to be doing excellent in regard to her left lower extremity. She has just a couple areas that are still open and seem to be causing any trouble whatsoever. For that reason I think that she is definitely headed in the right direction the spots are very tiny compared to what we have been dealing with in the past. 11/11/17 on evaluation today patient appears to have a right lateral lower extremity ulcer that has opened since I last saw her. She states this is where the home health nurse that was coming out remove the dressing without wetting the alginate first. Nonetheless I do not know if this is indeed the case or not but more importantly we have not ordered home help to be coming out for her wounds at all. I'm unsure as to why they are coming out and we're gonna have to check on this and get things situated in that regard. With that being said we currently really do not need them to be coming out as the patient has been taking care of her leg herself without complication and no issues. In fact she was doing much better prior to nursing coming out. 11/25/17 on evaluation today patient actually appears to be doing fairly well in regard to her left lower extremity swelling. In fact she has very little area of weeping at this point there's just a small spot on the lateral portion of her right leg that still has me just a little bit more concerned as far as wanting to see this clear up before I discharge her to caring for this at home. Nonetheless overall she has made excellent progress. 12/09/17 on evaluation today patient appears to be doing rather well in regard to her lower extremity edema. She does have some weeping still in the left  lower extremity although the big area we were taking care of two  weeks ago actually has closed and she has another area of weeping on the left lower extremity immediately as well is the top of her foot. She does not currently have lymphedema pumps she has been wearing her compression daily on a regular basis as directed. With that being said I think she may benefit from lymphedema pumps. 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 We had a very difficult time getting and keeping this under control. 12/23/17 on evaluation today patient actually appears to be doing a little bit more poorly in regard to her bilateral lower extremities. She has been tolerating the Juxta-Lite compression wraps. Unfortunately she has two new ulcers on the right lower extremity and left lower [place] ulceration seems to be larger. Obviously this is not good news. She has been tolerating the dressings without complication. 12/30/17 on evaluation today patient actually appears to be doing much better in regard to her bilateral lower extremity edema. She continues to have some issues with ulcerations and in fact there appears to be one spot on each leg where the wrap may have caused a little bit of a blister which is subsequently opened up at this point is given her pain. Fortunately it does not appear to be any evidence of infection which is good news. No fevers chills noted. 01/13/18 on evaluation today patient appears to be doing rather well in regard to her bilateral lower extremities. The dressings did get kind of stuck as far as the wound beds are concerned but again I think this is mainly due to the fact that she actually seems to be showing signs of healing which is good news. She's not having as much drainage therefore she was having more of the dressing sticking.  Nonetheless overall I feel like her swelling is dramatically down compared to previous. 01/20/18 on evaluation today patient unfortunately though she's doing better in most regards has a large blister on the left anterior lower extremity where she is draining quite significantly. Subsequently this is going to need debridement today in order to see what's underneath and ensure she does not continue to trapping fluid at this location. Nonetheless No fevers, chills, nausea, or vomiting noted at this time. 01/27/18 on evaluation today patient appears to be doing rather well at this point in regard to her right lower extremity there's just a very small area that she still has open at this point. With that being said I do believe that she is tolerating the compression wraps very well in making good progress. Home health is coming out at this point to see her. Her left lower extremity on the lateral portion is actually what still mainly open and causing her some discomfort for the most part 02/10/18 on evaluation today patient actually appears to be doing very well in regard to her right lower extremity were all the ulcers appear to be completely close. In regard to the left lower extremity she does have two areas still open and some leaking from the dorsal surface of her foot but this still seems to be doing much better to me in general. 02/24/18 on evaluation today patient actually appears to be doing much better in regard to her right lower extremity this is still completely healed. Her left lower extremity is also doing much better fortunately she has no evidence of infection. The one  area that is gonna require some debridement is still on the left anterior shin. Fortunately this is not hurting her as badly today. 03/10/18 on evaluation today patient appears to be doing better in some regards although she has a little bit more open area on the dorsal foot and she also has some issues on the medial portion of  the left lower extremity which is actually new and somewhat deep. With that being said there fortunately does not appear to be any significant signs of infection which is good news. No fevers, chills, nausea, or vomiting noted at this time. In general her swelling seems to be doing fairly well which is good news. 03/31/18 on evaluation today patient presents for follow-up concerning her left lower extremity lymphedema. Unfortunately she has been doing a little bit more poorly since I last saw her in regard to the amount of weeping that she is experiencing. She's also having some increased pain in the anterior shin location. Unfortunately I do not feel like the patient is making such good progress at this point a few weeks back she was definitely doing much better. 04/07/18 on evaluation today patient actually appears to be showing some signs of improvement as far as the left lower extremity is concerned. She has been tolerating the dressing changes and it does appear that the Drawtex did better for her. With that being said unfortunately home health is stating that they cannot obtain the Drawtex going forward. Nonetheless we're gonna have to check and see what they may be able to get the alginate they were using was getting stuck in causing new areas of skin being pulled all that with and subsequently weep and calls her to worsen overall this is the first time we've seen improvement at this time. 04/14/18 on evaluation today patient actually appears to be doing rather well at this point there does not appear to be any evidence of infection at this time and she is actually doing excellent in regard to the weeping in fact she almost has no openings remaining even compared to just last week this is a dramatic improvement. No fevers chills noted 04/21/18 evaluation today patient actually appears to be doing very well. She in fact is has a small area on the posterior lower extremity location and she has  a small area on the dorsal surface of her foot that are still open both of which are very close to closing. We're hoping this will be close shortly. She brought her Juxta-Lite wrap with her today hoping that would be able to put her in it unfortunately I don't think were quite at that point yet but we're getting closer. 04/28/18 upon evaluation today patient actually appears to be doing excellent in regard to her left lower extremity ulcer. In fact the region on the posterior lower extremity actually is much smaller than previously noted. Overall I'm very happy with the progress she has made. She again did bring her Juxta-Lite although we're not quite ready for that yet. 05/11/18 upon evaluation today patient actually appears to be doing in general fairly well in regard to her left lower Trinity. The swelling is very well controlled. With that being said she has a new area on the left anterior lower extremity as well as between the first and second toes of her left foot that was not present during the last evaluation. The region of her posterior left lower extremity actually appears to be almost completely healed. T be honest I'm very pleased  with o the way that stands. Nonetheless I do believe that the lotion may be keeping the area to moist as far as her legs are concerned subsequently I'm gonna consider discontinuing that today. 05/26/18 on evaluation today patient appears to be doing rather well in regard to her left lower should be ulcers. In fact everything appears to be close except for a very small area on the left posterior lower extremity. Fortunately there does not appear to be any evidence of infection at this time. Overall very pleased with her progress. 06/02/18 and evaluation today patient actually appears to be doing very well in regard to her lower extremity ulcers. She has one small area that still continues to weep that I think may benefit her being able to justify lotion and user  Juxta-Lite wraps versus continued to wrap her. Nonetheless I think this is something we can definitely look into at this point. 06/23/18 on evaluation today patient unfortunately has openings of her bilateral lower extremities. In general she seems to be doing much worse than when I last saw her just as far as her overall health standpoint is concerned. She states that her discomfort is mainly due to neuropathy she's not having any other issues otherwise. No fevers, chills, nausea, or vomiting noted at this time. 06/30/18 on evaluation today patient actually appears to be doing a little worse in regard to her right lower extremity her left lower extremity of doing fairly well. Fortunately there is no sign of infection at this time. She has been tolerating the dressing changes without complication. Home health did not come out like they were supposed to for the appropriate wrap changes. They stated that they never received the orders from [place] which were fax. Nonetheless we will send a copy of the orders with the patient today as well. 07/07/18 on evaluation today patient appears to be doing much better in regard to lower extremities. She still has several openings bilaterally although since I last saw her her legs did show obvious signs of infection when she later saw her nurse. Subsequently a culture was obtained and she is been placed on Bactrim and Keflex. Fortunately things seem to be looking much better it does appear she likely had an infection. Again last week we'd even discussed it but again there really was not any obvious sign that she had infection therefore we held off on the antibiotics. Nonetheless I'm glad she's doing better today. 07/14/18 on evaluation today patient appears to be doing much better regarding her bilateral lower Trinity's. In fact on the right lower for me there's nothing open at this point there are some dry skin areas at the sites where she had infection. Fortunately there is  no evidence of systemic infection which is excellent news. No fevers chills noted 07/21/18 on evaluation today patient actually appears to be doing much better in regard to her left lower extremity ulcers. She is making good progress and overall I feel like she's improving each time I see her. She's having no pain I do feel like the infection is completely resolved which is excellent news. No fevers, chills, nausea, or vomiting noted at this time. 07/28/18 on evaluation today patient appears to be doing very well in regard to her left lower [employer]. Everything seems to be showing signs of improvement which is excellent news. Overall very pleased with the progress that has been made. Fortunately there's no evidence of active infection at this time also excellent news. 08/04/18 on evaluation  today patient appears to be doing more poorly in regard to her bilateral lower extremities. She has two new areas open up on the right and these were completely closed as of last week. She still has the two spots on the left which in my pinion seem to be doing better. Fortunately there's no evidence of infection again at this point. 08/11/18 on evaluation today patient actually appears to be doing very well in regard to her bilateral lower Trinity wounds that all seem to be doing better and are measures smaller today. Fortunately there's no signs of infection. No fevers, chills, nausea, or vomiting noted at this time. 08/18/18 on evaluation today patient actually appears to be doing about the same inverter bilateral lower extremities. She continues to have areas that blistering open as was drain that fortunately nothing too significant. Overall I feel like Drawtex may have done better for her however compared to the collagen. 08/25/18 on evaluation today patient appears to be doing a little bit more poorly today even compared to last time I saw her. Again I'm not exactly sure why she's making worse progress over  the past several weeks. I'm beginning to wonder if there is some kind of underlying low level infection causing this issue. I did actually take a culture from the left anterior lower extremity but it was a new wound draining quite a bit at this point. Unfortunately she also seems to be having more pain which is what also makes me worried about the possibility of infection. This is despite never erythema noted at this point. 09/01/18 on evaluation today patient actually appears to be doing a little worse even compared to last week in regard to bilateral lower extremities. She did go to the hospital on the 16th was given a dose of IV Zosyn and then discharged with a recommendation to continue with the Bactrim that I previously prescribed for her. Nonetheless she is still having a lot of discomfort she tells me as well at this time. This is definitely unfortunate. No fevers, chills, nausea, or vomiting noted at this time. 09/08/18 on evaluation today patient's bilateral lower extremities actually appear to be shown signs of improvement which is good news. Fortunately there does not appear to be any signs of active infection I think the anabiotic is helping in this regard. Overall I'm very pleased with how she is progressing. 09/15/18 patient was actually seen in ER yesterday due to her legs as well unfortunately. She states she's been having a lot of pain and discomfort as well as a lot of drainage. Upon inspection today the patient does have a lot of swelling and drainage I feel like this is more related to lymphedema and poor fluid control than it is to infection based on what I'm seeing. The physician in the emergency department also doubted that the patient was having a significant infection nonetheless I see no evidence of infection obvious at this point although I do see evidence of poor fluid control. She still not using a compression pumps, she is not elevating due to her lift chair as well as her  hospital bed being broken, and she really is not keeping her legs up as much as they should be and also has been taking off her wraps. All this combined I think has led to poor fluid control and to be honest she may be somewhat volume overloaded in general as well. I recommend that she may need to contact your physician to see if a  prescription for a diuretic would be beneficial in their opinion. As long as this is safe I think it would likely help her. 09/29/18 on evaluation today patient's left lower extremity actually appears to be doing quite a bit better. At least compared to last time that I saw her. She still has a large area where she is draining from but there's a lot of new skin speckled trout and in fact there's more new skin that there are open areas of weeping and drainage at this point. This is good news. With regard to the right lower extremity this is doing much better with the only open area that I really see being a dry spot on the right lateral ankle currently. Fortunately there's no signs of active infection at this time which is good news. No fevers, chills, nausea, or vomiting noted at this time. The patient seems somewhat stressed and overwhelmed during the visit today she was very lethargic as such. She does and she is not taking any pain medications at this point. Apparently according to her husband are also in the process of moving which is probably taking its toll on her as well. 10/06/18 on evaluation today patient appears to be doing rather well in regard to her lower extremities compared to last evaluation. Fortunately there's no signs of active infection. She tells me she did have an appointment with her primary. Nonetheless he was concerned that the wounds were somewhat deep based on pictures but we never actually saw her legs. She states that he had her somewhat worried due to the fact that she was fearing now that she was [place] have to have an amputation. With that being  said based on what I'm seeing check she looks better this week that she has the last two times I've seen her with much less drainage I'm actually pleased in this regard. That doesn't mean that she's out of the water but again I do not think what the point of talking about education at all in regard to her leg. She is very happy to hear this. She is also not having as much pain as she was having last week. 10/13/18 unfortunately on evaluation today patient still continues to have a significant amount of drainage she's not letting home health actually apply the compression dressings at this point. She's trying to use of Juxta-Lite of the top of Kerlex and the second layer of the three layer compression wrap. With that being said she just does not seem to be making as good a progress as I would expect if she was having the compression applied and in place on a regular basis. No fevers, chills, nausea, or vomiting noted at this time. 10/20/18 on evaluation today patient appears to be doing a little better in regard to her bilateral lower extremity ulcers. In fact the right lower extremity seems to be healed she doesn't even have any openings at this point left lower extremity though still somewhat macerated seems to be showing signs of new skin growth at multiple locations throughout. Fortunately there's no evidence of active infection at this time. No fevers, chills, nausea, or vomiting noted at this time. 10/27/18 on evaluation today patient appears to be doing much better in regard to her left lower Trinity ulcer. She's been tolerating the laptop complication and has minimal drainage noted at this point. Fortunately there's no signs of active infection at this time. No fevers, chills, nausea, or vomiting noted at this time. 11/03/18 on evaluation today  patient actually appears to be doing excellent in regard to her left lower extremity. She is having very little drainage at this point there does not appear  to be any significant signs of infection overall very pleased with how things have gone. She is likewise extremely pleased still and seems to be making wonderful progress week to week. I do believe antibiotics were helpful for her. Her primary care provider did place on amateur clean since I last saw her. 11/17/18 on evaluation today patient appears to be doing worse in regard to her bilateral lower extremities at this point. She is been tolerating the dressing changes without complication. With that being said she typically takes the Coban off fairly quickly upon arriving home even after being seen here in the clinic and does not allow home health reapply command as part of the dressing at home. Therefore she said no compression essentially since I last saw her as best I can tell. With that being said I think it shows and how much swelling she has in the open wounds that are noted at this point. Fortunately there's no signs of infection but unfortunately if she doesn't get this under control I think she will end up with infection and more significant issues. 11/24/18 on evaluation today patient actually appears to be doing somewhat better in regard to her bilateral lower extremities. 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 Nonetheless that is doing better currently so she should be able to attempt reinitiating the lymphedema pumps at this time. No fevers, chills, nausea, or vomiting noted at this time. 12/01/18 upon evaluation today patient's left lower extremity appears to be doing quite well unfortunately her right lower extremity is not doing nearly as well. She has been tolerating the dressing changes without complication unfortunately she did not keep a wrap on the right at this time. Nonetheless I believe this has led to increased swelling and weeping in  the world is actually much larger than during the last evaluation with her. 12/08/18 on evaluation today patient appears to be doing about the same at this point in regard to her right lower extremity. There is some more palatable to touch I'm concerned about the possibility of there being some infection although I think the main issue is she's not keeping her compression wrap on which in turn is not allowing this area to heal appropriately. 12/22/18 on evaluation today patient appears to be doing better in regard to left lower extremity unfortunately significantly worse in regard to the right lower extremity. The areas of blistering and necrotic superficial tissue have spread and again this does not really appear to be signs of infection and all she just doesn't seem to be doing nearly as well is what she has been in the past. Overall I feel like the Augmentin did absolutely nothing for her she doesn't seem to have any infection again I really didn't think so last time either is more of a potential preventative measure and hoping that this would make some difference but I think the main issue is she's not wearing her compression. She tells me she cannot wear the [place] been we put on she takes it off pretty much upon getting home. Subsequently she worshiped Juxta-Lite when I questioned her about how often she wears it this is no  more than three hours a day obviously that leaves 21 hours that she has no compression and this is obviously not doing well for her. Overall I'm concerned that if things continue to worsen she is at great risk of both infection as well as losing her leg. 01/05/19 on evaluation today patient appears to be doing well in regard to her left lower extremity which he is allowing [place] to wrap and not so well with regard to her right lower extremity which she is not allowing [place] to really wrap and keep the wrap on. She states that it hurts too badly whenever it's wrapped and she ends up  having to take it off. She's been using the Juxta-Lite she tells me up to six hours a day although I question whether or not that's really been the case to be honest. Previously she told me three hours today nonetheless obviously the legs as long as the wrap is doing great when she is not is doing much more poorly. 01/12/2019 on evaluation today patient actually appears to be doing a little better in my opinion with regard to her right lower extremity ulcer. She has a small open area on the left lower extremity unfortunately but again this I think is part of the normal fluctuation of what she is going to have to expect with regard to her legs especially when she is not using her lymphedema pumps on a regular basis. Subsequently based on what I am seeing today I think that she does seem to be doing slightly better with regard to her right lower extremity she did see her primary care provider on Monday they felt she had an infection and placed her on 2 antibiotics. Both Cipro and clindamycin. Subsequently again she seems possibly to be doing a little bit better in regards to the right lower extremity she also tells me however she has been wearing the compression wrap over the past week since I spoke with her as well that is a Kerlix and Coban wrap on the right. No fevers, chills, nausea, vomiting, or diarrhea. 01/19/2019 on evaluation today patient appears to be doing better with regard to her bilateral lower extremities especially the right. I feel like the compression has been beneficial for her which is great news. She did get a call from her primary care provider on her way here today telling her that she did have methicillin- resistant Staphylococcus aureus and he was calling in a couple new antibiotics for her including a ointment to be applied she tells me 3 times a day. With that being said this sounds like likely to be Bactroban which I think could be applied with each dressing/wrap change but I  would not be able to accommodate her applying this 3 times a day. She is in agreement with the least doing this we will add that to her orders today. 01/26/2019 on evaluation today patient actually appears to be doing much better with regard to her right lower extremity. Her left lower extremity is also doing quite well all things considering. Fortunately there is no evidence of active infection at this time. No fevers, chills, nausea, vomiting, or diarrhea. 02/02/2019 on evaluation today patient appears to be doing much better compared to her last evaluation. Little by little off like her right leg is returning more towards normal. There does not appear to be any signs of active infection and overall she seems to be doing quite well which is great news. I am very pleased  in this regard. No fevers, chills, nausea, vomiting, or diarrhea. 02/09/2019 upon evaluation today patient appears to be doing better with regard to her bilateral lower extremities. She has been tolerating the dressing changes without complication. Fortunately there is no signs of active infection at this time. No fevers, chills, nausea, vomiting, or diarrhea. 02/23/2019 on evaluation today patient actually appears to be doing quite well with regard to her bilateral lower extremities. She has been tolerating the dressing changes without complication. She is even used her pumps one time and states that she really felt like it felt good. With that being said she seems to be in good spirits and her legs appear to be doing excellent. 03/09/2019 on evaluation today patient appears to be doing well with regard to her right lower extremity there are no open wounds at this time she is having some discomfort but I feel like this is more neuropathy than anything. With regard to her left lower extremity she had several areas scattered around that she does have some weeping and drainage from but again overall she does not appear to be having any  significant issues and no evidence of infection at this time which is good news. 03/23/2019 on evaluation today patient appears to be doing well with regard to her right lower extremity which she tells me is still close she is using her juxta light here. Her left lower extremity she mainly just has an area on the foot which is still slightly draining although this also is doing great. Overall very pleased at this time. 04/06/2019 patient appears to be doing a little bit worse in regard to her left lower extremity upon evaluation today. She feels like this could be becoming infected again which she had issues with previous. Fortunately there is no signs of systemic infection but again this is always a struggle with her with her legs she will go from doing well to not so well in a very short amount of time. 04/20/2019 on evaluation today patient actually appears to be doing quite well with regard to her right lower extremity I do not see any signs of active infection at this time. Fortunately there is no fever chills noted. She is still taking the antibiotics which I prescribed for her at this point. In regard to the left lower extremity I do feel like some of these areas are better although again she still is having weeping from several locations at this time. 04/27/2019 on evaluation today patient appears to be doing about the same if not slightly worse in regard to her left lower extremity ulcers. She tells me when questioned that she has been sleeping in her Hoveround chair in fact she tells me she falls asleep without even knowing it. I think she is spending a whole lot of time in the chair and less time walking and moving around which is not good for her legs either. On top of that she is in a seated position which is also the worst position she is not really elevating her legs and she is also not using her lymphedema pumps. All this is good to contribute to worsening of her condition in  general. 05/18/2019 on evaluation today patient appears to be doing well with regard to her lower extremity on the right in fact this is showing no signs of any open wounds at this time. On the left she is continuing to have issues with areas that do drain. Some of the regions have healed and  there are couple areas that have reopened. She did go to the ER per the patient according to recommendations from the home health nurse due to what she was seen when she came out on 05/13/2019. Subsequently she felt like the patient needed to go to the hospital due to the fact that again she was having "milky white discharge" from her leg. Nonetheless she had and then was placed on doxycycline and subsequently seems to be doing better. 06/01/2019 upon evaluation today patient appears to be doing really in my opinion about the same. I do not see any signs of active infection which is good news. Overall she still has wounds over the bilateral lower extremities she has reopened on the right but this appears to be more of a crack where there is weeping/edema coming from the region. I do not see any evidence of infection at either site based on what I visualized today. 07/13/2019 upon evaluation today patient appears to be doing a little worse compared to last time I saw her. She since has been in the hospital from 06/21/2019 through 06/29/2019. This was secondary to having Covid. During that time they did apply lotion to her legs which unfortunately has caused her to develop a myriad of open wounds on her lower extremities. Her legs do appear to be doing better as far as the overall appearance is concerned but nonetheless she does have more open and weeping areas. 07/27/2019 upon evaluation today patient appears to be doing more poorly to be honest in regard to her left lower extremity in particular. There is no signs of systemic infection although I do believe she may have local infection. She notes she has been having  a lot of blue/green drainage which is consistent potentially with Pseudomonas. That may be something that we need to consider here as well. The doxycycline does not seem to have been helping. 08/03/2019 upon evaluation today patient appears to be doing a little better in my opinion compared to last week's evaluation. Her culture I did review today and she is on appropriate medications to help treat the Enterobacter that was noted. Overall I feel like that is good news. With that being said she is unfortunately continuing to have a lot of drainage and though it is doing better I still think she has a long ways to go to get things dried up in general. Fortunately there is no signs of systemic infection. 08/10/2019 upon evaluation today patient appears to be doing may be slightly better in regard to her left lower extremity the right lower extremity is doing much better. Fortunately there is no signs of infection right now which is good news. No fevers, chills, nausea, vomiting, or diarrhea. 08/24/2019 on evaluation today patient appears to be doing slightly better in regard to her lower extremities. The left lower extremity seems to be healed the right lower extremity is doing better though not completely healed as far as the openings are concerned. She has some generalized issues here with edema and weeping secondary to her lymphedema though again I do believe this is little bit drier compared to prior weeks evaluations. In general I am very pleased with how things seem to be progressing. No fevers, chills, nausea, vomiting, or diarrhea. 08/31/2019 upon evaluation today patient actually seems to making some progress here with regard to the left lower extremity in particular. She has been tolerating the dressing changes without complication. Fortunately there is no signs of active infection at this time. No fevers,  chills, nausea, vomiting, or diarrhea. She did see Dr. Doren Custard and he did note that she did have  a issue with the left great saphenous vein and the small saphenous vein in the leg. With that being said he was concerned about the possibility of laser ablation not being extremely successful. He also mentioned a small risk of DVT associated with the procedure. However if the wounds do not continue to improve he stated that that would probably be the way to go. Fortunately the patient's legs do seem to be doing much better. 09/07/2019 upon evaluation today patient appears to be doing better with regard to her lower extremities. She has been tolerating the dressing changes without complication. With that being said she is showing signs of improvement and overall very pleased. There are some areas on her leg that I think we do need to debride we discussed this last week the patient is in agreement with doing that as long as it does not hurt too badly. 09/14/2019 upon evaluation today patient appears to be doing decently well with regard to her left lower extremity. She is not having near as much weeping as she has had in the past things seem to be drying up which is good news. There is no signs of active infection at this time. 09/21/19 upon evaluation today patient appears to be doing better in regard overall to her bilateral lower extremities. She again has less open than she did previous and each week I feel like this is getting better. Fortunately there is no signs of active infection at this time. No fevers, chills, nausea, vomiting, or diarrhea. 09/28/2019 upon evaluation today patient actually appears to be showing signs of improvement with regard to her left lower extremity. Unfortunately the right medial lower extremity around the ankle region has reopened to some degree but this appears to be minimal still which is good news. There is no signs of active infection at this time which is also good news. 10/12/2019 upon evaluation today patient appears to be doing okay with regard to her bilateral  lower extremities today. The right is a little bit worse then last evaluation 2 weeks ago. The left is actually doing a little better in my opinion. Overall there is no signs of active infection at this time that I see. Obviously that something we have to keep a close eye on she is very prone to this with the significant and multiple openings that she has over the bilateral lower extremities. Electronic Signature(s) Signed: 10/12/2019 1:34:35 PM By: [name] PA-C Entered By: [name] on 10/12/2019 13:34:35 -------------------------------------------------------------------------------- Physical Exam Details Patient Name: Date of Service: [name], [name] 10/12/2019 10:30 A M Medical Record Number: [id number] Patient Account Number: [id number] Date of Birth/Sex: Treating RN: 1950/09/30 (69 y.o. [name] Primary Care Provider: [name] Other Clinician: Referring Provider: Treating Provider/Extender: Doyle Askew, FRED Weeks in Treatment: 114 Constitutional Obese and well-hydrated in no acute distress. Respiratory normal breathing without difficulty. Psychiatric this patient is able to make decisions and demonstrates good insight into disease process. Alert and Oriented x 3. pleasant and cooperative. Notes Upon inspection patient again had some areas of breakdown over the bilateral lower extremities but there does not appear to be any evidence of active infection at this time which is good news. No fevers, chills, nausea, vomiting, or diarrhea. Electronic Signature(s) Signed: 10/12/2019 1:34:58 PM By: [name] PA-C Entered By: [name] on 10/12/2019 13:34:58 -------------------------------------------------------------------------------- Physician  Orders Details Patient Name: Date of Service: [name], [name] 10/12/2019 10:30 A M Medical Record Number: [id number] Patient Account Number: [id number] Date of Birth/Sex: Treating  RN: 07/16/1950 (69 y.o. [name] Primary Care Provider: [name] Other Clinician: Referring Provider: Treating Provider/Extender: Doyle Askew, FRED Weeks in Treatment: [phone] Verbal / Phone Orders: No Diagnosis Coding ICD-10 Coding Code Description E11.622 Type 2 diabetes mellitus with other skin ulcer I89.0 Lymphedema, not elsewhere classified I87.331 Chronic venous hypertension (idiopathic) with ulcer and inflammation of right lower extremity I87.332 Chronic venous hypertension (idiopathic) with ulcer and inflammation of left lower extremity L97.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 leg with fat layer exposed L97.822 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 I10 Essential (primary) hypertension E66.01 Morbid (severe) obesity due to excess calories F41.8 Other specified anxiety disorders R53.1 Weakness Follow-up Appointments Return Appointment in 1 week. Dressing Change Frequency Wound #61 Left,Circumferential Lower Leg Change dressing three times week. Wound #64 Left,Dorsal Foot Change dressing three times week. Wound #65 Right,Medial Lower Leg Change dressing three times week. Skin Barriers/Peri-Wound Care Barrier cream - zinc oxide cream to any macerated areas Moisturizing lotion Wound Cleansing Clean wound with Wound Cleanser - all wounds May shower with protection. Primary Wound Dressing Wound #61 Left,Circumferential Lower Leg Calcium Alginate with Silver Wound #64 Left,Dorsal Foot Calcium Alginate with Silver Wound #65 Right,Medial Lower Leg Calcium Alginate with Silver Secondary Dressing Wound #61 Left,Circumferential Lower Leg Dry Gauze - all wounds ABD pad - as needed Zetuvit or Kerramax - as needed for excess drainage Wound #64 Left,Dorsal Foot Dry Gauze - all wounds ABD pad - as  needed Wound #65 Right,Medial Lower Leg Dry Gauze Edema Control 3 Layer Compression System - Bilateral void standing for long periods of time - walking is encouraged A Elevate legs to the level of the heart or above for 30 minutes daily and/or when sitting, a frequency of: - do not sleep in chair with feet dangling, MUST elevate legs while sitting Exercise regularly Segmental Compressive Device. - lymphedema pumps 60 minutes 1- 2 times per day Off-Loading Turn and reposition every 2 hours Additional Orders / Instructions Follow Nutritious Diet - T include vitamin A, vitamin C, and Zinc along with increased protein intake. o [facility] for wound care. - [facility] Electronic Signature(s) Signed: 10/12/2019 5:55:37 PM By: Levan Hurst RN, BSN Signed: 10/14/2019 9:02:44 AM By: [name] PA-C Entered By: Levan Hurst on 10/12/2019 11:56:56 -------------------------------------------------------------------------------- Problem List Details Patient Name: Date of Service: [name]. 10/12/2019 10:30 A M Medical Record Number: [id number] Patient Account Number: [id number] Date of Birth/Sex: Treating RN: 12-10-1950 (69 y.o. [name] Primary Care Provider: [name] Other Clinician: Referring Provider: Treating Provider/Extender: Doyle Askew, FRED Weeks in Treatment: [phone] Active Problems ICD-10 Encounter Code Description Active Date MDM Diagnosis E11.622 Type 2 diabetes mellitus with other skin ulcer 08/05/2017 No Yes I89.0 Lymphedema, not elsewhere classified 08/05/2017 No Yes I87.331 Chronic venous hypertension (idiopathic) with ulcer and inflammation of right 08/05/2017 No Yes lower extremity I87.332 Chronic venous hypertension (idiopathic) with ulcer and inflammation of left 08/05/2017 No Yes lower extremity L97.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 leg with fat layer 08/05/2017 No  Yes exposed L97.822 Non-pressure chronic ulcer of other part of left lower leg with fat layer  exposed2/20/2019 No Yes L97.522 Non-pressure chronic ulcer of other part of left foot with fat layer exposed 06/01/2019 No Yes I10 Essential (primary) hypertension 08/05/2017 No Yes E66.01 Morbid (severe) obesity due to excess calories 08/05/2017 No Yes F41.8 Other specified anxiety disorders 08/05/2017 No Yes R53.1 Weakness 08/05/2017 No Yes Inactive Problems Resolved Problems Electronic Signature(s) Signed: 10/12/2019 10:31:57 AM By: [name] PA-C Entered By: [name] on 10/12/2019 10:31:57 -------------------------------------------------------------------------------- Progress Note Details Patient Name: Date of Service: [name]. 10/12/2019 10:30 A M Medical Record Number: [id number] Patient Account Number: [id number] Date of Birth/Sex: Treating RN: 04-06-1951 (69 y.o. [name] Primary Care Provider: [name] Other Clinician: Referring Provider: Treating Provider/Extender: Doyle Askew, FRED Weeks in Treatment: [phone] Subjective Chief Complaint Information obtained from Patient Bilateral reoccurring LE ulcers History of Present Illness (HPI) this patient has been seen a couple of times before and returns with recurrent problems to her right and left lower extremity with swelling and weeping ulcerations due to not wearing her compression stockings which she had been advised to do during her last discharge, at the end of June 2018. During her last visit the patient had had normal arterial blood flow and her venous reflux study did not necessitate any surgical intervention. She was recommended compression and elevation and wound care. After prolonged treatment the patient was completely healed but she has been noncompliant with wearing or compressions.. She was here last week with an outpatient return visit planned but the patient came in a very poor  general condition with altered mental status and was rushed to the ER on my request. With a history of hypertension, diabetes, TIA and right-sided weakness she was set up for an MRI on her brain and cervical spine and was sent to [facility]. Getting an MRI done was very difficult but once the workup was done she was found not to have any spinal stenosis, epidural abscess or hematoma or discitis. This was radiculopathy to be treated as an outpatient and she was given a follow-up appointment. Today she is feeling much better alert and oriented and has come to reevaluate her bilateral lower extremity lymphedema and ulceration 03/25/2017 -- she was admitted to the hospital on 03/16/2017 and discharged on 03/18/2017 with left leg cellulitis and ulceration. She was started on vancomycin and Zosyn and x-ray showed no bony involvement. She was treated for a cellulitis with IV antibiotics changed to Rocephin and Flagyl and was discharged on oral Keflex and doxycycline to complete a 7 day course. Last hemoglobin A1c was 7.1 and her other ailments including hypertension got asthma were appropriately treated. 05/06/2017 -- she is awaiting the right size of compression stockings from [place] but other than that has been doing well. ====== Old notes 69 year old patient was seen one time last October and was lost to follow-up. She has recurrent problems with weeping and ulceration of her left lower extremity and has swelling of this for several years. It has been worse for the last 2 months. Past medical history is significant for diabetes mellitus type 2, hypertension, gout, morbid obesity, depressive disorders, hiatal hernia, migraines, status post knee surgery, risk of a cholecystectomy, vaginal hysterectomy and breast biopsy. She is not a smoker. As noted before she has never had a venous duplex study and an arterial ABI study was attempted but the left lower extremity was noncompressible 10/01/2016 -- had a  lower extremity venous duplex reflux evaluation which showed no evidence of deep vein reflux in  the right or left lower extremity, and no evidence of great saphenous vein reflux more than 500 ms in the right or left lower extremity, and the left small saphenous vein is incompetent but no vascular consult was recommended. review of her electronic medical records noted that the ABI was checked in July 2017 where the right ABI was normal limits and the left ABI could not be ascertained due to pain with cuff pressure but the waveforms are within normal limits. her arterial duplex study scheduled for April 27. 10/08/2016 -- the patient has various reasons for not having a compression on and for the last 3 days she has had no compression on her left lower extremity either due to pain or the lack of nursing help. She does not use her juxta lites either. 10/15/2016 -- the patient did not keep her appointment for arterial duplex study on April 27 and I have asked her to reschedule this. Her pain is out of proportion with the physical findings and she continuously fails to wear a compression wraps and cuts them off because she says she cannot tolerate the pain. She does not use her juxta lites either. 10/22/2016 -- he has rescheduled her arterial duplex study to May 21 and her pain today is a bit better. She has not been wearing her juxta lites on her right lower extremity but now understands that she needs to do this. She did tolerate the to press compression wrap on her left lower extremity 10/29/2016 --arterial duplex study is scheduled for next week and overall she has been tolerating her compression wraps and also using her juxta lites on her right lower extremity 11/05/2016 -- the right ABI was 0.95 the left was 1.03. The digit TBI is on the right was 0.83 on the left was 0.92 and she had biphasic flow through these vessels. The impression was that of normal lower extremity arterial study. 11/12/2016 --  her pain is minimal and she is doing very well overall. 11/26/2016 -- she has got juxta lites and her insurance will not pay for additional dual layer compression stockings. She is going to order some from [place]. 05/12/2017 -- her juxta lites are very old and too big for her and these have not been helping with compression. She did get 20-30 mm compression stockings from [place] but she and her husband are unable to put these on. I believe she will benefit from bilateral Extremit-ease, compression stockings and we will measure her for these today. 05/20/2017 -- lymphedema on the left lower extremity has increased a lot and she has a open ulceration as a result of this. The right lower extremity is looking pretty good. She has decided to by the compression stockings herself and will get reimbursed by the home health, at a later date. 05/27/2017 -- her sciatica is bothering her a lot and she thought her left leg pain was caused due to the compression wrap and hence removed it and has significant lymphedema. There is no inflammation on this left lower extremity. 06/17/17 on evaluation today patient appears to be doing very well and in fact is completely healed in regard to her ulcerations. Unfortunately however she does have continued issues with lymphedema nonetheless. We did order compression garments for her unfortunately she states that the size that she received were large although we ordered medium. Obviously this means she is not getting the optimal compression. She does not have those with her today and therefore we could not confirm and contact the  company on her behalf. Nonetheless she does state that she is going to have her husband bring them by tomorrow so that we can verify and then get in touch with the company. No fevers, chills, nausea, or vomiting noted at this time. Overall patient is doing better otherwise and I'm pleased with the progress she has made. 07/01/17 on evaluation today  patient appears to be doing very well in regard to her bilateral lower extremity she does not have any openings at this point which is excellent news. Overall I'm pleased with how things have progressed up to this time. Since she is doing so well we did order her compression which we are seeing her today to ensure that it fits her properly and everything is doing well in that regard and then subsequently she will be discharged. ============ Old Notes: 03/31/16 patient presents today for evaluation concerning open wounds that she has over the left medial ankle region as well as the left dorsal foot. She has previously had this occur although it has been healed for a number of months after having this for about a year prior until her hospitalization on 01/05/16. At that point in time it appears that she was admitted to the hospital for left lower extremity cellulitis and was placed on vancomycin and Zosyn at that point. Eventually upon discharge on January 15, 2016 she was placed on doxycycline at that point in time. Later on 03/27/16 positive wound culture growing Escherichia coli this was switched to amoxicillin. Currently she tells me that she is having pain radiated to be a 7 out of 10 which can be as high as 10 out of 10 with palpation and manipulation of the wound. This wound appears to be mainly venous in nature due to the bilateral lower extremity venous stasis/lymphedema. This is definitely much worse on her left than the right side. She does have type 1 diabetes mellitus, hypertension, morbid obesity, and is wheelchair dependent.during the course of the hospital stay a blood culture was also obtained and fortunately appeared negative. She also had an x-ray of the tibia/fibula on the left which showed no acute bone abnormality. Her white blood cell count which was performed last on 03/25/16 was 7.3, hemoglobin 12.8, protein 7.1, albumin 3.0. Her urine culture appeared to be negative for any  specific organisms. Patient did have a left lower extremity venous duplex evaluation for DVT . This did not include venous reflux studies but fortunately was negative for DVT Patient also had arterial studies performed which revealed that she had a . normal ABI on the right though this was unable to be performed on the left secondary to pain that she was having around the ankle region due to the wound. However it was stated on report that she had biphasic pulses and apparently good blood flow. ========== 06/03/17 she is here in follow-up evaluation for right lower extremity ulcer. The right lower sure he has healed but she has reopened to the left medial malleolus and dorsal foot with weeping. She is waiting for new compression garments to arrive from home health, the previous compression garments were ill fitting. We will continue with compression bilaterally and follow-up in 2 weeks Readmission: 08/05/17 on evaluation today patient appears to be doing somewhat poorly in regard to her left lower extremity especially although the right lower extremity has a small area which may no longer be open. She has been having a lot of drainage from the left lower extremity however he tells me  that she has not been able to use the EXTREMIT-EASE Compression at this point. She states that she did better and was able to actually apply the Juxta-Lite compression although the wound that she has is too large and therefore really does not compress which is why she cannot wear it at this point. She has no one who can help her put it on regular basis her son can sometimes but he's not able to do it most of the time. I do believe that's why she has begun to weave and have issues as she is currently yet again. No fevers, chills, nausea, or vomiting noted at this time. Patient is no evidence of dementia. 08/12/17 on evaluation today patient appears to still be doing fairly well in regard to the draining areas/weeping areas  at this point. With that being said she unfortunately did go to the ER yesterday due to what was felt to be possibly a cellulitis. They place her on doxycycline by mouth and discharge her home. She definitely was not admitted. With that being said she states she has had more discomfort which has been unusual for her even compared to prior times and she's had infections.08/12/17 on evaluation today patient appears to still be doing fairly well in regard to the draining areas/weeping areas at this point. With that being said she unfortunately did go to the ER yesterday due to what was felt to be possibly a cellulitis. They place her on doxycycline by mouth and discharge her home. She definitely was not admitted. With that being said she states she has had more discomfort which has been unusual for her even compared to prior times and she's had infections. 08/19/17 put evaluation today patient tells me that she's been having a lot of what sounds to be neuropathic type pain in regard to her left lower extremity. She has been using over-the-counter topical bins again which some believe. That in order to apply the she actually remove the wrap we put on her last Wednesday on Thursday. Subsequently she has not had anything on compression wise since that time. The good news is a lot of the weeping areas appear to have closed at this point again I believe she would do better with compression but we are struggling to get her to actually use what she needs to at this point. No fevers, chills, nausea, or vomiting noted at this time. 09/03/17 on evaluation today patient appears to be doing okay in regard to her lower extremities in regard to the lymphedema and weeping. Fortunately she does not seem to show any signs of infection at this point she does have a little bit of weeping occurring in the right medial malleolus area. With that being said this does not appear to be too significant which is good news. 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 She has severe skin damage secondary to both of these features involving the dorsal left foot and medial left ankle and lower leg. Still has open areas in the left anterior foot. The area on the right closed over. She uses her own juxta light stockings. She does not have an arterial issue 09/16/17 on evaluation today patient actually appears to be doing excellent in regard to her bilateral lower extremity swelling. The Juxta-Lite compression wrap seem to be doing very well for her. She has not however been using the portion that goes over her foot. Her left foot still is draining a  little bit not nearly as significant as it has been in the past but still I do believe that she likely needs to utilize the full wrap including the foot portion of this will improve as well. She also has been apparently putting on a significant amount of Vaseline which also think is not helpful for her. I recommended that if she feels she needs something for moisturizer Eucerin will probably be better. 09/30/17 on evaluation today patient presents with several new open areas in regard to her left lower extremity although these appear to be minimal and mainly seem to be more moisture breakdown than anything. Fortunately she does not seem to have any evidence of infection which is great news. She has been tolerating the dressing changes without complication we are using silver alginate on the foot she has been using AB pads to have the legs and using her Juxta- Lite compression which seems to be controlling her swelling very well. Overall I'm pleased with the poor way she has progressed. 10/14/17 on evaluation today patient appears to be doing better in regard to her left lower extremity areas of weeping. She does still have some discomfort although in general this does not appear to be as macerated and I think it is progressing  nicely. I do think she still needs to wear the foot portion of her Juxta- Lite in order to get the most benefit from the wrap obviously. She states she understands. Fortunately there does not appear to be evidence of infection at this time which is great news. 10/28/17 on evaluation today patient appears to be doing excellent in regard to her left lower extremity. She has just a couple areas that are still open and seem to be causing any trouble whatsoever. For that reason I think that she is definitely headed in the right direction the spots are very tiny compared to what we have been dealing with in the past. 11/11/17 on evaluation today patient appears to have a right lateral lower extremity ulcer that has opened since I last saw her. She states this is where the home health nurse that was coming out remove the dressing without wetting the alginate first. Nonetheless I do not know if this is indeed the case or not but more importantly we have not ordered home help to be coming out for her wounds at all. I'm unsure as to why they are coming out and we're gonna have to check on this and get things situated in that regard. With that being said we currently really do not need them to be coming out as the patient has been taking care of her leg herself without complication and no issues. In fact she was doing much better prior to nursing coming out. 11/25/17 on evaluation today patient actually appears to be doing fairly well in regard to her left lower extremity swelling. In fact she has very little area of weeping at this point there's just a small spot on the lateral portion of her right leg that still has me just a little bit more concerned as far as wanting to see this clear up before I discharge her to caring for this at home. Nonetheless overall she has made excellent progress. 12/09/17 on evaluation today patient appears to be doing rather well in regard to her lower extremity edema. She does have  some weeping still in the left lower extremity although the big area we were taking care of two weeks ago actually has closed and she  has another area of weeping on the left lower extremity immediately as well is the top of her foot. She does not currently have lymphedema pumps she has been wearing her compression daily on a regular basis as directed. With that being said I think she may benefit from lymphedema pumps. 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 We had a very difficult time getting and keeping this under control. 12/23/17 on evaluation today patient actually appears to be doing a little bit more poorly in regard to her bilateral lower extremities. She has been tolerating the Juxta-Lite compression wraps. Unfortunately she has two new ulcers on the right lower extremity and left lower Trinity ulceration seems to be larger. Obviously this is not good news. She has been tolerating the dressings without complication. 12/30/17 on evaluation today patient actually appears to be doing much better in regard to her bilateral lower extremity edema. She continues to have some issues with ulcerations and in fact there appears to be one spot on each leg where the wrap may have caused a little bit of a blister which is subsequently opened up at this point is given her pain. Fortunately it does not appear to be any evidence of infection which is good news. No fevers chills noted. 01/13/18 on evaluation today patient appears to be doing rather well in regard to her bilateral lower extremities. The dressings did get kind of stuck as far as the wound beds are concerned but again I think this is mainly due to the fact that she actually seems to be showing signs of healing which is good news. She's not having as much drainage therefore she was having more of  the dressing sticking. Nonetheless overall I feel like her swelling is dramatically down compared to previous. 01/20/18 on evaluation today patient unfortunately though she's doing better in most regards has a large blister on the left anterior lower extremity where she is draining quite significantly. Subsequently this is going to need debridement today in order to see what's underneath and ensure she does not continue to trapping fluid at this location. Nonetheless No fevers, chills, nausea, or vomiting noted at this time. 01/27/18 on evaluation today patient appears to be doing rather well at this point in regard to her right lower extremity there's just a very small area that she still has open at this point. With that being said I do believe that she is tolerating the compression wraps very well in making good progress. Home health is coming out at this point to see her. Her left lower extremity on the lateral portion is actually what still mainly open and causing her some discomfort for the most part 02/10/18 on evaluation today patient actually appears to be doing very well in regard to her right lower extremity were all the ulcers appear to be completely close. In regard to the left lower extremity she does have two areas still open and some leaking from the dorsal surface of her foot but this still seems to be doing much better to me in general. 02/24/18 on evaluation today patient actually appears to be doing much better in regard to her right lower extremity this is still completely healed. Her left lower extremity is also doing much better fortunately she has no evidence of infection. The one area that is gonna require some debridement  is still on the left anterior shin. Fortunately this is not hurting her as badly today. 03/10/18 on evaluation today patient appears to be doing better in some regards although she has a little bit more open area on the dorsal foot and she also has some issues on  the medial portion of the left lower extremity which is actually new and somewhat deep. With that being said there fortunately does not appear to be any significant signs of infection which is good news. No fevers, chills, nausea, or vomiting noted at this time. In general her swelling seems to be doing fairly well which is good news. 03/31/18 on evaluation today patient presents for follow-up concerning her left lower extremity lymphedema. Unfortunately she has been doing a little bit more poorly since I last saw her in regard to the amount of weeping that she is experiencing. She's also having some increased pain in the anterior shin location. Unfortunately I do not feel like the patient is making such good progress at this point a few weeks back she was definitely doing much better. 04/07/18 on evaluation today patient actually appears to be showing some signs of improvement as far as the left lower extremity is concerned. She has been tolerating the dressing changes and it does appear that the Drawtex did better for her. With that being said unfortunately home health is stating that they cannot obtain the Drawtex going forward. Nonetheless we're gonna have to check and see what they may be able to get the alginate they were using was getting stuck in causing new areas of skin being pulled all that with and subsequently weep and calls her to worsen overall this is the first time we've seen improvement at this time. 04/14/18 on evaluation today patient actually appears to be doing rather well at this point there does not appear to be any evidence of infection at this time and she is actually doing excellent in regard to the weeping in fact she almost has no openings remaining even compared to just last week this is a dramatic improvement. No fevers chills noted 04/21/18 evaluation today patient actually appears to be doing very well. She in fact is has a small area on the posterior lower extremity  location and she has a small area on the dorsal surface of her foot that are still open both of which are very close to closing. We're hoping this will be close shortly. She brought her Juxta-Lite wrap with her today hoping that would be able to put her in it unfortunately I don't think were quite at that point yet but we're getting closer. 04/28/18 upon evaluation today patient actually appears to be doing excellent in regard to her left lower extremity ulcer. In fact the region on the posterior lower extremity actually is much smaller than previously noted. Overall I'm very happy with the progress she has made. She again did bring her Juxta-Lite although we're not quite ready for that yet. 05/11/18 upon evaluation today patient actually appears to be doing in general fairly well in regard to her left lower Trinity. The swelling is very well controlled. With that being said she has a new area on the left anterior lower extremity as well as between the first and second toes of her left foot that was not present during the last evaluation. The region of her posterior left lower extremity actually appears to be almost completely healed. T be honest I'm very pleased with o the way that stands. Nonetheless  I do believe that the lotion may be keeping the area to moist as far as her legs are concerned subsequently I'm gonna consider discontinuing that today. 05/26/18 on evaluation today patient appears to be doing rather well in regard to her left lower should be ulcers. In fact everything appears to be close except for a very small area on the left posterior lower extremity. Fortunately there does not appear to be any evidence of infection at this time. Overall very pleased with her progress. 06/02/18 and evaluation today patient actually appears to be doing very well in regard to her lower extremity ulcers. She has one small area that still continues to weep that I think may benefit her being able to  justify lotion and user Juxta-Lite wraps versus continued to wrap her. Nonetheless I think this is something we can definitely look into at this point. 06/23/18 on evaluation today patient unfortunately has openings of her bilateral lower extremities. In general she seems to be doing much worse than when I last saw her just as far as her overall health standpoint is concerned. She states that her discomfort is mainly due to neuropathy she's not having any other issues otherwise. No fevers, chills, nausea, or vomiting noted at this time. 06/30/18 on evaluation today patient actually appears to be doing a little worse in regard to her right lower extremity her left lower extremity of doing fairly well. Fortunately there is no sign of infection at this time. She has been tolerating the dressing changes without complication. Home health did not come out like they were supposed to for the appropriate wrap changes. They stated that they never received the orders from [place] which were fax. Nonetheless we will send a copy of the orders with the patient today as well. 07/07/18 on evaluation today patient appears to be doing much better in regard to lower extremities. She still has several openings bilaterally although since I last saw her her legs did show obvious signs of infection when she later saw her nurse. Subsequently a culture was obtained and she is been placed on Bactrim and Keflex. Fortunately things seem to be looking much better it does appear she likely had an infection. Again last week we'd even discussed it but again there really was not any obvious sign that she had infection therefore we held off on the antibiotics. Nonetheless I'm glad she's doing better today. 07/14/18 on evaluation today patient appears to be doing much better regarding her bilateral lower Trinity's. In fact on the right lower for me there's nothing open at this point there are some dry skin areas at the sites where she had  infection. Fortunately there is no evidence of systemic infection which is excellent news. No fevers chills noted 07/21/18 on evaluation today patient actually appears to be doing much better in regard to her left lower extremity ulcers. She is making good progress and overall I feel like she's improving each time I see her. She's having no pain I do feel like the infection is completely resolved which is excellent news. No fevers, chills, nausea, or vomiting noted at this time. 07/28/18 on evaluation today patient appears to be doing very well in regard to her left lower [employer]. Everything seems to be showing signs of improvement which is excellent news. Overall very pleased with the progress that has been made. Fortunately there's no evidence of active infection at this time also excellent news. 08/04/18 on evaluation today patient appears to be doing more  poorly in regard to her bilateral lower extremities. She has two new areas open up on the right and these were completely closed as of last week. She still has the two spots on the left which in my pinion seem to be doing better. Fortunately there's no evidence of infection again at this point. 08/11/18 on evaluation today patient actually appears to be doing very well in regard to her bilateral lower Trinity wounds that all seem to be doing better and are measures smaller today. Fortunately there's no signs of infection. No fevers, chills, nausea, or vomiting noted at this time. 08/18/18 on evaluation today patient actually appears to be doing about the same inverter bilateral lower extremities. She continues to have areas that blistering open as was drain that fortunately nothing too significant. Overall I feel like Drawtex may have done better for her however compared to the collagen. 08/25/18 on evaluation today patient appears to be doing a little bit more poorly today even compared to last time I saw her. Again I'm not exactly sure  why she's making worse progress over the past several weeks. I'm beginning to wonder if there is some kind of underlying low level infection causing this issue. I did actually take a culture from the left anterior lower extremity but it was a new wound draining quite a bit at this point. Unfortunately she also seems to be having more pain which is what also makes me worried about the possibility of infection. This is despite never erythema noted at this point. 09/01/18 on evaluation today patient actually appears to be doing a little worse even compared to last week in regard to bilateral lower extremities. She did go to the hospital on the 16th was given a dose of IV Zosyn and then discharged with a recommendation to continue with the Bactrim that I previously prescribed for her. Nonetheless she is still having a lot of discomfort she tells me as well at this time. This is definitely unfortunate. No fevers, chills, nausea, or vomiting noted at this time. 09/08/18 on evaluation today patient's bilateral lower extremities actually appear to be shown signs of improvement which is good news. Fortunately there does not appear to be any signs of active infection I think the anabiotic is helping in this regard. Overall I'm very pleased with how she is progressing. 09/15/18 patient was actually seen in ER yesterday due to her legs as well unfortunately. She states she's been having a lot of pain and discomfort as well as a lot of drainage. Upon inspection today the patient does have a lot of swelling and drainage I feel like this is more related to lymphedema and poor fluid control than it is to infection based on what I'm seeing. The physician in the emergency department also doubted that the patient was having a significant infection nonetheless I see no evidence of infection obvious at this point although I do see evidence of poor fluid control. She still not using a compression pumps, she is not elevating due  to her lift chair as well as her hospital bed being broken, and she really is not keeping her legs up as much as they should be and also has been taking off her wraps. All this combined I think has led to poor fluid control and to be honest she may be somewhat volume overloaded in general as well. I recommend that she may need to contact your physician to see if a prescription for a diuretic would be beneficial  in their opinion. As long as this is safe I think it would likely help her. 09/29/18 on evaluation today patient's left lower extremity actually appears to be doing quite a bit better. At least compared to last time that I saw her. She still has a large area where she is draining from but there's a lot of new skin speckled trout and in fact there's more new skin that there are open areas of weeping and drainage at this point. This is good news. With regard to the right lower extremity this is doing much better with the only open area that I really see being a dry spot on the right lateral ankle currently. Fortunately there's no signs of active infection at this time which is good news. No fevers, chills, nausea, or vomiting noted at this time. The patient seems somewhat stressed and overwhelmed during the visit today she was very lethargic as such. She does and she is not taking any pain medications at this point. Apparently according to her husband are also in the process of moving which is probably taking its toll on her as well. 10/06/18 on evaluation today patient appears to be doing rather well in regard to her lower extremities compared to last evaluation. Fortunately there's no signs of active infection. She tells me she did have an appointment with her primary. Nonetheless he was concerned that the wounds were somewhat deep based on pictures but we never actually saw her legs. She states that he had her somewhat worried due to the fact that she was fearing now that she was [place] have  to have an amputation. With that being said based on what I'm seeing check she looks better this week that she has the last two times I've seen her with much less drainage I'm actually pleased in this regard. That doesn't mean that she's out of the water but again I do not think what the point of talking about education at all in regard to her leg. She is very happy to hear this. She is also not having as much pain as she was having last week. 10/13/18 unfortunately on evaluation today patient still continues to have a significant amount of drainage she's not letting home health actually apply the compression dressings at this point. She's trying to use of Juxta-Lite of the top of Kerlex and the second layer of the three layer compression wrap. With that being said she just does not seem to be making as good a progress as I would expect if she was having the compression applied and in place on a regular basis. No fevers, chills, nausea, or vomiting noted at this time. 10/20/18 on evaluation today patient appears to be doing a little better in regard to her bilateral lower extremity ulcers. In fact the right lower extremity seems to be healed she doesn't even have any openings at this point left lower extremity though still somewhat macerated seems to be showing signs of new skin growth at multiple locations throughout. Fortunately there's no evidence of active infection at this time. No fevers, chills, nausea, or vomiting noted at this time. 10/27/18 on evaluation today patient appears to be doing much better in regard to her left lower Trinity ulcer. She's been tolerating the laptop complication and has minimal drainage noted at this point. Fortunately there's no signs of active infection at this time. No fevers, chills, nausea, or vomiting noted at this time. 11/03/18 on evaluation today patient actually appears to be doing excellent  in regard to her left lower extremity. She is having very little  drainage at this point there does not appear to be any significant signs of infection overall very pleased with how things have gone. She is likewise extremely pleased still and seems to be making wonderful progress week to week. I do believe antibiotics were helpful for her. Her primary care provider did place on amateur clean since I last saw her. 11/17/18 on evaluation today patient appears to be doing worse in regard to her bilateral lower extremities at this point. She is been tolerating the dressing changes without complication. With that being said she typically takes the [place] off fairly quickly upon arriving home even after being seen here in the clinic and does not allow home health reapply command as part of the dressing at home. Therefore she said no compression essentially since I last saw her as best I can tell. With that being said I think it shows and how much swelling she has in the open wounds that are noted at this point. Fortunately there's no signs of infection but unfortunately if she doesn't get this under control I think she will end up with infection and more significant issues. 11/24/18 on evaluation today patient actually appears to be doing somewhat better in regard to her bilateral lower extremities. 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 Nonetheless that is doing better currently so she should be able to attempt reinitiating the lymphedema pumps at this time. No fevers, chills, nausea, or vomiting noted at this time. 12/01/18 upon evaluation today patient's left lower extremity appears to be doing quite well unfortunately her right lower extremity is not doing nearly as well. She has been tolerating the dressing changes without complication unfortunately she did not keep a wrap on the right at this time. Nonetheless I believe  this has led to increased swelling and weeping in the world is actually much larger than during the last evaluation with her. 12/08/18 on evaluation today patient appears to be doing about the same at this point in regard to her right lower extremity. There is some more palatable to touch I'm concerned about the possibility of there being some infection although I think the main issue is she's not keeping her compression wrap on which in turn is not allowing this area to heal appropriately. 12/22/18 on evaluation today patient appears to be doing better in regard to left lower extremity unfortunately significantly worse in regard to the right lower extremity. The areas of blistering and necrotic superficial tissue have spread and again this does not really appear to be signs of infection and all she just doesn't seem to be doing nearly as well is what she has been in the past. Overall I feel like the Augmentin did absolutely nothing for her she doesn't seem to have any infection again I really didn't think so last time either is more of a potential preventative measure and hoping that this would make some difference but I think the main issue is she's not wearing her compression. She tells me she cannot wear the [place] been we put on she takes it off pretty much upon getting home. Subsequently she worshiped Juxta-Lite when I questioned her about how often she wears it this is no more than three hours a day obviously  that leaves 21 hours that she has no compression and this is obviously not doing well for her. Overall I'm concerned that if things continue to worsen she is at great risk of both infection as well as losing her leg. 01/05/19 on evaluation today patient appears to be doing well in regard to her left lower extremity which he is allowing [place] to wrap and not so well with regard to her right lower extremity which she is not allowing [place] to really wrap and keep the wrap on. She states that it hurts  too badly whenever it's wrapped and she ends up having to take it off. She's been using the Juxta-Lite she tells me up to six hours a day although I question whether or not that's really been the case to be honest. Previously she told me three hours today nonetheless obviously the legs as long as the wrap is doing great when she is not is doing much more poorly. 01/12/2019 on evaluation today patient actually appears to be doing a little better in my opinion with regard to her right lower extremity ulcer. She has a small open area on the left lower extremity unfortunately but again this I think is part of the normal fluctuation of what she is going to have to expect with regard to her legs especially when she is not using her lymphedema pumps on a regular basis. Subsequently based on what I am seeing today I think that she does seem to be doing slightly better with regard to her right lower extremity she did see her primary care provider on Monday they felt she had an infection and placed her on 2 antibiotics. Both Cipro and clindamycin. Subsequently again she seems possibly to be doing a little bit better in regards to the right lower extremity she also tells me however she has been wearing the compression wrap over the past week since I spoke with her as well that is a Kerlix and Coban wrap on the right. No fevers, chills, nausea, vomiting, or diarrhea. 01/19/2019 on evaluation today patient appears to be doing better with regard to her bilateral lower extremities especially the right. I feel like the compression has been beneficial for her which is great news. She did get a call from her primary care provider on her way here today telling her that she did have methicillin- resistant Staphylococcus aureus and he was calling in a couple new antibiotics for her including a ointment to be applied she tells me 3 times a day. With that being said this sounds like likely to be Bactroban which I think could be  applied with each dressing/wrap change but I would not be able to accommodate her applying this 3 times a day. She is in agreement with the least doing this we will add that to her orders today. 01/26/2019 on evaluation today patient actually appears to be doing much better with regard to her right lower extremity. Her left lower extremity is also doing quite well all things considering. Fortunately there is no evidence of active infection at this time. No fevers, chills, nausea, vomiting, or diarrhea. 02/02/2019 on evaluation today patient appears to be doing much better compared to her last evaluation. Little by little off like her right leg is returning more towards normal. There does not appear to be any signs of active infection and overall she seems to be doing quite well which is great news. I am very pleased in this regard. No fevers, chills, nausea,  vomiting, or diarrhea. 02/09/2019 upon evaluation today patient appears to be doing better with regard to her bilateral lower extremities. She has been tolerating the dressing changes without complication. Fortunately there is no signs of active infection at this time. No fevers, chills, nausea, vomiting, or diarrhea. 02/23/2019 on evaluation today patient actually appears to be doing quite well with regard to her bilateral lower extremities. She has been tolerating the dressing changes without complication. She is even used her pumps one time and states that she really felt like it felt good. With that being said she seems to be in good spirits and her legs appear to be doing excellent. 03/09/2019 on evaluation today patient appears to be doing well with regard to her right lower extremity there are no open wounds at this time she is having some discomfort but I feel like this is more neuropathy than anything. With regard to her left lower extremity she had several areas scattered around that she does have some weeping and drainage from but again  overall she does not appear to be having any significant issues and no evidence of infection at this time which is good news. 03/23/2019 on evaluation today patient appears to be doing well with regard to her right lower extremity which she tells me is still close she is using her juxta light here. Her left lower extremity she mainly just has an area on the foot which is still slightly draining although this also is doing great. Overall very pleased at this time. 04/06/2019 patient appears to be doing a little bit worse in regard to her left lower extremity upon evaluation today. She feels like this could be becoming infected again which she had issues with previous. Fortunately there is no signs of systemic infection but again this is always a struggle with her with her legs she will go from doing well to not so well in a very short amount of time. 04/20/2019 on evaluation today patient actually appears to be doing quite well with regard to her right lower extremity I do not see any signs of active infection at this time. Fortunately there is no fever chills noted. She is still taking the antibiotics which I prescribed for her at this point. In regard to the left lower extremity I do feel like some of these areas are better although again she still is having weeping from several locations at this time. 04/27/2019 on evaluation today patient appears to be doing about the same if not slightly worse in regard to her left lower extremity ulcers. She tells me when questioned that she has been sleeping in her Hoveround chair in fact she tells me she falls asleep without even knowing it. I think she is spending a whole lot of time in the chair and less time walking and moving around which is not good for her legs either. On top of that she is in a seated position which is also the worst position she is not really elevating her legs and she is also not using her lymphedema pumps. All this is good to contribute  to worsening of her condition in general. 05/18/2019 on evaluation today patient appears to be doing well with regard to her lower extremity on the right in fact this is showing no signs of any open wounds at this time. On the left she is continuing to have issues with areas that do drain. Some of the regions have healed and there are couple areas that have reopened.  She did go to the ER per the patient according to recommendations from the home health nurse due to what she was seen when she came out on 05/13/2019. Subsequently she felt like the patient needed to go to the hospital due to the fact that again she was having "milky white discharge" from her leg. Nonetheless she had and then was placed on doxycycline and subsequently seems to be doing better. 06/01/2019 upon evaluation today patient appears to be doing really in my opinion about the same. I do not see any signs of active infection which is good news. Overall she still has wounds over the bilateral lower extremities she has reopened on the right but this appears to be more of a crack where there is weeping/edema coming from the region. I do not see any evidence of infection at either site based on what I visualized today. 07/13/2019 upon evaluation today patient appears to be doing a little worse compared to last time I saw her. She since has been in the hospital from 06/21/2019 through 06/29/2019. This was secondary to having Covid. During that time they did apply lotion to her legs which unfortunately has caused her to develop a myriad of open wounds on her lower extremities. Her legs do appear to be doing better as far as the overall appearance is concerned but nonetheless she does have more open and weeping areas. 07/27/2019 upon evaluation today patient appears to be doing more poorly to be honest in regard to her left lower extremity in particular. There is no signs of systemic infection although I do believe she may have local  infection. She notes she has been having a lot of blue/green drainage which is consistent potentially with Pseudomonas. That may be something that we need to consider here as well. The doxycycline does not seem to have been helping. 08/03/2019 upon evaluation today patient appears to be doing a little better in my opinion compared to last week's evaluation. Her culture I did review today and she is on appropriate medications to help treat the Enterobacter that was noted. Overall I feel like that is good news. With that being said she is unfortunately continuing to have a lot of drainage and though it is doing better I still think she has a long ways to go to get things dried up in general. Fortunately there is no signs of systemic infection. 08/10/2019 upon evaluation today patient appears to be doing may be slightly better in regard to her left lower extremity the right lower extremity is doing much better. Fortunately there is no signs of infection right now which is good news. No fevers, chills, nausea, vomiting, or diarrhea. 08/24/2019 on evaluation today patient appears to be doing slightly better in regard to her lower extremities. The left lower extremity seems to be healed the right lower extremity is doing better though not completely healed as far as the openings are concerned. She has some generalized issues here with edema and weeping secondary to her lymphedema though again I do believe this is little bit drier compared to prior weeks evaluations. In general I am very pleased with how things seem to be progressing. No fevers, chills, nausea, vomiting, or diarrhea. 08/31/2019 upon evaluation today patient actually seems to making some progress here with regard to the left lower extremity in particular. She has been tolerating the dressing changes without complication. Fortunately there is no signs of active infection at this time. No fevers, chills, nausea, vomiting, or diarrhea. She did  see  Dr. Doren Custard and he did note that she did have a issue with the left great saphenous vein and the small saphenous vein in the leg. With that being said he was concerned about the possibility of laser ablation not being extremely successful. He also mentioned a small risk of DVT associated with the procedure. However if the wounds do not continue to improve he stated that that would probably be the way to go. Fortunately the patient's legs do seem to be doing much better. 09/07/2019 upon evaluation today patient appears to be doing better with regard to her lower extremities. She has been tolerating the dressing changes without complication. With that being said she is showing signs of improvement and overall very pleased. There are some areas on her leg that I think we do need to debride we discussed this last week the patient is in agreement with doing that as long as it does not hurt too badly. 09/14/2019 upon evaluation today patient appears to be doing decently well with regard to her left lower extremity. She is not having near as much weeping as she has had in the past things seem to be drying up which is good news. There is no signs of active infection at this time. 09/21/19 upon evaluation today patient appears to be doing better in regard overall to her bilateral lower extremities. She again has less open than she did previous and each week I feel like this is getting better. Fortunately there is no signs of active infection at this time. No fevers, chills, nausea, vomiting, or diarrhea. 09/28/2019 upon evaluation today patient actually appears to be showing signs of improvement with regard to her left lower extremity. Unfortunately the right medial lower extremity around the ankle region has reopened to some degree but this appears to be minimal still which is good news. There is no signs of active infection at this time which is also good news. 10/12/2019 upon evaluation today patient appears to be  doing okay with regard to her bilateral lower extremities today. The right is a little bit worse then last evaluation 2 weeks ago. The left is actually doing a little better in my opinion. Overall there is no signs of active infection at this time that I see. Obviously that something we have to keep a close eye on she is very prone to this with the significant and multiple openings that she has over the bilateral lower extremities. Objective Constitutional Obese and well-hydrated in no acute distress. Vitals Time Taken: 11:26 AM, Height: 62 in, Weight: 335 lbs, BMI: 61.3, Temperature: 97.7 F, Pulse: 89 bpm, Respiratory Rate: 18 breaths/min, Blood Pressure: 160/78 mmHg. Respiratory normal breathing without difficulty. Psychiatric this patient is able to make decisions and demonstrates good insight into disease process. Alert and Oriented x 3. pleasant and cooperative. General Notes: Upon inspection patient again had some areas of breakdown over the bilateral lower extremities but there does not appear to be any evidence of active infection at this time which is good news. No fevers, chills, nausea, vomiting, or diarrhea. Integumentary (Hair, Skin) Wound #61 status is Open. Original cause of wound was Gradually Appeared. The wound is located on the Left,Circumferential Lower Leg. The wound measures 12.5cm length x 25cm width x 0.1cm depth; 245.437cm^2 area and 24.544cm^3 volume. There is Fat Layer (Subcutaneous Tissue) Exposed exposed. There is no tunneling or undermining noted. There is a large amount of serous drainage noted. The wound margin is flat and intact. There is  small (1- 33%) pink, pale granulation within the wound bed. There is a large (67-100%) amount of necrotic tissue within the wound bed including Adherent Slough. Wound #64 status is Open. Original cause of wound was Gradually Appeared. The wound is located on the Left,Dorsal Foot. The wound measures 7cm length x 7.5cm width x  0.1cm depth; 41.233cm^2 area and 4.123cm^3 volume. There is Fat Layer (Subcutaneous Tissue) Exposed exposed. There is no tunneling or undermining noted. There is a medium amount of serosanguineous drainage noted. The wound margin is flat and intact. There is small (1-33%) pink, pale granulation within the wound bed. There is a large (67-100%) amount of necrotic tissue within the wound bed including Adherent Slough. Wound #65 status is Open. Original cause of wound was Gradually Appeared. The wound is located on the Right,Medial Lower Leg. The wound measures 2.5cm length x 2.5cm width x 0.1cm depth; 4.909cm^2 area and 0.491cm^3 volume. There is Fat Layer (Subcutaneous Tissue) Exposed exposed. There is no tunneling or undermining noted. There is a medium amount of serosanguineous drainage noted. The wound margin is distinct with the outline attached to the wound base. There is small (1-33%) red, pink granulation within the wound bed. There is a large (67-100%) amount of necrotic tissue within the wound bed including Adherent Slough. 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 There was a Three Layer Compression Therapy Procedure by Levan Hurst,  RN. Post procedure Diagnosis Wound #61: Same as Pre-Procedure Wound #64 Pre-procedure diagnosis of Wound #64 is a Diabetic Wound/Ulcer of the Lower Extremity located on the Left,Dorsal Foot . There was a Three Layer Compression Therapy Procedure by Levan Hurst, RN. Post procedure Diagnosis Wound #64: Same as Pre-Procedure Wound #65 Pre-procedure diagnosis of Wound #65 is a Venous Leg Ulcer located on the Right,Medial Lower Leg . There was a Three Layer Compression Therapy Procedure by Levan Hurst, RN. Post procedure Diagnosis Wound #65: Same as Pre-Procedure Plan Follow-up Appointments: Return Appointment in 1 week. Dressing Change Frequency: Wound #61 Left,Circumferential Lower Leg: Change dressing three times week. Wound #64 Left,Dorsal Foot: Change dressing three times week. Wound #65 Right,Medial Lower Leg: Change dressing three times week. Skin Barriers/Peri-Wound Care: Barrier cream - zinc oxide cream to any macerated areas Moisturizing lotion Wound Cleansing: Clean wound with Wound Cleanser - all wounds May shower with protection. Primary Wound Dressing: Wound #61 Left,Circumferential Lower Leg: Calcium Alginate with Silver Wound #64 Left,Dorsal Foot: Calcium Alginate with Silver Wound #65 Right,Medial Lower Leg: Calcium Alginate with Silver Secondary Dressing: Wound #61 Left,Circumferential Lower Leg: Dry Gauze - all wounds ABD pad - as needed Zetuvit or Kerramax - as needed for excess drainage Wound #64 Left,Dorsal Foot: Dry Gauze - all wounds ABD pad - as needed Wound #65 Right,Medial Lower Leg: Dry Gauze Edema Control: 3 Layer Compression System - Bilateral Avoid standing for long periods of time -  walking is encouraged Elevate legs to the level of the heart or above for 30 minutes daily and/or when sitting, a frequency of: - do not sleep in chair with feet dangling, MUST elevate legs while sitting Exercise regularly Segmental Compressive Device. -  lymphedema pumps 60 minutes 1- 2 times per day Off-Loading: Turn and reposition every 2 hours Additional Orders / Instructions: Follow Nutritious Diet - T include vitamin A, vitamin C, and Zinc along with increased protein intake. o Home Health: [facility] for wound [facility]. - [facility]. My suggestion at this time is good to be that we go ahead and continue with the silver alginate dressing to all wound locations. Things are finally starting to dry out more in regard to this left lower extremity. Again this is the best case scenario being that when she is very wet and macerated she tends to have a lot of breakdown and then ends up with infection. 2. I am also can recommend that we wrap both legs with a 3 layer compression wrap. Again we have been using the juxta lite on the right but I think at this point a wrap is warranted. 3. I do suggest she continue to elevate her legs she needs to be using her lymphedema pumps for 60 minutes each time 1-2 times per day preferably to. She still has not really been doing it at all. We will see patient back for reevaluation in 1 week here in the clinic. If anything worsens or changes patient will contact our office for additional recommendations. Electronic Signature(s) Signed: 10/12/2019 1:35:41 PM By: [name] PA-C Entered By: [name] on 10/12/2019 13:35:41 -------------------------------------------------------------------------------- SuperBill Details Patient Name: Date of Service: [name] 10/12/2019 Medical Record Number: [id number] Patient Account Number: [id number] Date of Birth/Sex: Treating RN: [birth year] (69 y.o. [name] Primary Care Provider: [name] Other Clinician: Referring Provider: Treating Provider/Extender: Doyle Askew, FRED Weeks in Treatment: 114 Diagnosis Coding ICD-10 Codes Code Description E11.622 Type 2 diabetes mellitus with other skin ulcer I89.0  Lymphedema, not elsewhere classified I87.331 Chronic venous hypertension (idiopathic) with ulcer and inflammation of right lower extremity I87.332 Chronic venous hypertension (idiopathic) with ulcer and inflammation of left lower extremity L97.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 leg with fat layer exposed L97.822 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 I10 Essential (primary) hypertension E66.01 Morbid (severe) obesity due to excess calories F41.8 Other specified anxiety disorders R53.1 Weakness Physician Procedures : CPT4 Code Description Modifier [id number] - WC PHYS LEVEL 3 - EST PT ICD-10 Diagnosis Description E11.622 Type 2 diabetes mellitus with other skin ulcer I89.0 Lymphedema, not elsewhere classified I87.331 Chronic venous hypertension (idiopathic) with  ulcer and inflammation of right lower extremity I87.332 Chronic venous hypertension (idiopathic) with ulcer and inflammation of left lower extremity Quantity: 1 Electronic Signature(s) Signed: 10/12/2019 1:38:13 PM By: [name] PA-C Entered By: [name] on 10/12/2019 13:38:08

## 2019-10-17 NOTE — Progress Notes (Signed)
[name], [name] ([id number]) Visit Report for 10/12/2019 Arrival Information Details Patient Name: Date of Service: [name], [name] 10/12/2019 10:30 A M Medical Record Number: [id number] Patient Account Number: [id number] Date of Birth/Sex: Treating RN: 1950/08/06 (69 y.o. [name] Primary Care : [name] Other Clinician: Referring : Treating /Extender: Doyle Askew, FRED Weeks in Treatment: 114 Visit Information History Since Last Visit All ordered tests and consults were completed: No Patient Arrived: Wheel Chair Added or deleted any medications: No Arrival Time: 11:25 Any new allergies or adverse reactions: No Accompanied By: self Had a fall or experienced change in No Transfer Assistance: None activities of daily living that may affect Patient Identification Verified: Yes risk of falls: Secondary Verification Process Completed: Yes Signs or symptoms of abuse/neglect since last visito No Patient Requires Transmission-Based Precautions: No Hospitalized since last visit: No Patient Has Alerts: Yes Implantable device outside of the clinic excluding No Patient Alerts: R ABI= 1.01 cellular tissue based products placed in the center L ABI = .99 since last visit: Has Dressing in Place as Prescribed: Yes Has Compression in Place as Prescribed: Yes Pain Present Now: No Electronic Signature(s) Signed: 10/17/2019 5:02:18 PM By: Carlene Coria RN Entered By: Carlene Coria on 10/12/2019 11:26:03 -------------------------------------------------------------------------------- Compression Therapy Details Patient Name: Date of Service: [name]. 10/12/2019 10:30 A M Medical Record Number: [id number] Patient Account Number: [id number] Date of Birth/Sex: Treating RN: 1950-08-14 (69 y.o. [name] Primary Care : [name] Other Clinician: Referring : Treating /Extender: Doyle Askew, FRED Weeks in  Treatment: 114 Compression Therapy Performed for Wound Assessment: Wound #61 Left,Circumferential Lower Leg Performed By: Clinician Levan Hurst, RN Compression Type: Three Layer Post Procedure Diagnosis Same as Pre-procedure Electronic Signature(s) Signed: 10/12/2019 5:55:37 PM By: Levan Hurst RN, BSN Entered By: Levan Hurst on 10/12/2019 11:57:50 -------------------------------------------------------------------------------- Compression Therapy Details Patient Name: Date of Service: [name]. 10/12/2019 10:30 A M Medical Record Number: [id number] Patient Account Number: [id number] Date of Birth/Sex: Treating RN: 1951-04-04 (69 y.o. [name] Primary Care : [name] Other Clinician: Referring : Treating /Extender: Doyle Askew, FRED Weeks in Treatment: 114 Compression Therapy Performed for Wound Assessment: Wound #64 Left,Dorsal Foot Performed By: Clinician Levan Hurst, RN Compression Type: Three Layer Post Procedure Diagnosis Same as Pre-procedure Electronic Signature(s) Signed: 10/12/2019 5:55:37 PM By: Levan Hurst RN, BSN Entered By: Levan Hurst on 10/12/2019 11:57:50 -------------------------------------------------------------------------------- Compression Therapy Details Patient Name: Date of Service: [name]. 10/12/2019 10:30 A M Medical Record Number: [id number] Patient Account Number: [id number] Date of Birth/Sex: Treating RN: 11-07-1950 (69 y.o. [name] Primary Care : [name] Other Clinician: Referring : Treating /Extender: Doyle Askew, FRED Weeks in Treatment: 114 Compression Therapy Performed for Wound Assessment: Wound #65 Right,Medial Lower Leg Performed By: Clinician Levan Hurst, RN Compression Type: Three Layer Post Procedure Diagnosis Same as Pre-procedure Electronic Signature(s) Signed: 10/12/2019 5:55:37 PM By: Levan Hurst RN, BSN Entered By: Levan Hurst on 10/12/2019 11:57:50 -------------------------------------------------------------------------------- Encounter Discharge Information Details Patient Name: Date of Service: [name]. 10/12/2019 10:30 A M Medical Record Number: [id number] Patient Account Number: [id number] Date of Birth/Sex: Treating RN: 1950-10-29 (69 y.o. [name] Primary Care : [name] Other Clinician: Referring : Treating /Extender: Doyle Askew, FRED Weeks in Treatment: [phone] Encounter Discharge Information Items Discharge Condition: Stable Ambulatory Status: Wheelchair Discharge Destination: Home Transportation: Private Auto Accompanied By: self Schedule Follow-up Appointment: Yes Clinical Summary of Care: Patient  Declined Electronic Signature(s) Signed: 10/17/2019 5:02:18 PM By: Carlene Coria RN Entered By: Carlene Coria on 10/12/2019 12:16:38 -------------------------------------------------------------------------------- Lower Extremity Assessment Details Patient Name: Date of Service: [name], [name] 10/12/2019 10:30 A M Medical Record Number: [id number] Patient Account Number: [id number] Date of Birth/Sex: Treating RN: 08-01-1950 (69 y.o. [name] Primary Care Alysabeth Scalia: [name] Other Clinician: Referring Lamiah Marmol: Treating Glyndon Tursi/Extender: Doyle Askew, FRED Weeks in Treatment: 114 Edema Assessment Assessed: [Left: No] [Right: No] Edema: [Left: Yes] [Right: Yes] Calf Left: Right: Point of Measurement: 37 cm From Medial Instep 42 cm 38 cm Ankle Left: Right: Point of Measurement: 10 cm From Medial Instep 32 cm 25 cm Electronic Signature(s) Signed: 10/17/2019 5:02:18 PM By: Carlene Coria RN Entered By: Carlene Coria on 10/12/2019 11:27:04 -------------------------------------------------------------------------------- [place] Details Patient Name: Date of  Service: [name]. 10/12/2019 10:30 A M Medical Record Number: [id number] Patient Account Number: [id number] Date of Birth/Sex: Treating RN: [birth year] (69 y.o. [name] Primary Care Trevaun Rendleman: [name] Other Clinician: Referring Latarra Eagleton: Treating Seham Gardenhire/Extender: Doyle Askew, FRED Weeks in Treatment: [phone] Active Inactive Venous Leg Ulcer Nursing Diagnoses: Actual venous Insuffiency (use after diagnosis is confirmed) Knowledge deficit related to disease process and management Goals: Patient will maintain optimal edema control Date Initiated: 08/12/2017 Target Resolution Date: 10/19/2019 Goal Status: Active Patient/caregiver will verbalize understanding of disease process and disease management Date Initiated: 08/12/2017 Date Inactivated: 04/21/2018 Target Resolution Date: 04/24/2018 Goal Status: Met Interventions: Assess peripheral edema status every visit. Compression as ordered Treatment Activities: Therapeutic compression applied : 08/12/2017 Notes: Wound/Skin Impairment Nursing Diagnoses: Impaired tissue integrity Knowledge deficit related to ulceration/compromised skin integrity Goals: Patient/caregiver will verbalize understanding of skin care regimen Date Initiated: 08/12/2017 Target Resolution Date: 10/19/2019 Goal Status: Active Ulcer/skin breakdown will have a volume reduction of 30% by week 4 Date Initiated: 08/05/2017 Date Inactivated: 09/30/2017 Target Resolution Date: 10/03/2017 Goal Status: Met Ulcer/skin breakdown will have a volume reduction of 50% by week 8 Date Initiated: 09/30/2017 Date Inactivated: 10/28/2017 Target Resolution Date: 10/28/2017 Goal Status: Met Interventions: Assess patient/caregiver ability to perform ulcer/skin care regimen upon admission and as needed Assess ulceration(s) every visit Provide education on ulcer and skin care Screen for HBO Treatment Activities: Patient referred to home care : 08/05/2017 Skin  care regimen initiated : 08/05/2017 Topical wound management initiated : 08/05/2017 Notes: Electronic Signature(s) Signed: 10/12/2019 5:55:37 PM By: Levan Hurst RN, BSN Entered By: Levan Hurst on 10/12/2019 11:52:18 -------------------------------------------------------------------------------- Pain Assessment Details Patient Name: Date of Service: [name]. 10/12/2019 10:30 A M Medical Record Number: [id number] Patient Account Number: [id number] Date of Birth/Sex: Treating RN: 10/19/1950 (69 y.o. [name] Primary Care Abbegail Matuska: [name] Other Clinician: Referring Stephene Alegria: Treating Stepfanie Yott/Extender: Doyle Askew, FRED Weeks in Treatment: [phone] Active Problems Location of Pain Severity and Description of Pain Patient Has Paino Yes Site Locations With Dressing Change: Yes Duration of the Pain. Constant / Intermittento Constant Rate the pain. Current Pain Level: 6 Worst Pain Level: 8 Least Pain Level: 4 Tolerable Pain Level: 5 Character of Pain Describe the Pain: Burning Pain Management and Medication Current Pain Management: Medication: Yes Cold Application: No Rest: Yes Massage: No Activity: No T.E.N.S.: No Heat Application: No Leg drop or elevation: No Is the Current Pain Management Adequate: Inadequate How does your wound impact your activities of daily livingo Sleep: Yes Bathing: No Appetite: Yes Relationship With Others: No Bladder Continence: No Emotions: No Bowel Continence: No Work: No Toileting: No Drive:  No Dressing: No Hobbies: No Electronic Signature(s) Signed: 10/17/2019 5:02:18 PM By: Carlene Coria RN Entered By: Carlene Coria on 10/12/2019 11:26:58 -------------------------------------------------------------------------------- Patient/Caregiver Education Details Patient Name: Date of Service: [name] 4/28/2021andnbsp10:30 [place] Record Number: [id number] Patient Account Number: [id number] Date of  Birth/Gender: Treating RN: 12-16-1950 (69 y.o. [name] Primary Care Physician: [name] Other Clinician: Referring Physician: Treating Physician/Extender: Doyle Askew, FRED Weeks in Treatment: [phone] Education Assessment Education Provided To: Patient Education Topics Provided Wound/Skin Impairment: Methods: Explain/Verbal Responses: State content correctly Electronic Signature(s) Signed: 10/12/2019 5:55:37 PM By: Levan Hurst RN, BSN Entered By: Levan Hurst on 10/12/2019 11:52:33 -------------------------------------------------------------------------------- Wound Assessment Details Patient Name: Date of Service: [name]. 10/12/2019 10:30 A M Medical Record Number: [id number] Patient Account Number: [id number] Date of Birth/Sex: Treating RN: Feb 17, 1951 (69 y.o. [name] Primary Care : [name] Other Clinician: Referring : Treating /Extender: Doyle Askew, FRED Weeks in Treatment: 114 Wound Status Wound Number: 61 Primary Venous Leg Ulcer Etiology: Wound Location: Left, Circumferential Lower Leg Wound Open Wounding Event: Gradually Appeared Status: Date Acquired: 04/20/2019 Comorbid Asthma, Hypertension, Peripheral Arterial Disease, Peripheral Weeks Of Treatment: 25 History: Venous Disease, Type II Diabetes, Gout, Osteoarthritis Clustered Wound: No Photos Photo Uploaded By: Mikeal Hawthorne on 10/13/2019 10:45:05 Wound Measurements Length: (cm) 12.5 Width: (cm) 25 Depth: (cm) 0.1 Area: (cm) 245.437 Volume: (cm) 24.544 % Reduction in Area: -3155.1% % Reduction in Volume: -3155.2% Epithelialization: Small (1-33%) Tunneling: No Undermining: No Wound Description Classification: Full Thickness Without Exposed Support Structures Wound Margin: Flat and Intact Exudate Amount: Large Exudate Type: Serous Exudate Color: amber Foul Odor After Cleansing: No Slough/Fibrino Yes Wound  Bed Granulation Amount: Small (1-33%) Exposed Structure Granulation Quality: Pink, Pale Fascia Exposed: No Necrotic Amount: Large (67-100%) Fat Layer (Subcutaneous Tissue) Exposed: Yes Necrotic Quality: Adherent Slough Tendon Exposed: No Muscle Exposed: No Joint Exposed: No Bone Exposed: No Treatment Notes Wound #61 (Left, Circumferential Lower Leg) 1. Cleanse With Wound Cleanser Soap and water 3. Primary Dressing Applied Calcium Alginate Ag 4. Secondary Dressing ABD Pad Dry Gauze 6. Support Layer Applied 3 layer compression wrap Electronic Signature(s) Signed: 10/17/2019 5:02:18 PM By: Carlene Coria RN Entered By: Carlene Coria on 10/12/2019 11:28:27 -------------------------------------------------------------------------------- Wound Assessment Details Patient Name: Date of Service: [name], [name] 10/12/2019 10:30 A M Medical Record Number: [id number] Patient Account Number: [id number] Date of Birth/Sex: Treating RN: 1951-01-13 (69 y.o. [name] Primary Care : [name] Other Clinician: Referring : Treating /Extender: Doyle Askew, FRED Weeks in Treatment: 114 Wound Status Wound Number: 64 Primary Diabetic Wound/Ulcer of the Lower Extremity Etiology: Wound Location: Left, Dorsal Foot Wound Open Wounding Event: Gradually Appeared Status: Date Acquired: 06/01/2019 Comorbid Asthma, Hypertension, Peripheral Arterial Disease, Peripheral Weeks Of Treatment: 19 History: Venous Disease, Type II Diabetes, Gout, Osteoarthritis Clustered Wound: No Photos Photo Uploaded By: Mikeal Hawthorne on 10/13/2019 10:45:06 Wound Measurements Length: (cm) 7 Width: (cm) 7.5 Depth: (cm) 0.1 Area: (cm) 41.233 Volume: (cm) 4.123 % Reduction in Area: -24889.7% % Reduction in Volume: -8314.3% Epithelialization: Small (1-33%) Tunneling: No Undermining: No Wound Description Classification: Grade 1 Wound Margin: Flat and Intact Exudate  Amount: Medium Exudate Type: Serosanguineous Exudate Color: red, brown Foul Odor After Cleansing: No Slough/Fibrino Yes Wound Bed Granulation Amount: Small (1-33%) Exposed Structure Granulation Quality: Pink, Pale Fascia Exposed: No Necrotic Amount: Large (67-100%) Fat Layer (Subcutaneous Tissue) Exposed: Yes Necrotic Quality: Adherent Slough Tendon Exposed: No Muscle Exposed: No Joint Exposed: No Bone  Exposed: No Treatment Notes Wound #64 (Left, Dorsal Foot) 1. Cleanse With Wound Cleanser Soap and water 3. Primary Dressing Applied Calcium Alginate Ag 4. Secondary Dressing ABD Pad Dry Gauze 6. Support Layer Applied 3 layer compression wrap Electronic Signature(s) Signed: 10/17/2019 5:02:18 PM By: Carlene Coria RN Entered By: Carlene Coria on 10/12/2019 11:28:46 -------------------------------------------------------------------------------- Wound Assessment Details Patient Name: Date of Service: [name], [name] 10/12/2019 10:30 A M Medical Record Number: [id number] Patient Account Number: [id number] Date of Birth/Sex: Treating RN: [birth year] (69 y.o. [name] Primary Care Elbert Spickler: [name] Other Clinician: Referring Jentri Aye: Treating Quanah Majka/Extender: Doyle Askew, FRED Weeks in Treatment: 114 Wound Status Wound Number: 65 Primary Venous Leg Ulcer Etiology: Wound Location: Right, Medial Lower Leg Wound Open Wounding Event: Gradually Appeared Status: Date Acquired: 09/28/2019 Comorbid Asthma, Hypertension, Peripheral Arterial Disease, Peripheral Weeks Of Treatment: 2 History: Venous Disease, Type II Diabetes, Gout, Osteoarthritis Clustered Wound: No Photos Photo Uploaded By: Mikeal Hawthorne on 10/13/2019 10:45:24 Wound Measurements Length: (cm) 2.5 Width: (cm) 2.5 Depth: (cm) 0.1 Area: (cm) 4.909 Volume: (cm) 0.491 % Reduction in Area: -346.3% % Reduction in Volume: -346.4% Epithelialization: Small (1-33%) Tunneling:  No Undermining: No Wound Description Classification: Full Thickness Without Exposed Support Structures Wound Margin: Distinct, outline attached Exudate Amount: Medium Exudate Type: Serosanguineous Exudate Color: red, brown Foul Odor After Cleansing: No Slough/Fibrino Yes Wound Bed Granulation Amount: Small (1-33%) Exposed Structure Granulation Quality: Red, Pink Fascia Exposed: No Necrotic Amount: Large (67-100%) Fat Layer (Subcutaneous Tissue) Exposed: Yes Necrotic Quality: Adherent Slough Tendon Exposed: No Muscle Exposed: No Joint Exposed: No Bone Exposed: No Treatment Notes Wound #65 (Right, Medial Lower Leg) 1. Cleanse With Wound Cleanser Soap and water 3. Primary Dressing Applied Calcium Alginate Ag 4. Secondary Dressing ABD Pad Dry Gauze 6. Support Layer Applied 3 layer compression wrap Electronic Signature(s) Signed: 10/17/2019 5:02:18 PM By: Carlene Coria RN Entered By: Carlene Coria on 10/12/2019 11:29:08 -------------------------------------------------------------------------------- Vitals Details Patient Name: Date of Service: [name]. 10/12/2019 10:30 A M Medical Record Number: [id number] Patient Account Number: [id number] Date of Birth/Sex: Treating RN: May 11, 1951 (69 y.o. [name] Primary Care Tyke Outman: [name] Other Clinician: Referring Amarea Macdowell: Treating Chloe Baig/Extender: Doyle Askew, FRED Weeks in Treatment: 114 Vital Signs Time Taken: 11:26 Temperature (F): 97.7 Height (in): 62 Pulse (bpm): 89 Weight (lbs): 335 Respiratory Rate (breaths/min): 18 Body Mass Index (BMI): 61.3 Blood Pressure (mmHg): 160/78 Reference Range: 80 - 120 mg / dl Electronic Signature(s) Signed: 10/17/2019 5:02:18 PM By: Carlene Coria RN Entered By: Carlene Coria on 10/12/2019 11:26:25

## 2019-10-19 ENCOUNTER — Encounter (HOSPITAL_BASED_OUTPATIENT_CLINIC_OR_DEPARTMENT_OTHER): Payer: Medicare PPO | Attending: Physician Assistant | Admitting: Physician Assistant

## 2019-10-19 DIAGNOSIS — M1990 Unspecified osteoarthritis, unspecified site: Secondary | ICD-10-CM | POA: Diagnosis not present

## 2019-10-19 DIAGNOSIS — E11621 Type 2 diabetes mellitus with foot ulcer: Secondary | ICD-10-CM | POA: Insufficient documentation

## 2019-10-19 DIAGNOSIS — Z6841 Body Mass Index (BMI) 40.0 and over, adult: Secondary | ICD-10-CM | POA: Diagnosis not present

## 2019-10-19 DIAGNOSIS — L97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 leg with fat layer exposed: Secondary | ICD-10-CM | POA: Insufficient documentation

## 2019-10-19 DIAGNOSIS — I87331 Chronic venous hypertension (idiopathic) with ulcer and inflammation of right lower extremity: Secondary | ICD-10-CM | POA: Insufficient documentation

## 2019-10-19 DIAGNOSIS — F418 Other specified anxiety disorders: Secondary | ICD-10-CM | POA: Insufficient documentation

## 2019-10-19 DIAGNOSIS — I872 Venous insufficiency (chronic) (peripheral): Secondary | ICD-10-CM | POA: Insufficient documentation

## 2019-10-19 DIAGNOSIS — R531 Weakness: Secondary | ICD-10-CM | POA: Insufficient documentation

## 2019-10-19 DIAGNOSIS — Z8673 Personal history of transient ischemic attack (TIA), and cerebral infarction without residual deficits: Secondary | ICD-10-CM | POA: Insufficient documentation

## 2019-10-19 DIAGNOSIS — E1140 Type 2 diabetes mellitus with diabetic neuropathy, unspecified: Secondary | ICD-10-CM | POA: Diagnosis not present

## 2019-10-19 DIAGNOSIS — I87311 Chronic venous hypertension (idiopathic) with ulcer of right lower extremity: Secondary | ICD-10-CM | POA: Diagnosis not present

## 2019-10-19 DIAGNOSIS — M109 Gout, unspecified: Secondary | ICD-10-CM | POA: Insufficient documentation

## 2019-10-19 DIAGNOSIS — L97522 Non-pressure chronic ulcer of other part of left foot with fat layer exposed: Secondary | ICD-10-CM | POA: Insufficient documentation

## 2019-10-19 DIAGNOSIS — L97822 Non-pressure chronic ulcer of other part of left lower leg with fat layer exposed: Secondary | ICD-10-CM | POA: Insufficient documentation

## 2019-10-19 DIAGNOSIS — J45909 Unspecified asthma, uncomplicated: Secondary | ICD-10-CM | POA: Insufficient documentation

## 2019-10-19 DIAGNOSIS — E1151 Type 2 diabetes mellitus with diabetic peripheral angiopathy without gangrene: Secondary | ICD-10-CM | POA: Insufficient documentation

## 2019-10-19 DIAGNOSIS — I890 Lymphedema, not elsewhere classified: Secondary | ICD-10-CM | POA: Diagnosis not present

## 2019-10-19 DIAGNOSIS — I87332 Chronic venous hypertension (idiopathic) with ulcer and inflammation of left lower extremity: Secondary | ICD-10-CM | POA: Diagnosis not present

## 2019-10-19 DIAGNOSIS — I10 Essential (primary) hypertension: Secondary | ICD-10-CM | POA: Insufficient documentation

## 2019-10-19 NOTE — Progress Notes (Signed)
ATHENIA, RYS ([id number]) Visit Report for 10/19/2019 Arrival Information Details Patient Name: Date of Service: [name], [name] 10/19/2019 10:00 A M Medical Record Number: [id number] Patient Account Number: [id number] Date of Birth/Sex: Treating RN: 01-09-1951 (69 y.o. Martyn Malay, Linda Primary Care Jamilyn Pigeon: [name] Other Clinician: Referring Zakhi Dupre: Treating Pasqualino Witherspoon/Extender: Doyle Askew, FRED Weeks in Treatment: 115 Visit Information History Since Last Visit Added or deleted any medications: No Patient Arrived: Wheel Chair Any new allergies or adverse reactions: No Arrival Time: 10:05 Had a fall or experienced change in No Accompanied By: self activities of daily living that may affect Transfer Assistance: None risk of falls: Patient Identification Verified: Yes Signs or symptoms of abuse/neglect since last visito No Secondary Verification Process Completed: Yes Hospitalized since last visit: No Patient Requires Transmission-Based Precautions: No Implantable device outside of the clinic excluding No Patient Has Alerts: Yes cellular tissue based products placed in the center Patient Alerts: R ABI= 1.01 since last visit: L ABI = .99 Has Dressing in Place as Prescribed: Yes Has Compression in Place as Prescribed: Yes Pain Present Now: Yes Electronic Signature(s) Signed: 10/19/2019 4:36:41 PM By: Deon Pilling Entered By: Deon Pilling on 10/19/2019 10:11:37 -------------------------------------------------------------------------------- Compression Therapy Details Patient Name: Date of Service: [name]. 10/19/2019 10:00 A M Medical Record Number: [id number] Patient Account Number: [id number] Date of Birth/Sex: Treating RN: [birth year] (69 y.o. [name] Primary Care Herald Vallin: [name] Other Clinician: Referring Serra Younan: Treating Persephanie Laatsch/Extender: Doyle Askew, FRED Weeks in Treatment: 115 Compression Therapy Performed for  Wound Assessment: Wound #65 Right,Medial Lower Leg Performed By: Clinician Carlene Coria, RN Compression Type: Three Layer Post Procedure Diagnosis Same as Pre-procedure Electronic Signature(s) Signed: 10/19/2019 5:03:53 PM By: [name] RN, BSN Entered By: [name] on 10/19/2019 11:04:45 -------------------------------------------------------------------------------- Compression Therapy Details Patient Name: Date of Service: [name]. 10/19/2019 10:00 A M Medical Record Number: [id number] Patient Account Number: [id number] Date of Birth/Sex: Treating RN: 07-26-1950 (69 y.o. [name] Primary Care Sultan Pargas: [name] Other Clinician: Referring Avelina Mcclurkin: Treating Faun Mcqueen/Extender: Doyle Askew, FRED Weeks in Treatment: 115 Compression Therapy Performed for Wound Assessment: Wound #61 Left,Circumferential Lower Leg Performed By: Clinician Carlene Coria, RN Compression Type: Three Layer Post Procedure Diagnosis Same as Pre-procedure Electronic Signature(s) Signed: 10/19/2019 5:03:53 PM By: [name] RN, BSN Entered By: [name] on 10/19/2019 11:04:46 -------------------------------------------------------------------------------- Encounter Discharge Information Details Patient Name: Date of Service: [name]. 10/19/2019 10:00 A M Medical Record Number: [id number] Patient Account Number: [id number] Date of Birth/Sex: Treating RN: Jan 14, 1951 (68 y.o. [name] Primary Care Karrisa Didio: [name] Other Clinician: Referring Yoshino Broccoli: Treating Ayyan Sites/Extender: Doyle Askew, FRED Weeks in Treatment: [phone] Encounter Discharge Information Items Discharge Condition: Stable Ambulatory Status: Ambulatory Discharge Destination: Home Transportation: Private Auto Accompanied By: self Schedule Follow-up Appointment: No Clinical Summary of Care: Patient Declined Electronic Signature(s) Signed: 10/19/2019 4:37:47 PM By:  Carlene Coria RN Entered By: Carlene Coria on 10/19/2019 11:25:59 -------------------------------------------------------------------------------- Lower Extremity Assessment Details Patient Name: Date of Service: [name] 10/19/2019 10:00 A M Medical Record Number: [id number] Patient Account Number: [id number] Date of Birth/Sex: Treating RN: 05-23-1951 (69 y.o. [name] Primary Care Bettye Sitton: [name] Other Clinician: Referring Helia Haese: Treating Rishaan Gunner/Extender: Doyle Askew, FRED Weeks in Treatment: 115 Edema Assessment Assessed: [Left: Yes] [Right: Yes] Edema: [Left: Yes] [Right: Yes] Calf Left: Right: Point of Measurement: 37 cm From Medial Instep 38 cm 38 cm Ankle Left: Right: Point of Measurement: 10  cm From Medial Instep 23 cm 29 cm Electronic Signature(s) Signed: 10/19/2019 4:36:41 PM By: Deon Pilling Signed: 10/19/2019 5:03:53 PM By: [name] RN, BSN Entered By: Deon Pilling on 10/19/2019 10:16:18 -------------------------------------------------------------------------------- [place] Details Patient Name: Date of Service: [name]. 10/19/2019 10:00 A M Medical Record Number: [id number] Patient Account Number: [id number] Date of Birth/Sex: Treating RN: [birth year] (69 y.o. [name] Primary Care Illias Pantano: [name] Other Clinician: Referring Jenniah Bhavsar: Treating Damontae Loppnow/Extender: Doyle Askew, FRED Weeks in Treatment: [phone] Active Inactive Venous Leg Ulcer Nursing Diagnoses: Actual venous Insuffiency (use after diagnosis is confirmed) Knowledge deficit related to disease process and management Goals: Patient will maintain optimal edema control Date Initiated: 08/12/2017 Target Resolution Date: 11/16/2019 Goal Status: Active Patient/caregiver will verbalize understanding of disease process and disease management Date Initiated: 08/12/2017 Date Inactivated: 04/21/2018 Target  Resolution Date: 04/24/2018 Goal Status: Met Interventions: Assess peripheral edema status every visit. Compression as ordered Treatment Activities: Therapeutic compression applied : 08/12/2017 Notes: Wound/Skin Impairment Nursing Diagnoses: Impaired tissue integrity Knowledge deficit related to ulceration/compromised skin integrity Goals: Patient/caregiver will verbalize understanding of skin care regimen Date Initiated: 08/12/2017 Target Resolution Date: 11/16/2019 Goal Status: Active Ulcer/skin breakdown will have a volume reduction of 30% by week 4 Date Initiated: 08/05/2017 Date Inactivated: 09/30/2017 Target Resolution Date: 10/03/2017 Goal Status: Met Ulcer/skin breakdown will have a volume reduction of 50% by week 8 Date Initiated: 09/30/2017 Date Inactivated: 10/28/2017 Target Resolution Date: 10/28/2017 Goal Status: Met Interventions: Assess patient/caregiver ability to perform ulcer/skin care regimen upon admission and as needed Assess ulceration(s) every visit Provide education on ulcer and skin care Screen for HBO Treatment Activities: Patient referred to home care : 08/05/2017 Skin care regimen initiated : 08/05/2017 Topical wound management initiated : 08/05/2017 Notes: Electronic Signature(s) Signed: 10/19/2019 5:03:53 PM By: [name] RN, BSN Entered By: [name] on 10/19/2019 11:03:14 -------------------------------------------------------------------------------- Pain Assessment Details Patient Name: Date of Service: [name]. 10/19/2019 10:00 A M Medical Record Number: [id number] Patient Account Number: [id number] Date of Birth/Sex: Treating RN: 1950/08/29 (69 y.o. [name] Primary Care Ethelyn Cerniglia: [name] Other Clinician: Referring Kaitlin Ardito: Treating Jenie Parish/Extender: Doyle Askew, FRED Weeks in Treatment: [phone] Active Problems Location of Pain Severity and Description of Pain Patient Has Paino Yes Site  Locations Pain Location: Pain in Ulcers Rate the pain. Current Pain Level: 7 Worst Pain Level: 10 Least Pain Level: 0 Tolerable Pain Level: 8 Character of Pain Describe the Pain: Aching, Sharp Pain Management and Medication Current Pain Management: Medication: Yes Cold Application: No Rest: Yes Massage: No Activity: No T.E.N.S.: No Heat Application: No Leg drop or elevation: No Is the Current Pain Management Adequate: Adequate How does your wound impact your activities of daily livingo Sleep: No Bathing: No Appetite: No Relationship With Others: No Bladder Continence: No Emotions: No Bowel Continence: No Work: No Toileting: No Drive: No Dressing: No Hobbies: No Electronic Signature(s) Signed: 10/19/2019 4:36:41 PM By: Deon Pilling Signed: 10/19/2019 5:03:53 PM By: [name] RN, BSN Entered By: Deon Pilling on 10/19/2019 10:15:00 -------------------------------------------------------------------------------- Patient/Caregiver Education Details Patient Name: Date of Service: Colbaugh, Lilou M. 5/5/2021andnbsp10:00 A M Medical Record Number: [id number] Patient Account Number: [id number] Date of Birth/Gender: Treating RN: 28-Mar-1951 (69 y.o. [name] Primary Care Physician: [name] Other Clinician: Referring Physician: Treating Physician/Extender: Doyle Askew, FRED Weeks in Treatment: [phone] Education Assessment Education Provided To: Patient Education Topics Provided Venous: Methods: Explain/Verbal Responses: Reinforcements needed, State content correctly Wound/Skin Impairment: Methods: Explain/Verbal  Responses: Reinforcements needed, State content correctly Electronic Signature(s) Signed: 10/19/2019 5:03:53 PM By: [name] RN, BSN Entered By: [name] on 10/19/2019 11:03:46 -------------------------------------------------------------------------------- Wound Assessment Details Patient Name: Date of  Service: [name]. 10/19/2019 10:00 A M Medical Record Number: [id number] Patient Account Number: [id number] Date of Birth/Sex: Treating RN: 1950-10-27 (69 y.o. [name] Primary Care Monnie Gudgel: [name] Other Clinician: Referring Nyzir Dubois: Treating Jaselle Pryer/Extender: Doyle Askew, FRED Weeks in Treatment: 115 Wound Status Wound Number: 61 Primary Venous Leg Ulcer Etiology: Wound Location: Left, Circumferential Lower Leg Wound Open Wounding Event: Gradually Appeared Status: Date Acquired: 04/20/2019 Comorbid Asthma, Hypertension, Peripheral Arterial Disease, Peripheral Weeks Of Treatment: 26 History: Venous Disease, Type II Diabetes, Gout, Osteoarthritis Clustered Wound: No Wound Measurements Length: (cm) 10 Width: (cm) 22.5 Depth: (cm) 0.3 Area: (cm) 176.715 Volume: (cm) 53.014 % Reduction in Area: -2243.7% % Reduction in Volume: -6931% Epithelialization: Small (1-33%) Tunneling: No Undermining: No Wound Description Classification: Full Thickness Without Exposed Support Structures Wound Margin: Flat and Intact Exudate Amount: Large Exudate Type: Serous Exudate Color: amber Foul Odor After Cleansing: No Slough/Fibrino Yes Wound Bed Granulation Amount: Medium (34-66%) Exposed Structure Granulation Quality: Pink, Pale Fascia Exposed: No Necrotic Amount: Medium (34-66%) Fat Layer (Subcutaneous Tissue) Exposed: Yes Necrotic Quality: Adherent Slough Tendon Exposed: No Muscle Exposed: No Joint Exposed: No Bone Exposed: No Treatment Notes Wound #61 (Left, Circumferential Lower Leg) 1. Cleanse With Wound Cleanser Soap and water 2. Periwound Care Barrier cream 3. Primary Dressing Applied Calcium Alginate Ag 4. Secondary Dressing Dry Gauze 6. Support Layer Applied 3 layer compression wrap Electronic Signature(s) Signed: 10/19/2019 4:36:41 PM By: Deon Pilling Signed: 10/19/2019 5:03:53 PM By: [name] RN, BSN Entered By: Deon Pilling on 10/19/2019 10:17:03 -------------------------------------------------------------------------------- Wound Assessment Details Patient Name: Date of Service: [name]. 10/19/2019 10:00 A M Medical Record Number: [id number] Patient Account Number: [id number] Date of Birth/Sex: Treating RN: 1951/02/21 (69 y.o. [name] Primary Care Karie Skowron: [name] Other Clinician: Referring Myelle Poteat: Treating Kwesi Sangha/Extender: Doyle Askew, FRED Weeks in Treatment: 115 Wound Status Wound Number: 64 Primary Diabetic Wound/Ulcer of the Lower Extremity Etiology: Wound Location: Left, Dorsal Foot Wound Open Wounding Event: Gradually Appeared Status: Date Acquired: 06/01/2019 Comorbid Asthma, Hypertension, Peripheral Arterial Disease, Peripheral Weeks Of Treatment: 20 History: Venous Disease, Type II Diabetes, Gout, Osteoarthritis Clustered Wound: No Wound Measurements Length: (cm) 5.8 Width: (cm) 7 Depth: (cm) 0.1 Area: (cm) 31.887 Volume: (cm) 3.189 % Reduction in Area: -19225.5% % Reduction in Volume: -6408.2% Epithelialization: Small (1-33%) Tunneling: No Undermining: No Wound Description Classification: Grade 1 Wound Margin: Flat and Intact Exudate Amount: Medium Exudate Type: Serosanguineous Exudate Color: red, brown Foul Odor After Cleansing: No Slough/Fibrino Yes Wound Bed Granulation Amount: Medium (34-66%) Exposed Structure Granulation Quality: Pink, Pale Fascia Exposed: No Necrotic Amount: Medium (34-66%) Fat Layer (Subcutaneous Tissue) Exposed: Yes Necrotic Quality: Adherent Slough Tendon Exposed: No Muscle Exposed: No Joint Exposed: No Bone Exposed: No Treatment Notes Wound #64 (Left, Dorsal Foot) 1. Cleanse With Wound Cleanser Soap and water 2. Periwound Care Barrier cream 3. Primary Dressing Applied Calcium Alginate Ag 4. Secondary Dressing Dry Gauze 6. Support Layer Applied 3 layer compression wrap Electronic  Signature(s) Signed: 10/19/2019 4:36:41 PM By: Deon Pilling Signed: 10/19/2019 5:03:53 PM By: [name] RN, BSN Entered By: Deon Pilling on 10/19/2019 10:16:01 -------------------------------------------------------------------------------- Wound Assessment Details Patient Name: Date of Service: [name]. 10/19/2019 10:00 A M Medical Record Number: [id number] Patient Account Number: [id number] Date of Birth/Sex:  Treating RN: 19-Sep-1950 (69 y.o. [name] Primary Care Chemeka Filice: [name] Other Clinician: Referring Ethelbert Thain: Treating Falcon Mccaskey/Extender: Doyle Askew, FRED Weeks in Treatment: 115 Wound Status Wound Number: 65 Primary Venous Leg Ulcer Etiology: Wound Location: Right, Medial Lower Leg Wound Open Wounding Event: Gradually Appeared Status: Date Acquired: 09/28/2019 Comorbid Asthma, Hypertension, Peripheral Arterial Disease, Peripheral Weeks Of Treatment: 3 History: Venous Disease, Type II Diabetes, Gout, Osteoarthritis Clustered Wound: No Wound Measurements Length: (cm) 1 Width: (cm) 2 Depth: (cm) 0.1 Area: (cm) 1.571 Volume: (cm) 0.157 % Reduction in Area: -42.8% % Reduction in Volume: -42.7% Epithelialization: Medium (34-66%) Tunneling: No Undermining: No Wound Description Classification: Full Thickness Without Exposed Support Structures Wound Margin: Distinct, outline attached Exudate Amount: Medium Exudate Type: Serosanguineous Exudate Color: red, brown Foul Odor After Cleansing: No Slough/Fibrino Yes Wound Bed Granulation Amount: Large (67-100%) Exposed Structure Granulation Quality: Red, Pink Fascia Exposed: No Necrotic Amount: Small (1-33%) Fat Layer (Subcutaneous Tissue) Exposed: Yes Necrotic Quality: Adherent Slough Tendon Exposed: No Muscle Exposed: No Joint Exposed: No Bone Exposed: No Treatment Notes Wound #65 (Right, Medial Lower Leg) 1. Cleanse With Wound Cleanser Soap and water 2. Periwound  Care Barrier cream 3. Primary Dressing Applied Calcium Alginate Ag 4. Secondary Dressing Dry Gauze 6. Support Layer Applied 3 layer compression wrap Electronic Signature(s) Signed: 10/19/2019 4:36:41 PM By: Deon Pilling Signed: 10/19/2019 5:03:53 PM By: [name] RN, BSN Entered By: Deon Pilling on 10/19/2019 10:17:40 -------------------------------------------------------------------------------- Vitals Details Patient Name: Date of Service: [name]. 10/19/2019 10:00 A M Medical Record Number: [id number] Patient Account Number: [id number] Date of Birth/Sex: Treating RN: 01/10/1951 (69 y.o. [name] Primary Care Leston Schueller: [name] Other Clinician: Referring Quanah Majka: Treating Enza Shone/Extender: Doyle Askew, FRED Weeks in Treatment: 115 Vital Signs Time Taken: 10:10 Temperature (F): 98.2 Height (in): 62 Pulse (bpm): 88 Weight (lbs): 335 Respiratory Rate (breaths/min): 18 Body Mass Index (BMI): 61.3 Blood Pressure (mmHg): 131/72 Reference Range: 80 - 120 mg / dl Electronic Signature(s) Signed: 10/19/2019 4:36:41 PM By: Deon Pilling Entered By: Deon Pilling on 10/19/2019 10:11:54

## 2019-10-19 NOTE — Progress Notes (Addendum)
[name], [name] ([id number]) Visit Report for 10/19/2019 Chief Complaint Document Details Patient Name: Date of Service: [name], [name] 10/19/2019 10:00 A M Medical Record Number: [id number] Patient Account Number: [id number] Date of Birth/Sex: Treating RN: [birth year] (69 y.o. [name] Primary Care Provider: [name] Other Clinician: Referring Provider: Treating Provider/Extender: Doyle Askew, FRED Weeks in Treatment: [phone] Information Obtained from: Patient Chief Complaint Bilateral reoccurring LE ulcers Electronic Signature(s) Signed: 10/19/2019 10:38:41 AM By: [name] PA-C Entered By: [name] on 10/19/2019 10:38:41 -------------------------------------------------------------------------------- HPI Details Patient Name: Date of Service: [name]. 10/19/2019 10:00 A M Medical Record Number: [id number] Patient Account Number: [id number] Date of Birth/Sex: Treating RN: 11/24/1950 (69 y.o. [name] Primary Care Provider: [name] Other Clinician: Referring Provider: Treating Provider/Extender: Doyle Askew, FRED Weeks in Treatment: 115 History of Present Illness HPI Description: this patient has been seen a couple of times before and returns with recurrent problems to her right and left lower extremity with swelling and weeping ulcerations due to not wearing her compression stockings which she had been advised to do during her last discharge, at the end of June 2018. During her last visit the patient had had normal arterial blood flow and her venous reflux study did not necessitate any surgical intervention. She was recommended compression and elevation and wound care. After prolonged treatment the patient was completely healed but she has been noncompliant with wearing or compressions.. She was here last week with an outpatient return visit planned but the patient came in a very poor general condition with altered  mental status and was rushed to the ER on my request. With a history of hypertension, diabetes, TIA and right-sided weakness she was set up for an MRI on her brain and cervical spine and was sent to [facility]. Getting an MRI done was very difficult but once the workup was done she was found not to have any spinal stenosis, epidural abscess or hematoma or discitis. This was radiculopathy to be treated as an outpatient and she was given a follow-up appointment. Today she is feeling much better alert and oriented and has come to reevaluate her bilateral lower extremity lymphedema and ulceration 03/25/2017 -- she was admitted to the hospital on 03/16/2017 and discharged on 03/18/2017 with left leg cellulitis and ulceration. She was started on vancomycin and Zosyn and x-ray showed no bony involvement. She was treated for a cellulitis with IV antibiotics changed to Rocephin and Flagyl and was discharged on oral Keflex and doxycycline to complete a 7 day course. Last hemoglobin A1c was 7.1 and her other ailments including hypertension got asthma were appropriately treated. 05/06/2017 -- she is awaiting the right size of compression stockings from [place] but other than that has been doing well. ====== Old notes 69 year old patient was seen one time last October and was lost to follow-up. She has recurrent problems with weeping and ulceration of her left lower extremity and has swelling of this for several years. It has been worse for the last 2 months. Past medical history is significant for diabetes mellitus type 2, hypertension, gout, morbid obesity, depressive disorders, hiatal hernia, migraines, status post knee surgery, risk of a cholecystectomy, vaginal hysterectomy and breast biopsy. She is not a smoker. As noted before she has never had a venous duplex study and an arterial ABI study was attempted but the left lower extremity was noncompressible 10/01/2016 -- had a lower extremity venous duplex  reflux evaluation which showed  no evidence of deep vein reflux in the right or left lower extremity, and no evidence of great saphenous vein reflux more than 500 ms in the right or left lower extremity, and the left small saphenous vein is incompetent but no vascular consult was recommended. review of her electronic medical records noted that the ABI was checked in July 2017 where the right ABI was normal limits and the left ABI could not be ascertained due to pain with cuff pressure but the waveforms are within normal limits. her arterial duplex study scheduled for April 27. 10/08/2016 -- the patient has various reasons for not having a compression on and for the last 3 days she has had no compression on her left lower extremity either due to pain or the lack of nursing help. She does not use her juxta lites either. 10/15/2016 -- the patient did not keep her appointment for arterial duplex study on April 27 and I have asked her to reschedule this. Her pain is out of proportion with the physical findings and she continuously fails to wear a compression wraps and cuts them off because she says she cannot tolerate the pain. She does not use her juxta lites either. 10/22/2016 -- he has rescheduled her arterial duplex study to May 21 and her pain today is a bit better. She has not been wearing her juxta lites on her right lower extremity but now understands that she needs to do this. She did tolerate the to press compression wrap on her left lower extremity 10/29/2016 --arterial duplex study is scheduled for next week and overall she has been tolerating her compression wraps and also using her juxta lites on her right lower extremity 11/05/2016 -- the right ABI was 0.95 the left was 1.03. The digit TBI is on the right was 0.83 on the left was 0.92 and she had biphasic flow through these vessels. The impression was that of normal lower extremity arterial study. 11/12/2016 -- her pain is minimal and she  is doing very well overall. 11/26/2016 -- she has got juxta lites and her insurance will not pay for additional dual layer compression stockings. She is going to order some from [place]. 05/12/2017 -- her juxta lites are very old and too big for her and these have not been helping with compression. She did get 20-30 mm compression stockings from [place] but she and her husband are unable to put these on. I believe she will benefit from bilateral Extremit-ease, compression stockings and we will measure her for these today. 05/20/2017 -- lymphedema on the left lower extremity has increased a lot and she has a open ulceration as a result of this. The right lower extremity is looking pretty good. She has decided to by the compression stockings herself and will get reimbursed by the home health, at a later date. 05/27/2017 -- her sciatica is bothering her a lot and she thought her left leg pain was caused due to the compression wrap and hence removed it and has significant lymphedema. There is no inflammation on this left lower extremity. 06/17/17 on evaluation today patient appears to be doing very well and in fact is completely healed in regard to her ulcerations. Unfortunately however she does have continued issues with lymphedema nonetheless. We did order compression garments for her unfortunately she states that the size that she received were large although we ordered medium. Obviously this means she is not getting the optimal compression. She does not have those with her today and therefore  we could not confirm and contact the company on her behalf. Nonetheless she does state that she is going to have her husband bring them by tomorrow so that we can verify and then get in touch with the company. No fevers, chills, nausea, or vomiting noted at this time. Overall patient is doing better otherwise and I'm pleased with the progress she has made. 07/01/17 on evaluation today patient appears to be doing  very well in regard to her bilateral lower extremity she does not have any openings at this point which is excellent news. Overall I'm pleased with how things have progressed up to this time. Since she is doing so well we did order her compression which we are seeing her today to ensure that it fits her properly and everything is doing well in that regard and then subsequently she will be discharged. ============ Old Notes: 03/31/16 patient presents today for evaluation concerning open wounds that she has over the left medial ankle region as well as the left dorsal foot. She has previously had this occur although it has been healed for a number of months after having this for about a year prior until her hospitalization on 01/05/16. At that point in time it appears that she was admitted to the hospital for left lower extremity cellulitis and was placed on vancomycin and Zosyn at that point. Eventually upon discharge on January 15, 2016 she was placed on doxycycline at that point in time. Later on 03/27/16 positive wound culture growing Escherichia coli this was switched to amoxicillin. Currently she tells me that she is having pain radiated to be a 7 out of 10 which can be as high as 10 out of 10 with palpation and manipulation of the wound. This wound appears to be mainly venous in nature due to the bilateral lower extremity venous stasis/lymphedema. This is definitely much worse on her left than the right side. She does have type 1 diabetes mellitus, hypertension, morbid obesity, and is wheelchair dependent.during the course of the hospital stay a blood culture was also obtained and fortunately appeared negative. She also had an x-ray of the tibia/fibula on the left which showed no acute bone abnormality. Her white blood cell count which was performed last on 03/25/16 was 7.3, hemoglobin 12.8, protein 7.1, albumin 3.0. Her urine culture appeared to be negative for any specific organisms. Patient did  have a left lower extremity venous duplex evaluation for DVT . This did not include venous reflux studies but fortunately was negative for DVT Patient also had arterial studies performed which revealed that she had a . normal ABI on the right though this was unable to be performed on the left secondary to pain that she was having around the ankle region due to the wound. However it was stated on report that she had biphasic pulses and apparently good blood flow. ========== 06/03/17 she is here in follow-up evaluation for right lower extremity ulcer. The right lower sure he has healed but she has reopened to the left medial malleolus and dorsal foot with weeping. She is waiting for new compression garments to arrive from home health, the previous compression garments were ill fitting. We will continue with compression bilaterally and follow-up in 2 weeks Readmission: 08/05/17 on evaluation today patient appears to be doing somewhat poorly in regard to her left lower extremity especially although the right lower extremity has a small area which may no longer be open. She has been having a lot of drainage from the  left lower extremity however he tells me that she has not been able to use the EXTREMIT-EASE Compression at this point. She states that she did better and was able to actually apply the Juxta-Lite compression although the wound that she has is too large and therefore really does not compress which is why she cannot wear it at this point. She has no one who can help her put it on regular basis her son can sometimes but he's not able to do it most of the time. I do believe that's why she has begun to weave and have issues as she is currently yet again. No fevers, chills, nausea, or vomiting noted at this time. Patient is no evidence of dementia. 08/12/17 on evaluation today patient appears to still be doing fairly well in regard to the draining areas/weeping areas at this point. With that being  said she unfortunately did go to the ER yesterday due to what was felt to be possibly a cellulitis. They place her on doxycycline by mouth and discharge her home. She definitely was not admitted. With that being said she states she has had more discomfort which has been unusual for her even compared to prior times and she's had infections.08/12/17 on evaluation today patient appears to still be doing fairly well in regard to the draining areas/weeping areas at this point. With that being said she unfortunately did go to the ER yesterday due to what was felt to be possibly a cellulitis. They place her on doxycycline by mouth and discharge her home. She definitely was not admitted. With that being said she states she has had more discomfort which has been unusual for her even compared to prior times and she's had infections. 08/19/17 put evaluation today patient tells me that she's been having a lot of what sounds to be neuropathic type pain in regard to her left lower extremity. She has been using over-the-counter topical bins again which some believe. That in order to apply the she actually remove the wrap we put on her last Wednesday on Thursday. Subsequently she has not had anything on compression wise since that time. The good news is a lot of the weeping areas appear to have closed at this point again I believe she would do better with compression but we are struggling to get her to actually use what she needs to at this point. No fevers, chills, nausea, or vomiting noted at this time. 09/03/17 on evaluation today patient appears to be doing okay in regard to her lower extremities in regard to the lymphedema and weeping. Fortunately she does not seem to show any signs of infection at this point she does have a little bit of weeping occurring in the right medial malleolus area. With that being said this does not appear to be too significant which is good news. 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 She has severe skin damage secondary to both of these features involving the dorsal left foot and medial left ankle and lower leg. Still has open areas in the left anterior foot. The area on the right closed over. She uses her own juxta light stockings. She does not have an arterial issue 09/16/17 on evaluation today patient actually appears to be doing excellent in regard to her bilateral lower extremity swelling. The Juxta-Lite compression wrap seem to be doing very well for her. She has not however been using the portion that goes over her foot.  Her left foot still is draining a little bit not nearly as significant as it has been in the past but still I do believe that she likely needs to utilize the full wrap including the foot portion of this will improve as well. She also has been apparently putting on a significant amount of Vaseline which also think is not helpful for her. I recommended that if she feels she needs something for moisturizer Eucerin will probably be better. 09/30/17 on evaluation today patient presents with several new open areas in regard to her left lower extremity although these appear to be minimal and mainly seem to be more moisture breakdown than anything. Fortunately she does not seem to have any evidence of infection which is great news. She has been tolerating the dressing changes without complication we are using silver alginate on the foot she has been using AB pads to have the legs and using her Juxta- Lite compression which seems to be controlling her swelling very well. Overall I'm pleased with the poor way she has progressed. 10/14/17 on evaluation today patient appears to be doing better in regard to her left lower extremity areas of weeping. She does still have some discomfort although in general this does not appear to be as macerated and I think it is progressing nicely. I do think she still  needs to wear the foot portion of her Juxta- Lite in order to get the most benefit from the wrap obviously. She states she understands. Fortunately there does not appear to be evidence of infection at this time which is great news. 10/28/17 on evaluation today patient appears to be doing excellent in regard to her left lower extremity. She has just a couple areas that are still open and seem to be causing any trouble whatsoever. For that reason I think that she is definitely headed in the right direction the spots are very tiny compared to what we have been dealing with in the past. 11/11/17 on evaluation today patient appears to have a right lateral lower extremity ulcer that has opened since I last saw her. She states this is where the home health nurse that was coming out remove the dressing without wetting the alginate first. Nonetheless I do not know if this is indeed the case or not but more importantly we have not ordered home help to be coming out for her wounds at all. I'm unsure as to why they are coming out and we're gonna have to check on this and get things situated in that regard. With that being said we currently really do not need them to be coming out as the patient has been taking care of her leg herself without complication and no issues. In fact she was doing much better prior to nursing coming out. 11/25/17 on evaluation today patient actually appears to be doing fairly well in regard to her left lower extremity swelling. In fact she has very little area of weeping at this point there's just a small spot on the lateral portion of her right leg that still has me just a little bit more concerned as far as wanting to see this clear up before I discharge her to caring for this at home. Nonetheless overall she has made excellent progress. 12/09/17 on evaluation today patient appears to be doing rather well in regard to her lower extremity edema. She does have some weeping still in the left  lower extremity although the big area we were taking care of two  weeks ago actually has closed and she has another area of weeping on the left lower extremity immediately as well is the top of her foot. She does not currently have lymphedema pumps she has been wearing her compression daily on a regular basis as directed. With that being said I think she may benefit from lymphedema pumps. 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 We had a very difficult time getting and keeping this under control. 12/23/17 on evaluation today patient actually appears to be doing a little bit more poorly in regard to her bilateral lower extremities. She has been tolerating the Juxta-Lite compression wraps. Unfortunately she has two new ulcers on the right lower extremity and left lower Trinity ulceration seems to be larger. Obviously this is not good news. She has been tolerating the dressings without complication. 12/30/17 on evaluation today patient actually appears to be doing much better in regard to her bilateral lower extremity edema. She continues to have some issues with ulcerations and in fact there appears to be one spot on each leg where the wrap may have caused a little bit of a blister which is subsequently opened up at this point is given her pain. Fortunately it does not appear to be any evidence of infection which is good news. No fevers chills noted. 01/13/18 on evaluation today patient appears to be doing rather well in regard to her bilateral lower extremities. The dressings did get kind of stuck as far as the wound beds are concerned but again I think this is mainly due to the fact that she actually seems to be showing signs of healing which is good news. She's not having as much drainage therefore she was having more of the dressing sticking.  Nonetheless overall I feel like her swelling is dramatically down compared to previous. 01/20/18 on evaluation today patient unfortunately though she's doing better in most regards has a large blister on the left anterior lower extremity where she is draining quite significantly. Subsequently this is going to need debridement today in order to see what's underneath and ensure she does not continue to trapping fluid at this location. Nonetheless No fevers, chills, nausea, or vomiting noted at this time. 01/27/18 on evaluation today patient appears to be doing rather well at this point in regard to her right lower extremity there's just a very small area that she still has open at this point. With that being said I do believe that she is tolerating the compression wraps very well in making good progress. Home health is coming out at this point to see her. Her left lower extremity on the lateral portion is actually what still mainly open and causing her some discomfort for the most part 02/10/18 on evaluation today patient actually appears to be doing very well in regard to her right lower extremity were all the ulcers appear to be completely close. In regard to the left lower extremity she does have two areas still open and some leaking from the dorsal surface of her foot but this still seems to be doing much better to me in general. 02/24/18 on evaluation today patient actually appears to be doing much better in regard to her right lower extremity this is still completely healed. Her left lower extremity is also doing much better fortunately she has no evidence of infection. The one  area that is gonna require some debridement is still on the left anterior shin. Fortunately this is not hurting her as badly today. 03/10/18 on evaluation today patient appears to be doing better in some regards although she has a little bit more open area on the dorsal foot and she also has some issues on the medial portion of  the left lower extremity which is actually new and somewhat deep. With that being said there fortunately does not appear to be any significant signs of infection which is good news. No fevers, chills, nausea, or vomiting noted at this time. In general her swelling seems to be doing fairly well which is good news. 03/31/18 on evaluation today patient presents for follow-up concerning her left lower extremity lymphedema. Unfortunately she has been doing a little bit more poorly since I last saw her in regard to the amount of weeping that she is experiencing. She's also having some increased pain in the anterior shin location. Unfortunately I do not feel like the patient is making such good progress at this point a few weeks back she was definitely doing much better. 04/07/18 on evaluation today patient actually appears to be showing some signs of improvement as far as the left lower extremity is concerned. She has been tolerating the dressing changes and it does appear that the Drawtex did better for her. With that being said unfortunately home health is stating that they cannot obtain the Drawtex going forward. Nonetheless we're gonna have to check and see what they may be able to get the alginate they were using was getting stuck in causing new areas of skin being pulled all that with and subsequently weep and calls her to worsen overall this is the first time we've seen improvement at this time. 04/14/18 on evaluation today patient actually appears to be doing rather well at this point there does not appear to be any evidence of infection at this time and she is actually doing excellent in regard to the weeping in fact she almost has no openings remaining even compared to just last week this is a dramatic improvement. No fevers chills noted 04/21/18 evaluation today patient actually appears to be doing very well. She in fact is has a small area on the posterior lower extremity location and she has  a small area on the dorsal surface of her foot that are still open both of which are very close to closing. We're hoping this will be close shortly. She brought her Juxta-Lite wrap with her today hoping that would be able to put her in it unfortunately I don't think were quite at that point yet but we're getting closer. 04/28/18 upon evaluation today patient actually appears to be doing excellent in regard to her left lower extremity ulcer. In fact the region on the posterior lower extremity actually is much smaller than previously noted. Overall I'm very happy with the progress she has made. She again did bring her Juxta-Lite although we're not quite ready for that yet. 05/11/18 upon evaluation today patient actually appears to be doing in general fairly well in regard to her left lower Trinity. The swelling is very well controlled. With that being said she has a new area on the left anterior lower extremity as well as between the first and second toes of her left foot that was not present during the last evaluation. The region of her posterior left lower extremity actually appears to be almost completely healed. T be honest I'm very pleased  with o the way that stands. Nonetheless I do believe that the lotion may be keeping the area to moist as far as her legs are concerned subsequently I'm gonna consider discontinuing that today. 05/26/18 on evaluation today patient appears to be doing rather well in regard to her left lower should be ulcers. In fact everything appears to be close except for a very small area on the left posterior lower extremity. Fortunately there does not appear to be any evidence of infection at this time. Overall very pleased with her progress. 06/02/18 and evaluation today patient actually appears to be doing very well in regard to her lower extremity ulcers. She has one small area that still continues to weep that I think may benefit her being able to justify lotion and user  Juxta-Lite wraps versus continued to wrap her. Nonetheless I think this is something we can definitely look into at this point. 06/23/18 on evaluation today patient unfortunately has openings of her bilateral lower extremities. In general she seems to be doing much worse than when I last saw her just as far as her overall health standpoint is concerned. She states that her discomfort is mainly due to neuropathy she's not having any other issues otherwise. No fevers, chills, nausea, or vomiting noted at this time. 06/30/18 on evaluation today patient actually appears to be doing a little worse in regard to her right lower extremity her left lower extremity of doing fairly well. Fortunately there is no sign of infection at this time. She has been tolerating the dressing changes without complication. Home health did not come out like they were supposed to for the appropriate wrap changes. They stated that they never received the orders from [place] which were fax. Nonetheless we will send a copy of the orders with the patient today as well. 07/07/18 on evaluation today patient appears to be doing much better in regard to lower extremities. She still has several openings bilaterally although since I last saw her her legs did show obvious signs of infection when she later saw her nurse. Subsequently a culture was obtained and she is been placed on Bactrim and Keflex. Fortunately things seem to be looking much better it does appear she likely had an infection. Again last week we'd even discussed it but again there really was not any obvious sign that she had infection therefore we held off on the antibiotics. Nonetheless I'm glad she's doing better today. 07/14/18 on evaluation today patient appears to be doing much better regarding her bilateral lower Trinity's. In fact on the right lower for me there's nothing open at this point there are some dry skin areas at the sites where she had infection. Fortunately there is  no evidence of systemic infection which is excellent news. No fevers chills noted 07/21/18 on evaluation today patient actually appears to be doing much better in regard to her left lower extremity ulcers. She is making good progress and overall I feel like she's improving each time I see her. She's having no pain I do feel like the infection is completely resolved which is excellent news. No fevers, chills, nausea, or vomiting noted at this time. 07/28/18 on evaluation today patient appears to be doing very well in regard to her left lower [employer]. Everything seems to be showing signs of improvement which is excellent news. Overall very pleased with the progress that has been made. Fortunately there's no evidence of active infection at this time also excellent news. 08/04/18 on evaluation  today patient appears to be doing more poorly in regard to her bilateral lower extremities. She has two new areas open up on the right and these were completely closed as of last week. She still has the two spots on the left which in my pinion seem to be doing better. Fortunately there's no evidence of infection again at this point. 08/11/18 on evaluation today patient actually appears to be doing very well in regard to her bilateral lower Trinity wounds that all seem to be doing better and are measures smaller today. Fortunately there's no signs of infection. No fevers, chills, nausea, or vomiting noted at this time. 08/18/18 on evaluation today patient actually appears to be doing about the same inverter bilateral lower extremities. She continues to have areas that blistering open as was drain that fortunately nothing too significant. Overall I feel like Drawtex may have done better for her however compared to the collagen. 08/25/18 on evaluation today patient appears to be doing a little bit more poorly today even compared to last time I saw her. Again I'm not exactly sure why she's making worse progress over  the past several weeks. I'm beginning to wonder if there is some kind of underlying low level infection causing this issue. I did actually take a culture from the left anterior lower extremity but it was a new wound draining quite a bit at this point. Unfortunately she also seems to be having more pain which is what also makes me worried about the possibility of infection. This is despite never erythema noted at this point. 09/01/18 on evaluation today patient actually appears to be doing a little worse even compared to last week in regard to bilateral lower extremities. She did go to the hospital on the 16th was given a dose of IV Zosyn and then discharged with a recommendation to continue with the Bactrim that I previously prescribed for her. Nonetheless she is still having a lot of discomfort she tells me as well at this time. This is definitely unfortunate. No fevers, chills, nausea, or vomiting noted at this time. 09/08/18 on evaluation today patient's bilateral lower extremities actually appear to be shown signs of improvement which is good news. Fortunately there does not appear to be any signs of active infection I think the anabiotic is helping in this regard. Overall I'm very pleased with how she is progressing. 09/15/18 patient was actually seen in ER yesterday due to her legs as well unfortunately. She states she's been having a lot of pain and discomfort as well as a lot of drainage. Upon inspection today the patient does have a lot of swelling and drainage I feel like this is more related to lymphedema and poor fluid control than it is to infection based on what I'm seeing. The physician in the emergency department also doubted that the patient was having a significant infection nonetheless I see no evidence of infection obvious at this point although I do see evidence of poor fluid control. She still not using a compression pumps, she is not elevating due to her lift chair as well as her  hospital bed being broken, and she really is not keeping her legs up as much as they should be and also has been taking off her wraps. All this combined I think has led to poor fluid control and to be honest she may be somewhat volume overloaded in general as well. I recommend that she may need to contact your physician to see if a  prescription for a diuretic would be beneficial in their opinion. As long as this is safe I think it would likely help her. 09/29/18 on evaluation today patient's left lower extremity actually appears to be doing quite a bit better. At least compared to last time that I saw her. She still has a large area where she is draining from but there's a lot of new skin speckled trout and in fact there's more new skin that there are open areas of weeping and drainage at this point. This is good news. With regard to the right lower extremity this is doing much better with the only open area that I really see being a dry spot on the right lateral ankle currently. Fortunately there's no signs of active infection at this time which is good news. No fevers, chills, nausea, or vomiting noted at this time. The patient seems somewhat stressed and overwhelmed during the visit today she was very lethargic as such. She does and she is not taking any pain medications at this point. Apparently according to her husband are also in the process of moving which is probably taking its toll on her as well. 10/06/18 on evaluation today patient appears to be doing rather well in regard to her lower extremities compared to last evaluation. Fortunately there's no signs of active infection. She tells me she did have an appointment with her primary. Nonetheless he was concerned that the wounds were somewhat deep based on pictures but we never actually saw her legs. She states that he had her somewhat worried due to the fact that she was fearing now that she was [place] have to have an amputation. With that being  said based on what I'm seeing check she looks better this week that she has the last two times I've seen her with much less drainage I'm actually pleased in this regard. That doesn't mean that she's out of the water but again I do not think what the point of talking about education at all in regard to her leg. She is very happy to hear this. She is also not having as much pain as she was having last week. 10/13/18 unfortunately on evaluation today patient still continues to have a significant amount of drainage she's not letting home health actually apply the compression dressings at this point. She's trying to use of Juxta-Lite of the top of Kerlex and the second layer of the three layer compression wrap. With that being said she just does not seem to be making as good a progress as I would expect if she was having the compression applied and in place on a regular basis. No fevers, chills, nausea, or vomiting noted at this time. 10/20/18 on evaluation today patient appears to be doing a little better in regard to her bilateral lower extremity ulcers. In fact the right lower extremity seems to be healed she doesn't even have any openings at this point left lower extremity though still somewhat macerated seems to be showing signs of new skin growth at multiple locations throughout. Fortunately there's no evidence of active infection at this time. No fevers, chills, nausea, or vomiting noted at this time. 10/27/18 on evaluation today patient appears to be doing much better in regard to her left lower Trinity ulcer. She's been tolerating the laptop complication and has minimal drainage noted at this point. Fortunately there's no signs of active infection at this time. No fevers, chills, nausea, or vomiting noted at this time. 11/03/18 on evaluation today  patient actually appears to be doing excellent in regard to her left lower extremity. She is having very little drainage at this point there does not appear  to be any significant signs of infection overall very pleased with how things have gone. She is likewise extremely pleased still and seems to be making wonderful progress week to week. I do believe antibiotics were helpful for her. Her primary care provider did place on amateur clean since I last saw her. 11/17/18 on evaluation today patient appears to be doing worse in regard to her bilateral lower extremities at this point. She is been tolerating the dressing changes without complication. With that being said she typically takes the Coban off fairly quickly upon arriving home even after being seen here in the clinic and does not allow home health reapply command as part of the dressing at home. Therefore she said no compression essentially since I last saw her as best I can tell. With that being said I think it shows and how much swelling she has in the open wounds that are noted at this point. Fortunately there's no signs of infection but unfortunately if she doesn't get this under control I think she will end up with infection and more significant issues. 11/24/18 on evaluation today patient actually appears to be doing somewhat better in regard to her bilateral lower extremities. 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 Nonetheless that is doing better currently so she should be able to attempt reinitiating the lymphedema pumps at this time. No fevers, chills, nausea, or vomiting noted at this time. 12/01/18 upon evaluation today patient's left lower extremity appears to be doing quite well unfortunately her right lower extremity is not doing nearly as well. She has been tolerating the dressing changes without complication unfortunately she did not keep a wrap on the right at this time. Nonetheless I believe this has led to increased swelling and weeping in  the world is actually much larger than during the last evaluation with her. 12/08/18 on evaluation today patient appears to be doing about the same at this point in regard to her right lower extremity. There is some more palatable to touch I'm concerned about the possibility of there being some infection although I think the main issue is she's not keeping her compression wrap on which in turn is not allowing this area to heal appropriately. 12/22/18 on evaluation today patient appears to be doing better in regard to left lower extremity unfortunately significantly worse in regard to the right lower extremity. The areas of blistering and necrotic superficial tissue have spread and again this does not really appear to be signs of infection and all she just doesn't seem to be doing nearly as well is what she has been in the past. Overall I feel like the Augmentin did absolutely nothing for her she doesn't seem to have any infection again I really didn't think so last time either is more of a potential preventative measure and hoping that this would make some difference but I think the main issue is she's not wearing her compression. She tells me she cannot wear the [place] been we put on she takes it off pretty much upon getting home. Subsequently she worshiped Juxta-Lite when I questioned her about how often she wears it this is no  more than three hours a day obviously that leaves 21 hours that she has no compression and this is obviously not doing well for her. Overall I'm concerned that if things continue to worsen she is at great risk of both infection as well as losing her leg. 01/05/19 on evaluation today patient appears to be doing well in regard to her left lower extremity which he is allowing [place] to wrap and not so well with regard to her right lower extremity which she is not allowing [place] to really wrap and keep the wrap on. She states that it hurts too badly whenever it's wrapped and she ends up  having to take it off. She's been using the Juxta-Lite she tells me up to six hours a day although I question whether or not that's really been the case to be honest. Previously she told me three hours today nonetheless obviously the legs as long as the wrap is doing great when she is not is doing much more poorly. 01/12/2019 on evaluation today patient actually appears to be doing a little better in my opinion with regard to her right lower extremity ulcer. She has a small open area on the left lower extremity unfortunately but again this I think is part of the normal fluctuation of what she is going to have to expect with regard to her legs especially when she is not using her lymphedema pumps on a regular basis. Subsequently based on what I am seeing today I think that she does seem to be doing slightly better with regard to her right lower extremity she did see her primary care provider on Monday they felt she had an infection and placed her on 2 antibiotics. Both Cipro and clindamycin. Subsequently again she seems possibly to be doing a little bit better in regards to the right lower extremity she also tells me however she has been wearing the compression wrap over the past week since I spoke with her as well that is a Kerlix and Coban wrap on the right. No fevers, chills, nausea, vomiting, or diarrhea. 01/19/2019 on evaluation today patient appears to be doing better with regard to her bilateral lower extremities especially the right. I feel like the compression has been beneficial for her which is great news. She did get a call from her primary care provider on her way here today telling her that she did have methicillin- resistant Staphylococcus aureus and he was calling in a couple new antibiotics for her including a ointment to be applied she tells me 3 times a day. With that being said this sounds like likely to be Bactroban which I think could be applied with each dressing/wrap change but I  would not be able to accommodate her applying this 3 times a day. She is in agreement with the least doing this we will add that to her orders today. 01/26/2019 on evaluation today patient actually appears to be doing much better with regard to her right lower extremity. Her left lower extremity is also doing quite well all things considering. Fortunately there is no evidence of active infection at this time. No fevers, chills, nausea, vomiting, or diarrhea. 02/02/2019 on evaluation today patient appears to be doing much better compared to her last evaluation. Little by little off like her right leg is returning more towards normal. There does not appear to be any signs of active infection and overall she seems to be doing quite well which is great news. I am very pleased  in this regard. No fevers, chills, nausea, vomiting, or diarrhea. 02/09/2019 upon evaluation today patient appears to be doing better with regard to her bilateral lower extremities. She has been tolerating the dressing changes without complication. Fortunately there is no signs of active infection at this time. No fevers, chills, nausea, vomiting, or diarrhea. 02/23/2019 on evaluation today patient actually appears to be doing quite well with regard to her bilateral lower extremities. She has been tolerating the dressing changes without complication. She is even used her pumps one time and states that she really felt like it felt good. With that being said she seems to be in good spirits and her legs appear to be doing excellent. 03/09/2019 on evaluation today patient appears to be doing well with regard to her right lower extremity there are no open wounds at this time she is having some discomfort but I feel like this is more neuropathy than anything. With regard to her left lower extremity she had several areas scattered around that she does have some weeping and drainage from but again overall she does not appear to be having any  significant issues and no evidence of infection at this time which is good news. 03/23/2019 on evaluation today patient appears to be doing well with regard to her right lower extremity which she tells me is still close she is using her juxta light here. Her left lower extremity she mainly just has an area on the foot which is still slightly draining although this also is doing great. Overall very pleased at this time. 04/06/2019 patient appears to be doing a little bit worse in regard to her left lower extremity upon evaluation today. She feels like this could be becoming infected again which she had issues with previous. Fortunately there is no signs of systemic infection but again this is always a struggle with her with her legs she will go from doing well to not so well in a very short amount of time. 04/20/2019 on evaluation today patient actually appears to be doing quite well with regard to her right lower extremity I do not see any signs of active infection at this time. Fortunately there is no fever chills noted. She is still taking the antibiotics which I prescribed for her at this point. In regard to the left lower extremity I do feel like some of these areas are better although again she still is having weeping from several locations at this time. 04/27/2019 on evaluation today patient appears to be doing about the same if not slightly worse in regard to her left lower extremity ulcers. She tells me when questioned that she has been sleeping in her Hoveround chair in fact she tells me she falls asleep without even knowing it. I think she is spending a whole lot of time in the chair and less time walking and moving around which is not good for her legs either. On top of that she is in a seated position which is also the worst position she is not really elevating her legs and she is also not using her lymphedema pumps. All this is good to contribute to worsening of her condition in  general. 05/18/2019 on evaluation today patient appears to be doing well with regard to her lower extremity on the right in fact this is showing no signs of any open wounds at this time. On the left she is continuing to have issues with areas that do drain. Some of the regions have healed and  there are couple areas that have reopened. She did go to the ER per the patient according to recommendations from the home health nurse due to what she was seen when she came out on 05/13/2019. Subsequently she felt like the patient needed to go to the hospital due to the fact that again she was having "milky white discharge" from her leg. Nonetheless she had and then was placed on doxycycline and subsequently seems to be doing better. 06/01/2019 upon evaluation today patient appears to be doing really in my opinion about the same. I do not see any signs of active infection which is good news. Overall she still has wounds over the bilateral lower extremities she has reopened on the right but this appears to be more of a crack where there is weeping/edema coming from the region. I do not see any evidence of infection at either site based on what I visualized today. 07/13/2019 upon evaluation today patient appears to be doing a little worse compared to last time I saw her. She since has been in the hospital from 06/21/2019 through 06/29/2019. This was secondary to having Covid. During that time they did apply lotion to her legs which unfortunately has caused her to develop a myriad of open wounds on her lower extremities. Her legs do appear to be doing better as far as the overall appearance is concerned but nonetheless she does have more open and weeping areas. 07/27/2019 upon evaluation today patient appears to be doing more poorly to be honest in regard to her left lower extremity in particular. There is no signs of systemic infection although I do believe she may have local infection. She notes she has been having  a lot of blue/green drainage which is consistent potentially with Pseudomonas. That may be something that we need to consider here as well. The doxycycline does not seem to have been helping. 08/03/2019 upon evaluation today patient appears to be doing a little better in my opinion compared to last week's evaluation. Her culture I did review today and she is on appropriate medications to help treat the Enterobacter that was noted. Overall I feel like that is good news. With that being said she is unfortunately continuing to have a lot of drainage and though it is doing better I still think she has a long ways to go to get things dried up in general. Fortunately there is no signs of systemic infection. 08/10/2019 upon evaluation today patient appears to be doing may be slightly better in regard to her left lower extremity the right lower extremity is doing much better. Fortunately there is no signs of infection right now which is good news. No fevers, chills, nausea, vomiting, or diarrhea. 08/24/2019 on evaluation today patient appears to be doing slightly better in regard to her lower extremities. The left lower extremity seems to be healed the right lower extremity is doing better though not completely healed as far as the openings are concerned. She has some generalized issues here with edema and weeping secondary to her lymphedema though again I do believe this is little bit drier compared to prior weeks evaluations. In general I am very pleased with how things seem to be progressing. No fevers, chills, nausea, vomiting, or diarrhea. 08/31/2019 upon evaluation today patient actually seems to making some progress here with regard to the left lower extremity in particular. She has been tolerating the dressing changes without complication. Fortunately there is no signs of active infection at this time. No fevers,  chills, nausea, vomiting, or diarrhea. She did see Dr. Doren Custard and he did note that she did have  a issue with the left great saphenous vein and the small saphenous vein in the leg. With that being said he was concerned about the possibility of laser ablation not being extremely successful. He also mentioned a small risk of DVT associated with the procedure. However if the wounds do not continue to improve he stated that that would probably be the way to go. Fortunately the patient's legs do seem to be doing much better. 09/07/2019 upon evaluation today patient appears to be doing better with regard to her lower extremities. She has been tolerating the dressing changes without complication. With that being said she is showing signs of improvement and overall very pleased. There are some areas on her leg that I think we do need to debride we discussed this last week the patient is in agreement with doing that as long as it does not hurt too badly. 09/14/2019 upon evaluation today patient appears to be doing decently well with regard to her left lower extremity. She is not having near as much weeping as she has had in the past things seem to be drying up which is good news. There is no signs of active infection at this time. 09/21/19 upon evaluation today patient appears to be doing better in regard overall to her bilateral lower extremities. She again has less open than she did previous and each week I feel like this is getting better. Fortunately there is no signs of active infection at this time. No fevers, chills, nausea, vomiting, or diarrhea. 09/28/2019 upon evaluation today patient actually appears to be showing signs of improvement with regard to her left lower extremity. Unfortunately the right medial lower extremity around the ankle region has reopened to some degree but this appears to be minimal still which is good news. There is no signs of active infection at this time which is also good news. 10/12/2019 upon evaluation today patient appears to be doing okay with regard to her bilateral  lower extremities today. The right is a little bit worse then last evaluation 2 weeks ago. The left is actually doing a little better in my opinion. Overall there is no signs of active infection at this time that I see. Obviously that something we have to keep a close eye on she is very prone to this with the significant and multiple openings that she has over the bilateral lower extremities. 10/19/2019 upon evaluation today patient appears to be doing about the best that I have seen her in quite some time. She has been tolerating the dressing changes without complication. There does not appear to be any signs of active infection and overall I am extremely happy with the way her legs appeared. She is drying up quite nicely and overall is having less pain. Electronic Signature(s) Signed: 10/19/2019 6:23:56 PM By: [name] PA-C Entered By: [name] on 10/19/2019 18:23:56 -------------------------------------------------------------------------------- Physical Exam Details Patient Name: Date of Service: SKYLAH, DELAUTER 10/19/2019 10:00 A M Medical Record Number: [id number] Patient Account Number: [id number] Date of Birth/Sex: Treating RN: [birth year] (69 y.o. [name] Primary Care Provider: [name] Other Clinician: Referring Provider: Treating Provider/Extender: Doyle Askew, FRED Weeks in Treatment: 115 Constitutional Obese and well-hydrated in no acute distress. Respiratory normal breathing without difficulty. Psychiatric this patient is able to make decisions and demonstrates good insight into disease process. Alert and Oriented x 3.  pleasant and cooperative. Notes Upon inspection patient's wound bed actually showed signs of good granulation at this time. Fortunately there is no evidence of active infection which is great news. No fevers, chills, nausea, vomiting, or diarrhea. Electronic Signature(s) Signed: 10/19/2019 6:24:21 PM By: [name]  PA-C Entered By: [name] on 10/19/2019 18:24:21 -------------------------------------------------------------------------------- Physician Orders Details Patient Name: Date of Service: [name]. 10/19/2019 10:00 A M Medical Record Number: [id number] Patient Account Number: [id number] Date of Birth/Sex: Treating RN: Nov 03, 1950 (69 y.o. [name] Primary Care Provider: [name] Other Clinician: Referring Provider: Treating Provider/Extender: Doyle Askew, FRED Weeks in Treatment: [phone] Verbal / Phone Orders: No Diagnosis Coding ICD-10 Coding Code Description E11.622 Type 2 diabetes mellitus with other skin ulcer I89.0 Lymphedema, not elsewhere classified I87.331 Chronic venous hypertension (idiopathic) with ulcer and inflammation of right lower extremity I87.332 Chronic venous hypertension (idiopathic) with ulcer and inflammation of left lower extremity L97.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 leg with fat layer exposed L97.822 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 Dressing Change Frequency Wound #61 Left,Circumferential Lower Leg Change dressing three times week. Wound #64 Left,Dorsal Foot Change dressing three times week. Wound #65 Right,Medial Lower Leg Change dressing three times week. Skin Barriers/Peri-Wound Care Barrier cream - zinc oxide cream to any macerated areas Moisturizing lotion Wound Cleansing Clean wound with Wound Cleanser - all wounds May shower with protection. Primary Wound Dressing Wound #61 Left,Circumferential Lower Leg Calcium Alginate with Silver Wound #64 Left,Dorsal  Foot Calcium Alginate with Silver Wound #65 Right,Medial Lower Leg Calcium Alginate with Silver Secondary Dressing Wound #61 Left,Circumferential Lower Leg Dry Gauze - all wounds ABD pad - as needed Wound #64 Left,Dorsal Foot Dry Gauze - all wounds ABD pad - as needed Wound #65 Right,Medial Lower Leg Dry Gauze Edema Control 3 Layer Compression System - Bilateral void standing for long periods of time - walking is encouraged A Elevate legs to the level of the heart or above for 30 minutes daily and/or when sitting, a frequency of: - do not sleep in chair with feet dangling, MUST elevate legs while sitting Exercise regularly Segmental Compressive Device. - lymphedema pumps 60 minutes 1- 2 times per day Off-Loading Turn and reposition every 2 hours Additional Orders / Instructions Follow Nutritious Diet - T include vitamin A, vitamin C, and Zinc along with increased protein intake. o [facility] for wound care. - [facility] Electronic Signature(s) Signed: 10/19/2019 5:03:53 PM By: [name] RN, BSN Signed: 10/19/2019 6:28:26 PM By: [name] PA-C Entered By: [name] on 10/19/2019 11:06:38 -------------------------------------------------------------------------------- Problem List Details Patient Name: Date of Service: [name]. 10/19/2019 10:00 A M Medical Record Number: [id number] Patient Account Number: [id number] Date of Birth/Sex: Treating RN: 02/24/1951 (69 y.o. [name] Primary Care Provider: [name] Other Clinician: Referring Provider: Treating Provider/Extender: Doyle Askew, FRED Weeks in Treatment: [phone] Active Problems ICD-10 Encounter Code Description Active Date MDM Diagnosis E11.622 Type 2 diabetes mellitus with other skin ulcer 08/05/2017 No Yes I89.0 Lymphedema, not elsewhere classified 08/05/2017 No Yes I87.331 Chronic venous hypertension (idiopathic) with ulcer and  inflammation of right 08/05/2017 No Yes lower  extremity I87.332 Chronic venous hypertension (idiopathic) with ulcer and inflammation of left 08/05/2017 No Yes lower extremity L97.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 leg with fat layer 08/05/2017 No Yes exposed L97.822 Non-pressure chronic ulcer of other part of left lower leg with fat layer exposed2/20/2019 No Yes L97.522 Non-pressure chronic ulcer of other part of left foot with fat layer exposed 06/01/2019 No Yes I10 Essential (primary) hypertension 08/05/2017 No Yes E66.01 Morbid (severe) obesity due to excess calories 08/05/2017 No Yes F41.8 Other specified anxiety disorders 08/05/2017 No Yes R53.1 Weakness 08/05/2017 No Yes Inactive Problems Resolved Problems Electronic Signature(s) Signed: 10/19/2019 10:38:34 AM By: [name] PA-C Entered By: [name] on 10/19/2019 10:38:33 -------------------------------------------------------------------------------- Progress Note Details Patient Name: Date of Service: [name]. 10/19/2019 10:00 A M Medical Record Number: [id number] Patient Account Number: [id number] Date of Birth/Sex: Treating RN: Apr 04, 1951 (69 y.o. [name] Primary Care Provider: [name] Other Clinician: Referring Provider: Treating Provider/Extender: Doyle Askew, FRED Weeks in Treatment: [phone] Subjective Chief Complaint Information obtained from Patient Bilateral reoccurring LE ulcers History of Present Illness (HPI) this patient has been seen a couple of times before and returns with recurrent problems to her right and left lower extremity with swelling and weeping ulcerations due to not wearing her compression stockings which she had been advised to do during her last discharge, at the end of June 2018. During her last visit the patient had had normal arterial blood flow and her venous reflux study did not necessitate any surgical intervention. She was  recommended compression and elevation and wound care. After prolonged treatment the patient was completely healed but she has been noncompliant with wearing or compressions.. She was here last week with an outpatient return visit planned but the patient came in a very poor general condition with altered mental status and was rushed to the ER on my request. With a history of hypertension, diabetes, TIA and right-sided weakness she was set up for an MRI on her brain and cervical spine and was sent to [facility]. Getting an MRI done was very difficult but once the workup was done she was found not to have any spinal stenosis, epidural abscess or hematoma or discitis. This was radiculopathy to be treated as an outpatient and she was given a follow-up appointment. Today she is feeling much better alert and oriented and has come to reevaluate her bilateral lower extremity lymphedema and ulceration 03/25/2017 -- she was admitted to the hospital on 03/16/2017 and discharged on 03/18/2017 with left leg cellulitis and ulceration. She was started on vancomycin and Zosyn and x-ray showed no bony involvement. She was treated for a cellulitis with IV antibiotics changed to Rocephin and Flagyl and was discharged on oral Keflex and doxycycline to complete a 7 day course. Last hemoglobin A1c was 7.1 and her other ailments including hypertension got asthma were appropriately treated. 05/06/2017 -- she is awaiting the right size of compression stockings from  but other than that has been doing well. ====== Old notes 69 year old patient was seen one time last October and was lost to follow-up. She has recurrent problems with weeping and ulceration of her left lower extremity and has swelling of this for several years. It has been worse for the last 2 months. Past medical history is significant for diabetes mellitus type 2, hypertension, gout, morbid obesity, depressive disorders, hiatal hernia, migraines,  status post knee surgery, risk of a cholecystectomy, vaginal hysterectomy and breast biopsy.  She is not a smoker. As noted before she has never had a venous duplex study and an arterial ABI study was attempted but the left lower extremity was noncompressible 10/01/2016 -- had a lower extremity venous duplex reflux evaluation which showed no evidence of deep vein reflux in the right or left lower extremity, and no evidence of great saphenous vein reflux more than 500 ms in the right or left lower extremity, and the left small saphenous vein is incompetent but no vascular consult was recommended. review of her electronic medical records noted that the ABI was checked in July 2017 where the right ABI was normal limits and the left ABI could not be ascertained due to pain with cuff pressure but the waveforms are within normal limits. her arterial duplex study scheduled for April 27. 10/08/2016 -- the patient has various reasons for not having a compression on and for the last 3 days she has had no compression on her left lower extremity either due to pain or the lack of nursing help. She does not use her juxta lites either. 10/15/2016 -- the patient did not keep her appointment for arterial duplex study on April 27 and I have asked her to reschedule this. Her pain is out of proportion with the physical findings and she continuously fails to wear a compression wraps and cuts them off because she says she cannot tolerate the pain. She does not use her juxta lites either. 10/22/2016 -- he has rescheduled her arterial duplex study to May 21 and her pain today is a bit better. She has not been wearing her juxta lites on her right lower extremity but now understands that she needs to do this. She did tolerate the to press compression wrap on her left lower extremity 10/29/2016 --arterial duplex study is scheduled for next week and overall she has been tolerating her compression wraps and also using her juxta  lites on her right lower extremity 11/05/2016 -- the right ABI was 0.95 the left was 1.03. The digit TBI is on the right was 0.83 on the left was 0.92 and she had biphasic flow through these vessels. The impression was that of normal lower extremity arterial study. 11/12/2016 -- her pain is minimal and she is doing very well overall. 11/26/2016 -- she has got juxta lites and her insurance will not pay for additional dual layer compression stockings. She is going to order some from [place]. 05/12/2017 -- her juxta lites are very old and too big for her and these have not been helping with compression. She did get 20-30 mm compression stockings from [place] but she and her husband are unable to put these on. I believe she will benefit from bilateral Extremit-ease, compression stockings and we will measure her for these today. 05/20/2017 -- lymphedema on the left lower extremity has increased a lot and she has a open ulceration as a result of this. The right lower extremity is looking pretty good. She has decided to by the compression stockings herself and will get reimbursed by the home health, at a later date. 05/27/2017 -- her sciatica is bothering her a lot and she thought her left leg pain was caused due to the compression wrap and hence removed it and has significant lymphedema. There is no inflammation on this left lower extremity. 06/17/17 on evaluation today patient appears to be doing very well and in fact is completely healed in regard to her ulcerations. Unfortunately however she does have continued issues with lymphedema nonetheless.  We did order compression garments for her unfortunately she states that the size that she received were large although we ordered medium. Obviously this means she is not getting the optimal compression. She does not have those with her today and therefore we could not confirm and contact the company on her behalf. Nonetheless she does state that she is going to  have her husband bring them by tomorrow so that we can verify and then get in touch with the company. No fevers, chills, nausea, or vomiting noted at this time. Overall patient is doing better otherwise and I'm pleased with the progress she has made. 07/01/17 on evaluation today patient appears to be doing very well in regard to her bilateral lower extremity she does not have any openings at this point which is excellent news. Overall I'm pleased with how things have progressed up to this time. Since she is doing so well we did order her compression which we are seeing her today to ensure that it fits her properly and everything is doing well in that regard and then subsequently she will be discharged. ============ Old Notes: 03/31/16 patient presents today for evaluation concerning open wounds that she has over the left medial ankle region as well as the left dorsal foot. She has previously had this occur although it has been healed for a number of months after having this for about a year prior until her hospitalization on 01/05/16. At that point in time it appears that she was admitted to the hospital for left lower extremity cellulitis and was placed on vancomycin and Zosyn at that point. Eventually upon discharge on January 15, 2016 she was placed on doxycycline at that point in time. Later on 03/27/16 positive wound culture growing Escherichia coli this was switched to amoxicillin. Currently she tells me that she is having pain radiated to be a 7 out of 10 which can be as high as 10 out of 10 with palpation and manipulation of the wound. This wound appears to be mainly venous in nature due to the bilateral lower extremity venous stasis/lymphedema. This is definitely much worse on her left than the right side. She does have type 1 diabetes mellitus, hypertension, morbid obesity, and is wheelchair dependent.during the course of the hospital stay a blood culture was also obtained and fortunately  appeared negative. She also had an x-ray of the tibia/fibula on the left which showed no acute bone abnormality. Her white blood cell count which was performed last on 03/25/16 was 7.3, hemoglobin 12.8, protein 7.1, albumin 3.0. Her urine culture appeared to be negative for any specific organisms. Patient did have a left lower extremity venous duplex evaluation for DVT . This did not include venous reflux studies but fortunately was negative for DVT Patient also had arterial studies performed which revealed that she had a . normal ABI on the right though this was unable to be performed on the left secondary to pain that she was having around the ankle region due to the wound. However it was stated on report that she had biphasic pulses and apparently good blood flow. ========== 06/03/17 she is here in follow-up evaluation for right lower extremity ulcer. The right lower sure he has healed but she has reopened to the left medial malleolus and dorsal foot with weeping. She is waiting for new compression garments to arrive from home health, the previous compression garments were ill fitting. We will continue with compression bilaterally and follow-up in 2 weeks Readmission: 08/05/17 on  evaluation today patient appears to be doing somewhat poorly in regard to her left lower extremity especially although the right lower extremity has a small area which may no longer be open. She has been having a lot of drainage from the left lower extremity however he tells me that she has not been able to use the EXTREMIT-EASE Compression at this point. She states that she did better and was able to actually apply the Juxta-Lite compression although the wound that she has is too large and therefore really does not compress which is why she cannot wear it at this point. She has no one who can help her put it on regular basis her son can sometimes but he's not able to do it most of the time. I do believe that's why she has  begun to weave and have issues as she is currently yet again. No fevers, chills, nausea, or vomiting noted at this time. Patient is no evidence of dementia. 08/12/17 on evaluation today patient appears to still be doing fairly well in regard to the draining areas/weeping areas at this point. With that being said she unfortunately did go to the ER yesterday due to what was felt to be possibly a cellulitis. They place her on doxycycline by mouth and discharge her home. She definitely was not admitted. With that being said she states she has had more discomfort which has been unusual for her even compared to prior times and she's had infections.08/12/17 on evaluation today patient appears to still be doing fairly well in regard to the draining areas/weeping areas at this point. With that being said she unfortunately did go to the ER yesterday due to what was felt to be possibly a cellulitis. They place her on doxycycline by mouth and discharge her home. She definitely was not admitted. With that being said she states she has had more discomfort which has been unusual for her even compared to prior times and she's had infections. 08/19/17 put evaluation today patient tells me that she's been having a lot of what sounds to be neuropathic type pain in regard to her left lower extremity. She has been using over-the-counter topical bins again which some believe. That in order to apply the she actually remove the wrap we put on her last Wednesday on Thursday. Subsequently she has not had anything on compression wise since that time. The good news is a lot of the weeping areas appear to have closed at this point again I believe she would do better with compression but we are struggling to get her to actually use what she needs to at this point. No fevers, chills, nausea, or vomiting noted at this time. 09/03/17 on evaluation today patient appears to be doing okay in regard to her lower extremities in regard to the  lymphedema and weeping. Fortunately she does not seem to show any signs of infection at this point she does have a little bit of weeping occurring in the right medial malleolus area. With that being said this does not appear to be too significant which is good news. 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 She has severe skin damage secondary to both of these features involving the dorsal left foot and medial left ankle and lower leg. Still has open areas in the left anterior foot. The area on the right closed over. She uses her own juxta light stockings. She does not have an arterial issue 09/16/17 on  evaluation today patient actually appears to be doing excellent in regard to her bilateral lower extremity swelling. The Juxta-Lite compression wrap seem to be doing very well for her. She has not however been using the portion that goes over her foot. Her left foot still is draining a little bit not nearly as significant as it has been in the past but still I do believe that she likely needs to utilize the full wrap including the foot portion of this will improve as well. She also has been apparently putting on a significant amount of Vaseline which also think is not helpful for her. I recommended that if she feels she needs something for moisturizer Eucerin will probably be better. 09/30/17 on evaluation today patient presents with several new open areas in regard to her left lower extremity although these appear to be minimal and mainly seem to be more moisture breakdown than anything. Fortunately she does not seem to have any evidence of infection which is great news. She has been tolerating the dressing changes without complication we are using silver alginate on the foot she has been using AB pads to have the legs and using her Juxta- Lite compression which seems to be controlling her swelling very well. Overall I'm pleased with  the poor way she has progressed. 10/14/17 on evaluation today patient appears to be doing better in regard to her left lower extremity areas of weeping. She does still have some discomfort although in general this does not appear to be as macerated and I think it is progressing nicely. I do think she still needs to wear the foot portion of her Juxta- Lite in order to get the most benefit from the wrap obviously. She states she understands. Fortunately there does not appear to be evidence of infection at this time which is great news. 10/28/17 on evaluation today patient appears to be doing excellent in regard to her left lower extremity. She has just a couple areas that are still open and seem to be causing any trouble whatsoever. For that reason I think that she is definitely headed in the right direction the spots are very tiny compared to what we have been dealing with in the past. 11/11/17 on evaluation today patient appears to have a right lateral lower extremity ulcer that has opened since I last saw her. She states this is where the home health nurse that was coming out remove the dressing without wetting the alginate first. Nonetheless I do not know if this is indeed the case or not but more importantly we have not ordered home help to be coming out for her wounds at all. I'm unsure as to why they are coming out and we're gonna have to check on this and get things situated in that regard. With that being said we currently really do not need them to be coming out as the patient has been taking care of her leg herself without complication and no issues. In fact she was doing much better prior to nursing coming out. 11/25/17 on evaluation today patient actually appears to be doing fairly well in regard to her left lower extremity swelling. In fact she has very little area of weeping at this point there's just a small spot on the lateral portion of her right leg that still has me just a little bit more  concerned as far as wanting to see this clear up before I discharge her to caring for this at home. Nonetheless overall she has  made excellent progress. 12/09/17 on evaluation today patient appears to be doing rather well in regard to her lower extremity edema. She does have some weeping still in the left lower extremity although the big area we were taking care of two weeks ago actually has closed and she has another area of weeping on the left lower extremity immediately as well is the top of her foot. She does not currently have lymphedema pumps she has been wearing her compression daily on a regular basis as directed. With that being said I think she may benefit from lymphedema pumps. 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 We had a very difficult time getting and keeping this under control. 12/23/17 on evaluation today patient actually appears to be doing a little bit more poorly in regard to her bilateral lower extremities. She has been tolerating the Juxta-Lite compression wraps. Unfortunately she has two new ulcers on the right lower extremity and left lower Trinity ulceration seems to be larger. Obviously this is not good news. She has been tolerating the dressings without complication. 12/30/17 on evaluation today patient actually appears to be doing much better in regard to her bilateral lower extremity edema. She continues to have some issues with ulcerations and in fact there appears to be one spot on each leg where the wrap may have caused a little bit of a blister which is subsequently opened up at this point is given her pain. Fortunately it does not appear to be any evidence of infection which is good news. No fevers chills noted. 01/13/18 on evaluation today patient appears to be doing rather well in regard to her bilateral lower  extremities. The dressings did get kind of stuck as far as the wound beds are concerned but again I think this is mainly due to the fact that she actually seems to be showing signs of healing which is good news. She's not having as much drainage therefore she was having more of the dressing sticking. Nonetheless overall I feel like her swelling is dramatically down compared to previous. 01/20/18 on evaluation today patient unfortunately though she's doing better in most regards has a large blister on the left anterior lower extremity where she is draining quite significantly. Subsequently this is going to need debridement today in order to see what's underneath and ensure she does not continue to trapping fluid at this location. Nonetheless No fevers, chills, nausea, or vomiting noted at this time. 01/27/18 on evaluation today patient appears to be doing rather well at this point in regard to her right lower extremity there's just a very small area that she still has open at this point. With that being said I do believe that she is tolerating the compression wraps very well in making good progress. Home health is coming out at this point to see her. Her left lower extremity on the lateral portion is actually what still mainly open and causing her some discomfort for the most part 02/10/18 on evaluation today patient actually appears to be doing very well in regard to her right lower extremity were all the ulcers appear to be completely close. In regard to the left lower extremity she does have two areas still open and some leaking from the dorsal surface of her foot but this still seems to be doing much better to me in general.  02/24/18 on evaluation today patient actually appears to be doing much better in regard to her right lower extremity this is still completely healed. Her left lower extremity is also doing much better fortunately she has no evidence of infection. The one area that is gonna require  some debridement is still on the left anterior shin. Fortunately this is not hurting her as badly today. 03/10/18 on evaluation today patient appears to be doing better in some regards although she has a little bit more open area on the dorsal foot and she also has some issues on the medial portion of the left lower extremity which is actually new and somewhat deep. With that being said there fortunately does not appear to be any significant signs of infection which is good news. No fevers, chills, nausea, or vomiting noted at this time. In general her swelling seems to be doing fairly well which is good news. 03/31/18 on evaluation today patient presents for follow-up concerning her left lower extremity lymphedema. Unfortunately she has been doing a little bit more poorly since I last saw her in regard to the amount of weeping that she is experiencing. She's also having some increased pain in the anterior shin location. Unfortunately I do not feel like the patient is making such good progress at this point a few weeks back she was definitely doing much better. 04/07/18 on evaluation today patient actually appears to be showing some signs of improvement as far as the left lower extremity is concerned. She has been tolerating the dressing changes and it does appear that the Drawtex did better for her. With that being said unfortunately home health is stating that they cannot obtain the Drawtex going forward. Nonetheless we're gonna have to check and see what they may be able to get the alginate they were using was getting stuck in causing new areas of skin being pulled all that with and subsequently weep and calls her to worsen overall this is the first time we've seen improvement at this time. 04/14/18 on evaluation today patient actually appears to be doing rather well at this point there does not appear to be any evidence of infection at this time and she is actually doing excellent in regard to the  weeping in fact she almost has no openings remaining even compared to just last week this is a dramatic improvement. No fevers chills noted 04/21/18 evaluation today patient actually appears to be doing very well. She in fact is has a small area on the posterior lower extremity location and she has a small area on the dorsal surface of her foot that are still open both of which are very close to closing. We're hoping this will be close shortly. She brought her Juxta-Lite wrap with her today hoping that would be able to put her in it unfortunately I don't think were quite at that point yet but we're getting closer. 04/28/18 upon evaluation today patient actually appears to be doing excellent in regard to her left lower extremity ulcer. In fact the region on the posterior lower extremity actually is much smaller than previously noted. Overall I'm very happy with the progress she has made. She again did bring her Juxta-Lite although we're not quite ready for that yet. 05/11/18 upon evaluation today patient actually appears to be doing in general fairly well in regard to her left lower Trinity. The swelling is very well controlled. With that being said she has a new area on the left anterior lower extremity  as well as between the first and second toes of her left foot that was not present during the last evaluation. The region of her posterior left lower extremity actually appears to be almost completely healed. T be honest I'm very pleased with o the way that stands. Nonetheless I do believe that the lotion may be keeping the area to moist as far as her legs are concerned subsequently I'm gonna consider discontinuing that today. 05/26/18 on evaluation today patient appears to be doing rather well in regard to her left lower should be ulcers. In fact everything appears to be close except for a very small area on the left posterior lower extremity. Fortunately there does not appear to be any evidence of  infection at this time. Overall very pleased with her progress. 06/02/18 and evaluation today patient actually appears to be doing very well in regard to her lower extremity ulcers. She has one small area that still continues to weep that I think may benefit her being able to justify lotion and user Juxta-Lite wraps versus continued to wrap her. Nonetheless I think this is something we can definitely look into at this point. 06/23/18 on evaluation today patient unfortunately has openings of her bilateral lower extremities. In general she seems to be doing much worse than when I last saw her just as far as her overall health standpoint is concerned. She states that her discomfort is mainly due to neuropathy she's not having any other issues otherwise. No fevers, chills, nausea, or vomiting noted at this time. 06/30/18 on evaluation today patient actually appears to be doing a little worse in regard to her right lower extremity her left lower extremity of doing fairly well. Fortunately there is no sign of infection at this time. She has been tolerating the dressing changes without complication. Home health did not come out like they were supposed to for the appropriate wrap changes. They stated that they never received the orders from [place] which were fax. Nonetheless we will send a copy of the orders with the patient today as well. 07/07/18 on evaluation today patient appears to be doing much better in regard to lower extremities. She still has several openings bilaterally although since I last saw her her legs did show obvious signs of infection when she later saw her nurse. Subsequently a culture was obtained and she is been placed on Bactrim and Keflex. Fortunately things seem to be looking much better it does appear she likely had an infection. Again last week we'd even discussed it but again there really was not any obvious sign that she had infection therefore we held off on the antibiotics. Nonetheless  I'm glad she's doing better today. 07/14/18 on evaluation today patient appears to be doing much better regarding her bilateral lower Trinity's. In fact on the right lower for me there's nothing open at this point there are some dry skin areas at the sites where she had infection. Fortunately there is no evidence of systemic infection which is excellent news. No fevers chills noted 07/21/18 on evaluation today patient actually appears to be doing much better in regard to her left lower extremity ulcers. She is making good progress and overall I feel like she's improving each time I see her. She's having no pain I do feel like the infection is completely resolved which is excellent news. No fevers, chills, nausea, or vomiting noted at this time. 07/28/18 on evaluation today patient appears to be doing very well in regard to her  left lower [employer]. Everything seems to be showing signs of improvement which is excellent news. Overall very pleased with the progress that has been made. Fortunately there's no evidence of active infection at this time also excellent news. 08/04/18 on evaluation today patient appears to be doing more poorly in regard to her bilateral lower extremities. She has two new areas open up on the right and these were completely closed as of last week. She still has the two spots on the left which in my pinion seem to be doing better. Fortunately there's no evidence of infection again at this point. 08/11/18 on evaluation today patient actually appears to be doing very well in regard to her bilateral lower Trinity wounds that all seem to be doing better and are measures smaller today. Fortunately there's no signs of infection. No fevers, chills, nausea, or vomiting noted at this time. 08/18/18 on evaluation today patient actually appears to be doing about the same inverter bilateral lower extremities. She continues to have areas that blistering open as was drain that fortunately  nothing too significant. Overall I feel like Drawtex may have done better for her however compared to the collagen. 08/25/18 on evaluation today patient appears to be doing a little bit more poorly today even compared to last time I saw her. Again I'm not exactly sure why she's making worse progress over the past several weeks. I'm beginning to wonder if there is some kind of underlying low level infection causing this issue. I did actually take a culture from the left anterior lower extremity but it was a new wound draining quite a bit at this point. Unfortunately she also seems to be having more pain which is what also makes me worried about the possibility of infection. This is despite never erythema noted at this point. 09/01/18 on evaluation today patient actually appears to be doing a little worse even compared to last week in regard to bilateral lower extremities. She did go to the hospital on the 16th was given a dose of IV Zosyn and then discharged with a recommendation to continue with the Bactrim that I previously prescribed for her. Nonetheless she is still having a lot of discomfort she tells me as well at this time. This is definitely unfortunate. No fevers, chills, nausea, or vomiting noted at this time. 09/08/18 on evaluation today patient's bilateral lower extremities actually appear to be shown signs of improvement which is good news. Fortunately there does not appear to be any signs of active infection I think the anabiotic is helping in this regard. Overall I'm very pleased with how she is progressing. 09/15/18 patient was actually seen in ER yesterday due to her legs as well unfortunately. She states she's been having a lot of pain and discomfort as well as a lot of drainage. Upon inspection today the patient does have a lot of swelling and drainage I feel like this is more related to lymphedema and poor fluid control than it is to infection based on what I'm seeing. The physician in the  emergency department also doubted that the patient was having a significant infection nonetheless I see no evidence of infection obvious at this point although I do see evidence of poor fluid control. She still not using a compression pumps, she is not elevating due to her lift chair as well as her hospital bed being broken, and she really is not keeping her legs up as much as they should be and also has been taking  off her wraps. All this combined I think has led to poor fluid control and to be honest she may be somewhat volume overloaded in general as well. I recommend that she may need to contact your physician to see if a prescription for a diuretic would be beneficial in their opinion. As long as this is safe I think it would likely help her. 09/29/18 on evaluation today patient's left lower extremity actually appears to be doing quite a bit better. At least compared to last time that I saw her. She still has a large area where she is draining from but there's a lot of new skin speckled trout and in fact there's more new skin that there are open areas of weeping and drainage at this point. This is good news. With regard to the right lower extremity this is doing much better with the only open area that I really see being a dry spot on the right lateral ankle currently. Fortunately there's no signs of active infection at this time which is good news. No fevers, chills, nausea, or vomiting noted at this time. The patient seems somewhat stressed and overwhelmed during the visit today she was very lethargic as such. She does and she is not taking any pain medications at this point. Apparently according to her husband are also in the process of moving which is probably taking its toll on her as well. 10/06/18 on evaluation today patient appears to be doing rather well in regard to her lower extremities compared to last evaluation. Fortunately there's no signs of active infection. She tells me she did  have an appointment with her primary. Nonetheless he was concerned that the wounds were somewhat deep based on pictures but we never actually saw her legs. She states that he had her somewhat worried due to the fact that she was fearing now that she was [place] have to have an amputation. With that being said based on what I'm seeing check she looks better this week that she has the last two times I've seen her with much less drainage I'm actually pleased in this regard. That doesn't mean that she's out of the water but again I do not think what the point of talking about education at all in regard to her leg. She is very happy to hear this. She is also not having as much pain as she was having last week. 10/13/18 unfortunately on evaluation today patient still continues to have a significant amount of drainage she's not letting home health actually apply the compression dressings at this point. She's trying to use of Juxta-Lite of the top of Kerlex and the second layer of the three layer compression wrap. With that being said she just does not seem to be making as good a progress as I would expect if she was having the compression applied and in place on a regular basis. No fevers, chills, nausea, or vomiting noted at this time. 10/20/18 on evaluation today patient appears to be doing a little better in regard to her bilateral lower extremity ulcers. In fact the right lower extremity seems to be healed she doesn't even have any openings at this point left lower extremity though still somewhat macerated seems to be showing signs of new skin growth at multiple locations throughout. Fortunately there's no evidence of active infection at this time. No fevers, chills, nausea, or vomiting noted at this time. 10/27/18 on evaluation today patient appears to be doing much better in regard to her  left lower Trinity ulcer. She's been tolerating the laptop complication and has minimal drainage noted at this point.  Fortunately there's no signs of active infection at this time. No fevers, chills, nausea, or vomiting noted at this time. 11/03/18 on evaluation today patient actually appears to be doing excellent in regard to her left lower extremity. She is having very little drainage at this point there does not appear to be any significant signs of infection overall very pleased with how things have gone. She is likewise extremely pleased still and seems to be making wonderful progress week to week. I do believe antibiotics were helpful for her. Her primary care provider did place on amateur clean since I last saw her. 11/17/18 on evaluation today patient appears to be doing worse in regard to her bilateral lower extremities at this point. She is been tolerating the dressing changes without complication. With that being said she typically takes the [place] off fairly quickly upon arriving home even after being seen here in the clinic and does not allow home health reapply command as part of the dressing at home. Therefore she said no compression essentially since I last saw her as best I can tell. With that being said I think it shows and how much swelling she has in the open wounds that are noted at this point. Fortunately there's no signs of infection but unfortunately if she doesn't get this under control I think she will end up with infection and more significant issues. 11/24/18 on evaluation today patient actually appears to be doing somewhat better in regard to her bilateral lower extremities. 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 Nonetheless that is doing better currently so she should be able to attempt reinitiating the lymphedema pumps at this time. No fevers, chills, nausea, or vomiting noted at this time. 12/01/18 upon evaluation today patient's left lower  extremity appears to be doing quite well unfortunately her right lower extremity is not doing nearly as well. She has been tolerating the dressing changes without complication unfortunately she did not keep a wrap on the right at this time. Nonetheless I believe this has led to increased swelling and weeping in the world is actually much larger than during the last evaluation with her. 12/08/18 on evaluation today patient appears to be doing about the same at this point in regard to her right lower extremity. There is some more palatable to touch I'm concerned about the possibility of there being some infection although I think the main issue is she's not keeping her compression wrap on which in turn is not allowing this area to heal appropriately. 12/22/18 on evaluation today patient appears to be doing better in regard to left lower extremity unfortunately significantly worse in regard to the right lower extremity. The areas of blistering and necrotic superficial tissue have spread and again this does not really appear to be signs of infection and all she just doesn't seem to be doing nearly as well is what she has been in the past. Overall I feel like the Augmentin did absolutely nothing for her she doesn't seem to have any infection again I really didn't think so last time either is more of a potential preventative measure and hoping that this would make some difference but I think the main issue is she's  not wearing her compression. She tells me she cannot wear the [place] been we put on she takes it off pretty much upon getting home. Subsequently she worshiped Juxta-Lite when I questioned her about how often she wears it this is no more than three hours a day obviously that leaves 21 hours that she has no compression and this is obviously not doing well for her. Overall I'm concerned that if things continue to worsen she is at great risk of both infection as well as losing her leg. 01/05/19 on  evaluation today patient appears to be doing well in regard to her left lower extremity which he is allowing [place] to wrap and not so well with regard to her right lower extremity which she is not allowing [place] to really wrap and keep the wrap on. She states that it hurts too badly whenever it's wrapped and she ends up having to take it off. She's been using the Juxta-Lite she tells me up to six hours a day although I question whether or not that's really been the case to be honest. Previously she told me three hours today nonetheless obviously the legs as long as the wrap is doing great when she is not is doing much more poorly. 01/12/2019 on evaluation today patient actually appears to be doing a little better in my opinion with regard to her right lower extremity ulcer. She has a small open area on the left lower extremity unfortunately but again this I think is part of the normal fluctuation of what she is going to have to expect with regard to her legs especially when she is not using her lymphedema pumps on a regular basis. Subsequently based on what I am seeing today I think that she does seem to be doing slightly better with regard to her right lower extremity she did see her primary care provider on Monday they felt she had an infection and placed her on 2 antibiotics. Both Cipro and clindamycin. Subsequently again she seems possibly to be doing a little bit better in regards to the right lower extremity she also tells me however she has been wearing the compression wrap over the past week since I spoke with her as well that is a Kerlix and Coban wrap on the right. No fevers, chills, nausea, vomiting, or diarrhea. 01/19/2019 on evaluation today patient appears to be doing better with regard to her bilateral lower extremities especially the right. I feel like the compression has been beneficial for her which is great news. She did get a call from her primary care provider on her way here today telling  her that she did have methicillin- resistant Staphylococcus aureus and he was calling in a couple new antibiotics for her including a ointment to be applied she tells me 3 times a day. With that being said this sounds like likely to be Bactroban which I think could be applied with each dressing/wrap change but I would not be able to accommodate her applying this 3 times a day. She is in agreement with the least doing this we will add that to her orders today. 01/26/2019 on evaluation today patient actually appears to be doing much better with regard to her right lower extremity. Her left lower extremity is also doing quite well all things considering. Fortunately there is no evidence of active infection at this time. No fevers, chills, nausea, vomiting, or diarrhea. 02/02/2019 on evaluation today patient appears to be doing much better compared to her last  evaluation. Little by little off like her right leg is returning more towards normal. There does not appear to be any signs of active infection and overall she seems to be doing quite well which is great news. I am very pleased in this regard. No fevers, chills, nausea, vomiting, or diarrhea. 02/09/2019 upon evaluation today patient appears to be doing better with regard to her bilateral lower extremities. She has been tolerating the dressing changes without complication. Fortunately there is no signs of active infection at this time. No fevers, chills, nausea, vomiting, or diarrhea. 02/23/2019 on evaluation today patient actually appears to be doing quite well with regard to her bilateral lower extremities. She has been tolerating the dressing changes without complication. She is even used her pumps one time and states that she really felt like it felt good. With that being said she seems to be in good spirits and her legs appear to be doing excellent. 03/09/2019 on evaluation today patient appears to be doing well with regard to her right lower extremity  there are no open wounds at this time she is having some discomfort but I feel like this is more neuropathy than anything. With regard to her left lower extremity she had several areas scattered around that she does have some weeping and drainage from but again overall she does not appear to be having any significant issues and no evidence of infection at this time which is good news. 03/23/2019 on evaluation today patient appears to be doing well with regard to her right lower extremity which she tells me is still close she is using her juxta light here. Her left lower extremity she mainly just has an area on the foot which is still slightly draining although this also is doing great. Overall very pleased at this time. 04/06/2019 patient appears to be doing a little bit worse in regard to her left lower extremity upon evaluation today. She feels like this could be becoming infected again which she had issues with previous. Fortunately there is no signs of systemic infection but again this is always a struggle with her with her legs she will go from doing well to not so well in a very short amount of time. 04/20/2019 on evaluation today patient actually appears to be doing quite well with regard to her right lower extremity I do not see any signs of active infection at this time. Fortunately there is no fever chills noted. She is still taking the antibiotics which I prescribed for her at this point. In regard to the left lower extremity I do feel like some of these areas are better although again she still is having weeping from several locations at this time. 04/27/2019 on evaluation today patient appears to be doing about the same if not slightly worse in regard to her left lower extremity ulcers. She tells me when questioned that she has been sleeping in her Hoveround chair in fact she tells me she falls asleep without even knowing it. I think she is spending a whole lot of time in the chair and less  time walking and moving around which is not good for her legs either. On top of that she is in a seated position which is also the worst position she is not really elevating her legs and she is also not using her lymphedema pumps. All this is good to contribute to worsening of her condition in general. 05/18/2019 on evaluation today patient appears to be doing well with regard  to her lower extremity on the right in fact this is showing no signs of any open wounds at this time. On the left she is continuing to have issues with areas that do drain. Some of the regions have healed and there are couple areas that have reopened. She did go to the ER per the patient according to recommendations from the home health nurse due to what she was seen when she came out on 05/13/2019. Subsequently she felt like the patient needed to go to the hospital due to the fact that again she was having "milky white discharge" from her leg. Nonetheless she had and then was placed on doxycycline and subsequently seems to be doing better. 06/01/2019 upon evaluation today patient appears to be doing really in my opinion about the same. I do not see any signs of active infection which is good news. Overall she still has wounds over the bilateral lower extremities she has reopened on the right but this appears to be more of a crack where there is weeping/edema coming from the region. I do not see any evidence of infection at either site based on what I visualized today. 07/13/2019 upon evaluation today patient appears to be doing a little worse compared to last time I saw her. She since has been in the hospital from 06/21/2019 through 06/29/2019. This was secondary to having Covid. During that time they did apply lotion to her legs which unfortunately has caused her to develop a myriad of open wounds on her lower extremities. Her legs do appear to be doing better as far as the overall appearance is concerned but nonetheless she  does have more open and weeping areas. 07/27/2019 upon evaluation today patient appears to be doing more poorly to be honest in regard to her left lower extremity in particular. There is no signs of systemic infection although I do believe she may have local infection. She notes she has been having a lot of blue/green drainage which is consistent potentially with Pseudomonas. That may be something that we need to consider here as well. The doxycycline does not seem to have been helping. 08/03/2019 upon evaluation today patient appears to be doing a little better in my opinion compared to last week's evaluation. Her culture I did review today and she is on appropriate medications to help treat the Enterobacter that was noted. Overall I feel like that is good news. With that being said she is unfortunately continuing to have a lot of drainage and though it is doing better I still think she has a long ways to go to get things dried up in general. Fortunately there is no signs of systemic infection. 08/10/2019 upon evaluation today patient appears to be doing may be slightly better in regard to her left lower extremity the right lower extremity is doing much better. Fortunately there is no signs of infection right now which is good news. No fevers, chills, nausea, vomiting, or diarrhea. 08/24/2019 on evaluation today patient appears to be doing slightly better in regard to her lower extremities. The left lower extremity seems to be healed the right lower extremity is doing better though not completely healed as far as the openings are concerned. She has some generalized issues here with edema and weeping secondary to her lymphedema though again I do believe this is little bit drier compared to prior weeks evaluations. In general I am very pleased with how things seem to be progressing. No fevers, chills, nausea, vomiting, or diarrhea. 08/31/2019  upon evaluation today patient actually seems to making some  progress here with regard to the left lower extremity in particular. She has been tolerating the dressing changes without complication. Fortunately there is no signs of active infection at this time. No fevers, chills, nausea, vomiting, or diarrhea. She did see Dr. Doren Custard and he did note that she did have a issue with the left great saphenous vein and the small saphenous vein in the leg. With that being said he was concerned about the possibility of laser ablation not being extremely successful. He also mentioned a small risk of DVT associated with the procedure. However if the wounds do not continue to improve he stated that that would probably be the way to go. Fortunately the patient's legs do seem to be doing much better. 09/07/2019 upon evaluation today patient appears to be doing better with regard to her lower extremities. She has been tolerating the dressing changes without complication. With that being said she is showing signs of improvement and overall very pleased. There are some areas on her leg that I think we do need to debride we discussed this last week the patient is in agreement with doing that as long as it does not hurt too badly. 09/14/2019 upon evaluation today patient appears to be doing decently well with regard to her left lower extremity. She is not having near as much weeping as she has had in the past things seem to be drying up which is good news. There is no signs of active infection at this time. 09/21/19 upon evaluation today patient appears to be doing better in regard overall to her bilateral lower extremities. She again has less open than she did previous and each week I feel like this is getting better. Fortunately there is no signs of active infection at this time. No fevers, chills, nausea, vomiting, or diarrhea. 09/28/2019 upon evaluation today patient actually appears to be showing signs of improvement with regard to her left lower extremity. Unfortunately the  right medial lower extremity around the ankle region has reopened to some degree but this appears to be minimal still which is good news. There is no signs of active infection at this time which is also good news. 10/12/2019 upon evaluation today patient appears to be doing okay with regard to her bilateral lower extremities today. The right is a little bit worse then last evaluation 2 weeks ago. The left is actually doing a little better in my opinion. Overall there is no signs of active infection at this time that I see. Obviously that something we have to keep a close eye on she is very prone to this with the significant and multiple openings that she has over the bilateral lower extremities. 10/19/2019 upon evaluation today patient appears to be doing about the best that I have seen her in quite some time. She has been tolerating the dressing changes without complication. There does not appear to be any signs of active infection and overall I am extremely happy with the way her legs appeared. She is drying up quite nicely and overall is having less pain. Objective Constitutional Obese and well-hydrated in no acute distress. Vitals Time Taken: 10:10 AM, Height: 62 in, Weight: 335 lbs, BMI: 61.3, Temperature: 98.2 F, Pulse: 88 bpm, Respiratory Rate: 18 breaths/min, Blood Pressure: 131/72 mmHg. Respiratory normal breathing without difficulty. Psychiatric this patient is able to make decisions and demonstrates good insight into disease process. Alert and Oriented x 3. pleasant and cooperative. General  Notes: Upon inspection patient's wound bed actually showed signs of good granulation at this time. Fortunately there is no evidence of active infection which is great news. No fevers, chills, nausea, vomiting, or diarrhea. Integumentary (Hair, Skin) Wound #61 status is Open. Original cause of wound was Gradually Appeared. The wound is located on the Left,Circumferential Lower Leg. The  wound measures 10cm length x 22.5cm width x 0.3cm depth; 176.715cm^2 area and 53.014cm^3 volume. There is Fat Layer (Subcutaneous Tissue) Exposed exposed. There is no tunneling or undermining noted. There is a large amount of serous drainage noted. The wound margin is flat and intact. There is medium (34-66%) pink, pale granulation within the wound bed. There is a medium (34-66%) amount of necrotic tissue within the wound bed including Adherent Slough. Wound #64 status is Open. Original cause of wound was Gradually Appeared. The wound is located on the Left,Dorsal Foot. The wound measures 5.8cm length x 7cm width x 0.1cm depth; 31.887cm^2 area and 3.189cm^3 volume. There is Fat Layer (Subcutaneous Tissue) Exposed exposed. There is no tunneling or undermining noted. There is a medium amount of serosanguineous drainage noted. The wound margin is flat and intact. There is medium (34-66%) pink, pale granulation within the wound bed. There is a medium (34-66%) amount of necrotic tissue within the wound bed including Adherent Slough. Wound #65 status is Open. Original cause of wound was Gradually Appeared. The wound is located on the Right,Medial Lower Leg. The wound measures 1cm length x 2cm width x 0.1cm depth; 1.571cm^2 area and 0.157cm^3 volume. There is Fat Layer (Subcutaneous Tissue) Exposed exposed. There is no tunneling or undermining noted. There is a medium amount of serosanguineous drainage noted. The wound margin is distinct with the outline attached to the wound base. There is large (67-100%) red, pink granulation within the wound bed. There is a small (1-33%) amount of necrotic tissue within the wound bed including Adherent Slough. 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 There was a Three Layer Compression Therapy Procedure by Carlene Coria, RN. Post procedure Diagnosis Wound #61: Same as Pre-Procedure Wound #65 Pre-procedure diagnosis of Wound #65 is a Venous Leg Ulcer located on the Right,Medial Lower Leg . There was a Three Layer Compression Therapy Procedure by Carlene Coria, RN. Post procedure Diagnosis Wound #65: Same as Pre-Procedure Plan Follow-up Appointments: Return Appointment in 2 weeks. Dressing Change Frequency: Wound #61 Left,Circumferential Lower Leg: Change dressing three times week. Wound #64 Left,Dorsal Foot: Change dressing three times week. Wound #65 Right,Medial Lower Leg: Change dressing three times week. Skin Barriers/Peri-Wound Care: Barrier cream - zinc oxide cream to any macerated areas Moisturizing lotion Wound Cleansing: Clean wound with Wound Cleanser - all wounds May shower with protection. Primary Wound Dressing: Wound #61 Left,Circumferential Lower Leg: Calcium Alginate with Silver Wound #64 Left,Dorsal Foot: Calcium Alginate with Silver Wound #65 Right,Medial Lower Leg: Calcium Alginate  with Silver Secondary Dressing: Wound #61 Left,Circumferential Lower Leg: Dry Gauze - all wounds ABD pad - as needed Wound #64 Left,Dorsal Foot: Dry Gauze - all wounds ABD pad - as needed Wound #65 Right,Medial Lower Leg: Dry Gauze Edema Control: 3 Layer Compression System -  Bilateral Avoid standing for long periods of time - walking is encouraged Elevate legs to the level of the heart or above for 30 minutes daily and/or when sitting, a frequency of: - do not sleep in chair with feet dangling, MUST elevate legs while sitting Exercise regularly Segmental Compressive Device. - lymphedema pumps 60 minutes 1- 2 times per day Off-Loading: Turn and reposition every 2 hours Additional Orders / Instructions: Follow Nutritious Diet - T include vitamin A, vitamin C, and Zinc along with increased protein intake. o Home Health: [facility]. - [facility]. My suggestion at this time is good to be that we go ahead and initiate treatment with a continuation of the silver alginate dressing that I been very pleased with up to this point. 2. We will also continue with the compression wraps bilaterally this seems to be doing well for her. 3. I would also recommend she continue to use her lymphedema pumps as well as elevate her legs. We will see patient back for reevaluation in 2 weeks here in the clinic. If anything worsens or changes patient will contact our office for additional recommendations. Electronic Signature(s) Signed: 10/19/2019 6:24:42 PM By: [name] PA-C Entered By: [name] on 10/19/2019 18:24:42 -------------------------------------------------------------------------------- SuperBill Details Patient Name: Date of Service: [name] 10/19/2019 Medical Record Number: [id number] Patient Account Number: [id number] Date of Birth/Sex: Treating RN: [birth year] (69 y.o. [name] Primary Care Provider: [name] Other Clinician: Referring Provider: Treating Provider/Extender: Doyle Askew, FRED Weeks in Treatment: 115 Diagnosis Coding ICD-10 Codes Code Description E11.622 Type 2 diabetes mellitus with other skin ulcer I89.0 Lymphedema, not elsewhere classified I87.331 Chronic venous  hypertension (idiopathic) with ulcer and inflammation of right lower extremity I87.332 Chronic venous hypertension (idiopathic) with ulcer and inflammation of left lower extremity L97.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 leg with fat layer exposed L97.822 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 I10 Essential (primary) hypertension E66.01 Morbid (severe) obesity due to excess calories F41.8 Other specified anxiety disorders R53.1 Weakness Facility Procedures The patient participates with Medicare or their insurance follows the Medicare Facility Guidelines: CPT4 Description Modifier Quantity Code [id number] BILATERAL: Application of multi-layer venous compression system; leg (below knee), including ankle and 1 foot. Physician Procedures : CPT4 Code Description Modifier [id number] - WC PHYS LEVEL 3 - EST PT ICD-10 Diagnosis Description E11.622 Type 2 diabetes mellitus with other skin ulcer I89.0 Lymphedema, not elsewhere classified I87.331 Chronic venous hypertension (idiopathic) with  ulcer and inflammation of right lower extremity I87.332 Chronic venous hypertension (idiopathic) with ulcer and inflammation of left lower extremity Quantity: 1 Electronic Signature(s) Signed: 10/19/2019 6:25:14 PM By: [name] PA-C Previous Signature: 10/19/2019 5:03:53 PM Version By: [name] RN, BSN Entered By: [name] on 10/19/2019 18:25:11

## 2019-11-02 ENCOUNTER — Encounter (HOSPITAL_BASED_OUTPATIENT_CLINIC_OR_DEPARTMENT_OTHER): Payer: Medicare PPO | Admitting: Physician Assistant

## 2019-11-07 NOTE — Progress Notes (Signed)
KETRINA, BOATENG ([id number]) Visit Report for 09/28/2019 Arrival Information Details Patient Name: Date of Service: LADORIS, LYTHGOE 09/28/2019 10:30 A M Medical Record Number: [id number] Patient Account Number: [id number] Date of Birth/Sex: Treating RN: 1950-06-24 (69 y.o. Martyn Malay, Linda Primary Care Yvonna Brun: [name] Other Clinician: Referring Letcher Schweikert: Treating Fannie Gathright/Extender: Doyle Askew, FRED Weeks in Treatment: 112 Visit Information History Since Last Visit Added or deleted any medications: No Patient Arrived: Wheel Chair Any new allergies or adverse reactions: No Arrival Time: 09:58 Had a fall or experienced change in No Accompanied By: self activities of daily living that may affect Transfer Assistance: None risk of falls: Patient Identification Verified: Yes Signs or symptoms of abuse/neglect since last visito No Secondary Verification Process Completed: Yes Hospitalized since last visit: No Patient Requires Transmission-Based Precautions: No Implantable device outside of the clinic excluding No Patient Has Alerts: Yes cellular tissue based products placed in the center Patient Alerts: R ABI= 1.01 since last visit: L ABI = .99 Has Dressing in Place as Prescribed: Yes Pain Present Now: Yes Electronic Signature(s) Signed: 09/28/2019 2:20:25 PM By: Sandre Kitty Entered By: Sandre Kitty on 09/28/2019 09:59:17 -------------------------------------------------------------------------------- Compression Therapy Details Patient Name: Date of Service: [name] 09/28/2019 10:30 A M Medical Record Number: [id number] Patient Account Number: [id number] Date of Birth/Sex: Treating RN: Jul 21, 1950 (69 y.o. [name] Primary Care Veva Grimley: [name] Other Clinician: Referring Timika Muench: Treating Elham Fini/Extender: Doyle Askew, FRED Weeks in Treatment: 112 Compression Therapy Performed for Wound Assessment: Wound #61  Left,Circumferential Lower Leg Performed By: Clinician Deon Pilling, RN Compression Type: Three Layer Post Procedure Diagnosis Same as Pre-procedure Electronic Signature(s) Signed: 09/28/2019 5:43:24 PM By: [name] RN, BSN Entered By: [name] on 09/28/2019 10:54:02 -------------------------------------------------------------------------------- Compression Therapy Details Patient Name: Date of Service: [name] 09/28/2019 10:30 A M Medical Record Number: [id number] Patient Account Number: [id number] Date of Birth/Sex: Treating RN: 12-20-1950 (69 y.o. [name] Primary Care Seraj Dunnam: [name] Other Clinician: Referring Irie Dowson: Treating Sharine Cadle/Extender: Doyle Askew, FRED Weeks in Treatment: 112 Compression Therapy Performed for Wound Assessment: Wound #64 Left,Dorsal Foot Performed By: Clinician Deon Pilling, RN Compression Type: Three Layer Post Procedure Diagnosis Same as Pre-procedure Electronic Signature(s) Signed: 09/28/2019 5:43:24 PM By: [name] RN, BSN Entered By: [name] on 09/28/2019 10:54:03 -------------------------------------------------------------------------------- Encounter Discharge Information Details Patient Name: Date of Service: [name] 09/28/2019 10:30 A M Medical Record Number: [id number] Patient Account Number: [id number] Date of Birth/Sex: Treating RN: [birth year] (69 y.o. [name] Primary Care Mackenna Kamer: [name] Other Clinician: Referring Dwayn Moravek: Treating Zoriah Pulice/Extender: Doyle Askew, FRED Weeks in Treatment: [phone] Encounter Discharge Information Items Post Procedure Vitals Discharge Condition: Stable Temperature (F): 98.8 Ambulatory Status: Wheelchair Pulse (bpm): 113 Discharge Destination: Home Respiratory Rate (breaths/min): 18 Transportation: Private Auto Blood Pressure (mmHg): 139/87 Accompanied By: self Schedule Follow-up Appointment:  Yes Clinical Summary of Care: Electronic Signature(s) Signed: 11/07/2019 1:31:05 PM By: Deon Pilling Entered By: Deon Pilling on 09/28/2019 11:26:16 -------------------------------------------------------------------------------- Lower Extremity Assessment Details Patient Name: Date of Service: JERMISHA, HOFFART 09/28/2019 10:30 A M Medical Record Number: [id number] Patient Account Number: [id number] Date of Birth/Sex: Treating RN: May 08, 1951 (69 y.o. [name] Primary Care Malcolm Quast: [name] Other Clinician: Referring Beaux Wedemeyer: Treating Daylin Gruszka/Extender: Doyle Askew, FRED Weeks in Treatment: 112 Edema Assessment Assessed: [Left: Yes] [Right: Yes] Edema: [Left: Yes] [Right: Yes] Calf Left: Right: Point of Measurement: 37 cm From Medial Instep 42 cm 38 cm Ankle  Left: Right: Point of Measurement: 10 cm From Medial Instep 32 cm 25 cm Electronic Signature(s) Signed: 11/07/2019 1:31:05 PM By: Deon Pilling Entered By: Deon Pilling on 09/28/2019 10:18:35 -------------------------------------------------------------------------------- [place] Details Patient Name: Date of Service: [name] 09/28/2019 10:30 A M Medical Record Number: [id number] Patient Account Number: [id number] Date of Birth/Sex: Treating RN: 06/09/1951 (69 y.o. [name] Primary Care Lenville Hibberd: [name] Other Clinician: Referring Tian Davison: Treating Adaya Garmany/Extender: Doyle Askew, FRED Weeks in Treatment: [phone] Active Inactive Venous Leg Ulcer Nursing Diagnoses: Actual venous Insuffiency (use after diagnosis is confirmed) Knowledge deficit related to disease process and management Goals: Patient will maintain optimal edema control Date Initiated: 08/12/2017 Target Resolution Date: 10/19/2019 Goal Status: Active Patient/caregiver will verbalize understanding of disease process and disease management Date Initiated: 08/12/2017 Date  Inactivated: 04/21/2018 Target Resolution Date: 04/24/2018 Goal Status: Met Interventions: Assess peripheral edema status every visit. Compression as ordered Treatment Activities: Therapeutic compression applied : 08/12/2017 Notes: Wound/Skin Impairment Nursing Diagnoses: Impaired tissue integrity Knowledge deficit related to ulceration/compromised skin integrity Goals: Patient/caregiver will verbalize understanding of skin care regimen Date Initiated: 08/12/2017 Target Resolution Date: 10/19/2019 Goal Status: Active Ulcer/skin breakdown will have a volume reduction of 30% by week 4 Date Initiated: 08/05/2017 Date Inactivated: 09/30/2017 Target Resolution Date: 10/03/2017 Goal Status: Met Ulcer/skin breakdown will have a volume reduction of 50% by week 8 Date Initiated: 09/30/2017 Date Inactivated: 10/28/2017 Target Resolution Date: 10/28/2017 Goal Status: Met Interventions: Assess patient/caregiver ability to perform ulcer/skin care regimen upon admission and as needed Assess ulceration(s) every visit Provide education on ulcer and skin care Screen for HBO Treatment Activities: Patient referred to home care : 08/05/2017 Skin care regimen initiated : 08/05/2017 Topical wound management initiated : 08/05/2017 Notes: Electronic Signature(s) Signed: 09/28/2019 5:43:24 PM By: [name] RN, BSN Entered By: [name] on 09/28/2019 10:52:12 -------------------------------------------------------------------------------- Pain Assessment Details Patient Name: Date of Service: [name] 09/28/2019 10:30 A M Medical Record Number: [id number] Patient Account Number: [id number] Date of Birth/Sex: Treating RN: 12-14-1950 (69 y.o. [name] Primary Care Madhav Mohon: [name] Other Clinician: Referring Katherine Syme: Treating Katieann Hungate/Extender: Doyle Askew, FRED Weeks in Treatment: [phone] Active Problems Location of Pain Severity and Description of Pain Patient  Has Paino Yes Site Locations Rate the pain. Current Pain Level: 7 Pain Management and Medication Current Pain Management: Electronic Signature(s) Signed: 09/28/2019 2:20:25 PM By: Sandre Kitty Signed: 09/28/2019 5:43:24 PM By: [name] RN, BSN Entered By: Sandre Kitty on 09/28/2019 09:59:46 -------------------------------------------------------------------------------- Patient/Caregiver Education Details Patient Name: Date of Service: [name] 4/14/2021andnbsp10:30 [place] Record Number: [id number] Patient Account Number: [id number] Date of Birth/Gender: Treating RN: 1951-04-27 (69 y.o. [name] Primary Care Physician: [name] Other Clinician: Referring Physician: Treating Physician/Extender: Doyle Askew, FRED Weeks in Treatment: [phone] Education Assessment Education Provided To: Patient Education Topics Provided Wound Debridement: Methods: Explain/Verbal Responses: Reinforcements needed, State content correctly Wound/Skin Impairment: Methods: Explain/Verbal Responses: Reinforcements needed, State content correctly Electronic Signature(s) Signed: 09/28/2019 5:43:24 PM By: [name] RN, BSN Entered By: [name] on 09/28/2019 10:52:40 -------------------------------------------------------------------------------- Wound Assessment Details Patient Name: Date of Service: [name] 09/28/2019 10:30 A M Medical Record Number: [id number] Patient Account Number: [id number] Date of Birth/Sex: Treating RN: Jun 04, 1951 (69 y.o. [name] Primary Care Corlene Sabia: [name] Other Clinician: Referring Eugenio Dollins: Treating Yuriko Portales/Extender: Doyle Askew, FRED Weeks in Treatment: 112 Wound Status Wound Number: 61 Primary Venous Leg Ulcer Etiology: Wound Location: Left, Circumferential  Lower Leg Wound Open Wounding Event: Gradually Appeared Status: Date Acquired: 04/20/2019 Comorbid Asthma,  Hypertension, Peripheral Arterial Disease, Peripheral Weeks Of Treatment: 23 History: Venous Disease, Type II Diabetes, Gout, Osteoarthritis Clustered Wound: No Photos Photo Uploaded By: Mikeal Hawthorne on 09/30/2019 09:15:22 Wound Measurements Length: (cm) 16 Width: (cm) 22.5 Depth: (cm) 0.1 Area: (cm) 282.743 Volume: (cm) 28.274 Wound Description Classification: Full Thickness Without Exposed Support Structu Wound Margin: Flat and Intact Exudate Amount: Large Exudate Type: Serous Exudate Color: amber Foul Odor After Cleansing: Slough/Fibrino % Reduction in Area: -3649.9% % Reduction in Volume: -3649.9% Epithelialization: Small (1-33%) Tunneling: No Undermining: No res No Yes Wound Bed Granulation Amount: Small (1-33%) Exposed Structure Granulation Quality: Pink, Pale Fascia Exposed: No Necrotic Amount: Large (67-100%) Fat Layer (Subcutaneous Tissue) Exposed: Yes Necrotic Quality: Adherent Slough Tendon Exposed: No Muscle Exposed: No Joint Exposed: No Bone Exposed: No Electronic Signature(s) Signed: 09/28/2019 5:43:24 PM By: [name] RN, BSN Signed: 11/07/2019 1:31:05 PM By: Deon Pilling Entered By: Deon Pilling on 09/28/2019 10:17:53 -------------------------------------------------------------------------------- Wound Assessment Details Patient Name: Date of Service: [name] 09/28/2019 10:30 A M Medical Record Number: [id number] Patient Account Number: [id number] Date of Birth/Sex: Treating RN: 1950/12/17 (69 y.o. [name] Primary Care Macklen Wilhoite: [name] Other Clinician: Referring Glenis Musolf: Treating Marieliz Strang/Extender: Doyle Askew, FRED Weeks in Treatment: 112 Wound Status Wound Number: 64 Primary Diabetic Wound/Ulcer of the Lower Extremity Etiology: Wound Location: Left, Dorsal Foot Wound Open Wounding Event: Gradually Appeared Status: Date Acquired: 06/01/2019 Comorbid Asthma, Hypertension, Peripheral  Arterial Disease, Peripheral Weeks Of Treatment: 17 History: Venous Disease, Type II Diabetes, Gout, Osteoarthritis Clustered Wound: No Photos Photo Uploaded By: Mikeal Hawthorne on 09/30/2019 09:14:57 Wound Measurements Length: (cm) 6.8 Width: (cm) 6.5 Depth: (cm) 0.1 Area: (cm) 34.715 Volume: (cm) 3.471 % Reduction in Area: -20939.4% % Reduction in Volume: -6983.7% Epithelialization: Small (1-33%) Tunneling: No Undermining: No Wound Description Classification: Grade 1 Wound Margin: Flat and Intact Exudate Amount: Small Exudate Type: Serous Exudate Color: amber Foul Odor After Cleansing: No Slough/Fibrino Yes Wound Bed Granulation Amount: Small (1-33%) Exposed Structure Granulation Quality: Pink, Pale Fascia Exposed: No Necrotic Amount: Large (67-100%) Fat Layer (Subcutaneous Tissue) Exposed: Yes Necrotic Quality: Adherent Slough Tendon Exposed: No Muscle Exposed: No Joint Exposed: No Bone Exposed: No Electronic Signature(s) Signed: 09/28/2019 5:43:24 PM By: [name] RN, BSN Signed: 11/07/2019 1:31:05 PM By: Deon Pilling Entered By: Deon Pilling on 09/28/2019 10:18:54 -------------------------------------------------------------------------------- Wound Assessment Details Patient Name: Date of Service: [name] 09/28/2019 10:30 A M Medical Record Number: [id number] Patient Account Number: [id number] Date of Birth/Sex: Treating RN: 09-05-1950 (69 y.o. [name] Primary Care Finnbar Cedillos: [name] Other Clinician: Referring Evelyna Folker: Treating Orrie Schubert/Extender: Doyle Askew, FRED Weeks in Treatment: 112 Wound Status Wound Number: 65 Primary Venous Leg Ulcer Etiology: Wound Location: Right, Medial Lower Leg Wound Open Wounding Event: Gradually Appeared Status: Date Acquired: 09/28/2019 Comorbid Asthma, Hypertension, Peripheral Arterial Disease, Peripheral Weeks Of Treatment: 0 History: Venous Disease, Type II Diabetes, Gout,  Osteoarthritis Clustered Wound: No Photos Photo Uploaded By: Mikeal Hawthorne on 09/30/2019 09:14:57 Wound Measurements Length: (cm) 0.7 Width: (cm) 2 Depth: (cm) 0.1 Area: (cm) 1.1 Volume: (cm) 0.11 % Reduction in Area: 0% % Reduction in Volume: 0% Epithelialization: Small (1-33%) Tunneling: No Undermining: No Wound Description Classification: Full Thickness Without Exposed Support Structures Wound Margin: Distinct, outline attached Exudate Amount: Medium Exudate Type: Serosanguineous Exudate Color: red, brown Foul Odor After Cleansing: No Slough/Fibrino Yes Wound Bed Granulation Amount: Small (1-33%)  Exposed Structure Granulation Quality: Red, Pink Fascia Exposed: No Necrotic Amount: Large (67-100%) Fat Layer (Subcutaneous Tissue) Exposed: Yes Necrotic Quality: Adherent Slough Tendon Exposed: No Muscle Exposed: No Joint Exposed: No Bone Exposed: No Electronic Signature(s) Signed: 09/28/2019 5:43:24 PM By: [name] RN, BSN Signed: 11/07/2019 1:31:05 PM By: Deon Pilling Entered By: Deon Pilling on 09/28/2019 10:19:27 -------------------------------------------------------------------------------- [place] Details Patient Name: Date of Service: [name] 09/28/2019 10:30 A M Medical Record Number: [id number] Patient Account Number: [id number] Date of Birth/Sex: Treating RN: November 16, 1950 (69 y.o. [name] Primary Care Aedyn Mckeon: [name] Other Clinician: Referring Kayne Yuhas: Treating Linsey Arteaga/Extender: Doyle Askew, FRED Weeks in Treatment: 112 Vital Signs Time Taken: 09:59 Temperature (F): 98.8 Height (in): 62 Pulse (bpm): 113 Weight (lbs): 335 Respiratory Rate (breaths/min): 18 Body Mass Index (BMI): 61.3 Blood Pressure (mmHg): 139/87 Reference Range: 80 - 120 mg / dl Electronic Signature(s) Signed: 09/28/2019 2:20:25 PM By: Sandre Kitty Entered By: Sandre Kitty on 09/28/2019 09:59:35

## 2019-11-09 ENCOUNTER — Encounter (HOSPITAL_BASED_OUTPATIENT_CLINIC_OR_DEPARTMENT_OTHER): Payer: Medicare PPO | Admitting: Physician Assistant

## 2019-11-09 DIAGNOSIS — E11621 Type 2 diabetes mellitus with foot ulcer: Secondary | ICD-10-CM | POA: Diagnosis not present

## 2019-11-09 NOTE — Progress Notes (Addendum)
[name] ([id number]) Visit Report for 11/09/2019 Chief Complaint Document Details Patient Name: Date of Service: [name] 11/09/2019 10:00 A M Medical Record Number: [id number] Patient Account Number: [id number] Date of Birth/Sex: Treating RN: 12-18-1950 (69 y.o. [name] Primary Care Provider: [name] Other Clinician: Referring Provider: Treating Provider/Extender: Doyle Askew, FRED Weeks in Treatment: [phone] Information Obtained from: Patient Chief Complaint Bilateral reoccurring LE ulcers Electronic Signature(s) Signed: 11/09/2019 10:45:54 AM By: [name] PA-C Entered By: [name] on 11/09/2019 10:45:53 -------------------------------------------------------------------------------- HPI Details Patient Name: Date of Service: [name]. 11/09/2019 10:00 A M Medical Record Number: [id number] Patient Account Number: [id number] Date of Birth/Sex: Treating RN: 12-Feb-1951 (69 y.o. [name] Primary Care Provider: [name] Other Clinician: Referring Provider: Treating Provider/Extender: Doyle Askew, FRED Weeks in Treatment: 118 History of Present Illness HPI Description: this patient has been seen a couple of times before and returns with recurrent problems to her right and left lower extremity with swelling and weeping ulcerations due to not wearing her compression stockings which she had been advised to do during her last discharge, at the end of June 2018. During her last visit the patient had had normal arterial blood flow and her venous reflux study did not necessitate any surgical intervention. She was recommended compression and elevation and wound care. After prolonged treatment the patient was completely healed but she has been noncompliant with wearing or compressions.. She was here last week with an outpatient return visit planned but the patient came in a very poor general condition with altered  mental status and was rushed to the ER on my request. With a history of hypertension, diabetes, TIA and right-sided weakness she was set up for an MRI on her brain and cervical spine and was sent to [facility]. Getting an MRI done was very difficult but once the workup was done she was found not to have any spinal stenosis, epidural abscess or hematoma or discitis. This was radiculopathy to be treated as an outpatient and she was given a follow-up appointment. Today she is feeling much better alert and oriented and has come to reevaluate her bilateral lower extremity lymphedema and ulceration 03/25/2017 -- she was admitted to the hospital on 03/16/2017 and discharged on 03/18/2017 with left leg cellulitis and ulceration. She was started on vancomycin and Zosyn and x-ray showed no bony involvement. She was treated for a cellulitis with IV antibiotics changed to Rocephin and Flagyl and was discharged on oral Keflex and doxycycline to complete a 7 day course. Last hemoglobin A1c was 7.1 and her other ailments including hypertension got asthma were appropriately treated. 05/06/2017 -- she is awaiting the right size of compression stockings from [place] but other than that has been doing well. ====== Old notes 69 year old patient was seen one time last October and was lost to follow-up. She has recurrent problems with weeping and ulceration of her left lower extremity and has swelling of this for several years. It has been worse for the last 2 months. Past medical history is significant for diabetes mellitus type 2, hypertension, gout, morbid obesity, depressive disorders, hiatal hernia, migraines, status post knee surgery, risk of a cholecystectomy, vaginal hysterectomy and breast biopsy. She is not a smoker. As noted before she has never had a venous duplex study and an arterial ABI study was attempted but the left lower extremity was noncompressible 10/01/2016 -- had a lower extremity venous duplex  reflux evaluation which showed  no evidence of deep vein reflux in the right or left lower extremity, and no evidence of great saphenous vein reflux more than 500 ms in the right or left lower extremity, and the left small saphenous vein is incompetent but no vascular consult was recommended. review of her electronic medical records noted that the ABI was checked in July 2017 where the right ABI was normal limits and the left ABI could not be ascertained due to pain with cuff pressure but the waveforms are within normal limits. her arterial duplex study scheduled for April 27. 10/08/2016 -- the patient has various reasons for not having a compression on and for the last 3 days she has had no compression on her left lower extremity either due to pain or the lack of nursing help. She does not use her juxta lites either. 10/15/2016 -- the patient did not keep her appointment for arterial duplex study on April 27 and I have asked her to reschedule this. Her pain is out of proportion with the physical findings and she continuously fails to wear a compression wraps and cuts them off because she says she cannot tolerate the pain. She does not use her juxta lites either. 10/22/2016 -- he has rescheduled her arterial duplex study to May 21 and her pain today is a bit better. She has not been wearing her juxta lites on her right lower extremity but now understands that she needs to do this. She did tolerate the to press compression wrap on her left lower extremity 10/29/2016 --arterial duplex study is scheduled for next week and overall she has been tolerating her compression wraps and also using her juxta lites on her right lower extremity 11/05/2016 -- the right ABI was 0.95 the left was 1.03. The digit TBI is on the right was 0.83 on the left was 0.92 and she had biphasic flow through these vessels. The impression was that of normal lower extremity arterial study. 11/12/2016 -- her pain is minimal and she  is doing very well overall. 11/26/2016 -- she has got juxta lites and her insurance will not pay for additional dual layer compression stockings. She is going to order some from [facility]. 05/12/2017 -- her juxta lites are very old and too big for her and these have not been helping with compression. She did get 20-30 mm compression stockings from  but she and her husband are unable to put these on. I believe she will benefit from bilateral Extremit-ease, compression stockings and we will measure her for these today. 05/20/2017 -- lymphedema on the left lower extremity has increased a lot and she has a open ulceration as a result of this. The right lower extremity is looking pretty good. She has decided to by the compression stockings herself and will get reimbursed by the home health, at a later date. 05/27/2017 -- her sciatica is bothering her a lot and she thought her left leg pain was caused due to the compression wrap and hence removed it and has significant lymphedema. There is no inflammation on this left lower extremity. 06/17/17 on evaluation today patient appears to be doing very well and in fact is completely healed in regard to her ulcerations. Unfortunately however she does have continued issues with lymphedema nonetheless. We did order compression garments for her unfortunately she states that the size that she received were large although we ordered medium. Obviously this means she is not getting the optimal compression. She does not have those with her today and therefore  we could not confirm and contact the company on her behalf. Nonetheless she does state that she is going to have her husband bring them by tomorrow so that we can verify and then get in touch with the company. No fevers, chills, nausea, or vomiting noted at this time. Overall patient is doing better otherwise and I'm pleased with the progress she has made. 07/01/17 on evaluation today patient appears to be doing  very well in regard to her bilateral lower extremity she does not have any openings at this point which is excellent news. Overall I'm pleased with how things have progressed up to this time. Since she is doing so well we did order her compression which we are seeing her today to ensure that it fits her properly and everything is doing well in that regard and then subsequently she will be discharged. ============ Old Notes: 03/31/16 patient presents today for evaluation concerning open wounds that she has over the left medial ankle region as well as the left dorsal foot. She has previously had this occur although it has been healed for a number of months after having this for about a year prior until her hospitalization on 01/05/16. At that point in time it appears that she was admitted to the hospital for left lower extremity cellulitis and was placed on vancomycin and Zosyn at that point. Eventually upon discharge on January 15, 2016 she was placed on doxycycline at that point in time. Later on 03/27/16 positive wound culture growing Escherichia coli this was switched to amoxicillin. Currently she tells me that she is having pain radiated to be a 7 out of 10 which can be as high as 10 out of 10 with palpation and manipulation of the wound. This wound appears to be mainly venous in nature due to the bilateral lower extremity venous stasis/lymphedema. This is definitely much worse on her left than the right side. She does have type 1 diabetes mellitus, hypertension, morbid obesity, and is wheelchair dependent.during the course of the hospital stay a blood culture was also obtained and fortunately appeared negative. She also had an x-ray of the tibia/fibula on the left which showed no acute bone abnormality. Her white blood cell count which was performed last on 03/25/16 was 7.3, hemoglobin 12.8, protein 7.1, albumin 3.0. Her urine culture appeared to be negative for any specific organisms. Patient did  have a left lower extremity venous duplex evaluation for DVT . This did not include venous reflux studies but fortunately was negative for DVT Patient also had arterial studies performed which revealed that she had a . normal ABI on the right though this was unable to be performed on the left secondary to pain that she was having around the ankle region due to the wound. However it was stated on report that she had biphasic pulses and apparently good blood flow. ========== 06/03/17 she is here in follow-up evaluation for right lower extremity ulcer. The right lower sure he has healed but she has reopened to the left medial malleolus and dorsal foot with weeping. She is waiting for new compression garments to arrive from home health, the previous compression garments were ill fitting. We will continue with compression bilaterally and follow-up in 2 weeks Readmission: 08/05/17 on evaluation today patient appears to be doing somewhat poorly in regard to her left lower extremity especially although the right lower extremity has a small area which may no longer be open. She has been having a lot of drainage from the  left lower extremity however he tells me that she has not been able to use the EXTREMIT-EASE Compression at this point. She states that she did better and was able to actually apply the Juxta-Lite compression although the wound that she has is too large and therefore really does not compress which is why she cannot wear it at this point. She has no one who can help her put it on regular basis her son can sometimes but he's not able to do it most of the time. I do believe that's why she has begun to weave and have issues as she is currently yet again. No fevers, chills, nausea, or vomiting noted at this time. Patient is no evidence of dementia. 08/12/17 on evaluation today patient appears to still be doing fairly well in regard to the draining areas/weeping areas at this point. With that being  said she unfortunately did go to the ER yesterday due to what was felt to be possibly a cellulitis. They place her on doxycycline by mouth and discharge her home. She definitely was not admitted. With that being said she states she has had more discomfort which has been unusual for her even compared to prior times and she's had infections.08/12/17 on evaluation today patient appears to still be doing fairly well in regard to the draining areas/weeping areas at this point. With that being said she unfortunately did go to the ER yesterday due to what was felt to be possibly a cellulitis. They place her on doxycycline by mouth and discharge her home. She definitely was not admitted. With that being said she states she has had more discomfort which has been unusual for her even compared to prior times and she's had infections. 08/19/17 put evaluation today patient tells me that she's been having a lot of what sounds to be neuropathic type pain in regard to her left lower extremity. She has been using over-the-counter topical bins again which some believe. That in order to apply the she actually remove the wrap we put on her last Wednesday on Thursday. Subsequently she has not had anything on compression wise since that time. The good news is a lot of the weeping areas appear to have closed at this point again I believe she would do better with compression but we are struggling to get her to actually use what she needs to at this point. No fevers, chills, nausea, or vomiting noted at this time. 09/03/17 on evaluation today patient appears to be doing okay in regard to her lower extremities in regard to the lymphedema and weeping. Fortunately she does not seem to show any signs of infection at this point she does have a little bit of weeping occurring in the right medial malleolus area. With that being said this does not appear to be too significant which is good news. 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 She has severe skin damage secondary to both of these features involving the dorsal left foot and medial left ankle and lower leg. Still has open areas in the left anterior foot. The area on the right closed over. She uses her own juxta light stockings. She does not have an arterial issue 09/16/17 on evaluation today patient actually appears to be doing excellent in regard to her bilateral lower extremity swelling. The Juxta-Lite compression wrap seem to be doing very well for her. She has not however been using the portion that goes over her foot.  Her left foot still is draining a little bit not nearly as significant as it has been in the past but still I do believe that she likely needs to utilize the full wrap including the foot portion of this will improve as well. She also has been apparently putting on a significant amount of Vaseline which also think is not helpful for her. I recommended that if she feels she needs something for moisturizer Eucerin will probably be better. 09/30/17 on evaluation today patient presents with several new open areas in regard to her left lower extremity although these appear to be minimal and mainly seem to be more moisture breakdown than anything. Fortunately she does not seem to have any evidence of infection which is great news. She has been tolerating the dressing changes without complication we are using silver alginate on the foot she has been using AB pads to have the legs and using her Juxta- Lite compression which seems to be controlling her swelling very well. Overall I'm pleased with the poor way she has progressed. 10/14/17 on evaluation today patient appears to be doing better in regard to her left lower extremity areas of weeping. She does still have some discomfort although in general this does not appear to be as macerated and I think it is progressing nicely. I do think she still  needs to wear the foot portion of her Juxta- Lite in order to get the most benefit from the wrap obviously. She states she understands. Fortunately there does not appear to be evidence of infection at this time which is great news. 10/28/17 on evaluation today patient appears to be doing excellent in regard to her left lower extremity. She has just a couple areas that are still open and seem to be causing any trouble whatsoever. For that reason I think that she is definitely headed in the right direction the spots are very tiny compared to what we have been dealing with in the past. 11/11/17 on evaluation today patient appears to have a right lateral lower extremity ulcer that has opened since I last saw her. She states this is where the home health nurse that was coming out remove the dressing without wetting the alginate first. Nonetheless I do not know if this is indeed the case or not but more importantly we have not ordered home help to be coming out for her wounds at all. I'm unsure as to why they are coming out and we're gonna have to check on this and get things situated in that regard. With that being said we currently really do not need them to be coming out as the patient has been taking care of her leg herself without complication and no issues. In fact she was doing much better prior to nursing coming out. 11/25/17 on evaluation today patient actually appears to be doing fairly well in regard to her left lower extremity swelling. In fact she has very little area of weeping at this point there's just a small spot on the lateral portion of her right leg that still has me just a little bit more concerned as far as wanting to see this clear up before I discharge her to caring for this at home. Nonetheless overall she has made excellent progress. 12/09/17 on evaluation today patient appears to be doing rather well in regard to her lower extremity edema. She does have some weeping still in the left  lower extremity although the big area we were taking care of two  weeks ago actually has closed and she has another area of weeping on the left lower extremity immediately as well is the top of her foot. She does not currently have lymphedema pumps she has been wearing her compression daily on a regular basis as directed. With that being said I think she may benefit from lymphedema pumps. 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 We had a very difficult time getting and keeping this under control. 12/23/17 on evaluation today patient actually appears to be doing a little bit more poorly in regard to her bilateral lower extremities. She has been tolerating the Juxta-Lite compression wraps. Unfortunately she has two new ulcers on the right lower extremity and left lower Trinity ulceration seems to be larger. Obviously this is not good news. She has been tolerating the dressings without complication. 12/30/17 on evaluation today patient actually appears to be doing much better in regard to her bilateral lower extremity edema. She continues to have some issues with ulcerations and in fact there appears to be one spot on each leg where the wrap may have caused a little bit of a blister which is subsequently opened up at this point is given her pain. Fortunately it does not appear to be any evidence of infection which is good news. No fevers chills noted. 01/13/18 on evaluation today patient appears to be doing rather well in regard to her bilateral lower extremities. The dressings did get kind of stuck as far as the wound beds are concerned but again I think this is mainly due to the fact that she actually seems to be showing signs of healing which is good news. She's not having as much drainage therefore she was having more of the dressing sticking.  Nonetheless overall I feel like her swelling is dramatically down compared to previous. 01/20/18 on evaluation today patient unfortunately though she's doing better in most regards has a large blister on the left anterior lower extremity where she is draining quite significantly. Subsequently this is going to need debridement today in order to see what's underneath and ensure she does not continue to trapping fluid at this location. Nonetheless No fevers, chills, nausea, or vomiting noted at this time. 01/27/18 on evaluation today patient appears to be doing rather well at this point in regard to her right lower extremity there's just a very small area that she still has open at this point. With that being said I do believe that she is tolerating the compression wraps very well in making good progress. Home health is coming out at this point to see her. Her left lower extremity on the lateral portion is actually what still mainly open and causing her some discomfort for the most part 02/10/18 on evaluation today patient actually appears to be doing very well in regard to her right lower extremity were all the ulcers appear to be completely close. In regard to the left lower extremity she does have two areas still open and some leaking from the dorsal surface of her foot but this still seems to be doing much better to me in general. 02/24/18 on evaluation today patient actually appears to be doing much better in regard to her right lower extremity this is still completely healed. Her left lower extremity is also doing much better fortunately she has no evidence of infection. The one  area that is gonna require some debridement is still on the left anterior shin. Fortunately this is not hurting her as badly today. 03/10/18 on evaluation today patient appears to be doing better in some regards although she has a little bit more open area on the dorsal foot and she also has some issues on the medial portion of  the left lower extremity which is actually new and somewhat deep. With that being said there fortunately does not appear to be any significant signs of infection which is good news. No fevers, chills, nausea, or vomiting noted at this time. In general her swelling seems to be doing fairly well which is good news. 03/31/18 on evaluation today patient presents for follow-up concerning her left lower extremity lymphedema. Unfortunately she has been doing a little bit more poorly since I last saw her in regard to the amount of weeping that she is experiencing. She's also having some increased pain in the anterior shin location. Unfortunately I do not feel like the patient is making such good progress at this point a few weeks back she was definitely doing much better. 04/07/18 on evaluation today patient actually appears to be showing some signs of improvement as far as the left lower extremity is concerned. She has been tolerating the dressing changes and it does appear that the Drawtex did better for her. With that being said unfortunately home health is stating that they cannot obtain the Drawtex going forward. Nonetheless we're gonna have to check and see what they may be able to get the alginate they were using was getting stuck in causing new areas of skin being pulled all that with and subsequently weep and calls her to worsen overall this is the first time we've seen improvement at this time. 04/14/18 on evaluation today patient actually appears to be doing rather well at this point there does not appear to be any evidence of infection at this time and she is actually doing excellent in regard to the weeping in fact she almost has no openings remaining even compared to just last week this is a dramatic improvement. No fevers chills noted 04/21/18 evaluation today patient actually appears to be doing very well. She in fact is has a small area on the posterior lower extremity location and she has  a small area on the dorsal surface of her foot that are still open both of which are very close to closing. We're hoping this will be close shortly. She brought her Juxta-Lite wrap with her today hoping that would be able to put her in it unfortunately I don't think were quite at that point yet but we're getting closer. 04/28/18 upon evaluation today patient actually appears to be doing excellent in regard to her left lower extremity ulcer. In fact the region on the posterior lower extremity actually is much smaller than previously noted. Overall I'm very happy with the progress she has made. She again did bring her Juxta-Lite although we're not quite ready for that yet. 05/11/18 upon evaluation today patient actually appears to be doing in general fairly well in regard to her left lower Trinity. The swelling is very well controlled. With that being said she has a new area on the left anterior lower extremity as well as between the first and second toes of her left foot that was not present during the last evaluation. The region of her posterior left lower extremity actually appears to be almost completely healed. T be honest I'm very pleased  with o the way that stands. Nonetheless I do believe that the lotion may be keeping the area to moist as far as her legs are concerned subsequently I'm gonna consider discontinuing that today. 05/26/18 on evaluation today patient appears to be doing rather well in regard to her left lower should be ulcers. In fact everything appears to be close except for a very small area on the left posterior lower extremity. Fortunately there does not appear to be any evidence of infection at this time. Overall very pleased with her progress. 06/02/18 and evaluation today patient actually appears to be doing very well in regard to her lower extremity ulcers. She has one small area that still continues to weep that I think may benefit her being able to justify lotion and user  Juxta-Lite wraps versus continued to wrap her. Nonetheless I think this is something we can definitely look into at this point. 06/23/18 on evaluation today patient unfortunately has openings of her bilateral lower extremities. In general she seems to be doing much worse than when I last saw her just as far as her overall health standpoint is concerned. She states that her discomfort is mainly due to neuropathy she's not having any other issues otherwise. No fevers, chills, nausea, or vomiting noted at this time. 06/30/18 on evaluation today patient actually appears to be doing a little worse in regard to her right lower extremity her left lower extremity of doing fairly well. Fortunately there is no sign of infection at this time. She has been tolerating the dressing changes without complication. Home health did not come out like they were supposed to for the appropriate wrap changes. They stated that they never received the orders from [place] which were fax. Nonetheless we will send a copy of the orders with the patient today as well. 07/07/18 on evaluation today patient appears to be doing much better in regard to lower extremities. She still has several openings bilaterally although since I last saw her her legs did show obvious signs of infection when she later saw her nurse. Subsequently a culture was obtained and she is been placed on Bactrim and Keflex. Fortunately things seem to be looking much better it does appear she likely had an infection. Again last week we'd even discussed it but again there really was not any obvious sign that she had infection therefore we held off on the antibiotics. Nonetheless I'm glad she's doing better today. 07/14/18 on evaluation today patient appears to be doing much better regarding her bilateral lower Trinity's. In fact on the right lower for me there's nothing open at this point there are some dry skin areas at the sites where she had infection. Fortunately there is  no evidence of systemic infection which is excellent news. No fevers chills noted 07/21/18 on evaluation today patient actually appears to be doing much better in regard to her left lower extremity ulcers. She is making good progress and overall I feel like she's improving each time I see her. She's having no pain I do feel like the infection is completely resolved which is excellent news. No fevers, chills, nausea, or vomiting noted at this time. 07/28/18 on evaluation today patient appears to be doing very well in regard to her left lower [employer]. Everything seems to be showing signs of improvement which is excellent news. Overall very pleased with the progress that has been made. Fortunately there's no evidence of active infection at this time also excellent news. 08/04/18 on evaluation  today patient appears to be doing more poorly in regard to her bilateral lower extremities. She has two new areas open up on the right and these were completely closed as of last week. She still has the two spots on the left which in my pinion seem to be doing better. Fortunately there's no evidence of infection again at this point. 08/11/18 on evaluation today patient actually appears to be doing very well in regard to her bilateral lower Trinity wounds that all seem to be doing better and are measures smaller today. Fortunately there's no signs of infection. No fevers, chills, nausea, or vomiting noted at this time. 08/18/18 on evaluation today patient actually appears to be doing about the same inverter bilateral lower extremities. She continues to have areas that blistering open as was drain that fortunately nothing too significant. Overall I feel like Drawtex may have done better for her however compared to the collagen. 08/25/18 on evaluation today patient appears to be doing a little bit more poorly today even compared to last time I saw her. Again I'm not exactly sure why she's making worse progress over  the past several weeks. I'm beginning to wonder if there is some kind of underlying low level infection causing this issue. I did actually take a culture from the left anterior lower extremity but it was a new wound draining quite a bit at this point. Unfortunately she also seems to be having more pain which is what also makes me worried about the possibility of infection. This is despite never erythema noted at this point. 09/01/18 on evaluation today patient actually appears to be doing a little worse even compared to last week in regard to bilateral lower extremities. She did go to the hospital on the 16th was given a dose of IV Zosyn and then discharged with a recommendation to continue with the Bactrim that I previously prescribed for her. Nonetheless she is still having a lot of discomfort she tells me as well at this time. This is definitely unfortunate. No fevers, chills, nausea, or vomiting noted at this time. 09/08/18 on evaluation today patient's bilateral lower extremities actually appear to be shown signs of improvement which is good news. Fortunately there does not appear to be any signs of active infection I think the anabiotic is helping in this regard. Overall I'm very pleased with how she is progressing. 09/15/18 patient was actually seen in ER yesterday due to her legs as well unfortunately. She states she's been having a lot of pain and discomfort as well as a lot of drainage. Upon inspection today the patient does have a lot of swelling and drainage I feel like this is more related to lymphedema and poor fluid control than it is to infection based on what I'm seeing. The physician in the emergency department also doubted that the patient was having a significant infection nonetheless I see no evidence of infection obvious at this point although I do see evidence of poor fluid control. She still not using a compression pumps, she is not elevating due to her lift chair as well as her  hospital bed being broken, and she really is not keeping her legs up as much as they should be and also has been taking off her wraps. All this combined I think has led to poor fluid control and to be honest she may be somewhat volume overloaded in general as well. I recommend that she may need to contact your physician to see if a  prescription for a diuretic would be beneficial in their opinion. As long as this is safe I think it would likely help her. 09/29/18 on evaluation today patient's left lower extremity actually appears to be doing quite a bit better. At least compared to last time that I saw her. She still has a large area where she is draining from but there's a lot of new skin speckled trout and in fact there's more new skin that there are open areas of weeping and drainage at this point. This is good news. With regard to the right lower extremity this is doing much better with the only open area that I really see being a dry spot on the right lateral ankle currently. Fortunately there's no signs of active infection at this time which is good news. No fevers, chills, nausea, or vomiting noted at this time. The patient seems somewhat stressed and overwhelmed during the visit today she was very lethargic as such. She does and she is not taking any pain medications at this point. Apparently according to her husband are also in the process of moving which is probably taking its toll on her as well. 10/06/18 on evaluation today patient appears to be doing rather well in regard to her lower extremities compared to last evaluation. Fortunately there's no signs of active infection. She tells me she did have an appointment with her primary. Nonetheless he was concerned that the wounds were somewhat deep based on pictures but we never actually saw her legs. She states that he had her somewhat worried due to the fact that she was fearing now that she was [place] have to have an amputation. With that being  said based on what I'm seeing check she looks better this week that she has the last two times I've seen her with much less drainage I'm actually pleased in this regard. That doesn't mean that she's out of the water but again I do not think what the point of talking about education at all in regard to her leg. She is very happy to hear this. She is also not having as much pain as she was having last week. 10/13/18 unfortunately on evaluation today patient still continues to have a significant amount of drainage she's not letting home health actually apply the compression dressings at this point. She's trying to use of Juxta-Lite of the top of Kerlex and the second layer of the three layer compression wrap. With that being said she just does not seem to be making as good a progress as I would expect if she was having the compression applied and in place on a regular basis. No fevers, chills, nausea, or vomiting noted at this time. 10/20/18 on evaluation today patient appears to be doing a little better in regard to her bilateral lower extremity ulcers. In fact the right lower extremity seems to be healed she doesn't even have any openings at this point left lower extremity though still somewhat macerated seems to be showing signs of new skin growth at multiple locations throughout. Fortunately there's no evidence of active infection at this time. No fevers, chills, nausea, or vomiting noted at this time. 10/27/18 on evaluation today patient appears to be doing much better in regard to her left lower Trinity ulcer. She's been tolerating the laptop complication and has minimal drainage noted at this point. Fortunately there's no signs of active infection at this time. No fevers, chills, nausea, or vomiting noted at this time. 11/03/18 on evaluation today  patient actually appears to be doing excellent in regard to her left lower extremity. She is having very little drainage at this point there does not appear  to be any significant signs of infection overall very pleased with how things have gone. She is likewise extremely pleased still and seems to be making wonderful progress week to week. I do believe antibiotics were helpful for her. Her primary care provider did place on amateur clean since I last saw her. 11/17/18 on evaluation today patient appears to be doing worse in regard to her bilateral lower extremities at this point. She is been tolerating the dressing changes without complication. With that being said she typically takes the Coban off fairly quickly upon arriving home even after being seen here in the clinic and does not allow home health reapply command as part of the dressing at home. Therefore she said no compression essentially since I last saw her as best I can tell. With that being said I think it shows and how much swelling she has in the open wounds that are noted at this point. Fortunately there's no signs of infection but unfortunately if she doesn't get this under control I think she will end up with infection and more significant issues. 11/24/18 on evaluation today patient actually appears to be doing somewhat better in regard to her bilateral lower extremities. 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 Nonetheless that is doing better currently so she should be able to attempt reinitiating the lymphedema pumps at this time. No fevers, chills, nausea, or vomiting noted at this time. 12/01/18 upon evaluation today patient's left lower extremity appears to be doing quite well unfortunately her right lower extremity is not doing nearly as well. She has been tolerating the dressing changes without complication unfortunately she did not keep a wrap on the right at this time. Nonetheless I believe this has led to increased swelling and weeping in  the world is actually much larger than during the last evaluation with her. 12/08/18 on evaluation today patient appears to be doing about the same at this point in regard to her right lower extremity. There is some more palatable to touch I'm concerned about the possibility of there being some infection although I think the main issue is she's not keeping her compression wrap on which in turn is not allowing this area to heal appropriately. 12/22/18 on evaluation today patient appears to be doing better in regard to left lower extremity unfortunately significantly worse in regard to the right lower extremity. The areas of blistering and necrotic superficial tissue have spread and again this does not really appear to be signs of infection and all she just doesn't seem to be doing nearly as well is what she has been in the past. Overall I feel like the Augmentin did absolutely nothing for her she doesn't seem to have any infection again I really didn't think so last time either is more of a potential preventative measure and hoping that this would make some difference but I think the main issue is she's not wearing her compression. She tells me she cannot wear the [place] been we put on she takes it off pretty much upon getting home. Subsequently she worshiped Juxta-Lite when I questioned her about how often she wears it this is no  more than three hours a day obviously that leaves 21 hours that she has no compression and this is obviously not doing well for her. Overall I'm concerned that if things continue to worsen she is at great risk of both infection as well as losing her leg. 01/05/19 on evaluation today patient appears to be doing well in regard to her left lower extremity which he is allowing [place] to wrap and not so well with regard to her right lower extremity which she is not allowing [place] to really wrap and keep the wrap on. She states that it hurts too badly whenever it's wrapped and she ends up  having to take it off. She's been using the Juxta-Lite she tells me up to six hours a day although I question whether or not that's really been the case to be honest. Previously she told me three hours today nonetheless obviously the legs as long as the wrap is doing great when she is not is doing much more poorly. 01/12/2019 on evaluation today patient actually appears to be doing a little better in my opinion with regard to her right lower extremity ulcer. She has a small open area on the left lower extremity unfortunately but again this I think is part of the normal fluctuation of what she is going to have to expect with regard to her legs especially when she is not using her lymphedema pumps on a regular basis. Subsequently based on what I am seeing today I think that she does seem to be doing slightly better with regard to her right lower extremity she did see her primary care provider on Monday they felt she had an infection and placed her on 2 antibiotics. Both Cipro and clindamycin. Subsequently again she seems possibly to be doing a little bit better in regards to the right lower extremity she also tells me however she has been wearing the compression wrap over the past week since I spoke with her as well that is a Kerlix and Coban wrap on the right. No fevers, chills, nausea, vomiting, or diarrhea. 01/19/2019 on evaluation today patient appears to be doing better with regard to her bilateral lower extremities especially the right. I feel like the compression has been beneficial for her which is great news. She did get a call from her primary care provider on her way here today telling her that she did have methicillin- resistant Staphylococcus aureus and he was calling in a couple new antibiotics for her including a ointment to be applied she tells me 3 times a day. With that being said this sounds like likely to be Bactroban which I think could be applied with each dressing/wrap change but I  would not be able to accommodate her applying this 3 times a day. She is in agreement with the least doing this we will add that to her orders today. 01/26/2019 on evaluation today patient actually appears to be doing much better with regard to her right lower extremity. Her left lower extremity is also doing quite well all things considering. Fortunately there is no evidence of active infection at this time. No fevers, chills, nausea, vomiting, or diarrhea. 02/02/2019 on evaluation today patient appears to be doing much better compared to her last evaluation. Little by little off like her right leg is returning more towards normal. There does not appear to be any signs of active infection and overall she seems to be doing quite well which is great news. I am very pleased  in this regard. No fevers, chills, nausea, vomiting, or diarrhea. 02/09/2019 upon evaluation today patient appears to be doing better with regard to her bilateral lower extremities. She has been tolerating the dressing changes without complication. Fortunately there is no signs of active infection at this time. No fevers, chills, nausea, vomiting, or diarrhea. 02/23/2019 on evaluation today patient actually appears to be doing quite well with regard to her bilateral lower extremities. She has been tolerating the dressing changes without complication. She is even used her pumps one time and states that she really felt like it felt good. With that being said she seems to be in good spirits and her legs appear to be doing excellent. 03/09/2019 on evaluation today patient appears to be doing well with regard to her right lower extremity there are no open wounds at this time she is having some discomfort but I feel like this is more neuropathy than anything. With regard to her left lower extremity she had several areas scattered around that she does have some weeping and drainage from but again overall she does not appear to be having any  significant issues and no evidence of infection at this time which is good news. 03/23/2019 on evaluation today patient appears to be doing well with regard to her right lower extremity which she tells me is still close she is using her juxta light here. Her left lower extremity she mainly just has an area on the foot which is still slightly draining although this also is doing great. Overall very pleased at this time. 04/06/2019 patient appears to be doing a little bit worse in regard to her left lower extremity upon evaluation today. She feels like this could be becoming infected again which she had issues with previous. Fortunately there is no signs of systemic infection but again this is always a struggle with her with her legs she will go from doing well to not so well in a very short amount of time. 04/20/2019 on evaluation today patient actually appears to be doing quite well with regard to her right lower extremity I do not see any signs of active infection at this time. Fortunately there is no fever chills noted. She is still taking the antibiotics which I prescribed for her at this point. In regard to the left lower extremity I do feel like some of these areas are better although again she still is having weeping from several locations at this time. 04/27/2019 on evaluation today patient appears to be doing about the same if not slightly worse in regard to her left lower extremity ulcers. She tells me when questioned that she has been sleeping in her Hoveround chair in fact she tells me she falls asleep without even knowing it. I think she is spending a whole lot of time in the chair and less time walking and moving around which is not good for her legs either. On top of that she is in a seated position which is also the worst position she is not really elevating her legs and she is also not using her lymphedema pumps. All this is good to contribute to worsening of her condition in  general. 05/18/2019 on evaluation today patient appears to be doing well with regard to her lower extremity on the right in fact this is showing no signs of any open wounds at this time. On the left she is continuing to have issues with areas that do drain. Some of the regions have healed and  there are couple areas that have reopened. She did go to the ER per the patient according to recommendations from the home health nurse due to what she was seen when she came out on 05/13/2019. Subsequently she felt like the patient needed to go to the hospital due to the fact that again she was having "milky white discharge" from her leg. Nonetheless she had and then was placed on doxycycline and subsequently seems to be doing better. 06/01/2019 upon evaluation today patient appears to be doing really in my opinion about the same. I do not see any signs of active infection which is good news. Overall she still has wounds over the bilateral lower extremities she has reopened on the right but this appears to be more of a crack where there is weeping/edema coming from the region. I do not see any evidence of infection at either site based on what I visualized today. 07/13/2019 upon evaluation today patient appears to be doing a little worse compared to last time I saw her. She since has been in the hospital from 06/21/2019 through 06/29/2019. This was secondary to having Covid. During that time they did apply lotion to her legs which unfortunately has caused her to develop a myriad of open wounds on her lower extremities. Her legs do appear to be doing better as far as the overall appearance is concerned but nonetheless she does have more open and weeping areas. 07/27/2019 upon evaluation today patient appears to be doing more poorly to be honest in regard to her left lower extremity in particular. There is no signs of systemic infection although I do believe she may have local infection. She notes she has been having  a lot of blue/green drainage which is consistent potentially with Pseudomonas. That may be something that we need to consider here as well. The doxycycline does not seem to have been helping. 08/03/2019 upon evaluation today patient appears to be doing a little better in my opinion compared to last week's evaluation. Her culture I did review today and she is on appropriate medications to help treat the Enterobacter that was noted. Overall I feel like that is good news. With that being said she is unfortunately continuing to have a lot of drainage and though it is doing better I still think she has a long ways to go to get things dried up in general. Fortunately there is no signs of systemic infection. 08/10/2019 upon evaluation today patient appears to be doing may be slightly better in regard to her left lower extremity the right lower extremity is doing much better. Fortunately there is no signs of infection right now which is good news. No fevers, chills, nausea, vomiting, or diarrhea. 08/24/2019 on evaluation today patient appears to be doing slightly better in regard to her lower extremities. The left lower extremity seems to be healed the right lower extremity is doing better though not completely healed as far as the openings are concerned. She has some generalized issues here with edema and weeping secondary to her lymphedema though again I do believe this is little bit drier compared to prior weeks evaluations. In general I am very pleased with how things seem to be progressing. No fevers, chills, nausea, vomiting, or diarrhea. 08/31/2019 upon evaluation today patient actually seems to making some progress here with regard to the left lower extremity in particular. She has been tolerating the dressing changes without complication. Fortunately there is no signs of active infection at this time. No fevers,  chills, nausea, vomiting, or diarrhea. She did see Dr. Doren Custard and he did note that she did have  a issue with the left great saphenous vein and the small saphenous vein in the leg. With that being said he was concerned about the possibility of laser ablation not being extremely successful. He also mentioned a small risk of DVT associated with the procedure. However if the wounds do not continue to improve he stated that that would probably be the way to go. Fortunately the patient's legs do seem to be doing much better. 09/07/2019 upon evaluation today patient appears to be doing better with regard to her lower extremities. She has been tolerating the dressing changes without complication. With that being said she is showing signs of improvement and overall very pleased. There are some areas on her leg that I think we do need to debride we discussed this last week the patient is in agreement with doing that as long as it does not hurt too badly. 09/14/2019 upon evaluation today patient appears to be doing decently well with regard to her left lower extremity. She is not having near as much weeping as she has had in the past things seem to be drying up which is good news. There is no signs of active infection at this time. 09/21/19 upon evaluation today patient appears to be doing better in regard overall to her bilateral lower extremities. She again has less open than she did previous and each week I feel like this is getting better. Fortunately there is no signs of active infection at this time. No fevers, chills, nausea, vomiting, or diarrhea. 09/28/2019 upon evaluation today patient actually appears to be showing signs of improvement with regard to her left lower extremity. Unfortunately the right medial lower extremity around the ankle region has reopened to some degree but this appears to be minimal still which is good news. There is no signs of active infection at this time which is also good news. 10/12/2019 upon evaluation today patient appears to be doing okay with regard to her bilateral  lower extremities today. The right is a little bit worse then last evaluation 2 weeks ago. The left is actually doing a little better in my opinion. Overall there is no signs of active infection at this time that I see. Obviously that something we have to keep a close eye on she is very prone to this with the significant and multiple openings that she has over the bilateral lower extremities. 10/19/2019 upon evaluation today patient appears to be doing about the best that I have seen her in quite some time. She has been tolerating the dressing changes without complication. There does not appear to be any signs of active infection and overall I am extremely happy with the way her legs appeared. She is drying up quite nicely and overall is having less pain. 11/09/2019 upon evaluation today patient appears to be doing better in regard to her wounds. She seems to be drying up more and more each time I see her this is just taking a very long time. Fortunately there is no signs of active infection at this time. Electronic Signature(s) Signed: 11/09/2019 11:11:43 AM By: [name] PA-C Entered By: [name] on 11/09/2019 11:11:43 -------------------------------------------------------------------------------- Physical Exam Details Patient Name: Date of Service: OKLA, QAZI 11/09/2019 10:00 A M Medical Record Number: [id number] Patient Account Number: [id number] Date of Birth/Sex: Treating RN: [birth year] (69 y.o. [name] Primary Care Provider: Tamala Julian, [place]  Other Clinician: Referring Provider: Treating Provider/Extender: Doyle Askew, FRED Weeks in Treatment: 19 Constitutional Well-nourished and well-hydrated in no acute distress. Respiratory normal breathing without difficulty. Psychiatric this patient is able to make decisions and demonstrates good insight into disease process. Alert and Oriented x 3. pleasant and cooperative. Notes Patient's wounds currently  showed signs of good granulation at this point. There does not appear to be any evidence of infection and overall very pleased with how things seem to be progressing. I am extremely happy with the progress that I see today. Electronic Signature(s) Signed: 11/09/2019 11:12:00 AM By: [name] PA-C Entered By: [name] on 11/09/2019 11:11:59 -------------------------------------------------------------------------------- Physician Orders Details Patient Name: Date of Service: [name]. 11/09/2019 10:00 A M Medical Record Number: [id number] Patient Account Number: [id number] Date of Birth/Sex: Treating RN: [birth year] (69 y.o. [name] Primary Care Provider: [name] Other Clinician: Referring Provider: Treating Provider/Extender: Doyle Askew, FRED Weeks in Treatment: [phone] Verbal / Phone Orders: No Diagnosis Coding ICD-10 Coding Code Description E11.622 Type 2 diabetes mellitus with other skin ulcer I89.0 Lymphedema, not elsewhere classified I87.331 Chronic venous hypertension (idiopathic) with ulcer and inflammation of right lower extremity I87.332 Chronic venous hypertension (idiopathic) with ulcer and inflammation of left lower extremity L97.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 leg with fat layer exposed L97.822 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 Dressing Change Frequency Wound #61 Left,Circumferential Lower Leg Change dressing three times week. Wound #64 Left,Dorsal Foot Change dressing three times week. Wound #65 Right,Medial Lower Leg Change dressing three times week. Skin  Barriers/Peri-Wound Care Barrier cream - zinc oxide cream to any macerated areas Moisturizing lotion - to dry skin Wound Cleansing Clean wound with Wound Cleanser - all wounds May shower with protection. Primary Wound Dressing Wound #61 Left,Circumferential Lower Leg Calcium Alginate with Silver Wound #64 Left,Dorsal Foot Calcium Alginate with Silver Wound #65 Right,Medial Lower Leg Calcium Alginate with Silver Secondary Dressing Wound #61 Left,Circumferential Lower Leg Dry Gauze - all wounds ABD pad - as needed Wound #64 Left,Dorsal Foot Dry Gauze - all wounds ABD pad - as needed Wound #65 Right,Medial Lower Leg Dry Gauze Edema Control 3 Layer Compression System - Bilateral void standing for long periods of time - walking is encouraged A Elevate legs to the level of the heart or above for 30 minutes daily and/or when sitting, a frequency of: - do not sleep in chair with feet dangling, MUST elevate legs while sitting Exercise regularly Segmental Compressive Device. - lymphedema pumps 60 minutes 1- 2 times per day Off-Loading Turn and reposition every 2 hours Additional Orders / Instructions Follow Nutritious Diet - T include vitamin A, vitamin C, and Zinc along with increased protein intake. o [facility] for wound care. - [facility] Electronic Signature(s) Signed: 11/09/2019 1:28:30 PM By: [name] PA-C Signed: 11/09/2019 2:12:16 PM By: [name] RN, BSN Entered By: [name] on 11/09/2019 11:10:46 -------------------------------------------------------------------------------- Problem List Details Patient Name: Date of Service: [name]. 11/09/2019 10:00 A M Medical Record Number: [id number] Patient Account Number: [id number] Date of Birth/Sex: Treating RN: 14-Mar-1951 (69 y.o. [name] Primary Care Provider: Tamala Julian,  FRED Other Clinician: Referring Provider: Treating Provider/Extender: Doyle Askew, FRED Weeks in Treatment: [phone] Active Problems ICD-10 Encounter Code Description Active Date MDM Diagnosis E11.622 Type 2 diabetes mellitus with other skin ulcer 08/05/2017 No Yes I89.0 Lymphedema, not elsewhere classified 08/05/2017 No Yes I87.331 Chronic venous hypertension (idiopathic) with ulcer and inflammation of right 08/05/2017 No Yes lower extremity I87.332 Chronic venous hypertension (idiopathic) with ulcer and inflammation of left 08/05/2017 No Yes lower extremity L97.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 leg with fat layer 08/05/2017 No Yes exposed L97.822 Non-pressure chronic ulcer of other part of left lower leg with fat layer exposed2/20/2019 No Yes L97.522 Non-pressure chronic ulcer of other part of left foot with fat layer exposed 06/01/2019 No Yes I10 Essential (primary) hypertension 08/05/2017 No Yes E66.01 Morbid (severe) obesity due to excess calories 08/05/2017 No Yes F41.8 Other specified anxiety disorders 08/05/2017 No Yes R53.1 Weakness 08/05/2017 No Yes Inactive Problems Resolved Problems Electronic Signature(s) Signed: 11/09/2019 10:35:10 AM By: [name] PA-C Entered By: [name] on 11/09/2019 10:35:09 -------------------------------------------------------------------------------- Progress Note Details Patient Name: Date of Service: [name]. 11/09/2019 10:00 A M Medical Record Number: [id number] Patient Account Number: [id number] Date of Birth/Sex: Treating RN: [birth year] (69 y.o. [name] Primary Care Provider: [name] Other Clinician: Referring Provider: Treating Provider/Extender: Doyle Askew, FRED Weeks in Treatment: [phone] Subjective Chief Complaint Information obtained from Patient Bilateral reoccurring LE ulcers History of Present Illness (HPI) this patient has been seen a couple of times before and returns with recurrent problems to her right and left lower extremity with  swelling and weeping ulcerations due to not wearing her compression stockings which she had been advised to do during her last discharge, at the end of June 2018. During her last visit the patient had had normal arterial blood flow and her venous reflux study did not necessitate any surgical intervention. She was recommended compression and elevation and wound care. After prolonged treatment the patient was completely healed but she has been noncompliant with wearing or compressions.. She was here last week with an outpatient return visit planned but the patient came in a very poor general condition with altered mental status and was rushed to the ER on my request. With a history of hypertension, diabetes, TIA and right-sided weakness she was set up for an MRI on her brain and cervical spine and was sent to [facility]. Getting an MRI done was very difficult but once the workup was done she was found not to have any spinal stenosis, epidural abscess or hematoma or discitis. This was radiculopathy to be treated as an outpatient and she was given a follow-up appointment. Today she is feeling much better alert and oriented and has come to reevaluate her bilateral lower extremity lymphedema and ulceration 03/25/2017 -- she was admitted to the hospital on 03/16/2017 and discharged on 03/18/2017 with left leg cellulitis and ulceration. She was started on vancomycin and Zosyn and x-ray showed no bony involvement. She was treated for a cellulitis with IV antibiotics changed to Rocephin and Flagyl and was discharged on oral Keflex and doxycycline to complete a 7 day course. Last hemoglobin A1c was 7.1 and her other ailments including hypertension got asthma were appropriately treated. 05/06/2017 -- she is awaiting the right size of compression stockings from [place] but other than that has been doing well. ====== Old notes 69 year old patient was seen one time last October and was lost to follow-up. She  has  recurrent problems with weeping and ulceration of her left lower extremity and has swelling of this for several years. It has been worse for the last 2 months. Past medical history is significant for diabetes mellitus type 2, hypertension, gout, morbid obesity, depressive disorders, hiatal hernia, migraines, status post knee surgery, risk of a cholecystectomy, vaginal hysterectomy and breast biopsy. She is not a smoker. As noted before she has never had a venous duplex study and an arterial ABI study was attempted but the left lower extremity was noncompressible 10/01/2016 -- had a lower extremity venous duplex reflux evaluation which showed no evidence of deep vein reflux in the right or left lower extremity, and no evidence of great saphenous vein reflux more than 500 ms in the right or left lower extremity, and the left small saphenous vein is incompetent but no vascular consult was recommended. review of her electronic medical records noted that the ABI was checked in July 2017 where the right ABI was normal limits and the left ABI could not be ascertained due to pain with cuff pressure but the waveforms are within normal limits. her arterial duplex study scheduled for April 27. 10/08/2016 -- the patient has various reasons for not having a compression on and for the last 3 days she has had no compression on her left lower extremity either due to pain or the lack of nursing help. She does not use her juxta lites either. 10/15/2016 -- the patient did not keep her appointment for arterial duplex study on April 27 and I have asked her to reschedule this. Her pain is out of proportion with the physical findings and she continuously fails to wear a compression wraps and cuts them off because she says she cannot tolerate the pain. She does not use her juxta lites either. 10/22/2016 -- he has rescheduled her arterial duplex study to May 21 and her pain today is a bit better. She has not been wearing her  juxta lites on her right lower extremity but now understands that she needs to do this. She did tolerate the to press compression wrap on her left lower extremity 10/29/2016 --arterial duplex study is scheduled for next week and overall she has been tolerating her compression wraps and also using her juxta lites on her right lower extremity 11/05/2016 -- the right ABI was 0.95 the left was 1.03. The digit TBI is on the right was 0.83 on the left was 0.92 and she had biphasic flow through these vessels. The impression was that of normal lower extremity arterial study. 11/12/2016 -- her pain is minimal and she is doing very well overall. 11/26/2016 -- she has got juxta lites and her insurance will not pay for additional dual layer compression stockings. She is going to order some from [facility]. 05/12/2017 -- her juxta lites are very old and too big for her and these have not been helping with compression. She did get 20-30 mm compression stockings from [place] but she and her husband are unable to put these on. I believe she will benefit from bilateral Extremit-ease, compression stockings and we will measure her for these today. 05/20/2017 -- lymphedema on the left lower extremity has increased a lot and she has a open ulceration as a result of this. The right lower extremity is looking pretty good. She has decided to by the compression stockings herself and will get reimbursed by the home health, at a later date. 05/27/2017 -- her sciatica is bothering her a lot and  she thought her left leg pain was caused due to the compression wrap and hence removed it and has significant lymphedema. There is no inflammation on this left lower extremity. 06/17/17 on evaluation today patient appears to be doing very well and in fact is completely healed in regard to her ulcerations. Unfortunately however she does have continued issues with lymphedema nonetheless. We did order compression garments for her unfortunately  she states that the size that she received were large although we ordered medium. Obviously this means she is not getting the optimal compression. She does not have those with her today and therefore we could not confirm and contact the company on her behalf. Nonetheless she does state that she is going to have her husband bring them by tomorrow so that we can verify and then get in touch with the company. No fevers, chills, nausea, or vomiting noted at this time. Overall patient is doing better otherwise and I'm pleased with the progress she has made. 07/01/17 on evaluation today patient appears to be doing very well in regard to her bilateral lower extremity she does not have any openings at this point which is excellent news. Overall I'm pleased with how things have progressed up to this time. Since she is doing so well we did order her compression which we are seeing her today to ensure that it fits her properly and everything is doing well in that regard and then subsequently she will be discharged. ============ Old Notes: 03/31/16 patient presents today for evaluation concerning open wounds that she has over the left medial ankle region as well as the left dorsal foot. She has previously had this occur although it has been healed for a number of months after having this for about a year prior until her hospitalization on 01/05/16. At that point in time it appears that she was admitted to the hospital for left lower extremity cellulitis and was placed on vancomycin and Zosyn at that point. Eventually upon discharge on January 15, 2016 she was placed on doxycycline at that point in time. Later on 03/27/16 positive wound culture growing Escherichia coli this was switched to amoxicillin. Currently she tells me that she is having pain radiated to be a 7 out of 10 which can be as high as 10 out of 10 with palpation and manipulation of the wound. This wound appears to be mainly venous in nature due to the  bilateral lower extremity venous stasis/lymphedema. This is definitely much worse on her left than the right side. She does have type 1 diabetes mellitus, hypertension, morbid obesity, and is wheelchair dependent.during the course of the hospital stay a blood culture was also obtained and fortunately appeared negative. She also had an x-ray of the tibia/fibula on the left which showed no acute bone abnormality. Her white blood cell count which was performed last on 03/25/16 was 7.3, hemoglobin 12.8, protein 7.1, albumin 3.0. Her urine culture appeared to be negative for any specific organisms. Patient did have a left lower extremity venous duplex evaluation for DVT . This did not include venous reflux studies but fortunately was negative for DVT Patient also had arterial studies performed which revealed that she had a . normal ABI on the right though this was unable to be performed on the left secondary to pain that she was having around the ankle region due to the wound. However it was stated on report that she had biphasic pulses and apparently good blood flow. ========== 06/03/17 she is here  in follow-up evaluation for right lower extremity ulcer. The right lower sure he has healed but she has reopened to the left medial malleolus and dorsal foot with weeping. She is waiting for new compression garments to arrive from home health, the previous compression garments were ill fitting. We will continue with compression bilaterally and follow-up in 2 weeks Readmission: 08/05/17 on evaluation today patient appears to be doing somewhat poorly in regard to her left lower extremity especially although the right lower extremity has a small area which may no longer be open. She has been having a lot of drainage from the left lower extremity however he tells me that she has not been able to use the EXTREMIT-EASE Compression at this point. She states that she did better and was able to actually apply the  Juxta-Lite compression although the wound that she has is too large and therefore really does not compress which is why she cannot wear it at this point. She has no one who can help her put it on regular basis her son can sometimes but he's not able to do it most of the time. I do believe that's why she has begun to weave and have issues as she is currently yet again. No fevers, chills, nausea, or vomiting noted at this time. Patient is no evidence of dementia. 08/12/17 on evaluation today patient appears to still be doing fairly well in regard to the draining areas/weeping areas at this point. With that being said she unfortunately did go to the ER yesterday due to what was felt to be possibly a cellulitis. They place her on doxycycline by mouth and discharge her home. She definitely was not admitted. With that being said she states she has had more discomfort which has been unusual for her even compared to prior times and she's had infections.08/12/17 on evaluation today patient appears to still be doing fairly well in regard to the draining areas/weeping areas at this point. With that being said she unfortunately did go to the ER yesterday due to what was felt to be possibly a cellulitis. They place her on doxycycline by mouth and discharge her home. She definitely was not admitted. With that being said she states she has had more discomfort which has been unusual for her even compared to prior times and she's had infections. 08/19/17 put evaluation today patient tells me that she's been having a lot of what sounds to be neuropathic type pain in regard to her left lower extremity. She has been using over-the-counter topical bins again which some believe. That in order to apply the she actually remove the wrap we put on her last Wednesday on Thursday. Subsequently she has not had anything on compression wise since that time. The good news is a lot of the weeping areas appear to have closed at this point  again I believe she would do better with compression but we are struggling to get her to actually use what she needs to at this point. No fevers, chills, nausea, or vomiting noted at this time. 09/03/17 on evaluation today patient appears to be doing okay in regard to her lower extremities in regard to the lymphedema and weeping. Fortunately she does not seem to show any signs of infection at this point she does have a little bit of weeping occurring in the right medial malleolus area. With that being said this does not appear to be too significant which is good news. 09/10/17; this is a patient with severe bilateral  secondary lymphedema secondary to chronic venous insufficiency. She has severe skin damage secondary to both of these features involving the dorsal left foot and medial left ankle and lower leg. Still has open areas in the left anterior foot. The area on the right closed over. She uses her own juxta light stockings. She does not have an arterial issue 09/16/17 on evaluation today patient actually appears to be doing excellent in regard to her bilateral lower extremity swelling. The Juxta-Lite compression wrap seem to be doing very well for her. She has not however been using the portion that goes over her foot. Her left foot still is draining a little bit not nearly as significant as it has been in the past but still I do believe that she likely needs to utilize the full wrap including the foot portion of this will improve as well. She also has been apparently putting on a significant amount of Vaseline which also think is not helpful for her. I recommended that if she feels she needs something for moisturizer Eucerin will probably be better. 09/30/17 on evaluation today patient presents with several new open areas in regard to her left lower extremity although these appear to be minimal and mainly seem to be more moisture breakdown than anything. Fortunately she does not seem to have any  evidence of infection which is great news. She has been tolerating the dressing changes without complication we are using silver alginate on the foot she has been using AB pads to have the legs and using her Juxta- Lite compression which seems to be controlling her swelling very well. Overall I'm pleased with the poor way she has progressed. 10/14/17 on evaluation today patient appears to be doing better in regard to her left lower extremity areas of weeping. She does still have some discomfort although in general this does not appear to be as macerated and I think it is progressing nicely. I do think she still needs to wear the foot portion of her Juxta- Lite in order to get the most benefit from the wrap obviously. She states she understands. Fortunately there does not appear to be evidence of infection at this time which is great news. 10/28/17 on evaluation today patient appears to be doing excellent in regard to her left lower extremity. She has just a couple areas that are still open and seem to be causing any trouble whatsoever. For that reason I think that she is definitely headed in the right direction the spots are very tiny compared to what we have been dealing with in the past. 11/11/17 on evaluation today patient appears to have a right lateral lower extremity ulcer that has opened since I last saw her. She states this is where the home health nurse that was coming out remove the dressing without wetting the alginate first. Nonetheless I do not know if this is indeed the case or not but more importantly we have not ordered home help to be coming out for her wounds at all. I'm unsure as to why they are coming out and we're gonna have to check on this and get things situated in that regard. With that being said we currently really do not need them to be coming out as the patient has been taking care of her leg herself without complication and no issues. In fact she was doing much better prior to  nursing coming out. 11/25/17 on evaluation today patient actually appears to be doing fairly well in regard to her  left lower extremity swelling. In fact she has very little area of weeping at this point there's just a small spot on the lateral portion of her right leg that still has me just a little bit more concerned as far as wanting to see this clear up before I discharge her to caring for this at home. Nonetheless overall she has made excellent progress. 12/09/17 on evaluation today patient appears to be doing rather well in regard to her lower extremity edema. She does have some weeping still in the left lower extremity although the big area we were taking care of two weeks ago actually has closed and she has another area of weeping on the left lower extremity immediately as well is the top of her foot. She does not currently have lymphedema pumps she has been wearing her compression daily on a regular basis as directed. With that being said I think she may benefit from lymphedema pumps. 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 We had a very difficult time getting and keeping this under control. 12/23/17 on evaluation today patient actually appears to be doing a little bit more poorly in regard to her bilateral lower extremities. She has been tolerating the Juxta-Lite compression wraps. Unfortunately she has two new ulcers on the right lower extremity and left lower Trinity ulceration seems to be larger. Obviously this is not good news. She has been tolerating the dressings without complication. 12/30/17 on evaluation today patient actually appears to be doing much better in regard to her bilateral lower extremity edema. She continues to have some issues with ulcerations and in fact there appears to be one spot on each leg where the wrap may have  caused a little bit of a blister which is subsequently opened up at this point is given her pain. Fortunately it does not appear to be any evidence of infection which is good news. No fevers chills noted. 01/13/18 on evaluation today patient appears to be doing rather well in regard to her bilateral lower extremities. The dressings did get kind of stuck as far as the wound beds are concerned but again I think this is mainly due to the fact that she actually seems to be showing signs of healing which is good news. She's not having as much drainage therefore she was having more of the dressing sticking. Nonetheless overall I feel like her swelling is dramatically down compared to previous. 01/20/18 on evaluation today patient unfortunately though she's doing better in most regards has a large blister on the left anterior lower extremity where she is draining quite significantly. Subsequently this is going to need debridement today in order to see what's underneath and ensure she does not continue to trapping fluid at this location. Nonetheless No fevers, chills, nausea, or vomiting noted at this time. 01/27/18 on evaluation today patient appears to be doing rather well at this point in regard to her right lower extremity there's just a very small area that she still has open at this point. With that being said I do believe that she is tolerating the compression wraps very well in making good progress. Home health is coming out at this point to see her. Her left lower extremity on the lateral portion is actually what still mainly open and causing her some discomfort for the most part 02/10/18 on evaluation  today patient actually appears to be doing very well in regard to her right lower extremity were all the ulcers appear to be completely close. In regard to the left lower extremity she does have two areas still open and some leaking from the dorsal surface of her foot but this still seems to be doing much  better to me in general. 02/24/18 on evaluation today patient actually appears to be doing much better in regard to her right lower extremity this is still completely healed. Her left lower extremity is also doing much better fortunately she has no evidence of infection. The one area that is gonna require some debridement is still on the left anterior shin. Fortunately this is not hurting her as badly today. 03/10/18 on evaluation today patient appears to be doing better in some regards although she has a little bit more open area on the dorsal foot and she also has some issues on the medial portion of the left lower extremity which is actually new and somewhat deep. With that being said there fortunately does not appear to be any significant signs of infection which is good news. No fevers, chills, nausea, or vomiting noted at this time. In general her swelling seems to be doing fairly well which is good news. 03/31/18 on evaluation today patient presents for follow-up concerning her left lower extremity lymphedema. Unfortunately she has been doing a little bit more poorly since I last saw her in regard to the amount of weeping that she is experiencing. She's also having some increased pain in the anterior shin location. Unfortunately I do not feel like the patient is making such good progress at this point a few weeks back she was definitely doing much better. 04/07/18 on evaluation today patient actually appears to be showing some signs of improvement as far as the left lower extremity is concerned. She has been tolerating the dressing changes and it does appear that the Drawtex did better for her. With that being said unfortunately home health is stating that they cannot obtain the Drawtex going forward. Nonetheless we're gonna have to check and see what they may be able to get the alginate they were using was getting stuck in causing new areas of skin being pulled all that with and subsequently weep  and calls her to worsen overall this is the first time we've seen improvement at this time. 04/14/18 on evaluation today patient actually appears to be doing rather well at this point there does not appear to be any evidence of infection at this time and she is actually doing excellent in regard to the weeping in fact she almost has no openings remaining even compared to just last week this is a dramatic improvement. No fevers chills noted 04/21/18 evaluation today patient actually appears to be doing very well. She in fact is has a small area on the posterior lower extremity location and she has a small area on the dorsal surface of her foot that are still open both of which are very close to closing. We're hoping this will be close shortly. She brought her Juxta-Lite wrap with her today hoping that would be able to put her in it unfortunately I don't think were quite at that point yet but we're getting closer. 04/28/18 upon evaluation today patient actually appears to be doing excellent in regard to her left lower extremity ulcer. In fact the region on the posterior lower extremity actually is much smaller than previously noted. Overall I'm very happy  with the progress she has made. She again did bring her Juxta-Lite although we're not quite ready for that yet. 05/11/18 upon evaluation today patient actually appears to be doing in general fairly well in regard to her left lower Trinity. The swelling is very well controlled. With that being said she has a new area on the left anterior lower extremity as well as between the first and second toes of her left foot that was not present during the last evaluation. The region of her posterior left lower extremity actually appears to be almost completely healed. T be honest I'm very pleased with o the way that stands. Nonetheless I do believe that the lotion may be keeping the area to moist as far as her legs are concerned subsequently I'm gonna consider  discontinuing that today. 05/26/18 on evaluation today patient appears to be doing rather well in regard to her left lower should be ulcers. In fact everything appears to be close except for a very small area on the left posterior lower extremity. Fortunately there does not appear to be any evidence of infection at this time. Overall very pleased with her progress. 06/02/18 and evaluation today patient actually appears to be doing very well in regard to her lower extremity ulcers. She has one small area that still continues to weep that I think may benefit her being able to justify lotion and user Juxta-Lite wraps versus continued to wrap her. Nonetheless I think this is something we can definitely look into at this point. 06/23/18 on evaluation today patient unfortunately has openings of her bilateral lower extremities. In general she seems to be doing much worse than when I last saw her just as far as her overall health standpoint is concerned. She states that her discomfort is mainly due to neuropathy she's not having any other issues otherwise. No fevers, chills, nausea, or vomiting noted at this time. 06/30/18 on evaluation today patient actually appears to be doing a little worse in regard to her right lower extremity her left lower extremity of doing fairly well. Fortunately there is no sign of infection at this time. She has been tolerating the dressing changes without complication. Home health did not come out like they were supposed to for the appropriate wrap changes. They stated that they never received the orders from [place] which were fax. Nonetheless we will send a copy of the orders with the patient today as well. 07/07/18 on evaluation today patient appears to be doing much better in regard to lower extremities. She still has several openings bilaterally although since I last saw her her legs did show obvious signs of infection when she later saw her nurse. Subsequently a culture was obtained  and she is been placed on Bactrim and Keflex. Fortunately things seem to be looking much better it does appear she likely had an infection. Again last week we'd even discussed it but again there really was not any obvious sign that she had infection therefore we held off on the antibiotics. Nonetheless I'm glad she's doing better today. 07/14/18 on evaluation today patient appears to be doing much better regarding her bilateral lower Trinity's. In fact on the right lower for me there's nothing open at this point there are some dry skin areas at the sites where she had infection. Fortunately there is no evidence of systemic infection which is excellent news. No fevers chills noted 07/21/18 on evaluation today patient actually appears to be doing much better in regard to her left  lower extremity ulcers. She is making good progress and overall I feel like she's improving each time I see her. She's having no pain I do feel like the infection is completely resolved which is excellent news. No fevers, chills, nausea, or vomiting noted at this time. 07/28/18 on evaluation today patient appears to be doing very well in regard to her left lower [employer]. Everything seems to be showing signs of improvement which is excellent news. Overall very pleased with the progress that has been made. Fortunately there's no evidence of active infection at this time also excellent news. 08/04/18 on evaluation today patient appears to be doing more poorly in regard to her bilateral lower extremities. She has two new areas open up on the right and these were completely closed as of last week. She still has the two spots on the left which in my pinion seem to be doing better. Fortunately there's no evidence of infection again at this point. 08/11/18 on evaluation today patient actually appears to be doing very well in regard to her bilateral lower Trinity wounds that all seem to be doing better and are measures smaller today.  Fortunately there's no signs of infection. No fevers, chills, nausea, or vomiting noted at this time. 08/18/18 on evaluation today patient actually appears to be doing about the same inverter bilateral lower extremities. She continues to have areas that blistering open as was drain that fortunately nothing too significant. Overall I feel like Drawtex may have done better for her however compared to the collagen. 08/25/18 on evaluation today patient appears to be doing a little bit more poorly today even compared to last time I saw her. Again I'm not exactly sure why she's making worse progress over the past several weeks. I'm beginning to wonder if there is some kind of underlying low level infection causing this issue. I did actually take a culture from the left anterior lower extremity but it was a new wound draining quite a bit at this point. Unfortunately she also seems to be having more pain which is what also makes me worried about the possibility of infection. This is despite never erythema noted at this point. 09/01/18 on evaluation today patient actually appears to be doing a little worse even compared to last week in regard to bilateral lower extremities. She did go to the hospital on the 16th was given a dose of IV Zosyn and then discharged with a recommendation to continue with the Bactrim that I previously prescribed for her. Nonetheless she is still having a lot of discomfort she tells me as well at this time. This is definitely unfortunate. No fevers, chills, nausea, or vomiting noted at this time. 09/08/18 on evaluation today patient's bilateral lower extremities actually appear to be shown signs of improvement which is good news. Fortunately there does not appear to be any signs of active infection I think the anabiotic is helping in this regard. Overall I'm very pleased with how she is progressing. 09/15/18 patient was actually seen in ER yesterday due to her legs as well unfortunately. She  states she's been having a lot of pain and discomfort as well as a lot of drainage. Upon inspection today the patient does have a lot of swelling and drainage I feel like this is more related to lymphedema and poor fluid control than it is to infection based on what I'm seeing. The physician in the emergency department also doubted that the patient was having a significant infection nonetheless I  see no evidence of infection obvious at this point although I do see evidence of poor fluid control. She still not using a compression pumps, she is not elevating due to her lift chair as well as her hospital bed being broken, and she really is not keeping her legs up as much as they should be and also has been taking off her wraps. All this combined I think has led to poor fluid control and to be honest she may be somewhat volume overloaded in general as well. I recommend that she may need to contact your physician to see if a prescription for a diuretic would be beneficial in their opinion. As long as this is safe I think it would likely help her. 09/29/18 on evaluation today patient's left lower extremity actually appears to be doing quite a bit better. At least compared to last time that I saw her. She still has a large area where she is draining from but there's a lot of new skin speckled trout and in fact there's more new skin that there are open areas of weeping and drainage at this point. This is good news. With regard to the right lower extremity this is doing much better with the only open area that I really see being a dry spot on the right lateral ankle currently. Fortunately there's no signs of active infection at this time which is good news. No fevers, chills, nausea, or vomiting noted at this time. The patient seems somewhat stressed and overwhelmed during the visit today she was very lethargic as such. She does and she is not taking any pain medications at this point. Apparently according to her  husband are also in the process of moving which is probably taking its toll on her as well. 10/06/18 on evaluation today patient appears to be doing rather well in regard to her lower extremities compared to last evaluation. Fortunately there's no signs of active infection. She tells me she did have an appointment with her primary. Nonetheless he was concerned that the wounds were somewhat deep based on pictures but we never actually saw her legs. She states that he had her somewhat worried due to the fact that she was fearing now that she was [place] have to have an amputation. With that being said based on what I'm seeing check she looks better this week that she has the last two times I've seen her with much less drainage I'm actually pleased in this regard. That doesn't mean that she's out of the water but again I do not think what the point of talking about education at all in regard to her leg. She is very happy to hear this. She is also not having as much pain as she was having last week. 10/13/18 unfortunately on evaluation today patient still continues to have a significant amount of drainage she's not letting home health actually apply the compression dressings at this point. She's trying to use of Juxta-Lite of the top of Kerlex and the second layer of the three layer compression wrap. With that being said she just does not seem to be making as good a progress as I would expect if she was having the compression applied and in place on a regular basis. No fevers, chills, nausea, or vomiting noted at this time. 10/20/18 on evaluation today patient appears to be doing a little better in regard to her bilateral lower extremity ulcers. In fact the right lower extremity seems to be healed she doesn't  even have any openings at this point left lower extremity though still somewhat macerated seems to be showing signs of new skin growth at multiple locations throughout. Fortunately there's no evidence of  active infection at this time. No fevers, chills, nausea, or vomiting noted at this time. 10/27/18 on evaluation today patient appears to be doing much better in regard to her left lower Trinity ulcer. She's been tolerating the laptop complication and has minimal drainage noted at this point. Fortunately there's no signs of active infection at this time. No fevers, chills, nausea, or vomiting noted at this time. 11/03/18 on evaluation today patient actually appears to be doing excellent in regard to her left lower extremity. She is having very little drainage at this point there does not appear to be any significant signs of infection overall very pleased with how things have gone. She is likewise extremely pleased still and seems to be making wonderful progress week to week. I do believe antibiotics were helpful for her. Her primary care provider did place on amateur clean since I last saw her. 11/17/18 on evaluation today patient appears to be doing worse in regard to her bilateral lower extremities at this point. She is been tolerating the dressing changes without complication. With that being said she typically takes the [place] off fairly quickly upon arriving home even after being seen here in the clinic and does not allow home health reapply command as part of the dressing at home. Therefore she said no compression essentially since I last saw her as best I can tell. With that being said I think it shows and how much swelling she has in the open wounds that are noted at this point. Fortunately there's no signs of infection but unfortunately if she doesn't get this under control I think she will end up with infection and more significant issues. 11/24/18 on evaluation today patient actually appears to be doing somewhat better in regard to her bilateral lower extremities. 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 Nonetheless that is doing better currently so she should be able to attempt reinitiating the lymphedema pumps at this time. No fevers, chills, nausea, or vomiting noted at this time. 12/01/18 upon evaluation today patient's left lower extremity appears to be doing quite well unfortunately her right lower extremity is not doing nearly as well. She has been tolerating the dressing changes without complication unfortunately she did not keep a wrap on the right at this time. Nonetheless I believe this has led to increased swelling and weeping in the world is actually much larger than during the last evaluation with her. 12/08/18 on evaluation today patient appears to be doing about the same at this point in regard to her right lower extremity. There is some more palatable to touch I'm concerned about the possibility of there being some infection although I think the main issue is she's not keeping her compression wrap on which in turn is not allowing this area to heal appropriately. 12/22/18 on evaluation today patient appears to be doing better in regard to left lower extremity unfortunately significantly worse in regard to the right lower extremity. The areas of blistering and necrotic superficial tissue have spread and again this does not really appear to be signs of infection and all she just doesn't seem to be  doing nearly as well is what she has been in the past. Overall I feel like the Augmentin did absolutely nothing for her she doesn't seem to have any infection again I really didn't think so last time either is more of a potential preventative measure and hoping that this would make some difference but I think the main issue is she's not wearing her compression. She tells me she cannot wear the [place] been we put on she takes it off pretty much upon getting home. Subsequently she worshiped Juxta-Lite when I questioned her about how often she  wears it this is no more than three hours a day obviously that leaves 21 hours that she has no compression and this is obviously not doing well for her. Overall I'm concerned that if things continue to worsen she is at great risk of both infection as well as losing her leg. 01/05/19 on evaluation today patient appears to be doing well in regard to her left lower extremity which he is allowing [place] to wrap and not so well with regard to her right lower extremity which she is not allowing [place] to really wrap and keep the wrap on. She states that it hurts too badly whenever it's wrapped and she ends up having to take it off. She's been using the Juxta-Lite she tells me up to six hours a day although I question whether or not that's really been the case to be honest. Previously she told me three hours today nonetheless obviously the legs as long as the wrap is doing great when she is not is doing much more poorly. 01/12/2019 on evaluation today patient actually appears to be doing a little better in my opinion with regard to her right lower extremity ulcer. She has a small open area on the left lower extremity unfortunately but again this I think is part of the normal fluctuation of what she is going to have to expect with regard to her legs especially when she is not using her lymphedema pumps on a regular basis. Subsequently based on what I am seeing today I think that she does seem to be doing slightly better with regard to her right lower extremity she did see her primary care provider on Monday they felt she had an infection and placed her on 2 antibiotics. Both Cipro and clindamycin. Subsequently again she seems possibly to be doing a little bit better in regards to the right lower extremity she also tells me however she has been wearing the compression wrap over the past week since I spoke with her as well that is a Kerlix and Coban wrap on the right. No fevers, chills, nausea, vomiting, or  diarrhea. 01/19/2019 on evaluation today patient appears to be doing better with regard to her bilateral lower extremities especially the right. I feel like the compression has been beneficial for her which is great news. She did get a call from her primary care provider on her way here today telling her that she did have methicillin- resistant Staphylococcus aureus and he was calling in a couple new antibiotics for her including a ointment to be applied she tells me 3 times a day. With that being said this sounds like likely to be Bactroban which I think could be applied with each dressing/wrap change but I would not be able to accommodate her applying this 3 times a day. She is in agreement with the least doing this we will add that to her orders today. 01/26/2019 on  evaluation today patient actually appears to be doing much better with regard to her right lower extremity. Her left lower extremity is also doing quite well all things considering. Fortunately there is no evidence of active infection at this time. No fevers, chills, nausea, vomiting, or diarrhea. 02/02/2019 on evaluation today patient appears to be doing much better compared to her last evaluation. Little by little off like her right leg is returning more towards normal. There does not appear to be any signs of active infection and overall she seems to be doing quite well which is great news. I am very pleased in this regard. No fevers, chills, nausea, vomiting, or diarrhea. 02/09/2019 upon evaluation today patient appears to be doing better with regard to her bilateral lower extremities. She has been tolerating the dressing changes without complication. Fortunately there is no signs of active infection at this time. No fevers, chills, nausea, vomiting, or diarrhea. 02/23/2019 on evaluation today patient actually appears to be doing quite well with regard to her bilateral lower extremities. She has been tolerating the dressing changes without  complication. She is even used her pumps one time and states that she really felt like it felt good. With that being said she seems to be in good spirits and her legs appear to be doing excellent. 03/09/2019 on evaluation today patient appears to be doing well with regard to her right lower extremity there are no open wounds at this time she is having some discomfort but I feel like this is more neuropathy than anything. With regard to her left lower extremity she had several areas scattered around that she does have some weeping and drainage from but again overall she does not appear to be having any significant issues and no evidence of infection at this time which is good news. 03/23/2019 on evaluation today patient appears to be doing well with regard to her right lower extremity which she tells me is still close she is using her juxta light here. Her left lower extremity she mainly just has an area on the foot which is still slightly draining although this also is doing great. Overall very pleased at this time. 04/06/2019 patient appears to be doing a little bit worse in regard to her left lower extremity upon evaluation today. She feels like this could be becoming infected again which she had issues with previous. Fortunately there is no signs of systemic infection but again this is always a struggle with her with her legs she will go from doing well to not so well in a very short amount of time. 04/20/2019 on evaluation today patient actually appears to be doing quite well with regard to her right lower extremity I do not see any signs of active infection at this time. Fortunately there is no fever chills noted. She is still taking the antibiotics which I prescribed for her at this point. In regard to the left lower extremity I do feel like some of these areas are better although again she still is having weeping from several locations at this time. 04/27/2019 on evaluation today patient appears  to be doing about the same if not slightly worse in regard to her left lower extremity ulcers. She tells me when questioned that she has been sleeping in her Hoveround chair in fact she tells me she falls asleep without even knowing it. I think she is spending a whole lot of time in the chair and less time walking and moving around which is not  good for her legs either. On top of that she is in a seated position which is also the worst position she is not really elevating her legs and she is also not using her lymphedema pumps. All this is good to contribute to worsening of her condition in general. 05/18/2019 on evaluation today patient appears to be doing well with regard to her lower extremity on the right in fact this is showing no signs of any open wounds at this time. On the left she is continuing to have issues with areas that do drain. Some of the regions have healed and there are couple areas that have reopened. She did go to the ER per the patient according to recommendations from the home health nurse due to what she was seen when she came out on 05/13/2019. Subsequently she felt like the patient needed to go to the hospital due to the fact that again she was having "milky white discharge" from her leg. Nonetheless she had and then was placed on doxycycline and subsequently seems to be doing better. 06/01/2019 upon evaluation today patient appears to be doing really in my opinion about the same. I do not see any signs of active infection which is good news. Overall she still has wounds over the bilateral lower extremities she has reopened on the right but this appears to be more of a crack where there is weeping/edema coming from the region. I do not see any evidence of infection at either site based on what I visualized today. 07/13/2019 upon evaluation today patient appears to be doing a little worse compared to last time I saw her. She since has been in the hospital from 06/21/2019 through  06/29/2019. This was secondary to having Covid. During that time they did apply lotion to her legs which unfortunately has caused her to develop a myriad of open wounds on her lower extremities. Her legs do appear to be doing better as far as the overall appearance is concerned but nonetheless she does have more open and weeping areas. 07/27/2019 upon evaluation today patient appears to be doing more poorly to be honest in regard to her left lower extremity in particular. There is no signs of systemic infection although I do believe she may have local infection. She notes she has been having a lot of blue/green drainage which is consistent potentially with Pseudomonas. That may be something that we need to consider here as well. The doxycycline does not seem to have been helping. 08/03/2019 upon evaluation today patient appears to be doing a little better in my opinion compared to last week's evaluation. Her culture I did review today and she is on appropriate medications to help treat the Enterobacter that was noted. Overall I feel like that is good news. With that being said she is unfortunately continuing to have a lot of drainage and though it is doing better I still think she has a long ways to go to get things dried up in general. Fortunately there is no signs of systemic infection. 08/10/2019 upon evaluation today patient appears to be doing may be slightly better in regard to her left lower extremity the right lower extremity is doing much better. Fortunately there is no signs of infection right now which is good news. No fevers, chills, nausea, vomiting, or diarrhea. 08/24/2019 on evaluation today patient appears to be doing slightly better in regard to her lower extremities. The left lower extremity seems to be healed the right lower extremity is doing  better though not completely healed as far as the openings are concerned. She has some generalized issues here with edema and weeping secondary to  her lymphedema though again I do believe this is little bit drier compared to prior weeks evaluations. In general I am very pleased with how things seem to be progressing. No fevers, chills, nausea, vomiting, or diarrhea. 08/31/2019 upon evaluation today patient actually seems to making some progress here with regard to the left lower extremity in particular. She has been tolerating the dressing changes without complication. Fortunately there is no signs of active infection at this time. No fevers, chills, nausea, vomiting, or diarrhea. She did see Dr. Doren Custard and he did note that she did have a issue with the left great saphenous vein and the small saphenous vein in the leg. With that being said he was concerned about the possibility of laser ablation not being extremely successful. He also mentioned a small risk of DVT associated with the procedure. However if the wounds do not continue to improve he stated that that would probably be the way to go. Fortunately the patient's legs do seem to be doing much better. 09/07/2019 upon evaluation today patient appears to be doing better with regard to her lower extremities. She has been tolerating the dressing changes without complication. With that being said she is showing signs of improvement and overall very pleased. There are some areas on her leg that I think we do need to debride we discussed this last week the patient is in agreement with doing that as long as it does not hurt too badly. 09/14/2019 upon evaluation today patient appears to be doing decently well with regard to her left lower extremity. She is not having near as much weeping as she has had in the past things seem to be drying up which is good news. There is no signs of active infection at this time. 09/21/19 upon evaluation today patient appears to be doing better in regard overall to her bilateral lower extremities. She again has less open than she did previous and each week I feel like this  is getting better. Fortunately there is no signs of active infection at this time. No fevers, chills, nausea, vomiting, or diarrhea. 09/28/2019 upon evaluation today patient actually appears to be showing signs of improvement with regard to her left lower extremity. Unfortunately the right medial lower extremity around the ankle region has reopened to some degree but this appears to be minimal still which is good news. There is no signs of active infection at this time which is also good news. 10/12/2019 upon evaluation today patient appears to be doing okay with regard to her bilateral lower extremities today. The right is a little bit worse then last evaluation 2 weeks ago. The left is actually doing a little better in my opinion. Overall there is no signs of active infection at this time that I see. Obviously that something we have to keep a close eye on she is very prone to this with the significant and multiple openings that she has over the bilateral lower extremities. 10/19/2019 upon evaluation today patient appears to be doing about the best that I have seen her in quite some time. She has been tolerating the dressing changes without complication. There does not appear to be any signs of active infection and overall I am extremely happy with the way her legs appeared. She is drying up quite nicely and overall is having less pain. 11/09/2019 upon evaluation  today patient appears to be doing better in regard to her wounds. She seems to be drying up more and more each time I see her this is just taking a very long time. Fortunately there is no signs of active infection at this time. Objective Constitutional Well-nourished and well-hydrated in no acute distress. Vitals Time Taken: 10:28 AM, Height: 62 in, Weight: 335 lbs, BMI: 61.3, Temperature: 98.2 F, Pulse: 93 bpm, Respiratory Rate: 20 breaths/min, Blood Pressure: 184/83 mmHg. Respiratory normal breathing without  difficulty. Psychiatric this patient is able to make decisions and demonstrates good insight into disease process. Alert and Oriented x 3. pleasant and cooperative. General Notes: Patient's wounds currently showed signs of good granulation at this point. There does not appear to be any evidence of infection and overall very pleased with how things seem to be progressing. I am extremely happy with the progress that I see today. Integumentary (Hair, Skin) Wound #61 status is Open. Original cause of wound was Gradually Appeared. The wound is located on the Left,Circumferential Lower Leg. The wound measures 12.5cm length x 23cm width x 0.2cm depth; 225.802cm^2 area and 45.16cm^3 volume. There is Fat Layer (Subcutaneous Tissue) Exposed exposed. There is no tunneling or undermining noted. There is a large amount of serous drainage noted. The wound margin is flat and intact. There is medium (34-66%) pink, pale granulation within the wound bed. There is a medium (34-66%) amount of necrotic tissue within the wound bed including Adherent Slough. Wound #64 status is Open. Original cause of wound was Gradually Appeared. The wound is located on the Left,Dorsal Foot. The wound measures 7cm length x 6.3cm width x 0.1cm depth; 34.636cm^2 area and 3.464cm^3 volume. There is Fat Layer (Subcutaneous Tissue) Exposed exposed. There is no tunneling or undermining noted. There is a medium amount of serous drainage noted. The wound margin is flat and intact. There is medium (34-66%) pink, pale granulation within the wound bed. There is a medium (34-66%) amount of necrotic tissue within the wound bed including Adherent Slough. Wound #65 status is Open. Original cause of wound was Gradually Appeared. The wound is located on the Right,Medial Lower Leg. The wound measures 0.1cm length x 0.1cm width x 0.1cm depth; 0.008cm^2 area and 0.001cm^3 volume. There is Fat Layer (Subcutaneous Tissue) Exposed exposed. There is no tunneling  or undermining noted. There is a small amount of serosanguineous drainage noted. The wound margin is distinct with the outline attached to the wound base. There is large (67-100%) pink granulation within the wound bed. There is no necrotic tissue within the wound bed. 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 There was a Three Layer Compression Therapy Procedure by Carlene Coria, RN. Post procedure Diagnosis Wound #61: Same as Pre-Procedure Wound #65 Pre-procedure diagnosis of Wound #65 is a Venous Leg Ulcer located on the Right,Medial Lower Leg . There was a Three  Layer Compression Therapy Procedure by Carlene Coria, RN. Post procedure Diagnosis Wound #65: Same as Pre-Procedure Plan Follow-up Appointments: Return Appointment in 2 weeks. Dressing Change Frequency: Wound #61 Left,Circumferential Lower Leg: Change dressing three times week. Wound #64 Left,Dorsal Foot: Change dressing three times week. Wound #65 Right,Medial Lower Leg: Change dressing three times week. Skin Barriers/Peri-Wound Care: Barrier cream - zinc oxide cream to any macerated areas Moisturizing lotion - to dry skin Wound Cleansing: Clean wound with Wound Cleanser - all  wounds May shower with protection. Primary Wound Dressing: Wound #61 Left,Circumferential Lower Leg: Calcium Alginate with Silver Wound #64 Left,Dorsal Foot: Calcium Alginate with Silver Wound #65 Right,Medial Lower Leg: Calcium Alginate with Silver Secondary Dressing: Wound #61 Left,Circumferential Lower Leg: Dry Gauze - all wounds ABD pad - as needed Wound #64 Left,Dorsal Foot: Dry Gauze - all wounds ABD pad - as needed Wound #65 Right,Medial Lower Leg: Dry Gauze Edema Control: 3 Layer Compression System - Bilateral Avoid standing for long periods of time - walking is encouraged Elevate legs to the level of the heart or above for 30 minutes daily and/or when sitting, a frequency of: - do not sleep in chair with feet dangling, MUST elevate legs while sitting Exercise regularly Segmental Compressive Device. - lymphedema pumps 60 minutes 1- 2 times per day Off-Loading: Turn and reposition every 2 hours Additional Orders / Instructions: Follow Nutritious Diet - T include vitamin A, vitamin C, and Zinc along with increased protein intake. o Home Health: [facility] for [facility]. - [facility]. I would recommend currently that we going continue with the wound care measures as before utilizing the silver alginate to the open areas and the Desitin to protect the good skin. 2. I am also can recommend at this time that the patient continue to monitor for any signs of infection I do not see anything right now but we will keep a close eye on this. 3. We will also continue with 3 layer compression wrap bilaterally. 4. She should be elevating and using her lymphedema pumps as well. We will see patient back for reevaluation in 1 week here in the clinic. If anything worsens or changes patient will contact our office for additional recommendations. Electronic Signature(s) Signed: 11/09/2019 11:12:53 AM By: [name] PA-C Entered By: [name] on  11/09/2019 11:12:52 -------------------------------------------------------------------------------- SuperBill Details Patient Name: Date of Service: [name] 11/09/2019 Medical Record Number: [id number] Patient Account Number: [id number] Date of Birth/Sex: Treating RN: [birth year] (69 y.o. [name] Primary Care Provider: [name] Other Clinician: Referring Provider: Treating Provider/Extender: Doyle Askew, FRED Weeks in Treatment: 118 Diagnosis Coding ICD-10 Codes Code Description E11.622 Type 2 diabetes mellitus with other skin ulcer I89.0 Lymphedema, not elsewhere classified I87.331 Chronic venous hypertension (idiopathic) with ulcer and inflammation of right lower extremity I87.332 Chronic venous hypertension (idiopathic) with ulcer and inflammation of left lower extremity L97.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 leg with fat layer exposed L97.822 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 I10 Essential (primary) hypertension E66.01 Morbid (severe) obesity due to excess calories F41.8 Other specified anxiety disorders R53.1 Weakness Facility Procedures The patient participates with Medicare or their insurance follows the Medicare Facility Guidelines: CPT4 Description Modifier Quantity Code [id number] BILATERAL: Application of multi-layer venous compression system; leg (below knee), including ankle and 1  foot. Physician Procedures : CPT4 Code Description Modifier [id number] - WC PHYS LEVEL 3 - EST PT ICD-10 Diagnosis Description E11.622 Type 2 diabetes mellitus with other skin ulcer I89.0 Lymphedema, not elsewhere classified I87.331 Chronic venous hypertension (idiopathic) with  ulcer and inflammation of right lower extremity I87.332 Chronic venous hypertension (idiopathic) with ulcer and inflammation  of left lower extremity Quantity: 1 Electronic Signature(s) Signed: 11/09/2019 11:13:15 AM By: [name] PA-C Entered By: [name] on 11/09/2019 11:13:13

## 2019-11-11 NOTE — Progress Notes (Signed)
[name], [name] ([id number]) Visit Report for 11/09/2019 Arrival Information Details Patient Name: Date of Service: [name], [name] 11/09/2019 10:00 A M Medical Record Number: [id number] Patient Account Number: [id number] Date of Birth/Sex: Treating RN: 1950/11/27 (69 y.o. [name] Primary Care Candid Bovey: [name] Other Clinician: Referring Ticia Virgo: Treating Lealon Vanputten/Extender: Doyle Askew, FRED Weeks in Treatment: 118 Visit Information History Since Last Visit Added or deleted any medications: No Patient Arrived: Wheel Chair Any new allergies or adverse reactions: No Arrival Time: 10:27 Had a fall or experienced change in No Accompanied By: alone activities of daily living that may affect Transfer Assistance: None risk of falls: Patient Identification Verified: Yes Signs or symptoms of abuse/neglect since last visito No Secondary Verification Process Completed: Yes Hospitalized since last visit: No Patient Requires Transmission-Based Precautions: No Implantable device outside of the clinic excluding No Patient Has Alerts: Yes cellular tissue based products placed in the center Patient Alerts: R ABI= 1.01 since last visit: L ABI = .99 Has Dressing in Place as Prescribed: No Has Compression in Place as Prescribed: No Pain Present Now: No Electronic Signature(s) Signed: 11/10/2019 5:36:53 PM By: Levan Hurst RN, BSN Entered By: Levan Hurst on 11/09/2019 10:28:35 -------------------------------------------------------------------------------- Compression Therapy Details Patient Name: Date of Service: [name] Duke. 11/09/2019 10:00 A M Medical Record Number: [id number] Patient Account Number: [id number] Date of Birth/Sex: Treating RN: 1951/03/07 (69 y.o. [name] Primary Care Chesnie Capell: [name] Other Clinician: Referring Markala Sitts: Treating Jamilya Sarrazin/Extender: Doyle Askew, FRED Weeks in Treatment: 118 Compression Therapy  Performed for Wound Assessment: Wound #61 Left,Circumferential Lower Leg Performed By: Clinician Carlene Coria, RN Compression Type: Three Layer Post Procedure Diagnosis Same as Pre-procedure Electronic Signature(s) Signed: 11/09/2019 2:12:16 PM By: [name] RN, BSN Entered By: [name] on 11/09/2019 11:08:17 -------------------------------------------------------------------------------- Compression Therapy Details Patient Name: Date of Service: [name] Duke. 11/09/2019 10:00 A M Medical Record Number: [id number] Patient Account Number: [id number] Date of Birth/Sex: Treating RN: 1951/01/03 (69 y.o. [name] Primary Care Jermari Tamargo: [name] Other Clinician: Referring Jawann Urbani: Treating Jaquanna Ballentine/Extender: Doyle Askew, FRED Weeks in Treatment: 118 Compression Therapy Performed for Wound Assessment: Wound #65 Right,Medial Lower Leg Performed By: Clinician Carlene Coria, RN Compression Type: Three Layer Post Procedure Diagnosis Same as Pre-procedure Electronic Signature(s) Signed: 11/09/2019 2:12:16 PM By: [name] RN, BSN Entered By: [name] on 11/09/2019 11:08:17 -------------------------------------------------------------------------------- Encounter Discharge Information Details Patient Name: Date of Service: [name] Duke. 11/09/2019 10:00 A M Medical Record Number: [id number] Patient Account Number: [id number] Date of Birth/Sex: Treating RN: 02-20-1951 (69 y.o. [name] Primary Care Laurent Cargile: [name] Other Clinician: Referring Orey Moure: Treating Jurline Folger/Extender: Doyle Askew, FRED Weeks in Treatment: [phone] Encounter Discharge Information Items Discharge Condition: Stable Ambulatory Status: Wheelchair Discharge Destination: Home Transportation: Private Auto Accompanied By: self Schedule Follow-up Appointment: Yes Clinical Summary of Care: Patient Declined Electronic Signature(s) Signed:  11/11/2019 9:26:18 AM By: Carlene Coria RN Entered By: Carlene Coria on 11/09/2019 11:43:35 -------------------------------------------------------------------------------- Lower Extremity Assessment Details Patient Name: Date of Service: [name], [name] 11/09/2019 10:00 A M Medical Record Number: [id number] Patient Account Number: [id number] Date of Birth/Sex: Treating RN: March 06, 1951 (69 y.o. [name] Primary Care Eria Lozoya: [name] Other Clinician: Referring Renee Beale: Treating Zarek Relph/Extender: Doyle Askew, FRED Weeks in Treatment: 118 Edema Assessment Assessed: [Left: No] [Right: No] Edema: [Left: Yes] [Right: Yes] Calf Left: Right: Point of Measurement: 37 cm From Medial Instep 38 cm 39 cm Ankle Left: Right: Point of  Measurement: 10 cm From Medial Instep 29.5 cm 23 cm Vascular Assessment Pulses: Dorsalis Pedis Palpable: [Left:Yes] [Right:Yes] Electronic Signature(s) Signed: 11/10/2019 5:36:53 PM By: Levan Hurst RN, BSN Entered By: Levan Hurst on 11/09/2019 10:31:22 -------------------------------------------------------------------------------- [place] Details Patient Name: Date of Service: [name] Duke. 11/09/2019 10:00 A M Medical Record Number: [id number] Patient Account Number: [id number] Date of Birth/Sex: Treating RN: Jul 20, 1950 (69 y.o. [name] Primary Care Keiondra Brookover: [name] Other Clinician: Referring Yaden Seith: Treating Geraldina Parrott/Extender: Doyle Askew, FRED Weeks in Treatment: [phone] Active Inactive Venous Leg Ulcer Nursing Diagnoses: Actual venous Insuffiency (use after diagnosis is confirmed) Knowledge deficit related to disease process and management Goals: Patient will maintain optimal edema control Date Initiated: 08/12/2017 Target Resolution Date: 11/16/2019 Goal Status: Active Patient/caregiver will verbalize understanding of disease process and disease management Date  Initiated: 08/12/2017 Date Inactivated: 04/21/2018 Target Resolution Date: 04/24/2018 Goal Status: Met Interventions: Assess peripheral edema status every visit. Compression as ordered Treatment Activities: Therapeutic compression applied : 08/12/2017 Notes: Wound/Skin Impairment Nursing Diagnoses: Impaired tissue integrity Knowledge deficit related to ulceration/compromised skin integrity Goals: Patient/caregiver will verbalize understanding of skin care regimen Date Initiated: 08/12/2017 Target Resolution Date: 11/16/2019 Goal Status: Active Ulcer/skin breakdown will have a volume reduction of 30% by week 4 Date Initiated: 08/05/2017 Date Inactivated: 09/30/2017 Target Resolution Date: 10/03/2017 Goal Status: Met Ulcer/skin breakdown will have a volume reduction of 50% by week 8 Date Initiated: 09/30/2017 Date Inactivated: 10/28/2017 Target Resolution Date: 10/28/2017 Goal Status: Met Interventions: Assess patient/caregiver ability to perform ulcer/skin care regimen upon admission and as needed Assess ulceration(s) every visit Provide education on ulcer and skin care Screen for HBO Treatment Activities: Patient referred to home care : 08/05/2017 Skin care regimen initiated : 08/05/2017 Topical wound management initiated : 08/05/2017 Notes: Electronic Signature(s) Signed: 11/09/2019 2:12:16 PM By: [name] RN, BSN Entered By: [name] on 11/09/2019 10:25:58 -------------------------------------------------------------------------------- Pain Assessment Details Patient Name: Date of Service: [name] Duke. 11/09/2019 10:00 A M Medical Record Number: [id number] Patient Account Number: [id number] Date of Birth/Sex: Treating RN: 01-22-1951 (69 y.o. [name] Primary Care Annmarie Plemmons: [name] Other Clinician: Referring Akyla Vavrek: Treating Enslee Bibbins/Extender: Doyle Askew, FRED Weeks in Treatment: [phone] Active Problems Location of Pain Severity and  Description of Pain Patient Has Paino No Site Locations Pain Management and Medication Current Pain Management: Electronic Signature(s) Signed: 11/10/2019 5:36:53 PM By: Levan Hurst RN, BSN Entered By: Levan Hurst on 11/09/2019 10:29:05 -------------------------------------------------------------------------------- Patient/Caregiver Education Details Patient Name: Date of Service: [name], [name] 5/26/2021andnbsp10:00 [place] Record Number: [id number] Patient Account Number: [id number] Date of Birth/Gender: Treating RN: 06/06/1951 (69 y.o. [name] Primary Care Physician: [name] Other Clinician: Referring Physician: Treating Physician/Extender: Doyle Askew, FRED Weeks in Treatment: [phone] Education Assessment Education Provided To: Patient Education Topics Provided Venous: Methods: Explain/Verbal Responses: Reinforcements needed, State content correctly Wound/Skin Impairment: Methods: Explain/Verbal Responses: Reinforcements needed, State content correctly Electronic Signature(s) Signed: 11/09/2019 2:12:16 PM By: [name] RN, BSN Entered By: [name] on 11/09/2019 10:26:28 -------------------------------------------------------------------------------- Wound Assessment Details Patient Name: Date of Service: [name] Duke. 11/09/2019 10:00 A M Medical Record Number: [id number] Patient Account Number: [id number] Date of Birth/Sex: Treating RN: 1950-09-25 (69 y.o. [name] Primary Care Corda Shutt: [name] Other Clinician: Referring Kyliana Standen: Treating Jehan Ranganathan/Extender: Doyle Askew, FRED Weeks in Treatment: 118 Wound Status Wound Number: 61 Primary Venous Leg Ulcer Etiology: Wound Location: Left, Circumferential Lower Leg Wound Open Wounding Event: Gradually Appeared Status:  Date Acquired: 04/20/2019 Comorbid Asthma, Hypertension, Peripheral Arterial Disease, Peripheral Weeks Of Treatment:  29 History: Venous Disease, Type II Diabetes, Gout, Osteoarthritis Clustered Wound: No Photos Photo Uploaded By: Mikeal Hawthorne on 11/10/2019 15:14:02 Wound Measurements Length: (cm) 12.5 Width: (cm) 23 Depth: (cm) 0.2 Area: (cm) 225.802 Volume: (cm) 45.16 % Reduction in Area: -2894.7% % Reduction in Volume: -5889.4% Epithelialization: Small (1-33%) Tunneling: No Undermining: No Wound Description Classification: Full Thickness Without Exposed Support Structures Wound Margin: Flat and Intact Exudate Amount: Large Exudate Type: Serous Exudate Color: amber Foul Odor After Cleansing: No Slough/Fibrino Yes Wound Bed Granulation Amount: Medium (34-66%) Exposed Structure Granulation Quality: Pink, Pale Fascia Exposed: No Necrotic Amount: Medium (34-66%) Fat Layer (Subcutaneous Tissue) Exposed: Yes Necrotic Quality: Adherent Slough Tendon Exposed: No Muscle Exposed: No Joint Exposed: No Bone Exposed: No Treatment Notes Wound #61 (Left, Circumferential Lower Leg) 1. Cleanse With Wound Cleanser Soap and water 2. Periwound Care Barrier cream 3. Primary Dressing Applied Calcium Alginate Ag 4. Secondary Dressing ABD Pad Dry Gauze 6. Support Layer Applied 3 layer compression wrap Notes netting Electronic Signature(s) Signed: 11/10/2019 5:36:53 PM By: Levan Hurst RN, BSN Entered By: Levan Hurst on 11/09/2019 10:34:44 -------------------------------------------------------------------------------- Wound Assessment Details Patient Name: Date of Service: [name] Duke. 11/09/2019 10:00 A M Medical Record Number: [id number] Patient Account Number: [id number] Date of Birth/Sex: Treating RN: [birth year] (69 y.o. [name] Primary Care Bela Nyborg: Other Clinician: [name] Referring Virgil Slinger: Treating Dejanique Ruehl/Extender: Doyle Askew, FRED Weeks in Treatment: 118 Wound Status Wound Number: 64 Primary Diabetic Wound/Ulcer of the Lower  Extremity Etiology: Wound Location: Left, Dorsal Foot Wound Open Wounding Event: Gradually Appeared Status: Date Acquired: 06/01/2019 Comorbid Asthma, Hypertension, Peripheral Arterial Disease, Peripheral Weeks Of Treatment: 23 History: Venous Disease, Type II Diabetes, Gout, Osteoarthritis Clustered Wound: No Photos Photo Uploaded By: Mikeal Hawthorne on 11/10/2019 15:14:03 Wound Measurements Length: (cm) 7 % Re Width: (cm) 6.3 % Re Depth: (cm) 0.1 Epit Area: (cm) 34.636 Tun Volume: (cm) 3.464 Und duction in Area: -20891.5% duction in Volume: -6969.4% helialization: Small (1-33%) neling: No ermining: No Wound Description Classification: Grade 1 Foul Wound Margin: Flat and Intact Slou Exudate Amount: Medium Exudate Type: Serous Exudate Color: amber Odor After Cleansing: No gh/Fibrino Yes Wound Bed Granulation Amount: Medium (34-66%) Exposed Structure Granulation Quality: Pink, Pale Fascia Exposed: No Necrotic Amount: Medium (34-66%) Fat Layer (Subcutaneous Tissue) Exposed: Yes Necrotic Quality: Adherent Slough Tendon Exposed: No Muscle Exposed: No Joint Exposed: No Bone Exposed: No Treatment Notes Wound #64 (Left, Dorsal Foot) 1. Cleanse With Wound Cleanser Soap and water 2. Periwound Care Barrier cream 3. Primary Dressing Applied Calcium Alginate Ag 4. Secondary Dressing ABD Pad Dry Gauze 6. Support Layer Applied 3 layer compression wrap Notes netting Electronic Signature(s) Signed: 11/10/2019 5:36:53 PM By: Levan Hurst RN, BSN Entered By: Levan Hurst on 11/09/2019 10:35:08 -------------------------------------------------------------------------------- Wound Assessment Details Patient Name: Date of Service: [name] Duke. 11/09/2019 10:00 A M Medical Record Number: [id number] Patient Account Number: [id number] Date of Birth/Sex: Treating RN: June 30, 1950 (69 y.o. [name] Primary Care Franchesca Veneziano: [name] Other  Clinician: Referring Delilah Mulgrew: Treating Alila Sotero/Extender: Doyle Askew, FRED Weeks in Treatment: 118 Wound Status Wound Number: 65 Primary Venous Leg Ulcer Etiology: Wound Location: Right, Medial Lower Leg Wound Open Wounding Event: Gradually Appeared Status: Date Acquired: 09/28/2019 Comorbid Asthma, Hypertension, Peripheral Arterial Disease, Peripheral Weeks Of Treatment: 6 History: Venous Disease, Type II Diabetes, Gout, Osteoarthritis Clustered Wound: No Photos Photo Uploaded By: Mikeal Hawthorne on 11/10/2019 15:14:34  Wound Measurements Length: (cm) 0.1 Width: (cm) 0.1 Depth: (cm) 0.1 Area: (cm) 0.008 Volume: (cm) 0.001 % Reduction in Area: 99.3% % Reduction in Volume: 99.1% Epithelialization: Large (67-100%) Tunneling: No Undermining: No Wound Description Classification: Full Thickness Without Exposed Support Structures Wound Margin: Distinct, outline attached Exudate Amount: Small Exudate Type: Serosanguineous Exudate Color: red, brown Foul Odor After Cleansing: No Slough/Fibrino No Wound Bed Granulation Amount: Large (67-100%) Exposed Structure Granulation Quality: Pink Fascia Exposed: No Necrotic Amount: None Present (0%) Fat Layer (Subcutaneous Tissue) Exposed: Yes Tendon Exposed: No Muscle Exposed: No Joint Exposed: No Bone Exposed: No Treatment Notes Wound #65 (Right, Medial Lower Leg) 1. Cleanse With Wound Cleanser Soap and water 2. Periwound Care Barrier cream 3. Primary Dressing Applied Calcium Alginate Ag 4. Secondary Dressing ABD Pad Dry Gauze 6. Support Layer Applied 3 layer compression wrap Notes netting Electronic Signature(s) Signed: 11/10/2019 5:36:53 PM By: Levan Hurst RN, BSN Entered By: Levan Hurst on 11/09/2019 10:35:32 -------------------------------------------------------------------------------- Vitals Details Patient Name: Date of Service: [name] Duke. 11/09/2019 10:00 A M Medical Record Number:  [id number] Patient Account Number: [id number] Date of Birth/Sex: Treating RN: Apr 21, 1951 (69 y.o. [name] Primary Care Stephaniemarie Stoffel: [name] Other Clinician: Referring Ordean Fouts: Treating Kristan Brummitt/Extender: Doyle Askew, FRED Weeks in Treatment: 118 Vital Signs Time Taken: 10:28 Temperature (F): 98.2 Height (in): 62 Pulse (bpm): 93 Weight (lbs): 335 Respiratory Rate (breaths/min): 20 Body Mass Index (BMI): 61.3 Blood Pressure (mmHg): 184/83 Reference Range: 80 - 120 mg / dl Electronic Signature(s) Signed: 11/10/2019 5:36:53 PM By: Levan Hurst RN, BSN Entered By: Levan Hurst on 11/09/2019 10:28:59

## 2019-11-18 ENCOUNTER — Emergency Department (HOSPITAL_COMMUNITY)
Admission: EM | Admit: 2019-11-18 | Discharge: 2019-11-18 | Disposition: A | Discharge status: Home / Self Care | Payer: Medicare PPO | Attending: Emergency Medicine | Admitting: Emergency Medicine

## 2019-11-18 ENCOUNTER — Encounter (HOSPITAL_COMMUNITY): Payer: Self-pay

## 2019-11-18 ENCOUNTER — Other Ambulatory Visit: Payer: Self-pay

## 2019-11-18 DIAGNOSIS — R112 Nausea with vomiting, unspecified: Secondary | ICD-10-CM | POA: Diagnosis not present

## 2019-11-18 DIAGNOSIS — R109 Unspecified abdominal pain: Secondary | ICD-10-CM | POA: Diagnosis present

## 2019-11-18 DIAGNOSIS — G43909 Migraine, unspecified, not intractable, without status migrainosus: Secondary | ICD-10-CM | POA: Diagnosis not present

## 2019-11-18 LAB — CBC
HCT: 41.7 % (ref 36.0–46.0)
Hemoglobin: 13 g/dL (ref 12.0–15.0)
MCH: 28.4 pg (ref 26.0–34.0)
MCHC: 31.2 g/dL (ref 30.0–36.0)
MCV: 91.2 fL (ref 80.0–100.0)
Platelets: 307 10*3/uL (ref 150–400)
RBC: 4.57 MIL/uL (ref 3.87–5.11)
RDW: 17.2 % — ABNORMAL HIGH (ref 11.5–15.5)
WBC: 9.9 10*3/uL (ref 4.0–10.5)
nRBC: 0 % (ref 0.0–0.2)

## 2019-11-18 LAB — COMPREHENSIVE METABOLIC PANEL
ALT: 15 U/L (ref 0–44)
AST: 14 U/L — ABNORMAL LOW (ref 15–41)
Albumin: 3.5 g/dL (ref 3.5–5.0)
Alkaline Phosphatase: 110 U/L (ref 38–126)
Anion gap: 11 (ref 5–15)
BUN: 15 mg/dL (ref 8–23)
CO2: 25 mmol/L (ref 22–32)
Calcium: 9.2 mg/dL (ref 8.9–10.3)
Chloride: 102 mmol/L (ref 98–111)
Creatinine, Ser: 0.8 mg/dL (ref 0.44–1.00)
GFR calc Af Amer: >60 mL/min (ref >60)
GFR calc non Af Amer: >60 mL/min (ref >60)
Glucose, Bld: 215 mg/dL — ABNORMAL HIGH (ref 70–99)
Potassium: 4.1 mmol/L (ref 3.5–5.1)
Sodium: 138 mmol/L (ref 135–145)
Total Bilirubin: 0.5 mg/dL (ref 0.3–1.2)
Total Protein: 8.7 g/dL — ABNORMAL HIGH (ref 6.5–8.1)

## 2019-11-18 LAB — LIPASE, BLOOD: Lipase: 25 U/L (ref 11–51)

## 2019-11-18 MED ORDER — DIPHENHYDRAMINE HCL 50 MG/ML IJ SOLN
25.0000 mg | Freq: Once | INTRAMUSCULAR | Status: AC
  Administered 2019-11-18: 25 mg via INTRAVENOUS
  Filled 2019-11-18: qty 1

## 2019-11-18 MED ORDER — DEXAMETHASONE SODIUM PHOSPHATE 10 MG/ML IJ SOLN
10.0000 mg | Freq: Once | INTRAMUSCULAR | Status: AC
  Administered 2019-11-18: 10 mg via INTRAVENOUS
  Filled 2019-11-18: qty 1

## 2019-11-18 MED ORDER — SODIUM CHLORIDE 0.9% FLUSH: 3.0000 mL | Freq: Once | INTRAVENOUS | Status: DC

## 2019-11-18 MED ORDER — METOCLOPRAMIDE HCL 5 MG/ML IJ SOLN
10.0000 mg | Freq: Once | INTRAMUSCULAR | Status: AC
  Administered 2019-11-18: 10 mg via INTRAVENOUS
  Filled 2019-11-18: qty 2

## 2019-11-18 MED ORDER — ONDANSETRON 4 MG PO TBDP: 4.0000 mg | ORAL_TABLET | Freq: Once | ORAL | Status: DC

## 2019-11-18 MED ORDER — LACTATED RINGERS IV BOLUS
1000.0000 mL | Freq: Once | INTRAVENOUS | Status: AC
  Administered 2019-11-18: 1000 mL via INTRAVENOUS

## 2019-11-18 NOTE — ED Triage Notes (Signed)
Arrived by EMS from home. Patient reports abdominal pain, nausea, and vomiting for past 2 hours. CBG: 220

## 2019-11-18 NOTE — Discharge Instructions (Addendum)
Your abdominal pain and vomiting was likely caused from the headache.  Try and keep yourself hydrated.  Follow-up with your doctor as needed.

## 2019-11-18 NOTE — ED Notes (Signed)
Patient found to have soaked the bed and her leg dressing. Changed linens, cleaned patient up and re-dressed diabetic leg ulcer on LLE. Ride called. Patient dressed in home clothes and will call out when ride is here.

## 2019-11-18 NOTE — ED Provider Notes (Signed)
  Physical Exam  BP 139/84 (BP Location: Right Arm)   Pulse 73   Temp 97.7 F (36.5 C) (Oral)   Resp (!) 21   SpO2 96%   Physical Exam  ED Course/Procedures     Procedures  MDM  Received patient in signout.  Abdominal pain nausea vomiting from 2 hours.  However started with a headache.  History of migraines and this felt similar.  Lab work reassuring.  Feels much better after treatment.  Eager to go home and will discharge with outpatient follow-up as needed.       Benjiman Core, MD 11/18/19 [phone]

## 2019-11-23 ENCOUNTER — Encounter (HOSPITAL_BASED_OUTPATIENT_CLINIC_OR_DEPARTMENT_OTHER): Payer: Medicare PPO | Attending: Physician Assistant | Admitting: Physician Assistant

## 2019-11-23 ENCOUNTER — Other Ambulatory Visit: Payer: Self-pay

## 2019-11-23 DIAGNOSIS — K449 Diaphragmatic hernia without obstruction or gangrene: Secondary | ICD-10-CM | POA: Insufficient documentation

## 2019-11-23 DIAGNOSIS — Z6841 Body Mass Index (BMI) 40.0 and over, adult: Secondary | ICD-10-CM | POA: Insufficient documentation

## 2019-11-23 DIAGNOSIS — L97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 leg with fat layer exposed: Secondary | ICD-10-CM | POA: Insufficient documentation

## 2019-11-23 DIAGNOSIS — I872 Venous insufficiency (chronic) (peripheral): Secondary | ICD-10-CM | POA: Diagnosis not present

## 2019-11-23 DIAGNOSIS — F329 Major depressive disorder, single episode, unspecified: Secondary | ICD-10-CM | POA: Diagnosis not present

## 2019-11-23 DIAGNOSIS — I10 Essential (primary) hypertension: Secondary | ICD-10-CM | POA: Diagnosis not present

## 2019-11-23 DIAGNOSIS — I69351 Hemiplegia and hemiparesis following cerebral infarction affecting right dominant side: Secondary | ICD-10-CM | POA: Insufficient documentation

## 2019-11-23 DIAGNOSIS — Z9119 Patient's noncompliance with other medical treatment and regimen: Secondary | ICD-10-CM | POA: Insufficient documentation

## 2019-11-23 DIAGNOSIS — E11622 Type 2 diabetes mellitus with other skin ulcer: Secondary | ICD-10-CM | POA: Diagnosis not present

## 2019-11-23 DIAGNOSIS — M5432 Sciatica, left side: Secondary | ICD-10-CM | POA: Diagnosis not present

## 2019-11-23 DIAGNOSIS — J45909 Unspecified asthma, uncomplicated: Secondary | ICD-10-CM | POA: Diagnosis not present

## 2019-11-23 DIAGNOSIS — L97822 Non-pressure chronic ulcer of other part of left lower leg with fat layer exposed: Secondary | ICD-10-CM | POA: Insufficient documentation

## 2019-11-23 DIAGNOSIS — M109 Gout, unspecified: Secondary | ICD-10-CM | POA: Diagnosis not present

## 2019-11-23 DIAGNOSIS — M1990 Unspecified osteoarthritis, unspecified site: Secondary | ICD-10-CM | POA: Diagnosis not present

## 2019-11-23 DIAGNOSIS — L97522 Non-pressure chronic ulcer of other part of left foot with fat layer exposed: Secondary | ICD-10-CM | POA: Insufficient documentation

## 2019-11-23 DIAGNOSIS — I87311 Chronic venous hypertension (idiopathic) with ulcer of right lower extremity: Secondary | ICD-10-CM | POA: Insufficient documentation

## 2019-11-23 DIAGNOSIS — I890 Lymphedema, not elsewhere classified: Secondary | ICD-10-CM | POA: Diagnosis not present

## 2019-11-23 DIAGNOSIS — E1140 Type 2 diabetes mellitus with diabetic neuropathy, unspecified: Secondary | ICD-10-CM | POA: Insufficient documentation

## 2019-11-23 DIAGNOSIS — E1151 Type 2 diabetes mellitus with diabetic peripheral angiopathy without gangrene: Secondary | ICD-10-CM | POA: Diagnosis not present

## 2019-11-23 NOTE — Progress Notes (Addendum)
[name], [name] ([id number]) Visit Report for 11/23/2019 Chief Complaint Document Details Patient Name: Date of Service: [name], [name] 11/23/2019 10:00 A M Medical Record Number: [id number] Patient Account Number: [id number] Date of Birth/Sex: Treating RN: [birth year] (69 y.o. [name] Primary Care Provider: [name] Other Clinician: Referring Provider: Treating Provider/Extender: Doyle Askew, FRED Weeks in Treatment: 120 Information Obtained from: Patient Chief Complaint Bilateral reoccurring LE ulcers Electronic Signature(s) Signed: 11/23/2019 10:04:18 AM By: [name] PA-C Entered By: [name] on 11/23/2019 10:04:17 -------------------------------------------------------------------------------- Debridement Details Patient Name: Date of Service: [name]. 11/23/2019 10:00 A M Medical Record Number: [id number] Patient Account Number: [id number] Date of Birth/Sex: Treating RN: 1951-05-21 (69 y.o. [name] Primary Care Provider: [name] Other Clinician: Referring Provider: Treating Provider/Extender: Doyle Askew, FRED Weeks in Treatment: 120 Debridement Performed for Assessment: Wound #61 Left,Circumferential Lower Leg Performed By: Physician [name], PA Debridement Type: Debridement Severity of Tissue Pre Debridement: Fat layer exposed Level of Consciousness (Pre-procedure): Awake and Alert Pre-procedure Verification/Time Out Yes - 10:30 Taken: Start Time: 10:30 T Area Debrided (L x W): otal 3.5 (cm) x 4 (cm) = 14 (cm) Tissue and other material debrided: Viable, Non-Viable, Slough, Subcutaneous, Slough Level: Skin/Subcutaneous Tissue Debridement Description: Excisional Instrument: Curette Bleeding: Minimum Hemostasis Achieved: Pressure End Time: 10:34 Procedural Pain: 0 Post Procedural Pain: 0 Response to Treatment: Procedure was tolerated well Level of Consciousness (Post- Awake and  Alert procedure): Post Debridement Measurements of Total Wound Length: (cm) 17.5 Width: (cm) 28 Depth: (cm) 0.2 Volume: (cm) 76.969 Character of Wound/Ulcer Post Debridement: Improved Severity of Tissue Post Debridement: Fat layer exposed Post Procedure Diagnosis Same as Pre-procedure Electronic Signature(s) Signed: 11/23/2019 6:08:32 PM By: [name] RN, BSN Signed: 11/23/2019 6:29:31 PM By: [name] PA-C Entered By: [name] on 11/23/2019 10:36:42 -------------------------------------------------------------------------------- HPI Details Patient Name: Date of Service: [name]. 11/23/2019 10:00 A M Medical Record Number: [id number] Patient Account Number: [id number] Date of Birth/Sex: Treating RN: [birth year] (69 y.o. [name] Primary Care Provider: [name] Other Clinician: Referring Provider: Treating Provider/Extender: Doyle Askew, FRED Weeks in Treatment: 120 History of Present Illness HPI Description: this patient has been seen a couple of times before and returns with recurrent problems to her right and left lower extremity with swelling and weeping ulcerations due to not wearing her compression stockings which she had been advised to do during her last discharge, at the end of June 2018. During her last visit the patient had had normal arterial blood flow and her venous reflux study did not necessitate any surgical intervention. She was recommended compression and elevation and wound care. After prolonged treatment the patient was completely healed but she has been noncompliant with wearing or compressions.. She was here last week with an outpatient return visit planned but the patient came in a very poor general condition with altered mental status and was rushed to the ER on my request. With a history of hypertension, diabetes, TIA and right-sided weakness she was set up for an MRI on her brain and cervical spine and was sent  to [facility]. Getting an MRI done was very difficult but once the workup was done she was found not to have any spinal stenosis, epidural abscess or hematoma or discitis. This was radiculopathy to be treated as an outpatient and she was given a follow-up appointment. Today she is feeling much better alert and oriented and has come to  reevaluate her bilateral lower extremity lymphedema and ulceration 03/25/2017 -- she was admitted to the hospital on 03/16/2017 and discharged on 03/18/2017 with left leg cellulitis and ulceration. She was started on vancomycin and Zosyn and x-ray showed no bony involvement. She was treated for a cellulitis with IV antibiotics changed to Rocephin and Flagyl and was discharged on oral Keflex and doxycycline to complete a 7 day course. Last hemoglobin A1c was 7.1 and her other ailments including hypertension got asthma were appropriately treated. 05/06/2017 -- she is awaiting the right size of compression stockings from [facility] but other than that has been doing well. ====== Old notes 69 year old patient was seen one time last October and was lost to follow-up. She has recurrent problems with weeping and ulceration of her left lower extremity and has swelling of this for several years. It has been worse for the last 2 months. Past medical history is significant for diabetes mellitus type 2, hypertension, gout, morbid obesity, depressive disorders, hiatal hernia, migraines, status post knee surgery, risk of a cholecystectomy, vaginal hysterectomy and breast biopsy. She is not a smoker. As noted before she has never had a venous duplex study and an arterial ABI study was attempted but the left lower extremity was noncompressible 10/01/2016 -- had a lower extremity venous duplex reflux evaluation which showed no evidence of deep vein reflux in the right or left lower extremity, and no evidence of great saphenous vein reflux more than 500 ms in the right or left lower  extremity, and the left small saphenous vein is incompetent but no vascular consult was recommended. review of her electronic medical records noted that the ABI was checked in July 2017 where the right ABI was normal limits and the left ABI could not be ascertained due to pain with cuff pressure but the waveforms are within normal limits. her arterial duplex study scheduled for April 27. 10/08/2016 -- the patient has various reasons for not having a compression on and for the last 3 days she has had no compression on her left lower extremity either due to pain or the lack of nursing help. She does not use her juxta lites either. 10/15/2016 -- the patient did not keep her appointment for arterial duplex study on April 27 and I have asked her to reschedule this. Her pain is out of proportion with the physical findings and she continuously fails to wear a compression wraps and cuts them off because she says she cannot tolerate the pain. She does not use her juxta lites either. 10/22/2016 -- he has rescheduled her arterial duplex study to May 21 and her pain today is a bit better. She has not been wearing her juxta lites on her right lower extremity but now understands that she needs to do this. She did tolerate the to press compression wrap on her left lower extremity 10/29/2016 --arterial duplex study is scheduled for next week and overall she has been tolerating her compression wraps and also using her juxta lites on her right lower extremity 11/05/2016 -- the right ABI was 0.95 the left was 1.03. The digit TBI is on the right was 0.83 on the left was 0.92 and she had biphasic flow through these vessels. The impression was that of normal lower extremity arterial study. 11/12/2016 -- her pain is minimal and she is doing very well overall. 11/26/2016 -- she has got juxta lites and her insurance will not pay for additional dual layer compression stockings. She is going to order some from  [employer]. 05/12/2017 -- her juxta lites are very old and too big for her and these have not been helping with compression. She did get 20-30 mm compression stockings from [facility] but she and her husband are unable to put these on. I believe she will benefit from bilateral Extremit-ease, compression stockings and we will measure her for these today. 05/20/2017 -- lymphedema on the left lower extremity has increased a lot and she has a open ulceration as a result of this. The right lower extremity is looking pretty good. She has decided to by the compression stockings herself and will get reimbursed by the home health, at a later date. 05/27/2017 -- her sciatica is bothering her a lot and she thought her left leg pain was caused due to the compression wrap and hence removed it and has significant lymphedema. There is no inflammation on this left lower extremity. 06/17/17 on evaluation today patient appears to be doing very well and in fact is completely healed in regard to her ulcerations. Unfortunately however she does have continued issues with lymphedema nonetheless. We did order compression garments for her unfortunately she states that the size that she received were large although we ordered medium. Obviously this means she is not getting the optimal compression. She does not have those with her today and therefore we could not confirm and contact the company on her behalf. Nonetheless she does state that she is going to have her husband bring them by tomorrow so that we can verify and then get in touch with the company. No fevers, chills, nausea, or vomiting noted at this time. Overall patient is doing better otherwise and I'm pleased with the progress she has made. 07/01/17 on evaluation today patient appears to be doing very well in regard to her bilateral lower extremity she does not have any openings at this point which is excellent news. Overall I'm pleased with how things have progressed up  to this time. Since she is doing so well we did order her compression which we are seeing her today to ensure that it fits her properly and everything is doing well in that regard and then subsequently she will be discharged. ============ Old Notes: 03/31/16 patient presents today for evaluation concerning open wounds that she has over the left medial ankle region as well as the left dorsal foot. She has previously had this occur although it has been healed for a number of months after having this for about a year prior until her hospitalization on 01/05/16. At that point in time it appears that she was admitted to the hospital for left lower extremity cellulitis and was placed on vancomycin and Zosyn at that point. Eventually upon discharge on January 15, 2016 she was placed on doxycycline at that point in time. Later on 03/27/16 positive wound culture growing Escherichia coli this was switched to amoxicillin. Currently she tells me that she is having pain radiated to be a 7 out of 10 which can be as high as 10 out of 10 with palpation and manipulation of the wound. This wound appears to be mainly venous in nature due to the bilateral lower extremity venous stasis/lymphedema. This is definitely much worse on her left than the right side. She does have type 1 diabetes mellitus, hypertension, morbid obesity, and is wheelchair dependent.during the course of the hospital stay a blood culture was also obtained and fortunately appeared negative. She also had an x-ray of the tibia/fibula on the left which showed no acute bone  abnormality. Her white blood cell count which was performed last on 03/25/16 was 7.3, hemoglobin 12.8, protein 7.1, albumin 3.0. Her urine culture appeared to be negative for any specific organisms. Patient did have a left lower extremity venous duplex evaluation for DVT . This did not include venous reflux studies but fortunately was negative for DVT Patient also had arterial studies  performed which revealed that she had a . normal ABI on the right though this was unable to be performed on the left secondary to pain that she was having around the ankle region due to the wound. However it was stated on report that she had biphasic pulses and apparently good blood flow. ========== 06/03/17 she is here in follow-up evaluation for right lower extremity ulcer. The right lower sure he has healed but she has reopened to the left medial malleolus and dorsal foot with weeping. She is waiting for new compression garments to arrive from home health, the previous compression garments were ill fitting. We will continue with compression bilaterally and follow-up in 2 weeks Readmission: 08/05/17 on evaluation today patient appears to be doing somewhat poorly in regard to her left lower extremity especially although the right lower extremity has a small area which may no longer be open. She has been having a lot of drainage from the left lower extremity however he tells me that she has not been able to use the EXTREMIT-EASE Compression at this point. She states that she did better and was able to actually apply the Juxta-Lite compression although the wound that she has is too large and therefore really does not compress which is why she cannot wear it at this point. She has no one who can help her put it on regular basis her son can sometimes but he's not able to do it most of the time. I do believe that's why she has begun to weave and have issues as she is currently yet again. No fevers, chills, nausea, or vomiting noted at this time. Patient is no evidence of dementia. 08/12/17 on evaluation today patient appears to still be doing fairly well in regard to the draining areas/weeping areas at this point. With that being said she unfortunately did go to the ER yesterday due to what was felt to be possibly a cellulitis. They place her on doxycycline by mouth and discharge her home. She definitely  was not admitted. With that being said she states she has had more discomfort which has been unusual for her even compared to prior times and she's had infections.08/12/17 on evaluation today patient appears to still be doing fairly well in regard to the draining areas/weeping areas at this point. With that being said she unfortunately did go to the ER yesterday due to what was felt to be possibly a cellulitis. They place her on doxycycline by mouth and discharge her home. She definitely was not admitted. With that being said she states she has had more discomfort which has been unusual for her even compared to prior times and she's had infections. 08/19/17 put evaluation today patient tells me that she's been having a lot of what sounds to be neuropathic type pain in regard to her left lower extremity. She has been using over-the-counter topical bins again which some believe. That in order to apply the she actually remove the wrap we put on her last Wednesday on Thursday. Subsequently she has not had anything on compression wise since that time. The good news is a lot of the  weeping areas appear to have closed at this point again I believe she would do better with compression but we are struggling to get her to actually use what she needs to at this point. No fevers, chills, nausea, or vomiting noted at this time. 09/03/17 on evaluation today patient appears to be doing okay in regard to her lower extremities in regard to the lymphedema and weeping. Fortunately she does not seem to show any signs of infection at this point she does have a little bit of weeping occurring in the right medial malleolus area. With that being said this does not appear to be too significant which is good news. 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 She has severe skin damage secondary to both of these features involving the dorsal left foot and medial  left ankle and lower leg. Still has open areas in the left anterior foot. The area on the right closed over. She uses her own juxta light stockings. She does not have an arterial issue 09/16/17 on evaluation today patient actually appears to be doing excellent in regard to her bilateral lower extremity swelling. The Juxta-Lite compression wrap seem to be doing very well for her. She has not however been using the portion that goes over her foot. Her left foot still is draining a little bit not nearly as significant as it has been in the past but still I do believe that she likely needs to utilize the full wrap including the foot portion of this will improve as well. She also has been apparently putting on a significant amount of Vaseline which also think is not helpful for her. I recommended that if she feels she needs something for moisturizer Eucerin will probably be better. 09/30/17 on evaluation today patient presents with several new open areas in regard to her left lower extremity although these appear to be minimal and mainly seem to be more moisture breakdown than anything. Fortunately she does not seem to have any evidence of infection which is great news. She has been tolerating the dressing changes without complication we are using silver alginate on the foot she has been using AB pads to have the legs and using her Juxta- Lite compression which seems to be controlling her swelling very well. Overall I'm pleased with the poor way she has progressed. 10/14/17 on evaluation today patient appears to be doing better in regard to her left lower extremity areas of weeping. She does still have some discomfort although in general this does not appear to be as macerated and I think it is progressing nicely. I do think she still needs to wear the foot portion of her Juxta- Lite in order to get the most benefit from the wrap obviously. She states she understands. Fortunately there does not appear to be  evidence of infection at this time which is great news. 10/28/17 on evaluation today patient appears to be doing excellent in regard to her left lower extremity. She has just a couple areas that are still open and seem to be causing any trouble whatsoever. For that reason I think that she is definitely headed in the right direction the spots are very tiny compared to what we have been dealing with in the past. 11/11/17 on evaluation today patient appears to have a right lateral lower extremity ulcer that has opened since I last saw her. She states this is where the home health nurse that was coming out remove  the dressing without wetting the alginate first. Nonetheless I do not know if this is indeed the case or not but more importantly we have not ordered home help to be coming out for her wounds at all. I'm unsure as to why they are coming out and we're gonna have to check on this and get things situated in that regard. With that being said we currently really do not need them to be coming out as the patient has been taking care of her leg herself without complication and no issues. In fact she was doing much better prior to nursing coming out. 11/25/17 on evaluation today patient actually appears to be doing fairly well in regard to her left lower extremity swelling. In fact she has very little area of weeping at this point there's just a small spot on the lateral portion of her right leg that still has me just a little bit more concerned as far as wanting to see this clear up before I discharge her to caring for this at home. Nonetheless overall she has made excellent progress. 12/09/17 on evaluation today patient appears to be doing rather well in regard to her lower extremity edema. She does have some weeping still in the left lower extremity although the big area we were taking care of two weeks ago actually has closed and she has another area of weeping on the left lower extremity immediately as  well is the top of her foot. She does not currently have lymphedema pumps she has been wearing her compression daily on a regular basis as directed. With that being said I think she may benefit from lymphedema pumps. 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 We had a very difficult time getting and keeping this under control. 12/23/17 on evaluation today patient actually appears to be doing a little bit more poorly in regard to her bilateral lower extremities. She has been tolerating the Juxta-Lite compression wraps. Unfortunately she has two new ulcers on the right lower extremity and left lower Trinity ulceration seems to be larger. Obviously this is not good news. She has been tolerating the dressings without complication. 12/30/17 on evaluation today patient actually appears to be doing much better in regard to her bilateral lower extremity edema. She continues to have some issues with ulcerations and in fact there appears to be one spot on each leg where the wrap may have caused a little bit of a blister which is subsequently opened up at this point is given her pain. Fortunately it does not appear to be any evidence of infection which is good news. No fevers chills noted. 01/13/18 on evaluation today patient appears to be doing rather well in regard to her bilateral lower extremities. The dressings did get kind of stuck as far as the wound beds are concerned but again I think this is mainly due to the fact that she actually seems to be showing signs of healing which is good news. She's not having as much drainage therefore she was having more of the dressing sticking. Nonetheless overall I feel like her swelling is dramatically down compared to previous. 01/20/18 on evaluation today patient unfortunately though she's doing better in most regards has  a large blister on the left anterior lower extremity where she is draining quite significantly. Subsequently this is going to need debridement  today in order to see what's underneath and ensure she does not continue to trapping fluid at this location. Nonetheless No fevers, chills, nausea, or vomiting noted at this time. 01/27/18 on evaluation today patient appears to be doing rather well at this point in regard to her right lower extremity there's just a very small area that she still has open at this point. With that being said I do believe that she is tolerating the compression wraps very well in making good progress. Home health is coming out at this point to see her. Her left lower extremity on the lateral portion is actually what still mainly open and causing her some discomfort for the most part 02/10/18 on evaluation today patient actually appears to be doing very well in regard to her right lower extremity were all the ulcers appear to be completely close. In regard to the left lower extremity she does have two areas still open and some leaking from the dorsal surface of her foot but this still seems to be doing much better to me in general. 02/24/18 on evaluation today patient actually appears to be doing much better in regard to her right lower extremity this is still completely healed. Her left lower extremity is also doing much better fortunately she has no evidence of infection. The one area that is gonna require some debridement is still on the left anterior shin. Fortunately this is not hurting her as badly today. 03/10/18 on evaluation today patient appears to be doing better in some regards although she has a little bit more open area on the dorsal foot and she also has some issues on the medial portion of the left lower extremity which is actually new and somewhat deep. With that being said there fortunately does not appear to be any significant signs of infection which is good news. No  fevers, chills, nausea, or vomiting noted at this time. In general her swelling seems to be doing fairly well which is good news. 03/31/18 on evaluation today patient presents for follow-up concerning her left lower extremity lymphedema. Unfortunately she has been doing a little bit more poorly since I last saw her in regard to the amount of weeping that she is experiencing. She's also having some increased pain in the anterior shin location. Unfortunately I do not feel like the patient is making such good progress at this point a few weeks back she was definitely doing much better. 04/07/18 on evaluation today patient actually appears to be showing some signs of improvement as far as the left lower extremity is concerned. She has been tolerating the dressing changes and it does appear that the Drawtex did better for her. With that being said unfortunately home health is stating that they cannot obtain the Drawtex going forward. Nonetheless we're gonna have to check and see what they may be able to get the alginate they were using was getting stuck in causing new areas of skin being pulled all that with and subsequently weep and calls her to worsen overall this is the first time we've seen improvement at this time. 04/14/18 on evaluation today patient actually appears to be doing rather well at this point there does not appear to be any evidence of infection at this time and she is actually doing excellent in regard to the weeping in fact she almost has no openings remaining even compared to just last week this is a dramatic improvement. No fevers chills noted 04/21/18 evaluation today patient actually appears to  be doing very well. She in fact is has a small area on the posterior lower extremity location and she has a small area on the dorsal surface of her foot that are still open both of which are very close to closing. We're hoping this will be close shortly. She brought her Juxta-Lite wrap with  her today hoping that would be able to put her in it unfortunately I don't think were quite at that point yet but we're getting closer. 04/28/18 upon evaluation today patient actually appears to be doing excellent in regard to her left lower extremity ulcer. In fact the region on the posterior lower extremity actually is much smaller than previously noted. Overall I'm very happy with the progress she has made. She again did bring her Juxta-Lite although we're not quite ready for that yet. 05/11/18 upon evaluation today patient actually appears to be doing in general fairly well in regard to her left lower Trinity. The swelling is very well controlled. With that being said she has a new area on the left anterior lower extremity as well as between the first and second toes of her left foot that was not present during the last evaluation. The region of her posterior left lower extremity actually appears to be almost completely healed. T be honest I'm very pleased with o the way that stands. Nonetheless I do believe that the lotion may be keeping the area to moist as far as her legs are concerned subsequently I'm gonna consider discontinuing that today. 05/26/18 on evaluation today patient appears to be doing rather well in regard to her left lower should be ulcers. In fact everything appears to be close except for a very small area on the left posterior lower extremity. Fortunately there does not appear to be any evidence of infection at this time. Overall very pleased with her progress. 06/02/18 and evaluation today patient actually appears to be doing very well in regard to her lower extremity ulcers. She has one small area that still continues to weep that I think may benefit her being able to justify lotion and user Juxta-Lite wraps versus continued to wrap her. Nonetheless I think this is something we can definitely look into at this point. 06/23/18 on evaluation today patient unfortunately has  openings of her bilateral lower extremities. In general she seems to be doing much worse than when I last saw her just as far as her overall health standpoint is concerned. She states that her discomfort is mainly due to neuropathy she's not having any other issues otherwise. No fevers, chills, nausea, or vomiting noted at this time. 06/30/18 on evaluation today patient actually appears to be doing a little worse in regard to her right lower extremity her left lower extremity of doing fairly well. Fortunately there is no sign of infection at this time. She has been tolerating the dressing changes without complication. Home health did not come out like they were supposed to for the appropriate wrap changes. They stated that they never received the orders from [place] which were fax. Nonetheless we will send a copy of the orders with the patient today as well. 07/07/18 on evaluation today patient appears to be doing much better in regard to lower extremities. She still has several openings bilaterally although since I last saw her her legs did show obvious signs of infection when she later saw her nurse. Subsequently a culture was obtained and she is been placed on Bactrim and Keflex. Fortunately things seem to be  looking much better it does appear she likely had an infection. Again last week we'd even discussed it but again there really was not any obvious sign that she had infection therefore we held off on the antibiotics. Nonetheless I'm glad she's doing better today. 07/14/18 on evaluation today patient appears to be doing much better regarding her bilateral lower Trinity's. In fact on the right lower for me there's nothing open at this point there are some dry skin areas at the sites where she had infection. Fortunately there is no evidence of systemic infection which is excellent news. No fevers chills noted 07/21/18 on evaluation today patient actually appears to be doing much better in regard to her left  lower extremity ulcers. She is making good progress and overall I feel like she's improving each time I see her. She's having no pain I do feel like the infection is completely resolved which is excellent news. No fevers, chills, nausea, or vomiting noted at this time. 07/28/18 on evaluation today patient appears to be doing very well in regard to her left lower [employer]. Everything seems to be showing signs of improvement which is excellent news. Overall very pleased with the progress that has been made. Fortunately there's no evidence of active infection at this time also excellent news. 08/04/18 on evaluation today patient appears to be doing more poorly in regard to her bilateral lower extremities. She has two new areas open up on the right and these were completely closed as of last week. She still has the two spots on the left which in my pinion seem to be doing better. Fortunately there's no evidence of infection again at this point. 08/11/18 on evaluation today patient actually appears to be doing very well in regard to her bilateral lower Trinity wounds that all seem to be doing better and are measures smaller today. Fortunately there's no signs of infection. No fevers, chills, nausea, or vomiting noted at this time. 08/18/18 on evaluation today patient actually appears to be doing about the same inverter bilateral lower extremities. She continues to have areas that blistering open as was drain that fortunately nothing too significant. Overall I feel like Drawtex may have done better for her however compared to the collagen. 08/25/18 on evaluation today patient appears to be doing a little bit more poorly today even compared to last time I saw her. Again I'm not exactly sure why she's making worse progress over the past several weeks. I'm beginning to wonder if there is some kind of underlying low level infection causing this issue. I did actually take a culture from the left anterior lower  extremity but it was a new wound draining quite a bit at this point. Unfortunately she also seems to be having more pain which is what also makes me worried about the possibility of infection. This is despite never erythema noted at this point. 09/01/18 on evaluation today patient actually appears to be doing a little worse even compared to last week in regard to bilateral lower extremities. She did go to the hospital on the 16th was given a dose of IV Zosyn and then discharged with a recommendation to continue with the Bactrim that I previously prescribed for her. Nonetheless she is still having a lot of discomfort she tells me as well at this time. This is definitely unfortunate. No fevers, chills, nausea, or vomiting noted at this time. 09/08/18 on evaluation today patient's bilateral lower extremities actually appear to be shown signs of improvement  which is good news. Fortunately there does not appear to be any signs of active infection I think the anabiotic is helping in this regard. Overall I'm very pleased with how she is progressing. 09/15/18 patient was actually seen in ER yesterday due to her legs as well unfortunately. She states she's been having a lot of pain and discomfort as well as a lot of drainage. Upon inspection today the patient does have a lot of swelling and drainage I feel like this is more related to lymphedema and poor fluid control than it is to infection based on what I'm seeing. The physician in the emergency department also doubted that the patient was having a significant infection nonetheless I see no evidence of infection obvious at this point although I do see evidence of poor fluid control. She still not using a compression pumps, she is not elevating due to her lift chair as well as her hospital bed being broken, and she really is not keeping her legs up as much as they should be and also has been taking off her wraps. All this combined I think has led to poor fluid  control and to be honest she may be somewhat volume overloaded in general as well. I recommend that she may need to contact your physician to see if a prescription for a diuretic would be beneficial in their opinion. As long as this is safe I think it would likely help her. 09/29/18 on evaluation today patient's left lower extremity actually appears to be doing quite a bit better. At least compared to last time that I saw her. She still has a large area where she is draining from but there's a lot of new skin speckled trout and in fact there's more new skin that there are open areas of weeping and drainage at this point. This is good news. With regard to the right lower extremity this is doing much better with the only open area that I really see being a dry spot on the right lateral ankle currently. Fortunately there's no signs of active infection at this time which is good news. No fevers, chills, nausea, or vomiting noted at this time. The patient seems somewhat stressed and overwhelmed during the visit today she was very lethargic as such. She does and she is not taking any pain medications at this point. Apparently according to her husband are also in the process of moving which is probably taking its toll on her as well. 10/06/18 on evaluation today patient appears to be doing rather well in regard to her lower extremities compared to last evaluation. Fortunately there's no signs of active infection. She tells me she did have an appointment with her primary. Nonetheless he was concerned that the wounds were somewhat deep based on pictures but we never actually saw her legs. She states that he had her somewhat worried due to the fact that she was fearing now that she was [place] have to have an amputation. With that being said based on what I'm seeing check she looks better this week that she has the last two times I've seen her with much less drainage I'm actually pleased in this regard. That doesn't  mean that she's out of the water but again I do not think what the point of talking about education at all in regard to her leg. She is very happy to hear this. She is also not having as much pain as she was having last week. 10/13/18 unfortunately on  evaluation today patient still continues to have a significant amount of drainage she's not letting home health actually apply the compression dressings at this point. She's trying to use of Juxta-Lite of the top of Kerlex and the second layer of the three layer compression wrap. With that being said she just does not seem to be making as good a progress as I would expect if she was having the compression applied and in place on a regular basis. No fevers, chills, nausea, or vomiting noted at this time. 10/20/18 on evaluation today patient appears to be doing a little better in regard to her bilateral lower extremity ulcers. In fact the right lower extremity seems to be healed she doesn't even have any openings at this point left lower extremity though still somewhat macerated seems to be showing signs of new skin growth at multiple locations throughout. Fortunately there's no evidence of active infection at this time. No fevers, chills, nausea, or vomiting noted at this time. 10/27/18 on evaluation today patient appears to be doing much better in regard to her left lower Trinity ulcer. She's been tolerating the laptop complication and has minimal drainage noted at this point. Fortunately there's no signs of active infection at this time. No fevers, chills, nausea, or vomiting noted at this time. 11/03/18 on evaluation today patient actually appears to be doing excellent in regard to her left lower extremity. She is having very little drainage at this point there does not appear to be any significant signs of infection overall very pleased with how things have gone. She is likewise extremely pleased still and seems to be making wonderful progress week to week.  I do believe antibiotics were helpful for her. Her primary care provider did place on amateur clean since I last saw her. 11/17/18 on evaluation today patient appears to be doing worse in regard to her bilateral lower extremities at this point. She is been tolerating the dressing changes without complication. With that being said she typically takes the Coban off fairly quickly upon arriving home even after being seen here in the clinic and does not allow home health reapply command as part of the dressing at home. Therefore she said no compression essentially since I last saw her as best I can tell. With that being said I think it shows and how much swelling she has in the open wounds that are noted at this point. Fortunately there's no signs of infection but unfortunately if she doesn't get this under control I think she will end up with infection and more significant issues. 11/24/18 on evaluation today patient actually appears to be doing somewhat better in regard to her bilateral lower extremities. 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 Nonetheless that is doing better currently so she should be able to attempt reinitiating the lymphedema pumps at this time. No fevers, chills, nausea, or vomiting noted at this time. 12/01/18 upon evaluation today patient's left lower extremity appears to be doing quite well unfortunately her right lower extremity is not doing nearly as well. She has been tolerating the dressing changes without complication unfortunately she did not keep a wrap on the right at this time. Nonetheless I believe this has led to increased swelling and weeping in the world is actually much larger than during the last evaluation with  her. 12/08/18 on evaluation today patient appears to be doing about the same at this point in regard to her right  lower extremity. There is some more palatable to touch I'm concerned about the possibility of there being some infection although I think the main issue is she's not keeping her compression wrap on which in turn is not allowing this area to heal appropriately. 12/22/18 on evaluation today patient appears to be doing better in regard to left lower extremity unfortunately significantly worse in regard to the right lower extremity. The areas of blistering and necrotic superficial tissue have spread and again this does not really appear to be signs of infection and all she just doesn't seem to be doing nearly as well is what she has been in the past. Overall I feel like the Augmentin did absolutely nothing for her she doesn't seem to have any infection again I really didn't think so last time either is more of a potential preventative measure and hoping that this would make some difference but I think the main issue is she's not wearing her compression. She tells me she cannot wear the [place] been we put on she takes it off pretty much upon getting home. Subsequently she worshiped Juxta-Lite when I questioned her about how often she wears it this is no more than three hours a day obviously that leaves 21 hours that she has no compression and this is obviously not doing well for her. Overall I'm concerned that if things continue to worsen she is at great risk of both infection as well as losing her leg. 01/05/19 on evaluation today patient appears to be doing well in regard to her left lower extremity which he is allowing [place] to wrap and not so well with regard to her right lower extremity which she is not allowing [place] to really wrap and keep the wrap on. She states that it hurts too badly whenever it's wrapped and she ends up having to take it off. She's been using the Juxta-Lite she tells me up to six hours a day although I question whether or not that's really been the case to be honest. Previously she told  me three hours today nonetheless obviously the legs as long as the wrap is doing great when she is not is doing much more poorly. 01/12/2019 on evaluation today patient actually appears to be doing a little better in my opinion with regard to her right lower extremity ulcer. She has a small open area on the left lower extremity unfortunately but again this I think is part of the normal fluctuation of what she is going to have to expect with regard to her legs especially when she is not using her lymphedema pumps on a regular basis. Subsequently based on what I am seeing today I think that she does seem to be doing slightly better with regard to her right lower extremity she did see her primary care provider on Monday they felt she had an infection and placed her on 2 antibiotics. Both Cipro and clindamycin. Subsequently again she seems possibly to be doing a little bit better in regards to the right lower extremity she also tells me however she has been wearing the compression wrap over the past week since I spoke with her as well that is a Kerlix and Coban wrap on the right. No fevers, chills, nausea, vomiting, or diarrhea. 01/19/2019 on evaluation today patient appears to be doing better with regard to her bilateral lower extremities  especially the right. I feel like the compression has been beneficial for her which is great news. She did get a call from her primary care provider on her way here today telling her that she did have methicillin- resistant Staphylococcus aureus and he was calling in a couple new antibiotics for her including a ointment to be applied she tells me 3 times a day. With that being said this sounds like likely to be Bactroban which I think could be applied with each dressing/wrap change but I would not be able to accommodate her applying this 3 times a day. She is in agreement with the least doing this we will add that to her orders today. 01/26/2019 on evaluation today patient  actually appears to be doing much better with regard to her right lower extremity. Her left lower extremity is also doing quite well all things considering. Fortunately there is no evidence of active infection at this time. No fevers, chills, nausea, vomiting, or diarrhea. 02/02/2019 on evaluation today patient appears to be doing much better compared to her last evaluation. Little by little off like her right leg is returning more towards normal. There does not appear to be any signs of active infection and overall she seems to be doing quite well which is great news. I am very pleased in this regard. No fevers, chills, nausea, vomiting, or diarrhea. 02/09/2019 upon evaluation today patient appears to be doing better with regard to her bilateral lower extremities. She has been tolerating the dressing changes without complication. Fortunately there is no signs of active infection at this time. No fevers, chills, nausea, vomiting, or diarrhea. 02/23/2019 on evaluation today patient actually appears to be doing quite well with regard to her bilateral lower extremities. She has been tolerating the dressing changes without complication. She is even used her pumps one time and states that she really felt like it felt good. With that being said she seems to be in good spirits and her legs appear to be doing excellent. 03/09/2019 on evaluation today patient appears to be doing well with regard to her right lower extremity there are no open wounds at this time she is having some discomfort but I feel like this is more neuropathy than anything. With regard to her left lower extremity she had several areas scattered around that she does have some weeping and drainage from but again overall she does not appear to be having any significant issues and no evidence of infection at this time which is good news. 03/23/2019 on evaluation today patient appears to be doing well with regard to her right lower extremity which she  tells me is still close she is using her juxta light here. Her left lower extremity she mainly just has an area on the foot which is still slightly draining although this also is doing great. Overall very pleased at this time. 04/06/2019 patient appears to be doing a little bit worse in regard to her left lower extremity upon evaluation today. She feels like this could be becoming infected again which she had issues with previous. Fortunately there is no signs of systemic infection but again this is always a struggle with her with her legs she will go from doing well to not so well in a very short amount of time. 04/20/2019 on evaluation today patient actually appears to be doing quite well with regard to her right lower extremity I do not see any signs of active infection at this time. Fortunately there is  no fever chills noted. She is still taking the antibiotics which I prescribed for her at this point. In regard to the left lower extremity I do feel like some of these areas are better although again she still is having weeping from several locations at this time. 04/27/2019 on evaluation today patient appears to be doing about the same if not slightly worse in regard to her left lower extremity ulcers. She tells me when questioned that she has been sleeping in her Hoveround chair in fact she tells me she falls asleep without even knowing it. I think she is spending a whole lot of time in the chair and less time walking and moving around which is not good for her legs either. On top of that she is in a seated position which is also the worst position she is not really elevating her legs and she is also not using her lymphedema pumps. All this is good to contribute to worsening of her condition in general. 05/18/2019 on evaluation today patient appears to be doing well with regard to her lower extremity on the right in fact this is showing no signs of any open wounds at this time. On the left she is  continuing to have issues with areas that do drain. Some of the regions have healed and there are couple areas that have reopened. She did go to the ER per the patient according to recommendations from the home health nurse due to what she was seen when she came out on 05/13/2019. Subsequently she felt like the patient needed to go to the hospital due to the fact that again she was having "milky white discharge" from her leg. Nonetheless she had and then was placed on doxycycline and subsequently seems to be doing better. 06/01/2019 upon evaluation today patient appears to be doing really in my opinion about the same. I do not see any signs of active infection which is good news. Overall she still has wounds over the bilateral lower extremities she has reopened on the right but this appears to be more of a crack where there is weeping/edema coming from the region. I do not see any evidence of infection at either site based on what I visualized today. 07/13/2019 upon evaluation today patient appears to be doing a little worse compared to last time I saw her. She since has been in the hospital from 06/21/2019 through 06/29/2019. This was secondary to having Covid. During that time they did apply lotion to her legs which unfortunately has caused her to develop a myriad of open wounds on her lower extremities. Her legs do appear to be doing better as far as the overall appearance is concerned but nonetheless she does have more open and weeping areas. 07/27/2019 upon evaluation today patient appears to be doing more poorly to be honest in regard to her left lower extremity in particular. There is no signs of systemic infection although I do believe she may have local infection. She notes she has been having a lot of blue/green drainage which is consistent potentially with Pseudomonas. That may be something that we need to consider here as well. The doxycycline does not seem to have been helping. 08/03/2019  upon evaluation today patient appears to be doing a little better in my opinion compared to last week's evaluation. Her culture I did review today and she is on appropriate medications to help treat the Enterobacter that was noted. Overall I feel like that is good news. With that  being said she is unfortunately continuing to have a lot of drainage and though it is doing better I still think she has a long ways to go to get things dried up in general. Fortunately there is no signs of systemic infection. 08/10/2019 upon evaluation today patient appears to be doing may be slightly better in regard to her left lower extremity the right lower extremity is doing much better. Fortunately there is no signs of infection right now which is good news. No fevers, chills, nausea, vomiting, or diarrhea. 08/24/2019 on evaluation today patient appears to be doing slightly better in regard to her lower extremities. The left lower extremity seems to be healed the right lower extremity is doing better though not completely healed as far as the openings are concerned. She has some generalized issues here with edema and weeping secondary to her lymphedema though again I do believe this is little bit drier compared to prior weeks evaluations. In general I am very pleased with how things seem to be progressing. No fevers, chills, nausea, vomiting, or diarrhea. 08/31/2019 upon evaluation today patient actually seems to making some progress here with regard to the left lower extremity in particular. She has been tolerating the dressing changes without complication. Fortunately there is no signs of active infection at this time. No fevers, chills, nausea, vomiting, or diarrhea. She did see Dr. Doren Custard and he did note that she did have a issue with the left great saphenous vein and the small saphenous vein in the leg. With that being said he was concerned about the possibility of laser ablation not being extremely successful. He also  mentioned a small risk of DVT associated with the procedure. However if the wounds do not continue to improve he stated that that would probably be the way to go. Fortunately the patient's legs do seem to be doing much better. 09/07/2019 upon evaluation today patient appears to be doing better with regard to her lower extremities. She has been tolerating the dressing changes without complication. With that being said she is showing signs of improvement and overall very pleased. There are some areas on her leg that I think we do need to debride we discussed this last week the patient is in agreement with doing that as long as it does not hurt too badly. 09/14/2019 upon evaluation today patient appears to be doing decently well with regard to her left lower extremity. She is not having near as much weeping as she has had in the past things seem to be drying up which is good news. There is no signs of active infection at this time. 09/21/19 upon evaluation today patient appears to be doing better in regard overall to her bilateral lower extremities. She again has less open than she did previous and each week I feel like this is getting better. Fortunately there is no signs of active infection at this time. No fevers, chills, nausea, vomiting, or diarrhea. 09/28/2019 upon evaluation today patient actually appears to be showing signs of improvement with regard to her left lower extremity. Unfortunately the right medial lower extremity around the ankle region has reopened to some degree but this appears to be minimal still which is good news. There is no signs of active infection at this time which is also good news. 10/12/2019 upon evaluation today patient appears to be doing okay with regard to her bilateral lower extremities today. The right is a little bit worse then last evaluation 2 weeks ago. The left is  actually doing a little better in my opinion. Overall there is no signs of active infection at this  time that I see. Obviously that something we have to keep a close eye on she is very prone to this with the significant and multiple openings that she has over the bilateral lower extremities. 10/19/2019 upon evaluation today patient appears to be doing about the best that I have seen her in quite some time. She has been tolerating the dressing changes without complication. There does not appear to be any signs of active infection and overall I am extremely happy with the way her legs appeared. She is drying up quite nicely and overall is having less pain. 11/09/2019 upon evaluation today patient appears to be doing better in regard to her wounds. She seems to be drying up more and more each time I see her this is just taking a very long time. Fortunately there is no signs of active infection at this time. 11/23/2019 upon evaluation today patient actually appears to be doing excellent in regard to her lower extremities at this point compared to where she has been. Fortunately there is no signs of active infection at this time. She did go to the hospital last week for nausea and vomiting completely unrelated to her wounds. Fortunately she is doing better she was given some Reglan and got better. She had associated abdominal pain but they never found out what was going on. Electronic Signature(s) Signed: 11/23/2019 10:39:51 AM By: [name] PA-C Entered By: [name] on 11/23/2019 10:39:51 -------------------------------------------------------------------------------- Physical Exam Details Patient Name: Date of Service: [name], [name] 11/23/2019 10:00 A M Medical Record Number: [id number] Patient Account Number: [id number] Date of Birth/Sex: Treating RN: [birth year] (69 y.o. [name] Primary Care Provider: [name] Other Clinician: Referring Provider: Treating Provider/Extender: Doyle Askew, FRED Weeks in Treatment: 120 Constitutional Obese and well-hydrated in no  acute distress. Respiratory normal breathing without difficulty. Psychiatric this patient is able to make decisions and demonstrates good insight into disease process. Alert and Oriented x 3. pleasant and cooperative. Notes Upon inspection patient's wound bed actually showed some slough noted over the left lower extremity and several areas where she did require sharp debridement today. I think this will help allow the areas to heal more effectively and quickly. She tolerated debridement without any pain whatsoever and post debridement everything looks to be doing much better. Electronic Signature(s) Signed: 11/23/2019 10:40:20 AM By: [name] PA-C Entered By: [name] on 11/23/2019 10:40:20 -------------------------------------------------------------------------------- Physician Orders Details Patient Name: Date of Service: [name]. 11/23/2019 10:00 A M Medical Record Number: [id number] Patient Account Number: [id number] Date of Birth/Sex: Treating RN: 04-01-1951 (69 y.o. [name] Primary Care Provider: [name] Other Clinician: Referring Provider: Treating Provider/Extender: Doyle Askew, FRED Weeks in Treatment: [phone] Verbal / Phone Orders: No Diagnosis Coding ICD-10 Coding Code Description E11.622 Type 2 diabetes mellitus with other skin ulcer I89.0 Lymphedema, not elsewhere classified I87.331 Chronic venous hypertension (idiopathic) with ulcer and inflammation of right lower extremity I87.332 Chronic venous hypertension (idiopathic) with ulcer and inflammation of left lower extremity L97.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 leg with fat layer exposed L97.822 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 I10 Essential (primary) hypertension E66.01 Morbid (severe) obesity due to excess  calories F41.8 Other  specified anxiety disorders R53.1 Weakness Follow-up Appointments Return Appointment in 2 weeks. Dressing Change Frequency Wound #61 Left,Circumferential Lower Leg Change dressing three times week. Wound #64 Left,Dorsal Foot Change dressing three times week. Wound #65 Right,Medial Lower Leg Change dressing three times week. Skin Barriers/Peri-Wound Care Barrier cream - zinc oxide cream to any macerated areas Moisturizing lotion - to dry skin Wound Cleansing Clean wound with Wound Cleanser - all wounds May shower with protection. Primary Wound Dressing Wound #61 Left,Circumferential Lower Leg Calcium Alginate with Silver Wound #64 Left,Dorsal Foot Calcium Alginate with Silver Wound #65 Right,Medial Lower Leg Calcium Alginate with Silver Secondary Dressing Wound #61 Left,Circumferential Lower Leg Dry Gauze - all wounds ABD pad - as needed Wound #64 Left,Dorsal Foot Dry Gauze - all wounds ABD pad - as needed Wound #65 Right,Medial Lower Leg Dry Gauze Edema Control 3 Layer Compression System - Bilateral void standing for long periods of time - walking is encouraged A Elevate legs to the level of the heart or above for 30 minutes daily and/or when sitting, a frequency of: - do not sleep in chair with feet dangling, MUST elevate legs while sitting Exercise regularly Segmental Compressive Device. - lymphedema pumps 60 minutes 1- 2 times per day Off-Loading Turn and reposition every 2 hours Additional Orders / Instructions Follow Nutritious Diet - T include vitamin A, vitamin C, and Zinc along with increased protein intake. o [facility] for wound care. - [facility] Electronic Signature(s) Signed: 11/23/2019 6:08:32 PM By: [name] RN, BSN Signed: 11/23/2019 6:29:31 PM By: [name] PA-C Entered By: [name] on 11/23/2019  10:37:41 -------------------------------------------------------------------------------- Problem List Details Patient Name: Date of Service: [name]. 11/23/2019 10:00 A M Medical Record Number: [id number] Patient Account Number: [id number] Date of Birth/Sex: Treating RN: 10/13/1950 (69 y.o. [name] Primary Care Provider: [name] Other Clinician: Referring Provider: Treating Provider/Extender: Doyle Askew, FRED Weeks in Treatment: 120 Active Problems ICD-10 Encounter Code Description Active Date MDM Diagnosis E11.622 Type 2 diabetes mellitus with other skin ulcer 08/05/2017 No Yes I89.0 Lymphedema, not elsewhere classified 08/05/2017 No Yes I87.331 Chronic venous hypertension (idiopathic) with ulcer and inflammation of right 08/05/2017 No Yes lower extremity I87.332 Chronic venous hypertension (idiopathic) with ulcer and inflammation of left 08/05/2017 No Yes lower extremity L97.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 leg with fat layer 08/05/2017 No Yes exposed L97.822 Non-pressure chronic ulcer of other part of left lower leg with fat layer exposed2/20/2019 No Yes L97.522 Non-pressure chronic ulcer of other part of left foot with fat layer exposed 06/01/2019 No Yes I10 Essential (primary) hypertension 08/05/2017 No Yes E66.01 Morbid (severe) obesity due to excess calories 08/05/2017 No Yes F41.8 Other specified anxiety disorders 08/05/2017 No Yes R53.1 Weakness 08/05/2017 No Yes Inactive Problems Resolved Problems Electronic Signature(s) Signed: 11/23/2019 10:04:06 AM By: [name] PA-C Entered By: [name] on 11/23/2019 10:04:05 -------------------------------------------------------------------------------- Progress Note Details Patient Name: Date of Service: [name]. 11/23/2019 10:00 A M Medical Record Number: [id number] Patient Account Number: [id number] Date of Birth/Sex: Treating RN: July 05, 1950 (69 y.o.  [name] Primary Care Provider: [name] Other Clinician: Referring Provider: Treating Provider/Extender: Doyle Askew, FRED Weeks in Treatment: 120 Subjective Chief Complaint Information obtained from Patient Bilateral reoccurring LE ulcers History of Present Illness (HPI) this patient has been seen a couple of times before and returns with recurrent problems to her right and left lower extremity with  swelling and weeping ulcerations due to not wearing her compression stockings which she had been advised to do during her last discharge, at the end of June 2018. During her last visit the patient had had normal arterial blood flow and her venous reflux study did not necessitate any surgical intervention. She was recommended compression and elevation and wound care. After prolonged treatment the patient was completely healed but she has been noncompliant with wearing or compressions.. She was here last week with an outpatient return visit planned but the patient came in a very poor general condition with altered mental status and was rushed to the ER on my request. With a history of hypertension, diabetes, TIA and right-sided weakness she was set up for an MRI on her brain and cervical spine and was sent to [facility]. Getting an MRI done was very difficult but once the workup was done she was found not to have any spinal stenosis, epidural abscess or hematoma or discitis. This was radiculopathy to be treated as an outpatient and she was given a follow-up appointment. Today she is feeling much better alert and oriented and has come to reevaluate her bilateral lower extremity lymphedema and ulceration 03/25/2017 -- she was admitted to the hospital on 03/16/2017 and discharged on 03/18/2017 with left leg cellulitis and ulceration. She was started on vancomycin and Zosyn and x-ray showed no bony involvement. She was treated for a cellulitis with IV antibiotics changed to  Rocephin and Flagyl and was discharged on oral Keflex and doxycycline to complete a 7 day course. Last hemoglobin A1c was 7.1 and her other ailments including hypertension got asthma were appropriately treated. 05/06/2017 -- she is awaiting the right size of compression stockings from [facility] but other than that has been doing well. ====== Old notes 69 year old patient was seen one time last October and was lost to follow-up. She has recurrent problems with weeping and ulceration of her left lower extremity and has swelling of this for several years. It has been worse for the last 2 months. Past medical history is significant for diabetes mellitus type 2, hypertension, gout, morbid obesity, depressive disorders, hiatal hernia, migraines, status post knee surgery, risk of a cholecystectomy, vaginal hysterectomy and breast biopsy. She is not a smoker. As noted before she has never had a venous duplex study and an arterial ABI study was attempted but the left lower extremity was noncompressible 10/01/2016 -- had a lower extremity venous duplex reflux evaluation which showed no evidence of deep vein reflux in the right or left lower extremity, and no evidence of great saphenous vein reflux more than 500 ms in the right or left lower extremity, and the left small saphenous vein is incompetent but no vascular consult was recommended. review of her electronic medical records noted that the ABI was checked in July 2017 where the right ABI was normal limits and the left ABI could not be ascertained due to pain with cuff pressure but the waveforms are within normal limits. her arterial duplex study scheduled for April 27. 10/08/2016 -- the patient has various reasons for not having a compression on and for the last 3 days she has had no compression on her left lower extremity either due to pain or the lack of nursing help. She does not use her juxta lites either. 10/15/2016 -- the patient did not keep her  appointment for arterial duplex study on April 27 and I have asked her to reschedule this. Her pain is out of proportion with  the physical findings and she continuously fails to wear a compression wraps and cuts them off because she says she cannot tolerate the pain. She does not use her juxta lites either. 10/22/2016 -- he has rescheduled her arterial duplex study to May 21 and her pain today is a bit better. She has not been wearing her juxta lites on her right lower extremity but now understands that she needs to do this. She did tolerate the to press compression wrap on her left lower extremity 10/29/2016 --arterial duplex study is scheduled for next week and overall she has been tolerating her compression wraps and also using her juxta lites on her right lower extremity 11/05/2016 -- the right ABI was 0.95 the left was 1.03. The digit TBI is on the right was 0.83 on the left was 0.92 and she had biphasic flow through these vessels. The impression was that of normal lower extremity arterial study. 11/12/2016 -- her pain is minimal and she is doing very well overall. 11/26/2016 -- she has got juxta lites and her insurance will not pay for additional dual layer compression stockings. She is going to order some from [facility]. 05/12/2017 -- her juxta lites are very old and too big for her and these have not been helping with compression. She did get 20-30 mm compression stockings from [facility] but she and her husband are unable to put these on. I believe she will benefit from bilateral Extremit-ease, compression stockings and we will measure her for these today. 05/20/2017 -- lymphedema on the left lower extremity has increased a lot and she has a open ulceration as a result of this. The right lower extremity is looking pretty good. She has decided to by the compression stockings herself and will get reimbursed by the home health, at a later date. 05/27/2017 -- her sciatica is bothering her a lot and  she thought her left leg pain was caused due to the compression wrap and hence removed it and has significant lymphedema. There is no inflammation on this left lower extremity. 06/17/17 on evaluation today patient appears to be doing very well and in fact is completely healed in regard to her ulcerations. Unfortunately however she does have continued issues with lymphedema nonetheless. We did order compression garments for her unfortunately she states that the size that she received were large although we ordered medium. Obviously this means she is not getting the optimal compression. She does not have those with her today and therefore we could not confirm and contact the company on her behalf. Nonetheless she does state that she is going to have her husband bring them by tomorrow so that we can verify and then get in touch with the company. No fevers, chills, nausea, or vomiting noted at this time. Overall patient is doing better otherwise and I'm pleased with the progress she has made. 07/01/17 on evaluation today patient appears to be doing very well in regard to her bilateral lower extremity she does not have any openings at this point which is excellent news. Overall I'm pleased with how things have progressed up to this time. Since she is doing so well we did order her compression which we are seeing her today to ensure that it fits her properly and everything is doing well in that regard and then subsequently she will be discharged. ============ Old Notes: 03/31/16 patient presents today for evaluation concerning open wounds that she has over the left medial ankle region as well as the left dorsal foot.  She has previously had this occur although it has been healed for a number of months after having this for about a year prior until her hospitalization on 01/05/16. At that point in time it appears that she was admitted to the hospital for left lower extremity cellulitis and was placed on  vancomycin and Zosyn at that point. Eventually upon discharge on January 15, 2016 she was placed on doxycycline at that point in time. Later on 03/27/16 positive wound culture growing Escherichia coli this was switched to amoxicillin. Currently she tells me that she is having pain radiated to be a 7 out of 10 which can be as high as 10 out of 10 with palpation and manipulation of the wound. This wound appears to be mainly venous in nature due to the bilateral lower extremity venous stasis/lymphedema. This is definitely much worse on her left than the right side. She does have type 1 diabetes mellitus, hypertension, morbid obesity, and is wheelchair dependent.during the course of the hospital stay a blood culture was also obtained and fortunately appeared negative. She also had an x-ray of the tibia/fibula on the left which showed no acute bone abnormality. Her white blood cell count which was performed last on 03/25/16 was 7.3, hemoglobin 12.8, protein 7.1, albumin 3.0. Her urine culture appeared to be negative for any specific organisms. Patient did have a left lower extremity venous duplex evaluation for DVT . This did not include venous reflux studies but fortunately was negative for DVT Patient also had arterial studies performed which revealed that she had a . normal ABI on the right though this was unable to be performed on the left secondary to pain that she was having around the ankle region due to the wound. However it was stated on report that she had biphasic pulses and apparently good blood flow. ========== 06/03/17 she is here in follow-up evaluation for right lower extremity ulcer. The right lower sure he has healed but she has reopened to the left medial malleolus and dorsal foot with weeping. She is waiting for new compression garments to arrive from home health, the previous compression garments were ill fitting. We will continue with compression bilaterally and follow-up in 2  weeks Readmission: 08/05/17 on evaluation today patient appears to be doing somewhat poorly in regard to her left lower extremity especially although the right lower extremity has a small area which may no longer be open. She has been having a lot of drainage from the left lower extremity however he tells me that she has not been able to use the EXTREMIT-EASE Compression at this point. She states that she did better and was able to actually apply the Juxta-Lite compression although the wound that she has is too large and therefore really does not compress which is why she cannot wear it at this point. She has no one who can help her put it on regular basis her son can sometimes but he's not able to do it most of the time. I do believe that's why she has begun to weave and have issues as she is currently yet again. No fevers, chills, nausea, or vomiting noted at this time. Patient is no evidence of dementia. 08/12/17 on evaluation today patient appears to still be doing fairly well in regard to the draining areas/weeping areas at this point. With that being said she unfortunately did go to the ER yesterday due to what was felt to be possibly a cellulitis. They place her on doxycycline by mouth  and discharge her home. She definitely was not admitted. With that being said she states she has had more discomfort which has been unusual for her even compared to prior times and she's had infections.08/12/17 on evaluation today patient appears to still be doing fairly well in regard to the draining areas/weeping areas at this point. With that being said she unfortunately did go to the ER yesterday due to what was felt to be possibly a cellulitis. They place her on doxycycline by mouth and discharge her home. She definitely was not admitted. With that being said she states she has had more discomfort which has been unusual for her even compared to prior times and she's had infections. 08/19/17 put evaluation today  patient tells me that she's been having a lot of what sounds to be neuropathic type pain in regard to her left lower extremity. She has been using over-the-counter topical bins again which some believe. That in order to apply the she actually remove the wrap we put on her last Wednesday on Thursday. Subsequently she has not had anything on compression wise since that time. The good news is a lot of the weeping areas appear to have closed at this point again I believe she would do better with compression but we are struggling to get her to actually use what she needs to at this point. No fevers, chills, nausea, or vomiting noted at this time. 09/03/17 on evaluation today patient appears to be doing okay in regard to her lower extremities in regard to the lymphedema and weeping. Fortunately she does not seem to show any signs of infection at this point she does have a little bit of weeping occurring in the right medial malleolus area. With that being said this does not appear to be too significant which is good news. 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 She has severe skin damage secondary to both of these features involving the dorsal left foot and medial left ankle and lower leg. Still has open areas in the left anterior foot. The area on the right closed over. She uses her own juxta light stockings. She does not have an arterial issue 09/16/17 on evaluation today patient actually appears to be doing excellent in regard to her bilateral lower extremity swelling. The Juxta-Lite compression wrap seem to be doing very well for her. She has not however been using the portion that goes over her foot. Her left foot still is draining a little bit not nearly as significant as it has been in the past but still I do believe that she likely needs to utilize the full wrap including the foot portion of this will improve as well. She also has  been apparently putting on a significant amount of Vaseline which also think is not helpful for her. I recommended that if she feels she needs something for moisturizer Eucerin will probably be better. 09/30/17 on evaluation today patient presents with several new open areas in regard to her left lower extremity although these appear to be minimal and mainly seem to be more moisture breakdown than anything. Fortunately she does not seem to have any evidence of infection which is great news. She has been tolerating the dressing changes without complication we are using silver alginate on the foot she has been using AB pads to have the legs and using her Juxta- Lite compression which seems to be controlling her swelling very well. Overall I'm pleased with  the poor way she has progressed. 10/14/17 on evaluation today patient appears to be doing better in regard to her left lower extremity areas of weeping. She does still have some discomfort although in general this does not appear to be as macerated and I think it is progressing nicely. I do think she still needs to wear the foot portion of her Juxta- Lite in order to get the most benefit from the wrap obviously. She states she understands. Fortunately there does not appear to be evidence of infection at this time which is great news. 10/28/17 on evaluation today patient appears to be doing excellent in regard to her left lower extremity. She has just a couple areas that are still open and seem to be causing any trouble whatsoever. For that reason I think that she is definitely headed in the right direction the spots are very tiny compared to what we have been dealing with in the past. 11/11/17 on evaluation today patient appears to have a right lateral lower extremity ulcer that has opened since I last saw her. She states this is where the home health nurse that was coming out remove the dressing without wetting the alginate first. Nonetheless I do not know  if this is indeed the case or not but more importantly we have not ordered home help to be coming out for her wounds at all. I'm unsure as to why they are coming out and we're gonna have to check on this and get things situated in that regard. With that being said we currently really do not need them to be coming out as the patient has been taking care of her leg herself without complication and no issues. In fact she was doing much better prior to nursing coming out. 11/25/17 on evaluation today patient actually appears to be doing fairly well in regard to her left lower extremity swelling. In fact she has very little area of weeping at this point there's just a small spot on the lateral portion of her right leg that still has me just a little bit more concerned as far as wanting to see this clear up before I discharge her to caring for this at home. Nonetheless overall she has made excellent progress. 12/09/17 on evaluation today patient appears to be doing rather well in regard to her lower extremity edema. She does have some weeping still in the left lower extremity although the big area we were taking care of two weeks ago actually has closed and she has another area of weeping on the left lower extremity immediately as well is the top of her foot. She does not currently have lymphedema pumps she has been wearing her compression daily on a regular basis as directed. With that being said I think she may benefit from lymphedema pumps. 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 We had a very difficult time getting and keeping this under control. 12/23/17 on evaluation today patient actually appears to be doing a little bit more poorly in regard to her bilateral lower extremities. She has been tolerating the Juxta-Lite compression wraps. Unfortunately  she has two new ulcers on the right lower extremity and left lower Trinity ulceration seems to be larger. Obviously this is not good news. She has been tolerating the dressings without complication. 12/30/17 on evaluation today patient actually appears  to be doing much better in regard to her bilateral lower extremity edema. She continues to have some issues with ulcerations and in fact there appears to be one spot on each leg where the wrap may have caused a little bit of a blister which is subsequently opened up at this point is given her pain. Fortunately it does not appear to be any evidence of infection which is good news. No fevers chills noted. 01/13/18 on evaluation today patient appears to be doing rather well in regard to her bilateral lower extremities. The dressings did get kind of stuck as far as the wound beds are concerned but again I think this is mainly due to the fact that she actually seems to be showing signs of healing which is good news. She's not having as much drainage therefore she was having more of the dressing sticking. Nonetheless overall I feel like her swelling is dramatically down compared to previous. 01/20/18 on evaluation today patient unfortunately though she's doing better in most regards has a large blister on the left anterior lower extremity where she is draining quite significantly. Subsequently this is going to need debridement today in order to see what's underneath and ensure she does not continue to trapping fluid at this location. Nonetheless No fevers, chills, nausea, or vomiting noted at this time. 01/27/18 on evaluation today patient appears to be doing rather well at this point in regard to her right lower extremity there's just a very small area that she still has open at this point. With that being said I do believe that she is tolerating the compression wraps very well in making good progress. Home health is coming out at this point to see her. Her left  lower extremity on the lateral portion is actually what still mainly open and causing her some discomfort for the most part 02/10/18 on evaluation today patient actually appears to be doing very well in regard to her right lower extremity were all the ulcers appear to be completely close. In regard to the left lower extremity she does have two areas still open and some leaking from the dorsal surface of her foot but this still seems to be doing much better to me in general. 02/24/18 on evaluation today patient actually appears to be doing much better in regard to her right lower extremity this is still completely healed. Her left lower extremity is also doing much better fortunately she has no evidence of infection. The one area that is gonna require some debridement is still on the left anterior shin. Fortunately this is not hurting her as badly today. 03/10/18 on evaluation today patient appears to be doing better in some regards although she has a little bit more open area on the dorsal foot and she also has some issues on the medial portion of the left lower extremity which is actually new and somewhat deep. With that being said there fortunately does not appear to be any significant signs of infection which is good news. No fevers, chills, nausea, or vomiting noted at this time. In general her swelling seems to be doing fairly well which is good news. 03/31/18 on evaluation today patient presents for follow-up concerning her left lower extremity lymphedema. Unfortunately she has been doing a little bit more poorly since I last saw her in regard to the amount of weeping that she is experiencing. She's also having some increased pain in the anterior shin location. Unfortunately I do not feel like the patient is  making such good progress at this point a few weeks back she was definitely doing much better. 04/07/18 on evaluation today patient actually appears to be showing some signs of improvement as  far as the left lower extremity is concerned. She has been tolerating the dressing changes and it does appear that the Drawtex did better for her. With that being said unfortunately home health is stating that they cannot obtain the Drawtex going forward. Nonetheless we're gonna have to check and see what they may be able to get the alginate they were using was getting stuck in causing new areas of skin being pulled all that with and subsequently weep and calls her to worsen overall this is the first time we've seen improvement at this time. 04/14/18 on evaluation today patient actually appears to be doing rather well at this point there does not appear to be any evidence of infection at this time and she is actually doing excellent in regard to the weeping in fact she almost has no openings remaining even compared to just last week this is a dramatic improvement. No fevers chills noted 04/21/18 evaluation today patient actually appears to be doing very well. She in fact is has a small area on the posterior lower extremity location and she has a small area on the dorsal surface of her foot that are still open both of which are very close to closing. We're hoping this will be close shortly. She brought her Juxta-Lite wrap with her today hoping that would be able to put her in it unfortunately I don't think were quite at that point yet but we're getting closer. 04/28/18 upon evaluation today patient actually appears to be doing excellent in regard to her left lower extremity ulcer. In fact the region on the posterior lower extremity actually is much smaller than previously noted. Overall I'm very happy with the progress she has made. She again did bring her Juxta-Lite although we're not quite ready for that yet. 05/11/18 upon evaluation today patient actually appears to be doing in general fairly well in regard to her left lower Trinity. The swelling is very well controlled. With that being said she has  a new area on the left anterior lower extremity as well as between the first and second toes of her left foot that was not present during the last evaluation. The region of her posterior left lower extremity actually appears to be almost completely healed. T be honest I'm very pleased with o the way that stands. Nonetheless I do believe that the lotion may be keeping the area to moist as far as her legs are concerned subsequently I'm gonna consider discontinuing that today. 05/26/18 on evaluation today patient appears to be doing rather well in regard to her left lower should be ulcers. In fact everything appears to be close except for a very small area on the left posterior lower extremity. Fortunately there does not appear to be any evidence of infection at this time. Overall very pleased with her progress. 06/02/18 and evaluation today patient actually appears to be doing very well in regard to her lower extremity ulcers. She has one small area that still continues to weep that I think may benefit her being able to justify lotion and user Juxta-Lite wraps versus continued to wrap her. Nonetheless I think this is something we can definitely look into at this point. 06/23/18 on evaluation today patient unfortunately has openings of her bilateral lower extremities. In general she seems to be  doing much worse than when I last saw her just as far as her overall health standpoint is concerned. She states that her discomfort is mainly due to neuropathy she's not having any other issues otherwise. No fevers, chills, nausea, or vomiting noted at this time. 06/30/18 on evaluation today patient actually appears to be doing a little worse in regard to her right lower extremity her left lower extremity of doing fairly well. Fortunately there is no sign of infection at this time. She has been tolerating the dressing changes without complication. Home health did not come out like they were supposed to for the  appropriate wrap changes. They stated that they never received the orders from [place] which were fax. Nonetheless we will send a copy of the orders with the patient today as well. 07/07/18 on evaluation today patient appears to be doing much better in regard to lower extremities. She still has several openings bilaterally although since I last saw her her legs did show obvious signs of infection when she later saw her nurse. Subsequently a culture was obtained and she is been placed on Bactrim and Keflex. Fortunately things seem to be looking much better it does appear she likely had an infection. Again last week we'd even discussed it but again there really was not any obvious sign that she had infection therefore we held off on the antibiotics. Nonetheless I'm glad she's doing better today. 07/14/18 on evaluation today patient appears to be doing much better regarding her bilateral lower Trinity's. In fact on the right lower for me there's nothing open at this point there are some dry skin areas at the sites where she had infection. Fortunately there is no evidence of systemic infection which is excellent news. No fevers chills noted 07/21/18 on evaluation today patient actually appears to be doing much better in regard to her left lower extremity ulcers. She is making good progress and overall I feel like she's improving each time I see her. She's having no pain I do feel like the infection is completely resolved which is excellent news. No fevers, chills, nausea, or vomiting noted at this time. 07/28/18 on evaluation today patient appears to be doing very well in regard to her left lower [employer]. Everything seems to be showing signs of improvement which is excellent news. Overall very pleased with the progress that has been made. Fortunately there's no evidence of active infection at this time also excellent news. 08/04/18 on evaluation today patient appears to be doing more poorly in regard to her  bilateral lower extremities. She has two new areas open up on the right and these were completely closed as of last week. She still has the two spots on the left which in my pinion seem to be doing better. Fortunately there's no evidence of infection again at this point. 08/11/18 on evaluation today patient actually appears to be doing very well in regard to her bilateral lower Trinity wounds that all seem to be doing better and are measures smaller today. Fortunately there's no signs of infection. No fevers, chills, nausea, or vomiting noted at this time. 08/18/18 on evaluation today patient actually appears to be doing about the same inverter bilateral lower extremities. She continues to have areas that blistering open as was drain that fortunately nothing too significant. Overall I feel like Drawtex may have done better for her however compared to the collagen. 08/25/18 on evaluation today patient appears to be doing a little bit more poorly today even  compared to last time I saw her. Again I'm not exactly sure why she's making worse progress over the past several weeks. I'm beginning to wonder if there is some kind of underlying low level infection causing this issue. I did actually take a culture from the left anterior lower extremity but it was a new wound draining quite a bit at this point. Unfortunately she also seems to be having more pain which is what also makes me worried about the possibility of infection. This is despite never erythema noted at this point. 09/01/18 on evaluation today patient actually appears to be doing a little worse even compared to last week in regard to bilateral lower extremities. She did go to the hospital on the 16th was given a dose of IV Zosyn and then discharged with a recommendation to continue with the Bactrim that I previously prescribed for her. Nonetheless she is still having a lot of discomfort she tells me as well at this time. This is definitely unfortunate.  No fevers, chills, nausea, or vomiting noted at this time. 09/08/18 on evaluation today patient's bilateral lower extremities actually appear to be shown signs of improvement which is good news. Fortunately there does not appear to be any signs of active infection I think the anabiotic is helping in this regard. Overall I'm very pleased with how she is progressing. 09/15/18 patient was actually seen in ER yesterday due to her legs as well unfortunately. She states she's been having a lot of pain and discomfort as well as a lot of drainage. Upon inspection today the patient does have a lot of swelling and drainage I feel like this is more related to lymphedema and poor fluid control than it is to infection based on what I'm seeing. The physician in the emergency department also doubted that the patient was having a significant infection nonetheless I see no evidence of infection obvious at this point although I do see evidence of poor fluid control. She still not using a compression pumps, she is not elevating due to her lift chair as well as her hospital bed being broken, and she really is not keeping her legs up as much as they should be and also has been taking off her wraps. All this combined I think has led to poor fluid control and to be honest she may be somewhat volume overloaded in general as well. I recommend that she may need to contact your physician to see if a prescription for a diuretic would be beneficial in their opinion. As long as this is safe I think it would likely help her. 09/29/18 on evaluation today patient's left lower extremity actually appears to be doing quite a bit better. At least compared to last time that I saw her. She still has a large area where she is draining from but there's a lot of new skin speckled trout and in fact there's more new skin that there are open areas of weeping and drainage at this point. This is good news. With regard to the right lower extremity this  is doing much better with the only open area that I really see being a dry spot on the right lateral ankle currently. Fortunately there's no signs of active infection at this time which is good news. No fevers, chills, nausea, or vomiting noted at this time. The patient seems somewhat stressed and overwhelmed during the visit today she was very lethargic as such. She does and she is not taking any pain medications  at this point. Apparently according to her husband are also in the process of moving which is probably taking its toll on her as well. 10/06/18 on evaluation today patient appears to be doing rather well in regard to her lower extremities compared to last evaluation. Fortunately there's no signs of active infection. She tells me she did have an appointment with her primary. Nonetheless he was concerned that the wounds were somewhat deep based on pictures but we never actually saw her legs. She states that he had her somewhat worried due to the fact that she was fearing now that she was [place] have to have an amputation. With that being said based on what I'm seeing check she looks better this week that she has the last two times I've seen her with much less drainage I'm actually pleased in this regard. That doesn't mean that she's out of the water but again I do not think what the point of talking about education at all in regard to her leg. She is very happy to hear this. She is also not having as much pain as she was having last week. 10/13/18 unfortunately on evaluation today patient still continues to have a significant amount of drainage she's not letting home health actually apply the compression dressings at this point. She's trying to use of Juxta-Lite of the top of Kerlex and the second layer of the three layer compression wrap. With that being said she just does not seem to be making as good a progress as I would expect if she was having the compression applied and in place on a  regular basis. No fevers, chills, nausea, or vomiting noted at this time. 10/20/18 on evaluation today patient appears to be doing a little better in regard to her bilateral lower extremity ulcers. In fact the right lower extremity seems to be healed she doesn't even have any openings at this point left lower extremity though still somewhat macerated seems to be showing signs of new skin growth at multiple locations throughout. Fortunately there's no evidence of active infection at this time. No fevers, chills, nausea, or vomiting noted at this time. 10/27/18 on evaluation today patient appears to be doing much better in regard to her left lower Trinity ulcer. She's been tolerating the laptop complication and has minimal drainage noted at this point. Fortunately there's no signs of active infection at this time. No fevers, chills, nausea, or vomiting noted at this time. 11/03/18 on evaluation today patient actually appears to be doing excellent in regard to her left lower extremity. She is having very little drainage at this point there does not appear to be any significant signs of infection overall very pleased with how things have gone. She is likewise extremely pleased still and seems to be making wonderful progress week to week. I do believe antibiotics were helpful for her. Her primary care provider did place on amateur clean since I last saw her. 11/17/18 on evaluation today patient appears to be doing worse in regard to her bilateral lower extremities at this point. She is been tolerating the dressing changes without complication. With that being said she typically takes the [place] off fairly quickly upon arriving home even after being seen here in the clinic and does not allow home health reapply command as part of the dressing at home. Therefore she said no compression essentially since I last saw her as best I can tell. With that being said I think it shows and how much swelling  she has in the  open wounds that are noted at this point. Fortunately there's no signs of infection but unfortunately if she doesn't get this under control I think she will end up with infection and more significant issues. 11/24/18 on evaluation today patient actually appears to be doing somewhat better in regard to her bilateral lower extremities. 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 Nonetheless that is doing better currently so she should be able to attempt reinitiating the lymphedema pumps at this time. No fevers, chills, nausea, or vomiting noted at this time. 12/01/18 upon evaluation today patient's left lower extremity appears to be doing quite well unfortunately her right lower extremity is not doing nearly as well. She has been tolerating the dressing changes without complication unfortunately she did not keep a wrap on the right at this time. Nonetheless I believe this has led to increased swelling and weeping in the world is actually much larger than during the last evaluation with her. 12/08/18 on evaluation today patient appears to be doing about the same at this point in regard to her right lower extremity. There is some more palatable to touch I'm concerned about the possibility of there being some infection although I think the main issue is she's not keeping her compression wrap on which in turn is not allowing this area to heal appropriately. 12/22/18 on evaluation today patient appears to be doing better in regard to left lower extremity unfortunately significantly worse in regard to the right lower extremity. The areas of blistering and necrotic superficial tissue have spread and again this does not really appear to be signs of infection and all she just doesn't seem to be doing nearly as well is what she has been in the past. Overall I feel like the  Augmentin did absolutely nothing for her she doesn't seem to have any infection again I really didn't think so last time either is more of a potential preventative measure and hoping that this would make some difference but I think the main issue is she's not wearing her compression. She tells me she cannot wear the [place] been we put on she takes it off pretty much upon getting home. Subsequently she worshiped Juxta-Lite when I questioned her about how often she wears it this is no more than three hours a day obviously that leaves 21 hours that she has no compression and this is obviously not doing well for her. Overall I'm concerned that if things continue to worsen she is at great risk of both infection as well as losing her leg. 01/05/19 on evaluation today patient appears to be doing well in regard to her left lower extremity which he is allowing [place] to wrap and not so well with regard to her right lower extremity which she is not allowing [place] to really wrap and keep the wrap on. She states that it hurts too badly whenever it's wrapped and she ends up having to take it off. She's been using the Juxta-Lite she tells me up to six hours a day although I question whether or not that's really been the case to be honest. Previously she told me three hours today nonetheless obviously the legs as long as the wrap is doing great when she is not is doing much more poorly. 01/12/2019 on evaluation  today patient actually appears to be doing a little better in my opinion with regard to her right lower extremity ulcer. She has a small open area on the left lower extremity unfortunately but again this I think is part of the normal fluctuation of what she is going to have to expect with regard to her legs especially when she is not using her lymphedema pumps on a regular basis. Subsequently based on what I am seeing today I think that she does seem to be doing slightly better with regard to her right lower extremity  she did see her primary care provider on Monday they felt she had an infection and placed her on 2 antibiotics. Both Cipro and clindamycin. Subsequently again she seems possibly to be doing a little bit better in regards to the right lower extremity she also tells me however she has been wearing the compression wrap over the past week since I spoke with her as well that is a Kerlix and Coban wrap on the right. No fevers, chills, nausea, vomiting, or diarrhea. 01/19/2019 on evaluation today patient appears to be doing better with regard to her bilateral lower extremities especially the right. I feel like the compression has been beneficial for her which is great news. She did get a call from her primary care provider on her way here today telling her that she did have methicillin- resistant Staphylococcus aureus and he was calling in a couple new antibiotics for her including a ointment to be applied she tells me 3 times a day. With that being said this sounds like likely to be Bactroban which I think could be applied with each dressing/wrap change but I would not be able to accommodate her applying this 3 times a day. She is in agreement with the least doing this we will add that to her orders today. 01/26/2019 on evaluation today patient actually appears to be doing much better with regard to her right lower extremity. Her left lower extremity is also doing quite well all things considering. Fortunately there is no evidence of active infection at this time. No fevers, chills, nausea, vomiting, or diarrhea. 02/02/2019 on evaluation today patient appears to be doing much better compared to her last evaluation. Little by little off like her right leg is returning more towards normal. There does not appear to be any signs of active infection and overall she seems to be doing quite well which is great news. I am very pleased in this regard. No fevers, chills, nausea, vomiting, or diarrhea. 02/09/2019 upon  evaluation today patient appears to be doing better with regard to her bilateral lower extremities. She has been tolerating the dressing changes without complication. Fortunately there is no signs of active infection at this time. No fevers, chills, nausea, vomiting, or diarrhea. 02/23/2019 on evaluation today patient actually appears to be doing quite well with regard to her bilateral lower extremities. She has been tolerating the dressing changes without complication. She is even used her pumps one time and states that she really felt like it felt good. With that being said she seems to be in good spirits and her legs appear to be doing excellent. 03/09/2019 on evaluation today patient appears to be doing well with regard to her right lower extremity there are no open wounds at this time she is having some discomfort but I feel like this is more neuropathy than anything. With regard to her left lower extremity she had several areas scattered around that she does  have some weeping and drainage from but again overall she does not appear to be having any significant issues and no evidence of infection at this time which is good news. 03/23/2019 on evaluation today patient appears to be doing well with regard to her right lower extremity which she tells me is still close she is using her juxta light here. Her left lower extremity she mainly just has an area on the foot which is still slightly draining although this also is doing great. Overall very pleased at this time. 04/06/2019 patient appears to be doing a little bit worse in regard to her left lower extremity upon evaluation today. She feels like this could be becoming infected again which she had issues with previous. Fortunately there is no signs of systemic infection but again this is always a struggle with her with her legs she will go from doing well to not so well in a very short amount of time. 04/20/2019 on evaluation today patient actually  appears to be doing quite well with regard to her right lower extremity I do not see any signs of active infection at this time. Fortunately there is no fever chills noted. She is still taking the antibiotics which I prescribed for her at this point. In regard to the left lower extremity I do feel like some of these areas are better although again she still is having weeping from several locations at this time. 04/27/2019 on evaluation today patient appears to be doing about the same if not slightly worse in regard to her left lower extremity ulcers. She tells me when questioned that she has been sleeping in her Hoveround chair in fact she tells me she falls asleep without even knowing it. I think she is spending a whole lot of time in the chair and less time walking and moving around which is not good for her legs either. On top of that she is in a seated position which is also the worst position she is not really elevating her legs and she is also not using her lymphedema pumps. All this is good to contribute to worsening of her condition in general. 05/18/2019 on evaluation today patient appears to be doing well with regard to her lower extremity on the right in fact this is showing no signs of any open wounds at this time. On the left she is continuing to have issues with areas that do drain. Some of the regions have healed and there are couple areas that have reopened. She did go to the ER per the patient according to recommendations from the home health nurse due to what she was seen when she came out on 05/13/2019. Subsequently she felt like the patient needed to go to the hospital due to the fact that again she was having "milky white discharge" from her leg. Nonetheless she had and then was placed on doxycycline and subsequently seems to be doing better. 06/01/2019 upon evaluation today patient appears to be doing really in my opinion about the same. I do not see any signs of active infection  which is good news. Overall she still has wounds over the bilateral lower extremities she has reopened on the right but this appears to be more of a crack where there is weeping/edema coming from the region. I do not see any evidence of infection at either site based on what I visualized today. 07/13/2019 upon evaluation today patient appears to be doing a little worse compared to last time I  saw her. She since has been in the hospital from 06/21/2019 through 06/29/2019. This was secondary to having Covid. During that time they did apply lotion to her legs which unfortunately has caused her to develop a myriad of open wounds on her lower extremities. Her legs do appear to be doing better as far as the overall appearance is concerned but nonetheless she does have more open and weeping areas. 07/27/2019 upon evaluation today patient appears to be doing more poorly to be honest in regard to her left lower extremity in particular. There is no signs of systemic infection although I do believe she may have local infection. She notes she has been having a lot of blue/green drainage which is consistent potentially with Pseudomonas. That may be something that we need to consider here as well. The doxycycline does not seem to have been helping. 08/03/2019 upon evaluation today patient appears to be doing a little better in my opinion compared to last week's evaluation. Her culture I did review today and she is on appropriate medications to help treat the Enterobacter that was noted. Overall I feel like that is good news. With that being said she is unfortunately continuing to have a lot of drainage and though it is doing better I still think she has a long ways to go to get things dried up in general. Fortunately there is no signs of systemic infection. 08/10/2019 upon evaluation today patient appears to be doing may be slightly better in regard to her left lower extremity the right lower extremity is doing  much better. Fortunately there is no signs of infection right now which is good news. No fevers, chills, nausea, vomiting, or diarrhea. 08/24/2019 on evaluation today patient appears to be doing slightly better in regard to her lower extremities. The left lower extremity seems to be healed the right lower extremity is doing better though not completely healed as far as the openings are concerned. She has some generalized issues here with edema and weeping secondary to her lymphedema though again I do believe this is little bit drier compared to prior weeks evaluations. In general I am very pleased with how things seem to be progressing. No fevers, chills, nausea, vomiting, or diarrhea. 08/31/2019 upon evaluation today patient actually seems to making some progress here with regard to the left lower extremity in particular. She has been tolerating the dressing changes without complication. Fortunately there is no signs of active infection at this time. No fevers, chills, nausea, vomiting, or diarrhea. She did see Dr. Doren Custard and he did note that she did have a issue with the left great saphenous vein and the small saphenous vein in the leg. With that being said he was concerned about the possibility of laser ablation not being extremely successful. He also mentioned a small risk of DVT associated with the procedure. However if the wounds do not continue to improve he stated that that would probably be the way to go. Fortunately the patient's legs do seem to be doing much better. 09/07/2019 upon evaluation today patient appears to be doing better with regard to her lower extremities. She has been tolerating the dressing changes without complication. With that being said she is showing signs of improvement and overall very pleased. There are some areas on her leg that I think we do need to debride we discussed this last week the patient is in agreement with doing that as long as it does not hurt too  badly. 09/14/2019 upon evaluation today  patient appears to be doing decently well with regard to her left lower extremity. She is not having near as much weeping as she has had in the past things seem to be drying up which is good news. There is no signs of active infection at this time. 09/21/19 upon evaluation today patient appears to be doing better in regard overall to her bilateral lower extremities. She again has less open than she did previous and each week I feel like this is getting better. Fortunately there is no signs of active infection at this time. No fevers, chills, nausea, vomiting, or diarrhea. 09/28/2019 upon evaluation today patient actually appears to be showing signs of improvement with regard to her left lower extremity. Unfortunately the right medial lower extremity around the ankle region has reopened to some degree but this appears to be minimal still which is good news. There is no signs of active infection at this time which is also good news. 10/12/2019 upon evaluation today patient appears to be doing okay with regard to her bilateral lower extremities today. The right is a little bit worse then last evaluation 2 weeks ago. The left is actually doing a little better in my opinion. Overall there is no signs of active infection at this time that I see. Obviously that something we have to keep a close eye on she is very prone to this with the significant and multiple openings that she has over the bilateral lower extremities. 10/19/2019 upon evaluation today patient appears to be doing about the best that I have seen her in quite some time. She has been tolerating the dressing changes without complication. There does not appear to be any signs of active infection and overall I am extremely happy with the way her legs appeared. She is drying up quite nicely and overall is having less pain. 11/09/2019 upon evaluation today patient appears to be doing better in regard to her wounds.  She seems to be drying up more and more each time I see her this is just taking a very long time. Fortunately there is no signs of active infection at this time. 11/23/2019 upon evaluation today patient actually appears to be doing excellent in regard to her lower extremities at this point compared to where she has been. Fortunately there is no signs of active infection at this time. She did go to the hospital last week for nausea and vomiting completely unrelated to her wounds. Fortunately she is doing better she was given some Reglan and got better. She had associated abdominal pain but they never found out what was going on. Objective Constitutional Obese and well-hydrated in no acute distress. Vitals Time Taken: 10:12 AM, Height: 62 in, Weight: 335 lbs, BMI: 61.3, Temperature: 98.9 F, Pulse: 82 bpm, Respiratory Rate: 20 breaths/min, Blood Pressure: 147/72 mmHg. Respiratory normal breathing without difficulty. Psychiatric this patient is able to make decisions and demonstrates good insight into disease process. Alert and Oriented x 3. pleasant and cooperative. General Notes: Upon inspection patient's wound bed actually showed some slough noted over the left lower extremity and several areas where she did require sharp debridement today. I think this will help allow the areas to heal more effectively and quickly. She tolerated debridement without any pain whatsoever and post debridement everything looks to be doing much better. Integumentary (Hair, Skin) Wound #61 status is Open. Original cause of wound was Gradually Appeared. The wound is located on the Left,Circumferential Lower Leg. The wound measures 17.5cm length x 28cm  width x 0.1cm depth; 384.845cm^2 area and 38.485cm^3 volume. There is Fat Layer (Subcutaneous Tissue) Exposed exposed. There is no tunneling or undermining noted. There is a large amount of serous drainage noted. The wound margin is flat and intact. There is  medium (34-66%) pink, pale granulation within the wound bed. There is a medium (34-66%) amount of necrotic tissue within the wound bed including Adherent Slough. Wound #64 status is Open. Original cause of wound was Gradually Appeared. The wound is located on the Left,Dorsal Foot. The wound measures 6.5cm length x 5cm width x 0.1cm depth; 25.525cm^2 area and 2.553cm^3 volume. There is Fat Layer (Subcutaneous Tissue) Exposed exposed. There is no tunneling or undermining noted. There is a medium amount of serous drainage noted. The wound margin is flat and intact. There is medium (34-66%) pink, pale granulation within the wound bed. There is a medium (34-66%) amount of necrotic tissue within the wound bed including Adherent Slough. Wound #65 status is Open. Original cause of wound was Gradually Appeared. The wound is located on the Right,Medial Lower Leg. The wound measures 0.4cm length x 0.5cm width x 0.1cm depth; 0.157cm^2 area and 0.016cm^3 volume. There is Fat Layer (Subcutaneous Tissue) Exposed exposed. There is no tunneling or undermining noted. There is a small amount of serosanguineous drainage noted. The wound margin is distinct with the outline attached to the wound base. There is large (67-100%) pink granulation within the wound bed. There is no necrotic tissue within the wound bed. 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 There was a Excisional Skin/Subcutaneous Tissue Debridement with a total area of 14 sq cm performed by [name], PA. With the following instrument(s): Curette to remove Viable and Non-Viable tissue/material. Material removed includes Subcutaneous Tissue and Slough and. No specimens were taken. A time out was conducted at 10:30, prior to the start of the procedure. A Minimum amount of bleeding was controlled with Pressure. The procedure was tolerated well with a pain level of 0 throughout and a pain level of 0 following the procedure. Post Debridement Measurements: 17.5cm length x 28cm width x 0.2cm depth; 76.969cm^3 volume. Character of Wound/Ulcer Post Debridement is improved. Severity of Tissue Post Debridement is: Fat layer exposed. Post procedure Diagnosis Wound #61: Same as Pre-Procedure Pre-procedure diagnosis of Wound #61 is a Venous Leg Ulcer located on the Left,Circumferential Lower Leg . There was a Three Layer Compression Therapy Procedure by [name], RN. Post procedure Diagnosis Wound #61: Same as Pre-Procedure Wound #65 Pre-procedure diagnosis of Wound #65 is a Venous Leg Ulcer located on the Right,Medial Lower Leg . There was a Three Layer Compression Therapy Procedure by [name], RN. Post procedure Diagnosis Wound #65: Same as Pre-Procedure Plan Follow-up Appointments: Return Appointment in 2 weeks. Dressing Change Frequency: Wound #61 Left,Circumferential Lower Leg: Change dressing three  times week. Wound #64 Left,Dorsal Foot: Change dressing three times week. Wound #65 Right,Medial Lower Leg: Change dressing three times week. Skin Barriers/Peri-Wound Care: Barrier cream - zinc oxide cream to any macerated  areas Moisturizing lotion - to dry skin Wound Cleansing: Clean wound with Wound Cleanser - all wounds May shower with protection. Primary Wound Dressing: Wound #61 Left,Circumferential Lower Leg: Calcium Alginate with Silver Wound #64 Left,Dorsal Foot: Calcium Alginate with Silver Wound #65 Right,Medial Lower Leg: Calcium Alginate with Silver Secondary Dressing: Wound #61 Left,Circumferential Lower Leg: Dry Gauze - all wounds ABD pad - as needed Wound #64 Left,Dorsal Foot: Dry Gauze - all wounds ABD pad - as needed Wound #65 Right,Medial Lower Leg: Dry Gauze Edema Control: 3 Layer Compression System - Bilateral Avoid standing for long periods of time - walking is encouraged Elevate legs to the level of the heart or above for 30 minutes daily and/or when sitting, a frequency of: - do not sleep in chair with feet dangling, MUST elevate legs while sitting Exercise regularly Segmental Compressive Device. - lymphedema pumps 60 minutes 1- 2 times per day Off-Loading: Turn and reposition every 2 hours Additional Orders / Instructions: Follow Nutritious Diet - T include vitamin A, vitamin C, and Zinc along with increased protein intake. o Home Health: [facility] for [facility]. - [facility]. I would recommend currently that we go ahead and continue with the current wound care measures with regard to compression utilizing the 3 layer compression wraps bilaterally. We will also continue with the silver alginate dressings at this point. 2. With regard to the patient's swelling she also needs to try to elevate her legs much as possible and use her lymphedema pumps all of which are things that we discussed with her in the past I do not believe she continues to use the pumps at all to be perfectly honest. We will see patient back for reevaluation in 2 weeks here in the clinic. If anything worsens or changes patient will contact our office for  additional recommendations. Electronic Signature(s) Signed: 11/23/2019 10:43:48 AM By: [name] PA-C Entered By: [name] on 11/23/2019 10:43:47 -------------------------------------------------------------------------------- SuperBill Details Patient Name: Date of Service: [name] 11/23/2019 Medical Record Number: [id number] Patient Account Number: [id number] Date of Birth/Sex: Treating RN: 05-17-1951 (69 y.o. [name] Primary Care Provider: [name] Other Clinician: Referring Provider: Treating Provider/Extender: Doyle Askew, FRED Weeks in Treatment: 120 Diagnosis Coding ICD-10 Codes Code Description E11.622 Type 2 diabetes mellitus with other skin ulcer I89.0 Lymphedema, not elsewhere classified I87.331 Chronic venous hypertension (idiopathic) with ulcer and inflammation of right lower extremity I87.332 Chronic venous hypertension (idiopathic) with ulcer and inflammation of left lower extremity L97.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 leg with fat layer exposed L97.822 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 I10 Essential (primary) hypertension E66.01 Morbid (severe) obesity due to excess calories F41.8 Other specified anxiety disorders R53.1 Weakness Facility Procedures The patient participates with Medicare or their insurance follows the Medicare Facility Guidelines: CPT4 Code Description Modifier Quantity [id number] - DEB SUBQ TISSUE 20 SQ CM/< 1 ICD-10 Diagnosis Description L97.822 Non-pressure chronic ulcer of  other part of left lower leg with fat layer exposed The patient participates with Medicare or their insurance follows the Medicare Facility Guidelines: [phone] (Facility Use Only) [phone] - APPLY  MULTLAY COMPRS LWR RT LEG 59 1 Physician Procedures : CPT4 Code Description  Modifier [id number] - WC PHYS SUBQ TISS 20 SQ CM ICD-10 Diagnosis Description L97.822 Non-pressure chronic ulcer of other part of left lower leg with fat layer exposed Quantity: 1 Electronic Signature(s) Signed: 11/23/2019 10:46:54 AM By: [name] PA-C Entered By: [name] on 11/23/2019 10:46:52

## 2019-11-23 NOTE — Progress Notes (Signed)
BREEZY, HERTENSTEIN ([id number]) Visit Report for 11/23/2019 Arrival Information Details Patient Name: Date of Service: KASLYN, RICHBURG 11/23/2019 10:00 A M Medical Record Number: [id number] Patient Account Number: [id number] Date of Birth/Sex: Treating RN: [birth year] (69 y.o. [name], Meta.Reding Primary Care Partick Musselman: [name] Other Clinician: Referring Radames Mejorado: Treating Sunshine Mackowski/Extender: Doyle Askew, FRED Weeks in Treatment: 120 Visit Information History Since Last Visit Added or deleted any medications: No Patient Arrived: Wheel Chair Any new allergies or adverse reactions: No Arrival Time: 10:05 Had a fall or experienced change in No Accompanied By: self activities of daily living that may affect Transfer Assistance: None risk of falls: Patient Identification Verified: Yes Signs or symptoms of abuse/neglect since last visito No Secondary Verification Process Completed: Yes Hospitalized since last visit: No Patient Requires Transmission-Based Precautions: No Implantable device outside of the clinic excluding No Patient Has Alerts: Yes cellular tissue based products placed in the center Patient Alerts: R ABI= 1.01 since last visit: L ABI = .99 Has Dressing in Place as Prescribed: Yes Has Compression in Place as Prescribed: Yes Pain Present Now: No Electronic Signature(s) Signed: 11/23/2019 5:57:14 PM By: Deon Pilling Entered By: Deon Pilling on 11/23/2019 10:12:12 -------------------------------------------------------------------------------- Compression Therapy Details Patient Name: Date of Service: [name] 11/23/2019 10:00 A M Medical Record Number: [id number] Patient Account Number: [id number] Date of Birth/Sex: Treating RN: January 27, 1951 (69 y.o. [name] Primary Care Kelechi Astarita: [name] Other Clinician: Referring Juliza Machnik: Treating Terrin Imparato/Extender: Doyle Askew, FRED Weeks in Treatment: 120 Compression Therapy Performed for  Wound Assessment: Wound #61 Left,Circumferential Lower Leg Performed By: Clinician [name], RN Compression Type: Three Layer Post Procedure Diagnosis Same as Pre-procedure Electronic Signature(s) Signed: 11/23/2019 6:08:32 PM By: [name] RN, BSN Entered By: [name] on 11/23/2019 10:34:47 -------------------------------------------------------------------------------- Compression Therapy Details Patient Name: Date of Service: [name] 11/23/2019 10:00 A M Medical Record Number: [id number] Patient Account Number: [id number] Date of Birth/Sex: Treating RN: 09-10-1950 (69 y.o. [name] Primary Care Kaian Fahs: [name] Other Clinician: Referring Keiffer Piper: Treating Naziah Weckerly/Extender: Doyle Askew, FRED Weeks in Treatment: 120 Compression Therapy Performed for Wound Assessment: Wound #65 Right,Medial Lower Leg Performed By: Clinician [name], RN Compression Type: Three Layer Post Procedure Diagnosis Same as Pre-procedure Electronic Signature(s) Signed: 11/23/2019 6:08:32 PM By: [name] RN, BSN Entered By: [name] on 11/23/2019 10:34:47 -------------------------------------------------------------------------------- Lower Extremity Assessment Details Patient Name: Date of Service: [name] 11/23/2019 10:00 A M Medical Record Number: [id number] Patient Account Number: [id number] Date of Birth/Sex: Treating RN: 1950-10-07 (69 y.o. [name] Primary Care Monico Sudduth: [name] Other Clinician: Referring Kollen Armenti: Treating Kameelah Minish/Extender: Doyle Askew, FRED Weeks in Treatment: 120 Edema Assessment Assessed: [Left: Yes] [Right: Yes] Edema: [Left: Yes] [Right: Yes] Calf Left: Right: Point of Measurement: 37 cm From Medial Instep 40 cm 37 cm Ankle Left: Right: Point of Measurement: 10 cm From Medial Instep 30 cm 23 cm Electronic Signature(s) Signed: 11/23/2019 5:57:14 PM By: Deon Pilling Entered By: Deon Pilling on 11/23/2019 10:13:17 -------------------------------------------------------------------------------- [place] Details Patient Name: Date of Service: [name] 11/23/2019 10:00 A M Medical Record Number: [id number] Patient Account Number: [id number] Date of Birth/Sex: Treating RN: 02/24/1951 (69 y.o. [name] Primary Care Kennidi Yoshida: [name] Other Clinician: Referring Thelonious Kauffmann: Treating Davianna Deutschman/Extender: Doyle Askew, FRED Weeks in Treatment: 120 Active Inactive Venous Leg Ulcer Nursing Diagnoses: Actual venous Insuffiency (use after diagnosis is confirmed) Knowledge deficit related to disease process and  management Goals: Patient will maintain optimal edema control Date Initiated: 08/12/2017 Target Resolution Date: 12/21/2019 Goal Status: Active Patient/caregiver will verbalize understanding of disease process and disease management Date Initiated: 08/12/2017 Date Inactivated: 04/21/2018 Target Resolution Date: 04/24/2018 Goal Status: Met Interventions: Assess peripheral edema status every visit. Compression as ordered Treatment Activities: Therapeutic compression applied : 08/12/2017 Notes: Wound/Skin Impairment Nursing Diagnoses: Impaired tissue integrity Knowledge deficit related to ulceration/compromised skin integrity Goals: Patient/caregiver will verbalize understanding of skin care regimen Date Initiated: 08/12/2017 Target Resolution Date: 12/21/2019 Goal Status: Active Ulcer/skin breakdown will have a volume reduction of 30% by week 4 Date Initiated: 08/05/2017 Date Inactivated: 09/30/2017 Target Resolution Date: 10/03/2017 Goal Status: Met Ulcer/skin breakdown will have a volume reduction of 50% by week 8 Date Initiated: 09/30/2017 Date Inactivated: 10/28/2017 Target Resolution Date: 10/28/2017 Goal Status: Met Interventions: Assess patient/caregiver ability to perform ulcer/skin care  regimen upon admission and as needed Assess ulceration(s) every visit Provide education on ulcer and skin care Screen for HBO Treatment Activities: Patient referred to home care : 08/05/2017 Skin care regimen initiated : 08/05/2017 Topical wound management initiated : 08/05/2017 Notes: Electronic Signature(s) Signed: 11/23/2019 6:08:32 PM By: [name] RN, BSN Entered By: [name] on 11/23/2019 10:30:00 -------------------------------------------------------------------------------- Pain Assessment Details Patient Name: Date of Service: [name] 11/23/2019 10:00 A M Medical Record Number: [id number] Patient Account Number: [id number] Date of Birth/Sex: Treating RN: 1950-09-06 (69 y.o. [name] Primary Care Tiphanie Vo: [name] Other Clinician: Referring Karma Hiney: Treating Lowry Bala/Extender: Doyle Askew, FRED Weeks in Treatment: 120 Active Problems Location of Pain Severity and Description of Pain Patient Has Paino No Site Locations Rate the pain. Current Pain Level: 0 Pain Management and Medication Current Pain Management: Medication: No Cold Application: No Rest: No Massage: No Activity: No T.E.N.S.: No Heat Application: No Leg drop or elevation: No Is the Current Pain Management Adequate: Adequate How does your wound impact your activities of daily livingo Sleep: No Bathing: No Appetite: No Relationship With Others: No Bladder Continence: No Emotions: No Bowel Continence: No Work: No Toileting: No Drive: No Dressing: No Hobbies: No Electronic Signature(s) Signed: 11/23/2019 5:57:14 PM By: Deon Pilling Entered By: Deon Pilling on 11/23/2019 10:12:41 -------------------------------------------------------------------------------- Patient/Caregiver Education Details Patient Name: Date of Service: Kratt, Rosilyn M. 6/9/2021andnbsp10:00 [place] Record Number: [id number] Patient Account Number: [id number] Date of  Birth/Gender: Treating RN: 1951-01-18 (69 y.o. [name] Primary Care Physician: [name] Other Clinician: Referring Physician: Treating Physician/Extender: Doyle Askew, FRED Weeks in Treatment: 120 Education Assessment Education Provided To: Patient Education Topics Provided Venous: Methods: Explain/Verbal Responses: Reinforcements needed, State content correctly Wound/Skin Impairment: Methods: Explain/Verbal Responses: Reinforcements needed, State content correctly Electronic Signature(s) Signed: 11/23/2019 6:08:32 PM By: [name] RN, BSN Entered By: [name] on 11/23/2019 10:31:08 -------------------------------------------------------------------------------- Wound Assessment Details Patient Name: Date of Service: [name] 11/23/2019 10:00 A M Medical Record Number: [id number] Patient Account Number: [id number] Date of Birth/Sex: Treating RN: [birth year] (69 y.o. [name] Primary Care Bunnie Lederman: [name] Other Clinician: Referring Demitria Hay: Treating Rollen Selders/Extender: Doyle Askew, FRED Weeks in Treatment: 120 Wound Status Wound Number: 61 Primary Venous Leg Ulcer Etiology: Wound Location: Left, Circumferential Lower Leg Wound Open Wounding Event: Gradually Appeared Status: Date Acquired: 04/20/2019 Comorbid Asthma, Hypertension, Peripheral Arterial Disease, Peripheral Weeks Of Treatment: 31 History: Venous Disease, Type II Diabetes, Gout, Osteoarthritis Clustered Wound: Yes Wound Measurements Length: (cm) 17.5 Width: (cm) 28 Depth: (cm) 0.1 Clustered Quantity: 3 Area: (cm) 384.845 Volume: (  cm) 38.485 % Reduction in Area: -5004% % Reduction in Volume: -5004.1% Epithelialization: Small (1-33%) Tunneling: No Undermining: No Wound Description Classification: Full Thickness Without Exposed Support Structures Wound Margin: Flat and Intact Exudate Amount: Large Exudate Type: Serous Exudate  Color: amber Foul Odor After Cleansing: No Slough/Fibrino Yes Wound Bed Granulation Amount: Medium (34-66%) Exposed Structure Granulation Quality: Pink, Pale Fascia Exposed: No Necrotic Amount: Medium (34-66%) Fat Layer (Subcutaneous Tissue) Exposed: Yes Necrotic Quality: Adherent Slough Tendon Exposed: No Muscle Exposed: No Joint Exposed: No Bone Exposed: No Electronic Signature(s) Signed: 11/23/2019 5:57:14 PM By: Deon Pilling Entered By: Deon Pilling on 11/23/2019 10:14:21 -------------------------------------------------------------------------------- Wound Assessment Details Patient Name: Date of Service: [name] 11/23/2019 10:00 A M Medical Record Number: [id number] Patient Account Number: [id number] Date of Birth/Sex: Treating RN: May 09, 1951 (69 y.o. [name] Primary Care Zaryia Markel: [name] Other Clinician: Referring Ajay Strubel: Treating Audreana Hancox/Extender: Doyle Askew, FRED Weeks in Treatment: 120 Wound Status Wound Number: 64 Primary Diabetic Wound/Ulcer of the Lower Extremity Etiology: Wound Location: Left, Dorsal Foot Wound Open Wounding Event: Gradually Appeared Status: Date Acquired: 06/01/2019 Comorbid Asthma, Hypertension, Peripheral Arterial Disease, Peripheral Weeks Of Treatment: 25 History: Venous Disease, Type II Diabetes, Gout, Osteoarthritis Clustered Wound: No Wound Measurements Length: (cm) 6.5 Width: (cm) 5 Depth: (cm) 0.1 Area: (cm) 25.525 Volume: (cm) 2.553 % Reduction in Area: -15369.7% % Reduction in Volume: -5110.2% Epithelialization: Small (1-33%) Tunneling: No Undermining: No Wound Description Classification: Grade 1 Wound Margin: Flat and Intact Exudate Amount: Medium Exudate Type: Serous Exudate Color: amber Foul Odor After Cleansing: No Slough/Fibrino Yes Wound Bed Granulation Amount: Medium (34-66%) Exposed Structure Granulation Quality: Pink, Pale Fascia Exposed: No Necrotic Amount:  Medium (34-66%) Fat Layer (Subcutaneous Tissue) Exposed: Yes Necrotic Quality: Adherent Slough Tendon Exposed: No Muscle Exposed: No Joint Exposed: No Bone Exposed: No Electronic Signature(s) Signed: 11/23/2019 5:57:14 PM By: Deon Pilling Entered By: Deon Pilling on 11/23/2019 10:15:15 -------------------------------------------------------------------------------- Wound Assessment Details Patient Name: Date of Service: SHALAMAR, PLOURDE 11/23/2019 10:00 A M Medical Record Number: [id number] Patient Account Number: [id number] Date of Birth/Sex: Treating RN: 06-Nov-1950 (69 y.o. [name] Primary Care Deakin Lacek: [name] Other Clinician: Referring Ronasia Isola: Treating Indica Marcott/Extender: Doyle Askew, FRED Weeks in Treatment: 120 Wound Status Wound Number: 65 Primary Venous Leg Ulcer Etiology: Wound Location: Right, Medial Lower Leg Wound Open Wounding Event: Gradually Appeared Status: Date Acquired: 09/28/2019 Comorbid Asthma, Hypertension, Peripheral Arterial Disease, Peripheral Weeks Of Treatment: 8 History: Venous Disease, Type II Diabetes, Gout, Osteoarthritis Clustered Wound: No Wound Measurements Length: (cm) 0.4 Width: (cm) 0.5 Depth: (cm) 0.1 Area: (cm) 0.157 Volume: (cm) 0.016 % Reduction in Area: 85.7% % Reduction in Volume: 85.5% Epithelialization: Large (67-100%) Tunneling: No Undermining: No Wound Description Classification: Full Thickness Without Exposed Support Structures Wound Margin: Distinct, outline attached Exudate Amount: Small Exudate Type: Serosanguineous Exudate Color: red, brown Foul Odor After Cleansing: No Slough/Fibrino No Wound Bed Granulation Amount: Large (67-100%) Exposed Structure Granulation Quality: Pink Fascia Exposed: No Necrotic Amount: None Present (0%) Fat Layer (Subcutaneous Tissue) Exposed: Yes Tendon Exposed: No Muscle Exposed: No Joint Exposed: No Bone Exposed: No Electronic Signature(s) Signed:  11/23/2019 5:57:14 PM By: Deon Pilling Entered By: Deon Pilling on 11/23/2019 10:16:01 -------------------------------------------------------------------------------- Vitals Details Patient Name: Date of Service: [name] 11/23/2019 10:00 A M Medical Record Number: [id number] Patient Account Number: [id number] Date of Birth/Sex: Treating RN: [birth year] (69 y.o. [name] Primary Care Diella Gillingham: [name] Other Clinician: Referring Belicia Difatta: Treating Kerri Asche/Extender: [name]  SMITH, FRED Weeks in Treatment: 120 Vital Signs Time Taken: 10:12 Temperature (F): 98.9 Height (in): 62 Pulse (bpm): 82 Weight (lbs): 335 Respiratory Rate (breaths/min): 20 Body Mass Index (BMI): 61.3 Blood Pressure (mmHg): 147/72 Reference Range: 80 - 120 mg / dl Electronic Signature(s) Signed: 11/23/2019 5:57:14 PM By: Deon Pilling Entered By: Deon Pilling on 11/23/2019 10:12:31

## 2019-11-30 ENCOUNTER — Other Ambulatory Visit: Payer: Self-pay

## 2019-11-30 ENCOUNTER — Encounter: Payer: Self-pay | Admitting: Vascular Surgery

## 2019-11-30 ENCOUNTER — Ambulatory Visit: Payer: Medicare PPO | Admitting: Vascular Surgery

## 2019-11-30 VITALS — BP 113/73 | HR 82 | Temp 97.9°F | Resp 20 | Ht 65.0 in

## 2019-11-30 DIAGNOSIS — I83029 Varicose veins of left lower extremity with ulcer of unspecified site: Secondary | ICD-10-CM | POA: Diagnosis not present

## 2019-11-30 DIAGNOSIS — I872 Venous insufficiency (chronic) (peripheral): Secondary | ICD-10-CM | POA: Diagnosis not present

## 2019-11-30 DIAGNOSIS — I83019 Varicose veins of right lower extremity with ulcer of unspecified site: Secondary | ICD-10-CM | POA: Diagnosis not present

## 2019-11-30 DIAGNOSIS — L97929 Non-pressure chronic ulcer of unspecified part of left lower leg with unspecified severity: Secondary | ICD-10-CM

## 2019-11-30 DIAGNOSIS — L97919 Non-pressure chronic ulcer of unspecified part of right lower leg with unspecified severity: Secondary | ICD-10-CM

## 2019-11-30 NOTE — Progress Notes (Signed)
REASON FOR VISIT:   Follow-up of chronic venous insufficiency  MEDICAL ISSUES:   CHRONIC VENOUS INSUFFICIENCY: This patient has bilateral venous stasis ulcers which are more significant on the left side.  Patient has CEAP C6 venous disease.  She has been elevating her legs.  She continues aggressive wound care at the wound care center with compression therapy.  She states the wounds are continuing to improve.  She tells me that she was recently approved for a pneumatic compression device but has not started using this yet.  I would have strongly encouraged her to begin using her pneumatic compression device.  We have also discussed gust the importance of avoiding prolonged sitting and standing and exercise.  In addition she would benefit from weight loss given that central obesity especially increases lower extremity venous pressure.  I will plan on seeing her back in 3 months.  At that time we will try to get ABIs.  Somewhat difficult to obtain because of her thickened skin however she has not had an arterial Doppler study recently.  She had previous studies which did not suggest underlying arterial insufficiency.  At this point, given the extent of the wounds I would be reluctant to consider laser ablation of the left small saphenous vein.  HPI:   [name] is a pleasant 69 y.o. female who I saw on 08/25/2019 with chronic venous insufficiency.  At that time she had CEAP C6 venous disease with stasis ulcers bilaterally.  We discussed the importance of intermittent leg elevation and continued aggressive wound care with compression therapy by the wound care center.  I felt that if her wounds worsen she might be a candidate for laser ablation of the left great saphenous vein in the proximal calf and also the small saphenous vein in the calf.  The vein was under significant pressure and I will thought there was some increased risk of this not being technically successful.  For this reason I felt we  would only consider laser ablation of the wounds were not continuing to improve.  Previous arterial Doppler study showed no evidence of significant arterial insufficiency.  Since I saw her last she continues to go to the wound care center where they do dressing changes with compression therapy.  She states that the wounds are continuing to improve slowly.  Her activity is very limited.  I do not get any history of claudication or rest pain.  Past Medical History:  Diagnosis Date  . Anxiety   . Arthritis    "back, arms, legs" (03/24/2016)  . Asthma   . Chronic lower back pain   . Colonic polyp    last colonoscopy done in 2009 with normal results per medical record  . Depressive disorder   . Gastric polyp   . Gout    has taken allopurinol 398m once daily in past  . Headache   . History of hiatal hernia   . Hypertension   . Migraine    "none in awhile; might have a couple/year" (03/24/2016)  . Mixed hyperlipidemia    01/2016 Total chol 141, HDL 59, LDL 63, ration 1.1  . Osteoarthritis   . TIA (transient ischemic attack) 11/2014  . Type II diabetes mellitus (HCC)     Family History  Problem Relation Age of Onset  . Stroke Father   . Stroke Brother   . Cancer Other     SOCIAL HISTORY: Social History   Tobacco Use  . Smoking status: Never  Smoker  . Smokeless tobacco: Never Used  Substance Use Topics  . Alcohol use: No    Allergies  Allergen Reactions  . Ace Inhibitors Swelling  . Tizanidine   . Metformin And Related Other (See Comments)    CHILLS  . Propofol Itching    Current Outpatient Medications  Medication Sig Dispense Refill  . ACCU-CHEK FASTCLIX LANCETS MISC 1 each as needed.    Marland Kitchen ACCU-CHEK SMARTVIEW test strip 1 each 4 (four) times daily. for testing  0  . acetaminophen (TYLENOL) 500 MG tablet Take 500 mg by mouth daily as needed for moderate pain.    Marland Kitchen albuterol (PROVENTIL HFA;VENTOLIN HFA) 108 (90 Base) MCG/ACT inhaler Inhale 2 puffs into the lungs every  4 (four) hours as needed for wheezing or shortness of breath.     . allopurinol (ZYLOPRIM) 300 MG tablet Take 300 mg by mouth daily.    Marland Kitchen amitriptyline (ELAVIL) 75 MG tablet Take 75 mg by mouth at bedtime.    Marland Kitchen aspirin 325 MG tablet Take 325 mg by mouth daily.    Marland Kitchen atenolol (TENORMIN) 25 MG tablet Take 25-50 mg by mouth 2 (two) times daily. Take 50 mg every morning and then 25 mg every evening    . Blood Glucose Monitoring Suppl (ACCU-CHEK NANO SMARTVIEW) w/Device KIT 1 each See admin instructions.  0  . Cholecalciferol (VITAMIN D3) 2000 units capsule Take 2,000 Units by mouth daily.    . cyclobenzaprine (FLEXERIL) 5 MG tablet Take 5 mg by mouth 3 (three) times daily as needed.    . diclofenac sodium (VOLTAREN) 1 % GEL Apply 4 g topically 4 (four) times daily. 1 Tube 1  . Diclofenac Sodium CR (VOLTAREN-XR) 100 MG 24 hr tablet Take 1 tablet (100 mg total) by mouth daily. (Patient not taking: Reported on 08/25/2019) 10 tablet 0  . furosemide (LASIX) 20 MG tablet Take 1 tablet (20 mg total) by mouth daily. 30 tablet 0  . gabapentin (NEURONTIN) 300 MG capsule Take 1 capsule (300 mg total) by mouth 2 (two) times daily. (Patient taking differently: Take 600 mg by mouth 2 (two) times daily. ) 60 capsule 2  . hydrocerin (EUCERIN) CREA Apply 1 application topically daily. 228 g 0  . HYDROcodone-acetaminophen (NORCO) 10-325 MG tablet Take 1 tablet by mouth every 6 (six) hours as needed.    . insulin degludec (TRESIBA FLEXTOUCH) 100 UNIT/ML SOPN FlexTouch Pen Inject 0.75 mLs (75 Units total) into the skin daily. (Patient taking differently: Inject 75 Units into the skin every morning. ) 3 mL 1  . magnesium gluconate (MAGONATE) 500 MG tablet Take 500 mg by mouth daily.    Marland Kitchen NIFEdipine (PROCARDIA XL/ADALAT-CC) 90 MG 24 hr tablet Take 90 mg by mouth daily.    Marland Kitchen NOVOFINE 32G X 6 MM MISC 1 each as needed.    Marland Kitchen NOVOLOG FLEXPEN 100 UNIT/ML FlexPen Take 17 Units by mouth 2 (two) times daily.  1  . polyethylene glycol  (MIRALAX / GLYCOLAX) packet Take 17 g by mouth daily as needed for moderate constipation.    . potassium chloride (K-DUR) 10 MEQ tablet Take 1 tablet (10 mEq total) by mouth daily. 30 tablet 0  . protein supplement shake (PREMIER PROTEIN) LIQD Take 325 mLs (11 oz total) by mouth daily.  0   No current facility-administered medications for this visit.    REVIEW OF SYSTEMS:  '[X]'$  denotes positive finding, '[ ]'$  denotes negative finding Cardiac  Comments:  Chest pain or chest pressure:  Shortness of breath upon exertion:    Short of breath when lying flat:    Irregular heart rhythm:        Vascular    Pain in calf, thigh, or hip brought on by ambulation:    Pain in feet at night that wakes you up from your sleep:     Blood clot in your veins:    Leg swelling:   x       Pulmonary    Oxygen at home:    Productive cough:     Wheezing:         Neurologic    Sudden weakness in arms or legs:     Sudden numbness in arms or legs:     Sudden onset of difficulty speaking or slurred speech:    Temporary loss of vision in one eye:     Problems with dizziness:         Gastrointestinal    Blood in stool:     Vomited blood:         Genitourinary    Burning when urinating:     Blood in urine:        Psychiatric    Major depression:         Hematologic    Bleeding problems:    Problems with blood clotting too easily:        Skin    Rashes or ulcers: x       Constitutional    Fever or chills:     PHYSICAL EXAM:   Vitals:   11/30/19 1318  BP: 113/73  Pulse: 82  Resp: 20  Temp: 97.9 F (36.6 C)  TempSrc: Temporal  SpO2: 96%  Height: 5' 5"  (1.651 m)    GENERAL: The patient is a well-nourished female, in no acute distress. The vital signs are documented above. CARDIAC: There is a regular rate and rhythm.  VASCULAR: I cannot palpate pedal pulses.  She has very thick skin and therefore it is difficult to listen with the Doppler. PULMONARY: There is good air exchange  bilaterally without wheezing or rales. ABDOMEN: Soft and non-tender with normal pitched bowel sounds.  MUSCULOSKELETAL: There are no major deformities or cyanosis. NEUROLOGIC: No focal weakness or paresthesias are detected. SKIN: She has a small wound on the medial aspect of her right leg.  She has more extensive wounds circumferentially on her left leg.        PSYCHIATRIC: The patient has a normal affect.  DATA:    No new data  Deitra Mayo [facility] [phone]

## 2019-12-01 ENCOUNTER — Other Ambulatory Visit: Payer: Self-pay | Admitting: *Deleted

## 2019-12-01 DIAGNOSIS — I872 Venous insufficiency (chronic) (peripheral): Secondary | ICD-10-CM

## 2019-12-07 ENCOUNTER — Encounter (HOSPITAL_BASED_OUTPATIENT_CLINIC_OR_DEPARTMENT_OTHER): Payer: Medicare PPO | Admitting: Physician Assistant

## 2019-12-07 DIAGNOSIS — E11622 Type 2 diabetes mellitus with other skin ulcer: Secondary | ICD-10-CM | POA: Diagnosis not present

## 2019-12-07 NOTE — Progress Notes (Addendum)
[name], [name] ([id number]) Visit Report for 12/07/2019 Chief Complaint Document Details Patient Name: Date of Service: [name], [name] 12/07/2019 10:00 A M Medical Record Number: [id number] Patient Account Number: [id number] Date of Birth/Sex: Treating RN: [birth year] (69 y.o. [name] Primary Care Provider: [name] Other Clinician: Referring Provider: Treating Provider/Extender: Doyle Askew, FRED Weeks in Treatment: 122 Information Obtained from: Patient Chief Complaint Bilateral reoccurring LE ulcers Electronic Signature(s) Signed: 12/07/2019 9:59:27 AM By: [name] PA-C Entered By: [name] on 12/07/2019 09:59:26 -------------------------------------------------------------------------------- HPI Details Patient Name: Date of Service: [name]. 12/07/2019 10:00 A M Medical Record Number: [id number] Patient Account Number: [id number] Date of Birth/Sex: Treating RN: April 09, 1951 (69 y.o. [name] Primary Care Provider: [name] Other Clinician: Referring Provider: Treating Provider/Extender: Doyle Askew, FRED Weeks in Treatment: 122 History of Present Illness HPI Description: this patient has been seen a couple of times before and returns with recurrent problems to her right and left lower extremity with swelling and weeping ulcerations due to not wearing her compression stockings which she had been advised to do during her last discharge, at the end of June 2018. During her last visit the patient had had normal arterial blood flow and her venous reflux study did not necessitate any surgical intervention. She was recommended compression and elevation and wound care. After prolonged treatment the patient was completely healed but she has been noncompliant with wearing or compressions.. She was here last week with an outpatient return visit planned but the patient came in a very poor general condition with altered  mental status and was rushed to the ER on my request. With a history of hypertension, diabetes, TIA and right-sided weakness she was set up for an MRI on her brain and cervical spine and was sent to [facility]. Getting an MRI done was very difficult but once the workup was done she was found not to have any spinal stenosis, epidural abscess or hematoma or discitis. This was radiculopathy to be treated as an outpatient and she was given a follow-up appointment. Today she is feeling much better alert and oriented and has come to reevaluate her bilateral lower extremity lymphedema and ulceration 03/25/2017 -- she was admitted to the hospital on 03/16/2017 and discharged on 03/18/2017 with left leg cellulitis and ulceration. She was started on vancomycin and Zosyn and x-ray showed no bony involvement. She was treated for a cellulitis with IV antibiotics changed to Rocephin and Flagyl and was discharged on oral Keflex and doxycycline to complete a 7 day course. Last hemoglobin A1c was 7.1 and her other ailments including hypertension got asthma were appropriately treated. 05/06/2017 -- she is awaiting the right size of compression stockings from [place] but other than that has been doing well. ====== Old notes 69 year old patient was seen one time last October and was lost to follow-up. She has recurrent problems with weeping and ulceration of her left lower extremity and has swelling of this for several years. It has been worse for the last 2 months. Past medical history is significant for diabetes mellitus type 2, hypertension, gout, morbid obesity, depressive disorders, hiatal hernia, migraines, status post knee surgery, risk of a cholecystectomy, vaginal hysterectomy and breast biopsy. She is not a smoker. As noted before she has never had a venous duplex study and an arterial ABI study was attempted but the left lower extremity was noncompressible 10/01/2016 -- had a lower extremity venous duplex  reflux evaluation which showed  no evidence of deep vein reflux in the right or left lower extremity, and no evidence of great saphenous vein reflux more than 500 ms in the right or left lower extremity, and the left small saphenous vein is incompetent but no vascular consult was recommended. review of her electronic medical records noted that the ABI was checked in July 2017 where the right ABI was normal limits and the left ABI could not be ascertained due to pain with cuff pressure but the waveforms are within normal limits. her arterial duplex study scheduled for April 27. 10/08/2016 -- the patient has various reasons for not having a compression on and for the last 3 days she has had no compression on her left lower extremity either due to pain or the lack of nursing help. She does not use her juxta lites either. 10/15/2016 -- the patient did not keep her appointment for arterial duplex study on April 27 and I have asked her to reschedule this. Her pain is out of proportion with the physical findings and she continuously fails to wear a compression wraps and cuts them off because she says she cannot tolerate the pain. She does not use her juxta lites either. 10/22/2016 -- he has rescheduled her arterial duplex study to May 21 and her pain today is a bit better. She has not been wearing her juxta lites on her right lower extremity but now understands that she needs to do this. She did tolerate the to press compression wrap on her left lower extremity 10/29/2016 --arterial duplex study is scheduled for next week and overall she has been tolerating her compression wraps and also using her juxta lites on her right lower extremity 11/05/2016 -- the right ABI was 0.95 the left was 1.03. The digit TBI is on the right was 0.83 on the left was 0.92 and she had biphasic flow through these vessels. The impression was that of normal lower extremity arterial study. 11/12/2016 -- her pain is minimal and she  is doing very well overall. 11/26/2016 -- she has got juxta lites and her insurance will not pay for additional dual layer compression stockings. She is going to order some from [place]. 05/12/2017 -- her juxta lites are very old and too big for her and these have not been helping with compression. She did get 20-30 mm compression stockings from [place] but she and her husband are unable to put these on. I believe she will benefit from bilateral Extremit-ease, compression stockings and we will measure her for these today. 05/20/2017 -- lymphedema on the left lower extremity has increased a lot and she has a open ulceration as a result of this. The right lower extremity is looking pretty good. She has decided to by the compression stockings herself and will get reimbursed by the home health, at a later date. 05/27/2017 -- her sciatica is bothering her a lot and she thought her left leg pain was caused due to the compression wrap and hence removed it and has significant lymphedema. There is no inflammation on this left lower extremity. 06/17/17 on evaluation today patient appears to be doing very well and in fact is completely healed in regard to her ulcerations. Unfortunately however she does have continued issues with lymphedema nonetheless. We did order compression garments for her unfortunately she states that the size that she received were large although we ordered medium. Obviously this means she is not getting the optimal compression. She does not have those with her today and therefore  we could not confirm and contact the company on her behalf. Nonetheless she does state that she is going to have her husband bring them by tomorrow so that we can verify and then get in touch with the company. No fevers, chills, nausea, or vomiting noted at this time. Overall patient is doing better otherwise and I'm pleased with the progress she has made. 07/01/17 on evaluation today patient appears to be doing  very well in regard to her bilateral lower extremity she does not have any openings at this point which is excellent news. Overall I'm pleased with how things have progressed up to this time. Since she is doing so well we did order her compression which we are seeing her today to ensure that it fits her properly and everything is doing well in that regard and then subsequently she will be discharged. ============ Old Notes: 03/31/16 patient presents today for evaluation concerning open wounds that she has over the left medial ankle region as well as the left dorsal foot. She has previously had this occur although it has been healed for a number of months after having this for about a year prior until her hospitalization on 01/05/16. At that point in time it appears that she was admitted to the hospital for left lower extremity cellulitis and was placed on vancomycin and Zosyn at that point. Eventually upon discharge on January 15, 2016 she was placed on doxycycline at that point in time. Later on 03/27/16 positive wound culture growing Escherichia coli this was switched to amoxicillin. Currently she tells me that she is having pain radiated to be a 7 out of 10 which can be as high as 10 out of 10 with palpation and manipulation of the wound. This wound appears to be mainly venous in nature due to the bilateral lower extremity venous stasis/lymphedema. This is definitely much worse on her left than the right side. She does have type 1 diabetes mellitus, hypertension, morbid obesity, and is wheelchair dependent.during the course of the hospital stay a blood culture was also obtained and fortunately appeared negative. She also had an x-ray of the tibia/fibula on the left which showed no acute bone abnormality. Her white blood cell count which was performed last on 03/25/16 was 7.3, hemoglobin 12.8, protein 7.1, albumin 3.0. Her urine culture appeared to be negative for any specific organisms. Patient did  have a left lower extremity venous duplex evaluation for DVT . This did not include venous reflux studies but fortunately was negative for DVT Patient also had arterial studies performed which revealed that she had a . normal ABI on the right though this was unable to be performed on the left secondary to pain that she was having around the ankle region due to the wound. However it was stated on report that she had biphasic pulses and apparently good blood flow. ========== 06/03/17 she is here in follow-up evaluation for right lower extremity ulcer. The right lower sure he has healed but she has reopened to the left medial malleolus and dorsal foot with weeping. She is waiting for new compression garments to arrive from home health, the previous compression garments were ill fitting. We will continue with compression bilaterally and follow-up in 2 weeks Readmission: 08/05/17 on evaluation today patient appears to be doing somewhat poorly in regard to her left lower extremity especially although the right lower extremity has a small area which may no longer be open. She has been having a lot of drainage from the  left lower extremity however he tells me that she has not been able to use the EXTREMIT-EASE Compression at this point. She states that she did better and was able to actually apply the Juxta-Lite compression although the wound that she has is too large and therefore really does not compress which is why she cannot wear it at this point. She has no one who can help her put it on regular basis her son can sometimes but he's not able to do it most of the time. I do believe that's why she has begun to weave and have issues as she is currently yet again. No fevers, chills, nausea, or vomiting noted at this time. Patient is no evidence of dementia. 08/12/17 on evaluation today patient appears to still be doing fairly well in regard to the draining areas/weeping areas at this point. With that being  said she unfortunately did go to the ER yesterday due to what was felt to be possibly a cellulitis. They place her on doxycycline by mouth and discharge her home. She definitely was not admitted. With that being said she states she has had more discomfort which has been unusual for her even compared to prior times and she's had infections.08/12/17 on evaluation today patient appears to still be doing fairly well in regard to the draining areas/weeping areas at this point. With that being said she unfortunately did go to the ER yesterday due to what was felt to be possibly a cellulitis. They place her on doxycycline by mouth and discharge her home. She definitely was not admitted. With that being said she states she has had more discomfort which has been unusual for her even compared to prior times and she's had infections. 08/19/17 put evaluation today patient tells me that she's been having a lot of what sounds to be neuropathic type pain in regard to her left lower extremity. She has been using over-the-counter topical bins again which some believe. That in order to apply the she actually remove the wrap we put on her last Wednesday on Thursday. Subsequently she has not had anything on compression wise since that time. The good news is a lot of the weeping areas appear to have closed at this point again I believe she would do better with compression but we are struggling to get her to actually use what she needs to at this point. No fevers, chills, nausea, or vomiting noted at this time. 09/03/17 on evaluation today patient appears to be doing okay in regard to her lower extremities in regard to the lymphedema and weeping. Fortunately she does not seem to show any signs of infection at this point she does have a little bit of weeping occurring in the right medial malleolus area. With that being said this does not appear to be too significant which is good news. 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 She has severe skin damage secondary to both of these features involving the dorsal left foot and medial left ankle and lower leg. Still has open areas in the left anterior foot. The area on the right closed over. She uses her own juxta light stockings. She does not have an arterial issue 09/16/17 on evaluation today patient actually appears to be doing excellent in regard to her bilateral lower extremity swelling. The Juxta-Lite compression wrap seem to be doing very well for her. She has not however been using the portion that goes over her foot.  Her left foot still is draining a little bit not nearly as significant as it has been in the past but still I do believe that she likely needs to utilize the full wrap including the foot portion of this will improve as well. She also has been apparently putting on a significant amount of Vaseline which also think is not helpful for her. I recommended that if she feels she needs something for moisturizer Eucerin will probably be better. 09/30/17 on evaluation today patient presents with several new open areas in regard to her left lower extremity although these appear to be minimal and mainly seem to be more moisture breakdown than anything. Fortunately she does not seem to have any evidence of infection which is great news. She has been tolerating the dressing changes without complication we are using silver alginate on the foot she has been using AB pads to have the legs and using her Juxta- Lite compression which seems to be controlling her swelling very well. Overall I'm pleased with the poor way she has progressed. 10/14/17 on evaluation today patient appears to be doing better in regard to her left lower extremity areas of weeping. She does still have some discomfort although in general this does not appear to be as macerated and I think it is progressing nicely. I do think she still  needs to wear the foot portion of her Juxta- Lite in order to get the most benefit from the wrap obviously. She states she understands. Fortunately there does not appear to be evidence of infection at this time which is great news. 10/28/17 on evaluation today patient appears to be doing excellent in regard to her left lower extremity. She has just a couple areas that are still open and seem to be causing any trouble whatsoever. For that reason I think that she is definitely headed in the right direction the spots are very tiny compared to what we have been dealing with in the past. 11/11/17 on evaluation today patient appears to have a right lateral lower extremity ulcer that has opened since I last saw her. She states this is where the home health nurse that was coming out remove the dressing without wetting the alginate first. Nonetheless I do not know if this is indeed the case or not but more importantly we have not ordered home help to be coming out for her wounds at all. I'm unsure as to why they are coming out and we're gonna have to check on this and get things situated in that regard. With that being said we currently really do not need them to be coming out as the patient has been taking care of her leg herself without complication and no issues. In fact she was doing much better prior to nursing coming out. 11/25/17 on evaluation today patient actually appears to be doing fairly well in regard to her left lower extremity swelling. In fact she has very little area of weeping at this point there's just a small spot on the lateral portion of her right leg that still has me just a little bit more concerned as far as wanting to see this clear up before I discharge her to caring for this at home. Nonetheless overall she has made excellent progress. 12/09/17 on evaluation today patient appears to be doing rather well in regard to her lower extremity edema. She does have some weeping still in the left  lower extremity although the big area we were taking care of two  weeks ago actually has closed and she has another area of weeping on the left lower extremity immediately as well is the top of her foot. She does not currently have lymphedema pumps she has been wearing her compression daily on a regular basis as directed. With that being said I think she may benefit from lymphedema pumps. 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 We had a very difficult time getting and keeping this under control. 12/23/17 on evaluation today patient actually appears to be doing a little bit more poorly in regard to her bilateral lower extremities. She has been tolerating the Juxta-Lite compression wraps. Unfortunately she has two new ulcers on the right lower extremity and left lower Trinity ulceration seems to be larger. Obviously this is not good news. She has been tolerating the dressings without complication. 12/30/17 on evaluation today patient actually appears to be doing much better in regard to her bilateral lower extremity edema. She continues to have some issues with ulcerations and in fact there appears to be one spot on each leg where the wrap may have caused a little bit of a blister which is subsequently opened up at this point is given her pain. Fortunately it does not appear to be any evidence of infection which is good news. No fevers chills noted. 01/13/18 on evaluation today patient appears to be doing rather well in regard to her bilateral lower extremities. The dressings did get kind of stuck as far as the wound beds are concerned but again I think this is mainly due to the fact that she actually seems to be showing signs of healing which is good news. She's not having as much drainage therefore she was having more of the dressing sticking.  Nonetheless overall I feel like her swelling is dramatically down compared to previous. 01/20/18 on evaluation today patient unfortunately though she's doing better in most regards has a large blister on the left anterior lower extremity where she is draining quite significantly. Subsequently this is going to need debridement today in order to see what's underneath and ensure she does not continue to trapping fluid at this location. Nonetheless No fevers, chills, nausea, or vomiting noted at this time. 01/27/18 on evaluation today patient appears to be doing rather well at this point in regard to her right lower extremity there's just a very small area that she still has open at this point. With that being said I do believe that she is tolerating the compression wraps very well in making good progress. Home health is coming out at this point to see her. Her left lower extremity on the lateral portion is actually what still mainly open and causing her some discomfort for the most part 02/10/18 on evaluation today patient actually appears to be doing very well in regard to her right lower extremity were all the ulcers appear to be completely close. In regard to the left lower extremity she does have two areas still open and some leaking from the dorsal surface of her foot but this still seems to be doing much better to me in general. 02/24/18 on evaluation today patient actually appears to be doing much better in regard to her right lower extremity this is still completely healed. Her left lower extremity is also doing much better fortunately she has no evidence of infection. The one  area that is gonna require some debridement is still on the left anterior shin. Fortunately this is not hurting her as badly today. 03/10/18 on evaluation today patient appears to be doing better in some regards although she has a little bit more open area on the dorsal foot and she also has some issues on the medial portion of  the left lower extremity which is actually new and somewhat deep. With that being said there fortunately does not appear to be any significant signs of infection which is good news. No fevers, chills, nausea, or vomiting noted at this time. In general her swelling seems to be doing fairly well which is good news. 03/31/18 on evaluation today patient presents for follow-up concerning her left lower extremity lymphedema. Unfortunately she has been doing a little bit more poorly since I last saw her in regard to the amount of weeping that she is experiencing. She's also having some increased pain in the anterior shin location. Unfortunately I do not feel like the patient is making such good progress at this point a few weeks back she was definitely doing much better. 04/07/18 on evaluation today patient actually appears to be showing some signs of improvement as far as the left lower extremity is concerned. She has been tolerating the dressing changes and it does appear that the Drawtex did better for her. With that being said unfortunately home health is stating that they cannot obtain the Drawtex going forward. Nonetheless we're gonna have to check and see what they may be able to get the alginate they were using was getting stuck in causing new areas of skin being pulled all that with and subsequently weep and calls her to worsen overall this is the first time we've seen improvement at this time. 04/14/18 on evaluation today patient actually appears to be doing rather well at this point there does not appear to be any evidence of infection at this time and she is actually doing excellent in regard to the weeping in fact she almost has no openings remaining even compared to just last week this is a dramatic improvement. No fevers chills noted 04/21/18 evaluation today patient actually appears to be doing very well. She in fact is has a small area on the posterior lower extremity location and she has  a small area on the dorsal surface of her foot that are still open both of which are very close to closing. We're hoping this will be close shortly. She brought her Juxta-Lite wrap with her today hoping that would be able to put her in it unfortunately I don't think were quite at that point yet but we're getting closer. 04/28/18 upon evaluation today patient actually appears to be doing excellent in regard to her left lower extremity ulcer. In fact the region on the posterior lower extremity actually is much smaller than previously noted. Overall I'm very happy with the progress she has made. She again did bring her Juxta-Lite although we're not quite ready for that yet. 05/11/18 upon evaluation today patient actually appears to be doing in general fairly well in regard to her left lower Trinity. The swelling is very well controlled. With that being said she has a new area on the left anterior lower extremity as well as between the first and second toes of her left foot that was not present during the last evaluation. The region of her posterior left lower extremity actually appears to be almost completely healed. T be honest I'm very pleased  with o the way that stands. Nonetheless I do believe that the lotion may be keeping the area to moist as far as her legs are concerned subsequently I'm gonna consider discontinuing that today. 05/26/18 on evaluation today patient appears to be doing rather well in regard to her left lower should be ulcers. In fact everything appears to be close except for a very small area on the left posterior lower extremity. Fortunately there does not appear to be any evidence of infection at this time. Overall very pleased with her progress. 06/02/18 and evaluation today patient actually appears to be doing very well in regard to her lower extremity ulcers. She has one small area that still continues to weep that I think may benefit her being able to justify lotion and user  Juxta-Lite wraps versus continued to wrap her. Nonetheless I think this is something we can definitely look into at this point. 06/23/18 on evaluation today patient unfortunately has openings of her bilateral lower extremities. In general she seems to be doing much worse than when I last saw her just as far as her overall health standpoint is concerned. She states that her discomfort is mainly due to neuropathy she's not having any other issues otherwise. No fevers, chills, nausea, or vomiting noted at this time. 06/30/18 on evaluation today patient actually appears to be doing a little worse in regard to her right lower extremity her left lower extremity of doing fairly well. Fortunately there is no sign of infection at this time. She has been tolerating the dressing changes without complication. Home health did not come out like they were supposed to for the appropriate wrap changes. They stated that they never received the orders from [place] which were fax. Nonetheless we will send a copy of the orders with the patient today as well. 07/07/18 on evaluation today patient appears to be doing much better in regard to lower extremities. She still has several openings bilaterally although since I last saw her her legs did show obvious signs of infection when she later saw her nurse. Subsequently a culture was obtained and she is been placed on Bactrim and Keflex. Fortunately things seem to be looking much better it does appear she likely had an infection. Again last week we'd even discussed it but again there really was not any obvious sign that she had infection therefore we held off on the antibiotics. Nonetheless I'm glad she's doing better today. 07/14/18 on evaluation today patient appears to be doing much better regarding her bilateral lower Trinity's. In fact on the right lower for me there's nothing open at this point there are some dry skin areas at the sites where she had infection. Fortunately there is  no evidence of systemic infection which is excellent news. No fevers chills noted 07/21/18 on evaluation today patient actually appears to be doing much better in regard to her left lower extremity ulcers. She is making good progress and overall I feel like she's improving each time I see her. She's having no pain I do feel like the infection is completely resolved which is excellent news. No fevers, chills, nausea, or vomiting noted at this time. 07/28/18 on evaluation today patient appears to be doing very well in regard to her left lower [employer]. Everything seems to be showing signs of improvement which is excellent news. Overall very pleased with the progress that has been made. Fortunately there's no evidence of active infection at this time also excellent news. 08/04/18 on evaluation  today patient appears to be doing more poorly in regard to her bilateral lower extremities. She has two new areas open up on the right and these were completely closed as of last week. She still has the two spots on the left which in my pinion seem to be doing better. Fortunately there's no evidence of infection again at this point. 08/11/18 on evaluation today patient actually appears to be doing very well in regard to her bilateral lower Trinity wounds that all seem to be doing better and are measures smaller today. Fortunately there's no signs of infection. No fevers, chills, nausea, or vomiting noted at this time. 08/18/18 on evaluation today patient actually appears to be doing about the same inverter bilateral lower extremities. She continues to have areas that blistering open as was drain that fortunately nothing too significant. Overall I feel like Drawtex may have done better for her however compared to the collagen. 08/25/18 on evaluation today patient appears to be doing a little bit more poorly today even compared to last time I saw her. Again I'm not exactly sure why she's making worse progress over  the past several weeks. I'm beginning to wonder if there is some kind of underlying low level infection causing this issue. I did actually take a culture from the left anterior lower extremity but it was a new wound draining quite a bit at this point. Unfortunately she also seems to be having more pain which is what also makes me worried about the possibility of infection. This is despite never erythema noted at this point. 09/01/18 on evaluation today patient actually appears to be doing a little worse even compared to last week in regard to bilateral lower extremities. She did go to the hospital on the 16th was given a dose of IV Zosyn and then discharged with a recommendation to continue with the Bactrim that I previously prescribed for her. Nonetheless she is still having a lot of discomfort she tells me as well at this time. This is definitely unfortunate. No fevers, chills, nausea, or vomiting noted at this time. 09/08/18 on evaluation today patient's bilateral lower extremities actually appear to be shown signs of improvement which is good news. Fortunately there does not appear to be any signs of active infection I think the anabiotic is helping in this regard. Overall I'm very pleased with how she is progressing. 09/15/18 patient was actually seen in ER yesterday due to her legs as well unfortunately. She states she's been having a lot of pain and discomfort as well as a lot of drainage. Upon inspection today the patient does have a lot of swelling and drainage I feel like this is more related to lymphedema and poor fluid control than it is to infection based on what I'm seeing. The physician in the emergency department also doubted that the patient was having a significant infection nonetheless I see no evidence of infection obvious at this point although I do see evidence of poor fluid control. She still not using a compression pumps, she is not elevating due to her lift chair as well as her  hospital bed being broken, and she really is not keeping her legs up as much as they should be and also has been taking off her wraps. All this combined I think has led to poor fluid control and to be honest she may be somewhat volume overloaded in general as well. I recommend that she may need to contact your physician to see if a  prescription for a diuretic would be beneficial in their opinion. As long as this is safe I think it would likely help her. 09/29/18 on evaluation today patient's left lower extremity actually appears to be doing quite a bit better. At least compared to last time that I saw her. She still has a large area where she is draining from but there's a lot of new skin speckled trout and in fact there's more new skin that there are open areas of weeping and drainage at this point. This is good news. With regard to the right lower extremity this is doing much better with the only open area that I really see being a dry spot on the right lateral ankle currently. Fortunately there's no signs of active infection at this time which is good news. No fevers, chills, nausea, or vomiting noted at this time. The patient seems somewhat stressed and overwhelmed during the visit today she was very lethargic as such. She does and she is not taking any pain medications at this point. Apparently according to her husband are also in the process of moving which is probably taking its toll on her as well. 10/06/18 on evaluation today patient appears to be doing rather well in regard to her lower extremities compared to last evaluation. Fortunately there's no signs of active infection. She tells me she did have an appointment with her primary. Nonetheless he was concerned that the wounds were somewhat deep based on pictures but we never actually saw her legs. She states that he had her somewhat worried due to the fact that she was fearing now that she was [place] have to have an amputation. With that being  said based on what I'm seeing check she looks better this week that she has the last two times I've seen her with much less drainage I'm actually pleased in this regard. That doesn't mean that she's out of the water but again I do not think what the point of talking about education at all in regard to her leg. She is very happy to hear this. She is also not having as much pain as she was having last week. 10/13/18 unfortunately on evaluation today patient still continues to have a significant amount of drainage she's not letting home health actually apply the compression dressings at this point. She's trying to use of Juxta-Lite of the top of Kerlex and the second layer of the three layer compression wrap. With that being said she just does not seem to be making as good a progress as I would expect if she was having the compression applied and in place on a regular basis. No fevers, chills, nausea, or vomiting noted at this time. 10/20/18 on evaluation today patient appears to be doing a little better in regard to her bilateral lower extremity ulcers. In fact the right lower extremity seems to be healed she doesn't even have any openings at this point left lower extremity though still somewhat macerated seems to be showing signs of new skin growth at multiple locations throughout. Fortunately there's no evidence of active infection at this time. No fevers, chills, nausea, or vomiting noted at this time. 10/27/18 on evaluation today patient appears to be doing much better in regard to her left lower Trinity ulcer. She's been tolerating the laptop complication and has minimal drainage noted at this point. Fortunately there's no signs of active infection at this time. No fevers, chills, nausea, or vomiting noted at this time. 11/03/18 on evaluation today  patient actually appears to be doing excellent in regard to her left lower extremity. She is having very little drainage at this point there does not appear  to be any significant signs of infection overall very pleased with how things have gone. She is likewise extremely pleased still and seems to be making wonderful progress week to week. I do believe antibiotics were helpful for her. Her primary care provider did place on amateur clean since I last saw her. 11/17/18 on evaluation today patient appears to be doing worse in regard to her bilateral lower extremities at this point. She is been tolerating the dressing changes without complication. With that being said she typically takes the Coban off fairly quickly upon arriving home even after being seen here in the clinic and does not allow home health reapply command as part of the dressing at home. Therefore she said no compression essentially since I last saw her as best I can tell. With that being said I think it shows and how much swelling she has in the open wounds that are noted at this point. Fortunately there's no signs of infection but unfortunately if she doesn't get this under control I think she will end up with infection and more significant issues. 11/24/18 on evaluation today patient actually appears to be doing somewhat better in regard to her bilateral lower extremities. 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 Nonetheless that is doing better currently so she should be able to attempt reinitiating the lymphedema pumps at this time. No fevers, chills, nausea, or vomiting noted at this time. 12/01/18 upon evaluation today patient's left lower extremity appears to be doing quite well unfortunately her right lower extremity is not doing nearly as well. She has been tolerating the dressing changes without complication unfortunately she did not keep a wrap on the right at this time. Nonetheless I believe this has led to increased swelling and weeping in  the world is actually much larger than during the last evaluation with her. 12/08/18 on evaluation today patient appears to be doing about the same at this point in regard to her right lower extremity. There is some more palatable to touch I'm concerned about the possibility of there being some infection although I think the main issue is she's not keeping her compression wrap on which in turn is not allowing this area to heal appropriately. 12/22/18 on evaluation today patient appears to be doing better in regard to left lower extremity unfortunately significantly worse in regard to the right lower extremity. The areas of blistering and necrotic superficial tissue have spread and again this does not really appear to be signs of infection and all she just doesn't seem to be doing nearly as well is what she has been in the past. Overall I feel like the Augmentin did absolutely nothing for her she doesn't seem to have any infection again I really didn't think so last time either is more of a potential preventative measure and hoping that this would make some difference but I think the main issue is she's not wearing her compression. She tells me she cannot wear the [place] been we put on she takes it off pretty much upon getting home. Subsequently she worshiped Juxta-Lite when I questioned her about how often she wears it this is no  more than three hours a day obviously that leaves 21 hours that she has no compression and this is obviously not doing well for her. Overall I'm concerned that if things continue to worsen she is at great risk of both infection as well as losing her leg. 01/05/19 on evaluation today patient appears to be doing well in regard to her left lower extremity which he is allowing [place] to wrap and not so well with regard to her right lower extremity which she is not allowing [place] to really wrap and keep the wrap on. She states that it hurts too badly whenever it's wrapped and she ends up  having to take it off. She's been using the Juxta-Lite she tells me up to six hours a day although I question whether or not that's really been the case to be honest. Previously she told me three hours today nonetheless obviously the legs as long as the wrap is doing great when she is not is doing much more poorly. 01/12/2019 on evaluation today patient actually appears to be doing a little better in my opinion with regard to her right lower extremity ulcer. She has a small open area on the left lower extremity unfortunately but again this I think is part of the normal fluctuation of what she is going to have to expect with regard to her legs especially when she is not using her lymphedema pumps on a regular basis. Subsequently based on what I am seeing today I think that she does seem to be doing slightly better with regard to her right lower extremity she did see her primary care provider on Monday they felt she had an infection and placed her on 2 antibiotics. Both Cipro and clindamycin. Subsequently again she seems possibly to be doing a little bit better in regards to the right lower extremity she also tells me however she has been wearing the compression wrap over the past week since I spoke with her as well that is a Kerlix and Coban wrap on the right. No fevers, chills, nausea, vomiting, or diarrhea. 01/19/2019 on evaluation today patient appears to be doing better with regard to her bilateral lower extremities especially the right. I feel like the compression has been beneficial for her which is great news. She did get a call from her primary care provider on her way here today telling her that she did have methicillin- resistant Staphylococcus aureus and he was calling in a couple new antibiotics for her including a ointment to be applied she tells me 3 times a day. With that being said this sounds like likely to be Bactroban which I think could be applied with each dressing/wrap change but I  would not be able to accommodate her applying this 3 times a day. She is in agreement with the least doing this we will add that to her orders today. 01/26/2019 on evaluation today patient actually appears to be doing much better with regard to her right lower extremity. Her left lower extremity is also doing quite well all things considering. Fortunately there is no evidence of active infection at this time. No fevers, chills, nausea, vomiting, or diarrhea. 02/02/2019 on evaluation today patient appears to be doing much better compared to her last evaluation. Little by little off like her right leg is returning more towards normal. There does not appear to be any signs of active infection and overall she seems to be doing quite well which is great news. I am very pleased  in this regard. No fevers, chills, nausea, vomiting, or diarrhea. 02/09/2019 upon evaluation today patient appears to be doing better with regard to her bilateral lower extremities. She has been tolerating the dressing changes without complication. Fortunately there is no signs of active infection at this time. No fevers, chills, nausea, vomiting, or diarrhea. 02/23/2019 on evaluation today patient actually appears to be doing quite well with regard to her bilateral lower extremities. She has been tolerating the dressing changes without complication. She is even used her pumps one time and states that she really felt like it felt good. With that being said she seems to be in good spirits and her legs appear to be doing excellent. 03/09/2019 on evaluation today patient appears to be doing well with regard to her right lower extremity there are no open wounds at this time she is having some discomfort but I feel like this is more neuropathy than anything. With regard to her left lower extremity she had several areas scattered around that she does have some weeping and drainage from but again overall she does not appear to be having any  significant issues and no evidence of infection at this time which is good news. 03/23/2019 on evaluation today patient appears to be doing well with regard to her right lower extremity which she tells me is still close she is using her juxta light here. Her left lower extremity she mainly just has an area on the foot which is still slightly draining although this also is doing great. Overall very pleased at this time. 04/06/2019 patient appears to be doing a little bit worse in regard to her left lower extremity upon evaluation today. She feels like this could be becoming infected again which she had issues with previous. Fortunately there is no signs of systemic infection but again this is always a struggle with her with her legs she will go from doing well to not so well in a very short amount of time. 04/20/2019 on evaluation today patient actually appears to be doing quite well with regard to her right lower extremity I do not see any signs of active infection at this time. Fortunately there is no fever chills noted. She is still taking the antibiotics which I prescribed for her at this point. In regard to the left lower extremity I do feel like some of these areas are better although again she still is having weeping from several locations at this time. 04/27/2019 on evaluation today patient appears to be doing about the same if not slightly worse in regard to her left lower extremity ulcers. She tells me when questioned that she has been sleeping in her Hoveround chair in fact she tells me she falls asleep without even knowing it. I think she is spending a whole lot of time in the chair and less time walking and moving around which is not good for her legs either. On top of that she is in a seated position which is also the worst position she is not really elevating her legs and she is also not using her lymphedema pumps. All this is good to contribute to worsening of her condition in  general. 05/18/2019 on evaluation today patient appears to be doing well with regard to her lower extremity on the right in fact this is showing no signs of any open wounds at this time. On the left she is continuing to have issues with areas that do drain. Some of the regions have healed and  there are couple areas that have reopened. She did go to the ER per the patient according to recommendations from the home health nurse due to what she was seen when she came out on 05/13/2019. Subsequently she felt like the patient needed to go to the hospital due to the fact that again she was having "milky white discharge" from her leg. Nonetheless she had and then was placed on doxycycline and subsequently seems to be doing better. 06/01/2019 upon evaluation today patient appears to be doing really in my opinion about the same. I do not see any signs of active infection which is good news. Overall she still has wounds over the bilateral lower extremities she has reopened on the right but this appears to be more of a crack where there is weeping/edema coming from the region. I do not see any evidence of infection at either site based on what I visualized today. 07/13/2019 upon evaluation today patient appears to be doing a little worse compared to last time I saw her. She since has been in the hospital from 06/21/2019 through 06/29/2019. This was secondary to having Covid. During that time they did apply lotion to her legs which unfortunately has caused her to develop a myriad of open wounds on her lower extremities. Her legs do appear to be doing better as far as the overall appearance is concerned but nonetheless she does have more open and weeping areas. 07/27/2019 upon evaluation today patient appears to be doing more poorly to be honest in regard to her left lower extremity in particular. There is no signs of systemic infection although I do believe she may have local infection. She notes she has been having  a lot of blue/green drainage which is consistent potentially with Pseudomonas. That may be something that we need to consider here as well. The doxycycline does not seem to have been helping. 08/03/2019 upon evaluation today patient appears to be doing a little better in my opinion compared to last week's evaluation. Her culture I did review today and she is on appropriate medications to help treat the Enterobacter that was noted. Overall I feel like that is good news. With that being said she is unfortunately continuing to have a lot of drainage and though it is doing better I still think she has a long ways to go to get things dried up in general. Fortunately there is no signs of systemic infection. 08/10/2019 upon evaluation today patient appears to be doing may be slightly better in regard to her left lower extremity the right lower extremity is doing much better. Fortunately there is no signs of infection right now which is good news. No fevers, chills, nausea, vomiting, or diarrhea. 08/24/2019 on evaluation today patient appears to be doing slightly better in regard to her lower extremities. The left lower extremity seems to be healed the right lower extremity is doing better though not completely healed as far as the openings are concerned. She has some generalized issues here with edema and weeping secondary to her lymphedema though again I do believe this is little bit drier compared to prior weeks evaluations. In general I am very pleased with how things seem to be progressing. No fevers, chills, nausea, vomiting, or diarrhea. 08/31/2019 upon evaluation today patient actually seems to making some progress here with regard to the left lower extremity in particular. She has been tolerating the dressing changes without complication. Fortunately there is no signs of active infection at this time. No fevers,  chills, nausea, vomiting, or diarrhea. She did see Dr. Doren Custard and he did note that she did have  a issue with the left great saphenous vein and the small saphenous vein in the leg. With that being said he was concerned about the possibility of laser ablation not being extremely successful. He also mentioned a small risk of DVT associated with the procedure. However if the wounds do not continue to improve he stated that that would probably be the way to go. Fortunately the patient's legs do seem to be doing much better. 09/07/2019 upon evaluation today patient appears to be doing better with regard to her lower extremities. She has been tolerating the dressing changes without complication. With that being said she is showing signs of improvement and overall very pleased. There are some areas on her leg that I think we do need to debride we discussed this last week the patient is in agreement with doing that as long as it does not hurt too badly. 09/14/2019 upon evaluation today patient appears to be doing decently well with regard to her left lower extremity. She is not having near as much weeping as she has had in the past things seem to be drying up which is good news. There is no signs of active infection at this time. 09/21/19 upon evaluation today patient appears to be doing better in regard overall to her bilateral lower extremities. She again has less open than she did previous and each week I feel like this is getting better. Fortunately there is no signs of active infection at this time. No fevers, chills, nausea, vomiting, or diarrhea. 09/28/2019 upon evaluation today patient actually appears to be showing signs of improvement with regard to her left lower extremity. Unfortunately the right medial lower extremity around the ankle region has reopened to some degree but this appears to be minimal still which is good news. There is no signs of active infection at this time which is also good news. 10/12/2019 upon evaluation today patient appears to be doing okay with regard to her bilateral  lower extremities today. The right is a little bit worse then last evaluation 2 weeks ago. The left is actually doing a little better in my opinion. Overall there is no signs of active infection at this time that I see. Obviously that something we have to keep a close eye on she is very prone to this with the significant and multiple openings that she has over the bilateral lower extremities. 10/19/2019 upon evaluation today patient appears to be doing about the best that I have seen her in quite some time. She has been tolerating the dressing changes without complication. There does not appear to be any signs of active infection and overall I am extremely happy with the way her legs appeared. She is drying up quite nicely and overall is having less pain. 11/09/2019 upon evaluation today patient appears to be doing better in regard to her wounds. She seems to be drying up more and more each time I see her this is just taking a very long time. Fortunately there is no signs of active infection at this time. 11/23/2019 upon evaluation today patient actually appears to be doing excellent in regard to her lower extremities at this point compared to where she has been. Fortunately there is no signs of active infection at this time. She did go to the hospital last week for nausea and vomiting completely unrelated to her wounds. Fortunately she is doing better  she was given some Reglan and got better. She had associated abdominal pain but they never found out what was going on. 12/07/2019 upon evaluation today patient appears to be doing well for the most part in regard to her legs. She unfortunately has not been keeping the Coban portion of her wraps on therefore the compression has not really been sufficient for what it is supposed to be. Nonetheless she tells me that it just hurt too bad therefore she removed it. Electronic Signature(s) Signed: 12/07/2019 10:50:50 AM By: Lenda Kelp PA-C Entered By: Lenda Kelp on 12/07/2019 10:50:49 -------------------------------------------------------------------------------- Physical Exam Details Patient Name: Date of Service: SHELLA, LAHMAN 12/07/2019 10:00 A M Medical Record Number: [id number] Patient Account Number: [id number] Date of Birth/Sex: Treating RN: 28-Oct-1950 (69 y.o. [name] Primary Care Provider: Fatima Sanger Other Clinician: Referring Provider: Treating Provider/Extender: Massie Bougie, FRED Weeks in Treatment: 122 Constitutional Well-nourished and well-hydrated in no acute distress. Respiratory normal breathing without difficulty. Psychiatric this patient is able to make decisions and demonstrates good insight into disease process. Alert and Oriented x 3. pleasant and cooperative. Notes Patient's wounds currently are showing signs of some improvement she does not have as many wide open areas that she did previous and overall I feel like this is an improvement. Fortunately there is no signs of active infection at this time. No fevers, chills, nausea, vomiting, or diarrhea. Electronic Signature(s) Signed: 12/07/2019 10:51:06 AM By: Lenda Kelp PA-C Entered By: Lenda Kelp on 12/07/2019 10:51:06 -------------------------------------------------------------------------------- Physician Orders Details Patient Name: Date of Service: [name]. 12/07/2019 10:00 A M Medical Record Number: [id number] Patient Account Number: [id number] Date of Birth/Sex: Treating RN: 11-Jan-1951 (69 y.o. [name] Primary Care Provider: Fatima Sanger Other Clinician: Referring Provider: Treating Provider/Extender: Massie Bougie, FRED Weeks in Treatment: [phone] Verbal / Phone Orders: No Diagnosis Coding ICD-10 Coding Code Description E11.622 Type 2 diabetes mellitus with other skin ulcer I89.0 Lymphedema, not elsewhere classified I87.331 Chronic venous hypertension (idiopathic) with ulcer and  inflammation of right lower extremity I87.332 Chronic venous hypertension (idiopathic) with ulcer and inflammation of left lower extremity L97.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 leg with fat layer exposed L97.822 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 Dressing Change Frequency Wound #61 Left,Circumferential Lower Leg Change dressing three times week. Wound #64 Left,Dorsal Foot Change dressing three times week. Wound #65 Right,Medial Lower Leg Change dressing three times week. Skin Barriers/Peri-Wound Care Barrier cream - zinc oxide cream to any macerated areas Moisturizing lotion - to dry skin Wound Cleansing Clean wound with Wound Cleanser - all wounds May shower with protection. Primary Wound Dressing Wound #61 Left,Circumferential Lower Leg Calcium Alginate with Silver Wound #64 Left,Dorsal Foot Calcium Alginate with Silver Wound #65 Right,Medial Lower Leg Calcium Alginate with Silver Secondary Dressing Wound #61 Left,Circumferential Lower Leg Dry Gauze - all wounds ABD pad - as needed Wound #64 Left,Dorsal Foot Dry Gauze - all wounds ABD pad - as needed Wound #65 Right,Medial Lower Leg Dry Gauze Edema Control 3 Layer Compression System - Bilateral void standing for long periods of time - walking is encouraged A Elevate legs to the level of the heart  or above for 30 minutes daily and/or when sitting, a frequency of: - do not sleep in chair with feet dangling, MUST elevate legs while sitting Exercise regularly Segmental Compressive Device. - lymphedema pumps 60 minutes 1- 2 times per day Off-Loading Turn and reposition  every 2 hours Additional Orders / Instructions Follow Nutritious Diet - T include vitamin A, vitamin C, and Zinc along with increased protein intake. o Home Health Continue Home Health skilled nursing for wound care. - [facility] Electronic Signature(s) Signed: 12/07/2019 4:25:30 PM By: Zenaida Deed RN, BSN Signed: 12/09/2019 3:47:20 PM By: Lenda Kelp PA-C Entered By: Zenaida Deed on 12/07/2019 10:43:20 -------------------------------------------------------------------------------- Problem List Details Patient Name: Date of Service: [name]. 12/07/2019 10:00 A M Medical Record Number: [id number] Patient Account Number: [id number] Date of Birth/Sex: Treating RN: 08/13/1950 (69 y.o. [name] Primary Care Provider: Fatima Sanger Other Clinician: Referring Provider: Treating Provider/Extender: Massie Bougie, FRED Weeks in Treatment: 122 Active Problems ICD-10 Encounter Code Description Active Date MDM Diagnosis E11.622 Type 2 diabetes mellitus with other skin ulcer 08/05/2017 No Yes I89.0 Lymphedema, not elsewhere classified 08/05/2017 No Yes I87.331 Chronic venous hypertension (idiopathic) with ulcer and inflammation of right 08/05/2017 No Yes lower extremity I87.332 Chronic venous hypertension (idiopathic) with ulcer and inflammation of left 08/05/2017 No Yes lower extremity L97.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 leg with fat layer 08/05/2017 No Yes exposed L97.822 Non-pressure chronic ulcer of other part of left lower leg with fat layer exposed2/20/2019 No Yes L97.522 Non-pressure chronic ulcer of other part of left foot with fat layer exposed 06/01/2019 No Yes I10 Essential (primary) hypertension 08/05/2017 No Yes E66.01 Morbid (severe) obesity due to excess calories 08/05/2017 No Yes F41.8 Other specified anxiety disorders 08/05/2017 No Yes R53.1 Weakness 08/05/2017 No Yes Inactive Problems Resolved Problems Electronic  Signature(s) Signed: 12/07/2019 9:59:18 AM By: Lenda Kelp PA-C Entered By: Lenda Kelp on 12/07/2019 09:59:17 -------------------------------------------------------------------------------- Progress Note Details Patient Name: Date of Service: [name]. 12/07/2019 10:00 A M Medical Record Number: [id number] Patient Account Number: [id number] Date of Birth/Sex: Treating RN: December 01, 1950 (69 y.o. [name] Primary Care Provider: Fatima Sanger Other Clinician: Referring Provider: Treating Provider/Extender: Massie Bougie, FRED Weeks in Treatment: 122 Subjective Chief Complaint Information obtained from Patient Bilateral reoccurring LE ulcers History of Present Illness (HPI) this patient has been seen a couple of times before and returns with recurrent problems to her right and left lower extremity with swelling and weeping ulcerations due to not wearing her compression stockings which she had been advised to do during her last discharge, at the end of June 2018. During her last visit the patient had had normal arterial blood flow and her venous reflux study did not necessitate any surgical intervention. She was recommended compression and elevation and wound care. After prolonged treatment the patient was completely healed but she has been noncompliant with wearing or compressions.. She was here last week with an outpatient return visit planned but the patient came in a very poor general condition with altered mental status and was rushed to the ER on my request. With a history of hypertension, diabetes, TIA and right-sided weakness she was set up for an MRI on her brain and cervical spine and was sent to [facility]. Getting an MRI done was very difficult but once the workup was done she was found not to have any spinal stenosis, epidural abscess or hematoma or discitis. This was radiculopathy to  be treated as an outpatient and she was given a follow-up  appointment. Today she is feeling much better alert and oriented and has come to reevaluate her bilateral lower extremity lymphedema and ulceration 03/25/2017 -- she was admitted to the hospital on 03/16/2017 and discharged on 03/18/2017 with left leg cellulitis and ulceration. She was started on vancomycin and Zosyn and x-ray showed no bony involvement. She was treated for a cellulitis with IV antibiotics changed to Rocephin and Flagyl and was discharged on oral Keflex and doxycycline to complete a 7 day course. Last hemoglobin A1c was 7.1 and her other ailments including hypertension got asthma were appropriately treated. 05/06/2017 -- she is awaiting the right size of compression stockings from [place] but other than that has been doing well. ====== Old notes 69 year old patient was seen one time last October and was lost to follow-up. She has recurrent problems with weeping and ulceration of her left lower extremity and has swelling of this for several years. It has been worse for the last 2 months. Past medical history is significant for diabetes mellitus type 2, hypertension, gout, morbid obesity, depressive disorders, hiatal hernia, migraines, status post knee surgery, risk of a cholecystectomy, vaginal hysterectomy and breast biopsy. She is not a smoker. As noted before she has never had a venous duplex study and an arterial ABI study was attempted but the left lower extremity was noncompressible 10/01/2016 -- had a lower extremity venous duplex reflux evaluation which showed no evidence of deep vein reflux in the right or left lower extremity, and no evidence of great saphenous vein reflux more than 500 ms in the right or left lower extremity, and the left small saphenous vein is incompetent but no vascular consult was recommended. review of her electronic medical records noted that the ABI was checked in July 2017 where the right ABI was normal limits and the left ABI could not  be ascertained due to pain with cuff pressure but the waveforms are within normal limits. her arterial duplex study scheduled for April 27. 10/08/2016 -- the patient has various reasons for not having a compression on and for the last 3 days she has had no compression on her left lower extremity either due to pain or the lack of nursing help. She does not use her juxta lites either. 10/15/2016 -- the patient did not keep her appointment for arterial duplex study on April 27 and I have asked her to reschedule this. Her pain is out of proportion with the physical findings and she continuously fails to wear a compression wraps and cuts them off because she says she cannot tolerate the pain. She does not use her juxta lites either. 10/22/2016 -- he has rescheduled her arterial duplex study to May 21 and her pain today is a bit better. She has not been wearing her juxta lites on her right lower extremity but now understands that she needs to do this. She did tolerate the to press compression wrap on her left lower extremity 10/29/2016 --arterial duplex study is scheduled for next week and overall she has been tolerating her compression wraps and also using her juxta lites on her right lower extremity 11/05/2016 -- the right ABI was 0.95 the left was 1.03. The digit TBI is on the right was 0.83 on the left was 0.92 and she had biphasic flow through these vessels. The impression was that of normal lower extremity arterial study. 11/12/2016 -- her pain is minimal and she is doing very well overall.  11/26/2016 -- she has got juxta lites and her insurance will not pay for additional dual layer compression stockings. She is going to order some from [place]. 05/12/2017 -- her juxta lites are very old and too big for her and these have not been helping with compression. She did get 20-30 mm compression stockings from  but she and her husband are unable to put these on. I believe she will benefit from  bilateral Extremit-ease, compression stockings and we will measure her for these today. 05/20/2017 -- lymphedema on the left lower extremity has increased a lot and she has a open ulceration as a result of this. The right lower extremity is looking pretty good. She has decided to by the compression stockings herself and will get reimbursed by the home health, at a later date. 05/27/2017 -- her sciatica is bothering her a lot and she thought her left leg pain was caused due to the compression wrap and hence removed it and has significant lymphedema. There is no inflammation on this left lower extremity. 06/17/17 on evaluation today patient appears to be doing very well and in fact is completely healed in regard to her ulcerations. Unfortunately however she does have continued issues with lymphedema nonetheless. We did order compression garments for her unfortunately she states that the size that she received were large although we ordered medium. Obviously this means she is not getting the optimal compression. She does not have those with her today and therefore we could not confirm and contact the company on her behalf. Nonetheless she does state that she is going to have her husband bring them by tomorrow so that we can verify and then get in touch with the company. No fevers, chills, nausea, or vomiting noted at this time. Overall patient is doing better otherwise and I'm pleased with the progress she has made. 07/01/17 on evaluation today patient appears to be doing very well in regard to her bilateral lower extremity she does not have any openings at this point which is excellent news. Overall I'm pleased with how things have progressed up to this time. Since she is doing so well we did order her compression which we are seeing her today to ensure that it fits her properly and everything is doing well in that regard and then subsequently she will be discharged. ============ Old Notes: 03/31/16  patient presents today for evaluation concerning open wounds that she has over the left medial ankle region as well as the left dorsal foot. She has previously had this occur although it has been healed for a number of months after having this for about a year prior until her hospitalization on 01/05/16. At that point in time it appears that she was admitted to the hospital for left lower extremity cellulitis and was placed on vancomycin and Zosyn at that point. Eventually upon discharge on January 15, 2016 she was placed on doxycycline at that point in time. Later on 03/27/16 positive wound culture growing Escherichia coli this was switched to amoxicillin. Currently she tells me that she is having pain radiated to be a 7 out of 10 which can be as high as 10 out of 10 with palpation and manipulation of the wound. This wound appears to be mainly venous in nature due to the bilateral lower extremity venous stasis/lymphedema. This is definitely much worse on her left than the right side. She does have type 1 diabetes mellitus, hypertension, morbid obesity, and is wheelchair dependent.during the course of the hospital  stay a blood culture was also obtained and fortunately appeared negative. She also had an x-ray of the tibia/fibula on the left which showed no acute bone abnormality. Her white blood cell count which was performed last on 03/25/16 was 7.3, hemoglobin 12.8, protein 7.1, albumin 3.0. Her urine culture appeared to be negative for any specific organisms. Patient did have a left lower extremity venous duplex evaluation for DVT . This did not include venous reflux studies but fortunately was negative for DVT Patient also had arterial studies performed which revealed that she had a . normal ABI on the right though this was unable to be performed on the left secondary to pain that she was having around the ankle region due to the wound. However it was stated on report that she had biphasic pulses and  apparently good blood flow. ========== 06/03/17 she is here in follow-up evaluation for right lower extremity ulcer. The right lower sure he has healed but she has reopened to the left medial malleolus and dorsal foot with weeping. She is waiting for new compression garments to arrive from home health, the previous compression garments were ill fitting. We will continue with compression bilaterally and follow-up in 2 weeks Readmission: 08/05/17 on evaluation today patient appears to be doing somewhat poorly in regard to her left lower extremity especially although the right lower extremity has a small area which may no longer be open. She has been having a lot of drainage from the left lower extremity however he tells me that she has not been able to use the EXTREMIT-EASE Compression at this point. She states that she did better and was able to actually apply the Juxta-Lite compression although the wound that she has is too large and therefore really does not compress which is why she cannot wear it at this point. She has no one who can help her put it on regular basis her son can sometimes but he's not able to do it most of the time. I do believe that's why she has begun to weave and have issues as she is currently yet again. No fevers, chills, nausea, or vomiting noted at this time. Patient is no evidence of dementia. 08/12/17 on evaluation today patient appears to still be doing fairly well in regard to the draining areas/weeping areas at this point. With that being said she unfortunately did go to the ER yesterday due to what was felt to be possibly a cellulitis. They place her on doxycycline by mouth and discharge her home. She definitely was not admitted. With that being said she states she has had more discomfort which has been unusual for her even compared to prior times and she's had infections.08/12/17 on evaluation today patient appears to still be doing fairly well in regard to the draining  areas/weeping areas at this point. With that being said she unfortunately did go to the ER yesterday due to what was felt to be possibly a cellulitis. They place her on doxycycline by mouth and discharge her home. She definitely was not admitted. With that being said she states she has had more discomfort which has been unusual for her even compared to prior times and she's had infections. 08/19/17 put evaluation today patient tells me that she's been having a lot of what sounds to be neuropathic type pain in regard to her left lower extremity. She has been using over-the-counter topical bins again which some believe. That in order to apply the she actually remove the wrap we  put on her last Wednesday on Thursday. Subsequently she has not had anything on compression wise since that time. The good news is a lot of the weeping areas appear to have closed at this point again I believe she would do better with compression but we are struggling to get her to actually use what she needs to at this point. No fevers, chills, nausea, or vomiting noted at this time. 09/03/17 on evaluation today patient appears to be doing okay in regard to her lower extremities in regard to the lymphedema and weeping. Fortunately she does not seem to show any signs of infection at this point she does have a little bit of weeping occurring in the right medial malleolus area. With that being said this does not appear to be too significant which is good news. 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 She has severe skin damage secondary to both of these features involving the dorsal left foot and medial left ankle and lower leg. Still has open areas in the left anterior foot. The area on the right closed over. She uses her own juxta light stockings. She does not have an arterial issue 09/16/17 on evaluation today patient actually appears to be doing excellent in  regard to her bilateral lower extremity swelling. The Juxta-Lite compression wrap seem to be doing very well for her. She has not however been using the portion that goes over her foot. Her left foot still is draining a little bit not nearly as significant as it has been in the past but still I do believe that she likely needs to utilize the full wrap including the foot portion of this will improve as well. She also has been apparently putting on a significant amount of Vaseline which also think is not helpful for her. I recommended that if she feels she needs something for moisturizer Eucerin will probably be better. 09/30/17 on evaluation today patient presents with several new open areas in regard to her left lower extremity although these appear to be minimal and mainly seem to be more moisture breakdown than anything. Fortunately she does not seem to have any evidence of infection which is great news. She has been tolerating the dressing changes without complication we are using silver alginate on the foot she has been using AB pads to have the legs and using her Juxta- Lite compression which seems to be controlling her swelling very well. Overall I'm pleased with the poor way she has progressed. 10/14/17 on evaluation today patient appears to be doing better in regard to her left lower extremity areas of weeping. She does still have some discomfort although in general this does not appear to be as macerated and I think it is progressing nicely. I do think she still needs to wear the foot portion of her Juxta- Lite in order to get the most benefit from the wrap obviously. She states she understands. Fortunately there does not appear to be evidence of infection at this time which is great news. 10/28/17 on evaluation today patient appears to be doing excellent in regard to her left lower extremity. She has just a couple areas that are still open and seem to be causing any trouble whatsoever. For that  reason I think that she is definitely headed in the right direction the spots are very tiny compared to what we have been dealing with in the past. 11/11/17 on evaluation today patient appears to have a  right lateral lower extremity ulcer that has opened since I last saw her. She states this is where the home health nurse that was coming out remove the dressing without wetting the alginate first. Nonetheless I do not know if this is indeed the case or not but more importantly we have not ordered home help to be coming out for her wounds at all. I'm unsure as to why they are coming out and we're gonna have to check on this and get things situated in that regard. With that being said we currently really do not need them to be coming out as the patient has been taking care of her leg herself without complication and no issues. In fact she was doing much better prior to nursing coming out. 11/25/17 on evaluation today patient actually appears to be doing fairly well in regard to her left lower extremity swelling. In fact she has very little area of weeping at this point there's just a small spot on the lateral portion of her right leg that still has me just a little bit more concerned as far as wanting to see this clear up before I discharge her to caring for this at home. Nonetheless overall she has made excellent progress. 12/09/17 on evaluation today patient appears to be doing rather well in regard to her lower extremity edema. She does have some weeping still in the left lower extremity although the big area we were taking care of two weeks ago actually has closed and she has another area of weeping on the left lower extremity immediately as well is the top of her foot. She does not currently have lymphedema pumps she has been wearing her compression daily on a regular basis as directed. With that being said I think she may benefit from lymphedema pumps. 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 We had a very difficult time getting and keeping this under control. 12/23/17 on evaluation today patient actually appears to be doing a little bit more poorly in regard to her bilateral lower extremities. She has been tolerating the Juxta-Lite compression wraps. Unfortunately she has two new ulcers on the right lower extremity and left lower Trinity ulceration seems to be larger. Obviously this is not good news. She has been tolerating the dressings without complication. 12/30/17 on evaluation today patient actually appears to be doing much better in regard to her bilateral lower extremity edema. She continues to have some issues with ulcerations and in fact there appears to be one spot on each leg where the wrap may have caused a little bit of a blister which is subsequently opened up at this point is given her pain. Fortunately it does not appear to be any evidence of infection which is good news. No fevers chills noted. 01/13/18 on evaluation today patient appears to be doing rather well in regard to her bilateral lower extremities. The dressings did get kind of stuck as far as the wound beds are concerned but again I think this is mainly due to the fact that she actually seems to be showing signs of healing which is good news. She's not having as much drainage therefore she was having more of the dressing sticking. Nonetheless overall I feel like her swelling is dramatically down compared to previous. 01/20/18 on evaluation today patient unfortunately though she's doing better  in most regards has a large blister on the left anterior lower extremity where she is draining quite significantly. Subsequently this is going to need debridement today in order to see what's underneath and ensure she does not continue to trapping fluid at this location. Nonetheless No  fevers, chills, nausea, or vomiting noted at this time. 01/27/18 on evaluation today patient appears to be doing rather well at this point in regard to her right lower extremity there's just a very small area that she still has open at this point. With that being said I do believe that she is tolerating the compression wraps very well in making good progress. Home health is coming out at this point to see her. Her left lower extremity on the lateral portion is actually what still mainly open and causing her some discomfort for the most part 02/10/18 on evaluation today patient actually appears to be doing very well in regard to her right lower extremity were all the ulcers appear to be completely close. In regard to the left lower extremity she does have two areas still open and some leaking from the dorsal surface of her foot but this still seems to be doing much better to me in general. 02/24/18 on evaluation today patient actually appears to be doing much better in regard to her right lower extremity this is still completely healed. Her left lower extremity is also doing much better fortunately she has no evidence of infection. The one area that is gonna require some debridement is still on the left anterior shin. Fortunately this is not hurting her as badly today. 03/10/18 on evaluation today patient appears to be doing better in some regards although she has a little bit more open area on the dorsal foot and she also has some issues on the medial portion of the left lower extremity which is actually new and somewhat deep. With that being said there fortunately does not appear to be any significant signs of infection which is good news. No fevers, chills, nausea, or vomiting noted at this time. In general her swelling seems to be doing fairly well which is good news. 03/31/18 on evaluation today patient presents for follow-up concerning her left lower extremity lymphedema. Unfortunately she has been  doing a little bit more poorly since I last saw her in regard to the amount of weeping that she is experiencing. She's also having some increased pain in the anterior shin location. Unfortunately I do not feel like the patient is making such good progress at this point a few weeks back she was definitely doing much better. 04/07/18 on evaluation today patient actually appears to be showing some signs of improvement as far as the left lower extremity is concerned. She has been tolerating the dressing changes and it does appear that the Drawtex did better for her. With that being said unfortunately home health is stating that they cannot obtain the Drawtex going forward. Nonetheless we're gonna have to check and see what they may be able to get the alginate they were using was getting stuck in causing new areas of skin being pulled all that with and subsequently weep and calls her to worsen overall this is the first time we've seen improvement at this time. 04/14/18 on evaluation today patient actually appears to be doing rather well at this point there does not appear to be any evidence of infection at this time and she is actually doing excellent in regard to the weeping in fact she  almost has no openings remaining even compared to just last week this is a dramatic improvement. No fevers chills noted 04/21/18 evaluation today patient actually appears to be doing very well. She in fact is has a small area on the posterior lower extremity location and she has a small area on the dorsal surface of her foot that are still open both of which are very close to closing. We're hoping this will be close shortly. She brought her Juxta-Lite wrap with her today hoping that would be able to put her in it unfortunately I don't think were quite at that point yet but we're getting closer. 04/28/18 upon evaluation today patient actually appears to be doing excellent in regard to her left lower extremity ulcer. In fact  the region on the posterior lower extremity actually is much smaller than previously noted. Overall I'm very happy with the progress she has made. She again did bring her Juxta-Lite although we're not quite ready for that yet. 05/11/18 upon evaluation today patient actually appears to be doing in general fairly well in regard to her left lower Trinity. The swelling is very well controlled. With that being said she has a new area on the left anterior lower extremity as well as between the first and second toes of her left foot that was not present during the last evaluation. The region of her posterior left lower extremity actually appears to be almost completely healed. T be honest I'm very pleased with o the way that stands. Nonetheless I do believe that the lotion may be keeping the area to moist as far as her legs are concerned subsequently I'm gonna consider discontinuing that today. 05/26/18 on evaluation today patient appears to be doing rather well in regard to her left lower should be ulcers. In fact everything appears to be close except for a very small area on the left posterior lower extremity. Fortunately there does not appear to be any evidence of infection at this time. Overall very pleased with her progress. 06/02/18 and evaluation today patient actually appears to be doing very well in regard to her lower extremity ulcers. She has one small area that still continues to weep that I think may benefit her being able to justify lotion and user Juxta-Lite wraps versus continued to wrap her. Nonetheless I think this is something we can definitely look into at this point. 06/23/18 on evaluation today patient unfortunately has openings of her bilateral lower extremities. In general she seems to be doing much worse than when I last saw her just as far as her overall health standpoint is concerned. She states that her discomfort is mainly due to neuropathy she's not having any other  issues otherwise. No fevers, chills, nausea, or vomiting noted at this time. 06/30/18 on evaluation today patient actually appears to be doing a little worse in regard to her right lower extremity her left lower extremity of doing fairly well. Fortunately there is no sign of infection at this time. She has been tolerating the dressing changes without complication. Home health did not come out like they were supposed to for the appropriate wrap changes. They stated that they never received the orders from [place] which were fax. Nonetheless we will send a copy of the orders with the patient today as well. 07/07/18 on evaluation today patient appears to be doing much better in regard to lower extremities. She still has several openings bilaterally although since I last saw her her legs did show obvious signs  of infection when she later saw her nurse. Subsequently a culture was obtained and she is been placed on Bactrim and Keflex. Fortunately things seem to be looking much better it does appear she likely had an infection. Again last week we'd even discussed it but again there really was not any obvious sign that she had infection therefore we held off on the antibiotics. Nonetheless I'm glad she's doing better today. 07/14/18 on evaluation today patient appears to be doing much better regarding her bilateral lower Trinity's. In fact on the right lower for me there's nothing open at this point there are some dry skin areas at the sites where she had infection. Fortunately there is no evidence of systemic infection which is excellent news. No fevers chills noted 07/21/18 on evaluation today patient actually appears to be doing much better in regard to her left lower extremity ulcers. She is making good progress and overall I feel like she's improving each time I see her. She's having no pain I do feel like the infection is completely resolved which is excellent news. No fevers, chills, nausea, or vomiting noted at  this time. 07/28/18 on evaluation today patient appears to be doing very well in regard to her left lower [employer]. Everything seems to be showing signs of improvement which is excellent news. Overall very pleased with the progress that has been made. Fortunately there's no evidence of active infection at this time also excellent news. 08/04/18 on evaluation today patient appears to be doing more poorly in regard to her bilateral lower extremities. She has two new areas open up on the right and these were completely closed as of last week. She still has the two spots on the left which in my pinion seem to be doing better. Fortunately there's no evidence of infection again at this point. 08/11/18 on evaluation today patient actually appears to be doing very well in regard to her bilateral lower Trinity wounds that all seem to be doing better and are measures smaller today. Fortunately there's no signs of infection. No fevers, chills, nausea, or vomiting noted at this time. 08/18/18 on evaluation today patient actually appears to be doing about the same inverter bilateral lower extremities. She continues to have areas that blistering open as was drain that fortunately nothing too significant. Overall I feel like Drawtex may have done better for her however compared to the collagen. 08/25/18 on evaluation today patient appears to be doing a little bit more poorly today even compared to last time I saw her. Again I'm not exactly sure why she's making worse progress over the past several weeks. I'm beginning to wonder if there is some kind of underlying low level infection causing this issue. I did actually take a culture from the left anterior lower extremity but it was a new wound draining quite a bit at this point. Unfortunately she also seems to be having more pain which is what also makes me worried about the possibility of infection. This is despite never erythema noted at this point. 09/01/18 on  evaluation today patient actually appears to be doing a little worse even compared to last week in regard to bilateral lower extremities. She did go to the hospital on the 16th was given a dose of IV Zosyn and then discharged with a recommendation to continue with the Bactrim that I previously prescribed for her. Nonetheless she is still having a lot of discomfort she tells me as well at this time. This is definitely  unfortunate. No fevers, chills, nausea, or vomiting noted at this time. 09/08/18 on evaluation today patient's bilateral lower extremities actually appear to be shown signs of improvement which is good news. Fortunately there does not appear to be any signs of active infection I think the anabiotic is helping in this regard. Overall I'm very pleased with how she is progressing. 09/15/18 patient was actually seen in ER yesterday due to her legs as well unfortunately. She states she's been having a lot of pain and discomfort as well as a lot of drainage. Upon inspection today the patient does have a lot of swelling and drainage I feel like this is more related to lymphedema and poor fluid control than it is to infection based on what I'm seeing. The physician in the emergency department also doubted that the patient was having a significant infection nonetheless I see no evidence of infection obvious at this point although I do see evidence of poor fluid control. She still not using a compression pumps, she is not elevating due to her lift chair as well as her hospital bed being broken, and she really is not keeping her legs up as much as they should be and also has been taking off her wraps. All this combined I think has led to poor fluid control and to be honest she may be somewhat volume overloaded in general as well. I recommend that she may need to contact your physician to see if a prescription for a diuretic would be beneficial in their opinion. As long as this is safe I think it would  likely help her. 09/29/18 on evaluation today patient's left lower extremity actually appears to be doing quite a bit better. At least compared to last time that I saw her. She still has a large area where she is draining from but there's a lot of new skin speckled trout and in fact there's more new skin that there are open areas of weeping and drainage at this point. This is good news. With regard to the right lower extremity this is doing much better with the only open area that I really see being a dry spot on the right lateral ankle currently. Fortunately there's no signs of active infection at this time which is good news. No fevers, chills, nausea, or vomiting noted at this time. The patient seems somewhat stressed and overwhelmed during the visit today she was very lethargic as such. She does and she is not taking any pain medications at this point. Apparently according to her husband are also in the process of moving which is probably taking its toll on her as well. 10/06/18 on evaluation today patient appears to be doing rather well in regard to her lower extremities compared to last evaluation. Fortunately there's no signs of active infection. She tells me she did have an appointment with her primary. Nonetheless he was concerned that the wounds were somewhat deep based on pictures but we never actually saw her legs. She states that he had her somewhat worried due to the fact that she was fearing now that she was [place] have to have an amputation. With that being said based on what I'm seeing check she looks better this week that she has the last two times I've seen her with much less drainage I'm actually pleased in this regard. That doesn't mean that she's out of the water but again I do not think what the point of talking about education at all in regard to  her leg. She is very happy to hear this. She is also not having as much pain as she was having last week. 10/13/18 unfortunately on  evaluation today patient still continues to have a significant amount of drainage she's not letting home health actually apply the compression dressings at this point. She's trying to use of Juxta-Lite of the top of Kerlex and the second layer of the three layer compression wrap. With that being said she just does not seem to be making as good a progress as I would expect if she was having the compression applied and in place on a regular basis. No fevers, chills, nausea, or vomiting noted at this time. 10/20/18 on evaluation today patient appears to be doing a little better in regard to her bilateral lower extremity ulcers. In fact the right lower extremity seems to be healed she doesn't even have any openings at this point left lower extremity though still somewhat macerated seems to be showing signs of new skin growth at multiple locations throughout. Fortunately there's no evidence of active infection at this time. No fevers, chills, nausea, or vomiting noted at this time. 10/27/18 on evaluation today patient appears to be doing much better in regard to her left lower Trinity ulcer. She's been tolerating the laptop complication and has minimal drainage noted at this point. Fortunately there's no signs of active infection at this time. No fevers, chills, nausea, or vomiting noted at this time. 11/03/18 on evaluation today patient actually appears to be doing excellent in regard to her left lower extremity. She is having very little drainage at this point there does not appear to be any significant signs of infection overall very pleased with how things have gone. She is likewise extremely pleased still and seems to be making wonderful progress week to week. I do believe antibiotics were helpful for her. Her primary care provider did place on amateur clean since I last saw her. 11/17/18 on evaluation today patient appears to be doing worse in regard to her bilateral lower extremities at this point. She is  been tolerating the dressing changes without complication. With that being said she typically takes the [place] off fairly quickly upon arriving home even after being seen here in the clinic and does not allow home health reapply command as part of the dressing at home. Therefore she said no compression essentially since I last saw her as best I can tell. With that being said I think it shows and how much swelling she has in the open wounds that are noted at this point. Fortunately there's no signs of infection but unfortunately if she doesn't get this under control I think she will end up with infection and more significant issues. 11/24/18 on evaluation today patient actually appears to be doing somewhat better in regard to her bilateral lower extremities. 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 Nonetheless that is doing better currently so she should be able to attempt reinitiating the lymphedema pumps at this time. No fevers, chills, nausea, or vomiting noted at this time. 12/01/18 upon evaluation today patient's left lower extremity appears to be doing quite well unfortunately her right lower extremity is not doing nearly as well. She has been tolerating the dressing changes without complication unfortunately she did not keep a wrap on the right  at this time. Nonetheless I believe this has led to increased swelling and weeping in the world is actually much larger than during the last evaluation with her. 12/08/18 on evaluation today patient appears to be doing about the same at this point in regard to her right lower extremity. There is some more palatable to touch I'm concerned about the possibility of there being some infection although I think the main issue is she's not keeping her compression wrap on which in turn is not allowing this area to heal  appropriately. 12/22/18 on evaluation today patient appears to be doing better in regard to left lower extremity unfortunately significantly worse in regard to the right lower extremity. The areas of blistering and necrotic superficial tissue have spread and again this does not really appear to be signs of infection and all she just doesn't seem to be doing nearly as well is what she has been in the past. Overall I feel like the Augmentin did absolutely nothing for her she doesn't seem to have any infection again I really didn't think so last time either is more of a potential preventative measure and hoping that this would make some difference but I think the main issue is she's not wearing her compression. She tells me she cannot wear the [place] been we put on she takes it off pretty much upon getting home. Subsequently she worshiped Juxta-Lite when I questioned her about how often she wears it this is no more than three hours a day obviously that leaves 21 hours that she has no compression and this is obviously not doing well for her. Overall I'm concerned that if things continue to worsen she is at great risk of both infection as well as losing her leg. 01/05/19 on evaluation today patient appears to be doing well in regard to her left lower extremity which he is allowing [place] to wrap and not so well with regard to her right lower extremity which she is not allowing [place] to really wrap and keep the wrap on. She states that it hurts too badly whenever it's wrapped and she ends up having to take it off. She's been using the Juxta-Lite she tells me up to six hours a day although I question whether or not that's really been the case to be honest. Previously she told me three hours today nonetheless obviously the legs as long as the wrap is doing great when she is not is doing much more poorly. 01/12/2019 on evaluation today patient actually appears to be doing a little better in my opinion with regard to her  right lower extremity ulcer. She has a small open area on the left lower extremity unfortunately but again this I think is part of the normal fluctuation of what she is going to have to expect with regard to her legs especially when she is not using her lymphedema pumps on a regular basis. Subsequently based on what I am seeing today I think that she does seem to be doing slightly better with regard to her right lower extremity she did see her primary care provider on Monday they felt she had an infection and placed her on 2 antibiotics. Both Cipro and clindamycin. Subsequently again she seems possibly to be doing a little bit better in regards to the right lower extremity she also tells me however she has been wearing the compression wrap over the past week since I spoke with her as well that is a Kerlix and Coban wrap  on the right. No fevers, chills, nausea, vomiting, or diarrhea. 01/19/2019 on evaluation today patient appears to be doing better with regard to her bilateral lower extremities especially the right. I feel like the compression has been beneficial for her which is great news. She did get a call from her primary care provider on her way here today telling her that she did have methicillin- resistant Staphylococcus aureus and he was calling in a couple new antibiotics for her including a ointment to be applied she tells me 3 times a day. With that being said this sounds like likely to be Bactroban which I think could be applied with each dressing/wrap change but I would not be able to accommodate her applying this 3 times a day. She is in agreement with the least doing this we will add that to her orders today. 01/26/2019 on evaluation today patient actually appears to be doing much better with regard to her right lower extremity. Her left lower extremity is also doing quite well all things considering. Fortunately there is no evidence of active infection at this time. No fevers, chills,  nausea, vomiting, or diarrhea. 02/02/2019 on evaluation today patient appears to be doing much better compared to her last evaluation. Little by little off like her right leg is returning more towards normal. There does not appear to be any signs of active infection and overall she seems to be doing quite well which is great news. I am very pleased in this regard. No fevers, chills, nausea, vomiting, or diarrhea. 02/09/2019 upon evaluation today patient appears to be doing better with regard to her bilateral lower extremities. She has been tolerating the dressing changes without complication. Fortunately there is no signs of active infection at this time. No fevers, chills, nausea, vomiting, or diarrhea. 02/23/2019 on evaluation today patient actually appears to be doing quite well with regard to her bilateral lower extremities. She has been tolerating the dressing changes without complication. She is even used her pumps one time and states that she really felt like it felt good. With that being said she seems to be in good spirits and her legs appear to be doing excellent. 03/09/2019 on evaluation today patient appears to be doing well with regard to her right lower extremity there are no open wounds at this time she is having some discomfort but I feel like this is more neuropathy than anything. With regard to her left lower extremity she had several areas scattered around that she does have some weeping and drainage from but again overall she does not appear to be having any significant issues and no evidence of infection at this time which is good news. 03/23/2019 on evaluation today patient appears to be doing well with regard to her right lower extremity which she tells me is still close she is using her juxta light here. Her left lower extremity she mainly just has an area on the foot which is still slightly draining although this also is doing great. Overall very pleased at this time. 04/06/2019  patient appears to be doing a little bit worse in regard to her left lower extremity upon evaluation today. She feels like this could be becoming infected again which she had issues with previous. Fortunately there is no signs of systemic infection but again this is always a struggle with her with her legs she will go from doing well to not so well in a very short amount of time. 04/20/2019 on evaluation today patient actually appears  to be doing quite well with regard to her right lower extremity I do not see any signs of active infection at this time. Fortunately there is no fever chills noted. She is still taking the antibiotics which I prescribed for her at this point. In regard to the left lower extremity I do feel like some of these areas are better although again she still is having weeping from several locations at this time. 04/27/2019 on evaluation today patient appears to be doing about the same if not slightly worse in regard to her left lower extremity ulcers. She tells me when questioned that she has been sleeping in her Hoveround chair in fact she tells me she falls asleep without even knowing it. I think she is spending a whole lot of time in the chair and less time walking and moving around which is not good for her legs either. On top of that she is in a seated position which is also the worst position she is not really elevating her legs and she is also not using her lymphedema pumps. All this is good to contribute to worsening of her condition in general. 05/18/2019 on evaluation today patient appears to be doing well with regard to her lower extremity on the right in fact this is showing no signs of any open wounds at this time. On the left she is continuing to have issues with areas that do drain. Some of the regions have healed and there are couple areas that have reopened. She did go to the ER per the patient according to recommendations from the home health nurse due to what she  was seen when she came out on 05/13/2019. Subsequently she felt like the patient needed to go to the hospital due to the fact that again she was having "milky white discharge" from her leg. Nonetheless she had and then was placed on doxycycline and subsequently seems to be doing better. 06/01/2019 upon evaluation today patient appears to be doing really in my opinion about the same. I do not see any signs of active infection which is good news. Overall she still has wounds over the bilateral lower extremities she has reopened on the right but this appears to be more of a crack where there is weeping/edema coming from the region. I do not see any evidence of infection at either site based on what I visualized today. 07/13/2019 upon evaluation today patient appears to be doing a little worse compared to last time I saw her. She since has been in the hospital from 06/21/2019 through 06/29/2019. This was secondary to having Covid. During that time they did apply lotion to her legs which unfortunately has caused her to develop a myriad of open wounds on her lower extremities. Her legs do appear to be doing better as far as the overall appearance is concerned but nonetheless she does have more open and weeping areas. 07/27/2019 upon evaluation today patient appears to be doing more poorly to be honest in regard to her left lower extremity in particular. There is no signs of systemic infection although I do believe she may have local infection. She notes she has been having a lot of blue/green drainage which is consistent potentially with Pseudomonas. That may be something that we need to consider here as well. The doxycycline does not seem to have been helping. 08/03/2019 upon evaluation today patient appears to be doing a little better in my opinion compared to last week's evaluation. Her culture I did  review today and she is on appropriate medications to help treat the Enterobacter that was noted. Overall I  feel like that is good news. With that being said she is unfortunately continuing to have a lot of drainage and though it is doing better I still think she has a long ways to go to get things dried up in general. Fortunately there is no signs of systemic infection. 08/10/2019 upon evaluation today patient appears to be doing may be slightly better in regard to her left lower extremity the right lower extremity is doing much better. Fortunately there is no signs of infection right now which is good news. No fevers, chills, nausea, vomiting, or diarrhea. 08/24/2019 on evaluation today patient appears to be doing slightly better in regard to her lower extremities. The left lower extremity seems to be healed the right lower extremity is doing better though not completely healed as far as the openings are concerned. She has some generalized issues here with edema and weeping secondary to her lymphedema though again I do believe this is little bit drier compared to prior weeks evaluations. In general I am very pleased with how things seem to be progressing. No fevers, chills, nausea, vomiting, or diarrhea. 08/31/2019 upon evaluation today patient actually seems to making some progress here with regard to the left lower extremity in particular. She has been tolerating the dressing changes without complication. Fortunately there is no signs of active infection at this time. No fevers, chills, nausea, vomiting, or diarrhea. She did see Dr. Doren Custard and he did note that she did have a issue with the left great saphenous vein and the small saphenous vein in the leg. With that being said he was concerned about the possibility of laser ablation not being extremely successful. He also mentioned a small risk of DVT associated with the procedure. However if the wounds do not continue to improve he stated that that would probably be the way to go. Fortunately the patient's legs do seem to be doing much better. 09/07/2019  upon evaluation today patient appears to be doing better with regard to her lower extremities. She has been tolerating the dressing changes without complication. With that being said she is showing signs of improvement and overall very pleased. There are some areas on her leg that I think we do need to debride we discussed this last week the patient is in agreement with doing that as long as it does not hurt too badly. 09/14/2019 upon evaluation today patient appears to be doing decently well with regard to her left lower extremity. She is not having near as much weeping as she has had in the past things seem to be drying up which is good news. There is no signs of active infection at this time. 09/21/19 upon evaluation today patient appears to be doing better in regard overall to her bilateral lower extremities. She again has less open than she did previous and each week I feel like this is getting better. Fortunately there is no signs of active infection at this time. No fevers, chills, nausea, vomiting, or diarrhea. 09/28/2019 upon evaluation today patient actually appears to be showing signs of improvement with regard to her left lower extremity. Unfortunately the right medial lower extremity around the ankle region has reopened to some degree but this appears to be minimal still which is good news. There is no signs of active infection at this time which is also good news. 10/12/2019 upon evaluation today patient appears to  be doing okay with regard to her bilateral lower extremities today. The right is a little bit worse then last evaluation 2 weeks ago. The left is actually doing a little better in my opinion. Overall there is no signs of active infection at this time that I see. Obviously that something we have to keep a close eye on she is very prone to this with the significant and multiple openings that she has over the bilateral lower extremities. 10/19/2019 upon evaluation today patient appears  to be doing about the best that I have seen her in quite some time. She has been tolerating the dressing changes without complication. There does not appear to be any signs of active infection and overall I am extremely happy with the way her legs appeared. She is drying up quite nicely and overall is having less pain. 11/09/2019 upon evaluation today patient appears to be doing better in regard to her wounds. She seems to be drying up more and more each time I see her this is just taking a very long time. Fortunately there is no signs of active infection at this time. 11/23/2019 upon evaluation today patient actually appears to be doing excellent in regard to her lower extremities at this point compared to where she has been. Fortunately there is no signs of active infection at this time. She did go to the hospital last week for nausea and vomiting completely unrelated to her wounds. Fortunately she is doing better she was given some Reglan and got better. She had associated abdominal pain but they never found out what was going on. 12/07/2019 upon evaluation today patient appears to be doing well for the most part in regard to her legs. She unfortunately has not been keeping the Coban portion of her wraps on therefore the compression has not really been sufficient for what it is supposed to be. Nonetheless she tells me that it just hurt too bad therefore she removed it. Objective Constitutional Well-nourished and well-hydrated in no acute distress. Vitals Time Taken: 10:20 AM, Height: 62 in, Weight: 335 lbs, BMI: 61.3, Temperature: 98.3 F, Pulse: 84 bpm, Respiratory Rate: 16 breaths/min, Blood Pressure: 176/63 mmHg. Respiratory normal breathing without difficulty. Psychiatric this patient is able to make decisions and demonstrates good insight into disease process. Alert and Oriented x 3. pleasant and cooperative. General Notes: Patient's wounds currently are showing signs of some improvement she  does not have as many wide open areas that she did previous and overall I feel like this is an improvement. Fortunately there is no signs of active infection at this time. No fevers, chills, nausea, vomiting, or diarrhea. Integumentary (Hair, Skin) Wound #61 status is Open. Original cause of wound was Gradually Appeared. The wound is located on the Left,Circumferential Lower Leg. The wound measures 11.5cm length x 30cm width x 0.1cm depth; 270.962cm^2 area and 27.096cm^3 volume. There is Fat Layer (Subcutaneous Tissue) Exposed exposed. There is no tunneling or undermining noted. There is a large amount of serous drainage noted. The wound margin is flat and intact. There is medium (34-66%) pink, pale granulation within the wound bed. There is a medium (34-66%) amount of necrotic tissue within the wound bed including Adherent Slough. Wound #64 status is Open. Original cause of wound was Gradually Appeared. The wound is located on the Left,Dorsal Foot. The wound measures 6.5cm length x 6.5cm width x 0.1cm depth; 33.183cm^2 area and 3.318cm^3 volume. There is Fat Layer (Subcutaneous Tissue) Exposed exposed. There is no tunneling or undermining  noted. There is a medium amount of serous drainage noted. The wound margin is flat and intact. There is large (67-100%) pink, pale granulation within the wound bed. There is a small (1-33%) amount of necrotic tissue within the wound bed including Adherent Slough. Wound #65 status is Open. Original cause of wound was Gradually Appeared. The wound is located on the Right,Medial Lower Leg. The wound measures 1.3cm length x 1.3cm width x 0.1cm depth; 1.327cm^2 area and 0.133cm^3 volume. There is Fat Layer (Subcutaneous Tissue) Exposed exposed. There is no tunneling or undermining noted. There is a medium amount of serosanguineous drainage noted. The wound margin is distinct with the outline attached to the wound base. There is large (67-100%) pink granulation within the  wound bed. There is a small (1-33%) amount of necrotic tissue within the wound bed including Adherent Slough. 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 There was a Three Layer Compression Therapy Procedure by Carlene Coria, RN. Post procedure Diagnosis Wound #61: Same as Pre-Procedure Wound #65 Pre-procedure diagnosis of Wound #65 is a Venous Leg Ulcer located on the Right,Medial Lower Leg . There was a Three Layer Compression Therapy Procedure by Carlene Coria, RN. Post procedure Diagnosis Wound #65: Same as Pre-Procedure Plan Follow-up Appointments: Return Appointment in 2 weeks. Dressing Change Frequency: Wound #61 Left,Circumferential Lower Leg: Change dressing three times week. Wound #64 Left,Dorsal Foot: Change dressing three times week. Wound #65 Right,Medial Lower Leg: Change dressing three times week. Skin Barriers/Peri-Wound Care: Barrier cream - zinc oxide cream to any macerated areas Moisturizing lotion - to dry skin Wound Cleansing: Clean wound with Wound Cleanser - all wounds May shower with protection. Primary Wound Dressing: Wound #61 Left,Circumferential Lower Leg: Calcium Alginate with Silver Wound #64 Left,Dorsal  Foot: Calcium Alginate with Silver Wound #65 Right,Medial Lower Leg: Calcium Alginate with Silver Secondary Dressing: Wound #61 Left,Circumferential Lower Leg: Dry Gauze - all wounds ABD pad - as needed Wound #64 Left,Dorsal Foot: Dry Gauze - all wounds ABD pad - as needed Wound #65 Right,Medial Lower Leg: Dry Gauze Edema Control: 3 Layer Compression System - Bilateral Avoid standing for long periods of time - walking is encouraged Elevate legs to the level of the heart or above for 30 minutes daily and/or when sitting, a frequency of: - do not sleep in chair with feet dangling, MUST elevate legs while sitting Exercise regularly Segmental Compressive Device. - lymphedema pumps 60 minutes 1- 2 times per day Off-Loading: Turn and reposition every 2 hours Additional Orders / Instructions: Follow Nutritious Diet - T include vitamin A, vitamin C, and Zinc along with increased protein intake. o Home Health: [facility] for [facility]. - [facility]. I would recommend currently that we continue with the compression wraps she needs to keep these in place however if they can be effective. 2. Also recommend that she  needs to be using her lymphedema pumps on a regular basis if she does not use them she is not can derive the benefit obviously though this seems like an obvious thing to both her and myself it still is very important if she wants to get her legs better. 3. I am also going to suggest she is to elevate her legs including sleep in her bed with her legs elevated as well. We will see patient back for reevaluation in 2 weeks here in the clinic. If anything worsens or changes patient will contact our office for additional recommendations. Electronic Signature(s) Signed: 12/07/2019 10:51:40 AM By: [name] PA-C Entered By: [name] on 12/07/2019 10:51:40 -------------------------------------------------------------------------------- SuperBill  Details Patient Name: Date of Service: [name] 12/07/2019 Medical Record Number: [id number] Patient Account Number: [id number] Date of Birth/Sex: Treating RN: 02/25/1951 (69 y.o. [name] Primary Care Provider: [name] Other Clinician: Referring Provider: Treating Provider/Extender: Doyle Askew, FRED Weeks in Treatment: 122 Diagnosis Coding ICD-10 Codes Code Description E11.622 Type 2 diabetes mellitus with other skin ulcer I89.0 Lymphedema, not elsewhere classified I87.331 Chronic venous hypertension (idiopathic) with ulcer and inflammation of right lower extremity I87.332 Chronic venous hypertension (idiopathic) with ulcer and inflammation of left lower extremity L97.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 leg with fat layer exposed L97.822 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 I10 Essential (primary) hypertension E66.01 Morbid (severe) obesity due to excess calories F41.8 Other specified anxiety disorders R53.1 Weakness Facility Procedures The patient participates with Medicare or their insurance follows the Medicare Facility Guidelines: CPT4 Description Modifier Quantity Code [id number] BILATERAL: Application of multi-layer venous compression system; leg (below knee), including ankle and 1 foot. Physician Procedures : CPT4 Code Description Modifier [id number] - WC PHYS LEVEL 3 - EST PT ICD-10 Diagnosis Description E11.622 Type 2 diabetes mellitus with other skin ulcer I87.331 Chronic venous hypertension (idiopathic) with ulcer and inflammation of right lower  extremity I89.0 Lymphedema, not elsewhere classified I87.332 Chronic venous hypertension (idiopathic) with ulcer and inflammation of left lower extremity Quantity: 1 Electronic Signature(s) Signed: 12/07/2019 10:51:54 AM By: [name] PA-C Entered By: [name] on 12/07/2019 10:51:52

## 2019-12-07 NOTE — Progress Notes (Addendum)
[name], [name] ([id number]) Visit Report for 12/07/2019 Arrival Information Details Patient Name: Date of Service: [name], [name] 12/07/2019 10:00 A M Medical Record Number: [id number] Patient Account Number: [id number] Date of Birth/Sex: Treating RN: 1951-04-18 (69 y.o. [name], Meta.Reding Primary Care Callie Bunyard: [name] Other Clinician: Referring Tracyann Duffell: Treating Tylesha Gibeault/Extender: Doyle Askew, FRED Weeks in Treatment: 15 Visit Information History Since Last Visit Added or deleted any medications: No Patient Arrived: Wheel Chair Any new allergies or adverse reactions: No Arrival Time: 10:18 Had a fall or experienced change in No Accompanied By: self activities of daily living that may affect Transfer Assistance: None risk of falls: Patient Identification Verified: Yes Signs or symptoms of abuse/neglect since last visito No Secondary Verification Process Completed: Yes Hospitalized since last visit: No Patient Requires Transmission-Based Precautions: No Implantable device outside of the clinic excluding No Patient Has Alerts: Yes cellular tissue based products placed in the center Patient Alerts: R ABI= 1.01 since last visit: L ABI = .99 Has Dressing in Place as Prescribed: Yes Has Compression in Place as Prescribed: No Pain Present Now: Yes Notes removed compression wrap Monday per patient. Electronic Signature(s) Signed: 12/07/2019 4:53:33 PM By: Deon Pilling Entered By: Deon Pilling on 12/07/2019 10:29:04 -------------------------------------------------------------------------------- Compression Therapy Details Patient Name: Date of Service: [name]. 12/07/2019 10:00 A M Medical Record Number: [id number] Patient Account Number: [id number] Date of Birth/Sex: Treating RN: 05-07-1951 (69 y.o. [name] Primary Care Brownie Nehme: [name] Other Clinician: Referring Nashanti Duquette: Treating Davena Julian/Extender: Doyle Askew,  FRED Weeks in Treatment: 122 Compression Therapy Performed for Wound Assessment: Wound #61 Left,Circumferential Lower Leg Performed By: Clinician Carlene Coria, RN Compression Type: Three Layer Post Procedure Diagnosis Same as Pre-procedure Electronic Signature(s) Signed: 12/07/2019 4:25:30 PM By: [name] RN, BSN Entered By: [name] on 12/07/2019 10:40:31 -------------------------------------------------------------------------------- Compression Therapy Details Patient Name: Date of Service: [name]. 12/07/2019 10:00 A M Medical Record Number: [id number] Patient Account Number: [id number] Date of Birth/Sex: Treating RN: 07-Mar-1951 (69 y.o. [name] Primary Care Reeya Bound: [name] Other Clinician: Referring Dianah Pruett: Treating Jaqua Ching/Extender: Doyle Askew, FRED Weeks in Treatment: 122 Compression Therapy Performed for Wound Assessment: Wound #65 Right,Medial Lower Leg Performed By: Clinician Carlene Coria, RN Compression Type: Three Layer Post Procedure Diagnosis Same as Pre-procedure Electronic Signature(s) Signed: 12/07/2019 4:25:30 PM By: [name] RN, BSN Entered By: [name] on 12/07/2019 10:40:31 -------------------------------------------------------------------------------- Encounter Discharge Information Details Patient Name: Date of Service: [name]. 12/07/2019 10:00 A M Medical Record Number: [id number] Patient Account Number: [id number] Date of Birth/Sex: Treating RN: 06/21/1950 (69 y.o. [name] Primary Care Nautica Hotz: [name] Other Clinician: Referring Briseida Gittings: Treating Kallista Pae/Extender: Doyle Askew, FRED Weeks in Treatment: [phone] Encounter Discharge Information Items Discharge Condition: Stable Ambulatory Status: Walker Discharge Destination: Home Transportation: Private Auto Accompanied By: self Schedule Follow-up Appointment: Yes Clinical Summary of Care: Patient  Declined Electronic Signature(s) Signed: 12/07/2019 4:51:47 PM By: Carlene Coria RN Entered By: Carlene Coria on 12/07/2019 11:14:58 -------------------------------------------------------------------------------- Lower Extremity Assessment Details Patient Name: Date of Service: [name], [name] 12/07/2019 10:00 A M Medical Record Number: [id number] Patient Account Number: [id number] Date of Birth/Sex: Treating RN: 07-06-1950 (69 y.o. [name] Primary Care Abdimalik Mayorquin: [name] Other Clinician: Referring Shamarie Call: Treating Jaskiran Pata/Extender: Doyle Askew, FRED Weeks in Treatment: 122 Edema Assessment Assessed: [Left: Yes] [Right: Yes] Edema: [Left: Yes] [Right: Yes] Calf Left: Right: Point of Measurement: 37 cm From Medial Instep 41 cm 38 cm  Ankle Left: Right: Point of Measurement: 10 cm From Medial Instep 30 cm 23 cm Vascular Assessment Pulses: Dorsalis Pedis Palpable: [Left:No] [Right:No] Electronic Signature(s) Signed: 12/07/2019 4:53:33 PM By: Deon Pilling Entered By: Deon Pilling on 12/07/2019 10:30:18 -------------------------------------------------------------------------------- [place] Details Patient Name: Date of Service: [name]. 12/07/2019 10:00 A M Medical Record Number: [id number] Patient Account Number: [id number] Date of Birth/Sex: Treating RN: November 18, 1950 (69 y.o. [name] Primary Care Lakeita Panther: [name] Other Clinician: Referring Emmanual Gauthreaux: Treating Niara Bunker/Extender: Doyle Askew, FRED Weeks in Treatment: [phone] Active Inactive Venous Leg Ulcer Nursing Diagnoses: Actual venous Insuffiency (use after diagnosis is confirmed) Knowledge deficit related to disease process and management Goals: Patient will maintain optimal edema control Date Initiated: 08/12/2017 Target Resolution Date: 12/21/2019 Goal Status: Active Patient/caregiver will verbalize understanding of disease process and  disease management Date Initiated: 08/12/2017 Date Inactivated: 04/21/2018 Target Resolution Date: 04/24/2018 Goal Status: Met Interventions: Assess peripheral edema status every visit. Compression as ordered Treatment Activities: Therapeutic compression applied : 08/12/2017 Notes: Wound/Skin Impairment Nursing Diagnoses: Impaired tissue integrity Knowledge deficit related to ulceration/compromised skin integrity Goals: Patient/caregiver will verbalize understanding of skin care regimen Date Initiated: 08/12/2017 Target Resolution Date: 12/21/2019 Goal Status: Active Ulcer/skin breakdown will have a volume reduction of 30% by week 4 Date Initiated: 08/05/2017 Date Inactivated: 09/30/2017 Target Resolution Date: 10/03/2017 Goal Status: Met Ulcer/skin breakdown will have a volume reduction of 50% by week 8 Date Initiated: 09/30/2017 Date Inactivated: 10/28/2017 Target Resolution Date: 10/28/2017 Goal Status: Met Interventions: Assess patient/caregiver ability to perform ulcer/skin care regimen upon admission and as needed Assess ulceration(s) every visit Provide education on ulcer and skin care Screen for HBO Treatment Activities: Patient referred to home care : 08/05/2017 Skin care regimen initiated : 08/05/2017 Topical wound management initiated : 08/05/2017 Notes: Electronic Signature(s) Signed: 12/07/2019 4:25:30 PM By: [name] RN, BSN Entered By: [name] on 12/07/2019 10:38:01 -------------------------------------------------------------------------------- Pain Assessment Details Patient Name: Date of Service: [name]. 12/07/2019 10:00 A M Medical Record Number: [id number] Patient Account Number: [id number] Date of Birth/Sex: Treating RN: 07-Feb-1951 (69 y.o. [name] Primary Care Markcus Lazenby: [name] Other Clinician: Referring Ashaunti Treptow: Treating Dorotha Hirschi/Extender: Doyle Askew, FRED Weeks in Treatment: 122 Active Problems Location  of Pain Severity and Description of Pain Patient Has Paino Yes Site Locations Pain Location: Generalized Pain, Pain in Ulcers Rate the pain. Current Pain Level: 7 Worst Pain Level: 10 Least Pain Level: 0 Tolerable Pain Level: 8 Character of Pain Describe the Pain: Aching, Shooting Pain Management and Medication Current Pain Management: Medication: Yes Cold Application: No Rest: Yes Massage: No Activity: No T.E.N.S.: No Heat Application: No Leg drop or elevation: No Is the Current Pain Management Adequate: Adequate How does your wound impact your activities of daily livingo Sleep: No Bathing: No Appetite: No Relationship With Others: No Bladder Continence: No Emotions: No Bowel Continence: No Work: No Toileting: No Drive: No Dressing: No Hobbies: No Notes patient c/o of nerve pain in toes. Electronic Signature(s) Signed: 12/07/2019 4:53:33 PM By: Deon Pilling Entered By: Deon Pilling on 12/07/2019 10:29:54 -------------------------------------------------------------------------------- Patient/Caregiver Education Details Patient Name: Date of Service: [name], [name] 6/23/2021andnbsp10:00 [place] Record Number: [id number] Patient Account Number: [id number] Date of Birth/Gender: Treating RN: [birth year] (70 y.o. [name] Primary Care Physician: [name] Other Clinician: Referring Physician: Treating Physician/Extender: Doyle Askew, FRED Weeks in Treatment: 122 Education Assessment Education Provided To: Patient Education Topics Provided Venous: Methods: Explain/Verbal Responses: Reinforcements needed,  State content correctly Wound/Skin Impairment: Methods: Explain/Verbal Responses: Reinforcements needed, State content correctly Electronic Signature(s) Signed: 12/07/2019 4:25:30 PM By: [name] RN, BSN Entered By: [name] on 12/07/2019  10:38:27 -------------------------------------------------------------------------------- Wound Assessment Details Patient Name: Date of Service: [name]. 12/07/2019 10:00 A M Medical Record Number: [id number] Patient Account Number: [id number] Date of Birth/Sex: Treating RN: Aug 05, 1950 (69 y.o. [name] Primary Care Naphtali Riede: [name] Other Clinician: Referring Raymondo Garcialopez: Treating Trine Fread/Extender: Doyle Askew, FRED Weeks in Treatment: 122 Wound Status Wound Number: 61 Primary Venous Leg Ulcer Etiology: Wound Location: Left, Circumferential Lower Leg Wound Open Wounding Event: Gradually Appeared Status: Date Acquired: 04/20/2019 Comorbid Asthma, Hypertension, Peripheral Arterial Disease, Peripheral Weeks Of Treatment: 33 History: Venous Disease, Type II Diabetes, Gout, Osteoarthritis Clustered Wound: Yes Photos Wound Measurements Length: (cm) 11.5 Width: (cm) 30 Depth: (cm) 0.1 Clustered Quantity: 3 Area: (cm) 270.962 Volume: (cm) 27.096 % Reduction in Area: -3493.7% % Reduction in Volume: -3493.6% Epithelialization: Medium (34-66%) Tunneling: No Undermining: No Wound Description Classification: Full Thickness Without Exposed Support Stru Wound Margin: Flat and Intact Exudate Amount: Large Exudate Type: Serous Exudate Color: amber ctures Foul Odor After Cleansing: No Slough/Fibrino Yes Wound Bed Granulation Amount: Medium (34-66%) Exposed Structure Granulation Quality: Pink, Pale Fascia Exposed: No Necrotic Amount: Medium (34-66%) Fat Layer (Subcutaneous Tissue) Exposed: Yes Necrotic Quality: Adherent Slough Tendon Exposed: No Muscle Exposed: No Joint Exposed: No Bone Exposed: No Treatment Notes Wound #61 (Left, Circumferential Lower Leg) 1. Cleanse With Wound Cleanser Soap and water 2. Periwound Care Barrier cream 3. Primary Dressing Applied Calcium Alginate Ag 4. Secondary Dressing ABD Pad Dry Gauze 6. Support Layer  Applied 4 layer compression wrap Electronic Signature(s) Signed: 12/08/2019 5:34:37 PM By: Deon Pilling Signed: 12/09/2019 5:12:49 PM By: Minerva Fester Previous Signature: 12/07/2019 4:53:33 PM Version By: Deon Pilling Entered By: Minerva Fester on 12/08/2019 15:29:03 -------------------------------------------------------------------------------- Wound Assessment Details Patient Name: Date of Service: [name]. 12/07/2019 10:00 A M Medical Record Number: [id number] Patient Account Number: [id number] Date of Birth/Sex: Treating RN: May 29, 1951 (69 y.o. [name] Primary Care Kutler Vanvranken: [name] Other Clinician: Referring Suvi Archuletta: Treating Canesha Tesfaye/Extender: Doyle Askew, FRED Weeks in Treatment: 122 Wound Status Wound Number: 64 Primary Diabetic Wound/Ulcer of the Lower Extremity Etiology: Wound Location: Left, Dorsal Foot Wound Open Wounding Event: Gradually Appeared Status: Date Acquired: 06/01/2019 Comorbid Asthma, Hypertension, Peripheral Arterial Disease, Peripheral Weeks Of Treatment: 27 History: Venous Disease, Type II Diabetes, Gout, Osteoarthritis Clustered Wound: No Photos Wound Measurements Length: (cm) 6.5 % Reduct Width: (cm) 6.5 % Reduct Depth: (cm) 0.1 Epitheli Area: (cm) 33.183 Tunneli Volume: (cm) 3.318 Undermi ion in Area: -20010.9% ion in Volume: -6671.4% alization: Small (1-33%) ng: No ning: No Wound Description Classification: Grade 1 Foul Od Wound Margin: Flat and Intact Slough/ Exudate Amount: Medium Exudate Type: Serous Exudate Color: amber or After Cleansing: No Fibrino Yes Wound Bed Granulation Amount: Large (67-100%) Exposed Structure Granulation Quality: Pink, Pale Fascia Exposed: No Necrotic Amount: Small (1-33%) Fat Layer (Subcutaneous Tissue) Exposed: Yes Necrotic Quality: Adherent Slough Tendon Exposed: No Muscle Exposed: No Joint Exposed: No Bone Exposed: No Treatment Notes Wound #64 (Left,  Dorsal Foot) 1. Cleanse With Wound Cleanser Soap and water 2. Periwound Care Barrier cream 3. Primary Dressing Applied Calcium Alginate Ag 4. Secondary Dressing ABD Pad Dry Gauze 6. Support Layer Applied 4 layer compression wrap Electronic Signature(s) Signed: 12/08/2019 5:34:37 PM By: Deon Pilling Signed: 12/09/2019 5:12:49 PM By: Minerva Fester Previous Signature: 12/07/2019 4:53:33 PM Version  By: Deon Pilling Entered By: Minerva Fester on 12/08/2019 15:29:45 -------------------------------------------------------------------------------- Wound Assessment Details Patient Name: Date of Service: [name], [name] 12/07/2019 10:00 A M Medical Record Number: [id number] Patient Account Number: [id number] Date of Birth/Sex: Treating RN: 1951-04-29 (69 y.o. [name] Primary Care Lael Wetherbee: [name] Other Clinician: Referring Marley Pakula: Treating Maylynn Orzechowski/Extender: Doyle Askew, FRED Weeks in Treatment: 122 Wound Status Wound Number: 65 Primary Venous Leg Ulcer Etiology: Wound Location: Right, Medial Lower Leg Wound Open Wounding Event: Gradually Appeared Status: Date Acquired: 09/28/2019 Comorbid Asthma, Hypertension, Peripheral Arterial Disease, Peripheral Weeks Of Treatment: 10 History: Venous Disease, Type II Diabetes, Gout, Osteoarthritis Clustered Wound: No Photos Wound Measurements Length: (cm) 1.3 Width: (cm) 1.3 Depth: (cm) 0.1 Area: (cm) 1.327 Volume: (cm) 0.133 % Reduction in Area: -20.6% % Reduction in Volume: -20.9% Epithelialization: Medium (34-66%) Tunneling: No Undermining: No Wound Description Classification: Full Thickness Without Exposed Support Structures Wound Margin: Distinct, outline attached Exudate Amount: Medium Exudate Type: Serosanguineous Exudate Color: red, brown Foul Odor After Cleansing: No Slough/Fibrino Yes Wound Bed Granulation Amount: Large (67-100%) Exposed Structure Granulation Quality: Pink Fascia  Exposed: No Necrotic Amount: Small (1-33%) Fat Layer (Subcutaneous Tissue) Exposed: Yes Necrotic Quality: Adherent Slough Tendon Exposed: No Muscle Exposed: No Joint Exposed: No Bone Exposed: No Treatment Notes Wound #65 (Right, Medial Lower Leg) 1. Cleanse With Wound Cleanser Soap and water 2. Periwound Care Barrier cream 3. Primary Dressing Applied Calcium Alginate Ag 4. Secondary Dressing ABD Pad Dry Gauze 6. Support Layer Applied 4 layer compression wrap Electronic Signature(s) Signed: 12/08/2019 5:34:37 PM By: Deon Pilling Signed: 12/09/2019 5:12:49 PM By: Minerva Fester Previous Signature: 12/07/2019 4:53:33 PM Version By: Deon Pilling Entered By: Minerva Fester on 12/08/2019 15:30:39 -------------------------------------------------------------------------------- Vitals Details Patient Name: Date of Service: [name]. 12/07/2019 10:00 A M Medical Record Number: [id number] Patient Account Number: [id number] Date of Birth/Sex: Treating RN: [birth year] (69 y.o. [name] Primary Care Jude Naclerio: [name] Other Clinician: Referring Asyia Hornung: Treating Drayce Tawil/Extender: Doyle Askew, FRED Weeks in Treatment: 122 Vital Signs Time Taken: 10:20 Temperature (F): 98.3 Height (in): 62 Pulse (bpm): 84 Weight (lbs): 335 Respiratory Rate (breaths/min): 16 Body Mass Index (BMI): 61.3 Blood Pressure (mmHg): 176/63 Reference Range: 80 - 120 mg / dl Electronic Signature(s) Signed: 12/07/2019 4:53:33 PM By: Deon Pilling Entered By: Deon Pilling on 12/07/2019 10:29:25

## 2019-12-21 ENCOUNTER — Encounter (HOSPITAL_BASED_OUTPATIENT_CLINIC_OR_DEPARTMENT_OTHER): Payer: Medicare PPO | Attending: Physician Assistant | Admitting: Physician Assistant

## 2019-12-21 DIAGNOSIS — I87311 Chronic venous hypertension (idiopathic) with ulcer of right lower extremity: Secondary | ICD-10-CM | POA: Diagnosis not present

## 2019-12-21 DIAGNOSIS — I890 Lymphedema, not elsewhere classified: Secondary | ICD-10-CM | POA: Diagnosis not present

## 2019-12-21 DIAGNOSIS — Z6841 Body Mass Index (BMI) 40.0 and over, adult: Secondary | ICD-10-CM | POA: Diagnosis not present

## 2019-12-21 DIAGNOSIS — I69351 Hemiplegia and hemiparesis following cerebral infarction affecting right dominant side: Secondary | ICD-10-CM | POA: Diagnosis not present

## 2019-12-21 DIAGNOSIS — I10 Essential (primary) hypertension: Secondary | ICD-10-CM | POA: Insufficient documentation

## 2019-12-21 DIAGNOSIS — L97522 Non-pressure chronic ulcer of other part of left foot with fat layer exposed: Secondary | ICD-10-CM | POA: Insufficient documentation

## 2019-12-21 DIAGNOSIS — I872 Venous insufficiency (chronic) (peripheral): Secondary | ICD-10-CM | POA: Diagnosis not present

## 2019-12-21 DIAGNOSIS — Z9119 Patient's noncompliance with other medical treatment and regimen: Secondary | ICD-10-CM | POA: Diagnosis not present

## 2019-12-21 DIAGNOSIS — M109 Gout, unspecified: Secondary | ICD-10-CM | POA: Insufficient documentation

## 2019-12-21 DIAGNOSIS — K449 Diaphragmatic hernia without obstruction or gangrene: Secondary | ICD-10-CM | POA: Insufficient documentation

## 2019-12-21 DIAGNOSIS — E1140 Type 2 diabetes mellitus with diabetic neuropathy, unspecified: Secondary | ICD-10-CM | POA: Diagnosis not present

## 2019-12-21 DIAGNOSIS — L97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 leg with fat layer exposed: Secondary | ICD-10-CM | POA: Insufficient documentation

## 2019-12-21 DIAGNOSIS — E11622 Type 2 diabetes mellitus with other skin ulcer: Secondary | ICD-10-CM | POA: Insufficient documentation

## 2019-12-21 DIAGNOSIS — L97822 Non-pressure chronic ulcer of other part of left lower leg with fat layer exposed: Secondary | ICD-10-CM | POA: Diagnosis not present

## 2019-12-21 DIAGNOSIS — J45909 Unspecified asthma, uncomplicated: Secondary | ICD-10-CM | POA: Diagnosis not present

## 2019-12-21 DIAGNOSIS — M1990 Unspecified osteoarthritis, unspecified site: Secondary | ICD-10-CM | POA: Diagnosis not present

## 2019-12-21 DIAGNOSIS — F329 Major depressive disorder, single episode, unspecified: Secondary | ICD-10-CM | POA: Insufficient documentation

## 2019-12-21 DIAGNOSIS — E1151 Type 2 diabetes mellitus with diabetic peripheral angiopathy without gangrene: Secondary | ICD-10-CM | POA: Insufficient documentation

## 2019-12-21 NOTE — Progress Notes (Addendum)
[place]UN[place], [place] ([id number]) Visit Report for 12/21/2019 Chief Complaint Document Details Patient Name: Date of Service: [name], [name] 12/21/2019 10:30 A M Medical Record Number: [id number] Patient Account Number: [id number] Date of Birth/Sex: Treating RN: 1950-11-21 (69 y.o. [name] Primary Care Provider: Dustin [name] Other Clinician: Referring Provider: Treating Provider/Extender: [name], [name] in Treatment: [phone] Information Obtained from: Patient Chief Complaint Bilateral reoccurring LE ulcers Electronic Signature(s) Signed: 12/21/2019 11:05:20 AM By: [name] PA-C Entered By: [name] on 12/21/2019 11:05:19 -------------------------------------------------------------------------------- HPI Details Patient Name: Date of Service: [name]. 12/21/2019 10:30 A M Medical Record Number: [id number] Patient Account Number: [id number] Date of Birth/Sex: Treating RN: [birth year] (69 y.o. [name] Primary Care Provider: Dustin [name] Other Clinician: Referring Provider: Treating Provider/Extender: [name], [name] in Treatment: 124 History of Present Illness HPI Description: this patient has been seen a couple of times before and returns with recurrent problems to her right and left lower extremity with swelling and weeping ulcerations due to not wearing her compression stockings which she had been advised to do during her last discharge, at the end of June 2018. During her last visit the patient had had normal arterial blood flow and her venous reflux study did not necessitate any surgical intervention. She was recommended compression and elevation and wound care. After prolonged treatment the patient was completely healed but she has been noncompliant with wearing or compressions.. She was here last week with an outpatient return visit planned but the patient came in a very poor general condition with altered  mental status and was rushed to the ER on my request. With a history of hypertension, diabetes, TIA and right-sided weakness she was set up for an MRI on her brain and cervical spine and was sent to [facility]. Getting an MRI done was very difficult but once the workup was done she was found not to have any spinal stenosis, epidural abscess or hematoma or discitis. This was radiculopathy to be treated as an outpatient and she was given a follow-up appointment. Today she is feeling much better alert and oriented and has come to reevaluate her bilateral lower extremity lymphedema and ulceration 03/25/2017 -- she was admitted to the hospital on 03/16/2017 and discharged on 03/18/2017 with left leg cellulitis and ulceration. She was started on vancomycin and Zosyn and x-ray showed no bony involvement. She was treated for a cellulitis with IV antibiotics changed to Rocephin and Flagyl and was discharged on oral Keflex and doxycycline to complete a 7 day course. Last hemoglobin A1c was 7.1 and her other ailments including hypertension got asthma were appropriately treated. 05/06/2017 -- she is awaiting the right size of compression stockings from [place] but other than that has been doing well. ====== Old notes 69 year old patient was seen one time last October and was lost to follow-up. She has recurrent problems with weeping and ulceration of her left lower extremity and has swelling of this for several years. It has been worse for the last 2 months. Past medical history is significant for diabetes mellitus type 2, hypertension, gout, morbid obesity, depressive disorders, hiatal hernia, migraines, status post knee surgery, risk of a cholecystectomy, vaginal hysterectomy and breast biopsy. She is not a smoker. As noted before she has never had a venous duplex study and an arterial ABI study was attempted but the left lower extremity was noncompressible 10/01/2016 -- had a lower extremity venous duplex  reflux evaluation which showed  no evidence of deep vein reflux in the right or left lower extremity, and no evidence of great saphenous vein reflux more than 500 ms in the right or left lower extremity, and the left small saphenous vein is incompetent but no vascular consult was recommended. review of her electronic medical records noted that the ABI was checked in July 2017 where the right ABI was normal limits and the left ABI could not be ascertained due to pain with cuff pressure but the waveforms are within normal limits. her arterial duplex study scheduled for April 27. 10/08/2016 -- the patient has various reasons for not having a compression on and for the last 3 days she has had no compression on her left lower extremity either due to pain or the lack of nursing help. She does not use her juxta lites either. 10/15/2016 -- the patient did not keep her appointment for arterial duplex study on April 27 and I have asked her to reschedule this. Her pain is out of proportion with the physical findings and she continuously fails to wear a compression wraps and cuts them off because she says she cannot tolerate the pain. She does not use her juxta lites either. 10/22/2016 -- he has rescheduled her arterial duplex study to May 21 and her pain today is a bit better. She has not been wearing her juxta lites on her right lower extremity but now understands that she needs to do this. She did tolerate the to press compression wrap on her left lower extremity 10/29/2016 --arterial duplex study is scheduled for next week and overall she has been tolerating her compression wraps and also using her juxta lites on her right lower extremity 11/05/2016 -- the right ABI was 0.95 the left was 1.03. The digit TBI is on the right was 0.83 on the left was 0.92 and she had biphasic flow through these vessels. The impression was that of normal lower extremity arterial study. 11/12/2016 -- her pain is minimal and she  is doing very well overall. 11/26/2016 -- she has got juxta lites and her insurance will not pay for additional dual layer compression stockings. She is going to order some from [place]. 05/12/2017 -- her juxta lites are very old and too big for her and these have not been helping with compression. She did get 20-30 mm compression stockings from [facility] but she and her husband are unable to put these on. I believe she will benefit from bilateral Extremit-ease, compression stockings and we will measure her for these today. 05/20/2017 -- lymphedema on the left lower extremity has increased a lot and she has a open ulceration as a result of this. The right lower extremity is looking pretty good. She has decided to by the compression stockings herself and will get reimbursed by the home health, at a later date. 05/27/2017 -- her sciatica is bothering her a lot and she thought her left leg pain was caused due to the compression wrap and hence removed it and has significant lymphedema. There is no inflammation on this left lower extremity. 06/17/17 on evaluation today patient appears to be doing very well and in fact is completely healed in regard to her ulcerations. Unfortunately however she does have continued issues with lymphedema nonetheless. We did order compression garments for her unfortunately she states that the size that she received were large although we ordered medium. Obviously this means she is not getting the optimal compression. She does not have those with her today and therefore  we could not confirm and contact the company on her behalf. Nonetheless she does state that she is going to have her husband bring them by tomorrow so that we can verify and then get in touch with the company. No fevers, chills, nausea, or vomiting noted at this time. Overall patient is doing better otherwise and I'm pleased with the progress she has made. 07/01/17 on evaluation today patient appears to be doing  very well in regard to her bilateral lower extremity she does not have any openings at this point which is excellent news. Overall I'm pleased with how things have progressed up to this time. Since she is doing so well we did order her compression which we are seeing her today to ensure that it fits her properly and everything is doing well in that regard and then subsequently she will be discharged. ============ Old Notes: 03/31/16 patient presents today for evaluation concerning open wounds that she has over the left medial ankle region as well as the left dorsal foot. She has previously had this occur although it has been healed for a number of months after having this for about a year prior until her hospitalization on 01/05/16. At that point in time it appears that she was admitted to the hospital for left lower extremity cellulitis and was placed on vancomycin and Zosyn at that point. Eventually upon discharge on January 15, 2016 she was placed on doxycycline at that point in time. Later on 03/27/16 positive wound culture growing Escherichia coli this was switched to amoxicillin. Currently she tells me that she is having pain radiated to be a 7 out of 10 which can be as high as 10 out of 10 with palpation and manipulation of the wound. This wound appears to be mainly venous in nature due to the bilateral lower extremity venous stasis/lymphedema. This is definitely much worse on her left than the right side. She does have type 1 diabetes mellitus, hypertension, morbid obesity, and is wheelchair dependent.during the course of the hospital stay a blood culture was also obtained and fortunately appeared negative. She also had an x-ray of the tibia/fibula on the left which showed no acute bone abnormality. Her white blood cell count which was performed last on 03/25/16 was 7.3, hemoglobin 12.8, protein 7.1, albumin 3.0. Her urine culture appeared to be negative for any specific organisms. Patient did  have a left lower extremity venous duplex evaluation for DVT . This did not include venous reflux studies but fortunately was negative for DVT Patient also had arterial studies performed which revealed that she had a . normal ABI on the right though this was unable to be performed on the left secondary to pain that she was having around the ankle region due to the wound. However it was stated on report that she had biphasic pulses and apparently good blood flow. ========== 06/03/17 she is here in follow-up evaluation for right lower extremity ulcer. The right lower sure he has healed but she has reopened to the left medial malleolus and dorsal foot with weeping. She is waiting for new compression garments to arrive from home health, the previous compression garments were ill fitting. We will continue with compression bilaterally and follow-up in 2 [name] Readmission: 08/05/17 on evaluation today patient appears to be doing somewhat poorly in regard to her left lower extremity especially although the right lower extremity has a small area which may no longer be open. She has been having a lot of drainage from the  left lower extremity however he tells me that she has not been able to use the EXTREMIT-EASE Compression at this point. She states that she did better and was able to actually apply the Juxta-Lite compression although the wound that she has is too large and therefore really does not compress which is why she cannot wear it at this point. She has no one who can help her put it on regular basis her son can sometimes but he's not able to do it most of the time. I do believe that's why she has begun to weave and have issues as she is currently yet again. No fevers, chills, nausea, or vomiting noted at this time. Patient is no evidence of dementia. 08/12/17 on evaluation today patient appears to still be doing fairly well in regard to the draining areas/weeping areas at this point. With that being  said she unfortunately did go to the ER yesterday due to what was felt to be possibly a cellulitis. They place her on doxycycline by mouth and discharge her home. She definitely was not admitted. With that being said she states she has had more discomfort which has been unusual for her even compared to prior times and she's had infections.08/12/17 on evaluation today patient appears to still be doing fairly well in regard to the draining areas/weeping areas at this point. With that being said she unfortunately did go to the ER yesterday due to what was felt to be possibly a cellulitis. They place her on doxycycline by mouth and discharge her home. She definitely was not admitted. With that being said she states she has had more discomfort which has been unusual for her even compared to prior times and she's had infections. 08/19/17 put evaluation today patient tells me that she's been having a lot of what sounds to be neuropathic type pain in regard to her left lower extremity. She has been using over-the-counter topical bins again which some believe. That in order to apply the she actually remove the wrap we put on her last Wednesday on Thursday. Subsequently she has not had anything on compression wise since that time. The good news is a lot of the weeping areas appear to have closed at this point again I believe she would do better with compression but we are struggling to get her to actually use what she needs to at this point. No fevers, chills, nausea, or vomiting noted at this time. 09/03/17 on evaluation today patient appears to be doing okay in regard to her lower extremities in regard to the lymphedema and weeping. Fortunately she does not seem to show any signs of infection at this point she does have a little bit of weeping occurring in the right medial malleolus area. With that being said this does not appear to be too significant which is good news. 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 She has severe skin damage secondary to both of these features involving the dorsal left foot and medial left ankle and lower leg. Still has open areas in the left anterior foot. The area on the right closed over. She uses her own juxta light stockings. She does not have an arterial issue 09/16/17 on evaluation today patient actually appears to be doing excellent in regard to her bilateral lower extremity swelling. The Juxta-Lite compression wrap seem to be doing very well for her. She has not however been using the portion that goes over her foot.  Her left foot still is draining a little bit not nearly as significant as it has been in the past but still I do believe that she likely needs to utilize the full wrap including the foot portion of this will improve as well. She also has been apparently putting on a significant amount of Vaseline which also think is not helpful for her. I recommended that if she feels she needs something for moisturizer Eucerin will probably be better. 09/30/17 on evaluation today patient presents with several new open areas in regard to her left lower extremity although these appear to be minimal and mainly seem to be more moisture breakdown than anything. Fortunately she does not seem to have any evidence of infection which is great news. She has been tolerating the dressing changes without complication we are using silver alginate on the foot she has been using AB pads to have the legs and using her Juxta- Lite compression which seems to be controlling her swelling very well. Overall I'm pleased with the poor way she has progressed. 10/14/17 on evaluation today patient appears to be doing better in regard to her left lower extremity areas of weeping. She does still have some discomfort although in general this does not appear to be as macerated and I think it is progressing nicely. I do think she still  needs to wear the foot portion of her Juxta- Lite in order to get the most benefit from the wrap obviously. She states she understands. Fortunately there does not appear to be evidence of infection at this time which is great news. 10/28/17 on evaluation today patient appears to be doing excellent in regard to her left lower extremity. She has just a couple areas that are still open and seem to be causing any trouble whatsoever. For that reason I think that she is definitely headed in the right direction the spots are very tiny compared to what we have been dealing with in the past. 11/11/17 on evaluation today patient appears to have a right lateral lower extremity ulcer that has opened since I last saw her. She states this is where the home health nurse that was coming out remove the dressing without wetting the alginate first. Nonetheless I do not know if this is indeed the case or not but more importantly we have not ordered home help to be coming out for her wounds at all. I'm unsure as to why they are coming out and we're gonna have to check on this and get things situated in that regard. With that being said we currently really do not need them to be coming out as the patient has been taking care of her leg herself without complication and no issues. In fact she was doing much better prior to nursing coming out. 11/25/17 on evaluation today patient actually appears to be doing fairly well in regard to her left lower extremity swelling. In fact she has very little area of weeping at this point there's just a small spot on the lateral portion of her right leg that still has me just a little bit more concerned as far as wanting to see this clear up before I discharge her to caring for this at home. Nonetheless overall she has made excellent progress. 12/09/17 on evaluation today patient appears to be doing rather well in regard to her lower extremity edema. She does have some weeping still in the left  lower extremity although the big area we were taking care of two  [name] ago actually has closed and she has another area of weeping on the left lower extremity immediately as well is the top of her foot. She does not currently have lymphedema pumps she has been wearing her compression daily on a regular basis as directed. With that being said I think she may benefit from lymphedema pumps. 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 We had a very difficult time getting and keeping this under control. 12/23/17 on evaluation today patient actually appears to be doing a little bit more poorly in regard to her bilateral lower extremities. She has been tolerating the Juxta-Lite compression wraps. Unfortunately she has two new ulcers on the right lower extremity and left lower Trinity ulceration seems to be larger. Obviously this is not good news. She has been tolerating the dressings without complication. 12/30/17 on evaluation today patient actually appears to be doing much better in regard to her bilateral lower extremity edema. She continues to have some issues with ulcerations and in fact there appears to be one spot on each leg where the wrap may have caused a little bit of a blister which is subsequently opened up at this point is given her pain. Fortunately it does not appear to be any evidence of infection which is good news. No fevers chills noted. 01/13/18 on evaluation today patient appears to be doing rather well in regard to her bilateral lower extremities. The dressings did get kind of stuck as far as the wound beds are concerned but again I think this is mainly due to the fact that she actually seems to be showing signs of healing which is good news. She's not having as much drainage therefore she was having more of the dressing sticking.  Nonetheless overall I feel like her swelling is dramatically down compared to previous. 01/20/18 on evaluation today patient unfortunately though she's doing better in most regards has a large blister on the left anterior lower extremity where she is draining quite significantly. Subsequently this is going to need debridement today in order to see what's underneath and ensure she does not continue to trapping fluid at this location. Nonetheless No fevers, chills, nausea, or vomiting noted at this time. 01/27/18 on evaluation today patient appears to be doing rather well at this point in regard to her right lower extremity there's just a very small area that she still has open at this point. With that being said I do believe that she is tolerating the compression wraps very well in making good progress. Home health is coming out at this point to see her. Her left lower extremity on the lateral portion is actually what still mainly open and causing her some discomfort for the most part 02/10/18 on evaluation today patient actually appears to be doing very well in regard to her right lower extremity were all the ulcers appear to be completely close. In regard to the left lower extremity she does have two areas still open and some leaking from the dorsal surface of her foot but this still seems to be doing much better to me in general. 02/24/18 on evaluation today patient actually appears to be doing much better in regard to her right lower extremity this is still completely healed. Her left lower extremity is also doing much better fortunately she has no evidence of infection. The one  area that is gonna require some debridement is still on the left anterior shin. Fortunately this is not hurting her as badly today. 03/10/18 on evaluation today patient appears to be doing better in some regards although she has a little bit more open area on the dorsal foot and she also has some issues on the medial portion of  the left lower extremity which is actually new and somewhat deep. With that being said there fortunately does not appear to be any significant signs of infection which is good news. No fevers, chills, nausea, or vomiting noted at this time. In general her swelling seems to be doing fairly well which is good news. 03/31/18 on evaluation today patient presents for follow-up concerning her left lower extremity lymphedema. Unfortunately she has been doing a little bit more poorly since I last saw her in regard to the amount of weeping that she is experiencing. She's also having some increased pain in the anterior shin location. Unfortunately I do not feel like the patient is making such good progress at this point a few [name] back she was definitely doing much better. 04/07/18 on evaluation today patient actually appears to be showing some signs of improvement as far as the left lower extremity is concerned. She has been tolerating the dressing changes and it does appear that the Drawtex did better for her. With that being said unfortunately home health is stating that they cannot obtain the Drawtex going forward. Nonetheless we're gonna have to check and see what they may be able to get the alginate they were using was getting stuck in causing new areas of skin being pulled all that with and subsequently weep and calls her to worsen overall this is the first time we've seen improvement at this time. 04/14/18 on evaluation today patient actually appears to be doing rather well at this point there does not appear to be any evidence of infection at this time and she is actually doing excellent in regard to the weeping in fact she almost has no openings remaining even compared to just last week this is a dramatic improvement. No fevers chills noted 04/21/18 evaluation today patient actually appears to be doing very well. She in fact is has a small area on the posterior lower extremity location and she has  a small area on the dorsal surface of her foot that are still open both of which are very close to closing. We're hoping this will be close shortly. She brought her Juxta-Lite wrap with her today hoping that would be able to put her in it unfortunately I don't think were quite at that point yet but we're getting closer. 04/28/18 upon evaluation today patient actually appears to be doing excellent in regard to her left lower extremity ulcer. In fact the region on the posterior lower extremity actually is much smaller than previously noted. Overall I'm very happy with the progress she has made. She again did bring her Juxta-Lite although we're not quite ready for that yet. 05/11/18 upon evaluation today patient actually appears to be doing in general fairly well in regard to her left lower Trinity. The swelling is very well controlled. With that being said she has a new area on the left anterior lower extremity as well as between the first and second toes of her left foot that was not present during the last evaluation. The region of her posterior left lower extremity actually appears to be almost completely healed. T be honest I'm very pleased  with o the way that stands. Nonetheless I do believe that the lotion may be keeping the area to moist as far as her legs are concerned subsequently I'm gonna consider discontinuing that today. 05/26/18 on evaluation today patient appears to be doing rather well in regard to her left lower should be ulcers. In fact everything appears to be close except for a very small area on the left posterior lower extremity. Fortunately there does not appear to be any evidence of infection at this time. Overall very pleased with her progress. 06/02/18 and evaluation today patient actually appears to be doing very well in regard to her lower extremity ulcers. She has one small area that still continues to weep that I think may benefit her being able to justify lotion and user  Juxta-Lite wraps versus continued to wrap her. Nonetheless I think this is something we can definitely look into at this point. 06/23/18 on evaluation today patient unfortunately has openings of her bilateral lower extremities. In general she seems to be doing much worse than when I last saw her just as far as her overall health standpoint is concerned. She states that her discomfort is mainly due to neuropathy she's not having any other issues otherwise. No fevers, chills, nausea, or vomiting noted at this time. 06/30/18 on evaluation today patient actually appears to be doing a little worse in regard to her right lower extremity her left lower extremity of doing fairly well. Fortunately there is no sign of infection at this time. She has been tolerating the dressing changes without complication. Home health did not come out like they were supposed to for the appropriate wrap changes. They stated that they never received the orders from [place] which were fax. Nonetheless we will send a copy of the orders with the patient today as well. 07/07/18 on evaluation today patient appears to be doing much better in regard to lower extremities. She still has several openings bilaterally although since I last saw her her legs did show obvious signs of infection when she later saw her nurse. Subsequently a culture was obtained and she is been placed on Bactrim and Keflex. Fortunately things seem to be looking much better it does appear she likely had an infection. Again last week we'd even discussed it but again there really was not any obvious sign that she had infection therefore we held off on the antibiotics. Nonetheless I'm glad she's doing better today. 07/14/18 on evaluation today patient appears to be doing much better regarding her bilateral lower Trinity's. In fact on the right lower for me there's nothing open at this point there are some dry skin areas at the sites where she had infection. Fortunately there is  no evidence of systemic infection which is excellent news. No fevers chills noted 07/21/18 on evaluation today patient actually appears to be doing much better in regard to her left lower extremity ulcers. She is making good progress and overall I feel like she's improving each time I see her. She's having no pain I do feel like the infection is completely resolved which is excellent news. No fevers, chills, nausea, or vomiting noted at this time. 07/28/18 on evaluation today patient appears to be doing very well in regard to her left lower [employer]. Everything seems to be showing signs of improvement which is excellent news. Overall very pleased with the progress that has been made. Fortunately there's no evidence of active infection at this time also excellent news. 08/04/18 on evaluation  today patient appears to be doing more poorly in regard to her bilateral lower extremities. She has two new areas open up on the right and these were completely closed as of last week. She still has the two spots on the left which in my pinion seem to be doing better. Fortunately there's no evidence of infection again at this point. 08/11/18 on evaluation today patient actually appears to be doing very well in regard to her bilateral lower Trinity wounds that all seem to be doing better and are measures smaller today. Fortunately there's no signs of infection. No fevers, chills, nausea, or vomiting noted at this time. 08/18/18 on evaluation today patient actually appears to be doing about the same inverter bilateral lower extremities. She continues to have areas that blistering open as was drain that fortunately nothing too significant. Overall I feel like Drawtex may have done better for her however compared to the collagen. 08/25/18 on evaluation today patient appears to be doing a little bit more poorly today even compared to last time I saw her. Again I'm not exactly sure why she's making worse progress over  the past several [name]. I'm beginning to wonder if there is some kind of underlying low level infection causing this issue. I did actually take a culture from the left anterior lower extremity but it was a new wound draining quite a bit at this point. Unfortunately she also seems to be having more pain which is what also makes me worried about the possibility of infection. This is despite never erythema noted at this point. 09/01/18 on evaluation today patient actually appears to be doing a little worse even compared to last week in regard to bilateral lower extremities. She did go to the hospital on the 16th was given a dose of IV Zosyn and then discharged with a recommendation to continue with the Bactrim that I previously prescribed for her. Nonetheless she is still having a lot of discomfort she tells me as well at this time. This is definitely unfortunate. No fevers, chills, nausea, or vomiting noted at this time. 09/08/18 on evaluation today patient's bilateral lower extremities actually appear to be shown signs of improvement which is good news. Fortunately there does not appear to be any signs of active infection I think the anabiotic is helping in this regard. Overall I'm very pleased with how she is progressing. 09/15/18 patient was actually seen in ER yesterday due to her legs as well unfortunately. She states she's been having a lot of pain and discomfort as well as a lot of drainage. Upon inspection today the patient does have a lot of swelling and drainage I feel like this is more related to lymphedema and poor fluid control than it is to infection based on what I'm seeing. The physician in the emergency department also doubted that the patient was having a significant infection nonetheless I see no evidence of infection obvious at this point although I do see evidence of poor fluid control. She still not using a compression pumps, she is not elevating due to her lift chair as well as her  hospital bed being broken, and she really is not keeping her legs up as much as they should be and also has been taking off her wraps. All this combined I think has led to poor fluid control and to be honest she may be somewhat volume overloaded in general as well. I recommend that she may need to contact your physician to see if a  prescription for a diuretic would be beneficial in their opinion. As long as this is safe I think it would likely help her. 09/29/18 on evaluation today patient's left lower extremity actually appears to be doing quite a bit better. At least compared to last time that I saw her. She still has a large area where she is draining from but there's a lot of new skin speckled trout and in fact there's more new skin that there are open areas of weeping and drainage at this point. This is good news. With regard to the right lower extremity this is doing much better with the only open area that I really see being a dry spot on the right lateral ankle currently. Fortunately there's no signs of active infection at this time which is good news. No fevers, chills, nausea, or vomiting noted at this time. The patient seems somewhat stressed and overwhelmed during the visit today she was very lethargic as such. She does and she is not taking any pain medications at this point. Apparently according to her husband are also in the process of moving which is probably taking its toll on her as well. 10/06/18 on evaluation today patient appears to be doing rather well in regard to her lower extremities compared to last evaluation. Fortunately there's no signs of active infection. She tells me she did have an appointment with her primary. Nonetheless he was concerned that the wounds were somewhat deep based on pictures but we never actually saw her legs. She states that he had her somewhat worried due to the fact that she was fearing now that she was [place] have to have an amputation. With that being  said based on what I'm seeing check she looks better this week that she has the last two times I've seen her with much less drainage I'm actually pleased in this regard. That doesn't mean that she's out of the water but again I do not think what the point of talking about education at all in regard to her leg. She is very happy to hear this. She is also not having as much pain as she was having last week. 10/13/18 unfortunately on evaluation today patient still continues to have a significant amount of drainage she's not letting home health actually apply the compression dressings at this point. She's trying to use of Juxta-Lite of the top of Kerlex and the second layer of the three layer compression wrap. With that being said she just does not seem to be making as good a progress as I would expect if she was having the compression applied and in place on a regular basis. No fevers, chills, nausea, or vomiting noted at this time. 10/20/18 on evaluation today patient appears to be doing a little better in regard to her bilateral lower extremity ulcers. In fact the right lower extremity seems to be healed she doesn't even have any openings at this point left lower extremity though still somewhat macerated seems to be showing signs of new skin growth at multiple locations throughout. Fortunately there's no evidence of active infection at this time. No fevers, chills, nausea, or vomiting noted at this time. 10/27/18 on evaluation today patient appears to be doing much better in regard to her left lower Trinity ulcer. She's been tolerating the laptop complication and has minimal drainage noted at this point. Fortunately there's no signs of active infection at this time. No fevers, chills, nausea, or vomiting noted at this time. 11/03/18 on evaluation today  patient actually appears to be doing excellent in regard to her left lower extremity. She is having very little drainage at this point there does not appear  to be any significant signs of infection overall very pleased with how things have gone. She is likewise extremely pleased still and seems to be making wonderful progress week to week. I do believe antibiotics were helpful for her. Her primary care provider did place on amateur clean since I last saw her. 11/17/18 on evaluation today patient appears to be doing worse in regard to her bilateral lower extremities at this point. She is been tolerating the dressing changes without complication. With that being said she typically takes the Coban off fairly quickly upon arriving home even after being seen here in the clinic and does not allow home health reapply command as part of the dressing at home. Therefore she said no compression essentially since I last saw her as best I can tell. With that being said I think it shows and how much swelling she has in the open wounds that are noted at this point. Fortunately there's no signs of infection but unfortunately if she doesn't get this under control I think she will end up with infection and more significant issues. 11/24/18 on evaluation today patient actually appears to be doing somewhat better in regard to her bilateral lower extremities. 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 Nonetheless that is doing better currently so she should be able to attempt reinitiating the lymphedema pumps at this time. No fevers, chills, nausea, or vomiting noted at this time. 12/01/18 upon evaluation today patient's left lower extremity appears to be doing quite well unfortunately her right lower extremity is not doing nearly as well. She has been tolerating the dressing changes without complication unfortunately she did not keep a wrap on the right at this time. Nonetheless I believe this has led to increased swelling and weeping in  the world is actually much larger than during the last evaluation with her. 12/08/18 on evaluation today patient appears to be doing about the same at this point in regard to her right lower extremity. There is some more palatable to touch I'm concerned about the possibility of there being some infection although I think the main issue is she's not keeping her compression wrap on which in turn is not allowing this area to heal appropriately. 12/22/18 on evaluation today patient appears to be doing better in regard to left lower extremity unfortunately significantly worse in regard to the right lower extremity. The areas of blistering and necrotic superficial tissue have spread and again this does not really appear to be signs of infection and all she just doesn't seem to be doing nearly as well is what she has been in the past. Overall I feel like the Augmentin did absolutely nothing for her she doesn't seem to have any infection again I really didn't think so last time either is more of a potential preventative measure and hoping that this would make some difference but I think the main issue is she's not wearing her compression. She tells me she cannot wear the [place] been we put on she takes it off pretty much upon getting home. Subsequently she worshiped Juxta-Lite when I questioned her about how often she wears it this is no  more than three hours a day obviously that leaves 21 hours that she has no compression and this is obviously not doing well for her. Overall I'm concerned that if things continue to worsen she is at great risk of both infection as well as losing her leg. 01/05/19 on evaluation today patient appears to be doing well in regard to her left lower extremity which he is allowing [place] to wrap and not so well with regard to her right lower extremity which she is not allowing [place] to really wrap and keep the wrap on. She states that it hurts too badly whenever it's wrapped and she ends up  having to take it off. She's been using the Juxta-Lite she tells me up to six hours a day although I question whether or not that's really been the case to be honest. Previously she told me three hours today nonetheless obviously the legs as long as the wrap is doing great when she is not is doing much more poorly. 01/12/2019 on evaluation today patient actually appears to be doing a little better in my opinion with regard to her right lower extremity ulcer. She has a small open area on the left lower extremity unfortunately but again this I think is part of the normal fluctuation of what she is going to have to expect with regard to her legs especially when she is not using her lymphedema pumps on a regular basis. Subsequently based on what I am seeing today I think that she does seem to be doing slightly better with regard to her right lower extremity she did see her primary care provider on Monday they felt she had an infection and placed her on 2 antibiotics. Both Cipro and clindamycin. Subsequently again she seems possibly to be doing a little bit better in regards to the right lower extremity she also tells me however she has been wearing the compression wrap over the past week since I spoke with her as well that is a Kerlix and Coban wrap on the right. No fevers, chills, nausea, vomiting, or diarrhea. 01/19/2019 on evaluation today patient appears to be doing better with regard to her bilateral lower extremities especially the right. I feel like the compression has been beneficial for her which is great news. She did get a call from her primary care provider on her way here today telling her that she did have methicillin- resistant Staphylococcus aureus and he was calling in a couple new antibiotics for her including a ointment to be applied she tells me 3 times a day. With that being said this sounds like likely to be Bactroban which I think could be applied with each dressing/wrap change but I  would not be able to accommodate her applying this 3 times a day. She is in agreement with the least doing this we will add that to her orders today. 01/26/2019 on evaluation today patient actually appears to be doing much better with regard to her right lower extremity. Her left lower extremity is also doing quite well all things considering. Fortunately there is no evidence of active infection at this time. No fevers, chills, nausea, vomiting, or diarrhea. 02/02/2019 on evaluation today patient appears to be doing much better compared to her last evaluation. Little by little off like her right leg is returning more towards normal. There does not appear to be any signs of active infection and overall she seems to be doing quite well which is great news. I am very pleased  in this regard. No fevers, chills, nausea, vomiting, or diarrhea. 02/09/2019 upon evaluation today patient appears to be doing better with regard to her bilateral lower extremities. She has been tolerating the dressing changes without complication. Fortunately there is no signs of active infection at this time. No fevers, chills, nausea, vomiting, or diarrhea. 02/23/2019 on evaluation today patient actually appears to be doing quite well with regard to her bilateral lower extremities. She has been tolerating the dressing changes without complication. She is even used her pumps one time and states that she really felt like it felt good. With that being said she seems to be in good spirits and her legs appear to be doing excellent. 03/09/2019 on evaluation today patient appears to be doing well with regard to her right lower extremity there are no open wounds at this time she is having some discomfort but I feel like this is more neuropathy than anything. With regard to her left lower extremity she had several areas scattered around that she does have some weeping and drainage from but again overall she does not appear to be having any  significant issues and no evidence of infection at this time which is good news. 03/23/2019 on evaluation today patient appears to be doing well with regard to her right lower extremity which she tells me is still close she is using her juxta light here. Her left lower extremity she mainly just has an area on the foot which is still slightly draining although this also is doing great. Overall very pleased at this time. 04/06/2019 patient appears to be doing a little bit worse in regard to her left lower extremity upon evaluation today. She feels like this could be becoming infected again which she had issues with previous. Fortunately there is no signs of systemic infection but again this is always a struggle with her with her legs she will go from doing well to not so well in a very short amount of time. 04/20/2019 on evaluation today patient actually appears to be doing quite well with regard to her right lower extremity I do not see any signs of active infection at this time. Fortunately there is no fever chills noted. She is still taking the antibiotics which I prescribed for her at this point. In regard to the left lower extremity I do feel like some of these areas are better although again she still is having weeping from several locations at this time. 04/27/2019 on evaluation today patient appears to be doing about the same if not slightly worse in regard to her left lower extremity ulcers. She tells me when questioned that she has been sleeping in her Hoveround chair in fact she tells me she falls asleep without even knowing it. I think she is spending a whole lot of time in the chair and less time walking and moving around which is not good for her legs either. On top of that she is in a seated position which is also the worst position she is not really elevating her legs and she is also not using her lymphedema pumps. All this is good to contribute to worsening of her condition in  general. 05/18/2019 on evaluation today patient appears to be doing well with regard to her lower extremity on the right in fact this is showing no signs of any open wounds at this time. On the left she is continuing to have issues with areas that do drain. Some of the regions have healed and  there are couple areas that have reopened. She did go to the ER per the patient according to recommendations from the home health nurse due to what she was seen when she came out on 05/13/2019. Subsequently she felt like the patient needed to go to the hospital due to the fact that again she was having "milky white discharge" from her leg. Nonetheless she had and then was placed on doxycycline and subsequently seems to be doing better. 06/01/2019 upon evaluation today patient appears to be doing really in my opinion about the same. I do not see any signs of active infection which is good news. Overall she still has wounds over the bilateral lower extremities she has reopened on the right but this appears to be more of a crack where there is weeping/edema coming from the region. I do not see any evidence of infection at either site based on what I visualized today. 07/13/2019 upon evaluation today patient appears to be doing a little worse compared to last time I saw her. She since has been in the hospital from 06/21/2019 through 06/29/2019. This was secondary to having Covid. During that time they did apply lotion to her legs which unfortunately has caused her to develop a myriad of open wounds on her lower extremities. Her legs do appear to be doing better as far as the overall appearance is concerned but nonetheless she does have more open and weeping areas. 07/27/2019 upon evaluation today patient appears to be doing more poorly to be honest in regard to her left lower extremity in particular. There is no signs of systemic infection although I do believe she may have local infection. She notes she has been having  a lot of blue/green drainage which is consistent potentially with Pseudomonas. That may be something that we need to consider here as well. The doxycycline does not seem to have been helping. 08/03/2019 upon evaluation today patient appears to be doing a little better in my opinion compared to last week's evaluation. Her culture I did review today and she is on appropriate medications to help treat the Enterobacter that was noted. Overall I feel like that is good news. With that being said she is unfortunately continuing to have a lot of drainage and though it is doing better I still think she has a long ways to go to get things dried up in general. Fortunately there is no signs of systemic infection. 08/10/2019 upon evaluation today patient appears to be doing may be slightly better in regard to her left lower extremity the right lower extremity is doing much better. Fortunately there is no signs of infection right now which is good news. No fevers, chills, nausea, vomiting, or diarrhea. 08/24/2019 on evaluation today patient appears to be doing slightly better in regard to her lower extremities. The left lower extremity seems to be healed the right lower extremity is doing better though not completely healed as far as the openings are concerned. She has some generalized issues here with edema and weeping secondary to her lymphedema though again I do believe this is little bit drier compared to prior [name] evaluations. In general I am very pleased with how things seem to be progressing. No fevers, chills, nausea, vomiting, or diarrhea. 08/31/2019 upon evaluation today patient actually seems to making some progress here with regard to the left lower extremity in particular. She has been tolerating the dressing changes without complication. Fortunately there is no signs of active infection at this time. No fevers,  chills, nausea, vomiting, or diarrhea. She did see Dr. Doren Custard and he did note that she did have  a issue with the left great saphenous vein and the small saphenous vein in the leg. With that being said he was concerned about the possibility of laser ablation not being extremely successful. He also mentioned a small risk of DVT associated with the procedure. However if the wounds do not continue to improve he stated that that would probably be the way to go. Fortunately the patient's legs do seem to be doing much better. 09/07/2019 upon evaluation today patient appears to be doing better with regard to her lower extremities. She has been tolerating the dressing changes without complication. With that being said she is showing signs of improvement and overall very pleased. There are some areas on her leg that I think we do need to debride we discussed this last week the patient is in agreement with doing that as long as it does not hurt too badly. 09/14/2019 upon evaluation today patient appears to be doing decently well with regard to her left lower extremity. She is not having near as much weeping as she has had in the past things seem to be drying up which is good news. There is no signs of active infection at this time. 09/21/19 upon evaluation today patient appears to be doing better in regard overall to her bilateral lower extremities. She again has less open than she did previous and each week I feel like this is getting better. Fortunately there is no signs of active infection at this time. No fevers, chills, nausea, vomiting, or diarrhea. 09/28/2019 upon evaluation today patient actually appears to be showing signs of improvement with regard to her left lower extremity. Unfortunately the right medial lower extremity around the ankle region has reopened to some degree but this appears to be minimal still which is good news. There is no signs of active infection at this time which is also good news. 10/12/2019 upon evaluation today patient appears to be doing okay with regard to her bilateral  lower extremities today. The right is a little bit worse then last evaluation 2 [name] ago. The left is actually doing a little better in my opinion. Overall there is no signs of active infection at this time that I see. Obviously that something we have to keep a close eye on she is very prone to this with the significant and multiple openings that she has over the bilateral lower extremities. 10/19/2019 upon evaluation today patient appears to be doing about the best that I have seen her in quite some time. She has been tolerating the dressing changes without complication. There does not appear to be any signs of active infection and overall I am extremely happy with the way her legs appeared. She is drying up quite nicely and overall is having less pain. 11/09/2019 upon evaluation today patient appears to be doing better in regard to her wounds. She seems to be drying up more and more each time I see her this is just taking a very long time. Fortunately there is no signs of active infection at this time. 11/23/2019 upon evaluation today patient actually appears to be doing excellent in regard to her lower extremities at this point compared to where she has been. Fortunately there is no signs of active infection at this time. She did go to the hospital last week for nausea and vomiting completely unrelated to her wounds. Fortunately she is doing better  she was given some Reglan and got better. She had associated abdominal pain but they never found out what was going on. 12/07/2019 upon evaluation today patient appears to be doing well for the most part in regard to her legs. She unfortunately has not been keeping the Coban portion of her wraps on therefore the compression has not really been sufficient for what it is supposed to be. Nonetheless she tells me that it just hurt too bad therefore she removed it. 12/21/2019 upon evaluation today patient actually appears to be doing quite well with regard to her  legs. I do feel like she has been making progress which is great news and overall there is no signs of active infection at this time. No fevers, chills, nausea, vomiting, or diarrhea. Electronic Signature(s) Signed: 12/21/2019 11:16:26 AM By: [name] PA-C Entered By: [name] on 12/21/2019 11:16:26 -------------------------------------------------------------------------------- Physical Exam Details Patient Name: Date of Service: RUCHY, WILDRICK 12/21/2019 10:30 A M Medical Record Number: [id number] Patient Account Number: [id number] Date of Birth/Sex: Treating RN: 27-Jan-1951 (69 y.o. [name] Primary Care Provider: Dustin [name] Other Clinician: Referring Provider: Treating Provider/Extender: [name], [name] in Treatment: 68 Constitutional Well-nourished and well-hydrated in no acute distress. Respiratory normal breathing without difficulty. Psychiatric this patient is able to make decisions and demonstrates good insight into disease process. Alert and Oriented x 3. pleasant and cooperative. Notes Patient's wound bed currently showed signs of good granulation and epithelization and a lot of areas she still does have a lot of weeping but nonetheless I feel like we are making progress here which is good news this is very slow but nonetheless progress. I did not need to perform any sharp debridement today. Electronic Signature(s) Signed: 12/21/2019 11:16:41 AM By: [name] PA-C Entered By: [name] on 12/21/2019 11:16:41 -------------------------------------------------------------------------------- Physician Orders Details Patient Name: Date of Service: [name]. 12/21/2019 10:30 A M Medical Record Number: [id number] Patient Account Number: [id number] Date of Birth/Sex: Treating RN: November 23, 1950 (69 y.o. [name] Primary Care Provider: Dustin [name] Other Clinician: Referring Provider: Treating Provider/Extender:  [name], [name] in Treatment: [phone] Verbal / Phone Orders: No Diagnosis Coding ICD-10 Coding Code Description E11.622 Type 2 diabetes mellitus with other skin ulcer I89.0 Lymphedema, not elsewhere classified I87.331 Chronic venous hypertension (idiopathic) with ulcer and inflammation of right lower extremity I87.332 Chronic venous hypertension (idiopathic) with ulcer and inflammation of left lower extremity L97.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 leg with fat layer exposed L97.822 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 I10 Essential (primary) hypertension E66.01 Morbid (severe) obesity due to excess calories F41.8 Other specified anxiety disorders R53.1 Weakness Follow-up Appointments Return Appointment in 2 [name]. Dressing Change Frequency Wound #61 Left,Circumferential Lower Leg Change dressing three times week. Wound #64 Left,Dorsal Foot Change dressing three times week. Wound #65 Right,Medial Lower Leg Change dressing three times week. Skin Barriers/Peri-Wound Care Barrier cream - zinc oxide cream to any macerated areas Moisturizing lotion - to dry skin Wound Cleansing Clean wound with Wound Cleanser - all wounds, wash legs with soap and water with dressing changes May shower with protection. Primary Wound Dressing Wound #61 Left,Circumferential Lower Leg Calcium Alginate with Silver Wound #64 Left,Dorsal Foot Calcium Alginate with Silver Wound #65 Right,Medial Lower Leg Calcium Alginate with Silver Secondary Dressing  Wound #61 Left,Circumferential Lower Leg Dry Gauze - all wounds ABD pad - as needed Wound #64 Left,Dorsal Foot Dry Gauze - all wounds ABD pad - as needed Wound #65 Right,Medial Lower Leg Dry Gauze Edema Control 3 Layer Compression System - Bilateral void standing for long periods of  time - walking is encouraged A Elevate legs to the level of the heart or above for 30 minutes daily and/or when sitting, a frequency of: - do not sleep in chair with feet dangling, MUST elevate legs while sitting Exercise regularly Segmental Compressive Device. - lymphedema pumps 60 minutes 1- 2 times per day Off-Loading Turn and reposition every 2 hours Additional Orders / Instructions Follow Nutritious Diet - T include vitamin A, vitamin C, and Zinc along with increased protein intake. o [facility] for wound care. - [facility] Electronic Signature(s) Signed: 12/21/2019 5:11:14 PM By: [name] PA-C Signed: 12/21/2019 5:28:41 PM By: [name] RN, BSN Entered By: [name] on 12/21/2019 11:14:14 -------------------------------------------------------------------------------- Problem List Details Patient Name: Date of Service: [name]. 12/21/2019 10:30 A M Medical Record Number: [id number] Patient Account Number: [id number] Date of Birth/Sex: Treating RN: April 03, 1951 (69 y.o. [name] Primary Care Provider: Dustin [name] Other Clinician: Referring Provider: Treating Provider/Extender: [name], [name] in Treatment: [id number] Active Problems ICD-10 Encounter Code Description Active Date MDM Diagnosis E11.622 Type 2 diabetes mellitus with other skin ulcer 08/05/2017 No Yes I89.0 Lymphedema, not elsewhere classified 08/05/2017 No Yes I87.331 Chronic venous hypertension (idiopathic) with ulcer and inflammation of right 08/05/2017 No Yes lower extremity I87.332 Chronic venous hypertension (idiopathic) with ulcer and inflammation of left 08/05/2017 No Yes lower extremity L97.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 leg with fat layer 08/05/2017 No Yes exposed L97.822 Non-pressure chronic ulcer of other part of left lower leg with fat layer exposed2/20/2019 No Yes L97.522 Non-pressure  chronic ulcer of other part of left foot with fat layer exposed 06/01/2019 No Yes I10 Essential (primary) hypertension 08/05/2017 No Yes E66.01 Morbid (severe) obesity due to excess calories 08/05/2017 No Yes F41.8 Other specified anxiety disorders 08/05/2017 No Yes R53.1 Weakness 08/05/2017 No Yes Inactive Problems Resolved Problems Electronic Signature(s) Signed: 12/21/2019 11:05:11 AM By: [name] PA-C Entered By: [name] on 12/21/2019 11:05:10 -------------------------------------------------------------------------------- Progress Note Details Patient Name: Date of Service: [name]. 12/21/2019 10:30 A M Medical Record Number: [id number] Patient Account Number: [id number] Date of Birth/Sex: Treating RN: 20-Dec-1950 (69 y.o. [name] Primary Care Provider: Dustin [name] Other Clinician: Referring Provider: Treating Provider/Extender: [name], [name] in Treatment: [phone] Subjective Chief Complaint Information obtained from Patient Bilateral reoccurring LE ulcers History of Present Illness (HPI) this patient has been seen a couple of times before and returns with recurrent problems to her right and left lower extremity with swelling and weeping ulcerations due to not wearing her compression stockings which she had been advised to do during her last discharge, at the end of June 2018. During her last visit the patient had had normal arterial blood flow and her venous reflux study did not necessitate any surgical intervention. She was recommended compression and elevation and wound care. After prolonged treatment the patient was completely healed but she has been noncompliant with wearing or compressions.. She was here last week with an outpatient return visit planned but the patient came in a very poor general condition with altered mental status and was rushed to the ER  on my request. With a history of hypertension, diabetes, TIA and  right-sided weakness she was set up for an MRI on her brain and cervical spine and was sent to [facility]. Getting an MRI done was very difficult but once the workup was done she was found not to have any spinal stenosis, epidural abscess or hematoma or discitis. This was radiculopathy to be treated as an outpatient and she was given a follow-up appointment. Today she is feeling much better alert and oriented and has come to reevaluate her bilateral lower extremity lymphedema and ulceration 03/25/2017 -- she was admitted to the hospital on 03/16/2017 and discharged on 03/18/2017 with left leg cellulitis and ulceration. She was started on vancomycin and Zosyn and x-ray showed no bony involvement. She was treated for a cellulitis with IV antibiotics changed to Rocephin and Flagyl and was discharged on oral Keflex and doxycycline to complete a 7 day course. Last hemoglobin A1c was 7.1 and her other ailments including hypertension got asthma were appropriately treated. 05/06/2017 -- she is awaiting the right size of compression stockings from [place] but other than that has been doing well. ====== Old notes 69 year old patient was seen one time last October and was lost to follow-up. She has recurrent problems with weeping and ulceration of her left lower extremity and has swelling of this for several years. It has been worse for the last 2 months. Past medical history is significant for diabetes mellitus type 2, hypertension, gout, morbid obesity, depressive disorders, hiatal hernia, migraines, status post knee surgery, risk of a cholecystectomy, vaginal hysterectomy and breast biopsy. She is not a smoker. As noted before she has never had a venous duplex study and an arterial ABI study was attempted but the left lower extremity was noncompressible 10/01/2016 -- had a lower extremity venous duplex reflux evaluation which showed no evidence of deep vein reflux in the right or left lower extremity, and  no evidence of great saphenous vein reflux more than 500 ms in the right or left lower extremity, and the left small saphenous vein is incompetent but no vascular consult was recommended. review of her electronic medical records noted that the ABI was checked in July 2017 where the right ABI was normal limits and the left ABI could not be ascertained due to pain with cuff pressure but the waveforms are within normal limits. her arterial duplex study scheduled for April 27. 10/08/2016 -- the patient has various reasons for not having a compression on and for the last 3 days she has had no compression on her left lower extremity either due to pain or the lack of nursing help. She does not use her juxta lites either. 10/15/2016 -- the patient did not keep her appointment for arterial duplex study on April 27 and I have asked her to reschedule this. Her pain is out of proportion with the physical findings and she continuously fails to wear a compression wraps and cuts them off because she says she cannot tolerate the pain. She does not use her juxta lites either. 10/22/2016 -- he has rescheduled her arterial duplex study to May 21 and her pain today is a bit better. She has not been wearing her juxta lites on her right lower extremity but now understands that she needs to do this. She did tolerate the to press compression wrap on her left lower extremity 10/29/2016 --arterial duplex study is scheduled for next week and overall she has been tolerating her compression wraps and also using  her juxta lites on her right lower extremity 11/05/2016 -- the right ABI was 0.95 the left was 1.03. The digit TBI is on the right was 0.83 on the left was 0.92 and she had biphasic flow through these vessels. The impression was that of normal lower extremity arterial study. 11/12/2016 -- her pain is minimal and she is doing very well overall. 11/26/2016 -- she has got juxta lites and her insurance will not pay for  additional dual layer compression stockings. She is going to order some from [place]. 05/12/2017 -- her juxta lites are very old and too big for her and these have not been helping with compression. She did get 20-30 mm compression stockings from [place] but she and her husband are unable to put these on. I believe she will benefit from bilateral Extremit-ease, compression stockings and we will measure her for these today. 05/20/2017 -- lymphedema on the left lower extremity has increased a lot and she has a open ulceration as a result of this. The right lower extremity is looking pretty good. She has decided to by the compression stockings herself and will get reimbursed by the home health, at a later date. 05/27/2017 -- her sciatica is bothering her a lot and she thought her left leg pain was caused due to the compression wrap and hence removed it and has significant lymphedema. There is no inflammation on this left lower extremity. 06/17/17 on evaluation today patient appears to be doing very well and in fact is completely healed in regard to her ulcerations. Unfortunately however she does have continued issues with lymphedema nonetheless. We did order compression garments for her unfortunately she states that the size that she received were large although we ordered medium. Obviously this means she is not getting the optimal compression. She does not have those with her today and therefore we could not confirm and contact the company on her behalf. Nonetheless she does state that she is going to have her husband bring them by tomorrow so that we can verify and then get in touch with the company. No fevers, chills, nausea, or vomiting noted at this time. Overall patient is doing better otherwise and I'm pleased with the progress she has made. 07/01/17 on evaluation today patient appears to be doing very well in regard to her bilateral lower extremity she does not have any openings at this point  which is excellent news. Overall I'm pleased with how things have progressed up to this time. Since she is doing so well we did order her compression which we are seeing her today to ensure that it fits her properly and everything is doing well in that regard and then subsequently she will be discharged. ============ Old Notes: 03/31/16 patient presents today for evaluation concerning open wounds that she has over the left medial ankle region as well as the left dorsal foot. She has previously had this occur although it has been healed for a number of months after having this for about a year prior until her hospitalization on 01/05/16. At that point in time it appears that she was admitted to the hospital for left lower extremity cellulitis and was placed on vancomycin and Zosyn at that point. Eventually upon discharge on January 15, 2016 she was placed on doxycycline at that point in time. Later on 03/27/16 positive wound culture growing Escherichia coli this was switched to amoxicillin. Currently she tells me that she is having pain radiated to be a 7 out of 10 which  can be as high as 10 out of 10 with palpation and manipulation of the wound. This wound appears to be mainly venous in nature due to the bilateral lower extremity venous stasis/lymphedema. This is definitely much worse on her left than the right side. She does have type 1 diabetes mellitus, hypertension, morbid obesity, and is wheelchair dependent.during the course of the hospital stay a blood culture was also obtained and fortunately appeared negative. She also had an x-ray of the tibia/fibula on the left which showed no acute bone abnormality. Her white blood cell count which was performed last on 03/25/16 was 7.3, hemoglobin 12.8, protein 7.1, albumin 3.0. Her urine culture appeared to be negative for any specific organisms. Patient did have a left lower extremity venous duplex evaluation for DVT . This did not include venous reflux  studies but fortunately was negative for DVT Patient also had arterial studies performed which revealed that she had a . normal ABI on the right though this was unable to be performed on the left secondary to pain that she was having around the ankle region due to the wound. However it was stated on report that she had biphasic pulses and apparently good blood flow. ========== 06/03/17 she is here in follow-up evaluation for right lower extremity ulcer. The right lower sure he has healed but she has reopened to the left medial malleolus and dorsal foot with weeping. She is waiting for new compression garments to arrive from home health, the previous compression garments were ill fitting. We will continue with compression bilaterally and follow-up in 2 [name] Readmission: 08/05/17 on evaluation today patient appears to be doing somewhat poorly in regard to her left lower extremity especially although the right lower extremity has a small area which may no longer be open. She has been having a lot of drainage from the left lower extremity however he tells me that she has not been able to use the EXTREMIT-EASE Compression at this point. She states that she did better and was able to actually apply the Juxta-Lite compression although the wound that she has is too large and therefore really does not compress which is why she cannot wear it at this point. She has no one who can help her put it on regular basis her son can sometimes but he's not able to do it most of the time. I do believe that's why she has begun to weave and have issues as she is currently yet again. No fevers, chills, nausea, or vomiting noted at this time. Patient is no evidence of dementia. 08/12/17 on evaluation today patient appears to still be doing fairly well in regard to the draining areas/weeping areas at this point. With that being said she unfortunately did go to the ER yesterday due to what was felt to be possibly a cellulitis.  They place her on doxycycline by mouth and discharge her home. She definitely was not admitted. With that being said she states she has had more discomfort which has been unusual for her even compared to prior times and she's had infections.08/12/17 on evaluation today patient appears to still be doing fairly well in regard to the draining areas/weeping areas at this point. With that being said she unfortunately did go to the ER yesterday due to what was felt to be possibly a cellulitis. They place her on doxycycline by mouth and discharge her home. She definitely was not admitted. With that being said she states she has had more discomfort which has  been unusual for her even compared to prior times and she's had infections. 08/19/17 put evaluation today patient tells me that she's been having a lot of what sounds to be neuropathic type pain in regard to her left lower extremity. She has been using over-the-counter topical bins again which some believe. That in order to apply the she actually remove the wrap we put on her last Wednesday on Thursday. Subsequently she has not had anything on compression wise since that time. The good news is a lot of the weeping areas appear to have closed at this point again I believe she would do better with compression but we are struggling to get her to actually use what she needs to at this point. No fevers, chills, nausea, or vomiting noted at this time. 09/03/17 on evaluation today patient appears to be doing okay in regard to her lower extremities in regard to the lymphedema and weeping. Fortunately she does not seem to show any signs of infection at this point she does have a little bit of weeping occurring in the right medial malleolus area. With that being said this does not appear to be too significant which is good news. 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 She has severe skin damage  secondary to both of these features involving the dorsal left foot and medial left ankle and lower leg. Still has open areas in the left anterior foot. The area on the right closed over. She uses her own juxta light stockings. She does not have an arterial issue 09/16/17 on evaluation today patient actually appears to be doing excellent in regard to her bilateral lower extremity swelling. The Juxta-Lite compression wrap seem to be doing very well for her. She has not however been using the portion that goes over her foot. Her left foot still is draining a little bit not nearly as significant as it has been in the past but still I do believe that she likely needs to utilize the full wrap including the foot portion of this will improve as well. She also has been apparently putting on a significant amount of Vaseline which also think is not helpful for her. I recommended that if she feels she needs something for moisturizer Eucerin will probably be better. 09/30/17 on evaluation today patient presents with several new open areas in regard to her left lower extremity although these appear to be minimal and mainly seem to be more moisture breakdown than anything. Fortunately she does not seem to have any evidence of infection which is great news. She has been tolerating the dressing changes without complication we are using silver alginate on the foot she has been using AB pads to have the legs and using her Juxta- Lite compression which seems to be controlling her swelling very well. Overall I'm pleased with the poor way she has progressed. 10/14/17 on evaluation today patient appears to be doing better in regard to her left lower extremity areas of weeping. She does still have some discomfort although in general this does not appear to be as macerated and I think it is progressing nicely. I do think she still needs to wear the foot portion of her Juxta- Lite in order to get the most benefit from the wrap  obviously. She states she understands. Fortunately there does not appear to be evidence of infection at this time which is great news. 10/28/17 on evaluation today patient appears to be doing excellent  in regard to her left lower extremity. She has just a couple areas that are still open and seem to be causing any trouble whatsoever. For that reason I think that she is definitely headed in the right direction the spots are very tiny compared to what we have been dealing with in the past. 11/11/17 on evaluation today patient appears to have a right lateral lower extremity ulcer that has opened since I last saw her. She states this is where the home health nurse that was coming out remove the dressing without wetting the alginate first. Nonetheless I do not know if this is indeed the case or not but more importantly we have not ordered home help to be coming out for her wounds at all. I'm unsure as to why they are coming out and we're gonna have to check on this and get things situated in that regard. With that being said we currently really do not need them to be coming out as the patient has been taking care of her leg herself without complication and no issues. In fact she was doing much better prior to nursing coming out. 11/25/17 on evaluation today patient actually appears to be doing fairly well in regard to her left lower extremity swelling. In fact she has very little area of weeping at this point there's just a small spot on the lateral portion of her right leg that still has me just a little bit more concerned as far as wanting to see this clear up before I discharge her to caring for this at home. Nonetheless overall she has made excellent progress. 12/09/17 on evaluation today patient appears to be doing rather well in regard to her lower extremity edema. She does have some weeping still in the left lower extremity although the big area we were taking care of two [name] ago actually has closed  and she has another area of weeping on the left lower extremity immediately as well is the top of her foot. She does not currently have lymphedema pumps she has been wearing her compression daily on a regular basis as directed. With that being said I think she may benefit from lymphedema pumps. 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 We had a very difficult time getting and keeping this under control. 12/23/17 on evaluation today patient actually appears to be doing a little bit more poorly in regard to her bilateral lower extremities. She has been tolerating the Juxta-Lite compression wraps. Unfortunately she has two new ulcers on the right lower extremity and left lower Trinity ulceration seems to be larger. Obviously this is not good news. She has been tolerating the dressings without complication. 12/30/17 on evaluation today patient actually appears to be doing much better in regard to her bilateral lower extremity edema. She continues to have some issues with ulcerations and in fact there appears to be one spot on each leg where the wrap may have caused a little bit of a blister which is subsequently opened up at this point is given her pain. Fortunately it does not appear to be any evidence of infection which is good news. No fevers chills noted. 01/13/18 on evaluation today patient appears to be doing rather well in regard to her bilateral lower extremities. The dressings did get kind of stuck as far as the wound beds are  concerned but again I think this is mainly due to the fact that she actually seems to be showing signs of healing which is good news. She's not having as much drainage therefore she was having more of the dressing sticking. Nonetheless overall I feel like her swelling is dramatically down compared to previous. 01/20/18 on  evaluation today patient unfortunately though she's doing better in most regards has a large blister on the left anterior lower extremity where she is draining quite significantly. Subsequently this is going to need debridement today in order to see what's underneath and ensure she does not continue to trapping fluid at this location. Nonetheless No fevers, chills, nausea, or vomiting noted at this time. 01/27/18 on evaluation today patient appears to be doing rather well at this point in regard to her right lower extremity there's just a very small area that she still has open at this point. With that being said I do believe that she is tolerating the compression wraps very well in making good progress. Home health is coming out at this point to see her. Her left lower extremity on the lateral portion is actually what still mainly open and causing her some discomfort for the most part 02/10/18 on evaluation today patient actually appears to be doing very well in regard to her right lower extremity were all the ulcers appear to be completely close. In regard to the left lower extremity she does have two areas still open and some leaking from the dorsal surface of her foot but this still seems to be doing much better to me in general. 02/24/18 on evaluation today patient actually appears to be doing much better in regard to her right lower extremity this is still completely healed. Her left lower extremity is also doing much better fortunately she has no evidence of infection. The one area that is gonna require some debridement is still on the left anterior shin. Fortunately this is not hurting her as badly today. 03/10/18 on evaluation today patient appears to be doing better in some regards although she has a little bit more open area on the dorsal foot and she also has some issues on the medial portion of the left lower extremity which is actually new and somewhat deep. With that being said there  fortunately does not appear to be any significant signs of infection which is good news. No fevers, chills, nausea, or vomiting noted at this time. In general her swelling seems to be doing fairly well which is good news. 03/31/18 on evaluation today patient presents for follow-up concerning her left lower extremity lymphedema. Unfortunately she has been doing a little bit more poorly since I last saw her in regard to the amount of weeping that she is experiencing. She's also having some increased pain in the anterior shin location. Unfortunately I do not feel like the patient is making such good progress at this point a few [name] back she was definitely doing much better. 04/07/18 on evaluation today patient actually appears to be showing some signs of improvement as far as the left lower extremity is concerned. She has been tolerating the dressing changes and it does appear that the Drawtex did better for her. With that being said unfortunately home health is stating that they cannot obtain the Drawtex going forward. Nonetheless we're gonna have to check and see what they may be able to get the alginate they were using was getting stuck in causing new areas of skin being pulled all  that with and subsequently weep and calls her to worsen overall this is the first time we've seen improvement at this time. 04/14/18 on evaluation today patient actually appears to be doing rather well at this point there does not appear to be any evidence of infection at this time and she is actually doing excellent in regard to the weeping in fact she almost has no openings remaining even compared to just last week this is a dramatic improvement. No fevers chills noted 04/21/18 evaluation today patient actually appears to be doing very well. She in fact is has a small area on the posterior lower extremity location and she has a small area on the dorsal surface of her foot that are still open both of which are very close  to closing. We're hoping this will be close shortly. She brought her Juxta-Lite wrap with her today hoping that would be able to put her in it unfortunately I don't think were quite at that point yet but we're getting closer. 04/28/18 upon evaluation today patient actually appears to be doing excellent in regard to her left lower extremity ulcer. In fact the region on the posterior lower extremity actually is much smaller than previously noted. Overall I'm very happy with the progress she has made. She again did bring her Juxta-Lite although we're not quite ready for that yet. 05/11/18 upon evaluation today patient actually appears to be doing in general fairly well in regard to her left lower Trinity. The swelling is very well controlled. With that being said she has a new area on the left anterior lower extremity as well as between the first and second toes of her left foot that was not present during the last evaluation. The region of her posterior left lower extremity actually appears to be almost completely healed. T be honest I'm very pleased with o the way that stands. Nonetheless I do believe that the lotion may be keeping the area to moist as far as her legs are concerned subsequently I'm gonna consider discontinuing that today. 05/26/18 on evaluation today patient appears to be doing rather well in regard to her left lower should be ulcers. In fact everything appears to be close except for a very small area on the left posterior lower extremity. Fortunately there does not appear to be any evidence of infection at this time. Overall very pleased with her progress. 06/02/18 and evaluation today patient actually appears to be doing very well in regard to her lower extremity ulcers. She has one small area that still continues to weep that I think may benefit her being able to justify lotion and user Juxta-Lite wraps versus continued to wrap her. Nonetheless I think this is something we can  definitely look into at this point. 06/23/18 on evaluation today patient unfortunately has openings of her bilateral lower extremities. In general she seems to be doing much worse than when I last saw her just as far as her overall health standpoint is concerned. She states that her discomfort is mainly due to neuropathy she's not having any other issues otherwise. No fevers, chills, nausea, or vomiting noted at this time. 06/30/18 on evaluation today patient actually appears to be doing a little worse in regard to her right lower extremity her left lower extremity of doing fairly well. Fortunately there is no sign of infection at this time. She has been tolerating the dressing changes without complication. Home health did not come out like they were supposed to for the appropriate  wrap changes. They stated that they never received the orders from [place] which were fax. Nonetheless we will send a copy of the orders with the patient today as well. 07/07/18 on evaluation today patient appears to be doing much better in regard to lower extremities. She still has several openings bilaterally although since I last saw her her legs did show obvious signs of infection when she later saw her nurse. Subsequently a culture was obtained and she is been placed on Bactrim and Keflex. Fortunately things seem to be looking much better it does appear she likely had an infection. Again last week we'd even discussed it but again there really was not any obvious sign that she had infection therefore we held off on the antibiotics. Nonetheless I'm glad she's doing better today. 07/14/18 on evaluation today patient appears to be doing much better regarding her bilateral lower Trinity's. In fact on the right lower for me there's nothing open at this point there are some dry skin areas at the sites where she had infection. Fortunately there is no evidence of systemic infection which is excellent news. No fevers chills noted 07/21/18  on evaluation today patient actually appears to be doing much better in regard to her left lower extremity ulcers. She is making good progress and overall I feel like she's improving each time I see her. She's having no pain I do feel like the infection is completely resolved which is excellent news. No fevers, chills, nausea, or vomiting noted at this time. 07/28/18 on evaluation today patient appears to be doing very well in regard to her left lower [employer]. Everything seems to be showing signs of improvement which is excellent news. Overall very pleased with the progress that has been made. Fortunately there's no evidence of active infection at this time also excellent news. 08/04/18 on evaluation today patient appears to be doing more poorly in regard to her bilateral lower extremities. She has two new areas open up on the right and these were completely closed as of last week. She still has the two spots on the left which in my pinion seem to be doing better. Fortunately there's no evidence of infection again at this point. 08/11/18 on evaluation today patient actually appears to be doing very well in regard to her bilateral lower Trinity wounds that all seem to be doing better and are measures smaller today. Fortunately there's no signs of infection. No fevers, chills, nausea, or vomiting noted at this time. 08/18/18 on evaluation today patient actually appears to be doing about the same inverter bilateral lower extremities. She continues to have areas that blistering open as was drain that fortunately nothing too significant. Overall I feel like Drawtex may have done better for her however compared to the collagen. 08/25/18 on evaluation today patient appears to be doing a little bit more poorly today even compared to last time I saw her. Again I'm not exactly sure why she's making worse progress over the past several [name]. I'm beginning to wonder if there is some kind of underlying low level  infection causing this issue. I did actually take a culture from the left anterior lower extremity but it was a new wound draining quite a bit at this point. Unfortunately she also seems to be having more pain which is what also makes me worried about the possibility of infection. This is despite never erythema noted at this point. 09/01/18 on evaluation today patient actually appears to be doing a little worse  even compared to last week in regard to bilateral lower extremities. She did go to the hospital on the 16th was given a dose of IV Zosyn and then discharged with a recommendation to continue with the Bactrim that I previously prescribed for her. Nonetheless she is still having a lot of discomfort she tells me as well at this time. This is definitely unfortunate. No fevers, chills, nausea, or vomiting noted at this time. 09/08/18 on evaluation today patient's bilateral lower extremities actually appear to be shown signs of improvement which is good news. Fortunately there does not appear to be any signs of active infection I think the anabiotic is helping in this regard. Overall I'm very pleased with how she is progressing. 09/15/18 patient was actually seen in ER yesterday due to her legs as well unfortunately. She states she's been having a lot of pain and discomfort as well as a lot of drainage. Upon inspection today the patient does have a lot of swelling and drainage I feel like this is more related to lymphedema and poor fluid control than it is to infection based on what I'm seeing. The physician in the emergency department also doubted that the patient was having a significant infection nonetheless I see no evidence of infection obvious at this point although I do see evidence of poor fluid control. She still not using a compression pumps, she is not elevating due to her lift chair as well as her hospital bed being broken, and she really is not keeping her legs up as much as they should be and  also has been taking off her wraps. All this combined I think has led to poor fluid control and to be honest she may be somewhat volume overloaded in general as well. I recommend that she may need to contact your physician to see if a prescription for a diuretic would be beneficial in their opinion. As long as this is safe I think it would likely help her. 09/29/18 on evaluation today patient's left lower extremity actually appears to be doing quite a bit better. At least compared to last time that I saw her. She still has a large area where she is draining from but there's a lot of new skin speckled trout and in fact there's more new skin that there are open areas of weeping and drainage at this point. This is good news. With regard to the right lower extremity this is doing much better with the only open area that I really see being a dry spot on the right lateral ankle currently. Fortunately there's no signs of active infection at this time which is good news. No fevers, chills, nausea, or vomiting noted at this time. The patient seems somewhat stressed and overwhelmed during the visit today she was very lethargic as such. She does and she is not taking any pain medications at this point. Apparently according to her husband are also in the process of moving which is probably taking its toll on her as well. 10/06/18 on evaluation today patient appears to be doing rather well in regard to her lower extremities compared to last evaluation. Fortunately there's no signs of active infection. She tells me she did have an appointment with her primary. Nonetheless he was concerned that the wounds were somewhat deep based on pictures but we never actually saw her legs. She states that he had her somewhat worried due to the fact that she was fearing now that she was [place] have to have  an amputation. With that being said based on what I'm seeing check she looks better this week that she has the last two times  I've seen her with much less drainage I'm actually pleased in this regard. That doesn't mean that she's out of the water but again I do not think what the point of talking about education at all in regard to her leg. She is very happy to hear this. She is also not having as much pain as she was having last week. 10/13/18 unfortunately on evaluation today patient still continues to have a significant amount of drainage she's not letting home health actually apply the compression dressings at this point. She's trying to use of Juxta-Lite of the top of Kerlex and the second layer of the three layer compression wrap. With that being said she just does not seem to be making as good a progress as I would expect if she was having the compression applied and in place on a regular basis. No fevers, chills, nausea, or vomiting noted at this time. 10/20/18 on evaluation today patient appears to be doing a little better in regard to her bilateral lower extremity ulcers. In fact the right lower extremity seems to be healed she doesn't even have any openings at this point left lower extremity though still somewhat macerated seems to be showing signs of new skin growth at multiple locations throughout. Fortunately there's no evidence of active infection at this time. No fevers, chills, nausea, or vomiting noted at this time. 10/27/18 on evaluation today patient appears to be doing much better in regard to her left lower Trinity ulcer. She's been tolerating the laptop complication and has minimal drainage noted at this point. Fortunately there's no signs of active infection at this time. No fevers, chills, nausea, or vomiting noted at this time. 11/03/18 on evaluation today patient actually appears to be doing excellent in regard to her left lower extremity. She is having very little drainage at this point there does not appear to be any significant signs of infection overall very pleased with how things have gone. She is  likewise extremely pleased still and seems to be making wonderful progress week to week. I do believe antibiotics were helpful for her. Her primary care provider did place on amateur clean since I last saw her. 11/17/18 on evaluation today patient appears to be doing worse in regard to her bilateral lower extremities at this point. She is been tolerating the dressing changes without complication. With that being said she typically takes the [place] off fairly quickly upon arriving home even after being seen here in the clinic and does not allow home health reapply command as part of the dressing at home. Therefore she said no compression essentially since I last saw her as best I can tell. With that being said I think it shows and how much swelling she has in the open wounds that are noted at this point. Fortunately there's no signs of infection but unfortunately if she doesn't get this under control I think she will end up with infection and more significant issues. 11/24/18 on evaluation today patient actually appears to be doing somewhat better in regard to her bilateral lower extremities. 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 Nonetheless that is doing better currently so she should be  able to attempt reinitiating the lymphedema pumps at this time. No fevers, chills, nausea, or vomiting noted at this time. 12/01/18 upon evaluation today patient's left lower extremity appears to be doing quite well unfortunately her right lower extremity is not doing nearly as well. She has been tolerating the dressing changes without complication unfortunately she did not keep a wrap on the right at this time. Nonetheless I believe this has led to increased swelling and weeping in the world is actually much larger than during the last evaluation with her. 12/08/18 on  evaluation today patient appears to be doing about the same at this point in regard to her right lower extremity. There is some more palatable to touch I'm concerned about the possibility of there being some infection although I think the main issue is she's not keeping her compression wrap on which in turn is not allowing this area to heal appropriately. 12/22/18 on evaluation today patient appears to be doing better in regard to left lower extremity unfortunately significantly worse in regard to the right lower extremity. The areas of blistering and necrotic superficial tissue have spread and again this does not really appear to be signs of infection and all she just doesn't seem to be doing nearly as well is what she has been in the past. Overall I feel like the Augmentin did absolutely nothing for her she doesn't seem to have any infection again I really didn't think so last time either is more of a potential preventative measure and hoping that this would make some difference but I think the main issue is she's not wearing her compression. She tells me she cannot wear the [place] been we put on she takes it off pretty much upon getting home. Subsequently she worshiped Juxta-Lite when I questioned her about how often she wears it this is no more than three hours a day obviously that leaves 21 hours that she has no compression and this is obviously not doing well for her. Overall I'm concerned that if things continue to worsen she is at great risk of both infection as well as losing her leg. 01/05/19 on evaluation today patient appears to be doing well in regard to her left lower extremity which he is allowing [place] to wrap and not so well with regard to her right lower extremity which she is not allowing [place] to really wrap and keep the wrap on. She states that it hurts too badly whenever it's wrapped and she ends up having to take it off. She's been using the Juxta-Lite she tells me up to six hours a day  although I question whether or not that's really been the case to be honest. Previously she told me three hours today nonetheless obviously the legs as long as the wrap is doing great when she is not is doing much more poorly. 01/12/2019 on evaluation today patient actually appears to be doing a little better in my opinion with regard to her right lower extremity ulcer. She has a small open area on the left lower extremity unfortunately but again this I think is part of the normal fluctuation of what she is going to have to expect with regard to her legs especially when she is not using her lymphedema pumps on a regular basis. Subsequently based on what I am seeing today I think that she does seem to be doing slightly better with regard to her right lower extremity she did see her primary care provider on Monday they  felt she had an infection and placed her on 2 antibiotics. Both Cipro and clindamycin. Subsequently again she seems possibly to be doing a little bit better in regards to the right lower extremity she also tells me however she has been wearing the compression wrap over the past week since I spoke with her as well that is a Kerlix and Coban wrap on the right. No fevers, chills, nausea, vomiting, or diarrhea. 01/19/2019 on evaluation today patient appears to be doing better with regard to her bilateral lower extremities especially the right. I feel like the compression has been beneficial for her which is great news. She did get a call from her primary care provider on her way here today telling her that she did have methicillin- resistant Staphylococcus aureus and he was calling in a couple new antibiotics for her including a ointment to be applied she tells me 3 times a day. With that being said this sounds like likely to be Bactroban which I think could be applied with each dressing/wrap change but I would not be able to accommodate her applying this 3 times a day. She is in agreement with  the least doing this we will add that to her orders today. 01/26/2019 on evaluation today patient actually appears to be doing much better with regard to her right lower extremity. Her left lower extremity is also doing quite well all things considering. Fortunately there is no evidence of active infection at this time. No fevers, chills, nausea, vomiting, or diarrhea. 02/02/2019 on evaluation today patient appears to be doing much better compared to her last evaluation. Little by little off like her right leg is returning more towards normal. There does not appear to be any signs of active infection and overall she seems to be doing quite well which is great news. I am very pleased in this regard. No fevers, chills, nausea, vomiting, or diarrhea. 02/09/2019 upon evaluation today patient appears to be doing better with regard to her bilateral lower extremities. She has been tolerating the dressing changes without complication. Fortunately there is no signs of active infection at this time. No fevers, chills, nausea, vomiting, or diarrhea. 02/23/2019 on evaluation today patient actually appears to be doing quite well with regard to her bilateral lower extremities. She has been tolerating the dressing changes without complication. She is even used her pumps one time and states that she really felt like it felt good. With that being said she seems to be in good spirits and her legs appear to be doing excellent. 03/09/2019 on evaluation today patient appears to be doing well with regard to her right lower extremity there are no open wounds at this time she is having some discomfort but I feel like this is more neuropathy than anything. With regard to her left lower extremity she had several areas scattered around that she does have some weeping and drainage from but again overall she does not appear to be having any significant issues and no evidence of infection at this time which is good news. 03/23/2019 on  evaluation today patient appears to be doing well with regard to her right lower extremity which she tells me is still close she is using her juxta light here. Her left lower extremity she mainly just has an area on the foot which is still slightly draining although this also is doing great. Overall very pleased at this time. 04/06/2019 patient appears to be doing a little bit worse in regard to her left  lower extremity upon evaluation today. She feels like this could be becoming infected again which she had issues with previous. Fortunately there is no signs of systemic infection but again this is always a struggle with her with her legs she will go from doing well to not so well in a very short amount of time. 04/20/2019 on evaluation today patient actually appears to be doing quite well with regard to her right lower extremity I do not see any signs of active infection at this time. Fortunately there is no fever chills noted. She is still taking the antibiotics which I prescribed for her at this point. In regard to the left lower extremity I do feel like some of these areas are better although again she still is having weeping from several locations at this time. 04/27/2019 on evaluation today patient appears to be doing about the same if not slightly worse in regard to her left lower extremity ulcers. She tells me when questioned that she has been sleeping in her Hoveround chair in fact she tells me she falls asleep without even knowing it. I think she is spending a whole lot of time in the chair and less time walking and moving around which is not good for her legs either. On top of that she is in a seated position which is also the worst position she is not really elevating her legs and she is also not using her lymphedema pumps. All this is good to contribute to worsening of her condition in general. 05/18/2019 on evaluation today patient appears to be doing well with regard to her lower extremity  on the right in fact this is showing no signs of any open wounds at this time. On the left she is continuing to have issues with areas that do drain. Some of the regions have healed and there are couple areas that have reopened. She did go to the ER per the patient according to recommendations from the home health nurse due to what she was seen when she came out on 05/13/2019. Subsequently she felt like the patient needed to go to the hospital due to the fact that again she was having "milky white discharge" from her leg. Nonetheless she had and then was placed on doxycycline and subsequently seems to be doing better. 06/01/2019 upon evaluation today patient appears to be doing really in my opinion about the same. I do not see any signs of active infection which is good news. Overall she still has wounds over the bilateral lower extremities she has reopened on the right but this appears to be more of a crack where there is weeping/edema coming from the region. I do not see any evidence of infection at either site based on what I visualized today. 07/13/2019 upon evaluation today patient appears to be doing a little worse compared to last time I saw her. She since has been in the hospital from 06/21/2019 through 06/29/2019. This was secondary to having Covid. During that time they did apply lotion to her legs which unfortunately has caused her to develop a myriad of open wounds on her lower extremities. Her legs do appear to be doing better as far as the overall appearance is concerned but nonetheless she does have more open and weeping areas. 07/27/2019 upon evaluation today patient appears to be doing more poorly to be honest in regard to her left lower extremity in particular. There is no signs of systemic infection although I do believe she may have  local infection. She notes she has been having a lot of blue/green drainage which is consistent potentially with Pseudomonas. That may be something that  we need to consider here as well. The doxycycline does not seem to have been helping. 08/03/2019 upon evaluation today patient appears to be doing a little better in my opinion compared to last week's evaluation. Her culture I did review today and she is on appropriate medications to help treat the Enterobacter that was noted. Overall I feel like that is good news. With that being said she is unfortunately continuing to have a lot of drainage and though it is doing better I still think she has a long ways to go to get things dried up in general. Fortunately there is no signs of systemic infection. 08/10/2019 upon evaluation today patient appears to be doing may be slightly better in regard to her left lower extremity the right lower extremity is doing much better. Fortunately there is no signs of infection right now which is good news. No fevers, chills, nausea, vomiting, or diarrhea. 08/24/2019 on evaluation today patient appears to be doing slightly better in regard to her lower extremities. The left lower extremity seems to be healed the right lower extremity is doing better though not completely healed as far as the openings are concerned. She has some generalized issues here with edema and weeping secondary to her lymphedema though again I do believe this is little bit drier compared to prior [name] evaluations. In general I am very pleased with how things seem to be progressing. No fevers, chills, nausea, vomiting, or diarrhea. 08/31/2019 upon evaluation today patient actually seems to making some progress here with regard to the left lower extremity in particular. She has been tolerating the dressing changes without complication. Fortunately there is no signs of active infection at this time. No fevers, chills, nausea, vomiting, or diarrhea. She did see Dr. Doren Custard and he did note that she did have a issue with the left great saphenous vein and the small saphenous vein in the leg. With that being said  he was concerned about the possibility of laser ablation not being extremely successful. He also mentioned a small risk of DVT associated with the procedure. However if the wounds do not continue to improve he stated that that would probably be the way to go. Fortunately the patient's legs do seem to be doing much better. 09/07/2019 upon evaluation today patient appears to be doing better with regard to her lower extremities. She has been tolerating the dressing changes without complication. With that being said she is showing signs of improvement and overall very pleased. There are some areas on her leg that I think we do need to debride we discussed this last week the patient is in agreement with doing that as long as it does not hurt too badly. 09/14/2019 upon evaluation today patient appears to be doing decently well with regard to her left lower extremity. She is not having near as much weeping as she has had in the past things seem to be drying up which is good news. There is no signs of active infection at this time. 09/21/19 upon evaluation today patient appears to be doing better in regard overall to her bilateral lower extremities. She again has less open than she did previous and each week I feel like this is getting better. Fortunately there is no signs of active infection at this time. No fevers, chills, nausea, vomiting, or diarrhea. 09/28/2019 upon evaluation today  patient actually appears to be showing signs of improvement with regard to her left lower extremity. Unfortunately the right medial lower extremity around the ankle region has reopened to some degree but this appears to be minimal still which is good news. There is no signs of active infection at this time which is also good news. 10/12/2019 upon evaluation today patient appears to be doing okay with regard to her bilateral lower extremities today. The right is a little bit worse then last evaluation 2 [name] ago. The left is  actually doing a little better in my opinion. Overall there is no signs of active infection at this time that I see. Obviously that something we have to keep a close eye on she is very prone to this with the significant and multiple openings that she has over the bilateral lower extremities. 10/19/2019 upon evaluation today patient appears to be doing about the best that I have seen her in quite some time. She has been tolerating the dressing changes without complication. There does not appear to be any signs of active infection and overall I am extremely happy with the way her legs appeared. She is drying up quite nicely and overall is having less pain. 11/09/2019 upon evaluation today patient appears to be doing better in regard to her wounds. She seems to be drying up more and more each time I see her this is just taking a very long time. Fortunately there is no signs of active infection at this time. 11/23/2019 upon evaluation today patient actually appears to be doing excellent in regard to her lower extremities at this point compared to where she has been. Fortunately there is no signs of active infection at this time. She did go to the hospital last week for nausea and vomiting completely unrelated to her wounds. Fortunately she is doing better she was given some Reglan and got better. She had associated abdominal pain but they never found out what was going on. 12/07/2019 upon evaluation today patient appears to be doing well for the most part in regard to her legs. She unfortunately has not been keeping the Coban portion of her wraps on therefore the compression has not really been sufficient for what it is supposed to be. Nonetheless she tells me that it just hurt too bad therefore she removed it. 12/21/2019 upon evaluation today patient actually appears to be doing quite well with regard to her legs. I do feel like she has been making progress which is great news and overall there is no signs of  active infection at this time. No fevers, chills, nausea, vomiting, or diarrhea. Objective Constitutional Well-nourished and well-hydrated in no acute distress. Vitals Time Taken: 10:45 AM, Height: 62 in, Weight: 335 lbs, BMI: 61.3, Temperature: 98.3 F, Pulse: 77 bpm, Respiratory Rate: 19 breaths/min, Blood Pressure: 136/79 mmHg. Respiratory normal breathing without difficulty. Psychiatric this patient is able to make decisions and demonstrates good insight into disease process. Alert and Oriented x 3. pleasant and cooperative. General Notes: Patient's wound bed currently showed signs of good granulation and epithelization and a lot of areas she still does have a lot of weeping but nonetheless I feel like we are making progress here which is good news this is very slow but nonetheless progress. I did not need to perform any sharp debridement today. Integumentary (Hair, Skin) Wound #61 status is Open. Original cause of wound was Gradually Appeared. The wound is located on the Left,Circumferential Lower Leg. The wound measures 16cm length  x 33.5cm width x 0.1cm depth; 420.973cm^2 area and 42.097cm^3 volume. There is Fat Layer (Subcutaneous Tissue) Exposed exposed. There is no tunneling or undermining noted. There is a large amount of serous drainage noted. The wound margin is flat and intact. There is medium (34-66%) pink, pale granulation within the wound bed. There is a medium (34-66%) amount of necrotic tissue within the wound bed including Adherent Slough. Wound #64 status is Open. Original cause of wound was Gradually Appeared. The wound is located on the Left,Dorsal Foot. The wound measures 6.5cm length x 6cm width x 0.1cm depth; 30.631cm^2 area and 3.063cm^3 volume. There is Fat Layer (Subcutaneous Tissue) Exposed exposed. There is no tunneling or undermining noted. There is a medium amount of serous drainage noted. The wound margin is flat and intact. There is large (67-100%) pink, pale  granulation within the wound bed. There is a small (1-33%) amount of necrotic tissue within the wound bed including Adherent Slough. Wound #65 status is Open. Original cause of wound was Gradually Appeared. The wound is located on the Right,Medial Lower Leg. The wound measures 3cm length x 1.7cm width x 0.1cm depth; 4.006cm^2 area and 0.401cm^3 volume. There is Fat Layer (Subcutaneous Tissue) Exposed exposed. There is no tunneling or undermining noted. There is a medium amount of serosanguineous drainage noted. The wound margin is distinct with the outline attached to the wound base. There is large (67-100%) pink granulation within the wound bed. There is no necrotic tissue within the wound bed. 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 There was a Three Layer Compression Therapy Procedure by Zandra Abts, RN. Post procedure Diagnosis Wound #61: Same as Pre-Procedure Wound #65 Pre-procedure diagnosis of Wound #65 is a Venous Leg Ulcer located on the Right,Medial Lower Leg . There was a Three Layer Compression Therapy Procedure by Zandra Abts, RN. Post procedure Diagnosis Wound #65: Same as Pre-Procedure Plan Follow-up Appointments: Return  Appointment in 2 [name]. Dressing Change Frequency: Wound #61 Left,Circumferential Lower Leg: Change dressing three times week. Wound #64 Left,Dorsal Foot: Change dressing three times week. Wound #65 Right,Medial Lower Leg: Change dressing three times week. Skin Barriers/Peri-Wound Care: Barrier cream - zinc oxide cream to any macerated areas Moisturizing lotion - to dry skin Wound Cleansing: Clean wound with Wound Cleanser - all wounds, wash legs with soap and water with dressing changes May shower with protection. Primary Wound Dressing: Wound #61 Left,Circumferential Lower Leg: Calcium Alginate with Silver Wound #64 Left,Dorsal Foot: Calcium Alginate with Silver Wound #65 Right,Medial Lower Leg: Calcium Alginate with Silver Secondary Dressing: Wound #61 Left,Circumferential Lower Leg: Dry Gauze - all wounds ABD pad - as needed Wound #64 Left,Dorsal Foot: Dry Gauze - all wounds ABD pad - as needed Wound #65 Right,Medial Lower Leg: Dry Gauze Edema Control: 3 Layer Compression System - Bilateral Avoid standing for long periods of time - walking is  encouraged Elevate legs to the level of the heart or above for 30 minutes daily and/or when sitting, a frequency of: - do not sleep in chair with feet dangling, MUST elevate legs while sitting Exercise regularly Segmental Compressive Device. - lymphedema pumps 60 minutes 1- 2 times per day Off-Loading: Turn and reposition every 2 hours Additional Orders / Instructions: Follow Nutritious Diet - T include vitamin A, vitamin C, and Zinc along with increased protein intake. o Home Health: [facility] for [facility]. - [facility]. I would recommend currently that we go ahead and initiate treatment with a continuation of the 3 layer compression wrap that seems to be beneficial for the patient. 2. I am also can recommend that we continue with zinc over the good skin as well as a calcium alginate with silver in  general. 3. I would recommend the patient needs to continue to use her pumps and elevate her legs I am not sure she is really been doing a whole lot of either especially using the pumps. We will see patient back for reevaluation in 2 [name] here in the clinic. If anything worsens or changes patient will contact our office for additional recommendations. Electronic Signature(s) Signed: 12/21/2019 11:18:02 AM By: [name] PA-C Entered By: [name] on 12/21/2019 11:18:01 -------------------------------------------------------------------------------- SuperBill Details Patient Name: Date of Service: [name] 12/21/2019 Medical Record Number: [id number] Patient Account Number: [id number] Date of Birth/Sex: Treating RN: 1951-05-05 (69 y.o. [name] Primary Care Provider: Dustin [name] Other Clinician: Referring Provider: Treating Provider/Extender: [name], [name] in Treatment: 124 Diagnosis Coding ICD-10 Codes Code Description E11.622 Type 2 diabetes mellitus with other skin ulcer I89.0 Lymphedema, not elsewhere classified I87.331 Chronic venous hypertension (idiopathic) with ulcer and inflammation of right lower extremity I87.332 Chronic venous hypertension (idiopathic) with ulcer and inflammation of left lower extremity L97.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 leg with fat layer exposed L97.822 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 I10 Essential (primary) hypertension E66.01 Morbid (severe) obesity due to excess calories F41.8 Other specified anxiety disorders R53.1 Weakness Facility Procedures The patient participates with Medicare or their insurance follows the Medicare Facility Guidelines: CPT4 Description Modifier Quantity Code [id number] BILATERAL: Application of multi-layer  venous compression system; leg (below knee), including ankle and 1 foot. Physician Procedures : CPT4 Code Description Modifier [id number] - WC PHYS LEVEL 3 - EST PT ICD-10 Diagnosis Description E11.622 Type 2 diabetes mellitus with other skin ulcer I89.0 Lymphedema, not elsewhere classified I87.331 Chronic venous hypertension (idiopathic) with  ulcer and inflammation of right lower extremity I87.332 Chronic venous hypertension (idiopathic) with ulcer and inflammation of left lower extremity Quantity: 1 Electronic Signature(s) Signed: 12/21/2019 11:18:17 AM By: [name] PA-C Entered By: [name] on 12/21/2019 11:18:15

## 2019-12-21 NOTE — Progress Notes (Signed)
[name], [name] ([id number]) Visit Report for 12/21/2019 Arrival Information Details Patient Name: Date of Service: [name], [name] 12/21/2019 10:30 A M Medical Record Number: [id number] Patient Account Number: [id number] Date of Birth/Sex: Treating RN: 29-Aug-1950 (69 y.o. [name] Primary Care : [name] Other Clinician: Referring : Treating /Extender: Doyle Askew, FRED Weeks in Treatment: 124 Visit Information History Since Last Visit Added or deleted any medications: No Patient Arrived: Wheel Chair Any new allergies or adverse reactions: No Arrival Time: 10:46 Had a fall or experienced change in No Accompanied By: self activities of daily living that may affect Transfer Assistance: None risk of falls: Patient Identification Verified: Yes Signs or symptoms of abuse/neglect since last visito No Secondary Verification Process Completed: Yes Hospitalized since last visit: No Patient Requires Transmission-Based Precautions: No Implantable device outside of the clinic excluding No Patient Has Alerts: Yes cellular tissue based products placed in the center Patient Alerts: R ABI= 1.01 since last visit: L ABI = .99 Has Dressing in Place as Prescribed: Yes Pain Present Now: No Electronic Signature(s) Signed: 12/21/2019 5:12:52 PM By: Kela Millin Entered By: Kela Millin on 12/21/2019 10:47:21 -------------------------------------------------------------------------------- Compression Therapy Details Patient Name: Date of Service: [name]. 12/21/2019 10:30 A M Medical Record Number: [id number] Patient Account Number: [id number] Date of Birth/Sex: Treating RN: January 23, 1951 (69 y.o. [name] Primary Care : [name] Other Clinician: Referring : Treating /Extender: Doyle Askew, FRED Weeks in Treatment: 124 Compression Therapy Performed for Wound Assessment: Wound #61  Left,Circumferential Lower Leg Performed By: Clinician Levan Hurst, RN Compression Type: Three Layer Post Procedure Diagnosis Same as Pre-procedure Electronic Signature(s) Signed: 12/21/2019 5:28:41 PM By: [name] RN, BSN Entered By: [name] on 12/21/2019 11:12:31 -------------------------------------------------------------------------------- Compression Therapy Details Patient Name: Date of Service: [name]. 12/21/2019 10:30 A M Medical Record Number: [id number] Patient Account Number: [id number] Date of Birth/Sex: Treating RN: 1950-08-31 (69 y.o. [name] Primary Care : [name] Other Clinician: Referring : Treating /Extender: Doyle Askew, FRED Weeks in Treatment: 124 Compression Therapy Performed for Wound Assessment: Wound #65 Right,Medial Lower Leg Performed By: Clinician Levan Hurst, RN Compression Type: Three Layer Post Procedure Diagnosis Same as Pre-procedure Electronic Signature(s) Signed: 12/21/2019 5:28:41 PM By: [name] RN, BSN Entered By: [name] on 12/21/2019 11:12:31 -------------------------------------------------------------------------------- Encounter Discharge Information Details Patient Name: Date of Service: [name]. 12/21/2019 10:30 A M Medical Record Number: [id number] Patient Account Number: [id number] Date of Birth/Sex: Treating RN: August 08, 1950 (69 y.o. [name] Primary Care : [name] Other Clinician: Referring : Treating /Extender: Doyle Askew, FRED Weeks in Treatment: [phone] Encounter Discharge Information Items Discharge Condition: Stable Ambulatory Status: Wheelchair Discharge Destination: Home Transportation: Other Accompanied By: self Schedule Follow-up Appointment: Yes Clinical Summary of Care: Patient Declined Electronic Signature(s) Signed: 12/21/2019 5:12:52 PM By: Kela Millin Entered By: Kela Millin on 12/21/2019 11:47:31 -------------------------------------------------------------------------------- Lower Extremity Assessment Details Patient Name: Date of Service: [name], [name]. 12/21/2019 10:30 A M Medical Record Number: [id number] Patient Account Number: [id number] Date of Birth/Sex: Treating RN: 12-03-1950 (69 y.o. [name] Primary Care : [name] Other Clinician: Referring : Treating /Extender: Doyle Askew, FRED Weeks in Treatment: 124 Edema Assessment Assessed: [Left: No] [Right: No] Edema: [Left: Yes] [Right: Yes] Calf Left: Right: Point of Measurement: 37 cm From Medial Instep 45 cm 41 cm Ankle Left: Right: Point of Measurement: 10 cm From Medial Instep 31 cm 24 cm Vascular  Assessment Pulses: Dorsalis Pedis Palpable: [Left:No] [Right:No] Electronic Signature(s) Signed: 12/21/2019 5:12:52 PM By: Kela Millin Entered By: Kela Millin on 12/21/2019 10:49:39 -------------------------------------------------------------------------------- Multi-Disciplinary Care Plan Details Patient Name: Date of Service: [name]. 12/21/2019 10:30 A M Medical Record Number: [id number] Patient Account Number: [id number] Date of Birth/Sex: Treating RN: Jul 01, 1950 (69 y.o. [name] Primary Care Chazz Philson: [name] Other Clinician: Referring Daisie Haft: Treating Quentavious Rittenhouse/Extender: Doyle Askew, FRED Weeks in Treatment: [phone] Active Inactive Venous Leg Ulcer Nursing Diagnoses: Actual venous Insuffiency (use after diagnosis is confirmed) Knowledge deficit related to disease process and management Goals: Patient will maintain optimal edema control Date Initiated: 08/12/2017 Target Resolution Date: 01/18/2020 Goal Status: Active Patient/caregiver will verbalize understanding of disease process and disease management Date Initiated: 08/12/2017 Date Inactivated:  04/21/2018 Target Resolution Date: 04/24/2018 Goal Status: Met Interventions: Assess peripheral edema status every visit. Compression as ordered Treatment Activities: Therapeutic compression applied : 08/12/2017 Notes: Wound/Skin Impairment Nursing Diagnoses: Impaired tissue integrity Knowledge deficit related to ulceration/compromised skin integrity Goals: Patient/caregiver will verbalize understanding of skin care regimen Date Initiated: 08/12/2017 Target Resolution Date: 01/18/2020 Goal Status: Active Ulcer/skin breakdown will have a volume reduction of 30% by week 4 Date Initiated: 08/05/2017 Date Inactivated: 09/30/2017 Target Resolution Date: 10/03/2017 Goal Status: Met Ulcer/skin breakdown will have a volume reduction of 50% by week 8 Date Initiated: 09/30/2017 Date Inactivated: 10/28/2017 Target Resolution Date: 10/28/2017 Goal Status: Met Interventions: Assess patient/caregiver ability to perform ulcer/skin care regimen upon admission and as needed Assess ulceration(s) every visit Provide education on ulcer and skin care Screen for HBO Treatment Activities: Patient referred to home care : 08/05/2017 Skin care regimen initiated : 08/05/2017 Topical wound management initiated : 08/05/2017 Notes: Electronic Signature(s) Signed: 12/21/2019 5:28:41 PM By: [name] RN, BSN Entered By: [name] on 12/21/2019 11:08:53 -------------------------------------------------------------------------------- Pain Assessment Details Patient Name: Date of Service: [name]. 12/21/2019 10:30 A M Medical Record Number: [id number] Patient Account Number: [id number] Date of Birth/Sex: Treating RN: 04-03-1951 (69 y.o. [name] Primary Care Ysabel Cowgill: [name] Other Clinician: Referring Jarita Raval: Treating Harshan Kearley/Extender: Doyle Askew, FRED Weeks in Treatment: [phone] Active Problems Location of Pain Severity and Description of Pain Patient Has Paino  No Site Locations Pain Management and Medication Current Pain Management: Electronic Signature(s) Signed: 12/21/2019 5:12:52 PM By: Kela Millin Entered By: Kela Millin on 12/21/2019 10:47:53 -------------------------------------------------------------------------------- Patient/Caregiver Education Details Patient Name: Date of Service: [name] 7/7/2021andnbsp10:30 A M Medical Record Number: [id number] Patient Account Number: [id number] Date of Birth/Gender: Treating RN: February 27, 1951 (69 y.o. [name] Primary Care Physician: [name] Other Clinician: Referring Physician: Treating Physician/Extender: Doyle Askew, FRED Weeks in Treatment: [phone] Education Assessment Education Provided To: Patient Education Topics Provided Venous: Methods: Explain/Verbal Responses: Reinforcements needed, State content correctly Wound/Skin Impairment: Methods: Explain/Verbal Responses: Reinforcements needed, State content correctly Electronic Signature(s) Signed: 12/21/2019 5:28:41 PM By: [name] RN, BSN Entered By: [name] on 12/21/2019 11:10:05 -------------------------------------------------------------------------------- Wound Assessment Details Patient Name: Date of Service: [name]. 12/21/2019 10:30 A M Medical Record Number: [id number] Patient Account Number: [id number] Date of Birth/Sex: Treating RN: 11/08/1950 (69 y.o. [name] Primary Care Enyla Lisbon: [name] Other Clinician: Referring Montee Tallman: Treating Lee Kuang/Extender: Doyle Askew, FRED Weeks in Treatment: 124 Wound Status Wound Number: 61 Primary Venous Leg Ulcer Etiology: Wound Location: Left, Circumferential Lower Leg Wound Open Wounding Event: Gradually Appeared Status: Date Acquired: 04/20/2019 Comorbid Asthma, Hypertension, Peripheral Arterial Disease, Peripheral Weeks Of Treatment: 35 History:  Venous Disease, Type II Diabetes,  Gout, Osteoarthritis Clustered Wound: Yes Photos Photo Uploaded By: Mikeal Hawthorne on 12/21/2019 15:16:48 Wound Measurements Length: (cm) 16 Width: (cm) 33.5 Depth: (cm) 0.1 Clustered Quantity: 3 Area: (cm) 420.973 Volume: (cm) 42.097 % Reduction in Area: -5483.2% % Reduction in Volume: -5483.2% Epithelialization: Small (1-33%) Tunneling: No Undermining: No Wound Description Classification: Full Thickness Without Exposed Support Structures Wound Margin: Flat and Intact Exudate Amount: Large Exudate Type: Serous Exudate Color: amber Foul Odor After Cleansing: No Slough/Fibrino Yes Wound Bed Granulation Amount: Medium (34-66%) Exposed Structure Granulation Quality: Pink, Pale Fascia Exposed: No Necrotic Amount: Medium (34-66%) Fat Layer (Subcutaneous Tissue) Exposed: Yes Necrotic Quality: Adherent Slough Tendon Exposed: No Muscle Exposed: No Joint Exposed: No Bone Exposed: No Treatment Notes Wound #61 (Left, Circumferential Lower Leg) 1. Cleanse With Wound Cleanser Soap and water 2. Periwound Care Barrier cream Moisturizing lotion 3. Primary Dressing Applied Calcium Alginate Ag 4. Secondary Dressing ABD Pad 6. Support Layer Applied 3 layer compression wrap Notes netting Electronic Signature(s) Signed: 12/21/2019 5:12:52 PM By: Kela Millin Entered By: Kela Millin on 12/21/2019 10:52:40 -------------------------------------------------------------------------------- Wound Assessment Details Patient Name: Date of Service: [name]. 12/21/2019 10:30 A M Medical Record Number: [id number] Patient Account Number: [id number] Date of Birth/Sex: Treating RN: August 22, 1950 (69 y.o. [name] Primary Care Nayara Taplin: [name] Other Clinician: Referring Sanika Brosious: Treating Jaben Benegas/Extender: Doyle Askew, FRED Weeks in Treatment: 124 Wound Status Wound Number: 64 Primary Diabetic Wound/Ulcer of the Lower Extremity Etiology: Wound  Location: Left, Dorsal Foot Wound Open Wounding Event: Gradually Appeared Status: Date Acquired: 06/01/2019 Comorbid Asthma, Hypertension, Peripheral Arterial Disease, Peripheral Weeks Of Treatment: 29 History: Venous Disease, Type II Diabetes, Gout, Osteoarthritis Clustered Wound: No Photos Photo Uploaded By: Mikeal Hawthorne on 12/21/2019 15:17:02 Wound Measurements Length: (cm) 6.5 Width: (cm) 6 Depth: (cm) 0.1 Area: (cm) 30.631 Volume: (cm) 3.063 % Reduction in Area: -18464.2% % Reduction in Volume: -6151% Epithelialization: Small (1-33%) Tunneling: No Undermining: No Wound Description Classification: Grade 1 Wound Margin: Flat and Intact Exudate Amount: Medium Exudate Type: Serous Exudate Color: amber Foul Odor After Cleansing: No Slough/Fibrino Yes Wound Bed Granulation Amount: Large (67-100%) Exposed Structure Granulation Quality: Pink, Pale Fascia Exposed: No Necrotic Amount: Small (1-33%) Fat Layer (Subcutaneous Tissue) Exposed: Yes Necrotic Quality: Adherent Slough Tendon Exposed: No Muscle Exposed: No Joint Exposed: No Bone Exposed: No Treatment Notes Wound #64 (Left, Dorsal Foot) 1. Cleanse With Wound Cleanser Soap and water 2. Periwound Care Barrier cream Moisturizing lotion 3. Primary Dressing Applied Calcium Alginate Ag 4. Secondary Dressing ABD Pad 6. Support Layer Applied 3 layer compression wrap Notes netting Electronic Signature(s) Signed: 12/21/2019 5:12:52 PM By: Kela Millin Entered By: Kela Millin on 12/21/2019 10:53:18 -------------------------------------------------------------------------------- Wound Assessment Details Patient Name: Date of Service: [name]. 12/21/2019 10:30 A M Medical Record Number: [id number] Patient Account Number: [id number] Date of Birth/Sex: Treating RN: 05-24-1951 (69 y.o. [name] Primary Care Leara Rawl: [name] Other Clinician: Referring Detric Scalisi: Treating  Gearold Wainer/Extender: Doyle Askew, FRED Weeks in Treatment: 124 Wound Status Wound Number: 65 Primary Venous Leg Ulcer Etiology: Wound Location: Right, Medial Lower Leg Wound Open Wounding Event: Gradually Appeared Status: Date Acquired: 09/28/2019 Comorbid Asthma, Hypertension, Peripheral Arterial Disease, Peripheral Weeks Of Treatment: 12 History: Venous Disease, Type II Diabetes, Gout, Osteoarthritis Clustered Wound: No Photos Photo Uploaded By: Mikeal Hawthorne on 12/21/2019 15:17:17 Wound Measurements Length: (cm) 3 Width: (cm) 1.7 Depth: (cm) 0.1 Area: (cm) 4.006 Volume: (cm) 0.401 % Reduction in Area: -264.2% %  Reduction in Volume: -264.5% Epithelialization: Medium (34-66%) Tunneling: No Undermining: No Wound Description Classification: Full Thickness Without Exposed Support Struct Wound Margin: Distinct, outline attached Exudate Amount: Medium Exudate Type: Serosanguineous Exudate Color: red, brown ures Foul Odor After Cleansing: No Slough/Fibrino No Wound Bed Granulation Amount: Large (67-100%) Exposed Structure Granulation Quality: Pink Fascia Exposed: No Necrotic Amount: None Present (0%) Fat Layer (Subcutaneous Tissue) Exposed: Yes Tendon Exposed: No Muscle Exposed: No Joint Exposed: No Bone Exposed: No Treatment Notes Wound #65 (Right, Medial Lower Leg) 1. Cleanse With Wound Cleanser Soap and water 2. Periwound Care Barrier cream Moisturizing lotion 3. Primary Dressing Applied Calcium Alginate Ag 4. Secondary Dressing ABD Pad 6. Support Layer Applied 3 layer compression wrap Notes netting Electronic Signature(s) Signed: 12/21/2019 5:12:52 PM By: Kela Millin Entered By: Kela Millin on 12/21/2019 10:54:32 -------------------------------------------------------------------------------- Vitals Details Patient Name: Date of Service: [name]. 12/21/2019 10:30 A M Medical Record Number: [id number] Patient Account  Number: [id number] Date of Birth/Sex: Treating RN: May 30, 1951 (69 y.o. [name] Primary Care : [name] Other Clinician: Referring : Treating /Extender: Doyle Askew, FRED Weeks in Treatment: 124 Vital Signs Time Taken: 10:45 Temperature (F): 98.3 Height (in): 62 Pulse (bpm): 77 Weight (lbs): 335 Respiratory Rate (breaths/min): 19 Body Mass Index (BMI): 61.3 Blood Pressure (mmHg): 136/79 Reference Range: 80 - 120 mg / dl Electronic Signature(s) Signed: 12/21/2019 5:12:52 PM By: Kela Millin Entered By: Kela Millin on 12/21/2019 10:47:44

## 2020-01-04 ENCOUNTER — Other Ambulatory Visit: Payer: Self-pay

## 2020-01-04 ENCOUNTER — Encounter (HOSPITAL_BASED_OUTPATIENT_CLINIC_OR_DEPARTMENT_OTHER): Payer: Medicare PPO | Admitting: Physician Assistant

## 2020-01-04 DIAGNOSIS — E11622 Type 2 diabetes mellitus with other skin ulcer: Secondary | ICD-10-CM | POA: Diagnosis not present

## 2020-01-04 NOTE — Progress Notes (Addendum)
[name], [name] ([id number]) Visit Report for 01/04/2020 Chief Complaint Document Details Patient Name: Date of Service: [name], [name] 01/04/2020 10:00 A M Medical Record Number: [id number] Patient Account Number: [id number] Date of Birth/Sex: Treating RN: 04-May-1951 (69 y.o. [name] Primary Care Provider: [name] Other Clinician: Referring Provider: Treating Provider/Extender: Doyle Askew, FRED Weeks in Treatment: [phone] Information Obtained from: Patient Chief Complaint Bilateral reoccurring LE ulcers Electronic Signature(s) Signed: 01/04/2020 10:12:31 AM By: [name] PA-C Entered By: [name] on 01/04/2020 10:12:31 -------------------------------------------------------------------------------- HPI Details Patient Name: Date of Service: [name]. 01/04/2020 10:00 A M Medical Record Number: [id number] Patient Account Number: [id number] Date of Birth/Sex: Treating RN: 1950-08-23 (69 y.o. [name] Primary Care Provider: [name] Other Clinician: Referring Provider: Treating Provider/Extender: Doyle Askew, FRED Weeks in Treatment: 126 History of Present Illness HPI Description: this patient has been seen a couple of times before and returns with recurrent problems to her right and left lower extremity with swelling and weeping ulcerations due to not wearing her compression stockings which she had been advised to do during her last discharge, at the end of June 2018. During her last visit the patient had had normal arterial blood flow and her venous reflux study did not necessitate any surgical intervention. She was recommended compression and elevation and wound care. After prolonged treatment the patient was completely healed but she has been noncompliant with wearing or compressions.. She was here last week with an outpatient return visit planned but the patient came in a very poor general condition with altered  mental status and was rushed to the ER on my request. With a history of hypertension, diabetes, TIA and right-sided weakness she was set up for an MRI on her brain and cervical spine and was sent to [facility]. Getting an MRI done was very difficult but once the workup was done she was found not to have any spinal stenosis, epidural abscess or hematoma or discitis. This was radiculopathy to be treated as an outpatient and she was given a follow-up appointment. Today she is feeling much better alert and oriented and has come to reevaluate her bilateral lower extremity lymphedema and ulceration 03/25/2017 -- she was admitted to the hospital on 03/16/2017 and discharged on 03/18/2017 with left leg cellulitis and ulceration. She was started on vancomycin and Zosyn and x-ray showed no bony involvement. She was treated for a cellulitis with IV antibiotics changed to Rocephin and Flagyl and was discharged on oral Keflex and doxycycline to complete a 7 day course. Last hemoglobin A1c was 7.1 and her other ailments including hypertension got asthma were appropriately treated. 05/06/2017 -- she is awaiting the right size of compression stockings from [employer] but other than that has been doing well. ====== Old notes 69 year old patient was seen one time last October and was lost to follow-up. She has recurrent problems with weeping and ulceration of her left lower extremity and has swelling of this for several years. It has been worse for the last 2 months. Past medical history is significant for diabetes mellitus type 2, hypertension, gout, morbid obesity, depressive disorders, hiatal hernia, migraines, status post knee surgery, risk of a cholecystectomy, vaginal hysterectomy and breast biopsy. She is not a smoker. As noted before she has never had a venous duplex study and an arterial ABI study was attempted but the left lower extremity was noncompressible 10/01/2016 -- had a lower extremity venous duplex  reflux evaluation which showed  no evidence of deep vein reflux in the right or left lower extremity, and no evidence of great saphenous vein reflux more than 500 ms in the right or left lower extremity, and the left small saphenous vein is incompetent but no vascular consult was recommended. review of her electronic medical records noted that the ABI was checked in July 2017 where the right ABI was normal limits and the left ABI could not be ascertained due to pain with cuff pressure but the waveforms are within normal limits. her arterial duplex study scheduled for April 27. 10/08/2016 -- the patient has various reasons for not having a compression on and for the last 3 days she has had no compression on her left lower extremity either due to pain or the lack of nursing help. She does not use her juxta lites either. 10/15/2016 -- the patient did not keep her appointment for arterial duplex study on April 27 and I have asked her to reschedule this. Her pain is out of proportion with the physical findings and she continuously fails to wear a compression wraps and cuts them off because she says she cannot tolerate the pain. She does not use her juxta lites either. 10/22/2016 -- he has rescheduled her arterial duplex study to May 21 and her pain today is a bit better. She has not been wearing her juxta lites on her right lower extremity but now understands that she needs to do this. She did tolerate the to press compression wrap on her left lower extremity 10/29/2016 --arterial duplex study is scheduled for next week and overall she has been tolerating her compression wraps and also using her juxta lites on her right lower extremity 11/05/2016 -- the right ABI was 0.95 the left was 1.03. The digit TBI is on the right was 0.83 on the left was 0.92 and she had biphasic flow through these vessels. The impression was that of normal lower extremity arterial study. 11/12/2016 -- her pain is minimal and she  is doing very well overall. 11/26/2016 -- she has got juxta lites and her insurance will not pay for additional dual layer compression stockings. She is going to order some from [place]. 05/12/2017 -- her juxta lites are very old and too big for her and these have not been helping with compression. She did get 20-30 mm compression stockings from [employer] but she and her husband are unable to put these on. I believe she will benefit from bilateral Extremit-ease, compression stockings and we will measure her for these today. 05/20/2017 -- lymphedema on the left lower extremity has increased a lot and she has a open ulceration as a result of this. The right lower extremity is looking pretty good. She has decided to by the compression stockings herself and will get reimbursed by the home health, at a later date. 05/27/2017 -- her sciatica is bothering her a lot and she thought her left leg pain was caused due to the compression wrap and hence removed it and has significant lymphedema. There is no inflammation on this left lower extremity. 06/17/17 on evaluation today patient appears to be doing very well and in fact is completely healed in regard to her ulcerations. Unfortunately however she does have continued issues with lymphedema nonetheless. We did order compression garments for her unfortunately she states that the size that she received were large although we ordered medium. Obviously this means she is not getting the optimal compression. She does not have those with her today and therefore  we could not confirm and contact the company on her behalf. Nonetheless she does state that she is going to have her husband bring them by tomorrow so that we can verify and then get in touch with the company. No fevers, chills, nausea, or vomiting noted at this time. Overall patient is doing better otherwise and I'm pleased with the progress she has made. 07/01/17 on evaluation today patient appears to be doing  very well in regard to her bilateral lower extremity she does not have any openings at this point which is excellent news. Overall I'm pleased with how things have progressed up to this time. Since she is doing so well we did order her compression which we are seeing her today to ensure that it fits her properly and everything is doing well in that regard and then subsequently she will be discharged. ============ Old Notes: 03/31/16 patient presents today for evaluation concerning open wounds that she has over the left medial ankle region as well as the left dorsal foot. She has previously had this occur although it has been healed for a number of months after having this for about a year prior until her hospitalization on 01/05/16. At that point in time it appears that she was admitted to the hospital for left lower extremity cellulitis and was placed on vancomycin and Zosyn at that point. Eventually upon discharge on January 15, 2016 she was placed on doxycycline at that point in time. Later on 03/27/16 positive wound culture growing Escherichia coli this was switched to amoxicillin. Currently she tells me that she is having pain radiated to be a 7 out of 10 which can be as high as 10 out of 10 with palpation and manipulation of the wound. This wound appears to be mainly venous in nature due to the bilateral lower extremity venous stasis/lymphedema. This is definitely much worse on her left than the right side. She does have type 1 diabetes mellitus, hypertension, morbid obesity, and is wheelchair dependent.during the course of the hospital stay a blood culture was also obtained and fortunately appeared negative. She also had an x-ray of the tibia/fibula on the left which showed no acute bone abnormality. Her white blood cell count which was performed last on 03/25/16 was 7.3, hemoglobin 12.8, protein 7.1, albumin 3.0. Her urine culture appeared to be negative for any specific organisms. Patient did  have a left lower extremity venous duplex evaluation for DVT . This did not include venous reflux studies but fortunately was negative for DVT Patient also had arterial studies performed which revealed that she had a . normal ABI on the right though this was unable to be performed on the left secondary to pain that she was having around the ankle region due to the wound. However it was stated on report that she had biphasic pulses and apparently good blood flow. ========== 06/03/17 she is here in follow-up evaluation for right lower extremity ulcer. The right lower sure he has healed but she has reopened to the left medial malleolus and dorsal foot with weeping. She is waiting for new compression garments to arrive from home health, the previous compression garments were ill fitting. We will continue with compression bilaterally and follow-up in 2 weeks Readmission: 08/05/17 on evaluation today patient appears to be doing somewhat poorly in regard to her left lower extremity especially although the right lower extremity has a small area which may no longer be open. She has been having a lot of drainage from the  left lower extremity however he tells me that she has not been able to use the EXTREMIT-EASE Compression at this point. She states that she did better and was able to actually apply the Juxta-Lite compression although the wound that she has is too large and therefore really does not compress which is why she cannot wear it at this point. She has no one who can help her put it on regular basis her son can sometimes but he's not able to do it most of the time. I do believe that's why she has begun to weave and have issues as she is currently yet again. No fevers, chills, nausea, or vomiting noted at this time. Patient is no evidence of dementia. 08/12/17 on evaluation today patient appears to still be doing fairly well in regard to the draining areas/weeping areas at this point. With that being  said she unfortunately did go to the ER yesterday due to what was felt to be possibly a cellulitis. They place her on doxycycline by mouth and discharge her home. She definitely was not admitted. With that being said she states she has had more discomfort which has been unusual for her even compared to prior times and she's had infections.08/12/17 on evaluation today patient appears to still be doing fairly well in regard to the draining areas/weeping areas at this point. With that being said she unfortunately did go to the ER yesterday due to what was felt to be possibly a cellulitis. They place her on doxycycline by mouth and discharge her home. She definitely was not admitted. With that being said she states she has had more discomfort which has been unusual for her even compared to prior times and she's had infections. 08/19/17 put evaluation today patient tells me that she's been having a lot of what sounds to be neuropathic type pain in regard to her left lower extremity. She has been using over-the-counter topical bins again which some believe. That in order to apply the she actually remove the wrap we put on her last Wednesday on Thursday. Subsequently she has not had anything on compression wise since that time. The good news is a lot of the weeping areas appear to have closed at this point again I believe she would do better with compression but we are struggling to get her to actually use what she needs to at this point. No fevers, chills, nausea, or vomiting noted at this time. 09/03/17 on evaluation today patient appears to be doing okay in regard to her lower extremities in regard to the lymphedema and weeping. Fortunately she does not seem to show any signs of infection at this point she does have a little bit of weeping occurring in the right medial malleolus area. With that being said this does not appear to be too significant which is good news. 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 She has severe skin damage secondary to both of these features involving the dorsal left foot and medial left ankle and lower leg. Still has open areas in the left anterior foot. The area on the right closed over. She uses her own juxta light stockings. She does not have an arterial issue 09/16/17 on evaluation today patient actually appears to be doing excellent in regard to her bilateral lower extremity swelling. The Juxta-Lite compression wrap seem to be doing very well for her. She has not however been using the portion that goes over her foot.  Her left foot still is draining a little bit not nearly as significant as it has been in the past but still I do believe that she likely needs to utilize the full wrap including the foot portion of this will improve as well. She also has been apparently putting on a significant amount of Vaseline which also think is not helpful for her. I recommended that if she feels she needs something for moisturizer Eucerin will probably be better. 09/30/17 on evaluation today patient presents with several new open areas in regard to her left lower extremity although these appear to be minimal and mainly seem to be more moisture breakdown than anything. Fortunately she does not seem to have any evidence of infection which is great news. She has been tolerating the dressing changes without complication we are using silver alginate on the foot she has been using AB pads to have the legs and using her Juxta- Lite compression which seems to be controlling her swelling very well. Overall I'm pleased with the poor way she has progressed. 10/14/17 on evaluation today patient appears to be doing better in regard to her left lower extremity areas of weeping. She does still have some discomfort although in general this does not appear to be as macerated and I think it is progressing nicely. I do think she still  needs to wear the foot portion of her Juxta- Lite in order to get the most benefit from the wrap obviously. She states she understands. Fortunately there does not appear to be evidence of infection at this time which is great news. 10/28/17 on evaluation today patient appears to be doing excellent in regard to her left lower extremity. She has just a couple areas that are still open and seem to be causing any trouble whatsoever. For that reason I think that she is definitely headed in the right direction the spots are very tiny compared to what we have been dealing with in the past. 11/11/17 on evaluation today patient appears to have a right lateral lower extremity ulcer that has opened since I last saw her. She states this is where the home health nurse that was coming out remove the dressing without wetting the alginate first. Nonetheless I do not know if this is indeed the case or not but more importantly we have not ordered home help to be coming out for her wounds at all. I'm unsure as to why they are coming out and we're gonna have to check on this and get things situated in that regard. With that being said we currently really do not need them to be coming out as the patient has been taking care of her leg herself without complication and no issues. In fact she was doing much better prior to nursing coming out. 11/25/17 on evaluation today patient actually appears to be doing fairly well in regard to her left lower extremity swelling. In fact she has very little area of weeping at this point there's just a small spot on the lateral portion of her right leg that still has me just a little bit more concerned as far as wanting to see this clear up before I discharge her to caring for this at home. Nonetheless overall she has made excellent progress. 12/09/17 on evaluation today patient appears to be doing rather well in regard to her lower extremity edema. She does have some weeping still in the left  lower extremity although the big area we were taking care of two  weeks ago actually has closed and she has another area of weeping on the left lower extremity immediately as well is the top of her foot. She does not currently have lymphedema pumps she has been wearing her compression daily on a regular basis as directed. With that being said I think she may benefit from lymphedema pumps. 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 We had a very difficult time getting and keeping this under control. 12/23/17 on evaluation today patient actually appears to be doing a little bit more poorly in regard to her bilateral lower extremities. She has been tolerating the Juxta-Lite compression wraps. Unfortunately she has two new ulcers on the right lower extremity and left lower Trinity ulceration seems to be larger. Obviously this is not good news. She has been tolerating the dressings without complication. 12/30/17 on evaluation today patient actually appears to be doing much better in regard to her bilateral lower extremity edema. She continues to have some issues with ulcerations and in fact there appears to be one spot on each leg where the wrap may have caused a little bit of a blister which is subsequently opened up at this point is given her pain. Fortunately it does not appear to be any evidence of infection which is good news. No fevers chills noted. 01/13/18 on evaluation today patient appears to be doing rather well in regard to her bilateral lower extremities. The dressings did get kind of stuck as far as the wound beds are concerned but again I think this is mainly due to the fact that she actually seems to be showing signs of healing which is good news. She's not having as much drainage therefore she was having more of the dressing sticking.  Nonetheless overall I feel like her swelling is dramatically down compared to previous. 01/20/18 on evaluation today patient unfortunately though she's doing better in most regards has a large blister on the left anterior lower extremity where she is draining quite significantly. Subsequently this is going to need debridement today in order to see what's underneath and ensure she does not continue to trapping fluid at this location. Nonetheless No fevers, chills, nausea, or vomiting noted at this time. 01/27/18 on evaluation today patient appears to be doing rather well at this point in regard to her right lower extremity there's just a very small area that she still has open at this point. With that being said I do believe that she is tolerating the compression wraps very well in making good progress. Home health is coming out at this point to see her. Her left lower extremity on the lateral portion is actually what still mainly open and causing her some discomfort for the most part 02/10/18 on evaluation today patient actually appears to be doing very well in regard to her right lower extremity were all the ulcers appear to be completely close. In regard to the left lower extremity she does have two areas still open and some leaking from the dorsal surface of her foot but this still seems to be doing much better to me in general. 02/24/18 on evaluation today patient actually appears to be doing much better in regard to her right lower extremity this is still completely healed. Her left lower extremity is also doing much better fortunately she has no evidence of infection. The one  area that is gonna require some debridement is still on the left anterior shin. Fortunately this is not hurting her as badly today. 03/10/18 on evaluation today patient appears to be doing better in some regards although she has a little bit more open area on the dorsal foot and she also has some issues on the medial portion of  the left lower extremity which is actually new and somewhat deep. With that being said there fortunately does not appear to be any significant signs of infection which is good news. No fevers, chills, nausea, or vomiting noted at this time. In general her swelling seems to be doing fairly well which is good news. 03/31/18 on evaluation today patient presents for follow-up concerning her left lower extremity lymphedema. Unfortunately she has been doing a little bit more poorly since I last saw her in regard to the amount of weeping that she is experiencing. She's also having some increased pain in the anterior shin location. Unfortunately I do not feel like the patient is making such good progress at this point a few weeks back she was definitely doing much better. 04/07/18 on evaluation today patient actually appears to be showing some signs of improvement as far as the left lower extremity is concerned. She has been tolerating the dressing changes and it does appear that the Drawtex did better for her. With that being said unfortunately home health is stating that they cannot obtain the Drawtex going forward. Nonetheless we're gonna have to check and see what they may be able to get the alginate they were using was getting stuck in causing new areas of skin being pulled all that with and subsequently weep and calls her to worsen overall this is the first time we've seen improvement at this time. 04/14/18 on evaluation today patient actually appears to be doing rather well at this point there does not appear to be any evidence of infection at this time and she is actually doing excellent in regard to the weeping in fact she almost has no openings remaining even compared to just last week this is a dramatic improvement. No fevers chills noted 04/21/18 evaluation today patient actually appears to be doing very well. She in fact is has a small area on the posterior lower extremity location and she has  a small area on the dorsal surface of her foot that are still open both of which are very close to closing. We're hoping this will be close shortly. She brought her Juxta-Lite wrap with her today hoping that would be able to put her in it unfortunately I don't think were quite at that point yet but we're getting closer. 04/28/18 upon evaluation today patient actually appears to be doing excellent in regard to her left lower extremity ulcer. In fact the region on the posterior lower extremity actually is much smaller than previously noted. Overall I'm very happy with the progress she has made. She again did bring her Juxta-Lite although we're not quite ready for that yet. 05/11/18 upon evaluation today patient actually appears to be doing in general fairly well in regard to her left lower Trinity. The swelling is very well controlled. With that being said she has a new area on the left anterior lower extremity as well as between the first and second toes of her left foot that was not present during the last evaluation. The region of her posterior left lower extremity actually appears to be almost completely healed. T be honest I'm very pleased  with o the way that stands. Nonetheless I do believe that the lotion may be keeping the area to moist as far as her legs are concerned subsequently I'm gonna consider discontinuing that today. 05/26/18 on evaluation today patient appears to be doing rather well in regard to her left lower should be ulcers. In fact everything appears to be close except for a very small area on the left posterior lower extremity. Fortunately there does not appear to be any evidence of infection at this time. Overall very pleased with her progress. 06/02/18 and evaluation today patient actually appears to be doing very well in regard to her lower extremity ulcers. She has one small area that still continues to weep that I think may benefit her being able to justify lotion and user  Juxta-Lite wraps versus continued to wrap her. Nonetheless I think this is something we can definitely look into at this point. 06/23/18 on evaluation today patient unfortunately has openings of her bilateral lower extremities. In general she seems to be doing much worse than when I last saw her just as far as her overall health standpoint is concerned. She states that her discomfort is mainly due to neuropathy she's not having any other issues otherwise. No fevers, chills, nausea, or vomiting noted at this time. 06/30/18 on evaluation today patient actually appears to be doing a little worse in regard to her right lower extremity her left lower extremity of doing fairly well. Fortunately there is no sign of infection at this time. She has been tolerating the dressing changes without complication. Home health did not come out like they were supposed to for the appropriate wrap changes. They stated that they never received the orders from [place] which were fax. Nonetheless we will send a copy of the orders with the patient today as well. 07/07/18 on evaluation today patient appears to be doing much better in regard to lower extremities. She still has several openings bilaterally although since I last saw her her legs did show obvious signs of infection when she later saw her nurse. Subsequently a culture was obtained and she is been placed on Bactrim and Keflex. Fortunately things seem to be looking much better it does appear she likely had an infection. Again last week we'd even discussed it but again there really was not any obvious sign that she had infection therefore we held off on the antibiotics. Nonetheless I'm glad she's doing better today. 07/14/18 on evaluation today patient appears to be doing much better regarding her bilateral lower Trinity's. In fact on the right lower for me there's nothing open at this point there are some dry skin areas at the sites where she had infection. Fortunately there is  no evidence of systemic infection which is excellent news. No fevers chills noted 07/21/18 on evaluation today patient actually appears to be doing much better in regard to her left lower extremity ulcers. She is making good progress and overall I feel like she's improving each time I see her. She's having no pain I do feel like the infection is completely resolved which is excellent news. No fevers, chills, nausea, or vomiting noted at this time. 07/28/18 on evaluation today patient appears to be doing very well in regard to her left lower [employer]. Everything seems to be showing signs of improvement which is excellent news. Overall very pleased with the progress that has been made. Fortunately there's no evidence of active infection at this time also excellent news. 08/04/18 on evaluation  today patient appears to be doing more poorly in regard to her bilateral lower extremities. She has two new areas open up on the right and these were completely closed as of last week. She still has the two spots on the left which in my pinion seem to be doing better. Fortunately there's no evidence of infection again at this point. 08/11/18 on evaluation today patient actually appears to be doing very well in regard to her bilateral lower Trinity wounds that all seem to be doing better and are measures smaller today. Fortunately there's no signs of infection. No fevers, chills, nausea, or vomiting noted at this time. 08/18/18 on evaluation today patient actually appears to be doing about the same inverter bilateral lower extremities. She continues to have areas that blistering open as was drain that fortunately nothing too significant. Overall I feel like Drawtex may have done better for her however compared to the collagen. 08/25/18 on evaluation today patient appears to be doing a little bit more poorly today even compared to last time I saw her. Again I'm not exactly sure why she's making worse progress over  the past several weeks. I'm beginning to wonder if there is some kind of underlying low level infection causing this issue. I did actually take a culture from the left anterior lower extremity but it was a new wound draining quite a bit at this point. Unfortunately she also seems to be having more pain which is what also makes me worried about the possibility of infection. This is despite never erythema noted at this point. 09/01/18 on evaluation today patient actually appears to be doing a little worse even compared to last week in regard to bilateral lower extremities. She did go to the hospital on the 16th was given a dose of IV Zosyn and then discharged with a recommendation to continue with the Bactrim that I previously prescribed for her. Nonetheless she is still having a lot of discomfort she tells me as well at this time. This is definitely unfortunate. No fevers, chills, nausea, or vomiting noted at this time. 09/08/18 on evaluation today patient's bilateral lower extremities actually appear to be shown signs of improvement which is good news. Fortunately there does not appear to be any signs of active infection I think the anabiotic is helping in this regard. Overall I'm very pleased with how she is progressing. 09/15/18 patient was actually seen in ER yesterday due to her legs as well unfortunately. She states she's been having a lot of pain and discomfort as well as a lot of drainage. Upon inspection today the patient does have a lot of swelling and drainage I feel like this is more related to lymphedema and poor fluid control than it is to infection based on what I'm seeing. The physician in the emergency department also doubted that the patient was having a significant infection nonetheless I see no evidence of infection obvious at this point although I do see evidence of poor fluid control. She still not using a compression pumps, she is not elevating due to her lift chair as well as her  hospital bed being broken, and she really is not keeping her legs up as much as they should be and also has been taking off her wraps. All this combined I think has led to poor fluid control and to be honest she may be somewhat volume overloaded in general as well. I recommend that she may need to contact your physician to see if a  prescription for a diuretic would be beneficial in their opinion. As long as this is safe I think it would likely help her. 09/29/18 on evaluation today patient's left lower extremity actually appears to be doing quite a bit better. At least compared to last time that I saw her. She still has a large area where she is draining from but there's a lot of new skin speckled trout and in fact there's more new skin that there are open areas of weeping and drainage at this point. This is good news. With regard to the right lower extremity this is doing much better with the only open area that I really see being a dry spot on the right lateral ankle currently. Fortunately there's no signs of active infection at this time which is good news. No fevers, chills, nausea, or vomiting noted at this time. The patient seems somewhat stressed and overwhelmed during the visit today she was very lethargic as such. She does and she is not taking any pain medications at this point. Apparently according to her husband are also in the process of moving which is probably taking its toll on her as well. 10/06/18 on evaluation today patient appears to be doing rather well in regard to her lower extremities compared to last evaluation. Fortunately there's no signs of active infection. She tells me she did have an appointment with her primary. Nonetheless he was concerned that the wounds were somewhat deep based on pictures but we never actually saw her legs. She states that he had her somewhat worried due to the fact that she was fearing now that she was [place] have to have an amputation. With that being  said based on what I'm seeing check she looks better this week that she has the last two times I've seen her with much less drainage I'm actually pleased in this regard. That doesn't mean that she's out of the water but again I do not think what the point of talking about education at all in regard to her leg. She is very happy to hear this. She is also not having as much pain as she was having last week. 10/13/18 unfortunately on evaluation today patient still continues to have a significant amount of drainage she's not letting home health actually apply the compression dressings at this point. She's trying to use of Juxta-Lite of the top of Kerlex and the second layer of the three layer compression wrap. With that being said she just does not seem to be making as good a progress as I would expect if she was having the compression applied and in place on a regular basis. No fevers, chills, nausea, or vomiting noted at this time. 10/20/18 on evaluation today patient appears to be doing a little better in regard to her bilateral lower extremity ulcers. In fact the right lower extremity seems to be healed she doesn't even have any openings at this point left lower extremity though still somewhat macerated seems to be showing signs of new skin growth at multiple locations throughout. Fortunately there's no evidence of active infection at this time. No fevers, chills, nausea, or vomiting noted at this time. 10/27/18 on evaluation today patient appears to be doing much better in regard to her left lower Trinity ulcer. She's been tolerating the laptop complication and has minimal drainage noted at this point. Fortunately there's no signs of active infection at this time. No fevers, chills, nausea, or vomiting noted at this time. 11/03/18 on evaluation today  patient actually appears to be doing excellent in regard to her left lower extremity. She is having very little drainage at this point there does not appear  to be any significant signs of infection overall very pleased with how things have gone. She is likewise extremely pleased still and seems to be making wonderful progress week to week. I do believe antibiotics were helpful for her. Her primary care provider did place on amateur clean since I last saw her. 11/17/18 on evaluation today patient appears to be doing worse in regard to her bilateral lower extremities at this point. She is been tolerating the dressing changes without complication. With that being said she typically takes the Coban off fairly quickly upon arriving home even after being seen here in the clinic and does not allow home health reapply command as part of the dressing at home. Therefore she said no compression essentially since I last saw her as best I can tell. With that being said I think it shows and how much swelling she has in the open wounds that are noted at this point. Fortunately there's no signs of infection but unfortunately if she doesn't get this under control I think she will end up with infection and more significant issues. 11/24/18 on evaluation today patient actually appears to be doing somewhat better in regard to her bilateral lower extremities. 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 Nonetheless that is doing better currently so she should be able to attempt reinitiating the lymphedema pumps at this time. No fevers, chills, nausea, or vomiting noted at this time. 12/01/18 upon evaluation today patient's left lower extremity appears to be doing quite well unfortunately her right lower extremity is not doing nearly as well. She has been tolerating the dressing changes without complication unfortunately she did not keep a wrap on the right at this time. Nonetheless I believe this has led to increased swelling and weeping in  the world is actually much larger than during the last evaluation with her. 12/08/18 on evaluation today patient appears to be doing about the same at this point in regard to her right lower extremity. There is some more palatable to touch I'm concerned about the possibility of there being some infection although I think the main issue is she's not keeping her compression wrap on which in turn is not allowing this area to heal appropriately. 12/22/18 on evaluation today patient appears to be doing better in regard to left lower extremity unfortunately significantly worse in regard to the right lower extremity. The areas of blistering and necrotic superficial tissue have spread and again this does not really appear to be signs of infection and all she just doesn't seem to be doing nearly as well is what she has been in the past. Overall I feel like the Augmentin did absolutely nothing for her she doesn't seem to have any infection again I really didn't think so last time either is more of a potential preventative measure and hoping that this would make some difference but I think the main issue is she's not wearing her compression. She tells me she cannot wear the [place] been we put on she takes it off pretty much upon getting home. Subsequently she worshiped Juxta-Lite when I questioned her about how often she wears it this is no  more than three hours a day obviously that leaves 21 hours that she has no compression and this is obviously not doing well for her. Overall I'm concerned that if things continue to worsen she is at great risk of both infection as well as losing her leg. 01/05/19 on evaluation today patient appears to be doing well in regard to her left lower extremity which he is allowing [place] to wrap and not so well with regard to her right lower extremity which she is not allowing [place] to really wrap and keep the wrap on. She states that it hurts too badly whenever it's wrapped and she ends up  having to take it off. She's been using the Juxta-Lite she tells me up to six hours a day although I question whether or not that's really been the case to be honest. Previously she told me three hours today nonetheless obviously the legs as long as the wrap is doing great when she is not is doing much more poorly. 01/12/2019 on evaluation today patient actually appears to be doing a little better in my opinion with regard to her right lower extremity ulcer. She has a small open area on the left lower extremity unfortunately but again this I think is part of the normal fluctuation of what she is going to have to expect with regard to her legs especially when she is not using her lymphedema pumps on a regular basis. Subsequently based on what I am seeing today I think that she does seem to be doing slightly better with regard to her right lower extremity she did see her primary care provider on Monday they felt she had an infection and placed her on 2 antibiotics. Both Cipro and clindamycin. Subsequently again she seems possibly to be doing a little bit better in regards to the right lower extremity she also tells me however she has been wearing the compression wrap over the past week since I spoke with her as well that is a Kerlix and Coban wrap on the right. No fevers, chills, nausea, vomiting, or diarrhea. 01/19/2019 on evaluation today patient appears to be doing better with regard to her bilateral lower extremities especially the right. I feel like the compression has been beneficial for her which is great news. She did get a call from her primary care provider on her way here today telling her that she did have methicillin- resistant Staphylococcus aureus and he was calling in a couple new antibiotics for her including a ointment to be applied she tells me 3 times a day. With that being said this sounds like likely to be Bactroban which I think could be applied with each dressing/wrap change but I  would not be able to accommodate her applying this 3 times a day. She is in agreement with the least doing this we will add that to her orders today. 01/26/2019 on evaluation today patient actually appears to be doing much better with regard to her right lower extremity. Her left lower extremity is also doing quite well all things considering. Fortunately there is no evidence of active infection at this time. No fevers, chills, nausea, vomiting, or diarrhea. 02/02/2019 on evaluation today patient appears to be doing much better compared to her last evaluation. Little by little off like her right leg is returning more towards normal. There does not appear to be any signs of active infection and overall she seems to be doing quite well which is great news. I am very pleased  in this regard. No fevers, chills, nausea, vomiting, or diarrhea. 02/09/2019 upon evaluation today patient appears to be doing better with regard to her bilateral lower extremities. She has been tolerating the dressing changes without complication. Fortunately there is no signs of active infection at this time. No fevers, chills, nausea, vomiting, or diarrhea. 02/23/2019 on evaluation today patient actually appears to be doing quite well with regard to her bilateral lower extremities. She has been tolerating the dressing changes without complication. She is even used her pumps one time and states that she really felt like it felt good. With that being said she seems to be in good spirits and her legs appear to be doing excellent. 03/09/2019 on evaluation today patient appears to be doing well with regard to her right lower extremity there are no open wounds at this time she is having some discomfort but I feel like this is more neuropathy than anything. With regard to her left lower extremity she had several areas scattered around that she does have some weeping and drainage from but again overall she does not appear to be having any  significant issues and no evidence of infection at this time which is good news. 03/23/2019 on evaluation today patient appears to be doing well with regard to her right lower extremity which she tells me is still close she is using her juxta light here. Her left lower extremity she mainly just has an area on the foot which is still slightly draining although this also is doing great. Overall very pleased at this time. 04/06/2019 patient appears to be doing a little bit worse in regard to her left lower extremity upon evaluation today. She feels like this could be becoming infected again which she had issues with previous. Fortunately there is no signs of systemic infection but again this is always a struggle with her with her legs she will go from doing well to not so well in a very short amount of time. 04/20/2019 on evaluation today patient actually appears to be doing quite well with regard to her right lower extremity I do not see any signs of active infection at this time. Fortunately there is no fever chills noted. She is still taking the antibiotics which I prescribed for her at this point. In regard to the left lower extremity I do feel like some of these areas are better although again she still is having weeping from several locations at this time. 04/27/2019 on evaluation today patient appears to be doing about the same if not slightly worse in regard to her left lower extremity ulcers. She tells me when questioned that she has been sleeping in her Hoveround chair in fact she tells me she falls asleep without even knowing it. I think she is spending a whole lot of time in the chair and less time walking and moving around which is not good for her legs either. On top of that she is in a seated position which is also the worst position she is not really elevating her legs and she is also not using her lymphedema pumps. All this is good to contribute to worsening of her condition in  general. 05/18/2019 on evaluation today patient appears to be doing well with regard to her lower extremity on the right in fact this is showing no signs of any open wounds at this time. On the left she is continuing to have issues with areas that do drain. Some of the regions have healed and  there are couple areas that have reopened. She did go to the ER per the patient according to recommendations from the home health nurse due to what she was seen when she came out on 05/13/2019. Subsequently she felt like the patient needed to go to the hospital due to the fact that again she was having "milky white discharge" from her leg. Nonetheless she had and then was placed on doxycycline and subsequently seems to be doing better. 06/01/2019 upon evaluation today patient appears to be doing really in my opinion about the same. I do not see any signs of active infection which is good news. Overall she still has wounds over the bilateral lower extremities she has reopened on the right but this appears to be more of a crack where there is weeping/edema coming from the region. I do not see any evidence of infection at either site based on what I visualized today. 07/13/2019 upon evaluation today patient appears to be doing a little worse compared to last time I saw her. She since has been in the hospital from 06/21/2019 through 06/29/2019. This was secondary to having Covid. During that time they did apply lotion to her legs which unfortunately has caused her to develop a myriad of open wounds on her lower extremities. Her legs do appear to be doing better as far as the overall appearance is concerned but nonetheless she does have more open and weeping areas. 07/27/2019 upon evaluation today patient appears to be doing more poorly to be honest in regard to her left lower extremity in particular. There is no signs of systemic infection although I do believe she may have local infection. She notes she has been having  a lot of blue/green drainage which is consistent potentially with Pseudomonas. That may be something that we need to consider here as well. The doxycycline does not seem to have been helping. 08/03/2019 upon evaluation today patient appears to be doing a little better in my opinion compared to last week's evaluation. Her culture I did review today and she is on appropriate medications to help treat the Enterobacter that was noted. Overall I feel like that is good news. With that being said she is unfortunately continuing to have a lot of drainage and though it is doing better I still think she has a long ways to go to get things dried up in general. Fortunately there is no signs of systemic infection. 08/10/2019 upon evaluation today patient appears to be doing may be slightly better in regard to her left lower extremity the right lower extremity is doing much better. Fortunately there is no signs of infection right now which is good news. No fevers, chills, nausea, vomiting, or diarrhea. 08/24/2019 on evaluation today patient appears to be doing slightly better in regard to her lower extremities. The left lower extremity seems to be healed the right lower extremity is doing better though not completely healed as far as the openings are concerned. She has some generalized issues here with edema and weeping secondary to her lymphedema though again I do believe this is little bit drier compared to prior weeks evaluations. In general I am very pleased with how things seem to be progressing. No fevers, chills, nausea, vomiting, or diarrhea. 08/31/2019 upon evaluation today patient actually seems to making some progress here with regard to the left lower extremity in particular. She has been tolerating the dressing changes without complication. Fortunately there is no signs of active infection at this time. No fevers,  chills, nausea, vomiting, or diarrhea. She did see Dr. Doren Custard and he did note that she did have  a issue with the left great saphenous vein and the small saphenous vein in the leg. With that being said he was concerned about the possibility of laser ablation not being extremely successful. He also mentioned a small risk of DVT associated with the procedure. However if the wounds do not continue to improve he stated that that would probably be the way to go. Fortunately the patient's legs do seem to be doing much better. 09/07/2019 upon evaluation today patient appears to be doing better with regard to her lower extremities. She has been tolerating the dressing changes without complication. With that being said she is showing signs of improvement and overall very pleased. There are some areas on her leg that I think we do need to debride we discussed this last week the patient is in agreement with doing that as long as it does not hurt too badly. 09/14/2019 upon evaluation today patient appears to be doing decently well with regard to her left lower extremity. She is not having near as much weeping as she has had in the past things seem to be drying up which is good news. There is no signs of active infection at this time. 09/21/19 upon evaluation today patient appears to be doing better in regard overall to her bilateral lower extremities. She again has less open than she did previous and each week I feel like this is getting better. Fortunately there is no signs of active infection at this time. No fevers, chills, nausea, vomiting, or diarrhea. 09/28/2019 upon evaluation today patient actually appears to be showing signs of improvement with regard to her left lower extremity. Unfortunately the right medial lower extremity around the ankle region has reopened to some degree but this appears to be minimal still which is good news. There is no signs of active infection at this time which is also good news. 10/12/2019 upon evaluation today patient appears to be doing okay with regard to her bilateral  lower extremities today. The right is a little bit worse then last evaluation 2 weeks ago. The left is actually doing a little better in my opinion. Overall there is no signs of active infection at this time that I see. Obviously that something we have to keep a close eye on she is very prone to this with the significant and multiple openings that she has over the bilateral lower extremities. 10/19/2019 upon evaluation today patient appears to be doing about the best that I have seen her in quite some time. She has been tolerating the dressing changes without complication. There does not appear to be any signs of active infection and overall I am extremely happy with the way her legs appeared. She is drying up quite nicely and overall is having less pain. 11/09/2019 upon evaluation today patient appears to be doing better in regard to her wounds. She seems to be drying up more and more each time I see her this is just taking a very long time. Fortunately there is no signs of active infection at this time. 11/23/2019 upon evaluation today patient actually appears to be doing excellent in regard to her lower extremities at this point compared to where she has been. Fortunately there is no signs of active infection at this time. She did go to the hospital last week for nausea and vomiting completely unrelated to her wounds. Fortunately she is doing better  she was given some Reglan and got better. She had associated abdominal pain but they never found out what was going on. 12/07/2019 upon evaluation today patient appears to be doing well for the most part in regard to her legs. She unfortunately has not been keeping the Coban portion of her wraps on therefore the compression has not really been sufficient for what it is supposed to be. Nonetheless she tells me that it just hurt too bad therefore she removed it. 12/21/2019 upon evaluation today patient actually appears to be doing quite well with regard to her  legs. I do feel like she has been making progress which is great news and overall there is no signs of active infection at this time. No fevers, chills, nausea, vomiting, or diarrhea. 01/04/2020 upon evaluation today patient presents for follow-up concerning her lower extremity edema bilaterally. She still has open wounds she has not been using her lymphedema pumps. She is also not been utilizing her compression wraps appropriately she tends to unwrap them, take them off, or states that they hurt. Obviously the reason they hurt is because her legs start to swell but the issue is if she would use her compression/lymphedema pumps regularly she would not swell and she would have the pain. Nonetheless she has not even picked them up once honestly over the past several months and may be even as much as in the past year based on my opinion and what have seen. She tells me today that after last week when I talked about this with her specifically actually that was 2 weeks ago that she "forgot". Electronic Signature(s) Signed: 01/04/2020 11:01:14 AM By: [name] PA-C Entered By: [name] on 01/04/2020 11:01:14 -------------------------------------------------------------------------------- Physical Exam Details Patient Name: Date of Service: TEDI, HUGHSON 01/04/2020 10:00 A M Medical Record Number: [id number] Patient Account Number: [id number] Date of Birth/Sex: Treating RN: [birth year] (69 y.o. [name] Primary Care Provider: [name] Other Clinician: Referring Provider: Treating Provider/Extender: Doyle Askew, FRED Weeks in Treatment: 126 Constitutional Well-nourished and well-hydrated in no acute distress. Respiratory normal breathing without difficulty. Psychiatric this patient is able to make decisions and demonstrates good insight into disease process. Alert and Oriented x 3. pleasant and cooperative. Notes Patient's wounds currently are showing signs of  minimal improvement there is some improvement but again this is slight she still has a lot of weeping and a lot of maceration in general. Electronic Signature(s) Signed: 01/04/2020 11:01:29 AM By: [name] PA-C Entered By: [name] on 01/04/2020 11:01:29 -------------------------------------------------------------------------------- Physician Orders Details Patient Name: Date of Service: [name]. 01/04/2020 10:00 A M Medical Record Number: [id number] Patient Account Number: [id number] Date of Birth/Sex: Treating RN: 03-Sep-1950 (69 y.o. [name] Primary Care Provider: [name] Other Clinician: Referring Provider: Treating Provider/Extender: Doyle Askew, FRED Weeks in Treatment: [phone] Verbal / Phone Orders: No Diagnosis Coding ICD-10 Coding Code Description E11.622 Type 2 diabetes mellitus with other skin ulcer I89.0 Lymphedema, not elsewhere classified I87.331 Chronic venous hypertension (idiopathic) with ulcer and inflammation of right lower extremity I87.332 Chronic venous hypertension (idiopathic) with ulcer and inflammation of left lower extremity L97.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 leg with fat layer exposed L97.822 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 I10 Essential (primary) hypertension E66.01 Morbid (severe) obesity due to excess  calories F41.8 Other specified anxiety disorders R53.1 Weakness Follow-up Appointments Return Appointment in 2 weeks. Dressing Change Frequency Wound #61 Left,Circumferential Lower Leg Change dressing three times week. Wound #64 Left,Dorsal Foot Change dressing three times week. Wound #65 Right,Medial Lower Leg Change dressing three times week. Skin Barriers/Peri-Wound Care Barrier cream - zinc oxide cream to any macerated areas Moisturizing  lotion - to dry skin Wound Cleansing Clean wound with Wound Cleanser - all wounds, wash legs with soap and water with dressing changes May shower with protection. Primary Wound Dressing Wound #61 Left,Circumferential Lower Leg Calcium Alginate with Silver Wound #64 Left,Dorsal Foot Calcium Alginate with Silver Wound #65 Right,Medial Lower Leg Calcium Alginate with Silver Secondary Dressing Wound #61 Left,Circumferential Lower Leg Dry Gauze ABD pad - as needed Wound #64 Left,Dorsal Foot Dry Gauze ABD pad - as needed Wound #65 Right,Medial Lower Leg Dry Gauze Edema Control 3 Layer Compression System - Bilateral void standing for long periods of time - walking is encouraged A Elevate legs to the level of the heart or above for 30 minutes daily and/or when sitting, a frequency of: - do not sleep in chair with feet dangling, MUST elevate legs while sitting Exercise regularly Segmental Compressive Device. - lymphedema pumps 60 minutes 1- 2 times per day Off-Loading Turn and reposition every 2 hours Additional Orders / Instructions Follow Nutritious Diet - T include vitamin A, vitamin C, and Zinc along with increased protein intake. o Home Health Continue Home Health skilled nursing for wound care. - [facility] Electronic Signature(s) Signed: 01/04/2020 6:54:26 PM By: Lenda Kelp PA-C Signed: 01/05/2020 4:56:58 PM By: Zenaida Deed RN, BSN Entered By: Zenaida Deed on 01/04/2020 10:56:03 -------------------------------------------------------------------------------- Problem List Details Patient Name: Date of Service: [name]. 01/04/2020 10:00 A M Medical Record Number: [id number] Patient Account Number: [id number] Date of Birth/Sex: Treating RN: 07-02-1950 (69 y.o. [name] Primary Care Provider: Fatima Sanger Other Clinician: Referring Provider: Treating Provider/Extender: Massie Bougie, FRED Weeks in Treatment: [phone] Active  Problems ICD-10 Encounter Code Description Active Date MDM Diagnosis E11.622 Type 2 diabetes mellitus with other skin ulcer 08/05/2017 No Yes I89.0 Lymphedema, not elsewhere classified 08/05/2017 No Yes I87.331 Chronic venous hypertension (idiopathic) with ulcer and inflammation of right 08/05/2017 No Yes lower extremity I87.332 Chronic venous hypertension (idiopathic) with ulcer and inflammation of left 08/05/2017 No Yes lower extremity L97.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 leg with fat layer 08/05/2017 No Yes exposed L97.822 Non-pressure chronic ulcer of other part of left lower leg with fat layer exposed2/20/2019 No Yes L97.522 Non-pressure chronic ulcer of other part of left foot with fat layer exposed 06/01/2019 No Yes I10 Essential (primary) hypertension 08/05/2017 No Yes E66.01 Morbid (severe) obesity due to excess calories 08/05/2017 No Yes F41.8 Other specified anxiety disorders 08/05/2017 No Yes R53.1 Weakness 08/05/2017 No Yes Inactive Problems Resolved Problems Electronic Signature(s) Signed: 01/04/2020 10:12:22 AM By: Lenda Kelp PA-C Entered By: Lenda Kelp on 01/04/2020 10:12:21 -------------------------------------------------------------------------------- Progress Note Details Patient Name: Date of Service: [name]. 01/04/2020 10:00 A M Medical Record Number: [id number] Patient Account Number: [id number] Date of Birth/Sex: Treating RN: [birth year] (69 y.o. [name] Primary Care Provider: Fatima Sanger Other Clinician: Referring Provider: Treating Provider/Extender: Massie Bougie, FRED Weeks in Treatment: 126 Subjective Chief Complaint Information obtained from Patient Bilateral reoccurring LE ulcers History of Present Illness (HPI) this patient has been seen a couple of times before and returns with recurrent problems to her  right and left lower extremity with swelling and weeping ulcerations due to not  wearing her compression stockings which she had been advised to do during her last discharge, at the end of June 2018. During her last visit the patient had had normal arterial blood flow and her venous reflux study did not necessitate any surgical intervention. She was recommended compression and elevation and wound care. After prolonged treatment the patient was completely healed but she has been noncompliant with wearing or compressions.. She was here last week with an outpatient return visit planned but the patient came in a very poor general condition with altered mental status and was rushed to the ER on my request. With a history of hypertension, diabetes, TIA and right-sided weakness she was set up for an MRI on her brain and cervical spine and was sent to [facility]. Getting an MRI done was very difficult but once the workup was done she was found not to have any spinal stenosis, epidural abscess or hematoma or discitis. This was radiculopathy to be treated as an outpatient and she was given a follow-up appointment. Today she is feeling much better alert and oriented and has come to reevaluate her bilateral lower extremity lymphedema and ulceration 03/25/2017 -- she was admitted to the hospital on 03/16/2017 and discharged on 03/18/2017 with left leg cellulitis and ulceration. She was started on vancomycin and Zosyn and x-ray showed no bony involvement. She was treated for a cellulitis with IV antibiotics changed to Rocephin and Flagyl and was discharged on oral Keflex and doxycycline to complete a 7 day course. Last hemoglobin A1c was 7.1 and her other ailments including hypertension got asthma were appropriately treated. 05/06/2017 -- she is awaiting the right size of compression stockings from [place] but other than that has been doing well. ====== Old notes 69 year old patient was seen one time last October and was lost to follow-up. She has recurrent problems with weeping and  ulceration of her left lower extremity and has swelling of this for several years. It has been worse for the last 2 months. Past medical history is significant for diabetes mellitus type 2, hypertension, gout, morbid obesity, depressive disorders, hiatal hernia, migraines, status post knee surgery, risk of a cholecystectomy, vaginal hysterectomy and breast biopsy. She is not a smoker. As noted before she has never had a venous duplex study and an arterial ABI study was attempted but the left lower extremity was noncompressible 10/01/2016 -- had a lower extremity venous duplex reflux evaluation which showed no evidence of deep vein reflux in the right or left lower extremity, and no evidence of great saphenous vein reflux more than 500 ms in the right or left lower extremity, and the left small saphenous vein is incompetent but no vascular consult was recommended. review of her electronic medical records noted that the ABI was checked in July 2017 where the right ABI was normal limits and the left ABI could not be ascertained due to pain with cuff pressure but the waveforms are within normal limits. her arterial duplex study scheduled for April 27. 10/08/2016 -- the patient has various reasons for not having a compression on and for the last 3 days she has had no compression on her left lower extremity either due to pain or the lack of nursing help. She does not use her juxta lites either. 10/15/2016 -- the patient did not keep her appointment for arterial duplex study on April 27 and I have asked her to reschedule this. Her  pain is out of proportion with the physical findings and she continuously fails to wear a compression wraps and cuts them off because she says she cannot tolerate the pain. She does not use her juxta lites either. 10/22/2016 -- he has rescheduled her arterial duplex study to May 21 and her pain today is a bit better. She has not been wearing her juxta lites on her right lower  extremity but now understands that she needs to do this. She did tolerate the to press compression wrap on her left lower extremity 10/29/2016 --arterial duplex study is scheduled for next week and overall she has been tolerating her compression wraps and also using her juxta lites on her right lower extremity 11/05/2016 -- the right ABI was 0.95 the left was 1.03. The digit TBI is on the right was 0.83 on the left was 0.92 and she had biphasic flow through these vessels. The impression was that of normal lower extremity arterial study. 11/12/2016 -- her pain is minimal and she is doing very well overall. 11/26/2016 -- she has got juxta lites and her insurance will not pay for additional dual layer compression stockings. She is going to order some from [place]. 05/12/2017 -- her juxta lites are very old and too big for her and these have not been helping with compression. She did get 20-30 mm compression stockings from [place] but she and her husband are unable to put these on. I believe she will benefit from bilateral Extremit-ease, compression stockings and we will measure her for these today. 05/20/2017 -- lymphedema on the left lower extremity has increased a lot and she has a open ulceration as a result of this. The right lower extremity is looking pretty good. She has decided to by the compression stockings herself and will get reimbursed by the home health, at a later date. 05/27/2017 -- her sciatica is bothering her a lot and she thought her left leg pain was caused due to the compression wrap and hence removed it and has significant lymphedema. There is no inflammation on this left lower extremity. 06/17/17 on evaluation today patient appears to be doing very well and in fact is completely healed in regard to her ulcerations. Unfortunately however she does have continued issues with lymphedema nonetheless. We did order compression garments for her unfortunately she states that the size that  she received were large although we ordered medium. Obviously this means she is not getting the optimal compression. She does not have those with her today and therefore we could not confirm and contact the company on her behalf. Nonetheless she does state that she is going to have her husband bring them by tomorrow so that we can verify and then get in touch with the company. No fevers, chills, nausea, or vomiting noted at this time. Overall patient is doing better otherwise and I'm pleased with the progress she has made. 07/01/17 on evaluation today patient appears to be doing very well in regard to her bilateral lower extremity she does not have any openings at this point which is excellent news. Overall I'm pleased with how things have progressed up to this time. Since she is doing so well we did order her compression which we are seeing her today to ensure that it fits her properly and everything is doing well in that regard and then subsequently she will be discharged. ============ Old Notes: 03/31/16 patient presents today for evaluation concerning open wounds that she has over the left medial ankle region  as well as the left dorsal foot. She has previously had this occur although it has been healed for a number of months after having this for about a year prior until her hospitalization on 01/05/16. At that point in time it appears that she was admitted to the hospital for left lower extremity cellulitis and was placed on vancomycin and Zosyn at that point. Eventually upon discharge on January 15, 2016 she was placed on doxycycline at that point in time. Later on 03/27/16 positive wound culture growing Escherichia coli this was switched to amoxicillin. Currently she tells me that she is having pain radiated to be a 7 out of 10 which can be as high as 10 out of 10 with palpation and manipulation of the wound. This wound appears to be mainly venous in nature due to the bilateral lower extremity  venous stasis/lymphedema. This is definitely much worse on her left than the right side. She does have type 1 diabetes mellitus, hypertension, morbid obesity, and is wheelchair dependent.during the course of the hospital stay a blood culture was also obtained and fortunately appeared negative. She also had an x-ray of the tibia/fibula on the left which showed no acute bone abnormality. Her white blood cell count which was performed last on 03/25/16 was 7.3, hemoglobin 12.8, protein 7.1, albumin 3.0. Her urine culture appeared to be negative for any specific organisms. Patient did have a left lower extremity venous duplex evaluation for DVT . This did not include venous reflux studies but fortunately was negative for DVT Patient also had arterial studies performed which revealed that she had a . normal ABI on the right though this was unable to be performed on the left secondary to pain that she was having around the ankle region due to the wound. However it was stated on report that she had biphasic pulses and apparently good blood flow. ========== 06/03/17 she is here in follow-up evaluation for right lower extremity ulcer. The right lower sure he has healed but she has reopened to the left medial malleolus and dorsal foot with weeping. She is waiting for new compression garments to arrive from home health, the previous compression garments were ill fitting. We will continue with compression bilaterally and follow-up in 2 weeks Readmission: 08/05/17 on evaluation today patient appears to be doing somewhat poorly in regard to her left lower extremity especially although the right lower extremity has a small area which may no longer be open. She has been having a lot of drainage from the left lower extremity however he tells me that she has not been able to use the EXTREMIT-EASE Compression at this point. She states that she did better and was able to actually apply the Juxta-Lite compression although  the wound that she has is too large and therefore really does not compress which is why she cannot wear it at this point. She has no one who can help her put it on regular basis her son can sometimes but he's not able to do it most of the time. I do believe that's why she has begun to weave and have issues as she is currently yet again. No fevers, chills, nausea, or vomiting noted at this time. Patient is no evidence of dementia. 08/12/17 on evaluation today patient appears to still be doing fairly well in regard to the draining areas/weeping areas at this point. With that being said she unfortunately did go to the ER yesterday due to what was felt to be possibly a cellulitis.  They place her on doxycycline by mouth and discharge her home. She definitely was not admitted. With that being said she states she has had more discomfort which has been unusual for her even compared to prior times and she's had infections.08/12/17 on evaluation today patient appears to still be doing fairly well in regard to the draining areas/weeping areas at this point. With that being said she unfortunately did go to the ER yesterday due to what was felt to be possibly a cellulitis. They place her on doxycycline by mouth and discharge her home. She definitely was not admitted. With that being said she states she has had more discomfort which has been unusual for her even compared to prior times and she's had infections. 08/19/17 put evaluation today patient tells me that she's been having a lot of what sounds to be neuropathic type pain in regard to her left lower extremity. She has been using over-the-counter topical bins again which some believe. That in order to apply the she actually remove the wrap we put on her last Wednesday on Thursday. Subsequently she has not had anything on compression wise since that time. The good news is a lot of the weeping areas appear to have closed at this point again I believe she would do  better with compression but we are struggling to get her to actually use what she needs to at this point. No fevers, chills, nausea, or vomiting noted at this time. 09/03/17 on evaluation today patient appears to be doing okay in regard to her lower extremities in regard to the lymphedema and weeping. Fortunately she does not seem to show any signs of infection at this point she does have a little bit of weeping occurring in the right medial malleolus area. With that being said this does not appear to be too significant which is good news. 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 She has severe skin damage secondary to both of these features involving the dorsal left foot and medial left ankle and lower leg. Still has open areas in the left anterior foot. The area on the right closed over. She uses her own juxta light stockings. She does not have an arterial issue 09/16/17 on evaluation today patient actually appears to be doing excellent in regard to her bilateral lower extremity swelling. The Juxta-Lite compression wrap seem to be doing very well for her. She has not however been using the portion that goes over her foot. Her left foot still is draining a little bit not nearly as significant as it has been in the past but still I do believe that she likely needs to utilize the full wrap including the foot portion of this will improve as well. She also has been apparently putting on a significant amount of Vaseline which also think is not helpful for her. I recommended that if she feels she needs something for moisturizer Eucerin will probably be better. 09/30/17 on evaluation today patient presents with several new open areas in regard to her left lower extremity although these appear to be minimal and mainly seem to be more moisture breakdown than anything. Fortunately she does not seem to have any evidence of infection which is  great news. She has been tolerating the dressing changes without complication we are using silver alginate on the foot she has been using AB pads to have the legs and using her Juxta- Lite compression which seems to be controlling her  swelling very well. Overall I'm pleased with the poor way she has progressed. 10/14/17 on evaluation today patient appears to be doing better in regard to her left lower extremity areas of weeping. She does still have some discomfort although in general this does not appear to be as macerated and I think it is progressing nicely. I do think she still needs to wear the foot portion of her Juxta- Lite in order to get the most benefit from the wrap obviously. She states she understands. Fortunately there does not appear to be evidence of infection at this time which is great news. 10/28/17 on evaluation today patient appears to be doing excellent in regard to her left lower extremity. She has just a couple areas that are still open and seem to be causing any trouble whatsoever. For that reason I think that she is definitely headed in the right direction the spots are very tiny compared to what we have been dealing with in the past. 11/11/17 on evaluation today patient appears to have a right lateral lower extremity ulcer that has opened since I last saw her. She states this is where the home health nurse that was coming out remove the dressing without wetting the alginate first. Nonetheless I do not know if this is indeed the case or not but more importantly we have not ordered home help to be coming out for her wounds at all. I'm unsure as to why they are coming out and we're gonna have to check on this and get things situated in that regard. With that being said we currently really do not need them to be coming out as the patient has been taking care of her leg herself without complication and no issues. In fact she was doing much better prior to nursing coming out. 11/25/17  on evaluation today patient actually appears to be doing fairly well in regard to her left lower extremity swelling. In fact she has very little area of weeping at this point there's just a small spot on the lateral portion of her right leg that still has me just a little bit more concerned as far as wanting to see this clear up before I discharge her to caring for this at home. Nonetheless overall she has made excellent progress. 12/09/17 on evaluation today patient appears to be doing rather well in regard to her lower extremity edema. She does have some weeping still in the left lower extremity although the big area we were taking care of two weeks ago actually has closed and she has another area of weeping on the left lower extremity immediately as well is the top of her foot. She does not currently have lymphedema pumps she has been wearing her compression daily on a regular basis as directed. With that being said I think she may benefit from lymphedema pumps. 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 We had a very difficult time getting and keeping this under control. 12/23/17 on evaluation today patient actually appears to be doing a little bit more poorly in regard to her bilateral lower extremities. She has been tolerating the Juxta-Lite compression wraps. Unfortunately she has two new ulcers on the right lower extremity and left lower Trinity ulceration seems to be larger. Obviously this is not good news. She has been tolerating the dressings without complication. 12/30/17  on evaluation today patient actually appears to be doing much better in regard to her bilateral lower extremity edema. She continues to have some issues with ulcerations and in fact there appears to be one spot on each leg where the wrap may have caused a little bit of a  blister which is subsequently opened up at this point is given her pain. Fortunately it does not appear to be any evidence of infection which is good news. No fevers chills noted. 01/13/18 on evaluation today patient appears to be doing rather well in regard to her bilateral lower extremities. The dressings did get kind of stuck as far as the wound beds are concerned but again I think this is mainly due to the fact that she actually seems to be showing signs of healing which is good news. She's not having as much drainage therefore she was having more of the dressing sticking. Nonetheless overall I feel like her swelling is dramatically down compared to previous. 01/20/18 on evaluation today patient unfortunately though she's doing better in most regards has a large blister on the left anterior lower extremity where she is draining quite significantly. Subsequently this is going to need debridement today in order to see what's underneath and ensure she does not continue to trapping fluid at this location. Nonetheless No fevers, chills, nausea, or vomiting noted at this time. 01/27/18 on evaluation today patient appears to be doing rather well at this point in regard to her right lower extremity there's just a very small area that she still has open at this point. With that being said I do believe that she is tolerating the compression wraps very well in making good progress. Home health is coming out at this point to see her. Her left lower extremity on the lateral portion is actually what still mainly open and causing her some discomfort for the most part 02/10/18 on evaluation today patient actually appears to be doing very well in regard to her right lower extremity were all the ulcers appear to be completely close. In regard to the left lower extremity she does have two areas still open and some leaking from the dorsal surface of her foot but this still seems to be doing much better to me in  general. 02/24/18 on evaluation today patient actually appears to be doing much better in regard to her right lower extremity this is still completely healed. Her left lower extremity is also doing much better fortunately she has no evidence of infection. The one area that is gonna require some debridement is still on the left anterior shin. Fortunately this is not hurting her as badly today. 03/10/18 on evaluation today patient appears to be doing better in some regards although she has a little bit more open area on the dorsal foot and she also has some issues on the medial portion of the left lower extremity which is actually new and somewhat deep. With that being said there fortunately does not appear to be any significant signs of infection which is good news. No fevers, chills, nausea, or vomiting noted at this time. In general her swelling seems to be doing fairly well which is good news. 03/31/18 on evaluation today patient presents for follow-up concerning her left lower extremity lymphedema. Unfortunately she has been doing a little bit more poorly since I last saw her in regard to the amount of weeping that she is experiencing. She's also having some increased pain in the anterior shin location. Unfortunately I  do not feel like the patient is making such good progress at this point a few weeks back she was definitely doing much better. 04/07/18 on evaluation today patient actually appears to be showing some signs of improvement as far as the left lower extremity is concerned. She has been tolerating the dressing changes and it does appear that the Drawtex did better for her. With that being said unfortunately home health is stating that they cannot obtain the Drawtex going forward. Nonetheless we're gonna have to check and see what they may be able to get the alginate they were using was getting stuck in causing new areas of skin being pulled all that with and subsequently weep and calls her  to worsen overall this is the first time we've seen improvement at this time. 04/14/18 on evaluation today patient actually appears to be doing rather well at this point there does not appear to be any evidence of infection at this time and she is actually doing excellent in regard to the weeping in fact she almost has no openings remaining even compared to just last week this is a dramatic improvement. No fevers chills noted 04/21/18 evaluation today patient actually appears to be doing very well. She in fact is has a small area on the posterior lower extremity location and she has a small area on the dorsal surface of her foot that are still open both of which are very close to closing. We're hoping this will be close shortly. She brought her Juxta-Lite wrap with her today hoping that would be able to put her in it unfortunately I don't think were quite at that point yet but we're getting closer. 04/28/18 upon evaluation today patient actually appears to be doing excellent in regard to her left lower extremity ulcer. In fact the region on the posterior lower extremity actually is much smaller than previously noted. Overall I'm very happy with the progress she has made. She again did bring her Juxta-Lite although we're not quite ready for that yet. 05/11/18 upon evaluation today patient actually appears to be doing in general fairly well in regard to her left lower Trinity. The swelling is very well controlled. With that being said she has a new area on the left anterior lower extremity as well as between the first and second toes of her left foot that was not present during the last evaluation. The region of her posterior left lower extremity actually appears to be almost completely healed. T be honest I'm very pleased with o the way that stands. Nonetheless I do believe that the lotion may be keeping the area to moist as far as her legs are concerned subsequently I'm gonna consider discontinuing  that today. 05/26/18 on evaluation today patient appears to be doing rather well in regard to her left lower should be ulcers. In fact everything appears to be close except for a very small area on the left posterior lower extremity. Fortunately there does not appear to be any evidence of infection at this time. Overall very pleased with her progress. 06/02/18 and evaluation today patient actually appears to be doing very well in regard to her lower extremity ulcers. She has one small area that still continues to weep that I think may benefit her being able to justify lotion and user Juxta-Lite wraps versus continued to wrap her. Nonetheless I think this is something we can definitely look into at this point. 06/23/18 on evaluation today patient unfortunately has openings of her bilateral lower  extremities. In general she seems to be doing much worse than when I last saw her just as far as her overall health standpoint is concerned. She states that her discomfort is mainly due to neuropathy she's not having any other issues otherwise. No fevers, chills, nausea, or vomiting noted at this time. 06/30/18 on evaluation today patient actually appears to be doing a little worse in regard to her right lower extremity her left lower extremity of doing fairly well. Fortunately there is no sign of infection at this time. She has been tolerating the dressing changes without complication. Home health did not come out like they were supposed to for the appropriate wrap changes. They stated that they never received the orders from [place] which were fax. Nonetheless we will send a copy of the orders with the patient today as well. 07/07/18 on evaluation today patient appears to be doing much better in regard to lower extremities. She still has several openings bilaterally although since I last saw her her legs did show obvious signs of infection when she later saw her nurse. Subsequently a culture was obtained and she is  been placed on Bactrim and Keflex. Fortunately things seem to be looking much better it does appear she likely had an infection. Again last week we'd even discussed it but again there really was not any obvious sign that she had infection therefore we held off on the antibiotics. Nonetheless I'm glad she's doing better today. 07/14/18 on evaluation today patient appears to be doing much better regarding her bilateral lower Trinity's. In fact on the right lower for me there's nothing open at this point there are some dry skin areas at the sites where she had infection. Fortunately there is no evidence of systemic infection which is excellent news. No fevers chills noted 07/21/18 on evaluation today patient actually appears to be doing much better in regard to her left lower extremity ulcers. She is making good progress and overall I feel like she's improving each time I see her. She's having no pain I do feel like the infection is completely resolved which is excellent news. No fevers, chills, nausea, or vomiting noted at this time. 07/28/18 on evaluation today patient appears to be doing very well in regard to her left lower [employer]. Everything seems to be showing signs of improvement which is excellent news. Overall very pleased with the progress that has been made. Fortunately there's no evidence of active infection at this time also excellent news. 08/04/18 on evaluation today patient appears to be doing more poorly in regard to her bilateral lower extremities. She has two new areas open up on the right and these were completely closed as of last week. She still has the two spots on the left which in my pinion seem to be doing better. Fortunately there's no evidence of infection again at this point. 08/11/18 on evaluation today patient actually appears to be doing very well in regard to her bilateral lower Trinity wounds that all seem to be doing better and are measures smaller today.  Fortunately there's no signs of infection. No fevers, chills, nausea, or vomiting noted at this time. 08/18/18 on evaluation today patient actually appears to be doing about the same inverter bilateral lower extremities. She continues to have areas that blistering open as was drain that fortunately nothing too significant. Overall I feel like Drawtex may have done better for her however compared to the collagen. 08/25/18 on evaluation today patient appears to be doing  a little bit more poorly today even compared to last time I saw her. Again I'm not exactly sure why she's making worse progress over the past several weeks. I'm beginning to wonder if there is some kind of underlying low level infection causing this issue. I did actually take a culture from the left anterior lower extremity but it was a new wound draining quite a bit at this point. Unfortunately she also seems to be having more pain which is what also makes me worried about the possibility of infection. This is despite never erythema noted at this point. 09/01/18 on evaluation today patient actually appears to be doing a little worse even compared to last week in regard to bilateral lower extremities. She did go to the hospital on the 16th was given a dose of IV Zosyn and then discharged with a recommendation to continue with the Bactrim that I previously prescribed for her. Nonetheless she is still having a lot of discomfort she tells me as well at this time. This is definitely unfortunate. No fevers, chills, nausea, or vomiting noted at this time. 09/08/18 on evaluation today patient's bilateral lower extremities actually appear to be shown signs of improvement which is good news. Fortunately there does not appear to be any signs of active infection I think the anabiotic is helping in this regard. Overall I'm very pleased with how she is progressing. 09/15/18 patient was actually seen in ER yesterday due to her legs as well unfortunately. She  states she's been having a lot of pain and discomfort as well as a lot of drainage. Upon inspection today the patient does have a lot of swelling and drainage I feel like this is more related to lymphedema and poor fluid control than it is to infection based on what I'm seeing. The physician in the emergency department also doubted that the patient was having a significant infection nonetheless I see no evidence of infection obvious at this point although I do see evidence of poor fluid control. She still not using a compression pumps, she is not elevating due to her lift chair as well as her hospital bed being broken, and she really is not keeping her legs up as much as they should be and also has been taking off her wraps. All this combined I think has led to poor fluid control and to be honest she may be somewhat volume overloaded in general as well. I recommend that she may need to contact your physician to see if a prescription for a diuretic would be beneficial in their opinion. As long as this is safe I think it would likely help her. 09/29/18 on evaluation today patient's left lower extremity actually appears to be doing quite a bit better. At least compared to last time that I saw her. She still has a large area where she is draining from but there's a lot of new skin speckled trout and in fact there's more new skin that there are open areas of weeping and drainage at this point. This is good news. With regard to the right lower extremity this is doing much better with the only open area that I really see being a dry spot on the right lateral ankle currently. Fortunately there's no signs of active infection at this time which is good news. No fevers, chills, nausea, or vomiting noted at this time. The patient seems somewhat stressed and overwhelmed during the visit today she was very lethargic as such. She does and she  is not taking any pain medications at this point. Apparently according to her  husband are also in the process of moving which is probably taking its toll on her as well. 10/06/18 on evaluation today patient appears to be doing rather well in regard to her lower extremities compared to last evaluation. Fortunately there's no signs of active infection. She tells me she did have an appointment with her primary. Nonetheless he was concerned that the wounds were somewhat deep based on pictures but we never actually saw her legs. She states that he had her somewhat worried due to the fact that she was fearing now that she was [place] have to have an amputation. With that being said based on what I'm seeing check she looks better this week that she has the last two times I've seen her with much less drainage I'm actually pleased in this regard. That doesn't mean that she's out of the water but again I do not think what the point of talking about education at all in regard to her leg. She is very happy to hear this. She is also not having as much pain as she was having last week. 10/13/18 unfortunately on evaluation today patient still continues to have a significant amount of drainage she's not letting home health actually apply the compression dressings at this point. She's trying to use of Juxta-Lite of the top of Kerlex and the second layer of the three layer compression wrap. With that being said she just does not seem to be making as good a progress as I would expect if she was having the compression applied and in place on a regular basis. No fevers, chills, nausea, or vomiting noted at this time. 10/20/18 on evaluation today patient appears to be doing a little better in regard to her bilateral lower extremity ulcers. In fact the right lower extremity seems to be healed she doesn't even have any openings at this point left lower extremity though still somewhat macerated seems to be showing signs of new skin growth at multiple locations throughout. Fortunately there's no evidence of  active infection at this time. No fevers, chills, nausea, or vomiting noted at this time. 10/27/18 on evaluation today patient appears to be doing much better in regard to her left lower Trinity ulcer. She's been tolerating the laptop complication and has minimal drainage noted at this point. Fortunately there's no signs of active infection at this time. No fevers, chills, nausea, or vomiting noted at this time. 11/03/18 on evaluation today patient actually appears to be doing excellent in regard to her left lower extremity. She is having very little drainage at this point there does not appear to be any significant signs of infection overall very pleased with how things have gone. She is likewise extremely pleased still and seems to be making wonderful progress week to week. I do believe antibiotics were helpful for her. Her primary care provider did place on amateur clean since I last saw her. 11/17/18 on evaluation today patient appears to be doing worse in regard to her bilateral lower extremities at this point. She is been tolerating the dressing changes without complication. With that being said she typically takes the [place] off fairly quickly upon arriving home even after being seen here in the clinic and does not allow home health reapply command as part of the dressing at home. Therefore she said no compression essentially since I last saw her as best I can tell. With that being said I  think it shows and how much swelling she has in the open wounds that are noted at this point. Fortunately there's no signs of infection but unfortunately if she doesn't get this under control I think she will end up with infection and more significant issues. 11/24/18 on evaluation today patient actually appears to be doing somewhat better in regard to her bilateral lower extremities. 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 Nonetheless that is doing better currently so she should be able to attempt reinitiating the lymphedema pumps at this time. No fevers, chills, nausea, or vomiting noted at this time. 12/01/18 upon evaluation today patient's left lower extremity appears to be doing quite well unfortunately her right lower extremity is not doing nearly as well. She has been tolerating the dressing changes without complication unfortunately she did not keep a wrap on the right at this time. Nonetheless I believe this has led to increased swelling and weeping in the world is actually much larger than during the last evaluation with her. 12/08/18 on evaluation today patient appears to be doing about the same at this point in regard to her right lower extremity. There is some more palatable to touch I'm concerned about the possibility of there being some infection although I think the main issue is she's not keeping her compression wrap on which in turn is not allowing this area to heal appropriately. 12/22/18 on evaluation today patient appears to be doing better in regard to left lower extremity unfortunately significantly worse in regard to the right lower extremity. The areas of blistering and necrotic superficial tissue have spread and again this does not really appear to be signs of infection and all she just doesn't seem to be doing nearly as well is what she has been in the past. Overall I feel like the Augmentin did absolutely nothing for her she doesn't seem to have any infection again I really didn't think so last time either is more of a potential preventative measure and hoping that this would make some difference but I think the main issue is she's not wearing her compression. She tells me she cannot wear the [place] been we put on she takes it off pretty much upon getting home. Subsequently she worshiped Juxta-Lite when I questioned her about how often she  wears it this is no more than three hours a day obviously that leaves 21 hours that she has no compression and this is obviously not doing well for her. Overall I'm concerned that if things continue to worsen she is at great risk of both infection as well as losing her leg. 01/05/19 on evaluation today patient appears to be doing well in regard to her left lower extremity which he is allowing [place] to wrap and not so well with regard to her right lower extremity which she is not allowing [place] to really wrap and keep the wrap on. She states that it hurts too badly whenever it's wrapped and she ends up having to take it off. She's been using the Juxta-Lite she tells me up to six hours a day although I question whether or not that's really been the case to be honest. Previously she told me three hours today nonetheless obviously the legs as long as the wrap is doing great when she is not is  doing much more poorly. 01/12/2019 on evaluation today patient actually appears to be doing a little better in my opinion with regard to her right lower extremity ulcer. She has a small open area on the left lower extremity unfortunately but again this I think is part of the normal fluctuation of what she is going to have to expect with regard to her legs especially when she is not using her lymphedema pumps on a regular basis. Subsequently based on what I am seeing today I think that she does seem to be doing slightly better with regard to her right lower extremity she did see her primary care provider on Monday they felt she had an infection and placed her on 2 antibiotics. Both Cipro and clindamycin. Subsequently again she seems possibly to be doing a little bit better in regards to the right lower extremity she also tells me however she has been wearing the compression wrap over the past week since I spoke with her as well that is a Kerlix and Coban wrap on the right. No fevers, chills, nausea, vomiting, or  diarrhea. 01/19/2019 on evaluation today patient appears to be doing better with regard to her bilateral lower extremities especially the right. I feel like the compression has been beneficial for her which is great news. She did get a call from her primary care provider on her way here today telling her that she did have methicillin- resistant Staphylococcus aureus and he was calling in a couple new antibiotics for her including a ointment to be applied she tells me 3 times a day. With that being said this sounds like likely to be Bactroban which I think could be applied with each dressing/wrap change but I would not be able to accommodate her applying this 3 times a day. She is in agreement with the least doing this we will add that to her orders today. 01/26/2019 on evaluation today patient actually appears to be doing much better with regard to her right lower extremity. Her left lower extremity is also doing quite well all things considering. Fortunately there is no evidence of active infection at this time. No fevers, chills, nausea, vomiting, or diarrhea. 02/02/2019 on evaluation today patient appears to be doing much better compared to her last evaluation. Little by little off like her right leg is returning more towards normal. There does not appear to be any signs of active infection and overall she seems to be doing quite well which is great news. I am very pleased in this regard. No fevers, chills, nausea, vomiting, or diarrhea. 02/09/2019 upon evaluation today patient appears to be doing better with regard to her bilateral lower extremities. She has been tolerating the dressing changes without complication. Fortunately there is no signs of active infection at this time. No fevers, chills, nausea, vomiting, or diarrhea. 02/23/2019 on evaluation today patient actually appears to be doing quite well with regard to her bilateral lower extremities. She has been tolerating the dressing changes without  complication. She is even used her pumps one time and states that she really felt like it felt good. With that being said she seems to be in good spirits and her legs appear to be doing excellent. 03/09/2019 on evaluation today patient appears to be doing well with regard to her right lower extremity there are no open wounds at this time she is having some discomfort but I feel like this is more neuropathy than anything. With regard to her left lower extremity she had  several areas scattered around that she does have some weeping and drainage from but again overall she does not appear to be having any significant issues and no evidence of infection at this time which is good news. 03/23/2019 on evaluation today patient appears to be doing well with regard to her right lower extremity which she tells me is still close she is using her juxta light here. Her left lower extremity she mainly just has an area on the foot which is still slightly draining although this also is doing great. Overall very pleased at this time. 04/06/2019 patient appears to be doing a little bit worse in regard to her left lower extremity upon evaluation today. She feels like this could be becoming infected again which she had issues with previous. Fortunately there is no signs of systemic infection but again this is always a struggle with her with her legs she will go from doing well to not so well in a very short amount of time. 04/20/2019 on evaluation today patient actually appears to be doing quite well with regard to her right lower extremity I do not see any signs of active infection at this time. Fortunately there is no fever chills noted. She is still taking the antibiotics which I prescribed for her at this point. In regard to the left lower extremity I do feel like some of these areas are better although again she still is having weeping from several locations at this time. 04/27/2019 on evaluation today patient appears  to be doing about the same if not slightly worse in regard to her left lower extremity ulcers. She tells me when questioned that she has been sleeping in her Hoveround chair in fact she tells me she falls asleep without even knowing it. I think she is spending a whole lot of time in the chair and less time walking and moving around which is not good for her legs either. On top of that she is in a seated position which is also the worst position she is not really elevating her legs and she is also not using her lymphedema pumps. All this is good to contribute to worsening of her condition in general. 05/18/2019 on evaluation today patient appears to be doing well with regard to her lower extremity on the right in fact this is showing no signs of any open wounds at this time. On the left she is continuing to have issues with areas that do drain. Some of the regions have healed and there are couple areas that have reopened. She did go to the ER per the patient according to recommendations from the home health nurse due to what she was seen when she came out on 05/13/2019. Subsequently she felt like the patient needed to go to the hospital due to the fact that again she was having "milky white discharge" from her leg. Nonetheless she had and then was placed on doxycycline and subsequently seems to be doing better. 06/01/2019 upon evaluation today patient appears to be doing really in my opinion about the same. I do not see any signs of active infection which is good news. Overall she still has wounds over the bilateral lower extremities she has reopened on the right but this appears to be more of a crack where there is weeping/edema coming from the region. I do not see any evidence of infection at either site based on what I visualized today. 07/13/2019 upon evaluation today patient appears to be doing a little  worse compared to last time I saw her. She since has been in the hospital from 06/21/2019 through  06/29/2019. This was secondary to having Covid. During that time they did apply lotion to her legs which unfortunately has caused her to develop a myriad of open wounds on her lower extremities. Her legs do appear to be doing better as far as the overall appearance is concerned but nonetheless she does have more open and weeping areas. 07/27/2019 upon evaluation today patient appears to be doing more poorly to be honest in regard to her left lower extremity in particular. There is no signs of systemic infection although I do believe she may have local infection. She notes she has been having a lot of blue/green drainage which is consistent potentially with Pseudomonas. That may be something that we need to consider here as well. The doxycycline does not seem to have been helping. 08/03/2019 upon evaluation today patient appears to be doing a little better in my opinion compared to last week's evaluation. Her culture I did review today and she is on appropriate medications to help treat the Enterobacter that was noted. Overall I feel like that is good news. With that being said she is unfortunately continuing to have a lot of drainage and though it is doing better I still think she has a long ways to go to get things dried up in general. Fortunately there is no signs of systemic infection. 08/10/2019 upon evaluation today patient appears to be doing may be slightly better in regard to her left lower extremity the right lower extremity is doing much better. Fortunately there is no signs of infection right now which is good news. No fevers, chills, nausea, vomiting, or diarrhea. 08/24/2019 on evaluation today patient appears to be doing slightly better in regard to her lower extremities. The left lower extremity seems to be healed the right lower extremity is doing better though not completely healed as far as the openings are concerned. She has some generalized issues here with edema and weeping secondary to  her lymphedema though again I do believe this is little bit drier compared to prior weeks evaluations. In general I am very pleased with how things seem to be progressing. No fevers, chills, nausea, vomiting, or diarrhea. 08/31/2019 upon evaluation today patient actually seems to making some progress here with regard to the left lower extremity in particular. She has been tolerating the dressing changes without complication. Fortunately there is no signs of active infection at this time. No fevers, chills, nausea, vomiting, or diarrhea. She did see Dr. Doren Custard and he did note that she did have a issue with the left great saphenous vein and the small saphenous vein in the leg. With that being said he was concerned about the possibility of laser ablation not being extremely successful. He also mentioned a small risk of DVT associated with the procedure. However if the wounds do not continue to improve he stated that that would probably be the way to go. Fortunately the patient's legs do seem to be doing much better. 09/07/2019 upon evaluation today patient appears to be doing better with regard to her lower extremities. She has been tolerating the dressing changes without complication. With that being said she is showing signs of improvement and overall very pleased. There are some areas on her leg that I think we do need to debride we discussed this last week the patient is in agreement with doing that as long as it does not hurt  too badly. 09/14/2019 upon evaluation today patient appears to be doing decently well with regard to her left lower extremity. She is not having near as much weeping as she has had in the past things seem to be drying up which is good news. There is no signs of active infection at this time. 09/21/19 upon evaluation today patient appears to be doing better in regard overall to her bilateral lower extremities. She again has less open than she did previous and each week I feel like this  is getting better. Fortunately there is no signs of active infection at this time. No fevers, chills, nausea, vomiting, or diarrhea. 09/28/2019 upon evaluation today patient actually appears to be showing signs of improvement with regard to her left lower extremity. Unfortunately the right medial lower extremity around the ankle region has reopened to some degree but this appears to be minimal still which is good news. There is no signs of active infection at this time which is also good news. 10/12/2019 upon evaluation today patient appears to be doing okay with regard to her bilateral lower extremities today. The right is a little bit worse then last evaluation 2 weeks ago. The left is actually doing a little better in my opinion. Overall there is no signs of active infection at this time that I see. Obviously that something we have to keep a close eye on she is very prone to this with the significant and multiple openings that she has over the bilateral lower extremities. 10/19/2019 upon evaluation today patient appears to be doing about the best that I have seen her in quite some time. She has been tolerating the dressing changes without complication. There does not appear to be any signs of active infection and overall I am extremely happy with the way her legs appeared. She is drying up quite nicely and overall is having less pain. 11/09/2019 upon evaluation today patient appears to be doing better in regard to her wounds. She seems to be drying up more and more each time I see her this is just taking a very long time. Fortunately there is no signs of active infection at this time. 11/23/2019 upon evaluation today patient actually appears to be doing excellent in regard to her lower extremities at this point compared to where she has been. Fortunately there is no signs of active infection at this time. She did go to the hospital last week for nausea and vomiting completely unrelated to her  wounds. Fortunately she is doing better she was given some Reglan and got better. She had associated abdominal pain but they never found out what was going on. 12/07/2019 upon evaluation today patient appears to be doing well for the most part in regard to her legs. She unfortunately has not been keeping the Coban portion of her wraps on therefore the compression has not really been sufficient for what it is supposed to be. Nonetheless she tells me that it just hurt too bad therefore she removed it. 12/21/2019 upon evaluation today patient actually appears to be doing quite well with regard to her legs. I do feel like she has been making progress which is great news and overall there is no signs of active infection at this time. No fevers, chills, nausea, vomiting, or diarrhea. 01/04/2020 upon evaluation today patient presents for follow-up concerning her lower extremity edema bilaterally. She still has open wounds she has not been using her lymphedema pumps. She is also not been utilizing her compression wraps  appropriately she tends to unwrap them, take them off, or states that they hurt. Obviously the reason they hurt is because her legs start to swell but the issue is if she would use her compression/lymphedema pumps regularly she would not swell and she would have the pain. Nonetheless she has not even picked them up once honestly over the past several months and may be even as much as in the past year based on my opinion and what have seen. She tells me today that after last week when I talked about this with her specifically actually that was 2 weeks ago that she "forgot". Objective Constitutional Well-nourished and well-hydrated in no acute distress. Vitals Time Taken: 10:19 AM, Height: 62 in, Weight: 335 lbs, BMI: 61.3, Temperature: 98.4 F, Pulse: 106 bpm, Respiratory Rate: 18 breaths/min, Blood Pressure: 164/81 mmHg, Capillary Blood Glucose: 161 mg/dl. General Notes: glucose per pt  report Respiratory normal breathing without difficulty. Psychiatric this patient is able to make decisions and demonstrates good insight into disease process. Alert and Oriented x 3. pleasant and cooperative. General Notes: Patient's wounds currently are showing signs of minimal improvement there is some improvement but again this is slight she still has a lot of weeping and a lot of maceration in general. Integumentary (Hair, Skin) Wound #61 status is Open. Original cause of wound was Gradually Appeared. The wound is located on the Left,Circumferential Lower Leg. The wound measures 12.5cm length x 28cm width x 0.1cm depth; 274.889cm^2 area and 27.489cm^3 volume. There is Fat Layer (Subcutaneous Tissue) Exposed exposed. There is no tunneling or undermining noted. There is a large amount of serous drainage noted. The wound margin is flat and intact. There is medium (34-66%) pink, pale granulation within the wound bed. There is a medium (34-66%) amount of necrotic tissue within the wound bed including Adherent Slough. Wound #64 status is Open. Original cause of wound was Gradually Appeared. The wound is located on the Left,Dorsal Foot. The wound measures 7cm length x 7.5cm width x 0.1cm depth; 41.233cm^2 area and 4.123cm^3 volume. There is Fat Layer (Subcutaneous Tissue) Exposed exposed. There is no tunneling or undermining noted. There is a medium amount of serous drainage noted. The wound margin is flat and intact. There is large (67-100%) pink, pale granulation within the wound bed. There is a small (1-33%) amount of necrotic tissue within the wound bed including Adherent Slough. Wound #65 status is Open. Original cause of wound was Gradually Appeared. The wound is located on the Right,Medial Lower Leg. The wound measures 0.9cm length x 1cm width x 0.1cm depth; 0.707cm^2 area and 0.071cm^3 volume. There is Fat Layer (Subcutaneous Tissue) Exposed exposed. There is no tunneling or undermining noted.  There is a small amount of serosanguineous drainage noted. The wound margin is distinct with the outline attached to the wound base. There is large (67-100%) pink granulation within the wound bed. There is no necrotic tissue within the wound bed. 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 There was a Three Layer Compression Therapy Procedure by Carlene Coria, RN. Post procedure Diagnosis Wound #61: Same as Pre-Procedure Wound #65 Pre-procedure diagnosis of Wound #65 is a Venous Leg Ulcer located on the Right,Medial Lower Leg . There was a Three Layer Compression Therapy Procedure by Carlene Coria, RN. Post procedure Diagnosis Wound #65: Same as Pre-Procedure Plan Follow-up Appointments: Return Appointment in 2 weeks. Dressing Change Frequency: Wound #61 Left,Circumferential Lower Leg: Change dressing three times week. Wound #64 Left,Dorsal Foot: Change dressing three times week. Wound #65 Right,Medial Lower Leg: Change dressing three times week. Skin Barriers/Peri-Wound Care: Barrier cream - zinc oxide cream to any macerated areas Moisturizing lotion - to dry skin Wound Cleansing: Clean wound with Wound Cleanser - all wounds, wash legs  with soap and water with dressing changes May shower with protection. Primary Wound Dressing: Wound #61 Left,Circumferential Lower Leg: Calcium Alginate with Silver Wound #64 Left,Dorsal Foot: Calcium Alginate with Silver Wound #65 Right,Medial Lower Leg: Calcium Alginate with Silver Secondary Dressing: Wound #61 Left,Circumferential Lower Leg: Dry Gauze ABD pad - as needed Wound #64 Left,Dorsal Foot: Dry Gauze ABD pad - as needed Wound #65 Right,Medial Lower Leg: Dry Gauze Edema Control: 3 Layer Compression System - Bilateral Avoid standing for long periods of time - walking is encouraged Elevate legs to the level of the heart or above for 30 minutes daily and/or when sitting, a frequency of: - do not sleep in chair with feet dangling, MUST elevate legs while sitting Exercise regularly Segmental Compressive Device. - lymphedema pumps 60 minutes 1- 2 times per day Off-Loading: Turn and reposition every 2 hours Additional Orders / Instructions: Follow Nutritious Diet - T include vitamin A, vitamin C, and Zinc along with increased protein intake. o Home Health: [facility]. - [facility]. I am going to suggest currently that we go ahead and initiate treatment with a continuation of the barrier cream followed by the silver alginate dressing and then subsequently the bilateral 3 layer compression wraps. I explained to the patient she needs to keep these wraps in place she should not be removing them. 2. If she starts to have pain she should be elevating her legs that is why her legs are hurting because they are starting to swell. 3. She needs to be using her lymphedema pumps 2 times per day every day obviously she has not used these in months. She tells me she keeps forgetting and it said that they are just "sitting they are going to waste". We will see patient back for reevaluation in 2 weeks here in the clinic. If anything worsens or changes  patient will contact our office for additional recommendations. Electronic Signature(s) Signed: 01/04/2020 11:02:30 AM By: [name] PA-C Entered By: [name] on 01/04/2020 11:02:29 -------------------------------------------------------------------------------- SuperBill Details Patient Name: Date of Service: [name] 01/04/2020 Medical Record Number: [id number] Patient Account Number: [id number] Date of Birth/Sex: Treating RN: 1951-03-23 (69 y.o. [name] Primary Care Provider: [name] Other Clinician: Referring Provider: Treating Provider/Extender: Doyle Askew, FRED Weeks in Treatment: 126 Diagnosis Coding ICD-10 Codes Code Description E11.622 Type 2 diabetes mellitus with other skin ulcer I89.0 Lymphedema, not elsewhere classified I87.331 Chronic venous hypertension (idiopathic) with ulcer and inflammation of right lower extremity I87.332 Chronic venous hypertension (idiopathic) with ulcer and inflammation of left lower extremity L97.812 Non-pressure  chronic ulcer of other part of right lower leg with fat layer exposed L97.822 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 I10 Essential (primary) hypertension E66.01 Morbid (severe) obesity due to excess calories F41.8 Other specified anxiety disorders R53.1 Weakness Facility Procedures The patient participates with Medicare or their insurance follows the Medicare Facility Guidelines: CPT4 Description Modifier Quantity Code [id number] BILATERAL: Application of multi-layer venous compression system; leg (below knee), including ankle and 1 foot. Physician Procedures : CPT4 Code Description Modifier [id number] - WC PHYS LEVEL 3 - EST PT ICD-10 Diagnosis Description E11.622 Type 2 diabetes mellitus with other skin ulcer I89.0 Lymphedema, not elsewhere classified  I87.331 Chronic venous hypertension (idiopathic) with  ulcer and inflammation of right lower extremity I87.332 Chronic venous hypertension (idiopathic) with ulcer and inflammation of left lower extremity Quantity: 1 Electronic Signature(s) Signed: 01/04/2020 11:03:29 AM By: [name] PA-C Entered By: [name] on 01/04/2020 11:03:28

## 2020-01-06 NOTE — Progress Notes (Signed)
[name], [name] ([id number]) Visit Report for 01/04/2020 Arrival Information Details Patient Name: Date of Service: [name], [name] 01/04/2020 10:00 A M Medical Record Number: [id number] Patient Account Number: [id number] Date of Birth/Sex: Treating RN: Nov 19, 1950 (69 y.o. [name] Primary Care Simon Llamas: [name] Other Clinician: Referring Verlie Liotta: Treating Rolfe Hartsell/Extender: Doyle Askew, FRED Weeks in Treatment: 126 Visit Information History Since Last Visit Added or deleted any medications: No Patient Arrived: Wheel Chair Any new allergies or adverse reactions: No Arrival Time: 10:19 Had a fall or experienced change in No Accompanied By: alone activities of daily living that may affect Transfer Assistance: None risk of falls: Patient Identification Verified: Yes Signs or symptoms of abuse/neglect since last visito No Secondary Verification Process Completed: Yes Hospitalized since last visit: No Patient Requires Transmission-Based Precautions: No Implantable device outside of the clinic excluding No Patient Has Alerts: Yes cellular tissue based products placed in the center Patient Alerts: R ABI= 1.01 since last visit: L ABI = .99 Has Dressing in Place as Prescribed: Yes Has Compression in Place as Prescribed: No Pain Present Now: Yes Electronic Signature(s) Signed: 01/05/2020 5:02:14 PM By: Levan Hurst RN, BSN Entered By: Levan Hurst on 01/04/2020 10:19:53 -------------------------------------------------------------------------------- Compression Therapy Details Patient Name: Date of Service: [name]. 01/04/2020 10:00 A M Medical Record Number: [id number] Patient Account Number: [id number] Date of Birth/Sex: Treating RN: 1951/03/29 (69 y.o. [name] Primary Care Ajani Rineer: [name] Other Clinician: Referring Lakeishia Truluck: Treating Kire Ferg/Extender: Doyle Askew, FRED Weeks in Treatment: 126 Compression Therapy  Performed for Wound Assessment: Wound #61 Left,Circumferential Lower Leg Performed By: Clinician Carlene Coria, RN Compression Type: Three Layer Post Procedure Diagnosis Same as Pre-procedure Electronic Signature(s) Signed: 01/05/2020 4:56:58 PM By: [name] RN, BSN Entered By: [name] on 01/04/2020 10:54:01 -------------------------------------------------------------------------------- Compression Therapy Details Patient Name: Date of Service: [name]. 01/04/2020 10:00 A M Medical Record Number: [id number] Patient Account Number: [id number] Date of Birth/Sex: Treating RN: [birth year] (69 y.o. [name] Primary Care Leza Apsey: [name] Other Clinician: Referring Mariadelcarmen Corella: Treating [name]/Extender: Doyle Askew, FRED Weeks in Treatment: 126 Compression Therapy Performed for Wound Assessment: Wound #65 Right,Medial Lower Leg Performed By: Clinician Carlene Coria, RN Compression Type: Three Layer Post Procedure Diagnosis Same as Pre-procedure Electronic Signature(s) Signed: 01/05/2020 4:56:58 PM By: [name] RN, BSN Entered By: [name] on 01/04/2020 10:54:01 -------------------------------------------------------------------------------- Encounter Discharge Information Details Patient Name: Date of Service: [name]. 01/04/2020 10:00 A M Medical Record Number: [id number] Patient Account Number: [id number] Date of Birth/Sex: Treating RN: 12-Nov-1950 (69 y.o. [name] Primary Care Orlin Kann: [name] Other Clinician: Referring Amyrie Illingworth: Treating Zamoria Boss/Extender: Doyle Askew, FRED Weeks in Treatment: [phone] Encounter Discharge Information Items Discharge Condition: Stable Ambulatory Status: Ambulatory Discharge Destination: Home Transportation: Private Auto Accompanied By: self Schedule Follow-up Appointment: Yes Clinical Summary of Care: Patient Declined Electronic Signature(s) Signed:  01/06/2020 5:42:58 PM By: Carlene Coria RN Entered By: Carlene Coria on 01/04/2020 11:42:15 -------------------------------------------------------------------------------- Lower Extremity Assessment Details Patient Name: Date of Service: [name], [name] 01/04/2020 10:00 A M Medical Record Number: [id number] Patient Account Number: [id number] Date of Birth/Sex: Treating RN: 17-Nov-1950 (69 y.o. [name] Primary Care Stella Bortle: [name] Other Clinician: Referring Sallie Maker: Treating Mertis Mosher/Extender: Doyle Askew, FRED Weeks in Treatment: 126 Edema Assessment Assessed: [Left: No] [Right: No] Edema: [Left: Yes] [Right: Yes] Calf Left: Right: Point of Measurement: 37 cm From Medial Instep 43 cm 37 cm Ankle Left: Right: Point of  Measurement: 10 cm From Medial Instep 29.8 cm 23.5 cm Vascular Assessment Pulses: Dorsalis Pedis Palpable: [Left:Yes] [Right:Yes] Electronic Signature(s) Signed: 01/05/2020 5:02:14 PM By: Levan Hurst RN, BSN Entered By: Levan Hurst on 01/04/2020 10:36:34 -------------------------------------------------------------------------------- [place] Details Patient Name: Date of Service: [name]. 01/04/2020 10:00 A M Medical Record Number: [id number] Patient Account Number: [id number] Date of Birth/Sex: Treating RN: [birth year] (69 y.o. [name] Primary Care Lorianne Malbrough: [name] Other Clinician: Referring Lennix Kneisel: Treating [name]/Extender: Doyle Askew, FRED Weeks in Treatment: [phone] Active Inactive Venous Leg Ulcer Nursing Diagnoses: Actual venous Insuffiency (use after diagnosis is confirmed) Knowledge deficit related to disease process and management Goals: Patient will maintain optimal edema control Date Initiated: 08/12/2017 Target Resolution Date: 01/18/2020 Goal Status: Active Patient/caregiver will verbalize understanding of disease process and disease management Date  Initiated: 08/12/2017 Date Inactivated: 04/21/2018 Target Resolution Date: 04/24/2018 Goal Status: Met Interventions: Assess peripheral edema status every visit. Compression as ordered Treatment Activities: Therapeutic compression applied : 08/12/2017 Notes: Wound/Skin Impairment Nursing Diagnoses: Impaired tissue integrity Knowledge deficit related to ulceration/compromised skin integrity Goals: Patient/caregiver will verbalize understanding of skin care regimen Date Initiated: 08/12/2017 Target Resolution Date: 01/18/2020 Goal Status: Active Ulcer/skin breakdown will have a volume reduction of 30% by week 4 Date Initiated: 08/05/2017 Date Inactivated: 09/30/2017 Target Resolution Date: 10/03/2017 Goal Status: Met Ulcer/skin breakdown will have a volume reduction of 50% by week 8 Date Initiated: 09/30/2017 Date Inactivated: 10/28/2017 Target Resolution Date: 10/28/2017 Goal Status: Met Interventions: Assess patient/caregiver ability to perform ulcer/skin care regimen upon admission and as needed Assess ulceration(s) every visit Provide education on ulcer and skin care Screen for HBO Treatment Activities: Patient referred to home care : 08/05/2017 Skin care regimen initiated : 08/05/2017 Topical wound management initiated : 08/05/2017 Notes: Electronic Signature(s) Signed: 01/05/2020 4:56:58 PM By: [name] RN, BSN Entered By: [name] on 01/04/2020 10:49:00 -------------------------------------------------------------------------------- Pain Assessment Details Patient Name: Date of Service: [name]. 01/04/2020 10:00 A M Medical Record Number: [id number] Patient Account Number: [id number] Date of Birth/Sex: Treating RN: [birth year] (69 y.o. [name] Primary Care Danett Palazzo: [name] Other Clinician: Referring Verita Kuroda: Treating Dellene Mcgroarty/Extender: Doyle Askew, FRED Weeks in Treatment: [phone] Active Problems Location of Pain Severity and  Description of Pain Patient Has Paino Yes Site Locations Pain Location: Pain in Ulcers With Dressing Change: Yes Duration of the Pain. Constant / Intermittento Intermittent Rate the pain. Current Pain Level: 3 Character of Pain Describe the Pain: Burning Pain Management and Medication Current Pain Management: Medication: Yes Cold Application: No Rest: No Massage: No Activity: No T.E.N.S.: No Heat Application: No Leg drop or elevation: No Is the Current Pain Management Adequate: Adequate How does your wound impact your activities of daily livingo Sleep: No Bathing: No Appetite: No Relationship With Others: No Bladder Continence: No Emotions: No Bowel Continence: No Work: No Toileting: No Drive: No Dressing: No Hobbies: No Engineer, maintenance) Signed: 01/05/2020 5:02:14 PM By: Levan Hurst RN, BSN Entered By: Levan Hurst on 01/04/2020 10:20:34 -------------------------------------------------------------------------------- Patient/Caregiver Education Details Patient Name: Date of Service: [name], [name] 7/21/2021andnbsp10:00 [place] Record Number: [id number] Patient Account Number: [id number] Date of Birth/Gender: Treating RN: [birth year] (69 y.o. [name] Primary Care Physician: [name] Other Clinician: Referring Physician: Treating Physician/Extender: Doyle Askew, FRED Weeks in Treatment: [phone] Education Assessment Education Provided To: Patient Education Topics Provided Venous: Methods: Explain/Verbal Responses: Reinforcements needed, State content correctly Wound/Skin Impairment: Methods: Explain/Verbal Responses: Reinforcements needed, State content  correctly Electronic Signature(s) Signed: 01/05/2020 4:56:58 PM By: [name] RN, BSN Entered By: [name] on 01/04/2020 10:49:32 -------------------------------------------------------------------------------- Wound Assessment Details Patient Name: Date  of Service: [name]. 01/04/2020 10:00 A M Medical Record Number: [id number] Patient Account Number: [id number] Date of Birth/Sex: Treating RN: 10-13-1950 (69 y.o. [name] Primary Care Ronnica Dreese: [name] Other Clinician: Referring Jaimeson Gopal: Treating Nieves Chapa/Extender: Doyle Askew, FRED Weeks in Treatment: 126 Wound Status Wound Number: 61 Primary Venous Leg Ulcer Etiology: Wound Location: Left, Circumferential Lower Leg Wound Open Wounding Event: Gradually Appeared Status: Date Acquired: 04/20/2019 Date Acquired: 04/20/2019 Comorbid Asthma, Hypertension, Peripheral Arterial Disease, Peripheral Weeks Of Treatment: 37 History: Venous Disease, Type II Diabetes, Gout, Osteoarthritis Clustered Wound: Yes Photos Photo Uploaded By: Mikeal Hawthorne on 01/04/2020 13:05:15 Wound Measurements Length: (cm) 12.5 Width: (cm) 28 Depth: (cm) 0.1 Clustered Quantity: 3 Area: (cm) 274.889 Volume: (cm) 27.489 % Reduction in Area: -3545.7% % Reduction in Volume: -3545.8% Epithelialization: Small (1-33%) Tunneling: No Undermining: No Wound Description Classification: Full Thickness Without Exposed Support Str Wound Margin: Flat and Intact Exudate Amount: Large Exudate Type: Serous Exudate Color: amber uctures Foul Odor After Cleansing: No Slough/Fibrino Yes Wound Bed Granulation Amount: Medium (34-66%) Exposed Structure Granulation Quality: Pink, Pale Fascia Exposed: No Necrotic Amount: Medium (34-66%) Fat Layer (Subcutaneous Tissue) Exposed: Yes Necrotic Quality: Adherent Slough Tendon Exposed: No Muscle Exposed: No Joint Exposed: No Bone Exposed: No Treatment Notes Wound #61 (Left, Circumferential Lower Leg) 1. Cleanse With Wound Cleanser Soap and water 2. Periwound Care Barrier cream 3. Primary Dressing Applied Calcium Alginate Ag 4. Secondary Dressing ABD Pad Dry Gauze 6. Support Layer Applied 3 layer compression  wrap Notes netting Electronic Signature(s) Signed: 01/05/2020 5:02:14 PM By: Levan Hurst RN, BSN Entered By: Levan Hurst on 01/04/2020 10:38:14 -------------------------------------------------------------------------------- Wound Assessment Details Patient Name: Date of Service: [name]. 01/04/2020 10:00 A M Medical Record Number: [id number] Patient Account Number: [id number] Date of Birth/Sex: Treating RN: January 19, 1951 (69 y.o. [name] Primary Care Kevis Qu: [name] Other Clinician: Referring Daison Braxton: Treating Margaruite Top/Extender: Doyle Askew, FRED Weeks in Treatment: 126 Wound Status Wound Number: 64 Primary Diabetic Wound/Ulcer of the Lower Extremity Etiology: Wound Location: Left, Dorsal Foot Wound Open Wounding Event: Gradually Appeared Status: Date Acquired: 06/01/2019 Comorbid Asthma, Hypertension, Peripheral Arterial Disease, Peripheral Weeks Of Treatment: 31 History: Venous Disease, Type II Diabetes, Gout, Osteoarthritis Clustered Wound: No Photos Photo Uploaded By: Mikeal Hawthorne on 01/04/2020 13:05:02 Wound Measurements Length: (cm) 7 % Redu Width: (cm) 7.5 % Redu Depth: (cm) 0.1 Epithe Area: (cm) 41.233 [place] Volume: (cm) 4.123 Under ction in Area: -24889.7% ction in Volume: -8314.3% lialization: Small (1-33%) ling: No mining: No Wound Description Classification: Grade 1 Foul Wound Margin: Flat and Intact Sloug Exudate Amount: Medium Exudate Type: Serous Exudate Color: amber Odor After Cleansing: No h/Fibrino Yes Wound Bed Granulation Amount: Large (67-100%) Exposed Structure Granulation Quality: Pink, Pale Fascia Exposed: No Necrotic Amount: Small (1-33%) Fat Layer (Subcutaneous Tissue) Exposed: Yes Necrotic Quality: Adherent Slough Tendon Exposed: No Muscle Exposed: No Joint Exposed: No Bone Exposed: No Treatment Notes Wound #64 (Left, Dorsal Foot) 1. Cleanse With Wound Cleanser Soap and water 2.  Periwound Care Barrier cream 3. Primary Dressing Applied Calcium Alginate Ag 4. Secondary Dressing ABD Pad Dry Gauze 6. Support Layer Applied 3 layer compression wrap Notes netting Electronic Signature(s) Signed: 01/05/2020 5:02:14 PM By: Levan Hurst RN, BSN Entered By: Levan Hurst on 01/04/2020 10:38:40 -------------------------------------------------------------------------------- Wound Assessment Details Patient Name: Date of  Service: [name]. 01/04/2020 10:00 A M Medical Record Number: [id number] Patient Account Number: [id number] Date of Birth/Sex: Treating RN: [birth year] (69 y.o. [name] Primary Care Keiffer Piper: [name] Other Clinician: Referring Randall Rampersad: Treating Jette Lewan/Extender: Doyle Askew, FRED Weeks in Treatment: 126 Wound Status Wound Number: 65 Primary Venous Leg Ulcer Etiology: Wound Location: Right, Medial Lower Leg Wound Open Wounding Event: Gradually Appeared Status: Date Acquired: 09/28/2019 Comorbid Asthma, Hypertension, Peripheral Arterial Disease, Peripheral Weeks Of Treatment: 14 History: Venous Disease, Type II Diabetes, Gout, Osteoarthritis Clustered Wound: No Photos Photo Uploaded By: Mikeal Hawthorne on 01/04/2020 13:05:28 Wound Measurements Length: (cm) 0.9 Width: (cm) 1 Depth: (cm) 0.1 Area: (cm) 0.707 Volume: (cm) 0.071 % Reduction in Area: 35.7% % Reduction in Volume: 35.5% Epithelialization: Medium (34-66%) Tunneling: No Undermining: No Wound Description Classification: Full Thickness Without Exposed Support Structures Wound Margin: Distinct, outline attached Exudate Amount: Small Exudate Type: Serosanguineous Exudate Color: red, brown Foul Odor After Cleansing: No Slough/Fibrino No Wound Bed Granulation Amount: Large (67-100%) Exposed Structure Granulation Quality: Pink Fascia Exposed: No Necrotic Amount: None Present (0%) Fat Layer (Subcutaneous Tissue) Exposed: Yes Tendon Exposed:  No Muscle Exposed: No Joint Exposed: No Bone Exposed: No Treatment Notes Wound #65 (Right, Medial Lower Leg) 1. Cleanse With Wound Cleanser Soap and water 2. Periwound Care Barrier cream 3. Primary Dressing Applied Calcium Alginate Ag 4. Secondary Dressing ABD Pad Dry Gauze 6. Support Layer Applied 3 layer compression wrap Notes netting Electronic Signature(s) Signed: 01/05/2020 5:02:14 PM By: Levan Hurst RN, BSN Entered By: Levan Hurst on 01/04/2020 10:39:00 -------------------------------------------------------------------------------- Vitals Details Patient Name: Date of Service: [name]. 01/04/2020 10:00 A M Medical Record Number: [id number] Patient Account Number: [id number] Date of Birth/Sex: Treating RN: January 28, 1951 (69 y.o. [name] Primary Care Riggins Cisek: [name] Other Clinician: Referring Marcas Bowsher: Treating Nehemias Sauceda/Extender: Doyle Askew, FRED Weeks in Treatment: 126 Vital Signs Time Taken: 10:19 Temperature (F): 98.4 Height (in): 62 Pulse (bpm): 106 Weight (lbs): 335 Respiratory Rate (breaths/min): 18 Body Mass Index (BMI): 61.3 Blood Pressure (mmHg): 164/81 Capillary Blood Glucose (mg/dl): 161 Reference Range: 80 - 120 mg / dl Notes glucose per pt report Electronic Signature(s) Signed: 01/05/2020 5:02:14 PM By: Levan Hurst RN, BSN Entered By: Levan Hurst on 01/04/2020 10:20:17

## 2020-01-18 ENCOUNTER — Encounter (HOSPITAL_BASED_OUTPATIENT_CLINIC_OR_DEPARTMENT_OTHER): Payer: Medicare PPO | Attending: Physician Assistant | Admitting: Physician Assistant

## 2020-01-18 DIAGNOSIS — E11622 Type 2 diabetes mellitus with other skin ulcer: Secondary | ICD-10-CM | POA: Insufficient documentation

## 2020-01-18 DIAGNOSIS — R531 Weakness: Secondary | ICD-10-CM | POA: Diagnosis not present

## 2020-01-18 DIAGNOSIS — I890 Lymphedema, not elsewhere classified: Secondary | ICD-10-CM | POA: Insufficient documentation

## 2020-01-18 DIAGNOSIS — F418 Other specified anxiety disorders: Secondary | ICD-10-CM | POA: Insufficient documentation

## 2020-01-18 DIAGNOSIS — I10 Essential (primary) hypertension: Secondary | ICD-10-CM | POA: Diagnosis not present

## 2020-01-18 DIAGNOSIS — L97522 Non-pressure chronic ulcer of other part of left foot with fat layer exposed: Secondary | ICD-10-CM | POA: Diagnosis not present

## 2020-01-18 DIAGNOSIS — I87333 Chronic venous hypertension (idiopathic) with ulcer and inflammation of bilateral lower extremity: Secondary | ICD-10-CM | POA: Insufficient documentation

## 2020-01-18 DIAGNOSIS — L97822 Non-pressure chronic ulcer of other part of left lower leg with fat layer exposed: Secondary | ICD-10-CM | POA: Diagnosis not present

## 2020-01-18 DIAGNOSIS — L97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 leg with fat layer exposed: Secondary | ICD-10-CM | POA: Diagnosis present

## 2020-01-18 NOTE — Progress Notes (Addendum)
[name] ([id number]) Visit Report for 01/18/2020 Chief Complaint Document Details Patient Name: Date of Service: [name], [name] 01/18/2020 10:00 A M Medical Record Number: [id number] Patient Account Number: [id number] Date of Birth/Sex: Treating RN: [birth year] (69 y.o. [name] Primary Care Provider: Dustin [name] Other Clinician: Referring Provider: Treating Provider/Extender: [name] in Treatment: 128 Information Obtained from: Patient Chief Complaint Bilateral reoccurring LE ulcers Electronic Signature(s) Signed: 01/18/2020 10:42:55 AM By: [name] PA-C Entered By: [name] on 01/18/2020 10:42:54 -------------------------------------------------------------------------------- HPI Details Patient Name: Date of Service: [name]. 01/18/2020 10:00 A M Medical Record Number: [id number] Patient Account Number: [id number] Date of Birth/Sex: Treating RN: 07/21/1950 (69 y.o. [name] Primary Care Provider: Dustin [name] Other Clinician: Referring Provider: Treating Provider/Extender: [name] in Treatment: 128 History of Present Illness HPI Description: this patient has been seen a couple of times before and returns with recurrent problems to her right and left lower extremity with swelling and weeping ulcerations due to not wearing her compression stockings which she had been advised to do during her last discharge, at the end of June 2018. During her last visit the patient had had normal arterial blood flow and her venous reflux study did not necessitate any surgical intervention. She was recommended compression and elevation and wound care. After prolonged treatment the patient was completely healed but she has been noncompliant with wearing or compressions.. She was here last week with an outpatient return visit planned but the patient came in a very poor general condition with altered  mental status and was rushed to the ER on my request. With a history of hypertension, diabetes, TIA and right-sided weakness she was set up for an MRI on her brain and cervical spine and was sent to [facility]. Getting an MRI done was very difficult but once the workup was done she was found not to have any spinal stenosis, epidural abscess or hematoma or discitis. This was radiculopathy to be treated as an outpatient and she was given a follow-up appointment. Today she is feeling much better alert and oriented and has come to reevaluate her bilateral lower extremity lymphedema and ulceration 03/25/2017 -- she was admitted to the hospital on 03/16/2017 and discharged on 03/18/2017 with left leg cellulitis and ulceration. She was started on vancomycin and Zosyn and x-ray showed no bony involvement. She was treated for a cellulitis with IV antibiotics changed to Rocephin and Flagyl and was discharged on oral Keflex and doxycycline to complete a 7 day course. Last hemoglobin A1c was 7.1 and her other ailments including hypertension got asthma were appropriately treated. 05/06/2017 -- she is awaiting the right size of compression stockings from [place] but other than that has been doing well. ====== Old notes 69 year old patient was seen one time last October and was lost to follow-up. She has recurrent problems with weeping and ulceration of her left lower extremity and has swelling of this for several years. It has been worse for the last 2 months. Past medical history is significant for diabetes mellitus type 2, hypertension, gout, morbid obesity, depressive disorders, hiatal hernia, migraines, status post knee surgery, risk of a cholecystectomy, vaginal hysterectomy and breast biopsy. She is not a smoker. As noted before she has never had a venous duplex study and an arterial ABI study was attempted but the left lower extremity was noncompressible 10/01/2016 -- had a lower extremity venous duplex  reflux evaluation which showed  no evidence of deep vein reflux in the right or left lower extremity, and no evidence of great saphenous vein reflux more than 500 ms in the right or left lower extremity, and the left small saphenous vein is incompetent but no vascular consult was recommended. review of her electronic medical records noted that the ABI was checked in July 2017 where the right ABI was normal limits and the left ABI could not be ascertained due to pain with cuff pressure but the waveforms are within normal limits. her arterial duplex study scheduled for April 27. 10/08/2016 -- the patient has various reasons for not having a compression on and for the last 3 days she has had no compression on her left lower extremity either due to pain or the lack of nursing help. She does not use her juxta lites either. 10/15/2016 -- the patient did not keep her appointment for arterial duplex study on April 27 and I have asked her to reschedule this. Her pain is out of proportion with the physical findings and she continuously fails to wear a compression wraps and cuts them off because she says she cannot tolerate the pain. She does not use her juxta lites either. 10/22/2016 -- he has rescheduled her arterial duplex study to May 21 and her pain today is a bit better. She has not been wearing her juxta lites on her right lower extremity but now understands that she needs to do this. She did tolerate the to press compression wrap on her left lower extremity 10/29/2016 --arterial duplex study is scheduled for next week and overall she has been tolerating her compression wraps and also using her juxta lites on her right lower extremity 11/05/2016 -- the right ABI was 0.95 the left was 1.03. The digit TBI is on the right was 0.83 on the left was 0.92 and she had biphasic flow through these vessels. The impression was that of normal lower extremity arterial study. 11/12/2016 -- her pain is minimal and she  is doing very well overall. 11/26/2016 -- she has got juxta lites and her insurance will not pay for additional dual layer compression stockings. She is going to order some from [place]. 05/12/2017 -- her juxta lites are very old and too big for her and these have not been helping with compression. She did get 20-30 mm compression stockings from [place] but she and her husband are unable to put these on. I believe she will benefit from bilateral Extremit-ease, compression stockings and we will measure her for these today. 05/20/2017 -- lymphedema on the left lower extremity has increased a lot and she has a open ulceration as a result of this. The right lower extremity is looking pretty good. She has decided to by the compression stockings herself and will get reimbursed by the home health, at a later date. 05/27/2017 -- her sciatica is bothering her a lot and she thought her left leg pain was caused due to the compression wrap and hence removed it and has significant lymphedema. There is no inflammation on this left lower extremity. 06/17/17 on evaluation today patient appears to be doing very well and in fact is completely healed in regard to her ulcerations. Unfortunately however she does have continued issues with lymphedema nonetheless. We did order compression garments for her unfortunately she states that the size that she received were large although we ordered medium. Obviously this means she is not getting the optimal compression. She does not have those with her today and therefore  we could not confirm and contact the company on her behalf. Nonetheless she does state that she is going to have her husband bring them by tomorrow so that we can verify and then get in touch with the company. No fevers, chills, nausea, or vomiting noted at this time. Overall patient is doing better otherwise and I'm pleased with the progress she has made. 07/01/17 on evaluation today patient appears to be doing  very well in regard to her bilateral lower extremity she does not have any openings at this point which is excellent news. Overall I'm pleased with how things have progressed up to this time. Since she is doing so well we did order her compression which we are seeing her today to ensure that it fits her properly and everything is doing well in that regard and then subsequently she will be discharged. ============ Old Notes: 03/31/16 patient presents today for evaluation concerning open wounds that she has over the left medial ankle region as well as the left dorsal foot. She has previously had this occur although it has been healed for a number of months after having this for about a year prior until her hospitalization on 01/05/16. At that point in time it appears that she was admitted to the hospital for left lower extremity cellulitis and was placed on vancomycin and Zosyn at that point. Eventually upon discharge on January 15, 2016 she was placed on doxycycline at that point in time. Later on 03/27/16 positive wound culture growing Escherichia coli this was switched to amoxicillin. Currently she tells me that she is having pain radiated to be a 7 out of 10 which can be as high as 10 out of 10 with palpation and manipulation of the wound. This wound appears to be mainly venous in nature due to the bilateral lower extremity venous stasis/lymphedema. This is definitely much worse on her left than the right side. She does have type 1 diabetes mellitus, hypertension, morbid obesity, and is wheelchair dependent.during the course of the hospital stay a blood culture was also obtained and fortunately appeared negative. She also had an x-ray of the tibia/fibula on the left which showed no acute bone abnormality. Her white blood cell count which was performed last on 03/25/16 was 7.3, hemoglobin 12.8, protein 7.1, albumin 3.0. Her urine culture appeared to be negative for any specific organisms. Patient did  have a left lower extremity venous duplex evaluation for DVT . This did not include venous reflux studies but fortunately was negative for DVT Patient also had arterial studies performed which revealed that she had a . normal ABI on the right though this was unable to be performed on the left secondary to pain that she was having around the ankle region due to the wound. However it was stated on report that she had biphasic pulses and apparently good blood flow. ========== 06/03/17 she is here in follow-up evaluation for right lower extremity ulcer. The right lower sure he has healed but she has reopened to the left medial malleolus and dorsal foot with weeping. She is waiting for new compression garments to arrive from home health, the previous compression garments were ill fitting. We will continue with compression bilaterally and follow-up in 2 [name] Readmission: 08/05/17 on evaluation today patient appears to be doing somewhat poorly in regard to her left lower extremity especially although the right lower extremity has a small area which may no longer be open. She has been having a lot of drainage from the  left lower extremity however he tells me that she has not been able to use the EXTREMIT-EASE Compression at this point. She states that she did better and was able to actually apply the Juxta-Lite compression although the wound that she has is too large and therefore really does not compress which is why she cannot wear it at this point. She has no one who can help her put it on regular basis her son can sometimes but he's not able to do it most of the time. I do believe that's why she has begun to weave and have issues as she is currently yet again. No fevers, chills, nausea, or vomiting noted at this time. Patient is no evidence of dementia. 08/12/17 on evaluation today patient appears to still be doing fairly well in regard to the draining areas/weeping areas at this point. With that being  said she unfortunately did go to the ER yesterday due to what was felt to be possibly a cellulitis. They place her on doxycycline by mouth and discharge her home. She definitely was not admitted. With that being said she states she has had more discomfort which has been unusual for her even compared to prior times and she's had infections.08/12/17 on evaluation today patient appears to still be doing fairly well in regard to the draining areas/weeping areas at this point. With that being said she unfortunately did go to the ER yesterday due to what was felt to be possibly a cellulitis. They place her on doxycycline by mouth and discharge her home. She definitely was not admitted. With that being said she states she has had more discomfort which has been unusual for her even compared to prior times and she's had infections. 08/19/17 put evaluation today patient tells me that she's been having a lot of what sounds to be neuropathic type pain in regard to her left lower extremity. She has been using over-the-counter topical bins again which some believe. That in order to apply the she actually remove the wrap we put on her last Wednesday on Thursday. Subsequently she has not had anything on compression wise since that time. The good news is a lot of the weeping areas appear to have closed at this point again I believe she would do better with compression but we are struggling to get her to actually use what she needs to at this point. No fevers, chills, nausea, or vomiting noted at this time. 09/03/17 on evaluation today patient appears to be doing okay in regard to her lower extremities in regard to the lymphedema and weeping. Fortunately she does not seem to show any signs of infection at this point she does have a little bit of weeping occurring in the right medial malleolus area. With that being said this does not appear to be too significant which is good news. 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 She has severe skin damage secondary to both of these features involving the dorsal left foot and medial left ankle and lower leg. Still has open areas in the left anterior foot. The area on the right closed over. She uses her own juxta light stockings. She does not have an arterial issue 09/16/17 on evaluation today patient actually appears to be doing excellent in regard to her bilateral lower extremity swelling. The Juxta-Lite compression wrap seem to be doing very well for her. She has not however been using the portion that goes over her foot.  Her left foot still is draining a little bit not nearly as significant as it has been in the past but still I do believe that she likely needs to utilize the full wrap including the foot portion of this will improve as well. She also has been apparently putting on a significant amount of Vaseline which also think is not helpful for her. I recommended that if she feels she needs something for moisturizer Eucerin will probably be better. 09/30/17 on evaluation today patient presents with several new open areas in regard to her left lower extremity although these appear to be minimal and mainly seem to be more moisture breakdown than anything. Fortunately she does not seem to have any evidence of infection which is great news. She has been tolerating the dressing changes without complication we are using silver alginate on the foot she has been using AB pads to have the legs and using her Juxta- Lite compression which seems to be controlling her swelling very well. Overall I'm pleased with the poor way she has progressed. 10/14/17 on evaluation today patient appears to be doing better in regard to her left lower extremity areas of weeping. She does still have some discomfort although in general this does not appear to be as macerated and I think it is progressing nicely. I do think she still  needs to wear the foot portion of her Juxta- Lite in order to get the most benefit from the wrap obviously. She states she understands. Fortunately there does not appear to be evidence of infection at this time which is great news. 10/28/17 on evaluation today patient appears to be doing excellent in regard to her left lower extremity. She has just a couple areas that are still open and seem to be causing any trouble whatsoever. For that reason I think that she is definitely headed in the right direction the spots are very tiny compared to what we have been dealing with in the past. 11/11/17 on evaluation today patient appears to have a right lateral lower extremity ulcer that has opened since I last saw her. She states this is where the home health nurse that was coming out remove the dressing without wetting the alginate first. Nonetheless I do not know if this is indeed the case or not but more importantly we have not ordered home help to be coming out for her wounds at all. I'm unsure as to why they are coming out and we're gonna have to check on this and get things situated in that regard. With that being said we currently really do not need them to be coming out as the patient has been taking care of her leg herself without complication and no issues. In fact she was doing much better prior to nursing coming out. 11/25/17 on evaluation today patient actually appears to be doing fairly well in regard to her left lower extremity swelling. In fact she has very little area of weeping at this point there's just a small spot on the lateral portion of her right leg that still has me just a little bit more concerned as far as wanting to see this clear up before I discharge her to caring for this at home. Nonetheless overall she has made excellent progress. 12/09/17 on evaluation today patient appears to be doing rather well in regard to her lower extremity edema. She does have some weeping still in the left  lower extremity although the big area we were taking care of two  [name] ago actually has closed and she has another area of weeping on the left lower extremity immediately as well is the top of her foot. She does not currently have lymphedema pumps she has been wearing her compression daily on a regular basis as directed. With that being said I think she may benefit from lymphedema pumps. 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 We had a very difficult time getting and keeping this under control. 12/23/17 on evaluation today patient actually appears to be doing a little bit more poorly in regard to her bilateral lower extremities. She has been tolerating the Juxta-Lite compression wraps. Unfortunately she has two new ulcers on the right lower extremity and left lower Trinity ulceration seems to be larger. Obviously this is not good news. She has been tolerating the dressings without complication. 12/30/17 on evaluation today patient actually appears to be doing much better in regard to her bilateral lower extremity edema. She continues to have some issues with ulcerations and in fact there appears to be one spot on each leg where the wrap may have caused a little bit of a blister which is subsequently opened up at this point is given her pain. Fortunately it does not appear to be any evidence of infection which is good news. No fevers chills noted. 01/13/18 on evaluation today patient appears to be doing rather well in regard to her bilateral lower extremities. The dressings did get kind of stuck as far as the wound beds are concerned but again I think this is mainly due to the fact that she actually seems to be showing signs of healing which is good news. She's not having as much drainage therefore she was having more of the dressing sticking.  Nonetheless overall I feel like her swelling is dramatically down compared to previous. 01/20/18 on evaluation today patient unfortunately though she's doing better in most regards has a large blister on the left anterior lower extremity where she is draining quite significantly. Subsequently this is going to need debridement today in order to see what's underneath and ensure she does not continue to trapping fluid at this location. Nonetheless No fevers, chills, nausea, or vomiting noted at this time. 01/27/18 on evaluation today patient appears to be doing rather well at this point in regard to her right lower extremity there's just a very small area that she still has open at this point. With that being said I do believe that she is tolerating the compression wraps very well in making good progress. Home health is coming out at this point to see her. Her left lower extremity on the lateral portion is actually what still mainly open and causing her some discomfort for the most part 02/10/18 on evaluation today patient actually appears to be doing very well in regard to her right lower extremity were all the ulcers appear to be completely close. In regard to the left lower extremity she does have two areas still open and some leaking from the dorsal surface of her foot but this still seems to be doing much better to me in general. 02/24/18 on evaluation today patient actually appears to be doing much better in regard to her right lower extremity this is still completely healed. Her left lower extremity is also doing much better fortunately she has no evidence of infection. The one  area that is gonna require some debridement is still on the left anterior shin. Fortunately this is not hurting her as badly today. 03/10/18 on evaluation today patient appears to be doing better in some regards although she has a little bit more open area on the dorsal foot and she also has some issues on the medial portion of  the left lower extremity which is actually new and somewhat deep. With that being said there fortunately does not appear to be any significant signs of infection which is good news. No fevers, chills, nausea, or vomiting noted at this time. In general her swelling seems to be doing fairly well which is good news. 03/31/18 on evaluation today patient presents for follow-up concerning her left lower extremity lymphedema. Unfortunately she has been doing a little bit more poorly since I last saw her in regard to the amount of weeping that she is experiencing. She's also having some increased pain in the anterior shin location. Unfortunately I do not feel like the patient is making such good progress at this point a few [name] back she was definitely doing much better. 04/07/18 on evaluation today patient actually appears to be showing some signs of improvement as far as the left lower extremity is concerned. She has been tolerating the dressing changes and it does appear that the Drawtex did better for her. With that being said unfortunately home health is stating that they cannot obtain the Drawtex going forward. Nonetheless we're gonna have to check and see what they may be able to get the alginate they were using was getting stuck in causing new areas of skin being pulled all that with and subsequently weep and calls her to worsen overall this is the first time we've seen improvement at this time. 04/14/18 on evaluation today patient actually appears to be doing rather well at this point there does not appear to be any evidence of infection at this time and she is actually doing excellent in regard to the weeping in fact she almost has no openings remaining even compared to just last week this is a dramatic improvement. No fevers chills noted 04/21/18 evaluation today patient actually appears to be doing very well. She in fact is has a small area on the posterior lower extremity location and she has  a small area on the dorsal surface of her foot that are still open both of which are very close to closing. We're hoping this will be close shortly. She brought her Juxta-Lite wrap with her today hoping that would be able to put her in it unfortunately I don't think were quite at that point yet but we're getting closer. 04/28/18 upon evaluation today patient actually appears to be doing excellent in regard to her left lower extremity ulcer. In fact the region on the posterior lower extremity actually is much smaller than previously noted. Overall I'm very happy with the progress she has made. She again did bring her Juxta-Lite although we're not quite ready for that yet. 05/11/18 upon evaluation today patient actually appears to be doing in general fairly well in regard to her left lower Trinity. The swelling is very well controlled. With that being said she has a new area on the left anterior lower extremity as well as between the first and second toes of her left foot that was not present during the last evaluation. The region of her posterior left lower extremity actually appears to be almost completely healed. T be honest I'm very pleased  with o the way that stands. Nonetheless I do believe that the lotion may be keeping the area to moist as far as her legs are concerned subsequently I'm gonna consider discontinuing that today. 05/26/18 on evaluation today patient appears to be doing rather well in regard to her left lower should be ulcers. In fact everything appears to be close except for a very small area on the left posterior lower extremity. Fortunately there does not appear to be any evidence of infection at this time. Overall very pleased with her progress. 06/02/18 and evaluation today patient actually appears to be doing very well in regard to her lower extremity ulcers. She has one small area that still continues to weep that I think may benefit her being able to justify lotion and user  Juxta-Lite wraps versus continued to wrap her. Nonetheless I think this is something we can definitely look into at this point. 06/23/18 on evaluation today patient unfortunately has openings of her bilateral lower extremities. In general she seems to be doing much worse than when I last saw her just as far as her overall health standpoint is concerned. She states that her discomfort is mainly due to neuropathy she's not having any other issues otherwise. No fevers, chills, nausea, or vomiting noted at this time. 06/30/18 on evaluation today patient actually appears to be doing a little worse in regard to her right lower extremity her left lower extremity of doing fairly well. Fortunately there is no sign of infection at this time. She has been tolerating the dressing changes without complication. Home health did not come out like they were supposed to for the appropriate wrap changes. They stated that they never received the orders from [place] which were fax. Nonetheless we will send a copy of the orders with the patient today as well. 07/07/18 on evaluation today patient appears to be doing much better in regard to lower extremities. She still has several openings bilaterally although since I last saw her her legs did show obvious signs of infection when she later saw her nurse. Subsequently a culture was obtained and she is been placed on Bactrim and Keflex. Fortunately things seem to be looking much better it does appear she likely had an infection. Again last week we'd even discussed it but again there really was not any obvious sign that she had infection therefore we held off on the antibiotics. Nonetheless I'm glad she's doing better today. 07/14/18 on evaluation today patient appears to be doing much better regarding her bilateral lower Trinity's. In fact on the right lower for me there's nothing open at this point there are some dry skin areas at the sites where she had infection. Fortunately there is  no evidence of systemic infection which is excellent news. No fevers chills noted 07/21/18 on evaluation today patient actually appears to be doing much better in regard to her left lower extremity ulcers. She is making good progress and overall I feel like she's improving each time I see her. She's having no pain I do feel like the infection is completely resolved which is excellent news. No fevers, chills, nausea, or vomiting noted at this time. 07/28/18 on evaluation today patient appears to be doing very well in regard to her left lower [employer]. Everything seems to be showing signs of improvement which is excellent news. Overall very pleased with the progress that has been made. Fortunately there's no evidence of active infection at this time also excellent news. 08/04/18 on evaluation  today patient appears to be doing more poorly in regard to her bilateral lower extremities. She has two new areas open up on the right and these were completely closed as of last week. She still has the two spots on the left which in my pinion seem to be doing better. Fortunately there's no evidence of infection again at this point. 08/11/18 on evaluation today patient actually appears to be doing very well in regard to her bilateral lower Trinity wounds that all seem to be doing better and are measures smaller today. Fortunately there's no signs of infection. No fevers, chills, nausea, or vomiting noted at this time. 08/18/18 on evaluation today patient actually appears to be doing about the same inverter bilateral lower extremities. She continues to have areas that blistering open as was drain that fortunately nothing too significant. Overall I feel like Drawtex may have done better for her however compared to the collagen. 08/25/18 on evaluation today patient appears to be doing a little bit more poorly today even compared to last time I saw her. Again I'm not exactly sure why she's making worse progress over  the past several [name]. I'm beginning to wonder if there is some kind of underlying low level infection causing this issue. I did actually take a culture from the left anterior lower extremity but it was a new wound draining quite a bit at this point. Unfortunately she also seems to be having more pain which is what also makes me worried about the possibility of infection. This is despite never erythema noted at this point. 09/01/18 on evaluation today patient actually appears to be doing a little worse even compared to last week in regard to bilateral lower extremities. She did go to the hospital on the 16th was given a dose of IV Zosyn and then discharged with a recommendation to continue with the Bactrim that I previously prescribed for her. Nonetheless she is still having a lot of discomfort she tells me as well at this time. This is definitely unfortunate. No fevers, chills, nausea, or vomiting noted at this time. 09/08/18 on evaluation today patient's bilateral lower extremities actually appear to be shown signs of improvement which is good news. Fortunately there does not appear to be any signs of active infection I think the anabiotic is helping in this regard. Overall I'm very pleased with how she is progressing. 09/15/18 patient was actually seen in ER yesterday due to her legs as well unfortunately. She states she's been having a lot of pain and discomfort as well as a lot of drainage. Upon inspection today the patient does have a lot of swelling and drainage I feel like this is more related to lymphedema and poor fluid control than it is to infection based on what I'm seeing. The physician in the emergency department also doubted that the patient was having a significant infection nonetheless I see no evidence of infection obvious at this point although I do see evidence of poor fluid control. She still not using a compression pumps, she is not elevating due to her lift chair as well as her  hospital bed being broken, and she really is not keeping her legs up as much as they should be and also has been taking off her wraps. All this combined I think has led to poor fluid control and to be honest she may be somewhat volume overloaded in general as well. I recommend that she may need to contact your physician to see if a  prescription for a diuretic would be beneficial in their opinion. As long as this is safe I think it would likely help her. 09/29/18 on evaluation today patient's left lower extremity actually appears to be doing quite a bit better. At least compared to last time that I saw her. She still has a large area where she is draining from but there's a lot of new skin speckled trout and in fact there's more new skin that there are open areas of weeping and drainage at this point. This is good news. With regard to the right lower extremity this is doing much better with the only open area that I really see being a dry spot on the right lateral ankle currently. Fortunately there's no signs of active infection at this time which is good news. No fevers, chills, nausea, or vomiting noted at this time. The patient seems somewhat stressed and overwhelmed during the visit today she was very lethargic as such. She does and she is not taking any pain medications at this point. Apparently according to her husband are also in the process of moving which is probably taking its toll on her as well. 10/06/18 on evaluation today patient appears to be doing rather well in regard to her lower extremities compared to last evaluation. Fortunately there's no signs of active infection. She tells me she did have an appointment with her primary. Nonetheless he was concerned that the wounds were somewhat deep based on pictures but we never actually saw her legs. She states that he had her somewhat worried due to the fact that she was fearing now that she was [place] have to have an amputation. With that being  said based on what I'm seeing check she looks better this week that she has the last two times I've seen her with much less drainage I'm actually pleased in this regard. That doesn't mean that she's out of the water but again I do not think what the point of talking about education at all in regard to her leg. She is very happy to hear this. She is also not having as much pain as she was having last week. 10/13/18 unfortunately on evaluation today patient still continues to have a significant amount of drainage she's not letting home health actually apply the compression dressings at this point. She's trying to use of Juxta-Lite of the top of Kerlex and the second layer of the three layer compression wrap. With that being said she just does not seem to be making as good a progress as I would expect if she was having the compression applied and in place on a regular basis. No fevers, chills, nausea, or vomiting noted at this time. 10/20/18 on evaluation today patient appears to be doing a little better in regard to her bilateral lower extremity ulcers. In fact the right lower extremity seems to be healed she doesn't even have any openings at this point left lower extremity though still somewhat macerated seems to be showing signs of new skin growth at multiple locations throughout. Fortunately there's no evidence of active infection at this time. No fevers, chills, nausea, or vomiting noted at this time. 10/27/18 on evaluation today patient appears to be doing much better in regard to her left lower Trinity ulcer. She's been tolerating the laptop complication and has minimal drainage noted at this point. Fortunately there's no signs of active infection at this time. No fevers, chills, nausea, or vomiting noted at this time. 11/03/18 on evaluation today  patient actually appears to be doing excellent in regard to her left lower extremity. She is having very little drainage at this point there does not appear  to be any significant signs of infection overall very pleased with how things have gone. She is likewise extremely pleased still and seems to be making wonderful progress week to week. I do believe antibiotics were helpful for her. Her primary care provider did place on amateur clean since I last saw her. 11/17/18 on evaluation today patient appears to be doing worse in regard to her bilateral lower extremities at this point. She is been tolerating the dressing changes without complication. With that being said she typically takes the Coban off fairly quickly upon arriving home even after being seen here in the clinic and does not allow home health reapply command as part of the dressing at home. Therefore she said no compression essentially since I last saw her as best I can tell. With that being said I think it shows and how much swelling she has in the open wounds that are noted at this point. Fortunately there's no signs of infection but unfortunately if she doesn't get this under control I think she will end up with infection and more significant issues. 11/24/18 on evaluation today patient actually appears to be doing somewhat better in regard to her bilateral lower extremities. 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 Nonetheless that is doing better currently so she should be able to attempt reinitiating the lymphedema pumps at this time. No fevers, chills, nausea, or vomiting noted at this time. 12/01/18 upon evaluation today patient's left lower extremity appears to be doing quite well unfortunately her right lower extremity is not doing nearly as well. She has been tolerating the dressing changes without complication unfortunately she did not keep a wrap on the right at this time. Nonetheless I believe this has led to increased swelling and weeping in  the world is actually much larger than during the last evaluation with her. 12/08/18 on evaluation today patient appears to be doing about the same at this point in regard to her right lower extremity. There is some more palatable to touch I'm concerned about the possibility of there being some infection although I think the main issue is she's not keeping her compression wrap on which in turn is not allowing this area to heal appropriately. 12/22/18 on evaluation today patient appears to be doing better in regard to left lower extremity unfortunately significantly worse in regard to the right lower extremity. The areas of blistering and necrotic superficial tissue have spread and again this does not really appear to be signs of infection and all she just doesn't seem to be doing nearly as well is what she has been in the past. Overall I feel like the Augmentin did absolutely nothing for her she doesn't seem to have any infection again I really didn't think so last time either is more of a potential preventative measure and hoping that this would make some difference but I think the main issue is she's not wearing her compression. She tells me she cannot wear the [place] been we put on she takes it off pretty much upon getting home. Subsequently she worshiped Juxta-Lite when I questioned her about how often she wears it this is no  more than three hours a day obviously that leaves 21 hours that she has no compression and this is obviously not doing well for her. Overall I'm concerned that if things continue to worsen she is at great risk of both infection as well as losing her leg. 01/05/19 on evaluation today patient appears to be doing well in regard to her left lower extremity which he is allowing [place] to wrap and not so well with regard to her right lower extremity which she is not allowing [place] to really wrap and keep the wrap on. She states that it hurts too badly whenever it's wrapped and she ends up  having to take it off. She's been using the Juxta-Lite she tells me up to six hours a day although I question whether or not that's really been the case to be honest. Previously she told me three hours today nonetheless obviously the legs as long as the wrap is doing great when she is not is doing much more poorly. 01/12/2019 on evaluation today patient actually appears to be doing a little better in my opinion with regard to her right lower extremity ulcer. She has a small open area on the left lower extremity unfortunately but again this I think is part of the normal fluctuation of what she is going to have to expect with regard to her legs especially when she is not using her lymphedema pumps on a regular basis. Subsequently based on what I am seeing today I think that she does seem to be doing slightly better with regard to her right lower extremity she did see her primary care provider on Monday they felt she had an infection and placed her on 2 antibiotics. Both Cipro and clindamycin. Subsequently again she seems possibly to be doing a little bit better in regards to the right lower extremity she also tells me however she has been wearing the compression wrap over the past week since I spoke with her as well that is a Kerlix and Coban wrap on the right. No fevers, chills, nausea, vomiting, or diarrhea. 01/19/2019 on evaluation today patient appears to be doing better with regard to her bilateral lower extremities especially the right. I feel like the compression has been beneficial for her which is great news. She did get a call from her primary care provider on her way here today telling her that she did have methicillin- resistant Staphylococcus aureus and he was calling in a couple new antibiotics for her including a ointment to be applied she tells me 3 times a day. With that being said this sounds like likely to be Bactroban which I think could be applied with each dressing/wrap change but I  would not be able to accommodate her applying this 3 times a day. She is in agreement with the least doing this we will add that to her orders today. 01/26/2019 on evaluation today patient actually appears to be doing much better with regard to her right lower extremity. Her left lower extremity is also doing quite well all things considering. Fortunately there is no evidence of active infection at this time. No fevers, chills, nausea, vomiting, or diarrhea. 02/02/2019 on evaluation today patient appears to be doing much better compared to her last evaluation. Little by little off like her right leg is returning more towards normal. There does not appear to be any signs of active infection and overall she seems to be doing quite well which is great news. I am very pleased  in this regard. No fevers, chills, nausea, vomiting, or diarrhea. 02/09/2019 upon evaluation today patient appears to be doing better with regard to her bilateral lower extremities. She has been tolerating the dressing changes without complication. Fortunately there is no signs of active infection at this time. No fevers, chills, nausea, vomiting, or diarrhea. 02/23/2019 on evaluation today patient actually appears to be doing quite well with regard to her bilateral lower extremities. She has been tolerating the dressing changes without complication. She is even used her pumps one time and states that she really felt like it felt good. With that being said she seems to be in good spirits and her legs appear to be doing excellent. 03/09/2019 on evaluation today patient appears to be doing well with regard to her right lower extremity there are no open wounds at this time she is having some discomfort but I feel like this is more neuropathy than anything. With regard to her left lower extremity she had several areas scattered around that she does have some weeping and drainage from but again overall she does not appear to be having any  significant issues and no evidence of infection at this time which is good news. 03/23/2019 on evaluation today patient appears to be doing well with regard to her right lower extremity which she tells me is still close she is using her juxta light here. Her left lower extremity she mainly just has an area on the foot which is still slightly draining although this also is doing great. Overall very pleased at this time. 04/06/2019 patient appears to be doing a little bit worse in regard to her left lower extremity upon evaluation today. She feels like this could be becoming infected again which she had issues with previous. Fortunately there is no signs of systemic infection but again this is always a struggle with her with her legs she will go from doing well to not so well in a very short amount of time. 04/20/2019 on evaluation today patient actually appears to be doing quite well with regard to her right lower extremity I do not see any signs of active infection at this time. Fortunately there is no fever chills noted. She is still taking the antibiotics which I prescribed for her at this point. In regard to the left lower extremity I do feel like some of these areas are better although again she still is having weeping from several locations at this time. 04/27/2019 on evaluation today patient appears to be doing about the same if not slightly worse in regard to her left lower extremity ulcers. She tells me when questioned that she has been sleeping in her Hoveround chair in fact she tells me she falls asleep without even knowing it. I think she is spending a whole lot of time in the chair and less time walking and moving around which is not good for her legs either. On top of that she is in a seated position which is also the worst position she is not really elevating her legs and she is also not using her lymphedema pumps. All this is good to contribute to worsening of her condition in  general. 05/18/2019 on evaluation today patient appears to be doing well with regard to her lower extremity on the right in fact this is showing no signs of any open wounds at this time. On the left she is continuing to have issues with areas that do drain. Some of the regions have healed and  there are couple areas that have reopened. She did go to the ER per the patient according to recommendations from the home health nurse due to what she was seen when she came out on 05/13/2019. Subsequently she felt like the patient needed to go to the hospital due to the fact that again she was having "milky white discharge" from her leg. Nonetheless she had and then was placed on doxycycline and subsequently seems to be doing better. 06/01/2019 upon evaluation today patient appears to be doing really in my opinion about the same. I do not see any signs of active infection which is good news. Overall she still has wounds over the bilateral lower extremities she has reopened on the right but this appears to be more of a crack where there is weeping/edema coming from the region. I do not see any evidence of infection at either site based on what I visualized today. 07/13/2019 upon evaluation today patient appears to be doing a little worse compared to last time I saw her. She since has been in the hospital from 06/21/2019 through 06/29/2019. This was secondary to having Covid. During that time they did apply lotion to her legs which unfortunately has caused her to develop a myriad of open wounds on her lower extremities. Her legs do appear to be doing better as far as the overall appearance is concerned but nonetheless she does have more open and weeping areas. 07/27/2019 upon evaluation today patient appears to be doing more poorly to be honest in regard to her left lower extremity in particular. There is no signs of systemic infection although I do believe she may have local infection. She notes she has been having  a lot of blue/green drainage which is consistent potentially with Pseudomonas. That may be something that we need to consider here as well. The doxycycline does not seem to have been helping. 08/03/2019 upon evaluation today patient appears to be doing a little better in my opinion compared to last week's evaluation. Her culture I did review today and she is on appropriate medications to help treat the Enterobacter that was noted. Overall I feel like that is good news. With that being said she is unfortunately continuing to have a lot of drainage and though it is doing better I still think she has a long ways to go to get things dried up in general. Fortunately there is no signs of systemic infection. 08/10/2019 upon evaluation today patient appears to be doing may be slightly better in regard to her left lower extremity the right lower extremity is doing much better. Fortunately there is no signs of infection right now which is good news. No fevers, chills, nausea, vomiting, or diarrhea. 08/24/2019 on evaluation today patient appears to be doing slightly better in regard to her lower extremities. The left lower extremity seems to be healed the right lower extremity is doing better though not completely healed as far as the openings are concerned. She has some generalized issues here with edema and weeping secondary to her lymphedema though again I do believe this is little bit drier compared to prior [name] evaluations. In general I am very pleased with how things seem to be progressing. No fevers, chills, nausea, vomiting, or diarrhea. 08/31/2019 upon evaluation today patient actually seems to making some progress here with regard to the left lower extremity in particular. She has been tolerating the dressing changes without complication. Fortunately there is no signs of active infection at this time. No fevers,  chills, nausea, vomiting, or diarrhea. She did see Dr. Doren Custard and he did note that she did have  a issue with the left great saphenous vein and the small saphenous vein in the leg. With that being said he was concerned about the possibility of laser ablation not being extremely successful. He also mentioned a small risk of DVT associated with the procedure. However if the wounds do not continue to improve he stated that that would probably be the way to go. Fortunately the patient's legs do seem to be doing much better. 09/07/2019 upon evaluation today patient appears to be doing better with regard to her lower extremities. She has been tolerating the dressing changes without complication. With that being said she is showing signs of improvement and overall very pleased. There are some areas on her leg that I think we do need to debride we discussed this last week the patient is in agreement with doing that as long as it does not hurt too badly. 09/14/2019 upon evaluation today patient appears to be doing decently well with regard to her left lower extremity. She is not having near as much weeping as she has had in the past things seem to be drying up which is good news. There is no signs of active infection at this time. 09/21/19 upon evaluation today patient appears to be doing better in regard overall to her bilateral lower extremities. She again has less open than she did previous and each week I feel like this is getting better. Fortunately there is no signs of active infection at this time. No fevers, chills, nausea, vomiting, or diarrhea. 09/28/2019 upon evaluation today patient actually appears to be showing signs of improvement with regard to her left lower extremity. Unfortunately the right medial lower extremity around the ankle region has reopened to some degree but this appears to be minimal still which is good news. There is no signs of active infection at this time which is also good news. 10/12/2019 upon evaluation today patient appears to be doing okay with regard to her bilateral  lower extremities today. The right is a little bit worse then last evaluation 2 [name] ago. The left is actually doing a little better in my opinion. Overall there is no signs of active infection at this time that I see. Obviously that something we have to keep a close eye on she is very prone to this with the significant and multiple openings that she has over the bilateral lower extremities. 10/19/2019 upon evaluation today patient appears to be doing about the best that I have seen her in quite some time. She has been tolerating the dressing changes without complication. There does not appear to be any signs of active infection and overall I am extremely happy with the way her legs appeared. She is drying up quite nicely and overall is having less pain. 11/09/2019 upon evaluation today patient appears to be doing better in regard to her wounds. She seems to be drying up more and more each time I see her this is just taking a very long time. Fortunately there is no signs of active infection at this time. 11/23/2019 upon evaluation today patient actually appears to be doing excellent in regard to her lower extremities at this point compared to where she has been. Fortunately there is no signs of active infection at this time. She did go to the hospital last week for nausea and vomiting completely unrelated to her wounds. Fortunately she is doing better  she was given some Reglan and got better. She had associated abdominal pain but they never found out what was going on. 12/07/2019 upon evaluation today patient appears to be doing well for the most part in regard to her legs. She unfortunately has not been keeping the Coban portion of her wraps on therefore the compression has not really been sufficient for what it is supposed to be. Nonetheless she tells me that it just hurt too bad therefore she removed it. 12/21/2019 upon evaluation today patient actually appears to be doing quite well with regard to her  legs. I do feel like she has been making progress which is great news and overall there is no signs of active infection at this time. No fevers, chills, nausea, vomiting, or diarrhea. 01/04/2020 upon evaluation today patient presents for follow-up concerning her lower extremity edema bilaterally. She still has open wounds she has not been using her lymphedema pumps. She is also not been utilizing her compression wraps appropriately she tends to unwrap them, take them off, or states that they hurt. Obviously the reason they hurt is because her legs start to swell but the issue is if she would use her compression/lymphedema pumps regularly she would not swell and she would have the pain. Nonetheless she has not even picked them up once honestly over the past several months and may be even as much as in the past year based on my opinion and what have seen. She tells me today that after last week when I talked about this with her specifically actually that was 2 [name] ago that she "forgot". 8//21 on evaluation today patient appears to be doing a little better in regard to her legs bilaterally. Fortunately there is no signs of active infection at this time. She tells me that she used her lymphedema pumps all of one time over the past 2 [name] since I last saw her. She tells me that she has been too busy in order to continue to use these. Electronic Signature(s) Signed: 01/18/2020 11:18:39 AM By: [name] PA-C Entered By: [name] on 01/18/2020 11:18:39 -------------------------------------------------------------------------------- Physical Exam Details Patient Name: Date of Service: KYNZEE, DEVINNEY 01/18/2020 10:00 A M Medical Record Number: [id number] Patient Account Number: [id number] Date of Birth/Sex: Treating RN: 06-Oct-1950 (69 y.o. [name] Primary Care Provider: Dustin [name] Other Clinician: Referring Provider: Treating Provider/Extender: [name] Askew,  FRED [name] in Treatment: 128 Constitutional Obese and well-hydrated in no acute distress. Respiratory normal breathing without difficulty. Psychiatric this patient is able to make decisions and demonstrates good insight into disease process. Alert and Oriented x 3. pleasant and cooperative. Notes Upon inspection patient's wounds actually are showing some signs of slight improvement overall her legs are not weeping is much as of seen in the past and again there is a lot of new epithelial growth but in general she does not appear to be doing as well as she has in the past. Electronic Signature(s) Signed: 01/18/2020 11:27:16 AM By: [name] PA-C Entered By: [name] on 01/18/2020 11:27:16 -------------------------------------------------------------------------------- Physician Orders Details Patient Name: Date of Service: [name]. 01/18/2020 10:00 A M Medical Record Number: [id number] Patient Account Number: [id number] Date of Birth/Sex: Treating RN: August 18, 1950 (69 y.o. [name] Primary Care Provider: Dustin [name] Other Clinician: Referring Provider: Treating Provider/Extender: [name] in Treatment: [phone] Verbal / Phone Orders: No Diagnosis Coding ICD-10 Coding Code Description E11.622 Type 2 diabetes mellitus  with other skin ulcer I89.0 Lymphedema, not elsewhere classified I87.331 Chronic venous hypertension (idiopathic) with ulcer and inflammation of right lower extremity I87.332 Chronic venous hypertension (idiopathic) with ulcer and inflammation of left lower extremity L97.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 leg with fat layer exposed L97.822 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 I10 Essential (primary) hypertension E66.01 Morbid (severe) obesity due to excess calories F41.8  Other specified anxiety disorders R53.1 Weakness Follow-up Appointments Return Appointment in 2 [name]. Dressing Change Frequency Wound #61 Left,Circumferential Lower Leg Change dressing three times week. Wound #64 Left,Dorsal Foot Change dressing three times week. Wound #65 Right,Medial Lower Leg Change dressing three times week. Skin Barriers/Peri-Wound Care Barrier cream - zinc oxide cream to any macerated areas Moisturizing lotion - to dry skin Wound Cleansing Clean wound with Wound Cleanser - all wounds, wash legs with soap and water with dressing changes May shower with protection. Primary Wound Dressing Wound #61 Left,Circumferential Lower Leg Calcium Alginate with Silver Wound #64 Left,Dorsal Foot Calcium Alginate with Silver Wound #65 Right,Medial Lower Leg Calcium Alginate with Silver Secondary Dressing Wound #61 Left,Circumferential Lower Leg Dry Gauze ABD pad - as needed Wound #64 Left,Dorsal Foot Dry Gauze ABD pad - as needed Wound #65 Right,Medial Lower Leg Dry Gauze Edema Control 3 Layer Compression System - Bilateral void standing for long periods of time - walking is encouraged A Elevate legs to the level of the heart or above for 30 minutes daily and/or when sitting, a frequency of: - do not sleep in chair with feet dangling, MUST elevate legs while sitting Exercise regularly Segmental Compressive Device. - lymphedema pumps 60 minutes 1- 2 times per day Off-Loading Turn and reposition every 2 hours Additional Orders / Instructions Follow Nutritious Diet - T include vitamin A, vitamin C, and Zinc along with increased protein intake. o [place] [facility] for wound care. - [facility] Electronic Signature(s) Signed: 01/18/2020 4:54:27 PM By: [name] RN, BSN Signed: 01/18/2020 5:10:43 PM By: [name] PA-C Entered By: [name] on 01/18/2020  11:08:25 -------------------------------------------------------------------------------- Problem List Details Patient Name: Date of Service: [name]. 01/18/2020 10:00 A M Medical Record Number: [id number] Patient Account Number: [id number] Date of Birth/Sex: Treating RN: 1950/09/07 (69 y.o. [name] Primary Care Provider: Dustin [name] Other Clinician: Referring Provider: Treating Provider/Extender: [name] in Treatment: 128 Active Problems ICD-10 Encounter Code Description Active Date MDM Diagnosis E11.622 Type 2 diabetes mellitus with other skin ulcer 08/05/2017 No Yes I89.0 Lymphedema, not elsewhere classified 08/05/2017 No Yes I87.331 Chronic venous hypertension (idiopathic) with ulcer and inflammation of right 08/05/2017 No Yes lower extremity I87.332 Chronic venous hypertension (idiopathic) with ulcer and inflammation of left 08/05/2017 No Yes lower extremity L97.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 leg with fat layer 08/05/2017 No Yes exposed L97.822 Non-pressure chronic ulcer of other part of left lower leg with fat layer exposed2/20/2019 No Yes L97.522 Non-pressure chronic ulcer of other part of left foot with fat layer exposed 06/01/2019 No Yes I10 Essential (primary) hypertension 08/05/2017 No Yes E66.01 Morbid (severe) obesity due to excess calories 08/05/2017 No Yes F41.8 Other specified anxiety disorders 08/05/2017 No Yes R53.1 Weakness 08/05/2017 No Yes Inactive Problems Resolved Problems Electronic Signature(s) Signed: 01/18/2020 10:42:46 AM By: [name] PA-C Entered By: [name] on  01/18/2020 10:42:46 -------------------------------------------------------------------------------- Progress Note Details Patient Name: Date of Service: [name], [name]. 01/18/2020 10:00 A M Medical Record Number: [id number] Patient Account Number: [id number] Date of Birth/Sex: Treating RN: [birth year] (69 y.o.  [name] Primary Care Provider: Dustin [name] Other Clinician: Referring Provider: Treating Provider/Extender: [name] in Treatment: 128 Subjective Chief Complaint Information obtained from Patient Bilateral reoccurring LE ulcers History of Present Illness (HPI) this patient has been seen a couple of times before and returns with recurrent problems to her right and left lower extremity with swelling and weeping ulcerations due to not wearing her compression stockings which she had been advised to do during her last discharge, at the end of June 2018. During her last visit the patient had had normal arterial blood flow and her venous reflux study did not necessitate any surgical intervention. She was recommended compression and elevation and wound care. After prolonged treatment the patient was completely healed but she has been noncompliant with wearing or compressions.. She was here last week with an outpatient return visit planned but the patient came in a very poor general condition with altered mental status and was rushed to the ER on my request. With a history of hypertension, diabetes, TIA and right-sided weakness she was set up for an MRI on her brain and cervical spine and was sent to [facility]. Getting an MRI done was very difficult but once the workup was done she was found not to have any spinal stenosis, epidural abscess or hematoma or discitis. This was radiculopathy to be treated as an outpatient and she was given a follow-up appointment. Today she is feeling much better alert and oriented and has come to reevaluate her bilateral lower extremity lymphedema and ulceration 03/25/2017 -- she was admitted to the hospital on 03/16/2017 and discharged on 03/18/2017 with left leg cellulitis and ulceration. She was started on vancomycin and Zosyn and x-ray showed no bony involvement. She was treated for a cellulitis with IV antibiotics changed to  Rocephin and Flagyl and was discharged on oral Keflex and doxycycline to complete a 7 day course. Last hemoglobin A1c was 7.1 and her other ailments including hypertension got asthma were appropriately treated. 05/06/2017 -- she is awaiting the right size of compression stockings from [place] but other than that has been doing well. ====== Old notes 69 year old patient was seen one time last October and was lost to follow-up. She has recurrent problems with weeping and ulceration of her left lower extremity and has swelling of this for several years. It has been worse for the last 2 months. Past medical history is significant for diabetes mellitus type 2, hypertension, gout, morbid obesity, depressive disorders, hiatal hernia, migraines, status post knee surgery, risk of a cholecystectomy, vaginal hysterectomy and breast biopsy. She is not a smoker. As noted before she has never had a venous duplex study and an arterial ABI study was attempted but the left lower extremity was noncompressible 10/01/2016 -- had a lower extremity venous duplex reflux evaluation which showed no evidence of deep vein reflux in the right or left lower extremity, and no evidence of great saphenous vein reflux more than 500 ms in the right or left lower extremity, and the left small saphenous vein is incompetent but no vascular consult was recommended. review of her electronic medical records noted that the ABI was checked in July 2017 where the right ABI was normal limits and the left ABI could not be ascertained due to pain  with cuff pressure but the waveforms are within normal limits. her arterial duplex study scheduled for April 27. 10/08/2016 -- the patient has various reasons for not having a compression on and for the last 3 days she has had no compression on her left lower extremity either due to pain or the lack of nursing help. She does not use her juxta lites either. 10/15/2016 -- the patient did not keep her  appointment for arterial duplex study on April 27 and I have asked her to reschedule this. Her pain is out of proportion with the physical findings and she continuously fails to wear a compression wraps and cuts them off because she says she cannot tolerate the pain. She does not use her juxta lites either. 10/22/2016 -- he has rescheduled her arterial duplex study to May 21 and her pain today is a bit better. She has not been wearing her juxta lites on her right lower extremity but now understands that she needs to do this. She did tolerate the to press compression wrap on her left lower extremity 10/29/2016 --arterial duplex study is scheduled for next week and overall she has been tolerating her compression wraps and also using her juxta lites on her right lower extremity 11/05/2016 -- the right ABI was 0.95 the left was 1.03. The digit TBI is on the right was 0.83 on the left was 0.92 and she had biphasic flow through these vessels. The impression was that of normal lower extremity arterial study. 11/12/2016 -- her pain is minimal and she is doing very well overall. 11/26/2016 -- she has got juxta lites and her insurance will not pay for additional dual layer compression stockings. She is going to order some from [facility]. 05/12/2017 -- her juxta lites are very old and too big for her and these have not been helping with compression. She did get 20-30 mm compression stockings from [facility] but she and her husband are unable to put these on. I believe she will benefit from bilateral Extremit-ease, compression stockings and we will measure her for these today. 05/20/2017 -- lymphedema on the left lower extremity has increased a lot and she has a open ulceration as a result of this. The right lower extremity is looking pretty good. She has decided to by the compression stockings herself and will get reimbursed by the home health, at a later date. 05/27/2017 -- her sciatica is bothering her a lot and  she thought her left leg pain was caused due to the compression wrap and hence removed it and has significant lymphedema. There is no inflammation on this left lower extremity. 06/17/17 on evaluation today patient appears to be doing very well and in fact is completely healed in regard to her ulcerations. Unfortunately however she does have continued issues with lymphedema nonetheless. We did order compression garments for her unfortunately she states that the size that she received were large although we ordered medium. Obviously this means she is not getting the optimal compression. She does not have those with her today and therefore we could not confirm and contact the company on her behalf. Nonetheless she does state that she is going to have her husband bring them by tomorrow so that we can verify and then get in touch with the company. No fevers, chills, nausea, or vomiting noted at this time. Overall patient is doing better otherwise and I'm pleased with the progress she has made. 07/01/17 on evaluation today patient appears to be doing very well in regard  to her bilateral lower extremity she does not have any openings at this point which is excellent news. Overall I'm pleased with how things have progressed up to this time. Since she is doing so well we did order her compression which we are seeing her today to ensure that it fits her properly and everything is doing well in that regard and then subsequently she will be discharged. ============ Old Notes: 03/31/16 patient presents today for evaluation concerning open wounds that she has over the left medial ankle region as well as the left dorsal foot. She has previously had this occur although it has been healed for a number of months after having this for about a year prior until her hospitalization on 01/05/16. At that point in time it appears that she was admitted to the hospital for left lower extremity cellulitis and was placed on  vancomycin and Zosyn at that point. Eventually upon discharge on January 15, 2016 she was placed on doxycycline at that point in time. Later on 03/27/16 positive wound culture growing Escherichia coli this was switched to amoxicillin. Currently she tells me that she is having pain radiated to be a 7 out of 10 which can be as high as 10 out of 10 with palpation and manipulation of the wound. This wound appears to be mainly venous in nature due to the bilateral lower extremity venous stasis/lymphedema. This is definitely much worse on her left than the right side. She does have type 1 diabetes mellitus, hypertension, morbid obesity, and is wheelchair dependent.during the course of the hospital stay a blood culture was also obtained and fortunately appeared negative. She also had an x-ray of the tibia/fibula on the left which showed no acute bone abnormality. Her white blood cell count which was performed last on 03/25/16 was 7.3, hemoglobin 12.8, protein 7.1, albumin 3.0. Her urine culture appeared to be negative for any specific organisms. Patient did have a left lower extremity venous duplex evaluation for DVT . This did not include venous reflux studies but fortunately was negative for DVT Patient also had arterial studies performed which revealed that she had a . normal ABI on the right though this was unable to be performed on the left secondary to pain that she was having around the ankle region due to the wound. However it was stated on report that she had biphasic pulses and apparently good blood flow. ========== 06/03/17 she is here in follow-up evaluation for right lower extremity ulcer. The right lower sure he has healed but she has reopened to the left medial malleolus and dorsal foot with weeping. She is waiting for new compression garments to arrive from home health, the previous compression garments were ill fitting. We will continue with compression bilaterally and follow-up in 2  [name] Readmission: 08/05/17 on evaluation today patient appears to be doing somewhat poorly in regard to her left lower extremity especially although the right lower extremity has a small area which may no longer be open. She has been having a lot of drainage from the left lower extremity however he tells me that she has not been able to use the EXTREMIT-EASE Compression at this point. She states that she did better and was able to actually apply the Juxta-Lite compression although the wound that she has is too large and therefore really does not compress which is why she cannot wear it at this point. She has no one who can help her put it on regular basis her son can sometimes  but he's not able to do it most of the time. I do believe that's why she has begun to weave and have issues as she is currently yet again. No fevers, chills, nausea, or vomiting noted at this time. Patient is no evidence of dementia. 08/12/17 on evaluation today patient appears to still be doing fairly well in regard to the draining areas/weeping areas at this point. With that being said she unfortunately did go to the ER yesterday due to what was felt to be possibly a cellulitis. They place her on doxycycline by mouth and discharge her home. She definitely was not admitted. With that being said she states she has had more discomfort which has been unusual for her even compared to prior times and she's had infections.08/12/17 on evaluation today patient appears to still be doing fairly well in regard to the draining areas/weeping areas at this point. With that being said she unfortunately did go to the ER yesterday due to what was felt to be possibly a cellulitis. They place her on doxycycline by mouth and discharge her home. She definitely was not admitted. With that being said she states she has had more discomfort which has been unusual for her even compared to prior times and she's had infections. 08/19/17 put evaluation today  patient tells me that she's been having a lot of what sounds to be neuropathic type pain in regard to her left lower extremity. She has been using over-the-counter topical bins again which some believe. That in order to apply the she actually remove the wrap we put on her last Wednesday on Thursday. Subsequently she has not had anything on compression wise since that time. The good news is a lot of the weeping areas appear to have closed at this point again I believe she would do better with compression but we are struggling to get her to actually use what she needs to at this point. No fevers, chills, nausea, or vomiting noted at this time. 09/03/17 on evaluation today patient appears to be doing okay in regard to her lower extremities in regard to the lymphedema and weeping. Fortunately she does not seem to show any signs of infection at this point she does have a little bit of weeping occurring in the right medial malleolus area. With that being said this does not appear to be too significant which is good news. 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 She has severe skin damage secondary to both of these features involving the dorsal left foot and medial left ankle and lower leg. Still has open areas in the left anterior foot. The area on the right closed over. She uses her own juxta light stockings. She does not have an arterial issue 09/16/17 on evaluation today patient actually appears to be doing excellent in regard to her bilateral lower extremity swelling. The Juxta-Lite compression wrap seem to be doing very well for her. She has not however been using the portion that goes over her foot. Her left foot still is draining a little bit not nearly as significant as it has been in the past but still I do believe that she likely needs to utilize the full wrap including the foot portion of this will improve as well. She also has  been apparently putting on a significant amount of Vaseline which also think is not helpful for her. I recommended that if she feels she needs something for moisturizer Eucerin will probably  be better. 09/30/17 on evaluation today patient presents with several new open areas in regard to her left lower extremity although these appear to be minimal and mainly seem to be more moisture breakdown than anything. Fortunately she does not seem to have any evidence of infection which is great news. She has been tolerating the dressing changes without complication we are using silver alginate on the foot she has been using AB pads to have the legs and using her Juxta- Lite compression which seems to be controlling her swelling very well. Overall I'm pleased with the poor way she has progressed. 10/14/17 on evaluation today patient appears to be doing better in regard to her left lower extremity areas of weeping. She does still have some discomfort although in general this does not appear to be as macerated and I think it is progressing nicely. I do think she still needs to wear the foot portion of her Juxta- Lite in order to get the most benefit from the wrap obviously. She states she understands. Fortunately there does not appear to be evidence of infection at this time which is great news. 10/28/17 on evaluation today patient appears to be doing excellent in regard to her left lower extremity. She has just a couple areas that are still open and seem to be causing any trouble whatsoever. For that reason I think that she is definitely headed in the right direction the spots are very tiny compared to what we have been dealing with in the past. 11/11/17 on evaluation today patient appears to have a right lateral lower extremity ulcer that has opened since I last saw her. She states this is where the home health nurse that was coming out remove the dressing without wetting the alginate first. Nonetheless I do not know  if this is indeed the case or not but more importantly we have not ordered home help to be coming out for her wounds at all. I'm unsure as to why they are coming out and we're gonna have to check on this and get things situated in that regard. With that being said we currently really do not need them to be coming out as the patient has been taking care of her leg herself without complication and no issues. In fact she was doing much better prior to nursing coming out. 11/25/17 on evaluation today patient actually appears to be doing fairly well in regard to her left lower extremity swelling. In fact she has very little area of weeping at this point there's just a small spot on the lateral portion of her right leg that still has me just a little bit more concerned as far as wanting to see this clear up before I discharge her to caring for this at home. Nonetheless overall she has made excellent progress. 12/09/17 on evaluation today patient appears to be doing rather well in regard to her lower extremity edema. She does have some weeping still in the left lower extremity although the big area we were taking care of two [name] ago actually has closed and she has another area of weeping on the left lower extremity immediately as well is the top of her foot. She does not currently have lymphedema pumps she has been wearing her compression daily on a regular basis as directed. With that being said I think she may benefit from lymphedema pumps. She has been wearing the compression on a regular basis since I've been seeing her back in February 2019 through now  and despite this she still continues to have issues with stage III lymphedema. We had a very difficult time getting and keeping this under control. 12/23/17 on evaluation today patient actually appears to be doing a little bit more poorly in regard to her bilateral lower extremities. She has been tolerating the Juxta-Lite compression wraps. Unfortunately  she has two new ulcers on the right lower extremity and left lower Trinity ulceration seems to be larger. Obviously this is not good news. She has been tolerating the dressings without complication. 12/30/17 on evaluation today patient actually appears to be doing much better in regard to her bilateral lower extremity edema. She continues to have some issues with ulcerations and in fact there appears to be one spot on each leg where the wrap may have caused a little bit of a blister which is subsequently opened up at this point is given her pain. Fortunately it does not appear to be any evidence of infection which is good news. No fevers chills noted. 01/13/18 on evaluation today patient appears to be doing rather well in regard to her bilateral lower extremities. The dressings did get kind of stuck as far as the wound beds are concerned but again I think this is mainly due to the fact that she actually seems to be showing signs of healing which is good news. She's not having as much drainage therefore she was having more of the dressing sticking. Nonetheless overall I feel like her swelling is dramatically down compared to previous. 01/20/18 on evaluation today patient unfortunately though she's doing better in most regards has a large blister on the left anterior lower extremity where she is draining quite significantly. Subsequently this is going to need debridement today in order to see what's underneath and ensure she does not continue to trapping fluid at this location. Nonetheless No fevers, chills, nausea, or vomiting noted at this time. 01/27/18 on evaluation today patient appears to be doing rather well at this point in regard to her right lower extremity there's just a very small area that she still has open at this point. With that being said I do believe that she is tolerating the compression wraps very well in making good progress. Home health is coming out at this point to see her. Her left  lower extremity on the lateral portion is actually what still mainly open and causing her some discomfort for the most part 02/10/18 on evaluation today patient actually appears to be doing very well in regard to her right lower extremity were all the ulcers appear to be completely close. In regard to the left lower extremity she does have two areas still open and some leaking from the dorsal surface of her foot but this still seems to be doing much better to me in general. 02/24/18 on evaluation today patient actually appears to be doing much better in regard to her right lower extremity this is still completely healed. Her left lower extremity is also doing much better fortunately she has no evidence of infection. The one area that is gonna require some debridement is still on the left anterior shin. Fortunately this is not hurting her as badly today. 03/10/18 on evaluation today patient appears to be doing better in some regards although she has a little bit more open area on the dorsal foot and she also has some issues on the medial portion of the left lower extremity which is actually new and somewhat deep. With that being said there fortunately does  not appear to be any significant signs of infection which is good news. No fevers, chills, nausea, or vomiting noted at this time. In general her swelling seems to be doing fairly well which is good news. 03/31/18 on evaluation today patient presents for follow-up concerning her left lower extremity lymphedema. Unfortunately she has been doing a little bit more poorly since I last saw her in regard to the amount of weeping that she is experiencing. She's also having some increased pain in the anterior shin location. Unfortunately I do not feel like the patient is making such good progress at this point a few [name] back she was definitely doing much better. 04/07/18 on evaluation today patient actually appears to be showing some signs of improvement as  far as the left lower extremity is concerned. She has been tolerating the dressing changes and it does appear that the Drawtex did better for her. With that being said unfortunately home health is stating that they cannot obtain the Drawtex going forward. Nonetheless we're gonna have to check and see what they may be able to get the alginate they were using was getting stuck in causing new areas of skin being pulled all that with and subsequently weep and calls her to worsen overall this is the first time we've seen improvement at this time. 04/14/18 on evaluation today patient actually appears to be doing rather well at this point there does not appear to be any evidence of infection at this time and she is actually doing excellent in regard to the weeping in fact she almost has no openings remaining even compared to just last week this is a dramatic improvement. No fevers chills noted 04/21/18 evaluation today patient actually appears to be doing very well. She in fact is has a small area on the posterior lower extremity location and she has a small area on the dorsal surface of her foot that are still open both of which are very close to closing. We're hoping this will be close shortly. She brought her Juxta-Lite wrap with her today hoping that would be able to put her in it unfortunately I don't think were quite at that point yet but we're getting closer. 04/28/18 upon evaluation today patient actually appears to be doing excellent in regard to her left lower extremity ulcer. In fact the region on the posterior lower extremity actually is much smaller than previously noted. Overall I'm very happy with the progress she has made. She again did bring her Juxta-Lite although we're not quite ready for that yet. 05/11/18 upon evaluation today patient actually appears to be doing in general fairly well in regard to her left lower Trinity. The swelling is very well controlled. With that being said she has  a new area on the left anterior lower extremity as well as between the first and second toes of her left foot that was not present during the last evaluation. The region of her posterior left lower extremity actually appears to be almost completely healed. T be honest I'm very pleased with o the way that stands. Nonetheless I do believe that the lotion may be keeping the area to moist as far as her legs are concerned subsequently I'm gonna consider discontinuing that today. 05/26/18 on evaluation today patient appears to be doing rather well in regard to her left lower should be ulcers. In fact everything appears to be close except for a very small area on the left posterior lower extremity. Fortunately there does not appear  to be any evidence of infection at this time. Overall very pleased with her progress. 06/02/18 and evaluation today patient actually appears to be doing very well in regard to her lower extremity ulcers. She has one small area that still continues to weep that I think may benefit her being able to justify lotion and user Juxta-Lite wraps versus continued to wrap her. Nonetheless I think this is something we can definitely look into at this point. 06/23/18 on evaluation today patient unfortunately has openings of her bilateral lower extremities. In general she seems to be doing much worse than when I last saw her just as far as her overall health standpoint is concerned. She states that her discomfort is mainly due to neuropathy she's not having any other issues otherwise. No fevers, chills, nausea, or vomiting noted at this time. 06/30/18 on evaluation today patient actually appears to be doing a little worse in regard to her right lower extremity her left lower extremity of doing fairly well. Fortunately there is no sign of infection at this time. She has been tolerating the dressing changes without complication. Home health did not come out like they were supposed to for the  appropriate wrap changes. They stated that they never received the orders from [place] which were fax. Nonetheless we will send a copy of the orders with the patient today as well. 07/07/18 on evaluation today patient appears to be doing much better in regard to lower extremities. She still has several openings bilaterally although since I last saw her her legs did show obvious signs of infection when she later saw her nurse. Subsequently a culture was obtained and she is been placed on Bactrim and Keflex. Fortunately things seem to be looking much better it does appear she likely had an infection. Again last week we'd even discussed it but again there really was not any obvious sign that she had infection therefore we held off on the antibiotics. Nonetheless I'm glad she's doing better today. 07/14/18 on evaluation today patient appears to be doing much better regarding her bilateral lower Trinity's. In fact on the right lower for me there's nothing open at this point there are some dry skin areas at the sites where she had infection. Fortunately there is no evidence of systemic infection which is excellent news. No fevers chills noted 07/21/18 on evaluation today patient actually appears to be doing much better in regard to her left lower extremity ulcers. She is making good progress and overall I feel like she's improving each time I see her. She's having no pain I do feel like the infection is completely resolved which is excellent news. No fevers, chills, nausea, or vomiting noted at this time. 07/28/18 on evaluation today patient appears to be doing very well in regard to her left lower [employer]. Everything seems to be showing signs of improvement which is excellent news. Overall very pleased with the progress that has been made. Fortunately there's no evidence of active infection at this time also excellent news. 08/04/18 on evaluation today patient appears to be doing more poorly in regard to her  bilateral lower extremities. She has two new areas open up on the right and these were completely closed as of last week. She still has the two spots on the left which in my pinion seem to be doing better. Fortunately there's no evidence of infection again at this point. 08/11/18 on evaluation today patient actually appears to be doing very well in regard to her  bilateral lower Trinity wounds that all seem to be doing better and are measures smaller today. Fortunately there's no signs of infection. No fevers, chills, nausea, or vomiting noted at this time. 08/18/18 on evaluation today patient actually appears to be doing about the same inverter bilateral lower extremities. She continues to have areas that blistering open as was drain that fortunately nothing too significant. Overall I feel like Drawtex may have done better for her however compared to the collagen. 08/25/18 on evaluation today patient appears to be doing a little bit more poorly today even compared to last time I saw her. Again I'm not exactly sure why she's making worse progress over the past several [name]. I'm beginning to wonder if there is some kind of underlying low level infection causing this issue. I did actually take a culture from the left anterior lower extremity but it was a new wound draining quite a bit at this point. Unfortunately she also seems to be having more pain which is what also makes me worried about the possibility of infection. This is despite never erythema noted at this point. 09/01/18 on evaluation today patient actually appears to be doing a little worse even compared to last week in regard to bilateral lower extremities. She did go to the hospital on the 16th was given a dose of IV Zosyn and then discharged with a recommendation to continue with the Bactrim that I previously prescribed for her. Nonetheless she is still having a lot of discomfort she tells me as well at this time. This is definitely unfortunate.  No fevers, chills, nausea, or vomiting noted at this time. 09/08/18 on evaluation today patient's bilateral lower extremities actually appear to be shown signs of improvement which is good news. Fortunately there does not appear to be any signs of active infection I think the anabiotic is helping in this regard. Overall I'm very pleased with how she is progressing. 09/15/18 patient was actually seen in ER yesterday due to her legs as well unfortunately. She states she's been having a lot of pain and discomfort as well as a lot of drainage. Upon inspection today the patient does have a lot of swelling and drainage I feel like this is more related to lymphedema and poor fluid control than it is to infection based on what I'm seeing. The physician in the emergency department also doubted that the patient was having a significant infection nonetheless I see no evidence of infection obvious at this point although I do see evidence of poor fluid control. She still not using a compression pumps, she is not elevating due to her lift chair as well as her hospital bed being broken, and she really is not keeping her legs up as much as they should be and also has been taking off her wraps. All this combined I think has led to poor fluid control and to be honest she may be somewhat volume overloaded in general as well. I recommend that she may need to contact your physician to see if a prescription for a diuretic would be beneficial in their opinion. As long as this is safe I think it would likely help her. 09/29/18 on evaluation today patient's left lower extremity actually appears to be doing quite a bit better. At least compared to last time that I saw her. She still has a large area where she is draining from but there's a lot of new skin speckled trout and in fact there's more new skin that there  are open areas of weeping and drainage at this point. This is good news. With regard to the right lower extremity this  is doing much better with the only open area that I really see being a dry spot on the right lateral ankle currently. Fortunately there's no signs of active infection at this time which is good news. No fevers, chills, nausea, or vomiting noted at this time. The patient seems somewhat stressed and overwhelmed during the visit today she was very lethargic as such. She does and she is not taking any pain medications at this point. Apparently according to her husband are also in the process of moving which is probably taking its toll on her as well. 10/06/18 on evaluation today patient appears to be doing rather well in regard to her lower extremities compared to last evaluation. Fortunately there's no signs of active infection. She tells me she did have an appointment with her primary. Nonetheless he was concerned that the wounds were somewhat deep based on pictures but we never actually saw her legs. She states that he had her somewhat worried due to the fact that she was fearing now that she was [place] have to have an amputation. With that being said based on what I'm seeing check she looks better this week that she has the last two times I've seen her with much less drainage I'm actually pleased in this regard. That doesn't mean that she's out of the water but again I do not think what the point of talking about education at all in regard to her leg. She is very happy to hear this. She is also not having as much pain as she was having last week. 10/13/18 unfortunately on evaluation today patient still continues to have a significant amount of drainage she's not letting home health actually apply the compression dressings at this point. She's trying to use of Juxta-Lite of the top of Kerlex and the second layer of the three layer compression wrap. With that being said she just does not seem to be making as good a progress as I would expect if she was having the compression applied and in place on a  regular basis. No fevers, chills, nausea, or vomiting noted at this time. 10/20/18 on evaluation today patient appears to be doing a little better in regard to her bilateral lower extremity ulcers. In fact the right lower extremity seems to be healed she doesn't even have any openings at this point left lower extremity though still somewhat macerated seems to be showing signs of new skin growth at multiple locations throughout. Fortunately there's no evidence of active infection at this time. No fevers, chills, nausea, or vomiting noted at this time. 10/27/18 on evaluation today patient appears to be doing much better in regard to her left lower Trinity ulcer. She's been tolerating the laptop complication and has minimal drainage noted at this point. Fortunately there's no signs of active infection at this time. No fevers, chills, nausea, or vomiting noted at this time. 11/03/18 on evaluation today patient actually appears to be doing excellent in regard to her left lower extremity. She is having very little drainage at this point there does not appear to be any significant signs of infection overall very pleased with how things have gone. She is likewise extremely pleased still and seems to be making wonderful progress week to week. I do believe antibiotics were helpful for her. Her primary care provider did place on amateur clean since I last  saw her. 11/17/18 on evaluation today patient appears to be doing worse in regard to her bilateral lower extremities at this point. She is been tolerating the dressing changes without complication. With that being said she typically takes the [place] off fairly quickly upon arriving home even after being seen here in the clinic and does not allow home health reapply command as part of the dressing at home. Therefore she said no compression essentially since I last saw her as best I can tell. With that being said I think it shows and how much swelling she has in the  open wounds that are noted at this point. Fortunately there's no signs of infection but unfortunately if she doesn't get this under control I think she will end up with infection and more significant issues. 11/24/18 on evaluation today patient actually appears to be doing somewhat better in regard to her bilateral lower extremities. 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 Nonetheless that is doing better currently so she should be able to attempt reinitiating the lymphedema pumps at this time. No fevers, chills, nausea, or vomiting noted at this time. 12/01/18 upon evaluation today patient's left lower extremity appears to be doing quite well unfortunately her right lower extremity is not doing nearly as well. She has been tolerating the dressing changes without complication unfortunately she did not keep a wrap on the right at this time. Nonetheless I believe this has led to increased swelling and weeping in the world is actually much larger than during the last evaluation with her. 12/08/18 on evaluation today patient appears to be doing about the same at this point in regard to her right lower extremity. There is some more palatable to touch I'm concerned about the possibility of there being some infection although I think the main issue is she's not keeping her compression wrap on which in turn is not allowing this area to heal appropriately. 12/22/18 on evaluation today patient appears to be doing better in regard to left lower extremity unfortunately significantly worse in regard to the right lower extremity. The areas of blistering and necrotic superficial tissue have spread and again this does not really appear to be signs of infection and all she just doesn't seem to be doing nearly as well is what she has been in the past. Overall I feel like the  Augmentin did absolutely nothing for her she doesn't seem to have any infection again I really didn't think so last time either is more of a potential preventative measure and hoping that this would make some difference but I think the main issue is she's not wearing her compression. She tells me she cannot wear the [place] been we put on she takes it off pretty much upon getting home. Subsequently she worshiped Juxta-Lite when I questioned her about how often she wears it this is no more than three hours a day obviously that leaves 21 hours that she has no compression and this is obviously not doing well for her. Overall I'm concerned that if things continue to worsen she is at great risk of both infection as well as losing her leg. 01/05/19 on evaluation today patient appears to be doing well in regard to her left lower extremity which he is allowing [place] to wrap and not so well with regard  to her right lower extremity which she is not allowing [place] to really wrap and keep the wrap on. She states that it hurts too badly whenever it's wrapped and she ends up having to take it off. She's been using the Juxta-Lite she tells me up to six hours a day although I question whether or not that's really been the case to be honest. Previously she told me three hours today nonetheless obviously the legs as long as the wrap is doing great when she is not is doing much more poorly. 01/12/2019 on evaluation today patient actually appears to be doing a little better in my opinion with regard to her right lower extremity ulcer. She has a small open area on the left lower extremity unfortunately but again this I think is part of the normal fluctuation of what she is going to have to expect with regard to her legs especially when she is not using her lymphedema pumps on a regular basis. Subsequently based on what I am seeing today I think that she does seem to be doing slightly better with regard to her right lower extremity  she did see her primary care provider on Monday they felt she had an infection and placed her on 2 antibiotics. Both Cipro and clindamycin. Subsequently again she seems possibly to be doing a little bit better in regards to the right lower extremity she also tells me however she has been wearing the compression wrap over the past week since I spoke with her as well that is a Kerlix and Coban wrap on the right. No fevers, chills, nausea, vomiting, or diarrhea. 01/19/2019 on evaluation today patient appears to be doing better with regard to her bilateral lower extremities especially the right. I feel like the compression has been beneficial for her which is great news. She did get a call from her primary care provider on her way here today telling her that she did have methicillin- resistant Staphylococcus aureus and he was calling in a couple new antibiotics for her including a ointment to be applied she tells me 3 times a day. With that being said this sounds like likely to be Bactroban which I think could be applied with each dressing/wrap change but I would not be able to accommodate her applying this 3 times a day. She is in agreement with the least doing this we will add that to her orders today. 01/26/2019 on evaluation today patient actually appears to be doing much better with regard to her right lower extremity. Her left lower extremity is also doing quite well all things considering. Fortunately there is no evidence of active infection at this time. No fevers, chills, nausea, vomiting, or diarrhea. 02/02/2019 on evaluation today patient appears to be doing much better compared to her last evaluation. Little by little off like her right leg is returning more towards normal. There does not appear to be any signs of active infection and overall she seems to be doing quite well which is great news. I am very pleased in this regard. No fevers, chills, nausea, vomiting, or diarrhea. 02/09/2019 upon  evaluation today patient appears to be doing better with regard to her bilateral lower extremities. She has been tolerating the dressing changes without complication. Fortunately there is no signs of active infection at this time. No fevers, chills, nausea, vomiting, or diarrhea. 02/23/2019 on evaluation today patient actually appears to be doing quite well with regard to her bilateral lower extremities. She has been tolerating the  dressing changes without complication. She is even used her pumps one time and states that she really felt like it felt good. With that being said she seems to be in good spirits and her legs appear to be doing excellent. 03/09/2019 on evaluation today patient appears to be doing well with regard to her right lower extremity there are no open wounds at this time she is having some discomfort but I feel like this is more neuropathy than anything. With regard to her left lower extremity she had several areas scattered around that she does have some weeping and drainage from but again overall she does not appear to be having any significant issues and no evidence of infection at this time which is good news. 03/23/2019 on evaluation today patient appears to be doing well with regard to her right lower extremity which she tells me is still close she is using her juxta light here. Her left lower extremity she mainly just has an area on the foot which is still slightly draining although this also is doing great. Overall very pleased at this time. 04/06/2019 patient appears to be doing a little bit worse in regard to her left lower extremity upon evaluation today. She feels like this could be becoming infected again which she had issues with previous. Fortunately there is no signs of systemic infection but again this is always a struggle with her with her legs she will go from doing well to not so well in a very short amount of time. 04/20/2019 on evaluation today patient actually  appears to be doing quite well with regard to her right lower extremity I do not see any signs of active infection at this time. Fortunately there is no fever chills noted. She is still taking the antibiotics which I prescribed for her at this point. In regard to the left lower extremity I do feel like some of these areas are better although again she still is having weeping from several locations at this time. 04/27/2019 on evaluation today patient appears to be doing about the same if not slightly worse in regard to her left lower extremity ulcers. She tells me when questioned that she has been sleeping in her Hoveround chair in fact she tells me she falls asleep without even knowing it. I think she is spending a whole lot of time in the chair and less time walking and moving around which is not good for her legs either. On top of that she is in a seated position which is also the worst position she is not really elevating her legs and she is also not using her lymphedema pumps. All this is good to contribute to worsening of her condition in general. 05/18/2019 on evaluation today patient appears to be doing well with regard to her lower extremity on the right in fact this is showing no signs of any open wounds at this time. On the left she is continuing to have issues with areas that do drain. Some of the regions have healed and there are couple areas that have reopened. She did go to the ER per the patient according to recommendations from the home health nurse due to what she was seen when she came out on 05/13/2019. Subsequently she felt like the patient needed to go to the hospital due to the fact that again she was having "milky white discharge" from her leg. Nonetheless she had and then was placed on doxycycline and subsequently seems to be doing better.  06/01/2019 upon evaluation today patient appears to be doing really in my opinion about the same. I do not see any signs of active infection  which is good news. Overall she still has wounds over the bilateral lower extremities she has reopened on the right but this appears to be more of a crack where there is weeping/edema coming from the region. I do not see any evidence of infection at either site based on what I visualized today. 07/13/2019 upon evaluation today patient appears to be doing a little worse compared to last time I saw her. She since has been in the hospital from 06/21/2019 through 06/29/2019. This was secondary to having Covid. During that time they did apply lotion to her legs which unfortunately has caused her to develop a myriad of open wounds on her lower extremities. Her legs do appear to be doing better as far as the overall appearance is concerned but nonetheless she does have more open and weeping areas. 07/27/2019 upon evaluation today patient appears to be doing more poorly to be honest in regard to her left lower extremity in particular. There is no signs of systemic infection although I do believe she may have local infection. She notes she has been having a lot of blue/green drainage which is consistent potentially with Pseudomonas. That may be something that we need to consider here as well. The doxycycline does not seem to have been helping. 08/03/2019 upon evaluation today patient appears to be doing a little better in my opinion compared to last week's evaluation. Her culture I did review today and she is on appropriate medications to help treat the Enterobacter that was noted. Overall I feel like that is good news. With that being said she is unfortunately continuing to have a lot of drainage and though it is doing better I still think she has a long ways to go to get things dried up in general. Fortunately there is no signs of systemic infection. 08/10/2019 upon evaluation today patient appears to be doing may be slightly better in regard to her left lower extremity the right lower extremity is doing  much better. Fortunately there is no signs of infection right now which is good news. No fevers, chills, nausea, vomiting, or diarrhea. 08/24/2019 on evaluation today patient appears to be doing slightly better in regard to her lower extremities. The left lower extremity seems to be healed the right lower extremity is doing better though not completely healed as far as the openings are concerned. She has some generalized issues here with edema and weeping secondary to her lymphedema though again I do believe this is little bit drier compared to prior [name] evaluations. In general I am very pleased with how things seem to be progressing. No fevers, chills, nausea, vomiting, or diarrhea. 08/31/2019 upon evaluation today patient actually seems to making some progress here with regard to the left lower extremity in particular. She has been tolerating the dressing changes without complication. Fortunately there is no signs of active infection at this time. No fevers, chills, nausea, vomiting, or diarrhea. She did see Dr. Durwin Nora and he did note that she did have a issue with the left great saphenous vein and the small saphenous vein in the leg. With that being said he was concerned about the possibility of laser ablation not being extremely successful. He also mentioned a small risk of DVT associated with the procedure. However if the wounds do not continue to improve he stated that that would probably  be the way to go. Fortunately the patient's legs do seem to be doing much better. 09/07/2019 upon evaluation today patient appears to be doing better with regard to her lower extremities. She has been tolerating the dressing changes without complication. With that being said she is showing signs of improvement and overall very pleased. There are some areas on her leg that I think we do need to debride we discussed this last week the patient is in agreement with doing that as long as it does not hurt too  badly. 09/14/2019 upon evaluation today patient appears to be doing decently well with regard to her left lower extremity. She is not having near as much weeping as she has had in the past things seem to be drying up which is good news. There is no signs of active infection at this time. 09/21/19 upon evaluation today patient appears to be doing better in regard overall to her bilateral lower extremities. She again has less open than she did previous and each week I feel like this is getting better. Fortunately there is no signs of active infection at this time. No fevers, chills, nausea, vomiting, or diarrhea. 09/28/2019 upon evaluation today patient actually appears to be showing signs of improvement with regard to her left lower extremity. Unfortunately the right medial lower extremity around the ankle region has reopened to some degree but this appears to be minimal still which is good news. There is no signs of active infection at this time which is also good news. 10/12/2019 upon evaluation today patient appears to be doing okay with regard to her bilateral lower extremities today. The right is a little bit worse then last evaluation 2 [name] ago. The left is actually doing a little better in my opinion. Overall there is no signs of active infection at this time that I see. Obviously that something we have to keep a close eye on she is very prone to this with the significant and multiple openings that she has over the bilateral lower extremities. 10/19/2019 upon evaluation today patient appears to be doing about the best that I have seen her in quite some time. She has been tolerating the dressing changes without complication. There does not appear to be any signs of active infection and overall I am extremely happy with the way her legs appeared. She is drying up quite nicely and overall is having less pain. 11/09/2019 upon evaluation today patient appears to be doing better in regard to her wounds.  She seems to be drying up more and more each time I see her this is just taking a very long time. Fortunately there is no signs of active infection at this time. 11/23/2019 upon evaluation today patient actually appears to be doing excellent in regard to her lower extremities at this point compared to where she has been. Fortunately there is no signs of active infection at this time. She did go to the hospital last week for nausea and vomiting completely unrelated to her wounds. Fortunately she is doing better she was given some Reglan and got better. She had associated abdominal pain but they never found out what was going on. 12/07/2019 upon evaluation today patient appears to be doing well for the most part in regard to her legs. She unfortunately has not been keeping the Coban portion of her wraps on therefore the compression has not really been sufficient for what it is supposed to be. Nonetheless she tells me that it just hurt too  bad therefore she removed it. 12/21/2019 upon evaluation today patient actually appears to be doing quite well with regard to her legs. I do feel like she has been making progress which is great news and overall there is no signs of active infection at this time. No fevers, chills, nausea, vomiting, or diarrhea. 01/04/2020 upon evaluation today patient presents for follow-up concerning her lower extremity edema bilaterally. She still has open wounds she has not been using her lymphedema pumps. She is also not been utilizing her compression wraps appropriately she tends to unwrap them, take them off, or states that they hurt. Obviously the reason they hurt is because her legs start to swell but the issue is if she would use her compression/lymphedema pumps regularly she would not swell and she would have the pain. Nonetheless she has not even picked them up once honestly over the past several months and may be even as much as in the past year based on my opinion and what have  seen. She tells me today that after last week when I talked about this with her specifically actually that was 2 [name] ago that she "forgot". 8//21 on evaluation today patient appears to be doing a little better in regard to her legs bilaterally. Fortunately there is no signs of active infection at this time. She tells me that she used her lymphedema pumps all of one time over the past 2 [name] since I last saw her. She tells me that she has been too busy in order to continue to use these. Objective Constitutional Obese and well-hydrated in no acute distress. Vitals Time Taken: 10:10 AM, Height: 62 in, Weight: 335 lbs, BMI: 61.3, Temperature: 97.9 F, Pulse: 92 bpm, Respiratory Rate: 18 breaths/min, Blood Pressure: 130/81 mmHg, Capillary Blood Glucose: 126 mg/dl. Respiratory normal breathing without difficulty. Psychiatric this patient is able to make decisions and demonstrates good insight into disease process. Alert and Oriented x 3. pleasant and cooperative. General Notes: Upon inspection patient's wounds actually are showing some signs of slight improvement overall her legs are not weeping is much as of seen in the past and again there is a lot of new epithelial growth but in general she does not appear to be doing as well as she has in the past. Integumentary (Hair, Skin) Wound #61 status is Open. Original cause of wound was Gradually Appeared. The wound is located on the Left,Circumferential Lower Leg. The wound measures 11.5cm length x 23cm width x 0.1cm depth; 207.738cm^2 area and 20.774cm^3 volume. There is Fat Layer (Subcutaneous Tissue) Exposed exposed. There is no tunneling or undermining noted. There is a large amount of serous drainage noted. The wound margin is flat and intact. There is medium (34-66%) pink, pale granulation within the wound bed. There is a medium (34-66%) amount of necrotic tissue within the wound bed including Adherent Slough. Wound #64 status is Open. Original  cause of wound was Gradually Appeared. The wound is located on the Left,Dorsal Foot. The wound measures 6.8cm length x 6.7cm width x 0.1cm depth; 35.783cm^2 area and 3.578cm^3 volume. There is Fat Layer (Subcutaneous Tissue) Exposed exposed. There is no tunneling or undermining noted. There is a medium amount of serous drainage noted. The wound margin is flat and intact. There is large (67-100%) pink, pale granulation within the wound bed. There is a small (1-33%) amount of necrotic tissue within the wound bed including Adherent Slough. Wound #65 status is Open. Original cause of wound was Gradually Appeared. The wound is located  on the Right,Medial Lower Leg. The wound measures 0.6cm length x 0.9cm width x 0.1cm depth; 0.424cm^2 area and 0.042cm^3 volume. There is Fat Layer (Subcutaneous Tissue) Exposed exposed. There is no tunneling or undermining noted. There is a medium amount of serosanguineous drainage noted. The wound margin is distinct with the outline attached to the wound base. There is large (67-100%) pink granulation within the wound bed. There is no necrotic tissue within the wound bed. 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 There was  a Three Layer Compression Therapy Procedure by Carlene Coria, RN. Post procedure Diagnosis Wound #61: Same as Pre-Procedure Wound #65 Pre-procedure diagnosis of Wound #65 is a Venous Leg Ulcer located on the Right,Medial Lower Leg . There was a Three Layer Compression Therapy Procedure by Carlene Coria, RN. Post procedure Diagnosis Wound #65: Same as Pre-Procedure Plan Follow-up Appointments: Return Appointment in 2 [name]. Dressing Change Frequency: Wound #61 Left,Circumferential Lower Leg: Change dressing three times week. Wound #64 Left,Dorsal Foot: Change dressing three times week. Wound #65 Right,Medial Lower Leg: Change dressing three times week. Skin Barriers/Peri-Wound Care: Barrier cream - zinc oxide cream to any macerated areas Moisturizing lotion - to dry skin Wound Cleansing: Clean wound with Wound Cleanser - all wounds, wash legs with soap and water with dressing changes May shower with protection. Primary Wound Dressing: Wound #61 Left,Circumferential Lower Leg: Calcium Alginate with Silver Wound #64 Left,Dorsal Foot: Calcium Alginate with Silver Wound #65 Right,Medial Lower Leg: Calcium Alginate with Silver Secondary Dressing: Wound #61 Left,Circumferential Lower Leg: Dry Gauze ABD pad - as needed Wound #64 Left,Dorsal Foot: Dry Gauze ABD pad - as needed Wound #65 Right,Medial Lower Leg: Dry Gauze Edema Control: 3 Layer Compression System - Bilateral Avoid standing for long periods of time - walking is encouraged Elevate legs to the level of the heart or above for 30 minutes daily and/or when sitting, a frequency of: - do not sleep in chair with feet dangling, MUST elevate legs while sitting Exercise regularly Segmental Compressive Device. - lymphedema pumps 60 minutes 1- 2 times per day Off-Loading: Turn and reposition every 2 hours Additional Orders / Instructions: Follow Nutritious Diet - T include vitamin A, vitamin C, and Zinc along with increased  protein intake. o Home Health: [facility]. - [facility]. I would recommend currently that we go ahead and have the patient continue with the Desitin followed by the silver alginate dressing to the open wound locations. 2. I am also can recommend that we actually continue with the lymphedema pumps she should be using this at least once a day. 3. I would also recommend that we continue at this point with the bilateral 3 layer compression wraps to try to keep edema under good control. We will see patient back for reevaluation in 2 [name] here in the clinic. If anything  worsens or changes patient will contact our office for additional recommendations. Electronic Signature(s) Signed: 01/18/2020 11:27:56 AM By: [name] PA-C Entered By: [name] on 01/18/2020 11:27:56 -------------------------------------------------------------------------------- SuperBill Details Patient Name: Date of Service: [name] 01/18/2020 Medical Record Number: [id number] Patient Account Number: [id number] Date of Birth/Sex: Treating RN: 1951-02-26 (69 y.o. [name] Primary Care Provider: Dustin [name] Other Clinician: Referring Provider: Treating Provider/Extender: [name] in Treatment: 128 Diagnosis Coding ICD-10 Codes Code Description E11.622 Type 2 diabetes mellitus with other skin ulcer I89.0 Lymphedema, not elsewhere classified I87.331 Chronic venous hypertension (idiopathic) with ulcer and inflammation of right lower extremity I87.332 Chronic venous hypertension (idiopathic) with ulcer and inflammation of left lower extremity L97.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 leg with fat layer exposed L97.822 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 I10 Essential (primary)  hypertension E66.01 Morbid (severe) obesity due to excess calories F41.8 Other specified anxiety disorders R53.1 Weakness Facility Procedures The patient participates with Medicare or their insurance follows the Medicare Facility Guidelines: CPT4 Description Modifier Quantity Code [id number] BILATERAL: Application of multi-layer venous compression system; leg (below knee), including ankle and 1 foot. Physician Procedures : CPT4 Code Description Modifier [id number] - WC PHYS LEVEL 3 - EST PT ICD-10 Diagnosis Description E11.622 Type 2 diabetes mellitus with other skin ulcer I89.0 Lymphedema, not elsewhere classified I87.331 Chronic venous hypertension (idiopathic) with  ulcer and inflammation of right lower extremity I87.332 Chronic venous hypertension (idiopathic) with ulcer and inflammation of left lower extremity Quantity: 1 Electronic Signature(s) Signed: 01/18/2020 11:28:22 AM By: [name] PA-C Entered By: [name] on 01/18/2020 11:28:19

## 2020-01-19 NOTE — Progress Notes (Signed)
MONE, COMMISSO ([id number]) Visit Report for 01/18/2020 Arrival Information Details Patient Name: Date of Service: [name], [name] 01/18/2020 10:00 A M Medical Record Number: [id number] Patient Account Number: [id number] Date of Birth/Sex: Treating RN: 11-22-1950 (69 y.o. [name], [name] Primary Care [name]: [name] Other Clinician: Referring Jaquay Posthumus: Treating [name]/Extender: [name] [name] in Treatment: 128 Visit Information History Since Last Visit Added or deleted any medications: No Patient Arrived: Wheel Chair Any new allergies or adverse reactions: No Arrival Time: 10:09 Had a fall or experienced change in No Accompanied By: self activities of daily living that may affect Transfer Assistance: None risk of falls: Patient Identification Verified: Yes Signs or symptoms of abuse/neglect since last visito No Secondary Verification Process Completed: Yes Hospitalized since last visit: No Patient Requires Transmission-Based Precautions: No Implantable device outside of the clinic excluding No Patient Has Alerts: Yes cellular tissue based products placed in the center Patient Alerts: R ABI= 1.01 since last visit: L ABI = .99 Has Dressing in Place as Prescribed: Yes Pain Present Now: Yes Electronic Signature(s) Signed: 01/18/2020 10:37:40 AM By: [name] Entered By: [name] on 01/18/2020 10:10:12 -------------------------------------------------------------------------------- Compression Therapy Details Patient Name: Date of Service: [name]. 01/18/2020 10:00 A M Medical Record Number: [id number] Patient Account Number: [id number] Date of Birth/Sex: Treating RN: December 11, 1950 (69 y.o. [name] Primary Care [name]: [name] Other Clinician: Referring [name]: Treating [name]/Extender: [name] [name] in Treatment: 128 Compression Therapy Performed for Wound Assessment: Wound #65  Right,Medial Lower Leg Performed By: Clinician [name] Coria, RN Compression Type: Three Layer Post Procedure Diagnosis Same as Pre-procedure Electronic Signature(s) Signed: 01/18/2020 4:54:27 PM By: [name] Gouty RN, BSN Entered By: [name] on 01/18/2020 11:06:10 -------------------------------------------------------------------------------- Compression Therapy Details Patient Name: Date of Service: [name]. 01/18/2020 10:00 A M Medical Record Number: [id number] Patient Account Number: [id number] Date of Birth/Sex: Treating RN: 1950/09/30 (69 y.o. [name] Primary Care [name]: [name] Other Clinician: Referring [name]: Treating [name]/Extender: [name] [name] in Treatment: 128 Compression Therapy Performed for Wound Assessment: Wound #61 Left,Circumferential Lower Leg Performed By: Clinician [name] Coria, RN Compression Type: Three Layer Post Procedure Diagnosis Same as Pre-procedure Electronic Signature(s) Signed: 01/18/2020 4:54:27 PM By: [name] Gouty RN, BSN Entered By: [name] on 01/18/2020 11:06:11 -------------------------------------------------------------------------------- Encounter Discharge Information Details Patient Name: Date of Service: [name]. 01/18/2020 10:00 A M Medical Record Number: [id number] Patient Account Number: [id number] Date of Birth/Sex: Treating RN: 26-Nov-1950 (69 y.o. [name] Primary Care Lelar Farewell: [name] Other Clinician: Referring Shervin Cypert: Treating Monterrio Gerst/Extender: [name] [name] in Treatment: [phone] Encounter Discharge Information Items Discharge Condition: Stable Ambulatory Status: Wheelchair Discharge Destination: Home Transportation: Private Auto Accompanied By: self Schedule Follow-up Appointment: Yes Clinical Summary of Care: Patient Declined Electronic Signature(s) Signed: 01/18/2020 4:35:51 PM By: [name] Coria RN Entered  By: [name] on 01/18/2020 11:37:26 -------------------------------------------------------------------------------- Lower Extremity Assessment Details Patient Name: Date of Service: [name] 01/18/2020 10:00 A M Medical Record Number: [id number] Patient Account Number: [id number] Date of Birth/Sex: Treating RN: [birth year] (69 y.o. [name] Primary Care [name]: [name] Other Clinician: Referring [name]: Treating Celise Bazar/Extender: [name] [name] in Treatment: 128 Edema Assessment Assessed: [Left: No] [Right: No] Edema: [Left: Yes] [Right: Yes] Calf Left: Right: Point of Measurement: 37 cm From Medial Instep 43 cm 37 cm Ankle Left: Right: Point of Measurement: 10 cm From Medial Instep 29.8 cm 23.5  cm Vascular Assessment Pulses: Dorsalis Pedis Palpable: [Left:Yes] [Right:Yes] Electronic Signature(s) Signed: 01/19/2020 5:21:32 PM By: [name] Hurst RN, BSN Entered By: [name] on 01/18/2020 10:31:41 -------------------------------------------------------------------------------- [place] Details Patient Name: Date of Service: [name]. 01/18/2020 10:00 A M Medical Record Number: [id number] Patient Account Number: [id number] Date of Birth/Sex: Treating RN: 1950/08/14 (69 y.o. [name] Primary Care Lovette Merta: [name] Other Clinician: Referring [name]: Treating [name]/Extender: [name] [name] in Treatment: [phone] Active Inactive Venous Leg Ulcer Nursing Diagnoses: Actual venous Insuffiency (use after diagnosis is confirmed) Knowledge deficit related to disease process and management Goals: Patient will maintain optimal edema control Date Initiated: 08/12/2017 Target Resolution Date: 02/15/2020 Goal Status: Active Patient/caregiver will verbalize understanding of disease process and disease management Date Initiated: 08/12/2017 Date Inactivated: 04/21/2018 Target  Resolution Date: 04/24/2018 Goal Status: Met Interventions: Assess peripheral edema status every visit. Compression as ordered Treatment Activities: Therapeutic compression applied : 08/12/2017 Notes: Wound/Skin Impairment Nursing Diagnoses: Impaired tissue integrity Knowledge deficit related to ulceration/compromised skin integrity Goals: Patient/caregiver will verbalize understanding of skin care regimen Date Initiated: 08/12/2017 Target Resolution Date: 02/15/2020 Goal Status: Active Ulcer/skin breakdown will have a volume reduction of 30% by week 4 Date Initiated: 08/05/2017 Date Inactivated: 09/30/2017 Target Resolution Date: 10/03/2017 Goal Status: Met Ulcer/skin breakdown will have a volume reduction of 50% by week 8 Date Initiated: 09/30/2017 Date Inactivated: 10/28/2017 Target Resolution Date: 10/28/2017 Goal Status: Met Interventions: Assess patient/caregiver ability to perform ulcer/skin care regimen upon admission and as needed Assess ulceration(s) every visit Provide education on ulcer and skin care Screen for HBO Treatment Activities: Patient referred to home care : 08/05/2017 Skin care regimen initiated : 08/05/2017 Topical wound management initiated : 08/05/2017 Notes: Electronic Signature(s) Signed: 01/18/2020 4:54:27 PM By: [name] Gouty RN, BSN Entered By: [name] on 01/18/2020 11:04:40 -------------------------------------------------------------------------------- Pain Assessment Details Patient Name: Date of Service: [name]. 01/18/2020 10:00 A M Medical Record Number: [id number] Patient Account Number: [id number] Date of Birth/Sex: Treating RN: 07-Sep-1950 (69 y.o. [name] Primary Care Yuta Cipollone: [name] Other Clinician: Referring Carleta Woodrow: Treating Jersie Beel/Extender: [name] [name] in Treatment: 128 Active Problems Location of Pain Severity and Description of Pain Patient Has Paino Yes Site  Locations Rate the pain. Current Pain Level: 6 Pain Management and Medication Current Pain Management: Electronic Signature(s) Signed: 01/18/2020 10:37:40 AM By: [name] Signed: 01/18/2020 4:54:27 PM By: [name] Gouty RN, BSN Entered By: [name] on 01/18/2020 10:11:10 -------------------------------------------------------------------------------- Patient/Caregiver Education Details Patient Name: Date of Service: Kaufmann, Bristyn M. 8/4/2021andnbsp10:00 A M Medical Record Number: [id number] Patient Account Number: [id number] Date of Birth/Gender: Treating RN: January 02, 1951 (69 y.o. [name] Primary Care Physician: [name] Other Clinician: Referring Physician: Treating Physician/Extender: [name] [name] in Treatment: 128 Education Assessment Education Provided To: Patient Education Topics Provided Venous: Methods: Explain/Verbal Responses: Reinforcements needed, State content correctly Wound/Skin Impairment: Methods: Explain/Verbal Responses: Reinforcements needed, State content correctly Electronic Signature(s) Signed: 01/18/2020 4:54:27 PM By: [name] Gouty RN, BSN Entered By: [name] on 01/18/2020 11:05:16 -------------------------------------------------------------------------------- Wound Assessment Details Patient Name: Date of Service: [name]. 01/18/2020 10:00 A M Medical Record Number: [id number] Patient Account Number: [id number] Date of Birth/Sex: Treating RN: 06-17-1950 (69 y.o. [name] Primary Care Chuckie Mccathern: [name] Other Clinician: Referring Kessie Croston: Treating Taytum Wheller/Extender: [name] [name] in Treatment: 128 Wound Status Wound Number: 61 Primary Venous Leg Ulcer Etiology: Wound Location: Left, Circumferential Lower Leg Wound Open  Wounding Event: Gradually Appeared Status: Date Acquired: 04/20/2019 Comorbid Asthma, Hypertension, Peripheral Arterial  Disease, Peripheral [name] Of Treatment: 39 History: Venous Disease, Type II Diabetes, Gout, Osteoarthritis Clustered Wound: Yes Photos Photo Uploaded By: Mikeal Hawthorne on 01/19/2020 13:11:50 Wound Measurements Length: (cm) 11.5 Width: (cm) 23 Depth: (cm) 0.1 Clustered Quantity: 3 Area: (cm) 207.738 Volume: (cm) 20.774 % Reduction in Area: -2655.1% % Reduction in Volume: -2655.2% Epithelialization: Small (1-33%) Tunneling: No Undermining: No Wound Description Classification: Full Thickness Without Exposed Support Structures Wound Margin: Flat and Intact Exudate Amount: Large Exudate Type: Serous Exudate Color: amber Foul Odor After Cleansing: No Slough/Fibrino Yes Wound Bed Granulation Amount: Medium (34-66%) Exposed Structure Granulation Quality: Pink, Pale Fascia Exposed: No Necrotic Amount: Medium (34-66%) Fat Layer (Subcutaneous Tissue) Exposed: Yes Necrotic Quality: Adherent Slough Tendon Exposed: No Muscle Exposed: No Joint Exposed: No Bone Exposed: No Treatment Notes Wound #61 (Left, Circumferential Lower Leg) 1. Cleanse With Wound Cleanser Soap and water 2. Periwound Care Barrier cream Moisturizing lotion 3. Primary Dressing Applied Calcium Alginate Ag 4. Secondary Dressing ABD Pad Dry Gauze 6. Support Layer Applied 3 layer compression wrap Notes stocknette Electronic Signature(s) Signed: 01/18/2020 4:54:27 PM By: [name] Gouty RN, BSN Signed: 01/19/2020 5:21:32 PM By: [name] Hurst RN, BSN Entered By: [name] on 01/18/2020 10:32:04 -------------------------------------------------------------------------------- Wound Assessment Details Patient Name: Date of Service: [name]. 01/18/2020 10:00 A M Medical Record Number: [id number] Patient Account Number: [id number] Date of Birth/Sex: Treating RN: [birth year] (69 y.o. [name] Primary Care Sherica Paternostro: [name] Other Clinician: Referring Jak Haggar: Treating  Tyreck Bell/Extender: [name] [name] in Treatment: 128 Wound Status Wound Number: 64 Primary Diabetic Wound/Ulcer of the Lower Extremity Etiology: Wound Location: Left, Dorsal Foot Wound Open Wounding Event: Gradually Appeared Status: Date Acquired: 06/01/2019 Date Acquired: 06/01/2019 Comorbid Asthma, Hypertension, Peripheral Arterial Disease, Peripheral [name] Of Treatment: 33 History: Venous Disease, Type II Diabetes, Gout, Osteoarthritis Clustered Wound: No Photos Photo Uploaded By: Mikeal Hawthorne on 01/19/2020 13:11:51 Wound Measurements Length: (cm) 6.8 Width: (cm) 6.7 Depth: (cm) 0.1 Area: (cm) 35.783 Volume: (cm) 3.578 % Reduction in Area: -21586.7% % Reduction in Volume: -7202% Epithelialization: Small (1-33%) Tunneling: No Undermining: No Wound Description Classification: Grade 1 Wound Margin: Flat and Intact Exudate Amount: Medium Exudate Type: Serous Exudate Color: amber Foul Odor After Cleansing: No Slough/Fibrino Yes Wound Bed Granulation Amount: Large (67-100%) Exposed Structure Granulation Quality: Pink, Pale Fascia Exposed: No Necrotic Amount: Small (1-33%) Fat Layer (Subcutaneous Tissue) Exposed: Yes Necrotic Quality: Adherent Slough Tendon Exposed: No Muscle Exposed: No Joint Exposed: No Bone Exposed: No Treatment Notes Wound #64 (Left, Dorsal Foot) 1. Cleanse With Wound Cleanser Soap and water 2. Periwound Care Barrier cream Moisturizing lotion 3. Primary Dressing Applied Calcium Alginate Ag 4. Secondary Dressing ABD Pad Dry Gauze 6. Support Layer Applied 3 layer compression wrap Notes stocknette Electronic Signature(s) Signed: 01/18/2020 4:54:27 PM By: [name] Gouty RN, BSN Signed: 01/19/2020 5:21:32 PM By: [name] Hurst RN, BSN Entered By: [name] on 01/18/2020 10:32:30 -------------------------------------------------------------------------------- Wound Assessment Details Patient Name: Date of  Service: [name]. 01/18/2020 10:00 A M Medical Record Number: [id number] Patient Account Number: [id number] Date of Birth/Sex: Treating RN: 12/10/1950 (69 y.o. [name] Primary Care Irania Durell: [name] Other Clinician: Referring Kitrina Maurin: Treating Marge Vandermeulen/Extender: [name] [name] in Treatment: 128 Wound Status Wound Number: 65 Primary Venous Leg Ulcer Etiology: Wound Location: Right, Medial Lower Leg Wound Open Wounding Event: Gradually Appeared Status: Date Acquired: 09/28/2019 Comorbid Asthma, Hypertension, Peripheral  Arterial Disease, Peripheral [name] Of Treatment: 16 History: Venous Disease, Type II Diabetes, Gout, Osteoarthritis Clustered Wound: No Photos Photo Uploaded By: Mikeal Hawthorne on 01/19/2020 13:05:18 Wound Measurements Length: (cm) 0.6 Width: (cm) 0.9 Depth: (cm) 0.1 Area: (cm) 0.424 Volume: (cm) 0.042 % Reduction in Area: 61.5% % Reduction in Volume: 61.8% Epithelialization: Medium (34-66%) Tunneling: No Undermining: No Wound Description Classification: Full Thickness Without Exposed Support Structu Wound Margin: Distinct, outline attached Exudate Amount: Medium Exudate Type: Serosanguineous Exudate Color: red, brown res Foul Odor After Cleansing: No Slough/Fibrino No Wound Bed Granulation Amount: Large (67-100%) Exposed Structure Granulation Quality: Pink Fascia Exposed: No Necrotic Amount: None Present (0%) Fat Layer (Subcutaneous Tissue) Exposed: Yes Tendon Exposed: No Muscle Exposed: No Joint Exposed: No Bone Exposed: No Treatment Notes Wound #65 (Right, Medial Lower Leg) 1. Cleanse With Wound Cleanser Soap and water 2. Periwound Care Barrier cream Moisturizing lotion 3. Primary Dressing Applied Calcium Alginate Ag 4. Secondary Dressing ABD Pad Dry Gauze 6. Support Layer Applied 3 layer compression wrap Notes stocknette Electronic Signature(s) Signed: 01/18/2020 4:54:27 PM By: [name] Gouty RN, BSN Signed: 01/19/2020 5:21:32 PM By: [name] Hurst RN, BSN Entered By: [name] on 01/18/2020 10:33:00 -------------------------------------------------------------------------------- Vitals Details Patient Name: Date of Service: [name]. 01/18/2020 10:00 A M Medical Record Number: [id number] Patient Account Number: [id number] Date of Birth/Sex: Treating RN: 26-Mar-1951 (69 y.o. [name] Primary Care Ambermarie Honeyman: [name] Other Clinician: Referring Ladona Rosten: Treating Jaylen Claude/Extender: [name] [name] in Treatment: 128 Vital Signs Time Taken: 10:10 Temperature (F): 97.9 Height (in): 62 Pulse (bpm): 92 Weight (lbs): 335 Respiratory Rate (breaths/min): 18 Body Mass Index (BMI): 61.3 Blood Pressure (mmHg): 130/81 Capillary Blood Glucose (mg/dl): 126 Reference Range: 80 - 120 mg / dl Electronic Signature(s) Signed: 01/18/2020 10:37:40 AM By: [name] Entered By: [name] on 01/18/2020 10:11:00

## 2020-02-01 ENCOUNTER — Encounter (HOSPITAL_BASED_OUTPATIENT_CLINIC_OR_DEPARTMENT_OTHER): Payer: Medicare PPO | Admitting: Physician Assistant

## 2020-02-01 DIAGNOSIS — E11622 Type 2 diabetes mellitus with other skin ulcer: Secondary | ICD-10-CM | POA: Diagnosis not present

## 2020-02-01 NOTE — Progress Notes (Addendum)
[name], [name] ([id number]) Visit Report for 02/01/2020 Chief Complaint Document Details Patient Name: Date of Service: [name], [name] 02/01/2020 10:45 A M Medical Record Number: [id number] Patient Account Number: [id number] Date of Birth/Sex: Treating RN: 08/15/1950 (69 y.o. [name] Primary Care Provider: [name] Other Clinician: Referring Provider: Treating Provider/Extender: Doyle Askew, FRED Weeks in Treatment: [phone] Information Obtained from: Patient Chief Complaint Bilateral reoccurring LE ulcers Electronic Signature(s) Signed: 02/01/2020 10:51:25 AM By: [name] PA-C Entered By: [name] on 02/01/2020 10:51:25 -------------------------------------------------------------------------------- HPI Details Patient Name: Date of Service: [name]. 02/01/2020 10:45 A M Medical Record Number: [id number] Patient Account Number: [id number] Date of Birth/Sex: Treating RN: Nov 02, 1950 (69 y.o. [name] Primary Care Provider: [name] Other Clinician: Referring Provider: Treating Provider/Extender: Doyle Askew, FRED Weeks in Treatment: 130 History of Present Illness HPI Description: this patient has been seen a couple of times before and returns with recurrent problems to her right and left lower extremity with swelling and weeping ulcerations due to not wearing her compression stockings which she had been advised to do during her last discharge, at the end of June 2018. During her last visit the patient had had normal arterial blood flow and her venous reflux study did not necessitate any surgical intervention. She was recommended compression and elevation and wound care. After prolonged treatment the patient was completely healed but she has been noncompliant with wearing or compressions.. She was here last week with an outpatient return visit planned but the patient came in a very poor general condition with altered  mental status and was rushed to the ER on my request. With a history of hypertension, diabetes, TIA and right-sided weakness she was set up for an MRI on her brain and cervical spine and was sent to [facility]. Getting an MRI done was very difficult but once the workup was done she was found not to have any spinal stenosis, epidural abscess or hematoma or discitis. This was radiculopathy to be treated as an outpatient and she was given a follow-up appointment. Today she is feeling much better alert and oriented and has come to reevaluate her bilateral lower extremity lymphedema and ulceration 03/25/2017 -- she was admitted to the hospital on 03/16/2017 and discharged on 03/18/2017 with left leg cellulitis and ulceration. She was started on vancomycin and Zosyn and x-ray showed no bony involvement. She was treated for a cellulitis with IV antibiotics changed to Rocephin and Flagyl and was discharged on oral Keflex and doxycycline to complete a 7 day course. Last hemoglobin A1c was 7.1 and her other ailments including hypertension got asthma were appropriately treated. 05/06/2017 -- she is awaiting the right size of compression stockings from [place] but other than that has been doing well. ====== Old notes 69 year old patient was seen one time last October and was lost to follow-up. She has recurrent problems with weeping and ulceration of her left lower extremity and has swelling of this for several years. It has been worse for the last 2 months. Past medical history is significant for diabetes mellitus type 2, hypertension, gout, morbid obesity, depressive disorders, hiatal hernia, migraines, status post knee surgery, risk of a cholecystectomy, vaginal hysterectomy and breast biopsy. She is not a smoker. As noted before she has never had a venous duplex study and an arterial ABI study was attempted but the left lower extremity was noncompressible 10/01/2016 -- had a lower extremity venous duplex  reflux evaluation which showed  no evidence of deep vein reflux in the right or left lower extremity, and no evidence of great saphenous vein reflux more than 500 ms in the right or left lower extremity, and the left small saphenous vein is incompetent but no vascular consult was recommended. review of her electronic medical records noted that the ABI was checked in July 2017 where the right ABI was normal limits and the left ABI could not be ascertained due to pain with cuff pressure but the waveforms are within normal limits. her arterial duplex study scheduled for April 27. 10/08/2016 -- the patient has various reasons for not having a compression on and for the last 3 days she has had no compression on her left lower extremity either due to pain or the lack of nursing help. She does not use her juxta lites either. 10/15/2016 -- the patient did not keep her appointment for arterial duplex study on April 27 and I have asked her to reschedule this. Her pain is out of proportion with the physical findings and she continuously fails to wear a compression wraps and cuts them off because she says she cannot tolerate the pain. She does not use her juxta lites either. 10/22/2016 -- he has rescheduled her arterial duplex study to May 21 and her pain today is a bit better. She has not been wearing her juxta lites on her right lower extremity but now understands that she needs to do this. She did tolerate the to press compression wrap on her left lower extremity 10/29/2016 --arterial duplex study is scheduled for next week and overall she has been tolerating her compression wraps and also using her juxta lites on her right lower extremity 11/05/2016 -- the right ABI was 0.95 the left was 1.03. The digit TBI is on the right was 0.83 on the left was 0.92 and she had biphasic flow through these vessels. The impression was that of normal lower extremity arterial study. 11/12/2016 -- her pain is minimal and she  is doing very well overall. 11/26/2016 -- she has got juxta lites and her insurance will not pay for additional dual layer compression stockings. She is going to order some from [place]. 05/12/2017 -- her juxta lites are very old and too big for her and these have not been helping with compression. She did get 20-30 mm compression stockings from [facility] but she and her husband are unable to put these on. I believe she will benefit from bilateral Extremit-ease, compression stockings and we will measure her for these today. 05/20/2017 -- lymphedema on the left lower extremity has increased a lot and she has a open ulceration as a result of this. The right lower extremity is looking pretty good. She has decided to by the compression stockings herself and will get reimbursed by the home health, at a later date. 05/27/2017 -- her sciatica is bothering her a lot and she thought her left leg pain was caused due to the compression wrap and hence removed it and has significant lymphedema. There is no inflammation on this left lower extremity. 06/17/17 on evaluation today patient appears to be doing very well and in fact is completely healed in regard to her ulcerations. Unfortunately however she does have continued issues with lymphedema nonetheless. We did order compression garments for her unfortunately she states that the size that she received were large although we ordered medium. Obviously this means she is not getting the optimal compression. She does not have those with her today and therefore  we could not confirm and contact the company on her behalf. Nonetheless she does state that she is going to have her husband bring them by tomorrow so that we can verify and then get in touch with the company. No fevers, chills, nausea, or vomiting noted at this time. Overall patient is doing better otherwise and I'm pleased with the progress she has made. 07/01/17 on evaluation today patient appears to be doing  very well in regard to her bilateral lower extremity she does not have any openings at this point which is excellent news. Overall I'm pleased with how things have progressed up to this time. Since she is doing so well we did order her compression which we are seeing her today to ensure that it fits her properly and everything is doing well in that regard and then subsequently she will be discharged. ============ Old Notes: 03/31/16 patient presents today for evaluation concerning open wounds that she has over the left medial ankle region as well as the left dorsal foot. She has previously had this occur although it has been healed for a number of months after having this for about a year prior until her hospitalization on 01/05/16. At that point in time it appears that she was admitted to the hospital for left lower extremity cellulitis and was placed on vancomycin and Zosyn at that point. Eventually upon discharge on January 15, 2016 she was placed on doxycycline at that point in time. Later on 03/27/16 positive wound culture growing Escherichia coli this was switched to amoxicillin. Currently she tells me that she is having pain radiated to be a 7 out of 10 which can be as high as 10 out of 10 with palpation and manipulation of the wound. This wound appears to be mainly venous in nature due to the bilateral lower extremity venous stasis/lymphedema. This is definitely much worse on her left than the right side. She does have type 1 diabetes mellitus, hypertension, morbid obesity, and is wheelchair dependent.during the course of the hospital stay a blood culture was also obtained and fortunately appeared negative. She also had an x-ray of the tibia/fibula on the left which showed no acute bone abnormality. Her white blood cell count which was performed last on 03/25/16 was 7.3, hemoglobin 12.8, protein 7.1, albumin 3.0. Her urine culture appeared to be negative for any specific organisms. Patient did  have a left lower extremity venous duplex evaluation for DVT . This did not include venous reflux studies but fortunately was negative for DVT Patient also had arterial studies performed which revealed that she had a . normal ABI on the right though this was unable to be performed on the left secondary to pain that she was having around the ankle region due to the wound. However it was stated on report that she had biphasic pulses and apparently good blood flow. ========== 06/03/17 she is here in follow-up evaluation for right lower extremity ulcer. The right lower sure he has healed but she has reopened to the left medial malleolus and dorsal foot with weeping. She is waiting for new compression garments to arrive from home health, the previous compression garments were ill fitting. We will continue with compression bilaterally and follow-up in 2 weeks Readmission: 08/05/17 on evaluation today patient appears to be doing somewhat poorly in regard to her left lower extremity especially although the right lower extremity has a small area which may no longer be open. She has been having a lot of drainage from the  left lower extremity however he tells me that she has not been able to use the EXTREMIT-EASE Compression at this point. She states that she did better and was able to actually apply the Juxta-Lite compression although the wound that she has is too large and therefore really does not compress which is why she cannot wear it at this point. She has no one who can help her put it on regular basis her son can sometimes but he's not able to do it most of the time. I do believe that's why she has begun to weave and have issues as she is currently yet again. No fevers, chills, nausea, or vomiting noted at this time. Patient is no evidence of dementia. 08/12/17 on evaluation today patient appears to still be doing fairly well in regard to the draining areas/weeping areas at this point. With that being  said she unfortunately did go to the ER yesterday due to what was felt to be possibly a cellulitis. They place her on doxycycline by mouth and discharge her home. She definitely was not admitted. With that being said she states she has had more discomfort which has been unusual for her even compared to prior times and she's had infections.08/12/17 on evaluation today patient appears to still be doing fairly well in regard to the draining areas/weeping areas at this point. With that being said she unfortunately did go to the ER yesterday due to what was felt to be possibly a cellulitis. They place her on doxycycline by mouth and discharge her home. She definitely was not admitted. With that being said she states she has had more discomfort which has been unusual for her even compared to prior times and she's had infections. 08/19/17 put evaluation today patient tells me that she's been having a lot of what sounds to be neuropathic type pain in regard to her left lower extremity. She has been using over-the-counter topical bins again which some believe. That in order to apply the she actually remove the wrap we put on her last Wednesday on Thursday. Subsequently she has not had anything on compression wise since that time. The good news is a lot of the weeping areas appear to have closed at this point again I believe she would do better with compression but we are struggling to get her to actually use what she needs to at this point. No fevers, chills, nausea, or vomiting noted at this time. 09/03/17 on evaluation today patient appears to be doing okay in regard to her lower extremities in regard to the lymphedema and weeping. Fortunately she does not seem to show any signs of infection at this point she does have a little bit of weeping occurring in the right medial malleolus area. With that being said this does not appear to be too significant which is good news. 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 She has severe skin damage secondary to both of these features involving the dorsal left foot and medial left ankle and lower leg. Still has open areas in the left anterior foot. The area on the right closed over. She uses her own juxta light stockings. She does not have an arterial issue 09/16/17 on evaluation today patient actually appears to be doing excellent in regard to her bilateral lower extremity swelling. The Juxta-Lite compression wrap seem to be doing very well for her. She has not however been using the portion that goes over her foot.  Her left foot still is draining a little bit not nearly as significant as it has been in the past but still I do believe that she likely needs to utilize the full wrap including the foot portion of this will improve as well. She also has been apparently putting on a significant amount of Vaseline which also think is not helpful for her. I recommended that if she feels she needs something for moisturizer Eucerin will probably be better. 09/30/17 on evaluation today patient presents with several new open areas in regard to her left lower extremity although these appear to be minimal and mainly seem to be more moisture breakdown than anything. Fortunately she does not seem to have any evidence of infection which is great news. She has been tolerating the dressing changes without complication we are using silver alginate on the foot she has been using AB pads to have the legs and using her Juxta- Lite compression which seems to be controlling her swelling very well. Overall I'm pleased with the poor way she has progressed. 10/14/17 on evaluation today patient appears to be doing better in regard to her left lower extremity areas of weeping. She does still have some discomfort although in general this does not appear to be as macerated and I think it is progressing nicely. I do think she still  needs to wear the foot portion of her Juxta- Lite in order to get the most benefit from the wrap obviously. She states she understands. Fortunately there does not appear to be evidence of infection at this time which is great news. 10/28/17 on evaluation today patient appears to be doing excellent in regard to her left lower extremity. She has just a couple areas that are still open and seem to be causing any trouble whatsoever. For that reason I think that she is definitely headed in the right direction the spots are very tiny compared to what we have been dealing with in the past. 11/11/17 on evaluation today patient appears to have a right lateral lower extremity ulcer that has opened since I last saw her. She states this is where the home health nurse that was coming out remove the dressing without wetting the alginate first. Nonetheless I do not know if this is indeed the case or not but more importantly we have not ordered home help to be coming out for her wounds at all. I'm unsure as to why they are coming out and we're gonna have to check on this and get things situated in that regard. With that being said we currently really do not need them to be coming out as the patient has been taking care of her leg herself without complication and no issues. In fact she was doing much better prior to nursing coming out. 11/25/17 on evaluation today patient actually appears to be doing fairly well in regard to her left lower extremity swelling. In fact she has very little area of weeping at this point there's just a small spot on the lateral portion of her right leg that still has me just a little bit more concerned as far as wanting to see this clear up before I discharge her to caring for this at home. Nonetheless overall she has made excellent progress. 12/09/17 on evaluation today patient appears to be doing rather well in regard to her lower extremity edema. She does have some weeping still in the left  lower extremity although the big area we were taking care of two  weeks ago actually has closed and she has another area of weeping on the left lower extremity immediately as well is the top of her foot. She does not currently have lymphedema pumps she has been wearing her compression daily on a regular basis as directed. With that being said I think she may benefit from lymphedema pumps. 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 We had a very difficult time getting and keeping this under control. 12/23/17 on evaluation today patient actually appears to be doing a little bit more poorly in regard to her bilateral lower extremities. She has been tolerating the Juxta-Lite compression wraps. Unfortunately she has two new ulcers on the right lower extremity and left lower Trinity ulceration seems to be larger. Obviously this is not good news. She has been tolerating the dressings without complication. 12/30/17 on evaluation today patient actually appears to be doing much better in regard to her bilateral lower extremity edema. She continues to have some issues with ulcerations and in fact there appears to be one spot on each leg where the wrap may have caused a little bit of a blister which is subsequently opened up at this point is given her pain. Fortunately it does not appear to be any evidence of infection which is good news. No fevers chills noted. 01/13/18 on evaluation today patient appears to be doing rather well in regard to her bilateral lower extremities. The dressings did get kind of stuck as far as the wound beds are concerned but again I think this is mainly due to the fact that she actually seems to be showing signs of healing which is good news. She's not having as much drainage therefore she was having more of the dressing sticking.  Nonetheless overall I feel like her swelling is dramatically down compared to previous. 01/20/18 on evaluation today patient unfortunately though she's doing better in most regards has a large blister on the left anterior lower extremity where she is draining quite significantly. Subsequently this is going to need debridement today in order to see what's underneath and ensure she does not continue to trapping fluid at this location. Nonetheless No fevers, chills, nausea, or vomiting noted at this time. 01/27/18 on evaluation today patient appears to be doing rather well at this point in regard to her right lower extremity there's just a very small area that she still has open at this point. With that being said I do believe that she is tolerating the compression wraps very well in making good progress. Home health is coming out at this point to see her. Her left lower extremity on the lateral portion is actually what still mainly open and causing her some discomfort for the most part 02/10/18 on evaluation today patient actually appears to be doing very well in regard to her right lower extremity were all the ulcers appear to be completely close. In regard to the left lower extremity she does have two areas still open and some leaking from the dorsal surface of her foot but this still seems to be doing much better to me in general. 02/24/18 on evaluation today patient actually appears to be doing much better in regard to her right lower extremity this is still completely healed. Her left lower extremity is also doing much better fortunately she has no evidence of infection. The one  area that is gonna require some debridement is still on the left anterior shin. Fortunately this is not hurting her as badly today. 03/10/18 on evaluation today patient appears to be doing better in some regards although she has a little bit more open area on the dorsal foot and she also has some issues on the medial portion of  the left lower extremity which is actually new and somewhat deep. With that being said there fortunately does not appear to be any significant signs of infection which is good news. No fevers, chills, nausea, or vomiting noted at this time. In general her swelling seems to be doing fairly well which is good news. 03/31/18 on evaluation today patient presents for follow-up concerning her left lower extremity lymphedema. Unfortunately she has been doing a little bit more poorly since I last saw her in regard to the amount of weeping that she is experiencing. She's also having some increased pain in the anterior shin location. Unfortunately I do not feel like the patient is making such good progress at this point a few weeks back she was definitely doing much better. 04/07/18 on evaluation today patient actually appears to be showing some signs of improvement as far as the left lower extremity is concerned. She has been tolerating the dressing changes and it does appear that the Drawtex did better for her. With that being said unfortunately home health is stating that they cannot obtain the Drawtex going forward. Nonetheless we're gonna have to check and see what they may be able to get the alginate they were using was getting stuck in causing new areas of skin being pulled all that with and subsequently weep and calls her to worsen overall this is the first time we've seen improvement at this time. 04/14/18 on evaluation today patient actually appears to be doing rather well at this point there does not appear to be any evidence of infection at this time and she is actually doing excellent in regard to the weeping in fact she almost has no openings remaining even compared to just last week this is a dramatic improvement. No fevers chills noted 04/21/18 evaluation today patient actually appears to be doing very well. She in fact is has a small area on the posterior lower extremity location and she has  a small area on the dorsal surface of her foot that are still open both of which are very close to closing. We're hoping this will be close shortly. She brought her Juxta-Lite wrap with her today hoping that would be able to put her in it unfortunately I don't think were quite at that point yet but we're getting closer. 04/28/18 upon evaluation today patient actually appears to be doing excellent in regard to her left lower extremity ulcer. In fact the region on the posterior lower extremity actually is much smaller than previously noted. Overall I'm very happy with the progress she has made. She again did bring her Juxta-Lite although we're not quite ready for that yet. 05/11/18 upon evaluation today patient actually appears to be doing in general fairly well in regard to her left lower Trinity. The swelling is very well controlled. With that being said she has a new area on the left anterior lower extremity as well as between the first and second toes of her left foot that was not present during the last evaluation. The region of her posterior left lower extremity actually appears to be almost completely healed. T be honest I'm very pleased  with o the way that stands. Nonetheless I do believe that the lotion may be keeping the area to moist as far as her legs are concerned subsequently I'm gonna consider discontinuing that today. 05/26/18 on evaluation today patient appears to be doing rather well in regard to her left lower should be ulcers. In fact everything appears to be close except for a very small area on the left posterior lower extremity. Fortunately there does not appear to be any evidence of infection at this time. Overall very pleased with her progress. 06/02/18 and evaluation today patient actually appears to be doing very well in regard to her lower extremity ulcers. She has one small area that still continues to weep that I think may benefit her being able to justify lotion and user  Juxta-Lite wraps versus continued to wrap her. Nonetheless I think this is something we can definitely look into at this point. 06/23/18 on evaluation today patient unfortunately has openings of her bilateral lower extremities. In general she seems to be doing much worse than when I last saw her just as far as her overall health standpoint is concerned. She states that her discomfort is mainly due to neuropathy she's not having any other issues otherwise. No fevers, chills, nausea, or vomiting noted at this time. 06/30/18 on evaluation today patient actually appears to be doing a little worse in regard to her right lower extremity her left lower extremity of doing fairly well. Fortunately there is no sign of infection at this time. She has been tolerating the dressing changes without complication. Home health did not come out like they were supposed to for the appropriate wrap changes. They stated that they never received the orders from [place] which were fax. Nonetheless we will send a copy of the orders with the patient today as well. 07/07/18 on evaluation today patient appears to be doing much better in regard to lower extremities. She still has several openings bilaterally although since I last saw her her legs did show obvious signs of infection when she later saw her nurse. Subsequently a culture was obtained and she is been placed on Bactrim and Keflex. Fortunately things seem to be looking much better it does appear she likely had an infection. Again last week we'd even discussed it but again there really was not any obvious sign that she had infection therefore we held off on the antibiotics. Nonetheless I'm glad she's doing better today. 07/14/18 on evaluation today patient appears to be doing much better regarding her bilateral lower Trinity's. In fact on the right lower for me there's nothing open at this point there are some dry skin areas at the sites where she had infection. Fortunately there is  no evidence of systemic infection which is excellent news. No fevers chills noted 07/21/18 on evaluation today patient actually appears to be doing much better in regard to her left lower extremity ulcers. She is making good progress and overall I feel like she's improving each time I see her. She's having no pain I do feel like the infection is completely resolved which is excellent news. No fevers, chills, nausea, or vomiting noted at this time. 07/28/18 on evaluation today patient appears to be doing very well in regard to her left lower [employer]. Everything seems to be showing signs of improvement which is excellent news. Overall very pleased with the progress that has been made. Fortunately there's no evidence of active infection at this time also excellent news. 08/04/18 on evaluation  today patient appears to be doing more poorly in regard to her bilateral lower extremities. She has two new areas open up on the right and these were completely closed as of last week. She still has the two spots on the left which in my pinion seem to be doing better. Fortunately there's no evidence of infection again at this point. 08/11/18 on evaluation today patient actually appears to be doing very well in regard to her bilateral lower Trinity wounds that all seem to be doing better and are measures smaller today. Fortunately there's no signs of infection. No fevers, chills, nausea, or vomiting noted at this time. 08/18/18 on evaluation today patient actually appears to be doing about the same inverter bilateral lower extremities. She continues to have areas that blistering open as was drain that fortunately nothing too significant. Overall I feel like Drawtex may have done better for her however compared to the collagen. 08/25/18 on evaluation today patient appears to be doing a little bit more poorly today even compared to last time I saw her. Again I'm not exactly sure why she's making worse progress over  the past several weeks. I'm beginning to wonder if there is some kind of underlying low level infection causing this issue. I did actually take a culture from the left anterior lower extremity but it was a new wound draining quite a bit at this point. Unfortunately she also seems to be having more pain which is what also makes me worried about the possibility of infection. This is despite never erythema noted at this point. 09/01/18 on evaluation today patient actually appears to be doing a little worse even compared to last week in regard to bilateral lower extremities. She did go to the hospital on the 16th was given a dose of IV Zosyn and then discharged with a recommendation to continue with the Bactrim that I previously prescribed for her. Nonetheless she is still having a lot of discomfort she tells me as well at this time. This is definitely unfortunate. No fevers, chills, nausea, or vomiting noted at this time. 09/08/18 on evaluation today patient's bilateral lower extremities actually appear to be shown signs of improvement which is good news. Fortunately there does not appear to be any signs of active infection I think the anabiotic is helping in this regard. Overall I'm very pleased with how she is progressing. 09/15/18 patient was actually seen in ER yesterday due to her legs as well unfortunately. She states she's been having a lot of pain and discomfort as well as a lot of drainage. Upon inspection today the patient does have a lot of swelling and drainage I feel like this is more related to lymphedema and poor fluid control than it is to infection based on what I'm seeing. The physician in the emergency department also doubted that the patient was having a significant infection nonetheless I see no evidence of infection obvious at this point although I do see evidence of poor fluid control. She still not using a compression pumps, she is not elevating due to her lift chair as well as her  hospital bed being broken, and she really is not keeping her legs up as much as they should be and also has been taking off her wraps. All this combined I think has led to poor fluid control and to be honest she may be somewhat volume overloaded in general as well. I recommend that she may need to contact your physician to see if a  prescription for a diuretic would be beneficial in their opinion. As long as this is safe I think it would likely help her. 09/29/18 on evaluation today patient's left lower extremity actually appears to be doing quite a bit better. At least compared to last time that I saw her. She still has a large area where she is draining from but there's a lot of new skin speckled trout and in fact there's more new skin that there are open areas of weeping and drainage at this point. This is good news. With regard to the right lower extremity this is doing much better with the only open area that I really see being a dry spot on the right lateral ankle currently. Fortunately there's no signs of active infection at this time which is good news. No fevers, chills, nausea, or vomiting noted at this time. The patient seems somewhat stressed and overwhelmed during the visit today she was very lethargic as such. She does and she is not taking any pain medications at this point. Apparently according to her husband are also in the process of moving which is probably taking its toll on her as well. 10/06/18 on evaluation today patient appears to be doing rather well in regard to her lower extremities compared to last evaluation. Fortunately there's no signs of active infection. She tells me she did have an appointment with her primary. Nonetheless he was concerned that the wounds were somewhat deep based on pictures but we never actually saw her legs. She states that he had her somewhat worried due to the fact that she was fearing now that she was [place] have to have an amputation. With that being  said based on what I'm seeing check she looks better this week that she has the last two times I've seen her with much less drainage I'm actually pleased in this regard. That doesn't mean that she's out of the water but again I do not think what the point of talking about education at all in regard to her leg. She is very happy to hear this. She is also not having as much pain as she was having last week. 10/13/18 unfortunately on evaluation today patient still continues to have a significant amount of drainage she's not letting home health actually apply the compression dressings at this point. She's trying to use of Juxta-Lite of the top of Kerlex and the second layer of the three layer compression wrap. With that being said she just does not seem to be making as good a progress as I would expect if she was having the compression applied and in place on a regular basis. No fevers, chills, nausea, or vomiting noted at this time. 10/20/18 on evaluation today patient appears to be doing a little better in regard to her bilateral lower extremity ulcers. In fact the right lower extremity seems to be healed she doesn't even have any openings at this point left lower extremity though still somewhat macerated seems to be showing signs of new skin growth at multiple locations throughout. Fortunately there's no evidence of active infection at this time. No fevers, chills, nausea, or vomiting noted at this time. 10/27/18 on evaluation today patient appears to be doing much better in regard to her left lower Trinity ulcer. She's been tolerating the laptop complication and has minimal drainage noted at this point. Fortunately there's no signs of active infection at this time. No fevers, chills, nausea, or vomiting noted at this time. 11/03/18 on evaluation today  patient actually appears to be doing excellent in regard to her left lower extremity. She is having very little drainage at this point there does not appear  to be any significant signs of infection overall very pleased with how things have gone. She is likewise extremely pleased still and seems to be making wonderful progress week to week. I do believe antibiotics were helpful for her. Her primary care provider did place on amateur clean since I last saw her. 11/17/18 on evaluation today patient appears to be doing worse in regard to her bilateral lower extremities at this point. She is been tolerating the dressing changes without complication. With that being said she typically takes the Coban off fairly quickly upon arriving home even after being seen here in the clinic and does not allow home health reapply command as part of the dressing at home. Therefore she said no compression essentially since I last saw her as best I can tell. With that being said I think it shows and how much swelling she has in the open wounds that are noted at this point. Fortunately there's no signs of infection but unfortunately if she doesn't get this under control I think she will end up with infection and more significant issues. 11/24/18 on evaluation today patient actually appears to be doing somewhat better in regard to her bilateral lower extremities. 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 Nonetheless that is doing better currently so she should be able to attempt reinitiating the lymphedema pumps at this time. No fevers, chills, nausea, or vomiting noted at this time. 12/01/18 upon evaluation today patient's left lower extremity appears to be doing quite well unfortunately her right lower extremity is not doing nearly as well. She has been tolerating the dressing changes without complication unfortunately she did not keep a wrap on the right at this time. Nonetheless I believe this has led to increased swelling and weeping in  the world is actually much larger than during the last evaluation with her. 12/08/18 on evaluation today patient appears to be doing about the same at this point in regard to her right lower extremity. There is some more palatable to touch I'm concerned about the possibility of there being some infection although I think the main issue is she's not keeping her compression wrap on which in turn is not allowing this area to heal appropriately. 12/22/18 on evaluation today patient appears to be doing better in regard to left lower extremity unfortunately significantly worse in regard to the right lower extremity. The areas of blistering and necrotic superficial tissue have spread and again this does not really appear to be signs of infection and all she just doesn't seem to be doing nearly as well is what she has been in the past. Overall I feel like the Augmentin did absolutely nothing for her she doesn't seem to have any infection again I really didn't think so last time either is more of a potential preventative measure and hoping that this would make some difference but I think the main issue is she's not wearing her compression. She tells me she cannot wear the [place] been we put on she takes it off pretty much upon getting home. Subsequently she worshiped Juxta-Lite when I questioned her about how often she wears it this is no  more than three hours a day obviously that leaves 21 hours that she has no compression and this is obviously not doing well for her. Overall I'm concerned that if things continue to worsen she is at great risk of both infection as well as losing her leg. 01/05/19 on evaluation today patient appears to be doing well in regard to her left lower extremity which he is allowing [place] to wrap and not so well with regard to her right lower extremity which she is not allowing [place] to really wrap and keep the wrap on. She states that it hurts too badly whenever it's wrapped and she ends up  having to take it off. She's been using the Juxta-Lite she tells me up to six hours a day although I question whether or not that's really been the case to be honest. Previously she told me three hours today nonetheless obviously the legs as long as the wrap is doing great when she is not is doing much more poorly. 01/12/2019 on evaluation today patient actually appears to be doing a little better in my opinion with regard to her right lower extremity ulcer. She has a small open area on the left lower extremity unfortunately but again this I think is part of the normal fluctuation of what she is going to have to expect with regard to her legs especially when she is not using her lymphedema pumps on a regular basis. Subsequently based on what I am seeing today I think that she does seem to be doing slightly better with regard to her right lower extremity she did see her primary care provider on Monday they felt she had an infection and placed her on 2 antibiotics. Both Cipro and clindamycin. Subsequently again she seems possibly to be doing a little bit better in regards to the right lower extremity she also tells me however she has been wearing the compression wrap over the past week since I spoke with her as well that is a Kerlix and Coban wrap on the right. No fevers, chills, nausea, vomiting, or diarrhea. 01/19/2019 on evaluation today patient appears to be doing better with regard to her bilateral lower extremities especially the right. I feel like the compression has been beneficial for her which is great news. She did get a call from her primary care provider on her way here today telling her that she did have methicillin- resistant Staphylococcus aureus and he was calling in a couple new antibiotics for her including a ointment to be applied she tells me 3 times a day. With that being said this sounds like likely to be Bactroban which I think could be applied with each dressing/wrap change but I  would not be able to accommodate her applying this 3 times a day. She is in agreement with the least doing this we will add that to her orders today. 01/26/2019 on evaluation today patient actually appears to be doing much better with regard to her right lower extremity. Her left lower extremity is also doing quite well all things considering. Fortunately there is no evidence of active infection at this time. No fevers, chills, nausea, vomiting, or diarrhea. 02/02/2019 on evaluation today patient appears to be doing much better compared to her last evaluation. Little by little off like her right leg is returning more towards normal. There does not appear to be any signs of active infection and overall she seems to be doing quite well which is great news. I am very pleased  in this regard. No fevers, chills, nausea, vomiting, or diarrhea. 02/09/2019 upon evaluation today patient appears to be doing better with regard to her bilateral lower extremities. She has been tolerating the dressing changes without complication. Fortunately there is no signs of active infection at this time. No fevers, chills, nausea, vomiting, or diarrhea. 02/23/2019 on evaluation today patient actually appears to be doing quite well with regard to her bilateral lower extremities. She has been tolerating the dressing changes without complication. She is even used her pumps one time and states that she really felt like it felt good. With that being said she seems to be in good spirits and her legs appear to be doing excellent. 03/09/2019 on evaluation today patient appears to be doing well with regard to her right lower extremity there are no open wounds at this time she is having some discomfort but I feel like this is more neuropathy than anything. With regard to her left lower extremity she had several areas scattered around that she does have some weeping and drainage from but again overall she does not appear to be having any  significant issues and no evidence of infection at this time which is good news. 03/23/2019 on evaluation today patient appears to be doing well with regard to her right lower extremity which she tells me is still close she is using her juxta light here. Her left lower extremity she mainly just has an area on the foot which is still slightly draining although this also is doing great. Overall very pleased at this time. 04/06/2019 patient appears to be doing a little bit worse in regard to her left lower extremity upon evaluation today. She feels like this could be becoming infected again which she had issues with previous. Fortunately there is no signs of systemic infection but again this is always a struggle with her with her legs she will go from doing well to not so well in a very short amount of time. 04/20/2019 on evaluation today patient actually appears to be doing quite well with regard to her right lower extremity I do not see any signs of active infection at this time. Fortunately there is no fever chills noted. She is still taking the antibiotics which I prescribed for her at this point. In regard to the left lower extremity I do feel like some of these areas are better although again she still is having weeping from several locations at this time. 04/27/2019 on evaluation today patient appears to be doing about the same if not slightly worse in regard to her left lower extremity ulcers. She tells me when questioned that she has been sleeping in her Hoveround chair in fact she tells me she falls asleep without even knowing it. I think she is spending a whole lot of time in the chair and less time walking and moving around which is not good for her legs either. On top of that she is in a seated position which is also the worst position she is not really elevating her legs and she is also not using her lymphedema pumps. All this is good to contribute to worsening of her condition in  general. 05/18/2019 on evaluation today patient appears to be doing well with regard to her lower extremity on the right in fact this is showing no signs of any open wounds at this time. On the left she is continuing to have issues with areas that do drain. Some of the regions have healed and  there are couple areas that have reopened. She did go to the ER per the patient according to recommendations from the home health nurse due to what she was seen when she came out on 05/13/2019. Subsequently she felt like the patient needed to go to the hospital due to the fact that again she was having "milky white discharge" from her leg. Nonetheless she had and then was placed on doxycycline and subsequently seems to be doing better. 06/01/2019 upon evaluation today patient appears to be doing really in my opinion about the same. I do not see any signs of active infection which is good news. Overall she still has wounds over the bilateral lower extremities she has reopened on the right but this appears to be more of a crack where there is weeping/edema coming from the region. I do not see any evidence of infection at either site based on what I visualized today. 07/13/2019 upon evaluation today patient appears to be doing a little worse compared to last time I saw her. She since has been in the hospital from 06/21/2019 through 06/29/2019. This was secondary to having Covid. During that time they did apply lotion to her legs which unfortunately has caused her to develop a myriad of open wounds on her lower extremities. Her legs do appear to be doing better as far as the overall appearance is concerned but nonetheless she does have more open and weeping areas. 07/27/2019 upon evaluation today patient appears to be doing more poorly to be honest in regard to her left lower extremity in particular. There is no signs of systemic infection although I do believe she may have local infection. She notes she has been having  a lot of blue/green drainage which is consistent potentially with Pseudomonas. That may be something that we need to consider here as well. The doxycycline does not seem to have been helping. 08/03/2019 upon evaluation today patient appears to be doing a little better in my opinion compared to last week's evaluation. Her culture I did review today and she is on appropriate medications to help treat the Enterobacter that was noted. Overall I feel like that is good news. With that being said she is unfortunately continuing to have a lot of drainage and though it is doing better I still think she has a long ways to go to get things dried up in general. Fortunately there is no signs of systemic infection. 08/10/2019 upon evaluation today patient appears to be doing may be slightly better in regard to her left lower extremity the right lower extremity is doing much better. Fortunately there is no signs of infection right now which is good news. No fevers, chills, nausea, vomiting, or diarrhea. 08/24/2019 on evaluation today patient appears to be doing slightly better in regard to her lower extremities. The left lower extremity seems to be healed the right lower extremity is doing better though not completely healed as far as the openings are concerned. She has some generalized issues here with edema and weeping secondary to her lymphedema though again I do believe this is little bit drier compared to prior weeks evaluations. In general I am very pleased with how things seem to be progressing. No fevers, chills, nausea, vomiting, or diarrhea. 08/31/2019 upon evaluation today patient actually seems to making some progress here with regard to the left lower extremity in particular. She has been tolerating the dressing changes without complication. Fortunately there is no signs of active infection at this time. No fevers,  chills, nausea, vomiting, or diarrhea. She did see Dr. Doren Custard and he did note that she did have  a issue with the left great saphenous vein and the small saphenous vein in the leg. With that being said he was concerned about the possibility of laser ablation not being extremely successful. He also mentioned a small risk of DVT associated with the procedure. However if the wounds do not continue to improve he stated that that would probably be the way to go. Fortunately the patient's legs do seem to be doing much better. 09/07/2019 upon evaluation today patient appears to be doing better with regard to her lower extremities. She has been tolerating the dressing changes without complication. With that being said she is showing signs of improvement and overall very pleased. There are some areas on her leg that I think we do need to debride we discussed this last week the patient is in agreement with doing that as long as it does not hurt too badly. 09/14/2019 upon evaluation today patient appears to be doing decently well with regard to her left lower extremity. She is not having near as much weeping as she has had in the past things seem to be drying up which is good news. There is no signs of active infection at this time. 09/21/19 upon evaluation today patient appears to be doing better in regard overall to her bilateral lower extremities. She again has less open than she did previous and each week I feel like this is getting better. Fortunately there is no signs of active infection at this time. No fevers, chills, nausea, vomiting, or diarrhea. 09/28/2019 upon evaluation today patient actually appears to be showing signs of improvement with regard to her left lower extremity. Unfortunately the right medial lower extremity around the ankle region has reopened to some degree but this appears to be minimal still which is good news. There is no signs of active infection at this time which is also good news. 10/12/2019 upon evaluation today patient appears to be doing okay with regard to her bilateral  lower extremities today. The right is a little bit worse then last evaluation 2 weeks ago. The left is actually doing a little better in my opinion. Overall there is no signs of active infection at this time that I see. Obviously that something we have to keep a close eye on she is very prone to this with the significant and multiple openings that she has over the bilateral lower extremities. 10/19/2019 upon evaluation today patient appears to be doing about the best that I have seen her in quite some time. She has been tolerating the dressing changes without complication. There does not appear to be any signs of active infection and overall I am extremely happy with the way her legs appeared. She is drying up quite nicely and overall is having less pain. 11/09/2019 upon evaluation today patient appears to be doing better in regard to her wounds. She seems to be drying up more and more each time I see her this is just taking a very long time. Fortunately there is no signs of active infection at this time. 11/23/2019 upon evaluation today patient actually appears to be doing excellent in regard to her lower extremities at this point compared to where she has been. Fortunately there is no signs of active infection at this time. She did go to the hospital last week for nausea and vomiting completely unrelated to her wounds. Fortunately she is doing better  she was given some Reglan and got better. She had associated abdominal pain but they never found out what was going on. 12/07/2019 upon evaluation today patient appears to be doing well for the most part in regard to her legs. She unfortunately has not been keeping the Coban portion of her wraps on therefore the compression has not really been sufficient for what it is supposed to be. Nonetheless she tells me that it just hurt too bad therefore she removed it. 12/21/2019 upon evaluation today patient actually appears to be doing quite well with regard to her  legs. I do feel like she has been making progress which is great news and overall there is no signs of active infection at this time. No fevers, chills, nausea, vomiting, or diarrhea. 01/04/2020 upon evaluation today patient presents for follow-up concerning her lower extremity edema bilaterally. She still has open wounds she has not been using her lymphedema pumps. She is also not been utilizing her compression wraps appropriately she tends to unwrap them, take them off, or states that they hurt. Obviously the reason they hurt is because her legs start to swell but the issue is if she would use her compression/lymphedema pumps regularly she would not swell and she would have the pain. Nonetheless she has not even picked them up once honestly over the past several months and may be even as much as in the past year based on my opinion and what have seen. She tells me today that after last week when I talked about this with her specifically actually that was 2 weeks ago that she "forgot". 8//21 on evaluation today patient appears to be doing a little better in regard to her legs bilaterally. Fortunately there is no signs of active infection at this time. She tells me that she used her lymphedema pumps all of one time over the past 2 weeks since I last saw her. She tells me that she has been too busy in order to continue to use these. 02/01/2020 on evaluation today patient appears to be doing some better in regard to her wounds in general in her legs. We felt the right was healed although is not completely it does appear to be doing better she tells me she has been using her lymphedema pumps that she has had this six times since I last saw her. Obviously the more she does that the better she would do my opinion Electronic Signature(s) Signed: 02/01/2020 11:52:12 AM By: [name] PA-C Entered By: [name] on 02/01/2020  11:52:12 -------------------------------------------------------------------------------- Physical Exam Details Patient Name: Date of Service: CONDA, WANNAMAKER 02/01/2020 10:45 A M Medical Record Number: [id number] Patient Account Number: [id number] Date of Birth/Sex: Treating RN: 1950/12/10 (69 y.o. [name] Primary Care Provider: [name] Other Clinician: Referring Provider: Treating Provider/Extender: Doyle Askew, FRED Weeks in Treatment: 130 Constitutional Well-nourished and well-hydrated in no acute distress. Respiratory normal breathing without difficulty. Psychiatric this patient is able to make decisions and demonstrates good insight into disease process. Alert and Oriented x 3. pleasant and cooperative. Notes Patient's wound bed currently showed signs of good granulation at this time there does not appear to be any evidence of active infection at this point which is also good news. Overall very pleased with where things stand today. Electronic Signature(s) Signed: 02/01/2020 11:52:47 AM By: [name] PA-C Entered By: [name] on 02/01/2020 11:52:47 -------------------------------------------------------------------------------- Physician Orders Details Patient Name: Date of Service: [name]. 02/01/2020  10:45 A M Medical Record Number: [id number] Patient Account Number: [id number] Date of Birth/Sex: Treating RN: [birth year] (69 y.o. [name] Primary Care Provider: [name] Other Clinician: Referring Provider: Treating Provider/Extender: Doyle Askew, FRED Weeks in Treatment: [phone] Verbal / Phone Orders: No Diagnosis Coding ICD-10 Coding Code Description E11.622 Type 2 diabetes mellitus with other skin ulcer I89.0 Lymphedema, not elsewhere classified I87.331 Chronic venous hypertension (idiopathic) with ulcer and inflammation of right lower extremity I87.332 Chronic venous hypertension (idiopathic) with  ulcer and inflammation of left lower extremity L97.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 leg with fat layer exposed L97.822 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 Dressing Change Frequency Wound #61 Left,Circumferential Lower Leg Change dressing three times week. Wound #64 Left,Dorsal Foot Change dressing three times week. Wound #65 Right,Medial Lower Leg Change dressing three times week. Skin Barriers/Peri-Wound Care Barrier cream - zinc oxide cream to any macerated areas Moisturizing lotion - to dry skin Wound Cleansing Clean wound with Wound Cleanser - all wounds, wash legs with soap and water with dressing changes May shower with protection. Primary Wound Dressing Wound #61 Left,Circumferential Lower Leg Calcium Alginate with Silver Wound #64 Left,Dorsal Foot Calcium Alginate with Silver Wound #65 Right,Medial Lower Leg Calcium Alginate with Silver Secondary Dressing Wound #61 Left,Circumferential Lower Leg Dry Gauze ABD pad - as needed Wound #64 Left,Dorsal Foot Dry Gauze ABD pad - as needed Wound #65 Right,Medial Lower Leg Dry Gauze Edema Control 3 Layer Compression System - Bilateral void standing for long periods of time - walking is encouraged A Elevate legs to the level of the heart or above for 30 minutes daily and/or when sitting, a frequency of: - do not sleep in chair with feet dangling, MUST elevate legs while sitting Exercise regularly Segmental Compressive Device. - lymphedema pumps 60 minutes 1- 2 times per day Off-Loading Turn and reposition every 2 hours Additional Orders / Instructions Follow Nutritious  Diet - T include vitamin A, vitamin C, and Zinc along with increased protein intake. o [place] skilled [facility] for wound care. - [facility] Electronic Signature(s) Signed: 02/01/2020 5:00:27 PM By: [name] PA-C Signed: 02/01/2020 5:37:29 PM By: [name] RN, BSN Entered By: [name] on 02/01/2020 11:51:08 -------------------------------------------------------------------------------- Problem List Details Patient Name: Date of Service: [name]. 02/01/2020 10:45 A M Medical Record Number: [id number] Patient Account Number: [id number] Date of Birth/Sex: Treating RN: 1950-09-09 (69 y.o. [name] Primary Care Provider: [name] Other Clinician: Referring Provider: Treating Provider/Extender: Doyle Askew, FRED Weeks in Treatment: [phone] Active Problems ICD-10 Encounter Code Description Active Date MDM Diagnosis E11.622 Type 2 diabetes mellitus with other skin ulcer 08/05/2017 No Yes I89.0 Lymphedema, not elsewhere classified 08/05/2017 No Yes I87.331 Chronic venous hypertension (idiopathic) with ulcer and inflammation of right 08/05/2017 No Yes lower extremity I87.332 Chronic venous hypertension (idiopathic) with ulcer and inflammation of left 08/05/2017 No Yes lower extremity L97.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 leg with fat layer 08/05/2017 No Yes exposed L97.822 Non-pressure chronic ulcer of other part of left lower leg with fat layer exposed2/20/2019 No Yes L97.522 Non-pressure chronic ulcer of other part of left foot with  fat layer exposed 06/01/2019 No Yes I10 Essential (primary) hypertension 08/05/2017 No Yes E66.01 Morbid (severe) obesity due to excess calories 08/05/2017 No Yes F41.8 Other specified anxiety disorders 08/05/2017 No Yes R53.1 Weakness 08/05/2017 No Yes Inactive Problems Resolved Problems Electronic Signature(s) Signed: 02/01/2020 10:51:13 AM By: [name]  PA-C Entered By: [name] on 02/01/2020 10:51:12 -------------------------------------------------------------------------------- Progress Note Details Patient Name: Date of Service: [name]. 02/01/2020 10:45 A M Medical Record Number: [id number] Patient Account Number: [id number] Date of Birth/Sex: Treating RN: 1950-07-14 (70 y.o. [name] Primary Care Provider: [name] Other Clinician: Referring Provider: Treating Provider/Extender: Doyle Askew, FRED Weeks in Treatment: [phone] Subjective Chief Complaint Information obtained from Patient Bilateral reoccurring LE ulcers History of Present Illness (HPI) this patient has been seen a couple of times before and returns with recurrent problems to her right and left lower extremity with swelling and weeping ulcerations due to not wearing her compression stockings which she had been advised to do during her last discharge, at the end of June 2018. During her last visit the patient had had normal arterial blood flow and her venous reflux study did not necessitate any surgical intervention. She was recommended compression and elevation and wound care. After prolonged treatment the patient was completely healed but she has been noncompliant with wearing or compressions.. She was here last week with an outpatient return visit planned but the patient came in a very poor general condition with altered mental status and was rushed to the ER on my request. With a history of hypertension, diabetes, TIA and right-sided weakness she was set up for an MRI on her brain and cervical spine and was sent to [facility]. Getting an MRI done was very difficult but once the workup was done she was found not to have any spinal stenosis, epidural abscess or hematoma or discitis. This was radiculopathy to be treated as an outpatient and she was given a follow-up appointment. Today she is feeling much better alert and oriented and has  come to reevaluate her bilateral lower extremity lymphedema and ulceration 03/25/2017 -- she was admitted to the hospital on 03/16/2017 and discharged on 03/18/2017 with left leg cellulitis and ulceration. She was started on vancomycin and Zosyn and x-ray showed no bony involvement. She was treated for a cellulitis with IV antibiotics changed to Rocephin and Flagyl and was discharged on oral Keflex and doxycycline to complete a 7 day course. Last hemoglobin A1c was 7.1 and her other ailments including hypertension got asthma were appropriately treated. 05/06/2017 -- she is awaiting the right size of compression stockings from [place] but other than that has been doing well. ====== Old notes 69 year old patient was seen one time last October and was lost to follow-up. She has recurrent problems with weeping and ulceration of her left lower extremity and has swelling of this for several years. It has been worse for the last 2 months. Past medical history is significant for diabetes mellitus type 2, hypertension, gout, morbid obesity, depressive disorders, hiatal hernia, migraines, status post knee surgery, risk of a cholecystectomy, vaginal hysterectomy and breast biopsy. She is not a smoker. As noted before she has never had a venous duplex study and an arterial ABI study was attempted but the left lower extremity was noncompressible 10/01/2016 -- had a lower extremity venous duplex reflux evaluation which showed no evidence of deep vein reflux in the right or left lower extremity, and no evidence of great saphenous vein reflux  more than 500 ms in the right or left lower extremity, and the left small saphenous vein is incompetent but no vascular consult was recommended. review of her electronic medical records noted that the ABI was checked in July 2017 where the right ABI was normal limits and the left ABI could not be ascertained due to pain with cuff pressure but the waveforms are within normal  limits. her arterial duplex study scheduled for April 27. 10/08/2016 -- the patient has various reasons for not having a compression on and for the last 3 days she has had no compression on her left lower extremity either due to pain or the lack of nursing help. She does not use her juxta lites either. 10/15/2016 -- the patient did not keep her appointment for arterial duplex study on April 27 and I have asked her to reschedule this. Her pain is out of proportion with the physical findings and she continuously fails to wear a compression wraps and cuts them off because she says she cannot tolerate the pain. She does not use her juxta lites either. 10/22/2016 -- he has rescheduled her arterial duplex study to May 21 and her pain today is a bit better. She has not been wearing her juxta lites on her right lower extremity but now understands that she needs to do this. She did tolerate the to press compression wrap on her left lower extremity 10/29/2016 --arterial duplex study is scheduled for next week and overall she has been tolerating her compression wraps and also using her juxta lites on her right lower extremity 11/05/2016 -- the right ABI was 0.95 the left was 1.03. The digit TBI is on the right was 0.83 on the left was 0.92 and she had biphasic flow through these vessels. The impression was that of normal lower extremity arterial study. 11/12/2016 -- her pain is minimal and she is doing very well overall. 11/26/2016 -- she has got juxta lites and her insurance will not pay for additional dual layer compression stockings. She is going to order some from [place]. 05/12/2017 -- her juxta lites are very old and too big for her and these have not been helping with compression. She did get 20-30 mm compression stockings from [place] but she and her husband are unable to put these on. I believe she will benefit from bilateral Extremit-ease, compression stockings and we will measure her for these  today. 05/20/2017 -- lymphedema on the left lower extremity has increased a lot and she has a open ulceration as a result of this. The right lower extremity is looking pretty good. She has decided to by the compression stockings herself and will get reimbursed by the home health, at a later date. 05/27/2017 -- her sciatica is bothering her a lot and she thought her left leg pain was caused due to the compression wrap and hence removed it and has significant lymphedema. There is no inflammation on this left lower extremity. 06/17/17 on evaluation today patient appears to be doing very well and in fact is completely healed in regard to her ulcerations. Unfortunately however she does have continued issues with lymphedema nonetheless. We did order compression garments for her unfortunately she states that the size that she received were large although we ordered medium. Obviously this means she is not getting the optimal compression. She does not have those with her today and therefore we could not confirm and contact the company on her behalf. Nonetheless she does state that she is going to  have her husband bring them by tomorrow so that we can verify and then get in touch with the company. No fevers, chills, nausea, or vomiting noted at this time. Overall patient is doing better otherwise and I'm pleased with the progress she has made. 07/01/17 on evaluation today patient appears to be doing very well in regard to her bilateral lower extremity she does not have any openings at this point which is excellent news. Overall I'm pleased with how things have progressed up to this time. Since she is doing so well we did order her compression which we are seeing her today to ensure that it fits her properly and everything is doing well in that regard and then subsequently she will be discharged. ============ Old Notes: 03/31/16 patient presents today for evaluation concerning open wounds that she has over the  left medial ankle region as well as the left dorsal foot. She has previously had this occur although it has been healed for a number of months after having this for about a year prior until her hospitalization on 01/05/16. At that point in time it appears that she was admitted to the hospital for left lower extremity cellulitis and was placed on vancomycin and Zosyn at that point. Eventually upon discharge on January 15, 2016 she was placed on doxycycline at that point in time. Later on 03/27/16 positive wound culture growing Escherichia coli this was switched to amoxicillin. Currently she tells me that she is having pain radiated to be a 7 out of 10 which can be as high as 10 out of 10 with palpation and manipulation of the wound. This wound appears to be mainly venous in nature due to the bilateral lower extremity venous stasis/lymphedema. This is definitely much worse on her left than the right side. She does have type 1 diabetes mellitus, hypertension, morbid obesity, and is wheelchair dependent.during the course of the hospital stay a blood culture was also obtained and fortunately appeared negative. She also had an x-ray of the tibia/fibula on the left which showed no acute bone abnormality. Her white blood cell count which was performed last on 03/25/16 was 7.3, hemoglobin 12.8, protein 7.1, albumin 3.0. Her urine culture appeared to be negative for any specific organisms. Patient did have a left lower extremity venous duplex evaluation for DVT . This did not include venous reflux studies but fortunately was negative for DVT Patient also had arterial studies performed which revealed that she had a . normal ABI on the right though this was unable to be performed on the left secondary to pain that she was having around the ankle region due to the wound. However it was stated on report that she had biphasic pulses and apparently good blood flow. ========== 06/03/17 she is here in follow-up evaluation  for right lower extremity ulcer. The right lower sure he has healed but she has reopened to the left medial malleolus and dorsal foot with weeping. She is waiting for new compression garments to arrive from home health, the previous compression garments were ill fitting. We will continue with compression bilaterally and follow-up in 2 weeks Readmission: 08/05/17 on evaluation today patient appears to be doing somewhat poorly in regard to her left lower extremity especially although the right lower extremity has a small area which may no longer be open. She has been having a lot of drainage from the left lower extremity however he tells me that she has not been able to use the EXTREMIT-EASE Compression at this  point. She states that she did better and was able to actually apply the Juxta-Lite compression although the wound that she has is too large and therefore really does not compress which is why she cannot wear it at this point. She has no one who can help her put it on regular basis her son can sometimes but he's not able to do it most of the time. I do believe that's why she has begun to weave and have issues as she is currently yet again. No fevers, chills, nausea, or vomiting noted at this time. Patient is no evidence of dementia. 08/12/17 on evaluation today patient appears to still be doing fairly well in regard to the draining areas/weeping areas at this point. With that being said she unfortunately did go to the ER yesterday due to what was felt to be possibly a cellulitis. They place her on doxycycline by mouth and discharge her home. She definitely was not admitted. With that being said she states she has had more discomfort which has been unusual for her even compared to prior times and she's had infections.08/12/17 on evaluation today patient appears to still be doing fairly well in regard to the draining areas/weeping areas at this point. With that being said she unfortunately did go to  the ER yesterday due to what was felt to be possibly a cellulitis. They place her on doxycycline by mouth and discharge her home. She definitely was not admitted. With that being said she states she has had more discomfort which has been unusual for her even compared to prior times and she's had infections. 08/19/17 put evaluation today patient tells me that she's been having a lot of what sounds to be neuropathic type pain in regard to her left lower extremity. She has been using over-the-counter topical bins again which some believe. That in order to apply the she actually remove the wrap we put on her last Wednesday on Thursday. Subsequently she has not had anything on compression wise since that time. The good news is a lot of the weeping areas appear to have closed at this point again I believe she would do better with compression but we are struggling to get her to actually use what she needs to at this point. No fevers, chills, nausea, or vomiting noted at this time. 09/03/17 on evaluation today patient appears to be doing okay in regard to her lower extremities in regard to the lymphedema and weeping. Fortunately she does not seem to show any signs of infection at this point she does have a little bit of weeping occurring in the right medial malleolus area. With that being said this does not appear to be too significant which is good news. 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 She has severe skin damage secondary to both of these features involving the dorsal left foot and medial left ankle and lower leg. Still has open areas in the left anterior foot. The area on the right closed over. She uses her own juxta light stockings. She does not have an arterial issue 09/16/17 on evaluation today patient actually appears to be doing excellent in regard to her bilateral lower extremity swelling. The Juxta-Lite compression wrap seem  to be doing very well for her. She has not however been using the portion that goes over her foot. Her left foot still is draining a little bit not nearly as significant as it has been in the past  but still I do believe that she likely needs to utilize the full wrap including the foot portion of this will improve as well. She also has been apparently putting on a significant amount of Vaseline which also think is not helpful for her. I recommended that if she feels she needs something for moisturizer Eucerin will probably be better. 09/30/17 on evaluation today patient presents with several new open areas in regard to her left lower extremity although these appear to be minimal and mainly seem to be more moisture breakdown than anything. Fortunately she does not seem to have any evidence of infection which is great news. She has been tolerating the dressing changes without complication we are using silver alginate on the foot she has been using AB pads to have the legs and using her Juxta- Lite compression which seems to be controlling her swelling very well. Overall I'm pleased with the poor way she has progressed. 10/14/17 on evaluation today patient appears to be doing better in regard to her left lower extremity areas of weeping. She does still have some discomfort although in general this does not appear to be as macerated and I think it is progressing nicely. I do think she still needs to wear the foot portion of her Juxta- Lite in order to get the most benefit from the wrap obviously. She states she understands. Fortunately there does not appear to be evidence of infection at this time which is great news. 10/28/17 on evaluation today patient appears to be doing excellent in regard to her left lower extremity. She has just a couple areas that are still open and seem to be causing any trouble whatsoever. For that reason I think that she is definitely headed in the right direction the spots are very  tiny compared to what we have been dealing with in the past. 11/11/17 on evaluation today patient appears to have a right lateral lower extremity ulcer that has opened since I last saw her. She states this is where the home health nurse that was coming out remove the dressing without wetting the alginate first. Nonetheless I do not know if this is indeed the case or not but more importantly we have not ordered home help to be coming out for her wounds at all. I'm unsure as to why they are coming out and we're gonna have to check on this and get things situated in that regard. With that being said we currently really do not need them to be coming out as the patient has been taking care of her leg herself without complication and no issues. In fact she was doing much better prior to nursing coming out. 11/25/17 on evaluation today patient actually appears to be doing fairly well in regard to her left lower extremity swelling. In fact she has very little area of weeping at this point there's just a small spot on the lateral portion of her right leg that still has me just a little bit more concerned as far as wanting to see this clear up before I discharge her to caring for this at home. Nonetheless overall she has made excellent progress. 12/09/17 on evaluation today patient appears to be doing rather well in regard to her lower extremity edema. She does have some weeping still in the left lower extremity although the big area we were taking care of two weeks ago actually has closed and she has another area of weeping on the left lower extremity immediately as well is  the top of her foot. She does not currently have lymphedema pumps she has been wearing her compression daily on a regular basis as directed. With that being said I think she may benefit from lymphedema pumps. 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 We had a very difficult time getting and keeping this under control. 12/23/17 on evaluation today patient actually appears to be doing a little bit more poorly in regard to her bilateral lower extremities. She has been tolerating the Juxta-Lite compression wraps. Unfortunately she has two new ulcers on the right lower extremity and left lower Trinity ulceration seems to be larger. Obviously this is not good news. She has been tolerating the dressings without complication. 12/30/17 on evaluation today patient actually appears to be doing much better in regard to her bilateral lower extremity edema. She continues to have some issues with ulcerations and in fact there appears to be one spot on each leg where the wrap may have caused a little bit of a blister which is subsequently opened up at this point is given her pain. Fortunately it does not appear to be any evidence of infection which is good news. No fevers chills noted. 01/13/18 on evaluation today patient appears to be doing rather well in regard to her bilateral lower extremities. The dressings did get kind of stuck as far as the wound beds are concerned but again I think this is mainly due to the fact that she actually seems to be showing signs of healing which is good news. She's not having as much drainage therefore she was having more of the dressing sticking. Nonetheless overall I feel like her swelling is dramatically down compared to previous. 01/20/18 on evaluation today patient unfortunately though she's doing better in most regards has a large blister on the left anterior lower extremity where she is draining quite significantly. Subsequently this is going to need debridement today in order to see what's underneath and ensure she does not continue to trapping fluid at this location. Nonetheless No fevers, chills, nausea, or vomiting noted at this time. 01/27/18 on evaluation today  patient appears to be doing rather well at this point in regard to her right lower extremity there's just a very small area that she still has open at this point. With that being said I do believe that she is tolerating the compression wraps very well in making good progress. Home health is coming out at this point to see her. Her left lower extremity on the lateral portion is actually what still mainly open and causing her some discomfort for the most part 02/10/18 on evaluation today patient actually appears to be doing very well in regard to her right lower extremity were all the ulcers appear to be completely close. In regard to the left lower extremity she does have two areas still open and some leaking from the dorsal surface of her foot but this still seems to be doing much better to me in general. 02/24/18 on evaluation today patient actually appears to be doing much better in regard to her right lower extremity this is still completely healed. Her left lower extremity is also doing much better fortunately she has no evidence of infection. The one area that is gonna require some debridement is still on the left anterior shin. Fortunately this is not hurting her  as badly today. 03/10/18 on evaluation today patient appears to be doing better in some regards although she has a little bit more open area on the dorsal foot and she also has some issues on the medial portion of the left lower extremity which is actually new and somewhat deep. With that being said there fortunately does not appear to be any significant signs of infection which is good news. No fevers, chills, nausea, or vomiting noted at this time. In general her swelling seems to be doing fairly well which is good news. 03/31/18 on evaluation today patient presents for follow-up concerning her left lower extremity lymphedema. Unfortunately she has been doing a little bit more poorly since I last saw her in regard to the amount of weeping  that she is experiencing. She's also having some increased pain in the anterior shin location. Unfortunately I do not feel like the patient is making such good progress at this point a few weeks back she was definitely doing much better. 04/07/18 on evaluation today patient actually appears to be showing some signs of improvement as far as the left lower extremity is concerned. She has been tolerating the dressing changes and it does appear that the Drawtex did better for her. With that being said unfortunately home health is stating that they cannot obtain the Drawtex going forward. Nonetheless we're gonna have to check and see what they may be able to get the alginate they were using was getting stuck in causing new areas of skin being pulled all that with and subsequently weep and calls her to worsen overall this is the first time we've seen improvement at this time. 04/14/18 on evaluation today patient actually appears to be doing rather well at this point there does not appear to be any evidence of infection at this time and she is actually doing excellent in regard to the weeping in fact she almost has no openings remaining even compared to just last week this is a dramatic improvement. No fevers chills noted 04/21/18 evaluation today patient actually appears to be doing very well. She in fact is has a small area on the posterior lower extremity location and she has a small area on the dorsal surface of her foot that are still open both of which are very close to closing. We're hoping this will be close shortly. She brought her Juxta-Lite wrap with her today hoping that would be able to put her in it unfortunately I don't think were quite at that point yet but we're getting closer. 04/28/18 upon evaluation today patient actually appears to be doing excellent in regard to her left lower extremity ulcer. In fact the region on the posterior lower extremity actually is much smaller than previously  noted. Overall I'm very happy with the progress she has made. She again did bring her Juxta-Lite although we're not quite ready for that yet. 05/11/18 upon evaluation today patient actually appears to be doing in general fairly well in regard to her left lower Trinity. The swelling is very well controlled. With that being said she has a new area on the left anterior lower extremity as well as between the first and second toes of her left foot that was not present during the last evaluation. The region of her posterior left lower extremity actually appears to be almost completely healed. T be honest I'm very pleased with o the way that stands. Nonetheless I do believe that the lotion may be keeping the area to moist  as far as her legs are concerned subsequently I'm gonna consider discontinuing that today. 05/26/18 on evaluation today patient appears to be doing rather well in regard to her left lower should be ulcers. In fact everything appears to be close except for a very small area on the left posterior lower extremity. Fortunately there does not appear to be any evidence of infection at this time. Overall very pleased with her progress. 06/02/18 and evaluation today patient actually appears to be doing very well in regard to her lower extremity ulcers. She has one small area that still continues to weep that I think may benefit her being able to justify lotion and user Juxta-Lite wraps versus continued to wrap her. Nonetheless I think this is something we can definitely look into at this point. 06/23/18 on evaluation today patient unfortunately has openings of her bilateral lower extremities. In general she seems to be doing much worse than when I last saw her just as far as her overall health standpoint is concerned. She states that her discomfort is mainly due to neuropathy she's not having any other issues otherwise. No fevers, chills, nausea, or vomiting noted at this time. 06/30/18 on  evaluation today patient actually appears to be doing a little worse in regard to her right lower extremity her left lower extremity of doing fairly well. Fortunately there is no sign of infection at this time. She has been tolerating the dressing changes without complication. Home health did not come out like they were supposed to for the appropriate wrap changes. They stated that they never received the orders from [place] which were fax. Nonetheless we will send a copy of the orders with the patient today as well. 07/07/18 on evaluation today patient appears to be doing much better in regard to lower extremities. She still has several openings bilaterally although since I last saw her her legs did show obvious signs of infection when she later saw her nurse. Subsequently a culture was obtained and she is been placed on Bactrim and Keflex. Fortunately things seem to be looking much better it does appear she likely had an infection. Again last week we'd even discussed it but again there really was not any obvious sign that she had infection therefore we held off on the antibiotics. Nonetheless I'm glad she's doing better today. 07/14/18 on evaluation today patient appears to be doing much better regarding her bilateral lower Trinity's. In fact on the right lower for me there's nothing open at this point there are some dry skin areas at the sites where she had infection. Fortunately there is no evidence of systemic infection which is excellent news. No fevers chills noted 07/21/18 on evaluation today patient actually appears to be doing much better in regard to her left lower extremity ulcers. She is making good progress and overall I feel like she's improving each time I see her. She's having no pain I do feel like the infection is completely resolved which is excellent news. No fevers, chills, nausea, or vomiting noted at this time. 07/28/18 on evaluation today patient appears to be doing very well in regard  to her left lower [employer]. Everything seems to be showing signs of improvement which is excellent news. Overall very pleased with the progress that has been made. Fortunately there's no evidence of active infection at this time also excellent news. 08/04/18 on evaluation today patient appears to be doing more poorly in regard to her bilateral lower extremities. She has two new areas  open up on the right and these were completely closed as of last week. She still has the two spots on the left which in my pinion seem to be doing better. Fortunately there's no evidence of infection again at this point. 08/11/18 on evaluation today patient actually appears to be doing very well in regard to her bilateral lower Trinity wounds that all seem to be doing better and are measures smaller today. Fortunately there's no signs of infection. No fevers, chills, nausea, or vomiting noted at this time. 08/18/18 on evaluation today patient actually appears to be doing about the same inverter bilateral lower extremities. She continues to have areas that blistering open as was drain that fortunately nothing too significant. Overall I feel like Drawtex may have done better for her however compared to the collagen. 08/25/18 on evaluation today patient appears to be doing a little bit more poorly today even compared to last time I saw her. Again I'm not exactly sure why she's making worse progress over the past several weeks. I'm beginning to wonder if there is some kind of underlying low level infection causing this issue. I did actually take a culture from the left anterior lower extremity but it was a new wound draining quite a bit at this point. Unfortunately she also seems to be having more pain which is what also makes me worried about the possibility of infection. This is despite never erythema noted at this point. 09/01/18 on evaluation today patient actually appears to be doing a little worse even compared to last  week in regard to bilateral lower extremities. She did go to the hospital on the 16th was given a dose of IV Zosyn and then discharged with a recommendation to continue with the Bactrim that I previously prescribed for her. Nonetheless she is still having a lot of discomfort she tells me as well at this time. This is definitely unfortunate. No fevers, chills, nausea, or vomiting noted at this time. 09/08/18 on evaluation today patient's bilateral lower extremities actually appear to be shown signs of improvement which is good news. Fortunately there does not appear to be any signs of active infection I think the anabiotic is helping in this regard. Overall I'm very pleased with how she is progressing. 09/15/18 patient was actually seen in ER yesterday due to her legs as well unfortunately. She states she's been having a lot of pain and discomfort as well as a lot of drainage. Upon inspection today the patient does have a lot of swelling and drainage I feel like this is more related to lymphedema and poor fluid control than it is to infection based on what I'm seeing. The physician in the emergency department also doubted that the patient was having a significant infection nonetheless I see no evidence of infection obvious at this point although I do see evidence of poor fluid control. She still not using a compression pumps, she is not elevating due to her lift chair as well as her hospital bed being broken, and she really is not keeping her legs up as much as they should be and also has been taking off her wraps. All this combined I think has led to poor fluid control and to be honest she may be somewhat volume overloaded in general as well. I recommend that she may need to contact your physician to see if a prescription for a diuretic would be beneficial in their opinion. As long as this is safe I think it would likely  help her. 09/29/18 on evaluation today patient's left lower extremity actually appears  to be doing quite a bit better. At least compared to last time that I saw her. She still has a large area where she is draining from but there's a lot of new skin speckled trout and in fact there's more new skin that there are open areas of weeping and drainage at this point. This is good news. With regard to the right lower extremity this is doing much better with the only open area that I really see being a dry spot on the right lateral ankle currently. Fortunately there's no signs of active infection at this time which is good news. No fevers, chills, nausea, or vomiting noted at this time. The patient seems somewhat stressed and overwhelmed during the visit today she was very lethargic as such. She does and she is not taking any pain medications at this point. Apparently according to her husband are also in the process of moving which is probably taking its toll on her as well. 10/06/18 on evaluation today patient appears to be doing rather well in regard to her lower extremities compared to last evaluation. Fortunately there's no signs of active infection. She tells me she did have an appointment with her primary. Nonetheless he was concerned that the wounds were somewhat deep based on pictures but we never actually saw her legs. She states that he had her somewhat worried due to the fact that she was fearing now that she was [place] have to have an amputation. With that being said based on what I'm seeing check she looks better this week that she has the last two times I've seen her with much less drainage I'm actually pleased in this regard. That doesn't mean that she's out of the water but again I do not think what the point of talking about education at all in regard to her leg. She is very happy to hear this. She is also not having as much pain as she was having last week. 10/13/18 unfortunately on evaluation today patient still continues to have a significant amount of drainage she's not letting  home health actually apply the compression dressings at this point. She's trying to use of Juxta-Lite of the top of Kerlex and the second layer of the three layer compression wrap. With that being said she just does not seem to be making as good a progress as I would expect if she was having the compression applied and in place on a regular basis. No fevers, chills, nausea, or vomiting noted at this time. 10/20/18 on evaluation today patient appears to be doing a little better in regard to her bilateral lower extremity ulcers. In fact the right lower extremity seems to be healed she doesn't even have any openings at this point left lower extremity though still somewhat macerated seems to be showing signs of new skin growth at multiple locations throughout. Fortunately there's no evidence of active infection at this time. No fevers, chills, nausea, or vomiting noted at this time. 10/27/18 on evaluation today patient appears to be doing much better in regard to her left lower Trinity ulcer. She's been tolerating the laptop complication and has minimal drainage noted at this point. Fortunately there's no signs of active infection at this time. No fevers, chills, nausea, or vomiting noted at this time. 11/03/18 on evaluation today patient actually appears to be doing excellent in regard to her left lower extremity. She is having very little drainage  at this point there does not appear to be any significant signs of infection overall very pleased with how things have gone. She is likewise extremely pleased still and seems to be making wonderful progress week to week. I do believe antibiotics were helpful for her. Her primary care provider did place on amateur clean since I last saw her. 11/17/18 on evaluation today patient appears to be doing worse in regard to her bilateral lower extremities at this point. She is been tolerating the dressing changes without complication. With that being said she typically takes  the [place] off fairly quickly upon arriving home even after being seen here in the clinic and does not allow home health reapply command as part of the dressing at home. Therefore she said no compression essentially since I last saw her as best I can tell. With that being said I think it shows and how much swelling she has in the open wounds that are noted at this point. Fortunately there's no signs of infection but unfortunately if she doesn't get this under control I think she will end up with infection and more significant issues. 11/24/18 on evaluation today patient actually appears to be doing somewhat better in regard to her bilateral lower extremities. 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 Nonetheless that is doing better currently so she should be able to attempt reinitiating the lymphedema pumps at this time. No fevers, chills, nausea, or vomiting noted at this time. 12/01/18 upon evaluation today patient's left lower extremity appears to be doing quite well unfortunately her right lower extremity is not doing nearly as well. She has been tolerating the dressing changes without complication unfortunately she did not keep a wrap on the right at this time. Nonetheless I believe this has led to increased swelling and weeping in the world is actually much larger than during the last evaluation with her. 12/08/18 on evaluation today patient appears to be doing about the same at this point in regard to her right lower extremity. There is some more palatable to touch I'm concerned about the possibility of there being some infection although I think the main issue is she's not keeping her compression wrap on which in turn is not allowing this area to heal appropriately. 12/22/18 on evaluation today patient appears to be doing better in regard to left lower  extremity unfortunately significantly worse in regard to the right lower extremity. The areas of blistering and necrotic superficial tissue have spread and again this does not really appear to be signs of infection and all she just doesn't seem to be doing nearly as well is what she has been in the past. Overall I feel like the Augmentin did absolutely nothing for her she doesn't seem to have any infection again I really didn't think so last time either is more of a potential preventative measure and hoping that this would make some difference but I think the main issue is she's not wearing her compression. She tells me she cannot wear the [place] been we put on she takes it off pretty much upon getting home. Subsequently she worshiped Juxta-Lite when I questioned her about how often she wears it this is no more than three hours a day obviously that leaves 21 hours that she has no compression and this is obviously  not doing well for her. Overall I'm concerned that if things continue to worsen she is at great risk of both infection as well as losing her leg. 01/05/19 on evaluation today patient appears to be doing well in regard to her left lower extremity which he is allowing [place] to wrap and not so well with regard to her right lower extremity which she is not allowing [place] to really wrap and keep the wrap on. She states that it hurts too badly whenever it's wrapped and she ends up having to take it off. She's been using the Juxta-Lite she tells me up to six hours a day although I question whether or not that's really been the case to be honest. Previously she told me three hours today nonetheless obviously the legs as long as the wrap is doing great when she is not is doing much more poorly. 01/12/2019 on evaluation today patient actually appears to be doing a little better in my opinion with regard to her right lower extremity ulcer. She has a small open area on the left lower extremity unfortunately but  again this I think is part of the normal fluctuation of what she is going to have to expect with regard to her legs especially when she is not using her lymphedema pumps on a regular basis. Subsequently based on what I am seeing today I think that she does seem to be doing slightly better with regard to her right lower extremity she did see her primary care provider on Monday they felt she had an infection and placed her on 2 antibiotics. Both Cipro and clindamycin. Subsequently again she seems possibly to be doing a little bit better in regards to the right lower extremity she also tells me however she has been wearing the compression wrap over the past week since I spoke with her as well that is a Kerlix and Coban wrap on the right. No fevers, chills, nausea, vomiting, or diarrhea. 01/19/2019 on evaluation today patient appears to be doing better with regard to her bilateral lower extremities especially the right. I feel like the compression has been beneficial for her which is great news. She did get a call from her primary care provider on her way here today telling her that she did have methicillin- resistant Staphylococcus aureus and he was calling in a couple new antibiotics for her including a ointment to be applied she tells me 3 times a day. With that being said this sounds like likely to be Bactroban which I think could be applied with each dressing/wrap change but I would not be able to accommodate her applying this 3 times a day. She is in agreement with the least doing this we will add that to her orders today. 01/26/2019 on evaluation today patient actually appears to be doing much better with regard to her right lower extremity. Her left lower extremity is also doing quite well all things considering. Fortunately there is no evidence of active infection at this time. No fevers, chills, nausea, vomiting, or diarrhea. 02/02/2019 on evaluation today patient appears to be doing much better  compared to her last evaluation. Little by little off like her right leg is returning more towards normal. There does not appear to be any signs of active infection and overall she seems to be doing quite well which is great news. I am very pleased in this regard. No fevers, chills, nausea, vomiting, or diarrhea. 02/09/2019 upon evaluation today patient appears to be doing better  with regard to her bilateral lower extremities. She has been tolerating the dressing changes without complication. Fortunately there is no signs of active infection at this time. No fevers, chills, nausea, vomiting, or diarrhea. 02/23/2019 on evaluation today patient actually appears to be doing quite well with regard to her bilateral lower extremities. She has been tolerating the dressing changes without complication. She is even used her pumps one time and states that she really felt like it felt good. With that being said she seems to be in good spirits and her legs appear to be doing excellent. 03/09/2019 on evaluation today patient appears to be doing well with regard to her right lower extremity there are no open wounds at this time she is having some discomfort but I feel like this is more neuropathy than anything. With regard to her left lower extremity she had several areas scattered around that she does have some weeping and drainage from but again overall she does not appear to be having any significant issues and no evidence of infection at this time which is good news. 03/23/2019 on evaluation today patient appears to be doing well with regard to her right lower extremity which she tells me is still close she is using her juxta light here. Her left lower extremity she mainly just has an area on the foot which is still slightly draining although this also is doing great. Overall very pleased at this time. 04/06/2019 patient appears to be doing a little bit worse in regard to her left lower extremity upon evaluation  today. She feels like this could be becoming infected again which she had issues with previous. Fortunately there is no signs of systemic infection but again this is always a struggle with her with her legs she will go from doing well to not so well in a very short amount of time. 04/20/2019 on evaluation today patient actually appears to be doing quite well with regard to her right lower extremity I do not see any signs of active infection at this time. Fortunately there is no fever chills noted. She is still taking the antibiotics which I prescribed for her at this point. In regard to the left lower extremity I do feel like some of these areas are better although again she still is having weeping from several locations at this time. 04/27/2019 on evaluation today patient appears to be doing about the same if not slightly worse in regard to her left lower extremity ulcers. She tells me when questioned that she has been sleeping in her Hoveround chair in fact she tells me she falls asleep without even knowing it. I think she is spending a whole lot of time in the chair and less time walking and moving around which is not good for her legs either. On top of that she is in a seated position which is also the worst position she is not really elevating her legs and she is also not using her lymphedema pumps. All this is good to contribute to worsening of her condition in general. 05/18/2019 on evaluation today patient appears to be doing well with regard to her lower extremity on the right in fact this is showing no signs of any open wounds at this time. On the left she is continuing to have issues with areas that do drain. Some of the regions have healed and there are couple areas that have reopened. She did go to the ER per the patient according to recommendations from the  home health nurse due to what she was seen when she came out on 05/13/2019. Subsequently she felt like the patient needed to go to the  hospital due to the fact that again she was having "milky white discharge" from her leg. Nonetheless she had and then was placed on doxycycline and subsequently seems to be doing better. 06/01/2019 upon evaluation today patient appears to be doing really in my opinion about the same. I do not see any signs of active infection which is good news. Overall she still has wounds over the bilateral lower extremities she has reopened on the right but this appears to be more of a crack where there is weeping/edema coming from the region. I do not see any evidence of infection at either site based on what I visualized today. 07/13/2019 upon evaluation today patient appears to be doing a little worse compared to last time I saw her. She since has been in the hospital from 06/21/2019 through 06/29/2019. This was secondary to having Covid. During that time they did apply lotion to her legs which unfortunately has caused her to develop a myriad of open wounds on her lower extremities. Her legs do appear to be doing better as far as the overall appearance is concerned but nonetheless she does have more open and weeping areas. 07/27/2019 upon evaluation today patient appears to be doing more poorly to be honest in regard to her left lower extremity in particular. There is no signs of systemic infection although I do believe she may have local infection. She notes she has been having a lot of blue/green drainage which is consistent potentially with Pseudomonas. That may be something that we need to consider here as well. The doxycycline does not seem to have been helping. 08/03/2019 upon evaluation today patient appears to be doing a little better in my opinion compared to last week's evaluation. Her culture I did review today and she is on appropriate medications to help treat the Enterobacter that was noted. Overall I feel like that is good news. With that being said she is unfortunately continuing to have a lot of  drainage and though it is doing better I still think she has a long ways to go to get things dried up in general. Fortunately there is no signs of systemic infection. 08/10/2019 upon evaluation today patient appears to be doing may be slightly better in regard to her left lower extremity the right lower extremity is doing much better. Fortunately there is no signs of infection right now which is good news. No fevers, chills, nausea, vomiting, or diarrhea. 08/24/2019 on evaluation today patient appears to be doing slightly better in regard to her lower extremities. The left lower extremity seems to be healed the right lower extremity is doing better though not completely healed as far as the openings are concerned. She has some generalized issues here with edema and weeping secondary to her lymphedema though again I do believe this is little bit drier compared to prior weeks evaluations. In general I am very pleased with how things seem to be progressing. No fevers, chills, nausea, vomiting, or diarrhea. 08/31/2019 upon evaluation today patient actually seems to making some progress here with regard to the left lower extremity in particular. She has been tolerating the dressing changes without complication. Fortunately there is no signs of active infection at this time. No fevers, chills, nausea, vomiting, or diarrhea. She did see Dr. Doren Custard and he did note that she did have a issue  with the left great saphenous vein and the small saphenous vein in the leg. With that being said he was concerned about the possibility of laser ablation not being extremely successful. He also mentioned a small risk of DVT associated with the procedure. However if the wounds do not continue to improve he stated that that would probably be the way to go. Fortunately the patient's legs do seem to be doing much better. 09/07/2019 upon evaluation today patient appears to be doing better with regard to her lower extremities. She has  been tolerating the dressing changes without complication. With that being said she is showing signs of improvement and overall very pleased. There are some areas on her leg that I think we do need to debride we discussed this last week the patient is in agreement with doing that as long as it does not hurt too badly. 09/14/2019 upon evaluation today patient appears to be doing decently well with regard to her left lower extremity. She is not having near as much weeping as she has had in the past things seem to be drying up which is good news. There is no signs of active infection at this time. 09/21/19 upon evaluation today patient appears to be doing better in regard overall to her bilateral lower extremities. She again has less open than she did previous and each week I feel like this is getting better. Fortunately there is no signs of active infection at this time. No fevers, chills, nausea, vomiting, or diarrhea. 09/28/2019 upon evaluation today patient actually appears to be showing signs of improvement with regard to her left lower extremity. Unfortunately the right medial lower extremity around the ankle region has reopened to some degree but this appears to be minimal still which is good news. There is no signs of active infection at this time which is also good news. 10/12/2019 upon evaluation today patient appears to be doing okay with regard to her bilateral lower extremities today. The right is a little bit worse then last evaluation 2 weeks ago. The left is actually doing a little better in my opinion. Overall there is no signs of active infection at this time that I see. Obviously that something we have to keep a close eye on she is very prone to this with the significant and multiple openings that she has over the bilateral lower extremities. 10/19/2019 upon evaluation today patient appears to be doing about the best that I have seen her in quite some time. She has been tolerating the  dressing changes without complication. There does not appear to be any signs of active infection and overall I am extremely happy with the way her legs appeared. She is drying up quite nicely and overall is having less pain. 11/09/2019 upon evaluation today patient appears to be doing better in regard to her wounds. She seems to be drying up more and more each time I see her this is just taking a very long time. Fortunately there is no signs of active infection at this time. 11/23/2019 upon evaluation today patient actually appears to be doing excellent in regard to her lower extremities at this point compared to where she has been. Fortunately there is no signs of active infection at this time. She did go to the hospital last week for nausea and vomiting completely unrelated to her wounds. Fortunately she is doing better she was given some Reglan and got better. She had associated abdominal pain but they never found out what was  going on. 12/07/2019 upon evaluation today patient appears to be doing well for the most part in regard to her legs. She unfortunately has not been keeping the Coban portion of her wraps on therefore the compression has not really been sufficient for what it is supposed to be. Nonetheless she tells me that it just hurt too bad therefore she removed it. 12/21/2019 upon evaluation today patient actually appears to be doing quite well with regard to her legs. I do feel like she has been making progress which is great news and overall there is no signs of active infection at this time. No fevers, chills, nausea, vomiting, or diarrhea. 01/04/2020 upon evaluation today patient presents for follow-up concerning her lower extremity edema bilaterally. She still has open wounds she has not been using her lymphedema pumps. She is also not been utilizing her compression wraps appropriately she tends to unwrap them, take them off, or states that they hurt. Obviously the reason they hurt is  because her legs start to swell but the issue is if she would use her compression/lymphedema pumps regularly she would not swell and she would have the pain. Nonetheless she has not even picked them up once honestly over the past several months and may be even as much as in the past year based on my opinion and what have seen. She tells me today that after last week when I talked about this with her specifically actually that was 2 weeks ago that she "forgot". 8//21 on evaluation today patient appears to be doing a little better in regard to her legs bilaterally. Fortunately there is no signs of active infection at this time. She tells me that she used her lymphedema pumps all of one time over the past 2 weeks since I last saw her. She tells me that she has been too busy in order to continue to use these. 02/01/2020 on evaluation today patient appears to be doing some better in regard to her wounds in general in her legs. We felt the right was healed although is not completely it does appear to be doing better she tells me she has been using her lymphedema pumps that she has had this six times since I last saw her. Obviously the more she does that the better she would do my opinion Objective Constitutional Well-nourished and well-hydrated in no acute distress. Vitals Time Taken: 11:21 AM, Height: 62 in, Weight: 335 lbs, BMI: 61.3, Temperature: 98.4 F, Pulse: 84 bpm, Respiratory Rate: 18 breaths/min, Blood Pressure: 156/71 mmHg, Capillary Blood Glucose: 85 mg/dl. Respiratory normal breathing without difficulty. Psychiatric this patient is able to make decisions and demonstrates good insight into disease process. Alert and Oriented x 3. pleasant and cooperative. General Notes: Patient's wound bed currently showed signs of good granulation at this time there does not appear to be any evidence of active infection at this point which is also good news. Overall very pleased with where things stand  today. Integumentary (Hair, Skin) Wound #61 status is Open. Original cause of wound was Gradually Appeared. The wound is located on the Left,Circumferential Lower Leg. The wound measures 15cm length x 24.5cm width x 0.1cm depth; 288.634cm^2 area and 28.863cm^3 volume. There is Fat Layer (Subcutaneous Tissue) Exposed exposed. There is no tunneling or undermining noted. There is a large amount of serous drainage noted. The wound margin is flat and intact. There is medium (34-66%) pink, pale granulation within the wound bed. There is a medium (34-66%) amount of necrotic tissue within  the wound bed including Adherent Slough. Wound #64 status is Open. Original cause of wound was Gradually Appeared. The wound is located on the Left,Dorsal Foot. The wound measures 7cm length x 6.5cm width x 0.1cm depth; 35.736cm^2 area and 3.574cm^3 volume. There is Fat Layer (Subcutaneous Tissue) Exposed exposed. There is no tunneling or undermining noted. There is a medium amount of serous drainage noted. The wound margin is flat and intact. There is large (67-100%) pink, pale granulation within the wound bed. There is a small (1-33%) amount of necrotic tissue within the wound bed including Adherent Slough. Wound #65 status is Open. Original cause of wound was Gradually Appeared. The wound is located on the Right,Medial Lower Leg. The wound measures 0.3cm length x 0.2cm width x 0.1cm depth; 0.047cm^2 area and 0.005cm^3 volume. There is no tunneling or undermining noted. There is a none present amount of drainage noted. The wound margin is distinct with the outline attached to the wound base. There is no granulation within the wound bed. There is no necrotic tissue within the wound bed. 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 There was a Three Layer Compression Therapy Procedure by Carlene Coria, RN. Post procedure Diagnosis Wound #61: Same as Pre-Procedure Wound #65 Pre-procedure diagnosis of Wound #65 is a Venous Leg Ulcer located on the Right,Medial Lower Leg . There was a Three Layer Compression Therapy Procedure by Carlene Coria, RN. Post procedure Diagnosis Wound #65: Same as Pre-Procedure Plan Follow-up Appointments: Return Appointment in 2 weeks. Dressing Change Frequency: Wound #61 Left,Circumferential Lower Leg: Change dressing three times week. Wound #64 Left,Dorsal Foot: Change dressing three times week. Wound #65 Right,Medial Lower Leg: Change dressing three times week. Skin Barriers/Peri-Wound Care: Barrier cream - zinc oxide cream to any macerated areas Moisturizing lotion - to dry skin Wound Cleansing: Clean wound with Wound Cleanser - all wounds, wash legs with soap and water with dressing changes May shower with protection. Primary Wound Dressing: Wound #61 Left,Circumferential Lower Leg: Calcium Alginate with Silver Wound #64 Left,Dorsal Foot: Calcium Alginate with Silver Wound #65 Right,Medial Lower Leg: Calcium Alginate with Silver Secondary Dressing: Wound #61 Left,Circumferential Lower Leg: Dry Gauze ABD pad - as needed Wound #64 Left,Dorsal Foot: Dry Gauze ABD pad - as needed Wound #65 Right,Medial Lower  Leg: Dry Gauze Edema Control: 3 Layer Compression System - Bilateral Avoid standing for long periods of time - walking is encouraged Elevate legs to the level of the heart or above for 30 minutes daily and/or when sitting, a frequency of: - do not sleep in chair with feet dangling, MUST elevate legs while sitting Exercise regularly Segmental Compressive Device. - lymphedema pumps 60 minutes 1- 2 times per day Off-Loading: Turn and reposition every 2 hours Additional Orders / Instructions: Follow Nutritious Diet - T include vitamin A, vitamin C, and Zinc along with increased protein intake. o  Home Health: [facility] for wound care. - [facility]. I Minna suggest at this time that the patient needs to continue with her lymphedema pumps she should be using this on a regular basis. 2. I am also can recommend the patient needs to continue to be monitored for any signs of worsening or infection we will see her back on an ongoing regular basis. 3. I am to recommend that we continue with the compression wraps I think this is good to be superior to her juxta lites and if she wants the best result she should use that and use her lymphedema pumps over top of those she does not need to remove them in order to use her pumps. This is a three layer compression wrap. We will see patient back for reevaluation in 2 weeks here in the clinic. If anything worsens or changes patient will contact our office for additional recommendations. Electronic Signature(s) Signed: 02/01/2020 11:53:20 AM By: [name] PA-C Entered By: [name] on 02/01/2020 11:53:19 -------------------------------------------------------------------------------- SuperBill Details Patient Name: Date of Service: [name] 02/01/2020 Medical Record Number: [id number] Patient Account Number: [id number] Date of Birth/Sex: Treating RN: [birth year] (69 y.o. 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 Physician Procedures : CPT4 Code Description Modifier [id number] - WC PHYS LEVEL 3 - EST PT ICD-10 Diagnosis Description E11.622 Type 2 diabetes mellitus with other skin ulcer I89.0 Lymphedema, not elsewhere classified I87.331 Chronic venous hypertension (idiopathic) with  ulcer and inflammation of right lower extremity I87.332 Chronic venous hypertension (idiopathic) with ulcer and inflammation of left lower extremity Quantity: 1 Electronic Signature(s) Signed: 02/01/2020 11:53:40 AM By: [name] PA-C Entered By: [name] on 02/01/2020 11:53:38

## 2020-02-11 NOTE — Progress Notes (Signed)
GEORGINE, WILTSE ([id number]) Visit Report for 02/01/2020 Arrival Information Details Patient Name: Date of Service: PIERINA, SCHUKNECHT 02/01/2020 10:45 A M Medical Record Number: [id number] Patient Account Number: [id number] Date of Birth/Sex: Treating RN: Apr 13, 1951 (69 y.o. Martyn Malay, Linda Primary Care Jassiel Flye: [name] Other Clinician: Referring Cleo Villamizar: Treating Kataya Guimont/Extender: Doyle Askew, FRED Weeks in Treatment: 130 Visit Information History Since Last Visit Added or deleted any medications: No Patient Arrived: Wheel Chair Any new allergies or adverse reactions: No Arrival Time: 11:20 Had a fall or experienced change in No Accompanied By: self activities of daily living that may affect Transfer Assistance: None risk of falls: Patient Identification Verified: Yes Signs or symptoms of abuse/neglect since last visito No Secondary Verification Process Completed: Yes Hospitalized since last visit: No Patient Requires Transmission-Based Precautions: No Implantable device outside of the clinic excluding No Patient Has Alerts: Yes cellular tissue based products placed in the center Patient Alerts: R ABI= 1.01 since last visit: L ABI = .99 Has Dressing in Place as Prescribed: Yes Pain Present Now: No Electronic Signature(s) Signed: 02/02/2020 1:30:39 PM By: Sandre Kitty Entered By: Sandre Kitty on 02/01/2020 11:21:59 -------------------------------------------------------------------------------- Compression Therapy Details Patient Name: Date of Service: [name] 02/01/2020 10:45 A M Medical Record Number: [id number] Patient Account Number: [id number] Date of Birth/Sex: Treating RN: 1951/05/21 (69 y.o. [name] Primary Care Sheronica Corey: [name] Other Clinician: Referring Jennesis Ramaswamy: Treating Thai Burgueno/Extender: Doyle Askew, FRED Weeks in Treatment: 130 Compression Therapy Performed for Wound Assessment: Wound #61  Left,Circumferential Lower Leg Performed By: Clinician Carlene Coria, RN Compression Type: Three Layer Post Procedure Diagnosis Same as Pre-procedure Electronic Signature(s) Signed: 02/01/2020 5:37:29 PM By: [name] RN, BSN Entered By: [name] on 02/01/2020 11:49:37 -------------------------------------------------------------------------------- Compression Therapy Details Patient Name: Date of Service: [name] 02/01/2020 10:45 A M Medical Record Number: [id number] Patient Account Number: [id number] Date of Birth/Sex: Treating RN: 04-Oct-1950 (69 y.o. [name] Primary Care Makynlie Rossini: [name] Other Clinician: Referring Cliffton Spradley: Treating Vanissa Strength/Extender: Doyle Askew, FRED Weeks in Treatment: 130 Compression Therapy Performed for Wound Assessment: Wound #65 Right,Medial Lower Leg Performed By: Clinician Carlene Coria, RN Compression Type: Three Layer Post Procedure Diagnosis Same as Pre-procedure Electronic Signature(s) Signed: 02/01/2020 5:37:29 PM By: [name] RN, BSN Entered By: [name] on 02/01/2020 11:49:38 -------------------------------------------------------------------------------- Encounter Discharge Information Details Patient Name: Date of Service: [name] 02/01/2020 10:45 A M Medical Record Number: [id number] Patient Account Number: [id number] Date of Birth/Sex: Treating RN: 06/04/1951 (69 y.o. [name] Primary Care Katessa Attridge: [name] Other Clinician: Referring Ragina Fenter: Treating Twylah Bennetts/Extender: Doyle Askew, FRED Weeks in Treatment: [phone] Encounter Discharge Information Items Discharge Condition: Stable Ambulatory Status: Wheelchair Discharge Destination: Home Transportation: Private Auto Accompanied By: self Schedule Follow-up Appointment: Yes Clinical Summary of Care: Patient Declined Electronic Signature(s) Signed: 02/10/2020 5:50:16 PM By: Carlene Coria  RN Entered By: Carlene Coria on 02/01/2020 12:16:52 -------------------------------------------------------------------------------- Lower Extremity Assessment Details Patient Name: Date of Service: BALEY, SHANDS 02/01/2020 10:45 A M Medical Record Number: [id number] Patient Account Number: [id number] Date of Birth/Sex: Treating RN: 08-08-1950 (69 y.o. [name] Primary Care Leasa [name]: [name] Other Clinician: Referring Marrion Accomando: Treating Micala Saltsman/Extender: Doyle Askew, FRED Weeks in Treatment: 130 Edema Assessment Assessed: [Left: No] [Right: No] Edema: [Left: Yes] [Right: Yes] Calf Left: Right: Point of Measurement: 37 cm From Medial Instep 42 cm 37 cm Ankle Left: Right: Point of Measurement: 10 cm From Medial Instep 29 cm 23  cm Vascular Assessment Pulses: Dorsalis Pedis Palpable: [Left:Yes] [Right:Yes] Electronic Signature(s) Signed: 02/02/2020 5:36:57 PM By: Levan Hurst RN, BSN Entered By: Levan Hurst on 02/01/2020 11:42:39 -------------------------------------------------------------------------------- [place] Details Patient Name: Date of Service: [name] 02/01/2020 10:45 A M Medical Record Number: [id number] Patient Account Number: [id number] Date of Birth/Sex: Treating RN: 05/08/1951 (69 y.o. [name] Primary Care Kalea Perine: [name] Other Clinician: Referring Yuleni Burich: Treating Kincade Granberg/Extender: Doyle Askew, FRED Weeks in Treatment: [phone] Active Inactive Venous Leg Ulcer Nursing Diagnoses: Actual venous Insuffiency (use after diagnosis is confirmed) Knowledge deficit related to disease process and management Goals: Patient will maintain optimal edema control Date Initiated: 08/12/2017 Target Resolution Date: 02/15/2020 Goal Status: Active Patient/caregiver will verbalize understanding of disease process and disease management Date Initiated: 08/12/2017 Date Inactivated:  04/21/2018 Target Resolution Date: 04/24/2018 Goal Status: Met Interventions: Assess peripheral edema status every visit. Compression as ordered Treatment Activities: Therapeutic compression applied : 08/12/2017 Notes: Wound/Skin Impairment Nursing Diagnoses: Impaired tissue integrity Knowledge deficit related to ulceration/compromised skin integrity Goals: Patient/caregiver will verbalize understanding of skin care regimen Date Initiated: 08/12/2017 Target Resolution Date: 02/15/2020 Goal Status: Active Ulcer/skin breakdown will have a volume reduction of 30% by week 4 Date Initiated: 08/05/2017 Date Inactivated: 09/30/2017 Target Resolution Date: 10/03/2017 Goal Status: Met Ulcer/skin breakdown will have a volume reduction of 50% by week 8 Date Initiated: 09/30/2017 Date Inactivated: 10/28/2017 Target Resolution Date: 10/28/2017 Goal Status: Met Interventions: Assess patient/caregiver ability to perform ulcer/skin care regimen upon admission and as needed Assess ulceration(s) every visit Provide education on ulcer and skin care Screen for HBO Treatment Activities: Patient referred to home care : 08/05/2017 Skin care regimen initiated : 08/05/2017 Topical wound management initiated : 08/05/2017 Notes: Electronic Signature(s) Signed: 02/01/2020 5:37:29 PM By: [name] RN, BSN Entered By: [name] on 02/01/2020 11:46:51 -------------------------------------------------------------------------------- Pain Assessment Details Patient Name: Date of Service: [name] 02/01/2020 10:45 A M Medical Record Number: [id number] Patient Account Number: [id number] Date of Birth/Sex: Treating RN: Sep 10, 1950 (69 y.o. [name] Primary Care Darlina Mccaughey: [name] Other Clinician: Referring Clemens Lachman: Treating Rukaya Kleinschmidt/Extender: Doyle Askew, FRED Weeks in Treatment: [phone] Active Problems Location of Pain Severity and Description of Pain Patient Has Paino  No Site Locations Pain Management and Medication Current Pain Management: Electronic Signature(s) Signed: 02/01/2020 5:37:29 PM By: [name] RN, BSN Signed: 02/02/2020 1:30:39 PM By: Sandre Kitty Entered By: Sandre Kitty on 02/01/2020 11:22:30 -------------------------------------------------------------------------------- Patient/Caregiver Education Details Patient Name: Date of Service: [name] 8/18/2021andnbsp10:45 [place] Record Number: [id number] Patient Account Number: [id number] Date of Birth/Gender: Treating RN: 04/29/1951 (69 y.o. [name] Primary Care Physician: [name] Other Clinician: Referring Physician: Treating Physician/Extender: Doyle Askew, FRED Weeks in Treatment: [phone] Education Assessment Education Provided To: Patient Education Topics Provided Venous: Methods: Explain/Verbal Responses: Reinforcements needed, State content correctly Wound/Skin Impairment: Methods: Explain/Verbal Responses: Reinforcements needed, State content correctly Electronic Signature(s) Signed: 02/01/2020 5:37:29 PM By: [name] RN, BSN Entered By: [name] on 02/01/2020 11:47:54 -------------------------------------------------------------------------------- Wound Assessment Details Patient Name: Date of Service: [name] 02/01/2020 10:45 A M Medical Record Number: [id number] Patient Account Number: [id number] Date of Birth/Sex: Treating RN: 07/15/1950 (69 y.o. [name] Primary Care Caylin Raby: [name] Other Clinician: Referring Sohil Timko: Treating Beverlyann Broxterman/Extender: Doyle Askew, FRED Weeks in Treatment: 130 Wound Status Wound Number: 61 Primary Venous Leg Ulcer Etiology: Wound Location: Left, Circumferential Lower Leg Wound Open Wounding Event: Gradually Appeared Status: Date Acquired:  04/20/2019 Comorbid Asthma, Hypertension, Peripheral Arterial Disease, Peripheral Weeks Of  Treatment: 41 History: Venous Disease, Type II Diabetes, Gout, Osteoarthritis Clustered Wound: Yes Photos Photo Uploaded By: Jones, Dedrick on 02/02/2020 14:49:44 Wound Measurements Length: (cm) 15 Width: (cm) 24.5 Depth: (cm) 0.1 Clustered Quantity: 3 Area: (cm) 288.634 Volume: (cm) 28.863 % Reduction in Area: -3728% % Reduction in Volume: -3728% Epithelialization: Small (1-33%) Tunneling: No Undermining: No Wound Description Classification: Full Thickness Without Exposed Support Structures Wound Margin: Flat and Intact Exudate Amount: Large Exudate Type: Serous Exudate Color: amber Foul Odor After Cleansing: No Slough/Fibrino Yes Wound Bed Granulation Amount: Medium (34-66%) Exposed Structure Granulation Quality: Pink, Pale Fascia Exposed: No Necrotic Amount: Medium (34-66%) Fat Layer (Subcutaneous Tissue) Exposed: Yes Necrotic Quality: Adherent Slough Tendon Exposed: No Muscle Exposed: No Joint Exposed: No Bone Exposed: No Treatment Notes Wound #61 (Left, Circumferential Lower Leg) 1. Cleanse With Wound Cleanser 2. Periwound Care Barrier cream 3. Primary Dressing Applied Calcium Alginate Ag 4. Secondary Dressing ABD Pad Dry Gauze 6. Support Layer Applied 3 layer compression wrap Electronic Signature(s) Signed: 02/01/2020 5:37:29 PM By: Boehlein, Linda RN, BSN Signed: 02/02/2020 5:36:57 PM By: Lynch, Shatara RN, BSN Entered By: Lynch, Shatara on 02/01/2020 11:40:40 -------------------------------------------------------------------------------- Wound Assessment Details Patient Name: Date of Service: Gearing, Miho M. 02/01/2020 10:45 A M Medical Record Number: [id number] Patient Account Number: [id number] Date of Birth/Sex: Treating RN: 02/02/1951 (68 y.o. F) Boehlein, Linda Primary Care : SMITH, FRED Other Clinician: Referring : Treating /Extender: Stone III, Hoyt SMITH, FRED Weeks in Treatment: 130 Wound Status Wound  Number: 64 Primary Diabetic Wound/Ulcer of the Lower Extremity Etiology: Wound Location: Left, Dorsal Foot Wound Open Wounding Event: Gradually Appeared Status: Date Acquired: 06/01/2019 Comorbid Asthma, Hypertension, Peripheral Arterial Disease, Peripheral Weeks Of Treatment: 35 History: Venous Disease, Type II Diabetes, Gout, Osteoarthritis Clustered Wound: No Photos Photo Uploaded By: Jones, Dedrick on 02/02/2020 14:49:30 Wound Measurements Length: (cm) 7 Width: (cm) 6.5 Depth: (cm) 0.1 Area: (cm) 35.736 Volume: (cm) 3.574 % Reduction in Area: -21558.2% % Reduction in Volume: -7193.9% Epithelialization: Small (1-33%) Tunneling: No Undermining: No Wound Description Classification: Grade 1 Wound Margin: Flat and Intact Exudate Amount: Medium Exudate Type: Serous Exudate Color: amber Foul Odor After Cleansing: No Slough/Fibrino Yes Wound Bed Granulation Amount: Large (67-100%) Exposed Structure Granulation Quality: Pink, Pale Fascia Exposed: No Necrotic Amount: Small (1-33%) Fat Layer (Subcutaneous Tissue) Exposed: Yes Necrotic Quality: Adherent Slough Tendon Exposed: No Muscle Exposed: No Joint Exposed: No Bone Exposed: No Treatment Notes Wound #64 (Left, Dorsal Foot) 1. Cleanse With Wound Cleanser 2. Periwound Care Barrier cream 3. Primary Dressing Applied Calcium Alginate Ag 4. Secondary Dressing ABD Pad Dry Gauze 6. Support Layer Applied 3 layer compression wrap Electronic Signature(s) Signed: 02/01/2020 5:37:29 PM By: Boehlein, Linda RN, BSN Signed: 02/02/2020 5:36:57 PM By: Lynch, Shatara RN, BSN Entered By: Lynch, Shatara on 02/01/2020 11:41:03 -------------------------------------------------------------------------------- Wound Assessment Details Patient Name: Date of Service: Skop, Alyse M. 02/01/2020 10:45 A M Medical Record Number: [id number] Patient Account Number: [id number] Date of Birth/Sex: Treating RN: 05/18/1951 (68 y.o. F)  Boehlein, Linda Primary Care : SMITH, FRED Other Clinician: Referring : Treating /Extender: Stone III, Hoyt SMITH, FRED Weeks in Treatment: 130 Wound Status Wound Number: 65 Primary Venous Leg Ulcer Etiology: Wound Location: Right, Medial Lower Leg Wound Open Wounding Event: Gradually Appeared Status: Date Acquired: 09/28/2019 Comorbid Asthma, Hypertension, Peripheral Arterial Disease, Peripheral Weeks Of Treatment: 18 History: Venous Disease, Type II Diabetes, Gout, Osteoarthritis Clustered Wound: No Photos Photo Uploaded By: Jones, Dedrick   on 02/02/2020 14:49:17 Wound Measurements Length: (cm) 0.3 Width: (cm) 0.2 Depth: (cm) 0.1 Area: (cm) 0.047 Volume: (cm) 0.005 % Reduction in Area: 95.7% % Reduction in Volume: 95.5% Epithelialization: Large (67-100%) Tunneling: No Undermining: No Wound Description Classification: Full Thickness Without Exposed Support Structures Wound Margin: Distinct, outline attached Exudate Amount: None Present Foul Odor After Cleansing: No Slough/Fibrino No Wound Bed Granulation Amount: None Present (0%) Exposed Structure Necrotic Amount: None Present (0%) Fascia Exposed: No Fat Layer (Subcutaneous Tissue) Exposed: No Tendon Exposed: No Muscle Exposed: No Joint Exposed: No Bone Exposed: No Treatment Notes Wound #65 (Right, Medial Lower Leg) 1. Cleanse With Wound Cleanser 2. Periwound Care Barrier cream 3. Primary Dressing Applied Calcium Alginate Ag 4. Secondary Dressing ABD Pad Dry Gauze 6. Support Layer Applied 3 layer compression Water quality scientist) Signed: 02/01/2020 5:37:29 PM By: [name] RN, BSN Entered By: [name] on 02/01/2020 11:48:56 -------------------------------------------------------------------------------- [place] Details Patient Name: Date of Service: [name] 02/01/2020 10:45 A M Medical Record Number: [id number] Patient Account Number:  [id number] Date of Birth/Sex: Treating RN: 10-Jun-1951 (69 y.o. [name] Primary Care Antjuan Rothe: [name] Other Clinician: Referring Guiselle Mian: Treating Ciela Mahajan/Extender: Doyle Askew, FRED Weeks in Treatment: 130 Vital Signs Time Taken: 11:21 Temperature (F): 98.4 Height (in): 62 Pulse (bpm): 84 Weight (lbs): 335 Respiratory Rate (breaths/min): 18 Body Mass Index (BMI): 61.3 Blood Pressure (mmHg): 156/71 Capillary Blood Glucose (mg/dl): 85 Reference Range: 80 - 120 mg / dl Electronic Signature(s) Signed: 02/02/2020 1:30:39 PM By: Sandre Kitty Entered By: Sandre Kitty on 02/01/2020 11:22:24

## 2020-02-15 ENCOUNTER — Encounter (HOSPITAL_BASED_OUTPATIENT_CLINIC_OR_DEPARTMENT_OTHER): Payer: Medicare PPO | Attending: Physician Assistant | Admitting: Physician Assistant

## 2020-02-15 ENCOUNTER — Other Ambulatory Visit: Payer: Self-pay

## 2020-02-15 DIAGNOSIS — G43909 Migraine, unspecified, not intractable, without status migrainosus: Secondary | ICD-10-CM | POA: Diagnosis not present

## 2020-02-15 DIAGNOSIS — I87311 Chronic venous hypertension (idiopathic) with ulcer of right lower extremity: Secondary | ICD-10-CM | POA: Insufficient documentation

## 2020-02-15 DIAGNOSIS — E1151 Type 2 diabetes mellitus with diabetic peripheral angiopathy without gangrene: Secondary | ICD-10-CM | POA: Diagnosis not present

## 2020-02-15 DIAGNOSIS — I10 Essential (primary) hypertension: Secondary | ICD-10-CM | POA: Diagnosis not present

## 2020-02-15 DIAGNOSIS — J45909 Unspecified asthma, uncomplicated: Secondary | ICD-10-CM | POA: Insufficient documentation

## 2020-02-15 DIAGNOSIS — E11622 Type 2 diabetes mellitus with other skin ulcer: Secondary | ICD-10-CM | POA: Insufficient documentation

## 2020-02-15 DIAGNOSIS — I872 Venous insufficiency (chronic) (peripheral): Secondary | ICD-10-CM | POA: Diagnosis not present

## 2020-02-15 DIAGNOSIS — L97522 Non-pressure chronic ulcer of other part of left foot with fat layer exposed: Secondary | ICD-10-CM | POA: Insufficient documentation

## 2020-02-15 DIAGNOSIS — I890 Lymphedema, not elsewhere classified: Secondary | ICD-10-CM | POA: Insufficient documentation

## 2020-02-15 DIAGNOSIS — L97822 Non-pressure chronic ulcer of other part of left lower leg with fat layer exposed: Secondary | ICD-10-CM | POA: Diagnosis not present

## 2020-02-15 DIAGNOSIS — Z9119 Patient's noncompliance with other medical treatment and regimen: Secondary | ICD-10-CM | POA: Diagnosis not present

## 2020-02-15 DIAGNOSIS — M5432 Sciatica, left side: Secondary | ICD-10-CM | POA: Insufficient documentation

## 2020-02-15 DIAGNOSIS — Z6841 Body Mass Index (BMI) 40.0 and over, adult: Secondary | ICD-10-CM | POA: Diagnosis not present

## 2020-02-15 DIAGNOSIS — M109 Gout, unspecified: Secondary | ICD-10-CM | POA: Insufficient documentation

## 2020-02-15 DIAGNOSIS — F329 Major depressive disorder, single episode, unspecified: Secondary | ICD-10-CM | POA: Insufficient documentation

## 2020-02-15 DIAGNOSIS — L97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 leg with fat layer exposed: Secondary | ICD-10-CM | POA: Diagnosis not present

## 2020-02-15 DIAGNOSIS — E1140 Type 2 diabetes mellitus with diabetic neuropathy, unspecified: Secondary | ICD-10-CM | POA: Diagnosis not present

## 2020-02-15 DIAGNOSIS — I69351 Hemiplegia and hemiparesis following cerebral infarction affecting right dominant side: Secondary | ICD-10-CM | POA: Diagnosis not present

## 2020-02-15 DIAGNOSIS — M1990 Unspecified osteoarthritis, unspecified site: Secondary | ICD-10-CM | POA: Diagnosis not present

## 2020-02-15 DIAGNOSIS — K449 Diaphragmatic hernia without obstruction or gangrene: Secondary | ICD-10-CM | POA: Diagnosis not present

## 2020-02-15 NOTE — Progress Notes (Addendum)
PURVA, VESSELL ([id number]) Visit Report for 02/15/2020 Chief Complaint Document Details Patient Name: Date of Service: PETREA, [place] 02/15/2020 10:45 A M Medical Record Number: [id number] Patient Account Number: [id number] Date of Birth/Sex: Treating RN: 15-Oct-1950 (69 y.o. [name] Primary Care Provider: [name] Other Clinician: Referring Provider: Treating Provider/Extender: Doyle Askew, FRED Weeks in Treatment: [phone] Information Obtained from: Patient Chief Complaint Bilateral reoccurring LE ulcers Electronic Signature(s) Signed: 02/15/2020 11:53:07 AM By: [name] PA-C Entered By: [name] on 02/15/2020 11:53:06 -------------------------------------------------------------------------------- HPI Details Patient Name: Date of Service: [name]. 02/15/2020 10:45 A M Medical Record Number: [id number] Patient Account Number: [id number] Date of Birth/Sex: Treating RN: 1951-02-09 (69 y.o. [name] Primary Care Provider: [name] Other Clinician: Referring Provider: Treating Provider/Extender: Doyle Askew, FRED Weeks in Treatment: 132 History of Present Illness HPI Description: this patient has been seen a couple of times before and returns with recurrent problems to her right and left lower extremity with swelling and weeping ulcerations due to not wearing her compression stockings which she had been advised to do during her last discharge, at the end of June 2018. During her last visit the patient had had normal arterial blood flow and her venous reflux study did not necessitate any surgical intervention. She was recommended compression and elevation and wound care. After prolonged treatment the patient was completely healed but she has been noncompliant with wearing or compressions.. She was here last week with an outpatient return visit planned but the patient came in a very poor general condition with altered  mental status and was rushed to the ER on my request. With a history of hypertension, diabetes, TIA and right-sided weakness she was set up for an MRI on her brain and cervical spine and was sent to [facility]. Getting an MRI done was very difficult but once the workup was done she was found not to have any spinal stenosis, epidural abscess or hematoma or discitis. This was radiculopathy to be treated as an outpatient and she was given a follow-up appointment. Today she is feeling much better alert and oriented and has come to reevaluate her bilateral lower extremity lymphedema and ulceration 03/25/2017 -- she was admitted to the hospital on 03/16/2017 and discharged on 03/18/2017 with left leg cellulitis and ulceration. She was started on vancomycin and Zosyn and x-ray showed no bony involvement. She was treated for a cellulitis with IV antibiotics changed to Rocephin and Flagyl and was discharged on oral Keflex and doxycycline to complete a 7 day course. Last hemoglobin A1c was 7.1 and her other ailments including hypertension got asthma were appropriately treated. 05/06/2017 -- she is awaiting the right size of compression stockings from [place] but other than that has been doing well. ====== Old notes 69 year old patient was seen one time last October and was lost to follow-up. She has recurrent problems with weeping and ulceration of her left lower extremity and has swelling of this for several years. It has been worse for the last 2 months. Past medical history is significant for diabetes mellitus type 2, hypertension, gout, morbid obesity, depressive disorders, hiatal hernia, migraines, status post knee surgery, risk of a cholecystectomy, vaginal hysterectomy and breast biopsy. She is not a smoker. As noted before she has never had a venous duplex study and an arterial ABI study was attempted but the left lower extremity was noncompressible 10/01/2016 -- had a lower extremity venous duplex  reflux evaluation which showed  no evidence of deep vein reflux in the right or left lower extremity, and no evidence of great saphenous vein reflux more than 500 ms in the right or left lower extremity, and the left small saphenous vein is incompetent but no vascular consult was recommended. review of her electronic medical records noted that the ABI was checked in July 2017 where the right ABI was normal limits and the left ABI could not be ascertained due to pain with cuff pressure but the waveforms are within normal limits. her arterial duplex study scheduled for April 27. 10/08/2016 -- the patient has various reasons for not having a compression on and for the last 3 days she has had no compression on her left lower extremity either due to pain or the lack of nursing help. She does not use her juxta lites either. 10/15/2016 -- the patient did not keep her appointment for arterial duplex study on April 27 and I have asked her to reschedule this. Her pain is out of proportion with the physical findings and she continuously fails to wear a compression wraps and cuts them off because she says she cannot tolerate the pain. She does not use her juxta lites either. 10/22/2016 -- he has rescheduled her arterial duplex study to May 21 and her pain today is a bit better. She has not been wearing her juxta lites on her right lower extremity but now understands that she needs to do this. She did tolerate the to press compression wrap on her left lower extremity 10/29/2016 --arterial duplex study is scheduled for next week and overall she has been tolerating her compression wraps and also using her juxta lites on her right lower extremity 11/05/2016 -- the right ABI was 0.95 the left was 1.03. The digit TBI is on the right was 0.83 on the left was 0.92 and she had biphasic flow through these vessels. The impression was that of normal lower extremity arterial study. 11/12/2016 -- her pain is minimal and she  is doing very well overall. 11/26/2016 -- she has got juxta lites and her insurance will not pay for additional dual layer compression stockings. She is going to order some from [place]. 05/12/2017 -- her juxta lites are very old and too big for her and these have not been helping with compression. She did get 20-30 mm compression stockings from [facility] but she and her husband are unable to put these on. I believe she will benefit from bilateral Extremit-ease, compression stockings and we will measure her for these today. 05/20/2017 -- lymphedema on the left lower extremity has increased a lot and she has a open ulceration as a result of this. The right lower extremity is looking pretty good. She has decided to by the compression stockings herself and will get reimbursed by the home health, at a later date. 05/27/2017 -- her sciatica is bothering her a lot and she thought her left leg pain was caused due to the compression wrap and hence removed it and has significant lymphedema. There is no inflammation on this left lower extremity. 06/17/17 on evaluation today patient appears to be doing very well and in fact is completely healed in regard to her ulcerations. Unfortunately however she does have continued issues with lymphedema nonetheless. We did order compression garments for her unfortunately she states that the size that she received were large although we ordered medium. Obviously this means she is not getting the optimal compression. She does not have those with her today and therefore  we could not confirm and contact the company on her behalf. Nonetheless she does state that she is going to have her husband bring them by tomorrow so that we can verify and then get in touch with the company. No fevers, chills, nausea, or vomiting noted at this time. Overall patient is doing better otherwise and I'm pleased with the progress she has made. 07/01/17 on evaluation today patient appears to be doing  very well in regard to her bilateral lower extremity she does not have any openings at this point which is excellent news. Overall I'm pleased with how things have progressed up to this time. Since she is doing so well we did order her compression which we are seeing her today to ensure that it fits her properly and everything is doing well in that regard and then subsequently she will be discharged. ============ Old Notes: 03/31/16 patient presents today for evaluation concerning open wounds that she has over the left medial ankle region as well as the left dorsal foot. She has previously had this occur although it has been healed for a number of months after having this for about a year prior until her hospitalization on 01/05/16. At that point in time it appears that she was admitted to the hospital for left lower extremity cellulitis and was placed on vancomycin and Zosyn at that point. Eventually upon discharge on January 15, 2016 she was placed on doxycycline at that point in time. Later on 03/27/16 positive wound culture growing Escherichia coli this was switched to amoxicillin. Currently she tells me that she is having pain radiated to be a 7 out of 10 which can be as high as 10 out of 10 with palpation and manipulation of the wound. This wound appears to be mainly venous in nature due to the bilateral lower extremity venous stasis/lymphedema. This is definitely much worse on her left than the right side. She does have type 1 diabetes mellitus, hypertension, morbid obesity, and is wheelchair dependent.during the course of the hospital stay a blood culture was also obtained and fortunately appeared negative. She also had an x-ray of the tibia/fibula on the left which showed no acute bone abnormality. Her white blood cell count which was performed last on 03/25/16 was 7.3, hemoglobin 12.8, protein 7.1, albumin 3.0. Her urine culture appeared to be negative for any specific organisms. Patient did  have a left lower extremity venous duplex evaluation for DVT . This did not include venous reflux studies but fortunately was negative for DVT Patient also had arterial studies performed which revealed that she had a . normal ABI on the right though this was unable to be performed on the left secondary to pain that she was having around the ankle region due to the wound. However it was stated on report that she had biphasic pulses and apparently good blood flow. ========== 06/03/17 she is here in follow-up evaluation for right lower extremity ulcer. The right lower sure he has healed but she has reopened to the left medial malleolus and dorsal foot with weeping. She is waiting for new compression garments to arrive from home health, the previous compression garments were ill fitting. We will continue with compression bilaterally and follow-up in 2 weeks Readmission: 08/05/17 on evaluation today patient appears to be doing somewhat poorly in regard to her left lower extremity especially although the right lower extremity has a small area which may no longer be open. She has been having a lot of drainage from the  left lower extremity however he tells me that she has not been able to use the EXTREMIT-EASE Compression at this point. She states that she did better and was able to actually apply the Juxta-Lite compression although the wound that she has is too large and therefore really does not compress which is why she cannot wear it at this point. She has no one who can help her put it on regular basis her son can sometimes but he's not able to do it most of the time. I do believe that's why she has begun to weave and have issues as she is currently yet again. No fevers, chills, nausea, or vomiting noted at this time. Patient is no evidence of dementia. 08/12/17 on evaluation today patient appears to still be doing fairly well in regard to the draining areas/weeping areas at this point. With that being  said she unfortunately did go to the ER yesterday due to what was felt to be possibly a cellulitis. They place her on doxycycline by mouth and discharge her home. She definitely was not admitted. With that being said she states she has had more discomfort which has been unusual for her even compared to prior times and she's had infections.08/12/17 on evaluation today patient appears to still be doing fairly well in regard to the draining areas/weeping areas at this point. With that being said she unfortunately did go to the ER yesterday due to what was felt to be possibly a cellulitis. They place her on doxycycline by mouth and discharge her home. She definitely was not admitted. With that being said she states she has had more discomfort which has been unusual for her even compared to prior times and she's had infections. 08/19/17 put evaluation today patient tells me that she's been having a lot of what sounds to be neuropathic type pain in regard to her left lower extremity. She has been using over-the-counter topical bins again which some believe. That in order to apply the she actually remove the wrap we put on her last Wednesday on Thursday. Subsequently she has not had anything on compression wise since that time. The good news is a lot of the weeping areas appear to have closed at this point again I believe she would do better with compression but we are struggling to get her to actually use what she needs to at this point. No fevers, chills, nausea, or vomiting noted at this time. 09/03/17 on evaluation today patient appears to be doing okay in regard to her lower extremities in regard to the lymphedema and weeping. Fortunately she does not seem to show any signs of infection at this point she does have a little bit of weeping occurring in the right medial malleolus area. With that being said this does not appear to be too significant which is good news. 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 She has severe skin damage secondary to both of these features involving the dorsal left foot and medial left ankle and lower leg. Still has open areas in the left anterior foot. The area on the right closed over. She uses her own juxta light stockings. She does not have an arterial issue 09/16/17 on evaluation today patient actually appears to be doing excellent in regard to her bilateral lower extremity swelling. The Juxta-Lite compression wrap seem to be doing very well for her. She has not however been using the portion that goes over her foot.  Her left foot still is draining a little bit not nearly as significant as it has been in the past but still I do believe that she likely needs to utilize the full wrap including the foot portion of this will improve as well. She also has been apparently putting on a significant amount of Vaseline which also think is not helpful for her. I recommended that if she feels she needs something for moisturizer Eucerin will probably be better. 09/30/17 on evaluation today patient presents with several new open areas in regard to her left lower extremity although these appear to be minimal and mainly seem to be more moisture breakdown than anything. Fortunately she does not seem to have any evidence of infection which is great news. She has been tolerating the dressing changes without complication we are using silver alginate on the foot she has been using AB pads to have the legs and using her Juxta- Lite compression which seems to be controlling her swelling very well. Overall I'm pleased with the poor way she has progressed. 10/14/17 on evaluation today patient appears to be doing better in regard to her left lower extremity areas of weeping. She does still have some discomfort although in general this does not appear to be as macerated and I think it is progressing nicely. I do think she still  needs to wear the foot portion of her Juxta- Lite in order to get the most benefit from the wrap obviously. She states she understands. Fortunately there does not appear to be evidence of infection at this time which is great news. 10/28/17 on evaluation today patient appears to be doing excellent in regard to her left lower extremity. She has just a couple areas that are still open and seem to be causing any trouble whatsoever. For that reason I think that she is definitely headed in the right direction the spots are very tiny compared to what we have been dealing with in the past. 11/11/17 on evaluation today patient appears to have a right lateral lower extremity ulcer that has opened since I last saw her. She states this is where the home health nurse that was coming out remove the dressing without wetting the alginate first. Nonetheless I do not know if this is indeed the case or not but more importantly we have not ordered home help to be coming out for her wounds at all. I'm unsure as to why they are coming out and we're gonna have to check on this and get things situated in that regard. With that being said we currently really do not need them to be coming out as the patient has been taking care of her leg herself without complication and no issues. In fact she was doing much better prior to nursing coming out. 11/25/17 on evaluation today patient actually appears to be doing fairly well in regard to her left lower extremity swelling. In fact she has very little area of weeping at this point there's just a small spot on the lateral portion of her right leg that still has me just a little bit more concerned as far as wanting to see this clear up before I discharge her to caring for this at home. Nonetheless overall she has made excellent progress. 12/09/17 on evaluation today patient appears to be doing rather well in regard to her lower extremity edema. She does have some weeping still in the left  lower extremity although the big area we were taking care of two  weeks ago actually has closed and she has another area of weeping on the left lower extremity immediately as well is the top of her foot. She does not currently have lymphedema pumps she has been wearing her compression daily on a regular basis as directed. With that being said I think she may benefit from lymphedema pumps. 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 We had a very difficult time getting and keeping this under control. 12/23/17 on evaluation today patient actually appears to be doing a little bit more poorly in regard to her bilateral lower extremities. She has been tolerating the Juxta-Lite compression wraps. Unfortunately she has two new ulcers on the right lower extremity and left lower Trinity ulceration seems to be larger. Obviously this is not good news. She has been tolerating the dressings without complication. 12/30/17 on evaluation today patient actually appears to be doing much better in regard to her bilateral lower extremity edema. She continues to have some issues with ulcerations and in fact there appears to be one spot on each leg where the wrap may have caused a little bit of a blister which is subsequently opened up at this point is given her pain. Fortunately it does not appear to be any evidence of infection which is good news. No fevers chills noted. 01/13/18 on evaluation today patient appears to be doing rather well in regard to her bilateral lower extremities. The dressings did get kind of stuck as far as the wound beds are concerned but again I think this is mainly due to the fact that she actually seems to be showing signs of healing which is good news. She's not having as much drainage therefore she was having more of the dressing sticking.  Nonetheless overall I feel like her swelling is dramatically down compared to previous. 01/20/18 on evaluation today patient unfortunately though she's doing better in most regards has a large blister on the left anterior lower extremity where she is draining quite significantly. Subsequently this is going to need debridement today in order to see what's underneath and ensure she does not continue to trapping fluid at this location. Nonetheless No fevers, chills, nausea, or vomiting noted at this time. 01/27/18 on evaluation today patient appears to be doing rather well at this point in regard to her right lower extremity there's just a very small area that she still has open at this point. With that being said I do believe that she is tolerating the compression wraps very well in making good progress. Home health is coming out at this point to see her. Her left lower extremity on the lateral portion is actually what still mainly open and causing her some discomfort for the most part 02/10/18 on evaluation today patient actually appears to be doing very well in regard to her right lower extremity were all the ulcers appear to be completely close. In regard to the left lower extremity she does have two areas still open and some leaking from the dorsal surface of her foot but this still seems to be doing much better to me in general. 02/24/18 on evaluation today patient actually appears to be doing much better in regard to her right lower extremity this is still completely healed. Her left lower extremity is also doing much better fortunately she has no evidence of infection. The one  area that is gonna require some debridement is still on the left anterior shin. Fortunately this is not hurting her as badly today. 03/10/18 on evaluation today patient appears to be doing better in some regards although she has a little bit more open area on the dorsal foot and she also has some issues on the medial portion of  the left lower extremity which is actually new and somewhat deep. With that being said there fortunately does not appear to be any significant signs of infection which is good news. No fevers, chills, nausea, or vomiting noted at this time. In general her swelling seems to be doing fairly well which is good news. 03/31/18 on evaluation today patient presents for follow-up concerning her left lower extremity lymphedema. Unfortunately she has been doing a little bit more poorly since I last saw her in regard to the amount of weeping that she is experiencing. She's also having some increased pain in the anterior shin location. Unfortunately I do not feel like the patient is making such good progress at this point a few weeks back she was definitely doing much better. 04/07/18 on evaluation today patient actually appears to be showing some signs of improvement as far as the left lower extremity is concerned. She has been tolerating the dressing changes and it does appear that the Drawtex did better for her. With that being said unfortunately home health is stating that they cannot obtain the Drawtex going forward. Nonetheless we're gonna have to check and see what they may be able to get the alginate they were using was getting stuck in causing new areas of skin being pulled all that with and subsequently weep and calls her to worsen overall this is the first time we've seen improvement at this time. 04/14/18 on evaluation today patient actually appears to be doing rather well at this point there does not appear to be any evidence of infection at this time and she is actually doing excellent in regard to the weeping in fact she almost has no openings remaining even compared to just last week this is a dramatic improvement. No fevers chills noted 04/21/18 evaluation today patient actually appears to be doing very well. She in fact is has a small area on the posterior lower extremity location and she has  a small area on the dorsal surface of her foot that are still open both of which are very close to closing. We're hoping this will be close shortly. She brought her Juxta-Lite wrap with her today hoping that would be able to put her in it unfortunately I don't think were quite at that point yet but we're getting closer. 04/28/18 upon evaluation today patient actually appears to be doing excellent in regard to her left lower extremity ulcer. In fact the region on the posterior lower extremity actually is much smaller than previously noted. Overall I'm very happy with the progress she has made. She again did bring her Juxta-Lite although we're not quite ready for that yet. 05/11/18 upon evaluation today patient actually appears to be doing in general fairly well in regard to her left lower Trinity. The swelling is very well controlled. With that being said she has a new area on the left anterior lower extremity as well as between the first and second toes of her left foot that was not present during the last evaluation. The region of her posterior left lower extremity actually appears to be almost completely healed. T be honest I'm very pleased  with o the way that stands. Nonetheless I do believe that the lotion may be keeping the area to moist as far as her legs are concerned subsequently I'm gonna consider discontinuing that today. 05/26/18 on evaluation today patient appears to be doing rather well in regard to her left lower should be ulcers. In fact everything appears to be close except for a very small area on the left posterior lower extremity. Fortunately there does not appear to be any evidence of infection at this time. Overall very pleased with her progress. 06/02/18 and evaluation today patient actually appears to be doing very well in regard to her lower extremity ulcers. She has one small area that still continues to weep that I think may benefit her being able to justify lotion and user  Juxta-Lite wraps versus continued to wrap her. Nonetheless I think this is something we can definitely look into at this point. 06/23/18 on evaluation today patient unfortunately has openings of her bilateral lower extremities. In general she seems to be doing much worse than when I last saw her just as far as her overall health standpoint is concerned. She states that her discomfort is mainly due to neuropathy she's not having any other issues otherwise. No fevers, chills, nausea, or vomiting noted at this time. 06/30/18 on evaluation today patient actually appears to be doing a little worse in regard to her right lower extremity her left lower extremity of doing fairly well. Fortunately there is no sign of infection at this time. She has been tolerating the dressing changes without complication. Home health did not come out like they were supposed to for the appropriate wrap changes. They stated that they never received the orders from [place] which were fax. Nonetheless we will send a copy of the orders with the patient today as well. 07/07/18 on evaluation today patient appears to be doing much better in regard to lower extremities. She still has several openings bilaterally although since I last saw her her legs did show obvious signs of infection when she later saw her nurse. Subsequently a culture was obtained and she is been placed on Bactrim and Keflex. Fortunately things seem to be looking much better it does appear she likely had an infection. Again last week we'd even discussed it but again there really was not any obvious sign that she had infection therefore we held off on the antibiotics. Nonetheless I'm glad she's doing better today. 07/14/18 on evaluation today patient appears to be doing much better regarding her bilateral lower Trinity's. In fact on the right lower for me there's nothing open at this point there are some dry skin areas at the sites where she had infection. Fortunately there is  no evidence of systemic infection which is excellent news. No fevers chills noted 07/21/18 on evaluation today patient actually appears to be doing much better in regard to her left lower extremity ulcers. She is making good progress and overall I feel like she's improving each time I see her. She's having no pain I do feel like the infection is completely resolved which is excellent news. No fevers, chills, nausea, or vomiting noted at this time. 07/28/18 on evaluation today patient appears to be doing very well in regard to her left lower [employer]. Everything seems to be showing signs of improvement which is excellent news. Overall very pleased with the progress that has been made. Fortunately there's no evidence of active infection at this time also excellent news. 08/04/18 on evaluation  today patient appears to be doing more poorly in regard to her bilateral lower extremities. She has two new areas open up on the right and these were completely closed as of last week. She still has the two spots on the left which in my pinion seem to be doing better. Fortunately there's no evidence of infection again at this point. 08/11/18 on evaluation today patient actually appears to be doing very well in regard to her bilateral lower Trinity wounds that all seem to be doing better and are measures smaller today. Fortunately there's no signs of infection. No fevers, chills, nausea, or vomiting noted at this time. 08/18/18 on evaluation today patient actually appears to be doing about the same inverter bilateral lower extremities. She continues to have areas that blistering open as was drain that fortunately nothing too significant. Overall I feel like Drawtex may have done better for her however compared to the collagen. 08/25/18 on evaluation today patient appears to be doing a little bit more poorly today even compared to last time I saw her. Again I'm not exactly sure why she's making worse progress over  the past several weeks. I'm beginning to wonder if there is some kind of underlying low level infection causing this issue. I did actually take a culture from the left anterior lower extremity but it was a new wound draining quite a bit at this point. Unfortunately she also seems to be having more pain which is what also makes me worried about the possibility of infection. This is despite never erythema noted at this point. 09/01/18 on evaluation today patient actually appears to be doing a little worse even compared to last week in regard to bilateral lower extremities. She did go to the hospital on the 16th was given a dose of IV Zosyn and then discharged with a recommendation to continue with the Bactrim that I previously prescribed for her. Nonetheless she is still having a lot of discomfort she tells me as well at this time. This is definitely unfortunate. No fevers, chills, nausea, or vomiting noted at this time. 09/08/18 on evaluation today patient's bilateral lower extremities actually appear to be shown signs of improvement which is good news. Fortunately there does not appear to be any signs of active infection I think the anabiotic is helping in this regard. Overall I'm very pleased with how she is progressing. 09/15/18 patient was actually seen in ER yesterday due to her legs as well unfortunately. She states she's been having a lot of pain and discomfort as well as a lot of drainage. Upon inspection today the patient does have a lot of swelling and drainage I feel like this is more related to lymphedema and poor fluid control than it is to infection based on what I'm seeing. The physician in the emergency department also doubted that the patient was having a significant infection nonetheless I see no evidence of infection obvious at this point although I do see evidence of poor fluid control. She still not using a compression pumps, she is not elevating due to her lift chair as well as her  hospital bed being broken, and she really is not keeping her legs up as much as they should be and also has been taking off her wraps. All this combined I think has led to poor fluid control and to be honest she may be somewhat volume overloaded in general as well. I recommend that she may need to contact your physician to see if a  prescription for a diuretic would be beneficial in their opinion. As long as this is safe I think it would likely help her. 09/29/18 on evaluation today patient's left lower extremity actually appears to be doing quite a bit better. At least compared to last time that I saw her. She still has a large area where she is draining from but there's a lot of new skin speckled trout and in fact there's more new skin that there are open areas of weeping and drainage at this point. This is good news. With regard to the right lower extremity this is doing much better with the only open area that I really see being a dry spot on the right lateral ankle currently. Fortunately there's no signs of active infection at this time which is good news. No fevers, chills, nausea, or vomiting noted at this time. The patient seems somewhat stressed and overwhelmed during the visit today she was very lethargic as such. She does and she is not taking any pain medications at this point. Apparently according to her husband are also in the process of moving which is probably taking its toll on her as well. 10/06/18 on evaluation today patient appears to be doing rather well in regard to her lower extremities compared to last evaluation. Fortunately there's no signs of active infection. She tells me she did have an appointment with her primary. Nonetheless he was concerned that the wounds were somewhat deep based on pictures but we never actually saw her legs. She states that he had her somewhat worried due to the fact that she was fearing now that she was [place] have to have an amputation. With that being  said based on what I'm seeing check she looks better this week that she has the last two times I've seen her with much less drainage I'm actually pleased in this regard. That doesn't mean that she's out of the water but again I do not think what the point of talking about education at all in regard to her leg. She is very happy to hear this. She is also not having as much pain as she was having last week. 10/13/18 unfortunately on evaluation today patient still continues to have a significant amount of drainage she's not letting home health actually apply the compression dressings at this point. She's trying to use of Juxta-Lite of the top of Kerlex and the second layer of the three layer compression wrap. With that being said she just does not seem to be making as good a progress as I would expect if she was having the compression applied and in place on a regular basis. No fevers, chills, nausea, or vomiting noted at this time. 10/20/18 on evaluation today patient appears to be doing a little better in regard to her bilateral lower extremity ulcers. In fact the right lower extremity seems to be healed she doesn't even have any openings at this point left lower extremity though still somewhat macerated seems to be showing signs of new skin growth at multiple locations throughout. Fortunately there's no evidence of active infection at this time. No fevers, chills, nausea, or vomiting noted at this time. 10/27/18 on evaluation today patient appears to be doing much better in regard to her left lower Trinity ulcer. She's been tolerating the laptop complication and has minimal drainage noted at this point. Fortunately there's no signs of active infection at this time. No fevers, chills, nausea, or vomiting noted at this time. 11/03/18 on evaluation today  patient actually appears to be doing excellent in regard to her left lower extremity. She is having very little drainage at this point there does not appear  to be any significant signs of infection overall very pleased with how things have gone. She is likewise extremely pleased still and seems to be making wonderful progress week to week. I do believe antibiotics were helpful for her. Her primary care provider did place on amateur clean since I last saw her. 11/17/18 on evaluation today patient appears to be doing worse in regard to her bilateral lower extremities at this point. She is been tolerating the dressing changes without complication. With that being said she typically takes the Coban off fairly quickly upon arriving home even after being seen here in the clinic and does not allow home health reapply command as part of the dressing at home. Therefore she said no compression essentially since I last saw her as best I can tell. With that being said I think it shows and how much swelling she has in the open wounds that are noted at this point. Fortunately there's no signs of infection but unfortunately if she doesn't get this under control I think she will end up with infection and more significant issues. 11/24/18 on evaluation today patient actually appears to be doing somewhat better in regard to her bilateral lower extremities. 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 Nonetheless that is doing better currently so she should be able to attempt reinitiating the lymphedema pumps at this time. No fevers, chills, nausea, or vomiting noted at this time. 12/01/18 upon evaluation today patient's left lower extremity appears to be doing quite well unfortunately her right lower extremity is not doing nearly as well. She has been tolerating the dressing changes without complication unfortunately she did not keep a wrap on the right at this time. Nonetheless I believe this has led to increased swelling and weeping in  the world is actually much larger than during the last evaluation with her. 12/08/18 on evaluation today patient appears to be doing about the same at this point in regard to her right lower extremity. There is some more palatable to touch I'm concerned about the possibility of there being some infection although I think the main issue is she's not keeping her compression wrap on which in turn is not allowing this area to heal appropriately. 12/22/18 on evaluation today patient appears to be doing better in regard to left lower extremity unfortunately significantly worse in regard to the right lower extremity. The areas of blistering and necrotic superficial tissue have spread and again this does not really appear to be signs of infection and all she just doesn't seem to be doing nearly as well is what she has been in the past. Overall I feel like the Augmentin did absolutely nothing for her she doesn't seem to have any infection again I really didn't think so last time either is more of a potential preventative measure and hoping that this would make some difference but I think the main issue is she's not wearing her compression. She tells me she cannot wear the [place] been we put on she takes it off pretty much upon getting home. Subsequently she worshiped Juxta-Lite when I questioned her about how often she wears it this is no  more than three hours a day obviously that leaves 21 hours that she has no compression and this is obviously not doing well for her. Overall I'm concerned that if things continue to worsen she is at great risk of both infection as well as losing her leg. 01/05/19 on evaluation today patient appears to be doing well in regard to her left lower extremity which he is allowing [place] to wrap and not so well with regard to her right lower extremity which she is not allowing [place] to really wrap and keep the wrap on. She states that it hurts too badly whenever it's wrapped and she ends up  having to take it off. She's been using the Juxta-Lite she tells me up to six hours a day although I question whether or not that's really been the case to be honest. Previously she told me three hours today nonetheless obviously the legs as long as the wrap is doing great when she is not is doing much more poorly. 01/12/2019 on evaluation today patient actually appears to be doing a little better in my opinion with regard to her right lower extremity ulcer. She has a small open area on the left lower extremity unfortunately but again this I think is part of the normal fluctuation of what she is going to have to expect with regard to her legs especially when she is not using her lymphedema pumps on a regular basis. Subsequently based on what I am seeing today I think that she does seem to be doing slightly better with regard to her right lower extremity she did see her primary care provider on Monday they felt she had an infection and placed her on 2 antibiotics. Both Cipro and clindamycin. Subsequently again she seems possibly to be doing a little bit better in regards to the right lower extremity she also tells me however she has been wearing the compression wrap over the past week since I spoke with her as well that is a Kerlix and Coban wrap on the right. No fevers, chills, nausea, vomiting, or diarrhea. 01/19/2019 on evaluation today patient appears to be doing better with regard to her bilateral lower extremities especially the right. I feel like the compression has been beneficial for her which is great news. She did get a call from her primary care provider on her way here today telling her that she did have methicillin- resistant Staphylococcus aureus and he was calling in a couple new antibiotics for her including a ointment to be applied she tells me 3 times a day. With that being said this sounds like likely to be Bactroban which I think could be applied with each dressing/wrap change but I  would not be able to accommodate her applying this 3 times a day. She is in agreement with the least doing this we will add that to her orders today. 01/26/2019 on evaluation today patient actually appears to be doing much better with regard to her right lower extremity. Her left lower extremity is also doing quite well all things considering. Fortunately there is no evidence of active infection at this time. No fevers, chills, nausea, vomiting, or diarrhea. 02/02/2019 on evaluation today patient appears to be doing much better compared to her last evaluation. Little by little off like her right leg is returning more towards normal. There does not appear to be any signs of active infection and overall she seems to be doing quite well which is great news. I am very pleased  in this regard. No fevers, chills, nausea, vomiting, or diarrhea. 02/09/2019 upon evaluation today patient appears to be doing better with regard to her bilateral lower extremities. She has been tolerating the dressing changes without complication. Fortunately there is no signs of active infection at this time. No fevers, chills, nausea, vomiting, or diarrhea. 02/23/2019 on evaluation today patient actually appears to be doing quite well with regard to her bilateral lower extremities. She has been tolerating the dressing changes without complication. She is even used her pumps one time and states that she really felt like it felt good. With that being said she seems to be in good spirits and her legs appear to be doing excellent. 03/09/2019 on evaluation today patient appears to be doing well with regard to her right lower extremity there are no open wounds at this time she is having some discomfort but I feel like this is more neuropathy than anything. With regard to her left lower extremity she had several areas scattered around that she does have some weeping and drainage from but again overall she does not appear to be having any  significant issues and no evidence of infection at this time which is good news. 03/23/2019 on evaluation today patient appears to be doing well with regard to her right lower extremity which she tells me is still close she is using her juxta light here. Her left lower extremity she mainly just has an area on the foot which is still slightly draining although this also is doing great. Overall very pleased at this time. 04/06/2019 patient appears to be doing a little bit worse in regard to her left lower extremity upon evaluation today. She feels like this could be becoming infected again which she had issues with previous. Fortunately there is no signs of systemic infection but again this is always a struggle with her with her legs she will go from doing well to not so well in a very short amount of time. 04/20/2019 on evaluation today patient actually appears to be doing quite well with regard to her right lower extremity I do not see any signs of active infection at this time. Fortunately there is no fever chills noted. She is still taking the antibiotics which I prescribed for her at this point. In regard to the left lower extremity I do feel like some of these areas are better although again she still is having weeping from several locations at this time. 04/27/2019 on evaluation today patient appears to be doing about the same if not slightly worse in regard to her left lower extremity ulcers. She tells me when questioned that she has been sleeping in her Hoveround chair in fact she tells me she falls asleep without even knowing it. I think she is spending a whole lot of time in the chair and less time walking and moving around which is not good for her legs either. On top of that she is in a seated position which is also the worst position she is not really elevating her legs and she is also not using her lymphedema pumps. All this is good to contribute to worsening of her condition in  general. 05/18/2019 on evaluation today patient appears to be doing well with regard to her lower extremity on the right in fact this is showing no signs of any open wounds at this time. On the left she is continuing to have issues with areas that do drain. Some of the regions have healed and  there are couple areas that have reopened. She did go to the ER per the patient according to recommendations from the home health nurse due to what she was seen when she came out on 05/13/2019. Subsequently she felt like the patient needed to go to the hospital due to the fact that again she was having "milky white discharge" from her leg. Nonetheless she had and then was placed on doxycycline and subsequently seems to be doing better. 06/01/2019 upon evaluation today patient appears to be doing really in my opinion about the same. I do not see any signs of active infection which is good news. Overall she still has wounds over the bilateral lower extremities she has reopened on the right but this appears to be more of a crack where there is weeping/edema coming from the region. I do not see any evidence of infection at either site based on what I visualized today. 07/13/2019 upon evaluation today patient appears to be doing a little worse compared to last time I saw her. She since has been in the hospital from 06/21/2019 through 06/29/2019. This was secondary to having Covid. During that time they did apply lotion to her legs which unfortunately has caused her to develop a myriad of open wounds on her lower extremities. Her legs do appear to be doing better as far as the overall appearance is concerned but nonetheless she does have more open and weeping areas. 07/27/2019 upon evaluation today patient appears to be doing more poorly to be honest in regard to her left lower extremity in particular. There is no signs of systemic infection although I do believe she may have local infection. She notes she has been having  a lot of blue/green drainage which is consistent potentially with Pseudomonas. That may be something that we need to consider here as well. The doxycycline does not seem to have been helping. 08/03/2019 upon evaluation today patient appears to be doing a little better in my opinion compared to last week's evaluation. Her culture I did review today and she is on appropriate medications to help treat the Enterobacter that was noted. Overall I feel like that is good news. With that being said she is unfortunately continuing to have a lot of drainage and though it is doing better I still think she has a long ways to go to get things dried up in general. Fortunately there is no signs of systemic infection. 08/10/2019 upon evaluation today patient appears to be doing may be slightly better in regard to her left lower extremity the right lower extremity is doing much better. Fortunately there is no signs of infection right now which is good news. No fevers, chills, nausea, vomiting, or diarrhea. 08/24/2019 on evaluation today patient appears to be doing slightly better in regard to her lower extremities. The left lower extremity seems to be healed the right lower extremity is doing better though not completely healed as far as the openings are concerned. She has some generalized issues here with edema and weeping secondary to her lymphedema though again I do believe this is little bit drier compared to prior weeks evaluations. In general I am very pleased with how things seem to be progressing. No fevers, chills, nausea, vomiting, or diarrhea. 08/31/2019 upon evaluation today patient actually seems to making some progress here with regard to the left lower extremity in particular. She has been tolerating the dressing changes without complication. Fortunately there is no signs of active infection at this time. No fevers,  chills, nausea, vomiting, or diarrhea. She did see Dr. Doren Custard and he did note that she did have  a issue with the left great saphenous vein and the small saphenous vein in the leg. With that being said he was concerned about the possibility of laser ablation not being extremely successful. He also mentioned a small risk of DVT associated with the procedure. However if the wounds do not continue to improve he stated that that would probably be the way to go. Fortunately the patient's legs do seem to be doing much better. 09/07/2019 upon evaluation today patient appears to be doing better with regard to her lower extremities. She has been tolerating the dressing changes without complication. With that being said she is showing signs of improvement and overall very pleased. There are some areas on her leg that I think we do need to debride we discussed this last week the patient is in agreement with doing that as long as it does not hurt too badly. 09/14/2019 upon evaluation today patient appears to be doing decently well with regard to her left lower extremity. She is not having near as much weeping as she has had in the past things seem to be drying up which is good news. There is no signs of active infection at this time. 09/21/19 upon evaluation today patient appears to be doing better in regard overall to her bilateral lower extremities. She again has less open than she did previous and each week I feel like this is getting better. Fortunately there is no signs of active infection at this time. No fevers, chills, nausea, vomiting, or diarrhea. 09/28/2019 upon evaluation today patient actually appears to be showing signs of improvement with regard to her left lower extremity. Unfortunately the right medial lower extremity around the ankle region has reopened to some degree but this appears to be minimal still which is good news. There is no signs of active infection at this time which is also good news. 10/12/2019 upon evaluation today patient appears to be doing okay with regard to her bilateral  lower extremities today. The right is a little bit worse then last evaluation 2 weeks ago. The left is actually doing a little better in my opinion. Overall there is no signs of active infection at this time that I see. Obviously that something we have to keep a close eye on she is very prone to this with the significant and multiple openings that she has over the bilateral lower extremities. 10/19/2019 upon evaluation today patient appears to be doing about the best that I have seen her in quite some time. She has been tolerating the dressing changes without complication. There does not appear to be any signs of active infection and overall I am extremely happy with the way her legs appeared. She is drying up quite nicely and overall is having less pain. 11/09/2019 upon evaluation today patient appears to be doing better in regard to her wounds. She seems to be drying up more and more each time I see her this is just taking a very long time. Fortunately there is no signs of active infection at this time. 11/23/2019 upon evaluation today patient actually appears to be doing excellent in regard to her lower extremities at this point compared to where she has been. Fortunately there is no signs of active infection at this time. She did go to the hospital last week for nausea and vomiting completely unrelated to her wounds. Fortunately she is doing better  she was given some Reglan and got better. She had associated abdominal pain but they never found out what was going on. 12/07/2019 upon evaluation today patient appears to be doing well for the most part in regard to her legs. She unfortunately has not been keeping the Coban portion of her wraps on therefore the compression has not really been sufficient for what it is supposed to be. Nonetheless she tells me that it just hurt too bad therefore she removed it. 12/21/2019 upon evaluation today patient actually appears to be doing quite well with regard to her  legs. I do feel like she has been making progress which is great news and overall there is no signs of active infection at this time. No fevers, chills, nausea, vomiting, or diarrhea. 01/04/2020 upon evaluation today patient presents for follow-up concerning her lower extremity edema bilaterally. She still has open wounds she has not been using her lymphedema pumps. She is also not been utilizing her compression wraps appropriately she tends to unwrap them, take them off, or states that they hurt. Obviously the reason they hurt is because her legs start to swell but the issue is if she would use her compression/lymphedema pumps regularly she would not swell and she would have the pain. Nonetheless she has not even picked them up once honestly over the past several months and may be even as much as in the past year based on my opinion and what have seen. She tells me today that after last week when I talked about this with her specifically actually that was 2 weeks ago that she "forgot". 8//21 on evaluation today patient appears to be doing a little better in regard to her legs bilaterally. Fortunately there is no signs of active infection at this time. She tells me that she used her lymphedema pumps all of one time over the past 2 weeks since I last saw her. She tells me that she has been too busy in order to continue to use these. 02/01/2020 on evaluation today patient appears to be doing some better in regard to her wounds in general in her legs. We felt the right was healed although is not completely it does appear to be doing better she tells me she has been using her lymphedema pumps that she has had this six times since I last saw her. Obviously the more she does that the better she would do my opinion 02/15/2020 upon evaluation today patient appears to be doing about the same in regard to her legs. She tells me that she is pumping I'm still not sure how much she does to be perfectly honest. However  even if she does a little bit here and there I guess that is better than nothing. Fortunately there is no sign of active infection at this time which is great news. No fevers, chills, nausea, vomiting, or diarrhea. Electronic Signature(s) Signed: 02/15/2020 1:48:57 PM By: [name] PA-C Entered By: [name] on 02/15/2020 13:48:57 -------------------------------------------------------------------------------- Physical Exam Details Patient Name: Date of Service: LEATHA, ROHNER 02/15/2020 10:45 A M Medical Record Number: [id number] Patient Account Number: [id number] Date of Birth/Sex: Treating RN: 24-Jan-1951 (69 y.o. [name] Primary Care Provider: [name] Other Clinician: Referring Provider: Treating Provider/Extender: Doyle Askew, FRED Weeks in Treatment: 23 Constitutional Well-nourished and well-hydrated in no acute distress. Respiratory normal breathing without difficulty. Psychiatric this patient is able to make decisions and demonstrates good insight into disease process. Alert and Oriented x  3. pleasant and cooperative. Notes Patient's wound bed actually showed signs of good granulation at this time there does not appear to be any evidence of active infection and overall very pleased with where things stand. No fevers, chills, nausea, vomiting, or diarrhea. Electronic Signature(s) Signed: 02/15/2020 1:49:19 PM By: [name] PA-C Entered By: [name] on 02/15/2020 13:49:18 -------------------------------------------------------------------------------- Physician Orders Details Patient Name: Date of Service: [name]. 02/15/2020 10:45 A M Medical Record Number: [id number] Patient Account Number: [id number] Date of Birth/Sex: Treating RN: 1950/08/08 (69 y.o. [name] Primary Care Provider: [name] Other Clinician: Referring Provider: Treating Provider/Extender: Doyle Askew, FRED Weeks in Treatment:  [phone] Verbal / Phone Orders: No Diagnosis Coding ICD-10 Coding Code Description E11.622 Type 2 diabetes mellitus with other skin ulcer I89.0 Lymphedema, not elsewhere classified I87.331 Chronic venous hypertension (idiopathic) with ulcer and inflammation of right lower extremity I87.332 Chronic venous hypertension (idiopathic) with ulcer and inflammation of left lower extremity L97.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 leg with fat layer exposed L97.822 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 Dressing Change Frequency Wound #61 Left,Circumferential Lower Leg Change dressing three times week. Wound #64 Left,Dorsal Foot Change dressing three times week. Wound #65 Right,Medial Lower Leg Change dressing three times week. Skin Barriers/Peri-Wound Care Moisturizing lotion - to dry skin Other: - discontinue barrier cream (zinc paste) Wound Cleansing Clean wound with Wound Cleanser - wash legs with soap and water with dressing changes May shower with protection. Primary Wound Dressing Wound #61 Left,Circumferential Lower Leg Calcium Alginate with Silver Wound #64 Left,Dorsal Foot Calcium Alginate with Silver Wound #65 Right,Medial Lower Leg Calcium Alginate with Silver Secondary Dressing Wound #61 Left,Circumferential Lower Leg Dry Gauze ABD pad - as needed Wound #64 Left,Dorsal Foot Dry Gauze ABD pad - as needed Wound #65 Right,Medial Lower Leg Dry Gauze Edema Control 3 Layer Compression System - Bilateral void standing for long periods of time - walking is encouraged A Elevate legs to the level of the heart or above for 30 minutes  daily and/or when sitting, a frequency of: - do not sleep in chair with feet dangling, MUST elevate legs while sitting Exercise regularly Segmental Compressive Device. - lymphedema pumps 60 minutes 1- 2 times per day Off-Loading Turn and reposition every 2 hours Additional Orders / Instructions Follow Nutritious Diet - T include vitamin A, vitamin C, and Zinc along with increased protein intake. o [place] [facility] for wound care. - [facility] Electronic Signature(s) Signed: 02/15/2020 6:44:49 PM By: [name] RN, BSN Signed: 02/18/2020 11:53:24 AM By: [name] PA-C Entered By: [name] on 02/15/2020 12:02:00 -------------------------------------------------------------------------------- Problem List Details Patient Name: Date of Service: [name]. 02/15/2020 10:45 A M Medical Record Number: [id number] Patient Account Number: [id number] Date of Birth/Sex: Treating RN: April 29, 1951 (69 y.o. [name] Primary Care Provider: [name] Other Clinician: Referring Provider: Treating Provider/Extender: Doyle Askew, FRED Weeks in Treatment: [phone] Active Problems ICD-10 Encounter Code Description Active Date MDM Diagnosis E11.622 Type 2 diabetes mellitus with other skin ulcer 08/05/2017 No Yes I89.0 Lymphedema, not elsewhere classified 08/05/2017 No Yes I87.331 Chronic venous hypertension (idiopathic) with ulcer and  inflammation of right 08/05/2017 No Yes lower extremity I87.332 Chronic venous hypertension (idiopathic) with ulcer and inflammation of left 08/05/2017 No Yes lower extremity L97.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 leg with fat layer 08/05/2017 No Yes exposed L97.822 Non-pressure chronic ulcer of other part of left lower leg with fat layer exposed2/20/2019 No Yes L97.522 Non-pressure chronic ulcer of other part of left foot with fat layer exposed 06/01/2019 No Yes I10 Essential  (primary) hypertension 08/05/2017 No Yes E66.01 Morbid (severe) obesity due to excess calories 08/05/2017 No Yes F41.8 Other specified anxiety disorders 08/05/2017 No Yes R53.1 Weakness 08/05/2017 No Yes Inactive Problems Resolved Problems Electronic Signature(s) Signed: 02/15/2020 11:52:51 AM By: [name] PA-C Entered By: [name] on 02/15/2020 11:52:51 -------------------------------------------------------------------------------- Progress Note Details Patient Name: Date of Service: [name]. 02/15/2020 10:45 A M Medical Record Number: [id number] Patient Account Number: [id number] Date of Birth/Sex: Treating RN: 10/04/1950 (69 y.o. [name] Primary Care Provider: [name] Other Clinician: Referring Provider: Treating Provider/Extender: Doyle Askew, FRED Weeks in Treatment: [phone] Subjective Chief Complaint Information obtained from Patient Bilateral reoccurring LE ulcers History of Present Illness (HPI) this patient has been seen a couple of times before and returns with recurrent problems to her right and left lower extremity with swelling and weeping ulcerations due to not wearing her compression stockings which she had been advised to do during her last discharge, at the end of June 2018. During her last visit the patient had had normal arterial blood flow and her venous reflux study did not necessitate any surgical intervention. She was recommended compression and elevation and wound care. After prolonged treatment the patient was completely healed but she has been noncompliant with wearing or compressions.. She was here last week with an outpatient return visit planned but the patient came in a very poor general condition with altered mental status and was rushed to the ER on my request. With a history of hypertension, diabetes, TIA and right-sided weakness she was set up for an MRI on her brain and cervical spine and was sent to [facility]. Getting an MRI done was very difficult but once the workup was done she was found not to have any spinal stenosis, epidural abscess or hematoma or discitis. This was radiculopathy to be treated as an outpatient and she was given a follow-up appointment. Today she is feeling much better alert and oriented and has come to reevaluate her bilateral lower extremity lymphedema and ulceration 03/25/2017 -- she was admitted to the hospital on 03/16/2017 and discharged on 03/18/2017 with left leg cellulitis and ulceration. She was started on vancomycin and Zosyn and x-ray showed no bony involvement. She was treated for a cellulitis with IV antibiotics changed to Rocephin and Flagyl and was discharged on oral Keflex and doxycycline to complete a 7 day course. Last hemoglobin A1c was 7.1 and her other ailments including hypertension got asthma were appropriately treated. 05/06/2017 -- she is awaiting the right size of compression stockings from [place] but other than that has been doing well. ====== Old notes 69 year old patient was seen one time last October and was lost to follow-up. She has recurrent problems with weeping and ulceration of her left lower extremity and has swelling of this for several years. It has been worse for the last 2 months. Past medical history is significant for diabetes mellitus type 2, hypertension, gout, morbid obesity, depressive disorders, hiatal hernia, migraines, status post knee surgery, risk of  a cholecystectomy, vaginal hysterectomy and breast biopsy. She is not a smoker. As noted before she has never had a venous duplex study and an arterial ABI study was attempted but the left lower extremity was noncompressible 10/01/2016 -- had a lower extremity venous duplex reflux evaluation which showed no evidence of deep vein reflux in the right or left lower extremity, and no evidence of great saphenous vein reflux more than 500 ms in the right or left lower extremity, and  the left small saphenous vein is incompetent but no vascular consult was recommended. review of her electronic medical records noted that the ABI was checked in July 2017 where the right ABI was normal limits and the left ABI could not be ascertained due to pain with cuff pressure but the waveforms are within normal limits. her arterial duplex study scheduled for April 27. 10/08/2016 -- the patient has various reasons for not having a compression on and for the last 3 days she has had no compression on her left lower extremity either due to pain or the lack of nursing help. She does not use her juxta lites either. 10/15/2016 -- the patient did not keep her appointment for arterial duplex study on April 27 and I have asked her to reschedule this. Her pain is out of proportion with the physical findings and she continuously fails to wear a compression wraps and cuts them off because she says she cannot tolerate the pain. She does not use her juxta lites either. 10/22/2016 -- he has rescheduled her arterial duplex study to May 21 and her pain today is a bit better. She has not been wearing her juxta lites on her right lower extremity but now understands that she needs to do this. She did tolerate the to press compression wrap on her left lower extremity 10/29/2016 --arterial duplex study is scheduled for next week and overall she has been tolerating her compression wraps and also using her juxta lites on her right lower extremity 11/05/2016 -- the right ABI was 0.95 the left was 1.03. The digit TBI is on the right was 0.83 on the left was 0.92 and she had biphasic flow through these vessels. The impression was that of normal lower extremity arterial study. 11/12/2016 -- her pain is minimal and she is doing very well overall. 11/26/2016 -- she has got juxta lites and her insurance will not pay for additional dual layer compression stockings. She is going to order some from [place]. 05/12/2017 -- her  juxta lites are very old and too big for her and these have not been helping with compression. She did get 20-30 mm compression stockings from [place] but she and her husband are unable to put these on. I believe she will benefit from bilateral Extremit-ease, compression stockings and we will measure her for these today. 05/20/2017 -- lymphedema on the left lower extremity has increased a lot and she has a open ulceration as a result of this. The right lower extremity is looking pretty good. She has decided to by the compression stockings herself and will get reimbursed by the home health, at a later date. 05/27/2017 -- her sciatica is bothering her a lot and she thought her left leg pain was caused due to the compression wrap and hence removed it and has significant lymphedema. There is no inflammation on this left lower extremity. 06/17/17 on evaluation today patient appears to be doing very well and in fact is completely healed in regard to her ulcerations. Unfortunately however  she does have continued issues with lymphedema nonetheless. We did order compression garments for her unfortunately she states that the size that she received were large although we ordered medium. Obviously this means she is not getting the optimal compression. She does not have those with her today and therefore we could not confirm and contact the company on her behalf. Nonetheless she does state that she is going to have her husband bring them by tomorrow so that we can verify and then get in touch with the company. No fevers, chills, nausea, or vomiting noted at this time. Overall patient is doing better otherwise and I'm pleased with the progress she has made. 07/01/17 on evaluation today patient appears to be doing very well in regard to her bilateral lower extremity she does not have any openings at this point which is excellent news. Overall I'm pleased with how things have progressed up to this time. Since she is  doing so well we did order her compression which we are seeing her today to ensure that it fits her properly and everything is doing well in that regard and then subsequently she will be discharged. ============ Old Notes: 03/31/16 patient presents today for evaluation concerning open wounds that she has over the left medial ankle region as well as the left dorsal foot. She has previously had this occur although it has been healed for a number of months after having this for about a year prior until her hospitalization on 01/05/16. At that point in time it appears that she was admitted to the hospital for left lower extremity cellulitis and was placed on vancomycin and Zosyn at that point. Eventually upon discharge on January 15, 2016 she was placed on doxycycline at that point in time. Later on 03/27/16 positive wound culture growing Escherichia coli this was switched to amoxicillin. Currently she tells me that she is having pain radiated to be a 7 out of 10 which can be as high as 10 out of 10 with palpation and manipulation of the wound. This wound appears to be mainly venous in nature due to the bilateral lower extremity venous stasis/lymphedema. This is definitely much worse on her left than the right side. She does have type 1 diabetes mellitus, hypertension, morbid obesity, and is wheelchair dependent.during the course of the hospital stay a blood culture was also obtained and fortunately appeared negative. She also had an x-ray of the tibia/fibula on the left which showed no acute bone abnormality. Her white blood cell count which was performed last on 03/25/16 was 7.3, hemoglobin 12.8, protein 7.1, albumin 3.0. Her urine culture appeared to be negative for any specific organisms. Patient did have a left lower extremity venous duplex evaluation for DVT . This did not include venous reflux studies but fortunately was negative for DVT Patient also had arterial studies performed which revealed that  she had a . normal ABI on the right though this was unable to be performed on the left secondary to pain that she was having around the ankle region due to the wound. However it was stated on report that she had biphasic pulses and apparently good blood flow. ========== 06/03/17 she is here in follow-up evaluation for right lower extremity ulcer. The right lower sure he has healed but she has reopened to the left medial malleolus and dorsal foot with weeping. She is waiting for new compression garments to arrive from home health, the previous compression garments were ill fitting. We will continue with compression bilaterally  and follow-up in 2 weeks Readmission: 08/05/17 on evaluation today patient appears to be doing somewhat poorly in regard to her left lower extremity especially although the right lower extremity has a small area which may no longer be open. She has been having a lot of drainage from the left lower extremity however he tells me that she has not been able to use the EXTREMIT-EASE Compression at this point. She states that she did better and was able to actually apply the Juxta-Lite compression although the wound that she has is too large and therefore really does not compress which is why she cannot wear it at this point. She has no one who can help her put it on regular basis her son can sometimes but he's not able to do it most of the time. I do believe that's why she has begun to weave and have issues as she is currently yet again. No fevers, chills, nausea, or vomiting noted at this time. Patient is no evidence of dementia. 08/12/17 on evaluation today patient appears to still be doing fairly well in regard to the draining areas/weeping areas at this point. With that being said she unfortunately did go to the ER yesterday due to what was felt to be possibly a cellulitis. They place her on doxycycline by mouth and discharge her home. She definitely was not admitted. With that  being said she states she has had more discomfort which has been unusual for her even compared to prior times and she's had infections.08/12/17 on evaluation today patient appears to still be doing fairly well in regard to the draining areas/weeping areas at this point. With that being said she unfortunately did go to the ER yesterday due to what was felt to be possibly a cellulitis. They place her on doxycycline by mouth and discharge her home. She definitely was not admitted. With that being said she states she has had more discomfort which has been unusual for her even compared to prior times and she's had infections. 08/19/17 put evaluation today patient tells me that she's been having a lot of what sounds to be neuropathic type pain in regard to her left lower extremity. She has been using over-the-counter topical bins again which some believe. That in order to apply the she actually remove the wrap we put on her last Wednesday on Thursday. Subsequently she has not had anything on compression wise since that time. The good news is a lot of the weeping areas appear to have closed at this point again I believe she would do better with compression but we are struggling to get her to actually use what she needs to at this point. No fevers, chills, nausea, or vomiting noted at this time. 09/03/17 on evaluation today patient appears to be doing okay in regard to her lower extremities in regard to the lymphedema and weeping. Fortunately she does not seem to show any signs of infection at this point she does have a little bit of weeping occurring in the right medial malleolus area. With that being said this does not appear to be too significant which is good news. 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 She has severe skin damage secondary to both of these features involving the dorsal left foot and medial left ankle and lower leg. Still  has open areas in the left anterior foot. The area on the right closed over. She uses her own juxta light stockings. She  does not have an arterial issue 09/16/17 on evaluation today patient actually appears to be doing excellent in regard to her bilateral lower extremity swelling. The Juxta-Lite compression wrap seem to be doing very well for her. She has not however been using the portion that goes over her foot. Her left foot still is draining a little bit not nearly as significant as it has been in the past but still I do believe that she likely needs to utilize the full wrap including the foot portion of this will improve as well. She also has been apparently putting on a significant amount of Vaseline which also think is not helpful for her. I recommended that if she feels she needs something for moisturizer Eucerin will probably be better. 09/30/17 on evaluation today patient presents with several new open areas in regard to her left lower extremity although these appear to be minimal and mainly seem to be more moisture breakdown than anything. Fortunately she does not seem to have any evidence of infection which is great news. She has been tolerating the dressing changes without complication we are using silver alginate on the foot she has been using AB pads to have the legs and using her Juxta- Lite compression which seems to be controlling her swelling very well. Overall I'm pleased with the poor way she has progressed. 10/14/17 on evaluation today patient appears to be doing better in regard to her left lower extremity areas of weeping. She does still have some discomfort although in general this does not appear to be as macerated and I think it is progressing nicely. I do think she still needs to wear the foot portion of her Juxta- Lite in order to get the most benefit from the wrap obviously. She states she understands. Fortunately there does not appear to be evidence of infection at this time  which is great news. 10/28/17 on evaluation today patient appears to be doing excellent in regard to her left lower extremity. She has just a couple areas that are still open and seem to be causing any trouble whatsoever. For that reason I think that she is definitely headed in the right direction the spots are very tiny compared to what we have been dealing with in the past. 11/11/17 on evaluation today patient appears to have a right lateral lower extremity ulcer that has opened since I last saw her. She states this is where the home health nurse that was coming out remove the dressing without wetting the alginate first. Nonetheless I do not know if this is indeed the case or not but more importantly we have not ordered home help to be coming out for her wounds at all. I'm unsure as to why they are coming out and we're gonna have to check on this and get things situated in that regard. With that being said we currently really do not need them to be coming out as the patient has been taking care of her leg herself without complication and no issues. In fact she was doing much better prior to nursing coming out. 11/25/17 on evaluation today patient actually appears to be doing fairly well in regard to her left lower extremity swelling. In fact she has very little area of weeping at this point there's just a small spot on the lateral portion of her right leg that still has me just a little bit more concerned as far as wanting to see this clear up before I discharge her to caring for  this at home. Nonetheless overall she has made excellent progress. 12/09/17 on evaluation today patient appears to be doing rather well in regard to her lower extremity edema. She does have some weeping still in the left lower extremity although the big area we were taking care of two weeks ago actually has closed and she has another area of weeping on the left lower extremity immediately as well is the top of her foot. She does  not currently have lymphedema pumps she has been wearing her compression daily on a regular basis as directed. With that being said I think she may benefit from lymphedema pumps. 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 We had a very difficult time getting and keeping this under control. 12/23/17 on evaluation today patient actually appears to be doing a little bit more poorly in regard to her bilateral lower extremities. She has been tolerating the Juxta-Lite compression wraps. Unfortunately she has two new ulcers on the right lower extremity and left lower Trinity ulceration seems to be larger. Obviously this is not good news. She has been tolerating the dressings without complication. 12/30/17 on evaluation today patient actually appears to be doing much better in regard to her bilateral lower extremity edema. She continues to have some issues with ulcerations and in fact there appears to be one spot on each leg where the wrap may have caused a little bit of a blister which is subsequently opened up at this point is given her pain. Fortunately it does not appear to be any evidence of infection which is good news. No fevers chills noted. 01/13/18 on evaluation today patient appears to be doing rather well in regard to her bilateral lower extremities. The dressings did get kind of stuck as far as the wound beds are concerned but again I think this is mainly due to the fact that she actually seems to be showing signs of healing which is good news. She's not having as much drainage therefore she was having more of the dressing sticking. Nonetheless overall I feel like her swelling is dramatically down compared to previous. 01/20/18 on evaluation today patient unfortunately though she's doing better in most regards has a large blister on the left anterior  lower extremity where she is draining quite significantly. Subsequently this is going to need debridement today in order to see what's underneath and ensure she does not continue to trapping fluid at this location. Nonetheless No fevers, chills, nausea, or vomiting noted at this time. 01/27/18 on evaluation today patient appears to be doing rather well at this point in regard to her right lower extremity there's just a very small area that she still has open at this point. With that being said I do believe that she is tolerating the compression wraps very well in making good progress. Home health is coming out at this point to see her. Her left lower extremity on the lateral portion is actually what still mainly open and causing her some discomfort for the most part 02/10/18 on evaluation today patient actually appears to be doing very well in regard to her right lower extremity were all the ulcers appear to be completely close. In regard to the left lower extremity she does have two areas still open and some leaking from the dorsal surface of her foot but this still seems to  be doing much better to me in general. 02/24/18 on evaluation today patient actually appears to be doing much better in regard to her right lower extremity this is still completely healed. Her left lower extremity is also doing much better fortunately she has no evidence of infection. The one area that is gonna require some debridement is still on the left anterior shin. Fortunately this is not hurting her as badly today. 03/10/18 on evaluation today patient appears to be doing better in some regards although she has a little bit more open area on the dorsal foot and she also has some issues on the medial portion of the left lower extremity which is actually new and somewhat deep. With that being said there fortunately does not appear to be any significant signs of infection which is good news. No fevers, chills, nausea, or vomiting  noted at this time. In general her swelling seems to be doing fairly well which is good news. 03/31/18 on evaluation today patient presents for follow-up concerning her left lower extremity lymphedema. Unfortunately she has been doing a little bit more poorly since I last saw her in regard to the amount of weeping that she is experiencing. She's also having some increased pain in the anterior shin location. Unfortunately I do not feel like the patient is making such good progress at this point a few weeks back she was definitely doing much better. 04/07/18 on evaluation today patient actually appears to be showing some signs of improvement as far as the left lower extremity is concerned. She has been tolerating the dressing changes and it does appear that the Drawtex did better for her. With that being said unfortunately home health is stating that they cannot obtain the Drawtex going forward. Nonetheless we're gonna have to check and see what they may be able to get the alginate they were using was getting stuck in causing new areas of skin being pulled all that with and subsequently weep and calls her to worsen overall this is the first time we've seen improvement at this time. 04/14/18 on evaluation today patient actually appears to be doing rather well at this point there does not appear to be any evidence of infection at this time and she is actually doing excellent in regard to the weeping in fact she almost has no openings remaining even compared to just last week this is a dramatic improvement. No fevers chills noted 04/21/18 evaluation today patient actually appears to be doing very well. She in fact is has a small area on the posterior lower extremity location and she has a small area on the dorsal surface of her foot that are still open both of which are very close to closing. We're hoping this will be close shortly. She brought her Juxta-Lite wrap with her today hoping that would be able to  put her in it unfortunately I don't think were quite at that point yet but we're getting closer. 04/28/18 upon evaluation today patient actually appears to be doing excellent in regard to her left lower extremity ulcer. In fact the region on the posterior lower extremity actually is much smaller than previously noted. Overall I'm very happy with the progress she has made. She again did bring her Juxta-Lite although we're not quite ready for that yet. 05/11/18 upon evaluation today patient actually appears to be doing in general fairly well in regard to her left lower Trinity. The swelling is very well controlled. With that being said she has a  new area on the left anterior lower extremity as well as between the first and second toes of her left foot that was not present during the last evaluation. The region of her posterior left lower extremity actually appears to be almost completely healed. T be honest I'm very pleased with o the way that stands. Nonetheless I do believe that the lotion may be keeping the area to moist as far as her legs are concerned subsequently I'm gonna consider discontinuing that today. 05/26/18 on evaluation today patient appears to be doing rather well in regard to her left lower should be ulcers. In fact everything appears to be close except for a very small area on the left posterior lower extremity. Fortunately there does not appear to be any evidence of infection at this time. Overall very pleased with her progress. 06/02/18 and evaluation today patient actually appears to be doing very well in regard to her lower extremity ulcers. She has one small area that still continues to weep that I think may benefit her being able to justify lotion and user Juxta-Lite wraps versus continued to wrap her. Nonetheless I think this is something we can definitely look into at this point. 06/23/18 on evaluation today patient unfortunately has openings of her bilateral lower extremities.  In general she seems to be doing much worse than when I last saw her just as far as her overall health standpoint is concerned. She states that her discomfort is mainly due to neuropathy she's not having any other issues otherwise. No fevers, chills, nausea, or vomiting noted at this time. 06/30/18 on evaluation today patient actually appears to be doing a little worse in regard to her right lower extremity her left lower extremity of doing fairly well. Fortunately there is no sign of infection at this time. She has been tolerating the dressing changes without complication. Home health did not come out like they were supposed to for the appropriate wrap changes. They stated that they never received the orders from [place] which were fax. Nonetheless we will send a copy of the orders with the patient today as well. 07/07/18 on evaluation today patient appears to be doing much better in regard to lower extremities. She still has several openings bilaterally although since I last saw her her legs did show obvious signs of infection when she later saw her nurse. Subsequently a culture was obtained and she is been placed on Bactrim and Keflex. Fortunately things seem to be looking much better it does appear she likely had an infection. Again last week we'd even discussed it but again there really was not any obvious sign that she had infection therefore we held off on the antibiotics. Nonetheless I'm glad she's doing better today. 07/14/18 on evaluation today patient appears to be doing much better regarding her bilateral lower Trinity's. In fact on the right lower for me there's nothing open at this point there are some dry skin areas at the sites where she had infection. Fortunately there is no evidence of systemic infection which is excellent news. No fevers chills noted 07/21/18 on evaluation today patient actually appears to be doing much better in regard to her left lower extremity ulcers. She is making good  progress and overall I feel like she's improving each time I see her. She's having no pain I do feel like the infection is completely resolved which is excellent news. No fevers, chills, nausea, or vomiting noted at this time. 07/28/18 on evaluation today patient appears to  be doing very well in regard to her left lower [employer]. Everything seems to be showing signs of improvement which is excellent news. Overall very pleased with the progress that has been made. Fortunately there's no evidence of active infection at this time also excellent news. 08/04/18 on evaluation today patient appears to be doing more poorly in regard to her bilateral lower extremities. She has two new areas open up on the right and these were completely closed as of last week. She still has the two spots on the left which in my pinion seem to be doing better. Fortunately there's no evidence of infection again at this point. 08/11/18 on evaluation today patient actually appears to be doing very well in regard to her bilateral lower Trinity wounds that all seem to be doing better and are measures smaller today. Fortunately there's no signs of infection. No fevers, chills, nausea, or vomiting noted at this time. 08/18/18 on evaluation today patient actually appears to be doing about the same inverter bilateral lower extremities. She continues to have areas that blistering open as was drain that fortunately nothing too significant. Overall I feel like Drawtex may have done better for her however compared to the collagen. 08/25/18 on evaluation today patient appears to be doing a little bit more poorly today even compared to last time I saw her. Again I'm not exactly sure why she's making worse progress over the past several weeks. I'm beginning to wonder if there is some kind of underlying low level infection causing this issue. I did actually take a culture from the left anterior lower extremity but it was a new wound draining  quite a bit at this point. Unfortunately she also seems to be having more pain which is what also makes me worried about the possibility of infection. This is despite never erythema noted at this point. 09/01/18 on evaluation today patient actually appears to be doing a little worse even compared to last week in regard to bilateral lower extremities. She did go to the hospital on the 16th was given a dose of IV Zosyn and then discharged with a recommendation to continue with the Bactrim that I previously prescribed for her. Nonetheless she is still having a lot of discomfort she tells me as well at this time. This is definitely unfortunate. No fevers, chills, nausea, or vomiting noted at this time. 09/08/18 on evaluation today patient's bilateral lower extremities actually appear to be shown signs of improvement which is good news. Fortunately there does not appear to be any signs of active infection I think the anabiotic is helping in this regard. Overall I'm very pleased with how she is progressing. 09/15/18 patient was actually seen in ER yesterday due to her legs as well unfortunately. She states she's been having a lot of pain and discomfort as well as a lot of drainage. Upon inspection today the patient does have a lot of swelling and drainage I feel like this is more related to lymphedema and poor fluid control than it is to infection based on what I'm seeing. The physician in the emergency department also doubted that the patient was having a significant infection nonetheless I see no evidence of infection obvious at this point although I do see evidence of poor fluid control. She still not using a compression pumps, she is not elevating due to her lift chair as well as her hospital bed being broken, and she really is not keeping her legs up as much as they  should be and also has been taking off her wraps. All this combined I think has led to poor fluid control and to be honest she may be somewhat  volume overloaded in general as well. I recommend that she may need to contact your physician to see if a prescription for a diuretic would be beneficial in their opinion. As long as this is safe I think it would likely help her. 09/29/18 on evaluation today patient's left lower extremity actually appears to be doing quite a bit better. At least compared to last time that I saw her. She still has a large area where she is draining from but there's a lot of new skin speckled trout and in fact there's more new skin that there are open areas of weeping and drainage at this point. This is good news. With regard to the right lower extremity this is doing much better with the only open area that I really see being a dry spot on the right lateral ankle currently. Fortunately there's no signs of active infection at this time which is good news. No fevers, chills, nausea, or vomiting noted at this time. The patient seems somewhat stressed and overwhelmed during the visit today she was very lethargic as such. She does and she is not taking any pain medications at this point. Apparently according to her husband are also in the process of moving which is probably taking its toll on her as well. 10/06/18 on evaluation today patient appears to be doing rather well in regard to her lower extremities compared to last evaluation. Fortunately there's no signs of active infection. She tells me she did have an appointment with her primary. Nonetheless he was concerned that the wounds were somewhat deep based on pictures but we never actually saw her legs. She states that he had her somewhat worried due to the fact that she was fearing now that she was [place] have to have an amputation. With that being said based on what I'm seeing check she looks better this week that she has the last two times I've seen her with much less drainage I'm actually pleased in this regard. That doesn't mean that she's out of the water but again I  do not think what the point of talking about education at all in regard to her leg. She is very happy to hear this. She is also not having as much pain as she was having last week. 10/13/18 unfortunately on evaluation today patient still continues to have a significant amount of drainage she's not letting home health actually apply the compression dressings at this point. She's trying to use of Juxta-Lite of the top of Kerlex and the second layer of the three layer compression wrap. With that being said she just does not seem to be making as good a progress as I would expect if she was having the compression applied and in place on a regular basis. No fevers, chills, nausea, or vomiting noted at this time. 10/20/18 on evaluation today patient appears to be doing a little better in regard to her bilateral lower extremity ulcers. In fact the right lower extremity seems to be healed she doesn't even have any openings at this point left lower extremity though still somewhat macerated seems to be showing signs of new skin growth at multiple locations throughout. Fortunately there's no evidence of active infection at this time. No fevers, chills, nausea, or vomiting noted at this time. 10/27/18 on evaluation today patient appears to  be doing much better in regard to her left lower Trinity ulcer. She's been tolerating the laptop complication and has minimal drainage noted at this point. Fortunately there's no signs of active infection at this time. No fevers, chills, nausea, or vomiting noted at this time. 11/03/18 on evaluation today patient actually appears to be doing excellent in regard to her left lower extremity. She is having very little drainage at this point there does not appear to be any significant signs of infection overall very pleased with how things have gone. She is likewise extremely pleased still and seems to be making wonderful progress week to week. I do believe antibiotics were helpful for  her. Her primary care provider did place on amateur clean since I last saw her. 11/17/18 on evaluation today patient appears to be doing worse in regard to her bilateral lower extremities at this point. She is been tolerating the dressing changes without complication. With that being said she typically takes the [place] off fairly quickly upon arriving home even after being seen here in the clinic and does not allow home health reapply command as part of the dressing at home. Therefore she said no compression essentially since I last saw her as best I can tell. With that being said I think it shows and how much swelling she has in the open wounds that are noted at this point. Fortunately there's no signs of infection but unfortunately if she doesn't get this under control I think she will end up with infection and more significant issues. 11/24/18 on evaluation today patient actually appears to be doing somewhat better in regard to her bilateral lower extremities. 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 Nonetheless that is doing better currently so she should be able to attempt reinitiating the lymphedema pumps at this time. No fevers, chills, nausea, or vomiting noted at this time. 12/01/18 upon evaluation today patient's left lower extremity appears to be doing quite well unfortunately her right lower extremity is not doing nearly as well. She has been tolerating the dressing changes without complication unfortunately she did not keep a wrap on the right at this time. Nonetheless I believe this has led to increased swelling and weeping in the world is actually much larger than during the last evaluation with her. 12/08/18 on evaluation today patient appears to be doing about the same at this point in regard to her right lower extremity. There is some more  palatable to touch I'm concerned about the possibility of there being some infection although I think the main issue is she's not keeping her compression wrap on which in turn is not allowing this area to heal appropriately. 12/22/18 on evaluation today patient appears to be doing better in regard to left lower extremity unfortunately significantly worse in regard to the right lower extremity. The areas of blistering and necrotic superficial tissue have spread and again this does not really appear to be signs of infection and all she just doesn't seem to be doing nearly as well is what she has been in the past. Overall I feel like the Augmentin did absolutely nothing for her she doesn't seem to have any infection again I really didn't think so last time either is more of a potential preventative measure and hoping that this would make some difference  but I think the main issue is she's not wearing her compression. She tells me she cannot wear the [place] been we put on she takes it off pretty much upon getting home. Subsequently she worshiped Juxta-Lite when I questioned her about how often she wears it this is no more than three hours a day obviously that leaves 21 hours that she has no compression and this is obviously not doing well for her. Overall I'm concerned that if things continue to worsen she is at great risk of both infection as well as losing her leg. 01/05/19 on evaluation today patient appears to be doing well in regard to her left lower extremity which he is allowing [place] to wrap and not so well with regard to her right lower extremity which she is not allowing [place] to really wrap and keep the wrap on. She states that it hurts too badly whenever it's wrapped and she ends up having to take it off. She's been using the Juxta-Lite she tells me up to six hours a day although I question whether or not that's really been the case to be honest. Previously she told me three hours today nonetheless  obviously the legs as long as the wrap is doing great when she is not is doing much more poorly. 01/12/2019 on evaluation today patient actually appears to be doing a little better in my opinion with regard to her right lower extremity ulcer. She has a small open area on the left lower extremity unfortunately but again this I think is part of the normal fluctuation of what she is going to have to expect with regard to her legs especially when she is not using her lymphedema pumps on a regular basis. Subsequently based on what I am seeing today I think that she does seem to be doing slightly better with regard to her right lower extremity she did see her primary care provider on Monday they felt she had an infection and placed her on 2 antibiotics. Both Cipro and clindamycin. Subsequently again she seems possibly to be doing a little bit better in regards to the right lower extremity she also tells me however she has been wearing the compression wrap over the past week since I spoke with her as well that is a Kerlix and Coban wrap on the right. No fevers, chills, nausea, vomiting, or diarrhea. 01/19/2019 on evaluation today patient appears to be doing better with regard to her bilateral lower extremities especially the right. I feel like the compression has been beneficial for her which is great news. She did get a call from her primary care provider on her way here today telling her that she did have methicillin- resistant Staphylococcus aureus and he was calling in a couple new antibiotics for her including a ointment to be applied she tells me 3 times a day. With that being said this sounds like likely to be Bactroban which I think could be applied with each dressing/wrap change but I would not be able to accommodate her applying this 3 times a day. She is in agreement with the least doing this we will add that to her orders today. 01/26/2019 on evaluation today patient actually appears to be doing much  better with regard to her right lower extremity. Her left lower extremity is also doing quite well all things considering. Fortunately there is no evidence of active infection at this time. No fevers, chills, nausea, vomiting, or diarrhea. 02/02/2019 on evaluation today patient appears to  be doing much better compared to her last evaluation. Little by little off like her right leg is returning more towards normal. There does not appear to be any signs of active infection and overall she seems to be doing quite well which is great news. I am very pleased in this regard. No fevers, chills, nausea, vomiting, or diarrhea. 02/09/2019 upon evaluation today patient appears to be doing better with regard to her bilateral lower extremities. She has been tolerating the dressing changes without complication. Fortunately there is no signs of active infection at this time. No fevers, chills, nausea, vomiting, or diarrhea. 02/23/2019 on evaluation today patient actually appears to be doing quite well with regard to her bilateral lower extremities. She has been tolerating the dressing changes without complication. She is even used her pumps one time and states that she really felt like it felt good. With that being said she seems to be in good spirits and her legs appear to be doing excellent. 03/09/2019 on evaluation today patient appears to be doing well with regard to her right lower extremity there are no open wounds at this time she is having some discomfort but I feel like this is more neuropathy than anything. With regard to her left lower extremity she had several areas scattered around that she does have some weeping and drainage from but again overall she does not appear to be having any significant issues and no evidence of infection at this time which is good news. 03/23/2019 on evaluation today patient appears to be doing well with regard to her right lower extremity which she tells me is still close she is  using her juxta light here. Her left lower extremity she mainly just has an area on the foot which is still slightly draining although this also is doing great. Overall very pleased at this time. 04/06/2019 patient appears to be doing a little bit worse in regard to her left lower extremity upon evaluation today. She feels like this could be becoming infected again which she had issues with previous. Fortunately there is no signs of systemic infection but again this is always a struggle with her with her legs she will go from doing well to not so well in a very short amount of time. 04/20/2019 on evaluation today patient actually appears to be doing quite well with regard to her right lower extremity I do not see any signs of active infection at this time. Fortunately there is no fever chills noted. She is still taking the antibiotics which I prescribed for her at this point. In regard to the left lower extremity I do feel like some of these areas are better although again she still is having weeping from several locations at this time. 04/27/2019 on evaluation today patient appears to be doing about the same if not slightly worse in regard to her left lower extremity ulcers. She tells me when questioned that she has been sleeping in her Hoveround chair in fact she tells me she falls asleep without even knowing it. I think she is spending a whole lot of time in the chair and less time walking and moving around which is not good for her legs either. On top of that she is in a seated position which is also the worst position she is not really elevating her legs and she is also not using her lymphedema pumps. All this is good to contribute to worsening of her condition in general. 05/18/2019 on evaluation today patient  appears to be doing well with regard to her lower extremity on the right in fact this is showing no signs of any open wounds at this time. On the left she is continuing to have issues with  areas that do drain. Some of the regions have healed and there are couple areas that have reopened. She did go to the ER per the patient according to recommendations from the home health nurse due to what she was seen when she came out on 05/13/2019. Subsequently she felt like the patient needed to go to the hospital due to the fact that again she was having "milky white discharge" from her leg. Nonetheless she had and then was placed on doxycycline and subsequently seems to be doing better. 06/01/2019 upon evaluation today patient appears to be doing really in my opinion about the same. I do not see any signs of active infection which is good news. Overall she still has wounds over the bilateral lower extremities she has reopened on the right but this appears to be more of a crack where there is weeping/edema coming from the region. I do not see any evidence of infection at either site based on what I visualized today. 07/13/2019 upon evaluation today patient appears to be doing a little worse compared to last time I saw her. She since has been in the hospital from 06/21/2019 through 06/29/2019. This was secondary to having Covid. During that time they did apply lotion to her legs which unfortunately has caused her to develop a myriad of open wounds on her lower extremities. Her legs do appear to be doing better as far as the overall appearance is concerned but nonetheless she does have more open and weeping areas. 07/27/2019 upon evaluation today patient appears to be doing more poorly to be honest in regard to her left lower extremity in particular. There is no signs of systemic infection although I do believe she may have local infection. She notes she has been having a lot of blue/green drainage which is consistent potentially with Pseudomonas. That may be something that we need to consider here as well. The doxycycline does not seem to have been helping. 08/03/2019 upon evaluation today patient  appears to be doing a little better in my opinion compared to last week's evaluation. Her culture I did review today and she is on appropriate medications to help treat the Enterobacter that was noted. Overall I feel like that is good news. With that being said she is unfortunately continuing to have a lot of drainage and though it is doing better I still think she has a long ways to go to get things dried up in general. Fortunately there is no signs of systemic infection. 08/10/2019 upon evaluation today patient appears to be doing may be slightly better in regard to her left lower extremity the right lower extremity is doing much better. Fortunately there is no signs of infection right now which is good news. No fevers, chills, nausea, vomiting, or diarrhea. 08/24/2019 on evaluation today patient appears to be doing slightly better in regard to her lower extremities. The left lower extremity seems to be healed the right lower extremity is doing better though not completely healed as far as the openings are concerned. She has some generalized issues here with edema and weeping secondary to her lymphedema though again I do believe this is little bit drier compared to prior weeks evaluations. In general I am very pleased with how things seem to be progressing.  No fevers, chills, nausea, vomiting, or diarrhea. 08/31/2019 upon evaluation today patient actually seems to making some progress here with regard to the left lower extremity in particular. She has been tolerating the dressing changes without complication. Fortunately there is no signs of active infection at this time. No fevers, chills, nausea, vomiting, or diarrhea. She did see Dr. Doren Custard and he did note that she did have a issue with the left great saphenous vein and the small saphenous vein in the leg. With that being said he was concerned about the possibility of laser ablation not being extremely successful. He also mentioned a small risk of DVT  associated with the procedure. However if the wounds do not continue to improve he stated that that would probably be the way to go. Fortunately the patient's legs do seem to be doing much better. 09/07/2019 upon evaluation today patient appears to be doing better with regard to her lower extremities. She has been tolerating the dressing changes without complication. With that being said she is showing signs of improvement and overall very pleased. There are some areas on her leg that I think we do need to debride we discussed this last week the patient is in agreement with doing that as long as it does not hurt too badly. 09/14/2019 upon evaluation today patient appears to be doing decently well with regard to her left lower extremity. She is not having near as much weeping as she has had in the past things seem to be drying up which is good news. There is no signs of active infection at this time. 09/21/19 upon evaluation today patient appears to be doing better in regard overall to her bilateral lower extremities. She again has less open than she did previous and each week I feel like this is getting better. Fortunately there is no signs of active infection at this time. No fevers, chills, nausea, vomiting, or diarrhea. 09/28/2019 upon evaluation today patient actually appears to be showing signs of improvement with regard to her left lower extremity. Unfortunately the right medial lower extremity around the ankle region has reopened to some degree but this appears to be minimal still which is good news. There is no signs of active infection at this time which is also good news. 10/12/2019 upon evaluation today patient appears to be doing okay with regard to her bilateral lower extremities today. The right is a little bit worse then last evaluation 2 weeks ago. The left is actually doing a little better in my opinion. Overall there is no signs of active infection at this time that I see. Obviously that  something we have to keep a close eye on she is very prone to this with the significant and multiple openings that she has over the bilateral lower extremities. 10/19/2019 upon evaluation today patient appears to be doing about the best that I have seen her in quite some time. She has been tolerating the dressing changes without complication. There does not appear to be any signs of active infection and overall I am extremely happy with the way her legs appeared. She is drying up quite nicely and overall is having less pain. 11/09/2019 upon evaluation today patient appears to be doing better in regard to her wounds. She seems to be drying up more and more each time I see her this is just taking a very long time. Fortunately there is no signs of active infection at this time. 11/23/2019 upon evaluation today patient actually appears to be  doing excellent in regard to her lower extremities at this point compared to where she has been. Fortunately there is no signs of active infection at this time. She did go to the hospital last week for nausea and vomiting completely unrelated to her wounds. Fortunately she is doing better she was given some Reglan and got better. She had associated abdominal pain but they never found out what was going on. 12/07/2019 upon evaluation today patient appears to be doing well for the most part in regard to her legs. She unfortunately has not been keeping the Coban portion of her wraps on therefore the compression has not really been sufficient for what it is supposed to be. Nonetheless she tells me that it just hurt too bad therefore she removed it. 12/21/2019 upon evaluation today patient actually appears to be doing quite well with regard to her legs. I do feel like she has been making progress which is great news and overall there is no signs of active infection at this time. No fevers, chills, nausea, vomiting, or diarrhea. 01/04/2020 upon evaluation today patient presents for  follow-up concerning her lower extremity edema bilaterally. She still has open wounds she has not been using her lymphedema pumps. She is also not been utilizing her compression wraps appropriately she tends to unwrap them, take them off, or states that they hurt. Obviously the reason they hurt is because her legs start to swell but the issue is if she would use her compression/lymphedema pumps regularly she would not swell and she would have the pain. Nonetheless she has not even picked them up once honestly over the past several months and may be even as much as in the past year based on my opinion and what have seen. She tells me today that after last week when I talked about this with her specifically actually that was 2 weeks ago that she "forgot". 8//21 on evaluation today patient appears to be doing a little better in regard to her legs bilaterally. Fortunately there is no signs of active infection at this time. She tells me that she used her lymphedema pumps all of one time over the past 2 weeks since I last saw her. She tells me that she has been too busy in order to continue to use these. 02/01/2020 on evaluation today patient appears to be doing some better in regard to her wounds in general in her legs. We felt the right was healed although is not completely it does appear to be doing better she tells me she has been using her lymphedema pumps that she has had this six times since I last saw her. Obviously the more she does that the better she would do my opinion 02/15/2020 upon evaluation today patient appears to be doing about the same in regard to her legs. She tells me that she is pumping I'm still not sure how much she does to be perfectly honest. However even if she does a little bit here and there I guess that is better than nothing. Fortunately there is no sign of active infection at this time which is great news. No fevers, chills, nausea, vomiting, or  diarrhea. Objective Constitutional Well-nourished and well-hydrated in no acute distress. Vitals Time Taken: 11:37 AM, Height: 62 in, Weight: 335 lbs, BMI: 61.3, Temperature: 97.9 F, Pulse: 85 bpm, Respiratory Rate: 18 breaths/min, Blood Pressure: 132/80 mmHg. Respiratory normal breathing without difficulty. Psychiatric this patient is able to make decisions and demonstrates good insight into disease process.  Alert and Oriented x 3. pleasant and cooperative. General Notes: Patient's wound bed actually showed signs of good granulation at this time there does not appear to be any evidence of active infection and overall very pleased with where things stand. No fevers, chills, nausea, vomiting, or diarrhea. Integumentary (Hair, Skin) Wound #61 status is Open. Original cause of wound was Gradually Appeared. The wound is located on the Left,Circumferential Lower Leg. The wound measures 15cm length x 17.5cm width x 0.1cm depth; 206.167cm^2 area and 20.617cm^3 volume. There is Fat Layer (Subcutaneous Tissue) exposed. There is no tunneling or undermining noted. There is a large amount of serous drainage noted. The wound margin is flat and intact. There is medium (34-66%) pink, pale granulation within the wound bed. There is a medium (34-66%) amount of necrotic tissue within the wound bed including Adherent Slough. Wound #64 status is Open. Original cause of wound was Gradually Appeared. The wound is located on the Left,Dorsal Foot. The wound measures 5cm length x 5.2cm width x 0.1cm depth; 20.42cm^2 area and 2.042cm^3 volume. There is Fat Layer (Subcutaneous Tissue) exposed. There is no tunneling or undermining noted. There is a medium amount of serous drainage noted. The wound margin is flat and intact. There is large (67-100%) pink, pale granulation within the wound bed. There is a small (1-33%) amount of necrotic tissue within the wound bed including Adherent Slough. Wound #65 status is Open.  Original cause of wound was Gradually Appeared. The wound is located on the Right,Medial Lower Leg. The wound measures 0.3cm length x 0.3cm width x 0.1cm depth; 0.071cm^2 area and 0.007cm^3 volume. There is Fat Layer (Subcutaneous Tissue) exposed. There is no tunneling or undermining noted. There is a medium amount of serosanguineous drainage noted. The wound margin is distinct with the outline attached to the wound base. There is large (67-100%) red granulation within the wound bed. There is no necrotic tissue within the wound bed. 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 There was a Three Layer Compression Therapy Procedure by Carlene Coria, RN. Post procedure Diagnosis Wound #61: Same as Pre-Procedure Wound #65 Pre-procedure diagnosis of Wound #65 is a Venous Leg Ulcer located on the Right,Medial Lower Leg . There was a Three Layer Compression Therapy Procedure by Carlene Coria, RN. Post procedure Diagnosis Wound #65: Same as Pre-Procedure Plan Follow-up Appointments: Return Appointment in 2 weeks. Dressing Change Frequency: Wound #61 Left,Circumferential Lower Leg: Change dressing three times week. Wound #64 Left,Dorsal Foot: Change  dressing three times week. Wound #65 Right,Medial Lower Leg: Change dressing three times week. Skin Barriers/Peri-Wound Care: Moisturizing lotion - to dry skin Other: - discontinue barrier cream (zinc paste) Wound Cleansing: Clean wound with Wound Cleanser - wash legs with soap and water with dressing changes May shower with protection. Primary Wound Dressing: Wound #61 Left,Circumferential Lower Leg: Calcium Alginate with Silver Wound #64 Left,Dorsal Foot:  Calcium Alginate with Silver Wound #65 Right,Medial Lower Leg: Calcium Alginate with Silver Secondary Dressing: Wound #61 Left,Circumferential Lower Leg: Dry Gauze ABD pad - as needed Wound #64 Left,Dorsal Foot: Dry Gauze ABD pad - as needed Wound #65 Right,Medial Lower Leg: Dry Gauze Edema Control: 3 Layer Compression System - Bilateral Avoid standing for long periods of time - walking is encouraged Elevate legs to the level of the heart or above for 30 minutes daily and/or when sitting, a frequency of: - do not sleep in chair with feet dangling, MUST elevate legs while sitting Exercise regularly Segmental Compressive Device. - lymphedema pumps 60 minutes 1- 2 times per day Off-Loading: Turn and reposition every 2 hours Additional Orders / Instructions: Follow Nutritious Diet - T include vitamin A, vitamin C, and Zinc along with increased protein intake. o Home Health: [facility] for [facility]. - [facility]. I'm going to suggest that we going to continue with the wound care measures as before with regard to the silver alginate dressing we will however discontinue the use of zinc I think this may be causing more problems than good. 2. I'm also can recommend that we have the patient continue to monitor for any signs of infection although right now she really hasn't had any infection issues she mainly just has more lymphedema/weeping issues. 3. We will continue with the 3 layer compression wraps  bilaterally. We will see patient back for reevaluation in 2 weeks here in the clinic. If anything worsens or changes patient will contact our office for additional recommendations. Electronic Signature(s) Signed: 02/15/2020 1:49:52 PM By: [name] PA-C Entered By: [name] on 02/15/2020 13:49:52 -------------------------------------------------------------------------------- SuperBill Details Patient Name: Date of Service: [name] 02/15/2020 Medical Record Number: [id number] Patient Account Number: [id number] Date of Birth/Sex: Treating RN: July 14, 1950 (69 y.o. [name] Primary Care Provider: [name] Other Clinician: Referring Provider: Treating Provider/Extender: Doyle Askew, FRED Weeks in Treatment: 132 Diagnosis Coding ICD-10 Codes Code Description E11.622 Type 2 diabetes mellitus with other skin ulcer I89.0 Lymphedema, not elsewhere classified I87.331 Chronic venous hypertension (idiopathic) with ulcer and inflammation of right lower extremity I87.332 Chronic venous hypertension (idiopathic) with ulcer and inflammation of left lower extremity L97.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 leg with fat layer exposed L97.822 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 Physician Procedures : CPT4 Code Description Modifier [id number] - WC PHYS LEVEL 3 - EST PT 1 ICD-10  Diagnosis Description E11.622 Type 2 diabetes mellitus with other skin ulcer I89.0 Lymphedema, not elsewhere classified I87.331 Chronic venous hypertension (idiopathic)  with ulcer and inflammation of right lower extremity I87.332 Chronic venous hypertension (idiopathic) with ulcer and inflammation of left lower extremity Quantity: Electronic Signature(s) Signed: 02/15/2020 1:50:09 PM By: [name] PA-C Entered By: [name] on 02/15/2020 13:50:04

## 2020-02-16 NOTE — Progress Notes (Signed)
[name], [name] ([id number]) Visit Report for 02/15/2020 Arrival Information Details Patient Name: Date of Service: [name], [name] 02/15/2020 10:45 A M Medical Record Number: [id number] Patient Account Number: [id number] Date of Birth/Sex: Treating RN: 09/04/1950 (69 y.o. Martyn Malay, Linda Primary Care Ravenna Legore: [name] Other Clinician: Referring Vasil Juhasz: Treating Raylinn Kosar/Extender: Doyle Askew, FRED Weeks in Treatment: 132 Visit Information History Since Last Visit Added or deleted any medications: No Patient Arrived: Wheel Chair Any new allergies or adverse reactions: No Arrival Time: 11:36 Had a fall or experienced change in No Accompanied By: self activities of daily living that may affect Transfer Assistance: None risk of falls: Patient Identification Verified: Yes Signs or symptoms of abuse/neglect since last visito No Secondary Verification Process Completed: Yes Hospitalized since last visit: No Patient Requires Transmission-Based Precautions: No Implantable device outside of the clinic excluding No Patient Has Alerts: Yes cellular tissue based products placed in the center Patient Alerts: R ABI= 1.01 since last visit: L ABI = .99 Has Dressing in Place as Prescribed: Yes Pain Present Now: Yes Electronic Signature(s) Signed: 02/16/2020 9:05:51 AM By: Sandre Kitty Entered By: Sandre Kitty on 02/15/2020 11:37:51 -------------------------------------------------------------------------------- Compression Therapy Details Patient Name: Date of Service: [name]. 02/15/2020 10:45 A M Medical Record Number: [id number] Patient Account Number: [id number] Date of Birth/Sex: Treating RN: Aug 10, 1950 (70 y.o. [name] Primary Care Zamariyah Furukawa: [name] Other Clinician: Referring Osiah Haring: Treating Kesi Perrow/Extender: Doyle Askew, FRED Weeks in Treatment: 132 Compression Therapy Performed for Wound Assessment: Wound #61  Left,Circumferential Lower Leg Performed By: Clinician Carlene Coria, RN Compression Type: Three Layer Post Procedure Diagnosis Same as Pre-procedure Electronic Signature(s) Signed: 02/15/2020 6:44:49 PM By: [name] RN, BSN Entered By: [name] on 02/15/2020 11:55:36 -------------------------------------------------------------------------------- Compression Therapy Details Patient Name: Date of Service: [name]. 02/15/2020 10:45 A M Medical Record Number: [id number] Patient Account Number: [id number] Date of Birth/Sex: Treating RN: 1951-03-19 (69 y.o. [name] Primary Care Secundino Ellithorpe: [name] Other Clinician: Referring Mehtab Dolberry: Treating Jeremiyah Cullens/Extender: Doyle Askew, FRED Weeks in Treatment: 132 Compression Therapy Performed for Wound Assessment: Wound #65 Right,Medial Lower Leg Performed By: Clinician Carlene Coria, RN Compression Type: Three Layer Post Procedure Diagnosis Same as Pre-procedure Electronic Signature(s) Signed: 02/15/2020 6:44:49 PM By: [name] RN, BSN Entered By: [name] on 02/15/2020 11:55:37 -------------------------------------------------------------------------------- Encounter Discharge Information Details Patient Name: Date of Service: [name]. 02/15/2020 10:45 A M Medical Record Number: [id number] Patient Account Number: [id number] Date of Birth/Sex: Treating RN: 07-02-1950 (69 y.o. [name] Primary Care Jaedah Lords: [name] Other Clinician: Referring Dedria Endres: Treating Lakesa Coste/Extender: Doyle Askew, FRED Weeks in Treatment: [phone] Encounter Discharge Information Items Discharge Condition: Stable Ambulatory Status: Wheelchair Discharge Destination: Home Transportation: Private Auto Accompanied By: self Schedule Follow-up Appointment: Yes Clinical Summary of Care: Patient Declined Electronic Signature(s) Signed: 02/15/2020 5:45:10 PM By: Carlene Coria RN Entered  By: Carlene Coria on 02/15/2020 12:24:12 -------------------------------------------------------------------------------- Lower Extremity Assessment Details Patient Name: Date of Service: [name], [name] 02/15/2020 10:45 A M Medical Record Number: [id number] Patient Account Number: [id number] Date of Birth/Sex: Treating RN: [birth year] (69 y.o. [name] Primary Care Detria Cummings: [name] Other Clinician: Referring Nhi Butrum: Treating Camren Henthorn/Extender: Doyle Askew, FRED Weeks in Treatment: 132 Edema Assessment Assessed: [Left: No] [Right: No] Edema: [Left: Yes] [Right: Yes] Calf Left: Right: Point of Measurement: 37 cm From Medial Instep 45 cm 41 cm Ankle Left: Right: Point of Measurement: 10 cm From Medial Instep 28 cm 23  cm Electronic Signature(s) Signed: 02/15/2020 5:45:10 PM By: Carlene Coria RN Entered By: Carlene Coria on 02/15/2020 11:44:05 -------------------------------------------------------------------------------- [place] Details Patient Name: Date of Service: [name]. 02/15/2020 10:45 A M Medical Record Number: [id number] Patient Account Number: [id number] Date of Birth/Sex: Treating RN: 1950-07-30 (69 y.o. [name] Primary Care Pratt Bress: [name] Other Clinician: Referring Avraham Benish: Treating [name]/Extender: Doyle Askew, FRED Weeks in Treatment: [phone] Active Inactive Venous Leg Ulcer Nursing Diagnoses: Actual venous Insuffiency (use after diagnosis is confirmed) Knowledge deficit related to disease process and management Goals: Patient will maintain optimal edema control Date Initiated: 08/12/2017 Target Resolution Date: 03/14/2020 Goal Status: Active Patient/caregiver will verbalize understanding of disease process and disease management Date Initiated: 08/12/2017 Date Inactivated: 04/21/2018 Target Resolution Date: 04/24/2018 Goal Status: Met Interventions: Assess peripheral edema status  every visit. Compression as ordered Treatment Activities: Therapeutic compression applied : 08/12/2017 Notes: Wound/Skin Impairment Nursing Diagnoses: Impaired tissue integrity Knowledge deficit related to ulceration/compromised skin integrity Goals: Patient/caregiver will verbalize understanding of skin care regimen Date Initiated: 08/12/2017 Target Resolution Date: 03/14/2020 Goal Status: Active Ulcer/skin breakdown will have a volume reduction of 30% by week 4 Date Initiated: 08/05/2017 Date Inactivated: 09/30/2017 Target Resolution Date: 10/03/2017 Goal Status: Met Ulcer/skin breakdown will have a volume reduction of 50% by week 8 Date Initiated: 09/30/2017 Date Inactivated: 10/28/2017 Target Resolution Date: 10/28/2017 Goal Status: Met Interventions: Assess patient/caregiver ability to perform ulcer/skin care regimen upon admission and as needed Assess ulceration(s) every visit Provide education on ulcer and skin care Screen for HBO Treatment Activities: Patient referred to home care : 08/05/2017 Skin care regimen initiated : 08/05/2017 Topical wound management initiated : 08/05/2017 Notes: Electronic Signature(s) Signed: 02/15/2020 6:44:49 PM By: [name] RN, BSN Entered By: [name] on 02/15/2020 11:51:07 -------------------------------------------------------------------------------- Pain Assessment Details Patient Name: Date of Service: [name]. 02/15/2020 10:45 A M Medical Record Number: [id number] Patient Account Number: [id number] Date of Birth/Sex: Treating RN: 1950/11/13 (69 y.o. [name] Primary Care Emmilyn Crooke: [name] Other Clinician: Referring Tyniah Kastens: Treating Khianna Blazina/Extender: Doyle Askew, FRED Weeks in Treatment: [phone] Active Problems Location of Pain Severity and Description of Pain Patient Has Paino Yes Site Locations Rate the pain. Current Pain Level: 6 Pain Management and Medication Current Pain  Management: Electronic Signature(s) Signed: 02/15/2020 6:44:49 PM By: [name] RN, BSN Signed: 02/16/2020 9:05:51 AM By: Sandre Kitty Entered By: Sandre Kitty on 02/15/2020 11:37:44 -------------------------------------------------------------------------------- Patient/Caregiver Education Details Patient Name: Date of Service: [name] 9/1/2021andnbsp10:45 [place] Record Number: [id number] Patient Account Number: [id number] Date of Birth/Gender: Treating RN: June 12, 1951 (69 y.o. [name] Primary Care Physician: [name] Other Clinician: Referring Physician: Treating Physician/Extender: Doyle Askew, FRED Weeks in Treatment: [phone] Education Assessment Education Provided To: Patient Education Topics Provided Venous: Methods: Explain/Verbal Responses: Reinforcements needed, State content correctly Wound/Skin Impairment: Methods: Explain/Verbal Responses: Reinforcements needed, State content correctly Electronic Signature(s) Signed: 02/15/2020 6:44:49 PM By: [name] RN, BSN Entered By: [name] on 02/15/2020 11:51:54 -------------------------------------------------------------------------------- Wound Assessment Details Patient Name: Date of Service: [name]. 02/15/2020 10:45 A M Medical Record Number: [id number] Patient Account Number: [id number] Date of Birth/Sex: Treating RN: 1950-12-12 (68 y.o. [name] Primary Care Buck Mcaffee: [name] Other Clinician: Referring Elsye Mccollister: Treating Sanah Kraska/Extender: Doyle Askew, FRED Weeks in Treatment: 132 Wound Status Wound Number: 61 Primary Venous Leg Ulcer Etiology: Wound Location: Left, Circumferential Lower Leg Wound Open Wounding Event: Gradually Appeared Status: Date Acquired: 04/20/2019 Comorbid  Asthma, Hypertension, Peripheral Arterial Disease, Peripheral Weeks Of Treatment: 43 History: Venous Disease, Type II Diabetes, Gout,  Osteoarthritis Clustered Wound: Yes Wound Measurements Length: (cm) 15 Width: (cm) 17.5 Depth: (cm) 0.1 Clustered Quantity: 3 Area: (cm) 206.167 Volume: (cm) 20.617 % Reduction in Area: -2634.3% % Reduction in Volume: -2634.4% Epithelialization: Small (1-33%) Tunneling: No Undermining: No Wound Description Classification: Full Thickness Without Exposed Support Structures Wound Margin: Flat and Intact Exudate Amount: Large Exudate Type: Serous Exudate Color: amber Foul Odor After Cleansing: No Slough/Fibrino Yes Wound Bed Granulation Amount: Medium (34-66%) Exposed Structure Granulation Quality: Pink, Pale Fascia Exposed: No Necrotic Amount: Medium (34-66%) Fat Layer (Subcutaneous Tissue) Exposed: Yes Necrotic Quality: Adherent Slough Tendon Exposed: No Muscle Exposed: No Joint Exposed: No Bone Exposed: No Treatment Notes Wound #61 (Left, Circumferential Lower Leg) 1. Cleanse With Wound Cleanser Soap and water 3. Primary Dressing Applied Calcium Alginate Ag 4. Secondary Dressing ABD Pad Dry Gauze 6. Support Layer Applied 3 layer compression wrap Notes netting Electronic Signature(s) Signed: 02/15/2020 5:45:10 PM By: Carlene Coria RN Signed: 02/15/2020 6:44:49 PM By: [name] RN, BSN Entered By: Carlene Coria on 02/15/2020 11:44:47 -------------------------------------------------------------------------------- Wound Assessment Details Patient Name: Date of Service: [name]. 02/15/2020 10:45 A M Medical Record Number: [id number] Patient Account Number: [id number] Date of Birth/Sex: Treating RN: [birth year] (69 y.o. [name] Primary Care Zeki Bedrosian: [name] Other Clinician: Referring Tyan Dy: Treating Keymon Mcelroy/Extender: Doyle Askew, FRED Weeks in Treatment: 132 Wound Status Wound Number: 64 Primary Diabetic Wound/Ulcer of the Lower Extremity Etiology: Wound Location: Left, Dorsal Foot Wound Open Wounding Event: Gradually  Appeared Status: Date Acquired: 06/01/2019 Comorbid Asthma, Hypertension, Peripheral Arterial Disease, Peripheral Weeks Of Treatment: 37 History: Venous Disease, Type II Diabetes, Gout, Osteoarthritis Clustered Wound: No Wound Measurements Length: (cm) 5 Width: (cm) 5.2 Depth: (cm) 0.1 Area: (cm) 20.42 Volume: (cm) 2.042 % Reduction in Area: -12275.8% % Reduction in Volume: -4067.3% Epithelialization: Small (1-33%) Tunneling: No Undermining: No Wound Description Classification: Grade 1 Wound Margin: Flat and Intact Exudate Amount: Medium Exudate Type: Serous Exudate Color: amber Foul Odor After Cleansing: No Slough/Fibrino Yes Wound Bed Granulation Amount: Large (67-100%) Exposed Structure Granulation Quality: Pink, Pale Fascia Exposed: No Necrotic Amount: Small (1-33%) Fat Layer (Subcutaneous Tissue) Exposed: Yes Necrotic Quality: Adherent Slough Tendon Exposed: No Muscle Exposed: No Joint Exposed: No Bone Exposed: No Treatment Notes Wound #64 (Left, Dorsal Foot) 1. Cleanse With Wound Cleanser Soap and water 3. Primary Dressing Applied Calcium Alginate Ag 4. Secondary Dressing ABD Pad Dry Gauze 6. Support Layer Applied 3 layer compression wrap Notes netting Electronic Signature(s) Signed: 02/15/2020 5:45:10 PM By: Carlene Coria RN Signed: 02/15/2020 6:44:49 PM By: [name] RN, BSN Entered By: Carlene Coria on 02/15/2020 11:45:11 -------------------------------------------------------------------------------- Wound Assessment Details Patient Name: Date of Service: [name]. 02/15/2020 10:45 A M Medical Record Number: [id number] Patient Account Number: [id number] Date of Birth/Sex: Treating RN: [birth year] (69 y.o. [name] Primary Care Thompson Mckim: [name] Other Clinician: Referring Guy Seese: Treating Shalondra Wunschel/Extender: Doyle Askew, FRED Weeks in Treatment: 132 Wound Status Wound Number: 65 Primary Venous Leg  Ulcer Etiology: Wound Location: Right, Medial Lower Leg Wound Open Wounding Event: Gradually Appeared Status: Date Acquired: 09/28/2019 Comorbid Asthma, Hypertension, Peripheral Arterial Disease, Peripheral Weeks Of Treatment: 20 History: Venous Disease, Type II Diabetes, Gout, Osteoarthritis Clustered Wound: No Wound Measurements Length: (cm) 0.3 Width: (cm) 0.3 Depth: (cm) 0.1 Area: (cm) 0.071 Volume: (cm) 0.007 % Reduction in Area: 93.5% % Reduction in Volume: 93.6% Epithelialization:  Large (67-100%) Tunneling: No Undermining: No Wound Description Classification: Full Thickness Without Exposed Support Structures Wound Margin: Distinct, outline attached Exudate Amount: Medium Exudate Type: Serosanguineous Exudate Color: red, brown Foul Odor After Cleansing: No Slough/Fibrino No Wound Bed Granulation Amount: Large (67-100%) Exposed Structure Granulation Quality: Red Fascia Exposed: No Necrotic Amount: None Present (0%) Fat Layer (Subcutaneous Tissue) Exposed: Yes Tendon Exposed: No Muscle Exposed: No Joint Exposed: No Bone Exposed: No Treatment Notes Wound #65 (Right, Medial Lower Leg) 1. Cleanse With Wound Cleanser Soap and water 3. Primary Dressing Applied Calcium Alginate Ag 4. Secondary Dressing ABD Pad Dry Gauze 6. Support Layer Applied 3 layer compression wrap Notes netting Electronic Signature(s) Signed: 02/15/2020 5:45:10 PM By: Carlene Coria RN Signed: 02/15/2020 6:44:49 PM By: [name] RN, BSN Entered By: Carlene Coria on 02/15/2020 11:45:50 -------------------------------------------------------------------------------- [place] Details Patient Name: Date of Service: [name]. 02/15/2020 10:45 A M Medical Record Number: [id number] Patient Account Number: [id number] Date of Birth/Sex: Treating RN: Jul 22, 1950 (69 y.o. [name] Primary Care Sears Oran: [name] Other Clinician: Referring Elvie Palomo: Treating Brandy Kabat/Extender:  Doyle Askew, FRED Weeks in Treatment: 132 Vital Signs Time Taken: 11:37 Temperature (F): 97.9 Height (in): 62 Pulse (bpm): 85 Weight (lbs): 335 Respiratory Rate (breaths/min): 18 Body Mass Index (BMI): 61.3 Blood Pressure (mmHg): 132/80 Reference Range: 80 - 120 mg / dl Electronic Signature(s) Signed: 02/16/2020 9:05:51 AM By: Sandre Kitty Entered By: Sandre Kitty on 02/15/2020 11:37:32

## 2020-02-27 ENCOUNTER — Encounter: Payer: Self-pay | Admitting: Neurology

## 2020-02-27 ENCOUNTER — Telehealth: Payer: Self-pay | Admitting: Neurology

## 2020-02-27 ENCOUNTER — Ambulatory Visit: Payer: Medicare PPO | Admitting: Neurology

## 2020-02-27 VITALS — BP 125/70 | HR 76 | Ht 65.0 in | Wt 322.5 lb

## 2020-02-27 DIAGNOSIS — G4700 Insomnia, unspecified: Secondary | ICD-10-CM

## 2020-02-27 DIAGNOSIS — F329 Major depressive disorder, single episode, unspecified: Secondary | ICD-10-CM

## 2020-02-27 DIAGNOSIS — Z794 Long term (current) use of insulin: Secondary | ICD-10-CM

## 2020-02-27 DIAGNOSIS — L97909 Non-pressure chronic ulcer of unspecified part of unspecified lower leg with unspecified severity: Secondary | ICD-10-CM

## 2020-02-27 DIAGNOSIS — R413 Other amnesia: Secondary | ICD-10-CM

## 2020-02-27 DIAGNOSIS — E119 Type 2 diabetes mellitus without complications: Secondary | ICD-10-CM | POA: Diagnosis not present

## 2020-02-27 MED ORDER — ESCITALOPRAM OXALATE 10 MG PO TABS: 10.0000 mg | ORAL_TABLET | Freq: Every day | ORAL | 11 refills | Status: DC

## 2020-02-27 NOTE — Telephone Encounter (Signed)
Francine Graven Berkley Harvey: [id number] (exp. 02/27/20 to 03/28/20) order sent to GI. They will reach out to the patient to schedule.

## 2020-02-27 NOTE — Progress Notes (Signed)
[facility]  PATIENT: [name] DOB: [birth year]  REFERRING DOCTOR OR PCP:   [name], FNP SOURCE: Patient, notes from primary care, imaging and laboratory reports, MRI and CT images personally reviewed  _________________________________   HISTORICAL  CHIEF COMPLAINT:  Chief Complaint  Patient presents with  . New Patient (Initial Visit)    RM 13. Paper referral from Sonia Side., FNP for memory changes, hx TIA, microvascular changes  . Gait Problem    In WC  . Memory Loss    Memory started getting worse about 3 months ago. MOCA today: 20/30     HISTORY OF PRESENT ILLNESS:  I had the pleasure seeing your patient, [name], at [facility] for neurologic consultation regarding her memory loss.  She is a 69 year old woman with memory loss.    She feels her memory is a little worse.  She notes she had some problems a couple years ago, then did better but is having more trouble again.   She notes that she is not following conversations as well and sometimes forgets a word.   Her husband noted she seemed worse about 4-6 months ago.   He notes she seems to get intensely angry at times with very short fuse   She is less empathetic and hurtful at times.   She is apathetic.     She is binge eating sometimes.      She is sleeping poorly at night but sleeps more during the day.   She was tested for OSA a few years ago.  She has gained a few pounds over the past 6 months but weights about the same as a couple years ago.   Her husband notes she snores but has not noted any apneic signs.     She feels depressed .   She sees wound care for her left leg (diabetic ulcers) for the past 18 months and is depressed about lack of healing.      She has not been on any anti-depressant  She is less mobile and now can only use walker a few steps.   She can transfer independently.  She has a power chair in the home as she can't self propel well due to  shoulder pain.    I personally reviewed the MRI of the brain from 05/31/2016.  Brain volume is normal for age.  She has some scattered T2/FLAIR hyperintense foci in the hemispheres and right pons consistent with mild chronic microvascular ischemic change.  There were no acute findings.  CT scan from 2018 showed normal brain volume  Montreal Cognitive Assessment  02/27/2020  Visuospatial/ Executive (0/5) 3  Naming (0/3) 3  Attention: Read list of digits (0/2) 1  Attention: Read list of letters (0/1) 1  Attention: Serial 7 subtraction starting at 100 (0/3) 1  Language: Repeat phrase (0/2) 1  Language : Fluency (0/1) 1  Abstraction (0/2) 2  Delayed Recall (0/5) 2  Orientation (0/6) 5  Total 20  Adjusted Score (based on education) 20      REVIEW OF SYSTEMS: Constitutional: No fevers, chills, sweats, or change in appetite Eyes: No visual changes, double vision, eye pain Ear, nose and throat: No hearing loss, ear pain, nasal congestion, sore throat Cardiovascular: No chest pain, palpitations Respiratory: No shortness of breath at rest or with exertion.   No wheezes GastrointestinaI: No nausea, vomiting, diarrhea, abdominal pain, fecal incontinence Genitourinary: No dysuria, urinary retention or frequency.  No nocturia. Musculoskeletal:  No neck pain, back pain Integumentary: No rash, pruritus, skin lesions Neurological: as above Psychiatric: No depression at this time.  No anxiety Endocrine: No palpitations, diaphoresis, change in appetite, change in weigh or increased thirst Hematologic/Lymphatic: No anemia, purpura, petechiae. Allergic/Immunologic: No itchy/runny eyes, nasal congestion, recent allergic reactions, rashes  ALLERGIES: Allergies  Allergen Reactions  . Ace Inhibitors Swelling  . Tizanidine   . Metformin And Related Other (See Comments)    CHILLS  . Propofol Itching    HOME MEDICATIONS:  Current Outpatient Medications:  .  ACCU-CHEK FASTCLIX LANCETS MISC, 1  each as needed., Disp: , Rfl:  .  ACCU-CHEK SMARTVIEW test strip, 1 each 4 (four) times daily. for testing, Disp: , Rfl: 0 .  acetaminophen (TYLENOL) 500 MG tablet, Take 500 mg by mouth daily as needed for moderate pain., Disp: , Rfl:  .  albuterol (PROVENTIL HFA;VENTOLIN HFA) 108 (90 Base) MCG/ACT inhaler, Inhale 2 puffs into the lungs every 4 (four) hours as needed for wheezing or shortness of breath. , Disp: , Rfl:  .  allopurinol (ZYLOPRIM) 300 MG tablet, Take 300 mg by mouth daily., Disp: , Rfl:  .  aspirin 325 MG tablet, Take 325 mg by mouth daily., Disp: , Rfl:  .  atenolol (TENORMIN) 25 MG tablet, Take 25-50 mg by mouth 2 (two) times daily. Take 50 mg every morning and then 25 mg every evening, Disp: , Rfl:  .  Blood Glucose Monitoring Suppl (ACCU-CHEK NANO SMARTVIEW) w/Device KIT, 1 each See admin instructions., Disp: , Rfl: 0 .  Cholecalciferol (VITAMIN D3) 2000 units capsule, Take 2,000 Units by mouth daily., Disp: , Rfl:  .  cyclobenzaprine (FLEXERIL) 5 MG tablet, Take 5 mg by mouth 3 (three) times daily as needed., Disp: , Rfl:  .  diclofenac sodium (VOLTAREN) 1 % GEL, Apply 4 g topically 4 (four) times daily., Disp: 1 Tube, Rfl: 1 .  Diclofenac Sodium CR (VOLTAREN-XR) 100 MG 24 hr tablet, Take 1 tablet (100 mg total) by mouth daily., Disp: 10 tablet, Rfl: 0 .  furosemide (LASIX) 20 MG tablet, Take 1 tablet (20 mg total) by mouth daily., Disp: 30 tablet, Rfl: 0 .  gabapentin (NEURONTIN) 300 MG capsule, Take 1 capsule (300 mg total) by mouth 2 (two) times daily. (Patient taking differently: Take 600 mg by mouth 2 (two) times daily. ), Disp: 60 capsule, Rfl: 2 .  hydrocerin (EUCERIN) CREA, Apply 1 application topically daily., Disp: 228 g, Rfl: 0 .  HYDROcodone-acetaminophen (NORCO) 10-325 MG tablet, Take 1 tablet by mouth every 6 (six) hours as needed., Disp: , Rfl:  .  insulin degludec (TRESIBA FLEXTOUCH) 100 UNIT/ML SOPN FlexTouch Pen, Inject 0.75 mLs (75 Units total) into the skin  daily. (Patient taking differently: Inject 75 Units into the skin every morning. ), Disp: 3 mL, Rfl: 1 .  NIFEdipine (PROCARDIA XL/ADALAT-CC) 90 MG 24 hr tablet, Take 90 mg by mouth daily., Disp: , Rfl:  .  NOVOFINE 32G X 6 MM MISC, 1 each as needed., Disp: , Rfl:  .  NOVOLOG FLEXPEN 100 UNIT/ML FlexPen, Take 17 Units by mouth 2 (two) times daily., Disp: , Rfl: 1 .  polyethylene glycol (MIRALAX / GLYCOLAX) packet, Take 17 g by mouth daily as needed for moderate constipation., Disp: , Rfl:  .  potassium chloride (K-DUR) 10 MEQ tablet, Take 1 tablet (10 mEq total) by mouth daily., Disp: 30 tablet, Rfl: 0 .  protein supplement shake (PREMIER PROTEIN) LIQD, Take 325 mLs (11 oz total)  by mouth daily., Disp: , Rfl: 0  PAST MEDICAL HISTORY: Past Medical History:  Diagnosis Date  . Anxiety   . Arthritis    "back, arms, legs" (03/24/2016)  . Asthma   . Chronic lower back pain   . Colonic polyp    last colonoscopy done in 2009 with normal results per medical record  . Depressive disorder   . Gastric polyp   . Gout    has taken allopurinol 348m once daily in past  . Headache   . History of hiatal hernia   . Hypertension   . Migraine    "none in awhile; might have a couple/year" (03/24/2016)  . Mixed hyperlipidemia    01/2016 Total chol 141, HDL 59, LDL 63, ration 1.1  . Osteoarthritis   . TIA (transient ischemic attack) 11/2014  . [place]     PAST SURGICAL HISTORY: Past Surgical History:  Procedure Laterality Date  . ARTERY BIOPSY Right 08/12/2016   Procedure: BIOPSY TEMPORAL ARTERY;  Surgeon: CElam [name] MD;  [facility]  Service: Vascular;  Laterality: Right;  BIOPSY TEMPORAL ARTERY  . BREAST BIOPSY Left ~ 2015   benign  . CARPAL TUNNEL RELEASE Bilateral   . DILATION AND CURETTAGE OF UTERUS    . KN[place] ARTHROS[place] Right [place] LAPAROSCOPIC CHOLECYSTECTOMY    . TUBAL LIGATION    . VAGINAL HYSTERECTOMY  1982    FAMILY HISTORY: Family  History  Problem Relation Age of Onset  . Stroke Father   . Stroke Brother   . Cancer Other   . Gout Mother   . Hypertension Mother   . Arthritis Mother   . Asthma Mother   . Stroke Brother     SOCIAL HISTORY:  Social History   Socioeconomic History  . Marital status: Married    Spouse name: Not on file  . Number of children: Not on file  . Years of education: 12+  . Highest education level: Not on file  Occupational History  . Not on file  Tobacco Use  . Smoking status: Never Smoker  . Smokeless tobacco: Never Used  Vaping Use  . Vaping Use: Never used  Substance and Sexual Activity  . Alcohol use: No  . Drug use: No  . Sexual activity: Never  Other Topics Concern  . Not on file  Social History Narrative   Right handed   Caffeine use: none   Lives with husband   [place]   . Difficulty of Paying Living Expenses: Not on file  Food Insecurity:   . Worried About RCharity fundraiserin the Last Year: Not on file  . Ran Out of Food in the Last Year: Not on file  Transportation Needs:   . Lack of Transportation (Medical): Not on file  . Lack of Transportation (Non-Medical): Not on file  Physical Activity:   . Days of Exercise per Week: Not on file  . Minutes of Exercise per Session: Not on file  Stress:   . Feeling of Stress : Not on file  Social Connections:   . Frequency of Communication with Friends and Family: Not on file  . Frequency of Social Gatherings with Friends and Family: Not on file  . Attends Religious Services: Not on file  . Active Member of Clubs or Organizations: Not on file  . Attends Club or  Organization Meetings: Not on file  . Marital Status: Not on file  Intimate Partner Violence:   . Fear of Current or Ex-Partner: Not on file  . Emotionally Abused: Not on file  . Physically Abused: Not on file  . Sexually Abused: Not on file     PHYSICAL  EXAM  Vitals:   02/27/20 1010  BP: 125/70  Pulse: 76  SpO2: 96%  Weight: (!) 322 lb 8 oz (146.3 kg)  Height: 5' 5"  (1.651 m)    Body mass index is 53.67 kg/m.   General: The patient is an obese woman and in no acute distress  HEENT:  Head is [place]/AT.  Sclera are anicteric.  Funduscopic exam shows normal optic discs and retinal vessels.  Neck: No carotid bruits are noted.  The neck is nontender.  Cardiovascular: The heart has a regular rate and rhythm with a normal S1 and S2. There were no murmurs, gallops or rubs.    Skin: Extremities show chronic edema and related skin changes   Neurologic Exam  Mental status: The patient is alert and oriented x 3 at the time of the examination. The patient has reduced recent and intact remote memory, with an apparently reduced attention span and concentration ability.   Speech is normal.   MoCA was 20/30 (mild dementia)  Cranial nerves: Extraocular movements are full.  Facial sensation is symmetric.  Facial strength is normal.  Trapezius and sternocleidomastoid strength is normal. No dysarthria is noted.  No obvious hearing deficits are noted.  Motor:  Muscle bulk is normal.   Tone is normal. Strength is  5 / 5 in the arms and knees.  She is 4+/5 with hip flexion and 4+/5 in the feet  Sensory: Sensory testing is intact to pinprick, soft touch and vibration sensation in the arms.  She has mildly reduced touch and vibration in the feet.  Coordination: Cerebellar testing reveals good finger-nose-finger and heel-to-shin bilaterally.  Gait and station: She requires bilateral support to stand  Reflexes: Deep tendon reflexes are symmetric and normal in arms and absent at the legs.   Right Plantar responses are flexor (left foot wrapped)    DIAGNOSTIC DATA (LABS, IMAGING, TESTING) - I reviewed patient records, labs, notes, testing and imaging myself where available.  Lab Results  Component Value Date   WBC 9.9 11/18/2019   HGB 13.0  11/18/2019   HCT 41.7 11/18/2019   MCV 91.2 11/18/2019   PLT 307 11/18/2019      Component Value Date/Time   NA 138 11/18/2019 0251   K 4.1 11/18/2019 0251   CL 102 11/18/2019 0251   CO2 25 11/18/2019 0251   GLUCOSE 215 (H) 11/18/2019 0251   BUN 15 11/18/2019 0251   CREATININE 0.80 11/18/2019 0251   CREATININE 0.88 01/25/2016 0001   CALCIUM 9.2 11/18/2019 0251   PROT 8.7 (H) 11/18/2019 0251   ALBUMIN 3.5 11/18/2019 0251   AST 14 (L) 11/18/2019 0251   ALT 15 11/18/2019 0251   ALKPHOS 110 11/18/2019 0251   BILITOT 0.5 11/18/2019 0251   GFRNONAA >60 11/18/2019 0251   GFRAA >60 11/18/2019 0251   Lab Results  Component Value Date   CHOL 100 06/22/2019   HDL 39 (L) 06/22/2019   LDLCALC 48 06/22/2019   TRIG 64 06/22/2019   CHOLHDL 2.6 06/22/2019   Lab Results  Component Value Date   HGBA1C 7.7 (H) 06/22/2019   Lab Results  Component Value Date   VITAMINB12 543 06/01/2016   Lab  Results  Component Value Date   TSH 1.675 06/01/2016       ASSESSMENT AND PLAN  Memory loss - Plan: Thyroid Panel With TSH, Vitamin B12, CT HEAD WO CONTRAST  Major depressive disorder with current active episode, unspecified depression episode severity, unspecified whether recurrent - Plan: Thyroid Panel With TSH, Vitamin B12  Insomnia, unspecified type  Insulin-requiring or dependent type II diabetes mellitus ([place])  Chronic skin ulcer of lower leg ([place])    In summary, Ms. Dozier is a 69 year old woman with memory loss and behavioral changes.  On examination today, she scored 20/30 on the [facility] cognitive assessment.  Although this score is consistent with mild dementia, it is also possible that depression and poor sleep are contributing to her memory and other cognitive difficulties.  Because you are behavioral issues with lack of empathy, easy anger and some binge eating, frontotemporal dementia would be the differential diagnosis as well.  Alzheimer's is less likely with her  pattern.  We will check a thyroid panel and vitamin B12 to make sure that deficiencies are not contributing to her cognitive difficulty.  Additionally I will check a CT scan of the head to determine if there is an atrophy pattern consistent with FTD.  She has had difficulties with MRIs in the past due to claustrophobia.  She does appear to have depression and I will start Lexapro 10 mg.  Hopefully this will help some of her issues including the memory.  She will return to see [place] in 4 months or sooner if there are new or worsening neurologic symptoms.   Arly Salminen A. Felecia Shelling, MD, [facility] 07/31/8725, 61:84 AM Certified in Neurology, Clinical Neurophysiology, Sleep Medicine and Neuroimaging  [facility] [address] [place], [place] [address] [phone]

## 2020-02-28 LAB — THYROID PANEL WITH TSH
Free Thyroxine Index: 1.3 (ref 1.2–4.9)
T3 Uptake Ratio: 24 % (ref 24–39)
T4, Total: 5.6 ug/dL (ref 4.5–12.0)
TSH: 1.79 u[IU]/mL (ref 0.450–4.500)

## 2020-02-28 LAB — VITAMIN B12: Vitamin B-12: 415 pg/mL (ref 232–1245)

## 2020-02-29 ENCOUNTER — Encounter (HOSPITAL_COMMUNITY): Payer: Medicare PPO

## 2020-02-29 ENCOUNTER — Other Ambulatory Visit: Payer: Self-pay

## 2020-02-29 ENCOUNTER — Encounter (HOSPITAL_BASED_OUTPATIENT_CLINIC_OR_DEPARTMENT_OTHER): Payer: Medicare PPO | Admitting: Physician Assistant

## 2020-02-29 ENCOUNTER — Ambulatory Visit: Payer: Medicare PPO | Admitting: Vascular Surgery

## 2020-02-29 DIAGNOSIS — E11622 Type 2 diabetes mellitus with other skin ulcer: Secondary | ICD-10-CM | POA: Diagnosis not present

## 2020-02-29 NOTE — Progress Notes (Addendum)
BRANDEN, VINE ([id number]) Visit Report for 02/29/2020 Chief Complaint Document Details Patient Name: Date of Service: [name], [name] 02/29/2020 10:45 A M Medical Record Number: [id number] Patient Account Number: [id number] Date of Birth/Sex: Treating RN: 1950/12/26 (69 y.o. [name] Primary Care Provider: [name] Other Clinician: Referring Provider: Treating Provider/Extender: Doyle Askew, FRED Weeks in Treatment: [place] from: Patient Chief Complaint Bilateral reoccurring LE ulcers Electronic Signature(s) Signed: 02/29/2020 11:02:52 AM By: [name] PA-C Entered By: [name] on 02/29/2020 11:02:52 -------------------------------------------------------------------------------- HPI Details Patient Name: Date of Service: [name]. 02/29/2020 10:45 A M Medical Record Number: [id number] Patient Account Number: [id number] Date of Birth/Sex: Treating RN: 11-20-1950 (69 y.o. [name] Primary Care Provider: [name] Other Clinician: Referring Provider: Treating Provider/Extender: Doyle Askew, FRED Weeks in Treatment: 134 History of Present Illness HPI Description: this patient has been seen a couple of times before and returns with recurrent problems to her right and left lower extremity with swelling and weeping ulcerations due to not wearing her compression stockings which she had been advised to do during her last discharge, at the end of June 2018. During her last visit the patient had had normal arterial blood flow and her venous reflux study did not necessitate any surgical intervention. She was recommended compression and elevation and wound care. After prolonged treatment the patient was completely healed but she has been noncompliant with wearing or compressions.. She was here last week with an outpatient return visit planned but the patient came in a very poor general condition with altered  mental status and was rushed to the ER on my request. With a history of hypertension, diabetes, TIA and right-sided weakness she was set up for an MRI on her brain and cervical spine and was sent to [facility]. Getting an MRI done was very difficult but once the workup was done she was found not to have any spinal stenosis, epidural abscess or hematoma or discitis. This was radiculopathy to be treated as an outpatient and she was given a follow-up appointment. Today she is feeling much better alert and oriented and has come to reevaluate her bilateral lower extremity lymphedema and ulceration 03/25/2017 -- she was admitted to the hospital on 03/16/2017 and discharged on 03/18/2017 with left leg cellulitis and ulceration. She was started on vancomycin and Zosyn and x-ray showed no bony involvement. She was treated for a cellulitis with IV antibiotics changed to Rocephin and Flagyl and was discharged on oral Keflex and doxycycline to complete a 7 day course. Last hemoglobin A1c was 7.1 and her other ailments including hypertension got asthma were appropriately treated. 05/06/2017 -- she is awaiting the right size of compression stockings from [place] but other than that has been doing well. ====== Old notes 69 year old patient was seen one time last October and was lost to follow-up. She has recurrent problems with weeping and ulceration of her left lower extremity and has swelling of this for several years. It has been worse for the last 2 months. Past medical history is significant for diabetes mellitus type 2, hypertension, gout, morbid obesity, depressive disorders, hiatal hernia, migraines, status post knee surgery, risk of a cholecystectomy, vaginal hysterectomy and breast biopsy. She is not a smoker. As noted before she has never had a venous duplex study and an arterial ABI study was attempted but the left lower extremity was noncompressible 10/01/2016 -- had a lower extremity venous duplex  reflux evaluation which showed  no evidence of deep vein reflux in the right or left lower extremity, and no evidence of great saphenous vein reflux more than 500 ms in the right or left lower extremity, and the left small saphenous vein is incompetent but no vascular consult was recommended. review of her electronic medical records noted that the ABI was checked in July 2017 where the right ABI was normal limits and the left ABI could not be ascertained due to pain with cuff pressure but the waveforms are within normal limits. her arterial duplex study scheduled for April 27. 10/08/2016 -- the patient has various reasons for not having a compression on and for the last 3 days she has had no compression on her left lower extremity either due to pain or the lack of nursing help. She does not use her juxta lites either. 10/15/2016 -- the patient did not keep her appointment for arterial duplex study on April 27 and I have asked her to reschedule this. Her pain is out of proportion with the physical findings and she continuously fails to wear a compression wraps and cuts them off because she says she cannot tolerate the pain. She does not use her juxta lites either. 10/22/2016 -- he has rescheduled her arterial duplex study to May 21 and her pain today is a bit better. She has not been wearing her juxta lites on her right lower extremity but now understands that she needs to do this. She did tolerate the to press compression wrap on her left lower extremity 10/29/2016 --arterial duplex study is scheduled for next week and overall she has been tolerating her compression wraps and also using her juxta lites on her right lower extremity 11/05/2016 -- the right ABI was 0.95 the left was 1.03. The digit TBI is on the right was 0.83 on the left was 0.92 and she had biphasic flow through these vessels. The impression was that of normal lower extremity arterial study. 11/12/2016 -- her pain is minimal and she  is doing very well overall. 11/26/2016 -- she has got juxta lites and her insurance will not pay for additional dual layer compression stockings. She is going to order some from [employer]. 05/12/2017 -- her juxta lites are very old and too big for her and these have not been helping with compression. She did get 20-30 mm compression stockings from [facility] but she and her husband are unable to put these on. I believe she will benefit from bilateral Extremit-ease, compression stockings and we will measure her for these today. 05/20/2017 -- lymphedema on the left lower extremity has increased a lot and she has a open ulceration as a result of this. The right lower extremity is looking pretty good. She has decided to by the compression stockings herself and will get reimbursed by the home health, at a later date. 05/27/2017 -- her sciatica is bothering her a lot and she thought her left leg pain was caused due to the compression wrap and hence removed it and has significant lymphedema. There is no inflammation on this left lower extremity. 06/17/17 on evaluation today patient appears to be doing very well and in fact is completely healed in regard to her ulcerations. Unfortunately however she does have continued issues with lymphedema nonetheless. We did order compression garments for her unfortunately she states that the size that she received were large although we ordered medium. Obviously this means she is not getting the optimal compression. She does not have those with her today and therefore  we could not confirm and contact the company on her behalf. Nonetheless she does state that she is going to have her husband bring them by tomorrow so that we can verify and then get in touch with the company. No fevers, chills, nausea, or vomiting noted at this time. Overall patient is doing better otherwise and I'm pleased with the progress she has made. 07/01/17 on evaluation today patient appears to be doing  very well in regard to her bilateral lower extremity she does not have any openings at this point which is excellent news. Overall I'm pleased with how things have progressed up to this time. Since she is doing so well we did order her compression which we are seeing her today to ensure that it fits her properly and everything is doing well in that regard and then subsequently she will be discharged. ============ Old Notes: 03/31/16 patient presents today for evaluation concerning open wounds that she has over the left medial ankle region as well as the left dorsal foot. She has previously had this occur although it has been healed for a number of months after having this for about a year prior until her hospitalization on 01/05/16. At that point in time it appears that she was admitted to the hospital for left lower extremity cellulitis and was placed on vancomycin and Zosyn at that point. Eventually upon discharge on January 15, 2016 she was placed on doxycycline at that point in time. Later on 03/27/16 positive wound culture growing Escherichia coli this was switched to amoxicillin. Currently she tells me that she is having pain radiated to be a 7 out of 10 which can be as high as 10 out of 10 with palpation and manipulation of the wound. This wound appears to be mainly venous in nature due to the bilateral lower extremity venous stasis/lymphedema. This is definitely much worse on her left than the right side. She does have type 1 diabetes mellitus, hypertension, morbid obesity, and is wheelchair dependent.during the course of the hospital stay a blood culture was also obtained and fortunately appeared negative. She also had an x-ray of the tibia/fibula on the left which showed no acute bone abnormality. Her white blood cell count which was performed last on 03/25/16 was 7.3, hemoglobin 12.8, protein 7.1, albumin 3.0. Her urine culture appeared to be negative for any specific organisms. Patient did  have a left lower extremity venous duplex evaluation for DVT . This did not include venous reflux studies but fortunately was negative for DVT Patient also had arterial studies performed which revealed that she had a . normal ABI on the right though this was unable to be performed on the left secondary to pain that she was having around the ankle region due to the wound. However it was stated on report that she had biphasic pulses and apparently good blood flow. ========== 06/03/17 she is here in follow-up evaluation for right lower extremity ulcer. The right lower sure he has healed but she has reopened to the left medial malleolus and dorsal foot with weeping. She is waiting for new compression garments to arrive from home health, the previous compression garments were ill fitting. We will continue with compression bilaterally and follow-up in 2 weeks Readmission: 08/05/17 on evaluation today patient appears to be doing somewhat poorly in regard to her left lower extremity especially although the right lower extremity has a small area which may no longer be open. She has been having a lot of drainage from the  left lower extremity however he tells me that she has not been able to use the EXTREMIT-EASE Compression at this point. She states that she did better and was able to actually apply the Juxta-Lite compression although the wound that she has is too large and therefore really does not compress which is why she cannot wear it at this point. She has no one who can help her put it on regular basis her son can sometimes but he's not able to do it most of the time. I do believe that's why she has begun to weave and have issues as she is currently yet again. No fevers, chills, nausea, or vomiting noted at this time. Patient is no evidence of dementia. 08/12/17 on evaluation today patient appears to still be doing fairly well in regard to the draining areas/weeping areas at this point. With that being  said she unfortunately did go to the ER yesterday due to what was felt to be possibly a cellulitis. They place her on doxycycline by mouth and discharge her home. She definitely was not admitted. With that being said she states she has had more discomfort which has been unusual for her even compared to prior times and she's had infections.08/12/17 on evaluation today patient appears to still be doing fairly well in regard to the draining areas/weeping areas at this point. With that being said she unfortunately did go to the ER yesterday due to what was felt to be possibly a cellulitis. They place her on doxycycline by mouth and discharge her home. She definitely was not admitted. With that being said she states she has had more discomfort which has been unusual for her even compared to prior times and she's had infections. 08/19/17 put evaluation today patient tells me that she's been having a lot of what sounds to be neuropathic type pain in regard to her left lower extremity. She has been using over-the-counter topical bins again which some believe. That in order to apply the she actually remove the wrap we put on her last Wednesday on Thursday. Subsequently she has not had anything on compression wise since that time. The good news is a lot of the weeping areas appear to have closed at this point again I believe she would do better with compression but we are struggling to get her to actually use what she needs to at this point. No fevers, chills, nausea, or vomiting noted at this time. 09/03/17 on evaluation today patient appears to be doing okay in regard to her lower extremities in regard to the lymphedema and weeping. Fortunately she does not seem to show any signs of infection at this point she does have a little bit of weeping occurring in the right medial malleolus area. With that being said this does not appear to be too significant which is good news. 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 She has severe skin damage secondary to both of these features involving the dorsal left foot and medial left ankle and lower leg. Still has open areas in the left anterior foot. The area on the right closed over. She uses her own juxta light stockings. She does not have an arterial issue 09/16/17 on evaluation today patient actually appears to be doing excellent in regard to her bilateral lower extremity swelling. The Juxta-Lite compression wrap seem to be doing very well for her. She has not however been using the portion that goes over her foot.  Her left foot still is draining a little bit not nearly as significant as it has been in the past but still I do believe that she likely needs to utilize the full wrap including the foot portion of this will improve as well. She also has been apparently putting on a significant amount of Vaseline which also think is not helpful for her. I recommended that if she feels she needs something for moisturizer Eucerin will probably be better. 09/30/17 on evaluation today patient presents with several new open areas in regard to her left lower extremity although these appear to be minimal and mainly seem to be more moisture breakdown than anything. Fortunately she does not seem to have any evidence of infection which is great news. She has been tolerating the dressing changes without complication we are using silver alginate on the foot she has been using AB pads to have the legs and using her Juxta- Lite compression which seems to be controlling her swelling very well. Overall I'm pleased with the poor way she has progressed. 10/14/17 on evaluation today patient appears to be doing better in regard to her left lower extremity areas of weeping. She does still have some discomfort although in general this does not appear to be as macerated and I think it is progressing nicely. I do think she still  needs to wear the foot portion of her Juxta- Lite in order to get the most benefit from the wrap obviously. She states she understands. Fortunately there does not appear to be evidence of infection at this time which is great news. 10/28/17 on evaluation today patient appears to be doing excellent in regard to her left lower extremity. She has just a couple areas that are still open and seem to be causing any trouble whatsoever. For that reason I think that she is definitely headed in the right direction the spots are very tiny compared to what we have been dealing with in the past. 11/11/17 on evaluation today patient appears to have a right lateral lower extremity ulcer that has opened since I last saw her. She states this is where the home health nurse that was coming out remove the dressing without wetting the alginate first. Nonetheless I do not know if this is indeed the case or not but more importantly we have not ordered home help to be coming out for her wounds at all. I'm unsure as to why they are coming out and we're gonna have to check on this and get things situated in that regard. With that being said we currently really do not need them to be coming out as the patient has been taking care of her leg herself without complication and no issues. In fact she was doing much better prior to nursing coming out. 11/25/17 on evaluation today patient actually appears to be doing fairly well in regard to her left lower extremity swelling. In fact she has very little area of weeping at this point there's just a small spot on the lateral portion of her right leg that still has me just a little bit more concerned as far as wanting to see this clear up before I discharge her to caring for this at home. Nonetheless overall she has made excellent progress. 12/09/17 on evaluation today patient appears to be doing rather well in regard to her lower extremity edema. She does have some weeping still in the left  lower extremity although the big area we were taking care of two  weeks ago actually has closed and she has another area of weeping on the left lower extremity immediately as well is the top of her foot. She does not currently have lymphedema pumps she has been wearing her compression daily on a regular basis as directed. With that being said I think she may benefit from lymphedema pumps. 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 We had a very difficult time getting and keeping this under control. 12/23/17 on evaluation today patient actually appears to be doing a little bit more poorly in regard to her bilateral lower extremities. She has been tolerating the Juxta-Lite compression wraps. Unfortunately she has two new ulcers on the right lower extremity and left lower Trinity ulceration seems to be larger. Obviously this is not good news. She has been tolerating the dressings without complication. 12/30/17 on evaluation today patient actually appears to be doing much better in regard to her bilateral lower extremity edema. She continues to have some issues with ulcerations and in fact there appears to be one spot on each leg where the wrap may have caused a little bit of a blister which is subsequently opened up at this point is given her pain. Fortunately it does not appear to be any evidence of infection which is good news. No fevers chills noted. 01/13/18 on evaluation today patient appears to be doing rather well in regard to her bilateral lower extremities. The dressings did get kind of stuck as far as the wound beds are concerned but again I think this is mainly due to the fact that she actually seems to be showing signs of healing which is good news. She's not having as much drainage therefore she was having more of the dressing sticking.  Nonetheless overall I feel like her swelling is dramatically down compared to previous. 01/20/18 on evaluation today patient unfortunately though she's doing better in most regards has a large blister on the left anterior lower extremity where she is draining quite significantly. Subsequently this is going to need debridement today in order to see what's underneath and ensure she does not continue to trapping fluid at this location. Nonetheless No fevers, chills, nausea, or vomiting noted at this time. 01/27/18 on evaluation today patient appears to be doing rather well at this point in regard to her right lower extremity there's just a very small area that she still has open at this point. With that being said I do believe that she is tolerating the compression wraps very well in making good progress. Home health is coming out at this point to see her. Her left lower extremity on the lateral portion is actually what still mainly open and causing her some discomfort for the most part 02/10/18 on evaluation today patient actually appears to be doing very well in regard to her right lower extremity were all the ulcers appear to be completely close. In regard to the left lower extremity she does have two areas still open and some leaking from the dorsal surface of her foot but this still seems to be doing much better to me in general. 02/24/18 on evaluation today patient actually appears to be doing much better in regard to her right lower extremity this is still completely healed. Her left lower extremity is also doing much better fortunately she has no evidence of infection. The one  area that is gonna require some debridement is still on the left anterior shin. Fortunately this is not hurting her as badly today. 03/10/18 on evaluation today patient appears to be doing better in some regards although she has a little bit more open area on the dorsal foot and she also has some issues on the medial portion of  the left lower extremity which is actually new and somewhat deep. With that being said there fortunately does not appear to be any significant signs of infection which is good news. No fevers, chills, nausea, or vomiting noted at this time. In general her swelling seems to be doing fairly well which is good news. 03/31/18 on evaluation today patient presents for follow-up concerning her left lower extremity lymphedema. Unfortunately she has been doing a little bit more poorly since I last saw her in regard to the amount of weeping that she is experiencing. She's also having some increased pain in the anterior shin location. Unfortunately I do not feel like the patient is making such good progress at this point a few weeks back she was definitely doing much better. 04/07/18 on evaluation today patient actually appears to be showing some signs of improvement as far as the left lower extremity is concerned. She has been tolerating the dressing changes and it does appear that the Drawtex did better for her. With that being said unfortunately home health is stating that they cannot obtain the Drawtex going forward. Nonetheless we're gonna have to check and see what they may be able to get the alginate they were using was getting stuck in causing new areas of skin being pulled all that with and subsequently weep and calls her to worsen overall this is the first time we've seen improvement at this time. 04/14/18 on evaluation today patient actually appears to be doing rather well at this point there does not appear to be any evidence of infection at this time and she is actually doing excellent in regard to the weeping in fact she almost has no openings remaining even compared to just last week this is a dramatic improvement. No fevers chills noted 04/21/18 evaluation today patient actually appears to be doing very well. She in fact is has a small area on the posterior lower extremity location and she has  a small area on the dorsal surface of her foot that are still open both of which are very close to closing. We're hoping this will be close shortly. She brought her Juxta-Lite wrap with her today hoping that would be able to put her in it unfortunately I don't think were quite at that point yet but we're getting closer. 04/28/18 upon evaluation today patient actually appears to be doing excellent in regard to her left lower extremity ulcer. In fact the region on the posterior lower extremity actually is much smaller than previously noted. Overall I'm very happy with the progress she has made. She again did bring her Juxta-Lite although we're not quite ready for that yet. 05/11/18 upon evaluation today patient actually appears to be doing in general fairly well in regard to her left lower Trinity. The swelling is very well controlled. With that being said she has a new area on the left anterior lower extremity as well as between the first and second toes of her left foot that was not present during the last evaluation. The region of her posterior left lower extremity actually appears to be almost completely healed. T be honest I'm very pleased  with o the way that stands. Nonetheless I do believe that the lotion may be keeping the area to moist as far as her legs are concerned subsequently I'm gonna consider discontinuing that today. 05/26/18 on evaluation today patient appears to be doing rather well in regard to her left lower should be ulcers. In fact everything appears to be close except for a very small area on the left posterior lower extremity. Fortunately there does not appear to be any evidence of infection at this time. Overall very pleased with her progress. 06/02/18 and evaluation today patient actually appears to be doing very well in regard to her lower extremity ulcers. She has one small area that still continues to weep that I think may benefit her being able to justify lotion and user  Juxta-Lite wraps versus continued to wrap her. Nonetheless I think this is something we can definitely look into at this point. 06/23/18 on evaluation today patient unfortunately has openings of her bilateral lower extremities. In general she seems to be doing much worse than when I last saw her just as far as her overall health standpoint is concerned. She states that her discomfort is mainly due to neuropathy she's not having any other issues otherwise. No fevers, chills, nausea, or vomiting noted at this time. 06/30/18 on evaluation today patient actually appears to be doing a little worse in regard to her right lower extremity her left lower extremity of doing fairly well. Fortunately there is no sign of infection at this time. She has been tolerating the dressing changes without complication. Home health did not come out like they were supposed to for the appropriate wrap changes. They stated that they never received the orders from [place] which were fax. Nonetheless we will send a copy of the orders with the patient today as well. 07/07/18 on evaluation today patient appears to be doing much better in regard to lower extremities. She still has several openings bilaterally although since I last saw her her legs did show obvious signs of infection when she later saw her nurse. Subsequently a culture was obtained and she is been placed on Bactrim and Keflex. Fortunately things seem to be looking much better it does appear she likely had an infection. Again last week we'd even discussed it but again there really was not any obvious sign that she had infection therefore we held off on the antibiotics. Nonetheless I'm glad she's doing better today. 07/14/18 on evaluation today patient appears to be doing much better regarding her bilateral lower Trinity's. In fact on the right lower for me there's nothing open at this point there are some dry skin areas at the sites where she had infection. Fortunately there is  no evidence of systemic infection which is excellent news. No fevers chills noted 07/21/18 on evaluation today patient actually appears to be doing much better in regard to her left lower extremity ulcers. She is making good progress and overall I feel like she's improving each time I see her. She's having no pain I do feel like the infection is completely resolved which is excellent news. No fevers, chills, nausea, or vomiting noted at this time. 07/28/18 on evaluation today patient appears to be doing very well in regard to her left lower [employer]. Everything seems to be showing signs of improvement which is excellent news. Overall very pleased with the progress that has been made. Fortunately there's no evidence of active infection at this time also excellent news. 08/04/18 on evaluation  today patient appears to be doing more poorly in regard to her bilateral lower extremities. She has two new areas open up on the right and these were completely closed as of last week. She still has the two spots on the left which in my pinion seem to be doing better. Fortunately there's no evidence of infection again at this point. 08/11/18 on evaluation today patient actually appears to be doing very well in regard to her bilateral lower Trinity wounds that all seem to be doing better and are measures smaller today. Fortunately there's no signs of infection. No fevers, chills, nausea, or vomiting noted at this time. 08/18/18 on evaluation today patient actually appears to be doing about the same inverter bilateral lower extremities. She continues to have areas that blistering open as was drain that fortunately nothing too significant. Overall I feel like Drawtex may have done better for her however compared to the collagen. 08/25/18 on evaluation today patient appears to be doing a little bit more poorly today even compared to last time I saw her. Again I'm not exactly sure why she's making worse progress over  the past several weeks. I'm beginning to wonder if there is some kind of underlying low level infection causing this issue. I did actually take a culture from the left anterior lower extremity but it was a new wound draining quite a bit at this point. Unfortunately she also seems to be having more pain which is what also makes me worried about the possibility of infection. This is despite never erythema noted at this point. 09/01/18 on evaluation today patient actually appears to be doing a little worse even compared to last week in regard to bilateral lower extremities. She did go to the hospital on the 16th was given a dose of IV Zosyn and then discharged with a recommendation to continue with the Bactrim that I previously prescribed for her. Nonetheless she is still having a lot of discomfort she tells me as well at this time. This is definitely unfortunate. No fevers, chills, nausea, or vomiting noted at this time. 09/08/18 on evaluation today patient's bilateral lower extremities actually appear to be shown signs of improvement which is good news. Fortunately there does not appear to be any signs of active infection I think the anabiotic is helping in this regard. Overall I'm very pleased with how she is progressing. 09/15/18 patient was actually seen in ER yesterday due to her legs as well unfortunately. She states she's been having a lot of pain and discomfort as well as a lot of drainage. Upon inspection today the patient does have a lot of swelling and drainage I feel like this is more related to lymphedema and poor fluid control than it is to infection based on what I'm seeing. The physician in the emergency department also doubted that the patient was having a significant infection nonetheless I see no evidence of infection obvious at this point although I do see evidence of poor fluid control. She still not using a compression pumps, she is not elevating due to her lift chair as well as her  hospital bed being broken, and she really is not keeping her legs up as much as they should be and also has been taking off her wraps. All this combined I think has led to poor fluid control and to be honest she may be somewhat volume overloaded in general as well. I recommend that she may need to contact your physician to see if a  prescription for a diuretic would be beneficial in their opinion. As long as this is safe I think it would likely help her. 09/29/18 on evaluation today patient's left lower extremity actually appears to be doing quite a bit better. At least compared to last time that I saw her. She still has a large area where she is draining from but there's a lot of new skin speckled trout and in fact there's more new skin that there are open areas of weeping and drainage at this point. This is good news. With regard to the right lower extremity this is doing much better with the only open area that I really see being a dry spot on the right lateral ankle currently. Fortunately there's no signs of active infection at this time which is good news. No fevers, chills, nausea, or vomiting noted at this time. The patient seems somewhat stressed and overwhelmed during the visit today she was very lethargic as such. She does and she is not taking any pain medications at this point. Apparently according to her husband are also in the process of moving which is probably taking its toll on her as well. 10/06/18 on evaluation today patient appears to be doing rather well in regard to her lower extremities compared to last evaluation. Fortunately there's no signs of active infection. She tells me she did have an appointment with her primary. Nonetheless he was concerned that the wounds were somewhat deep based on pictures but we never actually saw her legs. She states that he had her somewhat worried due to the fact that she was fearing now that she was [place] have to have an amputation. With that being  said based on what I'm seeing check she looks better this week that she has the last two times I've seen her with much less drainage I'm actually pleased in this regard. That doesn't mean that she's out of the water but again I do not think what the point of talking about education at all in regard to her leg. She is very happy to hear this. She is also not having as much pain as she was having last week. 10/13/18 unfortunately on evaluation today patient still continues to have a significant amount of drainage she's not letting home health actually apply the compression dressings at this point. She's trying to use of Juxta-Lite of the top of Kerlex and the second layer of the three layer compression wrap. With that being said she just does not seem to be making as good a progress as I would expect if she was having the compression applied and in place on a regular basis. No fevers, chills, nausea, or vomiting noted at this time. 10/20/18 on evaluation today patient appears to be doing a little better in regard to her bilateral lower extremity ulcers. In fact the right lower extremity seems to be healed she doesn't even have any openings at this point left lower extremity though still somewhat macerated seems to be showing signs of new skin growth at multiple locations throughout. Fortunately there's no evidence of active infection at this time. No fevers, chills, nausea, or vomiting noted at this time. 10/27/18 on evaluation today patient appears to be doing much better in regard to her left lower Trinity ulcer. She's been tolerating the laptop complication and has minimal drainage noted at this point. Fortunately there's no signs of active infection at this time. No fevers, chills, nausea, or vomiting noted at this time. 11/03/18 on evaluation today  patient actually appears to be doing excellent in regard to her left lower extremity. She is having very little drainage at this point there does not appear  to be any significant signs of infection overall very pleased with how things have gone. She is likewise extremely pleased still and seems to be making wonderful progress week to week. I do believe antibiotics were helpful for her. Her primary care provider did place on amateur clean since I last saw her. 11/17/18 on evaluation today patient appears to be doing worse in regard to her bilateral lower extremities at this point. She is been tolerating the dressing changes without complication. With that being said she typically takes the Coban off fairly quickly upon arriving home even after being seen here in the clinic and does not allow home health reapply command as part of the dressing at home. Therefore she said no compression essentially since I last saw her as best I can tell. With that being said I think it shows and how much swelling she has in the open wounds that are noted at this point. Fortunately there's no signs of infection but unfortunately if she doesn't get this under control I think she will end up with infection and more significant issues. 11/24/18 on evaluation today patient actually appears to be doing somewhat better in regard to her bilateral lower extremities. 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 Nonetheless that is doing better currently so she should be able to attempt reinitiating the lymphedema pumps at this time. No fevers, chills, nausea, or vomiting noted at this time. 12/01/18 upon evaluation today patient's left lower extremity appears to be doing quite well unfortunately her right lower extremity is not doing nearly as well. She has been tolerating the dressing changes without complication unfortunately she did not keep a wrap on the right at this time. Nonetheless I believe this has led to increased swelling and weeping in  the world is actually much larger than during the last evaluation with her. 12/08/18 on evaluation today patient appears to be doing about the same at this point in regard to her right lower extremity. There is some more palatable to touch I'm concerned about the possibility of there being some infection although I think the main issue is she's not keeping her compression wrap on which in turn is not allowing this area to heal appropriately. 12/22/18 on evaluation today patient appears to be doing better in regard to left lower extremity unfortunately significantly worse in regard to the right lower extremity. The areas of blistering and necrotic superficial tissue have spread and again this does not really appear to be signs of infection and all she just doesn't seem to be doing nearly as well is what she has been in the past. Overall I feel like the Augmentin did absolutely nothing for her she doesn't seem to have any infection again I really didn't think so last time either is more of a potential preventative measure and hoping that this would make some difference but I think the main issue is she's not wearing her compression. She tells me she cannot wear the [place] been we put on she takes it off pretty much upon getting home. Subsequently she worshiped Juxta-Lite when I questioned her about how often she wears it this is no  more than three hours a day obviously that leaves 21 hours that she has no compression and this is obviously not doing well for her. Overall I'm concerned that if things continue to worsen she is at great risk of both infection as well as losing her leg. 01/05/19 on evaluation today patient appears to be doing well in regard to her left lower extremity which he is allowing [place] to wrap and not so well with regard to her right lower extremity which she is not allowing [place] to really wrap and keep the wrap on. She states that it hurts too badly whenever it's wrapped and she ends up  having to take it off. She's been using the Juxta-Lite she tells me up to six hours a day although I question whether or not that's really been the case to be honest. Previously she told me three hours today nonetheless obviously the legs as long as the wrap is doing great when she is not is doing much more poorly. 01/12/2019 on evaluation today patient actually appears to be doing a little better in my opinion with regard to her right lower extremity ulcer. She has a small open area on the left lower extremity unfortunately but again this I think is part of the normal fluctuation of what she is going to have to expect with regard to her legs especially when she is not using her lymphedema pumps on a regular basis. Subsequently based on what I am seeing today I think that she does seem to be doing slightly better with regard to her right lower extremity she did see her primary care provider on Monday they felt she had an infection and placed her on 2 antibiotics. Both Cipro and clindamycin. Subsequently again she seems possibly to be doing a little bit better in regards to the right lower extremity she also tells me however she has been wearing the compression wrap over the past week since I spoke with her as well that is a Kerlix and Coban wrap on the right. No fevers, chills, nausea, vomiting, or diarrhea. 01/19/2019 on evaluation today patient appears to be doing better with regard to her bilateral lower extremities especially the right. I feel like the compression has been beneficial for her which is great news. She did get a call from her primary care provider on her way here today telling her that she did have methicillin- resistant Staphylococcus aureus and he was calling in a couple new antibiotics for her including a ointment to be applied she tells me 3 times a day. With that being said this sounds like likely to be Bactroban which I think could be applied with each dressing/wrap change but I  would not be able to accommodate her applying this 3 times a day. She is in agreement with the least doing this we will add that to her orders today. 01/26/2019 on evaluation today patient actually appears to be doing much better with regard to her right lower extremity. Her left lower extremity is also doing quite well all things considering. Fortunately there is no evidence of active infection at this time. No fevers, chills, nausea, vomiting, or diarrhea. 02/02/2019 on evaluation today patient appears to be doing much better compared to her last evaluation. Little by little off like her right leg is returning more towards normal. There does not appear to be any signs of active infection and overall she seems to be doing quite well which is great news. I am very pleased  in this regard. No fevers, chills, nausea, vomiting, or diarrhea. 02/09/2019 upon evaluation today patient appears to be doing better with regard to her bilateral lower extremities. She has been tolerating the dressing changes without complication. Fortunately there is no signs of active infection at this time. No fevers, chills, nausea, vomiting, or diarrhea. 02/23/2019 on evaluation today patient actually appears to be doing quite well with regard to her bilateral lower extremities. She has been tolerating the dressing changes without complication. She is even used her pumps one time and states that she really felt like it felt good. With that being said she seems to be in good spirits and her legs appear to be doing excellent. 03/09/2019 on evaluation today patient appears to be doing well with regard to her right lower extremity there are no open wounds at this time she is having some discomfort but I feel like this is more neuropathy than anything. With regard to her left lower extremity she had several areas scattered around that she does have some weeping and drainage from but again overall she does not appear to be having any  significant issues and no evidence of infection at this time which is good news. 03/23/2019 on evaluation today patient appears to be doing well with regard to her right lower extremity which she tells me is still close she is using her juxta light here. Her left lower extremity she mainly just has an area on the foot which is still slightly draining although this also is doing great. Overall very pleased at this time. 04/06/2019 patient appears to be doing a little bit worse in regard to her left lower extremity upon evaluation today. She feels like this could be becoming infected again which she had issues with previous. Fortunately there is no signs of systemic infection but again this is always a struggle with her with her legs she will go from doing well to not so well in a very short amount of time. 04/20/2019 on evaluation today patient actually appears to be doing quite well with regard to her right lower extremity I do not see any signs of active infection at this time. Fortunately there is no fever chills noted. She is still taking the antibiotics which I prescribed for her at this point. In regard to the left lower extremity I do feel like some of these areas are better although again she still is having weeping from several locations at this time. 04/27/2019 on evaluation today patient appears to be doing about the same if not slightly worse in regard to her left lower extremity ulcers. She tells me when questioned that she has been sleeping in her Hoveround chair in fact she tells me she falls asleep without even knowing it. I think she is spending a whole lot of time in the chair and less time walking and moving around which is not good for her legs either. On top of that she is in a seated position which is also the worst position she is not really elevating her legs and she is also not using her lymphedema pumps. All this is good to contribute to worsening of her condition in  general. 05/18/2019 on evaluation today patient appears to be doing well with regard to her lower extremity on the right in fact this is showing no signs of any open wounds at this time. On the left she is continuing to have issues with areas that do drain. Some of the regions have healed and  there are couple areas that have reopened. She did go to the ER per the patient according to recommendations from the home health nurse due to what she was seen when she came out on 05/13/2019. Subsequently she felt like the patient needed to go to the hospital due to the fact that again she was having "milky white discharge" from her leg. Nonetheless she had and then was placed on doxycycline and subsequently seems to be doing better. 06/01/2019 upon evaluation today patient appears to be doing really in my opinion about the same. I do not see any signs of active infection which is good news. Overall she still has wounds over the bilateral lower extremities she has reopened on the right but this appears to be more of a crack where there is weeping/edema coming from the region. I do not see any evidence of infection at either site based on what I visualized today. 07/13/2019 upon evaluation today patient appears to be doing a little worse compared to last time I saw her. She since has been in the hospital from 06/21/2019 through 06/29/2019. This was secondary to having Covid. During that time they did apply lotion to her legs which unfortunately has caused her to develop a myriad of open wounds on her lower extremities. Her legs do appear to be doing better as far as the overall appearance is concerned but nonetheless she does have more open and weeping areas. 07/27/2019 upon evaluation today patient appears to be doing more poorly to be honest in regard to her left lower extremity in particular. There is no signs of systemic infection although I do believe she may have local infection. She notes she has been having  a lot of blue/green drainage which is consistent potentially with Pseudomonas. That may be something that we need to consider here as well. The doxycycline does not seem to have been helping. 08/03/2019 upon evaluation today patient appears to be doing a little better in my opinion compared to last week's evaluation. Her culture I did review today and she is on appropriate medications to help treat the Enterobacter that was noted. Overall I feel like that is good news. With that being said she is unfortunately continuing to have a lot of drainage and though it is doing better I still think she has a long ways to go to get things dried up in general. Fortunately there is no signs of systemic infection. 08/10/2019 upon evaluation today patient appears to be doing may be slightly better in regard to her left lower extremity the right lower extremity is doing much better. Fortunately there is no signs of infection right now which is good news. No fevers, chills, nausea, vomiting, or diarrhea. 08/24/2019 on evaluation today patient appears to be doing slightly better in regard to her lower extremities. The left lower extremity seems to be healed the right lower extremity is doing better though not completely healed as far as the openings are concerned. She has some generalized issues here with edema and weeping secondary to her lymphedema though again I do believe this is little bit drier compared to prior weeks evaluations. In general I am very pleased with how things seem to be progressing. No fevers, chills, nausea, vomiting, or diarrhea. 08/31/2019 upon evaluation today patient actually seems to making some progress here with regard to the left lower extremity in particular. She has been tolerating the dressing changes without complication. Fortunately there is no signs of active infection at this time. No fevers,  chills, nausea, vomiting, or diarrhea. She did see Dr. Doren Custard and he did note that she did have  a issue with the left great saphenous vein and the small saphenous vein in the leg. With that being said he was concerned about the possibility of laser ablation not being extremely successful. He also mentioned a small risk of DVT associated with the procedure. However if the wounds do not continue to improve he stated that that would probably be the way to go. Fortunately the patient's legs do seem to be doing much better. 09/07/2019 upon evaluation today patient appears to be doing better with regard to her lower extremities. She has been tolerating the dressing changes without complication. With that being said she is showing signs of improvement and overall very pleased. There are some areas on her leg that I think we do need to debride we discussed this last week the patient is in agreement with doing that as long as it does not hurt too badly. 09/14/2019 upon evaluation today patient appears to be doing decently well with regard to her left lower extremity. She is not having near as much weeping as she has had in the past things seem to be drying up which is good news. There is no signs of active infection at this time. 09/21/19 upon evaluation today patient appears to be doing better in regard overall to her bilateral lower extremities. She again has less open than she did previous and each week I feel like this is getting better. Fortunately there is no signs of active infection at this time. No fevers, chills, nausea, vomiting, or diarrhea. 09/28/2019 upon evaluation today patient actually appears to be showing signs of improvement with regard to her left lower extremity. Unfortunately the right medial lower extremity around the ankle region has reopened to some degree but this appears to be minimal still which is good news. There is no signs of active infection at this time which is also good news. 10/12/2019 upon evaluation today patient appears to be doing okay with regard to her bilateral  lower extremities today. The right is a little bit worse then last evaluation 2 weeks ago. The left is actually doing a little better in my opinion. Overall there is no signs of active infection at this time that I see. Obviously that something we have to keep a close eye on she is very prone to this with the significant and multiple openings that she has over the bilateral lower extremities. 10/19/2019 upon evaluation today patient appears to be doing about the best that I have seen her in quite some time. She has been tolerating the dressing changes without complication. There does not appear to be any signs of active infection and overall I am extremely happy with the way her legs appeared. She is drying up quite nicely and overall is having less pain. 11/09/2019 upon evaluation today patient appears to be doing better in regard to her wounds. She seems to be drying up more and more each time I see her this is just taking a very long time. Fortunately there is no signs of active infection at this time. 11/23/2019 upon evaluation today patient actually appears to be doing excellent in regard to her lower extremities at this point compared to where she has been. Fortunately there is no signs of active infection at this time. She did go to the hospital last week for nausea and vomiting completely unrelated to her wounds. Fortunately she is doing better  she was given some Reglan and got better. She had associated abdominal pain but they never found out what was going on. 12/07/2019 upon evaluation today patient appears to be doing well for the most part in regard to her legs. She unfortunately has not been keeping the Coban portion of her wraps on therefore the compression has not really been sufficient for what it is supposed to be. Nonetheless she tells me that it just hurt too bad therefore she removed it. 12/21/2019 upon evaluation today patient actually appears to be doing quite well with regard to her  legs. I do feel like she has been making progress which is great news and overall there is no signs of active infection at this time. No fevers, chills, nausea, vomiting, or diarrhea. 01/04/2020 upon evaluation today patient presents for follow-up concerning her lower extremity edema bilaterally. She still has open wounds she has not been using her lymphedema pumps. She is also not been utilizing her compression wraps appropriately she tends to unwrap them, take them off, or states that they hurt. Obviously the reason they hurt is because her legs start to swell but the issue is if she would use her compression/lymphedema pumps regularly she would not swell and she would have the pain. Nonetheless she has not even picked them up once honestly over the past several months and may be even as much as in the past year based on my opinion and what have seen. She tells me today that after last week when I talked about this with her specifically actually that was 2 weeks ago that she "forgot". 8//21 on evaluation today patient appears to be doing a little better in regard to her legs bilaterally. Fortunately there is no signs of active infection at this time. She tells me that she used her lymphedema pumps all of one time over the past 2 weeks since I last saw her. She tells me that she has been too busy in order to continue to use these. 02/01/2020 on evaluation today patient appears to be doing some better in regard to her wounds in general in her legs. We felt the right was healed although is not completely it does appear to be doing better she tells me she has been using her lymphedema pumps that she has had this six times since I last saw her. Obviously the more she does that the better she would do my opinion 02/15/2020 upon evaluation today patient appears to be doing about the same in regard to her legs. She tells me that she is pumping I'm still not sure how much she does to be perfectly honest. However  even if she does a little bit here and there I guess that is better than nothing. Fortunately there is no sign of active infection at this time which is great news. No fevers, chills, nausea, vomiting, or diarrhea. 02/29/2020 on evaluation today patient actually appears to be doing quite well all things considered this week. She has been tolerating the dressing changes without complication. Fortunately there is no signs of active infection at this time. No fevers, chills, nausea, vomiting, or diarrhea. Electronic Signature(s) Signed: 02/29/2020 1:47:50 PM By: [name] PA-C Entered By: [name] on 02/29/2020 13:47:50 -------------------------------------------------------------------------------- Physical Exam Details Patient Name: Date of Service: DRUE, CAMERA 02/29/2020 10:45 A M Medical Record Number: [id number] Patient Account Number: [id number] Date of Birth/Sex: Treating RN: [birth year] (69 y.o. [name] Primary Care Provider: [name] Other Clinician: Referring  Provider: Treating Provider/Extender: Doyle Askew, FRED Weeks in Treatment: 51 Constitutional Well-nourished and well-hydrated in no acute distress. Respiratory normal breathing without difficulty. Psychiatric this patient is able to make decisions and demonstrates good insight into disease process. Alert and Oriented x 3. pleasant and cooperative. Notes Patient's wounds currently are showing signs of improvement I still do not know how much she is actually using her lymphedema pumps she also tells me she has been placed on medications for depression at this point. Electronic Signature(s) Signed: 02/29/2020 1:48:08 PM By: [name] PA-C Entered By: [name] on 02/29/2020 13:48:08 -------------------------------------------------------------------------------- Physician Orders Details Patient Name: Date of Service: [name]. 02/29/2020 10:45 A M Medical Record  Number: [id number] Patient Account Number: [id number] Date of Birth/Sex: Treating RN: 1951-02-28 (69 y.o. [name] Primary Care Provider: [name] Other Clinician: Referring Provider: Treating Provider/Extender: Doyle Askew, FRED Weeks in Treatment: [phone] Verbal / Phone Orders: No Diagnosis Coding ICD-10 Coding Code Description E11.622 Type 2 diabetes mellitus with other skin ulcer I89.0 Lymphedema, not elsewhere classified I87.331 Chronic venous hypertension (idiopathic) with ulcer and inflammation of right lower extremity I87.332 Chronic venous hypertension (idiopathic) with ulcer and inflammation of left lower extremity L97.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 leg with fat layer exposed L97.822 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 Dressing Change Frequency Wound #61 Left,Circumferential Lower Leg Change dressing three times week. Wound #64 Left,Dorsal Foot Change dressing three times week. Wound #65 Right,Medial Lower Leg Change dressing three times week. Skin Barriers/Peri-Wound Care Moisturizing lotion - to dry skin Other: - discontinue barrier cream (zinc paste) Wound Cleansing Clean wound with Wound Cleanser - wash legs with soap and water with dressing changes May shower with protection. Primary Wound Dressing Wound #61 Left,Circumferential Lower Leg Calcium Alginate with Silver Wound #64 Left,Dorsal Foot Calcium Alginate with Silver Wound #65 Right,Medial Lower Leg Calcium Alginate with Silver Secondary Dressing Wound #61 Left,Circumferential Lower Leg Dry Gauze ABD pad - as needed Wound #64  Left,Dorsal Foot Dry Gauze ABD pad - as needed Wound #65 Right,Medial Lower Leg Dry Gauze Edema Control 3 Layer Compression System - Bilateral void standing for long periods of time - walking is encouraged A Elevate legs to the level of the heart or above for 30 minutes daily and/or when sitting, a frequency of: - do not sleep in chair with feet dangling, MUST elevate legs while sitting Exercise regularly Segmental Compressive Device. - lymphedema pumps 60 minutes 1- 2 times per day Off-Loading Turn and reposition every 2 hours Additional Orders / Instructions Follow Nutritious Diet - T include vitamin A, vitamin C, and Zinc along with increased protein intake. o [facility] for wound care. - [facility] Electronic Signature(s) Signed: 02/29/2020 6:07:28 PM By: [name] RN, BSN Signed: 02/29/2020 6:26:36 PM By: [name] PA-C Entered By: [name] on 02/29/2020 12:27:16 -------------------------------------------------------------------------------- Problem List Details Patient Name: Date of Service: [name]. 02/29/2020 10:45 A M Medical Record Number: [id number] Patient Account Number: [id number] Date of Birth/Sex: Treating RN: 15-Sep-1950 (69 y.o. [name] Primary Care Provider: [name] Other Clinician: Referring Provider: Treating Provider/Extender: Doyle Askew,  FRED Weeks in Treatment: 134 Active Problems ICD-10 Encounter Code Description Active Date MDM Diagnosis E11.622 Type 2 diabetes mellitus with other skin ulcer 08/05/2017 No Yes I89.0 Lymphedema, not elsewhere classified 08/05/2017 No Yes I87.331 Chronic venous hypertension (idiopathic) with ulcer and inflammation of right 08/05/2017 No Yes lower extremity I87.332 Chronic venous hypertension (idiopathic) with ulcer and inflammation of left 08/05/2017 No Yes lower extremity L97.812 Non-pressure chronic ulcer of other part of right  lower leg with fat layer 08/05/2017 No Yes exposed L97.822 Non-pressure chronic ulcer of other part of left lower leg with fat layer exposed2/20/2019 No Yes L97.522 Non-pressure chronic ulcer of other part of left foot with fat layer exposed 06/01/2019 No Yes I10 Essential (primary) hypertension 08/05/2017 No Yes E66.01 Morbid (severe) obesity due to excess calories 08/05/2017 No Yes F41.8 Other specified anxiety disorders 08/05/2017 No Yes R53.1 Weakness 08/05/2017 No Yes Inactive Problems Resolved Problems Electronic Signature(s) Signed: 02/29/2020 11:02:43 AM By: [name] PA-C Entered By: [name] on 02/29/2020 11:02:42 -------------------------------------------------------------------------------- Progress Note Details Patient Name: Date of Service: [name]. 02/29/2020 10:45 A M Medical Record Number: [id number] Patient Account Number: [id number] Date of Birth/Sex: Treating RN: 01-Dec-1950 (69 y.o. [name] Primary Care Provider: [name] Other Clinician: Referring Provider: Treating Provider/Extender: Doyle Askew, FRED Weeks in Treatment: [phone] Subjective Chief Complaint Information obtained from Patient Bilateral reoccurring LE ulcers History of Present Illness (HPI) this patient has been seen a couple of times before and returns with recurrent problems to her right and left lower extremity with swelling and weeping ulcerations due to not wearing her compression stockings which she had been advised to do during her last discharge, at the end of June 2018. During her last visit the patient had had normal arterial blood flow and her venous reflux study did not necessitate any surgical intervention. She was recommended compression and elevation and wound care. After prolonged treatment the patient was completely healed but she has been noncompliant with wearing or compressions.. She was here last week with an outpatient return visit planned  but the patient came in a very poor general condition with altered mental status and was rushed to the ER on my request. With a history of hypertension, diabetes, TIA and right-sided weakness she was set up for an MRI on her brain and cervical spine and was sent to [facility]. Getting an MRI done was very difficult but once the workup was done she was found not to have any spinal stenosis, epidural abscess or hematoma or discitis. This was radiculopathy to be treated as an outpatient and she was given a follow-up appointment. Today she is feeling much better alert and oriented and has come to reevaluate her bilateral lower extremity lymphedema and ulceration 03/25/2017 -- she was admitted to the hospital on 03/16/2017 and discharged on 03/18/2017 with left leg cellulitis and ulceration. She was started on vancomycin and Zosyn and x-ray showed no bony involvement. She was treated for a cellulitis with IV antibiotics changed to Rocephin and Flagyl and was discharged on oral Keflex and doxycycline to complete a 7 day course. Last hemoglobin A1c was 7.1 and her other ailments including hypertension got asthma were appropriately treated. 05/06/2017 -- she is awaiting the right size of compression stockings from [employer] but other than that has been doing well. ====== Old notes 69 year old patient was seen one time last October and was lost to follow-up. She has recurrent problems with weeping and ulceration of her left lower  extremity and has swelling of this for several years. It has been worse for the last 2 months. Past medical history is significant for diabetes mellitus type 2, hypertension, gout, morbid obesity, depressive disorders, hiatal hernia, migraines, status post knee surgery, risk of a cholecystectomy, vaginal hysterectomy and breast biopsy. She is not a smoker. As noted before she has never had a venous duplex study and an arterial ABI study was attempted but the left lower extremity was  noncompressible 10/01/2016 -- had a lower extremity venous duplex reflux evaluation which showed no evidence of deep vein reflux in the right or left lower extremity, and no evidence of great saphenous vein reflux more than 500 ms in the right or left lower extremity, and the left small saphenous vein is incompetent but no vascular consult was recommended. review of her electronic medical records noted that the ABI was checked in July 2017 where the right ABI was normal limits and the left ABI could not be ascertained due to pain with cuff pressure but the waveforms are within normal limits. her arterial duplex study scheduled for April 27. 10/08/2016 -- the patient has various reasons for not having a compression on and for the last 3 days she has had no compression on her left lower extremity either due to pain or the lack of nursing help. She does not use her juxta lites either. 10/15/2016 -- the patient did not keep her appointment for arterial duplex study on April 27 and I have asked her to reschedule this. Her pain is out of proportion with the physical findings and she continuously fails to wear a compression wraps and cuts them off because she says she cannot tolerate the pain. She does not use her juxta lites either. 10/22/2016 -- he has rescheduled her arterial duplex study to May 21 and her pain today is a bit better. She has not been wearing her juxta lites on her right lower extremity but now understands that she needs to do this. She did tolerate the to press compression wrap on her left lower extremity 10/29/2016 --arterial duplex study is scheduled for next week and overall she has been tolerating her compression wraps and also using her juxta lites on her right lower extremity 11/05/2016 -- the right ABI was 0.95 the left was 1.03. The digit TBI is on the right was 0.83 on the left was 0.92 and she had biphasic flow through these vessels. The impression was that of normal lower  extremity arterial study. 11/12/2016 -- her pain is minimal and she is doing very well overall. 11/26/2016 -- she has got juxta lites and her insurance will not pay for additional dual layer compression stockings. She is going to order some from [employer]. 05/12/2017 -- her juxta lites are very old and too big for her and these have not been helping with compression. She did get 20-30 mm compression stockings from [employer] but she and her husband are unable to put these on. I believe she will benefit from bilateral Extremit-ease, compression stockings and we will measure her for these today. 05/20/2017 -- lymphedema on the left lower extremity has increased a lot and she has a open ulceration as a result of this. The right lower extremity is looking pretty good. She has decided to by the compression stockings herself and will get reimbursed by the home health, at a later date. 05/27/2017 -- her sciatica is bothering her a lot and she thought her left leg pain was caused due to the  compression wrap and hence removed it and has significant lymphedema. There is no inflammation on this left lower extremity. 06/17/17 on evaluation today patient appears to be doing very well and in fact is completely healed in regard to her ulcerations. Unfortunately however she does have continued issues with lymphedema nonetheless. We did order compression garments for her unfortunately she states that the size that she received were large although we ordered medium. Obviously this means she is not getting the optimal compression. She does not have those with her today and therefore we could not confirm and contact the company on her behalf. Nonetheless she does state that she is going to have her husband bring them by tomorrow so that we can verify and then get in touch with the company. No fevers, chills, nausea, or vomiting noted at this time. Overall patient is doing better otherwise and I'm pleased with the progress she  has made. 07/01/17 on evaluation today patient appears to be doing very well in regard to her bilateral lower extremity she does not have any openings at this point which is excellent news. Overall I'm pleased with how things have progressed up to this time. Since she is doing so well we did order her compression which we are seeing her today to ensure that it fits her properly and everything is doing well in that regard and then subsequently she will be discharged. ============ Old Notes: 03/31/16 patient presents today for evaluation concerning open wounds that she has over the left medial ankle region as well as the left dorsal foot. She has previously had this occur although it has been healed for a number of months after having this for about a year prior until her hospitalization on 01/05/16. At that point in time it appears that she was admitted to the hospital for left lower extremity cellulitis and was placed on vancomycin and Zosyn at that point. Eventually upon discharge on January 15, 2016 she was placed on doxycycline at that point in time. Later on 03/27/16 positive wound culture growing Escherichia coli this was switched to amoxicillin. Currently she tells me that she is having pain radiated to be a 7 out of 10 which can be as high as 10 out of 10 with palpation and manipulation of the wound. This wound appears to be mainly venous in nature due to the bilateral lower extremity venous stasis/lymphedema. This is definitely much worse on her left than the right side. She does have type 1 diabetes mellitus, hypertension, morbid obesity, and is wheelchair dependent.during the course of the hospital stay a blood culture was also obtained and fortunately appeared negative. She also had an x-ray of the tibia/fibula on the left which showed no acute bone abnormality. Her white blood cell count which was performed last on 03/25/16 was 7.3, hemoglobin 12.8, protein 7.1, albumin 3.0. Her urine culture  appeared to be negative for any specific organisms. Patient did have a left lower extremity venous duplex evaluation for DVT . This did not include venous reflux studies but fortunately was negative for DVT Patient also had arterial studies performed which revealed that she had a . normal ABI on the right though this was unable to be performed on the left secondary to pain that she was having around the ankle region due to the wound. However it was stated on report that she had biphasic pulses and apparently good blood flow. ========== 06/03/17 she is here in follow-up evaluation for right lower extremity ulcer. The right lower  sure he has healed but she has reopened to the left medial malleolus and dorsal foot with weeping. She is waiting for new compression garments to arrive from home health, the previous compression garments were ill fitting. We will continue with compression bilaterally and follow-up in 2 weeks Readmission: 08/05/17 on evaluation today patient appears to be doing somewhat poorly in regard to her left lower extremity especially although the right lower extremity has a small area which may no longer be open. She has been having a lot of drainage from the left lower extremity however he tells me that she has not been able to use the EXTREMIT-EASE Compression at this point. She states that she did better and was able to actually apply the Juxta-Lite compression although the wound that she has is too large and therefore really does not compress which is why she cannot wear it at this point. She has no one who can help her put it on regular basis her son can sometimes but he's not able to do it most of the time. I do believe that's why she has begun to weave and have issues as she is currently yet again. No fevers, chills, nausea, or vomiting noted at this time. Patient is no evidence of dementia. 08/12/17 on evaluation today patient appears to still be doing fairly well in regard to  the draining areas/weeping areas at this point. With that being said she unfortunately did go to the ER yesterday due to what was felt to be possibly a cellulitis. They place her on doxycycline by mouth and discharge her home. She definitely was not admitted. With that being said she states she has had more discomfort which has been unusual for her even compared to prior times and she's had infections.08/12/17 on evaluation today patient appears to still be doing fairly well in regard to the draining areas/weeping areas at this point. With that being said she unfortunately did go to the ER yesterday due to what was felt to be possibly a cellulitis. They place her on doxycycline by mouth and discharge her home. She definitely was not admitted. With that being said she states she has had more discomfort which has been unusual for her even compared to prior times and she's had infections. 08/19/17 put evaluation today patient tells me that she's been having a lot of what sounds to be neuropathic type pain in regard to her left lower extremity. She has been using over-the-counter topical bins again which some believe. That in order to apply the she actually remove the wrap we put on her last Wednesday on Thursday. Subsequently she has not had anything on compression wise since that time. The good news is a lot of the weeping areas appear to have closed at this point again I believe she would do better with compression but we are struggling to get her to actually use what she needs to at this point. No fevers, chills, nausea, or vomiting noted at this time. 09/03/17 on evaluation today patient appears to be doing okay in regard to her lower extremities in regard to the lymphedema and weeping. Fortunately she does not seem to show any signs of infection at this point she does have a little bit of weeping occurring in the right medial malleolus area. With that being said this does not appear to be too significant  which is good news. 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 She has severe skin  damage secondary to both of these features involving the dorsal left foot and medial left ankle and lower leg. Still has open areas in the left anterior foot. The area on the right closed over. She uses her own juxta light stockings. She does not have an arterial issue 09/16/17 on evaluation today patient actually appears to be doing excellent in regard to her bilateral lower extremity swelling. The Juxta-Lite compression wrap seem to be doing very well for her. She has not however been using the portion that goes over her foot. Her left foot still is draining a little bit not nearly as significant as it has been in the past but still I do believe that she likely needs to utilize the full wrap including the foot portion of this will improve as well. She also has been apparently putting on a significant amount of Vaseline which also think is not helpful for her. I recommended that if she feels she needs something for moisturizer Eucerin will probably be better. 09/30/17 on evaluation today patient presents with several new open areas in regard to her left lower extremity although these appear to be minimal and mainly seem to be more moisture breakdown than anything. Fortunately she does not seem to have any evidence of infection which is great news. She has been tolerating the dressing changes without complication we are using silver alginate on the foot she has been using AB pads to have the legs and using her Juxta- Lite compression which seems to be controlling her swelling very well. Overall I'm pleased with the poor way she has progressed. 10/14/17 on evaluation today patient appears to be doing better in regard to her left lower extremity areas of weeping. She does still have some discomfort although in general this does not appear to be as macerated  and I think it is progressing nicely. I do think she still needs to wear the foot portion of her Juxta- Lite in order to get the most benefit from the wrap obviously. She states she understands. Fortunately there does not appear to be evidence of infection at this time which is great news. 10/28/17 on evaluation today patient appears to be doing excellent in regard to her left lower extremity. She has just a couple areas that are still open and seem to be causing any trouble whatsoever. For that reason I think that she is definitely headed in the right direction the spots are very tiny compared to what we have been dealing with in the past. 11/11/17 on evaluation today patient appears to have a right lateral lower extremity ulcer that has opened since I last saw her. She states this is where the home health nurse that was coming out remove the dressing without wetting the alginate first. Nonetheless I do not know if this is indeed the case or not but more importantly we have not ordered home help to be coming out for her wounds at all. I'm unsure as to why they are coming out and we're gonna have to check on this and get things situated in that regard. With that being said we currently really do not need them to be coming out as the patient has been taking care of her leg herself without complication and no issues. In fact she was doing much better prior to nursing coming out. 11/25/17 on evaluation today patient actually appears to be doing fairly well in regard to her left lower extremity swelling. In fact she has very little area of  weeping at this point there's just a small spot on the lateral portion of her right leg that still has me just a little bit more concerned as far as wanting to see this clear up before I discharge her to caring for this at home. Nonetheless overall she has made excellent progress. 12/09/17 on evaluation today patient appears to be doing rather well in regard to her lower  extremity edema. She does have some weeping still in the left lower extremity although the big area we were taking care of two weeks ago actually has closed and she has another area of weeping on the left lower extremity immediately as well is the top of her foot. She does not currently have lymphedema pumps she has been wearing her compression daily on a regular basis as directed. With that being said I think she may benefit from lymphedema pumps. 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 We had a very difficult time getting and keeping this under control. 12/23/17 on evaluation today patient actually appears to be doing a little bit more poorly in regard to her bilateral lower extremities. She has been tolerating the Juxta-Lite compression wraps. Unfortunately she has two new ulcers on the right lower extremity and left lower Trinity ulceration seems to be larger. Obviously this is not good news. She has been tolerating the dressings without complication. 12/30/17 on evaluation today patient actually appears to be doing much better in regard to her bilateral lower extremity edema. She continues to have some issues with ulcerations and in fact there appears to be one spot on each leg where the wrap may have caused a little bit of a blister which is subsequently opened up at this point is given her pain. Fortunately it does not appear to be any evidence of infection which is good news. No fevers chills noted. 01/13/18 on evaluation today patient appears to be doing rather well in regard to her bilateral lower extremities. The dressings did get kind of stuck as far as the wound beds are concerned but again I think this is mainly due to the fact that she actually seems to be showing signs of healing which is good news. She's not having as much drainage  therefore she was having more of the dressing sticking. Nonetheless overall I feel like her swelling is dramatically down compared to previous. 01/20/18 on evaluation today patient unfortunately though she's doing better in most regards has a large blister on the left anterior lower extremity where she is draining quite significantly. Subsequently this is going to need debridement today in order to see what's underneath and ensure she does not continue to trapping fluid at this location. Nonetheless No fevers, chills, nausea, or vomiting noted at this time. 01/27/18 on evaluation today patient appears to be doing rather well at this point in regard to her right lower extremity there's just a very small area that she still has open at this point. With that being said I do believe that she is tolerating the compression wraps very well in making good progress. Home health is coming out at this point to see her. Her left lower extremity on the lateral portion is actually what still mainly open and causing her some discomfort for the most part 02/10/18 on evaluation today patient actually appears to be doing very well in regard  to her right lower extremity were all the ulcers appear to be completely close. In regard to the left lower extremity she does have two areas still open and some leaking from the dorsal surface of her foot but this still seems to be doing much better to me in general. 02/24/18 on evaluation today patient actually appears to be doing much better in regard to her right lower extremity this is still completely healed. Her left lower extremity is also doing much better fortunately she has no evidence of infection. The one area that is gonna require some debridement is still on the left anterior shin. Fortunately this is not hurting her as badly today. 03/10/18 on evaluation today patient appears to be doing better in some regards although she has a little bit more open area on the dorsal foot  and she also has some issues on the medial portion of the left lower extremity which is actually new and somewhat deep. With that being said there fortunately does not appear to be any significant signs of infection which is good news. No fevers, chills, nausea, or vomiting noted at this time. In general her swelling seems to be doing fairly well which is good news. 03/31/18 on evaluation today patient presents for follow-up concerning her left lower extremity lymphedema. Unfortunately she has been doing a little bit more poorly since I last saw her in regard to the amount of weeping that she is experiencing. She's also having some increased pain in the anterior shin location. Unfortunately I do not feel like the patient is making such good progress at this point a few weeks back she was definitely doing much better. 04/07/18 on evaluation today patient actually appears to be showing some signs of improvement as far as the left lower extremity is concerned. She has been tolerating the dressing changes and it does appear that the Drawtex did better for her. With that being said unfortunately home health is stating that they cannot obtain the Drawtex going forward. Nonetheless we're gonna have to check and see what they may be able to get the alginate they were using was getting stuck in causing new areas of skin being pulled all that with and subsequently weep and calls her to worsen overall this is the first time we've seen improvement at this time. 04/14/18 on evaluation today patient actually appears to be doing rather well at this point there does not appear to be any evidence of infection at this time and she is actually doing excellent in regard to the weeping in fact she almost has no openings remaining even compared to just last week this is a dramatic improvement. No fevers chills noted 04/21/18 evaluation today patient actually appears to be doing very well. She in fact is has a small area on  the posterior lower extremity location and she has a small area on the dorsal surface of her foot that are still open both of which are very close to closing. We're hoping this will be close shortly. She brought her Juxta-Lite wrap with her today hoping that would be able to put her in it unfortunately I don't think were quite at that point yet but we're getting closer. 04/28/18 upon evaluation today patient actually appears to be doing excellent in regard to her left lower extremity ulcer. In fact the region on the posterior lower extremity actually is much smaller than previously noted. Overall I'm very happy with the progress she has made. She again did bring her  Juxta-Lite although we're not quite ready for that yet. 05/11/18 upon evaluation today patient actually appears to be doing in general fairly well in regard to her left lower Trinity. The swelling is very well controlled. With that being said she has a new area on the left anterior lower extremity as well as between the first and second toes of her left foot that was not present during the last evaluation. The region of her posterior left lower extremity actually appears to be almost completely healed. T be honest I'm very pleased with o the way that stands. Nonetheless I do believe that the lotion may be keeping the area to moist as far as her legs are concerned subsequently I'm gonna consider discontinuing that today. 05/26/18 on evaluation today patient appears to be doing rather well in regard to her left lower should be ulcers. In fact everything appears to be close except for a very small area on the left posterior lower extremity. Fortunately there does not appear to be any evidence of infection at this time. Overall very pleased with her progress. 06/02/18 and evaluation today patient actually appears to be doing very well in regard to her lower extremity ulcers. She has one small area that still continues to weep that I think may  benefit her being able to justify lotion and user Juxta-Lite wraps versus continued to wrap her. Nonetheless I think this is something we can definitely look into at this point. 06/23/18 on evaluation today patient unfortunately has openings of her bilateral lower extremities. In general she seems to be doing much worse than when I last saw her just as far as her overall health standpoint is concerned. She states that her discomfort is mainly due to neuropathy she's not having any other issues otherwise. No fevers, chills, nausea, or vomiting noted at this time. 06/30/18 on evaluation today patient actually appears to be doing a little worse in regard to her right lower extremity her left lower extremity of doing fairly well. Fortunately there is no sign of infection at this time. She has been tolerating the dressing changes without complication. Home health did not come out like they were supposed to for the appropriate wrap changes. They stated that they never received the orders from [place] which were fax. Nonetheless we will send a copy of the orders with the patient today as well. 07/07/18 on evaluation today patient appears to be doing much better in regard to lower extremities. She still has several openings bilaterally although since I last saw her her legs did show obvious signs of infection when she later saw her nurse. Subsequently a culture was obtained and she is been placed on Bactrim and Keflex. Fortunately things seem to be looking much better it does appear she likely had an infection. Again last week we'd even discussed it but again there really was not any obvious sign that she had infection therefore we held off on the antibiotics. Nonetheless I'm glad she's doing better today. 07/14/18 on evaluation today patient appears to be doing much better regarding her bilateral lower Trinity's. In fact on the right lower for me there's nothing open at this point there are some dry skin areas at the  sites where she had infection. Fortunately there is no evidence of systemic infection which is excellent news. No fevers chills noted 07/21/18 on evaluation today patient actually appears to be doing much better in regard to her left lower extremity ulcers. She is making good progress and overall I  feel like she's improving each time I see her. She's having no pain I do feel like the infection is completely resolved which is excellent news. No fevers, chills, nausea, or vomiting noted at this time. 07/28/18 on evaluation today patient appears to be doing very well in regard to her left lower [employer]. Everything seems to be showing signs of improvement which is excellent news. Overall very pleased with the progress that has been made. Fortunately there's no evidence of active infection at this time also excellent news. 08/04/18 on evaluation today patient appears to be doing more poorly in regard to her bilateral lower extremities. She has two new areas open up on the right and these were completely closed as of last week. She still has the two spots on the left which in my pinion seem to be doing better. Fortunately there's no evidence of infection again at this point. 08/11/18 on evaluation today patient actually appears to be doing very well in regard to her bilateral lower Trinity wounds that all seem to be doing better and are measures smaller today. Fortunately there's no signs of infection. No fevers, chills, nausea, or vomiting noted at this time. 08/18/18 on evaluation today patient actually appears to be doing about the same inverter bilateral lower extremities. She continues to have areas that blistering open as was drain that fortunately nothing too significant. Overall I feel like Drawtex may have done better for her however compared to the collagen. 08/25/18 on evaluation today patient appears to be doing a little bit more poorly today even compared to last time I saw her. Again I'm not  exactly sure why she's making worse progress over the past several weeks. I'm beginning to wonder if there is some kind of underlying low level infection causing this issue. I did actually take a culture from the left anterior lower extremity but it was a new wound draining quite a bit at this point. Unfortunately she also seems to be having more pain which is what also makes me worried about the possibility of infection. This is despite never erythema noted at this point. 09/01/18 on evaluation today patient actually appears to be doing a little worse even compared to last week in regard to bilateral lower extremities. She did go to the hospital on the 16th was given a dose of IV Zosyn and then discharged with a recommendation to continue with the Bactrim that I previously prescribed for her. Nonetheless she is still having a lot of discomfort she tells me as well at this time. This is definitely unfortunate. No fevers, chills, nausea, or vomiting noted at this time. 09/08/18 on evaluation today patient's bilateral lower extremities actually appear to be shown signs of improvement which is good news. Fortunately there does not appear to be any signs of active infection I think the anabiotic is helping in this regard. Overall I'm very pleased with how she is progressing. 09/15/18 patient was actually seen in ER yesterday due to her legs as well unfortunately. She states she's been having a lot of pain and discomfort as well as a lot of drainage. Upon inspection today the patient does have a lot of swelling and drainage I feel like this is more related to lymphedema and poor fluid control than it is to infection based on what I'm seeing. The physician in the emergency department also doubted that the patient was having a significant infection nonetheless I see no evidence of infection obvious at this point although I do  see evidence of poor fluid control. She still not using a compression pumps, she is not  elevating due to her lift chair as well as her hospital bed being broken, and she really is not keeping her legs up as much as they should be and also has been taking off her wraps. All this combined I think has led to poor fluid control and to be honest she may be somewhat volume overloaded in general as well. I recommend that she may need to contact your physician to see if a prescription for a diuretic would be beneficial in their opinion. As long as this is safe I think it would likely help her. 09/29/18 on evaluation today patient's left lower extremity actually appears to be doing quite a bit better. At least compared to last time that I saw her. She still has a large area where she is draining from but there's a lot of new skin speckled trout and in fact there's more new skin that there are open areas of weeping and drainage at this point. This is good news. With regard to the right lower extremity this is doing much better with the only open area that I really see being a dry spot on the right lateral ankle currently. Fortunately there's no signs of active infection at this time which is good news. No fevers, chills, nausea, or vomiting noted at this time. The patient seems somewhat stressed and overwhelmed during the visit today she was very lethargic as such. She does and she is not taking any pain medications at this point. Apparently according to her husband are also in the process of moving which is probably taking its toll on her as well. 10/06/18 on evaluation today patient appears to be doing rather well in regard to her lower extremities compared to last evaluation. Fortunately there's no signs of active infection. She tells me she did have an appointment with her primary. Nonetheless he was concerned that the wounds were somewhat deep based on pictures but we never actually saw her legs. She states that he had her somewhat worried due to the fact that she was fearing now that she was  [place] have to have an amputation. With that being said based on what I'm seeing check she looks better this week that she has the last two times I've seen her with much less drainage I'm actually pleased in this regard. That doesn't mean that she's out of the water but again I do not think what the point of talking about education at all in regard to her leg. She is very happy to hear this. She is also not having as much pain as she was having last week. 10/13/18 unfortunately on evaluation today patient still continues to have a significant amount of drainage she's not letting home health actually apply the compression dressings at this point. She's trying to use of Juxta-Lite of the top of Kerlex and the second layer of the three layer compression wrap. With that being said she just does not seem to be making as good a progress as I would expect if she was having the compression applied and in place on a regular basis. No fevers, chills, nausea, or vomiting noted at this time. 10/20/18 on evaluation today patient appears to be doing a little better in regard to her bilateral lower extremity ulcers. In fact the right lower extremity seems to be healed she doesn't even have any openings at this point left lower extremity though  still somewhat macerated seems to be showing signs of new skin growth at multiple locations throughout. Fortunately there's no evidence of active infection at this time. No fevers, chills, nausea, or vomiting noted at this time. 10/27/18 on evaluation today patient appears to be doing much better in regard to her left lower Trinity ulcer. She's been tolerating the laptop complication and has minimal drainage noted at this point. Fortunately there's no signs of active infection at this time. No fevers, chills, nausea, or vomiting noted at this time. 11/03/18 on evaluation today patient actually appears to be doing excellent in regard to her left lower extremity. She is having very  little drainage at this point there does not appear to be any significant signs of infection overall very pleased with how things have gone. She is likewise extremely pleased still and seems to be making wonderful progress week to week. I do believe antibiotics were helpful for her. Her primary care provider did place on amateur clean since I last saw her. 11/17/18 on evaluation today patient appears to be doing worse in regard to her bilateral lower extremities at this point. She is been tolerating the dressing changes without complication. With that being said she typically takes the [place] off fairly quickly upon arriving home even after being seen here in the clinic and does not allow home health reapply command as part of the dressing at home. Therefore she said no compression essentially since I last saw her as best I can tell. With that being said I think it shows and how much swelling she has in the open wounds that are noted at this point. Fortunately there's no signs of infection but unfortunately if she doesn't get this under control I think she will end up with infection and more significant issues. 11/24/18 on evaluation today patient actually appears to be doing somewhat better in regard to her bilateral lower extremities. 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 Nonetheless that is doing better currently so she should be able to attempt reinitiating the lymphedema pumps at this time. No fevers, chills, nausea, or vomiting noted at this time. 12/01/18 upon evaluation today patient's left lower extremity appears to be doing quite well unfortunately her right lower extremity is not doing nearly as well. She has been tolerating the dressing changes without complication unfortunately she did not keep a wrap on the right at this time. Nonetheless I  believe this has led to increased swelling and weeping in the world is actually much larger than during the last evaluation with her. 12/08/18 on evaluation today patient appears to be doing about the same at this point in regard to her right lower extremity. There is some more palatable to touch I'm concerned about the possibility of there being some infection although I think the main issue is she's not keeping her compression wrap on which in turn is not allowing this area to heal appropriately. 12/22/18 on evaluation today patient appears to be doing better in regard to left lower extremity unfortunately significantly worse in regard to the right lower extremity. The areas of blistering and necrotic superficial tissue have spread and again this does not really appear to be signs of infection and all she just doesn't seem to be doing nearly as well is what she has been in the  past. Overall I feel like the Augmentin did absolutely nothing for her she doesn't seem to have any infection again I really didn't think so last time either is more of a potential preventative measure and hoping that this would make some difference but I think the main issue is she's not wearing her compression. She tells me she cannot wear the [place] been we put on she takes it off pretty much upon getting home. Subsequently she worshiped Juxta-Lite when I questioned her about how often she wears it this is no more than three hours a day obviously that leaves 21 hours that she has no compression and this is obviously not doing well for her. Overall I'm concerned that if things continue to worsen she is at great risk of both infection as well as losing her leg. 01/05/19 on evaluation today patient appears to be doing well in regard to her left lower extremity which he is allowing [place] to wrap and not so well with regard to her right lower extremity which she is not allowing [place] to really wrap and keep the wrap on. She states that  it hurts too badly whenever it's wrapped and she ends up having to take it off. She's been using the Juxta-Lite she tells me up to six hours a day although I question whether or not that's really been the case to be honest. Previously she told me three hours today nonetheless obviously the legs as long as the wrap is doing great when she is not is doing much more poorly. 01/12/2019 on evaluation today patient actually appears to be doing a little better in my opinion with regard to her right lower extremity ulcer. She has a small open area on the left lower extremity unfortunately but again this I think is part of the normal fluctuation of what she is going to have to expect with regard to her legs especially when she is not using her lymphedema pumps on a regular basis. Subsequently based on what I am seeing today I think that she does seem to be doing slightly better with regard to her right lower extremity she did see her primary care provider on Monday they felt she had an infection and placed her on 2 antibiotics. Both Cipro and clindamycin. Subsequently again she seems possibly to be doing a little bit better in regards to the right lower extremity she also tells me however she has been wearing the compression wrap over the past week since I spoke with her as well that is a Kerlix and Coban wrap on the right. No fevers, chills, nausea, vomiting, or diarrhea. 01/19/2019 on evaluation today patient appears to be doing better with regard to her bilateral lower extremities especially the right. I feel like the compression has been beneficial for her which is great news. She did get a call from her primary care provider on her way here today telling her that she did have methicillin- resistant Staphylococcus aureus and he was calling in a couple new antibiotics for her including a ointment to be applied she tells me 3 times a day. With that being said this sounds like likely to be Bactroban which I think  could be applied with each dressing/wrap change but I would not be able to accommodate her applying this 3 times a day. She is in agreement with the least doing this we will add that to her orders today. 01/26/2019 on evaluation today patient actually appears to be doing much better with  regard to her right lower extremity. Her left lower extremity is also doing quite well all things considering. Fortunately there is no evidence of active infection at this time. No fevers, chills, nausea, vomiting, or diarrhea. 02/02/2019 on evaluation today patient appears to be doing much better compared to her last evaluation. Little by little off like her right leg is returning more towards normal. There does not appear to be any signs of active infection and overall she seems to be doing quite well which is great news. I am very pleased in this regard. No fevers, chills, nausea, vomiting, or diarrhea. 02/09/2019 upon evaluation today patient appears to be doing better with regard to her bilateral lower extremities. She has been tolerating the dressing changes without complication. Fortunately there is no signs of active infection at this time. No fevers, chills, nausea, vomiting, or diarrhea. 02/23/2019 on evaluation today patient actually appears to be doing quite well with regard to her bilateral lower extremities. She has been tolerating the dressing changes without complication. She is even used her pumps one time and states that she really felt like it felt good. With that being said she seems to be in good spirits and her legs appear to be doing excellent. 03/09/2019 on evaluation today patient appears to be doing well with regard to her right lower extremity there are no open wounds at this time she is having some discomfort but I feel like this is more neuropathy than anything. With regard to her left lower extremity she had several areas scattered around that she does have some weeping and drainage from but  again overall she does not appear to be having any significant issues and no evidence of infection at this time which is good news. 03/23/2019 on evaluation today patient appears to be doing well with regard to her right lower extremity which she tells me is still close she is using her juxta light here. Her left lower extremity she mainly just has an area on the foot which is still slightly draining although this also is doing great. Overall very pleased at this time. 04/06/2019 patient appears to be doing a little bit worse in regard to her left lower extremity upon evaluation today. She feels like this could be becoming infected again which she had issues with previous. Fortunately there is no signs of systemic infection but again this is always a struggle with her with her legs she will go from doing well to not so well in a very short amount of time. 04/20/2019 on evaluation today patient actually appears to be doing quite well with regard to her right lower extremity I do not see any signs of active infection at this time. Fortunately there is no fever chills noted. She is still taking the antibiotics which I prescribed for her at this point. In regard to the left lower extremity I do feel like some of these areas are better although again she still is having weeping from several locations at this time. 04/27/2019 on evaluation today patient appears to be doing about the same if not slightly worse in regard to her left lower extremity ulcers. She tells me when questioned that she has been sleeping in her Hoveround chair in fact she tells me she falls asleep without even knowing it. I think she is spending a whole lot of time in the chair and less time walking and moving around which is not good for her legs either. On top of that she is in  a seated position which is also the worst position she is not really elevating her legs and she is also not using her lymphedema pumps. All this is good to  contribute to worsening of her condition in general. 05/18/2019 on evaluation today patient appears to be doing well with regard to her lower extremity on the right in fact this is showing no signs of any open wounds at this time. On the left she is continuing to have issues with areas that do drain. Some of the regions have healed and there are couple areas that have reopened. She did go to the ER per the patient according to recommendations from the home health nurse due to what she was seen when she came out on 05/13/2019. Subsequently she felt like the patient needed to go to the hospital due to the fact that again she was having "milky white discharge" from her leg. Nonetheless she had and then was placed on doxycycline and subsequently seems to be doing better. 06/01/2019 upon evaluation today patient appears to be doing really in my opinion about the same. I do not see any signs of active infection which is good news. Overall she still has wounds over the bilateral lower extremities she has reopened on the right but this appears to be more of a crack where there is weeping/edema coming from the region. I do not see any evidence of infection at either site based on what I visualized today. 07/13/2019 upon evaluation today patient appears to be doing a little worse compared to last time I saw her. She since has been in the hospital from 06/21/2019 through 06/29/2019. This was secondary to having Covid. During that time they did apply lotion to her legs which unfortunately has caused her to develop a myriad of open wounds on her lower extremities. Her legs do appear to be doing better as far as the overall appearance is concerned but nonetheless she does have more open and weeping areas. 07/27/2019 upon evaluation today patient appears to be doing more poorly to be honest in regard to her left lower extremity in particular. There is no signs of systemic infection although I do believe she may have  local infection. She notes she has been having a lot of blue/green drainage which is consistent potentially with Pseudomonas. That may be something that we need to consider here as well. The doxycycline does not seem to have been helping. 08/03/2019 upon evaluation today patient appears to be doing a little better in my opinion compared to last week's evaluation. Her culture I did review today and she is on appropriate medications to help treat the Enterobacter that was noted. Overall I feel like that is good news. With that being said she is unfortunately continuing to have a lot of drainage and though it is doing better I still think she has a long ways to go to get things dried up in general. Fortunately there is no signs of systemic infection. 08/10/2019 upon evaluation today patient appears to be doing may be slightly better in regard to her left lower extremity the right lower extremity is doing much better. Fortunately there is no signs of infection right now which is good news. No fevers, chills, nausea, vomiting, or diarrhea. 08/24/2019 on evaluation today patient appears to be doing slightly better in regard to her lower extremities. The left lower extremity seems to be healed the right lower extremity is doing better though not completely healed as far as the openings are  concerned. She has some generalized issues here with edema and weeping secondary to her lymphedema though again I do believe this is little bit drier compared to prior weeks evaluations. In general I am very pleased with how things seem to be progressing. No fevers, chills, nausea, vomiting, or diarrhea. 08/31/2019 upon evaluation today patient actually seems to making some progress here with regard to the left lower extremity in particular. She has been tolerating the dressing changes without complication. Fortunately there is no signs of active infection at this time. No fevers, chills, nausea, vomiting, or diarrhea. She did  see Dr. Doren Custard and he did note that she did have a issue with the left great saphenous vein and the small saphenous vein in the leg. With that being said he was concerned about the possibility of laser ablation not being extremely successful. He also mentioned a small risk of DVT associated with the procedure. However if the wounds do not continue to improve he stated that that would probably be the way to go. Fortunately the patient's legs do seem to be doing much better. 09/07/2019 upon evaluation today patient appears to be doing better with regard to her lower extremities. She has been tolerating the dressing changes without complication. With that being said she is showing signs of improvement and overall very pleased. There are some areas on her leg that I think we do need to debride we discussed this last week the patient is in agreement with doing that as long as it does not hurt too badly. 09/14/2019 upon evaluation today patient appears to be doing decently well with regard to her left lower extremity. She is not having near as much weeping as she has had in the past things seem to be drying up which is good news. There is no signs of active infection at this time. 09/21/19 upon evaluation today patient appears to be doing better in regard overall to her bilateral lower extremities. She again has less open than she did previous and each week I feel like this is getting better. Fortunately there is no signs of active infection at this time. No fevers, chills, nausea, vomiting, or diarrhea. 09/28/2019 upon evaluation today patient actually appears to be showing signs of improvement with regard to her left lower extremity. Unfortunately the right medial lower extremity around the ankle region has reopened to some degree but this appears to be minimal still which is good news. There is no signs of active infection at this time which is also good news. 10/12/2019 upon evaluation today patient appears to  be doing okay with regard to her bilateral lower extremities today. The right is a little bit worse then last evaluation 2 weeks ago. The left is actually doing a little better in my opinion. Overall there is no signs of active infection at this time that I see. Obviously that something we have to keep a close eye on she is very prone to this with the significant and multiple openings that she has over the bilateral lower extremities. 10/19/2019 upon evaluation today patient appears to be doing about the best that I have seen her in quite some time. She has been tolerating the dressing changes without complication. There does not appear to be any signs of active infection and overall I am extremely happy with the way her legs appeared. She is drying up quite nicely and overall is having less pain. 11/09/2019 upon evaluation today patient appears to be doing better in regard to her  wounds. She seems to be drying up more and more each time I see her this is just taking a very long time. Fortunately there is no signs of active infection at this time. 11/23/2019 upon evaluation today patient actually appears to be doing excellent in regard to her lower extremities at this point compared to where she has been. Fortunately there is no signs of active infection at this time. She did go to the hospital last week for nausea and vomiting completely unrelated to her wounds. Fortunately she is doing better she was given some Reglan and got better. She had associated abdominal pain but they never found out what was going on. 12/07/2019 upon evaluation today patient appears to be doing well for the most part in regard to her legs. She unfortunately has not been keeping the Coban portion of her wraps on therefore the compression has not really been sufficient for what it is supposed to be. Nonetheless she tells me that it just hurt too bad therefore she removed it. 12/21/2019 upon evaluation today patient actually appears  to be doing quite well with regard to her legs. I do feel like she has been making progress which is great news and overall there is no signs of active infection at this time. No fevers, chills, nausea, vomiting, or diarrhea. 01/04/2020 upon evaluation today patient presents for follow-up concerning her lower extremity edema bilaterally. She still has open wounds she has not been using her lymphedema pumps. She is also not been utilizing her compression wraps appropriately she tends to unwrap them, take them off, or states that they hurt. Obviously the reason they hurt is because her legs start to swell but the issue is if she would use her compression/lymphedema pumps regularly she would not swell and she would have the pain. Nonetheless she has not even picked them up once honestly over the past several months and may be even as much as in the past year based on my opinion and what have seen. She tells me today that after last week when I talked about this with her specifically actually that was 2 weeks ago that she "forgot". 8//21 on evaluation today patient appears to be doing a little better in regard to her legs bilaterally. Fortunately there is no signs of active infection at this time. She tells me that she used her lymphedema pumps all of one time over the past 2 weeks since I last saw her. She tells me that she has been too busy in order to continue to use these. 02/01/2020 on evaluation today patient appears to be doing some better in regard to her wounds in general in her legs. We felt the right was healed although is not completely it does appear to be doing better she tells me she has been using her lymphedema pumps that she has had this six times since I last saw her. Obviously the more she does that the better she would do my opinion 02/15/2020 upon evaluation today patient appears to be doing about the same in regard to her legs. She tells me that she is pumping I'm still not sure how  much she does to be perfectly honest. However even if she does a little bit here and there I guess that is better than nothing. Fortunately there is no sign of active infection at this time which is great news. No fevers, chills, nausea, vomiting, or diarrhea. 02/29/2020 on evaluation today patient actually appears to be doing quite well all  things considered this week. She has been tolerating the dressing changes without complication. Fortunately there is no signs of active infection at this time. No fevers, chills, nausea, vomiting, or diarrhea. Objective Constitutional Well-nourished and well-hydrated in no acute distress. Vitals Time Taken: 11:40 AM, Height: 62 in, Weight: 335 lbs, BMI: 61.3, Temperature: 98.3 F, Pulse: 101 bpm, Respiratory Rate: 18 breaths/min, Blood Pressure: 172/93 mmHg. Respiratory normal breathing without difficulty. Psychiatric this patient is able to make decisions and demonstrates good insight into disease process. Alert and Oriented x 3. pleasant and cooperative. General Notes: Patient's wounds currently are showing signs of improvement I still do not know how much she is actually using her lymphedema pumps she also tells me she has been placed on medications for depression at this point. Integumentary (Hair, Skin) Wound #61 status is Open. Original cause of wound was Gradually Appeared. The wound is located on the Left,Circumferential Lower Leg. The wound measures 15.5cm length x 17.5cm width x 0.1cm depth; 213.039cm^2 area and 21.304cm^3 volume. There is Fat Layer (Subcutaneous Tissue) exposed. There is no tunneling or undermining noted. There is a large amount of serous drainage noted. The wound margin is flat and intact. There is medium (34-66%) pink, pale granulation within the wound bed. There is a medium (34-66%) amount of necrotic tissue within the wound bed including Adherent Slough. Wound #64 status is Open. Original cause of wound was Gradually Appeared.  The wound is located on the Left,Dorsal Foot. The wound measures 7cm length x 6.5cm width x 0.1cm depth; 35.736cm^2 area and 3.574cm^3 volume. There is Fat Layer (Subcutaneous Tissue) exposed. There is no tunneling or undermining noted. There is a medium amount of serous drainage noted. The wound margin is flat and intact. There is large (67-100%) pink, pale granulation within the wound bed. There is a small (1-33%) amount of necrotic tissue within the wound bed including Adherent Slough. Wound #65 status is Open. Original cause of wound was Gradually Appeared. The wound is located on the Right,Medial Lower Leg. The wound measures 1cm length x 0.8cm width x 0.1cm depth; 0.628cm^2 area and 0.063cm^3 volume. There is Fat Layer (Subcutaneous Tissue) exposed. There is no tunneling or undermining noted. There is a medium amount of serosanguineous drainage noted. The wound margin is distinct with the outline attached to the wound base. There is large (67-100%) red granulation within the wound bed. There is no necrotic tissue within the wound bed. 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 There was a Three Layer Compression Therapy Procedure by Carlene Coria, RN. Post procedure Diagnosis Wound #61: Same as Pre-Procedure Wound #65 Pre-procedure diagnosis of Wound #65 is a Venous Leg Ulcer located on the Right,Medial Lower Leg . There was a Three Layer Compression Therapy Procedure by Carlene Coria, RN. Post procedure Diagnosis Wound #65: Same as Pre-Procedure Plan Follow-up Appointments: Return Appointment in 2  weeks. Dressing Change Frequency: Wound #61 Left,Circumferential Lower Leg: Change dressing three times week. Wound #64 Left,Dorsal Foot: Change dressing three times week. Wound #65 Right,Medial Lower Leg: Change dressing three times week. Skin Barriers/Peri-Wound Care: Moisturizing lotion - to dry skin Other: - discontinue barrier cream (zinc paste) Wound Cleansing: Clean wound with Wound Cleanser - wash legs with soap and water with dressing changes May shower with protection. Primary Wound Dressing: Wound #61 Left,Circumferential Lower Leg: Calcium Alginate with Silver Wound #64 Left,Dorsal Foot: Calcium Alginate with Silver Wound #65 Right,Medial Lower Leg: Calcium Alginate with Silver Secondary Dressing: Wound #61 Left,Circumferential Lower Leg: Dry Gauze ABD pad - as needed Wound #64 Left,Dorsal Foot: Dry Gauze ABD pad - as needed Wound #65 Right,Medial Lower Leg: Dry Gauze Edema Control: 3 Layer Compression System - Bilateral Avoid standing for long periods of time - walking is encouraged Elevate legs to the level of the heart or above for 30 minutes daily and/or when sitting, a frequency of: - do not sleep in chair with feet dangling, MUST elevate legs while sitting Exercise regularly Segmental Compressive Device. - lymphedema pumps 60 minutes 1- 2 times per day Off-Loading: Turn and reposition every 2 hours Additional Orders / Instructions: Follow Nutritious Diet - T include vitamin A, vitamin C, and Zinc along with increased protein intake. o Home Health: [facility] for  wound care. - [facility]. I would recommend at this time that we go ahead and continue with the wound care measures as before specifically with regard to the alginate to keep things as dry as possible. 2. I am also can recommend at this time that we have her continue with the compression wraps I really think this does better for her in general as compared to just using her juxta light for the time being I think this is the best option. We will see patient back for reevaluation in 2 weeks here in the clinic. If anything worsens or changes patient will contact our office for additional recommendations. Electronic Signature(s) Signed: 02/29/2020 1:48:29 PM By: [name] PA-C Entered By: [name] on 02/29/2020 13:48:28 -------------------------------------------------------------------------------- SuperBill Details Patient Name: Date of Service: [name] 02/29/2020 Medical Record Number: [id number] Patient Account Number: [id number] Date of Birth/Sex: Treating RN: [birth year] (69 y.o. [name] Primary Care Provider: [name] Other Clinician: Referring Provider: Treating Provider/Extender: Doyle Askew, FRED Weeks in Treatment: 134 Diagnosis Coding ICD-10 Codes Code Description E11.622 Type 2 diabetes mellitus with other skin ulcer I89.0 Lymphedema, not elsewhere classified I87.331 Chronic venous hypertension (idiopathic) with ulcer and inflammation of right lower extremity I87.332 Chronic venous hypertension (idiopathic) with ulcer and inflammation of left lower extremity L97.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 leg with fat layer exposed L97.822 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 I10 Essential (primary) hypertension E66.01 Morbid (severe) obesity due to excess calories F41.8 Other specified  anxiety disorders R53.1 Weakness Facility Procedures Physician Procedures : CPT4 Code Description Modifier [id number] - WC PHYS LEVEL 3 - EST PT ICD-10 Diagnosis Description E11.622 Type 2 diabetes mellitus with other skin ulcer I89.0 Lymphedema, not elsewhere classified I87.331 Chronic venous hypertension (idiopathic) with  ulcer and inflammation of right lower extremity I87.332 Chronic venous hypertension (idiopathic) with ulcer and inflammation of left lower extremity Quantity: 1 Electronic Signature(s) Signed: 02/29/2020 1:48:45 PM By:  Melburn Hake, Jacquelynn Friend PA-C Entered By: [name] on 02/29/2020 13:48:40

## 2020-03-05 NOTE — Progress Notes (Signed)
[name], [name] ([id number]) Visit Report for 02/29/2020 Arrival Information [name] Patient Name: Date of Service: [name], [name] 02/29/2020 10:45 A M Medical Record Number: [id number] Patient Account Number: [id number] Date of Birth/Sex: Treating RN: 1950-10-11 (69 y.o. [name] Primary Care Alando Colleran: [name] Other Clinician: Referring Mashelle Busick: Treating Beautiful Pensyl/Extender: [name] Weeks in Treatment: 134 Visit Information History Since Last Visit Added or deleted any medications: No Patient Arrived: Wheel Chair Any new allergies or adverse reactions: No Arrival Time: 11:38 Had a fall or experienced change in No Accompanied By: self activities of daily living that may affect Transfer Assistance: None risk of falls: Patient Identification Verified: Yes Signs or symptoms of abuse/neglect since last visito No Secondary Verification Process Completed: Yes Hospitalized since last visit: No Patient Requires Transmission-Based Precautions: No Implantable device outside of the clinic excluding No Patient Has Alerts: Yes cellular tissue based products placed in the center Patient Alerts: R ABI= 1.01 since last visit: L ABI = .99 Has Dressing in Place as Prescribed: Yes Pain Present Now: Yes Electronic Signature(s) Signed: 03/01/2020 11:12:08 AM By: Sandre Kitty Entered By: Sandre Kitty on 02/29/2020 11:40:29 -------------------------------------------------------------------------------- Compression Therapy [name] Patient Name: Date of Service: [name]. 02/29/2020 10:45 A M Medical Record Number: [id number] Patient Account Number: [id number] Date of Birth/Sex: Treating RN: 1950/12/22 (69 y.o. [name] Primary Care Ran Tullis: [name] Other Clinician: Referring Ronya Gilcrest: Treating Brittanee Ghazarian/Extender: [name] Weeks in Treatment: 134 Compression Therapy Performed for Wound Assessment: Wound #61  Left,Circumferential Lower Leg Performed By: Clinician [name] Coria, RN Compression Type: Three Layer Post Procedure Diagnosis Same as Pre-procedure Electronic Signature(s) Signed: 02/29/2020 6:07:28 PM By: [name] Gouty RN, BSN Entered By: [name] on 02/29/2020 11:52:48 -------------------------------------------------------------------------------- Compression Therapy [name] Patient Name: Date of Service: [name]. 02/29/2020 10:45 A M Medical Record Number: [id number] Patient Account Number: [id number] Date of Birth/Sex: Treating RN: 08/23/1950 (69 y.o. [name] Primary Care Patrici Minnis: [name] Other Clinician: Referring Sabastion Hrdlicka: Treating Jamaine Quintin/Extender: [name] Weeks in Treatment: 134 Compression Therapy Performed for Wound Assessment: Wound #65 Right,Medial Lower Leg Performed By: Clinician [name] Coria, RN Compression Type: Three Layer Post Procedure Diagnosis Same as Pre-procedure Electronic Signature(s) Signed: 02/29/2020 6:07:28 PM By: [name] Gouty RN, BSN Entered By: [name] on 02/29/2020 11:52:48 -------------------------------------------------------------------------------- Encounter Discharge Information [name] Patient Name: Date of Service: [name]. 02/29/2020 10:45 A M Medical Record Number: [id number] Patient Account Number: [id number] Date of Birth/Sex: Treating RN: July 29, 1950 (69 y.o. [name] Primary Care Gracie Gupta: [name] Other Clinician: Referring Javohn Basey: Treating [name]/Extender: [name] Weeks in Treatment: [phone] Encounter Discharge Information Items Discharge Condition: Stable Ambulatory Status: Wheelchair Discharge Destination: Home Transportation: Private Auto Accompanied By: self Schedule Follow-up Appointment: Yes Clinical Summary of Care: Patient Declined Electronic Signature(s) Signed: 03/05/2020 1:15:48 PM By: [name] Coria  RN Entered By: [name] on 02/29/2020 12:51:03 -------------------------------------------------------------------------------- Lower Extremity Assessment [name] Patient Name: Date of Service: [name], [name] 02/29/2020 10:45 A M Medical Record Number: [id number] Patient Account Number: [id number] Date of Birth/Sex: Treating RN: 1950-08-09 (69 y.o. [name] Primary Care Xitlalic Maslin: [name] Other Clinician: Referring Tal Kempker: Treating [name]/Extender: [name] Weeks in Treatment: 134 Edema Assessment Assessed: [Left: Yes] [Right: Yes] Edema: [Left: Yes] [Right: Yes] Calf Left: Right: Point of Measurement: 37 cm From Medial Instep 42 cm 39 cm Ankle Left: Right: Point of Measurement: 10 cm From Medial Instep 29 cm 23.5  cm Electronic Signature(s) Signed: 02/29/2020 5:18:19 PM By: Deon Pilling Entered By: Deon Pilling on 02/29/2020 12:02:59 -------------------------------------------------------------------------------- [name] Patient Name: Date of Service: [name]. 02/29/2020 10:45 A M Medical Record Number: [id number] Patient Account Number: [id number] Date of Birth/Sex: Treating RN: 10/07/1950 (69 y.o. [name] Primary Care Quenesha Douglass: [name] Other Clinician: Referring Anderia Lorenzo: Treating Evaluna Utke/Extender: [name] Weeks in Treatment: [phone] Active Inactive Venous Leg Ulcer Nursing Diagnoses: Actual venous Insuffiency (use after diagnosis is confirmed) Knowledge deficit related to disease process and management Goals: Patient will maintain optimal edema control Date Initiated: 08/12/2017 Target Resolution Date: 03/14/2020 Goal Status: Active Patient/caregiver will verbalize understanding of disease process and disease management Date Initiated: 08/12/2017 Date Inactivated: 04/21/2018 Target Resolution Date: 04/24/2018 Goal Status: Met Interventions: Assess  peripheral edema status every visit. Compression as ordered Treatment Activities: Therapeutic compression applied : 08/12/2017 Notes: Wound/Skin Impairment Nursing Diagnoses: Impaired tissue integrity Knowledge deficit related to ulceration/compromised skin integrity Goals: Patient/caregiver will verbalize understanding of skin care regimen Date Initiated: 08/12/2017 Target Resolution Date: 03/14/2020 Goal Status: Active Ulcer/skin breakdown will have a volume reduction of 30% by week 4 Date Initiated: 08/05/2017 Date Inactivated: 09/30/2017 Target Resolution Date: 10/03/2017 Goal Status: Met Ulcer/skin breakdown will have a volume reduction of 50% by week 8 Date Initiated: 09/30/2017 Date Inactivated: 10/28/2017 Target Resolution Date: 10/28/2017 Goal Status: Met Interventions: Assess patient/caregiver ability to perform ulcer/skin care regimen upon admission and as needed Assess ulceration(s) every visit Provide education on ulcer and skin care Screen for HBO Treatment Activities: Patient referred to home care : 08/05/2017 Skin care regimen initiated : 08/05/2017 Topical wound management initiated : 08/05/2017 Notes: Electronic Signature(s) Signed: 02/29/2020 6:07:28 PM By: [name] Gouty RN, BSN Entered By: [name] on 02/29/2020 11:51:42 -------------------------------------------------------------------------------- Pain Assessment [name] Patient Name: Date of Service: [name]. 02/29/2020 10:45 A M Medical Record Number: [id number] Patient Account Number: [id number] Date of Birth/Sex: Treating RN: Sep 13, 1950 (69 y.o. [name] Primary Care Marrianne Sica: [name] Other Clinician: Referring Stacia Feazell: Treating Denesha Brouse/Extender: [name] Weeks in Treatment: [phone] Active Problems Location of Pain Severity and Description of Pain Patient Has Paino Yes Site Locations Rate the pain. Worst Pain Level: 4 Pain Management and  Medication Current Pain Management: Electronic Signature(s) Signed: 02/29/2020 6:07:28 PM By: [name] Gouty RN, BSN Signed: 03/01/2020 11:12:08 AM By: Sandre Kitty Entered By: Sandre Kitty on 02/29/2020 11:41:04 -------------------------------------------------------------------------------- Patient/Caregiver Education [name] Patient Name: Date of Service: [name] 9/15/2021andnbsp10:45 A M Medical Record Number: [id number] Patient Account Number: [id number] Date of Birth/Gender: Treating RN: [birth year] (69 y.o. [name] Primary Care Physician: [name] Other Clinician: Referring Physician: Treating Physician/Extender: [name] Weeks in Treatment: [phone] Education Assessment Education Provided To: Patient Education Topics Provided Venous: Methods: Explain/Verbal Responses: Reinforcements needed, State content correctly Wound/Skin Impairment: Methods: Explain/Verbal Responses: Reinforcements needed, State content correctly Electronic Signature(s) Signed: 02/29/2020 6:07:28 PM By: [name] Gouty RN, BSN Entered By: [name] on 02/29/2020 11:52:13 -------------------------------------------------------------------------------- Wound Assessment [name] Patient Name: Date of Service: [name]. 02/29/2020 10:45 A M Medical Record Number: [id number] Patient Account Number: [id number] Date of Birth/Sex: Treating RN: Dec 17, 1950 (69 y.o. [name] Primary Care Tyese Finken: [name] Other Clinician: Referring Markevion Lattin: Treating Jennise Both/Extender: [name] Weeks in Treatment: 134 Wound Status Wound Number: 61 Primary Venous Leg Ulcer Etiology: Wound Location: Left, Circumferential Lower Leg Wound Open Wounding Event: Gradually Appeared Status: Date Acquired: 04/20/2019 Comorbid Asthma,  Hypertension, Peripheral Arterial Disease, Peripheral Weeks Of Treatment: 45 History: Venous Disease,  Type II Diabetes, Gout, Osteoarthritis Clustered Wound: Yes Photos Photo Uploaded By: Mikeal Hawthorne on 03/01/2020 14:11:16 Wound Measurements Length: (cm) 15.5 Width: (cm) 17.5 Depth: (cm) 0.1 Clustered Quantity: 5 Area: (cm) 213.039 Volume: (cm) 21.304 Wound Description Classification: Full Thickness Without Exposed Support Struct Wound Margin: Flat and Intact Exudate Amount: Large Exudate Type: Serous Exudate Color: amber Foul Odor After Cleansing: Slough/Fibrino % Reduction in Area: -2725.5% % Reduction in Volume: -2725.5% Epithelialization: Small (1-33%) Tunneling: No Undermining: No ures No Yes Wound Bed Granulation Amount: Medium (34-66%) Exposed Structure Granulation Quality: Pink, Pale Fascia Exposed: No Necrotic Amount: Medium (34-66%) Fat Layer (Subcutaneous Tissue) Exposed: Yes Necrotic Quality: Adherent Slough Tendon Exposed: No Muscle Exposed: No Joint Exposed: No Bone Exposed: No Treatment Notes Wound #61 (Left, Circumferential Lower Leg) 1. Cleanse With Wound Cleanser Soap and water 3. Primary Dressing Applied Calcium Alginate Ag 4. Secondary Dressing Dry Gauze 6. Support Layer Applied 3 layer compression wrap Notes netting Electronic Signature(s) Signed: 02/29/2020 5:18:19 PM By: Deon Pilling Entered By: Deon Pilling on 02/29/2020 12:03:48 -------------------------------------------------------------------------------- Wound Assessment [name] Patient Name: Date of Service: BRIAN, KOCOUREK 02/29/2020 10:45 A M Medical Record Number: [id number] Patient Account Number: [id number] Date of Birth/Sex: Treating RN: 1951/04/01 (69 y.o. [name] Primary Care  Jon: [name] Other Clinician: Referring Rayquan Amrhein: Treating Hollyanne Schloesser/Extender: [name] Weeks in Treatment: 134 Wound Status Wound Number: 64 Primary Diabetic Wound/Ulcer of the Lower Extremity Etiology: Wound Location: Left, Dorsal Foot Wound  Open Wounding Event: Gradually Appeared Status: Date Acquired: 06/01/2019 Comorbid Asthma, Hypertension, Peripheral Arterial Disease, Peripheral Weeks Of Treatment: 39 History: Venous Disease, Type II Diabetes, Gout, Osteoarthritis Clustered Wound: No Photos Photo Uploaded By: Mikeal Hawthorne on 03/01/2020 14:10:11 Wound Measurements Length: (cm) 7 Width: (cm) 6.5 Depth: (cm) 0.1 Area: (cm) 35.736 Volume: (cm) 3.574 % Reduction in Area: -21558.2% % Reduction in Volume: -7193.9% Epithelialization: Small (1-33%) Tunneling: No Undermining: No Wound Description Classification: Grade 1 Wound Margin: Flat and Intact Exudate Amount: Medium Exudate Type: Serous Exudate Color: amber Foul Odor After Cleansing: No Slough/Fibrino Yes Wound Bed Granulation Amount: Large (67-100%) Exposed Structure Granulation Quality: Pink, Pale Fascia Exposed: No Necrotic Amount: Small (1-33%) Fat Layer (Subcutaneous Tissue) Exposed: Yes Necrotic Quality: Adherent Slough Tendon Exposed: No Muscle Exposed: No Joint Exposed: No Bone Exposed: No Treatment Notes Wound #64 (Left, Dorsal Foot) 1. Cleanse With Wound Cleanser Soap and water 3. Primary Dressing Applied Calcium Alginate Ag 4. Secondary Dressing Dry Gauze 6. Support Layer Applied 3 layer compression wrap Notes netting Electronic Signature(s) Signed: 02/29/2020 5:18:19 PM By: Deon Pilling Signed: 02/29/2020 6:07:28 PM By: [name] Gouty RN, BSN Entered By: Deon Pilling on 02/29/2020 12:04:20 -------------------------------------------------------------------------------- Wound Assessment [name] Patient Name: Date of Service: [name]. 02/29/2020 10:45 A M Medical Record Number: [id number] Patient Account Number: [id number] Date of Birth/Sex: Treating RN: 1951/02/02 (69 y.o. [name] Primary Care Nikki Rusnak: [name] Other Clinician: Referring Jarrett Chicoine: Treating Evolette Pendell/Extender: [name] Askew,  [name] Weeks in Treatment: 134 Wound Status Wound Number: 65 Primary Venous Leg Ulcer Etiology: Wound Location: Right, Medial Lower Leg Wound Open Wounding Event: Gradually Appeared Status: Date Acquired: 09/28/2019 Comorbid Asthma, Hypertension, Peripheral Arterial Disease, Peripheral Weeks Of Treatment: 22 History: Venous Disease, Type II Diabetes, Gout, Osteoarthritis Clustered Wound: No Photos Photo Uploaded By: Mikeal Hawthorne on 03/01/2020 14:10:12 Wound Measurements Length: (cm) 1 Width: (cm) 0.8 Depth: (cm) 0.1 Area: (cm) 0.628 Volume: (cm)  0.063 % Reduction in Area: 42.9% % Reduction in Volume: 42.7% Epithelialization: Large (67-100%) Tunneling: No Undermining: No Wound Description Classification: Full Thickness Without Exposed Support Structures Wound Margin: Distinct, outline attached Exudate Amount: Medium Exudate Type: Serosanguineous Exudate Color: red, brown Foul Odor After Cleansing: No Slough/Fibrino Yes Wound Bed Granulation Amount: Large (67-100%) Exposed Structure Granulation Quality: Red Fascia Exposed: No Necrotic Amount: None Present (0%) Fat Layer (Subcutaneous Tissue) Exposed: Yes Tendon Exposed: No Muscle Exposed: No Joint Exposed: No Bone Exposed: No Treatment Notes Wound #65 (Right, Medial Lower Leg) 1. Cleanse With Wound Cleanser Soap and water 3. Primary Dressing Applied Calcium Alginate Ag 4. Secondary Dressing Dry Gauze 6. Support Layer Applied 3 layer compression wrap Notes netting Electronic Signature(s) Signed: 02/29/2020 5:18:19 PM By: Deon Pilling Signed: 02/29/2020 6:07:28 PM By: [name] Gouty RN, BSN Entered By: Deon Pilling on 02/29/2020 12:05:23 -------------------------------------------------------------------------------- Vitals [name] Patient Name: Date of Service: [name]. 02/29/2020 10:45 A M Medical Record Number: [id number] Patient Account Number: [id number] Date of Birth/Sex: Treating  RN: 1951/06/01 (69 y.o. [name] Primary Care Ligia Duguay: [name] Other Clinician: Referring Chastin Riesgo: Treating Devra Stare/Extender: [name] Weeks in Treatment: 134 Vital Signs Time Taken: 11:40 Temperature (F): 98.3 Height (in): 62 Pulse (bpm): 101 Weight (lbs): 335 Respiratory Rate (breaths/min): 18 Body Mass Index (BMI): 61.3 Blood Pressure (mmHg): 172/93 Reference Range: 80 - 120 mg / dl Electronic Signature(s) Signed: 03/01/2020 11:12:08 AM By: Sandre Kitty Entered By: Sandre Kitty on 02/29/2020 11:40:52

## 2020-03-14 ENCOUNTER — Other Ambulatory Visit: Payer: Self-pay

## 2020-03-14 ENCOUNTER — Encounter (HOSPITAL_BASED_OUTPATIENT_CLINIC_OR_DEPARTMENT_OTHER): Payer: Medicare PPO | Admitting: Physician Assistant

## 2020-03-14 DIAGNOSIS — E11622 Type 2 diabetes mellitus with other skin ulcer: Secondary | ICD-10-CM | POA: Diagnosis not present

## 2020-03-14 NOTE — Progress Notes (Addendum)
ALEXISS, ITURRALDE ([id number]) Visit Report for 03/14/2020 Chief Complaint Document Details Patient Name: Date of Service: TZIPPORAH, NAGORSKI 03/14/2020 10:15 A M Medical Record Number: [id number] Patient Account Number: [id number] Date of Birth/Sex: Treating RN: 10/18/1950 (69 y.o. [name] Primary Care Provider: [name] Other Clinician: Referring Provider: Treating Provider/Extender: Doyle Askew, FRED Weeks in Treatment: [place] from: Patient Chief Complaint Bilateral reoccurring LE ulcers Electronic Signature(s) Signed: 03/14/2020 10:42:19 AM By: [name] PA-C Entered By: [name] on 03/14/2020 10:42:19 -------------------------------------------------------------------------------- HPI Details Patient Name: Date of Service: [name] 03/14/2020 10:15 A M Medical Record Number: [id number] Patient Account Number: [id number] Date of Birth/Sex: Treating RN: 14-Oct-1950 (69 y.o. [name] Primary Care Provider: [name] Other Clinician: Referring Provider: Treating Provider/Extender: Doyle Askew, FRED Weeks in Treatment: 136 History of Present Illness HPI Description: this patient has been seen a couple of times before and returns with recurrent problems to her right and left lower extremity with swelling and weeping ulcerations due to not wearing her compression stockings which she had been advised to do during her last discharge, at the end of June 2018. During her last visit the patient had had normal arterial blood flow and her venous reflux study did not necessitate any surgical intervention. She was recommended compression and elevation and wound care. After prolonged treatment the patient was completely healed but she has been noncompliant with wearing or compressions.. She was here last week with an outpatient return visit planned but the patient came in a very poor general condition with altered  mental status and was rushed to the ER on my request. With a history of hypertension, diabetes, TIA and right-sided weakness she was set up for an MRI on her brain and cervical spine and was sent to [facility]. Getting an MRI done was very difficult but once the workup was done she was found not to have any spinal stenosis, epidural abscess or hematoma or discitis. This was radiculopathy to be treated as an outpatient and she was given a follow-up appointment. Today she is feeling much better alert and oriented and has come to reevaluate her bilateral lower extremity lymphedema and ulceration 03/25/2017 -- she was admitted to the hospital on 03/16/2017 and discharged on 03/18/2017 with left leg cellulitis and ulceration. She was started on vancomycin and Zosyn and x-ray showed no bony involvement. She was treated for a cellulitis with IV antibiotics changed to Rocephin and Flagyl and was discharged on oral Keflex and doxycycline to complete a 7 day course. Last hemoglobin A1c was 7.1 and her other ailments including hypertension got asthma were appropriately treated. 05/06/2017 -- she is awaiting the right size of compression stockings from  but other than that has been doing well. ====== Old notes 69 year old patient was seen one time last October and was lost to follow-up. She has recurrent problems with weeping and ulceration of her left lower extremity and has swelling of this for several years. It has been worse for the last 2 months. Past medical history is significant for diabetes mellitus type 2, hypertension, gout, morbid obesity, depressive disorders, hiatal hernia, migraines, status post knee surgery, risk of a cholecystectomy, vaginal hysterectomy and breast biopsy. She is not a smoker. As noted before she has never had a venous duplex study and an arterial ABI study was attempted but the left lower extremity was noncompressible 10/01/2016 -- had a lower extremity venous duplex  reflux evaluation which showed  no evidence of deep vein reflux in the right or left lower extremity, and no evidence of great saphenous vein reflux more than 500 ms in the right or left lower extremity, and the left small saphenous vein is incompetent but no vascular consult was recommended. review of her electronic medical records noted that the ABI was checked in July 2017 where the right ABI was normal limits and the left ABI could not be ascertained due to pain with cuff pressure but the waveforms are within normal limits. her arterial duplex study scheduled for April 27. 10/08/2016 -- the patient has various reasons for not having a compression on and for the last 3 days she has had no compression on her left lower extremity either due to pain or the lack of nursing help. She does not use her juxta lites either. 10/15/2016 -- the patient did not keep her appointment for arterial duplex study on April 27 and I have asked her to reschedule this. Her pain is out of proportion with the physical findings and she continuously fails to wear a compression wraps and cuts them off because she says she cannot tolerate the pain. She does not use her juxta lites either. 10/22/2016 -- he has rescheduled her arterial duplex study to May 21 and her pain today is a bit better. She has not been wearing her juxta lites on her right lower extremity but now understands that she needs to do this. She did tolerate the to press compression wrap on her left lower extremity 10/29/2016 --arterial duplex study is scheduled for next week and overall she has been tolerating her compression wraps and also using her juxta lites on her right lower extremity 11/05/2016 -- the right ABI was 0.95 the left was 1.03. The digit TBI is on the right was 0.83 on the left was 0.92 and she had biphasic flow through these vessels. The impression was that of normal lower extremity arterial study. 11/12/2016 -- her pain is minimal and she  is doing very well overall. 11/26/2016 -- she has got juxta lites and her insurance will not pay for additional dual layer compression stockings. She is going to order some from [place]. 05/12/2017 -- her juxta lites are very old and too big for her and these have not been helping with compression. She did get 20-30 mm compression stockings from [employer] but she and her husband are unable to put these on. I believe she will benefit from bilateral Extremit-ease, compression stockings and we will measure her for these today. 05/20/2017 -- lymphedema on the left lower extremity has increased a lot and she has a open ulceration as a result of this. The right lower extremity is looking pretty good. She has decided to by the compression stockings herself and will get reimbursed by the home health, at a later date. 05/27/2017 -- her sciatica is bothering her a lot and she thought her left leg pain was caused due to the compression wrap and hence removed it and has significant lymphedema. There is no inflammation on this left lower extremity. 06/17/17 on evaluation today patient appears to be doing very well and in fact is completely healed in regard to her ulcerations. Unfortunately however she does have continued issues with lymphedema nonetheless. We did order compression garments for her unfortunately she states that the size that she received were large although we ordered medium. Obviously this means she is not getting the optimal compression. She does not have those with her today and therefore  we could not confirm and contact the company on her behalf. Nonetheless she does state that she is going to have her husband bring them by tomorrow so that we can verify and then get in touch with the company. No fevers, chills, nausea, or vomiting noted at this time. Overall patient is doing better otherwise and I'm pleased with the progress she has made. 07/01/17 on evaluation today patient appears to be doing  very well in regard to her bilateral lower extremity she does not have any openings at this point which is excellent news. Overall I'm pleased with how things have progressed up to this time. Since she is doing so well we did order her compression which we are seeing her today to ensure that it fits her properly and everything is doing well in that regard and then subsequently she will be discharged. ============ Old Notes: 03/31/16 patient presents today for evaluation concerning open wounds that she has over the left medial ankle region as well as the left dorsal foot. She has previously had this occur although it has been healed for a number of months after having this for about a year prior until her hospitalization on 01/05/16. At that point in time it appears that she was admitted to the hospital for left lower extremity cellulitis and was placed on vancomycin and Zosyn at that point. Eventually upon discharge on January 15, 2016 she was placed on doxycycline at that point in time. Later on 03/27/16 positive wound culture growing Escherichia coli this was switched to amoxicillin. Currently she tells me that she is having pain radiated to be a 7 out of 10 which can be as high as 10 out of 10 with palpation and manipulation of the wound. This wound appears to be mainly venous in nature due to the bilateral lower extremity venous stasis/lymphedema. This is definitely much worse on her left than the right side. She does have type 1 diabetes mellitus, hypertension, morbid obesity, and is wheelchair dependent.during the course of the hospital stay a blood culture was also obtained and fortunately appeared negative. She also had an x-ray of the tibia/fibula on the left which showed no acute bone abnormality. Her white blood cell count which was performed last on 03/25/16 was 7.3, hemoglobin 12.8, protein 7.1, albumin 3.0. Her urine culture appeared to be negative for any specific organisms. Patient did  have a left lower extremity venous duplex evaluation for DVT . This did not include venous reflux studies but fortunately was negative for DVT Patient also had arterial studies performed which revealed that she had a . normal ABI on the right though this was unable to be performed on the left secondary to pain that she was having around the ankle region due to the wound. However it was stated on report that she had biphasic pulses and apparently good blood flow. ========== 06/03/17 she is here in follow-up evaluation for right lower extremity ulcer. The right lower sure he has healed but she has reopened to the left medial malleolus and dorsal foot with weeping. She is waiting for new compression garments to arrive from home health, the previous compression garments were ill fitting. We will continue with compression bilaterally and follow-up in 2 weeks Readmission: 08/05/17 on evaluation today patient appears to be doing somewhat poorly in regard to her left lower extremity especially although the right lower extremity has a small area which may no longer be open. She has been having a lot of drainage from the  left lower extremity however he tells me that she has not been able to use the EXTREMIT-EASE Compression at this point. She states that she did better and was able to actually apply the Juxta-Lite compression although the wound that she has is too large and therefore really does not compress which is why she cannot wear it at this point. She has no one who can help her put it on regular basis her son can sometimes but he's not able to do it most of the time. I do believe that's why she has begun to weave and have issues as she is currently yet again. No fevers, chills, nausea, or vomiting noted at this time. Patient is no evidence of dementia. 08/12/17 on evaluation today patient appears to still be doing fairly well in regard to the draining areas/weeping areas at this point. With that being  said she unfortunately did go to the ER yesterday due to what was felt to be possibly a cellulitis. They place her on doxycycline by mouth and discharge her home. She definitely was not admitted. With that being said she states she has had more discomfort which has been unusual for her even compared to prior times and she's had infections.08/12/17 on evaluation today patient appears to still be doing fairly well in regard to the draining areas/weeping areas at this point. With that being said she unfortunately did go to the ER yesterday due to what was felt to be possibly a cellulitis. They place her on doxycycline by mouth and discharge her home. She definitely was not admitted. With that being said she states she has had more discomfort which has been unusual for her even compared to prior times and she's had infections. 08/19/17 put evaluation today patient tells me that she's been having a lot of what sounds to be neuropathic type pain in regard to her left lower extremity. She has been using over-the-counter topical bins again which some believe. That in order to apply the she actually remove the wrap we put on her last Wednesday on Thursday. Subsequently she has not had anything on compression wise since that time. The good news is a lot of the weeping areas appear to have closed at this point again I believe she would do better with compression but we are struggling to get her to actually use what she needs to at this point. No fevers, chills, nausea, or vomiting noted at this time. 09/03/17 on evaluation today patient appears to be doing okay in regard to her lower extremities in regard to the lymphedema and weeping. Fortunately she does not seem to show any signs of infection at this point she does have a little bit of weeping occurring in the right medial malleolus area. With that being said this does not appear to be too significant which is good news. 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 She has severe skin damage secondary to both of these features involving the dorsal left foot and medial left ankle and lower leg. Still has open areas in the left anterior foot. The area on the right closed over. She uses her own juxta light stockings. She does not have an arterial issue 09/16/17 on evaluation today patient actually appears to be doing excellent in regard to her bilateral lower extremity swelling. The Juxta-Lite compression wrap seem to be doing very well for her. She has not however been using the portion that goes over her foot.  Her left foot still is draining a little bit not nearly as significant as it has been in the past but still I do believe that she likely needs to utilize the full wrap including the foot portion of this will improve as well. She also has been apparently putting on a significant amount of Vaseline which also think is not helpful for her. I recommended that if she feels she needs something for moisturizer Eucerin will probably be better. 09/30/17 on evaluation today patient presents with several new open areas in regard to her left lower extremity although these appear to be minimal and mainly seem to be more moisture breakdown than anything. Fortunately she does not seem to have any evidence of infection which is great news. She has been tolerating the dressing changes without complication we are using silver alginate on the foot she has been using AB pads to have the legs and using her Juxta- Lite compression which seems to be controlling her swelling very well. Overall I'm pleased with the poor way she has progressed. 10/14/17 on evaluation today patient appears to be doing better in regard to her left lower extremity areas of weeping. She does still have some discomfort although in general this does not appear to be as macerated and I think it is progressing nicely. I do think she still  needs to wear the foot portion of her Juxta- Lite in order to get the most benefit from the wrap obviously. She states she understands. Fortunately there does not appear to be evidence of infection at this time which is great news. 10/28/17 on evaluation today patient appears to be doing excellent in regard to her left lower extremity. She has just a couple areas that are still open and seem to be causing any trouble whatsoever. For that reason I think that she is definitely headed in the right direction the spots are very tiny compared to what we have been dealing with in the past. 11/11/17 on evaluation today patient appears to have a right lateral lower extremity ulcer that has opened since I last saw her. She states this is where the home health nurse that was coming out remove the dressing without wetting the alginate first. Nonetheless I do not know if this is indeed the case or not but more importantly we have not ordered home help to be coming out for her wounds at all. I'm unsure as to why they are coming out and we're gonna have to check on this and get things situated in that regard. With that being said we currently really do not need them to be coming out as the patient has been taking care of her leg herself without complication and no issues. In fact she was doing much better prior to nursing coming out. 11/25/17 on evaluation today patient actually appears to be doing fairly well in regard to her left lower extremity swelling. In fact she has very little area of weeping at this point there's just a small spot on the lateral portion of her right leg that still has me just a little bit more concerned as far as wanting to see this clear up before I discharge her to caring for this at home. Nonetheless overall she has made excellent progress. 12/09/17 on evaluation today patient appears to be doing rather well in regard to her lower extremity edema. She does have some weeping still in the left  lower extremity although the big area we were taking care of two  weeks ago actually has closed and she has another area of weeping on the left lower extremity immediately as well is the top of her foot. She does not currently have lymphedema pumps she has been wearing her compression daily on a regular basis as directed. With that being said I think she may benefit from lymphedema pumps. 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 We had a very difficult time getting and keeping this under control. 12/23/17 on evaluation today patient actually appears to be doing a little bit more poorly in regard to her bilateral lower extremities. She has been tolerating the Juxta-Lite compression wraps. Unfortunately she has two new ulcers on the right lower extremity and left lower Trinity ulceration seems to be larger. Obviously this is not good news. She has been tolerating the dressings without complication. 12/30/17 on evaluation today patient actually appears to be doing much better in regard to her bilateral lower extremity edema. She continues to have some issues with ulcerations and in fact there appears to be one spot on each leg where the wrap may have caused a little bit of a blister which is subsequently opened up at this point is given her pain. Fortunately it does not appear to be any evidence of infection which is good news. No fevers chills noted. 01/13/18 on evaluation today patient appears to be doing rather well in regard to her bilateral lower extremities. The dressings did get kind of stuck as far as the wound beds are concerned but again I think this is mainly due to the fact that she actually seems to be showing signs of healing which is good news. She's not having as much drainage therefore she was having more of the dressing sticking.  Nonetheless overall I feel like her swelling is dramatically down compared to previous. 01/20/18 on evaluation today patient unfortunately though she's doing better in most regards has a large blister on the left anterior lower extremity where she is draining quite significantly. Subsequently this is going to need debridement today in order to see what's underneath and ensure she does not continue to trapping fluid at this location. Nonetheless No fevers, chills, nausea, or vomiting noted at this time. 01/27/18 on evaluation today patient appears to be doing rather well at this point in regard to her right lower extremity there's just a very small area that she still has open at this point. With that being said I do believe that she is tolerating the compression wraps very well in making good progress. Home health is coming out at this point to see her. Her left lower extremity on the lateral portion is actually what still mainly open and causing her some discomfort for the most part 02/10/18 on evaluation today patient actually appears to be doing very well in regard to her right lower extremity were all the ulcers appear to be completely close. In regard to the left lower extremity she does have two areas still open and some leaking from the dorsal surface of her foot but this still seems to be doing much better to me in general. 02/24/18 on evaluation today patient actually appears to be doing much better in regard to her right lower extremity this is still completely healed. Her left lower extremity is also doing much better fortunately she has no evidence of infection. The one  area that is gonna require some debridement is still on the left anterior shin. Fortunately this is not hurting her as badly today. 03/10/18 on evaluation today patient appears to be doing better in some regards although she has a little bit more open area on the dorsal foot and she also has some issues on the medial portion of  the left lower extremity which is actually new and somewhat deep. With that being said there fortunately does not appear to be any significant signs of infection which is good news. No fevers, chills, nausea, or vomiting noted at this time. In general her swelling seems to be doing fairly well which is good news. 03/31/18 on evaluation today patient presents for follow-up concerning her left lower extremity lymphedema. Unfortunately she has been doing a little bit more poorly since I last saw her in regard to the amount of weeping that she is experiencing. She's also having some increased pain in the anterior shin location. Unfortunately I do not feel like the patient is making such good progress at this point a few weeks back she was definitely doing much better. 04/07/18 on evaluation today patient actually appears to be showing some signs of improvement as far as the left lower extremity is concerned. She has been tolerating the dressing changes and it does appear that the Drawtex did better for her. With that being said unfortunately home health is stating that they cannot obtain the Drawtex going forward. Nonetheless we're gonna have to check and see what they may be able to get the alginate they were using was getting stuck in causing new areas of skin being pulled all that with and subsequently weep and calls her to worsen overall this is the first time we've seen improvement at this time. 04/14/18 on evaluation today patient actually appears to be doing rather well at this point there does not appear to be any evidence of infection at this time and she is actually doing excellent in regard to the weeping in fact she almost has no openings remaining even compared to just last week this is a dramatic improvement. No fevers chills noted 04/21/18 evaluation today patient actually appears to be doing very well. She in fact is has a small area on the posterior lower extremity location and she has  a small area on the dorsal surface of her foot that are still open both of which are very close to closing. We're hoping this will be close shortly. She brought her Juxta-Lite wrap with her today hoping that would be able to put her in it unfortunately I don't think were quite at that point yet but we're getting closer. 04/28/18 upon evaluation today patient actually appears to be doing excellent in regard to her left lower extremity ulcer. In fact the region on the posterior lower extremity actually is much smaller than previously noted. Overall I'm very happy with the progress she has made. She again did bring her Juxta-Lite although we're not quite ready for that yet. 05/11/18 upon evaluation today patient actually appears to be doing in general fairly well in regard to her left lower Trinity. The swelling is very well controlled. With that being said she has a new area on the left anterior lower extremity as well as between the first and second toes of her left foot that was not present during the last evaluation. The region of her posterior left lower extremity actually appears to be almost completely healed. T be honest I'm very pleased  with o the way that stands. Nonetheless I do believe that the lotion may be keeping the area to moist as far as her legs are concerned subsequently I'm gonna consider discontinuing that today. 05/26/18 on evaluation today patient appears to be doing rather well in regard to her left lower should be ulcers. In fact everything appears to be close except for a very small area on the left posterior lower extremity. Fortunately there does not appear to be any evidence of infection at this time. Overall very pleased with her progress. 06/02/18 and evaluation today patient actually appears to be doing very well in regard to her lower extremity ulcers. She has one small area that still continues to weep that I think may benefit her being able to justify lotion and user  Juxta-Lite wraps versus continued to wrap her. Nonetheless I think this is something we can definitely look into at this point. 06/23/18 on evaluation today patient unfortunately has openings of her bilateral lower extremities. In general she seems to be doing much worse than when I last saw her just as far as her overall health standpoint is concerned. She states that her discomfort is mainly due to neuropathy she's not having any other issues otherwise. No fevers, chills, nausea, or vomiting noted at this time. 06/30/18 on evaluation today patient actually appears to be doing a little worse in regard to her right lower extremity her left lower extremity of doing fairly well. Fortunately there is no sign of infection at this time. She has been tolerating the dressing changes without complication. Home health did not come out like they were supposed to for the appropriate wrap changes. They stated that they never received the orders from [place] which were fax. Nonetheless we will send a copy of the orders with the patient today as well. 07/07/18 on evaluation today patient appears to be doing much better in regard to lower extremities. She still has several openings bilaterally although since I last saw her her legs did show obvious signs of infection when she later saw her nurse. Subsequently a culture was obtained and she is been placed on Bactrim and Keflex. Fortunately things seem to be looking much better it does appear she likely had an infection. Again last week we'd even discussed it but again there really was not any obvious sign that she had infection therefore we held off on the antibiotics. Nonetheless I'm glad she's doing better today. 07/14/18 on evaluation today patient appears to be doing much better regarding her bilateral lower Trinity's. In fact on the right lower for me there's nothing open at this point there are some dry skin areas at the sites where she had infection. Fortunately there is  no evidence of systemic infection which is excellent news. No fevers chills noted 07/21/18 on evaluation today patient actually appears to be doing much better in regard to her left lower extremity ulcers. She is making good progress and overall I feel like she's improving each time I see her. She's having no pain I do feel like the infection is completely resolved which is excellent news. No fevers, chills, nausea, or vomiting noted at this time. 07/28/18 on evaluation today patient appears to be doing very well in regard to her left lower [employer]. Everything seems to be showing signs of improvement which is excellent news. Overall very pleased with the progress that has been made. Fortunately there's no evidence of active infection at this time also excellent news. 08/04/18 on evaluation  today patient appears to be doing more poorly in regard to her bilateral lower extremities. She has two new areas open up on the right and these were completely closed as of last week. She still has the two spots on the left which in my pinion seem to be doing better. Fortunately there's no evidence of infection again at this point. 08/11/18 on evaluation today patient actually appears to be doing very well in regard to her bilateral lower Trinity wounds that all seem to be doing better and are measures smaller today. Fortunately there's no signs of infection. No fevers, chills, nausea, or vomiting noted at this time. 08/18/18 on evaluation today patient actually appears to be doing about the same inverter bilateral lower extremities. She continues to have areas that blistering open as was drain that fortunately nothing too significant. Overall I feel like Drawtex may have done better for her however compared to the collagen. 08/25/18 on evaluation today patient appears to be doing a little bit more poorly today even compared to last time I saw her. Again I'm not exactly sure why she's making worse progress over  the past several weeks. I'm beginning to wonder if there is some kind of underlying low level infection causing this issue. I did actually take a culture from the left anterior lower extremity but it was a new wound draining quite a bit at this point. Unfortunately she also seems to be having more pain which is what also makes me worried about the possibility of infection. This is despite never erythema noted at this point. 09/01/18 on evaluation today patient actually appears to be doing a little worse even compared to last week in regard to bilateral lower extremities. She did go to the hospital on the 16th was given a dose of IV Zosyn and then discharged with a recommendation to continue with the Bactrim that I previously prescribed for her. Nonetheless she is still having a lot of discomfort she tells me as well at this time. This is definitely unfortunate. No fevers, chills, nausea, or vomiting noted at this time. 09/08/18 on evaluation today patient's bilateral lower extremities actually appear to be shown signs of improvement which is good news. Fortunately there does not appear to be any signs of active infection I think the anabiotic is helping in this regard. Overall I'm very pleased with how she is progressing. 09/15/18 patient was actually seen in ER yesterday due to her legs as well unfortunately. She states she's been having a lot of pain and discomfort as well as a lot of drainage. Upon inspection today the patient does have a lot of swelling and drainage I feel like this is more related to lymphedema and poor fluid control than it is to infection based on what I'm seeing. The physician in the emergency department also doubted that the patient was having a significant infection nonetheless I see no evidence of infection obvious at this point although I do see evidence of poor fluid control. She still not using a compression pumps, she is not elevating due to her lift chair as well as her  hospital bed being broken, and she really is not keeping her legs up as much as they should be and also has been taking off her wraps. All this combined I think has led to poor fluid control and to be honest she may be somewhat volume overloaded in general as well. I recommend that she may need to contact your physician to see if a  prescription for a diuretic would be beneficial in their opinion. As long as this is safe I think it would likely help her. 09/29/18 on evaluation today patient's left lower extremity actually appears to be doing quite a bit better. At least compared to last time that I saw her. She still has a large area where she is draining from but there's a lot of new skin speckled trout and in fact there's more new skin that there are open areas of weeping and drainage at this point. This is good news. With regard to the right lower extremity this is doing much better with the only open area that I really see being a dry spot on the right lateral ankle currently. Fortunately there's no signs of active infection at this time which is good news. No fevers, chills, nausea, or vomiting noted at this time. The patient seems somewhat stressed and overwhelmed during the visit today she was very lethargic as such. She does and she is not taking any pain medications at this point. Apparently according to her husband are also in the process of moving which is probably taking its toll on her as well. 10/06/18 on evaluation today patient appears to be doing rather well in regard to her lower extremities compared to last evaluation. Fortunately there's no signs of active infection. She tells me she did have an appointment with her primary. Nonetheless he was concerned that the wounds were somewhat deep based on pictures but we never actually saw her legs. She states that he had her somewhat worried due to the fact that she was fearing now that she was [place] have to have an amputation. With that being  said based on what I'm seeing check she looks better this week that she has the last two times I've seen her with much less drainage I'm actually pleased in this regard. That doesn't mean that she's out of the water but again I do not think what the point of talking about education at all in regard to her leg. She is very happy to hear this. She is also not having as much pain as she was having last week. 10/13/18 unfortunately on evaluation today patient still continues to have a significant amount of drainage she's not letting home health actually apply the compression dressings at this point. She's trying to use of Juxta-Lite of the top of Kerlex and the second layer of the three layer compression wrap. With that being said she just does not seem to be making as good a progress as I would expect if she was having the compression applied and in place on a regular basis. No fevers, chills, nausea, or vomiting noted at this time. 10/20/18 on evaluation today patient appears to be doing a little better in regard to her bilateral lower extremity ulcers. In fact the right lower extremity seems to be healed she doesn't even have any openings at this point left lower extremity though still somewhat macerated seems to be showing signs of new skin growth at multiple locations throughout. Fortunately there's no evidence of active infection at this time. No fevers, chills, nausea, or vomiting noted at this time. 10/27/18 on evaluation today patient appears to be doing much better in regard to her left lower Trinity ulcer. She's been tolerating the laptop complication and has minimal drainage noted at this point. Fortunately there's no signs of active infection at this time. No fevers, chills, nausea, or vomiting noted at this time. 11/03/18 on evaluation today  patient actually appears to be doing excellent in regard to her left lower extremity. She is having very little drainage at this point there does not appear  to be any significant signs of infection overall very pleased with how things have gone. She is likewise extremely pleased still and seems to be making wonderful progress week to week. I do believe antibiotics were helpful for her. Her primary care provider did place on amateur clean since I last saw her. 11/17/18 on evaluation today patient appears to be doing worse in regard to her bilateral lower extremities at this point. She is been tolerating the dressing changes without complication. With that being said she typically takes the Coban off fairly quickly upon arriving home even after being seen here in the clinic and does not allow home health reapply command as part of the dressing at home. Therefore she said no compression essentially since I last saw her as best I can tell. With that being said I think it shows and how much swelling she has in the open wounds that are noted at this point. Fortunately there's no signs of infection but unfortunately if she doesn't get this under control I think she will end up with infection and more significant issues. 11/24/18 on evaluation today patient actually appears to be doing somewhat better in regard to her bilateral lower extremities. 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 Nonetheless that is doing better currently so she should be able to attempt reinitiating the lymphedema pumps at this time. No fevers, chills, nausea, or vomiting noted at this time. 12/01/18 upon evaluation today patient's left lower extremity appears to be doing quite well unfortunately her right lower extremity is not doing nearly as well. She has been tolerating the dressing changes without complication unfortunately she did not keep a wrap on the right at this time. Nonetheless I believe this has led to increased swelling and weeping in  the world is actually much larger than during the last evaluation with her. 12/08/18 on evaluation today patient appears to be doing about the same at this point in regard to her right lower extremity. There is some more palatable to touch I'm concerned about the possibility of there being some infection although I think the main issue is she's not keeping her compression wrap on which in turn is not allowing this area to heal appropriately. 12/22/18 on evaluation today patient appears to be doing better in regard to left lower extremity unfortunately significantly worse in regard to the right lower extremity. The areas of blistering and necrotic superficial tissue have spread and again this does not really appear to be signs of infection and all she just doesn't seem to be doing nearly as well is what she has been in the past. Overall I feel like the Augmentin did absolutely nothing for her she doesn't seem to have any infection again I really didn't think so last time either is more of a potential preventative measure and hoping that this would make some difference but I think the main issue is she's not wearing her compression. She tells me she cannot wear the [place] been we put on she takes it off pretty much upon getting home. Subsequently she worshiped Juxta-Lite when I questioned her about how often she wears it this is no  more than three hours a day obviously that leaves 21 hours that she has no compression and this is obviously not doing well for her. Overall I'm concerned that if things continue to worsen she is at great risk of both infection as well as losing her leg. 01/05/19 on evaluation today patient appears to be doing well in regard to her left lower extremity which he is allowing [place] to wrap and not so well with regard to her right lower extremity which she is not allowing [place] to really wrap and keep the wrap on. She states that it hurts too badly whenever it's wrapped and she ends up  having to take it off. She's been using the Juxta-Lite she tells me up to six hours a day although I question whether or not that's really been the case to be honest. Previously she told me three hours today nonetheless obviously the legs as long as the wrap is doing great when she is not is doing much more poorly. 01/12/2019 on evaluation today patient actually appears to be doing a little better in my opinion with regard to her right lower extremity ulcer. She has a small open area on the left lower extremity unfortunately but again this I think is part of the normal fluctuation of what she is going to have to expect with regard to her legs especially when she is not using her lymphedema pumps on a regular basis. Subsequently based on what I am seeing today I think that she does seem to be doing slightly better with regard to her right lower extremity she did see her primary care provider on Monday they felt she had an infection and placed her on 2 antibiotics. Both Cipro and clindamycin. Subsequently again she seems possibly to be doing a little bit better in regards to the right lower extremity she also tells me however she has been wearing the compression wrap over the past week since I spoke with her as well that is a Kerlix and Coban wrap on the right. No fevers, chills, nausea, vomiting, or diarrhea. 01/19/2019 on evaluation today patient appears to be doing better with regard to her bilateral lower extremities especially the right. I feel like the compression has been beneficial for her which is great news. She did get a call from her primary care provider on her way here today telling her that she did have methicillin- resistant Staphylococcus aureus and he was calling in a couple new antibiotics for her including a ointment to be applied she tells me 3 times a day. With that being said this sounds like likely to be Bactroban which I think could be applied with each dressing/wrap change but I  would not be able to accommodate her applying this 3 times a day. She is in agreement with the least doing this we will add that to her orders today. 01/26/2019 on evaluation today patient actually appears to be doing much better with regard to her right lower extremity. Her left lower extremity is also doing quite well all things considering. Fortunately there is no evidence of active infection at this time. No fevers, chills, nausea, vomiting, or diarrhea. 02/02/2019 on evaluation today patient appears to be doing much better compared to her last evaluation. Little by little off like her right leg is returning more towards normal. There does not appear to be any signs of active infection and overall she seems to be doing quite well which is great news. I am very pleased  in this regard. No fevers, chills, nausea, vomiting, or diarrhea. 02/09/2019 upon evaluation today patient appears to be doing better with regard to her bilateral lower extremities. She has been tolerating the dressing changes without complication. Fortunately there is no signs of active infection at this time. No fevers, chills, nausea, vomiting, or diarrhea. 02/23/2019 on evaluation today patient actually appears to be doing quite well with regard to her bilateral lower extremities. She has been tolerating the dressing changes without complication. She is even used her pumps one time and states that she really felt like it felt good. With that being said she seems to be in good spirits and her legs appear to be doing excellent. 03/09/2019 on evaluation today patient appears to be doing well with regard to her right lower extremity there are no open wounds at this time she is having some discomfort but I feel like this is more neuropathy than anything. With regard to her left lower extremity she had several areas scattered around that she does have some weeping and drainage from but again overall she does not appear to be having any  significant issues and no evidence of infection at this time which is good news. 03/23/2019 on evaluation today patient appears to be doing well with regard to her right lower extremity which she tells me is still close she is using her juxta light here. Her left lower extremity she mainly just has an area on the foot which is still slightly draining although this also is doing great. Overall very pleased at this time. 04/06/2019 patient appears to be doing a little bit worse in regard to her left lower extremity upon evaluation today. She feels like this could be becoming infected again which she had issues with previous. Fortunately there is no signs of systemic infection but again this is always a struggle with her with her legs she will go from doing well to not so well in a very short amount of time. 04/20/2019 on evaluation today patient actually appears to be doing quite well with regard to her right lower extremity I do not see any signs of active infection at this time. Fortunately there is no fever chills noted. She is still taking the antibiotics which I prescribed for her at this point. In regard to the left lower extremity I do feel like some of these areas are better although again she still is having weeping from several locations at this time. 04/27/2019 on evaluation today patient appears to be doing about the same if not slightly worse in regard to her left lower extremity ulcers. She tells me when questioned that she has been sleeping in her Hoveround chair in fact she tells me she falls asleep without even knowing it. I think she is spending a whole lot of time in the chair and less time walking and moving around which is not good for her legs either. On top of that she is in a seated position which is also the worst position she is not really elevating her legs and she is also not using her lymphedema pumps. All this is good to contribute to worsening of her condition in  general. 05/18/2019 on evaluation today patient appears to be doing well with regard to her lower extremity on the right in fact this is showing no signs of any open wounds at this time. On the left she is continuing to have issues with areas that do drain. Some of the regions have healed and  there are couple areas that have reopened. She did go to the ER per the patient according to recommendations from the home health nurse due to what she was seen when she came out on 05/13/2019. Subsequently she felt like the patient needed to go to the hospital due to the fact that again she was having "milky white discharge" from her leg. Nonetheless she had and then was placed on doxycycline and subsequently seems to be doing better. 06/01/2019 upon evaluation today patient appears to be doing really in my opinion about the same. I do not see any signs of active infection which is good news. Overall she still has wounds over the bilateral lower extremities she has reopened on the right but this appears to be more of a crack where there is weeping/edema coming from the region. I do not see any evidence of infection at either site based on what I visualized today. 07/13/2019 upon evaluation today patient appears to be doing a little worse compared to last time I saw her. She since has been in the hospital from 06/21/2019 through 06/29/2019. This was secondary to having Covid. During that time they did apply lotion to her legs which unfortunately has caused her to develop a myriad of open wounds on her lower extremities. Her legs do appear to be doing better as far as the overall appearance is concerned but nonetheless she does have more open and weeping areas. 07/27/2019 upon evaluation today patient appears to be doing more poorly to be honest in regard to her left lower extremity in particular. There is no signs of systemic infection although I do believe she may have local infection. She notes she has been having  a lot of blue/green drainage which is consistent potentially with Pseudomonas. That may be something that we need to consider here as well. The doxycycline does not seem to have been helping. 08/03/2019 upon evaluation today patient appears to be doing a little better in my opinion compared to last week's evaluation. Her culture I did review today and she is on appropriate medications to help treat the Enterobacter that was noted. Overall I feel like that is good news. With that being said she is unfortunately continuing to have a lot of drainage and though it is doing better I still think she has a long ways to go to get things dried up in general. Fortunately there is no signs of systemic infection. 08/10/2019 upon evaluation today patient appears to be doing may be slightly better in regard to her left lower extremity the right lower extremity is doing much better. Fortunately there is no signs of infection right now which is good news. No fevers, chills, nausea, vomiting, or diarrhea. 08/24/2019 on evaluation today patient appears to be doing slightly better in regard to her lower extremities. The left lower extremity seems to be healed the right lower extremity is doing better though not completely healed as far as the openings are concerned. She has some generalized issues here with edema and weeping secondary to her lymphedema though again I do believe this is little bit drier compared to prior weeks evaluations. In general I am very pleased with how things seem to be progressing. No fevers, chills, nausea, vomiting, or diarrhea. 08/31/2019 upon evaluation today patient actually seems to making some progress here with regard to the left lower extremity in particular. She has been tolerating the dressing changes without complication. Fortunately there is no signs of active infection at this time. No fevers,  chills, nausea, vomiting, or diarrhea. She did see Dr. Doren Custard and he did note that she did have  a issue with the left great saphenous vein and the small saphenous vein in the leg. With that being said he was concerned about the possibility of laser ablation not being extremely successful. He also mentioned a small risk of DVT associated with the procedure. However if the wounds do not continue to improve he stated that that would probably be the way to go. Fortunately the patient's legs do seem to be doing much better. 09/07/2019 upon evaluation today patient appears to be doing better with regard to her lower extremities. She has been tolerating the dressing changes without complication. With that being said she is showing signs of improvement and overall very pleased. There are some areas on her leg that I think we do need to debride we discussed this last week the patient is in agreement with doing that as long as it does not hurt too badly. 09/14/2019 upon evaluation today patient appears to be doing decently well with regard to her left lower extremity. She is not having near as much weeping as she has had in the past things seem to be drying up which is good news. There is no signs of active infection at this time. 09/21/19 upon evaluation today patient appears to be doing better in regard overall to her bilateral lower extremities. She again has less open than she did previous and each week I feel like this is getting better. Fortunately there is no signs of active infection at this time. No fevers, chills, nausea, vomiting, or diarrhea. 09/28/2019 upon evaluation today patient actually appears to be showing signs of improvement with regard to her left lower extremity. Unfortunately the right medial lower extremity around the ankle region has reopened to some degree but this appears to be minimal still which is good news. There is no signs of active infection at this time which is also good news. 10/12/2019 upon evaluation today patient appears to be doing okay with regard to her bilateral  lower extremities today. The right is a little bit worse then last evaluation 2 weeks ago. The left is actually doing a little better in my opinion. Overall there is no signs of active infection at this time that I see. Obviously that something we have to keep a close eye on she is very prone to this with the significant and multiple openings that she has over the bilateral lower extremities. 10/19/2019 upon evaluation today patient appears to be doing about the best that I have seen her in quite some time. She has been tolerating the dressing changes without complication. There does not appear to be any signs of active infection and overall I am extremely happy with the way her legs appeared. She is drying up quite nicely and overall is having less pain. 11/09/2019 upon evaluation today patient appears to be doing better in regard to her wounds. She seems to be drying up more and more each time I see her this is just taking a very long time. Fortunately there is no signs of active infection at this time. 11/23/2019 upon evaluation today patient actually appears to be doing excellent in regard to her lower extremities at this point compared to where she has been. Fortunately there is no signs of active infection at this time. She did go to the hospital last week for nausea and vomiting completely unrelated to her wounds. Fortunately she is doing better  she was given some Reglan and got better. She had associated abdominal pain but they never found out what was going on. 12/07/2019 upon evaluation today patient appears to be doing well for the most part in regard to her legs. She unfortunately has not been keeping the Coban portion of her wraps on therefore the compression has not really been sufficient for what it is supposed to be. Nonetheless she tells me that it just hurt too bad therefore she removed it. 12/21/2019 upon evaluation today patient actually appears to be doing quite well with regard to her  legs. I do feel like she has been making progress which is great news and overall there is no signs of active infection at this time. No fevers, chills, nausea, vomiting, or diarrhea. 01/04/2020 upon evaluation today patient presents for follow-up concerning her lower extremity edema bilaterally. She still has open wounds she has not been using her lymphedema pumps. She is also not been utilizing her compression wraps appropriately she tends to unwrap them, take them off, or states that they hurt. Obviously the reason they hurt is because her legs start to swell but the issue is if she would use her compression/lymphedema pumps regularly she would not swell and she would have the pain. Nonetheless she has not even picked them up once honestly over the past several months and may be even as much as in the past year based on my opinion and what have seen. She tells me today that after last week when I talked about this with her specifically actually that was 2 weeks ago that she "forgot". 8//21 on evaluation today patient appears to be doing a little better in regard to her legs bilaterally. Fortunately there is no signs of active infection at this time. She tells me that she used her lymphedema pumps all of one time over the past 2 weeks since I last saw her. She tells me that she has been too busy in order to continue to use these. 02/01/2020 on evaluation today patient appears to be doing some better in regard to her wounds in general in her legs. We felt the right was healed although is not completely it does appear to be doing better she tells me she has been using her lymphedema pumps that she has had this six times since I last saw her. Obviously the more she does that the better she would do my opinion 02/15/2020 upon evaluation today patient appears to be doing about the same in regard to her legs. She tells me that she is pumping I'm still not sure how much she does to be perfectly honest. However  even if she does a little bit here and there I guess that is better than nothing. Fortunately there is no sign of active infection at this time which is great news. No fevers, chills, nausea, vomiting, or diarrhea. 02/29/2020 on evaluation today patient actually appears to be doing quite well all things considered this week. She has been tolerating the dressing changes without complication. Fortunately there is no signs of active infection at this time. No fevers, chills, nausea, vomiting, or diarrhea. 03/14/2020 upon evaluation today patient appears to be doing really about the same in regard to her legs. There is no signs of improvement overall and she as noted from home health does not appear to be elevating her legs he can get into her lift chair. There is too much stuff piled up on it the patient tells me. She also  tells me she cannot really use her pumps effectively due to the fact that she cannot have any space to get them on. Finally she is also not really elevating her legs because she is not sleeping in her bed she is sleeping in her chair currently and again overall I think everything that she is done in combination has been exactly the wrong thing for what she needs for her legs. Electronic Signature(s) Signed: 03/14/2020 1:59:45 PM By: Lenda Kelp PA-C Entered By: Lenda Kelp on 03/14/2020 13:59:44 -------------------------------------------------------------------------------- Physical Exam Details Patient Name: Date of Service: RANDELL, DETTER 03/14/2020 10:15 A M Medical Record Number: [id number] Patient Account Number: [id number] Date of Birth/Sex: Treating RN: [birth year] (69 y.o. [name] Primary Care Provider: Fatima Sanger Other Clinician: Referring Provider: Treating Provider/Extender: Massie Bougie, FRED Weeks in Treatment: 136 Constitutional Obese and well-hydrated in no acute distress. Respiratory normal breathing without  difficulty. Psychiatric this patient is able to make decisions and demonstrates good insight into disease process. Alert and Oriented x 3. pleasant and cooperative. Notes Upon inspection patient still has significant edema as well as fluid draining from the left lower extremity especially to some degree the right. There does not appear to be any signs of active infection which is great news overall however I am concerned about the fact that she does not seem to be doing anything that she needs to do to try to keep her edema under control and thereby improve her function and her healing. Electronic Signature(s) Signed: 03/14/2020 2:00:09 PM By: Lenda Kelp PA-C Entered By: Lenda Kelp on 03/14/2020 14:00:09 -------------------------------------------------------------------------------- Physician Orders Details Patient Name: Date of Service: [name]. 03/14/2020 10:15 A M Medical Record Number: [id number] Patient Account Number: [id number] Date of Birth/Sex: Treating RN: [birth year] (69 y.o. [name] Primary Care Provider: Fatima Sanger Other Clinician: Referring Provider: Treating Provider/Extender: Massie Bougie, FRED Weeks in Treatment: [phone] Verbal / Phone Orders: No Diagnosis Coding ICD-10 Coding Code Description E11.622 Type 2 diabetes mellitus with other skin ulcer I89.0 Lymphedema, not elsewhere classified I87.331 Chronic venous hypertension (idiopathic) with ulcer and inflammation of right lower extremity I87.332 Chronic venous hypertension (idiopathic) with ulcer and inflammation of left lower extremity L97.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 leg with fat layer exposed L97.822 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 I10 Essential (primary) hypertension E66.01 Morbid (severe) obesity due to excess  calories F41.8 Other specified anxiety disorders R53.1 Weakness Follow-up Appointments Return Appointment in 2 weeks. Dressing Change Frequency Wound #61 Left,Circumferential Lower Leg Change dressing three times week. Wound #64 Left,Dorsal Foot Change dressing three times week. Wound #65 Right,Medial Lower Leg Change dressing three times week. Skin Barriers/Peri-Wound Care Moisturizing lotion - to dry skin Wound Cleansing Clean wound with Wound Cleanser - wash legs with soap and water with dressing changes May shower with protection. Primary Wound Dressing Wound #61 Left,Circumferential Lower Leg Calcium Alginate with Silver Wound #64 Left,Dorsal Foot Calcium Alginate with Silver Wound #65 Right,Medial Lower Leg Calcium Alginate with Silver Wound #66 Left,Anterior Lower Leg Calcium Alginate with Silver Secondary Dressing Wound #61 Left,Circumferential Lower Leg Dry Gauze ABD pad - as needed Wound #66 Left,Anterior Lower Leg Dry Gauze ABD pad - as needed Wound #64 Left,Dorsal Foot Dry Gauze ABD pad - as needed Wound #65 Right,Medial Lower Leg Dry Gauze  Edema Control 3 Layer Compression System - Bilateral void standing for long periods of time - walking is encouraged A Elevate legs to the level of the heart or above for 30 minutes daily and/or when sitting, a frequency of: - do not sleep in chair with feet dangling, MUST elevate legs in recliner while sitting Exercise regularly Segmental Compressive Device. - lymphedema pumps 60 minutes 1- 2 times per day Off-Loading Turn and reposition every 2 hours Additional Orders / Instructions Follow Nutritious Diet - T include vitamin A, vitamin C, and Zinc along with increased protein intake. o [facility] skilled [facility] for wound care. - [facility] Electronic Signature(s) Signed: 03/14/2020 5:36:27 PM By: [name] RN, BSN Signed: 03/14/2020 5:47:16 PM By: [name] PA-C Entered By:  [name] on 03/14/2020 11:23:29 -------------------------------------------------------------------------------- Problem List Details Patient Name: Date of Service: [name] 03/14/2020 10:15 A M Medical Record Number: [id number] Patient Account Number: [id number] Date of Birth/Sex: Treating RN: 26-Aug-1950 (69 y.o. [name] Primary Care Provider: [name] Other Clinician: Referring Provider: Treating Provider/Extender: Doyle Askew, FRED Weeks in Treatment: [phone] Active Problems ICD-10 Encounter Code Description Active Date MDM Diagnosis E11.622 Type 2 diabetes mellitus with other skin ulcer 08/05/2017 No Yes I89.0 Lymphedema, not elsewhere classified 08/05/2017 No Yes I87.331 Chronic venous hypertension (idiopathic) with ulcer and inflammation of right 08/05/2017 No Yes lower extremity I87.332 Chronic venous hypertension (idiopathic) with ulcer and inflammation of left 08/05/2017 No Yes lower extremity L97.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 leg with fat layer 08/05/2017 No Yes exposed L97.822 Non-pressure chronic ulcer of other part of left lower leg with fat layer exposed2/20/2019 No Yes L97.522 Non-pressure chronic ulcer of other part of left foot with fat layer exposed 06/01/2019 No Yes I10 Essential (primary) hypertension 08/05/2017 No Yes E66.01 Morbid (severe) obesity due to excess calories 08/05/2017 No Yes F41.8 Other specified anxiety disorders 08/05/2017 No Yes R53.1 Weakness 08/05/2017 No Yes Inactive Problems Resolved Problems Electronic Signature(s) Signed: 03/14/2020 10:42:12 AM By: [name] PA-C Entered By: [name] on 03/14/2020 10:42:10 -------------------------------------------------------------------------------- Progress Note Details Patient Name: Date of Service: [name] 03/14/2020 10:15 A M Medical Record Number: [id number] Patient Account Number: [id number] Date of Birth/Sex:  Treating RN: 04-26-1951 (69 y.o. [name] Primary Care Provider: [name] Other Clinician: Referring Provider: Treating Provider/Extender: Doyle Askew, FRED Weeks in Treatment: [phone] Subjective Chief Complaint Information obtained from Patient Bilateral reoccurring LE ulcers History of Present Illness (HPI) this patient has been seen a couple of times before and returns with recurrent problems to her right and left lower extremity with swelling and weeping ulcerations due to not wearing her compression stockings which she had been advised to do during her last discharge, at the end of June 2018. During her last visit the patient had had normal arterial blood flow and her venous reflux study did not necessitate any surgical intervention. She was recommended compression and elevation and wound care. After prolonged treatment the patient was completely healed but she has been noncompliant with wearing or compressions.. She was here last week with an outpatient return visit planned but the patient came in a very poor general condition with altered mental status and was rushed to the ER on my request. With a history of hypertension, diabetes, TIA and right-sided weakness she was set up for an MRI on her brain and cervical spine and was sent to [facility]. Getting an  MRI done was very difficult but once the workup was done she was found not to have any spinal stenosis, epidural abscess or hematoma or discitis. This was radiculopathy to be treated as an outpatient and she was given a follow-up appointment. Today she is feeling much better alert and oriented and has come to reevaluate her bilateral lower extremity lymphedema and ulceration 03/25/2017 -- she was admitted to the hospital on 03/16/2017 and discharged on 03/18/2017 with left leg cellulitis and ulceration. She was started on vancomycin and Zosyn and x-ray showed no bony involvement. She was treated for a cellulitis  with IV antibiotics changed to Rocephin and Flagyl and was discharged on oral Keflex and doxycycline to complete a 7 day course. Last hemoglobin A1c was 7.1 and her other ailments including hypertension got asthma were appropriately treated. 05/06/2017 -- she is awaiting the right size of compression stockings from [place] but other than that has been doing well. ====== Old notes 69 year old patient was seen one time last October and was lost to follow-up. She has recurrent problems with weeping and ulceration of her left lower extremity and has swelling of this for several years. It has been worse for the last 2 months. Past medical history is significant for diabetes mellitus type 2, hypertension, gout, morbid obesity, depressive disorders, hiatal hernia, migraines, status post knee surgery, risk of a cholecystectomy, vaginal hysterectomy and breast biopsy. She is not a smoker. As noted before she has never had a venous duplex study and an arterial ABI study was attempted but the left lower extremity was noncompressible 10/01/2016 -- had a lower extremity venous duplex reflux evaluation which showed no evidence of deep vein reflux in the right or left lower extremity, and no evidence of great saphenous vein reflux more than 500 ms in the right or left lower extremity, and the left small saphenous vein is incompetent but no vascular consult was recommended. review of her electronic medical records noted that the ABI was checked in July 2017 where the right ABI was normal limits and the left ABI could not be ascertained due to pain with cuff pressure but the waveforms are within normal limits. her arterial duplex study scheduled for April 27. 10/08/2016 -- the patient has various reasons for not having a compression on and for the last 3 days she has had no compression on her left lower extremity either due to pain or the lack of nursing help. She does not use her juxta lites either. 10/15/2016  -- the patient did not keep her appointment for arterial duplex study on April 27 and I have asked her to reschedule this. Her pain is out of proportion with the physical findings and she continuously fails to wear a compression wraps and cuts them off because she says she cannot tolerate the pain. She does not use her juxta lites either. 10/22/2016 -- he has rescheduled her arterial duplex study to May 21 and her pain today is a bit better. She has not been wearing her juxta lites on her right lower extremity but now understands that she needs to do this. She did tolerate the to press compression wrap on her left lower extremity 10/29/2016 --arterial duplex study is scheduled for next week and overall she has been tolerating her compression wraps and also using her juxta lites on her right lower extremity 11/05/2016 -- the right ABI was 0.95 the left was 1.03. The digit TBI is on the right was 0.83 on the left was 0.92 and  she had biphasic flow through these vessels. The impression was that of normal lower extremity arterial study. 11/12/2016 -- her pain is minimal and she is doing very well overall. 11/26/2016 -- she has got juxta lites and her insurance will not pay for additional dual layer compression stockings. She is going to order some from [employer]. 05/12/2017 -- her juxta lites are very old and too big for her and these have not been helping with compression. She did get 20-30 mm compression stockings from [employer] but she and her husband are unable to put these on. I believe she will benefit from bilateral Extremit-ease, compression stockings and we will measure her for these today. 05/20/2017 -- lymphedema on the left lower extremity has increased a lot and she has a open ulceration as a result of this. The right lower extremity is looking pretty good. She has decided to by the compression stockings herself and will get reimbursed by the home health, at a later date. 05/27/2017 -- her  sciatica is bothering her a lot and she thought her left leg pain was caused due to the compression wrap and hence removed it and has significant lymphedema. There is no inflammation on this left lower extremity. 06/17/17 on evaluation today patient appears to be doing very well and in fact is completely healed in regard to her ulcerations. Unfortunately however she does have continued issues with lymphedema nonetheless. We did order compression garments for her unfortunately she states that the size that she received were large although we ordered medium. Obviously this means she is not getting the optimal compression. She does not have those with her today and therefore we could not confirm and contact the company on her behalf. Nonetheless she does state that she is going to have her husband bring them by tomorrow so that we can verify and then get in touch with the company. No fevers, chills, nausea, or vomiting noted at this time. Overall patient is doing better otherwise and I'm pleased with the progress she has made. 07/01/17 on evaluation today patient appears to be doing very well in regard to her bilateral lower extremity she does not have any openings at this point which is excellent news. Overall I'm pleased with how things have progressed up to this time. Since she is doing so well we did order her compression which we are seeing her today to ensure that it fits her properly and everything is doing well in that regard and then subsequently she will be discharged. ============ Old Notes: 03/31/16 patient presents today for evaluation concerning open wounds that she has over the left medial ankle region as well as the left dorsal foot. She has previously had this occur although it has been healed for a number of months after having this for about a year prior until her hospitalization on 01/05/16. At that point in time it appears that she was admitted to the hospital for left lower extremity  cellulitis and was placed on vancomycin and Zosyn at that point. Eventually upon discharge on January 15, 2016 she was placed on doxycycline at that point in time. Later on 03/27/16 positive wound culture growing Escherichia coli this was switched to amoxicillin. Currently she tells me that she is having pain radiated to be a 7 out of 10 which can be as high as 10 out of 10 with palpation and manipulation of the wound. This wound appears to be mainly venous in nature due to the bilateral lower extremity venous stasis/lymphedema. This  is definitely much worse on her left than the right side. She does have type 1 diabetes mellitus, hypertension, morbid obesity, and is wheelchair dependent.during the course of the hospital stay a blood culture was also obtained and fortunately appeared negative. She also had an x-ray of the tibia/fibula on the left which showed no acute bone abnormality. Her white blood cell count which was performed last on 03/25/16 was 7.3, hemoglobin 12.8, protein 7.1, albumin 3.0. Her urine culture appeared to be negative for any specific organisms. Patient did have a left lower extremity venous duplex evaluation for DVT . This did not include venous reflux studies but fortunately was negative for DVT Patient also had arterial studies performed which revealed that she had a . normal ABI on the right though this was unable to be performed on the left secondary to pain that she was having around the ankle region due to the wound. However it was stated on report that she had biphasic pulses and apparently good blood flow. ========== 06/03/17 she is here in follow-up evaluation for right lower extremity ulcer. The right lower sure he has healed but she has reopened to the left medial malleolus and dorsal foot with weeping. She is waiting for new compression garments to arrive from home health, the previous compression garments were ill fitting. We will continue with compression bilaterally  and follow-up in 2 weeks Readmission: 08/05/17 on evaluation today patient appears to be doing somewhat poorly in regard to her left lower extremity especially although the right lower extremity has a small area which may no longer be open. She has been having a lot of drainage from the left lower extremity however he tells me that she has not been able to use the EXTREMIT-EASE Compression at this point. She states that she did better and was able to actually apply the Juxta-Lite compression although the wound that she has is too large and therefore really does not compress which is why she cannot wear it at this point. She has no one who can help her put it on regular basis her son can sometimes but he's not able to do it most of the time. I do believe that's why she has begun to weave and have issues as she is currently yet again. No fevers, chills, nausea, or vomiting noted at this time. Patient is no evidence of dementia. 08/12/17 on evaluation today patient appears to still be doing fairly well in regard to the draining areas/weeping areas at this point. With that being said she unfortunately did go to the ER yesterday due to what was felt to be possibly a cellulitis. They place her on doxycycline by mouth and discharge her home. She definitely was not admitted. With that being said she states she has had more discomfort which has been unusual for her even compared to prior times and she's had infections.08/12/17 on evaluation today patient appears to still be doing fairly well in regard to the draining areas/weeping areas at this point. With that being said she unfortunately did go to the ER yesterday due to what was felt to be possibly a cellulitis. They place her on doxycycline by mouth and discharge her home. She definitely was not admitted. With that being said she states she has had more discomfort which has been unusual for her even compared to prior times and she's had infections. 08/19/17 put  evaluation today patient tells me that she's been having a lot of what sounds to be neuropathic type pain  in regard to her left lower extremity. She has been using over-the-counter topical bins again which some believe. That in order to apply the she actually remove the wrap we put on her last Wednesday on Thursday. Subsequently she has not had anything on compression wise since that time. The good news is a lot of the weeping areas appear to have closed at this point again I believe she would do better with compression but we are struggling to get her to actually use what she needs to at this point. No fevers, chills, nausea, or vomiting noted at this time. 09/03/17 on evaluation today patient appears to be doing okay in regard to her lower extremities in regard to the lymphedema and weeping. Fortunately she does not seem to show any signs of infection at this point she does have a little bit of weeping occurring in the right medial malleolus area. With that being said this does not appear to be too significant which is good news. 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 She has severe skin damage secondary to both of these features involving the dorsal left foot and medial left ankle and lower leg. Still has open areas in the left anterior foot. The area on the right closed over. She uses her own juxta light stockings. She does not have an arterial issue 09/16/17 on evaluation today patient actually appears to be doing excellent in regard to her bilateral lower extremity swelling. The Juxta-Lite compression wrap seem to be doing very well for her. She has not however been using the portion that goes over her foot. Her left foot still is draining a little bit not nearly as significant as it has been in the past but still I do believe that she likely needs to utilize the full wrap including the foot portion of this will improve as  well. She also has been apparently putting on a significant amount of Vaseline which also think is not helpful for her. I recommended that if she feels she needs something for moisturizer Eucerin will probably be better. 09/30/17 on evaluation today patient presents with several new open areas in regard to her left lower extremity although these appear to be minimal and mainly seem to be more moisture breakdown than anything. Fortunately she does not seem to have any evidence of infection which is great news. She has been tolerating the dressing changes without complication we are using silver alginate on the foot she has been using AB pads to have the legs and using her Juxta- Lite compression which seems to be controlling her swelling very well. Overall I'm pleased with the poor way she has progressed. 10/14/17 on evaluation today patient appears to be doing better in regard to her left lower extremity areas of weeping. She does still have some discomfort although in general this does not appear to be as macerated and I think it is progressing nicely. I do think she still needs to wear the foot portion of her Juxta- Lite in order to get the most benefit from the wrap obviously. She states she understands. Fortunately there does not appear to be evidence of infection at this time which is great news. 10/28/17 on evaluation today patient appears to be doing excellent in regard to her left lower extremity. She has just a couple areas that are still open and seem to be causing any trouble whatsoever. For that reason I think that she is definitely headed  in the right direction the spots are very tiny compared to what we have been dealing with in the past. 11/11/17 on evaluation today patient appears to have a right lateral lower extremity ulcer that has opened since I last saw her. She states this is where the home health nurse that was coming out remove the dressing without wetting the alginate first.  Nonetheless I do not know if this is indeed the case or not but more importantly we have not ordered home help to be coming out for her wounds at all. I'm unsure as to why they are coming out and we're gonna have to check on this and get things situated in that regard. With that being said we currently really do not need them to be coming out as the patient has been taking care of her leg herself without complication and no issues. In fact she was doing much better prior to nursing coming out. 11/25/17 on evaluation today patient actually appears to be doing fairly well in regard to her left lower extremity swelling. In fact she has very little area of weeping at this point there's just a small spot on the lateral portion of her right leg that still has me just a little bit more concerned as far as wanting to see this clear up before I discharge her to caring for this at home. Nonetheless overall she has made excellent progress. 12/09/17 on evaluation today patient appears to be doing rather well in regard to her lower extremity edema. She does have some weeping still in the left lower extremity although the big area we were taking care of two weeks ago actually has closed and she has another area of weeping on the left lower extremity immediately as well is the top of her foot. She does not currently have lymphedema pumps she has been wearing her compression daily on a regular basis as directed. With that being said I think she may benefit from lymphedema pumps. 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 We had a very difficult time getting and keeping this under control. 12/23/17 on evaluation today patient actually appears to be doing a little bit more poorly in regard to her bilateral lower extremities. She has been tolerating the Juxta-Lite  compression wraps. Unfortunately she has two new ulcers on the right lower extremity and left lower Trinity ulceration seems to be larger. Obviously this is not good news. She has been tolerating the dressings without complication. 12/30/17 on evaluation today patient actually appears to be doing much better in regard to her bilateral lower extremity edema. She continues to have some issues with ulcerations and in fact there appears to be one spot on each leg where the wrap may have caused a little bit of a blister which is subsequently opened up at this point is given her pain. Fortunately it does not appear to be any evidence of infection which is good news. No fevers chills noted. 01/13/18 on evaluation today patient appears to be doing rather well in regard to her bilateral lower extremities. The dressings did get kind of stuck as far as the wound beds are concerned but again I think this is mainly due to the fact that she actually seems to be showing signs of healing which is good news. She's not having as much drainage therefore she  was having more of the dressing sticking. Nonetheless overall I feel like her swelling is dramatically down compared to previous. 01/20/18 on evaluation today patient unfortunately though she's doing better in most regards has a large blister on the left anterior lower extremity where she is draining quite significantly. Subsequently this is going to need debridement today in order to see what's underneath and ensure she does not continue to trapping fluid at this location. Nonetheless No fevers, chills, nausea, or vomiting noted at this time. 01/27/18 on evaluation today patient appears to be doing rather well at this point in regard to her right lower extremity there's just a very small area that she still has open at this point. With that being said I do believe that she is tolerating the compression wraps very well in making good progress. Home health is coming out at  this point to see her. Her left lower extremity on the lateral portion is actually what still mainly open and causing her some discomfort for the most part 02/10/18 on evaluation today patient actually appears to be doing very well in regard to her right lower extremity were all the ulcers appear to be completely close. In regard to the left lower extremity she does have two areas still open and some leaking from the dorsal surface of her foot but this still seems to be doing much better to me in general. 02/24/18 on evaluation today patient actually appears to be doing much better in regard to her right lower extremity this is still completely healed. Her left lower extremity is also doing much better fortunately she has no evidence of infection. The one area that is gonna require some debridement is still on the left anterior shin. Fortunately this is not hurting her as badly today. 03/10/18 on evaluation today patient appears to be doing better in some regards although she has a little bit more open area on the dorsal foot and she also has some issues on the medial portion of the left lower extremity which is actually new and somewhat deep. With that being said there fortunately does not appear to be any significant signs of infection which is good news. No fevers, chills, nausea, or vomiting noted at this time. In general her swelling seems to be doing fairly well which is good news. 03/31/18 on evaluation today patient presents for follow-up concerning her left lower extremity lymphedema. Unfortunately she has been doing a little bit more poorly since I last saw her in regard to the amount of weeping that she is experiencing. She's also having some increased pain in the anterior shin location. Unfortunately I do not feel like the patient is making such good progress at this point a few weeks back she was definitely doing much better. 04/07/18 on evaluation today patient actually appears to be showing  some signs of improvement as far as the left lower extremity is concerned. She has been tolerating the dressing changes and it does appear that the Drawtex did better for her. With that being said unfortunately home health is stating that they cannot obtain the Drawtex going forward. Nonetheless we're gonna have to check and see what they may be able to get the alginate they were using was getting stuck in causing new areas of skin being pulled all that with and subsequently weep and calls her to worsen overall this is the first time we've seen improvement at this time. 04/14/18 on evaluation today patient actually appears to be doing rather well  at this point there does not appear to be any evidence of infection at this time and she is actually doing excellent in regard to the weeping in fact she almost has no openings remaining even compared to just last week this is a dramatic improvement. No fevers chills noted 04/21/18 evaluation today patient actually appears to be doing very well. She in fact is has a small area on the posterior lower extremity location and she has a small area on the dorsal surface of her foot that are still open both of which are very close to closing. We're hoping this will be close shortly. She brought her Juxta-Lite wrap with her today hoping that would be able to put her in it unfortunately I don't think were quite at that point yet but we're getting closer. 04/28/18 upon evaluation today patient actually appears to be doing excellent in regard to her left lower extremity ulcer. In fact the region on the posterior lower extremity actually is much smaller than previously noted. Overall I'm very happy with the progress she has made. She again did bring her Juxta-Lite although we're not quite ready for that yet. 05/11/18 upon evaluation today patient actually appears to be doing in general fairly well in regard to her left lower Trinity. The swelling is very well  controlled. With that being said she has a new area on the left anterior lower extremity as well as between the first and second toes of her left foot that was not present during the last evaluation. The region of her posterior left lower extremity actually appears to be almost completely healed. T be honest I'm very pleased with o the way that stands. Nonetheless I do believe that the lotion may be keeping the area to moist as far as her legs are concerned subsequently I'm gonna consider discontinuing that today. 05/26/18 on evaluation today patient appears to be doing rather well in regard to her left lower should be ulcers. In fact everything appears to be close except for a very small area on the left posterior lower extremity. Fortunately there does not appear to be any evidence of infection at this time. Overall very pleased with her progress. 06/02/18 and evaluation today patient actually appears to be doing very well in regard to her lower extremity ulcers. She has one small area that still continues to weep that I think may benefit her being able to justify lotion and user Juxta-Lite wraps versus continued to wrap her. Nonetheless I think this is something we can definitely look into at this point. 06/23/18 on evaluation today patient unfortunately has openings of her bilateral lower extremities. In general she seems to be doing much worse than when I last saw her just as far as her overall health standpoint is concerned. She states that her discomfort is mainly due to neuropathy she's not having any other issues otherwise. No fevers, chills, nausea, or vomiting noted at this time. 06/30/18 on evaluation today patient actually appears to be doing a little worse in regard to her right lower extremity her left lower extremity of doing fairly well. Fortunately there is no sign of infection at this time. She has been tolerating the dressing changes without complication. Home health did not come out  like they were supposed to for the appropriate wrap changes. They stated that they never received the orders from [place] which were fax. Nonetheless we will send a copy of the orders with the patient today as well. 07/07/18 on evaluation today  patient appears to be doing much better in regard to lower extremities. She still has several openings bilaterally although since I last saw her her legs did show obvious signs of infection when she later saw her nurse. Subsequently a culture was obtained and she is been placed on Bactrim and Keflex. Fortunately things seem to be looking much better it does appear she likely had an infection. Again last week we'd even discussed it but again there really was not any obvious sign that she had infection therefore we held off on the antibiotics. Nonetheless I'm glad she's doing better today. 07/14/18 on evaluation today patient appears to be doing much better regarding her bilateral lower Trinity's. In fact on the right lower for me there's nothing open at this point there are some dry skin areas at the sites where she had infection. Fortunately there is no evidence of systemic infection which is excellent news. No fevers chills noted 07/21/18 on evaluation today patient actually appears to be doing much better in regard to her left lower extremity ulcers. She is making good progress and overall I feel like she's improving each time I see her. She's having no pain I do feel like the infection is completely resolved which is excellent news. No fevers, chills, nausea, or vomiting noted at this time. 07/28/18 on evaluation today patient appears to be doing very well in regard to her left lower [employer]. Everything seems to be showing signs of improvement which is excellent news. Overall very pleased with the progress that has been made. Fortunately there's no evidence of active infection at this time also excellent news. 08/04/18 on evaluation today patient appears to be  doing more poorly in regard to her bilateral lower extremities. She has two new areas open up on the right and these were completely closed as of last week. She still has the two spots on the left which in my pinion seem to be doing better. Fortunately there's no evidence of infection again at this point. 08/11/18 on evaluation today patient actually appears to be doing very well in regard to her bilateral lower Trinity wounds that all seem to be doing better and are measures smaller today. Fortunately there's no signs of infection. No fevers, chills, nausea, or vomiting noted at this time. 08/18/18 on evaluation today patient actually appears to be doing about the same inverter bilateral lower extremities. She continues to have areas that blistering open as was drain that fortunately nothing too significant. Overall I feel like Drawtex may have done better for her however compared to the collagen. 08/25/18 on evaluation today patient appears to be doing a little bit more poorly today even compared to last time I saw her. Again I'm not exactly sure why she's making worse progress over the past several weeks. I'm beginning to wonder if there is some kind of underlying low level infection causing this issue. I did actually take a culture from the left anterior lower extremity but it was a new wound draining quite a bit at this point. Unfortunately she also seems to be having more pain which is what also makes me worried about the possibility of infection. This is despite never erythema noted at this point. 09/01/18 on evaluation today patient actually appears to be doing a little worse even compared to last week in regard to bilateral lower extremities. She did go to the hospital on the 16th was given a dose of IV Zosyn and then discharged with a recommendation to continue  with the Bactrim that I previously prescribed for her. Nonetheless she is still having a lot of discomfort she tells me as well at this  time. This is definitely unfortunate. No fevers, chills, nausea, or vomiting noted at this time. 09/08/18 on evaluation today patient's bilateral lower extremities actually appear to be shown signs of improvement which is good news. Fortunately there does not appear to be any signs of active infection I think the anabiotic is helping in this regard. Overall I'm very pleased with how she is progressing. 09/15/18 patient was actually seen in ER yesterday due to her legs as well unfortunately. She states she's been having a lot of pain and discomfort as well as a lot of drainage. Upon inspection today the patient does have a lot of swelling and drainage I feel like this is more related to lymphedema and poor fluid control than it is to infection based on what I'm seeing. The physician in the emergency department also doubted that the patient was having a significant infection nonetheless I see no evidence of infection obvious at this point although I do see evidence of poor fluid control. She still not using a compression pumps, she is not elevating due to her lift chair as well as her hospital bed being broken, and she really is not keeping her legs up as much as they should be and also has been taking off her wraps. All this combined I think has led to poor fluid control and to be honest she may be somewhat volume overloaded in general as well. I recommend that she may need to contact your physician to see if a prescription for a diuretic would be beneficial in their opinion. As long as this is safe I think it would likely help her. 09/29/18 on evaluation today patient's left lower extremity actually appears to be doing quite a bit better. At least compared to last time that I saw her. She still has a large area where she is draining from but there's a lot of new skin speckled trout and in fact there's more new skin that there are open areas of weeping and drainage at this point. This is good news. With  regard to the right lower extremity this is doing much better with the only open area that I really see being a dry spot on the right lateral ankle currently. Fortunately there's no signs of active infection at this time which is good news. No fevers, chills, nausea, or vomiting noted at this time. The patient seems somewhat stressed and overwhelmed during the visit today she was very lethargic as such. She does and she is not taking any pain medications at this point. Apparently according to her husband are also in the process of moving which is probably taking its toll on her as well. 10/06/18 on evaluation today patient appears to be doing rather well in regard to her lower extremities compared to last evaluation. Fortunately there's no signs of active infection. She tells me she did have an appointment with her primary. Nonetheless he was concerned that the wounds were somewhat deep based on pictures but we never actually saw her legs. She states that he had her somewhat worried due to the fact that she was fearing now that she was [place] have to have an amputation. With that being said based on what I'm seeing check she looks better this week that she has the last two times I've seen her with much less drainage I'm actually pleased  in this regard. That doesn't mean that she's out of the water but again I do not think what the point of talking about education at all in regard to her leg. She is very happy to hear this. She is also not having as much pain as she was having last week. 10/13/18 unfortunately on evaluation today patient still continues to have a significant amount of drainage she's not letting home health actually apply the compression dressings at this point. She's trying to use of Juxta-Lite of the top of Kerlex and the second layer of the three layer compression wrap. With that being said she just does not seem to be making as good a progress as I would expect if she was having the  compression applied and in place on a regular basis. No fevers, chills, nausea, or vomiting noted at this time. 10/20/18 on evaluation today patient appears to be doing a little better in regard to her bilateral lower extremity ulcers. In fact the right lower extremity seems to be healed she doesn't even have any openings at this point left lower extremity though still somewhat macerated seems to be showing signs of new skin growth at multiple locations throughout. Fortunately there's no evidence of active infection at this time. No fevers, chills, nausea, or vomiting noted at this time. 10/27/18 on evaluation today patient appears to be doing much better in regard to her left lower Trinity ulcer. She's been tolerating the laptop complication and has minimal drainage noted at this point. Fortunately there's no signs of active infection at this time. No fevers, chills, nausea, or vomiting noted at this time. 11/03/18 on evaluation today patient actually appears to be doing excellent in regard to her left lower extremity. She is having very little drainage at this point there does not appear to be any significant signs of infection overall very pleased with how things have gone. She is likewise extremely pleased still and seems to be making wonderful progress week to week. I do believe antibiotics were helpful for her. Her primary care provider did place on amateur clean since I last saw her. 11/17/18 on evaluation today patient appears to be doing worse in regard to her bilateral lower extremities at this point. She is been tolerating the dressing changes without complication. With that being said she typically takes the Coban off fairly quickly upon arriving home even after being seen here in the clinic and does not allow home health reapply command as part of the dressing at home. Therefore she said no compression essentially since I last saw her as best I can tell. With that being said I think it shows  and how much swelling she has in the open wounds that are noted at this point. Fortunately there's no signs of infection but unfortunately if she doesn't get this under control I think she will end up with infection and more significant issues. 11/24/18 on evaluation today patient actually appears to be doing somewhat better in regard to her bilateral lower extremities. 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 Nonetheless that is doing better currently so she should be able to attempt reinitiating the lymphedema pumps at this time. No fevers, chills, nausea, or vomiting noted at this time. 12/01/18 upon evaluation today patient's left lower extremity appears to be doing quite well  unfortunately her right lower extremity is not doing nearly as well. She has been tolerating the dressing changes without complication unfortunately she did not keep a wrap on the right at this time. Nonetheless I believe this has led to increased swelling and weeping in the world is actually much larger than during the last evaluation with her. 12/08/18 on evaluation today patient appears to be doing about the same at this point in regard to her right lower extremity. There is some more palatable to touch I'm concerned about the possibility of there being some infection although I think the main issue is she's not keeping her compression wrap on which in turn is not allowing this area to heal appropriately. 12/22/18 on evaluation today patient appears to be doing better in regard to left lower extremity unfortunately significantly worse in regard to the right lower extremity. The areas of blistering and necrotic superficial tissue have spread and again this does not really appear to be signs of infection and all she just doesn't seem to be doing nearly as well is what she has been in  the past. Overall I feel like the Augmentin did absolutely nothing for her she doesn't seem to have any infection again I really didn't think so last time either is more of a potential preventative measure and hoping that this would make some difference but I think the main issue is she's not wearing her compression. She tells me she cannot wear the [place] been we put on she takes it off pretty much upon getting home. Subsequently she worshiped Juxta-Lite when I questioned her about how often she wears it this is no more than three hours a day obviously that leaves 21 hours that she has no compression and this is obviously not doing well for her. Overall I'm concerned that if things continue to worsen she is at great risk of both infection as well as losing her leg. 01/05/19 on evaluation today patient appears to be doing well in regard to her left lower extremity which he is allowing [place] to wrap and not so well with regard to her right lower extremity which she is not allowing [place] to really wrap and keep the wrap on. She states that it hurts too badly whenever it's wrapped and she ends up having to take it off. She's been using the Juxta-Lite she tells me up to six hours a day although I question whether or not that's really been the case to be honest. Previously she told me three hours today nonetheless obviously the legs as long as the wrap is doing great when she is not is doing much more poorly. 01/12/2019 on evaluation today patient actually appears to be doing a little better in my opinion with regard to her right lower extremity ulcer. She has a small open area on the left lower extremity unfortunately but again this I think is part of the normal fluctuation of what she is going to have to expect with regard to her legs especially when she is not using her lymphedema pumps on a regular basis. Subsequently based on what I am seeing today I think that she does seem to be doing slightly better with  regard to her right lower extremity she did see her primary care provider on Monday they felt she had an infection and placed her on 2 antibiotics. Both Cipro and clindamycin. Subsequently again she seems possibly to be doing a little bit better in regards to the right lower extremity  she also tells me however she has been wearing the compression wrap over the past week since I spoke with her as well that is a Kerlix and Coban wrap on the right. No fevers, chills, nausea, vomiting, or diarrhea. 01/19/2019 on evaluation today patient appears to be doing better with regard to her bilateral lower extremities especially the right. I feel like the compression has been beneficial for her which is great news. She did get a call from her primary care provider on her way here today telling her that she did have methicillin- resistant Staphylococcus aureus and he was calling in a couple new antibiotics for her including a ointment to be applied she tells me 3 times a day. With that being said this sounds like likely to be Bactroban which I think could be applied with each dressing/wrap change but I would not be able to accommodate her applying this 3 times a day. She is in agreement with the least doing this we will add that to her orders today. 01/26/2019 on evaluation today patient actually appears to be doing much better with regard to her right lower extremity. Her left lower extremity is also doing quite well all things considering. Fortunately there is no evidence of active infection at this time. No fevers, chills, nausea, vomiting, or diarrhea. 02/02/2019 on evaluation today patient appears to be doing much better compared to her last evaluation. Little by little off like her right leg is returning more towards normal. There does not appear to be any signs of active infection and overall she seems to be doing quite well which is great news. I am very pleased in this regard. No fevers, chills, nausea,  vomiting, or diarrhea. 02/09/2019 upon evaluation today patient appears to be doing better with regard to her bilateral lower extremities. She has been tolerating the dressing changes without complication. Fortunately there is no signs of active infection at this time. No fevers, chills, nausea, vomiting, or diarrhea. 02/23/2019 on evaluation today patient actually appears to be doing quite well with regard to her bilateral lower extremities. She has been tolerating the dressing changes without complication. She is even used her pumps one time and states that she really felt like it felt good. With that being said she seems to be in good spirits and her legs appear to be doing excellent. 03/09/2019 on evaluation today patient appears to be doing well with regard to her right lower extremity there are no open wounds at this time she is having some discomfort but I feel like this is more neuropathy than anything. With regard to her left lower extremity she had several areas scattered around that she does have some weeping and drainage from but again overall she does not appear to be having any significant issues and no evidence of infection at this time which is good news. 03/23/2019 on evaluation today patient appears to be doing well with regard to her right lower extremity which she tells me is still close she is using her juxta light here. Her left lower extremity she mainly just has an area on the foot which is still slightly draining although this also is doing great. Overall very pleased at this time. 04/06/2019 patient appears to be doing a little bit worse in regard to her left lower extremity upon evaluation today. She feels like this could be becoming infected again which she had issues with previous. Fortunately there is no signs of systemic infection but again this is always a struggle  with her with her legs she will go from doing well to not so well in a very short amount of time. 04/20/2019 on  evaluation today patient actually appears to be doing quite well with regard to her right lower extremity I do not see any signs of active infection at this time. Fortunately there is no fever chills noted. She is still taking the antibiotics which I prescribed for her at this point. In regard to the left lower extremity I do feel like some of these areas are better although again she still is having weeping from several locations at this time. 04/27/2019 on evaluation today patient appears to be doing about the same if not slightly worse in regard to her left lower extremity ulcers. She tells me when questioned that she has been sleeping in her Hoveround chair in fact she tells me she falls asleep without even knowing it. I think she is spending a whole lot of time in the chair and less time walking and moving around which is not good for her legs either. On top of that she is in a seated position which is also the worst position she is not really elevating her legs and she is also not using her lymphedema pumps. All this is good to contribute to worsening of her condition in general. 05/18/2019 on evaluation today patient appears to be doing well with regard to her lower extremity on the right in fact this is showing no signs of any open wounds at this time. On the left she is continuing to have issues with areas that do drain. Some of the regions have healed and there are couple areas that have reopened. She did go to the ER per the patient according to recommendations from the home health nurse due to what she was seen when she came out on 05/13/2019. Subsequently she felt like the patient needed to go to the hospital due to the fact that again she was having "milky white discharge" from her leg. Nonetheless she had and then was placed on doxycycline and subsequently seems to be doing better. 06/01/2019 upon evaluation today patient appears to be doing really in my opinion about the same. I do not  see any signs of active infection which is good news. Overall she still has wounds over the bilateral lower extremities she has reopened on the right but this appears to be more of a crack where there is weeping/edema coming from the region. I do not see any evidence of infection at either site based on what I visualized today. 07/13/2019 upon evaluation today patient appears to be doing a little worse compared to last time I saw her. She since has been in the hospital from 06/21/2019 through 06/29/2019. This was secondary to having Covid. During that time they did apply lotion to her legs which unfortunately has caused her to develop a myriad of open wounds on her lower extremities. Her legs do appear to be doing better as far as the overall appearance is concerned but nonetheless she does have more open and weeping areas. 07/27/2019 upon evaluation today patient appears to be doing more poorly to be honest in regard to her left lower extremity in particular. There is no signs of systemic infection although I do believe she may have local infection. She notes she has been having a lot of blue/green drainage which is consistent potentially with Pseudomonas. That may be something that we need to consider here as well. The doxycycline does  not seem to have been helping. 08/03/2019 upon evaluation today patient appears to be doing a little better in my opinion compared to last week's evaluation. Her culture I did review today and she is on appropriate medications to help treat the Enterobacter that was noted. Overall I feel like that is good news. With that being said she is unfortunately continuing to have a lot of drainage and though it is doing better I still think she has a long ways to go to get things dried up in general. Fortunately there is no signs of systemic infection. 08/10/2019 upon evaluation today patient appears to be doing may be slightly better in regard to her left lower extremity the  right lower extremity is doing much better. Fortunately there is no signs of infection right now which is good news. No fevers, chills, nausea, vomiting, or diarrhea. 08/24/2019 on evaluation today patient appears to be doing slightly better in regard to her lower extremities. The left lower extremity seems to be healed the right lower extremity is doing better though not completely healed as far as the openings are concerned. She has some generalized issues here with edema and weeping secondary to her lymphedema though again I do believe this is little bit drier compared to prior weeks evaluations. In general I am very pleased with how things seem to be progressing. No fevers, chills, nausea, vomiting, or diarrhea. 08/31/2019 upon evaluation today patient actually seems to making some progress here with regard to the left lower extremity in particular. She has been tolerating the dressing changes without complication. Fortunately there is no signs of active infection at this time. No fevers, chills, nausea, vomiting, or diarrhea. She did see Dr. Doren Custard and he did note that she did have a issue with the left great saphenous vein and the small saphenous vein in the leg. With that being said he was concerned about the possibility of laser ablation not being extremely successful. He also mentioned a small risk of DVT associated with the procedure. However if the wounds do not continue to improve he stated that that would probably be the way to go. Fortunately the patient's legs do seem to be doing much better. 09/07/2019 upon evaluation today patient appears to be doing better with regard to her lower extremities. She has been tolerating the dressing changes without complication. With that being said she is showing signs of improvement and overall very pleased. There are some areas on her leg that I think we do need to debride we discussed this last week the patient is in agreement with doing that as long as  it does not hurt too badly. 09/14/2019 upon evaluation today patient appears to be doing decently well with regard to her left lower extremity. She is not having near as much weeping as she has had in the past things seem to be drying up which is good news. There is no signs of active infection at this time. 09/21/19 upon evaluation today patient appears to be doing better in regard overall to her bilateral lower extremities. She again has less open than she did previous and each week I feel like this is getting better. Fortunately there is no signs of active infection at this time. No fevers, chills, nausea, vomiting, or diarrhea. 09/28/2019 upon evaluation today patient actually appears to be showing signs of improvement with regard to her left lower extremity. Unfortunately the right medial lower extremity around the ankle region has reopened to some degree but this appears  to be minimal still which is good news. There is no signs of active infection at this time which is also good news. 10/12/2019 upon evaluation today patient appears to be doing okay with regard to her bilateral lower extremities today. The right is a little bit worse then last evaluation 2 weeks ago. The left is actually doing a little better in my opinion. Overall there is no signs of active infection at this time that I see. Obviously that something we have to keep a close eye on she is very prone to this with the significant and multiple openings that she has over the bilateral lower extremities. 10/19/2019 upon evaluation today patient appears to be doing about the best that I have seen her in quite some time. She has been tolerating the dressing changes without complication. There does not appear to be any signs of active infection and overall I am extremely happy with the way her legs appeared. She is drying up quite nicely and overall is having less pain. 11/09/2019 upon evaluation today patient appears to be doing better in  regard to her wounds. She seems to be drying up more and more each time I see her this is just taking a very long time. Fortunately there is no signs of active infection at this time. 11/23/2019 upon evaluation today patient actually appears to be doing excellent in regard to her lower extremities at this point compared to where she has been. Fortunately there is no signs of active infection at this time. She did go to the hospital last week for nausea and vomiting completely unrelated to her wounds. Fortunately she is doing better she was given some Reglan and got better. She had associated abdominal pain but they never found out what was going on. 12/07/2019 upon evaluation today patient appears to be doing well for the most part in regard to her legs. She unfortunately has not been keeping the Coban portion of her wraps on therefore the compression has not really been sufficient for what it is supposed to be. Nonetheless she tells me that it just hurt too bad therefore she removed it. 12/21/2019 upon evaluation today patient actually appears to be doing quite well with regard to her legs. I do feel like she has been making progress which is great news and overall there is no signs of active infection at this time. No fevers, chills, nausea, vomiting, or diarrhea. 01/04/2020 upon evaluation today patient presents for follow-up concerning her lower extremity edema bilaterally. She still has open wounds she has not been using her lymphedema pumps. She is also not been utilizing her compression wraps appropriately she tends to unwrap them, take them off, or states that they hurt. Obviously the reason they hurt is because her legs start to swell but the issue is if she would use her compression/lymphedema pumps regularly she would not swell and she would have the pain. Nonetheless she has not even picked them up once honestly over the past several months and may be even as much as in the past year based on my  opinion and what have seen. She tells me today that after last week when I talked about this with her specifically actually that was 2 weeks ago that she "forgot". 8//21 on evaluation today patient appears to be doing a little better in regard to her legs bilaterally. Fortunately there is no signs of active infection at this time. She tells me that she used her lymphedema pumps all of one  time over the past 2 weeks since I last saw her. She tells me that she has been too busy in order to continue to use these. 02/01/2020 on evaluation today patient appears to be doing some better in regard to her wounds in general in her legs. We felt the right was healed although is not completely it does appear to be doing better she tells me she has been using her lymphedema pumps that she has had this six times since I last saw her. Obviously the more she does that the better she would do my opinion 02/15/2020 upon evaluation today patient appears to be doing about the same in regard to her legs. She tells me that she is pumping I'm still not sure how much she does to be perfectly honest. However even if she does a little bit here and there I guess that is better than nothing. Fortunately there is no sign of active infection at this time which is great news. No fevers, chills, nausea, vomiting, or diarrhea. 02/29/2020 on evaluation today patient actually appears to be doing quite well all things considered this week. She has been tolerating the dressing changes without complication. Fortunately there is no signs of active infection at this time. No fevers, chills, nausea, vomiting, or diarrhea. 03/14/2020 upon evaluation today patient appears to be doing really about the same in regard to her legs. There is no signs of improvement overall and she as noted from home health does not appear to be elevating her legs he can get into her lift chair. There is too much stuff piled up on it the patient tells me. She also tells  me she cannot really use her pumps effectively due to the fact that she cannot have any space to get them on. Finally she is also not really elevating her legs because she is not sleeping in her bed she is sleeping in her chair currently and again overall I think everything that she is done in combination has been exactly the wrong thing for what she needs for her legs. Objective Constitutional Obese and well-hydrated in no acute distress. Vitals Time Taken: 10:32 AM, Height: 62 in, Weight: 335 lbs, BMI: 61.3, Temperature: 97.8 F, Pulse: 79 bpm, Respiratory Rate: 18 breaths/min, Blood Pressure: 123/77 mmHg. Respiratory normal breathing without difficulty. Psychiatric this patient is able to make decisions and demonstrates good insight into disease process. Alert and Oriented x 3. pleasant and cooperative. General Notes: Upon inspection patient still has significant edema as well as fluid draining from the left lower extremity especially to some degree the right. There does not appear to be any signs of active infection which is great news overall however I am concerned about the fact that she does not seem to be doing anything that she needs to do to try to keep her edema under control and thereby improve her function and her healing. Integumentary (Hair, Skin) Wound #61 status is Open. Original cause of wound was Gradually Appeared. The wound is located on the Left,Circumferential Lower Leg. The wound measures 16cm length x 30.5cm width x 0.1cm depth; 383.274cm^2 area and 38.327cm^3 volume. There is Fat Layer (Subcutaneous Tissue) exposed. There is no tunneling or undermining noted. There is a large amount of serous drainage noted. The wound margin is flat and intact. There is medium (34-66%) pink, pale granulation within the wound bed. There is a medium (34-66%) amount of necrotic tissue within the wound bed including Adherent Slough. Wound #64 status is Open. Original  cause of wound was  Gradually Appeared. The wound is located on the Left,Dorsal Foot. The wound measures 7cm length x 7.5cm width x 0.1cm depth; 41.233cm^2 area and 4.123cm^3 volume. There is Fat Layer (Subcutaneous Tissue) exposed. There is no tunneling or undermining noted. There is a medium amount of serous drainage noted. The wound margin is flat and intact. There is large (67-100%) pink, pale granulation within the wound bed. There is a small (1-33%) amount of necrotic tissue within the wound bed including Adherent Slough. Wound #65 status is Open. Original cause of wound was Gradually Appeared. The wound is located on the Right,Medial Lower Leg. The wound measures 2cm length x 1.1cm width x 0.1cm depth; 1.728cm^2 area and 0.173cm^3 volume. There is Fat Layer (Subcutaneous Tissue) exposed. There is no tunneling or undermining noted. There is a medium amount of serosanguineous drainage noted. The wound margin is distinct with the outline attached to the wound base. There is large (67-100%) red granulation within the wound bed. There is no necrotic tissue within the wound bed. Wound #66 status is Open. Original cause of wound was Gradually Appeared. The wound is located on the Left,Anterior Lower Leg. The wound measures 0.5cm length x 0.5cm width x 0.1cm depth; 0.196cm^2 area and 0.02cm^3 volume. There is Fat Layer (Subcutaneous Tissue) exposed. There is no tunneling or undermining noted. There is a medium amount of serosanguineous drainage noted. There is medium (34-66%) pink, pale granulation within the wound bed. There is a medium (34-66%) amount of necrotic tissue within the wound bed including Adherent Slough. 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 There was a Three Layer Compression Therapy Procedure by Carlene Coria, RN. Post procedure Diagnosis Wound #61: Same as Pre-Procedure Wound #65 Pre-procedure diagnosis of Wound #65 is a Venous Leg Ulcer located on the Right,Medial Lower Leg . There was a Three Layer Compression Therapy Procedure by Carlene Coria, RN. Post procedure Diagnosis Wound #65: Same as Pre-Procedure Plan Follow-up Appointments: Return Appointment in 2 weeks. Dressing Change Frequency: Wound #61 Left,Circumferential Lower Leg: Change dressing three times week. Wound #64 Left,Dorsal Foot: Change dressing three times week. Wound #65 Right,Medial Lower Leg: Change dressing three times week. Skin Barriers/Peri-Wound Care: Moisturizing lotion - to dry skin Wound Cleansing: Clean wound with Wound Cleanser - wash legs with soap and water with dressing changes May shower with protection. Primary Wound Dressing: Wound #61 Left,Circumferential Lower Leg: Calcium Alginate with Silver Wound #64 Left,Dorsal Foot: Calcium Alginate with Silver Wound #65 Right,Medial Lower Leg: Calcium Alginate with Silver Wound #66 Left,Anterior Lower Leg: Calcium Alginate with Silver Secondary Dressing: Wound #61 Left,Circumferential Lower Leg: Dry Gauze ABD pad - as needed Wound #66 Left,Anterior Lower Leg: Dry Gauze ABD pad - as needed Wound #64 Left,Dorsal Foot: Dry Gauze ABD pad - as needed Wound #65 Right,Medial Lower  Leg: Dry Gauze Edema Control: 3 Layer  Compression System - Bilateral Avoid standing for long periods of time - walking is encouraged Elevate legs to the level of the heart or above for 30 minutes daily and/or when sitting, a frequency of: - do not sleep in chair with feet dangling, MUST elevate legs in recliner while sitting Exercise regularly Segmental Compressive Device. - lymphedema pumps 60 minutes 1- 2 times per day Off-Loading: Turn and reposition every 2 hours Additional Orders / Instructions: Follow Nutritious Diet - T include vitamin A, vitamin C, and Zinc along with increased protein intake. o Home Health: [facility] for [facility]. - [facility]. I am going to suggest currently that we go ahead and continue with the wound care measures as before using silver alginate to any open areas and again I do recommend continuation of the compression wraps at this point I think that is of utmost importance however she needs to keep these on right now she has been take them off before home health gets there to see her each time. 2. I am also can recommend at this time she needs to be using her lymphedema pumps she needs to clean off her lift chair so she can both elevate as well as use her pumps on a regular basis. 3. I am also can recommend the patient needs to take her Lasix as directed and also should be trying not to sleep in her chair but rather get in her bed. All of these things would make her life much easier as far as her legs are concerned. It would also make her pumps not hurt when she uses them if she could do it on a regular basis and keep the edema down. We will see patient back for reevaluation in 2 weeks here in the clinic. If anything worsens or changes patient will contact our office for additional recommendations. Electronic Signature(s) Signed: 03/14/2020 2:01:11 PM By: [name] PA-C Entered By: [name] on 03/14/2020  14:01:11 -------------------------------------------------------------------------------- SuperBill Details Patient Name: Date of Service: [name] 03/14/2020 Medical Record Number: [id number] Patient Account Number: [id number] Date of Birth/Sex: Treating RN: May 31, 1951 (69 y.o. [name] Primary Care Provider: [name] Other Clinician: Referring Provider: Treating Provider/Extender: Doyle Askew, FRED Weeks in Treatment: 136 Diagnosis Coding ICD-10 Codes Code Description E11.622 Type 2 diabetes mellitus with other skin ulcer I89.0 Lymphedema, not elsewhere classified I87.331 Chronic venous hypertension (idiopathic) with ulcer and inflammation of right lower extremity I87.332 Chronic venous hypertension (idiopathic) with ulcer and inflammation of left lower extremity L97.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 leg with fat layer exposed L97.822 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 I10 Essential (primary) hypertension E66.01 Morbid (severe) obesity due to excess calories F41.8 Other specified anxiety disorders R53.1 Weakness Facility Procedures The patient participates with Medicare or their insurance follows the Medicare Facility Guidelines: CPT4 Description Modifier Quantity Code [id number] BILATERAL: Application of multi-layer venous compression system; leg (below knee), including ankle and 1 foot. Physician Procedures : CPT4 Code Description Modifier [id number] - WC PHYS LEVEL 3 - EST PT ICD-10 Diagnosis Description E11.622 Type 2 diabetes mellitus with other skin ulcer I89.0 Lymphedema, not elsewhere classified I87.331 Chronic venous hypertension (idiopathic) with  ulcer and inflammation of right lower extremity I87.332 Chronic venous hypertension (idiopathic) with ulcer and inflammation of left  lower extremity  Quantity: 1 Electronic Signature(s) Signed: 03/14/2020 2:01:31 PM By: [name] PA-C Entered By: [name] on 03/14/2020 14:01:26

## 2020-03-15 ENCOUNTER — Other Ambulatory Visit: Payer: Self-pay

## 2020-03-15 ENCOUNTER — Ambulatory Visit
Admission: RE | Admit: 2020-03-15 | Discharge: 2020-03-15 | Disposition: A | Discharge status: Home / Self Care | Payer: Medicare PPO | Source: Ambulatory Visit | Attending: Neurology | Admitting: Neurology

## 2020-03-15 DIAGNOSIS — R413 Other amnesia: Secondary | ICD-10-CM | POA: Diagnosis not present

## 2020-03-16 NOTE — Progress Notes (Addendum)
REEVE, MALLO ([id number]) Visit Report for 03/14/2020 Arrival Information Details Patient Name: Date of Service: [name], [name] 03/14/2020 10:15 A M Medical Record Number: [id number] Patient Account Number: [id number] Date of Birth/Sex: Treating RN: 26-May-1951 (69 y.o. Martyn Malay, Linda Primary Care Latese Dufault: [name] Other Clinician: Referring Maki Sweetser: Treating Evynn Boutelle/Extender: Doyle Askew, FRED Weeks in Treatment: 136 Visit Information History Since Last Visit Added or deleted any medications: No Patient Arrived: Wheel Chair Any new allergies or adverse reactions: No Arrival Time: 10:32 Had a fall or experienced change in No Accompanied By: self activities of daily living that may affect Transfer Assistance: None risk of falls: Patient Identification Verified: Yes Signs or symptoms of abuse/neglect since last visito No Secondary Verification Process Completed: Yes Hospitalized since last visit: No Patient Requires Transmission-Based Precautions: No Implantable device outside of the clinic excluding No Patient Has Alerts: Yes cellular tissue based products placed in the center Patient Alerts: R ABI= 1.01 since last visit: L ABI = .99 Has Dressing in Place as Prescribed: Yes Pain Present Now: Yes Electronic Signature(s) Signed: 03/14/2020 1:07:48 PM By: Sandre Kitty Entered By: Sandre Kitty on 03/14/2020 10:32:48 -------------------------------------------------------------------------------- Compression Therapy Details Patient Name: Date of Service: [name] 03/14/2020 10:15 A M Medical Record Number: [id number] Patient Account Number: [id number] Date of Birth/Sex: Treating RN: [birth year] (69 y.o. [name] Primary Care Mio Schellinger: [name] Other Clinician: Referring Emmah Bratcher: Treating Ameliah Baskins/Extender: Doyle Askew, FRED Weeks in Treatment: 136 Compression Therapy Performed for Wound Assessment: Wound #61  Left,Circumferential Lower Leg Performed By: Clinician Carlene Coria, RN Compression Type: Three Layer Post Procedure Diagnosis Same as Pre-procedure Electronic Signature(s) Signed: 03/14/2020 5:36:27 PM By: [name] RN, BSN Entered By: [name] on 03/14/2020 11:18:42 -------------------------------------------------------------------------------- Compression Therapy Details Patient Name: Date of Service: [name]. 03/14/2020 10:15 A M Medical Record Number: [id number] Patient Account Number: [id number] Date of Birth/Sex: Treating RN: [birth year] (69 y.o. [name] Primary Care Lamondre Wesche: [name] Other Clinician: Referring Nabilah Davoli: Treating Khadijatou Borak/Extender: Doyle Askew, FRED Weeks in Treatment: 136 Compression Therapy Performed for Wound Assessment: Wound #65 Right,Medial Lower Leg Performed By: Clinician Carlene Coria, RN Compression Type: Three Layer Post Procedure Diagnosis Same as Pre-procedure Electronic Signature(s) Signed: 03/14/2020 5:36:27 PM By: [name] RN, BSN Entered By: [name] on 03/14/2020 11:18:43 -------------------------------------------------------------------------------- Encounter Discharge Information Details Patient Name: Date of Service: [name]. 03/14/2020 10:15 A M Medical Record Number: [id number] Patient Account Number: [id number] Date of Birth/Sex: Treating RN: 1950-10-16 (69 y.o. [name] Primary Care Kemoni Ortega: [name] Other Clinician: Referring Quasean Frye: Treating Odie Edmonds/Extender: Doyle Askew, FRED Weeks in Treatment: [phone] Encounter Discharge Information Items Discharge Condition: Stable Ambulatory Status: Wheelchair Discharge Destination: Home Transportation: Private Auto Accompanied By: self Schedule Follow-up Appointment: Yes Clinical Summary of Care: Patient Declined Electronic Signature(s) Signed: 03/16/2020 4:20:16 PM By: Carlene Coria  RN Entered By: Carlene Coria on 03/14/2020 11:52:15 -------------------------------------------------------------------------------- Lower Extremity Assessment Details Patient Name: Date of Service: [name], [name] 03/14/2020 10:15 A M Medical Record Number: [id number] Patient Account Number: [id number] Date of Birth/Sex: Treating RN: Nov 11, 1950 (69 y.o. [name] Primary Care Iisha Soyars: [name] Other Clinician: Referring Taji Sather: Treating Niana Martorana/Extender: Doyle Askew, FRED Weeks in Treatment: 136 Edema Assessment Assessed: [Left: No] [Right: No] Edema: [Left: Yes] [Right: Yes] Calf Left: Right: Point of Measurement: 37 cm From Medial Instep 42 cm 39 cm Ankle Left: Right: Point of Measurement: 10 cm From Medial Instep 29 cm 23.5  cm Electronic Signature(s) Signed: 03/16/2020 4:20:16 PM By: Carlene Coria RN Entered By: Carlene Coria on 03/14/2020 11:03:02 -------------------------------------------------------------------------------- Multi-Disciplinary Care Plan Details Patient Name: Date of Service: [name]. 03/14/2020 10:15 A M Medical Record Number: [id number] Patient Account Number: [id number] Date of Birth/Sex: Treating RN: [birth year] (69 y.o. [name] Primary Care Manley Fason: [name] Other Clinician: Referring Baltazar Pekala: Treating Ermel Verne/Extender: Doyle Askew, FRED Weeks in Treatment: [phone] Active Inactive Venous Leg Ulcer Nursing Diagnoses: Actual venous Insuffiency (use after diagnosis is confirmed) Knowledge deficit related to disease process and management Goals: Patient will maintain optimal edema control Date Initiated: 08/12/2017 Target Resolution Date: 04/11/2020 Goal Status: Active Patient/caregiver will verbalize understanding of disease process and disease management Date Initiated: 08/12/2017 Date Inactivated: 04/21/2018 Target Resolution Date: 04/24/2018 Goal Status: Met Interventions: Assess  peripheral edema status every visit. Compression as ordered Treatment Activities: Therapeutic compression applied : 08/12/2017 Notes: Wound/Skin Impairment Nursing Diagnoses: Impaired tissue integrity Knowledge deficit related to ulceration/compromised skin integrity Goals: Patient/caregiver will verbalize understanding of skin care regimen Date Initiated: 08/12/2017 Target Resolution Date: 04/11/2020 Goal Status: Active Ulcer/skin breakdown will have a volume reduction of 30% by week 4 Date Initiated: 08/05/2017 Date Inactivated: 09/30/2017 Target Resolution Date: 10/03/2017 Goal Status: Met Ulcer/skin breakdown will have a volume reduction of 50% by week 8 Date Initiated: 09/30/2017 Date Inactivated: 10/28/2017 Target Resolution Date: 10/28/2017 Goal Status: Met Interventions: Assess patient/caregiver ability to perform ulcer/skin care regimen upon admission and as needed Assess ulceration(s) every visit Provide education on ulcer and skin care Screen for HBO Treatment Activities: Patient referred to home care : 08/05/2017 Skin care regimen initiated : 08/05/2017 Topical wound management initiated : 08/05/2017 Notes: Electronic Signature(s) Signed: 03/14/2020 5:36:27 PM By: [name] RN, BSN Entered By: [name] on 03/14/2020 11:17:35 -------------------------------------------------------------------------------- Pain Assessment Details Patient Name: Date of Service: [name]. 03/14/2020 10:15 A M Medical Record Number: [id number] Patient Account Number: [id number] Date of Birth/Sex: Treating RN: 01/20/1951 (69 y.o. [name] Primary Care Troy Hartzog: [name] Other Clinician: Referring Elianny Buxbaum: Treating Kanesha Cadle/Extender: Doyle Askew, FRED Weeks in Treatment: [phone] Active Problems Location of Pain Severity and Description of Pain Patient Has Paino Yes Site Locations Rate the pain. Current Pain Level: 6 Pain Management and  Medication Current Pain Management: Electronic Signature(s) Signed: 03/14/2020 1:07:48 PM By: Sandre Kitty Signed: 03/14/2020 5:36:27 PM By: [name] RN, BSN Entered By: Sandre Kitty on 03/14/2020 10:33:10 -------------------------------------------------------------------------------- Patient/Caregiver Education Details Patient Name: Date of Service: [name] 9/29/2021andnbsp10:15 A M Medical Record Number: [id number] Patient Account Number: [id number] Date of Birth/Gender: Treating RN: 07-Jun-1951 (69 y.o. [name] Primary Care Physician: [name] Other Clinician: Referring Physician: Treating Physician/Extender: Doyle Askew, FRED Weeks in Treatment: [phone] Education Assessment Education Provided To: Patient Education Topics Provided Venous: Methods: Explain/Verbal Responses: Reinforcements needed, State content correctly Wound/Skin Impairment: Methods: Explain/Verbal Responses: Reinforcements needed, State content correctly Electronic Signature(s) Signed: 03/14/2020 5:36:27 PM By: [name] RN, BSN Entered By: [name] on 03/14/2020 11:18:01 -------------------------------------------------------------------------------- Wound Assessment Details Patient Name: Date of Service: [name]. 03/14/2020 10:15 A M Medical Record Number: [id number] Patient Account Number: [id number] Date of Birth/Sex: Treating RN: 31-Mar-1951 (69 y.o. [name] Primary Care Shery Wauneka: [name] Other Clinician: Referring Kailiana Granquist: Treating Caliyah Sieh/Extender: Doyle Askew, FRED Weeks in Treatment: 136 Wound Status Wound Number: 61 Primary Venous Leg Ulcer Etiology: Wound Location: Left, Circumferential Lower Leg Wound Open Wounding Event: Gradually Appeared Status: Date Acquired: 04/20/2019 Comorbid  Asthma, Hypertension, Peripheral Arterial Disease, Peripheral Weeks Of Treatment: 47 History: Venous Disease,  Type II Diabetes, Gout, Osteoarthritis Clustered Wound: Yes Photos Photo Uploaded By: Mikeal Hawthorne on 03/20/2020 09:56:28 Wound Measurements Length: (cm) 16 Width: (cm) 30.5 Depth: (cm) 0.1 Clustered Quantity: 5 Area: (cm) 383.274 Volume: (cm) 38.327 Wound Description Classification: Full Thickness Without Exposed Support Struct Wound Margin: Flat and Intact Exudate Amount: Large Exudate Type: Serous Exudate Color: amber Foul Odor After Cleansing: Slough/Fibrino % Reduction in Area: -4983.2% % Reduction in Volume: -4983.2% Epithelialization: Small (1-33%) Tunneling: No Undermining: No ures No Yes Wound Bed Granulation Amount: Medium (34-66%) Exposed Structure Granulation Quality: Pink, Pale Fascia Exposed: No Necrotic Amount: Medium (34-66%) Fat Layer (Subcutaneous Tissue) Exposed: Yes Necrotic Quality: Adherent Slough Tendon Exposed: No Muscle Exposed: No Joint Exposed: No Bone Exposed: No Treatment Notes Wound #61 (Left, Circumferential Lower Leg) 1. Cleanse With Wound Cleanser Soap and water 3. Primary Dressing Applied Calcium Alginate Ag 4. Secondary Dressing Dry Gauze 6. Support Layer Applied 4 layer compression wrap Notes netting Electronic Signature(s) Signed: 03/14/2020 5:36:27 PM By: [name] RN, BSN Signed: 03/16/2020 4:20:16 PM By: Carlene Coria RN Entered By: Carlene Coria on 03/14/2020 11:03:22 -------------------------------------------------------------------------------- Wound Assessment Details Patient Name: Date of Service: [name]. 03/14/2020 10:15 A M Medical Record Number: [id number] Patient Account Number: [id number] Date of Birth/Sex: Treating RN: 01-14-1951 (69 y.o. [name] Primary Care Veyda Kaufman: [name] Other Clinician: Referring Hollin Crewe: Treating Arihaan Bellucci/Extender: Doyle Askew, FRED Weeks in Treatment: 136 Wound Status Wound Number: 64 Primary Diabetic Wound/Ulcer of the Lower  Extremity Etiology: Wound Location: Left, Dorsal Foot Wound Open Wounding Event: Gradually Appeared Status: Date Acquired: 06/01/2019 Comorbid Asthma, Hypertension, Peripheral Arterial Disease, Peripheral Weeks Of Treatment: 41 History: Venous Disease, Type II Diabetes, Gout, Osteoarthritis Clustered Wound: No Photos Photo Uploaded By: Mikeal Hawthorne on 03/20/2020 09:55:23 Wound Measurements Length: (cm) 7 Width: (cm) 7.5 Depth: (cm) 0.1 Area: (cm) 41.233 Volume: (cm) 4.123 % Reduction in Area: -24889.7% % Reduction in Volume: -8314.3% Epithelialization: Small (1-33%) Tunneling: No Undermining: No Wound Description Classification: Grade 1 Wound Margin: Flat and Intact Exudate Amount: Medium Exudate Type: Serous Exudate Color: amber Foul Odor After Cleansing: No Slough/Fibrino Yes Wound Bed Granulation Amount: Large (67-100%) Exposed Structure Granulation Quality: Pink, Pale Fascia Exposed: No Necrotic Amount: Small (1-33%) Fat Layer (Subcutaneous Tissue) Exposed: Yes Necrotic Quality: Adherent Slough Tendon Exposed: No Muscle Exposed: No Joint Exposed: No Bone Exposed: No Treatment Notes Wound #64 (Left, Dorsal Foot) 1. Cleanse With Wound Cleanser Soap and water 3. Primary Dressing Applied Calcium Alginate Ag 4. Secondary Dressing Dry Gauze 6. Support Layer Applied 4 layer compression wrap Notes netting Electronic Signature(s) Signed: 03/14/2020 5:36:27 PM By: [name] RN, BSN Signed: 03/16/2020 4:20:16 PM By: Carlene Coria RN Entered By: Carlene Coria on 03/14/2020 11:04:25 -------------------------------------------------------------------------------- Wound Assessment Details Patient Name: Date of Service: [name]. 03/14/2020 10:15 A M Medical Record Number: [id number] Patient Account Number: [id number] Date of Birth/Sex: Treating RN: July 18, 1950 (69 y.o. [name] Primary Care Adelynne Joerger: [name] Other Clinician: Referring  Harlee Eckroth: Treating Muneer Leider/Extender: Doyle Askew, FRED Weeks in Treatment: 136 Wound Status Wound Number: 65 Primary Venous Leg Ulcer Etiology: Wound Location: Right, Medial Lower Leg Wound Open Wounding Event: Gradually Appeared Status: Date Acquired: 09/28/2019 Comorbid Asthma, Hypertension, Peripheral Arterial Disease, Peripheral Weeks Of Treatment: 24 History: Venous Disease, Type II Diabetes, Gout, Osteoarthritis Clustered Wound: No Photos Photo Uploaded By: Mikeal Hawthorne on 03/20/2020 09:55:24 Wound Measurements Length: (cm)  2 Width: (cm) 1.1 Depth: (cm) 0.1 Area: (cm) 1.728 Volume: (cm) 0.173 % Reduction in Area: -57.1% % Reduction in Volume: -57.3% Epithelialization: Large (67-100%) Tunneling: No Undermining: No Wound Description Classification: Full Thickness Without Exposed Support Structures Wound Margin: Distinct, outline attached Exudate Amount: Medium Exudate Type: Serosanguineous Exudate Color: red, brown Foul Odor After Cleansing: No Slough/Fibrino Yes Wound Bed Granulation Amount: Large (67-100%) Exposed Structure Granulation Quality: Red Fascia Exposed: No Necrotic Amount: None Present (0%) Fat Layer (Subcutaneous Tissue) Exposed: Yes Tendon Exposed: No Muscle Exposed: No Joint Exposed: No Bone Exposed: No Treatment Notes Wound #65 (Right, Medial Lower Leg) 1. Cleanse With Wound Cleanser Soap and water 3. Primary Dressing Applied Calcium Alginate Ag 4. Secondary Dressing Dry Gauze 6. Support Layer Applied 4 layer compression wrap Notes netting Electronic Signature(s) Signed: 03/14/2020 5:36:27 PM By: [name] RN, BSN Signed: 03/16/2020 4:20:16 PM By: Carlene Coria RN Entered By: Carlene Coria on 03/14/2020 11:04:50 -------------------------------------------------------------------------------- Wound Assessment Details Patient Name: Date of Service: [name]. 03/14/2020 10:15 A M Medical Record Number:  [id number] Patient Account Number: [id number] Date of Birth/Sex: Treating RN: [birth year] (69 y.o. [name] Primary Care Nesanel Aguila: [name] Other Clinician: Referring Tywon Niday: Treating Leni Pankonin/Extender: Doyle Askew, FRED Weeks in Treatment: 136 Wound Status Wound Number: 66 Primary Diabetic Wound/Ulcer of the Lower Extremity Etiology: Wound Location: Left, Anterior Lower Leg Wound Open Wounding Event: Gradually Appeared Status: Date Acquired: 03/14/2020 Comorbid Asthma, Hypertension, Peripheral Arterial Disease, Peripheral Weeks Of Treatment: 0 History: Venous Disease, Type II Diabetes, Gout, Osteoarthritis Clustered Wound: No Photos Wound Measurements Length: (cm) 0.5 % Re Width: (cm) 0.5 % Re Depth: (cm) 0.1 Epit Area: (cm) 0.196 Tun Volume: (cm) 0.02 Und duction in Area: 0% duction in Volume: 0% helialization: None neling: No ermining: No Wound Description Classification: Grade 2 Foul Exudate Amount: Medium Slou Exudate Type: Serosanguineous Exudate Color: red, brown Odor After Cleansing: No gh/Fibrino Yes Wound Bed Granulation Amount: Medium (34-66%) Exposed Structure Granulation Quality: Pink, Pale Fascia Exposed: No Necrotic Amount: Medium (34-66%) Fat Layer (Subcutaneous Tissue) Exposed: Yes Necrotic Quality: Adherent Slough Tendon Exposed: No Muscle Exposed: No Joint Exposed: No Bone Exposed: No Treatment Notes Wound #66 (Left, Anterior Lower Leg) 1. Cleanse With Wound Cleanser Soap and water 3. Primary Dressing Applied Calcium Alginate Ag 4. Secondary Dressing Dry Gauze 6. Support Layer Applied 4 layer compression wrap Notes netting Electronic Signature(s) Signed: 03/20/2020 3:41:17 PM By: Mikeal Hawthorne EMT/HBOT/SD Signed: 03/20/2020 5:49:38 PM By: [name] RN, BSN Previous Signature: 03/14/2020 5:36:27 PM Version By: [name] RN, BSN Previous Signature: 03/16/2020 4:20:16 PM Version By: Carlene Coria  RN Entered By: Mikeal Hawthorne on 03/20/2020 09:54:31 -------------------------------------------------------------------------------- [place]eyo Details Patient Name: Date of Service: [name]. 03/14/2020 10:15 A M Medical Record Number: [id number] Patient Account Number: [id number] Date of Birth/Sex: Treating RN: 24-Sep-1950 (69 y.o. [name] Primary Care Branston Halsted: [name] Other Clinician: Referring Winefred Hillesheim: Treating Sanjuana Mruk/Extender: Doyle Askew, FRED Weeks in Treatment: 136 Vital Signs Time Taken: 10:32 Temperature (F): 97.8 Height (in): 62 Pulse (bpm): 79 Weight (lbs): 335 Respiratory Rate (breaths/min): 18 Body Mass Index (BMI): 61.3 Blood Pressure (mmHg): 123/77 Reference Range: 80 - 120 mg / dl Electronic Signature(s) Signed: 03/14/2020 1:07:48 PM By: Sandre Kitty Entered By: Sandre Kitty on 03/14/2020 10:33:01

## 2020-03-26 ENCOUNTER — Telehealth: Payer: Self-pay | Admitting: Neurology

## 2020-03-26 NOTE — Telephone Encounter (Signed)
Called, LVM for pt about results. Advised she did not need to return call unless she had any other questions/concerns.

## 2020-03-26 NOTE — Telephone Encounter (Signed)
Reviewed pt chart. Dr. Epimenio Foot sent pt mychart message on 03/17/20 about results: "The CT scan of the brain was normal for age."

## 2020-03-26 NOTE — Telephone Encounter (Signed)
Pt called wanting to know if her CT Scan results have come in. Please advise.

## 2020-04-04 ENCOUNTER — Encounter (HOSPITAL_BASED_OUTPATIENT_CLINIC_OR_DEPARTMENT_OTHER): Payer: Medicare PPO | Attending: Physician Assistant | Admitting: Physician Assistant

## 2020-04-04 ENCOUNTER — Other Ambulatory Visit: Payer: Self-pay

## 2020-04-04 DIAGNOSIS — L97822 Non-pressure chronic ulcer of other part of left lower leg with fat layer exposed: Secondary | ICD-10-CM | POA: Insufficient documentation

## 2020-04-04 DIAGNOSIS — Z09 Encounter for follow-up examination after completed treatment for conditions other than malignant neoplasm: Secondary | ICD-10-CM | POA: Insufficient documentation

## 2020-04-04 DIAGNOSIS — E11621 Type 2 diabetes mellitus with foot ulcer: Secondary | ICD-10-CM | POA: Insufficient documentation

## 2020-04-04 DIAGNOSIS — I890 Lymphedema, not elsewhere classified: Secondary | ICD-10-CM | POA: Insufficient documentation

## 2020-04-04 DIAGNOSIS — L97522 Non-pressure chronic ulcer of other part of left foot with fat layer exposed: Secondary | ICD-10-CM | POA: Insufficient documentation

## 2020-04-04 DIAGNOSIS — Z6841 Body Mass Index (BMI) 40.0 and over, adult: Secondary | ICD-10-CM | POA: Diagnosis not present

## 2020-04-04 DIAGNOSIS — I87333 Chronic venous hypertension (idiopathic) with ulcer and inflammation of bilateral lower extremity: Secondary | ICD-10-CM | POA: Diagnosis not present

## 2020-04-04 DIAGNOSIS — E11622 Type 2 diabetes mellitus with other skin ulcer: Secondary | ICD-10-CM | POA: Diagnosis present

## 2020-04-04 NOTE — Progress Notes (Signed)
[name], [name] ([id number]) Visit Report for 04/04/2020 Arrival Information Details Patient Name: Date of Service: [name], [name] 04/04/2020 10:15 A M Medical Record Number: [id number] Patient Account Number: [id number] Date of Birth/Sex: Treating RN: 03/18/1951 (68 y.o. [name], Meta.Reding Primary Care Terree Gaultney: [name] Other Clinician: Referring Willamae Demby: Treating Aritha Huckeba/Extender: [name], [name] Weeks in Treatment: 139 Visit Information History Since Last Visit Added or deleted any medications: No Patient Arrived: Wheel Chair Any new allergies or adverse reactions: No Arrival Time: 10:30 Had a fall or experienced change in No Accompanied By: self activities of daily living that may affect Transfer Assistance: None risk of falls: Patient Identification Verified: Yes Signs or symptoms of abuse/neglect since last visito No Secondary Verification Process Completed: Yes Hospitalized since last visit: No Patient Requires Transmission-Based Precautions: No Implantable device outside of the clinic excluding No Patient Has Alerts: Yes cellular tissue based products placed in the center Patient Alerts: R ABI= 1.01 since last visit: L ABI = .99 Has Dressing in Place as Prescribed: Yes Has Compression in Place as Prescribed: Yes Pain Present Now: No Electronic Signature(s) Signed: 04/04/2020 4:36:52 PM By: Deon Pilling Entered By: Deon Pilling on 04/04/2020 10:37:57 -------------------------------------------------------------------------------- Compression Therapy Details Patient Name: Date of Service: [name] 04/04/2020 10:15 A M Medical Record Number: [id number] Patient Account Number: [id number] Date of Birth/Sex: Treating RN: 1951/03/05 (69 y.o. [name] Primary Care Masae Lukacs: [name] Other Clinician: Referring Eiza Canniff: Treating Marcey Persad/Extender: [name], [name] Weeks in Treatment: 139 Compression Therapy  Performed for Wound Assessment: Wound #61 Left,Circumferential Lower Leg Performed By: Clinician Kela Millin, RN Compression Type: Three Layer Post Procedure Diagnosis Same as Pre-procedure Electronic Signature(s) Signed: 04/04/2020 4:55:29 PM By: [name] RN, BSN Entered By: [name] on 04/04/2020 11:13:13 -------------------------------------------------------------------------------- Compression Therapy Details Patient Name: Date of Service: [name] 04/04/2020 10:15 A M Medical Record Number: [id number] Patient Account Number: [id number] Date of Birth/Sex: Treating RN: [birth year] (69 y.o. [name] Primary Care Treyvin Glidden: [name] Other Clinician: Referring Shreyansh Tiffany: Treating Keelyn Fjelstad/Extender: [name], [name] Weeks in Treatment: 139 Compression Therapy Performed for Wound Assessment: Wound #66 Right,Anterior Lower Leg Performed By: Clinician Kela Millin, RN Compression Type: Three Layer Post Procedure Diagnosis Same as Pre-procedure Electronic Signature(s) Signed: 04/04/2020 4:55:29 PM By: [name] RN, BSN Entered By: [name] on 04/04/2020 11:13:13 -------------------------------------------------------------------------------- Encounter Discharge Information Details Patient Name: Date of Service: [name]. 04/04/2020 10:15 A M Medical Record Number: [id number] Patient Account Number: [id number] Date of Birth/Sex: Treating RN: 1951/06/15 (69 y.o. [name] Primary Care Derelle Cockrell: [name] Other Clinician: Referring Hogan Hoobler: Treating Brinly Maietta/Extender: [name], [name] Weeks in Treatment: [id number] Encounter Discharge Information Items Discharge Condition: Stable Ambulatory Status: Wheelchair Discharge Destination: Home Transportation: Other Accompanied By: self Schedule Follow-up Appointment: Yes Clinical Summary of Care: Patient Declined Electronic  Signature(s) Signed: 04/04/2020 4:37:14 PM By: Kela Millin Entered By: Kela Millin on 04/04/2020 11:24:02 -------------------------------------------------------------------------------- Lower Extremity Assessment Details Patient Name: Date of Service: [name]. 04/04/2020 10:15 A M Medical Record Number: [id number] Patient Account Number: [id number] Date of Birth/Sex: Treating RN: 02-26-1951 (69 y.o. [name] Primary Care Evyn Kooyman: [name] Other Clinician: Referring Jacqualynn Parco: Treating Boykin Baetz/Extender: [name], [name] Weeks in Treatment: 139 Edema Assessment Assessed: [Left: Yes] [Right: Yes] Edema: [Left: Yes] [Right: Yes] Calf Left: Right: Point of Measurement: 37 cm From Medial Instep 44 cm 36 cm Ankle Left: Right: Point of Measurement: 10 cm From  Medial Instep 31 cm 23 cm Electronic Signature(s) Signed: 04/04/2020 4:36:52 PM By: Deon Pilling Entered By: Deon Pilling on 04/04/2020 10:38:52 -------------------------------------------------------------------------------- [place] Details Patient Name: Date of Service: [name]. 04/04/2020 10:15 A M Medical Record Number: [id number] Patient Account Number: [id number] Date of Birth/Sex: Treating RN: 08-14-1950 (69 y.o. [name] Primary Care Jinelle Butchko: [name] Other Clinician: Referring Makoa Satz: Treating Taleya Whitcher/Extender: [name], [name] Weeks in Treatment: [phone] Active Inactive Venous Leg Ulcer Nursing Diagnoses: Actual venous Insuffiency (use after diagnosis is confirmed) Knowledge deficit related to disease process and management Goals: Patient will maintain optimal edema control Date Initiated: 08/12/2017 Target Resolution Date: 04/11/2020 Goal Status: Active Patient/caregiver will verbalize understanding of disease process and disease management Date Initiated: 08/12/2017 Date Inactivated: 04/21/2018 Target  Resolution Date: 04/24/2018 Goal Status: Met Interventions: Assess peripheral edema status every visit. Compression as ordered Treatment Activities: Therapeutic compression applied : 08/12/2017 Notes: Wound/Skin Impairment Nursing Diagnoses: Impaired tissue integrity Knowledge deficit related to ulceration/compromised skin integrity Goals: Patient/caregiver will verbalize understanding of skin care regimen Date Initiated: 08/12/2017 Target Resolution Date: 04/11/2020 Goal Status: Active Ulcer/skin breakdown will have a volume reduction of 30% by week 4 Date Initiated: 08/05/2017 Date Inactivated: 09/30/2017 Target Resolution Date: 10/03/2017 Goal Status: Met Ulcer/skin breakdown will have a volume reduction of 50% by week 8 Date Initiated: 09/30/2017 Date Inactivated: 10/28/2017 Target Resolution Date: 10/28/2017 Goal Status: Met Interventions: Assess patient/caregiver ability to perform ulcer/skin care regimen upon admission and as needed Assess ulceration(s) every visit Provide education on ulcer and skin care Screen for HBO Treatment Activities: Patient referred to home care : 08/05/2017 Skin care regimen initiated : 08/05/2017 Topical wound management initiated : 08/05/2017 Notes: Electronic Signature(s) Signed: 04/04/2020 4:55:29 PM By: [name] RN, BSN Entered By: [name] on 04/04/2020 11:09:15 -------------------------------------------------------------------------------- Pain Assessment Details Patient Name: Date of Service: [name]. 04/04/2020 10:15 A M Medical Record Number: [id number] Patient Account Number: [id number] Date of Birth/Sex: Treating RN: Dec 27, 1950 (69 y.o. [name] Primary Care Philopater Mucha: [name] Other Clinician: Referring Clearance Chenault: Treating Nixon Sparr/Extender: [name], [name] Weeks in Treatment: [phone] Active Problems Location of Pain Severity and Description of Pain Patient Has Paino No Site  Locations Rate the pain. Current Pain Level: 0 Pain Management and Medication Current Pain Management: Medication: No Cold Application: No Rest: No Massage: No Activity: No T.E.N.S.: No Heat Application: No Leg drop or elevation: No Is the Current Pain Management Adequate: Adequate How does your wound impact your activities of daily livingo Sleep: No Bathing: No Appetite: No Relationship With Others: No Bladder Continence: No Emotions: No Bowel Continence: No Work: No Toileting: No Drive: No Dressing: No Hobbies: No Electronic Signature(s) Signed: 04/04/2020 4:36:52 PM By: Deon Pilling Entered By: Deon Pilling on 04/04/2020 10:38:34 -------------------------------------------------------------------------------- Patient/Caregiver Education Details Patient Name: Date of Service: [name] 10/20/2021andnbsp10:15 A M Medical Record Number: [id number] Patient Account Number: [id number] Date of Birth/Gender: Treating RN: [birth year] (69 y.o. [name] Primary Care Physician: [name] Other Clinician: Referring Physician: Treating Physician/Extender: [name], [name] Weeks in Treatment: [phone] Education Assessment Education Provided To: Patient Education Topics Provided Venous: Methods: Explain/Verbal Responses: Reinforcements needed, State content correctly Wound/Skin Impairment: Methods: Explain/Verbal Responses: Reinforcements needed, State content correctly Electronic Signature(s) Signed: 04/04/2020 4:55:29 PM By: [name] RN, BSN Entered By: [name] on 04/04/2020 11:09:40 -------------------------------------------------------------------------------- Wound Assessment Details Patient Name: Date of Service: [name]. 04/04/2020 10:15 A M Medical Record Number: [id number] Patient  Account Number: [id number] Date of Birth/Sex: Treating RN: Jun 05, 1951 (69 y.o. [name] Primary Care Harman Ferrin: [name] Other Clinician: Referring Cristal Howatt: Treating Elodie Panameno/Extender: [name], [name] Weeks in Treatment: 139 Wound Status Wound Number: 61 Primary Venous Leg Ulcer Etiology: Wound Location: Left, Circumferential Lower Leg Wound Open Wounding Event: Gradually Appeared Status: Date Acquired: 04/20/2019 Comorbid Asthma, Hypertension, Peripheral Arterial Disease, Peripheral Weeks Of Treatment: 50 History: Venous Disease, Type II Diabetes, Gout, Osteoarthritis Clustered Wound: Yes Wound Measurements Length: (cm) 14 Width: (cm) 16.5 Depth: (cm) 0.1 Clustered Quantity: 3 Area: (cm) 181.427 Volume: (cm) 18.143 Wound Description Classification: Full Thickness Without Exposed Support Struct Wound Margin: Flat and Intact Exudate Amount: Medium Exudate Type: Serous Exudate Color: amber Foul Odor After Cleansing: Slough/Fibrino % Reduction in Area: -2306.2% % Reduction in Volume: -2306.2% Epithelialization: Large (67-100%) Tunneling: No Undermining: No ures No Yes Wound Bed Granulation Amount: Large (67-100%) Exposed Structure Granulation Quality: Pink, Pale Fascia Exposed: No Necrotic Amount: Small (1-33%) Fat Layer (Subcutaneous Tissue) Exposed: Yes Necrotic Quality: Adherent Slough Tendon Exposed: No Muscle Exposed: No Joint Exposed: No Bone Exposed: No Treatment Notes Wound #61 (Left, Circumferential Lower Leg) 1. Cleanse With Wound Cleanser Soap and water 2. Periwound Care Moisturizing lotion 3. Primary Dressing Applied Calcium Alginate Ag 4. Secondary Dressing ABD Pad 6. Support Layer Applied 3 layer compression wrap Notes stockinette Electronic Signature(s) Signed: 04/04/2020 4:36:52 PM By: Deon Pilling Entered By: Deon Pilling on 04/04/2020 10:36:20 -------------------------------------------------------------------------------- Wound Assessment Details Patient Name: Date of Service: CATHRYNE, MANCEBO 04/04/2020 10:15 A M Medical  Record Number: [id number] Patient Account Number: [id number] Date of Birth/Sex: Treating RN: 1950-07-15 (69 y.o. [name] Primary Care Eliane Hammersmith: [name] Other Clinician: Referring Taylinn Brabant: Treating Jeri Jeanbaptiste/Extender: [name], [name] Weeks in Treatment: 139 Wound Status Wound Number: 64 Primary Diabetic Wound/Ulcer of the Lower Extremity Etiology: Wound Location: Left, Dorsal Foot Wound Open Wounding Event: Gradually Appeared Status: Date Acquired: 06/01/2019 Comorbid Asthma, Hypertension, Peripheral Arterial Disease, Peripheral Weeks Of Treatment: 44 History: Venous Disease, Type II Diabetes, Gout, Osteoarthritis Clustered Wound: No Wound Measurements Length: (cm) 5.2 Width: (cm) 7 Depth: (cm) 0.1 Area: (cm) 28.588 Volume: (cm) 2.859 Wound Description Classification: Grade 1 Wound Margin: Flat and Intact Exudate Amount: Medium Exudate Type: Serous Exudate Color: amber Foul Odor After Cleansing: Slough/Fibrino % Reduction in Area: -17226.1% % Reduction in Volume: -5734.7% Epithelialization: Medium (34-66%) Tunneling: No Undermining: No No Yes Wound Bed Granulation Amount: Large (67-100%) Exposed Structure Granulation Quality: Pink, Pale Fascia Exposed: No Necrotic Amount: Small (1-33%) Fat Layer (Subcutaneous Tissue) Exposed: Yes Necrotic Quality: Adherent Slough Tendon Exposed: No Muscle Exposed: No Joint Exposed: No Bone Exposed: No Treatment Notes Wound #64 (Left, Dorsal Foot) 1. Cleanse With Wound Cleanser Soap and water 2. Periwound Care Moisturizing lotion 3. Primary Dressing Applied Calcium Alginate Ag 4. Secondary Dressing ABD Pad 6. Support Layer Applied 3 layer compression wrap Notes stockinette Electronic Signature(s) Signed: 04/04/2020 4:36:52 PM By: Deon Pilling Entered By: Deon Pilling on 04/04/2020 10:36:53 -------------------------------------------------------------------------------- Wound Assessment  Details Patient Name: Date of Service: MARLITA, KEIL 04/04/2020 10:15 A M Medical Record Number: [id number] Patient Account Number: [id number] Date of Birth/Sex: Treating RN: Aug 01, 1950 (69 y.o. [name] Primary Care Jericha Bryden: [name] Other Clinician: Referring Taiana Temkin: Treating Kalima Saylor/Extender: [name], [name] Weeks in Treatment: 139 Wound Status Wound Number: 65 Primary Etiology: Venous Leg Ulcer Wound Location: Right, Medial Lower Leg Wound Status: Healed - Epithelialized Wounding Event: Gradually Appeared Date Acquired: 09/28/2019  Weeks Of Treatment: 27 Clustered Wound: No Wound Measurements Length: (cm) Width: (cm) Depth: (cm) Area: (cm) Volume: (cm) 0 % Reduction in Area: 100% 0 % Reduction in Volume: 100% 0 0 0 Wound Description Classification: Full Thickness Without Exposed Support Structur es Electronic Signature(s) Signed: 04/04/2020 4:36:52 PM By: Deon Pilling Entered By: Deon Pilling on 04/04/2020 10:35:51 -------------------------------------------------------------------------------- Wound Assessment Details Patient Name: Date of Service: ARAMINTA, ZORN 04/04/2020 10:15 A M Medical Record Number: [id number] Patient Account Number: [id number] Date of Birth/Sex: Treating RN: [birth year] (69 y.o. [name] Primary Care Maalle Starrett: [name] Other Clinician: Referring Reality Dejonge: Treating Corben Auzenne/Extender: [name], [name] Weeks in Treatment: 139 Wound Status Wound Number: 66 Primary Diabetic Wound/Ulcer of the Lower Extremity Etiology: Wound Location: Right, Anterior Lower Leg Wound Open Wounding Event: Gradually Appeared Status: Date Acquired: 03/14/2020 Comorbid Asthma, Hypertension, Peripheral Arterial Disease, Peripheral Weeks Of Treatment: 3 History: Venous Disease, Type II Diabetes, Gout, Osteoarthritis Clustered Wound: No Wound Measurements Length: (cm) 0.2 Width: (cm) 0.2 Depth:  (cm) 0.1 Area: (cm) 0.031 Volume: (cm) 0.003 % Reduction in Area: 84.2% % Reduction in Volume: 85% Epithelialization: Large (67-100%) Tunneling: No Undermining: No Wound Description Classification: Grade 2 Exudate Amount: Small Exudate Type: Serosanguineous Exudate Color: red, brown Foul Odor After Cleansing: No Slough/Fibrino Yes Wound Bed Granulation Amount: Small (1-33%) Exposed Structure Granulation Quality: Pink, Pale Fascia Exposed: No Necrotic Amount: Large (67-100%) Fat Layer (Subcutaneous Tissue) Exposed: Yes Necrotic Quality: Adherent Slough Tendon Exposed: No Muscle Exposed: No Joint Exposed: No Bone Exposed: No Treatment Notes Wound #66 (Right, Anterior Lower Leg) 1. Cleanse With Wound Cleanser Soap and water 2. Periwound Care Moisturizing lotion 3. Primary Dressing Applied Calcium Alginate Ag 4. Secondary Dressing ABD Pad 6. Support Layer Applied 3 layer compression wrap Notes stockinette Electronic Signature(s) Signed: 04/04/2020 4:36:52 PM By: Deon Pilling Entered By: Deon Pilling on 04/04/2020 10:35:27 -------------------------------------------------------------------------------- Vitals Details Patient Name: Date of Service: [name]. 04/04/2020 10:15 A M Medical Record Number: [id number] Patient Account Number: [id number] Date of Birth/Sex: Treating RN: [birth year] (69 y.o. [name] Primary Care Iriel Nason: [name] Other Clinician: Referring Azia Toutant: Treating Quinlan Mcfall/Extender: [name], [name] Weeks in Treatment: 139 Vital Signs Time Taken: 10:31 Temperature (F): 97.8 Height (in): 62 Pulse (bpm): 97 Weight (lbs): 335 Respiratory Rate (breaths/min): 22 Body Mass Index (BMI): 61.3 Blood Pressure (mmHg): 136/85 Reference Range: 80 - 120 mg / dl Electronic Signature(s) Signed: 04/04/2020 4:36:52 PM By: Deon Pilling Entered By: Deon Pilling on 04/04/2020 10:38:12

## 2020-04-04 NOTE — Progress Notes (Addendum)
[name] [name] ([id number]) Visit Report for 04/04/2020 Chief Complaint Document Details Patient Name: Date of Service: [name] [name] 04/04/2020 10:15 A M Medical Record Number: [id number] Patient Account Number: [id number] Date of Birth/Sex: Treating RN: January 07, 1951 (69 y.o. [name] [name] Primary Care Provider: Dustin [name] Other Clinician: Referring Provider: Treating Provider/Extender: [name] [name] Weeks in Treatment: [phone] Information Obtained from: Patient Chief Complaint Bilateral reoccurring LE ulcers Electronic Signature(s) Signed: 04/04/2020 10:29:06 AM By: [name] Keeler PA-C Entered By: [name] [name] on 04/04/2020 10:29:06 -------------------------------------------------------------------------------- HPI Details Patient Name: Date of Service: [name] [name]. 04/04/2020 10:15 A M Medical Record Number: [id number] Patient Account Number: [id number] Date of Birth/Sex: Treating RN: [birth year] (69 y.o. [name] [name] Primary Care Provider: Dustin [name] Other Clinician: Referring Provider: Treating Provider/Extender: [name] [name] Weeks in Treatment: 139 History of Present Illness HPI Description: this patient has been seen a couple of times before and returns with recurrent problems to her right and left lower extremity with swelling and weeping ulcerations due to not wearing her compression stockings which she had been advised to do during her last discharge, at the end of June 2018. During her last visit the patient had had normal arterial blood flow and her venous reflux study did not necessitate any surgical intervention. She was recommended compression and elevation and wound care. After prolonged treatment the patient was completely healed but she has been noncompliant with wearing or compressions.. She was here last week with an outpatient return visit planned but the patient came in a very poor general condition with  altered mental status and was rushed to the ER on my request. With a history of hypertension, diabetes, TIA and right-sided weakness she was set up for an MRI on her brain and cervical spine and was sent to [facility]. Getting an MRI done was very difficult but once the workup was done she was found not to have any spinal stenosis, epidural abscess or hematoma or discitis. This was radiculopathy to be treated as an outpatient and she was given a follow-up appointment. Today she is feeling much better alert and oriented and has come to reevaluate her bilateral lower extremity lymphedema and ulceration 03/25/2017 -- she was admitted to the hospital on 03/16/2017 and discharged on 03/18/2017 with left leg cellulitis and ulceration. She was started on vancomycin and Zosyn and x-ray showed no bony involvement. She was treated for a cellulitis with IV antibiotics changed to Rocephin and Flagyl and was discharged on oral Keflex and doxycycline to complete a 7 day course. Last hemoglobin A1c was 7.1 and her other ailments including hypertension got asthma were appropriately treated. 05/06/2017 -- she is awaiting the right size of compression stockings from [facility] but other than that has been doing well. ====== Old notes 69 year old patient was seen one time last October and was lost to follow-up. She has recurrent problems with weeping and ulceration of her left lower extremity and has swelling of this for several years. It has been worse for the last 2 months. Past medical history is significant for diabetes mellitus type 2, hypertension, gout, morbid obesity, depressive disorders, hiatal hernia, migraines, status post knee surgery, risk of a cholecystectomy, vaginal hysterectomy and breast biopsy. She is not a smoker. As noted before she has never had a venous duplex study and an arterial ABI study was attempted but the left lower extremity was noncompressible 10/01/2016 -- had a lower extremity venous  duplex reflux evaluation which showed  no evidence of deep vein reflux in the right or left lower extremity, and no evidence of great saphenous vein reflux more than 500 ms in the right or left lower extremity, and the left small saphenous vein is incompetent but no vascular consult was recommended. review of her electronic medical records noted that the ABI was checked in July 2017 where the right ABI was normal limits and the left ABI could not be ascertained due to pain with cuff pressure but the waveforms are within normal limits. her arterial duplex study scheduled for April 27. 10/08/2016 -- the patient has various reasons for not having a compression on and for the last 3 days she has had no compression on her left lower extremity either due to pain or the lack of nursing help. She does not use her juxta lites either. 10/15/2016 -- the patient did not keep her appointment for arterial duplex study on April 27 and I have asked her to reschedule this. Her pain is out of proportion with the physical findings and she continuously fails to wear a compression wraps and cuts them off because she says she cannot tolerate the pain. She does not use her juxta lites either. 10/22/2016 -- he has rescheduled her arterial duplex study to May 21 and her pain today is a bit better. She has not been wearing her juxta lites on her right lower extremity but now understands that she needs to do this. She did tolerate the to press compression wrap on her left lower extremity 10/29/2016 --arterial duplex study is scheduled for next week and overall she has been tolerating her compression wraps and also using her juxta lites on her right lower extremity 11/05/2016 -- the right ABI was 0.95 the left was 1.03. The digit TBI is on the right was 0.83 on the left was 0.92 and she had biphasic flow through these vessels. The impression was that of normal lower extremity arterial study. 11/12/2016 -- her pain is minimal  and she is doing very well overall. 11/26/2016 -- she has got juxta lites and her insurance will not pay for additional dual layer compression stockings. She is going to order some from [facility]. 05/12/2017 -- her juxta lites are very old and too big for her and these have not been helping with compression. She did get 20-30 mm compression stockings from [facility] but she and her husband are unable to put these on. I believe she will benefit from bilateral Extremit-ease, compression stockings and we will measure her for these today. 05/20/2017 -- lymphedema on the left lower extremity has increased a lot and she has a open ulceration as a result of this. The right lower extremity is looking pretty good. She has decided to by the compression stockings herself and will get reimbursed by the home health, at a later date. 05/27/2017 -- her sciatica is bothering her a lot and she thought her left leg pain was caused due to the compression wrap and hence removed it and has significant lymphedema. There is no inflammation on this left lower extremity. 06/17/17 on evaluation today patient appears to be doing very well and in fact is completely healed in regard to her ulcerations. Unfortunately however she does have continued issues with lymphedema nonetheless. We did order compression garments for her unfortunately she states that the size that she received were large although we ordered medium. Obviously this means she is not getting the optimal compression. She does not have those with her today and therefore  we could not confirm and contact the company on her behalf. Nonetheless she does state that she is going to have her husband bring them by tomorrow so that we can verify and then get in touch with the company. No fevers, chills, nausea, or vomiting noted at this time. Overall patient is doing better otherwise and I'm pleased with the progress she has made. 07/01/17 on evaluation today patient appears to be  doing very well in regard to her bilateral lower extremity she does not have any openings at this point which is excellent news. Overall I'm pleased with how things have progressed up to this time. Since she is doing so well we did order her compression which we are seeing her today to ensure that it fits her properly and everything is doing well in that regard and then subsequently she will be discharged. ============ Old Notes: 03/31/16 patient presents today for evaluation concerning open wounds that she has over the left medial ankle region as well as the left dorsal foot. She has previously had this occur although it has been healed for a number of months after having this for about a year prior until her hospitalization on 01/05/16. At that point in time it appears that she was admitted to the hospital for left lower extremity cellulitis and was placed on vancomycin and Zosyn at that point. Eventually upon discharge on January 15, 2016 she was placed on doxycycline at that point in time. Later on 03/27/16 positive wound culture growing Escherichia coli this was switched to amoxicillin. Currently she tells me that she is having pain radiated to be a 7 out of 10 which can be as high as 10 out of 10 with palpation and manipulation of the wound. This wound appears to be mainly venous in nature due to the bilateral lower extremity venous stasis/lymphedema. This is definitely much worse on her left than the right side. She does have type 1 diabetes mellitus, hypertension, morbid obesity, and is wheelchair dependent.during the course of the hospital stay a blood culture was also obtained and fortunately appeared negative. She also had an x-ray of the tibia/fibula on the left which showed no acute bone abnormality. Her white blood cell count which was performed last on 03/25/16 was 7.3, hemoglobin 12.8, protein 7.1, albumin 3.0. Her urine culture appeared to be negative for any specific organisms. Patient  did have a left lower extremity venous duplex evaluation for DVT . This did not include venous reflux studies but fortunately was negative for DVT Patient also had arterial studies performed which revealed that she had a . normal ABI on the right though this was unable to be performed on the left secondary to pain that she was having around the ankle region due to the wound. However it was stated on report that she had biphasic pulses and apparently good blood flow. ========== 06/03/17 she is here in follow-up evaluation for right lower extremity ulcer. The right lower sure he has healed but she has reopened to the left medial malleolus and dorsal foot with weeping. She is waiting for new compression garments to arrive from home health, the previous compression garments were ill fitting. We will continue with compression bilaterally and follow-up in 2 weeks Readmission: 08/05/17 on evaluation today patient appears to be doing somewhat poorly in regard to her left lower extremity especially although the right lower extremity has a small area which may no longer be open. She has been having a lot of drainage from the  left lower extremity however he tells me that she has not been able to use the EXTREMIT-EASE Compression at this point. She states that she did better and was able to actually apply the Juxta-Lite compression although the wound that she has is too large and therefore really does not compress which is why she cannot wear it at this point. She has no one who can help her put it on regular basis her son can sometimes but he's not able to do it most of the time. I do believe that's why she has begun to weave and have issues as she is currently yet again. No fevers, chills, nausea, or vomiting noted at this time. Patient is no evidence of dementia. 08/12/17 on evaluation today patient appears to still be doing fairly well in regard to the draining areas/weeping areas at this point. With that  being said she unfortunately did go to the ER yesterday due to what was felt to be possibly a cellulitis. They place her on doxycycline by mouth and discharge her home. She definitely was not admitted. With that being said she states she has had more discomfort which has been unusual for her even compared to prior times and she's had infections.08/12/17 on evaluation today patient appears to still be doing fairly well in regard to the draining areas/weeping areas at this point. With that being said she unfortunately did go to the ER yesterday due to what was felt to be possibly a cellulitis. They place her on doxycycline by mouth and discharge her home. She definitely was not admitted. With that being said she states she has had more discomfort which has been unusual for her even compared to prior times and she's had infections. 08/19/17 put evaluation today patient tells me that she's been having a lot of what sounds to be neuropathic type pain in regard to her left lower extremity. She has been using over-the-counter topical bins again which some believe. That in order to apply the she actually remove the wrap we put on her last Wednesday on Thursday. Subsequently she has not had anything on compression wise since that time. The good news is a lot of the weeping areas appear to have closed at this point again I believe she would do better with compression but we are struggling to get her to actually use what she needs to at this point. No fevers, chills, nausea, or vomiting noted at this time. 09/03/17 on evaluation today patient appears to be doing okay in regard to her lower extremities in regard to the lymphedema and weeping. Fortunately she does not seem to show any signs of infection at this point she does have a little bit of weeping occurring in the right medial malleolus area. With that being said this does not appear to be too significant which is good news. 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 She has severe skin damage secondary to both of these features involving the dorsal left foot and medial left ankle and lower leg. Still has open areas in the left anterior foot. The area on the right closed over. She uses her own juxta light stockings. She does not have an arterial issue 09/16/17 on evaluation today patient actually appears to be doing excellent in regard to her bilateral lower extremity swelling. The Juxta-Lite compression wrap seem to be doing very well for her. She has not however been using the portion that goes over her foot.  Her left foot still is draining a little bit not nearly as significant as it has been in the past but still I do believe that she likely needs to utilize the full wrap including the foot portion of this will improve as well. She also has been apparently putting on a significant amount of Vaseline which also think is not helpful for her. I recommended that if she feels she needs something for moisturizer Eucerin will probably be better. 09/30/17 on evaluation today patient presents with several new open areas in regard to her left lower extremity although these appear to be minimal and mainly seem to be more moisture breakdown than anything. Fortunately she does not seem to have any evidence of infection which is great news. She has been tolerating the dressing changes without complication we are using silver alginate on the foot she has been using AB pads to have the legs and using her Juxta- Lite compression which seems to be controlling her swelling very well. Overall I'm pleased with the poor way she has progressed. 10/14/17 on evaluation today patient appears to be doing better in regard to her left lower extremity areas of weeping. She does still have some discomfort although in general this does not appear to be as macerated and I think it is progressing nicely. I do think she  still needs to wear the foot portion of her Juxta- Lite in order to get the most benefit from the wrap obviously. She states she understands. Fortunately there does not appear to be evidence of infection at this time which is great news. 10/28/17 on evaluation today patient appears to be doing excellent in regard to her left lower extremity. She has just a couple areas that are still open and seem to be causing any trouble whatsoever. For that reason I think that she is definitely headed in the right direction the spots are very tiny compared to what we have been dealing with in the past. 11/11/17 on evaluation today patient appears to have a right lateral lower extremity ulcer that has opened since I last saw her. She states this is where the home health nurse that was coming out remove the dressing without wetting the alginate first. Nonetheless I do not know if this is indeed the case or not but more importantly we have not ordered home help to be coming out for her wounds at all. I'm unsure as to why they are coming out and we're gonna have to check on this and get things situated in that regard. With that being said we currently really do not need them to be coming out as the patient has been taking care of her leg herself without complication and no issues. In fact she was doing much better prior to nursing coming out. 11/25/17 on evaluation today patient actually appears to be doing fairly well in regard to her left lower extremity swelling. In fact she has very little area of weeping at this point there's just a small spot on the lateral portion of her right leg that still has me just a little bit more concerned as far as wanting to see this clear up before I discharge her to caring for this at home. Nonetheless overall she has made excellent progress. 12/09/17 on evaluation today patient appears to be doing rather well in regard to her lower extremity edema. She does have some weeping still in the  left lower extremity although the big area we were taking care of two  weeks ago actually has closed and she has another area of weeping on the left lower extremity immediately as well is the top of her foot. She does not currently have lymphedema pumps she has been wearing her compression daily on a regular basis as directed. With that being said I think she may benefit from lymphedema pumps. 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 We had a very difficult time getting and keeping this under control. 12/23/17 on evaluation today patient actually appears to be doing a little bit more poorly in regard to her bilateral lower extremities. She has been tolerating the Juxta-Lite compression wraps. Unfortunately she has two new ulcers on the right lower extremity and left lower Trinity ulceration seems to be larger. Obviously this is not good news. She has been tolerating the dressings without complication. 12/30/17 on evaluation today patient actually appears to be doing much better in regard to her bilateral lower extremity edema. She continues to have some issues with ulcerations and in fact there appears to be one spot on each leg where the wrap may have caused a little bit of a blister which is subsequently opened up at this point is given her pain. Fortunately it does not appear to be any evidence of infection which is good news. No fevers chills noted. 01/13/18 on evaluation today patient appears to be doing rather well in regard to her bilateral lower extremities. The dressings did get kind of stuck as far as the wound beds are concerned but again I think this is mainly due to the fact that she actually seems to be showing signs of healing which is good news. She's not having as much drainage therefore she was having more of the dressing sticking.  Nonetheless overall I feel like her swelling is dramatically down compared to previous. 01/20/18 on evaluation today patient unfortunately though she's doing better in most regards has a large blister on the left anterior lower extremity where she is draining quite significantly. Subsequently this is going to need debridement today in order to see what's underneath and ensure she does not continue to trapping fluid at this location. Nonetheless No fevers, chills, nausea, or vomiting noted at this time. 01/27/18 on evaluation today patient appears to be doing rather well at this point in regard to her right lower extremity there's just a very small area that she still has open at this point. With that being said I do believe that she is tolerating the compression wraps very well in making good progress. Home health is coming out at this point to see her. Her left lower extremity on the lateral portion is actually what still mainly open and causing her some discomfort for the most part 02/10/18 on evaluation today patient actually appears to be doing very well in regard to her right lower extremity were all the ulcers appear to be completely close. In regard to the left lower extremity she does have two areas still open and some leaking from the dorsal surface of her foot but this still seems to be doing much better to me in general. 02/24/18 on evaluation today patient actually appears to be doing much better in regard to her right lower extremity this is still completely healed. Her left lower extremity is also doing much better fortunately she has no evidence of infection. The one  area that is gonna require some debridement is still on the left anterior shin. Fortunately this is not hurting her as badly today. 03/10/18 on evaluation today patient appears to be doing better in some regards although she has a little bit more open area on the dorsal foot and she also has some issues on the medial portion of  the left lower extremity which is actually new and somewhat deep. With that being said there fortunately does not appear to be any significant signs of infection which is good news. No fevers, chills, nausea, or vomiting noted at this time. In general her swelling seems to be doing fairly well which is good news. 03/31/18 on evaluation today patient presents for follow-up concerning her left lower extremity lymphedema. Unfortunately she has been doing a little bit more poorly since I last saw her in regard to the amount of weeping that she is experiencing. She's also having some increased pain in the anterior shin location. Unfortunately I do not feel like the patient is making such good progress at this point a few weeks back she was definitely doing much better. 04/07/18 on evaluation today patient actually appears to be showing some signs of improvement as far as the left lower extremity is concerned. She has been tolerating the dressing changes and it does appear that the Drawtex did better for her. With that being said unfortunately home health is stating that they cannot obtain the Drawtex going forward. Nonetheless we're gonna have to check and see what they may be able to get the alginate they were using was getting stuck in causing new areas of skin being pulled all that with and subsequently weep and calls her to worsen overall this is the first time we've seen improvement at this time. 04/14/18 on evaluation today patient actually appears to be doing rather well at this point there does not appear to be any evidence of infection at this time and she is actually doing excellent in regard to the weeping in fact she almost has no openings remaining even compared to just last week this is a dramatic improvement. No fevers chills noted 04/21/18 evaluation today patient actually appears to be doing very well. She in fact is has a small area on the posterior lower extremity location and she has  a small area on the dorsal surface of her foot that are still open both of which are very close to closing. We're hoping this will be close shortly. She brought her Juxta-Lite wrap with her today hoping that would be able to put her in it unfortunately I don't think were quite at that point yet but we're getting closer. 04/28/18 upon evaluation today patient actually appears to be doing excellent in regard to her left lower extremity ulcer. In fact the region on the posterior lower extremity actually is much smaller than previously noted. Overall I'm very happy with the progress she has made. She again did bring her Juxta-Lite although we're not quite ready for that yet. 05/11/18 upon evaluation today patient actually appears to be doing in general fairly well in regard to her left lower Trinity. The swelling is very well controlled. With that being said she has a new area on the left anterior lower extremity as well as between the first and second toes of her left foot that was not present during the last evaluation. The region of her posterior left lower extremity actually appears to be almost completely healed. T be honest I'm very pleased  with o the way that stands. Nonetheless I do believe that the lotion may be keeping the area to moist as far as her legs are concerned subsequently I'm gonna consider discontinuing that today. 05/26/18 on evaluation today patient appears to be doing rather well in regard to her left lower should be ulcers. In fact everything appears to be close except for a very small area on the left posterior lower extremity. Fortunately there does not appear to be any evidence of infection at this time. Overall very pleased with her progress. 06/02/18 and evaluation today patient actually appears to be doing very well in regard to her lower extremity ulcers. She has one small area that still continues to weep that I think may benefit her being able to justify lotion and user  Juxta-Lite wraps versus continued to wrap her. Nonetheless I think this is something we can definitely look into at this point. 06/23/18 on evaluation today patient unfortunately has openings of her bilateral lower extremities. In general she seems to be doing much worse than when I last saw her just as far as her overall health standpoint is concerned. She states that her discomfort is mainly due to neuropathy she's not having any other issues otherwise. No fevers, chills, nausea, or vomiting noted at this time. 06/30/18 on evaluation today patient actually appears to be doing a little worse in regard to her right lower extremity her left lower extremity of doing fairly well. Fortunately there is no sign of infection at this time. She has been tolerating the dressing changes without complication. Home health did not come out like they were supposed to for the appropriate wrap changes. They stated that they never received the orders from [place] which were fax. Nonetheless we will send a copy of the orders with the patient today as well. 07/07/18 on evaluation today patient appears to be doing much better in regard to lower extremities. She still has several openings bilaterally although since I last saw her her legs did show obvious signs of infection when she later saw her nurse. Subsequently a culture was obtained and she is been placed on Bactrim and Keflex. Fortunately things seem to be looking much better it does appear she likely had an infection. Again last week we'd even discussed it but again there really was not any obvious sign that she had infection therefore we held off on the antibiotics. Nonetheless I'm glad she's doing better today. 07/14/18 on evaluation today patient appears to be doing much better regarding her bilateral lower Trinity's. In fact on the right lower for me there's nothing open at this point there are some dry skin areas at the sites where she had infection. Fortunately there is  no evidence of systemic infection which is excellent news. No fevers chills noted 07/21/18 on evaluation today patient actually appears to be doing much better in regard to her left lower extremity ulcers. She is making good progress and overall I feel like she's improving each time I see her. She's having no pain I do feel like the infection is completely resolved which is excellent news. No fevers, chills, nausea, or vomiting noted at this time. 07/28/18 on evaluation today patient appears to be doing very well in regard to her left lower [employer]. Everything seems to be showing signs of improvement which is excellent news. Overall very pleased with the progress that has been made. Fortunately there's no evidence of active infection at this time also excellent news. 08/04/18 on evaluation  today patient appears to be doing more poorly in regard to her bilateral lower extremities. She has two new areas open up on the right and these were completely closed as of last week. She still has the two spots on the left which in my pinion seem to be doing better. Fortunately there's no evidence of infection again at this point. 08/11/18 on evaluation today patient actually appears to be doing very well in regard to her bilateral lower Trinity wounds that all seem to be doing better and are measures smaller today. Fortunately there's no signs of infection. No fevers, chills, nausea, or vomiting noted at this time. 08/18/18 on evaluation today patient actually appears to be doing about the same inverter bilateral lower extremities. She continues to have areas that blistering open as was drain that fortunately nothing too significant. Overall I feel like Drawtex may have done better for her however compared to the collagen. 08/25/18 on evaluation today patient appears to be doing a little bit more poorly today even compared to last time I saw her. Again I'm not exactly sure why she's making worse progress over  the past several weeks. I'm beginning to wonder if there is some kind of underlying low level infection causing this issue. I did actually take a culture from the left anterior lower extremity but it was a new wound draining quite a bit at this point. Unfortunately she also seems to be having more pain which is what also makes me worried about the possibility of infection. This is despite never erythema noted at this point. 09/01/18 on evaluation today patient actually appears to be doing a little worse even compared to last week in regard to bilateral lower extremities. She did go to the hospital on the 16th was given a dose of IV Zosyn and then discharged with a recommendation to continue with the Bactrim that I previously prescribed for her. Nonetheless she is still having a lot of discomfort she tells me as well at this time. This is definitely unfortunate. No fevers, chills, nausea, or vomiting noted at this time. 09/08/18 on evaluation today patient's bilateral lower extremities actually appear to be shown signs of improvement which is good news. Fortunately there does not appear to be any signs of active infection I think the anabiotic is helping in this regard. Overall I'm very pleased with how she is progressing. 09/15/18 patient was actually seen in ER yesterday due to her legs as well unfortunately. She states she's been having a lot of pain and discomfort as well as a lot of drainage. Upon inspection today the patient does have a lot of swelling and drainage I feel like this is more related to lymphedema and poor fluid control than it is to infection based on what I'm seeing. The physician in the emergency department also doubted that the patient was having a significant infection nonetheless I see no evidence of infection obvious at this point although I do see evidence of poor fluid control. She still not using a compression pumps, she is not elevating due to her lift chair as well as her  hospital bed being broken, and she really is not keeping her legs up as much as they should be and also has been taking off her wraps. All this combined I think has led to poor fluid control and to be honest she may be somewhat volume overloaded in general as well. I recommend that she may need to contact your physician to see if a  prescription for a diuretic would be beneficial in their opinion. As long as this is safe I think it would likely help her. 09/29/18 on evaluation today patient's left lower extremity actually appears to be doing quite a bit better. At least compared to last time that I saw her. She still has a large area where she is draining from but there's a lot of new skin speckled trout and in fact there's more new skin that there are open areas of weeping and drainage at this point. This is good news. With regard to the right lower extremity this is doing much better with the only open area that I really see being a dry spot on the right lateral ankle currently. Fortunately there's no signs of active infection at this time which is good news. No fevers, chills, nausea, or vomiting noted at this time. The patient seems somewhat stressed and overwhelmed during the visit today she was very lethargic as such. She does and she is not taking any pain medications at this point. Apparently according to her husband are also in the process of moving which is probably taking its toll on her as well. 10/06/18 on evaluation today patient appears to be doing rather well in regard to her lower extremities compared to last evaluation. Fortunately there's no signs of active infection. She tells me she did have an appointment with her primary. Nonetheless he was concerned that the wounds were somewhat deep based on pictures but we never actually saw her legs. She states that he had her somewhat worried due to the fact that she was fearing now that she was [place] have to have an amputation. With that being  said based on what I'm seeing check she looks better this week that she has the last two times I've seen her with much less drainage I'm actually pleased in this regard. That doesn't mean that she's out of the water but again I do not think what the point of talking about education at all in regard to her leg. She is very happy to hear this. She is also not having as much pain as she was having last week. 10/13/18 unfortunately on evaluation today patient still continues to have a significant amount of drainage she's not letting home health actually apply the compression dressings at this point. She's trying to use of Juxta-Lite of the top of Kerlex and the second layer of the three layer compression wrap. With that being said she just does not seem to be making as good a progress as I would expect if she was having the compression applied and in place on a regular basis. No fevers, chills, nausea, or vomiting noted at this time. 10/20/18 on evaluation today patient appears to be doing a little better in regard to her bilateral lower extremity ulcers. In fact the right lower extremity seems to be healed she doesn't even have any openings at this point left lower extremity though still somewhat macerated seems to be showing signs of new skin growth at multiple locations throughout. Fortunately there's no evidence of active infection at this time. No fevers, chills, nausea, or vomiting noted at this time. 10/27/18 on evaluation today patient appears to be doing much better in regard to her left lower Trinity ulcer. She's been tolerating the laptop complication and has minimal drainage noted at this point. Fortunately there's no signs of active infection at this time. No fevers, chills, nausea, or vomiting noted at this time. 11/03/18 on evaluation today  patient actually appears to be doing excellent in regard to her left lower extremity. She is having very little drainage at this point there does not appear  to be any significant signs of infection overall very pleased with how things have gone. She is likewise extremely pleased still and seems to be making wonderful progress week to week. I do believe antibiotics were helpful for her. Her primary care provider did place on amateur clean since I last saw her. 11/17/18 on evaluation today patient appears to be doing worse in regard to her bilateral lower extremities at this point. She is been tolerating the dressing changes without complication. With that being said she typically takes the Coban off fairly quickly upon arriving home even after being seen here in the clinic and does not allow home health reapply command as part of the dressing at home. Therefore she said no compression essentially since I last saw her as best I can tell. With that being said I think it shows and how much swelling she has in the open wounds that are noted at this point. Fortunately there's no signs of infection but unfortunately if she doesn't get this under control I think she will end up with infection and more significant issues. 11/24/18 on evaluation today patient actually appears to be doing somewhat better in regard to her bilateral lower extremities. 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 Nonetheless that is doing better currently so she should be able to attempt reinitiating the lymphedema pumps at this time. No fevers, chills, nausea, or vomiting noted at this time. 12/01/18 upon evaluation today patient's left lower extremity appears to be doing quite well unfortunately her right lower extremity is not doing nearly as well. She has been tolerating the dressing changes without complication unfortunately she did not keep a wrap on the right at this time. Nonetheless I believe this has led to increased swelling and weeping in  the world is actually much larger than during the last evaluation with her. 12/08/18 on evaluation today patient appears to be doing about the same at this point in regard to her right lower extremity. There is some more palatable to touch I'm concerned about the possibility of there being some infection although I think the main issue is she's not keeping her compression wrap on which in turn is not allowing this area to heal appropriately. 12/22/18 on evaluation today patient appears to be doing better in regard to left lower extremity unfortunately significantly worse in regard to the right lower extremity. The areas of blistering and necrotic superficial tissue have spread and again this does not really appear to be signs of infection and all she just doesn't seem to be doing nearly as well is what she has been in the past. Overall I feel like the Augmentin did absolutely nothing for her she doesn't seem to have any infection again I really didn't think so last time either is more of a potential preventative measure and hoping that this would make some difference but I think the main issue is she's not wearing her compression. She tells me she cannot wear the [place] been we put on she takes it off pretty much upon getting home. Subsequently she worshiped Juxta-Lite when I questioned her about how often she wears it this is no  more than three hours a day obviously that leaves 21 hours that she has no compression and this is obviously not doing well for her. Overall I'm concerned that if things continue to worsen she is at great risk of both infection as well as losing her leg. 01/05/19 on evaluation today patient appears to be doing well in regard to her left lower extremity which he is allowing [place] to wrap and not so well with regard to her right lower extremity which she is not allowing [place] to really wrap and keep the wrap on. She states that it hurts too badly whenever it's wrapped and she ends up  having to take it off. She's been using the Juxta-Lite she tells me up to six hours a day although I question whether or not that's really been the case to be honest. Previously she told me three hours today nonetheless obviously the legs as long as the wrap is doing great when she is not is doing much more poorly. 01/12/2019 on evaluation today patient actually appears to be doing a little better in my opinion with regard to her right lower extremity ulcer. She has a small open area on the left lower extremity unfortunately but again this I think is part of the normal fluctuation of what she is going to have to expect with regard to her legs especially when she is not using her lymphedema pumps on a regular basis. Subsequently based on what I am seeing today I think that she does seem to be doing slightly better with regard to her right lower extremity she did see her primary care provider on Monday they felt she had an infection and placed her on 2 antibiotics. Both Cipro and clindamycin. Subsequently again she seems possibly to be doing a little bit better in regards to the right lower extremity she also tells me however she has been wearing the compression wrap over the past week since I spoke with her as well that is a Kerlix and Coban wrap on the right. No fevers, chills, nausea, vomiting, or diarrhea. 01/19/2019 on evaluation today patient appears to be doing better with regard to her bilateral lower extremities especially the right. I feel like the compression has been beneficial for her which is great news. She did get a call from her primary care provider on her way here today telling her that she did have methicillin- resistant Staphylococcus aureus and he was calling in a couple new antibiotics for her including a ointment to be applied she tells me 3 times a day. With that being said this sounds like likely to be Bactroban which I think could be applied with each dressing/wrap change but I  would not be able to accommodate her applying this 3 times a day. She is in agreement with the least doing this we will add that to her orders today. 01/26/2019 on evaluation today patient actually appears to be doing much better with regard to her right lower extremity. Her left lower extremity is also doing quite well all things considering. Fortunately there is no evidence of active infection at this time. No fevers, chills, nausea, vomiting, or diarrhea. 02/02/2019 on evaluation today patient appears to be doing much better compared to her last evaluation. Little by little off like her right leg is returning more towards normal. There does not appear to be any signs of active infection and overall she seems to be doing quite well which is great news. I am very pleased  in this regard. No fevers, chills, nausea, vomiting, or diarrhea. 02/09/2019 upon evaluation today patient appears to be doing better with regard to her bilateral lower extremities. She has been tolerating the dressing changes without complication. Fortunately there is no signs of active infection at this time. No fevers, chills, nausea, vomiting, or diarrhea. 02/23/2019 on evaluation today patient actually appears to be doing quite well with regard to her bilateral lower extremities. She has been tolerating the dressing changes without complication. She is even used her pumps one time and states that she really felt like it felt good. With that being said she seems to be in good spirits and her legs appear to be doing excellent. 03/09/2019 on evaluation today patient appears to be doing well with regard to her right lower extremity there are no open wounds at this time she is having some discomfort but I feel like this is more neuropathy than anything. With regard to her left lower extremity she had several areas scattered around that she does have some weeping and drainage from but again overall she does not appear to be having any  significant issues and no evidence of infection at this time which is good news. 03/23/2019 on evaluation today patient appears to be doing well with regard to her right lower extremity which she tells me is still close she is using her juxta light here. Her left lower extremity she mainly just has an area on the foot which is still slightly draining although this also is doing great. Overall very pleased at this time. 04/06/2019 patient appears to be doing a little bit worse in regard to her left lower extremity upon evaluation today. She feels like this could be becoming infected again which she had issues with previous. Fortunately there is no signs of systemic infection but again this is always a struggle with her with her legs she will go from doing well to not so well in a very short amount of time. 04/20/2019 on evaluation today patient actually appears to be doing quite well with regard to her right lower extremity I do not see any signs of active infection at this time. Fortunately there is no fever chills noted. She is still taking the antibiotics which I prescribed for her at this point. In regard to the left lower extremity I do feel like some of these areas are better although again she still is having weeping from several locations at this time. 04/27/2019 on evaluation today patient appears to be doing about the same if not slightly worse in regard to her left lower extremity ulcers. She tells me when questioned that she has been sleeping in her Hoveround chair in fact she tells me she falls asleep without even knowing it. I think she is spending a whole lot of time in the chair and less time walking and moving around which is not good for her legs either. On top of that she is in a seated position which is also the worst position she is not really elevating her legs and she is also not using her lymphedema pumps. All this is good to contribute to worsening of her condition in  general. 05/18/2019 on evaluation today patient appears to be doing well with regard to her lower extremity on the right in fact this is showing no signs of any open wounds at this time. On the left she is continuing to have issues with areas that do drain. Some of the regions have healed and  there are couple areas that have reopened. She did go to the ER per the patient according to recommendations from the home health nurse due to what she was seen when she came out on 05/13/2019. Subsequently she felt like the patient needed to go to the hospital due to the fact that again she was having "milky white discharge" from her leg. Nonetheless she had and then was placed on doxycycline and subsequently seems to be doing better. 06/01/2019 upon evaluation today patient appears to be doing really in my opinion about the same. I do not see any signs of active infection which is good news. Overall she still has wounds over the bilateral lower extremities she has reopened on the right but this appears to be more of a crack where there is weeping/edema coming from the region. I do not see any evidence of infection at either site based on what I visualized today. 07/13/2019 upon evaluation today patient appears to be doing a little worse compared to last time I saw her. She since has been in the hospital from 06/21/2019 through 06/29/2019. This was secondary to having Covid. During that time they did apply lotion to her legs which unfortunately has caused her to develop a myriad of open wounds on her lower extremities. Her legs do appear to be doing better as far as the overall appearance is concerned but nonetheless she does have more open and weeping areas. 07/27/2019 upon evaluation today patient appears to be doing more poorly to be honest in regard to her left lower extremity in particular. There is no signs of systemic infection although I do believe she may have local infection. She notes she has been having  a lot of blue/green drainage which is consistent potentially with Pseudomonas. That may be something that we need to consider here as well. The doxycycline does not seem to have been helping. 08/03/2019 upon evaluation today patient appears to be doing a little better in my opinion compared to last week's evaluation. Her culture I did review today and she is on appropriate medications to help treat the Enterobacter that was noted. Overall I feel like that is good news. With that being said she is unfortunately continuing to have a lot of drainage and though it is doing better I still think she has a long ways to go to get things dried up in general. Fortunately there is no signs of systemic infection. 08/10/2019 upon evaluation today patient appears to be doing may be slightly better in regard to her left lower extremity the right lower extremity is doing much better. Fortunately there is no signs of infection right now which is good news. No fevers, chills, nausea, vomiting, or diarrhea. 08/24/2019 on evaluation today patient appears to be doing slightly better in regard to her lower extremities. The left lower extremity seems to be healed the right lower extremity is doing better though not completely healed as far as the openings are concerned. She has some generalized issues here with edema and weeping secondary to her lymphedema though again I do believe this is little bit drier compared to prior weeks evaluations. In general I am very pleased with how things seem to be progressing. No fevers, chills, nausea, vomiting, or diarrhea. 08/31/2019 upon evaluation today patient actually seems to making some progress here with regard to the left lower extremity in particular. She has been tolerating the dressing changes without complication. Fortunately there is no signs of active infection at this time. No fevers,  chills, nausea, vomiting, or diarrhea. She did see Dr. Doren Custard and he did note that she did have  a issue with the left great saphenous vein and the small saphenous vein in the leg. With that being said he was concerned about the possibility of laser ablation not being extremely successful. He also mentioned a small risk of DVT associated with the procedure. However if the wounds do not continue to improve he stated that that would probably be the way to go. Fortunately the patient's legs do seem to be doing much better. 09/07/2019 upon evaluation today patient appears to be doing better with regard to her lower extremities. She has been tolerating the dressing changes without complication. With that being said she is showing signs of improvement and overall very pleased. There are some areas on her leg that I think we do need to debride we discussed this last week the patient is in agreement with doing that as long as it does not hurt too badly. 09/14/2019 upon evaluation today patient appears to be doing decently well with regard to her left lower extremity. She is not having near as much weeping as she has had in the past things seem to be drying up which is good news. There is no signs of active infection at this time. 09/21/19 upon evaluation today patient appears to be doing better in regard overall to her bilateral lower extremities. She again has less open than she did previous and each week I feel like this is getting better. Fortunately there is no signs of active infection at this time. No fevers, chills, nausea, vomiting, or diarrhea. 09/28/2019 upon evaluation today patient actually appears to be showing signs of improvement with regard to her left lower extremity. Unfortunately the right medial lower extremity around the ankle region has reopened to some degree but this appears to be minimal still which is good news. There is no signs of active infection at this time which is also good news. 10/12/2019 upon evaluation today patient appears to be doing okay with regard to her bilateral  lower extremities today. The right is a little bit worse then last evaluation 2 weeks ago. The left is actually doing a little better in my opinion. Overall there is no signs of active infection at this time that I see. Obviously that something we have to keep a close eye on she is very prone to this with the significant and multiple openings that she has over the bilateral lower extremities. 10/19/2019 upon evaluation today patient appears to be doing about the best that I have seen her in quite some time. She has been tolerating the dressing changes without complication. There does not appear to be any signs of active infection and overall I am extremely happy with the way her legs appeared. She is drying up quite nicely and overall is having less pain. 11/09/2019 upon evaluation today patient appears to be doing better in regard to her wounds. She seems to be drying up more and more each time I see her this is just taking a very long time. Fortunately there is no signs of active infection at this time. 11/23/2019 upon evaluation today patient actually appears to be doing excellent in regard to her lower extremities at this point compared to where she has been. Fortunately there is no signs of active infection at this time. She did go to the hospital last week for nausea and vomiting completely unrelated to her wounds. Fortunately she is doing better  she was given some Reglan and got better. She had associated abdominal pain but they never found out what was going on. 12/07/2019 upon evaluation today patient appears to be doing well for the most part in regard to her legs. She unfortunately has not been keeping the [place] portion of her wraps on therefore the compression has not really been sufficient for what it is supposed to be. Nonetheless she tells me that it just hurt too bad therefore she removed it. 12/21/2019 upon evaluation today patient actually appears to be doing quite well with regard to her  legs. I do feel like she has been making progress which is great news and overall there is no signs of active infection at this time. No fevers, chills, nausea, vomiting, or diarrhea. 01/04/2020 upon evaluation today patient presents for follow-up concerning her lower extremity edema bilaterally. She still has open wounds she has not been using her lymphedema pumps. She is also not been utilizing her compression wraps appropriately she tends to unwrap them, take them off, or states that they hurt. Obviously the reason they hurt is because her legs start to swell but the issue is if she would use her compression/lymphedema pumps regularly she would not swell and she would have the pain. Nonetheless she has not even picked them up once honestly over the past several months and may be even as much as in the past year based on my opinion and what have seen. She tells me today that after last week when I talked about this with her specifically actually that was 2 weeks ago that she "forgot". 8//21 on evaluation today patient appears to be doing a little better in regard to her legs bilaterally. Fortunately there is no signs of active infection at this time. She tells me that she used her lymphedema pumps all of one time over the past 2 weeks since I last saw her. She tells me that she has been too busy in order to continue to use these. 02/01/2020 on evaluation today patient appears to be doing some better in regard to her wounds in general in her legs. We felt the right was healed although is not completely it does appear to be doing better she tells me she has been using her lymphedema pumps that she has had this six times since I last saw her. Obviously the more she does that the better she would do my opinion 02/15/2020 upon evaluation today patient appears to be doing about the same in regard to her legs. She tells me that she is pumping I'm still not sure how much she does to be perfectly honest. However  even if she does a little bit here and there I guess that is better than nothing. Fortunately there is no sign of active infection at this time which is great news. No fevers, chills, nausea, vomiting, or diarrhea. 02/29/2020 on evaluation today patient actually appears to be doing quite well all things considered this week. She has been tolerating the dressing changes without complication. Fortunately there is no signs of active infection at this time. No fevers, chills, nausea, vomiting, or diarrhea. 03/14/2020 upon evaluation today patient appears to be doing really about the same in regard to her legs. There is no signs of improvement overall and she as noted from home health does not appear to be elevating her legs he can get into her lift chair. There is too much stuff piled up on it the patient tells me. She also  tells me she cannot really use her pumps effectively due to the fact that she cannot have any space to get them on. Finally she is also not really elevating her legs because she is not sleeping in her bed she is sleeping in her chair currently and again overall I think everything that she is done in combination has been exactly the wrong thing for what she needs for her legs. 04/04/2020 upon evaluation today patient appears to be doing well at this time with regard to her legs. She is actually been pumping, keeping her wraps on, and to be honest she seems to be doing dramatically better the right leg is excellent the left leg is also excellent and measuring much smaller than previous. Electronic Signature(s) Signed: 04/04/2020 11:16:53 AM By: [name] Keeler PA-C Entered By: [name] [name] on 04/04/2020 11:16:52 -------------------------------------------------------------------------------- Physical Exam Details Patient Name: Date of Service: [name], [name] 04/04/2020 10:15 A M Medical Record Number: [id number] Patient Account Number: [id number] Date of Birth/Sex: Treating  RN: 1951-03-17 (69 y.o. [name] [name] Primary Care Provider: Dustin [name] Other Clinician: Referring Provider: Treating Provider/Extender: [name] [name] Weeks in Treatment: 78 Constitutional Well-nourished and well-hydrated in no acute distress. Respiratory normal breathing without difficulty. Psychiatric this patient is able to make decisions and demonstrates good insight into disease process. Alert and Oriented x 3. pleasant and cooperative. Notes Patient's legs bilaterally both appear to be doing significantly better with regard to the overall healing at this time. There does not appear to be any signs of active infection which is great news and overall very pleased with what we are seeing at this point. I do believe her to keep in her wraps on, elevating her legs, and using her pumps has gone a long way to getting this under control I am very pleased she is doing all this. Electronic Signature(s) Signed: 04/04/2020 11:17:18 AM By: [name] Keeler PA-C Entered By: [name] [name] on 04/04/2020 11:17:17 -------------------------------------------------------------------------------- Physician Orders Details Patient Name: Date of Service: [name] [name]. 04/04/2020 10:15 A M Medical Record Number: [id number] Patient Account Number: [id number] Date of Birth/Sex: Treating RN: 1950/10/08 (68 y.o. [name] [name] Primary Care Provider: Dustin [name] Other Clinician: Referring Provider: Treating Provider/Extender: [name] [name] Weeks in Treatment: [phone] Verbal / Phone Orders: No Diagnosis Coding ICD-10 Coding Code Description E11.622 Type 2 diabetes mellitus with other skin ulcer I89.0 Lymphedema, not elsewhere classified I87.331 Chronic venous hypertension (idiopathic) with ulcer and inflammation of right lower extremity I87.332 Chronic venous hypertension (idiopathic) with ulcer and inflammation of left lower extremity L97.812 Non-pressure  chronic ulcer of other part of right lower leg with fat layer exposed L97.822 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 Dressing Change Frequency Wound #61 Left,Circumferential Lower Leg Change dressing three times week. Wound #64 Left,Dorsal Foot Change dressing three times week. Skin Barriers/Peri-Wound Care Moisturizing lotion - to dry skin Wound Cleansing Clean wound with Wound Cleanser - wash legs with soap and water with dressing changes May shower with protection. Primary Wound Dressing Wound #61 Left,Circumferential Lower Leg Calcium Alginate with Silver Wound #64 Left,Dorsal Foot Calcium Alginate with Silver Wound #66 Right,Anterior Lower Leg Calcium Alginate with Silver Secondary Dressing  Wound #61 Left,Circumferential Lower Leg Dry Gauze ABD pad - as needed Wound #64 Left,Dorsal Foot Dry Gauze ABD pad - as needed Wound #66 Right,Anterior Lower Leg Dry Gauze ABD pad - as needed Edema Control 3 Layer Compression System - Bilateral void standing for long periods of time - walking is encouraged A Elevate legs to the level of the heart or above for 30 minutes daily and/or when sitting, a frequency of: - do not sleep in chair with feet dangling, MUST elevate legs in recliner while sitting Exercise regularly Segmental Compressive Device. - lymphedema pumps 60 minutes 1- 2 times per day Off-Loading Turn and reposition every 2 hours Additional Orders / Instructions Follow Nutritious Diet - T include vitamin A, vitamin C, and Zinc along with increased protein intake. o [facility] for wound care. -  [facility] Electronic Signature(s) Signed: 04/04/2020 4:35:17 PM By: [name] Keeler PA-C Signed: 04/04/2020 4:55:29 PM By: [name] RN, BSN Entered By: [name] on 04/04/2020 11:14:56 -------------------------------------------------------------------------------- Problem List Details Patient Name: Date of Service: [name] [name]. 04/04/2020 10:15 A M Medical Record Number: [id number] Patient Account Number: [id number] Date of Birth/Sex: Treating RN: July 19, 1950 (69 y.o. [name] [name] Primary Care Provider: Dustin [name] Other Clinician: Referring Provider: Treating Provider/Extender: [name] [name] Weeks in Treatment: [phone] Active Problems ICD-10 Encounter Code Description Active Date MDM Diagnosis E11.622 Type 2 diabetes mellitus with other skin ulcer 08/05/2017 No Yes I89.0 Lymphedema, not elsewhere classified 08/05/2017 No Yes I87.331 Chronic venous hypertension (idiopathic) with ulcer and inflammation of right 08/05/2017 No Yes lower extremity I87.332 Chronic venous hypertension (idiopathic) with ulcer and inflammation of left 08/05/2017 No Yes lower extremity L97.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 leg with fat layer 08/05/2017 No Yes exposed L97.822 Non-pressure chronic ulcer of other part of left lower leg with fat layer exposed2/20/2019 No Yes L97.522 Non-pressure chronic ulcer of other part of left foot with fat layer exposed 06/01/2019 No Yes I10 Essential (primary) hypertension 08/05/2017 No Yes E66.01 Morbid (severe) obesity due to excess calories 08/05/2017 No Yes F41.8 Other specified anxiety disorders 08/05/2017 No Yes R53.1 Weakness 08/05/2017 No Yes Inactive Problems Resolved Problems Electronic Signature(s) Signed: 04/04/2020 10:28:09 AM By: [name] Keeler PA-C Entered By: [name] [name] on 04/04/2020 10:28:09 -------------------------------------------------------------------------------- Progress Note  Details Patient Name: Date of Service: [name] [name]. 04/04/2020 10:15 A M Medical Record Number: [id number] Patient Account Number: [id number] Date of Birth/Sex: Treating RN: 1951-03-08 (69 y.o. [name] [name] Primary Care Provider: Dustin [name] Other Clinician: Referring Provider: Treating Provider/Extender: [name] [name] Weeks in Treatment: [phone] Subjective Chief Complaint Information obtained from Patient Bilateral reoccurring LE ulcers History of Present Illness (HPI) this patient has been seen a couple of times before and returns with recurrent problems to her right and left lower extremity with swelling and weeping ulcerations due to not wearing her compression stockings which she had been advised to do during her last discharge, at the end of June 2018. During her last visit the patient had had normal arterial blood flow and her venous reflux study did not necessitate any surgical intervention. She was recommended compression and elevation and wound care. After prolonged treatment the patient was completely healed but she has been noncompliant with wearing or compressions.. She was here last week with an outpatient return visit planned but the patient came in a very poor general condition with altered mental status and was rushed to the  ER on my request. With a history of hypertension, diabetes, TIA and right-sided weakness she was set up for an MRI on her brain and cervical spine and was sent to [facility]. Getting an MRI done was very difficult but once the workup was done she was found not to have any spinal stenosis, epidural abscess or hematoma or discitis. This was radiculopathy to be treated as an outpatient and she was given a follow-up appointment. Today she is feeling much better alert and oriented and has come to reevaluate her bilateral lower extremity lymphedema and ulceration 03/25/2017 -- she was admitted to the hospital on 03/16/2017 and discharged  on 03/18/2017 with left leg cellulitis and ulceration. She was started on vancomycin and Zosyn and x-ray showed no bony involvement. She was treated for a cellulitis with IV antibiotics changed to Rocephin and Flagyl and was discharged on oral Keflex and doxycycline to complete a 7 day course. Last hemoglobin A1c was 7.1 and her other ailments including hypertension got asthma were appropriately treated. 05/06/2017 -- she is awaiting the right size of compression stockings from [employer] but other than that has been doing well. ====== Old notes 69 year old patient was seen one time last October and was lost to follow-up. She has recurrent problems with weeping and ulceration of her left lower extremity and has swelling of this for several years. It has been worse for the last 2 months. Past medical history is significant for diabetes mellitus type 2, hypertension, gout, morbid obesity, depressive disorders, hiatal hernia, migraines, status post knee surgery, risk of a cholecystectomy, vaginal hysterectomy and breast biopsy. She is not a smoker. As noted before she has never had a venous duplex study and an arterial ABI study was attempted but the left lower extremity was noncompressible 10/01/2016 -- had a lower extremity venous duplex reflux evaluation which showed no evidence of deep vein reflux in the right or left lower extremity, and no evidence of great saphenous vein reflux more than 500 ms in the right or left lower extremity, and the left small saphenous vein is incompetent but no vascular consult was recommended. review of her electronic medical records noted that the ABI was checked in July 2017 where the right ABI was normal limits and the left ABI could not be ascertained due to pain with cuff pressure but the waveforms are within normal limits. her arterial duplex study scheduled for April 27. 10/08/2016 -- the patient has various reasons for not having a compression on and for the  last 3 days she has had no compression on her left lower extremity either due to pain or the lack of nursing help. She does not use her juxta lites either. 10/15/2016 -- the patient did not keep her appointment for arterial duplex study on April 27 and I have asked her to reschedule this. Her pain is out of proportion with the physical findings and she continuously fails to wear a compression wraps and cuts them off because she says she cannot tolerate the pain. She does not use her juxta lites either. 10/22/2016 -- he has rescheduled her arterial duplex study to May 21 and her pain today is a bit better. She has not been wearing her juxta lites on her right lower extremity but now understands that she needs to do this. She did tolerate the to press compression wrap on her left lower extremity 10/29/2016 --arterial duplex study is scheduled for next week and overall she has been tolerating her compression wraps and also  using her juxta lites on her right lower extremity 11/05/2016 -- the right ABI was 0.95 the left was 1.03. The digit TBI is on the right was 0.83 on the left was 0.92 and she had biphasic flow through these vessels. The impression was that of normal lower extremity arterial study. 11/12/2016 -- her pain is minimal and she is doing very well overall. 11/26/2016 -- she has got juxta lites and her insurance will not pay for additional dual layer compression stockings. She is going to order some from [facility]. 05/12/2017 -- her juxta lites are very old and too big for her and these have not been helping with compression. She did get 20-30 mm compression stockings from [place] but she and her husband are unable to put these on. I believe she will benefit from bilateral Extremit-ease, compression stockings and we will measure her for these today. 05/20/2017 -- lymphedema on the left lower extremity has increased a lot and she has a open ulceration as a result of this. The right lower  extremity is looking pretty good. She has decided to by the compression stockings herself and will get reimbursed by the home health, at a later date. 05/27/2017 -- her sciatica is bothering her a lot and she thought her left leg pain was caused due to the compression wrap and hence removed it and has significant lymphedema. There is no inflammation on this left lower extremity. 06/17/17 on evaluation today patient appears to be doing very well and in fact is completely healed in regard to her ulcerations. Unfortunately however she does have continued issues with lymphedema nonetheless. We did order compression garments for her unfortunately she states that the size that she received were large although we ordered medium. Obviously this means she is not getting the optimal compression. She does not have those with her today and therefore we could not confirm and contact the company on her behalf. Nonetheless she does state that she is going to have her husband bring them by tomorrow so that we can verify and then get in touch with the company. No fevers, chills, nausea, or vomiting noted at this time. Overall patient is doing better otherwise and I'm pleased with the progress she has made. 07/01/17 on evaluation today patient appears to be doing very well in regard to her bilateral lower extremity she does not have any openings at this point which is excellent news. Overall I'm pleased with how things have progressed up to this time. Since she is doing so well we did order her compression which we are seeing her today to ensure that it fits her properly and everything is doing well in that regard and then subsequently she will be discharged. ============ Old Notes: 03/31/16 patient presents today for evaluation concerning open wounds that she has over the left medial ankle region as well as the left dorsal foot. She has previously had this occur although it has been healed for a number of months after  having this for about a year prior until her hospitalization on 01/05/16. At that point in time it appears that she was admitted to the hospital for left lower extremity cellulitis and was placed on vancomycin and Zosyn at that point. Eventually upon discharge on January 15, 2016 she was placed on doxycycline at that point in time. Later on 03/27/16 positive wound culture growing Escherichia coli this was switched to amoxicillin. Currently she tells me that she is having pain radiated to be a 7 out of 10  which can be as high as 10 out of 10 with palpation and manipulation of the wound. This wound appears to be mainly venous in nature due to the bilateral lower extremity venous stasis/lymphedema. This is definitely much worse on her left than the right side. She does have type 1 diabetes mellitus, hypertension, morbid obesity, and is wheelchair dependent.during the course of the hospital stay a blood culture was also obtained and fortunately appeared negative. She also had an x-ray of the tibia/fibula on the left which showed no acute bone abnormality. Her white blood cell count which was performed last on 03/25/16 was 7.3, hemoglobin 12.8, protein 7.1, albumin 3.0. Her urine culture appeared to be negative for any specific organisms. Patient did have a left lower extremity venous duplex evaluation for DVT . This did not include venous reflux studies but fortunately was negative for DVT Patient also had arterial studies performed which revealed that she had a . normal ABI on the right though this was unable to be performed on the left secondary to pain that she was having around the ankle region due to the wound. However it was stated on report that she had biphasic pulses and apparently good blood flow. ========== 06/03/17 she is here in follow-up evaluation for right lower extremity ulcer. The right lower sure he has healed but she has reopened to the left medial malleolus and dorsal foot with weeping.  She is waiting for new compression garments to arrive from home health, the previous compression garments were ill fitting. We will continue with compression bilaterally and follow-up in 2 weeks Readmission: 08/05/17 on evaluation today patient appears to be doing somewhat poorly in regard to her left lower extremity especially although the right lower extremity has a small area which may no longer be open. She has been having a lot of drainage from the left lower extremity however he tells me that she has not been able to use the EXTREMIT-EASE Compression at this point. She states that she did better and was able to actually apply the Juxta-Lite compression although the wound that she has is too large and therefore really does not compress which is why she cannot wear it at this point. She has no one who can help her put it on regular basis her son can sometimes but he's not able to do it most of the time. I do believe that's why she has begun to weave and have issues as she is currently yet again. No fevers, chills, nausea, or vomiting noted at this time. Patient is no evidence of dementia. 08/12/17 on evaluation today patient appears to still be doing fairly well in regard to the draining areas/weeping areas at this point. With that being said she unfortunately did go to the ER yesterday due to what was felt to be possibly a cellulitis. They place her on doxycycline by mouth and discharge her home. She definitely was not admitted. With that being said she states she has had more discomfort which has been unusual for her even compared to prior times and she's had infections.08/12/17 on evaluation today patient appears to still be doing fairly well in regard to the draining areas/weeping areas at this point. With that being said she unfortunately did go to the ER yesterday due to what was felt to be possibly a cellulitis. They place her on doxycycline by mouth and discharge her home. She definitely was  not admitted. With that being said she states she has had more discomfort which  has been unusual for her even compared to prior times and she's had infections. 08/19/17 put evaluation today patient tells me that she's been having a lot of what sounds to be neuropathic type pain in regard to her left lower extremity. She has been using over-the-counter topical bins again which some believe. That in order to apply the she actually remove the wrap we put on her last Wednesday on Thursday. Subsequently she has not had anything on compression wise since that time. The good news is a lot of the weeping areas appear to have closed at this point again I believe she would do better with compression but we are struggling to get her to actually use what she needs to at this point. No fevers, chills, nausea, or vomiting noted at this time. 09/03/17 on evaluation today patient appears to be doing okay in regard to her lower extremities in regard to the lymphedema and weeping. Fortunately she does not seem to show any signs of infection at this point she does have a little bit of weeping occurring in the right medial malleolus area. With that being said this does not appear to be too significant which is good news. 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 She has severe skin damage secondary to both of these features involving the dorsal left foot and medial left ankle and lower leg. Still has open areas in the left anterior foot. The area on the right closed over. She uses her own juxta light stockings. She does not have an arterial issue 09/16/17 on evaluation today patient actually appears to be doing excellent in regard to her bilateral lower extremity swelling. The Juxta-Lite compression wrap seem to be doing very well for her. She has not however been using the portion that goes over her foot. Her left foot still is draining a little bit not  nearly as significant as it has been in the past but still I do believe that she likely needs to utilize the full wrap including the foot portion of this will improve as well. She also has been apparently putting on a significant amount of Vaseline which also think is not helpful for her. I recommended that if she feels she needs something for moisturizer Eucerin will probably be better. 09/30/17 on evaluation today patient presents with several new open areas in regard to her left lower extremity although these appear to be minimal and mainly seem to be more moisture breakdown than anything. Fortunately she does not seem to have any evidence of infection which is great news. She has been tolerating the dressing changes without complication we are using silver alginate on the foot she has been using AB pads to have the legs and using her Juxta- Lite compression which seems to be controlling her swelling very well. Overall I'm pleased with the poor way she has progressed. 10/14/17 on evaluation today patient appears to be doing better in regard to her left lower extremity areas of weeping. She does still have some discomfort although in general this does not appear to be as macerated and I think it is progressing nicely. I do think she still needs to wear the foot portion of her Juxta- Lite in order to get the most benefit from the wrap obviously. She states she understands. Fortunately there does not appear to be evidence of infection at this time which is great news. 10/28/17 on evaluation today patient appears to be doing excellent  in regard to her left lower extremity. She has just a couple areas that are still open and seem to be causing any trouble whatsoever. For that reason I think that she is definitely headed in the right direction the spots are very tiny compared to what we have been dealing with in the past. 11/11/17 on evaluation today patient appears to have a right lateral lower extremity  ulcer that has opened since I last saw her. She states this is where the home health nurse that was coming out remove the dressing without wetting the alginate first. Nonetheless I do not know if this is indeed the case or not but more importantly we have not ordered home help to be coming out for her wounds at all. I'm unsure as to why they are coming out and we're gonna have to check on this and get things situated in that regard. With that being said we currently really do not need them to be coming out as the patient has been taking care of her leg herself without complication and no issues. In fact she was doing much better prior to nursing coming out. 11/25/17 on evaluation today patient actually appears to be doing fairly well in regard to her left lower extremity swelling. In fact she has very little area of weeping at this point there's just a small spot on the lateral portion of her right leg that still has me just a little bit more concerned as far as wanting to see this clear up before I discharge her to caring for this at home. Nonetheless overall she has made excellent progress. 12/09/17 on evaluation today patient appears to be doing rather well in regard to her lower extremity edema. She does have some weeping still in the left lower extremity although the big area we were taking care of two weeks ago actually has closed and she has another area of weeping on the left lower extremity immediately as well is the top of her foot. She does not currently have lymphedema pumps she has been wearing her compression daily on a regular basis as directed. With that being said I think she may benefit from lymphedema pumps. 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 We had a very difficult time getting and keeping this under control. 12/23/17 on  evaluation today patient actually appears to be doing a little bit more poorly in regard to her bilateral lower extremities. She has been tolerating the Juxta-Lite compression wraps. Unfortunately she has two new ulcers on the right lower extremity and left lower Trinity ulceration seems to be larger. Obviously this is not good news. She has been tolerating the dressings without complication. 12/30/17 on evaluation today patient actually appears to be doing much better in regard to her bilateral lower extremity edema. She continues to have some issues with ulcerations and in fact there appears to be one spot on each leg where the wrap may have caused a little bit of a blister which is subsequently opened up at this point is given her pain. Fortunately it does not appear to be any evidence of infection which is good news. No fevers chills noted. 01/13/18 on evaluation today patient appears to be doing rather well in regard to her bilateral lower extremities. The dressings did get kind of stuck as far as the wound beds  are concerned but again I think this is mainly due to the fact that she actually seems to be showing signs of healing which is good news. She's not having as much drainage therefore she was having more of the dressing sticking. Nonetheless overall I feel like her swelling is dramatically down compared to previous. 01/20/18 on evaluation today patient unfortunately though she's doing better in most regards has a large blister on the left anterior lower extremity where she is draining quite significantly. Subsequently this is going to need debridement today in order to see what's underneath and ensure she does not continue to trapping fluid at this location. Nonetheless No fevers, chills, nausea, or vomiting noted at this time. 01/27/18 on evaluation today patient appears to be doing rather well at this point in regard to her right lower extremity there's just a very small area that she still has  open at this point. With that being said I do believe that she is tolerating the compression wraps very well in making good progress. Home health is coming out at this point to see her. Her left lower extremity on the lateral portion is actually what still mainly open and causing her some discomfort for the most part 02/10/18 on evaluation today patient actually appears to be doing very well in regard to her right lower extremity were all the ulcers appear to be completely close. In regard to the left lower extremity she does have two areas still open and some leaking from the dorsal surface of her foot but this still seems to be doing much better to me in general. 02/24/18 on evaluation today patient actually appears to be doing much better in regard to her right lower extremity this is still completely healed. Her left lower extremity is also doing much better fortunately she has no evidence of infection. The one area that is gonna require some debridement is still on the left anterior shin. Fortunately this is not hurting her as badly today. 03/10/18 on evaluation today patient appears to be doing better in some regards although she has a little bit more open area on the dorsal foot and she also has some issues on the medial portion of the left lower extremity which is actually new and somewhat deep. With that being said there fortunately does not appear to be any significant signs of infection which is good news. No fevers, chills, nausea, or vomiting noted at this time. In general her swelling seems to be doing fairly well which is good news. 03/31/18 on evaluation today patient presents for follow-up concerning her left lower extremity lymphedema. Unfortunately she has been doing a little bit more poorly since I last saw her in regard to the amount of weeping that she is experiencing. She's also having some increased pain in the anterior shin location. Unfortunately I do not feel like the patient is  making such good progress at this point a few weeks back she was definitely doing much better. 04/07/18 on evaluation today patient actually appears to be showing some signs of improvement as far as the left lower extremity is concerned. She has been tolerating the dressing changes and it does appear that the Drawtex did better for her. With that being said unfortunately home health is stating that they cannot obtain the Drawtex going forward. Nonetheless we're gonna have to check and see what they may be able to get the alginate they were using was getting stuck in causing new areas of skin being pulled  all that with and subsequently weep and calls her to worsen overall this is the first time we've seen improvement at this time. 04/14/18 on evaluation today patient actually appears to be doing rather well at this point there does not appear to be any evidence of infection at this time and she is actually doing excellent in regard to the weeping in fact she almost has no openings remaining even compared to just last week this is a dramatic improvement. No fevers chills noted 04/21/18 evaluation today patient actually appears to be doing very well. She in fact is has a small area on the posterior lower extremity location and she has a small area on the dorsal surface of her foot that are still open both of which are very close to closing. We're hoping this will be close shortly. She brought her Juxta-Lite wrap with her today hoping that would be able to put her in it unfortunately I don't think were quite at that point yet but we're getting closer. 04/28/18 upon evaluation today patient actually appears to be doing excellent in regard to her left lower extremity ulcer. In fact the region on the posterior lower extremity actually is much smaller than previously noted. Overall I'm very happy with the progress she has made. She again did bring her Juxta-Lite although we're not quite ready for that  yet. 05/11/18 upon evaluation today patient actually appears to be doing in general fairly well in regard to her left lower Trinity. The swelling is very well controlled. With that being said she has a new area on the left anterior lower extremity as well as between the first and second toes of her left foot that was not present during the last evaluation. The region of her posterior left lower extremity actually appears to be almost completely healed. T be honest I'm very pleased with o the way that stands. Nonetheless I do believe that the lotion may be keeping the area to moist as far as her legs are concerned subsequently I'm gonna consider discontinuing that today. 05/26/18 on evaluation today patient appears to be doing rather well in regard to her left lower should be ulcers. In fact everything appears to be close except for a very small area on the left posterior lower extremity. Fortunately there does not appear to be any evidence of infection at this time. Overall very pleased with her progress. 06/02/18 and evaluation today patient actually appears to be doing very well in regard to her lower extremity ulcers. She has one small area that still continues to weep that I think may benefit her being able to justify lotion and user Juxta-Lite wraps versus continued to wrap her. Nonetheless I think this is something we can definitely look into at this point. 06/23/18 on evaluation today patient unfortunately has openings of her bilateral lower extremities. In general she seems to be doing much worse than when I last saw her just as far as her overall health standpoint is concerned. She states that her discomfort is mainly due to neuropathy she's not having any other issues otherwise. No fevers, chills, nausea, or vomiting noted at this time. 06/30/18 on evaluation today patient actually appears to be doing a little worse in regard to her right lower extremity her left lower extremity of doing  fairly well. Fortunately there is no sign of infection at this time. She has been tolerating the dressing changes without complication. Home health did not come out like they were supposed to for the  appropriate wrap changes. They stated that they never received the orders from [place] which were fax. Nonetheless we will send a copy of the orders with the patient today as well. 07/07/18 on evaluation today patient appears to be doing much better in regard to lower extremities. She still has several openings bilaterally although since I last saw her her legs did show obvious signs of infection when she later saw her nurse. Subsequently a culture was obtained and she is been placed on Bactrim and Keflex. Fortunately things seem to be looking much better it does appear she likely had an infection. Again last week we'd even discussed it but again there really was not any obvious sign that she had infection therefore we held off on the antibiotics. Nonetheless I'm glad she's doing better today. 07/14/18 on evaluation today patient appears to be doing much better regarding her bilateral lower Trinity's. In fact on the right lower for me there's nothing open at this point there are some dry skin areas at the sites where she had infection. Fortunately there is no evidence of systemic infection which is excellent news. No fevers chills noted 07/21/18 on evaluation today patient actually appears to be doing much better in regard to her left lower extremity ulcers. She is making good progress and overall I feel like she's improving each time I see her. She's having no pain I do feel like the infection is completely resolved which is excellent news. No fevers, chills, nausea, or vomiting noted at this time. 07/28/18 on evaluation today patient appears to be doing very well in regard to her left lower [employer]. Everything seems to be showing signs of improvement which is excellent news. Overall very pleased with the  progress that has been made. Fortunately there's no evidence of active infection at this time also excellent news. 08/04/18 on evaluation today patient appears to be doing more poorly in regard to her bilateral lower extremities. She has two new areas open up on the right and these were completely closed as of last week. She still has the two spots on the left which in my pinion seem to be doing better. Fortunately there's no evidence of infection again at this point. 08/11/18 on evaluation today patient actually appears to be doing very well in regard to her bilateral lower Trinity wounds that all seem to be doing better and are measures smaller today. Fortunately there's no signs of infection. No fevers, chills, nausea, or vomiting noted at this time. 08/18/18 on evaluation today patient actually appears to be doing about the same inverter bilateral lower extremities. She continues to have areas that blistering open as was drain that fortunately nothing too significant. Overall I feel like Drawtex may have done better for her however compared to the collagen. 08/25/18 on evaluation today patient appears to be doing a little bit more poorly today even compared to last time I saw her. Again I'm not exactly sure why she's making worse progress over the past several weeks. I'm beginning to wonder if there is some kind of underlying low level infection causing this issue. I did actually take a culture from the left anterior lower extremity but it was a new wound draining quite a bit at this point. Unfortunately she also seems to be having more pain which is what also makes me worried about the possibility of infection. This is despite never erythema noted at this point. 09/01/18 on evaluation today patient actually appears to be doing a little worse  even compared to last week in regard to bilateral lower extremities. She did go to the hospital on the 16th was given a dose of IV Zosyn and then discharged with a  recommendation to continue with the Bactrim that I previously prescribed for her. Nonetheless she is still having a lot of discomfort she tells me as well at this time. This is definitely unfortunate. No fevers, chills, nausea, or vomiting noted at this time. 09/08/18 on evaluation today patient's bilateral lower extremities actually appear to be shown signs of improvement which is good news. Fortunately there does not appear to be any signs of active infection I think the anabiotic is helping in this regard. Overall I'm very pleased with how she is progressing. 09/15/18 patient was actually seen in ER yesterday due to her legs as well unfortunately. She states she's been having a lot of pain and discomfort as well as a lot of drainage. Upon inspection today the patient does have a lot of swelling and drainage I feel like this is more related to lymphedema and poor fluid control than it is to infection based on what I'm seeing. The physician in the emergency department also doubted that the patient was having a significant infection nonetheless I see no evidence of infection obvious at this point although I do see evidence of poor fluid control. She still not using a compression pumps, she is not elevating due to her lift chair as well as her hospital bed being broken, and she really is not keeping her legs up as much as they should be and also has been taking off her wraps. All this combined I think has led to poor fluid control and to be honest she may be somewhat volume overloaded in general as well. I recommend that she may need to contact your physician to see if a prescription for a diuretic would be beneficial in their opinion. As long as this is safe I think it would likely help her. 09/29/18 on evaluation today patient's left lower extremity actually appears to be doing quite a bit better. At least compared to last time that I saw her. She still has a large area where she is draining from but  there's a lot of new skin speckled trout and in fact there's more new skin that there are open areas of weeping and drainage at this point. This is good news. With regard to the right lower extremity this is doing much better with the only open area that I really see being a dry spot on the right lateral ankle currently. Fortunately there's no signs of active infection at this time which is good news. No fevers, chills, nausea, or vomiting noted at this time. The patient seems somewhat stressed and overwhelmed during the visit today she was very lethargic as such. She does and she is not taking any pain medications at this point. Apparently according to her husband are also in the process of moving which is probably taking its toll on her as well. 10/06/18 on evaluation today patient appears to be doing rather well in regard to her lower extremities compared to last evaluation. Fortunately there's no signs of active infection. She tells me she did have an appointment with her primary. Nonetheless he was concerned that the wounds were somewhat deep based on pictures but we never actually saw her legs. She states that he had her somewhat worried due to the fact that she was fearing now that she was [place] have to  have an amputation. With that being said based on what I'm seeing check she looks better this week that she has the last two times I've seen her with much less drainage I'm actually pleased in this regard. That doesn't mean that she's out of the water but again I do not think what the point of talking about education at all in regard to her leg. She is very happy to hear this. She is also not having as much pain as she was having last week. 10/13/18 unfortunately on evaluation today patient still continues to have a significant amount of drainage she's not letting home health actually apply the compression dressings at this point. She's trying to use of Juxta-Lite of the top of Kerlex and the second  layer of the three layer compression wrap. With that being said she just does not seem to be making as good a progress as I would expect if she was having the compression applied and in place on a regular basis. No fevers, chills, nausea, or vomiting noted at this time. 10/20/18 on evaluation today patient appears to be doing a little better in regard to her bilateral lower extremity ulcers. In fact the right lower extremity seems to be healed she doesn't even have any openings at this point left lower extremity though still somewhat macerated seems to be showing signs of new skin growth at multiple locations throughout. Fortunately there's no evidence of active infection at this time. No fevers, chills, nausea, or vomiting noted at this time. 10/27/18 on evaluation today patient appears to be doing much better in regard to her left lower Trinity ulcer. She's been tolerating the laptop complication and has minimal drainage noted at this point. Fortunately there's no signs of active infection at this time. No fevers, chills, nausea, or vomiting noted at this time. 11/03/18 on evaluation today patient actually appears to be doing excellent in regard to her left lower extremity. She is having very little drainage at this point there does not appear to be any significant signs of infection overall very pleased with how things have gone. She is likewise extremely pleased still and seems to be making wonderful progress week to week. I do believe antibiotics were helpful for her. Her primary care provider did place on amateur clean since I last saw her. 11/17/18 on evaluation today patient appears to be doing worse in regard to her bilateral lower extremities at this point. She is been tolerating the dressing changes without complication. With that being said she typically takes the Coban off fairly quickly upon arriving home even after being seen here in the clinic and does not allow home health reapply command  as part of the dressing at home. Therefore she said no compression essentially since I last saw her as best I can tell. With that being said I think it shows and how much swelling she has in the open wounds that are noted at this point. Fortunately there's no signs of infection but unfortunately if she doesn't get this under control I think she will end up with infection and more significant issues. 11/24/18 on evaluation today patient actually appears to be doing somewhat better in regard to her bilateral lower extremities. 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 Nonetheless that is doing better currently so she should  be able to attempt reinitiating the lymphedema pumps at this time. No fevers, chills, nausea, or vomiting noted at this time. 12/01/18 upon evaluation today patient's left lower extremity appears to be doing quite well unfortunately her right lower extremity is not doing nearly as well. She has been tolerating the dressing changes without complication unfortunately she did not keep a wrap on the right at this time. Nonetheless I believe this has led to increased swelling and weeping in the world is actually much larger than during the last evaluation with her. 12/08/18 on evaluation today patient appears to be doing about the same at this point in regard to her right lower extremity. There is some more palatable to touch I'm concerned about the possibility of there being some infection although I think the main issue is she's not keeping her compression wrap on which in turn is not allowing this area to heal appropriately. 12/22/18 on evaluation today patient appears to be doing better in regard to left lower extremity unfortunately significantly worse in regard to the right lower extremity. The areas of blistering and necrotic superficial tissue  have spread and again this does not really appear to be signs of infection and all she just doesn't seem to be doing nearly as well is what she has been in the past. Overall I feel like the Augmentin did absolutely nothing for her she doesn't seem to have any infection again I really didn't think so last time either is more of a potential preventative measure and hoping that this would make some difference but I think the main issue is she's not wearing her compression. She tells me she cannot wear the [place] been we put on she takes it off pretty much upon getting home. Subsequently she worshiped Juxta-Lite when I questioned her about how often she wears it this is no more than three hours a day obviously that leaves 21 hours that she has no compression and this is obviously not doing well for her. Overall I'm concerned that if things continue to worsen she is at great risk of both infection as well as losing her leg. 01/05/19 on evaluation today patient appears to be doing well in regard to her left lower extremity which he is allowing [place] to wrap and not so well with regard to her right lower extremity which she is not allowing [place] to really wrap and keep the wrap on. She states that it hurts too badly whenever it's wrapped and she ends up having to take it off. She's been using the Juxta-Lite she tells me up to six hours a day although I question whether or not that's really been the case to be honest. Previously she told me three hours today nonetheless obviously the legs as long as the wrap is doing great when she is not is doing much more poorly. 01/12/2019 on evaluation today patient actually appears to be doing a little better in my opinion with regard to her right lower extremity ulcer. She has a small open area on the left lower extremity unfortunately but again this I think is part of the normal fluctuation of what she is going to have to expect with regard to her legs especially when she is  not using her lymphedema pumps on a regular basis. Subsequently based on what I am seeing today I think that she does seem to be doing slightly better with regard to her right lower extremity she did see her primary care provider on Monday  they felt she had an infection and placed her on 2 antibiotics. Both Cipro and clindamycin. Subsequently again she seems possibly to be doing a little bit better in regards to the right lower extremity she also tells me however she has been wearing the compression wrap over the past week since I spoke with her as well that is a Kerlix and Coban wrap on the right. No fevers, chills, nausea, vomiting, or diarrhea. 01/19/2019 on evaluation today patient appears to be doing better with regard to her bilateral lower extremities especially the right. I feel like the compression has been beneficial for her which is great news. She did get a call from her primary care provider on her way here today telling her that she did have methicillin- resistant Staphylococcus aureus and he was calling in a couple new antibiotics for her including a ointment to be applied she tells me 3 times a day. With that being said this sounds like likely to be Bactroban which I think could be applied with each dressing/wrap change but I would not be able to accommodate her applying this 3 times a day. She is in agreement with the least doing this we will add that to her orders today. 01/26/2019 on evaluation today patient actually appears to be doing much better with regard to her right lower extremity. Her left lower extremity is also doing quite well all things considering. Fortunately there is no evidence of active infection at this time. No fevers, chills, nausea, vomiting, or diarrhea. 02/02/2019 on evaluation today patient appears to be doing much better compared to her last evaluation. Little by little off like her right leg is returning more towards normal. There does not appear to be any signs  of active infection and overall she seems to be doing quite well which is great news. I am very pleased in this regard. No fevers, chills, nausea, vomiting, or diarrhea. 02/09/2019 upon evaluation today patient appears to be doing better with regard to her bilateral lower extremities. She has been tolerating the dressing changes without complication. Fortunately there is no signs of active infection at this time. No fevers, chills, nausea, vomiting, or diarrhea. 02/23/2019 on evaluation today patient actually appears to be doing quite well with regard to her bilateral lower extremities. She has been tolerating the dressing changes without complication. She is even used her pumps one time and states that she really felt like it felt good. With that being said she seems to be in good spirits and her legs appear to be doing excellent. 03/09/2019 on evaluation today patient appears to be doing well with regard to her right lower extremity there are no open wounds at this time she is having some discomfort but I feel like this is more neuropathy than anything. With regard to her left lower extremity she had several areas scattered around that she does have some weeping and drainage from but again overall she does not appear to be having any significant issues and no evidence of infection at this time which is good news. 03/23/2019 on evaluation today patient appears to be doing well with regard to her right lower extremity which she tells me is still close she is using her juxta light here. Her left lower extremity she mainly just has an area on the foot which is still slightly draining although this also is doing great. Overall very pleased at this time. 04/06/2019 patient appears to be doing a little bit worse in regard to her left  lower extremity upon evaluation today. She feels like this could be becoming infected again which she had issues with previous. Fortunately there is no signs of systemic infection  but again this is always a struggle with her with her legs she will go from doing well to not so well in a very short amount of time. 04/20/2019 on evaluation today patient actually appears to be doing quite well with regard to her right lower extremity I do not see any signs of active infection at this time. Fortunately there is no fever chills noted. She is still taking the antibiotics which I prescribed for her at this point. In regard to the left lower extremity I do feel like some of these areas are better although again she still is having weeping from several locations at this time. 04/27/2019 on evaluation today patient appears to be doing about the same if not slightly worse in regard to her left lower extremity ulcers. She tells me when questioned that she has been sleeping in her Hoveround chair in fact she tells me she falls asleep without even knowing it. I think she is spending a whole lot of time in the chair and less time walking and moving around which is not good for her legs either. On top of that she is in a seated position which is also the worst position she is not really elevating her legs and she is also not using her lymphedema pumps. All this is good to contribute to worsening of her condition in general. 05/18/2019 on evaluation today patient appears to be doing well with regard to her lower extremity on the right in fact this is showing no signs of any open wounds at this time. On the left she is continuing to have issues with areas that do drain. Some of the regions have healed and there are couple areas that have reopened. She did go to the ER per the patient according to recommendations from the home health nurse due to what she was seen when she came out on 05/13/2019. Subsequently she felt like the patient needed to go to the hospital due to the fact that again she was having "milky white discharge" from her leg. Nonetheless she had and then was placed on doxycycline and  subsequently seems to be doing better. 06/01/2019 upon evaluation today patient appears to be doing really in my opinion about the same. I do not see any signs of active infection which is good news. Overall she still has wounds over the bilateral lower extremities she has reopened on the right but this appears to be more of a crack where there is weeping/edema coming from the region. I do not see any evidence of infection at either site based on what I visualized today. 07/13/2019 upon evaluation today patient appears to be doing a little worse compared to last time I saw her. She since has been in the hospital from 06/21/2019 through 06/29/2019. This was secondary to having Covid. During that time they did apply lotion to her legs which unfortunately has caused her to develop a myriad of open wounds on her lower extremities. Her legs do appear to be doing better as far as the overall appearance is concerned but nonetheless she does have more open and weeping areas. 07/27/2019 upon evaluation today patient appears to be doing more poorly to be honest in regard to her left lower extremity in particular. There is no signs of systemic infection although I do believe she may  have local infection. She notes she has been having a lot of blue/green drainage which is consistent potentially with Pseudomonas. That may be something that we need to consider here as well. The doxycycline does not seem to have been helping. 08/03/2019 upon evaluation today patient appears to be doing a little better in my opinion compared to last week's evaluation. Her culture I did review today and she is on appropriate medications to help treat the Enterobacter that was noted. Overall I feel like that is good news. With that being said she is unfortunately continuing to have a lot of drainage and though it is doing better I still think she has a long ways to go to get things dried up in general. Fortunately there is no signs of  systemic infection. 08/10/2019 upon evaluation today patient appears to be doing may be slightly better in regard to her left lower extremity the right lower extremity is doing much better. Fortunately there is no signs of infection right now which is good news. No fevers, chills, nausea, vomiting, or diarrhea. 08/24/2019 on evaluation today patient appears to be doing slightly better in regard to her lower extremities. The left lower extremity seems to be healed the right lower extremity is doing better though not completely healed as far as the openings are concerned. She has some generalized issues here with edema and weeping secondary to her lymphedema though again I do believe this is little bit drier compared to prior weeks evaluations. In general I am very pleased with how things seem to be progressing. No fevers, chills, nausea, vomiting, or diarrhea. 08/31/2019 upon evaluation today patient actually seems to making some progress here with regard to the left lower extremity in particular. She has been tolerating the dressing changes without complication. Fortunately there is no signs of active infection at this time. No fevers, chills, nausea, vomiting, or diarrhea. She did see Dr. Doren Custard and he did note that she did have a issue with the left great saphenous vein and the small saphenous vein in the leg. With that being said he was concerned about the possibility of laser ablation not being extremely successful. He also mentioned a small risk of DVT associated with the procedure. However if the wounds do not continue to improve he stated that that would probably be the way to go. Fortunately the patient's legs do seem to be doing much better. 09/07/2019 upon evaluation today patient appears to be doing better with regard to her lower extremities. She has been tolerating the dressing changes without complication. With that being said she is showing signs of improvement and overall very pleased. There  are some areas on her leg that I think we do need to debride we discussed this last week the patient is in agreement with doing that as long as it does not hurt too badly. 09/14/2019 upon evaluation today patient appears to be doing decently well with regard to her left lower extremity. She is not having near as much weeping as she has had in the past things seem to be drying up which is good news. There is no signs of active infection at this time. 09/21/19 upon evaluation today patient appears to be doing better in regard overall to her bilateral lower extremities. She again has less open than she did previous and each week I feel like this is getting better. Fortunately there is no signs of active infection at this time. No fevers, chills, nausea, vomiting, or diarrhea. 09/28/2019 upon evaluation  today patient actually appears to be showing signs of improvement with regard to her left lower extremity. Unfortunately the right medial lower extremity around the ankle region has reopened to some degree but this appears to be minimal still which is good news. There is no signs of active infection at this time which is also good news. 10/12/2019 upon evaluation today patient appears to be doing okay with regard to her bilateral lower extremities today. The right is a little bit worse then last evaluation 2 weeks ago. The left is actually doing a little better in my opinion. Overall there is no signs of active infection at this time that I see. Obviously that something we have to keep a close eye on she is very prone to this with the significant and multiple openings that she has over the bilateral lower extremities. 10/19/2019 upon evaluation today patient appears to be doing about the best that I have seen her in quite some time. She has been tolerating the dressing changes without complication. There does not appear to be any signs of active infection and overall I am extremely happy with the way her legs  appeared. She is drying up quite nicely and overall is having less pain. 11/09/2019 upon evaluation today patient appears to be doing better in regard to her wounds. She seems to be drying up more and more each time I see her this is just taking a very long time. Fortunately there is no signs of active infection at this time. 11/23/2019 upon evaluation today patient actually appears to be doing excellent in regard to her lower extremities at this point compared to where she has been. Fortunately there is no signs of active infection at this time. She did go to the hospital last week for nausea and vomiting completely unrelated to her wounds. Fortunately she is doing better she was given some Reglan and got better. She had associated abdominal pain but they never found out what was going on. 12/07/2019 upon evaluation today patient appears to be doing well for the most part in regard to her legs. She unfortunately has not been keeping the Coban portion of her wraps on therefore the compression has not really been sufficient for what it is supposed to be. Nonetheless she tells me that it just hurt too bad therefore she removed it. 12/21/2019 upon evaluation today patient actually appears to be doing quite well with regard to her legs. I do feel like she has been making progress which is great news and overall there is no signs of active infection at this time. No fevers, chills, nausea, vomiting, or diarrhea. 01/04/2020 upon evaluation today patient presents for follow-up concerning her lower extremity edema bilaterally. She still has open wounds she has not been using her lymphedema pumps. She is also not been utilizing her compression wraps appropriately she tends to unwrap them, take them off, or states that they hurt. Obviously the reason they hurt is because her legs start to swell but the issue is if she would use her compression/lymphedema pumps regularly she would not swell and she would have the pain.  Nonetheless she has not even picked them up once honestly over the past several months and may be even as much as in the past year based on my opinion and what have seen. She tells me today that after last week when I talked about this with her specifically actually that was 2 weeks ago that she "forgot". 8//21 on evaluation today patient appears  to be doing a little better in regard to her legs bilaterally. Fortunately there is no signs of active infection at this time. She tells me that she used her lymphedema pumps all of one time over the past 2 weeks since I last saw her. She tells me that she has been too busy in order to continue to use these. 02/01/2020 on evaluation today patient appears to be doing some better in regard to her wounds in general in her legs. We felt the right was healed although is not completely it does appear to be doing better she tells me she has been using her lymphedema pumps that she has had this six times since I last saw her. Obviously the more she does that the better she would do my opinion 02/15/2020 upon evaluation today patient appears to be doing about the same in regard to her legs. She tells me that she is pumping I'm still not sure how much she does to be perfectly honest. However even if she does a little bit here and there I guess that is better than nothing. Fortunately there is no sign of active infection at this time which is great news. No fevers, chills, nausea, vomiting, or diarrhea. 02/29/2020 on evaluation today patient actually appears to be doing quite well all things considered this week. She has been tolerating the dressing changes without complication. Fortunately there is no signs of active infection at this time. No fevers, chills, nausea, vomiting, or diarrhea. 03/14/2020 upon evaluation today patient appears to be doing really about the same in regard to her legs. There is no signs of improvement overall and she as noted from home health does  not appear to be elevating her legs he can get into her lift chair. There is too much stuff piled up on it the patient tells me. She also tells me she cannot really use her pumps effectively due to the fact that she cannot have any space to get them on. Finally she is also not really elevating her legs because she is not sleeping in her bed she is sleeping in her chair currently and again overall I think everything that she is done in combination has been exactly the wrong thing for what she needs for her legs. 04/04/2020 upon evaluation today patient appears to be doing well at this time with regard to her legs. She is actually been pumping, keeping her wraps on, and to be honest she seems to be doing dramatically better the right leg is excellent the left leg is also excellent and measuring much smaller than previous. Objective Constitutional Well-nourished and well-hydrated in no acute distress. Vitals Time Taken: 10:31 AM, Height: 62 in, Weight: 335 lbs, BMI: 61.3, Temperature: 97.8 F, Pulse: 97 bpm, Respiratory Rate: 22 breaths/min, Blood Pressure: 136/85 mmHg. Respiratory normal breathing without difficulty. Psychiatric this patient is able to make decisions and demonstrates good insight into disease process. Alert and Oriented x 3. pleasant and cooperative. General Notes: Patient's legs bilaterally both appear to be doing significantly better with regard to the overall healing at this time. There does not appear to be any signs of active infection which is great news and overall very pleased with what we are seeing at this point. I do believe her to keep in her wraps on, elevating her legs, and using her pumps has gone a long way to getting this under control I am very pleased she is doing all this. Integumentary (Hair, Skin) Wound #61 status is  Open. Original cause of wound was Gradually Appeared. The wound is located on the Left,Circumferential Lower Leg. The wound measures 14cm length  x 16.5cm width x 0.1cm depth; 181.427cm^2 area and 18.143cm^3 volume. There is Fat Layer (Subcutaneous Tissue) exposed. There is no tunneling or undermining noted. There is a medium amount of serous drainage noted. The wound margin is flat and intact. There is large (67-100%) pink, pale granulation within the wound bed. There is a small (1-33%) amount of necrotic tissue within the wound bed including Adherent Slough. Wound #64 status is Open. Original cause of wound was Gradually Appeared. The wound is located on the Left,Dorsal Foot. The wound measures 5.2cm length x 7cm width x 0.1cm depth; 28.588cm^2 area and 2.859cm^3 volume. There is Fat Layer (Subcutaneous Tissue) exposed. There is no tunneling or undermining noted. There is a medium amount of serous drainage noted. The wound margin is flat and intact. There is large (67-100%) pink, pale granulation within the wound bed. There is a small (1-33%) amount of necrotic tissue within the wound bed including Adherent Slough. Wound #65 status is Healed - Epithelialized. Original cause of wound was Gradually Appeared. The wound is located on the Right,Medial Lower Leg. The wound measures 0cm length x 0cm width x 0cm depth; 0cm^2 area and 0cm^3 volume. Wound #66 status is Open. Original cause of wound was Gradually Appeared. The wound is located on the Right,Anterior Lower Leg. The wound measures 0.2cm length x 0.2cm width x 0.1cm depth; 0.031cm^2 area and 0.003cm^3 volume. There is Fat Layer (Subcutaneous Tissue) exposed. There is no tunneling or undermining noted. There is a small amount of serosanguineous drainage noted. There is small (1-33%) pink, pale granulation within the wound bed. There is a large (67-100%) amount of necrotic tissue within the wound bed including Adherent Slough. 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 There was a Three Layer Compression Therapy Procedure by Kela Millin, RN. 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 There was a Three Layer Compression Therapy Procedure by Kela Millin, RN. Post procedure Diagnosis Wound #66: Same as Pre-Procedure Plan Follow-up Appointments: Return Appointment in 2 weeks. Dressing Change Frequency: Wound #61 Left,Circumferential Lower Leg: Change dressing three times week. Wound #64 Left,Dorsal Foot: Change dressing three times week. Skin Barriers/Peri-Wound Care: Moisturizing lotion - to dry skin Wound Cleansing: Clean wound with Wound Cleanser - wash legs with soap and water with dressing changes May shower with protection. Primary Wound Dressing: Wound #61 Left,Circumferential Lower Leg: Calcium Alginate with Silver Wound #64 Left,Dorsal Foot: Calcium Alginate with Silver Wound #66 Right,Anterior Lower Leg: Calcium Alginate with Silver Secondary Dressing: Wound #61 Left,Circumferential Lower Leg: Dry Gauze ABD pad - as needed Wound #64 Left,Dorsal Foot: Dry Gauze ABD pad -  as needed Wound #66 Right,Anterior Lower Leg: Dry Gauze ABD pad - as  needed Edema Control: 3 Layer Compression System - Bilateral Avoid standing for long periods of time - walking is encouraged Elevate legs to the level of the heart or above for 30 minutes daily and/or when sitting, a frequency of: - do not sleep in chair with feet dangling, MUST elevate legs in recliner while sitting Exercise regularly Segmental Compressive Device. - lymphedema pumps 60 minutes 1- 2 times per day Off-Loading: Turn and reposition every 2 hours Additional Orders / Instructions: Follow Nutritious Diet - T include vitamin A, vitamin C, and Zinc along with increased protein intake. o Home Health: [facility] for [facility]. - [facility]. I would recommend that we continue with the current wound care measures utilizing the silver alginate dressing to the open wound areas and subsequently I would also recommend that she continue with the compression wraps that do seem to be doing well for her. This is again a bilateral 3 layer compression wrap. 2. She should also continue with her use of the lymphedema pumps now that she is using those they really are making a big improvement here. 3. I do believe the elevation is also okay and she does seem to be doing that as well overall she is much better currently that have seen her ever in the time that have been taking care of her. We will see patient back for reevaluation in 2 weeks here in the clinic. If anything worsens or changes patient will contact our office for additional recommendations. Electronic Signature(s) Signed: 04/04/2020 11:18:07 AM By: [name] Keeler PA-C Entered By: [name] [name] on 04/04/2020 11:18:06 -------------------------------------------------------------------------------- SuperBill Details Patient Name: Date of Service: [name] [name] 04/04/2020 Medical Record Number: [id number] Patient Account Number: [id number] Date of Birth/Sex: Treating RN: December 02, 1950 (69 y.o. [name] [name] Primary Care Provider: Dustin [name] Other Clinician: Referring Provider: Treating Provider/Extender: [name] [name] Weeks in Treatment: 139 Diagnosis Coding ICD-10 Codes Code Description E11.622 Type 2 diabetes mellitus with other skin ulcer I89.0 Lymphedema, not elsewhere classified I87.331 Chronic venous hypertension (idiopathic) with ulcer and inflammation of right lower extremity I87.332 Chronic venous hypertension (idiopathic) with ulcer and inflammation of left lower extremity L97.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 leg with fat layer exposed L97.822 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 I10 Essential (primary) hypertension E66.01 Morbid (severe) obesity due to excess calories F41.8 Other specified anxiety disorders R53.1 Weakness Facility Procedures The patient participates with Medicare or their insurance follows the Medicare Facility Guidelines: CPT4 Description Modifier Quantity Code [id number] BILATERAL: Application of multi-layer venous compression system; leg (below knee), including ankle and 1 foot. Physician Procedures : CPT4 Code Description Modifier [id number] - WC PHYS LEVEL 3 - EST PT ICD-10 Diagnosis Description E11.622 Type 2 diabetes mellitus with other skin ulcer I89.0 Lymphedema, not elsewhere classified I87.331 Chronic venous hypertension (idiopathic) with  ulcer and inflammation of right lower extremity I87.332 Chronic venous hypertension (idiopathic) with ulcer and inflammation of left lower extremity Quantity: 1 Electronic Signature(s) Signed: 04/04/2020 11:18:33 AM By: [name] Keeler PA-C Entered By: [name] [name] on 04/04/2020 11:18:28

## 2020-04-18 ENCOUNTER — Encounter (HOSPITAL_BASED_OUTPATIENT_CLINIC_OR_DEPARTMENT_OTHER): Payer: Medicare PPO | Attending: Physician Assistant | Admitting: Physician Assistant

## 2020-04-18 ENCOUNTER — Other Ambulatory Visit: Payer: Self-pay

## 2020-04-18 DIAGNOSIS — L97822 Non-pressure chronic ulcer of other part of left lower leg with fat layer exposed: Secondary | ICD-10-CM | POA: Insufficient documentation

## 2020-04-18 DIAGNOSIS — E11622 Type 2 diabetes mellitus with other skin ulcer: Secondary | ICD-10-CM | POA: Diagnosis present

## 2020-04-18 DIAGNOSIS — L97522 Non-pressure chronic ulcer of other part of left foot with fat layer exposed: Secondary | ICD-10-CM | POA: Insufficient documentation

## 2020-04-18 DIAGNOSIS — I890 Lymphedema, not elsewhere classified: Secondary | ICD-10-CM | POA: Insufficient documentation

## 2020-04-18 DIAGNOSIS — L97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 leg with fat layer exposed: Secondary | ICD-10-CM | POA: Diagnosis not present

## 2020-04-18 DIAGNOSIS — I87331 Chronic venous hypertension (idiopathic) with ulcer and inflammation of right lower extremity: Secondary | ICD-10-CM | POA: Diagnosis not present

## 2020-04-18 DIAGNOSIS — Z6841 Body Mass Index (BMI) 40.0 and over, adult: Secondary | ICD-10-CM | POA: Insufficient documentation

## 2020-04-18 DIAGNOSIS — I10 Essential (primary) hypertension: Secondary | ICD-10-CM | POA: Insufficient documentation

## 2020-04-18 DIAGNOSIS — R531 Weakness: Secondary | ICD-10-CM | POA: Insufficient documentation

## 2020-04-18 DIAGNOSIS — F418 Other specified anxiety disorders: Secondary | ICD-10-CM | POA: Insufficient documentation

## 2020-04-18 NOTE — Progress Notes (Addendum)
[name], [name] ([id number]) Visit Report for 04/18/2020 Chief Complaint Document Details Patient Name: Date of Service: [name], [name] 04/18/2020 10:15 A M Medical Record Number: [id number] Patient Account Number: [id number] Date of Birth/Sex: Treating RN: 14-Apr-1951 (69 y.o. [name] Primary Care Provider: [name] Other Clinician: Referring Provider: Treating Provider/Extender: Doyle Askew, FRED Weeks in Treatment: [phone] Information Obtained from: Patient Chief Complaint Bilateral reoccurring LE ulcers Electronic Signature(s) Signed: 04/18/2020 10:24:41 AM By: [name] PA-C Entered By: [name] on 04/18/2020 10:24:41 -------------------------------------------------------------------------------- HPI Details Patient Name: Date of Service: [name]. 04/18/2020 10:15 A M Medical Record Number: [id number] Patient Account Number: [id number] Date of Birth/Sex: Treating RN: [birth year] (69 y.o. [name] Primary Care Provider: [name] Other Clinician: Referring Provider: Treating Provider/Extender: Doyle Askew, FRED Weeks in Treatment: 141 History of Present Illness HPI Description: this patient has been seen a couple of times before and returns with recurrent problems to her right and left lower extremity with swelling and weeping ulcerations due to not wearing her compression stockings which she had been advised to do during her last discharge, at the end of June 2018. During her last visit the patient had had normal arterial blood flow and her venous reflux study did not necessitate any surgical intervention. She was recommended compression and elevation and wound care. After prolonged treatment the patient was completely healed but she has been noncompliant with wearing or compressions.. She was here last week with an outpatient return visit planned but the patient came in a very poor general condition with altered  mental status and was rushed to the ER on my request. With a history of hypertension, diabetes, TIA and right-sided weakness she was set up for an MRI on her brain and cervical spine and was sent to [facility]. Getting an MRI done was very difficult but once the workup was done she was found not to have any spinal stenosis, epidural abscess or hematoma or discitis. This was radiculopathy to be treated as an outpatient and she was given a follow-up appointment. Today she is feeling much better alert and oriented and has come to reevaluate her bilateral lower extremity lymphedema and ulceration 03/25/2017 -- she was admitted to the hospital on 03/16/2017 and discharged on 03/18/2017 with left leg cellulitis and ulceration. She was started on vancomycin and Zosyn and x-ray showed no bony involvement. She was treated for a cellulitis with IV antibiotics changed to Rocephin and Flagyl and was discharged on oral Keflex and doxycycline to complete a 7 day course. Last hemoglobin A1c was 7.1 and her other ailments including hypertension got asthma were appropriately treated. 05/06/2017 -- she is awaiting the right size of compression stockings from [place] but other than that has been doing well. ====== Old notes 69 year old patient was seen one time last October and was lost to follow-up. She has recurrent problems with weeping and ulceration of her left lower extremity and has swelling of this for several years. It has been worse for the last 2 months. Past medical history is significant for diabetes mellitus type 2, hypertension, gout, morbid obesity, depressive disorders, hiatal hernia, migraines, status post knee surgery, risk of a cholecystectomy, vaginal hysterectomy and breast biopsy. She is not a smoker. As noted before she has never had a venous duplex study and an arterial ABI study was attempted but the left lower extremity was noncompressible 10/01/2016 -- had a lower extremity venous duplex  reflux evaluation which showed  no evidence of deep vein reflux in the right or left lower extremity, and no evidence of great saphenous vein reflux more than 500 ms in the right or left lower extremity, and the left small saphenous vein is incompetent but no vascular consult was recommended. review of her electronic medical records noted that the ABI was checked in July 2017 where the right ABI was normal limits and the left ABI could not be ascertained due to pain with cuff pressure but the waveforms are within normal limits. her arterial duplex study scheduled for April 27. 10/08/2016 -- the patient has various reasons for not having a compression on and for the last 3 days she has had no compression on her left lower extremity either due to pain or the lack of nursing help. She does not use her juxta lites either. 10/15/2016 -- the patient did not keep her appointment for arterial duplex study on April 27 and I have asked her to reschedule this. Her pain is out of proportion with the physical findings and she continuously fails to wear a compression wraps and cuts them off because she says she cannot tolerate the pain. She does not use her juxta lites either. 10/22/2016 -- he has rescheduled her arterial duplex study to May 21 and her pain today is a bit better. She has not been wearing her juxta lites on her right lower extremity but now understands that she needs to do this. She did tolerate the to press compression wrap on her left lower extremity 10/29/2016 --arterial duplex study is scheduled for next week and overall she has been tolerating her compression wraps and also using her juxta lites on her right lower extremity 11/05/2016 -- the right ABI was 0.95 the left was 1.03. The digit TBI is on the right was 0.83 on the left was 0.92 and she had biphasic flow through these vessels. The impression was that of normal lower extremity arterial study. 11/12/2016 -- her pain is minimal and she  is doing very well overall. 11/26/2016 -- she has got juxta lites and her insurance will not pay for additional dual layer compression stockings. She is going to order some from [place]. 05/12/2017 -- her juxta lites are very old and too big for her and these have not been helping with compression. She did get 20-30 mm compression stockings from [place] but she and her husband are unable to put these on. I believe she will benefit from bilateral Extremit-ease, compression stockings and we will measure her for these today. 05/20/2017 -- lymphedema on the left lower extremity has increased a lot and she has a open ulceration as a result of this. The right lower extremity is looking pretty good. She has decided to by the compression stockings herself and will get reimbursed by the home health, at a later date. 05/27/2017 -- her sciatica is bothering her a lot and she thought her left leg pain was caused due to the compression wrap and hence removed it and has significant lymphedema. There is no inflammation on this left lower extremity. 06/17/17 on evaluation today patient appears to be doing very well and in fact is completely healed in regard to her ulcerations. Unfortunately however she does have continued issues with lymphedema nonetheless. We did order compression garments for her unfortunately she states that the size that she received were large although we ordered medium. Obviously this means she is not getting the optimal compression. She does not have those with her today and therefore  we could not confirm and contact the company on her behalf. Nonetheless she does state that she is going to have her husband bring them by tomorrow so that we can verify and then get in touch with the company. No fevers, chills, nausea, or vomiting noted at this time. Overall patient is doing better otherwise and I'm pleased with the progress she has made. 07/01/17 on evaluation today patient appears to be doing  very well in regard to her bilateral lower extremity she does not have any openings at this point which is excellent news. Overall I'm pleased with how things have progressed up to this time. Since she is doing so well we did order her compression which we are seeing her today to ensure that it fits her properly and everything is doing well in that regard and then subsequently she will be discharged. ============ Old Notes: 03/31/16 patient presents today for evaluation concerning open wounds that she has over the left medial ankle region as well as the left dorsal foot. She has previously had this occur although it has been healed for a number of months after having this for about a year prior until her hospitalization on 01/05/16. At that point in time it appears that she was admitted to the hospital for left lower extremity cellulitis and was placed on vancomycin and Zosyn at that point. Eventually upon discharge on January 15, 2016 she was placed on doxycycline at that point in time. Later on 03/27/16 positive wound culture growing Escherichia coli this was switched to amoxicillin. Currently she tells me that she is having pain radiated to be a 7 out of 10 which can be as high as 10 out of 10 with palpation and manipulation of the wound. This wound appears to be mainly venous in nature due to the bilateral lower extremity venous stasis/lymphedema. This is definitely much worse on her left than the right side. She does have type 1 diabetes mellitus, hypertension, morbid obesity, and is wheelchair dependent.during the course of the hospital stay a blood culture was also obtained and fortunately appeared negative. She also had an x-ray of the tibia/fibula on the left which showed no acute bone abnormality. Her white blood cell count which was performed last on 03/25/16 was 7.3, hemoglobin 12.8, protein 7.1, albumin 3.0. Her urine culture appeared to be negative for any specific organisms. Patient did  have a left lower extremity venous duplex evaluation for DVT . This did not include venous reflux studies but fortunately was negative for DVT Patient also had arterial studies performed which revealed that she had a . normal ABI on the right though this was unable to be performed on the left secondary to pain that she was having around the ankle region due to the wound. However it was stated on report that she had biphasic pulses and apparently good blood flow. ========== 06/03/17 she is here in follow-up evaluation for right lower extremity ulcer. The right lower sure he has healed but she has reopened to the left medial malleolus and dorsal foot with weeping. She is waiting for new compression garments to arrive from home health, the previous compression garments were ill fitting. We will continue with compression bilaterally and follow-up in 2 weeks Readmission: 08/05/17 on evaluation today patient appears to be doing somewhat poorly in regard to her left lower extremity especially although the right lower extremity has a small area which may no longer be open. She has been having a lot of drainage from the  left lower extremity however he tells me that she has not been able to use the EXTREMIT-EASE Compression at this point. She states that she did better and was able to actually apply the Juxta-Lite compression although the wound that she has is too large and therefore really does not compress which is why she cannot wear it at this point. She has no one who can help her put it on regular basis her son can sometimes but he's not able to do it most of the time. I do believe that's why she has begun to weave and have issues as she is currently yet again. No fevers, chills, nausea, or vomiting noted at this time. Patient is no evidence of dementia. 08/12/17 on evaluation today patient appears to still be doing fairly well in regard to the draining areas/weeping areas at this point. With that being  said she unfortunately did go to the ER yesterday due to what was felt to be possibly a cellulitis. They place her on doxycycline by mouth and discharge her home. She definitely was not admitted. With that being said she states she has had more discomfort which has been unusual for her even compared to prior times and she's had infections.08/12/17 on evaluation today patient appears to still be doing fairly well in regard to the draining areas/weeping areas at this point. With that being said she unfortunately did go to the ER yesterday due to what was felt to be possibly a cellulitis. They place her on doxycycline by mouth and discharge her home. She definitely was not admitted. With that being said she states she has had more discomfort which has been unusual for her even compared to prior times and she's had infections. 08/19/17 put evaluation today patient tells me that she's been having a lot of what sounds to be neuropathic type pain in regard to her left lower extremity. She has been using over-the-counter topical bins again which some believe. That in order to apply the she actually remove the wrap we put on her last Wednesday on Thursday. Subsequently she has not had anything on compression wise since that time. The good news is a lot of the weeping areas appear to have closed at this point again I believe she would do better with compression but we are struggling to get her to actually use what she needs to at this point. No fevers, chills, nausea, or vomiting noted at this time. 09/03/17 on evaluation today patient appears to be doing okay in regard to her lower extremities in regard to the lymphedema and weeping. Fortunately she does not seem to show any signs of infection at this point she does have a little bit of weeping occurring in the right medial malleolus area. With that being said this does not appear to be too significant which is good news. 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 She has severe skin damage secondary to both of these features involving the dorsal left foot and medial left ankle and lower leg. Still has open areas in the left anterior foot. The area on the right closed over. She uses her own juxta light stockings. She does not have an arterial issue 09/16/17 on evaluation today patient actually appears to be doing excellent in regard to her bilateral lower extremity swelling. The Juxta-Lite compression wrap seem to be doing very well for her. She has not however been using the portion that goes over her foot.  Her left foot still is draining a little bit not nearly as significant as it has been in the past but still I do believe that she likely needs to utilize the full wrap including the foot portion of this will improve as well. She also has been apparently putting on a significant amount of Vaseline which also think is not helpful for her. I recommended that if she feels she needs something for moisturizer Eucerin will probably be better. 09/30/17 on evaluation today patient presents with several new open areas in regard to her left lower extremity although these appear to be minimal and mainly seem to be more moisture breakdown than anything. Fortunately she does not seem to have any evidence of infection which is great news. She has been tolerating the dressing changes without complication we are using silver alginate on the foot she has been using AB pads to have the legs and using her Juxta- Lite compression which seems to be controlling her swelling very well. Overall I'm pleased with the poor way she has progressed. 10/14/17 on evaluation today patient appears to be doing better in regard to her left lower extremity areas of weeping. She does still have some discomfort although in general this does not appear to be as macerated and I think it is progressing nicely. I do think she still  needs to wear the foot portion of her Juxta- Lite in order to get the most benefit from the wrap obviously. She states she understands. Fortunately there does not appear to be evidence of infection at this time which is great news. 10/28/17 on evaluation today patient appears to be doing excellent in regard to her left lower extremity. She has just a couple areas that are still open and seem to be causing any trouble whatsoever. For that reason I think that she is definitely headed in the right direction the spots are very tiny compared to what we have been dealing with in the past. 11/11/17 on evaluation today patient appears to have a right lateral lower extremity ulcer that has opened since I last saw her. She states this is where the home health nurse that was coming out remove the dressing without wetting the alginate first. Nonetheless I do not know if this is indeed the case or not but more importantly we have not ordered home help to be coming out for her wounds at all. I'm unsure as to why they are coming out and we're gonna have to check on this and get things situated in that regard. With that being said we currently really do not need them to be coming out as the patient has been taking care of her leg herself without complication and no issues. In fact she was doing much better prior to nursing coming out. 11/25/17 on evaluation today patient actually appears to be doing fairly well in regard to her left lower extremity swelling. In fact she has very little area of weeping at this point there's just a small spot on the lateral portion of her right leg that still has me just a little bit more concerned as far as wanting to see this clear up before I discharge her to caring for this at home. Nonetheless overall she has made excellent progress. 12/09/17 on evaluation today patient appears to be doing rather well in regard to her lower extremity edema. She does have some weeping still in the left  lower extremity although the big area we were taking care of two  weeks ago actually has closed and she has another area of weeping on the left lower extremity immediately as well is the top of her foot. She does not currently have lymphedema pumps she has been wearing her compression daily on a regular basis as directed. With that being said I think she may benefit from lymphedema pumps. 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 We had a very difficult time getting and keeping this under control. 12/23/17 on evaluation today patient actually appears to be doing a little bit more poorly in regard to her bilateral lower extremities. She has been tolerating the Juxta-Lite compression wraps. Unfortunately she has two new ulcers on the right lower extremity and left lower Trinity ulceration seems to be larger. Obviously this is not good news. She has been tolerating the dressings without complication. 12/30/17 on evaluation today patient actually appears to be doing much better in regard to her bilateral lower extremity edema. She continues to have some issues with ulcerations and in fact there appears to be one spot on each leg where the wrap may have caused a little bit of a blister which is subsequently opened up at this point is given her pain. Fortunately it does not appear to be any evidence of infection which is good news. No fevers chills noted. 01/13/18 on evaluation today patient appears to be doing rather well in regard to her bilateral lower extremities. The dressings did get kind of stuck as far as the wound beds are concerned but again I think this is mainly due to the fact that she actually seems to be showing signs of healing which is good news. She's not having as much drainage therefore she was having more of the dressing sticking.  Nonetheless overall I feel like her swelling is dramatically down compared to previous. 01/20/18 on evaluation today patient unfortunately though she's doing better in most regards has a large blister on the left anterior lower extremity where she is draining quite significantly. Subsequently this is going to need debridement today in order to see what's underneath and ensure she does not continue to trapping fluid at this location. Nonetheless No fevers, chills, nausea, or vomiting noted at this time. 01/27/18 on evaluation today patient appears to be doing rather well at this point in regard to her right lower extremity there's just a very small area that she still has open at this point. With that being said I do believe that she is tolerating the compression wraps very well in making good progress. Home health is coming out at this point to see her. Her left lower extremity on the lateral portion is actually what still mainly open and causing her some discomfort for the most part 02/10/18 on evaluation today patient actually appears to be doing very well in regard to her right lower extremity were all the ulcers appear to be completely close. In regard to the left lower extremity she does have two areas still open and some leaking from the dorsal surface of her foot but this still seems to be doing much better to me in general. 02/24/18 on evaluation today patient actually appears to be doing much better in regard to her right lower extremity this is still completely healed. Her left lower extremity is also doing much better fortunately she has no evidence of infection. The one  area that is gonna require some debridement is still on the left anterior shin. Fortunately this is not hurting her as badly today. 03/10/18 on evaluation today patient appears to be doing better in some regards although she has a little bit more open area on the dorsal foot and she also has some issues on the medial portion of  the left lower extremity which is actually new and somewhat deep. With that being said there fortunately does not appear to be any significant signs of infection which is good news. No fevers, chills, nausea, or vomiting noted at this time. In general her swelling seems to be doing fairly well which is good news. 03/31/18 on evaluation today patient presents for follow-up concerning her left lower extremity lymphedema. Unfortunately she has been doing a little bit more poorly since I last saw her in regard to the amount of weeping that she is experiencing. She's also having some increased pain in the anterior shin location. Unfortunately I do not feel like the patient is making such good progress at this point a few weeks back she was definitely doing much better. 04/07/18 on evaluation today patient actually appears to be showing some signs of improvement as far as the left lower extremity is concerned. She has been tolerating the dressing changes and it does appear that the Drawtex did better for her. With that being said unfortunately home health is stating that they cannot obtain the Drawtex going forward. Nonetheless we're gonna have to check and see what they may be able to get the alginate they were using was getting stuck in causing new areas of skin being pulled all that with and subsequently weep and calls her to worsen overall this is the first time we've seen improvement at this time. 04/14/18 on evaluation today patient actually appears to be doing rather well at this point there does not appear to be any evidence of infection at this time and she is actually doing excellent in regard to the weeping in fact she almost has no openings remaining even compared to just last week this is a dramatic improvement. No fevers chills noted 04/21/18 evaluation today patient actually appears to be doing very well. She in fact is has a small area on the posterior lower extremity location and she has  a small area on the dorsal surface of her foot that are still open both of which are very close to closing. We're hoping this will be close shortly. She brought her Juxta-Lite wrap with her today hoping that would be able to put her in it unfortunately I don't think were quite at that point yet but we're getting closer. 04/28/18 upon evaluation today patient actually appears to be doing excellent in regard to her left lower extremity ulcer. In fact the region on the posterior lower extremity actually is much smaller than previously noted. Overall I'm very happy with the progress she has made. She again did bring her Juxta-Lite although we're not quite ready for that yet. 05/11/18 upon evaluation today patient actually appears to be doing in general fairly well in regard to her left lower Trinity. The swelling is very well controlled. With that being said she has a new area on the left anterior lower extremity as well as between the first and second toes of her left foot that was not present during the last evaluation. The region of her posterior left lower extremity actually appears to be almost completely healed. T be honest I'm very pleased  with o the way that stands. Nonetheless I do believe that the lotion may be keeping the area to moist as far as her legs are concerned subsequently I'm gonna consider discontinuing that today. 05/26/18 on evaluation today patient appears to be doing rather well in regard to her left lower should be ulcers. In fact everything appears to be close except for a very small area on the left posterior lower extremity. Fortunately there does not appear to be any evidence of infection at this time. Overall very pleased with her progress. 06/02/18 and evaluation today patient actually appears to be doing very well in regard to her lower extremity ulcers. She has one small area that still continues to weep that I think may benefit her being able to justify lotion and user  Juxta-Lite wraps versus continued to wrap her. Nonetheless I think this is something we can definitely look into at this point. 06/23/18 on evaluation today patient unfortunately has openings of her bilateral lower extremities. In general she seems to be doing much worse than when I last saw her just as far as her overall health standpoint is concerned. She states that her discomfort is mainly due to neuropathy she's not having any other issues otherwise. No fevers, chills, nausea, or vomiting noted at this time. 06/30/18 on evaluation today patient actually appears to be doing a little worse in regard to her right lower extremity her left lower extremity of doing fairly well. Fortunately there is no sign of infection at this time. She has been tolerating the dressing changes without complication. Home health did not come out like they were supposed to for the appropriate wrap changes. They stated that they never received the orders from [place] which were fax. Nonetheless we will send a copy of the orders with the patient today as well. 07/07/18 on evaluation today patient appears to be doing much better in regard to lower extremities. She still has several openings bilaterally although since I last saw her her legs did show obvious signs of infection when she later saw her nurse. Subsequently a culture was obtained and she is been placed on Bactrim and Keflex. Fortunately things seem to be looking much better it does appear she likely had an infection. Again last week we'd even discussed it but again there really was not any obvious sign that she had infection therefore we held off on the antibiotics. Nonetheless I'm glad she's doing better today. 07/14/18 on evaluation today patient appears to be doing much better regarding her bilateral lower Trinity's. In fact on the right lower for me there's nothing open at this point there are some dry skin areas at the sites where she had infection. Fortunately there is  no evidence of systemic infection which is excellent news. No fevers chills noted 07/21/18 on evaluation today patient actually appears to be doing much better in regard to her left lower extremity ulcers. She is making good progress and overall I feel like she's improving each time I see her. She's having no pain I do feel like the infection is completely resolved which is excellent news. No fevers, chills, nausea, or vomiting noted at this time. 07/28/18 on evaluation today patient appears to be doing very well in regard to her left lower [employer]. Everything seems to be showing signs of improvement which is excellent news. Overall very pleased with the progress that has been made. Fortunately there's no evidence of active infection at this time also excellent news. 08/04/18 on evaluation  today patient appears to be doing more poorly in regard to her bilateral lower extremities. She has two new areas open up on the right and these were completely closed as of last week. She still has the two spots on the left which in my pinion seem to be doing better. Fortunately there's no evidence of infection again at this point. 08/11/18 on evaluation today patient actually appears to be doing very well in regard to her bilateral lower Trinity wounds that all seem to be doing better and are measures smaller today. Fortunately there's no signs of infection. No fevers, chills, nausea, or vomiting noted at this time. 08/18/18 on evaluation today patient actually appears to be doing about the same inverter bilateral lower extremities. She continues to have areas that blistering open as was drain that fortunately nothing too significant. Overall I feel like Drawtex may have done better for her however compared to the collagen. 08/25/18 on evaluation today patient appears to be doing a little bit more poorly today even compared to last time I saw her. Again I'm not exactly sure why she's making worse progress over  the past several weeks. I'm beginning to wonder if there is some kind of underlying low level infection causing this issue. I did actually take a culture from the left anterior lower extremity but it was a new wound draining quite a bit at this point. Unfortunately she also seems to be having more pain which is what also makes me worried about the possibility of infection. This is despite never erythema noted at this point. 09/01/18 on evaluation today patient actually appears to be doing a little worse even compared to last week in regard to bilateral lower extremities. She did go to the hospital on the 16th was given a dose of IV Zosyn and then discharged with a recommendation to continue with the Bactrim that I previously prescribed for her. Nonetheless she is still having a lot of discomfort she tells me as well at this time. This is definitely unfortunate. No fevers, chills, nausea, or vomiting noted at this time. 09/08/18 on evaluation today patient's bilateral lower extremities actually appear to be shown signs of improvement which is good news. Fortunately there does not appear to be any signs of active infection I think the anabiotic is helping in this regard. Overall I'm very pleased with how she is progressing. 09/15/18 patient was actually seen in ER yesterday due to her legs as well unfortunately. She states she's been having a lot of pain and discomfort as well as a lot of drainage. Upon inspection today the patient does have a lot of swelling and drainage I feel like this is more related to lymphedema and poor fluid control than it is to infection based on what I'm seeing. The physician in the emergency department also doubted that the patient was having a significant infection nonetheless I see no evidence of infection obvious at this point although I do see evidence of poor fluid control. She still not using a compression pumps, she is not elevating due to her lift chair as well as her  hospital bed being broken, and she really is not keeping her legs up as much as they should be and also has been taking off her wraps. All this combined I think has led to poor fluid control and to be honest she may be somewhat volume overloaded in general as well. I recommend that she may need to contact your physician to see if a  prescription for a diuretic would be beneficial in their opinion. As long as this is safe I think it would likely help her. 09/29/18 on evaluation today patient's left lower extremity actually appears to be doing quite a bit better. At least compared to last time that I saw her. She still has a large area where she is draining from but there's a lot of new skin speckled trout and in fact there's more new skin that there are open areas of weeping and drainage at this point. This is good news. With regard to the right lower extremity this is doing much better with the only open area that I really see being a dry spot on the right lateral ankle currently. Fortunately there's no signs of active infection at this time which is good news. No fevers, chills, nausea, or vomiting noted at this time. The patient seems somewhat stressed and overwhelmed during the visit today she was very lethargic as such. She does and she is not taking any pain medications at this point. Apparently according to her husband are also in the process of moving which is probably taking its toll on her as well. 10/06/18 on evaluation today patient appears to be doing rather well in regard to her lower extremities compared to last evaluation. Fortunately there's no signs of active infection. She tells me she did have an appointment with her primary. Nonetheless he was concerned that the wounds were somewhat deep based on pictures but we never actually saw her legs. She states that he had her somewhat worried due to the fact that she was fearing now that she was [place] have to have an amputation. With that being  said based on what I'm seeing check she looks better this week that she has the last two times I've seen her with much less drainage I'm actually pleased in this regard. That doesn't mean that she's out of the water but again I do not think what the point of talking about education at all in regard to her leg. She is very happy to hear this. She is also not having as much pain as she was having last week. 10/13/18 unfortunately on evaluation today patient still continues to have a significant amount of drainage she's not letting home health actually apply the compression dressings at this point. She's trying to use of Juxta-Lite of the top of Kerlex and the second layer of the three layer compression wrap. With that being said she just does not seem to be making as good a progress as I would expect if she was having the compression applied and in place on a regular basis. No fevers, chills, nausea, or vomiting noted at this time. 10/20/18 on evaluation today patient appears to be doing a little better in regard to her bilateral lower extremity ulcers. In fact the right lower extremity seems to be healed she doesn't even have any openings at this point left lower extremity though still somewhat macerated seems to be showing signs of new skin growth at multiple locations throughout. Fortunately there's no evidence of active infection at this time. No fevers, chills, nausea, or vomiting noted at this time. 10/27/18 on evaluation today patient appears to be doing much better in regard to her left lower Trinity ulcer. She's been tolerating the laptop complication and has minimal drainage noted at this point. Fortunately there's no signs of active infection at this time. No fevers, chills, nausea, or vomiting noted at this time. 11/03/18 on evaluation today  patient actually appears to be doing excellent in regard to her left lower extremity. She is having very little drainage at this point there does not appear  to be any significant signs of infection overall very pleased with how things have gone. She is likewise extremely pleased still and seems to be making wonderful progress week to week. I do believe antibiotics were helpful for her. Her primary care provider did place on amateur clean since I last saw her. 11/17/18 on evaluation today patient appears to be doing worse in regard to her bilateral lower extremities at this point. She is been tolerating the dressing changes without complication. With that being said she typically takes the Coban off fairly quickly upon arriving home even after being seen here in the clinic and does not allow home health reapply command as part of the dressing at home. Therefore she said no compression essentially since I last saw her as best I can tell. With that being said I think it shows and how much swelling she has in the open wounds that are noted at this point. Fortunately there's no signs of infection but unfortunately if she doesn't get this under control I think she will end up with infection and more significant issues. 11/24/18 on evaluation today patient actually appears to be doing somewhat better in regard to her bilateral lower extremities. 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 Nonetheless that is doing better currently so she should be able to attempt reinitiating the lymphedema pumps at this time. No fevers, chills, nausea, or vomiting noted at this time. 12/01/18 upon evaluation today patient's left lower extremity appears to be doing quite well unfortunately her right lower extremity is not doing nearly as well. She has been tolerating the dressing changes without complication unfortunately she did not keep a wrap on the right at this time. Nonetheless I believe this has led to increased swelling and weeping in  the world is actually much larger than during the last evaluation with her. 12/08/18 on evaluation today patient appears to be doing about the same at this point in regard to her right lower extremity. There is some more palatable to touch I'm concerned about the possibility of there being some infection although I think the main issue is she's not keeping her compression wrap on which in turn is not allowing this area to heal appropriately. 12/22/18 on evaluation today patient appears to be doing better in regard to left lower extremity unfortunately significantly worse in regard to the right lower extremity. The areas of blistering and necrotic superficial tissue have spread and again this does not really appear to be signs of infection and all she just doesn't seem to be doing nearly as well is what she has been in the past. Overall I feel like the Augmentin did absolutely nothing for her she doesn't seem to have any infection again I really didn't think so last time either is more of a potential preventative measure and hoping that this would make some difference but I think the main issue is she's not wearing her compression. She tells me she cannot wear the [place] been we put on she takes it off pretty much upon getting home. Subsequently she worshiped Juxta-Lite when I questioned her about how often she wears it this is no  more than three hours a day obviously that leaves 21 hours that she has no compression and this is obviously not doing well for her. Overall I'm concerned that if things continue to worsen she is at great risk of both infection as well as losing her leg. 01/05/19 on evaluation today patient appears to be doing well in regard to her left lower extremity which he is allowing [place] to wrap and not so well with regard to her right lower extremity which she is not allowing [place] to really wrap and keep the wrap on. She states that it hurts too badly whenever it's wrapped and she ends up  having to take it off. She's been using the Juxta-Lite she tells me up to six hours a day although I question whether or not that's really been the case to be honest. Previously she told me three hours today nonetheless obviously the legs as long as the wrap is doing great when she is not is doing much more poorly. 01/12/2019 on evaluation today patient actually appears to be doing a little better in my opinion with regard to her right lower extremity ulcer. She has a small open area on the left lower extremity unfortunately but again this I think is part of the normal fluctuation of what she is going to have to expect with regard to her legs especially when she is not using her lymphedema pumps on a regular basis. Subsequently based on what I am seeing today I think that she does seem to be doing slightly better with regard to her right lower extremity she did see her primary care provider on Monday they felt she had an infection and placed her on 2 antibiotics. Both Cipro and clindamycin. Subsequently again she seems possibly to be doing a little bit better in regards to the right lower extremity she also tells me however she has been wearing the compression wrap over the past week since I spoke with her as well that is a Kerlix and Coban wrap on the right. No fevers, chills, nausea, vomiting, or diarrhea. 01/19/2019 on evaluation today patient appears to be doing better with regard to her bilateral lower extremities especially the right. I feel like the compression has been beneficial for her which is great news. She did get a call from her primary care provider on her way here today telling her that she did have methicillin- resistant Staphylococcus aureus and he was calling in a couple new antibiotics for her including a ointment to be applied she tells me 3 times a day. With that being said this sounds like likely to be Bactroban which I think could be applied with each dressing/wrap change but I  would not be able to accommodate her applying this 3 times a day. She is in agreement with the least doing this we will add that to her orders today. 01/26/2019 on evaluation today patient actually appears to be doing much better with regard to her right lower extremity. Her left lower extremity is also doing quite well all things considering. Fortunately there is no evidence of active infection at this time. No fevers, chills, nausea, vomiting, or diarrhea. 02/02/2019 on evaluation today patient appears to be doing much better compared to her last evaluation. Little by little off like her right leg is returning more towards normal. There does not appear to be any signs of active infection and overall she seems to be doing quite well which is great news. I am very pleased  in this regard. No fevers, chills, nausea, vomiting, or diarrhea. 02/09/2019 upon evaluation today patient appears to be doing better with regard to her bilateral lower extremities. She has been tolerating the dressing changes without complication. Fortunately there is no signs of active infection at this time. No fevers, chills, nausea, vomiting, or diarrhea. 02/23/2019 on evaluation today patient actually appears to be doing quite well with regard to her bilateral lower extremities. She has been tolerating the dressing changes without complication. She is even used her pumps one time and states that she really felt like it felt good. With that being said she seems to be in good spirits and her legs appear to be doing excellent. 03/09/2019 on evaluation today patient appears to be doing well with regard to her right lower extremity there are no open wounds at this time she is having some discomfort but I feel like this is more neuropathy than anything. With regard to her left lower extremity she had several areas scattered around that she does have some weeping and drainage from but again overall she does not appear to be having any  significant issues and no evidence of infection at this time which is good news. 03/23/2019 on evaluation today patient appears to be doing well with regard to her right lower extremity which she tells me is still close she is using her juxta light here. Her left lower extremity she mainly just has an area on the foot which is still slightly draining although this also is doing great. Overall very pleased at this time. 04/06/2019 patient appears to be doing a little bit worse in regard to her left lower extremity upon evaluation today. She feels like this could be becoming infected again which she had issues with previous. Fortunately there is no signs of systemic infection but again this is always a struggle with her with her legs she will go from doing well to not so well in a very short amount of time. 04/20/2019 on evaluation today patient actually appears to be doing quite well with regard to her right lower extremity I do not see any signs of active infection at this time. Fortunately there is no fever chills noted. She is still taking the antibiotics which I prescribed for her at this point. In regard to the left lower extremity I do feel like some of these areas are better although again she still is having weeping from several locations at this time. 04/27/2019 on evaluation today patient appears to be doing about the same if not slightly worse in regard to her left lower extremity ulcers. She tells me when questioned that she has been sleeping in her Hoveround chair in fact she tells me she falls asleep without even knowing it. I think she is spending a whole lot of time in the chair and less time walking and moving around which is not good for her legs either. On top of that she is in a seated position which is also the worst position she is not really elevating her legs and she is also not using her lymphedema pumps. All this is good to contribute to worsening of her condition in  general. 05/18/2019 on evaluation today patient appears to be doing well with regard to her lower extremity on the right in fact this is showing no signs of any open wounds at this time. On the left she is continuing to have issues with areas that do drain. Some of the regions have healed and  there are couple areas that have reopened. She did go to the ER per the patient according to recommendations from the home health nurse due to what she was seen when she came out on 05/13/2019. Subsequently she felt like the patient needed to go to the hospital due to the fact that again she was having "milky white discharge" from her leg. Nonetheless she had and then was placed on doxycycline and subsequently seems to be doing better. 06/01/2019 upon evaluation today patient appears to be doing really in my opinion about the same. I do not see any signs of active infection which is good news. Overall she still has wounds over the bilateral lower extremities she has reopened on the right but this appears to be more of a crack where there is weeping/edema coming from the region. I do not see any evidence of infection at either site based on what I visualized today. 07/13/2019 upon evaluation today patient appears to be doing a little worse compared to last time I saw her. She since has been in the hospital from 06/21/2019 through 06/29/2019. This was secondary to having Covid. During that time they did apply lotion to her legs which unfortunately has caused her to develop a myriad of open wounds on her lower extremities. Her legs do appear to be doing better as far as the overall appearance is concerned but nonetheless she does have more open and weeping areas. 07/27/2019 upon evaluation today patient appears to be doing more poorly to be honest in regard to her left lower extremity in particular. There is no signs of systemic infection although I do believe she may have local infection. She notes she has been having  a lot of blue/green drainage which is consistent potentially with Pseudomonas. That may be something that we need to consider here as well. The doxycycline does not seem to have been helping. 08/03/2019 upon evaluation today patient appears to be doing a little better in my opinion compared to last week's evaluation. Her culture I did review today and she is on appropriate medications to help treat the Enterobacter that was noted. Overall I feel like that is good news. With that being said she is unfortunately continuing to have a lot of drainage and though it is doing better I still think she has a long ways to go to get things dried up in general. Fortunately there is no signs of systemic infection. 08/10/2019 upon evaluation today patient appears to be doing may be slightly better in regard to her left lower extremity the right lower extremity is doing much better. Fortunately there is no signs of infection right now which is good news. No fevers, chills, nausea, vomiting, or diarrhea. 08/24/2019 on evaluation today patient appears to be doing slightly better in regard to her lower extremities. The left lower extremity seems to be healed the right lower extremity is doing better though not completely healed as far as the openings are concerned. She has some generalized issues here with edema and weeping secondary to her lymphedema though again I do believe this is little bit drier compared to prior weeks evaluations. In general I am very pleased with how things seem to be progressing. No fevers, chills, nausea, vomiting, or diarrhea. 08/31/2019 upon evaluation today patient actually seems to making some progress here with regard to the left lower extremity in particular. She has been tolerating the dressing changes without complication. Fortunately there is no signs of active infection at this time. No fevers,  chills, nausea, vomiting, or diarrhea. She did see Dr. Doren Custard and he did note that she did have  a issue with the left great saphenous vein and the small saphenous vein in the leg. With that being said he was concerned about the possibility of laser ablation not being extremely successful. He also mentioned a small risk of DVT associated with the procedure. However if the wounds do not continue to improve he stated that that would probably be the way to go. Fortunately the patient's legs do seem to be doing much better. 09/07/2019 upon evaluation today patient appears to be doing better with regard to her lower extremities. She has been tolerating the dressing changes without complication. With that being said she is showing signs of improvement and overall very pleased. There are some areas on her leg that I think we do need to debride we discussed this last week the patient is in agreement with doing that as long as it does not hurt too badly. 09/14/2019 upon evaluation today patient appears to be doing decently well with regard to her left lower extremity. She is not having near as much weeping as she has had in the past things seem to be drying up which is good news. There is no signs of active infection at this time. 09/21/19 upon evaluation today patient appears to be doing better in regard overall to her bilateral lower extremities. She again has less open than she did previous and each week I feel like this is getting better. Fortunately there is no signs of active infection at this time. No fevers, chills, nausea, vomiting, or diarrhea. 09/28/2019 upon evaluation today patient actually appears to be showing signs of improvement with regard to her left lower extremity. Unfortunately the right medial lower extremity around the ankle region has reopened to some degree but this appears to be minimal still which is good news. There is no signs of active infection at this time which is also good news. 10/12/2019 upon evaluation today patient appears to be doing okay with regard to her bilateral  lower extremities today. The right is a little bit worse then last evaluation 2 weeks ago. The left is actually doing a little better in my opinion. Overall there is no signs of active infection at this time that I see. Obviously that something we have to keep a close eye on she is very prone to this with the significant and multiple openings that she has over the bilateral lower extremities. 10/19/2019 upon evaluation today patient appears to be doing about the best that I have seen her in quite some time. She has been tolerating the dressing changes without complication. There does not appear to be any signs of active infection and overall I am extremely happy with the way her legs appeared. She is drying up quite nicely and overall is having less pain. 11/09/2019 upon evaluation today patient appears to be doing better in regard to her wounds. She seems to be drying up more and more each time I see her this is just taking a very long time. Fortunately there is no signs of active infection at this time. 11/23/2019 upon evaluation today patient actually appears to be doing excellent in regard to her lower extremities at this point compared to where she has been. Fortunately there is no signs of active infection at this time. She did go to the hospital last week for nausea and vomiting completely unrelated to her wounds. Fortunately she is doing better  she was given some Reglan and got better. She had associated abdominal pain but they never found out what was going on. 12/07/2019 upon evaluation today patient appears to be doing well for the most part in regard to her legs. She unfortunately has not been keeping the Cob[place] portion of her wraps on therefore the compression has not really been sufficient for what it is supposed to be. Nonetheless she tells me that it just hurt too bad therefore she removed it. 12/21/2019 upon evaluation today patient actually appears to be doing quite well with regard to her  legs. I do feel like she has been making progress which is great news and overall there is no signs of active infection at this time. No fevers, chills, nausea, vomiting, or diarrhea. 01/04/2020 upon evaluation today patient presents for follow-up concerning her lower extremity edema bilaterally. She still has open wounds she has not been using her lymphedema pumps. She is also not been utilizing her compression wraps appropriately she tends to unwrap them, take them off, or states that they hurt. Obviously the reason they hurt is because her legs start to swell but the issue is if she would use her compression/lymphedema pumps regularly she would not swell and she would have the pain. Nonetheless she has not even picked them up once honestly over the past several months and may be even as much as in the past year based on my opinion and what have seen. She tells me today that after last week when I talked about this with her specifically actually that was 2 weeks ago that she "forgot". 8//21 on evaluation today patient appears to be doing a little better in regard to her legs bilaterally. Fortunately there is no signs of active infection at this time. She tells me that she used her lymphedema pumps all of one time over the past 2 weeks since I last saw her. She tells me that she has been too busy in order to continue to use these. 02/01/2020 on evaluation today patient appears to be doing some better in regard to her wounds in general in her legs. We felt the right was healed although is not completely it does appear to be doing better she tells me she has been using her lymphedema pumps that she has had this six times since I last saw her. Obviously the more she does that the better she would do my opinion 02/15/2020 upon evaluation today patient appears to be doing about the same in regard to her legs. She tells me that she is pumping I'm still not sure how much she does to be perfectly honest. However  even if she does a little bit here and there I guess that is better than nothing. Fortunately there is no sign of active infection at this time which is great news. No fevers, chills, nausea, vomiting, or diarrhea. 02/29/2020 on evaluation today patient actually appears to be doing quite well all things considered this week. She has been tolerating the dressing changes without complication. Fortunately there is no signs of active infection at this time. No fevers, chills, nausea, vomiting, or diarrhea. 03/14/2020 upon evaluation today patient appears to be doing really about the same in regard to her legs. There is no signs of improvement overall and she as noted from home health does not appear to be elevating her legs he can get into her lift chair. There is too much stuff piled up on it the patient tells me. She also  tells me she cannot really use her pumps effectively due to the fact that she cannot have any space to get them on. Finally she is also not really elevating her legs because she is not sleeping in her bed she is sleeping in her chair currently and again overall I think everything that she is done in combination has been exactly the wrong thing for what she needs for her legs. 04/04/2020 upon evaluation today patient appears to be doing well at this time with regard to her legs. She is actually been pumping, keeping her wraps on, and to be honest she seems to be doing dramatically better the right leg is excellent the left leg is also excellent and measuring much smaller than previous. 04/18/2020 upon evaluation today patient actually is continue to make good progress in regard to her lower extremities bilaterally. Everything is improving and less wet that has been in the past overall I am extremely pleased with where things stand and I think that she is making great progress. The patient tells me she still continue to use her compression pumps Electronic Signature(s) Signed: 04/18/2020  1:16:44 PM By: [name] PA-C Entered By: [name] on 04/18/2020 13:16:44 -------------------------------------------------------------------------------- Physical Exam Details Patient Name: Date of Service: [place]YSIE, [place] 04/18/2020 10:15 A M Medical Record Number: [id number] Patient Account Number: [id number] Date of Birth/Sex: Treating RN: 1950/07/24 (70 y.o. [name] Primary Care Provider: [name] Other Clinician: Referring Provider: Treating Provider/Extender: Doyle Askew, FRED Weeks in Treatment: 141 Constitutional Obese and well-hydrated in no acute distress. Respiratory normal breathing without difficulty. Psychiatric this patient is able to make decisions and demonstrates good insight into disease process. Alert and Oriented x 3. pleasant and cooperative. Notes Upon inspection patient's wound bed actually showed signs of good granulation at this time and excellent epithelization she does have a lot of dry skin but again as I explained to her today she has pretty much to states she is a very extremely macerated and wet or else her skin is very dry. Right now were on the better side of those 2 options in my opinion. Electronic Signature(s) Signed: 04/18/2020 1:17:23 PM By: [name] PA-C Entered By: [name] on 04/18/2020 13:17:23 -------------------------------------------------------------------------------- Physician Orders Details Patient Name: Date of Service: [name]. 04/18/2020 10:15 A M Medical Record Number: [id number] Patient Account Number: [id number] Date of Birth/Sex: Treating RN: 04-21-1951 (69 y.o. [name] Primary Care Provider: [name] Other Clinician: Referring Provider: Treating Provider/Extender: Doyle Askew, FRED Weeks in Treatment: [phone] Verbal / Phone Orders: No Diagnosis Coding ICD-10 Coding Code Description E11.622 Type 2 diabetes mellitus with other skin  ulcer I89.0 Lymphedema, not elsewhere classified I87.331 Chronic venous hypertension (idiopathic) with ulcer and inflammation of right lower extremity I87.332 Chronic venous hypertension (idiopathic) with ulcer and inflammation of left lower extremity L97.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 leg with fat layer exposed L97.822 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 Dressing Change Frequency Wound #61 Left,Circumferential Lower Leg Other: - 2 times per week Wound #64 Left,Dorsal Foot Other: - 2 times per week Skin Barriers/Peri-Wound Care Moisturizing lotion - to dry skin Wound Cleansing Clean  wound with Wound Cleanser - wash legs with soap and water with dressing changes May shower with protection. Primary Wound Dressing Wound #61 Left,Circumferential Lower Leg Calcium Alginate with Silver Wound #64 Left,Dorsal Foot Calcium Alginate with Silver Wound #66 Right,Anterior Lower Leg Calcium Alginate with Silver Secondary Dressing Wound #61 Left,Circumferential Lower Leg Dry Gauze ABD pad - as needed Wound #64 Left,Dorsal Foot Dry Gauze ABD pad - as needed Wound #66 Right,Anterior Lower Leg Dry Gauze ABD pad - as needed Edema Control 3 Layer Compression System - Bilateral void standing for long periods of time - walking is encouraged A Elevate legs to the level of the heart or above for 30 minutes daily and/or when sitting, a frequency of: - do not sleep in chair with feet dangling, MUST elevate legs in recliner while sitting Exercise regularly Segmental Compressive Device. - lymphedema pumps 60 minutes 1- 2 times per  day Off-Loading Turn and reposition every 2 hours Additional Orders / Instructions Follow Nutritious Diet - T include vitamin A, vitamin C, and Zinc along with increased protein intake. o [facility] skilled nursing for wound care. - [facility] Electronic Signature(s) Signed: 04/18/2020 5:20:58 PM By: [name] PA-C Signed: 04/18/2020 5:51:45 PM By: [name] RN, BSN Entered By: [name] on 04/18/2020 11:39:40 -------------------------------------------------------------------------------- Problem List Details Patient Name: Date of Service: [name]. 04/18/2020 10:15 A M Medical Record Number: [id number] Patient Account Number: [id number] Date of Birth/Sex: Treating RN: [birth year] (69 y.o. [name] Primary Care Provider: [name] Other Clinician: Referring Provider: Treating Provider/Extender: Doyle Askew, FRED Weeks in Treatment: [phone] Active Problems ICD-10 Encounter Code Description Active Date MDM Diagnosis E11.622 Type 2 diabetes mellitus with other skin ulcer 08/05/2017 No Yes I89.0 Lymphedema, not elsewhere classified 08/05/2017 No Yes I87.331 Chronic venous hypertension (idiopathic) with ulcer and inflammation of right 08/05/2017 No Yes lower extremity I87.332 Chronic venous hypertension (idiopathic) with ulcer and inflammation of left 08/05/2017 No Yes lower extremity L97.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 leg with fat layer 08/05/2017 No Yes exposed L97.822 Non-pressure chronic ulcer of other part of left lower leg with fat layer exposed2/20/2019 No Yes L97.522 Non-pressure chronic ulcer of other part of left foot with fat layer exposed 06/01/2019 No Yes I10 Essential (primary) hypertension 08/05/2017 No Yes E66.01 Morbid (severe) obesity due to excess calories 08/05/2017 No Yes F41.8 Other specified anxiety disorders 08/05/2017 No Yes R53.1 Weakness 08/05/2017 No Yes Inactive  Problems Resolved Problems Electronic Signature(s) Signed: 04/18/2020 10:24:30 AM By: [name] PA-C Entered By: [name] on 04/18/2020 10:24:28 -------------------------------------------------------------------------------- Progress Note Details Patient Name: Date of Service: [name]. 04/18/2020 10:15 A M Medical Record Number: [id number] Patient Account Number: [id number] Date of Birth/Sex: Treating RN: [birth year] (69 y.o. [name] Primary Care Provider: [name] Other Clinician: Referring Provider: Treating Provider/Extender: Doyle Askew, FRED Weeks in Treatment: [phone] Subjective Chief Complaint Information obtained from Patient Bilateral reoccurring LE ulcers History of Present Illness (HPI) this patient has been seen a couple of times before and returns with recurrent problems to her right and left lower extremity with swelling and weeping ulcerations due to not wearing her compression stockings which she had been advised to do during her last discharge, at the end of June 2018. During her last visit the patient had had normal arterial blood flow and her venous reflux study did not necessitate any surgical intervention. She was recommended compression and elevation and  wound care. After prolonged treatment the patient was completely healed but she has been noncompliant with wearing or compressions.. She was here last week with an outpatient return visit planned but the patient came in a very poor general condition with altered mental status and was rushed to the ER on my request. With a history of hypertension, diabetes, TIA and right-sided weakness she was set up for an MRI on her brain and cervical spine and was sent to [facility]. Getting an MRI done was very difficult but once the workup was done she was found not to have any spinal stenosis, epidural abscess or hematoma or discitis. This was radiculopathy to be treated as an outpatient  and she was given a follow-up appointment. Today she is feeling much better alert and oriented and has come to reevaluate her bilateral lower extremity lymphedema and ulceration 03/25/2017 -- she was admitted to the hospital on 03/16/2017 and discharged on 03/18/2017 with left leg cellulitis and ulceration. She was started on vancomycin and Zosyn and x-ray showed no bony involvement. She was treated for a cellulitis with IV antibiotics changed to Rocephin and Flagyl and was discharged on oral Keflex and doxycycline to complete a 7 day course. Last hemoglobin A1c was 7.1 and her other ailments including hypertension got asthma were appropriately treated. 05/06/2017 -- she is awaiting the right size of compression stockings from [facility] but other than that has been doing well. ====== Old notes 69 year old patient was seen one time last October and was lost to follow-up. She has recurrent problems with weeping and ulceration of her left lower extremity and has swelling of this for several years. It has been worse for the last 2 months. Past medical history is significant for diabetes mellitus type 2, hypertension, gout, morbid obesity, depressive disorders, hiatal hernia, migraines, status post knee surgery, risk of a cholecystectomy, vaginal hysterectomy and breast biopsy. She is not a smoker. As noted before she has never had a venous duplex study and an arterial ABI study was attempted but the left lower extremity was noncompressible 10/01/2016 -- had a lower extremity venous duplex reflux evaluation which showed no evidence of deep vein reflux in the right or left lower extremity, and no evidence of great saphenous vein reflux more than 500 ms in the right or left lower extremity, and the left small saphenous vein is incompetent but no vascular consult was recommended. review of her electronic medical records noted that the ABI was checked in July 2017 where the right ABI was normal limits and the  left ABI could not be ascertained due to pain with cuff pressure but the waveforms are within normal limits. her arterial duplex study scheduled for April 27. 10/08/2016 -- the patient has various reasons for not having a compression on and for the last 3 days she has had no compression on her left lower extremity either due to pain or the lack of nursing help. She does not use her juxta lites either. 10/15/2016 -- the patient did not keep her appointment for arterial duplex study on April 27 and I have asked her to reschedule this. Her pain is out of proportion with the physical findings and she continuously fails to wear a compression wraps and cuts them off because she says she cannot tolerate the pain. She does not use her juxta lites either. 10/22/2016 -- he has rescheduled her arterial duplex study to May 21 and her pain today is a bit better. She has not been wearing her  juxta lites on her right lower extremity but now understands that she needs to do this. She did tolerate the to press compression wrap on her left lower extremity 10/29/2016 --arterial duplex study is scheduled for next week and overall she has been tolerating her compression wraps and also using her juxta lites on her right lower extremity 11/05/2016 -- the right ABI was 0.95 the left was 1.03. The digit TBI is on the right was 0.83 on the left was 0.92 and she had biphasic flow through these vessels. The impression was that of normal lower extremity arterial study. 11/12/2016 -- her pain is minimal and she is doing very well overall. 11/26/2016 -- she has got juxta lites and her insurance will not pay for additional dual layer compression stockings. She is going to order some from [facility]. 05/12/2017 -- her juxta lites are very old and too big for her and these have not been helping with compression. She did get 20-30 mm compression stockings from [employer] but she and her husband are unable to put these on. I believe she  will benefit from bilateral Extremit-ease, compression stockings and we will measure her for these today. 05/20/2017 -- lymphedema on the left lower extremity has increased a lot and she has a open ulceration as a result of this. The right lower extremity is looking pretty good. She has decided to by the compression stockings herself and will get reimbursed by the home health, at a later date. 05/27/2017 -- her sciatica is bothering her a lot and she thought her left leg pain was caused due to the compression wrap and hence removed it and has significant lymphedema. There is no inflammation on this left lower extremity. 06/17/17 on evaluation today patient appears to be doing very well and in fact is completely healed in regard to her ulcerations. Unfortunately however she does have continued issues with lymphedema nonetheless. We did order compression garments for her unfortunately she states that the size that she received were large although we ordered medium. Obviously this means she is not getting the optimal compression. She does not have those with her today and therefore we could not confirm and contact the company on her behalf. Nonetheless she does state that she is going to have her husband bring them by tomorrow so that we can verify and then get in touch with the company. No fevers, chills, nausea, or vomiting noted at this time. Overall patient is doing better otherwise and I'm pleased with the progress she has made. 07/01/17 on evaluation today patient appears to be doing very well in regard to her bilateral lower extremity she does not have any openings at this point which is excellent news. Overall I'm pleased with how things have progressed up to this time. Since she is doing so well we did order her compression which we are seeing her today to ensure that it fits her properly and everything is doing well in that regard and then subsequently she will be discharged. ============ Old  Notes: 03/31/16 patient presents today for evaluation concerning open wounds that she has over the left medial ankle region as well as the left dorsal foot. She has previously had this occur although it has been healed for a number of months after having this for about a year prior until her hospitalization on 01/05/16. At that point in time it appears that she was admitted to the hospital for left lower extremity cellulitis and was placed on vancomycin and Zosyn at that  point. Eventually upon discharge on January 15, 2016 she was placed on doxycycline at that point in time. Later on 03/27/16 positive wound culture growing Escherichia coli this was switched to amoxicillin. Currently she tells me that she is having pain radiated to be a 7 out of 10 which can be as high as 10 out of 10 with palpation and manipulation of the wound. This wound appears to be mainly venous in nature due to the bilateral lower extremity venous stasis/lymphedema. This is definitely much worse on her left than the right side. She does have type 1 diabetes mellitus, hypertension, morbid obesity, and is wheelchair dependent.during the course of the hospital stay a blood culture was also obtained and fortunately appeared negative. She also had an x-ray of the tibia/fibula on the left which showed no acute bone abnormality. Her white blood cell count which was performed last on 03/25/16 was 7.3, hemoglobin 12.8, protein 7.1, albumin 3.0. Her urine culture appeared to be negative for any specific organisms. Patient did have a left lower extremity venous duplex evaluation for DVT . This did not include venous reflux studies but fortunately was negative for DVT Patient also had arterial studies performed which revealed that she had a . normal ABI on the right though this was unable to be performed on the left secondary to pain that she was having around the ankle region due to the wound. However it was stated on report that she had  biphasic pulses and apparently good blood flow. ========== 06/03/17 she is here in follow-up evaluation for right lower extremity ulcer. The right lower sure he has healed but she has reopened to the left medial malleolus and dorsal foot with weeping. She is waiting for new compression garments to arrive from home health, the previous compression garments were ill fitting. We will continue with compression bilaterally and follow-up in 2 weeks Readmission: 08/05/17 on evaluation today patient appears to be doing somewhat poorly in regard to her left lower extremity especially although the right lower extremity has a small area which may no longer be open. She has been having a lot of drainage from the left lower extremity however he tells me that she has not been able to use the EXTREMIT-EASE Compression at this point. She states that she did better and was able to actually apply the Juxta-Lite compression although the wound that she has is too large and therefore really does not compress which is why she cannot wear it at this point. She has no one who can help her put it on regular basis her son can sometimes but he's not able to do it most of the time. I do believe that's why she has begun to weave and have issues as she is currently yet again. No fevers, chills, nausea, or vomiting noted at this time. Patient is no evidence of dementia. 08/12/17 on evaluation today patient appears to still be doing fairly well in regard to the draining areas/weeping areas at this point. With that being said she unfortunately did go to the ER yesterday due to what was felt to be possibly a cellulitis. They place her on doxycycline by mouth and discharge her home. She definitely was not admitted. With that being said she states she has had more discomfort which has been unusual for her even compared to prior times and she's had infections.08/12/17 on evaluation today patient appears to still be doing fairly well in  regard to the draining areas/weeping areas at this point. With  that being said she unfortunately did go to the ER yesterday due to what was felt to be possibly a cellulitis. They place her on doxycycline by mouth and discharge her home. She definitely was not admitted. With that being said she states she has had more discomfort which has been unusual for her even compared to prior times and she's had infections. 08/19/17 put evaluation today patient tells me that she's been having a lot of what sounds to be neuropathic type pain in regard to her left lower extremity. She has been using over-the-counter topical bins again which some believe. That in order to apply the she actually remove the wrap we put on her last Wednesday on Thursday. Subsequently she has not had anything on compression wise since that time. The good news is a lot of the weeping areas appear to have closed at this point again I believe she would do better with compression but we are struggling to get her to actually use what she needs to at this point. No fevers, chills, nausea, or vomiting noted at this time. 09/03/17 on evaluation today patient appears to be doing okay in regard to her lower extremities in regard to the lymphedema and weeping. Fortunately she does not seem to show any signs of infection at this point she does have a little bit of weeping occurring in the right medial malleolus area. With that being said this does not appear to be too significant which is good news. 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 She has severe skin damage secondary to both of these features involving the dorsal left foot and medial left ankle and lower leg. Still has open areas in the left anterior foot. The area on the right closed over. She uses her own juxta light stockings. She does not have an arterial issue 09/16/17 on evaluation today patient actually appears to be  doing excellent in regard to her bilateral lower extremity swelling. The Juxta-Lite compression wrap seem to be doing very well for her. She has not however been using the portion that goes over her foot. Her left foot still is draining a little bit not nearly as significant as it has been in the past but still I do believe that she likely needs to utilize the full wrap including the foot portion of this will improve as well. She also has been apparently putting on a significant amount of Vaseline which also think is not helpful for her. I recommended that if she feels she needs something for moisturizer Eucerin will probably be better. 09/30/17 on evaluation today patient presents with several new open areas in regard to her left lower extremity although these appear to be minimal and mainly seem to be more moisture breakdown than anything. Fortunately she does not seem to have any evidence of infection which is great news. She has been tolerating the dressing changes without complication we are using silver alginate on the foot she has been using AB pads to have the legs and using her Juxta- Lite compression which seems to be controlling her swelling very well. Overall I'm pleased with the poor way she has progressed. 10/14/17 on evaluation today patient appears to be doing better in regard to her left lower extremity areas of weeping. She does still have some discomfort although in general this does not appear to be as macerated and I think it is progressing nicely. I do think she still needs to wear  the foot portion of her Juxta- Lite in order to get the most benefit from the wrap obviously. She states she understands. Fortunately there does not appear to be evidence of infection at this time which is great news. 10/28/17 on evaluation today patient appears to be doing excellent in regard to her left lower extremity. She has just a couple areas that are still open and seem to be causing any trouble  whatsoever. For that reason I think that she is definitely headed in the right direction the spots are very tiny compared to what we have been dealing with in the past. 11/11/17 on evaluation today patient appears to have a right lateral lower extremity ulcer that has opened since I last saw her. She states this is where the home health nurse that was coming out remove the dressing without wetting the alginate first. Nonetheless I do not know if this is indeed the case or not but more importantly we have not ordered home help to be coming out for her wounds at all. I'm unsure as to why they are coming out and we're gonna have to check on this and get things situated in that regard. With that being said we currently really do not need them to be coming out as the patient has been taking care of her leg herself without complication and no issues. In fact she was doing much better prior to nursing coming out. 11/25/17 on evaluation today patient actually appears to be doing fairly well in regard to her left lower extremity swelling. In fact she has very little area of weeping at this point there's just a small spot on the lateral portion of her right leg that still has me just a little bit more concerned as far as wanting to see this clear up before I discharge her to caring for this at home. Nonetheless overall she has made excellent progress. 12/09/17 on evaluation today patient appears to be doing rather well in regard to her lower extremity edema. She does have some weeping still in the left lower extremity although the big area we were taking care of two weeks ago actually has closed and she has another area of weeping on the left lower extremity immediately as well is the top of her foot. She does not currently have lymphedema pumps she has been wearing her compression daily on a regular basis as directed. With that being said I think she may benefit from lymphedema pumps. She has been wearing the  compression on a regular basis since I've been seeing her back in February 2019 through now and despite this she still continues to have issues with stage III lymphedema. We had a very difficult time getting and keeping this under control. 12/23/17 on evaluation today patient actually appears to be doing a little bit more poorly in regard to her bilateral lower extremities. She has been tolerating the Juxta-Lite compression wraps. Unfortunately she has two new ulcers on the right lower extremity and left lower Trinity ulceration seems to be larger. Obviously this is not good news. She has been tolerating the dressings without complication. 12/30/17 on evaluation today patient actually appears to be doing much better in regard to her bilateral lower extremity edema. She continues to have some issues with ulcerations and in fact there appears to be one spot on each leg where the wrap may have caused a little bit of a blister which is subsequently opened up at this point is given her pain. Fortunately  it does not appear to be any evidence of infection which is good news. No fevers chills noted. 01/13/18 on evaluation today patient appears to be doing rather well in regard to her bilateral lower extremities. The dressings did get kind of stuck as far as the wound beds are concerned but again I think this is mainly due to the fact that she actually seems to be showing signs of healing which is good news. She's not having as much drainage therefore she was having more of the dressing sticking. Nonetheless overall I feel like her swelling is dramatically down compared to previous. 01/20/18 on evaluation today patient unfortunately though she's doing better in most regards has a large blister on the left anterior lower extremity where she is draining quite significantly. Subsequently this is going to need debridement today in order to see what's underneath and ensure she does not continue to trapping fluid at this  location. Nonetheless No fevers, chills, nausea, or vomiting noted at this time. 01/27/18 on evaluation today patient appears to be doing rather well at this point in regard to her right lower extremity there's just a very small area that she still has open at this point. With that being said I do believe that she is tolerating the compression wraps very well in making good progress. Home health is coming out at this point to see her. Her left lower extremity on the lateral portion is actually what still mainly open and causing her some discomfort for the most part 02/10/18 on evaluation today patient actually appears to be doing very well in regard to her right lower extremity were all the ulcers appear to be completely close. In regard to the left lower extremity she does have two areas still open and some leaking from the dorsal surface of her foot but this still seems to be doing much better to me in general. 02/24/18 on evaluation today patient actually appears to be doing much better in regard to her right lower extremity this is still completely healed. Her left lower extremity is also doing much better fortunately she has no evidence of infection. The one area that is gonna require some debridement is still on the left anterior shin. Fortunately this is not hurting her as badly today. 03/10/18 on evaluation today patient appears to be doing better in some regards although she has a little bit more open area on the dorsal foot and she also has some issues on the medial portion of the left lower extremity which is actually new and somewhat deep. With that being said there fortunately does not appear to be any significant signs of infection which is good news. No fevers, chills, nausea, or vomiting noted at this time. In general her swelling seems to be doing fairly well which is good news. 03/31/18 on evaluation today patient presents for follow-up concerning her left lower extremity lymphedema.  Unfortunately she has been doing a little bit more poorly since I last saw her in regard to the amount of weeping that she is experiencing. She's also having some increased pain in the anterior shin location. Unfortunately I do not feel like the patient is making such good progress at this point a few weeks back she was definitely doing much better. 04/07/18 on evaluation today patient actually appears to be showing some signs of improvement as far as the left lower extremity is concerned. She has been tolerating the dressing changes and it does appear that the Drawtex did better for  her. With that being said unfortunately home health is stating that they cannot obtain the Drawtex going forward. Nonetheless we're gonna have to check and see what they may be able to get the alginate they were using was getting stuck in causing new areas of skin being pulled all that with and subsequently weep and calls her to worsen overall this is the first time we've seen improvement at this time. 04/14/18 on evaluation today patient actually appears to be doing rather well at this point there does not appear to be any evidence of infection at this time and she is actually doing excellent in regard to the weeping in fact she almost has no openings remaining even compared to just last week this is a dramatic improvement. No fevers chills noted 04/21/18 evaluation today patient actually appears to be doing very well. She in fact is has a small area on the posterior lower extremity location and she has a small area on the dorsal surface of her foot that are still open both of which are very close to closing. We're hoping this will be close shortly. She brought her Juxta-Lite wrap with her today hoping that would be able to put her in it unfortunately I don't think were quite at that point yet but we're getting closer. 04/28/18 upon evaluation today patient actually appears to be doing excellent in regard to her left lower  extremity ulcer. In fact the region on the posterior lower extremity actually is much smaller than previously noted. Overall I'm very happy with the progress she has made. She again did bring her Juxta-Lite although we're not quite ready for that yet. 05/11/18 upon evaluation today patient actually appears to be doing in general fairly well in regard to her left lower Trinity. The swelling is very well controlled. With that being said she has a new area on the left anterior lower extremity as well as between the first and second toes of her left foot that was not present during the last evaluation. The region of her posterior left lower extremity actually appears to be almost completely healed. T be honest I'm very pleased with o the way that stands. Nonetheless I do believe that the lotion may be keeping the area to moist as far as her legs are concerned subsequently I'm gonna consider discontinuing that today. 05/26/18 on evaluation today patient appears to be doing rather well in regard to her left lower should be ulcers. In fact everything appears to be close except for a very small area on the left posterior lower extremity. Fortunately there does not appear to be any evidence of infection at this time. Overall very pleased with her progress. 06/02/18 and evaluation today patient actually appears to be doing very well in regard to her lower extremity ulcers. She has one small area that still continues to weep that I think may benefit her being able to justify lotion and user Juxta-Lite wraps versus continued to wrap her. Nonetheless I think this is something we can definitely look into at this point. 06/23/18 on evaluation today patient unfortunately has openings of her bilateral lower extremities. In general she seems to be doing much worse than when I last saw her just as far as her overall health standpoint is concerned. She states that her discomfort is mainly due to neuropathy she's not  having any other issues otherwise. No fevers, chills, nausea, or vomiting noted at this time. 06/30/18 on evaluation today patient actually appears to be doing a  little worse in regard to her right lower extremity her left lower extremity of doing fairly well. Fortunately there is no sign of infection at this time. She has been tolerating the dressing changes without complication. Home health did not come out like they were supposed to for the appropriate wrap changes. They stated that they never received the orders from [place] which were fax. Nonetheless we will send a copy of the orders with the patient today as well. 07/07/18 on evaluation today patient appears to be doing much better in regard to lower extremities. She still has several openings bilaterally although since I last saw her her legs did show obvious signs of infection when she later saw her nurse. Subsequently a culture was obtained and she is been placed on Bactrim and Keflex. Fortunately things seem to be looking much better it does appear she likely had an infection. Again last week we'd even discussed it but again there really was not any obvious sign that she had infection therefore we held off on the antibiotics. Nonetheless I'm glad she's doing better today. 07/14/18 on evaluation today patient appears to be doing much better regarding her bilateral lower Trinity's. In fact on the right lower for me there's nothing open at this point there are some dry skin areas at the sites where she had infection. Fortunately there is no evidence of systemic infection which is excellent news. No fevers chills noted 07/21/18 on evaluation today patient actually appears to be doing much better in regard to her left lower extremity ulcers. She is making good progress and overall I feel like she's improving each time I see her. She's having no pain I do feel like the infection is completely resolved which is excellent news. No fevers, chills, nausea, or  vomiting noted at this time. 07/28/18 on evaluation today patient appears to be doing very well in regard to her left lower [employer]. Everything seems to be showing signs of improvement which is excellent news. Overall very pleased with the progress that has been made. Fortunately there's no evidence of active infection at this time also excellent news. 08/04/18 on evaluation today patient appears to be doing more poorly in regard to her bilateral lower extremities. She has two new areas open up on the right and these were completely closed as of last week. She still has the two spots on the left which in my pinion seem to be doing better. Fortunately there's no evidence of infection again at this point. 08/11/18 on evaluation today patient actually appears to be doing very well in regard to her bilateral lower Trinity wounds that all seem to be doing better and are measures smaller today. Fortunately there's no signs of infection. No fevers, chills, nausea, or vomiting noted at this time. 08/18/18 on evaluation today patient actually appears to be doing about the same inverter bilateral lower extremities. She continues to have areas that blistering open as was drain that fortunately nothing too significant. Overall I feel like Drawtex may have done better for her however compared to the collagen. 08/25/18 on evaluation today patient appears to be doing a little bit more poorly today even compared to last time I saw her. Again I'm not exactly sure why she's making worse progress over the past several weeks. I'm beginning to wonder if there is some kind of underlying low level infection causing this issue. I did actually take a culture from the left anterior lower extremity but it was a new wound draining  quite a bit at this point. Unfortunately she also seems to be having more pain which is what also makes me worried about the possibility of infection. This is despite never erythema noted at this  point. 09/01/18 on evaluation today patient actually appears to be doing a little worse even compared to last week in regard to bilateral lower extremities. She did go to the hospital on the 16th was given a dose of IV Zosyn and then discharged with a recommendation to continue with the Bactrim that I previously prescribed for her. Nonetheless she is still having a lot of discomfort she tells me as well at this time. This is definitely unfortunate. No fevers, chills, nausea, or vomiting noted at this time. 09/08/18 on evaluation today patient's bilateral lower extremities actually appear to be shown signs of improvement which is good news. Fortunately there does not appear to be any signs of active infection I think the anabiotic is helping in this regard. Overall I'm very pleased with how she is progressing. 09/15/18 patient was actually seen in ER yesterday due to her legs as well unfortunately. She states she's been having a lot of pain and discomfort as well as a lot of drainage. Upon inspection today the patient does have a lot of swelling and drainage I feel like this is more related to lymphedema and poor fluid control than it is to infection based on what I'm seeing. The physician in the emergency department also doubted that the patient was having a significant infection nonetheless I see no evidence of infection obvious at this point although I do see evidence of poor fluid control. She still not using a compression pumps, she is not elevating due to her lift chair as well as her hospital bed being broken, and she really is not keeping her legs up as much as they should be and also has been taking off her wraps. All this combined I think has led to poor fluid control and to be honest she may be somewhat volume overloaded in general as well. I recommend that she may need to contact your physician to see if a prescription for a diuretic would be beneficial in their opinion. As long as this is safe I  think it would likely help her. 09/29/18 on evaluation today patient's left lower extremity actually appears to be doing quite a bit better. At least compared to last time that I saw her. She still has a large area where she is draining from but there's a lot of new skin speckled trout and in fact there's more new skin that there are open areas of weeping and drainage at this point. This is good news. With regard to the right lower extremity this is doing much better with the only open area that I really see being a dry spot on the right lateral ankle currently. Fortunately there's no signs of active infection at this time which is good news. No fevers, chills, nausea, or vomiting noted at this time. The patient seems somewhat stressed and overwhelmed during the visit today she was very lethargic as such. She does and she is not taking any pain medications at this point. Apparently according to her husband are also in the process of moving which is probably taking its toll on her as well. 10/06/18 on evaluation today patient appears to be doing rather well in regard to her lower extremities compared to last evaluation. Fortunately there's no signs of active infection. She tells me she did  have an appointment with her primary. Nonetheless he was concerned that the wounds were somewhat deep based on pictures but we never actually saw her legs. She states that he had her somewhat worried due to the fact that she was fearing now that she was [place] have to have an amputation. With that being said based on what I'm seeing check she looks better this week that she has the last two times I've seen her with much less drainage I'm actually pleased in this regard. That doesn't mean that she's out of the water but again I do not think what the point of talking about education at all in regard to her leg. She is very happy to hear this. She is also not having as much pain as she was having last week. 10/13/18  unfortunately on evaluation today patient still continues to have a significant amount of drainage she's not letting home health actually apply the compression dressings at this point. She's trying to use of Juxta-Lite of the top of Kerlex and the second layer of the three layer compression wrap. With that being said she just does not seem to be making as good a progress as I would expect if she was having the compression applied and in place on a regular basis. No fevers, chills, nausea, or vomiting noted at this time. 10/20/18 on evaluation today patient appears to be doing a little better in regard to her bilateral lower extremity ulcers. In fact the right lower extremity seems to be healed she doesn't even have any openings at this point left lower extremity though still somewhat macerated seems to be showing signs of new skin growth at multiple locations throughout. Fortunately there's no evidence of active infection at this time. No fevers, chills, nausea, or vomiting noted at this time. 10/27/18 on evaluation today patient appears to be doing much better in regard to her left lower Trinity ulcer. She's been tolerating the laptop complication and has minimal drainage noted at this point. Fortunately there's no signs of active infection at this time. No fevers, chills, nausea, or vomiting noted at this time. 11/03/18 on evaluation today patient actually appears to be doing excellent in regard to her left lower extremity. She is having very little drainage at this point there does not appear to be any significant signs of infection overall very pleased with how things have gone. She is likewise extremely pleased still and seems to be making wonderful progress week to week. I do believe antibiotics were helpful for her. Her primary care provider did place on amateur clean since I last saw her. 11/17/18 on evaluation today patient appears to be doing worse in regard to her bilateral lower extremities at  this point. She is been tolerating the dressing changes without complication. With that being said she typically takes the Coban off fairly quickly upon arriving home even after being seen here in the clinic and does not allow home health reapply command as part of the dressing at home. Therefore she said no compression essentially since I last saw her as best I can tell. With that being said I think it shows and how much swelling she has in the open wounds that are noted at this point. Fortunately there's no signs of infection but unfortunately if she doesn't get this under control I think she will end up with infection and more significant issues. 11/24/18 on evaluation today patient actually appears to be doing somewhat better in regard to her bilateral lower  extremities. 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 Nonetheless that is doing better currently so she should be able to attempt reinitiating the lymphedema pumps at this time. No fevers, chills, nausea, or vomiting noted at this time. 12/01/18 upon evaluation today patient's left lower extremity appears to be doing quite well unfortunately her right lower extremity is not doing nearly as well. She has been tolerating the dressing changes without complication unfortunately she did not keep a wrap on the right at this time. Nonetheless I believe this has led to increased swelling and weeping in the world is actually much larger than during the last evaluation with her. 12/08/18 on evaluation today patient appears to be doing about the same at this point in regard to her right lower extremity. There is some more palatable to touch I'm concerned about the possibility of there being some infection although I think the main issue is she's not keeping her compression wrap on which in turn is not allowing this  area to heal appropriately. 12/22/18 on evaluation today patient appears to be doing better in regard to left lower extremity unfortunately significantly worse in regard to the right lower extremity. The areas of blistering and necrotic superficial tissue have spread and again this does not really appear to be signs of infection and all she just doesn't seem to be doing nearly as well is what she has been in the past. Overall I feel like the Augmentin did absolutely nothing for her she doesn't seem to have any infection again I really didn't think so last time either is more of a potential preventative measure and hoping that this would make some difference but I think the main issue is she's not wearing her compression. She tells me she cannot wear the [place] been we put on she takes it off pretty much upon getting home. Subsequently she worshiped Juxta-Lite when I questioned her about how often she wears it this is no more than three hours a day obviously that leaves 21 hours that she has no compression and this is obviously not doing well for her. Overall I'm concerned that if things continue to worsen she is at great risk of both infection as well as losing her leg. 01/05/19 on evaluation today patient appears to be doing well in regard to her left lower extremity which he is allowing [place] to wrap and not so well with regard to her right lower extremity which she is not allowing [place] to really wrap and keep the wrap on. She states that it hurts too badly whenever it's wrapped and she ends up having to take it off. She's been using the Juxta-Lite she tells me up to six hours a day although I question whether or not that's really been the case to be honest. Previously she told me three hours today nonetheless obviously the legs as long as the wrap is doing great when she is not is doing much more poorly. 01/12/2019 on evaluation today patient actually appears to be doing a little better in my opinion with  regard to her right lower extremity ulcer. She has a small open area on the left lower extremity unfortunately but again this I think is part of the normal fluctuation of what she is going to have to expect with regard to her  legs especially when she is not using her lymphedema pumps on a regular basis. Subsequently based on what I am seeing today I think that she does seem to be doing slightly better with regard to her right lower extremity she did see her primary care provider on Monday they felt she had an infection and placed her on 2 antibiotics. Both Cipro and clindamycin. Subsequently again she seems possibly to be doing a little bit better in regards to the right lower extremity she also tells me however she has been wearing the compression wrap over the past week since I spoke with her as well that is a Kerlix and Coban wrap on the right. No fevers, chills, nausea, vomiting, or diarrhea. 01/19/2019 on evaluation today patient appears to be doing better with regard to her bilateral lower extremities especially the right. I feel like the compression has been beneficial for her which is great news. She did get a call from her primary care provider on her way here today telling her that she did have methicillin- resistant Staphylococcus aureus and he was calling in a couple new antibiotics for her including a ointment to be applied she tells me 3 times a day. With that being said this sounds like likely to be Bactroban which I think could be applied with each dressing/wrap change but I would not be able to accommodate her applying this 3 times a day. She is in agreement with the least doing this we will add that to her orders today. 01/26/2019 on evaluation today patient actually appears to be doing much better with regard to her right lower extremity. Her left lower extremity is also doing quite well all things considering. Fortunately there is no evidence of active infection at this time. No fevers,  chills, nausea, vomiting, or diarrhea. 02/02/2019 on evaluation today patient appears to be doing much better compared to her last evaluation. Little by little off like her right leg is returning more towards normal. There does not appear to be any signs of active infection and overall she seems to be doing quite well which is great news. I am very pleased in this regard. No fevers, chills, nausea, vomiting, or diarrhea. 02/09/2019 upon evaluation today patient appears to be doing better with regard to her bilateral lower extremities. She has been tolerating the dressing changes without complication. Fortunately there is no signs of active infection at this time. No fevers, chills, nausea, vomiting, or diarrhea. 02/23/2019 on evaluation today patient actually appears to be doing quite well with regard to her bilateral lower extremities. She has been tolerating the dressing changes without complication. She is even used her pumps one time and states that she really felt like it felt good. With that being said she seems to be in good spirits and her legs appear to be doing excellent. 03/09/2019 on evaluation today patient appears to be doing well with regard to her right lower extremity there are no open wounds at this time she is having some discomfort but I feel like this is more neuropathy than anything. With regard to her left lower extremity she had several areas scattered around that she does have some weeping and drainage from but again overall she does not appear to be having any significant issues and no evidence of infection at this time which is good news. 03/23/2019 on evaluation today patient appears to be doing well with regard to her right lower extremity which she tells me is still close she is using  her juxta light here. Her left lower extremity she mainly just has an area on the foot which is still slightly draining although this also is doing great. Overall very pleased at this  time. 04/06/2019 patient appears to be doing a little bit worse in regard to her left lower extremity upon evaluation today. She feels like this could be becoming infected again which she had issues with previous. Fortunately there is no signs of systemic infection but again this is always a struggle with her with her legs she will go from doing well to not so well in a very short amount of time. 04/20/2019 on evaluation today patient actually appears to be doing quite well with regard to her right lower extremity I do not see any signs of active infection at this time. Fortunately there is no fever chills noted. She is still taking the antibiotics which I prescribed for her at this point. In regard to the left lower extremity I do feel like some of these areas are better although again she still is having weeping from several locations at this time. 04/27/2019 on evaluation today patient appears to be doing about the same if not slightly worse in regard to her left lower extremity ulcers. She tells me when questioned that she has been sleeping in her Hoveround chair in fact she tells me she falls asleep without even knowing it. I think she is spending a whole lot of time in the chair and less time walking and moving around which is not good for her legs either. On top of that she is in a seated position which is also the worst position she is not really elevating her legs and she is also not using her lymphedema pumps. All this is good to contribute to worsening of her condition in general. 05/18/2019 on evaluation today patient appears to be doing well with regard to her lower extremity on the right in fact this is showing no signs of any open wounds at this time. On the left she is continuing to have issues with areas that do drain. Some of the regions have healed and there are couple areas that have reopened. She did go to the ER per the patient according to recommendations from the home health nurse  due to what she was seen when she came out on 05/13/2019. Subsequently she felt like the patient needed to go to the hospital due to the fact that again she was having "milky white discharge" from her leg. Nonetheless she had and then was placed on doxycycline and subsequently seems to be doing better. 06/01/2019 upon evaluation today patient appears to be doing really in my opinion about the same. I do not see any signs of active infection which is good news. Overall she still has wounds over the bilateral lower extremities she has reopened on the right but this appears to be more of a crack where there is weeping/edema coming from the region. I do not see any evidence of infection at either site based on what I visualized today. 07/13/2019 upon evaluation today patient appears to be doing a little worse compared to last time I saw her. She since has been in the hospital from 06/21/2019 through 06/29/2019. This was secondary to having Covid. During that time they did apply lotion to her legs which unfortunately has caused her to develop a myriad of open wounds on her lower extremities. Her legs do appear to be doing better as far as the overall  appearance is concerned but nonetheless she does have more open and weeping areas. 07/27/2019 upon evaluation today patient appears to be doing more poorly to be honest in regard to her left lower extremity in particular. There is no signs of systemic infection although I do believe she may have local infection. She notes she has been having a lot of blue/green drainage which is consistent potentially with Pseudomonas. That may be something that we need to consider here as well. The doxycycline does not seem to have been helping. 08/03/2019 upon evaluation today patient appears to be doing a little better in my opinion compared to last week's evaluation. Her culture I did review today and she is on appropriate medications to help treat the Enterobacter that was  noted. Overall I feel like that is good news. With that being said she is unfortunately continuing to have a lot of drainage and though it is doing better I still think she has a long ways to go to get things dried up in general. Fortunately there is no signs of systemic infection. 08/10/2019 upon evaluation today patient appears to be doing may be slightly better in regard to her left lower extremity the right lower extremity is doing much better. Fortunately there is no signs of infection right now which is good news. No fevers, chills, nausea, vomiting, or diarrhea. 08/24/2019 on evaluation today patient appears to be doing slightly better in regard to her lower extremities. The left lower extremity seems to be healed the right lower extremity is doing better though not completely healed as far as the openings are concerned. She has some generalized issues here with edema and weeping secondary to her lymphedema though again I do believe this is little bit drier compared to prior weeks evaluations. In general I am very pleased with how things seem to be progressing. No fevers, chills, nausea, vomiting, or diarrhea. 08/31/2019 upon evaluation today patient actually seems to making some progress here with regard to the left lower extremity in particular. She has been tolerating the dressing changes without complication. Fortunately there is no signs of active infection at this time. No fevers, chills, nausea, vomiting, or diarrhea. She did see Dr. Doren Custard and he did note that she did have a issue with the left great saphenous vein and the small saphenous vein in the leg. With that being said he was concerned about the possibility of laser ablation not being extremely successful. He also mentioned a small risk of DVT associated with the procedure. However if the wounds do not continue to improve he stated that that would probably be the way to go. Fortunately the patient's legs do seem to be doing much  better. 09/07/2019 upon evaluation today patient appears to be doing better with regard to her lower extremities. She has been tolerating the dressing changes without complication. With that being said she is showing signs of improvement and overall very pleased. There are some areas on her leg that I think we do need to debride we discussed this last week the patient is in agreement with doing that as long as it does not hurt too badly. 09/14/2019 upon evaluation today patient appears to be doing decently well with regard to her left lower extremity. She is not having near as much weeping as she has had in the past things seem to be drying up which is good news. There is no signs of active infection at this time. 09/21/19 upon evaluation today patient appears to be doing  better in regard overall to her bilateral lower extremities. She again has less open than she did previous and each week I feel like this is getting better. Fortunately there is no signs of active infection at this time. No fevers, chills, nausea, vomiting, or diarrhea. 09/28/2019 upon evaluation today patient actually appears to be showing signs of improvement with regard to her left lower extremity. Unfortunately the right medial lower extremity around the ankle region has reopened to some degree but this appears to be minimal still which is good news. There is no signs of active infection at this time which is also good news. 10/12/2019 upon evaluation today patient appears to be doing okay with regard to her bilateral lower extremities today. The right is a little bit worse then last evaluation 2 weeks ago. The left is actually doing a little better in my opinion. Overall there is no signs of active infection at this time that I see. Obviously that something we have to keep a close eye on she is very prone to this with the significant and multiple openings that she has over the bilateral lower extremities. 10/19/2019 upon evaluation  today patient appears to be doing about the best that I have seen her in quite some time. She has been tolerating the dressing changes without complication. There does not appear to be any signs of active infection and overall I am extremely happy with the way her legs appeared. She is drying up quite nicely and overall is having less pain. 11/09/2019 upon evaluation today patient appears to be doing better in regard to her wounds. She seems to be drying up more and more each time I see her this is just taking a very long time. Fortunately there is no signs of active infection at this time. 11/23/2019 upon evaluation today patient actually appears to be doing excellent in regard to her lower extremities at this point compared to where she has been. Fortunately there is no signs of active infection at this time. She did go to the hospital last week for nausea and vomiting completely unrelated to her wounds. Fortunately she is doing better she was given some Reglan and got better. She had associated abdominal pain but they never found out what was going on. 12/07/2019 upon evaluation today patient appears to be doing well for the most part in regard to her legs. She unfortunately has not been keeping the Coban portion of her wraps on therefore the compression has not really been sufficient for what it is supposed to be. Nonetheless she tells me that it just hurt too bad therefore she removed it. 12/21/2019 upon evaluation today patient actually appears to be doing quite well with regard to her legs. I do feel like she has been making progress which is great news and overall there is no signs of active infection at this time. No fevers, chills, nausea, vomiting, or diarrhea. 01/04/2020 upon evaluation today patient presents for follow-up concerning her lower extremity edema bilaterally. She still has open wounds she has not been using her lymphedema pumps. She is also not been utilizing her compression wraps  appropriately she tends to unwrap them, take them off, or states that they hurt. Obviously the reason they hurt is because her legs start to swell but the issue is if she would use her compression/lymphedema pumps regularly she would not swell and she would have the pain. Nonetheless she has not even picked them up once honestly over the past several months and  may be even as much as in the past year based on my opinion and what have seen. She tells me today that after last week when I talked about this with her specifically actually that was 2 weeks ago that she "forgot". 8//21 on evaluation today patient appears to be doing a little better in regard to her legs bilaterally. Fortunately there is no signs of active infection at this time. She tells me that she used her lymphedema pumps all of one time over the past 2 weeks since I last saw her. She tells me that she has been too busy in order to continue to use these. 02/01/2020 on evaluation today patient appears to be doing some better in regard to her wounds in general in her legs. We felt the right was healed although is not completely it does appear to be doing better she tells me she has been using her lymphedema pumps that she has had this six times since I last saw her. Obviously the more she does that the better she would do my opinion 02/15/2020 upon evaluation today patient appears to be doing about the same in regard to her legs. She tells me that she is pumping I'm still not sure how much she does to be perfectly honest. However even if she does a little bit here and there I guess that is better than nothing. Fortunately there is no sign of active infection at this time which is great news. No fevers, chills, nausea, vomiting, or diarrhea. 02/29/2020 on evaluation today patient actually appears to be doing quite well all things considered this week. She has been tolerating the dressing changes without complication. Fortunately there is no  signs of active infection at this time. No fevers, chills, nausea, vomiting, or diarrhea. 03/14/2020 upon evaluation today patient appears to be doing really about the same in regard to her legs. There is no signs of improvement overall and she as noted from home health does not appear to be elevating her legs he can get into her lift chair. There is too much stuff piled up on it the patient tells me. She also tells me she cannot really use her pumps effectively due to the fact that she cannot have any space to get them on. Finally she is also not really elevating her legs because she is not sleeping in her bed she is sleeping in her chair currently and again overall I think everything that she is done in combination has been exactly the wrong thing for what she needs for her legs. 04/04/2020 upon evaluation today patient appears to be doing well at this time with regard to her legs. She is actually been pumping, keeping her wraps on, and to be honest she seems to be doing dramatically better the right leg is excellent the left leg is also excellent and measuring much smaller than previous. 04/18/2020 upon evaluation today patient actually is continue to make good progress in regard to her lower extremities bilaterally. Everything is improving and less wet that has been in the past overall I am extremely pleased with where things stand and I think that she is making great progress. The patient tells me she still continue to use her compression pumps Objective Constitutional Obese and well-hydrated in no acute distress. Vitals Time Taken: 10:22 AM, Height: 62 in, Weight: 335 lbs, BMI: 61.3, T emperature: 97.7 F, Pulse: 90 bpm, Respiratory Rate: 20 breaths/min, Blood Pressure: 182/72 mmHg. General Notes: Per patient early this am patient  said she had an allergic reaction to the only new medication/change from insulin pen to vial. Per patient lips and facial swelling. Per patient son called EMS and EMS  treated patient. Per patient did not go to the ED. Per patient is currently taking Benadryl and is calling PCP today. Respiratory normal breathing without difficulty. Psychiatric this patient is able to make decisions and demonstrates good insight into disease process. Alert and Oriented x 3. pleasant and cooperative. General Notes: Upon inspection patient's wound bed actually showed signs of good granulation at this time and excellent epithelization she does have a lot of dry skin but again as I explained to her today she has pretty much to states she is a very extremely macerated and wet or else her skin is very dry. Right now were on the better side of those 2 options in my opinion. Integumentary (Hair, Skin) Wound #61 status is Open. Original cause of wound was Gradually Appeared. The wound is located on the Left,Circumferential Lower Leg. The wound measures 9.5cm length x 16cm width x 0.1cm depth; 119.381cm^2 area and 11.938cm^3 volume. There is Fat Layer (Subcutaneous Tissue) exposed. There is no tunneling or undermining noted. There is a medium amount of serous drainage noted. The wound margin is flat and intact. There is large (67-100%) pink, pale granulation within the wound bed. There is a small (1-33%) amount of necrotic tissue within the wound bed including Adherent Slough. Wound #64 status is Open. Original cause of wound was Gradually Appeared. The wound is located on the Left,Dorsal Foot. The wound measures 6.3cm length x 6cm width x 0.1cm depth; 29.688cm^2 area and 2.969cm^3 volume. There is Fat Layer (Subcutaneous Tissue) exposed. There is no tunneling or undermining noted. There is a medium amount of serous drainage noted. The wound margin is flat and intact. There is large (67-100%) pink, pale granulation within the wound bed. There is a small (1-33%) amount of necrotic tissue within the wound bed including Adherent Slough. Wound #66 status is Open. Original cause of wound was  Gradually Appeared. The wound is located on the Right,Anterior Lower Leg. The wound measures 0.5cm length x 1.1cm width x 0.1cm depth; 0.432cm^2 area and 0.043cm^3 volume. There is Fat Layer (Subcutaneous Tissue) exposed. There is no tunneling or undermining noted. There is a small amount of serosanguineous drainage noted. The wound margin is distinct with the outline attached to the wound base. There is medium (34-66%) pink, pale granulation within the wound bed. There is a medium (34-66%) amount of necrotic tissue within the wound bed including Adherent Slough. 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 There was a Three Layer Compression Therapy Procedure by Carlene Coria, RN. 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 There was  a Three Layer Compression Therapy Procedure by Carlene Coria, RN. Post procedure Diagnosis Wound #66: Same as Pre-Procedure Plan Follow-up Appointments: Return Appointment in 2 weeks. Dressing Change Frequency: Wound #61 Left,Circumferential Lower Leg: Other: - 2  times per week Wound #64 Left,Dorsal Foot: Other: - 2 times per week Skin Barriers/Peri-Wound Care: Moisturizing lotion - to dry skin Wound Cleansing: Clean wound with Wound Cleanser - wash legs with soap and water with dressing changes May shower with protection. Primary Wound Dressing: Wound #61 Left,Circumferential Lower Leg: Calcium Alginate with Silver Wound #64 Left,Dorsal Foot: Calcium Alginate with Silver Wound #66 Right,Anterior Lower Leg: Calcium Alginate with Silver Secondary Dressing: Wound #61 Left,Circumferential Lower Leg: Dry Gauze ABD pad - as needed Wound #64 Left,Dorsal Foot: Dry Gauze ABD pad - as needed Wound #66 Right,Anterior Lower Leg: Dry Gauze ABD pad - as needed Edema Control: 3 Layer Compression System - Bilateral Avoid standing for long periods of time - walking is encouraged Elevate legs to the level of the heart or above for 30 minutes daily and/or when sitting, a frequency of: - do not sleep in chair with feet dangling, MUST elevate legs in recliner while sitting Exercise regularly Segmental Compressive Device. - lymphedema pumps 60 minutes 1- 2 times per day Off-Loading: Turn and reposition every 2 hours Additional Orders / Instructions: Follow Nutritious Diet - T include vitamin A, vitamin C, and Zinc along with increased protein intake. o Home Health: [facility] skilled [facility] for wound care. - [facility]. I would recommend currently that we going to continue with the wound care measures as before. We will using silver alginate to the open wound locations and cover the remaining area and overall the patient seems to be doing quite well with this. 2. I am also can recommend at this time the patient continue to monitor for any signs of worsening infection. Obviously if she has any increased drainage or increased pain that could be a concerning factor here. 3. We will otherwise continue with the compression wraps. We have been  utilizing a bilateral 3 layer compression wrap which we will continue. We will see patient back for reevaluation in 2 weeks here in the clinic. If anything worsens or changes patient will contact our office for additional recommendations. Electronic Signature(s) Signed: 04/18/2020 1:18:00 PM By: [name] PA-C Entered By: [name] on 04/18/2020 13:17:59 -------------------------------------------------------------------------------- SuperBill Details Patient Name: Date of Service: [name] 04/18/2020 Medical Record Number: [id number] Patient Account Number: [id number] Date of Birth/Sex: Treating RN: [birth year] (69 y.o. [name] Primary Care Provider: [name] Other Clinician: Referring Provider: Treating Provider/Extender: Doyle Askew, FRED Weeks in Treatment: 141 Diagnosis Coding ICD-10 Codes Code Description E11.622 Type 2 diabetes mellitus with other skin ulcer I89.0 Lymphedema, not elsewhere classified I87.331 Chronic venous hypertension (idiopathic) with ulcer and inflammation of right lower extremity I87.332 Chronic venous hypertension (idiopathic) with ulcer and inflammation of left lower extremity L97.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 leg with fat layer exposed L97.822 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 I10 Essential (primary) hypertension E66.01 Morbid (severe) obesity due to excess calories F41.8 Other specified anxiety disorders R53.1 Weakness Facility Procedures The patient participates with Medicare or their insurance follows the Medicare Facility Guidelines: CPT4 Description Modifier Quantity Code [id number] BILATERAL: Application of multi-layer venous compression system; leg (below  knee), including ankle and 1 foot. Physician Procedures : CPT4 Code Description  Modifier [id number] - WC PHYS LEVEL 3 - EST PT ICD-10 Diagnosis Description E11.622 Type 2 diabetes mellitus with other skin ulcer I89.0 Lymphedema, not elsewhere classified I87.331 Chronic venous hypertension (idiopathic) with  ulcer and inflammation of right lower extremity I87.332 Chronic venous hypertension (idiopathic) with ulcer and inflammation of left lower extremity Quantity: 1 Electronic Signature(s) Signed: 04/18/2020 1:18:22 PM By: [name] PA-C Entered By: [name] on 04/18/2020 13:18:12

## 2020-04-20 NOTE — Progress Notes (Signed)
[name] ([id number]) Visit Report for 04/18/2020 Arrival Information Details Patient Name: Date of Service: [name], [name] 04/18/2020 10:15 A M Medical Record Number: [id number] Patient Account Number: [id number] Date of Birth/Sex: Treating RN: March 27, 1951 (69 y.o. [name], Meta.Reding Primary Care Peterson Mathey: [name] Other Clinician: Referring Bill Mcvey: Treating Boluwatife Mutchler/Extender: Doyle Askew, FRED Weeks in Treatment: 141 Visit Information History Since Last Visit Added or deleted any medications: No Patient Arrived: Wheel Chair Any new allergies or adverse reactions: No Arrival Time: 10:22 Had a fall or experienced change in No Accompanied By: self activities of daily living that may affect Transfer Assistance: None risk of falls: Patient Identification Verified: Yes Signs or symptoms of abuse/neglect since last visito No Secondary Verification Process Completed: Yes Hospitalized since last visit: No Patient Requires Transmission-Based Precautions: No Implantable device outside of the clinic excluding No Patient Has Alerts: Yes cellular tissue based products placed in the center Patient Alerts: R ABI= 1.01 since last visit: L ABI = .99 Has Dressing in Place as Prescribed: Yes Has Compression in Place as Prescribed: Yes Pain Present Now: No Electronic Signature(s) Signed: 04/18/2020 5:31:28 PM By: Deon Pilling Entered By: Deon Pilling on 04/18/2020 10:22:54 -------------------------------------------------------------------------------- Compression Therapy Details Patient Name: Date of Service: [name]. 04/18/2020 10:15 A M Medical Record Number: [id number] Patient Account Number: [id number] Date of Birth/Sex: Treating RN: Mar 04, 1951 (69 y.o. [name] Primary Care Fuad Forget: [name] Other Clinician: Referring Etty Isaac: Treating Lexandra Rettke/Extender: Doyle Askew, FRED Weeks in Treatment: 141 Compression Therapy Performed  for Wound Assessment: Wound #61 Left,Circumferential Lower Leg Performed By: Clinician Carlene Coria, RN Compression Type: Three Layer Post Procedure Diagnosis Same as Pre-procedure Electronic Signature(s) Signed: 04/18/2020 5:51:45 PM By: [name] RN, BSN Entered By: [name] on 04/18/2020 11:37:50 -------------------------------------------------------------------------------- Compression Therapy Details Patient Name: Date of Service: [name]. 04/18/2020 10:15 A M Medical Record Number: [id number] Patient Account Number: [id number] Date of Birth/Sex: Treating RN: 05-Jan-1951 (69 y.o. [name] Primary Care Shevon Sian: [name] Other Clinician: Referring Severo Beber: Treating Azhar Yogi/Extender: Doyle Askew, FRED Weeks in Treatment: 141 Compression Therapy Performed for Wound Assessment: Wound #66 Right,Anterior Lower Leg Performed By: Clinician Carlene Coria, RN Compression Type: Three Layer Post Procedure Diagnosis Same as Pre-procedure Electronic Signature(s) Signed: 04/18/2020 5:51:45 PM By: [name] RN, BSN Entered By: [name] on 04/18/2020 11:37:51 -------------------------------------------------------------------------------- Encounter Discharge Information Details Patient Name: Date of Service: [name]. 04/18/2020 10:15 A M Medical Record Number: [id number] Patient Account Number: [id number] Date of Birth/Sex: Treating RN: 06/08/1951 (69 y.o. [name] Primary Care Monty Spicher: [name] Other Clinician: Referring Tariya Morrissette: Treating Pang Robers/Extender: Doyle Askew, FRED Weeks in Treatment: [phone] Encounter Discharge Information Items Discharge Condition: Stable Ambulatory Status: Wheelchair Discharge Destination: Home Transportation: Private Auto Accompanied By: self Schedule Follow-up Appointment: Yes Clinical Summary of Care: Patient Declined Electronic Signature(s) Signed: 04/20/2020  5:00:24 PM By: Carlene Coria RN Entered By: Carlene Coria on 04/18/2020 11:59:52 -------------------------------------------------------------------------------- Lower Extremity Assessment Details Patient Name: Date of Service: [name] 04/18/2020 10:15 A M Medical Record Number: [id number] Patient Account Number: [id number] Date of Birth/Sex: Treating RN: [birth year] (69 y.o. [name] Primary Care Armanii Pressnell: [name] Other Clinician: Referring Andrews Tener: Treating Lahna Nath/Extender: Doyle Askew, FRED Weeks in Treatment: 141 Edema Assessment Assessed: [Left: Yes] [Right: Yes] Edema: [Left: Yes] [Right: Yes] Calf Left: Right: Point of Measurement: 37 cm From Medial Instep 38.5 cm 36.5 cm Ankle Left: Right: Point of Measurement: 10  cm From Medial Instep 29 cm 24 cm Electronic Signature(s) Signed: 04/18/2020 5:31:28 PM By: Deon Pilling Entered By: Deon Pilling on 04/18/2020 10:33:46 -------------------------------------------------------------------------------- [place] Details Patient Name: Date of Service: [name]. 04/18/2020 10:15 A M Medical Record Number: [id number] Patient Account Number: [id number] Date of Birth/Sex: Treating RN: 03/02/1951 (69 y.o. [name] Primary Care Raylen Ken: [name] Other Clinician: Referring Sharyl Panchal: Treating Jossalin Chervenak/Extender: Doyle Askew, FRED Weeks in Treatment: [phone] Active Inactive Venous Leg Ulcer Nursing Diagnoses: Actual venous Insuffiency (use after diagnosis is confirmed) Knowledge deficit related to disease process and management Goals: Patient will maintain optimal edema control Date Initiated: 08/12/2017 Target Resolution Date: 05/09/2020 Goal Status: Active Patient/caregiver will verbalize understanding of disease process and disease management Date Initiated: 08/12/2017 Date Inactivated: 04/21/2018 Target Resolution Date: 04/24/2018 Goal Status:  Met Interventions: Assess peripheral edema status every visit. Compression as ordered Treatment Activities: Therapeutic compression applied : 08/12/2017 Notes: Wound/Skin Impairment Nursing Diagnoses: Impaired tissue integrity Knowledge deficit related to ulceration/compromised skin integrity Goals: Patient/caregiver will verbalize understanding of skin care regimen Date Initiated: 08/12/2017 Target Resolution Date: 05/09/2020 Goal Status: Active Ulcer/skin breakdown will have a volume reduction of 30% by week 4 Date Initiated: 08/05/2017 Date Inactivated: 09/30/2017 Target Resolution Date: 10/03/2017 Goal Status: Met Ulcer/skin breakdown will have a volume reduction of 50% by week 8 Date Initiated: 09/30/2017 Date Inactivated: 10/28/2017 Target Resolution Date: 10/28/2017 Goal Status: Met Interventions: Assess patient/caregiver ability to perform ulcer/skin care regimen upon admission and as needed Assess ulceration(s) every visit Provide education on ulcer and skin care Screen for HBO Treatment Activities: Patient referred to home care : 08/05/2017 Skin care regimen initiated : 08/05/2017 Topical wound management initiated : 08/05/2017 Notes: Electronic Signature(s) Signed: 04/18/2020 5:51:45 PM By: [name] RN, BSN Entered By: [name] on 04/18/2020 11:36:39 -------------------------------------------------------------------------------- Pain Assessment Details Patient Name: Date of Service: [name]. 04/18/2020 10:15 A M Medical Record Number: [id number] Patient Account Number: [id number] Date of Birth/Sex: Treating RN: 05/20/1951 (69 y.o. [name] Primary Care Kaleia Longhi: [name] Other Clinician: Referring Mitchel Delduca: Treating Wendolyn Raso/Extender: Doyle Askew, FRED Weeks in Treatment: [phone] Active Problems Location of Pain Severity and Description of Pain Patient Has Paino No Site Locations Rate the pain. Current Pain Level:  0 Pain Management and Medication Current Pain Management: Medication: No Cold Application: No Rest: No Massage: No Activity: No T.E.N.S.: No Heat Application: No Leg drop or elevation: No Is the Current Pain Management Adequate: Adequate How does your wound impact your activities of daily livingo Sleep: No Bathing: No Appetite: No Relationship With Others: No Bladder Continence: No Emotions: No Bowel Continence: No Work: No Toileting: No Drive: No Dressing: No Hobbies: No Electronic Signature(s) Signed: 04/18/2020 5:31:28 PM By: Deon Pilling Entered By: Deon Pilling on 04/18/2020 10:33:13 -------------------------------------------------------------------------------- Patient/Caregiver Education Details Patient Name: Date of Service: [name] 11/3/2021andnbsp10:15 A M Medical Record Number: [id number] Patient Account Number: [id number] Date of Birth/Gender: Treating RN: 07-19-1950 (69 y.o. [name] Primary Care Physician: [name] Other Clinician: Referring Physician: Treating Physician/Extender: Doyle Askew, FRED Weeks in Treatment: [phone] Education Assessment Education Provided To: Patient Education Topics Provided Venous: Methods: Explain/Verbal Responses: Reinforcements needed, State content correctly Wound/Skin Impairment: Methods: Explain/Verbal Responses: Reinforcements needed, State content correctly Electronic Signature(s) Signed: 04/18/2020 5:51:45 PM By: [name] RN, BSN Entered By: [name] on 04/18/2020 11:37:17 -------------------------------------------------------------------------------- Wound Assessment Details Patient Name: Date of Service: [name]. 04/18/2020 10:15 A M Medical Record Number:  [id number] Patient Account Number: [id number] Date of Birth/Sex: Treating RN: Jan 24, 1951 (69 y.o. [name] Primary Care Donni Oglesby: [name] Other Clinician: Referring  Zalyn Amend: Treating Jenniah Bhavsar/Extender: Doyle Askew, FRED Weeks in Treatment: 141 Wound Status Wound Number: 61 Primary Venous Leg Ulcer Etiology: Wound Location: Left, Circumferential Lower Leg Wound Open Wounding Event: Gradually Appeared Status: Date Acquired: 04/20/2019 Comorbid Asthma, Hypertension, Peripheral Arterial Disease, Peripheral Weeks Of Treatment: 52 History: Venous Disease, Type II Diabetes, Gout, Osteoarthritis Clustered Wound: Yes Wound Measurements Length: (cm) 9.5 Width: (cm) 16 Depth: (cm) 0.1 Clustered Quantity: 2 Area: (cm) 119.381 Volume: (cm) 11.938 Wound Description Classification: Full Thickness Without Exposed Support Structu Wound Margin: Flat and Intact Exudate Amount: Medium Exudate Type: Serous Exudate Color: amber Foul Odor After Cleansing: Slough/Fibrino % Reduction in Area: -1483.3% % Reduction in Volume: -1483.3% Epithelialization: Large (67-100%) Tunneling: No Undermining: No res No Yes Wound Bed Granulation Amount: Large (67-100%) Exposed Structure Granulation Quality: Pink, Pale Fascia Exposed: No Necrotic Amount: Small (1-33%) Fat Layer (Subcutaneous Tissue) Exposed: Yes Necrotic Quality: Adherent Slough Tendon Exposed: No Muscle Exposed: No Joint Exposed: No Bone Exposed: No Treatment Notes Wound #61 (Left, Circumferential Lower Leg) 1. Cleanse With Wound Cleanser Soap and water 3. Primary Dressing Applied Calcium Alginate Ag 4. Secondary Dressing Dry Gauze 6. Support Layer Applied 3 layer compression wrap Notes stocknette Electronic Signature(s) Signed: 04/18/2020 5:31:28 PM By: Deon Pilling Entered By: Deon Pilling on 04/18/2020 10:35:00 -------------------------------------------------------------------------------- Wound Assessment Details Patient Name: Date of Service: [name], [name] 04/18/2020 10:15 A M Medical Record Number: [id number] Patient Account Number: [id number] Date of  Birth/Sex: Treating RN: [birth year] (68 y.o. [name] Primary Care Nedda Gains: [name] Other Clinician: Referring Carmon Brigandi: Treating Annibelle Brazie/Extender: Doyle Askew, FRED Weeks in Treatment: 141 Wound Status Wound Number: 64 Primary Diabetic Wound/Ulcer of the Lower Extremity Etiology: Wound Location: Left, Dorsal Foot Wound Open Wounding Event: Gradually Appeared Status: Date Acquired: 06/01/2019 Comorbid Asthma, Hypertension, Peripheral Arterial Disease, Peripheral Weeks Of Treatment: 46 History: Venous Disease, Type II Diabetes, Gout, Osteoarthritis Clustered Wound: No Wound Measurements Length: (cm) 6.3 Width: (cm) 6 Depth: (cm) 0.1 Area: (cm) 29.688 Volume: (cm) 2.969 % Reduction in Area: -17892.7% % Reduction in Volume: -5959.2% Epithelialization: Medium (34-66%) Tunneling: No Undermining: No Wound Description Classification: Grade 1 Wound Margin: Flat and Intact Exudate Amount: Medium Exudate Type: Serous Exudate Color: amber Foul Odor After Cleansing: No Slough/Fibrino Yes Wound Bed Granulation Amount: Large (67-100%) Exposed Structure Granulation Quality: Pink, Pale Fascia Exposed: No Necrotic Amount: Small (1-33%) Fat Layer (Subcutaneous Tissue) Exposed: Yes Necrotic Quality: Adherent Slough Tendon Exposed: No Muscle Exposed: No Joint Exposed: No Bone Exposed: No Treatment Notes Wound #64 (Left, Dorsal Foot) 1. Cleanse With Wound Cleanser Soap and water 3. Primary Dressing Applied Calcium Alginate Ag 4. Secondary Dressing Dry Gauze 6. Support Layer Applied 3 layer compression wrap Notes stocknette Electronic Signature(s) Signed: 04/18/2020 5:31:28 PM By: Deon Pilling Entered By: Deon Pilling on 04/18/2020 10:35:39 -------------------------------------------------------------------------------- Wound Assessment Details Patient Name: Date of Service: [name], [name] 04/18/2020 10:15 A M Medical Record Number:  [id number] Patient Account Number: [id number] Date of Birth/Sex: Treating RN: 06/09/1951 (69 y.o. [name] Primary Care Taniela Feltus: [name] Other Clinician: Referring Aryianna Earwood: Treating Normand Damron/Extender: Doyle Askew, FRED Weeks in Treatment: 141 Wound Status Wound Number: 66 Primary Diabetic Wound/Ulcer of the Lower Extremity Etiology: Wound Location: Right, Anterior Lower Leg Wound Open Wounding Event: Gradually Appeared Status: Date Acquired: 03/14/2020 Comorbid Asthma, Hypertension, Peripheral Arterial Disease, Peripheral  Weeks Of Treatment: 5 History: Venous Disease, Type II Diabetes, Gout, Osteoarthritis Clustered Wound: No Wound Measurements Length: (cm) 0.5 Width: (cm) 1.1 Depth: (cm) 0.1 Area: (cm) 0.432 Volume: (cm) 0.043 % Reduction in Area: -120.4% % Reduction in Volume: -115% Epithelialization: Large (67-100%) Tunneling: No Undermining: No Wound Description Classification: Grade 2 Wound Margin: Distinct, outline attached Exudate Amount: Small Exudate Type: Serosanguineous Exudate Color: red, brown Wound Bed Granulation Amount: Medium (34-66%) Granulation Quality: Pink, Pale Necrotic Amount: Medium (34-66%) Necrotic Quality: Adherent Slough Foul Odor After Cleansing: No Slough/Fibrino Yes Exposed Structure Fascia Exposed: No Fat Layer (Subcutaneous Tissue) Exposed: Yes Tendon Exposed: No Muscle Exposed: No Joint Exposed: No Bone Exposed: No Treatment Notes Wound #66 (Right, Anterior Lower Leg) 1. Cleanse With Wound Cleanser Soap and water 3. Primary Dressing Applied Calcium Alginate Ag 4. Secondary Dressing Dry Gauze 6. Support Layer Applied 3 layer compression wrap Notes stocknette Electronic Signature(s) Signed: 04/18/2020 5:31:28 PM By: Deon Pilling Entered By: Deon Pilling on 04/18/2020 10:36:18 -------------------------------------------------------------------------------- Vitals Details Patient Name: Date  of Service: [name]. 04/18/2020 10:15 A M Medical Record Number: [id number] Patient Account Number: [id number] Date of Birth/Sex: Treating RN: [birth year] (68 y.o. [name] Primary Care Jameal Razzano: [name] Other Clinician: Referring Jarelyn Bambach: Treating Maximo Spratling/Extender: Doyle Askew, FRED Weeks in Treatment: 141 Vital Signs Time Taken: 10:22 Temperature (F): 97.7 Height (in): 62 Pulse (bpm): 90 Weight (lbs): 335 Respiratory Rate (breaths/min): 20 Body Mass Index (BMI): 61.3 Blood Pressure (mmHg): 182/72 Reference Range: 80 - 120 mg / dl Notes Per patient early this am patient said she had an allergic reaction to the only new medication/change from insulin pen to vial. Per patient lips and facial swelling. Per patient son called EMS and EMS treated patient. Per patient did not go to the ED. Per patient is currently taking Benadryl and is calling PCP today. Electronic Signature(s) Signed: 04/18/2020 5:31:28 PM By: Deon Pilling Entered By: Deon Pilling on 04/18/2020 10:33:04

## 2020-05-02 ENCOUNTER — Encounter (HOSPITAL_BASED_OUTPATIENT_CLINIC_OR_DEPARTMENT_OTHER): Payer: Medicare PPO | Admitting: Physician Assistant

## 2020-05-02 ENCOUNTER — Other Ambulatory Visit: Payer: Self-pay

## 2020-05-02 DIAGNOSIS — E11622 Type 2 diabetes mellitus with other skin ulcer: Secondary | ICD-10-CM | POA: Diagnosis not present

## 2020-05-02 NOTE — Progress Notes (Addendum)
[name], [name] ([id number]) Visit Report for 05/02/2020 Chief Complaint Document Details Patient Name: Date of Service: [name] 05/02/2020 10:15 A M Medical Record Number: [id number] Patient Account Number: [id number] Date of Birth/Sex: Treating RN: April 22, 1951 (69 y.o. [name] Primary Care Provider: [name] Other Clinician: Referring Provider: Treating Provider/Extender: Doyle Askew, FRED Weeks in Treatment: [place] from: Patient Chief Complaint Bilateral reoccurring LE ulcers Electronic Signature(s) Signed: 05/02/2020 11:15:51 AM By: [name] PA-C Entered By: [name] on 05/02/2020 11:15:51 -------------------------------------------------------------------------------- Debridement Details Patient Name: Date of Service: [name]. 05/02/2020 10:15 A M Medical Record Number: [id number] Patient Account Number: [id number] Date of Birth/Sex: Treating RN: [birth year] (69 y.o. [name] Primary Care Provider: [name] Other Clinician: Referring Provider: Treating Provider/Extender: Doyle Askew, FRED Weeks in Treatment: [phone] Debridement Performed for Assessment: Wound #64 Left,Dorsal Foot Performed By: Physician [name], PA Debridement Type: Debridement Severity of Tissue Pre Debridement: Limited to breakdown of skin Level of Consciousness (Pre-procedure): Awake and Alert Pre-procedure Verification/Time Out Yes - 11:25 Taken: Start Time: 11:25 Pain Control: Other : benzocaine 20% spray T Area Debrided (L x W): otal 2.5 (cm) x 8 (cm) = 20 (cm) Tissue and other material debrided: Non-Viable, Callus, Skin: Epidermis, Fibrin/Exudate Level: Skin/Epidermis Debridement Description: Selective/Open Wound Instrument: Curette Bleeding: None Procedural Pain: 0 Post Procedural Pain: 0 Response to Treatment: Procedure was tolerated well Level of Consciousness (Post- Awake and  Alert procedure): Post Debridement Measurements of Total Wound Length: (cm) 7 Width: (cm) 6 Depth: (cm) 0.1 Volume: (cm) 3.299 Character of Wound/Ulcer Post Debridement: Requires Further Debridement Severity of Tissue Post Debridement: Limited to breakdown of skin Post Procedure Diagnosis Same as Pre-procedure Electronic Signature(s) Signed: 05/02/2020 5:04:51 PM By: [name] PA-C Signed: 05/03/2020 2:58:18 PM By: [name] RN, BSN Entered By: [name] on 05/02/2020 11:31:06 -------------------------------------------------------------------------------- Debridement Details Patient Name: Date of Service: [name]. 05/02/2020 10:15 A M Medical Record Number: [id number] Patient Account Number: [id number] Date of Birth/Sex: Treating RN: Feb 16, 1951 (69 y.o. [name] Primary Care Provider: [name] Other Clinician: Referring Provider: Treating Provider/Extender: Doyle Askew, FRED Weeks in Treatment: [phone] Debridement Performed for Assessment: Wound #67 Right,Medial Lower Leg Performed By: Physician [name], PA Debridement Type: Debridement Severity of Tissue Pre Debridement: Fat layer exposed Level of Consciousness (Pre-procedure): Awake and Alert Pre-procedure Verification/Time Out Yes - 11:25 Taken: Start Time: 11:30 Pain Control: Other : benzocaine 20% spray T Area Debrided (L x W): otal 3 (cm) x 1 (cm) = 3 (cm) Tissue and other material debrided: Non-Viable, Callus, Skin: Epidermis, Fibrin/Exudate Level: Skin/Epidermis Debridement Description: Selective/Open Wound Instrument: Curette Bleeding: None End Time: 11:35 Procedural Pain: 0 Post Procedural Pain: 0 Response to Treatment: Procedure was tolerated well Level of Consciousness (Post- Awake and Alert procedure): Post Debridement Measurements of Total Wound Length: (cm) 1.5 Width: (cm) 1.2 Depth: (cm) 0.1 Volume: (cm) 0.141 Character of Wound/Ulcer  Post Debridement: Requires Further Debridement Severity of Tissue Post Debridement: Fat layer exposed Post Procedure Diagnosis Same as Pre-procedure Electronic Signature(s) Signed: 05/02/2020 5:04:51 PM By: [name] PA-C Signed: 05/03/2020 2:58:18 PM By: [name] RN, BSN Entered By: [name] on 05/02/2020 11:35:11 -------------------------------------------------------------------------------- HPI Details Patient Name: Date of Service: [name]. 05/02/2020 10:15 A M Medical Record Number: [id number] Patient Account Number: [id number] Date of Birth/Sex: Treating RN: January 07, 1951 (69 y.o. [name] Primary Care Provider: Other Clinician: [name] Referring  Provider: Treating Provider/Extender: Doyle Askew, FRED Weeks in Treatment: 143 History of Present Illness HPI Description: this patient has been seen a couple of times before and returns with recurrent problems to her right and left lower extremity with swelling and weeping ulcerations due to not wearing her compression stockings which she had been advised to do during her last discharge, at the end of June 2018. During her last visit the patient had had normal arterial blood flow and her venous reflux study did not necessitate any surgical intervention. She was recommended compression and elevation and wound care. After prolonged treatment the patient was completely healed but she has been noncompliant with wearing or compressions.. She was here last week with an outpatient return visit planned but the patient came in a very poor general condition with altered mental status and was rushed to the ER on my request. With a history of hypertension, diabetes, TIA and right-sided weakness she was set up for an MRI on her brain and cervical spine and was sent to [facility]. Getting an MRI done was very difficult but once the workup was done she was found not to have any spinal stenosis,  epidural abscess or hematoma or discitis. This was radiculopathy to be treated as an outpatient and she was given a follow-up appointment. Today she is feeling much better alert and oriented and has come to reevaluate her bilateral lower extremity lymphedema and ulceration 03/25/2017 -- she was admitted to the hospital on 03/16/2017 and discharged on 03/18/2017 with left leg cellulitis and ulceration. She was started on vancomycin and Zosyn and x-ray showed no bony involvement. She was treated for a cellulitis with IV antibiotics changed to Rocephin and Flagyl and was discharged on oral Keflex and doxycycline to complete a 7 day course. Last hemoglobin A1c was 7.1 and her other ailments including hypertension got asthma were appropriately treated. 05/06/2017 -- she is awaiting the right size of compression stockings from [place] but other than that has been doing well. ====== Old notes 69 year old patient was seen one time last October and was lost to follow-up. She has recurrent problems with weeping and ulceration of her left lower extremity and has swelling of this for several years. It has been worse for the last 2 months. Past medical history is significant for diabetes mellitus type 2, hypertension, gout, morbid obesity, depressive disorders, hiatal hernia, migraines, status post knee surgery, risk of a cholecystectomy, vaginal hysterectomy and breast biopsy. She is not a smoker. As noted before she has never had a venous duplex study and an arterial ABI study was attempted but the left lower extremity was noncompressible 10/01/2016 -- had a lower extremity venous duplex reflux evaluation which showed no evidence of deep vein reflux in the right or left lower extremity, and no evidence of great saphenous vein reflux more than 500 ms in the right or left lower extremity, and the left small saphenous vein is incompetent but no vascular consult was recommended. review of her electronic medical  records noted that the ABI was checked in July 2017 where the right ABI was normal limits and the left ABI could not be ascertained due to pain with cuff pressure but the waveforms are within normal limits. her arterial duplex study scheduled for April 27. 10/08/2016 -- the patient has various reasons for not having a compression on and for the last 3 days she has had no compression on her left lower extremity either due to pain or the lack of  nursing help. She does not use her juxta lites either. 10/15/2016 -- the patient did not keep her appointment for arterial duplex study on April 27 and I have asked her to reschedule this. Her pain is out of proportion with the physical findings and she continuously fails to wear a compression wraps and cuts them off because she says she cannot tolerate the pain. She does not use her juxta lites either. 10/22/2016 -- he has rescheduled her arterial duplex study to May 21 and her pain today is a bit better. She has not been wearing her juxta lites on her right lower extremity but now understands that she needs to do this. She did tolerate the to press compression wrap on her left lower extremity 10/29/2016 --arterial duplex study is scheduled for next week and overall she has been tolerating her compression wraps and also using her juxta lites on her right lower extremity 11/05/2016 -- the right ABI was 0.95 the left was 1.03. The digit TBI is on the right was 0.83 on the left was 0.92 and she had biphasic flow through these vessels. The impression was that of normal lower extremity arterial study. 11/12/2016 -- her pain is minimal and she is doing very well overall. 11/26/2016 -- she has got juxta lites and her insurance will not pay for additional dual layer compression stockings. She is going to order some from [place]. 05/12/2017 -- her juxta lites are very old and too big for her and these have not been helping with compression. She did get 20-30 mm  compression stockings from [place] but she and her husband are unable to put these on. I believe she will benefit from bilateral Extremit-ease, compression stockings and we will measure her for these today. 05/20/2017 -- lymphedema on the left lower extremity has increased a lot and she has a open ulceration as a result of this. The right lower extremity is looking pretty good. She has decided to by the compression stockings herself and will get reimbursed by the home health, at a later date. 05/27/2017 -- her sciatica is bothering her a lot and she thought her left leg pain was caused due to the compression wrap and hence removed it and has significant lymphedema. There is no inflammation on this left lower extremity. 06/17/17 on evaluation today patient appears to be doing very well and in fact is completely healed in regard to her ulcerations. Unfortunately however she does have continued issues with lymphedema nonetheless. We did order compression garments for her unfortunately she states that the size that she received were large although we ordered medium. Obviously this means she is not getting the optimal compression. She does not have those with her today and therefore we could not confirm and contact the company on her behalf. Nonetheless she does state that she is going to have her husband bring them by tomorrow so that we can verify and then get in touch with the company. No fevers, chills, nausea, or vomiting noted at this time. Overall patient is doing better otherwise and I'm pleased with the progress she has made. 07/01/17 on evaluation today patient appears to be doing very well in regard to her bilateral lower extremity she does not have any openings at this point which is excellent news. Overall I'm pleased with how things have progressed up to this time. Since she is doing so well we did order her compression which we are seeing her today to ensure that it fits her properly and  everything is doing well in that regard and then subsequently she will be discharged. ============ Old Notes: 03/31/16 patient presents today for evaluation concerning open wounds that she has over the left medial ankle region as well as the left dorsal foot. She has previously had this occur although it has been healed for a number of months after having this for about a year prior until her hospitalization on 01/05/16. At that point in time it appears that she was admitted to the hospital for left lower extremity cellulitis and was placed on vancomycin and Zosyn at that point. Eventually upon discharge on January 15, 2016 she was placed on doxycycline at that point in time. Later on 03/27/16 positive wound culture growing Escherichia coli this was switched to amoxicillin. Currently she tells me that she is having pain radiated to be a 7 out of 10 which can be as high as 10 out of 10 with palpation and manipulation of the wound. This wound appears to be mainly venous in nature due to the bilateral lower extremity venous stasis/lymphedema. This is definitely much worse on her left than the right side. She does have type 1 diabetes mellitus, hypertension, morbid obesity, and is wheelchair dependent.during the course of the hospital stay a blood culture was also obtained and fortunately appeared negative. She also had an x-ray of the tibia/fibula on the left which showed no acute bone abnormality. Her white blood cell count which was performed last on 03/25/16 was 7.3, hemoglobin 12.8, protein 7.1, albumin 3.0. Her urine culture appeared to be negative for any specific organisms. Patient did have a left lower extremity venous duplex evaluation for DVT . This did not include venous reflux studies but fortunately was negative for DVT Patient also had arterial studies performed which revealed that she had a . normal ABI on the right though this was unable to be performed on the left secondary to pain that she  was having around the ankle region due to the wound. However it was stated on report that she had biphasic pulses and apparently good blood flow. ========== 06/03/17 she is here in follow-up evaluation for right lower extremity ulcer. The right lower sure he has healed but she has reopened to the left medial malleolus and dorsal foot with weeping. She is waiting for new compression garments to arrive from home health, the previous compression garments were ill fitting. We will continue with compression bilaterally and follow-up in 2 weeks Readmission: 08/05/17 on evaluation today patient appears to be doing somewhat poorly in regard to her left lower extremity especially although the right lower extremity has a small area which may no longer be open. She has been having a lot of drainage from the left lower extremity however he tells me that she has not been able to use the EXTREMIT-EASE Compression at this point. She states that she did better and was able to actually apply the Juxta-Lite compression although the wound that she has is too large and therefore really does not compress which is why she cannot wear it at this point. She has no one who can help her put it on regular basis her son can sometimes but he's not able to do it most of the time. I do believe that's why she has begun to weave and have issues as she is currently yet again. No fevers, chills, nausea, or vomiting noted at this time. Patient is no evidence of dementia. 08/12/17 on evaluation today patient appears to still be doing  fairly well in regard to the draining areas/weeping areas at this point. With that being said she unfortunately did go to the ER yesterday due to what was felt to be possibly a cellulitis. They place her on doxycycline by mouth and discharge her home. She definitely was not admitted. With that being said she states she has had more discomfort which has been unusual for her even compared to prior times and  she's had infections.08/12/17 on evaluation today patient appears to still be doing fairly well in regard to the draining areas/weeping areas at this point. With that being said she unfortunately did go to the ER yesterday due to what was felt to be possibly a cellulitis. They place her on doxycycline by mouth and discharge her home. She definitely was not admitted. With that being said she states she has had more discomfort which has been unusual for her even compared to prior times and she's had infections. 08/19/17 put evaluation today patient tells me that she's been having a lot of what sounds to be neuropathic type pain in regard to her left lower extremity. She has been using over-the-counter topical bins again which some believe. That in order to apply the she actually remove the wrap we put on her last Wednesday on Thursday. Subsequently she has not had anything on compression wise since that time. The good news is a lot of the weeping areas appear to have closed at this point again I believe she would do better with compression but we are struggling to get her to actually use what she needs to at this point. No fevers, chills, nausea, or vomiting noted at this time. 09/03/17 on evaluation today patient appears to be doing okay in regard to her lower extremities in regard to the lymphedema and weeping. Fortunately she does not seem to show any signs of infection at this point she does have a little bit of weeping occurring in the right medial malleolus area. With that being said this does not appear to be too significant which is good news. 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 She has severe skin damage secondary to both of these features involving the dorsal left foot and medial left ankle and lower leg. Still has open areas in the left anterior foot. The area on the right closed over. She uses her own juxta light  stockings. She does not have an arterial issue 09/16/17 on evaluation today patient actually appears to be doing excellent in regard to her bilateral lower extremity swelling. The Juxta-Lite compression wrap seem to be doing very well for her. She has not however been using the portion that goes over her foot. Her left foot still is draining a little bit not nearly as significant as it has been in the past but still I do believe that she likely needs to utilize the full wrap including the foot portion of this will improve as well. She also has been apparently putting on a significant amount of Vaseline which also think is not helpful for her. I recommended that if she feels she needs something for moisturizer Eucerin will probably be better. 09/30/17 on evaluation today patient presents with several new open areas in regard to her left lower extremity although these appear to be minimal and mainly seem to be more moisture breakdown than anything. Fortunately she does not seem to have any evidence of infection which is great news. She has been tolerating the  dressing changes without complication we are using silver alginate on the foot she has been using AB pads to have the legs and using her Juxta- Lite compression which seems to be controlling her swelling very well. Overall I'm pleased with the poor way she has progressed. 10/14/17 on evaluation today patient appears to be doing better in regard to her left lower extremity areas of weeping. She does still have some discomfort although in general this does not appear to be as macerated and I think it is progressing nicely. I do think she still needs to wear the foot portion of her Juxta- Lite in order to get the most benefit from the wrap obviously. She states she understands. Fortunately there does not appear to be evidence of infection at this time which is great news. 10/28/17 on evaluation today patient appears to be doing excellent in regard to her  left lower extremity. She has just a couple areas that are still open and seem to be causing any trouble whatsoever. For that reason I think that she is definitely headed in the right direction the spots are very tiny compared to what we have been dealing with in the past. 11/11/17 on evaluation today patient appears to have a right lateral lower extremity ulcer that has opened since I last saw her. She states this is where the home health nurse that was coming out remove the dressing without wetting the alginate first. Nonetheless I do not know if this is indeed the case or not but more importantly we have not ordered home help to be coming out for her wounds at all. I'm unsure as to why they are coming out and we're gonna have to check on this and get things situated in that regard. With that being said we currently really do not need them to be coming out as the patient has been taking care of her leg herself without complication and no issues. In fact she was doing much better prior to nursing coming out. 11/25/17 on evaluation today patient actually appears to be doing fairly well in regard to her left lower extremity swelling. In fact she has very little area of weeping at this point there's just a small spot on the lateral portion of her right leg that still has me just a little bit more concerned as far as wanting to see this clear up before I discharge her to caring for this at home. Nonetheless overall she has made excellent progress. 12/09/17 on evaluation today patient appears to be doing rather well in regard to her lower extremity edema. She does have some weeping still in the left lower extremity although the big area we were taking care of two weeks ago actually has closed and she has another area of weeping on the left lower extremity immediately as well is the top of her foot. She does not currently have lymphedema pumps she has been wearing her compression daily on a regular basis  as directed. With that being said I think she may benefit from lymphedema pumps. 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 We had a very difficult time getting and keeping this under control. 12/23/17 on evaluation today patient actually appears to be doing a little bit more poorly in regard to her bilateral lower extremities. She has been tolerating the Juxta-Lite compression wraps.  Unfortunately she has two new ulcers on the right lower extremity and left lower [place] ulceration seems to be larger. Obviously this is not good news. She has been tolerating the dressings without complication. 12/30/17 on evaluation today patient actually appears to be doing much better in regard to her bilateral lower extremity edema. She continues to have some issues with ulcerations and in fact there appears to be one spot on each leg where the wrap may have caused a little bit of a blister which is subsequently opened up at this point is given her pain. Fortunately it does not appear to be any evidence of infection which is good news. No fevers chills noted. 01/13/18 on evaluation today patient appears to be doing rather well in regard to her bilateral lower extremities. The dressings did get kind of stuck as far as the wound beds are concerned but again I think this is mainly due to the fact that she actually seems to be showing signs of healing which is good news. She's not having as much drainage therefore she was having more of the dressing sticking. Nonetheless overall I feel like her swelling is dramatically down compared to previous. 01/20/18 on evaluation today patient unfortunately though she's doing better in most regards has a large blister on the left anterior lower extremity where she is draining quite significantly. Subsequently this is going to need  debridement today in order to see what's underneath and ensure she does not continue to trapping fluid at this location. Nonetheless No fevers, chills, nausea, or vomiting noted at this time. 01/27/18 on evaluation today patient appears to be doing rather well at this point in regard to her right lower extremity there's just a very small area that she still has open at this point. With that being said I do believe that she is tolerating the compression wraps very well in making good progress. Home health is coming out at this point to see her. Her left lower extremity on the lateral portion is actually what still mainly open and causing her some discomfort for the most part 02/10/18 on evaluation today patient actually appears to be doing very well in regard to her right lower extremity were all the ulcers appear to be completely close. In regard to the left lower extremity she does have two areas still open and some leaking from the dorsal surface of her foot but this still seems to be doing much better to me in general. 02/24/18 on evaluation today patient actually appears to be doing much better in regard to her right lower extremity this is still completely healed. Her left lower extremity is also doing much better fortunately she has no evidence of infection. The one area that is gonna require some debridement is still on the left anterior shin. Fortunately this is not hurting her as badly today. 03/10/18 on evaluation today patient appears to be doing better in some regards although she has a little bit more open area on the dorsal foot and she also has some issues on the medial portion of the left lower extremity which is actually new and somewhat deep. With that being said there fortunately does not appear to be any significant signs of infection which is good news. No fevers, chills, nausea, or vomiting noted at this time. In general her swelling seems to be doing fairly well which is good  news. 03/31/18 on evaluation today patient presents for follow-up concerning her left lower extremity lymphedema. Unfortunately she has been  doing a little bit more poorly since I last saw her in regard to the amount of weeping that she is experiencing. She's also having some increased pain in the anterior shin location. Unfortunately I do not feel like the patient is making such good progress at this point a few weeks back she was definitely doing much better. 04/07/18 on evaluation today patient actually appears to be showing some signs of improvement as far as the left lower extremity is concerned. She has been tolerating the dressing changes and it does appear that the Drawtex did better for her. With that being said unfortunately home health is stating that they cannot obtain the Drawtex going forward. Nonetheless we're gonna have to check and see what they may be able to get the alginate they were using was getting stuck in causing new areas of skin being pulled all that with and subsequently weep and calls her to worsen overall this is the first time we've seen improvement at this time. 04/14/18 on evaluation today patient actually appears to be doing rather well at this point there does not appear to be any evidence of infection at this time and she is actually doing excellent in regard to the weeping in fact she almost has no openings remaining even compared to just last week this is a dramatic improvement. No fevers chills noted 04/21/18 evaluation today patient actually appears to be doing very well. She in fact is has a small area on the posterior lower extremity location and she has a small area on the dorsal surface of her foot that are still open both of which are very close to closing. We're hoping this will be close shortly. She brought her Juxta-Lite wrap with her today hoping that would be able to put her in it unfortunately I don't think were quite at that point yet but we're getting  closer. 04/28/18 upon evaluation today patient actually appears to be doing excellent in regard to her left lower extremity ulcer. In fact the region on the posterior lower extremity actually is much smaller than previously noted. Overall I'm very happy with the progress she has made. She again did bring her Juxta-Lite although we're not quite ready for that yet. 05/11/18 upon evaluation today patient actually appears to be doing in general fairly well in regard to her left lower Trinity. The swelling is very well controlled. With that being said she has a new area on the left anterior lower extremity as well as between the first and second toes of her left foot that was not present during the last evaluation. The region of her posterior left lower extremity actually appears to be almost completely healed. T be honest I'm very pleased with o the way that stands. Nonetheless I do believe that the lotion may be keeping the area to moist as far as her legs are concerned subsequently I'm gonna consider discontinuing that today. 05/26/18 on evaluation today patient appears to be doing rather well in regard to her left lower should be ulcers. In fact everything appears to be close except for a very small area on the left posterior lower extremity. Fortunately there does not appear to be any evidence of infection at this time. Overall very pleased with her progress. 06/02/18 and evaluation today patient actually appears to be doing very well in regard to her lower extremity ulcers. She has one small area that still continues to weep that I think may benefit her being able to justify lotion and  user Juxta-Lite wraps versus continued to wrap her. Nonetheless I think this is something we can definitely look into at this point. 06/23/18 on evaluation today patient unfortunately has openings of her bilateral lower extremities. In general she seems to be doing much worse than when I last saw her just as far as her  overall health standpoint is concerned. She states that her discomfort is mainly due to neuropathy she's not having any other issues otherwise. No fevers, chills, nausea, or vomiting noted at this time. 06/30/18 on evaluation today patient actually appears to be doing a little worse in regard to her right lower extremity her left lower extremity of doing fairly well. Fortunately there is no sign of infection at this time. She has been tolerating the dressing changes without complication. Home health did not come out like they were supposed to for the appropriate wrap changes. They stated that they never received the orders from [place] which were fax. Nonetheless we will send a copy of the orders with the patient today as well. 07/07/18 on evaluation today patient appears to be doing much better in regard to lower extremities. She still has several openings bilaterally although since I last saw her her legs did show obvious signs of infection when she later saw her nurse. Subsequently a culture was obtained and she is been placed on Bactrim and Keflex. Fortunately things seem to be looking much better it does appear she likely had an infection. Again last week we'd even discussed it but again there really was not any obvious sign that she had infection therefore we held off on the antibiotics. Nonetheless I'm glad she's doing better today. 07/14/18 on evaluation today patient appears to be doing much better regarding her bilateral lower Trinity's. In fact on the right lower for me there's nothing open at this point there are some dry skin areas at the sites where she had infection. Fortunately there is no evidence of systemic infection which is excellent news. No fevers chills noted 07/21/18 on evaluation today patient actually appears to be doing much better in regard to her left lower extremity ulcers. She is making good progress and overall I feel like she's improving each time I see her. She's having no pain  I do feel like the infection is completely resolved which is excellent news. No fevers, chills, nausea, or vomiting noted at this time. 07/28/18 on evaluation today patient appears to be doing very well in regard to her left lower [employer]. Everything seems to be showing signs of improvement which is excellent news. Overall very pleased with the progress that has been made. Fortunately there's no evidence of active infection at this time also excellent news. 08/04/18 on evaluation today patient appears to be doing more poorly in regard to her bilateral lower extremities. She has two new areas open up on the right and these were completely closed as of last week. She still has the two spots on the left which in my pinion seem to be doing better. Fortunately there's no evidence of infection again at this point. 08/11/18 on evaluation today patient actually appears to be doing very well in regard to her bilateral lower Trinity wounds that all seem to be doing better and are measures smaller today. Fortunately there's no signs of infection. No fevers, chills, nausea, or vomiting noted at this time. 08/18/18 on evaluation today patient actually appears to be doing about the same inverter bilateral lower extremities. She continues to have areas that blistering  open as was drain that fortunately nothing too significant. Overall I feel like Drawtex may have done better for her however compared to the collagen. 08/25/18 on evaluation today patient appears to be doing a little bit more poorly today even compared to last time I saw her. Again I'm not exactly sure why she's making worse progress over the past several weeks. I'm beginning to wonder if there is some kind of underlying low level infection causing this issue. I did actually take a culture from the left anterior lower extremity but it was a new wound draining quite a bit at this point. Unfortunately she also seems to be having more pain which is what  also makes me worried about the possibility of infection. This is despite never erythema noted at this point. 09/01/18 on evaluation today patient actually appears to be doing a little worse even compared to last week in regard to bilateral lower extremities. She did go to the hospital on the 16th was given a dose of IV Zosyn and then discharged with a recommendation to continue with the Bactrim that I previously prescribed for her. Nonetheless she is still having a lot of discomfort she tells me as well at this time. This is definitely unfortunate. No fevers, chills, nausea, or vomiting noted at this time. 09/08/18 on evaluation today patient's bilateral lower extremities actually appear to be shown signs of improvement which is good news. Fortunately there does not appear to be any signs of active infection I think the anabiotic is helping in this regard. Overall I'm very pleased with how she is progressing. 09/15/18 patient was actually seen in ER yesterday due to her legs as well unfortunately. She states she's been having a lot of pain and discomfort as well as a lot of drainage. Upon inspection today the patient does have a lot of swelling and drainage I feel like this is more related to lymphedema and poor fluid control than it is to infection based on what I'm seeing. The physician in the emergency department also doubted that the patient was having a significant infection nonetheless I see no evidence of infection obvious at this point although I do see evidence of poor fluid control. She still not using a compression pumps, she is not elevating due to her lift chair as well as her hospital bed being broken, and she really is not keeping her legs up as much as they should be and also has been taking off her wraps. All this combined I think has led to poor fluid control and to be honest she may be somewhat volume overloaded in general as well. I recommend that she may need to contact your physician  to see if a prescription for a diuretic would be beneficial in their opinion. As long as this is safe I think it would likely help her. 09/29/18 on evaluation today patient's left lower extremity actually appears to be doing quite a bit better. At least compared to last time that I saw her. She still has a large area where she is draining from but there's a lot of new skin speckled trout and in fact there's more new skin that there are open areas of weeping and drainage at this point. This is good news. With regard to the right lower extremity this is doing much better with the only open area that I really see being a dry spot on the right lateral ankle currently. Fortunately there's no signs of active infection at this time  which is good news. No fevers, chills, nausea, or vomiting noted at this time. The patient seems somewhat stressed and overwhelmed during the visit today she was very lethargic as such. She does and she is not taking any pain medications at this point. Apparently according to her husband are also in the process of moving which is probably taking its toll on her as well. 10/06/18 on evaluation today patient appears to be doing rather well in regard to her lower extremities compared to last evaluation. Fortunately there's no signs of active infection. She tells me she did have an appointment with her primary. Nonetheless he was concerned that the wounds were somewhat deep based on pictures but we never actually saw her legs. She states that he had her somewhat worried due to the fact that she was fearing now that she was [place] have to have an amputation. With that being said based on what I'm seeing check she looks better this week that she has the last two times I've seen her with much less drainage I'm actually pleased in this regard. That doesn't mean that she's out of the water but again I do not think what the point of talking about education at all in regard to her leg. She is  very happy to hear this. She is also not having as much pain as she was having last week. 10/13/18 unfortunately on evaluation today patient still continues to have a significant amount of drainage she's not letting home health actually apply the compression dressings at this point. She's trying to use of Juxta-Lite of the top of Kerlex and the second layer of the three layer compression wrap. With that being said she just does not seem to be making as good a progress as I would expect if she was having the compression applied and in place on a regular basis. No fevers, chills, nausea, or vomiting noted at this time. 10/20/18 on evaluation today patient appears to be doing a little better in regard to her bilateral lower extremity ulcers. In fact the right lower extremity seems to be healed she doesn't even have any openings at this point left lower extremity though still somewhat macerated seems to be showing signs of new skin growth at multiple locations throughout. Fortunately there's no evidence of active infection at this time. No fevers, chills, nausea, or vomiting noted at this time. 10/27/18 on evaluation today patient appears to be doing much better in regard to her left lower Trinity ulcer. She's been tolerating the laptop complication and has minimal drainage noted at this point. Fortunately there's no signs of active infection at this time. No fevers, chills, nausea, or vomiting noted at this time. 11/03/18 on evaluation today patient actually appears to be doing excellent in regard to her left lower extremity. She is having very little drainage at this point there does not appear to be any significant signs of infection overall very pleased with how things have gone. She is likewise extremely pleased still and seems to be making wonderful progress week to week. I do believe antibiotics were helpful for her. Her primary care provider did place on amateur clean since I last saw her. 11/17/18 on  evaluation today patient appears to be doing worse in regard to her bilateral lower extremities at this point. She is been tolerating the dressing changes without complication. With that being said she typically takes the Coban off fairly quickly upon arriving home even after being seen here in the clinic and  does not allow home health reapply command as part of the dressing at home. Therefore she said no compression essentially since I last saw her as best I can tell. With that being said I think it shows and how much swelling she has in the open wounds that are noted at this point. Fortunately there's no signs of infection but unfortunately if she doesn't get this under control I think she will end up with infection and more significant issues. 11/24/18 on evaluation today patient actually appears to be doing somewhat better in regard to her bilateral lower extremities. 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 Nonetheless that is doing better currently so she should be able to attempt reinitiating the lymphedema pumps at this time. No fevers, chills, nausea, or vomiting noted at this time. 12/01/18 upon evaluation today patient's left lower extremity appears to be doing quite well unfortunately her right lower extremity is not doing nearly as well. She has been tolerating the dressing changes without complication unfortunately she did not keep a wrap on the right at this time. Nonetheless I believe this has led to increased swelling and weeping in the world is actually much larger than during the last evaluation with her. 12/08/18 on evaluation today patient appears to be doing about the same at this point in regard to her right lower extremity. There is some more palatable to touch I'm concerned about the possibility of there being some infection although I  think the main issue is she's not keeping her compression wrap on which in turn is not allowing this area to heal appropriately. 12/22/18 on evaluation today patient appears to be doing better in regard to left lower extremity unfortunately significantly worse in regard to the right lower extremity. The areas of blistering and necrotic superficial tissue have spread and again this does not really appear to be signs of infection and all she just doesn't seem to be doing nearly as well is what she has been in the past. Overall I feel like the Augmentin did absolutely nothing for her she doesn't seem to have any infection again I really didn't think so last time either is more of a potential preventative measure and hoping that this would make some difference but I think the main issue is she's not wearing her compression. She tells me she cannot wear the [place] been we put on she takes it off pretty much upon getting home. Subsequently she worshiped Juxta-Lite when I questioned her about how often she wears it this is no more than three hours a day obviously that leaves 21 hours that she has no compression and this is obviously not doing well for her. Overall I'm concerned that if things continue to worsen she is at great risk of both infection as well as losing her leg. 01/05/19 on evaluation today patient appears to be doing well in regard to her left lower extremity which he is allowing [place] to wrap and not so well with regard to her right lower extremity which she is not allowing [place] to really wrap and keep the wrap on. She states that it hurts too badly whenever it's wrapped and she ends up having to take it off. She's been using the Juxta-Lite she tells me up to six hours a day although I question whether  or not that's really been the case to be honest. Previously she told me three hours today nonetheless obviously the legs as long as the wrap is doing great when she is not is doing much more  poorly. 01/12/2019 on evaluation today patient actually appears to be doing a little better in my opinion with regard to her right lower extremity ulcer. She has a small open area on the left lower extremity unfortunately but again this I think is part of the normal fluctuation of what she is going to have to expect with regard to her legs especially when she is not using her lymphedema pumps on a regular basis. Subsequently based on what I am seeing today I think that she does seem to be doing slightly better with regard to her right lower extremity she did see her primary care provider on Monday they felt she had an infection and placed her on 2 antibiotics. Both Cipro and clindamycin. Subsequently again she seems possibly to be doing a little bit better in regards to the right lower extremity she also tells me however she has been wearing the compression wrap over the past week since I spoke with her as well that is a Kerlix and Coban wrap on the right. No fevers, chills, nausea, vomiting, or diarrhea. 01/19/2019 on evaluation today patient appears to be doing better with regard to her bilateral lower extremities especially the right. I feel like the compression has been beneficial for her which is great news. She did get a call from her primary care provider on her way here today telling her that she did have methicillin- resistant Staphylococcus aureus and he was calling in a couple new antibiotics for her including a ointment to be applied she tells me 3 times a day. With that being said this sounds like likely to be Bactroban which I think could be applied with each dressing/wrap change but I would not be able to accommodate her applying this 3 times a day. She is in agreement with the least doing this we will add that to her orders today. 01/26/2019 on evaluation today patient actually appears to be doing much better with regard to her right lower extremity. Her left lower extremity is also  doing quite well all things considering. Fortunately there is no evidence of active infection at this time. No fevers, chills, nausea, vomiting, or diarrhea. 02/02/2019 on evaluation today patient appears to be doing much better compared to her last evaluation. Little by little off like her right leg is returning more towards normal. There does not appear to be any signs of active infection and overall she seems to be doing quite well which is great news. I am very pleased in this regard. No fevers, chills, nausea, vomiting, or diarrhea. 02/09/2019 upon evaluation today patient appears to be doing better with regard to her bilateral lower extremities. She has been tolerating the dressing changes without complication. Fortunately there is no signs of active infection at this time. No fevers, chills, nausea, vomiting, or diarrhea. 02/23/2019 on evaluation today patient actually appears to be doing quite well with regard to her bilateral lower extremities. She has been tolerating the dressing changes without complication. She is even used her pumps one time and states that she really felt like it felt good. With that being said she seems to be in good spirits and her legs appear to be doing excellent. 03/09/2019 on evaluation today patient appears to be doing well with regard to her right  lower extremity there are no open wounds at this time she is having some discomfort but I feel like this is more neuropathy than anything. With regard to her left lower extremity she had several areas scattered around that she does have some weeping and drainage from but again overall she does not appear to be having any significant issues and no evidence of infection at this time which is good news. 03/23/2019 on evaluation today patient appears to be doing well with regard to her right lower extremity which she tells me is still close she is using her juxta light here. Her left lower extremity she mainly just has an area on  the foot which is still slightly draining although this also is doing great. Overall very pleased at this time. 04/06/2019 patient appears to be doing a little bit worse in regard to her left lower extremity upon evaluation today. She feels like this could be becoming infected again which she had issues with previous. Fortunately there is no signs of systemic infection but again this is always a struggle with her with her legs she will go from doing well to not so well in a very short amount of time. 04/20/2019 on evaluation today patient actually appears to be doing quite well with regard to her right lower extremity I do not see any signs of active infection at this time. Fortunately there is no fever chills noted. She is still taking the antibiotics which I prescribed for her at this point. In regard to the left lower extremity I do feel like some of these areas are better although again she still is having weeping from several locations at this time. 04/27/2019 on evaluation today patient appears to be doing about the same if not slightly worse in regard to her left lower extremity ulcers. She tells me when questioned that she has been sleeping in her Hoveround chair in fact she tells me she falls asleep without even knowing it. I think she is spending a whole lot of time in the chair and less time walking and moving around which is not good for her legs either. On top of that she is in a seated position which is also the worst position she is not really elevating her legs and she is also not using her lymphedema pumps. All this is good to contribute to worsening of her condition in general. 05/18/2019 on evaluation today patient appears to be doing well with regard to her lower extremity on the right in fact this is showing no signs of any open wounds at this time. On the left she is continuing to have issues with areas that do drain. Some of the regions have healed and there are couple areas  that have reopened. She did go to the ER per the patient according to recommendations from the home health nurse due to what she was seen when she came out on 05/13/2019. Subsequently she felt like the patient needed to go to the hospital due to the fact that again she was having "milky white discharge" from her leg. Nonetheless she had and then was placed on doxycycline and subsequently seems to be doing better. 06/01/2019 upon evaluation today patient appears to be doing really in my opinion about the same. I do not see any signs of active infection which is good news. Overall she still has wounds over the bilateral lower extremities she has reopened on the right but this appears to be more of a crack where  there is weeping/edema coming from the region. I do not see any evidence of infection at either site based on what I visualized today. 07/13/2019 upon evaluation today patient appears to be doing a little worse compared to last time I saw her. She since has been in the hospital from 06/21/2019 through 06/29/2019. This was secondary to having Covid. During that time they did apply lotion to her legs which unfortunately has caused her to develop a myriad of open wounds on her lower extremities. Her legs do appear to be doing better as far as the overall appearance is concerned but nonetheless she does have more open and weeping areas. 07/27/2019 upon evaluation today patient appears to be doing more poorly to be honest in regard to her left lower extremity in particular. There is no signs of systemic infection although I do believe she may have local infection. She notes she has been having a lot of blue/green drainage which is consistent potentially with Pseudomonas. That may be something that we need to consider here as well. The doxycycline does not seem to have been helping. 08/03/2019 upon evaluation today patient appears to be doing a little better in my opinion compared to last week's  evaluation. Her culture I did review today and she is on appropriate medications to help treat the Enterobacter that was noted. Overall I feel like that is good news. With that being said she is unfortunately continuing to have a lot of drainage and though it is doing better I still think she has a long ways to go to get things dried up in general. Fortunately there is no signs of systemic infection. 08/10/2019 upon evaluation today patient appears to be doing may be slightly better in regard to her left lower extremity the right lower extremity is doing much better. Fortunately there is no signs of infection right now which is good news. No fevers, chills, nausea, vomiting, or diarrhea. 08/24/2019 on evaluation today patient appears to be doing slightly better in regard to her lower extremities. The left lower extremity seems to be healed the right lower extremity is doing better though not completely healed as far as the openings are concerned. She has some generalized issues here with edema and weeping secondary to her lymphedema though again I do believe this is little bit drier compared to prior weeks evaluations. In general I am very pleased with how things seem to be progressing. No fevers, chills, nausea, vomiting, or diarrhea. 08/31/2019 upon evaluation today patient actually seems to making some progress here with regard to the left lower extremity in particular. She has been tolerating the dressing changes without complication. Fortunately there is no signs of active infection at this time. No fevers, chills, nausea, vomiting, or diarrhea. She did see Dr. Doren Custard and he did note that she did have a issue with the left great saphenous vein and the small saphenous vein in the leg. With that being said he was concerned about the possibility of laser ablation not being extremely successful. He also mentioned a small risk of DVT associated with the procedure. However if the wounds do not continue to  improve he stated that that would probably be the way to go. Fortunately the patient's legs do seem to be doing much better. 09/07/2019 upon evaluation today patient appears to be doing better with regard to her lower extremities. She has been tolerating the dressing changes without complication. With that being said she is showing signs of improvement and overall very pleased.  There are some areas on her leg that I think we do need to debride we discussed this last week the patient is in agreement with doing that as long as it does not hurt too badly. 09/14/2019 upon evaluation today patient appears to be doing decently well with regard to her left lower extremity. She is not having near as much weeping as she has had in the past things seem to be drying up which is good news. There is no signs of active infection at this time. 09/21/19 upon evaluation today patient appears to be doing better in regard overall to her bilateral lower extremities. She again has less open than she did previous and each week I feel like this is getting better. Fortunately there is no signs of active infection at this time. No fevers, chills, nausea, vomiting, or diarrhea. 09/28/2019 upon evaluation today patient actually appears to be showing signs of improvement with regard to her left lower extremity. Unfortunately the right medial lower extremity around the ankle region has reopened to some degree but this appears to be minimal still which is good news. There is no signs of active infection at this time which is also good news. 10/12/2019 upon evaluation today patient appears to be doing okay with regard to her bilateral lower extremities today. The right is a little bit worse then last evaluation 2 weeks ago. The left is actually doing a little better in my opinion. Overall there is no signs of active infection at this time that I see. Obviously that something we have to keep a close eye on she is very prone to this with  the significant and multiple openings that she has over the bilateral lower extremities. 10/19/2019 upon evaluation today patient appears to be doing about the best that I have seen her in quite some time. She has been tolerating the dressing changes without complication. There does not appear to be any signs of active infection and overall I am extremely happy with the way her legs appeared. She is drying up quite nicely and overall is having less pain. 11/09/2019 upon evaluation today patient appears to be doing better in regard to her wounds. She seems to be drying up more and more each time I see her this is just taking a very long time. Fortunately there is no signs of active infection at this time. 11/23/2019 upon evaluation today patient actually appears to be doing excellent in regard to her lower extremities at this point compared to where she has been. Fortunately there is no signs of active infection at this time. She did go to the hospital last week for nausea and vomiting completely unrelated to her wounds. Fortunately she is doing better she was given some Reglan and got better. She had associated abdominal pain but they never found out what was going on. 12/07/2019 upon evaluation today patient appears to be doing well for the most part in regard to her legs. She unfortunately has not been keeping the Coban portion of her wraps on therefore the compression has not really been sufficient for what it is supposed to be. Nonetheless she tells me that it just hurt too bad therefore she removed it. 12/21/2019 upon evaluation today patient actually appears to be doing quite well with regard to her legs. I do feel like she has been making progress which is great news and overall there is no signs of active infection at this time. No fevers, chills, nausea, vomiting, or diarrhea. 01/04/2020 upon  evaluation today patient presents for follow-up concerning her lower extremity edema bilaterally. She still  has open wounds she has not been using her lymphedema pumps. She is also not been utilizing her compression wraps appropriately she tends to unwrap them, take them off, or states that they hurt. Obviously the reason they hurt is because her legs start to swell but the issue is if she would use her compression/lymphedema pumps regularly she would not swell and she would have the pain. Nonetheless she has not even picked them up once honestly over the past several months and may be even as much as in the past year based on my opinion and what have seen. She tells me today that after last week when I talked about this with her specifically actually that was 2 weeks ago that she "forgot". 8//21 on evaluation today patient appears to be doing a little better in regard to her legs bilaterally. Fortunately there is no signs of active infection at this time. She tells me that she used her lymphedema pumps all of one time over the past 2 weeks since I last saw her. She tells me that she has been too busy in order to continue to use these. 02/01/2020 on evaluation today patient appears to be doing some better in regard to her wounds in general in her legs. We felt the right was healed although is not completely it does appear to be doing better she tells me she has been using her lymphedema pumps that she has had this six times since I last saw her. Obviously the more she does that the better she would do my opinion 02/15/2020 upon evaluation today patient appears to be doing about the same in regard to her legs. She tells me that she is pumping I'm still not sure how much she does to be perfectly honest. However even if she does a little bit here and there I guess that is better than nothing. Fortunately there is no sign of active infection at this time which is great news. No fevers, chills, nausea, vomiting, or diarrhea. 02/29/2020 on evaluation today patient actually appears to be doing quite well all things  considered this week. She has been tolerating the dressing changes without complication. Fortunately there is no signs of active infection at this time. No fevers, chills, nausea, vomiting, or diarrhea. 03/14/2020 upon evaluation today patient appears to be doing really about the same in regard to her legs. There is no signs of improvement overall and she as noted from home health does not appear to be elevating her legs he can get into her lift chair. There is too much stuff piled up on it the patient tells me. She also tells me she cannot really use her pumps effectively due to the fact that she cannot have any space to get them on. Finally she is also not really elevating her legs because she is not sleeping in her bed she is sleeping in her chair currently and again overall I think everything that she is done in combination has been exactly the wrong thing for what she needs for her legs. 04/04/2020 upon evaluation today patient appears to be doing well at this time with regard to her legs. She is actually been pumping, keeping her wraps on, and to be honest she seems to be doing dramatically better the right leg is excellent the left leg is also excellent and measuring much smaller than previous. 04/18/2020 upon evaluation today patient actually is  continue to make good progress in regard to her lower extremities bilaterally. Everything is improving and less wet that has been in the past overall I am extremely pleased with where things stand and I think that she is making great progress. The patient tells me she still continue to use her compression pumps 05/02/2020 on evaluation today patient appears to be doing well at this time in regard to her left leg which is showing signs of drying up. With that being said she does have a lot of lymphedema type crusty skin around the toes of her left foot and the right medial ankle which has opened at this point. Fortunately there is no signs of active  infection systemically at this point or even locally for that matter. Electronic Signature(s) Signed: 05/02/2020 11:36:50 AM By: [name] PA-C Entered By: [name] on 05/02/2020 11:36:49 -------------------------------------------------------------------------------- Physical Exam Details Patient Name: Date of Service: [name], [name] 05/02/2020 10:15 A M Medical Record Number: [id number] Patient Account Number: [id number] Date of Birth/Sex: Treating RN: 1951-02-20 (69 y.o. [name] Primary Care Provider: [name] Other Clinician: Referring Provider: Treating Provider/Extender: Doyle Askew, FRED Weeks in Treatment: 143 Constitutional Obese and well-hydrated in no acute distress. Respiratory normal breathing without difficulty. Psychiatric this patient is able to make decisions and demonstrates good insight into disease process. Alert and Oriented x 3. pleasant and cooperative. Notes Wound bed currently showed signs of good granulation epithelization a lot of areas especially on the left lower extremity. With that being said I did actually perform debridement to clear away some of the thickened and fibrotic tissue overlying the toes and edges of the wound on the left foot and right medial ankle/lower leg region. She tolerated all this today without complication post debridement wound bed appeared to be doing much better in general. Electronic Signature(s) Signed: 05/02/2020 11:37:27 AM By: [name] PA-C Entered By: [name] on 05/02/2020 11:37:26 -------------------------------------------------------------------------------- Physician Orders Details Patient Name: Date of Service: [name]. 05/02/2020 10:15 A M Medical Record Number: [id number] Patient Account Number: [id number] Date of Birth/Sex: Treating RN: August 08, 1950 (69 y.o. [name] Primary Care Provider: [name] Other Clinician: Referring  Provider: Treating Provider/Extender: Doyle Askew, FRED Weeks in Treatment: [phone] Verbal / Phone Orders: No Diagnosis Coding ICD-10 Coding Code Description E11.622 Type 2 diabetes mellitus with other skin ulcer I89.0 Lymphedema, not elsewhere classified I87.331 Chronic venous hypertension (idiopathic) with ulcer and inflammation of right lower extremity I87.332 Chronic venous hypertension (idiopathic) with ulcer and inflammation of left lower extremity L97.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 leg with fat layer exposed L97.822 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 I10 Essential (primary) hypertension E66.01 Morbid (severe) obesity due to excess calories F41.8 Other specified anxiety disorders R53.1 Weakness Follow-up Appointments Return appointment in 3 weeks. Dressing Change Frequency Wound #61 Left,Circumferential Lower Leg Other: - 2 times per week Wound #64 Left,Dorsal Foot Other: - 2 times per week Wound #67 Right,Medial Lower Leg Other: - 2 times per week Skin Barriers/Peri-Wound Care Moisturizing lotion - to dry skin Wound Cleansing Clean wound with Wound Cleanser - wash legs with soap and water with dressing changes May shower with protection. Primary Wound Dressing Wound #61 Left,Circumferential Lower Leg Calcium Alginate with Silver Wound #67 Right,Medial Lower Leg Calcium Alginate with Silver Wound #  64 Left,Dorsal Foot Calcium Alginate with Silver Secondary Dressing Wound #61 Left,Circumferential Lower Leg Dry Gauze ABD pad - as needed Wound #64 Left,Dorsal Foot Dry Gauze ABD pad - as needed Edema Control 3 Layer Compression System - Bilateral void standing for long periods of time - walking is encouraged A Elevate legs to the level of the heart or above for 30 minutes daily and/or when sitting, a  frequency of: - do not sleep in chair with feet dangling, MUST elevate legs in recliner while sitting Exercise regularly Segmental Compressive Device. - lymphedema pumps 60 minutes 1- 2 times per day Off-Loading Turn and reposition every 2 hours Additional Orders / Instructions Follow Nutritious Diet - T include vitamin A, vitamin C, and Zinc along with increased protein intake. o [facility] for wound care. - [facility] Electronic Signature(s) Signed: 05/02/2020 5:04:51 PM By: [name] PA-C Signed: 05/03/2020 2:58:18 PM By: [name] RN, BSN Entered By: [name] on 05/02/2020 11:34:25 -------------------------------------------------------------------------------- Problem List Details Patient Name: Date of Service: [name]. 05/02/2020 10:15 A M Medical Record Number: [id number] Patient Account Number: [id number] Date of Birth/Sex: Treating RN: November 30, 1950 (69 y.o. [name] Primary Care Provider: [name] Other Clinician: Referring Provider: Treating Provider/Extender: Doyle Askew, FRED Weeks in Treatment: [phone] Active Problems ICD-10 Encounter Code Description Active Date MDM Diagnosis E11.622 Type 2 diabetes mellitus with other skin ulcer 08/05/2017 No Yes I89.0 Lymphedema, not elsewhere classified 08/05/2017 No Yes I87.331 Chronic venous hypertension (idiopathic) with ulcer and inflammation of right 08/05/2017 No Yes lower extremity I87.332 Chronic venous hypertension (idiopathic) with ulcer and inflammation of left 08/05/2017 No Yes lower extremity L97.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 leg with fat layer 08/05/2017 No Yes exposed L97.822 Non-pressure chronic ulcer of other part of left lower leg with fat layer exposed2/20/2019 No Yes L97.522 Non-pressure chronic ulcer of other part of left foot with fat layer exposed 06/01/2019 No Yes I10 Essential (primary)  hypertension 08/05/2017 No Yes E66.01 Morbid (severe) obesity due to excess calories 08/05/2017 No Yes F41.8 Other specified anxiety disorders 08/05/2017 No Yes R53.1 Weakness 08/05/2017 No Yes Inactive Problems Resolved Problems Electronic Signature(s) Signed: 05/02/2020 11:15:43 AM By: [name] PA-C Entered By: [name] on 05/02/2020 11:15:41 -------------------------------------------------------------------------------- Progress Note Details Patient Name: Date of Service: [name]. 05/02/2020 10:15 A M Medical Record Number: [id number] Patient Account Number: [id number] Date of Birth/Sex: Treating RN: Jun 11, 1951 (69 y.o. [name] Primary Care Provider: [name] Other Clinician: Referring Provider: Treating Provider/Extender: Doyle Askew, FRED Weeks in Treatment: [phone] Subjective Chief Complaint Information obtained from Patient Bilateral reoccurring LE ulcers History of Present Illness (HPI) this patient has been seen a couple of times before and returns with recurrent problems to her right and left lower extremity with swelling and weeping ulcerations due to not wearing her compression stockings which she had been advised to do during her last discharge, at the end of June 2018. During her last visit the patient had had normal arterial blood flow and her venous reflux study did not necessitate any surgical intervention. She was recommended compression and elevation and wound care. After prolonged treatment the patient was completely healed but she has been noncompliant with wearing or compressions.. She was here last week with an outpatient return visit planned but the patient came in a very poor general condition with altered mental status and was rushed to the ER on my  request. With a history of hypertension, diabetes, TIA and right-sided weakness she was set up for an MRI on her brain and cervical spine and was sent to [facility]. Getting an MRI done was very difficult but once the workup was done she was found not to have any spinal stenosis, epidural abscess or hematoma or discitis. This was radiculopathy to be treated as an outpatient and she was given a follow-up appointment. Today she is feeling much better alert and oriented and has come to reevaluate her bilateral lower extremity lymphedema and ulceration 03/25/2017 -- she was admitted to the hospital on 03/16/2017 and discharged on 03/18/2017 with left leg cellulitis and ulceration. She was started on vancomycin and Zosyn and x-ray showed no bony involvement. She was treated for a cellulitis with IV antibiotics changed to Rocephin and Flagyl and was discharged on oral Keflex and doxycycline to complete a 7 day course. Last hemoglobin A1c was 7.1 and her other ailments including hypertension got asthma were appropriately treated. 05/06/2017 -- she is awaiting the right size of compression stockings from [place] but other than that has been doing well. ====== Old notes 69 year old patient was seen one time last October and was lost to follow-up. She has recurrent problems with weeping and ulceration of her left lower extremity and has swelling of this for several years. It has been worse for the last 2 months. Past medical history is significant for diabetes mellitus type 2, hypertension, gout, morbid obesity, depressive disorders, hiatal hernia, migraines, status post knee surgery, risk of a cholecystectomy, vaginal hysterectomy and breast biopsy. She is not a smoker. As noted before she has never had a venous duplex study and an arterial ABI study was attempted but the left lower extremity was noncompressible 10/01/2016 -- had a lower extremity venous duplex reflux evaluation which showed no evidence of deep vein reflux in the right or left lower extremity, and no evidence of great saphenous vein reflux more than 500 ms in the right or left lower extremity, and  the left small saphenous vein is incompetent but no vascular consult was recommended. review of her electronic medical records noted that the ABI was checked in July 2017 where the right ABI was normal limits and the left ABI could not be ascertained due to pain with cuff pressure but the waveforms are within normal limits. her arterial duplex study scheduled for April 27. 10/08/2016 -- the patient has various reasons for not having a compression on and for the last 3 days she has had no compression on her left lower extremity either due to pain or the lack of nursing help. She does not use her juxta lites either. 10/15/2016 -- the patient did not keep her appointment for arterial duplex study on April 27 and I have asked her to reschedule this. Her pain is out of proportion with the physical findings and she continuously fails to wear a compression wraps and cuts them off because she says she cannot tolerate the pain. She does not use her juxta lites either. 10/22/2016 -- he has rescheduled her arterial duplex study to May 21 and her pain today is a bit better. She has not been wearing her juxta lites on her right lower extremity but now understands that she needs to do this. She did tolerate the to press compression wrap on her left lower extremity 10/29/2016 --arterial duplex study is scheduled for next week and overall she has been tolerating her compression wraps and also using her juxta  lites on her right lower extremity 11/05/2016 -- the right ABI was 0.95 the left was 1.03. The digit TBI is on the right was 0.83 on the left was 0.92 and she had biphasic flow through these vessels. The impression was that of normal lower extremity arterial study. 11/12/2016 -- her pain is minimal and she is doing very well overall. 11/26/2016 -- she has got juxta lites and her insurance will not pay for additional dual layer compression stockings. She is going to order some from [place]. 05/12/2017 -- her  juxta lites are very old and too big for her and these have not been helping with compression. She did get 20-30 mm compression stockings from [facility] but she and her husband are unable to put these on. I believe she will benefit from bilateral Extremit-ease, compression stockings and we will measure her for these today. 05/20/2017 -- lymphedema on the left lower extremity has increased a lot and she has a open ulceration as a result of this. The right lower extremity is looking pretty good. She has decided to by the compression stockings herself and will get reimbursed by the home health, at a later date. 05/27/2017 -- her sciatica is bothering her a lot and she thought her left leg pain was caused due to the compression wrap and hence removed it and has significant lymphedema. There is no inflammation on this left lower extremity. 06/17/17 on evaluation today patient appears to be doing very well and in fact is completely healed in regard to her ulcerations. Unfortunately however she does have continued issues with lymphedema nonetheless. We did order compression garments for her unfortunately she states that the size that she received were large although we ordered medium. Obviously this means she is not getting the optimal compression. She does not have those with her today and therefore we could not confirm and contact the company on her behalf. Nonetheless she does state that she is going to have her husband bring them by tomorrow so that we can verify and then get in touch with the company. No fevers, chills, nausea, or vomiting noted at this time. Overall patient is doing better otherwise and I'm pleased with the progress she has made. 07/01/17 on evaluation today patient appears to be doing very well in regard to her bilateral lower extremity she does not have any openings at this point which is excellent news. Overall I'm pleased with how things have progressed up to this time. Since she is  doing so well we did order her compression which we are seeing her today to ensure that it fits her properly and everything is doing well in that regard and then subsequently she will be discharged. ============ Old Notes: 03/31/16 patient presents today for evaluation concerning open wounds that she has over the left medial ankle region as well as the left dorsal foot. She has previously had this occur although it has been healed for a number of months after having this for about a year prior until her hospitalization on 01/05/16. At that point in time it appears that she was admitted to the hospital for left lower extremity cellulitis and was placed on vancomycin and Zosyn at that point. Eventually upon discharge on January 15, 2016 she was placed on doxycycline at that point in time. Later on 03/27/16 positive wound culture growing Escherichia coli this was switched to amoxicillin. Currently she tells me that she is having pain radiated to be a 7 out of 10 which can be  as high as 10 out of 10 with palpation and manipulation of the wound. This wound appears to be mainly venous in nature due to the bilateral lower extremity venous stasis/lymphedema. This is definitely much worse on her left than the right side. She does have type 1 diabetes mellitus, hypertension, morbid obesity, and is wheelchair dependent.during the course of the hospital stay a blood culture was also obtained and fortunately appeared negative. She also had an x-ray of the tibia/fibula on the left which showed no acute bone abnormality. Her white blood cell count which was performed last on 03/25/16 was 7.3, hemoglobin 12.8, protein 7.1, albumin 3.0. Her urine culture appeared to be negative for any specific organisms. Patient did have a left lower extremity venous duplex evaluation for DVT . This did not include venous reflux studies but fortunately was negative for DVT Patient also had arterial studies performed which revealed that  she had a . normal ABI on the right though this was unable to be performed on the left secondary to pain that she was having around the ankle region due to the wound. However it was stated on report that she had biphasic pulses and apparently good blood flow. ========== 06/03/17 she is here in follow-up evaluation for right lower extremity ulcer. The right lower sure he has healed but she has reopened to the left medial malleolus and dorsal foot with weeping. She is waiting for new compression garments to arrive from home health, the previous compression garments were ill fitting. We will continue with compression bilaterally and follow-up in 2 weeks Readmission: 08/05/17 on evaluation today patient appears to be doing somewhat poorly in regard to her left lower extremity especially although the right lower extremity has a small area which may no longer be open. She has been having a lot of drainage from the left lower extremity however he tells me that she has not been able to use the EXTREMIT-EASE Compression at this point. She states that she did better and was able to actually apply the Juxta-Lite compression although the wound that she has is too large and therefore really does not compress which is why she cannot wear it at this point. She has no one who can help her put it on regular basis her son can sometimes but he's not able to do it most of the time. I do believe that's why she has begun to weave and have issues as she is currently yet again. No fevers, chills, nausea, or vomiting noted at this time. Patient is no evidence of dementia. 08/12/17 on evaluation today patient appears to still be doing fairly well in regard to the draining areas/weeping areas at this point. With that being said she unfortunately did go to the ER yesterday due to what was felt to be possibly a cellulitis. They place her on doxycycline by mouth and discharge her home. She definitely was not admitted. With that  being said she states she has had more discomfort which has been unusual for her even compared to prior times and she's had infections.08/12/17 on evaluation today patient appears to still be doing fairly well in regard to the draining areas/weeping areas at this point. With that being said she unfortunately did go to the ER yesterday due to what was felt to be possibly a cellulitis. They place her on doxycycline by mouth and discharge her home. She definitely was not admitted. With that being said she states she has had more discomfort which has been unusual  for her even compared to prior times and she's had infections. 08/19/17 put evaluation today patient tells me that she's been having a lot of what sounds to be neuropathic type pain in regard to her left lower extremity. She has been using over-the-counter topical bins again which some believe. That in order to apply the she actually remove the wrap we put on her last Wednesday on Thursday. Subsequently she has not had anything on compression wise since that time. The good news is a lot of the weeping areas appear to have closed at this point again I believe she would do better with compression but we are struggling to get her to actually use what she needs to at this point. No fevers, chills, nausea, or vomiting noted at this time. 09/03/17 on evaluation today patient appears to be doing okay in regard to her lower extremities in regard to the lymphedema and weeping. Fortunately she does not seem to show any signs of infection at this point she does have a little bit of weeping occurring in the right medial malleolus area. With that being said this does not appear to be too significant which is good news. 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 She has severe skin damage secondary to both of these features involving the dorsal left foot and medial left ankle and lower leg. Still  has open areas in the left anterior foot. The area on the right closed over. She uses her own juxta light stockings. She does not have an arterial issue 09/16/17 on evaluation today patient actually appears to be doing excellent in regard to her bilateral lower extremity swelling. The Juxta-Lite compression wrap seem to be doing very well for her. She has not however been using the portion that goes over her foot. Her left foot still is draining a little bit not nearly as significant as it has been in the past but still I do believe that she likely needs to utilize the full wrap including the foot portion of this will improve as well. She also has been apparently putting on a significant amount of Vaseline which also think is not helpful for her. I recommended that if she feels she needs something for moisturizer Eucerin will probably be better. 09/30/17 on evaluation today patient presents with several new open areas in regard to her left lower extremity although these appear to be minimal and mainly seem to be more moisture breakdown than anything. Fortunately she does not seem to have any evidence of infection which is great news. She has been tolerating the dressing changes without complication we are using silver alginate on the foot she has been using AB pads to have the legs and using her Juxta- Lite compression which seems to be controlling her swelling very well. Overall I'm pleased with the poor way she has progressed. 10/14/17 on evaluation today patient appears to be doing better in regard to her left lower extremity areas of weeping. She does still have some discomfort although in general this does not appear to be as macerated and I think it is progressing nicely. I do think she still needs to wear the foot portion of her Juxta- Lite in order to get the most benefit from the wrap obviously. She states she understands. Fortunately there does not appear to be evidence of infection at this time  which is great news. 10/28/17 on evaluation today patient appears to be doing excellent in regard  to her left lower extremity. She has just a couple areas that are still open and seem to be causing any trouble whatsoever. For that reason I think that she is definitely headed in the right direction the spots are very tiny compared to what we have been dealing with in the past. 11/11/17 on evaluation today patient appears to have a right lateral lower extremity ulcer that has opened since I last saw her. She states this is where the home health nurse that was coming out remove the dressing without wetting the alginate first. Nonetheless I do not know if this is indeed the case or not but more importantly we have not ordered home help to be coming out for her wounds at all. I'm unsure as to why they are coming out and we're gonna have to check on this and get things situated in that regard. With that being said we currently really do not need them to be coming out as the patient has been taking care of her leg herself without complication and no issues. In fact she was doing much better prior to nursing coming out. 11/25/17 on evaluation today patient actually appears to be doing fairly well in regard to her left lower extremity swelling. In fact she has very little area of weeping at this point there's just a small spot on the lateral portion of her right leg that still has me just a little bit more concerned as far as wanting to see this clear up before I discharge her to caring for this at home. Nonetheless overall she has made excellent progress. 12/09/17 on evaluation today patient appears to be doing rather well in regard to her lower extremity edema. She does have some weeping still in the left lower extremity although the big area we were taking care of two weeks ago actually has closed and she has another area of weeping on the left lower extremity immediately as well is the top of her foot. She does  not currently have lymphedema pumps she has been wearing her compression daily on a regular basis as directed. With that being said I think she may benefit from lymphedema pumps. 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 We had a very difficult time getting and keeping this under control. 12/23/17 on evaluation today patient actually appears to be doing a little bit more poorly in regard to her bilateral lower extremities. She has been tolerating the Juxta-Lite compression wraps. Unfortunately she has two new ulcers on the right lower extremity and left lower Trinity ulceration seems to be larger. Obviously this is not good news. She has been tolerating the dressings without complication. 12/30/17 on evaluation today patient actually appears to be doing much better in regard to her bilateral lower extremity edema. She continues to have some issues with ulcerations and in fact there appears to be one spot on each leg where the wrap may have caused a little bit of a blister which is subsequently opened up at this point is given her pain. Fortunately it does not appear to be any evidence of infection which is good news. No fevers chills noted. 01/13/18 on evaluation today patient appears to be doing rather well in regard to her bilateral lower extremities. The dressings did get kind of stuck as far as the wound beds are concerned but  again I think this is mainly due to the fact that she actually seems to be showing signs of healing which is good news. She's not having as much drainage therefore she was having more of the dressing sticking. Nonetheless overall I feel like her swelling is dramatically down compared to previous. 01/20/18 on evaluation today patient unfortunately though she's doing better in most regards has a large blister on the left anterior  lower extremity where she is draining quite significantly. Subsequently this is going to need debridement today in order to see what's underneath and ensure she does not continue to trapping fluid at this location. Nonetheless No fevers, chills, nausea, or vomiting noted at this time. 01/27/18 on evaluation today patient appears to be doing rather well at this point in regard to her right lower extremity there's just a very small area that she still has open at this point. With that being said I do believe that she is tolerating the compression wraps very well in making good progress. Home health is coming out at this point to see her. Her left lower extremity on the lateral portion is actually what still mainly open and causing her some discomfort for the most part 02/10/18 on evaluation today patient actually appears to be doing very well in regard to her right lower extremity were all the ulcers appear to be completely close. In regard to the left lower extremity she does have two areas still open and some leaking from the dorsal surface of her foot but this still seems to be doing much better to me in general. 02/24/18 on evaluation today patient actually appears to be doing much better in regard to her right lower extremity this is still completely healed. Her left lower extremity is also doing much better fortunately she has no evidence of infection. The one area that is gonna require some debridement is still on the left anterior shin. Fortunately this is not hurting her as badly today. 03/10/18 on evaluation today patient appears to be doing better in some regards although she has a little bit more open area on the dorsal foot and she also has some issues on the medial portion of the left lower extremity which is actually new and somewhat deep. With that being said there fortunately does not appear to be any significant signs of infection which is good news. No fevers, chills, nausea, or vomiting  noted at this time. In general her swelling seems to be doing fairly well which is good news. 03/31/18 on evaluation today patient presents for follow-up concerning her left lower extremity lymphedema. Unfortunately she has been doing a little bit more poorly since I last saw her in regard to the amount of weeping that she is experiencing. She's also having some increased pain in the anterior shin location. Unfortunately I do not feel like the patient is making such good progress at this point a few weeks back she was definitely doing much better. 04/07/18 on evaluation today patient actually appears to be showing some signs of improvement as far as the left lower extremity is concerned. She has been tolerating the dressing changes and it does appear that the Drawtex did better for her. With that being said unfortunately home health is stating that they cannot obtain the Drawtex going forward. Nonetheless we're gonna have to check and see what they may be able to get the alginate they were using was getting stuck in causing new areas of skin being pulled all that with  and subsequently weep and calls her to worsen overall this is the first time we've seen improvement at this time. 04/14/18 on evaluation today patient actually appears to be doing rather well at this point there does not appear to be any evidence of infection at this time and she is actually doing excellent in regard to the weeping in fact she almost has no openings remaining even compared to just last week this is a dramatic improvement. No fevers chills noted 04/21/18 evaluation today patient actually appears to be doing very well. She in fact is has a small area on the posterior lower extremity location and she has a small area on the dorsal surface of her foot that are still open both of which are very close to closing. We're hoping this will be close shortly. She brought her Juxta-Lite wrap with her today hoping that would be able to  put her in it unfortunately I don't think were quite at that point yet but we're getting closer. 04/28/18 upon evaluation today patient actually appears to be doing excellent in regard to her left lower extremity ulcer. In fact the region on the posterior lower extremity actually is much smaller than previously noted. Overall I'm very happy with the progress she has made. She again did bring her Juxta-Lite although we're not quite ready for that yet. 05/11/18 upon evaluation today patient actually appears to be doing in general fairly well in regard to her left lower Trinity. The swelling is very well controlled. With that being said she has a new area on the left anterior lower extremity as well as between the first and second toes of her left foot that was not present during the last evaluation. The region of her posterior left lower extremity actually appears to be almost completely healed. T be honest I'm very pleased with o the way that stands. Nonetheless I do believe that the lotion may be keeping the area to moist as far as her legs are concerned subsequently I'm gonna consider discontinuing that today. 05/26/18 on evaluation today patient appears to be doing rather well in regard to her left lower should be ulcers. In fact everything appears to be close except for a very small area on the left posterior lower extremity. Fortunately there does not appear to be any evidence of infection at this time. Overall very pleased with her progress. 06/02/18 and evaluation today patient actually appears to be doing very well in regard to her lower extremity ulcers. She has one small area that still continues to weep that I think may benefit her being able to justify lotion and user Juxta-Lite wraps versus continued to wrap her. Nonetheless I think this is something we can definitely look into at this point. 06/23/18 on evaluation today patient unfortunately has openings of her bilateral lower extremities.  In general she seems to be doing much worse than when I last saw her just as far as her overall health standpoint is concerned. She states that her discomfort is mainly due to neuropathy she's not having any other issues otherwise. No fevers, chills, nausea, or vomiting noted at this time. 06/30/18 on evaluation today patient actually appears to be doing a little worse in regard to her right lower extremity her left lower extremity of doing fairly well. Fortunately there is no sign of infection at this time. She has been tolerating the dressing changes without complication. Home health did not come out like they were supposed to for the appropriate wrap changes.  They stated that they never received the orders from [place] which were fax. Nonetheless we will send a copy of the orders with the patient today as well. 07/07/18 on evaluation today patient appears to be doing much better in regard to lower extremities. She still has several openings bilaterally although since I last saw her her legs did show obvious signs of infection when she later saw her nurse. Subsequently a culture was obtained and she is been placed on Bactrim and Keflex. Fortunately things seem to be looking much better it does appear she likely had an infection. Again last week we'd even discussed it but again there really was not any obvious sign that she had infection therefore we held off on the antibiotics. Nonetheless I'm glad she's doing better today. 07/14/18 on evaluation today patient appears to be doing much better regarding her bilateral lower Trinity's. In fact on the right lower for me there's nothing open at this point there are some dry skin areas at the sites where she had infection. Fortunately there is no evidence of systemic infection which is excellent news. No fevers chills noted 07/21/18 on evaluation today patient actually appears to be doing much better in regard to her left lower extremity ulcers. She is making good  progress and overall I feel like she's improving each time I see her. She's having no pain I do feel like the infection is completely resolved which is excellent news. No fevers, chills, nausea, or vomiting noted at this time. 07/28/18 on evaluation today patient appears to be doing very well in regard to her left lower [employer]. Everything seems to be showing signs of improvement which is excellent news. Overall very pleased with the progress that has been made. Fortunately there's no evidence of active infection at this time also excellent news. 08/04/18 on evaluation today patient appears to be doing more poorly in regard to her bilateral lower extremities. She has two new areas open up on the right and these were completely closed as of last week. She still has the two spots on the left which in my pinion seem to be doing better. Fortunately there's no evidence of infection again at this point. 08/11/18 on evaluation today patient actually appears to be doing very well in regard to her bilateral lower Trinity wounds that all seem to be doing better and are measures smaller today. Fortunately there's no signs of infection. No fevers, chills, nausea, or vomiting noted at this time. 08/18/18 on evaluation today patient actually appears to be doing about the same inverter bilateral lower extremities. She continues to have areas that blistering open as was drain that fortunately nothing too significant. Overall I feel like Drawtex may have done better for her however compared to the collagen. 08/25/18 on evaluation today patient appears to be doing a little bit more poorly today even compared to last time I saw her. Again I'm not exactly sure why she's making worse progress over the past several weeks. I'm beginning to wonder if there is some kind of underlying low level infection causing this issue. I did actually take a culture from the left anterior lower extremity but it was a new wound draining  quite a bit at this point. Unfortunately she also seems to be having more pain which is what also makes me worried about the possibility of infection. This is despite never erythema noted at this point. 09/01/18 on evaluation today patient actually appears to be doing a little worse even compared  to last week in regard to bilateral lower extremities. She did go to the hospital on the 16th was given a dose of IV Zosyn and then discharged with a recommendation to continue with the Bactrim that I previously prescribed for her. Nonetheless she is still having a lot of discomfort she tells me as well at this time. This is definitely unfortunate. No fevers, chills, nausea, or vomiting noted at this time. 09/08/18 on evaluation today patient's bilateral lower extremities actually appear to be shown signs of improvement which is good news. Fortunately there does not appear to be any signs of active infection I think the anabiotic is helping in this regard. Overall I'm very pleased with how she is progressing. 09/15/18 patient was actually seen in ER yesterday due to her legs as well unfortunately. She states she's been having a lot of pain and discomfort as well as a lot of drainage. Upon inspection today the patient does have a lot of swelling and drainage I feel like this is more related to lymphedema and poor fluid control than it is to infection based on what I'm seeing. The physician in the emergency department also doubted that the patient was having a significant infection nonetheless I see no evidence of infection obvious at this point although I do see evidence of poor fluid control. She still not using a compression pumps, she is not elevating due to her lift chair as well as her hospital bed being broken, and she really is not keeping her legs up as much as they should be and also has been taking off her wraps. All this combined I think has led to poor fluid control and to be honest she may be somewhat  volume overloaded in general as well. I recommend that she may need to contact your physician to see if a prescription for a diuretic would be beneficial in their opinion. As long as this is safe I think it would likely help her. 09/29/18 on evaluation today patient's left lower extremity actually appears to be doing quite a bit better. At least compared to last time that I saw her. She still has a large area where she is draining from but there's a lot of new skin speckled trout and in fact there's more new skin that there are open areas of weeping and drainage at this point. This is good news. With regard to the right lower extremity this is doing much better with the only open area that I really see being a dry spot on the right lateral ankle currently. Fortunately there's no signs of active infection at this time which is good news. No fevers, chills, nausea, or vomiting noted at this time. The patient seems somewhat stressed and overwhelmed during the visit today she was very lethargic as such. She does and she is not taking any pain medications at this point. Apparently according to her husband are also in the process of moving which is probably taking its toll on her as well. 10/06/18 on evaluation today patient appears to be doing rather well in regard to her lower extremities compared to last evaluation. Fortunately there's no signs of active infection. She tells me she did have an appointment with her primary. Nonetheless he was concerned that the wounds were somewhat deep based on pictures but we never actually saw her legs. She states that he had her somewhat worried due to the fact that she was fearing now that she was [place] have to have an amputation.  With that being said based on what I'm seeing check she looks better this week that she has the last two times I've seen her with much less drainage I'm actually pleased in this regard. That doesn't mean that she's out of the water but again I  do not think what the point of talking about education at all in regard to her leg. She is very happy to hear this. She is also not having as much pain as she was having last week. 10/13/18 unfortunately on evaluation today patient still continues to have a significant amount of drainage she's not letting home health actually apply the compression dressings at this point. She's trying to use of Juxta-Lite of the top of Kerlex and the second layer of the three layer compression wrap. With that being said she just does not seem to be making as good a progress as I would expect if she was having the compression applied and in place on a regular basis. No fevers, chills, nausea, or vomiting noted at this time. 10/20/18 on evaluation today patient appears to be doing a little better in regard to her bilateral lower extremity ulcers. In fact the right lower extremity seems to be healed she doesn't even have any openings at this point left lower extremity though still somewhat macerated seems to be showing signs of new skin growth at multiple locations throughout. Fortunately there's no evidence of active infection at this time. No fevers, chills, nausea, or vomiting noted at this time. 10/27/18 on evaluation today patient appears to be doing much better in regard to her left lower [place] ulcer. She's been tolerating the laptop complication and has minimal drainage noted at this point. Fortunately there's no signs of active infection at this time. No fevers, chills, nausea, or vomiting noted at this time. 11/03/18 on evaluation today patient actually appears to be doing excellent in regard to her left lower extremity. She is having very little drainage at this point there does not appear to be any significant signs of infection overall very pleased with how things have gone. She is likewise extremely pleased still and seems to be making wonderful progress week to week. I do believe antibiotics were helpful for  her. Her primary care provider did place on amateur clean since I last saw her. 11/17/18 on evaluation today patient appears to be doing worse in regard to her bilateral lower extremities at this point. She is been tolerating the dressing changes without complication. With that being said she typically takes the Coban off fairly quickly upon arriving home even after being seen here in the clinic and does not allow home health reapply command as part of the dressing at home. Therefore she said no compression essentially since I last saw her as best I can tell. With that being said I think it shows and how much swelling she has in the open wounds that are noted at this point. Fortunately there's no signs of infection but unfortunately if she doesn't get this under control I think she will end up with infection and more significant issues. 11/24/18 on evaluation today patient actually appears to be doing somewhat better in regard to her bilateral lower extremities. 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 Nonetheless that is doing better currently so she should be able to  attempt reinitiating the lymphedema pumps at this time. No fevers, chills, nausea, or vomiting noted at this time. 12/01/18 upon evaluation today patient's left lower extremity appears to be doing quite well unfortunately her right lower extremity is not doing nearly as well. She has been tolerating the dressing changes without complication unfortunately she did not keep a wrap on the right at this time. Nonetheless I believe this has led to increased swelling and weeping in the world is actually much larger than during the last evaluation with her. 12/08/18 on evaluation today patient appears to be doing about the same at this point in regard to her right lower extremity. There is some more  palatable to touch I'm concerned about the possibility of there being some infection although I think the main issue is she's not keeping her compression wrap on which in turn is not allowing this area to heal appropriately. 12/22/18 on evaluation today patient appears to be doing better in regard to left lower extremity unfortunately significantly worse in regard to the right lower extremity. The areas of blistering and necrotic superficial tissue have spread and again this does not really appear to be signs of infection and all she just doesn't seem to be doing nearly as well is what she has been in the past. Overall I feel like the Augmentin did absolutely nothing for her she doesn't seem to have any infection again I really didn't think so last time either is more of a potential preventative measure and hoping that this would make some difference but I think the main issue is she's not wearing her compression. She tells me she cannot wear the [place] been we put on she takes it off pretty much upon getting home. Subsequently she worshiped Juxta-Lite when I questioned her about how often she wears it this is no more than three hours a day obviously that leaves 21 hours that she has no compression and this is obviously not doing well for her. Overall I'm concerned that if things continue to worsen she is at great risk of both infection as well as losing her leg. 01/05/19 on evaluation today patient appears to be doing well in regard to her left lower extremity which he is allowing [place] to wrap and not so well with regard to her right lower extremity which she is not allowing [place] to really wrap and keep the wrap on. She states that it hurts too badly whenever it's wrapped and she ends up having to take it off. She's been using the Juxta-Lite she tells me up to six hours a day although I question whether or not that's really been the case to be honest. Previously she told me three hours today nonetheless  obviously the legs as long as the wrap is doing great when she is not is doing much more poorly. 01/12/2019 on evaluation today patient actually appears to be doing a little better in my opinion with regard to her right lower extremity ulcer. She has a small open area on the left lower extremity unfortunately but again this I think is part of the normal fluctuation of what she is going to have to expect with regard to her legs especially when she is not using her lymphedema pumps on a regular basis. Subsequently based on what I am seeing today I think that she does seem to be doing slightly better with regard to her right lower extremity she did see her primary care provider on Monday they felt she  had an infection and placed her on 2 antibiotics. Both Cipro and clindamycin. Subsequently again she seems possibly to be doing a little bit better in regards to the right lower extremity she also tells me however she has been wearing the compression wrap over the past week since I spoke with her as well that is a Kerlix and Coban wrap on the right. No fevers, chills, nausea, vomiting, or diarrhea. 01/19/2019 on evaluation today patient appears to be doing better with regard to her bilateral lower extremities especially the right. I feel like the compression has been beneficial for her which is great news. She did get a call from her primary care provider on her way here today telling her that she did have methicillin- resistant Staphylococcus aureus and he was calling in a couple new antibiotics for her including a ointment to be applied she tells me 3 times a day. With that being said this sounds like likely to be Bactroban which I think could be applied with each dressing/wrap change but I would not be able to accommodate her applying this 3 times a day. She is in agreement with the least doing this we will add that to her orders today. 01/26/2019 on evaluation today patient actually appears to be doing much  better with regard to her right lower extremity. Her left lower extremity is also doing quite well all things considering. Fortunately there is no evidence of active infection at this time. No fevers, chills, nausea, vomiting, or diarrhea. 02/02/2019 on evaluation today patient appears to be doing much better compared to her last evaluation. Little by little off like her right leg is returning more towards normal. There does not appear to be any signs of active infection and overall she seems to be doing quite well which is great news. I am very pleased in this regard. No fevers, chills, nausea, vomiting, or diarrhea. 02/09/2019 upon evaluation today patient appears to be doing better with regard to her bilateral lower extremities. She has been tolerating the dressing changes without complication. Fortunately there is no signs of active infection at this time. No fevers, chills, nausea, vomiting, or diarrhea. 02/23/2019 on evaluation today patient actually appears to be doing quite well with regard to her bilateral lower extremities. She has been tolerating the dressing changes without complication. She is even used her pumps one time and states that she really felt like it felt good. With that being said she seems to be in good spirits and her legs appear to be doing excellent. 03/09/2019 on evaluation today patient appears to be doing well with regard to her right lower extremity there are no open wounds at this time she is having some discomfort but I feel like this is more neuropathy than anything. With regard to her left lower extremity she had several areas scattered around that she does have some weeping and drainage from but again overall she does not appear to be having any significant issues and no evidence of infection at this time which is good news. 03/23/2019 on evaluation today patient appears to be doing well with regard to her right lower extremity which she tells me is still close she is  using her juxta light here. Her left lower extremity she mainly just has an area on the foot which is still slightly draining although this also is doing great. Overall very pleased at this time. 04/06/2019 patient appears to be doing a little bit worse in regard to her left lower extremity  upon evaluation today. She feels like this could be becoming infected again which she had issues with previous. Fortunately there is no signs of systemic infection but again this is always a struggle with her with her legs she will go from doing well to not so well in a very short amount of time. 04/20/2019 on evaluation today patient actually appears to be doing quite well with regard to her right lower extremity I do not see any signs of active infection at this time. Fortunately there is no fever chills noted. She is still taking the antibiotics which I prescribed for her at this point. In regard to the left lower extremity I do feel like some of these areas are better although again she still is having weeping from several locations at this time. 04/27/2019 on evaluation today patient appears to be doing about the same if not slightly worse in regard to her left lower extremity ulcers. She tells me when questioned that she has been sleeping in her Hoveround chair in fact she tells me she falls asleep without even knowing it. I think she is spending a whole lot of time in the chair and less time walking and moving around which is not good for her legs either. On top of that she is in a seated position which is also the worst position she is not really elevating her legs and she is also not using her lymphedema pumps. All this is good to contribute to worsening of her condition in general. 05/18/2019 on evaluation today patient appears to be doing well with regard to her lower extremity on the right in fact this is showing no signs of any open wounds at this time. On the left she is continuing to have issues with  areas that do drain. Some of the regions have healed and there are couple areas that have reopened. She did go to the ER per the patient according to recommendations from the home health nurse due to what she was seen when she came out on 05/13/2019. Subsequently she felt like the patient needed to go to the hospital due to the fact that again she was having "milky white discharge" from her leg. Nonetheless she had and then was placed on doxycycline and subsequently seems to be doing better. 06/01/2019 upon evaluation today patient appears to be doing really in my opinion about the same. I do not see any signs of active infection which is good news. Overall she still has wounds over the bilateral lower extremities she has reopened on the right but this appears to be more of a crack where there is weeping/edema coming from the region. I do not see any evidence of infection at either site based on what I visualized today. 07/13/2019 upon evaluation today patient appears to be doing a little worse compared to last time I saw her. She since has been in the hospital from 06/21/2019 through 06/29/2019. This was secondary to having Covid. During that time they did apply lotion to her legs which unfortunately has caused her to develop a myriad of open wounds on her lower extremities. Her legs do appear to be doing better as far as the overall appearance is concerned but nonetheless she does have more open and weeping areas. 07/27/2019 upon evaluation today patient appears to be doing more poorly to be honest in regard to her left lower extremity in particular. There is no signs of systemic infection although I do believe she may have local infection.  She notes she has been having a lot of blue/green drainage which is consistent potentially with Pseudomonas. That may be something that we need to consider here as well. The doxycycline does not seem to have been helping. 08/03/2019 upon evaluation today patient  appears to be doing a little better in my opinion compared to last week's evaluation. Her culture I did review today and she is on appropriate medications to help treat the Enterobacter that was noted. Overall I feel like that is good news. With that being said she is unfortunately continuing to have a lot of drainage and though it is doing better I still think she has a long ways to go to get things dried up in general. Fortunately there is no signs of systemic infection. 08/10/2019 upon evaluation today patient appears to be doing may be slightly better in regard to her left lower extremity the right lower extremity is doing much better. Fortunately there is no signs of infection right now which is good news. No fevers, chills, nausea, vomiting, or diarrhea. 08/24/2019 on evaluation today patient appears to be doing slightly better in regard to her lower extremities. The left lower extremity seems to be healed the right lower extremity is doing better though not completely healed as far as the openings are concerned. She has some generalized issues here with edema and weeping secondary to her lymphedema though again I do believe this is little bit drier compared to prior weeks evaluations. In general I am very pleased with how things seem to be progressing. No fevers, chills, nausea, vomiting, or diarrhea. 08/31/2019 upon evaluation today patient actually seems to making some progress here with regard to the left lower extremity in particular. She has been tolerating the dressing changes without complication. Fortunately there is no signs of active infection at this time. No fevers, chills, nausea, vomiting, or diarrhea. She did see Dr. Doren Custard and he did note that she did have a issue with the left great saphenous vein and the small saphenous vein in the leg. With that being said he was concerned about the possibility of laser ablation not being extremely successful. He also mentioned a small risk of DVT  associated with the procedure. However if the wounds do not continue to improve he stated that that would probably be the way to go. Fortunately the patient's legs do seem to be doing much better. 09/07/2019 upon evaluation today patient appears to be doing better with regard to her lower extremities. She has been tolerating the dressing changes without complication. With that being said she is showing signs of improvement and overall very pleased. There are some areas on her leg that I think we do need to debride we discussed this last week the patient is in agreement with doing that as long as it does not hurt too badly. 09/14/2019 upon evaluation today patient appears to be doing decently well with regard to her left lower extremity. She is not having near as much weeping as she has had in the past things seem to be drying up which is good news. There is no signs of active infection at this time. 09/21/19 upon evaluation today patient appears to be doing better in regard overall to her bilateral lower extremities. She again has less open than she did previous and each week I feel like this is getting better. Fortunately there is no signs of active infection at this time. No fevers, chills, nausea, vomiting, or diarrhea. 09/28/2019 upon evaluation today patient actually  appears to be showing signs of improvement with regard to her left lower extremity. Unfortunately the right medial lower extremity around the ankle region has reopened to some degree but this appears to be minimal still which is good news. There is no signs of active infection at this time which is also good news. 10/12/2019 upon evaluation today patient appears to be doing okay with regard to her bilateral lower extremities today. The right is a little bit worse then last evaluation 2 weeks ago. The left is actually doing a little better in my opinion. Overall there is no signs of active infection at this time that I see. Obviously that  something we have to keep a close eye on she is very prone to this with the significant and multiple openings that she has over the bilateral lower extremities. 10/19/2019 upon evaluation today patient appears to be doing about the best that I have seen her in quite some time. She has been tolerating the dressing changes without complication. There does not appear to be any signs of active infection and overall I am extremely happy with the way her legs appeared. She is drying up quite nicely and overall is having less pain. 11/09/2019 upon evaluation today patient appears to be doing better in regard to her wounds. She seems to be drying up more and more each time I see her this is just taking a very long time. Fortunately there is no signs of active infection at this time. 11/23/2019 upon evaluation today patient actually appears to be doing excellent in regard to her lower extremities at this point compared to where she has been. Fortunately there is no signs of active infection at this time. She did go to the hospital last week for nausea and vomiting completely unrelated to her wounds. Fortunately she is doing better she was given some Reglan and got better. She had associated abdominal pain but they never found out what was going on. 12/07/2019 upon evaluation today patient appears to be doing well for the most part in regard to her legs. She unfortunately has not been keeping the Coban portion of her wraps on therefore the compression has not really been sufficient for what it is supposed to be. Nonetheless she tells me that it just hurt too bad therefore she removed it. 12/21/2019 upon evaluation today patient actually appears to be doing quite well with regard to her legs. I do feel like she has been making progress which is great news and overall there is no signs of active infection at this time. No fevers, chills, nausea, vomiting, or diarrhea. 01/04/2020 upon evaluation today patient presents for  follow-up concerning her lower extremity edema bilaterally. She still has open wounds she has not been using her lymphedema pumps. She is also not been utilizing her compression wraps appropriately she tends to unwrap them, take them off, or states that they hurt. Obviously the reason they hurt is because her legs start to swell but the issue is if she would use her compression/lymphedema pumps regularly she would not swell and she would have the pain. Nonetheless she has not even picked them up once honestly over the past several months and may be even as much as in the past year based on my opinion and what have seen. She tells me today that after last week when I talked about this with her specifically actually that was 2 weeks ago that she "forgot". 8//21 on evaluation today patient appears to be doing  a little better in regard to her legs bilaterally. Fortunately there is no signs of active infection at this time. She tells me that she used her lymphedema pumps all of one time over the past 2 weeks since I last saw her. She tells me that she has been too busy in order to continue to use these. 02/01/2020 on evaluation today patient appears to be doing some better in regard to her wounds in general in her legs. We felt the right was healed although is not completely it does appear to be doing better she tells me she has been using her lymphedema pumps that she has had this six times since I last saw her. Obviously the more she does that the better she would do my opinion 02/15/2020 upon evaluation today patient appears to be doing about the same in regard to her legs. She tells me that she is pumping I'm still not sure how much she does to be perfectly honest. However even if she does a little bit here and there I guess that is better than nothing. Fortunately there is no sign of active infection at this time which is great news. No fevers, chills, nausea, vomiting, or diarrhea. 02/29/2020 on  evaluation today patient actually appears to be doing quite well all things considered this week. She has been tolerating the dressing changes without complication. Fortunately there is no signs of active infection at this time. No fevers, chills, nausea, vomiting, or diarrhea. 03/14/2020 upon evaluation today patient appears to be doing really about the same in regard to her legs. There is no signs of improvement overall and she as noted from home health does not appear to be elevating her legs he can get into her lift chair. There is too much stuff piled up on it the patient tells me. She also tells me she cannot really use her pumps effectively due to the fact that she cannot have any space to get them on. Finally she is also not really elevating her legs because she is not sleeping in her bed she is sleeping in her chair currently and again overall I think everything that she is done in combination has been exactly the wrong thing for what she needs for her legs. 04/04/2020 upon evaluation today patient appears to be doing well at this time with regard to her legs. She is actually been pumping, keeping her wraps on, and to be honest she seems to be doing dramatically better the right leg is excellent the left leg is also excellent and measuring much smaller than previous. 04/18/2020 upon evaluation today patient actually is continue to make good progress in regard to her lower extremities bilaterally. Everything is improving and less wet that has been in the past overall I am extremely pleased with where things stand and I think that she is making great progress. The patient tells me she still continue to use her compression pumps 05/02/2020 on evaluation today patient appears to be doing well at this time in regard to her left leg which is showing signs of drying up. With that being said she does have a lot of lymphedema type crusty skin around the toes of her left foot and the right medial ankle  which has opened at this point. Fortunately there is no signs of active infection systemically at this point or even locally for that matter. Objective Constitutional Obese and well-hydrated in no acute distress. Vitals Time Taken: 10:55 AM, Height: 62 in, Weight: 335 lbs,  BMI: 61.3, Temperature: 98.4 F, Pulse: 86 bpm, Respiratory Rate: 20 breaths/min, Blood Pressure: 156/97 mmHg. Respiratory normal breathing without difficulty. Psychiatric this patient is able to make decisions and demonstrates good insight into disease process. Alert and Oriented x 3. pleasant and cooperative. General Notes: Wound bed currently showed signs of good granulation epithelization a lot of areas especially on the left lower extremity. With that being said I did actually perform debridement to clear away some of the thickened and fibrotic tissue overlying the toes and edges of the wound on the left foot and right medial ankle/lower leg region. She tolerated all this today without complication post debridement wound bed appeared to be doing much better in general. Integumentary (Hair, Skin) Wound #61 status is Open. Original cause of wound was Gradually Appeared. The wound is located on the Left,Circumferential Lower Leg. The wound measures 12.5cm length x 18cm width x 0.1cm depth; 176.715cm^2 area and 17.671cm^3 volume. There is Fat Layer (Subcutaneous Tissue) exposed. There is no tunneling or undermining noted. There is a medium amount of serous drainage noted. The wound margin is flat and intact. There is large (67-100%) pink, pale granulation within the wound bed. There is no necrotic tissue within the wound bed. Wound #64 status is Open. Original cause of wound was Gradually Appeared. The wound is located on the Left,Dorsal Foot. The wound measures 7cm length x 6cm width x 0.1cm depth; 32.987cm^2 area and 3.299cm^3 volume. The wound is limited to skin breakdown. There is no tunneling or undermining noted.  There is a medium amount of serous drainage noted. The wound margin is flat and intact. There is large (67-100%) pink, pale granulation within the wound bed. There is no necrotic tissue within the wound bed. Wound #66 status is Healed - Epithelialized. Original cause of wound was Gradually Appeared. The wound is located on the Right,Anterior Lower Leg. The wound measures 0cm length x 0cm width x 0cm depth; 0cm^2 area and 0cm^3 volume. There is no tunneling or undermining noted. There is a none present amount of drainage noted. The wound margin is distinct with the outline attached to the wound base. There is no granulation within the wound bed. There is no necrotic tissue within the wound bed. Wound #67 status is Open. Original cause of wound was Gradually Appeared. The wound is located on the Right,Medial Lower Leg. The wound measures 1.5cm length x 1.2cm width x 0.1cm depth; 1.414cm^2 area and 0.141cm^3 volume. There is Fat Layer (Subcutaneous Tissue) exposed. There is no tunneling or undermining noted. There is a medium amount of serous drainage noted. The wound margin is thickened. There is large (67-100%) red granulation within the wound bed. There is no necrotic tissue within the wound bed. 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 There was a Selective/Open  Wound Skin/Epidermis Debridement with a total area of 20 sq cm performed by [name], PA. With the following instrument(s): Curette to remove Non-Viable tissue/material. Material removed includes Callus, Skin: Epidermis, and Fibrin/Exudate after achieving pain control using Other (benzocaine 20% spray). No specimens were taken. A time out was conducted at 11:25, prior to the start of the procedure. There was no bleeding. The procedure was tolerated well with a pain level of 0 throughout and a pain level of 0 following the procedure. Post Debridement Measurements: 7cm length x 6cm width x 0.1cm depth; 3.299cm^3 volume. Character of Wound/Ulcer Post Debridement requires further debridement. Severity of Tissue Post Debridement is: Limited to breakdown of skin. Post procedure Diagnosis Wound #64: Same as Pre-Procedure Pre-procedure diagnosis of Wound #64 is a Diabetic Wound/Ulcer of the Lower Extremity located on the Left,Dorsal Foot . There was a Three Layer Compression Therapy Procedure by Carlene Coria, RN. Post procedure Diagnosis Wound #64: Same as Pre-Procedure Wound #67 Pre-procedure diagnosis of Wound #67 is a Diabetic Wound/Ulcer of the Lower Extremity located on the Right,Medial Lower Leg .Severity of Tissue Pre Debridement is: Fat layer exposed. There was a Selective/Open Wound Skin/Epidermis Debridement with a total area of 3 sq cm performed by [name], PA. With the following instrument(s): Curette to remove Non-Viable tissue/material. Material removed includes Callus, Skin: Epidermis, and Fibrin/Exudate after achieving pain control using Other (benzocaine 20% spray). No specimens were taken. A time out was conducted at 11:25, prior to the start of the procedure. There was no  bleeding. The procedure was tolerated well with a pain level of 0 throughout and a pain level of 0 following the procedure. Post Debridement Measurements: 1.5cm length x 1.2cm width x 0.1cm depth; 0.141cm^3 volume. Character of Wound/Ulcer Post Debridement requires further debridement. Severity of Tissue Post Debridement is: Fat layer exposed. Post procedure Diagnosis Wound #67: Same as Pre-Procedure Pre-procedure diagnosis of Wound #67 is a Diabetic Wound/Ulcer of the Lower Extremity located on the Right,Medial Lower Leg . There was a Three Layer Compression Therapy Procedure by Carlene Coria, RN. Post procedure Diagnosis Wound #67: Same as Pre-Procedure Wound #61 Pre-procedure diagnosis of Wound #61 is a Venous Leg Ulcer located on the Left,Circumferential Lower Leg . There was a Three Layer Compression Therapy Procedure by Carlene Coria, RN. Post procedure Diagnosis Wound #61: Same as Pre-Procedure Plan Follow-up Appointments: Return appointment in 3 weeks. Dressing Change Frequency: Wound #61 Left,Circumferential Lower Leg: Other: - 2 times per week Wound #64 Left,Dorsal Foot: Other: - 2 times per week Wound #67 Right,Medial Lower Leg: Other: - 2 times per week Skin Barriers/Peri-Wound Care: Moisturizing lotion - to dry skin Wound Cleansing: Clean wound with Wound Cleanser - wash legs with soap and water with dressing changes May shower with protection. Primary Wound Dressing: Wound #61 Left,Circumferential Lower Leg: Calcium Alginate with Silver Wound #67 Right,Medial Lower Leg: Calcium Alginate with Silver Wound #64 Left,Dorsal Foot: Calcium Alginate with Silver Secondary Dressing: Wound #61 Left,Circumferential Lower Leg: Dry Gauze ABD pad - as needed Wound #64 Left,Dorsal Foot: Dry Gauze ABD pad - as needed Edema Control: 3 Layer Compression System - Bilateral Avoid standing for long periods of time - walking is encouraged Elevate legs to the level of the heart or  above for 30 minutes daily and/or when sitting, a frequency of: - do not sleep in chair with feet dangling, MUST elevate legs in recliner while sitting Exercise regularly Segmental Compressive Device. - lymphedema pumps 60 minutes 1- 2 times per  day Off-Loading: Turn and reposition every 2 hours Additional Orders / Instructions: Follow Nutritious Diet - T include vitamin A, vitamin C, and Zinc along with increased protein intake. o Home Health: [facility] for [facility]. - [facility]. Would recommend at this time that we actually go ahead and continue with the wound care measures as before and the patient is in agreement with the plan that includes the use of a silver alginate dressing to any open wound locations which I think has been doing a great job. 2. I am also going to recommend at this time that the patient continue with 3 layer compression wraps bilaterally. 3. I also recommend she continue use of lymphedema pumps and elevate her legs much as possible. We will see patient back for reevaluation in 3 weeks here in the clinic. If anything worsens or changes patient will contact our office for additional recommendations. Electronic Signature(s) Signed: 05/02/2020 11:38:58 AM By: [name] PA-C Entered By: [name] on 05/02/2020 11:38:57 -------------------------------------------------------------------------------- SuperBill Details Patient Name: Date of Service: [name] 05/02/2020 Medical Record Number: [id number] Patient Account Number: [id number] Date of Birth/Sex: Treating RN: [birth year] (69 y.o. [name] Primary Care Provider: [name] Other Clinician: Referring Provider: Treating Provider/Extender: Doyle Askew, FRED Weeks in Treatment: 143 Diagnosis Coding ICD-10 Codes Code Description E11.622 Type 2 diabetes mellitus with other skin ulcer I89.0 Lymphedema, not elsewhere classified I87.331 Chronic  venous hypertension (idiopathic) with ulcer and inflammation of right lower extremity I87.332 Chronic venous hypertension (idiopathic) with ulcer and inflammation of left lower extremity L97.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 leg with fat layer exposed L97.822 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 I10 Essential (primary) hypertension E66.01 Morbid (severe) obesity due to excess calories F41.8 Other specified anxiety disorders R53.1 Weakness Facility Procedures The patient participates with Medicare or their insurance follows the Medicare Facility Guidelines: CPT4 Code Description Modifier Quantity [id number] - DEBRIDE WOUND 1ST 20 SQ CM OR < 1 ICD-10 Diagnosis Description I87.331 Chronic venous  hypertension (idiopathic) with ulcer and inflammation of right lower extremity L97.522 Non-pressure chronic ulcer of other part of left foot with fat layer exposed The patient participates with Medicare or their insurance follows the Medicare Facility Guidelines: [id number] - DEBRIDE WOUND EA ADDL 20 SQ CM 1 ICD-10 Diagnosis Description L97.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 leg with  fat layer exposed L97.522 Non-pressure chronic ulcer of other part of left foot with fat layer exposed Physician Procedures : CPT4 Code Description Modifier [id number] - WC PHYS DEBR WO ANESTH 20 SQ CM ICD-10 Diagnosis Description I87.331 Chronic venous hypertension (idiopathic) with ulcer and inflammation of right lower extremity L97.522 Non-pressure chronic ulcer of  other part of left foot with fat layer exposed Quantity: 1 : [id number] - WC PHYS DEBR WO ANESTH EA ADD 20 CM ICD-10 Diagnosis Description J17.915 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 leg with fat layer exposed L97.522 Non-pressure chronic ulcer of other  part of left foot with fat layer  exposed Quantity: 1 Electronic Signature(s) Signed: 05/02/2020 11:39:19 AM By: [name] PA-C Entered By: [name] on 05/02/2020 11:39:17

## 2020-05-03 NOTE — Progress Notes (Signed)
[name], [name] ([id number]) Visit Report for 05/02/2020 Arrival Information Details Patient Name: Date of Service: [name], [name] 05/02/2020 10:15 A M Medical Record Number: [id number] Patient Account Number: [id number] Date of Birth/Sex: Treating RN: [birth year] (69 y.o. [name] Primary Care Tyshia Fenter: [name] Other Clinician: Referring Veneta Sliter: Treating Jassmine Vandruff/Extender: Doyle Askew, FRED Weeks in Treatment: 143 Visit Information History Since Last Visit Added or deleted any medications: No Patient Arrived: Wheel Chair Any new allergies or adverse reactions: No Arrival Time: 10:52 Had a fall or experienced change in No Accompanied By: alone activities of daily living that may affect Transfer Assistance: None risk of falls: Patient Identification Verified: Yes Signs or symptoms of abuse/neglect since last visito No Secondary Verification Process Completed: Yes Hospitalized since last visit: No Patient Requires Transmission-Based Precautions: No Implantable device outside of the clinic excluding No Patient Has Alerts: Yes cellular tissue based products placed in the center Patient Alerts: R ABI= 1.01 since last visit: L ABI = .99 Has Dressing in Place as Prescribed: Yes Has Compression in Place as Prescribed: Yes Pain Present Now: No Electronic Signature(s) Signed: 05/02/2020 5:17:00 PM By: Levan Hurst RN, BSN Entered By: Levan Hurst on 05/02/2020 10:52:45 -------------------------------------------------------------------------------- Compression Therapy Details Patient Name: Date of Service: [name] Duke. 05/02/2020 10:15 A M Medical Record Number: [id number] Patient Account Number: [id number] Date of Birth/Sex: Treating RN: 1950-09-20 (69 y.o. [name] Primary Care Bethel Sirois: [name] Other Clinician: Referring Farooq Petrovich: Treating Mahkai Fangman/Extender: Doyle Askew, FRED Weeks in Treatment: 143 Compression  Therapy Performed for Wound Assessment: Wound #67 Right,Medial Lower Leg Performed By: Clinician Carlene Coria, RN Compression Type: Three Layer Post Procedure Diagnosis Same as Pre-procedure Electronic Signature(s) Signed: 05/03/2020 2:58:18 PM By: [name] RN, BSN Entered By: [name] on 05/02/2020 11:27:39 -------------------------------------------------------------------------------- Compression Therapy Details Patient Name: Date of Service: [name] Duke. 05/02/2020 10:15 A M Medical Record Number: [id number] Patient Account Number: [id number] Date of Birth/Sex: Treating RN: 1950-11-13 (69 y.o. [name] Primary Care Quaron Delacruz: [name] Other Clinician: Referring Leba Tibbitts: Treating Ewel Lona/Extender: Doyle Askew, FRED Weeks in Treatment: 143 Compression Therapy Performed for Wound Assessment: Wound #61 Left,Circumferential Lower Leg Performed By: Clinician Carlene Coria, RN Compression Type: Three Layer Post Procedure Diagnosis Same as Pre-procedure Electronic Signature(s) Signed: 05/03/2020 2:58:18 PM By: [name] RN, BSN Entered By: [name] on 05/02/2020 11:27:39 -------------------------------------------------------------------------------- Compression Therapy Details Patient Name: Date of Service: [name] Duke. 05/02/2020 10:15 A M Medical Record Number: [id number] Patient Account Number: [id number] Date of Birth/Sex: Treating RN: Oct 29, 1950 (69 y.o. [name] Primary Care Braylyn Kalter: [name] Other Clinician: Referring Desire Fulp: Treating Ryshawn Sanzone/Extender: Doyle Askew, FRED Weeks in Treatment: 143 Compression Therapy Performed for Wound Assessment: Wound #64 Left,Dorsal Foot Performed By: Clinician Carlene Coria, RN Compression Type: Three Layer Post Procedure Diagnosis Same as Pre-procedure Electronic Signature(s) Signed: 05/03/2020 2:58:18 PM By: [name] RN, BSN Entered  By: [name] on 05/02/2020 11:27:40 -------------------------------------------------------------------------------- Encounter Discharge Information Details Patient Name: Date of Service: [name] Duke. 05/02/2020 10:15 A M Medical Record Number: [id number] Patient Account Number: [id number] Date of Birth/Sex: Treating RN: 1951-03-15 (69 y.o. [name] Primary Care Marge Vandermeulen: [name] Other Clinician: Referring Youssef Footman: Treating Michai Dieppa/Extender: Doyle Askew, FRED Weeks in Treatment: [phone] Encounter Discharge Information Items Post Procedure Vitals Discharge Condition: Stable Temperature (F): 98.4 Ambulatory Status: Wheelchair Pulse (bpm): 86 Discharge Destination: Home Respiratory Rate (breaths/min): 20 Transportation: Private Auto Blood Pressure (mmHg): 156/97 Accompanied By:  self Schedule Follow-up Appointment: Yes Clinical Summary of Care: Electronic Signature(s) Signed: 05/02/2020 5:29:55 PM By: Deon Pilling Entered By: Deon Pilling on 05/02/2020 11:50:01 -------------------------------------------------------------------------------- Lower Extremity Assessment Details Patient Name: Date of Service: [name], [name] 05/02/2020 10:15 A M Medical Record Number: [id number] Patient Account Number: [id number] Date of Birth/Sex: Treating RN: [birth year] (69 y.o. [name] Primary Care Rowin Bayron: [name] Other Clinician: Referring Kaylor Maiers: Treating Aiyonna Lucado/Extender: Doyle Askew, FRED Weeks in Treatment: 143 Edema Assessment Assessed: [Left: No] [Right: No] Edema: [Left: Yes] [Right: Yes] Calf Left: Right: Point of Measurement: 37 cm From Medial Instep 36.5 cm 33.7 cm Ankle Left: Right: Point of Measurement: 10 cm From Medial Instep 29 cm 22.9 cm Vascular Assessment Pulses: Dorsalis Pedis Palpable: [Left:Yes] [Right:Yes] Electronic Signature(s) Signed: 05/03/2020 2:58:18 PM By: [name] RN,  BSN Entered By: [name] on 05/02/2020 11:20:54 -------------------------------------------------------------------------------- Graham Details Patient Name: Date of Service: [name] Duke. 05/02/2020 10:15 A M Medical Record Number: [id number] Patient Account Number: [id number] Date of Birth/Sex: Treating RN: 15-Sep-1950 (69 y.o. [name] Primary Care Dalyn Becker: [name] Other Clinician: Referring Dionel Archey: Treating Herminia Warren/Extender: Doyle Askew, FRED Weeks in Treatment: [phone] Active Inactive Venous Leg Ulcer Nursing Diagnoses: Actual venous Insuffiency (use after diagnosis is confirmed) Knowledge deficit related to disease process and management Goals: Patient will maintain optimal edema control Date Initiated: 08/12/2017 Target Resolution Date: 05/09/2020 Goal Status: Active Patient/caregiver will verbalize understanding of disease process and disease management Date Initiated: 08/12/2017 Date Inactivated: 04/21/2018 Target Resolution Date: 04/24/2018 Goal Status: Met Interventions: Assess peripheral edema status every visit. Compression as ordered Treatment Activities: Therapeutic compression applied : 08/12/2017 Notes: Wound/Skin Impairment Nursing Diagnoses: Impaired tissue integrity Knowledge deficit related to ulceration/compromised skin integrity Goals: Patient/caregiver will verbalize understanding of skin care regimen Date Initiated: 08/12/2017 Target Resolution Date: 05/09/2020 Goal Status: Active Ulcer/skin breakdown will have a volume reduction of 30% by week 4 Date Initiated: 08/05/2017 Date Inactivated: 09/30/2017 Target Resolution Date: 10/03/2017 Goal Status: Met Ulcer/skin breakdown will have a volume reduction of 50% by week 8 Date Initiated: 09/30/2017 Date Inactivated: 10/28/2017 Target Resolution Date: 10/28/2017 Goal Status: Met Interventions: Assess patient/caregiver ability to perform ulcer/skin  care regimen upon admission and as needed Assess ulceration(s) every visit Provide education on ulcer and skin care Screen for HBO Treatment Activities: Patient referred to home care : 08/05/2017 Skin care regimen initiated : 08/05/2017 Topical wound management initiated : 08/05/2017 Notes: Electronic Signature(s) Signed: 05/03/2020 2:58:18 PM By: [name] RN, BSN Entered By: [name] on 05/02/2020 11:26:34 -------------------------------------------------------------------------------- Pain Assessment Details Patient Name: Date of Service: [name] Duke. 05/02/2020 10:15 A M Medical Record Number: [id number] Patient Account Number: [id number] Date of Birth/Sex: Treating RN: Dec 28, 1950 (69 y.o. [name] Primary Care Loreley Schwall: [name] Other Clinician: Referring Jamarion [place]: Treating Circe Chilton/Extender: Doyle Askew, FRED Weeks in Treatment: [phone] Active Problems Location of Pain Severity and Description of Pain Patient Has Paino No Patient Has Paino No Site Locations Pain Management and Medication Current Pain Management: Electronic Signature(s) Signed: 05/02/2020 5:17:00 PM By: Levan Hurst RN, BSN Entered By: Levan Hurst on 05/02/2020 10:56:21 -------------------------------------------------------------------------------- Patient/Caregiver Education Details Patient Name: Date of Service: [name] Duke 11/17/2021andnbsp10:15 A M Medical Record Number: [id number] Patient Account Number: [id number] Date of Birth/Gender: Treating RN: 1951-03-27 (69 y.o. [name] Primary Care Physician: [name] Other Clinician: Referring Physician: Treating Physician/Extender: Doyle Askew, FRED Weeks in Treatment: [phone] Education Assessment Education Provided To: Patient Education  Topics Provided Venous: Methods: Explain/Verbal Responses: Reinforcements needed, State content correctly Wound/Skin Impairment: Methods:  Explain/Verbal Responses: Reinforcements needed, State content correctly Electronic Signature(s) Signed: 05/03/2020 2:58:18 PM By: [name] RN, BSN Entered By: [name] on 05/02/2020 11:26:57 -------------------------------------------------------------------------------- Wound Assessment Details Patient Name: Date of Service: [name] Duke. 05/02/2020 10:15 A M Medical Record Number: [id number] Patient Account Number: [id number] Date of Birth/Sex: Treating RN: [birth year] (69 y.o. [name] Primary Care Amar Sippel: [name] Other Clinician: Referring Octivia Canion: Treating Jeanifer Halliday/Extender: Doyle Askew, FRED Weeks in Treatment: 143 Wound Status Wound Number: 61 Primary Venous Leg Ulcer Etiology: Wound Location: Left, Circumferential Lower Leg Wound Open Wounding Event: Gradually Appeared Status: Date Acquired: 04/20/2019 Comorbid Asthma, Hypertension, Peripheral Arterial Disease, Peripheral Weeks Of Treatment: 54 History: Venous Disease, Type II Diabetes, Gout, Osteoarthritis Clustered Wound: Yes Photos Photo Uploaded By: Mikeal Hawthorne on 05/03/2020 09:05:16 Wound Measurements Length: (cm) 12.5 Width: (cm) 18 Depth: (cm) 0.1 Clustered Quantity: 2 Area: (cm) 176.715 Volume: (cm) 17.671 % Reduction in Area: -2243.7% % Reduction in Volume: -2243.6% Epithelialization: Large (67-100%) Tunneling: No Undermining: No Wound Description Classification: Full Thickness Without Exposed Support Structures Wound Margin: Flat and Intact Exudate Amount: Medium Exudate Type: Serous Exudate Color: amber Foul Odor After Cleansing: No Slough/Fibrino Yes Wound Bed Granulation Amount: Large (67-100%) Exposed Structure Granulation Quality: Pink, Pale Fascia Exposed: No Necrotic Amount: None Present (0%) Fat Layer (Subcutaneous Tissue) Exposed: Yes Tendon Exposed: No Muscle Exposed: No Joint Exposed: No Bone Exposed: No Treatment Notes Wound  #61 (Left, Circumferential Lower Leg) 1. Cleanse With Wound Cleanser Soap and water 2. Periwound Care Moisturizing lotion 3. Primary Dressing Applied Calcium Alginate Ag 4. Secondary Dressing ABD Pad Dry Gauze 6. Support Layer Applied 3 layer compression wrap Notes netting. Electronic Signature(s) Signed: 05/03/2020 2:58:18 PM By: [name] RN, BSN Previous Signature: 05/02/2020 11:19:23 AM Version By: Sandre Kitty Entered By: [name] on 05/02/2020 11:23:05 -------------------------------------------------------------------------------- Wound Assessment Details Patient Name: Date of Service: [name] Duke. 05/02/2020 10:15 A M Medical Record Number: [id number] Patient Account Number: [id number] Date of Birth/Sex: Treating RN: 1950/12/21 (69 y.o. [name] Primary Care Demetre Monaco: [name] Other Clinician: Referring Earlin Sweeden: Treating Wynee Matarazzo/Extender: Doyle Askew, FRED Weeks in Treatment: 143 Wound Status Wound Number: 64 Primary Diabetic Wound/Ulcer of the Lower Extremity Etiology: Wound Location: Left, Dorsal Foot Wound Open Wounding Event: Gradually Appeared Status: Date Acquired: 06/01/2019 Comorbid Asthma, Hypertension, Peripheral Arterial Disease, Peripheral Weeks Of Treatment: 48 History: Venous Disease, Type II Diabetes, Gout, Osteoarthritis Clustered Wound: No Photos Photo Uploaded By: Mikeal Hawthorne on 05/03/2020 09:05:16 Wound Measurements Length: (cm) 7 Width: (cm) 6 Depth: (cm) 0.1 Area: (cm) 32.987 Volume: (cm) 3.299 % Reduction in Area: -19892.1% % Reduction in Volume: -6632.7% Epithelialization: Large (67-100%) Tunneling: No Undermining: No Wound Description Classification: Grade 1 Wound Margin: Flat and Intact Exudate Amount: Medium Exudate Type: Serous Exudate Color: amber Foul Odor After Cleansing: No Slough/Fibrino Yes Wound Bed Granulation Amount: Large (67-100%) Exposed  Structure Granulation Quality: Pink, Pale Fascia Exposed: No Necrotic Amount: None Present (0%) Fat Layer (Subcutaneous Tissue) Exposed: No Tendon Exposed: No Muscle Exposed: No Joint Exposed: No Bone Exposed: No Limited to Skin Breakdown Treatment Notes Wound #64 (Left, Dorsal Foot) 1. Cleanse With Wound Cleanser Soap and water 2. Periwound Care Moisturizing lotion 3. Primary Dressing Applied Calcium Alginate Ag 4. Secondary Dressing ABD Pad Dry Gauze 6. Support Layer Applied 3 layer compression wrap Notes netting. Electronic Signature(s) Signed: 05/03/2020 2:58:18 PM By: [name]  RN, BSN Previous Signature: 05/02/2020 11:19:23 AM Version By: Sandre Kitty Entered By: [name] on 05/02/2020 11:23:56 -------------------------------------------------------------------------------- Wound Assessment Details Patient Name: Date of Service: [name], [name] 05/02/2020 10:15 A M Medical Record Number: [id number] Patient Account Number: [id number] Date of Birth/Sex: Treating RN: Oct 27, 1950 (69 y.o. [name] Primary Care Deeksha Cotrell: [name] Other Clinician: Referring Deveon Kisiel: Treating Faustina Gebert/Extender: Doyle Askew, FRED Weeks in Treatment: 143 Wound Status Wound Number: 66 Primary Diabetic Wound/Ulcer of the Lower Extremity Etiology: Wound Location: Right, Anterior Lower Leg Wound Healed - Epithelialized Wounding Event: Gradually Appeared Status: Date Acquired: 03/14/2020 Comorbid Asthma, Hypertension, Peripheral Arterial Disease, Peripheral Weeks Of Treatment: 7 History: Venous Disease, Type II Diabetes, Gout, Osteoarthritis Clustered Wound: No Photos Photo Uploaded By: Mikeal Hawthorne on 05/03/2020 09:04:39 Wound Measurements Length: (cm) Width: (cm) Depth: (cm) Area: (cm) Volume: (cm) 0 % Reduction in Area: 100% 0 % Reduction in Volume: 100% 0 Epithelialization: Large (67-100%) 0 Tunneling: No 0 Undermining:  No Wound Description Classification: Grade 2 Wound Margin: Distinct, outline attached Exudate Amount: None Present Foul Odor After Cleansing: No Slough/Fibrino No Wound Bed Granulation Amount: None Present (0%) Exposed Structure Necrotic Amount: None Present (0%) Fascia Exposed: No Fat Layer (Subcutaneous Tissue) Exposed: No Tendon Exposed: No Muscle Exposed: No Joint Exposed: No Bone Exposed: No Electronic Signature(s) Signed: 05/03/2020 2:58:18 PM By: [name] RN, BSN Previous Signature: 05/02/2020 11:19:23 AM Version By: Sandre Kitty Entered By: [name] on 05/02/2020 11:25:08 -------------------------------------------------------------------------------- Wound Assessment Details Patient Name: Date of Service: [name] Duke. 05/02/2020 10:15 A M Medical Record Number: [id number] Patient Account Number: [id number] Date of Birth/Sex: Treating RN: 1950-10-23 (69 y.o. [name] Primary Care Aleyna Cueva: [name] Other Clinician: Referring Lucill Mauck: Treating Akiva Brassfield/Extender: Doyle Askew, FRED Weeks in Treatment: 143 Wound Status Wound Number: 67 Primary Diabetic Wound/Ulcer of the Lower Extremity Etiology: Wound Location: Right, Medial Lower Leg Wound Open Wounding Event: Gradually Appeared Status: Date Acquired: 05/02/2020 Comorbid Asthma, Hypertension, Peripheral Arterial Disease, Peripheral Weeks Of Treatment: 0 History: Venous Disease, Type II Diabetes, Gout, Osteoarthritis Clustered Wound: No Photos Photo Uploaded By: Mikeal Hawthorne on 05/03/2020 09:04:39 Wound Measurements Length: (cm) 1.5 Width: (cm) 1.2 Depth: (cm) 0.1 Area: (cm) 1.414 Volume: (cm) 0.141 % Reduction in Area: 0% % Reduction in Volume: 0% Epithelialization: Medium (34-66%) Tunneling: No Undermining: No Wound Description Classification: Grade 1 Wound Margin: Thickened Exudate Amount: Medium Exudate Type: Serous Exudate Color: amber Foul  Odor After Cleansing: No Slough/Fibrino No Wound Bed Granulation Amount: Large (67-100%) Exposed Structure Granulation Quality: Red Fascia Exposed: No Necrotic Amount: None Present (0%) Fat Layer (Subcutaneous Tissue) Exposed: Yes Tendon Exposed: No Muscle Exposed: No Joint Exposed: No Bone Exposed: No Treatment Notes Wound #67 (Right, Medial Lower Leg) 1. Cleanse With Wound Cleanser Soap and water 2. Periwound Care Moisturizing lotion 3. Primary Dressing Applied Calcium Alginate Ag 4. Secondary Dressing ABD Pad Dry Gauze 6. Support Layer Applied 3 layer compression wrap Notes netting. Electronic Signature(s) Signed: 05/03/2020 2:58:18 PM By: [name] RN, BSN Previous Signature: 05/02/2020 11:19:23 AM Version By: Sandre Kitty Entered By: [name] on 05/02/2020 11:25:59 -------------------------------------------------------------------------------- Vitals Details Patient Name: Date of Service: [name] Duke. 05/02/2020 10:15 A M Medical Record Number: [id number] Patient Account Number: [id number] Date of Birth/Sex: Treating RN: March 20, 1951 (69 y.o. [name] Primary Care Yzabelle Calles: [name] Other Clinician: Referring Idora Brosious: Treating Quinta Eimer/Extender: Doyle Askew, FRED Weeks in Treatment: 143 Vital Signs Time Taken: 10:55 Temperature (F): 98.4 Height (in):  62 Pulse (bpm): 86 Weight (lbs): 335 Respiratory Rate (breaths/min): 20 Body Mass Index (BMI): 61.3 Blood Pressure (mmHg): 156/97 Reference Range: 80 - 120 mg / dl Electronic Signature(s) Signed: 05/02/2020 5:17:00 PM By: Levan Hurst RN, BSN Entered By: Levan Hurst on 05/02/2020 10:56:09

## 2020-05-23 ENCOUNTER — Encounter (HOSPITAL_BASED_OUTPATIENT_CLINIC_OR_DEPARTMENT_OTHER): Payer: Medicare PPO | Attending: Physician Assistant | Admitting: Physician Assistant

## 2020-05-23 ENCOUNTER — Other Ambulatory Visit: Payer: Self-pay

## 2020-05-23 DIAGNOSIS — I87331 Chronic venous hypertension (idiopathic) with ulcer and inflammation of right lower extremity: Secondary | ICD-10-CM | POA: Insufficient documentation

## 2020-05-23 DIAGNOSIS — I890 Lymphedema, not elsewhere classified: Secondary | ICD-10-CM | POA: Insufficient documentation

## 2020-05-23 DIAGNOSIS — I87332 Chronic venous hypertension (idiopathic) with ulcer and inflammation of left lower extremity: Secondary | ICD-10-CM | POA: Insufficient documentation

## 2020-05-23 DIAGNOSIS — E11622 Type 2 diabetes mellitus with other skin ulcer: Secondary | ICD-10-CM | POA: Insufficient documentation

## 2020-05-23 DIAGNOSIS — L97522 Non-pressure chronic ulcer of other part of left foot with fat layer exposed: Secondary | ICD-10-CM | POA: Diagnosis not present

## 2020-05-23 DIAGNOSIS — R531 Weakness: Secondary | ICD-10-CM | POA: Diagnosis not present

## 2020-05-23 DIAGNOSIS — Z6841 Body Mass Index (BMI) 40.0 and over, adult: Secondary | ICD-10-CM | POA: Diagnosis not present

## 2020-05-23 DIAGNOSIS — L97815 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 leg with muscle involvement without evidence of necrosis: Secondary | ICD-10-CM | POA: Diagnosis not present

## 2020-05-23 DIAGNOSIS — I10 Essential (primary) hypertension: Secondary | ICD-10-CM | POA: Insufficient documentation

## 2020-05-23 DIAGNOSIS — E11621 Type 2 diabetes mellitus with foot ulcer: Secondary | ICD-10-CM | POA: Insufficient documentation

## 2020-05-23 DIAGNOSIS — Z8673 Personal history of transient ischemic attack (TIA), and cerebral infarction without residual deficits: Secondary | ICD-10-CM | POA: Diagnosis not present

## 2020-05-23 DIAGNOSIS — L97822 Non-pressure chronic ulcer of other part of left lower leg with fat layer exposed: Secondary | ICD-10-CM | POA: Diagnosis not present

## 2020-05-23 DIAGNOSIS — L97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 leg with fat layer exposed: Secondary | ICD-10-CM | POA: Diagnosis not present

## 2020-05-24 NOTE — Progress Notes (Signed)
[name], [name] ([id number]) Visit Report for 05/23/2020 Arrival Information Details Patient Name: Date of Service: [name], [name] 05/23/2020 10:15 A M Medical Record Number: [id number] Patient Account Number: [id number] Date of Birth/Sex: Treating RN: June 19, 1950 (69 y.o. [name], Lauren Primary Care Channin Agustin: [name] Other Clinician: Referring Vannia Pola: Treating Shawn Carattini/Extender: Doyle Askew, FRED Weeks in Treatment: 146 Visit Information History Since Last Visit Added or deleted any medications: No Patient Arrived: Wheel Chair Any new allergies or adverse reactions: No Arrival Time: 11:22 Had a fall or experienced change in No Accompanied By: self activities of daily living that may affect Transfer Assistance: None risk of falls: Patient Identification Verified: Yes Signs or symptoms of abuse/neglect since last visito No Secondary Verification Process Completed: Yes Hospitalized since last visit: No Patient Requires Transmission-Based Precautions: No Implantable device outside of the clinic excluding No Patient Has Alerts: Yes cellular tissue based products placed in the center Patient Alerts: R ABI= 1.01 since last visit: L ABI = .99 Has Dressing in Place as Prescribed: Yes Pain Present Now: No Electronic Signature(s) Signed: 05/24/2020 3:27:39 PM By: Rhae Hammock RN Entered By: Rhae Hammock on 05/23/2020 11:23:45 -------------------------------------------------------------------------------- Compression Therapy Details Patient Name: Date of Service: [name]. 05/23/2020 10:15 A M Medical Record Number: [id number] Patient Account Number: [id number] Date of Birth/Sex: Treating RN: [birth year] (68 y.o. [name] Primary Care Tessy Pawelski: [name] Other Clinician: Referring Jailene Cupit: Treating Eliya Geiman/Extender: Doyle Askew, FRED Weeks in Treatment: 146 Compression Therapy Performed for Wound Assessment: Wound #67  Right,Medial Lower Leg Performed By: Clinician Carlene Coria, RN Compression Type: Three Layer Post Procedure Diagnosis Same as Pre-procedure Electronic Signature(s) Signed: 05/23/2020 6:12:31 PM By: [name] RN, BSN Entered By: [name] on 05/23/2020 11:56:00 -------------------------------------------------------------------------------- Compression Therapy Details Patient Name: Date of Service: [name]. 05/23/2020 10:15 A M Medical Record Number: [id number] Patient Account Number: [id number] Date of Birth/Sex: Treating RN: 19-Jun-1950 (69 y.o. [name] Primary Care Jones Viviani: [name] Other Clinician: Referring Siraj Dermody: Treating Jacquiline Zurcher/Extender: Doyle Askew, FRED Weeks in Treatment: 146 Compression Therapy Performed for Wound Assessment: Wound #61 Left,Circumferential Lower Leg Performed By: Clinician Carlene Coria, RN Compression Type: Three Layer Post Procedure Diagnosis Same as Pre-procedure Electronic Signature(s) Signed: 05/23/2020 6:12:31 PM By: [name] RN, BSN Entered By: [name] on 05/23/2020 11:56:00 -------------------------------------------------------------------------------- Encounter Discharge Information Details Patient Name: Date of Service: [name]. 05/23/2020 10:15 A M Medical Record Number: [id number] Patient Account Number: [id number] Date of Birth/Sex: Treating RN: May 06, 1951 (69 y.o. [name] Primary Care Kurstyn Larios: [name] Other Clinician: Referring Sarahmarie Leavey: Treating Star Resler/Extender: Doyle Askew, FRED Weeks in Treatment: [phone] Encounter Discharge Information Items Discharge Condition: Stable Ambulatory Status: Wheelchair Discharge Destination: Home Transportation: Private Auto Accompanied By: self Schedule Follow-up Appointment: Yes Clinical Summary of Care: Patient Declined Electronic Signature(s) Signed: 05/23/2020 12:12:03 PM By: Rhae Hammock RN Entered By: Rhae Hammock on 05/23/2020 12:12:03 -------------------------------------------------------------------------------- Lower Extremity Assessment Details Patient Name: Date of Service: [name]. 05/23/2020 10:15 A M Medical Record Number: [id number] Patient Account Number: [id number] Date of Birth/Sex: Treating RN: 04-Sep-1950 (69 y.o. [name], Lauren Primary Care Chinedu Agustin: [name] Other Clinician: Referring Christianna Belmonte: Treating Deontrae Drinkard/Extender: Doyle Askew, FRED Weeks in Treatment: 146 Edema Assessment Assessed: [Left: No] [Right: No] Edema: [Left: Yes] [Right: Yes] Calf Left: Right: Point of Measurement: 37 cm From Medial Instep 39 cm 29 cm Ankle Left: Right: Point of Measurement: 10 cm From Medial Instep 29 cm  23 cm Vascular Assessment Pulses: Dorsalis Pedis Palpable: [Left:Yes] [Right:Yes] Posterior Tibial Palpable: [Left:Yes] [Right:Yes] Electronic Signature(s) Signed: 05/24/2020 3:27:39 PM By: Rhae Hammock RN Entered By: Rhae Hammock on 05/23/2020 11:30:52 -------------------------------------------------------------------------------- [place] Details Patient Name: Date of Service: [name]. 05/23/2020 10:15 A M Medical Record Number: [id number] Patient Account Number: [id number] Date of Birth/Sex: Treating RN: 07-09-1950 (69 y.o. [name] Primary Care Kassey Laforest: [name] Other Clinician: Referring Huda Petrey: Treating Gladies Sofranko/Extender: Doyle Askew, FRED Weeks in Treatment: [phone] Active Inactive Venous Leg Ulcer Nursing Diagnoses: Actual venous Insuffiency (use after diagnosis is confirmed) Knowledge deficit related to disease process and management Goals: Patient will maintain optimal edema control Date Initiated: 08/12/2017 Target Resolution Date: 06/06/2020 Goal Status: Active Patient/caregiver will verbalize understanding of disease process and  disease management Date Initiated: 08/12/2017 Date Inactivated: 04/21/2018 Target Resolution Date: 04/24/2018 Goal Status: Met Interventions: Assess peripheral edema status every visit. Compression as ordered Treatment Activities: Therapeutic compression applied : 08/12/2017 Notes: Wound/Skin Impairment Nursing Diagnoses: Impaired tissue integrity Knowledge deficit related to ulceration/compromised skin integrity Goals: Patient/caregiver will verbalize understanding of skin care regimen Date Initiated: 08/12/2017 Target Resolution Date: 06/06/2020 Goal Status: Active Ulcer/skin breakdown will have a volume reduction of 30% by week 4 Date Initiated: 08/05/2017 Date Inactivated: 09/30/2017 Target Resolution Date: 10/03/2017 Goal Status: Met Ulcer/skin breakdown will have a volume reduction of 50% by week 8 Date Initiated: 09/30/2017 Date Inactivated: 10/28/2017 Target Resolution Date: 10/28/2017 Goal Status: Met Interventions: Assess patient/caregiver ability to perform ulcer/skin care regimen upon admission and as needed Assess ulceration(s) every visit Provide education on ulcer and skin care Screen for HBO Treatment Activities: Patient referred to home care : 08/05/2017 Skin care regimen initiated : 08/05/2017 Topical wound management initiated : 08/05/2017 Notes: Electronic Signature(s) Signed: 05/23/2020 6:12:31 PM By: [name] RN, BSN Entered By: [name] on 05/23/2020 11:54:29 -------------------------------------------------------------------------------- Pain Assessment Details Patient Name: Date of Service: [name]. 05/23/2020 10:15 A M Medical Record Number: [id number] Patient Account Number: [id number] Date of Birth/Sex: Treating RN: [birth year] (69 y.o. [name] Primary Care Donjuan Robison: [name] Other Clinician: Referring Keionna Kinnaird: Treating Serafina Topham/Extender: Doyle Askew, FRED Weeks in Treatment: [phone] Active  Problems Location of Pain Severity and Description of Pain Patient Has Paino No Site Locations Rate the pain. Current Pain Level: 0 Pain Management and Medication Current Pain Management: Electronic Signature(s) Signed: 05/24/2020 3:27:39 PM By: Rhae Hammock RN Entered By: Rhae Hammock on 05/23/2020 11:25:05 -------------------------------------------------------------------------------- Patient/Caregiver Education Details Patient Name: Date of Service: [name] 12/8/2021andnbsp10:15 A M Medical Record Number: [id number] Patient Account Number: [id number] Date of Birth/Gender: Treating RN: [birth year] (69 y.o. [name] Primary Care Physician: [name] Other Clinician: Referring Physician: Treating Physician/Extender: Doyle Askew, FRED Weeks in Treatment: [phone] Education Assessment Education Provided To: Patient Education Topics Provided Venous: Methods: Explain/Verbal Responses: Reinforcements needed, State content correctly Wound/Skin Impairment: Methods: Explain/Verbal Responses: Reinforcements needed, State content correctly Electronic Signature(s) Signed: 05/23/2020 6:12:31 PM By: [name] RN, BSN Entered By: [name] on 05/23/2020 11:56:27 -------------------------------------------------------------------------------- Wound Assessment Details Patient Name: Date of Service: [name]. 05/23/2020 10:15 A M Medical Record Number: [id number] Patient Account Number: [id number] Date of Birth/Sex: Treating RN: 07-Aug-1950 (69 y.o. [name] Primary Care Alazay Leicht: [name] Other Clinician: Referring Kashara Blocher: Treating Rodger Giangregorio/Extender: Doyle Askew, FRED Weeks in Treatment: 146 Wound Status Wound Number: 61 Primary Venous Leg Ulcer Etiology: Wound Location: Left, Circumferential Lower Leg Wound Open Wounding Event: Gradually  Appeared Status: Date Acquired: 04/20/2019 Comorbid  Asthma, Hypertension, Peripheral Arterial Disease, Peripheral Weeks Of Treatment: 57 History: Venous Disease, Type II Diabetes, Gout, Osteoarthritis Clustered Wound: Yes Wound Measurements Length: (cm) 9 Width: (cm) 17 Depth: (cm) 0.1 Clustered Quantity: 2 Area: (cm) 120.166 Volume: (cm) 12.017 % Reduction in Area: -1493.7% % Reduction in Volume: -1493.8% Epithelialization: Large (67-100%) Tunneling: No Undermining: No Wound Description Classification: Full Thickness Without Exposed Support Structures Wound Margin: Flat and Intact Exudate Amount: Medium Exudate Type: Serous Exudate Color: amber Foul Odor After Cleansing: No Slough/Fibrino Yes Wound Bed Granulation Amount: Large (67-100%) Exposed Structure Granulation Quality: Pink, Pale Fascia Exposed: No Necrotic Amount: None Present (0%) Fat Layer (Subcutaneous Tissue) Exposed: Yes Tendon Exposed: No Muscle Exposed: No Joint Exposed: No Bone Exposed: No Treatment Notes Wound #61 (Lower Leg) Wound Laterality: Left, Circumferential Cleanser Peri-Wound Care Sween Lotion (Moisturizing lotion) Discharge Instruction: Apply moisturizing lotion as directed Topical Primary Dressing KerraCel Ag Gelling Fiber Dressing, 4x5 in (silver alginate) Discharge Instruction: Apply silver alginate to wound bed as instructed Secondary Dressing Woven Gauze Sponge, Non-Sterile 4x4 in Discharge Instruction: Apply over primary dressing as directed. ABD Pad, 5x9 Discharge Instruction: Apply over primary dressing as directed. Secured With Compression Wrap ThreePress (3 layer compression wrap) Discharge Instruction: Apply three layer compression as directed. Compression Stockings Add-Ons Electronic Signature(s) Signed: 05/24/2020 3:27:39 PM By: Rhae Hammock RN Entered By: Rhae Hammock on 05/23/2020 11:33:01 -------------------------------------------------------------------------------- Wound Assessment Details Patient  Name: Date of Service: [name]. 05/23/2020 10:15 A M Medical Record Number: [id number] Patient Account Number: [id number] Date of Birth/Sex: Treating RN: Mar 06, 1951 (69 y.o. [name], [place] Primary Care Cornel Werber: [name] Other Clinician: Referring Meenakshi Sazama: Treating [name]/Extender: Doyle Askew, FRED Weeks in Treatment: 146 Wound Status Wound Number: 64 Primary Diabetic Wound/Ulcer of the Lower Extremity Etiology: Wound Location: Left, Dorsal Foot Wound Open Wounding Event: Gradually Appeared Status: Date Acquired: 06/01/2019 Comorbid Asthma, Hypertension, Peripheral Arterial Disease, Peripheral Weeks Of Treatment: 51 History: Venous Disease, Type II Diabetes, Gout, Osteoarthritis Clustered Wound: No Wound Measurements Length: (cm) 6.5 Width: (cm) 7 Depth: (cm) 0.1 Area: (cm) 35.736 Volume: (cm) 3.574 % Reduction in Area: -21558.2% % Reduction in Volume: -7193.9% Epithelialization: Large (67-100%) Tunneling: No Undermining: No Wound Description Classification: Grade 1 Wound Margin: Flat and Intact Exudate Amount: Medium Exudate Type: Serous Exudate Color: amber Foul Odor After Cleansing: No Slough/Fibrino Yes Wound Bed Granulation Amount: Large (67-100%) Exposed Structure Granulation Quality: Pink, Pale Fascia Exposed: No Necrotic Amount: None Present (0%) Fat Layer (Subcutaneous Tissue) Exposed: No Tendon Exposed: No Muscle Exposed: No Joint Exposed: No Bone Exposed: No Limited to Skin Breakdown Treatment Notes Wound #64 (Foot) Wound Laterality: Dorsal, Left Cleanser Peri-Wound Care Sween Lotion (Moisturizing lotion) Discharge Instruction: Apply moisturizing lotion as directed Topical Primary Dressing KerraCel Ag Gelling Fiber Dressing, 4x5 in (silver alginate) Discharge Instruction: Apply silver alginate to wound bed as instructed Secondary Dressing Woven Gauze Sponge, Non-Sterile 4x4 in Discharge Instruction: Apply  over primary dressing as directed. ABD Pad, 5x9 Discharge Instruction: Apply over primary dressing as directed. Secured With Compression Wrap ThreePress (3 layer compression wrap) Discharge Instruction: Apply three layer compression as directed. Compression Stockings Add-Ons Electronic Signature(s) Signed: 05/24/2020 3:27:39 PM By: Rhae Hammock RN Entered By: Rhae Hammock on 05/23/2020 11:31:42 -------------------------------------------------------------------------------- Wound Assessment Details Patient Name: Date of Service: [name]. 05/23/2020 10:15 A M Medical Record Number: [id number] Patient Account Number: [id number] Date of Birth/Sex: Treating RN: 29-May-1951 (69 y.o. [name] Primary Care Undrea Shipes: Tamala Julian,  FRED Other Clinician: Referring Raymir Frommelt: Treating [name]/Extender: Doyle Askew, FRED Weeks in Treatment: 146 Wound Status Wound Number: 67 Primary Diabetic Wound/Ulcer of the Lower Extremity Etiology: Wound Location: Right, Medial Lower Leg Wound Open Wounding Event: Gradually Appeared Status: Date Acquired: 05/02/2020 Comorbid Asthma, Hypertension, Peripheral Arterial Disease, Peripheral Weeks Of Treatment: 3 History: Venous Disease, Type II Diabetes, Gout, Osteoarthritis Clustered Wound: No Wound Measurements Length: (cm) 1 Width: (cm) 3 Depth: (cm) 0.2 Area: (cm) 2.356 Volume: (cm) 0.471 % Reduction in Area: -66.6% % Reduction in Volume: -234% Epithelialization: Medium (34-66%) Tunneling: No Undermining: No Wound Description Classification: Grade 1 Wound Margin: Thickened Exudate Amount: Medium Exudate Type: Serous Exudate Color: amber Foul Odor After Cleansing: No Slough/Fibrino No Wound Bed Granulation Amount: Large (67-100%) Exposed Structure Granulation Quality: Red Fascia Exposed: No Necrotic Amount: None Present (0%) Fat Layer (Subcutaneous Tissue) Exposed: Yes Tendon Exposed: No Muscle  Exposed: No Joint Exposed: No Bone Exposed: No Treatment Notes Wound #67 (Lower Leg) Wound Laterality: Right, Medial Cleanser Peri-Wound Care Sween Lotion (Moisturizing lotion) Discharge Instruction: Apply moisturizing lotion as directed Topical Primary Dressing KerraCel Ag Gelling Fiber Dressing, 4x5 in (silver alginate) Discharge Instruction: Apply silver alginate to wound bed as instructed Secondary Dressing Woven Gauze Sponge, Non-Sterile 4x4 in Discharge Instruction: Apply over primary dressing as directed. ABD Pad, 5x9 Discharge Instruction: Apply over primary dressing as directed. Secured With Compression Wrap ThreePress (3 layer compression wrap) Discharge Instruction: Apply three layer compression as directed. Compression Stockings Add-Ons Electronic Signature(s) Signed: 05/24/2020 3:27:39 PM By: Rhae Hammock RN Entered By: Rhae Hammock on 05/23/2020 11:32:14 -------------------------------------------------------------------------------- Wound Assessment Details Patient Name: Date of Service: [name]. 05/23/2020 10:15 A M Medical Record Number: [id number] Patient Account Number: [id number] Date of Birth/Sex: Treating RN: 18-Mar-1951 (69 y.o. [name], Lauren Primary Care Sedric Guia: [name] Other Clinician: Referring Hydie Langan: Treating Damarius Karnes/Extender: Doyle Askew, FRED Weeks in Treatment: 146 Wound Status Wound Number: 68 Primary Diabetic Wound/Ulcer of the Lower Extremity Etiology: Wound Location: Left, Anterior Lower Leg Wound Open Wounding Event: Gradually Appeared Status: Date Acquired: 05/18/2020 Comorbid Asthma, Hypertension, Peripheral Arterial Disease, Peripheral Weeks Of Treatment: 0 History: Venous Disease, Type II Diabetes, Gout, Osteoarthritis Clustered Wound: No Wound Measurements Length: (cm) 4 Width: (cm) 2 Depth: (cm) 0.2 Area: (cm) 6.283 Volume: (cm) 1.257 % Reduction in Area: % Reduction in  Volume: Epithelialization: None Tunneling: No Undermining: No Wound Description Classification: Grade 2 Wound Margin: Distinct, outline attached Exudate Amount: Medium Exudate Type: Serosanguineous Exudate Color: red, brown Foul Odor After Cleansing: No Slough/Fibrino Yes Wound Bed Granulation Amount: Medium (34-66%) Exposed Structure Granulation Quality: Red, Pink Fascia Exposed: No Necrotic Amount: Medium (34-66%) Fat Layer (Subcutaneous Tissue) Exposed: No Necrotic Quality: Adherent Slough Tendon Exposed: No Muscle Exposed: No Joint Exposed: No Bone Exposed: No Treatment Notes Wound #68 (Lower Leg) Wound Laterality: Left, Anterior Cleanser Peri-Wound Care Sween Lotion (Moisturizing lotion) Discharge Instruction: Apply moisturizing lotion as directed Topical Primary Dressing KerraCel Ag Gelling Fiber Dressing, 4x5 in (silver alginate) Discharge Instruction: Apply silver alginate to wound bed as instructed Secondary Dressing Woven Gauze Sponge, Non-Sterile 4x4 in Discharge Instruction: Apply over primary dressing as directed. ABD Pad, 5x9 Discharge Instruction: Apply over primary dressing as directed. Secured With Compression Wrap ThreePress (3 layer compression wrap) Discharge Instruction: Apply three layer compression as directed. Compression Stockings Add-Ons Electronic Signature(s) Signed: 05/24/2020 3:27:39 PM By: Rhae Hammock RN Signed: 05/24/2020 3:27:39 PM By: Rhae Hammock RN Entered By: Rhae Hammock on 05/23/2020 11:34:45 -------------------------------------------------------------------------------- Vitals Details  Patient Name: Date of Service: CHANDRA, ASHER 05/23/2020 10:15 A M Medical Record Number: [id number] Patient Account Number: [id number] Date of Birth/Sex: Treating RN: [birth year] (69 y.o. [name], Lauren Primary Care Skylen Danielsen: [name] Other Clinician: Referring Nicklous Aburto: Treating Balen Woolum/Extender: Doyle Askew, FRED Weeks in Treatment: 146 Vital Signs Time Taken: 11:23 Temperature (F): 98.3 Height (in): 62 Pulse (bpm): 79 Weight (lbs): 335 Respiratory Rate (breaths/min): 20 Body Mass Index (BMI): 61.3 Blood Pressure (mmHg): 150/86 Capillary Blood Glucose (mg/dl): 190 Reference Range: 80 - 120 mg / dl Electronic Signature(s) Signed: 05/24/2020 3:27:39 PM By: Rhae Hammock RN Entered By: Rhae Hammock on 05/23/2020 11:24:56

## 2020-06-06 ENCOUNTER — Other Ambulatory Visit: Payer: Self-pay

## 2020-06-06 ENCOUNTER — Encounter (HOSPITAL_BASED_OUTPATIENT_CLINIC_OR_DEPARTMENT_OTHER): Payer: Medicare PPO | Admitting: Physician Assistant

## 2020-06-06 DIAGNOSIS — E11621 Type 2 diabetes mellitus with foot ulcer: Secondary | ICD-10-CM | POA: Diagnosis not present

## 2020-06-06 LAB — GLUCOSE, CAPILLARY: Glucose-Capillary: 253 mg/dL — ABNORMAL HIGH (ref 70–99)

## 2020-06-06 NOTE — Progress Notes (Addendum)
[name] ([id number]) Visit Report for 06/06/2020 Arrival Information Details Patient Name: Date of Service: [name], [name] 06/06/2020 1:00 PM Medical Record Number: [id number] Patient Account Number: [id number] Date of Birth/Sex: Treating RN: 09/04/1950 (69 y.o. Wynelle Link Primary Care Krisi Azua: Fatima Sanger Other Clinician: Referring Elya Tarquinio: Treating Jarad Barth/Extender: Massie Bougie, FRED Weeks in Treatment: 148 Visit Information History Since Last Visit Added or deleted any medications: No Patient Arrived: Wheel Chair Any new allergies or adverse reactions: No Arrival Time: 13:45 Had a fall or experienced change in No Accompanied By: self activities of daily living that may affect Transfer Assistance: None risk of falls: Patient Identification Verified: Yes Signs or symptoms of abuse/neglect since last visito No Secondary Verification Process Completed: Yes Hospitalized since last visit: No Patient Requires Transmission-Based Precautions: No Implantable device outside of the clinic excluding No Patient Has Alerts: Yes cellular tissue based products placed in the center Patient Alerts: R ABI= 1.01 since last visit: L ABI = .99 Pain Present Now: No Electronic Signature(s) Signed: 06/06/2020 4:36:38 PM By: Benjaman Kindler EMT/HBOT/SD Entered By: Benjaman Kindler on 06/06/2020 13:44:50 -------------------------------------------------------------------------------- Clinic Level of Care Assessment Details Patient Name: Date of Service: [name] 06/06/2020 1:00 PM Medical Record Number: [id number] Patient Account Number: [id number] Date of Birth/Sex: Treating RN: 14-Apr-1951 (69 y.o. Wynelle Link Primary Care Serin Thornell: Fatima Sanger Other Clinician: Referring Oleva Koo: Treating Trevious Rampey/Extender: Massie Bougie, FRED Weeks in Treatment: 148 Clinic Level of Care Assessment Items TOOL 4 Quantity Score X- 1 0 Use when only an EandM  is performed on FOLLOW-UP visit ASSESSMENTS - Nursing Assessment / Reassessment X- 1 10 Reassessment of Co-morbidities (includes updates in patient status) X- 1 5 Reassessment of Adherence to Treatment Plan ASSESSMENTS - Wound and Skin A ssessment / Reassessment []  - 0 Simple Wound Assessment / Reassessment - one wound []  - 0 Complex Wound Assessment / Reassessment - multiple wounds []  - 0 Dermatologic / Skin Assessment (not related to wound area) ASSESSMENTS - Focused Assessment []  - 0 Circumferential Edema Measurements - multi extremities []  - 0 Nutritional Assessment / Counseling / Intervention []  - 0 Lower Extremity Assessment (monofilament, tuning fork, pulses) []  - 0 Peripheral Arterial Disease Assessment (using hand held doppler) ASSESSMENTS - Ostomy and/or Continence Assessment and Care []  - 0 Incontinence Assessment and Management []  - 0 Ostomy Care Assessment and Management (repouching, etc.) PROCESS - Coordination of Care []  - 0 Simple Patient / Family Education for ongoing care X- 1 20 Complex (extensive) Patient / Family Education for ongoing care []  - 0 Staff obtains , Records, T Results / Process Orders est []  - 0 Staff telephones HHA, Nursing Homes / Clarify orders / etc []  - 0 Routine Transfer to another Facility (non-emergent condition) []  - 0 Routine Hospital Admission (non-emergent condition) []  - 0 New Admissions / / Ordering NPWT Apligraf, etc. , X- 1 20 Emergency Hospital Admission (emergent condition) []  - 0 Simple Discharge Coordination X- 1 15 Complex (extensive) Discharge Coordination PROCESS - Special Needs []  - 0 Pediatric / Minor Patient Management []  - 0 Isolation Patient Management []  - 0 Hearing / Language / Visual special needs []  - 0 Assessment of Community assistance (transportation, D/C planning, etc.) []  - 0 Additional assistance / Altered mentation []  - 0 Support Surface(s) Assessment  (bed, cushion, seat, etc.) INTERVENTIONS - Wound Cleansing / Measurement []  - 0 Simple Wound Cleansing - one wound []  - 0 Complex Wound Cleansing - multiple wounds []  -  0 Wound Imaging (photographs - any number of wounds) []  - 0 Wound Tracing (instead of photographs) []  - 0 Simple Wound Measurement - one wound []  - 0 Complex Wound Measurement - multiple wounds INTERVENTIONS - Wound Dressings []  - 0 Small Wound Dressing one or multiple wounds []  - 0 Medium Wound Dressing one or multiple wounds []  - 0 Large Wound Dressing one or multiple wounds []  - 0 Application of Medications - topical []  - 0 Application of Medications - injection INTERVENTIONS - Miscellaneous []  - 0 External ear exam []  - 0 Specimen Collection (cultures, biopsies, blood, body fluids, etc.) []  - 0 Specimen(s) / Culture(s) sent or taken to Lab for analysis []  - 0 Patient Transfer (multiple staff / / Similar devices) []  - 0 Simple Staple / Suture removal (25 or less) []  - 0 Complex Staple / Suture removal (26 or more) []  - 0 Hypo / Hyperglycemic Management (close monitor of Blood Glucose) []  - 0 Ankle / Brachial Index (ABI) - do not check if billed separately X- 1 5 Vital Signs Has the patient been seen at the hospital within the last three years: Yes Total Score: 75 Level Of Care: New/Established - Level 2 Electronic Signature(s) Signed: 06/06/2020 7:00:57 PM By: RN, BSN Entered By: on 06/06/2020 17:46:38 -------------------------------------------------------------------------------- Vitals Details Patient Name: Date of Service: . 06/06/2020 1:00 PM Medical Record Number: Patient Account Number: Date of Birth/Sex: Treating RN: [birth year] (69 y.o. Primary Care Briar Sword: Nurse, adult Other Clinician: Referring Najat Olazabal: Treating Havilah Topor/Extender: , FRED Weeks in Treatment:  148 Vital Signs Time Taken: 13:45 Temperature (F): 98.5 Height (in): 62 Pulse (bpm): 79 Weight (lbs): 335 Respiratory Rate (breaths/min): 18 Body Mass Index (BMI): 61.3 Blood Pressure (mmHg): 178/96 Reference Range: 80 - 120 mg / dl Notes Pt vitals were being taken when she c/o slight chest pain and having trouble breathing. Upon assessment pt breathing began to decline. Pt placed on 6L of O2 and Tegan Burnside was notified. Pt AOx4 however lung sounds show diminished perfusion. EMS called for transfer to ER. Electronic Signature(s) Signed: 06/06/2020 7:00:57 PM By: RN, BSN Previous Signature: 06/06/2020 2:04:56 PM Version By: 06/08/2020 EMT/HBOT/SD Entered By: Zandra Abts on 06/06/2020 17:45:04

## 2020-06-07 NOTE — Progress Notes (Signed)
[name], [name] ([id number]) Visit Report for 06/06/2020 Chief Complaint Document Details Patient Name: Date of Service: [name], [name] 06/06/2020 1:00 PM Medical Record Number: [id number] Patient Account Number: [id number] Date of Birth/Sex: Treating RN: 10-21-1950 (69 y.o. [name] Primary Care Provider: [name] Other Clinician: Referring Provider: Treating Provider/Extender: Doyle Askew, FRED Weeks in Treatment: [phone] Information Obtained from: Patient Chief Complaint Bilateral reoccurring LE ulcers Electronic Signature(s) Signed: 06/06/2020 1:46:33 PM By: [name] PA-C Entered By: [name] on 06/06/2020 13:46:33 -------------------------------------------------------------------------------- HPI Details Patient Name: Date of Service: [name] Duke. 06/06/2020 1:00 PM Medical Record Number: [id number] Patient Account Number: [id number] Date of Birth/Sex: Treating RN: 1950-11-29 (69 y.o. [name] Primary Care Provider: [name] Other Clinician: Referring Provider: Treating Provider/Extender: Doyle Askew, FRED Weeks in Treatment: 148 History of Present Illness HPI Description: this patient has been seen a couple of times before and returns with recurrent problems to her right and left lower extremity with swelling and weeping ulcerations due to not wearing her compression stockings which she had been advised to do during her last discharge, at the end of June 2018. During her last visit the patient had had normal arterial blood flow and her venous reflux study did not necessitate any surgical intervention. She was recommended compression and elevation and wound care. After prolonged treatment the patient was completely healed but she has been noncompliant with wearing or compressions.. She was here last week with an outpatient return visit planned but the patient came in a very poor general condition with altered  mental status and was rushed to the ER on my request. With a history of hypertension, diabetes, TIA and right-sided weakness she was set up for an MRI on her brain and cervical spine and was sent to [facility]. Getting an MRI done was very difficult but once the workup was done she was found not to have any spinal stenosis, epidural abscess or hematoma or discitis. This was radiculopathy to be treated as an outpatient and she was given a follow-up appointment. Today she is feeling much better alert and oriented and has come to reevaluate her bilateral lower extremity lymphedema and ulceration 03/25/2017 -- she was admitted to the hospital on 03/16/2017 and discharged on 03/18/2017 with left leg cellulitis and ulceration. She was started on vancomycin and Zosyn and x-ray showed no bony involvement. She was treated for a cellulitis with IV antibiotics changed to Rocephin and Flagyl and was discharged on oral Keflex and doxycycline to complete a 7 day course. Last hemoglobin A1c was 7.1 and her other ailments including hypertension got asthma were appropriately treated. 05/06/2017 -- she is awaiting the right size of compression stockings from  but other than that has been doing well. ====== Old notes 69 year old patient was seen one time last October and was lost to follow-up. She has recurrent problems with weeping and ulceration of her left lower extremity and has swelling of this for several years. It has been worse for the last 2 months. Past medical history is significant for diabetes mellitus type 2, hypertension, gout, morbid obesity, depressive disorders, hiatal hernia, migraines, status post knee surgery, risk of a cholecystectomy, vaginal hysterectomy and breast biopsy. She is not a smoker. As noted before she has never had a venous duplex study and an arterial ABI study was attempted but the left lower extremity was noncompressible 10/01/2016 -- had a lower extremity venous duplex  reflux evaluation which showed no evidence  of deep vein reflux in the right or left lower extremity, and no evidence of great saphenous vein reflux more than 500 ms in the right or left lower extremity, and the left small saphenous vein is incompetent but no vascular consult was recommended. review of her electronic medical records noted that the ABI was checked in July 2017 where the right ABI was normal limits and the left ABI could not be ascertained due to pain with cuff pressure but the waveforms are within normal limits. her arterial duplex study scheduled for April 27. 10/08/2016 -- the patient has various reasons for not having a compression on and for the last 3 days she has had no compression on her left lower extremity either due to pain or the lack of nursing help. She does not use her juxta lites either. 10/15/2016 -- the patient did not keep her appointment for arterial duplex study on April 27 and I have asked her to reschedule this. Her pain is out of proportion with the physical findings and she continuously fails to wear a compression wraps and cuts them off because she says she cannot tolerate the pain. She does not use her juxta lites either. 10/22/2016 -- he has rescheduled her arterial duplex study to May 21 and her pain today is a bit better. She has not been wearing her juxta lites on her right lower extremity but now understands that she needs to do this. She did tolerate the to press compression wrap on her left lower extremity 10/29/2016 --arterial duplex study is scheduled for next week and overall she has been tolerating her compression wraps and also using her juxta lites on her right lower extremity 11/05/2016 -- the right ABI was 0.95 the left was 1.03. The digit TBI is on the right was 0.83 on the left was 0.92 and she had biphasic flow through these vessels. The impression was that of normal lower extremity arterial study. 11/12/2016 -- her pain is minimal and she  is doing very well overall. 11/26/2016 -- she has got juxta lites and her insurance will not pay for additional dual layer compression stockings. She is going to order some from [place]. 05/12/2017 -- her juxta lites are very old and too big for her and these have not been helping with compression. She did get 20-30 mm compression stockings from [place] but she and her husband are unable to put these on. I believe she will benefit from bilateral Extremit-ease, compression stockings and we will measure her for these today. 05/20/2017 -- lymphedema on the left lower extremity has increased a lot and she has a open ulceration as a result of this. The right lower extremity is looking pretty good. She has decided to by the compression stockings herself and will get reimbursed by the home health, at a later date. 05/27/2017 -- her sciatica is bothering her a lot and she thought her left leg pain was caused due to the compression wrap and hence removed it and has significant lymphedema. There is no inflammation on this left lower extremity. 06/17/17 on evaluation today patient appears to be doing very well and in fact is completely healed in regard to her ulcerations. Unfortunately however she does have continued issues with lymphedema nonetheless. We did order compression garments for her unfortunately she states that the size that she received were large although we ordered medium. Obviously this means she is not getting the optimal compression. She does not have those with her today and therefore we could  not confirm and contact the company on her behalf. Nonetheless she does state that she is going to have her husband bring them by tomorrow so that we can verify and then get in touch with the company. No fevers, chills, nausea, or vomiting noted at this time. Overall patient is doing better otherwise and I'm pleased with the progress she has made. 07/01/17 on evaluation today patient appears to be doing  very well in regard to her bilateral lower extremity she does not have any openings at this point which is excellent news. Overall I'm pleased with how things have progressed up to this time. Since she is doing so well we did order her compression which we are seeing her today to ensure that it fits her properly and everything is doing well in that regard and then subsequently she will be discharged. ============ Old Notes: 03/31/16 patient presents today for evaluation concerning open wounds that she has over the left medial ankle region as well as the left dorsal foot. She has previously had this occur although it has been healed for a number of months after having this for about a year prior until her hospitalization on 01/05/16. At that point in time it appears that she was admitted to the hospital for left lower extremity cellulitis and was placed on vancomycin and Zosyn at that point. Eventually upon discharge on January 15, 2016 she was placed on doxycycline at that point in time. Later on 03/27/16 positive wound culture growing Escherichia coli this was switched to amoxicillin. Currently she tells me that she is having pain radiated to be a 7 out of 10 which can be as high as 10 out of 10 with palpation and manipulation of the wound. This wound appears to be mainly venous in nature due to the bilateral lower extremity venous stasis/lymphedema. This is definitely much worse on her left than the right side. She does have type 1 diabetes mellitus, hypertension, morbid obesity, and is wheelchair dependent.during the course of the hospital stay a blood culture was also obtained and fortunately appeared negative. She also had an x-ray of the tibia/fibula on the left which showed no acute bone abnormality. Her white blood cell count which was performed last on 03/25/16 was 7.3, hemoglobin 12.8, protein 7.1, albumin 3.0. Her urine culture appeared to be negative for any specific organisms. Patient did  have a left lower extremity venous duplex evaluation for DVT . This did not include venous reflux studies but fortunately was negative for DVT Patient also had arterial studies performed which revealed that she had a . normal ABI on the right though this was unable to be performed on the left secondary to pain that she was having around the ankle region due to the wound. However it was stated on report that she had biphasic pulses and apparently good blood flow. ========== 06/03/17 she is here in follow-up evaluation for right lower extremity ulcer. The right lower sure he has healed but she has reopened to the left medial malleolus and dorsal foot with weeping. She is waiting for new compression garments to arrive from home health, the previous compression garments were ill fitting. We will continue with compression bilaterally and follow-up in 2 weeks Readmission: 08/05/17 on evaluation today patient appears to be doing somewhat poorly in regard to her left lower extremity especially although the right lower extremity has a small area which may no longer be open. She has been having a lot of drainage from the left lower  extremity however he tells me that she has not been able to use the EXTREMIT-EASE Compression at this point. She states that she did better and was able to actually apply the Juxta-Lite compression although the wound that she has is too large and therefore really does not compress which is why she cannot wear it at this point. She has no one who can help her put it on regular basis her son can sometimes but he's not able to do it most of the time. I do believe that's why she has begun to weave and have issues as she is currently yet again. No fevers, chills, nausea, or vomiting noted at this time. Patient is no evidence of dementia. 08/12/17 on evaluation today patient appears to still be doing fairly well in regard to the draining areas/weeping areas at this point. With that being  said she unfortunately did go to the ER yesterday due to what was felt to be possibly a cellulitis. They place her on doxycycline by mouth and discharge her home. She definitely was not admitted. With that being said she states she has had more discomfort which has been unusual for her even compared to prior times and she's had infections.08/12/17 on evaluation today patient appears to still be doing fairly well in regard to the draining areas/weeping areas at this point. With that being said she unfortunately did go to the ER yesterday due to what was felt to be possibly a cellulitis. They place her on doxycycline by mouth and discharge her home. She definitely was not admitted. With that being said she states she has had more discomfort which has been unusual for her even compared to prior times and she's had infections. 08/19/17 put evaluation today patient tells me that she's been having a lot of what sounds to be neuropathic type pain in regard to her left lower extremity. She has been using over-the-counter topical bins again which some believe. That in order to apply the she actually remove the wrap we put on her last Wednesday on Thursday. Subsequently she has not had anything on compression wise since that time. The good news is a lot of the weeping areas appear to have closed at this point again I believe she would do better with compression but we are struggling to get her to actually use what she needs to at this point. No fevers, chills, nausea, or vomiting noted at this time. 09/03/17 on evaluation today patient appears to be doing okay in regard to her lower extremities in regard to the lymphedema and weeping. Fortunately she does not seem to show any signs of infection at this point she does have a little bit of weeping occurring in the right medial malleolus area. With that being said this does not appear to be too significant which is good news. 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 She has severe skin damage secondary to both of these features involving the dorsal left foot and medial left ankle and lower leg. Still has open areas in the left anterior foot. The area on the right closed over. She uses her own juxta light stockings. She does not have an arterial issue 09/16/17 on evaluation today patient actually appears to be doing excellent in regard to her bilateral lower extremity swelling. The Juxta-Lite compression wrap seem to be doing very well for her. She has not however been using the portion that goes over her foot. Her left  foot still is draining a little bit not nearly as significant as it has been in the past but still I do believe that she likely needs to utilize the full wrap including the foot portion of this will improve as well. She also has been apparently putting on a significant amount of Vaseline which also think is not helpful for her. I recommended that if she feels she needs something for moisturizer Eucerin will probably be better. 09/30/17 on evaluation today patient presents with several new open areas in regard to her left lower extremity although these appear to be minimal and mainly seem to be more moisture breakdown than anything. Fortunately she does not seem to have any evidence of infection which is great news. She has been tolerating the dressing changes without complication we are using silver alginate on the foot she has been using AB pads to have the legs and using her Juxta- Lite compression which seems to be controlling her swelling very well. Overall I'm pleased with the poor way she has progressed. 10/14/17 on evaluation today patient appears to be doing better in regard to her left lower extremity areas of weeping. She does still have some discomfort although in general this does not appear to be as macerated and I think it is progressing nicely. I do think she still  needs to wear the foot portion of her Juxta- Lite in order to get the most benefit from the wrap obviously. She states she understands. Fortunately there does not appear to be evidence of infection at this time which is great news. 10/28/17 on evaluation today patient appears to be doing excellent in regard to her left lower extremity. She has just a couple areas that are still open and seem to be causing any trouble whatsoever. For that reason I think that she is definitely headed in the right direction the spots are very tiny compared to what we have been dealing with in the past. 11/11/17 on evaluation today patient appears to have a right lateral lower extremity ulcer that has opened since I last saw her. She states this is where the home health nurse that was coming out remove the dressing without wetting the alginate first. Nonetheless I do not know if this is indeed the case or not but more importantly we have not ordered home help to be coming out for her wounds at all. I'm unsure as to why they are coming out and we're gonna have to check on this and get things situated in that regard. With that being said we currently really do not need them to be coming out as the patient has been taking care of her leg herself without complication and no issues. In fact she was doing much better prior to nursing coming out. 11/25/17 on evaluation today patient actually appears to be doing fairly well in regard to her left lower extremity swelling. In fact she has very little area of weeping at this point there's just a small spot on the lateral portion of her right leg that still has me just a little bit more concerned as far as wanting to see this clear up before I discharge her to caring for this at home. Nonetheless overall she has made excellent progress. 12/09/17 on evaluation today patient appears to be doing rather well in regard to her lower extremity edema. She does have some weeping still in the left  lower extremity although the big area we were taking care of two weeks ago  actually has closed and she has another area of weeping on the left lower extremity immediately as well is the top of her foot. She does not currently have lymphedema pumps she has been wearing her compression daily on a regular basis as directed. With that being said I think she may benefit from lymphedema pumps. 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 We had a very difficult time getting and keeping this under control. 12/23/17 on evaluation today patient actually appears to be doing a little bit more poorly in regard to her bilateral lower extremities. She has been tolerating the Juxta-Lite compression wraps. Unfortunately she has two new ulcers on the right lower extremity and left lower Trinity ulceration seems to be larger. Obviously this is not good news. She has been tolerating the dressings without complication. 12/30/17 on evaluation today patient actually appears to be doing much better in regard to her bilateral lower extremity edema. She continues to have some issues with ulcerations and in fact there appears to be one spot on each leg where the wrap may have caused a little bit of a blister which is subsequently opened up at this point is given her pain. Fortunately it does not appear to be any evidence of infection which is good news. No fevers chills noted. 01/13/18 on evaluation today patient appears to be doing rather well in regard to her bilateral lower extremities. The dressings did get kind of stuck as far as the wound beds are concerned but again I think this is mainly due to the fact that she actually seems to be showing signs of healing which is good news. She's not having as much drainage therefore she was having more of the dressing sticking.  Nonetheless overall I feel like her swelling is dramatically down compared to previous. 01/20/18 on evaluation today patient unfortunately though she's doing better in most regards has a large blister on the left anterior lower extremity where she is draining quite significantly. Subsequently this is going to need debridement today in order to see what's underneath and ensure she does not continue to trapping fluid at this location. Nonetheless No fevers, chills, nausea, or vomiting noted at this time. 01/27/18 on evaluation today patient appears to be doing rather well at this point in regard to her right lower extremity there's just a very small area that she still has open at this point. With that being said I do believe that she is tolerating the compression wraps very well in making good progress. Home health is coming out at this point to see her. Her left lower extremity on the lateral portion is actually what still mainly open and causing her some discomfort for the most part 02/10/18 on evaluation today patient actually appears to be doing very well in regard to her right lower extremity were all the ulcers appear to be completely close. In regard to the left lower extremity she does have two areas still open and some leaking from the dorsal surface of her foot but this still seems to be doing much better to me in general. 02/24/18 on evaluation today patient actually appears to be doing much better in regard to her right lower extremity this is still completely healed. Her left lower extremity is also doing much better fortunately she has no evidence of infection. The one area that  is gonna require some debridement is still on the left anterior shin. Fortunately this is not hurting her as badly today. 03/10/18 on evaluation today patient appears to be doing better in some regards although she has a little bit more open area on the dorsal foot and she also has some issues on the medial portion of  the left lower extremity which is actually new and somewhat deep. With that being said there fortunately does not appear to be any significant signs of infection which is good news. No fevers, chills, nausea, or vomiting noted at this time. In general her swelling seems to be doing fairly well which is good news. 03/31/18 on evaluation today patient presents for follow-up concerning her left lower extremity lymphedema. Unfortunately she has been doing a little bit more poorly since I last saw her in regard to the amount of weeping that she is experiencing. She's also having some increased pain in the anterior shin location. Unfortunately I do not feel like the patient is making such good progress at this point a few weeks back she was definitely doing much better. 04/07/18 on evaluation today patient actually appears to be showing some signs of improvement as far as the left lower extremity is concerned. She has been tolerating the dressing changes and it does appear that the Drawtex did better for her. With that being said unfortunately home health is stating that they cannot obtain the Drawtex going forward. Nonetheless we're gonna have to check and see what they may be able to get the alginate they were using was getting stuck in causing new areas of skin being pulled all that with and subsequently weep and calls her to worsen overall this is the first time we've seen improvement at this time. 04/14/18 on evaluation today patient actually appears to be doing rather well at this point there does not appear to be any evidence of infection at this time and she is actually doing excellent in regard to the weeping in fact she almost has no openings remaining even compared to just last week this is a dramatic improvement. No fevers chills noted 04/21/18 evaluation today patient actually appears to be doing very well. She in fact is has a small area on the posterior lower extremity location and she has  a small area on the dorsal surface of her foot that are still open both of which are very close to closing. We're hoping this will be close shortly. She brought her Juxta-Lite wrap with her today hoping that would be able to put her in it unfortunately I don't think were quite at that point yet but we're getting closer. 04/28/18 upon evaluation today patient actually appears to be doing excellent in regard to her left lower extremity ulcer. In fact the region on the posterior lower extremity actually is much smaller than previously noted. Overall I'm very happy with the progress she has made. She again did bring her Juxta-Lite although we're not quite ready for that yet. 05/11/18 upon evaluation today patient actually appears to be doing in general fairly well in regard to her left lower Trinity. The swelling is very well controlled. With that being said she has a new area on the left anterior lower extremity as well as between the first and second toes of her left foot that was not present during the last evaluation. The region of her posterior left lower extremity actually appears to be almost completely healed. T be honest I'm very pleased with o  the way that stands. Nonetheless I do believe that the lotion may be keeping the area to moist as far as her legs are concerned subsequently I'm gonna consider discontinuing that today. 05/26/18 on evaluation today patient appears to be doing rather well in regard to her left lower should be ulcers. In fact everything appears to be close except for a very small area on the left posterior lower extremity. Fortunately there does not appear to be any evidence of infection at this time. Overall very pleased with her progress. 06/02/18 and evaluation today patient actually appears to be doing very well in regard to her lower extremity ulcers. She has one small area that still continues to weep that I think may benefit her being able to justify lotion and user  Juxta-Lite wraps versus continued to wrap her. Nonetheless I think this is something we can definitely look into at this point. 06/23/18 on evaluation today patient unfortunately has openings of her bilateral lower extremities. In general she seems to be doing much worse than when I last saw her just as far as her overall health standpoint is concerned. She states that her discomfort is mainly due to neuropathy she's not having any other issues otherwise. No fevers, chills, nausea, or vomiting noted at this time. 06/30/18 on evaluation today patient actually appears to be doing a little worse in regard to her right lower extremity her left lower extremity of doing fairly well. Fortunately there is no sign of infection at this time. She has been tolerating the dressing changes without complication. Home health did not come out like they were supposed to for the appropriate wrap changes. They stated that they never received the orders from [place] which were fax. Nonetheless we will send a copy of the orders with the patient today as well. 07/07/18 on evaluation today patient appears to be doing much better in regard to lower extremities. She still has several openings bilaterally although since I last saw her her legs did show obvious signs of infection when she later saw her nurse. Subsequently a culture was obtained and she is been placed on Bactrim and Keflex. Fortunately things seem to be looking much better it does appear she likely had an infection. Again last week we'd even discussed it but again there really was not any obvious sign that she had infection therefore we held off on the antibiotics. Nonetheless I'm glad she's doing better today. 07/14/18 on evaluation today patient appears to be doing much better regarding her bilateral lower Trinity's. In fact on the right lower for me there's nothing open at this point there are some dry skin areas at the sites where she had infection. Fortunately there is  no evidence of systemic infection which is excellent news. No fevers chills noted 07/21/18 on evaluation today patient actually appears to be doing much better in regard to her left lower extremity ulcers. She is making good progress and overall I feel like she's improving each time I see her. She's having no pain I do feel like the infection is completely resolved which is excellent news. No fevers, chills, nausea, or vomiting noted at this time. 07/28/18 on evaluation today patient appears to be doing very well in regard to her left lower [employer]. Everything seems to be showing signs of improvement which is excellent news. Overall very pleased with the progress that has been made. Fortunately there's no evidence of active infection at this time also excellent news. 08/04/18 on evaluation today patient  appears to be doing more poorly in regard to her bilateral lower extremities. She has two new areas open up on the right and these were completely closed as of last week. She still has the two spots on the left which in my pinion seem to be doing better. Fortunately there's no evidence of infection again at this point. 08/11/18 on evaluation today patient actually appears to be doing very well in regard to her bilateral lower Trinity wounds that all seem to be doing better and are measures smaller today. Fortunately there's no signs of infection. No fevers, chills, nausea, or vomiting noted at this time. 08/18/18 on evaluation today patient actually appears to be doing about the same inverter bilateral lower extremities. She continues to have areas that blistering open as was drain that fortunately nothing too significant. Overall I feel like Drawtex may have done better for her however compared to the collagen. 08/25/18 on evaluation today patient appears to be doing a little bit more poorly today even compared to last time I saw her. Again I'm not exactly sure why she's making worse progress over  the past several weeks. I'm beginning to wonder if there is some kind of underlying low level infection causing this issue. I did actually take a culture from the left anterior lower extremity but it was a new wound draining quite a bit at this point. Unfortunately she also seems to be having more pain which is what also makes me worried about the possibility of infection. This is despite never erythema noted at this point. 09/01/18 on evaluation today patient actually appears to be doing a little worse even compared to last week in regard to bilateral lower extremities. She did go to the hospital on the 16th was given a dose of IV Zosyn and then discharged with a recommendation to continue with the Bactrim that I previously prescribed for her. Nonetheless she is still having a lot of discomfort she tells me as well at this time. This is definitely unfortunate. No fevers, chills, nausea, or vomiting noted at this time. 09/08/18 on evaluation today patient's bilateral lower extremities actually appear to be shown signs of improvement which is good news. Fortunately there does not appear to be any signs of active infection I think the anabiotic is helping in this regard. Overall I'm very pleased with how she is progressing. 09/15/18 patient was actually seen in ER yesterday due to her legs as well unfortunately. She states she's been having a lot of pain and discomfort as well as a lot of drainage. Upon inspection today the patient does have a lot of swelling and drainage I feel like this is more related to lymphedema and poor fluid control than it is to infection based on what I'm seeing. The physician in the emergency department also doubted that the patient was having a significant infection nonetheless I see no evidence of infection obvious at this point although I do see evidence of poor fluid control. She still not using a compression pumps, she is not elevating due to her lift chair as well as her  hospital bed being broken, and she really is not keeping her legs up as much as they should be and also has been taking off her wraps. All this combined I think has led to poor fluid control and to be honest she may be somewhat volume overloaded in general as well. I recommend that she may need to contact your physician to see if a prescription for  a diuretic would be beneficial in their opinion. As long as this is safe I think it would likely help her. 09/29/18 on evaluation today patient's left lower extremity actually appears to be doing quite a bit better. At least compared to last time that I saw her. She still has a large area where she is draining from but there's a lot of new skin speckled trout and in fact there's more new skin that there are open areas of weeping and drainage at this point. This is good news. With regard to the right lower extremity this is doing much better with the only open area that I really see being a dry spot on the right lateral ankle currently. Fortunately there's no signs of active infection at this time which is good news. No fevers, chills, nausea, or vomiting noted at this time. The patient seems somewhat stressed and overwhelmed during the visit today she was very lethargic as such. She does and she is not taking any pain medications at this point. Apparently according to her husband are also in the process of moving which is probably taking its toll on her as well. 10/06/18 on evaluation today patient appears to be doing rather well in regard to her lower extremities compared to last evaluation. Fortunately there's no signs of active infection. She tells me she did have an appointment with her primary. Nonetheless he was concerned that the wounds were somewhat deep based on pictures but we never actually saw her legs. She states that he had her somewhat worried due to the fact that she was fearing now that she was [place] have to have an amputation. With that being  said based on what I'm seeing check she looks better this week that she has the last two times I've seen her with much less drainage I'm actually pleased in this regard. That doesn't mean that she's out of the water but again I do not think what the point of talking about education at all in regard to her leg. She is very happy to hear this. She is also not having as much pain as she was having last week. 10/13/18 unfortunately on evaluation today patient still continues to have a significant amount of drainage she's not letting home health actually apply the compression dressings at this point. She's trying to use of Juxta-Lite of the top of Kerlex and the second layer of the three layer compression wrap. With that being said she just does not seem to be making as good a progress as I would expect if she was having the compression applied and in place on a regular basis. No fevers, chills, nausea, or vomiting noted at this time. 10/20/18 on evaluation today patient appears to be doing a little better in regard to her bilateral lower extremity ulcers. In fact the right lower extremity seems to be healed she doesn't even have any openings at this point left lower extremity though still somewhat macerated seems to be showing signs of new skin growth at multiple locations throughout. Fortunately there's no evidence of active infection at this time. No fevers, chills, nausea, or vomiting noted at this time. 10/27/18 on evaluation today patient appears to be doing much better in regard to her left lower Trinity ulcer. She's been tolerating the laptop complication and has minimal drainage noted at this point. Fortunately there's no signs of active infection at this time. No fevers, chills, nausea, or vomiting noted at this time. 11/03/18 on evaluation today patient actually  appears to be doing excellent in regard to her left lower extremity. She is having very little drainage at this point there does not appear  to be any significant signs of infection overall very pleased with how things have gone. She is likewise extremely pleased still and seems to be making wonderful progress week to week. I do believe antibiotics were helpful for her. Her primary care provider did place on amateur clean since I last saw her. 11/17/18 on evaluation today patient appears to be doing worse in regard to her bilateral lower extremities at this point. She is been tolerating the dressing changes without complication. With that being said she typically takes the Coban off fairly quickly upon arriving home even after being seen here in the clinic and does not allow home health reapply command as part of the dressing at home. Therefore she said no compression essentially since I last saw her as best I can tell. With that being said I think it shows and how much swelling she has in the open wounds that are noted at this point. Fortunately there's no signs of infection but unfortunately if she doesn't get this under control I think she will end up with infection and more significant issues. 11/24/18 on evaluation today patient actually appears to be doing somewhat better in regard to her bilateral lower extremities. 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 Nonetheless that is doing better currently so she should be able to attempt reinitiating the lymphedema pumps at this time. No fevers, chills, nausea, or vomiting noted at this time. 12/01/18 upon evaluation today patient's left lower extremity appears to be doing quite well unfortunately her right lower extremity is not doing nearly as well. She has been tolerating the dressing changes without complication unfortunately she did not keep a wrap on the right at this time. Nonetheless I believe this has led to increased swelling and weeping in  the world is actually much larger than during the last evaluation with her. 12/08/18 on evaluation today patient appears to be doing about the same at this point in regard to her right lower extremity. There is some more palatable to touch I'm concerned about the possibility of there being some infection although I think the main issue is she's not keeping her compression wrap on which in turn is not allowing this area to heal appropriately. 12/22/18 on evaluation today patient appears to be doing better in regard to left lower extremity unfortunately significantly worse in regard to the right lower extremity. The areas of blistering and necrotic superficial tissue have spread and again this does not really appear to be signs of infection and all she just doesn't seem to be doing nearly as well is what she has been in the past. Overall I feel like the Augmentin did absolutely nothing for her she doesn't seem to have any infection again I really didn't think so last time either is more of a potential preventative measure and hoping that this would make some difference but I think the main issue is she's not wearing her compression. She tells me she cannot wear the [place] been we put on she takes it off pretty much upon getting home. Subsequently she worshiped Juxta-Lite when I questioned her about how often she wears it this is no more than  three hours a day obviously that leaves 21 hours that she has no compression and this is obviously not doing well for her. Overall I'm concerned that if things continue to worsen she is at great risk of both infection as well as losing her leg. 01/05/19 on evaluation today patient appears to be doing well in regard to her left lower extremity which he is allowing [place] to wrap and not so well with regard to her right lower extremity which she is not allowing [place] to really wrap and keep the wrap on. She states that it hurts too badly whenever it's wrapped and she ends up  having to take it off. She's been using the Juxta-Lite she tells me up to six hours a day although I question whether or not that's really been the case to be honest. Previously she told me three hours today nonetheless obviously the legs as long as the wrap is doing great when she is not is doing much more poorly. 01/12/2019 on evaluation today patient actually appears to be doing a little better in my opinion with regard to her right lower extremity ulcer. She has a small open area on the left lower extremity unfortunately but again this I think is part of the normal fluctuation of what she is going to have to expect with regard to her legs especially when she is not using her lymphedema pumps on a regular basis. Subsequently based on what I am seeing today I think that she does seem to be doing slightly better with regard to her right lower extremity she did see her primary care provider on Monday they felt she had an infection and placed her on 2 antibiotics. Both Cipro and clindamycin. Subsequently again she seems possibly to be doing a little bit better in regards to the right lower extremity she also tells me however she has been wearing the compression wrap over the past week since I spoke with her as well that is a Kerlix and Coban wrap on the right. No fevers, chills, nausea, vomiting, or diarrhea. 01/19/2019 on evaluation today patient appears to be doing better with regard to her bilateral lower extremities especially the right. I feel like the compression has been beneficial for her which is great news. She did get a call from her primary care provider on her way here today telling her that she did have methicillin- resistant Staphylococcus aureus and he was calling in a couple new antibiotics for her including a ointment to be applied she tells me 3 times a day. With that being said this sounds like likely to be Bactroban which I think could be applied with each dressing/wrap change but I  would not be able to accommodate her applying this 3 times a day. She is in agreement with the least doing this we will add that to her orders today. 01/26/2019 on evaluation today patient actually appears to be doing much better with regard to her right lower extremity. Her left lower extremity is also doing quite well all things considering. Fortunately there is no evidence of active infection at this time. No fevers, chills, nausea, vomiting, or diarrhea. 02/02/2019 on evaluation today patient appears to be doing much better compared to her last evaluation. Little by little off like her right leg is returning more towards normal. There does not appear to be any signs of active infection and overall she seems to be doing quite well which is great news. I am very pleased in this  regard. No fevers, chills, nausea, vomiting, or diarrhea. 02/09/2019 upon evaluation today patient appears to be doing better with regard to her bilateral lower extremities. She has been tolerating the dressing changes without complication. Fortunately there is no signs of active infection at this time. No fevers, chills, nausea, vomiting, or diarrhea. 02/23/2019 on evaluation today patient actually appears to be doing quite well with regard to her bilateral lower extremities. She has been tolerating the dressing changes without complication. She is even used her pumps one time and states that she really felt like it felt good. With that being said she seems to be in good spirits and her legs appear to be doing excellent. 03/09/2019 on evaluation today patient appears to be doing well with regard to her right lower extremity there are no open wounds at this time she is having some discomfort but I feel like this is more neuropathy than anything. With regard to her left lower extremity she had several areas scattered around that she does have some weeping and drainage from but again overall she does not appear to be having any  significant issues and no evidence of infection at this time which is good news. 03/23/2019 on evaluation today patient appears to be doing well with regard to her right lower extremity which she tells me is still close she is using her juxta light here. Her left lower extremity she mainly just has an area on the foot which is still slightly draining although this also is doing great. Overall very pleased at this time. 04/06/2019 patient appears to be doing a little bit worse in regard to her left lower extremity upon evaluation today. She feels like this could be becoming infected again which she had issues with previous. Fortunately there is no signs of systemic infection but again this is always a struggle with her with her legs she will go from doing well to not so well in a very short amount of time. 04/20/2019 on evaluation today patient actually appears to be doing quite well with regard to her right lower extremity I do not see any signs of active infection at this time. Fortunately there is no fever chills noted. She is still taking the antibiotics which I prescribed for her at this point. In regard to the left lower extremity I do feel like some of these areas are better although again she still is having weeping from several locations at this time. 04/27/2019 on evaluation today patient appears to be doing about the same if not slightly worse in regard to her left lower extremity ulcers. She tells me when questioned that she has been sleeping in her Hoveround chair in fact she tells me she falls asleep without even knowing it. I think she is spending a whole lot of time in the chair and less time walking and moving around which is not good for her legs either. On top of that she is in a seated position which is also the worst position she is not really elevating her legs and she is also not using her lymphedema pumps. All this is good to contribute to worsening of her condition in  general. 05/18/2019 on evaluation today patient appears to be doing well with regard to her lower extremity on the right in fact this is showing no signs of any open wounds at this time. On the left she is continuing to have issues with areas that do drain. Some of the regions have healed and there are  couple areas that have reopened. She did go to the ER per the patient according to recommendations from the home health nurse due to what she was seen when she came out on 05/13/2019. Subsequently she felt like the patient needed to go to the hospital due to the fact that again she was having "milky white discharge" from her leg. Nonetheless she had and then was placed on doxycycline and subsequently seems to be doing better. 06/01/2019 upon evaluation today patient appears to be doing really in my opinion about the same. I do not see any signs of active infection which is good news. Overall she still has wounds over the bilateral lower extremities she has reopened on the right but this appears to be more of a crack where there is weeping/edema coming from the region. I do not see any evidence of infection at either site based on what I visualized today. 07/13/2019 upon evaluation today patient appears to be doing a little worse compared to last time I saw her. She since has been in the hospital from 06/21/2019 through 06/29/2019. This was secondary to having Covid. During that time they did apply lotion to her legs which unfortunately has caused her to develop a myriad of open wounds on her lower extremities. Her legs do appear to be doing better as far as the overall appearance is concerned but nonetheless she does have more open and weeping areas. 07/27/2019 upon evaluation today patient appears to be doing more poorly to be honest in regard to her left lower extremity in particular. There is no signs of systemic infection although I do believe she may have local infection. She notes she has been having  a lot of blue/green drainage which is consistent potentially with Pseudomonas. That may be something that we need to consider here as well. The doxycycline does not seem to have been helping. 08/03/2019 upon evaluation today patient appears to be doing a little better in my opinion compared to last week's evaluation. Her culture I did review today and she is on appropriate medications to help treat the Enterobacter that was noted. Overall I feel like that is good news. With that being said she is unfortunately continuing to have a lot of drainage and though it is doing better I still think she has a long ways to go to get things dried up in general. Fortunately there is no signs of systemic infection. 08/10/2019 upon evaluation today patient appears to be doing may be slightly better in regard to her left lower extremity the right lower extremity is doing much better. Fortunately there is no signs of infection right now which is good news. No fevers, chills, nausea, vomiting, or diarrhea. 08/24/2019 on evaluation today patient appears to be doing slightly better in regard to her lower extremities. The left lower extremity seems to be healed the right lower extremity is doing better though not completely healed as far as the openings are concerned. She has some generalized issues here with edema and weeping secondary to her lymphedema though again I do believe this is little bit drier compared to prior weeks evaluations. In general I am very pleased with how things seem to be progressing. No fevers, chills, nausea, vomiting, or diarrhea. 08/31/2019 upon evaluation today patient actually seems to making some progress here with regard to the left lower extremity in particular. She has been tolerating the dressing changes without complication. Fortunately there is no signs of active infection at this time. No fevers, chills, nausea,  vomiting, or diarrhea. She did see Dr. Doren Custard and he did note that she did have  a issue with the left great saphenous vein and the small saphenous vein in the leg. With that being said he was concerned about the possibility of laser ablation not being extremely successful. He also mentioned a small risk of DVT associated with the procedure. However if the wounds do not continue to improve he stated that that would probably be the way to go. Fortunately the patient's legs do seem to be doing much better. 09/07/2019 upon evaluation today patient appears to be doing better with regard to her lower extremities. She has been tolerating the dressing changes without complication. With that being said she is showing signs of improvement and overall very pleased. There are some areas on her leg that I think we do need to debride we discussed this last week the patient is in agreement with doing that as long as it does not hurt too badly. 09/14/2019 upon evaluation today patient appears to be doing decently well with regard to her left lower extremity. She is not having near as much weeping as she has had in the past things seem to be drying up which is good news. There is no signs of active infection at this time. 09/21/19 upon evaluation today patient appears to be doing better in regard overall to her bilateral lower extremities. She again has less open than she did previous and each week I feel like this is getting better. Fortunately there is no signs of active infection at this time. No fevers, chills, nausea, vomiting, or diarrhea. 09/28/2019 upon evaluation today patient actually appears to be showing signs of improvement with regard to her left lower extremity. Unfortunately the right medial lower extremity around the ankle region has reopened to some degree but this appears to be minimal still which is good news. There is no signs of active infection at this time which is also good news. 10/12/2019 upon evaluation today patient appears to be doing okay with regard to her bilateral  lower extremities today. The right is a little bit worse then last evaluation 2 weeks ago. The left is actually doing a little better in my opinion. Overall there is no signs of active infection at this time that I see. Obviously that something we have to keep a close eye on she is very prone to this with the significant and multiple openings that she has over the bilateral lower extremities. 10/19/2019 upon evaluation today patient appears to be doing about the best that I have seen her in quite some time. She has been tolerating the dressing changes without complication. There does not appear to be any signs of active infection and overall I am extremely happy with the way her legs appeared. She is drying up quite nicely and overall is having less pain. 11/09/2019 upon evaluation today patient appears to be doing better in regard to her wounds. She seems to be drying up more and more each time I see her this is just taking a very long time. Fortunately there is no signs of active infection at this time. 11/23/2019 upon evaluation today patient actually appears to be doing excellent in regard to her lower extremities at this point compared to where she has been. Fortunately there is no signs of active infection at this time. She did go to the hospital last week for nausea and vomiting completely unrelated to her wounds. Fortunately she is doing better she was  given some Reglan and got better. She had associated abdominal pain but they never found out what was going on. 12/07/2019 upon evaluation today patient appears to be doing well for the most part in regard to her legs. She unfortunately has not been keeping the Cob[place] portion of her wraps on therefore the compression has not really been sufficient for what it is supposed to be. Nonetheless she tells me that it just hurt too bad therefore she removed it. 12/21/2019 upon evaluation today patient actually appears to be doing quite well with regard to her  legs. I do feel like she has been making progress which is great news and overall there is no signs of active infection at this time. No fevers, chills, nausea, vomiting, or diarrhea. 01/04/2020 upon evaluation today patient presents for follow-up concerning her lower extremity edema bilaterally. She still has open wounds she has not been using her lymphedema pumps. She is also not been utilizing her compression wraps appropriately she tends to unwrap them, take them off, or states that they hurt. Obviously the reason they hurt is because her legs start to swell but the issue is if she would use her compression/lymphedema pumps regularly she would not swell and she would have the pain. Nonetheless she has not even picked them up once honestly over the past several months and may be even as much as in the past year based on my opinion and what have seen. She tells me today that after last week when I talked about this with her specifically actually that was 2 weeks ago that she "forgot". 8//21 on evaluation today patient appears to be doing a little better in regard to her legs bilaterally. Fortunately there is no signs of active infection at this time. She tells me that she used her lymphedema pumps all of one time over the past 2 weeks since I last saw her. She tells me that she has been too busy in order to continue to use these. 02/01/2020 on evaluation today patient appears to be doing some better in regard to her wounds in general in her legs. We felt the right was healed although is not completely it does appear to be doing better she tells me she has been using her lymphedema pumps that she has had this six times since I last saw her. Obviously the more she does that the better she would do my opinion 02/15/2020 upon evaluation today patient appears to be doing about the same in regard to her legs. She tells me that she is pumping I'm still not sure how much she does to be perfectly honest. However  even if she does a little bit here and there I guess that is better than nothing. Fortunately there is no sign of active infection at this time which is great news. No fevers, chills, nausea, vomiting, or diarrhea. 02/29/2020 on evaluation today patient actually appears to be doing quite well all things considered this week. She has been tolerating the dressing changes without complication. Fortunately there is no signs of active infection at this time. No fevers, chills, nausea, vomiting, or diarrhea. 03/14/2020 upon evaluation today patient appears to be doing really about the same in regard to her legs. There is no signs of improvement overall and she as noted from home health does not appear to be elevating her legs he can get into her lift chair. There is too much stuff piled up on it the patient tells me. She also tells me  she cannot really use her pumps effectively due to the fact that she cannot have any space to get them on. Finally she is also not really elevating her legs because she is not sleeping in her bed she is sleeping in her chair currently and again overall I think everything that she is done in combination has been exactly the wrong thing for what she needs for her legs. 04/04/2020 upon evaluation today patient appears to be doing well at this time with regard to her legs. She is actually been pumping, keeping her wraps on, and to be honest she seems to be doing dramatically better the right leg is excellent the left leg is also excellent and measuring much smaller than previous. 04/18/2020 upon evaluation today patient actually is continue to make good progress in regard to her lower extremities bilaterally. Everything is improving and less wet that has been in the past overall I am extremely pleased with where things stand and I think that she is making great progress. The patient tells me she still continue to use her compression pumps 05/02/2020 on evaluation today patient appears  to be doing well at this time in regard to her left leg which is showing signs of drying up. With that being said she does have a lot of lymphedema type crusty skin around the toes of her left foot and the right medial ankle which has opened at this point. Fortunately there is no signs of active infection systemically at this point or even locally for that matter. 05/23/2020 on evaluation today patient appears to be doing well with regard to her lower extremities. Fortunately there is no signs of active infection at this time. No fever chills noted. She has been very depressed however she tells me. 06/06/2020 patient came in today for evaluation in regard to her bilateral lower extremity ulcerations. With that being said she came in feeling okay and actually laughing and joking around with the staff checking her in. Subsequently however she had a coughing spell and following the coughing spell it was a dramatic conversion from being jovial and joking around to being extremely short of breath her vital signs actually dropped in regard to her blood pressure from around 175 to down around 025 for systolic and from around 96 diastolic down to around 70. With that being said she also accompanied this with an increase in her respiratory rate which was also quite significant. Nonetheless I actually upon going into see her was extremely worried we called EMS to have her transported to the ER for further evaluation and treatment. She continued until EMS got here to be extremely short of breath even on 6 L of oxygen. Her oxygen saturation did come up to 99% but overall it was only 95 before which was not terrible she did feel like the oxygen helped her feel somewhat better however. She has a history of asthma but again even the coughing spell really should not have done this degree of alteration in her demeanor from where she was just before to after the coughing spell. She tells [place] however she has been feeling  somewhat abnormal since Friday although she could not really pinpoint exactly what was going on. Electronic Signature(s) Signed: 06/06/2020 2:23:06 PM By: [name] PA-C Entered By: [name] on 06/06/2020 14:23:05 -------------------------------------------------------------------------------- Physical Exam Details Patient Name: Date of Service: [name], [name] 06/06/2020 1:00 PM Medical Record Number: [id number] Patient Account Number: [id number] Date of Birth/Sex: Treating RN: 1950/11/03 (69  y.o. [name] Primary Care Provider: [name] Other Clinician: Referring Provider: Treating Provider/Extender: Doyle Askew, FRED Weeks in Treatment: 43 Constitutional Well-nourished and well-hydrated in no acute distress. Respiratory normal breathing without difficulty. Psychiatric this patient is able to make decisions and demonstrates good insight into disease process. Alert and Oriented x 3. pleasant and cooperative. Notes On inspection patient appears to be very much lethargic at this point and I am concerned about the fact that to be honest I think she is having a very difficult time breathing despite her oxygen saturation being okay her blood pressure is somewhat abnormal today. She had a fairly significant change from feeling pretty good to significantly worsen this accompanied by an increase in her respiratory rate from around 18 to around 24 and that was at best. She appeared to be somewhat significantly ill and I did feel like she needed to get to the ER for further evaluation and treatment EMS was notified. Electronic Signature(s) Signed: 06/06/2020 2:23:49 PM By: [name] PA-C Entered By: [name] on 06/06/2020 14:23:48 -------------------------------------------------------------------------------- Problem List Details Patient Name: Date of Service: [name] Duke. 06/06/2020 1:00 PM Medical Record Number: [id number] Patient  Account Number: [id number] Date of Birth/Sex: Treating RN: Aug 11, 1950 (69 y.o. [name] Primary Care Provider: [name] Other Clinician: Referring Provider: Treating Provider/Extender: Doyle Askew, FRED Weeks in Treatment: [phone] Active Problems ICD-10 Encounter Code Description Active Date MDM Diagnosis E11.622 Type 2 diabetes mellitus with other skin ulcer 08/05/2017 No Yes I89.0 Lymphedema, not elsewhere classified 08/05/2017 No Yes I87.331 Chronic venous hypertension (idiopathic) with ulcer and inflammation of right 08/05/2017 No Yes lower extremity I87.332 Chronic venous hypertension (idiopathic) with ulcer and inflammation of left 08/05/2017 No Yes lower extremity L97.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 leg with fat layer 08/05/2017 No Yes exposed L97.822 Non-pressure chronic ulcer of other part of left lower leg with fat layer exposed2/20/2019 No Yes L97.522 Non-pressure chronic ulcer of other part of left foot with fat layer exposed 06/01/2019 No Yes I10 Essential (primary) hypertension 08/05/2017 No Yes E66.01 Morbid (severe) obesity due to excess calories 08/05/2017 No Yes F41.8 Other specified anxiety disorders 08/05/2017 No Yes R53.1 Weakness 08/05/2017 No Yes Inactive Problems Resolved Problems Electronic Signature(s) Signed: 06/06/2020 1:46:14 PM By: [name] PA-C Entered By: [name] on 06/06/2020 13:46:13 -------------------------------------------------------------------------------- Progress Note Details Patient Name: Date of Service: [name] Duke. 06/06/2020 1:00 PM Medical Record Number: [id number] Patient Account Number: [id number] Date of Birth/Sex: Treating RN: 03-23-1951 (69 y.o. [name] Primary Care Provider: [name] Other Clinician: Referring Provider: Treating Provider/Extender: Doyle Askew, FRED Weeks in Treatment: [phone] Subjective Chief Complaint Information obtained  from Patient Bilateral reoccurring LE ulcers History of Present Illness (HPI) this patient has been seen a couple of times before and returns with recurrent problems to her right and left lower extremity with swelling and weeping ulcerations due to not wearing her compression stockings which she had been advised to do during her last discharge, at the end of June 2018. During her last visit the patient had had normal arterial blood flow and her venous reflux study did not necessitate any surgical intervention. She was recommended compression and elevation and wound care. After prolonged treatment the patient was completely healed but she has been noncompliant with wearing or compressions.. She was here last week with an outpatient return visit planned but the patient came in a very poor general  condition with altered mental status and was rushed to the ER on my request. With a history of hypertension, diabetes, TIA and right-sided weakness she was set up for an MRI on her brain and cervical spine and was sent to [facility]. Getting an MRI done was very difficult but once the workup was done she was found not to have any spinal stenosis, epidural abscess or hematoma or discitis. This was radiculopathy to be treated as an outpatient and she was given a follow-up appointment. Today she is feeling much better alert and oriented and has come to reevaluate her bilateral lower extremity lymphedema and ulceration 03/25/2017 -- she was admitted to the hospital on 03/16/2017 and discharged on 03/18/2017 with left leg cellulitis and ulceration. She was started on vancomycin and Zosyn and x-ray showed no bony involvement. She was treated for a cellulitis with IV antibiotics changed to Rocephin and Flagyl and was discharged on oral Keflex and doxycycline to complete a 7 day course. Last hemoglobin A1c was 7.1 and her other ailments including hypertension got asthma were appropriately treated. 05/06/2017 -- she  is awaiting the right size of compression stockings from [place] but other than that has been doing well. ====== Old notes 69 year old patient was seen one time last October and was lost to follow-up. She has recurrent problems with weeping and ulceration of her left lower extremity and has swelling of this for several years. It has been worse for the last 2 months. Past medical history is significant for diabetes mellitus type 2, hypertension, gout, morbid obesity, depressive disorders, hiatal hernia, migraines, status post knee surgery, risk of a cholecystectomy, vaginal hysterectomy and breast biopsy. She is not a smoker. As noted before she has never had a venous duplex study and an arterial ABI study was attempted but the left lower extremity was noncompressible 10/01/2016 -- had a lower extremity venous duplex reflux evaluation which showed no evidence of deep vein reflux in the right or left lower extremity, and no evidence of great saphenous vein reflux more than 500 ms in the right or left lower extremity, and the left small saphenous vein is incompetent but no vascular consult was recommended. review of her electronic medical records noted that the ABI was checked in July 2017 where the right ABI was normal limits and the left ABI could not be ascertained due to pain with cuff pressure but the waveforms are within normal limits. her arterial duplex study scheduled for April 27. 10/08/2016 -- the patient has various reasons for not having a compression on and for the last 3 days she has had no compression on her left lower extremity either due to pain or the lack of nursing help. She does not use her juxta lites either. 10/15/2016 -- the patient did not keep her appointment for arterial duplex study on April 27 and I have asked her to reschedule this. Her pain is out of proportion with the physical findings and she continuously fails to wear a compression wraps and cuts them off because  she says she cannot tolerate the pain. She does not use her juxta lites either. 10/22/2016 -- he has rescheduled her arterial duplex study to May 21 and her pain today is a bit better. She has not been wearing her juxta lites on her right lower extremity but now understands that she needs to do this. She did tolerate the to press compression wrap on her left lower extremity 10/29/2016 --arterial duplex study is scheduled for next week and  overall she has been tolerating her compression wraps and also using her juxta lites on her right lower extremity 11/05/2016 -- the right ABI was 0.95 the left was 1.03. The digit TBI is on the right was 0.83 on the left was 0.92 and she had biphasic flow through these vessels. The impression was that of normal lower extremity arterial study. 11/12/2016 -- her pain is minimal and she is doing very well overall. 11/26/2016 -- she has got juxta lites and her insurance will not pay for additional dual layer compression stockings. She is going to order some from [place]. 05/12/2017 -- her juxta lites are very old and too big for her and these have not been helping with compression. She did get 20-30 mm compression stockings from [place] but she and her husband are unable to put these on. I believe she will benefit from bilateral Extremit-ease, compression stockings and we will measure her for these today. 05/20/2017 -- lymphedema on the left lower extremity has increased a lot and she has a open ulceration as a result of this. The right lower extremity is looking pretty good. She has decided to by the compression stockings herself and will get reimbursed by the home health, at a later date. 05/27/2017 -- her sciatica is bothering her a lot and she thought her left leg pain was caused due to the compression wrap and hence removed it and has significant lymphedema. There is no inflammation on this left lower extremity. 06/17/17 on evaluation today patient appears to be  doing very well and in fact is completely healed in regard to her ulcerations. Unfortunately however she does have continued issues with lymphedema nonetheless. We did order compression garments for her unfortunately she states that the size that she received were large although we ordered medium. Obviously this means she is not getting the optimal compression. She does not have those with her today and therefore we could not confirm and contact the company on her behalf. Nonetheless she does state that she is going to have her husband bring them by tomorrow so that we can verify and then get in touch with the company. No fevers, chills, nausea, or vomiting noted at this time. Overall patient is doing better otherwise and I'm pleased with the progress she has made. 07/01/17 on evaluation today patient appears to be doing very well in regard to her bilateral lower extremity she does not have any openings at this point which is excellent news. Overall I'm pleased with how things have progressed up to this time. Since she is doing so well we did order her compression which we are seeing her today to ensure that it fits her properly and everything is doing well in that regard and then subsequently she will be discharged. ============ Old Notes: 03/31/16 patient presents today for evaluation concerning open wounds that she has over the left medial ankle region as well as the left dorsal foot. She has previously had this occur although it has been healed for a number of months after having this for about a year prior until her hospitalization on 01/05/16. At that point in time it appears that she was admitted to the hospital for left lower extremity cellulitis and was placed on vancomycin and Zosyn at that point. Eventually upon discharge on January 15, 2016 she was placed on doxycycline at that point in time. Later on 03/27/16 positive wound culture growing Escherichia coli this was switched to amoxicillin.  Currently she tells me that she is  having pain radiated to be a 7 out of 10 which can be as high as 10 out of 10 with palpation and manipulation of the wound. This wound appears to be mainly venous in nature due to the bilateral lower extremity venous stasis/lymphedema. This is definitely much worse on her left than the right side. She does have type 1 diabetes mellitus, hypertension, morbid obesity, and is wheelchair dependent.during the course of the hospital stay a blood culture was also obtained and fortunately appeared negative. She also had an x-ray of the tibia/fibula on the left which showed no acute bone abnormality. Her white blood cell count which was performed last on 03/25/16 was 7.3, hemoglobin 12.8, protein 7.1, albumin 3.0. Her urine culture appeared to be negative for any specific organisms. Patient did have a left lower extremity venous duplex evaluation for DVT . This did not include venous reflux studies but fortunately was negative for DVT Patient also had arterial studies performed which revealed that she had a . normal ABI on the right though this was unable to be performed on the left secondary to pain that she was having around the ankle region due to the wound. However it was stated on report that she had biphasic pulses and apparently good blood flow. ========== 06/03/17 she is here in follow-up evaluation for right lower extremity ulcer. The right lower sure he has healed but she has reopened to the left medial malleolus and dorsal foot with weeping. She is waiting for new compression garments to arrive from home health, the previous compression garments were ill fitting. We will continue with compression bilaterally and follow-up in 2 weeks Readmission: 08/05/17 on evaluation today patient appears to be doing somewhat poorly in regard to her left lower extremity especially although the right lower extremity has a small area which may no longer be open. She has been having  a lot of drainage from the left lower extremity however he tells me that she has not been able to use the EXTREMIT-EASE Compression at this point. She states that she did better and was able to actually apply the Juxta-Lite compression although the wound that she has is too large and therefore really does not compress which is why she cannot wear it at this point. She has no one who can help her put it on regular basis her son can sometimes but he's not able to do it most of the time. I do believe that's why she has begun to weave and have issues as she is currently yet again. No fevers, chills, nausea, or vomiting noted at this time. Patient is no evidence of dementia. 08/12/17 on evaluation today patient appears to still be doing fairly well in regard to the draining areas/weeping areas at this point. With that being said she unfortunately did go to the ER yesterday due to what was felt to be possibly a cellulitis. They place her on doxycycline by mouth and discharge her home. She definitely was not admitted. With that being said she states she has had more discomfort which has been unusual for her even compared to prior times and she's had infections.08/12/17 on evaluation today patient appears to still be doing fairly well in regard to the draining areas/weeping areas at this point. With that being said she unfortunately did go to the ER yesterday due to what was felt to be possibly a cellulitis. They place her on doxycycline by mouth and discharge her home. She definitely was not admitted. With that being  said she states she has had more discomfort which has been unusual for her even compared to prior times and she's had infections. 08/19/17 put evaluation today patient tells me that she's been having a lot of what sounds to be neuropathic type pain in regard to her left lower extremity. She has been using over-the-counter topical bins again which some believe. That in order to apply the she actually  remove the wrap we put on her last Wednesday on Thursday. Subsequently she has not had anything on compression wise since that time. The good news is a lot of the weeping areas appear to have closed at this point again I believe she would do better with compression but we are struggling to get her to actually use what she needs to at this point. No fevers, chills, nausea, or vomiting noted at this time. 09/03/17 on evaluation today patient appears to be doing okay in regard to her lower extremities in regard to the lymphedema and weeping. Fortunately she does not seem to show any signs of infection at this point she does have a little bit of weeping occurring in the right medial malleolus area. With that being said this does not appear to be too significant which is good news. 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 She has severe skin damage secondary to both of these features involving the dorsal left foot and medial left ankle and lower leg. Still has open areas in the left anterior foot. The area on the right closed over. She uses her own juxta light stockings. She does not have an arterial issue 09/16/17 on evaluation today patient actually appears to be doing excellent in regard to her bilateral lower extremity swelling. The Juxta-Lite compression wrap seem to be doing very well for her. She has not however been using the portion that goes over her foot. Her left foot still is draining a little bit not nearly as significant as it has been in the past but still I do believe that she likely needs to utilize the full wrap including the foot portion of this will improve as well. She also has been apparently putting on a significant amount of Vaseline which also think is not helpful for her. I recommended that if she feels she needs something for moisturizer Eucerin will probably be better. 09/30/17 on evaluation today patient  presents with several new open areas in regard to her left lower extremity although these appear to be minimal and mainly seem to be more moisture breakdown than anything. Fortunately she does not seem to have any evidence of infection which is great news. She has been tolerating the dressing changes without complication we are using silver alginate on the foot she has been using AB pads to have the legs and using her Juxta- Lite compression which seems to be controlling her swelling very well. Overall I'm pleased with the poor way she has progressed. 10/14/17 on evaluation today patient appears to be doing better in regard to her left lower extremity areas of weeping. She does still have some discomfort although in general this does not appear to be as macerated and I think it is progressing nicely. I do think she still needs to wear the foot portion of her Juxta- Lite in order to get the most benefit from the wrap obviously. She states she understands. Fortunately there does not appear to be evidence of infection at this time which is great news.  10/28/17 on evaluation today patient appears to be doing excellent in regard to her left lower extremity. She has just a couple areas that are still open and seem to be causing any trouble whatsoever. For that reason I think that she is definitely headed in the right direction the spots are very tiny compared to what we have been dealing with in the past. 11/11/17 on evaluation today patient appears to have a right lateral lower extremity ulcer that has opened since I last saw her. She states this is where the home health nurse that was coming out remove the dressing without wetting the alginate first. Nonetheless I do not know if this is indeed the case or not but more importantly we have not ordered home help to be coming out for her wounds at all. I'm unsure as to why they are coming out and we're gonna have to check on this and get things situated in that  regard. With that being said we currently really do not need them to be coming out as the patient has been taking care of her leg herself without complication and no issues. In fact she was doing much better prior to nursing coming out. 11/25/17 on evaluation today patient actually appears to be doing fairly well in regard to her left lower extremity swelling. In fact she has very little area of weeping at this point there's just a small spot on the lateral portion of her right leg that still has me just a little bit more concerned as far as wanting to see this clear up before I discharge her to caring for this at home. Nonetheless overall she has made excellent progress. 12/09/17 on evaluation today patient appears to be doing rather well in regard to her lower extremity edema. She does have some weeping still in the left lower extremity although the big area we were taking care of two weeks ago actually has closed and she has another area of weeping on the left lower extremity immediately as well is the top of her foot. She does not currently have lymphedema pumps she has been wearing her compression daily on a regular basis as directed. With that being said I think she may benefit from lymphedema pumps. 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 We had a very difficult time getting and keeping this under control. 12/23/17 on evaluation today patient actually appears to be doing a little bit more poorly in regard to her bilateral lower extremities. She has been tolerating the Juxta-Lite compression wraps. Unfortunately she has two new ulcers on the right lower extremity and left lower Trinity ulceration seems to be larger. Obviously this is not good news. She has been tolerating the dressings without complication. 12/30/17 on evaluation today patient  actually appears to be doing much better in regard to her bilateral lower extremity edema. She continues to have some issues with ulcerations and in fact there appears to be one spot on each leg where the wrap may have caused a little bit of a blister which is subsequently opened up at this point is given her pain. Fortunately it does not appear to be any evidence of infection which is good news. No fevers chills noted. 01/13/18 on evaluation today patient appears to be doing rather well in regard to her bilateral lower extremities. The dressings did  get kind of stuck as far as the wound beds are concerned but again I think this is mainly due to the fact that she actually seems to be showing signs of healing which is good news. She's not having as much drainage therefore she was having more of the dressing sticking. Nonetheless overall I feel like her swelling is dramatically down compared to previous. 01/20/18 on evaluation today patient unfortunately though she's doing better in most regards has a large blister on the left anterior lower extremity where she is draining quite significantly. Subsequently this is going to need debridement today in order to see what's underneath and ensure she does not continue to trapping fluid at this location. Nonetheless No fevers, chills, nausea, or vomiting noted at this time. 01/27/18 on evaluation today patient appears to be doing rather well at this point in regard to her right lower extremity there's just a very small area that she still has open at this point. With that being said I do believe that she is tolerating the compression wraps very well in making good progress. Home health is coming out at this point to see her. Her left lower extremity on the lateral portion is actually what still mainly open and causing her some discomfort for the most part 02/10/18 on evaluation today patient actually appears to be doing very well in regard to her right lower extremity  were all the ulcers appear to be completely close. In regard to the left lower extremity she does have two areas still open and some leaking from the dorsal surface of her foot but this still seems to be doing much better to me in general. 02/24/18 on evaluation today patient actually appears to be doing much better in regard to her right lower extremity this is still completely healed. Her left lower extremity is also doing much better fortunately she has no evidence of infection. The one area that is gonna require some debridement is still on the left anterior shin. Fortunately this is not hurting her as badly today. 03/10/18 on evaluation today patient appears to be doing better in some regards although she has a little bit more open area on the dorsal foot and she also has some issues on the medial portion of the left lower extremity which is actually new and somewhat deep. With that being said there fortunately does not appear to be any significant signs of infection which is good news. No fevers, chills, nausea, or vomiting noted at this time. In general her swelling seems to be doing fairly well which is good news. 03/31/18 on evaluation today patient presents for follow-up concerning her left lower extremity lymphedema. Unfortunately she has been doing a little bit more poorly since I last saw her in regard to the amount of weeping that she is experiencing. She's also having some increased pain in the anterior shin location. Unfortunately I do not feel like the patient is making such good progress at this point a few weeks back she was definitely doing much better. 04/07/18 on evaluation today patient actually appears to be showing some signs of improvement as far as the left lower extremity is concerned. She has been tolerating the dressing changes and it does appear that the Drawtex did better for her. With that being said unfortunately home health is stating that they cannot obtain the  Drawtex going forward. Nonetheless we're gonna have to check and see what they may be able to get the alginate they were using was  getting stuck in causing new areas of skin being pulled all that with and subsequently weep and calls her to worsen overall this is the first time we've seen improvement at this time. 04/14/18 on evaluation today patient actually appears to be doing rather well at this point there does not appear to be any evidence of infection at this time and she is actually doing excellent in regard to the weeping in fact she almost has no openings remaining even compared to just last week this is a dramatic improvement. No fevers chills noted 04/21/18 evaluation today patient actually appears to be doing very well. She in fact is has a small area on the posterior lower extremity location and she has a small area on the dorsal surface of her foot that are still open both of which are very close to closing. We're hoping this will be close shortly. She brought her Juxta-Lite wrap with her today hoping that would be able to put her in it unfortunately I don't think were quite at that point yet but we're getting closer. 04/28/18 upon evaluation today patient actually appears to be doing excellent in regard to her left lower extremity ulcer. In fact the region on the posterior lower extremity actually is much smaller than previously noted. Overall I'm very happy with the progress she has made. She again did bring her Juxta-Lite although we're not quite ready for that yet. 05/11/18 upon evaluation today patient actually appears to be doing in general fairly well in regard to her left lower Trinity. The swelling is very well controlled. With that being said she has a new area on the left anterior lower extremity as well as between the first and second toes of her left foot that was not present during the last evaluation. The region of her posterior left lower extremity actually appears to be  almost completely healed. T be honest I'm very pleased with o the way that stands. Nonetheless I do believe that the lotion may be keeping the area to moist as far as her legs are concerned subsequently I'm gonna consider discontinuing that today. 05/26/18 on evaluation today patient appears to be doing rather well in regard to her left lower should be ulcers. In fact everything appears to be close except for a very small area on the left posterior lower extremity. Fortunately there does not appear to be any evidence of infection at this time. Overall very pleased with her progress. 06/02/18 and evaluation today patient actually appears to be doing very well in regard to her lower extremity ulcers. She has one small area that still continues to weep that I think may benefit her being able to justify lotion and user Juxta-Lite wraps versus continued to wrap her. Nonetheless I think this is something we can definitely look into at this point. 06/23/18 on evaluation today patient unfortunately has openings of her bilateral lower extremities. In general she seems to be doing much worse than when I last saw her just as far as her overall health standpoint is concerned. She states that her discomfort is mainly due to neuropathy she's not having any other issues otherwise. No fevers, chills, nausea, or vomiting noted at this time. 06/30/18 on evaluation today patient actually appears to be doing a little worse in regard to her right lower extremity her left lower extremity of doing fairly well. Fortunately there is no sign of infection at this time. She has been tolerating the dressing changes without complication. Home health did not  come out like they were supposed to for the appropriate wrap changes. They stated that they never received the orders from [place] which were fax. Nonetheless we will send a copy of the orders with the patient today as well. 07/07/18 on evaluation today patient appears to be doing  much better in regard to lower extremities. She still has several openings bilaterally although since I last saw her her legs did show obvious signs of infection when she later saw her nurse. Subsequently a culture was obtained and she is been placed on Bactrim and Keflex. Fortunately things seem to be looking much better it does appear she likely had an infection. Again last week we'd even discussed it but again there really was not any obvious sign that she had infection therefore we held off on the antibiotics. Nonetheless I'm glad she's doing better today. 07/14/18 on evaluation today patient appears to be doing much better regarding her bilateral lower Trinity's. In fact on the right lower for me there's nothing open at this point there are some dry skin areas at the sites where she had infection. Fortunately there is no evidence of systemic infection which is excellent news. No fevers chills noted 07/21/18 on evaluation today patient actually appears to be doing much better in regard to her left lower extremity ulcers. She is making good progress and overall I feel like she's improving each time I see her. She's having no pain I do feel like the infection is completely resolved which is excellent news. No fevers, chills, nausea, or vomiting noted at this time. 07/28/18 on evaluation today patient appears to be doing very well in regard to her left lower [employer]. Everything seems to be showing signs of improvement which is excellent news. Overall very pleased with the progress that has been made. Fortunately there's no evidence of active infection at this time also excellent news. 08/04/18 on evaluation today patient appears to be doing more poorly in regard to her bilateral lower extremities. She has two new areas open up on the right and these were completely closed as of last week. She still has the two spots on the left which in my pinion seem to be doing better. Fortunately there's  no evidence of infection again at this point. 08/11/18 on evaluation today patient actually appears to be doing very well in regard to her bilateral lower Trinity wounds that all seem to be doing better and are measures smaller today. Fortunately there's no signs of infection. No fevers, chills, nausea, or vomiting noted at this time. 08/18/18 on evaluation today patient actually appears to be doing about the same inverter bilateral lower extremities. She continues to have areas that blistering open as was drain that fortunately nothing too significant. Overall I feel like Drawtex may have done better for her however compared to the collagen. 08/25/18 on evaluation today patient appears to be doing a little bit more poorly today even compared to last time I saw her. Again I'm not exactly sure why she's making worse progress over the past several weeks. I'm beginning to wonder if there is some kind of underlying low level infection causing this issue. I did actually take a culture from the left anterior lower extremity but it was a new wound draining quite a bit at this point. Unfortunately she also seems to be having more pain which is what also makes me worried about the possibility of infection. This is despite never erythema noted at this point. 09/01/18 on evaluation  today patient actually appears to be doing a little worse even compared to last week in regard to bilateral lower extremities. She did go to the hospital on the 16th was given a dose of IV Zosyn and then discharged with a recommendation to continue with the Bactrim that I previously prescribed for her. Nonetheless she is still having a lot of discomfort she tells me as well at this time. This is definitely unfortunate. No fevers, chills, nausea, or vomiting noted at this time. 09/08/18 on evaluation today patient's bilateral lower extremities actually appear to be shown signs of improvement which is good news. Fortunately there does not  appear to be any signs of active infection I think the anabiotic is helping in this regard. Overall I'm very pleased with how she is progressing. 09/15/18 patient was actually seen in ER yesterday due to her legs as well unfortunately. She states she's been having a lot of pain and discomfort as well as a lot of drainage. Upon inspection today the patient does have a lot of swelling and drainage I feel like this is more related to lymphedema and poor fluid control than it is to infection based on what I'm seeing. The physician in the emergency department also doubted that the patient was having a significant infection nonetheless I see no evidence of infection obvious at this point although I do see evidence of poor fluid control. She still not using a compression pumps, she is not elevating due to her lift chair as well as her hospital bed being broken, and she really is not keeping her legs up as much as they should be and also has been taking off her wraps. All this combined I think has led to poor fluid control and to be honest she may be somewhat volume overloaded in general as well. I recommend that she may need to contact your physician to see if a prescription for a diuretic would be beneficial in their opinion. As long as this is safe I think it would likely help her. 09/29/18 on evaluation today patient's left lower extremity actually appears to be doing quite a bit better. At least compared to last time that I saw her. She still has a large area where she is draining from but there's a lot of new skin speckled trout and in fact there's more new skin that there are open areas of weeping and drainage at this point. This is good news. With regard to the right lower extremity this is doing much better with the only open area that I really see being a dry spot on the right lateral ankle currently. Fortunately there's no signs of active infection at this time which is good news. No fevers, chills,  nausea, or vomiting noted at this time. The patient seems somewhat stressed and overwhelmed during the visit today she was very lethargic as such. She does and she is not taking any pain medications at this point. Apparently according to her husband are also in the process of moving which is probably taking its toll on her as well. 10/06/18 on evaluation today patient appears to be doing rather well in regard to her lower extremities compared to last evaluation. Fortunately there's no signs of active infection. She tells me she did have an appointment with her primary. Nonetheless he was concerned that the wounds were somewhat deep based on pictures but we never actually saw her legs. She states that he had her somewhat worried due to the fact that  she was fearing now that she was gonna have to have an amputation. With that being said based on what I'm seeing check she looks better this week that she has the last two times I've seen her with much less drainage I'm actually pleased in this regard. That doesn't mean that she's out of the water but again I do not think what the point of talking about education at all in regard to her leg. She is very happy to hear this. She is also not having as much pain as she was having last week. 10/13/18 unfortunately on evaluation today patient still continues to have a significant amount of drainage she's not letting home health actually apply the compression dressings at this point. She's trying to use of Juxta-Lite of the top of Kerlex and the second layer of the three layer compression wrap. With that being said she just does not seem to be making as good a progress as I would expect if she was having the compression applied and in place on a regular basis. No fevers, chills, nausea, or vomiting noted at this time. 10/20/18 on evaluation today patient appears to be doing a little better in regard to her bilateral lower extremity ulcers. In fact the right lower  extremity seems to be healed she doesn't even have any openings at this point left lower extremity though still somewhat macerated seems to be showing signs of new skin growth at multiple locations throughout. Fortunately there's no evidence of active infection at this time. No fevers, chills, nausea, or vomiting noted at this time. 10/27/18 on evaluation today patient appears to be doing much better in regard to her left lower Trinity ulcer. She's been tolerating the laptop complication and has minimal drainage noted at this point. Fortunately there's no signs of active infection at this time. No fevers, chills, nausea, or vomiting noted at this time. 11/03/18 on evaluation today patient actually appears to be doing excellent in regard to her left lower extremity. She is having very little drainage at this point there does not appear to be any significant signs of infection overall very pleased with how things have gone. She is likewise extremely pleased still and seems to be making wonderful progress week to week. I do believe antibiotics were helpful for her. Her primary care provider did place on amateur clean since I last saw her. 11/17/18 on evaluation today patient appears to be doing worse in regard to her bilateral lower extremities at this point. She is been tolerating the dressing changes without complication. With that being said she typically takes the Coban off fairly quickly upon arriving home even after being seen here in the clinic and does not allow home health reapply command as part of the dressing at home. Therefore she said no compression essentially since I last saw her as best I can tell. With that being said I think it shows and how much swelling she has in the open wounds that are noted at this point. Fortunately there's no signs of infection but unfortunately if she doesn't get this under control I think she will end up with infection and more significant issues. 11/24/18 on  evaluation today patient actually appears to be doing somewhat better in regard to her bilateral lower extremities. 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 Nonetheless that is doing better currently so she should be able to attempt reinitiating the lymphedema pumps at this time. No fevers, chills, nausea, or vomiting noted at this time. 12/01/18 upon evaluation today patient's left lower extremity appears to be doing quite well unfortunately her right lower extremity is not doing nearly as well. She has been tolerating the dressing changes without complication unfortunately she did not keep a wrap on the right at this time. Nonetheless I believe this has led to increased swelling and weeping in the world is actually much larger than during the last evaluation with her. 12/08/18 on evaluation today patient appears to be doing about the same at this point in regard to her right lower extremity. There is some more palatable to touch I'm concerned about the possibility of there being some infection although I think the main issue is she's not keeping her compression wrap on which in turn is not allowing this area to heal appropriately. 12/22/18 on evaluation today patient appears to be doing better in regard to left lower extremity unfortunately significantly worse in regard to the right lower extremity. The areas of blistering and necrotic superficial tissue have spread and again this does not really appear to be signs of infection and all she just doesn't seem to be doing nearly as well is what she has been in the past. Overall I feel like the Augmentin did absolutely nothing for her she doesn't seem to have any infection again I really didn't think so last time either is more of a potential preventative measure and hoping that this would make some difference but  I think the main issue is she's not wearing her compression. She tells me she cannot wear the [place] been we put on she takes it off pretty much upon getting home. Subsequently she worshiped Juxta-Lite when I questioned her about how often she wears it this is no more than three hours a day obviously that leaves 21 hours that she has no compression and this is obviously not doing well for her. Overall I'm concerned that if things continue to worsen she is at great risk of both infection as well as losing her leg. 01/05/19 on evaluation today patient appears to be doing well in regard to her left lower extremity which he is allowing [place] to wrap and not so well with regard to her right lower extremity which she is not allowing [place] to really wrap and keep the wrap on. She states that it hurts too badly whenever it's wrapped and she ends up having to take it off. She's been using the Juxta-Lite she tells me up to six hours a day although I question whether or not that's really been the case to be honest. Previously she told me three hours today nonetheless obviously the legs as long as the wrap is doing great when she is not is doing much more poorly. 01/12/2019 on evaluation today patient actually appears to be doing a little better in my opinion with regard to her right lower extremity ulcer. She has a small open area on the left lower extremity unfortunately but again this I think is part of the normal fluctuation of what she is going to have to expect with regard to her legs especially when she is not using her lymphedema pumps on a regular basis. Subsequently based on what I am seeing today I think that she does seem to be doing slightly better with regard to her right lower extremity  she did see her primary care provider on Monday they felt she had an infection and placed her on 2 antibiotics. Both Cipro and clindamycin. Subsequently again she seems possibly to be doing a little bit better in regards to  the right lower extremity she also tells me however she has been wearing the compression wrap over the past week since I spoke with her as well that is a Kerlix and Coban wrap on the right. No fevers, chills, nausea, vomiting, or diarrhea. 01/19/2019 on evaluation today patient appears to be doing better with regard to her bilateral lower extremities especially the right. I feel like the compression has been beneficial for her which is great news. She did get a call from her primary care provider on her way here today telling her that she did have methicillin- resistant Staphylococcus aureus and he was calling in a couple new antibiotics for her including a ointment to be applied she tells me 3 times a day. With that being said this sounds like likely to be Bactroban which I think could be applied with each dressing/wrap change but I would not be able to accommodate her applying this 3 times a day. She is in agreement with the least doing this we will add that to her orders today. 01/26/2019 on evaluation today patient actually appears to be doing much better with regard to her right lower extremity. Her left lower extremity is also doing quite well all things considering. Fortunately there is no evidence of active infection at this time. No fevers, chills, nausea, vomiting, or diarrhea. 02/02/2019 on evaluation today patient appears to be doing much better compared to her last evaluation. Little by little off like her right leg is returning more towards normal. There does not appear to be any signs of active infection and overall she seems to be doing quite well which is great news. I am very pleased in this regard. No fevers, chills, nausea, vomiting, or diarrhea. 02/09/2019 upon evaluation today patient appears to be doing better with regard to her bilateral lower extremities. She has been tolerating the dressing changes without complication. Fortunately there is no signs of active infection at this  time. No fevers, chills, nausea, vomiting, or diarrhea. 02/23/2019 on evaluation today patient actually appears to be doing quite well with regard to her bilateral lower extremities. She has been tolerating the dressing changes without complication. She is even used her pumps one time and states that she really felt like it felt good. With that being said she seems to be in good spirits and her legs appear to be doing excellent. 03/09/2019 on evaluation today patient appears to be doing well with regard to her right lower extremity there are no open wounds at this time she is having some discomfort but I feel like this is more neuropathy than anything. With regard to her left lower extremity she had several areas scattered around that she does have some weeping and drainage from but again overall she does not appear to be having any significant issues and no evidence of infection at this time which is good news. 03/23/2019 on evaluation today patient appears to be doing well with regard to her right lower extremity which she tells me is still close she is using her juxta light here. Her left lower extremity she mainly just has an area on the foot which is still slightly draining although this also is doing great. Overall very pleased at this time. 04/06/2019 patient appears to be  doing a little bit worse in regard to her left lower extremity upon evaluation today. She feels like this could be becoming infected again which she had issues with previous. Fortunately there is no signs of systemic infection but again this is always a struggle with her with her legs she will go from doing well to not so well in a very short amount of time. 04/20/2019 on evaluation today patient actually appears to be doing quite well with regard to her right lower extremity I do not see any signs of active infection at this time. Fortunately there is no fever chills noted. She is still taking the antibiotics which I prescribed  for her at this point. In regard to the left lower extremity I do feel like some of these areas are better although again she still is having weeping from several locations at this time. 04/27/2019 on evaluation today patient appears to be doing about the same if not slightly worse in regard to her left lower extremity ulcers. She tells me when questioned that she has been sleeping in her Hoveround chair in fact she tells me she falls asleep without even knowing it. I think she is spending a whole lot of time in the chair and less time walking and moving around which is not good for her legs either. On top of that she is in a seated position which is also the worst position she is not really elevating her legs and she is also not using her lymphedema pumps. All this is good to contribute to worsening of her condition in general. 05/18/2019 on evaluation today patient appears to be doing well with regard to her lower extremity on the right in fact this is showing no signs of any open wounds at this time. On the left she is continuing to have issues with areas that do drain. Some of the regions have healed and there are couple areas that have reopened. She did go to the ER per the patient according to recommendations from the home health nurse due to what she was seen when she came out on 05/13/2019. Subsequently she felt like the patient needed to go to the hospital due to the fact that again she was having "milky white discharge" from her leg. Nonetheless she had and then was placed on doxycycline and subsequently seems to be doing better. 06/01/2019 upon evaluation today patient appears to be doing really in my opinion about the same. I do not see any signs of active infection which is good news. Overall she still has wounds over the bilateral lower extremities she has reopened on the right but this appears to be more of a crack where there is weeping/edema coming from the region. I do not see any  evidence of infection at either site based on what I visualized today. 07/13/2019 upon evaluation today patient appears to be doing a little worse compared to last time I saw her. She since has been in the hospital from 06/21/2019 through 06/29/2019. This was secondary to having Covid. During that time they did apply lotion to her legs which unfortunately has caused her to develop a myriad of open wounds on her lower extremities. Her legs do appear to be doing better as far as the overall appearance is concerned but nonetheless she does have more open and weeping areas. 07/27/2019 upon evaluation today patient appears to be doing more poorly to be honest in regard to her left lower extremity in particular. There is no  signs of systemic infection although I do believe she may have local infection. She notes she has been having a lot of blue/green drainage which is consistent potentially with Pseudomonas. That may be something that we need to consider here as well. The doxycycline does not seem to have been helping. 08/03/2019 upon evaluation today patient appears to be doing a little better in my opinion compared to last week's evaluation. Her culture I did review today and she is on appropriate medications to help treat the Enterobacter that was noted. Overall I feel like that is good news. With that being said she is unfortunately continuing to have a lot of drainage and though it is doing better I still think she has a long ways to go to get things dried up in general. Fortunately there is no signs of systemic infection. 08/10/2019 upon evaluation today patient appears to be doing may be slightly better in regard to her left lower extremity the right lower extremity is doing much better. Fortunately there is no signs of infection right now which is good news. No fevers, chills, nausea, vomiting, or diarrhea. 08/24/2019 on evaluation today patient appears to be doing slightly better in regard to her lower  extremities. The left lower extremity seems to be healed the right lower extremity is doing better though not completely healed as far as the openings are concerned. She has some generalized issues here with edema and weeping secondary to her lymphedema though again I do believe this is little bit drier compared to prior weeks evaluations. In general I am very pleased with how things seem to be progressing. No fevers, chills, nausea, vomiting, or diarrhea. 08/31/2019 upon evaluation today patient actually seems to making some progress here with regard to the left lower extremity in particular. She has been tolerating the dressing changes without complication. Fortunately there is no signs of active infection at this time. No fevers, chills, nausea, vomiting, or diarrhea. She did see Dr. Doren Custard and he did note that she did have a issue with the left great saphenous vein and the small saphenous vein in the leg. With that being said he was concerned about the possibility of laser ablation not being extremely successful. He also mentioned a small risk of DVT associated with the procedure. However if the wounds do not continue to improve he stated that that would probably be the way to go. Fortunately the patient's legs do seem to be doing much better. 09/07/2019 upon evaluation today patient appears to be doing better with regard to her lower extremities. She has been tolerating the dressing changes without complication. With that being said she is showing signs of improvement and overall very pleased. There are some areas on her leg that I think we do need to debride we discussed this last week the patient is in agreement with doing that as long as it does not hurt too badly. 09/14/2019 upon evaluation today patient appears to be doing decently well with regard to her left lower extremity. She is not having near as much weeping as she has had in the past things seem to be drying up which is good news. There  is no signs of active infection at this time. 09/21/19 upon evaluation today patient appears to be doing better in regard overall to her bilateral lower extremities. She again has less open than she did previous and each week I feel like this is getting better. Fortunately there is no signs of active infection at this time.  No fevers, chills, nausea, vomiting, or diarrhea. 09/28/2019 upon evaluation today patient actually appears to be showing signs of improvement with regard to her left lower extremity. Unfortunately the right medial lower extremity around the ankle region has reopened to some degree but this appears to be minimal still which is good news. There is no signs of active infection at this time which is also good news. 10/12/2019 upon evaluation today patient appears to be doing okay with regard to her bilateral lower extremities today. The right is a little bit worse then last evaluation 2 weeks ago. The left is actually doing a little better in my opinion. Overall there is no signs of active infection at this time that I see. Obviously that something we have to keep a close eye on she is very prone to this with the significant and multiple openings that she has over the bilateral lower extremities. 10/19/2019 upon evaluation today patient appears to be doing about the best that I have seen her in quite some time. She has been tolerating the dressing changes without complication. There does not appear to be any signs of active infection and overall I am extremely happy with the way her legs appeared. She is drying up quite nicely and overall is having less pain. 11/09/2019 upon evaluation today patient appears to be doing better in regard to her wounds. She seems to be drying up more and more each time I see her this is just taking a very long time. Fortunately there is no signs of active infection at this time. 11/23/2019 upon evaluation today patient actually appears to be doing excellent in  regard to her lower extremities at this point compared to where she has been. Fortunately there is no signs of active infection at this time. She did go to the hospital last week for nausea and vomiting completely unrelated to her wounds. Fortunately she is doing better she was given some Reglan and got better. She had associated abdominal pain but they never found out what was going on. 12/07/2019 upon evaluation today patient appears to be doing well for the most part in regard to her legs. She unfortunately has not been keeping the Coban portion of her wraps on therefore the compression has not really been sufficient for what it is supposed to be. Nonetheless she tells me that it just hurt too bad therefore she removed it. 12/21/2019 upon evaluation today patient actually appears to be doing quite well with regard to her legs. I do feel like she has been making progress which is great news and overall there is no signs of active infection at this time. No fevers, chills, nausea, vomiting, or diarrhea. 01/04/2020 upon evaluation today patient presents for follow-up concerning her lower extremity edema bilaterally. She still has open wounds she has not been using her lymphedema pumps. She is also not been utilizing her compression wraps appropriately she tends to unwrap them, take them off, or states that they hurt. Obviously the reason they hurt is because her legs start to swell but the issue is if she would use her compression/lymphedema pumps regularly she would not swell and she would have the pain. Nonetheless she has not even picked them up once honestly over the past several months and may be even as much as in the past year based on my opinion and what have seen. She tells me today that after last week when I talked about this with her specifically actually that was 2 weeks ago  that she "forgot". 8//21 on evaluation today patient appears to be doing a little better in regard to her legs  bilaterally. Fortunately there is no signs of active infection at this time. She tells me that she used her lymphedema pumps all of one time over the past 2 weeks since I last saw her. She tells me that she has been too busy in order to continue to use these. 02/01/2020 on evaluation today patient appears to be doing some better in regard to her wounds in general in her legs. We felt the right was healed although is not completely it does appear to be doing better she tells me she has been using her lymphedema pumps that she has had this six times since I last saw her. Obviously the more she does that the better she would do my opinion 02/15/2020 upon evaluation today patient appears to be doing about the same in regard to her legs. She tells me that she is pumping I'm still not sure how much she does to be perfectly honest. However even if she does a little bit here and there I guess that is better than nothing. Fortunately there is no sign of active infection at this time which is great news. No fevers, chills, nausea, vomiting, or diarrhea. 02/29/2020 on evaluation today patient actually appears to be doing quite well all things considered this week. She has been tolerating the dressing changes without complication. Fortunately there is no signs of active infection at this time. No fevers, chills, nausea, vomiting, or diarrhea. 03/14/2020 upon evaluation today patient appears to be doing really about the same in regard to her legs. There is no signs of improvement overall and she as noted from home health does not appear to be elevating her legs he can get into her lift chair. There is too much stuff piled up on it the patient tells me. She also tells me she cannot really use her pumps effectively due to the fact that she cannot have any space to get them on. Finally she is also not really elevating her legs because she is not sleeping in her bed she is sleeping in her chair currently and again overall I  think everything that she is done in combination has been exactly the wrong thing for what she needs for her legs. 04/04/2020 upon evaluation today patient appears to be doing well at this time with regard to her legs. She is actually been pumping, keeping her wraps on, and to be honest she seems to be doing dramatically better the right leg is excellent the left leg is also excellent and measuring much smaller than previous. 04/18/2020 upon evaluation today patient actually is continue to make good progress in regard to her lower extremities bilaterally. Everything is improving and less wet that has been in the past overall I am extremely pleased with where things stand and I think that she is making great progress. The patient tells me she still continue to use her compression pumps 05/02/2020 on evaluation today patient appears to be doing well at this time in regard to her left leg which is showing signs of drying up. With that being said she does have a lot of lymphedema type crusty skin around the toes of her left foot and the right medial ankle which has opened at this point. Fortunately there is no signs of active infection systemically at this point or even locally for that matter. 05/23/2020 on evaluation today patient appears to be  doing well with regard to her lower extremities. Fortunately there is no signs of active infection at this time. No fever chills noted. She has been very depressed however she tells me. 06/06/2020 patient came in today for evaluation in regard to her bilateral lower extremity ulcerations. With that being said she came in feeling okay and actually laughing and joking around with the staff checking her in. Subsequently however she had a coughing spell and following the coughing spell it was a dramatic conversion from being jovial and joking around to being extremely short of breath her vital signs actually dropped in regard to her blood pressure from around 175 to  down around 354 for systolic and from around 96 diastolic down to around 70. With that being said she also accompanied this with an increase in her respiratory rate which was also quite significant. Nonetheless I actually upon going into see her was extremely worried we called EMS to have her transported to the ER for further evaluation and treatment. She continued until EMS got here to be extremely short of breath even on 6 L of oxygen. Her oxygen saturation did come up to 99% but overall it was only 95 before which was not terrible she did feel like the oxygen helped her feel somewhat better however. She has a history of asthma but again even the coughing spell really should not have done this degree of alteration in her demeanor from where she was just before to after the coughing spell. She tells [place] however she has been feeling somewhat abnormal since Friday although she could not really pinpoint exactly what was going on. Objective Constitutional Well-nourished and well-hydrated in no acute distress. Vitals Time Taken: 1:45 PM, Height: 62 in, Weight: 335 lbs, BMI: 61.3, Temperature: 98.5 F, Pulse: 79 bpm, Respiratory Rate: 18 breaths/min, Blood Pressure: 178/96 mmHg. General Notes: Pt vitals were being taken when she c/o slight chest pain and having trouble breathing. Upon assessment pt breathing began to decline. Pt placed on 6L of O2 and provider was notified. Pt AOx4 however lung sounds show diminished perfusion. EMS called Respiratory normal breathing without difficulty. Psychiatric this patient is able to make decisions and demonstrates good insight into disease process. Alert and Oriented x 3. pleasant and cooperative. General Notes: On inspection patient appears to be very much lethargic at this point and I am concerned about the fact that to be honest I think she is having a very difficult time breathing despite her oxygen saturation being okay her blood pressure is somewhat  abnormal today. She had a fairly significant change from feeling pretty good to significantly worsen this accompanied by an increase in her respiratory rate from around 18 to around 24 and that was at best. She appeared to be somewhat significantly ill and I did feel like she needed to get to the ER for further evaluation and treatment EMS was notified. 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 Would recommend currently that we  going to continue with the recommendation from the patient go to the ER for further evaluation and treatment we just put a loose ABD pad with Kerlix wrap on her today to get her there and then we will have them assess things as going forward. She does not appear to have any signs of infection right now which is good news. 2. Her breathing is not doing very well which is my main concern at this time she is having respiration rates around 24 that even on oxygen but she feels a little better things still do not appear to be doing normal. We will see her back following the ER visit I did transport her via EMS to the hospital today for further evaluation and treatment Electronic Signature(s) Signed: 06/06/2020 2:24:39 PM By: [name] PA-C Entered By: [name] on 06/06/2020  14:24:38 -------------------------------------------------------------------------------- SuperBill Details Patient Name: Date of Service: [name] Duke. 06/06/2020 Medical Record Number: [id number] Patient Account Number: [id number] Date of Birth/Sex: Treating RN: 03-04-1951 (69 y.o. [name] Primary Care Provider: [name] Other Clinician: Referring Provider: Treating Provider/Extender: Doyle Askew, FRED Weeks in Treatment: 148 Diagnosis Coding ICD-10 Codes Code Description E11.622 Type 2 diabetes mellitus with other skin ulcer I89.0 Lymphedema, not elsewhere classified I87.331 Chronic venous hypertension (idiopathic) with ulcer and inflammation of right lower extremity I87.332 Chronic venous hypertension (idiopathic) with ulcer and inflammation of left lower extremity L97.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 leg with fat layer exposed L97.822 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 I10 Essential (primary) hypertension E66.01 Morbid (severe) obesity due to excess calories F41.8 Other specified anxiety disorders R53.1 Weakness Facility Procedures The patient participates with Medicare or their insurance follows the Medicare Facility Guidelines: CPT4 Code Description Modifier Quantity [id number] - WOUND CARE VISIT-LEV 2 EST PT 1 Physician Procedures : CPT4 Code Description Modifier [id number] - WC PHYS LEVEL 4 - EST PT ICD-10 Diagnosis Description E11.622 Type 2 diabetes mellitus with other skin ulcer I89.0 Lymphedema, not elsewhere classified I87.331 Chronic venous hypertension (idiopathic) with  ulcer and inflammation of right lower extremity I87.332 Chronic venous hypertension (idiopathic) with ulcer and inflammation of left lower extremity Quantity: 1 Electronic Signature(s) Signed: 06/06/2020  7:00:57 PM By: Levan Hurst RN, BSN Signed: 06/07/2020 4:47:28 PM By: [name] PA-C Previous Signature: 06/06/2020 2:25:18 PM Version By: [name] PA-C Entered By: Levan Hurst on 06/06/2020 17:46:46

## 2020-06-27 ENCOUNTER — Other Ambulatory Visit: Payer: Self-pay

## 2020-06-27 ENCOUNTER — Encounter (HOSPITAL_BASED_OUTPATIENT_CLINIC_OR_DEPARTMENT_OTHER): Payer: Medicare PPO | Attending: Physician Assistant | Admitting: Physician Assistant

## 2020-06-27 DIAGNOSIS — Z8673 Personal history of transient ischemic attack (TIA), and cerebral infarction without residual deficits: Secondary | ICD-10-CM | POA: Diagnosis not present

## 2020-06-27 DIAGNOSIS — R531 Weakness: Secondary | ICD-10-CM | POA: Diagnosis not present

## 2020-06-27 DIAGNOSIS — E11622 Type 2 diabetes mellitus with other skin ulcer: Secondary | ICD-10-CM | POA: Diagnosis present

## 2020-06-27 DIAGNOSIS — I87331 Chronic venous hypertension (idiopathic) with ulcer and inflammation of right lower extremity: Secondary | ICD-10-CM | POA: Insufficient documentation

## 2020-06-27 DIAGNOSIS — Z6841 Body Mass Index (BMI) 40.0 and over, adult: Secondary | ICD-10-CM | POA: Insufficient documentation

## 2020-06-27 DIAGNOSIS — I890 Lymphedema, not elsewhere classified: Secondary | ICD-10-CM | POA: Insufficient documentation

## 2020-06-27 DIAGNOSIS — L97822 Non-pressure chronic ulcer of other part of left lower leg with fat layer exposed: Secondary | ICD-10-CM | POA: Diagnosis not present

## 2020-06-27 DIAGNOSIS — L97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 leg with fat layer exposed: Secondary | ICD-10-CM | POA: Insufficient documentation

## 2020-06-27 DIAGNOSIS — L97522 Non-pressure chronic ulcer of other part of left foot with fat layer exposed: Secondary | ICD-10-CM | POA: Diagnosis not present

## 2020-06-27 DIAGNOSIS — I10 Essential (primary) hypertension: Secondary | ICD-10-CM | POA: Diagnosis not present

## 2020-06-27 DIAGNOSIS — I87332 Chronic venous hypertension (idiopathic) with ulcer and inflammation of left lower extremity: Secondary | ICD-10-CM | POA: Diagnosis not present

## 2020-06-27 DIAGNOSIS — F418 Other specified anxiety disorders: Secondary | ICD-10-CM | POA: Diagnosis not present

## 2020-06-27 LAB — GLUCOSE, CAPILLARY: Glucose-Capillary: 238 mg/dL — ABNORMAL HIGH (ref 70–99)

## 2020-06-27 NOTE — Progress Notes (Signed)
[name] ([id number]) Visit Report for 06/27/2020 Arrival Information Details Patient Name: Date of Service: [name] 06/27/2020 12:30 PM Medical Record Number: [id number] Patient Account Number: [id number] Date of Birth/Sex: Treating RN: 18-Sep-1950 (70 y.o. [name] Primary Care Lakin Rhine: [name] Other Clinician: Referring [name]: Treating [name]/Extender: Darlen Round in Treatment: 93 Visit Information History Since Last Visit Added or deleted any medications: Yes Patient Arrived: Wheel Chair Any new allergies or adverse reactions: No Arrival Time: 12:45 Had a fall or experienced change in No Accompanied By: self activities of daily living that may affect Transfer Assistance: None risk of falls: Patient Identification Verified: Yes Signs or symptoms of abuse/neglect since last visito No Secondary Verification Process Completed: Yes Hospitalized since last visit: No Patient Requires Transmission-Based Precautions: No Implantable device outside of the clinic excluding No Patient Has Alerts: Yes cellular tissue based products placed in the center Patient Alerts: R ABI= 1.01 since last visit: L ABI = .99 Has Dressing in Place as Prescribed: Yes Has Compression in Place as Prescribed: Yes Pain Present Now: Yes Notes per patient PCP added "memory medication" unsure of name and glipizide medication as well. Electronic Signature(s) Signed: 06/27/2020 4:35:23 PM By: [name] Entered By: [name] on 06/27/2020 12:49:54 -------------------------------------------------------------------------------- Compression Therapy Details Patient Name: Date of Service: [name] 06/27/2020 12:30 PM Medical Record Number: [id number] Patient Account Number: [id number] Date of Birth/Sex: Treating RN: Jul 22, 1950 (70 y.o. [name] Primary Care Dorell Gatlin: [name] Other Clinician: Referring [name]: Treating  [name]/Extender: Darlen Round in Treatment: 151 Compression Therapy Performed for Wound Assessment: Wound #61 Left,Circumferential Lower Leg Performed By: Clinician [name] Hammock, RN Compression Type: Three Layer Post Procedure Diagnosis Same as Pre-procedure Electronic Signature(s) Signed: 06/27/2020 5:15:42 PM By: [name] Gouty RN, BSN Entered By: [name] on 06/27/2020 13:20:00 -------------------------------------------------------------------------------- Compression Therapy Details Patient Name: Date of Service: [name]. 06/27/2020 12:30 PM Medical Record Number: [id number] Patient Account Number: [id number] Date of Birth/Sex: Treating RN: [birth year] (70 y.o. [name] Primary Care Brayla Pat: [name] Other Clinician: Referring Shalisha Clausing: Treating Amisha Pospisil/Extender: Darlen Round in Treatment: 151 Compression Therapy Performed for Wound Assessment: Wound #67 Right,Medial Lower Leg Performed By: Clinician [name] Hammock, RN Compression Type: Three Layer Post Procedure Diagnosis Same as Pre-procedure Electronic Signature(s) Signed: 06/27/2020 5:15:42 PM By: [name] Gouty RN, BSN Entered By: [name] on 06/27/2020 13:20:00 -------------------------------------------------------------------------------- Encounter Discharge Information Details Patient Name: Date of Service: [name]. 06/27/2020 12:30 PM Medical Record Number: [id number] Patient Account Number: [id number] Date of Birth/Sex: Treating RN: [birth year] (70 y.o. [name] Primary Care Broc Caspers: [name] Other Clinician: Referring Dock Baccam: Treating Wayland Baik/Extender: Darlen Round in Treatment: [phone] Encounter Discharge Information Items Discharge Condition: Stable Ambulatory Status: Wheelchair Discharge Destination: Home Transportation: Private Auto Accompanied By: self Schedule  Follow-up Appointment: Yes Clinical Summary of Care: Patient Declined Notes Patient was weak standing up attempting to transfer from chair to wheelchair two assist. Patient c/o [facility] with exertion. Upon observation patient seems lethargic as well. Checked her 02 sats 94% with 22 RR. Per patient checked sugar 88 today last ate at 0600 cereal. Explained to patient she is diabetic and has to eat throughout the day and it is 1400. Checked blood glucose 238. PA aware and assessed patient explained the need to see PCP as soon as possible. Explained to patient to call as soon as she leaves the office. Per patient  sees the Duron Meister on Monday. Explained it is important to see sooner as something is going on. Patient in agreement to call and see if she could be seen sooner. Electronic Signature(s) Signed: 06/27/2020 4:35:23 PM By: [name] Entered By: [name] on 06/27/2020 14:57:34 -------------------------------------------------------------------------------- Lower Extremity Assessment Details Patient Name: Date of Service: [name], [name] 06/27/2020 12:30 PM Medical Record Number: [id number] Patient Account Number: [id number] Date of Birth/Sex: Treating RN: 28-Sep-1950 (70 y.o. [name] Primary Care Thia Olesen: Other Clinician: Dustin [name] Referring Kaston Faughn: Treating Banesa Tristan/Extender: Darlen Round in Treatment: 151 Edema Assessment Assessed: [name]: Yes] [name]: Yes] Edema: [Left: Yes] [Right: Yes] Calf Left: Right: Point of Measurement: 37 cm From Medial Instep 38 cm 38 cm Ankle Left: Right: Point of Measurement: 10 cm From Medial Instep 29 cm 24 cm Vascular Assessment Pulses: Dorsalis Pedis Palpable: [Left:Yes] [Right:Yes] Posterior Tibial Palpable: [Left:Yes] [Right:Yes] Electronic Signature(s) Signed: 06/27/2020 4:35:23 PM By: [name] Entered By: [name] on 06/27/2020  12:55:37 -------------------------------------------------------------------------------- [place] Details Patient Name: Date of Service: [name]. 06/27/2020 12:30 PM Medical Record Number: [id number] Patient Account Number: [id number] Date of Birth/Sex: Treating RN: Jul 24, 1950 (70 y.o. [name] Primary Care Gaylia Kassel: [name] Other Clinician: Referring Sheyann Sulton: Treating Sarahlynn Cisnero/Extender: Darlen Round in Treatment: [phone] Active Inactive Venous Leg Ulcer Nursing Diagnoses: Actual venous Insuffiency (use after diagnosis is confirmed) Knowledge deficit related to disease process and management Goals: Patient will maintain optimal edema control Date Initiated: 08/12/2017 Target Resolution Date: 07/25/2020 Goal Status: Active Patient/caregiver will verbalize understanding of disease process and disease management Date Initiated: 08/12/2017 Date Inactivated: 04/21/2018 Target Resolution Date: 04/24/2018 Goal Status: Met Interventions: Assess peripheral edema status every visit. Compression as ordered Treatment Activities: Therapeutic compression applied : 08/12/2017 Notes: Wound/Skin Impairment Nursing Diagnoses: Impaired tissue integrity Knowledge deficit related to ulceration/compromised skin integrity Goals: Patient/caregiver will verbalize understanding of skin care regimen Date Initiated: 08/12/2017 Target Resolution Date: 07/25/2020 Goal Status: Active Ulcer/skin breakdown will have a volume reduction of 30% by week 4 Date Initiated: 08/05/2017 Date Inactivated: 09/30/2017 Target Resolution Date: 10/03/2017 Goal Status: Met Ulcer/skin breakdown will have a volume reduction of 50% by week 8 Date Initiated: 09/30/2017 Date Inactivated: 10/28/2017 Target Resolution Date: 10/28/2017 Goal Status: Met Interventions: Assess patient/caregiver ability to perform ulcer/skin care regimen upon admission and as needed Assess  ulceration(s) every visit Provide education on ulcer and skin care Screen for HBO Treatment Activities: Patient referred to home care : 08/05/2017 Skin care regimen initiated : 08/05/2017 Topical wound management initiated : 08/05/2017 Notes: Electronic Signature(s) Signed: 06/27/2020 5:15:42 PM By: [name] Gouty RN, BSN Entered By: [name] on 06/27/2020 13:18:46 -------------------------------------------------------------------------------- Pain Assessment Details Patient Name: Date of Service: [name]. 06/27/2020 12:30 PM Medical Record Number: [id number] Patient Account Number: [id number] Date of Birth/Sex: Treating RN: [birth year] (70 y.o. [name] Primary Care Sruthi Maurer: [name] Other Clinician: Referring Jakson Delpilar: Treating Kijuana Ruppel/Extender: Darlen Round in Treatment: 151 Active Problems Location of Pain Severity and Description of Pain Patient Has Paino Yes Site Locations Pain Location: Pain in Ulcers With Dressing Change: Yes Duration of the Pain. Constant / Intermittento Intermittent Rate the pain. Current Pain Level: 6 Worst Pain Level: 10 Least Pain Level: 0 Tolerable Pain Level: 7 Character of Pain Describe the Pain: Aching Pain Management and Medication Current Pain Management: Medication: Yes Cold Application: No Rest: Yes Massage: No Activity: No T.E.N.S.: No Heat Application: No Leg drop or  elevation: No Is the Current Pain Management Adequate: Inadequate How does your wound impact your activities of daily livingo Sleep: No Bathing: No Appetite: No Relationship With Others: No Bladder Continence: No Emotions: No Bowel Continence: No Work: No Toileting: No Drive: No Dressing: No Hobbies: No Electronic Signature(s) Signed: 06/27/2020 4:35:23 PM By: [name] Entered By: [name] on 06/27/2020  12:55:06 -------------------------------------------------------------------------------- Patient/Caregiver Education Details Patient Name: Date of Service: [name] 1/12/2022andnbsp12:30 PM Medical Record Number: [id number] Patient Account Number: [id number] Date of Birth/Gender: Treating RN: 1950-09-27 (70 y.o. [name] Primary Care Physician: [name] Other Clinician: Referring Physician: Treating Physician/Extender: Darlen Round in Treatment: 23 Education Assessment Education Provided To: Patient Education Topics Provided Venous: Methods: Explain/Verbal Responses: Reinforcements needed, State content correctly Wound/Skin Impairment: Methods: Explain/Verbal Responses: Reinforcements needed, State content correctly Electronic Signature(s) Signed: 06/27/2020 5:15:42 PM By: [name] Gouty RN, BSN Entered By: [name] on 06/27/2020 13:19:11 -------------------------------------------------------------------------------- Wound Assessment Details Patient Name: Date of Service: [name]. 06/27/2020 12:30 PM Medical Record Number: [id number] Patient Account Number: [id number] Date of Birth/Sex: Treating RN: 05-01-1951 (70 y.o. [name] Primary Care Earnest Thalman: [name] Other Clinician: Referring Atwell Mcdanel: Treating Andreina Outten/Extender: Darlen Round in Treatment: 151 Wound Status Wound Number: 61 Primary Venous Leg Ulcer Etiology: Wound Location: Left, Circumferential Lower Leg Wound Open Wounding Event: Gradually Appeared Status: Date Acquired: 04/20/2019 Comorbid Asthma, Hypertension, Peripheral Arterial Disease, Peripheral Weeks Of Treatment: 62 History: Venous Disease, Type II Diabetes, Gout, Osteoarthritis Clustered Wound: Yes Wound Measurements Length: (cm) 8.5 Width: (cm) 16 Depth: (cm) 0.1 Clustered Quantity: 2 Area: (cm) 106.814 Volume: (cm) 10.681 % Reduction in Area:  -1316.6% % Reduction in Volume: -1316.6% Epithelialization: Large (67-100%) Tunneling: No Undermining: No Wound Description Classification: Full Thickness Without Exposed Support Structures Wound Margin: Flat and Intact Exudate Amount: Medium Exudate Type: Serous Exudate Color: amber Foul Odor After Cleansing: No Slough/Fibrino Yes Wound Bed Granulation Amount: Large (67-100%) Exposed Structure Granulation Quality: Pink, Pale Fascia Exposed: No Necrotic Amount: None Present (0%) Fat Layer (Subcutaneous Tissue) Exposed: Yes Tendon Exposed: No Muscle Exposed: No Joint Exposed: No Bone Exposed: No Treatment Notes Wound #61 (Lower Leg) Wound Laterality: Left, Circumferential Cleanser Peri-Wound Care Zinc Oxide Ointment 30g tube Discharge Instruction: Apply Zinc Oxiide to weeping areas with each dressing change Sween Lotion (Moisturizing lotion) Discharge Instruction: Apply moisturizing lotion as directed Topical Primary Dressing KerraCel Ag Gelling Fiber Dressing, 4x5 in (silver alginate) Discharge Instruction: Apply silver alginate to wound bed as instructed Secondary Dressing Woven Gauze Sponge, Non-Sterile 4x4 in Discharge Instruction: Apply over primary dressing as directed. ABD Pad, 5x9 Discharge Instruction: Apply over primary dressing as directed. Secured With Compression Wrap ThreePress (3 layer compression wrap) Discharge Instruction: Apply three layer compression as directed. Compression Stockings Add-Ons Electronic Signature(s) Signed: 06/27/2020 4:35:23 PM By: [name] Entered By: [name] on 06/27/2020 12:56:23 -------------------------------------------------------------------------------- Wound Assessment Details Patient Name: Date of Service: FLO, BERROA 06/27/2020 12:30 PM Medical Record Number: [id number] Patient Account Number: [id number] Date of Birth/Sex: Treating RN: 1950-08-25 (70 y.o. [name], [name] Primary Care Sirinity Outland:  [name] Other Clinician: Referring Aubrianne Molyneux: Treating Ilo Beamon/Extender: Darlen Round in Treatment: 151 Wound Status Wound Number: 64 Primary Diabetic Wound/Ulcer of the Lower Extremity Etiology: Wound Location: Left, Dorsal Foot Wound Open Wounding Event: Gradually Appeared Status: Date Acquired: 06/01/2019 Comorbid Asthma, Hypertension, Peripheral Arterial Disease, Peripheral Weeks Of Treatment: 56 History: Venous Disease, Type II Diabetes, Gout, Osteoarthritis Clustered  Wound: No Wound Measurements Length: (cm) 5 Width: (cm) 6 Depth: (cm) 0.1 Area: (cm) 23.562 Volume: (cm) 2.356 % Reduction in Area: -14180% % Reduction in Volume: -4708.2% Epithelialization: Large (67-100%) Tunneling: No Undermining: No Wound Description Classification: Grade 1 Wound Margin: Flat and Intact Exudate Amount: Medium Exudate Type: Serosanguineous Exudate Color: red, brown Foul Odor After Cleansing: No Slough/Fibrino Yes Wound Bed Granulation Amount: Large (67-100%) Exposed Structure Granulation Quality: Pink, Pale Fascia Exposed: No Necrotic Amount: None Present (0%) Fat Layer (Subcutaneous Tissue) Exposed: No Tendon Exposed: No Muscle Exposed: No Joint Exposed: No Bone Exposed: No Limited to Skin Breakdown Treatment Notes Wound #64 (Foot) Wound Laterality: Dorsal, Left Cleanser Peri-Wound Care Zinc Oxide Ointment 30g tube Discharge Instruction: Apply Zinc Oxiide to weeping areas with each dressing change Sween Lotion (Moisturizing lotion) Discharge Instruction: Apply moisturizing lotion as directed Topical Primary Dressing KerraCel Ag Gelling Fiber Dressing, 4x5 in (silver alginate) Discharge Instruction: Apply silver alginate to wound bed as instructed Secondary Dressing Woven Gauze Sponge, Non-Sterile 4x4 in Discharge Instruction: Apply over primary dressing as directed. ABD Pad, 5x9 Discharge Instruction: Apply over primary dressing as  directed. Secured With Compression Wrap ThreePress (3 layer compression wrap) Discharge Instruction: Apply three layer compression as directed. Compression Stockings Add-Ons Electronic Signature(s) Signed: 06/27/2020 4:35:23 PM By: [name] Entered By: [name] on 06/27/2020 12:57:10 -------------------------------------------------------------------------------- Wound Assessment Details Patient Name: Date of Service: ZALI, KAMAKA 06/27/2020 12:30 PM Medical Record Number: [id number] Patient Account Number: [id number] Date of Birth/Sex: Treating RN: [birth year] (70 y.o. [name] Primary Care Brookelle Pellicane: [name] Other Clinician: Referring Serrena Linderman: Treating Khloey Chern/Extender: Darlen Round in Treatment: 151 Wound Status Wound Number: 67 Primary Diabetic Wound/Ulcer of the Lower Extremity Etiology: Wound Location: Right, Medial Lower Leg Wound Open Wounding Event: Gradually Appeared Status: Date Acquired: 05/02/2020 Comorbid Asthma, Hypertension, Peripheral Arterial Disease, Peripheral Weeks Of Treatment: 8 History: Venous Disease, Type II Diabetes, Gout, Osteoarthritis Clustered Wound: No Wound Measurements Length: (cm) 5.5 Width: (cm) 5.5 Depth: (cm) 0.1 Area: (cm) 23.758 Volume: (cm) 2.376 % Reduction in Area: -1580.2% % Reduction in Volume: -1585.1% Epithelialization: Medium (34-66%) Tunneling: No Undermining: No Wound Description Classification: Grade 1 Wound Margin: Thickened Exudate Amount: Medium Exudate Type: Serous Exudate Color: amber Foul Odor After Cleansing: No Slough/Fibrino No Wound Bed Granulation Amount: Large (67-100%) Exposed Structure Granulation Quality: Red Fascia Exposed: No Necrotic Amount: None Present (0%) Fat Layer (Subcutaneous Tissue) Exposed: Yes Tendon Exposed: No Muscle Exposed: No Joint Exposed: No Bone Exposed: No Treatment Notes Wound #67 (Lower Leg) Wound Laterality: Right,  Medial Cleanser Peri-Wound Care Zinc Oxide Ointment 30g tube Discharge Instruction: Apply Zinc Oxiide to weeping areas with each dressing change Sween Lotion (Moisturizing lotion) Discharge Instruction: Apply moisturizing lotion as directed Topical Primary Dressing KerraCel Ag Gelling Fiber Dressing, 4x5 in (silver alginate) Discharge Instruction: Apply silver alginate to wound bed as instructed Secondary Dressing Woven Gauze Sponge, Non-Sterile 4x4 in Discharge Instruction: Apply over primary dressing as directed. ABD Pad, 5x9 Discharge Instruction: Apply over primary dressing as directed. Secured With Compression Wrap ThreePress (3 layer compression wrap) Discharge Instruction: Apply three layer compression as directed. Compression Stockings Add-Ons Electronic Signature(s) Signed: 06/27/2020 4:35:23 PM By: [name] Entered By: [name] on 06/27/2020 12:57:44 -------------------------------------------------------------------------------- Wound Assessment Details Patient Name: Date of Service: RAVINDER, HOFLAND 06/27/2020 12:30 PM Medical Record Number: [id number] Patient Account Number: [id number] Date of Birth/Sex: Treating RN: 26-Feb-1951 (70 y.o. [name] Primary Care Tywana Robotham: [name] Other Clinician: Referring  Amiri Riechers: Treating Telina Kleckley/Extender: Darlen Round in Treatment: 151 Wound Status Wound Number: 68 Primary Diabetic Wound/Ulcer of the Lower Extremity Etiology: Wound Location: Left, Anterior Lower Leg Wound Open Wounding Event: Gradually Appeared Status: Date Acquired: 05/18/2020 Comorbid Asthma, Hypertension, Peripheral Arterial Disease, Peripheral Weeks Of Treatment: 5 History: Venous Disease, Type II Diabetes, Gout, Osteoarthritis Clustered Wound: No Wound Measurements Length: (cm) 2 Width: (cm) 3 Depth: (cm) 0.1 Area: (cm) 4.712 Volume: (cm) 0.471 % Reduction in Area: 25% % Reduction in Volume:  62.5% Epithelialization: None Tunneling: No Undermining: No Wound Description Classification: Grade 2 Wound Margin: Distinct, outline attached Exudate Amount: Medium Exudate Type: Serosanguineous Exudate Color: red, brown Foul Odor After Cleansing: No Slough/Fibrino Yes Wound Bed Granulation Amount: Medium (34-66%) Exposed Structure Granulation Quality: Red, Pink Fascia Exposed: No Necrotic Amount: Medium (34-66%) Fat Layer (Subcutaneous Tissue) Exposed: No Necrotic Quality: Adherent Slough Tendon Exposed: No Muscle Exposed: No Joint Exposed: No Bone Exposed: No Treatment Notes Wound #68 (Lower Leg) Wound Laterality: Left, Anterior Cleanser Peri-Wound Care Zinc Oxide Ointment 30g tube Discharge Instruction: Apply Zinc Oxiide to weeping areas with each dressing change Sween Lotion (Moisturizing lotion) Discharge Instruction: Apply moisturizing lotion as directed Topical Primary Dressing KerraCel Ag Gelling Fiber Dressing, 4x5 in (silver alginate) Discharge Instruction: Apply silver alginate to wound bed as instructed Secondary Dressing Woven Gauze Sponge, Non-Sterile 4x4 in Discharge Instruction: Apply over primary dressing as directed. ABD Pad, 5x9 Discharge Instruction: Apply over primary dressing as directed. Secured With Compression Wrap ThreePress (3 layer compression wrap) Discharge Instruction: Apply three layer compression as directed. Compression Stockings Add-Ons Electronic Signature(s) Signed: 06/27/2020 4:35:23 PM By: [name] Entered By: [name] on 06/27/2020 12:58:21 -------------------------------------------------------------------------------- Vitals Details Patient Name: Date of Service: [name]. 06/27/2020 12:30 PM Medical Record Number: [id number] Patient Account Number: [id number] Date of Birth/Sex: Treating RN: 21-Mar-1951 (70 y.o. [name], [name] Primary Care Treyden Hakim: [name] Other Clinician: Referring  Katiejo Gilroy: Treating Dayvon Dax/Extender: Darlen Round in Treatment: 151 Vital Signs Time Taken: 12:53 Temperature (F): 98.1 Height (in): 62 Pulse (bpm): 80 Weight (lbs): 335 Respiratory Rate (breaths/min): 17 Body Mass Index (BMI): 61.3 Blood Pressure (mmHg): 149/58 Capillary Blood Glucose (mg/dl): 222 Reference Range: 80 - 120 mg / dl Electronic Signature(s) Signed: 06/27/2020 4:35:23 PM By: [name] Entered By: [name] on 06/27/2020 12:54:28

## 2020-06-27 NOTE — Progress Notes (Addendum)
[name], POLHEMUS ([id number]) Visit Report for 06/27/2020 Chief Complaint Document Details Patient Name: Date of Service: [name], [name] 06/27/2020 12:30 PM Medical Record Number: [id number] Patient Account Number: [id number] Date of Birth/Sex: Treating RN: 1951-05-28 (70 y.o. [name] Primary Care Provider: [name] Other Clinician: Referring Provider: Treating Provider/Extender: Darlen Round in Treatment: 151 Information Obtained from: Patient Chief Complaint Bilateral reoccurring LE ulcers Electronic Signature(s) Signed: 06/27/2020 12:54:36 PM By: [name] PA-C Entered By: [name] on 06/27/2020 12:54:36 -------------------------------------------------------------------------------- HPI Details Patient Name: Date of Service: [name]. 06/27/2020 12:30 PM Medical Record Number: [id number] Patient Account Number: [id number] Date of Birth/Sex: Treating RN: [birth year] (70 y.o. [name] Primary Care Provider: [name] Other Clinician: Referring Provider: Treating Provider/Extender: Darlen Round in Treatment: 151 History of Present Illness HPI Description: this patient has been seen a couple of times before and returns with recurrent problems to her right and left lower extremity with swelling and weeping ulcerations due to not wearing her compression stockings which she had been advised to do during her last discharge, at the end of June 2018. During her last visit the patient had had normal arterial blood flow and her venous reflux study did not necessitate any surgical intervention. She was recommended compression and elevation and wound care. After prolonged treatment the patient was completely healed but she has been noncompliant with wearing or compressions.. She was here last week with an outpatient return visit planned but the patient came in a very poor general condition with altered  mental status and was rushed to the ER on my request. With a history of hypertension, diabetes, TIA and right-sided weakness she was set up for an MRI on her brain and cervical spine and was sent to [facility]. Getting an MRI done was very difficult but once the workup was done she was found not to have any spinal stenosis, epidural abscess or hematoma or discitis. This was radiculopathy to be treated as an outpatient and she was given a follow-up appointment. Today she is feeling much better alert and oriented and has come to reevaluate her bilateral lower extremity lymphedema and ulceration 03/25/2017 -- she was admitted to the hospital on 03/16/2017 and discharged on 03/18/2017 with left leg cellulitis and ulceration. She was started on vancomycin and Zosyn and x-ray showed no bony involvement. She was treated for a cellulitis with IV antibiotics changed to Rocephin and Flagyl and was discharged on oral Keflex and doxycycline to complete a 7 day course. Last hemoglobin A1c was 7.1 and her other ailments including hypertension got asthma were appropriately treated. 05/06/2017 -- she is awaiting the right size of compression stockings from [place] but other than that has been doing well. ====== Old notes 70 year old patient was seen one time last October and was lost to follow-up. She has recurrent problems with weeping and ulceration of her left lower extremity and has swelling of this for several years. It has been worse for the last 2 months. Past medical history is significant for diabetes mellitus type 2, hypertension, gout, morbid obesity, depressive disorders, hiatal hernia, migraines, status post knee surgery, risk of a cholecystectomy, vaginal hysterectomy and breast biopsy. She is not a smoker. As noted before she has never had a venous duplex study and an arterial ABI study was attempted but the left lower extremity was noncompressible 10/01/2016 -- had a lower extremity venous duplex  reflux evaluation which showed no evidence  of deep vein reflux in the right or left lower extremity, and no evidence of great saphenous vein reflux more than 500 ms in the right or left lower extremity, and the left small saphenous vein is incompetent but no vascular consult was recommended. review of her electronic medical records noted that the ABI was checked in July 2017 where the right ABI was normal limits and the left ABI could not be ascertained due to pain with cuff pressure but the waveforms are within normal limits. her arterial duplex study scheduled for April 27. 10/08/2016 -- the patient has various reasons for not having a compression on and for the last 3 days she has had no compression on her left lower extremity either due to pain or the lack of nursing help. She does not use her juxta lites either. 10/15/2016 -- the patient did not keep her appointment for arterial duplex study on April 27 and I have asked her to reschedule this. Her pain is out of proportion with the physical findings and she continuously fails to wear a compression wraps and cuts them off because she says she cannot tolerate the pain. She does not use her juxta lites either. 10/22/2016 -- he has rescheduled her arterial duplex study to May 21 and her pain today is a bit better. She has not been wearing her juxta lites on her right lower extremity but now understands that she needs to do this. She did tolerate the to press compression wrap on her left lower extremity 10/29/2016 --arterial duplex study is scheduled for next week and overall she has been tolerating her compression wraps and also using her juxta lites on her right lower extremity 11/05/2016 -- the right ABI was 0.95 the left was 1.03. The digit TBI is on the right was 0.83 on the left was 0.92 and she had biphasic flow through these vessels. The impression was that of normal lower extremity arterial study. 11/12/2016 -- her pain is minimal and she  is doing very well overall. 11/26/2016 -- she has got juxta lites and her insurance will not pay for additional dual layer compression stockings. She is going to order some from [place]. 05/12/2017 -- her juxta lites are very old and too big for her and these have not been helping with compression. She did get 20-30 mm compression stockings from [place] but she and her husband are unable to put these on. I believe she will benefit from bilateral Extremit-ease, compression stockings and we will measure her for these today. 05/20/2017 -- lymphedema on the left lower extremity has increased a lot and she has a open ulceration as a result of this. The right lower extremity is looking pretty good. She has decided to by the compression stockings herself and will get reimbursed by the home health, at a later date. 05/27/2017 -- her sciatica is bothering her a lot and she thought her left leg pain was caused due to the compression wrap and hence removed it and has significant lymphedema. There is no inflammation on this left lower extremity. 06/17/17 on evaluation today patient appears to be doing very well and in fact is completely healed in regard to her ulcerations. Unfortunately however she does have continued issues with lymphedema nonetheless. We did order compression garments for her unfortunately she states that the size that she received were large although we ordered medium. Obviously this means she is not getting the optimal compression. She does not have those with her today and therefore we could  not confirm and contact the company on her behalf. Nonetheless she does state that she is going to have her husband bring them by tomorrow so that we can verify and then get in touch with the company. No fevers, chills, nausea, or vomiting noted at this time. Overall patient is doing better otherwise and I'm pleased with the progress she has made. 07/01/17 on evaluation today patient appears to be doing  very well in regard to her bilateral lower extremity she does not have any openings at this point which is excellent news. Overall I'm pleased with how things have progressed up to this time. Since she is doing so well we did order her compression which we are seeing her today to ensure that it fits her properly and everything is doing well in that regard and then subsequently she will be discharged. ============ Old Notes: 03/31/16 patient presents today for evaluation concerning open wounds that she has over the left medial ankle region as well as the left dorsal foot. She has previously had this occur although it has been healed for a number of months after having this for about a year prior until her hospitalization on 01/05/16. At that point in time it appears that she was admitted to the hospital for left lower extremity cellulitis and was placed on vancomycin and Zosyn at that point. Eventually upon discharge on January 15, 2016 she was placed on doxycycline at that point in time. Later on 03/27/16 positive wound culture growing Escherichia coli this was switched to amoxicillin. Currently she tells me that she is having pain radiated to be a 7 out of 10 which can be as high as 10 out of 10 with palpation and manipulation of the wound. This wound appears to be mainly venous in nature due to the bilateral lower extremity venous stasis/lymphedema. This is definitely much worse on her left than the right side. She does have type 1 diabetes mellitus, hypertension, morbid obesity, and is wheelchair dependent.during the course of the hospital stay a blood culture was also obtained and fortunately appeared negative. She also had an x-ray of the tibia/fibula on the left which showed no acute bone abnormality. Her white blood cell count which was performed last on 03/25/16 was 7.3, hemoglobin 12.8, protein 7.1, albumin 3.0. Her urine culture appeared to be negative for any specific organisms. Patient did  have a left lower extremity venous duplex evaluation for DVT . This did not include venous reflux studies but fortunately was negative for DVT Patient also had arterial studies performed which revealed that she had a . normal ABI on the right though this was unable to be performed on the left secondary to pain that she was having around the ankle region due to the wound. However it was stated on report that she had biphasic pulses and apparently good blood flow. ========== 06/03/17 she is here in follow-up evaluation for right lower extremity ulcer. The right lower sure he has healed but she has reopened to the left medial malleolus and dorsal foot with weeping. She is waiting for new compression garments to arrive from home health, the previous compression garments were ill fitting. We will continue with compression bilaterally and follow-up in 2 weeks Readmission: 08/05/17 on evaluation today patient appears to be doing somewhat poorly in regard to her left lower extremity especially although the right lower extremity has a small area which may no longer be open. She has been having a lot of drainage from the left lower  extremity however he tells me that she has not been able to use the EXTREMIT-EASE Compression at this point. She states that she did better and was able to actually apply the Juxta-Lite compression although the wound that she has is too large and therefore really does not compress which is why she cannot wear it at this point. She has no one who can help her put it on regular basis her son can sometimes but he's not able to do it most of the time. I do believe that's why she has begun to weave and have issues as she is currently yet again. No fevers, chills, nausea, or vomiting noted at this time. Patient is no evidence of dementia. 08/12/17 on evaluation today patient appears to still be doing fairly well in regard to the draining areas/weeping areas at this point. With that being  said she unfortunately did go to the ER yesterday due to what was felt to be possibly a cellulitis. They place her on doxycycline by mouth and discharge her home. She definitely was not admitted. With that being said she states she has had more discomfort which has been unusual for her even compared to prior times and she's had infections.08/12/17 on evaluation today patient appears to still be doing fairly well in regard to the draining areas/weeping areas at this point. With that being said she unfortunately did go to the ER yesterday due to what was felt to be possibly a cellulitis. They place her on doxycycline by mouth and discharge her home. She definitely was not admitted. With that being said she states she has had more discomfort which has been unusual for her even compared to prior times and she's had infections. 08/19/17 put evaluation today patient tells me that she's been having a lot of what sounds to be neuropathic type pain in regard to her left lower extremity. She has been using over-the-counter topical bins again which some believe. That in order to apply the she actually remove the wrap we put on her last Wednesday on Thursday. Subsequently she has not had anything on compression wise since that time. The good news is a lot of the weeping areas appear to have closed at this point again I believe she would do better with compression but we are struggling to get her to actually use what she needs to at this point. No fevers, chills, nausea, or vomiting noted at this time. 09/03/17 on evaluation today patient appears to be doing okay in regard to her lower extremities in regard to the lymphedema and weeping. Fortunately she does not seem to show any signs of infection at this point she does have a little bit of weeping occurring in the right medial malleolus area. With that being said this does not appear to be too significant which is good news. 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 She has severe skin damage secondary to both of these features involving the dorsal left foot and medial left ankle and lower leg. Still has open areas in the left anterior foot. The area on the right closed over. She uses her own juxta light stockings. She does not have an arterial issue 09/16/17 on evaluation today patient actually appears to be doing excellent in regard to her bilateral lower extremity swelling. The Juxta-Lite compression wrap seem to be doing very well for her. She has not however been using the portion that goes over her foot. Her left  foot still is draining a little bit not nearly as significant as it has been in the past but still I do believe that she likely needs to utilize the full wrap including the foot portion of this will improve as well. She also has been apparently putting on a significant amount of Vaseline which also think is not helpful for her. I recommended that if she feels she needs something for moisturizer Eucerin will probably be better. 09/30/17 on evaluation today patient presents with several new open areas in regard to her left lower extremity although these appear to be minimal and mainly seem to be more moisture breakdown than anything. Fortunately she does not seem to have any evidence of infection which is great news. She has been tolerating the dressing changes without complication we are using silver alginate on the foot she has been using AB pads to have the legs and using her Juxta- Lite compression which seems to be controlling her swelling very well. Overall I'm pleased with the poor way she has progressed. 10/14/17 on evaluation today patient appears to be doing better in regard to her left lower extremity areas of weeping. She does still have some discomfort although in general this does not appear to be as macerated and I think it is progressing nicely. I do think she still  needs to wear the foot portion of her Juxta- Lite in order to get the most benefit from the wrap obviously. She states she understands. Fortunately there does not appear to be evidence of infection at this time which is great news. 10/28/17 on evaluation today patient appears to be doing excellent in regard to her left lower extremity. She has just a couple areas that are still open and seem to be causing any trouble whatsoever. For that reason I think that she is definitely headed in the right direction the spots are very tiny compared to what we have been dealing with in the past. 11/11/17 on evaluation today patient appears to have a right lateral lower extremity ulcer that has opened since I last saw her. She states this is where the home health nurse that was coming out remove the dressing without wetting the alginate first. Nonetheless I do not know if this is indeed the case or not but more importantly we have not ordered home help to be coming out for her wounds at all. I'm unsure as to why they are coming out and we're gonna have to check on this and get things situated in that regard. With that being said we currently really do not need them to be coming out as the patient has been taking care of her leg herself without complication and no issues. In fact she was doing much better prior to nursing coming out. 11/25/17 on evaluation today patient actually appears to be doing fairly well in regard to her left lower extremity swelling. In fact she has very little area of weeping at this point there's just a small spot on the lateral portion of her right leg that still has me just a little bit more concerned as far as wanting to see this clear up before I discharge her to caring for this at home. Nonetheless overall she has made excellent progress. 12/09/17 on evaluation today patient appears to be doing rather well in regard to her lower extremity edema. She does have some weeping still in the left  lower extremity although the big area we were taking care of two weeks ago  actually has closed and she has another area of weeping on the left lower extremity immediately as well is the top of her foot. She does not currently have lymphedema pumps she has been wearing her compression daily on a regular basis as directed. With that being said I think she may benefit from lymphedema pumps. 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 We had a very difficult time getting and keeping this under control. 12/23/17 on evaluation today patient actually appears to be doing a little bit more poorly in regard to her bilateral lower extremities. She has been tolerating the Juxta-Lite compression wraps. Unfortunately she has two new ulcers on the right lower extremity and left lower Trinity ulceration seems to be larger. Obviously this is not good news. She has been tolerating the dressings without complication. 12/30/17 on evaluation today patient actually appears to be doing much better in regard to her bilateral lower extremity edema. She continues to have some issues with ulcerations and in fact there appears to be one spot on each leg where the wrap may have caused a little bit of a blister which is subsequently opened up at this point is given her pain. Fortunately it does not appear to be any evidence of infection which is good news. No fevers chills noted. 01/13/18 on evaluation today patient appears to be doing rather well in regard to her bilateral lower extremities. The dressings did get kind of stuck as far as the wound beds are concerned but again I think this is mainly due to the fact that she actually seems to be showing signs of healing which is good news. She's not having as much drainage therefore she was having more of the dressing sticking.  Nonetheless overall I feel like her swelling is dramatically down compared to previous. 01/20/18 on evaluation today patient unfortunately though she's doing better in most regards has a large blister on the left anterior lower extremity where she is draining quite significantly. Subsequently this is going to need debridement today in order to see what's underneath and ensure she does not continue to trapping fluid at this location. Nonetheless No fevers, chills, nausea, or vomiting noted at this time. 01/27/18 on evaluation today patient appears to be doing rather well at this point in regard to her right lower extremity there's just a very small area that she still has open at this point. With that being said I do believe that she is tolerating the compression wraps very well in making good progress. Home health is coming out at this point to see her. Her left lower extremity on the lateral portion is actually what still mainly open and causing her some discomfort for the most part 02/10/18 on evaluation today patient actually appears to be doing very well in regard to her right lower extremity were all the ulcers appear to be completely close. In regard to the left lower extremity she does have two areas still open and some leaking from the dorsal surface of her foot but this still seems to be doing much better to me in general. 02/24/18 on evaluation today patient actually appears to be doing much better in regard to her right lower extremity this is still completely healed. Her left lower extremity is also doing much better fortunately she has no evidence of infection. The one area that  is gonna require some debridement is still on the left anterior shin. Fortunately this is not hurting her as badly today. 03/10/18 on evaluation today patient appears to be doing better in some regards although she has a little bit more open area on the dorsal foot and she also has some issues on the medial portion of  the left lower extremity which is actually new and somewhat deep. With that being said there fortunately does not appear to be any significant signs of infection which is good news. No fevers, chills, nausea, or vomiting noted at this time. In general her swelling seems to be doing fairly well which is good news. 03/31/18 on evaluation today patient presents for follow-up concerning her left lower extremity lymphedema. Unfortunately she has been doing a little bit more poorly since I last saw her in regard to the amount of weeping that she is experiencing. She's also having some increased pain in the anterior shin location. Unfortunately I do not feel like the patient is making such good progress at this point a few weeks back she was definitely doing much better. 04/07/18 on evaluation today patient actually appears to be showing some signs of improvement as far as the left lower extremity is concerned. She has been tolerating the dressing changes and it does appear that the Drawtex did better for her. With that being said unfortunately home health is stating that they cannot obtain the Drawtex going forward. Nonetheless we're gonna have to check and see what they may be able to get the alginate they were using was getting stuck in causing new areas of skin being pulled all that with and subsequently weep and calls her to worsen overall this is the first time we've seen improvement at this time. 04/14/18 on evaluation today patient actually appears to be doing rather well at this point there does not appear to be any evidence of infection at this time and she is actually doing excellent in regard to the weeping in fact she almost has no openings remaining even compared to just last week this is a dramatic improvement. No fevers chills noted 04/21/18 evaluation today patient actually appears to be doing very well. She in fact is has a small area on the posterior lower extremity location and she has  a small area on the dorsal surface of her foot that are still open both of which are very close to closing. We're hoping this will be close shortly. She brought her Juxta-Lite wrap with her today hoping that would be able to put her in it unfortunately I don't think were quite at that point yet but we're getting closer. 04/28/18 upon evaluation today patient actually appears to be doing excellent in regard to her left lower extremity ulcer. In fact the region on the posterior lower extremity actually is much smaller than previously noted. Overall I'm very happy with the progress she has made. She again did bring her Juxta-Lite although we're not quite ready for that yet. 05/11/18 upon evaluation today patient actually appears to be doing in general fairly well in regard to her left lower Trinity. The swelling is very well controlled. With that being said she has a new area on the left anterior lower extremity as well as between the first and second toes of her left foot that was not present during the last evaluation. The region of her posterior left lower extremity actually appears to be almost completely healed. T be honest I'm very pleased with o  the way that stands. Nonetheless I do believe that the lotion may be keeping the area to moist as far as her legs are concerned subsequently I'm gonna consider discontinuing that today. 05/26/18 on evaluation today patient appears to be doing rather well in regard to her left lower should be ulcers. In fact everything appears to be close except for a very small area on the left posterior lower extremity. Fortunately there does not appear to be any evidence of infection at this time. Overall very pleased with her progress. 06/02/18 and evaluation today patient actually appears to be doing very well in regard to her lower extremity ulcers. She has one small area that still continues to weep that I think may benefit her being able to justify lotion and user  Juxta-Lite wraps versus continued to wrap her. Nonetheless I think this is something we can definitely look into at this point. 06/23/18 on evaluation today patient unfortunately has openings of her bilateral lower extremities. In general she seems to be doing much worse than when I last saw her just as far as her overall health standpoint is concerned. She states that her discomfort is mainly due to neuropathy she's not having any other issues otherwise. No fevers, chills, nausea, or vomiting noted at this time. 06/30/18 on evaluation today patient actually appears to be doing a little worse in regard to her right lower extremity her left lower extremity of doing fairly well. Fortunately there is no sign of infection at this time. She has been tolerating the dressing changes without complication. Home health did not come out like they were supposed to for the appropriate wrap changes. They stated that they never received the orders from [place] which were fax. Nonetheless we will send a copy of the orders with the patient today as well. 07/07/18 on evaluation today patient appears to be doing much better in regard to lower extremities. She still has several openings bilaterally although since I last saw her her legs did show obvious signs of infection when she later saw her nurse. Subsequently a culture was obtained and she is been placed on Bactrim and Keflex. Fortunately things seem to be looking much better it does appear she likely had an infection. Again last week we'd even discussed it but again there really was not any obvious sign that she had infection therefore we held off on the antibiotics. Nonetheless I'm glad she's doing better today. 07/14/18 on evaluation today patient appears to be doing much better regarding her bilateral lower Trinity's. In fact on the right lower for me there's nothing open at this point there are some dry skin areas at the sites where she had infection. Fortunately there is  no evidence of systemic infection which is excellent news. No fevers chills noted 07/21/18 on evaluation today patient actually appears to be doing much better in regard to her left lower extremity ulcers. She is making good progress and overall I feel like she's improving each time I see her. She's having no pain I do feel like the infection is completely resolved which is excellent news. No fevers, chills, nausea, or vomiting noted at this time. 07/28/18 on evaluation today patient appears to be doing very well in regard to her left lower [employer]. Everything seems to be showing signs of improvement which is excellent news. Overall very pleased with the progress that has been made. Fortunately there's no evidence of active infection at this time also excellent news. 08/04/18 on evaluation today patient  appears to be doing more poorly in regard to her bilateral lower extremities. She has two new areas open up on the right and these were completely closed as of last week. She still has the two spots on the left which in my pinion seem to be doing better. Fortunately there's no evidence of infection again at this point. 08/11/18 on evaluation today patient actually appears to be doing very well in regard to her bilateral lower Trinity wounds that all seem to be doing better and are measures smaller today. Fortunately there's no signs of infection. No fevers, chills, nausea, or vomiting noted at this time. 08/18/18 on evaluation today patient actually appears to be doing about the same inverter bilateral lower extremities. She continues to have areas that blistering open as was drain that fortunately nothing too significant. Overall I feel like Drawtex may have done better for her however compared to the collagen. 08/25/18 on evaluation today patient appears to be doing a little bit more poorly today even compared to last time I saw her. Again I'm not exactly sure why she's making worse progress over  the past several weeks. I'm beginning to wonder if there is some kind of underlying low level infection causing this issue. I did actually take a culture from the left anterior lower extremity but it was a new wound draining quite a bit at this point. Unfortunately she also seems to be having more pain which is what also makes me worried about the possibility of infection. This is despite never erythema noted at this point. 09/01/18 on evaluation today patient actually appears to be doing a little worse even compared to last week in regard to bilateral lower extremities. She did go to the hospital on the 16th was given a dose of IV Zosyn and then discharged with a recommendation to continue with the Bactrim that I previously prescribed for her. Nonetheless she is still having a lot of discomfort she tells me as well at this time. This is definitely unfortunate. No fevers, chills, nausea, or vomiting noted at this time. 09/08/18 on evaluation today patient's bilateral lower extremities actually appear to be shown signs of improvement which is good news. Fortunately there does not appear to be any signs of active infection I think the anabiotic is helping in this regard. Overall I'm very pleased with how she is progressing. 09/15/18 patient was actually seen in ER yesterday due to her legs as well unfortunately. She states she's been having a lot of pain and discomfort as well as a lot of drainage. Upon inspection today the patient does have a lot of swelling and drainage I feel like this is more related to lymphedema and poor fluid control than it is to infection based on what I'm seeing. The physician in the emergency department also doubted that the patient was having a significant infection nonetheless I see no evidence of infection obvious at this point although I do see evidence of poor fluid control. She still not using a compression pumps, she is not elevating due to her lift chair as well as her  hospital bed being broken, and she really is not keeping her legs up as much as they should be and also has been taking off her wraps. All this combined I think has led to poor fluid control and to be honest she may be somewhat volume overloaded in general as well. I recommend that she may need to contact your physician to see if a prescription for  a diuretic would be beneficial in their opinion. As long as this is safe I think it would likely help her. 09/29/18 on evaluation today patient's left lower extremity actually appears to be doing quite a bit better. At least compared to last time that I saw her. She still has a large area where she is draining from but there's a lot of new skin speckled trout and in fact there's more new skin that there are open areas of weeping and drainage at this point. This is good news. With regard to the right lower extremity this is doing much better with the only open area that I really see being a dry spot on the right lateral ankle currently. Fortunately there's no signs of active infection at this time which is good news. No fevers, chills, nausea, or vomiting noted at this time. The patient seems somewhat stressed and overwhelmed during the visit today she was very lethargic as such. She does and she is not taking any pain medications at this point. Apparently according to her husband are also in the process of moving which is probably taking its toll on her as well. 10/06/18 on evaluation today patient appears to be doing rather well in regard to her lower extremities compared to last evaluation. Fortunately there's no signs of active infection. She tells me she did have an appointment with her primary. Nonetheless he was concerned that the wounds were somewhat deep based on pictures but we never actually saw her legs. She states that he had her somewhat worried due to the fact that she was fearing now that she was [place] have to have an amputation. With that being  said based on what I'm seeing check she looks better this week that she has the last two times I've seen her with much less drainage I'm actually pleased in this regard. That doesn't mean that she's out of the water but again I do not think what the point of talking about education at all in regard to her leg. She is very happy to hear this. She is also not having as much pain as she was having last week. 10/13/18 unfortunately on evaluation today patient still continues to have a significant amount of drainage she's not letting home health actually apply the compression dressings at this point. She's trying to use of Juxta-Lite of the top of Kerlex and the second layer of the three layer compression wrap. With that being said she just does not seem to be making as good a progress as I would expect if she was having the compression applied and in place on a regular basis. No fevers, chills, nausea, or vomiting noted at this time. 10/20/18 on evaluation today patient appears to be doing a little better in regard to her bilateral lower extremity ulcers. In fact the right lower extremity seems to be healed she doesn't even have any openings at this point left lower extremity though still somewhat macerated seems to be showing signs of new skin growth at multiple locations throughout. Fortunately there's no evidence of active infection at this time. No fevers, chills, nausea, or vomiting noted at this time. 10/27/18 on evaluation today patient appears to be doing much better in regard to her left lower Trinity ulcer. She's been tolerating the laptop complication and has minimal drainage noted at this point. Fortunately there's no signs of active infection at this time. No fevers, chills, nausea, or vomiting noted at this time. 11/03/18 on evaluation today patient actually  appears to be doing excellent in regard to her left lower extremity. She is having very little drainage at this point there does not appear  to be any significant signs of infection overall very pleased with how things have gone. She is likewise extremely pleased still and seems to be making wonderful progress week to week. I do believe antibiotics were helpful for her. Her primary care provider did place on amateur clean since I last saw her. 11/17/18 on evaluation today patient appears to be doing worse in regard to her bilateral lower extremities at this point. She is been tolerating the dressing changes without complication. With that being said she typically takes the Coban off fairly quickly upon arriving home even after being seen here in the clinic and does not allow home health reapply command as part of the dressing at home. Therefore she said no compression essentially since I last saw her as best I can tell. With that being said I think it shows and how much swelling she has in the open wounds that are noted at this point. Fortunately there's no signs of infection but unfortunately if she doesn't get this under control I think she will end up with infection and more significant issues. 11/24/18 on evaluation today patient actually appears to be doing somewhat better in regard to her bilateral lower extremities. 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 Nonetheless that is doing better currently so she should be able to attempt reinitiating the lymphedema pumps at this time. No fevers, chills, nausea, or vomiting noted at this time. 12/01/18 upon evaluation today patient's left lower extremity appears to be doing quite well unfortunately her right lower extremity is not doing nearly as well. She has been tolerating the dressing changes without complication unfortunately she did not keep a wrap on the right at this time. Nonetheless I believe this has led to increased swelling and weeping in  the world is actually much larger than during the last evaluation with her. 12/08/18 on evaluation today patient appears to be doing about the same at this point in regard to her right lower extremity. There is some more palatable to touch I'm concerned about the possibility of there being some infection although I think the main issue is she's not keeping her compression wrap on which in turn is not allowing this area to heal appropriately. 12/22/18 on evaluation today patient appears to be doing better in regard to left lower extremity unfortunately significantly worse in regard to the right lower extremity. The areas of blistering and necrotic superficial tissue have spread and again this does not really appear to be signs of infection and all she just doesn't seem to be doing nearly as well is what she has been in the past. Overall I feel like the Augmentin did absolutely nothing for her she doesn't seem to have any infection again I really didn't think so last time either is more of a potential preventative measure and hoping that this would make some difference but I think the main issue is she's not wearing her compression. She tells me she cannot wear the [place] been we put on she takes it off pretty much upon getting home. Subsequently she worshiped Juxta-Lite when I questioned her about how often she wears it this is no more than  three hours a day obviously that leaves 21 hours that she has no compression and this is obviously not doing well for her. Overall I'm concerned that if things continue to worsen she is at great risk of both infection as well as losing her leg. 01/05/19 on evaluation today patient appears to be doing well in regard to her left lower extremity which he is allowing [place] to wrap and not so well with regard to her right lower extremity which she is not allowing [place] to really wrap and keep the wrap on. She states that it hurts too badly whenever it's wrapped and she ends up  having to take it off. She's been using the Juxta-Lite she tells me up to six hours a day although I question whether or not that's really been the case to be honest. Previously she told me three hours today nonetheless obviously the legs as long as the wrap is doing great when she is not is doing much more poorly. 01/12/2019 on evaluation today patient actually appears to be doing a little better in my opinion with regard to her right lower extremity ulcer. She has a small open area on the left lower extremity unfortunately but again this I think is part of the normal fluctuation of what she is going to have to expect with regard to her legs especially when she is not using her lymphedema pumps on a regular basis. Subsequently based on what I am seeing today I think that she does seem to be doing slightly better with regard to her right lower extremity she did see her primary care provider on Monday they felt she had an infection and placed her on 2 antibiotics. Both Cipro and clindamycin. Subsequently again she seems possibly to be doing a little bit better in regards to the right lower extremity she also tells me however she has been wearing the compression wrap over the past week since I spoke with her as well that is a Kerlix and Coban wrap on the right. No fevers, chills, nausea, vomiting, or diarrhea. 01/19/2019 on evaluation today patient appears to be doing better with regard to her bilateral lower extremities especially the right. I feel like the compression has been beneficial for her which is great news. She did get a call from her primary care provider on her way here today telling her that she did have methicillin- resistant Staphylococcus aureus and he was calling in a couple new antibiotics for her including a ointment to be applied she tells me 3 times a day. With that being said this sounds like likely to be Bactroban which I think could be applied with each dressing/wrap change but I  would not be able to accommodate her applying this 3 times a day. She is in agreement with the least doing this we will add that to her orders today. 01/26/2019 on evaluation today patient actually appears to be doing much better with regard to her right lower extremity. Her left lower extremity is also doing quite well all things considering. Fortunately there is no evidence of active infection at this time. No fevers, chills, nausea, vomiting, or diarrhea. 02/02/2019 on evaluation today patient appears to be doing much better compared to her last evaluation. Little by little off like her right leg is returning more towards normal. There does not appear to be any signs of active infection and overall she seems to be doing quite well which is great news. I am very pleased in this  regard. No fevers, chills, nausea, vomiting, or diarrhea. 02/09/2019 upon evaluation today patient appears to be doing better with regard to her bilateral lower extremities. She has been tolerating the dressing changes without complication. Fortunately there is no signs of active infection at this time. No fevers, chills, nausea, vomiting, or diarrhea. 02/23/2019 on evaluation today patient actually appears to be doing quite well with regard to her bilateral lower extremities. She has been tolerating the dressing changes without complication. She is even used her pumps one time and states that she really felt like it felt good. With that being said she seems to be in good spirits and her legs appear to be doing excellent. 03/09/2019 on evaluation today patient appears to be doing well with regard to her right lower extremity there are no open wounds at this time she is having some discomfort but I feel like this is more neuropathy than anything. With regard to her left lower extremity she had several areas scattered around that she does have some weeping and drainage from but again overall she does not appear to be having any  significant issues and no evidence of infection at this time which is good news. 03/23/2019 on evaluation today patient appears to be doing well with regard to her right lower extremity which she tells me is still close she is using her juxta light here. Her left lower extremity she mainly just has an area on the foot which is still slightly draining although this also is doing great. Overall very pleased at this time. 04/06/2019 patient appears to be doing a little bit worse in regard to her left lower extremity upon evaluation today. She feels like this could be becoming infected again which she had issues with previous. Fortunately there is no signs of systemic infection but again this is always a struggle with her with her legs she will go from doing well to not so well in a very short amount of time. 04/20/2019 on evaluation today patient actually appears to be doing quite well with regard to her right lower extremity I do not see any signs of active infection at this time. Fortunately there is no fever chills noted. She is still taking the antibiotics which I prescribed for her at this point. In regard to the left lower extremity I do feel like some of these areas are better although again she still is having weeping from several locations at this time. 04/27/2019 on evaluation today patient appears to be doing about the same if not slightly worse in regard to her left lower extremity ulcers. She tells me when questioned that she has been sleeping in her Hoveround chair in fact she tells me she falls asleep without even knowing it. I think she is spending a whole lot of time in the chair and less time walking and moving around which is not good for her legs either. On top of that she is in a seated position which is also the worst position she is not really elevating her legs and she is also not using her lymphedema pumps. All this is good to contribute to worsening of her condition in  general. 05/18/2019 on evaluation today patient appears to be doing well with regard to her lower extremity on the right in fact this is showing no signs of any open wounds at this time. On the left she is continuing to have issues with areas that do drain. Some of the regions have healed and there are  couple areas that have reopened. She did go to the ER per the patient according to recommendations from the home health nurse due to what she was seen when she came out on 05/13/2019. Subsequently she felt like the patient needed to go to the hospital due to the fact that again she was having "milky white discharge" from her leg. Nonetheless she had and then was placed on doxycycline and subsequently seems to be doing better. 06/01/2019 upon evaluation today patient appears to be doing really in my opinion about the same. I do not see any signs of active infection which is good news. Overall she still has wounds over the bilateral lower extremities she has reopened on the right but this appears to be more of a crack where there is weeping/edema coming from the region. I do not see any evidence of infection at either site based on what I visualized today. 07/13/2019 upon evaluation today patient appears to be doing a little worse compared to last time I saw her. She since has been in the hospital from 06/21/2019 through 06/29/2019. This was secondary to having Covid. During that time they did apply lotion to her legs which unfortunately has caused her to develop a myriad of open wounds on her lower extremities. Her legs do appear to be doing better as far as the overall appearance is concerned but nonetheless she does have more open and weeping areas. 07/27/2019 upon evaluation today patient appears to be doing more poorly to be honest in regard to her left lower extremity in particular. There is no signs of systemic infection although I do believe she may have local infection. She notes she has been having  a lot of blue/green drainage which is consistent potentially with Pseudomonas. That may be something that we need to consider here as well. The doxycycline does not seem to have been helping. 08/03/2019 upon evaluation today patient appears to be doing a little better in my opinion compared to last week's evaluation. Her culture I did review today and she is on appropriate medications to help treat the Enterobacter that was noted. Overall I feel like that is good news. With that being said she is unfortunately continuing to have a lot of drainage and though it is doing better I still think she has a long ways to go to get things dried up in general. Fortunately there is no signs of systemic infection. 08/10/2019 upon evaluation today patient appears to be doing may be slightly better in regard to her left lower extremity the right lower extremity is doing much better. Fortunately there is no signs of infection right now which is good news. No fevers, chills, nausea, vomiting, or diarrhea. 08/24/2019 on evaluation today patient appears to be doing slightly better in regard to her lower extremities. The left lower extremity seems to be healed the right lower extremity is doing better though not completely healed as far as the openings are concerned. She has some generalized issues here with edema and weeping secondary to her lymphedema though again I do believe this is little bit drier compared to prior weeks evaluations. In general I am very pleased with how things seem to be progressing. No fevers, chills, nausea, vomiting, or diarrhea. 08/31/2019 upon evaluation today patient actually seems to making some progress here with regard to the left lower extremity in particular. She has been tolerating the dressing changes without complication. Fortunately there is no signs of active infection at this time. No fevers, chills, nausea,  vomiting, or diarrhea. She did see Dr. Doren Custard and he did note that she did have  a issue with the left great saphenous vein and the small saphenous vein in the leg. With that being said he was concerned about the possibility of laser ablation not being extremely successful. He also mentioned a small risk of DVT associated with the procedure. However if the wounds do not continue to improve he stated that that would probably be the way to go. Fortunately the patient's legs do seem to be doing much better. 09/07/2019 upon evaluation today patient appears to be doing better with regard to her lower extremities. She has been tolerating the dressing changes without complication. With that being said she is showing signs of improvement and overall very pleased. There are some areas on her leg that I think we do need to debride we discussed this last week the patient is in agreement with doing that as long as it does not hurt too badly. 09/14/2019 upon evaluation today patient appears to be doing decently well with regard to her left lower extremity. She is not having near as much weeping as she has had in the past things seem to be drying up which is good news. There is no signs of active infection at this time. 09/21/19 upon evaluation today patient appears to be doing better in regard overall to her bilateral lower extremities. She again has less open than she did previous and each week I feel like this is getting better. Fortunately there is no signs of active infection at this time. No fevers, chills, nausea, vomiting, or diarrhea. 09/28/2019 upon evaluation today patient actually appears to be showing signs of improvement with regard to her left lower extremity. Unfortunately the right medial lower extremity around the ankle region has reopened to some degree but this appears to be minimal still which is good news. There is no signs of active infection at this time which is also good news. 10/12/2019 upon evaluation today patient appears to be doing okay with regard to her bilateral  lower extremities today. The right is a little bit worse then last evaluation 2 weeks ago. The left is actually doing a little better in my opinion. Overall there is no signs of active infection at this time that I see. Obviously that something we have to keep a close eye on she is very prone to this with the significant and multiple openings that she has over the bilateral lower extremities. 10/19/2019 upon evaluation today patient appears to be doing about the best that I have seen her in quite some time. She has been tolerating the dressing changes without complication. There does not appear to be any signs of active infection and overall I am extremely happy with the way her legs appeared. She is drying up quite nicely and overall is having less pain. 11/09/2019 upon evaluation today patient appears to be doing better in regard to her wounds. She seems to be drying up more and more each time I see her this is just taking a very long time. Fortunately there is no signs of active infection at this time. 11/23/2019 upon evaluation today patient actually appears to be doing excellent in regard to her lower extremities at this point compared to where she has been. Fortunately there is no signs of active infection at this time. She did go to the hospital last week for nausea and vomiting completely unrelated to her wounds. Fortunately she is doing better she was  given some Reglan and got better. She had associated abdominal pain but they never found out what was going on. 12/07/2019 upon evaluation today patient appears to be doing well for the most part in regard to her legs. She unfortunately has not been keeping the Cob[place] portion of her wraps on therefore the compression has not really been sufficient for what it is supposed to be. Nonetheless she tells me that it just hurt too bad therefore she removed it. 12/21/2019 upon evaluation today patient actually appears to be doing quite well with regard to her  legs. I do feel like she has been making progress which is great news and overall there is no signs of active infection at this time. No fevers, chills, nausea, vomiting, or diarrhea. 01/04/2020 upon evaluation today patient presents for follow-up concerning her lower extremity edema bilaterally. She still has open wounds she has not been using her lymphedema pumps. She is also not been utilizing her compression wraps appropriately she tends to unwrap them, take them off, or states that they hurt. Obviously the reason they hurt is because her legs start to swell but the issue is if she would use her compression/lymphedema pumps regularly she would not swell and she would have the pain. Nonetheless she has not even picked them up once honestly over the past several months and may be even as much as in the past year based on my opinion and what have seen. She tells me today that after last week when I talked about this with her specifically actually that was 2 weeks ago that she "forgot". 8//21 on evaluation today patient appears to be doing a little better in regard to her legs bilaterally. Fortunately there is no signs of active infection at this time. She tells me that she used her lymphedema pumps all of one time over the past 2 weeks since I last saw her. She tells me that she has been too busy in order to continue to use these. 02/01/2020 on evaluation today patient appears to be doing some better in regard to her wounds in general in her legs. We felt the right was healed although is not completely it does appear to be doing better she tells me she has been using her lymphedema pumps that she has had this six times since I last saw her. Obviously the more she does that the better she would do my opinion 02/15/2020 upon evaluation today patient appears to be doing about the same in regard to her legs. She tells me that she is pumping I'm still not sure how much she does to be perfectly honest. However  even if she does a little bit here and there I guess that is better than nothing. Fortunately there is no sign of active infection at this time which is great news. No fevers, chills, nausea, vomiting, or diarrhea. 02/29/2020 on evaluation today patient actually appears to be doing quite well all things considered this week. She has been tolerating the dressing changes without complication. Fortunately there is no signs of active infection at this time. No fevers, chills, nausea, vomiting, or diarrhea. 03/14/2020 upon evaluation today patient appears to be doing really about the same in regard to her legs. There is no signs of improvement overall and she as noted from home health does not appear to be elevating her legs he can get into her lift chair. There is too much stuff piled up on it the patient tells me. She also tells me  she cannot really use her pumps effectively due to the fact that she cannot have any space to get them on. Finally she is also not really elevating her legs because she is not sleeping in her bed she is sleeping in her chair currently and again overall I think everything that she is done in combination has been exactly the wrong thing for what she needs for her legs. 04/04/2020 upon evaluation today patient appears to be doing well at this time with regard to her legs. She is actually been pumping, keeping her wraps on, and to be honest she seems to be doing dramatically better the right leg is excellent the left leg is also excellent and measuring much smaller than previous. 04/18/2020 upon evaluation today patient actually is continue to make good progress in regard to her lower extremities bilaterally. Everything is improving and less wet that has been in the past overall I am extremely pleased with where things stand and I think that she is making great progress. The patient tells me she still continue to use her compression pumps 05/02/2020 on evaluation today patient appears  to be doing well at this time in regard to her left leg which is showing signs of drying up. With that being said she does have a lot of lymphedema type crusty skin around the toes of her left foot and the right medial ankle which has opened at this point. Fortunately there is no signs of active infection systemically at this point or even locally for that matter. 05/23/2020 on evaluation today patient appears to be doing well with regard to her lower extremities. Fortunately there is no signs of active infection at this time. No fever chills noted. She has been very depressed however she tells me. 06/06/2020 patient came in today for evaluation in regard to her bilateral lower extremity ulcerations. With that being said she came in feeling okay and actually laughing and joking around with the staff checking her in. Subsequently however she had a coughing spell and following the coughing spell it was a dramatic conversion from being jovial and joking around to being extremely short of breath her vital signs actually dropped in regard to her blood pressure from around 175 to down around 125 for systolic and from around 96 diastolic down to around 70. With that being said she also accompanied this with an increase in her respiratory rate which was also quite significant. Nonetheless I actually upon going into see her was extremely worried we called EMS to have her transported to the ER for further evaluation and treatment. She continued until EMS got here to be extremely short of breath even on 6 L of oxygen. Her oxygen saturation did come up to 99% but overall it was only 95 before which was not terrible she did feel like the oxygen helped her feel somewhat better however. She has a history of asthma but again even the coughing spell really should not have done this degree of alteration in her demeanor from where she was just before to after the coughing spell. She tells [place] however she has been feeling  somewhat abnormal since Friday although she could not really pinpoint exactly what was going on. 06/27/2020 plan evaluation today patient appears to be doing okay in regard to her leg ulcers. She is really not showing a lot of improvement to be honest and she still has a lot of weeping. With that being said after I last saw her we called EMS to  take her to the hospital she actually did not go she went to see her primary care provider who according to the patient have not identified and read through the note states that everything checked out okay. Nonetheless the patient has continued to have bouts where she gets very short of breath for seemingly no reason whatsoever. That happened even the same night that I saw her last time. This was after she refused transport to the hospital. With that being said she also tells me that just getting dressed to come here is quite a chore which is putting on her shirt causing her to have to stop to catch her breath. Obviously this is not normal and I feel like there is something going on. She may need a referral to be seen by cardiology ASAP. She does see her primary care provider early next week she tells me Electronic Signature(s) Signed: 06/27/2020 1:25:41 PM By: [name] PA-C Entered By: [name] on 06/27/2020 13:25:41 -------------------------------------------------------------------------------- Physical Exam Details Patient Name: Date of Service: ANDRETTA, ERGLE 06/27/2020 12:30 PM Medical Record Number: [id number] Patient Account Number: [id number] Date of Birth/Sex: Treating RN: 04-01-1951 (70 y.o. [name] Primary Care Provider: [name] Other Clinician: Referring Provider: Treating Provider/Extender: Darlen Round in Treatment: 151 Constitutional Obese and well-hydrated in no acute distress. Psychiatric this patient is able to make decisions and demonstrates good insight into disease process. Alert  and Oriented x 3. pleasant and cooperative. Notes Upon inspection patient's wound bed actually showed signs of doing okay there were no major ulcerations on the lower extremities though she has a lot of weeping noted in general. Her respirations are unfortunately though okay right now very labored when she moves around or when she has to do any type of physical activity or exertion. Nonetheless I feel that cardiology may be a good first step to evaluate what is going on here. Electronic Signature(s) Signed: 06/27/2020 1:26:22 PM By: [name] PA-C Entered By: [name] on 06/27/2020 13:26:22 -------------------------------------------------------------------------------- Physician Orders Details Patient Name: Date of Service: [name]. 06/27/2020 12:30 PM Medical Record Number: [id number] Patient Account Number: [id number] Date of Birth/Sex: Treating RN: 02-15-1951 (70 y.o. [name] Primary Care Provider: [name] Other Clinician: Referring Provider: Treating Provider/Extender: Darlen Round in Treatment: [phone] Verbal / Phone Orders: No Diagnosis Coding ICD-10 Coding Code Description E11.622 Type 2 diabetes mellitus with other skin ulcer I89.0 Lymphedema, not elsewhere classified I87.331 Chronic venous hypertension (idiopathic) with ulcer and inflammation of right lower extremity I87.332 Chronic venous hypertension (idiopathic) with ulcer and inflammation of left lower extremity L97.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 leg with fat layer exposed L97.822 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 I10 Essential (primary) hypertension E66.01 Morbid (severe) obesity due to excess calories F41.8 Other specified anxiety disorders R53.1 Weakness Follow-up Appointments Return Appointment in 2  weeks. Bathing/ Shower/ Hygiene May shower and wash wound with soap and water. - with dressing changes, wash both legs with wash cloth and soap and water with dressing changes Edema Control - Lymphedema / SCD / Other Bilateral Lower Extremities Lymphedema Pumps. Use Lymphedema pumps on leg(s) 2-3 times a day for 45-60 minutes. If wearing any wraps or hose, do not remove them. Continue exercising as instructed. Elevate legs to the level  of the heart or above for 30 minutes daily and/or when sitting, a frequency of: Avoid standing for long periods of time. Exercise regularly Additional Orders / Instructions Other: - Follow up with primary care MD for shortness of breath Home Health No change in wound care orders this week; continue Home Health for wound care. May utilize formulary equivalent dressing for wound treatment orders unless otherwise specified. Other Home Health Orders/Instructions: - [facility] Wound #61 - Lower Leg Wound Laterality: Left, Circumferential Peri-Wound Care: Zinc Oxide Ointment 30g tube 2 x Per Week/30 Days Discharge Instructions: Apply Zinc Oxiide to weeping areas with each dressing change Peri-Wound Care: Sween Lotion (Moisturizing lotion) (Home Health) 2 x Per Week/30 Days Discharge Instructions: Apply moisturizing lotion as directed Prim Dressing: KerraCel Ag Gelling Fiber Dressing, 4x5 in (silver alginate) (Home Health) 2 x Per Week/30 Days ary Discharge Instructions: Apply silver alginate to wound bed as instructed Secondary Dressing: Woven Gauze Sponge, Non-Sterile 4x4 in (Home Health) 2 x Per Week/30 Days Discharge Instructions: Apply over primary dressing as directed. Secondary Dressing: ABD Pad, 5x9 (Home Health) 2 x Per Week/30 Days Discharge Instructions: Apply over primary dressing as directed. Compression Wrap: ThreePress (3 layer compression wrap) (Home Health) 2 x Per Week/30 Days Discharge Instructions: Apply three layer  compression as directed. Pad bend of ankle with foam or ABD pad. Wound #64 - Foot Wound Laterality: Dorsal, Left Peri-Wound Care: Zinc Oxide Ointment 30g tube 2 x Per Week/30 Days Discharge Instructions: Apply Zinc Oxiide to weeping areas with each dressing change Peri-Wound Care: Sween Lotion (Moisturizing lotion) (Home Health) 2 x Per Week/30 Days Discharge Instructions: Apply moisturizing lotion as directed Prim Dressing: KerraCel Ag Gelling Fiber Dressing, 4x5 in (silver alginate) (Home Health) 2 x Per Week/30 Days ary Discharge Instructions: Apply silver alginate to wound bed as instructed Secondary Dressing: Woven Gauze Sponge, Non-Sterile 4x4 in (Home Health) 2 x Per Week/30 Days Discharge Instructions: Apply over primary dressing as directed. Secondary Dressing: ABD Pad, 5x9 (Home Health) 2 x Per Week/30 Days Discharge Instructions: Apply over primary dressing as directed. Compression Wrap: ThreePress (3 layer compression wrap) (Home Health) 2 x Per Week/30 Days Discharge Instructions: Apply three layer compression as directed. Pad bend of ankle with foam or ABD pad. Wound #67 - Lower Leg Wound Laterality: Right, Medial Peri-Wound Care: Zinc Oxide Ointment 30g tube 2 x Per Week/30 Days Discharge Instructions: Apply Zinc Oxiide to weeping areas with each dressing change Peri-Wound Care: Sween Lotion (Moisturizing lotion) (Home Health) 2 x Per Week/30 Days Discharge Instructions: Apply moisturizing lotion as directed Prim Dressing: KerraCel Ag Gelling Fiber Dressing, 4x5 in (silver alginate) (Home Health) 2 x Per Week/30 Days ary Discharge Instructions: Apply silver alginate to wound bed as instructed Secondary Dressing: Woven Gauze Sponge, Non-Sterile 4x4 in (Home Health) 2 x Per Week/30 Days Discharge Instructions: Apply over primary dressing as directed. Secondary Dressing: ABD Pad, 5x9 (Home Health) 2 x Per Week/30 Days Discharge Instructions: Apply over primary dressing as  directed. Compression Wrap: ThreePress (3 layer compression wrap) (Home Health) 2 x Per Week/30 Days Discharge Instructions: Apply three layer compression as directed. Pad bend of ankle with foam or ABD pad. Wound #68 - Lower Leg Wound Laterality: Left, Anterior Peri-Wound Care: Zinc Oxide Ointment 30g tube 2 x Per Week/30 Days Discharge Instructions: Apply Zinc Oxiide to weeping areas with each dressing change Peri-Wound Care: Sween Lotion (Moisturizing lotion) (Home Health) 2 x Per Week/30 Days Discharge Instructions: Apply moisturizing lotion as directed Prim Dressing:  KerraCel Ag Gelling Fiber Dressing, 4x5 in (silver alginate) (Home Health) 2 x Per Week/30 Days ary Discharge Instructions: Apply silver alginate to wound bed as instructed Secondary Dressing: Woven Gauze Sponge, Non-Sterile 4x4 in (Home Health) 2 x Per Week/30 Days Discharge Instructions: Apply over primary dressing as directed. Secondary Dressing: ABD Pad, 5x9 (Home Health) 2 x Per Week/30 Days Discharge Instructions: Apply over primary dressing as directed. Compression Wrap: ThreePress (3 layer compression wrap) (Home Health) 2 x Per Week/30 Days Discharge Instructions: Apply three layer compression as directed. Pad bend of ankle with foam or ABD pad. Electronic Signature(s) Signed: 06/27/2020 5:15:08 PM By: [name] PA-C Signed: 06/27/2020 5:15:42 PM By: [name] RN, BSN Entered By: [name] on 06/27/2020 13:26:36 -------------------------------------------------------------------------------- Problem List Details Patient Name: Date of Service: [name]. 06/27/2020 12:30 PM Medical Record Number: [id number] Patient Account Number: [id number] Date of Birth/Sex: Treating RN: 10/24/1950 (70 y.o. Martyn Malay, Vaughan Basta Primary Care Provider: [name] Other Clinician: Referring Provider: Treating Provider/Extender: Darlen Round in Treatment: [phone] Active  Problems ICD-10 Encounter Code Description Active Date MDM Diagnosis E11.622 Type 2 diabetes mellitus with other skin ulcer 08/05/2017 No Yes I89.0 Lymphedema, not elsewhere classified 08/05/2017 No Yes I87.331 Chronic venous hypertension (idiopathic) with ulcer and inflammation of right 08/05/2017 No Yes lower extremity I87.332 Chronic venous hypertension (idiopathic) with ulcer and inflammation of left 08/05/2017 No Yes lower extremity L97.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 leg with fat layer 08/05/2017 No Yes exposed L97.822 Non-pressure chronic ulcer of other part of left lower leg with fat layer exposed2/20/2019 No Yes L97.522 Non-pressure chronic ulcer of other part of left foot with fat layer exposed 06/01/2019 No Yes I10 Essential (primary) hypertension 08/05/2017 No Yes E66.01 Morbid (severe) obesity due to excess calories 08/05/2017 No Yes F41.8 Other specified anxiety disorders 08/05/2017 No Yes R53.1 Weakness 08/05/2017 No Yes Inactive Problems Resolved Problems Electronic Signature(s) Signed: 06/27/2020 12:54:26 PM By: [name] PA-C Entered By: [name] on 06/27/2020 12:54:26 -------------------------------------------------------------------------------- Progress Note Details Patient Name: Date of Service: [name]. 06/27/2020 12:30 PM Medical Record Number: [id number] Patient Account Number: [id number] Date of Birth/Sex: Treating RN: 1950-10-22 (70 y.o. [name] Primary Care Provider: [name] Other Clinician: Referring Provider: Treating Provider/Extender: Darlen Round in Treatment: 151 Subjective Chief Complaint Information obtained from Patient Bilateral reoccurring LE ulcers History of Present Illness (HPI) this patient has been seen a couple of times before and returns with recurrent problems to her right and left lower extremity with swelling and weeping ulcerations due to not wearing  her compression stockings which she had been advised to do during her last discharge, at the end of June 2018. During her last visit the patient had had normal arterial blood flow and her venous reflux study did not necessitate any surgical intervention. She was recommended compression and elevation and wound care. After prolonged treatment the patient was completely healed but she has been noncompliant with wearing or compressions.. She was here last week with an outpatient return visit planned but the patient came in a very poor general condition with altered mental status and was rushed to the ER on my request. With a history of hypertension, diabetes, TIA and right-sided weakness she was set up for an MRI on her brain and cervical spine and was sent to [facility]. Getting an MRI done was very difficult but once the workup was done she was  found not to have any spinal stenosis, epidural abscess or hematoma or discitis. This was radiculopathy to be treated as an outpatient and she was given a follow-up appointment. Today she is feeling much better alert and oriented and has come to reevaluate her bilateral lower extremity lymphedema and ulceration 03/25/2017 -- she was admitted to the hospital on 03/16/2017 and discharged on 03/18/2017 with left leg cellulitis and ulceration. She was started on vancomycin and Zosyn and x-ray showed no bony involvement. She was treated for a cellulitis with IV antibiotics changed to Rocephin and Flagyl and was discharged on oral Keflex and doxycycline to complete a 7 day course. Last hemoglobin A1c was 7.1 and her other ailments including hypertension got asthma were appropriately treated. 05/06/2017 -- she is awaiting the right size of compression stockings from [place] but other than that has been doing well. ====== Old notes 70 year old patient was seen one time last October and was lost to follow-up. She has recurrent problems with weeping and ulceration of her  left lower extremity and has swelling of this for several years. It has been worse for the last 2 months. Past medical history is significant for diabetes mellitus type 2, hypertension, gout, morbid obesity, depressive disorders, hiatal hernia, migraines, status post knee surgery, risk of a cholecystectomy, vaginal hysterectomy and breast biopsy. She is not a smoker. As noted before she has never had a venous duplex study and an arterial ABI study was attempted but the left lower extremity was noncompressible 10/01/2016 -- had a lower extremity venous duplex reflux evaluation which showed no evidence of deep vein reflux in the right or left lower extremity, and no evidence of great saphenous vein reflux more than 500 ms in the right or left lower extremity, and the left small saphenous vein is incompetent but no vascular consult was recommended. review of her electronic medical records noted that the ABI was checked in July 2017 where the right ABI was normal limits and the left ABI could not be ascertained due to pain with cuff pressure but the waveforms are within normal limits. her arterial duplex study scheduled for April 27. 10/08/2016 -- the patient has various reasons for not having a compression on and for the last 3 days she has had no compression on her left lower extremity either due to pain or the lack of nursing help. She does not use her juxta lites either. 10/15/2016 -- the patient did not keep her appointment for arterial duplex study on April 27 and I have asked her to reschedule this. Her pain is out of proportion with the physical findings and she continuously fails to wear a compression wraps and cuts them off because she says she cannot tolerate the pain. She does not use her juxta lites either. 10/22/2016 -- he has rescheduled her arterial duplex study to May 21 and her pain today is a bit better. She has not been wearing her juxta lites on her right lower extremity but now  understands that she needs to do this. She did tolerate the to press compression wrap on her left lower extremity 10/29/2016 --arterial duplex study is scheduled for next week and overall she has been tolerating her compression wraps and also using her juxta lites on her right lower extremity 11/05/2016 -- the right ABI was 0.95 the left was 1.03. The digit TBI is on the right was 0.83 on the left was 0.92 and she had biphasic flow through these vessels. The impression was that of normal  lower extremity arterial study. 11/12/2016 -- her pain is minimal and she is doing very well overall. 11/26/2016 -- she has got juxta lites and her insurance will not pay for additional dual layer compression stockings. She is going to order some from [facility]. 05/12/2017 -- her juxta lites are very old and too big for her and these have not been helping with compression. She did get 20-30 mm compression stockings from [facility] but she and her husband are unable to put these on. I believe she will benefit from bilateral Extremit-ease, compression stockings and we will measure her for these today. 05/20/2017 -- lymphedema on the left lower extremity has increased a lot and she has a open ulceration as a result of this. The right lower extremity is looking pretty good. She has decided to by the compression stockings herself and will get reimbursed by the home health, at a later date. 05/27/2017 -- her sciatica is bothering her a lot and she thought her left leg pain was caused due to the compression wrap and hence removed it and has significant lymphedema. There is no inflammation on this left lower extremity. 06/17/17 on evaluation today patient appears to be doing very well and in fact is completely healed in regard to her ulcerations. Unfortunately however she does have continued issues with lymphedema nonetheless. We did order compression garments for her unfortunately she states that the size that she received were  large although we ordered medium. Obviously this means she is not getting the optimal compression. She does not have those with her today and therefore we could not confirm and contact the company on her behalf. Nonetheless she does state that she is going to have her husband bring them by tomorrow so that we can verify and then get in touch with the company. No fevers, chills, nausea, or vomiting noted at this time. Overall patient is doing better otherwise and I'm pleased with the progress she has made. 07/01/17 on evaluation today patient appears to be doing very well in regard to her bilateral lower extremity she does not have any openings at this point which is excellent news. Overall I'm pleased with how things have progressed up to this time. Since she is doing so well we did order her compression which we are seeing her today to ensure that it fits her properly and everything is doing well in that regard and then subsequently she will be discharged. ============ Old Notes: 03/31/16 patient presents today for evaluation concerning open wounds that she has over the left medial ankle region as well as the left dorsal foot. She has previously had this occur although it has been healed for a number of months after having this for about a year prior until her hospitalization on 01/05/16. At that point in time it appears that she was admitted to the hospital for left lower extremity cellulitis and was placed on vancomycin and Zosyn at that point. Eventually upon discharge on January 15, 2016 she was placed on doxycycline at that point in time. Later on 03/27/16 positive wound culture growing Escherichia coli this was switched to amoxicillin. Currently she tells me that she is having pain radiated to be a 7 out of 10 which can be as high as 10 out of 10 with palpation and manipulation of the wound. This wound appears to be mainly venous in nature due to the bilateral lower extremity venous  stasis/lymphedema. This is definitely much worse on her left than the right side. She does  have type 1 diabetes mellitus, hypertension, morbid obesity, and is wheelchair dependent.during the course of the hospital stay a blood culture was also obtained and fortunately appeared negative. She also had an x-ray of the tibia/fibula on the left which showed no acute bone abnormality. Her white blood cell count which was performed last on 03/25/16 was 7.3, hemoglobin 12.8, protein 7.1, albumin 3.0. Her urine culture appeared to be negative for any specific organisms. Patient did have a left lower extremity venous duplex evaluation for DVT . This did not include venous reflux studies but fortunately was negative for DVT Patient also had arterial studies performed which revealed that she had a . normal ABI on the right though this was unable to be performed on the left secondary to pain that she was having around the ankle region due to the wound. However it was stated on report that she had biphasic pulses and apparently good blood flow. ========== 06/03/17 she is here in follow-up evaluation for right lower extremity ulcer. The right lower sure he has healed but she has reopened to the left medial malleolus and dorsal foot with weeping. She is waiting for new compression garments to arrive from home health, the previous compression garments were ill fitting. We will continue with compression bilaterally and follow-up in 2 weeks Readmission: 08/05/17 on evaluation today patient appears to be doing somewhat poorly in regard to her left lower extremity especially although the right lower extremity has a small area which may no longer be open. She has been having a lot of drainage from the left lower extremity however he tells me that she has not been able to use the EXTREMIT-EASE Compression at this point. She states that she did better and was able to actually apply the Juxta-Lite compression although  the wound that she has is too large and therefore really does not compress which is why she cannot wear it at this point. She has no one who can help her put it on regular basis her son can sometimes but he's not able to do it most of the time. I do believe that's why she has begun to weave and have issues as she is currently yet again. No fevers, chills, nausea, or vomiting noted at this time. Patient is no evidence of dementia. 08/12/17 on evaluation today patient appears to still be doing fairly well in regard to the draining areas/weeping areas at this point. With that being said she unfortunately did go to the ER yesterday due to what was felt to be possibly a cellulitis. They place her on doxycycline by mouth and discharge her home. She definitely was not admitted. With that being said she states she has had more discomfort which has been unusual for her even compared to prior times and she's had infections.08/12/17 on evaluation today patient appears to still be doing fairly well in regard to the draining areas/weeping areas at this point. With that being said she unfortunately did go to the ER yesterday due to what was felt to be possibly a cellulitis. They place her on doxycycline by mouth and discharge her home. She definitely was not admitted. With that being said she states she has had more discomfort which has been unusual for her even compared to prior times and she's had infections. 08/19/17 put evaluation today patient tells me that she's been having a lot of what sounds to be neuropathic type pain in regard to her left lower extremity. She has been using over-the-counter topical bins  again which some believe. That in order to apply the she actually remove the wrap we put on her last Wednesday on Thursday. Subsequently she has not had anything on compression wise since that time. The good news is a lot of the weeping areas appear to have closed at this point again I believe she would do  better with compression but we are struggling to get her to actually use what she needs to at this point. No fevers, chills, nausea, or vomiting noted at this time. 09/03/17 on evaluation today patient appears to be doing okay in regard to her lower extremities in regard to the lymphedema and weeping. Fortunately she does not seem to show any signs of infection at this point she does have a little bit of weeping occurring in the right medial malleolus area. With that being said this does not appear to be too significant which is good news. 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 She has severe skin damage secondary to both of these features involving the dorsal left foot and medial left ankle and lower leg. Still has open areas in the left anterior foot. The area on the right closed over. She uses her own juxta light stockings. She does not have an arterial issue 09/16/17 on evaluation today patient actually appears to be doing excellent in regard to her bilateral lower extremity swelling. The Juxta-Lite compression wrap seem to be doing very well for her. She has not however been using the portion that goes over her foot. Her left foot still is draining a little bit not nearly as significant as it has been in the past but still I do believe that she likely needs to utilize the full wrap including the foot portion of this will improve as well. She also has been apparently putting on a significant amount of Vaseline which also think is not helpful for her. I recommended that if she feels she needs something for moisturizer Eucerin will probably be better. 09/30/17 on evaluation today patient presents with several new open areas in regard to her left lower extremity although these appear to be minimal and mainly seem to be more moisture breakdown than anything. Fortunately she does not seem to have any evidence of infection which is  great news. She has been tolerating the dressing changes without complication we are using silver alginate on the foot she has been using AB pads to have the legs and using her Juxta- Lite compression which seems to be controlling her swelling very well. Overall I'm pleased with the poor way she has progressed. 10/14/17 on evaluation today patient appears to be doing better in regard to her left lower extremity areas of weeping. She does still have some discomfort although in general this does not appear to be as macerated and I think it is progressing nicely. I do think she still needs to wear the foot portion of her Juxta- Lite in order to get the most benefit from the wrap obviously. She states she understands. Fortunately there does not appear to be evidence of infection at this time which is great news. 10/28/17 on evaluation today patient appears to be doing excellent in regard to her left lower extremity. She has just a couple areas that are still open and seem to be causing any trouble whatsoever. For that reason I think that she is definitely headed in the right direction the spots are very tiny compared to what we  have been dealing with in the past. 11/11/17 on evaluation today patient appears to have a right lateral lower extremity ulcer that has opened since I last saw her. She states this is where the home health nurse that was coming out remove the dressing without wetting the alginate first. Nonetheless I do not know if this is indeed the case or not but more importantly we have not ordered home help to be coming out for her wounds at all. I'm unsure as to why they are coming out and we're gonna have to check on this and get things situated in that regard. With that being said we currently really do not need them to be coming out as the patient has been taking care of her leg herself without complication and no issues. In fact she was doing much better prior to nursing coming out. 11/25/17  on evaluation today patient actually appears to be doing fairly well in regard to her left lower extremity swelling. In fact she has very little area of weeping at this point there's just a small spot on the lateral portion of her right leg that still has me just a little bit more concerned as far as wanting to see this clear up before I discharge her to caring for this at home. Nonetheless overall she has made excellent progress. 12/09/17 on evaluation today patient appears to be doing rather well in regard to her lower extremity edema. She does have some weeping still in the left lower extremity although the big area we were taking care of two weeks ago actually has closed and she has another area of weeping on the left lower extremity immediately as well is the top of her foot. She does not currently have lymphedema pumps she has been wearing her compression daily on a regular basis as directed. With that being said I think she may benefit from lymphedema pumps. 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 We had a very difficult time getting and keeping this under control. 12/23/17 on evaluation today patient actually appears to be doing a little bit more poorly in regard to her bilateral lower extremities. She has been tolerating the Juxta-Lite compression wraps. Unfortunately she has two new ulcers on the right lower extremity and left lower Trinity ulceration seems to be larger. Obviously this is not good news. She has been tolerating the dressings without complication. 12/30/17 on evaluation today patient actually appears to be doing much better in regard to her bilateral lower extremity edema. She continues to have some issues with ulcerations and in fact there appears to be one spot on each leg where the wrap may have caused a little bit of a  blister which is subsequently opened up at this point is given her pain. Fortunately it does not appear to be any evidence of infection which is good news. No fevers chills noted. 01/13/18 on evaluation today patient appears to be doing rather well in regard to her bilateral lower extremities. The dressings did get kind of stuck as far as the wound beds are concerned but again I think this is mainly due to the fact that she actually seems to be showing signs of healing which is good news. She's not having as much drainage therefore she was having more of the dressing sticking. Nonetheless overall I feel like her  swelling is dramatically down compared to previous. 01/20/18 on evaluation today patient unfortunately though she's doing better in most regards has a large blister on the left anterior lower extremity where she is draining quite significantly. Subsequently this is going to need debridement today in order to see what's underneath and ensure she does not continue to trapping fluid at this location. Nonetheless No fevers, chills, nausea, or vomiting noted at this time. 01/27/18 on evaluation today patient appears to be doing rather well at this point in regard to her right lower extremity there's just a very small area that she still has open at this point. With that being said I do believe that she is tolerating the compression wraps very well in making good progress. Home health is coming out at this point to see her. Her left lower extremity on the lateral portion is actually what still mainly open and causing her some discomfort for the most part 02/10/18 on evaluation today patient actually appears to be doing very well in regard to her right lower extremity were all the ulcers appear to be completely close. In regard to the left lower extremity she does have two areas still open and some leaking from the dorsal surface of her foot but this still seems to be doing much better to me in  general. 02/24/18 on evaluation today patient actually appears to be doing much better in regard to her right lower extremity this is still completely healed. Her left lower extremity is also doing much better fortunately she has no evidence of infection. The one area that is gonna require some debridement is still on the left anterior shin. Fortunately this is not hurting her as badly today. 03/10/18 on evaluation today patient appears to be doing better in some regards although she has a little bit more open area on the dorsal foot and she also has some issues on the medial portion of the left lower extremity which is actually new and somewhat deep. With that being said there fortunately does not appear to be any significant signs of infection which is good news. No fevers, chills, nausea, or vomiting noted at this time. In general her swelling seems to be doing fairly well which is good news. 03/31/18 on evaluation today patient presents for follow-up concerning her left lower extremity lymphedema. Unfortunately she has been doing a little bit more poorly since I last saw her in regard to the amount of weeping that she is experiencing. She's also having some increased pain in the anterior shin location. Unfortunately I do not feel like the patient is making such good progress at this point a few weeks back she was definitely doing much better. 04/07/18 on evaluation today patient actually appears to be showing some signs of improvement as far as the left lower extremity is concerned. She has been tolerating the dressing changes and it does appear that the Drawtex did better for her. With that being said unfortunately home health is stating that they cannot obtain the Drawtex going forward. Nonetheless we're gonna have to check and see what they may be able to get the alginate they were using was getting stuck in causing new areas of skin being pulled all that with and subsequently weep and calls her  to worsen overall this is the first time we've seen improvement at this time. 04/14/18 on evaluation today patient actually appears to be doing rather well at this point there does not appear to be any evidence of infection  at this time and she is actually doing excellent in regard to the weeping in fact she almost has no openings remaining even compared to just last week this is a dramatic improvement. No fevers chills noted 04/21/18 evaluation today patient actually appears to be doing very well. She in fact is has a small area on the posterior lower extremity location and she has a small area on the dorsal surface of her foot that are still open both of which are very close to closing. We're hoping this will be close shortly. She brought her Juxta-Lite wrap with her today hoping that would be able to put her in it unfortunately I don't think were quite at that point yet but we're getting closer. 04/28/18 upon evaluation today patient actually appears to be doing excellent in regard to her left lower extremity ulcer. In fact the region on the posterior lower extremity actually is much smaller than previously noted. Overall I'm very happy with the progress she has made. She again did bring her Juxta-Lite although we're not quite ready for that yet. 05/11/18 upon evaluation today patient actually appears to be doing in general fairly well in regard to her left lower Trinity. The swelling is very well controlled. With that being said she has a new area on the left anterior lower extremity as well as between the first and second toes of her left foot that was not present during the last evaluation. The region of her posterior left lower extremity actually appears to be almost completely healed. T be honest I'm very pleased with o the way that stands. Nonetheless I do believe that the lotion may be keeping the area to moist as far as her legs are concerned subsequently I'm gonna consider discontinuing  that today. 05/26/18 on evaluation today patient appears to be doing rather well in regard to her left lower should be ulcers. In fact everything appears to be close except for a very small area on the left posterior lower extremity. Fortunately there does not appear to be any evidence of infection at this time. Overall very pleased with her progress. 06/02/18 and evaluation today patient actually appears to be doing very well in regard to her lower extremity ulcers. She has one small area that still continues to weep that I think may benefit her being able to justify lotion and user Juxta-Lite wraps versus continued to wrap her. Nonetheless I think this is something we can definitely look into at this point. 06/23/18 on evaluation today patient unfortunately has openings of her bilateral lower extremities. In general she seems to be doing much worse than when I last saw her just as far as her overall health standpoint is concerned. She states that her discomfort is mainly due to neuropathy she's not having any other issues otherwise. No fevers, chills, nausea, or vomiting noted at this time. 06/30/18 on evaluation today patient actually appears to be doing a little worse in regard to her right lower extremity her left lower extremity of doing fairly well. Fortunately there is no sign of infection at this time. She has been tolerating the dressing changes without complication. Home health did not come out like they were supposed to for the appropriate wrap changes. They stated that they never received the orders from [place] which were fax. Nonetheless we will send a copy of the orders with the patient today as well. 07/07/18 on evaluation today patient appears to be doing much better in regard to lower extremities. She still  has several openings bilaterally although since I last saw her her legs did show obvious signs of infection when she later saw her nurse. Subsequently a culture was obtained and she is  been placed on Bactrim and Keflex. Fortunately things seem to be looking much better it does appear she likely had an infection. Again last week we'd even discussed it but again there really was not any obvious sign that she had infection therefore we held off on the antibiotics. Nonetheless I'm glad she's doing better today. 07/14/18 on evaluation today patient appears to be doing much better regarding her bilateral lower Trinity's. In fact on the right lower for me there's nothing open at this point there are some dry skin areas at the sites where she had infection. Fortunately there is no evidence of systemic infection which is excellent news. No fevers chills noted 07/21/18 on evaluation today patient actually appears to be doing much better in regard to her left lower extremity ulcers. She is making good progress and overall I feel like she's improving each time I see her. She's having no pain I do feel like the infection is completely resolved which is excellent news. No fevers, chills, nausea, or vomiting noted at this time. 07/28/18 on evaluation today patient appears to be doing very well in regard to her left lower [employer]. Everything seems to be showing signs of improvement which is excellent news. Overall very pleased with the progress that has been made. Fortunately there's no evidence of active infection at this time also excellent news. 08/04/18 on evaluation today patient appears to be doing more poorly in regard to her bilateral lower extremities. She has two new areas open up on the right and these were completely closed as of last week. She still has the two spots on the left which in my pinion seem to be doing better. Fortunately there's no evidence of infection again at this point. 08/11/18 on evaluation today patient actually appears to be doing very well in regard to her bilateral lower Trinity wounds that all seem to be doing better and are measures smaller today.  Fortunately there's no signs of infection. No fevers, chills, nausea, or vomiting noted at this time. 08/18/18 on evaluation today patient actually appears to be doing about the same inverter bilateral lower extremities. She continues to have areas that blistering open as was drain that fortunately nothing too significant. Overall I feel like Drawtex may have done better for her however compared to the collagen. 08/25/18 on evaluation today patient appears to be doing a little bit more poorly today even compared to last time I saw her. Again I'm not exactly sure why she's making worse progress over the past several weeks. I'm beginning to wonder if there is some kind of underlying low level infection causing this issue. I did actually take a culture from the left anterior lower extremity but it was a new wound draining quite a bit at this point. Unfortunately she also seems to be having more pain which is what also makes me worried about the possibility of infection. This is despite never erythema noted at this point. 09/01/18 on evaluation today patient actually appears to be doing a little worse even compared to last week in regard to bilateral lower extremities. She did go to the hospital on the 16th was given a dose of IV Zosyn and then discharged with a recommendation to continue with the Bactrim that I previously prescribed for her. Nonetheless she is still  having a lot of discomfort she tells me as well at this time. This is definitely unfortunate. No fevers, chills, nausea, or vomiting noted at this time. 09/08/18 on evaluation today patient's bilateral lower extremities actually appear to be shown signs of improvement which is good news. Fortunately there does not appear to be any signs of active infection I think the anabiotic is helping in this regard. Overall I'm very pleased with how she is progressing. 09/15/18 patient was actually seen in ER yesterday due to her legs as well unfortunately. She  states she's been having a lot of pain and discomfort as well as a lot of drainage. Upon inspection today the patient does have a lot of swelling and drainage I feel like this is more related to lymphedema and poor fluid control than it is to infection based on what I'm seeing. The physician in the emergency department also doubted that the patient was having a significant infection nonetheless I see no evidence of infection obvious at this point although I do see evidence of poor fluid control. She still not using a compression pumps, she is not elevating due to her lift chair as well as her hospital bed being broken, and she really is not keeping her legs up as much as they should be and also has been taking off her wraps. All this combined I think has led to poor fluid control and to be honest she may be somewhat volume overloaded in general as well. I recommend that she may need to contact your physician to see if a prescription for a diuretic would be beneficial in their opinion. As long as this is safe I think it would likely help her. 09/29/18 on evaluation today patient's left lower extremity actually appears to be doing quite a bit better. At least compared to last time that I saw her. She still has a large area where she is draining from but there's a lot of new skin speckled trout and in fact there's more new skin that there are open areas of weeping and drainage at this point. This is good news. With regard to the right lower extremity this is doing much better with the only open area that I really see being a dry spot on the right lateral ankle currently. Fortunately there's no signs of active infection at this time which is good news. No fevers, chills, nausea, or vomiting noted at this time. The patient seems somewhat stressed and overwhelmed during the visit today she was very lethargic as such. She does and she is not taking any pain medications at this point. Apparently according to her  husband are also in the process of moving which is probably taking its toll on her as well. 10/06/18 on evaluation today patient appears to be doing rather well in regard to her lower extremities compared to last evaluation. Fortunately there's no signs of active infection. She tells me she did have an appointment with her primary. Nonetheless he was concerned that the wounds were somewhat deep based on pictures but we never actually saw her legs. She states that he had her somewhat worried due to the fact that she was fearing now that she was [place] have to have an amputation. With that being said based on what I'm seeing check she looks better this week that she has the last two times I've seen her with much less drainage I'm actually pleased in this regard. That doesn't mean that she's out of the water but  again I do not think what the point of talking about education at all in regard to her leg. She is very happy to hear this. She is also not having as much pain as she was having last week. 10/13/18 unfortunately on evaluation today patient still continues to have a significant amount of drainage she's not letting home health actually apply the compression dressings at this point. She's trying to use of Juxta-Lite of the top of Kerlex and the second layer of the three layer compression wrap. With that being said she just does not seem to be making as good a progress as I would expect if she was having the compression applied and in place on a regular basis. No fevers, chills, nausea, or vomiting noted at this time. 10/20/18 on evaluation today patient appears to be doing a little better in regard to her bilateral lower extremity ulcers. In fact the right lower extremity seems to be healed she doesn't even have any openings at this point left lower extremity though still somewhat macerated seems to be showing signs of new skin growth at multiple locations throughout. Fortunately there's no evidence of  active infection at this time. No fevers, chills, nausea, or vomiting noted at this time. 10/27/18 on evaluation today patient appears to be doing much better in regard to her left lower Trinity ulcer. She's been tolerating the laptop complication and has minimal drainage noted at this point. Fortunately there's no signs of active infection at this time. No fevers, chills, nausea, or vomiting noted at this time. 11/03/18 on evaluation today patient actually appears to be doing excellent in regard to her left lower extremity. She is having very little drainage at this point there does not appear to be any significant signs of infection overall very pleased with how things have gone. She is likewise extremely pleased still and seems to be making wonderful progress week to week. I do believe antibiotics were helpful for her. Her primary care provider did place on amateur clean since I last saw her. 11/17/18 on evaluation today patient appears to be doing worse in regard to her bilateral lower extremities at this point. She is been tolerating the dressing changes without complication. With that being said she typically takes the Coban off fairly quickly upon arriving home even after being seen here in the clinic and does not allow home health reapply command as part of the dressing at home. Therefore she said no compression essentially since I last saw her as best I can tell. With that being said I think it shows and how much swelling she has in the open wounds that are noted at this point. Fortunately there's no signs of infection but unfortunately if she doesn't get this under control I think she will end up with infection and more significant issues. 11/24/18 on evaluation today patient actually appears to be doing somewhat better in regard to her bilateral lower extremities. 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 Nonetheless that is doing better currently so she should be able to attempt reinitiating the lymphedema pumps at this time. No fevers, chills, nausea, or vomiting noted at this time. 12/01/18 upon evaluation today patient's left lower extremity appears to be doing quite well unfortunately her right lower extremity is not doing nearly as well. She has  been tolerating the dressing changes without complication unfortunately she did not keep a wrap on the right at this time. Nonetheless I believe this has led to increased swelling and weeping in the world is actually much larger than during the last evaluation with her. 12/08/18 on evaluation today patient appears to be doing about the same at this point in regard to her right lower extremity. There is some more palatable to touch I'm concerned about the possibility of there being some infection although I think the main issue is she's not keeping her compression wrap on which in turn is not allowing this area to heal appropriately. 12/22/18 on evaluation today patient appears to be doing better in regard to left lower extremity unfortunately significantly worse in regard to the right lower extremity. The areas of blistering and necrotic superficial tissue have spread and again this does not really appear to be signs of infection and all she just doesn't seem to be doing nearly as well is what she has been in the past. Overall I feel like the Augmentin did absolutely nothing for her she doesn't seem to have any infection again I really didn't think so last time either is more of a potential preventative measure and hoping that this would make some difference but I think the main issue is she's not wearing her compression. She tells me she cannot wear the [place] been we put on she takes it off pretty much upon getting home. Subsequently she worshiped Juxta-Lite when I questioned her about how often she  wears it this is no more than three hours a day obviously that leaves 21 hours that she has no compression and this is obviously not doing well for her. Overall I'm concerned that if things continue to worsen she is at great risk of both infection as well as losing her leg. 01/05/19 on evaluation today patient appears to be doing well in regard to her left lower extremity which he is allowing [place] to wrap and not so well with regard to her right lower extremity which she is not allowing [place] to really wrap and keep the wrap on. She states that it hurts too badly whenever it's wrapped and she ends up having to take it off. She's been using the Juxta-Lite she tells me up to six hours a day although I question whether or not that's really been the case to be honest. Previously she told me three hours today nonetheless obviously the legs as long as the wrap is doing great when she is not is doing much more poorly. 01/12/2019 on evaluation today patient actually appears to be doing a little better in my opinion with regard to her right lower extremity ulcer. She has a small open area on the left lower extremity unfortunately but again this I think is part of the normal fluctuation of what she is going to have to expect with regard to her legs especially when she is not using her lymphedema pumps on a regular basis. Subsequently based on what I am seeing today I think that she does seem to be doing slightly better with regard to her right lower extremity she did see her primary care provider on Monday they felt she had an infection and placed her on 2 antibiotics. Both Cipro and clindamycin. Subsequently again she seems possibly to be doing a little bit better in regards to the right lower extremity she also tells me however she has been wearing the compression wrap over the  past week since I spoke with her as well that is a Kerlix and Coban wrap on the right. No fevers, chills, nausea, vomiting, or  diarrhea. 01/19/2019 on evaluation today patient appears to be doing better with regard to her bilateral lower extremities especially the right. I feel like the compression has been beneficial for her which is great news. She did get a call from her primary care provider on her way here today telling her that she did have methicillin- resistant Staphylococcus aureus and he was calling in a couple new antibiotics for her including a ointment to be applied she tells me 3 times a day. With that being said this sounds like likely to be Bactroban which I think could be applied with each dressing/wrap change but I would not be able to accommodate her applying this 3 times a day. She is in agreement with the least doing this we will add that to her orders today. 01/26/2019 on evaluation today patient actually appears to be doing much better with regard to her right lower extremity. Her left lower extremity is also doing quite well all things considering. Fortunately there is no evidence of active infection at this time. No fevers, chills, nausea, vomiting, or diarrhea. 02/02/2019 on evaluation today patient appears to be doing much better compared to her last evaluation. Little by little off like her right leg is returning more towards normal. There does not appear to be any signs of active infection and overall she seems to be doing quite well which is great news. I am very pleased in this regard. No fevers, chills, nausea, vomiting, or diarrhea. 02/09/2019 upon evaluation today patient appears to be doing better with regard to her bilateral lower extremities. She has been tolerating the dressing changes without complication. Fortunately there is no signs of active infection at this time. No fevers, chills, nausea, vomiting, or diarrhea. 02/23/2019 on evaluation today patient actually appears to be doing quite well with regard to her bilateral lower extremities. She has been tolerating the dressing changes without  complication. She is even used her pumps one time and states that she really felt like it felt good. With that being said she seems to be in good spirits and her legs appear to be doing excellent. 03/09/2019 on evaluation today patient appears to be doing well with regard to her right lower extremity there are no open wounds at this time she is having some discomfort but I feel like this is more neuropathy than anything. With regard to her left lower extremity she had several areas scattered around that she does have some weeping and drainage from but again overall she does not appear to be having any significant issues and no evidence of infection at this time which is good news. 03/23/2019 on evaluation today patient appears to be doing well with regard to her right lower extremity which she tells me is still close she is using her juxta light here. Her left lower extremity she mainly just has an area on the foot which is still slightly draining although this also is doing great. Overall very pleased at this time. 04/06/2019 patient appears to be doing a little bit worse in regard to her left lower extremity upon evaluation today. She feels like this could be becoming infected again which she had issues with previous. Fortunately there is no signs of systemic infection but again this is always a struggle with her with her legs she will go from doing well to not  so well in a very short amount of time. 04/20/2019 on evaluation today patient actually appears to be doing quite well with regard to her right lower extremity I do not see any signs of active infection at this time. Fortunately there is no fever chills noted. She is still taking the antibiotics which I prescribed for her at this point. In regard to the left lower extremity I do feel like some of these areas are better although again she still is having weeping from several locations at this time. 04/27/2019 on evaluation today patient appears  to be doing about the same if not slightly worse in regard to her left lower extremity ulcers. She tells me when questioned that she has been sleeping in her Hoveround chair in fact she tells me she falls asleep without even knowing it. I think she is spending a whole lot of time in the chair and less time walking and moving around which is not good for her legs either. On top of that she is in a seated position which is also the worst position she is not really elevating her legs and she is also not using her lymphedema pumps. All this is good to contribute to worsening of her condition in general. 05/18/2019 on evaluation today patient appears to be doing well with regard to her lower extremity on the right in fact this is showing no signs of any open wounds at this time. On the left she is continuing to have issues with areas that do drain. Some of the regions have healed and there are couple areas that have reopened. She did go to the ER per the patient according to recommendations from the home health nurse due to what she was seen when she came out on 05/13/2019. Subsequently she felt like the patient needed to go to the hospital due to the fact that again she was having "milky white discharge" from her leg. Nonetheless she had and then was placed on doxycycline and subsequently seems to be doing better. 06/01/2019 upon evaluation today patient appears to be doing really in my opinion about the same. I do not see any signs of active infection which is good news. Overall she still has wounds over the bilateral lower extremities she has reopened on the right but this appears to be more of a crack where there is weeping/edema coming from the region. I do not see any evidence of infection at either site based on what I visualized today. 07/13/2019 upon evaluation today patient appears to be doing a little worse compared to last time I saw her. She since has been in the hospital from 06/21/2019 through  06/29/2019. This was secondary to having Covid. During that time they did apply lotion to her legs which unfortunately has caused her to develop a myriad of open wounds on her lower extremities. Her legs do appear to be doing better as far as the overall appearance is concerned but nonetheless she does have more open and weeping areas. 07/27/2019 upon evaluation today patient appears to be doing more poorly to be honest in regard to her left lower extremity in particular. There is no signs of systemic infection although I do believe she may have local infection. She notes she has been having a lot of blue/green drainage which is consistent potentially with Pseudomonas. That may be something that we need to consider here as well. The doxycycline does not seem to have been helping. 08/03/2019 upon evaluation today patient appears to  be doing a little better in my opinion compared to last week's evaluation. Her culture I did review today and she is on appropriate medications to help treat the Enterobacter that was noted. Overall I feel like that is good news. With that being said she is unfortunately continuing to have a lot of drainage and though it is doing better I still think she has a long ways to go to get things dried up in general. Fortunately there is no signs of systemic infection. 08/10/2019 upon evaluation today patient appears to be doing may be slightly better in regard to her left lower extremity the right lower extremity is doing much better. Fortunately there is no signs of infection right now which is good news. No fevers, chills, nausea, vomiting, or diarrhea. 08/24/2019 on evaluation today patient appears to be doing slightly better in regard to her lower extremities. The left lower extremity seems to be healed the right lower extremity is doing better though not completely healed as far as the openings are concerned. She has some generalized issues here with edema and weeping secondary to  her lymphedema though again I do believe this is little bit drier compared to prior weeks evaluations. In general I am very pleased with how things seem to be progressing. No fevers, chills, nausea, vomiting, or diarrhea. 08/31/2019 upon evaluation today patient actually seems to making some progress here with regard to the left lower extremity in particular. She has been tolerating the dressing changes without complication. Fortunately there is no signs of active infection at this time. No fevers, chills, nausea, vomiting, or diarrhea. She did see Dr. Doren Custard and he did note that she did have a issue with the left great saphenous vein and the small saphenous vein in the leg. With that being said he was concerned about the possibility of laser ablation not being extremely successful. He also mentioned a small risk of DVT associated with the procedure. However if the wounds do not continue to improve he stated that that would probably be the way to go. Fortunately the patient's legs do seem to be doing much better. 09/07/2019 upon evaluation today patient appears to be doing better with regard to her lower extremities. She has been tolerating the dressing changes without complication. With that being said she is showing signs of improvement and overall very pleased. There are some areas on her leg that I think we do need to debride we discussed this last week the patient is in agreement with doing that as long as it does not hurt too badly. 09/14/2019 upon evaluation today patient appears to be doing decently well with regard to her left lower extremity. She is not having near as much weeping as she has had in the past things seem to be drying up which is good news. There is no signs of active infection at this time. 09/21/19 upon evaluation today patient appears to be doing better in regard overall to her bilateral lower extremities. She again has less open than she did previous and each week I feel like this  is getting better. Fortunately there is no signs of active infection at this time. No fevers, chills, nausea, vomiting, or diarrhea. 09/28/2019 upon evaluation today patient actually appears to be showing signs of improvement with regard to her left lower extremity. Unfortunately the right medial lower extremity around the ankle region has reopened to some degree but this appears to be minimal still which is good news. There is no signs of  active infection at this time which is also good news. 10/12/2019 upon evaluation today patient appears to be doing okay with regard to her bilateral lower extremities today. The right is a little bit worse then last evaluation 2 weeks ago. The left is actually doing a little better in my opinion. Overall there is no signs of active infection at this time that I see. Obviously that something we have to keep a close eye on she is very prone to this with the significant and multiple openings that she has over the bilateral lower extremities. 10/19/2019 upon evaluation today patient appears to be doing about the best that I have seen her in quite some time. She has been tolerating the dressing changes without complication. There does not appear to be any signs of active infection and overall I am extremely happy with the way her legs appeared. She is drying up quite nicely and overall is having less pain. 11/09/2019 upon evaluation today patient appears to be doing better in regard to her wounds. She seems to be drying up more and more each time I see her this is just taking a very long time. Fortunately there is no signs of active infection at this time. 11/23/2019 upon evaluation today patient actually appears to be doing excellent in regard to her lower extremities at this point compared to where she has been. Fortunately there is no signs of active infection at this time. She did go to the hospital last week for nausea and vomiting completely unrelated to her  wounds. Fortunately she is doing better she was given some Reglan and got better. She had associated abdominal pain but they never found out what was going on. 12/07/2019 upon evaluation today patient appears to be doing well for the most part in regard to her legs. She unfortunately has not been keeping the [place] portion of her wraps on therefore the compression has not really been sufficient for what it is supposed to be. Nonetheless she tells me that it just hurt too bad therefore she removed it. 12/21/2019 upon evaluation today patient actually appears to be doing quite well with regard to her legs. I do feel like she has been making progress which is great news and overall there is no signs of active infection at this time. No fevers, chills, nausea, vomiting, or diarrhea. 01/04/2020 upon evaluation today patient presents for follow-up concerning her lower extremity edema bilaterally. She still has open wounds she has not been using her lymphedema pumps. She is also not been utilizing her compression wraps appropriately she tends to unwrap them, take them off, or states that they hurt. Obviously the reason they hurt is because her legs start to swell but the issue is if she would use her compression/lymphedema pumps regularly she would not swell and she would have the pain. Nonetheless she has not even picked them up once honestly over the past several months and may be even as much as in the past year based on my opinion and what have seen. She tells me today that after last week when I talked about this with her specifically actually that was 2 weeks ago that she "forgot". 8//21 on evaluation today patient appears to be doing a little better in regard to her legs bilaterally. Fortunately there is no signs of active infection at this time. She tells me that she used her lymphedema pumps all of one time over the past 2 weeks since I last saw her. She tells me  that she has been too busy in order to  continue to use these. 02/01/2020 on evaluation today patient appears to be doing some better in regard to her wounds in general in her legs. We felt the right was healed although is not completely it does appear to be doing better she tells me she has been using her lymphedema pumps that she has had this six times since I last saw her. Obviously the more she does that the better she would do my opinion 02/15/2020 upon evaluation today patient appears to be doing about the same in regard to her legs. She tells me that she is pumping I'm still not sure how much she does to be perfectly honest. However even if she does a little bit here and there I guess that is better than nothing. Fortunately there is no sign of active infection at this time which is great news. No fevers, chills, nausea, vomiting, or diarrhea. 02/29/2020 on evaluation today patient actually appears to be doing quite well all things considered this week. She has been tolerating the dressing changes without complication. Fortunately there is no signs of active infection at this time. No fevers, chills, nausea, vomiting, or diarrhea. 03/14/2020 upon evaluation today patient appears to be doing really about the same in regard to her legs. There is no signs of improvement overall and she as noted from home health does not appear to be elevating her legs he can get into her lift chair. There is too much stuff piled up on it the patient tells me. She also tells me she cannot really use her pumps effectively due to the fact that she cannot have any space to get them on. Finally she is also not really elevating her legs because she is not sleeping in her bed she is sleeping in her chair currently and again overall I think everything that she is done in combination has been exactly the wrong thing for what she needs for her legs. 04/04/2020 upon evaluation today patient appears to be doing well at this time with regard to her legs. She is actually  been pumping, keeping her wraps on, and to be honest she seems to be doing dramatically better the right leg is excellent the left leg is also excellent and measuring much smaller than previous. 04/18/2020 upon evaluation today patient actually is continue to make good progress in regard to her lower extremities bilaterally. Everything is improving and less wet that has been in the past overall I am extremely pleased with where things stand and I think that she is making great progress. The patient tells me she still continue to use her compression pumps 05/02/2020 on evaluation today patient appears to be doing well at this time in regard to her left leg which is showing signs of drying up. With that being said she does have a lot of lymphedema type crusty skin around the toes of her left foot and the right medial ankle which has opened at this point. Fortunately there is no signs of active infection systemically at this point or even locally for that matter. 05/23/2020 on evaluation today patient appears to be doing well with regard to her lower extremities. Fortunately there is no signs of active infection at this time. No fever chills noted. She has been very depressed however she tells me. 06/06/2020 patient came in today for evaluation in regard to her bilateral lower extremity ulcerations. With that being said she came in feeling okay and actually  laughing and joking around with the staff checking her in. Subsequently however she had a coughing spell and following the coughing spell it was a dramatic conversion from being jovial and joking around to being extremely short of breath her vital signs actually dropped in regard to her blood pressure from around 175 to down around 637 for systolic and from around 96 diastolic down to around 70. With that being said she also accompanied this with an increase in her respiratory rate which was also quite significant. Nonetheless I actually upon going into  see her was extremely worried we called EMS to have her transported to the ER for further evaluation and treatment. She continued until EMS got here to be extremely short of breath even on 6 L of oxygen. Her oxygen saturation did come up to 99% but overall it was only 95 before which was not terrible she did feel like the oxygen helped her feel somewhat better however. She has a history of asthma but again even the coughing spell really should not have done this degree of alteration in her demeanor from where she was just before to after the coughing spell. She tells [place] however she has been feeling somewhat abnormal since Friday although she could not really pinpoint exactly what was going on. 06/27/2020 plan evaluation today patient appears to be doing okay in regard to her leg ulcers. She is really not showing a lot of improvement to be honest and she still has a lot of weeping. With that being said after I last saw her we called EMS to take her to the hospital she actually did not go she went to see her primary care provider who according to the patient have not identified and read through the note states that everything checked out okay. Nonetheless the patient has continued to have bouts where she gets very short of breath for seemingly no reason whatsoever. That happened even the same night that I saw her last time. This was after she refused transport to the hospital. With that being said she also tells me that just getting dressed to come here is quite a chore which is putting on her shirt causing her to have to stop to catch her breath. Obviously this is not normal and I feel like there is something going on. She may need a referral to be seen by cardiology ASAP. She does see her primary care provider early next week she tells me Objective Constitutional Obese and well-hydrated in no acute distress. Vitals Time Taken: 12:53 PM, Height: 62 in, Weight: 335 lbs, BMI: 61.3, Temperature: 98.1 F,  Pulse: 80 bpm, Respiratory Rate: 17 breaths/min, Blood Pressure: 149/58 mmHg, Capillary Blood Glucose: 222 mg/dl. Psychiatric this patient is able to make decisions and demonstrates good insight into disease process. Alert and Oriented x 3. pleasant and cooperative. General Notes: Upon inspection patient's wound bed actually showed signs of doing okay there were no major ulcerations on the lower extremities though she has a lot of weeping noted in general. Her respirations are unfortunately though okay right now very labored when she moves around or when she has to do any type of physical activity or exertion. Nonetheless I feel that cardiology may be a good first step to evaluate what is going on here. Integumentary (Hair, Skin) Wound #61 status is Open. Original cause of wound was Gradually Appeared. The wound is located on the Left,Circumferential Lower Leg. The wound measures 8.5cm length x 16cm width x 0.1cm depth; 106.814cm^2  area and 10.681cm^3 volume. There is Fat Layer (Subcutaneous Tissue) exposed. There is no tunneling or undermining noted. There is a medium amount of serous drainage noted. The wound margin is flat and intact. There is large (67-100%) pink, pale granulation within the wound bed. There is no necrotic tissue within the wound bed. Wound #64 status is Open. Original cause of wound was Gradually Appeared. The wound is located on the Left,Dorsal Foot. The wound measures 5cm length x 6cm width x 0.1cm depth; 23.562cm^2 area and 2.356cm^3 volume. The wound is limited to skin breakdown. There is no tunneling or undermining noted. There is a medium amount of serosanguineous drainage noted. The wound margin is flat and intact. There is large (67-100%) pink, pale granulation within the wound bed. There is no necrotic tissue within the wound bed. Wound #67 status is Open. Original cause of wound was Gradually Appeared. The wound is located on the Right,Medial Lower Leg. The wound  measures 5.5cm length x 5.5cm width x 0.1cm depth; 23.758cm^2 area and 2.376cm^3 volume. There is Fat Layer (Subcutaneous Tissue) exposed. There is no tunneling or undermining noted. There is a medium amount of serous drainage noted. The wound margin is thickened. There is large (67-100%) red granulation within the wound bed. There is no necrotic tissue within the wound bed. Wound #68 status is Open. Original cause of wound was Gradually Appeared. The wound is located on the Left,Anterior Lower Leg. The wound measures 2cm length x 3cm width x 0.1cm depth; 4.712cm^2 area and 0.471cm^3 volume. There is no tunneling or undermining noted. There is a medium amount of serosanguineous drainage noted. The wound margin is distinct with the outline attached to the wound base. There is medium (34-66%) red, pink granulation within the wound bed. There is a medium (34-66%) amount of necrotic tissue within the wound bed including Adherent Slough. 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 There was a Three Layer Compression Therapy Procedure by Rhae Hammock, RN. Post procedure Diagnosis Wound #61: Same as Pre-Procedure Wound #67 Pre-procedure diagnosis of  Wound #67 is a Diabetic Wound/Ulcer of the Lower Extremity located on the Right,Medial Lower Leg . There was a Three Layer Compression Therapy Procedure by Rhae Hammock, RN. Post procedure Diagnosis Wound #67: Same as Pre-Procedure Plan Follow-up Appointments: Return Appointment in 2 weeks. Bathing/ Shower/ Hygiene: May shower and wash wound with soap and water. - with dressing changes, wash both legs with wash cloth and soap and water with dressing changes Edema Control - Lymphedema / SCD / Other: Lymphedema Pumps. Use Lymphedema pumps on leg(s) 2-3 times a day for 45-60 minutes. If wearing any wraps or hose, do not remove them. Continue exercising as instructed. Elevate legs to the level of the heart or above for 30 minutes daily and/or when sitting, a frequency of: Avoid standing for long periods of time. Exercise regularly Additional  Orders / Instructions: Other: - Follow up with primary care MD for shortness of breath Home Health: No change in wound care orders this week; continue Home Health for wound care. May utilize formulary equivalent dressing for wound treatment orders unless otherwise specified. Other Home Health Orders/Instructions: - [facility] WOUND #61: - Lower Leg Wound Laterality: Left, Circumferential Peri-Wound Care: Zinc Oxide Ointment 30g tube 2 x Per Week/30 Days Discharge Instructions: Apply Zinc Oxiide to weeping areas with each dressing change Peri-Wound Care: Sween Lotion (Moisturizing lotion) (Home Health) 2 x Per Week/30 Days Discharge Instructions: Apply moisturizing lotion as directed Prim Dressing: KerraCel Ag Gelling Fiber Dressing, 4x5 in (silver alginate) (Home Health) 2 x Per Week/30 Days ary Discharge Instructions: Apply silver alginate to wound bed as instructed Secondary Dressing: Woven Gauze Sponge, Non-Sterile 4x4 in (Home Health) 2 x Per Week/30 Days Discharge Instructions: Apply over primary dressing as directed. Secondary Dressing: ABD  Pad, 5x9 (Home Health) 2 x Per Week/30 Days Discharge Instructions: Apply over primary dressing as directed. Com pression Wrap: ThreePress (3 layer compression wrap) (Home Health) 2 x Per Week/30 Days Discharge Instructions: Apply three layer compression as directed. Pad bend of ankle with foam or ABD pad. WOUND #64: - Foot Wound Laterality: Dorsal, Left Peri-Wound Care: Zinc Oxide Ointment 30g tube 2 x Per Week/30 Days Discharge Instructions: Apply Zinc Oxiide to weeping areas with each dressing change Peri-Wound Care: Sween Lotion (Moisturizing lotion) (Home Health) 2 x Per Week/30 Days Discharge Instructions: Apply moisturizing lotion as directed Prim Dressing: KerraCel Ag Gelling Fiber Dressing, 4x5 in (silver alginate) (Home Health) 2 x Per Week/30 Days ary Discharge Instructions: Apply silver alginate to wound bed as instructed Secondary Dressing: Woven Gauze Sponge, Non-Sterile 4x4 in (Home Health) 2 x Per Week/30 Days Discharge Instructions: Apply over primary dressing as directed. Secondary Dressing: ABD Pad, 5x9 (Home Health) 2 x Per Week/30 Days Discharge Instructions: Apply over primary dressing as directed. Com pression Wrap: ThreePress (3 layer compression wrap) (Home Health) 2 x Per Week/30 Days Discharge Instructions: Apply three layer compression as directed. Pad bend of ankle with foam or ABD pad. WOUND #67: - Lower Leg Wound Laterality: Right, Medial Peri-Wound Care: Zinc Oxide Ointment 30g tube 2 x Per Week/30 Days Discharge Instructions: Apply Zinc Oxiide to weeping areas with each dressing change Peri-Wound Care: Sween Lotion (Moisturizing lotion) (Home Health) 2 x Per Week/30 Days Discharge Instructions: Apply moisturizing lotion as directed Prim Dressing: KerraCel Ag Gelling Fiber Dressing, 4x5 in (silver alginate) (Home Health) 2 x Per Week/30 Days ary Discharge Instructions: Apply silver alginate to wound bed as instructed Secondary Dressing: Woven Gauze Sponge,  Non-Sterile 4x4 in (Home Health) 2 x Per Week/30 Days Discharge Instructions: Apply over primary dressing as directed. Secondary Dressing: ABD Pad, 5x9 (Home Health) 2 x Per Week/30 Days Discharge Instructions: Apply over primary dressing as directed. Com pression Wrap: ThreePress (3 layer compression wrap) (Home Health) 2 x Per Week/30 Days Discharge Instructions: Apply three layer compression as directed. Pad bend of ankle with foam or ABD pad. WOUND #68: - Lower Leg Wound Laterality: Left, Anterior Peri-Wound Care: Zinc Oxide Ointment 30g tube 2 x Per Week/30 Days Discharge Instructions: Apply Zinc Oxiide to weeping areas with each dressing change Peri-Wound Care: Sween Lotion (Moisturizing lotion) (Home Health) 2 x Per Week/30 Days Discharge Instructions: Apply moisturizing lotion as directed Prim Dressing: KerraCel Ag Gelling Fiber Dressing, 4x5 in (silver alginate) (Home Health) 2 x Per Week/30 Days ary Discharge Instructions: Apply silver alginate to wound  bed as instructed Secondary Dressing: Woven Gauze Sponge, Non-Sterile 4x4 in (Home Health) 2 x Per Week/30 Days Discharge Instructions: Apply over primary dressing as directed. Secondary Dressing: ABD Pad, 5x9 (Home Health) 2 x Per Week/30 Days Discharge Instructions: Apply over primary dressing as directed. Com pression Wrap: ThreePress (3 layer compression wrap) (Home Health) 2 x Per Week/30 Days Discharge Instructions: Apply three layer compression as directed. Pad bend of ankle with foam or ABD pad. 1. I would recommend currently based on issues she has been having with her breathing even doing little things like putting her shirt on to come in this morning she gets extremely short of breath. For that reason I feel like she may need to see a cardiologist ASAP. I discussed this with the patient she is going to discuss this with her primary care provider at the beginning of the week when she sees her. 2. I am also can recommend at  this point that the patient go ahead and initiate treatment with a continuation of the silver alginate followed by the compression wraps. She should keep these in place Home health is coming out to change the meds well. The big key here is that we want to make sure that we keep her edema under control. T that end we will utilize in a 3 layer compression wrap. o 3. She should be using her lymphedema pumps as well as elevating her legs however her pumps are packed up she is not even attempting to use those at the moment I am not really sure how much she is elevating she tells me she is going through a bunch of junk in her house that has been brought from a storage unit also the reason why they had to pack the pumps up. We will see patient back for reevaluation in 2 weeks here in the clinic. If anything worsens or changes patient will contact our office for additional recommendations. Addendum: I discussed with the patient today that I really felt like she needs to be evaluated possibly even by cardiologist based on her symptoms. I am concerned about the possibility of unstable angina versus some kind of lung pathology. She is extremely short of breath just with getting up from the table into her wheelchair she almost fell and we checked her blood sugar this was at 238 and was okay. Nonetheless I am concerned that she may have something cardiac in nature going on as she seems to have significant dyspnea on exertion. I will try to send her to the ER once last time she was here she refused to go once EMS got her downstairs. She went to her primary and according to the patient her primary did not find anything out of the ordinary. I attempted to actually contact her primary care provider today I was on hold personally for 30 minutes then I have my staff actually get on hold as well and all they could get his answer machine message was left. With that being said I was not able to establish contact I believe  that the primary care is only actually seen her 1 time so again I am not sure how much information they would really have with regard to the patient either. Nonetheless I feel like she may need to be seen by cardiology ASAP I again suggested she should go to the ER for further evaluation however the patient refuses. I am very concerned about her health and wellbeing and to be honest even her life  if she does not get this taken care of. She seems to be going downhill quickly Electronic Signature(s) Signed: 06/27/2020 5:16:59 PM By: [name] PA-C Previous Signature: 06/27/2020 1:27:51 PM Version By: [name] PA-C Entered By: [name] on 06/27/2020 17:16:58 -------------------------------------------------------------------------------- SuperBill Details Patient Name: Date of Service: [name] 06/27/2020 Medical Record Number: [id number] Patient Account Number: [id number] Date of Birth/Sex: Treating RN: [id number] (69 y.o. [name] Primary Care Provider: [name] Other Clinician: Referring Provider: Treating Provider/Extender: Darlen Round in Treatment: 151 Diagnosis Coding ICD-10 Codes Code Description E11.622 Type 2 diabetes mellitus with other skin ulcer I89.0 Lymphedema, not elsewhere classified I87.331 Chronic venous hypertension (idiopathic) with ulcer and inflammation of right lower extremity I87.332 Chronic venous hypertension (idiopathic) with ulcer and inflammation of left lower extremity L97.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 leg with fat layer exposed L97.822 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 I10 Essential (primary) hypertension E66.01 Morbid (severe) obesity due to excess calories F41.8 Other specified anxiety disorders R53.1 Weakness Facility Procedures The  patient participates with Medicare or their insurance follows the Medicare Facility Guidelines: CPT4 Description Modifier Quantity Code [id number] BILATERAL: Application of multi-layer venous compression system; leg (below knee), including ankle and 1 foot. Physician Procedures : CPT4 Code Description Modifier [id number] - WC PHYS LEVEL 4 - EST PT ICD-10 Diagnosis Description E11.622 Type 2 diabetes mellitus with other skin ulcer I89.0 Lymphedema, not elsewhere classified I87.331 Chronic venous hypertension (idiopathic) with  ulcer and inflammation of right lower extremity I87.332 Chronic venous hypertension (idiopathic) with ulcer and inflammation of left lower extremity Quantity: 1 Electronic Signature(s) Signed: 06/27/2020 1:28:11 PM By: [name] PA-C Entered By: [name] on 06/27/2020 13:28:08

## 2020-06-28 ENCOUNTER — Encounter: Payer: Self-pay | Admitting: Family Medicine

## 2020-06-28 ENCOUNTER — Ambulatory Visit: Payer: Medicare PPO | Admitting: Family Medicine

## 2020-06-28 NOTE — Progress Notes (Deleted)
No chief complaint on file.    HISTORY OF PRESENT ILLNESS: Today 06/28/20  [name] is a 70 y.o. female here today for follow up for memory loss. She was seen 02/2020 for memory loss with mood concerns of irritability, lack of empathy and binge eating. Escitalopram 45m started for depression. CT was normal for age. Sleep    HISTORY (copied from previous note)  I [facility] the [name] neurolog[facility] regarding her memory loss.  She is a 70year old woman with memory loss.    She feels her memory is a little worse.  She notes she had some problems a couple years ago, then did better but is having more trouble again.   She notes that she is not following conversations as well and sometimes forgets a word.   Her husband noted she seemed worse about 4-6 months ago.   He notes she seems to get intensely angry at times with very short fuse   She is less empathetic and hurtful at times.   She is apathetic.     She is binge eating sometimes.      She is sleeping poorly at night but sleeps more during the day.   She was tested for OSA a few years ago.  She has gained a few pounds over the past 6 months but weights about the same as a couple years ago.   Her husband notes she snores but has not noted any apneic signs.     She feels depressed .   She sees wound care for her left leg (diabetic ulcers) for the past 18 months and is depressed about lack of healing.      She has not been on any anti-depressant  She is less mobile and now can only use walker a few steps.   She can transfer independently.  She has a power chair in the home as she can't self propel well due to shoulder pain.    I personally reviewed the MRI of the brain from 05/31/2016.  Brain volume is normal for age.  She has some scattered T2/FLAIR hyperintense foci in the hemispheres and right pons consistent with mild chronic microvascular ischemic  change.  There were no acute findings.  CT scan from 2018 showed normal brain volume  Montreal Cognitive Assessment  02/27/2020  Visuospatial/ Executive (0/5) 3  Naming (0/3) 3  Attention: Read list of digits (0/2) 1  Attention: Read list of letters (0/1) 1  Attention: Serial 7 subtraction starting at 100 (0/3) 1  Language: Repeat phrase (0/2) 1  Language : Fluency (0/1) 1  Abstraction (0/2) 2  Delayed Recall (0/5) 2  Orientation (0/6) 5  Total 20  Adjusted Score (based on education) 20      REVIEW OF SYSTEMS: Out of a complete 14 system review of symptoms, the patient complains only of the following symptoms, and all other reviewed systems are negative.   ALLERGIES: Allergies  Allergen Reactions  . Ace Inhibitors Swelling  . Tizanidine   . Metformin And Related Other (See Comments)    CHILLS  . Propofol Itching     HOME MEDICATIONS: Outpatient Medications Prior to Visit  Medication Sig Dispense Refill  . ACCU-CHEK FASTCLIX LANCETS MISC 1 each as needed.    .Marland KitchenACCU-CHEK SMARTVIEW test strip 1 each 4 (four) times daily. for testing  0  . acetaminophen (TYLENOL) 500 MG tablet Take 500 mg by mouth  daily as needed for moderate pain.    Marland Kitchen albuterol (PROVENTIL HFA;VENTOLIN HFA) 108 (90 Base) MCG/ACT inhaler Inhale 2 puffs into the lungs every 4 (four) hours as needed for wheezing or shortness of breath.     . allopurinol (ZYLOPRIM) 300 MG tablet Take 300 mg by mouth daily.    Marland Kitchen aspirin 325 MG tablet Take 325 mg by mouth daily.    Marland Kitchen atenolol (TENORMIN) 25 MG tablet Take 25-50 mg by mouth 2 (two) times daily. Take 50 mg every morning and then 25 mg every evening    . Blood Glucose Monitoring Suppl (ACCU-CHEK NANO SMARTVIEW) w/Device KIT 1 each See admin instructions.  0  . Cholecalciferol (VITAMIN D3) 2000 units capsule Take 2,000 Units by mouth daily.    . cyclobenzaprine (FLEXERIL) 5 MG tablet Take 5 mg by mouth 3 (three) times daily as needed.    . diclofenac sodium  (VOLTAREN) 1 % GEL Apply 4 g topically 4 (four) times daily. 1 Tube 1  . Diclofenac Sodium CR (VOLTAREN-XR) 100 MG 24 hr tablet Take 1 tablet (100 mg total) by mouth daily. 10 tablet 0  . escitalopram (LEXAPRO) 10 MG tablet Take 1 tablet (10 mg total) by mouth daily. 30 tablet 11  . furosemide (LASIX) 20 MG tablet Take 1 tablet (20 mg total) by mouth daily. 30 tablet 0  . gabapentin (NEURONTIN) 300 MG capsule Take 1 capsule (300 mg total) by mouth 2 (two) times daily. (Patient taking differently: Take 600 mg by mouth 2 (two) times daily. ) 60 capsule 2  . hydrocerin (EUCERIN) CREA Apply 1 application topically daily. 228 g 0  . HYDROcodone-acetaminophen (NORCO) 10-325 MG tablet Take 1 tablet by mouth every 6 (six) hours as needed.    . insulin degludec (TRESIBA FLEXTOUCH) 100 UNIT/ML SOPN FlexTouch Pen Inject 0.75 mLs (75 Units total) into the skin daily. (Patient taking differently: Inject 75 Units into the skin every morning. ) 3 mL 1  . NIFEdipine (PROCARDIA XL/ADALAT-CC) 90 MG 24 hr tablet Take 90 mg by mouth daily.    Marland Kitchen NOVOFINE 32G X 6 MM MISC 1 each as needed.    Marland Kitchen NOVOLOG FLEXPEN 100 UNIT/ML FlexPen Take 17 Units by mouth 2 (two) times daily.  1  . polyethylene glycol (MIRALAX / GLYCOLAX) packet Take 17 g by mouth daily as needed for moderate constipation.    . potassium chloride (K-DUR) 10 MEQ tablet Take 1 tablet (10 mEq total) by mouth daily. 30 tablet 0  . protein supplement shake (PREMIER PROTEIN) LIQD Take 325 mLs (11 oz total) by mouth daily.  0   No facility-administered medications prior to visit.     PAST MEDICAL HISTORY: Past Medical History:  Diagnosis Date  . Anxiety   . Arthritis    "back, arms, legs" (03/24/2016)  . Asthma   . Chronic lower back pain   . Colonic polyp    last colonoscopy done in 2009 with normal results per medical record  . Depressive disorder   . Gastric polyp   . Gout    has taken allopurinol 373m once daily in past  . Headache   . History  of hiatal hernia   . Hypertension   . Migraine    "none in awhile; might have a couple/year" (03/24/2016)  . Mixed hyperlipidemia    01/2016 Total chol 141, HDL 59, LDL 63, ration 1.1  . Osteoarthritis   . TIA (transient ischemic attack) 11/2014  . Type II diabetes mellitus ([place]  PAST SURGICAL HISTORY: Past Surgical History:  Procedure Laterality Date  . ARTERY BIOPSY Right 08/12/2016   Procedure: BIOPSY TEMPORAL ARTERY;  Surgeon: [name], MD;  Location: [facility];  Service: Vascular;  Laterality: Right;  BIOPSY TEMPORAL ARTERY  . BREAST BIOPSY Left ~ 2015   benign  . CARPAL TUNNEL RELEASE Bilateral   . DILATION AND CURETTAGE OF UTERUS    . KNEE ARTHROSCOPY Right 2003   in [place]  . LAPAROSCOPIC CHOLECYSTECTOMY    . TUBAL LIGATION    . VAGINAL HYSTERECTOMY  1982     FAMILY HISTORY: Family History  Problem Relation Age of Onset  . Stroke Father   . Stroke Brother   . Cancer Other   . Gout Mother   . Hypertension Mother   . Arthritis Mother   . Asthma Mother   . Stroke Brother      SOCIAL HISTORY: Social History   Socioeconomic History  . Marital status: Married    Spouse name: Not on file  . Number of children: Not on file  . Years of education: 12+  . Highest education level: Not on file  Occupational History  . Not on file  Tobacco Use  . Smoking status: Never Smoker  . Smokeless tobacco: Never Used  Vaping Use  . Vaping Use: Never used  Substance and Sexual Activity  . Alcohol use: No  . Drug use: No  . Sexual activity: Never  Other Topics Concern  . Not on file  Social History Narrative   Right handed   Caffeine use: none   Lives with husband   Recently moved from [place] Strain: Not on file  Food Insecurity: Not on file  Transportation Needs: Not on file  Physical Activity: Not on file  Stress: Not on file  Social Connections: Not on file  Intimate Partner Violence: Not  on file      PHYSICAL EXAM  There were no vitals filed for this visit. There is no height or weight on file to calculate BMI.   Generalized: Well developed, in no acute distress  Cardiology: normal rate and rhythm, no murmur auscultated  Respiratory: clear to auscultation bilaterally    Neurological examination  Mentation: Alert oriented to time, place, history taking. Follows all commands speech and language fluent Cranial nerve II-XII: Pupils were equal round reactive to light. Extraocular movements were full, visual field were full on confrontational test. Facial sensation and strength were normal. Uvula tongue midline. Head turning and shoulder shrug  were normal and symmetric. Motor: The motor testing reveals 5 over 5 strength of all 4 extremities. Good symmetric motor tone is noted throughout.  Sensory: Sensory testing is intact to soft touch on all 4 extremities. No evidence of extinction is noted.  Coordination: Cerebellar testing reveals good finger-nose-finger and heel-to-shin bilaterally.  Gait and station: Gait is normal. Tandem gait is normal. Romberg is negative. No drift is seen.  Reflexes: Deep tendon reflexes are symmetric and normal bilaterally.     DIAGNOSTIC DATA (LABS, IMAGING, TESTING) - I reviewed patient records, labs, notes, testing and imaging myself where available.  Lab Results  Component Value Date   WBC 9.9 11/18/2019   HGB 13.0 11/18/2019   HCT 41.7 11/18/2019   MCV 91.2 11/18/2019   PLT 307 11/18/2019      Component Value Date/Time   NA 138 11/18/2019 0251   K 4.1 11/18/2019 0251   CL  102 11/18/2019 0251   CO2 25 11/18/2019 0251   GLUCOSE 215 (H) 11/18/2019 0251   BUN 15 11/18/2019 0251   CREATININE 0.80 11/18/2019 0251   CREATININE 0.88 01/25/2016 0001   CALCIUM 9.2 11/18/2019 0251   PROT 8.7 (H) 11/18/2019 0251   ALBUMIN 3.5 11/18/2019 0251   AST 14 (L) 11/18/2019 0251   ALT 15 11/18/2019 0251   ALKPHOS 110 11/18/2019 0251    BILITOT 0.5 11/18/2019 0251   GFRNONAA >60 11/18/2019 0251   GFRAA >60 11/18/2019 0251   Lab Results  Component Value Date   CHOL 100 06/22/2019   HDL 39 (L) 06/22/2019   LDLCALC 48 06/22/2019   TRIG 64 06/22/2019   CHOLHDL 2.6 06/22/2019   Lab Results  Component Value Date   HGBA1C 7.7 (H) 06/22/2019   Lab Results  Component Value Date   XJDBZMCE02 233 02/27/2020   Lab Results  Component Value Date   TSH 1.790 02/27/2020    No flowsheet data found.   ASSESSMENT AND PLAN  70 y.o. year old female  has a past medical history of Anxiety, Arthritis, Asthma, Chronic lower back pain, Colonic polyp, Depressive disorder, Gastric polyp, Gout, Headache, History of hiatal hernia, Hypertension, Migraine, Mixed hyperlipidemia, Osteoarthritis, TIA (transient ischemic attack) (11/2014), and Type II diabetes mellitus ([place]). here with   No diagnosis found.    No orders of the defined types were placed in this encounter.     I spent 20 minutes of face-to-face and non-face-to-face time with patient.  This included previsit chart review, lab review, study review, order entry, electronic health record documentation, patient education.    Debbora Presto, MSN, FNP-C 06/28/2020, 8:09 AM  [facility] [address], [place],  [address] [phone]

## 2020-07-11 ENCOUNTER — Other Ambulatory Visit: Payer: Self-pay

## 2020-07-11 ENCOUNTER — Encounter (HOSPITAL_BASED_OUTPATIENT_CLINIC_OR_DEPARTMENT_OTHER): Payer: Medicare PPO | Admitting: Physician Assistant

## 2020-07-11 DIAGNOSIS — E11622 Type 2 diabetes mellitus with other skin ulcer: Secondary | ICD-10-CM | POA: Diagnosis not present

## 2020-07-11 NOTE — Progress Notes (Addendum)
[name] ([id number]) Visit Report for 07/11/2020 Chief Complaint Document Details Patient Name: Date of Service: [name], [name] 07/11/2020 11:00 A M Medical Record Number: [id number] Patient Account Number: [id number] Date of Birth/Sex: Treating RN: Jan 10, 1951 (70 y.o. [name] Primary Care Provider: [name] Other Clinician: Referring Provider: Treating Provider/Extender: Darlen Round in Treatment: [phone] Information Obtained from: Patient Chief Complaint Bilateral reoccurring LE ulcers Electronic Signature(s) Signed: 07/11/2020 11:24:42 AM By: [name] PA-C Entered By: [name] on 07/11/2020 11:24:42 -------------------------------------------------------------------------------- HPI Details Patient Name: Date of Service: [name]. 07/11/2020 11:00 A M Medical Record Number: [id number] Patient Account Number: [id number] Date of Birth/Sex: Treating RN: Jul 27, 1950 (70 y.o. [name] Primary Care Provider: [name] Other Clinician: Referring Provider: Treating Provider/Extender: Darlen Round in Treatment: 153 History of Present Illness HPI Description: this patient has been seen a couple of times before and returns with recurrent problems to her right and left lower extremity with swelling and weeping ulcerations due to not wearing her compression stockings which she had been advised to do during her last discharge, at the end of June 2018. During her last visit the patient had had normal arterial blood flow and her venous reflux study did not necessitate any surgical intervention. She was recommended compression and elevation and wound care. After prolonged treatment the patient was completely healed but she has been noncompliant with wearing or compressions.. She was here last week with an outpatient return visit planned but the patient came in a very poor general condition with altered  mental status and was rushed to the ER on my request. With a history of hypertension, diabetes, TIA and right-sided weakness she was set up for an MRI on her brain and cervical spine and was sent to [facility]. Getting an MRI done was very difficult but once the workup was done she was found not to have any spinal stenosis, epidural abscess or hematoma or discitis. This was radiculopathy to be treated as an outpatient and she was given a follow-up appointment. Today she is feeling much better alert and oriented and has come to reevaluate her bilateral lower extremity lymphedema and ulceration 03/25/2017 -- she was admitted to the hospital on 03/16/2017 and discharged on 03/18/2017 with left leg cellulitis and ulceration. She was started on vancomycin and Zosyn and x-ray showed no bony involvement. She was treated for a cellulitis with IV antibiotics changed to Rocephin and Flagyl and was discharged on oral Keflex and doxycycline to complete a 7 day course. Last hemoglobin A1c was 7.1 and her other ailments including hypertension got asthma were appropriately treated. 05/06/2017 -- she is awaiting the right size of compression stockings from [place] but other than that has been doing well. ====== Old notes 70 year old patient was seen one time last October and was lost to follow-up. She has recurrent problems with weeping and ulceration of her left lower extremity and has swelling of this for several years. It has been worse for the last 2 months. Past medical history is significant for diabetes mellitus type 2, hypertension, gout, morbid obesity, depressive disorders, hiatal hernia, migraines, status post knee surgery, risk of a cholecystectomy, vaginal hysterectomy and breast biopsy. She is not a smoker. As noted before she has never had a venous duplex study and an arterial ABI study was attempted but the left lower extremity was noncompressible 10/01/2016 -- had a lower extremity venous duplex  reflux evaluation which showed  no evidence of deep vein reflux in the right or left lower extremity, and no evidence of great saphenous vein reflux more than 500 ms in the right or left lower extremity, and the left small saphenous vein is incompetent but no vascular consult was recommended. review of her electronic medical records noted that the ABI was checked in July 2017 where the right ABI was normal limits and the left ABI could not be ascertained due to pain with cuff pressure but the waveforms are within normal limits. her arterial duplex study scheduled for April 27. 10/08/2016 -- the patient has various reasons for not having a compression on and for the last 3 days she has had no compression on her left lower extremity either due to pain or the lack of nursing help. She does not use her juxta lites either. 10/15/2016 -- the patient did not keep her appointment for arterial duplex study on April 27 and I have asked her to reschedule this. Her pain is out of proportion with the physical findings and she continuously fails to wear a compression wraps and cuts them off because she says she cannot tolerate the pain. She does not use her juxta lites either. 10/22/2016 -- he has rescheduled her arterial duplex study to May 21 and her pain today is a bit better. She has not been wearing her juxta lites on her right lower extremity but now understands that she needs to do this. She did tolerate the to press compression wrap on her left lower extremity 10/29/2016 --arterial duplex study is scheduled for next week and overall she has been tolerating her compression wraps and also using her juxta lites on her right lower extremity 11/05/2016 -- the right ABI was 0.95 the left was 1.03. The digit TBI is on the right was 0.83 on the left was 0.92 and she had biphasic flow through these vessels. The impression was that of normal lower extremity arterial study. 11/12/2016 -- her pain is minimal and she  is doing very well overall. 11/26/2016 -- she has got juxta lites and her insurance will not pay for additional dual layer compression stockings. She is going to order some from [place]. 05/12/2017 -- her juxta lites are very old and too big for her and these have not been helping with compression. She did get 20-30 mm compression stockings from [place] but she and her husband are unable to put these on. I believe she will benefit from bilateral Extremit-ease, compression stockings and we will measure her for these today. 05/20/2017 -- lymphedema on the left lower extremity has increased a lot and she has a open ulceration as a result of this. The right lower extremity is looking pretty good. She has decided to by the compression stockings herself and will get reimbursed by the home health, at a later date. 05/27/2017 -- her sciatica is bothering her a lot and she thought her left leg pain was caused due to the compression wrap and hence removed it and has significant lymphedema. There is no inflammation on this left lower extremity. 06/17/17 on evaluation today patient appears to be doing very well and in fact is completely healed in regard to her ulcerations. Unfortunately however she does have continued issues with lymphedema nonetheless. We did order compression garments for her unfortunately she states that the size that she received were large although we ordered medium. Obviously this means she is not getting the optimal compression. She does not have those with her today and therefore  we could not confirm and contact the company on her behalf. Nonetheless she does state that she is going to have her husband bring them by tomorrow so that we can verify and then get in touch with the company. No fevers, chills, nausea, or vomiting noted at this time. Overall patient is doing better otherwise and I'm pleased with the progress she has made. 07/01/17 on evaluation today patient appears to be doing  very well in regard to her bilateral lower extremity she does not have any openings at this point which is excellent news. Overall I'm pleased with how things have progressed up to this time. Since she is doing so well we did order her compression which we are seeing her today to ensure that it fits her properly and everything is doing well in that regard and then subsequently she will be discharged. ============ Old Notes: 03/31/16 patient presents today for evaluation concerning open wounds that she has over the left medial ankle region as well as the left dorsal foot. She has previously had this occur although it has been healed for a number of months after having this for about a year prior until her hospitalization on 01/05/16. At that point in time it appears that she was admitted to the hospital for left lower extremity cellulitis and was placed on vancomycin and Zosyn at that point. Eventually upon discharge on January 15, 2016 she was placed on doxycycline at that point in time. Later on 03/27/16 positive wound culture growing Escherichia coli this was switched to amoxicillin. Currently she tells me that she is having pain radiated to be a 7 out of 10 which can be as high as 10 out of 10 with palpation and manipulation of the wound. This wound appears to be mainly venous in nature due to the bilateral lower extremity venous stasis/lymphedema. This is definitely much worse on her left than the right side. She does have type 1 diabetes mellitus, hypertension, morbid obesity, and is wheelchair dependent.during the course of the hospital stay a blood culture was also obtained and fortunately appeared negative. She also had an x-ray of the tibia/fibula on the left which showed no acute bone abnormality. Her white blood cell count which was performed last on 03/25/16 was 7.3, hemoglobin 12.8, protein 7.1, albumin 3.0. Her urine culture appeared to be negative for any specific organisms. Patient did  have a left lower extremity venous duplex evaluation for DVT . This did not include venous reflux studies but fortunately was negative for DVT Patient also had arterial studies performed which revealed that she had a . normal ABI on the right though this was unable to be performed on the left secondary to pain that she was having around the ankle region due to the wound. However it was stated on report that she had biphasic pulses and apparently good blood flow. ========== 06/03/17 she is here in follow-up evaluation for right lower extremity ulcer. The right lower sure he has healed but she has reopened to the left medial malleolus and dorsal foot with weeping. She is waiting for new compression garments to arrive from home health, the previous compression garments were ill fitting. We will continue with compression bilaterally and follow-up in 2 weeks Readmission: 08/05/17 on evaluation today patient appears to be doing somewhat poorly in regard to her left lower extremity especially although the right lower extremity has a small area which may no longer be open. She has been having a lot of drainage from the  left lower extremity however he tells me that she has not been able to use the EXTREMIT-EASE Compression at this point. She states that she did better and was able to actually apply the Juxta-Lite compression although the wound that she has is too large and therefore really does not compress which is why she cannot wear it at this point. She has no one who can help her put it on regular basis her son can sometimes but he's not able to do it most of the time. I do believe that's why she has begun to weave and have issues as she is currently yet again. No fevers, chills, nausea, or vomiting noted at this time. Patient is no evidence of dementia. 08/12/17 on evaluation today patient appears to still be doing fairly well in regard to the draining areas/weeping areas at this point. With that being  said she unfortunately did go to the ER yesterday due to what was felt to be possibly a cellulitis. They place her on doxycycline by mouth and discharge her home. She definitely was not admitted. With that being said she states she has had more discomfort which has been unusual for her even compared to prior times and she's had infections.08/12/17 on evaluation today patient appears to still be doing fairly well in regard to the draining areas/weeping areas at this point. With that being said she unfortunately did go to the ER yesterday due to what was felt to be possibly a cellulitis. They place her on doxycycline by mouth and discharge her home. She definitely was not admitted. With that being said she states she has had more discomfort which has been unusual for her even compared to prior times and she's had infections. 08/19/17 put evaluation today patient tells me that she's been having a lot of what sounds to be neuropathic type pain in regard to her left lower extremity. She has been using over-the-counter topical bins again which some believe. That in order to apply the she actually remove the wrap we put on her last Wednesday on Thursday. Subsequently she has not had anything on compression wise since that time. The good news is a lot of the weeping areas appear to have closed at this point again I believe she would do better with compression but we are struggling to get her to actually use what she needs to at this point. No fevers, chills, nausea, or vomiting noted at this time. 09/03/17 on evaluation today patient appears to be doing okay in regard to her lower extremities in regard to the lymphedema and weeping. Fortunately she does not seem to show any signs of infection at this point she does have a little bit of weeping occurring in the right medial malleolus area. With that being said this does not appear to be too significant which is good news. 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 She has severe skin damage secondary to both of these features involving the dorsal left foot and medial left ankle and lower leg. Still has open areas in the left anterior foot. The area on the right closed over. She uses her own juxta light stockings. She does not have an arterial issue 09/16/17 on evaluation today patient actually appears to be doing excellent in regard to her bilateral lower extremity swelling. The Juxta-Lite compression wrap seem to be doing very well for her. She has not however been using the portion that goes over her foot.  Her left foot still is draining a little bit not nearly as significant as it has been in the past but still I do believe that she likely needs to utilize the full wrap including the foot portion of this will improve as well. She also has been apparently putting on a significant amount of Vaseline which also think is not helpful for her. I recommended that if she feels she needs something for moisturizer Eucerin will probably be better. 09/30/17 on evaluation today patient presents with several new open areas in regard to her left lower extremity although these appear to be minimal and mainly seem to be more moisture breakdown than anything. Fortunately she does not seem to have any evidence of infection which is great news. She has been tolerating the dressing changes without complication we are using silver alginate on the foot she has been using AB pads to have the legs and using her Juxta- Lite compression which seems to be controlling her swelling very well. Overall I'm pleased with the poor way she has progressed. 10/14/17 on evaluation today patient appears to be doing better in regard to her left lower extremity areas of weeping. She does still have some discomfort although in general this does not appear to be as macerated and I think it is progressing nicely. I do think she still  needs to wear the foot portion of her Juxta- Lite in order to get the most benefit from the wrap obviously. She states she understands. Fortunately there does not appear to be evidence of infection at this time which is great news. 10/28/17 on evaluation today patient appears to be doing excellent in regard to her left lower extremity. She has just a couple areas that are still open and seem to be causing any trouble whatsoever. For that reason I think that she is definitely headed in the right direction the spots are very tiny compared to what we have been dealing with in the past. 11/11/17 on evaluation today patient appears to have a right lateral lower extremity ulcer that has opened since I last saw her. She states this is where the home health nurse that was coming out remove the dressing without wetting the alginate first. Nonetheless I do not know if this is indeed the case or not but more importantly we have not ordered home help to be coming out for her wounds at all. I'm unsure as to why they are coming out and we're gonna have to check on this and get things situated in that regard. With that being said we currently really do not need them to be coming out as the patient has been taking care of her leg herself without complication and no issues. In fact she was doing much better prior to nursing coming out. 11/25/17 on evaluation today patient actually appears to be doing fairly well in regard to her left lower extremity swelling. In fact she has very little area of weeping at this point there's just a small spot on the lateral portion of her right leg that still has me just a little bit more concerned as far as wanting to see this clear up before I discharge her to caring for this at home. Nonetheless overall she has made excellent progress. 12/09/17 on evaluation today patient appears to be doing rather well in regard to her lower extremity edema. She does have some weeping still in the left  lower extremity although the big area we were taking care of two  weeks ago actually has closed and she has another area of weeping on the left lower extremity immediately as well is the top of her foot. She does not currently have lymphedema pumps she has been wearing her compression daily on a regular basis as directed. With that being said I think she may benefit from lymphedema pumps. 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 We had a very difficult time getting and keeping this under control. 12/23/17 on evaluation today patient actually appears to be doing a little bit more poorly in regard to her bilateral lower extremities. She has been tolerating the Juxta-Lite compression wraps. Unfortunately she has two new ulcers on the right lower extremity and left lower Trinity ulceration seems to be larger. Obviously this is not good news. She has been tolerating the dressings without complication. 12/30/17 on evaluation today patient actually appears to be doing much better in regard to her bilateral lower extremity edema. She continues to have some issues with ulcerations and in fact there appears to be one spot on each leg where the wrap may have caused a little bit of a blister which is subsequently opened up at this point is given her pain. Fortunately it does not appear to be any evidence of infection which is good news. No fevers chills noted. 01/13/18 on evaluation today patient appears to be doing rather well in regard to her bilateral lower extremities. The dressings did get kind of stuck as far as the wound beds are concerned but again I think this is mainly due to the fact that she actually seems to be showing signs of healing which is good news. She's not having as much drainage therefore she was having more of the dressing sticking.  Nonetheless overall I feel like her swelling is dramatically down compared to previous. 01/20/18 on evaluation today patient unfortunately though she's doing better in most regards has a large blister on the left anterior lower extremity where she is draining quite significantly. Subsequently this is going to need debridement today in order to see what's underneath and ensure she does not continue to trapping fluid at this location. Nonetheless No fevers, chills, nausea, or vomiting noted at this time. 01/27/18 on evaluation today patient appears to be doing rather well at this point in regard to her right lower extremity there's just a very small area that she still has open at this point. With that being said I do believe that she is tolerating the compression wraps very well in making good progress. Home health is coming out at this point to see her. Her left lower extremity on the lateral portion is actually what still mainly open and causing her some discomfort for the most part 02/10/18 on evaluation today patient actually appears to be doing very well in regard to her right lower extremity were all the ulcers appear to be completely close. In regard to the left lower extremity she does have two areas still open and some leaking from the dorsal surface of her foot but this still seems to be doing much better to me in general. 02/24/18 on evaluation today patient actually appears to be doing much better in regard to her right lower extremity this is still completely healed. Her left lower extremity is also doing much better fortunately she has no evidence of infection. The one  area that is gonna require some debridement is still on the left anterior shin. Fortunately this is not hurting her as badly today. 03/10/18 on evaluation today patient appears to be doing better in some regards although she has a little bit more open area on the dorsal foot and she also has some issues on the medial portion of  the left lower extremity which is actually new and somewhat deep. With that being said there fortunately does not appear to be any significant signs of infection which is good news. No fevers, chills, nausea, or vomiting noted at this time. In general her swelling seems to be doing fairly well which is good news. 03/31/18 on evaluation today patient presents for follow-up concerning her left lower extremity lymphedema. Unfortunately she has been doing a little bit more poorly since I last saw her in regard to the amount of weeping that she is experiencing. She's also having some increased pain in the anterior shin location. Unfortunately I do not feel like the patient is making such good progress at this point a few weeks back she was definitely doing much better. 04/07/18 on evaluation today patient actually appears to be showing some signs of improvement as far as the left lower extremity is concerned. She has been tolerating the dressing changes and it does appear that the Drawtex did better for her. With that being said unfortunately home health is stating that they cannot obtain the Drawtex going forward. Nonetheless we're gonna have to check and see what they may be able to get the alginate they were using was getting stuck in causing new areas of skin being pulled all that with and subsequently weep and calls her to worsen overall this is the first time we've seen improvement at this time. 04/14/18 on evaluation today patient actually appears to be doing rather well at this point there does not appear to be any evidence of infection at this time and she is actually doing excellent in regard to the weeping in fact she almost has no openings remaining even compared to just last week this is a dramatic improvement. No fevers chills noted 04/21/18 evaluation today patient actually appears to be doing very well. She in fact is has a small area on the posterior lower extremity location and she has  a small area on the dorsal surface of her foot that are still open both of which are very close to closing. We're hoping this will be close shortly. She brought her Juxta-Lite wrap with her today hoping that would be able to put her in it unfortunately I don't think were quite at that point yet but we're getting closer. 04/28/18 upon evaluation today patient actually appears to be doing excellent in regard to her left lower extremity ulcer. In fact the region on the posterior lower extremity actually is much smaller than previously noted. Overall I'm very happy with the progress she has made. She again did bring her Juxta-Lite although we're not quite ready for that yet. 05/11/18 upon evaluation today patient actually appears to be doing in general fairly well in regard to her left lower Trinity. The swelling is very well controlled. With that being said she has a new area on the left anterior lower extremity as well as between the first and second toes of her left foot that was not present during the last evaluation. The region of her posterior left lower extremity actually appears to be almost completely healed. T be honest I'm very pleased  with o the way that stands. Nonetheless I do believe that the lotion may be keeping the area to moist as far as her legs are concerned subsequently I'm gonna consider discontinuing that today. 05/26/18 on evaluation today patient appears to be doing rather well in regard to her left lower should be ulcers. In fact everything appears to be close except for a very small area on the left posterior lower extremity. Fortunately there does not appear to be any evidence of infection at this time. Overall very pleased with her progress. 06/02/18 and evaluation today patient actually appears to be doing very well in regard to her lower extremity ulcers. She has one small area that still continues to weep that I think may benefit her being able to justify lotion and user  Juxta-Lite wraps versus continued to wrap her. Nonetheless I think this is something we can definitely look into at this point. 06/23/18 on evaluation today patient unfortunately has openings of her bilateral lower extremities. In general she seems to be doing much worse than when I last saw her just as far as her overall health standpoint is concerned. She states that her discomfort is mainly due to neuropathy she's not having any other issues otherwise. No fevers, chills, nausea, or vomiting noted at this time. 06/30/18 on evaluation today patient actually appears to be doing a little worse in regard to her right lower extremity her left lower extremity of doing fairly well. Fortunately there is no sign of infection at this time. She has been tolerating the dressing changes without complication. Home health did not come out like they were supposed to for the appropriate wrap changes. They stated that they never received the orders from [place] which were fax. Nonetheless we will send a copy of the orders with the patient today as well. 07/07/18 on evaluation today patient appears to be doing much better in regard to lower extremities. She still has several openings bilaterally although since I last saw her her legs did show obvious signs of infection when she later saw her nurse. Subsequently a culture was obtained and she is been placed on Bactrim and Keflex. Fortunately things seem to be looking much better it does appear she likely had an infection. Again last week we'd even discussed it but again there really was not any obvious sign that she had infection therefore we held off on the antibiotics. Nonetheless I'm glad she's doing better today. 07/14/18 on evaluation today patient appears to be doing much better regarding her bilateral lower Trinity's. In fact on the right lower for me there's nothing open at this point there are some dry skin areas at the sites where she had infection. Fortunately there is  no evidence of systemic infection which is excellent news. No fevers chills noted 07/21/18 on evaluation today patient actually appears to be doing much better in regard to her left lower extremity ulcers. She is making good progress and overall I feel like she's improving each time I see her. She's having no pain I do feel like the infection is completely resolved which is excellent news. No fevers, chills, nausea, or vomiting noted at this time. 07/28/18 on evaluation today patient appears to be doing very well in regard to her left lower [employer]. Everything seems to be showing signs of improvement which is excellent news. Overall very pleased with the progress that has been made. Fortunately there's no evidence of active infection at this time also excellent news. 08/04/18 on evaluation  today patient appears to be doing more poorly in regard to her bilateral lower extremities. She has two new areas open up on the right and these were completely closed as of last week. She still has the two spots on the left which in my pinion seem to be doing better. Fortunately there's no evidence of infection again at this point. 08/11/18 on evaluation today patient actually appears to be doing very well in regard to her bilateral lower Trinity wounds that all seem to be doing better and are measures smaller today. Fortunately there's no signs of infection. No fevers, chills, nausea, or vomiting noted at this time. 08/18/18 on evaluation today patient actually appears to be doing about the same inverter bilateral lower extremities. She continues to have areas that blistering open as was drain that fortunately nothing too significant. Overall I feel like Drawtex may have done better for her however compared to the collagen. 08/25/18 on evaluation today patient appears to be doing a little bit more poorly today even compared to last time I saw her. Again I'm not exactly sure why she's making worse progress over  the past several weeks. I'm beginning to wonder if there is some kind of underlying low level infection causing this issue. I did actually take a culture from the left anterior lower extremity but it was a new wound draining quite a bit at this point. Unfortunately she also seems to be having more pain which is what also makes me worried about the possibility of infection. This is despite never erythema noted at this point. 09/01/18 on evaluation today patient actually appears to be doing a little worse even compared to last week in regard to bilateral lower extremities. She did go to the hospital on the 16th was given a dose of IV Zosyn and then discharged with a recommendation to continue with the Bactrim that I previously prescribed for her. Nonetheless she is still having a lot of discomfort she tells me as well at this time. This is definitely unfortunate. No fevers, chills, nausea, or vomiting noted at this time. 09/08/18 on evaluation today patient's bilateral lower extremities actually appear to be shown signs of improvement which is good news. Fortunately there does not appear to be any signs of active infection I think the anabiotic is helping in this regard. Overall I'm very pleased with how she is progressing. 09/15/18 patient was actually seen in ER yesterday due to her legs as well unfortunately. She states she's been having a lot of pain and discomfort as well as a lot of drainage. Upon inspection today the patient does have a lot of swelling and drainage I feel like this is more related to lymphedema and poor fluid control than it is to infection based on what I'm seeing. The physician in the emergency department also doubted that the patient was having a significant infection nonetheless I see no evidence of infection obvious at this point although I do see evidence of poor fluid control. She still not using a compression pumps, she is not elevating due to her lift chair as well as her  hospital bed being broken, and she really is not keeping her legs up as much as they should be and also has been taking off her wraps. All this combined I think has led to poor fluid control and to be honest she may be somewhat volume overloaded in general as well. I recommend that she may need to contact your physician to see if a  prescription for a diuretic would be beneficial in their opinion. As long as this is safe I think it would likely help her. 09/29/18 on evaluation today patient's left lower extremity actually appears to be doing quite a bit better. At least compared to last time that I saw her. She still has a large area where she is draining from but there's a lot of new skin speckled trout and in fact there's more new skin that there are open areas of weeping and drainage at this point. This is good news. With regard to the right lower extremity this is doing much better with the only open area that I really see being a dry spot on the right lateral ankle currently. Fortunately there's no signs of active infection at this time which is good news. No fevers, chills, nausea, or vomiting noted at this time. The patient seems somewhat stressed and overwhelmed during the visit today she was very lethargic as such. She does and she is not taking any pain medications at this point. Apparently according to her husband are also in the process of moving which is probably taking its toll on her as well. 10/06/18 on evaluation today patient appears to be doing rather well in regard to her lower extremities compared to last evaluation. Fortunately there's no signs of active infection. She tells me she did have an appointment with her primary. Nonetheless he was concerned that the wounds were somewhat deep based on pictures but we never actually saw her legs. She states that he had her somewhat worried due to the fact that she was fearing now that she was [place] have to have an amputation. With that being  said based on what I'm seeing check she looks better this week that she has the last two times I've seen her with much less drainage I'm actually pleased in this regard. That doesn't mean that she's out of the water but again I do not think what the point of talking about education at all in regard to her leg. She is very happy to hear this. She is also not having as much pain as she was having last week. 10/13/18 unfortunately on evaluation today patient still continues to have a significant amount of drainage she's not letting home health actually apply the compression dressings at this point. She's trying to use of Juxta-Lite of the top of Kerlex and the second layer of the three layer compression wrap. With that being said she just does not seem to be making as good a progress as I would expect if she was having the compression applied and in place on a regular basis. No fevers, chills, nausea, or vomiting noted at this time. 10/20/18 on evaluation today patient appears to be doing a little better in regard to her bilateral lower extremity ulcers. In fact the right lower extremity seems to be healed she doesn't even have any openings at this point left lower extremity though still somewhat macerated seems to be showing signs of new skin growth at multiple locations throughout. Fortunately there's no evidence of active infection at this time. No fevers, chills, nausea, or vomiting noted at this time. 10/27/18 on evaluation today patient appears to be doing much better in regard to her left lower Trinity ulcer. She's been tolerating the laptop complication and has minimal drainage noted at this point. Fortunately there's no signs of active infection at this time. No fevers, chills, nausea, or vomiting noted at this time. 11/03/18 on evaluation today  patient actually appears to be doing excellent in regard to her left lower extremity. She is having very little drainage at this point there does not appear  to be any significant signs of infection overall very pleased with how things have gone. She is likewise extremely pleased still and seems to be making wonderful progress week to week. I do believe antibiotics were helpful for her. Her primary care provider did place on amateur clean since I last saw her. 11/17/18 on evaluation today patient appears to be doing worse in regard to her bilateral lower extremities at this point. She is been tolerating the dressing changes without complication. With that being said she typically takes the Coban off fairly quickly upon arriving home even after being seen here in the clinic and does not allow home health reapply command as part of the dressing at home. Therefore she said no compression essentially since I last saw her as best I can tell. With that being said I think it shows and how much swelling she has in the open wounds that are noted at this point. Fortunately there's no signs of infection but unfortunately if she doesn't get this under control I think she will end up with infection and more significant issues. 11/24/18 on evaluation today patient actually appears to be doing somewhat better in regard to her bilateral lower extremities. 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 Nonetheless that is doing better currently so she should be able to attempt reinitiating the lymphedema pumps at this time. No fevers, chills, nausea, or vomiting noted at this time. 12/01/18 upon evaluation today patient's left lower extremity appears to be doing quite well unfortunately her right lower extremity is not doing nearly as well. She has been tolerating the dressing changes without complication unfortunately she did not keep a wrap on the right at this time. Nonetheless I believe this has led to increased swelling and weeping in  the world is actually much larger than during the last evaluation with her. 12/08/18 on evaluation today patient appears to be doing about the same at this point in regard to her right lower extremity. There is some more palatable to touch I'm concerned about the possibility of there being some infection although I think the main issue is she's not keeping her compression wrap on which in turn is not allowing this area to heal appropriately. 12/22/18 on evaluation today patient appears to be doing better in regard to left lower extremity unfortunately significantly worse in regard to the right lower extremity. The areas of blistering and necrotic superficial tissue have spread and again this does not really appear to be signs of infection and all she just doesn't seem to be doing nearly as well is what she has been in the past. Overall I feel like the Augmentin did absolutely nothing for her she doesn't seem to have any infection again I really didn't think so last time either is more of a potential preventative measure and hoping that this would make some difference but I think the main issue is she's not wearing her compression. She tells me she cannot wear the [place] been we put on she takes it off pretty much upon getting home. Subsequently she worshiped Juxta-Lite when I questioned her about how often she wears it this is no  more than three hours a day obviously that leaves 21 hours that she has no compression and this is obviously not doing well for her. Overall I'm concerned that if things continue to worsen she is at great risk of both infection as well as losing her leg. 01/05/19 on evaluation today patient appears to be doing well in regard to her left lower extremity which he is allowing [place] to wrap and not so well with regard to her right lower extremity which she is not allowing [place] to really wrap and keep the wrap on. She states that it hurts too badly whenever it's wrapped and she ends up  having to take it off. She's been using the Juxta-Lite she tells me up to six hours a day although I question whether or not that's really been the case to be honest. Previously she told me three hours today nonetheless obviously the legs as long as the wrap is doing great when she is not is doing much more poorly. 01/12/2019 on evaluation today patient actually appears to be doing a little better in my opinion with regard to her right lower extremity ulcer. She has a small open area on the left lower extremity unfortunately but again this I think is part of the normal fluctuation of what she is going to have to expect with regard to her legs especially when she is not using her lymphedema pumps on a regular basis. Subsequently based on what I am seeing today I think that she does seem to be doing slightly better with regard to her right lower extremity she did see her primary care provider on Monday they felt she had an infection and placed her on 2 antibiotics. Both Cipro and clindamycin. Subsequently again she seems possibly to be doing a little bit better in regards to the right lower extremity she also tells me however she has been wearing the compression wrap over the past week since I spoke with her as well that is a Kerlix and Coban wrap on the right. No fevers, chills, nausea, vomiting, or diarrhea. 01/19/2019 on evaluation today patient appears to be doing better with regard to her bilateral lower extremities especially the right. I feel like the compression has been beneficial for her which is great news. She did get a call from her primary care provider on her way here today telling her that she did have methicillin- resistant Staphylococcus aureus and he was calling in a couple new antibiotics for her including a ointment to be applied she tells me 3 times a day. With that being said this sounds like likely to be Bactroban which I think could be applied with each dressing/wrap change but I  would not be able to accommodate her applying this 3 times a day. She is in agreement with the least doing this we will add that to her orders today. 01/26/2019 on evaluation today patient actually appears to be doing much better with regard to her right lower extremity. Her left lower extremity is also doing quite well all things considering. Fortunately there is no evidence of active infection at this time. No fevers, chills, nausea, vomiting, or diarrhea. 02/02/2019 on evaluation today patient appears to be doing much better compared to her last evaluation. Little by little off like her right leg is returning more towards normal. There does not appear to be any signs of active infection and overall she seems to be doing quite well which is great news. I am very pleased  in this regard. No fevers, chills, nausea, vomiting, or diarrhea. 02/09/2019 upon evaluation today patient appears to be doing better with regard to her bilateral lower extremities. She has been tolerating the dressing changes without complication. Fortunately there is no signs of active infection at this time. No fevers, chills, nausea, vomiting, or diarrhea. 02/23/2019 on evaluation today patient actually appears to be doing quite well with regard to her bilateral lower extremities. She has been tolerating the dressing changes without complication. She is even used her pumps one time and states that she really felt like it felt good. With that being said she seems to be in good spirits and her legs appear to be doing excellent. 03/09/2019 on evaluation today patient appears to be doing well with regard to her right lower extremity there are no open wounds at this time she is having some discomfort but I feel like this is more neuropathy than anything. With regard to her left lower extremity she had several areas scattered around that she does have some weeping and drainage from but again overall she does not appear to be having any  significant issues and no evidence of infection at this time which is good news. 03/23/2019 on evaluation today patient appears to be doing well with regard to her right lower extremity which she tells me is still close she is using her juxta light here. Her left lower extremity she mainly just has an area on the foot which is still slightly draining although this also is doing great. Overall very pleased at this time. 04/06/2019 patient appears to be doing a little bit worse in regard to her left lower extremity upon evaluation today. She feels like this could be becoming infected again which she had issues with previous. Fortunately there is no signs of systemic infection but again this is always a struggle with her with her legs she will go from doing well to not so well in a very short amount of time. 04/20/2019 on evaluation today patient actually appears to be doing quite well with regard to her right lower extremity I do not see any signs of active infection at this time. Fortunately there is no fever chills noted. She is still taking the antibiotics which I prescribed for her at this point. In regard to the left lower extremity I do feel like some of these areas are better although again she still is having weeping from several locations at this time. 04/27/2019 on evaluation today patient appears to be doing about the same if not slightly worse in regard to her left lower extremity ulcers. She tells me when questioned that she has been sleeping in her Hoveround chair in fact she tells me she falls asleep without even knowing it. I think she is spending a whole lot of time in the chair and less time walking and moving around which is not good for her legs either. On top of that she is in a seated position which is also the worst position she is not really elevating her legs and she is also not using her lymphedema pumps. All this is good to contribute to worsening of her condition in  general. 05/18/2019 on evaluation today patient appears to be doing well with regard to her lower extremity on the right in fact this is showing no signs of any open wounds at this time. On the left she is continuing to have issues with areas that do drain. Some of the regions have healed and  there are couple areas that have reopened. She did go to the ER per the patient according to recommendations from the home health nurse due to what she was seen when she came out on 05/13/2019. Subsequently she felt like the patient needed to go to the hospital due to the fact that again she was having "milky white discharge" from her leg. Nonetheless she had and then was placed on doxycycline and subsequently seems to be doing better. 06/01/2019 upon evaluation today patient appears to be doing really in my opinion about the same. I do not see any signs of active infection which is good news. Overall she still has wounds over the bilateral lower extremities she has reopened on the right but this appears to be more of a crack where there is weeping/edema coming from the region. I do not see any evidence of infection at either site based on what I visualized today. 07/13/2019 upon evaluation today patient appears to be doing a little worse compared to last time I saw her. She since has been in the hospital from 06/21/2019 through 06/29/2019. This was secondary to having Covid. During that time they did apply lotion to her legs which unfortunately has caused her to develop a myriad of open wounds on her lower extremities. Her legs do appear to be doing better as far as the overall appearance is concerned but nonetheless she does have more open and weeping areas. 07/27/2019 upon evaluation today patient appears to be doing more poorly to be honest in regard to her left lower extremity in particular. There is no signs of systemic infection although I do believe she may have local infection. She notes she has been having  a lot of blue/green drainage which is consistent potentially with Pseudomonas. That may be something that we need to consider here as well. The doxycycline does not seem to have been helping. 08/03/2019 upon evaluation today patient appears to be doing a little better in my opinion compared to last week's evaluation. Her culture I did review today and she is on appropriate medications to help treat the Enterobacter that was noted. Overall I feel like that is good news. With that being said she is unfortunately continuing to have a lot of drainage and though it is doing better I still think she has a long ways to go to get things dried up in general. Fortunately there is no signs of systemic infection. 08/10/2019 upon evaluation today patient appears to be doing may be slightly better in regard to her left lower extremity the right lower extremity is doing much better. Fortunately there is no signs of infection right now which is good news. No fevers, chills, nausea, vomiting, or diarrhea. 08/24/2019 on evaluation today patient appears to be doing slightly better in regard to her lower extremities. The left lower extremity seems to be healed the right lower extremity is doing better though not completely healed as far as the openings are concerned. She has some generalized issues here with edema and weeping secondary to her lymphedema though again I do believe this is little bit drier compared to prior weeks evaluations. In general I am very pleased with how things seem to be progressing. No fevers, chills, nausea, vomiting, or diarrhea. 08/31/2019 upon evaluation today patient actually seems to making some progress here with regard to the left lower extremity in particular. She has been tolerating the dressing changes without complication. Fortunately there is no signs of active infection at this time. No fevers,  chills, nausea, vomiting, or diarrhea. She did see Dr. Doren Custard and he did note that she did have  a issue with the left great saphenous vein and the small saphenous vein in the leg. With that being said he was concerned about the possibility of laser ablation not being extremely successful. He also mentioned a small risk of DVT associated with the procedure. However if the wounds do not continue to improve he stated that that would probably be the way to go. Fortunately the patient's legs do seem to be doing much better. 09/07/2019 upon evaluation today patient appears to be doing better with regard to her lower extremities. She has been tolerating the dressing changes without complication. With that being said she is showing signs of improvement and overall very pleased. There are some areas on her leg that I think we do need to debride we discussed this last week the patient is in agreement with doing that as long as it does not hurt too badly. 09/14/2019 upon evaluation today patient appears to be doing decently well with regard to her left lower extremity. She is not having near as much weeping as she has had in the past things seem to be drying up which is good news. There is no signs of active infection at this time. 09/21/19 upon evaluation today patient appears to be doing better in regard overall to her bilateral lower extremities. She again has less open than she did previous and each week I feel like this is getting better. Fortunately there is no signs of active infection at this time. No fevers, chills, nausea, vomiting, or diarrhea. 09/28/2019 upon evaluation today patient actually appears to be showing signs of improvement with regard to her left lower extremity. Unfortunately the right medial lower extremity around the ankle region has reopened to some degree but this appears to be minimal still which is good news. There is no signs of active infection at this time which is also good news. 10/12/2019 upon evaluation today patient appears to be doing okay with regard to her bilateral  lower extremities today. The right is a little bit worse then last evaluation 2 weeks ago. The left is actually doing a little better in my opinion. Overall there is no signs of active infection at this time that I see. Obviously that something we have to keep a close eye on she is very prone to this with the significant and multiple openings that she has over the bilateral lower extremities. 10/19/2019 upon evaluation today patient appears to be doing about the best that I have seen her in quite some time. She has been tolerating the dressing changes without complication. There does not appear to be any signs of active infection and overall I am extremely happy with the way her legs appeared. She is drying up quite nicely and overall is having less pain. 11/09/2019 upon evaluation today patient appears to be doing better in regard to her wounds. She seems to be drying up more and more each time I see her this is just taking a very long time. Fortunately there is no signs of active infection at this time. 11/23/2019 upon evaluation today patient actually appears to be doing excellent in regard to her lower extremities at this point compared to where she has been. Fortunately there is no signs of active infection at this time. She did go to the hospital last week for nausea and vomiting completely unrelated to her wounds. Fortunately she is doing better  she was given some Reglan and got better. She had associated abdominal pain but they never found out what was going on. 12/07/2019 upon evaluation today patient appears to be doing well for the most part in regard to her legs. She unfortunately has not been keeping the [place] portion of her wraps on therefore the compression has not really been sufficient for what it is supposed to be. Nonetheless she tells me that it just hurt too bad therefore she removed it. 12/21/2019 upon evaluation today patient actually appears to be doing quite well with regard to her  legs. I do feel like she has been making progress which is great news and overall there is no signs of active infection at this time. No fevers, chills, nausea, vomiting, or diarrhea. 01/04/2020 upon evaluation today patient presents for follow-up concerning her lower extremity edema bilaterally. She still has open wounds she has not been using her lymphedema pumps. She is also not been utilizing her compression wraps appropriately she tends to unwrap them, take them off, or states that they hurt. Obviously the reason they hurt is because her legs start to swell but the issue is if she would use her compression/lymphedema pumps regularly she would not swell and she would have the pain. Nonetheless she has not even picked them up once honestly over the past several months and may be even as much as in the past year based on my opinion and what have seen. She tells me today that after last week when I talked about this with her specifically actually that was 2 weeks ago that she "forgot". 8//21 on evaluation today patient appears to be doing a little better in regard to her legs bilaterally. Fortunately there is no signs of active infection at this time. She tells me that she used her lymphedema pumps all of one time over the past 2 weeks since I last saw her. She tells me that she has been too busy in order to continue to use these. 02/01/2020 on evaluation today patient appears to be doing some better in regard to her wounds in general in her legs. We felt the right was healed although is not completely it does appear to be doing better she tells me she has been using her lymphedema pumps that she has had this six times since I last saw her. Obviously the more she does that the better she would do my opinion 02/15/2020 upon evaluation today patient appears to be doing about the same in regard to her legs. She tells me that she is pumping I'm still not sure how much she does to be perfectly honest. However  even if she does a little bit here and there I guess that is better than nothing. Fortunately there is no sign of active infection at this time which is great news. No fevers, chills, nausea, vomiting, or diarrhea. 02/29/2020 on evaluation today patient actually appears to be doing quite well all things considered this week. She has been tolerating the dressing changes without complication. Fortunately there is no signs of active infection at this time. No fevers, chills, nausea, vomiting, or diarrhea. 03/14/2020 upon evaluation today patient appears to be doing really about the same in regard to her legs. There is no signs of improvement overall and she as noted from home health does not appear to be elevating her legs he can get into her lift chair. There is too much stuff piled up on it the patient tells me. She also  tells me she cannot really use her pumps effectively due to the fact that she cannot have any space to get them on. Finally she is also not really elevating her legs because she is not sleeping in her bed she is sleeping in her chair currently and again overall I think everything that she is done in combination has been exactly the wrong thing for what she needs for her legs. 04/04/2020 upon evaluation today patient appears to be doing well at this time with regard to her legs. She is actually been pumping, keeping her wraps on, and to be honest she seems to be doing dramatically better the right leg is excellent the left leg is also excellent and measuring much smaller than previous. 04/18/2020 upon evaluation today patient actually is continue to make good progress in regard to her lower extremities bilaterally. Everything is improving and less wet that has been in the past overall I am extremely pleased with where things stand and I think that she is making great progress. The patient tells me she still continue to use her compression pumps 05/02/2020 on evaluation today patient appears  to be doing well at this time in regard to her left leg which is showing signs of drying up. With that being said she does have a lot of lymphedema type crusty skin around the toes of her left foot and the right medial ankle which has opened at this point. Fortunately there is no signs of active infection systemically at this point or even locally for that matter. 05/23/2020 on evaluation today patient appears to be doing well with regard to her lower extremities. Fortunately there is no signs of active infection at this time. No fever chills noted. She has been very depressed however she tells me. 06/06/2020 patient came in today for evaluation in regard to her bilateral lower extremity ulcerations. With that being said she came in feeling okay and actually laughing and joking around with the staff checking her in. Subsequently however she had a coughing spell and following the coughing spell it was a dramatic conversion from being jovial and joking around to being extremely short of breath her vital signs actually dropped in regard to her blood pressure from around 175 to down around 433 for systolic and from around 96 diastolic down to around 70. With that being said she also accompanied this with an increase in her respiratory rate which was also quite significant. Nonetheless I actually upon going into see her was extremely worried we called EMS to have her transported to the ER for further evaluation and treatment. She continued until EMS got here to be extremely short of breath even on 6 L of oxygen. Her oxygen saturation did come up to 99% but overall it was only 95 before which was not terrible she did feel like the oxygen helped her feel somewhat better however. She has a history of asthma but again even the coughing spell really should not have done this degree of alteration in her demeanor from where she was just before to after the coughing spell. She tells [place] however she has been feeling  somewhat abnormal since Friday although she could not really pinpoint exactly what was going on. 06/27/2020 plan evaluation today patient appears to be doing okay in regard to her leg ulcers. She is really not showing a lot of improvement to be honest and she still has a lot of weeping. With that being said after I last saw her we called  EMS to take her to the hospital she actually did not go she went to see her primary care provider who according to the patient have not identified and read through the note states that everything checked out okay. Nonetheless the patient has continued to have bouts where she gets very short of breath for seemingly no reason whatsoever. That happened even the same night that I saw her last time. This was after she refused transport to the hospital. With that being said she also tells me that just getting dressed to come here is quite a chore which is putting on her shirt causing her to have to stop to catch her breath. Obviously this is not normal and I feel like there is something going on. She may need a referral to be seen by cardiology ASAP. She does see her primary care provider early next week she tells me 07/11/2020 upon evaluation today patient's wounds again appear to be doing about the same she mainly has weeping of the bilateral lower extremities both are very swollen today she tells me she has been using her pumps but last time I saw her she told me she did not even have her pumps out that she had "packed them away because they had too much stuff in their house. Apparently her husband has been bringing stuff in from storage units. She then subsequently told me that she had so much stuff in her house that she has been staying up all night and did not go to bed till 7:00 this morning because she was cleaning up stuff. Nonetheless I am still unsure as to whether or not she is using the pumps based on what I am seeing I would think probably not. Nonetheless she  has been keeping the compression wraps in place that is at least something Electronic Signature(s) Signed: 07/11/2020 2:27:15 PM By: [name] PA-C Entered By: [name] on 07/11/2020 14:27:15 -------------------------------------------------------------------------------- Physical Exam Details Patient Name: Date of Service: JULYANA, WOOLVERTON 07/11/2020 11:00 A M Medical Record Number: [id number] Patient Account Number: [id number] Date of Birth/Sex: Treating RN: 1950-11-23 (70 y.o. [name] Primary Care Provider: [name] Other Clinician: Referring Provider: Treating Provider/Extender: Darlen Round in Treatment: 41 Constitutional Well-nourished and well-hydrated in no acute distress. Respiratory normal breathing without difficulty. Psychiatric this patient is able to make decisions and demonstrates good insight into disease process. Alert and Oriented x 3. pleasant and cooperative. Notes Upon inspection patient's wound bed actually showed signs of good granulation at this time. There does not appear to be any signs of active infection which is great news and overall I am pleased with where things stand. No fevers, chills, nausea, vomiting, or diarrhea. Unfortunately she is significantly swollen on both lower extremities but really especially the right. Electronic Signature(s) Signed: 07/11/2020 2:27:39 PM By: [name] PA-C Entered By: [name] on 07/11/2020 14:27:39 -------------------------------------------------------------------------------- Physician Orders Details Patient Name: Date of Service: [name]. 07/11/2020 11:00 A M Medical Record Number: [id number] Patient Account Number: [id number] Date of Birth/Sex: Treating RN: [birth year] (70 y.o. [name] Primary Care Provider: [name] Other Clinician: Referring Provider: Treating Provider/Extender: Darlen Round in Treatment:  [phone] Verbal / Phone Orders: No Diagnosis Coding ICD-10 Coding Code Description E11.622 Type 2 diabetes mellitus with other skin ulcer I89.0 Lymphedema, not elsewhere classified I87.331 Chronic venous hypertension (idiopathic) with ulcer and inflammation of right lower extremity I87.332 Chronic venous hypertension (  idiopathic) with ulcer and inflammation of left lower extremity L97.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 leg with fat layer exposed L97.822 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 Bathing/ Shower/ Hygiene May shower and wash wound with soap and water. - with dressing changes, wash both legs with wash cloth and soap and water with dressing changes Edema Control - Lymphedema / SCD / Other Bilateral Lower Extremities Lymphedema Pumps. Use Lymphedema pumps on leg(s) 2-3 times a day for 45-60 minutes. If wearing any wraps or hose, do not remove them. Continue exercising as instructed. Elevate legs to the level of the heart or above for 30 minutes daily and/or when sitting, a frequency of: Avoid standing for long periods of time. Exercise regularly Home Health No change in wound care orders this week; continue Home Health for wound care. May utilize formulary equivalent dressing for wound treatment orders unless otherwise specified. Other Home Health Orders/Instructions: - [facility] Wound #61 - Lower Leg Wound Laterality: Left, Circumferential Peri-Wound Care: Zinc Oxide Ointment 30g tube 2 x Per Week/30 Days Discharge Instructions: Apply Zinc Oxiide to weeping areas with each dressing change Peri-Wound Care: Sween  Lotion (Moisturizing lotion) (Home Health) 2 x Per Week/30 Days Discharge Instructions: Apply moisturizing lotion as directed Prim Dressing: KerraCel Ag Gelling Fiber Dressing, 4x5 in (silver alginate) (Home Health) 2 x Per Week/30 Days ary Discharge Instructions: Apply silver alginate to wound bed as instructed Secondary Dressing: Woven Gauze Sponge, Non-Sterile 4x4 in (Home Health) 2 x Per Week/30 Days Discharge Instructions: Apply over primary dressing as directed. Secondary Dressing: ABD Pad, 5x9 (Home Health) 2 x Per Week/30 Days Discharge Instructions: Apply over primary dressing as directed. Compression Wrap: ThreePress (3 layer compression wrap) (Home Health) 2 x Per Week/30 Days Discharge Instructions: Apply three layer compression as directed. Pad bend of ankle with foam or ABD pad. Wound #64 - Foot Wound Laterality: Dorsal, Left Peri-Wound Care: Zinc Oxide Ointment 30g tube 2 x Per Week/30 Days Discharge Instructions: Apply Zinc Oxiide to weeping areas with each dressing change Peri-Wound Care: Sween Lotion (Moisturizing lotion) (Home Health) 2 x Per Week/30 Days Discharge Instructions: Apply moisturizing lotion as directed Prim Dressing: KerraCel Ag Gelling Fiber Dressing, 4x5 in (silver alginate) (Home Health) 2 x Per Week/30 Days ary Discharge Instructions: Apply silver alginate to wound bed as instructed Secondary Dressing: Woven Gauze Sponge, Non-Sterile 4x4 in (Home Health) 2 x Per Week/30 Days Discharge Instructions: Apply over primary dressing as directed. Secondary Dressing: ABD Pad, 5x9 (Home Health) 2 x Per Week/30 Days Discharge Instructions: Apply over primary dressing as directed. Compression Wrap: ThreePress (3 layer compression wrap) (Home Health) 2 x Per Week/30 Days Discharge Instructions: Apply three layer compression as directed. Pad bend of ankle with foam or ABD pad. Wound #67 - Lower Leg Wound Laterality: Right, Medial Peri-Wound Care: Zinc Oxide Ointment  30g tube 2 x Per Week/30 Days Discharge Instructions: Apply Zinc Oxiide to weeping areas with each dressing change Peri-Wound Care: Sween Lotion (Moisturizing lotion) (Home Health) 2 x Per Week/30 Days Discharge Instructions: Apply moisturizing lotion as directed Prim Dressing: KerraCel Ag Gelling Fiber Dressing, 4x5 in (silver alginate) (Home Health) 2 x Per Week/30 Days ary Discharge Instructions: Apply  silver alginate to wound bed as instructed Secondary Dressing: Woven Gauze Sponge, Non-Sterile 4x4 in (Home Health) 2 x Per Week/30 Days Discharge Instructions: Apply over primary dressing as directed. Secondary Dressing: ABD Pad, 5x9 (Home Health) 2 x Per Week/30 Days Discharge Instructions: Apply over primary dressing as directed. Compression Wrap: ThreePress (3 layer compression wrap) (Home Health) 2 x Per Week/30 Days Discharge Instructions: Apply three layer compression as directed. Pad bend of ankle with foam or ABD pad. Electronic Signature(s) Signed: 07/11/2020 5:03:34 PM By: [name] PA-C Signed: 07/11/2020 6:09:51 PM By: [name] RN, BSN Entered By: [name] on 07/11/2020 12:08:27 -------------------------------------------------------------------------------- Problem List Details Patient Name: Date of Service: [name]. 07/11/2020 11:00 A M Medical Record Number: [id number] Patient Account Number: [id number] Date of Birth/Sex: Treating RN: March 22, 1951 (70 y.o. Martyn Malay, Vaughan Basta Primary Care Provider: [name] Other Clinician: Referring Provider: Treating Provider/Extender: Darlen Round in Treatment: [phone] Active Problems ICD-10 Encounter Code Description Active Date MDM Diagnosis E11.622 Type 2 diabetes mellitus with other skin ulcer 08/05/2017 No Yes I89.0 Lymphedema, not elsewhere classified 08/05/2017 No Yes I87.331 Chronic venous hypertension (idiopathic) with ulcer and inflammation of right 08/05/2017 No Yes lower  extremity I87.332 Chronic venous hypertension (idiopathic) with ulcer and inflammation of left 08/05/2017 No Yes lower extremity L97.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 leg with fat layer 08/05/2017 No Yes exposed L97.822 Non-pressure chronic ulcer of other part of left lower leg with fat layer exposed2/20/2019 No Yes L97.522 Non-pressure chronic ulcer of other part of left foot with fat layer exposed 06/01/2019 No Yes I10 Essential (primary) hypertension 08/05/2017 No Yes E66.01 Morbid (severe) obesity due to excess calories 08/05/2017 No Yes F41.8 Other specified anxiety disorders 08/05/2017 No Yes R53.1 Weakness 08/05/2017 No Yes Inactive Problems Resolved Problems Electronic Signature(s) Signed: 07/11/2020 11:24:34 AM By: [name] PA-C Entered By: [name] on 07/11/2020 11:24:34 -------------------------------------------------------------------------------- Progress Note Details Patient Name: Date of Service: [name]. 07/11/2020 11:00 A M Medical Record Number: [id number] Patient Account Number: [id number] Date of Birth/Sex: Treating RN: [birth year] (70 y.o. [name] Primary Care Provider: [name] Other Clinician: Referring Provider: Treating Provider/Extender: Darlen Round in Treatment: [phone] Subjective Chief Complaint Information obtained from Patient Bilateral reoccurring LE ulcers History of Present Illness (HPI) this patient has been seen a couple of times before and returns with recurrent problems to her right and left lower extremity with swelling and weeping ulcerations due to not wearing her compression stockings which she had been advised to do during her last discharge, at the end of June 2018. During her last visit the patient had had normal arterial blood flow and her venous reflux study did not necessitate any surgical intervention. She was recommended compression and elevation and wound  care. After prolonged treatment the patient was completely healed but she has been noncompliant with wearing or compressions.. She was here last week with an outpatient return visit planned but the patient came in a very poor general condition with altered mental status and was rushed to the ER on my request. With a history of hypertension, diabetes, TIA and right-sided weakness she was set up for an MRI on her brain and cervical spine and was sent to [facility]. Getting an MRI done was very difficult but once the workup was done she was found not to have any spinal stenosis, epidural abscess or hematoma or discitis. This was radiculopathy to be  treated as an outpatient and she was given a follow-up appointment. Today she is feeling much better alert and oriented and has come to reevaluate her bilateral lower extremity lymphedema and ulceration 03/25/2017 -- she was admitted to the hospital on 03/16/2017 and discharged on 03/18/2017 with left leg cellulitis and ulceration. She was started on vancomycin and Zosyn and x-ray showed no bony involvement. She was treated for a cellulitis with IV antibiotics changed to Rocephin and Flagyl and was discharged on oral Keflex and doxycycline to complete a 7 day course. Last hemoglobin A1c was 7.1 and her other ailments including hypertension got asthma were appropriately treated. 05/06/2017 -- she is awaiting the right size of compression stockings from [place] but other than that has been doing well. ====== Old notes 70 year old patient was seen one time last October and was lost to follow-up. She has recurrent problems with weeping and ulceration of her left lower extremity and has swelling of this for several years. It has been worse for the last 2 months. Past medical history is significant for diabetes mellitus type 2, hypertension, gout, morbid obesity, depressive disorders, hiatal hernia, migraines, status post knee surgery, risk of a cholecystectomy,  vaginal hysterectomy and breast biopsy. She is not a smoker. As noted before she has never had a venous duplex study and an arterial ABI study was attempted but the left lower extremity was noncompressible 10/01/2016 -- had a lower extremity venous duplex reflux evaluation which showed no evidence of deep vein reflux in the right or left lower extremity, and no evidence of great saphenous vein reflux more than 500 ms in the right or left lower extremity, and the left small saphenous vein is incompetent but no vascular consult was recommended. review of her electronic medical records noted that the ABI was checked in July 2017 where the right ABI was normal limits and the left ABI could not be ascertained due to pain with cuff pressure but the waveforms are within normal limits. her arterial duplex study scheduled for April 27. 10/08/2016 -- the patient has various reasons for not having a compression on and for the last 3 days she has had no compression on her left lower extremity either due to pain or the lack of nursing help. She does not use her juxta lites either. 10/15/2016 -- the patient did not keep her appointment for arterial duplex study on April 27 and I have asked her to reschedule this. Her pain is out of proportion with the physical findings and she continuously fails to wear a compression wraps and cuts them off because she says she cannot tolerate the pain. She does not use her juxta lites either. 10/22/2016 -- he has rescheduled her arterial duplex study to May 21 and her pain today is a bit better. She has not been wearing her juxta lites on her right lower extremity but now understands that she needs to do this. She did tolerate the to press compression wrap on her left lower extremity 10/29/2016 --arterial duplex study is scheduled for next week and overall she has been tolerating her compression wraps and also using her juxta lites on her right lower extremity 11/05/2016 -- the  right ABI was 0.95 the left was 1.03. The digit TBI is on the right was 0.83 on the left was 0.92 and she had biphasic flow through these vessels. The impression was that of normal lower extremity arterial study. 11/12/2016 -- her pain is minimal and she is doing very well overall. 11/26/2016 --  she has got juxta lites and her insurance will not pay for additional dual layer compression stockings. She is going to order some from [place]. 05/12/2017 -- her juxta lites are very old and too big for her and these have not been helping with compression. She did get 20-30 mm compression stockings from [place] but she and her husband are unable to put these on. I believe she will benefit from bilateral Extremit-ease, compression stockings and we will measure her for these today. 05/20/2017 -- lymphedema on the left lower extremity has increased a lot and she has a open ulceration as a result of this. The right lower extremity is looking pretty good. She has decided to by the compression stockings herself and will get reimbursed by the home health, at a later date. 05/27/2017 -- her sciatica is bothering her a lot and she thought her left leg pain was caused due to the compression wrap and hence removed it and has significant lymphedema. There is no inflammation on this left lower extremity. 06/17/17 on evaluation today patient appears to be doing very well and in fact is completely healed in regard to her ulcerations. Unfortunately however she does have continued issues with lymphedema nonetheless. We did order compression garments for her unfortunately she states that the size that she received were large although we ordered medium. Obviously this means she is not getting the optimal compression. She does not have those with her today and therefore we could not confirm and contact the company on her behalf. Nonetheless she does state that she is going to have her husband bring them by tomorrow so that we  can verify and then get in touch with the company. No fevers, chills, nausea, or vomiting noted at this time. Overall patient is doing better otherwise and I'm pleased with the progress she has made. 07/01/17 on evaluation today patient appears to be doing very well in regard to her bilateral lower extremity she does not have any openings at this point which is excellent news. Overall I'm pleased with how things have progressed up to this time. Since she is doing so well we did order her compression which we are seeing her today to ensure that it fits her properly and everything is doing well in that regard and then subsequently she will be discharged. ============ Old Notes: 03/31/16 patient presents today for evaluation concerning open wounds that she has over the left medial ankle region as well as the left dorsal foot. She has previously had this occur although it has been healed for a number of months after having this for about a year prior until her hospitalization on 01/05/16. At that point in time it appears that she was admitted to the hospital for left lower extremity cellulitis and was placed on vancomycin and Zosyn at that point. Eventually upon discharge on January 15, 2016 she was placed on doxycycline at that point in time. Later on 03/27/16 positive wound culture growing Escherichia coli this was switched to amoxicillin. Currently she tells me that she is having pain radiated to be a 7 out of 10 which can be as high as 10 out of 10 with palpation and manipulation of the wound. This wound appears to be mainly venous in nature due to the bilateral lower extremity venous stasis/lymphedema. This is definitely much worse on her left than the right side. She does have type 1 diabetes mellitus, hypertension, morbid obesity, and is wheelchair dependent.during the course of the hospital stay a blood  culture was also obtained and fortunately appeared negative. She also had an x-ray of the  tibia/fibula on the left which showed no acute bone abnormality. Her white blood cell count which was performed last on 03/25/16 was 7.3, hemoglobin 12.8, protein 7.1, albumin 3.0. Her urine culture appeared to be negative for any specific organisms. Patient did have a left lower extremity venous duplex evaluation for DVT . This did not include venous reflux studies but fortunately was negative for DVT Patient also had arterial studies performed which revealed that she had a . normal ABI on the right though this was unable to be performed on the left secondary to pain that she was having around the ankle region due to the wound. However it was stated on report that she had biphasic pulses and apparently good blood flow. ========== 06/03/17 she is here in follow-up evaluation for right lower extremity ulcer. The right lower sure he has healed but she has reopened to the left medial malleolus and dorsal foot with weeping. She is waiting for new compression garments to arrive from home health, the previous compression garments were ill fitting. We will continue with compression bilaterally and follow-up in 2 weeks Readmission: 08/05/17 on evaluation today patient appears to be doing somewhat poorly in regard to her left lower extremity especially although the right lower extremity has a small area which may no longer be open. She has been having a lot of drainage from the left lower extremity however he tells me that she has not been able to use the EXTREMIT-EASE Compression at this point. She states that she did better and was able to actually apply the Juxta-Lite compression although the wound that she has is too large and therefore really does not compress which is why she cannot wear it at this point. She has no one who can help her put it on regular basis her son can sometimes but he's not able to do it most of the time. I do believe that's why she has begun to weave and have issues as she  is currently yet again. No fevers, chills, nausea, or vomiting noted at this time. Patient is no evidence of dementia. 08/12/17 on evaluation today patient appears to still be doing fairly well in regard to the draining areas/weeping areas at this point. With that being said she unfortunately did go to the ER yesterday due to what was felt to be possibly a cellulitis. They place her on doxycycline by mouth and discharge her home. She definitely was not admitted. With that being said she states she has had more discomfort which has been unusual for her even compared to prior times and she's had infections.08/12/17 on evaluation today patient appears to still be doing fairly well in regard to the draining areas/weeping areas at this point. With that being said she unfortunately did go to the ER yesterday due to what was felt to be possibly a cellulitis. They place her on doxycycline by mouth and discharge her home. She definitely was not admitted. With that being said she states she has had more discomfort which has been unusual for her even compared to prior times and she's had infections. 08/19/17 put evaluation today patient tells me that she's been having a lot of what sounds to be neuropathic type pain in regard to her left lower extremity. She has been using over-the-counter topical bins again which some believe. That in order to apply the she actually remove the wrap we put on her  last Wednesday on Thursday. Subsequently she has not had anything on compression wise since that time. The good news is a lot of the weeping areas appear to have closed at this point again I believe she would do better with compression but we are struggling to get her to actually use what she needs to at this point. No fevers, chills, nausea, or vomiting noted at this time. 09/03/17 on evaluation today patient appears to be doing okay in regard to her lower extremities in regard to the lymphedema and weeping. Fortunately she  does not seem to show any signs of infection at this point she does have a little bit of weeping occurring in the right medial malleolus area. With that being said this does not appear to be too significant which is good news. 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 She has severe skin damage secondary to both of these features involving the dorsal left foot and medial left ankle and lower leg. Still has open areas in the left anterior foot. The area on the right closed over. She uses her own juxta light stockings. She does not have an arterial issue 09/16/17 on evaluation today patient actually appears to be doing excellent in regard to her bilateral lower extremity swelling. The Juxta-Lite compression wrap seem to be doing very well for her. She has not however been using the portion that goes over her foot. Her left foot still is draining a little bit not nearly as significant as it has been in the past but still I do believe that she likely needs to utilize the full wrap including the foot portion of this will improve as well. She also has been apparently putting on a significant amount of Vaseline which also think is not helpful for her. I recommended that if she feels she needs something for moisturizer Eucerin will probably be better. 09/30/17 on evaluation today patient presents with several new open areas in regard to her left lower extremity although these appear to be minimal and mainly seem to be more moisture breakdown than anything. Fortunately she does not seem to have any evidence of infection which is great news. She has been tolerating the dressing changes without complication we are using silver alginate on the foot she has been using AB pads to have the legs and using her Juxta- Lite compression which seems to be controlling her swelling very well. Overall I'm pleased with the poor way she has progressed. 10/14/17  on evaluation today patient appears to be doing better in regard to her left lower extremity areas of weeping. She does still have some discomfort although in general this does not appear to be as macerated and I think it is progressing nicely. I do think she still needs to wear the foot portion of her Juxta- Lite in order to get the most benefit from the wrap obviously. She states she understands. Fortunately there does not appear to be evidence of infection at this time which is great news. 10/28/17 on evaluation today patient appears to be doing excellent in regard to her left lower extremity. She has just a couple areas that are still open and seem to be causing any trouble whatsoever. For that reason I think that she is definitely headed in the right direction the spots are very tiny compared to what we have been dealing with in the past. 11/11/17 on evaluation today patient appears to have a right lateral lower  extremity ulcer that has opened since I last saw her. She states this is where the home health nurse that was coming out remove the dressing without wetting the alginate first. Nonetheless I do not know if this is indeed the case or not but more importantly we have not ordered home help to be coming out for her wounds at all. I'm unsure as to why they are coming out and we're gonna have to check on this and get things situated in that regard. With that being said we currently really do not need them to be coming out as the patient has been taking care of her leg herself without complication and no issues. In fact she was doing much better prior to nursing coming out. 11/25/17 on evaluation today patient actually appears to be doing fairly well in regard to her left lower extremity swelling. In fact she has very little area of weeping at this point there's just a small spot on the lateral portion of her right leg that still has me just a little bit more concerned as far as wanting to see  this clear up before I discharge her to caring for this at home. Nonetheless overall she has made excellent progress. 12/09/17 on evaluation today patient appears to be doing rather well in regard to her lower extremity edema. She does have some weeping still in the left lower extremity although the big area we were taking care of two weeks ago actually has closed and she has another area of weeping on the left lower extremity immediately as well is the top of her foot. She does not currently have lymphedema pumps she has been wearing her compression daily on a regular basis as directed. With that being said I think she may benefit from lymphedema pumps. 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 We had a very difficult time getting and keeping this under control. 12/23/17 on evaluation today patient actually appears to be doing a little bit more poorly in regard to her bilateral lower extremities. She has been tolerating the Juxta-Lite compression wraps. Unfortunately she has two new ulcers on the right lower extremity and left lower Trinity ulceration seems to be larger. Obviously this is not good news. She has been tolerating the dressings without complication. 12/30/17 on evaluation today patient actually appears to be doing much better in regard to her bilateral lower extremity edema. She continues to have some issues with ulcerations and in fact there appears to be one spot on each leg where the wrap may have caused a little bit of a blister which is subsequently opened up at this point is given her pain. Fortunately it does not appear to be any evidence of infection which is good news. No fevers chills noted. 01/13/18 on evaluation today patient appears to be doing rather well in regard to her bilateral lower extremities. The dressings did get kind  of stuck as far as the wound beds are concerned but again I think this is mainly due to the fact that she actually seems to be showing signs of healing which is good news. She's not having as much drainage therefore she was having more of the dressing sticking. Nonetheless overall I feel like her swelling is dramatically down compared to previous. 01/20/18 on evaluation today patient unfortunately though she's doing better in most  regards has a large blister on the left anterior lower extremity where she is draining quite significantly. Subsequently this is going to need debridement today in order to see what's underneath and ensure she does not continue to trapping fluid at this location. Nonetheless No fevers, chills, nausea, or vomiting noted at this time. 01/27/18 on evaluation today patient appears to be doing rather well at this point in regard to her right lower extremity there's just a very small area that she still has open at this point. With that being said I do believe that she is tolerating the compression wraps very well in making good progress. Home health is coming out at this point to see her. Her left lower extremity on the lateral portion is actually what still mainly open and causing her some discomfort for the most part 02/10/18 on evaluation today patient actually appears to be doing very well in regard to her right lower extremity were all the ulcers appear to be completely close. In regard to the left lower extremity she does have two areas still open and some leaking from the dorsal surface of her foot but this still seems to be doing much better to me in general. 02/24/18 on evaluation today patient actually appears to be doing much better in regard to her right lower extremity this is still completely healed. Her left lower extremity is also doing much better fortunately she has no evidence of infection. The one area that is gonna require some debridement is still on the  left anterior shin. Fortunately this is not hurting her as badly today. 03/10/18 on evaluation today patient appears to be doing better in some regards although she has a little bit more open area on the dorsal foot and she also has some issues on the medial portion of the left lower extremity which is actually new and somewhat deep. With that being said there fortunately does not appear to be any significant signs of infection which is good news. No fevers, chills, nausea, or vomiting noted at this time. In general her swelling seems to be doing fairly well which is good news. 03/31/18 on evaluation today patient presents for follow-up concerning her left lower extremity lymphedema. Unfortunately she has been doing a little bit more poorly since I last saw her in regard to the amount of weeping that she is experiencing. She's also having some increased pain in the anterior shin location. Unfortunately I do not feel like the patient is making such good progress at this point a few weeks back she was definitely doing much better. 04/07/18 on evaluation today patient actually appears to be showing some signs of improvement as far as the left lower extremity is concerned. She has been tolerating the dressing changes and it does appear that the Drawtex did better for her. With that being said unfortunately home health is stating that they cannot obtain the Drawtex going forward. Nonetheless we're gonna have to check and see what they may be able to get the alginate they were using was getting stuck in causing new areas of skin being pulled all that with and subsequently weep and calls her to worsen overall this is the first time we've seen improvement at this time. 04/14/18 on evaluation today patient actually appears to be doing rather well at this point there does not appear to be any evidence of infection at this time and she is actually doing excellent in regard to the weeping in fact she almost has no  openings remaining even compared to just last week this is a dramatic improvement. No fevers chills noted 04/21/18 evaluation today patient actually appears to be doing very well. She in fact is has a small area on the posterior lower extremity location and she has a small area on the dorsal surface of her foot that are still open both of which are very close to closing. We're hoping this will be close shortly. She brought her Juxta-Lite wrap with her today hoping that would be able to put her in it unfortunately I don't think were quite at that point yet but we're getting closer. 04/28/18 upon evaluation today patient actually appears to be doing excellent in regard to her left lower extremity ulcer. In fact the region on the posterior lower extremity actually is much smaller than previously noted. Overall I'm very happy with the progress she has made. She again did bring her Juxta-Lite although we're not quite ready for that yet. 05/11/18 upon evaluation today patient actually appears to be doing in general fairly well in regard to her left lower Trinity. The swelling is very well controlled. With that being said she has a new area on the left anterior lower extremity as well as between the first and second toes of her left foot that was not present during the last evaluation. The region of her posterior left lower extremity actually appears to be almost completely healed. T be honest I'm very pleased with o the way that stands. Nonetheless I do believe that the lotion may be keeping the area to moist as far as her legs are concerned subsequently I'm gonna consider discontinuing that today. 05/26/18 on evaluation today patient appears to be doing rather well in regard to her left lower should be ulcers. In fact everything appears to be close except for a very small area on the left posterior lower extremity. Fortunately there does not appear to be any evidence of infection at this time. Overall very  pleased with her progress. 06/02/18 and evaluation today patient actually appears to be doing very well in regard to her lower extremity ulcers. She has one small area that still continues to weep that I think may benefit her being able to justify lotion and user Juxta-Lite wraps versus continued to wrap her. Nonetheless I think this is something we can definitely look into at this point. 06/23/18 on evaluation today patient unfortunately has openings of her bilateral lower extremities. In general she seems to be doing much worse than when I last saw her just as far as her overall health standpoint is concerned. She states that her discomfort is mainly due to neuropathy she's not having any other issues otherwise. No fevers, chills, nausea, or vomiting noted at this time. 06/30/18 on evaluation today patient actually appears to be doing a little worse in regard to her right lower extremity her left lower extremity of doing fairly well. Fortunately there is no sign of infection at this time. She has been tolerating the dressing changes without complication. Home health did not come out like they were supposed to for the appropriate wrap changes. They stated that they never received the orders from [place] which were fax. Nonetheless we will send a copy of the orders with the patient today as well. 07/07/18 on evaluation today patient appears to be doing much better in regard to lower extremities. She still has several openings bilaterally although since I last saw her her legs did show obvious signs of infection when she  later saw her nurse. Subsequently a culture was obtained and she is been placed on Bactrim and Keflex. Fortunately things seem to be looking much better it does appear she likely had an infection. Again last week we'd even discussed it but again there really was not any obvious sign that she had infection therefore we held off on the antibiotics. Nonetheless I'm glad she's doing better  today. 07/14/18 on evaluation today patient appears to be doing much better regarding her bilateral lower Trinity's. In fact on the right lower for me there's nothing open at this point there are some dry skin areas at the sites where she had infection. Fortunately there is no evidence of systemic infection which is excellent news. No fevers chills noted 07/21/18 on evaluation today patient actually appears to be doing much better in regard to her left lower extremity ulcers. She is making good progress and overall I feel like she's improving each time I see her. She's having no pain I do feel like the infection is completely resolved which is excellent news. No fevers, chills, nausea, or vomiting noted at this time. 07/28/18 on evaluation today patient appears to be doing very well in regard to her left lower [employer]. Everything seems to be showing signs of improvement which is excellent news. Overall very pleased with the progress that has been made. Fortunately there's no evidence of active infection at this time also excellent news. 08/04/18 on evaluation today patient appears to be doing more poorly in regard to her bilateral lower extremities. She has two new areas open up on the right and these were completely closed as of last week. She still has the two spots on the left which in my pinion seem to be doing better. Fortunately there's no evidence of infection again at this point. 08/11/18 on evaluation today patient actually appears to be doing very well in regard to her bilateral lower Trinity wounds that all seem to be doing better and are measures smaller today. Fortunately there's no signs of infection. No fevers, chills, nausea, or vomiting noted at this time. 08/18/18 on evaluation today patient actually appears to be doing about the same inverter bilateral lower extremities. She continues to have areas that blistering open as was drain that fortunately nothing too significant. Overall  I feel like Drawtex may have done better for her however compared to the collagen. 08/25/18 on evaluation today patient appears to be doing a little bit more poorly today even compared to last time I saw her. Again I'm not exactly sure why she's making worse progress over the past several weeks. I'm beginning to wonder if there is some kind of underlying low level infection causing this issue. I did actually take a culture from the left anterior lower extremity but it was a new wound draining quite a bit at this point. Unfortunately she also seems to be having more pain which is what also makes me worried about the possibility of infection. This is despite never erythema noted at this point. 09/01/18 on evaluation today patient actually appears to be doing a little worse even compared to last week in regard to bilateral lower extremities. She did go to the hospital on the 16th was given a dose of IV Zosyn and then discharged with a recommendation to continue with the Bactrim that I previously prescribed for her. Nonetheless she is still having a lot of discomfort she tells me as well at this time. This is definitely unfortunate. No fevers, chills,  nausea, or vomiting noted at this time. 09/08/18 on evaluation today patient's bilateral lower extremities actually appear to be shown signs of improvement which is good news. Fortunately there does not appear to be any signs of active infection I think the anabiotic is helping in this regard. Overall I'm very pleased with how she is progressing. 09/15/18 patient was actually seen in ER yesterday due to her legs as well unfortunately. She states she's been having a lot of pain and discomfort as well as a lot of drainage. Upon inspection today the patient does have a lot of swelling and drainage I feel like this is more related to lymphedema and poor fluid control than it is to infection based on what I'm seeing. The physician in the emergency department also  doubted that the patient was having a significant infection nonetheless I see no evidence of infection obvious at this point although I do see evidence of poor fluid control. She still not using a compression pumps, she is not elevating due to her lift chair as well as her hospital bed being broken, and she really is not keeping her legs up as much as they should be and also has been taking off her wraps. All this combined I think has led to poor fluid control and to be honest she may be somewhat volume overloaded in general as well. I recommend that she may need to contact your physician to see if a prescription for a diuretic would be beneficial in their opinion. As long as this is safe I think it would likely help her. 09/29/18 on evaluation today patient's left lower extremity actually appears to be doing quite a bit better. At least compared to last time that I saw her. She still has a large area where she is draining from but there's a lot of new skin speckled trout and in fact there's more new skin that there are open areas of weeping and drainage at this point. This is good news. With regard to the right lower extremity this is doing much better with the only open area that I really see being a dry spot on the right lateral ankle currently. Fortunately there's no signs of active infection at this time which is good news. No fevers, chills, nausea, or vomiting noted at this time. The patient seems somewhat stressed and overwhelmed during the visit today she was very lethargic as such. She does and she is not taking any pain medications at this point. Apparently according to her husband are also in the process of moving which is probably taking its toll on her as well. 10/06/18 on evaluation today patient appears to be doing rather well in regard to her lower extremities compared to last evaluation. Fortunately there's no signs of active infection. She tells me she did have an appointment with her  primary. Nonetheless he was concerned that the wounds were somewhat deep based on pictures but we never actually saw her legs. She states that he had her somewhat worried due to the fact that she was fearing now that she was [place] have to have an amputation. With that being said based on what I'm seeing check she looks better this week that she has the last two times I've seen her with much less drainage I'm actually pleased in this regard. That doesn't mean that she's out of the water but again I do not think what the point of talking about education at all in regard to her leg. She  is very happy to hear this. She is also not having as much pain as she was having last week. 10/13/18 unfortunately on evaluation today patient still continues to have a significant amount of drainage she's not letting home health actually apply the compression dressings at this point. She's trying to use of Juxta-Lite of the top of Kerlex and the second layer of the three layer compression wrap. With that being said she just does not seem to be making as good a progress as I would expect if she was having the compression applied and in place on a regular basis. No fevers, chills, nausea, or vomiting noted at this time. 10/20/18 on evaluation today patient appears to be doing a little better in regard to her bilateral lower extremity ulcers. In fact the right lower extremity seems to be healed she doesn't even have any openings at this point left lower extremity though still somewhat macerated seems to be showing signs of new skin growth at multiple locations throughout. Fortunately there's no evidence of active infection at this time. No fevers, chills, nausea, or vomiting noted at this time. 10/27/18 on evaluation today patient appears to be doing much better in regard to her left lower Trinity ulcer. She's been tolerating the laptop complication and has minimal drainage noted at this point. Fortunately there's no signs of  active infection at this time. No fevers, chills, nausea, or vomiting noted at this time. 11/03/18 on evaluation today patient actually appears to be doing excellent in regard to her left lower extremity. She is having very little drainage at this point there does not appear to be any significant signs of infection overall very pleased with how things have gone. She is likewise extremely pleased still and seems to be making wonderful progress week to week. I do believe antibiotics were helpful for her. Her primary care provider did place on amateur clean since I last saw her. 11/17/18 on evaluation today patient appears to be doing worse in regard to her bilateral lower extremities at this point. She is been tolerating the dressing changes without complication. With that being said she typically takes the Coban off fairly quickly upon arriving home even after being seen here in the clinic and does not allow home health reapply command as part of the dressing at home. Therefore she said no compression essentially since I last saw her as best I can tell. With that being said I think it shows and how much swelling she has in the open wounds that are noted at this point. Fortunately there's no signs of infection but unfortunately if she doesn't get this under control I think she will end up with infection and more significant issues. 11/24/18 on evaluation today patient actually appears to be doing somewhat better in regard to her bilateral lower extremities. 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 Nonetheless that is doing better currently so she should be able to attempt reinitiating the lymphedema pumps at this time. No fevers, chills, nausea, or vomiting noted at this time. 12/01/18 upon evaluation today patient's left lower extremity appears to be doing quite  well unfortunately her right lower extremity is not doing nearly as well. She has been tolerating the dressing changes without complication unfortunately she did not keep a wrap on the right at this time.  Nonetheless I believe this has led to increased swelling and weeping in the world is actually much larger than during the last evaluation with her. 12/08/18 on evaluation today patient appears to be doing about the same at this point in regard to her right lower extremity. There is some more palatable to touch I'm concerned about the possibility of there being some infection although I think the main issue is she's not keeping her compression wrap on which in turn is not allowing this area to heal appropriately. 12/22/18 on evaluation today patient appears to be doing better in regard to left lower extremity unfortunately significantly worse in regard to the right lower extremity. The areas of blistering and necrotic superficial tissue have spread and again this does not really appear to be signs of infection and all she just doesn't seem to be doing nearly as well is what she has been in the past. Overall I feel like the Augmentin did absolutely nothing for her she doesn't seem to have any infection again I really didn't think so last time either is more of a potential preventative measure and hoping that this would make some difference but I think the main issue is she's not wearing her compression. She tells me she cannot wear the [place] been we put on she takes it off pretty much upon getting home. Subsequently she worshiped Juxta-Lite when I questioned her about how often she wears it this is no more than three hours a day obviously that leaves 21 hours that she has no compression and this is obviously not doing well for her. Overall I'm concerned that if things continue to worsen she is at great risk of both infection as well as losing her leg. 01/05/19 on evaluation today patient appears to be  doing well in regard to her left lower extremity which he is allowing [place] to wrap and not so well with regard to her right lower extremity which she is not allowing [place] to really wrap and keep the wrap on. She states that it hurts too badly whenever it's wrapped and she ends up having to take it off. She's been using the Juxta-Lite she tells me up to six hours a day although I question whether or not that's really been the case to be honest. Previously she told me three hours today nonetheless obviously the legs as long as the wrap is doing great when she is not is doing much more poorly. 01/12/2019 on evaluation today patient actually appears to be doing a little better in my opinion with regard to her right lower extremity ulcer. She has a small open area on the left lower extremity unfortunately but again this I think is part of the normal fluctuation of what she is going to have to expect with regard to her legs especially when she is not using her lymphedema pumps on a regular basis. Subsequently based on what I am seeing today I think that she does seem to be doing slightly better with regard to her right lower extremity she did see her primary care provider on Monday they felt she had an infection and placed her on 2 antibiotics. Both Cipro and clindamycin. Subsequently again she seems possibly to be doing a little bit better in regards to the right lower extremity she also tells me however she has been wearing the compression wrap over the past week since I spoke with her as well that is a Kerlix and Coban wrap on the right. No  fevers, chills, nausea, vomiting, or diarrhea. 01/19/2019 on evaluation today patient appears to be doing better with regard to her bilateral lower extremities especially the right. I feel like the compression has been beneficial for her which is great news. She did get a call from her primary care provider on her way here today telling her that she did have  methicillin- resistant Staphylococcus aureus and he was calling in a couple new antibiotics for her including a ointment to be applied she tells me 3 times a day. With that being said this sounds like likely to be Bactroban which I think could be applied with each dressing/wrap change but I would not be able to accommodate her applying this 3 times a day. She is in agreement with the least doing this we will add that to her orders today. 01/26/2019 on evaluation today patient actually appears to be doing much better with regard to her right lower extremity. Her left lower extremity is also doing quite well all things considering. Fortunately there is no evidence of active infection at this time. No fevers, chills, nausea, vomiting, or diarrhea. 02/02/2019 on evaluation today patient appears to be doing much better compared to her last evaluation. Little by little off like her right leg is returning more towards normal. There does not appear to be any signs of active infection and overall she seems to be doing quite well which is great news. I am very pleased in this regard. No fevers, chills, nausea, vomiting, or diarrhea. 02/09/2019 upon evaluation today patient appears to be doing better with regard to her bilateral lower extremities. She has been tolerating the dressing changes without complication. Fortunately there is no signs of active infection at this time. No fevers, chills, nausea, vomiting, or diarrhea. 02/23/2019 on evaluation today patient actually appears to be doing quite well with regard to her bilateral lower extremities. She has been tolerating the dressing changes without complication. She is even used her pumps one time and states that she really felt like it felt good. With that being said she seems to be in good spirits and her legs appear to be doing excellent. 03/09/2019 on evaluation today patient appears to be doing well with regard to her right lower extremity there are no open  wounds at this time she is having some discomfort but I feel like this is more neuropathy than anything. With regard to her left lower extremity she had several areas scattered around that she does have some weeping and drainage from but again overall she does not appear to be having any significant issues and no evidence of infection at this time which is good news. 03/23/2019 on evaluation today patient appears to be doing well with regard to her right lower extremity which she tells me is still close she is using her juxta light here. Her left lower extremity she mainly just has an area on the foot which is still slightly draining although this also is doing great. Overall very pleased at this time. 04/06/2019 patient appears to be doing a little bit worse in regard to her left lower extremity upon evaluation today. She feels like this could be becoming infected again which she had issues with previous. Fortunately there is no signs of systemic infection but again this is always a struggle with her with her legs she will go from doing well to not so well in a very short amount of time. 04/20/2019 on evaluation today patient actually appears to be doing quite  well with regard to her right lower extremity I do not see any signs of active infection at this time. Fortunately there is no fever chills noted. She is still taking the antibiotics which I prescribed for her at this point. In regard to the left lower extremity I do feel like some of these areas are better although again she still is having weeping from several locations at this time. 04/27/2019 on evaluation today patient appears to be doing about the same if not slightly worse in regard to her left lower extremity ulcers. She tells me when questioned that she has been sleeping in her Hoveround chair in fact she tells me she falls asleep without even knowing it. I think she is spending a whole lot of time in the chair and less time walking and  moving around which is not good for her legs either. On top of that she is in a seated position which is also the worst position she is not really elevating her legs and she is also not using her lymphedema pumps. All this is good to contribute to worsening of her condition in general. 05/18/2019 on evaluation today patient appears to be doing well with regard to her lower extremity on the right in fact this is showing no signs of any open wounds at this time. On the left she is continuing to have issues with areas that do drain. Some of the regions have healed and there are couple areas that have reopened. She did go to the ER per the patient according to recommendations from the home health nurse due to what she was seen when she came out on 05/13/2019. Subsequently she felt like the patient needed to go to the hospital due to the fact that again she was having "milky white discharge" from her leg. Nonetheless she had and then was placed on doxycycline and subsequently seems to be doing better. 06/01/2019 upon evaluation today patient appears to be doing really in my opinion about the same. I do not see any signs of active infection which is good news. Overall she still has wounds over the bilateral lower extremities she has reopened on the right but this appears to be more of a crack where there is weeping/edema coming from the region. I do not see any evidence of infection at either site based on what I visualized today. 07/13/2019 upon evaluation today patient appears to be doing a little worse compared to last time I saw her. She since has been in the hospital from 06/21/2019 through 06/29/2019. This was secondary to having Covid. During that time they did apply lotion to her legs which unfortunately has caused her to develop a myriad of open wounds on her lower extremities. Her legs do appear to be doing better as far as the overall appearance is concerned but nonetheless she does have more open  and weeping areas. 07/27/2019 upon evaluation today patient appears to be doing more poorly to be honest in regard to her left lower extremity in particular. There is no signs of systemic infection although I do believe she may have local infection. She notes she has been having a lot of blue/green drainage which is consistent potentially with Pseudomonas. That may be something that we need to consider here as well. The doxycycline does not seem to have been helping. 08/03/2019 upon evaluation today patient appears to be doing a little better in my opinion compared to last week's evaluation. Her culture I did review today and  she is on appropriate medications to help treat the Enterobacter that was noted. Overall I feel like that is good news. With that being said she is unfortunately continuing to have a lot of drainage and though it is doing better I still think she has a long ways to go to get things dried up in general. Fortunately there is no signs of systemic infection. 08/10/2019 upon evaluation today patient appears to be doing may be slightly better in regard to her left lower extremity the right lower extremity is doing much better. Fortunately there is no signs of infection right now which is good news. No fevers, chills, nausea, vomiting, or diarrhea. 08/24/2019 on evaluation today patient appears to be doing slightly better in regard to her lower extremities. The left lower extremity seems to be healed the right lower extremity is doing better though not completely healed as far as the openings are concerned. She has some generalized issues here with edema and weeping secondary to her lymphedema though again I do believe this is little bit drier compared to prior weeks evaluations. In general I am very pleased with how things seem to be progressing. No fevers, chills, nausea, vomiting, or diarrhea. 08/31/2019 upon evaluation today patient actually seems to making some progress here with regard  to the left lower extremity in particular. She has been tolerating the dressing changes without complication. Fortunately there is no signs of active infection at this time. No fevers, chills, nausea, vomiting, or diarrhea. She did see Dr. Doren Custard and he did note that she did have a issue with the left great saphenous vein and the small saphenous vein in the leg. With that being said he was concerned about the possibility of laser ablation not being extremely successful. He also mentioned a small risk of DVT associated with the procedure. However if the wounds do not continue to improve he stated that that would probably be the way to go. Fortunately the patient's legs do seem to be doing much better. 09/07/2019 upon evaluation today patient appears to be doing better with regard to her lower extremities. She has been tolerating the dressing changes without complication. With that being said she is showing signs of improvement and overall very pleased. There are some areas on her leg that I think we do need to debride we discussed this last week the patient is in agreement with doing that as long as it does not hurt too badly. 09/14/2019 upon evaluation today patient appears to be doing decently well with regard to her left lower extremity. She is not having near as much weeping as she has had in the past things seem to be drying up which is good news. There is no signs of active infection at this time. 09/21/19 upon evaluation today patient appears to be doing better in regard overall to her bilateral lower extremities. She again has less open than she did previous and each week I feel like this is getting better. Fortunately there is no signs of active infection at this time. No fevers, chills, nausea, vomiting, or diarrhea. 09/28/2019 upon evaluation today patient actually appears to be showing signs of improvement with regard to her left lower extremity. Unfortunately the right medial lower extremity  around the ankle region has reopened to some degree but this appears to be minimal still which is good news. There is no signs of active infection at this time which is also good news. 10/12/2019 upon evaluation today patient appears to be doing okay  with regard to her bilateral lower extremities today. The right is a little bit worse then last evaluation 2 weeks ago. The left is actually doing a little better in my opinion. Overall there is no signs of active infection at this time that I see. Obviously that something we have to keep a close eye on she is very prone to this with the significant and multiple openings that she has over the bilateral lower extremities. 10/19/2019 upon evaluation today patient appears to be doing about the best that I have seen her in quite some time. She has been tolerating the dressing changes without complication. There does not appear to be any signs of active infection and overall I am extremely happy with the way her legs appeared. She is drying up quite nicely and overall is having less pain. 11/09/2019 upon evaluation today patient appears to be doing better in regard to her wounds. She seems to be drying up more and more each time I see her this is just taking a very long time. Fortunately there is no signs of active infection at this time. 11/23/2019 upon evaluation today patient actually appears to be doing excellent in regard to her lower extremities at this point compared to where she has been. Fortunately there is no signs of active infection at this time. She did go to the hospital last week for nausea and vomiting completely unrelated to her wounds. Fortunately she is doing better she was given some Reglan and got better. She had associated abdominal pain but they never found out what was going on. 12/07/2019 upon evaluation today patient appears to be doing well for the most part in regard to her legs. She unfortunately has not been keeping the Coban portion of  her wraps on therefore the compression has not really been sufficient for what it is supposed to be. Nonetheless she tells me that it just hurt too bad therefore she removed it. 12/21/2019 upon evaluation today patient actually appears to be doing quite well with regard to her legs. I do feel like she has been making progress which is great news and overall there is no signs of active infection at this time. No fevers, chills, nausea, vomiting, or diarrhea. 01/04/2020 upon evaluation today patient presents for follow-up concerning her lower extremity edema bilaterally. She still has open wounds she has not been using her lymphedema pumps. She is also not been utilizing her compression wraps appropriately she tends to unwrap them, take them off, or states that they hurt. Obviously the reason they hurt is because her legs start to swell but the issue is if she would use her compression/lymphedema pumps regularly she would not swell and she would have the pain. Nonetheless she has not even picked them up once honestly over the past several months and may be even as much as in the past year based on my opinion and what have seen. She tells me today that after last week when I talked about this with her specifically actually that was 2 weeks ago that she "forgot". 8//21 on evaluation today patient appears to be doing a little better in regard to her legs bilaterally. Fortunately there is no signs of active infection at this time. She tells me that she used her lymphedema pumps all of one time over the past 2 weeks since I last saw her. She tells me that she has been too busy in order to continue to use these. 02/01/2020 on evaluation today patient appears to  be doing some better in regard to her wounds in general in her legs. We felt the right was healed although is not completely it does appear to be doing better she tells me she has been using her lymphedema pumps that she has had this six times since I last  saw her. Obviously the more she does that the better she would do my opinion 02/15/2020 upon evaluation today patient appears to be doing about the same in regard to her legs. She tells me that she is pumping I'm still not sure how much she does to be perfectly honest. However even if she does a little bit here and there I guess that is better than nothing. Fortunately there is no sign of active infection at this time which is great news. No fevers, chills, nausea, vomiting, or diarrhea. 02/29/2020 on evaluation today patient actually appears to be doing quite well all things considered this week. She has been tolerating the dressing changes without complication. Fortunately there is no signs of active infection at this time. No fevers, chills, nausea, vomiting, or diarrhea. 03/14/2020 upon evaluation today patient appears to be doing really about the same in regard to her legs. There is no signs of improvement overall and she as noted from home health does not appear to be elevating her legs he can get into her lift chair. There is too much stuff piled up on it the patient tells me. She also tells me she cannot really use her pumps effectively due to the fact that she cannot have any space to get them on. Finally she is also not really elevating her legs because she is not sleeping in her bed she is sleeping in her chair currently and again overall I think everything that she is done in combination has been exactly the wrong thing for what she needs for her legs. 04/04/2020 upon evaluation today patient appears to be doing well at this time with regard to her legs. She is actually been pumping, keeping her wraps on, and to be honest she seems to be doing dramatically better the right leg is excellent the left leg is also excellent and measuring much smaller than previous. 04/18/2020 upon evaluation today patient actually is continue to make good progress in regard to her lower extremities bilaterally.  Everything is improving and less wet that has been in the past overall I am extremely pleased with where things stand and I think that she is making great progress. The patient tells me she still continue to use her compression pumps 05/02/2020 on evaluation today patient appears to be doing well at this time in regard to her left leg which is showing signs of drying up. With that being said she does have a lot of lymphedema type crusty skin around the toes of her left foot and the right medial ankle which has opened at this point. Fortunately there is no signs of active infection systemically at this point or even locally for that matter. 05/23/2020 on evaluation today patient appears to be doing well with regard to her lower extremities. Fortunately there is no signs of active infection at this time. No fever chills noted. She has been very depressed however she tells me. 06/06/2020 patient came in today for evaluation in regard to her bilateral lower extremity ulcerations. With that being said she came in feeling okay and actually laughing and joking around with the staff checking her in. Subsequently however she had a coughing spell and following the  coughing spell it was a dramatic conversion from being jovial and joking around to being extremely short of breath her vital signs actually dropped in regard to her blood pressure from around 175 to down around 125 for systolic and from around 96 diastolic down to around 70. With that being said she also accompanied this with an increase in her respiratory rate which was also quite significant. Nonetheless I actually upon going into see her was extremely worried we called EMS to have her transported to the ER for further evaluation and treatment. She continued until EMS got here to be extremely short of breath even on 6 L of oxygen. Her oxygen saturation did come up to 99% but overall it was only 95 before which was not terrible she did feel like the  oxygen helped her feel somewhat better however. She has a history of asthma but again even the coughing spell really should not have done this degree of alteration in her demeanor from where she was just before to after the coughing spell. She tells [place] however she has been feeling somewhat abnormal since Friday although she could not really pinpoint exactly what was going on. 06/27/2020 plan evaluation today patient appears to be doing okay in regard to her leg ulcers. She is really not showing a lot of improvement to be honest and she still has a lot of weeping. With that being said after I last saw her we called EMS to take her to the hospital she actually did not go she went to see her primary care provider who according to the patient have not identified and read through the note states that everything checked out okay. Nonetheless the patient has continued to have bouts where she gets very short of breath for seemingly no reason whatsoever. That happened even the same night that I saw her last time. This was after she refused transport to the hospital. With that being said she also tells me that just getting dressed to come here is quite a chore which is putting on her shirt causing her to have to stop to catch her breath. Obviously this is not normal and I feel like there is something going on. She may need a referral to be seen by cardiology ASAP. She does see her primary care provider early next week she tells me 07/11/2020 upon evaluation today patient's wounds again appear to be doing about the same she mainly has weeping of the bilateral lower extremities both are very swollen today she tells me she has been using her pumps but last time I saw her she told me she did not even have her pumps out that she had "packed them away because they had too much stuff in their house. Apparently her husband has been bringing stuff in from storage units. She then subsequently told me that she had so much  stuff in her house that she has been staying up all night and did not go to bed till 7:00 this morning because she was cleaning up stuff. Nonetheless I am still unsure as to whether or not she is using the pumps based on what I am seeing I would think probably not. Nonetheless she has been keeping the compression wraps in place that is at least something Objective Constitutional Well-nourished and well-hydrated in no acute distress. Vitals Time Taken: 11:24 AM, Height: 62 in, Weight: 335 lbs, BMI: 61.3, Temperature: 97.8 F, Pulse: 118 bpm, Respiratory Rate: 17 breaths/min, Blood Pressure: 174/81 mmHg, Capillary Blood  Glucose: 202 mg/dl. Respiratory normal breathing without difficulty. Psychiatric this patient is able to make decisions and demonstrates good insight into disease process. Alert and Oriented x 3. pleasant and cooperative. General Notes: Upon inspection patient's wound bed actually showed signs of good granulation at this time. There does not appear to be any signs of active infection which is great news and overall I am pleased with where things stand. No fevers, chills, nausea, vomiting, or diarrhea. Unfortunately she is significantly swollen on both lower extremities but really especially the right. Integumentary (Hair, Skin) Wound #61 status is Open. Original cause of wound was Gradually Appeared. The wound is located on the Left,Circumferential Lower Leg. The wound measures 13cm length x 28cm width x 0.1cm depth; 285.885cm^2 area and 28.588cm^3 volume. There is Fat Layer (Subcutaneous Tissue) exposed. There is no undermining noted. There is a medium amount of serous drainage noted. The wound margin is flat and intact. There is large (67-100%) pink, pale granulation within the wound bed. There is no necrotic tissue within the wound bed. General Notes: converted #68 left anterior lower leg wound to this wound. Wound #64 status is Open. Original cause of wound was Gradually  Appeared. The wound is located on the Left,Dorsal Foot. The wound measures 5.5cm length x 6.5cm width x 0.1cm depth; 28.078cm^2 area and 2.808cm^3 volume. The wound is limited to skin breakdown. There is no tunneling or undermining noted. There is a medium amount of serosanguineous drainage noted. The wound margin is flat and intact. There is large (67-100%) pink, pale granulation within the wound bed. There is no necrotic tissue within the wound bed. Wound #67 status is Open. Original cause of wound was Gradually Appeared. The wound is located on the Right,Medial Lower Leg. The wound measures 3.1cm length x 5cm width x 0.1cm depth; 12.174cm^2 area and 1.217cm^3 volume. There is Fat Layer (Subcutaneous Tissue) exposed. There is no tunneling or undermining noted. There is a medium amount of serous drainage noted. The wound margin is thickened. There is large (67-100%) red granulation within the wound bed. There is no necrotic tissue within the wound bed. Wound #68 status is Converted. Original cause of wound was Gradually Appeared. The wound is located on the Left,Anterior Lower Leg. The wound measures 13cm length x 28cm width x 0.1cm depth; 285.885cm^2 area and 28.588cm^3 volume. 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 There was a Three Layer Compression Therapy Procedure by Fonnie Mu, RN. Post procedure Diagnosis Wound #61: Same as Pre-Procedure Wound #67 Pre-procedure diagnosis of Wound #67 is a Diabetic Wound/Ulcer of the Lower Extremity located on the Right,Medial Lower Leg . There was a Three Layer Compression Therapy Procedure by Fonnie Mu, RN. Post procedure Diagnosis Wound #67: Same as Pre-Procedure Plan Follow-up Appointments: Return Appointment in 2 weeks. Bathing/ Shower/ Hygiene: May shower and wash wound with soap and water. -  with dressing changes, wash both legs with wash cloth and soap and water with dressing changes Edema Control - Lymphedema / SCD / Other: Lymphedema Pumps. Use Lymphedema pumps on leg(s) 2-3 times a day for 45-60 minutes. If wearing any wraps or hose, do not remove them. Continue exercising as instructed. Elevate legs to the level of the heart or above for 30 minutes daily and/or when sitting, a frequency of: Avoid standing for long periods of time. Exercise regularly Home Health: No change in wound care orders this week; continue Home Health for wound care. May utilize formulary equivalent dressing for wound treatment orders unless otherwise specified. Other Home Health Orders/Instructions: - [facility] WOUND #61: - Lower Leg Wound Laterality: Left, Circumferential Peri-Wound Care: Zinc Oxide Ointment 30g tube 2 x Per Week/30 Days Discharge Instructions: Apply Zinc Oxiide to weeping areas with each dressing change Peri-Wound Care: Sween Lotion (Moisturizing lotion) (Home Health) 2 x Per Week/30 Days Discharge Instructions: Apply moisturizing lotion as directed Prim Dressing: KerraCel Ag Gelling Fiber Dressing, 4x5 in (silver alginate) (Home Health) 2 x Per Week/30 Days ary Discharge Instructions: Apply silver alginate to wound bed as instructed Secondary Dressing: Woven Gauze  Sponge, Non-Sterile 4x4 in (Home Health) 2 x Per Week/30 Days Discharge Instructions: Apply over primary dressing as directed. Secondary Dressing: ABD Pad, 5x9 (Home Health) 2 x Per Week/30 Days Discharge Instructions: Apply over primary dressing as directed. Com pression Wrap: ThreePress (3 layer compression wrap) (Home Health) 2 x Per Week/30 Days Discharge Instructions: Apply three layer compression as directed. Pad bend of ankle with foam or ABD pad. WOUND #64: - Foot Wound Laterality: Dorsal, Left Peri-Wound Care: Zinc Oxide Ointment 30g tube 2 x Per Week/30 Days Discharge Instructions: Apply Zinc Oxiide to weeping areas with each dressing change Peri-Wound Care: Sween Lotion (Moisturizing lotion) (Home Health) 2 x Per Week/30 Days Discharge Instructions: Apply moisturizing lotion as directed Prim Dressing: KerraCel Ag Gelling Fiber Dressing, 4x5 in (silver alginate) (Home Health) 2 x Per Week/30 Days ary Discharge Instructions: Apply silver alginate to wound bed as instructed Secondary Dressing: Woven Gauze Sponge, Non-Sterile 4x4 in (Home Health) 2 x Per Week/30 Days Discharge Instructions: Apply over primary dressing as directed. Secondary Dressing: ABD Pad, 5x9 (Home Health) 2 x Per Week/30 Days Discharge Instructions: Apply over primary dressing as directed. Com pression Wrap: ThreePress (3 layer compression wrap) (Home Health) 2 x Per Week/30 Days Discharge Instructions: Apply three layer compression as directed. Pad bend of ankle with foam or ABD pad. WOUND #67: - Lower Leg Wound Laterality: Right, Medial Peri-Wound Care: Zinc Oxide Ointment 30g tube 2 x Per Week/30 Days Discharge Instructions: Apply Zinc Oxiide to weeping areas with each dressing change Peri-Wound Care: Sween Lotion (Moisturizing lotion) (Home Health) 2 x Per Week/30 Days Discharge Instructions: Apply moisturizing lotion as directed Prim Dressing: KerraCel Ag Gelling Fiber Dressing, 4x5 in (silver alginate)  (Home Health) 2 x Per Week/30 Days ary Discharge Instructions: Apply silver alginate to wound bed as instructed Secondary Dressing: Woven Gauze Sponge, Non-Sterile 4x4 in (Home Health) 2 x Per Week/30 Days Discharge Instructions: Apply over primary dressing as directed. Secondary Dressing: ABD Pad, 5x9 (Home Health) 2 x Per Week/30 Days Discharge Instructions: Apply over primary dressing as directed. Com pression Wrap: ThreePress (3 layer compression wrap) (Home Health) 2 x Per Week/30 Days Discharge Instructions: Apply three layer compression as directed. Pad bend of ankle with foam or ABD pad. 1. I would recommend currently that we have the patient go ahead and  continue with the wound care measures as before. This includes the use of zinc over the good skin to help with anything that is weeping so does not continue to break this down. Subsequently will use silver alginate over any areas that are actually open. Following this we use a 3 layer compression wrap. 2. With regard to her elevation she needs to be elevating her legs, using her compression pumps, and subsequently she should also be trying to avoid sitting with her feet on the floor. Nonetheless I am not sure how much of this she is doing she tells me she has been very busy with unpacking stuff that her husband brought in from the storage unit that he will not help her with. We will see patient back for reevaluation in 1 week here in the clinic. If anything worsens or changes patient will contact our office for additional recommendations. Electronic Signature(s) Signed: 07/11/2020 2:28:48 PM By: [name] PA-C Entered By: [name] on 07/11/2020 14:28:48 -------------------------------------------------------------------------------- SuperBill Details Patient Name: Date of Service: [name] 07/11/2020 Medical Record Number: [id number] Patient Account Number: [id number] Date of Birth/Sex: Treating RN: 1950/10/14  (69 y.o. [name] Primary Care Provider: [name] Other Clinician: Referring Provider: Treating Provider/Extender: Darlen Round in Treatment: 153 Diagnosis Coding ICD-10 Codes Code Description E11.622 Type 2 diabetes mellitus with other skin ulcer I89.0 Lymphedema, not elsewhere classified I87.331 Chronic venous hypertension (idiopathic) with ulcer and inflammation of right lower extremity I87.332 Chronic venous hypertension (idiopathic) with ulcer and inflammation of left lower extremity L97.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 leg with fat layer exposed L97.822 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 I10 Essential (primary) hypertension E66.01 Morbid (severe) obesity due to excess calories F41.8 Other specified anxiety disorders R53.1 Weakness Facility Procedures The patient participates with Medicare or their insurance follows the Medicare Facility Guidelines: CPT4 Description Modifier Quantity Code [id number] BILATERAL: Application of multi-layer venous compression system; leg (below knee), including ankle and 1 foot. Physician Procedures : CPT4 Code Description Modifier [id number] - WC PHYS LEVEL 3 - EST PT ICD-10 Diagnosis Description E11.622 Type 2 diabetes mellitus with other skin ulcer I89.0 Lymphedema, not elsewhere classified I87.331 Chronic venous hypertension (idiopathic) with  ulcer and inflammation of right lower extremity I87.332 Chronic venous hypertension (idiopathic) with ulcer and inflammation of left lower extremity Quantity: 1 Electronic Signature(s) Signed: 07/11/2020 2:29:03 PM By: [name] PA-C Entered By: [name] on 07/11/2020 14:29:01

## 2020-07-12 NOTE — Progress Notes (Signed)
[name], [name] ([id number]) Visit Report for 07/11/2020 Arrival Information Details Patient Name: Date of Service: [name], [name] 07/11/2020 11:00 A M Medical Record Number: [id number] Patient Account Number: [id number] Date of Birth/Sex: Treating RN: [birth year] (70 y.o. Martyn Malay, Linda Primary Care : [name] Other Clinician: Referring : Treating /Extender: Darlen Round in Treatment: 19 Visit Information History Since Last Visit Added or deleted any medications: No Patient Arrived: Wheel Chair Any new allergies or adverse reactions: No Arrival Time: 11:23 Had a fall or experienced change in No Accompanied By: self activities of daily living that may affect Transfer Assistance: None risk of falls: Patient Identification Verified: Yes Signs or symptoms of abuse/neglect since last visito No Secondary Verification Process Completed: Yes Hospitalized since last visit: No Patient Requires Transmission-Based Precautions: No Implantable device outside of the clinic excluding No Patient Has Alerts: Yes cellular tissue based products placed in the center Patient Alerts: R ABI= 1.01 since last visit: L ABI = .99 Has Dressing in Place as Prescribed: Yes Pain Present Now: Yes Electronic Signature(s) Signed: 07/12/2020 3:17:01 PM By: Sandre Kitty Entered By: Sandre Kitty on 07/11/2020 11:24:07 -------------------------------------------------------------------------------- Compression Therapy Details Patient Name: Date of Service: [name]. 07/11/2020 11:00 A M Medical Record Number: [id number] Patient Account Number: [id number] Date of Birth/Sex: Treating RN: 1950-11-08 (70 y.o. [name] Primary Care : [name] Other Clinician: Referring : Treating /Extender: Darlen Round in Treatment: 153 Compression Therapy Performed for Wound Assessment: Wound #61  Left,Circumferential Lower Leg Performed By: Clinician Rhae Hammock, RN Compression Type: Three Layer Post Procedure Diagnosis Same as Pre-procedure Electronic Signature(s) Signed: 07/11/2020 6:09:51 PM By: [name] RN, BSN Entered By: [name] on 07/11/2020 12:07:12 -------------------------------------------------------------------------------- Compression Therapy Details Patient Name: Date of Service: [name]. 07/11/2020 11:00 A M Medical Record Number: [id number] Patient Account Number: [id number] Date of Birth/Sex: Treating RN: Jan 25, 1951 (70 y.o. [name] Primary Care : [name] Other Clinician: Referring : Treating /Extender: Darlen Round in Treatment: 153 Compression Therapy Performed for Wound Assessment: Wound #67 Right,Medial Lower Leg Performed By: Clinician Rhae Hammock, RN Compression Type: Three Layer Post Procedure Diagnosis Same as Pre-procedure Electronic Signature(s) Signed: 07/11/2020 6:09:51 PM By: [name] RN, BSN Entered By: [name] on 07/11/2020 12:07:12 -------------------------------------------------------------------------------- Lower Extremity Assessment Details Patient Name: Date of Service: [name]. 07/11/2020 11:00 A M Medical Record Number: [id number] Patient Account Number: [id number] Date of Birth/Sex: Treating RN: 1950-10-23 (70 y.o. [name], [name] Primary Care : [name] Other Clinician: Referring : Treating /Extender: Darlen Round in Treatment: 153 Edema Assessment Assessed: [name]: Yes] [name]: Yes] Edema: [Left: Yes] [Right: Yes] Calf Left: Right: Point of Measurement: 37 cm From Medial Instep 42 cm 40 cm Ankle Left: Right: Point of Measurement: 10 cm From Medial Instep 30 cm 23 cm Electronic Signature(s) Signed: 07/11/2020 6:11:14 PM By: Deon Pilling Entered By:  Deon Pilling on 07/11/2020 11:57:55 -------------------------------------------------------------------------------- [place] Details Patient Name: Date of Service: [name]. 07/11/2020 11:00 A M Medical Record Number: [id number] Patient Account Number: [id number] Date of Birth/Sex: Treating RN: September 25, 1950 (70 y.o. [name] Primary Care : [name] Other Clinician: Referring : Treating /Extender: Darlen Round in Treatment: [phone] Active Inactive Venous Leg Ulcer Nursing Diagnoses: Actual venous Insuffiency (use after diagnosis is confirmed) Knowledge deficit related to disease process and management Goals: Patient will maintain optimal edema  control Date Initiated: 08/12/2017 Target Resolution Date: 07/25/2020 Goal Status: Active Patient/caregiver will verbalize understanding of disease process and disease management Date Initiated: 08/12/2017 Date Inactivated: 04/21/2018 Target Resolution Date: 04/24/2018 Goal Status: Met Interventions: Assess peripheral edema status every visit. Compression as ordered Treatment Activities: Therapeutic compression applied : 08/12/2017 Notes: Wound/Skin Impairment Nursing Diagnoses: Impaired tissue integrity Knowledge deficit related to ulceration/compromised skin integrity Goals: Patient/caregiver will verbalize understanding of skin care regimen Date Initiated: 08/12/2017 Target Resolution Date: 07/25/2020 Goal Status: Active Ulcer/skin breakdown will have a volume reduction of 30% by week 4 Date Initiated: 08/05/2017 Date Inactivated: 09/30/2017 Target Resolution Date: 10/03/2017 Goal Status: Met Ulcer/skin breakdown will have a volume reduction of 50% by week 8 Date Initiated: 09/30/2017 Date Inactivated: 10/28/2017 Target Resolution Date: 10/28/2017 Goal Status: Met Interventions: Assess patient/caregiver ability to perform ulcer/skin care regimen upon  admission and as needed Assess ulceration(s) every visit Provide education on ulcer and skin care Screen for HBO Treatment Activities: Patient referred to home care : 08/05/2017 Skin care regimen initiated : 08/05/2017 Topical wound management initiated : 08/05/2017 Notes: Electronic Signature(s) Signed: 07/11/2020 6:09:51 PM By: [name] RN, BSN Entered By: [name] on 07/11/2020 12:05:45 -------------------------------------------------------------------------------- Pain Assessment Details Patient Name: Date of Service: [name]. 07/11/2020 11:00 A M Medical Record Number: [id number] Patient Account Number: [id number] Date of Birth/Sex: Treating RN: 05-19-1951 (70 y.o. [name] Primary Care Jovonni Borquez: [name] Other Clinician: Referring [name]: Treating [name]/Extender: Darlen Round in Treatment: [phone] Active Problems Location of Pain Severity and Description of Pain Patient Has Paino Yes Site Locations Rate the pain. Current Pain Level: 4 Pain Management and Medication Current Pain Management: Electronic Signature(s) Signed: 07/11/2020 6:09:51 PM By: [name] RN, BSN Signed: 07/12/2020 3:17:01 PM By: Sandre Kitty Entered By: Sandre Kitty on 07/11/2020 11:24:56 -------------------------------------------------------------------------------- Patient/Caregiver Education Details Patient Name: Date of Service: [name], [name] 1/26/2022andnbsp11:00 A M Medical Record Number: [id number] Patient Account Number: [id number] Date of Birth/Gender: Treating RN: [birth year] (70 y.o. [name] Primary Care Physician: [name] Other Clinician: Referring Physician: Treating Physician/Extender: Darlen Round in Treatment: [phone] Education Assessment Education Provided To: Patient Education Topics Provided Venous: Methods: Explain/Verbal Responses: Reinforcements needed, State  content correctly Wound/Skin Impairment: Methods: Explain/Verbal Responses: Reinforcements needed, State content correctly Electronic Signature(s) Signed: 07/11/2020 6:09:51 PM By: [name] RN, BSN Entered By: [name] on 07/11/2020 12:06:11 -------------------------------------------------------------------------------- Wound Assessment Details Patient Name: Date of Service: [name]. 07/11/2020 11:00 A M Medical Record Number: [id number] Patient Account Number: [id number] Date of Birth/Sex: Treating RN: [birth year] (70 y.o. [name] Primary Care Aquita Simmering: [name] Other Clinician: Referring Aadya Kindler: Treating [name]/Extender: Darlen Round in Treatment: 153 Wound Status Wound Number: 61 Primary Venous Leg Ulcer Etiology: Wound Location: Left, Circumferential Lower Leg Wound Open Wounding Event: Gradually Appeared Status: Date Acquired: 04/20/2019 Comorbid Asthma, Hypertension, Peripheral Arterial Disease, Peripheral Weeks Of Treatment: 64 History: Venous Disease, Type II Diabetes, Gout, Osteoarthritis Clustered Wound: Yes Photos Photo Uploaded By: Mikeal Hawthorne on 07/12/2020 14:00:51 Wound Measurements Length: (cm) 13 Width: (cm) 28 Depth: (cm) 0.1 Clustered Quantity: 2 Area: (cm) 285.885 Volume: (cm) 28.588 % Reduction in Area: -3691.6% % Reduction in Volume: -3691.5% Epithelialization: Large (67-100%) Undermining: No Wound Description Classification: Full Thickness Without Exposed Support Structures Wound Margin: Flat and Intact Exudate Amount: Medium Exudate Type: Serous Exudate Color: amber Foul Odor After Cleansing: No Slough/Fibrino Yes Wound Bed Granulation Amount: Large (67-100%) Exposed Structure  Granulation Quality: Pink, Pale Fascia Exposed: No Necrotic Amount: None Present (0%) Fat Layer (Subcutaneous Tissue) Exposed: Yes Tendon Exposed: No Muscle Exposed: No Joint Exposed: No Bone  Exposed: No Assessment Notes converted #68 left anterior lower leg wound to this wound. Electronic Signature(s) Signed: 07/11/2020 6:09:51 PM By: [name] RN, BSN Signed: 07/11/2020 6:11:14 PM By: Deon Pilling Entered By: Deon Pilling on 07/11/2020 12:01:26 -------------------------------------------------------------------------------- Wound Assessment Details Patient Name: Date of Service: [name]. 07/11/2020 11:00 A M Medical Record Number: [id number] Patient Account Number: [id number] Date of Birth/Sex: Treating RN: 1950-10-01 (70 y.o. [name] Primary Care Godfrey Tritschler: [name] Other Clinician: Referring Lashan Gluth: Treating [name]/Extender: Darlen Round in Treatment: 153 Wound Status Wound Number: 64 Primary Diabetic Wound/Ulcer of the Lower Extremity Etiology: Wound Location: Left, Dorsal Foot Wound Open Wounding Event: Gradually Appeared Status: Date Acquired: 06/01/2019 Comorbid Asthma, Hypertension, Peripheral Arterial Disease, Peripheral Weeks Of Treatment: 58 History: Venous Disease, Type II Diabetes, Gout, Osteoarthritis Clustered Wound: No Photos Photo Uploaded By: Mikeal Hawthorne on 07/12/2020 13:52:08 Wound Measurements Length: (cm) 5.5 Width: (cm) 6.5 Depth: (cm) 0.1 Area: (cm) 28.078 Volume: (cm) 2.808 % Reduction in Area: -16917% % Reduction in Volume: -5630.6% Epithelialization: Large (67-100%) Tunneling: No Undermining: No Wound Description Classification: Grade 1 Wound Margin: Flat and Intact Exudate Amount: Medium Exudate Type: Serosanguineous Exudate Color: red, brown Foul Odor After Cleansing: No Slough/Fibrino Yes Wound Bed Granulation Amount: Large (67-100%) Exposed Structure Granulation Quality: Pink, Pale Fascia Exposed: No Necrotic Amount: None Present (0%) Fat Layer (Subcutaneous Tissue) Exposed: No Tendon Exposed: No Muscle Exposed: No Joint Exposed: No Bone Exposed:  No Limited to Skin Breakdown Electronic Signature(s) Signed: 07/11/2020 6:09:51 PM By: [name] RN, BSN Signed: 07/11/2020 6:11:14 PM By: Deon Pilling Entered By: Deon Pilling on 07/11/2020 12:03:33 -------------------------------------------------------------------------------- Wound Assessment Details Patient Name: Date of Service: [name]. 07/11/2020 11:00 A M Medical Record Number: [id number] Patient Account Number: [id number] Date of Birth/Sex: Treating RN: [birth year] (70 y.o. [name] Primary Care Ayme Short: [name] Other Clinician: Referring Taygan Connell: Treating [name]/Extender: Darlen Round in Treatment: 153 Wound Status Wound Number: 67 Primary Diabetic Wound/Ulcer of the Lower Extremity Etiology: Wound Location: Right, Medial Lower Leg Wound Open Wounding Event: Gradually Appeared Status: Date Acquired: 05/02/2020 Comorbid Asthma, Hypertension, Peripheral Arterial Disease, Peripheral Weeks Of Treatment: 10 History: Venous Disease, Type II Diabetes, Gout, Osteoarthritis Clustered Wound: No Photos Photo Uploaded By: Mikeal Hawthorne on 07/12/2020 13:52:08 Wound Measurements Length: (cm) 3.1 Width: (cm) 5 Depth: (cm) 0.1 Area: (cm) 12.174 Volume: (cm) 1.217 % Reduction in Area: -761% % Reduction in Volume: -763.1% Epithelialization: Medium (34-66%) Tunneling: No Undermining: No Wound Description Classification: Grade 1 Wound Margin: Thickened Exudate Amount: Medium Exudate Type: Serous Exudate Color: amber Foul Odor After Cleansing: No Slough/Fibrino No Wound Bed Granulation Amount: Large (67-100%) Exposed Structure Granulation Quality: Red Fascia Exposed: No Necrotic Amount: None Present (0%) Fat Layer (Subcutaneous Tissue) Exposed: Yes Tendon Exposed: No Muscle Exposed: No Joint Exposed: No Bone Exposed: No Electronic Signature(s) Signed: 07/11/2020 6:09:51 PM By: [name] RN, BSN Signed:  07/11/2020 6:11:14 PM By: Deon Pilling Entered By: Deon Pilling on 07/11/2020 12:03:54 -------------------------------------------------------------------------------- Wound Assessment Details Patient Name: Date of Service: [name]. 07/11/2020 11:00 A M Medical Record Number: [id number] Patient Account Number: [id number] Date of Birth/Sex: Treating RN: [birth year] (71 y.o. [name], [name] Primary Care Blaze Sandin: [name] Other Clinician: Referring Subhan Hoopes: Treating [name]/Extender: Darlen Round in  Treatment: 153 Wound Status Wound Number: 68 Primary Etiology: Diabetic Wound/Ulcer of the Lower Extremity Wound Location: Left, Anterior Lower Leg Wound Status: Converted Wounding Event: Gradually Appeared Date Acquired: 05/18/2020 Weeks Of Treatment: 7 Clustered Wound: No Wound Measurements Length: (cm) 13 Width: (cm) 28 Depth: (cm) 0.1 Area: (cm) 285.885 Volume: (cm) 28.588 % Reduction in Area: -4450.1% % Reduction in Volume: -2174.3% Wound Description Classification: Grade 2 Electronic Signature(s) Signed: 07/11/2020 6:11:14 PM By: Deon Pilling Entered By: Deon Pilling on 07/11/2020 11:59:21 -------------------------------------------------------------------------------- Vitals Details Patient Name: Date of Service: [name]. 07/11/2020 11:00 A M Medical Record Number: [id number] Patient Account Number: [id number] Date of Birth/Sex: Treating RN: January 25, 1951 (70 y.o. [name] Primary Care : [name] Other Clinician: Referring : Treating /Extender: Darlen Round in Treatment: 153 Vital Signs Time Taken: 11:24 Temperature (F): 97.8 Height (in): 62 Pulse (bpm): 118 Weight (lbs): 335 Respiratory Rate (breaths/min): 17 Body Mass Index (BMI): 61.3 Blood Pressure (mmHg): 174/81 Capillary Blood Glucose (mg/dl): 202 Reference Range: 80 - 120 mg / dl Electronic  Signature(s) Signed: 07/12/2020 3:17:01 PM By: Sandre Kitty Entered By: Sandre Kitty on 07/11/2020 11:24:39

## 2020-07-25 ENCOUNTER — Other Ambulatory Visit: Payer: Self-pay

## 2020-07-25 ENCOUNTER — Encounter (HOSPITAL_BASED_OUTPATIENT_CLINIC_OR_DEPARTMENT_OTHER): Payer: Medicare PPO | Attending: Physician Assistant | Admitting: Physician Assistant

## 2020-07-25 DIAGNOSIS — I87332 Chronic venous hypertension (idiopathic) with ulcer and inflammation of left lower extremity: Secondary | ICD-10-CM | POA: Insufficient documentation

## 2020-07-25 DIAGNOSIS — L97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 leg with fat layer exposed: Secondary | ICD-10-CM | POA: Insufficient documentation

## 2020-07-25 DIAGNOSIS — R531 Weakness: Secondary | ICD-10-CM | POA: Diagnosis not present

## 2020-07-25 DIAGNOSIS — I10 Essential (primary) hypertension: Secondary | ICD-10-CM | POA: Diagnosis not present

## 2020-07-25 DIAGNOSIS — L97822 Non-pressure chronic ulcer of other part of left lower leg with fat layer exposed: Secondary | ICD-10-CM | POA: Diagnosis not present

## 2020-07-25 DIAGNOSIS — F418 Other specified anxiety disorders: Secondary | ICD-10-CM | POA: Diagnosis not present

## 2020-07-25 DIAGNOSIS — Z6841 Body Mass Index (BMI) 40.0 and over, adult: Secondary | ICD-10-CM | POA: Diagnosis not present

## 2020-07-25 DIAGNOSIS — E11622 Type 2 diabetes mellitus with other skin ulcer: Secondary | ICD-10-CM | POA: Diagnosis not present

## 2020-07-25 DIAGNOSIS — I87331 Chronic venous hypertension (idiopathic) with ulcer and inflammation of right lower extremity: Secondary | ICD-10-CM | POA: Insufficient documentation

## 2020-07-25 DIAGNOSIS — L97522 Non-pressure chronic ulcer of other part of left foot with fat layer exposed: Secondary | ICD-10-CM | POA: Insufficient documentation

## 2020-07-25 DIAGNOSIS — Z8673 Personal history of transient ischemic attack (TIA), and cerebral infarction without residual deficits: Secondary | ICD-10-CM | POA: Insufficient documentation

## 2020-07-25 DIAGNOSIS — I890 Lymphedema, not elsewhere classified: Secondary | ICD-10-CM | POA: Diagnosis not present

## 2020-07-25 NOTE — Progress Notes (Addendum)
[name], [name] ([id number]) Visit Report for 07/25/2020 Chief Complaint Document Details Patient Name: Date of Service: [name], [name] 07/25/2020 11:00 A M Medical Record Number: [id number] Patient Account Number: [id number] Date of Birth/Sex: Treating RN: 03-27-1951 (70 y.o. [name] Primary Care Provider: [name] Other Clinician: Referring Provider: Treating Provider/Extender: Darlen Round in Treatment: 28 Information Obtained from: Patient Chief Complaint Bilateral reoccurring LE ulcers Electronic Signature(s) Signed: 07/25/2020 11:32:48 AM By: [name] PA-C Entered By: [name] on 07/25/2020 11:32:48 -------------------------------------------------------------------------------- HPI Details Patient Name: Date of Service: [name]. 07/25/2020 11:00 A M Medical Record Number: [id number] Patient Account Number: [id number] Date of Birth/Sex: Treating RN: 09-Mar-1951 (70 y.o. [name] Primary Care Provider: [name] Other Clinician: Referring Provider: Treating Provider/Extender: Darlen Round in Treatment: 155 History of Present Illness HPI Description: this patient has been seen a couple of times before and returns with recurrent problems to her right and left lower extremity with swelling and weeping ulcerations due to not wearing her compression stockings which she had been advised to do during her last discharge, at the end of June 2018. During her last visit the patient had had normal arterial blood flow and her venous reflux study did not necessitate any surgical intervention. She was recommended compression and elevation and wound care. After prolonged treatment the patient was completely healed but she has been noncompliant with wearing or compressions.. She was here last week with an outpatient return visit planned but the patient came in a very poor general condition with altered  mental status and was rushed to the ER on my request. With a history of hypertension, diabetes, TIA and right-sided weakness she was set up for an MRI on her brain and cervical spine and was sent to [facility]. Getting an MRI done was very difficult but once the workup was done she was found not to have any spinal stenosis, epidural abscess or hematoma or discitis. This was radiculopathy to be treated as an outpatient and she was given a follow-up appointment. Today she is feeling much better alert and oriented and has come to reevaluate her bilateral lower extremity lymphedema and ulceration 03/25/2017 -- she was admitted to the hospital on 03/16/2017 and discharged on 03/18/2017 with left leg cellulitis and ulceration. She was started on vancomycin and Zosyn and x-ray showed no bony involvement. She was treated for a cellulitis with IV antibiotics changed to Rocephin and Flagyl and was discharged on oral Keflex and doxycycline to complete a 7 day course. Last hemoglobin A1c was 7.1 and her other ailments including hypertension got asthma were appropriately treated. 05/06/2017 -- she is awaiting the right size of compression stockings from [place] but other than that has been doing well. ====== Old notes 70 year old patient was seen one time last October and was lost to follow-up. She has recurrent problems with weeping and ulceration of her left lower extremity and has swelling of this for several years. It has been worse for the last 2 months. Past medical history is significant for diabetes mellitus type 2, hypertension, gout, morbid obesity, depressive disorders, hiatal hernia, migraines, status post knee surgery, risk of a cholecystectomy, vaginal hysterectomy and breast biopsy. She is not a smoker. As noted before she has never had a venous duplex study and an arterial ABI study was attempted but the left lower extremity was noncompressible 10/01/2016 -- had a lower extremity venous duplex  reflux evaluation which showed  no evidence of deep vein reflux in the right or left lower extremity, and no evidence of great saphenous vein reflux more than 500 ms in the right or left lower extremity, and the left small saphenous vein is incompetent but no vascular consult was recommended. review of her electronic medical records noted that the ABI was checked in July 2017 where the right ABI was normal limits and the left ABI could not be ascertained due to pain with cuff pressure but the waveforms are within normal limits. her arterial duplex study scheduled for April 27. 10/08/2016 -- the patient has various reasons for not having a compression on and for the last 3 days she has had no compression on her left lower extremity either due to pain or the lack of nursing help. She does not use her juxta lites either. 10/15/2016 -- the patient did not keep her appointment for arterial duplex study on April 27 and I have asked her to reschedule this. Her pain is out of proportion with the physical findings and she continuously fails to wear a compression wraps and cuts them off because she says she cannot tolerate the pain. She does not use her juxta lites either. 10/22/2016 -- he has rescheduled her arterial duplex study to May 21 and her pain today is a bit better. She has not been wearing her juxta lites on her right lower extremity but now understands that she needs to do this. She did tolerate the to press compression wrap on her left lower extremity 10/29/2016 --arterial duplex study is scheduled for next week and overall she has been tolerating her compression wraps and also using her juxta lites on her right lower extremity 11/05/2016 -- the right ABI was 0.95 the left was 1.03. The digit TBI is on the right was 0.83 on the left was 0.92 and she had biphasic flow through these vessels. The impression was that of normal lower extremity arterial study. 11/12/2016 -- her pain is minimal and she  is doing very well overall. 11/26/2016 -- she has got juxta lites and her insurance will not pay for additional dual layer compression stockings. She is going to order some from [place]. 05/12/2017 -- her juxta lites are very old and too big for her and these have not been helping with compression. She did get 20-30 mm compression stockings from [place] but she and her husband are unable to put these on. I believe she will benefit from bilateral Extremit-ease, compression stockings and we will measure her for these today. 05/20/2017 -- lymphedema on the left lower extremity has increased a lot and she has a open ulceration as a result of this. The right lower extremity is looking pretty good. She has decided to by the compression stockings herself and will get reimbursed by the home health, at a later date. 05/27/2017 -- her sciatica is bothering her a lot and she thought her left leg pain was caused due to the compression wrap and hence removed it and has significant lymphedema. There is no inflammation on this left lower extremity. 06/17/17 on evaluation today patient appears to be doing very well and in fact is completely healed in regard to her ulcerations. Unfortunately however she does have continued issues with lymphedema nonetheless. We did order compression garments for her unfortunately she states that the size that she received were large although we ordered medium. Obviously this means she is not getting the optimal compression. She does not have those with her today and therefore  we could not confirm and contact the company on her behalf. Nonetheless she does state that she is going to have her husband bring them by tomorrow so that we can verify and then get in touch with the company. No fevers, chills, nausea, or vomiting noted at this time. Overall patient is doing better otherwise and I'm pleased with the progress she has made. 07/01/17 on evaluation today patient appears to be doing  very well in regard to her bilateral lower extremity she does not have any openings at this point which is excellent news. Overall I'm pleased with how things have progressed up to this time. Since she is doing so well we did order her compression which we are seeing her today to ensure that it fits her properly and everything is doing well in that regard and then subsequently she will be discharged. ============ Old Notes: 03/31/16 patient presents today for evaluation concerning open wounds that she has over the left medial ankle region as well as the left dorsal foot. She has previously had this occur although it has been healed for a number of months after having this for about a year prior until her hospitalization on 01/05/16. At that point in time it appears that she was admitted to the hospital for left lower extremity cellulitis and was placed on vancomycin and Zosyn at that point. Eventually upon discharge on January 15, 2016 she was placed on doxycycline at that point in time. Later on 03/27/16 positive wound culture growing Escherichia coli this was switched to amoxicillin. Currently she tells me that she is having pain radiated to be a 7 out of 10 which can be as high as 10 out of 10 with palpation and manipulation of the wound. This wound appears to be mainly venous in nature due to the bilateral lower extremity venous stasis/lymphedema. This is definitely much worse on her left than the right side. She does have type 1 diabetes mellitus, hypertension, morbid obesity, and is wheelchair dependent.during the course of the hospital stay a blood culture was also obtained and fortunately appeared negative. She also had an x-ray of the tibia/fibula on the left which showed no acute bone abnormality. Her white blood cell count which was performed last on 03/25/16 was 7.3, hemoglobin 12.8, protein 7.1, albumin 3.0. Her urine culture appeared to be negative for any specific organisms. Patient did  have a left lower extremity venous duplex evaluation for DVT . This did not include venous reflux studies but fortunately was negative for DVT Patient also had arterial studies performed which revealed that she had a . normal ABI on the right though this was unable to be performed on the left secondary to pain that she was having around the ankle region due to the wound. However it was stated on report that she had biphasic pulses and apparently good blood flow. ========== 06/03/17 she is here in follow-up evaluation for right lower extremity ulcer. The right lower sure he has healed but she has reopened to the left medial malleolus and dorsal foot with weeping. She is waiting for new compression garments to arrive from home health, the previous compression garments were ill fitting. We will continue with compression bilaterally and follow-up in 2 weeks Readmission: 08/05/17 on evaluation today patient appears to be doing somewhat poorly in regard to her left lower extremity especially although the right lower extremity has a small area which may no longer be open. She has been having a lot of drainage from the  left lower extremity however he tells me that she has not been able to use the EXTREMIT-EASE Compression at this point. She states that she did better and was able to actually apply the Juxta-Lite compression although the wound that she has is too large and therefore really does not compress which is why she cannot wear it at this point. She has no one who can help her put it on regular basis her son can sometimes but he's not able to do it most of the time. I do believe that's why she has begun to weave and have issues as she is currently yet again. No fevers, chills, nausea, or vomiting noted at this time. Patient is no evidence of dementia. 08/12/17 on evaluation today patient appears to still be doing fairly well in regard to the draining areas/weeping areas at this point. With that being  said she unfortunately did go to the ER yesterday due to what was felt to be possibly a cellulitis. They place her on doxycycline by mouth and discharge her home. She definitely was not admitted. With that being said she states she has had more discomfort which has been unusual for her even compared to prior times and she's had infections.08/12/17 on evaluation today patient appears to still be doing fairly well in regard to the draining areas/weeping areas at this point. With that being said she unfortunately did go to the ER yesterday due to what was felt to be possibly a cellulitis. They place her on doxycycline by mouth and discharge her home. She definitely was not admitted. With that being said she states she has had more discomfort which has been unusual for her even compared to prior times and she's had infections. 08/19/17 put evaluation today patient tells me that she's been having a lot of what sounds to be neuropathic type pain in regard to her left lower extremity. She has been using over-the-counter topical bins again which some believe. That in order to apply the she actually remove the wrap we put on her last Wednesday on Thursday. Subsequently she has not had anything on compression wise since that time. The good news is a lot of the weeping areas appear to have closed at this point again I believe she would do better with compression but we are struggling to get her to actually use what she needs to at this point. No fevers, chills, nausea, or vomiting noted at this time. 09/03/17 on evaluation today patient appears to be doing okay in regard to her lower extremities in regard to the lymphedema and weeping. Fortunately she does not seem to show any signs of infection at this point she does have a little bit of weeping occurring in the right medial malleolus area. With that being said this does not appear to be too significant which is good news. 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 She has severe skin damage secondary to both of these features involving the dorsal left foot and medial left ankle and lower leg. Still has open areas in the left anterior foot. The area on the right closed over. She uses her own juxta light stockings. She does not have an arterial issue 09/16/17 on evaluation today patient actually appears to be doing excellent in regard to her bilateral lower extremity swelling. The Juxta-Lite compression wrap seem to be doing very well for her. She has not however been using the portion that goes over her foot.  Her left foot still is draining a little bit not nearly as significant as it has been in the past but still I do believe that she likely needs to utilize the full wrap including the foot portion of this will improve as well. She also has been apparently putting on a significant amount of Vaseline which also think is not helpful for her. I recommended that if she feels she needs something for moisturizer Eucerin will probably be better. 09/30/17 on evaluation today patient presents with several new open areas in regard to her left lower extremity although these appear to be minimal and mainly seem to be more moisture breakdown than anything. Fortunately she does not seem to have any evidence of infection which is great news. She has been tolerating the dressing changes without complication we are using silver alginate on the foot she has been using AB pads to have the legs and using her Juxta- Lite compression which seems to be controlling her swelling very well. Overall I'm pleased with the poor way she has progressed. 10/14/17 on evaluation today patient appears to be doing better in regard to her left lower extremity areas of weeping. She does still have some discomfort although in general this does not appear to be as macerated and I think it is progressing nicely. I do think she still  needs to wear the foot portion of her Juxta- Lite in order to get the most benefit from the wrap obviously. She states she understands. Fortunately there does not appear to be evidence of infection at this time which is great news. 10/28/17 on evaluation today patient appears to be doing excellent in regard to her left lower extremity. She has just a couple areas that are still open and seem to be causing any trouble whatsoever. For that reason I think that she is definitely headed in the right direction the spots are very tiny compared to what we have been dealing with in the past. 11/11/17 on evaluation today patient appears to have a right lateral lower extremity ulcer that has opened since I last saw her. She states this is where the home health nurse that was coming out remove the dressing without wetting the alginate first. Nonetheless I do not know if this is indeed the case or not but more importantly we have not ordered home help to be coming out for her wounds at all. I'm unsure as to why they are coming out and we're gonna have to check on this and get things situated in that regard. With that being said we currently really do not need them to be coming out as the patient has been taking care of her leg herself without complication and no issues. In fact she was doing much better prior to nursing coming out. 11/25/17 on evaluation today patient actually appears to be doing fairly well in regard to her left lower extremity swelling. In fact she has very little area of weeping at this point there's just a small spot on the lateral portion of her right leg that still has me just a little bit more concerned as far as wanting to see this clear up before I discharge her to caring for this at home. Nonetheless overall she has made excellent progress. 12/09/17 on evaluation today patient appears to be doing rather well in regard to her lower extremity edema. She does have some weeping still in the left  lower extremity although the big area we were taking care of two  weeks ago actually has closed and she has another area of weeping on the left lower extremity immediately as well is the top of her foot. She does not currently have lymphedema pumps she has been wearing her compression daily on a regular basis as directed. With that being said I think she may benefit from lymphedema pumps. 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 We had a very difficult time getting and keeping this under control. 12/23/17 on evaluation today patient actually appears to be doing a little bit more poorly in regard to her bilateral lower extremities. She has been tolerating the Juxta-Lite compression wraps. Unfortunately she has two new ulcers on the right lower extremity and left lower Trinity ulceration seems to be larger. Obviously this is not good news. She has been tolerating the dressings without complication. 12/30/17 on evaluation today patient actually appears to be doing much better in regard to her bilateral lower extremity edema. She continues to have some issues with ulcerations and in fact there appears to be one spot on each leg where the wrap may have caused a little bit of a blister which is subsequently opened up at this point is given her pain. Fortunately it does not appear to be any evidence of infection which is good news. No fevers chills noted. 01/13/18 on evaluation today patient appears to be doing rather well in regard to her bilateral lower extremities. The dressings did get kind of stuck as far as the wound beds are concerned but again I think this is mainly due to the fact that she actually seems to be showing signs of healing which is good news. She's not having as much drainage therefore she was having more of the dressing sticking.  Nonetheless overall I feel like her swelling is dramatically down compared to previous. 01/20/18 on evaluation today patient unfortunately though she's doing better in most regards has a large blister on the left anterior lower extremity where she is draining quite significantly. Subsequently this is going to need debridement today in order to see what's underneath and ensure she does not continue to trapping fluid at this location. Nonetheless No fevers, chills, nausea, or vomiting noted at this time. 01/27/18 on evaluation today patient appears to be doing rather well at this point in regard to her right lower extremity there's just a very small area that she still has open at this point. With that being said I do believe that she is tolerating the compression wraps very well in making good progress. Home health is coming out at this point to see her. Her left lower extremity on the lateral portion is actually what still mainly open and causing her some discomfort for the most part 02/10/18 on evaluation today patient actually appears to be doing very well in regard to her right lower extremity were all the ulcers appear to be completely close. In regard to the left lower extremity she does have two areas still open and some leaking from the dorsal surface of her foot but this still seems to be doing much better to me in general. 02/24/18 on evaluation today patient actually appears to be doing much better in regard to her right lower extremity this is still completely healed. Her left lower extremity is also doing much better fortunately she has no evidence of infection. The one  area that is gonna require some debridement is still on the left anterior shin. Fortunately this is not hurting her as badly today. 03/10/18 on evaluation today patient appears to be doing better in some regards although she has a little bit more open area on the dorsal foot and she also has some issues on the medial portion of  the left lower extremity which is actually new and somewhat deep. With that being said there fortunately does not appear to be any significant signs of infection which is good news. No fevers, chills, nausea, or vomiting noted at this time. In general her swelling seems to be doing fairly well which is good news. 03/31/18 on evaluation today patient presents for follow-up concerning her left lower extremity lymphedema. Unfortunately she has been doing a little bit more poorly since I last saw her in regard to the amount of weeping that she is experiencing. She's also having some increased pain in the anterior shin location. Unfortunately I do not feel like the patient is making such good progress at this point a few weeks back she was definitely doing much better. 04/07/18 on evaluation today patient actually appears to be showing some signs of improvement as far as the left lower extremity is concerned. She has been tolerating the dressing changes and it does appear that the Drawtex did better for her. With that being said unfortunately home health is stating that they cannot obtain the Drawtex going forward. Nonetheless we're gonna have to check and see what they may be able to get the alginate they were using was getting stuck in causing new areas of skin being pulled all that with and subsequently weep and calls her to worsen overall this is the first time we've seen improvement at this time. 04/14/18 on evaluation today patient actually appears to be doing rather well at this point there does not appear to be any evidence of infection at this time and she is actually doing excellent in regard to the weeping in fact she almost has no openings remaining even compared to just last week this is a dramatic improvement. No fevers chills noted 04/21/18 evaluation today patient actually appears to be doing very well. She in fact is has a small area on the posterior lower extremity location and she has  a small area on the dorsal surface of her foot that are still open both of which are very close to closing. We're hoping this will be close shortly. She brought her Juxta-Lite wrap with her today hoping that would be able to put her in it unfortunately I don't think were quite at that point yet but we're getting closer. 04/28/18 upon evaluation today patient actually appears to be doing excellent in regard to her left lower extremity ulcer. In fact the region on the posterior lower extremity actually is much smaller than previously noted. Overall I'm very happy with the progress she has made. She again did bring her Juxta-Lite although we're not quite ready for that yet. 05/11/18 upon evaluation today patient actually appears to be doing in general fairly well in regard to her left lower Trinity. The swelling is very well controlled. With that being said she has a new area on the left anterior lower extremity as well as between the first and second toes of her left foot that was not present during the last evaluation. The region of her posterior left lower extremity actually appears to be almost completely healed. T be honest I'm very pleased  with o the way that stands. Nonetheless I do believe that the lotion may be keeping the area to moist as far as her legs are concerned subsequently I'm gonna consider discontinuing that today. 05/26/18 on evaluation today patient appears to be doing rather well in regard to her left lower should be ulcers. In fact everything appears to be close except for a very small area on the left posterior lower extremity. Fortunately there does not appear to be any evidence of infection at this time. Overall very pleased with her progress. 06/02/18 and evaluation today patient actually appears to be doing very well in regard to her lower extremity ulcers. She has one small area that still continues to weep that I think may benefit her being able to justify lotion and user  Juxta-Lite wraps versus continued to wrap her. Nonetheless I think this is something we can definitely look into at this point. 06/23/18 on evaluation today patient unfortunately has openings of her bilateral lower extremities. In general she seems to be doing much worse than when I last saw her just as far as her overall health standpoint is concerned. She states that her discomfort is mainly due to neuropathy she's not having any other issues otherwise. No fevers, chills, nausea, or vomiting noted at this time. 06/30/18 on evaluation today patient actually appears to be doing a little worse in regard to her right lower extremity her left lower extremity of doing fairly well. Fortunately there is no sign of infection at this time. She has been tolerating the dressing changes without complication. Home health did not come out like they were supposed to for the appropriate wrap changes. They stated that they never received the orders from [place] which were fax. Nonetheless we will send a copy of the orders with the patient today as well. 07/07/18 on evaluation today patient appears to be doing much better in regard to lower extremities. She still has several openings bilaterally although since I last saw her her legs did show obvious signs of infection when she later saw her nurse. Subsequently a culture was obtained and she is been placed on Bactrim and Keflex. Fortunately things seem to be looking much better it does appear she likely had an infection. Again last week we'd even discussed it but again there really was not any obvious sign that she had infection therefore we held off on the antibiotics. Nonetheless I'm glad she's doing better today. 07/14/18 on evaluation today patient appears to be doing much better regarding her bilateral lower Trinity's. In fact on the right lower for me there's nothing open at this point there are some dry skin areas at the sites where she had infection. Fortunately there is  no evidence of systemic infection which is excellent news. No fevers chills noted 07/21/18 on evaluation today patient actually appears to be doing much better in regard to her left lower extremity ulcers. She is making good progress and overall I feel like she's improving each time I see her. She's having no pain I do feel like the infection is completely resolved which is excellent news. No fevers, chills, nausea, or vomiting noted at this time. 07/28/18 on evaluation today patient appears to be doing very well in regard to her left lower [employer]. Everything seems to be showing signs of improvement which is excellent news. Overall very pleased with the progress that has been made. Fortunately there's no evidence of active infection at this time also excellent news. 08/04/18 on evaluation  today patient appears to be doing more poorly in regard to her bilateral lower extremities. She has two new areas open up on the right and these were completely closed as of last week. She still has the two spots on the left which in my pinion seem to be doing better. Fortunately there's no evidence of infection again at this point. 08/11/18 on evaluation today patient actually appears to be doing very well in regard to her bilateral lower Trinity wounds that all seem to be doing better and are measures smaller today. Fortunately there's no signs of infection. No fevers, chills, nausea, or vomiting noted at this time. 08/18/18 on evaluation today patient actually appears to be doing about the same inverter bilateral lower extremities. She continues to have areas that blistering open as was drain that fortunately nothing too significant. Overall I feel like Drawtex may have done better for her however compared to the collagen. 08/25/18 on evaluation today patient appears to be doing a little bit more poorly today even compared to last time I saw her. Again I'm not exactly sure why she's making worse progress over  the past several weeks. I'm beginning to wonder if there is some kind of underlying low level infection causing this issue. I did actually take a culture from the left anterior lower extremity but it was a new wound draining quite a bit at this point. Unfortunately she also seems to be having more pain which is what also makes me worried about the possibility of infection. This is despite never erythema noted at this point. 09/01/18 on evaluation today patient actually appears to be doing a little worse even compared to last week in regard to bilateral lower extremities. She did go to the hospital on the 16th was given a dose of IV Zosyn and then discharged with a recommendation to continue with the Bactrim that I previously prescribed for her. Nonetheless she is still having a lot of discomfort she tells me as well at this time. This is definitely unfortunate. No fevers, chills, nausea, or vomiting noted at this time. 09/08/18 on evaluation today patient's bilateral lower extremities actually appear to be shown signs of improvement which is good news. Fortunately there does not appear to be any signs of active infection I think the anabiotic is helping in this regard. Overall I'm very pleased with how she is progressing. 09/15/18 patient was actually seen in ER yesterday due to her legs as well unfortunately. She states she's been having a lot of pain and discomfort as well as a lot of drainage. Upon inspection today the patient does have a lot of swelling and drainage I feel like this is more related to lymphedema and poor fluid control than it is to infection based on what I'm seeing. The physician in the emergency department also doubted that the patient was having a significant infection nonetheless I see no evidence of infection obvious at this point although I do see evidence of poor fluid control. She still not using a compression pumps, she is not elevating due to her lift chair as well as her  hospital bed being broken, and she really is not keeping her legs up as much as they should be and also has been taking off her wraps. All this combined I think has led to poor fluid control and to be honest she may be somewhat volume overloaded in general as well. I recommend that she may need to contact your physician to see if a  prescription for a diuretic would be beneficial in their opinion. As long as this is safe I think it would likely help her. 09/29/18 on evaluation today patient's left lower extremity actually appears to be doing quite a bit better. At least compared to last time that I saw her. She still has a large area where she is draining from but there's a lot of new skin speckled trout and in fact there's more new skin that there are open areas of weeping and drainage at this point. This is good news. With regard to the right lower extremity this is doing much better with the only open area that I really see being a dry spot on the right lateral ankle currently. Fortunately there's no signs of active infection at this time which is good news. No fevers, chills, nausea, or vomiting noted at this time. The patient seems somewhat stressed and overwhelmed during the visit today she was very lethargic as such. She does and she is not taking any pain medications at this point. Apparently according to her husband are also in the process of moving which is probably taking its toll on her as well. 10/06/18 on evaluation today patient appears to be doing rather well in regard to her lower extremities compared to last evaluation. Fortunately there's no signs of active infection. She tells me she did have an appointment with her primary. Nonetheless he was concerned that the wounds were somewhat deep based on pictures but we never actually saw her legs. She states that he had her somewhat worried due to the fact that she was fearing now that she was [place] have to have an amputation. With that being  said based on what I'm seeing check she looks better this week that she has the last two times I've seen her with much less drainage I'm actually pleased in this regard. That doesn't mean that she's out of the water but again I do not think what the point of talking about education at all in regard to her leg. She is very happy to hear this. She is also not having as much pain as she was having last week. 10/13/18 unfortunately on evaluation today patient still continues to have a significant amount of drainage she's not letting home health actually apply the compression dressings at this point. She's trying to use of Juxta-Lite of the top of Kerlex and the second layer of the three layer compression wrap. With that being said she just does not seem to be making as good a progress as I would expect if she was having the compression applied and in place on a regular basis. No fevers, chills, nausea, or vomiting noted at this time. 10/20/18 on evaluation today patient appears to be doing a little better in regard to her bilateral lower extremity ulcers. In fact the right lower extremity seems to be healed she doesn't even have any openings at this point left lower extremity though still somewhat macerated seems to be showing signs of new skin growth at multiple locations throughout. Fortunately there's no evidence of active infection at this time. No fevers, chills, nausea, or vomiting noted at this time. 10/27/18 on evaluation today patient appears to be doing much better in regard to her left lower Trinity ulcer. She's been tolerating the laptop complication and has minimal drainage noted at this point. Fortunately there's no signs of active infection at this time. No fevers, chills, nausea, or vomiting noted at this time. 11/03/18 on evaluation today  patient actually appears to be doing excellent in regard to her left lower extremity. She is having very little drainage at this point there does not appear  to be any significant signs of infection overall very pleased with how things have gone. She is likewise extremely pleased still and seems to be making wonderful progress week to week. I do believe antibiotics were helpful for her. Her primary care provider did place on amateur clean since I last saw her. 11/17/18 on evaluation today patient appears to be doing worse in regard to her bilateral lower extremities at this point. She is been tolerating the dressing changes without complication. With that being said she typically takes the Coban off fairly quickly upon arriving home even after being seen here in the clinic and does not allow home health reapply command as part of the dressing at home. Therefore she said no compression essentially since I last saw her as best I can tell. With that being said I think it shows and how much swelling she has in the open wounds that are noted at this point. Fortunately there's no signs of infection but unfortunately if she doesn't get this under control I think she will end up with infection and more significant issues. 11/24/18 on evaluation today patient actually appears to be doing somewhat better in regard to her bilateral lower extremities. 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 Nonetheless that is doing better currently so she should be able to attempt reinitiating the lymphedema pumps at this time. No fevers, chills, nausea, or vomiting noted at this time. 12/01/18 upon evaluation today patient's left lower extremity appears to be doing quite well unfortunately her right lower extremity is not doing nearly as well. She has been tolerating the dressing changes without complication unfortunately she did not keep a wrap on the right at this time. Nonetheless I believe this has led to increased swelling and weeping in  the world is actually much larger than during the last evaluation with her. 12/08/18 on evaluation today patient appears to be doing about the same at this point in regard to her right lower extremity. There is some more palatable to touch I'm concerned about the possibility of there being some infection although I think the main issue is she's not keeping her compression wrap on which in turn is not allowing this area to heal appropriately. 12/22/18 on evaluation today patient appears to be doing better in regard to left lower extremity unfortunately significantly worse in regard to the right lower extremity. The areas of blistering and necrotic superficial tissue have spread and again this does not really appear to be signs of infection and all she just doesn't seem to be doing nearly as well is what she has been in the past. Overall I feel like the Augmentin did absolutely nothing for her she doesn't seem to have any infection again I really didn't think so last time either is more of a potential preventative measure and hoping that this would make some difference but I think the main issue is she's not wearing her compression. She tells me she cannot wear the [place] been we put on she takes it off pretty much upon getting home. Subsequently she worshiped Juxta-Lite when I questioned her about how often she wears it this is no  more than three hours a day obviously that leaves 21 hours that she has no compression and this is obviously not doing well for her. Overall I'm concerned that if things continue to worsen she is at great risk of both infection as well as losing her leg. 01/05/19 on evaluation today patient appears to be doing well in regard to her left lower extremity which he is allowing [place] to wrap and not so well with regard to her right lower extremity which she is not allowing [place] to really wrap and keep the wrap on. She states that it hurts too badly whenever it's wrapped and she ends up  having to take it off. She's been using the Juxta-Lite she tells me up to six hours a day although I question whether or not that's really been the case to be honest. Previously she told me three hours today nonetheless obviously the legs as long as the wrap is doing great when she is not is doing much more poorly. 01/12/2019 on evaluation today patient actually appears to be doing a little better in my opinion with regard to her right lower extremity ulcer. She has a small open area on the left lower extremity unfortunately but again this I think is part of the normal fluctuation of what she is going to have to expect with regard to her legs especially when she is not using her lymphedema pumps on a regular basis. Subsequently based on what I am seeing today I think that she does seem to be doing slightly better with regard to her right lower extremity she did see her primary care provider on Monday they felt she had an infection and placed her on 2 antibiotics. Both Cipro and clindamycin. Subsequently again she seems possibly to be doing a little bit better in regards to the right lower extremity she also tells me however she has been wearing the compression wrap over the past week since I spoke with her as well that is a Kerlix and Coban wrap on the right. No fevers, chills, nausea, vomiting, or diarrhea. 01/19/2019 on evaluation today patient appears to be doing better with regard to her bilateral lower extremities especially the right. I feel like the compression has been beneficial for her which is great news. She did get a call from her primary care provider on her way here today telling her that she did have methicillin- resistant Staphylococcus aureus and he was calling in a couple new antibiotics for her including a ointment to be applied she tells me 3 times a day. With that being said this sounds like likely to be Bactroban which I think could be applied with each dressing/wrap change but I  would not be able to accommodate her applying this 3 times a day. She is in agreement with the least doing this we will add that to her orders today. 01/26/2019 on evaluation today patient actually appears to be doing much better with regard to her right lower extremity. Her left lower extremity is also doing quite well all things considering. Fortunately there is no evidence of active infection at this time. No fevers, chills, nausea, vomiting, or diarrhea. 02/02/2019 on evaluation today patient appears to be doing much better compared to her last evaluation. Little by little off like her right leg is returning more towards normal. There does not appear to be any signs of active infection and overall she seems to be doing quite well which is great news. I am very pleased  in this regard. No fevers, chills, nausea, vomiting, or diarrhea. 02/09/2019 upon evaluation today patient appears to be doing better with regard to her bilateral lower extremities. She has been tolerating the dressing changes without complication. Fortunately there is no signs of active infection at this time. No fevers, chills, nausea, vomiting, or diarrhea. 02/23/2019 on evaluation today patient actually appears to be doing quite well with regard to her bilateral lower extremities. She has been tolerating the dressing changes without complication. She is even used her pumps one time and states that she really felt like it felt good. With that being said she seems to be in good spirits and her legs appear to be doing excellent. 03/09/2019 on evaluation today patient appears to be doing well with regard to her right lower extremity there are no open wounds at this time she is having some discomfort but I feel like this is more neuropathy than anything. With regard to her left lower extremity she had several areas scattered around that she does have some weeping and drainage from but again overall she does not appear to be having any  significant issues and no evidence of infection at this time which is good news. 03/23/2019 on evaluation today patient appears to be doing well with regard to her right lower extremity which she tells me is still close she is using her juxta light here. Her left lower extremity she mainly just has an area on the foot which is still slightly draining although this also is doing great. Overall very pleased at this time. 04/06/2019 patient appears to be doing a little bit worse in regard to her left lower extremity upon evaluation today. She feels like this could be becoming infected again which she had issues with previous. Fortunately there is no signs of systemic infection but again this is always a struggle with her with her legs she will go from doing well to not so well in a very short amount of time. 04/20/2019 on evaluation today patient actually appears to be doing quite well with regard to her right lower extremity I do not see any signs of active infection at this time. Fortunately there is no fever chills noted. She is still taking the antibiotics which I prescribed for her at this point. In regard to the left lower extremity I do feel like some of these areas are better although again she still is having weeping from several locations at this time. 04/27/2019 on evaluation today patient appears to be doing about the same if not slightly worse in regard to her left lower extremity ulcers. She tells me when questioned that she has been sleeping in her Hoveround chair in fact she tells me she falls asleep without even knowing it. I think she is spending a whole lot of time in the chair and less time walking and moving around which is not good for her legs either. On top of that she is in a seated position which is also the worst position she is not really elevating her legs and she is also not using her lymphedema pumps. All this is good to contribute to worsening of her condition in  general. 05/18/2019 on evaluation today patient appears to be doing well with regard to her lower extremity on the right in fact this is showing no signs of any open wounds at this time. On the left she is continuing to have issues with areas that do drain. Some of the regions have healed and  there are couple areas that have reopened. She did go to the ER per the patient according to recommendations from the home health nurse due to what she was seen when she came out on 05/13/2019. Subsequently she felt like the patient needed to go to the hospital due to the fact that again she was having "milky white discharge" from her leg. Nonetheless she had and then was placed on doxycycline and subsequently seems to be doing better. 06/01/2019 upon evaluation today patient appears to be doing really in my opinion about the same. I do not see any signs of active infection which is good news. Overall she still has wounds over the bilateral lower extremities she has reopened on the right but this appears to be more of a crack where there is weeping/edema coming from the region. I do not see any evidence of infection at either site based on what I visualized today. 07/13/2019 upon evaluation today patient appears to be doing a little worse compared to last time I saw her. She since has been in the hospital from 06/21/2019 through 06/29/2019. This was secondary to having Covid. During that time they did apply lotion to her legs which unfortunately has caused her to develop a myriad of open wounds on her lower extremities. Her legs do appear to be doing better as far as the overall appearance is concerned but nonetheless she does have more open and weeping areas. 07/27/2019 upon evaluation today patient appears to be doing more poorly to be honest in regard to her left lower extremity in particular. There is no signs of systemic infection although I do believe she may have local infection. She notes she has been having  a lot of blue/green drainage which is consistent potentially with Pseudomonas. That may be something that we need to consider here as well. The doxycycline does not seem to have been helping. 08/03/2019 upon evaluation today patient appears to be doing a little better in my opinion compared to last week's evaluation. Her culture I did review today and she is on appropriate medications to help treat the Enterobacter that was noted. Overall I feel like that is good news. With that being said she is unfortunately continuing to have a lot of drainage and though it is doing better I still think she has a long ways to go to get things dried up in general. Fortunately there is no signs of systemic infection. 08/10/2019 upon evaluation today patient appears to be doing may be slightly better in regard to her left lower extremity the right lower extremity is doing much better. Fortunately there is no signs of infection right now which is good news. No fevers, chills, nausea, vomiting, or diarrhea. 08/24/2019 on evaluation today patient appears to be doing slightly better in regard to her lower extremities. The left lower extremity seems to be healed the right lower extremity is doing better though not completely healed as far as the openings are concerned. She has some generalized issues here with edema and weeping secondary to her lymphedema though again I do believe this is little bit drier compared to prior weeks evaluations. In general I am very pleased with how things seem to be progressing. No fevers, chills, nausea, vomiting, or diarrhea. 08/31/2019 upon evaluation today patient actually seems to making some progress here with regard to the left lower extremity in particular. She has been tolerating the dressing changes without complication. Fortunately there is no signs of active infection at this time. No fevers,  chills, nausea, vomiting, or diarrhea. She did see Dr. Doren Custard and he did note that she did have  a issue with the left great saphenous vein and the small saphenous vein in the leg. With that being said he was concerned about the possibility of laser ablation not being extremely successful. He also mentioned a small risk of DVT associated with the procedure. However if the wounds do not continue to improve he stated that that would probably be the way to go. Fortunately the patient's legs do seem to be doing much better. 09/07/2019 upon evaluation today patient appears to be doing better with regard to her lower extremities. She has been tolerating the dressing changes without complication. With that being said she is showing signs of improvement and overall very pleased. There are some areas on her leg that I think we do need to debride we discussed this last week the patient is in agreement with doing that as long as it does not hurt too badly. 09/14/2019 upon evaluation today patient appears to be doing decently well with regard to her left lower extremity. She is not having near as much weeping as she has had in the past things seem to be drying up which is good news. There is no signs of active infection at this time. 09/21/19 upon evaluation today patient appears to be doing better in regard overall to her bilateral lower extremities. She again has less open than she did previous and each week I feel like this is getting better. Fortunately there is no signs of active infection at this time. No fevers, chills, nausea, vomiting, or diarrhea. 09/28/2019 upon evaluation today patient actually appears to be showing signs of improvement with regard to her left lower extremity. Unfortunately the right medial lower extremity around the ankle region has reopened to some degree but this appears to be minimal still which is good news. There is no signs of active infection at this time which is also good news. 10/12/2019 upon evaluation today patient appears to be doing okay with regard to her bilateral  lower extremities today. The right is a little bit worse then last evaluation 2 weeks ago. The left is actually doing a little better in my opinion. Overall there is no signs of active infection at this time that I see. Obviously that something we have to keep a close eye on she is very prone to this with the significant and multiple openings that she has over the bilateral lower extremities. 10/19/2019 upon evaluation today patient appears to be doing about the best that I have seen her in quite some time. She has been tolerating the dressing changes without complication. There does not appear to be any signs of active infection and overall I am extremely happy with the way her legs appeared. She is drying up quite nicely and overall is having less pain. 11/09/2019 upon evaluation today patient appears to be doing better in regard to her wounds. She seems to be drying up more and more each time I see her this is just taking a very long time. Fortunately there is no signs of active infection at this time. 11/23/2019 upon evaluation today patient actually appears to be doing excellent in regard to her lower extremities at this point compared to where she has been. Fortunately there is no signs of active infection at this time. She did go to the hospital last week for nausea and vomiting completely unrelated to her wounds. Fortunately she is doing better  she was given some Reglan and got better. She had associated abdominal pain but they never found out what was going on. 12/07/2019 upon evaluation today patient appears to be doing well for the most part in regard to her legs. She unfortunately has not been keeping the [place] portion of her wraps on therefore the compression has not really been sufficient for what it is supposed to be. Nonetheless she tells me that it just hurt too bad therefore she removed it. 12/21/2019 upon evaluation today patient actually appears to be doing quite well with regard to her  legs. I do feel like she has been making progress which is great news and overall there is no signs of active infection at this time. No fevers, chills, nausea, vomiting, or diarrhea. 01/04/2020 upon evaluation today patient presents for follow-up concerning her lower extremity edema bilaterally. She still has open wounds she has not been using her lymphedema pumps. She is also not been utilizing her compression wraps appropriately she tends to unwrap them, take them off, or states that they hurt. Obviously the reason they hurt is because her legs start to swell but the issue is if she would use her compression/lymphedema pumps regularly she would not swell and she would have the pain. Nonetheless she has not even picked them up once honestly over the past several months and may be even as much as in the past year based on my opinion and what have seen. She tells me today that after last week when I talked about this with her specifically actually that was 2 weeks ago that she "forgot". 8//21 on evaluation today patient appears to be doing a little better in regard to her legs bilaterally. Fortunately there is no signs of active infection at this time. She tells me that she used her lymphedema pumps all of one time over the past 2 weeks since I last saw her. She tells me that she has been too busy in order to continue to use these. 02/01/2020 on evaluation today patient appears to be doing some better in regard to her wounds in general in her legs. We felt the right was healed although is not completely it does appear to be doing better she tells me she has been using her lymphedema pumps that she has had this six times since I last saw her. Obviously the more she does that the better she would do my opinion 02/15/2020 upon evaluation today patient appears to be doing about the same in regard to her legs. She tells me that she is pumping I'm still not sure how much she does to be perfectly honest. However  even if she does a little bit here and there I guess that is better than nothing. Fortunately there is no sign of active infection at this time which is great news. No fevers, chills, nausea, vomiting, or diarrhea. 02/29/2020 on evaluation today patient actually appears to be doing quite well all things considered this week. She has been tolerating the dressing changes without complication. Fortunately there is no signs of active infection at this time. No fevers, chills, nausea, vomiting, or diarrhea. 03/14/2020 upon evaluation today patient appears to be doing really about the same in regard to her legs. There is no signs of improvement overall and she as noted from home health does not appear to be elevating her legs he can get into her lift chair. There is too much stuff piled up on it the patient tells me. She also  tells me she cannot really use her pumps effectively due to the fact that she cannot have any space to get them on. Finally she is also not really elevating her legs because she is not sleeping in her bed she is sleeping in her chair currently and again overall I think everything that she is done in combination has been exactly the wrong thing for what she needs for her legs. 04/04/2020 upon evaluation today patient appears to be doing well at this time with regard to her legs. She is actually been pumping, keeping her wraps on, and to be honest she seems to be doing dramatically better the right leg is excellent the left leg is also excellent and measuring much smaller than previous. 04/18/2020 upon evaluation today patient actually is continue to make good progress in regard to her lower extremities bilaterally. Everything is improving and less wet that has been in the past overall I am extremely pleased with where things stand and I think that she is making great progress. The patient tells me she still continue to use her compression pumps 05/02/2020 on evaluation today patient appears  to be doing well at this time in regard to her left leg which is showing signs of drying up. With that being said she does have a lot of lymphedema type crusty skin around the toes of her left foot and the right medial ankle which has opened at this point. Fortunately there is no signs of active infection systemically at this point or even locally for that matter. 05/23/2020 on evaluation today patient appears to be doing well with regard to her lower extremities. Fortunately there is no signs of active infection at this time. No fever chills noted. She has been very depressed however she tells me. 06/06/2020 patient came in today for evaluation in regard to her bilateral lower extremity ulcerations. With that being said she came in feeling okay and actually laughing and joking around with the staff checking her in. Subsequently however she had a coughing spell and following the coughing spell it was a dramatic conversion from being jovial and joking around to being extremely short of breath her vital signs actually dropped in regard to her blood pressure from around 175 to down around 433 for systolic and from around 96 diastolic down to around 70. With that being said she also accompanied this with an increase in her respiratory rate which was also quite significant. Nonetheless I actually upon going into see her was extremely worried we called EMS to have her transported to the ER for further evaluation and treatment. She continued until EMS got here to be extremely short of breath even on 6 L of oxygen. Her oxygen saturation did come up to 99% but overall it was only 95 before which was not terrible she did feel like the oxygen helped her feel somewhat better however. She has a history of asthma but again even the coughing spell really should not have done this degree of alteration in her demeanor from where she was just before to after the coughing spell. She tells [place] however she has been feeling  somewhat abnormal since Friday although she could not really pinpoint exactly what was going on. 06/27/2020 plan evaluation today patient appears to be doing okay in regard to her leg ulcers. She is really not showing a lot of improvement to be honest and she still has a lot of weeping. With that being said after I last saw her we called  EMS to take her to the hospital she actually did not go she went to see her primary care provider who according to the patient have not identified and read through the note states that everything checked out okay. Nonetheless the patient has continued to have bouts where she gets very short of breath for seemingly no reason whatsoever. That happened even the same night that I saw her last time. This was after she refused transport to the hospital. With that being said she also tells me that just getting dressed to come here is quite a chore which is putting on her shirt causing her to have to stop to catch her breath. Obviously this is not normal and I feel like there is something going on. She may need a referral to be seen by cardiology ASAP. She does see her primary care provider early next week she tells me 07/11/2020 upon evaluation today patient's wounds again appear to be doing about the same she mainly has weeping of the bilateral lower extremities both are very swollen today she tells me she has been using her pumps but last time I saw her she told me she did not even have her pumps out that she had "packed them away because they had too much stuff in their house. Apparently her husband has been bringing stuff in from storage units. She then subsequently told me that she had so much stuff in her house that she has been staying up all night and did not go to bed till 7:00 this morning because she was cleaning up stuff. Nonetheless I am still unsure as to whether or not she is using the pumps based on what I am seeing I would think probably not. Nonetheless she  has been keeping the compression wraps in place that is at least something 07/25/2020 upon evaluation today patient appears to be doing well currently in regard to her legs all things considered. I do not think she is doing any worse significantly although honestly I do not think she is doing a lot better either. There does not appear to be any signs of infection which is good news although she does have a lot of weeping. She is using her pumps she tells me sporadically though she says that her husband is not a big fan of helping her to get the [place]. She also tells me is difficult because in their house. It would be easiest for her to use these as where her son is actually staying with her and living out at this point. Obviously I understand that she is having a lot of issues here and to be honest I do not really know what to say in that regard and the fact that I really cannot change the situations but I do believe she really needs to be using the lymphedema pumps to try to keep things under control here. Outside of that I think she is going to continue to have significant issues. I think the pumps coupled with good compression and elevation are the only things that are to help her at this point. Electronic Signature(s) Signed: 07/25/2020 2:06:37 PM By: [name] PA-C Previous Signature: 07/25/2020 2:06:22 PM Version By: [name] PA-C Entered By: [name] on 07/25/2020 14:06:37 -------------------------------------------------------------------------------- Physical Exam Details Patient Name: Date of Service: [name]. 07/25/2020 11:00 A M Medical Record Number: [id number] Patient Account Number: [id number] Date of Birth/Sex: Treating RN: 03-01-1951 (70 y.o. [name] Primary Care Provider:  [name] Other Clinician: Referring Provider: Treating Provider/Extender: Darlen Round in Treatment: 155 Constitutional Obese and well-hydrated in  no acute distress. Respiratory normal breathing without difficulty. Psychiatric this patient is able to make decisions and demonstrates good insight into disease process. Alert and Oriented x 3. pleasant and cooperative. Notes Upon evaluation today the drainage actually seems to be about the same in regard to the patient's legs currently. She does tell me the alginate however is getting stuck. For that reason I do think she may benefit from the switching to something such as XtraSorb which I think will do better as far as absorbing but at the same time not allowing it to really stick to the legs and keeping some of the moisture off of the legs themselves. Electronic Signature(s) Signed: 07/25/2020 2:07:09 PM By: [name] PA-C Entered By: [name] on 07/25/2020 14:07:08 -------------------------------------------------------------------------------- Physician Orders Details Patient Name: Date of Service: [name]. 07/25/2020 11:00 A M Medical Record Number: [id number] Patient Account Number: [id number] Date of Birth/Sex: Treating RN: 1950-11-11 (70 y.o. [name] Primary Care Provider: [name] Other Clinician: Referring Provider: Treating Provider/Extender: Darlen Round in Treatment: [phone] Verbal / Phone Orders: No Diagnosis Coding ICD-10 Coding Code Description E11.622 Type 2 diabetes mellitus with other skin ulcer I89.0 Lymphedema, not elsewhere classified I87.331 Chronic venous hypertension (idiopathic) with ulcer and inflammation of right lower extremity I87.332 Chronic venous hypertension (idiopathic) with ulcer and inflammation of left lower extremity L97.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 leg with fat layer exposed L97.822 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 I10 Essential  (primary) hypertension E66.01 Morbid (severe) obesity due to excess calories F41.8 Other specified anxiety disorders R53.1 Weakness Follow-up Appointments Return Appointment in 2 weeks. Bathing/ Shower/ Hygiene May shower and wash wound with soap and water. - with dressing changes, wash both legs with wash cloth and soap and water with dressing changes Edema Control - Lymphedema / SCD / Other Bilateral Lower Extremities Lymphedema Pumps. Use Lymphedema pumps on leg(s) 2-3 times a day for 45-60 minutes. If wearing any wraps or hose, do not remove them. Continue exercising as instructed. Elevate legs to the level of the heart or above for 30 minutes daily and/or when sitting, a frequency of: Avoid standing for long periods of time. Exercise regularly Home Health No change in wound care orders this week; continue Home Health for wound care. May utilize formulary equivalent dressing for wound treatment orders unless otherwise specified. Other Home Health Orders/Instructions: - [facility] Wound #61 - Lower Leg Wound Laterality: Left, Circumferential Peri-Wound Care: Zinc Oxide Ointment 30g tube 2 x Per Week/30 Days Discharge Instructions: Apply Zinc Oxiide to weeping areas with each dressing change Peri-Wound Care: Sween Lotion (Moisturizing lotion) (Home Health) 2 x Per Week/30 Days Discharge Instructions: Apply moisturizing lotion as directed Secondary Dressing: Zetuvit Plus 4x4 in 2 x Per Week/30 Days Discharge Instructions: Apply over primary dressing as directed. Secondary Dressing: Zetuvit Plus 4x8 in 2 x Per Week/30 Days Discharge Instructions: or equivalent extra absorbent pad. Apply over primary dressing as directed. Compression Wrap: ThreePress (3 layer compression wrap) (Home Health) 2 x Per Week/30 Days Discharge Instructions: Apply three layer compression as directed. Pad bend of ankle with foam or ABD pad. Wound #64 - Foot Wound Laterality: Dorsal,  Left Peri-Wound Care: Zinc Oxide Ointment 30g tube 2 x Per Week/30 Days Discharge Instructions: Apply Zinc Oxiide to weeping areas with each dressing change Peri-Wound Care: Sween Lotion (Moisturizing lotion) (Home Health) 2 x Per Week/30 Days Discharge Instructions: Apply moisturizing lotion as directed Secondary Dressing: Zetuvit Plus 4x4 in 2 x Per Week/30 Days Discharge Instructions: Apply over primary dressing as directed. Secondary Dressing: Zetuvit Plus 4x8 in 2 x Per Week/30 Days Discharge Instructions: or equivalent extra absorbent pad. Apply over primary dressing as directed. Compression Wrap: ThreePress (3 layer compression wrap) (Home Health) 2 x Per Week/30 Days Discharge Instructions: Apply three layer compression as directed. Pad bend of ankle with foam or ABD pad. Wound #67 - Lower Leg Wound Laterality: Right, Medial Peri-Wound Care: Zinc Oxide Ointment 30g tube 2 x Per Week/30 Days Discharge Instructions: Apply Zinc Oxiide to weeping areas with each dressing change Peri-Wound Care: Sween Lotion (Moisturizing lotion) (Home Health) 2 x Per Week/30 Days Discharge Instructions: Apply moisturizing lotion as directed Secondary Dressing: Zetuvit Plus 4x4 in 2 x Per Week/30 Days Discharge Instructions: Apply over primary dressing as directed. Secondary Dressing: Zetuvit Plus 4x8 in 2 x Per Week/30 Days Discharge Instructions: or equivalent extra absorbent pad. Apply over primary dressing as directed. Compression Wrap: ThreePress (3 layer compression wrap) (Home Health) 2 x Per Week/30 Days Discharge Instructions: Apply three layer compression as directed. Pad bend of ankle with foam or ABD pad. Wound #69 - Foot Wound Laterality: Dorsal, Right Peri-Wound Care: Zinc Oxide Ointment 30g tube 2 x Per Week/30 Days Discharge Instructions: Apply Zinc Oxiide to weeping areas with each dressing change Peri-Wound Care: Sween Lotion (Moisturizing lotion) (Home Health) 2 x Per Week/30  Days Discharge Instructions: Apply moisturizing lotion as directed Secondary Dressing: Zetuvit Plus 4x4 in 2 x Per Week/30 Days Discharge Instructions: Apply over primary dressing as directed. Secondary Dressing: Zetuvit Plus 4x8 in 2 x Per Week/30 Days Discharge Instructions: or equivalent extra absorbent pad. Apply over primary dressing as directed. Compression Wrap: ThreePress (3 layer compression wrap) (Home Health) 2 x Per Week/30 Days Discharge Instructions: Apply three layer compression as directed. Pad bend of ankle with foam or ABD pad. Electronic Signature(s) Signed: 07/25/2020 5:10:26 PM By: [name] RN, BSN Signed: 07/25/2020 6:51:30 PM By: [name] PA-C Entered By: [name] on 07/25/2020 11:44:45 -------------------------------------------------------------------------------- Problem List Details Patient Name: Date of Service: [name]. 07/25/2020 11:00 A M Medical Record Number: [id number] Patient Account Number: [id number] Date of Birth/Sex: Treating RN: 1951/06/09 (70 y.o. [name] Primary Care Provider: [name] Other Clinician: Referring Provider: Treating Provider/Extender: Darlen Round in Treatment: [phone] Active Problems ICD-10 Encounter Code Description Active Date MDM Diagnosis E11.622 Type 2 diabetes mellitus with other skin ulcer 08/05/2017 No Yes I89.0 Lymphedema, not elsewhere classified 08/05/2017 No Yes I87.331 Chronic venous hypertension (idiopathic) with ulcer and inflammation of right 08/05/2017 No Yes lower extremity I87.332 Chronic venous hypertension (idiopathic) with ulcer and inflammation of left 08/05/2017 No Yes lower extremity L97.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 leg with fat layer 08/05/2017 No Yes exposed L97.822 Non-pressure chronic ulcer of other part of left lower leg with fat layer exposed2/20/2019 No Yes L97.522 Non-pressure chronic ulcer of other part of  left foot with fat layer exposed 06/01/2019 No Yes I10 Essential (primary) hypertension 08/05/2017 No Yes E66.01 Morbid (severe) obesity due to excess calories 08/05/2017 No Yes F41.8 Other specified anxiety disorders 08/05/2017 No Yes R53.1 Weakness 08/05/2017 No  Yes Inactive Problems Resolved Problems Electronic Signature(s) Signed: 07/25/2020 11:32:41 AM By: [name] PA-C Entered By: [name] on 07/25/2020 11:32:40 -------------------------------------------------------------------------------- Progress Note Details Patient Name: Date of Service: [name]. 07/25/2020 11:00 A M Medical Record Number: [id number] Patient Account Number: [id number] Date of Birth/Sex: Treating RN: 05-17-1951 (70 y.o. [name] Primary Care Provider: [name] Other Clinician: Referring Provider: Treating Provider/Extender: Darlen Round in Treatment: 155 Subjective Chief Complaint Information obtained from Patient Bilateral reoccurring LE ulcers History of Present Illness (HPI) this patient has been seen a couple of times before and returns with recurrent problems to her right and left lower extremity with swelling and weeping ulcerations due to not wearing her compression stockings which she had been advised to do during her last discharge, at the end of June 2018. During her last visit the patient had had normal arterial blood flow and her venous reflux study did not necessitate any surgical intervention. She was recommended compression and elevation and wound care. After prolonged treatment the patient was completely healed but she has been noncompliant with wearing or compressions.. She was here last week with an outpatient return visit planned but the patient came in a very poor general condition with altered mental status and was rushed to the ER on my request. With a history of hypertension, diabetes, TIA and right-sided weakness she was set up for an  MRI on her brain and cervical spine and was sent to [facility]. Getting an MRI done was very difficult but once the workup was done she was found not to have any spinal stenosis, epidural abscess or hematoma or discitis. This was radiculopathy to be treated as an outpatient and she was given a follow-up appointment. Today she is feeling much better alert and oriented and has come to reevaluate her bilateral lower extremity lymphedema and ulceration 03/25/2017 -- she was admitted to the hospital on 03/16/2017 and discharged on 03/18/2017 with left leg cellulitis and ulceration. She was started on vancomycin and Zosyn and x-ray showed no bony involvement. She was treated for a cellulitis with IV antibiotics changed to Rocephin and Flagyl and was discharged on oral Keflex and doxycycline to complete a 7 day course. Last hemoglobin A1c was 7.1 and her other ailments including hypertension got asthma were appropriately treated. 05/06/2017 -- she is awaiting the right size of compression stockings from [facility] but other than that has been doing well. ====== Old notes 70 year old patient was seen one time last October and was lost to follow-up. She has recurrent problems with weeping and ulceration of her left lower extremity and has swelling of this for several years. It has been worse for the last 2 months. Past medical history is significant for diabetes mellitus type 2, hypertension, gout, morbid obesity, depressive disorders, hiatal hernia, migraines, status post knee surgery, risk of a cholecystectomy, vaginal hysterectomy and breast biopsy. She is not a smoker. As noted before she has never had a venous duplex study and an arterial ABI study was attempted but the left lower extremity was noncompressible 10/01/2016 -- had a lower extremity venous duplex reflux evaluation which showed no evidence of deep vein reflux in the right or left lower extremity, and no evidence of great saphenous vein reflux  more than 500 ms in the right or left lower extremity, and the left small saphenous vein is incompetent but no vascular consult was recommended. review of her electronic medical records noted that the ABI was  checked in July 2017 where the right ABI was normal limits and the left ABI could not be ascertained due to pain with cuff pressure but the waveforms are within normal limits. her arterial duplex study scheduled for April 27. 10/08/2016 -- the patient has various reasons for not having a compression on and for the last 3 days she has had no compression on her left lower extremity either due to pain or the lack of nursing help. She does not use her juxta lites either. 10/15/2016 -- the patient did not keep her appointment for arterial duplex study on April 27 and I have asked her to reschedule this. Her pain is out of proportion with the physical findings and she continuously fails to wear a compression wraps and cuts them off because she says she cannot tolerate the pain. She does not use her juxta lites either. 10/22/2016 -- he has rescheduled her arterial duplex study to May 21 and her pain today is a bit better. She has not been wearing her juxta lites on her right lower extremity but now understands that she needs to do this. She did tolerate the to press compression wrap on her left lower extremity 10/29/2016 --arterial duplex study is scheduled for next week and overall she has been tolerating her compression wraps and also using her juxta lites on her right lower extremity 11/05/2016 -- the right ABI was 0.95 the left was 1.03. The digit TBI is on the right was 0.83 on the left was 0.92 and she had biphasic flow through these vessels. The impression was that of normal lower extremity arterial study. 11/12/2016 -- her pain is minimal and she is doing very well overall. 11/26/2016 -- she has got juxta lites and her insurance will not pay for additional dual layer compression stockings. She  is going to order some from [place]. 05/12/2017 -- her juxta lites are very old and too big for her and these have not been helping with compression. She did get 20-30 mm compression stockings from [facility] but she and her husband are unable to put these on. I believe she will benefit from bilateral Extremit-ease, compression stockings and we will measure her for these today. 05/20/2017 -- lymphedema on the left lower extremity has increased a lot and she has a open ulceration as a result of this. The right lower extremity is looking pretty good. She has decided to by the compression stockings herself and will get reimbursed by the home health, at a later date. 05/27/2017 -- her sciatica is bothering her a lot and she thought her left leg pain was caused due to the compression wrap and hence removed it and has significant lymphedema. There is no inflammation on this left lower extremity. 06/17/17 on evaluation today patient appears to be doing very well and in fact is completely healed in regard to her ulcerations. Unfortunately however she does have continued issues with lymphedema nonetheless. We did order compression garments for her unfortunately she states that the size that she received were large although we ordered medium. Obviously this means she is not getting the optimal compression. She does not have those with her today and therefore we could not confirm and contact the company on her behalf. Nonetheless she does state that she is going to have her husband bring them by tomorrow so that we can verify and then get in touch with the company. No fevers, chills, nausea, or vomiting noted at this time. Overall patient is doing better otherwise and  I'm pleased with the progress she has made. 07/01/17 on evaluation today patient appears to be doing very well in regard to her bilateral lower extremity she does not have any openings at this point which is excellent news. Overall I'm pleased with how  things have progressed up to this time. Since she is doing so well we did order her compression which we are seeing her today to ensure that it fits her properly and everything is doing well in that regard and then subsequently she will be discharged. ============ Old Notes: 03/31/16 patient presents today for evaluation concerning open wounds that she has over the left medial ankle region as well as the left dorsal foot. She has previously had this occur although it has been healed for a number of months after having this for about a year prior until her hospitalization on 01/05/16. At that point in time it appears that she was admitted to the hospital for left lower extremity cellulitis and was placed on vancomycin and Zosyn at that point. Eventually upon discharge on January 15, 2016 she was placed on doxycycline at that point in time. Later on 03/27/16 positive wound culture growing Escherichia coli this was switched to amoxicillin. Currently she tells me that she is having pain radiated to be a 7 out of 10 which can be as high as 10 out of 10 with palpation and manipulation of the wound. This wound appears to be mainly venous in nature due to the bilateral lower extremity venous stasis/lymphedema. This is definitely much worse on her left than the right side. She does have type 1 diabetes mellitus, hypertension, morbid obesity, and is wheelchair dependent.during the course of the hospital stay a blood culture was also obtained and fortunately appeared negative. She also had an x-ray of the tibia/fibula on the left which showed no acute bone abnormality. Her white blood cell count which was performed last on 03/25/16 was 7.3, hemoglobin 12.8, protein 7.1, albumin 3.0. Her urine culture appeared to be negative for any specific organisms. Patient did have a left lower extremity venous duplex evaluation for DVT . This did not include venous reflux studies but fortunately was negative for DVT Patient also  had arterial studies performed which revealed that she had a . normal ABI on the right though this was unable to be performed on the left secondary to pain that she was having around the ankle region due to the wound. However it was stated on report that she had biphasic pulses and apparently good blood flow. ========== 06/03/17 she is here in follow-up evaluation for right lower extremity ulcer. The right lower sure he has healed but she has reopened to the left medial malleolus and dorsal foot with weeping. She is waiting for new compression garments to arrive from home health, the previous compression garments were ill fitting. We will continue with compression bilaterally and follow-up in 2 weeks Readmission: 08/05/17 on evaluation today patient appears to be doing somewhat poorly in regard to her left lower extremity especially although the right lower extremity has a small area which may no longer be open. She has been having a lot of drainage from the left lower extremity however he tells me that she has not been able to use the EXTREMIT-EASE Compression at this point. She states that she did better and was able to actually apply the Juxta-Lite compression although the wound that she has is too large and therefore really does not compress which is why she cannot wear  it at this point. She has no one who can help her put it on regular basis her son can sometimes but he's not able to do it most of the time. I do believe that's why she has begun to weave and have issues as she is currently yet again. No fevers, chills, nausea, or vomiting noted at this time. Patient is no evidence of dementia. 08/12/17 on evaluation today patient appears to still be doing fairly well in regard to the draining areas/weeping areas at this point. With that being said she unfortunately did go to the ER yesterday due to what was felt to be possibly a cellulitis. They place her on doxycycline by mouth and discharge her  home. She definitely was not admitted. With that being said she states she has had more discomfort which has been unusual for her even compared to prior times and she's had infections.08/12/17 on evaluation today patient appears to still be doing fairly well in regard to the draining areas/weeping areas at this point. With that being said she unfortunately did go to the ER yesterday due to what was felt to be possibly a cellulitis. They place her on doxycycline by mouth and discharge her home. She definitely was not admitted. With that being said she states she has had more discomfort which has been unusual for her even compared to prior times and she's had infections. 08/19/17 put evaluation today patient tells me that she's been having a lot of what sounds to be neuropathic type pain in regard to her left lower extremity. She has been using over-the-counter topical bins again which some believe. That in order to apply the she actually remove the wrap we put on her last Wednesday on Thursday. Subsequently she has not had anything on compression wise since that time. The good news is a lot of the weeping areas appear to have closed at this point again I believe she would do better with compression but we are struggling to get her to actually use what she needs to at this point. No fevers, chills, nausea, or vomiting noted at this time. 09/03/17 on evaluation today patient appears to be doing okay in regard to her lower extremities in regard to the lymphedema and weeping. Fortunately she does not seem to show any signs of infection at this point she does have a little bit of weeping occurring in the right medial malleolus area. With that being said this does not appear to be too significant which is good news. 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 She has severe skin damage secondary to both of these features involving the dorsal  left foot and medial left ankle and lower leg. Still has open areas in the left anterior foot. The area on the right closed over. She uses her own juxta light stockings. She does not have an arterial issue 09/16/17 on evaluation today patient actually appears to be doing excellent in regard to her bilateral lower extremity swelling. The Juxta-Lite compression wrap seem to be doing very well for her. She has not however been using the portion that goes over her foot. Her left foot still is draining a little bit not nearly as significant as it has been in the past but still I do believe that she likely needs to utilize the full wrap including the foot portion of this will improve as well. She also has been apparently putting on a significant amount of Vaseline which  also think is not helpful for her. I recommended that if she feels she needs something for moisturizer Eucerin will probably be better. 09/30/17 on evaluation today patient presents with several new open areas in regard to her left lower extremity although these appear to be minimal and mainly seem to be more moisture breakdown than anything. Fortunately she does not seem to have any evidence of infection which is great news. She has been tolerating the dressing changes without complication we are using silver alginate on the foot she has been using AB pads to have the legs and using her Juxta- Lite compression which seems to be controlling her swelling very well. Overall I'm pleased with the poor way she has progressed. 10/14/17 on evaluation today patient appears to be doing better in regard to her left lower extremity areas of weeping. She does still have some discomfort although in general this does not appear to be as macerated and I think it is progressing nicely. I do think she still needs to wear the foot portion of her Juxta- Lite in order to get the most benefit from the wrap obviously. She states she understands. Fortunately there does  not appear to be evidence of infection at this time which is great news. 10/28/17 on evaluation today patient appears to be doing excellent in regard to her left lower extremity. She has just a couple areas that are still open and seem to be causing any trouble whatsoever. For that reason I think that she is definitely headed in the right direction the spots are very tiny compared to what we have been dealing with in the past. 11/11/17 on evaluation today patient appears to have a right lateral lower extremity ulcer that has opened since I last saw her. She states this is where the home health nurse that was coming out remove the dressing without wetting the alginate first. Nonetheless I do not know if this is indeed the case or not but more importantly we have not ordered home help to be coming out for her wounds at all. I'm unsure as to why they are coming out and we're gonna have to check on this and get things situated in that regard. With that being said we currently really do not need them to be coming out as the patient has been taking care of her leg herself without complication and no issues. In fact she was doing much better prior to nursing coming out. 11/25/17 on evaluation today patient actually appears to be doing fairly well in regard to her left lower extremity swelling. In fact she has very little area of weeping at this point there's just a small spot on the lateral portion of her right leg that still has me just a little bit more concerned as far as wanting to see this clear up before I discharge her to caring for this at home. Nonetheless overall she has made excellent progress. 12/09/17 on evaluation today patient appears to be doing rather well in regard to her lower extremity edema. She does have some weeping still in the left lower extremity although the big area we were taking care of two weeks ago actually has closed and she has another area of weeping on the left lower  extremity immediately as well is the top of her foot. She does not currently have lymphedema pumps she has been wearing her compression daily on a regular basis as directed. With that being said I think she may benefit from lymphedema  pumps. 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 We had a very difficult time getting and keeping this under control. 12/23/17 on evaluation today patient actually appears to be doing a little bit more poorly in regard to her bilateral lower extremities. She has been tolerating the Juxta-Lite compression wraps. Unfortunately she has two new ulcers on the right lower extremity and left lower Trinity ulceration seems to be larger. Obviously this is not good news. She has been tolerating the dressings without complication. 12/30/17 on evaluation today patient actually appears to be doing much better in regard to her bilateral lower extremity edema. She continues to have some issues with ulcerations and in fact there appears to be one spot on each leg where the wrap may have caused a little bit of a blister which is subsequently opened up at this point is given her pain. Fortunately it does not appear to be any evidence of infection which is good news. No fevers chills noted. 01/13/18 on evaluation today patient appears to be doing rather well in regard to her bilateral lower extremities. The dressings did get kind of stuck as far as the wound beds are concerned but again I think this is mainly due to the fact that she actually seems to be showing signs of healing which is good news. She's not having as much drainage therefore she was having more of the dressing sticking. Nonetheless overall I feel like her swelling is dramatically down compared to previous. 01/20/18 on evaluation today patient unfortunately though she's doing  better in most regards has a large blister on the left anterior lower extremity where she is draining quite significantly. Subsequently this is going to need debridement today in order to see what's underneath and ensure she does not continue to trapping fluid at this location. Nonetheless No fevers, chills, nausea, or vomiting noted at this time. 01/27/18 on evaluation today patient appears to be doing rather well at this point in regard to her right lower extremity there's just a very small area that she still has open at this point. With that being said I do believe that she is tolerating the compression wraps very well in making good progress. Home health is coming out at this point to see her. Her left lower extremity on the lateral portion is actually what still mainly open and causing her some discomfort for the most part 02/10/18 on evaluation today patient actually appears to be doing very well in regard to her right lower extremity were all the ulcers appear to be completely close. In regard to the left lower extremity she does have two areas still open and some leaking from the dorsal surface of her foot but this still seems to be doing much better to me in general. 02/24/18 on evaluation today patient actually appears to be doing much better in regard to her right lower extremity this is still completely healed. Her left lower extremity is also doing much better fortunately she has no evidence of infection. The one area that is gonna require some debridement is still on the left anterior shin. Fortunately this is not hurting her as badly today. 03/10/18 on evaluation today patient appears to be doing better in some regards although she has a little bit more open area on the dorsal foot and she also has some issues on the  medial portion of the left lower extremity which is actually new and somewhat deep. With that being said there fortunately does not appear to be any significant signs of  infection which is good news. No fevers, chills, nausea, or vomiting noted at this time. In general her swelling seems to be doing fairly well which is good news. 03/31/18 on evaluation today patient presents for follow-up concerning her left lower extremity lymphedema. Unfortunately she has been doing a little bit more poorly since I last saw her in regard to the amount of weeping that she is experiencing. She's also having some increased pain in the anterior shin location. Unfortunately I do not feel like the patient is making such good progress at this point a few weeks back she was definitely doing much better. 04/07/18 on evaluation today patient actually appears to be showing some signs of improvement as far as the left lower extremity is concerned. She has been tolerating the dressing changes and it does appear that the Drawtex did better for her. With that being said unfortunately home health is stating that they cannot obtain the Drawtex going forward. Nonetheless we're gonna have to check and see what they may be able to get the alginate they were using was getting stuck in causing new areas of skin being pulled all that with and subsequently weep and calls her to worsen overall this is the first time we've seen improvement at this time. 04/14/18 on evaluation today patient actually appears to be doing rather well at this point there does not appear to be any evidence of infection at this time and she is actually doing excellent in regard to the weeping in fact she almost has no openings remaining even compared to just last week this is a dramatic improvement. No fevers chills noted 04/21/18 evaluation today patient actually appears to be doing very well. She in fact is has a small area on the posterior lower extremity location and she has a small area on the dorsal surface of her foot that are still open both of which are very close to closing. We're hoping this will be close shortly. She  brought her Juxta-Lite wrap with her today hoping that would be able to put her in it unfortunately I don't think were quite at that point yet but we're getting closer. 04/28/18 upon evaluation today patient actually appears to be doing excellent in regard to her left lower extremity ulcer. In fact the region on the posterior lower extremity actually is much smaller than previously noted. Overall I'm very happy with the progress she has made. She again did bring her Juxta-Lite although we're not quite ready for that yet. 05/11/18 upon evaluation today patient actually appears to be doing in general fairly well in regard to her left lower Trinity. The swelling is very well controlled. With that being said she has a new area on the left anterior lower extremity as well as between the first and second toes of her left foot that was not present during the last evaluation. The region of her posterior left lower extremity actually appears to be almost completely healed. T be honest I'm very pleased with o the way that stands. Nonetheless I do believe that the lotion may be keeping the area to moist as far as her legs are concerned subsequently I'm gonna consider discontinuing that today. 05/26/18 on evaluation today patient appears to be doing rather well in regard to her left lower should be ulcers. In fact everything  appears to be close except for a very small area on the left posterior lower extremity. Fortunately there does not appear to be any evidence of infection at this time. Overall very pleased with her progress. 06/02/18 and evaluation today patient actually appears to be doing very well in regard to her lower extremity ulcers. She has one small area that still continues to weep that I think may benefit her being able to justify lotion and user Juxta-Lite wraps versus continued to wrap her. Nonetheless I think this is something we can definitely look into at this point. 06/23/18 on evaluation  today patient unfortunately has openings of her bilateral lower extremities. In general she seems to be doing much worse than when I last saw her just as far as her overall health standpoint is concerned. She states that her discomfort is mainly due to neuropathy she's not having any other issues otherwise. No fevers, chills, nausea, or vomiting noted at this time. 06/30/18 on evaluation today patient actually appears to be doing a little worse in regard to her right lower extremity her left lower extremity of doing fairly well. Fortunately there is no sign of infection at this time. She has been tolerating the dressing changes without complication. Home health did not come out like they were supposed to for the appropriate wrap changes. They stated that they never received the orders from [place] which were fax. Nonetheless we will send a copy of the orders with the patient today as well. 07/07/18 on evaluation today patient appears to be doing much better in regard to lower extremities. She still has several openings bilaterally although since I last saw her her legs did show obvious signs of infection when she later saw her nurse. Subsequently a culture was obtained and she is been placed on Bactrim and Keflex. Fortunately things seem to be looking much better it does appear she likely had an infection. Again last week we'd even discussed it but again there really was not any obvious sign that she had infection therefore we held off on the antibiotics. Nonetheless I'm glad she's doing better today. 07/14/18 on evaluation today patient appears to be doing much better regarding her bilateral lower Trinity's. In fact on the right lower for me there's nothing open at this point there are some dry skin areas at the sites where she had infection. Fortunately there is no evidence of systemic infection which is excellent news. No fevers chills noted 07/21/18 on evaluation today patient actually appears to be doing  much better in regard to her left lower extremity ulcers. She is making good progress and overall I feel like she's improving each time I see her. She's having no pain I do feel like the infection is completely resolved which is excellent news. No fevers, chills, nausea, or vomiting noted at this time. 07/28/18 on evaluation today patient appears to be doing very well in regard to her left lower [employer]. Everything seems to be showing signs of improvement which is excellent news. Overall very pleased with the progress that has been made. Fortunately there's no evidence of active infection at this time also excellent news. 08/04/18 on evaluation today patient appears to be doing more poorly in regard to her bilateral lower extremities. She has two new areas open up on the right and these were completely closed as of last week. She still has the two spots on the left which in my pinion seem to be doing better. Fortunately there's no evidence of  infection again at this point. 08/11/18 on evaluation today patient actually appears to be doing very well in regard to her bilateral lower Trinity wounds that all seem to be doing better and are measures smaller today. Fortunately there's no signs of infection. No fevers, chills, nausea, or vomiting noted at this time. 08/18/18 on evaluation today patient actually appears to be doing about the same inverter bilateral lower extremities. She continues to have areas that blistering open as was drain that fortunately nothing too significant. Overall I feel like Drawtex may have done better for her however compared to the collagen. 08/25/18 on evaluation today patient appears to be doing a little bit more poorly today even compared to last time I saw her. Again I'm not exactly sure why she's making worse progress over the past several weeks. I'm beginning to wonder if there is some kind of underlying low level infection causing this issue. I did actually take a  culture from the left anterior lower extremity but it was a new wound draining quite a bit at this point. Unfortunately she also seems to be having more pain which is what also makes me worried about the possibility of infection. This is despite never erythema noted at this point. 09/01/18 on evaluation today patient actually appears to be doing a little worse even compared to last week in regard to bilateral lower extremities. She did go to the hospital on the 16th was given a dose of IV Zosyn and then discharged with a recommendation to continue with the Bactrim that I previously prescribed for her. Nonetheless she is still having a lot of discomfort she tells me as well at this time. This is definitely unfortunate. No fevers, chills, nausea, or vomiting noted at this time. 09/08/18 on evaluation today patient's bilateral lower extremities actually appear to be shown signs of improvement which is good news. Fortunately there does not appear to be any signs of active infection I think the anabiotic is helping in this regard. Overall I'm very pleased with how she is progressing. 09/15/18 patient was actually seen in ER yesterday due to her legs as well unfortunately. She states she's been having a lot of pain and discomfort as well as a lot of drainage. Upon inspection today the patient does have a lot of swelling and drainage I feel like this is more related to lymphedema and poor fluid control than it is to infection based on what I'm seeing. The physician in the emergency department also doubted that the patient was having a significant infection nonetheless I see no evidence of infection obvious at this point although I do see evidence of poor fluid control. She still not using a compression pumps, she is not elevating due to her lift chair as well as her hospital bed being broken, and she really is not keeping her legs up as much as they should be and also has been taking off her wraps. All this  combined I think has led to poor fluid control and to be honest she may be somewhat volume overloaded in general as well. I recommend that she may need to contact your physician to see if a prescription for a diuretic would be beneficial in their opinion. As long as this is safe I think it would likely help her. 09/29/18 on evaluation today patient's left lower extremity actually appears to be doing quite a bit better. At least compared to last time that I saw her. She still has a large area where  she is draining from but there's a lot of new skin speckled trout and in fact there's more new skin that there are open areas of weeping and drainage at this point. This is good news. With regard to the right lower extremity this is doing much better with the only open area that I really see being a dry spot on the right lateral ankle currently. Fortunately there's no signs of active infection at this time which is good news. No fevers, chills, nausea, or vomiting noted at this time. The patient seems somewhat stressed and overwhelmed during the visit today she was very lethargic as such. She does and she is not taking any pain medications at this point. Apparently according to her husband are also in the process of moving which is probably taking its toll on her as well. 10/06/18 on evaluation today patient appears to be doing rather well in regard to her lower extremities compared to last evaluation. Fortunately there's no signs of active infection. She tells me she did have an appointment with her primary. Nonetheless he was concerned that the wounds were somewhat deep based on pictures but we never actually saw her legs. She states that he had her somewhat worried due to the fact that she was fearing now that she was [place] have to have an amputation. With that being said based on what I'm seeing check she looks better this week that she has the last two times I've seen her with much less drainage I'm  actually pleased in this regard. That doesn't mean that she's out of the water but again I do not think what the point of talking about education at all in regard to her leg. She is very happy to hear this. She is also not having as much pain as she was having last week. 10/13/18 unfortunately on evaluation today patient still continues to have a significant amount of drainage she's not letting home health actually apply the compression dressings at this point. She's trying to use of Juxta-Lite of the top of Kerlex and the second layer of the three layer compression wrap. With that being said she just does not seem to be making as good a progress as I would expect if she was having the compression applied and in place on a regular basis. No fevers, chills, nausea, or vomiting noted at this time. 10/20/18 on evaluation today patient appears to be doing a little better in regard to her bilateral lower extremity ulcers. In fact the right lower extremity seems to be healed she doesn't even have any openings at this point left lower extremity though still somewhat macerated seems to be showing signs of new skin growth at multiple locations throughout. Fortunately there's no evidence of active infection at this time. No fevers, chills, nausea, or vomiting noted at this time. 10/27/18 on evaluation today patient appears to be doing much better in regard to her left lower Trinity ulcer. She's been tolerating the laptop complication and has minimal drainage noted at this point. Fortunately there's no signs of active infection at this time. No fevers, chills, nausea, or vomiting noted at this time. 11/03/18 on evaluation today patient actually appears to be doing excellent in regard to her left lower extremity. She is having very little drainage at this point there does not appear to be any significant signs of infection overall very pleased with how things have gone. She is likewise extremely pleased still and seems  to be making wonderful progress week  to week. I do believe antibiotics were helpful for her. Her primary care provider did place on amateur clean since I last saw her. 11/17/18 on evaluation today patient appears to be doing worse in regard to her bilateral lower extremities at this point. She is been tolerating the dressing changes without complication. With that being said she typically takes the Coban off fairly quickly upon arriving home even after being seen here in the clinic and does not allow home health reapply command as part of the dressing at home. Therefore she said no compression essentially since I last saw her as best I can tell. With that being said I think it shows and how much swelling she has in the open wounds that are noted at this point. Fortunately there's no signs of infection but unfortunately if she doesn't get this under control I think she will end up with infection and more significant issues. 11/24/18 on evaluation today patient actually appears to be doing somewhat better in regard to her bilateral lower extremities. 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 Nonetheless that is doing better currently so she should be able to attempt reinitiating the lymphedema pumps at this time. No fevers, chills, nausea, or vomiting noted at this time. 12/01/18 upon evaluation today patient's left lower extremity appears to be doing quite well unfortunately her right lower extremity is not doing nearly as well. She has been tolerating the dressing changes without complication unfortunately she did not keep a wrap on the right at this time. Nonetheless I believe this has led to increased swelling and weeping in the world is actually much larger than during the last evaluation with her. 12/08/18 on evaluation today patient appears to be doing about  the same at this point in regard to her right lower extremity. There is some more palatable to touch I'm concerned about the possibility of there being some infection although I think the main issue is she's not keeping her compression wrap on which in turn is not allowing this area to heal appropriately. 12/22/18 on evaluation today patient appears to be doing better in regard to left lower extremity unfortunately significantly worse in regard to the right lower extremity. The areas of blistering and necrotic superficial tissue have spread and again this does not really appear to be signs of infection and all she just doesn't seem to be doing nearly as well is what she has been in the past. Overall I feel like the Augmentin did absolutely nothing for her she doesn't seem to have any infection again I really didn't think so last time either is more of a potential preventative measure and hoping that this would make some difference but I think the main issue is she's not wearing her compression. She tells me she cannot wear the [place] been we put on she takes it off pretty much upon getting home. Subsequently she worshiped Juxta-Lite when I questioned her about how often she wears it this is no more than three hours a day obviously that leaves 21 hours that she has no compression and this is obviously not doing well for her. Overall I'm concerned that if things continue to worsen she is at great risk of both infection as well as losing her leg. 01/05/19 on evaluation today patient appears to be doing  well in regard to her left lower extremity which he is allowing [place] to wrap and not so well with regard to her right lower extremity which she is not allowing [place] to really wrap and keep the wrap on. She states that it hurts too badly whenever it's wrapped and she ends up having to take it off. She's been using the Juxta-Lite she tells me up to six hours a day although I question whether or not that's really  been the case to be honest. Previously she told me three hours today nonetheless obviously the legs as long as the wrap is doing great when she is not is doing much more poorly. 01/12/2019 on evaluation today patient actually appears to be doing a little better in my opinion with regard to her right lower extremity ulcer. She has a small open area on the left lower extremity unfortunately but again this I think is part of the normal fluctuation of what she is going to have to expect with regard to her legs especially when she is not using her lymphedema pumps on a regular basis. Subsequently based on what I am seeing today I think that she does seem to be doing slightly better with regard to her right lower extremity she did see her primary care provider on Monday they felt she had an infection and placed her on 2 antibiotics. Both Cipro and clindamycin. Subsequently again she seems possibly to be doing a little bit better in regards to the right lower extremity she also tells me however she has been wearing the compression wrap over the past week since I spoke with her as well that is a Kerlix and Coban wrap on the right. No fevers, chills, nausea, vomiting, or diarrhea. 01/19/2019 on evaluation today patient appears to be doing better with regard to her bilateral lower extremities especially the right. I feel like the compression has been beneficial for her which is great news. She did get a call from her primary care provider on her way here today telling her that she did have methicillin- resistant Staphylococcus aureus and he was calling in a couple new antibiotics for her including a ointment to be applied she tells me 3 times a day. With that being said this sounds like likely to be Bactroban which I think could be applied with each dressing/wrap change but I would not be able to accommodate her applying this 3 times a day. She is in agreement with the least doing this we will add that to her orders  today. 01/26/2019 on evaluation today patient actually appears to be doing much better with regard to her right lower extremity. Her left lower extremity is also doing quite well all things considering. Fortunately there is no evidence of active infection at this time. No fevers, chills, nausea, vomiting, or diarrhea. 02/02/2019 on evaluation today patient appears to be doing much better compared to her last evaluation. Little by little off like her right leg is returning more towards normal. There does not appear to be any signs of active infection and overall she seems to be doing quite well which is great news. I am very pleased in this regard. No fevers, chills, nausea, vomiting, or diarrhea. 02/09/2019 upon evaluation today patient appears to be doing better with regard to her bilateral lower extremities. She has been tolerating the dressing changes without complication. Fortunately there is no signs of active infection at this time. No fevers, chills, nausea, vomiting, or diarrhea. 02/23/2019 on evaluation  today patient actually appears to be doing quite well with regard to her bilateral lower extremities. She has been tolerating the dressing changes without complication. She is even used her pumps one time and states that she really felt like it felt good. With that being said she seems to be in good spirits and her legs appear to be doing excellent. 03/09/2019 on evaluation today patient appears to be doing well with regard to her right lower extremity there are no open wounds at this time she is having some discomfort but I feel like this is more neuropathy than anything. With regard to her left lower extremity she had several areas scattered around that she does have some weeping and drainage from but again overall she does not appear to be having any significant issues and no evidence of infection at this time which is good news. 03/23/2019 on evaluation today patient appears to be doing well with  regard to her right lower extremity which she tells me is still close she is using her juxta light here. Her left lower extremity she mainly just has an area on the foot which is still slightly draining although this also is doing great. Overall very pleased at this time. 04/06/2019 patient appears to be doing a little bit worse in regard to her left lower extremity upon evaluation today. She feels like this could be becoming infected again which she had issues with previous. Fortunately there is no signs of systemic infection but again this is always a struggle with her with her legs she will go from doing well to not so well in a very short amount of time. 04/20/2019 on evaluation today patient actually appears to be doing quite well with regard to her right lower extremity I do not see any signs of active infection at this time. Fortunately there is no fever chills noted. She is still taking the antibiotics which I prescribed for her at this point. In regard to the left lower extremity I do feel like some of these areas are better although again she still is having weeping from several locations at this time. 04/27/2019 on evaluation today patient appears to be doing about the same if not slightly worse in regard to her left lower extremity ulcers. She tells me when questioned that she has been sleeping in her Hoveround chair in fact she tells me she falls asleep without even knowing it. I think she is spending a whole lot of time in the chair and less time walking and moving around which is not good for her legs either. On top of that she is in a seated position which is also the worst position she is not really elevating her legs and she is also not using her lymphedema pumps. All this is good to contribute to worsening of her condition in general. 05/18/2019 on evaluation today patient appears to be doing well with regard to her lower extremity on the right in fact this is showing no signs of any  open wounds at this time. On the left she is continuing to have issues with areas that do drain. Some of the regions have healed and there are couple areas that have reopened. She did go to the ER per the patient according to recommendations from the home health nurse due to what she was seen when she came out on 05/13/2019. Subsequently she felt like the patient needed to go to the hospital due to the fact that again she was having "  milky white discharge" from her leg. Nonetheless she had and then was placed on doxycycline and subsequently seems to be doing better. 06/01/2019 upon evaluation today patient appears to be doing really in my opinion about the same. I do not see any signs of active infection which is good news. Overall she still has wounds over the bilateral lower extremities she has reopened on the right but this appears to be more of a crack where there is weeping/edema coming from the region. I do not see any evidence of infection at either site based on what I visualized today. 07/13/2019 upon evaluation today patient appears to be doing a little worse compared to last time I saw her. She since has been in the hospital from 06/21/2019 through 06/29/2019. This was secondary to having Covid. During that time they did apply lotion to her legs which unfortunately has caused her to develop a myriad of open wounds on her lower extremities. Her legs do appear to be doing better as far as the overall appearance is concerned but nonetheless she does have more open and weeping areas. 07/27/2019 upon evaluation today patient appears to be doing more poorly to be honest in regard to her left lower extremity in particular. There is no signs of systemic infection although I do believe she may have local infection. She notes she has been having a lot of blue/green drainage which is consistent potentially with Pseudomonas. That may be something that we need to consider here as well. The doxycycline  does not seem to have been helping. 08/03/2019 upon evaluation today patient appears to be doing a little better in my opinion compared to last week's evaluation. Her culture I did review today and she is on appropriate medications to help treat the Enterobacter that was noted. Overall I feel like that is good news. With that being said she is unfortunately continuing to have a lot of drainage and though it is doing better I still think she has a long ways to go to get things dried up in general. Fortunately there is no signs of systemic infection. 08/10/2019 upon evaluation today patient appears to be doing may be slightly better in regard to her left lower extremity the right lower extremity is doing much better. Fortunately there is no signs of infection right now which is good news. No fevers, chills, nausea, vomiting, or diarrhea. 08/24/2019 on evaluation today patient appears to be doing slightly better in regard to her lower extremities. The left lower extremity seems to be healed the right lower extremity is doing better though not completely healed as far as the openings are concerned. She has some generalized issues here with edema and weeping secondary to her lymphedema though again I do believe this is little bit drier compared to prior weeks evaluations. In general I am very pleased with how things seem to be progressing. No fevers, chills, nausea, vomiting, or diarrhea. 08/31/2019 upon evaluation today patient actually seems to making some progress here with regard to the left lower extremity in particular. She has been tolerating the dressing changes without complication. Fortunately there is no signs of active infection at this time. No fevers, chills, nausea, vomiting, or diarrhea. She did see Dr. Doren Custard and he did note that she did have a issue with the left great saphenous vein and the small saphenous vein in the leg. With that being said he was concerned about the possibility of laser  ablation not being extremely successful. He also mentioned a small  risk of DVT associated with the procedure. However if the wounds do not continue to improve he stated that that would probably be the way to go. Fortunately the patient's legs do seem to be doing much better. 09/07/2019 upon evaluation today patient appears to be doing better with regard to her lower extremities. She has been tolerating the dressing changes without complication. With that being said she is showing signs of improvement and overall very pleased. There are some areas on her leg that I think we do need to debride we discussed this last week the patient is in agreement with doing that as long as it does not hurt too badly. 09/14/2019 upon evaluation today patient appears to be doing decently well with regard to her left lower extremity. She is not having near as much weeping as she has had in the past things seem to be drying up which is good news. There is no signs of active infection at this time. 09/21/19 upon evaluation today patient appears to be doing better in regard overall to her bilateral lower extremities. She again has less open than she did previous and each week I feel like this is getting better. Fortunately there is no signs of active infection at this time. No fevers, chills, nausea, vomiting, or diarrhea. 09/28/2019 upon evaluation today patient actually appears to be showing signs of improvement with regard to her left lower extremity. Unfortunately the right medial lower extremity around the ankle region has reopened to some degree but this appears to be minimal still which is good news. There is no signs of active infection at this time which is also good news. 10/12/2019 upon evaluation today patient appears to be doing okay with regard to her bilateral lower extremities today. The right is a little bit worse then last evaluation 2 weeks ago. The left is actually doing a little better in my opinion. Overall  there is no signs of active infection at this time that I see. Obviously that something we have to keep a close eye on she is very prone to this with the significant and multiple openings that she has over the bilateral lower extremities. 10/19/2019 upon evaluation today patient appears to be doing about the best that I have seen her in quite some time. She has been tolerating the dressing changes without complication. There does not appear to be any signs of active infection and overall I am extremely happy with the way her legs appeared. She is drying up quite nicely and overall is having less pain. 11/09/2019 upon evaluation today patient appears to be doing better in regard to her wounds. She seems to be drying up more and more each time I see her this is just taking a very long time. Fortunately there is no signs of active infection at this time. 11/23/2019 upon evaluation today patient actually appears to be doing excellent in regard to her lower extremities at this point compared to where she has been. Fortunately there is no signs of active infection at this time. She did go to the hospital last week for nausea and vomiting completely unrelated to her wounds. Fortunately she is doing better she was given some Reglan and got better. She had associated abdominal pain but they never found out what was going on. 12/07/2019 upon evaluation today patient appears to be doing well for the most part in regard to her legs. She unfortunately has not been keeping the [place] portion of her wraps on therefore the compression  has not really been sufficient for what it is supposed to be. Nonetheless she tells me that it just hurt too bad therefore she removed it. 12/21/2019 upon evaluation today patient actually appears to be doing quite well with regard to her legs. I do feel like she has been making progress which is great news and overall there is no signs of active infection at this time. No fevers, chills,  nausea, vomiting, or diarrhea. 01/04/2020 upon evaluation today patient presents for follow-up concerning her lower extremity edema bilaterally. She still has open wounds she has not been using her lymphedema pumps. She is also not been utilizing her compression wraps appropriately she tends to unwrap them, take them off, or states that they hurt. Obviously the reason they hurt is because her legs start to swell but the issue is if she would use her compression/lymphedema pumps regularly she would not swell and she would have the pain. Nonetheless she has not even picked them up once honestly over the past several months and may be even as much as in the past year based on my opinion and what have seen. She tells me today that after last week when I talked about this with her specifically actually that was 2 weeks ago that she "forgot". 8//21 on evaluation today patient appears to be doing a little better in regard to her legs bilaterally. Fortunately there is no signs of active infection at this time. She tells me that she used her lymphedema pumps all of one time over the past 2 weeks since I last saw her. She tells me that she has been too busy in order to continue to use these. 02/01/2020 on evaluation today patient appears to be doing some better in regard to her wounds in general in her legs. We felt the right was healed although is not completely it does appear to be doing better she tells me she has been using her lymphedema pumps that she has had this six times since I last saw her. Obviously the more she does that the better she would do my opinion 02/15/2020 upon evaluation today patient appears to be doing about the same in regard to her legs. She tells me that she is pumping I'm still not sure how much she does to be perfectly honest. However even if she does a little bit here and there I guess that is better than nothing. Fortunately there is no sign of active infection at this time which is  great news. No fevers, chills, nausea, vomiting, or diarrhea. 02/29/2020 on evaluation today patient actually appears to be doing quite well all things considered this week. She has been tolerating the dressing changes without complication. Fortunately there is no signs of active infection at this time. No fevers, chills, nausea, vomiting, or diarrhea. 03/14/2020 upon evaluation today patient appears to be doing really about the same in regard to her legs. There is no signs of improvement overall and she as noted from home health does not appear to be elevating her legs he can get into her lift chair. There is too much stuff piled up on it the patient tells me. She also tells me she cannot really use her pumps effectively due to the fact that she cannot have any space to get them on. Finally she is also not really elevating her legs because she is not sleeping in her bed she is sleeping in her chair currently and again overall I think everything that she is done  in combination has been exactly the wrong thing for what she needs for her legs. 04/04/2020 upon evaluation today patient appears to be doing well at this time with regard to her legs. She is actually been pumping, keeping her wraps on, and to be honest she seems to be doing dramatically better the right leg is excellent the left leg is also excellent and measuring much smaller than previous. 04/18/2020 upon evaluation today patient actually is continue to make good progress in regard to her lower extremities bilaterally. Everything is improving and less wet that has been in the past overall I am extremely pleased with where things stand and I think that she is making great progress. The patient tells me she still continue to use her compression pumps 05/02/2020 on evaluation today patient appears to be doing well at this time in regard to her left leg which is showing signs of drying up. With that being said she does have a lot of lymphedema type  crusty skin around the toes of her left foot and the right medial ankle which has opened at this point. Fortunately there is no signs of active infection systemically at this point or even locally for that matter. 05/23/2020 on evaluation today patient appears to be doing well with regard to her lower extremities. Fortunately there is no signs of active infection at this time. No fever chills noted. She has been very depressed however she tells me. 06/06/2020 patient came in today for evaluation in regard to her bilateral lower extremity ulcerations. With that being said she came in feeling okay and actually laughing and joking around with the staff checking her in. Subsequently however she had a coughing spell and following the coughing spell it was a dramatic conversion from being jovial and joking around to being extremely short of breath her vital signs actually dropped in regard to her blood pressure from around 175 to down around 829 for systolic and from around 96 diastolic down to around 70. With that being said she also accompanied this with an increase in her respiratory rate which was also quite significant. Nonetheless I actually upon going into see her was extremely worried we called EMS to have her transported to the ER for further evaluation and treatment. She continued until EMS got here to be extremely short of breath even on 6 L of oxygen. Her oxygen saturation did come up to 99% but overall it was only 95 before which was not terrible she did feel like the oxygen helped her feel somewhat better however. She has a history of asthma but again even the coughing spell really should not have done this degree of alteration in her demeanor from where she was just before to after the coughing spell. She tells [place] however she has been feeling somewhat abnormal since Friday although she could not really pinpoint exactly what was going on. 06/27/2020 plan evaluation today patient appears to be  doing okay in regard to her leg ulcers. She is really not showing a lot of improvement to be honest and she still has a lot of weeping. With that being said after I last saw her we called EMS to take her to the hospital she actually did not go she went to see her primary care provider who according to the patient have not identified and read through the note states that everything checked out okay. Nonetheless the patient has continued to have bouts where she gets very short of breath for seemingly no  reason whatsoever. That happened even the same night that I saw her last time. This was after she refused transport to the hospital. With that being said she also tells me that just getting dressed to come here is quite a chore which is putting on her shirt causing her to have to stop to catch her breath. Obviously this is not normal and I feel like there is something going on. She may need a referral to be seen by cardiology ASAP. She does see her primary care provider early next week she tells me 07/11/2020 upon evaluation today patient's wounds again appear to be doing about the same she mainly has weeping of the bilateral lower extremities both are very swollen today she tells me she has been using her pumps but last time I saw her she told me she did not even have her pumps out that she had "packed them away because they had too much stuff in their house. Apparently her husband has been bringing stuff in from storage units. She then subsequently told me that she had so much stuff in her house that she has been staying up all night and did not go to bed till 7:00 this morning because she was cleaning up stuff. Nonetheless I am still unsure as to whether or not she is using the pumps based on what I am seeing I would think probably not. Nonetheless she has been keeping the compression wraps in place that is at least something 07/25/2020 upon evaluation today patient appears to be doing well currently in  regard to her legs all things considered. I do not think she is doing any worse significantly although honestly I do not think she is doing a lot better either. There does not appear to be any signs of infection which is good news although she does have a lot of weeping. She is using her pumps she tells me sporadically though she says that her husband is not a big fan of helping her to get the [place]. She also tells me is difficult because in their house. It would be easiest for her to use these as where her son is actually staying with her and living out at this point. Obviously I understand that she is having a lot of issues here and to be honest I do not really know what to say in that regard and the fact that I really cannot change the situations but I do believe she really needs to be using the lymphedema pumps to try to keep things under control here. Outside of that I think she is going to continue to have significant issues. I think the pumps coupled with good compression and elevation are the only things that are to help her at this point. Objective Constitutional Obese and well-hydrated in no acute distress. Vitals Time Taken: 11:19 AM, Height: 62 in, Weight: 335 lbs, BMI: 61.3, Temperature: 97.8 F, Pulse: 101 bpm, Respiratory Rate: 17 breaths/min, Blood Pressure: 164/81 mmHg. Respiratory normal breathing without difficulty. Psychiatric this patient is able to make decisions and demonstrates good insight into disease process. Alert and Oriented x 3. pleasant and cooperative. General Notes: Upon evaluation today the drainage actually seems to be about the same in regard to the patient's legs currently. She does tell me the alginate however is getting stuck. For that reason I do think she may benefit from the switching to something such as XtraSorb which I think will do better as far as absorbing but at the  same time not allowing it to really stick to the legs and keeping some of the  moisture off of the legs themselves. Integumentary (Hair, Skin) Wound #61 status is Open. Original cause of wound was Gradually Appeared. The wound is located on the Left,Circumferential Lower Leg. The wound measures 11cm length x 30.6cm width x 0.1cm depth; 264.365cm^2 area and 26.437cm^3 volume. There is Fat Layer (Subcutaneous Tissue) exposed. There is no tunneling or undermining noted. There is a medium amount of serous drainage noted. The wound margin is flat and intact. There is large (67-100%) pink, pale granulation within the wound bed. There is no necrotic tissue within the wound bed. Wound #64 status is Open. Original cause of wound was Gradually Appeared. The wound is located on the Left,Dorsal Foot. The wound measures 7.2cm length x 6.3cm width x 0.1cm depth; 35.626cm^2 area and 3.563cm^3 volume. The wound is limited to skin breakdown. There is no tunneling or undermining noted. There is a medium amount of serosanguineous drainage noted. The wound margin is flat and intact. There is large (67-100%) pink, pale granulation within the wound bed. There is no necrotic tissue within the wound bed. Wound #67 status is Open. Original cause of wound was Gradually Appeared. The wound is located on the Right,Medial Lower Leg. The wound measures 5.7cm length x 7.7cm width x 0.1cm depth; 34.471cm^2 area and 3.447cm^3 volume. There is Fat Layer (Subcutaneous Tissue) exposed. There is no tunneling or undermining noted. There is a medium amount of serous drainage noted. The wound margin is thickened. There is large (67-100%) red granulation within the wound bed. There is no necrotic tissue within the wound bed. Wound #69 status is Open. Original cause of wound was Gradually Appeared. The wound is located on the Right,Dorsal Foot. The wound measures 2cm length x 2cm width x 0.1cm depth; 3.142cm^2 area and 0.314cm^3 volume. There is Fat Layer (Subcutaneous Tissue) exposed. There is no tunneling or  undermining noted. There is a medium amount of serous drainage noted. The wound margin is distinct with the outline attached to the wound base. There is large (67-100%) pink granulation within the wound bed. There is no necrotic tissue within the wound bed. 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 There was a Three Layer Compression Therapy Procedure by Rhae Hammock, RN. Post procedure Diagnosis Wound #61: Same as Pre-Procedure Wound #67 Pre-procedure diagnosis of Wound #67 is a Diabetic Wound/Ulcer of the Lower Extremity located on the Right,Medial Lower Leg . There was a Three Layer Compression Therapy Procedure by Rhae Hammock, RN. Post procedure Diagnosis Wound #67: Same as Pre-Procedure Plan Follow-up Appointments: Return Appointment in 2 weeks. Bathing/ Shower/ Hygiene: May shower and wash wound with soap and water. - with dressing changes, wash both legs with wash cloth and soap and water with dressing changes Edema Control - Lymphedema / SCD / Other: Lymphedema Pumps.  Use Lymphedema pumps on leg(s) 2-3 times a day for 45-60 minutes. If wearing any wraps or hose, do not remove them. Continue exercising as  instructed. Elevate legs to the level of the heart or above for 30 minutes daily and/or when sitting, a frequency of: Avoid standing for long periods of time. Exercise regularly Home Health: No change in wound care orders this week; continue Home Health for wound care. May utilize formulary equivalent dressing for wound treatment orders unless otherwise specified. Other Home Health Orders/Instructions: - [facility] WOUND #61: - Lower Leg Wound Laterality: Left, Circumferential Peri-Wound Care: Zinc Oxide Ointment 30g tube 2 x Per Week/30 Days Discharge Instructions: Apply Zinc Oxiide to weeping areas with each dressing change Peri-Wound Care: Sween Lotion (Moisturizing lotion) (Home Health) 2 x Per Week/30 Days Discharge Instructions: Apply moisturizing lotion as directed Secondary Dressing: Zetuvit Plus 4x4 in 2 x Per Week/30 Days Discharge Instructions: Apply over primary dressing as directed. Secondary Dressing: Zetuvit Plus 4x8 in 2 x Per Week/30 Days Discharge Instructions: or equivalent extra absorbent pad. Apply over primary dressing as directed. Com pression Wrap: ThreePress (3 layer compression wrap) (Home Health) 2 x Per Week/30 Days Discharge Instructions: Apply three layer compression as directed. Pad bend of ankle with foam or ABD pad. WOUND #64: - Foot Wound Laterality: Dorsal, Left Peri-Wound Care: Zinc Oxide Ointment 30g tube 2 x Per Week/30 Days Discharge Instructions: Apply Zinc Oxiide to weeping areas with each dressing change Peri-Wound Care: Sween Lotion (Moisturizing lotion) (Home Health) 2 x Per Week/30 Days Discharge Instructions: Apply moisturizing lotion as directed Secondary Dressing: Zetuvit Plus 4x4 in 2 x Per Week/30 Days Discharge Instructions: Apply over primary dressing as directed. Secondary Dressing: Zetuvit Plus 4x8 in 2 x Per Week/30 Days Discharge Instructions: or equivalent extra absorbent pad. Apply over primary dressing as directed. Com pression  Wrap: ThreePress (3 layer compression wrap) (Home Health) 2 x Per Week/30 Days Discharge Instructions: Apply three layer compression as directed. Pad bend of ankle with foam or ABD pad. WOUND #67: - Lower Leg Wound Laterality: Right, Medial Peri-Wound Care: Zinc Oxide Ointment 30g tube 2 x Per Week/30 Days Discharge Instructions: Apply Zinc Oxiide to weeping areas with each dressing change Peri-Wound Care: Sween Lotion (Moisturizing lotion) (Home Health) 2 x Per Week/30 Days Discharge Instructions: Apply moisturizing lotion as directed Secondary Dressing: Zetuvit Plus 4x4 in 2 x Per Week/30 Days Discharge Instructions: Apply over primary dressing as directed. Secondary Dressing: Zetuvit Plus 4x8 in 2 x Per Week/30 Days Discharge Instructions: or equivalent extra absorbent pad. Apply over primary dressing as directed. Com pression Wrap: ThreePress (3 layer compression wrap) (Home Health) 2 x Per Week/30 Days Discharge Instructions: Apply three layer compression as directed. Pad bend of ankle with foam or ABD pad. WOUND #69: - Foot Wound Laterality: Dorsal, Right Peri-Wound Care: Zinc Oxide Ointment 30g tube 2 x Per Week/30 Days Discharge Instructions: Apply Zinc Oxiide to weeping areas with each dressing change Peri-Wound Care: Sween Lotion (Moisturizing lotion) (Home Health) 2 x Per Week/30 Days Discharge Instructions: Apply moisturizing lotion as directed Secondary Dressing: Zetuvit Plus 4x4 in 2 x Per Week/30 Days Discharge Instructions: Apply over primary dressing as directed. Secondary Dressing: Zetuvit Plus 4x8 in 2 x Per Week/30 Days Discharge Instructions: or equivalent extra absorbent pad. Apply over primary dressing as directed. Compression Wrap: ThreePress (3 layer compression wrap) (Home Health) 2 x Per Week/30 Days Discharge Instructions: Apply three layer compression as directed. Pad bend of ankle with foam or ABD pad.  1. Would recommend currently we discontinued alginate  switching to the XtraSorb which I think will be better. We may use the tube it here but home health can use either. 2. I am also going to recommend the patient go ahead and continue with the elevation of her legs as much as she can she should also be using lymphedema pumps at least 2 times a day for an hour each time. 3. I am also can recommend that she really needs to focus on trying to get her legs better explained if she does not she is can end up having bigger issues when it comes to infection and again that can end up with her in the hospital even potentially septic. It is very important for her to take care of herself and I would recommend that she advocate for that with her family. We will see patient back for reevaluation in 2 weeks here in the clinic. If anything worsens or changes patient will contact our office for additional recommendations. Electronic Signature(s) Signed: 07/25/2020 2:08:22 PM By: [name] PA-C Entered By: [name] on 07/25/2020 14:08:22 -------------------------------------------------------------------------------- SuperBill Details Patient Name: Date of Service: [name] 07/25/2020 Medical Record Number: [id number] Patient Account Number: [id number] Date of Birth/Sex: Treating RN: 1951/05/19 (70 y.o. [name] Primary Care Provider: [name] Other Clinician: Referring Provider: Treating Provider/Extender: Darlen Round in Treatment: 155 Diagnosis Coding ICD-10 Codes Code Description E11.622 Type 2 diabetes mellitus with other skin ulcer I89.0 Lymphedema, not elsewhere classified I87.331 Chronic venous hypertension (idiopathic) with ulcer and inflammation of right lower extremity I87.332 Chronic venous hypertension (idiopathic) with ulcer and inflammation of left lower extremity L97.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 leg with fat layer exposed L97.822 Non-pressure chronic ulcer  of other part of left lower leg with fat layer exposed L97.522 Non-pressure chronic ulcer of other part of left foot with fat layer exposed I10 Essential (primary) hypertension E66.01 Morbid (severe) obesity due to excess calories F41.8 Other specified anxiety disorders R53.1 Weakness Facility Procedures The patient participates with Medicare or their insurance follows the Medicare Facility Guidelines: CPT4 Description Modifier Quantity Code [id number] BILATERAL: Application of multi-layer venous compression system; leg (below knee), including ankle and 1 foot. Physician Procedures Electronic Signature(s) Signed: 07/25/2020 2:08:38 PM By: [name] PA-C Entered By: [name] on 07/25/2020 14:08:35

## 2020-07-26 NOTE — Progress Notes (Signed)
[name], [name] ([id number]) Visit Report for 07/25/2020 Arrival Information Details Patient Name: Date of Service: [name], [name] 07/25/2020 11:00 A M Medical Record Number: [id number] Patient Account Number: [id number] Date of Birth/Sex: Treating RN: 06-Dec-1950 (70 y.o. Martyn Malay, Linda Primary Care Davis Ambrosini: [name] Other Clinician: Referring Storm Sovine: Treating Javarri Segal/Extender: Darlen Round in Treatment: 20 Visit Information History Since Last Visit Added or deleted any medications: No Patient Arrived: Wheel Chair Any new allergies or adverse reactions: No Arrival Time: 11:17 Had a fall or experienced change in No Accompanied By: self activities of daily living that may affect Transfer Assistance: None risk of falls: Patient Identification Verified: Yes Signs or symptoms of abuse/neglect since last visito No Secondary Verification Process Completed: Yes Hospitalized since last visit: No Patient Requires Transmission-Based Precautions: No Implantable device outside of the clinic excluding No Patient Has Alerts: Yes cellular tissue based products placed in the center Patient Alerts: R ABI= 1.01 since last visit: L ABI = .99 Has Dressing in Place as Prescribed: Yes Pain Present Now: Yes Electronic Signature(s) Signed: 07/26/2020 8:34:45 AM By: Sandre Kitty Entered By: Sandre Kitty on 07/25/2020 11:17:58 -------------------------------------------------------------------------------- Compression Therapy Details Patient Name: Date of Service: [name] Duke. 07/25/2020 11:00 A M Medical Record Number: [id number] Patient Account Number: [id number] Date of Birth/Sex: Treating RN: 06-21-1950 (70 y.o. [name] Primary Care Tira Lafferty: [name] Other Clinician: Referring Wynona Duhamel: Treating Aneliese Beaudry/Extender: Darlen Round in Treatment: 155 Compression Therapy Performed for Wound Assessment: Wound #67  Right,Medial Lower Leg Performed By: Clinician Rhae Hammock, RN Compression Type: Three Layer Post Procedure Diagnosis Same as Pre-procedure Electronic Signature(s) Signed: 07/25/2020 5:10:26 PM By: [name] RN, BSN Entered By: [name] on 07/25/2020 11:39:18 -------------------------------------------------------------------------------- Compression Therapy Details Patient Name: Date of Service: [name] Duke. 07/25/2020 11:00 A M Medical Record Number: [id number] Patient Account Number: [id number] Date of Birth/Sex: Treating RN: Nov 21, 1950 (70 y.o. [name] Primary Care Shiro Ellerman: [name] Other Clinician: Referring Bardia Wangerin: Treating Kemal Amores/Extender: Darlen Round in Treatment: 155 Compression Therapy Performed for Wound Assessment: Wound #61 Left,Circumferential Lower Leg Performed By: Clinician Rhae Hammock, RN Compression Type: Three Layer Post Procedure Diagnosis Same as Pre-procedure Electronic Signature(s) Signed: 07/25/2020 5:10:26 PM By: [name] RN, BSN Entered By: [name] on 07/25/2020 11:39:18 -------------------------------------------------------------------------------- Encounter Discharge Information Details Patient Name: Date of Service: [name] Duke. 07/25/2020 11:00 A M Medical Record Number: [id number] Patient Account Number: [id number] Date of Birth/Sex: Treating RN: [birth year] (70 y.o. [name], Lauren Primary Care Keysi Oelkers: [name] Other Clinician: Referring Lasha Echeverria: Treating Adar Rase/Extender: Darlen Round in Treatment: [phone] Encounter Discharge Information Items Discharge Condition: Stable Ambulatory Status: Wheelchair Discharge Destination: Home Transportation: Private Auto Accompanied By: self Schedule Follow-up Appointment: Yes Clinical Summary of Care: Patient Declined Electronic Signature(s) Signed: 07/25/2020 5:02:07 PM By: Rhae Hammock RN Entered By: Rhae Hammock on 07/25/2020 12:37:52 -------------------------------------------------------------------------------- Lower Extremity Assessment Details Patient Name: Date of Service: [name] Duke. 07/25/2020 11:00 A M Medical Record Number: [id number] Patient Account Number: [id number] Date of Birth/Sex: Treating RN: 04/13/1951 (70 y.o. [name], [name] Primary Care Monta Maiorana: [name] Other Clinician: Referring Kellyanne Ellwanger: Treating Tilly Pernice/Extender: Darlen Round in Treatment: 155 Edema Assessment Assessed: [name]: Yes] [name]: Yes] Edema: [Left: Yes] [Right: Yes] Calf Left: Right: Point of Measurement: 37 cm From Medial Instep 38.5 cm 38.5 cm Ankle Left: Right: Point of Measurement: 10 cm From Medial Instep 30.5 cm 24.2  cm Vascular Assessment Pulses: Dorsalis Pedis Palpable: [Left:No] Electronic Signature(s) Signed: 07/25/2020 4:51:20 PM By: Deon Pilling Entered By: Deon Pilling on 07/25/2020 11:30:45 -------------------------------------------------------------------------------- [place] Details Patient Name: Date of Service: [name] Duke. 07/25/2020 11:00 A M Medical Record Number: [id number] Patient Account Number: [id number] Date of Birth/Sex: Treating RN: 30-Jun-1950 (70 y.o. [name] Primary Care Zaidyn Claire: [name] Other Clinician: Referring Ezel Vallone: Treating Reida Hem/Extender: Darlen Round in Treatment: 155 Active Inactive Venous Leg Ulcer Nursing Diagnoses: Actual venous Insuffiency (use after diagnosis is confirmed) Knowledge deficit related to disease process and management Goals: Patient will maintain optimal edema control Date Initiated: 08/12/2017 Target Resolution Date: 08/22/2020 Goal Status: Active Patient/caregiver will verbalize understanding of disease process and disease management Date Initiated: 08/12/2017 Date Inactivated:  04/21/2018 Target Resolution Date: 04/24/2018 Goal Status: Met Interventions: Assess peripheral edema status every visit. Compression as ordered Treatment Activities: Therapeutic compression applied : 08/12/2017 Notes: Wound/Skin Impairment Nursing Diagnoses: Impaired tissue integrity Knowledge deficit related to ulceration/compromised skin integrity Goals: Patient/caregiver will verbalize understanding of skin care regimen Date Initiated: 08/12/2017 Target Resolution Date: 08/22/2020 Goal Status: Active Ulcer/skin breakdown will have a volume reduction of 30% by week 4 Date Initiated: 08/05/2017 Date Inactivated: 09/30/2017 Target Resolution Date: 10/03/2017 Goal Status: Met Ulcer/skin breakdown will have a volume reduction of 50% by week 8 Date Initiated: 09/30/2017 Date Inactivated: 10/28/2017 Target Resolution Date: 10/28/2017 Goal Status: Met Interventions: Assess patient/caregiver ability to perform ulcer/skin care regimen upon admission and as needed Assess ulceration(s) every visit Provide education on ulcer and skin care Screen for HBO Treatment Activities: Patient referred to home care : 08/05/2017 Skin care regimen initiated : 08/05/2017 Topical wound management initiated : 08/05/2017 Notes: Electronic Signature(s) Signed: 07/25/2020 5:10:26 PM By: [name] RN, BSN Entered By: [name] on 07/25/2020 11:20:51 -------------------------------------------------------------------------------- Pain Assessment Details Patient Name: Date of Service: [name] Duke. 07/25/2020 11:00 A M Medical Record Number: [id number] Patient Account Number: [id number] Date of Birth/Sex: Treating RN: 02-Oct-1950 (70 y.o. [name] Primary Care Demont Linford: [name] Other Clinician: Referring Wing Gfeller: Treating Naseem Varden/Extender: Darlen Round in Treatment: 155 Active Problems Location of Pain Severity and Description of Pain Patient Has Paino  Yes Site Locations Rate the pain. Current Pain Level: 6 Pain Management and Medication Current Pain Management: Electronic Signature(s) Signed: 07/25/2020 5:10:26 PM By: [name] RN, BSN Signed: 07/26/2020 8:34:45 AM By: Sandre Kitty Entered By: Sandre Kitty on 07/25/2020 11:19:57 -------------------------------------------------------------------------------- Patient/Caregiver Education Details Patient Name: Date of Service: [name], [name] 2/9/2022andnbsp11:00 A M Medical Record Number: [id number] Patient Account Number: [id number] Date of Birth/Gender: Treating RN: [birth year] (70 y.o. [name] Primary Care Physician: [name] Other Clinician: Referring Physician: Treating Physician/Extender: Darlen Round in Treatment: 59 Education Assessment Education Provided To: Patient Education Topics Provided Venous: Methods: Explain/Verbal Responses: Reinforcements needed, State content correctly Wound/Skin Impairment: Methods: Explain/Verbal Responses: State content correctly Electronic Signature(s) Signed: 07/25/2020 5:10:26 PM By: [name] RN, BSN Entered By: [name] on 07/25/2020 11:21:19 -------------------------------------------------------------------------------- Wound Assessment Details Patient Name: Date of Service: [name] Duke. 07/25/2020 11:00 A M Medical Record Number: [id number] Patient Account Number: [id number] Date of Birth/Sex: Treating RN: December 22, 1950 (70 y.o. [name] Primary Care Jala Dundon: [name] Other Clinician: Referring Shafin Pollio: Treating Fay Swider/Extender: Darlen Round in Treatment: 155 Wound Status Wound Number: 61 Primary Venous Leg Ulcer Etiology: Wound Location: Left, Circumferential Lower Leg Wound Open Wounding Event: Gradually Appeared Status:  Date Acquired: 04/20/2019 Comorbid Asthma, Hypertension, Peripheral Arterial Disease,  Peripheral Weeks Of Treatment: 66 History: Venous Disease, Type II Diabetes, Gout, Osteoarthritis Clustered Wound: Yes Wound Measurements Length: (cm) 11 Width: (cm) 30.6 Depth: (cm) 0.1 Clustered Quantity: 2 Area: (cm) 264.365 Volume: (cm) 26.437 % Reduction in Area: -3406.2% % Reduction in Volume: -3406.2% Epithelialization: Large (67-100%) Tunneling: No Undermining: No Wound Description Classification: Full Thickness Without Exposed Support Structures Wound Margin: Flat and Intact Exudate Amount: Medium Exudate Type: Serous Exudate Color: amber Foul Odor After Cleansing: No Slough/Fibrino Yes Wound Bed Granulation Amount: Large (67-100%) Exposed Structure Granulation Quality: Pink, Pale Fascia Exposed: No Necrotic Amount: None Present (0%) Fat Layer (Subcutaneous Tissue) Exposed: Yes Tendon Exposed: No Muscle Exposed: No Joint Exposed: No Bone Exposed: No Treatment Notes Wound #61 (Lower Leg) Wound Laterality: Left, Circumferential Cleanser Peri-Wound Care Zinc Oxide Ointment 30g tube Discharge Instruction: Apply Zinc Oxiide to weeping areas with each dressing change Sween Lotion (Moisturizing lotion) Discharge Instruction: Apply moisturizing lotion as directed Topical Primary Dressing Secondary Dressing Zetuvit Plus 4x4 in Discharge Instruction: Apply over primary dressing as directed. Zetuvit Plus 4x8 in Discharge Instruction: or equivalent extra absorbent pad. Apply over primary dressing as directed. Secured With Compression Wrap ThreePress (3 layer compression wrap) Discharge Instruction: Apply three layer compression as directed. Pad bend of ankle with foam or ABD pad. Compression Stockings Add-Ons Electronic Signature(s) Signed: 07/25/2020 4:51:20 PM By: Deon Pilling Entered By: Deon Pilling on 07/25/2020 11:29:28 -------------------------------------------------------------------------------- Wound Assessment Details Patient Name: Date of  Service: [name] Duke. 07/25/2020 11:00 A M Medical Record Number: [id number] Patient Account Number: [id number] Date of Birth/Sex: Treating RN: 02/08/1951 (70 y.o. [name], Meta.Reding Primary Care Christoffer Currier: [name] Other Clinician: Referring Necole Minassian: Treating Powell Halbert/Extender: Darlen Round in Treatment: 155 Wound Status Wound Number: 64 Primary Diabetic Wound/Ulcer of the Lower Extremity Etiology: Wound Location: Left, Dorsal Foot Wound Open Wounding Event: Gradually Appeared Status: Date Acquired: 06/01/2019 Comorbid Asthma, Hypertension, Peripheral Arterial Disease, Peripheral Weeks Of Treatment: 60 History: Venous Disease, Type II Diabetes, Gout, Osteoarthritis Clustered Wound: No Wound Measurements Length: (cm) 7.2 Width: (cm) 6.3 Depth: (cm) 0.1 Area: (cm) 35.626 Volume: (cm) 3.563 % Reduction in Area: -21491.5% % Reduction in Volume: -7171.4% Epithelialization: Large (67-100%) Tunneling: No Undermining: No Wound Description Classification: Grade 1 Wound Margin: Flat and Intact Exudate Amount: Medium Exudate Type: Serosanguineous Exudate Color: red, brown Foul Odor After Cleansing: No Slough/Fibrino Yes Wound Bed Granulation Amount: Large (67-100%) Exposed Structure Granulation Quality: Pink, Pale Fascia Exposed: No Necrotic Amount: None Present (0%) Fat Layer (Subcutaneous Tissue) Exposed: No Tendon Exposed: No Muscle Exposed: No Joint Exposed: No Bone Exposed: No Limited to Skin Breakdown Treatment Notes Wound #64 (Foot) Wound Laterality: Dorsal, Left Cleanser Peri-Wound Care Zinc Oxide Ointment 30g tube Discharge Instruction: Apply Zinc Oxiide to weeping areas with each dressing change Sween Lotion (Moisturizing lotion) Discharge Instruction: Apply moisturizing lotion as directed Topical Primary Dressing Secondary Dressing Zetuvit Plus 4x4 in Discharge Instruction: Apply over primary dressing as directed. Zetuvit  Plus 4x8 in Discharge Instruction: or equivalent extra absorbent pad. Apply over primary dressing as directed. Secured With Compression Wrap ThreePress (3 layer compression wrap) Discharge Instruction: Apply three layer compression as directed. Pad bend of ankle with foam or ABD pad. Compression Stockings Add-Ons Electronic Signature(s) Signed: 07/25/2020 4:51:20 PM By: Deon Pilling Entered By: Deon Pilling on 07/25/2020 11:28:44 -------------------------------------------------------------------------------- Wound Assessment Details Patient Name: Date of Service: [name] Duke. 07/25/2020 11:00 A M Medical Record Number: [id number]  Patient Account Number: [id number] Date of Birth/Sex: Treating RN: May 31, 1951 (70 y.o. [name], Meta.Reding Primary Care Tinamarie Przybylski: [name] Other Clinician: Referring Latavius Capizzi: Treating Mahir Prabhakar/Extender: Darlen Round in Treatment: 155 Wound Status Wound Number: 67 Primary Diabetic Wound/Ulcer of the Lower Extremity Etiology: Wound Location: Right, Medial Lower Leg Wound Open Wounding Event: Gradually Appeared Status: Date Acquired: 05/02/2020 Comorbid Asthma, Hypertension, Peripheral Arterial Disease, Peripheral Weeks Of Treatment: 12 History: Venous Disease, Type II Diabetes, Gout, Osteoarthritis Clustered Wound: No Wound Measurements Length: (cm) 5.7 Width: (cm) 7.7 Depth: (cm) 0.1 Area: (cm) 34.471 Volume: (cm) 3.447 % Reduction in Area: -2337.8% % Reduction in Volume: -2344.7% Epithelialization: Small (1-33%) Tunneling: No Undermining: No Wound Description Classification: Grade 1 Wound Margin: Thickened Exudate Amount: Medium Exudate Type: Serous Exudate Color: amber Foul Odor After Cleansing: No Slough/Fibrino No Wound Bed Granulation Amount: Large (67-100%) Exposed Structure Granulation Quality: Red Fascia Exposed: No Necrotic Amount: None Present (0%) Fat Layer (Subcutaneous Tissue) Exposed:  Yes Tendon Exposed: No Muscle Exposed: No Joint Exposed: No Bone Exposed: No Treatment Notes Wound #67 (Lower Leg) Wound Laterality: Right, Medial Cleanser Peri-Wound Care Zinc Oxide Ointment 30g tube Discharge Instruction: Apply Zinc Oxiide to weeping areas with each dressing change Sween Lotion (Moisturizing lotion) Discharge Instruction: Apply moisturizing lotion as directed Topical Primary Dressing Secondary Dressing Zetuvit Plus 4x4 in Discharge Instruction: Apply over primary dressing as directed. Zetuvit Plus 4x8 in Discharge Instruction: or equivalent extra absorbent pad. Apply over primary dressing as directed. Secured With Compression Wrap ThreePress (3 layer compression wrap) Discharge Instruction: Apply three layer compression as directed. Pad bend of ankle with foam or ABD pad. Compression Stockings Add-Ons Electronic Signature(s) Signed: 07/25/2020 4:51:20 PM By: Deon Pilling Entered By: Deon Pilling on 07/25/2020 11:30:04 -------------------------------------------------------------------------------- Wound Assessment Details Patient Name: Date of Service: [name], [name] 07/25/2020 11:00 A M Medical Record Number: [id number] Patient Account Number: [id number] Date of Birth/Sex: Treating RN: 12-01-1950 (70 y.o. [name], Meta.Reding Primary Care Zaiya Annunziato: [name] Other Clinician: Referring Maicey Barrientez: Treating Rikayla Demmon/Extender: Darlen Round in Treatment: 155 Wound Status Wound Number: 69 Primary Lymphedema Etiology: Wound Location: Left, Dorsal Foot Secondary Diabetic Wound/Ulcer of the Lower Extremity Wounding Event: Gradually Appeared Etiology: Date Acquired: 07/25/2020 Wound Open Weeks Of Treatment: 0 Status: Clustered Wound: No Comorbid Asthma, Hypertension, Peripheral Arterial Disease, Peripheral History: Venous Disease, Type II Diabetes, Gout, Osteoarthritis Wound Measurements Length: (cm) 2 Width: (cm) 2 Depth: (cm)  0.1 Area: (cm) 3.142 Volume: (cm) 0.314 % Reduction in Area: % Reduction in Volume: Epithelialization: Small (1-33%) Tunneling: No Undermining: No Wound Description Classification: Full Thickness Without Exposed Support Structures Wound Margin: Distinct, outline attached Exudate Amount: Medium Exudate Type: Serous Exudate Color: amber Foul Odor After Cleansing: No Slough/Fibrino No Wound Bed Granulation Amount: Large (67-100%) Exposed Structure Granulation Quality: Pink Fascia Exposed: No Necrotic Amount: None Present (0%) Fat Layer (Subcutaneous Tissue) Exposed: Yes Tendon Exposed: No Muscle Exposed: No Joint Exposed: No Bone Exposed: No Electronic Signature(s) Signed: 07/25/2020 4:51:20 PM By: Deon Pilling Entered By: Deon Pilling on 07/25/2020 11:27:36 -------------------------------------------------------------------------------- Vitals Details Patient Name: Date of Service: [name] Duke. 07/25/2020 11:00 A M Medical Record Number: [id number] Patient Account Number: [id number] Date of Birth/Sex: Treating RN: Nov 01, 1950 (70 y.o. [name] Primary Care Ethel Meisenheimer: [name] Other Clinician: Referring Zykeria Laguardia: Treating Zayan Delvecchio/Extender: Darlen Round in Treatment: 155 Vital Signs Time Taken: 11:19 Temperature (F): 97.8 Height (in): 62 Pulse (bpm): 101 Weight (lbs): 335 Respiratory Rate (breaths/min):  17 Body Mass Index (BMI): 61.3 Blood Pressure (mmHg): 164/81 Reference Range: 80 - 120 mg / dl Electronic Signature(s) Signed: 07/26/2020 8:34:45 AM By: Sandre Kitty Signed: 07/26/2020 8:34:45 AM By: Sandre Kitty Entered By: Sandre Kitty on 07/25/2020 11:19:48

## 2020-08-08 ENCOUNTER — Encounter (HOSPITAL_BASED_OUTPATIENT_CLINIC_OR_DEPARTMENT_OTHER): Payer: Medicare PPO | Admitting: Physician Assistant

## 2020-08-08 ENCOUNTER — Other Ambulatory Visit (HOSPITAL_COMMUNITY)
Admission: RE | Admit: 2020-08-08 | Discharge: 2020-08-08 | Disposition: A | Discharge status: Home / Self Care | Payer: Medicare PPO | Source: Other Acute Inpatient Hospital | Attending: Physician Assistant | Admitting: Physician Assistant

## 2020-08-08 ENCOUNTER — Other Ambulatory Visit: Payer: Self-pay

## 2020-08-08 DIAGNOSIS — L03115 Cellulitis of right lower limb: Secondary | ICD-10-CM | POA: Diagnosis present

## 2020-08-08 DIAGNOSIS — E11622 Type 2 diabetes mellitus with other skin ulcer: Secondary | ICD-10-CM | POA: Diagnosis not present

## 2020-08-08 NOTE — Progress Notes (Addendum)
[name], WANAMAKER ([id number]) Visit Report for 08/08/2020 Chief Complaint Document Details Patient Name: Date of Service: [name], SCHEIBER 08/08/2020 10:30 A M Medical Record Number: [id number] Patient Account Number: [id number] Date of Birth/Sex: Treating RN: 20-May-1951 (70 y.o. [name] Primary Care Provider: [name] Other Clinician: Referring Provider: Treating Provider/Extender: Darlen Round in Treatment: 157 Information Obtained from: Patient Chief Complaint Bilateral reoccurring LE ulcers Electronic Signature(s) Signed: 08/08/2020 11:20:28 AM By: [name] PA-C Entered By: [name] on 08/08/2020 11:20:27 -------------------------------------------------------------------------------- HPI Details Patient Name: Date of Service: [name]. 08/08/2020 10:30 A M Medical Record Number: [id number] Patient Account Number: [id number] Date of Birth/Sex: Treating RN: 02-28-1951 (70 y.o. [name] Primary Care Provider: [name] Other Clinician: Referring Provider: Treating Provider/Extender: Darlen Round in Treatment: 157 History of Present Illness HPI Description: this patient has been seen a couple of times before and returns with recurrent problems to her right and left lower extremity with swelling and weeping ulcerations due to not wearing her compression stockings which she had been advised to do during her last discharge, at the end of June 2018. During her last visit the patient had had normal arterial blood flow and her venous reflux study did not necessitate any surgical intervention. She was recommended compression and elevation and wound care. After prolonged treatment the patient was completely healed but she has been noncompliant with wearing or compressions.. She was here last week with an outpatient return visit planned but the patient came in a very poor general condition with altered  mental status and was rushed to the ER on my request. With a history of hypertension, diabetes, TIA and right-sided weakness she was set up for an MRI on her brain and cervical spine and was sent to [facility]. Getting an MRI done was very difficult but once the workup was done she was found not to have any spinal stenosis, epidural abscess or hematoma or discitis. This was radiculopathy to be treated as an outpatient and she was given a follow-up appointment. Today she is feeling much better alert and oriented and has come to reevaluate her bilateral lower extremity lymphedema and ulceration 03/25/2017 -- she was admitted to the hospital on 03/16/2017 and discharged on 03/18/2017 with left leg cellulitis and ulceration. She was started on vancomycin and Zosyn and x-ray showed no bony involvement. She was treated for a cellulitis with IV antibiotics changed to Rocephin and Flagyl and was discharged on oral Keflex and doxycycline to complete a 7 day course. Last hemoglobin A1c was 7.1 and her other ailments including hypertension got asthma were appropriately treated. 05/06/2017 -- she is awaiting the right size of compression stockings from [employer] but other than that has been doing well. ====== Old notes 70 year old patient was seen one time last October and was lost to follow-up. She has recurrent problems with weeping and ulceration of her left lower extremity and has swelling of this for several years. It has been worse for the last 2 months. Past medical history is significant for diabetes mellitus type 2, hypertension, gout, morbid obesity, depressive disorders, hiatal hernia, migraines, status post knee surgery, risk of a cholecystectomy, vaginal hysterectomy and breast biopsy. She is not a smoker. As noted before she has never had a venous duplex study and an arterial ABI study was attempted but the left lower extremity was noncompressible 10/01/2016 -- had a lower extremity venous duplex  reflux evaluation which showed  no evidence of deep vein reflux in the right or left lower extremity, and no evidence of great saphenous vein reflux more than 500 ms in the right or left lower extremity, and the left small saphenous vein is incompetent but no vascular consult was recommended. review of her electronic medical records noted that the ABI was checked in July 2017 where the right ABI was normal limits and the left ABI could not be ascertained due to pain with cuff pressure but the waveforms are within normal limits. her arterial duplex study scheduled for April 27. 10/08/2016 -- the patient has various reasons for not having a compression on and for the last 3 days she has had no compression on her left lower extremity either due to pain or the lack of nursing help. She does not use her juxta lites either. 10/15/2016 -- the patient did not keep her appointment for arterial duplex study on April 27 and I have asked her to reschedule this. Her pain is out of proportion with the physical findings and she continuously fails to wear a compression wraps and cuts them off because she says she cannot tolerate the pain. She does not use her juxta lites either. 10/22/2016 -- he has rescheduled her arterial duplex study to May 21 and her pain today is a bit better. She has not been wearing her juxta lites on her right lower extremity but now understands that she needs to do this. She did tolerate the to press compression wrap on her left lower extremity 10/29/2016 --arterial duplex study is scheduled for next week and overall she has been tolerating her compression wraps and also using her juxta lites on her right lower extremity 11/05/2016 -- the right ABI was 0.95 the left was 1.03. The digit TBI is on the right was 0.83 on the left was 0.92 and she had biphasic flow through these vessels. The impression was that of normal lower extremity arterial study. 11/12/2016 -- her pain is minimal and she  is doing very well overall. 11/26/2016 -- she has got juxta lites and her insurance will not pay for additional dual layer compression stockings. She is going to order some from [place]. 05/12/2017 -- her juxta lites are very old and too big for her and these have not been helping with compression. She did get 20-30 mm compression stockings from [employer] but she and her husband are unable to put these on. I believe she will benefit from bilateral Extremit-ease, compression stockings and we will measure her for these today. 05/20/2017 -- lymphedema on the left lower extremity has increased a lot and she has a open ulceration as a result of this. The right lower extremity is looking pretty good. She has decided to by the compression stockings herself and will get reimbursed by the home health, at a later date. 05/27/2017 -- her sciatica is bothering her a lot and she thought her left leg pain was caused due to the compression wrap and hence removed it and has significant lymphedema. There is no inflammation on this left lower extremity. 06/17/17 on evaluation today patient appears to be doing very well and in fact is completely healed in regard to her ulcerations. Unfortunately however she does have continued issues with lymphedema nonetheless. We did order compression garments for her unfortunately she states that the size that she received were large although we ordered medium. Obviously this means she is not getting the optimal compression. She does not have those with her today and therefore  we could not confirm and contact the company on her behalf. Nonetheless she does state that she is going to have her husband bring them by tomorrow so that we can verify and then get in touch with the company. No fevers, chills, nausea, or vomiting noted at this time. Overall patient is doing better otherwise and I'm pleased with the progress she has made. 07/01/17 on evaluation today patient appears to be doing  very well in regard to her bilateral lower extremity she does not have any openings at this point which is excellent news. Overall I'm pleased with how things have progressed up to this time. Since she is doing so well we did order her compression which we are seeing her today to ensure that it fits her properly and everything is doing well in that regard and then subsequently she will be discharged. ============ Old Notes: 03/31/16 patient presents today for evaluation concerning open wounds that she has over the left medial ankle region as well as the left dorsal foot. She has previously had this occur although it has been healed for a number of months after having this for about a year prior until her hospitalization on 01/05/16. At that point in time it appears that she was admitted to the hospital for left lower extremity cellulitis and was placed on vancomycin and Zosyn at that point. Eventually upon discharge on January 15, 2016 she was placed on doxycycline at that point in time. Later on 03/27/16 positive wound culture growing Escherichia coli this was switched to amoxicillin. Currently she tells me that she is having pain radiated to be a 7 out of 10 which can be as high as 10 out of 10 with palpation and manipulation of the wound. This wound appears to be mainly venous in nature due to the bilateral lower extremity venous stasis/lymphedema. This is definitely much worse on her left than the right side. She does have type 1 diabetes mellitus, hypertension, morbid obesity, and is wheelchair dependent.during the course of the hospital stay a blood culture was also obtained and fortunately appeared negative. She also had an x-ray of the tibia/fibula on the left which showed no acute bone abnormality. Her white blood cell count which was performed last on 03/25/16 was 7.3, hemoglobin 12.8, protein 7.1, albumin 3.0. Her urine culture appeared to be negative for any specific organisms. Patient did  have a left lower extremity venous duplex evaluation for DVT . This did not include venous reflux studies but fortunately was negative for DVT Patient also had arterial studies performed which revealed that she had a . normal ABI on the right though this was unable to be performed on the left secondary to pain that she was having around the ankle region due to the wound. However it was stated on report that she had biphasic pulses and apparently good blood flow. ========== 06/03/17 she is here in follow-up evaluation for right lower extremity ulcer. The right lower sure he has healed but she has reopened to the left medial malleolus and dorsal foot with weeping. She is waiting for new compression garments to arrive from home health, the previous compression garments were ill fitting. We will continue with compression bilaterally and follow-up in 2 weeks Readmission: 08/05/17 on evaluation today patient appears to be doing somewhat poorly in regard to her left lower extremity especially although the right lower extremity has a small area which may no longer be open. She has been having a lot of drainage from the  left lower extremity however he tells me that she has not been able to use the EXTREMIT-EASE Compression at this point. She states that she did better and was able to actually apply the Juxta-Lite compression although the wound that she has is too large and therefore really does not compress which is why she cannot wear it at this point. She has no one who can help her put it on regular basis her son can sometimes but he's not able to do it most of the time. I do believe that's why she has begun to weave and have issues as she is currently yet again. No fevers, chills, nausea, or vomiting noted at this time. Patient is no evidence of dementia. 08/12/17 on evaluation today patient appears to still be doing fairly well in regard to the draining areas/weeping areas at this point. With that being  said she unfortunately did go to the ER yesterday due to what was felt to be possibly a cellulitis. They place her on doxycycline by mouth and discharge her home. She definitely was not admitted. With that being said she states she has had more discomfort which has been unusual for her even compared to prior times and she's had infections.08/12/17 on evaluation today patient appears to still be doing fairly well in regard to the draining areas/weeping areas at this point. With that being said she unfortunately did go to the ER yesterday due to what was felt to be possibly a cellulitis. They place her on doxycycline by mouth and discharge her home. She definitely was not admitted. With that being said she states she has had more discomfort which has been unusual for her even compared to prior times and she's had infections. 08/19/17 put evaluation today patient tells me that she's been having a lot of what sounds to be neuropathic type pain in regard to her left lower extremity. She has been using over-the-counter topical bins again which some believe. That in order to apply the she actually remove the wrap we put on her last Wednesday on Thursday. Subsequently she has not had anything on compression wise since that time. The good news is a lot of the weeping areas appear to have closed at this point again I believe she would do better with compression but we are struggling to get her to actually use what she needs to at this point. No fevers, chills, nausea, or vomiting noted at this time. 09/03/17 on evaluation today patient appears to be doing okay in regard to her lower extremities in regard to the lymphedema and weeping. Fortunately she does not seem to show any signs of infection at this point she does have a little bit of weeping occurring in the right medial malleolus area. With that being said this does not appear to be too significant which is good news. 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 She has severe skin damage secondary to both of these features involving the dorsal left foot and medial left ankle and lower leg. Still has open areas in the left anterior foot. The area on the right closed over. She uses her own juxta light stockings. She does not have an arterial issue 09/16/17 on evaluation today patient actually appears to be doing excellent in regard to her bilateral lower extremity swelling. The Juxta-Lite compression wrap seem to be doing very well for her. She has not however been using the portion that goes over her foot.  Her left foot still is draining a little bit not nearly as significant as it has been in the past but still I do believe that she likely needs to utilize the full wrap including the foot portion of this will improve as well. She also has been apparently putting on a significant amount of Vaseline which also think is not helpful for her. I recommended that if she feels she needs something for moisturizer Eucerin will probably be better. 09/30/17 on evaluation today patient presents with several new open areas in regard to her left lower extremity although these appear to be minimal and mainly seem to be more moisture breakdown than anything. Fortunately she does not seem to have any evidence of infection which is great news. She has been tolerating the dressing changes without complication we are using silver alginate on the foot she has been using AB pads to have the legs and using her Juxta- Lite compression which seems to be controlling her swelling very well. Overall I'm pleased with the poor way she has progressed. 10/14/17 on evaluation today patient appears to be doing better in regard to her left lower extremity areas of weeping. She does still have some discomfort although in general this does not appear to be as macerated and I think it is progressing nicely. I do think she still  needs to wear the foot portion of her Juxta- Lite in order to get the most benefit from the wrap obviously. She states she understands. Fortunately there does not appear to be evidence of infection at this time which is great news. 10/28/17 on evaluation today patient appears to be doing excellent in regard to her left lower extremity. She has just a couple areas that are still open and seem to be causing any trouble whatsoever. For that reason I think that she is definitely headed in the right direction the spots are very tiny compared to what we have been dealing with in the past. 11/11/17 on evaluation today patient appears to have a right lateral lower extremity ulcer that has opened since I last saw her. She states this is where the home health nurse that was coming out remove the dressing without wetting the alginate first. Nonetheless I do not know if this is indeed the case or not but more importantly we have not ordered home help to be coming out for her wounds at all. I'm unsure as to why they are coming out and we're gonna have to check on this and get things situated in that regard. With that being said we currently really do not need them to be coming out as the patient has been taking care of her leg herself without complication and no issues. In fact she was doing much better prior to nursing coming out. 11/25/17 on evaluation today patient actually appears to be doing fairly well in regard to her left lower extremity swelling. In fact she has very little area of weeping at this point there's just a small spot on the lateral portion of her right leg that still has me just a little bit more concerned as far as wanting to see this clear up before I discharge her to caring for this at home. Nonetheless overall she has made excellent progress. 12/09/17 on evaluation today patient appears to be doing rather well in regard to her lower extremity edema. She does have some weeping still in the left  lower extremity although the big area we were taking care of two  weeks ago actually has closed and she has another area of weeping on the left lower extremity immediately as well is the top of her foot. She does not currently have lymphedema pumps she has been wearing her compression daily on a regular basis as directed. With that being said I think she may benefit from lymphedema pumps. 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 We had a very difficult time getting and keeping this under control. 12/23/17 on evaluation today patient actually appears to be doing a little bit more poorly in regard to her bilateral lower extremities. She has been tolerating the Juxta-Lite compression wraps. Unfortunately she has two new ulcers on the right lower extremity and left lower Trinity ulceration seems to be larger. Obviously this is not good news. She has been tolerating the dressings without complication. 12/30/17 on evaluation today patient actually appears to be doing much better in regard to her bilateral lower extremity edema. She continues to have some issues with ulcerations and in fact there appears to be one spot on each leg where the wrap may have caused a little bit of a blister which is subsequently opened up at this point is given her pain. Fortunately it does not appear to be any evidence of infection which is good news. No fevers chills noted. 01/13/18 on evaluation today patient appears to be doing rather well in regard to her bilateral lower extremities. The dressings did get kind of stuck as far as the wound beds are concerned but again I think this is mainly due to the fact that she actually seems to be showing signs of healing which is good news. She's not having as much drainage therefore she was having more of the dressing sticking.  Nonetheless overall I feel like her swelling is dramatically down compared to previous. 01/20/18 on evaluation today patient unfortunately though she's doing better in most regards has a large blister on the left anterior lower extremity where she is draining quite significantly. Subsequently this is going to need debridement today in order to see what's underneath and ensure she does not continue to trapping fluid at this location. Nonetheless No fevers, chills, nausea, or vomiting noted at this time. 01/27/18 on evaluation today patient appears to be doing rather well at this point in regard to her right lower extremity there's just a very small area that she still has open at this point. With that being said I do believe that she is tolerating the compression wraps very well in making good progress. Home health is coming out at this point to see her. Her left lower extremity on the lateral portion is actually what still mainly open and causing her some discomfort for the most part 02/10/18 on evaluation today patient actually appears to be doing very well in regard to her right lower extremity were all the ulcers appear to be completely close. In regard to the left lower extremity she does have two areas still open and some leaking from the dorsal surface of her foot but this still seems to be doing much better to me in general. 02/24/18 on evaluation today patient actually appears to be doing much better in regard to her right lower extremity this is still completely healed. Her left lower extremity is also doing much better fortunately she has no evidence of infection. The one  area that is gonna require some debridement is still on the left anterior shin. Fortunately this is not hurting her as badly today. 03/10/18 on evaluation today patient appears to be doing better in some regards although she has a little bit more open area on the dorsal foot and she also has some issues on the medial portion of  the left lower extremity which is actually new and somewhat deep. With that being said there fortunately does not appear to be any significant signs of infection which is good news. No fevers, chills, nausea, or vomiting noted at this time. In general her swelling seems to be doing fairly well which is good news. 03/31/18 on evaluation today patient presents for follow-up concerning her left lower extremity lymphedema. Unfortunately she has been doing a little bit more poorly since I last saw her in regard to the amount of weeping that she is experiencing. She's also having some increased pain in the anterior shin location. Unfortunately I do not feel like the patient is making such good progress at this point a few weeks back she was definitely doing much better. 04/07/18 on evaluation today patient actually appears to be showing some signs of improvement as far as the left lower extremity is concerned. She has been tolerating the dressing changes and it does appear that the Drawtex did better for her. With that being said unfortunately home health is stating that they cannot obtain the Drawtex going forward. Nonetheless we're gonna have to check and see what they may be able to get the alginate they were using was getting stuck in causing new areas of skin being pulled all that with and subsequently weep and calls her to worsen overall this is the first time we've seen improvement at this time. 04/14/18 on evaluation today patient actually appears to be doing rather well at this point there does not appear to be any evidence of infection at this time and she is actually doing excellent in regard to the weeping in fact she almost has no openings remaining even compared to just last week this is a dramatic improvement. No fevers chills noted 04/21/18 evaluation today patient actually appears to be doing very well. She in fact is has a small area on the posterior lower extremity location and she has  a small area on the dorsal surface of her foot that are still open both of which are very close to closing. We're hoping this will be close shortly. She brought her Juxta-Lite wrap with her today hoping that would be able to put her in it unfortunately I don't think were quite at that point yet but we're getting closer. 04/28/18 upon evaluation today patient actually appears to be doing excellent in regard to her left lower extremity ulcer. In fact the region on the posterior lower extremity actually is much smaller than previously noted. Overall I'm very happy with the progress she has made. She again did bring her Juxta-Lite although we're not quite ready for that yet. 05/11/18 upon evaluation today patient actually appears to be doing in general fairly well in regard to her left lower Trinity. The swelling is very well controlled. With that being said she has a new area on the left anterior lower extremity as well as between the first and second toes of her left foot that was not present during the last evaluation. The region of her posterior left lower extremity actually appears to be almost completely healed. T be honest I'm very pleased  with o the way that stands. Nonetheless I do believe that the lotion may be keeping the area to moist as far as her legs are concerned subsequently I'm gonna consider discontinuing that today. 05/26/18 on evaluation today patient appears to be doing rather well in regard to her left lower should be ulcers. In fact everything appears to be close except for a very small area on the left posterior lower extremity. Fortunately there does not appear to be any evidence of infection at this time. Overall very pleased with her progress. 06/02/18 and evaluation today patient actually appears to be doing very well in regard to her lower extremity ulcers. She has one small area that still continues to weep that I think may benefit her being able to justify lotion and user  Juxta-Lite wraps versus continued to wrap her. Nonetheless I think this is something we can definitely look into at this point. 06/23/18 on evaluation today patient unfortunately has openings of her bilateral lower extremities. In general she seems to be doing much worse than when I last saw her just as far as her overall health standpoint is concerned. She states that her discomfort is mainly due to neuropathy she's not having any other issues otherwise. No fevers, chills, nausea, or vomiting noted at this time. 06/30/18 on evaluation today patient actually appears to be doing a little worse in regard to her right lower extremity her left lower extremity of doing fairly well. Fortunately there is no sign of infection at this time. She has been tolerating the dressing changes without complication. Home health did not come out like they were supposed to for the appropriate wrap changes. They stated that they never received the orders from [place] which were fax. Nonetheless we will send a copy of the orders with the patient today as well. 07/07/18 on evaluation today patient appears to be doing much better in regard to lower extremities. She still has several openings bilaterally although since I last saw her her legs did show obvious signs of infection when she later saw her nurse. Subsequently a culture was obtained and she is been placed on Bactrim and Keflex. Fortunately things seem to be looking much better it does appear she likely had an infection. Again last week we'd even discussed it but again there really was not any obvious sign that she had infection therefore we held off on the antibiotics. Nonetheless I'm glad she's doing better today. 07/14/18 on evaluation today patient appears to be doing much better regarding her bilateral lower Trinity's. In fact on the right lower for me there's nothing open at this point there are some dry skin areas at the sites where she had infection. Fortunately there is  no evidence of systemic infection which is excellent news. No fevers chills noted 07/21/18 on evaluation today patient actually appears to be doing much better in regard to her left lower extremity ulcers. She is making good progress and overall I feel like she's improving each time I see her. She's having no pain I do feel like the infection is completely resolved which is excellent news. No fevers, chills, nausea, or vomiting noted at this time. 07/28/18 on evaluation today patient appears to be doing very well in regard to her left lower [employer]. Everything seems to be showing signs of improvement which is excellent news. Overall very pleased with the progress that has been made. Fortunately there's no evidence of active infection at this time also excellent news. 08/04/18 on evaluation  today patient appears to be doing more poorly in regard to her bilateral lower extremities. She has two new areas open up on the right and these were completely closed as of last week. She still has the two spots on the left which in my pinion seem to be doing better. Fortunately there's no evidence of infection again at this point. 08/11/18 on evaluation today patient actually appears to be doing very well in regard to her bilateral lower Trinity wounds that all seem to be doing better and are measures smaller today. Fortunately there's no signs of infection. No fevers, chills, nausea, or vomiting noted at this time. 08/18/18 on evaluation today patient actually appears to be doing about the same inverter bilateral lower extremities. She continues to have areas that blistering open as was drain that fortunately nothing too significant. Overall I feel like Drawtex may have done better for her however compared to the collagen. 08/25/18 on evaluation today patient appears to be doing a little bit more poorly today even compared to last time I saw her. Again I'm not exactly sure why she's making worse progress over  the past several weeks. I'm beginning to wonder if there is some kind of underlying low level infection causing this issue. I did actually take a culture from the left anterior lower extremity but it was a new wound draining quite a bit at this point. Unfortunately she also seems to be having more pain which is what also makes me worried about the possibility of infection. This is despite never erythema noted at this point. 09/01/18 on evaluation today patient actually appears to be doing a little worse even compared to last week in regard to bilateral lower extremities. She did go to the hospital on the 16th was given a dose of IV Zosyn and then discharged with a recommendation to continue with the Bactrim that I previously prescribed for her. Nonetheless she is still having a lot of discomfort she tells me as well at this time. This is definitely unfortunate. No fevers, chills, nausea, or vomiting noted at this time. 09/08/18 on evaluation today patient's bilateral lower extremities actually appear to be shown signs of improvement which is good news. Fortunately there does not appear to be any signs of active infection I think the anabiotic is helping in this regard. Overall I'm very pleased with how she is progressing. 09/15/18 patient was actually seen in ER yesterday due to her legs as well unfortunately. She states she's been having a lot of pain and discomfort as well as a lot of drainage. Upon inspection today the patient does have a lot of swelling and drainage I feel like this is more related to lymphedema and poor fluid control than it is to infection based on what I'm seeing. The physician in the emergency department also doubted that the patient was having a significant infection nonetheless I see no evidence of infection obvious at this point although I do see evidence of poor fluid control. She still not using a compression pumps, she is not elevating due to her lift chair as well as her  hospital bed being broken, and she really is not keeping her legs up as much as they should be and also has been taking off her wraps. All this combined I think has led to poor fluid control and to be honest she may be somewhat volume overloaded in general as well. I recommend that she may need to contact your physician to see if a  prescription for a diuretic would be beneficial in their opinion. As long as this is safe I think it would likely help her. 09/29/18 on evaluation today patient's left lower extremity actually appears to be doing quite a bit better. At least compared to last time that I saw her. She still has a large area where she is draining from but there's a lot of new skin speckled trout and in fact there's more new skin that there are open areas of weeping and drainage at this point. This is good news. With regard to the right lower extremity this is doing much better with the only open area that I really see being a dry spot on the right lateral ankle currently. Fortunately there's no signs of active infection at this time which is good news. No fevers, chills, nausea, or vomiting noted at this time. The patient seems somewhat stressed and overwhelmed during the visit today she was very lethargic as such. She does and she is not taking any pain medications at this point. Apparently according to her husband are also in the process of moving which is probably taking its toll on her as well. 10/06/18 on evaluation today patient appears to be doing rather well in regard to her lower extremities compared to last evaluation. Fortunately there's no signs of active infection. She tells me she did have an appointment with her primary. Nonetheless he was concerned that the wounds were somewhat deep based on pictures but we never actually saw her legs. She states that he had her somewhat worried due to the fact that she was fearing now that she was [place] have to have an amputation. With that being  said based on what I'm seeing check she looks better this week that she has the last two times I've seen her with much less drainage I'm actually pleased in this regard. That doesn't mean that she's out of the water but again I do not think what the point of talking about education at all in regard to her leg. She is very happy to hear this. She is also not having as much pain as she was having last week. 10/13/18 unfortunately on evaluation today patient still continues to have a significant amount of drainage she's not letting home health actually apply the compression dressings at this point. She's trying to use of Juxta-Lite of the top of Kerlex and the second layer of the three layer compression wrap. With that being said she just does not seem to be making as good a progress as I would expect if she was having the compression applied and in place on a regular basis. No fevers, chills, nausea, or vomiting noted at this time. 10/20/18 on evaluation today patient appears to be doing a little better in regard to her bilateral lower extremity ulcers. In fact the right lower extremity seems to be healed she doesn't even have any openings at this point left lower extremity though still somewhat macerated seems to be showing signs of new skin growth at multiple locations throughout. Fortunately there's no evidence of active infection at this time. No fevers, chills, nausea, or vomiting noted at this time. 10/27/18 on evaluation today patient appears to be doing much better in regard to her left lower Trinity ulcer. She's been tolerating the laptop complication and has minimal drainage noted at this point. Fortunately there's no signs of active infection at this time. No fevers, chills, nausea, or vomiting noted at this time. 11/03/18 on evaluation today  patient actually appears to be doing excellent in regard to her left lower extremity. She is having very little drainage at this point there does not appear  to be any significant signs of infection overall very pleased with how things have gone. She is likewise extremely pleased still and seems to be making wonderful progress week to week. I do believe antibiotics were helpful for her. Her primary care provider did place on amateur clean since I last saw her. 11/17/18 on evaluation today patient appears to be doing worse in regard to her bilateral lower extremities at this point. She is been tolerating the dressing changes without complication. With that being said she typically takes the Coban off fairly quickly upon arriving home even after being seen here in the clinic and does not allow home health reapply command as part of the dressing at home. Therefore she said no compression essentially since I last saw her as best I can tell. With that being said I think it shows and how much swelling she has in the open wounds that are noted at this point. Fortunately there's no signs of infection but unfortunately if she doesn't get this under control I think she will end up with infection and more significant issues. 11/24/18 on evaluation today patient actually appears to be doing somewhat better in regard to her bilateral lower extremities. 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 Nonetheless that is doing better currently so she should be able to attempt reinitiating the lymphedema pumps at this time. No fevers, chills, nausea, or vomiting noted at this time. 12/01/18 upon evaluation today patient's left lower extremity appears to be doing quite well unfortunately her right lower extremity is not doing nearly as well. She has been tolerating the dressing changes without complication unfortunately she did not keep a wrap on the right at this time. Nonetheless I believe this has led to increased swelling and weeping in  the world is actually much larger than during the last evaluation with her. 12/08/18 on evaluation today patient appears to be doing about the same at this point in regard to her right lower extremity. There is some more palatable to touch I'm concerned about the possibility of there being some infection although I think the main issue is she's not keeping her compression wrap on which in turn is not allowing this area to heal appropriately. 12/22/18 on evaluation today patient appears to be doing better in regard to left lower extremity unfortunately significantly worse in regard to the right lower extremity. The areas of blistering and necrotic superficial tissue have spread and again this does not really appear to be signs of infection and all she just doesn't seem to be doing nearly as well is what she has been in the past. Overall I feel like the Augmentin did absolutely nothing for her she doesn't seem to have any infection again I really didn't think so last time either is more of a potential preventative measure and hoping that this would make some difference but I think the main issue is she's not wearing her compression. She tells me she cannot wear the [place] been we put on she takes it off pretty much upon getting home. Subsequently she worshiped Juxta-Lite when I questioned her about how often she wears it this is no  more than three hours a day obviously that leaves 21 hours that she has no compression and this is obviously not doing well for her. Overall I'm concerned that if things continue to worsen she is at great risk of both infection as well as losing her leg. 01/05/19 on evaluation today patient appears to be doing well in regard to her left lower extremity which he is allowing [place] to wrap and not so well with regard to her right lower extremity which she is not allowing [place] to really wrap and keep the wrap on. She states that it hurts too badly whenever it's wrapped and she ends up  having to take it off. She's been using the Juxta-Lite she tells me up to six hours a day although I question whether or not that's really been the case to be honest. Previously she told me three hours today nonetheless obviously the legs as long as the wrap is doing great when she is not is doing much more poorly. 01/12/2019 on evaluation today patient actually appears to be doing a little better in my opinion with regard to her right lower extremity ulcer. She has a small open area on the left lower extremity unfortunately but again this I think is part of the normal fluctuation of what she is going to have to expect with regard to her legs especially when she is not using her lymphedema pumps on a regular basis. Subsequently based on what I am seeing today I think that she does seem to be doing slightly better with regard to her right lower extremity she did see her primary care provider on Monday they felt she had an infection and placed her on 2 antibiotics. Both Cipro and clindamycin. Subsequently again she seems possibly to be doing a little bit better in regards to the right lower extremity she also tells me however she has been wearing the compression wrap over the past week since I spoke with her as well that is a Kerlix and Coban wrap on the right. No fevers, chills, nausea, vomiting, or diarrhea. 01/19/2019 on evaluation today patient appears to be doing better with regard to her bilateral lower extremities especially the right. I feel like the compression has been beneficial for her which is great news. She did get a call from her primary care provider on her way here today telling her that she did have methicillin- resistant Staphylococcus aureus and he was calling in a couple new antibiotics for her including a ointment to be applied she tells me 3 times a day. With that being said this sounds like likely to be Bactroban which I think could be applied with each dressing/wrap change but I  would not be able to accommodate her applying this 3 times a day. She is in agreement with the least doing this we will add that to her orders today. 01/26/2019 on evaluation today patient actually appears to be doing much better with regard to her right lower extremity. Her left lower extremity is also doing quite well all things considering. Fortunately there is no evidence of active infection at this time. No fevers, chills, nausea, vomiting, or diarrhea. 02/02/2019 on evaluation today patient appears to be doing much better compared to her last evaluation. Little by little off like her right leg is returning more towards normal. There does not appear to be any signs of active infection and overall she seems to be doing quite well which is great news. I am very pleased  in this regard. No fevers, chills, nausea, vomiting, or diarrhea. 02/09/2019 upon evaluation today patient appears to be doing better with regard to her bilateral lower extremities. She has been tolerating the dressing changes without complication. Fortunately there is no signs of active infection at this time. No fevers, chills, nausea, vomiting, or diarrhea. 02/23/2019 on evaluation today patient actually appears to be doing quite well with regard to her bilateral lower extremities. She has been tolerating the dressing changes without complication. She is even used her pumps one time and states that she really felt like it felt good. With that being said she seems to be in good spirits and her legs appear to be doing excellent. 03/09/2019 on evaluation today patient appears to be doing well with regard to her right lower extremity there are no open wounds at this time she is having some discomfort but I feel like this is more neuropathy than anything. With regard to her left lower extremity she had several areas scattered around that she does have some weeping and drainage from but again overall she does not appear to be having any  significant issues and no evidence of infection at this time which is good news. 03/23/2019 on evaluation today patient appears to be doing well with regard to her right lower extremity which she tells me is still close she is using her juxta light here. Her left lower extremity she mainly just has an area on the foot which is still slightly draining although this also is doing great. Overall very pleased at this time. 04/06/2019 patient appears to be doing a little bit worse in regard to her left lower extremity upon evaluation today. She feels like this could be becoming infected again which she had issues with previous. Fortunately there is no signs of systemic infection but again this is always a struggle with her with her legs she will go from doing well to not so well in a very short amount of time. 04/20/2019 on evaluation today patient actually appears to be doing quite well with regard to her right lower extremity I do not see any signs of active infection at this time. Fortunately there is no fever chills noted. She is still taking the antibiotics which I prescribed for her at this point. In regard to the left lower extremity I do feel like some of these areas are better although again she still is having weeping from several locations at this time. 04/27/2019 on evaluation today patient appears to be doing about the same if not slightly worse in regard to her left lower extremity ulcers. She tells me when questioned that she has been sleeping in her Hoveround chair in fact she tells me she falls asleep without even knowing it. I think she is spending a whole lot of time in the chair and less time walking and moving around which is not good for her legs either. On top of that she is in a seated position which is also the worst position she is not really elevating her legs and she is also not using her lymphedema pumps. All this is good to contribute to worsening of her condition in  general. 05/18/2019 on evaluation today patient appears to be doing well with regard to her lower extremity on the right in fact this is showing no signs of any open wounds at this time. On the left she is continuing to have issues with areas that do drain. Some of the regions have healed and  there are couple areas that have reopened. She did go to the ER per the patient according to recommendations from the home health nurse due to what she was seen when she came out on 05/13/2019. Subsequently she felt like the patient needed to go to the hospital due to the fact that again she was having "milky white discharge" from her leg. Nonetheless she had and then was placed on doxycycline and subsequently seems to be doing better. 06/01/2019 upon evaluation today patient appears to be doing really in my opinion about the same. I do not see any signs of active infection which is good news. Overall she still has wounds over the bilateral lower extremities she has reopened on the right but this appears to be more of a crack where there is weeping/edema coming from the region. I do not see any evidence of infection at either site based on what I visualized today. 07/13/2019 upon evaluation today patient appears to be doing a little worse compared to last time I saw her. She since has been in the hospital from 06/21/2019 through 06/29/2019. This was secondary to having Covid. During that time they did apply lotion to her legs which unfortunately has caused her to develop a myriad of open wounds on her lower extremities. Her legs do appear to be doing better as far as the overall appearance is concerned but nonetheless she does have more open and weeping areas. 07/27/2019 upon evaluation today patient appears to be doing more poorly to be honest in regard to her left lower extremity in particular. There is no signs of systemic infection although I do believe she may have local infection. She notes she has been having  a lot of blue/green drainage which is consistent potentially with Pseudomonas. That may be something that we need to consider here as well. The doxycycline does not seem to have been helping. 08/03/2019 upon evaluation today patient appears to be doing a little better in my opinion compared to last week's evaluation. Her culture I did review today and she is on appropriate medications to help treat the Enterobacter that was noted. Overall I feel like that is good news. With that being said she is unfortunately continuing to have a lot of drainage and though it is doing better I still think she has a long ways to go to get things dried up in general. Fortunately there is no signs of systemic infection. 08/10/2019 upon evaluation today patient appears to be doing may be slightly better in regard to her left lower extremity the right lower extremity is doing much better. Fortunately there is no signs of infection right now which is good news. No fevers, chills, nausea, vomiting, or diarrhea. 08/24/2019 on evaluation today patient appears to be doing slightly better in regard to her lower extremities. The left lower extremity seems to be healed the right lower extremity is doing better though not completely healed as far as the openings are concerned. She has some generalized issues here with edema and weeping secondary to her lymphedema though again I do believe this is little bit drier compared to prior weeks evaluations. In general I am very pleased with how things seem to be progressing. No fevers, chills, nausea, vomiting, or diarrhea. 08/31/2019 upon evaluation today patient actually seems to making some progress here with regard to the left lower extremity in particular. She has been tolerating the dressing changes without complication. Fortunately there is no signs of active infection at this time. No fevers,  chills, nausea, vomiting, or diarrhea. She did see Dr. Doren Custard and he did note that she did have  a issue with the left great saphenous vein and the small saphenous vein in the leg. With that being said he was concerned about the possibility of laser ablation not being extremely successful. He also mentioned a small risk of DVT associated with the procedure. However if the wounds do not continue to improve he stated that that would probably be the way to go. Fortunately the patient's legs do seem to be doing much better. 09/07/2019 upon evaluation today patient appears to be doing better with regard to her lower extremities. She has been tolerating the dressing changes without complication. With that being said she is showing signs of improvement and overall very pleased. There are some areas on her leg that I think we do need to debride we discussed this last week the patient is in agreement with doing that as long as it does not hurt too badly. 09/14/2019 upon evaluation today patient appears to be doing decently well with regard to her left lower extremity. She is not having near as much weeping as she has had in the past things seem to be drying up which is good news. There is no signs of active infection at this time. 09/21/19 upon evaluation today patient appears to be doing better in regard overall to her bilateral lower extremities. She again has less open than she did previous and each week I feel like this is getting better. Fortunately there is no signs of active infection at this time. No fevers, chills, nausea, vomiting, or diarrhea. 09/28/2019 upon evaluation today patient actually appears to be showing signs of improvement with regard to her left lower extremity. Unfortunately the right medial lower extremity around the ankle region has reopened to some degree but this appears to be minimal still which is good news. There is no signs of active infection at this time which is also good news. 10/12/2019 upon evaluation today patient appears to be doing okay with regard to her bilateral  lower extremities today. The right is a little bit worse then last evaluation 2 weeks ago. The left is actually doing a little better in my opinion. Overall there is no signs of active infection at this time that I see. Obviously that something we have to keep a close eye on she is very prone to this with the significant and multiple openings that she has over the bilateral lower extremities. 10/19/2019 upon evaluation today patient appears to be doing about the best that I have seen her in quite some time. She has been tolerating the dressing changes without complication. There does not appear to be any signs of active infection and overall I am extremely happy with the way her legs appeared. She is drying up quite nicely and overall is having less pain. 11/09/2019 upon evaluation today patient appears to be doing better in regard to her wounds. She seems to be drying up more and more each time I see her this is just taking a very long time. Fortunately there is no signs of active infection at this time. 11/23/2019 upon evaluation today patient actually appears to be doing excellent in regard to her lower extremities at this point compared to where she has been. Fortunately there is no signs of active infection at this time. She did go to the hospital last week for nausea and vomiting completely unrelated to her wounds. Fortunately she is doing better  she was given some Reglan and got better. She had associated abdominal pain but they never found out what was going on. 12/07/2019 upon evaluation today patient appears to be doing well for the most part in regard to her legs. She unfortunately has not been keeping the [place] portion of her wraps on therefore the compression has not really been sufficient for what it is supposed to be. Nonetheless she tells me that it just hurt too bad therefore she removed it. 12/21/2019 upon evaluation today patient actually appears to be doing quite well with regard to her  legs. I do feel like she has been making progress which is great news and overall there is no signs of active infection at this time. No fevers, chills, nausea, vomiting, or diarrhea. 01/04/2020 upon evaluation today patient presents for follow-up concerning her lower extremity edema bilaterally. She still has open wounds she has not been using her lymphedema pumps. She is also not been utilizing her compression wraps appropriately she tends to unwrap them, take them off, or states that they hurt. Obviously the reason they hurt is because her legs start to swell but the issue is if she would use her compression/lymphedema pumps regularly she would not swell and she would have the pain. Nonetheless she has not even picked them up once honestly over the past several months and may be even as much as in the past year based on my opinion and what have seen. She tells me today that after last week when I talked about this with her specifically actually that was 2 weeks ago that she "forgot". 8//21 on evaluation today patient appears to be doing a little better in regard to her legs bilaterally. Fortunately there is no signs of active infection at this time. She tells me that she used her lymphedema pumps all of one time over the past 2 weeks since I last saw her. She tells me that she has been too busy in order to continue to use these. 02/01/2020 on evaluation today patient appears to be doing some better in regard to her wounds in general in her legs. We felt the right was healed although is not completely it does appear to be doing better she tells me she has been using her lymphedema pumps that she has had this six times since I last saw her. Obviously the more she does that the better she would do my opinion 02/15/2020 upon evaluation today patient appears to be doing about the same in regard to her legs. She tells me that she is pumping I'm still not sure how much she does to be perfectly honest. However  even if she does a little bit here and there I guess that is better than nothing. Fortunately there is no sign of active infection at this time which is great news. No fevers, chills, nausea, vomiting, or diarrhea. 02/29/2020 on evaluation today patient actually appears to be doing quite well all things considered this week. She has been tolerating the dressing changes without complication. Fortunately there is no signs of active infection at this time. No fevers, chills, nausea, vomiting, or diarrhea. 03/14/2020 upon evaluation today patient appears to be doing really about the same in regard to her legs. There is no signs of improvement overall and she as noted from home health does not appear to be elevating her legs he can get into her lift chair. There is too much stuff piled up on it the patient tells me. She also  tells me she cannot really use her pumps effectively due to the fact that she cannot have any space to get them on. Finally she is also not really elevating her legs because she is not sleeping in her bed she is sleeping in her chair currently and again overall I think everything that she is done in combination has been exactly the wrong thing for what she needs for her legs. 04/04/2020 upon evaluation today patient appears to be doing well at this time with regard to her legs. She is actually been pumping, keeping her wraps on, and to be honest she seems to be doing dramatically better the right leg is excellent the left leg is also excellent and measuring much smaller than previous. 04/18/2020 upon evaluation today patient actually is continue to make good progress in regard to her lower extremities bilaterally. Everything is improving and less wet that has been in the past overall I am extremely pleased with where things stand and I think that she is making great progress. The patient tells me she still continue to use her compression pumps 05/02/2020 on evaluation today patient appears  to be doing well at this time in regard to her left leg which is showing signs of drying up. With that being said she does have a lot of lymphedema type crusty skin around the toes of her left foot and the right medial ankle which has opened at this point. Fortunately there is no signs of active infection systemically at this point or even locally for that matter. 05/23/2020 on evaluation today patient appears to be doing well with regard to her lower extremities. Fortunately there is no signs of active infection at this time. No fever chills noted. She has been very depressed however she tells me. 06/06/2020 patient came in today for evaluation in regard to her bilateral lower extremity ulcerations. With that being said she came in feeling okay and actually laughing and joking around with the staff checking her in. Subsequently however she had a coughing spell and following the coughing spell it was a dramatic conversion from being jovial and joking around to being extremely short of breath her vital signs actually dropped in regard to her blood pressure from around 175 to down around 433 for systolic and from around 96 diastolic down to around 70. With that being said she also accompanied this with an increase in her respiratory rate which was also quite significant. Nonetheless I actually upon going into see her was extremely worried we called EMS to have her transported to the ER for further evaluation and treatment. She continued until EMS got here to be extremely short of breath even on 6 L of oxygen. Her oxygen saturation did come up to 99% but overall it was only 95 before which was not terrible she did feel like the oxygen helped her feel somewhat better however. She has a history of asthma but again even the coughing spell really should not have done this degree of alteration in her demeanor from where she was just before to after the coughing spell. She tells [place] however she has been feeling  somewhat abnormal since Friday although she could not really pinpoint exactly what was going on. 06/27/2020 plan evaluation today patient appears to be doing okay in regard to her leg ulcers. She is really not showing a lot of improvement to be honest and she still has a lot of weeping. With that being said after I last saw her we called  EMS to take her to the hospital she actually did not go she went to see her primary care provider who according to the patient have not identified and read through the note states that everything checked out okay. Nonetheless the patient has continued to have bouts where she gets very short of breath for seemingly no reason whatsoever. That happened even the same night that I saw her last time. This was after she refused transport to the hospital. With that being said she also tells me that just getting dressed to come here is quite a chore which is putting on her shirt causing her to have to stop to catch her breath. Obviously this is not normal and I feel like there is something going on. She may need a referral to be seen by cardiology ASAP. She does see her primary care provider early next week she tells me 07/11/2020 upon evaluation today patient's wounds again appear to be doing about the same she mainly has weeping of the bilateral lower extremities both are very swollen today she tells me she has been using her pumps but last time I saw her she told me she did not even have her pumps out that she had "packed them away because they had too much stuff in their house. Apparently her husband has been bringing stuff in from storage units. She then subsequently told me that she had so much stuff in her house that she has been staying up all night and did not go to bed till 7:00 this morning because she was cleaning up stuff. Nonetheless I am still unsure as to whether or not she is using the pumps based on what I am seeing I would think probably not. Nonetheless she  has been keeping the compression wraps in place that is at least something 07/25/2020 upon evaluation today patient appears to be doing well currently in regard to her legs all things considered. I do not think she is doing any worse significantly although honestly I do not think she is doing a lot better either. There does not appear to be any signs of infection which is good news although she does have a lot of weeping. She is using her pumps she tells me sporadically though she says that her husband is not a big fan of helping her to get the [place]. She also tells me is difficult because in their house. It would be easiest for her to use these as where her son is actually staying with her and living out at this point. Obviously I understand that she is having a lot of issues here and to be honest I do not really know what to say in that regard and the fact that I really cannot change the situations but I do believe she really needs to be using the lymphedema pumps to try to keep things under control here. Outside of that I think she is going to continue to have significant issues. I think the pumps coupled with good compression and elevation are the only things that are to help her at this point. 08/08/2020 upon evaluation today patient appears to be doing a little worse in regard to her legs in general. I think this may be due to some infection currently. With that being said I am going to go ahead and likely see about putting her on an antibiotic. Were also can obtain a culture today where she had some purulent drainage. Electronic Signature(s) Signed: 08/08/2020 12:46:41 PM By:  Melburn Hake, Valoria Tamburri PA-C Entered By: [name] on 08/08/2020 12:46:41 -------------------------------------------------------------------------------- Physical Exam Details Patient Name: Date of Service: [name], ATCHLEY 08/08/2020 10:30 A M Medical Record Number: [id number] Patient Account Number: [id number] Date of  Birth/Sex: Treating RN: Sep 04, 1950 (70 y.o. [name] Primary Care Provider: [name] Other Clinician: Referring Provider: Treating Provider/Extender: Darlen Round in Treatment: 66 Constitutional Well-nourished and well-hydrated in no acute distress. Respiratory normal breathing without difficulty. Psychiatric this patient is able to make decisions and demonstrates good insight into disease process. Alert and Oriented x 3. pleasant and cooperative. Notes Upon inspection patient's legs appear to show some signs of increased weeping in general. I am concerned about infection she has had this happen before in the past she has done well with antibiotics. We will get a go and see about getting that started for her currently. Electronic Signature(s) Signed: 08/08/2020 12:47:15 PM By: [name] PA-C Entered By: [name] on 08/08/2020 12:47:15 -------------------------------------------------------------------------------- Physician Orders Details Patient Name: Date of Service: [name]. 08/08/2020 10:30 A M Medical Record Number: [id number] Patient Account Number: [id number] Date of Birth/Sex: Treating RN: 03/16/1951 (70 y.o. [name] Primary Care Provider: [name] Other Clinician: Referring Provider: Treating Provider/Extender: Darlen Round in Treatment: [phone] Verbal / Phone Orders: No Diagnosis Coding ICD-10 Coding Code Description E11.622 Type 2 diabetes mellitus with other skin ulcer I89.0 Lymphedema, not elsewhere classified I87.331 Chronic venous hypertension (idiopathic) with ulcer and inflammation of right lower extremity I87.332 Chronic venous hypertension (idiopathic) with ulcer and inflammation of left lower extremity L97.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 leg with fat layer exposed L97.822 Non-pressure chronic ulcer of other part of left lower leg with fat layer  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 Bathing/ Shower/ Hygiene May shower and wash wound with soap and water. - with dressing changes, wash both legs with wash cloth and soap and water with dressing changes Edema Control - Lymphedema / SCD / Other Bilateral Lower Extremities Lymphedema Pumps. Use Lymphedema pumps on leg(s) 2-3 times a day for 45-60 minutes. If wearing any wraps or hose, do not remove them. Continue exercising as instructed. Elevate legs to the level of the heart or above for 30 minutes daily and/or when sitting, a frequency of: Avoid standing for long periods of time. Exercise regularly Home Health No change in wound care orders this week; continue Home Health for wound care. May utilize formulary equivalent dressing for wound treatment orders unless otherwise specified. Other Home Health Orders/Instructions: - [facility] Wound #61 - Lower Leg Wound Laterality: Left, Circumferential Peri-Wound Care: Zinc Oxide Ointment 30g tube 2 x Per Week/30 Days Discharge Instructions: Apply Zinc Oxiide to weeping areas with each dressing change Peri-Wound Care: Sween Lotion (Moisturizing lotion) (Home Health) 2 x Per Week/30 Days Discharge Instructions: Apply moisturizing lotion as directed Secondary Dressing: Zetuvit Plus 4x4 in 2 x Per Week/30 Days Discharge Instructions: or equivalent extra absorbent pad.Apply over primary dressing as directed. Secondary Dressing: Zetuvit Plus 4x8 in 2 x Per Week/30 Days Discharge Instructions: or equivalent extra absorbent pad. Apply over primary dressing as directed. Compression Wrap: ThreePress (3 layer compression wrap) (Home Health) 2 x Per Week/30 Days Discharge  Instructions: Apply  three layer compression as directed. Pad bend of ankle with foam or ABD pad. Wound #64 - Foot Wound Laterality: Dorsal, Left Peri-Wound Care: Zinc Oxide Ointment 30g tube 2 x Per Week/30 Days Discharge Instructions: Apply Zinc Oxiide to weeping areas with each dressing change Peri-Wound Care: Sween Lotion (Moisturizing lotion) (Home Health) 2 x Per Week/30 Days Discharge Instructions: Apply moisturizing lotion as directed Secondary Dressing: Zetuvit Plus 4x4 in 2 x Per Week/30 Days Discharge Instructions: or equivalent extra absorbent pad.Apply over primary dressing as directed. Secondary Dressing: Zetuvit Plus 4x8 in 2 x Per Week/30 Days Discharge Instructions: or equivalent extra absorbent pad. Apply over primary dressing as directed. Compression Wrap: ThreePress (3 layer compression wrap) (Home Health) 2 x Per Week/30 Days Discharge Instructions: Apply three layer compression as directed. Pad bend of ankle with foam or ABD pad. Wound #67 - Lower Leg Wound Laterality: Right, Medial Peri-Wound Care: Zinc Oxide Ointment 30g tube 2 x Per Week/30 Days Discharge Instructions: Apply Zinc Oxiide to weeping areas with each dressing change Peri-Wound Care: Sween Lotion (Moisturizing lotion) (Home Health) 2 x Per Week/30 Days Discharge Instructions: Apply moisturizing lotion as directed Secondary Dressing: Zetuvit Plus 4x4 in 2 x Per Week/30 Days Discharge Instructions: or equivalent extra absorbent pad.Apply over primary dressing as directed. Secondary Dressing: Zetuvit Plus 4x8 in 2 x Per Week/30 Days Discharge Instructions: or equivalent extra absorbent pad. Apply over primary dressing as directed. Compression Wrap: ThreePress (3 layer compression wrap) (Home Health) 2 x Per Week/30 Days Discharge Instructions: Apply three layer compression as directed. Pad bend of ankle with foam or ABD pad. Wound #69 - Foot Wound Laterality: Dorsal, Right Peri-Wound Care: Zinc Oxide Ointment  30g tube 2 x Per Week/30 Days Discharge Instructions: Apply Zinc Oxiide to weeping areas with each dressing change Peri-Wound Care: Sween Lotion (Moisturizing lotion) (Home Health) 2 x Per Week/30 Days Discharge Instructions: Apply moisturizing lotion as directed Secondary Dressing: Zetuvit Plus 4x4 in 2 x Per Week/30 Days Discharge Instructions: or equivalent extra absorbent pad.Apply over primary dressing as directed. Secondary Dressing: Zetuvit Plus 4x8 in 2 x Per Week/30 Days Discharge Instructions: or equivalent extra absorbent pad. Apply over primary dressing as directed. Compression Wrap: ThreePress (3 layer compression wrap) (Home Health) 2 x Per Week/30 Days Discharge Instructions: Apply three layer compression as directed. Pad bend of ankle with foam or ABD pad. Laboratory naerobe culture (MICRO) - right foot Bacteria identified in Unspecified specimen by A LOINC Code: 505-6 Convenience Name: Anerobic culture Patient Medications llergies: ACE Inhibitors, metformin, propofol A Notifications Medication Indication Start End 08/08/2020 Levaquin DOSE 1 - oral 500 mg tablet - 1 tablet oral taken 1 time per day for 14 days Electronic Signature(s) Signed: 08/08/2020 11:44:11 AM By: [name] PA-C Entered By: [name] on 08/08/2020 11:44:07 -------------------------------------------------------------------------------- Problem List Details Patient Name: Date of Service: [name]. 08/08/2020 10:30 A M Medical Record Number: [id number] Patient Account Number: [id number] Date of Birth/Sex: Treating RN: 02/13/1951 (70 y.o. [name] Primary Care Provider: [name] Other Clinician: Referring Provider: Treating Provider/Extender: Darlen Round in Treatment: [phone] Active Problems ICD-10 Encounter Code Description Active Date MDM Diagnosis E11.622 Type 2 diabetes mellitus with other skin ulcer 08/05/2017 No Yes I89.0 Lymphedema,  not elsewhere classified 08/05/2017 No Yes I87.331 Chronic venous hypertension (idiopathic) with ulcer and inflammation of right 08/05/2017 No Yes lower extremity I87.332 Chronic venous hypertension (idiopathic) with ulcer and inflammation of left 08/05/2017 No Yes lower extremity L97.812 Non-pressure  chronic ulcer of other part of right lower leg with fat layer 08/05/2017 No Yes exposed L97.822 Non-pressure chronic ulcer of other part of left lower leg with fat layer exposed2/20/2019 No Yes L97.522 Non-pressure chronic ulcer of other part of left foot with fat layer exposed 06/01/2019 No Yes I10 Essential (primary) hypertension 08/05/2017 No Yes E66.01 Morbid (severe) obesity due to excess calories 08/05/2017 No Yes F41.8 Other specified anxiety disorders 08/05/2017 No Yes R53.1 Weakness 08/05/2017 No Yes Inactive Problems Resolved Problems Electronic Signature(s) Signed: 08/08/2020 11:20:21 AM By: [name] PA-C Entered By: [name] on 08/08/2020 11:20:20 -------------------------------------------------------------------------------- Progress Note Details Patient Name: Date of Service: [name]. 08/08/2020 10:30 A M Medical Record Number: [id number] Patient Account Number: [id number] Date of Birth/Sex: Treating RN: 1951/01/05 (70 y.o. [name] Primary Care Provider: [name] Other Clinician: Referring Provider: Treating Provider/Extender: Darlen Round in Treatment: 157 Subjective Chief Complaint Information obtained from Patient Bilateral reoccurring LE ulcers History of Present Illness (HPI) this patient has been seen a couple of times before and returns with recurrent problems to her right and left lower extremity with swelling and weeping ulcerations due to not wearing her compression stockings which she had been advised to do during her last discharge, at the end of June 2018. During her last visit the patient had had normal  arterial blood flow and her venous reflux study did not necessitate any surgical intervention. She was recommended compression and elevation and wound care. After prolonged treatment the patient was completely healed but she has been noncompliant with wearing or compressions.. She was here last week with an outpatient return visit planned but the patient came in a very poor general condition with altered mental status and was rushed to the ER on my request. With a history of hypertension, diabetes, TIA and right-sided weakness she was set up for an MRI on her brain and cervical spine and was sent to [facility]. Getting an MRI done was very difficult but once the workup was done she was found not to have any spinal stenosis, epidural abscess or hematoma or discitis. This was radiculopathy to be treated as an outpatient and she was given a follow-up appointment. Today she is feeling much better alert and oriented and has come to reevaluate her bilateral lower extremity lymphedema and ulceration 03/25/2017 -- she was admitted to the hospital on 03/16/2017 and discharged on 03/18/2017 with left leg cellulitis and ulceration. She was started on vancomycin and Zosyn and x-ray showed no bony involvement. She was treated for a cellulitis with IV antibiotics changed to Rocephin and Flagyl and was discharged on oral Keflex and doxycycline to complete a 7 day course. Last hemoglobin A1c was 7.1 and her other ailments including hypertension got asthma were appropriately treated. 05/06/2017 -- she is awaiting the right size of compression stockings from [facility] but other than that has been doing well. ====== Old notes 70 year old patient was seen one time last October and was lost to follow-up. She has recurrent problems with weeping and ulceration of her left lower extremity and has swelling of this for several years. It has been worse for the last 2 months. Past medical history is significant for diabetes  mellitus type 2, hypertension, gout, morbid obesity, depressive disorders, hiatal hernia, migraines, status post knee surgery, risk of a cholecystectomy, vaginal hysterectomy and breast biopsy. She is not a smoker. As noted before she has never had a venous duplex study and an arterial  ABI study was attempted but the left lower extremity was noncompressible 10/01/2016 -- had a lower extremity venous duplex reflux evaluation which showed no evidence of deep vein reflux in the right or left lower extremity, and no evidence of great saphenous vein reflux more than 500 ms in the right or left lower extremity, and the left small saphenous vein is incompetent but no vascular consult was recommended. review of her electronic medical records noted that the ABI was checked in July 2017 where the right ABI was normal limits and the left ABI could not be ascertained due to pain with cuff pressure but the waveforms are within normal limits. her arterial duplex study scheduled for April 27. 10/08/2016 -- the patient has various reasons for not having a compression on and for the last 3 days she has had no compression on her left lower extremity either due to pain or the lack of nursing help. She does not use her juxta lites either. 10/15/2016 -- the patient did not keep her appointment for arterial duplex study on April 27 and I have asked her to reschedule this. Her pain is out of proportion with the physical findings and she continuously fails to wear a compression wraps and cuts them off because she says she cannot tolerate the pain. She does not use her juxta lites either. 10/22/2016 -- he has rescheduled her arterial duplex study to May 21 and her pain today is a bit better. She has not been wearing her juxta lites on her right lower extremity but now understands that she needs to do this. She did tolerate the to press compression wrap on her left lower extremity 10/29/2016 --arterial duplex study is  scheduled for next week and overall she has been tolerating her compression wraps and also using her juxta lites on her right lower extremity 11/05/2016 -- the right ABI was 0.95 the left was 1.03. The digit TBI is on the right was 0.83 on the left was 0.92 and she had biphasic flow through these vessels. The impression was that of normal lower extremity arterial study. 11/12/2016 -- her pain is minimal and she is doing very well overall. 11/26/2016 -- she has got juxta lites and her insurance will not pay for additional dual layer compression stockings. She is going to order some from [place]. 05/12/2017 -- her juxta lites are very old and too big for her and these have not been helping with compression. She did get 20-30 mm compression stockings from [facility] but she and her husband are unable to put these on. I believe she will benefit from bilateral Extremit-ease, compression stockings and we will measure her for these today. 05/20/2017 -- lymphedema on the left lower extremity has increased a lot and she has a open ulceration as a result of this. The right lower extremity is looking pretty good. She has decided to by the compression stockings herself and will get reimbursed by the home health, at a later date. 05/27/2017 -- her sciatica is bothering her a lot and she thought her left leg pain was caused due to the compression wrap and hence removed it and has significant lymphedema. There is no inflammation on this left lower extremity. 06/17/17 on evaluation today patient appears to be doing very well and in fact is completely healed in regard to her ulcerations. Unfortunately however she does have continued issues with lymphedema nonetheless. We did order compression garments for her unfortunately she states that the size that she received were large although  we ordered medium. Obviously this means she is not getting the optimal compression. She does not have those with her today and therefore  we could not confirm and contact the company on her behalf. Nonetheless she does state that she is going to have her husband bring them by tomorrow so that we can verify and then get in touch with the company. No fevers, chills, nausea, or vomiting noted at this time. Overall patient is doing better otherwise and I'm pleased with the progress she has made. 07/01/17 on evaluation today patient appears to be doing very well in regard to her bilateral lower extremity she does not have any openings at this point which is excellent news. Overall I'm pleased with how things have progressed up to this time. Since she is doing so well we did order her compression which we are seeing her today to ensure that it fits her properly and everything is doing well in that regard and then subsequently she will be discharged. ============ Old Notes: 03/31/16 patient presents today for evaluation concerning open wounds that she has over the left medial ankle region as well as the left dorsal foot. She has previously had this occur although it has been healed for a number of months after having this for about a year prior until her hospitalization on 01/05/16. At that point in time it appears that she was admitted to the hospital for left lower extremity cellulitis and was placed on vancomycin and Zosyn at that point. Eventually upon discharge on January 15, 2016 she was placed on doxycycline at that point in time. Later on 03/27/16 positive wound culture growing Escherichia coli this was switched to amoxicillin. Currently she tells me that she is having pain radiated to be a 7 out of 10 which can be as high as 10 out of 10 with palpation and manipulation of the wound. This wound appears to be mainly venous in nature due to the bilateral lower extremity venous stasis/lymphedema. This is definitely much worse on her left than the right side. She does have type 1 diabetes mellitus, hypertension, morbid obesity, and is  wheelchair dependent.during the course of the hospital stay a blood culture was also obtained and fortunately appeared negative. She also had an x-ray of the tibia/fibula on the left which showed no acute bone abnormality. Her white blood cell count which was performed last on 03/25/16 was 7.3, hemoglobin 12.8, protein 7.1, albumin 3.0. Her urine culture appeared to be negative for any specific organisms. Patient did have a left lower extremity venous duplex evaluation for DVT . This did not include venous reflux studies but fortunately was negative for DVT Patient also had arterial studies performed which revealed that she had a . normal ABI on the right though this was unable to be performed on the left secondary to pain that she was having around the ankle region due to the wound. However it was stated on report that she had biphasic pulses and apparently good blood flow. ========== 06/03/17 she is here in follow-up evaluation for right lower extremity ulcer. The right lower sure he has healed but she has reopened to the left medial malleolus and dorsal foot with weeping. She is waiting for new compression garments to arrive from home health, the previous compression garments were ill fitting. We will continue with compression bilaterally and follow-up in 2 weeks Readmission: 08/05/17 on evaluation today patient appears to be doing somewhat poorly in regard to her left lower extremity especially although the  right lower extremity has a small area which may no longer be open. She has been having a lot of drainage from the left lower extremity however he tells me that she has not been able to use the EXTREMIT-EASE Compression at this point. She states that she did better and was able to actually apply the Juxta-Lite compression although the wound that she has is too large and therefore really does not compress which is why she cannot wear it at this point. She has no one who can help her put it  on regular basis her son can sometimes but he's not able to do it most of the time. I do believe that's why she has begun to weave and have issues as she is currently yet again. No fevers, chills, nausea, or vomiting noted at this time. Patient is no evidence of dementia. 08/12/17 on evaluation today patient appears to still be doing fairly well in regard to the draining areas/weeping areas at this point. With that being said she unfortunately did go to the ER yesterday due to what was felt to be possibly a cellulitis. They place her on doxycycline by mouth and discharge her home. She definitely was not admitted. With that being said she states she has had more discomfort which has been unusual for her even compared to prior times and she's had infections.08/12/17 on evaluation today patient appears to still be doing fairly well in regard to the draining areas/weeping areas at this point. With that being said she unfortunately did go to the ER yesterday due to what was felt to be possibly a cellulitis. They place her on doxycycline by mouth and discharge her home. She definitely was not admitted. With that being said she states she has had more discomfort which has been unusual for her even compared to prior times and she's had infections. 08/19/17 put evaluation today patient tells me that she's been having a lot of what sounds to be neuropathic type pain in regard to her left lower extremity. She has been using over-the-counter topical bins again which some believe. That in order to apply the she actually remove the wrap we put on her last Wednesday on Thursday. Subsequently she has not had anything on compression wise since that time. The good news is a lot of the weeping areas appear to have closed at this point again I believe she would do better with compression but we are struggling to get her to actually use what she needs to at this point. No fevers, chills, nausea, or vomiting noted at this  time. 09/03/17 on evaluation today patient appears to be doing okay in regard to her lower extremities in regard to the lymphedema and weeping. Fortunately she does not seem to show any signs of infection at this point she does have a little bit of weeping occurring in the right medial malleolus area. With that being said this does not appear to be too significant which is good news. 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 She has severe skin damage secondary to both of these features involving the dorsal left foot and medial left ankle and lower leg. Still has open areas in the left anterior foot. The area on the right closed over. She uses her own juxta light stockings. She does not have an arterial issue 09/16/17 on evaluation today patient actually appears to be doing excellent in regard to her bilateral lower extremity swelling. The Juxta-Lite  compression wrap seem to be doing very well for her. She has not however been using the portion that goes over her foot. Her left foot still is draining a little bit not nearly as significant as it has been in the past but still I do believe that she likely needs to utilize the full wrap including the foot portion of this will improve as well. She also has been apparently putting on a significant amount of Vaseline which also think is not helpful for her. I recommended that if she feels she needs something for moisturizer Eucerin will probably be better. 09/30/17 on evaluation today patient presents with several new open areas in regard to her left lower extremity although these appear to be minimal and mainly seem to be more moisture breakdown than anything. Fortunately she does not seem to have any evidence of infection which is great news. She has been tolerating the dressing changes without complication we are using silver alginate on the foot she has been using AB pads to have the legs  and using her Juxta- Lite compression which seems to be controlling her swelling very well. Overall I'm pleased with the poor way she has progressed. 10/14/17 on evaluation today patient appears to be doing better in regard to her left lower extremity areas of weeping. She does still have some discomfort although in general this does not appear to be as macerated and I think it is progressing nicely. I do think she still needs to wear the foot portion of her Juxta- Lite in order to get the most benefit from the wrap obviously. She states she understands. Fortunately there does not appear to be evidence of infection at this time which is great news. 10/28/17 on evaluation today patient appears to be doing excellent in regard to her left lower extremity. She has just a couple areas that are still open and seem to be causing any trouble whatsoever. For that reason I think that she is definitely headed in the right direction the spots are very tiny compared to what we have been dealing with in the past. 11/11/17 on evaluation today patient appears to have a right lateral lower extremity ulcer that has opened since I last saw her. She states this is where the home health nurse that was coming out remove the dressing without wetting the alginate first. Nonetheless I do not know if this is indeed the case or not but more importantly we have not ordered home help to be coming out for her wounds at all. I'm unsure as to why they are coming out and we're gonna have to check on this and get things situated in that regard. With that being said we currently really do not need them to be coming out as the patient has been taking care of her leg herself without complication and no issues. In fact she was doing much better prior to nursing coming out. 11/25/17 on evaluation today patient actually appears to be doing fairly well in regard to her left lower extremity swelling. In fact she has very little area of weeping at  this point there's just a small spot on the lateral portion of her right leg that still has me just a little bit more concerned as far as wanting to see this clear up before I discharge her to caring for this at home. Nonetheless overall she has made excellent progress. 12/09/17 on evaluation today patient appears to be doing rather well in regard to her lower  extremity edema. She does have some weeping still in the left lower extremity although the big area we were taking care of two weeks ago actually has closed and she has another area of weeping on the left lower extremity immediately as well is the top of her foot. She does not currently have lymphedema pumps she has been wearing her compression daily on a regular basis as directed. With that being said I think she may benefit from lymphedema pumps. 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 We had a very difficult time getting and keeping this under control. 12/23/17 on evaluation today patient actually appears to be doing a little bit more poorly in regard to her bilateral lower extremities. She has been tolerating the Juxta-Lite compression wraps. Unfortunately she has two new ulcers on the right lower extremity and left lower Trinity ulceration seems to be larger. Obviously this is not good news. She has been tolerating the dressings without complication. 12/30/17 on evaluation today patient actually appears to be doing much better in regard to her bilateral lower extremity edema. She continues to have some issues with ulcerations and in fact there appears to be one spot on each leg where the wrap may have caused a little bit of a blister which is subsequently opened up at this point is given her pain. Fortunately it does not appear to be any evidence of infection which is good news. No fevers  chills noted. 01/13/18 on evaluation today patient appears to be doing rather well in regard to her bilateral lower extremities. The dressings did get kind of stuck as far as the wound beds are concerned but again I think this is mainly due to the fact that she actually seems to be showing signs of healing which is good news. She's not having as much drainage therefore she was having more of the dressing sticking. Nonetheless overall I feel like her swelling is dramatically down compared to previous. 01/20/18 on evaluation today patient unfortunately though she's doing better in most regards has a large blister on the left anterior lower extremity where she is draining quite significantly. Subsequently this is going to need debridement today in order to see what's underneath and ensure she does not continue to trapping fluid at this location. Nonetheless No fevers, chills, nausea, or vomiting noted at this time. 01/27/18 on evaluation today patient appears to be doing rather well at this point in regard to her right lower extremity there's just a very small area that she still has open at this point. With that being said I do believe that she is tolerating the compression wraps very well in making good progress. Home health is coming out at this point to see her. Her left lower extremity on the lateral portion is actually what still mainly open and causing her some discomfort for the most part 02/10/18 on evaluation today patient actually appears to be doing very well in regard to her right lower extremity were all the ulcers appear to be completely close. In regard to the left lower extremity she does have two areas still open and some leaking from the dorsal surface of her foot but this still seems to be doing much better to me in general. 02/24/18 on evaluation today patient actually appears to be doing much better in regard to her right lower extremity  this is still completely healed. Her left  lower extremity is also doing much better fortunately she has no evidence of infection. The one area that is gonna require some debridement is still on the left anterior shin. Fortunately this is not hurting her as badly today. 03/10/18 on evaluation today patient appears to be doing better in some regards although she has a little bit more open area on the dorsal foot and she also has some issues on the medial portion of the left lower extremity which is actually new and somewhat deep. With that being said there fortunately does not appear to be any significant signs of infection which is good news. No fevers, chills, nausea, or vomiting noted at this time. In general her swelling seems to be doing fairly well which is good news. 03/31/18 on evaluation today patient presents for follow-up concerning her left lower extremity lymphedema. Unfortunately she has been doing a little bit more poorly since I last saw her in regard to the amount of weeping that she is experiencing. She's also having some increased pain in the anterior shin location. Unfortunately I do not feel like the patient is making such good progress at this point a few weeks back she was definitely doing much better. 04/07/18 on evaluation today patient actually appears to be showing some signs of improvement as far as the left lower extremity is concerned. She has been tolerating the dressing changes and it does appear that the Drawtex did better for her. With that being said unfortunately home health is stating that they cannot obtain the Drawtex going forward. Nonetheless we're gonna have to check and see what they may be able to get the alginate they were using was getting stuck in causing new areas of skin being pulled all that with and subsequently weep and calls her to worsen overall this is the first time we've seen improvement at this time. 04/14/18 on evaluation today patient actually appears to be doing rather well at this  point there does not appear to be any evidence of infection at this time and she is actually doing excellent in regard to the weeping in fact she almost has no openings remaining even compared to just last week this is a dramatic improvement. No fevers chills noted 04/21/18 evaluation today patient actually appears to be doing very well. She in fact is has a small area on the posterior lower extremity location and she has a small area on the dorsal surface of her foot that are still open both of which are very close to closing. We're hoping this will be close shortly. She brought her Juxta-Lite wrap with her today hoping that would be able to put her in it unfortunately I don't think were quite at that point yet but we're getting closer. 04/28/18 upon evaluation today patient actually appears to be doing excellent in regard to her left lower extremity ulcer. In fact the region on the posterior lower extremity actually is much smaller than previously noted. Overall I'm very happy with the progress she has made. She again did bring her Juxta-Lite although we're not quite ready for that yet. 05/11/18 upon evaluation today patient actually appears to be doing in general fairly well in regard to her left lower Trinity. The swelling is very well controlled. With that being said she has a new area on the left anterior lower extremity as well as between the first and second toes of her left foot that was not present during the  last evaluation. The region of her posterior left lower extremity actually appears to be almost completely healed. T be honest I'm very pleased with o the way that stands. Nonetheless I do believe that the lotion may be keeping the area to moist as far as her legs are concerned subsequently I'm gonna consider discontinuing that today. 05/26/18 on evaluation today patient appears to be doing rather well in regard to her left lower should be ulcers. In fact everything appears to be close  except for a very small area on the left posterior lower extremity. Fortunately there does not appear to be any evidence of infection at this time. Overall very pleased with her progress. 06/02/18 and evaluation today patient actually appears to be doing very well in regard to her lower extremity ulcers. She has one small area that still continues to weep that I think may benefit her being able to justify lotion and user Juxta-Lite wraps versus continued to wrap her. Nonetheless I think this is something we can definitely look into at this point. 06/23/18 on evaluation today patient unfortunately has openings of her bilateral lower extremities. In general she seems to be doing much worse than when I last saw her just as far as her overall health standpoint is concerned. She states that her discomfort is mainly due to neuropathy she's not having any other issues otherwise. No fevers, chills, nausea, or vomiting noted at this time. 06/30/18 on evaluation today patient actually appears to be doing a little worse in regard to her right lower extremity her left lower extremity of doing fairly well. Fortunately there is no sign of infection at this time. She has been tolerating the dressing changes without complication. Home health did not come out like they were supposed to for the appropriate wrap changes. They stated that they never received the orders from [place] which were fax. Nonetheless we will send a copy of the orders with the patient today as well. 07/07/18 on evaluation today patient appears to be doing much better in regard to lower extremities. She still has several openings bilaterally although since I last saw her her legs did show obvious signs of infection when she later saw her nurse. Subsequently a culture was obtained and she is been placed on Bactrim and Keflex. Fortunately things seem to be looking much better it does appear she likely had an infection. Again last week we'd even discussed it  but again there really was not any obvious sign that she had infection therefore we held off on the antibiotics. Nonetheless I'm glad she's doing better today. 07/14/18 on evaluation today patient appears to be doing much better regarding her bilateral lower Trinity's. In fact on the right lower for me there's nothing open at this point there are some dry skin areas at the sites where she had infection. Fortunately there is no evidence of systemic infection which is excellent news. No fevers chills noted 07/21/18 on evaluation today patient actually appears to be doing much better in regard to her left lower extremity ulcers. She is making good progress and overall I feel like she's improving each time I see her. She's having no pain I do feel like the infection is completely resolved which is excellent news. No fevers, chills, nausea, or vomiting noted at this time. 07/28/18 on evaluation today patient appears to be doing very well in regard to her left lower [employer]. Everything seems to be showing signs of improvement which is excellent news. Overall very pleased  with the progress that has been made. Fortunately there's no evidence of active infection at this time also excellent news. 08/04/18 on evaluation today patient appears to be doing more poorly in regard to her bilateral lower extremities. She has two new areas open up on the right and these were completely closed as of last week. She still has the two spots on the left which in my pinion seem to be doing better. Fortunately there's no evidence of infection again at this point. 08/11/18 on evaluation today patient actually appears to be doing very well in regard to her bilateral lower Trinity wounds that all seem to be doing better and are measures smaller today. Fortunately there's no signs of infection. No fevers, chills, nausea, or vomiting noted at this time. 08/18/18 on evaluation today patient actually appears to be doing about the  same inverter bilateral lower extremities. She continues to have areas that blistering open as was drain that fortunately nothing too significant. Overall I feel like Drawtex may have done better for her however compared to the collagen. 08/25/18 on evaluation today patient appears to be doing a little bit more poorly today even compared to last time I saw her. Again I'm not exactly sure why she's making worse progress over the past several weeks. I'm beginning to wonder if there is some kind of underlying low level infection causing this issue. I did actually take a culture from the left anterior lower extremity but it was a new wound draining quite a bit at this point. Unfortunately she also seems to be having more pain which is what also makes me worried about the possibility of infection. This is despite never erythema noted at this point. 09/01/18 on evaluation today patient actually appears to be doing a little worse even compared to last week in regard to bilateral lower extremities. She did go to the hospital on the 16th was given a dose of IV Zosyn and then discharged with a recommendation to continue with the Bactrim that I previously prescribed for her. Nonetheless she is still having a lot of discomfort she tells me as well at this time. This is definitely unfortunate. No fevers, chills, nausea, or vomiting noted at this time. 09/08/18 on evaluation today patient's bilateral lower extremities actually appear to be shown signs of improvement which is good news. Fortunately there does not appear to be any signs of active infection I think the anabiotic is helping in this regard. Overall I'm very pleased with how she is progressing. 09/15/18 patient was actually seen in ER yesterday due to her legs as well unfortunately. She states she's been having a lot of pain and discomfort as well as a lot of drainage. Upon inspection today the patient does have a lot of swelling and drainage I feel like this  is more related to lymphedema and poor fluid control than it is to infection based on what I'm seeing. The physician in the emergency department also doubted that the patient was having a significant infection nonetheless I see no evidence of infection obvious at this point although I do see evidence of poor fluid control. She still not using a compression pumps, she is not elevating due to her lift chair as well as her hospital bed being broken, and she really is not keeping her legs up as much as they should be and also has been taking off her wraps. All this combined I think has led to poor fluid control and to be honest she  may be somewhat volume overloaded in general as well. I recommend that she may need to contact your physician to see if a prescription for a diuretic would be beneficial in their opinion. As long as this is safe I think it would likely help her. 09/29/18 on evaluation today patient's left lower extremity actually appears to be doing quite a bit better. At least compared to last time that I saw her. She still has a large area where she is draining from but there's a lot of new skin speckled trout and in fact there's more new skin that there are open areas of weeping and drainage at this point. This is good news. With regard to the right lower extremity this is doing much better with the only open area that I really see being a dry spot on the right lateral ankle currently. Fortunately there's no signs of active infection at this time which is good news. No fevers, chills, nausea, or vomiting noted at this time. The patient seems somewhat stressed and overwhelmed during the visit today she was very lethargic as such. She does and she is not taking any pain medications at this point. Apparently according to her husband are also in the process of moving which is probably taking its toll on her as well. 10/06/18 on evaluation today patient appears to be doing rather well in regard to  her lower extremities compared to last evaluation. Fortunately there's no signs of active infection. She tells me she did have an appointment with her primary. Nonetheless he was concerned that the wounds were somewhat deep based on pictures but we never actually saw her legs. She states that he had her somewhat worried due to the fact that she was fearing now that she was [place] have to have an amputation. With that being said based on what I'm seeing check she looks better this week that she has the last two times I've seen her with much less drainage I'm actually pleased in this regard. That doesn't mean that she's out of the water but again I do not think what the point of talking about education at all in regard to her leg. She is very happy to hear this. She is also not having as much pain as she was having last week. 10/13/18 unfortunately on evaluation today patient still continues to have a significant amount of drainage she's not letting home health actually apply the compression dressings at this point. She's trying to use of Juxta-Lite of the top of Kerlex and the second layer of the three layer compression wrap. With that being said she just does not seem to be making as good a progress as I would expect if she was having the compression applied and in place on a regular basis. No fevers, chills, nausea, or vomiting noted at this time. 10/20/18 on evaluation today patient appears to be doing a little better in regard to her bilateral lower extremity ulcers. In fact the right lower extremity seems to be healed she doesn't even have any openings at this point left lower extremity though still somewhat macerated seems to be showing signs of new skin growth at multiple locations throughout. Fortunately there's no evidence of active infection at this time. No fevers, chills, nausea, or vomiting noted at this time. 10/27/18 on evaluation today patient appears to be doing much better in regard to her  left lower Trinity ulcer. She's been tolerating the laptop complication and has minimal drainage noted at this point.  Fortunately there's no signs of active infection at this time. No fevers, chills, nausea, or vomiting noted at this time. 11/03/18 on evaluation today patient actually appears to be doing excellent in regard to her left lower extremity. She is having very little drainage at this point there does not appear to be any significant signs of infection overall very pleased with how things have gone. She is likewise extremely pleased still and seems to be making wonderful progress week to week. I do believe antibiotics were helpful for her. Her primary care provider did place on amateur clean since I last saw her. 11/17/18 on evaluation today patient appears to be doing worse in regard to her bilateral lower extremities at this point. She is been tolerating the dressing changes without complication. With that being said she typically takes the Coban off fairly quickly upon arriving home even after being seen here in the clinic and does not allow home health reapply command as part of the dressing at home. Therefore she said no compression essentially since I last saw her as best I can tell. With that being said I think it shows and how much swelling she has in the open wounds that are noted at this point. Fortunately there's no signs of infection but unfortunately if she doesn't get this under control I think she will end up with infection and more significant issues. 11/24/18 on evaluation today patient actually appears to be doing somewhat better in regard to her bilateral lower extremities. 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 Nonetheless that is doing better currently so she should be able to attempt reinitiating the lymphedema pumps at this time.  No fevers, chills, nausea, or vomiting noted at this time. 12/01/18 upon evaluation today patient's left lower extremity appears to be doing quite well unfortunately her right lower extremity is not doing nearly as well. She has been tolerating the dressing changes without complication unfortunately she did not keep a wrap on the right at this time. Nonetheless I believe this has led to increased swelling and weeping in the world is actually much larger than during the last evaluation with her. 12/08/18 on evaluation today patient appears to be doing about the same at this point in regard to her right lower extremity. There is some more palatable to touch I'm concerned about the possibility of there being some infection although I think the main issue is she's not keeping her compression wrap on which in turn is not allowing this area to heal appropriately. 12/22/18 on evaluation today patient appears to be doing better in regard to left lower extremity unfortunately significantly worse in regard to the right lower extremity. The areas of blistering and necrotic superficial tissue have spread and again this does not really appear to be signs of infection and all she just doesn't seem to be doing nearly as well is what she has been in the past. Overall I feel like the Augmentin did absolutely nothing for her she doesn't seem to have any infection again I really didn't think so last time either is more of a potential preventative measure and hoping that this would make some difference but I think the main issue is she's not wearing her compression. She tells me she cannot wear the [place] been we put on she takes it  off pretty much upon getting home. Subsequently she worshiped Juxta-Lite when I questioned her about how often she wears it this is no more than three hours a day obviously that leaves 21 hours that she has no compression and this is obviously not doing well for her. Overall I'm concerned that  if things continue to worsen she is at great risk of both infection as well as losing her leg. 01/05/19 on evaluation today patient appears to be doing well in regard to her left lower extremity which he is allowing [place] to wrap and not so well with regard to her right lower extremity which she is not allowing [place] to really wrap and keep the wrap on. She states that it hurts too badly whenever it's wrapped and she ends up having to take it off. She's been using the Juxta-Lite she tells me up to six hours a day although I question whether or not that's really been the case to be honest. Previously she told me three hours today nonetheless obviously the legs as long as the wrap is doing great when she is not is doing much more poorly. 01/12/2019 on evaluation today patient actually appears to be doing a little better in my opinion with regard to her right lower extremity ulcer. She has a small open area on the left lower extremity unfortunately but again this I think is part of the normal fluctuation of what she is going to have to expect with regard to her legs especially when she is not using her lymphedema pumps on a regular basis. Subsequently based on what I am seeing today I think that she does seem to be doing slightly better with regard to her right lower extremity she did see her primary care provider on Monday they felt she had an infection and placed her on 2 antibiotics. Both Cipro and clindamycin. Subsequently again she seems possibly to be doing a little bit better in regards to the right lower extremity she also tells me however she has been wearing the compression wrap over the past week since I spoke with her as well that is a Kerlix and Coban wrap on the right. No fevers, chills, nausea, vomiting, or diarrhea. 01/19/2019 on evaluation today patient appears to be doing better with regard to her bilateral lower extremities especially the right. I feel like the compression has been beneficial for  her which is great news. She did get a call from her primary care provider on her way here today telling her that she did have methicillin- resistant Staphylococcus aureus and he was calling in a couple new antibiotics for her including a ointment to be applied she tells me 3 times a day. With that being said this sounds like likely to be Bactroban which I think could be applied with each dressing/wrap change but I would not be able to accommodate her applying this 3 times a day. She is in agreement with the least doing this we will add that to her orders today. 01/26/2019 on evaluation today patient actually appears to be doing much better with regard to her right lower extremity. Her left lower extremity is also doing quite well all things considering. Fortunately there is no evidence of active infection at this time. No fevers, chills, nausea, vomiting, or diarrhea. 02/02/2019 on evaluation today patient appears to be doing much better compared to her last evaluation. Little by little off like her right leg is returning more towards normal. There does not appear to  be any signs of active infection and overall she seems to be doing quite well which is great news. I am very pleased in this regard. No fevers, chills, nausea, vomiting, or diarrhea. 02/09/2019 upon evaluation today patient appears to be doing better with regard to her bilateral lower extremities. She has been tolerating the dressing changes without complication. Fortunately there is no signs of active infection at this time. No fevers, chills, nausea, vomiting, or diarrhea. 02/23/2019 on evaluation today patient actually appears to be doing quite well with regard to her bilateral lower extremities. She has been tolerating the dressing changes without complication. She is even used her pumps one time and states that she really felt like it felt good. With that being said she seems to be in good spirits and her legs appear to be doing  excellent. 03/09/2019 on evaluation today patient appears to be doing well with regard to her right lower extremity there are no open wounds at this time she is having some discomfort but I feel like this is more neuropathy than anything. With regard to her left lower extremity she had several areas scattered around that she does have some weeping and drainage from but again overall she does not appear to be having any significant issues and no evidence of infection at this time which is good news. 03/23/2019 on evaluation today patient appears to be doing well with regard to her right lower extremity which she tells me is still close she is using her juxta light here. Her left lower extremity she mainly just has an area on the foot which is still slightly draining although this also is doing great. Overall very pleased at this time. 04/06/2019 patient appears to be doing a little bit worse in regard to her left lower extremity upon evaluation today. She feels like this could be becoming infected again which she had issues with previous. Fortunately there is no signs of systemic infection but again this is always a struggle with her with her legs she will go from doing well to not so well in a very short amount of time. 04/20/2019 on evaluation today patient actually appears to be doing quite well with regard to her right lower extremity I do not see any signs of active infection at this time. Fortunately there is no fever chills noted. She is still taking the antibiotics which I prescribed for her at this point. In regard to the left lower extremity I do feel like some of these areas are better although again she still is having weeping from several locations at this time. 04/27/2019 on evaluation today patient appears to be doing about the same if not slightly worse in regard to her left lower extremity ulcers. She tells me when questioned that she has been sleeping in her Hoveround chair in fact she  tells me she falls asleep without even knowing it. I think she is spending a whole lot of time in the chair and less time walking and moving around which is not good for her legs either. On top of that she is in a seated position which is also the worst position she is not really elevating her legs and she is also not using her lymphedema pumps. All this is good to contribute to worsening of her condition in general. 05/18/2019 on evaluation today patient appears to be doing well with regard to her lower extremity on the right in fact this is showing no signs of any open wounds at  this time. On the left she is continuing to have issues with areas that do drain. Some of the regions have healed and there are couple areas that have reopened. She did go to the ER per the patient according to recommendations from the home health nurse due to what she was seen when she came out on 05/13/2019. Subsequently she felt like the patient needed to go to the hospital due to the fact that again she was having "milky white discharge" from her leg. Nonetheless she had and then was placed on doxycycline and subsequently seems to be doing better. 06/01/2019 upon evaluation today patient appears to be doing really in my opinion about the same. I do not see any signs of active infection which is good news. Overall she still has wounds over the bilateral lower extremities she has reopened on the right but this appears to be more of a crack where there is weeping/edema coming from the region. I do not see any evidence of infection at either site based on what I visualized today. 07/13/2019 upon evaluation today patient appears to be doing a little worse compared to last time I saw her. She since has been in the hospital from 06/21/2019 through 06/29/2019. This was secondary to having Covid. During that time they did apply lotion to her legs which unfortunately has caused her to develop a myriad of open wounds on her lower  extremities. Her legs do appear to be doing better as far as the overall appearance is concerned but nonetheless she does have more open and weeping areas. 07/27/2019 upon evaluation today patient appears to be doing more poorly to be honest in regard to her left lower extremity in particular. There is no signs of systemic infection although I do believe she may have local infection. She notes she has been having a lot of blue/green drainage which is consistent potentially with Pseudomonas. That may be something that we need to consider here as well. The doxycycline does not seem to have been helping. 08/03/2019 upon evaluation today patient appears to be doing a little better in my opinion compared to last week's evaluation. Her culture I did review today and she is on appropriate medications to help treat the Enterobacter that was noted. Overall I feel like that is good news. With that being said she is unfortunately continuing to have a lot of drainage and though it is doing better I still think she has a long ways to go to get things dried up in general. Fortunately there is no signs of systemic infection. 08/10/2019 upon evaluation today patient appears to be doing may be slightly better in regard to her left lower extremity the right lower extremity is doing much better. Fortunately there is no signs of infection right now which is good news. No fevers, chills, nausea, vomiting, or diarrhea. 08/24/2019 on evaluation today patient appears to be doing slightly better in regard to her lower extremities. The left lower extremity seems to be healed the right lower extremity is doing better though not completely healed as far as the openings are concerned. She has some generalized issues here with edema and weeping secondary to her lymphedema though again I do believe this is little bit drier compared to prior weeks evaluations. In general I am very pleased with how things seem to be progressing. No  fevers, chills, nausea, vomiting, or diarrhea. 08/31/2019 upon evaluation today patient actually seems to making some progress here with regard to the left lower extremity  in particular. She has been tolerating the dressing changes without complication. Fortunately there is no signs of active infection at this time. No fevers, chills, nausea, vomiting, or diarrhea. She did see Dr. Doren Custard and he did note that she did have a issue with the left great saphenous vein and the small saphenous vein in the leg. With that being said he was concerned about the possibility of laser ablation not being extremely successful. He also mentioned a small risk of DVT associated with the procedure. However if the wounds do not continue to improve he stated that that would probably be the way to go. Fortunately the patient's legs do seem to be doing much better. 09/07/2019 upon evaluation today patient appears to be doing better with regard to her lower extremities. She has been tolerating the dressing changes without complication. With that being said she is showing signs of improvement and overall very pleased. There are some areas on her leg that I think we do need to debride we discussed this last week the patient is in agreement with doing that as long as it does not hurt too badly. 09/14/2019 upon evaluation today patient appears to be doing decently well with regard to her left lower extremity. She is not having near as much weeping as she has had in the past things seem to be drying up which is good news. There is no signs of active infection at this time. 09/21/19 upon evaluation today patient appears to be doing better in regard overall to her bilateral lower extremities. She again has less open than she did previous and each week I feel like this is getting better. Fortunately there is no signs of active infection at this time. No fevers, chills, nausea, vomiting, or diarrhea. 09/28/2019 upon evaluation today patient  actually appears to be showing signs of improvement with regard to her left lower extremity. Unfortunately the right medial lower extremity around the ankle region has reopened to some degree but this appears to be minimal still which is good news. There is no signs of active infection at this time which is also good news. 10/12/2019 upon evaluation today patient appears to be doing okay with regard to her bilateral lower extremities today. The right is a little bit worse then last evaluation 2 weeks ago. The left is actually doing a little better in my opinion. Overall there is no signs of active infection at this time that I see. Obviously that something we have to keep a close eye on she is very prone to this with the significant and multiple openings that she has over the bilateral lower extremities. 10/19/2019 upon evaluation today patient appears to be doing about the best that I have seen her in quite some time. She has been tolerating the dressing changes without complication. There does not appear to be any signs of active infection and overall I am extremely happy with the way her legs appeared. She is drying up quite nicely and overall is having less pain. 11/09/2019 upon evaluation today patient appears to be doing better in regard to her wounds. She seems to be drying up more and more each time I see her this is just taking a very long time. Fortunately there is no signs of active infection at this time. 11/23/2019 upon evaluation today patient actually appears to be doing excellent in regard to her lower extremities at this point compared to where she has been. Fortunately there is no signs of active infection at this  time. She did go to the hospital last week for nausea and vomiting completely unrelated to her wounds. Fortunately she is doing better she was given some Reglan and got better. She had associated abdominal pain but they never found out what was going on. 12/07/2019 upon evaluation  today patient appears to be doing well for the most part in regard to her legs. She unfortunately has not been keeping the [place] portion of her wraps on therefore the compression has not really been sufficient for what it is supposed to be. Nonetheless she tells me that it just hurt too bad therefore she removed it. 12/21/2019 upon evaluation today patient actually appears to be doing quite well with regard to her legs. I do feel like she has been making progress which is great news and overall there is no signs of active infection at this time. No fevers, chills, nausea, vomiting, or diarrhea. 01/04/2020 upon evaluation today patient presents for follow-up concerning her lower extremity edema bilaterally. She still has open wounds she has not been using her lymphedema pumps. She is also not been utilizing her compression wraps appropriately she tends to unwrap them, take them off, or states that they hurt. Obviously the reason they hurt is because her legs start to swell but the issue is if she would use her compression/lymphedema pumps regularly she would not swell and she would have the pain. Nonetheless she has not even picked them up once honestly over the past several months and may be even as much as in the past year based on my opinion and what have seen. She tells me today that after last week when I talked about this with her specifically actually that was 2 weeks ago that she "forgot". 8//21 on evaluation today patient appears to be doing a little better in regard to her legs bilaterally. Fortunately there is no signs of active infection at this time. She tells me that she used her lymphedema pumps all of one time over the past 2 weeks since I last saw her. She tells me that she has been too busy in order to continue to use these. 02/01/2020 on evaluation today patient appears to be doing some better in regard to her wounds in general in her legs. We felt the right was healed although is not  completely it does appear to be doing better she tells me she has been using her lymphedema pumps that she has had this six times since I last saw her. Obviously the more she does that the better she would do my opinion 02/15/2020 upon evaluation today patient appears to be doing about the same in regard to her legs. She tells me that she is pumping I'm still not sure how much she does to be perfectly honest. However even if she does a little bit here and there I guess that is better than nothing. Fortunately there is no sign of active infection at this time which is great news. No fevers, chills, nausea, vomiting, or diarrhea. 02/29/2020 on evaluation today patient actually appears to be doing quite well all things considered this week. She has been tolerating the dressing changes without complication. Fortunately there is no signs of active infection at this time. No fevers, chills, nausea, vomiting, or diarrhea. 03/14/2020 upon evaluation today patient appears to be doing really about the same in regard to her legs. There is no signs of improvement overall and she as noted from home health does not appear to be elevating her  legs he can get into her lift chair. There is too much stuff piled up on it the patient tells me. She also tells me she cannot really use her pumps effectively due to the fact that she cannot have any space to get them on. Finally she is also not really elevating her legs because she is not sleeping in her bed she is sleeping in her chair currently and again overall I think everything that she is done in combination has been exactly the wrong thing for what she needs for her legs. 04/04/2020 upon evaluation today patient appears to be doing well at this time with regard to her legs. She is actually been pumping, keeping her wraps on, and to be honest she seems to be doing dramatically better the right leg is excellent the left leg is also excellent and measuring much smaller than  previous. 04/18/2020 upon evaluation today patient actually is continue to make good progress in regard to her lower extremities bilaterally. Everything is improving and less wet that has been in the past overall I am extremely pleased with where things stand and I think that she is making great progress. The patient tells me she still continue to use her compression pumps 05/02/2020 on evaluation today patient appears to be doing well at this time in regard to her left leg which is showing signs of drying up. With that being said she does have a lot of lymphedema type crusty skin around the toes of her left foot and the right medial ankle which has opened at this point. Fortunately there is no signs of active infection systemically at this point or even locally for that matter. 05/23/2020 on evaluation today patient appears to be doing well with regard to her lower extremities. Fortunately there is no signs of active infection at this time. No fever chills noted. She has been very depressed however she tells me. 06/06/2020 patient came in today for evaluation in regard to her bilateral lower extremity ulcerations. With that being said she came in feeling okay and actually laughing and joking around with the staff checking her in. Subsequently however she had a coughing spell and following the coughing spell it was a dramatic conversion from being jovial and joking around to being extremely short of breath her vital signs actually dropped in regard to her blood pressure from around 175 to down around 578 for systolic and from around 96 diastolic down to around 70. With that being said she also accompanied this with an increase in her respiratory rate which was also quite significant. Nonetheless I actually upon going into see her was extremely worried we called EMS to have her transported to the ER for further evaluation and treatment. She continued until EMS got here to be extremely short of breath even  on 6 L of oxygen. Her oxygen saturation did come up to 99% but overall it was only 95 before which was not terrible she did feel like the oxygen helped her feel somewhat better however. She has a history of asthma but again even the coughing spell really should not have done this degree of alteration in her demeanor from where she was just before to after the coughing spell. She tells [place] however she has been feeling somewhat abnormal since Friday although she could not really pinpoint exactly what was going on. 06/27/2020 plan evaluation today patient appears to be doing okay in regard to her leg ulcers. She is really not showing a lot of  improvement to be honest and she still has a lot of weeping. With that being said after I last saw her we called EMS to take her to the hospital she actually did not go she went to see her primary care provider who according to the patient have not identified and read through the note states that everything checked out okay. Nonetheless the patient has continued to have bouts where she gets very short of breath for seemingly no reason whatsoever. That happened even the same night that I saw her last time. This was after she refused transport to the hospital. With that being said she also tells me that just getting dressed to come here is quite a chore which is putting on her shirt causing her to have to stop to catch her breath. Obviously this is not normal and I feel like there is something going on. She may need a referral to be seen by cardiology ASAP. She does see her primary care provider early next week she tells me 07/11/2020 upon evaluation today patient's wounds again appear to be doing about the same she mainly has weeping of the bilateral lower extremities both are very swollen today she tells me she has been using her pumps but last time I saw her she told me she did not even have her pumps out that she had "packed them away because they had too much stuff  in their house. Apparently her husband has been bringing stuff in from storage units. She then subsequently told me that she had so much stuff in her house that she has been staying up all night and did not go to bed till 7:00 this morning because she was cleaning up stuff. Nonetheless I am still unsure as to whether or not she is using the pumps based on what I am seeing I would think probably not. Nonetheless she has been keeping the compression wraps in place that is at least something 07/25/2020 upon evaluation today patient appears to be doing well currently in regard to her legs all things considered. I do not think she is doing any worse significantly although honestly I do not think she is doing a lot better either. There does not appear to be any signs of infection which is good news although she does have a lot of weeping. She is using her pumps she tells me sporadically though she says that her husband is not a big fan of helping her to get the [place]. She also tells me is difficult because in their house. It would be easiest for her to use these as where her son is actually staying with her and living out at this point. Obviously I understand that she is having a lot of issues here and to be honest I do not really know what to say in that regard and the fact that I really cannot change the situations but I do believe she really needs to be using the lymphedema pumps to try to keep things under control here. Outside of that I think she is going to continue to have significant issues. I think the pumps coupled with good compression and elevation are the only things that are to help her at this point. 08/08/2020 upon evaluation today patient appears to be doing a little worse in regard to her legs in general. I think this may be due to some infection currently. With that being said I am going to go ahead and likely see about putting her  on an antibiotic. Were also can obtain a culture today where  she had some purulent drainage. Objective Constitutional Well-nourished and well-hydrated in no acute distress. Vitals Time Taken: 10:49 AM, Height: 62 in, Weight: 335 lbs, BMI: 61.3, Temperature: 99.1 F, Pulse: 103 bpm, Respiratory Rate: 20 breaths/min, Blood Pressure: 125/75 mmHg, Capillary Blood Glucose: 128 mg/dl. General Notes: glucose per pt report Respiratory normal breathing without difficulty. Psychiatric this patient is able to make decisions and demonstrates good insight into disease process. Alert and Oriented x 3. pleasant and cooperative. General Notes: Upon inspection patient's legs appear to show some signs of increased weeping in general. I am concerned about infection she has had this happen before in the past she has done well with antibiotics. We will get a go and see about getting that started for her currently. Integumentary (Hair, Skin) Wound #61 status is Open. Original cause of wound was Gradually Appeared. The date acquired was: 04/20/2019. The wound has been in treatment 68 weeks. The wound is located on the Left,Circumferential Lower Leg. The wound measures 12cm length x 27cm width x 0.1cm depth; 254.469cm^2 area and 25.447cm^3 volume. There is Fat Layer (Subcutaneous Tissue) exposed. There is no tunneling or undermining noted. There is a medium amount of serous drainage noted. The wound margin is flat and intact. There is large (67-100%) pink, pale granulation within the wound bed. There is no necrotic tissue within the wound bed. Wound #64 status is Open. Original cause of wound was Gradually Appeared. The date acquired was: 06/01/2019. The wound has been in treatment 62 weeks. The wound is located on the Left,Dorsal Foot. The wound measures 5.8cm length x 6cm width x 0.1cm depth; 27.332cm^2 area and 2.733cm^3 volume. There is Fat Layer (Subcutaneous Tissue) exposed. There is no tunneling or undermining noted. There is a medium amount of serous drainage noted. The  wound margin is flat and intact. There is large (67-100%) pink, pale granulation within the wound bed. There is no necrotic tissue within the wound bed. Wound #67 status is Open. Original cause of wound was Gradually Appeared. The date acquired was: 05/02/2020. The wound has been in treatment 14 weeks. The wound is located on the Right,Medial Lower Leg. The wound measures 4cm length x 5.2cm width x 0.1cm depth; 16.336cm^2 area and 1.634cm^3 volume. There is Fat Layer (Subcutaneous Tissue) exposed. There is no tunneling or undermining noted. There is a medium amount of serous drainage noted. The wound margin is thickened. There is large (67-100%) pink, pale granulation within the wound bed. There is no necrotic tissue within the wound bed. Wound #69 status is Open. Original cause of wound was Gradually Appeared. The date acquired was: 07/25/2020. The wound has been in treatment 2 weeks. The wound is located on the Right,Dorsal Foot. The wound measures 6.5cm length x 4cm width x 0.1cm depth; 20.42cm^2 area and 2.042cm^3 volume. There is Fat Layer (Subcutaneous Tissue) exposed. There is no tunneling or undermining noted. There is a medium amount of purulent drainage noted. The wound margin is distinct with the outline attached to the wound base. There is large (67-100%) pink, pale granulation within the wound bed. There is no necrotic tissue within the wound bed. 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 There was a Three Layer Compression Therapy Procedure by Rhae Hammock, RN. Post procedure Diagnosis Wound #61: Same as Pre-Procedure Wound #67 Pre-procedure diagnosis of Wound #67 is a Diabetic Wound/Ulcer of the Lower Extremity located on the Right,Medial Lower Leg . There was a Three Layer Compression Therapy Procedure by Rhae Hammock, RN. Post procedure Diagnosis Wound #67: Same as Pre-Procedure Plan Follow-up Appointments: Return Appointment in 2 weeks. Bathing/ Shower/ Hygiene: May shower and wash wound with soap and water. - with dressing changes, wash both legs with wash cloth and soap and water with dressing changes Edema Control - Lymphedema / SCD / Other: Lymphedema Pumps. Use Lymphedema pumps on leg(s) 2-3 times a day for 45-60 minutes. If wearing any wraps or hose, do not remove them. Continue exercising as instructed. Elevate legs to the level of the heart or above for 30 minutes daily and/or when sitting, a frequency of: Avoid standing for long periods of time. Exercise regularly Home Health: No change in wound care orders this week; continue Home Health for wound care. May utilize formulary equivalent dressing for wound treatment orders unless otherwise specified. Other Home Health Orders/Instructions: - [facility] ordered were: Anerobic culture - right foot The following medication(s) was prescribed: Levaquin oral 500 mg tablet 1 1 tablet oral taken 1 time per day for 14 days starting 08/08/2020 WOUND #61: - Lower Leg Wound Laterality: Left, Circumferential Peri-Wound Care: Zinc Oxide Ointment 30g tube 2 x Per Week/30 Days Discharge Instructions: Apply Zinc Oxiide to weeping areas with each  dressing change Peri-Wound Care: Sween Lotion (Moisturizing lotion) (Home Health) 2 x Per Week/30 Days Discharge Instructions: Apply moisturizing lotion as directed Secondary Dressing: Zetuvit Plus 4x4 in 2 x Per Week/30 Days Discharge Instructions: or equivalent extra absorbent pad.Apply over primary dressing as directed. Secondary Dressing: Zetuvit Plus 4x8 in 2 x Per Week/30 Days Discharge Instructions: or equivalent extra absorbent pad. Apply over primary dressing as directed. Com pression Wrap: ThreePress (3 layer compression wrap) (Home Health) 2 x Per Week/30 Days Discharge Instructions: Apply three layer compression as directed. Pad bend of ankle with foam or ABD pad. WOUND #64: - Foot Wound Laterality: Dorsal, Left Peri-Wound Care: Zinc Oxide Ointment 30g tube 2 x Per Week/30 Days Discharge Instructions: Apply Zinc Oxiide to weeping areas with each dressing change Peri-Wound Care: Sween Lotion (Moisturizing lotion) (Home Health) 2 x Per Week/30 Days Discharge Instructions: Apply moisturizing lotion as directed Secondary Dressing: Zetuvit Plus 4x4 in 2 x Per Week/30 Days Discharge Instructions: or equivalent extra absorbent pad.Apply over primary dressing as directed. Secondary Dressing: Zetuvit Plus 4x8 in 2 x Per Week/30 Days Discharge Instructions: or equivalent extra absorbent pad. Apply over primary dressing as directed. Com pression Wrap: ThreePress (3 layer compression wrap) (Home Health) 2 x Per Week/30 Days Discharge Instructions: Apply three layer compression as directed. Pad bend of ankle with foam or ABD pad. WOUND #67: - Lower Leg Wound Laterality: Right, Medial Peri-Wound Care: Zinc Oxide Ointment 30g tube 2 x Per Week/30 Days Discharge Instructions: Apply Zinc Oxiide to weeping areas with each dressing change Peri-Wound Care: Sween Lotion (Moisturizing lotion) (Home Health)  2 x Per Week/30 Days Discharge Instructions: Apply moisturizing lotion as directed Secondary  Dressing: Zetuvit Plus 4x4 in 2 x Per Week/30 Days Discharge Instructions: or equivalent extra absorbent pad.Apply over primary dressing as directed. Secondary Dressing: Zetuvit Plus 4x8 in 2 x Per Week/30 Days Discharge Instructions: or equivalent extra absorbent pad. Apply over primary dressing as directed. Com pression Wrap: ThreePress (3 layer compression wrap) (Home Health) 2 x Per Week/30 Days Discharge Instructions: Apply three layer compression as directed. Pad bend of ankle with foam or ABD pad. WOUND #69: - Foot Wound Laterality: Dorsal, Right Peri-Wound Care: Zinc Oxide Ointment 30g tube 2 x Per Week/30 Days Discharge Instructions: Apply Zinc Oxiide to weeping areas with each dressing change Peri-Wound Care: Sween Lotion (Moisturizing lotion) (Home Health) 2 x Per Week/30 Days Discharge Instructions: Apply moisturizing lotion as directed Secondary Dressing: Zetuvit Plus 4x4 in 2 x Per Week/30 Days Discharge Instructions: or equivalent extra absorbent pad.Apply over primary dressing as directed. Secondary Dressing: Zetuvit Plus 4x8 in 2 x Per Week/30 Days Discharge Instructions: or equivalent extra absorbent pad. Apply over primary dressing as directed. Com pression Wrap: ThreePress (3 layer compression wrap) (Home Health) 2 x Per Week/30 Days Discharge Instructions: Apply three layer compression as directed. Pad bend of ankle with foam or ABD pad. 1. Would recommend currently that we going continue with the wound care measures as before and the patient is in agreement the plan this includes the use of the XtraSorb to control drainage. 2. I am also can recommend that we continue with the zinc oxide to protect the good skin. 3. We will also continue with 3 layer compression wraps help with edema control. 4. I also obtained a wound culture and we are going to send in a prescription for Levaquin for the patient she is taken this before without complication. We will see patient back for  reevaluation in 1 week here in the clinic. If anything worsens or changes patient will contact our office for additional recommendations. Electronic Signature(s) Signed: 08/08/2020 12:48:06 PM By: [name] PA-C Entered By: [name] on 08/08/2020 12:48:06 -------------------------------------------------------------------------------- SuperBill Details Patient Name: Date of Service: [name] 08/08/2020 Medical Record Number: [id number] Patient Account Number: [id number] Date of Birth/Sex: Treating RN: 1950-07-15 (70 y.o. [name] Primary Care Provider: [name] Other Clinician: Referring Provider: Treating Provider/Extender: Darlen Round in Treatment: 157 Diagnosis Coding ICD-10 Codes Code Description E11.622 Type 2 diabetes mellitus with other skin ulcer I89.0 Lymphedema, not elsewhere classified I87.331 Chronic venous hypertension (idiopathic) with ulcer and inflammation of right lower extremity I87.332 Chronic venous hypertension (idiopathic) with ulcer and inflammation of left lower extremity L97.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 leg with fat layer exposed L97.822 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 I10 Essential (primary) hypertension E66.01 Morbid (severe) obesity due to excess calories F41.8 Other specified anxiety disorders R53.1 Weakness Facility Procedures The patient participates with Medicare or their insurance follows the Medicare Facility Guidelines: CPT4 Description Modifier Quantity Code [id number] BILATERAL: Application of multi-layer venous compression system; leg (below knee), including ankle and 1 foot. Physician Procedures : CPT4 Code Description Modifier [id number] - WC PHYS LEVEL 4 - EST PT ICD-10 Diagnosis Description E11.622 Type 2 diabetes  mellitus with other skin ulcer I89.0 Lymphedema, not elsewhere classified I87.331 Chronic venous hypertension (idiopathic)  with  ulcer and inflammation of right lower extremity I87.332 Chronic venous hypertension (idiopathic) with ulcer and inflammation of left lower extremity Quantity: 1 Electronic Signature(s) Signed: 08/08/2020 12:48:22 PM By: [name] PA-C Entered By: [name] on 08/08/2020 12:48:20

## 2020-08-13 LAB — AEROBIC/ANAEROBIC CULTURE W GRAM STAIN (SURGICAL/DEEP WOUND): Gram Stain: NONE SEEN

## 2020-08-13 NOTE — Progress Notes (Signed)
[name], [name] ([id number]) Visit Report for 08/08/2020 Arrival Information Details Patient Name: Date of Service: [name], [name] 08/08/2020 10:30 A M Medical Record Number: [id number] Patient Account Number: [id number] Date of Birth/Sex: Treating RN: 11-05-1950 (70 y.o. [name] Primary Care Provider: [name] Other Clinician: Referring Provider: Treating Provider/Extender: Darlen Round in Treatment: 64 Visit Information History Since Last Visit Added or deleted any medications: No Patient Arrived: Wheel Chair Any new allergies or adverse reactions: No Arrival Time: 10:49 Had a fall or experienced change in No Accompanied By: alone activities of daily living that may affect Transfer Assistance: None risk of falls: Patient Identification Verified: Yes Signs or symptoms of abuse/neglect since last visito No Secondary Verification Process Completed: Yes Hospitalized since last visit: No Patient Requires Transmission-Based Precautions: No Implantable device outside of the clinic excluding No Patient Has Alerts: Yes cellular tissue based products placed in the center Patient Alerts: R ABI= 1.01 since last visit: L ABI = .99 Has Dressing in Place as Prescribed: Yes Has Compression in Place as Prescribed: Yes Pain Present Now: Yes Electronic Signature(s) Signed: 08/13/2020 5:56:44 PM By: Levan Hurst RN, BSN Entered By: Levan Hurst on 08/08/2020 10:49:41 -------------------------------------------------------------------------------- Compression Therapy Details Patient Name: Date of Service: [name]. 08/08/2020 10:30 A M Medical Record Number: [id number] Patient Account Number: [id number] Date of Birth/Sex: Treating RN: [birth year] (70 y.o. [name] Primary Care Provider: [name] Other Clinician: Referring Provider: Treating Provider/Extender: Darlen Round in Treatment: 157 Compression Therapy  Performed for Wound Assessment: Wound #67 Right,Medial Lower Leg Performed By: Clinician Rhae Hammock, RN Compression Type: Three Layer Post Procedure Diagnosis Same as Pre-procedure Electronic Signature(s) Signed: 08/08/2020 5:21:35 PM By: [name] RN, BSN Entered By: [name] on 08/08/2020 11:30:31 -------------------------------------------------------------------------------- Compression Therapy Details Patient Name: Date of Service: [name]. 08/08/2020 10:30 A M Medical Record Number: [id number] Patient Account Number: [id number] Date of Birth/Sex: Treating RN: 1950/10/09 (70 y.o. [name] Primary Care Provider: [name] Other Clinician: Referring Provider: Treating Provider/Extender: Darlen Round in Treatment: 157 Compression Therapy Performed for Wound Assessment: Wound #61 Left,Circumferential Lower Leg Performed By: Clinician Rhae Hammock, RN Compression Type: Three Layer Post Procedure Diagnosis Same as Pre-procedure Electronic Signature(s) Signed: 08/08/2020 5:21:35 PM By: [name] RN, BSN Entered By: [name] on 08/08/2020 11:30:31 -------------------------------------------------------------------------------- Encounter Discharge Information Details Patient Name: Date of Service: [name]. 08/08/2020 10:30 A M Medical Record Number: [id number] Patient Account Number: [id number] Date of Birth/Sex: Treating RN: 05/12/1951 (69 y.o. [name], [name] Primary Care Provider: [name] Other Clinician: Referring Provider: Treating Provider/Extender: Darlen Round in Treatment: [phone] Encounter Discharge Information Items Discharge Condition: Stable Ambulatory Status: Wheelchair Discharge Destination: Home Transportation: Private Auto Accompanied By: self Schedule Follow-up Appointment: Yes Clinical Summary of Care: Electronic Signature(s) Signed: 08/08/2020  5:17:36 PM By: Deon Pilling Entered By: Deon Pilling on 08/08/2020 11:56:52 -------------------------------------------------------------------------------- Lower Extremity Assessment Details Patient Name: Date of Service: [name], [name] 08/08/2020 10:30 A M Medical Record Number: [id number] Patient Account Number: [id number] Date of Birth/Sex: Treating RN: 1951-05-18 (70 y.o. [name] Primary Care Provider: [name] Other Clinician: Referring Provider: Treating Provider/Extender: Darlen Round in Treatment: 157 Edema Assessment Assessed: [name]: No] [Right: No] Edema: [Left: Yes] [Right: Yes] Calf Left: Right: Point of Measurement: 37 cm From Medial Instep 38.5 cm 38.5 cm Ankle Left: Right: Point of Measurement: 10 cm  From Medial Instep 30.5 cm 24.2 cm Vascular Assessment Pulses: Dorsalis Pedis Palpable: [Left:Yes] [Right:Yes] Electronic Signature(s) Signed: 08/13/2020 5:56:44 PM By: Levan Hurst RN, BSN Entered By: Levan Hurst on 08/08/2020 11:00:29 -------------------------------------------------------------------------------- [place] Details Patient Name: Date of Service: [name]. 08/08/2020 10:30 A M Medical Record Number: [id number] Patient Account Number: [id number] Date of Birth/Sex: Treating RN: 04/20/1951 (70 y.o. [name] Primary Care Provider: [name] Other Clinician: Referring Provider: Treating Provider/Extender: Darlen Round in Treatment: [place] reviewed with physician Active Inactive Venous Leg Ulcer Nursing Diagnoses: Actual venous Insuffiency (use after diagnosis is confirmed) Knowledge deficit related to disease process and management Goals: Patient will maintain optimal edema control Date Initiated: 08/12/2017 Target Resolution Date: 08/22/2020 Goal Status: Active Patient/caregiver will verbalize understanding of  disease process and disease management Date Initiated: 08/12/2017 Date Inactivated: 04/21/2018 Target Resolution Date: 04/24/2018 Goal Status: Met Interventions: Assess peripheral edema status every visit. Compression as ordered Treatment Activities: Therapeutic compression applied : 08/12/2017 Notes: Wound/Skin Impairment Nursing Diagnoses: Impaired tissue integrity Knowledge deficit related to ulceration/compromised skin integrity Goals: Patient/caregiver will verbalize understanding of skin care regimen Date Initiated: 08/12/2017 Target Resolution Date: 08/22/2020 Goal Status: Active Ulcer/skin breakdown will have a volume reduction of 30% by week 4 Date Initiated: 08/05/2017 Date Inactivated: 09/30/2017 Target Resolution Date: 10/03/2017 Goal Status: Met Ulcer/skin breakdown will have a volume reduction of 50% by week 8 Date Initiated: 09/30/2017 Date Inactivated: 10/28/2017 Target Resolution Date: 10/28/2017 Goal Status: Met Interventions: Assess patient/caregiver ability to perform ulcer/skin care regimen upon admission and as needed Assess ulceration(s) every visit Provide education on ulcer and skin care Screen for HBO Treatment Activities: Patient referred to home care : 08/05/2017 Skin care regimen initiated : 08/05/2017 Topical wound management initiated : 08/05/2017 Notes: Electronic Signature(s) Signed: 08/08/2020 5:21:35 PM By: [name] RN, BSN Entered By: [name] on 08/08/2020 11:16:11 -------------------------------------------------------------------------------- Pain Assessment Details Patient Name: Date of Service: [name]. 08/08/2020 10:30 A M Medical Record Number: [id number] Patient Account Number: [id number] Date of Birth/Sex: Treating RN: August 21, 1950 (70 y.o. [name] Primary Care Provider: [name] Other Clinician: Referring Provider: Treating Provider/Extender: Darlen Round in Treatment:  [phone] Active Problems Location of Pain Severity and Description of Pain Patient Has Paino Yes Site Locations Pain Location: Pain in Ulcers With Dressing Change: Yes Duration of the Pain. Constant / Intermittento Intermittent Rate the pain. Current Pain Level: 4 Character of Pain Describe the Pain: Burning, Throbbing Pain Management and Medication Current Pain Management: Medication: Yes Cold Application: No Rest: No Massage: No Activity: No T.E.N.S.: No Heat Application: No Leg drop or elevation: No Is the Current Pain Management Adequate: Adequate How does your wound impact your activities of daily livingo Sleep: No Bathing: No Appetite: No Relationship With Others: No Bladder Continence: No Emotions: No Bowel Continence: No Work: No Toileting: No Drive: No Dressing: No Hobbies: No Engineer, maintenance) Signed: 08/13/2020 5:56:44 PM By: Levan Hurst RN, BSN Entered By: Levan Hurst on 08/08/2020 10:50:30 -------------------------------------------------------------------------------- Patient/Caregiver Education Details Patient Name: Date of Service: [name] 2/23/2022andnbsp10:30 [place] Record Number: [id number] Patient Account Number: [id number] Date of Birth/Gender: Treating RN: 07-26-1950 (70 y.o. [name] Primary Care Physician: [name] Other Clinician: Referring Physician: Treating Physician/Extender: Darlen Round in Treatment: [phone] Education Assessment Education Provided To: Patient Education Topics Provided Infection: Methods: Explain/Verbal Responses: Reinforcements needed, State content correctly Venous: Methods: Explain/Verbal Responses: Reinforcements  needed, State content correctly Wound/Skin Impairment: Methods: Explain/Verbal Responses: Reinforcements needed, State content correctly Electronic Signature(s) Signed: 08/08/2020 5:21:35 PM By: [name] RN, BSN Entered By:  [name] on 08/08/2020 11:20:32 -------------------------------------------------------------------------------- Wound Assessment Details Patient Name: Date of Service: [name]. 08/08/2020 10:30 A M Medical Record Number: [id number] Patient Account Number: [id number] Date of Birth/Sex: Treating RN: 12/20/1950 (70 y.o. [name] Primary Care Provider: [name] Other Clinician: Referring Provider: Treating Provider/Extender: Darlen Round in Treatment: 157 Wound Status Wound Number: 61 Primary Venous Leg Ulcer Etiology: Wound Location: Left, Circumferential Lower Leg Wound Open Wounding Event: Gradually Appeared Status: Date Acquired: 04/20/2019 Comorbid Asthma, Hypertension, Peripheral Arterial Disease, Peripheral Weeks Of Treatment: 68 History: Venous Disease, Type II Diabetes, Gout, Osteoarthritis Clustered Wound: Yes Photos Photo Uploaded By: Mikeal Hawthorne on 08/10/2020 14:31:01 Wound Measurements Length: (cm) 12 Width: (cm) 27 Depth: (cm) 0.1 Clustered Quantity: 2 Area: (cm) 254.469 Volume: (cm) 25.447 % Reduction in Area: -3274.9% % Reduction in Volume: -3274.9% Epithelialization: Medium (34-66%) Tunneling: No Undermining: No Wound Description Classification: Full Thickness Without Exposed Support Structures Wound Margin: Flat and Intact Exudate Amount: Medium Exudate Type: Serous Exudate Color: amber Foul Odor After Cleansing: No Slough/Fibrino Yes Wound Bed Granulation Amount: Large (67-100%) Exposed Structure Granulation Quality: Pink, Pale Fascia Exposed: No Necrotic Amount: None Present (0%) Fat Layer (Subcutaneous Tissue) Exposed: Yes Tendon Exposed: No Muscle Exposed: No Joint Exposed: No Bone Exposed: No Treatment Notes Wound #61 (Lower Leg) Wound Laterality: Left, Circumferential Cleanser Peri-Wound Care Zinc Oxide Ointment 30g tube Discharge Instruction: Apply Zinc Oxiide to weeping areas  with each dressing change Sween Lotion (Moisturizing lotion) Discharge Instruction: Apply moisturizing lotion as directed Topical Primary Dressing Secondary Dressing Zetuvit Plus 4x4 in Discharge Instruction: or equivalent extra absorbent pad.Apply over primary dressing as directed. Zetuvit Plus 4x8 in Discharge Instruction: or equivalent extra absorbent pad. Apply over primary dressing as directed. Secured With Compression Wrap ThreePress (3 layer compression wrap) Discharge Instruction: Apply three layer compression as directed. Pad bend of ankle with foam or ABD pad. Compression Stockings Add-Ons Electronic Signature(s) Signed: 08/13/2020 5:56:44 PM By: Levan Hurst RN, BSN Entered By: Levan Hurst on 08/08/2020 10:59:14 -------------------------------------------------------------------------------- Wound Assessment Details Patient Name: Date of Service: [name]. 08/08/2020 10:30 A M Medical Record Number: [id number] Patient Account Number: [id number] Date of Birth/Sex: Treating RN: [birth year] (70 y.o. [name] Primary Care Provider: [name] Other Clinician: Referring Provider: Treating Provider/Extender: Darlen Round in Treatment: 157 Wound Status Wound Number: 64 Primary Diabetic Wound/Ulcer of the Lower Extremity Etiology: Wound Location: Left, Dorsal Foot Wound Open Wounding Event: Gradually Appeared Status: Date Acquired: 06/01/2019 Comorbid Asthma, Hypertension, Peripheral Arterial Disease, Peripheral Weeks Of Treatment: 62 History: Venous Disease, Type II Diabetes, Gout, Osteoarthritis Clustered Wound: No Photos Photo Uploaded By: Mikeal Hawthorne on 08/10/2020 14:31:11 Wound Measurements Length: (cm) 5.8 Width: (cm) 6 Depth: (cm) 0.1 Area: (cm) 27.332 Volume: (cm) 2.733 % Reduction in Area: -16464.8% % Reduction in Volume: -5477.6% Epithelialization: Medium (34-66%) Tunneling: No Undermining: No Wound  Description Classification: Grade 1 Wound Margin: Flat and Intact Exudate Amount: Medium Exudate Type: Serous Exudate Color: amber Foul Odor After Cleansing: No Slough/Fibrino Yes Wound Bed Granulation Amount: Large (67-100%) Exposed Structure Granulation Quality: Pink, Pale Fascia Exposed: No Necrotic Amount: None Present (0%) Fat Layer (Subcutaneous Tissue) Exposed: Yes Tendon Exposed: No Muscle Exposed: No Joint Exposed: No Bone Exposed: No Treatment Notes Wound #64 (Foot) Wound Laterality: Dorsal, Left Cleanser  Peri-Wound Care Zinc Oxide Ointment 30g tube Discharge Instruction: Apply Zinc Oxiide to weeping areas with each dressing change Sween Lotion (Moisturizing lotion) Discharge Instruction: Apply moisturizing lotion as directed Topical Primary Dressing Secondary Dressing Zetuvit Plus 4x4 in Discharge Instruction: or equivalent extra absorbent pad.Apply over primary dressing as directed. Zetuvit Plus 4x8 in Discharge Instruction: or equivalent extra absorbent pad. Apply over primary dressing as directed. Secured With Compression Wrap ThreePress (3 layer compression wrap) Discharge Instruction: Apply three layer compression as directed. Pad bend of ankle with foam or ABD pad. Compression Stockings Add-Ons Electronic Signature(s) Signed: 08/13/2020 5:56:44 PM By: Levan Hurst RN, BSN Entered By: Levan Hurst on 08/08/2020 10:59:35 -------------------------------------------------------------------------------- Wound Assessment Details Patient Name: Date of Service: [name]. 08/08/2020 10:30 A M Medical Record Number: [id number] Patient Account Number: [id number] Date of Birth/Sex: Treating RN: 1951-05-04 (70 y.o. [name] Primary Care Provider: [name] Other Clinician: Referring Provider: Treating Provider/Extender: Darlen Round in Treatment: 157 Wound Status Wound Number: 67 Primary Diabetic Wound/Ulcer of the  Lower Extremity Etiology: Wound Location: Right, Medial Lower Leg Wound Open Wounding Event: Gradually Appeared Status: Date Acquired: 05/02/2020 Comorbid Asthma, Hypertension, Peripheral Arterial Disease, Peripheral Weeks Of Treatment: 14 History: Venous Disease, Type II Diabetes, Gout, Osteoarthritis Clustered Wound: No Photos Photo Uploaded By: Mikeal Hawthorne on 08/10/2020 14:31:52 Wound Measurements Length: (cm) 4 Width: (cm) 5.2 Depth: (cm) 0.1 Area: (cm) 16.336 Volume: (cm) 1.634 % Reduction in Area: -1055.3% % Reduction in Volume: -1058.9% Epithelialization: Small (1-33%) Tunneling: No Undermining: No Wound Description Classification: Grade 1 Wound Margin: Thickened Exudate Amount: Medium Exudate Type: Serous Exudate Color: amber Foul Odor After Cleansing: No Slough/Fibrino No Wound Bed Granulation Amount: Large (67-100%) Exposed Structure Granulation Quality: Pink, Pale Fascia Exposed: No Necrotic Amount: None Present (0%) Fat Layer (Subcutaneous Tissue) Exposed: Yes Tendon Exposed: No Muscle Exposed: No Joint Exposed: No Bone Exposed: No Treatment Notes Wound #67 (Lower Leg) Wound Laterality: Right, Medial Cleanser Peri-Wound Care Zinc Oxide Ointment 30g tube Discharge Instruction: Apply Zinc Oxiide to weeping areas with each dressing change Sween Lotion (Moisturizing lotion) Discharge Instruction: Apply moisturizing lotion as directed Topical Primary Dressing Secondary Dressing Zetuvit Plus 4x4 in Discharge Instruction: or equivalent extra absorbent pad.Apply over primary dressing as directed. Zetuvit Plus 4x8 in Discharge Instruction: or equivalent extra absorbent pad. Apply over primary dressing as directed. Secured With Compression Wrap ThreePress (3 layer compression wrap) Discharge Instruction: Apply three layer compression as directed. Pad bend of ankle with foam or ABD pad. Compression Stockings Add-Ons Electronic Signature(s) Signed:  08/13/2020 5:56:44 PM By: Levan Hurst RN, BSN Entered By: Levan Hurst on 08/08/2020 10:59:55 -------------------------------------------------------------------------------- Wound Assessment Details Patient Name: Date of Service: [name]. 08/08/2020 10:30 A M Medical Record Number: [id number] Patient Account Number: [id number] Date of Birth/Sex: Treating RN: 1951-04-08 (70 y.o. [name] Primary Care Provider: [name] Other Clinician: Referring Provider: Treating Provider/Extender: Darlen Round in Treatment: 157 Wound Status Wound Number: 69 Primary Lymphedema Etiology: Wound Location: Right, Dorsal Foot Secondary Diabetic Wound/Ulcer of the Lower Extremity Wounding Event: Gradually Appeared Etiology: Date Acquired: 07/25/2020 Wound Open Weeks Of Treatment: 2 Status: Clustered Wound: No Comorbid Asthma, Hypertension, Peripheral Arterial Disease, Peripheral History: Venous Disease, Type II Diabetes, Gout, Osteoarthritis Photos Photo Uploaded By: Mikeal Hawthorne on 08/10/2020 14:32:04 Wound Measurements Length: (cm) 6.5 Width: (cm) 4 Depth: (cm) 0.1 Area: (cm) 20.42 Volume: (cm) 2.042 % Reduction in Area: -549.9% % Reduction in Volume: -550.3% Epithelialization: Small (  1-33%) Tunneling: No Undermining: No Wound Description Classification: Full Thickness Without Exposed Support Structures Wound Margin: Distinct, outline attached Exudate Amount: Medium Exudate Type: Purulent Exudate Color: yellow, brown, green Foul Odor After Cleansing: No Slough/Fibrino No Wound Bed Granulation Amount: Large (67-100%) Exposed Structure Granulation Quality: Pink, Pale Fascia Exposed: No Necrotic Amount: None Present (0%) Fat Layer (Subcutaneous Tissue) Exposed: Yes Tendon Exposed: No Muscle Exposed: No Joint Exposed: No Bone Exposed: No Treatment Notes Wound #69 (Foot) Wound Laterality: Dorsal, Right Cleanser Peri-Wound  Care Zinc Oxide Ointment 30g tube Discharge Instruction: Apply Zinc Oxiide to weeping areas with each dressing change Sween Lotion (Moisturizing lotion) Discharge Instruction: Apply moisturizing lotion as directed Topical Primary Dressing Secondary Dressing Zetuvit Plus 4x4 in Discharge Instruction: or equivalent extra absorbent pad.Apply over primary dressing as directed. Zetuvit Plus 4x8 in Discharge Instruction: or equivalent extra absorbent pad. Apply over primary dressing as directed. Secured With Compression Wrap ThreePress (3 layer compression wrap) Discharge Instruction: Apply three layer compression as directed. Pad bend of ankle with foam or ABD pad. Compression Stockings Add-Ons Electronic Signature(s) Signed: 08/13/2020 5:56:44 PM By: Levan Hurst RN, BSN Entered By: Levan Hurst on 08/08/2020 11:00:15 -------------------------------------------------------------------------------- [place] Details Patient Name: Date of Service: [name]. 08/08/2020 10:30 A M Medical Record Number: [id number] Patient Account Number: [id number] Date of Birth/Sex: Treating RN: [birth year] (70 y.o. [name] Primary Care Provider: [name] Other Clinician: Referring Provider: Treating Provider/Extender: Darlen Round in Treatment: 157 Vital Signs Time Taken: 10:49 Temperature (F): 99.1 Height (in): 62 Pulse (bpm): 103 Weight (lbs): 335 Respiratory Rate (breaths/min): 20 Body Mass Index (BMI): 61.3 Blood Pressure (mmHg): 125/75 Capillary Blood Glucose (mg/dl): 128 Reference Range: 80 - 120 mg / dl Notes glucose per pt report Electronic Signature(s) Signed: 08/13/2020 5:56:44 PM By: Levan Hurst RN, BSN Entered By: Levan Hurst on 08/08/2020 10:50:11

## 2020-08-22 ENCOUNTER — Encounter (HOSPITAL_BASED_OUTPATIENT_CLINIC_OR_DEPARTMENT_OTHER): Payer: Medicare PPO | Admitting: Physician Assistant

## 2020-08-29 ENCOUNTER — Encounter (HOSPITAL_BASED_OUTPATIENT_CLINIC_OR_DEPARTMENT_OTHER): Payer: Medicare PPO | Attending: Physician Assistant | Admitting: Physician Assistant

## 2020-08-29 ENCOUNTER — Other Ambulatory Visit: Payer: Self-pay

## 2020-08-29 DIAGNOSIS — F418 Other specified anxiety disorders: Secondary | ICD-10-CM | POA: Diagnosis not present

## 2020-08-29 DIAGNOSIS — I890 Lymphedema, not elsewhere classified: Secondary | ICD-10-CM | POA: Diagnosis not present

## 2020-08-29 DIAGNOSIS — I87331 Chronic venous hypertension (idiopathic) with ulcer and inflammation of right lower extremity: Secondary | ICD-10-CM | POA: Diagnosis not present

## 2020-08-29 DIAGNOSIS — Z9114 Patient's other noncompliance with medication regimen: Secondary | ICD-10-CM | POA: Diagnosis not present

## 2020-08-29 DIAGNOSIS — L97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 leg with fat layer exposed: Secondary | ICD-10-CM | POA: Insufficient documentation

## 2020-08-29 DIAGNOSIS — L97522 Non-pressure chronic ulcer of other part of left foot with fat layer exposed: Secondary | ICD-10-CM | POA: Diagnosis not present

## 2020-08-29 DIAGNOSIS — E11622 Type 2 diabetes mellitus with other skin ulcer: Secondary | ICD-10-CM | POA: Diagnosis not present

## 2020-08-29 DIAGNOSIS — I87332 Chronic venous hypertension (idiopathic) with ulcer and inflammation of left lower extremity: Secondary | ICD-10-CM | POA: Insufficient documentation

## 2020-08-29 DIAGNOSIS — L97822 Non-pressure chronic ulcer of other part of left lower leg with fat layer exposed: Secondary | ICD-10-CM | POA: Insufficient documentation

## 2020-08-29 DIAGNOSIS — I10 Essential (primary) hypertension: Secondary | ICD-10-CM | POA: Insufficient documentation

## 2020-08-29 DIAGNOSIS — R531 Weakness: Secondary | ICD-10-CM | POA: Insufficient documentation

## 2020-08-29 NOTE — Progress Notes (Addendum)
ANALYSSE, QUINONEZ ([id number]) Visit Report for 08/29/2020 Chief Complaint Document Details Patient Name: Date of Service: [name], FREER 08/29/2020 10:30 A M Medical Record Number: [id number] Patient Account Number: [id number] Date of Birth/Sex: Treating RN: 1951-03-13 (70 y.o. [name] Primary Care Provider: [name] Other Clinician: Referring Provider: Treating Provider/Extender: Darlen Round in Treatment: 160 Information Obtained from: Patient Chief Complaint Bilateral reoccurring LE ulcers Electronic Signature(s) Signed: 08/29/2020 11:04:20 AM By: [name] PA-C Entered By: [name] on 08/29/2020 11:04:20 -------------------------------------------------------------------------------- HPI Details Patient Name: Date of Service: [name]. 08/29/2020 10:30 A M Medical Record Number: [id number] Patient Account Number: [id number] Date of Birth/Sex: Treating RN: [birth year] (70 y.o. [name] Primary Care Provider: [name] Other Clinician: Referring Provider: Treating Provider/Extender: Darlen Round in Treatment: 160 History of Present Illness HPI Description: this patient has been seen a couple of times before and returns with recurrent problems to her right and left lower extremity with swelling and weeping ulcerations due to not wearing her compression stockings which she had been advised to do during her last discharge, at the end of June 2018. During her last visit the patient had had normal arterial blood flow and her venous reflux study did not necessitate any surgical intervention. She was recommended compression and elevation and wound care. After prolonged treatment the patient was completely healed but she has been noncompliant with wearing or compressions.. She was here last week with an outpatient return visit planned but the patient came in a very poor general condition with altered  mental status and was rushed to the ER on my request. With a history of hypertension, diabetes, TIA and right-sided weakness she was set up for an MRI on her brain and cervical spine and was sent to [facility]. Getting an MRI done was very difficult but once the workup was done she was found not to have any spinal stenosis, epidural abscess or hematoma or discitis. This was radiculopathy to be treated as an outpatient and she was given a follow-up appointment. Today she is feeling much better alert and oriented and has come to reevaluate her bilateral lower extremity lymphedema and ulceration 03/25/2017 -- she was admitted to the hospital on 03/16/2017 and discharged on 03/18/2017 with left leg cellulitis and ulceration. She was started on vancomycin and Zosyn and x-ray showed no bony involvement. She was treated for a cellulitis with IV antibiotics changed to Rocephin and Flagyl and was discharged on oral Keflex and doxycycline to complete a 7 day course. Last hemoglobin A1c was 7.1 and her other ailments including hypertension got asthma were appropriately treated. 05/06/2017 -- she is awaiting the right size of compression stockings from [place] but other than that has been doing well. ====== Old notes 70 year old patient was seen one time last October and was lost to follow-up. She has recurrent problems with weeping and ulceration of her left lower extremity and has swelling of this for several years. It has been worse for the last 2 months. Past medical history is significant for diabetes mellitus type 2, hypertension, gout, morbid obesity, depressive disorders, hiatal hernia, migraines, status post knee surgery, risk of a cholecystectomy, vaginal hysterectomy and breast biopsy. She is not a smoker. As noted before she has never had a venous duplex study and an arterial ABI study was attempted but the left lower extremity was noncompressible 10/01/2016 -- had a lower extremity venous duplex  reflux evaluation which showed  no evidence of deep vein reflux in the right or left lower extremity, and no evidence of great saphenous vein reflux more than 500 ms in the right or left lower extremity, and the left small saphenous vein is incompetent but no vascular consult was recommended. review of her electronic medical records noted that the ABI was checked in July 2017 where the right ABI was normal limits and the left ABI could not be ascertained due to pain with cuff pressure but the waveforms are within normal limits. her arterial duplex study scheduled for April 27. 10/08/2016 -- the patient has various reasons for not having a compression on and for the last 3 days she has had no compression on her left lower extremity either due to pain or the lack of nursing help. She does not use her juxta lites either. 10/15/2016 -- the patient did not keep her appointment for arterial duplex study on April 27 and I have asked her to reschedule this. Her pain is out of proportion with the physical findings and she continuously fails to wear a compression wraps and cuts them off because she says she cannot tolerate the pain. She does not use her juxta lites either. 10/22/2016 -- he has rescheduled her arterial duplex study to May 21 and her pain today is a bit better. She has not been wearing her juxta lites on her right lower extremity but now understands that she needs to do this. She did tolerate the to press compression wrap on her left lower extremity 10/29/2016 --arterial duplex study is scheduled for next week and overall she has been tolerating her compression wraps and also using her juxta lites on her right lower extremity 11/05/2016 -- the right ABI was 0.95 the left was 1.03. The digit TBI is on the right was 0.83 on the left was 0.92 and she had biphasic flow through these vessels. The impression was that of normal lower extremity arterial study. 11/12/2016 -- her pain is minimal and she  is doing very well overall. 11/26/2016 -- she has got juxta lites and her insurance will not pay for additional dual layer compression stockings. She is going to order some from [place]. 05/12/2017 -- her juxta lites are very old and too big for her and these have not been helping with compression. She did get 20-30 mm compression stockings from [place] but she and her husband are unable to put these on. I believe she will benefit from bilateral Extremit-ease, compression stockings and we will measure her for these today. 05/20/2017 -- lymphedema on the left lower extremity has increased a lot and she has a open ulceration as a result of this. The right lower extremity is looking pretty good. She has decided to by the compression stockings herself and will get reimbursed by the home health, at a later date. 05/27/2017 -- her sciatica is bothering her a lot and she thought her left leg pain was caused due to the compression wrap and hence removed it and has significant lymphedema. There is no inflammation on this left lower extremity. 06/17/17 on evaluation today patient appears to be doing very well and in fact is completely healed in regard to her ulcerations. Unfortunately however she does have continued issues with lymphedema nonetheless. We did order compression garments for her unfortunately she states that the size that she received were large although we ordered medium. Obviously this means she is not getting the optimal compression. She does not have those with her today and therefore  we could not confirm and contact the company on her behalf. Nonetheless she does state that she is going to have her husband bring them by tomorrow so that we can verify and then get in touch with the company. No fevers, chills, nausea, or vomiting noted at this time. Overall patient is doing better otherwise and I'm pleased with the progress she has made. 07/01/17 on evaluation today patient appears to be doing  very well in regard to her bilateral lower extremity she does not have any openings at this point which is excellent news. Overall I'm pleased with how things have progressed up to this time. Since she is doing so well we did order her compression which we are seeing her today to ensure that it fits her properly and everything is doing well in that regard and then subsequently she will be discharged. ============ Old Notes: 03/31/16 patient presents today for evaluation concerning open wounds that she has over the left medial ankle region as well as the left dorsal foot. She has previously had this occur although it has been healed for a number of months after having this for about a year prior until her hospitalization on 01/05/16. At that point in time it appears that she was admitted to the hospital for left lower extremity cellulitis and was placed on vancomycin and Zosyn at that point. Eventually upon discharge on January 15, 2016 she was placed on doxycycline at that point in time. Later on 03/27/16 positive wound culture growing Escherichia coli this was switched to amoxicillin. Currently she tells me that she is having pain radiated to be a 7 out of 10 which can be as high as 10 out of 10 with palpation and manipulation of the wound. This wound appears to be mainly venous in nature due to the bilateral lower extremity venous stasis/lymphedema. This is definitely much worse on her left than the right side. She does have type 1 diabetes mellitus, hypertension, morbid obesity, and is wheelchair dependent.during the course of the hospital stay a blood culture was also obtained and fortunately appeared negative. She also had an x-ray of the tibia/fibula on the left which showed no acute bone abnormality. Her white blood cell count which was performed last on 03/25/16 was 7.3, hemoglobin 12.8, protein 7.1, albumin 3.0. Her urine culture appeared to be negative for any specific organisms. Patient did  have a left lower extremity venous duplex evaluation for DVT . This did not include venous reflux studies but fortunately was negative for DVT Patient also had arterial studies performed which revealed that she had a . normal ABI on the right though this was unable to be performed on the left secondary to pain that she was having around the ankle region due to the wound. However it was stated on report that she had biphasic pulses and apparently good blood flow. ========== 06/03/17 she is here in follow-up evaluation for right lower extremity ulcer. The right lower sure he has healed but she has reopened to the left medial malleolus and dorsal foot with weeping. She is waiting for new compression garments to arrive from home health, the previous compression garments were ill fitting. We will continue with compression bilaterally and follow-up in 2 weeks Readmission: 08/05/17 on evaluation today patient appears to be doing somewhat poorly in regard to her left lower extremity especially although the right lower extremity has a small area which may no longer be open. She has been having a lot of drainage from the  left lower extremity however he tells me that she has not been able to use the EXTREMIT-EASE Compression at this point. She states that she did better and was able to actually apply the Juxta-Lite compression although the wound that she has is too large and therefore really does not compress which is why she cannot wear it at this point. She has no one who can help her put it on regular basis her son can sometimes but he's not able to do it most of the time. I do believe that's why she has begun to weave and have issues as she is currently yet again. No fevers, chills, nausea, or vomiting noted at this time. Patient is no evidence of dementia. 08/12/17 on evaluation today patient appears to still be doing fairly well in regard to the draining areas/weeping areas at this point. With that being  said she unfortunately did go to the ER yesterday due to what was felt to be possibly a cellulitis. They place her on doxycycline by mouth and discharge her home. She definitely was not admitted. With that being said she states she has had more discomfort which has been unusual for her even compared to prior times and she's had infections.08/12/17 on evaluation today patient appears to still be doing fairly well in regard to the draining areas/weeping areas at this point. With that being said she unfortunately did go to the ER yesterday due to what was felt to be possibly a cellulitis. They place her on doxycycline by mouth and discharge her home. She definitely was not admitted. With that being said she states she has had more discomfort which has been unusual for her even compared to prior times and she's had infections. 08/19/17 put evaluation today patient tells me that she's been having a lot of what sounds to be neuropathic type pain in regard to her left lower extremity. She has been using over-the-counter topical bins again which some believe. That in order to apply the she actually remove the wrap we put on her last Wednesday on Thursday. Subsequently she has not had anything on compression wise since that time. The good news is a lot of the weeping areas appear to have closed at this point again I believe she would do better with compression but we are struggling to get her to actually use what she needs to at this point. No fevers, chills, nausea, or vomiting noted at this time. 09/03/17 on evaluation today patient appears to be doing okay in regard to her lower extremities in regard to the lymphedema and weeping. Fortunately she does not seem to show any signs of infection at this point she does have a little bit of weeping occurring in the right medial malleolus area. With that being said this does not appear to be too significant which is good news. 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 She has severe skin damage secondary to both of these features involving the dorsal left foot and medial left ankle and lower leg. Still has open areas in the left anterior foot. The area on the right closed over. She uses her own juxta light stockings. She does not have an arterial issue 09/16/17 on evaluation today patient actually appears to be doing excellent in regard to her bilateral lower extremity swelling. The Juxta-Lite compression wrap seem to be doing very well for her. She has not however been using the portion that goes over her foot.  Her left foot still is draining a little bit not nearly as significant as it has been in the past but still I do believe that she likely needs to utilize the full wrap including the foot portion of this will improve as well. She also has been apparently putting on a significant amount of Vaseline which also think is not helpful for her. I recommended that if she feels she needs something for moisturizer Eucerin will probably be better. 09/30/17 on evaluation today patient presents with several new open areas in regard to her left lower extremity although these appear to be minimal and mainly seem to be more moisture breakdown than anything. Fortunately she does not seem to have any evidence of infection which is great news. She has been tolerating the dressing changes without complication we are using silver alginate on the foot she has been using AB pads to have the legs and using her Juxta- Lite compression which seems to be controlling her swelling very well. Overall I'm pleased with the poor way she has progressed. 10/14/17 on evaluation today patient appears to be doing better in regard to her left lower extremity areas of weeping. She does still have some discomfort although in general this does not appear to be as macerated and I think it is progressing nicely. I do think she still  needs to wear the foot portion of her Juxta- Lite in order to get the most benefit from the wrap obviously. She states she understands. Fortunately there does not appear to be evidence of infection at this time which is great news. 10/28/17 on evaluation today patient appears to be doing excellent in regard to her left lower extremity. She has just a couple areas that are still open and seem to be causing any trouble whatsoever. For that reason I think that she is definitely headed in the right direction the spots are very tiny compared to what we have been dealing with in the past. 11/11/17 on evaluation today patient appears to have a right lateral lower extremity ulcer that has opened since I last saw her. She states this is where the home health nurse that was coming out remove the dressing without wetting the alginate first. Nonetheless I do not know if this is indeed the case or not but more importantly we have not ordered home help to be coming out for her wounds at all. I'm unsure as to why they are coming out and we're gonna have to check on this and get things situated in that regard. With that being said we currently really do not need them to be coming out as the patient has been taking care of her leg herself without complication and no issues. In fact she was doing much better prior to nursing coming out. 11/25/17 on evaluation today patient actually appears to be doing fairly well in regard to her left lower extremity swelling. In fact she has very little area of weeping at this point there's just a small spot on the lateral portion of her right leg that still has me just a little bit more concerned as far as wanting to see this clear up before I discharge her to caring for this at home. Nonetheless overall she has made excellent progress. 12/09/17 on evaluation today patient appears to be doing rather well in regard to her lower extremity edema. She does have some weeping still in the left  lower extremity although the big area we were taking care of two  weeks ago actually has closed and she has another area of weeping on the left lower extremity immediately as well is the top of her foot. She does not currently have lymphedema pumps she has been wearing her compression daily on a regular basis as directed. With that being said I think she may benefit from lymphedema pumps. 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 We had a very difficult time getting and keeping this under control. 12/23/17 on evaluation today patient actually appears to be doing a little bit more poorly in regard to her bilateral lower extremities. She has been tolerating the Juxta-Lite compression wraps. Unfortunately she has two new ulcers on the right lower extremity and left lower Trinity ulceration seems to be larger. Obviously this is not good news. She has been tolerating the dressings without complication. 12/30/17 on evaluation today patient actually appears to be doing much better in regard to her bilateral lower extremity edema. She continues to have some issues with ulcerations and in fact there appears to be one spot on each leg where the wrap may have caused a little bit of a blister which is subsequently opened up at this point is given her pain. Fortunately it does not appear to be any evidence of infection which is good news. No fevers chills noted. 01/13/18 on evaluation today patient appears to be doing rather well in regard to her bilateral lower extremities. The dressings did get kind of stuck as far as the wound beds are concerned but again I think this is mainly due to the fact that she actually seems to be showing signs of healing which is good news. She's not having as much drainage therefore she was having more of the dressing sticking.  Nonetheless overall I feel like her swelling is dramatically down compared to previous. 01/20/18 on evaluation today patient unfortunately though she's doing better in most regards has a large blister on the left anterior lower extremity where she is draining quite significantly. Subsequently this is going to need debridement today in order to see what's underneath and ensure she does not continue to trapping fluid at this location. Nonetheless No fevers, chills, nausea, or vomiting noted at this time. 01/27/18 on evaluation today patient appears to be doing rather well at this point in regard to her right lower extremity there's just a very small area that she still has open at this point. With that being said I do believe that she is tolerating the compression wraps very well in making good progress. Home health is coming out at this point to see her. Her left lower extremity on the lateral portion is actually what still mainly open and causing her some discomfort for the most part 02/10/18 on evaluation today patient actually appears to be doing very well in regard to her right lower extremity were all the ulcers appear to be completely close. In regard to the left lower extremity she does have two areas still open and some leaking from the dorsal surface of her foot but this still seems to be doing much better to me in general. 02/24/18 on evaluation today patient actually appears to be doing much better in regard to her right lower extremity this is still completely healed. Her left lower extremity is also doing much better fortunately she has no evidence of infection. The one  area that is gonna require some debridement is still on the left anterior shin. Fortunately this is not hurting her as badly today. 03/10/18 on evaluation today patient appears to be doing better in some regards although she has a little bit more open area on the dorsal foot and she also has some issues on the medial portion of  the left lower extremity which is actually new and somewhat deep. With that being said there fortunately does not appear to be any significant signs of infection which is good news. No fevers, chills, nausea, or vomiting noted at this time. In general her swelling seems to be doing fairly well which is good news. 03/31/18 on evaluation today patient presents for follow-up concerning her left lower extremity lymphedema. Unfortunately she has been doing a little bit more poorly since I last saw her in regard to the amount of weeping that she is experiencing. She's also having some increased pain in the anterior shin location. Unfortunately I do not feel like the patient is making such good progress at this point a few weeks back she was definitely doing much better. 04/07/18 on evaluation today patient actually appears to be showing some signs of improvement as far as the left lower extremity is concerned. She has been tolerating the dressing changes and it does appear that the Drawtex did better for her. With that being said unfortunately home health is stating that they cannot obtain the Drawtex going forward. Nonetheless we're gonna have to check and see what they may be able to get the alginate they were using was getting stuck in causing new areas of skin being pulled all that with and subsequently weep and calls her to worsen overall this is the first time we've seen improvement at this time. 04/14/18 on evaluation today patient actually appears to be doing rather well at this point there does not appear to be any evidence of infection at this time and she is actually doing excellent in regard to the weeping in fact she almost has no openings remaining even compared to just last week this is a dramatic improvement. No fevers chills noted 04/21/18 evaluation today patient actually appears to be doing very well. She in fact is has a small area on the posterior lower extremity location and she has  a small area on the dorsal surface of her foot that are still open both of which are very close to closing. We're hoping this will be close shortly. She brought her Juxta-Lite wrap with her today hoping that would be able to put her in it unfortunately I don't think were quite at that point yet but we're getting closer. 04/28/18 upon evaluation today patient actually appears to be doing excellent in regard to her left lower extremity ulcer. In fact the region on the posterior lower extremity actually is much smaller than previously noted. Overall I'm very happy with the progress she has made. She again did bring her Juxta-Lite although we're not quite ready for that yet. 05/11/18 upon evaluation today patient actually appears to be doing in general fairly well in regard to her left lower Trinity. The swelling is very well controlled. With that being said she has a new area on the left anterior lower extremity as well as between the first and second toes of her left foot that was not present during the last evaluation. The region of her posterior left lower extremity actually appears to be almost completely healed. T be honest I'm very pleased  with o the way that stands. Nonetheless I do believe that the lotion may be keeping the area to moist as far as her legs are concerned subsequently I'm gonna consider discontinuing that today. 05/26/18 on evaluation today patient appears to be doing rather well in regard to her left lower should be ulcers. In fact everything appears to be close except for a very small area on the left posterior lower extremity. Fortunately there does not appear to be any evidence of infection at this time. Overall very pleased with her progress. 06/02/18 and evaluation today patient actually appears to be doing very well in regard to her lower extremity ulcers. She has one small area that still continues to weep that I think may benefit her being able to justify lotion and user  Juxta-Lite wraps versus continued to wrap her. Nonetheless I think this is something we can definitely look into at this point. 06/23/18 on evaluation today patient unfortunately has openings of her bilateral lower extremities. In general she seems to be doing much worse than when I last saw her just as far as her overall health standpoint is concerned. She states that her discomfort is mainly due to neuropathy she's not having any other issues otherwise. No fevers, chills, nausea, or vomiting noted at this time. 06/30/18 on evaluation today patient actually appears to be doing a little worse in regard to her right lower extremity her left lower extremity of doing fairly well. Fortunately there is no sign of infection at this time. She has been tolerating the dressing changes without complication. Home health did not come out like they were supposed to for the appropriate wrap changes. They stated that they never received the orders from [place] which were fax. Nonetheless we will send a copy of the orders with the patient today as well. 07/07/18 on evaluation today patient appears to be doing much better in regard to lower extremities. She still has several openings bilaterally although since I last saw her her legs did show obvious signs of infection when she later saw her nurse. Subsequently a culture was obtained and she is been placed on Bactrim and Keflex. Fortunately things seem to be looking much better it does appear she likely had an infection. Again last week we'd even discussed it but again there really was not any obvious sign that she had infection therefore we held off on the antibiotics. Nonetheless I'm glad she's doing better today. 07/14/18 on evaluation today patient appears to be doing much better regarding her bilateral lower Trinity's. In fact on the right lower for me there's nothing open at this point there are some dry skin areas at the sites where she had infection. Fortunately there is  no evidence of systemic infection which is excellent news. No fevers chills noted 07/21/18 on evaluation today patient actually appears to be doing much better in regard to her left lower extremity ulcers. She is making good progress and overall I feel like she's improving each time I see her. She's having no pain I do feel like the infection is completely resolved which is excellent news. No fevers, chills, nausea, or vomiting noted at this time. 07/28/18 on evaluation today patient appears to be doing very well in regard to her left lower [employer]. Everything seems to be showing signs of improvement which is excellent news. Overall very pleased with the progress that has been made. Fortunately there's no evidence of active infection at this time also excellent news. 08/04/18 on evaluation  today patient appears to be doing more poorly in regard to her bilateral lower extremities. She has two new areas open up on the right and these were completely closed as of last week. She still has the two spots on the left which in my pinion seem to be doing better. Fortunately there's no evidence of infection again at this point. 08/11/18 on evaluation today patient actually appears to be doing very well in regard to her bilateral lower Trinity wounds that all seem to be doing better and are measures smaller today. Fortunately there's no signs of infection. No fevers, chills, nausea, or vomiting noted at this time. 08/18/18 on evaluation today patient actually appears to be doing about the same inverter bilateral lower extremities. She continues to have areas that blistering open as was drain that fortunately nothing too significant. Overall I feel like Drawtex may have done better for her however compared to the collagen. 08/25/18 on evaluation today patient appears to be doing a little bit more poorly today even compared to last time I saw her. Again I'm not exactly sure why she's making worse progress over  the past several weeks. I'm beginning to wonder if there is some kind of underlying low level infection causing this issue. I did actually take a culture from the left anterior lower extremity but it was a new wound draining quite a bit at this point. Unfortunately she also seems to be having more pain which is what also makes me worried about the possibility of infection. This is despite never erythema noted at this point. 09/01/18 on evaluation today patient actually appears to be doing a little worse even compared to last week in regard to bilateral lower extremities. She did go to the hospital on the 16th was given a dose of IV Zosyn and then discharged with a recommendation to continue with the Bactrim that I previously prescribed for her. Nonetheless she is still having a lot of discomfort she tells me as well at this time. This is definitely unfortunate. No fevers, chills, nausea, or vomiting noted at this time. 09/08/18 on evaluation today patient's bilateral lower extremities actually appear to be shown signs of improvement which is good news. Fortunately there does not appear to be any signs of active infection I think the anabiotic is helping in this regard. Overall I'm very pleased with how she is progressing. 09/15/18 patient was actually seen in ER yesterday due to her legs as well unfortunately. She states she's been having a lot of pain and discomfort as well as a lot of drainage. Upon inspection today the patient does have a lot of swelling and drainage I feel like this is more related to lymphedema and poor fluid control than it is to infection based on what I'm seeing. The physician in the emergency department also doubted that the patient was having a significant infection nonetheless I see no evidence of infection obvious at this point although I do see evidence of poor fluid control. She still not using a compression pumps, she is not elevating due to her lift chair as well as her  hospital bed being broken, and she really is not keeping her legs up as much as they should be and also has been taking off her wraps. All this combined I think has led to poor fluid control and to be honest she may be somewhat volume overloaded in general as well. I recommend that she may need to contact your physician to see if a  prescription for a diuretic would be beneficial in their opinion. As long as this is safe I think it would likely help her. 09/29/18 on evaluation today patient's left lower extremity actually appears to be doing quite a bit better. At least compared to last time that I saw her. She still has a large area where she is draining from but there's a lot of new skin speckled trout and in fact there's more new skin that there are open areas of weeping and drainage at this point. This is good news. With regard to the right lower extremity this is doing much better with the only open area that I really see being a dry spot on the right lateral ankle currently. Fortunately there's no signs of active infection at this time which is good news. No fevers, chills, nausea, or vomiting noted at this time. The patient seems somewhat stressed and overwhelmed during the visit today she was very lethargic as such. She does and she is not taking any pain medications at this point. Apparently according to her husband are also in the process of moving which is probably taking its toll on her as well. 10/06/18 on evaluation today patient appears to be doing rather well in regard to her lower extremities compared to last evaluation. Fortunately there's no signs of active infection. She tells me she did have an appointment with her primary. Nonetheless he was concerned that the wounds were somewhat deep based on pictures but we never actually saw her legs. She states that he had her somewhat worried due to the fact that she was fearing now that she was [place] have to have an amputation. With that being  said based on what I'm seeing check she looks better this week that she has the last two times I've seen her with much less drainage I'm actually pleased in this regard. That doesn't mean that she's out of the water but again I do not think what the point of talking about education at all in regard to her leg. She is very happy to hear this. She is also not having as much pain as she was having last week. 10/13/18 unfortunately on evaluation today patient still continues to have a significant amount of drainage she's not letting home health actually apply the compression dressings at this point. She's trying to use of Juxta-Lite of the top of Kerlex and the second layer of the three layer compression wrap. With that being said she just does not seem to be making as good a progress as I would expect if she was having the compression applied and in place on a regular basis. No fevers, chills, nausea, or vomiting noted at this time. 10/20/18 on evaluation today patient appears to be doing a little better in regard to her bilateral lower extremity ulcers. In fact the right lower extremity seems to be healed she doesn't even have any openings at this point left lower extremity though still somewhat macerated seems to be showing signs of new skin growth at multiple locations throughout. Fortunately there's no evidence of active infection at this time. No fevers, chills, nausea, or vomiting noted at this time. 10/27/18 on evaluation today patient appears to be doing much better in regard to her left lower Trinity ulcer. She's been tolerating the laptop complication and has minimal drainage noted at this point. Fortunately there's no signs of active infection at this time. No fevers, chills, nausea, or vomiting noted at this time. 11/03/18 on evaluation today  patient actually appears to be doing excellent in regard to her left lower extremity. She is having very little drainage at this point there does not appear  to be any significant signs of infection overall very pleased with how things have gone. She is likewise extremely pleased still and seems to be making wonderful progress week to week. I do believe antibiotics were helpful for her. Her primary care provider did place on amateur clean since I last saw her. 11/17/18 on evaluation today patient appears to be doing worse in regard to her bilateral lower extremities at this point. She is been tolerating the dressing changes without complication. With that being said she typically takes the Coban off fairly quickly upon arriving home even after being seen here in the clinic and does not allow home health reapply command as part of the dressing at home. Therefore she said no compression essentially since I last saw her as best I can tell. With that being said I think it shows and how much swelling she has in the open wounds that are noted at this point. Fortunately there's no signs of infection but unfortunately if she doesn't get this under control I think she will end up with infection and more significant issues. 11/24/18 on evaluation today patient actually appears to be doing somewhat better in regard to her bilateral lower extremities. 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 Nonetheless that is doing better currently so she should be able to attempt reinitiating the lymphedema pumps at this time. No fevers, chills, nausea, or vomiting noted at this time. 12/01/18 upon evaluation today patient's left lower extremity appears to be doing quite well unfortunately her right lower extremity is not doing nearly as well. She has been tolerating the dressing changes without complication unfortunately she did not keep a wrap on the right at this time. Nonetheless I believe this has led to increased swelling and weeping in  the world is actually much larger than during the last evaluation with her. 12/08/18 on evaluation today patient appears to be doing about the same at this point in regard to her right lower extremity. There is some more palatable to touch I'm concerned about the possibility of there being some infection although I think the main issue is she's not keeping her compression wrap on which in turn is not allowing this area to heal appropriately. 12/22/18 on evaluation today patient appears to be doing better in regard to left lower extremity unfortunately significantly worse in regard to the right lower extremity. The areas of blistering and necrotic superficial tissue have spread and again this does not really appear to be signs of infection and all she just doesn't seem to be doing nearly as well is what she has been in the past. Overall I feel like the Augmentin did absolutely nothing for her she doesn't seem to have any infection again I really didn't think so last time either is more of a potential preventative measure and hoping that this would make some difference but I think the main issue is she's not wearing her compression. She tells me she cannot wear the [place] been we put on she takes it off pretty much upon getting home. Subsequently she worshiped Juxta-Lite when I questioned her about how often she wears it this is no  more than three hours a day obviously that leaves 21 hours that she has no compression and this is obviously not doing well for her. Overall I'm concerned that if things continue to worsen she is at great risk of both infection as well as losing her leg. 01/05/19 on evaluation today patient appears to be doing well in regard to her left lower extremity which he is allowing [place] to wrap and not so well with regard to her right lower extremity which she is not allowing [place] to really wrap and keep the wrap on. She states that it hurts too badly whenever it's wrapped and she ends up  having to take it off. She's been using the Juxta-Lite she tells me up to six hours a day although I question whether or not that's really been the case to be honest. Previously she told me three hours today nonetheless obviously the legs as long as the wrap is doing great when she is not is doing much more poorly. 01/12/2019 on evaluation today patient actually appears to be doing a little better in my opinion with regard to her right lower extremity ulcer. She has a small open area on the left lower extremity unfortunately but again this I think is part of the normal fluctuation of what she is going to have to expect with regard to her legs especially when she is not using her lymphedema pumps on a regular basis. Subsequently based on what I am seeing today I think that she does seem to be doing slightly better with regard to her right lower extremity she did see her primary care provider on Monday they felt she had an infection and placed her on 2 antibiotics. Both Cipro and clindamycin. Subsequently again she seems possibly to be doing a little bit better in regards to the right lower extremity she also tells me however she has been wearing the compression wrap over the past week since I spoke with her as well that is a Kerlix and Coban wrap on the right. No fevers, chills, nausea, vomiting, or diarrhea. 01/19/2019 on evaluation today patient appears to be doing better with regard to her bilateral lower extremities especially the right. I feel like the compression has been beneficial for her which is great news. She did get a call from her primary care provider on her way here today telling her that she did have methicillin- resistant Staphylococcus aureus and he was calling in a couple new antibiotics for her including a ointment to be applied she tells me 3 times a day. With that being said this sounds like likely to be Bactroban which I think could be applied with each dressing/wrap change but I  would not be able to accommodate her applying this 3 times a day. She is in agreement with the least doing this we will add that to her orders today. 01/26/2019 on evaluation today patient actually appears to be doing much better with regard to her right lower extremity. Her left lower extremity is also doing quite well all things considering. Fortunately there is no evidence of active infection at this time. No fevers, chills, nausea, vomiting, or diarrhea. 02/02/2019 on evaluation today patient appears to be doing much better compared to her last evaluation. Little by little off like her right leg is returning more towards normal. There does not appear to be any signs of active infection and overall she seems to be doing quite well which is great news. I am very pleased  in this regard. No fevers, chills, nausea, vomiting, or diarrhea. 02/09/2019 upon evaluation today patient appears to be doing better with regard to her bilateral lower extremities. She has been tolerating the dressing changes without complication. Fortunately there is no signs of active infection at this time. No fevers, chills, nausea, vomiting, or diarrhea. 02/23/2019 on evaluation today patient actually appears to be doing quite well with regard to her bilateral lower extremities. She has been tolerating the dressing changes without complication. She is even used her pumps one time and states that she really felt like it felt good. With that being said she seems to be in good spirits and her legs appear to be doing excellent. 03/09/2019 on evaluation today patient appears to be doing well with regard to her right lower extremity there are no open wounds at this time she is having some discomfort but I feel like this is more neuropathy than anything. With regard to her left lower extremity she had several areas scattered around that she does have some weeping and drainage from but again overall she does not appear to be having any  significant issues and no evidence of infection at this time which is good news. 03/23/2019 on evaluation today patient appears to be doing well with regard to her right lower extremity which she tells me is still close she is using her juxta light here. Her left lower extremity she mainly just has an area on the foot which is still slightly draining although this also is doing great. Overall very pleased at this time. 04/06/2019 patient appears to be doing a little bit worse in regard to her left lower extremity upon evaluation today. She feels like this could be becoming infected again which she had issues with previous. Fortunately there is no signs of systemic infection but again this is always a struggle with her with her legs she will go from doing well to not so well in a very short amount of time. 04/20/2019 on evaluation today patient actually appears to be doing quite well with regard to her right lower extremity I do not see any signs of active infection at this time. Fortunately there is no fever chills noted. She is still taking the antibiotics which I prescribed for her at this point. In regard to the left lower extremity I do feel like some of these areas are better although again she still is having weeping from several locations at this time. 04/27/2019 on evaluation today patient appears to be doing about the same if not slightly worse in regard to her left lower extremity ulcers. She tells me when questioned that she has been sleeping in her Hoveround chair in fact she tells me she falls asleep without even knowing it. I think she is spending a whole lot of time in the chair and less time walking and moving around which is not good for her legs either. On top of that she is in a seated position which is also the worst position she is not really elevating her legs and she is also not using her lymphedema pumps. All this is good to contribute to worsening of her condition in  general. 05/18/2019 on evaluation today patient appears to be doing well with regard to her lower extremity on the right in fact this is showing no signs of any open wounds at this time. On the left she is continuing to have issues with areas that do drain. Some of the regions have healed and  there are couple areas that have reopened. She did go to the ER per the patient according to recommendations from the home health nurse due to what she was seen when she came out on 05/13/2019. Subsequently she felt like the patient needed to go to the hospital due to the fact that again she was having "milky white discharge" from her leg. Nonetheless she had and then was placed on doxycycline and subsequently seems to be doing better. 06/01/2019 upon evaluation today patient appears to be doing really in my opinion about the same. I do not see any signs of active infection which is good news. Overall she still has wounds over the bilateral lower extremities she has reopened on the right but this appears to be more of a crack where there is weeping/edema coming from the region. I do not see any evidence of infection at either site based on what I visualized today. 07/13/2019 upon evaluation today patient appears to be doing a little worse compared to last time I saw her. She since has been in the hospital from 06/21/2019 through 06/29/2019. This was secondary to having Covid. During that time they did apply lotion to her legs which unfortunately has caused her to develop a myriad of open wounds on her lower extremities. Her legs do appear to be doing better as far as the overall appearance is concerned but nonetheless she does have more open and weeping areas. 07/27/2019 upon evaluation today patient appears to be doing more poorly to be honest in regard to her left lower extremity in particular. There is no signs of systemic infection although I do believe she may have local infection. She notes she has been having  a lot of blue/green drainage which is consistent potentially with Pseudomonas. That may be something that we need to consider here as well. The doxycycline does not seem to have been helping. 08/03/2019 upon evaluation today patient appears to be doing a little better in my opinion compared to last week's evaluation. Her culture I did review today and she is on appropriate medications to help treat the Enterobacter that was noted. Overall I feel like that is good news. With that being said she is unfortunately continuing to have a lot of drainage and though it is doing better I still think she has a long ways to go to get things dried up in general. Fortunately there is no signs of systemic infection. 08/10/2019 upon evaluation today patient appears to be doing may be slightly better in regard to her left lower extremity the right lower extremity is doing much better. Fortunately there is no signs of infection right now which is good news. No fevers, chills, nausea, vomiting, or diarrhea. 08/24/2019 on evaluation today patient appears to be doing slightly better in regard to her lower extremities. The left lower extremity seems to be healed the right lower extremity is doing better though not completely healed as far as the openings are concerned. She has some generalized issues here with edema and weeping secondary to her lymphedema though again I do believe this is little bit drier compared to prior weeks evaluations. In general I am very pleased with how things seem to be progressing. No fevers, chills, nausea, vomiting, or diarrhea. 08/31/2019 upon evaluation today patient actually seems to making some progress here with regard to the left lower extremity in particular. She has been tolerating the dressing changes without complication. Fortunately there is no signs of active infection at this time. No fevers,  chills, nausea, vomiting, or diarrhea. She did see Dr. Doren Custard and he did note that she did have  a issue with the left great saphenous vein and the small saphenous vein in the leg. With that being said he was concerned about the possibility of laser ablation not being extremely successful. He also mentioned a small risk of DVT associated with the procedure. However if the wounds do not continue to improve he stated that that would probably be the way to go. Fortunately the patient's legs do seem to be doing much better. 09/07/2019 upon evaluation today patient appears to be doing better with regard to her lower extremities. She has been tolerating the dressing changes without complication. With that being said she is showing signs of improvement and overall very pleased. There are some areas on her leg that I think we do need to debride we discussed this last week the patient is in agreement with doing that as long as it does not hurt too badly. 09/14/2019 upon evaluation today patient appears to be doing decently well with regard to her left lower extremity. She is not having near as much weeping as she has had in the past things seem to be drying up which is good news. There is no signs of active infection at this time. 09/21/19 upon evaluation today patient appears to be doing better in regard overall to her bilateral lower extremities. She again has less open than she did previous and each week I feel like this is getting better. Fortunately there is no signs of active infection at this time. No fevers, chills, nausea, vomiting, or diarrhea. 09/28/2019 upon evaluation today patient actually appears to be showing signs of improvement with regard to her left lower extremity. Unfortunately the right medial lower extremity around the ankle region has reopened to some degree but this appears to be minimal still which is good news. There is no signs of active infection at this time which is also good news. 10/12/2019 upon evaluation today patient appears to be doing okay with regard to her bilateral  lower extremities today. The right is a little bit worse then last evaluation 2 weeks ago. The left is actually doing a little better in my opinion. Overall there is no signs of active infection at this time that I see. Obviously that something we have to keep a close eye on she is very prone to this with the significant and multiple openings that she has over the bilateral lower extremities. 10/19/2019 upon evaluation today patient appears to be doing about the best that I have seen her in quite some time. She has been tolerating the dressing changes without complication. There does not appear to be any signs of active infection and overall I am extremely happy with the way her legs appeared. She is drying up quite nicely and overall is having less pain. 11/09/2019 upon evaluation today patient appears to be doing better in regard to her wounds. She seems to be drying up more and more each time I see her this is just taking a very long time. Fortunately there is no signs of active infection at this time. 11/23/2019 upon evaluation today patient actually appears to be doing excellent in regard to her lower extremities at this point compared to where she has been. Fortunately there is no signs of active infection at this time. She did go to the hospital last week for nausea and vomiting completely unrelated to her wounds. Fortunately she is doing better  she was given some Reglan and got better. She had associated abdominal pain but they never found out what was going on. 12/07/2019 upon evaluation today patient appears to be doing well for the most part in regard to her legs. She unfortunately has not been keeping the [place] portion of her wraps on therefore the compression has not really been sufficient for what it is supposed to be. Nonetheless she tells me that it just hurt too bad therefore she removed it. 12/21/2019 upon evaluation today patient actually appears to be doing quite well with regard to her  legs. I do feel like she has been making progress which is great news and overall there is no signs of active infection at this time. No fevers, chills, nausea, vomiting, or diarrhea. 01/04/2020 upon evaluation today patient presents for follow-up concerning her lower extremity edema bilaterally. She still has open wounds she has not been using her lymphedema pumps. She is also not been utilizing her compression wraps appropriately she tends to unwrap them, take them off, or states that they hurt. Obviously the reason they hurt is because her legs start to swell but the issue is if she would use her compression/lymphedema pumps regularly she would not swell and she would have the pain. Nonetheless she has not even picked them up once honestly over the past several months and may be even as much as in the past year based on my opinion and what have seen. She tells me today that after last week when I talked about this with her specifically actually that was 2 weeks ago that she "forgot". 8//21 on evaluation today patient appears to be doing a little better in regard to her legs bilaterally. Fortunately there is no signs of active infection at this time. She tells me that she used her lymphedema pumps all of one time over the past 2 weeks since I last saw her. She tells me that she has been too busy in order to continue to use these. 02/01/2020 on evaluation today patient appears to be doing some better in regard to her wounds in general in her legs. We felt the right was healed although is not completely it does appear to be doing better she tells me she has been using her lymphedema pumps that she has had this six times since I last saw her. Obviously the more she does that the better she would do my opinion 02/15/2020 upon evaluation today patient appears to be doing about the same in regard to her legs. She tells me that she is pumping I'm still not sure how much she does to be perfectly honest. However  even if she does a little bit here and there I guess that is better than nothing. Fortunately there is no sign of active infection at this time which is great news. No fevers, chills, nausea, vomiting, or diarrhea. 02/29/2020 on evaluation today patient actually appears to be doing quite well all things considered this week. She has been tolerating the dressing changes without complication. Fortunately there is no signs of active infection at this time. No fevers, chills, nausea, vomiting, or diarrhea. 03/14/2020 upon evaluation today patient appears to be doing really about the same in regard to her legs. There is no signs of improvement overall and she as noted from home health does not appear to be elevating her legs he can get into her lift chair. There is too much stuff piled up on it the patient tells me. She also  tells me she cannot really use her pumps effectively due to the fact that she cannot have any space to get them on. Finally she is also not really elevating her legs because she is not sleeping in her bed she is sleeping in her chair currently and again overall I think everything that she is done in combination has been exactly the wrong thing for what she needs for her legs. 04/04/2020 upon evaluation today patient appears to be doing well at this time with regard to her legs. She is actually been pumping, keeping her wraps on, and to be honest she seems to be doing dramatically better the right leg is excellent the left leg is also excellent and measuring much smaller than previous. 04/18/2020 upon evaluation today patient actually is continue to make good progress in regard to her lower extremities bilaterally. Everything is improving and less wet that has been in the past overall I am extremely pleased with where things stand and I think that she is making great progress. The patient tells me she still continue to use her compression pumps 05/02/2020 on evaluation today patient appears  to be doing well at this time in regard to her left leg which is showing signs of drying up. With that being said she does have a lot of lymphedema type crusty skin around the toes of her left foot and the right medial ankle which has opened at this point. Fortunately there is no signs of active infection systemically at this point or even locally for that matter. 05/23/2020 on evaluation today patient appears to be doing well with regard to her lower extremities. Fortunately there is no signs of active infection at this time. No fever chills noted. She has been very depressed however she tells me. 06/06/2020 patient came in today for evaluation in regard to her bilateral lower extremity ulcerations. With that being said she came in feeling okay and actually laughing and joking around with the staff checking her in. Subsequently however she had a coughing spell and following the coughing spell it was a dramatic conversion from being jovial and joking around to being extremely short of breath her vital signs actually dropped in regard to her blood pressure from around 175 to down around 433 for systolic and from around 96 diastolic down to around 70. With that being said she also accompanied this with an increase in her respiratory rate which was also quite significant. Nonetheless I actually upon going into see her was extremely worried we called EMS to have her transported to the ER for further evaluation and treatment. She continued until EMS got here to be extremely short of breath even on 6 L of oxygen. Her oxygen saturation did come up to 99% but overall it was only 95 before which was not terrible she did feel like the oxygen helped her feel somewhat better however. She has a history of asthma but again even the coughing spell really should not have done this degree of alteration in her demeanor from where she was just before to after the coughing spell. She tells [place] however she has been feeling  somewhat abnormal since Friday although she could not really pinpoint exactly what was going on. 06/27/2020 plan evaluation today patient appears to be doing okay in regard to her leg ulcers. She is really not showing a lot of improvement to be honest and she still has a lot of weeping. With that being said after I last saw her we called  EMS to take her to the hospital she actually did not go she went to see her primary care provider who according to the patient have not identified and read through the note states that everything checked out okay. Nonetheless the patient has continued to have bouts where she gets very short of breath for seemingly no reason whatsoever. That happened even the same night that I saw her last time. This was after she refused transport to the hospital. With that being said she also tells me that just getting dressed to come here is quite a chore which is putting on her shirt causing her to have to stop to catch her breath. Obviously this is not normal and I feel like there is something going on. She may need a referral to be seen by cardiology ASAP. She does see her primary care provider early next week she tells me 07/11/2020 upon evaluation today patient's wounds again appear to be doing about the same she mainly has weeping of the bilateral lower extremities both are very swollen today she tells me she has been using her pumps but last time I saw her she told me she did not even have her pumps out that she had "packed them away because they had too much stuff in their house. Apparently her husband has been bringing stuff in from storage units. She then subsequently told me that she had so much stuff in her house that she has been staying up all night and did not go to bed till 7:00 this morning because she was cleaning up stuff. Nonetheless I am still unsure as to whether or not she is using the pumps based on what I am seeing I would think probably not. Nonetheless she  has been keeping the compression wraps in place that is at least something 07/25/2020 upon evaluation today patient appears to be doing well currently in regard to her legs all things considered. I do not think she is doing any worse significantly although honestly I do not think she is doing a lot better either. There does not appear to be any signs of infection which is good news although she does have a lot of weeping. She is using her pumps she tells me sporadically though she says that her husband is not a big fan of helping her to get the [place]. She also tells me is difficult because in their house. It would be easiest for her to use these as where her son is actually staying with her and living out at this point. Obviously I understand that she is having a lot of issues here and to be honest I do not really know what to say in that regard and the fact that I really cannot change the situations but I do believe she really needs to be using the lymphedema pumps to try to keep things under control here. Outside of that I think she is going to continue to have significant issues. I think the pumps coupled with good compression and elevation are the only things that are to help her at this point. 08/08/2020 upon evaluation today patient appears to be doing a little worse in regard to her legs in general. I think this may be due to some infection currently. With that being said I am going to go ahead and likely see about putting her on an antibiotic. Were also can obtain a culture today where she had some purulent drainage. 08/29/2020 upon evaluation today patient appears to  be doing about the same in regard to her bilateral lower extremities. Unfortunately she is continuing to have significant issues here with edema and she has significant lymphedema. With that being said she is really not doing anything that she is supposed to be doing as far as elevation, compression, using lymphedema pumps or  anything really for that matter. She does not use her pumps, is taken the wraps off shortly after having them put on as far as home health is concerned at least by the next day she tells me, and overall is not really elevating her legs she is telling me she has a lot to do as far as cleaning up in her house. Nonetheless this triad of noncompliance is leading to worsening not improving in regard to her legs. I had a very strong conversation with her about this today again. Electronic Signature(s) Signed: 08/29/2020 6:10:51 PM By: [name] PA-C Entered By: [name] on 08/29/2020 18:10:51 -------------------------------------------------------------------------------- Physical Exam Details Patient Name: Date of Service: JENASIA, DOLINAR 08/29/2020 10:30 A M Medical Record Number: [id number] Patient Account Number: [id number] Date of Birth/Sex: Treating RN: 1950/08/28 (70 y.o. [name] Primary Care Provider: [name] Other Clinician: Referring Provider: Treating Provider/Extender: Darlen Round in Treatment: 160 Constitutional Obese and well-hydrated in no acute distress. Respiratory normal breathing without difficulty. Psychiatric this patient is able to make decisions and demonstrates good insight into disease process. Alert and Oriented x 3. pleasant and cooperative. Notes Patient's wound bed actually showed signs of really having quite a bit of drainage over the bilateral lower extremities with open wounds which are severely tender to touch unfortunately. With that being said I am not really certain what she is really wanting from me I am not able to control everything that she is doing on compliantly at home and without focusing on these things herself she is not can get better. Electronic Signature(s) Signed: 08/29/2020 6:11:21 PM By: [name] PA-C Entered By: [name] on 08/29/2020  18:11:20 -------------------------------------------------------------------------------- Physician Orders Details Patient Name: Date of Service: [name]. 08/29/2020 10:30 A M Medical Record Number: [id number] Patient Account Number: [id number] Date of Birth/Sex: Treating RN: 1950/12/09 (70 y.o. [name] Primary Care Provider: [name] Other Clinician: Referring Provider: Treating Provider/Extender: Darlen Round in Treatment: [phone] Verbal / Phone Orders: No Diagnosis Coding ICD-10 Coding Code Description E11.622 Type 2 diabetes mellitus with other skin ulcer I89.0 Lymphedema, not elsewhere classified I87.331 Chronic venous hypertension (idiopathic) with ulcer and inflammation of right lower extremity I87.332 Chronic venous hypertension (idiopathic) with ulcer and inflammation of left lower extremity L97.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 leg with fat layer exposed L97.822 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 Bathing/ Shower/ Hygiene May shower and wash wound with soap and water. - with dressing changes, wash both legs with wash cloth and soap and water with dressing changes Edema Control - Lymphedema / SCD / Other Bilateral Lower Extremities Lymphedema Pumps. Use Lymphedema pumps on leg(s) 2-3 times a day for 45-60 minutes. If wearing any wraps or hose, do not remove them. Continue exercising as instructed. Elevate legs to the level  of the heart or above for 30 minutes daily and/or when sitting, a frequency of: Avoid standing for long periods of time. Exercise regularly Home Health No  change in wound care orders this week; continue Home Health for wound care. May utilize formulary equivalent dressing for wound treatment orders unless otherwise specified. Other Home Health Orders/Instructions: - [facility] Wound #61 - Lower Leg Wound Laterality: Left, Circumferential Peri-Wound Care: Zinc Oxide Ointment 30g tube 2 x Per Week/30 Days Discharge Instructions: Apply Zinc Oxiide to weeping areas with each dressing change Peri-Wound Care: Sween Lotion (Moisturizing lotion) (Home Health) 2 x Per Week/30 Days Discharge Instructions: Apply moisturizing lotion as directed Secondary Dressing: Zetuvit Plus 4x4 in 2 x Per Week/30 Days Discharge Instructions: or equivalent extra absorbent pad.Apply over primary dressing as directed. Compression Wrap: ThreePress (3 layer compression wrap) (Home Health) 2 x Per Week/30 Days Discharge Instructions: Apply three layer compression as directed. Pad bend of ankle with foam or ABD pad. Wound #64 - Foot Wound Laterality: Dorsal, Left Peri-Wound Care: Zinc Oxide Ointment 30g tube 2 x Per Week/30 Days Discharge Instructions: Apply Zinc Oxiide to weeping areas with each dressing change Peri-Wound Care: Sween Lotion (Moisturizing lotion) (Home Health) 2 x Per Week/30 Days Discharge Instructions: Apply moisturizing lotion as directed Secondary Dressing: Zetuvit Plus 4x4 in 2 x Per Week/30 Days Discharge Instructions: or equivalent extra absorbent pad.Apply over primary dressing as directed. Compression Wrap: ThreePress (3 layer compression wrap) (Home Health) 2 x Per Week/30 Days Discharge Instructions: Apply three layer compression as directed. Pad bend of ankle with foam or ABD pad. Wound #67 - Lower Leg Wound Laterality: Right, Medial Peri-Wound Care: Zinc Oxide Ointment 30g tube 2 x Per Week/30 Days Discharge Instructions: Apply Zinc Oxiide to weeping areas with each dressing change Peri-Wound Care: Sween Lotion (Moisturizing  lotion) (Home Health) 2 x Per Week/30 Days Discharge Instructions: Apply moisturizing lotion as directed Secondary Dressing: Zetuvit Plus 4x4 in 2 x Per Week/30 Days Discharge Instructions: or equivalent extra absorbent pad.Apply over primary dressing as directed. Compression Wrap: ThreePress (3 layer compression wrap) (Home Health) 2 x Per Week/30 Days Discharge Instructions: Apply three layer compression as directed. Pad bend of ankle with foam or ABD pad. Wound #69 - Foot Wound Laterality: Dorsal, Right Peri-Wound Care: Zinc Oxide Ointment 30g tube 2 x Per Week/30 Days Discharge Instructions: Apply Zinc Oxiide to weeping areas with each dressing change Peri-Wound Care: Sween Lotion (Moisturizing lotion) (Home Health) 2 x Per Week/30 Days Discharge Instructions: Apply moisturizing lotion as directed Secondary Dressing: Zetuvit Plus 4x4 in 2 x Per Week/30 Days Discharge Instructions: or equivalent extra absorbent pad.Apply over primary dressing as directed. Compression Wrap: ThreePress (3 layer compression wrap) (Home Health) 2 x Per Week/30 Days Discharge Instructions: Apply three layer compression as directed. Pad bend of ankle with foam or ABD pad. Wound #70 - Lower Leg Wound Laterality: Right, Anterior Peri-Wound Care: Zinc Oxide Ointment 30g tube 2 x Per Week/30 Days Discharge Instructions: Apply Zinc Oxiide to weeping areas with each dressing change Peri-Wound Care: Sween Lotion (Moisturizing lotion) (Home Health) 2 x Per Week/30 Days Discharge Instructions: Apply moisturizing lotion as directed Secondary Dressing: Zetuvit Plus 4x4 in 2 x Per Week/30 Days Discharge Instructions: or equivalent extra absorbent pad.Apply over primary dressing as directed. Compression Wrap: ThreePress (3 layer compression wrap) (Home Health) 2 x Per Week/30 Days Discharge Instructions: Apply three layer compression as directed. Pad bend of ankle with foam or ABD pad. Electronic Signature(s) Signed:  08/29/2020  6:22:43 PM By: [name] PA-C Signed: 08/30/2020 5:44:19 PM By: [name] RN, BSN Entered By: [name] on 08/29/2020 11:33:33 -------------------------------------------------------------------------------- Problem List Details Patient Name: Date of Service: [name]. 08/29/2020 10:30 A M Medical Record Number: [id number] Patient Account Number: [id number] Date of Birth/Sex: Treating RN: May 19, 1951 (70 y.o. Martyn Malay, Vaughan Basta Primary Care Provider: [name] Other Clinician: Referring Provider: Treating Provider/Extender: Darlen Round in Treatment: 160 Active Problems ICD-10 Encounter Code Description Active Date MDM Diagnosis E11.622 Type 2 diabetes mellitus with other skin ulcer 08/05/2017 No Yes I89.0 Lymphedema, not elsewhere classified 08/05/2017 No Yes I87.331 Chronic venous hypertension (idiopathic) with ulcer and inflammation of right 08/05/2017 No Yes lower extremity I87.332 Chronic venous hypertension (idiopathic) with ulcer and inflammation of left 08/05/2017 No Yes lower extremity L97.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 leg with fat layer 08/05/2017 No Yes exposed L97.822 Non-pressure chronic ulcer of other part of left lower leg with fat layer exposed2/20/2019 No Yes L97.522 Non-pressure chronic ulcer of other part of left foot with fat layer exposed 06/01/2019 No Yes I10 Essential (primary) hypertension 08/05/2017 No Yes E66.01 Morbid (severe) obesity due to excess calories 08/05/2017 No Yes F41.8 Other specified anxiety disorders 08/05/2017 No Yes R53.1 Weakness 08/05/2017 No Yes Inactive Problems Resolved Problems Electronic Signature(s) Signed: 08/29/2020 11:04:11 AM By: [name] PA-C Entered By: [name] on 08/29/2020 11:04:11 -------------------------------------------------------------------------------- Progress Note Details Patient Name: Date of Service: [name]. 08/29/2020 10:30 A M Medical Record Number: [id number] Patient Account Number: [id number] Date of Birth/Sex: Treating RN: 02/09/1951 (70 y.o. [name] Primary Care Provider: [name] Other Clinician: Referring Provider: Treating Provider/Extender: Darlen Round in Treatment: 160 Subjective Chief Complaint Information obtained from Patient Bilateral reoccurring LE ulcers History of Present Illness (HPI) this patient has been seen a couple of times before and returns with recurrent problems to her right and left lower extremity with swelling and weeping ulcerations due to not wearing her compression stockings which she had been advised to do during her last discharge, at the end of June 2018. During her last visit the patient had had normal arterial blood flow and her venous reflux study did not necessitate any surgical intervention. She was recommended compression and elevation and wound care. After prolonged treatment the patient was completely healed but she has been noncompliant with wearing or compressions.. She was here last week with an outpatient return visit planned but the patient came in a very poor general condition with altered mental status and was rushed to the ER on my request. With a history of hypertension, diabetes, TIA and right-sided weakness she was set up for an MRI on her brain and cervical spine and was sent to [facility]. Getting an MRI done was very difficult but once the workup was done she was found not to have any spinal stenosis, epidural abscess or hematoma or discitis. This was radiculopathy to be treated as an outpatient and she was given a follow-up appointment. Today she is feeling much better alert and oriented and has come to reevaluate her bilateral lower extremity lymphedema and ulceration 03/25/2017 -- she was admitted to the hospital on 03/16/2017 and discharged on 03/18/2017 with left leg cellulitis and  ulceration. She was started on vancomycin and Zosyn and x-ray showed no bony involvement. She was treated for a cellulitis with IV antibiotics changed to Rocephin and Flagyl and was discharged on oral Keflex  and doxycycline to complete a 7 day course. Last hemoglobin A1c was 7.1 and her other ailments including hypertension got asthma were appropriately treated. 05/06/2017 -- she is awaiting the right size of compression stockings from [place] but other than that has been doing well. ====== Old notes 70 year old patient was seen one time last October and was lost to follow-up. She has recurrent problems with weeping and ulceration of her left lower extremity and has swelling of this for several years. It has been worse for the last 2 months. Past medical history is significant for diabetes mellitus type 2, hypertension, gout, morbid obesity, depressive disorders, hiatal hernia, migraines, status post knee surgery, risk of a cholecystectomy, vaginal hysterectomy and breast biopsy. She is not a smoker. As noted before she has never had a venous duplex study and an arterial ABI study was attempted but the left lower extremity was noncompressible 10/01/2016 -- had a lower extremity venous duplex reflux evaluation which showed no evidence of deep vein reflux in the right or left lower extremity, and no evidence of great saphenous vein reflux more than 500 ms in the right or left lower extremity, and the left small saphenous vein is incompetent but no vascular consult was recommended. review of her electronic medical records noted that the ABI was checked in July 2017 where the right ABI was normal limits and the left ABI could not be ascertained due to pain with cuff pressure but the waveforms are within normal limits. her arterial duplex study scheduled for April 27. 10/08/2016 -- the patient has various reasons for not having a compression on and for the last 3 days she has had no compression on her  left lower extremity either due to pain or the lack of nursing help. She does not use her juxta lites either. 10/15/2016 -- the patient did not keep her appointment for arterial duplex study on April 27 and I have asked her to reschedule this. Her pain is out of proportion with the physical findings and she continuously fails to wear a compression wraps and cuts them off because she says she cannot tolerate the pain. She does not use her juxta lites either. 10/22/2016 -- he has rescheduled her arterial duplex study to May 21 and her pain today is a bit better. She has not been wearing her juxta lites on her right lower extremity but now understands that she needs to do this. She did tolerate the to press compression wrap on her left lower extremity 10/29/2016 --arterial duplex study is scheduled for next week and overall she has been tolerating her compression wraps and also using her juxta lites on her right lower extremity 11/05/2016 -- the right ABI was 0.95 the left was 1.03. The digit TBI is on the right was 0.83 on the left was 0.92 and she had biphasic flow through these vessels. The impression was that of normal lower extremity arterial study. 11/12/2016 -- her pain is minimal and she is doing very well overall. 11/26/2016 -- she has got juxta lites and her insurance will not pay for additional dual layer compression stockings. She is going to order some from [place]. 05/12/2017 -- her juxta lites are very old and too big for her and these have not been helping with compression. She did get 20-30 mm compression stockings from [place] but she and her husband are unable to put these on. I believe she will benefit from bilateral Extremit-ease, compression stockings and we will measure her for these today. 05/20/2017 --  lymphedema on the left lower extremity has increased a lot and she has a open ulceration as a result of this. The right lower extremity is looking pretty good. She has decided  to by the compression stockings herself and will get reimbursed by the home health, at a later date. 05/27/2017 -- her sciatica is bothering her a lot and she thought her left leg pain was caused due to the compression wrap and hence removed it and has significant lymphedema. There is no inflammation on this left lower extremity. 06/17/17 on evaluation today patient appears to be doing very well and in fact is completely healed in regard to her ulcerations. Unfortunately however she does have continued issues with lymphedema nonetheless. We did order compression garments for her unfortunately she states that the size that she received were large although we ordered medium. Obviously this means she is not getting the optimal compression. She does not have those with her today and therefore we could not confirm and contact the company on her behalf. Nonetheless she does state that she is going to have her husband bring them by tomorrow so that we can verify and then get in touch with the company. No fevers, chills, nausea, or vomiting noted at this time. Overall patient is doing better otherwise and I'm pleased with the progress she has made. 07/01/17 on evaluation today patient appears to be doing very well in regard to her bilateral lower extremity she does not have any openings at this point which is excellent news. Overall I'm pleased with how things have progressed up to this time. Since she is doing so well we did order her compression which we are seeing her today to ensure that it fits her properly and everything is doing well in that regard and then subsequently she will be discharged. ============ Old Notes: 03/31/16 patient presents today for evaluation concerning open wounds that she has over the left medial ankle region as well as the left dorsal foot. She has previously had this occur although it has been healed for a number of months after having this for about a year prior until her  hospitalization on 01/05/16. At that point in time it appears that she was admitted to the hospital for left lower extremity cellulitis and was placed on vancomycin and Zosyn at that point. Eventually upon discharge on January 15, 2016 she was placed on doxycycline at that point in time. Later on 03/27/16 positive wound culture growing Escherichia coli this was switched to amoxicillin. Currently she tells me that she is having pain radiated to be a 7 out of 10 which can be as high as 10 out of 10 with palpation and manipulation of the wound. This wound appears to be mainly venous in nature due to the bilateral lower extremity venous stasis/lymphedema. This is definitely much worse on her left than the right side. She does have type 1 diabetes mellitus, hypertension, morbid obesity, and is wheelchair dependent.during the course of the hospital stay a blood culture was also obtained and fortunately appeared negative. She also had an x-ray of the tibia/fibula on the left which showed no acute bone abnormality. Her white blood cell count which was performed last on 03/25/16 was 7.3, hemoglobin 12.8, protein 7.1, albumin 3.0. Her urine culture appeared to be negative for any specific organisms. Patient did have a left lower extremity venous duplex evaluation for DVT . This did not include venous reflux studies but fortunately was negative for DVT Patient also had arterial  studies performed which revealed that she had a . normal ABI on the right though this was unable to be performed on the left secondary to pain that she was having around the ankle region due to the wound. However it was stated on report that she had biphasic pulses and apparently good blood flow. ========== 06/03/17 she is here in follow-up evaluation for right lower extremity ulcer. The right lower sure he has healed but she has reopened to the left medial malleolus and dorsal foot with weeping. She is waiting for new compression garments  to arrive from home health, the previous compression garments were ill fitting. We will continue with compression bilaterally and follow-up in 2 weeks Readmission: 08/05/17 on evaluation today patient appears to be doing somewhat poorly in regard to her left lower extremity especially although the right lower extremity has a small area which may no longer be open. She has been having a lot of drainage from the left lower extremity however he tells me that she has not been able to use the EXTREMIT-EASE Compression at this point. She states that she did better and was able to actually apply the Juxta-Lite compression although the wound that she has is too large and therefore really does not compress which is why she cannot wear it at this point. She has no one who can help her put it on regular basis her son can sometimes but he's not able to do it most of the time. I do believe that's why she has begun to weave and have issues as she is currently yet again. No fevers, chills, nausea, or vomiting noted at this time. Patient is no evidence of dementia. 08/12/17 on evaluation today patient appears to still be doing fairly well in regard to the draining areas/weeping areas at this point. With that being said she unfortunately did go to the ER yesterday due to what was felt to be possibly a cellulitis. They place her on doxycycline by mouth and discharge her home. She definitely was not admitted. With that being said she states she has had more discomfort which has been unusual for her even compared to prior times and she's had infections.08/12/17 on evaluation today patient appears to still be doing fairly well in regard to the draining areas/weeping areas at this point. With that being said she unfortunately did go to the ER yesterday due to what was felt to be possibly a cellulitis. They place her on doxycycline by mouth and discharge her home. She definitely was not admitted. With that being said she states  she has had more discomfort which has been unusual for her even compared to prior times and she's had infections. 08/19/17 put evaluation today patient tells me that she's been having a lot of what sounds to be neuropathic type pain in regard to her left lower extremity. She has been using over-the-counter topical bins again which some believe. That in order to apply the she actually remove the wrap we put on her last Wednesday on Thursday. Subsequently she has not had anything on compression wise since that time. The good news is a lot of the weeping areas appear to have closed at this point again I believe she would do better with compression but we are struggling to get her to actually use what she needs to at this point. No fevers, chills, nausea, or vomiting noted at this time. 09/03/17 on evaluation today patient appears to be doing okay in regard to her lower extremities  in regard to the lymphedema and weeping. Fortunately she does not seem to show any signs of infection at this point she does have a little bit of weeping occurring in the right medial malleolus area. With that being said this does not appear to be too significant which is good news. 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 She has severe skin damage secondary to both of these features involving the dorsal left foot and medial left ankle and lower leg. Still has open areas in the left anterior foot. The area on the right closed over. She uses her own juxta light stockings. She does not have an arterial issue 09/16/17 on evaluation today patient actually appears to be doing excellent in regard to her bilateral lower extremity swelling. The Juxta-Lite compression wrap seem to be doing very well for her. She has not however been using the portion that goes over her foot. Her left foot still is draining a little bit not nearly as significant as it has been in the  past but still I do believe that she likely needs to utilize the full wrap including the foot portion of this will improve as well. She also has been apparently putting on a significant amount of Vaseline which also think is not helpful for her. I recommended that if she feels she needs something for moisturizer Eucerin will probably be better. 09/30/17 on evaluation today patient presents with several new open areas in regard to her left lower extremity although these appear to be minimal and mainly seem to be more moisture breakdown than anything. Fortunately she does not seem to have any evidence of infection which is great news. She has been tolerating the dressing changes without complication we are using silver alginate on the foot she has been using AB pads to have the legs and using her Juxta- Lite compression which seems to be controlling her swelling very well. Overall I'm pleased with the poor way she has progressed. 10/14/17 on evaluation today patient appears to be doing better in regard to her left lower extremity areas of weeping. She does still have some discomfort although in general this does not appear to be as macerated and I think it is progressing nicely. I do think she still needs to wear the foot portion of her Juxta- Lite in order to get the most benefit from the wrap obviously. She states she understands. Fortunately there does not appear to be evidence of infection at this time which is great news. 10/28/17 on evaluation today patient appears to be doing excellent in regard to her left lower extremity. She has just a couple areas that are still open and seem to be causing any trouble whatsoever. For that reason I think that she is definitely headed in the right direction the spots are very tiny compared to what we have been dealing with in the past. 11/11/17 on evaluation today patient appears to have a right lateral lower extremity ulcer that has opened since I last saw her. She  states this is where the home health nurse that was coming out remove the dressing without wetting the alginate first. Nonetheless I do not know if this is indeed the case or not but more importantly we have not ordered home help to be coming out for her wounds at all. I'm unsure as to why they are coming out and we're gonna have to check on this and get things situated in that  regard. With that being said we currently really do not need them to be coming out as the patient has been taking care of her leg herself without complication and no issues. In fact she was doing much better prior to nursing coming out. 11/25/17 on evaluation today patient actually appears to be doing fairly well in regard to her left lower extremity swelling. In fact she has very little area of weeping at this point there's just a small spot on the lateral portion of her right leg that still has me just a little bit more concerned as far as wanting to see this clear up before I discharge her to caring for this at home. Nonetheless overall she has made excellent progress. 12/09/17 on evaluation today patient appears to be doing rather well in regard to her lower extremity edema. She does have some weeping still in the left lower extremity although the big area we were taking care of two weeks ago actually has closed and she has another area of weeping on the left lower extremity immediately as well is the top of her foot. She does not currently have lymphedema pumps she has been wearing her compression daily on a regular basis as directed. With that being said I think she may benefit from lymphedema pumps. 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 We had a very difficult time getting and keeping this under control. 12/23/17 on evaluation today patient actually appears to be doing  a little bit more poorly in regard to her bilateral lower extremities. She has been tolerating the Juxta-Lite compression wraps. Unfortunately she has two new ulcers on the right lower extremity and left lower Trinity ulceration seems to be larger. Obviously this is not good news. She has been tolerating the dressings without complication. 12/30/17 on evaluation today patient actually appears to be doing much better in regard to her bilateral lower extremity edema. She continues to have some issues with ulcerations and in fact there appears to be one spot on each leg where the wrap may have caused a little bit of a blister which is subsequently opened up at this point is given her pain. Fortunately it does not appear to be any evidence of infection which is good news. No fevers chills noted. 01/13/18 on evaluation today patient appears to be doing rather well in regard to her bilateral lower extremities. The dressings did get kind of stuck as far as the wound beds are concerned but again I think this is mainly due to the fact that she actually seems to be showing signs of healing which is good news. She's not having as much drainage therefore she was having more of the dressing sticking. Nonetheless overall I feel like her swelling is dramatically down compared to previous. 01/20/18 on evaluation today patient unfortunately though she's doing better in most regards has a large blister on the left anterior lower extremity where she is draining quite significantly. Subsequently this is going to need debridement today in order to see what's underneath and ensure she does not continue to trapping fluid at this location. Nonetheless No fevers, chills, nausea, or vomiting noted at this time. 01/27/18 on evaluation today patient appears to be doing rather well at this point in regard to her right lower extremity there's just a very small area that she still has open  at this point. With that being said I do believe  that she is tolerating the compression wraps very well in making good progress. Home health is coming out at this point to see her. Her left lower extremity on the lateral portion is actually what still mainly open and causing her some discomfort for the most part 02/10/18 on evaluation today patient actually appears to be doing very well in regard to her right lower extremity were all the ulcers appear to be completely close. In regard to the left lower extremity she does have two areas still open and some leaking from the dorsal surface of her foot but this still seems to be doing much better to me in general. 02/24/18 on evaluation today patient actually appears to be doing much better in regard to her right lower extremity this is still completely healed. Her left lower extremity is also doing much better fortunately she has no evidence of infection. The one area that is gonna require some debridement is still on the left anterior shin. Fortunately this is not hurting her as badly today. 03/10/18 on evaluation today patient appears to be doing better in some regards although she has a little bit more open area on the dorsal foot and she also has some issues on the medial portion of the left lower extremity which is actually new and somewhat deep. With that being said there fortunately does not appear to be any significant signs of infection which is good news. No fevers, chills, nausea, or vomiting noted at this time. In general her swelling seems to be doing fairly well which is good news. 03/31/18 on evaluation today patient presents for follow-up concerning her left lower extremity lymphedema. Unfortunately she has been doing a little bit more poorly since I last saw her in regard to the amount of weeping that she is experiencing. She's also having some increased pain in the anterior shin location. Unfortunately I do not feel like the patient is making such good progress at this point a few weeks  back she was definitely doing much better. 04/07/18 on evaluation today patient actually appears to be showing some signs of improvement as far as the left lower extremity is concerned. She has been tolerating the dressing changes and it does appear that the Drawtex did better for her. With that being said unfortunately home health is stating that they cannot obtain the Drawtex going forward. Nonetheless we're gonna have to check and see what they may be able to get the alginate they were using was getting stuck in causing new areas of skin being pulled all that with and subsequently weep and calls her to worsen overall this is the first time we've seen improvement at this time. 04/14/18 on evaluation today patient actually appears to be doing rather well at this point there does not appear to be any evidence of infection at this time and she is actually doing excellent in regard to the weeping in fact she almost has no openings remaining even compared to just last week this is a dramatic improvement. No fevers chills noted 04/21/18 evaluation today patient actually appears to be doing very well. She in fact is has a small area on the posterior lower extremity location and she has a small area on the dorsal surface of her foot that are still open both of which are very close to closing. We're hoping this will be close shortly. She brought her Juxta-Lite wrap with her today hoping that would  be able to put her in it unfortunately I don't think were quite at that point yet but we're getting closer. 04/28/18 upon evaluation today patient actually appears to be doing excellent in regard to her left lower extremity ulcer. In fact the region on the posterior lower extremity actually is much smaller than previously noted. Overall I'm very happy with the progress she has made. She again did bring her Juxta-Lite although we're not quite ready for that yet. 05/11/18 upon evaluation today patient actually appears  to be doing in general fairly well in regard to her left lower Trinity. The swelling is very well controlled. With that being said she has a new area on the left anterior lower extremity as well as between the first and second toes of her left foot that was not present during the last evaluation. The region of her posterior left lower extremity actually appears to be almost completely healed. T be honest I'm very pleased with o the way that stands. Nonetheless I do believe that the lotion may be keeping the area to moist as far as her legs are concerned subsequently I'm gonna consider discontinuing that today. 05/26/18 on evaluation today patient appears to be doing rather well in regard to her left lower should be ulcers. In fact everything appears to be close except for a very small area on the left posterior lower extremity. Fortunately there does not appear to be any evidence of infection at this time. Overall very pleased with her progress. 06/02/18 and evaluation today patient actually appears to be doing very well in regard to her lower extremity ulcers. She has one small area that still continues to weep that I think may benefit her being able to justify lotion and user Juxta-Lite wraps versus continued to wrap her. Nonetheless I think this is something we can definitely look into at this point. 06/23/18 on evaluation today patient unfortunately has openings of her bilateral lower extremities. In general she seems to be doing much worse than when I last saw her just as far as her overall health standpoint is concerned. She states that her discomfort is mainly due to neuropathy she's not having any other issues otherwise. No fevers, chills, nausea, or vomiting noted at this time. 06/30/18 on evaluation today patient actually appears to be doing a little worse in regard to her right lower extremity her left lower extremity of doing fairly well. Fortunately there is no sign of infection at this  time. She has been tolerating the dressing changes without complication. Home health did not come out like they were supposed to for the appropriate wrap changes. They stated that they never received the orders from [place] which were fax. Nonetheless we will send a copy of the orders with the patient today as well. 07/07/18 on evaluation today patient appears to be doing much better in regard to lower extremities. She still has several openings bilaterally although since I last saw her her legs did show obvious signs of infection when she later saw her nurse. Subsequently a culture was obtained and she is been placed on Bactrim and Keflex. Fortunately things seem to be looking much better it does appear she likely had an infection. Again last week we'd even discussed it but again there really was not any obvious sign that she had infection therefore we held off on the antibiotics. Nonetheless I'm glad she's doing better today. 07/14/18 on evaluation today patient appears to be doing much better regarding her bilateral lower Trinity's.  In fact on the right lower for me there's nothing open at this point there are some dry skin areas at the sites where she had infection. Fortunately there is no evidence of systemic infection which is excellent news. No fevers chills noted 07/21/18 on evaluation today patient actually appears to be doing much better in regard to her left lower extremity ulcers. She is making good progress and overall I feel like she's improving each time I see her. She's having no pain I do feel like the infection is completely resolved which is excellent news. No fevers, chills, nausea, or vomiting noted at this time. 07/28/18 on evaluation today patient appears to be doing very well in regard to her left lower [employer]. Everything seems to be showing signs of improvement which is excellent news. Overall very pleased with the progress that has been made. Fortunately there's no evidence of  active infection at this time also excellent news. 08/04/18 on evaluation today patient appears to be doing more poorly in regard to her bilateral lower extremities. She has two new areas open up on the right and these were completely closed as of last week. She still has the two spots on the left which in my pinion seem to be doing better. Fortunately there's no evidence of infection again at this point. 08/11/18 on evaluation today patient actually appears to be doing very well in regard to her bilateral lower Trinity wounds that all seem to be doing better and are measures smaller today. Fortunately there's no signs of infection. No fevers, chills, nausea, or vomiting noted at this time. 08/18/18 on evaluation today patient actually appears to be doing about the same inverter bilateral lower extremities. She continues to have areas that blistering open as was drain that fortunately nothing too significant. Overall I feel like Drawtex may have done better for her however compared to the collagen. 08/25/18 on evaluation today patient appears to be doing a little bit more poorly today even compared to last time I saw her. Again I'm not exactly sure why she's making worse progress over the past several weeks. I'm beginning to wonder if there is some kind of underlying low level infection causing this issue. I did actually take a culture from the left anterior lower extremity but it was a new wound draining quite a bit at this point. Unfortunately she also seems to be having more pain which is what also makes me worried about the possibility of infection. This is despite never erythema noted at this point. 09/01/18 on evaluation today patient actually appears to be doing a little worse even compared to last week in regard to bilateral lower extremities. She did go to the hospital on the 16th was given a dose of IV Zosyn and then discharged with a recommendation to continue with the Bactrim that I previously  prescribed for her. Nonetheless she is still having a lot of discomfort she tells me as well at this time. This is definitely unfortunate. No fevers, chills, nausea, or vomiting noted at this time. 09/08/18 on evaluation today patient's bilateral lower extremities actually appear to be shown signs of improvement which is good news. Fortunately there does not appear to be any signs of active infection I think the anabiotic is helping in this regard. Overall I'm very pleased with how she is progressing. 09/15/18 patient was actually seen in ER yesterday due to her legs as well unfortunately. She states she's been having a lot of pain and discomfort as  well as a lot of drainage. Upon inspection today the patient does have a lot of swelling and drainage I feel like this is more related to lymphedema and poor fluid control than it is to infection based on what I'm seeing. The physician in the emergency department also doubted that the patient was having a significant infection nonetheless I see no evidence of infection obvious at this point although I do see evidence of poor fluid control. She still not using a compression pumps, she is not elevating due to her lift chair as well as her hospital bed being broken, and she really is not keeping her legs up as much as they should be and also has been taking off her wraps. All this combined I think has led to poor fluid control and to be honest she may be somewhat volume overloaded in general as well. I recommend that she may need to contact your physician to see if a prescription for a diuretic would be beneficial in their opinion. As long as this is safe I think it would likely help her. 09/29/18 on evaluation today patient's left lower extremity actually appears to be doing quite a bit better. At least compared to last time that I saw her. She still has a large area where she is draining from but there's a lot of new skin speckled trout and in fact there's more  new skin that there are open areas of weeping and drainage at this point. This is good news. With regard to the right lower extremity this is doing much better with the only open area that I really see being a dry spot on the right lateral ankle currently. Fortunately there's no signs of active infection at this time which is good news. No fevers, chills, nausea, or vomiting noted at this time. The patient seems somewhat stressed and overwhelmed during the visit today she was very lethargic as such. She does and she is not taking any pain medications at this point. Apparently according to her husband are also in the process of moving which is probably taking its toll on her as well. 10/06/18 on evaluation today patient appears to be doing rather well in regard to her lower extremities compared to last evaluation. Fortunately there's no signs of active infection. She tells me she did have an appointment with her primary. Nonetheless he was concerned that the wounds were somewhat deep based on pictures but we never actually saw her legs. She states that he had her somewhat worried due to the fact that she was fearing now that she was [place] have to have an amputation. With that being said based on what I'm seeing check she looks better this week that she has the last two times I've seen her with much less drainage I'm actually pleased in this regard. That doesn't mean that she's out of the water but again I do not think what the point of talking about education at all in regard to her leg. She is very happy to hear this. She is also not having as much pain as she was having last week. 10/13/18 unfortunately on evaluation today patient still continues to have a significant amount of drainage she's not letting home health actually apply the compression dressings at this point. She's trying to use of Juxta-Lite of the top of Kerlex and the second layer of the three layer compression wrap. With that being said  she just does not seem to be making as good a  progress as I would expect if she was having the compression applied and in place on a regular basis. No fevers, chills, nausea, or vomiting noted at this time. 10/20/18 on evaluation today patient appears to be doing a little better in regard to her bilateral lower extremity ulcers. In fact the right lower extremity seems to be healed she doesn't even have any openings at this point left lower extremity though still somewhat macerated seems to be showing signs of new skin growth at multiple locations throughout. Fortunately there's no evidence of active infection at this time. No fevers, chills, nausea, or vomiting noted at this time. 10/27/18 on evaluation today patient appears to be doing much better in regard to her left lower Trinity ulcer. She's been tolerating the laptop complication and has minimal drainage noted at this point. Fortunately there's no signs of active infection at this time. No fevers, chills, nausea, or vomiting noted at this time. 11/03/18 on evaluation today patient actually appears to be doing excellent in regard to her left lower extremity. She is having very little drainage at this point there does not appear to be any significant signs of infection overall very pleased with how things have gone. She is likewise extremely pleased still and seems to be making wonderful progress week to week. I do believe antibiotics were helpful for her. Her primary care provider did place on amateur clean since I last saw her. 11/17/18 on evaluation today patient appears to be doing worse in regard to her bilateral lower extremities at this point. She is been tolerating the dressing changes without complication. With that being said she typically takes the Coban off fairly quickly upon arriving home even after being seen here in the clinic and does not allow home health reapply command as part of the dressing at home. Therefore she said no compression  essentially since I last saw her as best I can tell. With that being said I think it shows and how much swelling she has in the open wounds that are noted at this point. Fortunately there's no signs of infection but unfortunately if she doesn't get this under control I think she will end up with infection and more significant issues. 11/24/18 on evaluation today patient actually appears to be doing somewhat better in regard to her bilateral lower extremities. 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 Nonetheless that is doing better currently so she should be able to attempt reinitiating the lymphedema pumps at this time. No fevers, chills, nausea, or vomiting noted at this time. 12/01/18 upon evaluation today patient's left lower extremity appears to be doing quite well unfortunately her right lower extremity is not doing nearly as well. She has been tolerating the dressing changes without complication unfortunately she did not keep a wrap on the right at this time. Nonetheless I believe this has led to increased swelling and weeping in the world is actually much larger than during the last evaluation with her. 12/08/18 on evaluation today patient appears to be doing about the same at this point in regard to her right lower extremity. There is some more palatable to touch I'm concerned about the possibility of there being some infection although I think the main issue is she's not keeping her compression wrap on which in turn is not allowing this  area to heal appropriately. 12/22/18 on evaluation today patient appears to be doing better in regard to left lower extremity unfortunately significantly worse in regard to the right lower extremity. The areas of blistering and necrotic superficial tissue have spread and again this does not really appear to be signs of  infection and all she just doesn't seem to be doing nearly as well is what she has been in the past. Overall I feel like the Augmentin did absolutely nothing for her she doesn't seem to have any infection again I really didn't think so last time either is more of a potential preventative measure and hoping that this would make some difference but I think the main issue is she's not wearing her compression. She tells me she cannot wear the [place] been we put on she takes it off pretty much upon getting home. Subsequently she worshiped Juxta-Lite when I questioned her about how often she wears it this is no more than three hours a day obviously that leaves 21 hours that she has no compression and this is obviously not doing well for her. Overall I'm concerned that if things continue to worsen she is at great risk of both infection as well as losing her leg. 01/05/19 on evaluation today patient appears to be doing well in regard to her left lower extremity which he is allowing [place] to wrap and not so well with regard to her right lower extremity which she is not allowing [place] to really wrap and keep the wrap on. She states that it hurts too badly whenever it's wrapped and she ends up having to take it off. She's been using the Juxta-Lite she tells me up to six hours a day although I question whether or not that's really been the case to be honest. Previously she told me three hours today nonetheless obviously the legs as long as the wrap is doing great when she is not is doing much more poorly. 01/12/2019 on evaluation today patient actually appears to be doing a little better in my opinion with regard to her right lower extremity ulcer. She has a small open area on the left lower extremity unfortunately but again this I think is part of the normal fluctuation of what she is going to have to expect with regard to her legs especially when she is not using her lymphedema pumps on a regular basis. Subsequently  based on what I am seeing today I think that she does seem to be doing slightly better with regard to her right lower extremity she did see her primary care provider on Monday they felt she had an infection and placed her on 2 antibiotics. Both Cipro and clindamycin. Subsequently again she seems possibly to be doing a little bit better in regards to the right lower extremity she also tells me however she has been wearing the compression wrap over the past week since I spoke with her as well that is a Kerlix and Coban wrap on the right. No fevers, chills, nausea, vomiting, or diarrhea. 01/19/2019 on evaluation today patient appears to be doing better with regard to her bilateral lower extremities especially the right. I feel like the compression has been beneficial for her which is great news. She did get a call from her primary care provider on her way here today telling her that she did have methicillin- resistant Staphylococcus aureus and he was calling in a couple new antibiotics for her including a ointment to be applied she tells  me 3 times a day. With that being said this sounds like likely to be Bactroban which I think could be applied with each dressing/wrap change but I would not be able to accommodate her applying this 3 times a day. She is in agreement with the least doing this we will add that to her orders today. 01/26/2019 on evaluation today patient actually appears to be doing much better with regard to her right lower extremity. Her left lower extremity is also doing quite well all things considering. Fortunately there is no evidence of active infection at this time. No fevers, chills, nausea, vomiting, or diarrhea. 02/02/2019 on evaluation today patient appears to be doing much better compared to her last evaluation. Little by little off like her right leg is returning more towards normal. There does not appear to be any signs of active infection and overall she seems to be doing quite  well which is great news. I am very pleased in this regard. No fevers, chills, nausea, vomiting, or diarrhea. 02/09/2019 upon evaluation today patient appears to be doing better with regard to her bilateral lower extremities. She has been tolerating the dressing changes without complication. Fortunately there is no signs of active infection at this time. No fevers, chills, nausea, vomiting, or diarrhea. 02/23/2019 on evaluation today patient actually appears to be doing quite well with regard to her bilateral lower extremities. She has been tolerating the dressing changes without complication. She is even used her pumps one time and states that she really felt like it felt good. With that being said she seems to be in good spirits and her legs appear to be doing excellent. 03/09/2019 on evaluation today patient appears to be doing well with regard to her right lower extremity there are no open wounds at this time she is having some discomfort but I feel like this is more neuropathy than anything. With regard to her left lower extremity she had several areas scattered around that she does have some weeping and drainage from but again overall she does not appear to be having any significant issues and no evidence of infection at this time which is good news. 03/23/2019 on evaluation today patient appears to be doing well with regard to her right lower extremity which she tells me is still close she is using her juxta light here. Her left lower extremity she mainly just has an area on the foot which is still slightly draining although this also is doing great. Overall very pleased at this time. 04/06/2019 patient appears to be doing a little bit worse in regard to her left lower extremity upon evaluation today. She feels like this could be becoming infected again which she had issues with previous. Fortunately there is no signs of systemic infection but again this is always a struggle with her with her  legs she will go from doing well to not so well in a very short amount of time. 04/20/2019 on evaluation today patient actually appears to be doing quite well with regard to her right lower extremity I do not see any signs of active infection at this time. Fortunately there is no fever chills noted. She is still taking the antibiotics which I prescribed for her at this point. In regard to the left lower extremity I do feel like some of these areas are better although again she still is having weeping from several locations at this time. 04/27/2019 on evaluation today patient appears to be doing about the same if  not slightly worse in regard to her left lower extremity ulcers. She tells me when questioned that she has been sleeping in her Hoveround chair in fact she tells me she falls asleep without even knowing it. I think she is spending a whole lot of time in the chair and less time walking and moving around which is not good for her legs either. On top of that she is in a seated position which is also the worst position she is not really elevating her legs and she is also not using her lymphedema pumps. All this is good to contribute to worsening of her condition in general. 05/18/2019 on evaluation today patient appears to be doing well with regard to her lower extremity on the right in fact this is showing no signs of any open wounds at this time. On the left she is continuing to have issues with areas that do drain. Some of the regions have healed and there are couple areas that have reopened. She did go to the ER per the patient according to recommendations from the home health nurse due to what she was seen when she came out on 05/13/2019. Subsequently she felt like the patient needed to go to the hospital due to the fact that again she was having "milky white discharge" from her leg. Nonetheless she had and then was placed on doxycycline and subsequently seems to be doing better. 06/01/2019  upon evaluation today patient appears to be doing really in my opinion about the same. I do not see any signs of active infection which is good news. Overall she still has wounds over the bilateral lower extremities she has reopened on the right but this appears to be more of a crack where there is weeping/edema coming from the region. I do not see any evidence of infection at either site based on what I visualized today. 07/13/2019 upon evaluation today patient appears to be doing a little worse compared to last time I saw her. She since has been in the hospital from 06/21/2019 through 06/29/2019. This was secondary to having Covid. During that time they did apply lotion to her legs which unfortunately has caused her to develop a myriad of open wounds on her lower extremities. Her legs do appear to be doing better as far as the overall appearance is concerned but nonetheless she does have more open and weeping areas. 07/27/2019 upon evaluation today patient appears to be doing more poorly to be honest in regard to her left lower extremity in particular. There is no signs of systemic infection although I do believe she may have local infection. She notes she has been having a lot of blue/green drainage which is consistent potentially with Pseudomonas. That may be something that we need to consider here as well. The doxycycline does not seem to have been helping. 08/03/2019 upon evaluation today patient appears to be doing a little better in my opinion compared to last week's evaluation. Her culture I did review today and she is on appropriate medications to help treat the Enterobacter that was noted. Overall I feel like that is good news. With that being said she is unfortunately continuing to have a lot of drainage and though it is doing better I still think she has a long ways to go to get things dried up in general. Fortunately there is no signs of systemic infection. 08/10/2019 upon evaluation today  patient appears to be doing may be slightly better in regard to her left  lower extremity the right lower extremity is doing much better. Fortunately there is no signs of infection right now which is good news. No fevers, chills, nausea, vomiting, or diarrhea. 08/24/2019 on evaluation today patient appears to be doing slightly better in regard to her lower extremities. The left lower extremity seems to be healed the right lower extremity is doing better though not completely healed as far as the openings are concerned. She has some generalized issues here with edema and weeping secondary to her lymphedema though again I do believe this is little bit drier compared to prior weeks evaluations. In general I am very pleased with how things seem to be progressing. No fevers, chills, nausea, vomiting, or diarrhea. 08/31/2019 upon evaluation today patient actually seems to making some progress here with regard to the left lower extremity in particular. She has been tolerating the dressing changes without complication. Fortunately there is no signs of active infection at this time. No fevers, chills, nausea, vomiting, or diarrhea. She did see Dr. Doren Custard and he did note that she did have a issue with the left great saphenous vein and the small saphenous vein in the leg. With that being said he was concerned about the possibility of laser ablation not being extremely successful. He also mentioned a small risk of DVT associated with the procedure. However if the wounds do not continue to improve he stated that that would probably be the way to go. Fortunately the patient's legs do seem to be doing much better. 09/07/2019 upon evaluation today patient appears to be doing better with regard to her lower extremities. She has been tolerating the dressing changes without complication. With that being said she is showing signs of improvement and overall very pleased. There are some areas on her leg that I think we do need  to debride we discussed this last week the patient is in agreement with doing that as long as it does not hurt too badly. 09/14/2019 upon evaluation today patient appears to be doing decently well with regard to her left lower extremity. She is not having near as much weeping as she has had in the past things seem to be drying up which is good news. There is no signs of active infection at this time. 09/21/19 upon evaluation today patient appears to be doing better in regard overall to her bilateral lower extremities. She again has less open than she did previous and each week I feel like this is getting better. Fortunately there is no signs of active infection at this time. No fevers, chills, nausea, vomiting, or diarrhea. 09/28/2019 upon evaluation today patient actually appears to be showing signs of improvement with regard to her left lower extremity. Unfortunately the right medial lower extremity around the ankle region has reopened to some degree but this appears to be minimal still which is good news. There is no signs of active infection at this time which is also good news. 10/12/2019 upon evaluation today patient appears to be doing okay with regard to her bilateral lower extremities today. The right is a little bit worse then last evaluation 2 weeks ago. The left is actually doing a little better in my opinion. Overall there is no signs of active infection at this time that I see. Obviously that something we have to keep a close eye on she is very prone to this with the significant and multiple openings that she has over the bilateral lower extremities. 10/19/2019 upon evaluation today patient appears to be  doing about the best that I have seen her in quite some time. She has been tolerating the dressing changes without complication. There does not appear to be any signs of active infection and overall I am extremely happy with the way her legs appeared. She is drying up quite nicely and overall  is having less pain. 11/09/2019 upon evaluation today patient appears to be doing better in regard to her wounds. She seems to be drying up more and more each time I see her this is just taking a very long time. Fortunately there is no signs of active infection at this time. 11/23/2019 upon evaluation today patient actually appears to be doing excellent in regard to her lower extremities at this point compared to where she has been. Fortunately there is no signs of active infection at this time. She did go to the hospital last week for nausea and vomiting completely unrelated to her wounds. Fortunately she is doing better she was given some Reglan and got better. She had associated abdominal pain but they never found out what was going on. 12/07/2019 upon evaluation today patient appears to be doing well for the most part in regard to her legs. She unfortunately has not been keeping the [place] portion of her wraps on therefore the compression has not really been sufficient for what it is supposed to be. Nonetheless she tells me that it just hurt too bad therefore she removed it. 12/21/2019 upon evaluation today patient actually appears to be doing quite well with regard to her legs. I do feel like she has been making progress which is great news and overall there is no signs of active infection at this time. No fevers, chills, nausea, vomiting, or diarrhea. 01/04/2020 upon evaluation today patient presents for follow-up concerning her lower extremity edema bilaterally. She still has open wounds she has not been using her lymphedema pumps. She is also not been utilizing her compression wraps appropriately she tends to unwrap them, take them off, or states that they hurt. Obviously the reason they hurt is because her legs start to swell but the issue is if she would use her compression/lymphedema pumps regularly she would not swell and she would have the pain. Nonetheless she has not even picked them up once  honestly over the past several months and may be even as much as in the past year based on my opinion and what have seen. She tells me today that after last week when I talked about this with her specifically actually that was 2 weeks ago that she "forgot". 8//21 on evaluation today patient appears to be doing a little better in regard to her legs bilaterally. Fortunately there is no signs of active infection at this time. She tells me that she used her lymphedema pumps all of one time over the past 2 weeks since I last saw her. She tells me that she has been too busy in order to continue to use these. 02/01/2020 on evaluation today patient appears to be doing some better in regard to her wounds in general in her legs. We felt the right was healed although is not completely it does appear to be doing better she tells me she has been using her lymphedema pumps that she has had this six times since I last saw her. Obviously the more she does that the better she would do my opinion 02/15/2020 upon evaluation today patient appears to be doing about the same in regard to her legs. She  tells me that she is pumping I'm still not sure how much she does to be perfectly honest. However even if she does a little bit here and there I guess that is better than nothing. Fortunately there is no sign of active infection at this time which is great news. No fevers, chills, nausea, vomiting, or diarrhea. 02/29/2020 on evaluation today patient actually appears to be doing quite well all things considered this week. She has been tolerating the dressing changes without complication. Fortunately there is no signs of active infection at this time. No fevers, chills, nausea, vomiting, or diarrhea. 03/14/2020 upon evaluation today patient appears to be doing really about the same in regard to her legs. There is no signs of improvement overall and she as noted from home health does not appear to be elevating her legs he can get  into her lift chair. There is too much stuff piled up on it the patient tells me. She also tells me she cannot really use her pumps effectively due to the fact that she cannot have any space to get them on. Finally she is also not really elevating her legs because she is not sleeping in her bed she is sleeping in her chair currently and again overall I think everything that she is done in combination has been exactly the wrong thing for what she needs for her legs. 04/04/2020 upon evaluation today patient appears to be doing well at this time with regard to her legs. She is actually been pumping, keeping her wraps on, and to be honest she seems to be doing dramatically better the right leg is excellent the left leg is also excellent and measuring much smaller than previous. 04/18/2020 upon evaluation today patient actually is continue to make good progress in regard to her lower extremities bilaterally. Everything is improving and less wet that has been in the past overall I am extremely pleased with where things stand and I think that she is making great progress. The patient tells me she still continue to use her compression pumps 05/02/2020 on evaluation today patient appears to be doing well at this time in regard to her left leg which is showing signs of drying up. With that being said she does have a lot of lymphedema type crusty skin around the toes of her left foot and the right medial ankle which has opened at this point. Fortunately there is no signs of active infection systemically at this point or even locally for that matter. 05/23/2020 on evaluation today patient appears to be doing well with regard to her lower extremities. Fortunately there is no signs of active infection at this time. No fever chills noted. She has been very depressed however she tells me. 06/06/2020 patient came in today for evaluation in regard to her bilateral lower extremity ulcerations. With that being said she came  in feeling okay and actually laughing and joking around with the staff checking her in. Subsequently however she had a coughing spell and following the coughing spell it was a dramatic conversion from being jovial and joking around to being extremely short of breath her vital signs actually dropped in regard to her blood pressure from around 175 to down around 371 for systolic and from around 96 diastolic down to around 70. With that being said she also accompanied this with an increase in her respiratory rate which was also quite significant. Nonetheless I actually upon going into see her was extremely worried we called EMS to have  her transported to the ER for further evaluation and treatment. She continued until EMS got here to be extremely short of breath even on 6 L of oxygen. Her oxygen saturation did come up to 99% but overall it was only 95 before which was not terrible she did feel like the oxygen helped her feel somewhat better however. She has a history of asthma but again even the coughing spell really should not have done this degree of alteration in her demeanor from where she was just before to after the coughing spell. She tells [place] however she has been feeling somewhat abnormal since Friday although she could not really pinpoint exactly what was going on. 06/27/2020 plan evaluation today patient appears to be doing okay in regard to her leg ulcers. She is really not showing a lot of improvement to be honest and she still has a lot of weeping. With that being said after I last saw her we called EMS to take her to the hospital she actually did not go she went to see her primary care provider who according to the patient have not identified and read through the note states that everything checked out okay. Nonetheless the patient has continued to have bouts where she gets very short of breath for seemingly no reason whatsoever. That happened even the same night that I saw her last time.  This was after she refused transport to the hospital. With that being said she also tells me that just getting dressed to come here is quite a chore which is putting on her shirt causing her to have to stop to catch her breath. Obviously this is not normal and I feel like there is something going on. She may need a referral to be seen by cardiology ASAP. She does see her primary care provider early next week she tells me 07/11/2020 upon evaluation today patient's wounds again appear to be doing about the same she mainly has weeping of the bilateral lower extremities both are very swollen today she tells me she has been using her pumps but last time I saw her she told me she did not even have her pumps out that she had "packed them away because they had too much stuff in their house. Apparently her husband has been bringing stuff in from storage units. She then subsequently told me that she had so much stuff in her house that she has been staying up all night and did not go to bed till 7:00 this morning because she was cleaning up stuff. Nonetheless I am still unsure as to whether or not she is using the pumps based on what I am seeing I would think probably not. Nonetheless she has been keeping the compression wraps in place that is at least something 07/25/2020 upon evaluation today patient appears to be doing well currently in regard to her legs all things considered. I do not think she is doing any worse significantly although honestly I do not think she is doing a lot better either. There does not appear to be any signs of infection which is good news although she does have a lot of weeping. She is using her pumps she tells me sporadically though she says that her husband is not a big fan of helping her to get the [place]. She also tells me is difficult because in their house. It would be easiest for her to use these as where her son is actually staying with her and living out at  this point. Obviously I  understand that she is having a lot of issues here and to be honest I do not really know what to say in that regard and the fact that I really cannot change the situations but I do believe she really needs to be using the lymphedema pumps to try to keep things under control here. Outside of that I think she is going to continue to have significant issues. I think the pumps coupled with good compression and elevation are the only things that are to help her at this point. 08/08/2020 upon evaluation today patient appears to be doing a little worse in regard to her legs in general. I think this may be due to some infection currently. With that being said I am going to go ahead and likely see about putting her on an antibiotic. Were also can obtain a culture today where she had some purulent drainage. 08/29/2020 upon evaluation today patient appears to be doing about the same in regard to her bilateral lower extremities. Unfortunately she is continuing to have significant issues here with edema and she has significant lymphedema. With that being said she is really not doing anything that she is supposed to be doing as far as elevation, compression, using lymphedema pumps or anything really for that matter. She does not use her pumps, is taken the wraps off shortly after having them put on as far as home health is concerned at least by the next day she tells me, and overall is not really elevating her legs she is telling me she has a lot to do as far as cleaning up in her house. Nonetheless this triad of noncompliance is leading to worsening not improving in regard to her legs. I had a very strong conversation with her about this today again. Objective Constitutional Obese and well-hydrated in no acute distress. Vitals Time Taken: 10:54 AM, Height: 62 in, Weight: 335 lbs, BMI: 61.3, Temperature: 99.0 F, Pulse: 96 bpm, Respiratory Rate: 18 breaths/min, Blood Pressure: 145/81 mmHg, Capillary Blood Glucose:  245 mg/dl. General Notes: glucose per pt report Respiratory normal breathing without difficulty. Psychiatric this patient is able to make decisions and demonstrates good insight into disease process. Alert and Oriented x 3. pleasant and cooperative. General Notes: Patient's wound bed actually showed signs of really having quite a bit of drainage over the bilateral lower extremities with open wounds which are severely tender to touch unfortunately. With that being said I am not really certain what she is really wanting from me I am not able to control everything that she is doing on compliantly at home and without focusing on these things herself she is not can get better. Integumentary (Hair, Skin) Wound #61 status is Open. Original cause of wound was Gradually Appeared. The date acquired was: 04/20/2019. The wound has been in treatment 71 weeks. The wound is located on the Left,Circumferential Lower Leg. The wound measures 8cm length x 24cm width x 0.1cm depth; 150.796cm^2 area and 15.08cm^3 volume. There is Fat Layer (Subcutaneous Tissue) exposed. There is no tunneling or undermining noted. There is a medium amount of serous drainage noted. The wound margin is indistinct and nonvisible. There is large (67-100%) pink, pale granulation within the wound bed. There is no necrotic tissue within the wound bed. Wound #64 status is Open. Original cause of wound was Gradually Appeared. The date acquired was: 06/01/2019. The wound has been in treatment 65 weeks. The wound is located on the Left,Dorsal Foot. The wound  measures 6cm length x 6.2cm width x 0.1cm depth; 29.217cm^2 area and 2.922cm^3 volume. There is Fat Layer (Subcutaneous Tissue) exposed. There is no tunneling or undermining noted. There is a medium amount of serous drainage noted. The wound margin is flat and intact. There is large (67-100%) pink, pale granulation within the wound bed. There is no necrotic tissue within the wound bed. Wound  #67 status is Open. Original cause of wound was Gradually Appeared. The date acquired was: 05/02/2020. The wound has been in treatment 17 weeks. The wound is located on the Right,Medial Lower Leg. The wound measures 3.5cm length x 3.6cm width x 0.1cm depth; 9.896cm^2 area and 0.99cm^3 volume. There is Fat Layer (Subcutaneous Tissue) exposed. There is no tunneling or undermining noted. There is a medium amount of serous drainage noted. The wound margin is thickened. There is large (67-100%) pink, pale granulation within the wound bed. There is no necrotic tissue within the wound bed. Wound #69 status is Open. Original cause of wound was Gradually Appeared. The date acquired was: 07/25/2020. The wound has been in treatment 5 weeks. The wound is located on the Right,Dorsal Foot. The wound measures 0.7cm length x 0.7cm width x 0.1cm depth; 0.385cm^2 area and 0.038cm^3 volume. There is Fat Layer (Subcutaneous Tissue) exposed. There is no tunneling or undermining noted. There is a medium amount of serosanguineous drainage noted. The wound margin is distinct with the outline attached to the wound base. There is large (67-100%) pink, pale granulation within the wound bed. There is no necrotic tissue within the wound bed. Wound #70 status is Open. Original cause of wound was Gradually Appeared. The date acquired was: 08/29/2020. The wound is located on the Right,Anterior Lower Leg. The wound measures 3cm length x 0.5cm width x 0.1cm depth; 1.178cm^2 area and 0.118cm^3 volume. There is Fat Layer (Subcutaneous Tissue) exposed. There is no tunneling or undermining noted. There is a medium amount of serosanguineous drainage noted. The wound margin is flat and intact. There is large (67-100%) pink granulation within the wound bed. There is no necrotic tissue within the wound bed. 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 There was a Three Layer Compression Therapy Procedure by Rhae Hammock, RN. Post procedure Diagnosis Wound #61: Same as Pre-Procedure Wound #67 Pre-procedure diagnosis of Wound #67 is a Diabetic Wound/Ulcer of the Lower Extremity located on the Right,Medial Lower Leg . There was a Three Layer Compression Therapy Procedure by Rhae Hammock, RN. Post procedure Diagnosis Wound #67: Same as Pre-Procedure Plan Follow-up Appointments: Return Appointment in 2 weeks. Bathing/ Shower/ Hygiene: May shower and wash wound with soap and water. - with dressing changes, wash both legs with wash cloth and soap and water with dressing changes Edema Control - Lymphedema / SCD / Other: Lymphedema Pumps. Use Lymphedema pumps on leg(s) 2-3 times a day for 45-60 minutes. If wearing any  wraps or hose, do not remove them. Continue exercising as instructed. Elevate legs to the level of the heart or above for 30 minutes daily and/or when sitting, a frequency of: Avoid standing for long periods of time. Exercise regularly Home Health: No change in wound care orders this week; continue Home Health for wound care. May utilize formulary equivalent dressing for wound treatment orders unless otherwise specified. Other Home Health Orders/Instructions: -  [facility] WOUND #61: - Lower Leg Wound Laterality: Left, Circumferential Peri-Wound Care: Zinc Oxide Ointment 30g tube 2 x Per Week/30 Days Discharge Instructions: Apply Zinc Oxiide to weeping areas with each dressing change Peri-Wound Care: Sween Lotion (Moisturizing lotion) (Home Health) 2 x Per Week/30 Days Discharge Instructions: Apply moisturizing lotion as directed Secondary Dressing: Zetuvit Plus 4x4 in 2 x Per Week/30 Days Discharge Instructions: or equivalent extra absorbent pad.Apply over primary dressing as directed. Com pression Wrap: ThreePress (3 layer compression wrap) (Home Health) 2 x Per Week/30 Days Discharge Instructions: Apply three layer compression as directed. Pad bend of ankle with foam or ABD pad. WOUND #64: - Foot Wound Laterality: Dorsal, Left Peri-Wound Care: Zinc Oxide Ointment 30g tube 2 x Per Week/30 Days Discharge Instructions: Apply Zinc Oxiide to weeping areas with each dressing change Peri-Wound Care: Sween Lotion (Moisturizing lotion) (Home Health) 2 x Per Week/30 Days Discharge Instructions: Apply moisturizing lotion as directed Secondary Dressing: Zetuvit Plus 4x4 in 2 x Per Week/30 Days Discharge Instructions: or equivalent extra absorbent pad.Apply over primary dressing as directed. Com pression Wrap: ThreePress (3 layer compression wrap) (Home Health) 2 x Per Week/30 Days Discharge Instructions: Apply three layer compression as directed. Pad bend of ankle with foam or ABD pad. WOUND #67: - Lower Leg Wound Laterality: Right, Medial Peri-Wound Care: Zinc Oxide Ointment 30g tube 2 x Per Week/30 Days Discharge Instructions: Apply Zinc Oxiide to weeping areas with each dressing change Peri-Wound Care: Sween Lotion (Moisturizing lotion) (Home Health) 2 x Per Week/30 Days Discharge Instructions: Apply moisturizing lotion as directed Secondary Dressing: Zetuvit Plus 4x4 in 2 x Per Week/30 Days Discharge Instructions: or equivalent extra absorbent pad.Apply  over primary dressing as directed. Com pression Wrap: ThreePress (3 layer compression wrap) (Home Health) 2 x Per Week/30 Days Discharge Instructions: Apply three layer compression as directed. Pad bend of ankle with foam or ABD pad. WOUND #69: - Foot Wound Laterality: Dorsal, Right Peri-Wound Care: Zinc Oxide Ointment 30g tube 2 x Per Week/30 Days Discharge Instructions: Apply Zinc Oxiide to weeping areas with each dressing change Peri-Wound Care: Sween Lotion (Moisturizing lotion) (Home Health) 2 x Per Week/30 Days Discharge Instructions: Apply moisturizing lotion as directed Secondary Dressing: Zetuvit Plus 4x4 in 2 x Per Week/30 Days Discharge Instructions: or equivalent extra absorbent pad.Apply over primary dressing as directed. Com pression Wrap: ThreePress (3 layer compression wrap) (Home Health) 2 x Per Week/30 Days Discharge Instructions: Apply three layer compression as directed. Pad bend of ankle with foam or ABD pad. WOUND #70: - Lower Leg Wound Laterality: Right, Anterior Peri-Wound Care: Zinc Oxide Ointment 30g tube 2 x Per Week/30 Days Discharge Instructions: Apply Zinc Oxiide to weeping areas with each dressing change Peri-Wound Care: Sween Lotion (Moisturizing lotion) (Home Health) 2 x Per Week/30 Days Discharge Instructions: Apply moisturizing lotion as directed Secondary Dressing: Zetuvit Plus 4x4 in 2 x Per Week/30 Days Discharge Instructions: or equivalent extra absorbent pad.Apply over primary dressing as directed. Com pression Wrap: ThreePress (3 layer compression wrap) (Home Health) 2 x Per Week/30 Days Discharge Instructions: Apply  three layer compression as directed. Pad bend of ankle with foam or ABD pad. 1. Would recommend currently that we go ahead and continue with the wound care measures as before. Specifically we will get a use just XtraSorb as an absorptive dressing. 2. We would not be using the silver alginate as that just get stuck and the XtraSorb seems to  be doing great. 3. I had a long conversation about compliance and the fact that the patient needs to be daily everything she can to try to keep things under control here. I think she really has not been super compliant with much of anything which is leading to issues here as well. We will see patient back for reevaluation in 1 week here in the clinic. If anything worsens or changes patient will contact our office for additional recommendations. Electronic Signature(s) Signed: 08/29/2020 6:12:23 PM By: [name] PA-C Entered By: [name] on 08/29/2020 18:12:23 -------------------------------------------------------------------------------- SuperBill Details Patient Name: Date of Service: [name] 08/29/2020 Medical Record Number: [id number] Patient Account Number: [id number] Date of Birth/Sex: Treating RN: Nov 17, 1950 (70 y.o. 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 Physician Procedures : CPT4 Code Description Modifier [id number] - WC PHYS LEVEL 3 - EST PT ICD-10 Diagnosis Description E11.622 Type 2 diabetes mellitus with other skin ulcer I89.0 Lymphedema, not elsewhere classified I87.331 Chronic venous hypertension (idiopathic) with  ulcer and inflammation of right lower extremity I87.332 Chronic venous hypertension (idiopathic) with ulcer and inflammation of left lower extremity Quantity: 1 Electronic Signature(s) Signed: 08/29/2020 6:12:37 PM By: [name] PA-C Entered By: [name] on 08/29/2020 18:12:35

## 2020-08-30 NOTE — Progress Notes (Signed)
LAVERTA, HARNISCH ([id number]) Visit Report for 08/29/2020 Arrival Information Details Patient Name: Date of Service: [name], [name] 08/29/2020 10:30 A M Medical Record [name]: [id number] Patient Account [name]: [id number] Date of Birth/Sex: Treating RN: 19-Feb-1951 (70 y.o. [name] Primary Care Corena Tilson: [name] Other Clinician: Referring Ramel Tobon: Treating Asaf Elmquist/Extender: Darlen Round in Treatment: 160 Visit Information History Since Last Visit Added or deleted any medications: No Patient Arrived: Wheel Chair Any new allergies or adverse reactions: No Arrival Time: 10:54 Had a fall or experienced change in No Accompanied By: alone activities of daily living that may affect Transfer Assistance: None risk of falls: Patient Identification Verified: Yes Signs or symptoms of abuse/neglect since last visito No Secondary Verification Process Completed: Yes Hospitalized since last visit: No Patient Requires Transmission-Based Precautions: No Implantable device outside of the clinic excluding No Patient Has Alerts: Yes cellular tissue based products placed in the center Patient Alerts: R ABI= 1.01 since last visit: L ABI = .99 Has Dressing in Place as Prescribed: Yes Has Compression in Place as Prescribed: Yes Pain Present Now: Yes Electronic Signature(s) Signed: 08/30/2020 5:54:24 PM By: Levan Hurst RN, BSN Entered By: Levan Hurst on 08/29/2020 10:54:44 -------------------------------------------------------------------------------- Compression Therapy Details Patient Name: Date of Service: [name]. 08/29/2020 10:30 A M Medical Record [name]: [id number] Patient Account [name]: [id number] Date of Birth/Sex: Treating RN: 08/10/1950 (70 y.o. [name] Primary Care Nicoles Sedlacek: [name] Other Clinician: Referring Jackqulyn Mendel: Treating Glenden Rossell/Extender: Darlen Round in Treatment: 160 Compression Therapy  Performed for Wound Assessment: Wound #61 Left,Circumferential Lower Leg Performed By: Clinician Rhae Hammock, RN Compression Type: Three Layer Post Procedure Diagnosis Same as Pre-procedure Electronic Signature(s) Signed: 08/30/2020 5:44:19 PM By: [name] RN, BSN Entered By: [name] on 08/29/2020 11:30:16 -------------------------------------------------------------------------------- Compression Therapy Details Patient Name: Date of Service: [name]. 08/29/2020 10:30 A M Medical Record [name]: [id number] Patient Account [name]: [id number] Date of Birth/Sex: Treating RN: 08/20/1950 (70 y.o. [name] Primary Care Malichi Palardy: [name] Other Clinician: Referring Julian Medina: Treating Reni Hausner/Extender: Darlen Round in Treatment: 160 Compression Therapy Performed for Wound Assessment: Wound #67 Right,Medial Lower Leg Performed By: Clinician Rhae Hammock, RN Compression Type: Three Layer Post Procedure Diagnosis Same as Pre-procedure Electronic Signature(s) Signed: 08/30/2020 5:44:19 PM By: [name] RN, BSN Entered By: [name] on 08/29/2020 11:30:16 -------------------------------------------------------------------------------- Encounter Discharge Information Details Patient Name: Date of Service: [name]. 08/29/2020 10:30 A M Medical Record [name]: [id number] Patient Account [name]: [id number] Date of Birth/Sex: Treating RN: Sep 02, 1950 (70 y.o. [name], [name] Primary Care Vinay Ertl: [name] Other Clinician: Referring Shameria Trimarco: Treating Jesus Nevills/Extender: Darlen Round in Treatment: [phone] Encounter Discharge Information Items Discharge Condition: Stable Ambulatory Status: Wheelchair Discharge Destination: Home Transportation: Private Auto Accompanied By: self Schedule Follow-up Appointment: Yes Clinical Summary of Care: Electronic Signature(s) Signed: 08/29/2020  4:41:44 PM By: Deon Pilling Entered By: Deon Pilling on 08/29/2020 12:44:11 -------------------------------------------------------------------------------- Lower Extremity Assessment Details Patient Name: Date of Service: [name], [name] 08/29/2020 10:30 A M Medical Record [name]: [id number] Patient Account [name]: [id number] Date of Birth/Sex: Treating RN: 1950/08/22 (70 y.o. [name] Primary Care Mikailah Morel: [name] Other Clinician: Referring Karell Tukes: Treating Talayla Doyel/Extender: Darlen Round in Treatment: 160 Edema Assessment Assessed: [Left: No] [Right: No] Edema: [Left: Yes] [Right: Yes] Calf Left: Right: Point of Measurement: 37 cm From Medial Instep 39 cm 39 cm Ankle Left: Right: Point of Measurement: 10 cm  From Medial Instep 29 cm 24 cm Electronic Signature(s) Signed: 08/30/2020 5:54:24 PM By: Levan Hurst RN, BSN Entered By: Levan Hurst on 08/29/2020 10:59:19 -------------------------------------------------------------------------------- [place] Details Patient Name: Date of Service: [name]. 08/29/2020 10:30 A M Medical Record [name]: [id number] Patient Account [name]: [id number] Date of Birth/Sex: Treating RN: Dec 17, 1950 (70 y.o. [name] Primary Care Naylani Bradner: [name] Other Clinician: Referring Julee Stoll: Treating Reginia Battie/Extender: Darlen Round in Treatment: Bel[place] reviewed with physician Active Inactive Venous Leg Ulcer Nursing Diagnoses: Actual venous Insuffiency (use after diagnosis is confirmed) Knowledge deficit related to disease process and management Goals: Patient will maintain optimal edema control Date Initiated: 08/12/2017 Target Resolution Date: 09/26/2020 Goal Status: Active Patient/caregiver will verbalize understanding of disease process and disease management Date Initiated: 08/12/2017 Date Inactivated:  04/21/2018 Target Resolution Date: 04/24/2018 Goal Status: Met Interventions: Assess peripheral edema status every visit. Compression as ordered Treatment Activities: Therapeutic compression applied : 08/12/2017 Notes: Wound/Skin Impairment Nursing Diagnoses: Impaired tissue integrity Knowledge deficit related to ulceration/compromised skin integrity Goals: Patient/caregiver will verbalize understanding of skin care regimen Date Initiated: 08/12/2017 Target Resolution Date: 09/26/2020 Goal Status: Active Ulcer/skin breakdown will have a volume reduction of 30% by week 4 Date Initiated: 08/05/2017 Date Inactivated: 09/30/2017 Target Resolution Date: 10/03/2017 Goal Status: Met Ulcer/skin breakdown will have a volume reduction of 50% by week 8 Date Initiated: 09/30/2017 Date Inactivated: 10/28/2017 Target Resolution Date: 10/28/2017 Goal Status: Met Interventions: Assess patient/caregiver ability to perform ulcer/skin care regimen upon admission and as needed Assess ulceration(s) every visit Provide education on ulcer and skin care Screen for HBO Treatment Activities: Patient referred to home care : 08/05/2017 Skin care regimen initiated : 08/05/2017 Topical wound management initiated : 08/05/2017 Notes: Electronic Signature(s) Signed: 08/30/2020 5:44:19 PM By: [name] RN, BSN Entered By: [name] on 08/29/2020 11:28:55 -------------------------------------------------------------------------------- Pain Assessment Details Patient Name: Date of Service: [name]. 08/29/2020 10:30 A M Medical Record [name]: [id number] Patient Account [name]: [id number] Date of Birth/Sex: Treating RN: 06-28-1950 (70 y.o. [name] Primary Care Elmina Hendel: [name] Other Clinician: Referring Tane Biegler: Treating Tobi Groesbeck/Extender: Darlen Round in Treatment: 160 Active Problems Location of Pain Severity and Description of Pain Patient Has Paino  Yes Site Locations Pain Location: Pain in Ulcers With Dressing Change: Yes Duration of the Pain. Constant / Intermittento Intermittent Rate the pain. Current Pain Level: 8 Character of Pain Describe the Pain: Throbbing Pain Management and Medication Current Pain Management: Medication: No Cold Application: No Rest: No Massage: No Activity: No T.E.N.S.: No Heat Application: No Leg drop or elevation: No Is the Current Pain Management Adequate: Adequate How does your wound impact your activities of daily livingo Sleep: No Bathing: No Appetite: No Relationship With Others: No Bladder Continence: No Emotions: No Bowel Continence: No Work: No Toileting: No Drive: No Dressing: No Hobbies: No Electronic Signature(s) Signed: 08/30/2020 5:54:24 PM By: Levan Hurst RN, BSN Entered By: Levan Hurst on 08/29/2020 10:55:23 -------------------------------------------------------------------------------- Patient/Caregiver Education Details Patient Name: Date of Service: [name] 3/16/2022andnbsp10:30 [name]: [id number] Patient Account [name]: [id number] Date of Birth/Gender: Treating RN: 1950/09/30 (70 y.o. [name] Primary Care Physician: [name] Other Clinician: Referring Physician: Treating Physician/Extender: Darlen Round in Treatment: 160 Education Assessment Education Provided To: Patient Education Topics Provided Venous: Methods: Explain/Verbal Responses: Reinforcements needed, State content correctly Wound/Skin Impairment: Methods: Explain/Verbal Responses: Reinforcements needed, State content correctly Electronic Signature(s) Signed: 08/30/2020  5:44:19 PM By: [name] RN, BSN Entered By: [name] on 08/29/2020 11:29:43 -------------------------------------------------------------------------------- Wound Assessment Details Patient Name: Date of Service: [name].  08/29/2020 10:30 A M Medical Record [name]: [id number] Patient Account [name]: [id number] Date of Birth/Sex: Treating RN: 04-Feb-1951 (70 y.o. [name] Primary Care Thurmond Hildebran: [name] Other Clinician: Referring Yardley Beltran: Treating Reginold Beale/Extender: Darlen Round in Treatment: 160 Wound Status Wound [name]: 61 Primary Venous Leg Ulcer Etiology: Wound Location: Left, Circumferential Lower Leg Wound Open Wounding Event: Gradually Appeared Status: Date Acquired: 04/20/2019 Comorbid Asthma, Hypertension, Peripheral Arterial Disease, Peripheral Weeks Of Treatment: 71 History: Venous Disease, Type II Diabetes, Gout, Osteoarthritis Clustered Wound: Yes Photos Wound Measurements Length: (cm) 8 Width: (cm) 24 Depth: (cm) 0.1 Clustered Quantity: 2 Area: (cm) 150.796 Volume: (cm) 15.08 % Reduction in Area: -1899.9% % Reduction in Volume: -1900% Epithelialization: Medium (34-66%) Tunneling: No Undermining: No Wound Description Classification: Full Thickness Without Exposed Support Stru Wound Margin: Indistinct, nonvisible Exudate Amount: Medium Exudate Type: Serous Exudate Color: amber ctures Foul Odor After Cleansing: No Slough/Fibrino No Wound Bed Granulation Amount: Large (67-100%) Exposed Structure Granulation Quality: Pink, Pale Fascia Exposed: No Necrotic Amount: None Present (0%) Fat Layer (Subcutaneous Tissue) Exposed: Yes Tendon Exposed: No Muscle Exposed: No Joint Exposed: No Bone Exposed: No Treatment Notes Wound #61 (Lower Leg) Wound Laterality: Left, Circumferential Cleanser Peri-Wound Care Zinc Oxide Ointment 30g tube Discharge Instruction: Apply Zinc Oxiide to weeping areas with each dressing change Sween Lotion (Moisturizing lotion) Discharge Instruction: Apply moisturizing lotion as directed Topical Primary Dressing Secondary Dressing Zetuvit Plus 4x4 in Discharge Instruction: or equivalent extra absorbent pad.Apply  over primary dressing as directed. Secured With Compression Wrap ThreePress (3 layer compression wrap) Discharge Instruction: Apply three layer compression as directed. Pad bend of ankle with foam or ABD pad. Compression Stockings Add-Ons Electronic Signature(s) Signed: 08/30/2020 12:58:14 PM By: Sandre Kitty Signed: 08/30/2020 5:54:24 PM By: Levan Hurst RN, BSN Entered By: Sandre Kitty on 08/29/2020 16:48:41 -------------------------------------------------------------------------------- Wound Assessment Details Patient Name: Date of Service: [name]. 08/29/2020 10:30 A M Medical Record [name]: [id number] Patient Account [name]: [id number] Date of Birth/Sex: Treating RN: [birth year] (70 y.o. [name] Primary Care Katianne Barre: [name] Other Clinician: Referring Elizabeth Haff: Treating Ajit Errico/Extender: Darlen Round in Treatment: 160 Wound Status Wound [name]: 64 Primary Diabetic Wound/Ulcer of the Lower Extremity Etiology: Wound Location: Left, Dorsal Foot Wound Open Wounding Event: Gradually Appeared Status: Date Acquired: 06/01/2019 Comorbid Asthma, Hypertension, Peripheral Arterial Disease, Peripheral Weeks Of Treatment: 65 History: Venous Disease, Type II Diabetes, Gout, Osteoarthritis Clustered Wound: No Photos Wound Measurements Length: (cm) 6 Width: (cm) 6.2 Depth: (cm) 0.1 Area: (cm) 29.217 Volume: (cm) 2.922 % Reduction in Area: -17607.3% % Reduction in Volume: -5863.3% Epithelialization: Medium (34-66%) Tunneling: No Undermining: No Wound Description Classification: Grade 1 Wound Margin: Flat and Intact Exudate Amount: Medium Exudate Type: Serous Exudate Color: amber Foul Odor After Cleansing: No Slough/Fibrino No Wound Bed Granulation Amount: Large (67-100%) Exposed Structure Granulation Quality: Pink, Pale Fascia Exposed: No Necrotic Amount: None Present (0%) Fat Layer (Subcutaneous Tissue) Exposed:  Yes Tendon Exposed: No Muscle Exposed: No Joint Exposed: No Bone Exposed: No Treatment Notes Wound #64 (Foot) Wound Laterality: Dorsal, Left Cleanser Peri-Wound Care Zinc Oxide Ointment 30g tube Discharge Instruction: Apply Zinc Oxiide to weeping areas with each dressing change Sween Lotion (Moisturizing lotion) Discharge Instruction: Apply moisturizing lotion as directed Topical Primary Dressing Secondary Dressing Zetuvit Plus 4x4 in Discharge Instruction: or equivalent extra  absorbent pad.Apply over primary dressing as directed. Secured With Compression Wrap ThreePress (3 layer compression wrap) Discharge Instruction: Apply three layer compression as directed. Pad bend of ankle with foam or ABD pad. Compression Stockings Add-Ons Electronic Signature(s) Signed: 08/30/2020 12:58:14 PM By: Sandre Kitty Signed: 08/30/2020 5:54:24 PM By: Levan Hurst RN, BSN Entered By: Sandre Kitty on 08/29/2020 16:48:09 -------------------------------------------------------------------------------- Wound Assessment Details Patient Name: Date of Service: [name]. 08/29/2020 10:30 A M Medical Record [name]: [id number] Patient Account [name]: [id number] Date of Birth/Sex: Treating RN: [birth year] (70 y.o. [name] Primary Care Trexton Escamilla: [name] Other Clinician: Referring Cordell Guercio: Treating Mistina Coatney/Extender: Darlen Round in Treatment: 160 Wound Status Wound [name]: 67 Primary Diabetic Wound/Ulcer of the Lower Extremity Etiology: Wound Location: Right, Medial Lower Leg Wound Open Wounding Event: Gradually Appeared Status: Date Acquired: 05/02/2020 Comorbid Asthma, Hypertension, Peripheral Arterial Disease, Peripheral Weeks Of Treatment: 17 History: Venous Disease, Type II Diabetes, Gout, Osteoarthritis Clustered Wound: No Photos Wound Measurements Length: (cm) 3.5 Width: (cm) 3.6 Depth: (cm) 0.1 Area: (cm) 9.896 Volume: (cm)  0.99 % Reduction in Area: -599.9% % Reduction in Volume: -602.1% Epithelialization: Small (1-33%) Tunneling: No Undermining: No Wound Description Classification: Grade 1 Wound Margin: Thickened Exudate Amount: Medium Exudate Type: Serous Exudate Color: amber Foul Odor After Cleansing: No Slough/Fibrino No Wound Bed Granulation Amount: Large (67-100%) Exposed Structure Granulation Quality: Pink, Pale Fascia Exposed: No Necrotic Amount: None Present (0%) Fat Layer (Subcutaneous Tissue) Exposed: Yes Tendon Exposed: No Muscle Exposed: No Joint Exposed: No Bone Exposed: No Treatment Notes Wound #67 (Lower Leg) Wound Laterality: Right, Medial Cleanser Peri-Wound Care Zinc Oxide Ointment 30g tube Discharge Instruction: Apply Zinc Oxiide to weeping areas with each dressing change Sween Lotion (Moisturizing lotion) Discharge Instruction: Apply moisturizing lotion as directed Topical Primary Dressing Secondary Dressing Zetuvit Plus 4x4 in Discharge Instruction: or equivalent extra absorbent pad.Apply over primary dressing as directed. Secured With Compression Wrap ThreePress (3 layer compression wrap) Discharge Instruction: Apply three layer compression as directed. Pad bend of ankle with foam or ABD pad. Compression Stockings Add-Ons Electronic Signature(s) Signed: 08/30/2020 12:58:14 PM By: Sandre Kitty Signed: 08/30/2020 5:54:24 PM By: Levan Hurst RN, BSN Entered By: Sandre Kitty on 08/29/2020 16:49:36 -------------------------------------------------------------------------------- Wound Assessment Details Patient Name: Date of Service: [name]. 08/29/2020 10:30 A M Medical Record [name]: [id number] Patient Account [name]: [id number] Date of Birth/Sex: Treating RN: 1950/10/30 (70 y.o. [name] Primary Care Luvern Mcisaac: [name] Other Clinician: Referring Clent Damore: Treating Leeann Bady/Extender: Darlen Round in Treatment:  160 Wound Status Wound [name]: 69 Primary Lymphedema Etiology: Wound Location: Right, Dorsal Foot Secondary Diabetic Wound/Ulcer of the Lower Extremity Wounding Event: Gradually Appeared Etiology: Date Acquired: 07/25/2020 Wound Open Weeks Of Treatment: 5 Status: Clustered Wound: No Comorbid Asthma, Hypertension, Peripheral Arterial Disease, Peripheral History: Venous Disease, Type II Diabetes, Gout, Osteoarthritis Photos Wound Measurements Length: (cm) 0.7 Width: (cm) 0.7 Depth: (cm) 0.1 Area: (cm) 0.385 Volume: (cm) 0.038 % Reduction in Area: 87.7% % Reduction in Volume: 87.9% Epithelialization: Small (1-33%) Tunneling: No Undermining: No Wound Description Classification: Full Thickness Without Exposed Support Structures Wound Margin: Distinct, outline attached Exudate Amount: Medium Exudate Type: Serosanguineous Exudate Color: red, brown Foul Odor After Cleansing: No Slough/Fibrino No Wound Bed Granulation Amount: Large (67-100%) Exposed Structure Granulation Quality: Pink, Pale Fascia Exposed: No Necrotic Amount: None Present (0%) Fat Layer (Subcutaneous Tissue) Exposed: Yes Tendon Exposed: No Muscle Exposed: No Joint Exposed: No Bone Exposed: No Treatment Notes Wound #69 (Foot) Wound  Laterality: Dorsal, Right Cleanser Peri-Wound Care Zinc Oxide Ointment 30g tube Discharge Instruction: Apply Zinc Oxiide to weeping areas with each dressing change Sween Lotion (Moisturizing lotion) Discharge Instruction: Apply moisturizing lotion as directed Topical Primary Dressing Secondary Dressing Zetuvit Plus 4x4 in Discharge Instruction: or equivalent extra absorbent pad.Apply over primary dressing as directed. Secured With Compression Wrap ThreePress (3 layer compression wrap) Discharge Instruction: Apply three layer compression as directed. Pad bend of ankle with foam or ABD pad. Compression Stockings Add-Ons Electronic Signature(s) Signed: 08/30/2020 12:58:14  PM By: Sandre Kitty Signed: 08/30/2020 5:54:24 PM By: Levan Hurst RN, BSN Entered By: Sandre Kitty on 08/29/2020 16:49:07 -------------------------------------------------------------------------------- Wound Assessment Details Patient Name: Date of Service: [name]. 08/29/2020 10:30 A M Medical Record [name]: [id number] Patient Account [name]: [id number] Date of Birth/Sex: Treating RN: [birth year] (70 y.o. [name] Primary Care Edyn Popoca: [name] Other Clinician: Referring Jazsmine Macari: Treating Antoinetta Berrones/Extender: Darlen Round in Treatment: 160 Wound Status Wound [name]: 70 Primary Venous Leg Ulcer Etiology: Wound Location: Right, Anterior Lower Leg Wound Open Wounding Event: Gradually Appeared Status: Date Acquired: 08/29/2020 Comorbid Asthma, Hypertension, Peripheral Arterial Disease, Peripheral Weeks Of Treatment: 0 History: Venous Disease, Type II Diabetes, Gout, Osteoarthritis Clustered Wound: No Photos Wound Measurements Length: (cm) 3 Width: (cm) 0.5 Depth: (cm) 0.1 Area: (cm) 1.178 Volume: (cm) 0.118 % Reduction in Area: 0% % Reduction in Volume: 0% Epithelialization: None Tunneling: No Undermining: No Wound Description Classification: Full Thickness Without Exposed Support Structures Wound Margin: Flat and Intact Exudate Amount: Medium Exudate Type: Serosanguineous Exudate Color: red, brown Foul Odor After Cleansing: No Slough/Fibrino No Wound Bed Granulation Amount: Large (67-100%) Exposed Structure Granulation Quality: Pink Fascia Exposed: No Necrotic Amount: None Present (0%) Fat Layer (Subcutaneous Tissue) Exposed: Yes Tendon Exposed: No Muscle Exposed: No Joint Exposed: No Bone Exposed: No Treatment Notes Wound #70 (Lower Leg) Wound Laterality: Right, Anterior Cleanser Peri-Wound Care Zinc Oxide Ointment 30g tube Discharge Instruction: Apply Zinc Oxiide to weeping areas with each dressing  change Sween Lotion (Moisturizing lotion) Discharge Instruction: Apply moisturizing lotion as directed Topical Primary Dressing Secondary Dressing Zetuvit Plus 4x4 in Discharge Instruction: or equivalent extra absorbent pad.Apply over primary dressing as directed. Secured With Compression Wrap ThreePress (3 layer compression wrap) Discharge Instruction: Apply three layer compression as directed. Pad bend of ankle with foam or ABD pad. Compression Stockings Add-Ons Electronic Signature(s) Signed: 08/30/2020 12:58:14 PM By: Sandre Kitty Signed: 08/30/2020 5:54:24 PM By: Levan Hurst RN, BSN Entered By: Sandre Kitty on 08/29/2020 16:50:20 -------------------------------------------------------------------------------- Vitals Details Patient Name: Date of Service: [name]. 08/29/2020 10:30 A M Medical Record [name]: [id number] Patient Account [name]: [id number] Date of Birth/Sex: Treating RN: 21-Aug-1950 (70 y.o. [name] Primary Care Murad Staples: [name] Other Clinician: Referring Marquan Vokes: Treating Norris Bodley/Extender: Darlen Round in Treatment: 160 Vital Signs Time Taken: 10:54 Temperature (F): 99.0 Height (in): 62 Pulse (bpm): 96 Weight (lbs): 335 Respiratory Rate (breaths/min): 18 Body Mass Index (BMI): 61.3 Blood Pressure (mmHg): 145/81 Capillary Blood Glucose (mg/dl): 245 Reference Range: 80 - 120 mg / dl Notes glucose per pt report Electronic Signature(s) Signed: 08/30/2020 5:54:24 PM By: Levan Hurst RN, BSN Entered By: Levan Hurst on 08/29/2020 10:55:07

## 2020-09-12 ENCOUNTER — Encounter (HOSPITAL_BASED_OUTPATIENT_CLINIC_OR_DEPARTMENT_OTHER): Payer: Medicare PPO | Admitting: Physician Assistant

## 2020-09-12 ENCOUNTER — Other Ambulatory Visit (HOSPITAL_COMMUNITY)
Admission: RE | Admit: 2020-09-12 | Discharge: 2020-09-12 | Disposition: A | Discharge status: Home / Self Care | Payer: Medicare PPO | Source: Other Acute Inpatient Hospital | Attending: Physician Assistant | Admitting: Physician Assistant

## 2020-09-12 ENCOUNTER — Other Ambulatory Visit: Payer: Self-pay

## 2020-09-12 DIAGNOSIS — E11622 Type 2 diabetes mellitus with other skin ulcer: Secondary | ICD-10-CM | POA: Diagnosis not present

## 2020-09-12 DIAGNOSIS — L089 Local infection of the skin and subcutaneous tissue, unspecified: Secondary | ICD-10-CM | POA: Diagnosis present

## 2020-09-12 NOTE — Progress Notes (Addendum)
[name], [name] ([id number]) Visit Report for 09/12/2020 Chief Complaint Document Details Patient Name: Date of Service: [name], [name] 09/12/2020 8:30 A M Medical Record Number: [id number] Patient Account Number: [id number] Date of Birth/Sex: Treating RN: 1951-01-18 (70 y.o. [name] Primary Care Provider: [name] Other Clinician: Referring Provider: Treating Provider/Extender: Darlen Round in Treatment: [phone] Information Obtained from: Patient Chief Complaint Bilateral reoccurring LE ulcers Electronic Signature(s) Signed: 09/12/2020 9:21:27 AM By: [name] PA-C Entered By: [name] on 09/12/2020 09:21:27 -------------------------------------------------------------------------------- HPI Details Patient Name: Date of Service: [name]. 09/12/2020 8:30 A M Medical Record Number: [id number] Patient Account Number: [id number] Date of Birth/Sex: Treating RN: [birth year] (70 y.o. [name] Primary Care Provider: [name] Other Clinician: Referring Provider: Treating Provider/Extender: Darlen Round in Treatment: 24 History of Present Illness HPI Description: this patient has been seen a couple of times before and returns with recurrent problems to her right and left lower extremity with swelling and weeping ulcerations due to not wearing her compression stockings which she had been advised to do during her last discharge, at the end of June 2018. During her last visit the patient had had normal arterial blood flow and her venous reflux study did not necessitate any surgical intervention. She was recommended compression and elevation and wound care. After prolonged treatment the patient was completely healed but she has been noncompliant with wearing or compressions.. She was here last week with an outpatient return visit planned but the patient came in a very poor general condition with altered  mental status and was rushed to the ER on my request. With a history of hypertension, diabetes, TIA and right-sided weakness she was set up for an MRI on her brain and cervical spine and was sent to [facility]. Getting an MRI done was very difficult but once the workup was done she was found not to have any spinal stenosis, epidural abscess or hematoma or discitis. This was radiculopathy to be treated as an outpatient and she was given a follow-up appointment. Today she is feeling much better alert and oriented and has come to reevaluate her bilateral lower extremity lymphedema and ulceration 03/25/2017 -- she was admitted to the hospital on 03/16/2017 and discharged on 03/18/2017 with left leg cellulitis and ulceration. She was started on vancomycin and Zosyn and x-ray showed no bony involvement. She was treated for a cellulitis with IV antibiotics changed to Rocephin and Flagyl and was discharged on oral Keflex and doxycycline to complete a 7 day course. Last hemoglobin A1c was 7.1 and her other ailments including hypertension got asthma were appropriately treated. 05/06/2017 -- she is awaiting the right size of compression stockings from  but other than that has been doing well. ====== Old notes 70 year old patient was seen one time last October and was lost to follow-up. She has recurrent problems with weeping and ulceration of her left lower extremity and has swelling of this for several years. It has been worse for the last 2 months. Past medical history is significant for diabetes mellitus type 2, hypertension, gout, morbid obesity, depressive disorders, hiatal hernia, migraines, status post knee surgery, risk of a cholecystectomy, vaginal hysterectomy and breast biopsy. She is not a smoker. As noted before she has never had a venous duplex study and an arterial ABI study was attempted but the left lower extremity was noncompressible 10/01/2016 -- had a lower extremity venous duplex  reflux evaluation which showed  no evidence of deep vein reflux in the right or left lower extremity, and no evidence of great saphenous vein reflux more than 500 ms in the right or left lower extremity, and the left small saphenous vein is incompetent but no vascular consult was recommended. review of her electronic medical records noted that the ABI was checked in July 2017 where the right ABI was normal limits and the left ABI could not be ascertained due to pain with cuff pressure but the waveforms are within normal limits. her arterial duplex study scheduled for April 27. 10/08/2016 -- the patient has various reasons for not having a compression on and for the last 3 days she has had no compression on her left lower extremity either due to pain or the lack of nursing help. She does not use her juxta lites either. 10/15/2016 -- the patient did not keep her appointment for arterial duplex study on April 27 and I have asked her to reschedule this. Her pain is out of proportion with the physical findings and she continuously fails to wear a compression wraps and cuts them off because she says she cannot tolerate the pain. She does not use her juxta lites either. 10/22/2016 -- he has rescheduled her arterial duplex study to May 21 and her pain today is a bit better. She has not been wearing her juxta lites on her right lower extremity but now understands that she needs to do this. She did tolerate the to press compression wrap on her left lower extremity 10/29/2016 --arterial duplex study is scheduled for next week and overall she has been tolerating her compression wraps and also using her juxta lites on her right lower extremity 11/05/2016 -- the right ABI was 0.95 the left was 1.03. The digit TBI is on the right was 0.83 on the left was 0.92 and she had biphasic flow through these vessels. The impression was that of normal lower extremity arterial study. 11/12/2016 -- her pain is minimal and she  is doing very well overall. 11/26/2016 -- she has got juxta lites and her insurance will not pay for additional dual layer compression stockings. She is going to order some from [place]. 05/12/2017 -- her juxta lites are very old and too big for her and these have not been helping with compression. She did get 20-30 mm compression stockings from [employer] but she and her husband are unable to put these on. I believe she will benefit from bilateral Extremit-ease, compression stockings and we will measure her for these today. 05/20/2017 -- lymphedema on the left lower extremity has increased a lot and she has a open ulceration as a result of this. The right lower extremity is looking pretty good. She has decided to by the compression stockings herself and will get reimbursed by the home health, at a later date. 05/27/2017 -- her sciatica is bothering her a lot and she thought her left leg pain was caused due to the compression wrap and hence removed it and has significant lymphedema. There is no inflammation on this left lower extremity. 06/17/17 on evaluation today patient appears to be doing very well and in fact is completely healed in regard to her ulcerations. Unfortunately however she does have continued issues with lymphedema nonetheless. We did order compression garments for her unfortunately she states that the size that she received were large although we ordered medium. Obviously this means she is not getting the optimal compression. She does not have those with her today and therefore  we could not confirm and contact the company on her behalf. Nonetheless she does state that she is going to have her husband bring them by tomorrow so that we can verify and then get in touch with the company. No fevers, chills, nausea, or vomiting noted at this time. Overall patient is doing better otherwise and I'm pleased with the progress she has made. 07/01/17 on evaluation today patient appears to be doing  very well in regard to her bilateral lower extremity she does not have any openings at this point which is excellent news. Overall I'm pleased with how things have progressed up to this time. Since she is doing so well we did order her compression which we are seeing her today to ensure that it fits her properly and everything is doing well in that regard and then subsequently she will be discharged. ============ Old Notes: 03/31/16 patient presents today for evaluation concerning open wounds that she has over the left medial ankle region as well as the left dorsal foot. She has previously had this occur although it has been healed for a number of months after having this for about a year prior until her hospitalization on 01/05/16. At that point in time it appears that she was admitted to the hospital for left lower extremity cellulitis and was placed on vancomycin and Zosyn at that point. Eventually upon discharge on January 15, 2016 she was placed on doxycycline at that point in time. Later on 03/27/16 positive wound culture growing Escherichia coli this was switched to amoxicillin. Currently she tells me that she is having pain radiated to be a 7 out of 10 which can be as high as 10 out of 10 with palpation and manipulation of the wound. This wound appears to be mainly venous in nature due to the bilateral lower extremity venous stasis/lymphedema. This is definitely much worse on her left than the right side. She does have type 1 diabetes mellitus, hypertension, morbid obesity, and is wheelchair dependent.during the course of the hospital stay a blood culture was also obtained and fortunately appeared negative. She also had an x-ray of the tibia/fibula on the left which showed no acute bone abnormality. Her white blood cell count which was performed last on 03/25/16 was 7.3, hemoglobin 12.8, protein 7.1, albumin 3.0. Her urine culture appeared to be negative for any specific organisms. Patient did  have a left lower extremity venous duplex evaluation for DVT . This did not include venous reflux studies but fortunately was negative for DVT Patient also had arterial studies performed which revealed that she had a . normal ABI on the right though this was unable to be performed on the left secondary to pain that she was having around the ankle region due to the wound. However it was stated on report that she had biphasic pulses and apparently good blood flow. ========== 06/03/17 she is here in follow-up evaluation for right lower extremity ulcer. The right lower sure he has healed but she has reopened to the left medial malleolus and dorsal foot with weeping. She is waiting for new compression garments to arrive from home health, the previous compression garments were ill fitting. We will continue with compression bilaterally and follow-up in 2 weeks Readmission: 08/05/17 on evaluation today patient appears to be doing somewhat poorly in regard to her left lower extremity especially although the right lower extremity has a small area which may no longer be open. She has been having a lot of drainage from the  left lower extremity however he tells me that she has not been able to use the EXTREMIT-EASE Compression at this point. She states that she did better and was able to actually apply the Juxta-Lite compression although the wound that she has is too large and therefore really does not compress which is why she cannot wear it at this point. She has no one who can help her put it on regular basis her son can sometimes but he's not able to do it most of the time. I do believe that's why she has begun to weave and have issues as she is currently yet again. No fevers, chills, nausea, or vomiting noted at this time. Patient is no evidence of dementia. 08/12/17 on evaluation today patient appears to still be doing fairly well in regard to the draining areas/weeping areas at this point. With that being  said she unfortunately did go to the ER yesterday due to what was felt to be possibly a cellulitis. They place her on doxycycline by mouth and discharge her home. She definitely was not admitted. With that being said she states she has had more discomfort which has been unusual for her even compared to prior times and she's had infections.08/12/17 on evaluation today patient appears to still be doing fairly well in regard to the draining areas/weeping areas at this point. With that being said she unfortunately did go to the ER yesterday due to what was felt to be possibly a cellulitis. They place her on doxycycline by mouth and discharge her home. She definitely was not admitted. With that being said she states she has had more discomfort which has been unusual for her even compared to prior times and she's had infections. 08/19/17 put evaluation today patient tells me that she's been having a lot of what sounds to be neuropathic type pain in regard to her left lower extremity. She has been using over-the-counter topical bins again which some believe. That in order to apply the she actually remove the wrap we put on her last Wednesday on Thursday. Subsequently she has not had anything on compression wise since that time. The good news is a lot of the weeping areas appear to have closed at this point again I believe she would do better with compression but we are struggling to get her to actually use what she needs to at this point. No fevers, chills, nausea, or vomiting noted at this time. 09/03/17 on evaluation today patient appears to be doing okay in regard to her lower extremities in regard to the lymphedema and weeping. Fortunately she does not seem to show any signs of infection at this point she does have a little bit of weeping occurring in the right medial malleolus area. With that being said this does not appear to be too significant which is good news. 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 She has severe skin damage secondary to both of these features involving the dorsal left foot and medial left ankle and lower leg. Still has open areas in the left anterior foot. The area on the right closed over. She uses her own juxta light stockings. She does not have an arterial issue 09/16/17 on evaluation today patient actually appears to be doing excellent in regard to her bilateral lower extremity swelling. The Juxta-Lite compression wrap seem to be doing very well for her. She has not however been using the portion that goes over her foot.  Her left foot still is draining a little bit not nearly as significant as it has been in the past but still I do believe that she likely needs to utilize the full wrap including the foot portion of this will improve as well. She also has been apparently putting on a significant amount of Vaseline which also think is not helpful for her. I recommended that if she feels she needs something for moisturizer Eucerin will probably be better. 09/30/17 on evaluation today patient presents with several new open areas in regard to her left lower extremity although these appear to be minimal and mainly seem to be more moisture breakdown than anything. Fortunately she does not seem to have any evidence of infection which is great news. She has been tolerating the dressing changes without complication we are using silver alginate on the foot she has been using AB pads to have the legs and using her Juxta- Lite compression which seems to be controlling her swelling very well. Overall I'm pleased with the poor way she has progressed. 10/14/17 on evaluation today patient appears to be doing better in regard to her left lower extremity areas of weeping. She does still have some discomfort although in general this does not appear to be as macerated and I think it is progressing nicely. I do think she still  needs to wear the foot portion of her Juxta- Lite in order to get the most benefit from the wrap obviously. She states she understands. Fortunately there does not appear to be evidence of infection at this time which is great news. 10/28/17 on evaluation today patient appears to be doing excellent in regard to her left lower extremity. She has just a couple areas that are still open and seem to be causing any trouble whatsoever. For that reason I think that she is definitely headed in the right direction the spots are very tiny compared to what we have been dealing with in the past. 11/11/17 on evaluation today patient appears to have a right lateral lower extremity ulcer that has opened since I last saw her. She states this is where the home health nurse that was coming out remove the dressing without wetting the alginate first. Nonetheless I do not know if this is indeed the case or not but more importantly we have not ordered home help to be coming out for her wounds at all. I'm unsure as to why they are coming out and we're gonna have to check on this and get things situated in that regard. With that being said we currently really do not need them to be coming out as the patient has been taking care of her leg herself without complication and no issues. In fact she was doing much better prior to nursing coming out. 11/25/17 on evaluation today patient actually appears to be doing fairly well in regard to her left lower extremity swelling. In fact she has very little area of weeping at this point there's just a small spot on the lateral portion of her right leg that still has me just a little bit more concerned as far as wanting to see this clear up before I discharge her to caring for this at home. Nonetheless overall she has made excellent progress. 12/09/17 on evaluation today patient appears to be doing rather well in regard to her lower extremity edema. She does have some weeping still in the left  lower extremity although the big area we were taking care of two  weeks ago actually has closed and she has another area of weeping on the left lower extremity immediately as well is the top of her foot. She does not currently have lymphedema pumps she has been wearing her compression daily on a regular basis as directed. With that being said I think she may benefit from lymphedema pumps. 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 We had a very difficult time getting and keeping this under control. 12/23/17 on evaluation today patient actually appears to be doing a little bit more poorly in regard to her bilateral lower extremities. She has been tolerating the Juxta-Lite compression wraps. Unfortunately she has two new ulcers on the right lower extremity and left lower Trinity ulceration seems to be larger. Obviously this is not good news. She has been tolerating the dressings without complication. 12/30/17 on evaluation today patient actually appears to be doing much better in regard to her bilateral lower extremity edema. She continues to have some issues with ulcerations and in fact there appears to be one spot on each leg where the wrap may have caused a little bit of a blister which is subsequently opened up at this point is given her pain. Fortunately it does not appear to be any evidence of infection which is good news. No fevers chills noted. 01/13/18 on evaluation today patient appears to be doing rather well in regard to her bilateral lower extremities. The dressings did get kind of stuck as far as the wound beds are concerned but again I think this is mainly due to the fact that she actually seems to be showing signs of healing which is good news. She's not having as much drainage therefore she was having more of the dressing sticking.  Nonetheless overall I feel like her swelling is dramatically down compared to previous. 01/20/18 on evaluation today patient unfortunately though she's doing better in most regards has a large blister on the left anterior lower extremity where she is draining quite significantly. Subsequently this is going to need debridement today in order to see what's underneath and ensure she does not continue to trapping fluid at this location. Nonetheless No fevers, chills, nausea, or vomiting noted at this time. 01/27/18 on evaluation today patient appears to be doing rather well at this point in regard to her right lower extremity there's just a very small area that she still has open at this point. With that being said I do believe that she is tolerating the compression wraps very well in making good progress. Home health is coming out at this point to see her. Her left lower extremity on the lateral portion is actually what still mainly open and causing her some discomfort for the most part 02/10/18 on evaluation today patient actually appears to be doing very well in regard to her right lower extremity were all the ulcers appear to be completely close. In regard to the left lower extremity she does have two areas still open and some leaking from the dorsal surface of her foot but this still seems to be doing much better to me in general. 02/24/18 on evaluation today patient actually appears to be doing much better in regard to her right lower extremity this is still completely healed. Her left lower extremity is also doing much better fortunately she has no evidence of infection. The one  area that is gonna require some debridement is still on the left anterior shin. Fortunately this is not hurting her as badly today. 03/10/18 on evaluation today patient appears to be doing better in some regards although she has a little bit more open area on the dorsal foot and she also has some issues on the medial portion of  the left lower extremity which is actually new and somewhat deep. With that being said there fortunately does not appear to be any significant signs of infection which is good news. No fevers, chills, nausea, or vomiting noted at this time. In general her swelling seems to be doing fairly well which is good news. 03/31/18 on evaluation today patient presents for follow-up concerning her left lower extremity lymphedema. Unfortunately she has been doing a little bit more poorly since I last saw her in regard to the amount of weeping that she is experiencing. She's also having some increased pain in the anterior shin location. Unfortunately I do not feel like the patient is making such good progress at this point a few weeks back she was definitely doing much better. 04/07/18 on evaluation today patient actually appears to be showing some signs of improvement as far as the left lower extremity is concerned. She has been tolerating the dressing changes and it does appear that the Drawtex did better for her. With that being said unfortunately home health is stating that they cannot obtain the Drawtex going forward. Nonetheless we're gonna have to check and see what they may be able to get the alginate they were using was getting stuck in causing new areas of skin being pulled all that with and subsequently weep and calls her to worsen overall this is the first time we've seen improvement at this time. 04/14/18 on evaluation today patient actually appears to be doing rather well at this point there does not appear to be any evidence of infection at this time and she is actually doing excellent in regard to the weeping in fact she almost has no openings remaining even compared to just last week this is a dramatic improvement. No fevers chills noted 04/21/18 evaluation today patient actually appears to be doing very well. She in fact is has a small area on the posterior lower extremity location and she has  a small area on the dorsal surface of her foot that are still open both of which are very close to closing. We're hoping this will be close shortly. She brought her Juxta-Lite wrap with her today hoping that would be able to put her in it unfortunately I don't think were quite at that point yet but we're getting closer. 04/28/18 upon evaluation today patient actually appears to be doing excellent in regard to her left lower extremity ulcer. In fact the region on the posterior lower extremity actually is much smaller than previously noted. Overall I'm very happy with the progress she has made. She again did bring her Juxta-Lite although we're not quite ready for that yet. 05/11/18 upon evaluation today patient actually appears to be doing in general fairly well in regard to her left lower Trinity. The swelling is very well controlled. With that being said she has a new area on the left anterior lower extremity as well as between the first and second toes of her left foot that was not present during the last evaluation. The region of her posterior left lower extremity actually appears to be almost completely healed. T be honest I'm very pleased  with o the way that stands. Nonetheless I do believe that the lotion may be keeping the area to moist as far as her legs are concerned subsequently I'm gonna consider discontinuing that today. 05/26/18 on evaluation today patient appears to be doing rather well in regard to her left lower should be ulcers. In fact everything appears to be close except for a very small area on the left posterior lower extremity. Fortunately there does not appear to be any evidence of infection at this time. Overall very pleased with her progress. 06/02/18 and evaluation today patient actually appears to be doing very well in regard to her lower extremity ulcers. She has one small area that still continues to weep that I think may benefit her being able to justify lotion and user  Juxta-Lite wraps versus continued to wrap her. Nonetheless I think this is something we can definitely look into at this point. 06/23/18 on evaluation today patient unfortunately has openings of her bilateral lower extremities. In general she seems to be doing much worse than when I last saw her just as far as her overall health standpoint is concerned. She states that her discomfort is mainly due to neuropathy she's not having any other issues otherwise. No fevers, chills, nausea, or vomiting noted at this time. 06/30/18 on evaluation today patient actually appears to be doing a little worse in regard to her right lower extremity her left lower extremity of doing fairly well. Fortunately there is no sign of infection at this time. She has been tolerating the dressing changes without complication. Home health did not come out like they were supposed to for the appropriate wrap changes. They stated that they never received the orders from [place] which were fax. Nonetheless we will send a copy of the orders with the patient today as well. 07/07/18 on evaluation today patient appears to be doing much better in regard to lower extremities. She still has several openings bilaterally although since I last saw her her legs did show obvious signs of infection when she later saw her nurse. Subsequently a culture was obtained and she is been placed on Bactrim and Keflex. Fortunately things seem to be looking much better it does appear she likely had an infection. Again last week we'd even discussed it but again there really was not any obvious sign that she had infection therefore we held off on the antibiotics. Nonetheless I'm glad she's doing better today. 07/14/18 on evaluation today patient appears to be doing much better regarding her bilateral lower Trinity's. In fact on the right lower for me there's nothing open at this point there are some dry skin areas at the sites where she had infection. Fortunately there is  no evidence of systemic infection which is excellent news. No fevers chills noted 07/21/18 on evaluation today patient actually appears to be doing much better in regard to her left lower extremity ulcers. She is making good progress and overall I feel like she's improving each time I see her. She's having no pain I do feel like the infection is completely resolved which is excellent news. No fevers, chills, nausea, or vomiting noted at this time. 07/28/18 on evaluation today patient appears to be doing very well in regard to her left lower [employer]. Everything seems to be showing signs of improvement which is excellent news. Overall very pleased with the progress that has been made. Fortunately there's no evidence of active infection at this time also excellent news. 08/04/18 on evaluation  today patient appears to be doing more poorly in regard to her bilateral lower extremities. She has two new areas open up on the right and these were completely closed as of last week. She still has the two spots on the left which in my pinion seem to be doing better. Fortunately there's no evidence of infection again at this point. 08/11/18 on evaluation today patient actually appears to be doing very well in regard to her bilateral lower Trinity wounds that all seem to be doing better and are measures smaller today. Fortunately there's no signs of infection. No fevers, chills, nausea, or vomiting noted at this time. 08/18/18 on evaluation today patient actually appears to be doing about the same inverter bilateral lower extremities. She continues to have areas that blistering open as was drain that fortunately nothing too significant. Overall I feel like Drawtex may have done better for her however compared to the collagen. 08/25/18 on evaluation today patient appears to be doing a little bit more poorly today even compared to last time I saw her. Again I'm not exactly sure why she's making worse progress over  the past several weeks. I'm beginning to wonder if there is some kind of underlying low level infection causing this issue. I did actually take a culture from the left anterior lower extremity but it was a new wound draining quite a bit at this point. Unfortunately she also seems to be having more pain which is what also makes me worried about the possibility of infection. This is despite never erythema noted at this point. 09/01/18 on evaluation today patient actually appears to be doing a little worse even compared to last week in regard to bilateral lower extremities. She did go to the hospital on the 16th was given a dose of IV Zosyn and then discharged with a recommendation to continue with the Bactrim that I previously prescribed for her. Nonetheless she is still having a lot of discomfort she tells me as well at this time. This is definitely unfortunate. No fevers, chills, nausea, or vomiting noted at this time. 09/08/18 on evaluation today patient's bilateral lower extremities actually appear to be shown signs of improvement which is good news. Fortunately there does not appear to be any signs of active infection I think the anabiotic is helping in this regard. Overall I'm very pleased with how she is progressing. 09/15/18 patient was actually seen in ER yesterday due to her legs as well unfortunately. She states she's been having a lot of pain and discomfort as well as a lot of drainage. Upon inspection today the patient does have a lot of swelling and drainage I feel like this is more related to lymphedema and poor fluid control than it is to infection based on what I'm seeing. The physician in the emergency department also doubted that the patient was having a significant infection nonetheless I see no evidence of infection obvious at this point although I do see evidence of poor fluid control. She still not using a compression pumps, she is not elevating due to her lift chair as well as her  hospital bed being broken, and she really is not keeping her legs up as much as they should be and also has been taking off her wraps. All this combined I think has led to poor fluid control and to be honest she may be somewhat volume overloaded in general as well. I recommend that she may need to contact your physician to see if a  prescription for a diuretic would be beneficial in their opinion. As long as this is safe I think it would likely help her. 09/29/18 on evaluation today patient's left lower extremity actually appears to be doing quite a bit better. At least compared to last time that I saw her. She still has a large area where she is draining from but there's a lot of new skin speckled trout and in fact there's more new skin that there are open areas of weeping and drainage at this point. This is good news. With regard to the right lower extremity this is doing much better with the only open area that I really see being a dry spot on the right lateral ankle currently. Fortunately there's no signs of active infection at this time which is good news. No fevers, chills, nausea, or vomiting noted at this time. The patient seems somewhat stressed and overwhelmed during the visit today she was very lethargic as such. She does and she is not taking any pain medications at this point. Apparently according to her husband are also in the process of moving which is probably taking its toll on her as well. 10/06/18 on evaluation today patient appears to be doing rather well in regard to her lower extremities compared to last evaluation. Fortunately there's no signs of active infection. She tells me she did have an appointment with her primary. Nonetheless he was concerned that the wounds were somewhat deep based on pictures but we never actually saw her legs. She states that he had her somewhat worried due to the fact that she was fearing now that she was [place] have to have an amputation. With that being  said based on what I'm seeing check she looks better this week that she has the last two times I've seen her with much less drainage I'm actually pleased in this regard. That doesn't mean that she's out of the water but again I do not think what the point of talking about education at all in regard to her leg. She is very happy to hear this. She is also not having as much pain as she was having last week. 10/13/18 unfortunately on evaluation today patient still continues to have a significant amount of drainage she's not letting home health actually apply the compression dressings at this point. She's trying to use of Juxta-Lite of the top of Kerlex and the second layer of the three layer compression wrap. With that being said she just does not seem to be making as good a progress as I would expect if she was having the compression applied and in place on a regular basis. No fevers, chills, nausea, or vomiting noted at this time. 10/20/18 on evaluation today patient appears to be doing a little better in regard to her bilateral lower extremity ulcers. In fact the right lower extremity seems to be healed she doesn't even have any openings at this point left lower extremity though still somewhat macerated seems to be showing signs of new skin growth at multiple locations throughout. Fortunately there's no evidence of active infection at this time. No fevers, chills, nausea, or vomiting noted at this time. 10/27/18 on evaluation today patient appears to be doing much better in regard to her left lower Trinity ulcer. She's been tolerating the laptop complication and has minimal drainage noted at this point. Fortunately there's no signs of active infection at this time. No fevers, chills, nausea, or vomiting noted at this time. 11/03/18 on evaluation today  patient actually appears to be doing excellent in regard to her left lower extremity. She is having very little drainage at this point there does not appear  to be any significant signs of infection overall very pleased with how things have gone. She is likewise extremely pleased still and seems to be making wonderful progress week to week. I do believe antibiotics were helpful for her. Her primary care provider did place on amateur clean since I last saw her. 11/17/18 on evaluation today patient appears to be doing worse in regard to her bilateral lower extremities at this point. She is been tolerating the dressing changes without complication. With that being said she typically takes the Coban off fairly quickly upon arriving home even after being seen here in the clinic and does not allow home health reapply command as part of the dressing at home. Therefore she said no compression essentially since I last saw her as best I can tell. With that being said I think it shows and how much swelling she has in the open wounds that are noted at this point. Fortunately there's no signs of infection but unfortunately if she doesn't get this under control I think she will end up with infection and more significant issues. 11/24/18 on evaluation today patient actually appears to be doing somewhat better in regard to her bilateral lower extremities. 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 Nonetheless that is doing better currently so she should be able to attempt reinitiating the lymphedema pumps at this time. No fevers, chills, nausea, or vomiting noted at this time. 12/01/18 upon evaluation today patient's left lower extremity appears to be doing quite well unfortunately her right lower extremity is not doing nearly as well. She has been tolerating the dressing changes without complication unfortunately she did not keep a wrap on the right at this time. Nonetheless I believe this has led to increased swelling and weeping in  the world is actually much larger than during the last evaluation with her. 12/08/18 on evaluation today patient appears to be doing about the same at this point in regard to her right lower extremity. There is some more palatable to touch I'm concerned about the possibility of there being some infection although I think the main issue is she's not keeping her compression wrap on which in turn is not allowing this area to heal appropriately. 12/22/18 on evaluation today patient appears to be doing better in regard to left lower extremity unfortunately significantly worse in regard to the right lower extremity. The areas of blistering and necrotic superficial tissue have spread and again this does not really appear to be signs of infection and all she just doesn't seem to be doing nearly as well is what she has been in the past. Overall I feel like the Augmentin did absolutely nothing for her she doesn't seem to have any infection again I really didn't think so last time either is more of a potential preventative measure and hoping that this would make some difference but I think the main issue is she's not wearing her compression. She tells me she cannot wear the [place] been we put on she takes it off pretty much upon getting home. Subsequently she worshiped Juxta-Lite when I questioned her about how often she wears it this is no  more than three hours a day obviously that leaves 21 hours that she has no compression and this is obviously not doing well for her. Overall I'm concerned that if things continue to worsen she is at great risk of both infection as well as losing her leg. 01/05/19 on evaluation today patient appears to be doing well in regard to her left lower extremity which he is allowing [place] to wrap and not so well with regard to her right lower extremity which she is not allowing [place] to really wrap and keep the wrap on. She states that it hurts too badly whenever it's wrapped and she ends up  having to take it off. She's been using the Juxta-Lite she tells me up to six hours a day although I question whether or not that's really been the case to be honest. Previously she told me three hours today nonetheless obviously the legs as long as the wrap is doing great when she is not is doing much more poorly. 01/12/2019 on evaluation today patient actually appears to be doing a little better in my opinion with regard to her right lower extremity ulcer. She has a small open area on the left lower extremity unfortunately but again this I think is part of the normal fluctuation of what she is going to have to expect with regard to her legs especially when she is not using her lymphedema pumps on a regular basis. Subsequently based on what I am seeing today I think that she does seem to be doing slightly better with regard to her right lower extremity she did see her primary care provider on Monday they felt she had an infection and placed her on 2 antibiotics. Both Cipro and clindamycin. Subsequently again she seems possibly to be doing a little bit better in regards to the right lower extremity she also tells me however she has been wearing the compression wrap over the past week since I spoke with her as well that is a Kerlix and Coban wrap on the right. No fevers, chills, nausea, vomiting, or diarrhea. 01/19/2019 on evaluation today patient appears to be doing better with regard to her bilateral lower extremities especially the right. I feel like the compression has been beneficial for her which is great news. She did get a call from her primary care provider on her way here today telling her that she did have methicillin- resistant Staphylococcus aureus and he was calling in a couple new antibiotics for her including a ointment to be applied she tells me 3 times a day. With that being said this sounds like likely to be Bactroban which I think could be applied with each dressing/wrap change but I  would not be able to accommodate her applying this 3 times a day. She is in agreement with the least doing this we will add that to her orders today. 01/26/2019 on evaluation today patient actually appears to be doing much better with regard to her right lower extremity. Her left lower extremity is also doing quite well all things considering. Fortunately there is no evidence of active infection at this time. No fevers, chills, nausea, vomiting, or diarrhea. 02/02/2019 on evaluation today patient appears to be doing much better compared to her last evaluation. Little by little off like her right leg is returning more towards normal. There does not appear to be any signs of active infection and overall she seems to be doing quite well which is great news. I am very pleased  in this regard. No fevers, chills, nausea, vomiting, or diarrhea. 02/09/2019 upon evaluation today patient appears to be doing better with regard to her bilateral lower extremities. She has been tolerating the dressing changes without complication. Fortunately there is no signs of active infection at this time. No fevers, chills, nausea, vomiting, or diarrhea. 02/23/2019 on evaluation today patient actually appears to be doing quite well with regard to her bilateral lower extremities. She has been tolerating the dressing changes without complication. She is even used her pumps one time and states that she really felt like it felt good. With that being said she seems to be in good spirits and her legs appear to be doing excellent. 03/09/2019 on evaluation today patient appears to be doing well with regard to her right lower extremity there are no open wounds at this time she is having some discomfort but I feel like this is more neuropathy than anything. With regard to her left lower extremity she had several areas scattered around that she does have some weeping and drainage from but again overall she does not appear to be having any  significant issues and no evidence of infection at this time which is good news. 03/23/2019 on evaluation today patient appears to be doing well with regard to her right lower extremity which she tells me is still close she is using her juxta light here. Her left lower extremity she mainly just has an area on the foot which is still slightly draining although this also is doing great. Overall very pleased at this time. 04/06/2019 patient appears to be doing a little bit worse in regard to her left lower extremity upon evaluation today. She feels like this could be becoming infected again which she had issues with previous. Fortunately there is no signs of systemic infection but again this is always a struggle with her with her legs she will go from doing well to not so well in a very short amount of time. 04/20/2019 on evaluation today patient actually appears to be doing quite well with regard to her right lower extremity I do not see any signs of active infection at this time. Fortunately there is no fever chills noted. She is still taking the antibiotics which I prescribed for her at this point. In regard to the left lower extremity I do feel like some of these areas are better although again she still is having weeping from several locations at this time. 04/27/2019 on evaluation today patient appears to be doing about the same if not slightly worse in regard to her left lower extremity ulcers. She tells me when questioned that she has been sleeping in her Hoveround chair in fact she tells me she falls asleep without even knowing it. I think she is spending a whole lot of time in the chair and less time walking and moving around which is not good for her legs either. On top of that she is in a seated position which is also the worst position she is not really elevating her legs and she is also not using her lymphedema pumps. All this is good to contribute to worsening of her condition in  general. 05/18/2019 on evaluation today patient appears to be doing well with regard to her lower extremity on the right in fact this is showing no signs of any open wounds at this time. On the left she is continuing to have issues with areas that do drain. Some of the regions have healed and  there are couple areas that have reopened. She did go to the ER per the patient according to recommendations from the home health nurse due to what she was seen when she came out on 05/13/2019. Subsequently she felt like the patient needed to go to the hospital due to the fact that again she was having "milky white discharge" from her leg. Nonetheless she had and then was placed on doxycycline and subsequently seems to be doing better. 06/01/2019 upon evaluation today patient appears to be doing really in my opinion about the same. I do not see any signs of active infection which is good news. Overall she still has wounds over the bilateral lower extremities she has reopened on the right but this appears to be more of a crack where there is weeping/edema coming from the region. I do not see any evidence of infection at either site based on what I visualized today. 07/13/2019 upon evaluation today patient appears to be doing a little worse compared to last time I saw her. She since has been in the hospital from 06/21/2019 through 06/29/2019. This was secondary to having Covid. During that time they did apply lotion to her legs which unfortunately has caused her to develop a myriad of open wounds on her lower extremities. Her legs do appear to be doing better as far as the overall appearance is concerned but nonetheless she does have more open and weeping areas. 07/27/2019 upon evaluation today patient appears to be doing more poorly to be honest in regard to her left lower extremity in particular. There is no signs of systemic infection although I do believe she may have local infection. She notes she has been having  a lot of blue/green drainage which is consistent potentially with Pseudomonas. That may be something that we need to consider here as well. The doxycycline does not seem to have been helping. 08/03/2019 upon evaluation today patient appears to be doing a little better in my opinion compared to last week's evaluation. Her culture I did review today and she is on appropriate medications to help treat the Enterobacter that was noted. Overall I feel like that is good news. With that being said she is unfortunately continuing to have a lot of drainage and though it is doing better I still think she has a long ways to go to get things dried up in general. Fortunately there is no signs of systemic infection. 08/10/2019 upon evaluation today patient appears to be doing may be slightly better in regard to her left lower extremity the right lower extremity is doing much better. Fortunately there is no signs of infection right now which is good news. No fevers, chills, nausea, vomiting, or diarrhea. 08/24/2019 on evaluation today patient appears to be doing slightly better in regard to her lower extremities. The left lower extremity seems to be healed the right lower extremity is doing better though not completely healed as far as the openings are concerned. She has some generalized issues here with edema and weeping secondary to her lymphedema though again I do believe this is little bit drier compared to prior weeks evaluations. In general I am very pleased with how things seem to be progressing. No fevers, chills, nausea, vomiting, or diarrhea. 08/31/2019 upon evaluation today patient actually seems to making some progress here with regard to the left lower extremity in particular. She has been tolerating the dressing changes without complication. Fortunately there is no signs of active infection at this time. No fevers,  chills, nausea, vomiting, or diarrhea. She did see Dr. Doren Custard and he did note that she did have  a issue with the left great saphenous vein and the small saphenous vein in the leg. With that being said he was concerned about the possibility of laser ablation not being extremely successful. He also mentioned a small risk of DVT associated with the procedure. However if the wounds do not continue to improve he stated that that would probably be the way to go. Fortunately the patient's legs do seem to be doing much better. 09/07/2019 upon evaluation today patient appears to be doing better with regard to her lower extremities. She has been tolerating the dressing changes without complication. With that being said she is showing signs of improvement and overall very pleased. There are some areas on her leg that I think we do need to debride we discussed this last week the patient is in agreement with doing that as long as it does not hurt too badly. 09/14/2019 upon evaluation today patient appears to be doing decently well with regard to her left lower extremity. She is not having near as much weeping as she has had in the past things seem to be drying up which is good news. There is no signs of active infection at this time. 09/21/19 upon evaluation today patient appears to be doing better in regard overall to her bilateral lower extremities. She again has less open than she did previous and each week I feel like this is getting better. Fortunately there is no signs of active infection at this time. No fevers, chills, nausea, vomiting, or diarrhea. 09/28/2019 upon evaluation today patient actually appears to be showing signs of improvement with regard to her left lower extremity. Unfortunately the right medial lower extremity around the ankle region has reopened to some degree but this appears to be minimal still which is good news. There is no signs of active infection at this time which is also good news. 10/12/2019 upon evaluation today patient appears to be doing okay with regard to her bilateral  lower extremities today. The right is a little bit worse then last evaluation 2 weeks ago. The left is actually doing a little better in my opinion. Overall there is no signs of active infection at this time that I see. Obviously that something we have to keep a close eye on she is very prone to this with the significant and multiple openings that she has over the bilateral lower extremities. 10/19/2019 upon evaluation today patient appears to be doing about the best that I have seen her in quite some time. She has been tolerating the dressing changes without complication. There does not appear to be any signs of active infection and overall I am extremely happy with the way her legs appeared. She is drying up quite nicely and overall is having less pain. 11/09/2019 upon evaluation today patient appears to be doing better in regard to her wounds. She seems to be drying up more and more each time I see her this is just taking a very long time. Fortunately there is no signs of active infection at this time. 11/23/2019 upon evaluation today patient actually appears to be doing excellent in regard to her lower extremities at this point compared to where she has been. Fortunately there is no signs of active infection at this time. She did go to the hospital last week for nausea and vomiting completely unrelated to her wounds. Fortunately she is doing better  she was given some Reglan and got better. She had associated abdominal pain but they never found out what was going on. 12/07/2019 upon evaluation today patient appears to be doing well for the most part in regard to her legs. She unfortunately has not been keeping the Coban portion of her wraps on therefore the compression has not really been sufficient for what it is supposed to be. Nonetheless she tells me that it just hurt too bad therefore she removed it. 12/21/2019 upon evaluation today patient actually appears to be doing quite well with regard to her  legs. I do feel like she has been making progress which is great news and overall there is no signs of active infection at this time. No fevers, chills, nausea, vomiting, or diarrhea. 01/04/2020 upon evaluation today patient presents for follow-up concerning her lower extremity edema bilaterally. She still has open wounds she has not been using her lymphedema pumps. She is also not been utilizing her compression wraps appropriately she tends to unwrap them, take them off, or states that they hurt. Obviously the reason they hurt is because her legs start to swell but the issue is if she would use her compression/lymphedema pumps regularly she would not swell and she would have the pain. Nonetheless she has not even picked them up once honestly over the past several months and may be even as much as in the past year based on my opinion and what have seen. She tells me today that after last week when I talked about this with her specifically actually that was 2 weeks ago that she "forgot". 8//21 on evaluation today patient appears to be doing a little better in regard to her legs bilaterally. Fortunately there is no signs of active infection at this time. She tells me that she used her lymphedema pumps all of one time over the past 2 weeks since I last saw her. She tells me that she has been too busy in order to continue to use these. 02/01/2020 on evaluation today patient appears to be doing some better in regard to her wounds in general in her legs. We felt the right was healed although is not completely it does appear to be doing better she tells me she has been using her lymphedema pumps that she has had this six times since I last saw her. Obviously the more she does that the better she would do my opinion 02/15/2020 upon evaluation today patient appears to be doing about the same in regard to her legs. She tells me that she is pumping I'm still not sure how much she does to be perfectly honest. However  even if she does a little bit here and there I guess that is better than nothing. Fortunately there is no sign of active infection at this time which is great news. No fevers, chills, nausea, vomiting, or diarrhea. 02/29/2020 on evaluation today patient actually appears to be doing quite well all things considered this week. She has been tolerating the dressing changes without complication. Fortunately there is no signs of active infection at this time. No fevers, chills, nausea, vomiting, or diarrhea. 03/14/2020 upon evaluation today patient appears to be doing really about the same in regard to her legs. There is no signs of improvement overall and she as noted from home health does not appear to be elevating her legs he can get into her lift chair. There is too much stuff piled up on it the patient tells me. She also  tells me she cannot really use her pumps effectively due to the fact that she cannot have any space to get them on. Finally she is also not really elevating her legs because she is not sleeping in her bed she is sleeping in her chair currently and again overall I think everything that she is done in combination has been exactly the wrong thing for what she needs for her legs. 04/04/2020 upon evaluation today patient appears to be doing well at this time with regard to her legs. She is actually been pumping, keeping her wraps on, and to be honest she seems to be doing dramatically better the right leg is excellent the left leg is also excellent and measuring much smaller than previous. 04/18/2020 upon evaluation today patient actually is continue to make good progress in regard to her lower extremities bilaterally. Everything is improving and less wet that has been in the past overall I am extremely pleased with where things stand and I think that she is making great progress. The patient tells me she still continue to use her compression pumps 05/02/2020 on evaluation today patient appears  to be doing well at this time in regard to her left leg which is showing signs of drying up. With that being said she does have a lot of lymphedema type crusty skin around the toes of her left foot and the right medial ankle which has opened at this point. Fortunately there is no signs of active infection systemically at this point or even locally for that matter. 05/23/2020 on evaluation today patient appears to be doing well with regard to her lower extremities. Fortunately there is no signs of active infection at this time. No fever chills noted. She has been very depressed however she tells me. 06/06/2020 patient came in today for evaluation in regard to her bilateral lower extremity ulcerations. With that being said she came in feeling okay and actually laughing and joking around with the staff checking her in. Subsequently however she had a coughing spell and following the coughing spell it was a dramatic conversion from being jovial and joking around to being extremely short of breath her vital signs actually dropped in regard to her blood pressure from around 175 to down around 433 for systolic and from around 96 diastolic down to around 70. With that being said she also accompanied this with an increase in her respiratory rate which was also quite significant. Nonetheless I actually upon going into see her was extremely worried we called EMS to have her transported to the ER for further evaluation and treatment. She continued until EMS got here to be extremely short of breath even on 6 L of oxygen. Her oxygen saturation did come up to 99% but overall it was only 95 before which was not terrible she did feel like the oxygen helped her feel somewhat better however. She has a history of asthma but again even the coughing spell really should not have done this degree of alteration in her demeanor from where she was just before to after the coughing spell. She tells [place] however she has been feeling  somewhat abnormal since Friday although she could not really pinpoint exactly what was going on. 06/27/2020 plan evaluation today patient appears to be doing okay in regard to her leg ulcers. She is really not showing a lot of improvement to be honest and she still has a lot of weeping. With that being said after I last saw her we called  EMS to take her to the hospital she actually did not go she went to see her primary care provider who according to the patient have not identified and read through the note states that everything checked out okay. Nonetheless the patient has continued to have bouts where she gets very short of breath for seemingly no reason whatsoever. That happened even the same night that I saw her last time. This was after she refused transport to the hospital. With that being said she also tells me that just getting dressed to come here is quite a chore which is putting on her shirt causing her to have to stop to catch her breath. Obviously this is not normal and I feel like there is something going on. She may need a referral to be seen by cardiology ASAP. She does see her primary care provider early next week she tells me 07/11/2020 upon evaluation today patient's wounds again appear to be doing about the same she mainly has weeping of the bilateral lower extremities both are very swollen today she tells me she has been using her pumps but last time I saw her she told me she did not even have her pumps out that she had "packed them away because they had too much stuff in their house. Apparently her husband has been bringing stuff in from storage units. She then subsequently told me that she had so much stuff in her house that she has been staying up all night and did not go to bed till 7:00 this morning because she was cleaning up stuff. Nonetheless I am still unsure as to whether or not she is using the pumps based on what I am seeing I would think probably not. Nonetheless she  has been keeping the compression wraps in place that is at least something 07/25/2020 upon evaluation today patient appears to be doing well currently in regard to her legs all things considered. I do not think she is doing any worse significantly although honestly I do not think she is doing a lot better either. There does not appear to be any signs of infection which is good news although she does have a lot of weeping. She is using her pumps she tells me sporadically though she says that her husband is not a big fan of helping her to get the [place]. She also tells me is difficult because in their house. It would be easiest for her to use these as where her son is actually staying with her and living out at this point. Obviously I understand that she is having a lot of issues here and to be honest I do not really know what to say in that regard and the fact that I really cannot change the situations but I do believe she really needs to be using the lymphedema pumps to try to keep things under control here. Outside of that I think she is going to continue to have significant issues. I think the pumps coupled with good compression and elevation are the only things that are to help her at this point. 08/08/2020 upon evaluation today patient appears to be doing a little worse in regard to her legs in general. I think this may be due to some infection currently. With that being said I am going to go ahead and likely see about putting her on an antibiotic. Were also can obtain a culture today where she had some purulent drainage. 08/29/2020 upon evaluation today patient appears to  be doing about the same in regard to her bilateral lower extremities. Unfortunately she is continuing to have significant issues here with edema and she has significant lymphedema. With that being said she is really not doing anything that she is supposed to be doing as far as elevation, compression, using lymphedema pumps or  anything really for that matter. She does not use her pumps, is taken the wraps off shortly after having them put on as far as home health is concerned at least by the next day she tells me, and overall is not really elevating her legs she is telling me she has a lot to do as far as cleaning up in her house. Nonetheless this triad of noncompliance is leading to worsening not improving in regard to her legs. I had a very strong conversation with her about this today again. 09/12/2020 on evaluation today patient appears to be doing poorly with regard to her lower extremities bilaterally. Fortunately there does not appear to be any signs of active infection at this point. That is systemically. Locally there is some signs of pus coming from an area on her left leg. She has been on antibiotics recently though she is done with those currently. Nonetheless she tells me she has been having increased pain with the right leg this is extremely swollen as well. Fortunately there does not appear to be significant infection of the right leg though I think her pain is really as result of the increased swelling she has been declining the wrap on the right leg and the wounds here are getting significantly worse week by week. This obviously is not will be want to see. I discussed with the patient however if she does not use the wraps, use her pumps, and elevate her legs that she is not to get better. She has been sleeping in her motorized Hoveround which does not even allow her to elevate her legs at all. She tells me that she is also constantly "cleaning up as her husband told her that he wants to move back to [place]." Unfortunately this has been the narrative for the past several months in fact that she tells me that she has been having to clean up mass that was moved in from storage units into their home and that she does not really sleep and she never really elevates her legs. I explained to the patient today  that if she is not can I do any of this that there is really nothing that I can do to help her. Electronic Signature(s) Signed: 09/12/2020 9:53:38 AM By: [name] PA-C Previous Signature: 09/12/2020 9:53:23 AM Version By: [name] PA-C Entered By: [name] on 09/12/2020 09:53:38 -------------------------------------------------------------------------------- Physical Exam Details Patient Name: Date of Service: [name] 09/12/2020 8:30 A M Medical Record Number: [id number] Patient Account Number: [id number] Date of Birth/Sex: Treating RN: [birth year] (70 y.o. [name] Primary Care Provider: [name] Other Clinician: Referring Provider: Treating Provider/Extender: Darlen Round in Treatment: 162 Constitutional Obese and well-hydrated in no acute distress. Respiratory normal breathing without difficulty. Psychiatric this patient is able to make decisions and demonstrates good insight into disease process. Alert and Oriented x 3. pleasant and cooperative. Notes Upon inspection patient's wound bed showed signs of becoming more significant as far as the overall wound status on the right lower extremity. Again this is not good this is the backwards track from what we want to see everything I  talked to her about last time as far as what she needed to do to help her self to take care of her legs she is doing the opposite. She is not using her pumps, she is not elevating her legs, she stand in her motorized wheelchair all day with her feet towards the ground, and she is not even allowing home health but the wrap on her right leg which would help control some of the edema. Electronic Signature(s) Signed: 09/12/2020 9:54:13 AM By: [name] PA-C Entered By: [name] on 09/12/2020 09:54:12 -------------------------------------------------------------------------------- Physician Orders Details Patient Name: Date of  Service: [name]. 09/12/2020 8:30 A M Medical Record Number: [id number] Patient Account Number: [id number] Date of Birth/Sex: Treating RN: 05/02/1951 (70 y.o. [name] Primary Care Provider: [name] Other Clinician: Referring Provider: Treating Provider/Extender: Darlen Round in Treatment: [phone] Verbal / Phone Orders: No Diagnosis Coding ICD-10 Coding Code Description E11.622 Type 2 diabetes mellitus with other skin ulcer I89.0 Lymphedema, not elsewhere classified I87.331 Chronic venous hypertension (idiopathic) with ulcer and inflammation of right lower extremity I87.332 Chronic venous hypertension (idiopathic) with ulcer and inflammation of left lower extremity L97.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 leg with fat layer exposed L97.822 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 Bathing/ Shower/ Hygiene May shower and wash wound with soap and water. - with dressing changes, wash both legs with wash cloth and soap and water with dressing changes Edema Control - Lymphedema / SCD / Other Bilateral Lower Extremities Lymphedema Pumps. Use Lymphedema pumps on leg(s) 2-3 times a day for 45-60 minutes. If wearing any wraps or hose, do not remove them. Continue exercising as instructed. Elevate legs to the level of the heart or above for 30 minutes daily and/or when sitting, a frequency of: - especially at night Avoid standing for long periods of time. Exercise regularly [place] wound care orders this week; continue Home Health for wound care. May utilize formulary equivalent dressing for wound  treatment orders unless otherwise specified. Other Home Health Orders/Instructions: - [facility] Wound #61 - Lower Leg Wound Laterality: Left, Circumferential Peri-Wound Care: Zinc Oxide Ointment 30g tube 2 x Per Week/30 Days Discharge Instructions: Apply Zinc Oxiide to weeping areas with each dressing change Peri-Wound Care: Sween Lotion (Moisturizing lotion) (Home Health) 2 x Per Week/30 Days Discharge Instructions: Apply moisturizing lotion as directed Prim Dressing: KerraCel Ag Gelling Fiber Dressing, 4x5 in (silver alginate) (Home Health) 2 x Per Week/30 Days ary Discharge Instructions: Apply silver alginate to wound bed as instructed Secondary Dressing: Zetuvit Plus 4x4 in 2 x Per Week/30 Days Discharge Instructions: or equivalent extra absorbent pad.Apply over primary dressing as directed. Compression Wrap: ThreePress (3 layer compression wrap) (Home Health) 2 x Per Week/30 Days Discharge Instructions: Apply three layer compression as directed. Pad bend of ankle with foam or ABD pad. Wound #64 - Foot Wound Laterality: Dorsal, Left Peri-Wound Care: Zinc Oxide Ointment 30g tube 2 x Per Week/30 Days Discharge Instructions: Apply Zinc Oxiide to weeping areas with each dressing change Peri-Wound Care: Sween Lotion (Moisturizing lotion) (Home Health) 2 x Per Week/30 Days Discharge Instructions: Apply moisturizing lotion as directed Prim Dressing:  KerraCel Ag Gelling Fiber Dressing, 4x5 in (silver alginate) (Home Health) 2 x Per Week/30 Days ary Discharge Instructions: Apply silver alginate to wound bed as instructed Secondary Dressing: Zetuvit Plus 4x4 in 2 x Per Week/30 Days Discharge Instructions: or equivalent extra absorbent pad.Apply over primary dressing as directed. Compression Wrap: ThreePress (3 layer compression wrap) (Home Health) 2 x Per Week/30 Days Discharge Instructions: Apply three layer compression as directed. Pad bend of ankle with foam or ABD pad. Wound  #67 - Lower Leg Wound Laterality: Right, Medial Peri-Wound Care: Zinc Oxide Ointment 30g tube 2 x Per Week/30 Days Discharge Instructions: Apply Zinc Oxiide to weeping areas with each dressing change Peri-Wound Care: Sween Lotion (Moisturizing lotion) (Home Health) 2 x Per Week/30 Days Discharge Instructions: Apply moisturizing lotion as directed Prim Dressing: KerraCel Ag Gelling Fiber Dressing, 4x5 in (silver alginate) (Home Health) 2 x Per Week/30 Days ary Discharge Instructions: Apply silver alginate to wound bed as instructed Secondary Dressing: Zetuvit Plus 4x4 in 2 x Per Week/30 Days Discharge Instructions: or equivalent extra absorbent pad.Apply over primary dressing as directed. Compression Wrap: Kerlix Roll 4.5x3.1 (in/yd) (Home Health) 2 x Per Week/30 Days Discharge Instructions: Apply Kerlix and Coban compression as directed. Compression Wrap: Coban Self-Adherent Wrap 4x5 (in/yd) (Home Health) 2 x Per Week/30 Days Discharge Instructions: Apply over Kerlix as directed. Wound #69 - Foot Wound Laterality: Dorsal, Right Peri-Wound Care: Zinc Oxide Ointment 30g tube 2 x Per Week/30 Days Discharge Instructions: Apply Zinc Oxiide to weeping areas with each dressing change Peri-Wound Care: Sween Lotion (Moisturizing lotion) (Home Health) 2 x Per Week/30 Days Discharge Instructions: Apply moisturizing lotion as directed Prim Dressing: KerraCel Ag Gelling Fiber Dressing, 4x5 in (silver alginate) (Home Health) 2 x Per Week/30 Days ary Discharge Instructions: Apply silver alginate to wound bed as instructed Secondary Dressing: Zetuvit Plus 4x4 in 2 x Per Week/30 Days Discharge Instructions: or equivalent extra absorbent pad.Apply over primary dressing as directed. Compression Wrap: Kerlix Roll 4.5x3.1 (in/yd) (Home Health) 2 x Per Week/30 Days Discharge Instructions: Apply Kerlix and Coban compression as directed. Compression Wrap: Coban Self-Adherent Wrap 4x5 (in/yd) (Home Health) 2 x Per  Week/30 Days Discharge Instructions: Apply over Kerlix as directed. Wound #70 - Lower Leg Wound Laterality: Right, Anterior Peri-Wound Care: Zinc Oxide Ointment 30g tube 2 x Per Week/30 Days Discharge Instructions: Apply Zinc Oxiide to weeping areas with each dressing change Peri-Wound Care: Sween Lotion (Moisturizing lotion) (Home Health) 2 x Per Week/30 Days Discharge Instructions: Apply moisturizing lotion as directed Prim Dressing: [employer] Gelling Fiber Dressing, 4x5 in (silver alginate) (Home Health) 2 x Per Week/30 Days ary Discharge Instructions: Apply silver alginate to wound bed as instructed Secondary Dressing: Zetuvit Plus 4x4 in 2 x Per Week/30 Days Discharge Instructions: or equivalent extra absorbent pad.Apply over primary dressing as directed. Compression Wrap: Kerlix Roll 4.5x3.1 (in/yd) (Home Health) 2 x Per Week/30 Days Discharge Instructions: Apply Kerlix and Coban compression as directed. Compression Wrap: Coban Self-Adherent Wrap 4x5 (in/yd) (Home Health) 2 x Per Week/30 Days Discharge Instructions: Apply over Kerlix as directed. Laboratory naerobe culture (MICRO) - left lower leg Bacteria identified in Unspecified specimen by A LOINC Code: 403-4 Convenience Name: Anerobic culture Electronic Signature(s) Signed: 09/12/2020 5:20:30 PM By: [name] PA-C Signed: 09/12/2020 5:50:20 PM By: [name] RN, BSN Entered By: [name] on 09/12/2020 09:52:38 -------------------------------------------------------------------------------- Problem List Details Patient Name: Date of Service: [name]. 09/12/2020 8:30 A M Medical Record Number: [id number] Patient Account Number: [id number] Date  of Birth/Sex: Treating RN: [birth year] (70 y.o. Martyn Malay, Vaughan Basta Primary Care Provider: [name] Other Clinician: Referring Provider: Treating Provider/Extender: Darlen Round in Treatment: [phone] Active  Problems ICD-10 Encounter Code Description Active Date MDM Diagnosis E11.622 Type 2 diabetes mellitus with other skin ulcer 08/05/2017 No Yes I89.0 Lymphedema, not elsewhere classified 08/05/2017 No Yes I87.331 Chronic venous hypertension (idiopathic) with ulcer and inflammation of right 08/05/2017 No Yes lower extremity I87.332 Chronic venous hypertension (idiopathic) with ulcer and inflammation of left 08/05/2017 No Yes lower extremity L97.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 leg with fat layer 08/05/2017 No Yes exposed L97.822 Non-pressure chronic ulcer of other part of left lower leg with fat layer exposed2/20/2019 No Yes L97.522 Non-pressure chronic ulcer of other part of left foot with fat layer exposed 06/01/2019 No Yes I10 Essential (primary) hypertension 08/05/2017 No Yes E66.01 Morbid (severe) obesity due to excess calories 08/05/2017 No Yes F41.8 Other specified anxiety disorders 08/05/2017 No Yes R53.1 Weakness 08/05/2017 No Yes Inactive Problems Resolved Problems Electronic Signature(s) Signed: 09/12/2020 9:21:14 AM By: [name] PA-C Entered By: [name] on 09/12/2020 09:21:14 -------------------------------------------------------------------------------- Progress Note Details Patient Name: Date of Service: [name]. 09/12/2020 8:30 A M Medical Record Number: [id number] Patient Account Number: [id number] Date of Birth/Sex: Treating RN: 1950-09-30 (70 y.o. [name] Primary Care Provider: [name] Other Clinician: Referring Provider: Treating Provider/Extender: Darlen Round in Treatment: 162 Subjective Chief Complaint Information obtained from Patient Bilateral reoccurring LE ulcers History of Present Illness (HPI) this patient has been seen a couple of times before and returns with recurrent problems to her right and left lower extremity with swelling and weeping ulcerations due to not wearing  her compression stockings which she had been advised to do during her last discharge, at the end of June 2018. During her last visit the patient had had normal arterial blood flow and her venous reflux study did not necessitate any surgical intervention. She was recommended compression and elevation and wound care. After prolonged treatment the patient was completely healed but she has been noncompliant with wearing or compressions.. She was here last week with an outpatient return visit planned but the patient came in a very poor general condition with altered mental status and was rushed to the ER on my request. With a history of hypertension, diabetes, TIA and right-sided weakness she was set up for an MRI on her brain and cervical spine and was sent to [facility]. Getting an MRI done was very difficult but once the workup was done she was found not to have any spinal stenosis, epidural abscess or hematoma or discitis. This was radiculopathy to be treated as an outpatient and she was given a follow-up appointment. Today she is feeling much better alert and oriented and has come to reevaluate her bilateral lower extremity lymphedema and ulceration 03/25/2017 -- she was admitted to the hospital on 03/16/2017 and discharged on 03/18/2017 with left leg cellulitis and ulceration. She was started on vancomycin and Zosyn and x-ray showed no bony involvement. She was treated for a cellulitis with IV antibiotics changed to Rocephin and Flagyl and was discharged on oral Keflex and doxycycline to complete a 7 day course. Last hemoglobin A1c was 7.1 and her other ailments including hypertension got asthma were appropriately treated. 05/06/2017 -- she is awaiting the right size of compression stockings from [place] but other than that has been doing well. ====== Old notes  70 year old patient was seen one time last October and was lost to follow-up. She has recurrent problems with weeping and ulceration of her  left lower extremity and has swelling of this for several years. It has been worse for the last 2 months. Past medical history is significant for diabetes mellitus type 2, hypertension, gout, morbid obesity, depressive disorders, hiatal hernia, migraines, status post knee surgery, risk of a cholecystectomy, vaginal hysterectomy and breast biopsy. She is not a smoker. As noted before she has never had a venous duplex study and an arterial ABI study was attempted but the left lower extremity was noncompressible 10/01/2016 -- had a lower extremity venous duplex reflux evaluation which showed no evidence of deep vein reflux in the right or left lower extremity, and no evidence of great saphenous vein reflux more than 500 ms in the right or left lower extremity, and the left small saphenous vein is incompetent but no vascular consult was recommended. review of her electronic medical records noted that the ABI was checked in July 2017 where the right ABI was normal limits and the left ABI could not be ascertained due to pain with cuff pressure but the waveforms are within normal limits. her arterial duplex study scheduled for April 27. 10/08/2016 -- the patient has various reasons for not having a compression on and for the last 3 days she has had no compression on her left lower extremity either due to pain or the lack of nursing help. She does not use her juxta lites either. 10/15/2016 -- the patient did not keep her appointment for arterial duplex study on April 27 and I have asked her to reschedule this. Her pain is out of proportion with the physical findings and she continuously fails to wear a compression wraps and cuts them off because she says she cannot tolerate the pain. She does not use her juxta lites either. 10/22/2016 -- he has rescheduled her arterial duplex study to May 21 and her pain today is a bit better. She has not been wearing her juxta lites on her right lower extremity but now  understands that she needs to do this. She did tolerate the to press compression wrap on her left lower extremity 10/29/2016 --arterial duplex study is scheduled for next week and overall she has been tolerating her compression wraps and also using her juxta lites on her right lower extremity 11/05/2016 -- the right ABI was 0.95 the left was 1.03. The digit TBI is on the right was 0.83 on the left was 0.92 and she had biphasic flow through these vessels. The impression was that of normal lower extremity arterial study. 11/12/2016 -- her pain is minimal and she is doing very well overall. 11/26/2016 -- she has got juxta lites and her insurance will not pay for additional dual layer compression stockings. She is going to order some from [place]. 05/12/2017 -- her juxta lites are very old and too big for her and these have not been helping with compression. She did get 20-30 mm compression stockings from [facility] but she and her husband are unable to put these on. I believe she will benefit from bilateral Extremit-ease, compression stockings and we will measure her for these today. 05/20/2017 -- lymphedema on the left lower extremity has increased a lot and she has a open ulceration as a result of this. The right lower extremity is looking pretty good. She has decided to by the compression stockings herself and will get reimbursed by the home health,  at a later date. 05/27/2017 -- her sciatica is bothering her a lot and she thought her left leg pain was caused due to the compression wrap and hence removed it and has significant lymphedema. There is no inflammation on this left lower extremity. 06/17/17 on evaluation today patient appears to be doing very well and in fact is completely healed in regard to her ulcerations. Unfortunately however she does have continued issues with lymphedema nonetheless. We did order compression garments for her unfortunately she states that the size that she received were  large although we ordered medium. Obviously this means she is not getting the optimal compression. She does not have those with her today and therefore we could not confirm and contact the company on her behalf. Nonetheless she does state that she is going to have her husband bring them by tomorrow so that we can verify and then get in touch with the company. No fevers, chills, nausea, or vomiting noted at this time. Overall patient is doing better otherwise and I'm pleased with the progress she has made. 07/01/17 on evaluation today patient appears to be doing very well in regard to her bilateral lower extremity she does not have any openings at this point which is excellent news. Overall I'm pleased with how things have progressed up to this time. Since she is doing so well we did order her compression which we are seeing her today to ensure that it fits her properly and everything is doing well in that regard and then subsequently she will be discharged. ============ Old Notes: 03/31/16 patient presents today for evaluation concerning open wounds that she has over the left medial ankle region as well as the left dorsal foot. She has previously had this occur although it has been healed for a number of months after having this for about a year prior until her hospitalization on 01/05/16. At that point in time it appears that she was admitted to the hospital for left lower extremity cellulitis and was placed on vancomycin and Zosyn at that point. Eventually upon discharge on January 15, 2016 she was placed on doxycycline at that point in time. Later on 03/27/16 positive wound culture growing Escherichia coli this was switched to amoxicillin. Currently she tells me that she is having pain radiated to be a 7 out of 10 which can be as high as 10 out of 10 with palpation and manipulation of the wound. This wound appears to be mainly venous in nature due to the bilateral lower extremity venous  stasis/lymphedema. This is definitely much worse on her left than the right side. She does have type 1 diabetes mellitus, hypertension, morbid obesity, and is wheelchair dependent.during the course of the hospital stay a blood culture was also obtained and fortunately appeared negative. She also had an x-ray of the tibia/fibula on the left which showed no acute bone abnormality. Her white blood cell count which was performed last on 03/25/16 was 7.3, hemoglobin 12.8, protein 7.1, albumin 3.0. Her urine culture appeared to be negative for any specific organisms. Patient did have a left lower extremity venous duplex evaluation for DVT . This did not include venous reflux studies but fortunately was negative for DVT Patient also had arterial studies performed which revealed that she had a . normal ABI on the right though this was unable to be performed on the left secondary to pain that she was having around the ankle region due to the wound. However it was stated on report that  she had biphasic pulses and apparently good blood flow. ========== 06/03/17 she is here in follow-up evaluation for right lower extremity ulcer. The right lower sure he has healed but she has reopened to the left medial malleolus and dorsal foot with weeping. She is waiting for new compression garments to arrive from home health, the previous compression garments were ill fitting. We will continue with compression bilaterally and follow-up in 2 weeks Readmission: 08/05/17 on evaluation today patient appears to be doing somewhat poorly in regard to her left lower extremity especially although the right lower extremity has a small area which may no longer be open. She has been having a lot of drainage from the left lower extremity however he tells me that she has not been able to use the EXTREMIT-EASE Compression at this point. She states that she did better and was able to actually apply the Juxta-Lite compression although  the wound that she has is too large and therefore really does not compress which is why she cannot wear it at this point. She has no one who can help her put it on regular basis her son can sometimes but he's not able to do it most of the time. I do believe that's why she has begun to weave and have issues as she is currently yet again. No fevers, chills, nausea, or vomiting noted at this time. Patient is no evidence of dementia. 08/12/17 on evaluation today patient appears to still be doing fairly well in regard to the draining areas/weeping areas at this point. With that being said she unfortunately did go to the ER yesterday due to what was felt to be possibly a cellulitis. They place her on doxycycline by mouth and discharge her home. She definitely was not admitted. With that being said she states she has had more discomfort which has been unusual for her even compared to prior times and she's had infections.08/12/17 on evaluation today patient appears to still be doing fairly well in regard to the draining areas/weeping areas at this point. With that being said she unfortunately did go to the ER yesterday due to what was felt to be possibly a cellulitis. They place her on doxycycline by mouth and discharge her home. She definitely was not admitted. With that being said she states she has had more discomfort which has been unusual for her even compared to prior times and she's had infections. 08/19/17 put evaluation today patient tells me that she's been having a lot of what sounds to be neuropathic type pain in regard to her left lower extremity. She has been using over-the-counter topical bins again which some believe. That in order to apply the she actually remove the wrap we put on her last Wednesday on Thursday. Subsequently she has not had anything on compression wise since that time. The good news is a lot of the weeping areas appear to have closed at this point again I believe she would do  better with compression but we are struggling to get her to actually use what she needs to at this point. No fevers, chills, nausea, or vomiting noted at this time. 09/03/17 on evaluation today patient appears to be doing okay in regard to her lower extremities in regard to the lymphedema and weeping. Fortunately she does not seem to show any signs of infection at this point she does have a little bit of weeping occurring in the right medial malleolus area. With that being said this does not appear to be  too significant which is good news. 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 She has severe skin damage secondary to both of these features involving the dorsal left foot and medial left ankle and lower leg. Still has open areas in the left anterior foot. The area on the right closed over. She uses her own juxta light stockings. She does not have an arterial issue 09/16/17 on evaluation today patient actually appears to be doing excellent in regard to her bilateral lower extremity swelling. The Juxta-Lite compression wrap seem to be doing very well for her. She has not however been using the portion that goes over her foot. Her left foot still is draining a little bit not nearly as significant as it has been in the past but still I do believe that she likely needs to utilize the full wrap including the foot portion of this will improve as well. She also has been apparently putting on a significant amount of Vaseline which also think is not helpful for her. I recommended that if she feels she needs something for moisturizer Eucerin will probably be better. 09/30/17 on evaluation today patient presents with several new open areas in regard to her left lower extremity although these appear to be minimal and mainly seem to be more moisture breakdown than anything. Fortunately she does not seem to have any evidence of infection which is  great news. She has been tolerating the dressing changes without complication we are using silver alginate on the foot she has been using AB pads to have the legs and using her Juxta- Lite compression which seems to be controlling her swelling very well. Overall I'm pleased with the poor way she has progressed. 10/14/17 on evaluation today patient appears to be doing better in regard to her left lower extremity areas of weeping. She does still have some discomfort although in general this does not appear to be as macerated and I think it is progressing nicely. I do think she still needs to wear the foot portion of her Juxta- Lite in order to get the most benefit from the wrap obviously. She states she understands. Fortunately there does not appear to be evidence of infection at this time which is great news. 10/28/17 on evaluation today patient appears to be doing excellent in regard to her left lower extremity. She has just a couple areas that are still open and seem to be causing any trouble whatsoever. For that reason I think that she is definitely headed in the right direction the spots are very tiny compared to what we have been dealing with in the past. 11/11/17 on evaluation today patient appears to have a right lateral lower extremity ulcer that has opened since I last saw her. She states this is where the home health nurse that was coming out remove the dressing without wetting the alginate first. Nonetheless I do not know if this is indeed the case or not but more importantly we have not ordered home help to be coming out for her wounds at all. I'm unsure as to why they are coming out and we're gonna have to check on this and get things situated in that regard. With that being said we currently really do not need them to be coming out as the patient has been taking care of her leg herself without complication and no issues. In fact she was doing much better prior to nursing coming out. 11/25/17  on  evaluation today patient actually appears to be doing fairly well in regard to her left lower extremity swelling. In fact she has very little area of weeping at this point there's just a small spot on the lateral portion of her right leg that still has me just a little bit more concerned as far as wanting to see this clear up before I discharge her to caring for this at home. Nonetheless overall she has made excellent progress. 12/09/17 on evaluation today patient appears to be doing rather well in regard to her lower extremity edema. She does have some weeping still in the left lower extremity although the big area we were taking care of two weeks ago actually has closed and she has another area of weeping on the left lower extremity immediately as well is the top of her foot. She does not currently have lymphedema pumps she has been wearing her compression daily on a regular basis as directed. With that being said I think she may benefit from lymphedema pumps. 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 We had a very difficult time getting and keeping this under control. 12/23/17 on evaluation today patient actually appears to be doing a little bit more poorly in regard to her bilateral lower extremities. She has been tolerating the Juxta-Lite compression wraps. Unfortunately she has two new ulcers on the right lower extremity and left lower Trinity ulceration seems to be larger. Obviously this is not good news. She has been tolerating the dressings without complication. 12/30/17 on evaluation today patient actually appears to be doing much better in regard to her bilateral lower extremity edema. She continues to have some issues with ulcerations and in fact there appears to be one spot on each leg where the wrap may have caused a little bit of a  blister which is subsequently opened up at this point is given her pain. Fortunately it does not appear to be any evidence of infection which is good news. No fevers chills noted. 01/13/18 on evaluation today patient appears to be doing rather well in regard to her bilateral lower extremities. The dressings did get kind of stuck as far as the wound beds are concerned but again I think this is mainly due to the fact that she actually seems to be showing signs of healing which is good news. She's not having as much drainage therefore she was having more of the dressing sticking. Nonetheless overall I feel like her swelling is dramatically down compared to previous. 01/20/18 on evaluation today patient unfortunately though she's doing better in most regards has a large blister on the left anterior lower extremity where she is draining quite significantly. Subsequently this is going to need debridement today in order to see what's underneath and ensure she does not continue to trapping fluid at this location. Nonetheless No fevers, chills, nausea, or vomiting noted at this time. 01/27/18 on evaluation today patient appears to be doing rather well at this point in regard to her right lower extremity there's just a very small area that she still has open at this point. With that being said I do believe that she is tolerating the compression wraps very well in making good progress. Home health is coming out at this point to see her. Her left lower extremity on the lateral portion is actually what still  mainly open and causing her some discomfort for the most part 02/10/18 on evaluation today patient actually appears to be doing very well in regard to her right lower extremity were all the ulcers appear to be completely close. In regard to the left lower extremity she does have two areas still open and some leaking from the dorsal surface of her foot but this still seems to be doing much better to me in  general. 02/24/18 on evaluation today patient actually appears to be doing much better in regard to her right lower extremity this is still completely healed. Her left lower extremity is also doing much better fortunately she has no evidence of infection. The one area that is gonna require some debridement is still on the left anterior shin. Fortunately this is not hurting her as badly today. 03/10/18 on evaluation today patient appears to be doing better in some regards although she has a little bit more open area on the dorsal foot and she also has some issues on the medial portion of the left lower extremity which is actually new and somewhat deep. With that being said there fortunately does not appear to be any significant signs of infection which is good news. No fevers, chills, nausea, or vomiting noted at this time. In general her swelling seems to be doing fairly well which is good news. 03/31/18 on evaluation today patient presents for follow-up concerning her left lower extremity lymphedema. Unfortunately she has been doing a little bit more poorly since I last saw her in regard to the amount of weeping that she is experiencing. She's also having some increased pain in the anterior shin location. Unfortunately I do not feel like the patient is making such good progress at this point a few weeks back she was definitely doing much better. 04/07/18 on evaluation today patient actually appears to be showing some signs of improvement as far as the left lower extremity is concerned. She has been tolerating the dressing changes and it does appear that the Drawtex did better for her. With that being said unfortunately home health is stating that they cannot obtain the Drawtex going forward. Nonetheless we're gonna have to check and see what they may be able to get the alginate they were using was getting stuck in causing new areas of skin being pulled all that with and subsequently weep and calls her  to worsen overall this is the first time we've seen improvement at this time. 04/14/18 on evaluation today patient actually appears to be doing rather well at this point there does not appear to be any evidence of infection at this time and she is actually doing excellent in regard to the weeping in fact she almost has no openings remaining even compared to just last week this is a dramatic improvement. No fevers chills noted 04/21/18 evaluation today patient actually appears to be doing very well. She in fact is has a small area on the posterior lower extremity location and she has a small area on the dorsal surface of her foot that are still open both of which are very close to closing. We're hoping this will be close shortly. She brought her Juxta-Lite wrap with her today hoping that would be able to put her in it unfortunately I don't think were quite at that point yet but we're getting closer. 04/28/18 upon evaluation today patient actually appears to be doing excellent in regard to her left lower extremity ulcer. In fact the region on the  posterior lower extremity actually is much smaller than previously noted. Overall I'm very happy with the progress she has made. She again did bring her Juxta-Lite although we're not quite ready for that yet. 05/11/18 upon evaluation today patient actually appears to be doing in general fairly well in regard to her left lower Trinity. The swelling is very well controlled. With that being said she has a new area on the left anterior lower extremity as well as between the first and second toes of her left foot that was not present during the last evaluation. The region of her posterior left lower extremity actually appears to be almost completely healed. T be honest I'm very pleased with o the way that stands. Nonetheless I do believe that the lotion may be keeping the area to moist as far as her legs are concerned subsequently I'm gonna consider discontinuing  that today. 05/26/18 on evaluation today patient appears to be doing rather well in regard to her left lower should be ulcers. In fact everything appears to be close except for a very small area on the left posterior lower extremity. Fortunately there does not appear to be any evidence of infection at this time. Overall very pleased with her progress. 06/02/18 and evaluation today patient actually appears to be doing very well in regard to her lower extremity ulcers. She has one small area that still continues to weep that I think may benefit her being able to justify lotion and user Juxta-Lite wraps versus continued to wrap her. Nonetheless I think this is something we can definitely look into at this point. 06/23/18 on evaluation today patient unfortunately has openings of her bilateral lower extremities. In general she seems to be doing much worse than when I last saw her just as far as her overall health standpoint is concerned. She states that her discomfort is mainly due to neuropathy she's not having any other issues otherwise. No fevers, chills, nausea, or vomiting noted at this time. 06/30/18 on evaluation today patient actually appears to be doing a little worse in regard to her right lower extremity her left lower extremity of doing fairly well. Fortunately there is no sign of infection at this time. She has been tolerating the dressing changes without complication. Home health did not come out like they were supposed to for the appropriate wrap changes. They stated that they never received the orders from [place] which were fax. Nonetheless we will send a copy of the orders with the patient today as well. 07/07/18 on evaluation today patient appears to be doing much better in regard to lower extremities. She still has several openings bilaterally although since I last saw her her legs did show obvious signs of infection when she later saw her nurse. Subsequently a culture was obtained and she is  been placed on Bactrim and Keflex. Fortunately things seem to be looking much better it does appear she likely had an infection. Again last week we'd even discussed it but again there really was not any obvious sign that she had infection therefore we held off on the antibiotics. Nonetheless I'm glad she's doing better today. 07/14/18 on evaluation today patient appears to be doing much better regarding her bilateral lower Trinity's. In fact on the right lower for me there's nothing open at this point there are some dry skin areas at the sites where she had infection. Fortunately there is no evidence of systemic infection which is excellent news. No fevers chills noted 07/21/18 on evaluation  today patient actually appears to be doing much better in regard to her left lower extremity ulcers. She is making good progress and overall I feel like she's improving each time I see her. She's having no pain I do feel like the infection is completely resolved which is excellent news. No fevers, chills, nausea, or vomiting noted at this time. 07/28/18 on evaluation today patient appears to be doing very well in regard to her left lower [employer]. Everything seems to be showing signs of improvement which is excellent news. Overall very pleased with the progress that has been made. Fortunately there's no evidence of active infection at this time also excellent news. 08/04/18 on evaluation today patient appears to be doing more poorly in regard to her bilateral lower extremities. She has two new areas open up on the right and these were completely closed as of last week. She still has the two spots on the left which in my pinion seem to be doing better. Fortunately there's no evidence of infection again at this point. 08/11/18 on evaluation today patient actually appears to be doing very well in regard to her bilateral lower Trinity wounds that all seem to be doing better and are measures smaller today.  Fortunately there's no signs of infection. No fevers, chills, nausea, or vomiting noted at this time. 08/18/18 on evaluation today patient actually appears to be doing about the same inverter bilateral lower extremities. She continues to have areas that blistering open as was drain that fortunately nothing too significant. Overall I feel like Drawtex may have done better for her however compared to the collagen. 08/25/18 on evaluation today patient appears to be doing a little bit more poorly today even compared to last time I saw her. Again I'm not exactly sure why she's making worse progress over the past several weeks. I'm beginning to wonder if there is some kind of underlying low level infection causing this issue. I did actually take a culture from the left anterior lower extremity but it was a new wound draining quite a bit at this point. Unfortunately she also seems to be having more pain which is what also makes me worried about the possibility of infection. This is despite never erythema noted at this point. 09/01/18 on evaluation today patient actually appears to be doing a little worse even compared to last week in regard to bilateral lower extremities. She did go to the hospital on the 16th was given a dose of IV Zosyn and then discharged with a recommendation to continue with the Bactrim that I previously prescribed for her. Nonetheless she is still having a lot of discomfort she tells me as well at this time. This is definitely unfortunate. No fevers, chills, nausea, or vomiting noted at this time. 09/08/18 on evaluation today patient's bilateral lower extremities actually appear to be shown signs of improvement which is good news. Fortunately there does not appear to be any signs of active infection I think the anabiotic is helping in this regard. Overall I'm very pleased with how she is progressing. 09/15/18 patient was actually seen in ER yesterday due to her legs as well unfortunately. She  states she's been having a lot of pain and discomfort as well as a lot of drainage. Upon inspection today the patient does have a lot of swelling and drainage I feel like this is more related to lymphedema and poor fluid control than it is to infection based on what I'm seeing. The physician in the  emergency department also doubted that the patient was having a significant infection nonetheless I see no evidence of infection obvious at this point although I do see evidence of poor fluid control. She still not using a compression pumps, she is not elevating due to her lift chair as well as her hospital bed being broken, and she really is not keeping her legs up as much as they should be and also has been taking off her wraps. All this combined I think has led to poor fluid control and to be honest she may be somewhat volume overloaded in general as well. I recommend that she may need to contact your physician to see if a prescription for a diuretic would be beneficial in their opinion. As long as this is safe I think it would likely help her. 09/29/18 on evaluation today patient's left lower extremity actually appears to be doing quite a bit better. At least compared to last time that I saw her. She still has a large area where she is draining from but there's a lot of new skin speckled trout and in fact there's more new skin that there are open areas of weeping and drainage at this point. This is good news. With regard to the right lower extremity this is doing much better with the only open area that I really see being a dry spot on the right lateral ankle currently. Fortunately there's no signs of active infection at this time which is good news. No fevers, chills, nausea, or vomiting noted at this time. The patient seems somewhat stressed and overwhelmed during the visit today she was very lethargic as such. She does and she is not taking any pain medications at this point. Apparently according to her  husband are also in the process of moving which is probably taking its toll on her as well. 10/06/18 on evaluation today patient appears to be doing rather well in regard to her lower extremities compared to last evaluation. Fortunately there's no signs of active infection. She tells me she did have an appointment with her primary. Nonetheless he was concerned that the wounds were somewhat deep based on pictures but we never actually saw her legs. She states that he had her somewhat worried due to the fact that she was fearing now that she was [place] have to have an amputation. With that being said based on what I'm seeing check she looks better this week that she has the last two times I've seen her with much less drainage I'm actually pleased in this regard. That doesn't mean that she's out of the water but again I do not think what the point of talking about education at all in regard to her leg. She is very happy to hear this. She is also not having as much pain as she was having last week. 10/13/18 unfortunately on evaluation today patient still continues to have a significant amount of drainage she's not letting home health actually apply the compression dressings at this point. She's trying to use of Juxta-Lite of the top of Kerlex and the second layer of the three layer compression wrap. With that being said she just does not seem to be making as good a progress as I would expect if she was having the compression applied and in place on a regular basis. No fevers, chills, nausea, or vomiting noted at this time. 10/20/18 on evaluation today patient appears to be doing a little better in regard to her bilateral lower  extremity ulcers. In fact the right lower extremity seems to be healed she doesn't even have any openings at this point left lower extremity though still somewhat macerated seems to be showing signs of new skin growth at multiple locations throughout. Fortunately there's no evidence of  active infection at this time. No fevers, chills, nausea, or vomiting noted at this time. 10/27/18 on evaluation today patient appears to be doing much better in regard to her left lower Trinity ulcer. She's been tolerating the laptop complication and has minimal drainage noted at this point. Fortunately there's no signs of active infection at this time. No fevers, chills, nausea, or vomiting noted at this time. 11/03/18 on evaluation today patient actually appears to be doing excellent in regard to her left lower extremity. She is having very little drainage at this point there does not appear to be any significant signs of infection overall very pleased with how things have gone. She is likewise extremely pleased still and seems to be making wonderful progress week to week. I do believe antibiotics were helpful for her. Her primary care provider did place on amateur clean since I last saw her. 11/17/18 on evaluation today patient appears to be doing worse in regard to her bilateral lower extremities at this point. She is been tolerating the dressing changes without complication. With that being said she typically takes the Coban off fairly quickly upon arriving home even after being seen here in the clinic and does not allow home health reapply command as part of the dressing at home. Therefore she said no compression essentially since I last saw her as best I can tell. With that being said I think it shows and how much swelling she has in the open wounds that are noted at this point. Fortunately there's no signs of infection but unfortunately if she doesn't get this under control I think she will end up with infection and more significant issues. 11/24/18 on evaluation today patient actually appears to be doing somewhat better in regard to her bilateral lower extremities. 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 Nonetheless that is doing better currently so she should be able to attempt reinitiating the lymphedema pumps at this time. No fevers, chills, nausea, or vomiting noted at this time. 12/01/18 upon evaluation today patient's left lower extremity appears to be doing quite well unfortunately her right lower extremity is not doing nearly as well. She has been tolerating the dressing changes without complication unfortunately she did not keep a wrap on the right at this time. Nonetheless I believe this has led to increased swelling and weeping in the world is actually much larger than during the last evaluation with her. 12/08/18 on evaluation today patient appears to be doing about the same at this point in regard to her right lower extremity. There is some more palatable to touch I'm concerned about the possibility of there being some infection although I think the main issue is she's not keeping her compression wrap on which in turn is not allowing this area to heal appropriately. 12/22/18 on evaluation today patient appears to be doing better in regard to left lower extremity unfortunately significantly worse in regard to the right lower extremity. The areas of blistering and necrotic superficial tissue have spread and again this does not really  appear to be signs of infection and all she just doesn't seem to be doing nearly as well is what she has been in the past. Overall I feel like the Augmentin did absolutely nothing for her she doesn't seem to have any infection again I really didn't think so last time either is more of a potential preventative measure and hoping that this would make some difference but I think the main issue is she's not wearing her compression. She tells me she cannot wear the [place] been we put on she takes it off pretty much upon getting home. Subsequently she worshiped Juxta-Lite when I questioned her about how often she  wears it this is no more than three hours a day obviously that leaves 21 hours that she has no compression and this is obviously not doing well for her. Overall I'm concerned that if things continue to worsen she is at great risk of both infection as well as losing her leg. 01/05/19 on evaluation today patient appears to be doing well in regard to her left lower extremity which he is allowing [place] to wrap and not so well with regard to her right lower extremity which she is not allowing [place] to really wrap and keep the wrap on. She states that it hurts too badly whenever it's wrapped and she ends up having to take it off. She's been using the Juxta-Lite she tells me up to six hours a day although I question whether or not that's really been the case to be honest. Previously she told me three hours today nonetheless obviously the legs as long as the wrap is doing great when she is not is doing much more poorly. 01/12/2019 on evaluation today patient actually appears to be doing a little better in my opinion with regard to her right lower extremity ulcer. She has a small open area on the left lower extremity unfortunately but again this I think is part of the normal fluctuation of what she is going to have to expect with regard to her legs especially when she is not using her lymphedema pumps on a regular basis. Subsequently based on what I am seeing today I think that she does seem to be doing slightly better with regard to her right lower extremity she did see her primary care provider on Monday they felt she had an infection and placed her on 2 antibiotics. Both Cipro and clindamycin. Subsequently again she seems possibly to be doing a little bit better in regards to the right lower extremity she also tells me however she has been wearing the compression wrap over the past week since I spoke with her as well that is a Kerlix and Coban wrap on the right. No fevers, chills, nausea, vomiting, or  diarrhea. 01/19/2019 on evaluation today patient appears to be doing better with regard to her bilateral lower extremities especially the right. I feel like the compression has been beneficial for her which is great news. She did get a call from her primary care provider on her way here today telling her that she did have methicillin- resistant Staphylococcus aureus and he was calling in a couple new antibiotics for her including a ointment to be applied she tells me 3 times a day. With that being said this sounds like likely to be Bactroban which I think could be applied with each dressing/wrap change but I would not be able to accommodate her applying this 3 times a day. She is in agreement with  the least doing this we will add that to her orders today. 01/26/2019 on evaluation today patient actually appears to be doing much better with regard to her right lower extremity. Her left lower extremity is also doing quite well all things considering. Fortunately there is no evidence of active infection at this time. No fevers, chills, nausea, vomiting, or diarrhea. 02/02/2019 on evaluation today patient appears to be doing much better compared to her last evaluation. Little by little off like her right leg is returning more towards normal. There does not appear to be any signs of active infection and overall she seems to be doing quite well which is great news. I am very pleased in this regard. No fevers, chills, nausea, vomiting, or diarrhea. 02/09/2019 upon evaluation today patient appears to be doing better with regard to her bilateral lower extremities. She has been tolerating the dressing changes without complication. Fortunately there is no signs of active infection at this time. No fevers, chills, nausea, vomiting, or diarrhea. 02/23/2019 on evaluation today patient actually appears to be doing quite well with regard to her bilateral lower extremities. She has been tolerating the dressing changes without  complication. She is even used her pumps one time and states that she really felt like it felt good. With that being said she seems to be in good spirits and her legs appear to be doing excellent. 03/09/2019 on evaluation today patient appears to be doing well with regard to her right lower extremity there are no open wounds at this time she is having some discomfort but I feel like this is more neuropathy than anything. With regard to her left lower extremity she had several areas scattered around that she does have some weeping and drainage from but again overall she does not appear to be having any significant issues and no evidence of infection at this time which is good news. 03/23/2019 on evaluation today patient appears to be doing well with regard to her right lower extremity which she tells me is still close she is using her juxta light here. Her left lower extremity she mainly just has an area on the foot which is still slightly draining although this also is doing great. Overall very pleased at this time. 04/06/2019 patient appears to be doing a little bit worse in regard to her left lower extremity upon evaluation today. She feels like this could be becoming infected again which she had issues with previous. Fortunately there is no signs of systemic infection but again this is always a struggle with her with her legs she will go from doing well to not so well in a very short amount of time. 04/20/2019 on evaluation today patient actually appears to be doing quite well with regard to her right lower extremity I do not see any signs of active infection at this time. Fortunately there is no fever chills noted. She is still taking the antibiotics which I prescribed for her at this point. In regard to the left lower extremity I do feel like some of these areas are better although again she still is having weeping from several locations at this time. 04/27/2019 on evaluation today patient appears  to be doing about the same if not slightly worse in regard to her left lower extremity ulcers. She tells me when questioned that she has been sleeping in her Hoveround chair in fact she tells me she falls asleep without even knowing it. I think she is spending a whole lot of  time in the chair and less time walking and moving around which is not good for her legs either. On top of that she is in a seated position which is also the worst position she is not really elevating her legs and she is also not using her lymphedema pumps. All this is good to contribute to worsening of her condition in general. 05/18/2019 on evaluation today patient appears to be doing well with regard to her lower extremity on the right in fact this is showing no signs of any open wounds at this time. On the left she is continuing to have issues with areas that do drain. Some of the regions have healed and there are couple areas that have reopened. She did go to the ER per the patient according to recommendations from the home health nurse due to what she was seen when she came out on 05/13/2019. Subsequently she felt like the patient needed to go to the hospital due to the fact that again she was having "milky white discharge" from her leg. Nonetheless she had and then was placed on doxycycline and subsequently seems to be doing better. 06/01/2019 upon evaluation today patient appears to be doing really in my opinion about the same. I do not see any signs of active infection which is good news. Overall she still has wounds over the bilateral lower extremities she has reopened on the right but this appears to be more of a crack where there is weeping/edema coming from the region. I do not see any evidence of infection at either site based on what I visualized today. 07/13/2019 upon evaluation today patient appears to be doing a little worse compared to last time I saw her. She since has been in the hospital from 06/21/2019 through  06/29/2019. This was secondary to having Covid. During that time they did apply lotion to her legs which unfortunately has caused her to develop a myriad of open wounds on her lower extremities. Her legs do appear to be doing better as far as the overall appearance is concerned but nonetheless she does have more open and weeping areas. 07/27/2019 upon evaluation today patient appears to be doing more poorly to be honest in regard to her left lower extremity in particular. There is no signs of systemic infection although I do believe she may have local infection. She notes she has been having a lot of blue/green drainage which is consistent potentially with Pseudomonas. That may be something that we need to consider here as well. The doxycycline does not seem to have been helping. 08/03/2019 upon evaluation today patient appears to be doing a little better in my opinion compared to last week's evaluation. Her culture I did review today and she is on appropriate medications to help treat the Enterobacter that was noted. Overall I feel like that is good news. With that being said she is unfortunately continuing to have a lot of drainage and though it is doing better I still think she has a long ways to go to get things dried up in general. Fortunately there is no signs of systemic infection. 08/10/2019 upon evaluation today patient appears to be doing may be slightly better in regard to her left lower extremity the right lower extremity is doing much better. Fortunately there is no signs of infection right now which is good news. No fevers, chills, nausea, vomiting, or diarrhea. 08/24/2019 on evaluation today patient appears to be doing slightly better in regard to her lower extremities.  The left lower extremity seems to be healed the right lower extremity is doing better though not completely healed as far as the openings are concerned. She has some generalized issues here with edema and weeping secondary to  her lymphedema though again I do believe this is little bit drier compared to prior weeks evaluations. In general I am very pleased with how things seem to be progressing. No fevers, chills, nausea, vomiting, or diarrhea. 08/31/2019 upon evaluation today patient actually seems to making some progress here with regard to the left lower extremity in particular. She has been tolerating the dressing changes without complication. Fortunately there is no signs of active infection at this time. No fevers, chills, nausea, vomiting, or diarrhea. She did see Dr. Doren Custard and he did note that she did have a issue with the left great saphenous vein and the small saphenous vein in the leg. With that being said he was concerned about the possibility of laser ablation not being extremely successful. He also mentioned a small risk of DVT associated with the procedure. However if the wounds do not continue to improve he stated that that would probably be the way to go. Fortunately the patient's legs do seem to be doing much better. 09/07/2019 upon evaluation today patient appears to be doing better with regard to her lower extremities. She has been tolerating the dressing changes without complication. With that being said she is showing signs of improvement and overall very pleased. There are some areas on her leg that I think we do need to debride we discussed this last week the patient is in agreement with doing that as long as it does not hurt too badly. 09/14/2019 upon evaluation today patient appears to be doing decently well with regard to her left lower extremity. She is not having near as much weeping as she has had in the past things seem to be drying up which is good news. There is no signs of active infection at this time. 09/21/19 upon evaluation today patient appears to be doing better in regard overall to her bilateral lower extremities. She again has less open than she did previous and each week I feel like this  is getting better. Fortunately there is no signs of active infection at this time. No fevers, chills, nausea, vomiting, or diarrhea. 09/28/2019 upon evaluation today patient actually appears to be showing signs of improvement with regard to her left lower extremity. Unfortunately the right medial lower extremity around the ankle region has reopened to some degree but this appears to be minimal still which is good news. There is no signs of active infection at this time which is also good news. 10/12/2019 upon evaluation today patient appears to be doing okay with regard to her bilateral lower extremities today. The right is a little bit worse then last evaluation 2 weeks ago. The left is actually doing a little better in my opinion. Overall there is no signs of active infection at this time that I see. Obviously that something we have to keep a close eye on she is very prone to this with the significant and multiple openings that she has over the bilateral lower extremities. 10/19/2019 upon evaluation today patient appears to be doing about the best that I have seen her in quite some time. She has been tolerating the dressing changes without complication. There does not appear to be any signs of active infection and overall I am extremely happy with the way her legs appeared. She  is drying up quite nicely and overall is having less pain. 11/09/2019 upon evaluation today patient appears to be doing better in regard to her wounds. She seems to be drying up more and more each time I see her this is just taking a very long time. Fortunately there is no signs of active infection at this time. 11/23/2019 upon evaluation today patient actually appears to be doing excellent in regard to her lower extremities at this point compared to where she has been. Fortunately there is no signs of active infection at this time. She did go to the hospital last week for nausea and vomiting completely unrelated to her  wounds. Fortunately she is doing better she was given some Reglan and got better. She had associated abdominal pain but they never found out what was going on. 12/07/2019 upon evaluation today patient appears to be doing well for the most part in regard to her legs. She unfortunately has not been keeping the [place] portion of her wraps on therefore the compression has not really been sufficient for what it is supposed to be. Nonetheless she tells me that it just hurt too bad therefore she removed it. 12/21/2019 upon evaluation today patient actually appears to be doing quite well with regard to her legs. I do feel like she has been making progress which is great news and overall there is no signs of active infection at this time. No fevers, chills, nausea, vomiting, or diarrhea. 01/04/2020 upon evaluation today patient presents for follow-up concerning her lower extremity edema bilaterally. She still has open wounds she has not been using her lymphedema pumps. She is also not been utilizing her compression wraps appropriately she tends to unwrap them, take them off, or states that they hurt. Obviously the reason they hurt is because her legs start to swell but the issue is if she would use her compression/lymphedema pumps regularly she would not swell and she would have the pain. Nonetheless she has not even picked them up once honestly over the past several months and may be even as much as in the past year based on my opinion and what have seen. She tells me today that after last week when I talked about this with her specifically actually that was 2 weeks ago that she "forgot". 8//21 on evaluation today patient appears to be doing a little better in regard to her legs bilaterally. Fortunately there is no signs of active infection at this time. She tells me that she used her lymphedema pumps all of one time over the past 2 weeks since I last saw her. She tells me that she has been too busy in order to  continue to use these. 02/01/2020 on evaluation today patient appears to be doing some better in regard to her wounds in general in her legs. We felt the right was healed although is not completely it does appear to be doing better she tells me she has been using her lymphedema pumps that she has had this six times since I last saw her. Obviously the more she does that the better she would do my opinion 02/15/2020 upon evaluation today patient appears to be doing about the same in regard to her legs. She tells me that she is pumping I'm still not sure how much she does to be perfectly honest. However even if she does a little bit here and there I guess that is better than nothing. Fortunately there is no sign of active infection at this  time which is great news. No fevers, chills, nausea, vomiting, or diarrhea. 02/29/2020 on evaluation today patient actually appears to be doing quite well all things considered this week. She has been tolerating the dressing changes without complication. Fortunately there is no signs of active infection at this time. No fevers, chills, nausea, vomiting, or diarrhea. 03/14/2020 upon evaluation today patient appears to be doing really about the same in regard to her legs. There is no signs of improvement overall and she as noted from home health does not appear to be elevating her legs he can get into her lift chair. There is too much stuff piled up on it the patient tells me. She also tells me she cannot really use her pumps effectively due to the fact that she cannot have any space to get them on. Finally she is also not really elevating her legs because she is not sleeping in her bed she is sleeping in her chair currently and again overall I think everything that she is done in combination has been exactly the wrong thing for what she needs for her legs. 04/04/2020 upon evaluation today patient appears to be doing well at this time with regard to her legs. She is actually  been pumping, keeping her wraps on, and to be honest she seems to be doing dramatically better the right leg is excellent the left leg is also excellent and measuring much smaller than previous. 04/18/2020 upon evaluation today patient actually is continue to make good progress in regard to her lower extremities bilaterally. Everything is improving and less wet that has been in the past overall I am extremely pleased with where things stand and I think that she is making great progress. The patient tells me she still continue to use her compression pumps 05/02/2020 on evaluation today patient appears to be doing well at this time in regard to her left leg which is showing signs of drying up. With that being said she does have a lot of lymphedema type crusty skin around the toes of her left foot and the right medial ankle which has opened at this point. Fortunately there is no signs of active infection systemically at this point or even locally for that matter. 05/23/2020 on evaluation today patient appears to be doing well with regard to her lower extremities. Fortunately there is no signs of active infection at this time. No fever chills noted. She has been very depressed however she tells me. 06/06/2020 patient came in today for evaluation in regard to her bilateral lower extremity ulcerations. With that being said she came in feeling okay and actually laughing and joking around with the staff checking her in. Subsequently however she had a coughing spell and following the coughing spell it was a dramatic conversion from being jovial and joking around to being extremely short of breath her vital signs actually dropped in regard to her blood pressure from around 175 to down around 157 for systolic and from around 96 diastolic down to around 70. With that being said she also accompanied this with an increase in her respiratory rate which was also quite significant. Nonetheless I actually upon going into  see her was extremely worried we called EMS to have her transported to the ER for further evaluation and treatment. She continued until EMS got here to be extremely short of breath even on 6 L of oxygen. Her oxygen saturation did come up to 99% but overall it was only 95 before which was not  terrible she did feel like the oxygen helped her feel somewhat better however. She has a history of asthma but again even the coughing spell really should not have done this degree of alteration in her demeanor from where she was just before to after the coughing spell. She tells [place] however she has been feeling somewhat abnormal since Friday although she could not really pinpoint exactly what was going on. 06/27/2020 plan evaluation today patient appears to be doing okay in regard to her leg ulcers. She is really not showing a lot of improvement to be honest and she still has a lot of weeping. With that being said after I last saw her we called EMS to take her to the hospital she actually did not go she went to see her primary care provider who according to the patient have not identified and read through the note states that everything checked out okay. Nonetheless the patient has continued to have bouts where she gets very short of breath for seemingly no reason whatsoever. That happened even the same night that I saw her last time. This was after she refused transport to the hospital. With that being said she also tells me that just getting dressed to come here is quite a chore which is putting on her shirt causing her to have to stop to catch her breath. Obviously this is not normal and I feel like there is something going on. She may need a referral to be seen by cardiology ASAP. She does see her primary care provider early next week she tells me 07/11/2020 upon evaluation today patient's wounds again appear to be doing about the same she mainly has weeping of the bilateral lower extremities both are very  swollen today she tells me she has been using her pumps but last time I saw her she told me she did not even have her pumps out that she had "packed them away because they had too much stuff in their house. Apparently her husband has been bringing stuff in from storage units. She then subsequently told me that she had so much stuff in her house that she has been staying up all night and did not go to bed till 7:00 this morning because she was cleaning up stuff. Nonetheless I am still unsure as to whether or not she is using the pumps based on what I am seeing I would think probably not. Nonetheless she has been keeping the compression wraps in place that is at least something 07/25/2020 upon evaluation today patient appears to be doing well currently in regard to her legs all things considered. I do not think she is doing any worse significantly although honestly I do not think she is doing a lot better either. There does not appear to be any signs of infection which is good news although she does have a lot of weeping. She is using her pumps she tells me sporadically though she says that her husband is not a big fan of helping her to get the [place]. She also tells me is difficult because in their house. It would be easiest for her to use these as where her son is actually staying with her and living out at this point. Obviously I understand that she is having a lot of issues here and to be honest I do not really know what to say in that regard and the fact that I really cannot change the situations but I do believe she really needs  to be using the lymphedema pumps to try to keep things under control here. Outside of that I think she is going to continue to have significant issues. I think the pumps coupled with good compression and elevation are the only things that are to help her at this point. 08/08/2020 upon evaluation today patient appears to be doing a little worse in regard to her legs in general.  I think this may be due to some infection currently. With that being said I am going to go ahead and likely see about putting her on an antibiotic. Were also can obtain a culture today where she had some purulent drainage. 08/29/2020 upon evaluation today patient appears to be doing about the same in regard to her bilateral lower extremities. Unfortunately she is continuing to have significant issues here with edema and she has significant lymphedema. With that being said she is really not doing anything that she is supposed to be doing as far as elevation, compression, using lymphedema pumps or anything really for that matter. She does not use her pumps, is taken the wraps off shortly after having them put on as far as home health is concerned at least by the next day she tells me, and overall is not really elevating her legs she is telling me she has a lot to do as far as cleaning up in her house. Nonetheless this triad of noncompliance is leading to worsening not improving in regard to her legs. I had a very strong conversation with her about this today again. 09/12/2020 on evaluation today patient appears to be doing poorly with regard to her lower extremities bilaterally. Fortunately there does not appear to be any signs of active infection at this point. That is systemically. Locally there is some signs of pus coming from an area on her left leg. She has been on antibiotics recently though she is done with those currently. Nonetheless she tells me she has been having increased pain with the right leg this is extremely swollen as well. Fortunately there does not appear to be significant infection of the right leg though I think her pain is really as result of the increased swelling she has been declining the wrap on the right leg and the wounds here are getting significantly worse week by week. This obviously is not will be want to see. I discussed with the patient however if she does not use the  wraps, use her pumps, and elevate her legs that she is not to get better. She has been sleeping in her motorized Hoveround which does not even allow her to elevate her legs at all. She tells me that she is also constantly "cleaning up as her husband told her that he wants to move back to [place]." Unfortunately this has been the narrative for the past several months in fact that she tells me that she has been having to clean up mass that was moved in from storage units into their home and that she does not really sleep and she never really elevates her legs. I explained to the patient today that if she is not can I do any of this that there is really nothing that I can do to help her. Objective Constitutional Obese and well-hydrated in no acute distress. Vitals Time Taken: 9:04 AM, Height: 62 in, Weight: 335 lbs, BMI: 61.3, Temperature: 97.7 F, Pulse: 97 bpm, Respiratory Rate: 18 breaths/min, Blood Pressure: 164/84 mmHg. Respiratory normal breathing without difficulty. Psychiatric this patient is  able to make decisions and demonstrates good insight into disease process. Alert and Oriented x 3. pleasant and cooperative. General Notes: Upon inspection patient's wound bed showed signs of becoming more significant as far as the overall wound status on the right lower extremity. Again this is not good this is the backwards track from what we want to see everything I talked to her about last time as far as what she needed to do to help her self to take care of her legs she is doing the opposite. She is not using her pumps, she is not elevating her legs, she stand in her motorized wheelchair all day with her feet towards the ground, and she is not even allowing home health but the wrap on her right leg which would help control some of the edema. Integumentary (Hair, Skin) Wound #61 status is Open. Original cause of wound was Gradually Appeared. The date acquired was: 04/20/2019. The wound has  been in treatment 73 weeks. The wound is located on the Left,Circumferential Lower Leg. The wound measures 8cm length x 20.5cm width x 0.2cm depth; 128.805cm^2 area and 25.761cm^3 volume. There is Fat Layer (Subcutaneous Tissue) exposed. There is no tunneling or undermining noted. There is a medium amount of serous drainage noted. The wound margin is indistinct and nonvisible. There is large (67-100%) pink, pale granulation within the wound bed. There is no necrotic tissue within the wound bed. Wound #64 status is Open. Original cause of wound was Gradually Appeared. The date acquired was: 06/01/2019. The wound has been in treatment 67 weeks. The wound is located on the Left,Dorsal Foot. The wound measures 6cm length x 4.5cm width x 0.1cm depth; 21.206cm^2 area and 2.121cm^3 volume. There is Fat Layer (Subcutaneous Tissue) exposed. There is no tunneling or undermining noted. There is a medium amount of serous drainage noted. The wound margin is flat and intact. There is large (67-100%) pink, pale granulation within the wound bed. There is no necrotic tissue within the wound bed. Wound #67 status is Open. Original cause of wound was Gradually Appeared. The date acquired was: 05/02/2020. The wound has been in treatment 19 weeks. The wound is located on the Right,Medial Lower Leg. The wound measures 2.5cm length x 3.5cm width x 0.1cm depth; 6.872cm^2 area and 0.687cm^3 volume. There is Fat Layer (Subcutaneous Tissue) exposed. There is no tunneling or undermining noted. There is a medium amount of serous drainage noted. The wound margin is thickened. There is large (67-100%) pink, pale granulation within the wound bed. There is no necrotic tissue within the wound bed. Wound #69 status is Open. Original cause of wound was Gradually Appeared. The date acquired was: 07/25/2020. The wound has been in treatment 7 weeks. The wound is located on the Right,Dorsal Foot. The wound measures 1.6cm length x 3cm width x  0.1cm depth; 3.77cm^2 area and 0.377cm^3 volume. There is Fat Layer (Subcutaneous Tissue) exposed. There is no tunneling or undermining noted. There is a medium amount of serosanguineous drainage noted. The wound margin is distinct with the outline attached to the wound base. There is large (67-100%) pink, pale granulation within the wound bed. There is no necrotic tissue within the wound bed. Wound #70 status is Open. Original cause of wound was Gradually Appeared. The date acquired was: 08/29/2020. The wound has been in treatment 2 weeks. The wound is located on the Right,Anterior Lower Leg. The wound measures 6cm length x 4.5cm width x 0.1cm depth; 21.206cm^2 area and 2.121cm^3 volume. There is Fat  Layer (Subcutaneous Tissue) exposed. There is no tunneling or undermining noted. There is a medium amount of serosanguineous drainage noted. The wound margin is flat and intact. There is large (67-100%) pink granulation within the wound bed. There is no necrotic tissue within the wound bed. 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 There was a Three Layer Compression Therapy Procedure by Fonnie Mu, RN. Post procedure Diagnosis Wound #61: Same as  Pre-Procedure Plan Follow-up Appointments: Return Appointment in 2 weeks. Bathing/ Shower/ Hygiene: May shower and wash wound with soap and water. - with dressing changes, wash both legs with wash cloth and soap and water with dressing changes Edema Control - Lymphedema / SCD / Other: Lymphedema Pumps. Use Lymphedema pumps on leg(s) 2-3 times a day for 45-60 minutes. If wearing any wraps or hose, do not remove them. Continue exercising as instructed. Elevate legs to the level of the heart or above for 30 minutes daily and/or when sitting, a frequency of: - especially at night Avoid standing for long periods of time. Exercise regularly Home Health: New wound care orders this week; continue Home Health for wound care. May utilize formulary equivalent dressing for wound treatment orders unless otherwise specified. Other Home Health Orders/Instructions: - [facility] ordered were: Anerobic culture - left lower leg WOUND #61: - Lower Leg Wound Laterality: Left, Circumferential Peri-Wound Care: Zinc Oxide Ointment 30g tube 2 x Per Week/30 Days Discharge Instructions: Apply Zinc Oxiide to weeping areas with each dressing change Peri-Wound Care: Sween Lotion (Moisturizing lotion) (Home Health) 2 x Per Week/30 Days Discharge Instructions: Apply moisturizing lotion as directed Prim Dressing: KerraCel Ag Gelling Fiber Dressing, 4x5 in (silver alginate) (Home Health) 2 x Per Week/30 Days ary Discharge Instructions: Apply silver alginate to wound bed as instructed Secondary Dressing: Zetuvit Plus 4x4 in 2 x Per Week/30 Days Discharge Instructions: or equivalent extra absorbent pad.Apply over primary dressing as directed. Com pression Wrap: ThreePress (3 layer compression wrap) (Home Health) 2 x Per Week/30 Days Discharge Instructions: Apply three layer compression as directed. Pad bend of ankle with foam or ABD pad. WOUND #64: - Foot Wound Laterality: Dorsal, Left Peri-Wound Care: Zinc  Oxide Ointment 30g tube 2 x Per Week/30 Days Discharge Instructions: Apply Zinc Oxiide to weeping areas with each dressing change Peri-Wound Care: Sween Lotion (Moisturizing lotion) (Home Health) 2 x Per Week/30 Days Discharge Instructions: Apply moisturizing lotion as directed Prim Dressing: KerraCel Ag Gelling Fiber Dressing, 4x5 in (silver alginate) (Home Health) 2 x Per Week/30 Days ary Discharge Instructions: Apply silver alginate to wound bed as instructed Secondary Dressing: Zetuvit Plus 4x4 in 2 x Per Week/30 Days Discharge Instructions: or equivalent extra absorbent pad.Apply over primary dressing as directed. Com pression Wrap: ThreePress (3 layer compression wrap) (Home Health) 2 x Per Week/30 Days Discharge Instructions: Apply three layer compression as directed. Pad bend of ankle with  foam or ABD pad. WOUND #67: - Lower Leg Wound Laterality: Right, Medial Peri-Wound Care: Zinc Oxide Ointment 30g tube 2 x Per Week/30 Days Discharge Instructions: Apply Zinc Oxiide to weeping areas with each dressing change Peri-Wound Care: Sween Lotion (Moisturizing lotion) (Home Health) 2 x Per Week/30 Days Discharge Instructions: Apply moisturizing lotion as directed Prim Dressing: KerraCel Ag Gelling Fiber Dressing, 4x5 in (silver alginate) (Home Health) 2 x Per Week/30 Days ary Discharge Instructions: Apply silver alginate to wound bed as instructed Secondary Dressing: Zetuvit Plus 4x4 in 2 x Per Week/30 Days Discharge Instructions: or equivalent extra absorbent pad.Apply over primary dressing as directed. Com pression Wrap: Kerlix Roll 4.5x3.1 (in/yd) (Home Health) 2 x Per Week/30 Days Discharge Instructions: Apply Kerlix and Coban compression as directed. Com pression Wrap: Coban Self-Adherent Wrap 4x5 (in/yd) (Home Health) 2 x Per Week/30 Days Discharge Instructions: Apply over Kerlix as directed. WOUND #69: - Foot Wound Laterality: Dorsal, Right Peri-Wound Care: Zinc Oxide Ointment 30g  tube 2 x Per Week/30 Days Discharge Instructions: Apply Zinc Oxiide to weeping areas with each dressing change Peri-Wound Care: Sween Lotion (Moisturizing lotion) (Home Health) 2 x Per Week/30 Days Discharge Instructions: Apply moisturizing lotion as directed Prim Dressing: KerraCel Ag Gelling Fiber Dressing, 4x5 in (silver alginate) (Home Health) 2 x Per Week/30 Days ary Discharge Instructions: Apply silver alginate to wound bed as instructed Secondary Dressing: Zetuvit Plus 4x4 in 2 x Per Week/30 Days Discharge Instructions: or equivalent extra absorbent pad.Apply over primary dressing as directed. Com pression Wrap: Kerlix Roll 4.5x3.1 (in/yd) (Home Health) 2 x Per Week/30 Days Discharge Instructions: Apply Kerlix and Coban compression as directed. Com pression Wrap: Coban Self-Adherent Wrap 4x5 (in/yd) (Home Health) 2 x Per Week/30 Days Discharge Instructions: Apply over Kerlix as directed. WOUND #70: - Lower Leg Wound Laterality: Right, Anterior Peri-Wound Care: Zinc Oxide Ointment 30g tube 2 x Per Week/30 Days Discharge Instructions: Apply Zinc Oxiide to weeping areas with each dressing change Peri-Wound Care: Sween Lotion (Moisturizing lotion) (Home Health) 2 x Per Week/30 Days Discharge Instructions: Apply moisturizing lotion as directed Prim Dressing: KerraCel Ag Gelling Fiber Dressing, 4x5 in (silver alginate) (Home Health) 2 x Per Week/30 Days ary Discharge Instructions: Apply silver alginate to wound bed as instructed Secondary Dressing: Zetuvit Plus 4x4 in 2 x Per Week/30 Days Discharge Instructions: or equivalent extra absorbent pad.Apply over primary dressing as directed. Com pression Wrap: Kerlix Roll 4.5x3.1 (in/yd) (Home Health) 2 x Per Week/30 Days Discharge Instructions: Apply Kerlix and Coban compression as directed. Com pression Wrap: Coban Self-Adherent Wrap 4x5 (in/yd) (Home Health) 2 x Per Week/30 Days Discharge Instructions: Apply over Kerlix as directed. 1. I  explained to the patient that if she was going to have any results and try to get this healed that it was going require her to do those 3 things that she is not doing which is elevate her legs, not sleeping stay in her motorized wheelchair all day, wear the compression wrap, and use her lymphedema pumps. If she is not doing any of those which currently she is not she is not can get better There is really no reason for me to continue to see her and tell her the same thing every week which she declines to do. The patient told me that she will do it and that she understood after this conversation today. However she is told me this before. We will see how things appear at the next visit. 2. With regard to treatment we are  can add silver alginate to the wound beds to try to help with some of the wound management. Subsequently we will also use the absorptive Lauraine Rinne is a 2 but dressings over top of this. 3. I am also can recommend a 3 layer compression wrap be continued on the left leg as this is doing okay on the right leg at work and use a Curlex and Coban wrap to try to help control some of the edema here. 4. With regard to infection there was actually a pus pocket and appeared to be a small abscess on the left leg that was noted today. I did obtain a wound culture and depending on the results of the culture we will treat her as necessary with antibiotics. We will see patient back for reevaluation in 1 week here in the clinic. If anything worsens or changes patient will contact our office for additional recommendations. I did advise the patient that if she felt like she was in too much pain the need to pain management she may need to talk to her primary care provider about a referral to pain management. Electronic Signature(s) Signed: 09/12/2020 9:56:05 AM By: [name] PA-C Entered By: [name] on 09/12/2020  09:56:04 -------------------------------------------------------------------------------- SuperBill Details Patient Name: Date of Service: [name], [name] 09/12/2020 Medical Record Number: [id number] Patient Account Number: [id number] Date of Birth/Sex: Treating RN: 01/18/1951 (70 y.o. 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 I10 Essential (primary) hypertension E66.01 Morbid (severe) obesity due to excess calories F41.8 Other specified anxiety disorders R53.1 Weakness Facility Procedures Physician Procedures : CPT4 Code Description Modifier [id number] - WC PHYS LEVEL 4 - EST PT ICD-10 Diagnosis Description E11.622 Type 2 diabetes mellitus with other skin ulcer I89.0 Lymphedema, not elsewhere classified I87.331 Chronic venous hypertension (idiopathic) with  ulcer and inflammation of right lower extremity I87.332 Chronic venous hypertension (idiopathic) with ulcer and inflammation of left lower extremity Quantity: 1 Electronic Signature(s) Signed: 09/12/2020 9:56:23 AM By: [name] PA-C Entered By: [name] on 09/12/2020 09:56:21

## 2020-09-13 NOTE — Progress Notes (Signed)
SHAWANNA, ZANDERS ([id number]) Visit Report for 09/12/2020 Arrival Information Details Patient Name: Date of Service: CHRISTABELLE, HANZLIK 09/12/2020 8:30 A M Medical Record Number: [id number] Patient Account Number: [id number] Date of Birth/Sex: Treating RN: 03/12/1951 (70 y.o. Martyn Malay, Linda Primary Care : [name] Other Clinician: Referring : Treating /Extender: Darlen Round in Treatment: 32 Visit Information History Since Last Visit Added or deleted any medications: No Patient Arrived: Wheel Chair Any new allergies or adverse reactions: No Arrival Time: 09:04 Had a fall or experienced change in No Accompanied By: husband activities of daily living that may affect Transfer Assistance: None risk of falls: Patient Identification Verified: Yes Signs or symptoms of abuse/neglect since last visito No Secondary Verification Process Completed: Yes Hospitalized since last visit: No Patient Requires Transmission-Based Precautions: No Implantable device outside of the clinic excluding No Patient Has Alerts: Yes cellular tissue based products placed in the center Patient Alerts: R ABI= 1.01 since last visit: L ABI = .99 Has Dressing in Place as Prescribed: Yes Pain Present Now: Yes Electronic Signature(s) Signed: 09/12/2020 9:05:34 AM By: Sandre Kitty Entered By: Sandre Kitty on 09/12/2020 09:04:38 -------------------------------------------------------------------------------- Compression Therapy Details Patient Name: Date of Service: [name] 09/12/2020 8:30 A M Medical Record Number: [id number] Patient Account Number: [id number] Date of Birth/Sex: Treating RN: 04/08/1951 (70 y.o. [name] Primary Care : [name] Other Clinician: Referring : Treating /Extender: Darlen Round in Treatment: 162 Compression Therapy Performed for Wound Assessment: Wound #61  Left,Circumferential Lower Leg Performed By: Clinician Rhae Hammock, RN Compression Type: Three Layer Post Procedure Diagnosis Same as Pre-procedure Electronic Signature(s) Signed: 09/12/2020 5:50:20 PM By: [name] RN, BSN Entered By: [name] on 09/12/2020 09:46:21 -------------------------------------------------------------------------------- Encounter Discharge Information Details Patient Name: Date of Service: [name] 09/12/2020 8:30 A M Medical Record Number: [id number] Patient Account Number: [id number] Date of Birth/Sex: Treating RN: [birth year] (70 y.o. [name], Lauren Primary Care : [name] Other Clinician: Referring : Treating /Extender: Darlen Round in Treatment: [phone] Encounter Discharge Information Items Discharge Condition: Stable Ambulatory Status: Wheelchair Discharge Destination: Home Transportation: Private Auto Accompanied By: husband Schedule Follow-up Appointment: Yes Clinical Summary of Care: Patient Declined Electronic Signature(s) Signed: 09/12/2020 5:43:14 PM By: Rhae Hammock RN Entered By: Rhae Hammock on 09/12/2020 10:30:46 -------------------------------------------------------------------------------- Lower Extremity Assessment Details Patient Name: Date of Service: [name] 09/12/2020 8:30 A M Medical Record Number: [id number] Patient Account Number: [id number] Date of Birth/Sex: Treating RN: February 10, 1951 (70 y.o. [name] Primary Care : [name] Other Clinician: Referring : Treating /Extender: Darlen Round in Treatment: 162 Edema Assessment Assessed: [name]: No] [name]: No] Edema: [Left: Yes] [Right: Yes] Calf Left: Right: Point of Measurement: 37 cm From Medial Instep 38 cm 42 cm Ankle Left: Right: Point of Measurement: 10 cm From Medial Instep 30 cm 25 cm Vascular  Assessment Pulses: Dorsalis Pedis Palpable: [Left:Yes] [Right:Yes] Electronic Signature(s) Signed: 09/13/2020 5:46:28 PM By: Levan Hurst RN, BSN Entered By: Levan Hurst on 09/12/2020 09:32:08 -------------------------------------------------------------------------------- [place] Details Patient Name: Date of Service: [name] 09/12/2020 8:30 A M Medical Record Number: [id number] Patient Account Number: [id number] Date of Birth/Sex: Treating RN: [birth year] (70 y.o. [name] Primary Care : [name] Other Clinician: Referring : Treating /Extender: Darlen Round in Treatment: [place] reviewed with physician Active Inactive Venous Leg Ulcer Nursing Diagnoses: Actual venous Insuffiency (use  after diagnosis is confirmed) Knowledge deficit related to disease process and management Goals: Patient will maintain optimal edema control Date Initiated: 08/12/2017 Target Resolution Date: 09/26/2020 Goal Status: Active Patient/caregiver will verbalize understanding of disease process and disease management Date Initiated: 08/12/2017 Date Inactivated: 04/21/2018 Target Resolution Date: 04/24/2018 Goal Status: Met Interventions: Assess peripheral edema status every visit. Compression as ordered Treatment Activities: Therapeutic compression applied : 08/12/2017 Notes: Wound/Skin Impairment Nursing Diagnoses: Impaired tissue integrity Knowledge deficit related to ulceration/compromised skin integrity Goals: Patient/caregiver will verbalize understanding of skin care regimen Date Initiated: 08/12/2017 Target Resolution Date: 09/26/2020 Goal Status: Active Ulcer/skin breakdown will have a volume reduction of 30% by week 4 Date Initiated: 08/05/2017 Date Inactivated: 09/30/2017 Target Resolution Date: 10/03/2017 Goal Status: Met Ulcer/skin breakdown will have a volume reduction  of 50% by week 8 Date Initiated: 09/30/2017 Date Inactivated: 10/28/2017 Target Resolution Date: 10/28/2017 Goal Status: Met Interventions: Assess patient/caregiver ability to perform ulcer/skin care regimen upon admission and as needed Assess ulceration(s) every visit Provide education on ulcer and skin care Screen for HBO Treatment Activities: Patient referred to home care : 08/05/2017 Skin care regimen initiated : 08/05/2017 Topical wound management initiated : 08/05/2017 Notes: Electronic Signature(s) Signed: 09/12/2020 5:50:20 PM By: [name] RN, BSN Entered By: [name] on 09/12/2020 09:40:31 -------------------------------------------------------------------------------- Pain Assessment Details Patient Name: Date of Service: [name] 09/12/2020 8:30 A M Medical Record Number: [id number] Patient Account Number: [id number] Date of Birth/Sex: Treating RN: 01-02-1951 (70 y.o. [name] Primary Care : [name] Other Clinician: Referring : Treating /Extender: Darlen Round in Treatment: [phone] Active Problems Location of Pain Severity and Description of Pain Patient Has Paino Yes Site Locations Rate the pain. Current Pain Level: 8 Pain Management and Medication Current Pain Management: Electronic Signature(s) Signed: 09/12/2020 9:05:34 AM By: Sandre Kitty Signed: 09/12/2020 5:50:20 PM By: [name] RN, BSN Entered By: Sandre Kitty on 09/12/2020 09:05:07 -------------------------------------------------------------------------------- Patient/Caregiver Education Details Patient Name: Date of Service: [name] 3/30/2022andnbsp8:30 A M Medical Record Number: [id number] Patient Account Number: [id number] Date of Birth/Gender: Treating RN: 1950/07/20 (70 y.o. [name] Primary Care Physician: [name] Other Clinician: Referring Physician: Treating Physician/Extender:  Darlen Round in Treatment: [phone] Education Assessment Education Provided To: Patient Education Topics Provided Venous: Methods: Explain/Verbal Responses: Reinforcements needed, State content correctly Wound/Skin Impairment: Methods: Explain/Verbal Responses: Reinforcements needed, State content correctly Electronic Signature(s) Signed: 09/12/2020 5:50:20 PM By: [name] RN, BSN Entered By: [name] on 09/12/2020 09:41:04 -------------------------------------------------------------------------------- Wound Assessment Details Patient Name: Date of Service: [name] 09/12/2020 8:30 A M Medical Record Number: [id number] Patient Account Number: [id number] Date of Birth/Sex: Treating RN: Sep 02, 1950 (70 y.o. [name] Primary Care : [name] Other Clinician: Referring : Treating /Extender: Darlen Round in Treatment: 162 Wound Status Wound Number: 61 Primary Venous Leg Ulcer Etiology: Wound Location: Left, Circumferential Lower Leg Wound Open Wounding Event: Gradually Appeared Status: Date Acquired: 04/20/2019 Comorbid Asthma, Hypertension, Peripheral Arterial Disease, Peripheral Weeks Of Treatment: 73 History: Venous Disease, Type II Diabetes, Gout, Osteoarthritis Clustered Wound: Yes Photos Wound Measurements Length: (cm) 8 Width: (cm) 20.5 Depth: (cm) 0.2 Clustered Quantity: 2 Area: (cm) 128.805 Volume: (cm) 25.761 % Reduction in Area: -1608.3% % Reduction in Volume: -3316.6% Epithelialization: Medium (34-66%) Tunneling: No Undermining: No Wound Description Classification: Full Thickness Without Exposed Support Structures Wound Margin: Indistinct, nonvisible Exudate Amount: Medium Exudate Type: Serous Exudate Color: amber Foul Odor After Cleansing:  No Slough/Fibrino No Wound Bed Granulation Amount: Large (67-100%) Exposed Structure Granulation Quality: Pink,  Pale Fascia Exposed: No Necrotic Amount: None Present (0%) Fat Layer (Subcutaneous Tissue) Exposed: Yes Tendon Exposed: No Muscle Exposed: No Joint Exposed: No Bone Exposed: No Treatment Notes Wound #61 (Lower Leg) Wound Laterality: Left, Circumferential Cleanser Peri-Wound Care Zinc Oxide Ointment 30g tube Discharge Instruction: Apply Zinc Oxiide to weeping areas with each dressing change Sween Lotion (Moisturizing lotion) Discharge Instruction: Apply moisturizing lotion as directed Topical Primary Dressing KerraCel Ag Gelling Fiber Dressing, 4x5 in (silver alginate) Discharge Instruction: Apply silver alginate to wound bed as instructed Secondary Dressing Zetuvit Plus 4x4 in Discharge Instruction: or equivalent extra absorbent pad.Apply over primary dressing as directed. Secured With Compression Wrap ThreePress (3 layer compression wrap) Discharge Instruction: Apply three layer compression as directed. Pad bend of ankle with foam or ABD pad. Compression Stockings Add-Ons Electronic Signature(s) Signed: 09/12/2020 5:25:01 PM By: Sandre Kitty Signed: 09/12/2020 5:50:20 PM By: [name] RN, BSN Entered By: Sandre Kitty on 09/12/2020 16:46:22 -------------------------------------------------------------------------------- Wound Assessment Details Patient Name: Date of Service: [name] 09/12/2020 8:30 A M Medical Record Number: [id number] Patient Account Number: [id number] Date of Birth/Sex: Treating RN: 1950-09-29 (70 y.o. [name] Primary Care Lathon Adan: [name] Other Clinician: Referring Jireh Elmore: Treating Amellia Panik/Extender: Darlen Round in Treatment: 162 Wound Status Wound Number: 64 Primary Diabetic Wound/Ulcer of the Lower Extremity Etiology: Wound Location: Left, Dorsal Foot Wound Open Wounding Event: Gradually Appeared Status: Date Acquired: 06/01/2019 Comorbid Asthma, Hypertension, Peripheral Arterial  Disease, Peripheral Weeks Of Treatment: 67 History: Venous Disease, Type II Diabetes, Gout, Osteoarthritis Clustered Wound: No Photos Wound Measurements Length: (cm) 6 Width: (cm) 4.5 Depth: (cm) 0.1 Area: (cm) 21.206 Volume: (cm) 2.121 % Reduction in Area: -12752.1% % Reduction in Volume: -4228.6% Epithelialization: Medium (34-66%) Tunneling: No Undermining: No Wound Description Classification: Grade 1 Wound Margin: Flat and Intact Exudate Amount: Medium Exudate Type: Serous Exudate Color: amber Foul Odor After Cleansing: No Slough/Fibrino No Wound Bed Granulation Amount: Large (67-100%) Exposed Structure Granulation Quality: Pink, Pale Fascia Exposed: No Necrotic Amount: None Present (0%) Fat Layer (Subcutaneous Tissue) Exposed: Yes Tendon Exposed: No Muscle Exposed: No Joint Exposed: No Bone Exposed: No Treatment Notes Wound #64 (Foot) Wound Laterality: Dorsal, Left Cleanser Peri-Wound Care Zinc Oxide Ointment 30g tube Discharge Instruction: Apply Zinc Oxiide to weeping areas with each dressing change Sween Lotion (Moisturizing lotion) Discharge Instruction: Apply moisturizing lotion as directed Topical Primary Dressing KerraCel Ag Gelling Fiber Dressing, 4x5 in (silver alginate) Discharge Instruction: Apply silver alginate to wound bed as instructed Secondary Dressing Zetuvit Plus 4x4 in Discharge Instruction: or equivalent extra absorbent pad.Apply over primary dressing as directed. Secured With Compression Wrap ThreePress (3 layer compression wrap) Discharge Instruction: Apply three layer compression as directed. Pad bend of ankle with foam or ABD pad. Compression Stockings Add-Ons Electronic Signature(s) Signed: 09/12/2020 5:25:01 PM By: Sandre Kitty Signed: 09/12/2020 5:50:20 PM By: [name] RN, BSN Entered By: Sandre Kitty on 09/12/2020 16:45:29 -------------------------------------------------------------------------------- Wound  Assessment Details Patient Name: Date of Service: [name] 09/12/2020 8:30 A M Medical Record Number: [id number] Patient Account Number: [id number] Date of Birth/Sex: Treating RN: 08-11-1950 (70 y.o. [name] Primary Care Mirage Pfefferkorn: [name] Other Clinician: Referring Rylynne Schicker: Treating Karia Ehresman/Extender: Darlen Round in Treatment: 162 Wound Status Wound Number: 67 Primary Diabetic Wound/Ulcer of the Lower Extremity Etiology: Wound Location: Right, Medial Lower Leg Wound Open Wounding Event: Gradually Appeared Status: Date Acquired:  05/02/2020 Comorbid Asthma, Hypertension, Peripheral Arterial Disease, Peripheral Weeks Of Treatment: 19 History: Venous Disease, Type II Diabetes, Gout, Osteoarthritis Clustered Wound: No Photos Wound Measurements Length: (cm) 2.5 Width: (cm) 3.5 Depth: (cm) 0.1 Area: (cm) 6.872 Volume: (cm) 0.687 % Reduction in Area: -386% % Reduction in Volume: -387.2% Epithelialization: Small (1-33%) Tunneling: No Undermining: No Wound Description Classification: Grade 1 Wound Margin: Thickened Exudate Amount: Medium Exudate Type: Serous Exudate Color: amber Foul Odor After Cleansing: No Slough/Fibrino No Wound Bed Granulation Amount: Large (67-100%) Exposed Structure Granulation Quality: Pink, Pale Fascia Exposed: No Necrotic Amount: None Present (0%) Fat Layer (Subcutaneous Tissue) Exposed: Yes Tendon Exposed: No Muscle Exposed: No Joint Exposed: No Bone Exposed: No Treatment Notes Wound #67 (Lower Leg) Wound Laterality: Right, Medial Cleanser Peri-Wound Care Zinc Oxide Ointment 30g tube Discharge Instruction: Apply Zinc Oxiide to weeping areas with each dressing change Sween Lotion (Moisturizing lotion) Discharge Instruction: Apply moisturizing lotion as directed Topical Primary Dressing KerraCel Ag Gelling Fiber Dressing, 4x5 in (silver alginate) Discharge Instruction: Apply silver  alginate to wound bed as instructed Secondary Dressing Zetuvit Plus 4x4 in Discharge Instruction: or equivalent extra absorbent pad.Apply over primary dressing as directed. Secured With Compression Wrap Kerlix Roll 4.5x3.1 (in/yd) Discharge Instruction: Apply Kerlix and Coban compression as directed. Coban Self-Adherent Wrap 4x5 (in/yd) Discharge Instruction: Apply over Kerlix as directed. Compression Stockings Add-Ons Electronic Signature(s) Signed: 09/12/2020 5:25:01 PM By: Sandre Kitty Signed: 09/12/2020 5:50:20 PM By: [name] RN, BSN Entered By: Sandre Kitty on 09/12/2020 16:44:45 -------------------------------------------------------------------------------- Wound Assessment Details Patient Name: Date of Service: [name] 09/12/2020 8:30 A M Medical Record Number: [id number] Patient Account Number: [id number] Date of Birth/Sex: Treating RN: August 18, 1950 (70 y.o. [name] Primary Care Joyice Magda: [name] Other Clinician: Referring Junior Kenedy: Treating Wells Mabe/Extender: Darlen Round in Treatment: 162 Wound Status Wound Number: 69 Primary Lymphedema Etiology: Wound Location: Right, Dorsal Foot Secondary Diabetic Wound/Ulcer of the Lower Extremity Wounding Event: Gradually Appeared Etiology: Date Acquired: 07/25/2020 Wound Open Weeks Of Treatment: 7 Status: Clustered Wound: No Comorbid Asthma, Hypertension, Peripheral Arterial Disease, Peripheral History: Venous Disease, Type II Diabetes, Gout, Osteoarthritis Photos Wound Measurements Length: (cm) 1.6 Width: (cm) 3 Depth: (cm) 0.1 Area: (cm) 3.77 Volume: (cm) 0.377 % Reduction in Area: -20% % Reduction in Volume: -20.1% Epithelialization: Small (1-33%) Tunneling: No Undermining: No Wound Description Classification: Full Thickness Without Exposed Support Structures Wound Margin: Distinct, outline attached Exudate Amount: Medium Exudate Type:  Serosanguineous Exudate Color: red, brown Foul Odor After Cleansing: No Slough/Fibrino No Wound Bed Granulation Amount: Large (67-100%) Exposed Structure Granulation Quality: Pink, Pale Fascia Exposed: No Necrotic Amount: None Present (0%) Fat Layer (Subcutaneous Tissue) Exposed: Yes Tendon Exposed: No Muscle Exposed: No Joint Exposed: No Bone Exposed: No Treatment Notes Wound #69 (Foot) Wound Laterality: Dorsal, Right Cleanser Peri-Wound Care Zinc Oxide Ointment 30g tube Discharge Instruction: Apply Zinc Oxiide to weeping areas with each dressing change Sween Lotion (Moisturizing lotion) Discharge Instruction: Apply moisturizing lotion as directed Topical Primary Dressing KerraCel Ag Gelling Fiber Dressing, 4x5 in (silver alginate) Discharge Instruction: Apply silver alginate to wound bed as instructed Secondary Dressing Zetuvit Plus 4x4 in Discharge Instruction: or equivalent extra absorbent pad.Apply over primary dressing as directed. Secured With Compression Wrap Kerlix Roll 4.5x3.1 (in/yd) Discharge Instruction: Apply Kerlix and Coban compression as directed. Coban Self-Adherent Wrap 4x5 (in/yd) Discharge Instruction: Apply over Kerlix as directed. Compression Stockings Add-Ons Electronic Signature(s) Signed: 09/12/2020 5:25:01 PM By: Sandre Kitty Signed: 09/12/2020 5:50:20 PM By: [name]  RN, BSN Entered By: Sandre Kitty on 09/12/2020 16:44:08 -------------------------------------------------------------------------------- Wound Assessment Details Patient Name: Date of Service: KILLIAN, RESS 09/12/2020 8:30 A M Medical Record Number: [id number] Patient Account Number: [id number] Date of Birth/Sex: Treating RN: [birth year] (70 y.o. [name] Primary Care : [name] Other Clinician: Referring : Treating /Extender: Darlen Round in Treatment: 162 Wound Status Wound Number: 70 Primary  Venous Leg Ulcer Etiology: Wound Location: Right, Anterior Lower Leg Wound Open Wounding Event: Gradually Appeared Status: Date Acquired: 08/29/2020 Comorbid Asthma, Hypertension, Peripheral Arterial Disease, Peripheral Weeks Of Treatment: 2 History: Venous Disease, Type II Diabetes, Gout, Osteoarthritis Clustered Wound: No Photos Wound Measurements Length: (cm) 6 Width: (cm) 4.5 Depth: (cm) 0.1 Area: (cm) 21.206 Volume: (cm) 2.121 % Reduction in Area: -1700.2% % Reduction in Volume: -1697.5% Epithelialization: None Tunneling: No Undermining: No Wound Description Classification: Full Thickness Without Exposed Support Structures Wound Margin: Flat and Intact Exudate Amount: Medium Exudate Type: Serosanguineous Exudate Color: red, brown Foul Odor After Cleansing: No Slough/Fibrino No Wound Bed Granulation Amount: Large (67-100%) Exposed Structure Granulation Quality: Pink Fascia Exposed: No Necrotic Amount: None Present (0%) Fat Layer (Subcutaneous Tissue) Exposed: Yes Tendon Exposed: No Muscle Exposed: No Joint Exposed: No Bone Exposed: No Treatment Notes Wound #70 (Lower Leg) Wound Laterality: Right, Anterior Cleanser Peri-Wound Care Zinc Oxide Ointment 30g tube Discharge Instruction: Apply Zinc Oxiide to weeping areas with each dressing change Sween Lotion (Moisturizing lotion) Discharge Instruction: Apply moisturizing lotion as directed Topical Primary Dressing KerraCel Ag Gelling Fiber Dressing, 4x5 in (silver alginate) Discharge Instruction: Apply silver alginate to wound bed as instructed Secondary Dressing Zetuvit Plus 4x4 in Discharge Instruction: or equivalent extra absorbent pad.Apply over primary dressing as directed. Secured With Compression Wrap Kerlix Roll 4.5x3.1 (in/yd) Discharge Instruction: Apply Kerlix and Coban compression as directed. Coban Self-Adherent Wrap 4x5 (in/yd) Discharge Instruction: Apply over Kerlix as  directed. Compression Stockings Add-Ons Electronic Signature(s) Signed: 09/12/2020 5:25:01 PM By: Sandre Kitty Signed: 09/12/2020 5:50:20 PM By: [name] RN, BSN Entered By: Sandre Kitty on 09/12/2020 16:43:43 -------------------------------------------------------------------------------- Vitals Details Patient Name: Date of Service: [name] 09/12/2020 8:30 A M Medical Record Number: [id number] Patient Account Number: [id number] Date of Birth/Sex: Treating RN: April 26, 1951 (70 y.o. [name] Primary Care : [name] Other Clinician: Referring : Treating /Extender: Darlen Round in Treatment: 162 Vital Signs Time Taken: 09:04 Temperature (F): 97.7 Height (in): 62 Pulse (bpm): 97 Weight (lbs): 335 Respiratory Rate (breaths/min): 18 Body Mass Index (BMI): 61.3 Blood Pressure (mmHg): 164/84 Reference Range: 80 - 120 mg / dl Electronic Signature(s) Signed: 09/12/2020 9:05:34 AM By: Sandre Kitty Entered By: Sandre Kitty on 09/12/2020 09:04:55

## 2020-09-15 LAB — AEROBIC CULTURE W GRAM STAIN (SUPERFICIAL SPECIMEN)

## 2020-09-26 ENCOUNTER — Other Ambulatory Visit: Payer: Self-pay

## 2020-09-26 ENCOUNTER — Encounter (HOSPITAL_BASED_OUTPATIENT_CLINIC_OR_DEPARTMENT_OTHER): Payer: Medicare PPO | Attending: Physician Assistant | Admitting: Physician Assistant

## 2020-09-26 DIAGNOSIS — E11621 Type 2 diabetes mellitus with foot ulcer: Secondary | ICD-10-CM | POA: Diagnosis not present

## 2020-09-26 DIAGNOSIS — I10 Essential (primary) hypertension: Secondary | ICD-10-CM | POA: Diagnosis not present

## 2020-09-26 DIAGNOSIS — L97522 Non-pressure chronic ulcer of other part of left foot with fat layer exposed: Secondary | ICD-10-CM | POA: Diagnosis not present

## 2020-09-26 DIAGNOSIS — F418 Other specified anxiety disorders: Secondary | ICD-10-CM | POA: Insufficient documentation

## 2020-09-26 DIAGNOSIS — Z6841 Body Mass Index (BMI) 40.0 and over, adult: Secondary | ICD-10-CM | POA: Diagnosis not present

## 2020-09-26 DIAGNOSIS — I890 Lymphedema, not elsewhere classified: Secondary | ICD-10-CM | POA: Insufficient documentation

## 2020-09-26 DIAGNOSIS — L97822 Non-pressure chronic ulcer of other part of left lower leg with fat layer exposed: Secondary | ICD-10-CM | POA: Insufficient documentation

## 2020-09-26 DIAGNOSIS — I87331 Chronic venous hypertension (idiopathic) with ulcer and inflammation of right lower extremity: Secondary | ICD-10-CM | POA: Diagnosis not present

## 2020-09-26 DIAGNOSIS — E11622 Type 2 diabetes mellitus with other skin ulcer: Secondary | ICD-10-CM | POA: Diagnosis not present

## 2020-09-26 DIAGNOSIS — R531 Weakness: Secondary | ICD-10-CM | POA: Insufficient documentation

## 2020-09-26 DIAGNOSIS — L97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 leg with fat layer exposed: Secondary | ICD-10-CM | POA: Insufficient documentation

## 2020-09-26 DIAGNOSIS — L97819 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 leg with unspecified severity: Secondary | ICD-10-CM | POA: Diagnosis present

## 2020-09-26 DIAGNOSIS — I87332 Chronic venous hypertension (idiopathic) with ulcer and inflammation of left lower extremity: Secondary | ICD-10-CM | POA: Diagnosis not present

## 2020-09-26 NOTE — Progress Notes (Addendum)
[name], [name] ([id number]) Visit Report for 09/26/2020 Chief Complaint Document Details Patient Name: Date of Service: [name], [name] 09/26/2020 8:30 A M Medical Record Number: [id number] Patient Account Number: [id number] Date of Birth/Sex: Treating RN: 1951/01/14 (70 y.o. [name] Primary Care Provider: [name] Other Clinician: Referring Provider: Treating Provider/Extender: Darlen Round in Treatment: [phone] Information Obtained from: Patient Chief Complaint Bilateral reoccurring LE ulcers Electronic Signature(s) Signed: 09/26/2020 9:39:10 AM By: [name] PA-C Entered By: [name] on 09/26/2020 09:39:10 -------------------------------------------------------------------------------- HPI Details Patient Name: Date of Service: [name]. 09/26/2020 8:30 A M Medical Record Number: [id number] Patient Account Number: [id number] Date of Birth/Sex: Treating RN: 1950-10-12 (70 y.o. [name] Primary Care Provider: [name] Other Clinician: Referring Provider: Treating Provider/Extender: Darlen Round in Treatment: 164 History of Present Illness HPI Description: this patient has been seen a couple of times before and returns with recurrent problems to her right and left lower extremity with swelling and weeping ulcerations due to not wearing her compression stockings which she had been advised to do during her last discharge, at the end of June 2018. During her last visit the patient had had normal arterial blood flow and her venous reflux study did not necessitate any surgical intervention. She was recommended compression and elevation and wound care. After prolonged treatment the patient was completely healed but she has been noncompliant with wearing or compressions.. She was here last week with an outpatient return visit planned but the patient came in a very poor general condition with altered  mental status and was rushed to the ER on my request. With a history of hypertension, diabetes, TIA and right-sided weakness she was set up for an MRI on her brain and cervical spine and was sent to [facility]. Getting an MRI done was very difficult but once the workup was done she was found not to have any spinal stenosis, epidural abscess or hematoma or discitis. This was radiculopathy to be treated as an outpatient and she was given a follow-up appointment. Today she is feeling much better alert and oriented and has come to reevaluate her bilateral lower extremity lymphedema and ulceration 03/25/2017 -- she was admitted to the hospital on 03/16/2017 and discharged on 03/18/2017 with left leg cellulitis and ulceration. She was started on vancomycin and Zosyn and x-ray showed no bony involvement. She was treated for a cellulitis with IV antibiotics changed to Rocephin and Flagyl and was discharged on oral Keflex and doxycycline to complete a 7 day course. Last hemoglobin A1c was 7.1 and her other ailments including hypertension got asthma were appropriately treated. 05/06/2017 -- she is awaiting the right size of compression stockings from [place] but other than that has been doing well. ====== Old notes 70 year old patient was seen one time last October and was lost to follow-up. She has recurrent problems with weeping and ulceration of her left lower extremity and has swelling of this for several years. It has been worse for the last 2 months. Past medical history is significant for diabetes mellitus type 2, hypertension, gout, morbid obesity, depressive disorders, hiatal hernia, migraines, status post knee surgery, risk of a cholecystectomy, vaginal hysterectomy and breast biopsy. She is not a smoker. As noted before she has never had a venous duplex study and an arterial ABI study was attempted but the left lower extremity was noncompressible 10/01/2016 -- had a lower extremity venous duplex  reflux evaluation which showed  no evidence of deep vein reflux in the right or left lower extremity, and no evidence of great saphenous vein reflux more than 500 ms in the right or left lower extremity, and the left small saphenous vein is incompetent but no vascular consult was recommended. review of her electronic medical records noted that the ABI was checked in July 2017 where the right ABI was normal limits and the left ABI could not be ascertained due to pain with cuff pressure but the waveforms are within normal limits. her arterial duplex study scheduled for April 27. 10/08/2016 -- the patient has various reasons for not having a compression on and for the last 3 days she has had no compression on her left lower extremity either due to pain or the lack of nursing help. She does not use her juxta lites either. 10/15/2016 -- the patient did not keep her appointment for arterial duplex study on April 27 and I have asked her to reschedule this. Her pain is out of proportion with the physical findings and she continuously fails to wear a compression wraps and cuts them off because she says she cannot tolerate the pain. She does not use her juxta lites either. 10/22/2016 -- he has rescheduled her arterial duplex study to May 21 and her pain today is a bit better. She has not been wearing her juxta lites on her right lower extremity but now understands that she needs to do this. She did tolerate the to press compression wrap on her left lower extremity 10/29/2016 --arterial duplex study is scheduled for next week and overall she has been tolerating her compression wraps and also using her juxta lites on her right lower extremity 11/05/2016 -- the right ABI was 0.95 the left was 1.03. The digit TBI is on the right was 0.83 on the left was 0.92 and she had biphasic flow through these vessels. The impression was that of normal lower extremity arterial study. 11/12/2016 -- her pain is minimal and she  is doing very well overall. 11/26/2016 -- she has got juxta lites and her insurance will not pay for additional dual layer compression stockings. She is going to order some from [place]. 05/12/2017 -- her juxta lites are very old and too big for her and these have not been helping with compression. She did get 20-30 mm compression stockings from [facility] but she and her husband are unable to put these on. I believe she will benefit from bilateral Extremit-ease, compression stockings and we will measure her for these today. 05/20/2017 -- lymphedema on the left lower extremity has increased a lot and she has a open ulceration as a result of this. The right lower extremity is looking pretty good. She has decided to by the compression stockings herself and will get reimbursed by the home health, at a later date. 05/27/2017 -- her sciatica is bothering her a lot and she thought her left leg pain was caused due to the compression wrap and hence removed it and has significant lymphedema. There is no inflammation on this left lower extremity. 06/17/17 on evaluation today patient appears to be doing very well and in fact is completely healed in regard to her ulcerations. Unfortunately however she does have continued issues with lymphedema nonetheless. We did order compression garments for her unfortunately she states that the size that she received were large although we ordered medium. Obviously this means she is not getting the optimal compression. She does not have those with her today and therefore  we could not confirm and contact the company on her behalf. Nonetheless she does state that she is going to have her husband bring them by tomorrow so that we can verify and then get in touch with the company. No fevers, chills, nausea, or vomiting noted at this time. Overall patient is doing better otherwise and I'm pleased with the progress she has made. 07/01/17 on evaluation today patient appears to be doing  very well in regard to her bilateral lower extremity she does not have any openings at this point which is excellent news. Overall I'm pleased with how things have progressed up to this time. Since she is doing so well we did order her compression which we are seeing her today to ensure that it fits her properly and everything is doing well in that regard and then subsequently she will be discharged. ============ Old Notes: 03/31/16 patient presents today for evaluation concerning open wounds that she has over the left medial ankle region as well as the left dorsal foot. She has previously had this occur although it has been healed for a number of months after having this for about a year prior until her hospitalization on 01/05/16. At that point in time it appears that she was admitted to the hospital for left lower extremity cellulitis and was placed on vancomycin and Zosyn at that point. Eventually upon discharge on January 15, 2016 she was placed on doxycycline at that point in time. Later on 03/27/16 positive wound culture growing Escherichia coli this was switched to amoxicillin. Currently she tells me that she is having pain radiated to be a 7 out of 10 which can be as high as 10 out of 10 with palpation and manipulation of the wound. This wound appears to be mainly venous in nature due to the bilateral lower extremity venous stasis/lymphedema. This is definitely much worse on her left than the right side. She does have type 1 diabetes mellitus, hypertension, morbid obesity, and is wheelchair dependent.during the course of the hospital stay a blood culture was also obtained and fortunately appeared negative. She also had an x-ray of the tibia/fibula on the left which showed no acute bone abnormality. Her white blood cell count which was performed last on 03/25/16 was 7.3, hemoglobin 12.8, protein 7.1, albumin 3.0. Her urine culture appeared to be negative for any specific organisms. Patient did  have a left lower extremity venous duplex evaluation for DVT . This did not include venous reflux studies but fortunately was negative for DVT Patient also had arterial studies performed which revealed that she had a . normal ABI on the right though this was unable to be performed on the left secondary to pain that she was having around the ankle region due to the wound. However it was stated on report that she had biphasic pulses and apparently good blood flow. ========== 06/03/17 she is here in follow-up evaluation for right lower extremity ulcer. The right lower sure he has healed but she has reopened to the left medial malleolus and dorsal foot with weeping. She is waiting for new compression garments to arrive from home health, the previous compression garments were ill fitting. We will continue with compression bilaterally and follow-up in 2 weeks Readmission: 08/05/17 on evaluation today patient appears to be doing somewhat poorly in regard to her left lower extremity especially although the right lower extremity has a small area which may no longer be open. She has been having a lot of drainage from the  left lower extremity however he tells me that she has not been able to use the EXTREMIT-EASE Compression at this point. She states that she did better and was able to actually apply the Juxta-Lite compression although the wound that she has is too large and therefore really does not compress which is why she cannot wear it at this point. She has no one who can help her put it on regular basis her son can sometimes but he's not able to do it most of the time. I do believe that's why she has begun to weave and have issues as she is currently yet again. No fevers, chills, nausea, or vomiting noted at this time. Patient is no evidence of dementia. 08/12/17 on evaluation today patient appears to still be doing fairly well in regard to the draining areas/weeping areas at this point. With that being  said she unfortunately did go to the ER yesterday due to what was felt to be possibly a cellulitis. They place her on doxycycline by mouth and discharge her home. She definitely was not admitted. With that being said she states she has had more discomfort which has been unusual for her even compared to prior times and she's had infections.08/12/17 on evaluation today patient appears to still be doing fairly well in regard to the draining areas/weeping areas at this point. With that being said she unfortunately did go to the ER yesterday due to what was felt to be possibly a cellulitis. They place her on doxycycline by mouth and discharge her home. She definitely was not admitted. With that being said she states she has had more discomfort which has been unusual for her even compared to prior times and she's had infections. 08/19/17 put evaluation today patient tells me that she's been having a lot of what sounds to be neuropathic type pain in regard to her left lower extremity. She has been using over-the-counter topical bins again which some believe. That in order to apply the she actually remove the wrap we put on her last Wednesday on Thursday. Subsequently she has not had anything on compression wise since that time. The good news is a lot of the weeping areas appear to have closed at this point again I believe she would do better with compression but we are struggling to get her to actually use what she needs to at this point. No fevers, chills, nausea, or vomiting noted at this time. 09/03/17 on evaluation today patient appears to be doing okay in regard to her lower extremities in regard to the lymphedema and weeping. Fortunately she does not seem to show any signs of infection at this point she does have a little bit of weeping occurring in the right medial malleolus area. With that being said this does not appear to be too significant which is good news. 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 She has severe skin damage secondary to both of these features involving the dorsal left foot and medial left ankle and lower leg. Still has open areas in the left anterior foot. The area on the right closed over. She uses her own juxta light stockings. She does not have an arterial issue 09/16/17 on evaluation today patient actually appears to be doing excellent in regard to her bilateral lower extremity swelling. The Juxta-Lite compression wrap seem to be doing very well for her. She has not however been using the portion that goes over her foot.  Her left foot still is draining a little bit not nearly as significant as it has been in the past but still I do believe that she likely needs to utilize the full wrap including the foot portion of this will improve as well. She also has been apparently putting on a significant amount of Vaseline which also think is not helpful for her. I recommended that if she feels she needs something for moisturizer Eucerin will probably be better. 09/30/17 on evaluation today patient presents with several new open areas in regard to her left lower extremity although these appear to be minimal and mainly seem to be more moisture breakdown than anything. Fortunately she does not seem to have any evidence of infection which is great news. She has been tolerating the dressing changes without complication we are using silver alginate on the foot she has been using AB pads to have the legs and using her Juxta- Lite compression which seems to be controlling her swelling very well. Overall I'm pleased with the poor way she has progressed. 10/14/17 on evaluation today patient appears to be doing better in regard to her left lower extremity areas of weeping. She does still have some discomfort although in general this does not appear to be as macerated and I think it is progressing nicely. I do think she still  needs to wear the foot portion of her Juxta- Lite in order to get the most benefit from the wrap obviously. She states she understands. Fortunately there does not appear to be evidence of infection at this time which is great news. 10/28/17 on evaluation today patient appears to be doing excellent in regard to her left lower extremity. She has just a couple areas that are still open and seem to be causing any trouble whatsoever. For that reason I think that she is definitely headed in the right direction the spots are very tiny compared to what we have been dealing with in the past. 11/11/17 on evaluation today patient appears to have a right lateral lower extremity ulcer that has opened since I last saw her. She states this is where the home health nurse that was coming out remove the dressing without wetting the alginate first. Nonetheless I do not know if this is indeed the case or not but more importantly we have not ordered home help to be coming out for her wounds at all. I'm unsure as to why they are coming out and we're gonna have to check on this and get things situated in that regard. With that being said we currently really do not need them to be coming out as the patient has been taking care of her leg herself without complication and no issues. In fact she was doing much better prior to nursing coming out. 11/25/17 on evaluation today patient actually appears to be doing fairly well in regard to her left lower extremity swelling. In fact she has very little area of weeping at this point there's just a small spot on the lateral portion of her right leg that still has me just a little bit more concerned as far as wanting to see this clear up before I discharge her to caring for this at home. Nonetheless overall she has made excellent progress. 12/09/17 on evaluation today patient appears to be doing rather well in regard to her lower extremity edema. She does have some weeping still in the left  lower extremity although the big area we were taking care of two  weeks ago actually has closed and she has another area of weeping on the left lower extremity immediately as well is the top of her foot. She does not currently have lymphedema pumps she has been wearing her compression daily on a regular basis as directed. With that being said I think she may benefit from lymphedema pumps. 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 We had a very difficult time getting and keeping this under control. 12/23/17 on evaluation today patient actually appears to be doing a little bit more poorly in regard to her bilateral lower extremities. She has been tolerating the Juxta-Lite compression wraps. Unfortunately she has two new ulcers on the right lower extremity and left lower Trinity ulceration seems to be larger. Obviously this is not good news. She has been tolerating the dressings without complication. 12/30/17 on evaluation today patient actually appears to be doing much better in regard to her bilateral lower extremity edema. She continues to have some issues with ulcerations and in fact there appears to be one spot on each leg where the wrap may have caused a little bit of a blister which is subsequently opened up at this point is given her pain. Fortunately it does not appear to be any evidence of infection which is good news. No fevers chills noted. 01/13/18 on evaluation today patient appears to be doing rather well in regard to her bilateral lower extremities. The dressings did get kind of stuck as far as the wound beds are concerned but again I think this is mainly due to the fact that she actually seems to be showing signs of healing which is good news. She's not having as much drainage therefore she was having more of the dressing sticking.  Nonetheless overall I feel like her swelling is dramatically down compared to previous. 01/20/18 on evaluation today patient unfortunately though she's doing better in most regards has a large blister on the left anterior lower extremity where she is draining quite significantly. Subsequently this is going to need debridement today in order to see what's underneath and ensure she does not continue to trapping fluid at this location. Nonetheless No fevers, chills, nausea, or vomiting noted at this time. 01/27/18 on evaluation today patient appears to be doing rather well at this point in regard to her right lower extremity there's just a very small area that she still has open at this point. With that being said I do believe that she is tolerating the compression wraps very well in making good progress. Home health is coming out at this point to see her. Her left lower extremity on the lateral portion is actually what still mainly open and causing her some discomfort for the most part 02/10/18 on evaluation today patient actually appears to be doing very well in regard to her right lower extremity were all the ulcers appear to be completely close. In regard to the left lower extremity she does have two areas still open and some leaking from the dorsal surface of her foot but this still seems to be doing much better to me in general. 02/24/18 on evaluation today patient actually appears to be doing much better in regard to her right lower extremity this is still completely healed. Her left lower extremity is also doing much better fortunately she has no evidence of infection. The one  area that is gonna require some debridement is still on the left anterior shin. Fortunately this is not hurting her as badly today. 03/10/18 on evaluation today patient appears to be doing better in some regards although she has a little bit more open area on the dorsal foot and she also has some issues on the medial portion of  the left lower extremity which is actually new and somewhat deep. With that being said there fortunately does not appear to be any significant signs of infection which is good news. No fevers, chills, nausea, or vomiting noted at this time. In general her swelling seems to be doing fairly well which is good news. 03/31/18 on evaluation today patient presents for follow-up concerning her left lower extremity lymphedema. Unfortunately she has been doing a little bit more poorly since I last saw her in regard to the amount of weeping that she is experiencing. She's also having some increased pain in the anterior shin location. Unfortunately I do not feel like the patient is making such good progress at this point a few weeks back she was definitely doing much better. 04/07/18 on evaluation today patient actually appears to be showing some signs of improvement as far as the left lower extremity is concerned. She has been tolerating the dressing changes and it does appear that the Drawtex did better for her. With that being said unfortunately home health is stating that they cannot obtain the Drawtex going forward. Nonetheless we're gonna have to check and see what they may be able to get the alginate they were using was getting stuck in causing new areas of skin being pulled all that with and subsequently weep and calls her to worsen overall this is the first time we've seen improvement at this time. 04/14/18 on evaluation today patient actually appears to be doing rather well at this point there does not appear to be any evidence of infection at this time and she is actually doing excellent in regard to the weeping in fact she almost has no openings remaining even compared to just last week this is a dramatic improvement. No fevers chills noted 04/21/18 evaluation today patient actually appears to be doing very well. She in fact is has a small area on the posterior lower extremity location and she has  a small area on the dorsal surface of her foot that are still open both of which are very close to closing. We're hoping this will be close shortly. She brought her Juxta-Lite wrap with her today hoping that would be able to put her in it unfortunately I don't think were quite at that point yet but we're getting closer. 04/28/18 upon evaluation today patient actually appears to be doing excellent in regard to her left lower extremity ulcer. In fact the region on the posterior lower extremity actually is much smaller than previously noted. Overall I'm very happy with the progress she has made. She again did bring her Juxta-Lite although we're not quite ready for that yet. 05/11/18 upon evaluation today patient actually appears to be doing in general fairly well in regard to her left lower Trinity. The swelling is very well controlled. With that being said she has a new area on the left anterior lower extremity as well as between the first and second toes of her left foot that was not present during the last evaluation. The region of her posterior left lower extremity actually appears to be almost completely healed. T be honest I'm very pleased  with o the way that stands. Nonetheless I do believe that the lotion may be keeping the area to moist as far as her legs are concerned subsequently I'm gonna consider discontinuing that today. 05/26/18 on evaluation today patient appears to be doing rather well in regard to her left lower should be ulcers. In fact everything appears to be close except for a very small area on the left posterior lower extremity. Fortunately there does not appear to be any evidence of infection at this time. Overall very pleased with her progress. 06/02/18 and evaluation today patient actually appears to be doing very well in regard to her lower extremity ulcers. She has one small area that still continues to weep that I think may benefit her being able to justify lotion and user  Juxta-Lite wraps versus continued to wrap her. Nonetheless I think this is something we can definitely look into at this point. 06/23/18 on evaluation today patient unfortunately has openings of her bilateral lower extremities. In general she seems to be doing much worse than when I last saw her just as far as her overall health standpoint is concerned. She states that her discomfort is mainly due to neuropathy she's not having any other issues otherwise. No fevers, chills, nausea, or vomiting noted at this time. 06/30/18 on evaluation today patient actually appears to be doing a little worse in regard to her right lower extremity her left lower extremity of doing fairly well. Fortunately there is no sign of infection at this time. She has been tolerating the dressing changes without complication. Home health did not come out like they were supposed to for the appropriate wrap changes. They stated that they never received the orders from [place] which were fax. Nonetheless we will send a copy of the orders with the patient today as well. 07/07/18 on evaluation today patient appears to be doing much better in regard to lower extremities. She still has several openings bilaterally although since I last saw her her legs did show obvious signs of infection when she later saw her nurse. Subsequently a culture was obtained and she is been placed on Bactrim and Keflex. Fortunately things seem to be looking much better it does appear she likely had an infection. Again last week we'd even discussed it but again there really was not any obvious sign that she had infection therefore we held off on the antibiotics. Nonetheless I'm glad she's doing better today. 07/14/18 on evaluation today patient appears to be doing much better regarding her bilateral lower Trinity's. In fact on the right lower for me there's nothing open at this point there are some dry skin areas at the sites where she had infection. Fortunately there is  no evidence of systemic infection which is excellent news. No fevers chills noted 07/21/18 on evaluation today patient actually appears to be doing much better in regard to her left lower extremity ulcers. She is making good progress and overall I feel like she's improving each time I see her. She's having no pain I do feel like the infection is completely resolved which is excellent news. No fevers, chills, nausea, or vomiting noted at this time. 07/28/18 on evaluation today patient appears to be doing very well in regard to her left lower [employer]. Everything seems to be showing signs of improvement which is excellent news. Overall very pleased with the progress that has been made. Fortunately there's no evidence of active infection at this time also excellent news. 08/04/18 on evaluation  today patient appears to be doing more poorly in regard to her bilateral lower extremities. She has two new areas open up on the right and these were completely closed as of last week. She still has the two spots on the left which in my pinion seem to be doing better. Fortunately there's no evidence of infection again at this point. 08/11/18 on evaluation today patient actually appears to be doing very well in regard to her bilateral lower Trinity wounds that all seem to be doing better and are measures smaller today. Fortunately there's no signs of infection. No fevers, chills, nausea, or vomiting noted at this time. 08/18/18 on evaluation today patient actually appears to be doing about the same inverter bilateral lower extremities. She continues to have areas that blistering open as was drain that fortunately nothing too significant. Overall I feel like Drawtex may have done better for her however compared to the collagen. 08/25/18 on evaluation today patient appears to be doing a little bit more poorly today even compared to last time I saw her. Again I'm not exactly sure why she's making worse progress over  the past several weeks. I'm beginning to wonder if there is some kind of underlying low level infection causing this issue. I did actually take a culture from the left anterior lower extremity but it was a new wound draining quite a bit at this point. Unfortunately she also seems to be having more pain which is what also makes me worried about the possibility of infection. This is despite never erythema noted at this point. 09/01/18 on evaluation today patient actually appears to be doing a little worse even compared to last week in regard to bilateral lower extremities. She did go to the hospital on the 16th was given a dose of IV Zosyn and then discharged with a recommendation to continue with the Bactrim that I previously prescribed for her. Nonetheless she is still having a lot of discomfort she tells me as well at this time. This is definitely unfortunate. No fevers, chills, nausea, or vomiting noted at this time. 09/08/18 on evaluation today patient's bilateral lower extremities actually appear to be shown signs of improvement which is good news. Fortunately there does not appear to be any signs of active infection I think the anabiotic is helping in this regard. Overall I'm very pleased with how she is progressing. 09/15/18 patient was actually seen in ER yesterday due to her legs as well unfortunately. She states she's been having a lot of pain and discomfort as well as a lot of drainage. Upon inspection today the patient does have a lot of swelling and drainage I feel like this is more related to lymphedema and poor fluid control than it is to infection based on what I'm seeing. The physician in the emergency department also doubted that the patient was having a significant infection nonetheless I see no evidence of infection obvious at this point although I do see evidence of poor fluid control. She still not using a compression pumps, she is not elevating due to her lift chair as well as her  hospital bed being broken, and she really is not keeping her legs up as much as they should be and also has been taking off her wraps. All this combined I think has led to poor fluid control and to be honest she may be somewhat volume overloaded in general as well. I recommend that she may need to contact your physician to see if a  prescription for a diuretic would be beneficial in their opinion. As long as this is safe I think it would likely help her. 09/29/18 on evaluation today patient's left lower extremity actually appears to be doing quite a bit better. At least compared to last time that I saw her. She still has a large area where she is draining from but there's a lot of new skin speckled trout and in fact there's more new skin that there are open areas of weeping and drainage at this point. This is good news. With regard to the right lower extremity this is doing much better with the only open area that I really see being a dry spot on the right lateral ankle currently. Fortunately there's no signs of active infection at this time which is good news. No fevers, chills, nausea, or vomiting noted at this time. The patient seems somewhat stressed and overwhelmed during the visit today she was very lethargic as such. She does and she is not taking any pain medications at this point. Apparently according to her husband are also in the process of moving which is probably taking its toll on her as well. 10/06/18 on evaluation today patient appears to be doing rather well in regard to her lower extremities compared to last evaluation. Fortunately there's no signs of active infection. She tells me she did have an appointment with her primary. Nonetheless he was concerned that the wounds were somewhat deep based on pictures but we never actually saw her legs. She states that he had her somewhat worried due to the fact that she was fearing now that she was [place] have to have an amputation. With that being  said based on what I'm seeing check she looks better this week that she has the last two times I've seen her with much less drainage I'm actually pleased in this regard. That doesn't mean that she's out of the water but again I do not think what the point of talking about education at all in regard to her leg. She is very happy to hear this. She is also not having as much pain as she was having last week. 10/13/18 unfortunately on evaluation today patient still continues to have a significant amount of drainage she's not letting home health actually apply the compression dressings at this point. She's trying to use of Juxta-Lite of the top of Kerlex and the second layer of the three layer compression wrap. With that being said she just does not seem to be making as good a progress as I would expect if she was having the compression applied and in place on a regular basis. No fevers, chills, nausea, or vomiting noted at this time. 10/20/18 on evaluation today patient appears to be doing a little better in regard to her bilateral lower extremity ulcers. In fact the right lower extremity seems to be healed she doesn't even have any openings at this point left lower extremity though still somewhat macerated seems to be showing signs of new skin growth at multiple locations throughout. Fortunately there's no evidence of active infection at this time. No fevers, chills, nausea, or vomiting noted at this time. 10/27/18 on evaluation today patient appears to be doing much better in regard to her left lower Trinity ulcer. She's been tolerating the laptop complication and has minimal drainage noted at this point. Fortunately there's no signs of active infection at this time. No fevers, chills, nausea, or vomiting noted at this time. 11/03/18 on evaluation today  patient actually appears to be doing excellent in regard to her left lower extremity. She is having very little drainage at this point there does not appear  to be any significant signs of infection overall very pleased with how things have gone. She is likewise extremely pleased still and seems to be making wonderful progress week to week. I do believe antibiotics were helpful for her. Her primary care provider did place on amateur clean since I last saw her. 11/17/18 on evaluation today patient appears to be doing worse in regard to her bilateral lower extremities at this point. She is been tolerating the dressing changes without complication. With that being said she typically takes the Coban off fairly quickly upon arriving home even after being seen here in the clinic and does not allow home health reapply command as part of the dressing at home. Therefore she said no compression essentially since I last saw her as best I can tell. With that being said I think it shows and how much swelling she has in the open wounds that are noted at this point. Fortunately there's no signs of infection but unfortunately if she doesn't get this under control I think she will end up with infection and more significant issues. 11/24/18 on evaluation today patient actually appears to be doing somewhat better in regard to her bilateral lower extremities. 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 Nonetheless that is doing better currently so she should be able to attempt reinitiating the lymphedema pumps at this time. No fevers, chills, nausea, or vomiting noted at this time. 12/01/18 upon evaluation today patient's left lower extremity appears to be doing quite well unfortunately her right lower extremity is not doing nearly as well. She has been tolerating the dressing changes without complication unfortunately she did not keep a wrap on the right at this time. Nonetheless I believe this has led to increased swelling and weeping in  the world is actually much larger than during the last evaluation with her. 12/08/18 on evaluation today patient appears to be doing about the same at this point in regard to her right lower extremity. There is some more palatable to touch I'm concerned about the possibility of there being some infection although I think the main issue is she's not keeping her compression wrap on which in turn is not allowing this area to heal appropriately. 12/22/18 on evaluation today patient appears to be doing better in regard to left lower extremity unfortunately significantly worse in regard to the right lower extremity. The areas of blistering and necrotic superficial tissue have spread and again this does not really appear to be signs of infection and all she just doesn't seem to be doing nearly as well is what she has been in the past. Overall I feel like the Augmentin did absolutely nothing for her she doesn't seem to have any infection again I really didn't think so last time either is more of a potential preventative measure and hoping that this would make some difference but I think the main issue is she's not wearing her compression. She tells me she cannot wear the [place] been we put on she takes it off pretty much upon getting home. Subsequently she worshiped Juxta-Lite when I questioned her about how often she wears it this is no  more than three hours a day obviously that leaves 21 hours that she has no compression and this is obviously not doing well for her. Overall I'm concerned that if things continue to worsen she is at great risk of both infection as well as losing her leg. 01/05/19 on evaluation today patient appears to be doing well in regard to her left lower extremity which he is allowing [place] to wrap and not so well with regard to her right lower extremity which she is not allowing [place] to really wrap and keep the wrap on. She states that it hurts too badly whenever it's wrapped and she ends up  having to take it off. She's been using the Juxta-Lite she tells me up to six hours a day although I question whether or not that's really been the case to be honest. Previously she told me three hours today nonetheless obviously the legs as long as the wrap is doing great when she is not is doing much more poorly. 01/12/2019 on evaluation today patient actually appears to be doing a little better in my opinion with regard to her right lower extremity ulcer. She has a small open area on the left lower extremity unfortunately but again this I think is part of the normal fluctuation of what she is going to have to expect with regard to her legs especially when she is not using her lymphedema pumps on a regular basis. Subsequently based on what I am seeing today I think that she does seem to be doing slightly better with regard to her right lower extremity she did see her primary care provider on Monday they felt she had an infection and placed her on 2 antibiotics. Both Cipro and clindamycin. Subsequently again she seems possibly to be doing a little bit better in regards to the right lower extremity she also tells me however she has been wearing the compression wrap over the past week since I spoke with her as well that is a Kerlix and Coban wrap on the right. No fevers, chills, nausea, vomiting, or diarrhea. 01/19/2019 on evaluation today patient appears to be doing better with regard to her bilateral lower extremities especially the right. I feel like the compression has been beneficial for her which is great news. She did get a call from her primary care provider on her way here today telling her that she did have methicillin- resistant Staphylococcus aureus and he was calling in a couple new antibiotics for her including a ointment to be applied she tells me 3 times a day. With that being said this sounds like likely to be Bactroban which I think could be applied with each dressing/wrap change but I  would not be able to accommodate her applying this 3 times a day. She is in agreement with the least doing this we will add that to her orders today. 01/26/2019 on evaluation today patient actually appears to be doing much better with regard to her right lower extremity. Her left lower extremity is also doing quite well all things considering. Fortunately there is no evidence of active infection at this time. No fevers, chills, nausea, vomiting, or diarrhea. 02/02/2019 on evaluation today patient appears to be doing much better compared to her last evaluation. Little by little off like her right leg is returning more towards normal. There does not appear to be any signs of active infection and overall she seems to be doing quite well which is great news. I am very pleased  in this regard. No fevers, chills, nausea, vomiting, or diarrhea. 02/09/2019 upon evaluation today patient appears to be doing better with regard to her bilateral lower extremities. She has been tolerating the dressing changes without complication. Fortunately there is no signs of active infection at this time. No fevers, chills, nausea, vomiting, or diarrhea. 02/23/2019 on evaluation today patient actually appears to be doing quite well with regard to her bilateral lower extremities. She has been tolerating the dressing changes without complication. She is even used her pumps one time and states that she really felt like it felt good. With that being said she seems to be in good spirits and her legs appear to be doing excellent. 03/09/2019 on evaluation today patient appears to be doing well with regard to her right lower extremity there are no open wounds at this time she is having some discomfort but I feel like this is more neuropathy than anything. With regard to her left lower extremity she had several areas scattered around that she does have some weeping and drainage from but again overall she does not appear to be having any  significant issues and no evidence of infection at this time which is good news. 03/23/2019 on evaluation today patient appears to be doing well with regard to her right lower extremity which she tells me is still close she is using her juxta light here. Her left lower extremity she mainly just has an area on the foot which is still slightly draining although this also is doing great. Overall very pleased at this time. 04/06/2019 patient appears to be doing a little bit worse in regard to her left lower extremity upon evaluation today. She feels like this could be becoming infected again which she had issues with previous. Fortunately there is no signs of systemic infection but again this is always a struggle with her with her legs she will go from doing well to not so well in a very short amount of time. 04/20/2019 on evaluation today patient actually appears to be doing quite well with regard to her right lower extremity I do not see any signs of active infection at this time. Fortunately there is no fever chills noted. She is still taking the antibiotics which I prescribed for her at this point. In regard to the left lower extremity I do feel like some of these areas are better although again she still is having weeping from several locations at this time. 04/27/2019 on evaluation today patient appears to be doing about the same if not slightly worse in regard to her left lower extremity ulcers. She tells me when questioned that she has been sleeping in her Hoveround chair in fact she tells me she falls asleep without even knowing it. I think she is spending a whole lot of time in the chair and less time walking and moving around which is not good for her legs either. On top of that she is in a seated position which is also the worst position she is not really elevating her legs and she is also not using her lymphedema pumps. All this is good to contribute to worsening of her condition in  general. 05/18/2019 on evaluation today patient appears to be doing well with regard to her lower extremity on the right in fact this is showing no signs of any open wounds at this time. On the left she is continuing to have issues with areas that do drain. Some of the regions have healed and  there are couple areas that have reopened. She did go to the ER per the patient according to recommendations from the home health nurse due to what she was seen when she came out on 05/13/2019. Subsequently she felt like the patient needed to go to the hospital due to the fact that again she was having "milky white discharge" from her leg. Nonetheless she had and then was placed on doxycycline and subsequently seems to be doing better. 06/01/2019 upon evaluation today patient appears to be doing really in my opinion about the same. I do not see any signs of active infection which is good news. Overall she still has wounds over the bilateral lower extremities she has reopened on the right but this appears to be more of a crack where there is weeping/edema coming from the region. I do not see any evidence of infection at either site based on what I visualized today. 07/13/2019 upon evaluation today patient appears to be doing a little worse compared to last time I saw her. She since has been in the hospital from 06/21/2019 through 06/29/2019. This was secondary to having Covid. During that time they did apply lotion to her legs which unfortunately has caused her to develop a myriad of open wounds on her lower extremities. Her legs do appear to be doing better as far as the overall appearance is concerned but nonetheless she does have more open and weeping areas. 07/27/2019 upon evaluation today patient appears to be doing more poorly to be honest in regard to her left lower extremity in particular. There is no signs of systemic infection although I do believe she may have local infection. She notes she has been having  a lot of blue/green drainage which is consistent potentially with Pseudomonas. That may be something that we need to consider here as well. The doxycycline does not seem to have been helping. 08/03/2019 upon evaluation today patient appears to be doing a little better in my opinion compared to last week's evaluation. Her culture I did review today and she is on appropriate medications to help treat the Enterobacter that was noted. Overall I feel like that is good news. With that being said she is unfortunately continuing to have a lot of drainage and though it is doing better I still think she has a long ways to go to get things dried up in general. Fortunately there is no signs of systemic infection. 08/10/2019 upon evaluation today patient appears to be doing may be slightly better in regard to her left lower extremity the right lower extremity is doing much better. Fortunately there is no signs of infection right now which is good news. No fevers, chills, nausea, vomiting, or diarrhea. 08/24/2019 on evaluation today patient appears to be doing slightly better in regard to her lower extremities. The left lower extremity seems to be healed the right lower extremity is doing better though not completely healed as far as the openings are concerned. She has some generalized issues here with edema and weeping secondary to her lymphedema though again I do believe this is little bit drier compared to prior weeks evaluations. In general I am very pleased with how things seem to be progressing. No fevers, chills, nausea, vomiting, or diarrhea. 08/31/2019 upon evaluation today patient actually seems to making some progress here with regard to the left lower extremity in particular. She has been tolerating the dressing changes without complication. Fortunately there is no signs of active infection at this time. No fevers,  chills, nausea, vomiting, or diarrhea. She did see Dr. Doren Custard and he did note that she did have  a issue with the left great saphenous vein and the small saphenous vein in the leg. With that being said he was concerned about the possibility of laser ablation not being extremely successful. He also mentioned a small risk of DVT associated with the procedure. However if the wounds do not continue to improve he stated that that would probably be the way to go. Fortunately the patient's legs do seem to be doing much better. 09/07/2019 upon evaluation today patient appears to be doing better with regard to her lower extremities. She has been tolerating the dressing changes without complication. With that being said she is showing signs of improvement and overall very pleased. There are some areas on her leg that I think we do need to debride we discussed this last week the patient is in agreement with doing that as long as it does not hurt too badly. 09/14/2019 upon evaluation today patient appears to be doing decently well with regard to her left lower extremity. She is not having near as much weeping as she has had in the past things seem to be drying up which is good news. There is no signs of active infection at this time. 09/21/19 upon evaluation today patient appears to be doing better in regard overall to her bilateral lower extremities. She again has less open than she did previous and each week I feel like this is getting better. Fortunately there is no signs of active infection at this time. No fevers, chills, nausea, vomiting, or diarrhea. 09/28/2019 upon evaluation today patient actually appears to be showing signs of improvement with regard to her left lower extremity. Unfortunately the right medial lower extremity around the ankle region has reopened to some degree but this appears to be minimal still which is good news. There is no signs of active infection at this time which is also good news. 10/12/2019 upon evaluation today patient appears to be doing okay with regard to her bilateral  lower extremities today. The right is a little bit worse then last evaluation 2 weeks ago. The left is actually doing a little better in my opinion. Overall there is no signs of active infection at this time that I see. Obviously that something we have to keep a close eye on she is very prone to this with the significant and multiple openings that she has over the bilateral lower extremities. 10/19/2019 upon evaluation today patient appears to be doing about the best that I have seen her in quite some time. She has been tolerating the dressing changes without complication. There does not appear to be any signs of active infection and overall I am extremely happy with the way her legs appeared. She is drying up quite nicely and overall is having less pain. 11/09/2019 upon evaluation today patient appears to be doing better in regard to her wounds. She seems to be drying up more and more each time I see her this is just taking a very long time. Fortunately there is no signs of active infection at this time. 11/23/2019 upon evaluation today patient actually appears to be doing excellent in regard to her lower extremities at this point compared to where she has been. Fortunately there is no signs of active infection at this time. She did go to the hospital last week for nausea and vomiting completely unrelated to her wounds. Fortunately she is doing better  she was given some Reglan and got better. She had associated abdominal pain but they never found out what was going on. 12/07/2019 upon evaluation today patient appears to be doing well for the most part in regard to her legs. She unfortunately has not been keeping the Coban portion of her wraps on therefore the compression has not really been sufficient for what it is supposed to be. Nonetheless she tells me that it just hurt too bad therefore she removed it. 12/21/2019 upon evaluation today patient actually appears to be doing quite well with regard to her  legs. I do feel like she has been making progress which is great news and overall there is no signs of active infection at this time. No fevers, chills, nausea, vomiting, or diarrhea. 01/04/2020 upon evaluation today patient presents for follow-up concerning her lower extremity edema bilaterally. She still has open wounds she has not been using her lymphedema pumps. She is also not been utilizing her compression wraps appropriately she tends to unwrap them, take them off, or states that they hurt. Obviously the reason they hurt is because her legs start to swell but the issue is if she would use her compression/lymphedema pumps regularly she would not swell and she would have the pain. Nonetheless she has not even picked them up once honestly over the past several months and may be even as much as in the past year based on my opinion and what have seen. She tells me today that after last week when I talked about this with her specifically actually that was 2 weeks ago that she "forgot". 8//21 on evaluation today patient appears to be doing a little better in regard to her legs bilaterally. Fortunately there is no signs of active infection at this time. She tells me that she used her lymphedema pumps all of one time over the past 2 weeks since I last saw her. She tells me that she has been too busy in order to continue to use these. 02/01/2020 on evaluation today patient appears to be doing some better in regard to her wounds in general in her legs. We felt the right was healed although is not completely it does appear to be doing better she tells me she has been using her lymphedema pumps that she has had this six times since I last saw her. Obviously the more she does that the better she would do my opinion 02/15/2020 upon evaluation today patient appears to be doing about the same in regard to her legs. She tells me that she is pumping I'm still not sure how much she does to be perfectly honest. However  even if she does a little bit here and there I guess that is better than nothing. Fortunately there is no sign of active infection at this time which is great news. No fevers, chills, nausea, vomiting, or diarrhea. 02/29/2020 on evaluation today patient actually appears to be doing quite well all things considered this week. She has been tolerating the dressing changes without complication. Fortunately there is no signs of active infection at this time. No fevers, chills, nausea, vomiting, or diarrhea. 03/14/2020 upon evaluation today patient appears to be doing really about the same in regard to her legs. There is no signs of improvement overall and she as noted from home health does not appear to be elevating her legs he can get into her lift chair. There is too much stuff piled up on it the patient tells me. She also  tells me she cannot really use her pumps effectively due to the fact that she cannot have any space to get them on. Finally she is also not really elevating her legs because she is not sleeping in her bed she is sleeping in her chair currently and again overall I think everything that she is done in combination has been exactly the wrong thing for what she needs for her legs. 04/04/2020 upon evaluation today patient appears to be doing well at this time with regard to her legs. She is actually been pumping, keeping her wraps on, and to be honest she seems to be doing dramatically better the right leg is excellent the left leg is also excellent and measuring much smaller than previous. 04/18/2020 upon evaluation today patient actually is continue to make good progress in regard to her lower extremities bilaterally. Everything is improving and less wet that has been in the past overall I am extremely pleased with where things stand and I think that she is making great progress. The patient tells me she still continue to use her compression pumps 05/02/2020 on evaluation today patient appears  to be doing well at this time in regard to her left leg which is showing signs of drying up. With that being said she does have a lot of lymphedema type crusty skin around the toes of her left foot and the right medial ankle which has opened at this point. Fortunately there is no signs of active infection systemically at this point or even locally for that matter. 05/23/2020 on evaluation today patient appears to be doing well with regard to her lower extremities. Fortunately there is no signs of active infection at this time. No fever chills noted. She has been very depressed however she tells me. 06/06/2020 patient came in today for evaluation in regard to her bilateral lower extremity ulcerations. With that being said she came in feeling okay and actually laughing and joking around with the staff checking her in. Subsequently however she had a coughing spell and following the coughing spell it was a dramatic conversion from being jovial and joking around to being extremely short of breath her vital signs actually dropped in regard to her blood pressure from around 175 to down around 433 for systolic and from around 96 diastolic down to around 70. With that being said she also accompanied this with an increase in her respiratory rate which was also quite significant. Nonetheless I actually upon going into see her was extremely worried we called EMS to have her transported to the ER for further evaluation and treatment. She continued until EMS got here to be extremely short of breath even on 6 L of oxygen. Her oxygen saturation did come up to 99% but overall it was only 95 before which was not terrible she did feel like the oxygen helped her feel somewhat better however. She has a history of asthma but again even the coughing spell really should not have done this degree of alteration in her demeanor from where she was just before to after the coughing spell. She tells [place] however she has been feeling  somewhat abnormal since Friday although she could not really pinpoint exactly what was going on. 06/27/2020 plan evaluation today patient appears to be doing okay in regard to her leg ulcers. She is really not showing a lot of improvement to be honest and she still has a lot of weeping. With that being said after I last saw her we called  EMS to take her to the hospital she actually did not go she went to see her primary care provider who according to the patient have not identified and read through the note states that everything checked out okay. Nonetheless the patient has continued to have bouts where she gets very short of breath for seemingly no reason whatsoever. That happened even the same night that I saw her last time. This was after she refused transport to the hospital. With that being said she also tells me that just getting dressed to come here is quite a chore which is putting on her shirt causing her to have to stop to catch her breath. Obviously this is not normal and I feel like there is something going on. She may need a referral to be seen by cardiology ASAP. She does see her primary care provider early next week she tells me 07/11/2020 upon evaluation today patient's wounds again appear to be doing about the same she mainly has weeping of the bilateral lower extremities both are very swollen today she tells me she has been using her pumps but last time I saw her she told me she did not even have her pumps out that she had "packed them away because they had too much stuff in their house. Apparently her husband has been bringing stuff in from storage units. She then subsequently told me that she had so much stuff in her house that she has been staying up all night and did not go to bed till 7:00 this morning because she was cleaning up stuff. Nonetheless I am still unsure as to whether or not she is using the pumps based on what I am seeing I would think probably not. Nonetheless she  has been keeping the compression wraps in place that is at least something 07/25/2020 upon evaluation today patient appears to be doing well currently in regard to her legs all things considered. I do not think she is doing any worse significantly although honestly I do not think she is doing a lot better either. There does not appear to be any signs of infection which is good news although she does have a lot of weeping. She is using her pumps she tells me sporadically though she says that her husband is not a big fan of helping her to get the [place]. She also tells me is difficult because in their house. It would be easiest for her to use these as where her son is actually staying with her and living out at this point. Obviously I understand that she is having a lot of issues here and to be honest I do not really know what to say in that regard and the fact that I really cannot change the situations but I do believe she really needs to be using the lymphedema pumps to try to keep things under control here. Outside of that I think she is going to continue to have significant issues. I think the pumps coupled with good compression and elevation are the only things that are to help her at this point. 08/08/2020 upon evaluation today patient appears to be doing a little worse in regard to her legs in general. I think this may be due to some infection currently. With that being said I am going to go ahead and likely see about putting her on an antibiotic. Were also can obtain a culture today where she had some purulent drainage. 08/29/2020 upon evaluation today patient appears to  be doing about the same in regard to her bilateral lower extremities. Unfortunately she is continuing to have significant issues here with edema and she has significant lymphedema. With that being said she is really not doing anything that she is supposed to be doing as far as elevation, compression, using lymphedema pumps or  anything really for that matter. She does not use her pumps, is taken the wraps off shortly after having them put on as far as home health is concerned at least by the next day she tells me, and overall is not really elevating her legs she is telling me she has a lot to do as far as cleaning up in her house. Nonetheless this triad of noncompliance is leading to worsening not improving in regard to her legs. I had a very strong conversation with her about this today again. 09/12/2020 on evaluation today patient appears to be doing poorly with regard to her lower extremities bilaterally. Fortunately there does not appear to be any signs of active infection at this point. That is systemically. Locally there is some signs of pus coming from an area on her left leg. She has been on antibiotics recently though she is done with those currently. Nonetheless she tells me she has been having increased pain with the right leg this is extremely swollen as well. Fortunately there does not appear to be significant infection of the right leg though I think her pain is really as result of the increased swelling she has been declining the wrap on the right leg and the wounds here are getting significantly worse week by week. This obviously is not will be want to see. I discussed with the patient however if she does not use the wraps, use her pumps, and elevate her legs that she is not to get better. She has been sleeping in her motorized Hoveround which does not even allow her to elevate her legs at all. She tells me that she is also constantly "cleaning up as her husband told her that he wants to move back to [place]." Unfortunately this has been the narrative for the past several months in fact that she tells me that she has been having to clean up mass that was moved in from storage units into their home and that she does not really sleep and she never really elevates her legs. I explained to the patient today  that if she is not can I do any of this that there is really nothing that I can do to help her. 09/26/20 upon evaluation today patient appears to be doing about the same in regard to her wounds. Fortunately there is no signs of active infection at this time. No fever chills noted. She has been tolerating the dressing changes without complication which is good news. With that being said I do think that she unfortunately is still having a lot of pain but I think this is due to the swelling that really is not controlled and as I discussed with her last time she needs to be using her lymphedema pumps there is still in the box she has not even attempted to use those whatsoever. I am also not even certain she is taking her fluid pills as she states she may need to have "get a refill" she was seen with her husband at the appointment today. Electronic Signature(s) Signed: 09/26/2020 10:18:27 AM By: [name] PA-C Entered By: [name] on 09/26/2020 10:18:27 -------------------------------------------------------------------------------- Physical Exam Details  Patient Name: Date of Service: [name], [name] 09/26/2020 8:30 A M Medical Record Number: [id number] Patient Account Number: [id number] Date of Birth/Sex: Treating RN: 1951-01-02 (70 y.o. [name] Primary Care Provider: [name] Other Clinician: Referring Provider: Treating Provider/Extender: Darlen Round in Treatment: 36 Constitutional Well-nourished and well-hydrated in no acute distress. Respiratory normal breathing without difficulty. Psychiatric this patient is able to make decisions and demonstrates good insight into disease process. Alert and Oriented x 3. pleasant and cooperative. Notes Upon inspection patient's wounds again are showing signs of pretty much being open and cracked and draining along the bilateral lower extremities. There does not appear to be any signs of infection which is  great news and overall very pleased with where things stand at this point. With that being said I think that the patient is still having a tremendous amount of drainage as well as pain due to the fact that her swelling is not controlled does not appear that she is even taking her fluid pills she thinks she may have run out. She is not using her lymphedema pumps there to box and have been in touched. Subsequently she also I do not think really elevates her legs much as she should and even sleeping in the bed I am not certain she always does this. Electronic Signature(s) Signed: 09/26/2020 10:19:28 AM By: [name] PA-C Entered By: [name] on 09/26/2020 10:19:28 -------------------------------------------------------------------------------- Physician Orders Details Patient Name: Date of Service: [name]. 09/26/2020 8:30 A M Medical Record Number: [id number] Patient Account Number: [id number] Date of Birth/Sex: Treating RN: 16-Jun-1951 (70 y.o. [name] Primary Care Provider: [name] Other Clinician: Referring Provider: Treating Provider/Extender: Darlen Round in Treatment: [phone] Verbal / Phone Orders: No Diagnosis Coding ICD-10 Coding Code Description E11.622 Type 2 diabetes mellitus with other skin ulcer I89.0 Lymphedema, not elsewhere classified I87.331 Chronic venous hypertension (idiopathic) with ulcer and inflammation of right lower extremity I87.332 Chronic venous hypertension (idiopathic) with ulcer and inflammation of left lower extremity L97.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 leg with fat layer exposed L97.822 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 Bathing/ Shower/ Hygiene May shower and wash wound with soap and water. - with dressing changes, wash both legs with wash cloth and soap and water with dressing changes Other Bathing/Shower/Hygiene Orders/Instructions: - Home health to wash legs with warn soapy water and a clean wash cloth with dressing changes Edema Control - Lymphedema / SCD / Other Bilateral Lower Extremities Lymphedema Pumps. Use Lymphedema pumps on leg(s) 2-3 times a day for 45-60 minutes. If wearing any wraps or hose, do not remove them. Continue exercising as instructed. Elevate legs to the level of the heart or above for 30 minutes daily and/or when sitting, a frequency of: - especially at night Avoid standing for long periods of time. Exercise regularly Home Health No change in wound care orders this week; continue Home Health for wound care. May utilize formulary equivalent dressing for wound treatment orders unless otherwise specified. Dressing changes to be completed by [facility] on Monday / Wednesday / Friday except when patient has scheduled visit at [facility]. Other  Home Health Orders/Instructions: - [facility] Wound #61 - Lower Leg Wound Laterality: Left, Circumferential Peri-Wound Care: Zinc Oxide Ointment 30g tube (Home Health) 3 x Per Day/30 Days Discharge Instructions: Apply Zinc Oxiide to weeping areas with each dressing change Peri-Wound Care: Sween Lotion (Moisturizing lotion) (Home Health) 3 x Per Day/30 Days Discharge Instructions: Apply moisturizing lotion as directed Prim Dressing: KerraCel Ag Gelling Fiber Dressing, 4x5 in (silver alginate) (Home Health) 3 x Per Day/30 Days ary Discharge Instructions: Apply silver alginate to wound bed as instructed Secondary Dressing: Zetuvit Plus 4x4 in 3 x Per Day/30 Days Discharge Instructions: or equivalent extra absorbent pad.Apply over primary  dressing as directed. Compression Wrap: ThreePress (3 layer compression wrap) (Home Health) 3 x Per Day/30 Days Discharge Instructions: Apply three layer compression as directed. Pad bend of ankle with foam or ABD pad. Wound #64 - Foot Wound Laterality: Dorsal, Left Peri-Wound Care: Zinc Oxide Ointment 30g tube 3 x Per Day/30 Days Discharge Instructions: Apply Zinc Oxiide to weeping areas with each dressing change Peri-Wound Care: Sween Lotion (Moisturizing lotion) (Home Health) 3 x Per Day/30 Days Discharge Instructions: Apply moisturizing lotion as directed Prim Dressing: KerraCel Ag Gelling Fiber Dressing, 4x5 in (silver alginate) (Home Health) 3 x Per Day/30 Days ary Discharge Instructions: Apply silver alginate to wound bed as instructed Secondary Dressing: Zetuvit Plus 4x4 in 3 x Per Day/30 Days Discharge Instructions: or equivalent extra absorbent pad.Apply over primary dressing as directed. Compression Wrap: ThreePress (3 layer compression wrap) (Home Health) 3 x Per Day/30 Days Discharge Instructions: Apply three layer compression as directed. Pad bend of ankle with foam or ABD pad. Wound #67 - Lower Leg Wound Laterality: Right, Medial Peri-Wound Care: Zinc Oxide Ointment 30g tube 3 x Per Day/30 Days Discharge Instructions: Apply Zinc Oxiide to weeping areas with each dressing change Peri-Wound Care: Sween Lotion (Moisturizing lotion) (Home Health) 3 x Per Day/30 Days Discharge Instructions: Apply moisturizing lotion as directed Prim Dressing: KerraCel Ag Gelling Fiber Dressing, 4x5 in (silver alginate) (Home Health) 3 x Per Day/30 Days ary Discharge Instructions: Apply silver alginate to wound bed as instructed Secondary Dressing: Zetuvit Plus 4x4 in 3 x Per Day/30 Days Discharge Instructions: or equivalent extra absorbent pad.Apply over primary dressing as directed. Compression Wrap: Kerlix Roll 4.5x3.1 (in/yd) (Home Health) 3 x Per Day/30 Days Discharge Instructions: Apply  Kerlix and Coban compression as directed. Compression Wrap: Coban Self-Adherent Wrap 4x5 (in/yd) (Home Health) 3 x Per Day/30 Days Discharge Instructions: Apply over Kerlix as directed. Wound #69 - Foot Wound Laterality: Dorsal, Right Peri-Wound Care: Zinc Oxide Ointment 30g tube 3 x Per Day/30 Days Discharge Instructions: Apply Zinc Oxiide to weeping areas with each dressing change Peri-Wound Care: Sween Lotion (Moisturizing lotion) (Home Health) 3 x Per Day/30 Days Discharge Instructions: Apply moisturizing lotion as directed Prim Dressing: KerraCel Ag Gelling Fiber Dressing, 4x5 in (silver alginate) (Home Health) 3 x Per Day/30 Days ary Discharge Instructions: Apply silver alginate to wound bed as instructed Secondary Dressing: Zetuvit Plus 4x4 in 3 x Per Day/30 Days Discharge Instructions: or equivalent extra absorbent pad.Apply over primary dressing as directed. Compression Wrap: Kerlix Roll 4.5x3.1 (in/yd) (Home Health) 3 x Per Day/30 Days Discharge Instructions: Apply Kerlix and Coban compression as directed. Compression Wrap: Coban Self-Adherent Wrap 4x5 (in/yd) (Home Health) 3 x Per Day/30 Days Discharge Instructions: Apply over Kerlix as directed. Wound #70 - Lower Leg Wound Laterality: Right, Anterior Peri-Wound Care: Zinc Oxide Ointment 30g tube 3 x Per Day/30 Days Discharge Instructions:  Apply Zinc Oxiide to weeping areas with each dressing change Peri-Wound Care: Sween Lotion (Moisturizing lotion) (Home Health) 3 x Per Day/30 Days Discharge Instructions: Apply moisturizing lotion as directed Prim Dressing: KerraCel Ag Gelling Fiber Dressing, 4x5 in (silver alginate) (Home Health) 3 x Per Day/30 Days ary Discharge Instructions: Apply silver alginate to wound bed as instructed Secondary Dressing: Zetuvit Plus 4x4 in 3 x Per Day/30 Days Discharge Instructions: or equivalent extra absorbent pad.Apply over primary dressing as directed. Compression Wrap: Kerlix Roll 4.5x3.1 (in/yd)  (Home Health) 3 x Per Day/30 Days Discharge Instructions: Apply Kerlix and Coban compression as directed. Compression Wrap: Coban Self-Adherent Wrap 4x5 (in/yd) (Home Health) 3 x Per Day/30 Days Discharge Instructions: Apply over Kerlix as directed. Electronic Signature(s) Signed: 09/26/2020 5:03:59 PM By: [name] PA-C Signed: 09/26/2020 5:38:32 PM By: [name] RN, BSN Entered By: [name] on 09/26/2020 10:10:50 -------------------------------------------------------------------------------- Problem List Details Patient Name: Date of Service: [name]. 09/26/2020 8:30 A M Medical Record Number: [id number] Patient Account Number: [id number] Date of Birth/Sex: Treating RN: 06/30/1950 (70 y.o. Martyn Malay, Vaughan Basta Primary Care Provider: [name] Other Clinician: Referring Provider: Treating Provider/Extender: Darlen Round in Treatment: [phone] Active Problems ICD-10 Encounter Code Description Active Date MDM Diagnosis E11.622 Type 2 diabetes mellitus with other skin ulcer 08/05/2017 No Yes I89.0 Lymphedema, not elsewhere classified 08/05/2017 No Yes I87.331 Chronic venous hypertension (idiopathic) with ulcer and inflammation of right 08/05/2017 No Yes lower extremity I87.332 Chronic venous hypertension (idiopathic) with ulcer and inflammation of left 08/05/2017 No Yes lower extremity L97.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 leg with fat layer 08/05/2017 No Yes exposed L97.822 Non-pressure chronic ulcer of other part of left lower leg with fat layer exposed2/20/2019 No Yes L97.522 Non-pressure chronic ulcer of other part of left foot with fat layer exposed 06/01/2019 No Yes I10 Essential (primary) hypertension 08/05/2017 No Yes E66.01 Morbid (severe) obesity due to excess calories 08/05/2017 No Yes F41.8 Other specified anxiety disorders 08/05/2017 No Yes R53.1 Weakness 08/05/2017 No Yes Inactive Problems Resolved  Problems Electronic Signature(s) Signed: 09/26/2020 9:38:49 AM By: [name] PA-C Entered By: [name] on 09/26/2020 09:38:49 -------------------------------------------------------------------------------- Progress Note Details Patient Name: Date of Service: [name]. 09/26/2020 8:30 A M Medical Record Number: [id number] Patient Account Number: [id number] Date of Birth/Sex: Treating RN: 03-May-1951 (70 y.o. [name] Primary Care Provider: [name] Other Clinician: Referring Provider: Treating Provider/Extender: Darlen Round in Treatment: 164 Subjective Chief Complaint Information obtained from Patient Bilateral reoccurring LE ulcers History of Present Illness (HPI) this patient has been seen a couple of times before and returns with recurrent problems to her right and left lower extremity with swelling and weeping ulcerations due to not wearing her compression stockings which she had been advised to do during her last discharge, at the end of June 2018. During her last visit the patient had had normal arterial blood flow and her venous reflux study did not necessitate any surgical intervention. She was recommended compression and elevation and wound care. After prolonged treatment the patient was completely healed but she has been noncompliant with wearing or compressions.. She was here last week with an outpatient return visit planned but the patient came in a very poor general condition with altered mental status and was rushed to the ER on my request. With a history of hypertension, diabetes, TIA and right-sided weakness she was set up for an MRI on  her brain and cervical spine and was sent to [facility]. Getting an MRI done was very difficult but once the workup was done she was found not to have any spinal stenosis, epidural abscess or hematoma or discitis. This was radiculopathy to be treated as an outpatient and she was given  a follow-up appointment. Today she is feeling much better alert and oriented and has come to reevaluate her bilateral lower extremity lymphedema and ulceration 03/25/2017 -- she was admitted to the hospital on 03/16/2017 and discharged on 03/18/2017 with left leg cellulitis and ulceration. She was started on vancomycin and Zosyn and x-ray showed no bony involvement. She was treated for a cellulitis with IV antibiotics changed to Rocephin and Flagyl and was discharged on oral Keflex and doxycycline to complete a 7 day course. Last hemoglobin A1c was 7.1 and her other ailments including hypertension got asthma were appropriately treated. 05/06/2017 -- she is awaiting the right size of compression stockings from [place] but other than that has been doing well. ====== Old notes 70 year old patient was seen one time last October and was lost to follow-up. She has recurrent problems with weeping and ulceration of her left lower extremity and has swelling of this for several years. It has been worse for the last 2 months. Past medical history is significant for diabetes mellitus type 2, hypertension, gout, morbid obesity, depressive disorders, hiatal hernia, migraines, status post knee surgery, risk of a cholecystectomy, vaginal hysterectomy and breast biopsy. She is not a smoker. As noted before she has never had a venous duplex study and an arterial ABI study was attempted but the left lower extremity was noncompressible 10/01/2016 -- had a lower extremity venous duplex reflux evaluation which showed no evidence of deep vein reflux in the right or left lower extremity, and no evidence of great saphenous vein reflux more than 500 ms in the right or left lower extremity, and the left small saphenous vein is incompetent but no vascular consult was recommended. review of her electronic medical records noted that the ABI was checked in July 2017 where the right ABI was normal limits and the left ABI could  not be ascertained due to pain with cuff pressure but the waveforms are within normal limits. her arterial duplex study scheduled for April 27. 10/08/2016 -- the patient has various reasons for not having a compression on and for the last 3 days she has had no compression on her left lower extremity either due to pain or the lack of nursing help. She does not use her juxta lites either. 10/15/2016 -- the patient did not keep her appointment for arterial duplex study on April 27 and I have asked her to reschedule this. Her pain is out of proportion with the physical findings and she continuously fails to wear a compression wraps and cuts them off because she says she cannot tolerate the pain. She does not use her juxta lites either. 10/22/2016 -- he has rescheduled her arterial duplex study to May 21 and her pain today is a bit better. She has not been wearing her juxta lites on her right lower extremity but now understands that she needs to do this. She did tolerate the to press compression wrap on her left lower extremity 10/29/2016 --arterial duplex study is scheduled for next week and overall she has been tolerating her compression wraps and also using her juxta lites on her right lower extremity 11/05/2016 -- the right ABI was 0.95 the left was 1.03. The digit  TBI is on the right was 0.83 on the left was 0.92 and she had biphasic flow through these vessels. The impression was that of normal lower extremity arterial study. 11/12/2016 -- her pain is minimal and she is doing very well overall. 11/26/2016 -- she has got juxta lites and her insurance will not pay for additional dual layer compression stockings. She is going to order some from [place]. 05/12/2017 -- her juxta lites are very old and too big for her and these have not been helping with compression. She did get 20-30 mm compression stockings from [place] but she and her husband are unable to put these on. I believe she will benefit  from bilateral Extremit-ease, compression stockings and we will measure her for these today. 05/20/2017 -- lymphedema on the left lower extremity has increased a lot and she has a open ulceration as a result of this. The right lower extremity is looking pretty good. She has decided to by the compression stockings herself and will get reimbursed by the home health, at a later date. 05/27/2017 -- her sciatica is bothering her a lot and she thought her left leg pain was caused due to the compression wrap and hence removed it and has significant lymphedema. There is no inflammation on this left lower extremity. 06/17/17 on evaluation today patient appears to be doing very well and in fact is completely healed in regard to her ulcerations. Unfortunately however she does have continued issues with lymphedema nonetheless. We did order compression garments for her unfortunately she states that the size that she received were large although we ordered medium. Obviously this means she is not getting the optimal compression. She does not have those with her today and therefore we could not confirm and contact the company on her behalf. Nonetheless she does state that she is going to have her husband bring them by tomorrow so that we can verify and then get in touch with the company. No fevers, chills, nausea, or vomiting noted at this time. Overall patient is doing better otherwise and I'm pleased with the progress she has made. 07/01/17 on evaluation today patient appears to be doing very well in regard to her bilateral lower extremity she does not have any openings at this point which is excellent news. Overall I'm pleased with how things have progressed up to this time. Since she is doing so well we did order her compression which we are seeing her today to ensure that it fits her properly and everything is doing well in that regard and then subsequently she will be discharged. ============ Old Notes: 03/31/16  patient presents today for evaluation concerning open wounds that she has over the left medial ankle region as well as the left dorsal foot. She has previously had this occur although it has been healed for a number of months after having this for about a year prior until her hospitalization on 01/05/16. At that point in time it appears that she was admitted to the hospital for left lower extremity cellulitis and was placed on vancomycin and Zosyn at that point. Eventually upon discharge on January 15, 2016 she was placed on doxycycline at that point in time. Later on 03/27/16 positive wound culture growing Escherichia coli this was switched to amoxicillin. Currently she tells me that she is having pain radiated to be a 7 out of 10 which can be as high as 10 out of 10 with palpation and manipulation of the wound. This wound appears to be  mainly venous in nature due to the bilateral lower extremity venous stasis/lymphedema. This is definitely much worse on her left than the right side. She does have type 1 diabetes mellitus, hypertension, morbid obesity, and is wheelchair dependent.during the course of the hospital stay a blood culture was also obtained and fortunately appeared negative. She also had an x-ray of the tibia/fibula on the left which showed no acute bone abnormality. Her white blood cell count which was performed last on 03/25/16 was 7.3, hemoglobin 12.8, protein 7.1, albumin 3.0. Her urine culture appeared to be negative for any specific organisms. Patient did have a left lower extremity venous duplex evaluation for DVT . This did not include venous reflux studies but fortunately was negative for DVT Patient also had arterial studies performed which revealed that she had a . normal ABI on the right though this was unable to be performed on the left secondary to pain that she was having around the ankle region due to the wound. However it was stated on report that she had biphasic pulses and  apparently good blood flow. ========== 06/03/17 she is here in follow-up evaluation for right lower extremity ulcer. The right lower sure he has healed but she has reopened to the left medial malleolus and dorsal foot with weeping. She is waiting for new compression garments to arrive from home health, the previous compression garments were ill fitting. We will continue with compression bilaterally and follow-up in 2 weeks Readmission: 08/05/17 on evaluation today patient appears to be doing somewhat poorly in regard to her left lower extremity especially although the right lower extremity has a small area which may no longer be open. She has been having a lot of drainage from the left lower extremity however he tells me that she has not been able to use the EXTREMIT-EASE Compression at this point. She states that she did better and was able to actually apply the Juxta-Lite compression although the wound that she has is too large and therefore really does not compress which is why she cannot wear it at this point. She has no one who can help her put it on regular basis her son can sometimes but he's not able to do it most of the time. I do believe that's why she has begun to weave and have issues as she is currently yet again. No fevers, chills, nausea, or vomiting noted at this time. Patient is no evidence of dementia. 08/12/17 on evaluation today patient appears to still be doing fairly well in regard to the draining areas/weeping areas at this point. With that being said she unfortunately did go to the ER yesterday due to what was felt to be possibly a cellulitis. They place her on doxycycline by mouth and discharge her home. She definitely was not admitted. With that being said she states she has had more discomfort which has been unusual for her even compared to prior times and she's had infections.08/12/17 on evaluation today patient appears to still be doing fairly well in regard to the draining  areas/weeping areas at this point. With that being said she unfortunately did go to the ER yesterday due to what was felt to be possibly a cellulitis. They place her on doxycycline by mouth and discharge her home. She definitely was not admitted. With that being said she states she has had more discomfort which has been unusual for her even compared to prior times and she's had infections. 08/19/17 put evaluation today patient tells me that  she's been having a lot of what sounds to be neuropathic type pain in regard to her left lower extremity. She has been using over-the-counter topical bins again which some believe. That in order to apply the she actually remove the wrap we put on her last Wednesday on Thursday. Subsequently she has not had anything on compression wise since that time. The good news is a lot of the weeping areas appear to have closed at this point again I believe she would do better with compression but we are struggling to get her to actually use what she needs to at this point. No fevers, chills, nausea, or vomiting noted at this time. 09/03/17 on evaluation today patient appears to be doing okay in regard to her lower extremities in regard to the lymphedema and weeping. Fortunately she does not seem to show any signs of infection at this point she does have a little bit of weeping occurring in the right medial malleolus area. With that being said this does not appear to be too significant which is good news. 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 She has severe skin damage secondary to both of these features involving the dorsal left foot and medial left ankle and lower leg. Still has open areas in the left anterior foot. The area on the right closed over. She uses her own juxta light stockings. She does not have an arterial issue 09/16/17 on evaluation today patient actually appears to be doing excellent in  regard to her bilateral lower extremity swelling. The Juxta-Lite compression wrap seem to be doing very well for her. She has not however been using the portion that goes over her foot. Her left foot still is draining a little bit not nearly as significant as it has been in the past but still I do believe that she likely needs to utilize the full wrap including the foot portion of this will improve as well. She also has been apparently putting on a significant amount of Vaseline which also think is not helpful for her. I recommended that if she feels she needs something for moisturizer Eucerin will probably be better. 09/30/17 on evaluation today patient presents with several new open areas in regard to her left lower extremity although these appear to be minimal and mainly seem to be more moisture breakdown than anything. Fortunately she does not seem to have any evidence of infection which is great news. She has been tolerating the dressing changes without complication we are using silver alginate on the foot she has been using AB pads to have the legs and using her Juxta- Lite compression which seems to be controlling her swelling very well. Overall I'm pleased with the poor way she has progressed. 10/14/17 on evaluation today patient appears to be doing better in regard to her left lower extremity areas of weeping. She does still have some discomfort although in general this does not appear to be as macerated and I think it is progressing nicely. I do think she still needs to wear the foot portion of her Juxta- Lite in order to get the most benefit from the wrap obviously. She states she understands. Fortunately there does not appear to be evidence of infection at this time which is great news. 10/28/17 on evaluation today patient appears to be doing excellent in regard to her left lower extremity. She has just a couple areas that are still open and seem to be causing  any trouble whatsoever. For that  reason I think that she is definitely headed in the right direction the spots are very tiny compared to what we have been dealing with in the past. 11/11/17 on evaluation today patient appears to have a right lateral lower extremity ulcer that has opened since I last saw her. She states this is where the home health nurse that was coming out remove the dressing without wetting the alginate first. Nonetheless I do not know if this is indeed the case or not but more importantly we have not ordered home help to be coming out for her wounds at all. I'm unsure as to why they are coming out and we're gonna have to check on this and get things situated in that regard. With that being said we currently really do not need them to be coming out as the patient has been taking care of her leg herself without complication and no issues. In fact she was doing much better prior to nursing coming out. 11/25/17 on evaluation today patient actually appears to be doing fairly well in regard to her left lower extremity swelling. In fact she has very little area of weeping at this point there's just a small spot on the lateral portion of her right leg that still has me just a little bit more concerned as far as wanting to see this clear up before I discharge her to caring for this at home. Nonetheless overall she has made excellent progress. 12/09/17 on evaluation today patient appears to be doing rather well in regard to her lower extremity edema. She does have some weeping still in the left lower extremity although the big area we were taking care of two weeks ago actually has closed and she has another area of weeping on the left lower extremity immediately as well is the top of her foot. She does not currently have lymphedema pumps she has been wearing her compression daily on a regular basis as directed. With that being said I think she may benefit from lymphedema pumps. 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 We had a very difficult time getting and keeping this under control. 12/23/17 on evaluation today patient actually appears to be doing a little bit more poorly in regard to her bilateral lower extremities. She has been tolerating the Juxta-Lite compression wraps. Unfortunately she has two new ulcers on the right lower extremity and left lower Trinity ulceration seems to be larger. Obviously this is not good news. She has been tolerating the dressings without complication. 12/30/17 on evaluation today patient actually appears to be doing much better in regard to her bilateral lower extremity edema. She continues to have some issues with ulcerations and in fact there appears to be one spot on each leg where the wrap may have caused a little bit of a blister which is subsequently opened up at this point is given her pain. Fortunately it does not appear to be any evidence of infection which is good news. No fevers chills noted. 01/13/18 on evaluation today patient appears to be doing rather well in regard to her bilateral lower extremities. The dressings did get kind of stuck as far as the wound beds are concerned but again I think this is mainly due to the fact that she actually seems to be showing signs of  healing which is good news. She's not having as much drainage therefore she was having more of the dressing sticking. Nonetheless overall I feel like her swelling is dramatically down compared to previous. 01/20/18 on evaluation today patient unfortunately though she's doing better in most regards has a large blister on the left anterior lower extremity where she is draining quite significantly. Subsequently this is going to need debridement today in order to see what's underneath and ensure she does not continue to trapping fluid at this location. Nonetheless No  fevers, chills, nausea, or vomiting noted at this time. 01/27/18 on evaluation today patient appears to be doing rather well at this point in regard to her right lower extremity there's just a very small area that she still has open at this point. With that being said I do believe that she is tolerating the compression wraps very well in making good progress. Home health is coming out at this point to see her. Her left lower extremity on the lateral portion is actually what still mainly open and causing her some discomfort for the most part 02/10/18 on evaluation today patient actually appears to be doing very well in regard to her right lower extremity were all the ulcers appear to be completely close. In regard to the left lower extremity she does have two areas still open and some leaking from the dorsal surface of her foot but this still seems to be doing much better to me in general. 02/24/18 on evaluation today patient actually appears to be doing much better in regard to her right lower extremity this is still completely healed. Her left lower extremity is also doing much better fortunately she has no evidence of infection. The one area that is gonna require some debridement is still on the left anterior shin. Fortunately this is not hurting her as badly today. 03/10/18 on evaluation today patient appears to be doing better in some regards although she has a little bit more open area on the dorsal foot and she also has some issues on the medial portion of the left lower extremity which is actually new and somewhat deep. With that being said there fortunately does not appear to be any significant signs of infection which is good news. No fevers, chills, nausea, or vomiting noted at this time. In general her swelling seems to be doing fairly well which is good news. 03/31/18 on evaluation today patient presents for follow-up concerning her left lower extremity lymphedema. Unfortunately she has been  doing a little bit more poorly since I last saw her in regard to the amount of weeping that she is experiencing. She's also having some increased pain in the anterior shin location. Unfortunately I do not feel like the patient is making such good progress at this point a few weeks back she was definitely doing much better. 04/07/18 on evaluation today patient actually appears to be showing some signs of improvement as far as the left lower extremity is concerned. She has been tolerating the dressing changes and it does appear that the Drawtex did better for her. With that being said unfortunately home health is stating that they cannot obtain the Drawtex going forward. Nonetheless we're gonna have to check and see what they may be able to get the alginate they were using was getting stuck in causing new areas of skin being pulled all that with and subsequently weep and calls her to worsen overall this is the first time we've seen improvement at this  time. 04/14/18 on evaluation today patient actually appears to be doing rather well at this point there does not appear to be any evidence of infection at this time and she is actually doing excellent in regard to the weeping in fact she almost has no openings remaining even compared to just last week this is a dramatic improvement. No fevers chills noted 04/21/18 evaluation today patient actually appears to be doing very well. She in fact is has a small area on the posterior lower extremity location and she has a small area on the dorsal surface of her foot that are still open both of which are very close to closing. We're hoping this will be close shortly. She brought her Juxta-Lite wrap with her today hoping that would be able to put her in it unfortunately I don't think were quite at that point yet but we're getting closer. 04/28/18 upon evaluation today patient actually appears to be doing excellent in regard to her left lower extremity ulcer. In fact  the region on the posterior lower extremity actually is much smaller than previously noted. Overall I'm very happy with the progress she has made. She again did bring her Juxta-Lite although we're not quite ready for that yet. 05/11/18 upon evaluation today patient actually appears to be doing in general fairly well in regard to her left lower Trinity. The swelling is very well controlled. With that being said she has a new area on the left anterior lower extremity as well as between the first and second toes of her left foot that was not present during the last evaluation. The region of her posterior left lower extremity actually appears to be almost completely healed. T be honest I'm very pleased with o the way that stands. Nonetheless I do believe that the lotion may be keeping the area to moist as far as her legs are concerned subsequently I'm gonna consider discontinuing that today. 05/26/18 on evaluation today patient appears to be doing rather well in regard to her left lower should be ulcers. In fact everything appears to be close except for a very small area on the left posterior lower extremity. Fortunately there does not appear to be any evidence of infection at this time. Overall very pleased with her progress. 06/02/18 and evaluation today patient actually appears to be doing very well in regard to her lower extremity ulcers. She has one small area that still continues to weep that I think may benefit her being able to justify lotion and user Juxta-Lite wraps versus continued to wrap her. Nonetheless I think this is something we can definitely look into at this point. 06/23/18 on evaluation today patient unfortunately has openings of her bilateral lower extremities. In general she seems to be doing much worse than when I last saw her just as far as her overall health standpoint is concerned. She states that her discomfort is mainly due to neuropathy she's not having any other  issues otherwise. No fevers, chills, nausea, or vomiting noted at this time. 06/30/18 on evaluation today patient actually appears to be doing a little worse in regard to her right lower extremity her left lower extremity of doing fairly well. Fortunately there is no sign of infection at this time. She has been tolerating the dressing changes without complication. Home health did not come out like they were supposed to for the appropriate wrap changes. They stated that they never received the orders from [place] which were fax. Nonetheless we will send a copy  of the orders with the patient today as well. 07/07/18 on evaluation today patient appears to be doing much better in regard to lower extremities. She still has several openings bilaterally although since I last saw her her legs did show obvious signs of infection when she later saw her nurse. Subsequently a culture was obtained and she is been placed on Bactrim and Keflex. Fortunately things seem to be looking much better it does appear she likely had an infection. Again last week we'd even discussed it but again there really was not any obvious sign that she had infection therefore we held off on the antibiotics. Nonetheless I'm glad she's doing better today. 07/14/18 on evaluation today patient appears to be doing much better regarding her bilateral lower Trinity's. In fact on the right lower for me there's nothing open at this point there are some dry skin areas at the sites where she had infection. Fortunately there is no evidence of systemic infection which is excellent news. No fevers chills noted 07/21/18 on evaluation today patient actually appears to be doing much better in regard to her left lower extremity ulcers. She is making good progress and overall I feel like she's improving each time I see her. She's having no pain I do feel like the infection is completely resolved which is excellent news. No fevers, chills, nausea, or vomiting noted at  this time. 07/28/18 on evaluation today patient appears to be doing very well in regard to her left lower [employer]. Everything seems to be showing signs of improvement which is excellent news. Overall very pleased with the progress that has been made. Fortunately there's no evidence of active infection at this time also excellent news. 08/04/18 on evaluation today patient appears to be doing more poorly in regard to her bilateral lower extremities. She has two new areas open up on the right and these were completely closed as of last week. She still has the two spots on the left which in my pinion seem to be doing better. Fortunately there's no evidence of infection again at this point. 08/11/18 on evaluation today patient actually appears to be doing very well in regard to her bilateral lower Trinity wounds that all seem to be doing better and are measures smaller today. Fortunately there's no signs of infection. No fevers, chills, nausea, or vomiting noted at this time. 08/18/18 on evaluation today patient actually appears to be doing about the same inverter bilateral lower extremities. She continues to have areas that blistering open as was drain that fortunately nothing too significant. Overall I feel like Drawtex may have done better for her however compared to the collagen. 08/25/18 on evaluation today patient appears to be doing a little bit more poorly today even compared to last time I saw her. Again I'm not exactly sure why she's making worse progress over the past several weeks. I'm beginning to wonder if there is some kind of underlying low level infection causing this issue. I did actually take a culture from the left anterior lower extremity but it was a new wound draining quite a bit at this point. Unfortunately she also seems to be having more pain which is what also makes me worried about the possibility of infection. This is despite never erythema noted at this point. 09/01/18 on  evaluation today patient actually appears to be doing a little worse even compared to last week in regard to bilateral lower extremities. She did go to the hospital on the 16th was  given a dose of IV Zosyn and then discharged with a recommendation to continue with the Bactrim that I previously prescribed for her. Nonetheless she is still having a lot of discomfort she tells me as well at this time. This is definitely unfortunate. No fevers, chills, nausea, or vomiting noted at this time. 09/08/18 on evaluation today patient's bilateral lower extremities actually appear to be shown signs of improvement which is good news. Fortunately there does not appear to be any signs of active infection I think the anabiotic is helping in this regard. Overall I'm very pleased with how she is progressing. 09/15/18 patient was actually seen in ER yesterday due to her legs as well unfortunately. She states she's been having a lot of pain and discomfort as well as a lot of drainage. Upon inspection today the patient does have a lot of swelling and drainage I feel like this is more related to lymphedema and poor fluid control than it is to infection based on what I'm seeing. The physician in the emergency department also doubted that the patient was having a significant infection nonetheless I see no evidence of infection obvious at this point although I do see evidence of poor fluid control. She still not using a compression pumps, she is not elevating due to her lift chair as well as her hospital bed being broken, and she really is not keeping her legs up as much as they should be and also has been taking off her wraps. All this combined I think has led to poor fluid control and to be honest she may be somewhat volume overloaded in general as well. I recommend that she may need to contact your physician to see if a prescription for a diuretic would be beneficial in their opinion. As long as this is safe I think it would  likely help her. 09/29/18 on evaluation today patient's left lower extremity actually appears to be doing quite a bit better. At least compared to last time that I saw her. She still has a large area where she is draining from but there's a lot of new skin speckled trout and in fact there's more new skin that there are open areas of weeping and drainage at this point. This is good news. With regard to the right lower extremity this is doing much better with the only open area that I really see being a dry spot on the right lateral ankle currently. Fortunately there's no signs of active infection at this time which is good news. No fevers, chills, nausea, or vomiting noted at this time. The patient seems somewhat stressed and overwhelmed during the visit today she was very lethargic as such. She does and she is not taking any pain medications at this point. Apparently according to her husband are also in the process of moving which is probably taking its toll on her as well. 10/06/18 on evaluation today patient appears to be doing rather well in regard to her lower extremities compared to last evaluation. Fortunately there's no signs of active infection. She tells me she did have an appointment with her primary. Nonetheless he was concerned that the wounds were somewhat deep based on pictures but we never actually saw her legs. She states that he had her somewhat worried due to the fact that she was fearing now that she was [place] have to have an amputation. With that being said based on what I'm seeing check she looks better this week that she has the  last two times I've seen her with much less drainage I'm actually pleased in this regard. That doesn't mean that she's out of the water but again I do not think what the point of talking about education at all in regard to her leg. She is very happy to hear this. She is also not having as much pain as she was having last week. 10/13/18 unfortunately on  evaluation today patient still continues to have a significant amount of drainage she's not letting home health actually apply the compression dressings at this point. She's trying to use of Juxta-Lite of the top of Kerlex and the second layer of the three layer compression wrap. With that being said she just does not seem to be making as good a progress as I would expect if she was having the compression applied and in place on a regular basis. No fevers, chills, nausea, or vomiting noted at this time. 10/20/18 on evaluation today patient appears to be doing a little better in regard to her bilateral lower extremity ulcers. In fact the right lower extremity seems to be healed she doesn't even have any openings at this point left lower extremity though still somewhat macerated seems to be showing signs of new skin growth at multiple locations throughout. Fortunately there's no evidence of active infection at this time. No fevers, chills, nausea, or vomiting noted at this time. 10/27/18 on evaluation today patient appears to be doing much better in regard to her left lower Trinity ulcer. She's been tolerating the laptop complication and has minimal drainage noted at this point. Fortunately there's no signs of active infection at this time. No fevers, chills, nausea, or vomiting noted at this time. 11/03/18 on evaluation today patient actually appears to be doing excellent in regard to her left lower extremity. She is having very little drainage at this point there does not appear to be any significant signs of infection overall very pleased with how things have gone. She is likewise extremely pleased still and seems to be making wonderful progress week to week. I do believe antibiotics were helpful for her. Her primary care provider did place on amateur clean since I last saw her. 11/17/18 on evaluation today patient appears to be doing worse in regard to her bilateral lower extremities at this point. She is  been tolerating the dressing changes without complication. With that being said she typically takes the Coban off fairly quickly upon arriving home even after being seen here in the clinic and does not allow home health reapply command as part of the dressing at home. Therefore she said no compression essentially since I last saw her as best I can tell. With that being said I think it shows and how much swelling she has in the open wounds that are noted at this point. Fortunately there's no signs of infection but unfortunately if she doesn't get this under control I think she will end up with infection and more significant issues. 11/24/18 on evaluation today patient actually appears to be doing somewhat better in regard to her bilateral lower extremities. 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 Nonetheless that is doing better currently so she should be able to attempt reinitiating the lymphedema pumps at this time. No fevers, chills, nausea, or vomiting noted at this time. 12/01/18  upon evaluation today patient's left lower extremity appears to be doing quite well unfortunately her right lower extremity is not doing nearly as well. She has been tolerating the dressing changes without complication unfortunately she did not keep a wrap on the right at this time. Nonetheless I believe this has led to increased swelling and weeping in the world is actually much larger than during the last evaluation with her. 12/08/18 on evaluation today patient appears to be doing about the same at this point in regard to her right lower extremity. There is some more palatable to touch I'm concerned about the possibility of there being some infection although I think the main issue is she's not keeping her compression wrap on which in turn is not allowing this area to heal  appropriately. 12/22/18 on evaluation today patient appears to be doing better in regard to left lower extremity unfortunately significantly worse in regard to the right lower extremity. The areas of blistering and necrotic superficial tissue have spread and again this does not really appear to be signs of infection and all she just doesn't seem to be doing nearly as well is what she has been in the past. Overall I feel like the Augmentin did absolutely nothing for her she doesn't seem to have any infection again I really didn't think so last time either is more of a potential preventative measure and hoping that this would make some difference but I think the main issue is she's not wearing her compression. She tells me she cannot wear the [place] been we put on she takes it off pretty much upon getting home. Subsequently she worshiped Juxta-Lite when I questioned her about how often she wears it this is no more than three hours a day obviously that leaves 21 hours that she has no compression and this is obviously not doing well for her. Overall I'm concerned that if things continue to worsen she is at great risk of both infection as well as losing her leg. 01/05/19 on evaluation today patient appears to be doing well in regard to her left lower extremity which he is allowing [place] to wrap and not so well with regard to her right lower extremity which she is not allowing [place] to really wrap and keep the wrap on. She states that it hurts too badly whenever it's wrapped and she ends up having to take it off. She's been using the Juxta-Lite she tells me up to six hours a day although I question whether or not that's really been the case to be honest. Previously she told me three hours today nonetheless obviously the legs as long as the wrap is doing great when she is not is doing much more poorly. 01/12/2019 on evaluation today patient actually appears to be doing a little better in my opinion with regard to her  right lower extremity ulcer. She has a small open area on the left lower extremity unfortunately but again this I think is part of the normal fluctuation of what she is going to have to expect with regard to her legs especially when she is not using her lymphedema pumps on a regular basis. Subsequently based on what I am seeing today I think that she does seem to be doing slightly better with regard to her right lower extremity she did see her primary care provider on Monday they felt she had an infection and placed her on 2 antibiotics. Both Cipro and clindamycin. Subsequently again she seems possibly to  be doing a little bit better in regards to the right lower extremity she also tells me however she has been wearing the compression wrap over the past week since I spoke with her as well that is a Kerlix and Coban wrap on the right. No fevers, chills, nausea, vomiting, or diarrhea. 01/19/2019 on evaluation today patient appears to be doing better with regard to her bilateral lower extremities especially the right. I feel like the compression has been beneficial for her which is great news. She did get a call from her primary care provider on her way here today telling her that she did have methicillin- resistant Staphylococcus aureus and he was calling in a couple new antibiotics for her including a ointment to be applied she tells me 3 times a day. With that being said this sounds like likely to be Bactroban which I think could be applied with each dressing/wrap change but I would not be able to accommodate her applying this 3 times a day. She is in agreement with the least doing this we will add that to her orders today. 01/26/2019 on evaluation today patient actually appears to be doing much better with regard to her right lower extremity. Her left lower extremity is also doing quite well all things considering. Fortunately there is no evidence of active infection at this time. No fevers, chills,  nausea, vomiting, or diarrhea. 02/02/2019 on evaluation today patient appears to be doing much better compared to her last evaluation. Little by little off like her right leg is returning more towards normal. There does not appear to be any signs of active infection and overall she seems to be doing quite well which is great news. I am very pleased in this regard. No fevers, chills, nausea, vomiting, or diarrhea. 02/09/2019 upon evaluation today patient appears to be doing better with regard to her bilateral lower extremities. She has been tolerating the dressing changes without complication. Fortunately there is no signs of active infection at this time. No fevers, chills, nausea, vomiting, or diarrhea. 02/23/2019 on evaluation today patient actually appears to be doing quite well with regard to her bilateral lower extremities. She has been tolerating the dressing changes without complication. She is even used her pumps one time and states that she really felt like it felt good. With that being said she seems to be in good spirits and her legs appear to be doing excellent. 03/09/2019 on evaluation today patient appears to be doing well with regard to her right lower extremity there are no open wounds at this time she is having some discomfort but I feel like this is more neuropathy than anything. With regard to her left lower extremity she had several areas scattered around that she does have some weeping and drainage from but again overall she does not appear to be having any significant issues and no evidence of infection at this time which is good news. 03/23/2019 on evaluation today patient appears to be doing well with regard to her right lower extremity which she tells me is still close she is using her juxta light here. Her left lower extremity she mainly just has an area on the foot which is still slightly draining although this also is doing great. Overall very pleased at this time. 04/06/2019  patient appears to be doing a little bit worse in regard to her left lower extremity upon evaluation today. She feels like this could be becoming infected again which she had issues with previous. Fortunately  there is no signs of systemic infection but again this is always a struggle with her with her legs she will go from doing well to not so well in a very short amount of time. 04/20/2019 on evaluation today patient actually appears to be doing quite well with regard to her right lower extremity I do not see any signs of active infection at this time. Fortunately there is no fever chills noted. She is still taking the antibiotics which I prescribed for her at this point. In regard to the left lower extremity I do feel like some of these areas are better although again she still is having weeping from several locations at this time. 04/27/2019 on evaluation today patient appears to be doing about the same if not slightly worse in regard to her left lower extremity ulcers. She tells me when questioned that she has been sleeping in her Hoveround chair in fact she tells me she falls asleep without even knowing it. I think she is spending a whole lot of time in the chair and less time walking and moving around which is not good for her legs either. On top of that she is in a seated position which is also the worst position she is not really elevating her legs and she is also not using her lymphedema pumps. All this is good to contribute to worsening of her condition in general. 05/18/2019 on evaluation today patient appears to be doing well with regard to her lower extremity on the right in fact this is showing no signs of any open wounds at this time. On the left she is continuing to have issues with areas that do drain. Some of the regions have healed and there are couple areas that have reopened. She did go to the ER per the patient according to recommendations from the home health nurse due to what she  was seen when she came out on 05/13/2019. Subsequently she felt like the patient needed to go to the hospital due to the fact that again she was having "milky white discharge" from her leg. Nonetheless she had and then was placed on doxycycline and subsequently seems to be doing better. 06/01/2019 upon evaluation today patient appears to be doing really in my opinion about the same. I do not see any signs of active infection which is good news. Overall she still has wounds over the bilateral lower extremities she has reopened on the right but this appears to be more of a crack where there is weeping/edema coming from the region. I do not see any evidence of infection at either site based on what I visualized today. 07/13/2019 upon evaluation today patient appears to be doing a little worse compared to last time I saw her. She since has been in the hospital from 06/21/2019 through 06/29/2019. This was secondary to having Covid. During that time they did apply lotion to her legs which unfortunately has caused her to develop a myriad of open wounds on her lower extremities. Her legs do appear to be doing better as far as the overall appearance is concerned but nonetheless she does have more open and weeping areas. 07/27/2019 upon evaluation today patient appears to be doing more poorly to be honest in regard to her left lower extremity in particular. There is no signs of systemic infection although I do believe she may have local infection. She notes she has been having a lot of blue/green drainage which is consistent potentially with Pseudomonas. That may  be something that we need to consider here as well. The doxycycline does not seem to have been helping. 08/03/2019 upon evaluation today patient appears to be doing a little better in my opinion compared to last week's evaluation. Her culture I did review today and she is on appropriate medications to help treat the Enterobacter that was noted. Overall I  feel like that is good news. With that being said she is unfortunately continuing to have a lot of drainage and though it is doing better I still think she has a long ways to go to get things dried up in general. Fortunately there is no signs of systemic infection. 08/10/2019 upon evaluation today patient appears to be doing may be slightly better in regard to her left lower extremity the right lower extremity is doing much better. Fortunately there is no signs of infection right now which is good news. No fevers, chills, nausea, vomiting, or diarrhea. 08/24/2019 on evaluation today patient appears to be doing slightly better in regard to her lower extremities. The left lower extremity seems to be healed the right lower extremity is doing better though not completely healed as far as the openings are concerned. She has some generalized issues here with edema and weeping secondary to her lymphedema though again I do believe this is little bit drier compared to prior weeks evaluations. In general I am very pleased with how things seem to be progressing. No fevers, chills, nausea, vomiting, or diarrhea. 08/31/2019 upon evaluation today patient actually seems to making some progress here with regard to the left lower extremity in particular. She has been tolerating the dressing changes without complication. Fortunately there is no signs of active infection at this time. No fevers, chills, nausea, vomiting, or diarrhea. She did see Dr. Doren Custard and he did note that she did have a issue with the left great saphenous vein and the small saphenous vein in the leg. With that being said he was concerned about the possibility of laser ablation not being extremely successful. He also mentioned a small risk of DVT associated with the procedure. However if the wounds do not continue to improve he stated that that would probably be the way to go. Fortunately the patient's legs do seem to be doing much better. 09/07/2019  upon evaluation today patient appears to be doing better with regard to her lower extremities. She has been tolerating the dressing changes without complication. With that being said she is showing signs of improvement and overall very pleased. There are some areas on her leg that I think we do need to debride we discussed this last week the patient is in agreement with doing that as long as it does not hurt too badly. 09/14/2019 upon evaluation today patient appears to be doing decently well with regard to her left lower extremity. She is not having near as much weeping as she has had in the past things seem to be drying up which is good news. There is no signs of active infection at this time. 09/21/19 upon evaluation today patient appears to be doing better in regard overall to her bilateral lower extremities. She again has less open than she did previous and each week I feel like this is getting better. Fortunately there is no signs of active infection at this time. No fevers, chills, nausea, vomiting, or diarrhea. 09/28/2019 upon evaluation today patient actually appears to be showing signs of improvement with regard to her left lower extremity. Unfortunately the right medial lower  extremity around the ankle region has reopened to some degree but this appears to be minimal still which is good news. There is no signs of active infection at this time which is also good news. 10/12/2019 upon evaluation today patient appears to be doing okay with regard to her bilateral lower extremities today. The right is a little bit worse then last evaluation 2 weeks ago. The left is actually doing a little better in my opinion. Overall there is no signs of active infection at this time that I see. Obviously that something we have to keep a close eye on she is very prone to this with the significant and multiple openings that she has over the bilateral lower extremities. 10/19/2019 upon evaluation today patient appears  to be doing about the best that I have seen her in quite some time. She has been tolerating the dressing changes without complication. There does not appear to be any signs of active infection and overall I am extremely happy with the way her legs appeared. She is drying up quite nicely and overall is having less pain. 11/09/2019 upon evaluation today patient appears to be doing better in regard to her wounds. She seems to be drying up more and more each time I see her this is just taking a very long time. Fortunately there is no signs of active infection at this time. 11/23/2019 upon evaluation today patient actually appears to be doing excellent in regard to her lower extremities at this point compared to where she has been. Fortunately there is no signs of active infection at this time. She did go to the hospital last week for nausea and vomiting completely unrelated to her wounds. Fortunately she is doing better she was given some Reglan and got better. She had associated abdominal pain but they never found out what was going on. 12/07/2019 upon evaluation today patient appears to be doing well for the most part in regard to her legs. She unfortunately has not been keeping the [place] portion of her wraps on therefore the compression has not really been sufficient for what it is supposed to be. Nonetheless she tells me that it just hurt too bad therefore she removed it. 12/21/2019 upon evaluation today patient actually appears to be doing quite well with regard to her legs. I do feel like she has been making progress which is great news and overall there is no signs of active infection at this time. No fevers, chills, nausea, vomiting, or diarrhea. 01/04/2020 upon evaluation today patient presents for follow-up concerning her lower extremity edema bilaterally. She still has open wounds she has not been using her lymphedema pumps. She is also not been utilizing her compression wraps appropriately she tends  to unwrap them, take them off, or states that they hurt. Obviously the reason they hurt is because her legs start to swell but the issue is if she would use her compression/lymphedema pumps regularly she would not swell and she would have the pain. Nonetheless she has not even picked them up once honestly over the past several months and may be even as much as in the past year based on my opinion and what have seen. She tells me today that after last week when I talked about this with her specifically actually that was 2 weeks ago that she "forgot". 8//21 on evaluation today patient appears to be doing a little better in regard to her legs bilaterally. Fortunately there is no signs of active infection at this  time. She tells me that she used her lymphedema pumps all of one time over the past 2 weeks since I last saw her. She tells me that she has been too busy in order to continue to use these. 02/01/2020 on evaluation today patient appears to be doing some better in regard to her wounds in general in her legs. We felt the right was healed although is not completely it does appear to be doing better she tells me she has been using her lymphedema pumps that she has had this six times since I last saw her. Obviously the more she does that the better she would do my opinion 02/15/2020 upon evaluation today patient appears to be doing about the same in regard to her legs. She tells me that she is pumping I'm still not sure how much she does to be perfectly honest. However even if she does a little bit here and there I guess that is better than nothing. Fortunately there is no sign of active infection at this time which is great news. No fevers, chills, nausea, vomiting, or diarrhea. 02/29/2020 on evaluation today patient actually appears to be doing quite well all things considered this week. She has been tolerating the dressing changes without complication. Fortunately there is no signs of active infection  at this time. No fevers, chills, nausea, vomiting, or diarrhea. 03/14/2020 upon evaluation today patient appears to be doing really about the same in regard to her legs. There is no signs of improvement overall and she as noted from home health does not appear to be elevating her legs he can get into her lift chair. There is too much stuff piled up on it the patient tells me. She also tells me she cannot really use her pumps effectively due to the fact that she cannot have any space to get them on. Finally she is also not really elevating her legs because she is not sleeping in her bed she is sleeping in her chair currently and again overall I think everything that she is done in combination has been exactly the wrong thing for what she needs for her legs. 04/04/2020 upon evaluation today patient appears to be doing well at this time with regard to her legs. She is actually been pumping, keeping her wraps on, and to be honest she seems to be doing dramatically better the right leg is excellent the left leg is also excellent and measuring much smaller than previous. 04/18/2020 upon evaluation today patient actually is continue to make good progress in regard to her lower extremities bilaterally. Everything is improving and less wet that has been in the past overall I am extremely pleased with where things stand and I think that she is making great progress. The patient tells me she still continue to use her compression pumps 05/02/2020 on evaluation today patient appears to be doing well at this time in regard to her left leg which is showing signs of drying up. With that being said she does have a lot of lymphedema type crusty skin around the toes of her left foot and the right medial ankle which has opened at this point. Fortunately there is no signs of active infection systemically at this point or even locally for that matter. 05/23/2020 on evaluation today patient appears to be doing well with regard  to her lower extremities. Fortunately there is no signs of active infection at this time. No fever chills noted. She has been very depressed however she tells  me. 06/06/2020 patient came in today for evaluation in regard to her bilateral lower extremity ulcerations. With that being said she came in feeling okay and actually laughing and joking around with the staff checking her in. Subsequently however she had a coughing spell and following the coughing spell it was a dramatic conversion from being jovial and joking around to being extremely short of breath her vital signs actually dropped in regard to her blood pressure from around 175 to down around 329 for systolic and from around 96 diastolic down to around 70. With that being said she also accompanied this with an increase in her respiratory rate which was also quite significant. Nonetheless I actually upon going into see her was extremely worried we called EMS to have her transported to the ER for further evaluation and treatment. She continued until EMS got here to be extremely short of breath even on 6 L of oxygen. Her oxygen saturation did come up to 99% but overall it was only 95 before which was not terrible she did feel like the oxygen helped her feel somewhat better however. She has a history of asthma but again even the coughing spell really should not have done this degree of alteration in her demeanor from where she was just before to after the coughing spell. She tells [place] however she has been feeling somewhat abnormal since Friday although she could not really pinpoint exactly what was going on. 06/27/2020 plan evaluation today patient appears to be doing okay in regard to her leg ulcers. She is really not showing a lot of improvement to be honest and she still has a lot of weeping. With that being said after I last saw her we called EMS to take her to the hospital she actually did not go she went to see her primary care provider who  according to the patient have not identified and read through the note states that everything checked out okay. Nonetheless the patient has continued to have bouts where she gets very short of breath for seemingly no reason whatsoever. That happened even the same night that I saw her last time. This was after she refused transport to the hospital. With that being said she also tells me that just getting dressed to come here is quite a chore which is putting on her shirt causing her to have to stop to catch her breath. Obviously this is not normal and I feel like there is something going on. She may need a referral to be seen by cardiology ASAP. She does see her primary care provider early next week she tells me 07/11/2020 upon evaluation today patient's wounds again appear to be doing about the same she mainly has weeping of the bilateral lower extremities both are very swollen today she tells me she has been using her pumps but last time I saw her she told me she did not even have her pumps out that she had "packed them away because they had too much stuff in their house. Apparently her husband has been bringing stuff in from storage units. She then subsequently told me that she had so much stuff in her house that she has been staying up all night and did not go to bed till 7:00 this morning because she was cleaning up stuff. Nonetheless I am still unsure as to whether or not she is using the pumps based on what I am seeing I would think probably not. Nonetheless she has been keeping the compression  wraps in place that is at least something 07/25/2020 upon evaluation today patient appears to be doing well currently in regard to her legs all things considered. I do not think she is doing any worse significantly although honestly I do not think she is doing a lot better either. There does not appear to be any signs of infection which is good news although she does have a lot of weeping. She is using her  pumps she tells me sporadically though she says that her husband is not a big fan of helping her to get the [place]. She also tells me is difficult because in their house. It would be easiest for her to use these as where her son is actually staying with her and living out at this point. Obviously I understand that she is having a lot of issues here and to be honest I do not really know what to say in that regard and the fact that I really cannot change the situations but I do believe she really needs to be using the lymphedema pumps to try to keep things under control here. Outside of that I think she is going to continue to have significant issues. I think the pumps coupled with good compression and elevation are the only things that are to help her at this point. 08/08/2020 upon evaluation today patient appears to be doing a little worse in regard to her legs in general. I think this may be due to some infection currently. With that being said I am going to go ahead and likely see about putting her on an antibiotic. Were also can obtain a culture today where she had some purulent drainage. 08/29/2020 upon evaluation today patient appears to be doing about the same in regard to her bilateral lower extremities. Unfortunately she is continuing to have significant issues here with edema and she has significant lymphedema. With that being said she is really not doing anything that she is supposed to be doing as far as elevation, compression, using lymphedema pumps or anything really for that matter. She does not use her pumps, is taken the wraps off shortly after having them put on as far as home health is concerned at least by the next day she tells me, and overall is not really elevating her legs she is telling me she has a lot to do as far as cleaning up in her house. Nonetheless this triad of noncompliance is leading to worsening not improving in regard to her legs. I had a very strong conversation with  her about this today again. 09/12/2020 on evaluation today patient appears to be doing poorly with regard to her lower extremities bilaterally. Fortunately there does not appear to be any signs of active infection at this point. That is systemically. Locally there is some signs of pus coming from an area on her left leg. She has been on antibiotics recently though she is done with those currently. Nonetheless she tells me she has been having increased pain with the right leg this is extremely swollen as well. Fortunately there does not appear to be significant infection of the right leg though I think her pain is really as result of the increased swelling she has been declining the wrap on the right leg and the wounds here are getting significantly worse week by week. This obviously is not will be want to see. I discussed with the patient however if she does not use the wraps, use her pumps, and elevate  her legs that she is not to get better. She has been sleeping in her motorized Hoveround which does not even allow her to elevate her legs at all. She tells me that she is also constantly "cleaning up as her husband told her that he wants to move back to [place]." Unfortunately this has been the narrative for the past several months in fact that she tells me that she has been having to clean up mass that was moved in from storage units into their home and that she does not really sleep and she never really elevates her legs. I explained to the patient today that if she is not can I do any of this that there is really nothing that I can do to help her. 09/26/20 upon evaluation today patient appears to be doing about the same in regard to her wounds. Fortunately there is no signs of active infection at this time. No fever chills noted. She has been tolerating the dressing changes without complication which is good news. With that being said I do think that she unfortunately is still having a lot of  pain but I think this is due to the swelling that really is not controlled and as I discussed with her last time she needs to be using her lymphedema pumps there is still in the box she has not even attempted to use those whatsoever. I am also not even certain she is taking her fluid pills as she states she may need to have "get a refill" she was seen with her husband at the appointment today. Objective Constitutional Well-nourished and well-hydrated in no acute distress. Vitals Time Taken: 9:07 AM, Height: 62 in, Weight: 335 lbs, BMI: 61.3, Temperature: 97.5 F, Pulse: 79 bpm, Respiratory Rate: 18 breaths/min, Blood Pressure: 160/70 mmHg. Respiratory normal breathing without difficulty. Psychiatric this patient is able to make decisions and demonstrates good insight into disease process. Alert and Oriented x 3. pleasant and cooperative. General Notes: Upon inspection patient's wounds again are showing signs of pretty much being open and cracked and draining along the bilateral lower extremities. There does not appear to be any signs of infection which is great news and overall very pleased with where things stand at this point. With that being said I think that the patient is still having a tremendous amount of drainage as well as pain due to the fact that her swelling is not controlled does not appear that she is even taking her fluid pills she thinks she may have run out. She is not using her lymphedema pumps there to box and have been in touched. Subsequently she also I do not think really elevates her legs much as she should and even sleeping in the bed I am not certain she always does this. Integumentary (Hair, Skin) Wound #61 status is Open. Original cause of wound was Gradually Appeared. The date acquired was: 04/20/2019. The wound has been in treatment 75 weeks. The wound is located on the Left,Circumferential Lower Leg. The wound measures 14cm length x 14cm width x 0.1cm depth;  153.938cm^2 area and 15.394cm^3 volume. There is Fat Layer (Subcutaneous Tissue) exposed. There is a medium amount of serous drainage noted. The wound margin is indistinct and nonvisible. There is large (67-100%) pink, pale granulation within the wound bed. There is no necrotic tissue within the wound bed. Wound #64 status is Open. Original cause of wound was Gradually Appeared. The date acquired was: 06/01/2019. The wound has been in treatment 69  weeks. The wound is located on the Left,Dorsal Foot. The wound measures 6cm length x 5cm width x 0.1cm depth; 23.562cm^2 area and 2.356cm^3 volume. There is Fat Layer (Subcutaneous Tissue) exposed. There is no tunneling or undermining noted. There is a medium amount of serous drainage noted. The wound margin is flat and intact. There is large (67-100%) pink, pale granulation within the wound bed. There is no necrotic tissue within the wound bed. Wound #67 status is Open. Original cause of wound was Gradually Appeared. The date acquired was: 05/02/2020. The wound has been in treatment 21 weeks. The wound is located on the Right,Medial Lower Leg. The wound measures 10.5cm length x 7cm width x 0.1cm depth; 57.727cm^2 area and 5.773cm^3 volume. There is Fat Layer (Subcutaneous Tissue) exposed. There is no tunneling or undermining noted. There is a medium amount of serous drainage noted. The wound margin is thickened. There is large (67-100%) pink, pale granulation within the wound bed. There is no necrotic tissue within the wound bed. Wound #69 status is Open. Original cause of wound was Gradually Appeared. The date acquired was: 07/25/2020. The wound has been in treatment 9 weeks. The wound is located on the Right,Dorsal Foot. The wound measures 0.7cm length x 1.4cm width x 0.1cm depth; 0.77cm^2 area and 0.077cm^3 volume. There is Fat Layer (Subcutaneous Tissue) exposed. There is no tunneling or undermining noted. There is a medium amount of serosanguineous  drainage noted. The wound margin is distinct with the outline attached to the wound base. There is large (67-100%) pink, pale granulation within the wound bed. There is no necrotic tissue within the wound bed. Wound #70 status is Open. Original cause of wound was Gradually Appeared. The date acquired was: 08/29/2020. The wound has been in treatment 4 weeks. The wound is located on the Right,Anterior Lower Leg. The wound measures 9cm length x 6.1cm width x 0.1cm depth; 43.118cm^2 area and 4.312cm^3 volume. There is Fat Layer (Subcutaneous Tissue) exposed. There is no tunneling or undermining noted. There is a medium amount of serosanguineous drainage noted. The wound margin is flat and intact. There is large (67-100%) pink granulation within the wound bed. There is no necrotic tissue within the wound bed. 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 There was a Three Layer Compression Therapy Procedure by Rhae Hammock, RN. Post procedure Diagnosis Wound #61: Same as Pre-Procedure Plan Follow-up Appointments: Return Appointment in 2 weeks. Bathing/ Shower/ Hygiene: May shower and wash wound with soap and water. - with dressing changes, wash both  legs with wash cloth and soap and water with dressing changes Other Bathing/Shower/Hygiene Orders/Instructions: - Home health to wash legs with warn soapy water and a clean wash cloth with dressing changes Edema Control - Lymphedema / SCD / Other: Lymphedema Pumps. Use Lymphedema pumps on leg(s) 2-3 times a day for 45-60 minutes. If wearing any wraps or  hose, do not remove them. Continue exercising as instructed. Elevate legs to the level of the heart or above for 30 minutes daily and/or when sitting, a frequency of: - especially at night Avoid standing for long periods of time. Exercise regularly Home Health: No change in wound care orders this week; continue Home Health for wound care. May utilize formulary equivalent dressing for wound treatment orders unless otherwise specified. Dressing changes to be completed by [place] on Monday / Wednesday / Friday except when patient has scheduled visit at [facility]. Other Home Health Orders/Instructions: - [facility] WOUND #61: - Lower Leg Wound Laterality: Left, Circumferential Peri-Wound Care: Zinc Oxide Ointment 30g tube (Home Health) 3 x Per Day/30 Days Discharge Instructions: Apply Zinc Oxiide to weeping areas with each dressing change Peri-Wound Care: Sween Lotion (Moisturizing lotion) (Home Health) 3 x Per Day/30 Days Discharge Instructions: Apply moisturizing lotion as directed Prim Dressing: KerraCel Ag Gelling Fiber Dressing, 4x5 in (silver alginate) (Home Health) 3 x Per Day/30 Days ary Discharge Instructions: Apply silver alginate to wound bed as instructed Secondary Dressing: Zetuvit Plus 4x4 in 3 x Per Day/30 Days Discharge Instructions: or equivalent extra absorbent pad.Apply over primary dressing as directed. Com pression Wrap: ThreePress (3 layer compression wrap) (Home Health) 3 x Per Day/30 Days Discharge Instructions: Apply three layer compression as directed. Pad bend of ankle with foam or ABD pad. WOUND #64: - Foot  Wound Laterality: Dorsal, Left Peri-Wound Care: Zinc Oxide Ointment 30g tube 3 x Per Day/30 Days Discharge Instructions: Apply Zinc Oxiide to weeping areas with each dressing change Peri-Wound Care: Sween Lotion (Moisturizing lotion) (Home Health) 3 x Per Day/30 Days Discharge Instructions: Apply moisturizing lotion as directed Prim Dressing: KerraCel Ag Gelling Fiber Dressing, 4x5 in (silver alginate) (Home Health) 3 x Per Day/30 Days ary Discharge Instructions: Apply silver alginate to wound bed as instructed Secondary Dressing: Zetuvit Plus 4x4 in 3 x Per Day/30 Days Discharge Instructions: or equivalent extra absorbent pad.Apply over primary dressing as directed. Com pression Wrap: ThreePress (3 layer compression wrap) (Home Health) 3 x Per Day/30 Days Discharge Instructions: Apply three layer compression as directed. Pad bend of ankle with foam or ABD pad. WOUND #67: - Lower Leg Wound Laterality: Right, Medial Peri-Wound Care: Zinc Oxide Ointment 30g tube 3 x Per Day/30 Days Discharge Instructions: Apply Zinc Oxiide to weeping areas with each dressing change Peri-Wound Care: Sween Lotion (Moisturizing lotion) (Home Health) 3 x Per Day/30 Days Discharge Instructions: Apply moisturizing lotion as directed Prim Dressing: KerraCel Ag Gelling Fiber Dressing, 4x5 in (silver alginate) (Home Health) 3 x Per Day/30 Days ary Discharge Instructions: Apply silver alginate to wound bed as instructed Secondary Dressing: Zetuvit Plus 4x4 in 3 x Per Day/30 Days Discharge Instructions: or equivalent extra absorbent pad.Apply over primary dressing as directed. Com pression Wrap: Kerlix Roll 4.5x3.1 (in/yd) (Home Health) 3 x Per Day/30 Days Discharge Instructions: Apply Kerlix and Coban compression as directed. Com pression Wrap: Coban Self-Adherent Wrap 4x5 (in/yd) (Home Health) 3 x Per Day/30 Days Discharge Instructions: Apply over Kerlix as directed. WOUND #69: - Foot Wound Laterality: Dorsal,  Right Peri-Wound Care: Zinc Oxide Ointment 30g tube 3 x Per Day/30 Days Discharge Instructions: Apply Zinc Oxiide to weeping areas with each dressing change Peri-Wound Care: Sween Lotion (Moisturizing lotion) (Home Health) 3 x Per Day/30 Days Discharge Instructions: Apply moisturizing lotion as directed Prim Dressing: KerraCel Ag Gelling Fiber Dressing, 4x5 in (silver alginate) (Home Health) 3 x Per Day/30 Days ary Discharge Instructions:  Apply silver alginate to wound bed as instructed Secondary Dressing: Zetuvit Plus 4x4 in 3 x Per Day/30 Days Discharge Instructions: or equivalent extra absorbent pad.Apply over primary dressing as directed. Com pression Wrap: Kerlix Roll 4.5x3.1 (in/yd) (Home Health) 3 x Per Day/30 Days Discharge Instructions: Apply Kerlix and Coban compression as directed. Com pression Wrap: Coban Self-Adherent Wrap 4x5 (in/yd) (Home Health) 3 x Per Day/30 Days Discharge Instructions: Apply over Kerlix as directed. WOUND #70: - Lower Leg Wound Laterality: Right, Anterior Peri-Wound Care: Zinc Oxide Ointment 30g tube 3 x Per Day/30 Days Discharge Instructions: Apply Zinc Oxiide to weeping areas with each dressing change Peri-Wound Care: Sween Lotion (Moisturizing lotion) (Home Health) 3 x Per Day/30 Days Discharge Instructions: Apply moisturizing lotion as directed Prim Dressing: KerraCel Ag Gelling Fiber Dressing, 4x5 in (silver alginate) ([place]) 3 x Per Day/30 Days ary Discharge Instructions: Apply silver alginate to wound bed as instructed Secondary Dressing: Zetuvit Plus 4x4 in 3 x Per Day/30 Days Discharge Instructions: or equivalent extra absorbent pad.Apply over primary dressing as directed. Com pression Wrap: Kerlix Roll 4.5x3.1 (in/yd) (Home Health) 3 x Per Day/30 Days Discharge Instructions: Apply Kerlix and Coban compression as directed. Com pression Wrap: Coban Self-Adherent Wrap 4x5 (in/yd) (Home Health) 3 x Per Day/30 Days Discharge Instructions:  Apply over Kerlix as directed. 1. I did reiterate to the patient again today with her husband present that she needs to try to elevate her legs much as possible to help keep edema under control. She has been using her lymphedema pumps and this is all going to take her basically becoming more self-sufficient in this regard in order to get the swelling under control. Short of doing this I am not certain that she is good to make a tremendous amount of improvements. 2. Specifically she should be using her lymphedema pumps for an hour 2 times a day which will be 2 hours total each day. 3. I am also can recommend that she needs to be taking her fluid pills if she is out she needs to get them filled ASAP. 4. I am also going to suggest she should be sleeping in her bed with her feet elevated. During the day if she sit in her recliner she should also be elevated. 5. She should also keep the compression wraps in place obviously they are helping to some degree as well. We will see patient back for reevaluation in 2 weeks here in the clinic. If anything worsens or changes patient will contact our office for additional recommendations. Electronic Signature(s) Signed: 09/26/2020 10:20:49 AM By: [name] PA-C Previous Signature: 09/26/2020 10:20:36 AM Version By: [name] PA-C Entered By: [name] on 09/26/2020 10:20:49 -------------------------------------------------------------------------------- SuperBill Details Patient Name: Date of Service: [name] 09/26/2020 Medical Record Number: [id number] Patient Account Number: [id number] Date of Birth/Sex: Treating RN: 1950/12/15 (69 y.o. [name] Primary Care Provider: [name] Other Clinician: Referring Provider: Treating Provider/Extender: Darlen Round in Treatment: 164 Diagnosis Coding ICD-10 Codes Code Description E11.622 Type 2 diabetes mellitus with other skin ulcer I89.0 Lymphedema, not  elsewhere classified I87.331 Chronic venous hypertension (idiopathic) with ulcer and inflammation of right lower extremity I87.332 Chronic venous hypertension (idiopathic) with ulcer and inflammation of left lower extremity L97.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 leg with fat layer exposed L97.822 Non-pressure chronic ulcer of other part of left lower leg with fat layer exposed L97.522 Non-pressure chronic ulcer of other part  of left foot with fat layer exposed I10 Essential (primary) hypertension E66.01 Morbid (severe) obesity due to excess calories F41.8 Other specified anxiety disorders R53.1 Weakness Facility Procedures Physician Procedures : CPT4 Code Description Modifier [id number] - WC PHYS LEVEL 3 - EST PT ICD-10 Diagnosis Description E11.622 Type 2 diabetes mellitus with other skin ulcer I89.0 Lymphedema, not elsewhere classified I87.331 Chronic venous hypertension (idiopathic) with  ulcer and inflammation of right lower extremity I87.332 Chronic venous hypertension (idiopathic) with ulcer and inflammation of left lower extremity Quantity: 1 Electronic Signature(s) Signed: 09/26/2020 10:21:03 AM By: [name] PA-C Entered By: [name] on 09/26/2020 10:21:01

## 2020-09-27 NOTE — Progress Notes (Signed)
[name], [name] ([id number]) Visit Report for 09/26/2020 Arrival Information Details Patient Name: Date of Service: [name], [name] 09/26/2020 8:30 A M Medical Record Number: [id number] Patient Account Number: [id number] Date of Birth/Sex: Treating RN: August 27, 1950 (70 y.o. Martyn Malay, Linda Primary Care Nataliah Hatlestad: [name] Other Clinician: Referring Jahmarion Popoff: Treating Brayam Boeke/Extender: Darlen Round in Treatment: 164 Visit Information History Since Last Visit Added or deleted any medications: No Patient Arrived: Wheel Chair Any new allergies or adverse reactions: No Arrival Time: 09:05 Had a fall or experienced change in No Accompanied By: husband activities of daily living that may affect Transfer Assistance: None risk of falls: Patient Identification Verified: Yes Signs or symptoms of abuse/neglect since last visito No Secondary Verification Process Completed: Yes Hospitalized since last visit: No Patient Requires Transmission-Based Precautions: No Implantable device outside of the clinic excluding No Patient Has Alerts: Yes cellular tissue based products placed in the center Patient Alerts: R ABI= 1.01 since last visit: L ABI = .99 Has Dressing in Place as Prescribed: Yes Pain Present Now: Yes Electronic Signature(s) Signed: 09/26/2020 10:47:54 AM By: Sandre Kitty Entered By: Sandre Kitty on 09/26/2020 09:06:55 -------------------------------------------------------------------------------- Compression Therapy Details Patient Name: Date of Service: [name]. 09/26/2020 8:30 A M Medical Record Number: [id number] Patient Account Number: [id number] Date of Birth/Sex: Treating RN: 1951-04-01 (70 y.o. [name] Primary Care Unique Searfoss: [name] Other Clinician: Referring Christophe Rising: Treating Breelyn Icard/Extender: Darlen Round in Treatment: 164 Compression Therapy Performed for Wound Assessment: Wound #61  Left,Circumferential Lower Leg Performed By: Clinician Rhae Hammock, RN Compression Type: Three Layer Post Procedure Diagnosis Same as Pre-procedure Electronic Signature(s) Signed: 09/26/2020 5:38:32 PM By: [name] RN, BSN Entered By: [name] on 09/26/2020 10:04:43 -------------------------------------------------------------------------------- Encounter Discharge Information Details Patient Name: Date of Service: [name]. 09/26/2020 8:30 A M Medical Record Number: [id number] Patient Account Number: [id number] Date of Birth/Sex: Treating RN: 1951/02/04 (70 y.o. [name], [name] Primary Care Griffen Frayne: [name] Other Clinician: Referring Saren Corkern: Treating Kaziyah Parkison/Extender: Darlen Round in Treatment: [phone] Encounter Discharge Information Items Discharge Condition: Stable Ambulatory Status: Wheelchair Discharge Destination: Home Transportation: Private Auto Accompanied By: spouse Schedule Follow-up Appointment: Yes Clinical Summary of Care: Electronic Signature(s) Signed: 09/26/2020 12:26:47 PM By: Deon Pilling Entered By: Deon Pilling on 09/26/2020 10:46:15 -------------------------------------------------------------------------------- Lower Extremity Assessment Details Patient Name: Date of Service: [name], [name] 09/26/2020 8:30 A M Medical Record Number: [id number] Patient Account Number: [id number] Date of Birth/Sex: Treating RN: December 01, 1950 (70 y.o. [name], Lauren Primary Care Jaegar Croft: [name] Other Clinician: Referring Syna Gad: Treating Matison Nuccio/Extender: Darlen Round in Treatment: 164 Edema Assessment Assessed: [name]: Yes] [name]: Yes] Edema: [Left: Yes] [Right: Yes] Calf Left: Right: Point of Measurement: 37 cm From Medial Instep 38 cm 42 cm Ankle Left: Right: Point of Measurement: 10 cm From Medial Instep 30 cm 25 cm Vascular Assessment Pulses: Dorsalis  Pedis Palpable: [Left:Yes] [Right:Yes] Posterior Tibial Palpable: [Left:Yes] [Right:Yes] Electronic Signature(s) Signed: 09/27/2020 5:45:48 PM By: Rhae Hammock RN Entered By: Rhae Hammock on 09/26/2020 09:48:42 -------------------------------------------------------------------------------- Multi-Disciplinary Care Plan Details Patient Name: Date of Service: [name]. 09/26/2020 8:30 A M Medical Record Number: [id number] Patient Account Number: [id number] Date of Birth/Sex: Treating RN: [birth year] (70 y.o. [name] Primary Care Rayanna Matusik: [name] Other Clinician: Referring Aydrien Froman: Treating Jamare Vanatta/Extender: Darlen Round in Treatment: [place] reviewed with physician Active Inactive Venous Leg Ulcer Nursing Diagnoses: Actual venous Insuffiency (  use after diagnosis is confirmed) Knowledge deficit related to disease process and management Goals: Patient will maintain optimal edema control Date Initiated: 08/12/2017 Target Resolution Date: 10/24/2020 Goal Status: Active Patient/caregiver will verbalize understanding of disease process and disease management Date Initiated: 08/12/2017 Date Inactivated: 04/21/2018 Target Resolution Date: 04/24/2018 Goal Status: Met Interventions: Assess peripheral edema status every visit. Compression as ordered Treatment Activities: Therapeutic compression applied : 08/12/2017 Notes: Wound/Skin Impairment Nursing Diagnoses: Impaired tissue integrity Knowledge deficit related to ulceration/compromised skin integrity Goals: Patient/caregiver will verbalize understanding of skin care regimen Date Initiated: 08/12/2017 Target Resolution Date: 10/24/2020 Goal Status: Active Ulcer/skin breakdown will have a volume reduction of 30% by week 4 Date Initiated: 08/05/2017 Date Inactivated: 09/30/2017 Target Resolution Date: 10/03/2017 Goal Status: Met Ulcer/skin breakdown will  have a volume reduction of 50% by week 8 Date Initiated: 09/30/2017 Date Inactivated: 10/28/2017 Target Resolution Date: 10/28/2017 Goal Status: Met Interventions: Assess patient/caregiver ability to perform ulcer/skin care regimen upon admission and as needed Assess ulceration(s) every visit Provide education on ulcer and skin care Screen for HBO Treatment Activities: Patient referred to home care : 08/05/2017 Skin care regimen initiated : 08/05/2017 Topical wound management initiated : 08/05/2017 Notes: Electronic Signature(s) Signed: 09/26/2020 5:38:32 PM By: [name] RN, BSN Entered By: [name] on 09/26/2020 09:02:48 -------------------------------------------------------------------------------- Pain Assessment Details Patient Name: Date of Service: [name]. 09/26/2020 8:30 A M Medical Record Number: [id number] Patient Account Number: [id number] Date of Birth/Sex: Treating RN: 10/12/1950 (70 y.o. [name] Primary Care Maelyn Berrey: [name] Other Clinician: Referring Carlton Buskey: Treating Jasminne Mealy/Extender: Darlen Round in Treatment: [phone] Active Problems Location of Pain Severity and Description of Pain Patient Has Paino Yes Site Locations Rate the pain. Current Pain Level: 6 Pain Management and Medication Current Pain Management: Electronic Signature(s) Signed: 09/26/2020 10:47:54 AM By: Sandre Kitty Signed: 09/26/2020 5:38:32 PM By: [name] RN, BSN Entered By: Sandre Kitty on 09/26/2020 09:07:11 -------------------------------------------------------------------------------- Patient/Caregiver Education Details Patient Name: Date of Service: [name] 4/13/2022andnbsp8:30 A M Medical Record Number: [id number] Patient Account Number: [id number] Date of Birth/Gender: Treating RN: 09/01/1950 (70 y.o. [name] Primary Care Physician: [name] Other Clinician: Referring  Physician: Treating Physician/Extender: Darlen Round in Treatment: [phone] Education Assessment Education Provided To: Patient Education Topics Provided Venous: Methods: Explain/Verbal Responses: Reinforcements needed, State content correctly Wound/Skin Impairment: Methods: Explain/Verbal Responses: Reinforcements needed, State content correctly Electronic Signature(s) Signed: 09/26/2020 5:38:32 PM By: [name] RN, BSN Entered By: [name] on 09/26/2020 09:03:15 -------------------------------------------------------------------------------- Wound Assessment Details Patient Name: Date of Service: [name]. 09/26/2020 8:30 A M Medical Record Number: [id number] Patient Account Number: [id number] Date of Birth/Sex: Treating RN: January 25, 1951 (70 y.o. [name] Primary Care Jamerion Cabello: [name] Other Clinician: Referring Dinah Lupa: Treating Myli Pae/Extender: Darlen Round in Treatment: 164 Wound Status Wound Number: 61 Primary Venous Leg Ulcer Etiology: Wound Location: Left, Circumferential Lower Leg Wound Open Wounding Event: Gradually Appeared Status: Date Acquired: 04/20/2019 Comorbid Asthma, Hypertension, Peripheral Arterial Disease, Peripheral Weeks Of Treatment: 75 History: Venous Disease, Type II Diabetes, Gout, Osteoarthritis Clustered Wound: Yes Photos Wound Measurements Length: (cm) 14 Width: (cm) 14 Depth: (cm) 0.1 Clustered Quantity: 2 Area: (cm) 153.938 Volume: (cm) 15.394 % Reduction in Area: -1941.6% % Reduction in Volume: -1941.6% Epithelialization: Medium (34-66%) Wound Description Classification: Full Thickness Without Exposed Support Structures Wound Margin: Indistinct, nonvisible Exudate Amount: Medium Exudate Type: Serous Exudate Color: amber Foul Odor After Cleansing: No Slough/Fibrino No  Wound Bed Granulation Amount: Large (67-100%) Exposed Structure Granulation Quality:  Pink, Pale Fascia Exposed: No Necrotic Amount: None Present (0%) Fat Layer (Subcutaneous Tissue) Exposed: Yes Tendon Exposed: No Muscle Exposed: No Joint Exposed: No Bone Exposed: No Treatment Notes Wound #61 (Lower Leg) Wound Laterality: Left, Circumferential Cleanser Peri-Wound Care Zinc Oxide Ointment 30g tube Discharge Instruction: Apply Zinc Oxiide to weeping areas with each dressing change Sween Lotion (Moisturizing lotion) Discharge Instruction: Apply moisturizing lotion as directed Topical Primary Dressing KerraCel Ag Gelling Fiber Dressing, 4x5 in (silver alginate) Discharge Instruction: Apply silver alginate to wound bed as instructed Secondary Dressing Zetuvit Plus 4x4 in Discharge Instruction: or equivalent extra absorbent pad.Apply over primary dressing as directed. Secured With Compression Wrap ThreePress (3 layer compression wrap) Discharge Instruction: Apply three layer compression as directed. Pad bend of ankle with foam or ABD pad. Compression Stockings Add-Ons Electronic Signature(s) Signed: 09/26/2020 4:36:07 PM By: Sandre Kitty Signed: 09/26/2020 5:38:32 PM By: [name] RN, BSN Entered By: Sandre Kitty on 09/26/2020 16:13:14 -------------------------------------------------------------------------------- Wound Assessment Details Patient Name: Date of Service: [name]. 09/26/2020 8:30 A M Medical Record Number: [id number] Patient Account Number: [id number] Date of Birth/Sex: Treating RN: Mar 31, 1951 (70 y.o. [name] Primary Care : [name] Other Clinician: Referring : Treating /Extender: Darlen Round in Treatment: 164 Wound Status Wound Number: 64 Primary Diabetic Wound/Ulcer of the Lower Extremity Etiology: Wound Location: Left, Dorsal Foot Wound Open Wounding Event: Gradually Appeared Status: Date Acquired: 06/01/2019 Comorbid Asthma, Hypertension, Peripheral  Arterial Disease, Peripheral Weeks Of Treatment: 69 History: Venous Disease, Type II Diabetes, Gout, Osteoarthritis Clustered Wound: No Photos Wound Measurements Length: (cm) 6 Width: (cm) 5 Depth: (cm) 0.1 Area: (cm) 23.562 Volume: (cm) 2.356 % Reduction in Area: -14180% % Reduction in Volume: -4708.2% Epithelialization: Medium (34-66%) Tunneling: No Undermining: No Wound Description Classification: Grade 1 Wound Margin: Flat and Intact Exudate Amount: Medium Exudate Type: Serous Exudate Color: amber Foul Odor After Cleansing: No Slough/Fibrino No Wound Bed Granulation Amount: Large (67-100%) Exposed Structure Granulation Quality: Pink, Pale Fascia Exposed: No Necrotic Amount: None Present (0%) Fat Layer (Subcutaneous Tissue) Exposed: Yes Tendon Exposed: No Muscle Exposed: No Joint Exposed: No Bone Exposed: No Treatment Notes Wound #64 (Foot) Wound Laterality: Dorsal, Left Cleanser Peri-Wound Care Zinc Oxide Ointment 30g tube Discharge Instruction: Apply Zinc Oxiide to weeping areas with each dressing change Sween Lotion (Moisturizing lotion) Discharge Instruction: Apply moisturizing lotion as directed Topical Primary Dressing KerraCel Ag Gelling Fiber Dressing, 4x5 in (silver alginate) Discharge Instruction: Apply silver alginate to wound bed as instructed Secondary Dressing Zetuvit Plus 4x4 in Discharge Instruction: or equivalent extra absorbent pad.Apply over primary dressing as directed. Secured With Compression Wrap ThreePress (3 layer compression wrap) Discharge Instruction: Apply three layer compression as directed. Pad bend of ankle with foam or ABD pad. Compression Stockings Add-Ons Electronic Signature(s) Signed: 09/26/2020 4:36:07 PM By: Sandre Kitty Signed: 09/26/2020 5:38:32 PM By: [name] RN, BSN Entered By: Sandre Kitty on 09/26/2020  16:10:29 -------------------------------------------------------------------------------- Wound Assessment Details Patient Name: Date of Service: [name]. 09/26/2020 8:30 A M Medical Record Number: [id number] Patient Account Number: [id number] Date of Birth/Sex: Treating RN: [birth year] (70 y.o. [name] Primary Care : [name] Other Clinician: Referring : Treating /Extender: Darlen Round in Treatment: 164 Wound Status Wound Number: 67 Primary Diabetic Wound/Ulcer of the Lower Extremity Etiology: Wound Location: Right, Medial Lower Leg Wound Open Wounding Event: Gradually Appeared Status: Date Acquired: 05/02/2020 Comorbid Asthma,  Hypertension, Peripheral Arterial Disease, Peripheral Weeks Of Treatment: 21 History: Venous Disease, Type II Diabetes, Gout, Osteoarthritis Clustered Wound: No Photos Wound Measurements Length: (cm) 10.5 Width: (cm) 7 Depth: (cm) 0.1 Area: (cm) 57.727 Volume: (cm) 5.773 % Reduction in Area: -3982.5% % Reduction in Volume: -3994.3% Epithelialization: Small (1-33%) Tunneling: No Undermining: No Wound Description Classification: Grade 1 Wound Margin: Thickened Exudate Amount: Medium Exudate Type: Serous Exudate Color: amber Foul Odor After Cleansing: No Slough/Fibrino No Wound Bed Granulation Amount: Large (67-100%) Exposed Structure Granulation Quality: Pink, Pale Fascia Exposed: No Necrotic Amount: None Present (0%) Fat Layer (Subcutaneous Tissue) Exposed: Yes Tendon Exposed: No Muscle Exposed: No Joint Exposed: No Bone Exposed: No Treatment Notes Wound #67 (Lower Leg) Wound Laterality: Right, Medial Cleanser Peri-Wound Care Zinc Oxide Ointment 30g tube Discharge Instruction: Apply Zinc Oxiide to weeping areas with each dressing change Sween Lotion (Moisturizing lotion) Discharge Instruction: Apply moisturizing lotion as directed Topical Primary  Dressing KerraCel Ag Gelling Fiber Dressing, 4x5 in (silver alginate) Discharge Instruction: Apply silver alginate to wound bed as instructed Secondary Dressing Zetuvit Plus 4x4 in Discharge Instruction: or equivalent extra absorbent pad.Apply over primary dressing as directed. Secured With Compression Wrap Kerlix Roll 4.5x3.1 (in/yd) Discharge Instruction: Apply Kerlix and Coban compression as directed. Coban Self-Adherent Wrap 4x5 (in/yd) Discharge Instruction: Apply over Kerlix as directed. Compression Stockings Add-Ons Electronic Signature(s) Signed: 09/26/2020 4:36:07 PM By: Sandre Kitty Signed: 09/26/2020 5:38:32 PM By: [name] RN, BSN Entered By: Sandre Kitty on 09/26/2020 16:09:51 -------------------------------------------------------------------------------- Wound Assessment Details Patient Name: Date of Service: [name]. 09/26/2020 8:30 A M Medical Record Number: [id number] Patient Account Number: [id number] Date of Birth/Sex: Treating RN: 21-May-1951 (70 y.o. [name] Primary Care Tempestt Silba: [name] Other Clinician: Referring Newton Frutiger: Treating Malita Ignasiak/Extender: Darlen Round in Treatment: 164 Wound Status Wound Number: 69 Primary Lymphedema Etiology: Wound Location: Right, Dorsal Foot Secondary Diabetic Wound/Ulcer of the Lower Extremity Wounding Event: Gradually Appeared Etiology: Date Acquired: 07/25/2020 Wound Open Weeks Of Treatment: 9 Status: Clustered Wound: No Comorbid Asthma, Hypertension, Peripheral Arterial Disease, Peripheral History: Venous Disease, Type II Diabetes, Gout, Osteoarthritis Photos Wound Measurements Length: (cm) 0.7 Width: (cm) 1.4 Depth: (cm) 0.1 Area: (cm) 0.77 Volume: (cm) 0.077 % Reduction in Area: 75.5% % Reduction in Volume: 75.5% Epithelialization: Small (1-33%) Tunneling: No Undermining: No Wound Description Classification: Full Thickness Without Exposed  Support Structures Wound Margin: Distinct, outline attached Exudate Amount: Medium Exudate Type: Serosanguineous Exudate Color: red, brown Foul Odor After Cleansing: No Slough/Fibrino No Wound Bed Granulation Amount: Large (67-100%) Exposed Structure Granulation Quality: Pink, Pale Fascia Exposed: No Necrotic Amount: None Present (0%) Fat Layer (Subcutaneous Tissue) Exposed: Yes Tendon Exposed: No Muscle Exposed: No Joint Exposed: No Bone Exposed: No Treatment Notes Wound #69 (Foot) Wound Laterality: Dorsal, Right Cleanser Peri-Wound Care Zinc Oxide Ointment 30g tube Discharge Instruction: Apply Zinc Oxiide to weeping areas with each dressing change Sween Lotion (Moisturizing lotion) Discharge Instruction: Apply moisturizing lotion as directed Topical Primary Dressing KerraCel Ag Gelling Fiber Dressing, 4x5 in (silver alginate) Discharge Instruction: Apply silver alginate to wound bed as instructed Secondary Dressing Zetuvit Plus 4x4 in Discharge Instruction: or equivalent extra absorbent pad.Apply over primary dressing as directed. Secured With Compression Wrap Kerlix Roll 4.5x3.1 (in/yd) Discharge Instruction: Apply Kerlix and Coban compression as directed. Coban Self-Adherent Wrap 4x5 (in/yd) Discharge Instruction: Apply over Kerlix as directed. Compression Stockings Add-Ons Electronic Signature(s) Signed: 09/26/2020 4:36:07 PM By: Sandre Kitty Signed: 09/26/2020 5:38:32 PM By: [name] RN, BSN Entered  By: Sandre Kitty on 09/26/2020 16:08:43 -------------------------------------------------------------------------------- Wound Assessment Details Patient Name: Date of Service: [name], [name] 09/26/2020 8:30 A M Medical Record Number: [id number] Patient Account Number: [id number] Date of Birth/Sex: Treating RN: May 13, 1951 (70 y.o. [name] Primary Care : [name] Other Clinician: Referring : Treating  /Extender: Darlen Round in Treatment: 164 Wound Status Wound Number: 70 Primary Venous Leg Ulcer Etiology: Wound Location: Right, Anterior Lower Leg Wound Open Wounding Event: Gradually Appeared Status: Date Acquired: 08/29/2020 Comorbid Asthma, Hypertension, Peripheral Arterial Disease, Peripheral Weeks Of Treatment: 4 History: Venous Disease, Type II Diabetes, Gout, Osteoarthritis Clustered Wound: No Photos Wound Measurements Length: (cm) 9 Width: (cm) 6.1 Depth: (cm) 0.1 Area: (cm) 43.118 Volume: (cm) 4.312 % Reduction in Area: -3560.3% % Reduction in Volume: -3554.2% Epithelialization: None Tunneling: No Undermining: No Wound Description Classification: Full Thickness Without Exposed Support Structures Wound Margin: Flat and Intact Exudate Amount: Medium Exudate Type: Serosanguineous Exudate Color: red, brown Foul Odor After Cleansing: No Slough/Fibrino No Wound Bed Granulation Amount: Large (67-100%) Exposed Structure Granulation Quality: Pink Fascia Exposed: No Necrotic Amount: None Present (0%) Fat Layer (Subcutaneous Tissue) Exposed: Yes Tendon Exposed: No Muscle Exposed: No Joint Exposed: No Bone Exposed: No Treatment Notes Wound #70 (Lower Leg) Wound Laterality: Right, Anterior Cleanser Peri-Wound Care Zinc Oxide Ointment 30g tube Discharge Instruction: Apply Zinc Oxiide to weeping areas with each dressing change Sween Lotion (Moisturizing lotion) Discharge Instruction: Apply moisturizing lotion as directed Topical Primary Dressing KerraCel Ag Gelling Fiber Dressing, 4x5 in (silver alginate) Discharge Instruction: Apply silver alginate to wound bed as instructed Secondary Dressing Zetuvit Plus 4x4 in Discharge Instruction: or equivalent extra absorbent pad.Apply over primary dressing as directed. Secured With Compression Wrap Kerlix Roll 4.5x3.1 (in/yd) Discharge Instruction: Apply Kerlix and Coban compression as  directed. Coban Self-Adherent Wrap 4x5 (in/yd) Discharge Instruction: Apply over Kerlix as directed. Compression Stockings Add-Ons Electronic Signature(s) Signed: 09/26/2020 4:36:07 PM By: Sandre Kitty Signed: 09/26/2020 5:38:32 PM By: [name] RN, BSN Entered By: Sandre Kitty on 09/26/2020 16:09:18 -------------------------------------------------------------------------------- Vitals Details Patient Name: Date of Service: [name]. 09/26/2020 8:30 A M Medical Record Number: [id number] Patient Account Number: [id number] Date of Birth/Sex: Treating RN: 21-Jul-1950 (71 y.o. [name] Primary Care : [name] Other Clinician: Referring : Treating /Extender: Darlen Round in Treatment: 164 Vital Signs Time Taken: 09:07 Temperature (F): 97.5 Height (in): 62 Pulse (bpm): 79 Weight (lbs): 335 Respiratory Rate (breaths/min): 18 Body Mass Index (BMI): 61.3 Blood Pressure (mmHg): 160/70 Reference Range: 80 - 120 mg / dl Electronic Signature(s) Signed: 09/26/2020 10:47:54 AM By: Sandre Kitty Entered By: Sandre Kitty on 09/26/2020 09:09:00

## 2020-10-10 ENCOUNTER — Other Ambulatory Visit: Payer: Self-pay

## 2020-10-10 ENCOUNTER — Encounter (HOSPITAL_BASED_OUTPATIENT_CLINIC_OR_DEPARTMENT_OTHER): Payer: Medicare PPO | Admitting: Internal Medicine

## 2020-10-10 DIAGNOSIS — E11622 Type 2 diabetes mellitus with other skin ulcer: Secondary | ICD-10-CM | POA: Diagnosis not present

## 2020-10-10 NOTE — Progress Notes (Signed)
[name], [name] ([id number]) Visit Report for 10/10/2020 Arrival Information Details Patient Name: Date of Service: [name], [name] 10/10/2020 11:00 A M Medical Record Number: [id number] Patient Account Number: [id number] Date of Birth/Sex: Treating RN: 06-27-1950 (70 y.o. [name]) Boehlein, Linda Primary Care Lehman Whiteley: Fatima SangerSmith, Fred Other Clinician: Referring Heily Carlucci: Treating Reene Harlacher/Extender: Ebony CargoHoffman, Jessica Smith, Fred Weeks in Treatment: 166 Visit Information History Since Last Visit Added or deleted any medications: No Patient Arrived: Wheel Chair Any new allergies or adverse reactions: No Arrival Time: 11:25 Had a fall or experienced change in No Accompanied By: husband activities of daily living that may affect Transfer Assistance: None risk of falls: Patient Identification Verified: Yes Signs or symptoms of abuse/neglect since last visito No Secondary Verification Process Completed: Yes Hospitalized since last visit: No Patient Requires Transmission-Based Precautions: No Implantable device outside of the clinic excluding No Patient Has Alerts: Yes cellular tissue based products placed in the center Patient Alerts: R ABI= 1.01 since last visit: L ABI = .99 Has Dressing in Place as Prescribed: Yes Pain Present Now: No Electronic Signature(s) Signed: 10/10/2020 4:46:04 PM By: Karl Itoawkins, Destiny Entered By: Karl Itoawkins, Destiny on 10/10/2020 11:25:30 -------------------------------------------------------------------------------- Compression Therapy Details Patient Name: Date of Service: [name], [name] 10/10/2020 11:00 A M Medical Record Number: [id number] Patient Account Number: [id number] Date of Birth/Sex: Treating RN: 06-27-1950 (70 y.o. [name]) [name], [name] Primary Care Lanna Labella: Fatima SangerSmith, Fred Other Clinician: Referring Lino Wickliff: Treating Makara Lanzo/Extender: Ebony CargoHoffman, Jessica Smith, Fred Weeks in Treatment: 166 Compression Therapy Performed for Wound Assessment: Wound #61  Left,Circumferential Lower Leg Performed By: Clinician Shawn Stalleaton, Bobbi, RN Compression Type: Three Layer Electronic Signature(s) Signed: 10/10/2020 5:19:23 PM By: Shawn Stalleaton, [name] Entered By: Shawn Stalleaton, [name] on 10/10/2020 12:46:06 -------------------------------------------------------------------------------- Compression Therapy Details Patient Name: Date of Service: [name], [name] 10/10/2020 11:00 A M Medical Record Number: [id number] Patient Account Number: [id number] Date of Birth/Sex: Treating RN: 06-27-1950 (70 y.o. [name]) [name], [name] Primary Care Vence Lalor: Other Clinician: Fatima SangerSmith, Fred Referring Earl Zellmer: Treating Mickala Laton/Extender: Ebony CargoHoffman, Jessica Smith, Fred Weeks in Treatment: 166 Compression Therapy Performed for Wound Assessment: Wound #64 Left,Dorsal Foot Performed By: Clinician Shawn Stalleaton, Bobbi, RN Compression Type: Three Layer Electronic Signature(s) Signed: 10/10/2020 5:19:23 PM By: Shawn Stalleaton, [name] Entered By: Shawn Stalleaton, [name] on 10/10/2020 12:46:06 -------------------------------------------------------------------------------- Encounter Discharge Information Details Patient Name: Date of Service: [name], [name] 10/10/2020 11:00 A M Medical Record Number: [id number] Patient Account Number: [id number] Date of Birth/Sex: Treating RN: 06-27-1950 (70 y.o. [name]) [name], [name] Primary Care Shamieka Gullo: Fatima SangerSmith, Fred Other Clinician: Referring Porfiria Heinrich: Treating Ky Rumple/Extender: Ebony CargoHoffman, Jessica Smith, Fred Weeks in Treatment: 166 Encounter Discharge Information Items Discharge Condition: Stable Ambulatory Status: Wheelchair Discharge Destination: Home Transportation: Private Auto Accompanied By: self Schedule Follow-up Appointment: Yes Clinical Summary of Care: Electronic Signature(s) Signed: 10/10/2020 5:19:23 PM By: Shawn Stalleaton, [name] Entered By: Shawn Stalleaton, [name] on 10/10/2020  12:45:45 -------------------------------------------------------------------------------- Patient/Caregiver Education Details Patient Name: Date of Service: [name], [name] 4/27/2022andnbsp11:00 A M Medical Record Number: [id number] Patient Account Number: [id number] Date of Birth/Gender: Treating RN: 06-27-1950 (70 y.o. [name]) [name], [name] Primary Care Physician: Fatima SangerSmith, Fred Other Clinician: Referring Physician: Treating Physician/Extender: Ebony CargoHoffman, Jessica Smith, Fred Weeks in Treatment: 166 Education Assessment Education Provided To: Patient Education Topics Provided Wound/Skin Impairment: Handouts: Skin Care Do's and Dont's Methods: Explain/Verbal Responses: Reinforcements needed Electronic Signature(s) Signed: 10/10/2020 5:19:23 PM By: Shawn Stalleaton, [name] Entered By: Shawn Stalleaton, [name] on 10/10/2020 12:45:34 -------------------------------------------------------------------------------- Wound Assessment Details Patient Name: Date of Service: [name], [name] 10/10/2020 11:00 A M Medical Record Number: [id number] Patient Account Number: [id number] Date of Birth/Sex: Treating RN: 06-27-1950 (  70 y.o. [name] Primary Care Royanne Warshaw: Fatima Sanger Other Clinician: Referring Kevion Fatheree: Treating Ronnae Kaser/Extender: Ebony Cargo in Treatment: 166 Wound Status Wound Number: 61 Primary Etiology: Venous Leg Ulcer Wound Location: Left, Circumferential Lower Leg Wound Status: Open Wounding Event: Gradually Appeared Date Acquired: 04/20/2019 Weeks Of Treatment: 77 Clustered Wound: Yes Wound Measurements Length: (cm) 14 Width: (cm) 14 Depth: (cm) 0.1 Area: (cm) 153.938 Volume: (cm) 15.394 % Reduction in Area: -1941.6% % Reduction in Volume: -1941.6% Wound Description Classification: Full Thickness Without Exposed Support Structur es Treatment Notes Wound #61 (Lower Leg) Wound Laterality: Left, Circumferential Cleanser Peri-Wound Care Zinc Oxide  Ointment 30g tube Discharge Instruction: Apply Zinc Oxiide to weeping areas with each dressing change Sween Lotion (Moisturizing lotion) Discharge Instruction: Apply moisturizing lotion as directed Topical Primary Dressing KerraCel Ag Gelling Fiber Dressing, 4x5 in (silver alginate) Discharge Instruction: Apply silver alginate to wound bed as instructed Secondary Dressing Zetuvit Plus 4x4 in Discharge Instruction: or equivalent extra absorbent pad.Apply over primary dressing as directed. Secured With Compression Wrap ThreePress (3 layer compression wrap) Discharge Instruction: Apply three layer compression as directed. Pad bend of ankle with foam or ABD pad. Compression Stockings Add-Ons Electronic Signature(s) Signed: 10/10/2020 4:46:04 PM By: Karl Ito Signed: 10/10/2020 5:51:39 PM By: Zenaida Deed RN, BSN Entered By: Karl Ito on 10/10/2020 11:26:16 -------------------------------------------------------------------------------- Wound Assessment Details Patient Name: Date of Service: [name] Hakim. 10/10/2020 11:00 A M Medical Record Number: [id number] Patient Account Number: [id number] Date of Birth/Sex: Treating RN: 08-28-1950 (70 y.o. [name] Primary Care Dorismar Chay: Fatima Sanger Other Clinician: Referring Deasha Clendenin: Treating Merica Prell/Extender: Ebony Cargo in Treatment: 166 Wound Status Wound Number: 64 Primary Etiology: Diabetic Wound/Ulcer of the Lower Extremity Wound Location: Left, Dorsal Foot Wound Status: Open Wounding Event: Gradually Appeared Date Acquired: 06/01/2019 Weeks Of Treatment: 71 Clustered Wound: No Wound Measurements Length: (cm) 6 Width: (cm) 5 Depth: (cm) 0.1 Area: (cm) 23.562 Volume: (cm) 2.356 % Reduction in Area: -14180% % Reduction in Volume: -4708.2% Wound Description Classification: Grade 1 Treatment Notes Wound #64 (Foot) Wound Laterality: Dorsal, Left Cleanser Peri-Wound  Care Zinc Oxide Ointment 30g tube Discharge Instruction: Apply Zinc Oxiide to weeping areas with each dressing change Sween Lotion (Moisturizing lotion) Discharge Instruction: Apply moisturizing lotion as directed Topical Primary Dressing KerraCel Ag Gelling Fiber Dressing, 4x5 in (silver alginate) Discharge Instruction: Apply silver alginate to wound bed as instructed Secondary Dressing Zetuvit Plus 4x4 in Discharge Instruction: or equivalent extra absorbent pad.Apply over primary dressing as directed. Secured With Compression Wrap ThreePress (3 layer compression wrap) Discharge Instruction: Apply three layer compression as directed. Pad bend of ankle with foam or ABD pad. Compression Stockings Add-Ons Electronic Signature(s) Signed: 10/10/2020 4:46:04 PM By: Karl Ito Signed: 10/10/2020 5:51:39 PM By: Zenaida Deed RN, BSN Entered By: Karl Ito on 10/10/2020 11:26:16 -------------------------------------------------------------------------------- Wound Assessment Details Patient Name: Date of Service: [name] Hakim. 10/10/2020 11:00 A M Medical Record Number: [id number] Patient Account Number: [id number] Date of Birth/Sex: Treating RN: 02-06-1951 (70 y.o. [name] Primary Care Drey Shaff: Fatima Sanger Other Clinician: Referring Julea Hutto: Treating Scarleth Brame/Extender: Ebony Cargo in Treatment: 166 Wound Status Wound Number: 67 Primary Etiology: Diabetic Wound/Ulcer of the Lower Extremity Wound Location: Right, Medial Lower Leg Wound Status: Open Wounding Event: Gradually Appeared Date Acquired: 05/02/2020 Weeks Of Treatment: 23 Clustered Wound: No Wound Measurements Length: (cm) 10.5 Width: (cm) 7 Depth: (cm) 0.1 Area: (cm) 57.727 Volume: (cm) 5.773 % Reduction in Area: -3982.5% % Reduction  in Volume: -3994.3% Wound Description Classification: Grade 1 Treatment Notes Wound #67 (Lower Leg) Wound Laterality: Right,  Medial Cleanser Peri-Wound Care Zinc Oxide Ointment 30g tube Discharge Instruction: Apply Zinc Oxiide to weeping areas with each dressing change Sween Lotion (Moisturizing lotion) Discharge Instruction: Apply moisturizing lotion as directed Topical Primary Dressing KerraCel Ag Gelling Fiber Dressing, 4x5 in (silver alginate) Discharge Instruction: Apply silver alginate to wound bed as instructed Secondary Dressing Zetuvit Plus 4x4 in Discharge Instruction: or equivalent extra absorbent pad.Apply over primary dressing as directed. Secured With Compression Wrap Kerlix Roll 4.5x3.1 (in/yd) Discharge Instruction: Apply Kerlix and Coban compression as directed. Coban Self-Adherent Wrap 4x5 (in/yd) Discharge Instruction: Apply over Kerlix as directed. Compression Stockings Add-Ons Electronic Signature(s) Signed: 10/10/2020 4:46:04 PM By: Karl Ito Signed: 10/10/2020 5:51:39 PM By: Zenaida Deed RN, BSN Entered By: Karl Ito on 10/10/2020 11:26:16 -------------------------------------------------------------------------------- Wound Assessment Details Patient Name: Date of Service: [name] Hakim. 10/10/2020 11:00 A M Medical Record Number: [id number] Patient Account Number: [id number] Date of Birth/Sex: Treating RN: 1950/11/04 (70 y.o. [name] Primary Care Brady Plant: Fatima Sanger Other Clinician: Referring Mccormick Macon: Treating Latiqua Daloia/Extender: Ebony Cargo in Treatment: 166 Wound Status Wound Number: 69 Primary Etiology: Lymphedema Wound Location: Right, Dorsal Foot Secondary Etiology: Diabetic Wound/Ulcer of the Lower Extremity Wounding Event: Gradually Appeared Wound Status: Open Date Acquired: 07/25/2020 Weeks Of Treatment: 11 Clustered Wound: No Wound Measurements Length: (cm) 0.7 Width: (cm) 1.4 Depth: (cm) 0.1 Area: (cm) 0.77 Volume: (cm) 0.077 % Reduction in Area: 75.5% % Reduction in Volume: 75.5% Wound  Description Classification: Full Thickness Without Exposed Support Structur es Treatment Notes Wound #69 (Foot) Wound Laterality: Dorsal, Right Cleanser Peri-Wound Care Zinc Oxide Ointment 30g tube Discharge Instruction: Apply Zinc Oxiide to weeping areas with each dressing change Sween Lotion (Moisturizing lotion) Discharge Instruction: Apply moisturizing lotion as directed Topical Primary Dressing KerraCel Ag Gelling Fiber Dressing, 4x5 in (silver alginate) Discharge Instruction: Apply silver alginate to wound bed as instructed Secondary Dressing Zetuvit Plus 4x4 in Discharge Instruction: or equivalent extra absorbent pad.Apply over primary dressing as directed. Secured With Compression Wrap Kerlix Roll 4.5x3.1 (in/yd) Discharge Instruction: Apply Kerlix and Coban compression as directed. Coban Self-Adherent Wrap 4x5 (in/yd) Discharge Instruction: Apply over Kerlix as directed. Compression Stockings Add-Ons Electronic Signature(s) Signed: 10/10/2020 4:46:04 PM By: Karl Ito Signed: 10/10/2020 5:51:39 PM By: Zenaida Deed RN, BSN Entered By: Karl Ito on 10/10/2020 11:26:16 -------------------------------------------------------------------------------- Wound Assessment Details Patient Name: Date of Service: [name] Hakim. 10/10/2020 11:00 A M Medical Record Number: [id number] Patient Account Number: [id number] Date of Birth/Sex: Treating RN: 21-Jul-1950 (70 y.o. [name] Primary Care Jacy Brocker: Fatima Sanger Other Clinician: Referring Sabrin Dunlevy: Treating Concettina Leth/Extender: Ebony Cargo in Treatment: 166 Wound Status Wound Number: 70 Primary Etiology: Venous Leg Ulcer Wound Location: Right, Anterior Lower Leg Wound Status: Open Wounding Event: Gradually Appeared Date Acquired: 08/29/2020 Weeks Of Treatment: 6 Clustered Wound: No Wound Measurements Length: (cm) 9 Width: (cm) 6.1 Depth: (cm) 0.1 Area: (cm)  43.118 Volume: (cm) 4.312 % Reduction in Area: -3560.3% % Reduction in Volume: -3554.2% Wound Description Classification: Full Thickness Without Exposed Support Structur es Treatment Notes Wound #70 (Lower Leg) Wound Laterality: Right, Anterior Cleanser Peri-Wound Care Zinc Oxide Ointment 30g tube Discharge Instruction: Apply Zinc Oxiide to weeping areas with each dressing change Sween Lotion (Moisturizing lotion) Discharge Instruction: Apply moisturizing lotion as directed Topical Primary Dressing KerraCel Ag Gelling Fiber Dressing, 4x5 in (silver alginate) Discharge Instruction: Apply silver alginate to wound bed  as instructed Secondary Dressing Zetuvit Plus 4x4 in Discharge Instruction: or equivalent extra absorbent pad.Apply over primary dressing as directed. Secured With Compression Wrap Kerlix Roll 4.5x3.1 (in/yd) Discharge Instruction: Apply Kerlix and Coban compression as directed. Coban Self-Adherent Wrap 4x5 (in/yd) Discharge Instruction: Apply over Kerlix as directed. Compression Stockings Add-Ons Electronic Signature(s) Signed: 10/10/2020 4:46:04 PM By: Karl Ito Signed: 10/10/2020 5:51:39 PM By: Zenaida Deed RN, BSN Entered By: Karl Ito on 10/10/2020 11:26:16 -------------------------------------------------------------------------------- Vitals Details Patient Name: Date of Service: [name] Hakim. 10/10/2020 11:00 A M Medical Record Number: [id number] Patient Account Number: [id number] Date of Birth/Sex: Treating RN: 01/13/1951 (70 y.o. [name] Primary Care Ruqayyah Lute: Fatima Sanger Other Clinician: Referring Laquincy Eastridge: Treating Bijan Ridgley/Extender: Ebony Cargo in Treatment: 166 Vital Signs Time Taken: 11:25 Temperature (F): 98.3 Height (in): 62 Pulse (bpm): 112 Weight (lbs): 335 Respiratory Rate (breaths/min): 18 Body Mass Index (BMI): 61.3 Blood Pressure (mmHg): 153/77 Reference Range: 80 - 120 mg  / dl Electronic Signature(s) Signed: 10/10/2020 4:46:04 PM By: Karl Ito Entered By: Karl Ito on 10/10/2020 11:25:47

## 2020-10-23 NOTE — Progress Notes (Signed)
[name], [name] ([id number]) Visit Report for 10/10/2020 SuperBill Details Patient Name: Date of Service: [name], [name] 10/10/2020 Medical Record Number: [id number] Patient Account Number: [id number] Date of Birth/Sex: Treating RN: [birth year] (70 y.o. Debara Pickett, Millard.Loa Primary Care Provider: Fatima Sanger Other Clinician: Referring Provider: Treating Provider/Extender: Ebony Cargo in Treatment: 166 Diagnosis Coding ICD-10 Codes Code Description E11.622 Type 2 diabetes mellitus with other skin ulcer I89.0 Lymphedema, not elsewhere classified I87.331 Chronic venous hypertension (idiopathic) with ulcer and inflammation of right lower extremity I87.332 Chronic venous hypertension (idiopathic) with ulcer and inflammation of left lower extremity L97.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 leg with fat layer exposed L97.822 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

## 2020-10-24 ENCOUNTER — Other Ambulatory Visit (HOSPITAL_COMMUNITY)
Admission: RE | Admit: 2020-10-24 | Discharge: 2020-10-24 | Disposition: A | Discharge status: Home / Self Care | Payer: Medicare PPO | Source: Other Acute Inpatient Hospital | Attending: Physician Assistant | Admitting: Physician Assistant

## 2020-10-24 ENCOUNTER — Encounter (HOSPITAL_BASED_OUTPATIENT_CLINIC_OR_DEPARTMENT_OTHER): Payer: Medicare PPO | Attending: Physician Assistant | Admitting: Physician Assistant

## 2020-10-24 ENCOUNTER — Other Ambulatory Visit: Payer: Self-pay

## 2020-10-24 DIAGNOSIS — I87332 Chronic venous hypertension (idiopathic) with ulcer and inflammation of left lower extremity: Secondary | ICD-10-CM | POA: Insufficient documentation

## 2020-10-24 DIAGNOSIS — E11622 Type 2 diabetes mellitus with other skin ulcer: Secondary | ICD-10-CM | POA: Insufficient documentation

## 2020-10-24 DIAGNOSIS — I890 Lymphedema, not elsewhere classified: Secondary | ICD-10-CM | POA: Insufficient documentation

## 2020-10-24 DIAGNOSIS — B999 Unspecified infectious disease: Secondary | ICD-10-CM | POA: Diagnosis present

## 2020-10-24 DIAGNOSIS — F418 Other specified anxiety disorders: Secondary | ICD-10-CM | POA: Insufficient documentation

## 2020-10-24 DIAGNOSIS — E1140 Type 2 diabetes mellitus with diabetic neuropathy, unspecified: Secondary | ICD-10-CM | POA: Diagnosis not present

## 2020-10-24 DIAGNOSIS — R531 Weakness: Secondary | ICD-10-CM | POA: Insufficient documentation

## 2020-10-24 DIAGNOSIS — I10 Essential (primary) hypertension: Secondary | ICD-10-CM | POA: Diagnosis not present

## 2020-10-24 DIAGNOSIS — L97822 Non-pressure chronic ulcer of other part of left lower leg with fat layer exposed: Secondary | ICD-10-CM | POA: Insufficient documentation

## 2020-10-24 DIAGNOSIS — I87331 Chronic venous hypertension (idiopathic) with ulcer and inflammation of right lower extremity: Secondary | ICD-10-CM | POA: Diagnosis not present

## 2020-10-24 DIAGNOSIS — Z9119 Patient's noncompliance with other medical treatment and regimen: Secondary | ICD-10-CM | POA: Diagnosis not present

## 2020-10-24 DIAGNOSIS — E11621 Type 2 diabetes mellitus with foot ulcer: Secondary | ICD-10-CM | POA: Diagnosis not present

## 2020-10-24 DIAGNOSIS — L97522 Non-pressure chronic ulcer of other part of left foot with fat layer exposed: Secondary | ICD-10-CM | POA: Diagnosis not present

## 2020-10-24 DIAGNOSIS — L97819 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 leg with unspecified severity: Secondary | ICD-10-CM | POA: Diagnosis present

## 2020-10-24 DIAGNOSIS — L97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 leg with fat layer exposed: Secondary | ICD-10-CM | POA: Diagnosis not present

## 2020-10-24 NOTE — Progress Notes (Addendum)
[name], [name] ([id number]) Visit Report for 10/24/2020 Chief Complaint Document Details Patient Name: Date of Service: [name], [name] 10/24/2020 11:00 A M Medical Record Number: [id number] Patient Account Number: [id number] Date of Birth/Sex: Treating RN: 05/07/1951 (70 y.o. [name] Primary Care Provider: [name] Other Clinician: Referring Provider: Treating Provider/Extender: Darlen Round in Treatment: [place] from: Patient Chief Complaint Bilateral reoccurring LE ulcers Electronic Signature(s) Signed: 10/24/2020 12:00:47 PM By: [name] PA-C Entered By: [name] on 10/24/2020 12:00:47 -------------------------------------------------------------------------------- Debridement Details Patient Name: Date of Service: [name]. 10/24/2020 11:00 A M Medical Record Number: [id number] Patient Account Number: [id number] Date of Birth/Sex: Treating RN: Mar 12, 1951 (70 y.o. [name] Primary Care Provider: [name] Other Clinician: Referring Provider: Treating Provider/Extender: Darlen Round in Treatment: 168 Debridement Performed for Assessment: Wound #61 Left,Circumferential Lower Leg Performed By: Physician [name], PA Debridement Type: Debridement Severity of Tissue Pre Debridement: Fat layer exposed Level of Consciousness (Pre-procedure): Awake and Alert Pre-procedure Verification/Time Out Yes - 12:00 Taken: Start Time: 12:03 T Area Debrided (L x W): otal 10 (cm) x 15 (cm) = 150 (cm) Tissue and other material debrided: Skin: Epidermis, Fibrin/Exudate Level: Skin/Epidermis Debridement Description: Selective/Open Wound Instrument: Curette Bleeding: None Procedural Pain: 0 Post Procedural Pain: 0 Response to Treatment: Procedure was tolerated well Level of Consciousness (Post- Awake and Alert procedure): Post Debridement Measurements of Total Wound Length:  (cm) 14 Width: (cm) 32 Depth: (cm) 0.1 Volume: (cm) 35.186 Character of Wound/Ulcer Post Debridement: Requires Further Debridement Severity of Tissue Post Debridement: Fat layer exposed Post Procedure Diagnosis Same as Pre-procedure Electronic Signature(s) Signed: 10/24/2020 5:37:47 PM By: [name] PA-C Signed: 10/24/2020 6:13:52 PM By: [name] RN, BSN Entered By: [name] on 10/24/2020 12:15:59 -------------------------------------------------------------------------------- Debridement Details Patient Name: Date of Service: [name]. 10/24/2020 11:00 A M Medical Record Number: [id number] Patient Account Number: [id number] Date of Birth/Sex: Treating RN: 1950-07-18 (70 y.o. [name] Primary Care Provider: [name] Other Clinician: Referring Provider: Treating Provider/Extender: Darlen Round in Treatment: 168 Debridement Performed for Assessment: Wound #67 Right,Medial Lower Leg Performed By: Physician [name], PA Debridement Type: Debridement Severity of Tissue Pre Debridement: Fat layer exposed Level of Consciousness (Pre-procedure): Awake and Alert Pre-procedure Verification/Time Out Yes - 12:00 Taken: Start Time: 12:03 T Area Debrided (L x W): otal 6 (cm) x 6 (cm) = 36 (cm) Tissue and other material debrided: Non-Viable, Skin: Epidermis, Fibrin/Exudate Level: Skin/Epidermis Debridement Description: Selective/Open Wound Instrument: Curette Bleeding: None Procedural Pain: 0 Post Procedural Pain: 0 Response to Treatment: Procedure was tolerated well Level of Consciousness (Post- Awake and Alert procedure): Post Debridement Measurements of Total Wound Length: (cm) 4 Width: (cm) 6 Depth: (cm) 0.1 Volume: (cm) 1.885 Character of Wound/Ulcer Post Debridement: Requires Further Debridement Severity of Tissue Post Debridement: Fat layer exposed Post Procedure Diagnosis Same as  Pre-procedure Electronic Signature(s) Signed: 10/24/2020 5:37:47 PM By: [name] PA-C Signed: 10/24/2020 6:13:52 PM By: [name] RN, BSN Entered By: [name] on 10/24/2020 12:19:33 -------------------------------------------------------------------------------- HPI Details Patient Name: Date of Service: [name]. 10/24/2020 11:00 A M Medical Record Number: [id number] Patient Account Number: [id number] Date of Birth/Sex: Treating RN: January 14, 1951 (70 y.o. [name] Primary Care Provider: [name] Other Clinician: Referring Provider: Treating Provider/Extender: Darlen Round in Treatment: 168 History of Present Illness HPI Description: this patient has been seen  a couple of times before and returns with recurrent problems to her right and left lower extremity with swelling and weeping ulcerations due to not wearing her compression stockings which she had been advised to do during her last discharge, at the end of June 2018. During her last visit the patient had had normal arterial blood flow and her venous reflux study did not necessitate any surgical intervention. She was recommended compression and elevation and wound care. After prolonged treatment the patient was completely healed but she has been noncompliant with wearing or compressions.. She was here last week with an outpatient return visit planned but the patient came in a very poor general condition with altered mental status and was rushed to the ER on my request. With a history of hypertension, diabetes, TIA and right-sided weakness she was set up for an MRI on her brain and cervical spine and was sent to [facility]. Getting an MRI done was very difficult but once the workup was done she was found not to have any spinal stenosis, epidural abscess or hematoma or discitis. This was radiculopathy to be treated as an outpatient and she was given a follow-up appointment. Today  she is feeling much better alert and oriented and has come to reevaluate her bilateral lower extremity lymphedema and ulceration 03/25/2017 -- she was admitted to the hospital on 03/16/2017 and discharged on 03/18/2017 with left leg cellulitis and ulceration. She was started on vancomycin and Zosyn and x-ray showed no bony involvement. She was treated for a cellulitis with IV antibiotics changed to Rocephin and Flagyl and was discharged on oral Keflex and doxycycline to complete a 7 day course. Last hemoglobin A1c was 7.1 and her other ailments including hypertension got asthma were appropriately treated. 05/06/2017 -- she is awaiting the right size of compression stockings from [place] but other than that has been doing well. ====== Old notes 70 year old patient was seen one time last October and was lost to follow-up. She has recurrent problems with weeping and ulceration of her left lower extremity and has swelling of this for several years. It has been worse for the last 2 months. Past medical history is significant for diabetes mellitus type 2, hypertension, gout, morbid obesity, depressive disorders, hiatal hernia, migraines, status post knee surgery, risk of a cholecystectomy, vaginal hysterectomy and breast biopsy. She is not a smoker. As noted before she has never had a venous duplex study and an arterial ABI study was attempted but the left lower extremity was noncompressible 10/01/2016 -- had a lower extremity venous duplex reflux evaluation which showed no evidence of deep vein reflux in the right or left lower extremity, and no evidence of great saphenous vein reflux more than 500 ms in the right or left lower extremity, and the left small saphenous vein is incompetent but no vascular consult was recommended. review of her electronic medical records noted that the ABI was checked in July 2017 where the right ABI was normal limits and the left ABI could not be ascertained due to pain  with cuff pressure but the waveforms are within normal limits. her arterial duplex study scheduled for April 27. 10/08/2016 -- the patient has various reasons for not having a compression on and for the last 3 days she has had no compression on her left lower extremity either due to pain or the lack of nursing help. She does not use her juxta lites either. 10/15/2016 -- the patient did not keep her appointment for arterial duplex study  on April 27 and I have asked her to reschedule this. Her pain is out of proportion with the physical findings and she continuously fails to wear a compression wraps and cuts them off because she says she cannot tolerate the pain. She does not use her juxta lites either. 10/22/2016 -- he has rescheduled her arterial duplex study to May 21 and her pain today is a bit better. She has not been wearing her juxta lites on her right lower extremity but now understands that she needs to do this. She did tolerate the to press compression wrap on her left lower extremity 10/29/2016 --arterial duplex study is scheduled for next week and overall she has been tolerating her compression wraps and also using her juxta lites on her right lower extremity 11/05/2016 -- the right ABI was 0.95 the left was 1.03. The digit TBI is on the right was 0.83 on the left was 0.92 and she had biphasic flow through these vessels. The impression was that of normal lower extremity arterial study. 11/12/2016 -- her pain is minimal and she is doing very well overall. 11/26/2016 -- she has got juxta lites and her insurance will not pay for additional dual layer compression stockings. She is going to order some from [place]. 05/12/2017 -- her juxta lites are very old and too big for her and these have not been helping with compression. She did get 20-30 mm compression stockings from [place] but she and her husband are unable to put these on. I believe she will benefit from bilateral Extremit-ease,  compression stockings and we will measure her for these today. 05/20/2017 -- lymphedema on the left lower extremity has increased a lot and she has a open ulceration as a result of this. The right lower extremity is looking pretty good. She has decided to by the compression stockings herself and will get reimbursed by the home health, at a later date. 05/27/2017 -- her sciatica is bothering her a lot and she thought her left leg pain was caused due to the compression wrap and hence removed it and has significant lymphedema. There is no inflammation on this left lower extremity. 06/17/17 on evaluation today patient appears to be doing very well and in fact is completely healed in regard to her ulcerations. Unfortunately however she does have continued issues with lymphedema nonetheless. We did order compression garments for her unfortunately she states that the size that she received were large although we ordered medium. Obviously this means she is not getting the optimal compression. She does not have those with her today and therefore we could not confirm and contact the company on her behalf. Nonetheless she does state that she is going to have her husband bring them by tomorrow so that we can verify and then get in touch with the company. No fevers, chills, nausea, or vomiting noted at this time. Overall patient is doing better otherwise and I'm pleased with the progress she has made. 07/01/17 on evaluation today patient appears to be doing very well in regard to her bilateral lower extremity she does not have any openings at this point which is excellent news. Overall I'm pleased with how things have progressed up to this time. Since she is doing so well we did order her compression which we are seeing her today to ensure that it fits her properly and everything is doing well in that regard and then subsequently she will be discharged. ============ Old Notes: 03/31/16 patient presents today for  evaluation  concerning open wounds that she has over the left medial ankle region as well as the left dorsal foot. She has previously had this occur although it has been healed for a number of months after having this for about a year prior until her hospitalization on 01/05/16. At that point in time it appears that she was admitted to the hospital for left lower extremity cellulitis and was placed on vancomycin and Zosyn at that point. Eventually upon discharge on January 15, 2016 she was placed on doxycycline at that point in time. Later on 03/27/16 positive wound culture growing Escherichia coli this was switched to amoxicillin. Currently she tells me that she is having pain radiated to be a 7 out of 10 which can be as high as 10 out of 10 with palpation and manipulation of the wound. This wound appears to be mainly venous in nature due to the bilateral lower extremity venous stasis/lymphedema. This is definitely much worse on her left than the right side. She does have type 1 diabetes mellitus, hypertension, morbid obesity, and is wheelchair dependent.during the course of the hospital stay a blood culture was also obtained and fortunately appeared negative. She also had an x-ray of the tibia/fibula on the left which showed no acute bone abnormality. Her white blood cell count which was performed last on 03/25/16 was 7.3, hemoglobin 12.8, protein 7.1, albumin 3.0. Her urine culture appeared to be negative for any specific organisms. Patient did have a left lower extremity venous duplex evaluation for DVT . This did not include venous reflux studies but fortunately was negative for DVT Patient also had arterial studies performed which revealed that she had a . normal ABI on the right though this was unable to be performed on the left secondary to pain that she was having around the ankle region due to the wound. However it was stated on report that she had biphasic pulses and apparently good blood  flow. ========== 06/03/17 she is here in follow-up evaluation for right lower extremity ulcer. The right lower sure he has healed but she has reopened to the left medial malleolus and dorsal foot with weeping. She is waiting for new compression garments to arrive from home health, the previous compression garments were ill fitting. We will continue with compression bilaterally and follow-up in 2 weeks Readmission: 08/05/17 on evaluation today patient appears to be doing somewhat poorly in regard to her left lower extremity especially although the right lower extremity has a small area which may no longer be open. She has been having a lot of drainage from the left lower extremity however he tells me that she has not been able to use the EXTREMIT-EASE Compression at this point. She states that she did better and was able to actually apply the Juxta-Lite compression although the wound that she has is too large and therefore really does not compress which is why she cannot wear it at this point. She has no one who can help her put it on regular basis her son can sometimes but he's not able to do it most of the time. I do believe that's why she has begun to weave and have issues as she is currently yet again. No fevers, chills, nausea, or vomiting noted at this time. Patient is no evidence of dementia. 08/12/17 on evaluation today patient appears to still be doing fairly well in regard to the draining areas/weeping areas at this point. With that being said she unfortunately did go to the ER  yesterday due to what was felt to be possibly a cellulitis. They place her on doxycycline by mouth and discharge her home. She definitely was not admitted. With that being said she states she has had more discomfort which has been unusual for her even compared to prior times and she's had infections.08/12/17 on evaluation today patient appears to still be doing fairly well in regard to the draining areas/weeping areas at  this point. With that being said she unfortunately did go to the ER yesterday due to what was felt to be possibly a cellulitis. They place her on doxycycline by mouth and discharge her home. She definitely was not admitted. With that being said she states she has had more discomfort which has been unusual for her even compared to prior times and she's had infections. 08/19/17 put evaluation today patient tells me that she's been having a lot of what sounds to be neuropathic type pain in regard to her left lower extremity. She has been using over-the-counter topical bins again which some believe. That in order to apply the she actually remove the wrap we put on her last Wednesday on Thursday. Subsequently she has not had anything on compression wise since that time. The good news is a lot of the weeping areas appear to have closed at this point again I believe she would do better with compression but we are struggling to get her to actually use what she needs to at this point. No fevers, chills, nausea, or vomiting noted at this time. 09/03/17 on evaluation today patient appears to be doing okay in regard to her lower extremities in regard to the lymphedema and weeping. Fortunately she does not seem to show any signs of infection at this point she does have a little bit of weeping occurring in the right medial malleolus area. With that being said this does not appear to be too significant which is good news. 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 She has severe skin damage secondary to both of these features involving the dorsal left foot and medial left ankle and lower leg. Still has open areas in the left anterior foot. The area on the right closed over. She uses her own juxta light stockings. She does not have an arterial issue 09/16/17 on evaluation today patient actually appears to be doing excellent in regard to her bilateral  lower extremity swelling. The Juxta-Lite compression wrap seem to be doing very well for her. She has not however been using the portion that goes over her foot. Her left foot still is draining a little bit not nearly as significant as it has been in the past but still I do believe that she likely needs to utilize the full wrap including the foot portion of this will improve as well. She also has been apparently putting on a significant amount of Vaseline which also think is not helpful for her. I recommended that if she feels she needs something for moisturizer Eucerin will probably be better. 09/30/17 on evaluation today patient presents with several new open areas in regard to her left lower extremity although these appear to be minimal and mainly seem to be more moisture breakdown than anything. Fortunately she does not seem to have any evidence of infection which is great news. She has been tolerating the dressing changes without complication we are using silver alginate on the foot she has been using AB pads to have the legs and  using her Juxta- Lite compression which seems to be controlling her swelling very well. Overall I'm pleased with the poor way she has progressed. 10/14/17 on evaluation today patient appears to be doing better in regard to her left lower extremity areas of weeping. She does still have some discomfort although in general this does not appear to be as macerated and I think it is progressing nicely. I do think she still needs to wear the foot portion of her Juxta- Lite in order to get the most benefit from the wrap obviously. She states she understands. Fortunately there does not appear to be evidence of infection at this time which is great news. 10/28/17 on evaluation today patient appears to be doing excellent in regard to her left lower extremity. She has just a couple areas that are still open and seem to be causing any trouble whatsoever. For that reason I think that she  is definitely headed in the right direction the spots are very tiny compared to what we have been dealing with in the past. 11/11/17 on evaluation today patient appears to have a right lateral lower extremity ulcer that has opened since I last saw her. She states this is where the home health nurse that was coming out remove the dressing without wetting the alginate first. Nonetheless I do not know if this is indeed the case or not but more importantly we have not ordered home help to be coming out for her wounds at all. I'm unsure as to why they are coming out and we're gonna have to check on this and get things situated in that regard. With that being said we currently really do not need them to be coming out as the patient has been taking care of her leg herself without complication and no issues. In fact she was doing much better prior to nursing coming out. 11/25/17 on evaluation today patient actually appears to be doing fairly well in regard to her left lower extremity swelling. In fact she has very little area of weeping at this point there's just a small spot on the lateral portion of her right leg that still has me just a little bit more concerned as far as wanting to see this clear up before I discharge her to caring for this at home. Nonetheless overall she has made excellent progress. 12/09/17 on evaluation today patient appears to be doing rather well in regard to her lower extremity edema. She does have some weeping still in the left lower extremity although the big area we were taking care of two weeks ago actually has closed and she has another area of weeping on the left lower extremity immediately as well is the top of her foot. She does not currently have lymphedema pumps she has been wearing her compression daily on a regular basis as directed. With that being said I think she may benefit from lymphedema pumps. She has been wearing the compression on a regular basis since I've  been seeing her back in February 2019 through now and despite this she still continues to have issues with stage III lymphedema. We had a very difficult time getting and keeping this under control. 12/23/17 on evaluation today patient actually appears to be doing a little bit more poorly in regard to her bilateral lower extremities. She has been tolerating the Juxta-Lite compression wraps. Unfortunately she has two new ulcers on the right lower extremity and left lower Trinity ulceration seems to be larger. Obviously this is  not good news. She has been tolerating the dressings without complication. 12/30/17 on evaluation today patient actually appears to be doing much better in regard to her bilateral lower extremity edema. She continues to have some issues with ulcerations and in fact there appears to be one spot on each leg where the wrap may have caused a little bit of a blister which is subsequently opened up at this point is given her pain. Fortunately it does not appear to be any evidence of infection which is good news. No fevers chills noted. 01/13/18 on evaluation today patient appears to be doing rather well in regard to her bilateral lower extremities. The dressings did get kind of stuck as far as the wound beds are concerned but again I think this is mainly due to the fact that she actually seems to be showing signs of healing which is good news. She's not having as much drainage therefore she was having more of the dressing sticking. Nonetheless overall I feel like her swelling is dramatically down compared to previous. 01/20/18 on evaluation today patient unfortunately though she's doing better in most regards has a large blister on the left anterior lower extremity where she is draining quite significantly. Subsequently this is going to need debridement today in order to see what's underneath and ensure she does not continue to trapping fluid at this location. Nonetheless No fevers, chills,  nausea, or vomiting noted at this time. 01/27/18 on evaluation today patient appears to be doing rather well at this point in regard to her right lower extremity there's just a very small area that she still has open at this point. With that being said I do believe that she is tolerating the compression wraps very well in making good progress. Home health is coming out at this point to see her. Her left lower extremity on the lateral portion is actually what still mainly open and causing her some discomfort for the most part 02/10/18 on evaluation today patient actually appears to be doing very well in regard to her right lower extremity were all the ulcers appear to be completely close. In regard to the left lower extremity she does have two areas still open and some leaking from the dorsal surface of her foot but this still seems to be doing much better to me in general. 02/24/18 on evaluation today patient actually appears to be doing much better in regard to her right lower extremity this is still completely healed. Her left lower extremity is also doing much better fortunately she has no evidence of infection. The one area that is gonna require some debridement is still on the left anterior shin. Fortunately this is not hurting her as badly today. 03/10/18 on evaluation today patient appears to be doing better in some regards although she has a little bit more open area on the dorsal foot and she also has some issues on the medial portion of the left lower extremity which is actually new and somewhat deep. With that being said there fortunately does not appear to be any significant signs of infection which is good news. No fevers, chills, nausea, or vomiting noted at this time. In general her swelling seems to be doing fairly well which is good news. 03/31/18 on evaluation today patient presents for follow-up concerning her left lower extremity lymphedema. Unfortunately she has been doing a little bit  more poorly since I last saw her in regard to the amount of weeping that she is experiencing. She's  also having some increased pain in the anterior shin location. Unfortunately I do not feel like the patient is making such good progress at this point a few weeks back she was definitely doing much better. 04/07/18 on evaluation today patient actually appears to be showing some signs of improvement as far as the left lower extremity is concerned. She has been tolerating the dressing changes and it does appear that the Drawtex did better for her. With that being said unfortunately home health is stating that they cannot obtain the Drawtex going forward. Nonetheless we're gonna have to check and see what they may be able to get the alginate they were using was getting stuck in causing new areas of skin being pulled all that with and subsequently weep and calls her to worsen overall this is the first time we've seen improvement at this time. 04/14/18 on evaluation today patient actually appears to be doing rather well at this point there does not appear to be any evidence of infection at this time and she is actually doing excellent in regard to the weeping in fact she almost has no openings remaining even compared to just last week this is a dramatic improvement. No fevers chills noted 04/21/18 evaluation today patient actually appears to be doing very well. She in fact is has a small area on the posterior lower extremity location and she has a small area on the dorsal surface of her foot that are still open both of which are very close to closing. We're hoping this will be close shortly. She brought her Juxta-Lite wrap with her today hoping that would be able to put her in it unfortunately I don't think were quite at that point yet but we're getting closer. 04/28/18 upon evaluation today patient actually appears to be doing excellent in regard to her left lower extremity ulcer. In fact the region on the  posterior lower extremity actually is much smaller than previously noted. Overall I'm very happy with the progress she has made. She again did bring her Juxta-Lite although we're not quite ready for that yet. 05/11/18 upon evaluation today patient actually appears to be doing in general fairly well in regard to her left lower Trinity. The swelling is very well controlled. With that being said she has a new area on the left anterior lower extremity as well as between the first and second toes of her left foot that was not present during the last evaluation. The region of her posterior left lower extremity actually appears to be almost completely healed. T be honest I'm very pleased with o the way that stands. Nonetheless I do believe that the lotion may be keeping the area to moist as far as her legs are concerned subsequently I'm gonna consider discontinuing that today. 05/26/18 on evaluation today patient appears to be doing rather well in regard to her left lower should be ulcers. In fact everything appears to be close except for a very small area on the left posterior lower extremity. Fortunately there does not appear to be any evidence of infection at this time. Overall very pleased with her progress. 06/02/18 and evaluation today patient actually appears to be doing very well in regard to her lower extremity ulcers. She has one small area that still continues to weep that I think may benefit her being able to justify lotion and user Juxta-Lite wraps versus continued to wrap her. Nonetheless I think this is something we can definitely look into at this point. 06/23/18  on evaluation today patient unfortunately has openings of her bilateral lower extremities. In general she seems to be doing much worse than when I last saw her just as far as her overall health standpoint is concerned. She states that her discomfort is mainly due to neuropathy she's not having any other issues otherwise. No fevers,  chills, nausea, or vomiting noted at this time. 06/30/18 on evaluation today patient actually appears to be doing a little worse in regard to her right lower extremity her left lower extremity of doing fairly well. Fortunately there is no sign of infection at this time. She has been tolerating the dressing changes without complication. Home health did not come out like they were supposed to for the appropriate wrap changes. They stated that they never received the orders from [place] which were fax. Nonetheless we will send a copy of the orders with the patient today as well. 07/07/18 on evaluation today patient appears to be doing much better in regard to lower extremities. She still has several openings bilaterally although since I last saw her her legs did show obvious signs of infection when she later saw her nurse. Subsequently a culture was obtained and she is been placed on Bactrim and Keflex. Fortunately things seem to be looking much better it does appear she likely had an infection. Again last week we'd even discussed it but again there really was not any obvious sign that she had infection therefore we held off on the antibiotics. Nonetheless I'm glad she's doing better today. 07/14/18 on evaluation today patient appears to be doing much better regarding her bilateral lower Trinity's. In fact on the right lower for me there's nothing open at this point there are some dry skin areas at the sites where she had infection. Fortunately there is no evidence of systemic infection which is excellent news. No fevers chills noted 07/21/18 on evaluation today patient actually appears to be doing much better in regard to her left lower extremity ulcers. She is making good progress and overall I feel like she's improving each time I see her. She's having no pain I do feel like the infection is completely resolved which is excellent news. No fevers, chills, nausea, or vomiting noted at this time. 07/28/18 on  evaluation today patient appears to be doing very well in regard to her left lower [employer]. Everything seems to be showing signs of improvement which is excellent news. Overall very pleased with the progress that has been made. Fortunately there's no evidence of active infection at this time also excellent news. 08/04/18 on evaluation today patient appears to be doing more poorly in regard to her bilateral lower extremities. She has two new areas open up on the right and these were completely closed as of last week. She still has the two spots on the left which in my pinion seem to be doing better. Fortunately there's no evidence of infection again at this point. 08/11/18 on evaluation today patient actually appears to be doing very well in regard to her bilateral lower Trinity wounds that all seem to be doing better and are measures smaller today. Fortunately there's no signs of infection. No fevers, chills, nausea, or vomiting noted at this time. 08/18/18 on evaluation today patient actually appears to be doing about the same inverter bilateral lower extremities. She continues to have areas that blistering open as was drain that fortunately nothing too significant. Overall I feel like Drawtex may have done better for her however compared to  the collagen. 08/25/18 on evaluation today patient appears to be doing a little bit more poorly today even compared to last time I saw her. Again I'm not exactly sure why she's making worse progress over the past several weeks. I'm beginning to wonder if there is some kind of underlying low level infection causing this issue. I did actually take a culture from the left anterior lower extremity but it was a new wound draining quite a bit at this point. Unfortunately she also seems to be having more pain which is what also makes me worried about the possibility of infection. This is despite never erythema noted at this point. 09/01/18 on evaluation today patient  actually appears to be doing a little worse even compared to last week in regard to bilateral lower extremities. She did go to the hospital on the 16th was given a dose of IV Zosyn and then discharged with a recommendation to continue with the Bactrim that I previously prescribed for her. Nonetheless she is still having a lot of discomfort she tells me as well at this time. This is definitely unfortunate. No fevers, chills, nausea, or vomiting noted at this time. 09/08/18 on evaluation today patient's bilateral lower extremities actually appear to be shown signs of improvement which is good news. Fortunately there does not appear to be any signs of active infection I think the anabiotic is helping in this regard. Overall I'm very pleased with how she is progressing. 09/15/18 patient was actually seen in ER yesterday due to her legs as well unfortunately. She states she's been having a lot of pain and discomfort as well as a lot of drainage. Upon inspection today the patient does have a lot of swelling and drainage I feel like this is more related to lymphedema and poor fluid control than it is to infection based on what I'm seeing. The physician in the emergency department also doubted that the patient was having a significant infection nonetheless I see no evidence of infection obvious at this point although I do see evidence of poor fluid control. She still not using a compression pumps, she is not elevating due to her lift chair as well as her hospital bed being broken, and she really is not keeping her legs up as much as they should be and also has been taking off her wraps. All this combined I think has led to poor fluid control and to be honest she may be somewhat volume overloaded in general as well. I recommend that she may need to contact your physician to see if a prescription for a diuretic would be beneficial in their opinion. As long as this is safe I think it would likely help her. 09/29/18  on evaluation today patient's left lower extremity actually appears to be doing quite a bit better. At least compared to last time that I saw her. She still has a large area where she is draining from but there's a lot of new skin speckled trout and in fact there's more new skin that there are open areas of weeping and drainage at this point. This is good news. With regard to the right lower extremity this is doing much better with the only open area that I really see being a dry spot on the right lateral ankle currently. Fortunately there's no signs of active infection at this time which is good news. No fevers, chills, nausea, or vomiting noted at this time. The patient seems somewhat stressed and overwhelmed during the  visit today she was very lethargic as such. She does and she is not taking any pain medications at this point. Apparently according to her husband are also in the process of moving which is probably taking its toll on her as well. 10/06/18 on evaluation today patient appears to be doing rather well in regard to her lower extremities compared to last evaluation. Fortunately there's no signs of active infection. She tells me she did have an appointment with her primary. Nonetheless he was concerned that the wounds were somewhat deep based on pictures but we never actually saw her legs. She states that he had her somewhat worried due to the fact that she was fearing now that she was [place] have to have an amputation. With that being said based on what I'm seeing check she looks better this week that she has the last two times I've seen her with much less drainage I'm actually pleased in this regard. That doesn't mean that she's out of the water but again I do not think what the point of talking about education at all in regard to her leg. She is very happy to hear this. She is also not having as much pain as she was having last week. 10/13/18 unfortunately on evaluation today patient still  continues to have a significant amount of drainage she's not letting home health actually apply the compression dressings at this point. She's trying to use of Juxta-Lite of the top of Kerlex and the second layer of the three layer compression wrap. With that being said she just does not seem to be making as good a progress as I would expect if she was having the compression applied and in place on a regular basis. No fevers, chills, nausea, or vomiting noted at this time. 10/20/18 on evaluation today patient appears to be doing a little better in regard to her bilateral lower extremity ulcers. In fact the right lower extremity seems to be healed she doesn't even have any openings at this point left lower extremity though still somewhat macerated seems to be showing signs of new skin growth at multiple locations throughout. Fortunately there's no evidence of active infection at this time. No fevers, chills, nausea, or vomiting noted at this time. 10/27/18 on evaluation today patient appears to be doing much better in regard to her left lower Trinity ulcer. She's been tolerating the laptop complication and has minimal drainage noted at this point. Fortunately there's no signs of active infection at this time. No fevers, chills, nausea, or vomiting noted at this time. 11/03/18 on evaluation today patient actually appears to be doing excellent in regard to her left lower extremity. She is having very little drainage at this point there does not appear to be any significant signs of infection overall very pleased with how things have gone. She is likewise extremely pleased still and seems to be making wonderful progress week to week. I do believe antibiotics were helpful for her. Her primary care provider did place on amateur clean since I last saw her. 11/17/18 on evaluation today patient appears to be doing worse in regard to her bilateral lower extremities at this point. She is been tolerating the  dressing changes without complication. With that being said she typically takes the Coban off fairly quickly upon arriving home even after being seen here in the clinic and does not allow home health reapply command as part of the dressing at home. Therefore she said no compression essentially since I last  saw her as best I can tell. With that being said I think it shows and how much swelling she has in the open wounds that are noted at this point. Fortunately there's no signs of infection but unfortunately if she doesn't get this under control I think she will end up with infection and more significant issues. 11/24/18 on evaluation today patient actually appears to be doing somewhat better in regard to her bilateral lower extremities. 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 Nonetheless that is doing better currently so she should be able to attempt reinitiating the lymphedema pumps at this time. No fevers, chills, nausea, or vomiting noted at this time. 12/01/18 upon evaluation today patient's left lower extremity appears to be doing quite well unfortunately her right lower extremity is not doing nearly as well. She has been tolerating the dressing changes without complication unfortunately she did not keep a wrap on the right at this time. Nonetheless I believe this has led to increased swelling and weeping in the world is actually much larger than during the last evaluation with her. 12/08/18 on evaluation today patient appears to be doing about the same at this point in regard to her right lower extremity. There is some more palatable to touch I'm concerned about the possibility of there being some infection although I think the main issue is she's not keeping her compression wrap on which in turn is not allowing this area to heal appropriately. 12/22/18 on  evaluation today patient appears to be doing better in regard to left lower extremity unfortunately significantly worse in regard to the right lower extremity. The areas of blistering and necrotic superficial tissue have spread and again this does not really appear to be signs of infection and all she just doesn't seem to be doing nearly as well is what she has been in the past. Overall I feel like the Augmentin did absolutely nothing for her she doesn't seem to have any infection again I really didn't think so last time either is more of a potential preventative measure and hoping that this would make some difference but I think the main issue is she's not wearing her compression. She tells me she cannot wear the [place] been we put on she takes it off pretty much upon getting home. Subsequently she worshiped Juxta-Lite when I questioned her about how often she wears it this is no more than three hours a day obviously that leaves 21 hours that she has no compression and this is obviously not doing well for her. Overall I'm concerned that if things continue to worsen she is at great risk of both infection as well as losing her leg. 01/05/19 on evaluation today patient appears to be doing well in regard to her left lower extremity which he is allowing [place] to wrap and not so well with regard to her right lower extremity which she is not allowing [place] to really wrap and keep the wrap on. She states that it hurts too badly whenever it's wrapped and she ends up having to take it off. She's been using the Juxta-Lite she tells me up to six hours a day although I question whether or not that's really been the case to be honest. Previously she told me three hours today nonetheless obviously the legs as long  as the wrap is doing great when she is not is doing much more poorly. 01/12/2019 on evaluation today patient actually appears to be doing a little better in my opinion with regard to her right lower extremity  ulcer. She has a small open area on the left lower extremity unfortunately but again this I think is part of the normal fluctuation of what she is going to have to expect with regard to her legs especially when she is not using her lymphedema pumps on a regular basis. Subsequently based on what I am seeing today I think that she does seem to be doing slightly better with regard to her right lower extremity she did see her primary care provider on Monday they felt she had an infection and placed her on 2 antibiotics. Both Cipro and clindamycin. Subsequently again she seems possibly to be doing a little bit better in regards to the right lower extremity she also tells me however she has been wearing the compression wrap over the past week since I spoke with her as well that is a Kerlix and Coban wrap on the right. No fevers, chills, nausea, vomiting, or diarrhea. 01/19/2019 on evaluation today patient appears to be doing better with regard to her bilateral lower extremities especially the right. I feel like the compression has been beneficial for her which is great news. She did get a call from her primary care provider on her way here today telling her that she did have methicillin- resistant Staphylococcus aureus and he was calling in a couple new antibiotics for her including a ointment to be applied she tells me 3 times a day. With that being said this sounds like likely to be Bactroban which I think could be applied with each dressing/wrap change but I would not be able to accommodate her applying this 3 times a day. She is in agreement with the least doing this we will add that to her orders today. 01/26/2019 on evaluation today patient actually appears to be doing much better with regard to her right lower extremity. Her left lower extremity is also doing quite well all things considering. Fortunately there is no evidence of active infection at this time. No fevers, chills, nausea, vomiting, or  diarrhea. 02/02/2019 on evaluation today patient appears to be doing much better compared to her last evaluation. Little by little off like her right leg is returning more towards normal. There does not appear to be any signs of active infection and overall she seems to be doing quite well which is great news. I am very pleased in this regard. No fevers, chills, nausea, vomiting, or diarrhea. 02/09/2019 upon evaluation today patient appears to be doing better with regard to her bilateral lower extremities. She has been tolerating the dressing changes without complication. Fortunately there is no signs of active infection at this time. No fevers, chills, nausea, vomiting, or diarrhea. 02/23/2019 on evaluation today patient actually appears to be doing quite well with regard to her bilateral lower extremities. She has been tolerating the dressing changes without complication. She is even used her pumps one time and states that she really felt like it felt good. With that being said she seems to be in good spirits and her legs appear to be doing excellent. 03/09/2019 on evaluation today patient appears to be doing well with regard to her right lower extremity there are no open wounds at this time she is having some discomfort but I feel like this is more neuropathy  than anything. With regard to her left lower extremity she had several areas scattered around that she does have some weeping and drainage from but again overall she does not appear to be having any significant issues and no evidence of infection at this time which is good news. 03/23/2019 on evaluation today patient appears to be doing well with regard to her right lower extremity which she tells me is still close she is using her juxta light here. Her left lower extremity she mainly just has an area on the foot which is still slightly draining although this also is doing great. Overall very pleased at this time. 04/06/2019 patient appears to be  doing a little bit worse in regard to her left lower extremity upon evaluation today. She feels like this could be becoming infected again which she had issues with previous. Fortunately there is no signs of systemic infection but again this is always a struggle with her with her legs she will go from doing well to not so well in a very short amount of time. 04/20/2019 on evaluation today patient actually appears to be doing quite well with regard to her right lower extremity I do not see any signs of active infection at this time. Fortunately there is no fever chills noted. She is still taking the antibiotics which I prescribed for her at this point. In regard to the left lower extremity I do feel like some of these areas are better although again she still is having weeping from several locations at this time. 04/27/2019 on evaluation today patient appears to be doing about the same if not slightly worse in regard to her left lower extremity ulcers. She tells me when questioned that she has been sleeping in her Hoveround chair in fact she tells me she falls asleep without even knowing it. I think she is spending a whole lot of time in the chair and less time walking and moving around which is not good for her legs either. On top of that she is in a seated position which is also the worst position she is not really elevating her legs and she is also not using her lymphedema pumps. All this is good to contribute to worsening of her condition in general. 05/18/2019 on evaluation today patient appears to be doing well with regard to her lower extremity on the right in fact this is showing no signs of any open wounds at this time. On the left she is continuing to have issues with areas that do drain. Some of the regions have healed and there are couple areas that have reopened. She did go to the ER per the patient according to recommendations from the home health nurse due to what she was seen when she came  out on 05/13/2019. Subsequently she felt like the patient needed to go to the hospital due to the fact that again she was having "milky white discharge" from her leg. Nonetheless she had and then was placed on doxycycline and subsequently seems to be doing better. 06/01/2019 upon evaluation today patient appears to be doing really in my opinion about the same. I do not see any signs of active infection which is good news. Overall she still has wounds over the bilateral lower extremities she has reopened on the right but this appears to be more of a crack where there is weeping/edema coming from the region. I do not see any evidence of infection at either site based on what I visualized  today. 07/13/2019 upon evaluation today patient appears to be doing a little worse compared to last time I saw her. She since has been in the hospital from 06/21/2019 through 06/29/2019. This was secondary to having Covid. During that time they did apply lotion to her legs which unfortunately has caused her to develop a myriad of open wounds on her lower extremities. Her legs do appear to be doing better as far as the overall appearance is concerned but nonetheless she does have more open and weeping areas. 07/27/2019 upon evaluation today patient appears to be doing more poorly to be honest in regard to her left lower extremity in particular. There is no signs of systemic infection although I do believe she may have local infection. She notes she has been having a lot of blue/green drainage which is consistent potentially with Pseudomonas. That may be something that we need to consider here as well. The doxycycline does not seem to have been helping. 08/03/2019 upon evaluation today patient appears to be doing a little better in my opinion compared to last week's evaluation. Her culture I did review today and she is on appropriate medications to help treat the Enterobacter that was noted. Overall I feel like that is good  news. With that being said she is unfortunately continuing to have a lot of drainage and though it is doing better I still think she has a long ways to go to get things dried up in general. Fortunately there is no signs of systemic infection. 08/10/2019 upon evaluation today patient appears to be doing may be slightly better in regard to her left lower extremity the right lower extremity is doing much better. Fortunately there is no signs of infection right now which is good news. No fevers, chills, nausea, vomiting, or diarrhea. 08/24/2019 on evaluation today patient appears to be doing slightly better in regard to her lower extremities. The left lower extremity seems to be healed the right lower extremity is doing better though not completely healed as far as the openings are concerned. She has some generalized issues here with edema and weeping secondary to her lymphedema though again I do believe this is little bit drier compared to prior weeks evaluations. In general I am very pleased with how things seem to be progressing. No fevers, chills, nausea, vomiting, or diarrhea. 08/31/2019 upon evaluation today patient actually seems to making some progress here with regard to the left lower extremity in particular. She has been tolerating the dressing changes without complication. Fortunately there is no signs of active infection at this time. No fevers, chills, nausea, vomiting, or diarrhea. She did see Dr. Doren Custard and he did note that she did have a issue with the left great saphenous vein and the small saphenous vein in the leg. With that being said he was concerned about the possibility of laser ablation not being extremely successful. He also mentioned a small risk of DVT associated with the procedure. However if the wounds do not continue to improve he stated that that would probably be the way to go. Fortunately the patient's legs do seem to be doing much better. 09/07/2019 upon evaluation today  patient appears to be doing better with regard to her lower extremities. She has been tolerating the dressing changes without complication. With that being said she is showing signs of improvement and overall very pleased. There are some areas on her leg that I think we do need to debride we discussed this last week the patient is  in agreement with doing that as long as it does not hurt too badly. 09/14/2019 upon evaluation today patient appears to be doing decently well with regard to her left lower extremity. She is not having near as much weeping as she has had in the past things seem to be drying up which is good news. There is no signs of active infection at this time. 09/21/19 upon evaluation today patient appears to be doing better in regard overall to her bilateral lower extremities. She again has less open than she did previous and each week I feel like this is getting better. Fortunately there is no signs of active infection at this time. No fevers, chills, nausea, vomiting, or diarrhea. 09/28/2019 upon evaluation today patient actually appears to be showing signs of improvement with regard to her left lower extremity. Unfortunately the right medial lower extremity around the ankle region has reopened to some degree but this appears to be minimal still which is good news. There is no signs of active infection at this time which is also good news. 10/12/2019 upon evaluation today patient appears to be doing okay with regard to her bilateral lower extremities today. The right is a little bit worse then last evaluation 2 weeks ago. The left is actually doing a little better in my opinion. Overall there is no signs of active infection at this time that I see. Obviously that something we have to keep a close eye on she is very prone to this with the significant and multiple openings that she has over the bilateral lower extremities. 10/19/2019 upon evaluation today patient appears to be doing about the  best that I have seen her in quite some time. She has been tolerating the dressing changes without complication. There does not appear to be any signs of active infection and overall I am extremely happy with the way her legs appeared. She is drying up quite nicely and overall is having less pain. 11/09/2019 upon evaluation today patient appears to be doing better in regard to her wounds. She seems to be drying up more and more each time I see her this is just taking a very long time. Fortunately there is no signs of active infection at this time. 11/23/2019 upon evaluation today patient actually appears to be doing excellent in regard to her lower extremities at this point compared to where she has been. Fortunately there is no signs of active infection at this time. She did go to the hospital last week for nausea and vomiting completely unrelated to her wounds. Fortunately she is doing better she was given some Reglan and got better. She had associated abdominal pain but they never found out what was going on. 12/07/2019 upon evaluation today patient appears to be doing well for the most part in regard to her legs. She unfortunately has not been keeping the [place] portion of her wraps on therefore the compression has not really been sufficient for what it is supposed to be. Nonetheless she tells me that it just hurt too bad therefore she removed it. 12/21/2019 upon evaluation today patient actually appears to be doing quite well with regard to her legs. I do feel like she has been making progress which is great news and overall there is no signs of active infection at this time. No fevers, chills, nausea, vomiting, or diarrhea. 01/04/2020 upon evaluation today patient presents for follow-up concerning her lower extremity edema bilaterally. She still has open wounds she has not been using her  lymphedema pumps. She is also not been utilizing her compression wraps appropriately she tends to unwrap them, take  them off, or states that they hurt. Obviously the reason they hurt is because her legs start to swell but the issue is if she would use her compression/lymphedema pumps regularly she would not swell and she would have the pain. Nonetheless she has not even picked them up once honestly over the past several months and may be even as much as in the past year based on my opinion and what have seen. She tells me today that after last week when I talked about this with her specifically actually that was 2 weeks ago that she "forgot". 8//21 on evaluation today patient appears to be doing a little better in regard to her legs bilaterally. Fortunately there is no signs of active infection at this time. She tells me that she used her lymphedema pumps all of one time over the past 2 weeks since I last saw her. She tells me that she has been too busy in order to continue to use these. 02/01/2020 on evaluation today patient appears to be doing some better in regard to her wounds in general in her legs. We felt the right was healed although is not completely it does appear to be doing better she tells me she has been using her lymphedema pumps that she has had this six times since I last saw her. Obviously the more she does that the better she would do my opinion 02/15/2020 upon evaluation today patient appears to be doing about the same in regard to her legs. She tells me that she is pumping I'm still not sure how much she does to be perfectly honest. However even if she does a little bit here and there I guess that is better than nothing. Fortunately there is no sign of active infection at this time which is great news. No fevers, chills, nausea, vomiting, or diarrhea. 02/29/2020 on evaluation today patient actually appears to be doing quite well all things considered this week. She has been tolerating the dressing changes without complication. Fortunately there is no signs of active infection at this time. No  fevers, chills, nausea, vomiting, or diarrhea. 03/14/2020 upon evaluation today patient appears to be doing really about the same in regard to her legs. There is no signs of improvement overall and she as noted from home health does not appear to be elevating her legs he can get into her lift chair. There is too much stuff piled up on it the patient tells me. She also tells me she cannot really use her pumps effectively due to the fact that she cannot have any space to get them on. Finally she is also not really elevating her legs because she is not sleeping in her bed she is sleeping in her chair currently and again overall I think everything that she is done in combination has been exactly the wrong thing for what she needs for her legs. 04/04/2020 upon evaluation today patient appears to be doing well at this time with regard to her legs. She is actually been pumping, keeping her wraps on, and to be honest she seems to be doing dramatically better the right leg is excellent the left leg is also excellent and measuring much smaller than previous. 04/18/2020 upon evaluation today patient actually is continue to make good progress in regard to her lower extremities bilaterally. Everything is improving and less wet that has been in the  past overall I am extremely pleased with where things stand and I think that she is making great progress. The patient tells me she still continue to use her compression pumps 05/02/2020 on evaluation today patient appears to be doing well at this time in regard to her left leg which is showing signs of drying up. With that being said she does have a lot of lymphedema type crusty skin around the toes of her left foot and the right medial ankle which has opened at this point. Fortunately there is no signs of active infection systemically at this point or even locally for that matter. 05/23/2020 on evaluation today patient appears to be doing well with regard to her lower  extremities. Fortunately there is no signs of active infection at this time. No fever chills noted. She has been very depressed however she tells me. 06/06/2020 patient came in today for evaluation in regard to her bilateral lower extremity ulcerations. With that being said she came in feeling okay and actually laughing and joking around with the staff checking her in. Subsequently however she had a coughing spell and following the coughing spell it was a dramatic conversion from being jovial and joking around to being extremely short of breath her vital signs actually dropped in regard to her blood pressure from around 175 to down around 354 for systolic and from around 96 diastolic down to around 70. With that being said she also accompanied this with an increase in her respiratory rate which was also quite significant. Nonetheless I actually upon going into see her was extremely worried we called EMS to have her transported to the ER for further evaluation and treatment. She continued until EMS got here to be extremely short of breath even on 6 L of oxygen. Her oxygen saturation did come up to 99% but overall it was only 95 before which was not terrible she did feel like the oxygen helped her feel somewhat better however. She has a history of asthma but again even the coughing spell really should not have done this degree of alteration in her demeanor from where she was just before to after the coughing spell. She tells [place] however she has been feeling somewhat abnormal since Friday although she could not really pinpoint exactly what was going on. 06/27/2020 plan evaluation today patient appears to be doing okay in regard to her leg ulcers. She is really not showing a lot of improvement to be honest and she still has a lot of weeping. With that being said after I last saw her we called EMS to take her to the hospital she actually did not go she went to see her primary care provider who according to  the patient have not identified and read through the note states that everything checked out okay. Nonetheless the patient has continued to have bouts where she gets very short of breath for seemingly no reason whatsoever. That happened even the same night that I saw her last time. This was after she refused transport to the hospital. With that being said she also tells me that just getting dressed to come here is quite a chore which is putting on her shirt causing her to have to stop to catch her breath. Obviously this is not normal and I feel like there is something going on. She may need a referral to be seen by cardiology ASAP. She does see her primary care provider early next week she tells me 07/11/2020 upon evaluation today  patient's wounds again appear to be doing about the same she mainly has weeping of the bilateral lower extremities both are very swollen today she tells me she has been using her pumps but last time I saw her she told me she did not even have her pumps out that she had "packed them away because they had too much stuff in their house. Apparently her husband has been bringing stuff in from storage units. She then subsequently told me that she had so much stuff in her house that she has been staying up all night and did not go to bed till 7:00 this morning because she was cleaning up stuff. Nonetheless I am still unsure as to whether or not she is using the pumps based on what I am seeing I would think probably not. Nonetheless she has been keeping the compression wraps in place that is at least something 07/25/2020 upon evaluation today patient appears to be doing well currently in regard to her legs all things considered. I do not think she is doing any worse significantly although honestly I do not think she is doing a lot better either. There does not appear to be any signs of infection which is good news although she does have a lot of weeping. She is using her pumps she tells  me sporadically though she says that her husband is not a big fan of helping her to get the [place]. She also tells me is difficult because in their house. It would be easiest for her to use these as where her son is actually staying with her and living out at this point. Obviously I understand that she is having a lot of issues here and to be honest I do not really know what to say in that regard and the fact that I really cannot change the situations but I do believe she really needs to be using the lymphedema pumps to try to keep things under control here. Outside of that I think she is going to continue to have significant issues. I think the pumps coupled with good compression and elevation are the only things that are to help her at this point. 08/08/2020 upon evaluation today patient appears to be doing a little worse in regard to her legs in general. I think this may be due to some infection currently. With that being said I am going to go ahead and likely see about putting her on an antibiotic. Were also can obtain a culture today where she had some purulent drainage. 08/29/2020 upon evaluation today patient appears to be doing about the same in regard to her bilateral lower extremities. Unfortunately she is continuing to have significant issues here with edema and she has significant lymphedema. With that being said she is really not doing anything that she is supposed to be doing as far as elevation, compression, using lymphedema pumps or anything really for that matter. She does not use her pumps, is taken the wraps off shortly after having them put on as far as home health is concerned at least by the next day she tells me, and overall is not really elevating her legs she is telling me she has a lot to do as far as cleaning up in her house. Nonetheless this triad of noncompliance is leading to worsening not improving in regard to her legs. I had a very strong conversation with her about this  today again. 09/12/2020 on evaluation today patient appears to be doing poorly  with regard to her lower extremities bilaterally. Fortunately there does not appear to be any signs of active infection at this point. That is systemically. Locally there is some signs of pus coming from an area on her left leg. She has been on antibiotics recently though she is done with those currently. Nonetheless she tells me she has been having increased pain with the right leg this is extremely swollen as well. Fortunately there does not appear to be significant infection of the right leg though I think her pain is really as result of the increased swelling she has been declining the wrap on the right leg and the wounds here are getting significantly worse week by week. This obviously is not will be want to see. I discussed with the patient however if she does not use the wraps, use her pumps, and elevate her legs that she is not to get better. She has been sleeping in her motorized Hoveround which does not even allow her to elevate her legs at all. She tells me that she is also constantly "cleaning up as her husband told her that he wants to move back to [place]." Unfortunately this has been the narrative for the past several months in fact that she tells me that she has been having to clean up mass that was moved in from storage units into their home and that she does not really sleep and she never really elevates her legs. I explained to the patient today that if she is not can I do any of this that there is really nothing that I can do to help her. 09/26/20 upon evaluation today patient appears to be doing about the same in regard to her wounds. Fortunately there is no signs of active infection at this time. No fever chills noted. She has been tolerating the dressing changes without complication which is good news. With that being said I do think that she unfortunately is still having a lot of pain but I think  this is due to the swelling that really is not controlled and as I discussed with her last time she needs to be using her lymphedema pumps there is still in the box she has not even attempted to use those whatsoever. I am also not even certain she is taking her fluid pills as she states she may need to have "get a refill" she was seen with her husband at the appointment today. 10/24/2020 upon evaluation today patient appears to be doing decently well in regard to her legs all things considered. She has been tolerating the dressing changes without complication. Fortunately there is no signs of active infection at this time. No fevers, chills, nausea, vomiting, or diarrhea. With that being said the patient does appear to have some excessive thickened skin buildup currently that has been require some sharp debridement to clear this away. I am hopeful if we do this we will be able to get some of this area to dry out this otherwise trapping fluid underneath. Her son is present during the office visit today. She tells me she still been doing a lot as far as cleaning up and going through boxes she tells me she has been up for a long time already this morning. Electronic Signature(s) Signed: 10/24/2020 2:35:48 PM By: [name] PA-C Entered By: [name] on 10/24/2020 14:35:48 -------------------------------------------------------------------------------- Physical Exam Details Patient Name: Date of Service: [name], [name] 10/24/2020 11:00 A M Medical Record Number: [id number] Patient Account  Number: [id number] Date of Birth/Sex: Treating RN: 19-Jan-1951 (70 y.o. [name] Primary Care Provider: [name] Other Clinician: Referring Provider: Treating Provider/Extender: Darlen Round in Treatment: 23 Constitutional Well-nourished and well-hydrated in no acute distress. Respiratory normal breathing without difficulty. Psychiatric this patient is able to make  decisions and demonstrates good insight into disease process. Alert and Oriented x 3. pleasant and cooperative. Notes Upon inspection patient's wounds again are not really wounds per se as much as it is just a generalized weeping secondary to lymphedema. Unfortunately this does not seem to be doing as well as I would like to see. She is also not really using the pumps as readily as she should be. She has kept the wraps on her legs have them on today when she came in which is at least an improvement. We did discuss using the pumps as well as keeping her legs elevated today she also needs to not be staying up all night to clean out boxes. Electronic Signature(s) Signed: 10/24/2020 2:36:22 PM By: [name] PA-C Entered By: [name] on 10/24/2020 14:36:22 -------------------------------------------------------------------------------- Physician Orders Details Patient Name: Date of Service: [name]. 10/24/2020 11:00 A M Medical Record Number: [id number] Patient Account Number: [id number] Date of Birth/Sex: Treating RN: 03-09-1951 (70 y.o. [name] Primary Care Provider: [name] Other Clinician: Referring Provider: Treating Provider/Extender: Darlen Round in Treatment: [phone] Verbal / Phone Orders: No Diagnosis Coding ICD-10 Coding Code Description E11.622 Type 2 diabetes mellitus with other skin ulcer I89.0 Lymphedema, not elsewhere classified I87.331 Chronic venous hypertension (idiopathic) with ulcer and inflammation of right lower extremity I87.332 Chronic venous hypertension (idiopathic) with ulcer and inflammation of left lower extremity L97.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 leg with fat layer exposed L97.822 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 Bathing/ Shower/ Hygiene May shower and wash wound with soap and water. - with dressing changes, wash both legs with wash cloth and soap and water with dressing changes Other Bathing/Shower/Hygiene Orders/Instructions: - Home health to wash legs with warn soapy water and a clean wash cloth with dressing changes Edema Control - Lymphedema / SCD / Other Bilateral Lower Extremities Lymphedema Pumps. Use Lymphedema pumps on leg(s) 2-3 times a day for 45-60 minutes. If wearing any wraps or hose, do not remove them. Continue exercising as instructed. Elevate legs to the level of the heart or above for 30 minutes daily and/or when sitting, a frequency of: - especially at night Avoid standing for long periods of time. Exercise regularly [place] wound care orders this week; continue Home Health for wound care. May utilize formulary equivalent dressing for wound treatment orders unless otherwise specified. - change to calcium alginate, hold zinc oxide paste Dressing changes to be completed by [place] on Monday / Wednesday / Friday except when patient has scheduled visit at [facility]. Other Home Health Orders/Instructions: - [facility] Wound #61 - Lower Leg Wound Laterality: Left, Circumferential Peri-Wound Care: Sween Lotion (Moisturizing lotion) (Home Health) 3 x Per Day/30 Days Discharge Instructions: Apply moisturizing lotion to dry skin on legs Prim Dressing: Maxorb Extra Calcium Alginate 2x2 in (Home  Health) 3 x Per Day/30 Days ary Discharge Instructions: Apply calcium alginate to wound bed as instructed Secondary Dressing: ABD Pad, 8x10 (Home Health) 3 x Per Day/30 Days Discharge Instructions: Apply over primary dressing as directed. Secondary Dressing: Zetuvit Plus 4x4 in (Home  Health) 3 x Per Day/30 Days Discharge Instructions: or equivalent extra absorbent pad.Apply over primary dressing as directed. Compression Wrap: ThreePress (3 layer compression wrap) (Home Health) 3 x Per Day/30 Days Discharge Instructions: Apply three layer compression as directed. Pad bend of ankle with foam or ABD pad. Wound #64 - Foot Wound Laterality: Dorsal, Left Peri-Wound Care: Sween Lotion (Moisturizing lotion) (Home Health) 3 x Per Day/30 Days Discharge Instructions: Apply moisturizing lotion to dry skin on legs Prim Dressing: Maxorb Extra Calcium Alginate 2x2 in (Home Health) 3 x Per Day/30 Days ary Discharge Instructions: Apply calcium alginate to wound bed as instructed Secondary Dressing: ABD Pad, 8x10 (Home Health) 3 x Per Day/30 Days Discharge Instructions: Apply over primary dressing as directed. Secondary Dressing: Zetuvit Plus 4x4 in (Home Health) 3 x Per Day/30 Days Discharge Instructions: or equivalent extra absorbent pad.Apply over primary dressing as directed. Compression Wrap: ThreePress (3 layer compression wrap) (Home Health) 3 x Per Day/30 Days Discharge Instructions: Apply three layer compression as directed. Pad bend of ankle with foam or ABD pad. Wound #67 - Lower Leg Wound Laterality: Right, Medial Peri-Wound Care: Sween Lotion (Moisturizing lotion) (Home Health) 3 x Per Day/30 Days Discharge Instructions: Apply moisturizing lotion to dry skin on legs Prim Dressing: Maxorb Extra Calcium Alginate 2x2 in (Home Health) 3 x Per Day/30 Days ary Discharge Instructions: Apply calcium alginate to wound bed as instructed Secondary Dressing: ABD Pad, 8x10 (Home Health) 3 x Per Day/30 Days Discharge Instructions: Apply over primary dressing as directed. Secondary Dressing: Zetuvit Plus 4x4 in (Home Health) 3 x Per Day/30 Days Discharge Instructions: or equivalent extra absorbent pad.Apply over primary dressing as directed. Compression Wrap: ThreePress (3 layer  compression wrap) (Home Health) 3 x Per Day/30 Days Discharge Instructions: Apply three layer compression as directed. Pad bend of ankle with foam or ABD pad. Wound #69 - Foot Wound Laterality: Dorsal, Right Peri-Wound Care: Sween Lotion (Moisturizing lotion) (Home Health) 3 x Per Day/30 Days Discharge Instructions: Apply moisturizing lotion to dry skin on legs Prim Dressing: Maxorb Extra Calcium Alginate 2x2 in (Home Health) 3 x Per Day/30 Days ary Discharge Instructions: Apply calcium alginate to wound bed as instructed Secondary Dressing: ABD Pad, 8x10 (Home Health) 3 x Per Day/30 Days Discharge Instructions: Apply over primary dressing as directed. Secondary Dressing: Zetuvit Plus 4x4 in (Home Health) 3 x Per Day/30 Days Discharge Instructions: or equivalent extra absorbent pad.Apply over primary dressing as directed. Compression Wrap: ThreePress (3 layer compression wrap) (Home Health) 3 x Per Day/30 Days Discharge Instructions: Apply three layer compression as directed. Pad bend of ankle with foam or ABD pad. Wound #70 - Lower Leg Wound Laterality: Right, Anterior Peri-Wound Care: Sween Lotion (Moisturizing lotion) (Home Health) 3 x Per Day/30 Days Discharge Instructions: Apply moisturizing lotion to dry skin on legs Prim Dressing: Maxorb Extra Calcium Alginate 2x2 in (Home Health) 3 x Per Day/30 Days ary Discharge Instructions: Apply calcium alginate to wound bed as instructed Secondary Dressing: ABD Pad, 8x10 (Home Health) 3 x Per Day/30 Days Discharge Instructions: Apply over primary dressing as directed. Secondary Dressing: Zetuvit Plus 4x4 in (Home Health) 3 x Per Day/30 Days Discharge Instructions: or equivalent extra absorbent pad.Apply over primary dressing as directed. Compression Wrap:  ThreePress (3 layer compression wrap) (Home Health) 3 x Per Day/30 Days Discharge Instructions: Apply three layer compression as directed. Pad bend of ankle with foam or ABD  pad. Laboratory naerobe culture (MICRO) - right lower leg Bacteria identified in Unspecified specimen by A LOINC Code: 614-4 Convenience Name: Anerobic culture Electronic Signature(s) Signed: 10/24/2020 5:37:47 PM By: [name] PA-C Signed: 10/24/2020 6:13:52 PM By: [name] RN, BSN Entered By: [name] on 10/24/2020 12:15:09 -------------------------------------------------------------------------------- Problem List Details Patient Name: Date of Service: [name]. 10/24/2020 11:00 A M Medical Record Number: [id number] Patient Account Number: [id number] Date of Birth/Sex: Treating RN: [birth year] (70 y.o. Martyn Malay, Vaughan Basta Primary Care Provider: [name] Other Clinician: Referring Provider: Treating Provider/Extender: Darlen Round in Treatment: [id number] Active Problems ICD-10 Encounter Code Description Active Date MDM Diagnosis E11.622 Type 2 diabetes mellitus with other skin ulcer 08/05/2017 No Yes I89.0 Lymphedema, not elsewhere classified 08/05/2017 No Yes I87.331 Chronic venous hypertension (idiopathic) with ulcer and inflammation of right 08/05/2017 No Yes lower extremity I87.332 Chronic venous hypertension (idiopathic) with ulcer and inflammation of left 08/05/2017 No Yes lower extremity L97.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 leg with fat layer 08/05/2017 No Yes exposed L97.822 Non-pressure chronic ulcer of other part of left lower leg with fat layer exposed2/20/2019 No Yes L97.522 Non-pressure chronic ulcer of other part of left foot with fat layer exposed 06/01/2019 No Yes I10 Essential (primary) hypertension 08/05/2017 No Yes E66.01 Morbid (severe) obesity due to excess calories 08/05/2017 No Yes F41.8 Other specified anxiety disorders 08/05/2017 No Yes R53.1 Weakness 08/05/2017 No Yes Inactive Problems Resolved Problems Electronic Signature(s) Signed: 10/24/2020 12:00:40 PM By: [name]  PA-C Entered By: [name] on 10/24/2020 12:00:40 -------------------------------------------------------------------------------- Progress Note Details Patient Name: Date of Service: [name]. 10/24/2020 11:00 A M Medical Record Number: [id number] Patient Account Number: [id number] Date of Birth/Sex: Treating RN: Oct 18, 1950 (70 y.o. [name] Primary Care Provider: [name] Other Clinician: Referring Provider: Treating Provider/Extender: Darlen Round in Treatment: 168 Subjective Chief Complaint Information obtained from Patient Bilateral reoccurring LE ulcers History of Present Illness (HPI) this patient has been seen a couple of times before and returns with recurrent problems to her right and left lower extremity with swelling and weeping ulcerations due to not wearing her compression stockings which she had been advised to do during her last discharge, at the end of June 2018. During her last visit the patient had had normal arterial blood flow and her venous reflux study did not necessitate any surgical intervention. She was recommended compression and elevation and wound care. After prolonged treatment the patient was completely healed but she has been noncompliant with wearing or compressions.. She was here last week with an outpatient return visit planned but the patient came in a very poor general condition with altered mental status and was rushed to the ER on my request. With a history of hypertension, diabetes, TIA and right-sided weakness she was set up for an MRI on her brain and cervical spine and was sent to [facility]. Getting an MRI done was very difficult but once the workup was done she was found not to have any spinal stenosis, epidural abscess or hematoma or discitis. This was radiculopathy to be treated as an outpatient and she was given a follow-up appointment. Today she is feeling much better alert and oriented and has  come to reevaluate her bilateral lower extremity  lymphedema and ulceration 03/25/2017 -- she was admitted to the hospital on 03/16/2017 and discharged on 03/18/2017 with left leg cellulitis and ulceration. She was started on vancomycin and Zosyn and x-ray showed no bony involvement. She was treated for a cellulitis with IV antibiotics changed to Rocephin and Flagyl and was discharged on oral Keflex and doxycycline to complete a 7 day course. Last hemoglobin A1c was 7.1 and her other ailments including hypertension got asthma were appropriately treated. 05/06/2017 -- she is awaiting the right size of compression stockings from [place] but other than that has been doing well. ====== Old notes 70 year old patient was seen one time last October and was lost to follow-up. She has recurrent problems with weeping and ulceration of her left lower extremity and has swelling of this for several years. It has been worse for the last 2 months. Past medical history is significant for diabetes mellitus type 2, hypertension, gout, morbid obesity, depressive disorders, hiatal hernia, migraines, status post knee surgery, risk of a cholecystectomy, vaginal hysterectomy and breast biopsy. She is not a smoker. As noted before she has never had a venous duplex study and an arterial ABI study was attempted but the left lower extremity was noncompressible 10/01/2016 -- had a lower extremity venous duplex reflux evaluation which showed no evidence of deep vein reflux in the right or left lower extremity, and no evidence of great saphenous vein reflux more than 500 ms in the right or left lower extremity, and the left small saphenous vein is incompetent but no vascular consult was recommended. review of her electronic medical records noted that the ABI was checked in July 2017 where the right ABI was normal limits and the left ABI could not be ascertained due to pain with cuff pressure but the waveforms are within normal  limits. her arterial duplex study scheduled for April 27. 10/08/2016 -- the patient has various reasons for not having a compression on and for the last 3 days she has had no compression on her left lower extremity either due to pain or the lack of nursing help. She does not use her juxta lites either. 10/15/2016 -- the patient did not keep her appointment for arterial duplex study on April 27 and I have asked her to reschedule this. Her pain is out of proportion with the physical findings and she continuously fails to wear a compression wraps and cuts them off because she says she cannot tolerate the pain. She does not use her juxta lites either. 10/22/2016 -- he has rescheduled her arterial duplex study to May 21 and her pain today is a bit better. She has not been wearing her juxta lites on her right lower extremity but now understands that she needs to do this. She did tolerate the to press compression wrap on her left lower extremity 10/29/2016 --arterial duplex study is scheduled for next week and overall she has been tolerating her compression wraps and also using her juxta lites on her right lower extremity 11/05/2016 -- the right ABI was 0.95 the left was 1.03. The digit TBI is on the right was 0.83 on the left was 0.92 and she had biphasic flow through these vessels. The impression was that of normal lower extremity arterial study. 11/12/2016 -- her pain is minimal and she is doing very well overall. 11/26/2016 -- she has got juxta lites and her insurance will not pay for additional dual layer compression stockings. She is going to order some from [facility]. 05/12/2017 -- her juxta  lites are very old and too big for her and these have not been helping with compression. She did get 20-30 mm compression stockings from [place] but she and her husband are unable to put these on. I believe she will benefit from bilateral Extremit-ease, compression stockings and we will measure her for these  today. 05/20/2017 -- lymphedema on the left lower extremity has increased a lot and she has a open ulceration as a result of this. The right lower extremity is looking pretty good. She has decided to by the compression stockings herself and will get reimbursed by the home health, at a later date. 05/27/2017 -- her sciatica is bothering her a lot and she thought her left leg pain was caused due to the compression wrap and hence removed it and has significant lymphedema. There is no inflammation on this left lower extremity. 06/17/17 on evaluation today patient appears to be doing very well and in fact is completely healed in regard to her ulcerations. Unfortunately however she does have continued issues with lymphedema nonetheless. We did order compression garments for her unfortunately she states that the size that she received were large although we ordered medium. Obviously this means she is not getting the optimal compression. She does not have those with her today and therefore we could not confirm and contact the company on her behalf. Nonetheless she does state that she is going to have her husband bring them by tomorrow so that we can verify and then get in touch with the company. No fevers, chills, nausea, or vomiting noted at this time. Overall patient is doing better otherwise and I'm pleased with the progress she has made. 07/01/17 on evaluation today patient appears to be doing very well in regard to her bilateral lower extremity she does not have any openings at this point which is excellent news. Overall I'm pleased with how things have progressed up to this time. Since she is doing so well we did order her compression which we are seeing her today to ensure that it fits her properly and everything is doing well in that regard and then subsequently she will be discharged. ============ Old Notes: 03/31/16 patient presents today for evaluation concerning open wounds that she has over the  left medial ankle region as well as the left dorsal foot. She has previously had this occur although it has been healed for a number of months after having this for about a year prior until her hospitalization on 01/05/16. At that point in time it appears that she was admitted to the hospital for left lower extremity cellulitis and was placed on vancomycin and Zosyn at that point. Eventually upon discharge on January 15, 2016 she was placed on doxycycline at that point in time. Later on 03/27/16 positive wound culture growing Escherichia coli this was switched to amoxicillin. Currently she tells me that she is having pain radiated to be a 7 out of 10 which can be as high as 10 out of 10 with palpation and manipulation of the wound. This wound appears to be mainly venous in nature due to the bilateral lower extremity venous stasis/lymphedema. This is definitely much worse on her left than the right side. She does have type 1 diabetes mellitus, hypertension, morbid obesity, and is wheelchair dependent.during the course of the hospital stay a blood culture was also obtained and fortunately appeared negative. She also had an x-ray of the tibia/fibula on the left which showed no acute bone abnormality. Her white blood  cell count which was performed last on 03/25/16 was 7.3, hemoglobin 12.8, protein 7.1, albumin 3.0. Her urine culture appeared to be negative for any specific organisms. Patient did have a left lower extremity venous duplex evaluation for DVT . This did not include venous reflux studies but fortunately was negative for DVT Patient also had arterial studies performed which revealed that she had a . normal ABI on the right though this was unable to be performed on the left secondary to pain that she was having around the ankle region due to the wound. However it was stated on report that she had biphasic pulses and apparently good blood flow. ========== 06/03/17 she is here in follow-up evaluation  for right lower extremity ulcer. The right lower sure he has healed but she has reopened to the left medial malleolus and dorsal foot with weeping. She is waiting for new compression garments to arrive from home health, the previous compression garments were ill fitting. We will continue with compression bilaterally and follow-up in 2 weeks Readmission: 08/05/17 on evaluation today patient appears to be doing somewhat poorly in regard to her left lower extremity especially although the right lower extremity has a small area which may no longer be open. She has been having a lot of drainage from the left lower extremity however he tells me that she has not been able to use the EXTREMIT-EASE Compression at this point. She states that she did better and was able to actually apply the Juxta-Lite compression although the wound that she has is too large and therefore really does not compress which is why she cannot wear it at this point. She has no one who can help her put it on regular basis her son can sometimes but he's not able to do it most of the time. I do believe that's why she has begun to weave and have issues as she is currently yet again. No fevers, chills, nausea, or vomiting noted at this time. Patient is no evidence of dementia. 08/12/17 on evaluation today patient appears to still be doing fairly well in regard to the draining areas/weeping areas at this point. With that being said she unfortunately did go to the ER yesterday due to what was felt to be possibly a cellulitis. They place her on doxycycline by mouth and discharge her home. She definitely was not admitted. With that being said she states she has had more discomfort which has been unusual for her even compared to prior times and she's had infections.08/12/17 on evaluation today patient appears to still be doing fairly well in regard to the draining areas/weeping areas at this point. With that being said she unfortunately did go to  the ER yesterday due to what was felt to be possibly a cellulitis. They place her on doxycycline by mouth and discharge her home. She definitely was not admitted. With that being said she states she has had more discomfort which has been unusual for her even compared to prior times and she's had infections. 08/19/17 put evaluation today patient tells me that she's been having a lot of what sounds to be neuropathic type pain in regard to her left lower extremity. She has been using over-the-counter topical bins again which some believe. That in order to apply the she actually remove the wrap we put on her last Wednesday on Thursday. Subsequently she has not had anything on compression wise since that time. The good news is a lot of the weeping areas appear to  have closed at this point again I believe she would do better with compression but we are struggling to get her to actually use what she needs to at this point. No fevers, chills, nausea, or vomiting noted at this time. 09/03/17 on evaluation today patient appears to be doing okay in regard to her lower extremities in regard to the lymphedema and weeping. Fortunately she does not seem to show any signs of infection at this point she does have a little bit of weeping occurring in the right medial malleolus area. With that being said this does not appear to be too significant which is good news. 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 She has severe skin damage secondary to both of these features involving the dorsal left foot and medial left ankle and lower leg. Still has open areas in the left anterior foot. The area on the right closed over. She uses her own juxta light stockings. She does not have an arterial issue 09/16/17 on evaluation today patient actually appears to be doing excellent in regard to her bilateral lower extremity swelling. The Juxta-Lite compression wrap seem  to be doing very well for her. She has not however been using the portion that goes over her foot. Her left foot still is draining a little bit not nearly as significant as it has been in the past but still I do believe that she likely needs to utilize the full wrap including the foot portion of this will improve as well. She also has been apparently putting on a significant amount of Vaseline which also think is not helpful for her. I recommended that if she feels she needs something for moisturizer Eucerin will probably be better. 09/30/17 on evaluation today patient presents with several new open areas in regard to her left lower extremity although these appear to be minimal and mainly seem to be more moisture breakdown than anything. Fortunately she does not seem to have any evidence of infection which is great news. She has been tolerating the dressing changes without complication we are using silver alginate on the foot she has been using AB pads to have the legs and using her Juxta- Lite compression which seems to be controlling her swelling very well. Overall I'm pleased with the poor way she has progressed. 10/14/17 on evaluation today patient appears to be doing better in regard to her left lower extremity areas of weeping. She does still have some discomfort although in general this does not appear to be as macerated and I think it is progressing nicely. I do think she still needs to wear the foot portion of her Juxta- Lite in order to get the most benefit from the wrap obviously. She states she understands. Fortunately there does not appear to be evidence of infection at this time which is great news. 10/28/17 on evaluation today patient appears to be doing excellent in regard to her left lower extremity. She has just a couple areas that are still open and seem to be causing any trouble whatsoever. For that reason I think that she is definitely headed in the right direction the spots are very  tiny compared to what we have been dealing with in the past. 11/11/17 on evaluation today patient appears to have a right lateral lower extremity ulcer that has opened since I last saw her. She states this is where the home health nurse that was coming out remove the dressing without wetting  the alginate first. Nonetheless I do not know if this is indeed the case or not but more importantly we have not ordered home help to be coming out for her wounds at all. I'm unsure as to why they are coming out and we're gonna have to check on this and get things situated in that regard. With that being said we currently really do not need them to be coming out as the patient has been taking care of her leg herself without complication and no issues. In fact she was doing much better prior to nursing coming out. 11/25/17 on evaluation today patient actually appears to be doing fairly well in regard to her left lower extremity swelling. In fact she has very little area of weeping at this point there's just a small spot on the lateral portion of her right leg that still has me just a little bit more concerned as far as wanting to see this clear up before I discharge her to caring for this at home. Nonetheless overall she has made excellent progress. 12/09/17 on evaluation today patient appears to be doing rather well in regard to her lower extremity edema. She does have some weeping still in the left lower extremity although the big area we were taking care of two weeks ago actually has closed and she has another area of weeping on the left lower extremity immediately as well is the top of her foot. She does not currently have lymphedema pumps she has been wearing her compression daily on a regular basis as directed. With that being said I think she may benefit from lymphedema pumps. 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 We had a very difficult time getting and keeping this under control. 12/23/17 on evaluation today patient actually appears to be doing a little bit more poorly in regard to her bilateral lower extremities. She has been tolerating the Juxta-Lite compression wraps. Unfortunately she has two new ulcers on the right lower extremity and left lower Trinity ulceration seems to be larger. Obviously this is not good news. She has been tolerating the dressings without complication. 12/30/17 on evaluation today patient actually appears to be doing much better in regard to her bilateral lower extremity edema. She continues to have some issues with ulcerations and in fact there appears to be one spot on each leg where the wrap may have caused a little bit of a blister which is subsequently opened up at this point is given her pain. Fortunately it does not appear to be any evidence of infection which is good news. No fevers chills noted. 01/13/18 on evaluation today patient appears to be doing rather well in regard to her bilateral lower extremities. The dressings did get kind of stuck as far as the wound beds are concerned but again I think this is mainly due to the fact that she actually seems to be showing signs of healing which is good news. She's not having as much drainage therefore she was having more of the dressing sticking. Nonetheless overall I feel like her swelling is dramatically down compared to previous. 01/20/18 on evaluation today patient unfortunately though she's doing better in most regards has a large blister on the left anterior lower extremity where she is draining quite significantly. Subsequently this is going to need debridement today in order to see  what's underneath and ensure she does not continue to trapping fluid at this location. Nonetheless No fevers, chills, nausea, or vomiting noted at this time. 01/27/18 on evaluation today  patient appears to be doing rather well at this point in regard to her right lower extremity there's just a very small area that she still has open at this point. With that being said I do believe that she is tolerating the compression wraps very well in making good progress. Home health is coming out at this point to see her. Her left lower extremity on the lateral portion is actually what still mainly open and causing her some discomfort for the most part 02/10/18 on evaluation today patient actually appears to be doing very well in regard to her right lower extremity were all the ulcers appear to be completely close. In regard to the left lower extremity she does have two areas still open and some leaking from the dorsal surface of her foot but this still seems to be doing much better to me in general. 02/24/18 on evaluation today patient actually appears to be doing much better in regard to her right lower extremity this is still completely healed. Her left lower extremity is also doing much better fortunately she has no evidence of infection. The one area that is gonna require some debridement is still on the left anterior shin. Fortunately this is not hurting her as badly today. 03/10/18 on evaluation today patient appears to be doing better in some regards although she has a little bit more open area on the dorsal foot and she also has some issues on the medial portion of the left lower extremity which is actually new and somewhat deep. With that being said there fortunately does not appear to be any significant signs of infection which is good news. No fevers, chills, nausea, or vomiting noted at this time. In general her swelling seems to be doing fairly well which is good news. 03/31/18 on evaluation today patient presents for follow-up concerning her left lower extremity lymphedema. Unfortunately she has been doing a little bit more poorly since I last saw her in regard to the amount of weeping  that she is experiencing. She's also having some increased pain in the anterior shin location. Unfortunately I do not feel like the patient is making such good progress at this point a few weeks back she was definitely doing much better. 04/07/18 on evaluation today patient actually appears to be showing some signs of improvement as far as the left lower extremity is concerned. She has been tolerating the dressing changes and it does appear that the Drawtex did better for her. With that being said unfortunately home health is stating that they cannot obtain the Drawtex going forward. Nonetheless we're gonna have to check and see what they may be able to get the alginate they were using was getting stuck in causing new areas of skin being pulled all that with and subsequently weep and calls her to worsen overall this is the first time we've seen improvement at this time. 04/14/18 on evaluation today patient actually appears to be doing rather well at this point there does not appear to be any evidence of infection at this time and she is actually doing excellent in regard to the weeping in fact she almost has no openings remaining even compared to just last week this is a dramatic improvement. No fevers chills noted 04/21/18 evaluation today patient actually appears to be doing very well.  She in fact is has a small area on the posterior lower extremity location and she has a small area on the dorsal surface of her foot that are still open both of which are very close to closing. We're hoping this will be close shortly. She brought her Juxta-Lite wrap with her today hoping that would be able to put her in it unfortunately I don't think were quite at that point yet but we're getting closer. 04/28/18 upon evaluation today patient actually appears to be doing excellent in regard to her left lower extremity ulcer. In fact the region on the posterior lower extremity actually is much smaller than previously  noted. Overall I'm very happy with the progress she has made. She again did bring her Juxta-Lite although we're not quite ready for that yet. 05/11/18 upon evaluation today patient actually appears to be doing in general fairly well in regard to her left lower Trinity. The swelling is very well controlled. With that being said she has a new area on the left anterior lower extremity as well as between the first and second toes of her left foot that was not present during the last evaluation. The region of her posterior left lower extremity actually appears to be almost completely healed. T be honest I'm very pleased with o the way that stands. Nonetheless I do believe that the lotion may be keeping the area to moist as far as her legs are concerned subsequently I'm gonna consider discontinuing that today. 05/26/18 on evaluation today patient appears to be doing rather well in regard to her left lower should be ulcers. In fact everything appears to be close except for a very small area on the left posterior lower extremity. Fortunately there does not appear to be any evidence of infection at this time. Overall very pleased with her progress. 06/02/18 and evaluation today patient actually appears to be doing very well in regard to her lower extremity ulcers. She has one small area that still continues to weep that I think may benefit her being able to justify lotion and user Juxta-Lite wraps versus continued to wrap her. Nonetheless I think this is something we can definitely look into at this point. 06/23/18 on evaluation today patient unfortunately has openings of her bilateral lower extremities. In general she seems to be doing much worse than when I last saw her just as far as her overall health standpoint is concerned. She states that her discomfort is mainly due to neuropathy she's not having any other issues otherwise. No fevers, chills, nausea, or vomiting noted at this time. 06/30/18 on  evaluation today patient actually appears to be doing a little worse in regard to her right lower extremity her left lower extremity of doing fairly well. Fortunately there is no sign of infection at this time. She has been tolerating the dressing changes without complication. Home health did not come out like they were supposed to for the appropriate wrap changes. They stated that they never received the orders from [place] which were fax. Nonetheless we will send a copy of the orders with the patient today as well. 07/07/18 on evaluation today patient appears to be doing much better in regard to lower extremities. She still has several openings bilaterally although since I last saw her her legs did show obvious signs of infection when she later saw her nurse. Subsequently a culture was obtained and she is been placed on Bactrim and Keflex. Fortunately things seem to be looking much better it  does appear she likely had an infection. Again last week we'd even discussed it but again there really was not any obvious sign that she had infection therefore we held off on the antibiotics. Nonetheless I'm glad she's doing better today. 07/14/18 on evaluation today patient appears to be doing much better regarding her bilateral lower Trinity's. In fact on the right lower for me there's nothing open at this point there are some dry skin areas at the sites where she had infection. Fortunately there is no evidence of systemic infection which is excellent news. No fevers chills noted 07/21/18 on evaluation today patient actually appears to be doing much better in regard to her left lower extremity ulcers. She is making good progress and overall I feel like she's improving each time I see her. She's having no pain I do feel like the infection is completely resolved which is excellent news. No fevers, chills, nausea, or vomiting noted at this time. 07/28/18 on evaluation today patient appears to be doing very well in regard  to her left lower [employer]. Everything seems to be showing signs of improvement which is excellent news. Overall very pleased with the progress that has been made. Fortunately there's no evidence of active infection at this time also excellent news. 08/04/18 on evaluation today patient appears to be doing more poorly in regard to her bilateral lower extremities. She has two new areas open up on the right and these were completely closed as of last week. She still has the two spots on the left which in my pinion seem to be doing better. Fortunately there's no evidence of infection again at this point. 08/11/18 on evaluation today patient actually appears to be doing very well in regard to her bilateral lower Trinity wounds that all seem to be doing better and are measures smaller today. Fortunately there's no signs of infection. No fevers, chills, nausea, or vomiting noted at this time. 08/18/18 on evaluation today patient actually appears to be doing about the same inverter bilateral lower extremities. She continues to have areas that blistering open as was drain that fortunately nothing too significant. Overall I feel like Drawtex may have done better for her however compared to the collagen. 08/25/18 on evaluation today patient appears to be doing a little bit more poorly today even compared to last time I saw her. Again I'm not exactly sure why she's making worse progress over the past several weeks. I'm beginning to wonder if there is some kind of underlying low level infection causing this issue. I did actually take a culture from the left anterior lower extremity but it was a new wound draining quite a bit at this point. Unfortunately she also seems to be having more pain which is what also makes me worried about the possibility of infection. This is despite never erythema noted at this point. 09/01/18 on evaluation today patient actually appears to be doing a little worse even compared to last  week in regard to bilateral lower extremities. She did go to the hospital on the 16th was given a dose of IV Zosyn and then discharged with a recommendation to continue with the Bactrim that I previously prescribed for her. Nonetheless she is still having a lot of discomfort she tells me as well at this time. This is definitely unfortunate. No fevers, chills, nausea, or vomiting noted at this time. 09/08/18 on evaluation today patient's bilateral lower extremities actually appear to be shown signs of improvement which is good news.  Fortunately there does not appear to be any signs of active infection I think the anabiotic is helping in this regard. Overall I'm very pleased with how she is progressing. 09/15/18 patient was actually seen in ER yesterday due to her legs as well unfortunately. She states she's been having a lot of pain and discomfort as well as a lot of drainage. Upon inspection today the patient does have a lot of swelling and drainage I feel like this is more related to lymphedema and poor fluid control than it is to infection based on what I'm seeing. The physician in the emergency department also doubted that the patient was having a significant infection nonetheless I see no evidence of infection obvious at this point although I do see evidence of poor fluid control. She still not using a compression pumps, she is not elevating due to her lift chair as well as her hospital bed being broken, and she really is not keeping her legs up as much as they should be and also has been taking off her wraps. All this combined I think has led to poor fluid control and to be honest she may be somewhat volume overloaded in general as well. I recommend that she may need to contact your physician to see if a prescription for a diuretic would be beneficial in their opinion. As long as this is safe I think it would likely help her. 09/29/18 on evaluation today patient's left lower extremity actually appears  to be doing quite a bit better. At least compared to last time that I saw her. She still has a large area where she is draining from but there's a lot of new skin speckled trout and in fact there's more new skin that there are open areas of weeping and drainage at this point. This is good news. With regard to the right lower extremity this is doing much better with the only open area that I really see being a dry spot on the right lateral ankle currently. Fortunately there's no signs of active infection at this time which is good news. No fevers, chills, nausea, or vomiting noted at this time. The patient seems somewhat stressed and overwhelmed during the visit today she was very lethargic as such. She does and she is not taking any pain medications at this point. Apparently according to her husband are also in the process of moving which is probably taking its toll on her as well. 10/06/18 on evaluation today patient appears to be doing rather well in regard to her lower extremities compared to last evaluation. Fortunately there's no signs of active infection. She tells me she did have an appointment with her primary. Nonetheless he was concerned that the wounds were somewhat deep based on pictures but we never actually saw her legs. She states that he had her somewhat worried due to the fact that she was fearing now that she was [place] have to have an amputation. With that being said based on what I'm seeing check she looks better this week that she has the last two times I've seen her with much less drainage I'm actually pleased in this regard. That doesn't mean that she's out of the water but again I do not think what the point of talking about education at all in regard to her leg. She is very happy to hear this. She is also not having as much pain as she was having last week. 10/13/18 unfortunately on evaluation today patient still continues  to have a significant amount of drainage she's not letting  home health actually apply the compression dressings at this point. She's trying to use of Juxta-Lite of the top of Kerlex and the second layer of the three layer compression wrap. With that being said she just does not seem to be making as good a progress as I would expect if she was having the compression applied and in place on a regular basis. No fevers, chills, nausea, or vomiting noted at this time. 10/20/18 on evaluation today patient appears to be doing a little better in regard to her bilateral lower extremity ulcers. In fact the right lower extremity seems to be healed she doesn't even have any openings at this point left lower extremity though still somewhat macerated seems to be showing signs of new skin growth at multiple locations throughout. Fortunately there's no evidence of active infection at this time. No fevers, chills, nausea, or vomiting noted at this time. 10/27/18 on evaluation today patient appears to be doing much better in regard to her left lower Trinity ulcer. She's been tolerating the laptop complication and has minimal drainage noted at this point. Fortunately there's no signs of active infection at this time. No fevers, chills, nausea, or vomiting noted at this time. 11/03/18 on evaluation today patient actually appears to be doing excellent in regard to her left lower extremity. She is having very little drainage at this point there does not appear to be any significant signs of infection overall very pleased with how things have gone. She is likewise extremely pleased still and seems to be making wonderful progress week to week. I do believe antibiotics were helpful for her. Her primary care provider did place on amateur clean since I last saw her. 11/17/18 on evaluation today patient appears to be doing worse in regard to her bilateral lower extremities at this point. She is been tolerating the dressing changes without complication. With that being said she typically takes  the Coban off fairly quickly upon arriving home even after being seen here in the clinic and does not allow home health reapply command as part of the dressing at home. Therefore she said no compression essentially since I last saw her as best I can tell. With that being said I think it shows and how much swelling she has in the open wounds that are noted at this point. Fortunately there's no signs of infection but unfortunately if she doesn't get this under control I think she will end up with infection and more significant issues. 11/24/18 on evaluation today patient actually appears to be doing somewhat better in regard to her bilateral lower extremities. 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 Nonetheless that is doing better currently so she should be able to attempt reinitiating the lymphedema pumps at this time. No fevers, chills, nausea, or vomiting noted at this time. 12/01/18 upon evaluation today patient's left lower extremity appears to be doing quite well unfortunately her right lower extremity is not doing nearly as well. She has been tolerating the dressing changes without complication unfortunately she did not keep a wrap on the right at this time. Nonetheless I believe this has led to increased swelling and weeping in the world is actually much larger than during the last evaluation with her. 12/08/18 on evaluation  today patient appears to be doing about the same at this point in regard to her right lower extremity. There is some more palatable to touch I'm concerned about the possibility of there being some infection although I think the main issue is she's not keeping her compression wrap on which in turn is not allowing this area to heal appropriately. 12/22/18 on evaluation today patient appears to be doing better in regard to left lower  extremity unfortunately significantly worse in regard to the right lower extremity. The areas of blistering and necrotic superficial tissue have spread and again this does not really appear to be signs of infection and all she just doesn't seem to be doing nearly as well is what she has been in the past. Overall I feel like the Augmentin did absolutely nothing for her she doesn't seem to have any infection again I really didn't think so last time either is more of a potential preventative measure and hoping that this would make some difference but I think the main issue is she's not wearing her compression. She tells me she cannot wear the [place] been we put on she takes it off pretty much upon getting home. Subsequently she worshiped Juxta-Lite when I questioned her about how often she wears it this is no more than three hours a day obviously that leaves 21 hours that she has no compression and this is obviously not doing well for her. Overall I'm concerned that if things continue to worsen she is at great risk of both infection as well as losing her leg. 01/05/19 on evaluation today patient appears to be doing well in regard to her left lower extremity which he is allowing [place] to wrap and not so well with regard to her right lower extremity which she is not allowing [place] to really wrap and keep the wrap on. She states that it hurts too badly whenever it's wrapped and she ends up having to take it off. She's been using the Juxta-Lite she tells me up to six hours a day although I question whether or not that's really been the case to be honest. Previously she told me three hours today nonetheless obviously the legs as long as the wrap is doing great when she is not is doing much more poorly. 01/12/2019 on evaluation today patient actually appears to be doing a little better in my opinion with regard to her right lower extremity ulcer. She has a small open area on the left lower extremity unfortunately but  again this I think is part of the normal fluctuation of what she is going to have to expect with regard to her legs especially when she is not using her lymphedema pumps on a regular basis. Subsequently based on what I am seeing today I think that she does seem to be doing slightly better with regard to her right lower extremity she did see her primary care provider on Monday they felt she had an infection and placed her on 2 antibiotics. Both Cipro and clindamycin. Subsequently again she seems possibly to be doing a little bit better in regards to the right lower extremity she also tells me however she has been wearing the compression wrap over the past week since I spoke with her as well that is a Kerlix and Coban wrap on the right. No fevers, chills, nausea, vomiting, or diarrhea. 01/19/2019 on evaluation today patient appears to be doing better with regard to her bilateral lower extremities especially the right. I  feel like the compression has been beneficial for her which is great news. She did get a call from her primary care provider on her way here today telling her that she did have methicillin- resistant Staphylococcus aureus and he was calling in a couple new antibiotics for her including a ointment to be applied she tells me 3 times a day. With that being said this sounds like likely to be Bactroban which I think could be applied with each dressing/wrap change but I would not be able to accommodate her applying this 3 times a day. She is in agreement with the least doing this we will add that to her orders today. 01/26/2019 on evaluation today patient actually appears to be doing much better with regard to her right lower extremity. Her left lower extremity is also doing quite well all things considering. Fortunately there is no evidence of active infection at this time. No fevers, chills, nausea, vomiting, or diarrhea. 02/02/2019 on evaluation today patient appears to be doing much better  compared to her last evaluation. Little by little off like her right leg is returning more towards normal. There does not appear to be any signs of active infection and overall she seems to be doing quite well which is great news. I am very pleased in this regard. No fevers, chills, nausea, vomiting, or diarrhea. 02/09/2019 upon evaluation today patient appears to be doing better with regard to her bilateral lower extremities. She has been tolerating the dressing changes without complication. Fortunately there is no signs of active infection at this time. No fevers, chills, nausea, vomiting, or diarrhea. 02/23/2019 on evaluation today patient actually appears to be doing quite well with regard to her bilateral lower extremities. She has been tolerating the dressing changes without complication. She is even used her pumps one time and states that she really felt like it felt good. With that being said she seems to be in good spirits and her legs appear to be doing excellent. 03/09/2019 on evaluation today patient appears to be doing well with regard to her right lower extremity there are no open wounds at this time she is having some discomfort but I feel like this is more neuropathy than anything. With regard to her left lower extremity she had several areas scattered around that she does have some weeping and drainage from but again overall she does not appear to be having any significant issues and no evidence of infection at this time which is good news. 03/23/2019 on evaluation today patient appears to be doing well with regard to her right lower extremity which she tells me is still close she is using her juxta light here. Her left lower extremity she mainly just has an area on the foot which is still slightly draining although this also is doing great. Overall very pleased at this time. 04/06/2019 patient appears to be doing a little bit worse in regard to her left lower extremity upon evaluation  today. She feels like this could be becoming infected again which she had issues with previous. Fortunately there is no signs of systemic infection but again this is always a struggle with her with her legs she will go from doing well to not so well in a very short amount of time. 04/20/2019 on evaluation today patient actually appears to be doing quite well with regard to her right lower extremity I do not see any signs of active infection at this time. Fortunately there is no fever chills noted.  She is still taking the antibiotics which I prescribed for her at this point. In regard to the left lower extremity I do feel like some of these areas are better although again she still is having weeping from several locations at this time. 04/27/2019 on evaluation today patient appears to be doing about the same if not slightly worse in regard to her left lower extremity ulcers. She tells me when questioned that she has been sleeping in her Hoveround chair in fact she tells me she falls asleep without even knowing it. I think she is spending a whole lot of time in the chair and less time walking and moving around which is not good for her legs either. On top of that she is in a seated position which is also the worst position she is not really elevating her legs and she is also not using her lymphedema pumps. All this is good to contribute to worsening of her condition in general. 05/18/2019 on evaluation today patient appears to be doing well with regard to her lower extremity on the right in fact this is showing no signs of any open wounds at this time. On the left she is continuing to have issues with areas that do drain. Some of the regions have healed and there are couple areas that have reopened. She did go to the ER per the patient according to recommendations from the home health nurse due to what she was seen when she came out on 05/13/2019. Subsequently she felt like the patient needed to go to the  hospital due to the fact that again she was having "milky white discharge" from her leg. Nonetheless she had and then was placed on doxycycline and subsequently seems to be doing better. 06/01/2019 upon evaluation today patient appears to be doing really in my opinion about the same. I do not see any signs of active infection which is good news. Overall she still has wounds over the bilateral lower extremities she has reopened on the right but this appears to be more of a crack where there is weeping/edema coming from the region. I do not see any evidence of infection at either site based on what I visualized today. 07/13/2019 upon evaluation today patient appears to be doing a little worse compared to last time I saw her. She since has been in the hospital from 06/21/2019 through 06/29/2019. This was secondary to having Covid. During that time they did apply lotion to her legs which unfortunately has caused her to develop a myriad of open wounds on her lower extremities. Her legs do appear to be doing better as far as the overall appearance is concerned but nonetheless she does have more open and weeping areas. 07/27/2019 upon evaluation today patient appears to be doing more poorly to be honest in regard to her left lower extremity in particular. There is no signs of systemic infection although I do believe she may have local infection. She notes she has been having a lot of blue/green drainage which is consistent potentially with Pseudomonas. That may be something that we need to consider here as well. The doxycycline does not seem to have been helping. 08/03/2019 upon evaluation today patient appears to be doing a little better in my opinion compared to last week's evaluation. Her culture I did review today and she is on appropriate medications to help treat the Enterobacter that was noted. Overall I feel like that is good news. With that being said she is unfortunately  continuing to have a lot of  drainage and though it is doing better I still think she has a long ways to go to get things dried up in general. Fortunately there is no signs of systemic infection. 08/10/2019 upon evaluation today patient appears to be doing may be slightly better in regard to her left lower extremity the right lower extremity is doing much better. Fortunately there is no signs of infection right now which is good news. No fevers, chills, nausea, vomiting, or diarrhea. 08/24/2019 on evaluation today patient appears to be doing slightly better in regard to her lower extremities. The left lower extremity seems to be healed the right lower extremity is doing better though not completely healed as far as the openings are concerned. She has some generalized issues here with edema and weeping secondary to her lymphedema though again I do believe this is little bit drier compared to prior weeks evaluations. In general I am very pleased with how things seem to be progressing. No fevers, chills, nausea, vomiting, or diarrhea. 08/31/2019 upon evaluation today patient actually seems to making some progress here with regard to the left lower extremity in particular. She has been tolerating the dressing changes without complication. Fortunately there is no signs of active infection at this time. No fevers, chills, nausea, vomiting, or diarrhea. She did see Dr. Doren Custard and he did note that she did have a issue with the left great saphenous vein and the small saphenous vein in the leg. With that being said he was concerned about the possibility of laser ablation not being extremely successful. He also mentioned a small risk of DVT associated with the procedure. However if the wounds do not continue to improve he stated that that would probably be the way to go. Fortunately the patient's legs do seem to be doing much better. 09/07/2019 upon evaluation today patient appears to be doing better with regard to her lower extremities. She has  been tolerating the dressing changes without complication. With that being said she is showing signs of improvement and overall very pleased. There are some areas on her leg that I think we do need to debride we discussed this last week the patient is in agreement with doing that as long as it does not hurt too badly. 09/14/2019 upon evaluation today patient appears to be doing decently well with regard to her left lower extremity. She is not having near as much weeping as she has had in the past things seem to be drying up which is good news. There is no signs of active infection at this time. 09/21/19 upon evaluation today patient appears to be doing better in regard overall to her bilateral lower extremities. She again has less open than she did previous and each week I feel like this is getting better. Fortunately there is no signs of active infection at this time. No fevers, chills, nausea, vomiting, or diarrhea. 09/28/2019 upon evaluation today patient actually appears to be showing signs of improvement with regard to her left lower extremity. Unfortunately the right medial lower extremity around the ankle region has reopened to some degree but this appears to be minimal still which is good news. There is no signs of active infection at this time which is also good news. 10/12/2019 upon evaluation today patient appears to be doing okay with regard to her bilateral lower extremities today. The right is a little bit worse then last evaluation 2 weeks ago. The left is actually doing a little  better in my opinion. Overall there is no signs of active infection at this time that I see. Obviously that something we have to keep a close eye on she is very prone to this with the significant and multiple openings that she has over the bilateral lower extremities. 10/19/2019 upon evaluation today patient appears to be doing about the best that I have seen her in quite some time. She has been tolerating the  dressing changes without complication. There does not appear to be any signs of active infection and overall I am extremely happy with the way her legs appeared. She is drying up quite nicely and overall is having less pain. 11/09/2019 upon evaluation today patient appears to be doing better in regard to her wounds. She seems to be drying up more and more each time I see her this is just taking a very long time. Fortunately there is no signs of active infection at this time. 11/23/2019 upon evaluation today patient actually appears to be doing excellent in regard to her lower extremities at this point compared to where she has been. Fortunately there is no signs of active infection at this time. She did go to the hospital last week for nausea and vomiting completely unrelated to her wounds. Fortunately she is doing better she was given some Reglan and got better. She had associated abdominal pain but they never found out what was going on. 12/07/2019 upon evaluation today patient appears to be doing well for the most part in regard to her legs. She unfortunately has not been keeping the [place] portion of her wraps on therefore the compression has not really been sufficient for what it is supposed to be. Nonetheless she tells me that it just hurt too bad therefore she removed it. 12/21/2019 upon evaluation today patient actually appears to be doing quite well with regard to her legs. I do feel like she has been making progress which is great news and overall there is no signs of active infection at this time. No fevers, chills, nausea, vomiting, or diarrhea. 01/04/2020 upon evaluation today patient presents for follow-up concerning her lower extremity edema bilaterally. She still has open wounds she has not been using her lymphedema pumps. She is also not been utilizing her compression wraps appropriately she tends to unwrap them, take them off, or states that they hurt. Obviously the reason they hurt is  because her legs start to swell but the issue is if she would use her compression/lymphedema pumps regularly she would not swell and she would have the pain. Nonetheless she has not even picked them up once honestly over the past several months and may be even as much as in the past year based on my opinion and what have seen. She tells me today that after last week when I talked about this with her specifically actually that was 2 weeks ago that she "forgot". 8//21 on evaluation today patient appears to be doing a little better in regard to her legs bilaterally. Fortunately there is no signs of active infection at this time. She tells me that she used her lymphedema pumps all of one time over the past 2 weeks since I last saw her. She tells me that she has been too busy in order to continue to use these. 02/01/2020 on evaluation today patient appears to be doing some better in regard to her wounds in general in her legs. We felt the right was healed although is not completely it does appear  to be doing better she tells me she has been using her lymphedema pumps that she has had this six times since I last saw her. Obviously the more she does that the better she would do my opinion 02/15/2020 upon evaluation today patient appears to be doing about the same in regard to her legs. She tells me that she is pumping I'm still not sure how much she does to be perfectly honest. However even if she does a little bit here and there I guess that is better than nothing. Fortunately there is no sign of active infection at this time which is great news. No fevers, chills, nausea, vomiting, or diarrhea. 02/29/2020 on evaluation today patient actually appears to be doing quite well all things considered this week. She has been tolerating the dressing changes without complication. Fortunately there is no signs of active infection at this time. No fevers, chills, nausea, vomiting, or diarrhea. 03/14/2020 upon evaluation  today patient appears to be doing really about the same in regard to her legs. There is no signs of improvement overall and she as noted from home health does not appear to be elevating her legs he can get into her lift chair. There is too much stuff piled up on it the patient tells me. She also tells me she cannot really use her pumps effectively due to the fact that she cannot have any space to get them on. Finally she is also not really elevating her legs because she is not sleeping in her bed she is sleeping in her chair currently and again overall I think everything that she is done in combination has been exactly the wrong thing for what she needs for her legs. 04/04/2020 upon evaluation today patient appears to be doing well at this time with regard to her legs. She is actually been pumping, keeping her wraps on, and to be honest she seems to be doing dramatically better the right leg is excellent the left leg is also excellent and measuring much smaller than previous. 04/18/2020 upon evaluation today patient actually is continue to make good progress in regard to her lower extremities bilaterally. Everything is improving and less wet that has been in the past overall I am extremely pleased with where things stand and I think that she is making great progress. The patient tells me she still continue to use her compression pumps 05/02/2020 on evaluation today patient appears to be doing well at this time in regard to her left leg which is showing signs of drying up. With that being said she does have a lot of lymphedema type crusty skin around the toes of her left foot and the right medial ankle which has opened at this point. Fortunately there is no signs of active infection systemically at this point or even locally for that matter. 05/23/2020 on evaluation today patient appears to be doing well with regard to her lower extremities. Fortunately there is no signs of active infection at this  time. No fever chills noted. She has been very depressed however she tells me. 06/06/2020 patient came in today for evaluation in regard to her bilateral lower extremity ulcerations. With that being said she came in feeling okay and actually laughing and joking around with the staff checking her in. Subsequently however she had a coughing spell and following the coughing spell it was a dramatic conversion from being jovial and joking around to being extremely short of breath her vital signs actually dropped in regard to  her blood pressure from around 175 to down around 791 for systolic and from around 96 diastolic down to around 70. With that being said she also accompanied this with an increase in her respiratory rate which was also quite significant. Nonetheless I actually upon going into see her was extremely worried we called EMS to have her transported to the ER for further evaluation and treatment. She continued until EMS got here to be extremely short of breath even on 6 L of oxygen. Her oxygen saturation did come up to 99% but overall it was only 95 before which was not terrible she did feel like the oxygen helped her feel somewhat better however. She has a history of asthma but again even the coughing spell really should not have done this degree of alteration in her demeanor from where she was just before to after the coughing spell. She tells [place] however she has been feeling somewhat abnormal since Friday although she could not really pinpoint exactly what was going on. 06/27/2020 plan evaluation today patient appears to be doing okay in regard to her leg ulcers. She is really not showing a lot of improvement to be honest and she still has a lot of weeping. With that being said after I last saw her we called EMS to take her to the hospital she actually did not go she went to see her primary care provider who according to the patient have not identified and read through the note states that  everything checked out okay. Nonetheless the patient has continued to have bouts where she gets very short of breath for seemingly no reason whatsoever. That happened even the same night that I saw her last time. This was after she refused transport to the hospital. With that being said she also tells me that just getting dressed to come here is quite a chore which is putting on her shirt causing her to have to stop to catch her breath. Obviously this is not normal and I feel like there is something going on. She may need a referral to be seen by cardiology ASAP. She does see her primary care provider early next week she tells me 07/11/2020 upon evaluation today patient's wounds again appear to be doing about the same she mainly has weeping of the bilateral lower extremities both are very swollen today she tells me she has been using her pumps but last time I saw her she told me she did not even have her pumps out that she had "packed them away because they had too much stuff in their house. Apparently her husband has been bringing stuff in from storage units. She then subsequently told me that she had so much stuff in her house that she has been staying up all night and did not go to bed till 7:00 this morning because she was cleaning up stuff. Nonetheless I am still unsure as to whether or not she is using the pumps based on what I am seeing I would think probably not. Nonetheless she has been keeping the compression wraps in place that is at least something 07/25/2020 upon evaluation today patient appears to be doing well currently in regard to her legs all things considered. I do not think she is doing any worse significantly although honestly I do not think she is doing a lot better either. There does not appear to be any signs of infection which is good news although she does have a lot of weeping. She is  using her pumps she tells me sporadically though she says that her husband is not a big fan of  helping her to get the [place]. She also tells me is difficult because in their house. It would be easiest for her to use these as where her son is actually staying with her and living out at this point. Obviously I understand that she is having a lot of issues here and to be honest I do not really know what to say in that regard and the fact that I really cannot change the situations but I do believe she really needs to be using the lymphedema pumps to try to keep things under control here. Outside of that I think she is going to continue to have significant issues. I think the pumps coupled with good compression and elevation are the only things that are to help her at this point. 08/08/2020 upon evaluation today patient appears to be doing a little worse in regard to her legs in general. I think this may be due to some infection currently. With that being said I am going to go ahead and likely see about putting her on an antibiotic. Were also can obtain a culture today where she had some purulent drainage. 08/29/2020 upon evaluation today patient appears to be doing about the same in regard to her bilateral lower extremities. Unfortunately she is continuing to have significant issues here with edema and she has significant lymphedema. With that being said she is really not doing anything that she is supposed to be doing as far as elevation, compression, using lymphedema pumps or anything really for that matter. She does not use her pumps, is taken the wraps off shortly after having them put on as far as home health is concerned at least by the next day she tells me, and overall is not really elevating her legs she is telling me she has a lot to do as far as cleaning up in her house. Nonetheless this triad of noncompliance is leading to worsening not improving in regard to her legs. I had a very strong conversation with her about this today again. 09/12/2020 on evaluation today patient appears to be  doing poorly with regard to her lower extremities bilaterally. Fortunately there does not appear to be any signs of active infection at this point. That is systemically. Locally there is some signs of pus coming from an area on her left leg. She has been on antibiotics recently though she is done with those currently. Nonetheless she tells me she has been having increased pain with the right leg this is extremely swollen as well. Fortunately there does not appear to be significant infection of the right leg though I think her pain is really as result of the increased swelling she has been declining the wrap on the right leg and the wounds here are getting significantly worse week by week. This obviously is not will be want to see. I discussed with the patient however if she does not use the wraps, use her pumps, and elevate her legs that she is not to get better. She has been sleeping in her motorized Hoveround which does not even allow her to elevate her legs at all. She tells me that she is also constantly "cleaning up as her husband told her that he wants to move back to [place]." Unfortunately this has been the narrative for the past several months in fact that she tells me that she has been  having to clean up mass that was moved in from storage units into their home and that she does not really sleep and she never really elevates her legs. I explained to the patient today that if she is not can I do any of this that there is really nothing that I can do to help her. 09/26/20 upon evaluation today patient appears to be doing about the same in regard to her wounds. Fortunately there is no signs of active infection at this time. No fever chills noted. She has been tolerating the dressing changes without complication which is good news. With that being said I do think that she unfortunately is still having a lot of pain but I think this is due to the swelling that really is not controlled and as I  discussed with her last time she needs to be using her lymphedema pumps there is still in the box she has not even attempted to use those whatsoever. I am also not even certain she is taking her fluid pills as she states she may need to have "get a refill" she was seen with her husband at the appointment today. 10/24/2020 upon evaluation today patient appears to be doing decently well in regard to her legs all things considered. She has been tolerating the dressing changes without complication. Fortunately there is no signs of active infection at this time. No fevers, chills, nausea, vomiting, or diarrhea. With that being said the patient does appear to have some excessive thickened skin buildup currently that has been require some sharp debridement to clear this away. I am hopeful if we do this we will be able to get some of this area to dry out this otherwise trapping fluid underneath. Her son is present during the office visit today. She tells me she still been doing a lot as far as cleaning up and going through boxes she tells me she has been up for a long time already this morning. Objective Constitutional Well-nourished and well-hydrated in no acute distress. Vitals Time Taken: 11:33 AM, Height: 62 in, Weight: 335 lbs, BMI: 61.3, Temperature: 98.3 F, Pulse: 88 bpm, Respiratory Rate: 20 breaths/min, Blood Pressure: 147/91 mmHg, Capillary Blood Glucose: 160 mg/dl. General Notes: glucose per pt report Respiratory normal breathing without difficulty. Psychiatric this patient is able to make decisions and demonstrates good insight into disease process. Alert and Oriented x 3. pleasant and cooperative. General Notes: Upon inspection patient's wounds again are not really wounds per se as much as it is just a generalized weeping secondary to lymphedema. Unfortunately this does not seem to be doing as well as I would like to see. She is also not really using the pumps as readily as she should be.  She has kept the wraps on her legs have them on today when she came in which is at least an improvement. We did discuss using the pumps as well as keeping her legs elevated today she also needs to not be staying up all night to clean out boxes. Integumentary (Hair, Skin) Wound #61 status is Open. Original cause of wound was Gradually Appeared. The date acquired was: 04/20/2019. The wound has been in treatment 79 weeks. The wound is located on the Left,Circumferential Lower Leg. The wound measures 14cm length x 32cm width x 0.1cm depth; 351.858cm^2 area and 35.186cm^3 volume. There is Fat Layer (Subcutaneous Tissue) exposed. There is no tunneling or undermining noted. There is a medium amount of serous drainage noted. The wound margin is indistinct  and nonvisible. There is large (67-100%) pink, pale granulation within the wound bed. There is no necrotic tissue within the wound bed. Wound #64 status is Open. Original cause of wound was Gradually Appeared. The date acquired was: 06/01/2019. The wound has been in treatment 73 weeks. The wound is located on the Left,Dorsal Foot. The wound measures 6cm length x 7cm width x 0.1cm depth; 32.987cm^2 area and 3.299cm^3 volume. There is Fat Layer (Subcutaneous Tissue) exposed. There is no tunneling or undermining noted. There is a medium amount of serous drainage noted. The wound margin is indistinct and nonvisible. There is large (67-100%) pink, pale granulation within the wound bed. There is no necrotic tissue within the wound bed. Wound #67 status is Open. Original cause of wound was Gradually Appeared. The date acquired was: 05/02/2020. The wound has been in treatment 25 weeks. The wound is located on the Right,Medial Lower Leg. The wound measures 4cm length x 6cm width x 0.1cm depth; 18.85cm^2 area and 1.885cm^3 volume. There is Fat Layer (Subcutaneous Tissue) exposed. There is no tunneling or undermining noted. There is a medium amount of serosanguineous  drainage noted. The wound margin is indistinct and nonvisible. There is large (67-100%) pink, pale granulation within the wound bed. There is no necrotic tissue within the wound bed. Wound #69 status is Open. Original cause of wound was Gradually Appeared. The date acquired was: 07/25/2020. The wound has been in treatment 13 weeks. The wound is located on the Right,Dorsal Foot. The wound measures 1.4cm length x 0.5cm width x 0.2cm depth; 0.55cm^2 area and 0.11cm^3 volume. There is Fat Layer (Subcutaneous Tissue) exposed. There is no tunneling or undermining noted. There is a medium amount of serosanguineous drainage noted. The wound margin is flat and intact. There is large (67-100%) pink granulation within the wound bed. There is a small (1-33%) amount of necrotic tissue within the wound bed including Adherent Slough. Wound #70 status is Open. Original cause of wound was Gradually Appeared. The date acquired was: 08/29/2020. The wound has been in treatment 8 weeks. The wound is located on the Right,Anterior Lower Leg. The wound measures 3cm length x 1cm width x 0.1cm depth; 2.356cm^2 area and 0.236cm^3 volume. There is Fat Layer (Subcutaneous Tissue) exposed. There is no tunneling or undermining noted. There is a medium amount of serous drainage noted. The wound margin is flat and intact. There is large (67-100%) pink granulation within the wound bed. There is no necrotic tissue within the wound bed. 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 There was a Selective/Open Wound Skin/Epidermis Debridement with a total area of 150 sq cm performed by [name], PA. With the following instrument(s): Curette Material removed includes Skin: Epidermis and Fibrin/Exudate and. No specimens were taken. A time out was conducted at 12:00, prior to the start  of the procedure. There was no bleeding. The procedure was tolerated well with a pain level of 0 throughout and a pain level of 0 following the procedure. Post Debridement Measurements: 14cm length x 32cm width x 0.1cm depth; 35.186cm^3 volume. Character of Wound/Ulcer Post Debridement requires further debridement. Severity of Tissue Post Debridement is: Fat layer exposed. Post procedure Diagnosis Wound #61: Same as Pre-Procedure Pre-procedure diagnosis of Wound #61 is a Venous Leg Ulcer located on the Left,Circumferential Lower Leg . There was a Three Layer Compression Therapy Procedure by Rhae Hammock, RN. Post procedure Diagnosis Wound #61: Same as Pre-Procedure Wound #67 Pre-procedure diagnosis of Wound #67 is a Diabetic Wound/Ulcer of the Lower Extremity located on the Right,Medial Lower Leg .Severity of Tissue Pre Debridement is: Fat layer exposed. There was a Selective/Open Wound Skin/Epidermis Debridement with a total area of 36 sq cm performed by [name], PA. With the following instrument(s): Curette to remove Non-Viable tissue/material. Material removed includes Skin: Epidermis and Fibrin/Exudate and. No specimens were taken. A time out was conducted at 12:00, prior to the start of the procedure. There was no bleeding. The  procedure was tolerated well with a pain level of 0 throughout and a pain level of 0 following the procedure. Post Debridement Measurements: 4cm length x 6cm width x 0.1cm depth; 1.885cm^3 volume. Character of Wound/Ulcer Post Debridement requires further debridement. Severity of Tissue Post Debridement is: Fat layer exposed. Post procedure Diagnosis Wound #67: Same as Pre-Procedure Pre-procedure diagnosis of Wound #67 is a Diabetic Wound/Ulcer of the Lower Extremity located on the Right,Medial Lower Leg . There was a Three Layer Compression Therapy Procedure by Rhae Hammock, RN. Post procedure Diagnosis Wound #67: Same as Pre-Procedure Plan Follow-up Appointments: Return Appointment in 2 weeks. Bathing/ Shower/ Hygiene: May shower and wash wound with soap and water. - with dressing changes, wash both legs with wash cloth and soap and water with dressing changes Other Bathing/Shower/Hygiene Orders/Instructions: - Home health to wash legs with warn soapy water and a clean wash cloth with dressing changes Edema Control - Lymphedema / SCD / Other: Lymphedema Pumps. Use Lymphedema pumps on leg(s) 2-3 times a day for 45-60 minutes. If wearing any wraps or hose, do not remove them. Continue exercising as instructed. Elevate legs to the level of the heart or above for 30 minutes daily and/or when sitting, a frequency of: - especially at night Avoid standing for long periods of time. Exercise regularly Home Health: New wound care orders this week; continue Home Health for wound care. May utilize formulary equivalent dressing for wound treatment orders unless otherwise specified. - change to calcium alginate, hold zinc oxide paste Dressing changes to be completed by [place] on Monday / Wednesday / Friday except when patient has scheduled visit at [facility]. Other Home Health Orders/Instructions: - [facility] ordered were: Anerobic culture - right lower leg WOUND #61: -  Lower Leg Wound Laterality: Left, Circumferential Peri-Wound Care: Sween Lotion (Moisturizing lotion) (Home Health) 3 x Per Day/30 Days Discharge Instructions: Apply moisturizing lotion to dry skin on legs Prim Dressing: Maxorb Extra Calcium Alginate 2x2 in (Home Health) 3 x Per Day/30 Days ary Discharge Instructions: Apply calcium alginate to wound bed as instructed Secondary Dressing: ABD Pad, 8x10 (Home Health) 3 x Per Day/30 Days Discharge Instructions: Apply over primary dressing as directed. Secondary Dressing: Zetuvit Plus 4x4 in (Home Health) 3 x Per Day/30 Days Discharge Instructions: or equivalent extra absorbent pad.Apply over primary dressing as directed. Com pression  Wrap: ThreePress (3 layer compression wrap) (Home Health) 3 x Per Day/30 Days Discharge Instructions: Apply three layer compression as directed. Pad bend of ankle with foam or ABD pad. WOUND #64: - Foot Wound Laterality: Dorsal, Left Peri-Wound Care: Sween Lotion (Moisturizing lotion) (Home Health) 3 x Per Day/30 Days Discharge Instructions: Apply moisturizing lotion to dry skin on legs Prim Dressing: Maxorb Extra Calcium Alginate 2x2 in (Home Health) 3 x Per Day/30 Days ary Discharge Instructions: Apply calcium alginate to wound bed as instructed Secondary Dressing: ABD Pad, 8x10 (Home Health) 3 x Per Day/30 Days Discharge Instructions: Apply over primary dressing as directed. Secondary Dressing: Zetuvit Plus 4x4 in (Home Health) 3 x Per Day/30 Days Discharge Instructions: or equivalent extra absorbent pad.Apply over primary dressing as directed. Com pression Wrap: ThreePress (3 layer compression wrap) (Home Health) 3 x Per Day/30 Days Discharge Instructions: Apply three layer compression as directed. Pad bend of ankle with foam or ABD pad. WOUND #67: - Lower Leg Wound Laterality: Right, Medial Peri-Wound Care: Sween Lotion (Moisturizing lotion) (Home Health) 3 x Per Day/30 Days Discharge Instructions: Apply  moisturizing lotion to dry skin on legs Prim Dressing: Maxorb Extra Calcium Alginate 2x2 in (Home Health) 3 x Per Day/30 Days ary Discharge Instructions: Apply calcium alginate to wound bed as instructed Secondary Dressing: ABD Pad, 8x10 (Home Health) 3 x Per Day/30 Days Discharge Instructions: Apply over primary dressing as directed. Secondary Dressing: Zetuvit Plus 4x4 in (Home Health) 3 x Per Day/30 Days Discharge Instructions: or equivalent extra absorbent pad.Apply over primary dressing as directed. Com pression Wrap: ThreePress (3 layer compression wrap) (Home Health) 3 x Per Day/30 Days Discharge Instructions: Apply three layer compression as directed. Pad bend of ankle with foam or ABD pad. WOUND #69: - Foot Wound Laterality: Dorsal, Right Peri-Wound Care: Sween Lotion (Moisturizing lotion) (Home Health) 3 x Per Day/30 Days Discharge Instructions: Apply moisturizing lotion to dry skin on legs Prim Dressing: Maxorb Extra Calcium Alginate 2x2 in (Home Health) 3 x Per Day/30 Days ary Discharge Instructions: Apply calcium alginate to wound bed as instructed Secondary Dressing: ABD Pad, 8x10 (Home Health) 3 x Per Day/30 Days Discharge Instructions: Apply over primary dressing as directed. Secondary Dressing: Zetuvit Plus 4x4 in (Home Health) 3 x Per Day/30 Days Discharge Instructions: or equivalent extra absorbent pad.Apply over primary dressing as directed. Com pression Wrap: ThreePress (3 layer compression wrap) (Home Health) 3 x Per Day/30 Days Discharge Instructions: Apply three layer compression as directed. Pad bend of ankle with foam or ABD pad. WOUND #70: - Lower Leg Wound Laterality: Right, Anterior Peri-Wound Care: Sween Lotion (Moisturizing lotion) (Home Health) 3 x Per Day/30 Days Discharge Instructions: Apply moisturizing lotion to dry skin on legs Prim Dressing: Maxorb Extra Calcium Alginate 2x2 in (Home Health) 3 x Per Day/30 Days ary Discharge Instructions: Apply calcium  alginate to wound bed as instructed Secondary Dressing: ABD Pad, 8x10 (Home Health) 3 x Per Day/30 Days Discharge Instructions: Apply over primary dressing as directed. Secondary Dressing: Zetuvit Plus 4x4 in (Home Health) 3 x Per Day/30 Days Discharge Instructions: or equivalent extra absorbent pad.Apply over primary dressing as directed. Com pression Wrap: ThreePress (3 layer compression wrap) (Home Health) 3 x Per Day/30 Days Discharge Instructions: Apply three layer compression as directed. Pad bend of ankle with foam or ABD pad. 1. Would recommend currently that we going continue with the wound care measures as before and the patient is in agreement with plan that includes the use of the plain  alginate followed by the ABD pads and Tubigrip to help with The Drainage. 2. I Am Going to Recommend That We Continue with the 3 Layer Compression Wrap Which I Do Feel like Has Been Beneficial. 3. I Am Also Going to Suggest the Patient Needs to Elevate Her Legs Much As Possible to Try to Keep Edema under Good Control. Obviously the More That She Can Do Staying Away from Sitting in Her Chair for Long Periods of Time I Think This Is Been the Best. We will see patient back for reevaluation in 2 weeks here in the clinic. If anything worsens or changes patient will contact our office for additional recommendations. Electronic Signature(s) Signed: 10/24/2020 3:55:20 PM By: [name] PA-C Entered By: [name] on 10/24/2020 15:55:20 -------------------------------------------------------------------------------- SuperBill Details Patient Name: Date of Service: [name] 10/24/2020 Medical Record Number: [id number] Patient Account Number: [id number] Date of Birth/Sex: Treating RN: 1950-10-31 (70 y.o. [name] Primary Care Provider: [name] Other Clinician: Referring Provider: Treating Provider/Extender: Darlen Round in Treatment: 168 Diagnosis  Coding ICD-10 Codes Code Description E11.622 Type 2 diabetes mellitus with other skin ulcer I89.0 Lymphedema, not elsewhere classified I87.331 Chronic venous hypertension (idiopathic) with ulcer and inflammation of right lower extremity I87.332 Chronic venous hypertension (idiopathic) with ulcer and inflammation of left lower extremity L97.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 leg with fat layer exposed L97.822 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 I10 Essential (primary) hypertension E66.01 Morbid (severe) obesity due to excess calories F41.8 Other specified anxiety disorders R53.1 Weakness Facility Procedures The patient participates with Medicare or their insurance follows the Medicare Facility Guidelines: CPT4 Code Description Modifier Quantity [id number] - DEBRIDE WOUND 1ST 20 SQ CM OR < 1 ICD-10 Diagnosis Description L97.812 Non-pressure chronic ulcer  of other part of right lower leg with fat layer exposed L97.822 Non-pressure chronic ulcer of other part of left lower leg with fat layer exposed The patient participates with Medicare or their insurance follows the Medicare Facility Guidelines: [id number] - DEBRIDE WOUND EA ADDL 20 SQ CM 9 ICD-10 Diagnosis Description L97.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 leg with  fat layer exposed L97.822 Non-pressure chronic ulcer of other part of left lower leg with fat layer exposed Physician Procedures : CPT4 Code Description Modifier [id number] - WC PHYS DEBR WO ANESTH 20 SQ CM ICD-10 Diagnosis Description L97.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 leg with fat layer exposed L97.822 Non-pressure chronic ulcer of other part of  left lower leg with fat layer exposed Quantity: 1 : [id number] - WC PHYS DEBR WO ANESTH EA ADD 20 CM ICD-10  Diagnosis Description G90.301 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 leg with fat layer exposed L97.822 Non-pressure chronic ulcer of other part of left lower leg with fat  layer exposed Quantity: 9 Electronic Signature(s) Signed: 10/24/2020 3:55:38 PM By: [name] PA-C Entered By: [name] on 10/24/2020 15:55:36

## 2020-10-24 NOTE — Progress Notes (Signed)
STEFAN, KAREN ([id number]) Visit Report for 10/24/2020 Arrival Information Details Patient Name: Date of Service: [name], [name] 10/24/2020 11:00 A M Medical Record Number: [id number] Patient Account Number: [id number] Date of Birth/Sex: Treating RN: 1950-11-26 (70 y.o. [name] Primary Care Clay Solum: [name] Other Clinician: Referring Bayard More: Treating Josie Mesa/Extender: Darlen Round in Treatment: 58 Visit Information History Since Last Visit Added or deleted any medications: No Patient Arrived: Wheel Chair Any new allergies or adverse reactions: No Arrival Time: 11:33 Had a fall or experienced change in No Accompanied By: son activities of daily living that may affect Transfer Assistance: None risk of falls: Patient Requires Transmission-Based Precautions: No Signs or symptoms of abuse/neglect since last visito No Patient Has Alerts: Yes Hospitalized since last visit: No Patient Alerts: R ABI= 1.01 Implantable device outside of the clinic excluding No L ABI = .99 cellular tissue based products placed in the center since last visit: Has Dressing in Place as Prescribed: Yes Has Compression in Place as Prescribed: Yes Pain Present Now: Yes Electronic Signature(s) Signed: 10/24/2020 5:58:33 PM By: Levan Hurst RN, BSN Entered By: Levan Hurst on 10/24/2020 11:33:43 -------------------------------------------------------------------------------- Compression Therapy Details Patient Name: Date of Service: [name] 10/24/2020 11:00 A M Medical Record Number: [id number] Patient Account Number: [id number] Date of Birth/Sex: Treating RN: 14-Apr-1951 (70 y.o. [name] Primary Care Elizar Alpern: [name] Other Clinician: Referring Eddie Koc: Treating Merridith Dershem/Extender: Darlen Round in Treatment: 168 Compression Therapy Performed for Wound Assessment: Wound #61 Left,Circumferential Lower Leg Performed  By: Clinician Rhae Hammock, RN Compression Type: Three Layer Post Procedure Diagnosis Same as Pre-procedure Electronic Signature(s) Signed: 10/24/2020 6:13:52 PM By: [name] RN, BSN Entered By: [name] on 10/24/2020 12:04:42 -------------------------------------------------------------------------------- Compression Therapy Details Patient Name: Date of Service: [name] 10/24/2020 11:00 A M Medical Record Number: [id number] Patient Account Number: [id number] Date of Birth/Sex: Treating RN: January 06, 1951 (70 y.o. [name] Primary Care Makael Stein: [name] Other Clinician: Referring Virdie Penning: Treating Keirah Konitzer/Extender: Darlen Round in Treatment: 168 Compression Therapy Performed for Wound Assessment: Wound #67 Right,Medial Lower Leg Performed By: Clinician Rhae Hammock, RN Compression Type: Three Layer Post Procedure Diagnosis Same as Pre-procedure Electronic Signature(s) Signed: 10/24/2020 6:13:52 PM By: [name] RN, BSN Entered By: [name] on 10/24/2020 12:04:42 -------------------------------------------------------------------------------- Encounter Discharge Information Details Patient Name: Date of Service: [name] 10/24/2020 11:00 A M Medical Record Number: [id number] Patient Account Number: [id number] Date of Birth/Sex: Treating RN: 29-Dec-1950 (70 y.o. [name] Primary Care Deloris Moger: [name] Other Clinician: Referring Breann Losano: Treating Obera Stauch/Extender: Darlen Round in Treatment: [phone] Encounter Discharge Information Items Post Procedure Vitals Discharge Condition: Stable Temperature (F): 98.3 Ambulatory Status: Wheelchair Pulse (bpm): 88 Discharge Destination: Home Respiratory Rate (breaths/min): 20 Transportation: Private Auto Blood Pressure (mmHg): 147/91 Accompanied By: son Schedule Follow-up Appointment: Yes Clinical Summary of  Care: Electronic Signature(s) Signed: 10/24/2020 5:30:57 PM By: Deon Pilling Entered By: Deon Pilling on 10/24/2020 17:25:01 -------------------------------------------------------------------------------- Lower Extremity Assessment Details Patient Name: Date of Service: SUHAILA, TROIANO 10/24/2020 11:00 A M Medical Record Number: [id number] Patient Account Number: [id number] Date of Birth/Sex: Treating RN: January 09, 1951 (70 y.o. [name] Primary Care Keymarion Bearman: [name] Other Clinician: Referring Derald Lorge: Treating Shayonna Ocampo/Extender: Darlen Round in Treatment: 168 Edema Assessment Assessed: [name]: No] [Right: No] Edema: [Left: Yes] [Right: Yes] Calf Left: Right: Point of Measurement: 37 cm From Medial Instep 38 cm 42 cm  Ankle Left: Right: Point of Measurement: 10 cm From Medial Instep 30 cm 25 cm Vascular Assessment Pulses: Dorsalis Pedis Palpable: [Left:Yes] [Right:Yes] Electronic Signature(s) Signed: 10/24/2020 5:58:33 PM By: Levan Hurst RN, BSN Entered By: Levan Hurst on 10/24/2020 11:57:50 -------------------------------------------------------------------------------- Royal Details Patient Name: Date of Service: [name] 10/24/2020 11:00 A M Medical Record Number: [id number] Patient Account Number: [id number] Date of Birth/Sex: Treating RN: 21-Jan-1951 (70 y.o. [name] Primary Care Jamille Yoshino: [name] Other Clinician: Referring Tiffani Kadow: Treating Nazaire Cordial/Extender: Darlen Round in Treatment: [place] reviewed with physician Active Inactive Venous Leg Ulcer Nursing Diagnoses: Actual venous Insuffiency (use after diagnosis is confirmed) Knowledge deficit related to disease process and management Goals: Patient will maintain optimal edema control Date Initiated: 08/12/2017 Target Resolution Date: 11/21/2020 Goal Status:  Active Patient/caregiver will verbalize understanding of disease process and disease management Date Initiated: 08/12/2017 Date Inactivated: 04/21/2018 Target Resolution Date: 04/24/2018 Goal Status: Met Interventions: Assess peripheral edema status every visit. Compression as ordered Treatment Activities: Therapeutic compression applied : 08/12/2017 Notes: Wound/Skin Impairment Nursing Diagnoses: Impaired tissue integrity Knowledge deficit related to ulceration/compromised skin integrity Goals: Patient/caregiver will verbalize understanding of skin care regimen Date Initiated: 08/12/2017 Target Resolution Date: 11/21/2020 Goal Status: Active Ulcer/skin breakdown will have a volume reduction of 30% by week 4 Date Initiated: 08/05/2017 Date Inactivated: 09/30/2017 Target Resolution Date: 10/03/2017 Goal Status: Met Ulcer/skin breakdown will have a volume reduction of 50% by week 8 Date Initiated: 09/30/2017 Date Inactivated: 10/28/2017 Target Resolution Date: 10/28/2017 Goal Status: Met Interventions: Assess patient/caregiver ability to perform ulcer/skin care regimen upon admission and as needed Assess ulceration(s) every visit Provide education on ulcer and skin care Screen for HBO Treatment Activities: Patient referred to home care : 08/05/2017 Skin care regimen initiated : 08/05/2017 Topical wound management initiated : 08/05/2017 Notes: Electronic Signature(s) Signed: 10/24/2020 6:13:52 PM By: [name] RN, BSN Entered By: [name] on 10/24/2020 11:58:47 -------------------------------------------------------------------------------- Pain Assessment Details Patient Name: Date of Service: [name] 10/24/2020 11:00 A M Medical Record Number: [id number] Patient Account Number: [id number] Date of Birth/Sex: Treating RN: [birth year] (70 y.o. [name] Primary Care Delpha Perko: [name] Other Clinician: Referring Midge Momon: Treating Sirenity Shew/Extender:  Darlen Round in Treatment: 168 Active Problems Location of Pain Severity and Description of Pain Patient Has Paino Yes Site Locations Pain Location: Pain in Ulcers With Dressing Change: Yes Rate the pain. Current Pain Level: 6 Character of Pain Describe the Pain: Burning Pain Management and Medication Current Pain Management: Medication: Yes Cold Application: No Rest: No Massage: No Activity: No T.E.N.S.: No Heat Application: No Leg drop or elevation: No Is the Current Pain Management Adequate: Adequate How does your wound impact your activities of daily livingo Sleep: No Bathing: No Appetite: No Relationship With Others: No Bladder Continence: No Emotions: No Bowel Continence: No Work: No Toileting: No Drive: No Dressing: No Hobbies: No Electronic Signature(s) Signed: 10/24/2020 5:58:33 PM By: Levan Hurst RN, BSN Entered By: Levan Hurst on 10/24/2020 11:34:21 -------------------------------------------------------------------------------- Patient/Caregiver Education Details Patient Name: Date of Service: Stegeman, Virdie M. 5/11/2022andnbsp11:00 [place] Record Number: [id number] Patient Account Number: [id number] Date of Birth/Gender: Treating RN: 03-31-1951 (70 y.o. [name] Primary Care Physician: [name] Other Clinician: Referring Physician: Treating Physician/Extender: Darlen Round in Treatment: 69 Education Assessment Education Provided To: Patient Education Topics Provided Venous: Methods: Explain/Verbal Responses: Reinforcements needed, State content correctly Wound/Skin Impairment: Methods: Explain/Verbal Responses: Reinforcements  needed, State content correctly Electronic Signature(s) Signed: 10/24/2020 6:13:52 PM By: [name] RN, BSN Entered By: [name] on 10/24/2020 11:59:21 -------------------------------------------------------------------------------- Wound  Assessment Details Patient Name: Date of Service: [name] 10/24/2020 11:00 A M Medical Record Number: [id number] Patient Account Number: [id number] Date of Birth/Sex: Treating RN: 05-20-1951 (70 y.o. [name] Primary Care Nygel Prokop: [name] Other Clinician: Referring Mikhaela Zaugg: Treating Vinal Rosengrant/Extender: Darlen Round in Treatment: 168 Wound Status Wound Number: 61 Primary Venous Leg Ulcer Etiology: Wound Location: Left, Circumferential Lower Leg Wound Open Wounding Event: Gradually Appeared Wounding Event: Gradually Appeared Status: Date Acquired: 04/20/2019 Comorbid Asthma, Hypertension, Peripheral Arterial Disease, Peripheral Weeks Of Treatment: 79 History: Venous Disease, Type II Diabetes, Gout, Osteoarthritis Clustered Wound: Yes Photos Wound Measurements Length: (cm) 14 Width: (cm) 32 Depth: (cm) 0.1 Area: (cm) 351.858 Volume: (cm) 35.186 % Reduction in Area: -4566.6% % Reduction in Volume: -4566.6% Epithelialization: None Tunneling: No Undermining: No Wound Description Classification: Full Thickness Without Exposed Support Structures Wound Margin: Indistinct, nonvisible Exudate Amount: Medium Exudate Type: Serous Exudate Color: amber Foul Odor After Cleansing: No Slough/Fibrino No Wound Bed Granulation Amount: Large (67-100%) Exposed Structure Granulation Quality: Pink, Pale Fascia Exposed: No Necrotic Amount: None Present (0%) Fat Layer (Subcutaneous Tissue) Exposed: Yes Tendon Exposed: No Muscle Exposed: No Joint Exposed: No Bone Exposed: No Treatment Notes Wound #61 (Lower Leg) Wound Laterality: Left, Circumferential Cleanser Peri-Wound Care Sween Lotion (Moisturizing lotion) Discharge Instruction: Apply moisturizing lotion to dry skin on legs Topical Primary Dressing Maxorb Extra Calcium Alginate 2x2 in Discharge Instruction: Apply calcium alginate to wound bed as instructed Secondary Dressing ABD  Pad, 8x10 Discharge Instruction: Apply over primary dressing as directed. Zetuvit Plus 4x4 in Discharge Instruction: or equivalent extra absorbent pad.Apply over primary dressing as directed. Secured With Compression Wrap ThreePress (3 layer compression wrap) Discharge Instruction: Apply three layer compression as directed. Pad bend of ankle with foam or ABD pad. Compression Stockings Add-Ons Electronic Signature(s) Signed: 10/24/2020 4:50:41 PM By: Sandre Kitty Signed: 10/24/2020 5:58:33 PM By: Levan Hurst RN, BSN Entered By: Sandre Kitty on 10/24/2020 16:37:23 -------------------------------------------------------------------------------- Wound Assessment Details Patient Name: Date of Service: [name] 10/24/2020 11:00 A M Medical Record Number: [id number] Patient Account Number: [id number] Date of Birth/Sex: Treating RN: [birth year] (70 y.o. [name] Primary Care Davisha Linthicum: [name] Other Clinician: Referring Jalecia Leon: Treating Daley Gosse/Extender: Darlen Round in Treatment: 168 Wound Status Wound Number: 64 Primary Diabetic Wound/Ulcer of the Lower Extremity Etiology: Wound Location: Left, Dorsal Foot Wound Open Wounding Event: Gradually Appeared Status: Date Acquired: 06/01/2019 Comorbid Asthma, Hypertension, Peripheral Arterial Disease, Peripheral Weeks Of Treatment: 73 History: Venous Disease, Type II Diabetes, Gout, Osteoarthritis Clustered Wound: No Photos Wound Measurements Length: (cm) 6 Width: (cm) 7 Depth: (cm) 0.1 Area: (cm) 32.987 Volume: (cm) 3.299 % Reduction in Area: -19892.1% % Reduction in Volume: -6632.7% Epithelialization: Small (1-33%) Tunneling: No Undermining: No Wound Description Classification: Grade 1 Wound Margin: Indistinct, nonvisible Exudate Amount: Medium Exudate Type: Serous Exudate Color: amber Foul Odor After Cleansing: No Slough/Fibrino No Wound Bed Granulation Amount:  Large (67-100%) Exposed Structure Granulation Quality: Pink, Pale Fascia Exposed: No Necrotic Amount: None Present (0%) Fat Layer (Subcutaneous Tissue) Exposed: Yes Tendon Exposed: No Muscle Exposed: No Joint Exposed: No Bone Exposed: No Treatment Notes Wound #64 (Foot) Wound Laterality: Dorsal, Left Cleanser Peri-Wound Care Sween Lotion (Moisturizing lotion) Discharge Instruction: Apply moisturizing lotion to dry skin on legs Topical Primary Dressing Maxorb Extra Calcium Alginate 2x2 in  Discharge Instruction: Apply calcium alginate to wound bed as instructed Secondary Dressing ABD Pad, 8x10 Discharge Instruction: Apply over primary dressing as directed. Zetuvit Plus 4x4 in Discharge Instruction: or equivalent extra absorbent pad.Apply over primary dressing as directed. Secured With Compression Wrap ThreePress (3 layer compression wrap) Discharge Instruction: Apply three layer compression as directed. Pad bend of ankle with foam or ABD pad. Compression Stockings Add-Ons Electronic Signature(s) Signed: 10/24/2020 4:50:41 PM By: Sandre Kitty Signed: 10/24/2020 5:58:33 PM By: Levan Hurst RN, BSN Entered By: Sandre Kitty on 10/24/2020 16:36:45 -------------------------------------------------------------------------------- Wound Assessment Details Patient Name: Date of Service: [name] 10/24/2020 11:00 A M Medical Record Number: [id number] Patient Account Number: [id number] Date of Birth/Sex: Treating RN: 03/15/1951 (70 y.o. [name] Primary Care Tyshawn Keel: [name] Other Clinician: Referring Lylia Karn: Treating Daquarius Dubeau/Extender: Darlen Round in Treatment: 168 Wound Status Wound Number: 67 Primary Diabetic Wound/Ulcer of the Lower Extremity Etiology: Wound Location: Right, Medial Lower Leg Wound Open Wounding Event: Gradually Appeared Status: Date Acquired: 05/02/2020 Comorbid Asthma, Hypertension, Peripheral  Arterial Disease, Peripheral Weeks Of Treatment: 25 History: Venous Disease, Type II Diabetes, Gout, Osteoarthritis Clustered Wound: No Photos Wound Measurements Length: (cm) 4 Width: (cm) 6 Depth: (cm) 0.1 Area: (cm) 18.85 Volume: (cm) 1.885 Wound Description Classification: Grade 1 Wound Margin: Indistinct, nonvisible Exudate Amount: Medium Exudate Type: Serosanguineous Exudate Color: red, brown Foul Odor After Cleansing: No Slough/Fibrino No % Reduction in Area: -1233.1% % Reduction in Volume: -1236.9% Epithelialization: Small (1-33%) Tunneling: No Undermining: No Wound Bed Granulation Amount: Large (67-100%) Exposed Structure Granulation Quality: Pink, Pale Fascia Exposed: No Necrotic Amount: None Present (0%) Fat Layer (Subcutaneous Tissue) Exposed: Yes Tendon Exposed: No Muscle Exposed: No Joint Exposed: No Bone Exposed: No Treatment Notes Wound #67 (Lower Leg) Wound Laterality: Right, Medial Cleanser Peri-Wound Care Sween Lotion (Moisturizing lotion) Discharge Instruction: Apply moisturizing lotion to dry skin on legs Topical Primary Dressing Maxorb Extra Calcium Alginate 2x2 in Discharge Instruction: Apply calcium alginate to wound bed as instructed Secondary Dressing ABD Pad, 8x10 Discharge Instruction: Apply over primary dressing as directed. Zetuvit Plus 4x4 in Discharge Instruction: or equivalent extra absorbent pad.Apply over primary dressing as directed. Secured With Compression Wrap ThreePress (3 layer compression wrap) Discharge Instruction: Apply three layer compression as directed. Pad bend of ankle with foam or ABD pad. Compression Stockings Add-Ons Electronic Signature(s) Signed: 10/24/2020 4:50:41 PM By: Sandre Kitty Signed: 10/24/2020 5:58:33 PM By: Levan Hurst RN, BSN Entered By: Sandre Kitty on 10/24/2020 16:34:59 -------------------------------------------------------------------------------- Wound Assessment  Details Patient Name: Date of Service: [name] 10/24/2020 11:00 A M Medical Record Number: [id number] Patient Account Number: [id number] Date of Birth/Sex: Treating RN: [birth year] (70 y.o. [name] Primary Care Shawnda Mauney: [name] Other Clinician: Referring Denyce Harr: Treating Destinee Taber/Extender: Darlen Round in Treatment: 168 Wound Status Wound Number: 69 Primary Lymphedema Etiology: Wound Location: Right, Dorsal Foot Secondary Diabetic Wound/Ulcer of the Lower Extremity Wounding Event: Gradually Appeared Etiology: Date Acquired: 07/25/2020 Date Acquired: 07/25/2020 Wound Open Weeks Of Treatment: 13 Status: Clustered Wound: No Comorbid Asthma, Hypertension, Peripheral Arterial Disease, Peripheral History: Venous Disease, Type II Diabetes, Gout, Osteoarthritis Photos Wound Measurements Length: (cm) 1.4 Width: (cm) 0.5 Depth: (cm) 0.2 Area: (cm) 0.55 Volume: (cm) 0.11 % Reduction in Area: 82.5% % Reduction in Volume: 65% Epithelialization: Small (1-33%) Tunneling: No Undermining: No Wound Description Classification: Full Thickness Without Exposed Support Structures Wound Margin: Flat and Intact Exudate Amount: Medium Exudate Type: Serosanguineous Exudate Color: red, brown Foul  Odor After Cleansing: No Slough/Fibrino Yes Wound Bed Granulation Amount: Large (67-100%) Exposed Structure Granulation Quality: Pink Fascia Exposed: No Necrotic Amount: Small (1-33%) Fat Layer (Subcutaneous Tissue) Exposed: Yes Necrotic Quality: Adherent Slough Tendon Exposed: No Muscle Exposed: No Joint Exposed: No Bone Exposed: No Treatment Notes Wound #69 (Foot) Wound Laterality: Dorsal, Right Cleanser Peri-Wound Care Sween Lotion (Moisturizing lotion) Discharge Instruction: Apply moisturizing lotion to dry skin on legs Topical Primary Dressing Maxorb Extra Calcium Alginate 2x2 in Discharge Instruction: Apply calcium alginate to wound  bed as instructed Secondary Dressing ABD Pad, 8x10 Discharge Instruction: Apply over primary dressing as directed. Zetuvit Plus 4x4 in Discharge Instruction: or equivalent extra absorbent pad.Apply over primary dressing as directed. Secured With Compression Wrap ThreePress (3 layer compression wrap) Discharge Instruction: Apply three layer compression as directed. Pad bend of ankle with foam or ABD pad. Compression Stockings Add-Ons Electronic Signature(s) Signed: 10/24/2020 4:50:41 PM By: Sandre Kitty Signed: 10/24/2020 5:58:33 PM By: Levan Hurst RN, BSN Entered By: Sandre Kitty on 10/24/2020 16:34:09 -------------------------------------------------------------------------------- Wound Assessment Details Patient Name: Date of Service: [name] 10/24/2020 11:00 A M Medical Record Number: [id number] Patient Account Number: [id number] Date of Birth/Sex: Treating RN: 1951/01/08 (70 y.o. [name] Primary Care Lerae Langham: [name] Other Clinician: Referring Meila Berke: Treating Clancy Leiner/Extender: Darlen Round in Treatment: 168 Wound Status Wound Number: 70 Primary Venous Leg Ulcer Etiology: Wound Location: Right, Anterior Lower Leg Wound Open Wounding Event: Gradually Appeared Status: Date Acquired: 08/29/2020 Comorbid Asthma, Hypertension, Peripheral Arterial Disease, Peripheral Weeks Of Treatment: 8 History: Venous Disease, Type II Diabetes, Gout, Osteoarthritis Clustered Wound: No Photos Wound Measurements Length: (cm) 3 Width: (cm) 1 Depth: (cm) 0.1 Area: (cm) 2.356 Volume: (cm) 0.236 % Reduction in Area: -100% % Reduction in Volume: -100% Epithelialization: Medium (34-66%) Tunneling: No Undermining: No Wound Description Classification: Full Thickness Without Exposed Support Structures Wound Margin: Flat and Intact Exudate Amount: Medium Exudate Type: Serous Exudate Color: amber Foul Odor After Cleansing:  No Slough/Fibrino No Wound Bed Granulation Amount: Large (67-100%) Exposed Structure Granulation Quality: Pink Fascia Exposed: No Necrotic Amount: None Present (0%) Fat Layer (Subcutaneous Tissue) Exposed: Yes Tendon Exposed: No Muscle Exposed: No Joint Exposed: No Bone Exposed: No Treatment Notes Wound #70 (Lower Leg) Wound Laterality: Right, Anterior Cleanser Peri-Wound Care Sween Lotion (Moisturizing lotion) Discharge Instruction: Apply moisturizing lotion to dry skin on legs Topical Primary Dressing Maxorb Extra Calcium Alginate 2x2 in Discharge Instruction: Apply calcium alginate to wound bed as instructed Secondary Dressing ABD Pad, 8x10 Discharge Instruction: Apply over primary dressing as directed. Zetuvit Plus 4x4 in Discharge Instruction: or equivalent extra absorbent pad.Apply over primary dressing as directed. Secured With Compression Wrap ThreePress (3 layer compression wrap) Discharge Instruction: Apply three layer compression as directed. Pad bend of ankle with foam or ABD pad. Compression Stockings Add-Ons Electronic Signature(s) Signed: 10/24/2020 4:50:41 PM By: Sandre Kitty Signed: 10/24/2020 5:58:33 PM By: Levan Hurst RN, BSN Entered By: Sandre Kitty on 10/24/2020 16:33:34 -------------------------------------------------------------------------------- Vitals Details Patient Name: Date of Service: [name] 10/24/2020 11:00 A M Medical Record Number: [id number] Patient Account Number: [id number] Date of Birth/Sex: Treating RN: 12-25-1950 (70 y.o. [name] Primary Care Clevland Cork: [name] Other Clinician: Referring Demitris Pokorny: Treating Leiliana Foody/Extender: Darlen Round in Treatment: 168 Vital Signs Time Taken: 11:33 Temperature (F): 98.3 Height (in): 62 Pulse (bpm): 88 Weight (lbs): 335 Respiratory Rate (breaths/min): 20 Body Mass Index (BMI): 61.3 Blood Pressure (mmHg): 147/91 Capillary Blood  Glucose (mg/dl): 160  Reference Range: 80 - 120 mg / dl Notes glucose per pt report Electronic Signature(s) Signed: 10/24/2020 5:58:33 PM By: Levan Hurst RN, BSN Entered By: Levan Hurst on 10/24/2020 11:34:05

## 2020-10-27 LAB — AEROBIC CULTURE W GRAM STAIN (SUPERFICIAL SPECIMEN): Gram Stain: NONE SEEN

## 2020-11-07 ENCOUNTER — Other Ambulatory Visit: Payer: Self-pay

## 2020-11-07 ENCOUNTER — Encounter (HOSPITAL_BASED_OUTPATIENT_CLINIC_OR_DEPARTMENT_OTHER): Payer: Medicare PPO | Admitting: Internal Medicine

## 2020-11-07 DIAGNOSIS — E11622 Type 2 diabetes mellitus with other skin ulcer: Secondary | ICD-10-CM | POA: Diagnosis not present

## 2020-11-07 NOTE — Progress Notes (Signed)
[name] ([id number]) Visit Report for 11/07/2020 Chief Complaint Document Details Patient Name: Date of Service: [name] 11/07/2020 11:00 A M Medical Record Number: [id number] Patient Account Number: [id number] Date of Birth/Sex: Treating RN: 1950/07/19 (70 y.o. [name] Primary Care Provider: [name] Other Clinician: Referring Provider: Treating Provider/Extender: Catalina Pizza in Treatment: 170 Information Obtained from: Patient Chief Complaint Bilateral reoccurring LE ulcers Electronic Signature(s) Signed: 11/07/2020 12:19:35 PM By: Kalman Shan DO Entered By: Kalman Shan on 11/07/2020 12:13:36 -------------------------------------------------------------------------------- HPI Details Patient Name: Date of Service: [name]. 11/07/2020 11:00 A M Medical Record Number: [id number] Patient Account Number: [id number] Date of Birth/Sex: Treating RN: 25-Aug-1950 (70 y.o. [name] Primary Care Provider: [name] Other Clinician: Referring Provider: Treating Provider/Extender: Catalina Pizza in Treatment: 170 History of Present Illness HPI Description: this patient has been seen a couple of times before and returns with recurrent problems to her right and left lower extremity with swelling and weeping ulcerations due to not wearing her compression stockings which she had been advised to do during her last discharge, at the end of June 2018. During her last visit the patient had had normal arterial blood flow and her venous reflux study did not necessitate any surgical intervention. She was recommended compression and elevation and wound care. After prolonged treatment the patient was completely healed but she has been noncompliant with wearing or compressions.. She was here last week with an outpatient return visit planned but the patient came in a very poor general condition with altered  mental status and was rushed to the ER on my request. With a history of hypertension, diabetes, TIA and right-sided weakness she was set up for an MRI on her brain and cervical spine and was sent to [facility]. Getting an MRI done was very difficult but once the workup was done she was found not to have any spinal stenosis, epidural abscess or hematoma or discitis. This was radiculopathy to be treated as an outpatient and she was given a follow-up appointment. Today she is feeling much better alert and oriented and has come to reevaluate her bilateral lower extremity lymphedema and ulceration 03/25/2017 -- she was admitted to the hospital on 03/16/2017 and discharged on 03/18/2017 with left leg cellulitis and ulceration. She was started on vancomycin and Zosyn and x-ray showed no bony involvement. She was treated for a cellulitis with IV antibiotics changed to Rocephin and Flagyl and was discharged on oral Keflex and doxycycline to complete a 7 day course. Last hemoglobin A1c was 7.1 and her other ailments including hypertension got asthma were appropriately treated. 05/06/2017 -- she is awaiting the right size of compression stockings from [place] but other than that has been doing well. ====== Old notes 70 year old patient was seen one time last October and was lost to follow-up. She has recurrent problems with weeping and ulceration of her left lower extremity and has swelling of this for several years. It has been worse for the last 2 months. Past medical history is significant for diabetes mellitus type 2, hypertension, gout, morbid obesity, depressive disorders, hiatal hernia, migraines, status post knee surgery, risk of a cholecystectomy, vaginal hysterectomy and breast biopsy. She is not a smoker. As noted before she has never had a venous duplex study and an arterial ABI study was attempted but the left lower extremity was noncompressible 10/01/2016 -- had a lower extremity venous duplex  reflux evaluation which showed no evidence of deep  vein reflux in the right or left lower extremity, and no evidence of great saphenous vein reflux more than 500 ms in the right or left lower extremity, and the left small saphenous vein is incompetent but no vascular consult was recommended. review of her electronic medical records noted that the ABI was checked in July 2017 where the right ABI was normal limits and the left ABI could not be ascertained due to pain with cuff pressure but the waveforms are within normal limits. her arterial duplex study scheduled for April 27. 10/08/2016 -- the patient has various reasons for not having a compression on and for the last 3 days she has had no compression on her left lower extremity either due to pain or the lack of nursing help. She does not use her juxta lites either. 10/15/2016 -- the patient did not keep her appointment for arterial duplex study on April 27 and I have asked her to reschedule this. Her pain is out of proportion with the physical findings and she continuously fails to wear a compression wraps and cuts them off because she says she cannot tolerate the pain. She does not use her juxta lites either. 10/22/2016 -- he has rescheduled her arterial duplex study to May 21 and her pain today is a bit better. She has not been wearing her juxta lites on her right lower extremity but now understands that she needs to do this. She did tolerate the to press compression wrap on her left lower extremity 10/29/2016 --arterial duplex study is scheduled for next week and overall she has been tolerating her compression wraps and also using her juxta lites on her right lower extremity 11/05/2016 -- the right ABI was 0.95 the left was 1.03. The digit TBI is on the right was 0.83 on the left was 0.92 and she had biphasic flow through these vessels. The impression was that of normal lower extremity arterial study. 11/12/2016 -- her pain is minimal and she  is doing very well overall. 11/26/2016 -- she has got juxta lites and her insurance will not pay for additional dual layer compression stockings. She is going to order some from [facility]. 05/12/2017 -- her juxta lites are very old and too big for her and these have not been helping with compression. She did get 20-30 mm compression stockings from [place] but she and her husband are unable to put these on. I believe she will benefit from bilateral Extremit-ease, compression stockings and we will measure her for these today. 05/20/2017 -- lymphedema on the left lower extremity has increased a lot and she has a open ulceration as a result of this. The right lower extremity is looking pretty good. She has decided to by the compression stockings herself and will get reimbursed by the home health, at a later date. 05/27/2017 -- her sciatica is bothering her a lot and she thought her left leg pain was caused due to the compression wrap and hence removed it and has significant lymphedema. There is no inflammation on this left lower extremity. 06/17/17 on evaluation today patient appears to be doing very well and in fact is completely healed in regard to her ulcerations. Unfortunately however she does have continued issues with lymphedema nonetheless. We did order compression garments for her unfortunately she states that the size that she received were large although we ordered medium. Obviously this means she is not getting the optimal compression. She does not have those with her today and therefore we could not confirm  and contact the company on her behalf. Nonetheless she does state that she is going to have her husband bring them by tomorrow so that we can verify and then get in touch with the company. No fevers, chills, nausea, or vomiting noted at this time. Overall patient is doing better otherwise and I'm pleased with the progress she has made. 07/01/17 on evaluation today patient appears to be doing  very well in regard to her bilateral lower extremity she does not have any openings at this point which is excellent news. Overall I'm pleased with how things have progressed up to this time. Since she is doing so well we did order her compression which we are seeing her today to ensure that it fits her properly and everything is doing well in that regard and then subsequently she will be discharged. ============ Old Notes: 03/31/16 patient presents today for evaluation concerning open wounds that she has over the left medial ankle region as well as the left dorsal foot. She has previously had this occur although it has been healed for a number of months after having this for about a year prior until her hospitalization on 01/05/16. At that point in time it appears that she was admitted to the hospital for left lower extremity cellulitis and was placed on vancomycin and Zosyn at that point. Eventually upon discharge on January 15, 2016 she was placed on doxycycline at that point in time. Later on 03/27/16 positive wound culture growing Escherichia coli this was switched to amoxicillin. Currently she tells me that she is having pain radiated to be a 7 out of 10 which can be as high as 10 out of 10 with palpation and manipulation of the wound. This wound appears to be mainly venous in nature due to the bilateral lower extremity venous stasis/lymphedema. This is definitely much worse on her left than the right side. She does have type 1 diabetes mellitus, hypertension, morbid obesity, and is wheelchair dependent.during the course of the hospital stay a blood culture was also obtained and fortunately appeared negative. She also had an x-ray of the tibia/fibula on the left which showed no acute bone abnormality. Her white blood cell count which was performed last on 03/25/16 was 7.3, hemoglobin 12.8, protein 7.1, albumin 3.0. Her urine culture appeared to be negative for any specific organisms. Patient did  have a left lower extremity venous duplex evaluation for DVT . This did not include venous reflux studies but fortunately was negative for DVT Patient also had arterial studies performed which revealed that she had a . normal ABI on the right though this was unable to be performed on the left secondary to pain that she was having around the ankle region due to the wound. However it was stated on report that she had biphasic pulses and apparently good blood flow. ========== 06/03/17 she is here in follow-up evaluation for right lower extremity ulcer. The right lower sure he has healed but she has reopened to the left medial malleolus and dorsal foot with weeping. She is waiting for new compression garments to arrive from home health, the previous compression garments were ill fitting. We will continue with compression bilaterally and follow-up in 2 weeks Readmission: 08/05/17 on evaluation today patient appears to be doing somewhat poorly in regard to her left lower extremity especially although the right lower extremity has a small area which may no longer be open. She has been having a lot of drainage from the left lower extremity however  he tells me that she has not been able to use the EXTREMIT-EASE Compression at this point. She states that she did better and was able to actually apply the Juxta-Lite compression although the wound that she has is too large and therefore really does not compress which is why she cannot wear it at this point. She has no one who can help her put it on regular basis her son can sometimes but he's not able to do it most of the time. I do believe that's why she has begun to weave and have issues as she is currently yet again. No fevers, chills, nausea, or vomiting noted at this time. Patient is no evidence of dementia. 08/12/17 on evaluation today patient appears to still be doing fairly well in regard to the draining areas/weeping areas at this point. With that being  said she unfortunately did go to the ER yesterday due to what was felt to be possibly a cellulitis. They place her on doxycycline by mouth and discharge her home. She definitely was not admitted. With that being said she states she has had more discomfort which has been unusual for her even compared to prior times and she's had infections.08/12/17 on evaluation today patient appears to still be doing fairly well in regard to the draining areas/weeping areas at this point. With that being said she unfortunately did go to the ER yesterday due to what was felt to be possibly a cellulitis. They place her on doxycycline by mouth and discharge her home. She definitely was not admitted. With that being said she states she has had more discomfort which has been unusual for her even compared to prior times and she's had infections. 08/19/17 put evaluation today patient tells me that she's been having a lot of what sounds to be neuropathic type pain in regard to her left lower extremity. She has been using over-the-counter topical bins again which some believe. That in order to apply the she actually remove the wrap we put on her last Wednesday on Thursday. Subsequently she has not had anything on compression wise since that time. The good news is a lot of the weeping areas appear to have closed at this point again I believe she would do better with compression but we are struggling to get her to actually use what she needs to at this point. No fevers, chills, nausea, or vomiting noted at this time. 09/03/17 on evaluation today patient appears to be doing okay in regard to her lower extremities in regard to the lymphedema and weeping. Fortunately she does not seem to show any signs of infection at this point she does have a little bit of weeping occurring in the right medial malleolus area. With that being said this does not appear to be too significant which is good news. 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 She has severe skin damage secondary to both of these features involving the dorsal left foot and medial left ankle and lower leg. Still has open areas in the left anterior foot. The area on the right closed over. She uses her own juxta light stockings. She does not have an arterial issue 09/16/17 on evaluation today patient actually appears to be doing excellent in regard to her bilateral lower extremity swelling. The Juxta-Lite compression wrap seem to be doing very well for her. She has not however been using the portion that goes over her foot. Her left foot still  is draining a little bit not nearly as significant as it has been in the past but still I do believe that she likely needs to utilize the full wrap including the foot portion of this will improve as well. She also has been apparently putting on a significant amount of Vaseline which also think is not helpful for her. I recommended that if she feels she needs something for moisturizer Eucerin will probably be better. 09/30/17 on evaluation today patient presents with several new open areas in regard to her left lower extremity although these appear to be minimal and mainly seem to be more moisture breakdown than anything. Fortunately she does not seem to have any evidence of infection which is great news. She has been tolerating the dressing changes without complication we are using silver alginate on the foot she has been using AB pads to have the legs and using her Juxta- Lite compression which seems to be controlling her swelling very well. Overall I'm pleased with the poor way she has progressed. 10/14/17 on evaluation today patient appears to be doing better in regard to her left lower extremity areas of weeping. She does still have some discomfort although in general this does not appear to be as macerated and I think it is progressing nicely. I do think she still  needs to wear the foot portion of her Juxta- Lite in order to get the most benefit from the wrap obviously. She states she understands. Fortunately there does not appear to be evidence of infection at this time which is great news. 10/28/17 on evaluation today patient appears to be doing excellent in regard to her left lower extremity. She has just a couple areas that are still open and seem to be causing any trouble whatsoever. For that reason I think that she is definitely headed in the right direction the spots are very tiny compared to what we have been dealing with in the past. 11/11/17 on evaluation today patient appears to have a right lateral lower extremity ulcer that has opened since I last saw her. She states this is where the home health nurse that was coming out remove the dressing without wetting the alginate first. Nonetheless I do not know if this is indeed the case or not but more importantly we have not ordered home help to be coming out for her wounds at all. I'm unsure as to why they are coming out and we're gonna have to check on this and get things situated in that regard. With that being said we currently really do not need them to be coming out as the patient has been taking care of her leg herself without complication and no issues. In fact she was doing much better prior to nursing coming out. 11/25/17 on evaluation today patient actually appears to be doing fairly well in regard to her left lower extremity swelling. In fact she has very little area of weeping at this point there's just a small spot on the lateral portion of her right leg that still has me just a little bit more concerned as far as wanting to see this clear up before I discharge her to caring for this at home. Nonetheless overall she has made excellent progress. 12/09/17 on evaluation today patient appears to be doing rather well in regard to her lower extremity edema. She does have some weeping still in the left  lower extremity although the big area we were taking care of two weeks ago actually has  closed and she has another area of weeping on the left lower extremity immediately as well is the top of her foot. She does not currently have lymphedema pumps she has been wearing her compression daily on a regular basis as directed. With that being said I think she may benefit from lymphedema pumps. 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 We had a very difficult time getting and keeping this under control. 12/23/17 on evaluation today patient actually appears to be doing a little bit more poorly in regard to her bilateral lower extremities. She has been tolerating the Juxta-Lite compression wraps. Unfortunately she has two new ulcers on the right lower extremity and left lower [place] ulceration seems to be larger. Obviously this is not good news. She has been tolerating the dressings without complication. 12/30/17 on evaluation today patient actually appears to be doing much better in regard to her bilateral lower extremity edema. She continues to have some issues with ulcerations and in fact there appears to be one spot on each leg where the wrap may have caused a little bit of a blister which is subsequently opened up at this point is given her pain. Fortunately it does not appear to be any evidence of infection which is good news. No fevers chills noted. 01/13/18 on evaluation today patient appears to be doing rather well in regard to her bilateral lower extremities. The dressings did get kind of stuck as far as the wound beds are concerned but again I think this is mainly due to the fact that she actually seems to be showing signs of healing which is good news. She's not having as much drainage therefore she was having more of the dressing sticking.  Nonetheless overall I feel like her swelling is dramatically down compared to previous. 01/20/18 on evaluation today patient unfortunately though she's doing better in most regards has a large blister on the left anterior lower extremity where she is draining quite significantly. Subsequently this is going to need debridement today in order to see what's underneath and ensure she does not continue to trapping fluid at this location. Nonetheless No fevers, chills, nausea, or vomiting noted at this time. 01/27/18 on evaluation today patient appears to be doing rather well at this point in regard to her right lower extremity there's just a very small area that she still has open at this point. With that being said I do believe that she is tolerating the compression wraps very well in making good progress. Home health is coming out at this point to see her. Her left lower extremity on the lateral portion is actually what still mainly open and causing her some discomfort for the most part 02/10/18 on evaluation today patient actually appears to be doing very well in regard to her right lower extremity were all the ulcers appear to be completely close. In regard to the left lower extremity she does have two areas still open and some leaking from the dorsal surface of her foot but this still seems to be doing much better to me in general. 02/24/18 on evaluation today patient actually appears to be doing much better in regard to her right lower extremity this is still completely healed. Her left lower extremity is also doing much better fortunately she has no evidence of infection. The one area that is [place]  require some debridement is still on the left anterior shin. Fortunately this is not hurting her as badly today. 03/10/18 on evaluation today patient appears to be doing better in some regards although she has a little bit more open area on the dorsal foot and she also has some issues on the medial portion of  the left lower extremity which is actually new and somewhat deep. With that being said there fortunately does not appear to be any significant signs of infection which is good news. No fevers, chills, nausea, or vomiting noted at this time. In general her swelling seems to be doing fairly well which is good news. 03/31/18 on evaluation today patient presents for follow-up concerning her left lower extremity lymphedema. Unfortunately she has been doing a little bit more poorly since I last saw her in regard to the amount of weeping that she is experiencing. She's also having some increased pain in the anterior shin location. Unfortunately I do not feel like the patient is making such good progress at this point a few weeks back she was definitely doing much better. 04/07/18 on evaluation today patient actually appears to be showing some signs of improvement as far as the left lower extremity is concerned. She has been tolerating the dressing changes and it does appear that the Drawtex did better for her. With that being said unfortunately home health is stating that they cannot obtain the Drawtex going forward. Nonetheless we're gonna have to check and see what they may be able to get the alginate they were using was getting stuck in causing new areas of skin being pulled all that with and subsequently weep and calls her to worsen overall this is the first time we've seen improvement at this time. 04/14/18 on evaluation today patient actually appears to be doing rather well at this point there does not appear to be any evidence of infection at this time and she is actually doing excellent in regard to the weeping in fact she almost has no openings remaining even compared to just last week this is a dramatic improvement. No fevers chills noted 04/21/18 evaluation today patient actually appears to be doing very well. She in fact is has a small area on the posterior lower extremity location and she has  a small area on the dorsal surface of her foot that are still open both of which are very close to closing. We're hoping this will be close shortly. She brought her Juxta-Lite wrap with her today hoping that would be able to put her in it unfortunately I don't think were quite at that point yet but we're getting closer. 04/28/18 upon evaluation today patient actually appears to be doing excellent in regard to her left lower extremity ulcer. In fact the region on the posterior lower extremity actually is much smaller than previously noted. Overall I'm very happy with the progress she has made. She again did bring her Juxta-Lite although we're not quite ready for that yet. 05/11/18 upon evaluation today patient actually appears to be doing in general fairly well in regard to her left lower [place]. The swelling is very well controlled. With that being said she has a new area on the left anterior lower extremity as well as between the first and second toes of her left foot that was not present during the last evaluation. The region of her posterior left lower extremity actually appears to be almost completely healed. T be honest I'm very pleased with o the way  that stands. Nonetheless I do believe that the lotion may be keeping the area to moist as far as her legs are concerned subsequently I'm gonna consider discontinuing that today. 05/26/18 on evaluation today patient appears to be doing rather well in regard to her left lower should be ulcers. In fact everything appears to be close except for a very small area on the left posterior lower extremity. Fortunately there does not appear to be any evidence of infection at this time. Overall very pleased with her progress. 06/02/18 and evaluation today patient actually appears to be doing very well in regard to her lower extremity ulcers. She has one small area that still continues to weep that I think may benefit her being able to justify lotion and user  Juxta-Lite wraps versus continued to wrap her. Nonetheless I think this is something we can definitely look into at this point. 06/23/18 on evaluation today patient unfortunately has openings of her bilateral lower extremities. In general she seems to be doing much worse than when I last saw her just as far as her overall health standpoint is concerned. She states that her discomfort is mainly due to neuropathy she's not having any other issues otherwise. No fevers, chills, nausea, or vomiting noted at this time. 06/30/18 on evaluation today patient actually appears to be doing a little worse in regard to her right lower extremity her left lower extremity of doing fairly well. Fortunately there is no sign of infection at this time. She has been tolerating the dressing changes without complication. Home health did not come out like they were supposed to for the appropriate wrap changes. They stated that they never received the orders from [place] which were fax. Nonetheless we will send a copy of the orders with the patient today as well. 07/07/18 on evaluation today patient appears to be doing much better in regard to lower extremities. She still has several openings bilaterally although since I last saw her her legs did show obvious signs of infection when she later saw her nurse. Subsequently a culture was obtained and she is been placed on Bactrim and Keflex. Fortunately things seem to be looking much better it does appear she likely had an infection. Again last week we'd even discussed it but again there really was not any obvious sign that she had infection therefore we held off on the antibiotics. Nonetheless I'm glad she's doing better today. 07/14/18 on evaluation today patient appears to be doing much better regarding her bilateral lower Trinity's. In fact on the right lower for me there's nothing open at this point there are some dry skin areas at the sites where she had infection. Fortunately there is  no evidence of systemic infection which is excellent news. No fevers chills noted 07/21/18 on evaluation today patient actually appears to be doing much better in regard to her left lower extremity ulcers. She is making good progress and overall I feel like she's improving each time I see her. She's having no pain I do feel like the infection is completely resolved which is excellent news. No fevers, chills, nausea, or vomiting noted at this time. 07/28/18 on evaluation today patient appears to be doing very well in regard to her left lower [employer]. Everything seems to be showing signs of improvement which is excellent news. Overall very pleased with the progress that has been made. Fortunately there's no evidence of active infection at this time also excellent news. 08/04/18 on evaluation today patient appears to  be doing more poorly in regard to her bilateral lower extremities. She has two new areas open up on the right and these were completely closed as of last week. She still has the two spots on the left which in my pinion seem to be doing better. Fortunately there's no evidence of infection again at this point. 08/11/18 on evaluation today patient actually appears to be doing very well in regard to her bilateral lower Trinity wounds that all seem to be doing better and are measures smaller today. Fortunately there's no signs of infection. No fevers, chills, nausea, or vomiting noted at this time. 08/18/18 on evaluation today patient actually appears to be doing about the same inverter bilateral lower extremities. She continues to have areas that blistering open as was drain that fortunately nothing too significant. Overall I feel like Drawtex may have done better for her however compared to the collagen. 08/25/18 on evaluation today patient appears to be doing a little bit more poorly today even compared to last time I saw her. Again I'm not exactly sure why she's making worse progress over  the past several weeks. I'm beginning to wonder if there is some kind of underlying low level infection causing this issue. I did actually take a culture from the left anterior lower extremity but it was a new wound draining quite a bit at this point. Unfortunately she also seems to be having more pain which is what also makes me worried about the possibility of infection. This is despite never erythema noted at this point. 09/01/18 on evaluation today patient actually appears to be doing a little worse even compared to last week in regard to bilateral lower extremities. She did go to the hospital on the 16th was given a dose of IV Zosyn and then discharged with a recommendation to continue with the Bactrim that I previously prescribed for her. Nonetheless she is still having a lot of discomfort she tells me as well at this time. This is definitely unfortunate. No fevers, chills, nausea, or vomiting noted at this time. 09/08/18 on evaluation today patient's bilateral lower extremities actually appear to be shown signs of improvement which is good news. Fortunately there does not appear to be any signs of active infection I think the anabiotic is helping in this regard. Overall I'm very pleased with how she is progressing. 09/15/18 patient was actually seen in ER yesterday due to her legs as well unfortunately. She states she's been having a lot of pain and discomfort as well as a lot of drainage. Upon inspection today the patient does have a lot of swelling and drainage I feel like this is more related to lymphedema and poor fluid control than it is to infection based on what I'm seeing. The physician in the emergency department also doubted that the patient was having a significant infection nonetheless I see no evidence of infection obvious at this point although I do see evidence of poor fluid control. She still not using a compression pumps, she is not elevating due to her lift chair as well as her  hospital bed being broken, and she really is not keeping her legs up as much as they should be and also has been taking off her wraps. All this combined I think has led to poor fluid control and to be honest she may be somewhat volume overloaded in general as well. I recommend that she may need to contact your physician to see if a prescription for a diuretic  would be beneficial in their opinion. As long as this is safe I think it would likely help her. 09/29/18 on evaluation today patient's left lower extremity actually appears to be doing quite a bit better. At least compared to last time that I saw her. She still has a large area where she is draining from but there's a lot of new skin speckled trout and in fact there's more new skin that there are open areas of weeping and drainage at this point. This is good news. With regard to the right lower extremity this is doing much better with the only open area that I really see being a dry spot on the right lateral ankle currently. Fortunately there's no signs of active infection at this time which is good news. No fevers, chills, nausea, or vomiting noted at this time. The patient seems somewhat stressed and overwhelmed during the visit today she was very lethargic as such. She does and she is not taking any pain medications at this point. Apparently according to her husband are also in the process of moving which is probably taking its toll on her as well. 10/06/18 on evaluation today patient appears to be doing rather well in regard to her lower extremities compared to last evaluation. Fortunately there's no signs of active infection. She tells me she did have an appointment with her primary. Nonetheless he was concerned that the wounds were somewhat deep based on pictures but we never actually saw her legs. She states that he had her somewhat worried due to the fact that she was fearing now that she was [place] have to have an amputation. With that being  said based on what I'm seeing check she looks better this week that she has the last two times I've seen her with much less drainage I'm actually pleased in this regard. That doesn't mean that she's out of the water but again I do not think what the point of talking about education at all in regard to her leg. She is very happy to hear this. She is also not having as much pain as she was having last week. 10/13/18 unfortunately on evaluation today patient still continues to have a significant amount of drainage she's not letting home health actually apply the compression dressings at this point. She's trying to use of Juxta-Lite of the top of Kerlex and the second layer of the three layer compression wrap. With that being said she just does not seem to be making as good a progress as I would expect if she was having the compression applied and in place on a regular basis. No fevers, chills, nausea, or vomiting noted at this time. 10/20/18 on evaluation today patient appears to be doing a little better in regard to her bilateral lower extremity ulcers. In fact the right lower extremity seems to be healed she doesn't even have any openings at this point left lower extremity though still somewhat macerated seems to be showing signs of new skin growth at multiple locations throughout. Fortunately there's no evidence of active infection at this time. No fevers, chills, nausea, or vomiting noted at this time. 10/27/18 on evaluation today patient appears to be doing much better in regard to her left lower Trinity ulcer. She's been tolerating the laptop complication and has minimal drainage noted at this point. Fortunately there's no signs of active infection at this time. No fevers, chills, nausea, or vomiting noted at this time. 11/03/18 on evaluation today patient actually appears to  be doing excellent in regard to her left lower extremity. She is having very little drainage at this point there does not appear  to be any significant signs of infection overall very pleased with how things have gone. She is likewise extremely pleased still and seems to be making wonderful progress week to week. I do believe antibiotics were helpful for her. Her primary care provider did place on amateur clean since I last saw her. 11/17/18 on evaluation today patient appears to be doing worse in regard to her bilateral lower extremities at this point. She is been tolerating the dressing changes without complication. With that being said she typically takes the Coban off fairly quickly upon arriving home even after being seen here in the clinic and does not allow home health reapply command as part of the dressing at home. Therefore she said no compression essentially since I last saw her as best I can tell. With that being said I think it shows and how much swelling she has in the open wounds that are noted at this point. Fortunately there's no signs of infection but unfortunately if she doesn't get this under control I think she will end up with infection and more significant issues. 11/24/18 on evaluation today patient actually appears to be doing somewhat better in regard to her bilateral lower extremities. 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 Nonetheless that is doing better currently so she should be able to attempt reinitiating the lymphedema pumps at this time. No fevers, chills, nausea, or vomiting noted at this time. 12/01/18 upon evaluation today patient's left lower extremity appears to be doing quite well unfortunately her right lower extremity is not doing nearly as well. She has been tolerating the dressing changes without complication unfortunately she did not keep a wrap on the right at this time. Nonetheless I believe this has led to increased swelling and weeping in  the world is actually much larger than during the last evaluation with her. 12/08/18 on evaluation today patient appears to be doing about the same at this point in regard to her right lower extremity. There is some more palatable to touch I'm concerned about the possibility of there being some infection although I think the main issue is she's not keeping her compression wrap on which in turn is not allowing this area to heal appropriately. 12/22/18 on evaluation today patient appears to be doing better in regard to left lower extremity unfortunately significantly worse in regard to the right lower extremity. The areas of blistering and necrotic superficial tissue have spread and again this does not really appear to be signs of infection and all she just doesn't seem to be doing nearly as well is what she has been in the past. Overall I feel like the Augmentin did absolutely nothing for her she doesn't seem to have any infection again I really didn't think so last time either is more of a potential preventative measure and hoping that this would make some difference but I think the main issue is she's not wearing her compression. She tells me she cannot wear the [place] been we put on she takes it off pretty much upon getting home. Subsequently she worshiped Juxta-Lite when I questioned her about how often she wears it this is no more than three hours  a day obviously that leaves 21 hours that she has no compression and this is obviously not doing well for her. Overall I'm concerned that if things continue to worsen she is at great risk of both infection as well as losing her leg. 01/05/19 on evaluation today patient appears to be doing well in regard to her left lower extremity which he is allowing [place] to wrap and not so well with regard to her right lower extremity which she is not allowing [place] to really wrap and keep the wrap on. She states that it hurts too badly whenever it's wrapped and she ends up  having to take it off. She's been using the Juxta-Lite she tells me up to six hours a day although I question whether or not that's really been the case to be honest. Previously she told me three hours today nonetheless obviously the legs as long as the wrap is doing great when she is not is doing much more poorly. 01/12/2019 on evaluation today patient actually appears to be doing a little better in my opinion with regard to her right lower extremity ulcer. She has a small open area on the left lower extremity unfortunately but again this I think is part of the normal fluctuation of what she is going to have to expect with regard to her legs especially when she is not using her lymphedema pumps on a regular basis. Subsequently based on what I am seeing today I think that she does seem to be doing slightly better with regard to her right lower extremity she did see her primary care provider on Monday they felt she had an infection and placed her on 2 antibiotics. Both Cipro and clindamycin. Subsequently again she seems possibly to be doing a little bit better in regards to the right lower extremity she also tells me however she has been wearing the compression wrap over the past week since I spoke with her as well that is a Kerlix and Coban wrap on the right. No fevers, chills, nausea, vomiting, or diarrhea. 01/19/2019 on evaluation today patient appears to be doing better with regard to her bilateral lower extremities especially the right. I feel like the compression has been beneficial for her which is great news. She did get a call from her primary care provider on her way here today telling her that she did have methicillin- resistant Staphylococcus aureus and he was calling in a couple new antibiotics for her including a ointment to be applied she tells me 3 times a day. With that being said this sounds like likely to be Bactroban which I think could be applied with each dressing/wrap change but I  would not be able to accommodate her applying this 3 times a day. She is in agreement with the least doing this we will add that to her orders today. 01/26/2019 on evaluation today patient actually appears to be doing much better with regard to her right lower extremity. Her left lower extremity is also doing quite well all things considering. Fortunately there is no evidence of active infection at this time. No fevers, chills, nausea, vomiting, or diarrhea. 02/02/2019 on evaluation today patient appears to be doing much better compared to her last evaluation. Little by little off like her right leg is returning more towards normal. There does not appear to be any signs of active infection and overall she seems to be doing quite well which is great news. I am very pleased in this regard. No  fevers, chills, nausea, vomiting, or diarrhea. 02/09/2019 upon evaluation today patient appears to be doing better with regard to her bilateral lower extremities. She has been tolerating the dressing changes without complication. Fortunately there is no signs of active infection at this time. No fevers, chills, nausea, vomiting, or diarrhea. 02/23/2019 on evaluation today patient actually appears to be doing quite well with regard to her bilateral lower extremities. She has been tolerating the dressing changes without complication. She is even used her pumps one time and states that she really felt like it felt good. With that being said she seems to be in good spirits and her legs appear to be doing excellent. 03/09/2019 on evaluation today patient appears to be doing well with regard to her right lower extremity there are no open wounds at this time she is having some discomfort but I feel like this is more neuropathy than anything. With regard to her left lower extremity she had several areas scattered around that she does have some weeping and drainage from but again overall she does not appear to be having any  significant issues and no evidence of infection at this time which is good news. 03/23/2019 on evaluation today patient appears to be doing well with regard to her right lower extremity which she tells me is still close she is using her juxta light here. Her left lower extremity she mainly just has an area on the foot which is still slightly draining although this also is doing great. Overall very pleased at this time. 04/06/2019 patient appears to be doing a little bit worse in regard to her left lower extremity upon evaluation today. She feels like this could be becoming infected again which she had issues with previous. Fortunately there is no signs of systemic infection but again this is always a struggle with her with her legs she will go from doing well to not so well in a very short amount of time. 04/20/2019 on evaluation today patient actually appears to be doing quite well with regard to her right lower extremity I do not see any signs of active infection at this time. Fortunately there is no fever chills noted. She is still taking the antibiotics which I prescribed for her at this point. In regard to the left lower extremity I do feel like some of these areas are better although again she still is having weeping from several locations at this time. 04/27/2019 on evaluation today patient appears to be doing about the same if not slightly worse in regard to her left lower extremity ulcers. She tells me when questioned that she has been sleeping in her Hoveround chair in fact she tells me she falls asleep without even knowing it. I think she is spending a whole lot of time in the chair and less time walking and moving around which is not good for her legs either. On top of that she is in a seated position which is also the worst position she is not really elevating her legs and she is also not using her lymphedema pumps. All this is good to contribute to worsening of her condition in  general. 05/18/2019 on evaluation today patient appears to be doing well with regard to her lower extremity on the right in fact this is showing no signs of any open wounds at this time. On the left she is continuing to have issues with areas that do drain. Some of the regions have healed and there are couple areas  that have reopened. She did go to the ER per the patient according to recommendations from the home health nurse due to what she was seen when she came out on 05/13/2019. Subsequently she felt like the patient needed to go to the hospital due to the fact that again she was having "milky white discharge" from her leg. Nonetheless she had and then was placed on doxycycline and subsequently seems to be doing better. 06/01/2019 upon evaluation today patient appears to be doing really in my opinion about the same. I do not see any signs of active infection which is good news. Overall she still has wounds over the bilateral lower extremities she has reopened on the right but this appears to be more of a crack where there is weeping/edema coming from the region. I do not see any evidence of infection at either site based on what I visualized today. 07/13/2019 upon evaluation today patient appears to be doing a little worse compared to last time I saw her. She since has been in the hospital from 06/21/2019 through 06/29/2019. This was secondary to having Covid. During that time they did apply lotion to her legs which unfortunately has caused her to develop a myriad of open wounds on her lower extremities. Her legs do appear to be doing better as far as the overall appearance is concerned but nonetheless she does have more open and weeping areas. 07/27/2019 upon evaluation today patient appears to be doing more poorly to be honest in regard to her left lower extremity in particular. There is no signs of systemic infection although I do believe she may have local infection. She notes she has been having  a lot of blue/green drainage which is consistent potentially with Pseudomonas. That may be something that we need to consider here as well. The doxycycline does not seem to have been helping. 08/03/2019 upon evaluation today patient appears to be doing a little better in my opinion compared to last week's evaluation. Her culture I did review today and she is on appropriate medications to help treat the Enterobacter that was noted. Overall I feel like that is good news. With that being said she is unfortunately continuing to have a lot of drainage and though it is doing better I still think she has a long ways to go to get things dried up in general. Fortunately there is no signs of systemic infection. 08/10/2019 upon evaluation today patient appears to be doing may be slightly better in regard to her left lower extremity the right lower extremity is doing much better. Fortunately there is no signs of infection right now which is good news. No fevers, chills, nausea, vomiting, or diarrhea. 08/24/2019 on evaluation today patient appears to be doing slightly better in regard to her lower extremities. The left lower extremity seems to be healed the right lower extremity is doing better though not completely healed as far as the openings are concerned. She has some generalized issues here with edema and weeping secondary to her lymphedema though again I do believe this is little bit drier compared to prior weeks evaluations. In general I am very pleased with how things seem to be progressing. No fevers, chills, nausea, vomiting, or diarrhea. 08/31/2019 upon evaluation today patient actually seems to making some progress here with regard to the left lower extremity in particular. She has been tolerating the dressing changes without complication. Fortunately there is no signs of active infection at this time. No fevers, chills, nausea, vomiting, or  diarrhea. She did see Dr. Doren Custard and he did note that she did have  a issue with the left great saphenous vein and the small saphenous vein in the leg. With that being said he was concerned about the possibility of laser ablation not being extremely successful. He also mentioned a small risk of DVT associated with the procedure. However if the wounds do not continue to improve he stated that that would probably be the way to go. Fortunately the patient's legs do seem to be doing much better. 09/07/2019 upon evaluation today patient appears to be doing better with regard to her lower extremities. She has been tolerating the dressing changes without complication. With that being said she is showing signs of improvement and overall very pleased. There are some areas on her leg that I think we do need to debride we discussed this last week the patient is in agreement with doing that as long as it does not hurt too badly. 09/14/2019 upon evaluation today patient appears to be doing decently well with regard to her left lower extremity. She is not having near as much weeping as she has had in the past things seem to be drying up which is good news. There is no signs of active infection at this time. 09/21/19 upon evaluation today patient appears to be doing better in regard overall to her bilateral lower extremities. She again has less open than she did previous and each week I feel like this is getting better. Fortunately there is no signs of active infection at this time. No fevers, chills, nausea, vomiting, or diarrhea. 09/28/2019 upon evaluation today patient actually appears to be showing signs of improvement with regard to her left lower extremity. Unfortunately the right medial lower extremity around the ankle region has reopened to some degree but this appears to be minimal still which is good news. There is no signs of active infection at this time which is also good news. 10/12/2019 upon evaluation today patient appears to be doing okay with regard to her bilateral  lower extremities today. The right is a little bit worse then last evaluation 2 weeks ago. The left is actually doing a little better in my opinion. Overall there is no signs of active infection at this time that I see. Obviously that something we have to keep a close eye on she is very prone to this with the significant and multiple openings that she has over the bilateral lower extremities. 10/19/2019 upon evaluation today patient appears to be doing about the best that I have seen her in quite some time. She has been tolerating the dressing changes without complication. There does not appear to be any signs of active infection and overall I am extremely happy with the way her legs appeared. She is drying up quite nicely and overall is having less pain. 11/09/2019 upon evaluation today patient appears to be doing better in regard to her wounds. She seems to be drying up more and more each time I see her this is just taking a very long time. Fortunately there is no signs of active infection at this time. 11/23/2019 upon evaluation today patient actually appears to be doing excellent in regard to her lower extremities at this point compared to where she has been. Fortunately there is no signs of active infection at this time. She did go to the hospital last week for nausea and vomiting completely unrelated to her wounds. Fortunately she is doing better she was given some  Reglan and got better. She had associated abdominal pain but they never found out what was going on. 12/07/2019 upon evaluation today patient appears to be doing well for the most part in regard to her legs. She unfortunately has not been keeping the [place] portion of her wraps on therefore the compression has not really been sufficient for what it is supposed to be. Nonetheless she tells me that it just hurt too bad therefore she removed it. 12/21/2019 upon evaluation today patient actually appears to be doing quite well with regard to her  legs. I do feel like she has been making progress which is great news and overall there is no signs of active infection at this time. No fevers, chills, nausea, vomiting, or diarrhea. 01/04/2020 upon evaluation today patient presents for follow-up concerning her lower extremity edema bilaterally. She still has open wounds she has not been using her lymphedema pumps. She is also not been utilizing her compression wraps appropriately she tends to unwrap them, take them off, or states that they hurt. Obviously the reason they hurt is because her legs start to swell but the issue is if she would use her compression/lymphedema pumps regularly she would not swell and she would have the pain. Nonetheless she has not even picked them up once honestly over the past several months and may be even as much as in the past year based on my opinion and what have seen. She tells me today that after last week when I talked about this with her specifically actually that was 2 weeks ago that she "forgot". 8//21 on evaluation today patient appears to be doing a little better in regard to her legs bilaterally. Fortunately there is no signs of active infection at this time. She tells me that she used her lymphedema pumps all of one time over the past 2 weeks since I last saw her. She tells me that she has been too busy in order to continue to use these. 02/01/2020 on evaluation today patient appears to be doing some better in regard to her wounds in general in her legs. We felt the right was healed although is not completely it does appear to be doing better she tells me she has been using her lymphedema pumps that she has had this six times since I last saw her. Obviously the more she does that the better she would do my opinion 02/15/2020 upon evaluation today patient appears to be doing about the same in regard to her legs. She tells me that she is pumping I'm still not sure how much she does to be perfectly honest. However  even if she does a little bit here and there I guess that is better than nothing. Fortunately there is no sign of active infection at this time which is great news. No fevers, chills, nausea, vomiting, or diarrhea. 02/29/2020 on evaluation today patient actually appears to be doing quite well all things considered this week. She has been tolerating the dressing changes without complication. Fortunately there is no signs of active infection at this time. No fevers, chills, nausea, vomiting, or diarrhea. 03/14/2020 upon evaluation today patient appears to be doing really about the same in regard to her legs. There is no signs of improvement overall and she as noted from home health does not appear to be elevating her legs he can get into her lift chair. There is too much stuff piled up on it the patient tells me. She also tells me she cannot  really use her pumps effectively due to the fact that she cannot have any space to get them on. Finally she is also not really elevating her legs because she is not sleeping in her bed she is sleeping in her chair currently and again overall I think everything that she is done in combination has been exactly the wrong thing for what she needs for her legs. 04/04/2020 upon evaluation today patient appears to be doing well at this time with regard to her legs. She is actually been pumping, keeping her wraps on, and to be honest she seems to be doing dramatically better the right leg is excellent the left leg is also excellent and measuring much smaller than previous. 04/18/2020 upon evaluation today patient actually is continue to make good progress in regard to her lower extremities bilaterally. Everything is improving and less wet that has been in the past overall I am extremely pleased with where things stand and I think that she is making great progress. The patient tells me she still continue to use her compression pumps 05/02/2020 on evaluation today patient appears  to be doing well at this time in regard to her left leg which is showing signs of drying up. With that being said she does have a lot of lymphedema type crusty skin around the toes of her left foot and the right medial ankle which has opened at this point. Fortunately there is no signs of active infection systemically at this point or even locally for that matter. 05/23/2020 on evaluation today patient appears to be doing well with regard to her lower extremities. Fortunately there is no signs of active infection at this time. No fever chills noted. She has been very depressed however she tells me. 06/06/2020 patient came in today for evaluation in regard to her bilateral lower extremity ulcerations. With that being said she came in feeling okay and actually laughing and joking around with the staff checking her in. Subsequently however she had a coughing spell and following the coughing spell it was a dramatic conversion from being jovial and joking around to being extremely short of breath her vital signs actually dropped in regard to her blood pressure from around 175 to down around 696 for systolic and from around 96 diastolic down to around 70. With that being said she also accompanied this with an increase in her respiratory rate which was also quite significant. Nonetheless I actually upon going into see her was extremely worried we called EMS to have her transported to the ER for further evaluation and treatment. She continued until EMS got here to be extremely short of breath even on 6 L of oxygen. Her oxygen saturation did come up to 99% but overall it was only 95 before which was not terrible she did feel like the oxygen helped her feel somewhat better however. She has a history of asthma but again even the coughing spell really should not have done this degree of alteration in her demeanor from where she was just before to after the coughing spell. She tells [place] however she has been feeling  somewhat abnormal since Friday although she could not really pinpoint exactly what was going on. 06/27/2020 plan evaluation today patient appears to be doing okay in regard to her leg ulcers. She is really not showing a lot of improvement to be honest and she still has a lot of weeping. With that being said after I last saw her we called EMS to take her  to the hospital she actually did not go she went to see her primary care provider who according to the patient have not identified and read through the note states that everything checked out okay. Nonetheless the patient has continued to have bouts where she gets very short of breath for seemingly no reason whatsoever. That happened even the same night that I saw her last time. This was after she refused transport to the hospital. With that being said she also tells me that just getting dressed to come here is quite a chore which is putting on her shirt causing her to have to stop to catch her breath. Obviously this is not normal and I feel like there is something going on. She may need a referral to be seen by cardiology ASAP. She does see her primary care provider early next week she tells me 07/11/2020 upon evaluation today patient's wounds again appear to be doing about the same she mainly has weeping of the bilateral lower extremities both are very swollen today she tells me she has been using her pumps but last time I saw her she told me she did not even have her pumps out that she had "packed them away because they had too much stuff in their house. Apparently her husband has been bringing stuff in from storage units. She then subsequently told me that she had so much stuff in her house that she has been staying up all night and did not go to bed till 7:00 this morning because she was cleaning up stuff. Nonetheless I am still unsure as to whether or not she is using the pumps based on what I am seeing I would think probably not. Nonetheless she  has been keeping the compression wraps in place that is at least something 07/25/2020 upon evaluation today patient appears to be doing well currently in regard to her legs all things considered. I do not think she is doing any worse significantly although honestly I do not think she is doing a lot better either. There does not appear to be any signs of infection which is good news although she does have a lot of weeping. She is using her pumps she tells me sporadically though she says that her husband is not a big fan of helping her to get the [place]. She also tells me is difficult because in their house. It would be easiest for her to use these as where her son is actually staying with her and living out at this point. Obviously I understand that she is having a lot of issues here and to be honest I do not really know what to say in that regard and the fact that I really cannot change the situations but I do believe she really needs to be using the lymphedema pumps to try to keep things under control here. Outside of that I think she is going to continue to have significant issues. I think the pumps coupled with good compression and elevation are the only things that are to help her at this point. 08/08/2020 upon evaluation today patient appears to be doing a little worse in regard to her legs in general. I think this may be due to some infection currently. With that being said I am going to go ahead and likely see about putting her on an antibiotic. Were also can obtain a culture today where she had some purulent drainage. 08/29/2020 upon evaluation today patient appears to be doing about the  same in regard to her bilateral lower extremities. Unfortunately she is continuing to have significant issues here with edema and she has significant lymphedema. With that being said she is really not doing anything that she is supposed to be doing as far as elevation, compression, using lymphedema pumps or  anything really for that matter. She does not use her pumps, is taken the wraps off shortly after having them put on as far as home health is concerned at least by the next day she tells me, and overall is not really elevating her legs she is telling me she has a lot to do as far as cleaning up in her house. Nonetheless this triad of noncompliance is leading to worsening not improving in regard to her legs. I had a very strong conversation with her about this today again. 09/12/2020 on evaluation today patient appears to be doing poorly with regard to her lower extremities bilaterally. Fortunately there does not appear to be any signs of active infection at this point. That is systemically. Locally there is some signs of pus coming from an area on her left leg. She has been on antibiotics recently though she is done with those currently. Nonetheless she tells me she has been having increased pain with the right leg this is extremely swollen as well. Fortunately there does not appear to be significant infection of the right leg though I think her pain is really as result of the increased swelling she has been declining the wrap on the right leg and the wounds here are getting significantly worse week by week. This obviously is not will be want to see. I discussed with the patient however if she does not use the wraps, use her pumps, and elevate her legs that she is not to get better. She has been sleeping in her motorized Hoveround which does not even allow her to elevate her legs at all. She tells me that she is also constantly "cleaning up as her husband told her that he wants to move back to [place]." Unfortunately this has been the narrative for the past several months in fact that she tells me that she has been having to clean up mass that was moved in from storage units into their home and that she does not really sleep and she never really elevates her legs. I explained to the patient today  that if she is not can I do any of this that there is really nothing that I can do to help her. 09/26/20 upon evaluation today patient appears to be doing about the same in regard to her wounds. Fortunately there is no signs of active infection at this time. No fever chills noted. She has been tolerating the dressing changes without complication which is good news. With that being said I do think that she unfortunately is still having a lot of pain but I think this is due to the swelling that really is not controlled and as I discussed with her last time she needs to be using her lymphedema pumps there is still in the box she has not even attempted to use those whatsoever. I am also not even certain she is taking her fluid pills as she states she may need to have "get a refill" she was seen with her husband at the appointment today. 10/24/2020 upon evaluation today patient appears to be doing decently well in regard to her legs all things considered. She has been tolerating the dressing changes without  complication. Fortunately there is no signs of active infection at this time. No fevers, chills, nausea, vomiting, or diarrhea. With that being said the patient does appear to have some excessive thickened skin buildup currently that has been require some sharp debridement to clear this away. I am hopeful if we do this we will be able to get some of this area to dry out this otherwise trapping fluid underneath. Her son is present during the office visit today. She tells me she still been doing a lot as far as cleaning up and going through boxes she tells me she has been up for a long time already this morning. 5/25; patient presents for 2-week follow-up. She had 3 layer compression wrap placed with calcium alginate underneath. She reports tolerating the wraps well. She has home health that changes the wrap 2-3 times a week. She denies signs of infection. She has almost finished her course of antibiotics. She  uses her lymphedema pumps once weekly. Electronic Signature(s) Signed: 11/07/2020 12:19:35 PM By: Kalman Shan DO Entered By: Kalman Shan on 11/07/2020 12:18:16 -------------------------------------------------------------------------------- Physical Exam Details Patient Name: Date of Service: [name] 11/07/2020 11:00 A M Medical Record Number: [id number] Patient Account Number: [id number] Date of Birth/Sex: Treating RN: [birth year] (70 y.o. [name] Primary Care Provider: [name] Other Clinician: Referring Provider: Treating Provider/Extender: Catalina Pizza in Treatment: 170 Constitutional respirations regular, non-labored and within target range for patient.Marland Kitchen Psychiatric pleasant and cooperative. Notes Wounds on her lower extremities bilaterally with granulation tissue and with lymphedema skin changes. No signs of infection. Electronic Signature(s) Signed: 11/07/2020 12:19:35 PM By: Kalman Shan DO Entered By: Kalman Shan on 11/07/2020 12:15:07 -------------------------------------------------------------------------------- Physician Orders Details Patient Name: Date of Service: [name]. 11/07/2020 11:00 A M Medical Record Number: [id number] Patient Account Number: [id number] Date of Birth/Sex: Treating RN: [birth year] (70 y.o. [name] Primary Care Provider: [name] Other Clinician: Referring Provider: Treating Provider/Extender: Catalina Pizza in Treatment: [phone] Verbal / Phone Orders: No Diagnosis Coding ICD-10 Coding Code Description E11.622 Type 2 diabetes mellitus with other skin ulcer I89.0 Lymphedema, not elsewhere classified I87.331 Chronic venous hypertension (idiopathic) with ulcer and inflammation of right lower extremity I87.332 Chronic venous hypertension (idiopathic) with ulcer and inflammation of left lower extremity L97.812 Non-pressure chronic ulcer of  other part of right lower leg with fat layer exposed L97.822 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 Bathing/ Shower/ Hygiene May shower and wash wound with soap and water. - with dressing changes, wash both legs with wash cloth and soap and water with dressing changes Other Bathing/Shower/Hygiene Orders/Instructions: - Home health to wash legs with warn soapy water and a clean wash cloth with dressing changes Edema Control - Lymphedema / SCD / Other Bilateral Lower Extremities Lymphedema Pumps. Use Lymphedema pumps on leg(s) 2-3 times a day for 45-60 minutes. If wearing any wraps or hose, do not remove them. Continue exercising as instructed. Elevate legs to the level of the heart or above for 30 minutes daily and/or when sitting, a frequency of: - especially at night Avoid standing for long periods of time. Exercise regularly Home Health No change in wound care orders this week; continue Home Health  for wound care. May utilize formulary equivalent dressing for wound treatment orders unless otherwise specified. Dressing changes to be completed by [place] on Monday / Wednesday / Friday except when patient has scheduled visit at [facility]. Other Home Health Orders/Instructions: - [facility] Wound #61 - Lower Leg Wound Laterality: Left, Circumferential Peri-Wound Care: Sween Lotion (Moisturizing lotion) (Home Health) 3 x Per Week/30 Days Discharge Instructions: Apply moisturizing lotion to dry skin on legs Prim Dressing: Maxorb Extra Calcium Alginate 2x2 in (Home Health) 3 x Per Week/30 Days ary Discharge Instructions: Apply calcium  alginate to wound bed as instructed Secondary Dressing: ABD Pad, 8x10 (Home Health) 3 x Per Week/30 Days Discharge Instructions: Apply over primary dressing as directed. Secondary Dressing: Zetuvit Plus 4x4 in (Home Health) 3 x Per Week/30 Days Discharge Instructions: or equivalent extra absorbent pad.Apply over primary dressing as directed. Compression Wrap: ThreePress (3 layer compression wrap) (Home Health) 3 x Per Week/30 Days Discharge Instructions: Apply three layer compression as directed. Pad bend of ankle with foam or ABD pad. Wound #64 - Foot Wound Laterality: Dorsal, Left Peri-Wound Care: Sween Lotion (Moisturizing lotion) (Home Health) 3 x Per Week/30 Days Discharge Instructions: Apply moisturizing lotion to dry skin on legs Prim Dressing: Maxorb Extra Calcium Alginate 2x2 in (Home Health) 3 x Per Week/30 Days ary Discharge Instructions: Apply calcium alginate to wound bed as instructed Secondary Dressing: ABD Pad, 8x10 (Home Health) 3 x Per Week/30 Days Discharge Instructions: Apply over primary dressing as directed. Compression Wrap: ThreePress (3 layer compression wrap) (Home Health) 3 x Per Week/30 Days Discharge Instructions: Apply three layer compression as directed. Pad bend of ankle with foam or ABD pad. Wound #67 - Lower Leg Wound Laterality: Right, Medial Peri-Wound Care: Sween Lotion (Moisturizing lotion) (Home Health) 3 x Per Week/30 Days Discharge Instructions: Apply moisturizing lotion to dry skin on legs Prim Dressing: Maxorb Extra Calcium Alginate 2x2 in (Home Health) 3 x Per Week/30 Days ary Discharge Instructions: Apply calcium alginate to wound bed as instructed Secondary Dressing: ABD Pad, 8x10 (Home Health) 3 x Per Week/30 Days Discharge Instructions: Apply over primary dressing as directed. Compression Wrap: ThreePress (3 layer compression wrap) (Home Health) 3 x Per Week/30 Days Discharge Instructions: Apply three layer compression as directed. Pad bend of  ankle with foam or ABD pad. Electronic Signature(s) Signed: 11/07/2020 12:19:35 PM By: Kalman Shan DO Entered By: Kalman Shan on 11/07/2020 12:15:35 -------------------------------------------------------------------------------- Problem List Details Patient Name: Date of Service: [name]. 11/07/2020 11:00 A M Medical Record Number: [id number] Patient Account Number: [id number] Date of Birth/Sex: Treating RN: 08/19/1950 (70 y.o. [name] Primary Care Provider: [name] Other Clinician: Referring Provider: Treating Provider/Extender: Catalina Pizza in Treatment: [id number] Active Problems ICD-10 Encounter Code Description Active Date MDM Diagnosis E11.622 Type 2 diabetes mellitus with other skin ulcer 08/05/2017 No Yes I89.0 Lymphedema, not elsewhere classified 08/05/2017 No Yes I87.331 Chronic venous hypertension (idiopathic) with ulcer and inflammation of right 08/05/2017 No Yes lower extremity I87.332 Chronic venous hypertension (idiopathic) with ulcer and inflammation of left 08/05/2017 No Yes lower extremity L97.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 leg with fat layer 08/05/2017 No Yes exposed L97.822 Non-pressure chronic ulcer of other part of left lower leg with fat layer exposed2/20/2019 No Yes L97.522 Non-pressure chronic ulcer of other part of left foot with fat layer exposed 06/01/2019 No Yes I10 Essential (primary) hypertension 08/05/2017 No Yes E66.01 Morbid (severe) obesity due to excess calories  08/05/2017 No Yes F41.8 Other specified anxiety disorders 08/05/2017 No Yes R53.1 Weakness 08/05/2017 No Yes Inactive Problems Resolved Problems Electronic Signature(s) Signed: 11/07/2020 12:19:35 PM By: Kalman Shan DO Entered By: Kalman Shan on 11/07/2020 12:13:15 -------------------------------------------------------------------------------- Progress Note Details Patient Name: Date of  Service: [name]. 11/07/2020 11:00 A M Medical Record Number: [id number] Patient Account Number: [id number] Date of Birth/Sex: Treating RN: 06-May-1951 (70 y.o. [name] Primary Care Provider: [name] Other Clinician: Referring Provider: Treating Provider/Extender: Catalina Pizza in Treatment: 170 Subjective Chief Complaint Information obtained from Patient Bilateral reoccurring LE ulcers History of Present Illness (HPI) this patient has been seen a couple of times before and returns with recurrent problems to her right and left lower extremity with swelling and weeping ulcerations due to not wearing her compression stockings which she had been advised to do during her last discharge, at the end of June 2018. During her last visit the patient had had normal arterial blood flow and her venous reflux study did not necessitate any surgical intervention. She was recommended compression and elevation and wound care. After prolonged treatment the patient was completely healed but she has been noncompliant with wearing or compressions.. She was here last week with an outpatient return visit planned but the patient came in a very poor general condition with altered mental status and was rushed to the ER on my request. With a history of hypertension, diabetes, TIA and right-sided weakness she was set up for an MRI on her brain and cervical spine and was sent to [facility]. Getting an MRI done was very difficult but once the workup was done she was found not to have any spinal stenosis, epidural abscess or hematoma or discitis. This was radiculopathy to be treated as an outpatient and she was given a follow-up appointment. Today she is feeling much better alert and oriented and has come to reevaluate her bilateral lower extremity lymphedema and ulceration 03/25/2017 -- she was admitted to the hospital on 03/16/2017 and discharged on 03/18/2017 with left leg  cellulitis and ulceration. She was started on vancomycin and Zosyn and x-ray showed no bony involvement. She was treated for a cellulitis with IV antibiotics changed to Rocephin and Flagyl and was discharged on oral Keflex and doxycycline to complete a 7 day course. Last hemoglobin A1c was 7.1 and her other ailments including hypertension got asthma were appropriately treated. 05/06/2017 -- she is awaiting the right size of compression stockings from [employer] but other than that has been doing well. ====== Old notes 70 year old patient was seen one time last October and was lost to follow-up. She has recurrent problems with weeping and ulceration of her left lower extremity and has swelling of this for several years. It has been worse for the last 2 months. Past medical history is significant for diabetes mellitus type 2, hypertension, gout, morbid obesity, depressive disorders, hiatal hernia, migraines, status post knee surgery, risk of a cholecystectomy, vaginal hysterectomy and breast biopsy. She is not a smoker. As noted before she has never had a venous duplex study and an arterial ABI study was attempted but the left lower extremity was noncompressible 10/01/2016 -- had a lower extremity venous duplex reflux evaluation which showed no evidence of deep vein reflux in the right or left lower extremity, and no evidence of great saphenous vein reflux more than 500 ms in the right or left lower extremity, and the left small saphenous vein is incompetent but no vascular consult was  recommended. review of her electronic medical records noted that the ABI was checked in July 2017 where the right ABI was normal limits and the left ABI could not be ascertained due to pain with cuff pressure but the waveforms are within normal limits. her arterial duplex study scheduled for April 27. 10/08/2016 -- the patient has various reasons for not having a compression on and for the last 3 days she has had no  compression on her left lower extremity either due to pain or the lack of nursing help. She does not use her juxta lites either. 10/15/2016 -- the patient did not keep her appointment for arterial duplex study on April 27 and I have asked her to reschedule this. Her pain is out of proportion with the physical findings and she continuously fails to wear a compression wraps and cuts them off because she says she cannot tolerate the pain. She does not use her juxta lites either. 10/22/2016 -- he has rescheduled her arterial duplex study to May 21 and her pain today is a bit better. She has not been wearing her juxta lites on her right lower extremity but now understands that she needs to do this. She did tolerate the to press compression wrap on her left lower extremity 10/29/2016 --arterial duplex study is scheduled for next week and overall she has been tolerating her compression wraps and also using her juxta lites on her right lower extremity 11/05/2016 -- the right ABI was 0.95 the left was 1.03. The digit TBI is on the right was 0.83 on the left was 0.92 and she had biphasic flow through these vessels. The impression was that of normal lower extremity arterial study. 11/12/2016 -- her pain is minimal and she is doing very well overall. 11/26/2016 -- she has got juxta lites and her insurance will not pay for additional dual layer compression stockings. She is going to order some from [employer]. 05/12/2017 -- her juxta lites are very old and too big for her and these have not been helping with compression. She did get 20-30 mm compression stockings from [employer] but she and her husband are unable to put these on. I believe she will benefit from bilateral Extremit-ease, compression stockings and we will measure her for these today. 05/20/2017 -- lymphedema on the left lower extremity has increased a lot and she has a open ulceration as a result of this. The right lower extremity is looking pretty  good. She has decided to by the compression stockings herself and will get reimbursed by the home health, at a later date. 05/27/2017 -- her sciatica is bothering her a lot and she thought her left leg pain was caused due to the compression wrap and hence removed it and has significant lymphedema. There is no inflammation on this left lower extremity. 06/17/17 on evaluation today patient appears to be doing very well and in fact is completely healed in regard to her ulcerations. Unfortunately however she does have continued issues with lymphedema nonetheless. We did order compression garments for her unfortunately she states that the size that she received were large although we ordered medium. Obviously this means she is not getting the optimal compression. She does not have those with her today and therefore we could not confirm and contact the company on her behalf. Nonetheless she does state that she is going to have her husband bring them by tomorrow so that we can verify and then get in touch with the company. No fevers, chills, nausea,  or vomiting noted at this time. Overall patient is doing better otherwise and I'm pleased with the progress she has made. 07/01/17 on evaluation today patient appears to be doing very well in regard to her bilateral lower extremity she does not have any openings at this point which is excellent news. Overall I'm pleased with how things have progressed up to this time. Since she is doing so well we did order her compression which we are seeing her today to ensure that it fits her properly and everything is doing well in that regard and then subsequently she will be discharged. ============ Old Notes: 03/31/16 patient presents today for evaluation concerning open wounds that she has over the left medial ankle region as well as the left dorsal foot. She has previously had this occur although it has been healed for a number of months after having this for about a year  prior until her hospitalization on 01/05/16. At that point in time it appears that she was admitted to the hospital for left lower extremity cellulitis and was placed on vancomycin and Zosyn at that point. Eventually upon discharge on January 15, 2016 she was placed on doxycycline at that point in time. Later on 03/27/16 positive wound culture growing Escherichia coli this was switched to amoxicillin. Currently she tells me that she is having pain radiated to be a 7 out of 10 which can be as high as 10 out of 10 with palpation and manipulation of the wound. This wound appears to be mainly venous in nature due to the bilateral lower extremity venous stasis/lymphedema. This is definitely much worse on her left than the right side. She does have type 1 diabetes mellitus, hypertension, morbid obesity, and is wheelchair dependent.during the course of the hospital stay a blood culture was also obtained and fortunately appeared negative. She also had an x-ray of the tibia/fibula on the left which showed no acute bone abnormality. Her white blood cell count which was performed last on 03/25/16 was 7.3, hemoglobin 12.8, protein 7.1, albumin 3.0. Her urine culture appeared to be negative for any specific organisms. Patient did have a left lower extremity venous duplex evaluation for DVT . This did not include venous reflux studies but fortunately was negative for DVT Patient also had arterial studies performed which revealed that she had a . normal ABI on the right though this was unable to be performed on the left secondary to pain that she was having around the ankle region due to the wound. However it was stated on report that she had biphasic pulses and apparently good blood flow. ========== 06/03/17 she is here in follow-up evaluation for right lower extremity ulcer. The right lower sure he has healed but she has reopened to the left medial malleolus and dorsal foot with weeping. She is waiting for new  compression garments to arrive from home health, the previous compression garments were ill fitting. We will continue with compression bilaterally and follow-up in 2 weeks Readmission: 08/05/17 on evaluation today patient appears to be doing somewhat poorly in regard to her left lower extremity especially although the right lower extremity has a small area which may no longer be open. She has been having a lot of drainage from the left lower extremity however he tells me that she has not been able to use the EXTREMIT-EASE Compression at this point. She states that she did better and was able to actually apply the Juxta-Lite compression although the wound that she has is too  large and therefore really does not compress which is why she cannot wear it at this point. She has no one who can help her put it on regular basis her son can sometimes but he's not able to do it most of the time. I do believe that's why she has begun to weave and have issues as she is currently yet again. No fevers, chills, nausea, or vomiting noted at this time. Patient is no evidence of dementia. 08/12/17 on evaluation today patient appears to still be doing fairly well in regard to the draining areas/weeping areas at this point. With that being said she unfortunately did go to the ER yesterday due to what was felt to be possibly a cellulitis. They place her on doxycycline by mouth and discharge her home. She definitely was not admitted. With that being said she states she has had more discomfort which has been unusual for her even compared to prior times and she's had infections.08/12/17 on evaluation today patient appears to still be doing fairly well in regard to the draining areas/weeping areas at this point. With that being said she unfortunately did go to the ER yesterday due to what was felt to be possibly a cellulitis. They place her on doxycycline by mouth and discharge her home. She definitely was not admitted. With that  being said she states she has had more discomfort which has been unusual for her even compared to prior times and she's had infections. 08/19/17 put evaluation today patient tells me that she's been having a lot of what sounds to be neuropathic type pain in regard to her left lower extremity. She has been using over-the-counter topical bins again which some believe. That in order to apply the she actually remove the wrap we put on her last Wednesday on Thursday. Subsequently she has not had anything on compression wise since that time. The good news is a lot of the weeping areas appear to have closed at this point again I believe she would do better with compression but we are struggling to get her to actually use what she needs to at this point. No fevers, chills, nausea, or vomiting noted at this time. 09/03/17 on evaluation today patient appears to be doing okay in regard to her lower extremities in regard to the lymphedema and weeping. Fortunately she does not seem to show any signs of infection at this point she does have a little bit of weeping occurring in the right medial malleolus area. With that being said this does not appear to be too significant which is good news. 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 She has severe skin damage secondary to both of these features involving the dorsal left foot and medial left ankle and lower leg. Still has open areas in the left anterior foot. The area on the right closed over. She uses her own juxta light stockings. She does not have an arterial issue 09/16/17 on evaluation today patient actually appears to be doing excellent in regard to her bilateral lower extremity swelling. The Juxta-Lite compression wrap seem to be doing very well for her. She has not however been using the portion that goes over her foot. Her left foot still is draining a little bit not nearly as significant as  it has been in the past but still I do believe that she likely needs to utilize the full wrap including the foot portion of this will improve as well.  She also has been apparently putting on a significant amount of Vaseline which also think is not helpful for her. I recommended that if she feels she needs something for moisturizer Eucerin will probably be better. 09/30/17 on evaluation today patient presents with several new open areas in regard to her left lower extremity although these appear to be minimal and mainly seem to be more moisture breakdown than anything. Fortunately she does not seem to have any evidence of infection which is great news. She has been tolerating the dressing changes without complication we are using silver alginate on the foot she has been using AB pads to have the legs and using her Juxta- Lite compression which seems to be controlling her swelling very well. Overall I'm pleased with the poor way she has progressed. 10/14/17 on evaluation today patient appears to be doing better in regard to her left lower extremity areas of weeping. She does still have some discomfort although in general this does not appear to be as macerated and I think it is progressing nicely. I do think she still needs to wear the foot portion of her Juxta- Lite in order to get the most benefit from the wrap obviously. She states she understands. Fortunately there does not appear to be evidence of infection at this time which is great news. 10/28/17 on evaluation today patient appears to be doing excellent in regard to her left lower extremity. She has just a couple areas that are still open and seem to be causing any trouble whatsoever. For that reason I think that she is definitely headed in the right direction the spots are very tiny compared to what we have been dealing with in the past. 11/11/17 on evaluation today patient appears to have a right lateral lower extremity ulcer that has opened since I  last saw her. She states this is where the home health nurse that was coming out remove the dressing without wetting the alginate first. Nonetheless I do not know if this is indeed the case or not but more importantly we have not ordered home help to be coming out for her wounds at all. I'm unsure as to why they are coming out and we're gonna have to check on this and get things situated in that regard. With that being said we currently really do not need them to be coming out as the patient has been taking care of her leg herself without complication and no issues. In fact she was doing much better prior to nursing coming out. 11/25/17 on evaluation today patient actually appears to be doing fairly well in regard to her left lower extremity swelling. In fact she has very little area of weeping at this point there's just a small spot on the lateral portion of her right leg that still has me just a little bit more concerned as far as wanting to see this clear up before I discharge her to caring for this at home. Nonetheless overall she has made excellent progress. 12/09/17 on evaluation today patient appears to be doing rather well in regard to her lower extremity edema. She does have some weeping still in the left lower extremity although the big area we were taking care of two weeks ago actually has closed and she has another area of weeping on the left lower extremity immediately as well is the top of her foot. She does not currently have lymphedema pumps she has been wearing her compression daily on a regular basis as  directed. With that being said I think she may benefit from lymphedema pumps. 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 We had a very difficult time getting and keeping this under control. 12/23/17 on evaluation today patient actually  appears to be doing a little bit more poorly in regard to her bilateral lower extremities. She has been tolerating the Juxta-Lite compression wraps. Unfortunately she has two new ulcers on the right lower extremity and left lower Trinity ulceration seems to be larger. Obviously this is not good news. She has been tolerating the dressings without complication. 12/30/17 on evaluation today patient actually appears to be doing much better in regard to her bilateral lower extremity edema. She continues to have some issues with ulcerations and in fact there appears to be one spot on each leg where the wrap may have caused a little bit of a blister which is subsequently opened up at this point is given her pain. Fortunately it does not appear to be any evidence of infection which is good news. No fevers chills noted. 01/13/18 on evaluation today patient appears to be doing rather well in regard to her bilateral lower extremities. The dressings did get kind of stuck as far as the wound beds are concerned but again I think this is mainly due to the fact that she actually seems to be showing signs of healing which is good news. She's not having as much drainage therefore she was having more of the dressing sticking. Nonetheless overall I feel like her swelling is dramatically down compared to previous. 01/20/18 on evaluation today patient unfortunately though she's doing better in most regards has a large blister on the left anterior lower extremity where she is draining quite significantly. Subsequently this is going to need debridement today in order to see what's underneath and ensure she does not continue to trapping fluid at this location. Nonetheless No fevers, chills, nausea, or vomiting noted at this time. 01/27/18 on evaluation today patient appears to be doing rather well at this point in regard to her right lower extremity there's just a very small area that she still has open at this point. With that  being said I do believe that she is tolerating the compression wraps very well in making good progress. Home health is coming out at this point to see her. Her left lower extremity on the lateral portion is actually what still mainly open and causing her some discomfort for the most part 02/10/18 on evaluation today patient actually appears to be doing very well in regard to her right lower extremity were all the ulcers appear to be completely close. In regard to the left lower extremity she does have two areas still open and some leaking from the dorsal surface of her foot but this still seems to be doing much better to me in general. 02/24/18 on evaluation today patient actually appears to be doing much better in regard to her right lower extremity this is still completely healed. Her left lower extremity is also doing much better fortunately she has no evidence of infection. The one area that is gonna require some debridement is still on the left anterior shin. Fortunately this is not hurting her as badly today. 03/10/18 on evaluation today patient appears to be doing better in some regards although she has a little bit more open  area on the dorsal foot and she also has some issues on the medial portion of the left lower extremity which is actually new and somewhat deep. With that being said there fortunately does not appear to be any significant signs of infection which is good news. No fevers, chills, nausea, or vomiting noted at this time. In general her swelling seems to be doing fairly well which is good news. 03/31/18 on evaluation today patient presents for follow-up concerning her left lower extremity lymphedema. Unfortunately she has been doing a little bit more poorly since I last saw her in regard to the amount of weeping that she is experiencing. She's also having some increased pain in the anterior shin location. Unfortunately I do not feel like the patient is making such good progress at  this point a few weeks back she was definitely doing much better. 04/07/18 on evaluation today patient actually appears to be showing some signs of improvement as far as the left lower extremity is concerned. She has been tolerating the dressing changes and it does appear that the Drawtex did better for her. With that being said unfortunately home health is stating that they cannot obtain the Drawtex going forward. Nonetheless we're gonna have to check and see what they may be able to get the alginate they were using was getting stuck in causing new areas of skin being pulled all that with and subsequently weep and calls her to worsen overall this is the first time we've seen improvement at this time. 04/14/18 on evaluation today patient actually appears to be doing rather well at this point there does not appear to be any evidence of infection at this time and she is actually doing excellent in regard to the weeping in fact she almost has no openings remaining even compared to just last week this is a dramatic improvement. No fevers chills noted 04/21/18 evaluation today patient actually appears to be doing very well. She in fact is has a small area on the posterior lower extremity location and she has a small area on the dorsal surface of her foot that are still open both of which are very close to closing. We're hoping this will be close shortly. She brought her Juxta-Lite wrap with her today hoping that would be able to put her in it unfortunately I don't think were quite at that point yet but we're getting closer. 04/28/18 upon evaluation today patient actually appears to be doing excellent in regard to her left lower extremity ulcer. In fact the region on the posterior lower extremity actually is much smaller than previously noted. Overall I'm very happy with the progress she has made. She again did bring her Juxta-Lite although we're not quite ready for that yet. 05/11/18 upon evaluation today  patient actually appears to be doing in general fairly well in regard to her left lower Trinity. The swelling is very well controlled. With that being said she has a new area on the left anterior lower extremity as well as between the first and second toes of her left foot that was not present during the last evaluation. The region of her posterior left lower extremity actually appears to be almost completely healed. T be honest I'm very pleased with o the way that stands. Nonetheless I do believe that the lotion may be keeping the area to moist as far as her legs are concerned subsequently I'm gonna consider discontinuing that today. 05/26/18 on evaluation today patient appears to be doing rather  well in regard to her left lower should be ulcers. In fact everything appears to be close except for a very small area on the left posterior lower extremity. Fortunately there does not appear to be any evidence of infection at this time. Overall very pleased with her progress. 06/02/18 and evaluation today patient actually appears to be doing very well in regard to her lower extremity ulcers. She has one small area that still continues to weep that I think may benefit her being able to justify lotion and user Juxta-Lite wraps versus continued to wrap her. Nonetheless I think this is something we can definitely look into at this point. 06/23/18 on evaluation today patient unfortunately has openings of her bilateral lower extremities. In general she seems to be doing much worse than when I last saw her just as far as her overall health standpoint is concerned. She states that her discomfort is mainly due to neuropathy she's not having any other issues otherwise. No fevers, chills, nausea, or vomiting noted at this time. 06/30/18 on evaluation today patient actually appears to be doing a little worse in regard to her right lower extremity her left lower extremity of doing fairly well. Fortunately there is no sign  of infection at this time. She has been tolerating the dressing changes without complication. Home health did not come out like they were supposed to for the appropriate wrap changes. They stated that they never received the orders from [place] which were fax. Nonetheless we will send a copy of the orders with the patient today as well. 07/07/18 on evaluation today patient appears to be doing much better in regard to lower extremities. She still has several openings bilaterally although since I last saw her her legs did show obvious signs of infection when she later saw her nurse. Subsequently a culture was obtained and she is been placed on Bactrim and Keflex. Fortunately things seem to be looking much better it does appear she likely had an infection. Again last week we'd even discussed it but again there really was not any obvious sign that she had infection therefore we held off on the antibiotics. Nonetheless I'm glad she's doing better today. 07/14/18 on evaluation today patient appears to be doing much better regarding her bilateral lower Trinity's. In fact on the right lower for me there's nothing open at this point there are some dry skin areas at the sites where she had infection. Fortunately there is no evidence of systemic infection which is excellent news. No fevers chills noted 07/21/18 on evaluation today patient actually appears to be doing much better in regard to her left lower extremity ulcers. She is making good progress and overall I feel like she's improving each time I see her. She's having no pain I do feel like the infection is completely resolved which is excellent news. No fevers, chills, nausea, or vomiting noted at this time. 07/28/18 on evaluation today patient appears to be doing very well in regard to her left lower [employer]. Everything seems to be showing signs of improvement which is excellent news. Overall very pleased with the progress that has been made. Fortunately  there's no evidence of active infection at this time also excellent news. 08/04/18 on evaluation today patient appears to be doing more poorly in regard to her bilateral lower extremities. She has two new areas open up on the right and these were completely closed as of last week. She still has the two spots on the left which  in my pinion seem to be doing better. Fortunately there's no evidence of infection again at this point. 08/11/18 on evaluation today patient actually appears to be doing very well in regard to her bilateral lower Trinity wounds that all seem to be doing better and are measures smaller today. Fortunately there's no signs of infection. No fevers, chills, nausea, or vomiting noted at this time. 08/18/18 on evaluation today patient actually appears to be doing about the same inverter bilateral lower extremities. She continues to have areas that blistering open as was drain that fortunately nothing too significant. Overall I feel like Drawtex may have done better for her however compared to the collagen. 08/25/18 on evaluation today patient appears to be doing a little bit more poorly today even compared to last time I saw her. Again I'm not exactly sure why she's making worse progress over the past several weeks. I'm beginning to wonder if there is some kind of underlying low level infection causing this issue. I did actually take a culture from the left anterior lower extremity but it was a new wound draining quite a bit at this point. Unfortunately she also seems to be having more pain which is what also makes me worried about the possibility of infection. This is despite never erythema noted at this point. 09/01/18 on evaluation today patient actually appears to be doing a little worse even compared to last week in regard to bilateral lower extremities. She did go to the hospital on the 16th was given a dose of IV Zosyn and then discharged with a recommendation to continue with the  Bactrim that I previously prescribed for her. Nonetheless she is still having a lot of discomfort she tells me as well at this time. This is definitely unfortunate. No fevers, chills, nausea, or vomiting noted at this time. 09/08/18 on evaluation today patient's bilateral lower extremities actually appear to be shown signs of improvement which is good news. Fortunately there does not appear to be any signs of active infection I think the anabiotic is helping in this regard. Overall I'm very pleased with how she is progressing. 09/15/18 patient was actually seen in ER yesterday due to her legs as well unfortunately. She states she's been having a lot of pain and discomfort as well as a lot of drainage. Upon inspection today the patient does have a lot of swelling and drainage I feel like this is more related to lymphedema and poor fluid control than it is to infection based on what I'm seeing. The physician in the emergency department also doubted that the patient was having a significant infection nonetheless I see no evidence of infection obvious at this point although I do see evidence of poor fluid control. She still not using a compression pumps, she is not elevating due to her lift chair as well as her hospital bed being broken, and she really is not keeping her legs up as much as they should be and also has been taking off her wraps. All this combined I think has led to poor fluid control and to be honest she may be somewhat volume overloaded in general as well. I recommend that she may need to contact your physician to see if a prescription for a diuretic would be beneficial in their opinion. As long as this is safe I think it would likely help her. 09/29/18 on evaluation today patient's left lower extremity actually appears to be doing quite a bit better. At least compared to last  time that I saw her. She still has a large area where she is draining from but there's a lot of new skin speckled trout  and in fact there's more new skin that there are open areas of weeping and drainage at this point. This is good news. With regard to the right lower extremity this is doing much better with the only open area that I really see being a dry spot on the right lateral ankle currently. Fortunately there's no signs of active infection at this time which is good news. No fevers, chills, nausea, or vomiting noted at this time. The patient seems somewhat stressed and overwhelmed during the visit today she was very lethargic as such. She does and she is not taking any pain medications at this point. Apparently according to her husband are also in the process of moving which is probably taking its toll on her as well. 10/06/18 on evaluation today patient appears to be doing rather well in regard to her lower extremities compared to last evaluation. Fortunately there's no signs of active infection. She tells me she did have an appointment with her primary. Nonetheless he was concerned that the wounds were somewhat deep based on pictures but we never actually saw her legs. She states that he had her somewhat worried due to the fact that she was fearing now that she was [place] have to have an amputation. With that being said based on what I'm seeing check she looks better this week that she has the last two times I've seen her with much less drainage I'm actually pleased in this regard. That doesn't mean that she's out of the water but again I do not think what the point of talking about education at all in regard to her leg. She is very happy to hear this. She is also not having as much pain as she was having last week. 10/13/18 unfortunately on evaluation today patient still continues to have a significant amount of drainage she's not letting home health actually apply the compression dressings at this point. She's trying to use of Juxta-Lite of the top of Kerlex and the second layer of the three layer compression  wrap. With that being said she just does not seem to be making as good a progress as I would expect if she was having the compression applied and in place on a regular basis. No fevers, chills, nausea, or vomiting noted at this time. 10/20/18 on evaluation today patient appears to be doing a little better in regard to her bilateral lower extremity ulcers. In fact the right lower extremity seems to be healed she doesn't even have any openings at this point left lower extremity though still somewhat macerated seems to be showing signs of new skin growth at multiple locations throughout. Fortunately there's no evidence of active infection at this time. No fevers, chills, nausea, or vomiting noted at this time. 10/27/18 on evaluation today patient appears to be doing much better in regard to her left lower Trinity ulcer. She's been tolerating the laptop complication and has minimal drainage noted at this point. Fortunately there's no signs of active infection at this time. No fevers, chills, nausea, or vomiting noted at this time. 11/03/18 on evaluation today patient actually appears to be doing excellent in regard to her left lower extremity. She is having very little drainage at this point there does not appear to be any significant signs of infection overall very pleased with how things have gone. She is  likewise extremely pleased still and seems to be making wonderful progress week to week. I do believe antibiotics were helpful for her. Her primary care provider did place on amateur clean since I last saw her. 11/17/18 on evaluation today patient appears to be doing worse in regard to her bilateral lower extremities at this point. She is been tolerating the dressing changes without complication. With that being said she typically takes the Coban off fairly quickly upon arriving home even after being seen here in the clinic and does not allow home health reapply command as part of the dressing at home.  Therefore she said no compression essentially since I last saw her as best I can tell. With that being said I think it shows and how much swelling she has in the open wounds that are noted at this point. Fortunately there's no signs of infection but unfortunately if she doesn't get this under control I think she will end up with infection and more significant issues. 11/24/18 on evaluation today patient actually appears to be doing somewhat better in regard to her bilateral lower extremities. 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 Nonetheless that is doing better currently so she should be able to attempt reinitiating the lymphedema pumps at this time. No fevers, chills, nausea, or vomiting noted at this time. 12/01/18 upon evaluation today patient's left lower extremity appears to be doing quite well unfortunately her right lower extremity is not doing nearly as well. She has been tolerating the dressing changes without complication unfortunately she did not keep a wrap on the right at this time. Nonetheless I believe this has led to increased swelling and weeping in the world is actually much larger than during the last evaluation with her. 12/08/18 on evaluation today patient appears to be doing about the same at this point in regard to her right lower extremity. There is some more palatable to touch I'm concerned about the possibility of there being some infection although I think the main issue is she's not keeping her compression wrap on which in turn is not allowing this area to heal appropriately. 12/22/18 on evaluation today patient appears to be doing better in regard to left lower extremity unfortunately significantly worse in regard to the right lower extremity. The areas of blistering and necrotic superficial tissue have spread and again this does  not really appear to be signs of infection and all she just doesn't seem to be doing nearly as well is what she has been in the past. Overall I feel like the Augmentin did absolutely nothing for her she doesn't seem to have any infection again I really didn't think so last time either is more of a potential preventative measure and hoping that this would make some difference but I think the main issue is she's not wearing her compression. She tells me she cannot wear the [place] been we put on she takes it off pretty much upon getting home. Subsequently she worshiped Juxta-Lite when I questioned her about how often she wears it this is no more than three hours a day obviously that leaves 21 hours that she has no compression and this is obviously not doing well for her. Overall I'm concerned that if things continue to worsen she is at great risk of both infection as well  as losing her leg. 01/05/19 on evaluation today patient appears to be doing well in regard to her left lower extremity which he is allowing [place] to wrap and not so well with regard to her right lower extremity which she is not allowing [place] to really wrap and keep the wrap on. She states that it hurts too badly whenever it's wrapped and she ends up having to take it off. She's been using the Juxta-Lite she tells me up to six hours a day although I question whether or not that's really been the case to be honest. Previously she told me three hours today nonetheless obviously the legs as long as the wrap is doing great when she is not is doing much more poorly. 01/12/2019 on evaluation today patient actually appears to be doing a little better in my opinion with regard to her right lower extremity ulcer. She has a small open area on the left lower extremity unfortunately but again this I think is part of the normal fluctuation of what she is going to have to expect with regard to her legs especially when she is not using her lymphedema pumps on  a regular basis. Subsequently based on what I am seeing today I think that she does seem to be doing slightly better with regard to her right lower extremity she did see her primary care provider on Monday they felt she had an infection and placed her on 2 antibiotics. Both Cipro and clindamycin. Subsequently again she seems possibly to be doing a little bit better in regards to the right lower extremity she also tells me however she has been wearing the compression wrap over the past week since I spoke with her as well that is a Kerlix and Coban wrap on the right. No fevers, chills, nausea, vomiting, or diarrhea. 01/19/2019 on evaluation today patient appears to be doing better with regard to her bilateral lower extremities especially the right. I feel like the compression has been beneficial for her which is great news. She did get a call from her primary care provider on her way here today telling her that she did have methicillin- resistant Staphylococcus aureus and he was calling in a couple new antibiotics for her including a ointment to be applied she tells me 3 times a day. With that being said this sounds like likely to be Bactroban which I think could be applied with each dressing/wrap change but I would not be able to accommodate her applying this 3 times a day. She is in agreement with the least doing this we will add that to her orders today. 01/26/2019 on evaluation today patient actually appears to be doing much better with regard to her right lower extremity. Her left lower extremity is also doing quite well all things considering. Fortunately there is no evidence of active infection at this time. No fevers, chills, nausea, vomiting, or diarrhea. 02/02/2019 on evaluation today patient appears to be doing much better compared to her last evaluation. Little by little off like her right leg is returning more towards normal. There does not appear to be any signs of active infection and overall  she seems to be doing quite well which is great news. I am very pleased in this regard. No fevers, chills, nausea, vomiting, or diarrhea. 02/09/2019 upon evaluation today patient appears to be doing better with regard to her bilateral lower extremities. She has been tolerating the dressing changes without complication. Fortunately there is no signs of active infection  at this time. No fevers, chills, nausea, vomiting, or diarrhea. 02/23/2019 on evaluation today patient actually appears to be doing quite well with regard to her bilateral lower extremities. She has been tolerating the dressing changes without complication. She is even used her pumps one time and states that she really felt like it felt good. With that being said she seems to be in good spirits and her legs appear to be doing excellent. 03/09/2019 on evaluation today patient appears to be doing well with regard to her right lower extremity there are no open wounds at this time she is having some discomfort but I feel like this is more neuropathy than anything. With regard to her left lower extremity she had several areas scattered around that she does have some weeping and drainage from but again overall she does not appear to be having any significant issues and no evidence of infection at this time which is good news. 03/23/2019 on evaluation today patient appears to be doing well with regard to her right lower extremity which she tells me is still close she is using her juxta light here. Her left lower extremity she mainly just has an area on the foot which is still slightly draining although this also is doing great. Overall very pleased at this time. 04/06/2019 patient appears to be doing a little bit worse in regard to her left lower extremity upon evaluation today. She feels like this could be becoming infected again which she had issues with previous. Fortunately there is no signs of systemic infection but again this is always a  struggle with her with her legs she will go from doing well to not so well in a very short amount of time. 04/20/2019 on evaluation today patient actually appears to be doing quite well with regard to her right lower extremity I do not see any signs of active infection at this time. Fortunately there is no fever chills noted. She is still taking the antibiotics which I prescribed for her at this point. In regard to the left lower extremity I do feel like some of these areas are better although again she still is having weeping from several locations at this time. 04/27/2019 on evaluation today patient appears to be doing about the same if not slightly worse in regard to her left lower extremity ulcers. She tells me when questioned that she has been sleeping in her Hoveround chair in fact she tells me she falls asleep without even knowing it. I think she is spending a whole lot of time in the chair and less time walking and moving around which is not good for her legs either. On top of that she is in a seated position which is also the worst position she is not really elevating her legs and she is also not using her lymphedema pumps. All this is good to contribute to worsening of her condition in general. 05/18/2019 on evaluation today patient appears to be doing well with regard to her lower extremity on the right in fact this is showing no signs of any open wounds at this time. On the left she is continuing to have issues with areas that do drain. Some of the regions have healed and there are couple areas that have reopened. She did go to the ER per the patient according to recommendations from the home health nurse due to what she was seen when she came out on 05/13/2019. Subsequently she felt like the patient needed to go  to the hospital due to the fact that again she was having "milky white discharge" from her leg. Nonetheless she had and then was placed on doxycycline and subsequently seems to be  doing better. 06/01/2019 upon evaluation today patient appears to be doing really in my opinion about the same. I do not see any signs of active infection which is good news. Overall she still has wounds over the bilateral lower extremities she has reopened on the right but this appears to be more of a crack where there is weeping/edema coming from the region. I do not see any evidence of infection at either site based on what I visualized today. 07/13/2019 upon evaluation today patient appears to be doing a little worse compared to last time I saw her. She since has been in the hospital from 06/21/2019 through 06/29/2019. This was secondary to having Covid. During that time they did apply lotion to her legs which unfortunately has caused her to develop a myriad of open wounds on her lower extremities. Her legs do appear to be doing better as far as the overall appearance is concerned but nonetheless she does have more open and weeping areas. 07/27/2019 upon evaluation today patient appears to be doing more poorly to be honest in regard to her left lower extremity in particular. There is no signs of systemic infection although I do believe she may have local infection. She notes she has been having a lot of blue/green drainage which is consistent potentially with Pseudomonas. That may be something that we need to consider here as well. The doxycycline does not seem to have been helping. 08/03/2019 upon evaluation today patient appears to be doing a little better in my opinion compared to last week's evaluation. Her culture I did review today and she is on appropriate medications to help treat the Enterobacter that was noted. Overall I feel like that is good news. With that being said she is unfortunately continuing to have a lot of drainage and though it is doing better I still think she has a long ways to go to get things dried up in general. Fortunately there is no signs of systemic  infection. 08/10/2019 upon evaluation today patient appears to be doing may be slightly better in regard to her left lower extremity the right lower extremity is doing much better. Fortunately there is no signs of infection right now which is good news. No fevers, chills, nausea, vomiting, or diarrhea. 08/24/2019 on evaluation today patient appears to be doing slightly better in regard to her lower extremities. The left lower extremity seems to be healed the right lower extremity is doing better though not completely healed as far as the openings are concerned. She has some generalized issues here with edema and weeping secondary to her lymphedema though again I do believe this is little bit drier compared to prior weeks evaluations. In general I am very pleased with how things seem to be progressing. No fevers, chills, nausea, vomiting, or diarrhea. 08/31/2019 upon evaluation today patient actually seems to making some progress here with regard to the left lower extremity in particular. She has been tolerating the dressing changes without complication. Fortunately there is no signs of active infection at this time. No fevers, chills, nausea, vomiting, or diarrhea. She did see Dr. Doren Custard and he did note that she did have a issue with the left great saphenous vein and the small saphenous vein in the leg. With that being said he was concerned about the possibility  of laser ablation not being extremely successful. He also mentioned a small risk of DVT associated with the procedure. However if the wounds do not continue to improve he stated that that would probably be the way to go. Fortunately the patient's legs do seem to be doing much better. 09/07/2019 upon evaluation today patient appears to be doing better with regard to her lower extremities. She has been tolerating the dressing changes without complication. With that being said she is showing signs of improvement and overall very pleased. There are some  areas on her leg that I think we do need to debride we discussed this last week the patient is in agreement with doing that as long as it does not hurt too badly. 09/14/2019 upon evaluation today patient appears to be doing decently well with regard to her left lower extremity. She is not having near as much weeping as she has had in the past things seem to be drying up which is good news. There is no signs of active infection at this time. 09/21/19 upon evaluation today patient appears to be doing better in regard overall to her bilateral lower extremities. She again has less open than she did previous and each week I feel like this is getting better. Fortunately there is no signs of active infection at this time. No fevers, chills, nausea, vomiting, or diarrhea. 09/28/2019 upon evaluation today patient actually appears to be showing signs of improvement with regard to her left lower extremity. Unfortunately the right medial lower extremity around the ankle region has reopened to some degree but this appears to be minimal still which is good news. There is no signs of active infection at this time which is also good news. 10/12/2019 upon evaluation today patient appears to be doing okay with regard to her bilateral lower extremities today. The right is a little bit worse then last evaluation 2 weeks ago. The left is actually doing a little better in my opinion. Overall there is no signs of active infection at this time that I see. Obviously that something we have to keep a close eye on she is very prone to this with the significant and multiple openings that she has over the bilateral lower extremities. 10/19/2019 upon evaluation today patient appears to be doing about the best that I have seen her in quite some time. She has been tolerating the dressing changes without complication. There does not appear to be any signs of active infection and overall I am extremely happy with the way her legs appeared.  She is drying up quite nicely and overall is having less pain. 11/09/2019 upon evaluation today patient appears to be doing better in regard to her wounds. She seems to be drying up more and more each time I see her this is just taking a very long time. Fortunately there is no signs of active infection at this time. 11/23/2019 upon evaluation today patient actually appears to be doing excellent in regard to her lower extremities at this point compared to where she has been. Fortunately there is no signs of active infection at this time. She did go to the hospital last week for nausea and vomiting completely unrelated to her wounds. Fortunately she is doing better she was given some Reglan and got better. She had associated abdominal pain but they never found out what was going on. 12/07/2019 upon evaluation today patient appears to be doing well for the most part in regard to her legs. She unfortunately has  not been keeping the [place] portion of her wraps on therefore the compression has not really been sufficient for what it is supposed to be. Nonetheless she tells me that it just hurt too bad therefore she removed it. 12/21/2019 upon evaluation today patient actually appears to be doing quite well with regard to her legs. I do feel like she has been making progress which is great news and overall there is no signs of active infection at this time. No fevers, chills, nausea, vomiting, or diarrhea. 01/04/2020 upon evaluation today patient presents for follow-up concerning her lower extremity edema bilaterally. She still has open wounds she has not been using her lymphedema pumps. She is also not been utilizing her compression wraps appropriately she tends to unwrap them, take them off, or states that they hurt. Obviously the reason they hurt is because her legs start to swell but the issue is if she would use her compression/lymphedema pumps regularly she would not swell and she would have the pain.  Nonetheless she has not even picked them up once honestly over the past several months and may be even as much as in the past year based on my opinion and what have seen. She tells me today that after last week when I talked about this with her specifically actually that was 2 weeks ago that she "forgot". 8//21 on evaluation today patient appears to be doing a little better in regard to her legs bilaterally. Fortunately there is no signs of active infection at this time. She tells me that she used her lymphedema pumps all of one time over the past 2 weeks since I last saw her. She tells me that she has been too busy in order to continue to use these. 02/01/2020 on evaluation today patient appears to be doing some better in regard to her wounds in general in her legs. We felt the right was healed although is not completely it does appear to be doing better she tells me she has been using her lymphedema pumps that she has had this six times since I last saw her. Obviously the more she does that the better she would do my opinion 02/15/2020 upon evaluation today patient appears to be doing about the same in regard to her legs. She tells me that she is pumping I'm still not sure how much she does to be perfectly honest. However even if she does a little bit here and there I guess that is better than nothing. Fortunately there is no sign of active infection at this time which is great news. No fevers, chills, nausea, vomiting, or diarrhea. 02/29/2020 on evaluation today patient actually appears to be doing quite well all things considered this week. She has been tolerating the dressing changes without complication. Fortunately there is no signs of active infection at this time. No fevers, chills, nausea, vomiting, or diarrhea. 03/14/2020 upon evaluation today patient appears to be doing really about the same in regard to her legs. There is no signs of improvement overall and she as noted from home health does  not appear to be elevating her legs he can get into her lift chair. There is too much stuff piled up on it the patient tells me. She also tells me she cannot really use her pumps effectively due to the fact that she cannot have any space to get them on. Finally she is also not really elevating her legs because she is not sleeping in her bed she is sleeping in  her chair currently and again overall I think everything that she is done in combination has been exactly the wrong thing for what she needs for her legs. 04/04/2020 upon evaluation today patient appears to be doing well at this time with regard to her legs. She is actually been pumping, keeping her wraps on, and to be honest she seems to be doing dramatically better the right leg is excellent the left leg is also excellent and measuring much smaller than previous. 04/18/2020 upon evaluation today patient actually is continue to make good progress in regard to her lower extremities bilaterally. Everything is improving and less wet that has been in the past overall I am extremely pleased with where things stand and I think that she is making great progress. The patient tells me she still continue to use her compression pumps 05/02/2020 on evaluation today patient appears to be doing well at this time in regard to her left leg which is showing signs of drying up. With that being said she does have a lot of lymphedema type crusty skin around the toes of her left foot and the right medial ankle which has opened at this point. Fortunately there is no signs of active infection systemically at this point or even locally for that matter. 05/23/2020 on evaluation today patient appears to be doing well with regard to her lower extremities. Fortunately there is no signs of active infection at this time. No fever chills noted. She has been very depressed however she tells me. 06/06/2020 patient came in today for evaluation in regard to her bilateral lower  extremity ulcerations. With that being said she came in feeling okay and actually laughing and joking around with the staff checking her in. Subsequently however she had a coughing spell and following the coughing spell it was a dramatic conversion from being jovial and joking around to being extremely short of breath her vital signs actually dropped in regard to her blood pressure from around 175 to down around 782 for systolic and from around 96 diastolic down to around 70. With that being said she also accompanied this with an increase in her respiratory rate which was also quite significant. Nonetheless I actually upon going into see her was extremely worried we called EMS to have her transported to the ER for further evaluation and treatment. She continued until EMS got here to be extremely short of breath even on 6 L of oxygen. Her oxygen saturation did come up to 99% but overall it was only 95 before which was not terrible she did feel like the oxygen helped her feel somewhat better however. She has a history of asthma but again even the coughing spell really should not have done this degree of alteration in her demeanor from where she was just before to after the coughing spell. She tells [place] however she has been feeling somewhat abnormal since Friday although she could not really pinpoint exactly what was going on. 06/27/2020 plan evaluation today patient appears to be doing okay in regard to her leg ulcers. She is really not showing a lot of improvement to be honest and she still has a lot of weeping. With that being said after I last saw her we called EMS to take her to the hospital she actually did not go she went to see her primary care provider who according to the patient have not identified and read through the note states that everything checked out okay. Nonetheless the patient has continued to  have bouts where she gets very short of breath for seemingly no reason whatsoever. That  happened even the same night that I saw her last time. This was after she refused transport to the hospital. With that being said she also tells me that just getting dressed to come here is quite a chore which is putting on her shirt causing her to have to stop to catch her breath. Obviously this is not normal and I feel like there is something going on. She may need a referral to be seen by cardiology ASAP. She does see her primary care provider early next week she tells me 07/11/2020 upon evaluation today patient's wounds again appear to be doing about the same she mainly has weeping of the bilateral lower extremities both are very swollen today she tells me she has been using her pumps but last time I saw her she told me she did not even have her pumps out that she had "packed them away because they had too much stuff in their house. Apparently her husband has been bringing stuff in from storage units. She then subsequently told me that she had so much stuff in her house that she has been staying up all night and did not go to bed till 7:00 this morning because she was cleaning up stuff. Nonetheless I am still unsure as to whether or not she is using the pumps based on what I am seeing I would think probably not. Nonetheless she has been keeping the compression wraps in place that is at least something 07/25/2020 upon evaluation today patient appears to be doing well currently in regard to her legs all things considered. I do not think she is doing any worse significantly although honestly I do not think she is doing a lot better either. There does not appear to be any signs of infection which is good news although she does have a lot of weeping. She is using her pumps she tells me sporadically though she says that her husband is not a big fan of helping her to get the [place]. She also tells me is difficult because in their house. It would be easiest for her to use these as where her son is actually  staying with her and living out at this point. Obviously I understand that she is having a lot of issues here and to be honest I do not really know what to say in that regard and the fact that I really cannot change the situations but I do believe she really needs to be using the lymphedema pumps to try to keep things under control here. Outside of that I think she is going to continue to have significant issues. I think the pumps coupled with good compression and elevation are the only things that are to help her at this point. 08/08/2020 upon evaluation today patient appears to be doing a little worse in regard to her legs in general. I think this may be due to some infection currently. With that being said I am going to go ahead and likely see about putting her on an antibiotic. Were also can obtain a culture today where she had some purulent drainage. 08/29/2020 upon evaluation today patient appears to be doing about the same in regard to her bilateral lower extremities. Unfortunately she is continuing to have significant issues here with edema and she has significant lymphedema. With that being said she is really not doing anything that she is supposed to be  doing as far as elevation, compression, using lymphedema pumps or anything really for that matter. She does not use her pumps, is taken the wraps off shortly after having them put on as far as home health is concerned at least by the next day she tells me, and overall is not really elevating her legs she is telling me she has a lot to do as far as cleaning up in her house. Nonetheless this triad of noncompliance is leading to worsening not improving in regard to her legs. I had a very strong conversation with her about this today again. 09/12/2020 on evaluation today patient appears to be doing poorly with regard to her lower extremities bilaterally. Fortunately there does not appear to be any signs of active infection at this point. That is  systemically. Locally there is some signs of pus coming from an area on her left leg. She has been on antibiotics recently though she is done with those currently. Nonetheless she tells me she has been having increased pain with the right leg this is extremely swollen as well. Fortunately there does not appear to be significant infection of the right leg though I think her pain is really as result of the increased swelling she has been declining the wrap on the right leg and the wounds here are getting significantly worse week by week. This obviously is not will be want to see. I discussed with the patient however if she does not use the wraps, use her pumps, and elevate her legs that she is not to get better. She has been sleeping in her motorized Hoveround which does not even allow her to elevate her legs at all. She tells me that she is also constantly "cleaning up as her husband told her that he wants to move back to [place]." Unfortunately this has been the narrative for the past several months in fact that she tells me that she has been having to clean up mass that was moved in from storage units into their home and that she does not really sleep and she never really elevates her legs. I explained to the patient today that if she is not can I do any of this that there is really nothing that I can do to help her. 09/26/20 upon evaluation today patient appears to be doing about the same in regard to her wounds. Fortunately there is no signs of active infection at this time. No fever chills noted. She has been tolerating the dressing changes without complication which is good news. With that being said I do think that she unfortunately is still having a lot of pain but I think this is due to the swelling that really is not controlled and as I discussed with her last time she needs to be using her lymphedema pumps there is still in the box she has not even attempted to use those whatsoever. I am  also not even certain she is taking her fluid pills as she states she may need to have "get a refill" she was seen with her husband at the appointment today. 10/24/2020 upon evaluation today patient appears to be doing decently well in regard to her legs all things considered. She has been tolerating the dressing changes without complication. Fortunately there is no signs of active infection at this time. No fevers, chills, nausea, vomiting, or diarrhea. With that being said the patient does appear to have some excessive thickened skin buildup currently that has been require some  sharp debridement to clear this away. I am hopeful if we do this we will be able to get some of this area to dry out this otherwise trapping fluid underneath. Her son is present during the office visit today. She tells me she still been doing a lot as far as cleaning up and going through boxes she tells me she has been up for a long time already this morning. 5/25; patient presents for 2-week follow-up. She had 3 layer compression wrap placed with calcium alginate underneath. She reports tolerating the wraps well. She has home health that changes the wrap 2-3 times a week. She denies signs of infection. She has almost finished her course of antibiotics. She uses her lymphedema pumps once weekly. Patient History Information obtained from Patient. Family History Cancer - Siblings, Hypertension - Siblings, Stroke - Father, No family history of Diabetes, Heart Disease, Hereditary Spherocytosis, Kidney Disease, Lung Disease, Seizures, Thyroid Problems, Tuberculosis. Social History Never smoker, Marital Status - Married, Alcohol Use - Never, Drug Use - No History, Caffeine Use - Never. Medical History Eyes Denies history of Cataracts, Glaucoma, Optic Neuritis Ear/Nose/Mouth/Throat Denies history of Chronic sinus problems/congestion, Middle ear problems Hematologic/Lymphatic Denies history of Anemia, Hemophilia, Human  Immunodeficiency Virus, Lymphedema, Sickle Cell Disease Respiratory Patient has history of Asthma Denies history of Aspiration, Chronic Obstructive Pulmonary Disease (COPD), Pneumothorax, Sleep Apnea, Tuberculosis Cardiovascular Patient has history of Hypertension, Peripheral Arterial Disease, Peripheral Venous Disease - chronic Denies history of Angina, Arrhythmia, Congestive Heart Failure, Coronary Artery Disease, Deep Vein Thrombosis, Hypotension, Myocardial Infarction, Phlebitis, Vasculitis Endocrine Patient has history of Type II Diabetes Genitourinary Denies history of End Stage Renal Disease Immunological Denies history of Lupus Erythematosus, Raynaudoos, Scleroderma Musculoskeletal Patient has history of Gout, Osteoarthritis Denies history of Rheumatoid Arthritis, Osteomyelitis Neurologic Denies history of Dementia, Neuropathy, Quadriplegia, Paraplegia, Seizure Disorder Psychiatric Denies history of Anorexia/bulimia, Confinement Anxiety Hospitalization/Surgery History - leg cellulitis. Medical A Surgical History Notes nd Constitutional Symptoms (General Health) morbid obesity, wheelchair dependent Integumentary (Skin) LLL cellulitis , wound cx grew E. Coli Psychiatric insomnia , adjustment disorder, depression with anxiety Objective Constitutional respirations regular, non-labored and within target range for patient.. Vitals Time Taken: 11:44 AM, Height: 62 in, Weight: 335 lbs, BMI: 61.3, Temperature: 98.2 F, Pulse: 94 bpm, Respiratory Rate: 18 breaths/min, Blood Pressure: 143/79 mmHg, Capillary Blood Glucose: 160 mg/dl. General Notes: glucose per pt report Psychiatric pleasant and cooperative. General Notes: Wounds on her lower extremities bilaterally with granulation tissue and with lymphedema skin changes. No signs of infection. Integumentary (Hair, Skin) Wound #61 status is Open. Original cause of wound was Gradually Appeared. The date acquired was: 04/20/2019.  The wound has been in treatment 81 weeks. The wound is located on the Left,Circumferential Lower Leg. The wound measures 13.5cm length x 32cm width x 0.1cm depth; 339.292cm^2 area and 33.929cm^3 volume. There is Fat Layer (Subcutaneous Tissue) exposed. There is no tunneling or undermining noted. There is a medium amount of serous drainage noted. The wound margin is indistinct and nonvisible. There is large (67-100%) pink, pale granulation within the wound bed. There is no necrotic tissue within the wound bed. Wound #64 status is Open. Original cause of wound was Gradually Appeared. The date acquired was: 06/01/2019. The wound has been in treatment 75 weeks. The wound is located on the Left,Dorsal Foot. The wound measures 9cm length x 9.5cm width x 0.1cm depth; 67.152cm^2 area and 6.715cm^3 volume. There is Fat Layer (Subcutaneous Tissue) exposed. There is no tunneling or undermining noted. There  is a medium amount of serous drainage noted. The wound margin is indistinct and nonvisible. There is large (67-100%) pink, pale granulation within the wound bed. There is no necrotic tissue within the wound bed. Wound #67 status is Open. Original cause of wound was Gradually Appeared. The date acquired was: 05/02/2020. The wound has been in treatment 27 weeks. The wound is located on the Right,Medial Lower Leg. The wound measures 2.7cm length x 4.2cm width x 0.1cm depth; 8.906cm^2 area and 0.891cm^3 volume. There is Fat Layer (Subcutaneous Tissue) exposed. There is no tunneling or undermining noted. There is a medium amount of serosanguineous drainage noted. The wound margin is indistinct and nonvisible. There is large (67-100%) pink, pale granulation within the wound bed. There is no necrotic tissue within the wound bed. Wound #69 status is Healed - Epithelialized. Original cause of wound was Gradually Appeared. The date acquired was: 07/25/2020. The wound has been in treatment 15 weeks. The wound is located on  the Right,Dorsal Foot. The wound measures 0cm length x 0cm width x 0cm depth; 0cm^2 area and 0cm^3 volume. There is no tunneling or undermining noted. There is a none present amount of drainage noted. The wound margin is flat and intact. There is no granulation within the wound bed. There is no necrotic tissue within the wound bed. Wound #70 status is Healed - Epithelialized. Original cause of wound was Gradually Appeared. The date acquired was: 08/29/2020. The wound has been in treatment 10 weeks. The wound is located on the Right,Anterior Lower Leg. The wound measures 0cm length x 0cm width x 0cm depth; 0cm^2 area and 0cm^3 volume. There is no tunneling or undermining noted. There is a none present amount of drainage noted. The wound margin is flat and intact. There is no granulation within the wound bed. There is no necrotic tissue within the wound bed. 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 She had a wound culture done at last clinic visit that was sensitive to Ciprofloxacin. She is almost done with this course. I mechanically debrided the wounds today. There were no signs of infections. I recommended using the pumps more often. She will follow-up with Margarita Grizzle in 2 weeks Procedures Wound #61 Pre-procedure diagnosis of Wound #61 is a Venous Leg  Ulcer located on the Left,Circumferential Lower Leg . There was a Three Layer Compression Therapy Procedure by Rhae Hammock, RN. Post procedure Diagnosis Wound #61: Same as Pre-Procedure Wound #67 Pre-procedure diagnosis of Wound #67 is a Diabetic Wound/Ulcer of the Lower Extremity located on the Right,Medial Lower Leg . There was a Three Layer Compression Therapy Procedure by Rhae Hammock, RN. Post procedure Diagnosis Wound #67: Same as Pre-Procedure Plan Follow-up Appointments: Return Appointment in 2 weeks. Bathing/ Shower/ Hygiene: May shower and wash wound with soap and water. - with dressing changes, wash both legs with wash cloth and soap and water with dressing changes Other Bathing/Shower/Hygiene Orders/Instructions: - Home health to wash legs with warn soapy  water and a clean wash cloth with dressing changes Edema Control - Lymphedema / SCD / Other: Lymphedema Pumps. Use Lymphedema pumps on leg(s) 2-3 times a day for 45-60 minutes. If wearing any wraps or hose, do not remove them. Continue exercising as instructed. Elevate legs to the level of the heart or above for 30 minutes daily and/or when sitting, a frequency of: - especially at night Avoid standing for long periods of time. Exercise regularly Home Health: No change in wound care orders this week; continue Home Health for wound care. May utilize formulary equivalent dressing for wound treatment orders unless otherwise specified. Dressing changes to be completed by [place] on Monday / Wednesday / Friday except when patient has scheduled visit at [facility]. Other Home Health Orders/Instructions: - [facility] WOUND #61: - Lower Leg Wound Laterality: Left, Circumferential Peri-Wound Care: Sween Lotion (Moisturizing lotion) (Home Health) 3 x Per Week/30 Days Discharge Instructions: Apply moisturizing lotion to dry skin on legs Prim Dressing: Maxorb Extra Calcium Alginate 2x2 in (Home Health) 3 x Per Week/30  Days ary Discharge Instructions: Apply calcium alginate to wound bed as instructed Secondary Dressing: ABD Pad, 8x10 (Home Health) 3 x Per Week/30 Days Discharge Instructions: Apply over primary dressing as directed. Secondary Dressing: Zetuvit Plus 4x4 in (Home Health) 3 x Per Week/30 Days Discharge Instructions: or equivalent extra absorbent pad.Apply over primary dressing as directed. Com pression Wrap: ThreePress (3 layer compression wrap) (Home Health) 3 x Per Week/30 Days Discharge Instructions: Apply three layer compression as directed. Pad bend of ankle with foam or ABD pad. WOUND #64: - Foot Wound Laterality: Dorsal, Left Peri-Wound Care: Sween Lotion (Moisturizing lotion) (Home Health) 3 x Per Week/30 Days Discharge Instructions: Apply moisturizing lotion to dry skin on legs Prim Dressing: Maxorb Extra Calcium Alginate 2x2 in (Home Health) 3 x Per Week/30 Days ary Discharge Instructions: Apply calcium alginate to wound bed as instructed Secondary Dressing: ABD Pad, 8x10 (Home Health) 3 x Per Week/30 Days Discharge Instructions: Apply over primary dressing as directed. Com pression Wrap: ThreePress (3 layer compression wrap) (Home Health) 3 x Per Week/30 Days Discharge Instructions: Apply three layer compression as directed. Pad bend of ankle with foam or ABD pad. WOUND #67: - Lower Leg Wound Laterality: Right, Medial Peri-Wound Care: Sween Lotion (Moisturizing lotion) (Home Health) 3 x Per Week/30 Days Discharge Instructions: Apply moisturizing lotion to dry skin on legs Prim Dressing: Maxorb Extra Calcium Alginate 2x2 in (Home Health) 3 x Per Week/30 Days ary Discharge Instructions: Apply calcium alginate to wound bed as instructed Secondary Dressing: ABD Pad, 8x10 (Home Health) 3 x Per Week/30 Days Discharge Instructions: Apply over primary dressing as directed. Com pression Wrap: ThreePress (3 layer compression wrap) (Home Health) 3 x Per Week/30 Days Discharge Instructions:  Apply three layer compression as directed. Pad bend of ankle with foam or ABD pad. 1. Calcium alginate under 3 layer compression 2. Follow-up in 2 weeks Electronic Signature(s) Signed: 11/07/2020 12:19:35 PM By: Kalman Shan DO Entered By: Kalman Shan on 11/07/2020 12:18:50 -------------------------------------------------------------------------------- HxROS Details Patient Name: Date of Service: [name]. 11/07/2020 11:00 A M Medical Record Number: [id number] Patient Account Number: [id number] Date of Birth/Sex: Treating RN: 07/06/1950 (70 y.o. [name] Primary Care Provider: [name] Other Clinician: Referring Provider: Treating Provider/Extender: Catalina Pizza in Treatment: 170 Information Obtained From Patient Constitutional Symptoms (General Health) Medical History: Past Medical History Notes: morbid obesity, wheelchair dependent Eyes Medical History: Negative for: Cataracts; Glaucoma; Optic  Neuritis Ear/Nose/Mouth/Throat Medical History: Negative for: Chronic sinus problems/congestion; Middle ear problems Hematologic/Lymphatic Medical History: Negative for: Anemia; Hemophilia; Human Immunodeficiency Virus; Lymphedema; Sickle Cell Disease Respiratory Medical History: Positive for: Asthma Negative for: Aspiration; Chronic Obstructive Pulmonary Disease (COPD); Pneumothorax; Sleep Apnea; Tuberculosis Cardiovascular Medical History: Positive for: Hypertension; Peripheral Arterial Disease; Peripheral Venous Disease - chronic Negative for: Angina; Arrhythmia; Congestive Heart Failure; Coronary Artery Disease; Deep Vein Thrombosis; Hypotension; Myocardial Infarction; Phlebitis; Vasculitis Endocrine Medical History: Positive for: Type II Diabetes Time with diabetes: 2005 Treated with: Insulin Blood sugar tested every day: No Blood sugar testing results: Breakfast: 155; Lunch: 155; Dinner: 160; Bedtime:  155 Genitourinary Medical History: Negative for: End Stage Renal Disease Immunological Medical History: Negative for: Lupus Erythematosus; Raynauds; Scleroderma Integumentary (Skin) Medical History: Past Medical History Notes: LLL cellulitis , wound cx grew E. Coli Musculoskeletal Medical History: Positive for: Gout; Osteoarthritis Negative for: Rheumatoid Arthritis; Osteomyelitis Neurologic Medical History: Negative for: Dementia; Neuropathy; Quadriplegia; Paraplegia; Seizure Disorder Psychiatric Medical History: Negative for: Anorexia/bulimia; Confinement Anxiety Past Medical History Notes: insomnia , adjustment disorder, depression with anxiety Immunizations Pneumococcal Vaccine: Received Pneumococcal Vaccination: Yes Implantable Devices No devices added Hospitalization / Surgery History Type of Hospitalization/Surgery leg cellulitis Family and Social History Cancer: Yes - Siblings; Diabetes: No; Heart Disease: No; Hereditary Spherocytosis: No; Hypertension: Yes - Siblings; Kidney Disease: No; Lung Disease: No; Seizures: No; Stroke: Yes - Father; Thyroid Problems: No; Tuberculosis: No; Never smoker; Marital Status - Married; Alcohol Use: Never; Drug Use: No History; Caffeine Use: Never; Financial Concerns: No; Food, Clothing or Shelter Needs: No; Support System Lacking: No; Transportation Concerns: No Electronic Signature(s) Signed: 11/07/2020 12:19:35 PM By: Kalman Shan DO Signed: 11/07/2020 4:28:15 PM By: [name] RN, BSN Entered By: Kalman Shan on 11/07/2020 12:14:29 -------------------------------------------------------------------------------- [place] Details Patient Name: Date of Service: [name] 11/07/2020 Medical Record Number: [id number] Patient Account Number: [id number] Date of Birth/Sex: Treating RN: 12-01-1950 (70 y.o. [name] Primary Care Provider: [name] Other Clinician: Referring Provider: Treating  Provider/Extender: Catalina Pizza in Treatment: 170 Diagnosis Coding ICD-10 Codes Code Description E11.622 Type 2 diabetes mellitus with other skin ulcer I89.0 Lymphedema, not elsewhere classified I87.331 Chronic venous hypertension (idiopathic) with ulcer and inflammation of right lower extremity I87.332 Chronic venous hypertension (idiopathic) with ulcer and inflammation of left lower extremity L97.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 leg with fat layer exposed L97.822 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 Physician Procedures : CPT4 Code Description Modifier [id number] - WC PHYS LEVEL 3 - EST PT ICD-10 Diagnosis Description E11.622 Type 2 diabetes mellitus with other skin ulcer I89.0 Lymphedema, not elsewhere classified I87.331 Chronic venous hypertension (idiopathic) with  ulcer and inflammation of right lower extremity I87.332 Chronic venous hypertension (idiopathic) with ulcer and inflammation of left lower extremity Quantity: 1 Electronic Signature(s) Signed: 11/07/2020 12:19:35 PM By: Kalman Shan DO Entered By: Kalman Shan on 11/07/2020 12:19:06

## 2020-11-13 NOTE — Progress Notes (Signed)
RUT, BETTERTON ([id number]) Visit Report for 11/07/2020 Arrival Information Details Patient Name: Date of Service: [name], [name] 11/07/2020 11:00 A M Medical Record Number: [id number] Patient Account Number: [id number] Date of Birth/Sex: Treating RN: [birth year] (70 y.o. [name] Primary Care Georgene Kopper: [name] Other Clinician: Referring Scottlyn Mchaney: Treating Lashana Spang/Extender: Catalina Pizza in Treatment: 56 Visit Information History Since Last Visit Added or deleted any medications: No Patient Arrived: Wheel Chair Any new allergies or adverse reactions: No Arrival Time: 11:44 Had a fall or experienced change in No Accompanied By: alone activities of daily living that may affect Transfer Assistance: None risk of falls: Patient Identification Verified: Yes Signs or symptoms of abuse/neglect since last visito No Secondary Verification Process Completed: Yes Hospitalized since last visit: No Patient Requires Transmission-Based Precautions: No Implantable device outside of the clinic excluding No Patient Has Alerts: Yes cellular tissue based products placed in the center Patient Alerts: R ABI= 1.01 since last visit: L ABI = .99 Has Dressing in Place as Prescribed: Yes Has Compression in Place as Prescribed: Yes Pain Present Now: Yes Electronic Signature(s) Signed: 11/07/2020 4:40:56 PM By: Levan Hurst RN, BSN Entered By: Levan Hurst on 11/07/2020 11:44:28 -------------------------------------------------------------------------------- Compression Therapy Details Patient Name: Date of Service: [name] Duke. 11/07/2020 11:00 A M Medical Record Number: [id number] Patient Account Number: [id number] Date of Birth/Sex: Treating RN: [birth year] (70 y.o. [name] Primary Care Siddharth Babington: [name] Other Clinician: Referring Inola Lisle: Treating Zaliah Wissner/Extender: Catalina Pizza in Treatment: 170 Compression  Therapy Performed for Wound Assessment: Wound #61 Left,Circumferential Lower Leg Performed By: Clinician Rhae Hammock, RN Compression Type: Three Layer Post Procedure Diagnosis Same as Pre-procedure Electronic Signature(s) Signed: 11/07/2020 4:28:15 PM By: [name] RN, BSN Entered By: [name] on 11/07/2020 12:09:35 -------------------------------------------------------------------------------- Compression Therapy Details Patient Name: Date of Service: [name] Duke. 11/07/2020 11:00 A M Medical Record Number: [id number] Patient Account Number: [id number] Date of Birth/Sex: Treating RN: [birth year] (70 y.o. [name] Primary Care Desree Leap: [name] Other Clinician: Referring Rhemi Balbach: Treating Syrita Dovel/Extender: Catalina Pizza in Treatment: 170 Compression Therapy Performed for Wound Assessment: Wound #67 Right,Medial Lower Leg Performed By: Clinician Rhae Hammock, RN Compression Type: Three Layer Post Procedure Diagnosis Same as Pre-procedure Electronic Signature(s) Signed: 11/07/2020 4:28:15 PM By: [name] RN, BSN Entered By: [name] on 11/07/2020 12:09:35 -------------------------------------------------------------------------------- Encounter Discharge Information Details Patient Name: Date of Service: [name] Duke. 11/07/2020 11:00 A M Medical Record Number: [id number] Patient Account Number: [id number] Date of Birth/Sex: Treating RN: 04/14/1951 (70 y.o. [name], [name] Primary Care Arvel Oquinn: [name] Other Clinician: Referring Shaft Corigliano: Treating Adelaido Nicklaus/Extender: Catalina Pizza in Treatment: [phone] Encounter Discharge Information Items Discharge Condition: Stable Ambulatory Status: Wheelchair Discharge Destination: Home Transportation: Private Auto Accompanied By: self Schedule Follow-up Appointment: Yes Clinical Summary of Care: Electronic Signature(s) Signed:  11/07/2020 4:06:25 PM By: Deon Pilling Entered By: Deon Pilling on 11/07/2020 13:52:51 -------------------------------------------------------------------------------- Lower Extremity Assessment Details Patient Name: Date of Service: [name], [name] 11/07/2020 11:00 A M Medical Record Number: [id number] Patient Account Number: [id number] Date of Birth/Sex: Treating RN: Feb 28, 1951 (70 y.o. [name] Primary Care Shivon Hackel: [name] Other Clinician: Referring Lofton Leon: Treating Kamorie Aldous/Extender: Catalina Pizza in Treatment: 170 Edema Assessment Assessed: [name]: No] [Right: No] Edema: [Left: Yes] [Right: Yes] Calf Left: Right: Point of Measurement: 37 cm From Medial Instep 36 cm 46 cm Ankle Left: Right: Point of Measurement: 10 cm From Medial Instep 30 cm  25 cm Vascular Assessment Pulses: Dorsalis Pedis Palpable: [Left:Yes] [Right:Yes] Electronic Signature(s) Signed: 11/07/2020 4:40:56 PM By: Levan Hurst RN, BSN Entered By: Levan Hurst on 11/07/2020 11:45:47 -------------------------------------------------------------------------------- Multi Wound Chart Details Patient Name: Date of Service: [name] Duke. 11/07/2020 11:00 A M Medical Record Number: [id number] Patient Account Number: [id number] Date of Birth/Sex: Treating RN: 06-05-1951 (70 y.o. Martyn Malay, Linda Primary Care Dominie Benedick: [name] Other Clinician: Referring Preciliano Castell: Treating Mailey Landstrom/Extender: Catalina Pizza in Treatment: 170 Vital Signs Height(in): 90 Capillary Blood Glucose(mg/dl): 160 Weight(lbs): 335 Pulse(bpm): 35 Body Mass Index(BMI): 32 Blood Pressure(mmHg): 143/79 Temperature(F): 98.2 Respiratory Rate(breaths/min): 18 Photos: [61:No Photos Left, Circumferential Lower Leg] [64:No Photos Left, Dorsal Foot] [67:No Photos Right, Medial Lower Leg] Wound Location: [61:Gradually Appeared] [64:Gradually Appeared] [67:Gradually  Appeared] Wounding Event: [61:Venous Leg Ulcer] [64:Diabetic Wound/Ulcer of the Lower] [67:Diabetic Wound/Ulcer of the Lower] Primary Etiology: [61:N/A] [64:Extremity N/A] [67:Extremity N/A] Secondary Etiology: [61:Asthma, Hypertension, Peripheral] [64:Asthma, Hypertension, Peripheral] [67:Asthma, Hypertension, Peripheral] Comorbid History: [61:Arterial Disease, Peripheral Venous Disease, Type II Diabetes, Gout, Osteoarthritis 04/20/2019] [64:Arterial Disease, Peripheral Venous Disease, Type II Diabetes, Gout, Osteoarthritis 06/01/2019] [67:Arterial Disease, Peripheral  Venous Disease, Type II Diabetes, Gout, Osteoarthritis 05/02/2020] Date Acquired: [61:81] [64:75] [67:27] Weeks of Treatment: [61:Open] [64:Open] [67:Open] Wound Status: [61:Yes] [64:No] [67:No] Clustered Wound: [61:13.5x32x0.1] [64:9x9.5x0.1] [67:2.7x4.2x0.1] Measurements L x W x D (cm) [61:339.292] [64:67.152] [67:8.906] A (cm) : rea [13:08.657] [64:6.715] [67:0.891] Volume (cm) : [61:-4399.90%] [64:-40598.20%] [67:-529.80%] % Reduction in Area: [61:-4399.90%] [64:-13604.10%] [67:-531.90%] % Reduction in Volume: [61:Full Thickness Without Exposed] [64:Grade 1] [67:Grade 1] Classification: [61:Support Structures Medium] [64:Medium] [67:Medium] Exudate Amount: [61:Serous] [64:Serous] [67:Serosanguineous] Exudate Type: [61:amber] [64:amber] [67:red, brown] Exudate Color: [61:Indistinct, nonvisible] [64:Indistinct, nonvisible] [67:Indistinct, nonvisible] Wound Margin: [61:Large (67-100%)] [64:Large (67-100%)] [67:Large (67-100%)] Granulation Amount: [61:Pink, Pale] [64:Pink, Pale] [67:Pink, Pale] Granulation Quality: [61:None Present (0%)] [64:None Present (0%)] [67:None Present (0%)] Necrotic Amount: [61:Fat Layer (Subcutaneous Tissue): Yes Fat Layer (Subcutaneous Tissue): Yes Fat Layer (Subcutaneous Tissue): Yes] Exposed Structures: [61:Fascia: No Tendon: No Muscle: No Joint: No Bone: No None] [64:Fascia: No Tendon: No  Muscle: No Joint: No Bone: No Small (1-33%)] [67:Fascia: No Tendon: No Muscle: No Joint: No Bone: No Small (1-33%)] Epithelialization: [61:Compression Therapy] [64:N/A] [67:Compression Piney Wound Number: [id number] 70 N/A Photos: No Photos No Photos N/A Right, Dorsal Foot Right, Anterior Lower Leg N/A Wound Location: Gradually Appeared Gradually Appeared N/A Wounding Event: Lymphedema Venous Leg Ulcer N/A Primary Etiology: Diabetic Wound/Ulcer of the Lower N/A N/A Secondary Etiology: Extremity Asthma, Hypertension, Peripheral Asthma, Hypertension, Peripheral N/A Comorbid History: Arterial Disease, Peripheral Venous Arterial Disease, Peripheral Venous Disease, Type II Diabetes, Gout, Disease, Type II Diabetes, Gout, Osteoarthritis Osteoarthritis 07/25/2020 08/29/2020 N/A Date Acquired: 15 10 N/A Weeks of Treatment: Healed - Epithelialized Healed - Epithelialized N/A Wound Status: No No N/A Clustered Wound: 0x0x0 0x0x0 N/A Measurements L x W x D (cm) 0 0 N/A A (cm) : rea 0 0 N/A Volume (cm) : 100.00% 100.00% N/A % Reduction in Area: 100.00% 100.00% N/A % Reduction in Volume: Full Thickness Without Exposed Full Thickness Without Exposed N/A Classification: Support Structures Support Structures None Present None Present N/A Exudate Amount: N/A N/A N/A Exudate Type: N/A N/A N/A Exudate Color: Flat and Intact Flat and Intact N/A Wound Margin: None Present (0%) None Present (0%) N/A Granulation Amount: N/A N/A N/A Granulation Quality: None Present (0%) None Present (0%) N/A Necrotic Amount: Fascia: No Fascia: No N/A Exposed Structures: Fat Layer (Subcutaneous Tissue): No Fat Layer (Subcutaneous Tissue): No Tendon: No Tendon: No Muscle:  No Muscle: No Joint: No Joint: No Bone: No Bone: No Large (67-100%) Large (67-100%) N/A Epithelialization: N/A N/A N/A Procedures Performed: Treatment Notes Electronic Signature(s) Signed: 11/07/2020 12:19:35 PM By: Kalman Shan DO Signed: 11/07/2020 4:28:15 PM By: [name] RN, BSN Entered By: Kalman Shan on 11/07/2020 12:13:22 -------------------------------------------------------------------------------- Multi-Disciplinary Care Plan Details Patient Name: Date of Service: [name] Duke. 11/07/2020 11:00 A M Medical Record Number: [id number] Patient Account Number: [id number] Date of Birth/Sex: Treating RN: 1950-07-10 (70 y.o. [name] Primary Care Rutledge Selsor: [name] Other Clinician: Referring Martha Ellerby: Treating Huldah Marin/Extender: Catalina Pizza in Treatment: [place] reviewed with physician Active Inactive Venous Leg Ulcer Nursing Diagnoses: Actual venous Insuffiency (use after diagnosis is confirmed) Knowledge deficit related to disease process and management Goals: Patient will maintain optimal edema control Date Initiated: 08/12/2017 Target Resolution Date: 11/21/2020 Goal Status: Active Patient/caregiver will verbalize understanding of disease process and disease management Date Initiated: 08/12/2017 Date Inactivated: 04/21/2018 Target Resolution Date: 04/24/2018 Goal Status: Met Interventions: Assess peripheral edema status every visit. Compression as ordered Treatment Activities: Therapeutic compression applied : 08/12/2017 Notes: Wound/Skin Impairment Nursing Diagnoses: Impaired tissue integrity Knowledge deficit related to ulceration/compromised skin integrity Goals: Patient/caregiver will verbalize understanding of skin care regimen Date Initiated: 08/12/2017 Target Resolution Date: 11/21/2020 Goal Status: Active Ulcer/skin breakdown will have a volume reduction of 30% by week 4 Date Initiated: 08/05/2017 Date Inactivated: 09/30/2017 Target Resolution Date: 10/03/2017 Goal Status: Met Ulcer/skin breakdown will have a volume reduction of 50% by week 8 Date Initiated: 09/30/2017 Date Inactivated: 10/28/2017 Target  Resolution Date: 10/28/2017 Goal Status: Met Interventions: Assess patient/caregiver ability to perform ulcer/skin care regimen upon admission and as needed Assess ulceration(s) every visit Provide education on ulcer and skin care Screen for HBO Treatment Activities: Patient referred to home care : 08/05/2017 Skin care regimen initiated : 08/05/2017 Topical wound management initiated : 08/05/2017 Notes: Electronic Signature(s) Signed: 11/07/2020 4:28:15 PM By: [name] RN, BSN Entered By: [name] on 11/07/2020 12:13:06 -------------------------------------------------------------------------------- Pain Assessment Details Patient Name: Date of Service: [name] Duke. 11/07/2020 11:00 A M Medical Record Number: [id number] Patient Account Number: [id number] Date of Birth/Sex: Treating RN: 03/09/1951 (70 y.o. [name] Primary Care Lorry Anastasi: [name] Other Clinician: Referring Jaree Trinka: Treating Tobie Hellen/Extender: Catalina Pizza in Treatment: 170 Active Problems Location of Pain Severity and Description of Pain Patient Has Paino Yes Site Locations Site Locations Pain Location: Pain in Ulcers With Dressing Change: Yes Rate the pain. Current Pain Level: 6 Character of Pain Describe the Pain: Burning Pain Management and Medication Current Pain Management: Medication: Yes Cold Application: No Rest: No Massage: No Activity: No T.E.N.S.: No Heat Application: No Leg drop or elevation: No Is the Current Pain Management Adequate: Adequate How does your wound impact your activities of daily livingo Sleep: No Bathing: No Appetite: No Relationship With Others: No Bladder Continence: No Emotions: No Bowel Continence: No Work: No Toileting: No Drive: No Dressing: No Hobbies: No Engineer, maintenance) Signed: 11/07/2020 4:40:56 PM By: Levan Hurst RN, BSN Entered By: Levan Hurst on 11/07/2020  11:46:50 -------------------------------------------------------------------------------- Patient/Caregiver Education Details Patient Name: Date of Service: [name], [name] 5/25/2022andnbsp11:00 A M Medical Record Number: [id number] Patient Account Number: [id number] Date of Birth/Gender: Treating RN: [birth year] (70 y.o. [name] Primary Care Physician: [name] Other Clinician: Referring Physician: Treating Physician/Extender: Catalina Pizza in Treatment: [phone] Education Assessment Education Provided To: Patient Education Topics Provided Venous: Methods: Explain/Verbal Responses: Reinforcements needed,  State content correctly Wound/Skin Impairment: Methods: Explain/Verbal Responses: Reinforcements needed, State content correctly Electronic Signature(s) Signed: 11/07/2020 4:28:15 PM By: [name] RN, BSN Entered By: [name] on 11/07/2020 12:13:36 -------------------------------------------------------------------------------- Wound Assessment Details Patient Name: Date of Service: [name] Duke. 11/07/2020 11:00 A M Medical Record Number: [id number] Patient Account Number: [id number] Date of Birth/Sex: Treating RN: [birth year] (70 y.o. [name] Primary Care Kambrea Carrasco: [name] Other Clinician: Referring Khai Torbert: Treating Perline Awe/Extender: Catalina Pizza in Treatment: 170 Wound Status Wound Number: 61 Primary Venous Leg Ulcer Etiology: Wound Location: Left, Circumferential Lower Leg Wound Open Wounding Event: Gradually Appeared Status: Date Acquired: 04/20/2019 Comorbid Asthma, Hypertension, Peripheral Arterial Disease, Peripheral Weeks Of Treatment: 81 History: Venous Disease, Type II Diabetes, Gout, Osteoarthritis Clustered Wound: Yes Photos Wound Measurements Length: (cm) 13.5 Width: (cm) 32 Depth: (cm) 0.1 Area: (cm) 339.292 Volume: (cm) 33.929 % Reduction in Area:  -4399.9% % Reduction in Volume: -4399.9% Epithelialization: None Tunneling: No Undermining: No Wound Description Classification: Full Thickness Without Exposed Support Structures Wound Margin: Indistinct, nonvisible Exudate Amount: Medium Exudate Type: Serous Exudate Color: amber Foul Odor After Cleansing: No Slough/Fibrino No Wound Bed Granulation Amount: Large (67-100%) Exposed Structure Granulation Quality: Pink, Pale Fascia Exposed: No Necrotic Amount: None Present (0%) Fat Layer (Subcutaneous Tissue) Exposed: Yes Tendon Exposed: No Muscle Exposed: No Joint Exposed: No Bone Exposed: No Treatment Notes Wound #61 (Lower Leg) Wound Laterality: Left, Circumferential Cleanser Peri-Wound Care Sween Lotion (Moisturizing lotion) Discharge Instruction: Apply moisturizing lotion to dry skin on legs Topical Primary Dressing Maxorb Extra Calcium Alginate 2x2 in Discharge Instruction: Apply calcium alginate to wound bed as instructed Secondary Dressing ABD Pad, 8x10 Discharge Instruction: Apply over primary dressing as directed. Zetuvit Plus 4x4 in Discharge Instruction: or equivalent extra absorbent pad.Apply over primary dressing as directed. Secured With Compression Wrap ThreePress (3 layer compression wrap) Discharge Instruction: Apply three layer compression as directed. Pad bend of ankle with foam or ABD pad. Compression Stockings Add-Ons Electronic Signature(s) Signed: 11/07/2020 4:40:56 PM By: Levan Hurst RN, BSN Signed: 11/13/2020 5:24:49 PM By: Sandre Kitty Entered By: Sandre Kitty on 11/07/2020 15:53:38 -------------------------------------------------------------------------------- Wound Assessment Details Patient Name: Date of Service: [name] Duke. 11/07/2020 11:00 A M Medical Record Number: [id number] Patient Account Number: [id number] Date of Birth/Sex: Treating RN: 06-13-1951 (70 y.o. [name] Primary Care Vaden Becherer: [name] Other Clinician: Referring Kollyn Lingafelter: Treating Catherene Kaleta/Extender: Catalina Pizza in Treatment: 170 Wound Status Wound Number: 64 Primary Diabetic Wound/Ulcer of the Lower Extremity Etiology: Wound Location: Left, Dorsal Foot Wound Open Wounding Event: Gradually Appeared Status: Date Acquired: 06/01/2019 Comorbid Asthma, Hypertension, Peripheral Arterial Disease, Peripheral Weeks Of Treatment: 75 History: Venous Disease, Type II Diabetes, Gout, Osteoarthritis Clustered Wound: No Photos Wound Measurements Length: (cm) 9 Width: (cm) 9.5 Depth: (cm) 0.1 Area: (cm) 67.152 Volume: (cm) 6.715 % Reduction in Area: -40598.2% % Reduction in Volume: -13604.1% Epithelialization: Small (1-33%) Tunneling: No Undermining: No Wound Description Classification: Grade 1 Wound Margin: Indistinct, nonvisible Exudate Amount: Medium Exudate Type: Serous Exudate Color: amber Foul Odor After Cleansing: No Slough/Fibrino No Wound Bed Granulation Amount: Large (67-100%) Exposed Structure Granulation Quality: Pink, Pale Fascia Exposed: No Necrotic Amount: None Present (0%) Fat Layer (Subcutaneous Tissue) Exposed: Yes Tendon Exposed: No Muscle Exposed: No Joint Exposed: No Bone Exposed: No Treatment Notes Wound #64 (Foot) Wound Laterality: Dorsal, Left Cleanser Peri-Wound Care Sween Lotion (Moisturizing lotion) Discharge Instruction: Apply moisturizing lotion to dry skin on legs Topical Primary Dressing Maxorb Extra Calcium Alginate  2x2 in Discharge Instruction: Apply calcium alginate to wound bed as instructed Secondary Dressing ABD Pad, 8x10 Discharge Instruction: Apply over primary dressing as directed. Secured With Compression Wrap ThreePress (3 layer compression wrap) Discharge Instruction: Apply three layer compression as directed. Pad bend of ankle with foam or ABD pad. Compression Stockings Add-Ons Electronic Signature(s) Signed: 11/07/2020  4:40:56 PM By: Levan Hurst RN, BSN Signed: 11/13/2020 5:24:49 PM By: Sandre Kitty Entered By: Sandre Kitty on 11/07/2020 15:53:10 -------------------------------------------------------------------------------- Wound Assessment Details Patient Name: Date of Service: [name] Duke. 11/07/2020 11:00 A M Medical Record Number: [id number] Patient Account Number: [id number] Date of Birth/Sex: Treating RN: Aug 01, 1950 (70 y.o. [name] Primary Care Evanna Washinton: [name] Other Clinician: Referring Aren Pryde: Treating Kalyn Dimattia/Extender: Catalina Pizza in Treatment: 170 Wound Status Wound Number: 67 Primary Diabetic Wound/Ulcer of the Lower Extremity Etiology: Wound Location: Right, Medial Lower Leg Wound Open Wounding Event: Gradually Appeared Status: Date Acquired: 05/02/2020 Comorbid Asthma, Hypertension, Peripheral Arterial Disease, Peripheral Weeks Of Treatment: 27 Weeks Of Treatment: 27 History: Venous Disease, Type II Diabetes, Gout, Osteoarthritis Clustered Wound: No Photos Wound Measurements Length: (cm) 2.7 Width: (cm) 4.2 Depth: (cm) 0.1 Area: (cm) 8.906 Volume: (cm) 0.891 % Reduction in Area: -529.8% % Reduction in Volume: -531.9% Epithelialization: Small (1-33%) Tunneling: No Undermining: No Wound Description Classification: Grade 1 Wound Margin: Indistinct, nonvisible Exudate Amount: Medium Exudate Type: Serosanguineous Exudate Color: red, brown Foul Odor After Cleansing: No Slough/Fibrino No Wound Bed Granulation Amount: Large (67-100%) Exposed Structure Granulation Quality: Pink, Pale Fascia Exposed: No Necrotic Amount: None Present (0%) Fat Layer (Subcutaneous Tissue) Exposed: Yes Tendon Exposed: No Muscle Exposed: No Joint Exposed: No Bone Exposed: No Treatment Notes Wound #67 (Lower Leg) Wound Laterality: Right, Medial Cleanser Peri-Wound Care Sween Lotion (Moisturizing lotion) Discharge Instruction:  Apply moisturizing lotion to dry skin on legs Topical Primary Dressing Maxorb Extra Calcium Alginate 2x2 in Discharge Instruction: Apply calcium alginate to wound bed as instructed Secondary Dressing ABD Pad, 8x10 Discharge Instruction: Apply over primary dressing as directed. Secured With Compression Wrap ThreePress (3 layer compression wrap) Discharge Instruction: Apply three layer compression as directed. Pad bend of ankle with foam or ABD pad. Compression Stockings Add-Ons Electronic Signature(s) Signed: 11/07/2020 4:40:56 PM By: Levan Hurst RN, BSN Signed: 11/13/2020 5:24:49 PM By: Sandre Kitty Entered By: Sandre Kitty on 11/07/2020 15:52:30 -------------------------------------------------------------------------------- Wound Assessment Details Patient Name: Date of Service: [name] Duke. 11/07/2020 11:00 A M Medical Record Number: [id number] Patient Account Number: [id number] Date of Birth/Sex: Treating RN: 04-Apr-1951 (70 y.o. [name] Primary Care Johnathin Vanderschaaf: [name] Other Clinician: Referring Alveta Quintela: Treating Cayson Kalb/Extender: Catalina Pizza in Treatment: 170 Wound Status Wound Number: 69 Primary Lymphedema Etiology: Wound Location: Right, Dorsal Foot Secondary Diabetic Wound/Ulcer of the Lower Extremity Wounding Event: Gradually Appeared Etiology: Date Acquired: 07/25/2020 Wound Healed - Epithelialized Weeks Of Treatment: 15 Status: Clustered Wound: No Comorbid Asthma, Hypertension, Peripheral Arterial Disease, Peripheral History: Venous Disease, Type II Diabetes, Gout, Osteoarthritis Wound Measurements Length: (cm) Width: (cm) Depth: (cm) Area: (cm) Volume: (cm) 0 % Reduction in Area: 100% 0 % Reduction in Volume: 100% 0 Epithelialization: Large (67-100%) 0 Tunneling: No 0 Undermining: No Wound Description Classification: Full Thickness Without Exposed Support Structures Wound Margin: Flat and  Intact Exudate Amount: None Present Foul Odor After Cleansing: No Slough/Fibrino No Wound Bed Granulation Amount: None Present (0%) Exposed Structure Necrotic Amount: None Present (0%) Fascia Exposed: No Fat Layer (Subcutaneous Tissue) Exposed: No Tendon Exposed: No Muscle Exposed: No  Joint Exposed: No Bone Exposed: No Electronic Signature(s) Signed: 11/07/2020 4:40:56 PM By: Levan Hurst RN, BSN Entered By: Levan Hurst on 11/07/2020 11:52:27 -------------------------------------------------------------------------------- Wound Assessment Details Patient Name: Date of Service: [name] Duke. 11/07/2020 11:00 A M Medical Record Number: [id number] Patient Account Number: [id number] Date of Birth/Sex: Treating RN: [birth year] (70 y.o. [name] Primary Care Alvy Alsop: [name] Other Clinician: Referring Deysy Schabel: Treating Roda Lauture/Extender: Catalina Pizza in Treatment: 170 Wound Status Wound Number: 70 Primary Venous Leg Ulcer Etiology: Wound Location: Right, Anterior Lower Leg Wound Healed - Epithelialized Wounding Event: Gradually Appeared Status: Date Acquired: 08/29/2020 Comorbid Asthma, Hypertension, Peripheral Arterial Disease, Peripheral Weeks Of Treatment: 10 Weeks Of Treatment: 10 History: Venous Disease, Type II Diabetes, Gout, Osteoarthritis Clustered Wound: No Wound Measurements Length: (cm) Width: (cm) Depth: (cm) Area: (cm) Volume: (cm) 0 % Reduction in Area: 100% 0 % Reduction in Volume: 100% 0 Epithelialization: Large (67-100%) 0 Tunneling: No 0 Undermining: No Wound Description Classification: Full Thickness Without Exposed Support Structures Wound Margin: Flat and Intact Exudate Amount: None Present Foul Odor After Cleansing: No Slough/Fibrino No Wound Bed Granulation Amount: None Present (0%) Exposed Structure Necrotic Amount: None Present (0%) Fascia Exposed: No Fat Layer (Subcutaneous Tissue)  Exposed: No Tendon Exposed: No Muscle Exposed: No Joint Exposed: No Bone Exposed: No Electronic Signature(s) Signed: 11/07/2020 4:40:56 PM By: Levan Hurst RN, BSN Entered By: Levan Hurst on 11/07/2020 11:52:47 -------------------------------------------------------------------------------- [place] Details Patient Name: Date of Service: [name] Duke. 11/07/2020 11:00 A M Medical Record Number: [id number] Patient Account Number: [id number] Date of Birth/Sex: Treating RN: 19-Jun-1950 (70 y.o. [name] Primary Care Emilian Stawicki: [name] Other Clinician: Referring Imer Foxworth: Treating Kayelee Herbig/Extender: Catalina Pizza in Treatment: 170 Vital Signs Time Taken: 11:44 Temperature (F): 98.2 Height (in): 62 Pulse (bpm): 94 Weight (lbs): 335 Respiratory Rate (breaths/min): 18 Body Mass Index (BMI): 61.3 Blood Pressure (mmHg): 143/79 Capillary Blood Glucose (mg/dl): 160 Reference Range: 80 - 120 mg / dl Notes glucose per pt report Electronic Signature(s) Signed: 11/07/2020 4:40:56 PM By: Levan Hurst RN, BSN Entered By: Levan Hurst on 11/07/2020 11:44:53

## 2020-11-21 ENCOUNTER — Encounter (HOSPITAL_BASED_OUTPATIENT_CLINIC_OR_DEPARTMENT_OTHER): Payer: Medicare PPO | Attending: Physician Assistant | Admitting: Physician Assistant

## 2020-11-21 ENCOUNTER — Other Ambulatory Visit: Payer: Self-pay

## 2020-11-21 DIAGNOSIS — I872 Venous insufficiency (chronic) (peripheral): Secondary | ICD-10-CM | POA: Insufficient documentation

## 2020-11-21 DIAGNOSIS — L97822 Non-pressure chronic ulcer of other part of left lower leg with fat layer exposed: Secondary | ICD-10-CM | POA: Diagnosis not present

## 2020-11-21 DIAGNOSIS — L97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 leg with fat layer exposed: Secondary | ICD-10-CM | POA: Diagnosis not present

## 2020-11-21 DIAGNOSIS — L97522 Non-pressure chronic ulcer of other part of left foot with fat layer exposed: Secondary | ICD-10-CM | POA: Insufficient documentation

## 2020-11-21 DIAGNOSIS — E11621 Type 2 diabetes mellitus with foot ulcer: Secondary | ICD-10-CM | POA: Insufficient documentation

## 2020-11-21 DIAGNOSIS — I890 Lymphedema, not elsewhere classified: Secondary | ICD-10-CM | POA: Insufficient documentation

## 2020-11-21 DIAGNOSIS — E1140 Type 2 diabetes mellitus with diabetic neuropathy, unspecified: Secondary | ICD-10-CM | POA: Diagnosis not present

## 2020-11-21 NOTE — Progress Notes (Addendum)
[name], [name] ([id number]) Visit Report for 11/21/2020 Chief Complaint Document Details Patient Name: Date of Service: [name], [name] 11/21/2020 1:00 PM Medical Record Number: [id number] Patient Account Number: [id number] Date of Birth/Sex: Treating RN: [birth year] (70 y.o. [name] Primary Care Provider: [name] Other Clinician: Referring Provider: Treating Provider/Extender: Darlen Round in Treatment: [place] from: Patient Chief Complaint Bilateral reoccurring LE ulcers Electronic Signature(s) Signed: 11/21/2020 2:39:52 PM By: [name] PA-C Entered By: [name] on 11/21/2020 14:39:52 -------------------------------------------------------------------------------- Debridement Details Patient Name: Date of Service: [name]. 11/21/2020 1:00 PM Medical Record Number: [id number] Patient Account Number: [id number] Date of Birth/Sex: Treating RN: 1950-07-17 (70 y.o. [name] Primary Care Provider: [name] Other Clinician: Referring Provider: Treating Provider/Extender: Darlen Round in Treatment: 172 Debridement Performed for Assessment: Wound #61 Left,Circumferential Lower Leg Performed By: Physician [name], PA Debridement Type: Debridement Severity of Tissue Pre Debridement: Fat layer exposed Level of Consciousness (Pre-procedure): Awake and Alert Pre-procedure Verification/Time Out Yes - 15:05 Taken: Start Time: 15:07 T Area Debrided (L x W): otal 10 (cm) x 8 (cm) = 80 (cm) Tissue and other material debrided: Non-Viable, Skin: Epidermis, Fibrin/Exudate Level: Skin/Epidermis Debridement Description: Selective/Open Wound Instrument: Curette Bleeding: None Procedural Pain: 0 Post Procedural Pain: 0 Response to Treatment: Procedure was tolerated well Level of Consciousness (Post- Awake and Alert procedure): Post Debridement Measurements of Total  Wound Length: (cm) 9 Width: (cm) 26 Depth: (cm) 0.1 Volume: (cm) 18.378 Character of Wound/Ulcer Post Debridement: Requires Further Debridement Severity of Tissue Post Debridement: Fat layer exposed Post Procedure Diagnosis Same as Pre-procedure Electronic Signature(s) Signed: 11/21/2020 5:40:25 PM By: [name] PA-C Signed: 11/21/2020 6:12:56 PM By: [name] RN, BSN Entered By: [name] on 11/21/2020 15:17:09 -------------------------------------------------------------------------------- Debridement Details Patient Name: Date of Service: [name]. 11/21/2020 1:00 PM Medical Record Number: [id number] Patient Account Number: [id number] Date of Birth/Sex: Treating RN: Sep 17, 1950 (70 y.o. [name] Primary Care Provider: [name] Other Clinician: Referring Provider: Treating Provider/Extender: Darlen Round in Treatment: 172 Debridement Performed for Assessment: Wound #67 Right,Medial Lower Leg Performed By: Physician [name], PA Debridement Type: Debridement Severity of Tissue Pre Debridement: Fat layer exposed Level of Consciousness (Pre-procedure): Awake and Alert Pre-procedure Verification/Time Out Yes - 15:05 Taken: Start Time: 15:07 T Area Debrided (L x W): otal 4 (cm) x 4 (cm) = 16 (cm) Tissue and other material debrided: Non-Viable, Skin: Epidermis, Fibrin/Exudate Level: Skin/Epidermis Debridement Description: Selective/Open Wound Instrument: Curette Bleeding: None End Time: 15:20 Procedural Pain: 0 Post Procedural Pain: 0 Response to Treatment: Procedure was tolerated well Level of Consciousness (Post- Awake and Alert procedure): Post Debridement Measurements of Total Wound Length: (cm) 2.4 Width: (cm) 4 Depth: (cm) 0.1 Volume: (cm) 0.754 Character of Wound/Ulcer Post Debridement: Requires Further Debridement Severity of Tissue Post Debridement: Fat layer exposed Post Procedure  Diagnosis Same as Pre-procedure Electronic Signature(s) Signed: 11/21/2020 5:40:25 PM By: [name] PA-C Signed: 11/21/2020 6:12:56 PM By: [name] RN, BSN Entered By: [name] on 11/21/2020 15:18:10 -------------------------------------------------------------------------------- HPI Details Patient Name: Date of Service: [name]. 11/21/2020 1:00 PM Medical Record Number: [id number] Patient Account Number: [id number] Date of Birth/Sex: Treating RN: 12-May-1951 (70 y.o. [name] Primary Care Provider: [name] Other Clinician: Referring Provider: Treating Provider/Extender: Darlen Round in Treatment: 172 History of Present Illness HPI Description: this patient has been seen  a couple of times before and returns with recurrent problems to her right and left lower extremity with swelling and weeping ulcerations due to not wearing her compression stockings which she had been advised to do during her last discharge, at the end of June 2018. During her last visit the patient had had normal arterial blood flow and her venous reflux study did not necessitate any surgical intervention. She was recommended compression and elevation and wound care. After prolonged treatment the patient was completely healed but she has been noncompliant with wearing or compressions.. She was here last week with an outpatient return visit planned but the patient came in a very poor general condition with altered mental status and was rushed to the ER on my request. With a history of hypertension, diabetes, TIA and right-sided weakness she was set up for an MRI on her brain and cervical spine and was sent to [facility]. Getting an MRI done was very difficult but once the workup was done she was found not to have any spinal stenosis, epidural abscess or hematoma or discitis. This was radiculopathy to be treated as an outpatient and she was given a follow-up  appointment. Today she is feeling much better alert and oriented and has come to reevaluate her bilateral lower extremity lymphedema and ulceration 03/25/2017 -- she was admitted to the hospital on 03/16/2017 and discharged on 03/18/2017 with left leg cellulitis and ulceration. She was started on vancomycin and Zosyn and x-ray showed no bony involvement. She was treated for a cellulitis with IV antibiotics changed to Rocephin and Flagyl and was discharged on oral Keflex and doxycycline to complete a 7 day course. Last hemoglobin A1c was 7.1 and her other ailments including hypertension got asthma were appropriately treated. 05/06/2017 -- she is awaiting the right size of compression stockings from [place] but other than that has been doing well. ====== Old notes 70 year old patient was seen one time last October and was lost to follow-up. She has recurrent problems with weeping and ulceration of her left lower extremity and has swelling of this for several years. It has been worse for the last 2 months. Past medical history is significant for diabetes mellitus type 2, hypertension, gout, morbid obesity, depressive disorders, hiatal hernia, migraines, status post knee surgery, risk of a cholecystectomy, vaginal hysterectomy and breast biopsy. She is not a smoker. As noted before she has never had a venous duplex study and an arterial ABI study was attempted but the left lower extremity was noncompressible 10/01/2016 -- had a lower extremity venous duplex reflux evaluation which showed no evidence of deep vein reflux in the right or left lower extremity, and no evidence of great saphenous vein reflux more than 500 ms in the right or left lower extremity, and the left small saphenous vein is incompetent but no vascular consult was recommended. review of her electronic medical records noted that the ABI was checked in July 2017 where the right ABI was normal limits and the left ABI could not  be ascertained due to pain with cuff pressure but the waveforms are within normal limits. her arterial duplex study scheduled for April 27. 10/08/2016 -- the patient has various reasons for not having a compression on and for the last 3 days she has had no compression on her left lower extremity either due to pain or the lack of nursing help. She does not use her juxta lites either. 10/15/2016 -- the patient did not keep her appointment for arterial duplex study  on April 27 and I have asked her to reschedule this. Her pain is out of proportion with the physical findings and she continuously fails to wear a compression wraps and cuts them off because she says she cannot tolerate the pain. She does not use her juxta lites either. 10/22/2016 -- he has rescheduled her arterial duplex study to May 21 and her pain today is a bit better. She has not been wearing her juxta lites on her right lower extremity but now understands that she needs to do this. She did tolerate the to press compression wrap on her left lower extremity 10/29/2016 --arterial duplex study is scheduled for next week and overall she has been tolerating her compression wraps and also using her juxta lites on her right lower extremity 11/05/2016 -- the right ABI was 0.95 the left was 1.03. The digit TBI is on the right was 0.83 on the left was 0.92 and she had biphasic flow through these vessels. The impression was that of normal lower extremity arterial study. 11/12/2016 -- her pain is minimal and she is doing very well overall. 11/26/2016 -- she has got juxta lites and her insurance will not pay for additional dual layer compression stockings. She is going to order some from [place]. 05/12/2017 -- her juxta lites are very old and too big for her and these have not been helping with compression. She did get 20-30 mm compression stockings from [place] but she and her husband are unable to put these on. I believe she will benefit from  bilateral Extremit-ease, compression stockings and we will measure her for these today. 05/20/2017 -- lymphedema on the left lower extremity has increased a lot and she has a open ulceration as a result of this. The right lower extremity is looking pretty good. She has decided to by the compression stockings herself and will get reimbursed by the home health, at a later date. 05/27/2017 -- her sciatica is bothering her a lot and she thought her left leg pain was caused due to the compression wrap and hence removed it and has significant lymphedema. There is no inflammation on this left lower extremity. 06/17/17 on evaluation today patient appears to be doing very well and in fact is completely healed in regard to her ulcerations. Unfortunately however she does have continued issues with lymphedema nonetheless. We did order compression garments for her unfortunately she states that the size that she received were large although we ordered medium. Obviously this means she is not getting the optimal compression. She does not have those with her today and therefore we could not confirm and contact the company on her behalf. Nonetheless she does state that she is going to have her husband bring them by tomorrow so that we can verify and then get in touch with the company. No fevers, chills, nausea, or vomiting noted at this time. Overall patient is doing better otherwise and I'm pleased with the progress she has made. 07/01/17 on evaluation today patient appears to be doing very well in regard to her bilateral lower extremity she does not have any openings at this point which is excellent news. Overall I'm pleased with how things have progressed up to this time. Since she is doing so well we did order her compression which we are seeing her today to ensure that it fits her properly and everything is doing well in that regard and then subsequently she will be discharged. ============ Old Notes: 03/31/16  patient presents today for evaluation  concerning open wounds that she has over the left medial ankle region as well as the left dorsal foot. She has previously had this occur although it has been healed for a number of months after having this for about a year prior until her hospitalization on 01/05/16. At that point in time it appears that she was admitted to the hospital for left lower extremity cellulitis and was placed on vancomycin and Zosyn at that point. Eventually upon discharge on January 15, 2016 she was placed on doxycycline at that point in time. Later on 03/27/16 positive wound culture growing Escherichia coli this was switched to amoxicillin. Currently she tells me that she is having pain radiated to be a 7 out of 10 which can be as high as 10 out of 10 with palpation and manipulation of the wound. This wound appears to be mainly venous in nature due to the bilateral lower extremity venous stasis/lymphedema. This is definitely much worse on her left than the right side. She does have type 1 diabetes mellitus, hypertension, morbid obesity, and is wheelchair dependent.during the course of the hospital stay a blood culture was also obtained and fortunately appeared negative. She also had an x-ray of the tibia/fibula on the left which showed no acute bone abnormality. Her white blood cell count which was performed last on 03/25/16 was 7.3, hemoglobin 12.8, protein 7.1, albumin 3.0. Her urine culture appeared to be negative for any specific organisms. Patient did have a left lower extremity venous duplex evaluation for DVT . This did not include venous reflux studies but fortunately was negative for DVT Patient also had arterial studies performed which revealed that she had a . normal ABI on the right though this was unable to be performed on the left secondary to pain that she was having around the ankle region due to the wound. However it was stated on report that she had biphasic pulses and  apparently good blood flow. ========== 06/03/17 she is here in follow-up evaluation for right lower extremity ulcer. The right lower sure he has healed but she has reopened to the left medial malleolus and dorsal foot with weeping. She is waiting for new compression garments to arrive from home health, the previous compression garments were ill fitting. We will continue with compression bilaterally and follow-up in 2 weeks Readmission: 08/05/17 on evaluation today patient appears to be doing somewhat poorly in regard to her left lower extremity especially although the right lower extremity has a small area which may no longer be open. She has been having a lot of drainage from the left lower extremity however he tells me that she has not been able to use the EXTREMIT-EASE Compression at this point. She states that she did better and was able to actually apply the Juxta-Lite compression although the wound that she has is too large and therefore really does not compress which is why she cannot wear it at this point. She has no one who can help her put it on regular basis her son can sometimes but he's not able to do it most of the time. I do believe that's why she has begun to weave and have issues as she is currently yet again. No fevers, chills, nausea, or vomiting noted at this time. Patient is no evidence of dementia. 08/12/17 on evaluation today patient appears to still be doing fairly well in regard to the draining areas/weeping areas at this point. With that being said she unfortunately did go to the ER  yesterday due to what was felt to be possibly a cellulitis. They place her on doxycycline by mouth and discharge her home. She definitely was not admitted. With that being said she states she has had more discomfort which has been unusual for her even compared to prior times and she's had infections.08/12/17 on evaluation today patient appears to still be doing fairly well in regard to the draining  areas/weeping areas at this point. With that being said she unfortunately did go to the ER yesterday due to what was felt to be possibly a cellulitis. They place her on doxycycline by mouth and discharge her home. She definitely was not admitted. With that being said she states she has had more discomfort which has been unusual for her even compared to prior times and she's had infections. 08/19/17 put evaluation today patient tells me that she's been having a lot of what sounds to be neuropathic type pain in regard to her left lower extremity. She has been using over-the-counter topical bins again which some believe. That in order to apply the she actually remove the wrap we put on her last Wednesday on Thursday. Subsequently she has not had anything on compression wise since that time. The good news is a lot of the weeping areas appear to have closed at this point again I believe she would do better with compression but we are struggling to get her to actually use what she needs to at this point. No fevers, chills, nausea, or vomiting noted at this time. 09/03/17 on evaluation today patient appears to be doing okay in regard to her lower extremities in regard to the lymphedema and weeping. Fortunately she does not seem to show any signs of infection at this point she does have a little bit of weeping occurring in the right medial malleolus area. With that being said this does not appear to be too significant which is good news. 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 She has severe skin damage secondary to both of these features involving the dorsal left foot and medial left ankle and lower leg. Still has open areas in the left anterior foot. The area on the right closed over. She uses her own juxta light stockings. She does not have an arterial issue 09/16/17 on evaluation today patient actually appears to be doing excellent in  regard to her bilateral lower extremity swelling. The Juxta-Lite compression wrap seem to be doing very well for her. She has not however been using the portion that goes over her foot. Her left foot still is draining a little bit not nearly as significant as it has been in the past but still I do believe that she likely needs to utilize the full wrap including the foot portion of this will improve as well. She also has been apparently putting on a significant amount of Vaseline which also think is not helpful for her. I recommended that if she feels she needs something for moisturizer Eucerin will probably be better. 09/30/17 on evaluation today patient presents with several new open areas in regard to her left lower extremity although these appear to be minimal and mainly seem to be more moisture breakdown than anything. Fortunately she does not seem to have any evidence of infection which is great news. She has been tolerating the dressing changes without complication we are using silver alginate on the foot she has been using AB pads to have the legs and  using her Juxta- Lite compression which seems to be controlling her swelling very well. Overall I'm pleased with the poor way she has progressed. 10/14/17 on evaluation today patient appears to be doing better in regard to her left lower extremity areas of weeping. She does still have some discomfort although in general this does not appear to be as macerated and I think it is progressing nicely. I do think she still needs to wear the foot portion of her Juxta- Lite in order to get the most benefit from the wrap obviously. She states she understands. Fortunately there does not appear to be evidence of infection at this time which is great news. 10/28/17 on evaluation today patient appears to be doing excellent in regard to her left lower extremity. She has just a couple areas that are still open and seem to be causing any trouble whatsoever. For that  reason I think that she is definitely headed in the right direction the spots are very tiny compared to what we have been dealing with in the past. 11/11/17 on evaluation today patient appears to have a right lateral lower extremity ulcer that has opened since I last saw her. She states this is where the home health nurse that was coming out remove the dressing without wetting the alginate first. Nonetheless I do not know if this is indeed the case or not but more importantly we have not ordered home help to be coming out for her wounds at all. I'm unsure as to why they are coming out and we're gonna have to check on this and get things situated in that regard. With that being said we currently really do not need them to be coming out as the patient has been taking care of her leg herself without complication and no issues. In fact she was doing much better prior to nursing coming out. 11/25/17 on evaluation today patient actually appears to be doing fairly well in regard to her left lower extremity swelling. In fact she has very little area of weeping at this point there's just a small spot on the lateral portion of her right leg that still has me just a little bit more concerned as far as wanting to see this clear up before I discharge her to caring for this at home. Nonetheless overall she has made excellent progress. 12/09/17 on evaluation today patient appears to be doing rather well in regard to her lower extremity edema. She does have some weeping still in the left lower extremity although the big area we were taking care of two weeks ago actually has closed and she has another area of weeping on the left lower extremity immediately as well is the top of her foot. She does not currently have lymphedema pumps she has been wearing her compression daily on a regular basis as directed. With that being said I think she may benefit from lymphedema pumps. 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 We had a very difficult time getting and keeping this under control. 12/23/17 on evaluation today patient actually appears to be doing a little bit more poorly in regard to her bilateral lower extremities. She has been tolerating the Juxta-Lite compression wraps. Unfortunately she has two new ulcers on the right lower extremity and left lower [place] ulceration seems to be larger. Obviously this is  not good news. She has been tolerating the dressings without complication. 12/30/17 on evaluation today patient actually appears to be doing much better in regard to her bilateral lower extremity edema. She continues to have some issues with ulcerations and in fact there appears to be one spot on each leg where the wrap may have caused a little bit of a blister which is subsequently opened up at this point is given her pain. Fortunately it does not appear to be any evidence of infection which is good news. No fevers chills noted. 01/13/18 on evaluation today patient appears to be doing rather well in regard to her bilateral lower extremities. The dressings did get kind of stuck as far as the wound beds are concerned but again I think this is mainly due to the fact that she actually seems to be showing signs of healing which is good news. She's not having as much drainage therefore she was having more of the dressing sticking. Nonetheless overall I feel like her swelling is dramatically down compared to previous. 01/20/18 on evaluation today patient unfortunately though she's doing better in most regards has a large blister on the left anterior lower extremity where she is draining quite significantly. Subsequently this is going to need debridement today in order to see what's underneath and ensure she does not continue to trapping fluid at this location. Nonetheless No  fevers, chills, nausea, or vomiting noted at this time. 01/27/18 on evaluation today patient appears to be doing rather well at this point in regard to her right lower extremity there's just a very small area that she still has open at this point. With that being said I do believe that she is tolerating the compression wraps very well in making good progress. Home health is coming out at this point to see her. Her left lower extremity on the lateral portion is actually what still mainly open and causing her some discomfort for the most part 02/10/18 on evaluation today patient actually appears to be doing very well in regard to her right lower extremity were all the ulcers appear to be completely close. In regard to the left lower extremity she does have two areas still open and some leaking from the dorsal surface of her foot but this still seems to be doing much better to me in general. 02/24/18 on evaluation today patient actually appears to be doing much better in regard to her right lower extremity this is still completely healed. Her left lower extremity is also doing much better fortunately she has no evidence of infection. The one area that is gonna require some debridement is still on the left anterior shin. Fortunately this is not hurting her as badly today. 03/10/18 on evaluation today patient appears to be doing better in some regards although she has a little bit more open area on the dorsal foot and she also has some issues on the medial portion of the left lower extremity which is actually new and somewhat deep. With that being said there fortunately does not appear to be any significant signs of infection which is good news. No fevers, chills, nausea, or vomiting noted at this time. In general her swelling seems to be doing fairly well which is good news. 03/31/18 on evaluation today patient presents for follow-up concerning her left lower extremity lymphedema. Unfortunately she has been  doing a little bit more poorly since I last saw her in regard to the amount of weeping that she is experiencing. She's  also having some increased pain in the anterior shin location. Unfortunately I do not feel like the patient is making such good progress at this point a few weeks back she was definitely doing much better. 04/07/18 on evaluation today patient actually appears to be showing some signs of improvement as far as the left lower extremity is concerned. She has been tolerating the dressing changes and it does appear that the Drawtex did better for her. With that being said unfortunately home health is stating that they cannot obtain the Drawtex going forward. Nonetheless we're gonna have to check and see what they may be able to get the alginate they were using was getting stuck in causing new areas of skin being pulled all that with and subsequently weep and calls her to worsen overall this is the first time we've seen improvement at this time. 04/14/18 on evaluation today patient actually appears to be doing rather well at this point there does not appear to be any evidence of infection at this time and she is actually doing excellent in regard to the weeping in fact she almost has no openings remaining even compared to just last week this is a dramatic improvement. No fevers chills noted 04/21/18 evaluation today patient actually appears to be doing very well. She in fact is has a small area on the posterior lower extremity location and she has a small area on the dorsal surface of her foot that are still open both of which are very close to closing. We're hoping this will be close shortly. She brought her Juxta-Lite wrap with her today hoping that would be able to put her in it unfortunately I don't think were quite at that point yet but we're getting closer. 04/28/18 upon evaluation today patient actually appears to be doing excellent in regard to her left lower extremity ulcer. In fact  the region on the posterior lower extremity actually is much smaller than previously noted. Overall I'm very happy with the progress she has made. She again did bring her Juxta-Lite although we're not quite ready for that yet. 05/11/18 upon evaluation today patient actually appears to be doing in general fairly well in regard to her left lower Trinity. The swelling is very well controlled. With that being said she has a new area on the left anterior lower extremity as well as between the first and second toes of her left foot that was not present during the last evaluation. The region of her posterior left lower extremity actually appears to be almost completely healed. T be honest I'm very pleased with o the way that stands. Nonetheless I do believe that the lotion may be keeping the area to moist as far as her legs are concerned subsequently I'm gonna consider discontinuing that today. 05/26/18 on evaluation today patient appears to be doing rather well in regard to her left lower should be ulcers. In fact everything appears to be close except for a very small area on the left posterior lower extremity. Fortunately there does not appear to be any evidence of infection at this time. Overall very pleased with her progress. 06/02/18 and evaluation today patient actually appears to be doing very well in regard to her lower extremity ulcers. She has one small area that still continues to weep that I think may benefit her being able to justify lotion and user Juxta-Lite wraps versus continued to wrap her. Nonetheless I think this is something we can definitely look into at this point. 06/23/18  on evaluation today patient unfortunately has openings of her bilateral lower extremities. In general she seems to be doing much worse than when I last saw her just as far as her overall health standpoint is concerned. She states that her discomfort is mainly due to neuropathy she's not having any other  issues otherwise. No fevers, chills, nausea, or vomiting noted at this time. 06/30/18 on evaluation today patient actually appears to be doing a little worse in regard to her right lower extremity her left lower extremity of doing fairly well. Fortunately there is no sign of infection at this time. She has been tolerating the dressing changes without complication. Home health did not come out like they were supposed to for the appropriate wrap changes. They stated that they never received the orders from [place] which were fax. Nonetheless we will send a copy of the orders with the patient today as well. 07/07/18 on evaluation today patient appears to be doing much better in regard to lower extremities. She still has several openings bilaterally although since I last saw her her legs did show obvious signs of infection when she later saw her nurse. Subsequently a culture was obtained and she is been placed on Bactrim and Keflex. Fortunately things seem to be looking much better it does appear she likely had an infection. Again last week we'd even discussed it but again there really was not any obvious sign that she had infection therefore we held off on the antibiotics. Nonetheless I'm glad she's doing better today. 07/14/18 on evaluation today patient appears to be doing much better regarding her bilateral lower Trinity's. In fact on the right lower for me there's nothing open at this point there are some dry skin areas at the sites where she had infection. Fortunately there is no evidence of systemic infection which is excellent news. No fevers chills noted 07/21/18 on evaluation today patient actually appears to be doing much better in regard to her left lower extremity ulcers. She is making good progress and overall I feel like she's improving each time I see her. She's having no pain I do feel like the infection is completely resolved which is excellent news. No fevers, chills, nausea, or vomiting noted at  this time. 07/28/18 on evaluation today patient appears to be doing very well in regard to her left lower [employer]. Everything seems to be showing signs of improvement which is excellent news. Overall very pleased with the progress that has been made. Fortunately there's no evidence of active infection at this time also excellent news. 08/04/18 on evaluation today patient appears to be doing more poorly in regard to her bilateral lower extremities. She has two new areas open up on the right and these were completely closed as of last week. She still has the two spots on the left which in my pinion seem to be doing better. Fortunately there's no evidence of infection again at this point. 08/11/18 on evaluation today patient actually appears to be doing very well in regard to her bilateral lower Trinity wounds that all seem to be doing better and are measures smaller today. Fortunately there's no signs of infection. No fevers, chills, nausea, or vomiting noted at this time. 08/18/18 on evaluation today patient actually appears to be doing about the same inverter bilateral lower extremities. She continues to have areas that blistering open as was drain that fortunately nothing too significant. Overall I feel like Drawtex may have done better for her however compared to  the collagen. 08/25/18 on evaluation today patient appears to be doing a little bit more poorly today even compared to last time I saw her. Again I'm not exactly sure why she's making worse progress over the past several weeks. I'm beginning to wonder if there is some kind of underlying low level infection causing this issue. I did actually take a culture from the left anterior lower extremity but it was a new wound draining quite a bit at this point. Unfortunately she also seems to be having more pain which is what also makes me worried about the possibility of infection. This is despite never erythema noted at this point. 09/01/18 on  evaluation today patient actually appears to be doing a little worse even compared to last week in regard to bilateral lower extremities. She did go to the hospital on the 16th was given a dose of IV Zosyn and then discharged with a recommendation to continue with the Bactrim that I previously prescribed for her. Nonetheless she is still having a lot of discomfort she tells me as well at this time. This is definitely unfortunate. No fevers, chills, nausea, or vomiting noted at this time. 09/08/18 on evaluation today patient's bilateral lower extremities actually appear to be shown signs of improvement which is good news. Fortunately there does not appear to be any signs of active infection I think the anabiotic is helping in this regard. Overall I'm very pleased with how she is progressing. 09/15/18 patient was actually seen in ER yesterday due to her legs as well unfortunately. She states she's been having a lot of pain and discomfort as well as a lot of drainage. Upon inspection today the patient does have a lot of swelling and drainage I feel like this is more related to lymphedema and poor fluid control than it is to infection based on what I'm seeing. The physician in the emergency department also doubted that the patient was having a significant infection nonetheless I see no evidence of infection obvious at this point although I do see evidence of poor fluid control. She still not using a compression pumps, she is not elevating due to her lift chair as well as her hospital bed being broken, and she really is not keeping her legs up as much as they should be and also has been taking off her wraps. All this combined I think has led to poor fluid control and to be honest she may be somewhat volume overloaded in general as well. I recommend that she may need to contact your physician to see if a prescription for a diuretic would be beneficial in their opinion. As long as this is safe I think it would  likely help her. 09/29/18 on evaluation today patient's left lower extremity actually appears to be doing quite a bit better. At least compared to last time that I saw her. She still has a large area where she is draining from but there's a lot of new skin speckled trout and in fact there's more new skin that there are open areas of weeping and drainage at this point. This is good news. With regard to the right lower extremity this is doing much better with the only open area that I really see being a dry spot on the right lateral ankle currently. Fortunately there's no signs of active infection at this time which is good news. No fevers, chills, nausea, or vomiting noted at this time. The patient seems somewhat stressed and overwhelmed during the  visit today she was very lethargic as such. She does and she is not taking any pain medications at this point. Apparently according to her husband are also in the process of moving which is probably taking its toll on her as well. 10/06/18 on evaluation today patient appears to be doing rather well in regard to her lower extremities compared to last evaluation. Fortunately there's no signs of active infection. She tells me she did have an appointment with her primary. Nonetheless he was concerned that the wounds were somewhat deep based on pictures but we never actually saw her legs. She states that he had her somewhat worried due to the fact that she was fearing now that she was [place] have to have an amputation. With that being said based on what I'm seeing check she looks better this week that she has the last two times I've seen her with much less drainage I'm actually pleased in this regard. That doesn't mean that she's out of the water but again I do not think what the point of talking about education at all in regard to her leg. She is very happy to hear this. She is also not having as much pain as she was having last week. 10/13/18 unfortunately on  evaluation today patient still continues to have a significant amount of drainage she's not letting home health actually apply the compression dressings at this point. She's trying to use of Juxta-Lite of the top of Kerlex and the second layer of the three layer compression wrap. With that being said she just does not seem to be making as good a progress as I would expect if she was having the compression applied and in place on a regular basis. No fevers, chills, nausea, or vomiting noted at this time. 10/20/18 on evaluation today patient appears to be doing a little better in regard to her bilateral lower extremity ulcers. In fact the right lower extremity seems to be healed she doesn't even have any openings at this point left lower extremity though still somewhat macerated seems to be showing signs of new skin growth at multiple locations throughout. Fortunately there's no evidence of active infection at this time. No fevers, chills, nausea, or vomiting noted at this time. 10/27/18 on evaluation today patient appears to be doing much better in regard to her left lower Trinity ulcer. She's been tolerating the laptop complication and has minimal drainage noted at this point. Fortunately there's no signs of active infection at this time. No fevers, chills, nausea, or vomiting noted at this time. 11/03/18 on evaluation today patient actually appears to be doing excellent in regard to her left lower extremity. She is having very little drainage at this point there does not appear to be any significant signs of infection overall very pleased with how things have gone. She is likewise extremely pleased still and seems to be making wonderful progress week to week. I do believe antibiotics were helpful for her. Her primary care provider did place on amateur clean since I last saw her. 11/17/18 on evaluation today patient appears to be doing worse in regard to her bilateral lower extremities at this point. She is  been tolerating the dressing changes without complication. With that being said she typically takes the Coban off fairly quickly upon arriving home even after being seen here in the clinic and does not allow home health reapply command as part of the dressing at home. Therefore she said no compression essentially since I last  saw her as best I can tell. With that being said I think it shows and how much swelling she has in the open wounds that are noted at this point. Fortunately there's no signs of infection but unfortunately if she doesn't get this under control I think she will end up with infection and more significant issues. 11/24/18 on evaluation today patient actually appears to be doing somewhat better in regard to her bilateral lower extremities. 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 Nonetheless that is doing better currently so she should be able to attempt reinitiating the lymphedema pumps at this time. No fevers, chills, nausea, or vomiting noted at this time. 12/01/18 upon evaluation today patient's left lower extremity appears to be doing quite well unfortunately her right lower extremity is not doing nearly as well. She has been tolerating the dressing changes without complication unfortunately she did not keep a wrap on the right at this time. Nonetheless I believe this has led to increased swelling and weeping in the world is actually much larger than during the last evaluation with her. 12/08/18 on evaluation today patient appears to be doing about the same at this point in regard to her right lower extremity. There is some more palatable to touch I'm concerned about the possibility of there being some infection although I think the main issue is she's not keeping her compression wrap on which in turn is not allowing this area to heal  appropriately. 12/22/18 on evaluation today patient appears to be doing better in regard to left lower extremity unfortunately significantly worse in regard to the right lower extremity. The areas of blistering and necrotic superficial tissue have spread and again this does not really appear to be signs of infection and all she just doesn't seem to be doing nearly as well is what she has been in the past. Overall I feel like the Augmentin did absolutely nothing for her she doesn't seem to have any infection again I really didn't think so last time either is more of a potential preventative measure and hoping that this would make some difference but I think the main issue is she's not wearing her compression. She tells me she cannot wear the [place] been we put on she takes it off pretty much upon getting home. Subsequently she worshiped Juxta-Lite when I questioned her about how often she wears it this is no more than three hours a day obviously that leaves 21 hours that she has no compression and this is obviously not doing well for her. Overall I'm concerned that if things continue to worsen she is at great risk of both infection as well as losing her leg. 01/05/19 on evaluation today patient appears to be doing well in regard to her left lower extremity which he is allowing [place] to wrap and not so well with regard to her right lower extremity which she is not allowing [place] to really wrap and keep the wrap on. She states that it hurts too badly whenever it's wrapped and she ends up having to take it off. She's been using the Juxta-Lite she tells me up to six hours a day although I question whether or not that's really been the case to be honest. Previously she told me three hours today nonetheless obviously the legs as long  as the wrap is doing great when she is not is doing much more poorly. 01/12/2019 on evaluation today patient actually appears to be doing a little better in my opinion with regard to her  right lower extremity ulcer. She has a small open area on the left lower extremity unfortunately but again this I think is part of the normal fluctuation of what she is going to have to expect with regard to her legs especially when she is not using her lymphedema pumps on a regular basis. Subsequently based on what I am seeing today I think that she does seem to be doing slightly better with regard to her right lower extremity she did see her primary care provider on Monday they felt she had an infection and placed her on 2 antibiotics. Both Cipro and clindamycin. Subsequently again she seems possibly to be doing a little bit better in regards to the right lower extremity she also tells me however she has been wearing the compression wrap over the past week since I spoke with her as well that is a Kerlix and Coban wrap on the right. No fevers, chills, nausea, vomiting, or diarrhea. 01/19/2019 on evaluation today patient appears to be doing better with regard to her bilateral lower extremities especially the right. I feel like the compression has been beneficial for her which is great news. She did get a call from her primary care provider on her way here today telling her that she did have methicillin- resistant Staphylococcus aureus and he was calling in a couple new antibiotics for her including a ointment to be applied she tells me 3 times a day. With that being said this sounds like likely to be Bactroban which I think could be applied with each dressing/wrap change but I would not be able to accommodate her applying this 3 times a day. She is in agreement with the least doing this we will add that to her orders today. 01/26/2019 on evaluation today patient actually appears to be doing much better with regard to her right lower extremity. Her left lower extremity is also doing quite well all things considering. Fortunately there is no evidence of active infection at this time. No fevers, chills,  nausea, vomiting, or diarrhea. 02/02/2019 on evaluation today patient appears to be doing much better compared to her last evaluation. Little by little off like her right leg is returning more towards normal. There does not appear to be any signs of active infection and overall she seems to be doing quite well which is great news. I am very pleased in this regard. No fevers, chills, nausea, vomiting, or diarrhea. 02/09/2019 upon evaluation today patient appears to be doing better with regard to her bilateral lower extremities. She has been tolerating the dressing changes without complication. Fortunately there is no signs of active infection at this time. No fevers, chills, nausea, vomiting, or diarrhea. 02/23/2019 on evaluation today patient actually appears to be doing quite well with regard to her bilateral lower extremities. She has been tolerating the dressing changes without complication. She is even used her pumps one time and states that she really felt like it felt good. With that being said she seems to be in good spirits and her legs appear to be doing excellent. 03/09/2019 on evaluation today patient appears to be doing well with regard to her right lower extremity there are no open wounds at this time she is having some discomfort but I feel like this is more neuropathy  than anything. With regard to her left lower extremity she had several areas scattered around that she does have some weeping and drainage from but again overall she does not appear to be having any significant issues and no evidence of infection at this time which is good news. 03/23/2019 on evaluation today patient appears to be doing well with regard to her right lower extremity which she tells me is still close she is using her juxta light here. Her left lower extremity she mainly just has an area on the foot which is still slightly draining although this also is doing great. Overall very pleased at this time. 04/06/2019  patient appears to be doing a little bit worse in regard to her left lower extremity upon evaluation today. She feels like this could be becoming infected again which she had issues with previous. Fortunately there is no signs of systemic infection but again this is always a struggle with her with her legs she will go from doing well to not so well in a very short amount of time. 04/20/2019 on evaluation today patient actually appears to be doing quite well with regard to her right lower extremity I do not see any signs of active infection at this time. Fortunately there is no fever chills noted. She is still taking the antibiotics which I prescribed for her at this point. In regard to the left lower extremity I do feel like some of these areas are better although again she still is having weeping from several locations at this time. 04/27/2019 on evaluation today patient appears to be doing about the same if not slightly worse in regard to her left lower extremity ulcers. She tells me when questioned that she has been sleeping in her Hoveround chair in fact she tells me she falls asleep without even knowing it. I think she is spending a whole lot of time in the chair and less time walking and moving around which is not good for her legs either. On top of that she is in a seated position which is also the worst position she is not really elevating her legs and she is also not using her lymphedema pumps. All this is good to contribute to worsening of her condition in general. 05/18/2019 on evaluation today patient appears to be doing well with regard to her lower extremity on the right in fact this is showing no signs of any open wounds at this time. On the left she is continuing to have issues with areas that do drain. Some of the regions have healed and there are couple areas that have reopened. She did go to the ER per the patient according to recommendations from the home health nurse due to what she  was seen when she came out on 05/13/2019. Subsequently she felt like the patient needed to go to the hospital due to the fact that again she was having "milky white discharge" from her leg. Nonetheless she had and then was placed on doxycycline and subsequently seems to be doing better. 06/01/2019 upon evaluation today patient appears to be doing really in my opinion about the same. I do not see any signs of active infection which is good news. Overall she still has wounds over the bilateral lower extremities she has reopened on the right but this appears to be more of a crack where there is weeping/edema coming from the region. I do not see any evidence of infection at either site based on what I visualized  today. 07/13/2019 upon evaluation today patient appears to be doing a little worse compared to last time I saw her. She since has been in the hospital from 06/21/2019 through 06/29/2019. This was secondary to having Covid. During that time they did apply lotion to her legs which unfortunately has caused her to develop a myriad of open wounds on her lower extremities. Her legs do appear to be doing better as far as the overall appearance is concerned but nonetheless she does have more open and weeping areas. 07/27/2019 upon evaluation today patient appears to be doing more poorly to be honest in regard to her left lower extremity in particular. There is no signs of systemic infection although I do believe she may have local infection. She notes she has been having a lot of blue/green drainage which is consistent potentially with Pseudomonas. That may be something that we need to consider here as well. The doxycycline does not seem to have been helping. 08/03/2019 upon evaluation today patient appears to be doing a little better in my opinion compared to last week's evaluation. Her culture I did review today and she is on appropriate medications to help treat the Enterobacter that was noted. Overall I  feel like that is good news. With that being said she is unfortunately continuing to have a lot of drainage and though it is doing better I still think she has a long ways to go to get things dried up in general. Fortunately there is no signs of systemic infection. 08/10/2019 upon evaluation today patient appears to be doing may be slightly better in regard to her left lower extremity the right lower extremity is doing much better. Fortunately there is no signs of infection right now which is good news. No fevers, chills, nausea, vomiting, or diarrhea. 08/24/2019 on evaluation today patient appears to be doing slightly better in regard to her lower extremities. The left lower extremity seems to be healed the right lower extremity is doing better though not completely healed as far as the openings are concerned. She has some generalized issues here with edema and weeping secondary to her lymphedema though again I do believe this is little bit drier compared to prior weeks evaluations. In general I am very pleased with how things seem to be progressing. No fevers, chills, nausea, vomiting, or diarrhea. 08/31/2019 upon evaluation today patient actually seems to making some progress here with regard to the left lower extremity in particular. She has been tolerating the dressing changes without complication. Fortunately there is no signs of active infection at this time. No fevers, chills, nausea, vomiting, or diarrhea. She did see Dr. Doren Custard and he did note that she did have a issue with the left great saphenous vein and the small saphenous vein in the leg. With that being said he was concerned about the possibility of laser ablation not being extremely successful. He also mentioned a small risk of DVT associated with the procedure. However if the wounds do not continue to improve he stated that that would probably be the way to go. Fortunately the patient's legs do seem to be doing much better. 09/07/2019  upon evaluation today patient appears to be doing better with regard to her lower extremities. She has been tolerating the dressing changes without complication. With that being said she is showing signs of improvement and overall very pleased. There are some areas on her leg that I think we do need to debride we discussed this last week the patient is  in agreement with doing that as long as it does not hurt too badly. 09/14/2019 upon evaluation today patient appears to be doing decently well with regard to her left lower extremity. She is not having near as much weeping as she has had in the past things seem to be drying up which is good news. There is no signs of active infection at this time. 09/21/19 upon evaluation today patient appears to be doing better in regard overall to her bilateral lower extremities. She again has less open than she did previous and each week I feel like this is getting better. Fortunately there is no signs of active infection at this time. No fevers, chills, nausea, vomiting, or diarrhea. 09/28/2019 upon evaluation today patient actually appears to be showing signs of improvement with regard to her left lower extremity. Unfortunately the right medial lower extremity around the ankle region has reopened to some degree but this appears to be minimal still which is good news. There is no signs of active infection at this time which is also good news. 10/12/2019 upon evaluation today patient appears to be doing okay with regard to her bilateral lower extremities today. The right is a little bit worse then last evaluation 2 weeks ago. The left is actually doing a little better in my opinion. Overall there is no signs of active infection at this time that I see. Obviously that something we have to keep a close eye on she is very prone to this with the significant and multiple openings that she has over the bilateral lower extremities. 10/19/2019 upon evaluation today patient appears  to be doing about the best that I have seen her in quite some time. She has been tolerating the dressing changes without complication. There does not appear to be any signs of active infection and overall I am extremely happy with the way her legs appeared. She is drying up quite nicely and overall is having less pain. 11/09/2019 upon evaluation today patient appears to be doing better in regard to her wounds. She seems to be drying up more and more each time I see her this is just taking a very long time. Fortunately there is no signs of active infection at this time. 11/23/2019 upon evaluation today patient actually appears to be doing excellent in regard to her lower extremities at this point compared to where she has been. Fortunately there is no signs of active infection at this time. She did go to the hospital last week for nausea and vomiting completely unrelated to her wounds. Fortunately she is doing better she was given some Reglan and got better. She had associated abdominal pain but they never found out what was going on. 12/07/2019 upon evaluation today patient appears to be doing well for the most part in regard to her legs. She unfortunately has not been keeping the [place] portion of her wraps on therefore the compression has not really been sufficient for what it is supposed to be. Nonetheless she tells me that it just hurt too bad therefore she removed it. 12/21/2019 upon evaluation today patient actually appears to be doing quite well with regard to her legs. I do feel like she has been making progress which is great news and overall there is no signs of active infection at this time. No fevers, chills, nausea, vomiting, or diarrhea. 01/04/2020 upon evaluation today patient presents for follow-up concerning her lower extremity edema bilaterally. She still has open wounds she has not been using her  lymphedema pumps. She is also not been utilizing her compression wraps appropriately she tends  to unwrap them, take them off, or states that they hurt. Obviously the reason they hurt is because her legs start to swell but the issue is if she would use her compression/lymphedema pumps regularly she would not swell and she would have the pain. Nonetheless she has not even picked them up once honestly over the past several months and may be even as much as in the past year based on my opinion and what have seen. She tells me today that after last week when I talked about this with her specifically actually that was 2 weeks ago that she "forgot". 8//21 on evaluation today patient appears to be doing a little better in regard to her legs bilaterally. Fortunately there is no signs of active infection at this time. She tells me that she used her lymphedema pumps all of one time over the past 2 weeks since I last saw her. She tells me that she has been too busy in order to continue to use these. 02/01/2020 on evaluation today patient appears to be doing some better in regard to her wounds in general in her legs. We felt the right was healed although is not completely it does appear to be doing better she tells me she has been using her lymphedema pumps that she has had this six times since I last saw her. Obviously the more she does that the better she would do my opinion 02/15/2020 upon evaluation today patient appears to be doing about the same in regard to her legs. She tells me that she is pumping I'm still not sure how much she does to be perfectly honest. However even if she does a little bit here and there I guess that is better than nothing. Fortunately there is no sign of active infection at this time which is great news. No fevers, chills, nausea, vomiting, or diarrhea. 02/29/2020 on evaluation today patient actually appears to be doing quite well all things considered this week. She has been tolerating the dressing changes without complication. Fortunately there is no signs of active infection  at this time. No fevers, chills, nausea, vomiting, or diarrhea. 03/14/2020 upon evaluation today patient appears to be doing really about the same in regard to her legs. There is no signs of improvement overall and she as noted from home health does not appear to be elevating her legs he can get into her lift chair. There is too much stuff piled up on it the patient tells me. She also tells me she cannot really use her pumps effectively due to the fact that she cannot have any space to get them on. Finally she is also not really elevating her legs because she is not sleeping in her bed she is sleeping in her chair currently and again overall I think everything that she is done in combination has been exactly the wrong thing for what she needs for her legs. 04/04/2020 upon evaluation today patient appears to be doing well at this time with regard to her legs. She is actually been pumping, keeping her wraps on, and to be honest she seems to be doing dramatically better the right leg is excellent the left leg is also excellent and measuring much smaller than previous. 04/18/2020 upon evaluation today patient actually is continue to make good progress in regard to her lower extremities bilaterally. Everything is improving and less wet that has been in the  past overall I am extremely pleased with where things stand and I think that she is making great progress. The patient tells me she still continue to use her compression pumps 05/02/2020 on evaluation today patient appears to be doing well at this time in regard to her left leg which is showing signs of drying up. With that being said she does have a lot of lymphedema type crusty skin around the toes of her left foot and the right medial ankle which has opened at this point. Fortunately there is no signs of active infection systemically at this point or even locally for that matter. 05/23/2020 on evaluation today patient appears to be doing well with regard  to her lower extremities. Fortunately there is no signs of active infection at this time. No fever chills noted. She has been very depressed however she tells me. 06/06/2020 patient came in today for evaluation in regard to her bilateral lower extremity ulcerations. With that being said she came in feeling okay and actually laughing and joking around with the staff checking her in. Subsequently however she had a coughing spell and following the coughing spell it was a dramatic conversion from being jovial and joking around to being extremely short of breath her vital signs actually dropped in regard to her blood pressure from around 175 to down around 195 for systolic and from around 96 diastolic down to around 70. With that being said she also accompanied this with an increase in her respiratory rate which was also quite significant. Nonetheless I actually upon going into see her was extremely worried we called EMS to have her transported to the ER for further evaluation and treatment. She continued until EMS got here to be extremely short of breath even on 6 L of oxygen. Her oxygen saturation did come up to 99% but overall it was only 95 before which was not terrible she did feel like the oxygen helped her feel somewhat better however. She has a history of asthma but again even the coughing spell really should not have done this degree of alteration in her demeanor from where she was just before to after the coughing spell. She tells [place] however she has been feeling somewhat abnormal since Friday although she could not really pinpoint exactly what was going on. 06/27/2020 plan evaluation today patient appears to be doing okay in regard to her leg ulcers. She is really not showing a lot of improvement to be honest and she still has a lot of weeping. With that being said after I last saw her we called EMS to take her to the hospital she actually did not go she went to see her primary care provider who  according to the patient have not identified and read through the note states that everything checked out okay. Nonetheless the patient has continued to have bouts where she gets very short of breath for seemingly no reason whatsoever. That happened even the same night that I saw her last time. This was after she refused transport to the hospital. With that being said she also tells me that just getting dressed to come here is quite a chore which is putting on her shirt causing her to have to stop to catch her breath. Obviously this is not normal and I feel like there is something going on. She may need a referral to be seen by cardiology ASAP. She does see her primary care provider early next week she tells me 07/11/2020 upon evaluation today  patient's wounds again appear to be doing about the same she mainly has weeping of the bilateral lower extremities both are very swollen today she tells me she has been using her pumps but last time I saw her she told me she did not even have her pumps out that she had "packed them away because they had too much stuff in their house. Apparently her husband has been bringing stuff in from storage units. She then subsequently told me that she had so much stuff in her house that she has been staying up all night and did not go to bed till 7:00 this morning because she was cleaning up stuff. Nonetheless I am still unsure as to whether or not she is using the pumps based on what I am seeing I would think probably not. Nonetheless she has been keeping the compression wraps in place that is at least something 07/25/2020 upon evaluation today patient appears to be doing well currently in regard to her legs all things considered. I do not think she is doing any worse significantly although honestly I do not think she is doing a lot better either. There does not appear to be any signs of infection which is good news although she does have a lot of weeping. She is using her  pumps she tells me sporadically though she says that her husband is not a big fan of helping her to get the [place]. She also tells me is difficult because in their house. It would be easiest for her to use these as where her son is actually staying with her and living out at this point. Obviously I understand that she is having a lot of issues here and to be honest I do not really know what to say in that regard and the fact that I really cannot change the situations but I do believe she really needs to be using the lymphedema pumps to try to keep things under control here. Outside of that I think she is going to continue to have significant issues. I think the pumps coupled with good compression and elevation are the only things that are to help her at this point. 08/08/2020 upon evaluation today patient appears to be doing a little worse in regard to her legs in general. I think this may be due to some infection currently. With that being said I am going to go ahead and likely see about putting her on an antibiotic. Were also can obtain a culture today where she had some purulent drainage. 08/29/2020 upon evaluation today patient appears to be doing about the same in regard to her bilateral lower extremities. Unfortunately she is continuing to have significant issues here with edema and she has significant lymphedema. With that being said she is really not doing anything that she is supposed to be doing as far as elevation, compression, using lymphedema pumps or anything really for that matter. She does not use her pumps, is taken the wraps off shortly after having them put on as far as home health is concerned at least by the next day she tells me, and overall is not really elevating her legs she is telling me she has a lot to do as far as cleaning up in her house. Nonetheless this triad of noncompliance is leading to worsening not improving in regard to her legs. I had a very strong conversation with  her about this today again. 09/12/2020 on evaluation today patient appears to be doing poorly  with regard to her lower extremities bilaterally. Fortunately there does not appear to be any signs of active infection at this point. That is systemically. Locally there is some signs of pus coming from an area on her left leg. She has been on antibiotics recently though she is done with those currently. Nonetheless she tells me she has been having increased pain with the right leg this is extremely swollen as well. Fortunately there does not appear to be significant infection of the right leg though I think her pain is really as result of the increased swelling she has been declining the wrap on the right leg and the wounds here are getting significantly worse week by week. This obviously is not will be want to see. I discussed with the patient however if she does not use the wraps, use her pumps, and elevate her legs that she is not to get better. She has been sleeping in her motorized Hoveround which does not even allow her to elevate her legs at all. She tells me that she is also constantly "cleaning up as her husband told her that he wants to move back to [place]." Unfortunately this has been the narrative for the past several months in fact that she tells me that she has been having to clean up mass that was moved in from storage units into their home and that she does not really sleep and she never really elevates her legs. I explained to the patient today that if she is not can I do any of this that there is really nothing that I can do to help her. 09/26/20 upon evaluation today patient appears to be doing about the same in regard to her wounds. Fortunately there is no signs of active infection at this time. No fever chills noted. She has been tolerating the dressing changes without complication which is good news. With that being said I do think that she unfortunately is still having a lot of  pain but I think this is due to the swelling that really is not controlled and as I discussed with her last time she needs to be using her lymphedema pumps there is still in the box she has not even attempted to use those whatsoever. I am also not even certain she is taking her fluid pills as she states she may need to have "get a refill" she was seen with her husband at the appointment today. 10/24/2020 upon evaluation today patient appears to be doing decently well in regard to her legs all things considered. She has been tolerating the dressing changes without complication. Fortunately there is no signs of active infection at this time. No fevers, chills, nausea, vomiting, or diarrhea. With that being said the patient does appear to have some excessive thickened skin buildup currently that has been require some sharp debridement to clear this away. I am hopeful if we do this we will be able to get some of this area to dry out this otherwise trapping fluid underneath. Her son is present during the office visit today. She tells me she still been doing a lot as far as cleaning up and going through boxes she tells me she has been up for a long time already this morning. 5/25; patient presents for 2-week follow-up. She had 3 layer compression wrap placed with calcium alginate underneath. She reports tolerating the wraps well. She has home health that changes the wrap 2-3 times a week. She denies signs of infection. She  has almost finished her course of antibiotics. She uses her lymphedema pumps once weekly. 11/21/2020 upon evaluation today patient appears to be doing well with regard to her legs all things considered. Fortunately there is no signs of active infection at this time which is great news I do not see any evidence of infection and overall I think that she is definitely improved compared to where things did previous. Nonetheless I do think that she may benefit from removing some of the thicker skin  on the legs. Again we have attempted this in the past to some degree but I think that we might be able to more effectively do this of a clear some way and then potentially use a urea cream with salicylic acid to try to help clear some of this away. The patient is in agreement with the plan. Electronic Signature(s) Signed: 11/21/2020 3:25:28 PM By: [name] PA-C Entered By: [name] on 11/21/2020 15:25:28 -------------------------------------------------------------------------------- Physical Exam Details Patient Name: Date of Service: [name], [name] 11/21/2020 1:00 PM Medical Record Number: [id number] Patient Account Number: [id number] Date of Birth/Sex: Treating RN: [birth year] (70 y.o. [name] Primary Care Provider: [name] Other Clinician: Referring Provider: Treating Provider/Extender: Darlen Round in Treatment: 52 Constitutional Well-nourished and well-hydrated in no acute distress. Respiratory normal breathing without difficulty. Psychiatric this patient is able to make decisions and demonstrates good insight into disease process. Alert and Oriented x 3. pleasant and cooperative. Notes Upon inspection patient's wound bed actually showed signs of good granulation epithelization at this point. There does not appear to be any evidence of infection she still has a lot of very thick tissue noted over the skin in the lower extremities again this is secondary to the chronic lymphedema changes. Nonetheless I think we need to try to soften some of this up carefully and slowly so as not to cause infection but at the same time get some of this cleared away to improve her overall quality of her legs if we have any chance of getting this healed. Electronic Signature(s) Signed: 11/21/2020 3:26:18 PM By: [name] PA-C Entered By: [name] on 11/21/2020  15:26:18 -------------------------------------------------------------------------------- Physician Orders Details Patient Name: Date of Service: [name]. 11/21/2020 1:00 PM Medical Record Number: [id number] Patient Account Number: [id number] Date of Birth/Sex: Treating RN: 1950/10/30 (70 y.o. [name] Primary Care Provider: [name] Other Clinician: Referring Provider: Treating Provider/Extender: Darlen Round in Treatment: [id number] Verbal / Phone Orders: No Diagnosis Coding ICD-10 Coding Code Description E11.622 Type 2 diabetes mellitus with other skin ulcer I89.0 Lymphedema, not elsewhere classified I87.331 Chronic venous hypertension (idiopathic) with ulcer and inflammation of right lower extremity I87.332 Chronic venous hypertension (idiopathic) with ulcer and inflammation of left lower extremity L97.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 leg with fat layer exposed L97.822 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 Bathing/ Shower/ Hygiene May shower and wash wound with soap and water. - with dressing changes, wash both legs with wash cloth and soap and water with dressing changes Other Bathing/Shower/Hygiene Orders/Instructions: - Home health to wash legs with warn soapy water and a clean wash cloth with dressing  changes Edema Control - Lymphedema / SCD / Other Bilateral Lower Extremities Lymphedema Pumps. Use Lymphedema pumps on leg(s) 2-3 times a day for 45-60 minutes. If wearing any wraps or hose, do not remove them. Continue exercising as instructed. Elevate legs to the level of the heart or  above for 30 minutes daily and/or when sitting, a frequency of: - especially at night Avoid standing for long periods of time. Exercise regularly Home Health No change in wound care orders this week; continue Home Health for wound care. May utilize formulary equivalent dressing for wound treatment orders unless otherwise specified. - add urea cream to legs with dressing changes when available Dressing changes to be completed by [facility] [place] on Monday / Wednesday / Friday except when patient has scheduled visit at [facility]. Other Home Health Orders/Instructions: - [facility] Wound #61 - Lower Leg Wound Laterality: Left, Circumferential Peri-Wound Care: Sween Lotion (Moisturizing lotion) (Home Health) 3 x Per Week/30 Days Discharge Instructions: Apply moisturizing lotion to dry skin on legs Peri-Wound Care: Urea Cream 40%, tube, 1 (oz) 3 x Per Week/30 Days Discharge Instructions: apply to lower legs with dressing changes Prim Dressing: Maxorb Extra Calcium Alginate 2x2 in (Home Health) 3 x Per Week/30 Days ary Discharge Instructions: Apply calcium alginate to wound bed as instructed Secondary Dressing: ABD Pad, 8x10 (Home Health) 3 x Per Week/30 Days Discharge Instructions: Apply over primary dressing as directed. Secondary Dressing: Zetuvit Plus 4x4 in (Home Health) 3 x Per Week/30 Days Discharge Instructions: or equivalent extra absorbent pad.Apply over primary dressing as directed. Compression Wrap: ThreePress (3 layer compression wrap) (Home Health) 3 x Per Week/30 Days Discharge Instructions: Apply three layer compression as directed. Pad bend of ankle with foam or ABD pad. Wound #64 - Foot Wound Laterality: Dorsal, Left Peri-Wound Care: Sween Lotion (Moisturizing lotion) (Home Health) 3 x Per Week/30 Days Discharge Instructions: Apply moisturizing lotion to dry skin on legs Peri-Wound Care: Urea Cream 40%, tube, 1 (oz) 3 x Per Week/30 Days Discharge  Instructions: apply to lower legs with dressing changes Prim Dressing: Maxorb Extra Calcium Alginate 2x2 in (Home Health) 3 x Per Week/30 Days ary Discharge Instructions: Apply calcium alginate to wound bed as instructed Secondary Dressing: ABD Pad, 8x10 (Home Health) 3 x Per Week/30 Days Discharge Instructions: Apply over primary dressing as directed. Secondary Dressing: Zetuvit Plus 4x4 in (Home Health) 3 x Per Week/30 Days Discharge Instructions: or equivalent extra absorbent pad.Apply over primary dressing as directed. Compression Wrap: ThreePress (3 layer compression wrap) (Home Health) 3 x Per Week/30 Days Discharge Instructions: Apply three layer compression as directed. Pad bend of ankle with foam or ABD pad. Wound #67 - Lower Leg Wound Laterality: Right, Medial Peri-Wound Care: Sween Lotion (Moisturizing lotion) (Home Health) 3 x Per Week/30 Days Discharge Instructions: Apply moisturizing lotion to dry skin on legs Peri-Wound Care: Urea Cream 40%, tube, 1 (oz) 3 x Per Week/30 Days Discharge Instructions: apply to lower legs with dressing changes Prim Dressing: Maxorb Extra Calcium Alginate 2x2 in (Home Health) 3 x Per Week/30 Days ary Discharge Instructions: Apply calcium alginate to wound bed as instructed Secondary Dressing: ABD Pad, 8x10 (Home Health) 3 x Per Week/30 Days Discharge Instructions: Apply over primary dressing as directed. Secondary Dressing: Zetuvit Plus 4x4 in (Home Health) 3 x Per Week/30 Days Discharge Instructions: or equivalent extra absorbent pad.Apply over primary dressing as directed. Compression Wrap: ThreePress (3 layer compression wrap) (Home Health) 3 x Per Week/30 Days Discharge Instructions: Apply three  layer compression as directed. Pad bend of ankle with foam or ABD pad. Electronic Signature(s) Signed: 11/21/2020 5:40:25 PM By: [name] PA-C Signed: 11/21/2020 6:12:56 PM By: [name] RN, BSN Entered By: [name] on 11/21/2020  15:23:00 -------------------------------------------------------------------------------- Problem List Details Patient Name: Date of Service: [name]. 11/21/2020 1:00 PM Medical Record Number: [id number] Patient Account Number: [id number] Date of Birth/Sex: Treating RN: 04/15/1951 (70 y.o. Martyn Malay, Vaughan Basta Primary Care Provider: [name] Other Clinician: Referring Provider: Treating Provider/Extender: Darlen Round in Treatment: [phone] Active Problems ICD-10 Encounter Code Description Active Date MDM Diagnosis E11.622 Type 2 diabetes mellitus with other skin ulcer 08/05/2017 No Yes I89.0 Lymphedema, not elsewhere classified 08/05/2017 No Yes I87.331 Chronic venous hypertension (idiopathic) with ulcer and inflammation of right 08/05/2017 No Yes lower extremity I87.332 Chronic venous hypertension (idiopathic) with ulcer and inflammation of left 08/05/2017 No Yes lower extremity L97.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 leg with fat layer 08/05/2017 No Yes exposed L97.822 Non-pressure chronic ulcer of other part of left lower leg with fat layer exposed2/20/2019 No Yes L97.522 Non-pressure chronic ulcer of other part of left foot with fat layer exposed 06/01/2019 No Yes I10 Essential (primary) hypertension 08/05/2017 No Yes E66.01 Morbid (severe) obesity due to excess calories 08/05/2017 No Yes F41.8 Other specified anxiety disorders 08/05/2017 No Yes R53.1 Weakness 08/05/2017 No Yes Inactive Problems Resolved Problems Electronic Signature(s) Signed: 11/21/2020 2:39:45 PM By: [name] PA-C Entered By: [name] on 11/21/2020 14:39:45 -------------------------------------------------------------------------------- Progress Note Details Patient Name: Date of Service: [name]. 11/21/2020 1:00 PM Medical Record Number: [id number] Patient Account Number: [id number] Date of Birth/Sex: Treating RN: June 06, 1951 (70 y.o. [name] Primary Care Provider: [name] Other Clinician: Referring Provider: Treating Provider/Extender: Darlen Round in Treatment: 172 Subjective Chief Complaint Information obtained from Patient Bilateral reoccurring LE ulcers History of Present Illness (HPI) this patient has been seen a couple of times before and returns with recurrent problems to her right and left lower extremity with swelling and weeping ulcerations due to not wearing her compression stockings which she had been advised to do during her last discharge, at the end of June 2018. During her last visit the patient had had normal arterial blood flow and her venous reflux study did not necessitate any surgical intervention. She was recommended compression and elevation and wound care. After prolonged treatment the patient was completely healed but she has been noncompliant with wearing or compressions.. She was here last week with an outpatient return visit planned but the patient came in a very poor general condition with altered mental status and was rushed to the ER on my request. With a history of hypertension, diabetes, TIA and right-sided weakness she was set up for an MRI on her brain and cervical spine and was sent to [facility]. Getting an MRI done was very difficult but once the workup was done she was found not to have any spinal stenosis, epidural abscess or hematoma or discitis. This was radiculopathy to be treated as an outpatient and she was given a follow-up appointment. Today she is feeling much better alert and oriented and has come to reevaluate her bilateral lower extremity lymphedema and ulceration 03/25/2017 -- she was admitted to the hospital on 03/16/2017 and discharged on 03/18/2017 with left leg cellulitis and ulceration. She was started on vancomycin and Zosyn and x-ray showed no bony involvement. She was treated for a  cellulitis with IV antibiotics changed to Rocephin  and Flagyl and was discharged on oral Keflex and doxycycline to complete a 7 day course. Last hemoglobin A1c was 7.1 and her other ailments including hypertension got asthma were appropriately treated. 05/06/2017 -- she is awaiting the right size of compression stockings from [facility] but other than that has been doing well. ====== Old notes 70 year old patient was seen one time last October and was lost to follow-up. She has recurrent problems with weeping and ulceration of her left lower extremity and has swelling of this for several years. It has been worse for the last 2 months. Past medical history is significant for diabetes mellitus type 2, hypertension, gout, morbid obesity, depressive disorders, hiatal hernia, migraines, status post knee surgery, risk of a cholecystectomy, vaginal hysterectomy and breast biopsy. She is not a smoker. As noted before she has never had a venous duplex study and an arterial ABI study was attempted but the left lower extremity was noncompressible 10/01/2016 -- had a lower extremity venous duplex reflux evaluation which showed no evidence of deep vein reflux in the right or left lower extremity, and no evidence of great saphenous vein reflux more than 500 ms in the right or left lower extremity, and the left small saphenous vein is incompetent but no vascular consult was recommended. review of her electronic medical records noted that the ABI was checked in July 2017 where the right ABI was normal limits and the left ABI could not be ascertained due to pain with cuff pressure but the waveforms are within normal limits. her arterial duplex study scheduled for April 27. 10/08/2016 -- the patient has various reasons for not having a compression on and for the last 3 days she has had no compression on her left lower extremity either due to pain or the lack of nursing help. She does not use her juxta lites either. 10/15/2016 -- the patient did not keep her  appointment for arterial duplex study on April 27 and I have asked her to reschedule this. Her pain is out of proportion with the physical findings and she continuously fails to wear a compression wraps and cuts them off because she says she cannot tolerate the pain. She does not use her juxta lites either. 10/22/2016 -- he has rescheduled her arterial duplex study to May 21 and her pain today is a bit better. She has not been wearing her juxta lites on her right lower extremity but now understands that she needs to do this. She did tolerate the to press compression wrap on her left lower extremity 10/29/2016 --arterial duplex study is scheduled for next week and overall she has been tolerating her compression wraps and also using her juxta lites on her right lower extremity 11/05/2016 -- the right ABI was 0.95 the left was 1.03. The digit TBI is on the right was 0.83 on the left was 0.92 and she had biphasic flow through these vessels. The impression was that of normal lower extremity arterial study. 11/12/2016 -- her pain is minimal and she is doing very well overall. 11/26/2016 -- she has got juxta lites and her insurance will not pay for additional dual layer compression stockings. She is going to order some from [facility]. 05/12/2017 -- her juxta lites are very old and too big for her and these have not been helping with compression. She did get 20-30 mm compression stockings from [place] but she and her husband are unable to put these on. I believe she  will benefit from bilateral Extremit-ease, compression stockings and we will measure her for these today. 05/20/2017 -- lymphedema on the left lower extremity has increased a lot and she has a open ulceration as a result of this. The right lower extremity is looking pretty good. She has decided to by the compression stockings herself and will get reimbursed by the home health, at a later date. 05/27/2017 -- her sciatica is bothering her a lot and  she thought her left leg pain was caused due to the compression wrap and hence removed it and has significant lymphedema. There is no inflammation on this left lower extremity. 06/17/17 on evaluation today patient appears to be doing very well and in fact is completely healed in regard to her ulcerations. Unfortunately however she does have continued issues with lymphedema nonetheless. We did order compression garments for her unfortunately she states that the size that she received were large although we ordered medium. Obviously this means she is not getting the optimal compression. She does not have those with her today and therefore we could not confirm and contact the company on her behalf. Nonetheless she does state that she is going to have her husband bring them by tomorrow so that we can verify and then get in touch with the company. No fevers, chills, nausea, or vomiting noted at this time. Overall patient is doing better otherwise and I'm pleased with the progress she has made. 07/01/17 on evaluation today patient appears to be doing very well in regard to her bilateral lower extremity she does not have any openings at this point which is excellent news. Overall I'm pleased with how things have progressed up to this time. Since she is doing so well we did order her compression which we are seeing her today to ensure that it fits her properly and everything is doing well in that regard and then subsequently she will be discharged. ============ Old Notes: 03/31/16 patient presents today for evaluation concerning open wounds that she has over the left medial ankle region as well as the left dorsal foot. She has previously had this occur although it has been healed for a number of months after having this for about a year prior until her hospitalization on 01/05/16. At that point in time it appears that she was admitted to the hospital for left lower extremity cellulitis and was placed on  vancomycin and Zosyn at that point. Eventually upon discharge on January 15, 2016 she was placed on doxycycline at that point in time. Later on 03/27/16 positive wound culture growing Escherichia coli this was switched to amoxicillin. Currently she tells me that she is having pain radiated to be a 7 out of 10 which can be as high as 10 out of 10 with palpation and manipulation of the wound. This wound appears to be mainly venous in nature due to the bilateral lower extremity venous stasis/lymphedema. This is definitely much worse on her left than the right side. She does have type 1 diabetes mellitus, hypertension, morbid obesity, and is wheelchair dependent.during the course of the hospital stay a blood culture was also obtained and fortunately appeared negative. She also had an x-ray of the tibia/fibula on the left which showed no acute bone abnormality. Her white blood cell count which was performed last on 03/25/16 was 7.3, hemoglobin 12.8, protein 7.1, albumin 3.0. Her urine culture appeared to be negative for any specific organisms. Patient did have a left lower extremity venous duplex evaluation for DVT .  This did not include venous reflux studies but fortunately was negative for DVT Patient also had arterial studies performed which revealed that she had a . normal ABI on the right though this was unable to be performed on the left secondary to pain that she was having around the ankle region due to the wound. However it was stated on report that she had biphasic pulses and apparently good blood flow. ========== 06/03/17 she is here in follow-up evaluation for right lower extremity ulcer. The right lower sure he has healed but she has reopened to the left medial malleolus and dorsal foot with weeping. She is waiting for new compression garments to arrive from home health, the previous compression garments were ill fitting. We will continue with compression bilaterally and follow-up in 2  weeks Readmission: 08/05/17 on evaluation today patient appears to be doing somewhat poorly in regard to her left lower extremity especially although the right lower extremity has a small area which may no longer be open. She has been having a lot of drainage from the left lower extremity however he tells me that she has not been able to use the EXTREMIT-EASE Compression at this point. She states that she did better and was able to actually apply the Juxta-Lite compression although the wound that she has is too large and therefore really does not compress which is why she cannot wear it at this point. She has no one who can help her put it on regular basis her son can sometimes but he's not able to do it most of the time. I do believe that's why she has begun to weave and have issues as she is currently yet again. No fevers, chills, nausea, or vomiting noted at this time. Patient is no evidence of dementia. 08/12/17 on evaluation today patient appears to still be doing fairly well in regard to the draining areas/weeping areas at this point. With that being said she unfortunately did go to the ER yesterday due to what was felt to be possibly a cellulitis. They place her on doxycycline by mouth and discharge her home. She definitely was not admitted. With that being said she states she has had more discomfort which has been unusual for her even compared to prior times and she's had infections.08/12/17 on evaluation today patient appears to still be doing fairly well in regard to the draining areas/weeping areas at this point. With that being said she unfortunately did go to the ER yesterday due to what was felt to be possibly a cellulitis. They place her on doxycycline by mouth and discharge her home. She definitely was not admitted. With that being said she states she has had more discomfort which has been unusual for her even compared to prior times and she's had infections. 08/19/17 put evaluation today  patient tells me that she's been having a lot of what sounds to be neuropathic type pain in regard to her left lower extremity. She has been using over-the-counter topical bins again which some believe. That in order to apply the she actually remove the wrap we put on her last Wednesday on Thursday. Subsequently she has not had anything on compression wise since that time. The good news is a lot of the weeping areas appear to have closed at this point again I believe she would do better with compression but we are struggling to get her to actually use what she needs to at this point. No fevers, chills, nausea, or vomiting noted at this  time. 09/03/17 on evaluation today patient appears to be doing okay in regard to her lower extremities in regard to the lymphedema and weeping. Fortunately she does not seem to show any signs of infection at this point she does have a little bit of weeping occurring in the right medial malleolus area. With that being said this does not appear to be too significant which is good news. 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 She has severe skin damage secondary to both of these features involving the dorsal left foot and medial left ankle and lower leg. Still has open areas in the left anterior foot. The area on the right closed over. She uses her own juxta light stockings. She does not have an arterial issue 09/16/17 on evaluation today patient actually appears to be doing excellent in regard to her bilateral lower extremity swelling. The Juxta-Lite compression wrap seem to be doing very well for her. She has not however been using the portion that goes over her foot. Her left foot still is draining a little bit not nearly as significant as it has been in the past but still I do believe that she likely needs to utilize the full wrap including the foot portion of this will improve as well. She also has  been apparently putting on a significant amount of Vaseline which also think is not helpful for her. I recommended that if she feels she needs something for moisturizer Eucerin will probably be better. 09/30/17 on evaluation today patient presents with several new open areas in regard to her left lower extremity although these appear to be minimal and mainly seem to be more moisture breakdown than anything. Fortunately she does not seem to have any evidence of infection which is great news. She has been tolerating the dressing changes without complication we are using silver alginate on the foot she has been using AB pads to have the legs and using her Juxta- Lite compression which seems to be controlling her swelling very well. Overall I'm pleased with the poor way she has progressed. 10/14/17 on evaluation today patient appears to be doing better in regard to her left lower extremity areas of weeping. She does still have some discomfort although in general this does not appear to be as macerated and I think it is progressing nicely. I do think she still needs to wear the foot portion of her Juxta- Lite in order to get the most benefit from the wrap obviously. She states she understands. Fortunately there does not appear to be evidence of infection at this time which is great news. 10/28/17 on evaluation today patient appears to be doing excellent in regard to her left lower extremity. She has just a couple areas that are still open and seem to be causing any trouble whatsoever. For that reason I think that she is definitely headed in the right direction the spots are very tiny compared to what we have been dealing with in the past. 11/11/17 on evaluation today patient appears to have a right lateral lower extremity ulcer that has opened since I last saw her. She states this is where the home health nurse that was coming out remove the dressing without wetting the alginate first. Nonetheless I do not know  if this is indeed the case or not but more importantly we have not ordered home help to be coming out for her wounds at all. I'm unsure as to why they  are coming out and we're gonna have to check on this and get things situated in that regard. With that being said we currently really do not need them to be coming out as the patient has been taking care of her leg herself without complication and no issues. In fact she was doing much better prior to nursing coming out. 11/25/17 on evaluation today patient actually appears to be doing fairly well in regard to her left lower extremity swelling. In fact she has very little area of weeping at this point there's just a small spot on the lateral portion of her right leg that still has me just a little bit more concerned as far as wanting to see this clear up before I discharge her to caring for this at home. Nonetheless overall she has made excellent progress. 12/09/17 on evaluation today patient appears to be doing rather well in regard to her lower extremity edema. She does have some weeping still in the left lower extremity although the big area we were taking care of two weeks ago actually has closed and she has another area of weeping on the left lower extremity immediately as well is the top of her foot. She does not currently have lymphedema pumps she has been wearing her compression daily on a regular basis as directed. With that being said I think she may benefit from lymphedema pumps. 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 We had a very difficult time getting and keeping this under control. 12/23/17 on evaluation today patient actually appears to be doing a little bit more poorly in regard to her bilateral lower extremities. She has been tolerating the Juxta-Lite compression wraps. Unfortunately  she has two new ulcers on the right lower extremity and left lower Trinity ulceration seems to be larger. Obviously this is not good news. She has been tolerating the dressings without complication. 12/30/17 on evaluation today patient actually appears to be doing much better in regard to her bilateral lower extremity edema. She continues to have some issues with ulcerations and in fact there appears to be one spot on each leg where the wrap may have caused a little bit of a blister which is subsequently opened up at this point is given her pain. Fortunately it does not appear to be any evidence of infection which is good news. No fevers chills noted. 01/13/18 on evaluation today patient appears to be doing rather well in regard to her bilateral lower extremities. The dressings did get kind of stuck as far as the wound beds are concerned but again I think this is mainly due to the fact that she actually seems to be showing signs of healing which is good news. She's not having as much drainage therefore she was having more of the dressing sticking. Nonetheless overall I feel like her swelling is dramatically down compared to previous. 01/20/18 on evaluation today patient unfortunately though she's doing better in most regards has a large blister on the left anterior lower extremity where she is draining quite significantly. Subsequently this is going to need debridement today in order to see what's underneath and ensure she does not continue to trapping fluid at this location. Nonetheless No fevers, chills, nausea, or vomiting noted at this time. 01/27/18 on evaluation today patient appears to be doing rather well at this point in  regard to her right lower extremity there's just a very small area that she still has open at this point. With that being said I do believe that she is tolerating the compression wraps very well in making good progress. Home health is coming out at this point to see her. Her left  lower extremity on the lateral portion is actually what still mainly open and causing her some discomfort for the most part 02/10/18 on evaluation today patient actually appears to be doing very well in regard to her right lower extremity were all the ulcers appear to be completely close. In regard to the left lower extremity she does have two areas still open and some leaking from the dorsal surface of her foot but this still seems to be doing much better to me in general. 02/24/18 on evaluation today patient actually appears to be doing much better in regard to her right lower extremity this is still completely healed. Her left lower extremity is also doing much better fortunately she has no evidence of infection. The one area that is gonna require some debridement is still on the left anterior shin. Fortunately this is not hurting her as badly today. 03/10/18 on evaluation today patient appears to be doing better in some regards although she has a little bit more open area on the dorsal foot and she also has some issues on the medial portion of the left lower extremity which is actually new and somewhat deep. With that being said there fortunately does not appear to be any significant signs of infection which is good news. No fevers, chills, nausea, or vomiting noted at this time. In general her swelling seems to be doing fairly well which is good news. 03/31/18 on evaluation today patient presents for follow-up concerning her left lower extremity lymphedema. Unfortunately she has been doing a little bit more poorly since I last saw her in regard to the amount of weeping that she is experiencing. She's also having some increased pain in the anterior shin location. Unfortunately I do not feel like the patient is making such good progress at this point a few weeks back she was definitely doing much better. 04/07/18 on evaluation today patient actually appears to be showing some signs of improvement as  far as the left lower extremity is concerned. She has been tolerating the dressing changes and it does appear that the Drawtex did better for her. With that being said unfortunately home health is stating that they cannot obtain the Drawtex going forward. Nonetheless we're gonna have to check and see what they may be able to get the alginate they were using was getting stuck in causing new areas of skin being pulled all that with and subsequently weep and calls her to worsen overall this is the first time we've seen improvement at this time. 04/14/18 on evaluation today patient actually appears to be doing rather well at this point there does not appear to be any evidence of infection at this time and she is actually doing excellent in regard to the weeping in fact she almost has no openings remaining even compared to just last week this is a dramatic improvement. No fevers chills noted 04/21/18 evaluation today patient actually appears to be doing very well. She in fact is has a small area on the posterior lower extremity location and she has a small area on the dorsal surface of her foot that are still open both of which are very close to closing. We're  hoping this will be close shortly. She brought her Juxta-Lite wrap with her today hoping that would be able to put her in it unfortunately I don't think were quite at that point yet but we're getting closer. 04/28/18 upon evaluation today patient actually appears to be doing excellent in regard to her left lower extremity ulcer. In fact the region on the posterior lower extremity actually is much smaller than previously noted. Overall I'm very happy with the progress she has made. She again did bring her Juxta-Lite although we're not quite ready for that yet. 05/11/18 upon evaluation today patient actually appears to be doing in general fairly well in regard to her left lower Trinity. The swelling is very well controlled. With that being said she has  a new area on the left anterior lower extremity as well as between the first and second toes of her left foot that was not present during the last evaluation. The region of her posterior left lower extremity actually appears to be almost completely healed. T be honest I'm very pleased with o the way that stands. Nonetheless I do believe that the lotion may be keeping the area to moist as far as her legs are concerned subsequently I'm gonna consider discontinuing that today. 05/26/18 on evaluation today patient appears to be doing rather well in regard to her left lower should be ulcers. In fact everything appears to be close except for a very small area on the left posterior lower extremity. Fortunately there does not appear to be any evidence of infection at this time. Overall very pleased with her progress. 06/02/18 and evaluation today patient actually appears to be doing very well in regard to her lower extremity ulcers. She has one small area that still continues to weep that I think may benefit her being able to justify lotion and user Juxta-Lite wraps versus continued to wrap her. Nonetheless I think this is something we can definitely look into at this point. 06/23/18 on evaluation today patient unfortunately has openings of her bilateral lower extremities. In general she seems to be doing much worse than when I last saw her just as far as her overall health standpoint is concerned. She states that her discomfort is mainly due to neuropathy she's not having any other issues otherwise. No fevers, chills, nausea, or vomiting noted at this time. 06/30/18 on evaluation today patient actually appears to be doing a little worse in regard to her right lower extremity her left lower extremity of doing fairly well. Fortunately there is no sign of infection at this time. She has been tolerating the dressing changes without complication. Home health did not come out like they were supposed to for the  appropriate wrap changes. They stated that they never received the orders from [place] which were fax. Nonetheless we will send a copy of the orders with the patient today as well. 07/07/18 on evaluation today patient appears to be doing much better in regard to lower extremities. She still has several openings bilaterally although since I last saw her her legs did show obvious signs of infection when she later saw her nurse. Subsequently a culture was obtained and she is been placed on Bactrim and Keflex. Fortunately things seem to be looking much better it does appear she likely had an infection. Again last week we'd even discussed it but again there really was not any obvious sign that she had infection therefore we held off on the antibiotics. Nonetheless I'm glad she's doing better  today. 07/14/18 on evaluation today patient appears to be doing much better regarding her bilateral lower Trinity's. In fact on the right lower for me there's nothing open at this point there are some dry skin areas at the sites where she had infection. Fortunately there is no evidence of systemic infection which is excellent news. No fevers chills noted 07/21/18 on evaluation today patient actually appears to be doing much better in regard to her left lower extremity ulcers. She is making good progress and overall I feel like she's improving each time I see her. She's having no pain I do feel like the infection is completely resolved which is excellent news. No fevers, chills, nausea, or vomiting noted at this time. 07/28/18 on evaluation today patient appears to be doing very well in regard to her left lower [employer]. Everything seems to be showing signs of improvement which is excellent news. Overall very pleased with the progress that has been made. Fortunately there's no evidence of active infection at this time also excellent news. 08/04/18 on evaluation today patient appears to be doing more poorly in regard to her  bilateral lower extremities. She has two new areas open up on the right and these were completely closed as of last week. She still has the two spots on the left which in my pinion seem to be doing better. Fortunately there's no evidence of infection again at this point. 08/11/18 on evaluation today patient actually appears to be doing very well in regard to her bilateral lower Trinity wounds that all seem to be doing better and are measures smaller today. Fortunately there's no signs of infection. No fevers, chills, nausea, or vomiting noted at this time. 08/18/18 on evaluation today patient actually appears to be doing about the same inverter bilateral lower extremities. She continues to have areas that blistering open as was drain that fortunately nothing too significant. Overall I feel like Drawtex may have done better for her however compared to the collagen. 08/25/18 on evaluation today patient appears to be doing a little bit more poorly today even compared to last time I saw her. Again I'm not exactly sure why she's making worse progress over the past several weeks. I'm beginning to wonder if there is some kind of underlying low level infection causing this issue. I did actually take a culture from the left anterior lower extremity but it was a new wound draining quite a bit at this point. Unfortunately she also seems to be having more pain which is what also makes me worried about the possibility of infection. This is despite never erythema noted at this point. 09/01/18 on evaluation today patient actually appears to be doing a little worse even compared to last week in regard to bilateral lower extremities. She did go to the hospital on the 16th was given a dose of IV Zosyn and then discharged with a recommendation to continue with the Bactrim that I previously prescribed for her. Nonetheless she is still having a lot of discomfort she tells me as well at this time. This is definitely unfortunate.  No fevers, chills, nausea, or vomiting noted at this time. 09/08/18 on evaluation today patient's bilateral lower extremities actually appear to be shown signs of improvement which is good news. Fortunately there does not appear to be any signs of active infection I think the anabiotic is helping in this regard. Overall I'm very pleased with how she is progressing. 09/15/18 patient was actually seen in ER yesterday due to  her legs as well unfortunately. She states she's been having a lot of pain and discomfort as well as a lot of drainage. Upon inspection today the patient does have a lot of swelling and drainage I feel like this is more related to lymphedema and poor fluid control than it is to infection based on what I'm seeing. The physician in the emergency department also doubted that the patient was having a significant infection nonetheless I see no evidence of infection obvious at this point although I do see evidence of poor fluid control. She still not using a compression pumps, she is not elevating due to her lift chair as well as her hospital bed being broken, and she really is not keeping her legs up as much as they should be and also has been taking off her wraps. All this combined I think has led to poor fluid control and to be honest she may be somewhat volume overloaded in general as well. I recommend that she may need to contact your physician to see if a prescription for a diuretic would be beneficial in their opinion. As long as this is safe I think it would likely help her. 09/29/18 on evaluation today patient's left lower extremity actually appears to be doing quite a bit better. At least compared to last time that I saw her. She still has a large area where she is draining from but there's a lot of new skin speckled trout and in fact there's more new skin that there are open areas of weeping and drainage at this point. This is good news. With regard to the right lower extremity this  is doing much better with the only open area that I really see being a dry spot on the right lateral ankle currently. Fortunately there's no signs of active infection at this time which is good news. No fevers, chills, nausea, or vomiting noted at this time. The patient seems somewhat stressed and overwhelmed during the visit today she was very lethargic as such. She does and she is not taking any pain medications at this point. Apparently according to her husband are also in the process of moving which is probably taking its toll on her as well. 10/06/18 on evaluation today patient appears to be doing rather well in regard to her lower extremities compared to last evaluation. Fortunately there's no signs of active infection. She tells me she did have an appointment with her primary. Nonetheless he was concerned that the wounds were somewhat deep based on pictures but we never actually saw her legs. She states that he had her somewhat worried due to the fact that she was fearing now that she was [place] have to have an amputation. With that being said based on what I'm seeing check she looks better this week that she has the last two times I've seen her with much less drainage I'm actually pleased in this regard. That doesn't mean that she's out of the water but again I do not think what the point of talking about education at all in regard to her leg. She is very happy to hear this. She is also not having as much pain as she was having last week. 10/13/18 unfortunately on evaluation today patient still continues to have a significant amount of drainage she's not letting home health actually apply the compression dressings at this point. She's trying to use of Juxta-Lite of the top of Kerlex and the second layer of the three layer compression  wrap. With that being said she just does not seem to be making as good a progress as I would expect if she was having the compression applied and in place on a  regular basis. No fevers, chills, nausea, or vomiting noted at this time. 10/20/18 on evaluation today patient appears to be doing a little better in regard to her bilateral lower extremity ulcers. In fact the right lower extremity seems to be healed she doesn't even have any openings at this point left lower extremity though still somewhat macerated seems to be showing signs of new skin growth at multiple locations throughout. Fortunately there's no evidence of active infection at this time. No fevers, chills, nausea, or vomiting noted at this time. 10/27/18 on evaluation today patient appears to be doing much better in regard to her left lower Trinity ulcer. She's been tolerating the laptop complication and has minimal drainage noted at this point. Fortunately there's no signs of active infection at this time. No fevers, chills, nausea, or vomiting noted at this time. 11/03/18 on evaluation today patient actually appears to be doing excellent in regard to her left lower extremity. She is having very little drainage at this point there does not appear to be any significant signs of infection overall very pleased with how things have gone. She is likewise extremely pleased still and seems to be making wonderful progress week to week. I do believe antibiotics were helpful for her. Her primary care provider did place on amateur clean since I last saw her. 11/17/18 on evaluation today patient appears to be doing worse in regard to her bilateral lower extremities at this point. She is been tolerating the dressing changes without complication. With that being said she typically takes the Coban off fairly quickly upon arriving home even after being seen here in the clinic and does not allow home health reapply command as part of the dressing at home. Therefore she said no compression essentially since I last saw her as best I can tell. With that being said I think it shows and how much swelling she has in the  open wounds that are noted at this point. Fortunately there's no signs of infection but unfortunately if she doesn't get this under control I think she will end up with infection and more significant issues. 11/24/18 on evaluation today patient actually appears to be doing somewhat better in regard to her bilateral lower extremities. 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 Nonetheless that is doing better currently so she should be able to attempt reinitiating the lymphedema pumps at this time. No fevers, chills, nausea, or vomiting noted at this time. 12/01/18 upon evaluation today patient's left lower extremity appears to be doing quite well unfortunately her right lower extremity is not doing nearly as well. She has been tolerating the dressing changes without complication unfortunately she did not keep a wrap on the right at this time. Nonetheless I believe this has led to increased swelling and weeping in the world is actually much larger than during the last evaluation with her. 12/08/18 on evaluation today patient appears to be doing about the same at this point in regard to her right lower extremity. There is some more palatable to touch I'm concerned about the possibility of there being some infection although I think the  main issue is she's not keeping her compression wrap on which in turn is not allowing this area to heal appropriately. 12/22/18 on evaluation today patient appears to be doing better in regard to left lower extremity unfortunately significantly worse in regard to the right lower extremity. The areas of blistering and necrotic superficial tissue have spread and again this does not really appear to be signs of infection and all she just doesn't seem to be doing nearly as well is what she has been in the past. Overall I feel like the  Augmentin did absolutely nothing for her she doesn't seem to have any infection again I really didn't think so last time either is more of a potential preventative measure and hoping that this would make some difference but I think the main issue is she's not wearing her compression. She tells me she cannot wear the [place] been we put on she takes it off pretty much upon getting home. Subsequently she worshiped Juxta-Lite when I questioned her about how often she wears it this is no more than three hours a day obviously that leaves 21 hours that she has no compression and this is obviously not doing well for her. Overall I'm concerned that if things continue to worsen she is at great risk of both infection as well as losing her leg. 01/05/19 on evaluation today patient appears to be doing well in regard to her left lower extremity which he is allowing [place] to wrap and not so well with regard to her right lower extremity which she is not allowing [place] to really wrap and keep the wrap on. She states that it hurts too badly whenever it's wrapped and she ends up having to take it off. She's been using the Juxta-Lite she tells me up to six hours a day although I question whether or not that's really been the case to be honest. Previously she told me three hours today nonetheless obviously the legs as long as the wrap is doing great when she is not is doing much more poorly. 01/12/2019 on evaluation today patient actually appears to be doing a little better in my opinion with regard to her right lower extremity ulcer. She has a small open area on the left lower extremity unfortunately but again this I think is part of the normal fluctuation of what she is going to have to expect with regard to her legs especially when she is not using her lymphedema pumps on a regular basis. Subsequently based on what I am seeing today I think that she does seem to be doing slightly better with regard to her right lower extremity  she did see her primary care provider on Monday they felt she had an infection and placed her on 2 antibiotics. Both Cipro and clindamycin. Subsequently again she seems possibly to be doing a little bit better in regards to the right lower extremity she also tells me however she has been wearing the compression wrap over the past week since I spoke with her as well that is a Kerlix and Coban wrap on the right. No fevers, chills, nausea, vomiting, or diarrhea. 01/19/2019 on evaluation today patient appears to be doing better with regard to her bilateral lower extremities especially the right. I feel like the compression has been beneficial for her which is great news. She did get a call from her primary care provider on her way here today telling her that she did have methicillin- resistant Staphylococcus aureus and he  was calling in a couple new antibiotics for her including a ointment to be applied she tells me 3 times a day. With that being said this sounds like likely to be Bactroban which I think could be applied with each dressing/wrap change but I would not be able to accommodate her applying this 3 times a day. She is in agreement with the least doing this we will add that to her orders today. 01/26/2019 on evaluation today patient actually appears to be doing much better with regard to her right lower extremity. Her left lower extremity is also doing quite well all things considering. Fortunately there is no evidence of active infection at this time. No fevers, chills, nausea, vomiting, or diarrhea. 02/02/2019 on evaluation today patient appears to be doing much better compared to her last evaluation. Little by little off like her right leg is returning more towards normal. There does not appear to be any signs of active infection and overall she seems to be doing quite well which is great news. I am very pleased in this regard. No fevers, chills, nausea, vomiting, or diarrhea. 02/09/2019 upon  evaluation today patient appears to be doing better with regard to her bilateral lower extremities. She has been tolerating the dressing changes without complication. Fortunately there is no signs of active infection at this time. No fevers, chills, nausea, vomiting, or diarrhea. 02/23/2019 on evaluation today patient actually appears to be doing quite well with regard to her bilateral lower extremities. She has been tolerating the dressing changes without complication. She is even used her pumps one time and states that she really felt like it felt good. With that being said she seems to be in good spirits and her legs appear to be doing excellent. 03/09/2019 on evaluation today patient appears to be doing well with regard to her right lower extremity there are no open wounds at this time she is having some discomfort but I feel like this is more neuropathy than anything. With regard to her left lower extremity she had several areas scattered around that she does have some weeping and drainage from but again overall she does not appear to be having any significant issues and no evidence of infection at this time which is good news. 03/23/2019 on evaluation today patient appears to be doing well with regard to her right lower extremity which she tells me is still close she is using her juxta light here. Her left lower extremity she mainly just has an area on the foot which is still slightly draining although this also is doing great. Overall very pleased at this time. 04/06/2019 patient appears to be doing a little bit worse in regard to her left lower extremity upon evaluation today. She feels like this could be becoming infected again which she had issues with previous. Fortunately there is no signs of systemic infection but again this is always a struggle with her with her legs she will go from doing well to not so well in a very short amount of time. 04/20/2019 on evaluation today patient actually  appears to be doing quite well with regard to her right lower extremity I do not see any signs of active infection at this time. Fortunately there is no fever chills noted. She is still taking the antibiotics which I prescribed for her at this point. In regard to the left lower extremity I do feel like some of these areas are better although again she still is having weeping from several  locations at this time. 04/27/2019 on evaluation today patient appears to be doing about the same if not slightly worse in regard to her left lower extremity ulcers. She tells me when questioned that she has been sleeping in her Hoveround chair in fact she tells me she falls asleep without even knowing it. I think she is spending a whole lot of time in the chair and less time walking and moving around which is not good for her legs either. On top of that she is in a seated position which is also the worst position she is not really elevating her legs and she is also not using her lymphedema pumps. All this is good to contribute to worsening of her condition in general. 05/18/2019 on evaluation today patient appears to be doing well with regard to her lower extremity on the right in fact this is showing no signs of any open wounds at this time. On the left she is continuing to have issues with areas that do drain. Some of the regions have healed and there are couple areas that have reopened. She did go to the ER per the patient according to recommendations from the home health nurse due to what she was seen when she came out on 05/13/2019. Subsequently she felt like the patient needed to go to the hospital due to the fact that again she was having "milky white discharge" from her leg. Nonetheless she had and then was placed on doxycycline and subsequently seems to be doing better. 06/01/2019 upon evaluation today patient appears to be doing really in my opinion about the same. I do not see any signs of active infection  which is good news. Overall she still has wounds over the bilateral lower extremities she has reopened on the right but this appears to be more of a crack where there is weeping/edema coming from the region. I do not see any evidence of infection at either site based on what I visualized today. 07/13/2019 upon evaluation today patient appears to be doing a little worse compared to last time I saw her. She since has been in the hospital from 06/21/2019 through 06/29/2019. This was secondary to having Covid. During that time they did apply lotion to her legs which unfortunately has caused her to develop a myriad of open wounds on her lower extremities. Her legs do appear to be doing better as far as the overall appearance is concerned but nonetheless she does have more open and weeping areas. 07/27/2019 upon evaluation today patient appears to be doing more poorly to be honest in regard to her left lower extremity in particular. There is no signs of systemic infection although I do believe she may have local infection. She notes she has been having a lot of blue/green drainage which is consistent potentially with Pseudomonas. That may be something that we need to consider here as well. The doxycycline does not seem to have been helping. 08/03/2019 upon evaluation today patient appears to be doing a little better in my opinion compared to last week's evaluation. Her culture I did review today and she is on appropriate medications to help treat the Enterobacter that was noted. Overall I feel like that is good news. With that being said she is unfortunately continuing to have a lot of drainage and though it is doing better I still think she has a long ways to go to get things dried up in general. Fortunately there is no signs of systemic infection. 08/10/2019 upon  evaluation today patient appears to be doing may be slightly better in regard to her left lower extremity the right lower extremity is doing  much better. Fortunately there is no signs of infection right now which is good news. No fevers, chills, nausea, vomiting, or diarrhea. 08/24/2019 on evaluation today patient appears to be doing slightly better in regard to her lower extremities. The left lower extremity seems to be healed the right lower extremity is doing better though not completely healed as far as the openings are concerned. She has some generalized issues here with edema and weeping secondary to her lymphedema though again I do believe this is little bit drier compared to prior weeks evaluations. In general I am very pleased with how things seem to be progressing. No fevers, chills, nausea, vomiting, or diarrhea. 08/31/2019 upon evaluation today patient actually seems to making some progress here with regard to the left lower extremity in particular. She has been tolerating the dressing changes without complication. Fortunately there is no signs of active infection at this time. No fevers, chills, nausea, vomiting, or diarrhea. She did see Dr. Doren Custard and he did note that she did have a issue with the left great saphenous vein and the small saphenous vein in the leg. With that being said he was concerned about the possibility of laser ablation not being extremely successful. He also mentioned a small risk of DVT associated with the procedure. However if the wounds do not continue to improve he stated that that would probably be the way to go. Fortunately the patient's legs do seem to be doing much better. 09/07/2019 upon evaluation today patient appears to be doing better with regard to her lower extremities. She has been tolerating the dressing changes without complication. With that being said she is showing signs of improvement and overall very pleased. There are some areas on her leg that I think we do need to debride we discussed this last week the patient is in agreement with doing that as long as it does not hurt too  badly. 09/14/2019 upon evaluation today patient appears to be doing decently well with regard to her left lower extremity. She is not having near as much weeping as she has had in the past things seem to be drying up which is good news. There is no signs of active infection at this time. 09/21/19 upon evaluation today patient appears to be doing better in regard overall to her bilateral lower extremities. She again has less open than she did previous and each week I feel like this is getting better. Fortunately there is no signs of active infection at this time. No fevers, chills, nausea, vomiting, or diarrhea. 09/28/2019 upon evaluation today patient actually appears to be showing signs of improvement with regard to her left lower extremity. Unfortunately the right medial lower extremity around the ankle region has reopened to some degree but this appears to be minimal still which is good news. There is no signs of active infection at this time which is also good news. 10/12/2019 upon evaluation today patient appears to be doing okay with regard to her bilateral lower extremities today. The right is a little bit worse then last evaluation 2 weeks ago. The left is actually doing a little better in my opinion. Overall there is no signs of active infection at this time that I see. Obviously that something we have to keep a close eye on she is very prone to this with the significant and multiple  openings that she has over the bilateral lower extremities. 10/19/2019 upon evaluation today patient appears to be doing about the best that I have seen her in quite some time. She has been tolerating the dressing changes without complication. There does not appear to be any signs of active infection and overall I am extremely happy with the way her legs appeared. She is drying up quite nicely and overall is having less pain. 11/09/2019 upon evaluation today patient appears to be doing better in regard to her wounds.  She seems to be drying up more and more each time I see her this is just taking a very long time. Fortunately there is no signs of active infection at this time. 11/23/2019 upon evaluation today patient actually appears to be doing excellent in regard to her lower extremities at this point compared to where she has been. Fortunately there is no signs of active infection at this time. She did go to the hospital last week for nausea and vomiting completely unrelated to her wounds. Fortunately she is doing better she was given some Reglan and got better. She had associated abdominal pain but they never found out what was going on. 12/07/2019 upon evaluation today patient appears to be doing well for the most part in regard to her legs. She unfortunately has not been keeping the [place] portion of her wraps on therefore the compression has not really been sufficient for what it is supposed to be. Nonetheless she tells me that it just hurt too bad therefore she removed it. 12/21/2019 upon evaluation today patient actually appears to be doing quite well with regard to her legs. I do feel like she has been making progress which is great news and overall there is no signs of active infection at this time. No fevers, chills, nausea, vomiting, or diarrhea. 01/04/2020 upon evaluation today patient presents for follow-up concerning her lower extremity edema bilaterally. She still has open wounds she has not been using her lymphedema pumps. She is also not been utilizing her compression wraps appropriately she tends to unwrap them, take them off, or states that they hurt. Obviously the reason they hurt is because her legs start to swell but the issue is if she would use her compression/lymphedema pumps regularly she would not swell and she would have the pain. Nonetheless she has not even picked them up once honestly over the past several months and may be even as much as in the past year based on my opinion and what have  seen. She tells me today that after last week when I talked about this with her specifically actually that was 2 weeks ago that she "forgot". 8//21 on evaluation today patient appears to be doing a little better in regard to her legs bilaterally. Fortunately there is no signs of active infection at this time. She tells me that she used her lymphedema pumps all of one time over the past 2 weeks since I last saw her. She tells me that she has been too busy in order to continue to use these. 02/01/2020 on evaluation today patient appears to be doing some better in regard to her wounds in general in her legs. We felt the right was healed although is not completely it does appear to be doing better she tells me she has been using her lymphedema pumps that she has had this six times since I last saw her. Obviously the more she does that the better she would do my opinion 02/15/2020  upon evaluation today patient appears to be doing about the same in regard to her legs. She tells me that she is pumping I'm still not sure how much she does to be perfectly honest. However even if she does a little bit here and there I guess that is better than nothing. Fortunately there is no sign of active infection at this time which is great news. No fevers, chills, nausea, vomiting, or diarrhea. 02/29/2020 on evaluation today patient actually appears to be doing quite well all things considered this week. She has been tolerating the dressing changes without complication. Fortunately there is no signs of active infection at this time. No fevers, chills, nausea, vomiting, or diarrhea. 03/14/2020 upon evaluation today patient appears to be doing really about the same in regard to her legs. There is no signs of improvement overall and she as noted from home health does not appear to be elevating her legs he can get into her lift chair. There is too much stuff piled up on it the patient tells me. She also tells me she cannot really  use her pumps effectively due to the fact that she cannot have any space to get them on. Finally she is also not really elevating her legs because she is not sleeping in her bed she is sleeping in her chair currently and again overall I think everything that she is done in combination has been exactly the wrong thing for what she needs for her legs. 04/04/2020 upon evaluation today patient appears to be doing well at this time with regard to her legs. She is actually been pumping, keeping her wraps on, and to be honest she seems to be doing dramatically better the right leg is excellent the left leg is also excellent and measuring much smaller than previous. 04/18/2020 upon evaluation today patient actually is continue to make good progress in regard to her lower extremities bilaterally. Everything is improving and less wet that has been in the past overall I am extremely pleased with where things stand and I think that she is making great progress. The patient tells me she still continue to use her compression pumps 05/02/2020 on evaluation today patient appears to be doing well at this time in regard to her left leg which is showing signs of drying up. With that being said she does have a lot of lymphedema type crusty skin around the toes of her left foot and the right medial ankle which has opened at this point. Fortunately there is no signs of active infection systemically at this point or even locally for that matter. 05/23/2020 on evaluation today patient appears to be doing well with regard to her lower extremities. Fortunately there is no signs of active infection at this time. No fever chills noted. She has been very depressed however she tells me. 06/06/2020 patient came in today for evaluation in regard to her bilateral lower extremity ulcerations. With that being said she came in feeling okay and actually laughing and joking around with the staff checking her in. Subsequently however she had  a coughing spell and following the coughing spell it was a dramatic conversion from being jovial and joking around to being extremely short of breath her vital signs actually dropped in regard to her blood pressure from around 175 to down around 160 for systolic and from around 96 diastolic down to around 70. With that being said she also accompanied this with an increase in her respiratory rate which was also quite  significant. Nonetheless I actually upon going into see her was extremely worried we called EMS to have her transported to the ER for further evaluation and treatment. She continued until EMS got here to be extremely short of breath even on 6 L of oxygen. Her oxygen saturation did come up to 99% but overall it was only 95 before which was not terrible she did feel like the oxygen helped her feel somewhat better however. She has a history of asthma but again even the coughing spell really should not have done this degree of alteration in her demeanor from where she was just before to after the coughing spell. She tells [place] however she has been feeling somewhat abnormal since Friday although she could not really pinpoint exactly what was going on. 06/27/2020 plan evaluation today patient appears to be doing okay in regard to her leg ulcers. She is really not showing a lot of improvement to be honest and she still has a lot of weeping. With that being said after I last saw her we called EMS to take her to the hospital she actually did not go she went to see her primary care provider who according to the patient have not identified and read through the note states that everything checked out okay. Nonetheless the patient has continued to have bouts where she gets very short of breath for seemingly no reason whatsoever. That happened even the same night that I saw her last time. This was after she refused transport to the hospital. With that being said she also tells me that just getting dressed to  come here is quite a chore which is putting on her shirt causing her to have to stop to catch her breath. Obviously this is not normal and I feel like there is something going on. She may need a referral to be seen by cardiology ASAP. She does see her primary care provider early next week she tells me 07/11/2020 upon evaluation today patient's wounds again appear to be doing about the same she mainly has weeping of the bilateral lower extremities both are very swollen today she tells me she has been using her pumps but last time I saw her she told me she did not even have her pumps out that she had "packed them away because they had too much stuff in their house. Apparently her husband has been bringing stuff in from storage units. She then subsequently told me that she had so much stuff in her house that she has been staying up all night and did not go to bed till 7:00 this morning because she was cleaning up stuff. Nonetheless I am still unsure as to whether or not she is using the pumps based on what I am seeing I would think probably not. Nonetheless she has been keeping the compression wraps in place that is at least something 07/25/2020 upon evaluation today patient appears to be doing well currently in regard to her legs all things considered. I do not think she is doing any worse significantly although honestly I do not think she is doing a lot better either. There does not appear to be any signs of infection which is good news although she does have a lot of weeping. She is using her pumps she tells me sporadically though she says that her husband is not a big fan of helping her to get the [place]. She also tells me is difficult because in their house. It would be easiest for  her to use these as where her son is actually staying with her and living out at this point. Obviously I understand that she is having a lot of issues here and to be honest I do not really know what to say in that regard and  the fact that I really cannot change the situations but I do believe she really needs to be using the lymphedema pumps to try to keep things under control here. Outside of that I think she is going to continue to have significant issues. I think the pumps coupled with good compression and elevation are the only things that are to help her at this point. 08/08/2020 upon evaluation today patient appears to be doing a little worse in regard to her legs in general. I think this may be due to some infection currently. With that being said I am going to go ahead and likely see about putting her on an antibiotic. Were also can obtain a culture today where she had some purulent drainage. 08/29/2020 upon evaluation today patient appears to be doing about the same in regard to her bilateral lower extremities. Unfortunately she is continuing to have significant issues here with edema and she has significant lymphedema. With that being said she is really not doing anything that she is supposed to be doing as far as elevation, compression, using lymphedema pumps or anything really for that matter. She does not use her pumps, is taken the wraps off shortly after having them put on as far as home health is concerned at least by the next day she tells me, and overall is not really elevating her legs she is telling me she has a lot to do as far as cleaning up in her house. Nonetheless this triad of noncompliance is leading to worsening not improving in regard to her legs. I had a very strong conversation with her about this today again. 09/12/2020 on evaluation today patient appears to be doing poorly with regard to her lower extremities bilaterally. Fortunately there does not appear to be any signs of active infection at this point. That is systemically. Locally there is some signs of pus coming from an area on her left leg. She has been on antibiotics recently though she is done with those currently. Nonetheless she  tells me she has been having increased pain with the right leg this is extremely swollen as well. Fortunately there does not appear to be significant infection of the right leg though I think her pain is really as result of the increased swelling she has been declining the wrap on the right leg and the wounds here are getting significantly worse week by week. This obviously is not will be want to see. I discussed with the patient however if she does not use the wraps, use her pumps, and elevate her legs that she is not to get better. She has been sleeping in her motorized Hoveround which does not even allow her to elevate her legs at all. She tells me that she is also constantly "cleaning up as her husband told her that he wants to move back to [place]." Unfortunately this has been the narrative for the past several months in fact that she tells me that she has been having to clean up mass that was moved in from storage units into their home and that she does not really sleep and she never really elevates her legs. I explained to the patient today that if she is not  can I do any of this that there is really nothing that I can do to help her. 09/26/20 upon evaluation today patient appears to be doing about the same in regard to her wounds. Fortunately there is no signs of active infection at this time. No fever chills noted. She has been tolerating the dressing changes without complication which is good news. With that being said I do think that she unfortunately is still having a lot of pain but I think this is due to the swelling that really is not controlled and as I discussed with her last time she needs to be using her lymphedema pumps there is still in the box she has not even attempted to use those whatsoever. I am also not even certain she is taking her fluid pills as she states she may need to have "get a refill" she was seen with her husband at the appointment today. 10/24/2020 upon  evaluation today patient appears to be doing decently well in regard to her legs all things considered. She has been tolerating the dressing changes without complication. Fortunately there is no signs of active infection at this time. No fevers, chills, nausea, vomiting, or diarrhea. With that being said the patient does appear to have some excessive thickened skin buildup currently that has been require some sharp debridement to clear this away. I am hopeful if we do this we will be able to get some of this area to dry out this otherwise trapping fluid underneath. Her son is present during the office visit today. She tells me she still been doing a lot as far as cleaning up and going through boxes she tells me she has been up for a long time already this morning. 5/25; patient presents for 2-week follow-up. She had 3 layer compression wrap placed with calcium alginate underneath. She reports tolerating the wraps well. She has home health that changes the wrap 2-3 times a week. She denies signs of infection. She has almost finished her course of antibiotics. She uses her lymphedema pumps once weekly. 11/21/2020 upon evaluation today patient appears to be doing well with regard to her legs all things considered. Fortunately there is no signs of active infection at this time which is great news I do not see any evidence of infection and overall I think that she is definitely improved compared to where things did previous. Nonetheless I do think that she may benefit from removing some of the thicker skin on the legs. Again we have attempted this in the past to some degree but I think that we might be able to more effectively do this of a clear some way and then potentially use a urea cream with salicylic acid to try to help clear some of this away. The patient is in agreement with the plan. Objective Constitutional Well-nourished and well-hydrated in no acute distress. Vitals Time Taken: 2:33 PM, Height:  62 in, Weight: 335 lbs, BMI: 61.3, Temperature: 98.9 F, Pulse: 96 bpm, Respiratory Rate: 20 breaths/min, Blood Pressure: 116/68 mmHg, Capillary Blood Glucose: 120 mg/dl. General Notes: glucose per pt report Respiratory normal breathing without difficulty. Psychiatric this patient is able to make decisions and demonstrates good insight into disease process. Alert and Oriented x 3. pleasant and cooperative. General Notes: Upon inspection patient's wound bed actually showed signs of good granulation epithelization at this point. There does not appear to be any evidence of infection she still has a lot of very thick tissue noted over the skin in  the lower extremities again this is secondary to the chronic lymphedema changes. Nonetheless I think we need to try to soften some of this up carefully and slowly so as not to cause infection but at the same time get some of this cleared away to improve her overall quality of her legs if we have any chance of getting this healed. Integumentary (Hair, Skin) Wound #61 status is Open. Original cause of wound was Gradually Appeared. The date acquired was: 04/20/2019. The wound has been in treatment 83 weeks. The wound is located on the Left,Circumferential Lower Leg. The wound measures 9cm length x 26cm width x 0.1cm depth; 183.783cm^2 area and 18.378cm^3 volume. There is Fat Layer (Subcutaneous Tissue) exposed. There is no tunneling or undermining noted. There is a large amount of serous drainage noted. The wound margin is indistinct and nonvisible. There is large (67-100%) pink, pale granulation within the wound bed. There is no necrotic tissue within the wound bed. Wound #64 status is Open. Original cause of wound was Gradually Appeared. The date acquired was: 06/01/2019. The wound has been in treatment 77 weeks. The wound is located on the Left,Dorsal Foot. The wound measures 7cm length x 5.5cm width x 0.1cm depth; 30.238cm^2 area and 3.024cm^3 volume.  There is Fat Layer (Subcutaneous Tissue) exposed. There is no tunneling or undermining noted. There is a medium amount of serous drainage noted. The wound margin is indistinct and nonvisible. There is large (67-100%) pink, pale granulation within the wound bed. There is no necrotic tissue within the wound bed. Wound #67 status is Open. Original cause of wound was Gradually Appeared. The date acquired was: 05/02/2020. The wound has been in treatment 29 weeks. The wound is located on the Right,Medial Lower Leg. The wound measures 2.4cm length x 4cm width x 0.2cm depth; 7.54cm^2 area and 1.508cm^3 volume. There is Fat Layer (Subcutaneous Tissue) exposed. There is no tunneling or undermining noted. There is a medium amount of serosanguineous drainage noted. The wound margin is indistinct and nonvisible. There is large (67-100%) pink, pale granulation within the wound bed. There is a small (1-33%) amount of necrotic tissue within the wound bed including Adherent Slough. 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 There was a Selective/Open Wound Skin/Epidermis Debridement with a total area of 80 sq cm  performed by [name], PA. With the following instrument(s): Curette to remove Non-Viable tissue/material. Material removed includes Skin: Epidermis and Fibrin/Exudate and. No specimens were taken. A time out was conducted at 15:05, prior to the start of the procedure. There was no bleeding. The procedure was tolerated well with a pain level of 0 throughout and a pain level of 0 following the procedure. Post Debridement Measurements: 9cm length x 26cm width x 0.1cm depth; 18.378cm^3 volume. Character of Wound/Ulcer Post Debridement requires further debridement. Severity  of Tissue Post Debridement is: Fat layer exposed. Post procedure Diagnosis Wound #61: Same as Pre-Procedure Pre-procedure diagnosis of Wound #61 is a Venous Leg Ulcer located on the Left,Circumferential Lower Leg . There was a Three Layer Compression Therapy Procedure by Rhae Hammock, RN. Post procedure Diagnosis Wound #61: Same as Pre-Procedure Wound #67 Pre-procedure diagnosis of Wound #67 is a Diabetic Wound/Ulcer of the Lower Extremity located on the Right,Medial Lower Leg .Severity of Tissue Pre Debridement is: Fat layer exposed. There was a Selective/Open Wound Skin/Epidermis Debridement with a total area of 16 sq cm performed by [name], PA. With the following instrument(s): Curette to remove Non-Viable tissue/material. Material removed includes Skin: Epidermis and Fibrin/Exudate and. No specimens were taken. A time out was conducted at 15:05, prior to the start of the procedure. There was no bleeding. The procedure was tolerated well with a pain level of 0 throughout and a pain level of 0 following the procedure. Post Debridement Measurements: 2.4cm length x 4cm width x 0.1cm depth; 0.754cm^3 volume. Character of Wound/Ulcer Post Debridement requires further debridement. Severity of Tissue Post Debridement is: Fat layer exposed. Post procedure Diagnosis Wound #67: Same as Pre-Procedure Pre-procedure  diagnosis of Wound #67 is a Diabetic Wound/Ulcer of the Lower Extremity located on the Right,Medial Lower Leg . There was a Three Layer Compression Therapy Procedure by Rhae Hammock, RN. Post procedure Diagnosis Wound #67: Same as Pre-Procedure Plan Follow-up Appointments: Return Appointment in 2 weeks. Bathing/ Shower/ Hygiene: May shower and wash wound with soap and water. - with dressing changes, wash both legs with wash cloth and soap and water with dressing changes Other Bathing/Shower/Hygiene Orders/Instructions: - Home health to wash legs with warn soapy water and a clean wash cloth with dressing changes Edema Control - Lymphedema / SCD / Other: Lymphedema Pumps. Use Lymphedema pumps on leg(s) 2-3 times a day for 45-60 minutes. If wearing any wraps or hose, do not remove them. Continue exercising as instructed. Elevate legs to the level of the heart or above for 30 minutes daily and/or when sitting, a frequency of: - especially at night Avoid standing for long periods of time. Exercise regularly Home Health: No change in wound care orders this week; continue Home Health for wound care. May utilize formulary equivalent dressing for wound treatment orders unless otherwise specified. - add urea cream to legs with dressing changes when available Dressing changes to be completed by [facility] on Monday / Wednesday / Friday except when patient has scheduled visit at [facility]. Other Home Health Orders/Instructions: - [facility] WOUND #61: - Lower Leg Wound Laterality: Left, Circumferential Peri-Wound Care: Sween Lotion (Moisturizing lotion) (Home Health) 3 x Per Week/30 Days Discharge Instructions: Apply moisturizing lotion to dry skin on legs Peri-Wound Care: Urea Cream 40%, tube, 1 (oz) 3 x Per Week/30 Days Discharge Instructions: apply to lower legs with dressing changes Prim Dressing: Maxorb Extra Calcium Alginate 2x2 in (Home Health) 3 x Per Week/30 Days ary Discharge  Instructions: Apply calcium alginate to wound bed as instructed Secondary Dressing: ABD Pad, 8x10 (Home Health) 3 x Per Week/30 Days Discharge Instructions: Apply over primary dressing as directed. Secondary Dressing: Zetuvit Plus 4x4 in (Home Health) 3 x Per Week/30 Days Discharge Instructions: or equivalent extra absorbent pad.Apply over primary dressing as directed. Com pression Wrap: ThreePress (3 layer compression wrap) (Home Health) 3 x Per Week/30 Days Discharge Instructions: Apply three layer compression as directed. Pad bend of ankle with foam or ABD pad. WOUND #64: -  Foot Wound Laterality: Dorsal, Left Peri-Wound Care: Sween Lotion (Moisturizing lotion) (Home Health) 3 x Per Week/30 Days Discharge Instructions: Apply moisturizing lotion to dry skin on legs Peri-Wound Care: Urea Cream 40%, tube, 1 (oz) 3 x Per Week/30 Days Discharge Instructions: apply to lower legs with dressing changes Prim Dressing: Maxorb Extra Calcium Alginate 2x2 in (Home Health) 3 x Per Week/30 Days ary Discharge Instructions: Apply calcium alginate to wound bed as instructed Secondary Dressing: ABD Pad, 8x10 (Home Health) 3 x Per Week/30 Days Discharge Instructions: Apply over primary dressing as directed. Secondary Dressing: Zetuvit Plus 4x4 in (Home Health) 3 x Per Week/30 Days Discharge Instructions: or equivalent extra absorbent pad.Apply over primary dressing as directed. Com pression Wrap: ThreePress (3 layer compression wrap) (Home Health) 3 x Per Week/30 Days Discharge Instructions: Apply three layer compression as directed. Pad bend of ankle with foam or ABD pad. WOUND #67: - Lower Leg Wound Laterality: Right, Medial Peri-Wound Care: Sween Lotion (Moisturizing lotion) (Home Health) 3 x Per Week/30 Days Discharge Instructions: Apply moisturizing lotion to dry skin on legs Peri-Wound Care: Urea Cream 40%, tube, 1 (oz) 3 x Per Week/30 Days Discharge Instructions: apply to lower legs with dressing  changes Prim Dressing: Maxorb Extra Calcium Alginate 2x2 in (Home Health) 3 x Per Week/30 Days ary Discharge Instructions: Apply calcium alginate to wound bed as instructed Secondary Dressing: ABD Pad, 8x10 (Home Health) 3 x Per Week/30 Days Discharge Instructions: Apply over primary dressing as directed. Secondary Dressing: Zetuvit Plus 4x4 in (Home Health) 3 x Per Week/30 Days Discharge Instructions: or equivalent extra absorbent pad.Apply over primary dressing as directed. Com pression Wrap: ThreePress (3 layer compression wrap) (Home Health) 3 x Per Week/30 Days Discharge Instructions: Apply three layer compression as directed. Pad bend of ankle with foam or ABD pad. 1. Would recommend currently that we going continue with the wound care measures as before and the patient is in agreement with plan. This includes the use of the compression wrapping. I do believe this has been beneficial for her. The patient is in agreement with continuing with this. We are wrapping both legs. This is a 3 layer compression wrap. 2. I am also can recommend that we continue with the silver alginate dressing followed by Lauraine Rinne or this similar dressings as well as ABD pads. 3. We will get a use a 40% urea cream with 2% salicylic acid. I think this could be helpful to clear away some of the thickened tissue and skin on her lower extremities bilaterally. We will see patient back for reevaluation in 2 weeks here in the clinic. If anything worsens or changes patient will contact our office for additional recommendations. Electronic Signature(s) Signed: 11/21/2020 3:27:22 PM By: [name] PA-C Entered By: [name] on 11/21/2020 15:27:21 -------------------------------------------------------------------------------- SuperBill Details Patient Name: Date of Service: [name] 11/21/2020 Medical Record Number: [id number] Patient Account Number: [id number] Date of Birth/Sex: Treating RN: 1951/02/25  (70 y.o. [name] Primary Care Provider: [name] Other Clinician: Referring Provider: Treating Provider/Extender: Darlen Round in Treatment: 172 Diagnosis Coding ICD-10 Codes Code Description E11.622 Type 2 diabetes mellitus with other skin ulcer I89.0 Lymphedema, not elsewhere classified I87.331 Chronic venous hypertension (idiopathic) with ulcer and inflammation of right lower extremity I87.332 Chronic venous hypertension (idiopathic) with ulcer and inflammation of left lower extremity L97.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 leg with fat layer exposed L97.822 Non-pressure chronic ulcer of other part of  left lower leg with fat layer exposed L97.522 Non-pressure chronic ulcer of other part of left foot with fat layer exposed I10 Essential (primary) hypertension E66.01 Morbid (severe) obesity due to excess calories F41.8 Other specified anxiety disorders R53.1 Weakness Facility Procedures The patient participates with Medicare or their insurance follows the Medicare Facility Guidelines: CPT4 Code Description Modifier Quantity [id number] - DEBRIDE WOUND 1ST 20 SQ CM OR < 1 ICD-10 Diagnosis Description G87.195 Non-pressure chronic ulcer  of other part of right lower leg with fat layer exposed L97.822 Non-pressure chronic ulcer of other part of left lower leg with fat layer exposed The patient participates with Medicare or their insurance follows the Medicare Facility Guidelines: [id number] - DEBRIDE WOUND EA ADDL 20 SQ CM 4 ICD-10 Diagnosis Description L97.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 leg with  fat layer exposed L97.822 Non-pressure chronic ulcer of other part of left lower leg with fat layer exposed Physician Procedures : CPT4 Code Description Modifier [id number] - WC PHYS DEBR WO ANESTH 20 SQ CM ICD-10 Diagnosis Description L97.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 leg  with fat layer exposed L97.822 Non-pressure chronic ulcer of other part of  left lower leg with fat layer exposed Quantity: 1 : [id number] - WC PHYS DEBR WO ANESTH EA ADD 20 CM ICD-10 Diagnosis Description X67.289 Non-pressure chronic ulcer of other part of right lower leg with fat layer exposed L97.822 Non-pressure chronic ulcer of other part of left lower leg with fat  layer exposed Quantity: 4 Electronic Signature(s) Signed: 11/21/2020 3:27:34 PM By: [name] PA-C Entered By: [name] on 11/21/2020 15:27:32

## 2020-11-21 NOTE — Progress Notes (Signed)
[name], [name] ([id number]) Visit Report for 11/21/2020 Arrival Information Details Patient Name: Date of Service: [name], [name] 11/21/2020 1:00 PM Medical Record Number: [id number] Patient Account Number: [id number] Date of Birth/Sex: Treating RN: 15-Aug-1950 (70 y.o. [name] Primary Care : [name] Other Clinician: Referring : Treating /Extender: Darlen Round in Treatment: 26 Visit Information History Since Last Visit Added or deleted any medications: No Patient Arrived: Wheel Chair Any new allergies or adverse reactions: No Arrival Time: 14:33 Had a fall or experienced change in No Accompanied By: alone activities of daily living that may affect Transfer Assistance: None risk of falls: Patient Identification Verified: Yes Signs or symptoms of abuse/neglect since last visito No Secondary Verification Process Completed: Yes Hospitalized since last visit: No Patient Requires Transmission-Based Precautions: No Implantable device outside of the clinic excluding No Patient Has Alerts: Yes cellular tissue based products placed in the center Patient Alerts: R ABI= 1.01 since last visit: L ABI = .99 Has Dressing in Place as Prescribed: No Has Compression in Place as Prescribed: No Pain Present Now: Yes Electronic Signature(s) Signed: 11/21/2020 5:57:05 PM By: Levan Hurst RN, BSN Entered By: Levan Hurst on 11/21/2020 14:34:21 -------------------------------------------------------------------------------- Compression Therapy Details Patient Name: Date of Service: [name]. 11/21/2020 1:00 PM Medical Record Number: [id number] Patient Account Number: [id number] Date of Birth/Sex: Treating RN: 02-28-1951 (70 y.o. [name] Primary Care : [name] Other Clinician: Referring : Treating /Extender: Darlen Round in Treatment: 172 Compression Therapy Performed  for Wound Assessment: Wound #61 Left,Circumferential Lower Leg Performed By: Clinician [name] Hammock, RN Compression Type: Three Layer Post Procedure Diagnosis Same as Pre-procedure Electronic Signature(s) Signed: 11/21/2020 6:12:56 PM By: [name] RN, BSN Entered By: [name] on 11/21/2020 15:13:01 -------------------------------------------------------------------------------- Compression Therapy Details Patient Name: Date of Service: [name]. 11/21/2020 1:00 PM Medical Record Number: [id number] Patient Account Number: [id number] Date of Birth/Sex: Treating RN: 03-04-1951 (70 y.o. [name] Primary Care : [name] Other Clinician: Referring : Treating /Extender: Darlen Round in Treatment: 172 Compression Therapy Performed for Wound Assessment: Wound #67 Right,Medial Lower Leg Performed By: Clinician [name] Hammock, RN Compression Type: Three Layer Post Procedure Diagnosis Same as Pre-procedure Electronic Signature(s) Signed: 11/21/2020 6:12:56 PM By: [name] RN, BSN Entered By: [name] on 11/21/2020 15:13:01 -------------------------------------------------------------------------------- Encounter Discharge Information Details Patient Name: Date of Service: [name]. 11/21/2020 1:00 PM Medical Record Number: [id number] Patient Account Number: [id number] Date of Birth/Sex: Treating RN: 06/08/1951 (70 y.o. [name] Primary Care : [name] Other Clinician: Referring : Treating /Extender: Darlen Round in Treatment: [phone] Encounter Discharge Information Items Post Procedure Vitals Discharge Condition: Stable Temperature (F): 98.9 Ambulatory Status: Wheelchair Pulse (bpm): 96 Discharge Destination: Home Respiratory Rate (breaths/min): 20 Transportation: Private Auto Blood Pressure (mmHg): 116/68 Accompanied By:  self Schedule Follow-up Appointment: Yes Clinical Summary of Care: Electronic Signature(s) Signed: 11/21/2020 6:05:11 PM By: Deon Pilling Entered By: Deon Pilling on 11/21/2020 16:56:55 -------------------------------------------------------------------------------- Lower Extremity Assessment Details Patient Name: Date of Service: [name] 11/21/2020 1:00 PM Medical Record Number: [id number] Patient Account Number: [id number] Date of Birth/Sex: Treating RN: 1950/10/12 (70 y.o. [name] Primary Care : [name] Other Clinician: Referring : Treating /Extender: Darlen Round in Treatment: 172 Edema Assessment Assessed: [Left: No] [Right: No] Edema: [Left: Yes] [Right: Yes] Calf Left: Right: Point of Measurement: 37 cm From Medial Instep  46 cm 41 cm Ankle Left: Right: Point of Measurement: 10 cm From Medial Instep 31 cm 26 cm Vascular Assessment Pulses: Dorsalis Pedis Palpable: [Left:Yes] [Right:Yes] Electronic Signature(s) Signed: 11/21/2020 5:57:05 PM By: Levan Hurst RN, BSN Entered By: Levan Hurst on 11/21/2020 14:50:21 -------------------------------------------------------------------------------- [place] Details Patient Name: Date of Service: [name]. 11/21/2020 1:00 PM Medical Record Number: [id number] Patient Account Number: [id number] Date of Birth/Sex: Treating RN: 11-Nov-1950 (70 y.o. [name] Primary Care Irfan Veal: [name] Other Clinician: Referring Roselyn Doby: Treating Zekiah Coen/Extender: Darlen Round in Treatment: [place] reviewed with physician Active Inactive Venous Leg Ulcer Nursing Diagnoses: Actual venous Insuffiency (use after diagnosis is confirmed) Knowledge deficit related to disease process and management Goals: Patient will maintain optimal edema control Date Initiated: 08/12/2017 Target  Resolution Date: 12/19/2020 Goal Status: Active Patient/caregiver will verbalize understanding of disease process and disease management Date Initiated: 08/12/2017 Date Inactivated: 04/21/2018 Target Resolution Date: 04/24/2018 Goal Status: Met Interventions: Assess peripheral edema status every visit. Compression as ordered Treatment Activities: Therapeutic compression applied : 08/12/2017 Notes: Wound/Skin Impairment Nursing Diagnoses: Impaired tissue integrity Knowledge deficit related to ulceration/compromised skin integrity Goals: Patient/caregiver will verbalize understanding of skin care regimen Date Initiated: 08/12/2017 Target Resolution Date: 12/19/2020 Goal Status: Active Ulcer/skin breakdown will have a volume reduction of 30% by week 4 Date Initiated: 08/05/2017 Date Inactivated: 09/30/2017 Target Resolution Date: 10/03/2017 Goal Status: Met Ulcer/skin breakdown will have a volume reduction of 50% by week 8 Date Initiated: 09/30/2017 Date Inactivated: 10/28/2017 Target Resolution Date: 10/28/2017 Goal Status: Met Interventions: Assess patient/caregiver ability to perform ulcer/skin care regimen upon admission and as needed Assess ulceration(s) every visit Provide education on ulcer and skin care Screen for HBO Treatment Activities: Patient referred to home care : 08/05/2017 Skin care regimen initiated : 08/05/2017 Topical wound management initiated : 08/05/2017 Notes: Electronic Signature(s) Signed: 11/21/2020 6:12:56 PM By: [name] RN, BSN Entered By: [name] on 11/21/2020 15:10:01 -------------------------------------------------------------------------------- Pain Assessment Details Patient Name: Date of Service: [name]. 11/21/2020 1:00 PM Medical Record Number: [id number] Patient Account Number: [id number] Date of Birth/Sex: Treating RN: July 10, 1950 (70 y.o. [name] Primary Care Anjannette Gauger: [name] Other Clinician: Referring  Tymia Streb: Treating Zaeden Lastinger/Extender: Darlen Round in Treatment: 172 Active Problems Location of Pain Severity and Description of Pain Patient Has Paino Yes Site Locations Pain Location: Pain in Ulcers With Dressing Change: Yes Rate the pain. Current Pain Level: 4 Character of Pain Describe the Pain: Burning Pain Management and Medication Current Pain Management: Medication: Yes Cold Application: No Rest: No Massage: No Activity: No T.E.N.S.: No Heat Application: No Leg drop or elevation: No Is the Current Pain Management Adequate: Adequate How does your wound impact your activities of daily livingo Sleep: No Bathing: No Appetite: No Relationship With Others: No Bladder Continence: No Emotions: No Bowel Continence: No Work: No Toileting: No Drive: No Dressing: No Hobbies: No Electronic Signature(s) Signed: 11/21/2020 5:57:05 PM By: Levan Hurst RN, BSN Entered By: Levan Hurst on 11/21/2020 14:36:17 -------------------------------------------------------------------------------- Patient/Caregiver Education Details Patient Name: Date of Service: [name] 6/8/2022andnbsp1:00 PM Medical Record Number: [id number] Patient Account Number: [id number] Date of Birth/Gender: Treating RN: 02-02-1951 (70 y.o. [name] Primary Care Physician: [name] Other Clinician: Referring Physician: Treating Physician/Extender: Darlen Round in Treatment: 31 Education Assessment Education Provided To: Patient Education Topics Provided Venous: Methods: Explain/Verbal Responses: Reinforcements needed, State content correctly Wound/Skin Impairment: Methods: Explain/Verbal Responses:  Reinforcements needed, State content correctly Electronic Signature(s) Signed: 11/21/2020 6:12:56 PM By: [name] RN, BSN Entered By: [name] on 11/21/2020  15:11:44 -------------------------------------------------------------------------------- Wound Assessment Details Patient Name: Date of Service: [name]. 11/21/2020 1:00 PM Medical Record Number: [id number] Patient Account Number: [id number] Date of Birth/Sex: Treating RN: Jul 14, 1950 (70 y.o. [name] Primary Care : [name] Other Clinician: Referring : Treating /Extender: Darlen Round in Treatment: 172 Wound Status Wound Number: 61 Primary Venous Leg Ulcer Etiology: Wound Location: Left, Circumferential Lower Leg Wound Open Wounding Event: Gradually Appeared Wounding Event: Gradually Appeared Status: Date Acquired: 04/20/2019 Comorbid Asthma, Hypertension, Peripheral Arterial Disease, Peripheral Weeks Of Treatment: 83 History: Venous Disease, Type II Diabetes, Gout, Osteoarthritis Clustered Wound: Yes Photos Wound Measurements Length: (cm) 9 Width: (cm) 26 Depth: (cm) 0.1 Area: (cm) 183.783 Volume: (cm) 18.378 % Reduction in Area: -2337.4% % Reduction in Volume: -2337.4% Epithelialization: None Tunneling: No Undermining: No Wound Description Classification: Full Thickness Without Exposed Support Structures Wound Margin: Indistinct, nonvisible Exudate Amount: Large Exudate Type: Serous Exudate Color: amber Foul Odor After Cleansing: No Slough/Fibrino No Wound Bed Granulation Amount: Large (67-100%) Exposed Structure Granulation Quality: Pink, Pale Fascia Exposed: No Necrotic Amount: None Present (0%) Fat Layer (Subcutaneous Tissue) Exposed: Yes Tendon Exposed: No Muscle Exposed: No Joint Exposed: No Bone Exposed: No Treatment Notes Wound #61 (Lower Leg) Wound Laterality: Left, Circumferential Cleanser Peri-Wound Care Sween Lotion (Moisturizing lotion) Discharge Instruction: Apply moisturizing lotion to dry skin on legs Urea Cream 40%, tube, 1 (oz) Discharge Instruction: apply to lower  legs with dressing changes Topical Primary Dressing Maxorb Extra Calcium Alginate 2x2 in Discharge Instruction: Apply calcium alginate to wound bed as instructed Secondary Dressing ABD Pad, 8x10 Discharge Instruction: Apply over primary dressing as directed. Zetuvit Plus 4x4 in Discharge Instruction: or equivalent extra absorbent pad.Apply over primary dressing as directed. Secured With Compression Wrap ThreePress (3 layer compression wrap) Discharge Instruction: Apply three layer compression as directed. Pad bend of ankle with foam or ABD pad. Compression Stockings Add-Ons Electronic Signature(s) Signed: 11/21/2020 5:19:11 PM By: Sandre Kitty Signed: 11/21/2020 5:57:05 PM By: Levan Hurst RN, BSN Entered By: Sandre Kitty on 11/21/2020 16:52:58 -------------------------------------------------------------------------------- Wound Assessment Details Patient Name: Date of Service: [name]. 11/21/2020 1:00 PM Medical Record Number: [id number] Patient Account Number: [id number] Date of Birth/Sex: Treating RN: [birth year] (70 y.o. [name] Primary Care : [name] Other Clinician: Referring : Treating /Extender: Darlen Round in Treatment: 172 Wound Status Wound Number: 64 Primary Diabetic Wound/Ulcer of the Lower Extremity Etiology: Wound Location: Left, Dorsal Foot Wound Open Wounding Event: Gradually Appeared Status: Date Acquired: 06/01/2019 Comorbid Asthma, Hypertension, Peripheral Arterial Disease, Peripheral Weeks Of Treatment: 77 History: Venous Disease, Type II Diabetes, Gout, Osteoarthritis Clustered Wound: No Photos Wound Measurements Length: (cm) 7 Width: (cm) 5.5 Depth: (cm) 0.1 Area: (cm) 30.238 Volume: (cm) 3.024 % Reduction in Area: -18226.1% % Reduction in Volume: -6071.4% Epithelialization: Small (1-33%) Tunneling: No Undermining: No Wound Description Classification: Grade  1 Wound Margin: Indistinct, nonvisible Exudate Amount: Medium Exudate Type: Serous Exudate Color: amber Foul Odor After Cleansing: No Slough/Fibrino No Wound Bed Granulation Amount: Large (67-100%) Exposed Structure Granulation Quality: Pink, Pale Fascia Exposed: No Necrotic Amount: None Present (0%) Fat Layer (Subcutaneous Tissue) Exposed: Yes Tendon Exposed: No Muscle Exposed: No Joint Exposed: No Bone Exposed: No Treatment Notes Wound #64 (Foot) Wound Laterality: Dorsal, Left Cleanser Peri-Wound Care Sween Lotion (Moisturizing lotion) Discharge Instruction: Apply moisturizing lotion  to dry skin on legs Urea Cream 40%, tube, 1 (oz) Discharge Instruction: apply to lower legs with dressing changes Topical Primary Dressing Maxorb Extra Calcium Alginate 2x2 in Discharge Instruction: Apply calcium alginate to wound bed as instructed Secondary Dressing ABD Pad, 8x10 Discharge Instruction: Apply over primary dressing as directed. Zetuvit Plus 4x4 in Discharge Instruction: or equivalent extra absorbent pad.Apply over primary dressing as directed. Secured With Compression Wrap ThreePress (3 layer compression wrap) Discharge Instruction: Apply three layer compression as directed. Pad bend of ankle with foam or ABD pad. Compression Stockings Add-Ons Electronic Signature(s) Signed: 11/21/2020 5:19:11 PM By: Sandre Kitty Signed: 11/21/2020 5:57:05 PM By: Levan Hurst RN, BSN Entered By: Sandre Kitty on 11/21/2020 16:52:25 -------------------------------------------------------------------------------- Wound Assessment Details Patient Name: Date of Service: [name]. 11/21/2020 1:00 PM Medical Record Number: [id number] Patient Account Number: [id number] Date of Birth/Sex: Treating RN: Apr 30, 1951 (70 y.o. [name] Primary Care Abhinav Mayorquin: [name] Other Clinician: Referring Julaine Zimny: Treating Yasamin Karel/Extender: Darlen Round in  Treatment: 172 Wound Status Wound Number: 67 Primary Diabetic Wound/Ulcer of the Lower Extremity Etiology: Wound Location: Right, Medial Lower Leg Wound Open Wounding Event: Gradually Appeared Status: Date Acquired: 05/02/2020 Comorbid Asthma, Hypertension, Peripheral Arterial Disease, Peripheral Weeks Of Treatment: 29 History: Venous Disease, Type II Diabetes, Gout, Osteoarthritis Clustered Wound: No Photos Wound Measurements Length: (cm) 2.4 Width: (cm) 4 Depth: (cm) 0.2 Area: (cm) 7.54 Volume: (cm) 1.508 % Reduction in Area: -433.2% % Reduction in Volume: -969.5% Epithelialization: Small (1-33%) Tunneling: No Undermining: No Wound Description Classification: Grade 1 Wound Margin: Indistinct, nonvisible Exudate Amount: Medium Exudate Type: Serosanguineous Exudate Color: red, brown Foul Odor After Cleansing: No Slough/Fibrino No Wound Bed Granulation Amount: Large (67-100%) Exposed Structure Granulation Quality: Pink, Pale Fascia Exposed: No Necrotic Amount: Small (1-33%) Fat Layer (Subcutaneous Tissue) Exposed: Yes Necrotic Quality: Adherent Slough Tendon Exposed: No Muscle Exposed: No Joint Exposed: No Bone Exposed: No Treatment Notes Wound #67 (Lower Leg) Wound Laterality: Right, Medial Cleanser Peri-Wound Care Sween Lotion (Moisturizing lotion) Discharge Instruction: Apply moisturizing lotion to dry skin on legs Urea Cream 40%, tube, 1 (oz) Discharge Instruction: apply to lower legs with dressing changes Topical Primary Dressing Maxorb Extra Calcium Alginate 2x2 in Discharge Instruction: Apply calcium alginate to wound bed as instructed Secondary Dressing ABD Pad, 8x10 Discharge Instruction: Apply over primary dressing as directed. Zetuvit Plus 4x4 in Discharge Instruction: or equivalent extra absorbent pad.Apply over primary dressing as directed. Secured With Compression Wrap ThreePress (3 layer compression wrap) Discharge Instruction: Apply  three layer compression as directed. Pad bend of ankle with foam or ABD pad. Compression Stockings Add-Ons Electronic Signature(s) Signed: 11/21/2020 5:19:11 PM By: Sandre Kitty Signed: 11/21/2020 5:57:05 PM By: Levan Hurst RN, BSN Entered By: Sandre Kitty on 11/21/2020 16:51:51 -------------------------------------------------------------------------------- Vitals Details Patient Name: Date of Service: [name]. 11/21/2020 1:00 PM Medical Record Number: [id number] Patient Account Number: [id number] Date of Birth/Sex: Treating RN: 1951-06-02 (70 y.o. [name] Primary Care Jejuan Scala: [name] Other Clinician: Referring Vannessa Godown: Treating Jomar Denz/Extender: Darlen Round in Treatment: 172 Vital Signs Time Taken: 14:33 Temperature (F): 98.9 Height (in): 62 Pulse (bpm): 96 Weight (lbs): 335 Respiratory Rate (breaths/min): 20 Body Mass Index (BMI): 61.3 Blood Pressure (mmHg): 116/68 Capillary Blood Glucose (mg/dl): 120 Reference Range: 80 - 120 mg / dl Notes glucose per pt report Electronic Signature(s) Signed: 11/21/2020 5:57:05 PM By: Levan Hurst RN, BSN Entered By: Levan Hurst on 11/21/2020 14:37:47

## 2020-12-03 ENCOUNTER — Other Ambulatory Visit: Payer: Self-pay

## 2020-12-03 ENCOUNTER — Ambulatory Visit: Payer: Medicare PPO | Admitting: Endocrinology

## 2020-12-03 VITALS — BP 158/78 | HR 95 | Ht 65.0 in | Wt 320.6 lb

## 2020-12-03 DIAGNOSIS — IMO0002 Reserved for concepts with insufficient information to code with codable children: Secondary | ICD-10-CM

## 2020-12-03 DIAGNOSIS — E1165 Type 2 diabetes mellitus with hyperglycemia: Secondary | ICD-10-CM

## 2020-12-03 DIAGNOSIS — E1151 Type 2 diabetes mellitus with diabetic peripheral angiopathy without gangrene: Secondary | ICD-10-CM

## 2020-12-03 LAB — POCT GLYCOSYLATED HEMOGLOBIN (HGB A1C): Hemoglobin A1C: 7 % — AB (ref 4.0–5.6)

## 2020-12-03 MED ORDER — INSULIN GLARGINE 100 UNIT/ML SOLOSTAR PEN: 50.0000 [IU] | PEN_INJECTOR | SUBCUTANEOUS | 3 refills | Status: DC

## 2020-12-03 NOTE — Patient Instructions (Addendum)
good diet and exercise significantly improve the control of your diabetes.  please let me know if you wish to be referred to a dietician.  high blood sugar is very risky to your health.  you should see an eye doctor and dentist every year.  It is very important to get all recommended vaccinations.  Controlling your blood pressure and cholesterol drastically reduces the damage diabetes does to your body.  Those who smoke should quit.  Please discuss these with your doctor.  check your blood sugar twice a day.  vary the time of day when you check, between before the 3 meals, and at bedtime.  also check if you have symptoms of your blood sugar being too high or too low.  please keep a record of the readings and bring it to your next appointment here (or you can bring the meter itself).  You can write it on any piece of paper.  please call us sooner if your blood sugar goes below 70, or if most of your readings are over 200.  I have sent a prescription to your pharmacy, to change the Novolog and glipizide to Lantus, 50 units each morning.  On this type of insulin schedule, you should eat meals on a regular schedule.  If a meal is missed or significantly delayed, your blood sugar could go low. Please call or message [place] next week, to tell us how the blood sugar is doing. Please come back for a follow-up appointment in 2 months.

## 2020-12-03 NOTE — Progress Notes (Signed)
Subjective:    Patient ID: [name], female    DOB: January 07, 1951, 70 y.o.   MRN: [id number]  HPI pt is referred by [name], PA, for diabetes.  Pt states DM was dx'ed in 2002; she is unaware of any chronic complicated by PN, foot ulcers, and PAD; she has been on insulin since 2012; pt says her diet is not good; she has never had GDM, pancreatitis, pancreatic surgery, severe hypoglycemia or DKA.  Here with husband today.  She takes Novolog 17 units BID, and glipizide 10-BID  She no longer takes [place].  She cannot afford brand name meds.  Pt does not recall what cbg's have been recently.   Past Medical History:  Diagnosis Date   Anxiety    Arthritis    "back, arms, legs" (03/24/2016)   Asthma    Chronic lower back pain    Colonic polyp    last colonoscopy done in 2009 with normal results per medical record   Depressive disorder    Gastric polyp    Gout    has taken allopurinol $RemoveBeforeDEI'300mg'[id number]$  once daily in past   Headache    History of hiatal hernia    Hypertension    Migraine    "none in awhile; might have a couple/year" (03/24/2016)   Mixed hyperlipidemia    01/2016 Total chol 141, HDL 59, LDL 63, ration 1.1   Osteoarthritis    TIA (transient ischemic attack) 11/2014   Type II diabetes mellitus ([place])     Past Surgical History:  Procedure Laterality Date   ARTERY BIOPSY Right 08/12/2016   Procedure: BIOPSY TEMPORAL ARTERY;  Surgeon: [name], MD;  Location: [facility] OR;  Service: Vascular;  Laterality: Right;  BIOPSY TEMPORAL ARTERY   BREAST BIOPSY Left ~ 2015   benign   CARPAL TUNNEL RELEASE Bilateral    DILATION AND CURETTAGE OF UTERUS     KNEE ARTHROSCOPY Right 2003   in [place]    Social History   Socioeconomic History   Marital status: Married    Spouse name: Not on file   Number of children: Not on file   Years of education: 12+   Highest education level: Not on file   Occupational History   Not on file  Tobacco Use   Smoking status: Never   Smokeless tobacco: Never  Vaping Use   Vaping Use: Never used  Substance and Sexual Activity   Alcohol use: No   Drug use: No   Sexual activity: Never  Other Topics Concern   Not on file  Social History Narrative   Right handed   Caffeine use: none   Lives with husband   Recently moved from [place] Strain: Not on file  Food Insecurity: Not on file  Transportation Needs: Not on file  Physical Activity: Not on file  Stress: Not on file  Social Connections: Not on file  Intimate Partner Violence: Not on file    Current Outpatient Medications on File Prior to Visit  Medication Sig Dispense Refill   ACCU-CHEK FASTCLIX LANCETS MISC 1 each as needed.     ACCU-CHEK SMARTVIEW test strip 1 each 4 (four) times daily. for testing  0   acetaminophen (TYLENOL) 500 MG tablet Take 500 mg by mouth daily as needed for moderate pain.     albuterol (  PROVENTIL HFA;VENTOLIN HFA) 108 (90 Base) MCG/ACT inhaler Inhale 2 puffs into the lungs every 4 (four) hours as needed for wheezing or shortness of breath.      allopurinol (ZYLOPRIM) 300 MG tablet Take 300 mg by mouth daily.     aspirin 325 MG tablet Take 325 mg by mouth daily.     atenolol (TENORMIN) 25 MG tablet Take 25-50 mg by mouth 2 (two) times daily. Take 50 mg every morning and then 25 mg every evening     Blood Glucose Monitoring Suppl (ACCU-CHEK NANO SMARTVIEW) w/Device KIT 1 each See admin instructions.  0   cyclobenzaprine (FLEXERIL) 5 MG tablet Take 5 mg by mouth 3 (three) times daily as needed.     diclofenac sodium (VOLTAREN) 1 % GEL Apply 4 g topically 4 (four) times daily. 1 Tube 1   Diclofenac Sodium CR (VOLTAREN-XR) 100 MG 24 hr tablet Take 1 tablet (100 mg total) by mouth daily. 10 tablet 0   furosemide (LASIX) 20 MG tablet Take 1 tablet (20 mg total) by mouth daily. 30 tablet 0   gabapentin  (NEURONTIN) 300 MG capsule Take 1 capsule (300 mg total) by mouth 2 (two) times daily. (Patient taking differently: Take 600 mg by mouth 2 (two) times daily.) 60 capsule 2   hydrocerin (EUCERIN) CREA Apply 1 application topically daily. 228 g 0   NIFEdipine (PROCARDIA XL/ADALAT-CC) 90 MG 24 hr tablet Take 90 mg by mouth daily.     NOVOFINE 32G X 6 MM MISC 1 each as needed.     polyethylene glycol (MIRALAX / GLYCOLAX) packet Take 17 g by mouth daily as needed for moderate constipation.     potassium chloride (K-DUR) 10 MEQ tablet Take 1 tablet (10 mEq total) by mouth daily. 30 tablet 0   protein supplement shake (PREMIER PROTEIN) LIQD Take 325 mLs (11 oz total) by mouth daily.  0   Cholecalciferol (VITAMIN D3) 2000 units capsule Take 2,000 Units by mouth daily. (Patient not taking: Reported on 12/03/2020)     escitalopram (LEXAPRO) 10 MG tablet Take 1 tablet (10 mg total) by mouth daily. (Patient not taking: Reported on 12/03/2020) 30 tablet 11   No current facility-administered medications on file prior to visit.    Allergies  Allergen Reactions   Ace Inhibitors Swelling   Tizanidine    Metformin And Related Other (See Comments)    CHILLS   Propofol Itching    Family History  Problem Relation Age of Onset   Stroke Father    Stroke Brother    Cancer Other    Gout Mother    Hypertension Mother    Arthritis Mother    Asthma Mother    Stroke Brother     BP (!) 158/78   Pulse 95   Ht 5' 5"  (1.651 m)   Wt (!) 320 lb 9.6 oz (145.4 kg)   SpO2 99%   BMI 53.35 kg/m   Review of Systems denies weight loss, blurry vision, sob, n/v, and urinary frequency.  No change in chronic depression or memory loss.    Objective:   Physical Exam GENERAL: no distress.  In [place].   EXT: feet are bandaged (seeds wound care).    Lab Results  Component Value Date   CREATININE 0.80 11/18/2019   BUN 15 11/18/2019   NA 138 11/18/2019   K 4.1 11/18/2019   CL 102 11/18/2019   CO2 25 11/18/2019    A1c=9.1%  I have reviewed outside records, and  summarized: Pt was noted to have elevated A1c, and referred here.  Pt was noted to be missing some med doses     Assessment & Plan:  Insulin-requiring type 2 DM: uncontrolled.  Memory loss: she should d/c multiple daily injections.   Patient Instructions  good diet and exercise significantly improve the control of your diabetes.  please let me know if you wish to be referred to a dietician.  high blood sugar is very risky to your health.  you should see an eye doctor and dentist every year.  It is very important to get all recommended vaccinations.  Controlling your blood pressure and cholesterol drastically reduces the damage diabetes does to your body.  Those who smoke should quit.  Please discuss these with your doctor.  check your blood sugar twice a day.  vary the time of day when you check, between before the 3 meals, and at bedtime.  also check if you have symptoms of your blood sugar being too high or too low.  please keep a record of the readings and bring it to your next appointment here (or you can bring the meter itself).  You can write it on any piece of paper.  please call us sooner if your blood sugar goes below 70, or if most of your readings are over 200.  I have sent a prescription to your pharmacy, to change the Novolog and glipizide to Lantus, 50 units each morning.  On this type of insulin schedule, you should eat meals on a regular schedule.  If a meal is missed or significantly delayed, your blood sugar could go low. Please call or message [place] next week, to tell us how the blood sugar is doing. Please come back for a follow-up appointment in 2 months.

## 2020-12-05 ENCOUNTER — Other Ambulatory Visit: Payer: Self-pay

## 2020-12-05 ENCOUNTER — Encounter (HOSPITAL_BASED_OUTPATIENT_CLINIC_OR_DEPARTMENT_OTHER): Payer: Medicare PPO | Admitting: Physician Assistant

## 2020-12-05 DIAGNOSIS — E11621 Type 2 diabetes mellitus with foot ulcer: Secondary | ICD-10-CM | POA: Diagnosis not present

## 2020-12-05 NOTE — Progress Notes (Addendum)
[name], [name] ([id number]) Visit Report for 12/05/2020 Chief Complaint Document Details Patient Name: Date of Service: [name], [name] 12/05/2020 10:45 A M Medical Record Number: [id number] Patient Account Number: [id number] Date of Birth/Sex: Treating RN: 1950-06-26 (70 y.o. [name] Primary Care Provider: [name] Other Clinician: Referring Provider: Treating Provider/Extender: Darlen Round in Treatment: 66 Information Obtained from: Patient Chief Complaint Bilateral reoccurring LE ulcers Electronic Signature(s) Signed: 12/05/2020 11:03:15 AM By: [name] PA-C Entered By: [name] on 12/05/2020 11:03:15 -------------------------------------------------------------------------------- HPI Details Patient Name: Date of Service: [name]. 12/05/2020 10:45 A M Medical Record Number: [id number] Patient Account Number: [id number] Date of Birth/Sex: Treating RN: 08/28/1950 (70 y.o. [name] Primary Care Provider: [name] Other Clinician: Referring Provider: Treating Provider/Extender: Darlen Round in Treatment: 15 History of Present Illness HPI Description: this patient has been seen a couple of times before and returns with recurrent problems to her right and left lower extremity with swelling and weeping ulcerations due to not wearing her compression stockings which she had been advised to do during her last discharge, at the end of June 2018. During her last visit the patient had had normal arterial blood flow and her venous reflux study did not necessitate any surgical intervention. She was recommended compression and elevation and wound care. After prolonged treatment the patient was completely healed but she has been noncompliant with wearing or compressions.. She was here last week with an outpatient return visit planned but the patient came in a very poor general condition with altered  mental status and was rushed to the ER on my request. With a history of hypertension, diabetes, TIA and right-sided weakness she was set up for an MRI on her brain and cervical spine and was sent to [facility]. Getting an MRI done was very difficult but once the workup was done she was found not to have any spinal stenosis, epidural abscess or hematoma or discitis. This was radiculopathy to be treated as an outpatient and she was given a follow-up appointment. Today she is feeling much better alert and oriented and has come to reevaluate her bilateral lower extremity lymphedema and ulceration 03/25/2017 -- she was admitted to the hospital on 03/16/2017 and discharged on 03/18/2017 with left leg cellulitis and ulceration. She was started on vancomycin and Zosyn and x-ray showed no bony involvement. She was treated for a cellulitis with IV antibiotics changed to Rocephin and Flagyl and was discharged on oral Keflex and doxycycline to complete a 7 day course. Last hemoglobin A1c was 7.1 and her other ailments including hypertension got asthma were appropriately treated. 05/06/2017 -- she is awaiting the right size of compression stockings from [place] but other than that has been doing well. ====== Old notes 70 year old patient was seen one time last October and was lost to follow-up. She has recurrent problems with weeping and ulceration of her left lower extremity and has swelling of this for several years. It has been worse for the last 2 months. Past medical history is significant for diabetes mellitus type 2, hypertension, gout, morbid obesity, depressive disorders, hiatal hernia, migraines, status post knee surgery, risk of a cholecystectomy, vaginal hysterectomy and breast biopsy. She is not a smoker. As noted before she has never had a venous duplex study and an arterial ABI study was attempted but the left lower extremity was noncompressible 10/01/2016 -- had a lower extremity venous duplex  reflux evaluation which showed  no evidence of deep vein reflux in the right or left lower extremity, and no evidence of great saphenous vein reflux more than 500 ms in the right or left lower extremity, and the left small saphenous vein is incompetent but no vascular consult was recommended. review of her electronic medical records noted that the ABI was checked in July 2017 where the right ABI was normal limits and the left ABI could not be ascertained due to pain with cuff pressure but the waveforms are within normal limits. her arterial duplex study scheduled for April 27. 10/08/2016 -- the patient has various reasons for not having a compression on and for the last 3 days she has had no compression on her left lower extremity either due to pain or the lack of nursing help. She does not use her juxta lites either. 10/15/2016 -- the patient did not keep her appointment for arterial duplex study on April 27 and I have asked her to reschedule this. Her pain is out of proportion with the physical findings and she continuously fails to wear a compression wraps and cuts them off because she says she cannot tolerate the pain. She does not use her juxta lites either. 10/22/2016 -- he has rescheduled her arterial duplex study to May 21 and her pain today is a bit better. She has not been wearing her juxta lites on her right lower extremity but now understands that she needs to do this. She did tolerate the to press compression wrap on her left lower extremity 10/29/2016 --arterial duplex study is scheduled for next week and overall she has been tolerating her compression wraps and also using her juxta lites on her right lower extremity 11/05/2016 -- the right ABI was 0.95 the left was 1.03. The digit TBI is on the right was 0.83 on the left was 0.92 and she had biphasic flow through these vessels. The impression was that of normal lower extremity arterial study. 11/12/2016 -- her pain is minimal and she  is doing very well overall. 11/26/2016 -- she has got juxta lites and her insurance will not pay for additional dual layer compression stockings. She is going to order some from [place]. 05/12/2017 -- her juxta lites are very old and too big for her and these have not been helping with compression. She did get 20-30 mm compression stockings from [place] but she and her husband are unable to put these on. I believe she will benefit from bilateral Extremit-ease, compression stockings and we will measure her for these today. 05/20/2017 -- lymphedema on the left lower extremity has increased a lot and she has a open ulceration as a result of this. The right lower extremity is looking pretty good. She has decided to by the compression stockings herself and will get reimbursed by the home health, at a later date. 05/27/2017 -- her sciatica is bothering her a lot and she thought her left leg pain was caused due to the compression wrap and hence removed it and has significant lymphedema. There is no inflammation on this left lower extremity. 06/17/17 on evaluation today patient appears to be doing very well and in fact is completely healed in regard to her ulcerations. Unfortunately however she does have continued issues with lymphedema nonetheless. We did order compression garments for her unfortunately she states that the size that she received were large although we ordered medium. Obviously this means she is not getting the optimal compression. She does not have those with her today and therefore  we could not confirm and contact the company on her behalf. Nonetheless she does state that she is going to have her husband bring them by tomorrow so that we can verify and then get in touch with the company. No fevers, chills, nausea, or vomiting noted at this time. Overall patient is doing better otherwise and I'm pleased with the progress she has made. 07/01/17 on evaluation today patient appears to be doing  very well in regard to her bilateral lower extremity she does not have any openings at this point which is excellent news. Overall I'm pleased with how things have progressed up to this time. Since she is doing so well we did order her compression which we are seeing her today to ensure that it fits her properly and everything is doing well in that regard and then subsequently she will be discharged. ============ Old Notes: 03/31/16 patient presents today for evaluation concerning open wounds that she has over the left medial ankle region as well as the left dorsal foot. She has previously had this occur although it has been healed for a number of months after having this for about a year prior until her hospitalization on 01/05/16. At that point in time it appears that she was admitted to the hospital for left lower extremity cellulitis and was placed on vancomycin and Zosyn at that point. Eventually upon discharge on January 15, 2016 she was placed on doxycycline at that point in time. Later on 03/27/16 positive wound culture growing Escherichia coli this was switched to amoxicillin. Currently she tells me that she is having pain radiated to be a 7 out of 10 which can be as high as 10 out of 10 with palpation and manipulation of the wound. This wound appears to be mainly venous in nature due to the bilateral lower extremity venous stasis/lymphedema. This is definitely much worse on her left than the right side. She does have type 1 diabetes mellitus, hypertension, morbid obesity, and is wheelchair dependent.during the course of the hospital stay a blood culture was also obtained and fortunately appeared negative. She also had an x-ray of the tibia/fibula on the left which showed no acute bone abnormality. Her white blood cell count which was performed last on 03/25/16 was 7.3, hemoglobin 12.8, protein 7.1, albumin 3.0. Her urine culture appeared to be negative for any specific organisms. Patient did  have a left lower extremity venous duplex evaluation for DVT . This did not include venous reflux studies but fortunately was negative for DVT Patient also had arterial studies performed which revealed that she had a . normal ABI on the right though this was unable to be performed on the left secondary to pain that she was having around the ankle region due to the wound. However it was stated on report that she had biphasic pulses and apparently good blood flow. ========== 06/03/17 she is here in follow-up evaluation for right lower extremity ulcer. The right lower sure he has healed but she has reopened to the left medial malleolus and dorsal foot with weeping. She is waiting for new compression garments to arrive from home health, the previous compression garments were ill fitting. We will continue with compression bilaterally and follow-up in 2 weeks Readmission: 08/05/17 on evaluation today patient appears to be doing somewhat poorly in regard to her left lower extremity especially although the right lower extremity has a small area which may no longer be open. She has been having a lot of drainage from the  left lower extremity however he tells me that she has not been able to use the EXTREMIT-EASE Compression at this point. She states that she did better and was able to actually apply the Juxta-Lite compression although the wound that she has is too large and therefore really does not compress which is why she cannot wear it at this point. She has no one who can help her put it on regular basis her son can sometimes but he's not able to do it most of the time. I do believe that's why she has begun to weave and have issues as she is currently yet again. No fevers, chills, nausea, or vomiting noted at this time. Patient is no evidence of dementia. 08/12/17 on evaluation today patient appears to still be doing fairly well in regard to the draining areas/weeping areas at this point. With that being  said she unfortunately did go to the ER yesterday due to what was felt to be possibly a cellulitis. They place her on doxycycline by mouth and discharge her home. She definitely was not admitted. With that being said she states she has had more discomfort which has been unusual for her even compared to prior times and she's had infections.08/12/17 on evaluation today patient appears to still be doing fairly well in regard to the draining areas/weeping areas at this point. With that being said she unfortunately did go to the ER yesterday due to what was felt to be possibly a cellulitis. They place her on doxycycline by mouth and discharge her home. She definitely was not admitted. With that being said she states she has had more discomfort which has been unusual for her even compared to prior times and she's had infections. 08/19/17 put evaluation today patient tells me that she's been having a lot of what sounds to be neuropathic type pain in regard to her left lower extremity. She has been using over-the-counter topical bins again which some believe. That in order to apply the she actually remove the wrap we put on her last Wednesday on Thursday. Subsequently she has not had anything on compression wise since that time. The good news is a lot of the weeping areas appear to have closed at this point again I believe she would do better with compression but we are struggling to get her to actually use what she needs to at this point. No fevers, chills, nausea, or vomiting noted at this time. 09/03/17 on evaluation today patient appears to be doing okay in regard to her lower extremities in regard to the lymphedema and weeping. Fortunately she does not seem to show any signs of infection at this point she does have a little bit of weeping occurring in the right medial malleolus area. With that being said this does not appear to be too significant which is good news. 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 She has severe skin damage secondary to both of these features involving the dorsal left foot and medial left ankle and lower leg. Still has open areas in the left anterior foot. The area on the right closed over. She uses her own juxta light stockings. She does not have an arterial issue 09/16/17 on evaluation today patient actually appears to be doing excellent in regard to her bilateral lower extremity swelling. The Juxta-Lite compression wrap seem to be doing very well for her. She has not however been using the portion that goes over her foot.  Her left foot still is draining a little bit not nearly as significant as it has been in the past but still I do believe that she likely needs to utilize the full wrap including the foot portion of this will improve as well. She also has been apparently putting on a significant amount of Vaseline which also think is not helpful for her. I recommended that if she feels she needs something for moisturizer Eucerin will probably be better. 09/30/17 on evaluation today patient presents with several new open areas in regard to her left lower extremity although these appear to be minimal and mainly seem to be more moisture breakdown than anything. Fortunately she does not seem to have any evidence of infection which is great news. She has been tolerating the dressing changes without complication we are using silver alginate on the foot she has been using AB pads to have the legs and using her Juxta- Lite compression which seems to be controlling her swelling very well. Overall I'm pleased with the poor way she has progressed. 10/14/17 on evaluation today patient appears to be doing better in regard to her left lower extremity areas of weeping. She does still have some discomfort although in general this does not appear to be as macerated and I think it is progressing nicely. I do think she still  needs to wear the foot portion of her Juxta- Lite in order to get the most benefit from the wrap obviously. She states she understands. Fortunately there does not appear to be evidence of infection at this time which is great news. 10/28/17 on evaluation today patient appears to be doing excellent in regard to her left lower extremity. She has just a couple areas that are still open and seem to be causing any trouble whatsoever. For that reason I think that she is definitely headed in the right direction the spots are very tiny compared to what we have been dealing with in the past. 11/11/17 on evaluation today patient appears to have a right lateral lower extremity ulcer that has opened since I last saw her. She states this is where the home health nurse that was coming out remove the dressing without wetting the alginate first. Nonetheless I do not know if this is indeed the case or not but more importantly we have not ordered home help to be coming out for her wounds at all. I'm unsure as to why they are coming out and we're gonna have to check on this and get things situated in that regard. With that being said we currently really do not need them to be coming out as the patient has been taking care of her leg herself without complication and no issues. In fact she was doing much better prior to nursing coming out. 11/25/17 on evaluation today patient actually appears to be doing fairly well in regard to her left lower extremity swelling. In fact she has very little area of weeping at this point there's just a small spot on the lateral portion of her right leg that still has me just a little bit more concerned as far as wanting to see this clear up before I discharge her to caring for this at home. Nonetheless overall she has made excellent progress. 12/09/17 on evaluation today patient appears to be doing rather well in regard to her lower extremity edema. She does have some weeping still in the left  lower extremity although the big area we were taking care of two  weeks ago actually has closed and she has another area of weeping on the left lower extremity immediately as well is the top of her foot. She does not currently have lymphedema pumps she has been wearing her compression daily on a regular basis as directed. With that being said I think she may benefit from lymphedema pumps. 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 We had a very difficult time getting and keeping this under control. 12/23/17 on evaluation today patient actually appears to be doing a little bit more poorly in regard to her bilateral lower extremities. She has been tolerating the Juxta-Lite compression wraps. Unfortunately she has two new ulcers on the right lower extremity and left lower Trinity ulceration seems to be larger. Obviously this is not good news. She has been tolerating the dressings without complication. 12/30/17 on evaluation today patient actually appears to be doing much better in regard to her bilateral lower extremity edema. She continues to have some issues with ulcerations and in fact there appears to be one spot on each leg where the wrap may have caused a little bit of a blister which is subsequently opened up at this point is given her pain. Fortunately it does not appear to be any evidence of infection which is good news. No fevers chills noted. 01/13/18 on evaluation today patient appears to be doing rather well in regard to her bilateral lower extremities. The dressings did get kind of stuck as far as the wound beds are concerned but again I think this is mainly due to the fact that she actually seems to be showing signs of healing which is good news. She's not having as much drainage therefore she was having more of the dressing sticking.  Nonetheless overall I feel like her swelling is dramatically down compared to previous. 01/20/18 on evaluation today patient unfortunately though she's doing better in most regards has a large blister on the left anterior lower extremity where she is draining quite significantly. Subsequently this is going to need debridement today in order to see what's underneath and ensure she does not continue to trapping fluid at this location. Nonetheless No fevers, chills, nausea, or vomiting noted at this time. 01/27/18 on evaluation today patient appears to be doing rather well at this point in regard to her right lower extremity there's just a very small area that she still has open at this point. With that being said I do believe that she is tolerating the compression wraps very well in making good progress. Home health is coming out at this point to see her. Her left lower extremity on the lateral portion is actually what still mainly open and causing her some discomfort for the most part 02/10/18 on evaluation today patient actually appears to be doing very well in regard to her right lower extremity were all the ulcers appear to be completely close. In regard to the left lower extremity she does have two areas still open and some leaking from the dorsal surface of her foot but this still seems to be doing much better to me in general. 02/24/18 on evaluation today patient actually appears to be doing much better in regard to her right lower extremity this is still completely healed. Her left lower extremity is also doing much better fortunately she has no evidence of infection. The one  area that is gonna require some debridement is still on the left anterior shin. Fortunately this is not hurting her as badly today. 03/10/18 on evaluation today patient appears to be doing better in some regards although she has a little bit more open area on the dorsal foot and she also has some issues on the medial portion of  the left lower extremity which is actually new and somewhat deep. With that being said there fortunately does not appear to be any significant signs of infection which is good news. No fevers, chills, nausea, or vomiting noted at this time. In general her swelling seems to be doing fairly well which is good news. 03/31/18 on evaluation today patient presents for follow-up concerning her left lower extremity lymphedema. Unfortunately she has been doing a little bit more poorly since I last saw her in regard to the amount of weeping that she is experiencing. She's also having some increased pain in the anterior shin location. Unfortunately I do not feel like the patient is making such good progress at this point a few weeks back she was definitely doing much better. 04/07/18 on evaluation today patient actually appears to be showing some signs of improvement as far as the left lower extremity is concerned. She has been tolerating the dressing changes and it does appear that the Drawtex did better for her. With that being said unfortunately home health is stating that they cannot obtain the Drawtex going forward. Nonetheless we're gonna have to check and see what they may be able to get the alginate they were using was getting stuck in causing new areas of skin being pulled all that with and subsequently weep and calls her to worsen overall this is the first time we've seen improvement at this time. 04/14/18 on evaluation today patient actually appears to be doing rather well at this point there does not appear to be any evidence of infection at this time and she is actually doing excellent in regard to the weeping in fact she almost has no openings remaining even compared to just last week this is a dramatic improvement. No fevers chills noted 04/21/18 evaluation today patient actually appears to be doing very well. She in fact is has a small area on the posterior lower extremity location and she has  a small area on the dorsal surface of her foot that are still open both of which are very close to closing. We're hoping this will be close shortly. She brought her Juxta-Lite wrap with her today hoping that would be able to put her in it unfortunately I don't think were quite at that point yet but we're getting closer. 04/28/18 upon evaluation today patient actually appears to be doing excellent in regard to her left lower extremity ulcer. In fact the region on the posterior lower extremity actually is much smaller than previously noted. Overall I'm very happy with the progress she has made. She again did bring her Juxta-Lite although we're not quite ready for that yet. 05/11/18 upon evaluation today patient actually appears to be doing in general fairly well in regard to her left lower Trinity. The swelling is very well controlled. With that being said she has a new area on the left anterior lower extremity as well as between the first and second toes of her left foot that was not present during the last evaluation. The region of her posterior left lower extremity actually appears to be almost completely healed. T be honest I'm very pleased  with o the way that stands. Nonetheless I do believe that the lotion may be keeping the area to moist as far as her legs are concerned subsequently I'm gonna consider discontinuing that today. 05/26/18 on evaluation today patient appears to be doing rather well in regard to her left lower should be ulcers. In fact everything appears to be close except for a very small area on the left posterior lower extremity. Fortunately there does not appear to be any evidence of infection at this time. Overall very pleased with her progress. 06/02/18 and evaluation today patient actually appears to be doing very well in regard to her lower extremity ulcers. She has one small area that still continues to weep that I think may benefit her being able to justify lotion and user  Juxta-Lite wraps versus continued to wrap her. Nonetheless I think this is something we can definitely look into at this point. 06/23/18 on evaluation today patient unfortunately has openings of her bilateral lower extremities. In general she seems to be doing much worse than when I last saw her just as far as her overall health standpoint is concerned. She states that her discomfort is mainly due to neuropathy she's not having any other issues otherwise. No fevers, chills, nausea, or vomiting noted at this time. 06/30/18 on evaluation today patient actually appears to be doing a little worse in regard to her right lower extremity her left lower extremity of doing fairly well. Fortunately there is no sign of infection at this time. She has been tolerating the dressing changes without complication. Home health did not come out like they were supposed to for the appropriate wrap changes. They stated that they never received the orders from [place] which were fax. Nonetheless we will send a copy of the orders with the patient today as well. 07/07/18 on evaluation today patient appears to be doing much better in regard to lower extremities. She still has several openings bilaterally although since I last saw her her legs did show obvious signs of infection when she later saw her nurse. Subsequently a culture was obtained and she is been placed on Bactrim and Keflex. Fortunately things seem to be looking much better it does appear she likely had an infection. Again last week we'd even discussed it but again there really was not any obvious sign that she had infection therefore we held off on the antibiotics. Nonetheless I'm glad she's doing better today. 07/14/18 on evaluation today patient appears to be doing much better regarding her bilateral lower Trinity's. In fact on the right lower for me there's nothing open at this point there are some dry skin areas at the sites where she had infection. Fortunately there is  no evidence of systemic infection which is excellent news. No fevers chills noted 07/21/18 on evaluation today patient actually appears to be doing much better in regard to her left lower extremity ulcers. She is making good progress and overall I feel like she's improving each time I see her. She's having no pain I do feel like the infection is completely resolved which is excellent news. No fevers, chills, nausea, or vomiting noted at this time. 07/28/18 on evaluation today patient appears to be doing very well in regard to her left lower [employer]. Everything seems to be showing signs of improvement which is excellent news. Overall very pleased with the progress that has been made. Fortunately there's no evidence of active infection at this time also excellent news. 08/04/18 on evaluation  today patient appears to be doing more poorly in regard to her bilateral lower extremities. She has two new areas open up on the right and these were completely closed as of last week. She still has the two spots on the left which in my pinion seem to be doing better. Fortunately there's no evidence of infection again at this point. 08/11/18 on evaluation today patient actually appears to be doing very well in regard to her bilateral lower Trinity wounds that all seem to be doing better and are measures smaller today. Fortunately there's no signs of infection. No fevers, chills, nausea, or vomiting noted at this time. 08/18/18 on evaluation today patient actually appears to be doing about the same inverter bilateral lower extremities. She continues to have areas that blistering open as was drain that fortunately nothing too significant. Overall I feel like Drawtex may have done better for her however compared to the collagen. 08/25/18 on evaluation today patient appears to be doing a little bit more poorly today even compared to last time I saw her. Again I'm not exactly sure why she's making worse progress over  the past several weeks. I'm beginning to wonder if there is some kind of underlying low level infection causing this issue. I did actually take a culture from the left anterior lower extremity but it was a new wound draining quite a bit at this point. Unfortunately she also seems to be having more pain which is what also makes me worried about the possibility of infection. This is despite never erythema noted at this point. 09/01/18 on evaluation today patient actually appears to be doing a little worse even compared to last week in regard to bilateral lower extremities. She did go to the hospital on the 16th was given a dose of IV Zosyn and then discharged with a recommendation to continue with the Bactrim that I previously prescribed for her. Nonetheless she is still having a lot of discomfort she tells me as well at this time. This is definitely unfortunate. No fevers, chills, nausea, or vomiting noted at this time. 09/08/18 on evaluation today patient's bilateral lower extremities actually appear to be shown signs of improvement which is good news. Fortunately there does not appear to be any signs of active infection I think the anabiotic is helping in this regard. Overall I'm very pleased with how she is progressing. 09/15/18 patient was actually seen in ER yesterday due to her legs as well unfortunately. She states she's been having a lot of pain and discomfort as well as a lot of drainage. Upon inspection today the patient does have a lot of swelling and drainage I feel like this is more related to lymphedema and poor fluid control than it is to infection based on what I'm seeing. The physician in the emergency department also doubted that the patient was having a significant infection nonetheless I see no evidence of infection obvious at this point although I do see evidence of poor fluid control. She still not using a compression pumps, she is not elevating due to her lift chair as well as her  hospital bed being broken, and she really is not keeping her legs up as much as they should be and also has been taking off her wraps. All this combined I think has led to poor fluid control and to be honest she may be somewhat volume overloaded in general as well. I recommend that she may need to contact your physician to see if a  prescription for a diuretic would be beneficial in their opinion. As long as this is safe I think it would likely help her. 09/29/18 on evaluation today patient's left lower extremity actually appears to be doing quite a bit better. At least compared to last time that I saw her. She still has a large area where she is draining from but there's a lot of new skin speckled trout and in fact there's more new skin that there are open areas of weeping and drainage at this point. This is good news. With regard to the right lower extremity this is doing much better with the only open area that I really see being a dry spot on the right lateral ankle currently. Fortunately there's no signs of active infection at this time which is good news. No fevers, chills, nausea, or vomiting noted at this time. The patient seems somewhat stressed and overwhelmed during the visit today she was very lethargic as such. She does and she is not taking any pain medications at this point. Apparently according to her husband are also in the process of moving which is probably taking its toll on her as well. 10/06/18 on evaluation today patient appears to be doing rather well in regard to her lower extremities compared to last evaluation. Fortunately there's no signs of active infection. She tells me she did have an appointment with her primary. Nonetheless he was concerned that the wounds were somewhat deep based on pictures but we never actually saw her legs. She states that he had her somewhat worried due to the fact that she was fearing now that she was [place] have to have an amputation. With that being  said based on what I'm seeing check she looks better this week that she has the last two times I've seen her with much less drainage I'm actually pleased in this regard. That doesn't mean that she's out of the water but again I do not think what the point of talking about education at all in regard to her leg. She is very happy to hear this. She is also not having as much pain as she was having last week. 10/13/18 unfortunately on evaluation today patient still continues to have a significant amount of drainage she's not letting home health actually apply the compression dressings at this point. She's trying to use of Juxta-Lite of the top of Kerlex and the second layer of the three layer compression wrap. With that being said she just does not seem to be making as good a progress as I would expect if she was having the compression applied and in place on a regular basis. No fevers, chills, nausea, or vomiting noted at this time. 10/20/18 on evaluation today patient appears to be doing a little better in regard to her bilateral lower extremity ulcers. In fact the right lower extremity seems to be healed she doesn't even have any openings at this point left lower extremity though still somewhat macerated seems to be showing signs of new skin growth at multiple locations throughout. Fortunately there's no evidence of active infection at this time. No fevers, chills, nausea, or vomiting noted at this time. 10/27/18 on evaluation today patient appears to be doing much better in regard to her left lower Trinity ulcer. She's been tolerating the laptop complication and has minimal drainage noted at this point. Fortunately there's no signs of active infection at this time. No fevers, chills, nausea, or vomiting noted at this time. 11/03/18 on evaluation today  patient actually appears to be doing excellent in regard to her left lower extremity. She is having very little drainage at this point there does not appear  to be any significant signs of infection overall very pleased with how things have gone. She is likewise extremely pleased still and seems to be making wonderful progress week to week. I do believe antibiotics were helpful for her. Her primary care provider did place on amateur clean since I last saw her. 11/17/18 on evaluation today patient appears to be doing worse in regard to her bilateral lower extremities at this point. She is been tolerating the dressing changes without complication. With that being said she typically takes the Coban off fairly quickly upon arriving home even after being seen here in the clinic and does not allow home health reapply command as part of the dressing at home. Therefore she said no compression essentially since I last saw her as best I can tell. With that being said I think it shows and how much swelling she has in the open wounds that are noted at this point. Fortunately there's no signs of infection but unfortunately if she doesn't get this under control I think she will end up with infection and more significant issues. 11/24/18 on evaluation today patient actually appears to be doing somewhat better in regard to her bilateral lower extremities. 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 Nonetheless that is doing better currently so she should be able to attempt reinitiating the lymphedema pumps at this time. No fevers, chills, nausea, or vomiting noted at this time. 12/01/18 upon evaluation today patient's left lower extremity appears to be doing quite well unfortunately her right lower extremity is not doing nearly as well. She has been tolerating the dressing changes without complication unfortunately she did not keep a wrap on the right at this time. Nonetheless I believe this has led to increased swelling and weeping in  the world is actually much larger than during the last evaluation with her. 12/08/18 on evaluation today patient appears to be doing about the same at this point in regard to her right lower extremity. There is some more palatable to touch I'm concerned about the possibility of there being some infection although I think the main issue is she's not keeping her compression wrap on which in turn is not allowing this area to heal appropriately. 12/22/18 on evaluation today patient appears to be doing better in regard to left lower extremity unfortunately significantly worse in regard to the right lower extremity. The areas of blistering and necrotic superficial tissue have spread and again this does not really appear to be signs of infection and all she just doesn't seem to be doing nearly as well is what she has been in the past. Overall I feel like the Augmentin did absolutely nothing for her she doesn't seem to have any infection again I really didn't think so last time either is more of a potential preventative measure and hoping that this would make some difference but I think the main issue is she's not wearing her compression. She tells me she cannot wear the [place] been we put on she takes it off pretty much upon getting home. Subsequently she worshiped Juxta-Lite when I questioned her about how often she wears it this is no  more than three hours a day obviously that leaves 21 hours that she has no compression and this is obviously not doing well for her. Overall I'm concerned that if things continue to worsen she is at great risk of both infection as well as losing her leg. 01/05/19 on evaluation today patient appears to be doing well in regard to her left lower extremity which he is allowing [place] to wrap and not so well with regard to her right lower extremity which she is not allowing [place] to really wrap and keep the wrap on. She states that it hurts too badly whenever it's wrapped and she ends up  having to take it off. She's been using the Juxta-Lite she tells me up to six hours a day although I question whether or not that's really been the case to be honest. Previously she told me three hours today nonetheless obviously the legs as long as the wrap is doing great when she is not is doing much more poorly. 01/12/2019 on evaluation today patient actually appears to be doing a little better in my opinion with regard to her right lower extremity ulcer. She has a small open area on the left lower extremity unfortunately but again this I think is part of the normal fluctuation of what she is going to have to expect with regard to her legs especially when she is not using her lymphedema pumps on a regular basis. Subsequently based on what I am seeing today I think that she does seem to be doing slightly better with regard to her right lower extremity she did see her primary care provider on Monday they felt she had an infection and placed her on 2 antibiotics. Both Cipro and clindamycin. Subsequently again she seems possibly to be doing a little bit better in regards to the right lower extremity she also tells me however she has been wearing the compression wrap over the past week since I spoke with her as well that is a Kerlix and Coban wrap on the right. No fevers, chills, nausea, vomiting, or diarrhea. 01/19/2019 on evaluation today patient appears to be doing better with regard to her bilateral lower extremities especially the right. I feel like the compression has been beneficial for her which is great news. She did get a call from her primary care provider on her way here today telling her that she did have methicillin- resistant Staphylococcus aureus and he was calling in a couple new antibiotics for her including a ointment to be applied she tells me 3 times a day. With that being said this sounds like likely to be Bactroban which I think could be applied with each dressing/wrap change but I  would not be able to accommodate her applying this 3 times a day. She is in agreement with the least doing this we will add that to her orders today. 01/26/2019 on evaluation today patient actually appears to be doing much better with regard to her right lower extremity. Her left lower extremity is also doing quite well all things considering. Fortunately there is no evidence of active infection at this time. No fevers, chills, nausea, vomiting, or diarrhea. 02/02/2019 on evaluation today patient appears to be doing much better compared to her last evaluation. Little by little off like her right leg is returning more towards normal. There does not appear to be any signs of active infection and overall she seems to be doing quite well which is great news. I am very pleased  in this regard. No fevers, chills, nausea, vomiting, or diarrhea. 02/09/2019 upon evaluation today patient appears to be doing better with regard to her bilateral lower extremities. She has been tolerating the dressing changes without complication. Fortunately there is no signs of active infection at this time. No fevers, chills, nausea, vomiting, or diarrhea. 02/23/2019 on evaluation today patient actually appears to be doing quite well with regard to her bilateral lower extremities. She has been tolerating the dressing changes without complication. She is even used her pumps one time and states that she really felt like it felt good. With that being said she seems to be in good spirits and her legs appear to be doing excellent. 03/09/2019 on evaluation today patient appears to be doing well with regard to her right lower extremity there are no open wounds at this time she is having some discomfort but I feel like this is more neuropathy than anything. With regard to her left lower extremity she had several areas scattered around that she does have some weeping and drainage from but again overall she does not appear to be having any  significant issues and no evidence of infection at this time which is good news. 03/23/2019 on evaluation today patient appears to be doing well with regard to her right lower extremity which she tells me is still close she is using her juxta light here. Her left lower extremity she mainly just has an area on the foot which is still slightly draining although this also is doing great. Overall very pleased at this time. 04/06/2019 patient appears to be doing a little bit worse in regard to her left lower extremity upon evaluation today. She feels like this could be becoming infected again which she had issues with previous. Fortunately there is no signs of systemic infection but again this is always a struggle with her with her legs she will go from doing well to not so well in a very short amount of time. 04/20/2019 on evaluation today patient actually appears to be doing quite well with regard to her right lower extremity I do not see any signs of active infection at this time. Fortunately there is no fever chills noted. She is still taking the antibiotics which I prescribed for her at this point. In regard to the left lower extremity I do feel like some of these areas are better although again she still is having weeping from several locations at this time. 04/27/2019 on evaluation today patient appears to be doing about the same if not slightly worse in regard to her left lower extremity ulcers. She tells me when questioned that she has been sleeping in her Hoveround chair in fact she tells me she falls asleep without even knowing it. I think she is spending a whole lot of time in the chair and less time walking and moving around which is not good for her legs either. On top of that she is in a seated position which is also the worst position she is not really elevating her legs and she is also not using her lymphedema pumps. All this is good to contribute to worsening of her condition in  general. 05/18/2019 on evaluation today patient appears to be doing well with regard to her lower extremity on the right in fact this is showing no signs of any open wounds at this time. On the left she is continuing to have issues with areas that do drain. Some of the regions have healed and  there are couple areas that have reopened. She did go to the ER per the patient according to recommendations from the home health nurse due to what she was seen when she came out on 05/13/2019. Subsequently she felt like the patient needed to go to the hospital due to the fact that again she was having "milky white discharge" from her leg. Nonetheless she had and then was placed on doxycycline and subsequently seems to be doing better. 06/01/2019 upon evaluation today patient appears to be doing really in my opinion about the same. I do not see any signs of active infection which is good news. Overall she still has wounds over the bilateral lower extremities she has reopened on the right but this appears to be more of a crack where there is weeping/edema coming from the region. I do not see any evidence of infection at either site based on what I visualized today. 07/13/2019 upon evaluation today patient appears to be doing a little worse compared to last time I saw her. She since has been in the hospital from 06/21/2019 through 06/29/2019. This was secondary to having Covid. During that time they did apply lotion to her legs which unfortunately has caused her to develop a myriad of open wounds on her lower extremities. Her legs do appear to be doing better as far as the overall appearance is concerned but nonetheless she does have more open and weeping areas. 07/27/2019 upon evaluation today patient appears to be doing more poorly to be honest in regard to her left lower extremity in particular. There is no signs of systemic infection although I do believe she may have local infection. She notes she has been having  a lot of blue/green drainage which is consistent potentially with Pseudomonas. That may be something that we need to consider here as well. The doxycycline does not seem to have been helping. 08/03/2019 upon evaluation today patient appears to be doing a little better in my opinion compared to last week's evaluation. Her culture I did review today and she is on appropriate medications to help treat the Enterobacter that was noted. Overall I feel like that is good news. With that being said she is unfortunately continuing to have a lot of drainage and though it is doing better I still think she has a long ways to go to get things dried up in general. Fortunately there is no signs of systemic infection. 08/10/2019 upon evaluation today patient appears to be doing may be slightly better in regard to her left lower extremity the right lower extremity is doing much better. Fortunately there is no signs of infection right now which is good news. No fevers, chills, nausea, vomiting, or diarrhea. 08/24/2019 on evaluation today patient appears to be doing slightly better in regard to her lower extremities. The left lower extremity seems to be healed the right lower extremity is doing better though not completely healed as far as the openings are concerned. She has some generalized issues here with edema and weeping secondary to her lymphedema though again I do believe this is little bit drier compared to prior weeks evaluations. In general I am very pleased with how things seem to be progressing. No fevers, chills, nausea, vomiting, or diarrhea. 08/31/2019 upon evaluation today patient actually seems to making some progress here with regard to the left lower extremity in particular. She has been tolerating the dressing changes without complication. Fortunately there is no signs of active infection at this time. No fevers,  chills, nausea, vomiting, or diarrhea. She did see Dr. Doren Custard and he did note that she did have  a issue with the left great saphenous vein and the small saphenous vein in the leg. With that being said he was concerned about the possibility of laser ablation not being extremely successful. He also mentioned a small risk of DVT associated with the procedure. However if the wounds do not continue to improve he stated that that would probably be the way to go. Fortunately the patient's legs do seem to be doing much better. 09/07/2019 upon evaluation today patient appears to be doing better with regard to her lower extremities. She has been tolerating the dressing changes without complication. With that being said she is showing signs of improvement and overall very pleased. There are some areas on her leg that I think we do need to debride we discussed this last week the patient is in agreement with doing that as long as it does not hurt too badly. 09/14/2019 upon evaluation today patient appears to be doing decently well with regard to her left lower extremity. She is not having near as much weeping as she has had in the past things seem to be drying up which is good news. There is no signs of active infection at this time. 09/21/19 upon evaluation today patient appears to be doing better in regard overall to her bilateral lower extremities. She again has less open than she did previous and each week I feel like this is getting better. Fortunately there is no signs of active infection at this time. No fevers, chills, nausea, vomiting, or diarrhea. 09/28/2019 upon evaluation today patient actually appears to be showing signs of improvement with regard to her left lower extremity. Unfortunately the right medial lower extremity around the ankle region has reopened to some degree but this appears to be minimal still which is good news. There is no signs of active infection at this time which is also good news. 10/12/2019 upon evaluation today patient appears to be doing okay with regard to her bilateral  lower extremities today. The right is a little bit worse then last evaluation 2 weeks ago. The left is actually doing a little better in my opinion. Overall there is no signs of active infection at this time that I see. Obviously that something we have to keep a close eye on she is very prone to this with the significant and multiple openings that she has over the bilateral lower extremities. 10/19/2019 upon evaluation today patient appears to be doing about the best that I have seen her in quite some time. She has been tolerating the dressing changes without complication. There does not appear to be any signs of active infection and overall I am extremely happy with the way her legs appeared. She is drying up quite nicely and overall is having less pain. 11/09/2019 upon evaluation today patient appears to be doing better in regard to her wounds. She seems to be drying up more and more each time I see her this is just taking a very long time. Fortunately there is no signs of active infection at this time. 11/23/2019 upon evaluation today patient actually appears to be doing excellent in regard to her lower extremities at this point compared to where she has been. Fortunately there is no signs of active infection at this time. She did go to the hospital last week for nausea and vomiting completely unrelated to her wounds. Fortunately she is doing better  she was given some Reglan and got better. She had associated abdominal pain but they never found out what was going on. 12/07/2019 upon evaluation today patient appears to be doing well for the most part in regard to her legs. She unfortunately has not been keeping the [place] portion of her wraps on therefore the compression has not really been sufficient for what it is supposed to be. Nonetheless she tells me that it just hurt too bad therefore she removed it. 12/21/2019 upon evaluation today patient actually appears to be doing quite well with regard to her  legs. I do feel like she has been making progress which is great news and overall there is no signs of active infection at this time. No fevers, chills, nausea, vomiting, or diarrhea. 01/04/2020 upon evaluation today patient presents for follow-up concerning her lower extremity edema bilaterally. She still has open wounds she has not been using her lymphedema pumps. She is also not been utilizing her compression wraps appropriately she tends to unwrap them, take them off, or states that they hurt. Obviously the reason they hurt is because her legs start to swell but the issue is if she would use her compression/lymphedema pumps regularly she would not swell and she would have the pain. Nonetheless she has not even picked them up once honestly over the past several months and may be even as much as in the past year based on my opinion and what have seen. She tells me today that after last week when I talked about this with her specifically actually that was 2 weeks ago that she "forgot". 8//21 on evaluation today patient appears to be doing a little better in regard to her legs bilaterally. Fortunately there is no signs of active infection at this time. She tells me that she used her lymphedema pumps all of one time over the past 2 weeks since I last saw her. She tells me that she has been too busy in order to continue to use these. 02/01/2020 on evaluation today patient appears to be doing some better in regard to her wounds in general in her legs. We felt the right was healed although is not completely it does appear to be doing better she tells me she has been using her lymphedema pumps that she has had this six times since I last saw her. Obviously the more she does that the better she would do my opinion 02/15/2020 upon evaluation today patient appears to be doing about the same in regard to her legs. She tells me that she is pumping I'm still not sure how much she does to be perfectly honest. However  even if she does a little bit here and there I guess that is better than nothing. Fortunately there is no sign of active infection at this time which is great news. No fevers, chills, nausea, vomiting, or diarrhea. 02/29/2020 on evaluation today patient actually appears to be doing quite well all things considered this week. She has been tolerating the dressing changes without complication. Fortunately there is no signs of active infection at this time. No fevers, chills, nausea, vomiting, or diarrhea. 03/14/2020 upon evaluation today patient appears to be doing really about the same in regard to her legs. There is no signs of improvement overall and she as noted from home health does not appear to be elevating her legs he can get into her lift chair. There is too much stuff piled up on it the patient tells me. She also  tells me she cannot really use her pumps effectively due to the fact that she cannot have any space to get them on. Finally she is also not really elevating her legs because she is not sleeping in her bed she is sleeping in her chair currently and again overall I think everything that she is done in combination has been exactly the wrong thing for what she needs for her legs. 04/04/2020 upon evaluation today patient appears to be doing well at this time with regard to her legs. She is actually been pumping, keeping her wraps on, and to be honest she seems to be doing dramatically better the right leg is excellent the left leg is also excellent and measuring much smaller than previous. 04/18/2020 upon evaluation today patient actually is continue to make good progress in regard to her lower extremities bilaterally. Everything is improving and less wet that has been in the past overall I am extremely pleased with where things stand and I think that she is making great progress. The patient tells me she still continue to use her compression pumps 05/02/2020 on evaluation today patient appears  to be doing well at this time in regard to her left leg which is showing signs of drying up. With that being said she does have a lot of lymphedema type crusty skin around the toes of her left foot and the right medial ankle which has opened at this point. Fortunately there is no signs of active infection systemically at this point or even locally for that matter. 05/23/2020 on evaluation today patient appears to be doing well with regard to her lower extremities. Fortunately there is no signs of active infection at this time. No fever chills noted. She has been very depressed however she tells me. 06/06/2020 patient came in today for evaluation in regard to her bilateral lower extremity ulcerations. With that being said she came in feeling okay and actually laughing and joking around with the staff checking her in. Subsequently however she had a coughing spell and following the coughing spell it was a dramatic conversion from being jovial and joking around to being extremely short of breath her vital signs actually dropped in regard to her blood pressure from around 175 to down around 433 for systolic and from around 96 diastolic down to around 70. With that being said she also accompanied this with an increase in her respiratory rate which was also quite significant. Nonetheless I actually upon going into see her was extremely worried we called EMS to have her transported to the ER for further evaluation and treatment. She continued until EMS got here to be extremely short of breath even on 6 L of oxygen. Her oxygen saturation did come up to 99% but overall it was only 95 before which was not terrible she did feel like the oxygen helped her feel somewhat better however. She has a history of asthma but again even the coughing spell really should not have done this degree of alteration in her demeanor from where she was just before to after the coughing spell. She tells [place] however she has been feeling  somewhat abnormal since Friday although she could not really pinpoint exactly what was going on. 06/27/2020 plan evaluation today patient appears to be doing okay in regard to her leg ulcers. She is really not showing a lot of improvement to be honest and she still has a lot of weeping. With that being said after I last saw her we called  EMS to take her to the hospital she actually did not go she went to see her primary care provider who according to the patient have not identified and read through the note states that everything checked out okay. Nonetheless the patient has continued to have bouts where she gets very short of breath for seemingly no reason whatsoever. That happened even the same night that I saw her last time. This was after she refused transport to the hospital. With that being said she also tells me that just getting dressed to come here is quite a chore which is putting on her shirt causing her to have to stop to catch her breath. Obviously this is not normal and I feel like there is something going on. She may need a referral to be seen by cardiology ASAP. She does see her primary care provider early next week she tells me 07/11/2020 upon evaluation today patient's wounds again appear to be doing about the same she mainly has weeping of the bilateral lower extremities both are very swollen today she tells me she has been using her pumps but last time I saw her she told me she did not even have her pumps out that she had "packed them away because they had too much stuff in their house. Apparently her husband has been bringing stuff in from storage units. She then subsequently told me that she had so much stuff in her house that she has been staying up all night and did not go to bed till 7:00 this morning because she was cleaning up stuff. Nonetheless I am still unsure as to whether or not she is using the pumps based on what I am seeing I would think probably not. Nonetheless she  has been keeping the compression wraps in place that is at least something 07/25/2020 upon evaluation today patient appears to be doing well currently in regard to her legs all things considered. I do not think she is doing any worse significantly although honestly I do not think she is doing a lot better either. There does not appear to be any signs of infection which is good news although she does have a lot of weeping. She is using her pumps she tells me sporadically though she says that her husband is not a big fan of helping her to get the [place]. She also tells me is difficult because in their house. It would be easiest for her to use these as where her son is actually staying with her and living out at this point. Obviously I understand that she is having a lot of issues here and to be honest I do not really know what to say in that regard and the fact that I really cannot change the situations but I do believe she really needs to be using the lymphedema pumps to try to keep things under control here. Outside of that I think she is going to continue to have significant issues. I think the pumps coupled with good compression and elevation are the only things that are to help her at this point. 08/08/2020 upon evaluation today patient appears to be doing a little worse in regard to her legs in general. I think this may be due to some infection currently. With that being said I am going to go ahead and likely see about putting her on an antibiotic. Were also can obtain a culture today where she had some purulent drainage. 08/29/2020 upon evaluation today patient appears to  be doing about the same in regard to her bilateral lower extremities. Unfortunately she is continuing to have significant issues here with edema and she has significant lymphedema. With that being said she is really not doing anything that she is supposed to be doing as far as elevation, compression, using lymphedema pumps or  anything really for that matter. She does not use her pumps, is taken the wraps off shortly after having them put on as far as home health is concerned at least by the next day she tells me, and overall is not really elevating her legs she is telling me she has a lot to do as far as cleaning up in her house. Nonetheless this triad of noncompliance is leading to worsening not improving in regard to her legs. I had a very strong conversation with her about this today again. 09/12/2020 on evaluation today patient appears to be doing poorly with regard to her lower extremities bilaterally. Fortunately there does not appear to be any signs of active infection at this point. That is systemically. Locally there is some signs of pus coming from an area on her left leg. She has been on antibiotics recently though she is done with those currently. Nonetheless she tells me she has been having increased pain with the right leg this is extremely swollen as well. Fortunately there does not appear to be significant infection of the right leg though I think her pain is really as result of the increased swelling she has been declining the wrap on the right leg and the wounds here are getting significantly worse week by week. This obviously is not will be want to see. I discussed with the patient however if she does not use the wraps, use her pumps, and elevate her legs that she is not to get better. She has been sleeping in her motorized Hoveround which does not even allow her to elevate her legs at all. She tells me that she is also constantly "cleaning up as her husband told her that he wants to move back to [place]." Unfortunately this has been the narrative for the past several months in fact that she tells me that she has been having to clean up mass that was moved in from storage units into their home and that she does not really sleep and she never really elevates her legs. I explained to the patient today  that if she is not can I do any of this that there is really nothing that I can do to help her. 09/26/20 upon evaluation today patient appears to be doing about the same in regard to her wounds. Fortunately there is no signs of active infection at this time. No fever chills noted. She has been tolerating the dressing changes without complication which is good news. With that being said I do think that she unfortunately is still having a lot of pain but I think this is due to the swelling that really is not controlled and as I discussed with her last time she needs to be using her lymphedema pumps there is still in the box she has not even attempted to use those whatsoever. I am also not even certain she is taking her fluid pills as she states she may need to have "get a refill" she was seen with her husband at the appointment today. 10/24/2020 upon evaluation today patient appears to be doing decently well in regard to her legs all things considered. She has been tolerating  the dressing changes without complication. Fortunately there is no signs of active infection at this time. No fevers, chills, nausea, vomiting, or diarrhea. With that being said the patient does appear to have some excessive thickened skin buildup currently that has been require some sharp debridement to clear this away. I am hopeful if we do this we will be able to get some of this area to dry out this otherwise trapping fluid underneath. Her son is present during the office visit today. She tells me she still been doing a lot as far as cleaning up and going through boxes she tells me she has been up for a long time already this morning. 5/25; patient presents for 2-week follow-up. She had 3 layer compression wrap placed with calcium alginate underneath. She reports tolerating the wraps well. She has home health that changes the wrap 2-3 times a week. She denies signs of infection. She has almost finished her course of antibiotics. She  uses her lymphedema pumps once weekly. 11/21/2020 upon evaluation today patient appears to be doing well with regard to her legs all things considered. Fortunately there is no signs of active infection at this time which is great news I do not see any evidence of infection and overall I think that she is definitely improved compared to where things did previous. Nonetheless I do think that she may benefit from removing some of the thicker skin on the legs. Again we have attempted this in the past to some degree but I think that we might be able to more effectively do this of a clear some way and then potentially use a urea cream with salicylic acid to try to help clear some of this away. The patient is in agreement with the plan. 12/05/2020 upon evaluation today patient appears to be doing well with regard to her wounds in general. I feel like that things are somewhat improved the skin looking a little better. With that being said she still has significant lymphedema. She is not been using her lymphedema pumps and not really been elevating her legs. She also did have some maggots noted on the left leg she tells me that the "gnats are terrible and get all over her foot." With that being said I am concerned about the fact that the patient still is quite swollen even with the compression wraps I really feel like she needs to be using her pumps but I cannot talk her into doing this. Electronic Signature(s) Signed: 12/05/2020 12:10:32 PM By: [name] PA-C Entered By: [name] on 12/05/2020 12:10:32 -------------------------------------------------------------------------------- Physical Exam Details Patient Name: Date of Service: NELLI, SWALLEY 12/05/2020 10:45 A M Medical Record Number: [id number] Patient Account Number: [id number] Date of Birth/Sex: Treating RN: 11-11-1950 (70 y.o. [name] Primary Care Provider: [name] Other Clinician: Referring Provider: Treating  Provider/Extender: Darlen Round in Treatment: 50 Constitutional Well-nourished and well-hydrated in no acute distress. Respiratory normal breathing without difficulty. Psychiatric this patient is able to make decisions and demonstrates good insight into disease process. Alert and Oriented x 3. pleasant and cooperative. Notes Upon inspection patient's wound bed actually showed signs again of some improvement with regard to the skin overall condition all especially on the left leg but in general she is really not doing a whole lot better than she was previous. She is also really not doing much to help her self from the standpoint of elevation and the use of her lymphedema pumps. Electronic  Signature(s) Signed: 12/05/2020 12:10:54 PM By: [name] PA-C Entered By: [name] on 12/05/2020 12:10:54 -------------------------------------------------------------------------------- Physician Orders Details Patient Name: Date of Service: [name] 12/05/2020 10:45 A M Medical Record Number: [id number] Patient Account Number: [id number] Date of Birth/Sex: Treating RN: [birth year] (70 y.o. [name] Primary Care Provider: [name] Other Clinician: Referring Provider: Treating Provider/Extender: Darlen Round in Treatment: [phone] Verbal / Phone Orders: No Diagnosis Coding ICD-10 Coding Code Description E11.622 Type 2 diabetes mellitus with other skin ulcer I89.0 Lymphedema, not elsewhere classified I87.331 Chronic venous hypertension (idiopathic) with ulcer and inflammation of right lower extremity I87.332 Chronic venous hypertension (idiopathic) with ulcer and inflammation of left lower extremity L97.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 leg with fat layer exposed L97.822 Non-pressure chronic ulcer of other part of left lower leg with fat layer 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 Bathing/ Shower/ Hygiene May shower and wash wound with soap and water. - with dressing changes, wash both legs with wash cloth and soap and water with dressing changes Other Bathing/Shower/Hygiene Orders/Instructions: - Home health to wash legs with warn soapy water and a clean wash cloth with dressing changes Edema Control - Lymphedema / SCD / Other Bilateral Lower Extremities Lymphedema Pumps. Use Lymphedema pumps on leg(s) 2-3 times a day for 45-60 minutes. If wearing any wraps or hose, do not remove them. Continue exercising as instructed. Elevate legs to the level of the heart or above for 30 minutes daily and/or when sitting, a frequency of: - especially at night Avoid standing for long periods of time. Exercise regularly Home Health No change in wound care orders this week; continue Home Health for wound care. May utilize formulary equivalent dressing for wound treatment orders unless otherwise specified. - add urea cream to legs with dressing changes when available Dressing changes to be completed by [place] on Monday / Wednesday / Friday except when patient has scheduled visit at [facility]. Other Home Health Orders/Instructions: - [facility]ound [facility]61 - Lower Leg Wound Laterality: Left, Circumferential Peri-Wound Care: Sween Lotion (Moisturizing lotion) (Home Health) 3 x Per Week/30 Days Discharge Instructions: Apply moisturizing lotion to dry skin on legs Peri-Wound Care: Urea Cream 40%, tube, 1 (oz) 3 x Per Week/30 Days Discharge Instructions: apply to lower legs with dressing changes Prim Dressing: Maxorb Extra Calcium Alginate 2x2 in (Home Health) 3 x Per Week/30 Days ary Discharge Instructions: Apply calcium  alginate to wound bed as instructed Secondary Dressing: ABD Pad, 8x10 (Home Health) 3 x Per Week/30 Days Discharge Instructions: Apply over primary dressing as directed. Secondary Dressing: Zetuvit Plus 4x4 in (Home Health) 3 x Per Week/30 Days Discharge Instructions: or equivalent extra absorbent pad.Apply over primary dressing as directed. Compression Wrap: ThreePress (3 layer compression wrap) (Home Health) 3 x Per Week/30 Days Discharge Instructions: Apply three layer compression as directed. Pad bend of ankle with foam or ABD pad. Wound #64 - Foot Wound Laterality: Dorsal, Left Peri-Wound Care: Sween Lotion (Moisturizing lotion) (Home Health) 3 x Per Week/30 Days Discharge Instructions: Apply moisturizing lotion to dry skin on legs Peri-Wound Care: Urea Cream 40%, tube, 1 (oz) 3 x Per Week/30 Days Discharge Instructions: apply to lower legs with dressing changes Prim Dressing:  Maxorb Extra Calcium Alginate 2x2 in (Home Health) 3 x Per Week/30 Days ary Discharge Instructions: Apply calcium alginate to wound bed as instructed Secondary Dressing: ABD Pad, 8x10 (Home Health) 3 x Per Week/30 Days Discharge Instructions: Apply over primary dressing, also cover toes with ABD pad for closed dressing Secondary Dressing: Zetuvit Plus 4x4 in (Home Health) 3 x Per Week/30 Days Discharge Instructions: or equivalent extra absorbent pad.Apply over primary dressing as directed. Compression Wrap: ThreePress (3 layer compression wrap) (Home Health) 3 x Per Week/30 Days Discharge Instructions: Apply three layer compression as directed. Pad bend of ankle with foam or ABD pad. Wound #67 - Lower Leg Wound Laterality: Right, Medial Peri-Wound Care: Sween Lotion (Moisturizing lotion) (Home Health) 3 x Per Week/30 Days Discharge Instructions: Apply moisturizing lotion to dry skin on legs Peri-Wound Care: Urea Cream 40%, tube, 1 (oz) 3 x Per Week/30 Days Discharge Instructions: apply to lower legs with dressing  changes Prim Dressing: Maxorb Extra Calcium Alginate 2x2 in (Home Health) 3 x Per Week/30 Days ary Discharge Instructions: Apply calcium alginate to wound bed as instructed Secondary Dressing: ABD Pad, 8x10 (Home Health) 3 x Per Week/30 Days Discharge Instructions: Apply over primary dressing, also cover toes with ABD pad for closed dressing Secondary Dressing: Zetuvit Plus 4x4 in (Home Health) 3 x Per Week/30 Days Discharge Instructions: or equivalent extra absorbent pad.Apply over primary dressing as directed. Compression Wrap: ThreePress (3 layer compression wrap) (Home Health) 3 x Per Week/30 Days Discharge Instructions: Apply three layer compression as directed. Pad bend of ankle with foam or ABD pad. Electronic Signature(s) Signed: 12/05/2020 4:01:47 PM By: [name] PA-C Signed: 12/05/2020 5:44:00 PM By: [name] RN, BSN Entered By: [name] on 12/05/2020 12:08:11 -------------------------------------------------------------------------------- Problem List Details Patient Name: Date of Service: [name]. 12/05/2020 10:45 A M Medical Record Number: [id number] Patient Account Number: [id number] Date of Birth/Sex: Treating RN: 1951-03-12 (70 y.o. Martyn Malay, Vaughan Basta Primary Care Provider: [name] Other Clinician: Referring Provider: Treating Provider/Extender: Darlen Round in Treatment: [phone] Active Problems ICD-10 Encounter Code Description Active Date MDM Diagnosis E11.622 Type 2 diabetes mellitus with other skin ulcer 08/05/2017 No Yes I89.0 Lymphedema, not elsewhere classified 08/05/2017 No Yes I87.331 Chronic venous hypertension (idiopathic) with ulcer and inflammation of right 08/05/2017 No Yes lower extremity I87.332 Chronic venous hypertension (idiopathic) with ulcer and inflammation of left 08/05/2017 No Yes lower extremity L97.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 leg with fat layer 08/05/2017 No  Yes exposed L97.822 Non-pressure chronic ulcer of other part of left lower leg with fat layer exposed2/20/2019 No Yes L97.522 Non-pressure chronic ulcer of other part of left foot with fat layer exposed 06/01/2019 No Yes I10 Essential (primary) hypertension 08/05/2017 No Yes E66.01 Morbid (severe) obesity due to excess calories 08/05/2017 No Yes F41.8 Other specified anxiety disorders 08/05/2017 No Yes R53.1 Weakness 08/05/2017 No Yes Inactive Problems Resolved Problems Electronic Signature(s) Signed: 12/05/2020 11:03:05 AM By: [name] PA-C Entered By: [name] on 12/05/2020 11:03:04 -------------------------------------------------------------------------------- Progress Note Details Patient Name: Date of Service: [name]. 12/05/2020 10:45 A M Medical Record Number: [id number] Patient Account Number: [id number] Date of Birth/Sex: Treating RN: January 28, 1951 (70 y.o. [name] Primary Care Provider: [name] Other Clinician: Referring Provider: Treating Provider/Extender: Darlen Round in Treatment: 174 Subjective Chief Complaint Information obtained from Patient Bilateral reoccurring LE ulcers History of Present Illness (HPI) this patient has been seen a couple  of times before and returns with recurrent problems to her right and left lower extremity with swelling and weeping ulcerations due to not wearing her compression stockings which she had been advised to do during her last discharge, at the end of June 2018. During her last visit the patient had had normal arterial blood flow and her venous reflux study did not necessitate any surgical intervention. She was recommended compression and elevation and wound care. After prolonged treatment the patient was completely healed but she has been noncompliant with wearing or compressions.. She was here last week with an outpatient return visit planned but the patient came in a very poor  general condition with altered mental status and was rushed to the ER on my request. With a history of hypertension, diabetes, TIA and right-sided weakness she was set up for an MRI on her brain and cervical spine and was sent to [facility]. Getting an MRI done was very difficult but once the workup was done she was found not to have any spinal stenosis, epidural abscess or hematoma or discitis. This was radiculopathy to be treated as an outpatient and she was given a follow-up appointment. Today she is feeling much better alert and oriented and has come to reevaluate her bilateral lower extremity lymphedema and ulceration 03/25/2017 -- she was admitted to the hospital on 03/16/2017 and discharged on 03/18/2017 with left leg cellulitis and ulceration. She was started on vancomycin and Zosyn and x-ray showed no bony involvement. She was treated for a cellulitis with IV antibiotics changed to Rocephin and Flagyl and was discharged on oral Keflex and doxycycline to complete a 7 day course. Last hemoglobin A1c was 7.1 and her other ailments including hypertension got asthma were appropriately treated. 05/06/2017 -- she is awaiting the right size of compression stockings from [facility] but other than that has been doing well. ====== Old notes 70 year old patient was seen one time last October and was lost to follow-up. She has recurrent problems with weeping and ulceration of her left lower extremity and has swelling of this for several years. It has been worse for the last 2 months. Past medical history is significant for diabetes mellitus type 2, hypertension, gout, morbid obesity, depressive disorders, hiatal hernia, migraines, status post knee surgery, risk of a cholecystectomy, vaginal hysterectomy and breast biopsy. She is not a smoker. As noted before she has never had a venous duplex study and an arterial ABI study was attempted but the left lower extremity was noncompressible 10/01/2016 -- had a  lower extremity venous duplex reflux evaluation which showed no evidence of deep vein reflux in the right or left lower extremity, and no evidence of great saphenous vein reflux more than 500 ms in the right or left lower extremity, and the left small saphenous vein is incompetent but no vascular consult was recommended. review of her electronic medical records noted that the ABI was checked in July 2017 where the right ABI was normal limits and the left ABI could not be ascertained due to pain with cuff pressure but the waveforms are within normal limits. her arterial duplex study scheduled for April 27. 10/08/2016 -- the patient has various reasons for not having a compression on and for the last 3 days she has had no compression on her left lower extremity either due to pain or the lack of nursing help. She does not use her juxta lites either. 10/15/2016 -- the patient did not keep her appointment for arterial duplex study on April  27 and I have asked her to reschedule this. Her pain is out of proportion with the physical findings and she continuously fails to wear a compression wraps and cuts them off because she says she cannot tolerate the pain. She does not use her juxta lites either. 10/22/2016 -- he has rescheduled her arterial duplex study to May 21 and her pain today is a bit better. She has not been wearing her juxta lites on her right lower extremity but now understands that she needs to do this. She did tolerate the to press compression wrap on her left lower extremity 10/29/2016 --arterial duplex study is scheduled for next week and overall she has been tolerating her compression wraps and also using her juxta lites on her right lower extremity 11/05/2016 -- the right ABI was 0.95 the left was 1.03. The digit TBI is on the right was 0.83 on the left was 0.92 and she had biphasic flow through these vessels. The impression was that of normal lower extremity arterial study. 11/12/2016 --  her pain is minimal and she is doing very well overall. 11/26/2016 -- she has got juxta lites and her insurance will not pay for additional dual layer compression stockings. She is going to order some from [facility]. 05/12/2017 -- her juxta lites are very old and too big for her and these have not been helping with compression. She did get 20-30 mm compression stockings from [facility] but she and her husband are unable to put these on. I believe she will benefit from bilateral Extremit-ease, compression stockings and we will measure her for these today. 05/20/2017 -- lymphedema on the left lower extremity has increased a lot and she has a open ulceration as a result of this. The right lower extremity is looking pretty good. She has decided to by the compression stockings herself and will get reimbursed by the home health, at a later date. 05/27/2017 -- her sciatica is bothering her a lot and she thought her left leg pain was caused due to the compression wrap and hence removed it and has significant lymphedema. There is no inflammation on this left lower extremity. 06/17/17 on evaluation today patient appears to be doing very well and in fact is completely healed in regard to her ulcerations. Unfortunately however she does have continued issues with lymphedema nonetheless. We did order compression garments for her unfortunately she states that the size that she received were large although we ordered medium. Obviously this means she is not getting the optimal compression. She does not have those with her today and therefore we could not confirm and contact the company on her behalf. Nonetheless she does state that she is going to have her husband bring them by tomorrow so that we can verify and then get in touch with the company. No fevers, chills, nausea, or vomiting noted at this time. Overall patient is doing better otherwise and I'm pleased with the progress she has made. 07/01/17 on evaluation today  patient appears to be doing very well in regard to her bilateral lower extremity she does not have any openings at this point which is excellent news. Overall I'm pleased with how things have progressed up to this time. Since she is doing so well we did order her compression which we are seeing her today to ensure that it fits her properly and everything is doing well in that regard and then subsequently she will be discharged. ============ Old Notes: 03/31/16 patient presents today for evaluation concerning open  wounds that she has over the left medial ankle region as well as the left dorsal foot. She has previously had this occur although it has been healed for a number of months after having this for about a year prior until her hospitalization on 01/05/16. At that point in time it appears that she was admitted to the hospital for left lower extremity cellulitis and was placed on vancomycin and Zosyn at that point. Eventually upon discharge on January 15, 2016 she was placed on doxycycline at that point in time. Later on 03/27/16 positive wound culture growing Escherichia coli this was switched to amoxicillin. Currently she tells me that she is having pain radiated to be a 7 out of 10 which can be as high as 10 out of 10 with palpation and manipulation of the wound. This wound appears to be mainly venous in nature due to the bilateral lower extremity venous stasis/lymphedema. This is definitely much worse on her left than the right side. She does have type 1 diabetes mellitus, hypertension, morbid obesity, and is wheelchair dependent.during the course of the hospital stay a blood culture was also obtained and fortunately appeared negative. She also had an x-ray of the tibia/fibula on the left which showed no acute bone abnormality. Her white blood cell count which was performed last on 03/25/16 was 7.3, hemoglobin 12.8, protein 7.1, albumin 3.0. Her urine culture appeared to be negative for any  specific organisms. Patient did have a left lower extremity venous duplex evaluation for DVT . This did not include venous reflux studies but fortunately was negative for DVT Patient also had arterial studies performed which revealed that she had a . normal ABI on the right though this was unable to be performed on the left secondary to pain that she was having around the ankle region due to the wound. However it was stated on report that she had biphasic pulses and apparently good blood flow. ========== 06/03/17 she is here in follow-up evaluation for right lower extremity ulcer. The right lower sure he has healed but she has reopened to the left medial malleolus and dorsal foot with weeping. She is waiting for new compression garments to arrive from home health, the previous compression garments were ill fitting. We will continue with compression bilaterally and follow-up in 2 weeks Readmission: 08/05/17 on evaluation today patient appears to be doing somewhat poorly in regard to her left lower extremity especially although the right lower extremity has a small area which may no longer be open. She has been having a lot of drainage from the left lower extremity however he tells me that she has not been able to use the EXTREMIT-EASE Compression at this point. She states that she did better and was able to actually apply the Juxta-Lite compression although the wound that she has is too large and therefore really does not compress which is why she cannot wear it at this point. She has no one who can help her put it on regular basis her son can sometimes but he's not able to do it most of the time. I do believe that's why she has begun to weave and have issues as she is currently yet again. No fevers, chills, nausea, or vomiting noted at this time. Patient is no evidence of dementia. 08/12/17 on evaluation today patient appears to still be doing fairly well in regard to the draining areas/weeping areas  at this point. With that being said she unfortunately did go to the ER yesterday  due to what was felt to be possibly a cellulitis. They place her on doxycycline by mouth and discharge her home. She definitely was not admitted. With that being said she states she has had more discomfort which has been unusual for her even compared to prior times and she's had infections.08/12/17 on evaluation today patient appears to still be doing fairly well in regard to the draining areas/weeping areas at this point. With that being said she unfortunately did go to the ER yesterday due to what was felt to be possibly a cellulitis. They place her on doxycycline by mouth and discharge her home. She definitely was not admitted. With that being said she states she has had more discomfort which has been unusual for her even compared to prior times and she's had infections. 08/19/17 put evaluation today patient tells me that she's been having a lot of what sounds to be neuropathic type pain in regard to her left lower extremity. She has been using over-the-counter topical bins again which some believe. That in order to apply the she actually remove the wrap we put on her last Wednesday on Thursday. Subsequently she has not had anything on compression wise since that time. The good news is a lot of the weeping areas appear to have closed at this point again I believe she would do better with compression but we are struggling to get her to actually use what she needs to at this point. No fevers, chills, nausea, or vomiting noted at this time. 09/03/17 on evaluation today patient appears to be doing okay in regard to her lower extremities in regard to the lymphedema and weeping. Fortunately she does not seem to show any signs of infection at this point she does have a little bit of weeping occurring in the right medial malleolus area. With that being said this does not appear to be too significant which is good news. 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 She has severe skin damage secondary to both of these features involving the dorsal left foot and medial left ankle and lower leg. Still has open areas in the left anterior foot. The area on the right closed over. She uses her own juxta light stockings. She does not have an arterial issue 09/16/17 on evaluation today patient actually appears to be doing excellent in regard to her bilateral lower extremity swelling. The Juxta-Lite compression wrap seem to be doing very well for her. She has not however been using the portion that goes over her foot. Her left foot still is draining a little bit not nearly as significant as it has been in the past but still I do believe that she likely needs to utilize the full wrap including the foot portion of this will improve as well. She also has been apparently putting on a significant amount of Vaseline which also think is not helpful for her. I recommended that if she feels she needs something for moisturizer Eucerin will probably be better. 09/30/17 on evaluation today patient presents with several new open areas in regard to her left lower extremity although these appear to be minimal and mainly seem to be more moisture breakdown than anything. Fortunately she does not seem to have any evidence of infection which is great news. She has been tolerating the dressing changes without complication we are using silver alginate on the foot she has been using AB pads to have the legs and using her  Juxta- Lite compression which seems to be controlling her swelling very well. Overall I'm pleased with the poor way she has progressed. 10/14/17 on evaluation today patient appears to be doing better in regard to her left lower extremity areas of weeping. She does still have some discomfort although in general this does not appear to be as macerated and I think it is progressing  nicely. I do think she still needs to wear the foot portion of her Juxta- Lite in order to get the most benefit from the wrap obviously. She states she understands. Fortunately there does not appear to be evidence of infection at this time which is great news. 10/28/17 on evaluation today patient appears to be doing excellent in regard to her left lower extremity. She has just a couple areas that are still open and seem to be causing any trouble whatsoever. For that reason I think that she is definitely headed in the right direction the spots are very tiny compared to what we have been dealing with in the past. 11/11/17 on evaluation today patient appears to have a right lateral lower extremity ulcer that has opened since I last saw her. She states this is where the home health nurse that was coming out remove the dressing without wetting the alginate first. Nonetheless I do not know if this is indeed the case or not but more importantly we have not ordered home help to be coming out for her wounds at all. I'm unsure as to why they are coming out and we're gonna have to check on this and get things situated in that regard. With that being said we currently really do not need them to be coming out as the patient has been taking care of her leg herself without complication and no issues. In fact she was doing much better prior to nursing coming out. 11/25/17 on evaluation today patient actually appears to be doing fairly well in regard to her left lower extremity swelling. In fact she has very little area of weeping at this point there's just a small spot on the lateral portion of her right leg that still has me just a little bit more concerned as far as wanting to see this clear up before I discharge her to caring for this at home. Nonetheless overall she has made excellent progress. 12/09/17 on evaluation today patient appears to be doing rather well in regard to her lower extremity edema. She does have  some weeping still in the left lower extremity although the big area we were taking care of two weeks ago actually has closed and she has another area of weeping on the left lower extremity immediately as well is the top of her foot. She does not currently have lymphedema pumps she has been wearing her compression daily on a regular basis as directed. With that being said I think she may benefit from lymphedema pumps. 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 We had a very difficult time getting and keeping this under control. 12/23/17 on evaluation today patient actually appears to be doing a little bit more poorly in regard to her bilateral lower extremities. She has been tolerating the Juxta-Lite compression wraps. Unfortunately she has two new ulcers on the right lower extremity and left lower Trinity ulceration seems to be larger. Obviously this is not good  news. She has been tolerating the dressings without complication. 12/30/17 on evaluation today patient actually appears to be doing much better in regard to her bilateral lower extremity edema. She continues to have some issues with ulcerations and in fact there appears to be one spot on each leg where the wrap may have caused a little bit of a blister which is subsequently opened up at this point is given her pain. Fortunately it does not appear to be any evidence of infection which is good news. No fevers chills noted. 01/13/18 on evaluation today patient appears to be doing rather well in regard to her bilateral lower extremities. The dressings did get kind of stuck as far as the wound beds are concerned but again I think this is mainly due to the fact that she actually seems to be showing signs of healing which is good news. She's not having as much drainage therefore she was having more of  the dressing sticking. Nonetheless overall I feel like her swelling is dramatically down compared to previous. 01/20/18 on evaluation today patient unfortunately though she's doing better in most regards has a large blister on the left anterior lower extremity where she is draining quite significantly. Subsequently this is going to need debridement today in order to see what's underneath and ensure she does not continue to trapping fluid at this location. Nonetheless No fevers, chills, nausea, or vomiting noted at this time. 01/27/18 on evaluation today patient appears to be doing rather well at this point in regard to her right lower extremity there's just a very small area that she still has open at this point. With that being said I do believe that she is tolerating the compression wraps very well in making good progress. Home health is coming out at this point to see her. Her left lower extremity on the lateral portion is actually what still mainly open and causing her some discomfort for the most part 02/10/18 on evaluation today patient actually appears to be doing very well in regard to her right lower extremity were all the ulcers appear to be completely close. In regard to the left lower extremity she does have two areas still open and some leaking from the dorsal surface of her foot but this still seems to be doing much better to me in general. 02/24/18 on evaluation today patient actually appears to be doing much better in regard to her right lower extremity this is still completely healed. Her left lower extremity is also doing much better fortunately she has no evidence of infection. The one area that is gonna require some debridement is still on the left anterior shin. Fortunately this is not hurting her as badly today. 03/10/18 on evaluation today patient appears to be doing better in some regards although she has a little bit more open area on the dorsal foot and she also has some issues on  the medial portion of the left lower extremity which is actually new and somewhat deep. With that being said there fortunately does not appear to be any significant signs of infection which is good news. No fevers, chills, nausea, or vomiting noted at this time. In general her swelling seems to be doing fairly well which is good news. 03/31/18 on evaluation today patient presents for follow-up concerning her left lower extremity lymphedema. Unfortunately she has been doing a little bit more poorly since I last saw her in regard to the amount of weeping that she is experiencing. She's also having  some increased pain in the anterior shin location. Unfortunately I do not feel like the patient is making such good progress at this point a few weeks back she was definitely doing much better. 04/07/18 on evaluation today patient actually appears to be showing some signs of improvement as far as the left lower extremity is concerned. She has been tolerating the dressing changes and it does appear that the Drawtex did better for her. With that being said unfortunately home health is stating that they cannot obtain the Drawtex going forward. Nonetheless we're gonna have to check and see what they may be able to get the alginate they were using was getting stuck in causing new areas of skin being pulled all that with and subsequently weep and calls her to worsen overall this is the first time we've seen improvement at this time. 04/14/18 on evaluation today patient actually appears to be doing rather well at this point there does not appear to be any evidence of infection at this time and she is actually doing excellent in regard to the weeping in fact she almost has no openings remaining even compared to just last week this is a dramatic improvement. No fevers chills noted 04/21/18 evaluation today patient actually appears to be doing very well. She in fact is has a small area on the posterior lower extremity  location and she has a small area on the dorsal surface of her foot that are still open both of which are very close to closing. We're hoping this will be close shortly. She brought her Juxta-Lite wrap with her today hoping that would be able to put her in it unfortunately I don't think were quite at that point yet but we're getting closer. 04/28/18 upon evaluation today patient actually appears to be doing excellent in regard to her left lower extremity ulcer. In fact the region on the posterior lower extremity actually is much smaller than previously noted. Overall I'm very happy with the progress she has made. She again did bring her Juxta-Lite although we're not quite ready for that yet. 05/11/18 upon evaluation today patient actually appears to be doing in general fairly well in regard to her left lower Trinity. The swelling is very well controlled. With that being said she has a new area on the left anterior lower extremity as well as between the first and second toes of her left foot that was not present during the last evaluation. The region of her posterior left lower extremity actually appears to be almost completely healed. T be honest I'm very pleased with o the way that stands. Nonetheless I do believe that the lotion may be keeping the area to moist as far as her legs are concerned subsequently I'm gonna consider discontinuing that today. 05/26/18 on evaluation today patient appears to be doing rather well in regard to her left lower should be ulcers. In fact everything appears to be close except for a very small area on the left posterior lower extremity. Fortunately there does not appear to be any evidence of infection at this time. Overall very pleased with her progress. 06/02/18 and evaluation today patient actually appears to be doing very well in regard to her lower extremity ulcers. She has one small area that still continues to weep that I think may benefit her being able to  justify lotion and user Juxta-Lite wraps versus continued to wrap her. Nonetheless I think this is something we can definitely look into at this point. 06/23/18 on  evaluation today patient unfortunately has openings of her bilateral lower extremities. In general she seems to be doing much worse than when I last saw her just as far as her overall health standpoint is concerned. She states that her discomfort is mainly due to neuropathy she's not having any other issues otherwise. No fevers, chills, nausea, or vomiting noted at this time. 06/30/18 on evaluation today patient actually appears to be doing a little worse in regard to her right lower extremity her left lower extremity of doing fairly well. Fortunately there is no sign of infection at this time. She has been tolerating the dressing changes without complication. Home health did not come out like they were supposed to for the appropriate wrap changes. They stated that they never received the orders from [place] which were fax. Nonetheless we will send a copy of the orders with the patient today as well. 07/07/18 on evaluation today patient appears to be doing much better in regard to lower extremities. She still has several openings bilaterally although since I last saw her her legs did show obvious signs of infection when she later saw her nurse. Subsequently a culture was obtained and she is been placed on Bactrim and Keflex. Fortunately things seem to be looking much better it does appear she likely had an infection. Again last week we'd even discussed it but again there really was not any obvious sign that she had infection therefore we held off on the antibiotics. Nonetheless I'm glad she's doing better today. 07/14/18 on evaluation today patient appears to be doing much better regarding her bilateral lower Trinity's. In fact on the right lower for me there's nothing open at this point there are some dry skin areas at the sites where she had  infection. Fortunately there is no evidence of systemic infection which is excellent news. No fevers chills noted 07/21/18 on evaluation today patient actually appears to be doing much better in regard to her left lower extremity ulcers. She is making good progress and overall I feel like she's improving each time I see her. She's having no pain I do feel like the infection is completely resolved which is excellent news. No fevers, chills, nausea, or vomiting noted at this time. 07/28/18 on evaluation today patient appears to be doing very well in regard to her left lower [employer]. Everything seems to be showing signs of improvement which is excellent news. Overall very pleased with the progress that has been made. Fortunately there's no evidence of active infection at this time also excellent news. 08/04/18 on evaluation today patient appears to be doing more poorly in regard to her bilateral lower extremities. She has two new areas open up on the right and these were completely closed as of last week. She still has the two spots on the left which in my pinion seem to be doing better. Fortunately there's no evidence of infection again at this point. 08/11/18 on evaluation today patient actually appears to be doing very well in regard to her bilateral lower Trinity wounds that all seem to be doing better and are measures smaller today. Fortunately there's no signs of infection. No fevers, chills, nausea, or vomiting noted at this time. 08/18/18 on evaluation today patient actually appears to be doing about the same inverter bilateral lower extremities. She continues to have areas that blistering open as was drain that fortunately nothing too significant. Overall I feel like Drawtex may have done better for her however compared to the collagen.  08/25/18 on evaluation today patient appears to be doing a little bit more poorly today even compared to last time I saw her. Again I'm not exactly sure  why she's making worse progress over the past several weeks. I'm beginning to wonder if there is some kind of underlying low level infection causing this issue. I did actually take a culture from the left anterior lower extremity but it was a new wound draining quite a bit at this point. Unfortunately she also seems to be having more pain which is what also makes me worried about the possibility of infection. This is despite never erythema noted at this point. 09/01/18 on evaluation today patient actually appears to be doing a little worse even compared to last week in regard to bilateral lower extremities. She did go to the hospital on the 16th was given a dose of IV Zosyn and then discharged with a recommendation to continue with the Bactrim that I previously prescribed for her. Nonetheless she is still having a lot of discomfort she tells me as well at this time. This is definitely unfortunate. No fevers, chills, nausea, or vomiting noted at this time. 09/08/18 on evaluation today patient's bilateral lower extremities actually appear to be shown signs of improvement which is good news. Fortunately there does not appear to be any signs of active infection I think the anabiotic is helping in this regard. Overall I'm very pleased with how she is progressing. 09/15/18 patient was actually seen in ER yesterday due to her legs as well unfortunately. She states she's been having a lot of pain and discomfort as well as a lot of drainage. Upon inspection today the patient does have a lot of swelling and drainage I feel like this is more related to lymphedema and poor fluid control than it is to infection based on what I'm seeing. The physician in the emergency department also doubted that the patient was having a significant infection nonetheless I see no evidence of infection obvious at this point although I do see evidence of poor fluid control. She still not using a compression pumps, she is not elevating due  to her lift chair as well as her hospital bed being broken, and she really is not keeping her legs up as much as they should be and also has been taking off her wraps. All this combined I think has led to poor fluid control and to be honest she may be somewhat volume overloaded in general as well. I recommend that she may need to contact your physician to see if a prescription for a diuretic would be beneficial in their opinion. As long as this is safe I think it would likely help her. 09/29/18 on evaluation today patient's left lower extremity actually appears to be doing quite a bit better. At least compared to last time that I saw her. She still has a large area where she is draining from but there's a lot of new skin speckled trout and in fact there's more new skin that there are open areas of weeping and drainage at this point. This is good news. With regard to the right lower extremity this is doing much better with the only open area that I really see being a dry spot on the right lateral ankle currently. Fortunately there's no signs of active infection at this time which is good news. No fevers, chills, nausea, or vomiting noted at this time. The patient seems somewhat stressed and overwhelmed during the visit today  she was very lethargic as such. She does and she is not taking any pain medications at this point. Apparently according to her husband are also in the process of moving which is probably taking its toll on her as well. 10/06/18 on evaluation today patient appears to be doing rather well in regard to her lower extremities compared to last evaluation. Fortunately there's no signs of active infection. She tells me she did have an appointment with her primary. Nonetheless he was concerned that the wounds were somewhat deep based on pictures but we never actually saw her legs. She states that he had her somewhat worried due to the fact that she was fearing now that she was [place] have  to have an amputation. With that being said based on what I'm seeing check she looks better this week that she has the last two times I've seen her with much less drainage I'm actually pleased in this regard. That doesn't mean that she's out of the water but again I do not think what the point of talking about education at all in regard to her leg. She is very happy to hear this. She is also not having as much pain as she was having last week. 10/13/18 unfortunately on evaluation today patient still continues to have a significant amount of drainage she's not letting home health actually apply the compression dressings at this point. She's trying to use of Juxta-Lite of the top of Kerlex and the second layer of the three layer compression wrap. With that being said she just does not seem to be making as good a progress as I would expect if she was having the compression applied and in place on a regular basis. No fevers, chills, nausea, or vomiting noted at this time. 10/20/18 on evaluation today patient appears to be doing a little better in regard to her bilateral lower extremity ulcers. In fact the right lower extremity seems to be healed she doesn't even have any openings at this point left lower extremity though still somewhat macerated seems to be showing signs of new skin growth at multiple locations throughout. Fortunately there's no evidence of active infection at this time. No fevers, chills, nausea, or vomiting noted at this time. 10/27/18 on evaluation today patient appears to be doing much better in regard to her left lower Trinity ulcer. She's been tolerating the laptop complication and has minimal drainage noted at this point. Fortunately there's no signs of active infection at this time. No fevers, chills, nausea, or vomiting noted at this time. 11/03/18 on evaluation today patient actually appears to be doing excellent in regard to her left lower extremity. She is having very little  drainage at this point there does not appear to be any significant signs of infection overall very pleased with how things have gone. She is likewise extremely pleased still and seems to be making wonderful progress week to week. I do believe antibiotics were helpful for her. Her primary care provider did place on amateur clean since I last saw her. 11/17/18 on evaluation today patient appears to be doing worse in regard to her bilateral lower extremities at this point. She is been tolerating the dressing changes without complication. With that being said she typically takes the Coban off fairly quickly upon arriving home even after being seen here in the clinic and does not allow home health reapply command as part of the dressing at home. Therefore she said no compression essentially since I last saw her  as best I can tell. With that being said I think it shows and how much swelling she has in the open wounds that are noted at this point. Fortunately there's no signs of infection but unfortunately if she doesn't get this under control I think she will end up with infection and more significant issues. 11/24/18 on evaluation today patient actually appears to be doing somewhat better in regard to her bilateral lower extremities. 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 Nonetheless that is doing better currently so she should be able to attempt reinitiating the lymphedema pumps at this time. No fevers, chills, nausea, or vomiting noted at this time. 12/01/18 upon evaluation today patient's left lower extremity appears to be doing quite well unfortunately her right lower extremity is not doing nearly as well. She has been tolerating the dressing changes without complication unfortunately she did not keep a wrap on the right at this time. Nonetheless I believe  this has led to increased swelling and weeping in the world is actually much larger than during the last evaluation with her. 12/08/18 on evaluation today patient appears to be doing about the same at this point in regard to her right lower extremity. There is some more palatable to touch I'm concerned about the possibility of there being some infection although I think the main issue is she's not keeping her compression wrap on which in turn is not allowing this area to heal appropriately. 12/22/18 on evaluation today patient appears to be doing better in regard to left lower extremity unfortunately significantly worse in regard to the right lower extremity. The areas of blistering and necrotic superficial tissue have spread and again this does not really appear to be signs of infection and all she just doesn't seem to be doing nearly as well is what she has been in the past. Overall I feel like the Augmentin did absolutely nothing for her she doesn't seem to have any infection again I really didn't think so last time either is more of a potential preventative measure and hoping that this would make some difference but I think the main issue is she's not wearing her compression. She tells me she cannot wear the [place] been we put on she takes it off pretty much upon getting home. Subsequently she worshiped Juxta-Lite when I questioned her about how often she wears it this is no more than three hours a day obviously that leaves 21 hours that she has no compression and this is obviously not doing well for her. Overall I'm concerned that if things continue to worsen she is at great risk of both infection as well as losing her leg. 01/05/19 on evaluation today patient appears to be doing well in regard to her left lower extremity which he is allowing [place] to wrap and not so well with regard to her right lower extremity which she is not allowing [place] to really wrap and keep the wrap on. She states that it hurts  too badly whenever it's wrapped and she ends up having to take it off. She's been using the Juxta-Lite she tells me up to six hours a day although I question whether or not that's really been the case to be honest. Previously she told me three hours today nonetheless obviously the legs as long as  the wrap is doing great when she is not is doing much more poorly. 01/12/2019 on evaluation today patient actually appears to be doing a little better in my opinion with regard to her right lower extremity ulcer. She has a small open area on the left lower extremity unfortunately but again this I think is part of the normal fluctuation of what she is going to have to expect with regard to her legs especially when she is not using her lymphedema pumps on a regular basis. Subsequently based on what I am seeing today I think that she does seem to be doing slightly better with regard to her right lower extremity she did see her primary care provider on Monday they felt she had an infection and placed her on 2 antibiotics. Both Cipro and clindamycin. Subsequently again she seems possibly to be doing a little bit better in regards to the right lower extremity she also tells me however she has been wearing the compression wrap over the past week since I spoke with her as well that is a Kerlix and Coban wrap on the right. No fevers, chills, nausea, vomiting, or diarrhea. 01/19/2019 on evaluation today patient appears to be doing better with regard to her bilateral lower extremities especially the right. I feel like the compression has been beneficial for her which is great news. She did get a call from her primary care provider on her way here today telling her that she did have methicillin- resistant Staphylococcus aureus and he was calling in a couple new antibiotics for her including a ointment to be applied she tells me 3 times a day. With that being said this sounds like likely to be Bactroban which I think could be  applied with each dressing/wrap change but I would not be able to accommodate her applying this 3 times a day. She is in agreement with the least doing this we will add that to her orders today. 01/26/2019 on evaluation today patient actually appears to be doing much better with regard to her right lower extremity. Her left lower extremity is also doing quite well all things considering. Fortunately there is no evidence of active infection at this time. No fevers, chills, nausea, vomiting, or diarrhea. 02/02/2019 on evaluation today patient appears to be doing much better compared to her last evaluation. Little by little off like her right leg is returning more towards normal. There does not appear to be any signs of active infection and overall she seems to be doing quite well which is great news. I am very pleased in this regard. No fevers, chills, nausea, vomiting, or diarrhea. 02/09/2019 upon evaluation today patient appears to be doing better with regard to her bilateral lower extremities. She has been tolerating the dressing changes without complication. Fortunately there is no signs of active infection at this time. No fevers, chills, nausea, vomiting, or diarrhea. 02/23/2019 on evaluation today patient actually appears to be doing quite well with regard to her bilateral lower extremities. She has been tolerating the dressing changes without complication. She is even used her pumps one time and states that she really felt like it felt good. With that being said she seems to be in good spirits and her legs appear to be doing excellent. 03/09/2019 on evaluation today patient appears to be doing well with regard to her right lower extremity there are no open wounds at this time she is having some discomfort but I feel like this is more neuropathy than anything.  With regard to her left lower extremity she had several areas scattered around that she does have some weeping and drainage from but again  overall she does not appear to be having any significant issues and no evidence of infection at this time which is good news. 03/23/2019 on evaluation today patient appears to be doing well with regard to her right lower extremity which she tells me is still close she is using her juxta light here. Her left lower extremity she mainly just has an area on the foot which is still slightly draining although this also is doing great. Overall very pleased at this time. 04/06/2019 patient appears to be doing a little bit worse in regard to her left lower extremity upon evaluation today. She feels like this could be becoming infected again which she had issues with previous. Fortunately there is no signs of systemic infection but again this is always a struggle with her with her legs she will go from doing well to not so well in a very short amount of time. 04/20/2019 on evaluation today patient actually appears to be doing quite well with regard to her right lower extremity I do not see any signs of active infection at this time. Fortunately there is no fever chills noted. She is still taking the antibiotics which I prescribed for her at this point. In regard to the left lower extremity I do feel like some of these areas are better although again she still is having weeping from several locations at this time. 04/27/2019 on evaluation today patient appears to be doing about the same if not slightly worse in regard to her left lower extremity ulcers. She tells me when questioned that she has been sleeping in her Hoveround chair in fact she tells me she falls asleep without even knowing it. I think she is spending a whole lot of time in the chair and less time walking and moving around which is not good for her legs either. On top of that she is in a seated position which is also the worst position she is not really elevating her legs and she is also not using her lymphedema pumps. All this is good to contribute  to worsening of her condition in general. 05/18/2019 on evaluation today patient appears to be doing well with regard to her lower extremity on the right in fact this is showing no signs of any open wounds at this time. On the left she is continuing to have issues with areas that do drain. Some of the regions have healed and there are couple areas that have reopened. She did go to the ER per the patient according to recommendations from the home health nurse due to what she was seen when she came out on 05/13/2019. Subsequently she felt like the patient needed to go to the hospital due to the fact that again she was having "milky white discharge" from her leg. Nonetheless she had and then was placed on doxycycline and subsequently seems to be doing better. 06/01/2019 upon evaluation today patient appears to be doing really in my opinion about the same. I do not see any signs of active infection which is good news. Overall she still has wounds over the bilateral lower extremities she has reopened on the right but this appears to be more of a crack where there is weeping/edema coming from the region. I do not see any evidence of infection at either site based on what I visualized today. 07/13/2019  upon evaluation today patient appears to be doing a little worse compared to last time I saw her. She since has been in the hospital from 06/21/2019 through 06/29/2019. This was secondary to having Covid. During that time they did apply lotion to her legs which unfortunately has caused her to develop a myriad of open wounds on her lower extremities. Her legs do appear to be doing better as far as the overall appearance is concerned but nonetheless she does have more open and weeping areas. 07/27/2019 upon evaluation today patient appears to be doing more poorly to be honest in regard to her left lower extremity in particular. There is no signs of systemic infection although I do believe she may have local  infection. She notes she has been having a lot of blue/green drainage which is consistent potentially with Pseudomonas. That may be something that we need to consider here as well. The doxycycline does not seem to have been helping. 08/03/2019 upon evaluation today patient appears to be doing a little better in my opinion compared to last week's evaluation. Her culture I did review today and she is on appropriate medications to help treat the Enterobacter that was noted. Overall I feel like that is good news. With that being said she is unfortunately continuing to have a lot of drainage and though it is doing better I still think she has a long ways to go to get things dried up in general. Fortunately there is no signs of systemic infection. 08/10/2019 upon evaluation today patient appears to be doing may be slightly better in regard to her left lower extremity the right lower extremity is doing much better. Fortunately there is no signs of infection right now which is good news. No fevers, chills, nausea, vomiting, or diarrhea. 08/24/2019 on evaluation today patient appears to be doing slightly better in regard to her lower extremities. The left lower extremity seems to be healed the right lower extremity is doing better though not completely healed as far as the openings are concerned. She has some generalized issues here with edema and weeping secondary to her lymphedema though again I do believe this is little bit drier compared to prior weeks evaluations. In general I am very pleased with how things seem to be progressing. No fevers, chills, nausea, vomiting, or diarrhea. 08/31/2019 upon evaluation today patient actually seems to making some progress here with regard to the left lower extremity in particular. She has been tolerating the dressing changes without complication. Fortunately there is no signs of active infection at this time. No fevers, chills, nausea, vomiting, or diarrhea. She did see  Dr. Doren Custard and he did note that she did have a issue with the left great saphenous vein and the small saphenous vein in the leg. With that being said he was concerned about the possibility of laser ablation not being extremely successful. He also mentioned a small risk of DVT associated with the procedure. However if the wounds do not continue to improve he stated that that would probably be the way to go. Fortunately the patient's legs do seem to be doing much better. 09/07/2019 upon evaluation today patient appears to be doing better with regard to her lower extremities. She has been tolerating the dressing changes without complication. With that being said she is showing signs of improvement and overall very pleased. There are some areas on her leg that I think we do need to debride we discussed this last week the patient is in agreement  with doing that as long as it does not hurt too badly. 09/14/2019 upon evaluation today patient appears to be doing decently well with regard to her left lower extremity. She is not having near as much weeping as she has had in the past things seem to be drying up which is good news. There is no signs of active infection at this time. 09/21/19 upon evaluation today patient appears to be doing better in regard overall to her bilateral lower extremities. She again has less open than she did previous and each week I feel like this is getting better. Fortunately there is no signs of active infection at this time. No fevers, chills, nausea, vomiting, or diarrhea. 09/28/2019 upon evaluation today patient actually appears to be showing signs of improvement with regard to her left lower extremity. Unfortunately the right medial lower extremity around the ankle region has reopened to some degree but this appears to be minimal still which is good news. There is no signs of active infection at this time which is also good news. 10/12/2019 upon evaluation today patient appears to be  doing okay with regard to her bilateral lower extremities today. The right is a little bit worse then last evaluation 2 weeks ago. The left is actually doing a little better in my opinion. Overall there is no signs of active infection at this time that I see. Obviously that something we have to keep a close eye on she is very prone to this with the significant and multiple openings that she has over the bilateral lower extremities. 10/19/2019 upon evaluation today patient appears to be doing about the best that I have seen her in quite some time. She has been tolerating the dressing changes without complication. There does not appear to be any signs of active infection and overall I am extremely happy with the way her legs appeared. She is drying up quite nicely and overall is having less pain. 11/09/2019 upon evaluation today patient appears to be doing better in regard to her wounds. She seems to be drying up more and more each time I see her this is just taking a very long time. Fortunately there is no signs of active infection at this time. 11/23/2019 upon evaluation today patient actually appears to be doing excellent in regard to her lower extremities at this point compared to where she has been. Fortunately there is no signs of active infection at this time. She did go to the hospital last week for nausea and vomiting completely unrelated to her wounds. Fortunately she is doing better she was given some Reglan and got better. She had associated abdominal pain but they never found out what was going on. 12/07/2019 upon evaluation today patient appears to be doing well for the most part in regard to her legs. She unfortunately has not been keeping the [place] portion of her wraps on therefore the compression has not really been sufficient for what it is supposed to be. Nonetheless she tells me that it just hurt too bad therefore she removed it. 12/21/2019 upon evaluation today patient actually appears to  be doing quite well with regard to her legs. I do feel like she has been making progress which is great news and overall there is no signs of active infection at this time. No fevers, chills, nausea, vomiting, or diarrhea. 01/04/2020 upon evaluation today patient presents for follow-up concerning her lower extremity edema bilaterally. She still has open wounds she has not been using her lymphedema  pumps. She is also not been utilizing her compression wraps appropriately she tends to unwrap them, take them off, or states that they hurt. Obviously the reason they hurt is because her legs start to swell but the issue is if she would use her compression/lymphedema pumps regularly she would not swell and she would have the pain. Nonetheless she has not even picked them up once honestly over the past several months and may be even as much as in the past year based on my opinion and what have seen. She tells me today that after last week when I talked about this with her specifically actually that was 2 weeks ago that she "forgot". 8//21 on evaluation today patient appears to be doing a little better in regard to her legs bilaterally. Fortunately there is no signs of active infection at this time. She tells me that she used her lymphedema pumps all of one time over the past 2 weeks since I last saw her. She tells me that she has been too busy in order to continue to use these. 02/01/2020 on evaluation today patient appears to be doing some better in regard to her wounds in general in her legs. We felt the right was healed although is not completely it does appear to be doing better she tells me she has been using her lymphedema pumps that she has had this six times since I last saw her. Obviously the more she does that the better she would do my opinion 02/15/2020 upon evaluation today patient appears to be doing about the same in regard to her legs. She tells me that she is pumping I'm still not sure how  much she does to be perfectly honest. However even if she does a little bit here and there I guess that is better than nothing. Fortunately there is no sign of active infection at this time which is great news. No fevers, chills, nausea, vomiting, or diarrhea. 02/29/2020 on evaluation today patient actually appears to be doing quite well all things considered this week. She has been tolerating the dressing changes without complication. Fortunately there is no signs of active infection at this time. No fevers, chills, nausea, vomiting, or diarrhea. 03/14/2020 upon evaluation today patient appears to be doing really about the same in regard to her legs. There is no signs of improvement overall and she as noted from home health does not appear to be elevating her legs he can get into her lift chair. There is too much stuff piled up on it the patient tells me. She also tells me she cannot really use her pumps effectively due to the fact that she cannot have any space to get them on. Finally she is also not really elevating her legs because she is not sleeping in her bed she is sleeping in her chair currently and again overall I think everything that she is done in combination has been exactly the wrong thing for what she needs for her legs. 04/04/2020 upon evaluation today patient appears to be doing well at this time with regard to her legs. She is actually been pumping, keeping her wraps on, and to be honest she seems to be doing dramatically better the right leg is excellent the left leg is also excellent and measuring much smaller than previous. 04/18/2020 upon evaluation today patient actually is continue to make good progress in regard to her lower extremities bilaterally. Everything is improving and less wet that has been in the past overall  I am extremely pleased with where things stand and I think that she is making great progress. The patient tells me she still continue to use her compression  pumps 05/02/2020 on evaluation today patient appears to be doing well at this time in regard to her left leg which is showing signs of drying up. With that being said she does have a lot of lymphedema type crusty skin around the toes of her left foot and the right medial ankle which has opened at this point. Fortunately there is no signs of active infection systemically at this point or even locally for that matter. 05/23/2020 on evaluation today patient appears to be doing well with regard to her lower extremities. Fortunately there is no signs of active infection at this time. No fever chills noted. She has been very depressed however she tells me. 06/06/2020 patient came in today for evaluation in regard to her bilateral lower extremity ulcerations. With that being said she came in feeling okay and actually laughing and joking around with the staff checking her in. Subsequently however she had a coughing spell and following the coughing spell it was a dramatic conversion from being jovial and joking around to being extremely short of breath her vital signs actually dropped in regard to her blood pressure from around 175 to down around 244 for systolic and from around 96 diastolic down to around 70. With that being said she also accompanied this with an increase in her respiratory rate which was also quite significant. Nonetheless I actually upon going into see her was extremely worried we called EMS to have her transported to the ER for further evaluation and treatment. She continued until EMS got here to be extremely short of breath even on 6 L of oxygen. Her oxygen saturation did come up to 99% but overall it was only 95 before which was not terrible she did feel like the oxygen helped her feel somewhat better however. She has a history of asthma but again even the coughing spell really should not have done this degree of alteration in her demeanor from where she was just before to after the  coughing spell. She tells [place] however she has been feeling somewhat abnormal since Friday although she could not really pinpoint exactly what was going on. 06/27/2020 plan evaluation today patient appears to be doing okay in regard to her leg ulcers. She is really not showing a lot of improvement to be honest and she still has a lot of weeping. With that being said after I last saw her we called EMS to take her to the hospital she actually did not go she went to see her primary care provider who according to the patient have not identified and read through the note states that everything checked out okay. Nonetheless the patient has continued to have bouts where she gets very short of breath for seemingly no reason whatsoever. That happened even the same night that I saw her last time. This was after she refused transport to the hospital. With that being said she also tells me that just getting dressed to come here is quite a chore which is putting on her shirt causing her to have to stop to catch her breath. Obviously this is not normal and I feel like there is something going on. She may need a referral to be seen by cardiology ASAP. She does see her primary care provider early next week she tells me 07/11/2020 upon evaluation today patient's wounds  again appear to be doing about the same she mainly has weeping of the bilateral lower extremities both are very swollen today she tells me she has been using her pumps but last time I saw her she told me she did not even have her pumps out that she had "packed them away because they had too much stuff in their house. Apparently her husband has been bringing stuff in from storage units. She then subsequently told me that she had so much stuff in her house that she has been staying up all night and did not go to bed till 7:00 this morning because she was cleaning up stuff. Nonetheless I am still unsure as to whether or not she is using the pumps based on what I  am seeing I would think probably not. Nonetheless she has been keeping the compression wraps in place that is at least something 07/25/2020 upon evaluation today patient appears to be doing well currently in regard to her legs all things considered. I do not think she is doing any worse significantly although honestly I do not think she is doing a lot better either. There does not appear to be any signs of infection which is good news although she does have a lot of weeping. She is using her pumps she tells me sporadically though she says that her husband is not a big fan of helping her to get the [place]. She also tells me is difficult because in their house. It would be easiest for her to use these as where her son is actually staying with her and living out at this point. Obviously I understand that she is having a lot of issues here and to be honest I do not really know what to say in that regard and the fact that I really cannot change the situations but I do believe she really needs to be using the lymphedema pumps to try to keep things under control here. Outside of that I think she is going to continue to have significant issues. I think the pumps coupled with good compression and elevation are the only things that are to help her at this point. 08/08/2020 upon evaluation today patient appears to be doing a little worse in regard to her legs in general. I think this may be due to some infection currently. With that being said I am going to go ahead and likely see about putting her on an antibiotic. Were also can obtain a culture today where she had some purulent drainage. 08/29/2020 upon evaluation today patient appears to be doing about the same in regard to her bilateral lower extremities. Unfortunately she is continuing to have significant issues here with edema and she has significant lymphedema. With that being said she is really not doing anything that she is supposed to be doing as far as  elevation, compression, using lymphedema pumps or anything really for that matter. She does not use her pumps, is taken the wraps off shortly after having them put on as far as home health is concerned at least by the next day she tells me, and overall is not really elevating her legs she is telling me she has a lot to do as far as cleaning up in her house. Nonetheless this triad of noncompliance is leading to worsening not improving in regard to her legs. I had a very strong conversation with her about this today again. 09/12/2020 on evaluation today patient appears to be doing poorly with regard  to her lower extremities bilaterally. Fortunately there does not appear to be any signs of active infection at this point. That is systemically. Locally there is some signs of pus coming from an area on her left leg. She has been on antibiotics recently though she is done with those currently. Nonetheless she tells me she has been having increased pain with the right leg this is extremely swollen as well. Fortunately there does not appear to be significant infection of the right leg though I think her pain is really as result of the increased swelling she has been declining the wrap on the right leg and the wounds here are getting significantly worse week by week. This obviously is not will be want to see. I discussed with the patient however if she does not use the wraps, use her pumps, and elevate her legs that she is not to get better. She has been sleeping in her motorized Hoveround which does not even allow her to elevate her legs at all. She tells me that she is also constantly "cleaning up as her husband told her that he wants to move back to [place]." Unfortunately this has been the narrative for the past several months in fact that she tells me that she has been having to clean up mass that was moved in from storage units into their home and that she does not really sleep and she never really  elevates her legs. I explained to the patient today that if she is not can I do any of this that there is really nothing that I can do to help her. 09/26/20 upon evaluation today patient appears to be doing about the same in regard to her wounds. Fortunately there is no signs of active infection at this time. No fever chills noted. She has been tolerating the dressing changes without complication which is good news. With that being said I do think that she unfortunately is still having a lot of pain but I think this is due to the swelling that really is not controlled and as I discussed with her last time she needs to be using her lymphedema pumps there is still in the box she has not even attempted to use those whatsoever. I am also not even certain she is taking her fluid pills as she states she may need to have "get a refill" she was seen with her husband at the appointment today. 10/24/2020 upon evaluation today patient appears to be doing decently well in regard to her legs all things considered. She has been tolerating the dressing changes without complication. Fortunately there is no signs of active infection at this time. No fevers, chills, nausea, vomiting, or diarrhea. With that being said the patient does appear to have some excessive thickened skin buildup currently that has been require some sharp debridement to clear this away. I am hopeful if we do this we will be able to get some of this area to dry out this otherwise trapping fluid underneath. Her son is present during the office visit today. She tells me she still been doing a lot as far as cleaning up and going through boxes she tells me she has been up for a long time already this morning. 5/25; patient presents for 2-week follow-up. She had 3 layer compression wrap placed with calcium alginate underneath. She reports tolerating the wraps well. She has home health that changes the wrap 2-3 times a week. She denies signs of infection.  She has  almost finished her course of antibiotics. She uses her lymphedema pumps once weekly. 11/21/2020 upon evaluation today patient appears to be doing well with regard to her legs all things considered. Fortunately there is no signs of active infection at this time which is great news I do not see any evidence of infection and overall I think that she is definitely improved compared to where things did previous. Nonetheless I do think that she may benefit from removing some of the thicker skin on the legs. Again we have attempted this in the past to some degree but I think that we might be able to more effectively do this of a clear some way and then potentially use a urea cream with salicylic acid to try to help clear some of this away. The patient is in agreement with the plan. 12/05/2020 upon evaluation today patient appears to be doing well with regard to her wounds in general. I feel like that things are somewhat improved the skin looking a little better. With that being said she still has significant lymphedema. She is not been using her lymphedema pumps and not really been elevating her legs. She also did have some maggots noted on the left leg she tells me that the "gnats are terrible and get all over her foot." With that being said I am concerned about the fact that the patient still is quite swollen even with the compression wraps I really feel like she needs to be using her pumps but I cannot talk her into doing this. Objective Constitutional Well-nourished and well-hydrated in no acute distress. Vitals Time Taken: 11:23 AM, Height: 62 in, Weight: 335 lbs, BMI: 61.3, Temperature: 98.6 F, Pulse: 90 bpm, Respiratory Rate: 20 breaths/min, Blood Pressure: 159/76 mmHg, Capillary Blood Glucose: 191 mg/dl. Respiratory normal breathing without difficulty. Psychiatric this patient is able to make decisions and demonstrates good insight into disease process. Alert and Oriented x 3. pleasant  and cooperative. General Notes: Upon inspection patient's wound bed actually showed signs again of some improvement with regard to the skin overall condition all especially on the left leg but in general she is really not doing a whole lot better than she was previous. She is also really not doing much to help her self from the standpoint of elevation and the use of her lymphedema pumps. Integumentary (Hair, Skin) Wound #61 status is Open. Original cause of wound was Gradually Appeared. The date acquired was: 04/20/2019. The wound has been in treatment 85 weeks. The wound is located on the Left,Circumferential Lower Leg. The wound measures 12.5cm length x 24cm width x 0.1cm depth; 235.619cm^2 area and 23.562cm^3 volume. There is Fat Layer (Subcutaneous Tissue) exposed. There is no tunneling or undermining noted. There is a large amount of serous drainage noted. The wound margin is indistinct and nonvisible. There is large (67-100%) pink, pale granulation within the wound bed. There is no necrotic tissue within the wound bed. General Notes: x6 maggots noted to left leg. Wound #64 status is Open. Original cause of wound was Gradually Appeared. The date acquired was: 06/01/2019. The wound has been in treatment 79 weeks. The wound is located on the Left,Dorsal Foot. The wound measures 6.5cm length x 6cm width x 0.1cm depth; 30.631cm^2 area and 3.063cm^3 volume. There is Fat Layer (Subcutaneous Tissue) exposed. There is no tunneling or undermining noted. There is a medium amount of serous drainage noted. The wound margin is indistinct and nonvisible. There is large (67-100%) pink, pale granulation within the wound  bed. There is no necrotic tissue within the wound bed. Wound #67 status is Open. Original cause of wound was Gradually Appeared. The date acquired was: 05/02/2020. The wound has been in treatment 31 weeks. The wound is located on the Right,Medial Lower Leg. The wound measures 5cm length x 4cm  width x 0.1cm depth; 15.708cm^2 area and 1.571cm^3 volume. There is Fat Layer (Subcutaneous Tissue) exposed. There is no tunneling or undermining noted. There is a medium amount of serosanguineous drainage noted. The wound margin is indistinct and nonvisible. There is large (67-100%) pink, pale granulation within the wound bed. There is a small (1-33%) amount of necrotic tissue within the wound bed including Adherent Slough. 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 There was a Three Layer Compression Therapy Procedure by Rhae Hammock, RN. Post procedure Diagnosis Wound #61: Same as Pre-Procedure Wound #67 Pre-procedure diagnosis of Wound #67 is a Diabetic Wound/Ulcer of the Lower Extremity located on the Right,Medial Lower Leg . There was a Three Layer Compression Therapy Procedure by Rhae Hammock, RN. Post procedure Diagnosis Wound #67: Same as Pre-Procedure Plan Follow-up Appointments: Return Appointment in 2 weeks. Bathing/ Shower/ Hygiene: May shower and wash wound with soap and water. - with dressing changes, wash both legs with wash cloth and soap and water with dressing changes Other Bathing/Shower/Hygiene  Orders/Instructions: - Home health to wash legs with warn soapy water and a clean wash cloth with dressing changes Edema Control - Lymphedema / SCD / Other: Lymphedema Pumps. Use Lymphedema pumps on leg(s) 2-3 times a day for 45-60 minutes. If wearing any wraps or hose, do not remove them. Continue exercising as instructed. Elevate legs to the level of the heart or above for 30 minutes daily and/or when sitting, a frequency of: - especially at night Avoid standing for long periods of time. Exercise regularly Home Health: No change in wound care orders this week; continue Home Health for wound care. May utilize formulary equivalent dressing for wound treatment orders unless otherwise specified. - add urea cream to legs with dressing changes when available Dressing changes to be completed by [place] on Monday / Wednesday / Friday except when patient has scheduled visit at [facility]. Other Home Health Orders/Instructions: - [facility] WOUND #61: - Lower Leg Wound Laterality: Left, Circumferential Peri-Wound Care: Sween Lotion (Moisturizing lotion) (Home Health) 3 x Per Week/30 Days Discharge Instructions: Apply moisturizing lotion to dry skin on legs Peri-Wound Care: Urea Cream 40%, tube, 1 (oz) 3 x Per Week/30 Days Discharge Instructions: apply to lower legs with dressing changes Prim Dressing: Maxorb Extra Calcium Alginate 2x2 in (Home Health) 3 x Per Week/30 Days ary Discharge Instructions: Apply calcium alginate to wound bed as instructed Secondary Dressing: ABD Pad, 8x10 (Home Health) 3 x Per Week/30 Days Discharge Instructions: Apply over primary dressing as directed. Secondary Dressing: Zetuvit Plus 4x4 in (Home  Health) 3 x Per Week/30 Days Discharge Instructions: or equivalent extra absorbent pad.Apply over primary dressing as directed. Com pression Wrap: ThreePress (3 layer compression wrap) (Home Health) 3 x Per Week/30 Days Discharge Instructions: Apply three layer  compression as directed. Pad bend of ankle with foam or ABD pad. WOUND #64: - Foot Wound Laterality: Dorsal, Left Peri-Wound Care: Sween Lotion (Moisturizing lotion) (Home Health) 3 x Per Week/30 Days Discharge Instructions: Apply moisturizing lotion to dry skin on legs Peri-Wound Care: Urea Cream 40%, tube, 1 (oz) 3 x Per Week/30 Days Discharge Instructions: apply to lower legs with dressing changes Prim Dressing: Maxorb Extra Calcium Alginate 2x2 in (Home Health) 3 x Per Week/30 Days ary Discharge Instructions: Apply calcium alginate to wound bed as instructed Secondary Dressing: ABD Pad, 8x10 (Home Health) 3 x Per Week/30 Days Discharge Instructions: Apply over primary dressing, also cover toes with ABD pad for closed dressing Secondary Dressing: Zetuvit Plus 4x4 in (Home Health) 3 x Per Week/30 Days Discharge Instructions: or equivalent extra absorbent pad.Apply over primary dressing as directed. Com pression Wrap: ThreePress (3 layer compression wrap) (Home Health) 3 x Per Week/30 Days Discharge Instructions: Apply three layer compression as directed. Pad bend of ankle with foam or ABD pad. WOUND #67: - Lower Leg Wound Laterality: Right, Medial Peri-Wound Care: Sween Lotion (Moisturizing lotion) (Home Health) 3 x Per Week/30 Days Discharge Instructions: Apply moisturizing lotion to dry skin on legs Peri-Wound Care: Urea Cream 40%, tube, 1 (oz) 3 x Per Week/30 Days Discharge Instructions: apply to lower legs with dressing changes Prim Dressing: Maxorb Extra Calcium Alginate 2x2 in (Home Health) 3 x Per Week/30 Days ary Discharge Instructions: Apply calcium alginate to wound bed as instructed Secondary Dressing: ABD Pad, 8x10 (Home Health) 3 x Per Week/30 Days Discharge Instructions: Apply over primary dressing, also cover toes with ABD pad for closed dressing Secondary Dressing: Zetuvit Plus 4x4 in (Home Health) 3 x Per Week/30 Days Discharge Instructions: or equivalent extra absorbent  pad.Apply over primary dressing as directed. Com pression Wrap: ThreePress (3 layer compression wrap) (Home Health) 3 x Per Week/30 Days Discharge Instructions: Apply three layer compression as directed. Pad bend of ankle with foam or ABD pad. 1. Would recommend currently that we going to continue with wound care measures as before. This includes the use of the urea cream to the leg which I think would be good. Following this we use an silver alginate to the open areas ABD pads, Decubivite, and a 3 layer compression wrap bilaterally. 2. I do believe the patient needs to keep her toes covered with an ABD pad so that no flies or otherwise can get this to the legs which is where obviously the maggots come from. 3. I am also can recommend that the patient going continue with recommendations for elevating her legs and using her lymphedema pumps twice a day for an hour each time. Again she is really not doing much of either which I think is part of her problem she is also not getting better but there is literally nothing I can do to correct the situation. We will see patient back for reevaluation in 1 week here in the clinic. If anything worsens or changes patient will contact our office for additional recommendations. Electronic Signature(s) Signed: 12/05/2020 12:12:01 PM By: [name] PA-C Entered By: [name] on 12/05/2020 12:12:01 -------------------------------------------------------------------------------- SuperBill Details Patient Name: Date of Service: [name] 12/05/2020 Medical Record Number: [id number] Patient Account Number: [id number] Date  of Birth/Sex: Treating RN: 1950-12-23 (70 y.o. [name] Primary Care Provider: [name] Other Clinician: Referring Provider: Treating Provider/Extender: Darlen Round in Treatment: 174 Diagnosis Coding ICD-10 Codes Code Description E11.622 Type 2 diabetes mellitus with other skin  ulcer I89.0 Lymphedema, not elsewhere classified I87.331 Chronic venous hypertension (idiopathic) with ulcer and inflammation of right lower extremity I87.332 Chronic venous hypertension (idiopathic) with ulcer and inflammation of left lower extremity L97.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 leg with fat layer exposed L97.822 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 I10 Essential (primary) hypertension E66.01 Morbid (severe) obesity due to excess calories F41.8 Other specified anxiety disorders R53.1 Weakness Facility Procedures The patient participates with Medicare or their insurance follows the Medicare Facility Guidelines: CPT4 Description Modifier Quantity Code [id number] BILATERAL: Application of multi-layer venous compression system; leg (below knee), including ankle and 1 foot. Physician Procedures : CPT4 Code Description Modifier [id number] - WC PHYS LEVEL 4 - EST PT ICD-10 Diagnosis Description E11.622 Type 2 diabetes mellitus with other skin ulcer I89.0 Lymphedema, not elsewhere classified I87.331 Chronic venous hypertension (idiopathic) with  ulcer and inflammation of right lower extremity I87.332 Chronic venous hypertension (idiopathic) with ulcer and inflammation of left lower extremity Quantity: 1 Electronic Signature(s) Signed: 12/05/2020 12:12:15 PM By: [name] PA-C Entered By: [name] on 12/05/2020 12:12:14

## 2020-12-05 NOTE — Progress Notes (Signed)
[name], [name] ([id number]) Visit Report for 12/05/2020 Arrival Information Details Patient Name: Date of Service: [name], [name] 12/05/2020 10:45 A M Medical Record Number: [id number] Patient Account Number: [id number] Date of Birth/Sex: Treating RN: 1951-02-19 (70 y.o. Martyn Malay, Linda Primary Care Lauralye Kinn: [name] Other Clinician: Referring Wendy Hoback: Treating Arlet Marter/Extender: Darlen Round in Treatment: 87 Visit Information History Since Last Visit Added or deleted any medications: No Patient Arrived: Wheel Chair Any new allergies or adverse reactions: No Arrival Time: 11:22 Had a fall or experienced change in No Accompanied By: self activities of daily living that may affect Transfer Assistance: None risk of falls: Patient Identification Verified: Yes Signs or symptoms of abuse/neglect since last visito No Secondary Verification Process Completed: Yes Hospitalized since last visit: No Patient Requires Transmission-Based Precautions: No Implantable device outside of the clinic excluding No Patient Has Alerts: Yes cellular tissue based products placed in the center Patient Alerts: R ABI= 1.01 since last visit: L ABI = .99 Has Dressing in Place as Prescribed: Yes Pain Present Now: Yes Electronic Signature(s) Signed: 12/05/2020 4:31:55 PM By: Sandre Kitty Entered By: Sandre Kitty on 12/05/2020 11:23:11 -------------------------------------------------------------------------------- Compression Therapy Details Patient Name: Date of Service: [name] 12/05/2020 10:45 A M Medical Record Number: [id number] Patient Account Number: [id number] Date of Birth/Sex: Treating RN: 17-Apr-1951 (70 y.o. [name] Primary Care Aloysius Heinle: [name] Other Clinician: Referring Zully Frane: Treating Kelsei Defino/Extender: Darlen Round in Treatment: 174 Compression Therapy Performed for Wound Assessment: Wound #61  Left,Circumferential Lower Leg Performed By: Clinician Rhae Hammock, RN Compression Type: Three Layer Post Procedure Diagnosis Same as Pre-procedure Electronic Signature(s) Signed: 12/05/2020 5:44:00 PM By: [name] RN, BSN Entered By: [name] on 12/05/2020 12:06:12 -------------------------------------------------------------------------------- Compression Therapy Details Patient Name: Date of Service: [name] 12/05/2020 10:45 A M Medical Record Number: [id number] Patient Account Number: [id number] Date of Birth/Sex: Treating RN: 08/30/1950 (70 y.o. [name] Primary Care Dewie Ahart: [name] Other Clinician: Referring Coury Grieger: Treating Brileigh Sevcik/Extender: Darlen Round in Treatment: 174 Compression Therapy Performed for Wound Assessment: Wound #67 Right,Medial Lower Leg Performed By: Clinician Rhae Hammock, RN Compression Type: Three Layer Post Procedure Diagnosis Same as Pre-procedure Electronic Signature(s) Signed: 12/05/2020 5:44:00 PM By: [name] RN, BSN Entered By: [name] on 12/05/2020 12:06:12 -------------------------------------------------------------------------------- Encounter Discharge Information Details Patient Name: Date of Service: [name]. 12/05/2020 10:45 A M Medical Record Number: [id number] Patient Account Number: [id number] Date of Birth/Sex: Treating RN: 03/14/1951 (70 y.o. [name], Lauren Primary Care Aya Geisel: [name] Other Clinician: Referring Keyonte Cookston: Treating Ellisha Bankson/Extender: Darlen Round in Treatment: [phone] Encounter Discharge Information Items Discharge Condition: Stable Ambulatory Status: Wheelchair Discharge Destination: Home Transportation: Private Auto Accompanied By: husbandd Schedule Follow-up Appointment: Yes Clinical Summary of Care: Patient Declined Electronic Signature(s) Signed: 12/05/2020 5:40:17 PM By:  Rhae Hammock RN Entered By: Rhae Hammock on 12/05/2020 12:28:48 -------------------------------------------------------------------------------- Lower Extremity Assessment Details Patient Name: Date of Service: [name] 12/05/2020 10:45 A M Medical Record Number: [id number] Patient Account Number: [id number] Date of Birth/Sex: Treating RN: 1951-03-28 (70 y.o. [name], [name] Primary Care Vickii Volland: [name] Other Clinician: Referring Aubriegh Minch: Treating Misk Galentine/Extender: Darlen Round in Treatment: 174 Edema Assessment Assessed: [name]: Yes] [name]: Yes] Edema: [Left: Yes] [Right: Yes] Calf Left: Right: Point of Measurement: 37 cm From Medial Instep 47 cm 40.3 cm Ankle Left: Right: Point of Measurement: 10 cm From Medial Instep 30.5 cm 24  cm Electronic Signature(s) Signed: 12/05/2020 5:52:09 PM By: Deon Pilling Entered By: Deon Pilling on 12/05/2020 11:46:12 -------------------------------------------------------------------------------- Multi-Disciplinary Care Plan Details Patient Name: Date of Service: [name]. 12/05/2020 10:45 A M Medical Record Number: [id number] Patient Account Number: [id number] Date of Birth/Sex: Treating RN: 04/05/1951 (70 y.o. [name] Primary Care Cherril Hett: [name] Other Clinician: Referring Marvie Brevik: Treating Gerri Acre/Extender: Darlen Round in Treatment: [place] reviewed with physician Active Inactive Venous Leg Ulcer Nursing Diagnoses: Actual venous Insuffiency (use after diagnosis is confirmed) Knowledge deficit related to disease process and management Goals: Patient will maintain optimal edema control Date Initiated: 08/12/2017 Target Resolution Date: 12/19/2020 Goal Status: Active Patient/caregiver will verbalize understanding of disease process and disease management Date Initiated: 08/12/2017 Date Inactivated:  04/21/2018 Target Resolution Date: 04/24/2018 Goal Status: Met Interventions: Assess peripheral edema status every visit. Compression as ordered Treatment Activities: Therapeutic compression applied : 08/12/2017 Notes: Wound/Skin Impairment Nursing Diagnoses: Impaired tissue integrity Knowledge deficit related to ulceration/compromised skin integrity Goals: Patient/caregiver will verbalize understanding of skin care regimen Date Initiated: 08/12/2017 Target Resolution Date: 12/19/2020 Goal Status: Active Ulcer/skin breakdown will have a volume reduction of 30% by week 4 Date Initiated: 08/05/2017 Date Inactivated: 09/30/2017 Target Resolution Date: 10/03/2017 Goal Status: Met Ulcer/skin breakdown will have a volume reduction of 50% by week 8 Date Initiated: 09/30/2017 Date Inactivated: 10/28/2017 Target Resolution Date: 10/28/2017 Goal Status: Met Interventions: Assess patient/caregiver ability to perform ulcer/skin care regimen upon admission and as needed Assess ulceration(s) every visit Provide education on ulcer and skin care Screen for HBO Treatment Activities: Patient referred to home care : 08/05/2017 Skin care regimen initiated : 08/05/2017 Topical wound management initiated : 08/05/2017 Notes: Electronic Signature(s) Signed: 12/05/2020 5:44:00 PM By: [name] RN, BSN Entered By: [name] on 12/05/2020 12:04:47 -------------------------------------------------------------------------------- Pain Assessment Details Patient Name: Date of Service: [name]. 12/05/2020 10:45 A M Medical Record Number: [id number] Patient Account Number: [id number] Date of Birth/Sex: Treating RN: 1951/01/06 (70 y.o. [name] Primary Care Lenette Rau: [name] Other Clinician: Referring Ryin Schillo: Treating Jasaiah Karwowski/Extender: Darlen Round in Treatment: [id number] Active Problems Location of Pain Severity and Description of Pain Patient Has Paino  No Site Locations Pain Management and Medication Current Pain Management: Electronic Signature(s) Signed: 12/05/2020 4:31:55 PM By: Sandre Kitty Signed: 12/05/2020 5:44:00 PM By: [name] RN, BSN Entered By: Sandre Kitty on 12/05/2020 11:23:51 -------------------------------------------------------------------------------- Patient/Caregiver Education Details Patient Name: Date of Service: [name] 6/22/2022andnbsp10:45 [place] Record Number: [id number] Patient Account Number: [id number] Date of Birth/Gender: Treating RN: 1950/12/13 (70 y.o. [name] Primary Care Physician: [name] Other Clinician: Referring Physician: Treating Physician/Extender: Darlen Round in Treatment: 37 Education Assessment Education Provided To: Patient Education Topics Provided Venous: Methods: Explain/Verbal Responses: Reinforcements needed, State content correctly Wound/Skin Impairment: Methods: Explain/Verbal Responses: Reinforcements needed, State content correctly Electronic Signature(s) Signed: 12/05/2020 5:44:00 PM By: [name] RN, BSN Entered By: [name] on 12/05/2020 12:05:43 -------------------------------------------------------------------------------- Wound Assessment Details Patient Name: Date of Service: [name]. 12/05/2020 10:45 A M Medical Record Number: [id number] Patient Account Number: [id number] Date of Birth/Sex: Treating RN: [birth year] (70 y.o. [name] Primary Care Mei Suits: [name] Other Clinician: Referring Faun Mcqueen: Treating Colene Mines/Extender: Darlen Round in Treatment: 174 Wound Status Wound Number: 61 Primary Venous Leg Ulcer Etiology: Wound Location: Left, Circumferential Lower Leg Wound Open Wounding Event: Gradually Appeared Status: Date Acquired: 04/20/2019 Comorbid Asthma, Hypertension,  Peripheral Arterial Disease, Peripheral Weeks Of  Treatment: 85 History: Venous Disease, Type II Diabetes, Gout, Osteoarthritis Clustered Wound: Yes Photos Wound Measurements Length: (cm) 12.5 Width: (cm) 24 Depth: (cm) 0.1 Area: (cm) 235.619 Volume: (cm) 23.562 Wound Description Classification: Full Thickness Without Exposed Support Struct Wound Margin: Indistinct, nonvisible Exudate Amount: Large Exudate Type: Serous Exudate Color: amber ures Foul Odor After Cleansing: N Slough/Fibrino N % Reduction in Area: -3024.9% % Reduction in Volume: -3024.9% Epithelialization: None Tunneling: No Undermining: No o o Wound Bed Granulation Amount: Large (67-100%) Exposed Structure Granulation Quality: Pink, Pale Fascia Exposed: No Necrotic Amount: None Present (0%) Fat Layer (Subcutaneous Tissue) Exposed: Yes Tendon Exposed: No Muscle Exposed: No Joint Exposed: No Bone Exposed: No Assessment Notes x6 maggots noted to left leg. Treatment Notes Wound #61 (Lower Leg) Wound Laterality: Left, Circumferential Cleanser Peri-Wound Care Sween Lotion (Moisturizing lotion) Discharge Instruction: Apply moisturizing lotion to dry skin on legs Urea Cream 40%, tube, 1 (oz) Discharge Instruction: apply to lower legs with dressing changes Topical Primary Dressing Maxorb Extra Calcium Alginate 2x2 in Discharge Instruction: Apply calcium alginate to wound bed as instructed Secondary Dressing ABD Pad, 8x10 Discharge Instruction: Apply over primary dressing as directed. Zetuvit Plus 4x4 in Discharge Instruction: or equivalent extra absorbent pad.Apply over primary dressing as directed. Secured With Compression Wrap ThreePress (3 layer compression wrap) Discharge Instruction: Apply three layer compression as directed. Pad bend of ankle with foam or ABD pad. Compression Stockings Add-Ons Electronic Signature(s) Signed: 12/05/2020 4:31:55 PM By: Sandre Kitty Signed: 12/05/2020 5:44:00 PM By: [name] RN, BSN Entered By:  Sandre Kitty on 12/05/2020 16:15:03 -------------------------------------------------------------------------------- Wound Assessment Details Patient Name: Date of Service: [name] 12/05/2020 10:45 A M Medical Record Number: [id number] Patient Account Number: [id number] Date of Birth/Sex: Treating RN: May 02, 1951 (70 y.o. [name] Primary Care Hilja Kintzel: [name] Other Clinician: Referring Aretta Stetzel: Treating Anacarolina Evelyn/Extender: Darlen Round in Treatment: 174 Wound Status Wound Number: 64 Primary Diabetic Wound/Ulcer of the Lower Extremity Etiology: Wound Location: Left, Dorsal Foot Wound Open Wounding Event: Gradually Appeared Status: Date Acquired: 06/01/2019 Comorbid Asthma, Hypertension, Peripheral Arterial Disease, Peripheral Weeks Of Treatment: 79 History: Venous Disease, Type II Diabetes, Gout, Osteoarthritis Clustered Wound: No Photos Wound Measurements Length: (cm) 6.5 Width: (cm) 6 Depth: (cm) 0.1 Area: (cm) 30.631 Volume: (cm) 3.063 % Reduction in Area: -18464.2% % Reduction in Volume: -6151% Epithelialization: Small (1-33%) Tunneling: No Undermining: No Wound Description Classification: Grade 1 Wound Margin: Indistinct, nonvisible Exudate Amount: Medium Exudate Type: Serous Exudate Color: amber Foul Odor After Cleansing: No Slough/Fibrino No Wound Bed Granulation Amount: Large (67-100%) Exposed Structure Granulation Quality: Pink, Pale Fascia Exposed: No Necrotic Amount: None Present (0%) Fat Layer (Subcutaneous Tissue) Exposed: Yes Tendon Exposed: No Muscle Exposed: No Joint Exposed: No Bone Exposed: No Treatment Notes Wound #64 (Foot) Wound Laterality: Dorsal, Left Cleanser Peri-Wound Care Sween Lotion (Moisturizing lotion) Discharge Instruction: Apply moisturizing lotion to dry skin on legs Urea Cream 40%, tube, 1 (oz) Discharge Instruction: apply to lower legs with dressing  changes Topical Primary Dressing Maxorb Extra Calcium Alginate 2x2 in Discharge Instruction: Apply calcium alginate to wound bed as instructed Secondary Dressing ABD Pad, 8x10 Discharge Instruction: Apply over primary dressing, also cover toes with ABD pad for closed dressing Zetuvit Plus 4x4 in Discharge Instruction: or equivalent extra absorbent pad.Apply over primary dressing as directed. Secured With Compression Wrap ThreePress (3 layer compression wrap) Discharge Instruction: Apply three layer compression as directed. Pad bend of ankle with foam  or ABD pad. Compression Stockings Add-Ons Electronic Signature(s) Signed: 12/05/2020 4:31:55 PM By: Sandre Kitty Signed: 12/05/2020 5:44:00 PM By: [name] RN, BSN Entered By: Sandre Kitty on 12/05/2020 16:14:04 -------------------------------------------------------------------------------- Wound Assessment Details Patient Name: Date of Service: [name] 12/05/2020 10:45 A M Medical Record Number: [id number] Patient Account Number: [id number] Date of Birth/Sex: Treating RN: [birth year] (70 y.o. [name] Primary Care Kinzee Happel: [name] Other Clinician: Referring Maley Venezia: Treating Thoren Hosang/Extender: Darlen Round in Treatment: 174 Wound Status Wound Number: 67 Primary Diabetic Wound/Ulcer of the Lower Extremity Etiology: Wound Location: Right, Medial Lower Leg Wound Open Wounding Event: Gradually Appeared Status: Date Acquired: 05/02/2020 Comorbid Asthma, Hypertension, Peripheral Arterial Disease, Peripheral Weeks Of Treatment: 31 History: Venous Disease, Type II Diabetes, Gout, Osteoarthritis Clustered Wound: No Photos Wound Measurements Length: (cm) 5 Width: (cm) 4 Depth: (cm) 0.1 Area: (cm) 15.708 Volume: (cm) 1.571 % Reduction in Area: -1010.9% % Reduction in Volume: -1014.2% Epithelialization: Small (1-33%) Tunneling: No Undermining: No Wound  Description Classification: Grade 1 Wound Margin: Indistinct, nonvisible Exudate Amount: Medium Exudate Type: Serosanguineous Exudate Color: red, brown Foul Odor After Cleansing: No Slough/Fibrino No Wound Bed Granulation Amount: Large (67-100%) Exposed Structure Granulation Quality: Pink, Pale Fascia Exposed: No Necrotic Amount: Small (1-33%) Fat Layer (Subcutaneous Tissue) Exposed: Yes Necrotic Quality: Adherent Slough Tendon Exposed: No Muscle Exposed: No Joint Exposed: No Bone Exposed: No Treatment Notes Wound #67 (Lower Leg) Wound Laterality: Right, Medial Cleanser Peri-Wound Care Sween Lotion (Moisturizing lotion) Discharge Instruction: Apply moisturizing lotion to dry skin on legs Urea Cream 40%, tube, 1 (oz) Discharge Instruction: apply to lower legs with dressing changes Topical Primary Dressing Maxorb Extra Calcium Alginate 2x2 in Discharge Instruction: Apply calcium alginate to wound bed as instructed Secondary Dressing ABD Pad, 8x10 Discharge Instruction: Apply over primary dressing, also cover toes with ABD pad for closed dressing Zetuvit Plus 4x4 in Discharge Instruction: or equivalent extra absorbent pad.Apply over primary dressing as directed. Secured With Compression Wrap ThreePress (3 layer compression wrap) Discharge Instruction: Apply three layer compression as directed. Pad bend of ankle with foam or ABD pad. Compression Stockings Add-Ons Electronic Signature(s) Signed: 12/05/2020 4:31:55 PM By: Sandre Kitty Signed: 12/05/2020 5:44:00 PM By: [name] RN, BSN Entered By: Sandre Kitty on 12/05/2020 16:14:32 -------------------------------------------------------------------------------- Vitals Details Patient Name: Date of Service: [name]. 12/05/2020 10:45 A M Medical Record Number: [id number] Patient Account Number: [id number] Date of Birth/Sex: Treating RN: 1951-02-10 (70 y.o. [name] Primary Care Ruthe Roemer:  [name] Other Clinician: Referring Jung Ingerson: Treating Oakley Kossman/Extender: Darlen Round in Treatment: 174 Vital Signs Time Taken: 11:23 Temperature (F): 98.6 Height (in): 62 Pulse (bpm): 90 Weight (lbs): 335 Respiratory Rate (breaths/min): 20 Body Mass Index (BMI): 61.3 Blood Pressure (mmHg): 159/76 Capillary Blood Glucose (mg/dl): 191 Reference Range: 80 - 120 mg / dl Electronic Signature(s) Signed: 12/05/2020 4:31:55 PM By: Sandre Kitty Entered By: Sandre Kitty on 12/05/2020 11:23:42

## 2020-12-19 ENCOUNTER — Other Ambulatory Visit: Payer: Self-pay

## 2020-12-19 ENCOUNTER — Encounter (HOSPITAL_BASED_OUTPATIENT_CLINIC_OR_DEPARTMENT_OTHER): Payer: Medicare PPO | Attending: Physician Assistant | Admitting: Physician Assistant

## 2020-12-19 DIAGNOSIS — E1151 Type 2 diabetes mellitus with diabetic peripheral angiopathy without gangrene: Secondary | ICD-10-CM | POA: Insufficient documentation

## 2020-12-19 DIAGNOSIS — L97822 Non-pressure chronic ulcer of other part of left lower leg with fat layer exposed: Secondary | ICD-10-CM | POA: Diagnosis not present

## 2020-12-19 DIAGNOSIS — I890 Lymphedema, not elsewhere classified: Secondary | ICD-10-CM | POA: Diagnosis not present

## 2020-12-19 DIAGNOSIS — L97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 leg with fat layer exposed: Secondary | ICD-10-CM | POA: Diagnosis not present

## 2020-12-19 DIAGNOSIS — Z6841 Body Mass Index (BMI) 40.0 and over, adult: Secondary | ICD-10-CM | POA: Insufficient documentation

## 2020-12-19 DIAGNOSIS — E11621 Type 2 diabetes mellitus with foot ulcer: Secondary | ICD-10-CM | POA: Diagnosis not present

## 2020-12-19 DIAGNOSIS — E1140 Type 2 diabetes mellitus with diabetic neuropathy, unspecified: Secondary | ICD-10-CM | POA: Diagnosis not present

## 2020-12-19 DIAGNOSIS — I872 Venous insufficiency (chronic) (peripheral): Secondary | ICD-10-CM | POA: Insufficient documentation

## 2020-12-19 DIAGNOSIS — L97522 Non-pressure chronic ulcer of other part of left foot with fat layer exposed: Secondary | ICD-10-CM | POA: Diagnosis not present

## 2020-12-19 NOTE — Progress Notes (Addendum)
TEKOA, HAMOR ([id number]) Visit Report for 12/19/2020 Chief Complaint Document Details Patient Name: Date of Service: SHAUNIECE, KWAN 12/19/2020 11:00 A M Medical Record Number: [id number] Patient Account Number: [id number] Date of Birth/Sex: Treating RN: Oct 22, 1950 (70 y.o. [name] Primary Care Provider: [name] Other Clinician: Referring Provider: Treating Provider/Extender: Darlen Round in Treatment: 176 Information Obtained from: Patient Chief Complaint Bilateral reoccurring LE ulcers Electronic Signature(s) Signed: 12/19/2020 11:31:45 AM By: [name] PA-C Entered By: [name] on 12/19/2020 11:31:44 -------------------------------------------------------------------------------- HPI Details Patient Name: Date of Service: [name] 12/19/2020 11:00 A M Medical Record Number: [id number] Patient Account Number: [id number] Date of Birth/Sex: Treating RN: [birth year] (70 y.o. [name] Primary Care Provider: [name] Other Clinician: Referring Provider: Treating Provider/Extender: Darlen Round in Treatment: 176 History of Present Illness HPI Description: this patient has been seen a couple of times before and returns with recurrent problems to her right and left lower extremity with swelling and weeping ulcerations due to not wearing her compression stockings which she had been advised to do during her last discharge, at the end of June 2018. During her last visit the patient had had normal arterial blood flow and her venous reflux study did not necessitate any surgical intervention. She was recommended compression and elevation and wound care. After prolonged treatment the patient was completely healed but she has been noncompliant with wearing or compressions.. She was here last week with an outpatient return visit planned but the patient came in a very poor general condition with altered  mental status and was rushed to the ER on my request. With a history of hypertension, diabetes, TIA and right-sided weakness she was set up for an MRI on her brain and cervical spine and was sent to [facility]. Getting an MRI done was very difficult but once the workup was done she was found not to have any spinal stenosis, epidural abscess or hematoma or discitis. This was radiculopathy to be treated as an outpatient and she was given a follow-up appointment. Today she is feeling much better alert and oriented and has come to reevaluate her bilateral lower extremity lymphedema and ulceration 03/25/2017 -- she was admitted to the hospital on 03/16/2017 and discharged on 03/18/2017 with left leg cellulitis and ulceration. She was started on vancomycin and Zosyn and x-ray showed no bony involvement. She was treated for a cellulitis with IV antibiotics changed to Rocephin and Flagyl and was discharged on oral Keflex and doxycycline to complete a 7 day course. Last hemoglobin A1c was 7.1 and her other ailments including hypertension got asthma were appropriately treated. 05/06/2017 -- she is awaiting the right size of compression stockings from [place] but other than that has been doing well. ====== Old notes 70 year old patient was seen one time last October and was lost to follow-up. She has recurrent problems with weeping and ulceration of her left lower extremity and has swelling of this for several years. It has been worse for the last 2 months. Past medical history is significant for diabetes mellitus type 2, hypertension, gout, morbid obesity, depressive disorders, hiatal hernia, migraines, status post knee surgery, risk of a cholecystectomy, vaginal hysterectomy and breast biopsy. She is not a smoker. As noted before she has never had a venous duplex study and an arterial ABI study was attempted but the left lower extremity was noncompressible 10/01/2016 -- had a lower extremity venous duplex  reflux evaluation which showed  no evidence of deep vein reflux in the right or left lower extremity, and no evidence of great saphenous vein reflux more than 500 ms in the right or left lower extremity, and the left small saphenous vein is incompetent but no vascular consult was recommended. review of her electronic medical records noted that the ABI was checked in July 2017 where the right ABI was normal limits and the left ABI could not be ascertained due to pain with cuff pressure but the waveforms are within normal limits. her arterial duplex study scheduled for April 27. 10/08/2016 -- the patient has various reasons for not having a compression on and for the last 3 days she has had no compression on her left lower extremity either due to pain or the lack of nursing help. She does not use her juxta lites either. 10/15/2016 -- the patient did not keep her appointment for arterial duplex study on April 27 and I have asked her to reschedule this. Her pain is out of proportion with the physical findings and she continuously fails to wear a compression wraps and cuts them off because she says she cannot tolerate the pain. She does not use her juxta lites either. 10/22/2016 -- he has rescheduled her arterial duplex study to May 21 and her pain today is a bit better. She has not been wearing her juxta lites on her right lower extremity but now understands that she needs to do this. She did tolerate the to press compression wrap on her left lower extremity 10/29/2016 --arterial duplex study is scheduled for next week and overall she has been tolerating her compression wraps and also using her juxta lites on her right lower extremity 11/05/2016 -- the right ABI was 0.95 the left was 1.03. The digit TBI is on the right was 0.83 on the left was 0.92 and she had biphasic flow through these vessels. The impression was that of normal lower extremity arterial study. 11/12/2016 -- her pain is minimal and she  is doing very well overall. 11/26/2016 -- she has got juxta lites and her insurance will not pay for additional dual layer compression stockings. She is going to order some from [place]. 05/12/2017 -- her juxta lites are very old and too big for her and these have not been helping with compression. She did get 20-30 mm compression stockings from [place] but she and her husband are unable to put these on. I believe she will benefit from bilateral Extremit-ease, compression stockings and we will measure her for these today. 05/20/2017 -- lymphedema on the left lower extremity has increased a lot and she has a open ulceration as a result of this. The right lower extremity is looking pretty good. She has decided to by the compression stockings herself and will get reimbursed by the home health, at a later date. 05/27/2017 -- her sciatica is bothering her a lot and she thought her left leg pain was caused due to the compression wrap and hence removed it and has significant lymphedema. There is no inflammation on this left lower extremity. 06/17/17 on evaluation today patient appears to be doing very well and in fact is completely healed in regard to her ulcerations. Unfortunately however she does have continued issues with lymphedema nonetheless. We did order compression garments for her unfortunately she states that the size that she received were large although we ordered medium. Obviously this means she is not getting the optimal compression. She does not have those with her today and therefore  we could not confirm and contact the company on her behalf. Nonetheless she does state that she is going to have her husband bring them by tomorrow so that we can verify and then get in touch with the company. No fevers, chills, nausea, or vomiting noted at this time. Overall patient is doing better otherwise and I'm pleased with the progress she has made. 07/01/17 on evaluation today patient appears to be doing  very well in regard to her bilateral lower extremity she does not have any openings at this point which is excellent news. Overall I'm pleased with how things have progressed up to this time. Since she is doing so well we did order her compression which we are seeing her today to ensure that it fits her properly and everything is doing well in that regard and then subsequently she will be discharged. ============ Old Notes: 03/31/16 patient presents today for evaluation concerning open wounds that she has over the left medial ankle region as well as the left dorsal foot. She has previously had this occur although it has been healed for a number of months after having this for about a year prior until her hospitalization on 01/05/16. At that point in time it appears that she was admitted to the hospital for left lower extremity cellulitis and was placed on vancomycin and Zosyn at that point. Eventually upon discharge on January 15, 2016 she was placed on doxycycline at that point in time. Later on 03/27/16 positive wound culture growing Escherichia coli this was switched to amoxicillin. Currently she tells me that she is having pain radiated to be a 7 out of 10 which can be as high as 10 out of 10 with palpation and manipulation of the wound. This wound appears to be mainly venous in nature due to the bilateral lower extremity venous stasis/lymphedema. This is definitely much worse on her left than the right side. She does have type 1 diabetes mellitus, hypertension, morbid obesity, and is wheelchair dependent.during the course of the hospital stay a blood culture was also obtained and fortunately appeared negative. She also had an x-ray of the tibia/fibula on the left which showed no acute bone abnormality. Her white blood cell count which was performed last on 03/25/16 was 7.3, hemoglobin 12.8, protein 7.1, albumin 3.0. Her urine culture appeared to be negative for any specific organisms. Patient did  have a left lower extremity venous duplex evaluation for DVT . This did not include venous reflux studies but fortunately was negative for DVT Patient also had arterial studies performed which revealed that she had a . normal ABI on the right though this was unable to be performed on the left secondary to pain that she was having around the ankle region due to the wound. However it was stated on report that she had biphasic pulses and apparently good blood flow. ========== 06/03/17 she is here in follow-up evaluation for right lower extremity ulcer. The right lower sure he has healed but she has reopened to the left medial malleolus and dorsal foot with weeping. She is waiting for new compression garments to arrive from home health, the previous compression garments were ill fitting. We will continue with compression bilaterally and follow-up in 2 weeks Readmission: 08/05/17 on evaluation today patient appears to be doing somewhat poorly in regard to her left lower extremity especially although the right lower extremity has a small area which may no longer be open. She has been having a lot of drainage from the  left lower extremity however he tells me that she has not been able to use the EXTREMIT-EASE Compression at this point. She states that she did better and was able to actually apply the Juxta-Lite compression although the wound that she has is too large and therefore really does not compress which is why she cannot wear it at this point. She has no one who can help her put it on regular basis her son can sometimes but he's not able to do it most of the time. I do believe that's why she has begun to weave and have issues as she is currently yet again. No fevers, chills, nausea, or vomiting noted at this time. Patient is no evidence of dementia. 08/12/17 on evaluation today patient appears to still be doing fairly well in regard to the draining areas/weeping areas at this point. With that being  said she unfortunately did go to the ER yesterday due to what was felt to be possibly a cellulitis. They place her on doxycycline by mouth and discharge her home. She definitely was not admitted. With that being said she states she has had more discomfort which has been unusual for her even compared to prior times and she's had infections.08/12/17 on evaluation today patient appears to still be doing fairly well in regard to the draining areas/weeping areas at this point. With that being said she unfortunately did go to the ER yesterday due to what was felt to be possibly a cellulitis. They place her on doxycycline by mouth and discharge her home. She definitely was not admitted. With that being said she states she has had more discomfort which has been unusual for her even compared to prior times and she's had infections. 08/19/17 put evaluation today patient tells me that she's been having a lot of what sounds to be neuropathic type pain in regard to her left lower extremity. She has been using over-the-counter topical bins again which some believe. That in order to apply the she actually remove the wrap we put on her last Wednesday on Thursday. Subsequently she has not had anything on compression wise since that time. The good news is a lot of the weeping areas appear to have closed at this point again I believe she would do better with compression but we are struggling to get her to actually use what she needs to at this point. No fevers, chills, nausea, or vomiting noted at this time. 09/03/17 on evaluation today patient appears to be doing okay in regard to her lower extremities in regard to the lymphedema and weeping. Fortunately she does not seem to show any signs of infection at this point she does have a little bit of weeping occurring in the right medial malleolus area. With that being said this does not appear to be too significant which is good news. 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 She has severe skin damage secondary to both of these features involving the dorsal left foot and medial left ankle and lower leg. Still has open areas in the left anterior foot. The area on the right closed over. She uses her own juxta light stockings. She does not have an arterial issue 09/16/17 on evaluation today patient actually appears to be doing excellent in regard to her bilateral lower extremity swelling. The Juxta-Lite compression wrap seem to be doing very well for her. She has not however been using the portion that goes over her foot.  Her left foot still is draining a little bit not nearly as significant as it has been in the past but still I do believe that she likely needs to utilize the full wrap including the foot portion of this will improve as well. She also has been apparently putting on a significant amount of Vaseline which also think is not helpful for her. I recommended that if she feels she needs something for moisturizer Eucerin will probably be better. 09/30/17 on evaluation today patient presents with several new open areas in regard to her left lower extremity although these appear to be minimal and mainly seem to be more moisture breakdown than anything. Fortunately she does not seem to have any evidence of infection which is great news. She has been tolerating the dressing changes without complication we are using silver alginate on the foot she has been using AB pads to have the legs and using her Juxta- Lite compression which seems to be controlling her swelling very well. Overall I'm pleased with the poor way she has progressed. 10/14/17 on evaluation today patient appears to be doing better in regard to her left lower extremity areas of weeping. She does still have some discomfort although in general this does not appear to be as macerated and I think it is progressing nicely. I do think she still  needs to wear the foot portion of her Juxta- Lite in order to get the most benefit from the wrap obviously. She states she understands. Fortunately there does not appear to be evidence of infection at this time which is great news. 10/28/17 on evaluation today patient appears to be doing excellent in regard to her left lower extremity. She has just a couple areas that are still open and seem to be causing any trouble whatsoever. For that reason I think that she is definitely headed in the right direction the spots are very tiny compared to what we have been dealing with in the past. 11/11/17 on evaluation today patient appears to have a right lateral lower extremity ulcer that has opened since I last saw her. She states this is where the home health nurse that was coming out remove the dressing without wetting the alginate first. Nonetheless I do not know if this is indeed the case or not but more importantly we have not ordered home help to be coming out for her wounds at all. I'm unsure as to why they are coming out and we're gonna have to check on this and get things situated in that regard. With that being said we currently really do not need them to be coming out as the patient has been taking care of her leg herself without complication and no issues. In fact she was doing much better prior to nursing coming out. 11/25/17 on evaluation today patient actually appears to be doing fairly well in regard to her left lower extremity swelling. In fact she has very little area of weeping at this point there's just a small spot on the lateral portion of her right leg that still has me just a little bit more concerned as far as wanting to see this clear up before I discharge her to caring for this at home. Nonetheless overall she has made excellent progress. 12/09/17 on evaluation today patient appears to be doing rather well in regard to her lower extremity edema. She does have some weeping still in the left  lower extremity although the big area we were taking care of two  weeks ago actually has closed and she has another area of weeping on the left lower extremity immediately as well is the top of her foot. She does not currently have lymphedema pumps she has been wearing her compression daily on a regular basis as directed. With that being said I think she may benefit from lymphedema pumps. 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 We had a very difficult time getting and keeping this under control. 12/23/17 on evaluation today patient actually appears to be doing a little bit more poorly in regard to her bilateral lower extremities. She has been tolerating the Juxta-Lite compression wraps. Unfortunately she has two new ulcers on the right lower extremity and left lower Trinity ulceration seems to be larger. Obviously this is not good news. She has been tolerating the dressings without complication. 12/30/17 on evaluation today patient actually appears to be doing much better in regard to her bilateral lower extremity edema. She continues to have some issues with ulcerations and in fact there appears to be one spot on each leg where the wrap may have caused a little bit of a blister which is subsequently opened up at this point is given her pain. Fortunately it does not appear to be any evidence of infection which is good news. No fevers chills noted. 01/13/18 on evaluation today patient appears to be doing rather well in regard to her bilateral lower extremities. The dressings did get kind of stuck as far as the wound beds are concerned but again I think this is mainly due to the fact that she actually seems to be showing signs of healing which is good news. She's not having as much drainage therefore she was having more of the dressing sticking.  Nonetheless overall I feel like her swelling is dramatically down compared to previous. 01/20/18 on evaluation today patient unfortunately though she's doing better in most regards has a large blister on the left anterior lower extremity where she is draining quite significantly. Subsequently this is going to need debridement today in order to see what's underneath and ensure she does not continue to trapping fluid at this location. Nonetheless No fevers, chills, nausea, or vomiting noted at this time. 01/27/18 on evaluation today patient appears to be doing rather well at this point in regard to her right lower extremity there's just a very small area that she still has open at this point. With that being said I do believe that she is tolerating the compression wraps very well in making good progress. Home health is coming out at this point to see her. Her left lower extremity on the lateral portion is actually what still mainly open and causing her some discomfort for the most part 02/10/18 on evaluation today patient actually appears to be doing very well in regard to her right lower extremity were all the ulcers appear to be completely close. In regard to the left lower extremity she does have two areas still open and some leaking from the dorsal surface of her foot but this still seems to be doing much better to me in general. 02/24/18 on evaluation today patient actually appears to be doing much better in regard to her right lower extremity this is still completely healed. Her left lower extremity is also doing much better fortunately she has no evidence of infection. The one  area that is gonna require some debridement is still on the left anterior shin. Fortunately this is not hurting her as badly today. 03/10/18 on evaluation today patient appears to be doing better in some regards although she has a little bit more open area on the dorsal foot and she also has some issues on the medial portion of  the left lower extremity which is actually new and somewhat deep. With that being said there fortunately does not appear to be any significant signs of infection which is good news. No fevers, chills, nausea, or vomiting noted at this time. In general her swelling seems to be doing fairly well which is good news. 03/31/18 on evaluation today patient presents for follow-up concerning her left lower extremity lymphedema. Unfortunately she has been doing a little bit more poorly since I last saw her in regard to the amount of weeping that she is experiencing. She's also having some increased pain in the anterior shin location. Unfortunately I do not feel like the patient is making such good progress at this point a few weeks back she was definitely doing much better. 04/07/18 on evaluation today patient actually appears to be showing some signs of improvement as far as the left lower extremity is concerned. She has been tolerating the dressing changes and it does appear that the Drawtex did better for her. With that being said unfortunately home health is stating that they cannot obtain the Drawtex going forward. Nonetheless we're gonna have to check and see what they may be able to get the alginate they were using was getting stuck in causing new areas of skin being pulled all that with and subsequently weep and calls her to worsen overall this is the first time we've seen improvement at this time. 04/14/18 on evaluation today patient actually appears to be doing rather well at this point there does not appear to be any evidence of infection at this time and she is actually doing excellent in regard to the weeping in fact she almost has no openings remaining even compared to just last week this is a dramatic improvement. No fevers chills noted 04/21/18 evaluation today patient actually appears to be doing very well. She in fact is has a small area on the posterior lower extremity location and she has  a small area on the dorsal surface of her foot that are still open both of which are very close to closing. We're hoping this will be close shortly. She brought her Juxta-Lite wrap with her today hoping that would be able to put her in it unfortunately I don't think were quite at that point yet but we're getting closer. 04/28/18 upon evaluation today patient actually appears to be doing excellent in regard to her left lower extremity ulcer. In fact the region on the posterior lower extremity actually is much smaller than previously noted. Overall I'm very happy with the progress she has made. She again did bring her Juxta-Lite although we're not quite ready for that yet. 05/11/18 upon evaluation today patient actually appears to be doing in general fairly well in regard to her left lower Trinity. The swelling is very well controlled. With that being said she has a new area on the left anterior lower extremity as well as between the first and second toes of her left foot that was not present during the last evaluation. The region of her posterior left lower extremity actually appears to be almost completely healed. T be honest I'm very pleased  with o the way that stands. Nonetheless I do believe that the lotion may be keeping the area to moist as far as her legs are concerned subsequently I'm gonna consider discontinuing that today. 05/26/18 on evaluation today patient appears to be doing rather well in regard to her left lower should be ulcers. In fact everything appears to be close except for a very small area on the left posterior lower extremity. Fortunately there does not appear to be any evidence of infection at this time. Overall very pleased with her progress. 06/02/18 and evaluation today patient actually appears to be doing very well in regard to her lower extremity ulcers. She has one small area that still continues to weep that I think may benefit her being able to justify lotion and user  Juxta-Lite wraps versus continued to wrap her. Nonetheless I think this is something we can definitely look into at this point. 06/23/18 on evaluation today patient unfortunately has openings of her bilateral lower extremities. In general she seems to be doing much worse than when I last saw her just as far as her overall health standpoint is concerned. She states that her discomfort is mainly due to neuropathy she's not having any other issues otherwise. No fevers, chills, nausea, or vomiting noted at this time. 06/30/18 on evaluation today patient actually appears to be doing a little worse in regard to her right lower extremity her left lower extremity of doing fairly well. Fortunately there is no sign of infection at this time. She has been tolerating the dressing changes without complication. Home health did not come out like they were supposed to for the appropriate wrap changes. They stated that they never received the orders from [place] which were fax. Nonetheless we will send a copy of the orders with the patient today as well. 07/07/18 on evaluation today patient appears to be doing much better in regard to lower extremities. She still has several openings bilaterally although since I last saw her her legs did show obvious signs of infection when she later saw her nurse. Subsequently a culture was obtained and she is been placed on Bactrim and Keflex. Fortunately things seem to be looking much better it does appear she likely had an infection. Again last week we'd even discussed it but again there really was not any obvious sign that she had infection therefore we held off on the antibiotics. Nonetheless I'm glad she's doing better today. 07/14/18 on evaluation today patient appears to be doing much better regarding her bilateral lower Trinity's. In fact on the right lower for me there's nothing open at this point there are some dry skin areas at the sites where she had infection. Fortunately there is  no evidence of systemic infection which is excellent news. No fevers chills noted 07/21/18 on evaluation today patient actually appears to be doing much better in regard to her left lower extremity ulcers. She is making good progress and overall I feel like she's improving each time I see her. She's having no pain I do feel like the infection is completely resolved which is excellent news. No fevers, chills, nausea, or vomiting noted at this time. 07/28/18 on evaluation today patient appears to be doing very well in regard to her left lower [employer]. Everything seems to be showing signs of improvement which is excellent news. Overall very pleased with the progress that has been made. Fortunately there's no evidence of active infection at this time also excellent news. 08/04/18 on evaluation  today patient appears to be doing more poorly in regard to her bilateral lower extremities. She has two new areas open up on the right and these were completely closed as of last week. She still has the two spots on the left which in my pinion seem to be doing better. Fortunately there's no evidence of infection again at this point. 08/11/18 on evaluation today patient actually appears to be doing very well in regard to her bilateral lower Trinity wounds that all seem to be doing better and are measures smaller today. Fortunately there's no signs of infection. No fevers, chills, nausea, or vomiting noted at this time. 08/18/18 on evaluation today patient actually appears to be doing about the same inverter bilateral lower extremities. She continues to have areas that blistering open as was drain that fortunately nothing too significant. Overall I feel like Drawtex may have done better for her however compared to the collagen. 08/25/18 on evaluation today patient appears to be doing a little bit more poorly today even compared to last time I saw her. Again I'm not exactly sure why she's making worse progress over  the past several weeks. I'm beginning to wonder if there is some kind of underlying low level infection causing this issue. I did actually take a culture from the left anterior lower extremity but it was a new wound draining quite a bit at this point. Unfortunately she also seems to be having more pain which is what also makes me worried about the possibility of infection. This is despite never erythema noted at this point. 09/01/18 on evaluation today patient actually appears to be doing a little worse even compared to last week in regard to bilateral lower extremities. She did go to the hospital on the 16th was given a dose of IV Zosyn and then discharged with a recommendation to continue with the Bactrim that I previously prescribed for her. Nonetheless she is still having a lot of discomfort she tells me as well at this time. This is definitely unfortunate. No fevers, chills, nausea, or vomiting noted at this time. 09/08/18 on evaluation today patient's bilateral lower extremities actually appear to be shown signs of improvement which is good news. Fortunately there does not appear to be any signs of active infection I think the anabiotic is helping in this regard. Overall I'm very pleased with how she is progressing. 09/15/18 patient was actually seen in ER yesterday due to her legs as well unfortunately. She states she's been having a lot of pain and discomfort as well as a lot of drainage. Upon inspection today the patient does have a lot of swelling and drainage I feel like this is more related to lymphedema and poor fluid control than it is to infection based on what I'm seeing. The physician in the emergency department also doubted that the patient was having a significant infection nonetheless I see no evidence of infection obvious at this point although I do see evidence of poor fluid control. She still not using a compression pumps, she is not elevating due to her lift chair as well as her  hospital bed being broken, and she really is not keeping her legs up as much as they should be and also has been taking off her wraps. All this combined I think has led to poor fluid control and to be honest she may be somewhat volume overloaded in general as well. I recommend that she may need to contact your physician to see if a  prescription for a diuretic would be beneficial in their opinion. As long as this is safe I think it would likely help her. 09/29/18 on evaluation today patient's left lower extremity actually appears to be doing quite a bit better. At least compared to last time that I saw her. She still has a large area where she is draining from but there's a lot of new skin speckled trout and in fact there's more new skin that there are open areas of weeping and drainage at this point. This is good news. With regard to the right lower extremity this is doing much better with the only open area that I really see being a dry spot on the right lateral ankle currently. Fortunately there's no signs of active infection at this time which is good news. No fevers, chills, nausea, or vomiting noted at this time. The patient seems somewhat stressed and overwhelmed during the visit today she was very lethargic as such. She does and she is not taking any pain medications at this point. Apparently according to her husband are also in the process of moving which is probably taking its toll on her as well. 10/06/18 on evaluation today patient appears to be doing rather well in regard to her lower extremities compared to last evaluation. Fortunately there's no signs of active infection. She tells me she did have an appointment with her primary. Nonetheless he was concerned that the wounds were somewhat deep based on pictures but we never actually saw her legs. She states that he had her somewhat worried due to the fact that she was fearing now that she was [place] have to have an amputation. With that being  said based on what I'm seeing check she looks better this week that she has the last two times I've seen her with much less drainage I'm actually pleased in this regard. That doesn't mean that she's out of the water but again I do not think what the point of talking about education at all in regard to her leg. She is very happy to hear this. She is also not having as much pain as she was having last week. 10/13/18 unfortunately on evaluation today patient still continues to have a significant amount of drainage she's not letting home health actually apply the compression dressings at this point. She's trying to use of Juxta-Lite of the top of Kerlex and the second layer of the three layer compression wrap. With that being said she just does not seem to be making as good a progress as I would expect if she was having the compression applied and in place on a regular basis. No fevers, chills, nausea, or vomiting noted at this time. 10/20/18 on evaluation today patient appears to be doing a little better in regard to her bilateral lower extremity ulcers. In fact the right lower extremity seems to be healed she doesn't even have any openings at this point left lower extremity though still somewhat macerated seems to be showing signs of new skin growth at multiple locations throughout. Fortunately there's no evidence of active infection at this time. No fevers, chills, nausea, or vomiting noted at this time. 10/27/18 on evaluation today patient appears to be doing much better in regard to her left lower Trinity ulcer. She's been tolerating the laptop complication and has minimal drainage noted at this point. Fortunately there's no signs of active infection at this time. No fevers, chills, nausea, or vomiting noted at this time. 11/03/18 on evaluation today  patient actually appears to be doing excellent in regard to her left lower extremity. She is having very little drainage at this point there does not appear  to be any significant signs of infection overall very pleased with how things have gone. She is likewise extremely pleased still and seems to be making wonderful progress week to week. I do believe antibiotics were helpful for her. Her primary care provider did place on amateur clean since I last saw her. 11/17/18 on evaluation today patient appears to be doing worse in regard to her bilateral lower extremities at this point. She is been tolerating the dressing changes without complication. With that being said she typically takes the Coban off fairly quickly upon arriving home even after being seen here in the clinic and does not allow home health reapply command as part of the dressing at home. Therefore she said no compression essentially since I last saw her as best I can tell. With that being said I think it shows and how much swelling she has in the open wounds that are noted at this point. Fortunately there's no signs of infection but unfortunately if she doesn't get this under control I think she will end up with infection and more significant issues. 11/24/18 on evaluation today patient actually appears to be doing somewhat better in regard to her bilateral lower extremities. 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 Nonetheless that is doing better currently so she should be able to attempt reinitiating the lymphedema pumps at this time. No fevers, chills, nausea, or vomiting noted at this time. 12/01/18 upon evaluation today patient's left lower extremity appears to be doing quite well unfortunately her right lower extremity is not doing nearly as well. She has been tolerating the dressing changes without complication unfortunately she did not keep a wrap on the right at this time. Nonetheless I believe this has led to increased swelling and weeping in  the world is actually much larger than during the last evaluation with her. 12/08/18 on evaluation today patient appears to be doing about the same at this point in regard to her right lower extremity. There is some more palatable to touch I'm concerned about the possibility of there being some infection although I think the main issue is she's not keeping her compression wrap on which in turn is not allowing this area to heal appropriately. 12/22/18 on evaluation today patient appears to be doing better in regard to left lower extremity unfortunately significantly worse in regard to the right lower extremity. The areas of blistering and necrotic superficial tissue have spread and again this does not really appear to be signs of infection and all she just doesn't seem to be doing nearly as well is what she has been in the past. Overall I feel like the Augmentin did absolutely nothing for her she doesn't seem to have any infection again I really didn't think so last time either is more of a potential preventative measure and hoping that this would make some difference but I think the main issue is she's not wearing her compression. She tells me she cannot wear the [place] been we put on she takes it off pretty much upon getting home. Subsequently she worshiped Juxta-Lite when I questioned her about how often she wears it this is no  more than three hours a day obviously that leaves 21 hours that she has no compression and this is obviously not doing well for her. Overall I'm concerned that if things continue to worsen she is at great risk of both infection as well as losing her leg. 01/05/19 on evaluation today patient appears to be doing well in regard to her left lower extremity which he is allowing [place] to wrap and not so well with regard to her right lower extremity which she is not allowing [place] to really wrap and keep the wrap on. She states that it hurts too badly whenever it's wrapped and she ends up  having to take it off. She's been using the Juxta-Lite she tells me up to six hours a day although I question whether or not that's really been the case to be honest. Previously she told me three hours today nonetheless obviously the legs as long as the wrap is doing great when she is not is doing much more poorly. 01/12/2019 on evaluation today patient actually appears to be doing a little better in my opinion with regard to her right lower extremity ulcer. She has a small open area on the left lower extremity unfortunately but again this I think is part of the normal fluctuation of what she is going to have to expect with regard to her legs especially when she is not using her lymphedema pumps on a regular basis. Subsequently based on what I am seeing today I think that she does seem to be doing slightly better with regard to her right lower extremity she did see her primary care provider on Monday they felt she had an infection and placed her on 2 antibiotics. Both Cipro and clindamycin. Subsequently again she seems possibly to be doing a little bit better in regards to the right lower extremity she also tells me however she has been wearing the compression wrap over the past week since I spoke with her as well that is a Kerlix and Coban wrap on the right. No fevers, chills, nausea, vomiting, or diarrhea. 01/19/2019 on evaluation today patient appears to be doing better with regard to her bilateral lower extremities especially the right. I feel like the compression has been beneficial for her which is great news. She did get a call from her primary care provider on her way here today telling her that she did have methicillin- resistant Staphylococcus aureus and he was calling in a couple new antibiotics for her including a ointment to be applied she tells me 3 times a day. With that being said this sounds like likely to be Bactroban which I think could be applied with each dressing/wrap change but I  would not be able to accommodate her applying this 3 times a day. She is in agreement with the least doing this we will add that to her orders today. 01/26/2019 on evaluation today patient actually appears to be doing much better with regard to her right lower extremity. Her left lower extremity is also doing quite well all things considering. Fortunately there is no evidence of active infection at this time. No fevers, chills, nausea, vomiting, or diarrhea. 02/02/2019 on evaluation today patient appears to be doing much better compared to her last evaluation. Little by little off like her right leg is returning more towards normal. There does not appear to be any signs of active infection and overall she seems to be doing quite well which is great news. I am very pleased  in this regard. No fevers, chills, nausea, vomiting, or diarrhea. 02/09/2019 upon evaluation today patient appears to be doing better with regard to her bilateral lower extremities. She has been tolerating the dressing changes without complication. Fortunately there is no signs of active infection at this time. No fevers, chills, nausea, vomiting, or diarrhea. 02/23/2019 on evaluation today patient actually appears to be doing quite well with regard to her bilateral lower extremities. She has been tolerating the dressing changes without complication. She is even used her pumps one time and states that she really felt like it felt good. With that being said she seems to be in good spirits and her legs appear to be doing excellent. 03/09/2019 on evaluation today patient appears to be doing well with regard to her right lower extremity there are no open wounds at this time she is having some discomfort but I feel like this is more neuropathy than anything. With regard to her left lower extremity she had several areas scattered around that she does have some weeping and drainage from but again overall she does not appear to be having any  significant issues and no evidence of infection at this time which is good news. 03/23/2019 on evaluation today patient appears to be doing well with regard to her right lower extremity which she tells me is still close she is using her juxta light here. Her left lower extremity she mainly just has an area on the foot which is still slightly draining although this also is doing great. Overall very pleased at this time. 04/06/2019 patient appears to be doing a little bit worse in regard to her left lower extremity upon evaluation today. She feels like this could be becoming infected again which she had issues with previous. Fortunately there is no signs of systemic infection but again this is always a struggle with her with her legs she will go from doing well to not so well in a very short amount of time. 04/20/2019 on evaluation today patient actually appears to be doing quite well with regard to her right lower extremity I do not see any signs of active infection at this time. Fortunately there is no fever chills noted. She is still taking the antibiotics which I prescribed for her at this point. In regard to the left lower extremity I do feel like some of these areas are better although again she still is having weeping from several locations at this time. 04/27/2019 on evaluation today patient appears to be doing about the same if not slightly worse in regard to her left lower extremity ulcers. She tells me when questioned that she has been sleeping in her Hoveround chair in fact she tells me she falls asleep without even knowing it. I think she is spending a whole lot of time in the chair and less time walking and moving around which is not good for her legs either. On top of that she is in a seated position which is also the worst position she is not really elevating her legs and she is also not using her lymphedema pumps. All this is good to contribute to worsening of her condition in  general. 05/18/2019 on evaluation today patient appears to be doing well with regard to her lower extremity on the right in fact this is showing no signs of any open wounds at this time. On the left she is continuing to have issues with areas that do drain. Some of the regions have healed and  there are couple areas that have reopened. She did go to the ER per the patient according to recommendations from the home health nurse due to what she was seen when she came out on 05/13/2019. Subsequently she felt like the patient needed to go to the hospital due to the fact that again she was having "milky white discharge" from her leg. Nonetheless she had and then was placed on doxycycline and subsequently seems to be doing better. 06/01/2019 upon evaluation today patient appears to be doing really in my opinion about the same. I do not see any signs of active infection which is good news. Overall she still has wounds over the bilateral lower extremities she has reopened on the right but this appears to be more of a crack where there is weeping/edema coming from the region. I do not see any evidence of infection at either site based on what I visualized today. 07/13/2019 upon evaluation today patient appears to be doing a little worse compared to last time I saw her. She since has been in the hospital from 06/21/2019 through 06/29/2019. This was secondary to having Covid. During that time they did apply lotion to her legs which unfortunately has caused her to develop a myriad of open wounds on her lower extremities. Her legs do appear to be doing better as far as the overall appearance is concerned but nonetheless she does have more open and weeping areas. 07/27/2019 upon evaluation today patient appears to be doing more poorly to be honest in regard to her left lower extremity in particular. There is no signs of systemic infection although I do believe she may have local infection. She notes she has been having  a lot of blue/green drainage which is consistent potentially with Pseudomonas. That may be something that we need to consider here as well. The doxycycline does not seem to have been helping. 08/03/2019 upon evaluation today patient appears to be doing a little better in my opinion compared to last week's evaluation. Her culture I did review today and she is on appropriate medications to help treat the Enterobacter that was noted. Overall I feel like that is good news. With that being said she is unfortunately continuing to have a lot of drainage and though it is doing better I still think she has a long ways to go to get things dried up in general. Fortunately there is no signs of systemic infection. 08/10/2019 upon evaluation today patient appears to be doing may be slightly better in regard to her left lower extremity the right lower extremity is doing much better. Fortunately there is no signs of infection right now which is good news. No fevers, chills, nausea, vomiting, or diarrhea. 08/24/2019 on evaluation today patient appears to be doing slightly better in regard to her lower extremities. The left lower extremity seems to be healed the right lower extremity is doing better though not completely healed as far as the openings are concerned. She has some generalized issues here with edema and weeping secondary to her lymphedema though again I do believe this is little bit drier compared to prior weeks evaluations. In general I am very pleased with how things seem to be progressing. No fevers, chills, nausea, vomiting, or diarrhea. 08/31/2019 upon evaluation today patient actually seems to making some progress here with regard to the left lower extremity in particular. She has been tolerating the dressing changes without complication. Fortunately there is no signs of active infection at this time. No fevers,  chills, nausea, vomiting, or diarrhea. She did see Dr. Doren Custard and he did note that she did have  a issue with the left great saphenous vein and the small saphenous vein in the leg. With that being said he was concerned about the possibility of laser ablation not being extremely successful. He also mentioned a small risk of DVT associated with the procedure. However if the wounds do not continue to improve he stated that that would probably be the way to go. Fortunately the patient's legs do seem to be doing much better. 09/07/2019 upon evaluation today patient appears to be doing better with regard to her lower extremities. She has been tolerating the dressing changes without complication. With that being said she is showing signs of improvement and overall very pleased. There are some areas on her leg that I think we do need to debride we discussed this last week the patient is in agreement with doing that as long as it does not hurt too badly. 09/14/2019 upon evaluation today patient appears to be doing decently well with regard to her left lower extremity. She is not having near as much weeping as she has had in the past things seem to be drying up which is good news. There is no signs of active infection at this time. 09/21/19 upon evaluation today patient appears to be doing better in regard overall to her bilateral lower extremities. She again has less open than she did previous and each week I feel like this is getting better. Fortunately there is no signs of active infection at this time. No fevers, chills, nausea, vomiting, or diarrhea. 09/28/2019 upon evaluation today patient actually appears to be showing signs of improvement with regard to her left lower extremity. Unfortunately the right medial lower extremity around the ankle region has reopened to some degree but this appears to be minimal still which is good news. There is no signs of active infection at this time which is also good news. 10/12/2019 upon evaluation today patient appears to be doing okay with regard to her bilateral  lower extremities today. The right is a little bit worse then last evaluation 2 weeks ago. The left is actually doing a little better in my opinion. Overall there is no signs of active infection at this time that I see. Obviously that something we have to keep a close eye on she is very prone to this with the significant and multiple openings that she has over the bilateral lower extremities. 10/19/2019 upon evaluation today patient appears to be doing about the best that I have seen her in quite some time. She has been tolerating the dressing changes without complication. There does not appear to be any signs of active infection and overall I am extremely happy with the way her legs appeared. She is drying up quite nicely and overall is having less pain. 11/09/2019 upon evaluation today patient appears to be doing better in regard to her wounds. She seems to be drying up more and more each time I see her this is just taking a very long time. Fortunately there is no signs of active infection at this time. 11/23/2019 upon evaluation today patient actually appears to be doing excellent in regard to her lower extremities at this point compared to where she has been. Fortunately there is no signs of active infection at this time. She did go to the hospital last week for nausea and vomiting completely unrelated to her wounds. Fortunately she is doing better  she was given some Reglan and got better. She had associated abdominal pain but they never found out what was going on. 12/07/2019 upon evaluation today patient appears to be doing well for the most part in regard to her legs. She unfortunately has not been keeping the [place] portion of her wraps on therefore the compression has not really been sufficient for what it is supposed to be. Nonetheless she tells me that it just hurt too bad therefore she removed it. 12/21/2019 upon evaluation today patient actually appears to be doing quite well with regard to her  legs. I do feel like she has been making progress which is great news and overall there is no signs of active infection at this time. No fevers, chills, nausea, vomiting, or diarrhea. 01/04/2020 upon evaluation today patient presents for follow-up concerning her lower extremity edema bilaterally. She still has open wounds she has not been using her lymphedema pumps. She is also not been utilizing her compression wraps appropriately she tends to unwrap them, take them off, or states that they hurt. Obviously the reason they hurt is because her legs start to swell but the issue is if she would use her compression/lymphedema pumps regularly she would not swell and she would have the pain. Nonetheless she has not even picked them up once honestly over the past several months and may be even as much as in the past year based on my opinion and what have seen. She tells me today that after last week when I talked about this with her specifically actually that was 2 weeks ago that she "forgot". 8//21 on evaluation today patient appears to be doing a little better in regard to her legs bilaterally. Fortunately there is no signs of active infection at this time. She tells me that she used her lymphedema pumps all of one time over the past 2 weeks since I last saw her. She tells me that she has been too busy in order to continue to use these. 02/01/2020 on evaluation today patient appears to be doing some better in regard to her wounds in general in her legs. We felt the right was healed although is not completely it does appear to be doing better she tells me she has been using her lymphedema pumps that she has had this six times since I last saw her. Obviously the more she does that the better she would do my opinion 02/15/2020 upon evaluation today patient appears to be doing about the same in regard to her legs. She tells me that she is pumping I'm still not sure how much she does to be perfectly honest. However  even if she does a little bit here and there I guess that is better than nothing. Fortunately there is no sign of active infection at this time which is great news. No fevers, chills, nausea, vomiting, or diarrhea. 02/29/2020 on evaluation today patient actually appears to be doing quite well all things considered this week. She has been tolerating the dressing changes without complication. Fortunately there is no signs of active infection at this time. No fevers, chills, nausea, vomiting, or diarrhea. 03/14/2020 upon evaluation today patient appears to be doing really about the same in regard to her legs. There is no signs of improvement overall and she as noted from home health does not appear to be elevating her legs he can get into her lift chair. There is too much stuff piled up on it the patient tells me. She also  tells me she cannot really use her pumps effectively due to the fact that she cannot have any space to get them on. Finally she is also not really elevating her legs because she is not sleeping in her bed she is sleeping in her chair currently and again overall I think everything that she is done in combination has been exactly the wrong thing for what she needs for her legs. 04/04/2020 upon evaluation today patient appears to be doing well at this time with regard to her legs. She is actually been pumping, keeping her wraps on, and to be honest she seems to be doing dramatically better the right leg is excellent the left leg is also excellent and measuring much smaller than previous. 04/18/2020 upon evaluation today patient actually is continue to make good progress in regard to her lower extremities bilaterally. Everything is improving and less wet that has been in the past overall I am extremely pleased with where things stand and I think that she is making great progress. The patient tells me she still continue to use her compression pumps 05/02/2020 on evaluation today patient appears  to be doing well at this time in regard to her left leg which is showing signs of drying up. With that being said she does have a lot of lymphedema type crusty skin around the toes of her left foot and the right medial ankle which has opened at this point. Fortunately there is no signs of active infection systemically at this point or even locally for that matter. 05/23/2020 on evaluation today patient appears to be doing well with regard to her lower extremities. Fortunately there is no signs of active infection at this time. No fever chills noted. She has been very depressed however she tells me. 06/06/2020 patient came in today for evaluation in regard to her bilateral lower extremity ulcerations. With that being said she came in feeling okay and actually laughing and joking around with the staff checking her in. Subsequently however she had a coughing spell and following the coughing spell it was a dramatic conversion from being jovial and joking around to being extremely short of breath her vital signs actually dropped in regard to her blood pressure from around 175 to down around 433 for systolic and from around 96 diastolic down to around 70. With that being said she also accompanied this with an increase in her respiratory rate which was also quite significant. Nonetheless I actually upon going into see her was extremely worried we called EMS to have her transported to the ER for further evaluation and treatment. She continued until EMS got here to be extremely short of breath even on 6 L of oxygen. Her oxygen saturation did come up to 99% but overall it was only 95 before which was not terrible she did feel like the oxygen helped her feel somewhat better however. She has a history of asthma but again even the coughing spell really should not have done this degree of alteration in her demeanor from where she was just before to after the coughing spell. She tells [place] however she has been feeling  somewhat abnormal since Friday although she could not really pinpoint exactly what was going on. 06/27/2020 plan evaluation today patient appears to be doing okay in regard to her leg ulcers. She is really not showing a lot of improvement to be honest and she still has a lot of weeping. With that being said after I last saw her we called  EMS to take her to the hospital she actually did not go she went to see her primary care provider who according to the patient have not identified and read through the note states that everything checked out okay. Nonetheless the patient has continued to have bouts where she gets very short of breath for seemingly no reason whatsoever. That happened even the same night that I saw her last time. This was after she refused transport to the hospital. With that being said she also tells me that just getting dressed to come here is quite a chore which is putting on her shirt causing her to have to stop to catch her breath. Obviously this is not normal and I feel like there is something going on. She may need a referral to be seen by cardiology ASAP. She does see her primary care provider early next week she tells me 07/11/2020 upon evaluation today patient's wounds again appear to be doing about the same she mainly has weeping of the bilateral lower extremities both are very swollen today she tells me she has been using her pumps but last time I saw her she told me she did not even have her pumps out that she had "packed them away because they had too much stuff in their house. Apparently her husband has been bringing stuff in from storage units. She then subsequently told me that she had so much stuff in her house that she has been staying up all night and did not go to bed till 7:00 this morning because she was cleaning up stuff. Nonetheless I am still unsure as to whether or not she is using the pumps based on what I am seeing I would think probably not. Nonetheless she  has been keeping the compression wraps in place that is at least something 07/25/2020 upon evaluation today patient appears to be doing well currently in regard to her legs all things considered. I do not think she is doing any worse significantly although honestly I do not think she is doing a lot better either. There does not appear to be any signs of infection which is good news although she does have a lot of weeping. She is using her pumps she tells me sporadically though she says that her husband is not a big fan of helping her to get the [place]. She also tells me is difficult because in their house. It would be easiest for her to use these as where her son is actually staying with her and living out at this point. Obviously I understand that she is having a lot of issues here and to be honest I do not really know what to say in that regard and the fact that I really cannot change the situations but I do believe she really needs to be using the lymphedema pumps to try to keep things under control here. Outside of that I think she is going to continue to have significant issues. I think the pumps coupled with good compression and elevation are the only things that are to help her at this point. 08/08/2020 upon evaluation today patient appears to be doing a little worse in regard to her legs in general. I think this may be due to some infection currently. With that being said I am going to go ahead and likely see about putting her on an antibiotic. Were also can obtain a culture today where she had some purulent drainage. 08/29/2020 upon evaluation today patient appears to  be doing about the same in regard to her bilateral lower extremities. Unfortunately she is continuing to have significant issues here with edema and she has significant lymphedema. With that being said she is really not doing anything that she is supposed to be doing as far as elevation, compression, using lymphedema pumps or  anything really for that matter. She does not use her pumps, is taken the wraps off shortly after having them put on as far as home health is concerned at least by the next day she tells me, and overall is not really elevating her legs she is telling me she has a lot to do as far as cleaning up in her house. Nonetheless this triad of noncompliance is leading to worsening not improving in regard to her legs. I had a very strong conversation with her about this today again. 09/12/2020 on evaluation today patient appears to be doing poorly with regard to her lower extremities bilaterally. Fortunately there does not appear to be any signs of active infection at this point. That is systemically. Locally there is some signs of pus coming from an area on her left leg. She has been on antibiotics recently though she is done with those currently. Nonetheless she tells me she has been having increased pain with the right leg this is extremely swollen as well. Fortunately there does not appear to be significant infection of the right leg though I think her pain is really as result of the increased swelling she has been declining the wrap on the right leg and the wounds here are getting significantly worse week by week. This obviously is not will be want to see. I discussed with the patient however if she does not use the wraps, use her pumps, and elevate her legs that she is not to get better. She has been sleeping in her motorized Hoveround which does not even allow her to elevate her legs at all. She tells me that she is also constantly "cleaning up as her husband told her that he wants to move back to [place]." Unfortunately this has been the narrative for the past several months in fact that she tells me that she has been having to clean up mass that was moved in from storage units into their home and that she does not really sleep and she never really elevates her legs. I explained to the patient today  that if she is not can I do any of this that there is really nothing that I can do to help her. 09/26/20 upon evaluation today patient appears to be doing about the same in regard to her wounds. Fortunately there is no signs of active infection at this time. No fever chills noted. She has been tolerating the dressing changes without complication which is good news. With that being said I do think that she unfortunately is still having a lot of pain but I think this is due to the swelling that really is not controlled and as I discussed with her last time she needs to be using her lymphedema pumps there is still in the box she has not even attempted to use those whatsoever. I am also not even certain she is taking her fluid pills as she states she may need to have "get a refill" she was seen with her husband at the appointment today. 10/24/2020 upon evaluation today patient appears to be doing decently well in regard to her legs all things considered. She has been tolerating  the dressing changes without complication. Fortunately there is no signs of active infection at this time. No fevers, chills, nausea, vomiting, or diarrhea. With that being said the patient does appear to have some excessive thickened skin buildup currently that has been require some sharp debridement to clear this away. I am hopeful if we do this we will be able to get some of this area to dry out this otherwise trapping fluid underneath. Her son is present during the office visit today. She tells me she still been doing a lot as far as cleaning up and going through boxes she tells me she has been up for a long time already this morning. 5/25; patient presents for 2-week follow-up. She had 3 layer compression wrap placed with calcium alginate underneath. She reports tolerating the wraps well. She has home health that changes the wrap 2-3 times a week. She denies signs of infection. She has almost finished her course of antibiotics. She  uses her lymphedema pumps once weekly. 11/21/2020 upon evaluation today patient appears to be doing well with regard to her legs all things considered. Fortunately there is no signs of active infection at this time which is great news I do not see any evidence of infection and overall I think that she is definitely improved compared to where things did previous. Nonetheless I do think that she may benefit from removing some of the thicker skin on the legs. Again we have attempted this in the past to some degree but I think that we might be able to more effectively do this of a clear some way and then potentially use a urea cream with salicylic acid to try to help clear some of this away. The patient is in agreement with the plan. 12/05/2020 upon evaluation today patient appears to be doing well with regard to her wounds in general. I feel like that things are somewhat improved the skin looking a little better. With that being said she still has significant lymphedema. She is not been using her lymphedema pumps and not really been elevating her legs. She also did have some maggots noted on the left leg she tells me that the "gnats are terrible and get all over her foot." With that being said I am concerned about the fact that the patient still is quite swollen even with the compression wraps I really feel like she needs to be using her pumps but I cannot talk her into doing this. 12/19/2020 upon evaluation today patient appears to be doing about the same in regard to her legs. Fortunately I do not see any signs of infection unfortunately she really seems to be very nonchalant about the fact that she has not been using her lymphedema pumps. She tells me that "I just forget". With that being said this needs to be something that is a priority for her I cannot count the number of times that have had this discussion with her and yet nothing seems to change. With that being said I think that is very unlikely she  is ever really getting healed with regard to her wounds to be honest she does not seem to be motivated to do anything to try to help at this point despite everything that I have tried to do and recommend for her. Electronic Signature(s) Signed: 12/19/2020 1:25:47 PM By: [name] PA-C Entered By: [name] on 12/19/2020 13:25:47 -------------------------------------------------------------------------------- Physical Exam Details Patient Name: Date of Service: [name] 12/19/2020 11:00 A M  Medical Record Number: [id number] Patient Account Number: [id number] Date of Birth/Sex: Treating RN: [birth year] (70 y.o. [name] Primary Care Provider: [name] Other Clinician: Referring Provider: Treating Provider/Extender: Darlen Round in Treatment: 63 Constitutional Well-nourished and well-hydrated in no acute distress. Respiratory normal breathing without difficulty. Psychiatric this patient is able to make decisions and demonstrates good insight into disease process. Alert and Oriented x 3. pleasant and cooperative. Notes Upon inspection patient's wound bed actually showed signs of is a weeping over the bilateral lower extremities. Fortunately there does not appear to be any signs of infection currently which is good news but nonetheless she is really not making good progress and again this is in part due to the fact that she for various reasons ends up at times taken her wraps off this time it was because she states the left wrap got wet. This leg is significantly more swollen today. She also does not use her lymphedema pumps at all in fact she does not even appear to be trying to do this she tells me she forgets despite the fact that her sitting right in front of her in her house. Electronic Signature(s) Signed: 12/19/2020 1:27:03 PM By: [name] PA-C Entered By: [name] on 12/19/2020  13:27:03 -------------------------------------------------------------------------------- Physician Orders Details Patient Name: Date of Service: [name] 12/19/2020 11:00 A M Medical Record Number: [id number] Patient Account Number: [id number] Date of Birth/Sex: Treating RN: 1951-04-04 (70 y.o. [name] Primary Care Provider: [name] Other Clinician: Referring Provider: Treating Provider/Extender: Darlen Round in Treatment: [phone] Verbal / Phone Orders: No Diagnosis Coding ICD-10 Coding Code Description E11.622 Type 2 diabetes mellitus with other skin ulcer I89.0 Lymphedema, not elsewhere classified I87.331 Chronic venous hypertension (idiopathic) with ulcer and inflammation of right lower extremity I87.332 Chronic venous hypertension (idiopathic) with ulcer and inflammation of left lower extremity L97.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 leg with fat layer exposed L97.822 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 Bathing/ Shower/ Hygiene May shower and wash wound with soap and water. - with dressing changes, wash both legs with wash cloth and soap and water with dressing changes Other Bathing/Shower/Hygiene Orders/Instructions: - Home health to wash legs with warn soapy water and a clean wash cloth with dressing changes Edema Control - Lymphedema / SCD / Other Bilateral Lower Extremities Lymphedema Pumps. Use Lymphedema pumps on leg(s) 2-3 times a day for 45-60 minutes. If wearing any wraps or hose, do not remove them. Continue exercising as instructed. Elevate legs to the level of the heart or  above for 30 minutes daily and/or when sitting, a frequency of: - especially at night Avoid standing for long periods of time. Exercise regularly Home Health No change in wound care orders this week; continue Home Health for wound care. May utilize formulary equivalent dressing for wound treatment orders unless otherwise specified. - add urea cream to legs with dressing changes when available Dressing changes to be completed by Klein on Monday / Wednesday / Friday except when patient has scheduled visit at [facility]. Other Home Health Orders/Instructions: - [facility] Wound #61 - Lower Leg Wound Laterality: Left,  Circumferential Peri-Wound Care: Sween Lotion (Moisturizing lotion) (Home Health) 3 x Per Week/30 Days Discharge Instructions: Apply moisturizing lotion to dry skin on legs Peri-Wound Care: Urea Cream 40%, tube, 1 (oz) 3 x Per Week/30 Days Discharge Instructions: apply to lower legs with dressing changes Prim Dressing: Maxorb Extra Calcium Alginate 2x2 in (Home Health) 3 x Per Week/30 Days ary Discharge Instructions: Apply calcium alginate to wound bed as instructed Secondary Dressing: ABD Pad, 8x10 (Home Health) 3 x Per Week/30 Days Discharge Instructions: Apply over primary dressing as directed. Secondary Dressing: Zetuvit Plus 4x4 in (Home Health) 3 x Per Week/30 Days Discharge Instructions: or equivalent extra absorbent pad.Apply over primary dressing as directed. Compression Wrap: ThreePress (3 layer compression wrap) (Home Health) 3 x Per Week/30 Days Discharge Instructions: Apply three layer compression as directed. Pad bend of ankle with foam or ABD pad. Wound #64 - Foot Wound Laterality: Dorsal, Left Peri-Wound Care: Sween Lotion (Moisturizing lotion) (Home Health) 3 x Per Week/30 Days Discharge Instructions: Apply moisturizing lotion to dry skin on legs Peri-Wound Care: Urea Cream 40%, tube, 1 (oz) 3 x Per Week/30 Days Discharge  Instructions: apply to lower legs with dressing changes Prim Dressing: Maxorb Extra Calcium Alginate 2x2 in (Home Health) 3 x Per Week/30 Days ary Discharge Instructions: Apply calcium alginate to wound bed as instructed Secondary Dressing: ABD Pad, 8x10 (Home Health) 3 x Per Week/30 Days Discharge Instructions: Apply over primary dressing, also cover toes with ABD pad for closed dressing Secondary Dressing: Zetuvit Plus 4x4 in (Home Health) 3 x Per Week/30 Days Discharge Instructions: or equivalent extra absorbent pad.Apply over primary dressing as directed. Compression Wrap: ThreePress (3 layer compression wrap) (Home Health) 3 x Per Week/30 Days Discharge Instructions: Apply three layer compression as directed. Pad bend of ankle with foam or ABD pad. Wound #67 - Lower Leg Wound Laterality: Right, Medial Peri-Wound Care: Sween Lotion (Moisturizing lotion) (Home Health) 3 x Per Week/30 Days Discharge Instructions: Apply moisturizing lotion to dry skin on legs Peri-Wound Care: Urea Cream 40%, tube, 1 (oz) 3 x Per Week/30 Days Discharge Instructions: apply to lower legs with dressing changes Prim Dressing: Maxorb Extra Calcium Alginate 2x2 in (Home Health) 3 x Per Week/30 Days ary Discharge Instructions: Apply calcium alginate to wound bed as instructed Secondary Dressing: ABD Pad, 8x10 (Home Health) 3 x Per Week/30 Days Discharge Instructions: Apply over primary dressing, also cover toes with ABD pad for closed dressing Secondary Dressing: Zetuvit Plus 4x4 in (Home Health) 3 x Per Week/30 Days Discharge Instructions: or equivalent extra absorbent pad.Apply over primary dressing as directed. Compression Wrap: ThreePress (3 layer compression wrap) (Home Health) 3 x Per Week/30 Days Discharge Instructions: Apply three layer compression as directed. Pad bend of ankle with foam or ABD pad. Electronic Signature(s) Signed: 12/19/2020 4:43:01 PM By: [name] PA-C Signed: 12/19/2020 6:17:15 PM  By: [name] RN, BSN Entered By: [name] on 12/19/2020 12:51:49 -------------------------------------------------------------------------------- Problem List Details Patient Name: Date of Service: [name] 12/19/2020 11:00 A M Medical Record Number: [id number] Patient Account Number: [id number] Date of Birth/Sex: Treating RN: [birth year] (70 y.o. [name] Primary Care Provider: [name] Other Clinician: Referring Provider: Treating Provider/Extender: Darlen Round in Treatment: [phone] Active Problems ICD-10 Encounter Code Description Active Date MDM Diagnosis E11.622 Type 2 diabetes mellitus with other skin ulcer 08/05/2017 No Yes I89.0 Lymphedema, not elsewhere classified 08/05/2017 No Yes I87.331 Chronic venous hypertension (idiopathic) with ulcer and inflammation of right 08/05/2017 No  Yes lower extremity I87.332 Chronic venous hypertension (idiopathic) with ulcer and inflammation of left 08/05/2017 No Yes lower extremity L97.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 leg with fat layer 08/05/2017 No Yes exposed L97.822 Non-pressure chronic ulcer of other part of left lower leg with fat layer exposed2/20/2019 No Yes L97.522 Non-pressure chronic ulcer of other part of left foot with fat layer exposed 06/01/2019 No Yes I10 Essential (primary) hypertension 08/05/2017 No Yes E66.01 Morbid (severe) obesity due to excess calories 08/05/2017 No Yes F41.8 Other specified anxiety disorders 08/05/2017 No Yes R53.1 Weakness 08/05/2017 No Yes Inactive Problems Resolved Problems Electronic Signature(s) Signed: 12/19/2020 11:31:37 AM By: [name] PA-C Entered By: [name] on 12/19/2020 11:31:36 -------------------------------------------------------------------------------- Progress Note Details Patient Name: Date of Service: [name] 12/19/2020 11:00 A M Medical Record Number: [id number] Patient Account  Number: [id number] Date of Birth/Sex: Treating RN: [birth year] (70 y.o. [name] Primary Care Provider: [name] Other Clinician: Referring Provider: Treating Provider/Extender: Darlen Round in Treatment: 176 Subjective Chief Complaint Information obtained from Patient Bilateral reoccurring LE ulcers History of Present Illness (HPI) this patient has been seen a couple of times before and returns with recurrent problems to her right and left lower extremity with swelling and weeping ulcerations due to not wearing her compression stockings which she had been advised to do during her last discharge, at the end of June 2018. During her last visit the patient had had normal arterial blood flow and her venous reflux study did not necessitate any surgical intervention. She was recommended compression and elevation and wound care. After prolonged treatment the patient was completely healed but she has been noncompliant with wearing or compressions.. She was here last week with an outpatient return visit planned but the patient came in a very poor general condition with altered mental status and was rushed to the ER on my request. With a history of hypertension, diabetes, TIA and right-sided weakness she was set up for an MRI on her brain and cervical spine and was sent to [facility]. Getting an MRI done was very difficult but once the workup was done she was found not to have any spinal stenosis, epidural abscess or hematoma or discitis. This was radiculopathy to be treated as an outpatient and she was given a follow-up appointment. Today she is feeling much better alert and oriented and has come to reevaluate her bilateral lower extremity lymphedema and ulceration 03/25/2017 -- she was admitted to the hospital on 03/16/2017 and discharged on 03/18/2017 with left leg cellulitis and ulceration. She was started on vancomycin and Zosyn and x-ray showed no bony  involvement. She was treated for a cellulitis with IV antibiotics changed to Rocephin and Flagyl and was discharged on oral Keflex and doxycycline to complete a 7 day course. Last hemoglobin A1c was 7.1 and her other ailments including hypertension got asthma were appropriately treated. 05/06/2017 -- she is awaiting the right size of compression stockings from [place] but other than that has been doing well. ====== Old notes 70 year old patient was seen one time last October and was lost to follow-up. She has recurrent problems with weeping and ulceration of her left lower extremity and has swelling of this for several years. It has been worse for the last 2 months. Past medical history is significant for diabetes mellitus type 2, hypertension, gout, morbid obesity, depressive disorders, hiatal hernia, migraines, status post knee surgery, risk of a cholecystectomy, vaginal hysterectomy and  breast biopsy. She is not a smoker. As noted before she has never had a venous duplex study and an arterial ABI study was attempted but the left lower extremity was noncompressible 10/01/2016 -- had a lower extremity venous duplex reflux evaluation which showed no evidence of deep vein reflux in the right or left lower extremity, and no evidence of great saphenous vein reflux more than 500 ms in the right or left lower extremity, and the left small saphenous vein is incompetent but no vascular consult was recommended. review of her electronic medical records noted that the ABI was checked in July 2017 where the right ABI was normal limits and the left ABI could not be ascertained due to pain with cuff pressure but the waveforms are within normal limits. her arterial duplex study scheduled for April 27. 10/08/2016 -- the patient has various reasons for not having a compression on and for the last 3 days she has had no compression on her left lower extremity either due to pain or the lack of nursing help. She does  not use her juxta lites either. 10/15/2016 -- the patient did not keep her appointment for arterial duplex study on April 27 and I have asked her to reschedule this. Her pain is out of proportion with the physical findings and she continuously fails to wear a compression wraps and cuts them off because she says she cannot tolerate the pain. She does not use her juxta lites either. 10/22/2016 -- he has rescheduled her arterial duplex study to May 21 and her pain today is a bit better. She has not been wearing her juxta lites on her right lower extremity but now understands that she needs to do this. She did tolerate the to press compression wrap on her left lower extremity 10/29/2016 --arterial duplex study is scheduled for next week and overall she has been tolerating her compression wraps and also using her juxta lites on her right lower extremity 11/05/2016 -- the right ABI was 0.95 the left was 1.03. The digit TBI is on the right was 0.83 on the left was 0.92 and she had biphasic flow through these vessels. The impression was that of normal lower extremity arterial study. 11/12/2016 -- her pain is minimal and she is doing very well overall. 11/26/2016 -- she has got juxta lites and her insurance will not pay for additional dual layer compression stockings. She is going to order some from [place]. 05/12/2017 -- her juxta lites are very old and too big for her and these have not been helping with compression. She did get 20-30 mm compression stockings from [place] but she and her husband are unable to put these on. I believe she will benefit from bilateral Extremit-ease, compression stockings and we will measure her for these today. 05/20/2017 -- lymphedema on the left lower extremity has increased a lot and she has a open ulceration as a result of this. The right lower extremity is looking pretty good. She has decided to by the compression stockings herself and will get reimbursed by the home  health, at a later date. 05/27/2017 -- her sciatica is bothering her a lot and she thought her left leg pain was caused due to the compression wrap and hence removed it and has significant lymphedema. There is no inflammation on this left lower extremity. 06/17/17 on evaluation today patient appears to be doing very well and in fact is completely healed in regard to her ulcerations. Unfortunately however she does have continued issues  with lymphedema nonetheless. We did order compression garments for her unfortunately she states that the size that she received were large although we ordered medium. Obviously this means she is not getting the optimal compression. She does not have those with her today and therefore we could not confirm and contact the company on her behalf. Nonetheless she does state that she is going to have her husband bring them by tomorrow so that we can verify and then get in touch with the company. No fevers, chills, nausea, or vomiting noted at this time. Overall patient is doing better otherwise and I'm pleased with the progress she has made. 07/01/17 on evaluation today patient appears to be doing very well in regard to her bilateral lower extremity she does not have any openings at this point which is excellent news. Overall I'm pleased with how things have progressed up to this time. Since she is doing so well we did order her compression which we are seeing her today to ensure that it fits her properly and everything is doing well in that regard and then subsequently she will be discharged. ============ Old Notes: 03/31/16 patient presents today for evaluation concerning open wounds that she has over the left medial ankle region as well as the left dorsal foot. She has previously had this occur although it has been healed for a number of months after having this for about a year prior until her hospitalization on 01/05/16. At that point in time it appears that she was admitted  to the hospital for left lower extremity cellulitis and was placed on vancomycin and Zosyn at that point. Eventually upon discharge on January 15, 2016 she was placed on doxycycline at that point in time. Later on 03/27/16 positive wound culture growing Escherichia coli this was switched to amoxicillin. Currently she tells me that she is having pain radiated to be a 7 out of 10 which can be as high as 10 out of 10 with palpation and manipulation of the wound. This wound appears to be mainly venous in nature due to the bilateral lower extremity venous stasis/lymphedema. This is definitely much worse on her left than the right side. She does have type 1 diabetes mellitus, hypertension, morbid obesity, and is wheelchair dependent.during the course of the hospital stay a blood culture was also obtained and fortunately appeared negative. She also had an x-ray of the tibia/fibula on the left which showed no acute bone abnormality. Her white blood cell count which was performed last on 03/25/16 was 7.3, hemoglobin 12.8, protein 7.1, albumin 3.0. Her urine culture appeared to be negative for any specific organisms. Patient did have a left lower extremity venous duplex evaluation for DVT . This did not include venous reflux studies but fortunately was negative for DVT Patient also had arterial studies performed which revealed that she had a . normal ABI on the right though this was unable to be performed on the left secondary to pain that she was having around the ankle region due to the wound. However it was stated on report that she had biphasic pulses and apparently good blood flow. ========== 06/03/17 she is here in follow-up evaluation for right lower extremity ulcer. The right lower sure he has healed but she has reopened to the left medial malleolus and dorsal foot with weeping. She is waiting for new compression garments to arrive from home health, the previous compression garments were ill fitting.  We will continue with compression bilaterally and follow-up in 2 weeks  Readmission: 08/05/17 on evaluation today patient appears to be doing somewhat poorly in regard to her left lower extremity especially although the right lower extremity has a small area which may no longer be open. She has been having a lot of drainage from the left lower extremity however he tells me that she has not been able to use the EXTREMIT-EASE Compression at this point. She states that she did better and was able to actually apply the Juxta-Lite compression although the wound that she has is too large and therefore really does not compress which is why she cannot wear it at this point. She has no one who can help her put it on regular basis her son can sometimes but he's not able to do it most of the time. I do believe that's why she has begun to weave and have issues as she is currently yet again. No fevers, chills, nausea, or vomiting noted at this time. Patient is no evidence of dementia. 08/12/17 on evaluation today patient appears to still be doing fairly well in regard to the draining areas/weeping areas at this point. With that being said she unfortunately did go to the ER yesterday due to what was felt to be possibly a cellulitis. They place her on doxycycline by mouth and discharge her home. She definitely was not admitted. With that being said she states she has had more discomfort which has been unusual for her even compared to prior times and she's had infections.08/12/17 on evaluation today patient appears to still be doing fairly well in regard to the draining areas/weeping areas at this point. With that being said she unfortunately did go to the ER yesterday due to what was felt to be possibly a cellulitis. They place her on doxycycline by mouth and discharge her home. She definitely was not admitted. With that being said she states she has had more discomfort which has been unusual for her even compared to  prior times and she's had infections. 08/19/17 put evaluation today patient tells me that she's been having a lot of what sounds to be neuropathic type pain in regard to her left lower extremity. She has been using over-the-counter topical bins again which some believe. That in order to apply the she actually remove the wrap we put on her last Wednesday on Thursday. Subsequently she has not had anything on compression wise since that time. The good news is a lot of the weeping areas appear to have closed at this point again I believe she would do better with compression but we are struggling to get her to actually use what she needs to at this point. No fevers, chills, nausea, or vomiting noted at this time. 09/03/17 on evaluation today patient appears to be doing okay in regard to her lower extremities in regard to the lymphedema and weeping. Fortunately she does not seem to show any signs of infection at this point she does have a little bit of weeping occurring in the right medial malleolus area. With that being said this does not appear to be too significant which is good news. 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 She has severe skin damage secondary to both of these features involving the dorsal left foot and medial left ankle and lower leg. Still has open areas in the left anterior foot. The area on the right closed over. She uses her own juxta light stockings. She does not have an arterial issue  09/16/17 on evaluation today patient actually appears to be doing excellent in regard to her bilateral lower extremity swelling. The Juxta-Lite compression wrap seem to be doing very well for her. She has not however been using the portion that goes over her foot. Her left foot still is draining a little bit not nearly as significant as it has been in the past but still I do believe that she likely needs to utilize the full wrap  including the foot portion of this will improve as well. She also has been apparently putting on a significant amount of Vaseline which also think is not helpful for her. I recommended that if she feels she needs something for moisturizer Eucerin will probably be better. 09/30/17 on evaluation today patient presents with several new open areas in regard to her left lower extremity although these appear to be minimal and mainly seem to be more moisture breakdown than anything. Fortunately she does not seem to have any evidence of infection which is great news. She has been tolerating the dressing changes without complication we are using silver alginate on the foot she has been using AB pads to have the legs and using her Juxta- Lite compression which seems to be controlling her swelling very well. Overall I'm pleased with the poor way she has progressed. 10/14/17 on evaluation today patient appears to be doing better in regard to her left lower extremity areas of weeping. She does still have some discomfort although in general this does not appear to be as macerated and I think it is progressing nicely. I do think she still needs to wear the foot portion of her Juxta- Lite in order to get the most benefit from the wrap obviously. She states she understands. Fortunately there does not appear to be evidence of infection at this time which is great news. 10/28/17 on evaluation today patient appears to be doing excellent in regard to her left lower extremity. She has just a couple areas that are still open and seem to be causing any trouble whatsoever. For that reason I think that she is definitely headed in the right direction the spots are very tiny compared to what we have been dealing with in the past. 11/11/17 on evaluation today patient appears to have a right lateral lower extremity ulcer that has opened since I last saw her. She states this is where the home health nurse that was coming out remove the  dressing without wetting the alginate first. Nonetheless I do not know if this is indeed the case or not but more importantly we have not ordered home help to be coming out for her wounds at all. I'm unsure as to why they are coming out and we're gonna have to check on this and get things situated in that regard. With that being said we currently really do not need them to be coming out as the patient has been taking care of her leg herself without complication and no issues. In fact she was doing much better prior to nursing coming out. 11/25/17 on evaluation today patient actually appears to be doing fairly well in regard to her left lower extremity swelling. In fact she has very little area of weeping at this point there's just a small spot on the lateral portion of her right leg that still has me just a little bit more concerned as far as wanting to see this clear up before I discharge her to caring for this at home. Nonetheless overall  she has made excellent progress. 12/09/17 on evaluation today patient appears to be doing rather well in regard to her lower extremity edema. She does have some weeping still in the left lower extremity although the big area we were taking care of two weeks ago actually has closed and she has another area of weeping on the left lower extremity immediately as well is the top of her foot. She does not currently have lymphedema pumps she has been wearing her compression daily on a regular basis as directed. With that being said I think she may benefit from lymphedema pumps. 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 We had a very difficult time getting and keeping this under control. 12/23/17 on evaluation today patient actually appears to be doing a little bit more poorly in regard to her bilateral lower extremities. She  has been tolerating the Juxta-Lite compression wraps. Unfortunately she has two new ulcers on the right lower extremity and left lower [place] ulceration seems to be larger. Obviously this is not good news. She has been tolerating the dressings without complication. 12/30/17 on evaluation today patient actually appears to be doing much better in regard to her bilateral lower extremity edema. She continues to have some issues with ulcerations and in fact there appears to be one spot on each leg where the wrap may have caused a little bit of a blister which is subsequently opened up at this point is given her pain. Fortunately it does not appear to be any evidence of infection which is good news. No fevers chills noted. 01/13/18 on evaluation today patient appears to be doing rather well in regard to her bilateral lower extremities. The dressings did get kind of stuck as far as the wound beds are concerned but again I think this is mainly due to the fact that she actually seems to be showing signs of healing which is good news. She's not having as much drainage therefore she was having more of the dressing sticking. Nonetheless overall I feel like her swelling is dramatically down compared to previous. 01/20/18 on evaluation today patient unfortunately though she's doing better in most regards has a large blister on the left anterior lower extremity where she is draining quite significantly. Subsequently this is going to need debridement today in order to see what's underneath and ensure she does not continue to trapping fluid at this location. Nonetheless No fevers, chills, nausea, or vomiting noted at this time. 01/27/18 on evaluation today patient appears to be doing rather well at this point in regard to her right lower extremity there's just a very small area that she still has open at this point. With that being said I do believe that she is tolerating the compression wraps very well in making good  progress. Home health is coming out at this point to see her. Her left lower extremity on the lateral portion is actually what still mainly open and causing her some discomfort for the most part 02/10/18 on evaluation today patient actually appears to be doing very well in regard to her right lower extremity were all the ulcers appear to be completely close. In regard to the left lower extremity she does have two areas still open and some leaking from the dorsal surface of her foot but this still seems to be doing much better to  me in general. 02/24/18 on evaluation today patient actually appears to be doing much better in regard to her right lower extremity this is still completely healed. Her left lower extremity is also doing much better fortunately she has no evidence of infection. The one area that is gonna require some debridement is still on the left anterior shin. Fortunately this is not hurting her as badly today. 03/10/18 on evaluation today patient appears to be doing better in some regards although she has a little bit more open area on the dorsal foot and she also has some issues on the medial portion of the left lower extremity which is actually new and somewhat deep. With that being said there fortunately does not appear to be any significant signs of infection which is good news. No fevers, chills, nausea, or vomiting noted at this time. In general her swelling seems to be doing fairly well which is good news. 03/31/18 on evaluation today patient presents for follow-up concerning her left lower extremity lymphedema. Unfortunately she has been doing a little bit more poorly since I last saw her in regard to the amount of weeping that she is experiencing. She's also having some increased pain in the anterior shin location. Unfortunately I do not feel like the patient is making such good progress at this point a few weeks back she was definitely doing much better. 04/07/18 on evaluation  today patient actually appears to be showing some signs of improvement as far as the left lower extremity is concerned. She has been tolerating the dressing changes and it does appear that the Drawtex did better for her. With that being said unfortunately home health is stating that they cannot obtain the Drawtex going forward. Nonetheless we're gonna have to check and see what they may be able to get the alginate they were using was getting stuck in causing new areas of skin being pulled all that with and subsequently weep and calls her to worsen overall this is the first time we've seen improvement at this time. 04/14/18 on evaluation today patient actually appears to be doing rather well at this point there does not appear to be any evidence of infection at this time and she is actually doing excellent in regard to the weeping in fact she almost has no openings remaining even compared to just last week this is a dramatic improvement. No fevers chills noted 04/21/18 evaluation today patient actually appears to be doing very well. She in fact is has a small area on the posterior lower extremity location and she has a small area on the dorsal surface of her foot that are still open both of which are very close to closing. We're hoping this will be close shortly. She brought her Juxta-Lite wrap with her today hoping that would be able to put her in it unfortunately I don't think were quite at that point yet but we're getting closer. 04/28/18 upon evaluation today patient actually appears to be doing excellent in regard to her left lower extremity ulcer. In fact the region on the posterior lower extremity actually is much smaller than previously noted. Overall I'm very happy with the progress she has made. She again did bring her Juxta-Lite although we're not quite ready for that yet. 05/11/18 upon evaluation today patient actually appears to be doing in general fairly well in regard to her left lower  Trinity. The swelling is very well controlled. With that being said she has a new area on the left  anterior lower extremity as well as between the first and second toes of her left foot that was not present during the last evaluation. The region of her posterior left lower extremity actually appears to be almost completely healed. T be honest I'm very pleased with o the way that stands. Nonetheless I do believe that the lotion may be keeping the area to moist as far as her legs are concerned subsequently I'm gonna consider discontinuing that today. 05/26/18 on evaluation today patient appears to be doing rather well in regard to her left lower should be ulcers. In fact everything appears to be close except for a very small area on the left posterior lower extremity. Fortunately there does not appear to be any evidence of infection at this time. Overall very pleased with her progress. 06/02/18 and evaluation today patient actually appears to be doing very well in regard to her lower extremity ulcers. She has one small area that still continues to weep that I think may benefit her being able to justify lotion and user Juxta-Lite wraps versus continued to wrap her. Nonetheless I think this is something we can definitely look into at this point. 06/23/18 on evaluation today patient unfortunately has openings of her bilateral lower extremities. In general she seems to be doing much worse than when I last saw her just as far as her overall health standpoint is concerned. She states that her discomfort is mainly due to neuropathy she's not having any other issues otherwise. No fevers, chills, nausea, or vomiting noted at this time. 06/30/18 on evaluation today patient actually appears to be doing a little worse in regard to her right lower extremity her left lower extremity of doing fairly well. Fortunately there is no sign of infection at this time. She has been tolerating the dressing changes without  complication. Home health did not come out like they were supposed to for the appropriate wrap changes. They stated that they never received the orders from [place] which were fax. Nonetheless we will send a copy of the orders with the patient today as well. 07/07/18 on evaluation today patient appears to be doing much better in regard to lower extremities. She still has several openings bilaterally although since I last saw her her legs did show obvious signs of infection when she later saw her nurse. Subsequently a culture was obtained and she is been placed on Bactrim and Keflex. Fortunately things seem to be looking much better it does appear she likely had an infection. Again last week we'd even discussed it but again there really was not any obvious sign that she had infection therefore we held off on the antibiotics. Nonetheless I'm glad she's doing better today. 07/14/18 on evaluation today patient appears to be doing much better regarding her bilateral lower Trinity's. In fact on the right lower for me there's nothing open at this point there are some dry skin areas at the sites where she had infection. Fortunately there is no evidence of systemic infection which is excellent news. No fevers chills noted 07/21/18 on evaluation today patient actually appears to be doing much better in regard to her left lower extremity ulcers. She is making good progress and overall I feel like she's improving each time I see her. She's having no pain I do feel like the infection is completely resolved which is excellent news. No fevers, chills, nausea, or vomiting noted at this time. 07/28/18 on evaluation today patient appears to be doing very well in regard  to her left lower [employer]. Everything seems to be showing signs of improvement which is excellent news. Overall very pleased with the progress that has been made. Fortunately there's no evidence of active infection at this time also excellent  news. 08/04/18 on evaluation today patient appears to be doing more poorly in regard to her bilateral lower extremities. She has two new areas open up on the right and these were completely closed as of last week. She still has the two spots on the left which in my pinion seem to be doing better. Fortunately there's no evidence of infection again at this point. 08/11/18 on evaluation today patient actually appears to be doing very well in regard to her bilateral lower Trinity wounds that all seem to be doing better and are measures smaller today. Fortunately there's no signs of infection. No fevers, chills, nausea, or vomiting noted at this time. 08/18/18 on evaluation today patient actually appears to be doing about the same inverter bilateral lower extremities. She continues to have areas that blistering open as was drain that fortunately nothing too significant. Overall I feel like Drawtex may have done better for her however compared to the collagen. 08/25/18 on evaluation today patient appears to be doing a little bit more poorly today even compared to last time I saw her. Again I'm not exactly sure why she's making worse progress over the past several weeks. I'm beginning to wonder if there is some kind of underlying low level infection causing this issue. I did actually take a culture from the left anterior lower extremity but it was a new wound draining quite a bit at this point. Unfortunately she also seems to be having more pain which is what also makes me worried about the possibility of infection. This is despite never erythema noted at this point. 09/01/18 on evaluation today patient actually appears to be doing a little worse even compared to last week in regard to bilateral lower extremities. She did go to the hospital on the 16th was given a dose of IV Zosyn and then discharged with a recommendation to continue with the Bactrim that I previously prescribed for her. Nonetheless she is still  having a lot of discomfort she tells me as well at this time. This is definitely unfortunate. No fevers, chills, nausea, or vomiting noted at this time. 09/08/18 on evaluation today patient's bilateral lower extremities actually appear to be shown signs of improvement which is good news. Fortunately there does not appear to be any signs of active infection I think the anabiotic is helping in this regard. Overall I'm very pleased with how she is progressing. 09/15/18 patient was actually seen in ER yesterday due to her legs as well unfortunately. She states she's been having a lot of pain and discomfort as well as a lot of drainage. Upon inspection today the patient does have a lot of swelling and drainage I feel like this is more related to lymphedema and poor fluid control than it is to infection based on what I'm seeing. The physician in the emergency department also doubted that the patient was having a significant infection nonetheless I see no evidence of infection obvious at this point although I do see evidence of poor fluid control. She still not using a compression pumps, she is not elevating due to her lift chair as well as her hospital bed being broken, and she really is not keeping her legs up as much as they should be and also has  been taking off her wraps. All this combined I think has led to poor fluid control and to be honest she may be somewhat volume overloaded in general as well. I recommend that she may need to contact your physician to see if a prescription for a diuretic would be beneficial in their opinion. As long as this is safe I think it would likely help her. 09/29/18 on evaluation today patient's left lower extremity actually appears to be doing quite a bit better. At least compared to last time that I saw her. She still has a large area where she is draining from but there's a lot of new skin speckled trout and in fact there's more new skin that there are open areas of  weeping and drainage at this point. This is good news. With regard to the right lower extremity this is doing much better with the only open area that I really see being a dry spot on the right lateral ankle currently. Fortunately there's no signs of active infection at this time which is good news. No fevers, chills, nausea, or vomiting noted at this time. The patient seems somewhat stressed and overwhelmed during the visit today she was very lethargic as such. She does and she is not taking any pain medications at this point. Apparently according to her husband are also in the process of moving which is probably taking its toll on her as well. 10/06/18 on evaluation today patient appears to be doing rather well in regard to her lower extremities compared to last evaluation. Fortunately there's no signs of active infection. She tells me she did have an appointment with her primary. Nonetheless he was concerned that the wounds were somewhat deep based on pictures but we never actually saw her legs. She states that he had her somewhat worried due to the fact that she was fearing now that she was [place] have to have an amputation. With that being said based on what I'm seeing check she looks better this week that she has the last two times I've seen her with much less drainage I'm actually pleased in this regard. That doesn't mean that she's out of the water but again I do not think what the point of talking about education at all in regard to her leg. She is very happy to hear this. She is also not having as much pain as she was having last week. 10/13/18 unfortunately on evaluation today patient still continues to have a significant amount of drainage she's not letting home health actually apply the compression dressings at this point. She's trying to use of Juxta-Lite of the top of Kerlex and the second layer of the three layer compression wrap. With that being said she just does not seem to be making as  good a progress as I would expect if she was having the compression applied and in place on a regular basis. No fevers, chills, nausea, or vomiting noted at this time. 10/20/18 on evaluation today patient appears to be doing a little better in regard to her bilateral lower extremity ulcers. In fact the right lower extremity seems to be healed she doesn't even have any openings at this point left lower extremity though still somewhat macerated seems to be showing signs of new skin growth at multiple locations throughout. Fortunately there's no evidence of active infection at this time. No fevers, chills, nausea, or vomiting noted at this time. 10/27/18 on evaluation today patient appears to be doing much better in  regard to her left lower Trinity ulcer. She's been tolerating the laptop complication and has minimal drainage noted at this point. Fortunately there's no signs of active infection at this time. No fevers, chills, nausea, or vomiting noted at this time. 11/03/18 on evaluation today patient actually appears to be doing excellent in regard to her left lower extremity. She is having very little drainage at this point there does not appear to be any significant signs of infection overall very pleased with how things have gone. She is likewise extremely pleased still and seems to be making wonderful progress week to week. I do believe antibiotics were helpful for her. Her primary care provider did place on amateur clean since I last saw her. 11/17/18 on evaluation today patient appears to be doing worse in regard to her bilateral lower extremities at this point. She is been tolerating the dressing changes without complication. With that being said she typically takes the Coban off fairly quickly upon arriving home even after being seen here in the clinic and does not allow home health reapply command as part of the dressing at home. Therefore she said no compression essentially since I last saw her as  best I can tell. With that being said I think it shows and how much swelling she has in the open wounds that are noted at this point. Fortunately there's no signs of infection but unfortunately if she doesn't get this under control I think she will end up with infection and more significant issues. 11/24/18 on evaluation today patient actually appears to be doing somewhat better in regard to her bilateral lower extremities. 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 Nonetheless that is doing better currently so she should be able to attempt reinitiating the lymphedema pumps at this time. No fevers, chills, nausea, or vomiting noted at this time. 12/01/18 upon evaluation today patient's left lower extremity appears to be doing quite well unfortunately her right lower extremity is not doing nearly as well. She has been tolerating the dressing changes without complication unfortunately she did not keep a wrap on the right at this time. Nonetheless I believe this has led to increased swelling and weeping in the world is actually much larger than during the last evaluation with her. 12/08/18 on evaluation today patient appears to be doing about the same at this point in regard to her right lower extremity. There is some more palatable to touch I'm concerned about the possibility of there being some infection although I think the main issue is she's not keeping her compression wrap on which in turn is not allowing this area to heal appropriately. 12/22/18 on evaluation today patient appears to be doing better in regard to left lower extremity unfortunately significantly worse in regard to the right lower extremity. The areas of blistering and necrotic superficial tissue have spread and again this does not really appear to be signs of infection and all she just doesn't  seem to be doing nearly as well is what she has been in the past. Overall I feel like the Augmentin did absolutely nothing for her she doesn't seem to have any infection again I really didn't think so last time either is more of a potential preventative measure and hoping that this would make some difference but I think the main  issue is she's not wearing her compression. She tells me she cannot wear the [place] been we put on she takes it off pretty much upon getting home. Subsequently she worshiped Juxta-Lite when I questioned her about how often she wears it this is no more than three hours a day obviously that leaves 21 hours that she has no compression and this is obviously not doing well for her. Overall I'm concerned that if things continue to worsen she is at great risk of both infection as well as losing her leg. 01/05/19 on evaluation today patient appears to be doing well in regard to her left lower extremity which he is allowing [place] to wrap and not so well with regard to her right lower extremity which she is not allowing [place] to really wrap and keep the wrap on. She states that it hurts too badly whenever it's wrapped and she ends up having to take it off. She's been using the Juxta-Lite she tells me up to six hours a day although I question whether or not that's really been the case to be honest. Previously she told me three hours today nonetheless obviously the legs as long as the wrap is doing great when she is not is doing much more poorly. 01/12/2019 on evaluation today patient actually appears to be doing a little better in my opinion with regard to her right lower extremity ulcer. She has a small open area on the left lower extremity unfortunately but again this I think is part of the normal fluctuation of what she is going to have to expect with regard to her legs especially when she is not using her lymphedema pumps on a regular basis. Subsequently based on what I am seeing today I  think that she does seem to be doing slightly better with regard to her right lower extremity she did see her primary care provider on Monday they felt she had an infection and placed her on 2 antibiotics. Both Cipro and clindamycin. Subsequently again she seems possibly to be doing a little bit better in regards to the right lower extremity she also tells me however she has been wearing the compression wrap over the past week since I spoke with her as well that is a Kerlix and Coban wrap on the right. No fevers, chills, nausea, vomiting, or diarrhea. 01/19/2019 on evaluation today patient appears to be doing better with regard to her bilateral lower extremities especially the right. I feel like the compression has been beneficial for her which is great news. She did get a call from her primary care provider on her way here today telling her that she did have methicillin- resistant Staphylococcus aureus and he was calling in a couple new antibiotics for her including a ointment to be applied she tells me 3 times a day. With that being said this sounds like likely to be Bactroban which I think could be applied with each dressing/wrap change but I would not be able to accommodate her applying this 3 times a day. She is in agreement with the least doing this we will add that to her orders today. 01/26/2019 on evaluation today patient actually appears to be doing much better with regard to her right lower extremity. Her left lower extremity is also doing quite well all things considering. Fortunately there is no evidence of active infection at this time. No fevers, chills, nausea, vomiting, or diarrhea. 02/02/2019 on evaluation today patient appears to be doing much better compared to  her last evaluation. Little by little off like her right leg is returning more towards normal. There does not appear to be any signs of active infection and overall she seems to be doing quite well which is great news. I am very  pleased in this regard. No fevers, chills, nausea, vomiting, or diarrhea. 02/09/2019 upon evaluation today patient appears to be doing better with regard to her bilateral lower extremities. She has been tolerating the dressing changes without complication. Fortunately there is no signs of active infection at this time. No fevers, chills, nausea, vomiting, or diarrhea. 02/23/2019 on evaluation today patient actually appears to be doing quite well with regard to her bilateral lower extremities. She has been tolerating the dressing changes without complication. She is even used her pumps one time and states that she really felt like it felt good. With that being said she seems to be in good spirits and her legs appear to be doing excellent. 03/09/2019 on evaluation today patient appears to be doing well with regard to her right lower extremity there are no open wounds at this time she is having some discomfort but I feel like this is more neuropathy than anything. With regard to her left lower extremity she had several areas scattered around that she does have some weeping and drainage from but again overall she does not appear to be having any significant issues and no evidence of infection at this time which is good news. 03/23/2019 on evaluation today patient appears to be doing well with regard to her right lower extremity which she tells me is still close she is using her juxta light here. Her left lower extremity she mainly just has an area on the foot which is still slightly draining although this also is doing great. Overall very pleased at this time. 04/06/2019 patient appears to be doing a little bit worse in regard to her left lower extremity upon evaluation today. She feels like this could be becoming infected again which she had issues with previous. Fortunately there is no signs of systemic infection but again this is always a struggle with her with her legs she will go from doing well to not  so well in a very short amount of time. 04/20/2019 on evaluation today patient actually appears to be doing quite well with regard to her right lower extremity I do not see any signs of active infection at this time. Fortunately there is no fever chills noted. She is still taking the antibiotics which I prescribed for her at this point. In regard to the left lower extremity I do feel like some of these areas are better although again she still is having weeping from several locations at this time. 04/27/2019 on evaluation today patient appears to be doing about the same if not slightly worse in regard to her left lower extremity ulcers. She tells me when questioned that she has been sleeping in her Hoveround chair in fact she tells me she falls asleep without even knowing it. I think she is spending a whole lot of time in the chair and less time walking and moving around which is not good for her legs either. On top of that she is in a seated position which is also the worst position she is not really elevating her legs and she is also not using her lymphedema pumps. All this is good to contribute to worsening of her condition in general. 05/18/2019 on evaluation today patient appears to be doing well  with regard to her lower extremity on the right in fact this is showing no signs of any open wounds at this time. On the left she is continuing to have issues with areas that do drain. Some of the regions have healed and there are couple areas that have reopened. She did go to the ER per the patient according to recommendations from the home health nurse due to what she was seen when she came out on 05/13/2019. Subsequently she felt like the patient needed to go to the hospital due to the fact that again she was having "milky white discharge" from her leg. Nonetheless she had and then was placed on doxycycline and subsequently seems to be doing better. 06/01/2019 upon evaluation today patient appears to be  doing really in my opinion about the same. I do not see any signs of active infection which is good news. Overall she still has wounds over the bilateral lower extremities she has reopened on the right but this appears to be more of a crack where there is weeping/edema coming from the region. I do not see any evidence of infection at either site based on what I visualized today. 07/13/2019 upon evaluation today patient appears to be doing a little worse compared to last time I saw her. She since has been in the hospital from 06/21/2019 through 06/29/2019. This was secondary to having Covid. During that time they did apply lotion to her legs which unfortunately has caused her to develop a myriad of open wounds on her lower extremities. Her legs do appear to be doing better as far as the overall appearance is concerned but nonetheless she does have more open and weeping areas. 07/27/2019 upon evaluation today patient appears to be doing more poorly to be honest in regard to her left lower extremity in particular. There is no signs of systemic infection although I do believe she may have local infection. She notes she has been having a lot of blue/green drainage which is consistent potentially with Pseudomonas. That may be something that we need to consider here as well. The doxycycline does not seem to have been helping. 08/03/2019 upon evaluation today patient appears to be doing a little better in my opinion compared to last week's evaluation. Her culture I did review today and she is on appropriate medications to help treat the Enterobacter that was noted. Overall I feel like that is good news. With that being said she is unfortunately continuing to have a lot of drainage and though it is doing better I still think she has a long ways to go to get things dried up in general. Fortunately there is no signs of systemic infection. 08/10/2019 upon evaluation today patient appears to be doing may be slightly  better in regard to her left lower extremity the right lower extremity is doing much better. Fortunately there is no signs of infection right now which is good news. No fevers, chills, nausea, vomiting, or diarrhea. 08/24/2019 on evaluation today patient appears to be doing slightly better in regard to her lower extremities. The left lower extremity seems to be healed the right lower extremity is doing better though not completely healed as far as the openings are concerned. She has some generalized issues here with edema and weeping secondary to her lymphedema though again I do believe this is little bit drier compared to prior weeks evaluations. In general I am very pleased with how things seem to be progressing. No fevers, chills, nausea, vomiting,  or diarrhea. 08/31/2019 upon evaluation today patient actually seems to making some progress here with regard to the left lower extremity in particular. She has been tolerating the dressing changes without complication. Fortunately there is no signs of active infection at this time. No fevers, chills, nausea, vomiting, or diarrhea. She did see Dr. Doren Custard and he did note that she did have a issue with the left great saphenous vein and the small saphenous vein in the leg. With that being said he was concerned about the possibility of laser ablation not being extremely successful. He also mentioned a small risk of DVT associated with the procedure. However if the wounds do not continue to improve he stated that that would probably be the way to go. Fortunately the patient's legs do seem to be doing much better. 09/07/2019 upon evaluation today patient appears to be doing better with regard to her lower extremities. She has been tolerating the dressing changes without complication. With that being said she is showing signs of improvement and overall very pleased. There are some areas on her leg that I think we do need to debride we discussed this last week the  patient is in agreement with doing that as long as it does not hurt too badly. 09/14/2019 upon evaluation today patient appears to be doing decently well with regard to her left lower extremity. She is not having near as much weeping as she has had in the past things seem to be drying up which is good news. There is no signs of active infection at this time. 09/21/19 upon evaluation today patient appears to be doing better in regard overall to her bilateral lower extremities. She again has less open than she did previous and each week I feel like this is getting better. Fortunately there is no signs of active infection at this time. No fevers, chills, nausea, vomiting, or diarrhea. 09/28/2019 upon evaluation today patient actually appears to be showing signs of improvement with regard to her left lower extremity. Unfortunately the right medial lower extremity around the ankle region has reopened to some degree but this appears to be minimal still which is good news. There is no signs of active infection at this time which is also good news. 10/12/2019 upon evaluation today patient appears to be doing okay with regard to her bilateral lower extremities today. The right is a little bit worse then last evaluation 2 weeks ago. The left is actually doing a little better in my opinion. Overall there is no signs of active infection at this time that I see. Obviously that something we have to keep a close eye on she is very prone to this with the significant and multiple openings that she has over the bilateral lower extremities. 10/19/2019 upon evaluation today patient appears to be doing about the best that I have seen her in quite some time. She has been tolerating the dressing changes without complication. There does not appear to be any signs of active infection and overall I am extremely happy with the way her legs appeared. She is drying up quite nicely and overall is having less pain. 11/09/2019 upon  evaluation today patient appears to be doing better in regard to her wounds. She seems to be drying up more and more each time I see her this is just taking a very long time. Fortunately there is no signs of active infection at this time. 11/23/2019 upon evaluation today patient actually appears to be doing excellent in regard to  her lower extremities at this point compared to where she has been. Fortunately there is no signs of active infection at this time. She did go to the hospital last week for nausea and vomiting completely unrelated to her wounds. Fortunately she is doing better she was given some Reglan and got better. She had associated abdominal pain but they never found out what was going on. 12/07/2019 upon evaluation today patient appears to be doing well for the most part in regard to her legs. She unfortunately has not been keeping the [place] portion of her wraps on therefore the compression has not really been sufficient for what it is supposed to be. Nonetheless she tells me that it just hurt too bad therefore she removed it. 12/21/2019 upon evaluation today patient actually appears to be doing quite well with regard to her legs. I do feel like she has been making progress which is great news and overall there is no signs of active infection at this time. No fevers, chills, nausea, vomiting, or diarrhea. 01/04/2020 upon evaluation today patient presents for follow-up concerning her lower extremity edema bilaterally. She still has open wounds she has not been using her lymphedema pumps. She is also not been utilizing her compression wraps appropriately she tends to unwrap them, take them off, or states that they hurt. Obviously the reason they hurt is because her legs start to swell but the issue is if she would use her compression/lymphedema pumps regularly she would not swell and she would have the pain. Nonetheless she has not even picked them up once honestly over the past several months  and may be even as much as in the past year based on my opinion and what have seen. She tells me today that after last week when I talked about this with her specifically actually that was 2 weeks ago that she "forgot". 8//21 on evaluation today patient appears to be doing a little better in regard to her legs bilaterally. Fortunately there is no signs of active infection at this time. She tells me that she used her lymphedema pumps all of one time over the past 2 weeks since I last saw her. She tells me that she has been too busy in order to continue to use these. 02/01/2020 on evaluation today patient appears to be doing some better in regard to her wounds in general in her legs. We felt the right was healed although is not completely it does appear to be doing better she tells me she has been using her lymphedema pumps that she has had this six times since I last saw her. Obviously the more she does that the better she would do my opinion 02/15/2020 upon evaluation today patient appears to be doing about the same in regard to her legs. She tells me that she is pumping I'm still not sure how much she does to be perfectly honest. However even if she does a little bit here and there I guess that is better than nothing. Fortunately there is no sign of active infection at this time which is great news. No fevers, chills, nausea, vomiting, or diarrhea. 02/29/2020 on evaluation today patient actually appears to be doing quite well all things considered this week. She has been tolerating the dressing changes without complication. Fortunately there is no signs of active infection at this time. No fevers, chills, nausea, vomiting, or diarrhea. 03/14/2020 upon evaluation today patient appears to be doing really about the same in regard to her legs.  There is no signs of improvement overall and she as noted from home health does not appear to be elevating her legs he can get into her lift chair. There is too much  stuff piled up on it the patient tells me. She also tells me she cannot really use her pumps effectively due to the fact that she cannot have any space to get them on. Finally she is also not really elevating her legs because she is not sleeping in her bed she is sleeping in her chair currently and again overall I think everything that she is done in combination has been exactly the wrong thing for what she needs for her legs. 04/04/2020 upon evaluation today patient appears to be doing well at this time with regard to her legs. She is actually been pumping, keeping her wraps on, and to be honest she seems to be doing dramatically better the right leg is excellent the left leg is also excellent and measuring much smaller than previous. 04/18/2020 upon evaluation today patient actually is continue to make good progress in regard to her lower extremities bilaterally. Everything is improving and less wet that has been in the past overall I am extremely pleased with where things stand and I think that she is making great progress. The patient tells me she still continue to use her compression pumps 05/02/2020 on evaluation today patient appears to be doing well at this time in regard to her left leg which is showing signs of drying up. With that being said she does have a lot of lymphedema type crusty skin around the toes of her left foot and the right medial ankle which has opened at this point. Fortunately there is no signs of active infection systemically at this point or even locally for that matter. 05/23/2020 on evaluation today patient appears to be doing well with regard to her lower extremities. Fortunately there is no signs of active infection at this time. No fever chills noted. She has been very depressed however she tells me. 06/06/2020 patient came in today for evaluation in regard to her bilateral lower extremity ulcerations. With that being said she came in feeling okay and actually laughing  and joking around with the staff checking her in. Subsequently however she had a coughing spell and following the coughing spell it was a dramatic conversion from being jovial and joking around to being extremely short of breath her vital signs actually dropped in regard to her blood pressure from around 175 to down around 562 for systolic and from around 96 diastolic down to around 70. With that being said she also accompanied this with an increase in her respiratory rate which was also quite significant. Nonetheless I actually upon going into see her was extremely worried we called EMS to have her transported to the ER for further evaluation and treatment. She continued until EMS got here to be extremely short of breath even on 6 L of oxygen. Her oxygen saturation did come up to 99% but overall it was only 95 before which was not terrible she did feel like the oxygen helped her feel somewhat better however. She has a history of asthma but again even the coughing spell really should not have done this degree of alteration in her demeanor from where she was just before to after the coughing spell. She tells [place] however she has been feeling somewhat abnormal since Friday although she could not really pinpoint exactly what was going on. 06/27/2020 plan evaluation  today patient appears to be doing okay in regard to her leg ulcers. She is really not showing a lot of improvement to be honest and she still has a lot of weeping. With that being said after I last saw her we called EMS to take her to the hospital she actually did not go she went to see her primary care provider who according to the patient have not identified and read through the note states that everything checked out okay. Nonetheless the patient has continued to have bouts where she gets very short of breath for seemingly no reason whatsoever. That happened even the same night that I saw her last time. This was after she refused transport to  the hospital. With that being said she also tells me that just getting dressed to come here is quite a chore which is putting on her shirt causing her to have to stop to catch her breath. Obviously this is not normal and I feel like there is something going on. She may need a referral to be seen by cardiology ASAP. She does see her primary care provider early next week she tells me 07/11/2020 upon evaluation today patient's wounds again appear to be doing about the same she mainly has weeping of the bilateral lower extremities both are very swollen today she tells me she has been using her pumps but last time I saw her she told me she did not even have her pumps out that she had "packed them away because they had too much stuff in their house. Apparently her husband has been bringing stuff in from storage units. She then subsequently told me that she had so much stuff in her house that she has been staying up all night and did not go to bed till 7:00 this morning because she was cleaning up stuff. Nonetheless I am still unsure as to whether or not she is using the pumps based on what I am seeing I would think probably not. Nonetheless she has been keeping the compression wraps in place that is at least something 07/25/2020 upon evaluation today patient appears to be doing well currently in regard to her legs all things considered. I do not think she is doing any worse significantly although honestly I do not think she is doing a lot better either. There does not appear to be any signs of infection which is good news although she does have a lot of weeping. She is using her pumps she tells me sporadically though she says that her husband is not a big fan of helping her to get the [place]. She also tells me is difficult because in their house. It would be easiest for her to use these as where her son is actually staying with her and living out at this point. Obviously I understand that she is having a lot of  issues here and to be honest I do not really know what to say in that regard and the fact that I really cannot change the situations but I do believe she really needs to be using the lymphedema pumps to try to keep things under control here. Outside of that I think she is going to continue to have significant issues. I think the pumps coupled with good compression and elevation are the only things that are to help her at this point. 08/08/2020 upon evaluation today patient appears to be doing a little worse in regard to her legs in general. I think this may  be due to some infection currently. With that being said I am going to go ahead and likely see about putting her on an antibiotic. Were also can obtain a culture today where she had some purulent drainage. 08/29/2020 upon evaluation today patient appears to be doing about the same in regard to her bilateral lower extremities. Unfortunately she is continuing to have significant issues here with edema and she has significant lymphedema. With that being said she is really not doing anything that she is supposed to be doing as far as elevation, compression, using lymphedema pumps or anything really for that matter. She does not use her pumps, is taken the wraps off shortly after having them put on as far as home health is concerned at least by the next day she tells me, and overall is not really elevating her legs she is telling me she has a lot to do as far as cleaning up in her house. Nonetheless this triad of noncompliance is leading to worsening not improving in regard to her legs. I had a very strong conversation with her about this today again. 09/12/2020 on evaluation today patient appears to be doing poorly with regard to her lower extremities bilaterally. Fortunately there does not appear to be any signs of active infection at this point. That is systemically. Locally there is some signs of pus coming from an area on her left leg. She has been on  antibiotics recently though she is done with those currently. Nonetheless she tells me she has been having increased pain with the right leg this is extremely swollen as well. Fortunately there does not appear to be significant infection of the right leg though I think her pain is really as result of the increased swelling she has been declining the wrap on the right leg and the wounds here are getting significantly worse week by week. This obviously is not will be want to see. I discussed with the patient however if she does not use the wraps, use her pumps, and elevate her legs that she is not to get better. She has been sleeping in her motorized Hoveround which does not even allow her to elevate her legs at all. She tells me that she is also constantly "cleaning up as her husband told her that he wants to move back to [place]." Unfortunately this has been the narrative for the past several months in fact that she tells me that she has been having to clean up mass that was moved in from storage units into their home and that she does not really sleep and she never really elevates her legs. I explained to the patient today that if she is not can I do any of this that there is really nothing that I can do to help her. 09/26/20 upon evaluation today patient appears to be doing about the same in regard to her wounds. Fortunately there is no signs of active infection at this time. No fever chills noted. She has been tolerating the dressing changes without complication which is good news. With that being said I do think that she unfortunately is still having a lot of pain but I think this is due to the swelling that really is not controlled and as I discussed with her last time she needs to be using her lymphedema pumps there is still in the box she has not even attempted to use those whatsoever. I am also not even certain she is taking her fluid pills  as she states she may need to have "get a refill"  she was seen with her husband at the appointment today. 10/24/2020 upon evaluation today patient appears to be doing decently well in regard to her legs all things considered. She has been tolerating the dressing changes without complication. Fortunately there is no signs of active infection at this time. No fevers, chills, nausea, vomiting, or diarrhea. With that being said the patient does appear to have some excessive thickened skin buildup currently that has been require some sharp debridement to clear this away. I am hopeful if we do this we will be able to get some of this area to dry out this otherwise trapping fluid underneath. Her son is present during the office visit today. She tells me she still been doing a lot as far as cleaning up and going through boxes she tells me she has been up for a long time already this morning. 5/25; patient presents for 2-week follow-up. She had 3 layer compression wrap placed with calcium alginate underneath. She reports tolerating the wraps well. She has home health that changes the wrap 2-3 times a week. She denies signs of infection. She has almost finished her course of antibiotics. She uses her lymphedema pumps once weekly. 11/21/2020 upon evaluation today patient appears to be doing well with regard to her legs all things considered. Fortunately there is no signs of active infection at this time which is great news I do not see any evidence of infection and overall I think that she is definitely improved compared to where things did previous. Nonetheless I do think that she may benefit from removing some of the thicker skin on the legs. Again we have attempted this in the past to some degree but I think that we might be able to more effectively do this of a clear some way and then potentially use a urea cream with salicylic acid to try to help clear some of this away. The patient is in agreement with the plan. 12/05/2020 upon evaluation today patient appears  to be doing well with regard to her wounds in general. I feel like that things are somewhat improved the skin looking a little better. With that being said she still has significant lymphedema. She is not been using her lymphedema pumps and not really been elevating her legs. She also did have some maggots noted on the left leg she tells me that the "gnats are terrible and get all over her foot." With that being said I am concerned about the fact that the patient still is quite swollen even with the compression wraps I really feel like she needs to be using her pumps but I cannot talk her into doing this. 12/19/2020 upon evaluation today patient appears to be doing about the same in regard to her legs. Fortunately I do not see any signs of infection unfortunately she really seems to be very nonchalant about the fact that she has not been using her lymphedema pumps. She tells me that "I just forget". With that being said this needs to be something that is a priority for her I cannot count the number of times that have had this discussion with her and yet nothing seems to change. With that being said I think that is very unlikely she is ever really getting healed with regard to her wounds to be honest she does not seem to be motivated to do anything to try to help at this point despite everything that  I have tried to do and recommend for her. Objective Constitutional Well-nourished and well-hydrated in no acute distress. Vitals Time Taken: 12:15 PM, Height: 62 in, Weight: 335 lbs, BMI: 61.3, Temperature: 98.9 F, Pulse: 78 bpm, Respiratory Rate: 18 breaths/min, Blood Pressure: 118/74 mmHg, Pulse Oximetry: 93 %. Respiratory normal breathing without difficulty. Psychiatric this patient is able to make decisions and demonstrates good insight into disease process. Alert and Oriented x 3. pleasant and cooperative. General Notes: Upon inspection patient's wound bed actually showed signs of is a weeping  over the bilateral lower extremities. Fortunately there does not appear to be any signs of infection currently which is good news but nonetheless she is really not making good progress and again this is in part due to the fact that she for various reasons ends up at times taken her wraps off this time it was because she states the left wrap got wet. This leg is significantly more swollen today. She also does not use her lymphedema pumps at all in fact she does not even appear to be trying to do this she tells me she forgets despite the fact that her sitting right in front of her in her house. Integumentary (Hair, Skin) Wound #61 status is Open. Original cause of wound was Gradually Appeared. The date acquired was: 04/20/2019. The wound has been in treatment 87 weeks. The wound is located on the Left,Circumferential Lower Leg. The wound measures 14cm length x 34.5cm width x 0.1cm depth; 379.347cm^2 area and 37.935cm^3 volume. There is Fat Layer (Subcutaneous Tissue) exposed. There is no tunneling or undermining noted. There is a large amount of serous drainage noted. The wound margin is indistinct and nonvisible. There is large (67-100%) pink, pale granulation within the wound bed. There is no necrotic tissue within the wound bed. Wound #64 status is Open. Original cause of wound was Gradually Appeared. The date acquired was: 06/01/2019. The wound has been in treatment 81 weeks. The wound is located on the Left,Dorsal Foot. The wound measures 7.8cm length x 8cm width x 0.1cm depth; 49.009cm^2 area and 4.901cm^3 volume. There is Fat Layer (Subcutaneous Tissue) exposed. There is no tunneling or undermining noted. There is a medium amount of serous drainage noted. The wound margin is indistinct and nonvisible. There is large (67-100%) pink, pale granulation within the wound bed. There is no necrotic tissue within the wound bed. Wound #67 status is Open. Original cause of wound was Gradually Appeared. The  date acquired was: 05/02/2020. The wound has been in treatment 33 weeks. The wound is located on the Right,Medial Lower Leg. The wound measures 5.1cm length x 3.9cm width x 0.1cm depth; 15.622cm^2 area and 1.562cm^3 volume. There is Fat Layer (Subcutaneous Tissue) exposed. There is no tunneling or undermining noted. There is a medium amount of serosanguineous drainage noted. The wound margin is indistinct and nonvisible. There is large (67-100%) pink, pale granulation within the wound bed. There is a small (1-33%) amount of necrotic tissue within the wound bed including Adherent Slough. 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 There was a Three Layer Compression Therapy Procedure by Leane Call, RN. Post procedure Diagnosis Wound #61: Same as Pre-Procedure There was a Three Layer Compression Therapy Procedure by Leane Call, RN. Post procedure Diagnosis Wound #: Same as Pre-Procedure Plan Follow-up Appointments: Return Appointment in 2 weeks. Bathing/ Shower/ Hygiene: May shower and wash wound with soap and water. - with dressing changes, wash both legs with wash cloth and soap and water with dressing changes Other  Bathing/Shower/Hygiene Orders/Instructions: - Home health to wash legs with warn soapy water and a clean wash cloth with dressing changes Edema Control - Lymphedema / SCD / Other: Lymphedema Pumps. Use Lymphedema pumps on leg(s) 2-3 times a day for 45-60 minutes. If wearing any wraps or hose, do not remove them. Continue exercising as instructed. Elevate legs to the level of the heart or above for 30 minutes daily and/or when sitting, a frequency of: - especially at night Avoid standing for long periods of time. Exercise regularly Home Health: No change in wound care orders this week; continue Home Health for wound care. May utilize formulary equivalent dressing for wound treatment orders unless otherwise specified. - add urea cream to legs with dressing changes when available Dressing changes to be completed by [place] on Monday / Wednesday / Friday except when patient has scheduled visit at [facility]. Other Home Health Orders/Instructions: - [facility] WOUND #61: - Lower Leg Wound Laterality: Left, Circumferential Peri-Wound Care: Sween Lotion (Moisturizing lotion) (Home Health) 3 x Per Week/30 Days Discharge Instructions: Apply moisturizing lotion to dry skin on legs Peri-Wound Care: Urea Cream 40%, tube, 1 (oz) 3 x Per Week/30 Days Discharge Instructions: apply to lower legs with dressing changes Prim Dressing: Maxorb Extra Calcium Alginate 2x2 in (Home Health) 3 x Per Week/30 Days ary Discharge Instructions: Apply calcium alginate to wound bed as instructed Secondary Dressing: ABD Pad, 8x10 (Home Health) 3 x Per Week/30 Days Discharge Instructions: Apply over primary dressing as directed. Secondary Dressing: Zetuvit Plus 4x4 in (Home Health) 3 x Per Week/30 Days Discharge Instructions: or equivalent extra absorbent pad.Apply over primary dressing as directed. Com pression Wrap: ThreePress (3 layer compression wrap) (Home Health) 3 x Per Week/30 Days Discharge Instructions:  Apply three layer compression as directed. Pad bend of ankle with foam or ABD pad. WOUND #64: - Foot Wound Laterality: Dorsal, Left Peri-Wound Care: Sween Lotion (Moisturizing lotion) (Home Health) 3 x Per Week/30 Days Discharge Instructions: Apply moisturizing lotion to dry skin on legs Peri-Wound Care: Urea Cream 40%, tube, 1 (oz) 3 x Per Week/30 Days Discharge Instructions: apply to lower legs with dressing changes Prim Dressing: Maxorb Extra Calcium Alginate 2x2 in (Home Health) 3 x Per Week/30 Days ary Discharge Instructions: Apply calcium alginate to wound bed as instructed Secondary Dressing: ABD Pad, 8x10 (Home Health) 3 x Per Week/30 Days Discharge Instructions: Apply over primary dressing, also cover toes with ABD pad for closed dressing Secondary Dressing: Zetuvit Plus 4x4 in (Home Health) 3 x Per Week/30 Days Discharge Instructions: or equivalent extra absorbent pad.Apply over primary dressing as directed. Com pression Wrap: ThreePress (3 layer compression wrap) (Home Health) 3 x Per Week/30 Days Discharge Instructions: Apply three layer compression as directed. Pad bend of ankle with foam or ABD pad. WOUND #67: - Lower Leg Wound Laterality: Right, Medial Peri-Wound  Care: Sween Lotion (Moisturizing lotion) (Home Health) 3 x Per Week/30 Days Discharge Instructions: Apply moisturizing lotion to dry skin on legs Peri-Wound Care: Urea Cream 40%, tube, 1 (oz) 3 x Per Week/30 Days Discharge Instructions: apply to lower legs with dressing changes Prim Dressing: Maxorb Extra Calcium Alginate 2x2 in (Home Health) 3 x Per Week/30 Days ary Discharge Instructions: Apply calcium alginate to wound bed as instructed Secondary Dressing: ABD Pad, 8x10 (Home Health) 3 x Per Week/30 Days Discharge Instructions: Apply over primary dressing, also cover toes with ABD pad for closed dressing Secondary Dressing: Zetuvit Plus 4x4 in (Home Health) 3 x Per Week/30 Days Discharge Instructions: or  equivalent extra absorbent pad.Apply over primary dressing as directed. Com pression Wrap: ThreePress (3 layer compression wrap) (Home Health) 3 x Per Week/30 Days Discharge Instructions: Apply three layer compression as directed. Pad bend of ankle with foam or ABD pad. 1. Would recommend currently that we going continue with the wound care measures as before and the patient is in agreement with that plan. We will get a be using the lotion to the legs to try to help with clearing up some of the dry skin. I recommended urea cream specifically although the patient has not been able to get this apparently. I gave her the information where to get this off [place] again today. 2. Would recommend continuation of the silver alginate to help with controlling the drainage and any open areas. 3. Also can recommend that we continue with ABD pads to cover followed by the tubing over the like. 4. Lastly will be continue to apply the 3 layer compression wrap bilaterally. 5. I again reiterated that she needs to be using her lymphedema pumps regularly again she does not use these at all. We will see patient back for reevaluation in 2 weeks here in the clinic. If anything worsens or changes patient will contact our office for additional recommendations. Electronic Signature(s) Signed: 12/19/2020 1:28:13 PM By: [name] PA-C Entered By: [name] on 12/19/2020 13:28:13 -------------------------------------------------------------------------------- SuperBill Details Patient Name: Date of Service: [name] 12/19/2020 Medical Record Number: [id number] Patient Account Number: [id number] Date of Birth/Sex: Treating RN: Jul 13, 1950 (70 y.o. [name] Primary Care Provider: [name] Other Clinician: Referring Provider: Treating Provider/Extender: Darlen Round in Treatment: 176 Diagnosis Coding ICD-10 Codes Code Description E11.622 Type 2 diabetes mellitus with  other skin ulcer I89.0 Lymphedema, not elsewhere classified I87.331 Chronic venous hypertension (idiopathic) with ulcer and inflammation of right lower extremity I87.332 Chronic venous hypertension (idiopathic) with ulcer and inflammation of left lower extremity L97.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 leg with fat layer exposed L97.822 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 I10 Essential (primary) hypertension E66.01 Morbid (severe) obesity due to excess calories F41.8 Other specified anxiety disorders R53.1 Weakness Facility Procedures The patient participates with Medicare or their insurance follows the Medicare Facility Guidelines: CPT4 Description Modifier Quantity Code [id number] BILATERAL: Application of multi-layer venous compression system; leg (below knee), including ankle and 1 foot. Physician Procedures : CPT4 Code Description Modifier [id number] - WC PHYS LEVEL 4 - EST PT ICD-10 Diagnosis Description E11.622 Type 2 diabetes mellitus with other skin ulcer I89.0 Lymphedema, not elsewhere classified I87.331 Chronic venous hypertension (idiopathic) with  ulcer and inflammation of right lower extremity I87.332 Chronic venous hypertension (idiopathic)  with ulcer and inflammation of left lower extremity Quantity: 1 Electronic Signature(s) Signed: 12/19/2020 1:28:47 PM By: [name] PA-C Entered By: [name] on 12/19/2020 13:28:45

## 2020-12-19 NOTE — Progress Notes (Signed)
[name], [name] ([id number]) Visit Report for 12/19/2020 Arrival Information Details Patient Name: Date of Service: [name], [name] 12/19/2020 11:00 A M Medical Record Number: [id number] Patient Account Number: [id number] Date of Birth/Sex: Treating RN: 06-27-1950 (70 y.o. Nita Sells Primary Care Jeidy Hoerner: [name] Other Clinician: Referring Sinai Illingworth: Treating Regnia Mathwig/Extender: Darlen Round in Treatment: 30 Visit Information History Since Last Visit All ordered tests and consults were completed: No Patient Arrived: Wheel Chair Added or deleted any medications: No Arrival Time: 12:00 Any new allergies or adverse reactions: No Accompanied By: spouse Had a fall or experienced change in No Transfer Assistance: None activities of daily living that may affect Patient Identification Verified: Yes risk of falls: Secondary Verification Process Completed: Yes Signs or symptoms of abuse/neglect since last visito No Patient Requires Transmission-Based Precautions: No Hospitalized since last visit: No Patient Has Alerts: Yes Implantable device outside of the clinic excluding No Patient Alerts: R ABI= 1.01 cellular tissue based products placed in the center L ABI = .99 since last visit: Has Dressing in Place as Prescribed: Yes Has Compression in Place as Prescribed: Yes Pain Present Now: No Electronic Signature(s) Signed: 12/19/2020 5:07:06 PM By: Leane Call Entered By: Leane Call on 12/19/2020 12:22:29 -------------------------------------------------------------------------------- Compression Therapy Details Patient Name: Date of Service: [name]. 12/19/2020 11:00 A M Medical Record Number: [id number] Patient Account Number: [id number] Date of Birth/Sex: Treating RN: Jan 02, 1951 (70 y.o. [name] Primary Care Jaonna Word: [name] Other Clinician: Referring Makiyla Linch: Treating Narcisa Ganesh/Extender: Darlen Round in Treatment: 176 Compression Therapy Performed for Wound Assessment: Wound #61 Left,Circumferential Lower Leg Performed By: Clinician Leane Call, RN Compression Type: Three Layer Post Procedure Diagnosis Same as Pre-procedure Electronic Signature(s) Signed: 12/19/2020 6:17:15 PM By: [name] RN, BSN Entered By: [name] on 12/19/2020 12:46:59 -------------------------------------------------------------------------------- Compression Therapy Details Patient Name: Date of Service: [name]. 12/19/2020 11:00 A M Medical Record Number: [id number] Patient Account Number: [id number] Date of Birth/Sex: Treating RN: [birth year] (70 y.o. [name] Primary Care Rada Zegers: [name] Other Clinician: Referring Alcee Sipos: Treating Ozil Stettler/Extender: Darlen Round in Treatment: 176 Compression Therapy Performed for Wound Assessment: NonWound Condition Lymphedema - Right Leg Performed By: Clinician Leane Call, RN Compression Type: Three Layer Post Procedure Diagnosis Same as Pre-procedure Electronic Signature(s) Signed: 12/19/2020 6:17:15 PM By: [name] RN, BSN Entered By: [name] on 12/19/2020 12:47:34 -------------------------------------------------------------------------------- Encounter Discharge Information Details Patient Name: Date of Service: [name]. 12/19/2020 11:00 A M Medical Record Number: [id number] Patient Account Number: [id number] Date of Birth/Sex: Treating RN: 1951/03/01 (70 y.o. Nita Sells Primary Care Avelino Herren: [name] Other Clinician: Referring Aislynn Cifelli: Treating Kerianne Gurr/Extender: Darlen Round in Treatment: [phone] Encounter Discharge Information Items Discharge Condition: Stable Ambulatory Status: Wheelchair Discharge Destination: Home Transportation: Private Auto Accompanied By: alone Schedule Follow-up Appointment: No Clinical  Summary of Care: Patient Declined Electronic Signature(s) Signed: 12/19/2020 5:07:06 PM By: Leane Call Entered By: Leane Call on 12/19/2020 16:56:41 -------------------------------------------------------------------------------- Lower Extremity Assessment Details Patient Name: Date of Service: [name], [name] 12/19/2020 11:00 A M Medical Record Number: [id number] Patient Account Number: [id number] Date of Birth/Sex: Treating RN: 07-31-1950 (70 y.o. Nita Sells Primary Care Jerimie Mancuso: [name] Other Clinician: Referring Zailynn Brandel: Treating Torra Pala/Extender: Darlen Round in Treatment: 176 Edema Assessment Assessed: [Left: No] [Right: No] Edema: [Left: Yes] [Right: Yes] Calf Left: Right: Point of Measurement: 33 cm From Medial Instep 46 cm 42  cm Ankle Left: Right: Point of Measurement: 9 cm From Medial Instep 31 cm 25 cm Vascular Assessment Pulses: Dorsalis Pedis Palpable: [Left:Yes] [Right:Yes] Electronic Signature(s) Signed: 12/19/2020 5:07:06 PM By: Leane Call Entered By: Leane Call on 12/19/2020 12:23:36 -------------------------------------------------------------------------------- Multi-Disciplinary Care Plan Details Patient Name: Date of Service: [name]. 12/19/2020 11:00 A M Medical Record Number: [id number] Patient Account Number: [id number] Date of Birth/Sex: Treating RN: [birth year] (70 y.o. [name] Primary Care Dilynn Munroe: [name] Other Clinician: Referring Marzell Isakson: Treating Joyclyn Plazola/Extender: Darlen Round in Treatment: [place] reviewed with physician Active Inactive Venous Leg Ulcer Nursing Diagnoses: Actual venous Insuffiency (use after diagnosis is confirmed) Knowledge deficit related to disease process and management Goals: Patient will maintain optimal edema control Date Initiated: 08/12/2017 Target Resolution Date: 01/16/2021 Goal  Status: Active Patient/caregiver will verbalize understanding of disease process and disease management Date Initiated: 08/12/2017 Date Inactivated: 04/21/2018 Target Resolution Date: 04/24/2018 Goal Status: Met Interventions: Assess peripheral edema status every visit. Compression as ordered Treatment Activities: Therapeutic compression applied : 08/12/2017 Notes: Wound/Skin Impairment Nursing Diagnoses: Impaired tissue integrity Knowledge deficit related to ulceration/compromised skin integrity Goals: Patient/caregiver will verbalize understanding of skin care regimen Date Initiated: 08/12/2017 Target Resolution Date: 01/16/2021 Goal Status: Active Ulcer/skin breakdown will have a volume reduction of 30% by week 4 Date Initiated: 08/05/2017 Date Inactivated: 09/30/2017 Target Resolution Date: 10/03/2017 Goal Status: Met Ulcer/skin breakdown will have a volume reduction of 50% by week 8 Date Initiated: 09/30/2017 Date Inactivated: 10/28/2017 Target Resolution Date: 10/28/2017 Goal Status: Met Interventions: Assess patient/caregiver ability to perform ulcer/skin care regimen upon admission and as needed Assess ulceration(s) every visit Provide education on ulcer and skin care Screen for HBO Treatment Activities: Patient referred to home care : 08/05/2017 Skin care regimen initiated : 08/05/2017 Topical wound management initiated : 08/05/2017 Notes: Electronic Signature(s) Signed: 12/19/2020 6:17:15 PM By: [name] RN, BSN Entered By: [name] on 12/19/2020 12:48:34 -------------------------------------------------------------------------------- Pain Assessment Details Patient Name: Date of Service: [name]. 12/19/2020 11:00 A M Medical Record Number: [id number] Patient Account Number: [id number] Date of Birth/Sex: Treating RN: 1950/10/19 (71 y.o. Nita Sells Primary Care Charnele Semple: [name] Other Clinician: Referring Zayneb Baucum: Treating  Arriel Victor/Extender: Darlen Round in Treatment: 176 Active Problems Location of Pain Severity and Description of Pain Patient Has Paino No Site Locations Rate the pain. Current Pain Level: 0 Pain Management and Medication Current Pain Management: Electronic Signature(s) Signed: 12/19/2020 5:07:06 PM By: Leane Call Entered By: Leane Call on 12/19/2020 12:23:04 -------------------------------------------------------------------------------- Patient/Caregiver Education Details Patient Name: Date of Service: [name], [name] 7/6/2022andnbsp11:00 [place] Record Number: [id number] Patient Account Number: [id number] Date of Birth/Gender: Treating RN: 1951/05/26 (70 y.o. [name] Primary Care Physician: [name] Other Clinician: Referring Physician: Treating Physician/Extender: Darlen Round in Treatment: 76 Education Assessment Education Provided To: Patient Education Topics Provided Venous: Methods: Explain/Verbal Responses: Reinforcements needed, State content correctly Wound/Skin Impairment: Methods: Explain/Verbal Responses: Reinforcements needed, State content correctly Electronic Signature(s) Signed: 12/19/2020 6:17:15 PM By: [name] RN, BSN Entered By: [name] on 12/19/2020 12:49:01 -------------------------------------------------------------------------------- Wound Assessment Details Patient Name: Date of Service: [name]. 12/19/2020 11:00 A M Medical Record Number: [id number] Patient Account Number: [id number] Date of Birth/Sex: Treating RN: [birth year] (70 y.o. Nita Sells Primary Care Hayli Milligan: [name] Other Clinician: Referring Xan Ingraham: Treating Ben Habermann/Extender: Darlen Round in Treatment: 176 Wound Status Wound Number: 61 Primary Venous Leg  Ulcer Etiology: Wound Location: Left, Circumferential Lower Leg Wound Open Wounding  Event: Gradually Appeared Status: Date Acquired: 04/20/2019 Comorbid Asthma, Hypertension, Peripheral Arterial Disease, Peripheral Weeks Of Treatment: 87 History: Venous Disease, Type II Diabetes, Gout, Osteoarthritis Clustered Wound: Yes Photos Photo Uploaded By: Sandre Kitty on 12/19/2020 16:52:11 Wound Measurements Length: (cm) 14 Width: (cm) 34.5 Depth: (cm) 0.1 Area: (cm) 379.347 Volume: (cm) 37.935 % Reduction in Area: -4931.1% % Reduction in Volume: -4931.2% Epithelialization: None Tunneling: No Undermining: No Wound Description Classification: Full Thickness Without Exposed Support Structures Wound Margin: Indistinct, nonvisible Exudate Amount: Large Exudate Type: Serous Exudate Color: amber Foul Odor After Cleansing: No Slough/Fibrino No Wound Bed Granulation Amount: Large (67-100%) Exposed Structure Granulation Quality: Pink, Pale Fascia Exposed: No Necrotic Amount: None Present (0%) Fat Layer (Subcutaneous Tissue) Exposed: Yes Tendon Exposed: No Muscle Exposed: No Joint Exposed: No Bone Exposed: No Treatment Notes Wound #61 (Lower Leg) Wound Laterality: Left, Circumferential Cleanser Peri-Wound Care Sween Lotion (Moisturizing lotion) Discharge Instruction: Apply moisturizing lotion to dry skin on legs Urea Cream 40%, tube, 1 (oz) Discharge Instruction: apply to lower legs with dressing changes Topical Primary Dressing Maxorb Extra Calcium Alginate 2x2 in Discharge Instruction: Apply calcium alginate to wound bed as instructed Secondary Dressing ABD Pad, 8x10 Discharge Instruction: Apply over primary dressing as directed. Zetuvit Plus 4x4 in Discharge Instruction: or equivalent extra absorbent pad.Apply over primary dressing as directed. Secured With Compression Wrap ThreePress (3 layer compression wrap) Discharge Instruction: Apply three layer compression as directed. Pad bend of ankle with foam or ABD pad. Compression  Stockings Add-Ons Electronic Signature(s) Signed: 12/19/2020 5:07:06 PM By: Leane Call Entered By: Leane Call on 12/19/2020 12:24:19 -------------------------------------------------------------------------------- Wound Assessment Details Patient Name: Date of Service: [name]. 12/19/2020 11:00 A M Medical Record Number: [id number] Patient Account Number: [id number] Date of Birth/Sex: Treating RN: Jan 12, 1951 (70 y.o. Nita Sells Primary Care Catriona Dillenbeck: [name] Other Clinician: Referring Thais Silberstein: Treating Jeda Pardue/Extender: Darlen Round in Treatment: 176 Wound Status Wound Number: 64 Primary Diabetic Wound/Ulcer of the Lower Extremity Etiology: Wound Location: Left, Dorsal Foot Wound Open Wounding Event: Gradually Appeared Status: Date Acquired: 06/01/2019 Comorbid Asthma, Hypertension, Peripheral Arterial Disease, Peripheral Weeks Of Treatment: 81 History: Venous Disease, Type II Diabetes, Gout, Osteoarthritis Clustered Wound: No Photos Photo Uploaded By: Sandre Kitty on 12/19/2020 16:52:00 Wound Measurements Length: (cm) 7.8 Width: (cm) 8 Depth: (cm) 0.1 Area: (cm) 49.009 Volume: (cm) 4.901 % Reduction in Area: -29602.4% % Reduction in Volume: -9902% Epithelialization: Small (1-33%) Tunneling: No Undermining: No Wound Description Classification: Grade 1 Wound Margin: Indistinct, nonvisible Exudate Amount: Medium Exudate Type: Serous Exudate Color: amber Foul Odor After Cleansing: No Slough/Fibrino No Wound Bed Granulation Amount: Large (67-100%) Exposed Structure Granulation Quality: Pink, Pale Fascia Exposed: No Necrotic Amount: None Present (0%) Fat Layer (Subcutaneous Tissue) Exposed: Yes Tendon Exposed: No Muscle Exposed: No Joint Exposed: No Bone Exposed: No Treatment Notes Wound #64 (Foot) Wound Laterality: Dorsal, Left Cleanser Peri-Wound Care Sween Lotion (Moisturizing lotion) Discharge  Instruction: Apply moisturizing lotion to dry skin on legs Urea Cream 40%, tube, 1 (oz) Discharge Instruction: apply to lower legs with dressing changes Topical Primary Dressing Maxorb Extra Calcium Alginate 2x2 in Discharge Instruction: Apply calcium alginate to wound bed as instructed Secondary Dressing ABD Pad, 8x10 Discharge Instruction: Apply over primary dressing, also cover toes with ABD pad for closed dressing Zetuvit Plus 4x4 in Discharge Instruction: or equivalent extra absorbent pad.Apply over primary dressing as directed. Secured With Compression Wrap ThreePress (3  layer compression wrap) Discharge Instruction: Apply three layer compression as directed. Pad bend of ankle with foam or ABD pad. Compression Stockings Add-Ons Electronic Signature(s) Signed: 12/19/2020 5:07:06 PM By: Leane Call Entered By: Leane Call on 12/19/2020 12:24:58 -------------------------------------------------------------------------------- Wound Assessment Details Patient Name: Date of Service: [name]. 12/19/2020 11:00 A M Medical Record Number: [id number] Patient Account Number: [id number] Date of Birth/Sex: Treating RN: [birth year] (70 y.o. Nita Sells Primary Care Regie Bunner: [name] Other Clinician: Referring Cleophus Mendonsa: Treating Iliyah Bui/Extender: Darlen Round in Treatment: 176 Wound Status Wound Number: 67 Primary Diabetic Wound/Ulcer of the Lower Extremity Etiology: Wound Location: Right, Medial Lower Leg Wound Open Wounding Event: Gradually Appeared Status: Date Acquired: 05/02/2020 Comorbid Asthma, Hypertension, Peripheral Arterial Disease, Peripheral Weeks Of Treatment: 33 History: Venous Disease, Type II Diabetes, Gout, Osteoarthritis Clustered Wound: No Photos Photo Uploaded By: Sandre Kitty on 12/19/2020 16:52:01 Wound Measurements Length: (cm) 5.1 Width: (cm) 3.9 Depth: (cm) 0.1 Area: (cm) 15.622 Volume: (cm)  1.562 % Reduction in Area: -1004.8% % Reduction in Volume: -1007.8% Epithelialization: Small (1-33%) Tunneling: No Undermining: No Wound Description Classification: Grade 1 Wound Margin: Indistinct, nonvisible Exudate Amount: Medium Exudate Type: Serosanguineous Exudate Color: red, brown Foul Odor After Cleansing: No Slough/Fibrino No Wound Bed Granulation Amount: Large (67-100%) Exposed Structure Granulation Quality: Pink, Pale Fascia Exposed: No Necrotic Amount: Small (1-33%) Fat Layer (Subcutaneous Tissue) Exposed: Yes Necrotic Quality: Adherent Slough Tendon Exposed: No Muscle Exposed: No Joint Exposed: No Bone Exposed: No Treatment Notes Wound #67 (Lower Leg) Wound Laterality: Right, Medial Cleanser Peri-Wound Care Sween Lotion (Moisturizing lotion) Discharge Instruction: Apply moisturizing lotion to dry skin on legs Urea Cream 40%, tube, 1 (oz) Discharge Instruction: apply to lower legs with dressing changes Topical Primary Dressing Maxorb Extra Calcium Alginate 2x2 in Discharge Instruction: Apply calcium alginate to wound bed as instructed Secondary Dressing ABD Pad, 8x10 Discharge Instruction: Apply over primary dressing, also cover toes with ABD pad for closed dressing Zetuvit Plus 4x4 in Discharge Instruction: or equivalent extra absorbent pad.Apply over primary dressing as directed. Secured With Compression Wrap ThreePress (3 layer compression wrap) Discharge Instruction: Apply three layer compression as directed. Pad bend of ankle with foam or ABD pad. Compression Stockings Add-Ons Electronic Signature(s) Signed: 12/19/2020 5:07:06 PM By: Leane Call Entered By: Leane Call on 12/19/2020 12:25:36 -------------------------------------------------------------------------------- Vitals Details Patient Name: Date of Service: [name]. 12/19/2020 11:00 A M Medical Record Number: [id number] Patient Account Number: [id number] Date of  Birth/Sex: Treating RN: 1951-05-11 (70 y.o. Nita Sells Primary Care Sorin Frimpong: [name] Other Clinician: Referring Tyeisha Dinan: Treating Dawnya Grams/Extender: Darlen Round in Treatment: 176 Vital Signs Time Taken: 12:15 Temperature (F): 98.9 Height (in): 62 Pulse (bpm): 78 Weight (lbs): 335 Respiratory Rate (breaths/min): 18 Body Mass Index (BMI): 61.3 Blood Pressure (mmHg): 118/74 Reference Range: 80 - 120 mg / dl Airway Pulse Oximetry (%): 93 Electronic Signature(s) Signed: 12/19/2020 5:07:06 PM By: Leane Call Entered By: Leane Call on 12/19/2020 12:22:50

## 2020-12-28 ENCOUNTER — Telehealth: Payer: Self-pay | Admitting: Endocrinology

## 2020-12-28 NOTE — Telephone Encounter (Signed)
See below

## 2020-12-28 NOTE — Telephone Encounter (Signed)
FYI  [facility] called to report pt's BS that have been running high the past couple days. Today it was 350 and now the nurse states it is 274. She wanted Dr.Ellison to be aware.

## 2021-01-02 ENCOUNTER — Encounter (HOSPITAL_BASED_OUTPATIENT_CLINIC_OR_DEPARTMENT_OTHER): Payer: Medicare PPO | Admitting: Physician Assistant

## 2021-01-02 ENCOUNTER — Other Ambulatory Visit: Payer: Self-pay

## 2021-01-02 DIAGNOSIS — E11621 Type 2 diabetes mellitus with foot ulcer: Secondary | ICD-10-CM | POA: Diagnosis not present

## 2021-01-02 NOTE — Progress Notes (Addendum)
[name], [name] ([id number]) Visit Report for 01/02/2021 Chief Complaint Document Details Patient Name: Date of Service: [name], [name] 01/02/2021 10:45 A M Medical Record Number: [id number] Patient Account Number: [id number] Date of Birth/Sex: Treating RN: [birth year] (70 y.o. [name] Primary Care Provider: [name] Other Clinician: Referring Provider: Treating Provider/Extender: Darlen Round in Treatment: [phone] Information Obtained from: Patient Chief Complaint Bilateral reoccurring LE ulcers Electronic Signature(s) Signed: 01/02/2021 11:01:49 AM By: [name] PA-C Entered By: [name] on 01/02/2021 11:01:49 -------------------------------------------------------------------------------- HPI Details Patient Name: Date of Service: [name]. 01/02/2021 10:45 A M Medical Record Number: [id number] Patient Account Number: [id number] Date of Birth/Sex: Treating RN: Jan 16, 1951 (70 y.o. [name] Primary Care Provider: [name] Other Clinician: Referring Provider: Treating Provider/Extender: Darlen Round in Treatment: 57 History of Present Illness HPI Description: this patient has been seen a couple of times before and returns with recurrent problems to her right and left lower extremity with swelling and weeping ulcerations due to not wearing her compression stockings which she had been advised to do during her last discharge, at the end of June 2018. During her last visit the patient had had normal arterial blood flow and her venous reflux study did not necessitate any surgical intervention. She was recommended compression and elevation and wound care. After prolonged treatment the patient was completely healed but she has been noncompliant with wearing or compressions.. She was here last week with an outpatient return visit planned but the patient came in a very poor general condition with altered  mental status and was rushed to the ER on my request. With a history of hypertension, diabetes, TIA and right-sided weakness she was set up for an MRI on her brain and cervical spine and was sent to [facility]. Getting an MRI done was very difficult but once the workup was done she was found not to have any spinal stenosis, epidural abscess or hematoma or discitis. This was radiculopathy to be treated as an outpatient and she was given a follow-up appointment. Today she is feeling much better alert and oriented and has come to reevaluate her bilateral lower extremity lymphedema and ulceration 03/25/2017 -- she was admitted to the hospital on 03/16/2017 and discharged on 03/18/2017 with left leg cellulitis and ulceration. She was started on vancomycin and Zosyn and x-ray showed no bony involvement. She was treated for a cellulitis with IV antibiotics changed to Rocephin and Flagyl and was discharged on oral Keflex and doxycycline to complete a 7 day course. Last hemoglobin A1c was 7.1 and her other ailments including hypertension got asthma were appropriately treated. 05/06/2017 -- she is awaiting the right size of compression stockings from [place] but other than that has been doing well. ====== Old notes 70 year old patient was seen one time last October and was lost to follow-up. She has recurrent problems with weeping and ulceration of her left lower extremity and has swelling of this for several years. It has been worse for the last 2 months. Past medical history is significant for diabetes mellitus type 2, hypertension, gout, morbid obesity, depressive disorders, hiatal hernia, migraines, status post knee surgery, risk of a cholecystectomy, vaginal hysterectomy and breast biopsy. She is not a smoker. As noted before she has never had a venous duplex study and an arterial ABI study was attempted but the left lower extremity was noncompressible 10/01/2016 -- had a lower extremity venous duplex  reflux evaluation which showed  no evidence of deep vein reflux in the right or left lower extremity, and no evidence of great saphenous vein reflux more than 500 ms in the right or left lower extremity, and the left small saphenous vein is incompetent but no vascular consult was recommended. review of her electronic medical records noted that the ABI was checked in July 2017 where the right ABI was normal limits and the left ABI could not be ascertained due to pain with cuff pressure but the waveforms are within normal limits. her arterial duplex study scheduled for April 27. 10/08/2016 -- the patient has various reasons for not having a compression on and for the last 3 days she has had no compression on her left lower extremity either due to pain or the lack of nursing help. She does not use her juxta lites either. 10/15/2016 -- the patient did not keep her appointment for arterial duplex study on April 27 and I have asked her to reschedule this. Her pain is out of proportion with the physical findings and she continuously fails to wear a compression wraps and cuts them off because she says she cannot tolerate the pain. She does not use her juxta lites either. 10/22/2016 -- he has rescheduled her arterial duplex study to May 21 and her pain today is a bit better. She has not been wearing her juxta lites on her right lower extremity but now understands that she needs to do this. She did tolerate the to press compression wrap on her left lower extremity 10/29/2016 --arterial duplex study is scheduled for next week and overall she has been tolerating her compression wraps and also using her juxta lites on her right lower extremity 11/05/2016 -- the right ABI was 0.95 the left was 1.03. The digit TBI is on the right was 0.83 on the left was 0.92 and she had biphasic flow through these vessels. The impression was that of normal lower extremity arterial study. 11/12/2016 -- her pain is minimal and she  is doing very well overall. 11/26/2016 -- she has got juxta lites and her insurance will not pay for additional dual layer compression stockings. She is going to order some from [place]. 05/12/2017 -- her juxta lites are very old and too big for her and these have not been helping with compression. She did get 20-30 mm compression stockings from [place] but she and her husband are unable to put these on. I believe she will benefit from bilateral Extremit-ease, compression stockings and we will measure her for these today. 05/20/2017 -- lymphedema on the left lower extremity has increased a lot and she has a open ulceration as a result of this. The right lower extremity is looking pretty good. She has decided to by the compression stockings herself and will get reimbursed by the home health, at a later date. 05/27/2017 -- her sciatica is bothering her a lot and she thought her left leg pain was caused due to the compression wrap and hence removed it and has significant lymphedema. There is no inflammation on this left lower extremity. 06/17/17 on evaluation today patient appears to be doing very well and in fact is completely healed in regard to her ulcerations. Unfortunately however she does have continued issues with lymphedema nonetheless. We did order compression garments for her unfortunately she states that the size that she received were large although we ordered medium. Obviously this means she is not getting the optimal compression. She does not have those with her today and therefore  we could not confirm and contact the company on her behalf. Nonetheless she does state that she is going to have her husband bring them by tomorrow so that we can verify and then get in touch with the company. No fevers, chills, nausea, or vomiting noted at this time. Overall patient is doing better otherwise and I'm pleased with the progress she has made. 07/01/17 on evaluation today patient appears to be doing  very well in regard to her bilateral lower extremity she does not have any openings at this point which is excellent news. Overall I'm pleased with how things have progressed up to this time. Since she is doing so well we did order her compression which we are seeing her today to ensure that it fits her properly and everything is doing well in that regard and then subsequently she will be discharged. ============ Old Notes: 03/31/16 patient presents today for evaluation concerning open wounds that she has over the left medial ankle region as well as the left dorsal foot. She has previously had this occur although it has been healed for a number of months after having this for about a year prior until her hospitalization on 01/05/16. At that point in time it appears that she was admitted to the hospital for left lower extremity cellulitis and was placed on vancomycin and Zosyn at that point. Eventually upon discharge on January 15, 2016 she was placed on doxycycline at that point in time. Later on 03/27/16 positive wound culture growing Escherichia coli this was switched to amoxicillin. Currently she tells me that she is having pain radiated to be a 7 out of 10 which can be as high as 10 out of 10 with palpation and manipulation of the wound. This wound appears to be mainly venous in nature due to the bilateral lower extremity venous stasis/lymphedema. This is definitely much worse on her left than the right side. She does have type 1 diabetes mellitus, hypertension, morbid obesity, and is wheelchair dependent.during the course of the hospital stay a blood culture was also obtained and fortunately appeared negative. She also had an x-ray of the tibia/fibula on the left which showed no acute bone abnormality. Her white blood cell count which was performed last on 03/25/16 was 7.3, hemoglobin 12.8, protein 7.1, albumin 3.0. Her urine culture appeared to be negative for any specific organisms. Patient did  have a left lower extremity venous duplex evaluation for DVT . This did not include venous reflux studies but fortunately was negative for DVT Patient also had arterial studies performed which revealed that she had a . normal ABI on the right though this was unable to be performed on the left secondary to pain that she was having around the ankle region due to the wound. However it was stated on report that she had biphasic pulses and apparently good blood flow. ========== 06/03/17 she is here in follow-up evaluation for right lower extremity ulcer. The right lower sure he has healed but she has reopened to the left medial malleolus and dorsal foot with weeping. She is waiting for new compression garments to arrive from home health, the previous compression garments were ill fitting. We will continue with compression bilaterally and follow-up in 2 weeks Readmission: 08/05/17 on evaluation today patient appears to be doing somewhat poorly in regard to her left lower extremity especially although the right lower extremity has a small area which may no longer be open. She has been having a lot of drainage from the  left lower extremity however he tells me that she has not been able to use the EXTREMIT-EASE Compression at this point. She states that she did better and was able to actually apply the Juxta-Lite compression although the wound that she has is too large and therefore really does not compress which is why she cannot wear it at this point. She has no one who can help her put it on regular basis her son can sometimes but he's not able to do it most of the time. I do believe that's why she has begun to weave and have issues as she is currently yet again. No fevers, chills, nausea, or vomiting noted at this time. Patient is no evidence of dementia. 08/12/17 on evaluation today patient appears to still be doing fairly well in regard to the draining areas/weeping areas at this point. With that being  said she unfortunately did go to the ER yesterday due to what was felt to be possibly a cellulitis. They place her on doxycycline by mouth and discharge her home. She definitely was not admitted. With that being said she states she has had more discomfort which has been unusual for her even compared to prior times and she's had infections.08/12/17 on evaluation today patient appears to still be doing fairly well in regard to the draining areas/weeping areas at this point. With that being said she unfortunately did go to the ER yesterday due to what was felt to be possibly a cellulitis. They place her on doxycycline by mouth and discharge her home. She definitely was not admitted. With that being said she states she has had more discomfort which has been unusual for her even compared to prior times and she's had infections. 08/19/17 put evaluation today patient tells me that she's been having a lot of what sounds to be neuropathic type pain in regard to her left lower extremity. She has been using over-the-counter topical bins again which some believe. That in order to apply the she actually remove the wrap we put on her last Wednesday on Thursday. Subsequently she has not had anything on compression wise since that time. The good news is a lot of the weeping areas appear to have closed at this point again I believe she would do better with compression but we are struggling to get her to actually use what she needs to at this point. No fevers, chills, nausea, or vomiting noted at this time. 09/03/17 on evaluation today patient appears to be doing okay in regard to her lower extremities in regard to the lymphedema and weeping. Fortunately she does not seem to show any signs of infection at this point she does have a little bit of weeping occurring in the right medial malleolus area. With that being said this does not appear to be too significant which is good news. 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 She has severe skin damage secondary to both of these features involving the dorsal left foot and medial left ankle and lower leg. Still has open areas in the left anterior foot. The area on the right closed over. She uses her own juxta light stockings. She does not have an arterial issue 09/16/17 on evaluation today patient actually appears to be doing excellent in regard to her bilateral lower extremity swelling. The Juxta-Lite compression wrap seem to be doing very well for her. She has not however been using the portion that goes over her foot.  Her left foot still is draining a little bit not nearly as significant as it has been in the past but still I do believe that she likely needs to utilize the full wrap including the foot portion of this will improve as well. She also has been apparently putting on a significant amount of Vaseline which also think is not helpful for her. I recommended that if she feels she needs something for moisturizer Eucerin will probably be better. 09/30/17 on evaluation today patient presents with several new open areas in regard to her left lower extremity although these appear to be minimal and mainly seem to be more moisture breakdown than anything. Fortunately she does not seem to have any evidence of infection which is great news. She has been tolerating the dressing changes without complication we are using silver alginate on the foot she has been using AB pads to have the legs and using her Juxta- Lite compression which seems to be controlling her swelling very well. Overall I'm pleased with the poor way she has progressed. 10/14/17 on evaluation today patient appears to be doing better in regard to her left lower extremity areas of weeping. She does still have some discomfort although in general this does not appear to be as macerated and I think it is progressing nicely. I do think she still  needs to wear the foot portion of her Juxta- Lite in order to get the most benefit from the wrap obviously. She states she understands. Fortunately there does not appear to be evidence of infection at this time which is great news. 10/28/17 on evaluation today patient appears to be doing excellent in regard to her left lower extremity. She has just a couple areas that are still open and seem to be causing any trouble whatsoever. For that reason I think that she is definitely headed in the right direction the spots are very tiny compared to what we have been dealing with in the past. 11/11/17 on evaluation today patient appears to have a right lateral lower extremity ulcer that has opened since I last saw her. She states this is where the home health nurse that was coming out remove the dressing without wetting the alginate first. Nonetheless I do not know if this is indeed the case or not but more importantly we have not ordered home help to be coming out for her wounds at all. I'm unsure as to why they are coming out and we're gonna have to check on this and get things situated in that regard. With that being said we currently really do not need them to be coming out as the patient has been taking care of her leg herself without complication and no issues. In fact she was doing much better prior to nursing coming out. 11/25/17 on evaluation today patient actually appears to be doing fairly well in regard to her left lower extremity swelling. In fact she has very little area of weeping at this point there's just a small spot on the lateral portion of her right leg that still has me just a little bit more concerned as far as wanting to see this clear up before I discharge her to caring for this at home. Nonetheless overall she has made excellent progress. 12/09/17 on evaluation today patient appears to be doing rather well in regard to her lower extremity edema. She does have some weeping still in the left  lower extremity although the big area we were taking care of two  weeks ago actually has closed and she has another area of weeping on the left lower extremity immediately as well is the top of her foot. She does not currently have lymphedema pumps she has been wearing her compression daily on a regular basis as directed. With that being said I think she may benefit from lymphedema pumps. 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 We had a very difficult time getting and keeping this under control. 12/23/17 on evaluation today patient actually appears to be doing a little bit more poorly in regard to her bilateral lower extremities. She has been tolerating the Juxta-Lite compression wraps. Unfortunately she has two new ulcers on the right lower extremity and left lower Trinity ulceration seems to be larger. Obviously this is not good news. She has been tolerating the dressings without complication. 12/30/17 on evaluation today patient actually appears to be doing much better in regard to her bilateral lower extremity edema. She continues to have some issues with ulcerations and in fact there appears to be one spot on each leg where the wrap may have caused a little bit of a blister which is subsequently opened up at this point is given her pain. Fortunately it does not appear to be any evidence of infection which is good news. No fevers chills noted. 01/13/18 on evaluation today patient appears to be doing rather well in regard to her bilateral lower extremities. The dressings did get kind of stuck as far as the wound beds are concerned but again I think this is mainly due to the fact that she actually seems to be showing signs of healing which is good news. She's not having as much drainage therefore she was having more of the dressing sticking.  Nonetheless overall I feel like her swelling is dramatically down compared to previous. 01/20/18 on evaluation today patient unfortunately though she's doing better in most regards has a large blister on the left anterior lower extremity where she is draining quite significantly. Subsequently this is going to need debridement today in order to see what's underneath and ensure she does not continue to trapping fluid at this location. Nonetheless No fevers, chills, nausea, or vomiting noted at this time. 01/27/18 on evaluation today patient appears to be doing rather well at this point in regard to her right lower extremity there's just a very small area that she still has open at this point. With that being said I do believe that she is tolerating the compression wraps very well in making good progress. Home health is coming out at this point to see her. Her left lower extremity on the lateral portion is actually what still mainly open and causing her some discomfort for the most part 02/10/18 on evaluation today patient actually appears to be doing very well in regard to her right lower extremity were all the ulcers appear to be completely close. In regard to the left lower extremity she does have two areas still open and some leaking from the dorsal surface of her foot but this still seems to be doing much better to me in general. 02/24/18 on evaluation today patient actually appears to be doing much better in regard to her right lower extremity this is still completely healed. Her left lower extremity is also doing much better fortunately she has no evidence of infection. The one  area that is gonna require some debridement is still on the left anterior shin. Fortunately this is not hurting her as badly today. 03/10/18 on evaluation today patient appears to be doing better in some regards although she has a little bit more open area on the dorsal foot and she also has some issues on the medial portion of  the left lower extremity which is actually new and somewhat deep. With that being said there fortunately does not appear to be any significant signs of infection which is good news. No fevers, chills, nausea, or vomiting noted at this time. In general her swelling seems to be doing fairly well which is good news. 03/31/18 on evaluation today patient presents for follow-up concerning her left lower extremity lymphedema. Unfortunately she has been doing a little bit more poorly since I last saw her in regard to the amount of weeping that she is experiencing. She's also having some increased pain in the anterior shin location. Unfortunately I do not feel like the patient is making such good progress at this point a few weeks back she was definitely doing much better. 04/07/18 on evaluation today patient actually appears to be showing some signs of improvement as far as the left lower extremity is concerned. She has been tolerating the dressing changes and it does appear that the Drawtex did better for her. With that being said unfortunately home health is stating that they cannot obtain the Drawtex going forward. Nonetheless we're gonna have to check and see what they may be able to get the alginate they were using was getting stuck in causing new areas of skin being pulled all that with and subsequently weep and calls her to worsen overall this is the first time we've seen improvement at this time. 04/14/18 on evaluation today patient actually appears to be doing rather well at this point there does not appear to be any evidence of infection at this time and she is actually doing excellent in regard to the weeping in fact she almost has no openings remaining even compared to just last week this is a dramatic improvement. No fevers chills noted 04/21/18 evaluation today patient actually appears to be doing very well. She in fact is has a small area on the posterior lower extremity location and she has  a small area on the dorsal surface of her foot that are still open both of which are very close to closing. We're hoping this will be close shortly. She brought her Juxta-Lite wrap with her today hoping that would be able to put her in it unfortunately I don't think were quite at that point yet but we're getting closer. 04/28/18 upon evaluation today patient actually appears to be doing excellent in regard to her left lower extremity ulcer. In fact the region on the posterior lower extremity actually is much smaller than previously noted. Overall I'm very happy with the progress she has made. She again did bring her Juxta-Lite although we're not quite ready for that yet. 05/11/18 upon evaluation today patient actually appears to be doing in general fairly well in regard to her left lower Trinity. The swelling is very well controlled. With that being said she has a new area on the left anterior lower extremity as well as between the first and second toes of her left foot that was not present during the last evaluation. The region of her posterior left lower extremity actually appears to be almost completely healed. T be honest I'm very pleased  with o the way that stands. Nonetheless I do believe that the lotion may be keeping the area to moist as far as her legs are concerned subsequently I'm gonna consider discontinuing that today. 05/26/18 on evaluation today patient appears to be doing rather well in regard to her left lower should be ulcers. In fact everything appears to be close except for a very small area on the left posterior lower extremity. Fortunately there does not appear to be any evidence of infection at this time. Overall very pleased with her progress. 06/02/18 and evaluation today patient actually appears to be doing very well in regard to her lower extremity ulcers. She has one small area that still continues to weep that I think may benefit her being able to justify lotion and user  Juxta-Lite wraps versus continued to wrap her. Nonetheless I think this is something we can definitely look into at this point. 06/23/18 on evaluation today patient unfortunately has openings of her bilateral lower extremities. In general she seems to be doing much worse than when I last saw her just as far as her overall health standpoint is concerned. She states that her discomfort is mainly due to neuropathy she's not having any other issues otherwise. No fevers, chills, nausea, or vomiting noted at this time. 06/30/18 on evaluation today patient actually appears to be doing a little worse in regard to her right lower extremity her left lower extremity of doing fairly well. Fortunately there is no sign of infection at this time. She has been tolerating the dressing changes without complication. Home health did not come out like they were supposed to for the appropriate wrap changes. They stated that they never received the orders from [place] which were fax. Nonetheless we will send a copy of the orders with the patient today as well. 07/07/18 on evaluation today patient appears to be doing much better in regard to lower extremities. She still has several openings bilaterally although since I last saw her her legs did show obvious signs of infection when she later saw her nurse. Subsequently a culture was obtained and she is been placed on Bactrim and Keflex. Fortunately things seem to be looking much better it does appear she likely had an infection. Again last week we'd even discussed it but again there really was not any obvious sign that she had infection therefore we held off on the antibiotics. Nonetheless I'm glad she's doing better today. 07/14/18 on evaluation today patient appears to be doing much better regarding her bilateral lower Trinity's. In fact on the right lower for me there's nothing open at this point there are some dry skin areas at the sites where she had infection. Fortunately there is  no evidence of systemic infection which is excellent news. No fevers chills noted 07/21/18 on evaluation today patient actually appears to be doing much better in regard to her left lower extremity ulcers. She is making good progress and overall I feel like she's improving each time I see her. She's having no pain I do feel like the infection is completely resolved which is excellent news. No fevers, chills, nausea, or vomiting noted at this time. 07/28/18 on evaluation today patient appears to be doing very well in regard to her left lower [employer]. Everything seems to be showing signs of improvement which is excellent news. Overall very pleased with the progress that has been made. Fortunately there's no evidence of active infection at this time also excellent news. 08/04/18 on evaluation  today patient appears to be doing more poorly in regard to her bilateral lower extremities. She has two new areas open up on the right and these were completely closed as of last week. She still has the two spots on the left which in my pinion seem to be doing better. Fortunately there's no evidence of infection again at this point. 08/11/18 on evaluation today patient actually appears to be doing very well in regard to her bilateral lower Trinity wounds that all seem to be doing better and are measures smaller today. Fortunately there's no signs of infection. No fevers, chills, nausea, or vomiting noted at this time. 08/18/18 on evaluation today patient actually appears to be doing about the same inverter bilateral lower extremities. She continues to have areas that blistering open as was drain that fortunately nothing too significant. Overall I feel like Drawtex may have done better for her however compared to the collagen. 08/25/18 on evaluation today patient appears to be doing a little bit more poorly today even compared to last time I saw her. Again I'm not exactly sure why she's making worse progress over  the past several weeks. I'm beginning to wonder if there is some kind of underlying low level infection causing this issue. I did actually take a culture from the left anterior lower extremity but it was a new wound draining quite a bit at this point. Unfortunately she also seems to be having more pain which is what also makes me worried about the possibility of infection. This is despite never erythema noted at this point. 09/01/18 on evaluation today patient actually appears to be doing a little worse even compared to last week in regard to bilateral lower extremities. She did go to the hospital on the 16th was given a dose of IV Zosyn and then discharged with a recommendation to continue with the Bactrim that I previously prescribed for her. Nonetheless she is still having a lot of discomfort she tells me as well at this time. This is definitely unfortunate. No fevers, chills, nausea, or vomiting noted at this time. 09/08/18 on evaluation today patient's bilateral lower extremities actually appear to be shown signs of improvement which is good news. Fortunately there does not appear to be any signs of active infection I think the anabiotic is helping in this regard. Overall I'm very pleased with how she is progressing. 09/15/18 patient was actually seen in ER yesterday due to her legs as well unfortunately. She states she's been having a lot of pain and discomfort as well as a lot of drainage. Upon inspection today the patient does have a lot of swelling and drainage I feel like this is more related to lymphedema and poor fluid control than it is to infection based on what I'm seeing. The physician in the emergency department also doubted that the patient was having a significant infection nonetheless I see no evidence of infection obvious at this point although I do see evidence of poor fluid control. She still not using a compression pumps, she is not elevating due to her lift chair as well as her  hospital bed being broken, and she really is not keeping her legs up as much as they should be and also has been taking off her wraps. All this combined I think has led to poor fluid control and to be honest she may be somewhat volume overloaded in general as well. I recommend that she may need to contact your physician to see if a  prescription for a diuretic would be beneficial in their opinion. As long as this is safe I think it would likely help her. 09/29/18 on evaluation today patient's left lower extremity actually appears to be doing quite a bit better. At least compared to last time that I saw her. She still has a large area where she is draining from but there's a lot of new skin speckled trout and in fact there's more new skin that there are open areas of weeping and drainage at this point. This is good news. With regard to the right lower extremity this is doing much better with the only open area that I really see being a dry spot on the right lateral ankle currently. Fortunately there's no signs of active infection at this time which is good news. No fevers, chills, nausea, or vomiting noted at this time. The patient seems somewhat stressed and overwhelmed during the visit today she was very lethargic as such. She does and she is not taking any pain medications at this point. Apparently according to her husband are also in the process of moving which is probably taking its toll on her as well. 10/06/18 on evaluation today patient appears to be doing rather well in regard to her lower extremities compared to last evaluation. Fortunately there's no signs of active infection. She tells me she did have an appointment with her primary. Nonetheless he was concerned that the wounds were somewhat deep based on pictures but we never actually saw her legs. She states that he had her somewhat worried due to the fact that she was fearing now that she was [place] have to have an amputation. With that being  said based on what I'm seeing check she looks better this week that she has the last two times I've seen her with much less drainage I'm actually pleased in this regard. That doesn't mean that she's out of the water but again I do not think what the point of talking about education at all in regard to her leg. She is very happy to hear this. She is also not having as much pain as she was having last week. 10/13/18 unfortunately on evaluation today patient still continues to have a significant amount of drainage she's not letting home health actually apply the compression dressings at this point. She's trying to use of Juxta-Lite of the top of Kerlex and the second layer of the three layer compression wrap. With that being said she just does not seem to be making as good a progress as I would expect if she was having the compression applied and in place on a regular basis. No fevers, chills, nausea, or vomiting noted at this time. 10/20/18 on evaluation today patient appears to be doing a little better in regard to her bilateral lower extremity ulcers. In fact the right lower extremity seems to be healed she doesn't even have any openings at this point left lower extremity though still somewhat macerated seems to be showing signs of new skin growth at multiple locations throughout. Fortunately there's no evidence of active infection at this time. No fevers, chills, nausea, or vomiting noted at this time. 10/27/18 on evaluation today patient appears to be doing much better in regard to her left lower Trinity ulcer. She's been tolerating the laptop complication and has minimal drainage noted at this point. Fortunately there's no signs of active infection at this time. No fevers, chills, nausea, or vomiting noted at this time. 11/03/18 on evaluation today  patient actually appears to be doing excellent in regard to her left lower extremity. She is having very little drainage at this point there does not appear  to be any significant signs of infection overall very pleased with how things have gone. She is likewise extremely pleased still and seems to be making wonderful progress week to week. I do believe antibiotics were helpful for her. Her primary care provider did place on amateur clean since I last saw her. 11/17/18 on evaluation today patient appears to be doing worse in regard to her bilateral lower extremities at this point. She is been tolerating the dressing changes without complication. With that being said she typically takes the Coban off fairly quickly upon arriving home even after being seen here in the clinic and does not allow home health reapply command as part of the dressing at home. Therefore she said no compression essentially since I last saw her as best I can tell. With that being said I think it shows and how much swelling she has in the open wounds that are noted at this point. Fortunately there's no signs of infection but unfortunately if she doesn't get this under control I think she will end up with infection and more significant issues. 11/24/18 on evaluation today patient actually appears to be doing somewhat better in regard to her bilateral lower extremities. 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 Nonetheless that is doing better currently so she should be able to attempt reinitiating the lymphedema pumps at this time. No fevers, chills, nausea, or vomiting noted at this time. 12/01/18 upon evaluation today patient's left lower extremity appears to be doing quite well unfortunately her right lower extremity is not doing nearly as well. She has been tolerating the dressing changes without complication unfortunately she did not keep a wrap on the right at this time. Nonetheless I believe this has led to increased swelling and weeping in  the world is actually much larger than during the last evaluation with her. 12/08/18 on evaluation today patient appears to be doing about the same at this point in regard to her right lower extremity. There is some more palatable to touch I'm concerned about the possibility of there being some infection although I think the main issue is she's not keeping her compression wrap on which in turn is not allowing this area to heal appropriately. 12/22/18 on evaluation today patient appears to be doing better in regard to left lower extremity unfortunately significantly worse in regard to the right lower extremity. The areas of blistering and necrotic superficial tissue have spread and again this does not really appear to be signs of infection and all she just doesn't seem to be doing nearly as well is what she has been in the past. Overall I feel like the Augmentin did absolutely nothing for her she doesn't seem to have any infection again I really didn't think so last time either is more of a potential preventative measure and hoping that this would make some difference but I think the main issue is she's not wearing her compression. She tells me she cannot wear the [place] been we put on she takes it off pretty much upon getting home. Subsequently she worshiped Juxta-Lite when I questioned her about how often she wears it this is no  more than three hours a day obviously that leaves 21 hours that she has no compression and this is obviously not doing well for her. Overall I'm concerned that if things continue to worsen she is at great risk of both infection as well as losing her leg. 01/05/19 on evaluation today patient appears to be doing well in regard to her left lower extremity which he is allowing [place] to wrap and not so well with regard to her right lower extremity which she is not allowing [place] to really wrap and keep the wrap on. She states that it hurts too badly whenever it's wrapped and she ends up  having to take it off. She's been using the Juxta-Lite she tells me up to six hours a day although I question whether or not that's really been the case to be honest. Previously she told me three hours today nonetheless obviously the legs as long as the wrap is doing great when she is not is doing much more poorly. 01/12/2019 on evaluation today patient actually appears to be doing a little better in my opinion with regard to her right lower extremity ulcer. She has a small open area on the left lower extremity unfortunately but again this I think is part of the normal fluctuation of what she is going to have to expect with regard to her legs especially when she is not using her lymphedema pumps on a regular basis. Subsequently based on what I am seeing today I think that she does seem to be doing slightly better with regard to her right lower extremity she did see her primary care provider on Monday they felt she had an infection and placed her on 2 antibiotics. Both Cipro and clindamycin. Subsequently again she seems possibly to be doing a little bit better in regards to the right lower extremity she also tells me however she has been wearing the compression wrap over the past week since I spoke with her as well that is a Kerlix and Coban wrap on the right. No fevers, chills, nausea, vomiting, or diarrhea. 01/19/2019 on evaluation today patient appears to be doing better with regard to her bilateral lower extremities especially the right. I feel like the compression has been beneficial for her which is great news. She did get a call from her primary care provider on her way here today telling her that she did have methicillin- resistant Staphylococcus aureus and he was calling in a couple new antibiotics for her including a ointment to be applied she tells me 3 times a day. With that being said this sounds like likely to be Bactroban which I think could be applied with each dressing/wrap change but I  would not be able to accommodate her applying this 3 times a day. She is in agreement with the least doing this we will add that to her orders today. 01/26/2019 on evaluation today patient actually appears to be doing much better with regard to her right lower extremity. Her left lower extremity is also doing quite well all things considering. Fortunately there is no evidence of active infection at this time. No fevers, chills, nausea, vomiting, or diarrhea. 02/02/2019 on evaluation today patient appears to be doing much better compared to her last evaluation. Little by little off like her right leg is returning more towards normal. There does not appear to be any signs of active infection and overall she seems to be doing quite well which is great news. I am very pleased  in this regard. No fevers, chills, nausea, vomiting, or diarrhea. 02/09/2019 upon evaluation today patient appears to be doing better with regard to her bilateral lower extremities. She has been tolerating the dressing changes without complication. Fortunately there is no signs of active infection at this time. No fevers, chills, nausea, vomiting, or diarrhea. 02/23/2019 on evaluation today patient actually appears to be doing quite well with regard to her bilateral lower extremities. She has been tolerating the dressing changes without complication. She is even used her pumps one time and states that she really felt like it felt good. With that being said she seems to be in good spirits and her legs appear to be doing excellent. 03/09/2019 on evaluation today patient appears to be doing well with regard to her right lower extremity there are no open wounds at this time she is having some discomfort but I feel like this is more neuropathy than anything. With regard to her left lower extremity she had several areas scattered around that she does have some weeping and drainage from but again overall she does not appear to be having any  significant issues and no evidence of infection at this time which is good news. 03/23/2019 on evaluation today patient appears to be doing well with regard to her right lower extremity which she tells me is still close she is using her juxta light here. Her left lower extremity she mainly just has an area on the foot which is still slightly draining although this also is doing great. Overall very pleased at this time. 04/06/2019 patient appears to be doing a little bit worse in regard to her left lower extremity upon evaluation today. She feels like this could be becoming infected again which she had issues with previous. Fortunately there is no signs of systemic infection but again this is always a struggle with her with her legs she will go from doing well to not so well in a very short amount of time. 04/20/2019 on evaluation today patient actually appears to be doing quite well with regard to her right lower extremity I do not see any signs of active infection at this time. Fortunately there is no fever chills noted. She is still taking the antibiotics which I prescribed for her at this point. In regard to the left lower extremity I do feel like some of these areas are better although again she still is having weeping from several locations at this time. 04/27/2019 on evaluation today patient appears to be doing about the same if not slightly worse in regard to her left lower extremity ulcers. She tells me when questioned that she has been sleeping in her Hoveround chair in fact she tells me she falls asleep without even knowing it. I think she is spending a whole lot of time in the chair and less time walking and moving around which is not good for her legs either. On top of that she is in a seated position which is also the worst position she is not really elevating her legs and she is also not using her lymphedema pumps. All this is good to contribute to worsening of her condition in  general. 05/18/2019 on evaluation today patient appears to be doing well with regard to her lower extremity on the right in fact this is showing no signs of any open wounds at this time. On the left she is continuing to have issues with areas that do drain. Some of the regions have healed and  there are couple areas that have reopened. She did go to the ER per the patient according to recommendations from the home health nurse due to what she was seen when she came out on 05/13/2019. Subsequently she felt like the patient needed to go to the hospital due to the fact that again she was having "milky white discharge" from her leg. Nonetheless she had and then was placed on doxycycline and subsequently seems to be doing better. 06/01/2019 upon evaluation today patient appears to be doing really in my opinion about the same. I do not see any signs of active infection which is good news. Overall she still has wounds over the bilateral lower extremities she has reopened on the right but this appears to be more of a crack where there is weeping/edema coming from the region. I do not see any evidence of infection at either site based on what I visualized today. 07/13/2019 upon evaluation today patient appears to be doing a little worse compared to last time I saw her. She since has been in the hospital from 06/21/2019 through 06/29/2019. This was secondary to having Covid. During that time they did apply lotion to her legs which unfortunately has caused her to develop a myriad of open wounds on her lower extremities. Her legs do appear to be doing better as far as the overall appearance is concerned but nonetheless she does have more open and weeping areas. 07/27/2019 upon evaluation today patient appears to be doing more poorly to be honest in regard to her left lower extremity in particular. There is no signs of systemic infection although I do believe she may have local infection. She notes she has been having  a lot of blue/green drainage which is consistent potentially with Pseudomonas. That may be something that we need to consider here as well. The doxycycline does not seem to have been helping. 08/03/2019 upon evaluation today patient appears to be doing a little better in my opinion compared to last week's evaluation. Her culture I did review today and she is on appropriate medications to help treat the Enterobacter that was noted. Overall I feel like that is good news. With that being said she is unfortunately continuing to have a lot of drainage and though it is doing better I still think she has a long ways to go to get things dried up in general. Fortunately there is no signs of systemic infection. 08/10/2019 upon evaluation today patient appears to be doing may be slightly better in regard to her left lower extremity the right lower extremity is doing much better. Fortunately there is no signs of infection right now which is good news. No fevers, chills, nausea, vomiting, or diarrhea. 08/24/2019 on evaluation today patient appears to be doing slightly better in regard to her lower extremities. The left lower extremity seems to be healed the right lower extremity is doing better though not completely healed as far as the openings are concerned. She has some generalized issues here with edema and weeping secondary to her lymphedema though again I do believe this is little bit drier compared to prior weeks evaluations. In general I am very pleased with how things seem to be progressing. No fevers, chills, nausea, vomiting, or diarrhea. 08/31/2019 upon evaluation today patient actually seems to making some progress here with regard to the left lower extremity in particular. She has been tolerating the dressing changes without complication. Fortunately there is no signs of active infection at this time. No fevers,  chills, nausea, vomiting, or diarrhea. She did see Dr. Doren Custard and he did note that she did have  a issue with the left great saphenous vein and the small saphenous vein in the leg. With that being said he was concerned about the possibility of laser ablation not being extremely successful. He also mentioned a small risk of DVT associated with the procedure. However if the wounds do not continue to improve he stated that that would probably be the way to go. Fortunately the patient's legs do seem to be doing much better. 09/07/2019 upon evaluation today patient appears to be doing better with regard to her lower extremities. She has been tolerating the dressing changes without complication. With that being said she is showing signs of improvement and overall very pleased. There are some areas on her leg that I think we do need to debride we discussed this last week the patient is in agreement with doing that as long as it does not hurt too badly. 09/14/2019 upon evaluation today patient appears to be doing decently well with regard to her left lower extremity. She is not having near as much weeping as she has had in the past things seem to be drying up which is good news. There is no signs of active infection at this time. 09/21/19 upon evaluation today patient appears to be doing better in regard overall to her bilateral lower extremities. She again has less open than she did previous and each week I feel like this is getting better. Fortunately there is no signs of active infection at this time. No fevers, chills, nausea, vomiting, or diarrhea. 09/28/2019 upon evaluation today patient actually appears to be showing signs of improvement with regard to her left lower extremity. Unfortunately the right medial lower extremity around the ankle region has reopened to some degree but this appears to be minimal still which is good news. There is no signs of active infection at this time which is also good news. 10/12/2019 upon evaluation today patient appears to be doing okay with regard to her bilateral  lower extremities today. The right is a little bit worse then last evaluation 2 weeks ago. The left is actually doing a little better in my opinion. Overall there is no signs of active infection at this time that I see. Obviously that something we have to keep a close eye on she is very prone to this with the significant and multiple openings that she has over the bilateral lower extremities. 10/19/2019 upon evaluation today patient appears to be doing about the best that I have seen her in quite some time. She has been tolerating the dressing changes without complication. There does not appear to be any signs of active infection and overall I am extremely happy with the way her legs appeared. She is drying up quite nicely and overall is having less pain. 11/09/2019 upon evaluation today patient appears to be doing better in regard to her wounds. She seems to be drying up more and more each time I see her this is just taking a very long time. Fortunately there is no signs of active infection at this time. 11/23/2019 upon evaluation today patient actually appears to be doing excellent in regard to her lower extremities at this point compared to where she has been. Fortunately there is no signs of active infection at this time. She did go to the hospital last week for nausea and vomiting completely unrelated to her wounds. Fortunately she is doing better  she was given some Reglan and got better. She had associated abdominal pain but they never found out what was going on. 12/07/2019 upon evaluation today patient appears to be doing well for the most part in regard to her legs. She unfortunately has not been keeping the [place] portion of her wraps on therefore the compression has not really been sufficient for what it is supposed to be. Nonetheless she tells me that it just hurt too bad therefore she removed it. 12/21/2019 upon evaluation today patient actually appears to be doing quite well with regard to her  legs. I do feel like she has been making progress which is great news and overall there is no signs of active infection at this time. No fevers, chills, nausea, vomiting, or diarrhea. 01/04/2020 upon evaluation today patient presents for follow-up concerning her lower extremity edema bilaterally. She still has open wounds she has not been using her lymphedema pumps. She is also not been utilizing her compression wraps appropriately she tends to unwrap them, take them off, or states that they hurt. Obviously the reason they hurt is because her legs start to swell but the issue is if she would use her compression/lymphedema pumps regularly she would not swell and she would have the pain. Nonetheless she has not even picked them up once honestly over the past several months and may be even as much as in the past year based on my opinion and what have seen. She tells me today that after last week when I talked about this with her specifically actually that was 2 weeks ago that she "forgot". 8//21 on evaluation today patient appears to be doing a little better in regard to her legs bilaterally. Fortunately there is no signs of active infection at this time. She tells me that she used her lymphedema pumps all of one time over the past 2 weeks since I last saw her. She tells me that she has been too busy in order to continue to use these. 02/01/2020 on evaluation today patient appears to be doing some better in regard to her wounds in general in her legs. We felt the right was healed although is not completely it does appear to be doing better she tells me she has been using her lymphedema pumps that she has had this six times since I last saw her. Obviously the more she does that the better she would do my opinion 02/15/2020 upon evaluation today patient appears to be doing about the same in regard to her legs. She tells me that she is pumping I'm still not sure how much she does to be perfectly honest. However  even if she does a little bit here and there I guess that is better than nothing. Fortunately there is no sign of active infection at this time which is great news. No fevers, chills, nausea, vomiting, or diarrhea. 02/29/2020 on evaluation today patient actually appears to be doing quite well all things considered this week. She has been tolerating the dressing changes without complication. Fortunately there is no signs of active infection at this time. No fevers, chills, nausea, vomiting, or diarrhea. 03/14/2020 upon evaluation today patient appears to be doing really about the same in regard to her legs. There is no signs of improvement overall and she as noted from home health does not appear to be elevating her legs he can get into her lift chair. There is too much stuff piled up on it the patient tells me. She also  tells me she cannot really use her pumps effectively due to the fact that she cannot have any space to get them on. Finally she is also not really elevating her legs because she is not sleeping in her bed she is sleeping in her chair currently and again overall I think everything that she is done in combination has been exactly the wrong thing for what she needs for her legs. 04/04/2020 upon evaluation today patient appears to be doing well at this time with regard to her legs. She is actually been pumping, keeping her wraps on, and to be honest she seems to be doing dramatically better the right leg is excellent the left leg is also excellent and measuring much smaller than previous. 04/18/2020 upon evaluation today patient actually is continue to make good progress in regard to her lower extremities bilaterally. Everything is improving and less wet that has been in the past overall I am extremely pleased with where things stand and I think that she is making great progress. The patient tells me she still continue to use her compression pumps 05/02/2020 on evaluation today patient appears  to be doing well at this time in regard to her left leg which is showing signs of drying up. With that being said she does have a lot of lymphedema type crusty skin around the toes of her left foot and the right medial ankle which has opened at this point. Fortunately there is no signs of active infection systemically at this point or even locally for that matter. 05/23/2020 on evaluation today patient appears to be doing well with regard to her lower extremities. Fortunately there is no signs of active infection at this time. No fever chills noted. She has been very depressed however she tells me. 06/06/2020 patient came in today for evaluation in regard to her bilateral lower extremity ulcerations. With that being said she came in feeling okay and actually laughing and joking around with the staff checking her in. Subsequently however she had a coughing spell and following the coughing spell it was a dramatic conversion from being jovial and joking around to being extremely short of breath her vital signs actually dropped in regard to her blood pressure from around 175 to down around 433 for systolic and from around 96 diastolic down to around 70. With that being said she also accompanied this with an increase in her respiratory rate which was also quite significant. Nonetheless I actually upon going into see her was extremely worried we called EMS to have her transported to the ER for further evaluation and treatment. She continued until EMS got here to be extremely short of breath even on 6 L of oxygen. Her oxygen saturation did come up to 99% but overall it was only 95 before which was not terrible she did feel like the oxygen helped her feel somewhat better however. She has a history of asthma but again even the coughing spell really should not have done this degree of alteration in her demeanor from where she was just before to after the coughing spell. She tells [place] however she has been feeling  somewhat abnormal since Friday although she could not really pinpoint exactly what was going on. 06/27/2020 plan evaluation today patient appears to be doing okay in regard to her leg ulcers. She is really not showing a lot of improvement to be honest and she still has a lot of weeping. With that being said after I last saw her we called  EMS to take her to the hospital she actually did not go she went to see her primary care provider who according to the patient have not identified and read through the note states that everything checked out okay. Nonetheless the patient has continued to have bouts where she gets very short of breath for seemingly no reason whatsoever. That happened even the same night that I saw her last time. This was after she refused transport to the hospital. With that being said she also tells me that just getting dressed to come here is quite a chore which is putting on her shirt causing her to have to stop to catch her breath. Obviously this is not normal and I feel like there is something going on. She may need a referral to be seen by cardiology ASAP. She does see her primary care provider early next week she tells me 07/11/2020 upon evaluation today patient's wounds again appear to be doing about the same she mainly has weeping of the bilateral lower extremities both are very swollen today she tells me she has been using her pumps but last time I saw her she told me she did not even have her pumps out that she had "packed them away because they had too much stuff in their house. Apparently her husband has been bringing stuff in from storage units. She then subsequently told me that she had so much stuff in her house that she has been staying up all night and did not go to bed till 7:00 this morning because she was cleaning up stuff. Nonetheless I am still unsure as to whether or not she is using the pumps based on what I am seeing I would think probably not. Nonetheless she  has been keeping the compression wraps in place that is at least something 07/25/2020 upon evaluation today patient appears to be doing well currently in regard to her legs all things considered. I do not think she is doing any worse significantly although honestly I do not think she is doing a lot better either. There does not appear to be any signs of infection which is good news although she does have a lot of weeping. She is using her pumps she tells me sporadically though she says that her husband is not a big fan of helping her to get the [place]. She also tells me is difficult because in their house. It would be easiest for her to use these as where her son is actually staying with her and living out at this point. Obviously I understand that she is having a lot of issues here and to be honest I do not really know what to say in that regard and the fact that I really cannot change the situations but I do believe she really needs to be using the lymphedema pumps to try to keep things under control here. Outside of that I think she is going to continue to have significant issues. I think the pumps coupled with good compression and elevation are the only things that are to help her at this point. 08/08/2020 upon evaluation today patient appears to be doing a little worse in regard to her legs in general. I think this may be due to some infection currently. With that being said I am going to go ahead and likely see about putting her on an antibiotic. Were also can obtain a culture today where she had some purulent drainage. 08/29/2020 upon evaluation today patient appears to  be doing about the same in regard to her bilateral lower extremities. Unfortunately she is continuing to have significant issues here with edema and she has significant lymphedema. With that being said she is really not doing anything that she is supposed to be doing as far as elevation, compression, using lymphedema pumps or  anything really for that matter. She does not use her pumps, is taken the wraps off shortly after having them put on as far as home health is concerned at least by the next day she tells me, and overall is not really elevating her legs she is telling me she has a lot to do as far as cleaning up in her house. Nonetheless this triad of noncompliance is leading to worsening not improving in regard to her legs. I had a very strong conversation with her about this today again. 09/12/2020 on evaluation today patient appears to be doing poorly with regard to her lower extremities bilaterally. Fortunately there does not appear to be any signs of active infection at this point. That is systemically. Locally there is some signs of pus coming from an area on her left leg. She has been on antibiotics recently though she is done with those currently. Nonetheless she tells me she has been having increased pain with the right leg this is extremely swollen as well. Fortunately there does not appear to be significant infection of the right leg though I think her pain is really as result of the increased swelling she has been declining the wrap on the right leg and the wounds here are getting significantly worse week by week. This obviously is not will be want to see. I discussed with the patient however if she does not use the wraps, use her pumps, and elevate her legs that she is not to get better. She has been sleeping in her motorized Hoveround which does not even allow her to elevate her legs at all. She tells me that she is also constantly "cleaning up as her husband told her that he wants to move back to [place]." Unfortunately this has been the narrative for the past several months in fact that she tells me that she has been having to clean up mass that was moved in from storage units into their home and that she does not really sleep and she never really elevates her legs. I explained to the patient today  that if she is not can I do any of this that there is really nothing that I can do to help her. 09/26/20 upon evaluation today patient appears to be doing about the same in regard to her wounds. Fortunately there is no signs of active infection at this time. No fever chills noted. She has been tolerating the dressing changes without complication which is good news. With that being said I do think that she unfortunately is still having a lot of pain but I think this is due to the swelling that really is not controlled and as I discussed with her last time she needs to be using her lymphedema pumps there is still in the box she has not even attempted to use those whatsoever. I am also not even certain she is taking her fluid pills as she states she may need to have "get a refill" she was seen with her husband at the appointment today. 10/24/2020 upon evaluation today patient appears to be doing decently well in regard to her legs all things considered. She has been tolerating  the dressing changes without complication. Fortunately there is no signs of active infection at this time. No fevers, chills, nausea, vomiting, or diarrhea. With that being said the patient does appear to have some excessive thickened skin buildup currently that has been require some sharp debridement to clear this away. I am hopeful if we do this we will be able to get some of this area to dry out this otherwise trapping fluid underneath. Her son is present during the office visit today. She tells me she still been doing a lot as far as cleaning up and going through boxes she tells me she has been up for a long time already this morning. 5/25; patient presents for 2-week follow-up. She had 3 layer compression wrap placed with calcium alginate underneath. She reports tolerating the wraps well. She has home health that changes the wrap 2-3 times a week. She denies signs of infection. She has almost finished her course of antibiotics. She  uses her lymphedema pumps once weekly. 11/21/2020 upon evaluation today patient appears to be doing well with regard to her legs all things considered. Fortunately there is no signs of active infection at this time which is great news I do not see any evidence of infection and overall I think that she is definitely improved compared to where things did previous. Nonetheless I do think that she may benefit from removing some of the thicker skin on the legs. Again we have attempted this in the past to some degree but I think that we might be able to more effectively do this of a clear some way and then potentially use a urea cream with salicylic acid to try to help clear some of this away. The patient is in agreement with the plan. 12/05/2020 upon evaluation today patient appears to be doing well with regard to her wounds in general. I feel like that things are somewhat improved the skin looking a little better. With that being said she still has significant lymphedema. She is not been using her lymphedema pumps and not really been elevating her legs. She also did have some maggots noted on the left leg she tells me that the "gnats are terrible and get all over her foot." With that being said I am concerned about the fact that the patient still is quite swollen even with the compression wraps I really feel like she needs to be using her pumps but I cannot talk her into doing this. 12/19/2020 upon evaluation today patient appears to be doing about the same in regard to her legs. Fortunately I do not see any signs of infection unfortunately she really seems to be very nonchalant about the fact that she has not been using her lymphedema pumps. She tells me that "I just forget". With that being said this needs to be something that is a priority for her I cannot count the number of times that have had this discussion with her and yet nothing seems to change. With that being said I think that is very unlikely she  is ever really getting healed with regard to her wounds to be honest she does not seem to be motivated to do anything to try to help at this point despite everything that I have tried to do and recommend for her. 01/02/2021 upon evaluation today patient actually appears to be doing excellent in regard to her wounds. She has been tolerating the dressing changes without complication. Fortunately there does not appear to be any signs of  active infection at this time which is great news. No fevers, chills, nausea, vomiting, or diarrhea. Electronic Signature(s) Signed: 01/02/2021 12:53:01 PM By: [name] PA-C Entered By: [name] on 01/02/2021 12:53:01 -------------------------------------------------------------------------------- Physical Exam Details Patient Name: Date of Service: [name], [name] 01/02/2021 10:45 A M Medical Record Number: [id number] Patient Account Number: [id number] Date of Birth/Sex: Treating RN: 12-18-1950 (70 y.o. [name] Primary Care Provider: [name] Other Clinician: Referring Provider: Treating Provider/Extender: Darlen Round in Treatment: 56 Constitutional Well-nourished and well-hydrated in no acute distress. Respiratory normal breathing without difficulty. Psychiatric this patient is able to make decisions and demonstrates good insight into disease process. Alert and Oriented x 3. pleasant and cooperative. Notes Patient's wound bed showed signs of good granulation epithelization at this point. There does not appear to be any evidence of worsening infection he is doing significantly better I am actually very pleased with where everything stands at this point. No fevers, chills, nausea, vomiting, or diarrhea. Electronic Signature(s) Signed: 01/02/2021 12:53:34 PM By: [name] PA-C Entered By: [name] on 01/02/2021  12:53:34 -------------------------------------------------------------------------------- Physician Orders Details Patient Name: Date of Service: [name] 01/02/2021 10:45 A M Medical Record Number: [id number] Patient Account Number: [id number] Date of Birth/Sex: Treating RN: 17-Feb-1951 (70 y.o. [name] Primary Care Provider: [name] Other Clinician: Referring Provider: Treating Provider/Extender: Darlen Round in Treatment: [phone] Verbal / Phone Orders: No Diagnosis Coding ICD-10 Coding Code Description E11.622 Type 2 diabetes mellitus with other skin ulcer I89.0 Lymphedema, not elsewhere classified I87.331 Chronic venous hypertension (idiopathic) with ulcer and inflammation of right lower extremity I87.332 Chronic venous hypertension (idiopathic) with ulcer and inflammation of left lower extremity L97.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 leg with fat layer exposed L97.822 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 Bathing/ Shower/ Hygiene May shower and wash wound with soap and water. - with dressing changes, wash both legs with wash cloth and soap and water with dressing changes Other Bathing/Shower/Hygiene Orders/Instructions: - Home health to wash legs with warn soapy water and a clean wash cloth with dressing changes Edema Control - Lymphedema / SCD / Other Bilateral Lower Extremities Lymphedema Pumps. Use Lymphedema pumps on leg(s) 2-3 times a day for 45-60 minutes. If wearing any wraps or hose, do not remove them. Continue exercising as instructed. Elevate legs to the level of the heart  or above for 30 minutes daily and/or when sitting, a frequency of: - especially at night Avoid standing for long periods of time. Exercise regularly Home Health No change in wound care orders this week; continue Home Health for wound care. May utilize formulary equivalent dressing for wound treatment orders unless otherwise specified. - add urea cream to legs with dressing changes when available Dressing changes to be completed by [place] on Monday / Wednesday / Friday except when patient has scheduled visit at [facility]. Other Home Health Orders/Instructions: - [facility] Wound #61 - Lower Leg Wound Laterality: Left, Circumferential Peri-Wound Care: Triamcinolone 15 (g) 3 x Per Week/30 Days Discharge Instructions: Use triamcinolone 15 (g) thin layer on thick dry skin Peri-Wound Care: Sween Lotion (Moisturizing  lotion) (Home Health) 3 x Per Week/30 Days Discharge Instructions: Apply moisturizing lotion to dry skin on legs Prim Dressing: Maxorb Extra Calcium Alginate 2x2 in (Home Health) 3 x Per Week/30 Days ary Discharge Instructions: Apply calcium alginate to wound bed as instructed Secondary Dressing: ABD Pad, 8x10 (Home Health) 3 x Per Week/30 Days Discharge Instructions: Apply over primary dressing as directed. Secondary Dressing: Zetuvit Plus 4x4 in (Home Health) 3 x Per Week/30 Days Discharge Instructions: or equivalent extra absorbent pad.Apply over primary dressing as needed. Compression Wrap: ThreePress (3 layer compression wrap) (Home Health) 3 x Per Week/30 Days Discharge Instructions: Apply three layer compression as directed. Pad bend of ankle with foam or ABD pad. Wound #64 - Foot Wound Laterality: Dorsal, Left Peri-Wound Care: Triamcinolone 15 (g) 3 x Per Week/30 Days Discharge Instructions: Use triamcinolone 15 (g) thin layer on thick dry skin Peri-Wound Care: Sween Lotion (Moisturizing lotion) (Home Health) 3 x Per Week/30 Days Discharge  Instructions: Apply moisturizing lotion to dry skin on legs Prim Dressing: Maxorb Extra Calcium Alginate 2x2 in (Home Health) 3 x Per Week/30 Days ary Discharge Instructions: Apply calcium alginate to wound bed as instructed Secondary Dressing: ABD Pad, 8x10 (Home Health) 3 x Per Week/30 Days Discharge Instructions: Apply over primary dressing as directed. Secondary Dressing: Zetuvit Plus 4x4 in (Home Health) 3 x Per Week/30 Days Discharge Instructions: or equivalent extra absorbent pad.Apply over primary dressing as needed. Compression Wrap: ThreePress (3 layer compression wrap) (Home Health) 3 x Per Week/30 Days Discharge Instructions: Apply three layer compression as directed. Pad bend of ankle with foam or ABD pad. Wound #67 - Lower Leg Wound Laterality: Right, Medial Peri-Wound Care: Triamcinolone 15 (g) 3 x Per Week/30 Days Discharge Instructions: Use triamcinolone 15 (g) thin layer on thick dry skin Peri-Wound Care: Sween Lotion (Moisturizing lotion) (Home Health) 3 x Per Week/30 Days Discharge Instructions: Apply moisturizing lotion to dry skin on legs Prim Dressing: Maxorb Extra Calcium Alginate 2x2 in (Home Health) 3 x Per Week/30 Days ary Discharge Instructions: Apply calcium alginate to wound bed as instructed Secondary Dressing: ABD Pad, 8x10 (Home Health) 3 x Per Week/30 Days Discharge Instructions: Apply over primary dressing as directed. Secondary Dressing: Zetuvit Plus 4x4 in (Home Health) 3 x Per Week/30 Days Discharge Instructions: or equivalent extra absorbent pad.Apply over primary dressing as needed. Compression Wrap: ThreePress (3 layer compression wrap) (Home Health) 3 x Per Week/30 Days Discharge Instructions: Apply three layer compression as directed. Pad bend of ankle with foam or ABD pad. Electronic Signature(s) Signed: 01/02/2021 5:20:22 PM By: [name] PA-C Signed: 01/02/2021 6:23:27 PM By: [name] RN, BSN Entered By: [name] on 01/02/2021  11:39:53 -------------------------------------------------------------------------------- Problem List Details Patient Name: Date of Service: [name]. 01/02/2021 10:45 A M Medical Record Number: [id number] Patient Account Number: [id number] Date of Birth/Sex: Treating RN: 1950-12-21 (70 y.o. [name] Primary Care Provider: [name] Other Clinician: Referring Provider: Treating Provider/Extender: Darlen Round in Treatment: [phone] Active Problems ICD-10 Encounter Code Description Active Date MDM Diagnosis E11.622 Type 2 diabetes mellitus with other skin ulcer 08/05/2017 No Yes I89.0 Lymphedema, not elsewhere classified 08/05/2017 No Yes I87.331 Chronic venous hypertension (idiopathic) with ulcer and inflammation of right 08/05/2017 No Yes lower extremity I87.332 Chronic venous hypertension (idiopathic) with ulcer and inflammation of left 08/05/2017 No Yes lower extremity L97.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 leg with fat layer 08/05/2017 No Yes exposed L97.822 Non-pressure chronic ulcer of  other part of left lower leg with fat layer exposed2/20/2019 No Yes L97.522 Non-pressure chronic ulcer of other part of left foot with fat layer exposed 06/01/2019 No Yes I10 Essential (primary) hypertension 08/05/2017 No Yes E66.01 Morbid (severe) obesity due to excess calories 08/05/2017 No Yes F41.8 Other specified anxiety disorders 08/05/2017 No Yes R53.1 Weakness 08/05/2017 No Yes Inactive Problems Resolved Problems Electronic Signature(s) Signed: 01/02/2021 11:01:40 AM By: [name] PA-C Entered By: [name] on 01/02/2021 11:01:40 -------------------------------------------------------------------------------- Progress Note Details Patient Name: Date of Service: [name]. 01/02/2021 10:45 A M Medical Record Number: [id number] Patient Account Number: [id number] Date of Birth/Sex: Treating RN: Apr 12, 1951 (70  y.o. [name] Primary Care Provider: [name] Other Clinician: Referring Provider: Treating Provider/Extender: Darlen Round in Treatment: [phone] Subjective Chief Complaint Information obtained from Patient Bilateral reoccurring LE ulcers History of Present Illness (HPI) this patient has been seen a couple of times before and returns with recurrent problems to her right and left lower extremity with swelling and weeping ulcerations due to not wearing her compression stockings which she had been advised to do during her last discharge, at the end of June 2018. During her last visit the patient had had normal arterial blood flow and her venous reflux study did not necessitate any surgical intervention. She was recommended compression and elevation and wound care. After prolonged treatment the patient was completely healed but she has been noncompliant with wearing or compressions.. She was here last week with an outpatient return visit planned but the patient came in a very poor general condition with altered mental status and was rushed to the ER on my request. With a history of hypertension, diabetes, TIA and right-sided weakness she was set up for an MRI on her brain and cervical spine and was sent to [facility]. Getting an MRI done was very difficult but once the workup was done she was found not to have any spinal stenosis, epidural abscess or hematoma or discitis. This was radiculopathy to be treated as an outpatient and she was given a follow-up appointment. Today she is feeling much better alert and oriented and has come to reevaluate her bilateral lower extremity lymphedema and ulceration 03/25/2017 -- she was admitted to the hospital on 03/16/2017 and discharged on 03/18/2017 with left leg cellulitis and ulceration. She was started on vancomycin and Zosyn and x-ray showed no bony involvement. She was treated for a cellulitis with IV antibiotics changed to  Rocephin and Flagyl and was discharged on oral Keflex and doxycycline to complete a 7 day course. Last hemoglobin A1c was 7.1 and her other ailments including hypertension got asthma were appropriately treated. 05/06/2017 -- she is awaiting the right size of compression stockings from [facility] but other than that has been doing well. ====== Old notes 70 year old patient was seen one time last October and was lost to follow-up. She has recurrent problems with weeping and ulceration of her left lower extremity and has swelling of this for several years. It has been worse for the last 2 months. Past medical history is significant for diabetes mellitus type 2, hypertension, gout, morbid obesity, depressive disorders, hiatal hernia, migraines, status post knee surgery, risk of a cholecystectomy, vaginal hysterectomy and breast biopsy. She is not a smoker. As noted before she has never had a venous duplex study and an arterial ABI study was attempted but the left lower extremity was noncompressible 10/01/2016 -- had a lower extremity venous duplex reflux evaluation  which showed no evidence of deep vein reflux in the right or left lower extremity, and no evidence of great saphenous vein reflux more than 500 ms in the right or left lower extremity, and the left small saphenous vein is incompetent but no vascular consult was recommended. review of her electronic medical records noted that the ABI was checked in July 2017 where the right ABI was normal limits and the left ABI could not be ascertained due to pain with cuff pressure but the waveforms are within normal limits. her arterial duplex study scheduled for April 27. 10/08/2016 -- the patient has various reasons for not having a compression on and for the last 3 days she has had no compression on her left lower extremity either due to pain or the lack of nursing help. She does not use her juxta lites either. 10/15/2016 -- the patient did not keep her  appointment for arterial duplex study on April 27 and I have asked her to reschedule this. Her pain is out of proportion with the physical findings and she continuously fails to wear a compression wraps and cuts them off because she says she cannot tolerate the pain. She does not use her juxta lites either. 10/22/2016 -- he has rescheduled her arterial duplex study to May 21 and her pain today is a bit better. She has not been wearing her juxta lites on her right lower extremity but now understands that she needs to do this. She did tolerate the to press compression wrap on her left lower extremity 10/29/2016 --arterial duplex study is scheduled for next week and overall she has been tolerating her compression wraps and also using her juxta lites on her right lower extremity 11/05/2016 -- the right ABI was 0.95 the left was 1.03. The digit TBI is on the right was 0.83 on the left was 0.92 and she had biphasic flow through these vessels. The impression was that of normal lower extremity arterial study. 11/12/2016 -- her pain is minimal and she is doing very well overall. 11/26/2016 -- she has got juxta lites and her insurance will not pay for additional dual layer compression stockings. She is going to order some from [facility]. 05/12/2017 -- her juxta lites are very old and too big for her and these have not been helping with compression. She did get 20-30 mm compression stockings from [facility] but she and her husband are unable to put these on. I believe she will benefit from bilateral Extremit-ease, compression stockings and we will measure her for these today. 05/20/2017 -- lymphedema on the left lower extremity has increased a lot and she has a open ulceration as a result of this. The right lower extremity is looking pretty good. She has decided to by the compression stockings herself and will get reimbursed by the home health, at a later date. 05/27/2017 -- her sciatica is bothering her a lot and  she thought her left leg pain was caused due to the compression wrap and hence removed it and has significant lymphedema. There is no inflammation on this left lower extremity. 06/17/17 on evaluation today patient appears to be doing very well and in fact is completely healed in regard to her ulcerations. Unfortunately however she does have continued issues with lymphedema nonetheless. We did order compression garments for her unfortunately she states that the size that she received were large although we ordered medium. Obviously this means she is not getting the optimal compression. She does not have those with her  today and therefore we could not confirm and contact the company on her behalf. Nonetheless she does state that she is going to have her husband bring them by tomorrow so that we can verify and then get in touch with the company. No fevers, chills, nausea, or vomiting noted at this time. Overall patient is doing better otherwise and I'm pleased with the progress she has made. 07/01/17 on evaluation today patient appears to be doing very well in regard to her bilateral lower extremity she does not have any openings at this point which is excellent news. Overall I'm pleased with how things have progressed up to this time. Since she is doing so well we did order her compression which we are seeing her today to ensure that it fits her properly and everything is doing well in that regard and then subsequently she will be discharged. ============ Old Notes: 03/31/16 patient presents today for evaluation concerning open wounds that she has over the left medial ankle region as well as the left dorsal foot. She has previously had this occur although it has been healed for a number of months after having this for about a year prior until her hospitalization on 01/05/16. At that point in time it appears that she was admitted to the hospital for left lower extremity cellulitis and was placed on  vancomycin and Zosyn at that point. Eventually upon discharge on January 15, 2016 she was placed on doxycycline at that point in time. Later on 03/27/16 positive wound culture growing Escherichia coli this was switched to amoxicillin. Currently she tells me that she is having pain radiated to be a 7 out of 10 which can be as high as 10 out of 10 with palpation and manipulation of the wound. This wound appears to be mainly venous in nature due to the bilateral lower extremity venous stasis/lymphedema. This is definitely much worse on her left than the right side. She does have type 1 diabetes mellitus, hypertension, morbid obesity, and is wheelchair dependent.during the course of the hospital stay a blood culture was also obtained and fortunately appeared negative. She also had an x-ray of the tibia/fibula on the left which showed no acute bone abnormality. Her white blood cell count which was performed last on 03/25/16 was 7.3, hemoglobin 12.8, protein 7.1, albumin 3.0. Her urine culture appeared to be negative for any specific organisms. Patient did have a left lower extremity venous duplex evaluation for DVT . This did not include venous reflux studies but fortunately was negative for DVT Patient also had arterial studies performed which revealed that she had a . normal ABI on the right though this was unable to be performed on the left secondary to pain that she was having around the ankle region due to the wound. However it was stated on report that she had biphasic pulses and apparently good blood flow. ========== 06/03/17 she is here in follow-up evaluation for right lower extremity ulcer. The right lower sure he has healed but she has reopened to the left medial malleolus and dorsal foot with weeping. She is waiting for new compression garments to arrive from home health, the previous compression garments were ill fitting. We will continue with compression bilaterally and follow-up in 2  weeks Readmission: 08/05/17 on evaluation today patient appears to be doing somewhat poorly in regard to her left lower extremity especially although the right lower extremity has a small area which may no longer be open. She has been having a lot of  drainage from the left lower extremity however he tells me that she has not been able to use the EXTREMIT-EASE Compression at this point. She states that she did better and was able to actually apply the Juxta-Lite compression although the wound that she has is too large and therefore really does not compress which is why she cannot wear it at this point. She has no one who can help her put it on regular basis her son can sometimes but he's not able to do it most of the time. I do believe that's why she has begun to weave and have issues as she is currently yet again. No fevers, chills, nausea, or vomiting noted at this time. Patient is no evidence of dementia. 08/12/17 on evaluation today patient appears to still be doing fairly well in regard to the draining areas/weeping areas at this point. With that being said she unfortunately did go to the ER yesterday due to what was felt to be possibly a cellulitis. They place her on doxycycline by mouth and discharge her home. She definitely was not admitted. With that being said she states she has had more discomfort which has been unusual for her even compared to prior times and she's had infections.08/12/17 on evaluation today patient appears to still be doing fairly well in regard to the draining areas/weeping areas at this point. With that being said she unfortunately did go to the ER yesterday due to what was felt to be possibly a cellulitis. They place her on doxycycline by mouth and discharge her home. She definitely was not admitted. With that being said she states she has had more discomfort which has been unusual for her even compared to prior times and she's had infections. 08/19/17 put evaluation today  patient tells me that she's been having a lot of what sounds to be neuropathic type pain in regard to her left lower extremity. She has been using over-the-counter topical bins again which some believe. That in order to apply the she actually remove the wrap we put on her last Wednesday on Thursday. Subsequently she has not had anything on compression wise since that time. The good news is a lot of the weeping areas appear to have closed at this point again I believe she would do better with compression but we are struggling to get her to actually use what she needs to at this point. No fevers, chills, nausea, or vomiting noted at this time. 09/03/17 on evaluation today patient appears to be doing okay in regard to her lower extremities in regard to the lymphedema and weeping. Fortunately she does not seem to show any signs of infection at this point she does have a little bit of weeping occurring in the right medial malleolus area. With that being said this does not appear to be too significant which is good news. 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 She has severe skin damage secondary to both of these features involving the dorsal left foot and medial left ankle and lower leg. Still has open areas in the left anterior foot. The area on the right closed over. She uses her own juxta light stockings. She does not have an arterial issue 09/16/17 on evaluation today patient actually appears to be doing excellent in regard to her bilateral lower extremity swelling. The Juxta-Lite compression wrap seem to be doing very well for her. She has not however been using the portion that goes over  her foot. Her left foot still is draining a little bit not nearly as significant as it has been in the past but still I do believe that she likely needs to utilize the full wrap including the foot portion of this will improve as well. She also has  been apparently putting on a significant amount of Vaseline which also think is not helpful for her. I recommended that if she feels she needs something for moisturizer Eucerin will probably be better. 09/30/17 on evaluation today patient presents with several new open areas in regard to her left lower extremity although these appear to be minimal and mainly seem to be more moisture breakdown than anything. Fortunately she does not seem to have any evidence of infection which is great news. She has been tolerating the dressing changes without complication we are using silver alginate on the foot she has been using AB pads to have the legs and using her Juxta- Lite compression which seems to be controlling her swelling very well. Overall I'm pleased with the poor way she has progressed. 10/14/17 on evaluation today patient appears to be doing better in regard to her left lower extremity areas of weeping. She does still have some discomfort although in general this does not appear to be as macerated and I think it is progressing nicely. I do think she still needs to wear the foot portion of her Juxta- Lite in order to get the most benefit from the wrap obviously. She states she understands. Fortunately there does not appear to be evidence of infection at this time which is great news. 10/28/17 on evaluation today patient appears to be doing excellent in regard to her left lower extremity. She has just a couple areas that are still open and seem to be causing any trouble whatsoever. For that reason I think that she is definitely headed in the right direction the spots are very tiny compared to what we have been dealing with in the past. 11/11/17 on evaluation today patient appears to have a right lateral lower extremity ulcer that has opened since I last saw her. She states this is where the home health nurse that was coming out remove the dressing without wetting the alginate first. Nonetheless I do not know  if this is indeed the case or not but more importantly we have not ordered home help to be coming out for her wounds at all. I'm unsure as to why they are coming out and we're gonna have to check on this and get things situated in that regard. With that being said we currently really do not need them to be coming out as the patient has been taking care of her leg herself without complication and no issues. In fact she was doing much better prior to nursing coming out. 11/25/17 on evaluation today patient actually appears to be doing fairly well in regard to her left lower extremity swelling. In fact she has very little area of weeping at this point there's just a small spot on the lateral portion of her right leg that still has me just a little bit more concerned as far as wanting to see this clear up before I discharge her to caring for this at home. Nonetheless overall she has made excellent progress. 12/09/17 on evaluation today patient appears to be doing rather well in regard to her lower extremity edema. She does have some weeping still in the left lower extremity although the big area we were taking care  of two weeks ago actually has closed and she has another area of weeping on the left lower extremity immediately as well is the top of her foot. She does not currently have lymphedema pumps she has been wearing her compression daily on a regular basis as directed. With that being said I think she may benefit from lymphedema pumps. 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 We had a very difficult time getting and keeping this under control. 12/23/17 on evaluation today patient actually appears to be doing a little bit more poorly in regard to her bilateral lower extremities. She has been tolerating the Juxta-Lite compression wraps. Unfortunately  she has two new ulcers on the right lower extremity and left lower [place] ulceration seems to be larger. Obviously this is not good news. She has been tolerating the dressings without complication. 12/30/17 on evaluation today patient actually appears to be doing much better in regard to her bilateral lower extremity edema. She continues to have some issues with ulcerations and in fact there appears to be one spot on each leg where the wrap may have caused a little bit of a blister which is subsequently opened up at this point is given her pain. Fortunately it does not appear to be any evidence of infection which is good news. No fevers chills noted. 01/13/18 on evaluation today patient appears to be doing rather well in regard to her bilateral lower extremities. The dressings did get kind of stuck as far as the wound beds are concerned but again I think this is mainly due to the fact that she actually seems to be showing signs of healing which is good news. She's not having as much drainage therefore she was having more of the dressing sticking. Nonetheless overall I feel like her swelling is dramatically down compared to previous. 01/20/18 on evaluation today patient unfortunately though she's doing better in most regards has a large blister on the left anterior lower extremity where she is draining quite significantly. Subsequently this is going to need debridement today in order to see what's underneath and ensure she does not continue to trapping fluid at this location. Nonetheless No fevers, chills, nausea, or vomiting noted at this time. 01/27/18 on evaluation today patient appears to be doing rather well at this point in regard to her right lower extremity there's just a very small area that she still has open at this point. With that being said I do believe that she is tolerating the compression wraps very well in making good progress. Home health is coming out at this point to see her. Her left  lower extremity on the lateral portion is actually what still mainly open and causing her some discomfort for the most part 02/10/18 on evaluation today patient actually appears to be doing very well in regard to her right lower extremity were all the ulcers appear to be completely close. In regard to the left lower extremity she does have two areas still open and some leaking from the dorsal surface of her foot but this still seems to be doing much better to me in general. 02/24/18 on evaluation today patient actually appears to be doing much better in regard to her right lower extremity this is still completely healed. Her left lower extremity is also doing much better fortunately she has no evidence of  infection. The one area that is gonna require some debridement is still on the left anterior shin. Fortunately this is not hurting her as badly today. 03/10/18 on evaluation today patient appears to be doing better in some regards although she has a little bit more open area on the dorsal foot and she also has some issues on the medial portion of the left lower extremity which is actually new and somewhat deep. With that being said there fortunately does not appear to be any significant signs of infection which is good news. No fevers, chills, nausea, or vomiting noted at this time. In general her swelling seems to be doing fairly well which is good news. 03/31/18 on evaluation today patient presents for follow-up concerning her left lower extremity lymphedema. Unfortunately she has been doing a little bit more poorly since I last saw her in regard to the amount of weeping that she is experiencing. She's also having some increased pain in the anterior shin location. Unfortunately I do not feel like the patient is making such good progress at this point a few weeks back she was definitely doing much better. 04/07/18 on evaluation today patient actually appears to be showing some signs of improvement as  far as the left lower extremity is concerned. She has been tolerating the dressing changes and it does appear that the Drawtex did better for her. With that being said unfortunately home health is stating that they cannot obtain the Drawtex going forward. Nonetheless we're gonna have to check and see what they may be able to get the alginate they were using was getting stuck in causing new areas of skin being pulled all that with and subsequently weep and calls her to worsen overall this is the first time we've seen improvement at this time. 04/14/18 on evaluation today patient actually appears to be doing rather well at this point there does not appear to be any evidence of infection at this time and she is actually doing excellent in regard to the weeping in fact she almost has no openings remaining even compared to just last week this is a dramatic improvement. No fevers chills noted 04/21/18 evaluation today patient actually appears to be doing very well. She in fact is has a small area on the posterior lower extremity location and she has a small area on the dorsal surface of her foot that are still open both of which are very close to closing. We're hoping this will be close shortly. She brought her Juxta-Lite wrap with her today hoping that would be able to put her in it unfortunately I don't think were quite at that point yet but we're getting closer. 04/28/18 upon evaluation today patient actually appears to be doing excellent in regard to her left lower extremity ulcer. In fact the region on the posterior lower extremity actually is much smaller than previously noted. Overall I'm very happy with the progress she has made. She again did bring her Juxta-Lite although we're not quite ready for that yet. 05/11/18 upon evaluation today patient actually appears to be doing in general fairly well in regard to her left lower Trinity. The swelling is very well controlled. With that being said she has  a new area on the left anterior lower extremity as well as between the first and second toes of her left foot that was not present during the last evaluation. The region of her posterior left lower extremity actually appears to be almost completely healed. T be honest  I'm very pleased with o the way that stands. Nonetheless I do believe that the lotion may be keeping the area to moist as far as her legs are concerned subsequently I'm gonna consider discontinuing that today. 05/26/18 on evaluation today patient appears to be doing rather well in regard to her left lower should be ulcers. In fact everything appears to be close except for a very small area on the left posterior lower extremity. Fortunately there does not appear to be any evidence of infection at this time. Overall very pleased with her progress. 06/02/18 and evaluation today patient actually appears to be doing very well in regard to her lower extremity ulcers. She has one small area that still continues to weep that I think may benefit her being able to justify lotion and user Juxta-Lite wraps versus continued to wrap her. Nonetheless I think this is something we can definitely look into at this point. 06/23/18 on evaluation today patient unfortunately has openings of her bilateral lower extremities. In general she seems to be doing much worse than when I last saw her just as far as her overall health standpoint is concerned. She states that her discomfort is mainly due to neuropathy she's not having any other issues otherwise. No fevers, chills, nausea, or vomiting noted at this time. 06/30/18 on evaluation today patient actually appears to be doing a little worse in regard to her right lower extremity her left lower extremity of doing fairly well. Fortunately there is no sign of infection at this time. She has been tolerating the dressing changes without complication. Home health did not come out like they were supposed to for the  appropriate wrap changes. They stated that they never received the orders from [place] which were fax. Nonetheless we will send a copy of the orders with the patient today as well. 07/07/18 on evaluation today patient appears to be doing much better in regard to lower extremities. She still has several openings bilaterally although since I last saw her her legs did show obvious signs of infection when she later saw her nurse. Subsequently a culture was obtained and she is been placed on Bactrim and Keflex. Fortunately things seem to be looking much better it does appear she likely had an infection. Again last week we'd even discussed it but again there really was not any obvious sign that she had infection therefore we held off on the antibiotics. Nonetheless I'm glad she's doing better today. 07/14/18 on evaluation today patient appears to be doing much better regarding her bilateral lower Trinity's. In fact on the right lower for me there's nothing open at this point there are some dry skin areas at the sites where she had infection. Fortunately there is no evidence of systemic infection which is excellent news. No fevers chills noted 07/21/18 on evaluation today patient actually appears to be doing much better in regard to her left lower extremity ulcers. She is making good progress and overall I feel like she's improving each time I see her. She's having no pain I do feel like the infection is completely resolved which is excellent news. No fevers, chills, nausea, or vomiting noted at this time. 07/28/18 on evaluation today patient appears to be doing very well in regard to her left lower [employer]. Everything seems to be showing signs of improvement which is excellent news. Overall very pleased with the progress that has been made. Fortunately there's no evidence of active infection at this time also excellent news. 08/04/18  on evaluation today patient appears to be doing more poorly in regard to her  bilateral lower extremities. She has two new areas open up on the right and these were completely closed as of last week. She still has the two spots on the left which in my pinion seem to be doing better. Fortunately there's no evidence of infection again at this point. 08/11/18 on evaluation today patient actually appears to be doing very well in regard to her bilateral lower Trinity wounds that all seem to be doing better and are measures smaller today. Fortunately there's no signs of infection. No fevers, chills, nausea, or vomiting noted at this time. 08/18/18 on evaluation today patient actually appears to be doing about the same inverter bilateral lower extremities. She continues to have areas that blistering open as was drain that fortunately nothing too significant. Overall I feel like Drawtex may have done better for her however compared to the collagen. 08/25/18 on evaluation today patient appears to be doing a little bit more poorly today even compared to last time I saw her. Again I'm not exactly sure why she's making worse progress over the past several weeks. I'm beginning to wonder if there is some kind of underlying low level infection causing this issue. I did actually take a culture from the left anterior lower extremity but it was a new wound draining quite a bit at this point. Unfortunately she also seems to be having more pain which is what also makes me worried about the possibility of infection. This is despite never erythema noted at this point. 09/01/18 on evaluation today patient actually appears to be doing a little worse even compared to last week in regard to bilateral lower extremities. She did go to the hospital on the 16th was given a dose of IV Zosyn and then discharged with a recommendation to continue with the Bactrim that I previously prescribed for her. Nonetheless she is still having a lot of discomfort she tells me as well at this time. This is definitely unfortunate.  No fevers, chills, nausea, or vomiting noted at this time. 09/08/18 on evaluation today patient's bilateral lower extremities actually appear to be shown signs of improvement which is good news. Fortunately there does not appear to be any signs of active infection I think the anabiotic is helping in this regard. Overall I'm very pleased with how she is progressing. 09/15/18 patient was actually seen in ER yesterday due to her legs as well unfortunately. She states she's been having a lot of pain and discomfort as well as a lot of drainage. Upon inspection today the patient does have a lot of swelling and drainage I feel like this is more related to lymphedema and poor fluid control than it is to infection based on what I'm seeing. The physician in the emergency department also doubted that the patient was having a significant infection nonetheless I see no evidence of infection obvious at this point although I do see evidence of poor fluid control. She still not using a compression pumps, she is not elevating due to her lift chair as well as her hospital bed being broken, and she really is not keeping her legs up as much as they should be and also has been taking off her wraps. All this combined I think has led to poor fluid control and to be honest she may be somewhat volume overloaded in general as well. I recommend that she may need to contact your physician to see  if a prescription for a diuretic would be beneficial in their opinion. As long as this is safe I think it would likely help her. 09/29/18 on evaluation today patient's left lower extremity actually appears to be doing quite a bit better. At least compared to last time that I saw her. She still has a large area where she is draining from but there's a lot of new skin speckled trout and in fact there's more new skin that there are open areas of weeping and drainage at this point. This is good news. With regard to the right lower extremity this  is doing much better with the only open area that I really see being a dry spot on the right lateral ankle currently. Fortunately there's no signs of active infection at this time which is good news. No fevers, chills, nausea, or vomiting noted at this time. The patient seems somewhat stressed and overwhelmed during the visit today she was very lethargic as such. She does and she is not taking any pain medications at this point. Apparently according to her husband are also in the process of moving which is probably taking its toll on her as well. 10/06/18 on evaluation today patient appears to be doing rather well in regard to her lower extremities compared to last evaluation. Fortunately there's no signs of active infection. She tells me she did have an appointment with her primary. Nonetheless he was concerned that the wounds were somewhat deep based on pictures but we never actually saw her legs. She states that he had her somewhat worried due to the fact that she was fearing now that she was [place] have to have an amputation. With that being said based on what I'm seeing check she looks better this week that she has the last two times I've seen her with much less drainage I'm actually pleased in this regard. That doesn't mean that she's out of the water but again I do not think what the point of talking about education at all in regard to her leg. She is very happy to hear this. She is also not having as much pain as she was having last week. 10/13/18 unfortunately on evaluation today patient still continues to have a significant amount of drainage she's not letting home health actually apply the compression dressings at this point. She's trying to use of Juxta-Lite of the top of Kerlex and the second layer of the three layer compression wrap. With that being said she just does not seem to be making as good a progress as I would expect if she was having the compression applied and in place on a  regular basis. No fevers, chills, nausea, or vomiting noted at this time. 10/20/18 on evaluation today patient appears to be doing a little better in regard to her bilateral lower extremity ulcers. In fact the right lower extremity seems to be healed she doesn't even have any openings at this point left lower extremity though still somewhat macerated seems to be showing signs of new skin growth at multiple locations throughout. Fortunately there's no evidence of active infection at this time. No fevers, chills, nausea, or vomiting noted at this time. 10/27/18 on evaluation today patient appears to be doing much better in regard to her left lower Trinity ulcer. She's been tolerating the laptop complication and has minimal drainage noted at this point. Fortunately there's no signs of active infection at this time. No fevers, chills, nausea, or vomiting noted at this time. 11/03/18  on evaluation today patient actually appears to be doing excellent in regard to her left lower extremity. She is having very little drainage at this point there does not appear to be any significant signs of infection overall very pleased with how things have gone. She is likewise extremely pleased still and seems to be making wonderful progress week to week. I do believe antibiotics were helpful for her. Her primary care provider did place on amateur clean since I last saw her. 11/17/18 on evaluation today patient appears to be doing worse in regard to her bilateral lower extremities at this point. She is been tolerating the dressing changes without complication. With that being said she typically takes the Coban off fairly quickly upon arriving home even after being seen here in the clinic and does not allow home health reapply command as part of the dressing at home. Therefore she said no compression essentially since I last saw her as best I can tell. With that being said I think it shows and how much swelling she has in the  open wounds that are noted at this point. Fortunately there's no signs of infection but unfortunately if she doesn't get this under control I think she will end up with infection and more significant issues. 11/24/18 on evaluation today patient actually appears to be doing somewhat better in regard to her bilateral lower extremities. 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 Nonetheless that is doing better currently so she should be able to attempt reinitiating the lymphedema pumps at this time. No fevers, chills, nausea, or vomiting noted at this time. 12/01/18 upon evaluation today patient's left lower extremity appears to be doing quite well unfortunately her right lower extremity is not doing nearly as well. She has been tolerating the dressing changes without complication unfortunately she did not keep a wrap on the right at this time. Nonetheless I believe this has led to increased swelling and weeping in the world is actually much larger than during the last evaluation with her. 12/08/18 on evaluation today patient appears to be doing about the same at this point in regard to her right lower extremity. There is some more palatable to touch I'm concerned about the possibility of there being some infection although I think the main issue is she's not keeping her compression wrap on which in turn is not allowing this area to heal appropriately. 12/22/18 on evaluation today patient appears to be doing better in regard to left lower extremity unfortunately significantly worse in regard to the right lower extremity. The areas of blistering and necrotic superficial tissue have spread and again this does not really appear to be signs of infection and all she just doesn't seem to be doing nearly as well is what she has been in the past. Overall I feel like the  Augmentin did absolutely nothing for her she doesn't seem to have any infection again I really didn't think so last time either is more of a potential preventative measure and hoping that this would make some difference but I think the main issue is she's not wearing her compression. She tells me she cannot wear the [place] been we put on she takes it off pretty much upon getting home. Subsequently she worshiped Juxta-Lite when I questioned her about how often she wears it  this is no more than three hours a day obviously that leaves 21 hours that she has no compression and this is obviously not doing well for her. Overall I'm concerned that if things continue to worsen she is at great risk of both infection as well as losing her leg. 01/05/19 on evaluation today patient appears to be doing well in regard to her left lower extremity which he is allowing [place] to wrap and not so well with regard to her right lower extremity which she is not allowing [place] to really wrap and keep the wrap on. She states that it hurts too badly whenever it's wrapped and she ends up having to take it off. She's been using the Juxta-Lite she tells me up to six hours a day although I question whether or not that's really been the case to be honest. Previously she told me three hours today nonetheless obviously the legs as long as the wrap is doing great when she is not is doing much more poorly. 01/12/2019 on evaluation today patient actually appears to be doing a little better in my opinion with regard to her right lower extremity ulcer. She has a small open area on the left lower extremity unfortunately but again this I think is part of the normal fluctuation of what she is going to have to expect with regard to her legs especially when she is not using her lymphedema pumps on a regular basis. Subsequently based on what I am seeing today I think that she does seem to be doing slightly better with regard to her right lower extremity  she did see her primary care provider on Monday they felt she had an infection and placed her on 2 antibiotics. Both Cipro and clindamycin. Subsequently again she seems possibly to be doing a little bit better in regards to the right lower extremity she also tells me however she has been wearing the compression wrap over the past week since I spoke with her as well that is a Kerlix and Coban wrap on the right. No fevers, chills, nausea, vomiting, or diarrhea. 01/19/2019 on evaluation today patient appears to be doing better with regard to her bilateral lower extremities especially the right. I feel like the compression has been beneficial for her which is great news. She did get a call from her primary care provider on her way here today telling her that she did have methicillin- resistant Staphylococcus aureus and he was calling in a couple new antibiotics for her including a ointment to be applied she tells me 3 times a day. With that being said this sounds like likely to be Bactroban which I think could be applied with each dressing/wrap change but I would not be able to accommodate her applying this 3 times a day. She is in agreement with the least doing this we will add that to her orders today. 01/26/2019 on evaluation today patient actually appears to be doing much better with regard to her right lower extremity. Her left lower extremity is also doing quite well all things considering. Fortunately there is no evidence of active infection at this time. No fevers, chills, nausea, vomiting, or diarrhea. 02/02/2019 on evaluation today patient appears to be doing much better compared to her last evaluation. Little by little off like her right leg is returning more towards normal. There does not appear to be any signs of active infection and overall she seems to be doing quite well which is great news. I am  very pleased in this regard. No fevers, chills, nausea, vomiting, or diarrhea. 02/09/2019 upon  evaluation today patient appears to be doing better with regard to her bilateral lower extremities. She has been tolerating the dressing changes without complication. Fortunately there is no signs of active infection at this time. No fevers, chills, nausea, vomiting, or diarrhea. 02/23/2019 on evaluation today patient actually appears to be doing quite well with regard to her bilateral lower extremities. She has been tolerating the dressing changes without complication. She is even used her pumps one time and states that she really felt like it felt good. With that being said she seems to be in good spirits and her legs appear to be doing excellent. 03/09/2019 on evaluation today patient appears to be doing well with regard to her right lower extremity there are no open wounds at this time she is having some discomfort but I feel like this is more neuropathy than anything. With regard to her left lower extremity she had several areas scattered around that she does have some weeping and drainage from but again overall she does not appear to be having any significant issues and no evidence of infection at this time which is good news. 03/23/2019 on evaluation today patient appears to be doing well with regard to her right lower extremity which she tells me is still close she is using her juxta light here. Her left lower extremity she mainly just has an area on the foot which is still slightly draining although this also is doing great. Overall very pleased at this time. 04/06/2019 patient appears to be doing a little bit worse in regard to her left lower extremity upon evaluation today. She feels like this could be becoming infected again which she had issues with previous. Fortunately there is no signs of systemic infection but again this is always a struggle with her with her legs she will go from doing well to not so well in a very short amount of time. 04/20/2019 on evaluation today patient actually  appears to be doing quite well with regard to her right lower extremity I do not see any signs of active infection at this time. Fortunately there is no fever chills noted. She is still taking the antibiotics which I prescribed for her at this point. In regard to the left lower extremity I do feel like some of these areas are better although again she still is having weeping from several locations at this time. 04/27/2019 on evaluation today patient appears to be doing about the same if not slightly worse in regard to her left lower extremity ulcers. She tells me when questioned that she has been sleeping in her Hoveround chair in fact she tells me she falls asleep without even knowing it. I think she is spending a whole lot of time in the chair and less time walking and moving around which is not good for her legs either. On top of that she is in a seated position which is also the worst position she is not really elevating her legs and she is also not using her lymphedema pumps. All this is good to contribute to worsening of her condition in general. 05/18/2019 on evaluation today patient appears to be doing well with regard to her lower extremity on the right in fact this is showing no signs of any open wounds at this time. On the left she is continuing to have issues with areas that do drain. Some of the regions have  healed and there are couple areas that have reopened. She did go to the ER per the patient according to recommendations from the home health nurse due to what she was seen when she came out on 05/13/2019. Subsequently she felt like the patient needed to go to the hospital due to the fact that again she was having "milky white discharge" from her leg. Nonetheless she had and then was placed on doxycycline and subsequently seems to be doing better. 06/01/2019 upon evaluation today patient appears to be doing really in my opinion about the same. I do not see any signs of active infection  which is good news. Overall she still has wounds over the bilateral lower extremities she has reopened on the right but this appears to be more of a crack where there is weeping/edema coming from the region. I do not see any evidence of infection at either site based on what I visualized today. 07/13/2019 upon evaluation today patient appears to be doing a little worse compared to last time I saw her. She since has been in the hospital from 06/21/2019 through 06/29/2019. This was secondary to having Covid. During that time they did apply lotion to her legs which unfortunately has caused her to develop a myriad of open wounds on her lower extremities. Her legs do appear to be doing better as far as the overall appearance is concerned but nonetheless she does have more open and weeping areas. 07/27/2019 upon evaluation today patient appears to be doing more poorly to be honest in regard to her left lower extremity in particular. There is no signs of systemic infection although I do believe she may have local infection. She notes she has been having a lot of blue/green drainage which is consistent potentially with Pseudomonas. That may be something that we need to consider here as well. The doxycycline does not seem to have been helping. 08/03/2019 upon evaluation today patient appears to be doing a little better in my opinion compared to last week's evaluation. Her culture I did review today and she is on appropriate medications to help treat the Enterobacter that was noted. Overall I feel like that is good news. With that being said she is unfortunately continuing to have a lot of drainage and though it is doing better I still think she has a long ways to go to get things dried up in general. Fortunately there is no signs of systemic infection. 08/10/2019 upon evaluation today patient appears to be doing may be slightly better in regard to her left lower extremity the right lower extremity is doing  much better. Fortunately there is no signs of infection right now which is good news. No fevers, chills, nausea, vomiting, or diarrhea. 08/24/2019 on evaluation today patient appears to be doing slightly better in regard to her lower extremities. The left lower extremity seems to be healed the right lower extremity is doing better though not completely healed as far as the openings are concerned. She has some generalized issues here with edema and weeping secondary to her lymphedema though again I do believe this is little bit drier compared to prior weeks evaluations. In general I am very pleased with how things seem to be progressing. No fevers, chills, nausea, vomiting, or diarrhea. 08/31/2019 upon evaluation today patient actually seems to making some progress here with regard to the left lower extremity in particular. She has been tolerating the dressing changes without complication. Fortunately there is no signs of active infection at this  time. No fevers, chills, nausea, vomiting, or diarrhea. She did see Dr. Doren Custard and he did note that she did have a issue with the left great saphenous vein and the small saphenous vein in the leg. With that being said he was concerned about the possibility of laser ablation not being extremely successful. He also mentioned a small risk of DVT associated with the procedure. However if the wounds do not continue to improve he stated that that would probably be the way to go. Fortunately the patient's legs do seem to be doing much better. 09/07/2019 upon evaluation today patient appears to be doing better with regard to her lower extremities. She has been tolerating the dressing changes without complication. With that being said she is showing signs of improvement and overall very pleased. There are some areas on her leg that I think we do need to debride we discussed this last week the patient is in agreement with doing that as long as it does not hurt too  badly. 09/14/2019 upon evaluation today patient appears to be doing decently well with regard to her left lower extremity. She is not having near as much weeping as she has had in the past things seem to be drying up which is good news. There is no signs of active infection at this time. 09/21/19 upon evaluation today patient appears to be doing better in regard overall to her bilateral lower extremities. She again has less open than she did previous and each week I feel like this is getting better. Fortunately there is no signs of active infection at this time. No fevers, chills, nausea, vomiting, or diarrhea. 09/28/2019 upon evaluation today patient actually appears to be showing signs of improvement with regard to her left lower extremity. Unfortunately the right medial lower extremity around the ankle region has reopened to some degree but this appears to be minimal still which is good news. There is no signs of active infection at this time which is also good news. 10/12/2019 upon evaluation today patient appears to be doing okay with regard to her bilateral lower extremities today. The right is a little bit worse then last evaluation 2 weeks ago. The left is actually doing a little better in my opinion. Overall there is no signs of active infection at this time that I see. Obviously that something we have to keep a close eye on she is very prone to this with the significant and multiple openings that she has over the bilateral lower extremities. 10/19/2019 upon evaluation today patient appears to be doing about the best that I have seen her in quite some time. She has been tolerating the dressing changes without complication. There does not appear to be any signs of active infection and overall I am extremely happy with the way her legs appeared. She is drying up quite nicely and overall is having less pain. 11/09/2019 upon evaluation today patient appears to be doing better in regard to her wounds.  She seems to be drying up more and more each time I see her this is just taking a very long time. Fortunately there is no signs of active infection at this time. 11/23/2019 upon evaluation today patient actually appears to be doing excellent in regard to her lower extremities at this point compared to where she has been. Fortunately there is no signs of active infection at this time. She did go to the hospital last week for nausea and vomiting completely unrelated to her wounds. Fortunately she  is doing better she was given some Reglan and got better. She had associated abdominal pain but they never found out what was going on. 12/07/2019 upon evaluation today patient appears to be doing well for the most part in regard to her legs. She unfortunately has not been keeping the [place] portion of her wraps on therefore the compression has not really been sufficient for what it is supposed to be. Nonetheless she tells me that it just hurt too bad therefore she removed it. 12/21/2019 upon evaluation today patient actually appears to be doing quite well with regard to her legs. I do feel like she has been making progress which is great news and overall there is no signs of active infection at this time. No fevers, chills, nausea, vomiting, or diarrhea. 01/04/2020 upon evaluation today patient presents for follow-up concerning her lower extremity edema bilaterally. She still has open wounds she has not been using her lymphedema pumps. She is also not been utilizing her compression wraps appropriately she tends to unwrap them, take them off, or states that they hurt. Obviously the reason they hurt is because her legs start to swell but the issue is if she would use her compression/lymphedema pumps regularly she would not swell and she would have the pain. Nonetheless she has not even picked them up once honestly over the past several months and may be even as much as in the past year based on my opinion and what have  seen. She tells me today that after last week when I talked about this with her specifically actually that was 2 weeks ago that she "forgot". 8//21 on evaluation today patient appears to be doing a little better in regard to her legs bilaterally. Fortunately there is no signs of active infection at this time. She tells me that she used her lymphedema pumps all of one time over the past 2 weeks since I last saw her. She tells me that she has been too busy in order to continue to use these. 02/01/2020 on evaluation today patient appears to be doing some better in regard to her wounds in general in her legs. We felt the right was healed although is not completely it does appear to be doing better she tells me she has been using her lymphedema pumps that she has had this six times since I last saw her. Obviously the more she does that the better she would do my opinion 02/15/2020 upon evaluation today patient appears to be doing about the same in regard to her legs. She tells me that she is pumping I'm still not sure how much she does to be perfectly honest. However even if she does a little bit here and there I guess that is better than nothing. Fortunately there is no sign of active infection at this time which is great news. No fevers, chills, nausea, vomiting, or diarrhea. 02/29/2020 on evaluation today patient actually appears to be doing quite well all things considered this week. She has been tolerating the dressing changes without complication. Fortunately there is no signs of active infection at this time. No fevers, chills, nausea, vomiting, or diarrhea. 03/14/2020 upon evaluation today patient appears to be doing really about the same in regard to her legs. There is no signs of improvement overall and she as noted from home health does not appear to be elevating her legs he can get into her lift chair. There is too much stuff piled up on it the patient tells me.  She also tells me she cannot really  use her pumps effectively due to the fact that she cannot have any space to get them on. Finally she is also not really elevating her legs because she is not sleeping in her bed she is sleeping in her chair currently and again overall I think everything that she is done in combination has been exactly the wrong thing for what she needs for her legs. 04/04/2020 upon evaluation today patient appears to be doing well at this time with regard to her legs. She is actually been pumping, keeping her wraps on, and to be honest she seems to be doing dramatically better the right leg is excellent the left leg is also excellent and measuring much smaller than previous. 04/18/2020 upon evaluation today patient actually is continue to make good progress in regard to her lower extremities bilaterally. Everything is improving and less wet that has been in the past overall I am extremely pleased with where things stand and I think that she is making great progress. The patient tells me she still continue to use her compression pumps 05/02/2020 on evaluation today patient appears to be doing well at this time in regard to her left leg which is showing signs of drying up. With that being said she does have a lot of lymphedema type crusty skin around the toes of her left foot and the right medial ankle which has opened at this point. Fortunately there is no signs of active infection systemically at this point or even locally for that matter. 05/23/2020 on evaluation today patient appears to be doing well with regard to her lower extremities. Fortunately there is no signs of active infection at this time. No fever chills noted. She has been very depressed however she tells me. 06/06/2020 patient came in today for evaluation in regard to her bilateral lower extremity ulcerations. With that being said she came in feeling okay and actually laughing and joking around with the staff checking her in. Subsequently however she had  a coughing spell and following the coughing spell it was a dramatic conversion from being jovial and joking around to being extremely short of breath her vital signs actually dropped in regard to her blood pressure from around 175 to down around 449 for systolic and from around 96 diastolic down to around 70. With that being said she also accompanied this with an increase in her respiratory rate which was also quite significant. Nonetheless I actually upon going into see her was extremely worried we called EMS to have her transported to the ER for further evaluation and treatment. She continued until EMS got here to be extremely short of breath even on 6 L of oxygen. Her oxygen saturation did come up to 99% but overall it was only 95 before which was not terrible she did feel like the oxygen helped her feel somewhat better however. She has a history of asthma but again even the coughing spell really should not have done this degree of alteration in her demeanor from where she was just before to after the coughing spell. She tells [place] however she has been feeling somewhat abnormal since Friday although she could not really pinpoint exactly what was going on. 06/27/2020 plan evaluation today patient appears to be doing okay in regard to her leg ulcers. She is really not showing a lot of improvement to be honest and she still has a lot of weeping. With that being said after I last saw her  we called EMS to take her to the hospital she actually did not go she went to see her primary care provider who according to the patient have not identified and read through the note states that everything checked out okay. Nonetheless the patient has continued to have bouts where she gets very short of breath for seemingly no reason whatsoever. That happened even the same night that I saw her last time. This was after she refused transport to the hospital. With that being said she also tells me that just getting dressed to  come here is quite a chore which is putting on her shirt causing her to have to stop to catch her breath. Obviously this is not normal and I feel like there is something going on. She may need a referral to be seen by cardiology ASAP. She does see her primary care provider early next week she tells me 07/11/2020 upon evaluation today patient's wounds again appear to be doing about the same she mainly has weeping of the bilateral lower extremities both are very swollen today she tells me she has been using her pumps but last time I saw her she told me she did not even have her pumps out that she had "packed them away because they had too much stuff in their house. Apparently her husband has been bringing stuff in from storage units. She then subsequently told me that she had so much stuff in her house that she has been staying up all night and did not go to bed till 7:00 this morning because she was cleaning up stuff. Nonetheless I am still unsure as to whether or not she is using the pumps based on what I am seeing I would think probably not. Nonetheless she has been keeping the compression wraps in place that is at least something 07/25/2020 upon evaluation today patient appears to be doing well currently in regard to her legs all things considered. I do not think she is doing any worse significantly although honestly I do not think she is doing a lot better either. There does not appear to be any signs of infection which is good news although she does have a lot of weeping. She is using her pumps she tells me sporadically though she says that her husband is not a big fan of helping her to get the [place]. She also tells me is difficult because in their house. It would be easiest for her to use these as where her son is actually staying with her and living out at this point. Obviously I understand that she is having a lot of issues here and to be honest I do not really know what to say in that regard and  the fact that I really cannot change the situations but I do believe she really needs to be using the lymphedema pumps to try to keep things under control here. Outside of that I think she is going to continue to have significant issues. I think the pumps coupled with good compression and elevation are the only things that are to help her at this point. 08/08/2020 upon evaluation today patient appears to be doing a little worse in regard to her legs in general. I think this may be due to some infection currently. With that being said I am going to go ahead and likely see about putting her on an antibiotic. Were also can obtain a culture today where she had some purulent drainage. 08/29/2020 upon evaluation today  patient appears to be doing about the same in regard to her bilateral lower extremities. Unfortunately she is continuing to have significant issues here with edema and she has significant lymphedema. With that being said she is really not doing anything that she is supposed to be doing as far as elevation, compression, using lymphedema pumps or anything really for that matter. She does not use her pumps, is taken the wraps off shortly after having them put on as far as home health is concerned at least by the next day she tells me, and overall is not really elevating her legs she is telling me she has a lot to do as far as cleaning up in her house. Nonetheless this triad of noncompliance is leading to worsening not improving in regard to her legs. I had a very strong conversation with her about this today again. 09/12/2020 on evaluation today patient appears to be doing poorly with regard to her lower extremities bilaterally. Fortunately there does not appear to be any signs of active infection at this point. That is systemically. Locally there is some signs of pus coming from an area on her left leg. She has been on antibiotics recently though she is done with those currently. Nonetheless she  tells me she has been having increased pain with the right leg this is extremely swollen as well. Fortunately there does not appear to be significant infection of the right leg though I think her pain is really as result of the increased swelling she has been declining the wrap on the right leg and the wounds here are getting significantly worse week by week. This obviously is not will be want to see. I discussed with the patient however if she does not use the wraps, use her pumps, and elevate her legs that she is not to get better. She has been sleeping in her motorized Hoveround which does not even allow her to elevate her legs at all. She tells me that she is also constantly "cleaning up as her husband told her that he wants to move back to [place]." Unfortunately this has been the narrative for the past several months in fact that she tells me that she has been having to clean up mass that was moved in from storage units into their home and that she does not really sleep and she never really elevates her legs. I explained to the patient today that if she is not can I do any of this that there is really nothing that I can do to help her. 09/26/20 upon evaluation today patient appears to be doing about the same in regard to her wounds. Fortunately there is no signs of active infection at this time. No fever chills noted. She has been tolerating the dressing changes without complication which is good news. With that being said I do think that she unfortunately is still having a lot of pain but I think this is due to the swelling that really is not controlled and as I discussed with her last time she needs to be using her lymphedema pumps there is still in the box she has not even attempted to use those whatsoever. I am also not even certain she is taking her fluid pills as she states she may need to have "get a refill" she was seen with her husband at the appointment today. 10/24/2020 upon  evaluation today patient appears to be doing decently well in regard to her legs all things considered. She  has been tolerating the dressing changes without complication. Fortunately there is no signs of active infection at this time. No fevers, chills, nausea, vomiting, or diarrhea. With that being said the patient does appear to have some excessive thickened skin buildup currently that has been require some sharp debridement to clear this away. I am hopeful if we do this we will be able to get some of this area to dry out this otherwise trapping fluid underneath. Her son is present during the office visit today. She tells me she still been doing a lot as far as cleaning up and going through boxes she tells me she has been up for a long time already this morning. 5/25; patient presents for 2-week follow-up. She had 3 layer compression wrap placed with calcium alginate underneath. She reports tolerating the wraps well. She has home health that changes the wrap 2-3 times a week. She denies signs of infection. She has almost finished her course of antibiotics. She uses her lymphedema pumps once weekly. 11/21/2020 upon evaluation today patient appears to be doing well with regard to her legs all things considered. Fortunately there is no signs of active infection at this time which is great news I do not see any evidence of infection and overall I think that she is definitely improved compared to where things did previous. Nonetheless I do think that she may benefit from removing some of the thicker skin on the legs. Again we have attempted this in the past to some degree but I think that we might be able to more effectively do this of a clear some way and then potentially use a urea cream with salicylic acid to try to help clear some of this away. The patient is in agreement with the plan. 12/05/2020 upon evaluation today patient appears to be doing well with regard to her wounds in general. I feel like that  things are somewhat improved the skin looking a little better. With that being said she still has significant lymphedema. She is not been using her lymphedema pumps and not really been elevating her legs. She also did have some maggots noted on the left leg she tells me that the "gnats are terrible and get all over her foot." With that being said I am concerned about the fact that the patient still is quite swollen even with the compression wraps I really feel like she needs to be using her pumps but I cannot talk her into doing this. 12/19/2020 upon evaluation today patient appears to be doing about the same in regard to her legs. Fortunately I do not see any signs of infection unfortunately she really seems to be very nonchalant about the fact that she has not been using her lymphedema pumps. She tells me that "I just forget". With that being said this needs to be something that is a priority for her I cannot count the number of times that have had this discussion with her and yet nothing seems to change. With that being said I think that is very unlikely she is ever really getting healed with regard to her wounds to be honest she does not seem to be motivated to do anything to try to help at this point despite everything that I have tried to do and recommend for her. 01/02/2021 upon evaluation today patient actually appears to be doing excellent in regard to her wounds. She has been tolerating the dressing changes without complication. Fortunately there does not appear to be any  signs of active infection at this time which is great news. No fevers, chills, nausea, vomiting, or diarrhea. Objective Constitutional Well-nourished and well-hydrated in no acute distress. Vitals Time Taken: 11:03 AM, Height: 62 in, Weight: 335 lbs, BMI: 61.3, Temperature: 99 F, Pulse: 72 bpm, Respiratory Rate: 20 breaths/min, Blood Pressure: 137/83 mmHg. Respiratory normal breathing without  difficulty. Psychiatric this patient is able to make decisions and demonstrates good insight into disease process. Alert and Oriented x 3. pleasant and cooperative. General Notes: Patient's wound bed showed signs of good granulation epithelization at this point. There does not appear to be any evidence of worsening infection he is doing significantly better I am actually very pleased with where everything stands at this point. No fevers, chills, nausea, vomiting, or diarrhea. Integumentary (Hair, Skin) Wound #61 status is Open. Original cause of wound was Gradually Appeared. The date acquired was: 04/20/2019. The wound has been in treatment 89 weeks. The wound is located on the Left,Circumferential Lower Leg. The wound measures 8cm length x 12cm width x 0.1cm depth; 75.398cm^2 area and 7.54cm^3 volume. There is Fat Layer (Subcutaneous Tissue) exposed. There is no tunneling or undermining noted. There is a medium amount of serous drainage noted. The wound margin is indistinct and nonvisible. There is large (67-100%) pink, pale granulation within the wound bed. There is no necrotic tissue within the wound bed. Wound #64 status is Open. Original cause of wound was Gradually Appeared. The date acquired was: 06/01/2019. The wound has been in treatment 83 weeks. The wound is located on the Left,Dorsal Foot. The wound measures 7cm length x 6cm width x 0.1cm depth; 32.987cm^2 area and 3.299cm^3 volume. There is Fat Layer (Subcutaneous Tissue) exposed. There is no tunneling or undermining noted. There is a medium amount of serous drainage noted. The wound margin is indistinct and nonvisible. There is large (67-100%) pink, pale granulation within the wound bed. There is no necrotic tissue within the wound bed. General Notes: maceration noted Wound #67 status is Open. Original cause of wound was Gradually Appeared. The date acquired was: 05/02/2020. The wound has been in treatment 35 weeks. The wound is located  on the Right,Medial Lower Leg. The wound measures 6cm length x 2cm width x 0.1cm depth; 9.425cm^2 area and 0.942cm^3 volume. There is Fat Layer (Subcutaneous Tissue) exposed. There is no tunneling or undermining noted. There is a medium amount of serosanguineous drainage noted. The wound margin is indistinct and nonvisible. There is large (67-100%) pink, pale granulation within the wound bed. There is a small (1-33%) amount of necrotic tissue within the wound bed including Adherent Slough. 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 There was a Three Layer Compression Therapy Procedure by Deon Pilling, RN. Post procedure Diagnosis Wound #61: Same as Pre-Procedure Wound #67 Pre-procedure diagnosis of Wound #67 is a Diabetic Wound/Ulcer of the Lower Extremity located on the  Right,Medial Lower Leg . There was a Three Layer Compression Therapy Procedure by Deon Pilling, RN. Post procedure Diagnosis Wound #67: Same as Pre-Procedure Plan Follow-up Appointments: Return Appointment in 2 weeks. Bathing/ Shower/ Hygiene: May shower and wash wound with soap and water. - with dressing changes, wash both legs with wash cloth and soap and water with dressing  changes Other Bathing/Shower/Hygiene Orders/Instructions: - Home health to wash legs with warn soapy water and a clean wash cloth with dressing changes Edema Control - Lymphedema / SCD / Other: Lymphedema Pumps. Use Lymphedema pumps on leg(s) 2-3 times a day for 45-60 minutes. If wearing any wraps or hose, do not remove them. Continue exercising as instructed. Elevate legs to the level of the heart or above for 30 minutes daily and/or when sitting, a frequency of: - especially at night Avoid standing for long periods of time. Exercise regularly Home Health: No change in wound care orders this week; continue Home Health for wound care. May utilize formulary equivalent dressing for wound treatment orders unless otherwise specified. - add urea cream to legs with dressing changes when available Dressing changes to be completed by [facility] on Monday / Wednesday / Friday except when patient has scheduled visit at [facility]. Other Home Health Orders/Instructions: - [facility] WOUND #61: - Lower Leg Wound Laterality: Left, Circumferential Peri-Wound Care: Triamcinolone 15 (g) 3 x Per Week/30 Days Discharge Instructions: Use triamcinolone 15 (g) thin layer on thick dry skin Peri-Wound Care: Sween Lotion (Moisturizing lotion) (Home Health) 3 x Per Week/30 Days Discharge Instructions: Apply moisturizing lotion to dry skin on legs Prim Dressing: Maxorb Extra Calcium Alginate 2x2 in (Home Health) 3 x Per Week/30 Days ary Discharge Instructions: Apply calcium alginate to wound bed as instructed Secondary Dressing: ABD Pad, 8x10 (Home Health) 3 x Per Week/30 Days Discharge Instructions: Apply over primary dressing as directed. Secondary Dressing: Zetuvit Plus 4x4 in (Home Health) 3 x Per Week/30 Days Discharge Instructions: or equivalent extra absorbent pad.Apply over primary dressing as needed. Com pression Wrap: ThreePress (3 layer compression wrap) (Home Health) 3 x Per Week/30 Days Discharge  Instructions: Apply three layer compression as directed. Pad bend of ankle with foam or ABD pad. WOUND #64: - Foot Wound Laterality: Dorsal, Left Peri-Wound Care: Triamcinolone 15 (g) 3 x Per Week/30 Days Discharge Instructions: Use triamcinolone 15 (g) thin layer on thick dry skin Peri-Wound Care: Sween Lotion (Moisturizing lotion) (Home Health) 3 x Per Week/30 Days Discharge Instructions: Apply moisturizing lotion to dry skin on legs Prim Dressing: Maxorb Extra Calcium Alginate 2x2 in (Home Health) 3 x Per Week/30 Days ary Discharge Instructions: Apply calcium alginate to wound bed as instructed Secondary Dressing: ABD Pad, 8x10 (Home Health) 3 x Per Week/30 Days Discharge Instructions: Apply over primary dressing as directed. Secondary Dressing: Zetuvit Plus 4x4 in (Home Health) 3 x Per Week/30 Days Discharge Instructions: or equivalent extra absorbent pad.Apply over primary dressing as needed. Com pression Wrap: ThreePress (3 layer compression wrap) (Home Health) 3 x Per Week/30 Days Discharge Instructions: Apply three layer compression as directed. Pad bend of ankle with foam or ABD pad. WOUND #67: - Lower Leg Wound Laterality: Right, Medial Peri-Wound Care: Triamcinolone 15 (g) 3 x Per Week/30 Days Discharge Instructions: Use triamcinolone 15 (g) thin layer on thick dry skin Peri-Wound Care: Sween Lotion (Moisturizing lotion) (Home Health) 3 x Per Week/30 Days Discharge Instructions: Apply moisturizing lotion to dry skin on legs Prim Dressing: Maxorb Extra Calcium Alginate 2x2 in [facility]) 3  x Per Week/30 Days ary Discharge Instructions: Apply calcium alginate to wound bed as instructed Secondary Dressing: ABD Pad, 8x10 (Home Health) 3 x Per Week/30 Days Discharge Instructions: Apply over primary dressing as directed. Secondary Dressing: Zetuvit Plus 4x4 in (Home Health) 3 x Per Week/30 Days Discharge Instructions: or equivalent extra absorbent pad.Apply over primary dressing as  needed. Com pression Wrap: ThreePress (3 layer compression wrap) (Home Health) 3 x Per Week/30 Days Discharge Instructions: Apply three layer compression as directed. Pad bend of ankle with foam or ABD pad. 1. Would recommend currently that we go ahead and have the patient continue with the wound care measures as before. I think using the triamcinolone to the legs in general and a thin film is still a good thing to help with the quality of the skin. 2. With regard to the dressings we will continue to use some of the silver alginate dressings over any open draining areas. With that being said there are much fewer of these than before. 3. I am also can recommend continuation of ABD pads and Zetuvit as needed to help with the drainage control. 4. I would recommend continuation of the 3 layer compression wraps bilaterally this does need to be wrapped to 3 finger widths below the knee. It was a little low today. Otherwise she was doing quite well. 5. I am also can recommend the patient should continue to use her lymphedema pumps that has made a big improvement in the past 2 weeks since I last saw her. We will see patient back for reevaluation in 2 weeks here in the clinic. If anything worsens or changes patient will contact our office for additional recommendations. Electronic Signature(s) Signed: 01/02/2021 12:54:36 PM By: [name] PA-C Entered By: [name] on 01/02/2021 12:54:36 -------------------------------------------------------------------------------- SuperBill Details Patient Name: Date of Service: [name] 01/02/2021 Medical Record Number: [id number] Patient Account Number: [id number] Date of Birth/Sex: Treating RN: [birth year] (70 y.o. [name] Primary Care Provider: [name] Other Clinician: Referring Provider: Treating Provider/Extender: Darlen Round in Treatment: 178 Diagnosis Coding ICD-10 Codes Code Description E11.622  Type 2 diabetes mellitus with other skin ulcer I89.0 Lymphedema, not elsewhere classified I87.331 Chronic venous hypertension (idiopathic) with ulcer and inflammation of right lower extremity I87.332 Chronic venous hypertension (idiopathic) with ulcer and inflammation of left lower extremity L97.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 leg with fat layer exposed L97.822 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 I10 Essential (primary) hypertension E66.01 Morbid (severe) obesity due to excess calories F41.8 Other specified anxiety disorders R53.1 Weakness Facility Procedures Physician Procedures : CPT4 Code Description Modifier [id number] - WC PHYS LEVEL 3 - EST PT ICD-10 Diagnosis Description E11.622 Type 2 diabetes mellitus with other skin ulcer I89.0 Lymphedema, not elsewhere classified I87.331 Chronic venous hypertension (idiopathic) with  ulcer and inflammation of right lower extremity I87.332 Chronic venous hypertension (idiopathic) with ulcer and inflammation of left lower extremity Quantity: 1 Electronic Signature(s) Signed: 01/02/2021 12:54:55 PM By: [name] PA-C Entered By: [name] on 01/02/2021 12:54:52

## 2021-01-02 NOTE — Telephone Encounter (Signed)
Called and lvm and sent a my chart message with this information

## 2021-01-03 NOTE — Progress Notes (Signed)
[name], [name] ([id number]) Visit Report for 01/02/2021 Arrival Information Details Patient Name: Date of Service: [name] 01/02/2021 10:45 A M Medical Record Number: [id number] Patient Account Number: [id number] Date of Birth/Sex: Treating RN: Mar 25, 1951 (70 y.o. [name] Primary Care Cristela Stalder: [name] Other Clinician: Referring Rahmon Heigl: Treating Taraneh Metheney/Extender: Darlen Round in Treatment: 24 Visit Information History Since Last Visit Added or deleted any medications: No Patient Arrived: Wheel Chair Any new allergies or adverse reactions: No Arrival Time: 10:58 Had a fall or experienced change in No Transfer Assistance: None activities of daily living that may affect Patient Identification Verified: Yes risk of falls: Secondary Verification Process Completed: Yes Signs or symptoms of abuse/neglect since last visito No Patient Requires Transmission-Based Precautions: No Hospitalized since last visit: No Patient Has Alerts: Yes Implantable device outside of the clinic excluding No Patient Alerts: R ABI= 1.01 cellular tissue based products placed in the center L ABI = .99 since last visit: Has Dressing in Place as Prescribed: Yes Has Compression in Place as Prescribed: Yes Pain Present Now: Yes Electronic Signature(s) Signed: 01/02/2021 5:31:35 PM By: Lorrin Jackson Entered By: Lorrin Jackson on 01/02/2021 11:04:27 -------------------------------------------------------------------------------- Compression Therapy Details Patient Name: Date of Service: [name] 01/02/2021 10:45 A M Medical Record Number: [id number] Patient Account Number: [id number] Date of Birth/Sex: Treating RN: Feb 23, 1951 (70 y.o. [name] Primary Care Jameya Pontiff: [name] Other Clinician: Referring Carmel Waddington: Treating Kamirah Shugrue/Extender: Darlen Round in Treatment: 178 Compression Therapy Performed for Wound  Assessment: Wound #61 Left,Circumferential Lower Leg Performed By: Clinician Deon Pilling, RN Compression Type: Three Layer Post Procedure Diagnosis Same as Pre-procedure Electronic Signature(s) Signed: 01/02/2021 6:23:27 PM By: [name] RN, BSN Entered By: [name] on 01/02/2021 11:36:18 -------------------------------------------------------------------------------- Compression Therapy Details Patient Name: Date of Service: [name] 01/02/2021 10:45 A M Medical Record Number: [id number] Patient Account Number: [id number] Date of Birth/Sex: Treating RN: 29-Jan-1951 (70 y.o. [name] Primary Care Wenceslaus Gist: [name] Other Clinician: Referring Rawad Bochicchio: Treating Tyreece Gelles/Extender: Darlen Round in Treatment: 178 Compression Therapy Performed for Wound Assessment: Wound #67 Right,Medial Lower Leg Performed By: Clinician Deon Pilling, RN Compression Type: Three Layer Post Procedure Diagnosis Same as Pre-procedure Electronic Signature(s) Signed: 01/02/2021 6:23:27 PM By: [name] RN, BSN Entered By: [name] on 01/02/2021 11:36:18 -------------------------------------------------------------------------------- Lower Extremity Assessment Details Patient Name: Date of Service: [name]. 01/02/2021 10:45 A M Medical Record Number: [id number] Patient Account Number: [id number] Date of Birth/Sex: Treating RN: Jan 12, 1951 (70 y.o. [name] Primary Care Norah Devin: [name] Other Clinician: Referring Shawndell Schillaci: Treating Dorena Dorfman/Extender: Darlen Round in Treatment: 178 Edema Assessment Assessed: [name]: Yes] [name]: Yes] Edema: [Left: Yes] [Right: Yes] Calf Left: Right: Point of Measurement: 33 cm From Medial Instep 41.4 cm 40.5 cm Ankle Left: Right: Point of Measurement: 9 cm From Medial Instep 29 cm 25 cm Vascular Assessment Pulses: Dorsalis Pedis Palpable: [Left:Yes]  [Right:Yes] Electronic Signature(s) Signed: 01/02/2021 5:31:35 PM By: Lorrin Jackson Entered By: Lorrin Jackson on 01/02/2021 11:12:47 -------------------------------------------------------------------------------- Multi-Disciplinary Care Plan Details Patient Name: Date of Service: [name]. 01/02/2021 10:45 A M Medical Record Number: [id number] Patient Account Number: [id number] Date of Birth/Sex: Treating RN: Nov 09, 1950 (70 y.o. [name] Primary Care Gomer France: [name] Other Clinician: Referring Harlowe Dowler: Treating Jalayna Josten/Extender: Darlen Round in Treatment: [place] reviewed with physician Active Inactive Venous Leg Ulcer Nursing Diagnoses: Actual venous Insuffiency (use  after diagnosis is confirmed) Knowledge deficit related to disease process and management Goals: Patient will maintain optimal edema control Date Initiated: 08/12/2017 Target Resolution Date: 01/16/2021 Goal Status: Active Patient/caregiver will verbalize understanding of disease process and disease management Date Initiated: 08/12/2017 Date Inactivated: 04/21/2018 Target Resolution Date: 04/24/2018 Goal Status: Met Interventions: Assess peripheral edema status every visit. Compression as ordered Treatment Activities: Therapeutic compression applied : 08/12/2017 Notes: Wound/Skin Impairment Nursing Diagnoses: Impaired tissue integrity Knowledge deficit related to ulceration/compromised skin integrity Goals: Patient/caregiver will verbalize understanding of skin care regimen Date Initiated: 08/12/2017 Target Resolution Date: 01/16/2021 Goal Status: Active Ulcer/skin breakdown will have a volume reduction of 30% by week 4 Date Initiated: 08/05/2017 Date Inactivated: 09/30/2017 Target Resolution Date: 10/03/2017 Goal Status: Met Ulcer/skin breakdown will have a volume reduction of 50% by week 8 Date Initiated: 09/30/2017 Date Inactivated:  10/28/2017 Target Resolution Date: 10/28/2017 Goal Status: Met Interventions: Assess patient/caregiver ability to perform ulcer/skin care regimen upon admission and as needed Assess ulceration(s) every visit Provide education on ulcer and skin care Screen for HBO Treatment Activities: Patient referred to home care : 08/05/2017 Skin care regimen initiated : 08/05/2017 Topical wound management initiated : 08/05/2017 Notes: Electronic Signature(s) Signed: 01/02/2021 6:23:27 PM By: [name] RN, BSN Entered By: [name] on 01/02/2021 11:34:07 -------------------------------------------------------------------------------- Pain Assessment Details Patient Name: Date of Service: [name]. 01/02/2021 10:45 A M Medical Record Number: [id number] Patient Account Number: [id number] Date of Birth/Sex: Treating RN: 24-Aug-1950 (70 y.o. [name] Primary Care Merlene Dante: [name] Other Clinician: Referring Natilee Gauer: Treating Gustave Lindeman/Extender: Darlen Round in Treatment: [phone] Active Problems Location of Pain Severity and Description of Pain Patient Has Paino Yes Site Locations Pain Location: Pain in Ulcers With Dressing Change: Yes Duration of the Pain. Constant / Intermittento Intermittent Rate the pain. Current Pain Level: 3 Worst Pain Level: 9 Character of Pain Describe the Pain: Burning, Throbbing Pain Management and Medication Current Pain Management: Medication: Yes Cold Application: No Rest: Yes Massage: No Activity: No T.E.N.S.: No Heat Application: No Leg drop or elevation: No Is the Current Pain Management Adequate: Inadequate How does your wound impact your activities of daily livingo Sleep: Yes Bathing: No Appetite: No Relationship With Others: No Bladder Continence: No Emotions: No Bowel Continence: No Work: No Toileting: No Drive: No Dressing: No Hobbies: No Electronic Signature(s) Signed: 01/02/2021 5:31:35  PM By: Lorrin Jackson Entered By: Lorrin Jackson on 01/02/2021 11:05:28 -------------------------------------------------------------------------------- Patient/Caregiver Education Details Patient Name: Date of Service: [name] 7/20/2022andnbsp10:45 A M Medical Record Number: [id number] Patient Account Number: [id number] Date of Birth/Gender: Treating RN: Nov 10, 1950 (70 y.o. [name] Primary Care Physician: [name] Other Clinician: Referring Physician: Treating Physician/Extender: Darlen Round in Treatment: [phone] Education Assessment Education Provided To: Patient Education Topics Provided Venous: Methods: Explain/Verbal Responses: Reinforcements needed, State content correctly Wound/Skin Impairment: Methods: Explain/Verbal Responses: Reinforcements needed, State content correctly Electronic Signature(s) Signed: 01/02/2021 6:23:27 PM By: [name] RN, BSN Entered By: [name] on 01/02/2021 11:34:37 -------------------------------------------------------------------------------- Wound Assessment Details Patient Name: Date of Service: [name]. 01/02/2021 10:45 A M Medical Record Number: [id number] Patient Account Number: [id number] Date of Birth/Sex: Treating RN: Jun 14, 1951 (70 y.o. [name] Primary Care Baily Hovanec: [name] Other Clinician: Referring Niesha Bame: Treating Andreika Vandagriff/Extender: Darlen Round in Treatment: 178 Wound Status Wound Number: 61 Primary Venous Leg Ulcer Etiology: Wound Location: Left, Circumferential Lower Leg Wound Open Wounding Event: Gradually Appeared Status: Date Acquired: 04/20/2019  Comorbid Asthma, Hypertension, Peripheral Arterial Disease, Peripheral Weeks Of Treatment: 89 History: Venous Disease, Type II Diabetes, Gout, Osteoarthritis Clustered Wound: Yes Wound Measurements Length: (cm) 8 Width: (cm) 12 Depth: (cm) 0.1 Area: (cm)  75.398 Volume: (cm) 7.54 % Reduction in Area: -900% % Reduction in Volume: -900% Epithelialization: None Tunneling: No Undermining: No Wound Description Classification: Full Thickness Without Exposed Support Structures Wound Margin: Indistinct, nonvisible Exudate Amount: Medium Exudate Type: Serous Exudate Color: amber Foul Odor After Cleansing: No Slough/Fibrino No Wound Bed Granulation Amount: Large (67-100%) Exposed Structure Granulation Quality: Pink, Pale Fascia Exposed: No Necrotic Amount: None Present (0%) Fat Layer (Subcutaneous Tissue) Exposed: Yes Tendon Exposed: No Muscle Exposed: No Joint Exposed: No Bone Exposed: No Electronic Signature(s) Signed: 01/02/2021 5:31:35 PM By: Lorrin Jackson Entered By: Lorrin Jackson on 01/02/2021 11:14:15 -------------------------------------------------------------------------------- Wound Assessment Details Patient Name: Date of Service: [name] 01/02/2021 10:45 A M Medical Record Number: [id number] Patient Account Number: [id number] Date of Birth/Sex: Treating RN: 1950-08-18 (70 y.o. [name] Primary Care Cavon Nicolls: [name] Other Clinician: Referring Louine Tenpenny: Treating Shellsea Borunda/Extender: Darlen Round in Treatment: 178 Wound Status Wound Number: 64 Primary Diabetic Wound/Ulcer of the Lower Extremity Etiology: Wound Location: Left, Dorsal Foot Wound Open Wounding Event: Gradually Appeared Status: Date Acquired: 06/01/2019 Comorbid Asthma, Hypertension, Peripheral Arterial Disease, Peripheral Weeks Of Treatment: 83 History: Venous Disease, Type II Diabetes, Gout, Osteoarthritis Clustered Wound: No Wound Measurements Length: (cm) 7 Width: (cm) 6 Depth: (cm) 0.1 Area: (cm) 32.987 Volume: (cm) 3.299 % Reduction in Area: -19892.1% % Reduction in Volume: -6632.7% Epithelialization: Small (1-33%) Tunneling: No Undermining: No Wound Description Classification: Grade  1 Wound Margin: Indistinct, nonvisible Exudate Amount: Medium Exudate Type: Serous Exudate Color: amber Foul Odor After Cleansing: No Slough/Fibrino No Wound Bed Granulation Amount: Large (67-100%) Exposed Structure Granulation Quality: Pink, Pale Fascia Exposed: No Necrotic Amount: None Present (0%) Fat Layer (Subcutaneous Tissue) Exposed: Yes Tendon Exposed: No Muscle Exposed: No Joint Exposed: No Bone Exposed: No Assessment Notes maceration noted Electronic Signature(s) Signed: 01/02/2021 5:31:35 PM By: Lorrin Jackson Entered By: Lorrin Jackson on 01/02/2021 11:15:17 -------------------------------------------------------------------------------- Wound Assessment Details Patient Name: Date of Service: [name] 01/02/2021 10:45 A M Medical Record Number: [id number] Patient Account Number: [id number] Date of Birth/Sex: Treating RN: 09/25/1950 (70 y.o. [name] Primary Care Lettie Czarnecki: Other Clinician: [name] Referring Evelyn Moch: Treating Elliona Doddridge/Extender: Darlen Round in Treatment: 178 Wound Status Wound Number: 67 Primary Diabetic Wound/Ulcer of the Lower Extremity Etiology: Wound Location: Right, Medial Lower Leg Wound Open Wounding Event: Gradually Appeared Status: Date Acquired: 05/02/2020 Comorbid Asthma, Hypertension, Peripheral Arterial Disease, Peripheral Weeks Of Treatment: 35 History: Venous Disease, Type II Diabetes, Gout, Osteoarthritis Clustered Wound: No Wound Measurements Length: (cm) 6 Width: (cm) 2 Depth: (cm) 0.1 Area: (cm) 9.425 Volume: (cm) 0.942 % Reduction in Area: -566.5% % Reduction in Volume: -568.1% Epithelialization: Small (1-33%) Tunneling: No Undermining: No Wound Description Classification: Grade 1 Wound Margin: Indistinct, nonvisible Exudate Amount: Medium Exudate Type: Serosanguineous Exudate Color: red, brown Foul Odor After Cleansing: No Slough/Fibrino No Wound  Bed Granulation Amount: Large (67-100%) Exposed Structure Granulation Quality: Pink, Pale Fascia Exposed: No Necrotic Amount: Small (1-33%) Fat Layer (Subcutaneous Tissue) Exposed: Yes Necrotic Quality: Adherent Slough Tendon Exposed: No Muscle Exposed: No Joint Exposed: No Bone Exposed: No Electronic Signature(s) Signed: 01/02/2021 5:31:35 PM By: Lorrin Jackson Entered By: Lorrin Jackson on 01/02/2021 11:16:10 -------------------------------------------------------------------------------- Vitals Details Patient Name: Date of Service: [name]. 01/02/2021 10:45 A  M Medical Record Number: [id number] Patient Account Number: [id number] Date of Birth/Sex: Treating RN: [birth year] (70 y.o. [name] Primary Care Arrianna Catala: [name] Other Clinician: Referring Calistro Rauf: Treating Mandy Fitzwater/Extender: Darlen Round in Treatment: 178 Vital Signs Time Taken: 11:03 Temperature (F): 99 Height (in): 62 Pulse (bpm): 72 Weight (lbs): 335 Respiratory Rate (breaths/min): 20 Body Mass Index (BMI): 61.3 Blood Pressure (mmHg): 137/83 Reference Range: 80 - 120 mg / dl Electronic Signature(s) Signed: 01/02/2021 5:31:35 PM By: Lorrin Jackson Entered By: Lorrin Jackson on 01/02/2021 11:04:48

## 2021-01-16 ENCOUNTER — Encounter (HOSPITAL_BASED_OUTPATIENT_CLINIC_OR_DEPARTMENT_OTHER): Payer: Medicare PPO | Admitting: Physician Assistant

## 2021-01-17 ENCOUNTER — Telehealth: Payer: Self-pay | Admitting: Endocrinology

## 2021-01-17 NOTE — Telephone Encounter (Signed)
Patient was advised to call and provide blood sugar readings after medication change - Patient states has not exceeded 175. Please see below   Date Time Reading Notes       01/17/21 0800A 170   01/16/21 0800A 175   01/16/21 0800A 120   01/15/21 0400P 120   01/14/21 0800A 125   01/14/21 0800A 160

## 2021-01-18 NOTE — Telephone Encounter (Signed)
Called and left detailed voicemail advising pt to continue with same doses per Dr Everardo All , and follow up at her next appt.

## 2021-01-22 ENCOUNTER — Telehealth: Payer: Self-pay | Admitting: Endocrinology

## 2021-01-22 NOTE — Telephone Encounter (Signed)
Hilda with [employer] in [place] ph# [phone] called re: Patient is not currently taking a Statin medication to decrease Cardiovascular risk. Hilda requests to be called at the ph# listed above to discuss.

## 2021-01-23 ENCOUNTER — Other Ambulatory Visit: Payer: Self-pay

## 2021-01-23 ENCOUNTER — Encounter (HOSPITAL_BASED_OUTPATIENT_CLINIC_OR_DEPARTMENT_OTHER): Payer: Medicare PPO | Attending: Physician Assistant | Admitting: Physician Assistant

## 2021-01-23 DIAGNOSIS — Z6841 Body Mass Index (BMI) 40.0 and over, adult: Secondary | ICD-10-CM | POA: Insufficient documentation

## 2021-01-23 DIAGNOSIS — L97822 Non-pressure chronic ulcer of other part of left lower leg with fat layer exposed: Secondary | ICD-10-CM | POA: Diagnosis not present

## 2021-01-23 DIAGNOSIS — L97522 Non-pressure chronic ulcer of other part of left foot with fat layer exposed: Secondary | ICD-10-CM | POA: Diagnosis not present

## 2021-01-23 DIAGNOSIS — L97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 leg with fat layer exposed: Secondary | ICD-10-CM | POA: Insufficient documentation

## 2021-01-23 DIAGNOSIS — I10 Essential (primary) hypertension: Secondary | ICD-10-CM | POA: Insufficient documentation

## 2021-01-23 DIAGNOSIS — E11622 Type 2 diabetes mellitus with other skin ulcer: Secondary | ICD-10-CM | POA: Diagnosis not present

## 2021-01-23 DIAGNOSIS — I87333 Chronic venous hypertension (idiopathic) with ulcer and inflammation of bilateral lower extremity: Secondary | ICD-10-CM | POA: Diagnosis not present

## 2021-01-23 DIAGNOSIS — I890 Lymphedema, not elsewhere classified: Secondary | ICD-10-CM | POA: Diagnosis not present

## 2021-01-23 DIAGNOSIS — E1140 Type 2 diabetes mellitus with diabetic neuropathy, unspecified: Secondary | ICD-10-CM | POA: Insufficient documentation

## 2021-01-23 NOTE — Progress Notes (Addendum)
[name] ([id number]) Visit Report for 01/23/2021 Chief Complaint Document Details Patient Name: Date of Service: WEDNESDAY, [name] 01/23/2021 10:30 A M Medical Record Number: [id number] Patient Account Number: [id number] Date of Birth/Sex: Treating RN: 01/15/1951 (70 y.o. [name] Primary Care Provider: [name] Other Clinician: Referring Provider: Treating Provider/Extender: Darlen Round in Treatment: 181 Information Obtained from: Patient Chief Complaint Bilateral reoccurring LE ulcers Electronic Signature(s) Signed: 01/23/2021 10:35:16 AM By: [name] PA-C Entered By: [name] on 01/23/2021 10:35:16 -------------------------------------------------------------------------------- HPI Details Patient Name: Date of Service: [name]. 01/23/2021 10:30 A M Medical Record Number: [id number] Patient Account Number: [id number] Date of Birth/Sex: Treating RN: Oct 18, 1950 (70 y.o. [name] Primary Care Provider: [name] Other Clinician: Referring Provider: Treating Provider/Extender: Darlen Round in Treatment: 181 History of Present Illness HPI Description: this patient has been seen a couple of times before and returns with recurrent problems to her right and left lower extremity with swelling and weeping ulcerations due to not wearing her compression stockings which she had been advised to do during her last discharge, at the end of June 2018. During her last visit the patient had had normal arterial blood flow and her venous reflux study did not necessitate any surgical intervention. She was recommended compression and elevation and wound care. After prolonged treatment the patient was completely healed but she has been noncompliant with wearing or compressions.. She was here last week with an outpatient return visit planned but the patient came in a very poor general condition with altered  mental status and was rushed to the ER on my request. With a history of hypertension, diabetes, TIA and right-sided weakness she was set up for an MRI on her brain and cervical spine and was sent to [facility]. Getting an MRI done was very difficult but once the workup was done she was found not to have any spinal stenosis, epidural abscess or hematoma or discitis. This was radiculopathy to be treated as an outpatient and she was given a follow-up appointment. Today she is feeling much better alert and oriented and has come to reevaluate her bilateral lower extremity lymphedema and ulceration 03/25/2017 -- she was admitted to the hospital on 03/16/2017 and discharged on 03/18/2017 with left leg cellulitis and ulceration. She was started on vancomycin and Zosyn and x-ray showed no bony involvement. She was treated for a cellulitis with IV antibiotics changed to Rocephin and Flagyl and was discharged on oral Keflex and doxycycline to complete a 7 day course. Last hemoglobin A1c was 7.1 and her other ailments including hypertension got asthma were appropriately treated. 05/06/2017 -- she is awaiting the right size of compression stockings from [place] but other than that has been doing well. ====== Old notes 70 year old patient was seen one time last October and was lost to follow-up. She has recurrent problems with weeping and ulceration of her left lower extremity and has swelling of this for several years. It has been worse for the last 2 months. Past medical history is significant for diabetes mellitus type 2, hypertension, gout, morbid obesity, depressive disorders, hiatal hernia, migraines, status post knee surgery, risk of a cholecystectomy, vaginal hysterectomy and breast biopsy. She is not a smoker. As noted before she has never had a venous duplex study and an arterial ABI study was attempted but the left lower extremity was noncompressible 10/01/2016 -- had a lower extremity venous duplex  reflux evaluation which showed  no evidence of deep vein reflux in the right or left lower extremity, and no evidence of great saphenous vein reflux more than 500 ms in the right or left lower extremity, and the left small saphenous vein is incompetent but no vascular consult was recommended. review of her electronic medical records noted that the ABI was checked in July 2017 where the right ABI was normal limits and the left ABI could not be ascertained due to pain with cuff pressure but the waveforms are within normal limits. her arterial duplex study scheduled for April 27. 10/08/2016 -- the patient has various reasons for not having a compression on and for the last 3 days she has had no compression on her left lower extremity either due to pain or the lack of nursing help. She does not use her juxta lites either. 10/15/2016 -- the patient did not keep her appointment for arterial duplex study on April 27 and I have asked her to reschedule this. Her pain is out of proportion with the physical findings and she continuously fails to wear a compression wraps and cuts them off because she says she cannot tolerate the pain. She does not use her juxta lites either. 10/22/2016 -- he has rescheduled her arterial duplex study to May 21 and her pain today is a bit better. She has not been wearing her juxta lites on her right lower extremity but now understands that she needs to do this. She did tolerate the to press compression wrap on her left lower extremity 10/29/2016 --arterial duplex study is scheduled for next week and overall she has been tolerating her compression wraps and also using her juxta lites on her right lower extremity 11/05/2016 -- the right ABI was 0.95 the left was 1.03. The digit TBI is on the right was 0.83 on the left was 0.92 and she had biphasic flow through these vessels. The impression was that of normal lower extremity arterial study. 11/12/2016 -- her pain is minimal and she  is doing very well overall. 11/26/2016 -- she has got juxta lites and her insurance will not pay for additional dual layer compression stockings. She is going to order some from [place]. 05/12/2017 -- her juxta lites are very old and too big for her and these have not been helping with compression. She did get 20-30 mm compression stockings from [place] but she and her husband are unable to put these on. I believe she will benefit from bilateral Extremit-ease, compression stockings and we will measure her for these today. 05/20/2017 -- lymphedema on the left lower extremity has increased a lot and she has a open ulceration as a result of this. The right lower extremity is looking pretty good. She has decided to by the compression stockings herself and will get reimbursed by the home health, at a later date. 05/27/2017 -- her sciatica is bothering her a lot and she thought her left leg pain was caused due to the compression wrap and hence removed it and has significant lymphedema. There is no inflammation on this left lower extremity. 06/17/17 on evaluation today patient appears to be doing very well and in fact is completely healed in regard to her ulcerations. Unfortunately however she does have continued issues with lymphedema nonetheless. We did order compression garments for her unfortunately she states that the size that she received were large although we ordered medium. Obviously this means she is not getting the optimal compression. She does not have those with her today and therefore  we could not confirm and contact the company on her behalf. Nonetheless she does state that she is going to have her husband bring them by tomorrow so that we can verify and then get in touch with the company. No fevers, chills, nausea, or vomiting noted at this time. Overall patient is doing better otherwise and I'm pleased with the progress she has made. 07/01/17 on evaluation today patient appears to be doing  very well in regard to her bilateral lower extremity she does not have any openings at this point which is excellent news. Overall I'm pleased with how things have progressed up to this time. Since she is doing so well we did order her compression which we are seeing her today to ensure that it fits her properly and everything is doing well in that regard and then subsequently she will be discharged. ============ Old Notes: 03/31/16 patient presents today for evaluation concerning open wounds that she has over the left medial ankle region as well as the left dorsal foot. She has previously had this occur although it has been healed for a number of months after having this for about a year prior until her hospitalization on 01/05/16. At that point in time it appears that she was admitted to the hospital for left lower extremity cellulitis and was placed on vancomycin and Zosyn at that point. Eventually upon discharge on January 15, 2016 she was placed on doxycycline at that point in time. Later on 03/27/16 positive wound culture growing Escherichia coli this was switched to amoxicillin. Currently she tells me that she is having pain radiated to be a 7 out of 10 which can be as high as 10 out of 10 with palpation and manipulation of the wound. This wound appears to be mainly venous in nature due to the bilateral lower extremity venous stasis/lymphedema. This is definitely much worse on her left than the right side. She does have type 1 diabetes mellitus, hypertension, morbid obesity, and is wheelchair dependent.during the course of the hospital stay a blood culture was also obtained and fortunately appeared negative. She also had an x-ray of the tibia/fibula on the left which showed no acute bone abnormality. Her white blood cell count which was performed last on 03/25/16 was 7.3, hemoglobin 12.8, protein 7.1, albumin 3.0. Her urine culture appeared to be negative for any specific organisms. Patient did  have a left lower extremity venous duplex evaluation for DVT . This did not include venous reflux studies but fortunately was negative for DVT Patient also had arterial studies performed which revealed that she had a . normal ABI on the right though this was unable to be performed on the left secondary to pain that she was having around the ankle region due to the wound. However it was stated on report that she had biphasic pulses and apparently good blood flow. ========== 06/03/17 she is here in follow-up evaluation for right lower extremity ulcer. The right lower sure he has healed but she has reopened to the left medial malleolus and dorsal foot with weeping. She is waiting for new compression garments to arrive from home health, the previous compression garments were ill fitting. We will continue with compression bilaterally and follow-up in 2 weeks Readmission: 08/05/17 on evaluation today patient appears to be doing somewhat poorly in regard to her left lower extremity especially although the right lower extremity has a small area which may no longer be open. She has been having a lot of drainage from the  left lower extremity however he tells me that she has not been able to use the EXTREMIT-EASE Compression at this point. She states that she did better and was able to actually apply the Juxta-Lite compression although the wound that she has is too large and therefore really does not compress which is why she cannot wear it at this point. She has no one who can help her put it on regular basis her son can sometimes but he's not able to do it most of the time. I do believe that's why she has begun to weave and have issues as she is currently yet again. No fevers, chills, nausea, or vomiting noted at this time. Patient is no evidence of dementia. 08/12/17 on evaluation today patient appears to still be doing fairly well in regard to the draining areas/weeping areas at this point. With that being  said she unfortunately did go to the ER yesterday due to what was felt to be possibly a cellulitis. They place her on doxycycline by mouth and discharge her home. She definitely was not admitted. With that being said she states she has had more discomfort which has been unusual for her even compared to prior times and she's had infections.08/12/17 on evaluation today patient appears to still be doing fairly well in regard to the draining areas/weeping areas at this point. With that being said she unfortunately did go to the ER yesterday due to what was felt to be possibly a cellulitis. They place her on doxycycline by mouth and discharge her home. She definitely was not admitted. With that being said she states she has had more discomfort which has been unusual for her even compared to prior times and she's had infections. 08/19/17 put evaluation today patient tells me that she's been having a lot of what sounds to be neuropathic type pain in regard to her left lower extremity. She has been using over-the-counter topical bins again which some believe. That in order to apply the she actually remove the wrap we put on her last Wednesday on Thursday. Subsequently she has not had anything on compression wise since that time. The good news is a lot of the weeping areas appear to have closed at this point again I believe she would do better with compression but we are struggling to get her to actually use what she needs to at this point. No fevers, chills, nausea, or vomiting noted at this time. 09/03/17 on evaluation today patient appears to be doing okay in regard to her lower extremities in regard to the lymphedema and weeping. Fortunately she does not seem to show any signs of infection at this point she does have a little bit of weeping occurring in the right medial malleolus area. With that being said this does not appear to be too significant which is good news. 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 She has severe skin damage secondary to both of these features involving the dorsal left foot and medial left ankle and lower leg. Still has open areas in the left anterior foot. The area on the right closed over. She uses her own juxta light stockings. She does not have an arterial issue 09/16/17 on evaluation today patient actually appears to be doing excellent in regard to her bilateral lower extremity swelling. The Juxta-Lite compression wrap seem to be doing very well for her. She has not however been using the portion that goes over her foot.  Her left foot still is draining a little bit not nearly as significant as it has been in the past but still I do believe that she likely needs to utilize the full wrap including the foot portion of this will improve as well. She also has been apparently putting on a significant amount of Vaseline which also think is not helpful for her. I recommended that if she feels she needs something for moisturizer Eucerin will probably be better. 09/30/17 on evaluation today patient presents with several new open areas in regard to her left lower extremity although these appear to be minimal and mainly seem to be more moisture breakdown than anything. Fortunately she does not seem to have any evidence of infection which is great news. She has been tolerating the dressing changes without complication we are using silver alginate on the foot she has been using AB pads to have the legs and using her Juxta- Lite compression which seems to be controlling her swelling very well. Overall I'm pleased with the poor way she has progressed. 10/14/17 on evaluation today patient appears to be doing better in regard to her left lower extremity areas of weeping. She does still have some discomfort although in general this does not appear to be as macerated and I think it is progressing nicely. I do think she still  needs to wear the foot portion of her Juxta- Lite in order to get the most benefit from the wrap obviously. She states she understands. Fortunately there does not appear to be evidence of infection at this time which is great news. 10/28/17 on evaluation today patient appears to be doing excellent in regard to her left lower extremity. She has just a couple areas that are still open and seem to be causing any trouble whatsoever. For that reason I think that she is definitely headed in the right direction the spots are very tiny compared to what we have been dealing with in the past. 11/11/17 on evaluation today patient appears to have a right lateral lower extremity ulcer that has opened since I last saw her. She states this is where the home health nurse that was coming out remove the dressing without wetting the alginate first. Nonetheless I do not know if this is indeed the case or not but more importantly we have not ordered home help to be coming out for her wounds at all. I'm unsure as to why they are coming out and we're gonna have to check on this and get things situated in that regard. With that being said we currently really do not need them to be coming out as the patient has been taking care of her leg herself without complication and no issues. In fact she was doing much better prior to nursing coming out. 11/25/17 on evaluation today patient actually appears to be doing fairly well in regard to her left lower extremity swelling. In fact she has very little area of weeping at this point there's just a small spot on the lateral portion of her right leg that still has me just a little bit more concerned as far as wanting to see this clear up before I discharge her to caring for this at home. Nonetheless overall she has made excellent progress. 12/09/17 on evaluation today patient appears to be doing rather well in regard to her lower extremity edema. She does have some weeping still in the left  lower extremity although the big area we were taking care of two  weeks ago actually has closed and she has another area of weeping on the left lower extremity immediately as well is the top of her foot. She does not currently have lymphedema pumps she has been wearing her compression daily on a regular basis as directed. With that being said I think she may benefit from lymphedema pumps. 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 We had a very difficult time getting and keeping this under control. 12/23/17 on evaluation today patient actually appears to be doing a little bit more poorly in regard to her bilateral lower extremities. She has been tolerating the Juxta-Lite compression wraps. Unfortunately she has two new ulcers on the right lower extremity and left lower Trinity ulceration seems to be larger. Obviously this is not good news. She has been tolerating the dressings without complication. 12/30/17 on evaluation today patient actually appears to be doing much better in regard to her bilateral lower extremity edema. She continues to have some issues with ulcerations and in fact there appears to be one spot on each leg where the wrap may have caused a little bit of a blister which is subsequently opened up at this point is given her pain. Fortunately it does not appear to be any evidence of infection which is good news. No fevers chills noted. 01/13/18 on evaluation today patient appears to be doing rather well in regard to her bilateral lower extremities. The dressings did get kind of stuck as far as the wound beds are concerned but again I think this is mainly due to the fact that she actually seems to be showing signs of healing which is good news. She's not having as much drainage therefore she was having more of the dressing sticking.  Nonetheless overall I feel like her swelling is dramatically down compared to previous. 01/20/18 on evaluation today patient unfortunately though she's doing better in most regards has a large blister on the left anterior lower extremity where she is draining quite significantly. Subsequently this is going to need debridement today in order to see what's underneath and ensure she does not continue to trapping fluid at this location. Nonetheless No fevers, chills, nausea, or vomiting noted at this time. 01/27/18 on evaluation today patient appears to be doing rather well at this point in regard to her right lower extremity there's just a very small area that she still has open at this point. With that being said I do believe that she is tolerating the compression wraps very well in making good progress. Home health is coming out at this point to see her. Her left lower extremity on the lateral portion is actually what still mainly open and causing her some discomfort for the most part 02/10/18 on evaluation today patient actually appears to be doing very well in regard to her right lower extremity were all the ulcers appear to be completely close. In regard to the left lower extremity she does have two areas still open and some leaking from the dorsal surface of her foot but this still seems to be doing much better to me in general. 02/24/18 on evaluation today patient actually appears to be doing much better in regard to her right lower extremity this is still completely healed. Her left lower extremity is also doing much better fortunately she has no evidence of infection. The one  area that is gonna require some debridement is still on the left anterior shin. Fortunately this is not hurting her as badly today. 03/10/18 on evaluation today patient appears to be doing better in some regards although she has a little bit more open area on the dorsal foot and she also has some issues on the medial portion of  the left lower extremity which is actually new and somewhat deep. With that being said there fortunately does not appear to be any significant signs of infection which is good news. No fevers, chills, nausea, or vomiting noted at this time. In general her swelling seems to be doing fairly well which is good news. 03/31/18 on evaluation today patient presents for follow-up concerning her left lower extremity lymphedema. Unfortunately she has been doing a little bit more poorly since I last saw her in regard to the amount of weeping that she is experiencing. She's also having some increased pain in the anterior shin location. Unfortunately I do not feel like the patient is making such good progress at this point a few weeks back she was definitely doing much better. 04/07/18 on evaluation today patient actually appears to be showing some signs of improvement as far as the left lower extremity is concerned. She has been tolerating the dressing changes and it does appear that the Drawtex did better for her. With that being said unfortunately home health is stating that they cannot obtain the Drawtex going forward. Nonetheless we're gonna have to check and see what they may be able to get the alginate they were using was getting stuck in causing new areas of skin being pulled all that with and subsequently weep and calls her to worsen overall this is the first time we've seen improvement at this time. 04/14/18 on evaluation today patient actually appears to be doing rather well at this point there does not appear to be any evidence of infection at this time and she is actually doing excellent in regard to the weeping in fact she almost has no openings remaining even compared to just last week this is a dramatic improvement. No fevers chills noted 04/21/18 evaluation today patient actually appears to be doing very well. She in fact is has a small area on the posterior lower extremity location and she has  a small area on the dorsal surface of her foot that are still open both of which are very close to closing. We're hoping this will be close shortly. She brought her Juxta-Lite wrap with her today hoping that would be able to put her in it unfortunately I don't think were quite at that point yet but we're getting closer. 04/28/18 upon evaluation today patient actually appears to be doing excellent in regard to her left lower extremity ulcer. In fact the region on the posterior lower extremity actually is much smaller than previously noted. Overall I'm very happy with the progress she has made. She again did bring her Juxta-Lite although we're not quite ready for that yet. 05/11/18 upon evaluation today patient actually appears to be doing in general fairly well in regard to her left lower Trinity. The swelling is very well controlled. With that being said she has a new area on the left anterior lower extremity as well as between the first and second toes of her left foot that was not present during the last evaluation. The region of her posterior left lower extremity actually appears to be almost completely healed. T be honest I'm very pleased  with o the way that stands. Nonetheless I do believe that the lotion may be keeping the area to moist as far as her legs are concerned subsequently I'm gonna consider discontinuing that today. 05/26/18 on evaluation today patient appears to be doing rather well in regard to her left lower should be ulcers. In fact everything appears to be close except for a very small area on the left posterior lower extremity. Fortunately there does not appear to be any evidence of infection at this time. Overall very pleased with her progress. 06/02/18 and evaluation today patient actually appears to be doing very well in regard to her lower extremity ulcers. She has one small area that still continues to weep that I think may benefit her being able to justify lotion and user  Juxta-Lite wraps versus continued to wrap her. Nonetheless I think this is something we can definitely look into at this point. 06/23/18 on evaluation today patient unfortunately has openings of her bilateral lower extremities. In general she seems to be doing much worse than when I last saw her just as far as her overall health standpoint is concerned. She states that her discomfort is mainly due to neuropathy she's not having any other issues otherwise. No fevers, chills, nausea, or vomiting noted at this time. 06/30/18 on evaluation today patient actually appears to be doing a little worse in regard to her right lower extremity her left lower extremity of doing fairly well. Fortunately there is no sign of infection at this time. She has been tolerating the dressing changes without complication. Home health did not come out like they were supposed to for the appropriate wrap changes. They stated that they never received the orders from [place] which were fax. Nonetheless we will send a copy of the orders with the patient today as well. 07/07/18 on evaluation today patient appears to be doing much better in regard to lower extremities. She still has several openings bilaterally although since I last saw her her legs did show obvious signs of infection when she later saw her nurse. Subsequently a culture was obtained and she is been placed on Bactrim and Keflex. Fortunately things seem to be looking much better it does appear she likely had an infection. Again last week we'd even discussed it but again there really was not any obvious sign that she had infection therefore we held off on the antibiotics. Nonetheless I'm glad she's doing better today. 07/14/18 on evaluation today patient appears to be doing much better regarding her bilateral lower Trinity's. In fact on the right lower for me there's nothing open at this point there are some dry skin areas at the sites where she had infection. Fortunately there is  no evidence of systemic infection which is excellent news. No fevers chills noted 07/21/18 on evaluation today patient actually appears to be doing much better in regard to her left lower extremity ulcers. She is making good progress and overall I feel like she's improving each time I see her. She's having no pain I do feel like the infection is completely resolved which is excellent news. No fevers, chills, nausea, or vomiting noted at this time. 07/28/18 on evaluation today patient appears to be doing very well in regard to her left lower [employer]. Everything seems to be showing signs of improvement which is excellent news. Overall very pleased with the progress that has been made. Fortunately there's no evidence of active infection at this time also excellent news. 08/04/18 on evaluation  today patient appears to be doing more poorly in regard to her bilateral lower extremities. She has two new areas open up on the right and these were completely closed as of last week. She still has the two spots on the left which in my pinion seem to be doing better. Fortunately there's no evidence of infection again at this point. 08/11/18 on evaluation today patient actually appears to be doing very well in regard to her bilateral lower Trinity wounds that all seem to be doing better and are measures smaller today. Fortunately there's no signs of infection. No fevers, chills, nausea, or vomiting noted at this time. 08/18/18 on evaluation today patient actually appears to be doing about the same inverter bilateral lower extremities. She continues to have areas that blistering open as was drain that fortunately nothing too significant. Overall I feel like Drawtex may have done better for her however compared to the collagen. 08/25/18 on evaluation today patient appears to be doing a little bit more poorly today even compared to last time I saw her. Again I'm not exactly sure why she's making worse progress over  the past several weeks. I'm beginning to wonder if there is some kind of underlying low level infection causing this issue. I did actually take a culture from the left anterior lower extremity but it was a new wound draining quite a bit at this point. Unfortunately she also seems to be having more pain which is what also makes me worried about the possibility of infection. This is despite never erythema noted at this point. 09/01/18 on evaluation today patient actually appears to be doing a little worse even compared to last week in regard to bilateral lower extremities. She did go to the hospital on the 16th was given a dose of IV Zosyn and then discharged with a recommendation to continue with the Bactrim that I previously prescribed for her. Nonetheless she is still having a lot of discomfort she tells me as well at this time. This is definitely unfortunate. No fevers, chills, nausea, or vomiting noted at this time. 09/08/18 on evaluation today patient's bilateral lower extremities actually appear to be shown signs of improvement which is good news. Fortunately there does not appear to be any signs of active infection I think the anabiotic is helping in this regard. Overall I'm very pleased with how she is progressing. 09/15/18 patient was actually seen in ER yesterday due to her legs as well unfortunately. She states she's been having a lot of pain and discomfort as well as a lot of drainage. Upon inspection today the patient does have a lot of swelling and drainage I feel like this is more related to lymphedema and poor fluid control than it is to infection based on what I'm seeing. The physician in the emergency department also doubted that the patient was having a significant infection nonetheless I see no evidence of infection obvious at this point although I do see evidence of poor fluid control. She still not using a compression pumps, she is not elevating due to her lift chair as well as her  hospital bed being broken, and she really is not keeping her legs up as much as they should be and also has been taking off her wraps. All this combined I think has led to poor fluid control and to be honest she may be somewhat volume overloaded in general as well. I recommend that she may need to contact your physician to see if a  prescription for a diuretic would be beneficial in their opinion. As long as this is safe I think it would likely help her. 09/29/18 on evaluation today patient's left lower extremity actually appears to be doing quite a bit better. At least compared to last time that I saw her. She still has a large area where she is draining from but there's a lot of new skin speckled trout and in fact there's more new skin that there are open areas of weeping and drainage at this point. This is good news. With regard to the right lower extremity this is doing much better with the only open area that I really see being a dry spot on the right lateral ankle currently. Fortunately there's no signs of active infection at this time which is good news. No fevers, chills, nausea, or vomiting noted at this time. The patient seems somewhat stressed and overwhelmed during the visit today she was very lethargic as such. She does and she is not taking any pain medications at this point. Apparently according to her husband are also in the process of moving which is probably taking its toll on her as well. 10/06/18 on evaluation today patient appears to be doing rather well in regard to her lower extremities compared to last evaluation. Fortunately there's no signs of active infection. She tells me she did have an appointment with her primary. Nonetheless he was concerned that the wounds were somewhat deep based on pictures but we never actually saw her legs. She states that he had her somewhat worried due to the fact that she was fearing now that she was [place] have to have an amputation. With that being  said based on what I'm seeing check she looks better this week that she has the last two times I've seen her with much less drainage I'm actually pleased in this regard. That doesn't mean that she's out of the water but again I do not think what the point of talking about education at all in regard to her leg. She is very happy to hear this. She is also not having as much pain as she was having last week. 10/13/18 unfortunately on evaluation today patient still continues to have a significant amount of drainage she's not letting home health actually apply the compression dressings at this point. She's trying to use of Juxta-Lite of the top of Kerlex and the second layer of the three layer compression wrap. With that being said she just does not seem to be making as good a progress as I would expect if she was having the compression applied and in place on a regular basis. No fevers, chills, nausea, or vomiting noted at this time. 10/20/18 on evaluation today patient appears to be doing a little better in regard to her bilateral lower extremity ulcers. In fact the right lower extremity seems to be healed she doesn't even have any openings at this point left lower extremity though still somewhat macerated seems to be showing signs of new skin growth at multiple locations throughout. Fortunately there's no evidence of active infection at this time. No fevers, chills, nausea, or vomiting noted at this time. 10/27/18 on evaluation today patient appears to be doing much better in regard to her left lower Trinity ulcer. She's been tolerating the laptop complication and has minimal drainage noted at this point. Fortunately there's no signs of active infection at this time. No fevers, chills, nausea, or vomiting noted at this time. 11/03/18 on evaluation today  patient actually appears to be doing excellent in regard to her left lower extremity. She is having very little drainage at this point there does not appear  to be any significant signs of infection overall very pleased with how things have gone. She is likewise extremely pleased still and seems to be making wonderful progress week to week. I do believe antibiotics were helpful for her. Her primary care provider did place on amateur clean since I last saw her. 11/17/18 on evaluation today patient appears to be doing worse in regard to her bilateral lower extremities at this point. She is been tolerating the dressing changes without complication. With that being said she typically takes the Coban off fairly quickly upon arriving home even after being seen here in the clinic and does not allow home health reapply command as part of the dressing at home. Therefore she said no compression essentially since I last saw her as best I can tell. With that being said I think it shows and how much swelling she has in the open wounds that are noted at this point. Fortunately there's no signs of infection but unfortunately if she doesn't get this under control I think she will end up with infection and more significant issues. 11/24/18 on evaluation today patient actually appears to be doing somewhat better in regard to her bilateral lower extremities. 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 Nonetheless that is doing better currently so she should be able to attempt reinitiating the lymphedema pumps at this time. No fevers, chills, nausea, or vomiting noted at this time. 12/01/18 upon evaluation today patient's left lower extremity appears to be doing quite well unfortunately her right lower extremity is not doing nearly as well. She has been tolerating the dressing changes without complication unfortunately she did not keep a wrap on the right at this time. Nonetheless I believe this has led to increased swelling and weeping in  the world is actually much larger than during the last evaluation with her. 12/08/18 on evaluation today patient appears to be doing about the same at this point in regard to her right lower extremity. There is some more palatable to touch I'm concerned about the possibility of there being some infection although I think the main issue is she's not keeping her compression wrap on which in turn is not allowing this area to heal appropriately. 12/22/18 on evaluation today patient appears to be doing better in regard to left lower extremity unfortunately significantly worse in regard to the right lower extremity. The areas of blistering and necrotic superficial tissue have spread and again this does not really appear to be signs of infection and all she just doesn't seem to be doing nearly as well is what she has been in the past. Overall I feel like the Augmentin did absolutely nothing for her she doesn't seem to have any infection again I really didn't think so last time either is more of a potential preventative measure and hoping that this would make some difference but I think the main issue is she's not wearing her compression. She tells me she cannot wear the [place] been we put on she takes it off pretty much upon getting home. Subsequently she worshiped Juxta-Lite when I questioned her about how often she wears it this is no  more than three hours a day obviously that leaves 21 hours that she has no compression and this is obviously not doing well for her. Overall I'm concerned that if things continue to worsen she is at great risk of both infection as well as losing her leg. 01/05/19 on evaluation today patient appears to be doing well in regard to her left lower extremity which he is allowing [place] to wrap and not so well with regard to her right lower extremity which she is not allowing [place] to really wrap and keep the wrap on. She states that it hurts too badly whenever it's wrapped and she ends up  having to take it off. She's been using the Juxta-Lite she tells me up to six hours a day although I question whether or not that's really been the case to be honest. Previously she told me three hours today nonetheless obviously the legs as long as the wrap is doing great when she is not is doing much more poorly. 01/12/2019 on evaluation today patient actually appears to be doing a little better in my opinion with regard to her right lower extremity ulcer. She has a small open area on the left lower extremity unfortunately but again this I think is part of the normal fluctuation of what she is going to have to expect with regard to her legs especially when she is not using her lymphedema pumps on a regular basis. Subsequently based on what I am seeing today I think that she does seem to be doing slightly better with regard to her right lower extremity she did see her primary care provider on Monday they felt she had an infection and placed her on 2 antibiotics. Both Cipro and clindamycin. Subsequently again she seems possibly to be doing a little bit better in regards to the right lower extremity she also tells me however she has been wearing the compression wrap over the past week since I spoke with her as well that is a Kerlix and Coban wrap on the right. No fevers, chills, nausea, vomiting, or diarrhea. 01/19/2019 on evaluation today patient appears to be doing better with regard to her bilateral lower extremities especially the right. I feel like the compression has been beneficial for her which is great news. She did get a call from her primary care provider on her way here today telling her that she did have methicillin- resistant Staphylococcus aureus and he was calling in a couple new antibiotics for her including a ointment to be applied she tells me 3 times a day. With that being said this sounds like likely to be Bactroban which I think could be applied with each dressing/wrap change but I  would not be able to accommodate her applying this 3 times a day. She is in agreement with the least doing this we will add that to her orders today. 01/26/2019 on evaluation today patient actually appears to be doing much better with regard to her right lower extremity. Her left lower extremity is also doing quite well all things considering. Fortunately there is no evidence of active infection at this time. No fevers, chills, nausea, vomiting, or diarrhea. 02/02/2019 on evaluation today patient appears to be doing much better compared to her last evaluation. Little by little off like her right leg is returning more towards normal. There does not appear to be any signs of active infection and overall she seems to be doing quite well which is great news. I am very pleased  in this regard. No fevers, chills, nausea, vomiting, or diarrhea. 02/09/2019 upon evaluation today patient appears to be doing better with regard to her bilateral lower extremities. She has been tolerating the dressing changes without complication. Fortunately there is no signs of active infection at this time. No fevers, chills, nausea, vomiting, or diarrhea. 02/23/2019 on evaluation today patient actually appears to be doing quite well with regard to her bilateral lower extremities. She has been tolerating the dressing changes without complication. She is even used her pumps one time and states that she really felt like it felt good. With that being said she seems to be in good spirits and her legs appear to be doing excellent. 03/09/2019 on evaluation today patient appears to be doing well with regard to her right lower extremity there are no open wounds at this time she is having some discomfort but I feel like this is more neuropathy than anything. With regard to her left lower extremity she had several areas scattered around that she does have some weeping and drainage from but again overall she does not appear to be having any  significant issues and no evidence of infection at this time which is good news. 03/23/2019 on evaluation today patient appears to be doing well with regard to her right lower extremity which she tells me is still close she is using her juxta light here. Her left lower extremity she mainly just has an area on the foot which is still slightly draining although this also is doing great. Overall very pleased at this time. 04/06/2019 patient appears to be doing a little bit worse in regard to her left lower extremity upon evaluation today. She feels like this could be becoming infected again which she had issues with previous. Fortunately there is no signs of systemic infection but again this is always a struggle with her with her legs she will go from doing well to not so well in a very short amount of time. 04/20/2019 on evaluation today patient actually appears to be doing quite well with regard to her right lower extremity I do not see any signs of active infection at this time. Fortunately there is no fever chills noted. She is still taking the antibiotics which I prescribed for her at this point. In regard to the left lower extremity I do feel like some of these areas are better although again she still is having weeping from several locations at this time. 04/27/2019 on evaluation today patient appears to be doing about the same if not slightly worse in regard to her left lower extremity ulcers. She tells me when questioned that she has been sleeping in her Hoveround chair in fact she tells me she falls asleep without even knowing it. I think she is spending a whole lot of time in the chair and less time walking and moving around which is not good for her legs either. On top of that she is in a seated position which is also the worst position she is not really elevating her legs and she is also not using her lymphedema pumps. All this is good to contribute to worsening of her condition in  general. 05/18/2019 on evaluation today patient appears to be doing well with regard to her lower extremity on the right in fact this is showing no signs of any open wounds at this time. On the left she is continuing to have issues with areas that do drain. Some of the regions have healed and  there are couple areas that have reopened. She did go to the ER per the patient according to recommendations from the home health nurse due to what she was seen when she came out on 05/13/2019. Subsequently she felt like the patient needed to go to the hospital due to the fact that again she was having "milky white discharge" from her leg. Nonetheless she had and then was placed on doxycycline and subsequently seems to be doing better. 06/01/2019 upon evaluation today patient appears to be doing really in my opinion about the same. I do not see any signs of active infection which is good news. Overall she still has wounds over the bilateral lower extremities she has reopened on the right but this appears to be more of a crack where there is weeping/edema coming from the region. I do not see any evidence of infection at either site based on what I visualized today. 07/13/2019 upon evaluation today patient appears to be doing a little worse compared to last time I saw her. She since has been in the hospital from 06/21/2019 through 06/29/2019. This was secondary to having Covid. During that time they did apply lotion to her legs which unfortunately has caused her to develop a myriad of open wounds on her lower extremities. Her legs do appear to be doing better as far as the overall appearance is concerned but nonetheless she does have more open and weeping areas. 07/27/2019 upon evaluation today patient appears to be doing more poorly to be honest in regard to her left lower extremity in particular. There is no signs of systemic infection although I do believe she may have local infection. She notes she has been having  a lot of blue/green drainage which is consistent potentially with Pseudomonas. That may be something that we need to consider here as well. The doxycycline does not seem to have been helping. 08/03/2019 upon evaluation today patient appears to be doing a little better in my opinion compared to last week's evaluation. Her culture I did review today and she is on appropriate medications to help treat the Enterobacter that was noted. Overall I feel like that is good news. With that being said she is unfortunately continuing to have a lot of drainage and though it is doing better I still think she has a long ways to go to get things dried up in general. Fortunately there is no signs of systemic infection. 08/10/2019 upon evaluation today patient appears to be doing may be slightly better in regard to her left lower extremity the right lower extremity is doing much better. Fortunately there is no signs of infection right now which is good news. No fevers, chills, nausea, vomiting, or diarrhea. 08/24/2019 on evaluation today patient appears to be doing slightly better in regard to her lower extremities. The left lower extremity seems to be healed the right lower extremity is doing better though not completely healed as far as the openings are concerned. She has some generalized issues here with edema and weeping secondary to her lymphedema though again I do believe this is little bit drier compared to prior weeks evaluations. In general I am very pleased with how things seem to be progressing. No fevers, chills, nausea, vomiting, or diarrhea. 08/31/2019 upon evaluation today patient actually seems to making some progress here with regard to the left lower extremity in particular. She has been tolerating the dressing changes without complication. Fortunately there is no signs of active infection at this time. No fevers,  chills, nausea, vomiting, or diarrhea. She did see Dr. Doren Custard and he did note that she did have  a issue with the left great saphenous vein and the small saphenous vein in the leg. With that being said he was concerned about the possibility of laser ablation not being extremely successful. He also mentioned a small risk of DVT associated with the procedure. However if the wounds do not continue to improve he stated that that would probably be the way to go. Fortunately the patient's legs do seem to be doing much better. 09/07/2019 upon evaluation today patient appears to be doing better with regard to her lower extremities. She has been tolerating the dressing changes without complication. With that being said she is showing signs of improvement and overall very pleased. There are some areas on her leg that I think we do need to debride we discussed this last week the patient is in agreement with doing that as long as it does not hurt too badly. 09/14/2019 upon evaluation today patient appears to be doing decently well with regard to her left lower extremity. She is not having near as much weeping as she has had in the past things seem to be drying up which is good news. There is no signs of active infection at this time. 09/21/19 upon evaluation today patient appears to be doing better in regard overall to her bilateral lower extremities. She again has less open than she did previous and each week I feel like this is getting better. Fortunately there is no signs of active infection at this time. No fevers, chills, nausea, vomiting, or diarrhea. 09/28/2019 upon evaluation today patient actually appears to be showing signs of improvement with regard to her left lower extremity. Unfortunately the right medial lower extremity around the ankle region has reopened to some degree but this appears to be minimal still which is good news. There is no signs of active infection at this time which is also good news. 10/12/2019 upon evaluation today patient appears to be doing okay with regard to her bilateral  lower extremities today. The right is a little bit worse then last evaluation 2 weeks ago. The left is actually doing a little better in my opinion. Overall there is no signs of active infection at this time that I see. Obviously that something we have to keep a close eye on she is very prone to this with the significant and multiple openings that she has over the bilateral lower extremities. 10/19/2019 upon evaluation today patient appears to be doing about the best that I have seen her in quite some time. She has been tolerating the dressing changes without complication. There does not appear to be any signs of active infection and overall I am extremely happy with the way her legs appeared. She is drying up quite nicely and overall is having less pain. 11/09/2019 upon evaluation today patient appears to be doing better in regard to her wounds. She seems to be drying up more and more each time I see her this is just taking a very long time. Fortunately there is no signs of active infection at this time. 11/23/2019 upon evaluation today patient actually appears to be doing excellent in regard to her lower extremities at this point compared to where she has been. Fortunately there is no signs of active infection at this time. She did go to the hospital last week for nausea and vomiting completely unrelated to her wounds. Fortunately she is doing better  she was given some Reglan and got better. She had associated abdominal pain but they never found out what was going on. 12/07/2019 upon evaluation today patient appears to be doing well for the most part in regard to her legs. She unfortunately has not been keeping the [place] portion of her wraps on therefore the compression has not really been sufficient for what it is supposed to be. Nonetheless she tells me that it just hurt too bad therefore she removed it. 12/21/2019 upon evaluation today patient actually appears to be doing quite well with regard to her  legs. I do feel like she has been making progress which is great news and overall there is no signs of active infection at this time. No fevers, chills, nausea, vomiting, or diarrhea. 01/04/2020 upon evaluation today patient presents for follow-up concerning her lower extremity edema bilaterally. She still has open wounds she has not been using her lymphedema pumps. She is also not been utilizing her compression wraps appropriately she tends to unwrap them, take them off, or states that they hurt. Obviously the reason they hurt is because her legs start to swell but the issue is if she would use her compression/lymphedema pumps regularly she would not swell and she would have the pain. Nonetheless she has not even picked them up once honestly over the past several months and may be even as much as in the past year based on my opinion and what have seen. She tells me today that after last week when I talked about this with her specifically actually that was 2 weeks ago that she "forgot". 8//21 on evaluation today patient appears to be doing a little better in regard to her legs bilaterally. Fortunately there is no signs of active infection at this time. She tells me that she used her lymphedema pumps all of one time over the past 2 weeks since I last saw her. She tells me that she has been too busy in order to continue to use these. 02/01/2020 on evaluation today patient appears to be doing some better in regard to her wounds in general in her legs. We felt the right was healed although is not completely it does appear to be doing better she tells me she has been using her lymphedema pumps that she has had this six times since I last saw her. Obviously the more she does that the better she would do my opinion 02/15/2020 upon evaluation today patient appears to be doing about the same in regard to her legs. She tells me that she is pumping I'm still not sure how much she does to be perfectly honest. However  even if she does a little bit here and there I guess that is better than nothing. Fortunately there is no sign of active infection at this time which is great news. No fevers, chills, nausea, vomiting, or diarrhea. 02/29/2020 on evaluation today patient actually appears to be doing quite well all things considered this week. She has been tolerating the dressing changes without complication. Fortunately there is no signs of active infection at this time. No fevers, chills, nausea, vomiting, or diarrhea. 03/14/2020 upon evaluation today patient appears to be doing really about the same in regard to her legs. There is no signs of improvement overall and she as noted from home health does not appear to be elevating her legs he can get into her lift chair. There is too much stuff piled up on it the patient tells me. She also  tells me she cannot really use her pumps effectively due to the fact that she cannot have any space to get them on. Finally she is also not really elevating her legs because she is not sleeping in her bed she is sleeping in her chair currently and again overall I think everything that she is done in combination has been exactly the wrong thing for what she needs for her legs. 04/04/2020 upon evaluation today patient appears to be doing well at this time with regard to her legs. She is actually been pumping, keeping her wraps on, and to be honest she seems to be doing dramatically better the right leg is excellent the left leg is also excellent and measuring much smaller than previous. 04/18/2020 upon evaluation today patient actually is continue to make good progress in regard to her lower extremities bilaterally. Everything is improving and less wet that has been in the past overall I am extremely pleased with where things stand and I think that she is making great progress. The patient tells me she still continue to use her compression pumps 05/02/2020 on evaluation today patient appears  to be doing well at this time in regard to her left leg which is showing signs of drying up. With that being said she does have a lot of lymphedema type crusty skin around the toes of her left foot and the right medial ankle which has opened at this point. Fortunately there is no signs of active infection systemically at this point or even locally for that matter. 05/23/2020 on evaluation today patient appears to be doing well with regard to her lower extremities. Fortunately there is no signs of active infection at this time. No fever chills noted. She has been very depressed however she tells me. 06/06/2020 patient came in today for evaluation in regard to her bilateral lower extremity ulcerations. With that being said she came in feeling okay and actually laughing and joking around with the staff checking her in. Subsequently however she had a coughing spell and following the coughing spell it was a dramatic conversion from being jovial and joking around to being extremely short of breath her vital signs actually dropped in regard to her blood pressure from around 175 to down around 433 for systolic and from around 96 diastolic down to around 70. With that being said she also accompanied this with an increase in her respiratory rate which was also quite significant. Nonetheless I actually upon going into see her was extremely worried we called EMS to have her transported to the ER for further evaluation and treatment. She continued until EMS got here to be extremely short of breath even on 6 L of oxygen. Her oxygen saturation did come up to 99% but overall it was only 95 before which was not terrible she did feel like the oxygen helped her feel somewhat better however. She has a history of asthma but again even the coughing spell really should not have done this degree of alteration in her demeanor from where she was just before to after the coughing spell. She tells [place] however she has been feeling  somewhat abnormal since Friday although she could not really pinpoint exactly what was going on. 06/27/2020 plan evaluation today patient appears to be doing okay in regard to her leg ulcers. She is really not showing a lot of improvement to be honest and she still has a lot of weeping. With that being said after I last saw her we called  EMS to take her to the hospital she actually did not go she went to see her primary care provider who according to the patient have not identified and read through the note states that everything checked out okay. Nonetheless the patient has continued to have bouts where she gets very short of breath for seemingly no reason whatsoever. That happened even the same night that I saw her last time. This was after she refused transport to the hospital. With that being said she also tells me that just getting dressed to come here is quite a chore which is putting on her shirt causing her to have to stop to catch her breath. Obviously this is not normal and I feel like there is something going on. She may need a referral to be seen by cardiology ASAP. She does see her primary care provider early next week she tells me 07/11/2020 upon evaluation today patient's wounds again appear to be doing about the same she mainly has weeping of the bilateral lower extremities both are very swollen today she tells me she has been using her pumps but last time I saw her she told me she did not even have her pumps out that she had "packed them away because they had too much stuff in their house. Apparently her husband has been bringing stuff in from storage units. She then subsequently told me that she had so much stuff in her house that she has been staying up all night and did not go to bed till 7:00 this morning because she was cleaning up stuff. Nonetheless I am still unsure as to whether or not she is using the pumps based on what I am seeing I would think probably not. Nonetheless she  has been keeping the compression wraps in place that is at least something 07/25/2020 upon evaluation today patient appears to be doing well currently in regard to her legs all things considered. I do not think she is doing any worse significantly although honestly I do not think she is doing a lot better either. There does not appear to be any signs of infection which is good news although she does have a lot of weeping. She is using her pumps she tells me sporadically though she says that her husband is not a big fan of helping her to get the [place]. She also tells me is difficult because in their house. It would be easiest for her to use these as where her son is actually staying with her and living out at this point. Obviously I understand that she is having a lot of issues here and to be honest I do not really know what to say in that regard and the fact that I really cannot change the situations but I do believe she really needs to be using the lymphedema pumps to try to keep things under control here. Outside of that I think she is going to continue to have significant issues. I think the pumps coupled with good compression and elevation are the only things that are to help her at this point. 08/08/2020 upon evaluation today patient appears to be doing a little worse in regard to her legs in general. I think this may be due to some infection currently. With that being said I am going to go ahead and likely see about putting her on an antibiotic. Were also can obtain a culture today where she had some purulent drainage. 08/29/2020 upon evaluation today patient appears to  be doing about the same in regard to her bilateral lower extremities. Unfortunately she is continuing to have significant issues here with edema and she has significant lymphedema. With that being said she is really not doing anything that she is supposed to be doing as far as elevation, compression, using lymphedema pumps or  anything really for that matter. She does not use her pumps, is taken the wraps off shortly after having them put on as far as home health is concerned at least by the next day she tells me, and overall is not really elevating her legs she is telling me she has a lot to do as far as cleaning up in her house. Nonetheless this triad of noncompliance is leading to worsening not improving in regard to her legs. I had a very strong conversation with her about this today again. 09/12/2020 on evaluation today patient appears to be doing poorly with regard to her lower extremities bilaterally. Fortunately there does not appear to be any signs of active infection at this point. That is systemically. Locally there is some signs of pus coming from an area on her left leg. She has been on antibiotics recently though she is done with those currently. Nonetheless she tells me she has been having increased pain with the right leg this is extremely swollen as well. Fortunately there does not appear to be significant infection of the right leg though I think her pain is really as result of the increased swelling she has been declining the wrap on the right leg and the wounds here are getting significantly worse week by week. This obviously is not will be want to see. I discussed with the patient however if she does not use the wraps, use her pumps, and elevate her legs that she is not to get better. She has been sleeping in her motorized Hoveround which does not even allow her to elevate her legs at all. She tells me that she is also constantly "cleaning up as her husband told her that he wants to move back to [place]." Unfortunately this has been the narrative for the past several months in fact that she tells me that she has been having to clean up mass that was moved in from storage units into their home and that she does not really sleep and she never really elevates her legs. I explained to the patient today  that if she is not can I do any of this that there is really nothing that I can do to help her. 09/26/20 upon evaluation today patient appears to be doing about the same in regard to her wounds. Fortunately there is no signs of active infection at this time. No fever chills noted. She has been tolerating the dressing changes without complication which is good news. With that being said I do think that she unfortunately is still having a lot of pain but I think this is due to the swelling that really is not controlled and as I discussed with her last time she needs to be using her lymphedema pumps there is still in the box she has not even attempted to use those whatsoever. I am also not even certain she is taking her fluid pills as she states she may need to have "get a refill" she was seen with her husband at the appointment today. 10/24/2020 upon evaluation today patient appears to be doing decently well in regard to her legs all things considered. She has been tolerating  the dressing changes without complication. Fortunately there is no signs of active infection at this time. No fevers, chills, nausea, vomiting, or diarrhea. With that being said the patient does appear to have some excessive thickened skin buildup currently that has been require some sharp debridement to clear this away. I am hopeful if we do this we will be able to get some of this area to dry out this otherwise trapping fluid underneath. Her son is present during the office visit today. She tells me she still been doing a lot as far as cleaning up and going through boxes she tells me she has been up for a long time already this morning. 5/25; patient presents for 2-week follow-up. She had 3 layer compression wrap placed with calcium alginate underneath. She reports tolerating the wraps well. She has home health that changes the wrap 2-3 times a week. She denies signs of infection. She has almost finished her course of antibiotics. She  uses her lymphedema pumps once weekly. 11/21/2020 upon evaluation today patient appears to be doing well with regard to her legs all things considered. Fortunately there is no signs of active infection at this time which is great news I do not see any evidence of infection and overall I think that she is definitely improved compared to where things did previous. Nonetheless I do think that she may benefit from removing some of the thicker skin on the legs. Again we have attempted this in the past to some degree but I think that we might be able to more effectively do this of a clear some way and then potentially use a urea cream with salicylic acid to try to help clear some of this away. The patient is in agreement with the plan. 12/05/2020 upon evaluation today patient appears to be doing well with regard to her wounds in general. I feel like that things are somewhat improved the skin looking a little better. With that being said she still has significant lymphedema. She is not been using her lymphedema pumps and not really been elevating her legs. She also did have some maggots noted on the left leg she tells me that the "gnats are terrible and get all over her foot." With that being said I am concerned about the fact that the patient still is quite swollen even with the compression wraps I really feel like she needs to be using her pumps but I cannot talk her into doing this. 12/19/2020 upon evaluation today patient appears to be doing about the same in regard to her legs. Fortunately I do not see any signs of infection unfortunately she really seems to be very nonchalant about the fact that she has not been using her lymphedema pumps. She tells me that "I just forget". With that being said this needs to be something that is a priority for her I cannot count the number of times that have had this discussion with her and yet nothing seems to change. With that being said I think that is very unlikely she  is ever really getting healed with regard to her wounds to be honest she does not seem to be motivated to do anything to try to help at this point despite everything that I have tried to do and recommend for her. 01/02/2021 upon evaluation today patient actually appears to be doing excellent in regard to her wounds. She has been tolerating the dressing changes without complication. Fortunately there does not appear to be any signs of  active infection at this time which is great news. No fevers, chills, nausea, vomiting, or diarrhea. 01/23/2021 upon evaluation today patient appears to be doing worse in general in regard to her wounds. This is due to the fact that she is not been using her compression pumps which again she knows when she does not it always turns out poorly for her. Unfortunately the issue here today is that she is continuing to have troubles with her son she seems to be very depressed and I am truly sorry for her and everything she is going through. With that being said I do not know what to do to make things better if she want elevate her legs and use her lymphedema pumps there is really not have anything I can do to correct or fix the situation for her to be honest. I discussed all this with her today. Electronic Signature(s) Signed: 01/24/2021 5:45:42 PM By: [name] PA-C Entered By: [name] on 01/24/2021 17:45:42 -------------------------------------------------------------------------------- Physical Exam Details Patient Name: Date of Service: ONDREA, DOW 01/23/2021 10:30 A M Medical Record Number: [id number] Patient Account Number: [id number] Date of Birth/Sex: Treating RN: 04-Mar-1951 (70 y.o. [name] Primary Care Provider: [name] Other Clinician: Referring Provider: Treating Provider/Extender: Darlen Round in Treatment: 181 Constitutional Chronically ill appearing but in no apparent acute  distress. Respiratory normal breathing without difficulty. Psychiatric this patient is able to make decisions and demonstrates good insight into disease process. Alert and Oriented x 3. pleasant and cooperative. Notes Upon inspection patient has much more weeping in general in regard to her legs and again this is something that I explained is going to continue to be an issue if she continues to not use her lymphedema pumps, elevate her legs, and keep the compression wraps in place. With that being said she is not elevating her legs infection sleeping in her motorized wheelchair. She also does not using her lymphedema pumps at all because she states that her son is "taken off the space". Overall I do feel sorry for her but I do not know what to do to make this better. Electronic Signature(s) Signed: 01/24/2021 5:46:24 PM By: [name] PA-C Entered By: [name] on 01/24/2021 17:46:24 -------------------------------------------------------------------------------- Physician Orders Details Patient Name: Date of Service: [name]. 01/23/2021 10:30 A M Medical Record Number: [id number] Patient Account Number: [id number] Date of Birth/Sex: Treating RN: June 13, 1951 (70 y.o. [name] Primary Care Provider: [name] Other Clinician: Referring Provider: Treating Provider/Extender: Darlen Round in Treatment: [phone] Verbal / Phone Orders: No Diagnosis Coding ICD-10 Coding Code Description E11.622 Type 2 diabetes mellitus with other skin ulcer I89.0 Lymphedema, not elsewhere classified I87.331 Chronic venous hypertension (idiopathic) with ulcer and inflammation of right lower extremity I87.332 Chronic venous hypertension (idiopathic) with ulcer and inflammation of left lower extremity L97.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 leg with fat layer exposed L97.822 Non-pressure chronic ulcer of other part of left lower leg with  fat layer 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 Bathing/ Shower/ Hygiene May shower and wash wound with soap and water. - with dressing changes, wash both legs with wash cloth and soap and water with dressing changes Other Bathing/Shower/Hygiene Orders/Instructions: - Home  health to wash legs with warn soapy water and a clean wash cloth with dressing changes Edema Control - Lymphedema / SCD / Other Bilateral Lower Extremities Lymphedema Pumps. Use Lymphedema pumps on leg(s) 2-3 times a day for 45-60 minutes. If wearing any wraps or hose, do not remove them. Continue exercising as instructed. Elevate legs to the level of the heart or above for 30 minutes daily and/or when sitting, a frequency of: - especially at night Avoid standing for long periods of time. Exercise regularly Home Health No change in wound care orders this week; continue Home Health for wound care. May utilize formulary equivalent dressing for wound treatment orders unless otherwise specified. - add urea cream to legs with dressing changes when available Dressing changes to be completed by [place] on Monday / Wednesday / Friday except when patient has scheduled visit at [facility]. Other Home Health Orders/Instructions: - [facility] Wound #61 - Lower Leg Wound Laterality: Left, Circumferential Peri-Wound Care: Triamcinolone 15 (g) 3 x Per Week/30 Days Discharge Instructions: Use triamcinolone 15 (g) thin layer on thick dry skin Peri-Wound Care: Sween Lotion (Moisturizing lotion) (Home Health) 3 x Per Week/30 Days Discharge Instructions: Apply moisturizing lotion to dry skin on legs Prim Dressing: Maxorb Extra Calcium Alginate 2x2 in (Home  Health) 3 x Per Week/30 Days ary Discharge Instructions: Apply calcium alginate to wound bed as instructed Secondary Dressing: ABD Pad, 8x10 (Home Health) 3 x Per Week/30 Days Discharge Instructions: Apply over primary dressing as directed. Secondary Dressing: Zetuvit Plus 4x4 in (Home Health) 3 x Per Week/30 Days Discharge Instructions: or equivalent extra absorbent pad.Apply over primary dressing as needed. Compression Wrap: ThreePress (3 layer compression wrap) (Home Health) 3 x Per Week/30 Days Discharge Instructions: Apply three layer compression as directed. Pad bend of ankle with foam or ABD pad. Wound #64 - Foot Wound Laterality: Dorsal, Left Peri-Wound Care: Triamcinolone 15 (g) 3 x Per Week/30 Days Discharge Instructions: Use triamcinolone 15 (g) thin layer on thick dry skin Peri-Wound Care: Sween Lotion (Moisturizing lotion) (Home Health) 3 x Per Week/30 Days Discharge Instructions: Apply moisturizing lotion to dry skin on legs Prim Dressing: Maxorb Extra Calcium Alginate 2x2 in (Home Health) 3 x Per Week/30 Days ary Discharge Instructions: Apply calcium alginate to wound bed as instructed Secondary Dressing: ABD Pad, 8x10 (Home Health) 3 x Per Week/30 Days Discharge Instructions: Apply over primary dressing as directed. Secondary Dressing: Zetuvit Plus 4x4 in (Home Health) 3 x Per Week/30 Days Discharge Instructions: or equivalent extra absorbent pad.Apply over primary dressing as needed. Compression Wrap: ThreePress (3 layer compression wrap) (Home Health) 3 x Per Week/30 Days Discharge Instructions: Apply three layer compression as directed. Pad bend of ankle with foam or ABD pad. Wound #67 - Lower Leg Wound Laterality: Right, Medial Peri-Wound Care: Triamcinolone 15 (g) 3 x Per Week/30 Days Discharge Instructions: Use triamcinolone 15 (g) thin layer on thick dry skin Peri-Wound Care: Sween Lotion (Moisturizing lotion) (Home Health) 3 x Per Week/30 Days Discharge  Instructions: Apply moisturizing lotion to dry skin on legs Prim Dressing: Maxorb Extra Calcium Alginate 2x2 in (Home Health) 3 x Per Week/30 Days ary Discharge Instructions: Apply calcium alginate to wound bed as instructed Secondary Dressing: ABD Pad, 8x10 (Home Health) 3 x Per Week/30 Days Discharge Instructions: Apply over primary dressing as directed. Secondary Dressing: Zetuvit Plus 4x4 in (Home Health) 3 x Per Week/30 Days Discharge Instructions: or equivalent extra absorbent pad.Apply over primary dressing as needed. Compression Wrap: ThreePress (  3 layer compression wrap) (Home Health) 3 x Per Week/30 Days Discharge Instructions: Apply three layer compression as directed. Pad bend of ankle with foam or ABD pad. Wound #71 - Lower Leg Wound Laterality: Right, Lateral Peri-Wound Care: Triamcinolone 15 (g) 3 x Per Week/30 Days Discharge Instructions: Use triamcinolone 15 (g) thin layer on thick dry skin Peri-Wound Care: Sween Lotion (Moisturizing lotion) (Home Health) 3 x Per Week/30 Days Discharge Instructions: Apply moisturizing lotion to dry skin on legs Prim Dressing: Maxorb Extra Calcium Alginate 2x2 in (Home Health) 3 x Per Week/30 Days ary Discharge Instructions: Apply calcium alginate to wound bed as instructed Secondary Dressing: ABD Pad, 8x10 (Home Health) 3 x Per Week/30 Days Discharge Instructions: Apply over primary dressing as directed. Secondary Dressing: Zetuvit Plus 4x4 in (Home Health) 3 x Per Week/30 Days Discharge Instructions: or equivalent extra absorbent pad.Apply over primary dressing as needed. Compression Wrap: ThreePress (3 layer compression wrap) (Home Health) 3 x Per Week/30 Days Discharge Instructions: Apply three layer compression as directed. Pad bend of ankle with foam or ABD pad. Electronic Signature(s) Signed: 01/23/2021 3:55:35 PM By: [name] RN, BSN Signed: 01/24/2021 5:47:57 PM By: [name] PA-C Entered By: [name] on  01/23/2021 11:40:43 -------------------------------------------------------------------------------- Problem List Details Patient Name: Date of Service: [name]. 01/23/2021 10:30 A M Medical Record Number: [id number] Patient Account Number: [id number] Date of Birth/Sex: Treating RN: 1950/09/09 (70 y.o. Martyn Malay, Vaughan Basta Primary Care Provider: [name] Other Clinician: Referring Provider: Treating Provider/Extender: Darlen Round in Treatment: [phone] Active Problems ICD-10 Encounter Code Description Active Date MDM Diagnosis E11.622 Type 2 diabetes mellitus with other skin ulcer 08/05/2017 No Yes I89.0 Lymphedema, not elsewhere classified 08/05/2017 No Yes I87.331 Chronic venous hypertension (idiopathic) with ulcer and inflammation of right 08/05/2017 No Yes lower extremity I87.332 Chronic venous hypertension (idiopathic) with ulcer and inflammation of left 08/05/2017 No Yes lower extremity L97.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 leg with fat layer 08/05/2017 No Yes exposed L97.822 Non-pressure chronic ulcer of other part of left lower leg with fat layer exposed2/20/2019 No Yes L97.522 Non-pressure chronic ulcer of other part of left foot with fat layer exposed 06/01/2019 No Yes I10 Essential (primary) hypertension 08/05/2017 No Yes E66.01 Morbid (severe) obesity due to excess calories 08/05/2017 No Yes F41.8 Other specified anxiety disorders 08/05/2017 No Yes R53.1 Weakness 08/05/2017 No Yes Inactive Problems Resolved Problems Electronic Signature(s) Signed: 01/23/2021 10:35:09 AM By: [name] PA-C Entered By: [name] on 01/23/2021 10:35:09 -------------------------------------------------------------------------------- Progress Note Details Patient Name: Date of Service: [name]. 01/23/2021 10:30 A M Medical Record Number: [id number] Patient Account Number: [id number] Date of Birth/Sex: Treating  RN: 04/25/1951 (70 y.o. [name] Primary Care Provider: [name] Other Clinician: Referring Provider: Treating Provider/Extender: Darlen Round in Treatment: 181 Subjective Chief Complaint Information obtained from Patient Bilateral reoccurring LE ulcers History of Present Illness (HPI) this patient has been seen a couple of times before and returns with recurrent problems to her right and left lower extremity with swelling and weeping ulcerations due to not wearing her compression stockings which she had been advised to do during her last discharge, at the end of June 2018. During her last visit the patient had had normal arterial blood flow and her venous reflux study did not necessitate any surgical intervention. She was recommended compression and elevation and wound care. After prolonged treatment the patient was completely healed but she  has been noncompliant with wearing or compressions.. She was here last week with an outpatient return visit planned but the patient came in a very poor general condition with altered mental status and was rushed to the ER on my request. With a history of hypertension, diabetes, TIA and right-sided weakness she was set up for an MRI on her brain and cervical spine and was sent to [facility]. Getting an MRI done was very difficult but once the workup was done she was found not to have any spinal stenosis, epidural abscess or hematoma or discitis. This was radiculopathy to be treated as an outpatient and she was given a follow-up appointment. Today she is feeling much better alert and oriented and has come to reevaluate her bilateral lower extremity lymphedema and ulceration 03/25/2017 -- she was admitted to the hospital on 03/16/2017 and discharged on 03/18/2017 with left leg cellulitis and ulceration. She was started on vancomycin and Zosyn and x-ray showed no bony involvement. She was treated for a cellulitis with IV  antibiotics changed to Rocephin and Flagyl and was discharged on oral Keflex and doxycycline to complete a 7 day course. Last hemoglobin A1c was 7.1 and her other ailments including hypertension got asthma were appropriately treated. 05/06/2017 -- she is awaiting the right size of compression stockings from [place] but other than that has been doing well. ====== Old notes 70 year old patient was seen one time last October and was lost to follow-up. She has recurrent problems with weeping and ulceration of her left lower extremity and has swelling of this for several years. It has been worse for the last 2 months. Past medical history is significant for diabetes mellitus type 2, hypertension, gout, morbid obesity, depressive disorders, hiatal hernia, migraines, status post knee surgery, risk of a cholecystectomy, vaginal hysterectomy and breast biopsy. She is not a smoker. As noted before she has never had a venous duplex study and an arterial ABI study was attempted but the left lower extremity was noncompressible 10/01/2016 -- had a lower extremity venous duplex reflux evaluation which showed no evidence of deep vein reflux in the right or left lower extremity, and no evidence of great saphenous vein reflux more than 500 ms in the right or left lower extremity, and the left small saphenous vein is incompetent but no vascular consult was recommended. review of her electronic medical records noted that the ABI was checked in July 2017 where the right ABI was normal limits and the left ABI could not be ascertained due to pain with cuff pressure but the waveforms are within normal limits. her arterial duplex study scheduled for April 27. 10/08/2016 -- the patient has various reasons for not having a compression on and for the last 3 days she has had no compression on her left lower extremity either due to pain or the lack of nursing help. She does not use her juxta lites either. 10/15/2016 -- the  patient did not keep her appointment for arterial duplex study on April 27 and I have asked her to reschedule this. Her pain is out of proportion with the physical findings and she continuously fails to wear a compression wraps and cuts them off because she says she cannot tolerate the pain. She does not use her juxta lites either. 10/22/2016 -- he has rescheduled her arterial duplex study to May 21 and her pain today is a bit better. She has not been wearing her juxta lites on her right lower extremity but now understands that she  needs to do this. She did tolerate the to press compression wrap on her left lower extremity 10/29/2016 --arterial duplex study is scheduled for next week and overall she has been tolerating her compression wraps and also using her juxta lites on her right lower extremity 11/05/2016 -- the right ABI was 0.95 the left was 1.03. The digit TBI is on the right was 0.83 on the left was 0.92 and she had biphasic flow through these vessels. The impression was that of normal lower extremity arterial study. 11/12/2016 -- her pain is minimal and she is doing very well overall. 11/26/2016 -- she has got juxta lites and her insurance will not pay for additional dual layer compression stockings. She is going to order some from [facility]. 05/12/2017 -- her juxta lites are very old and too big for her and these have not been helping with compression. She did get 20-30 mm compression stockings from [place] but she and her husband are unable to put these on. I believe she will benefit from bilateral Extremit-ease, compression stockings and we will measure her for these today. 05/20/2017 -- lymphedema on the left lower extremity has increased a lot and she has a open ulceration as a result of this. The right lower extremity is looking pretty good. She has decided to by the compression stockings herself and will get reimbursed by the home health, at a later date. 05/27/2017 -- her sciatica  is bothering her a lot and she thought her left leg pain was caused due to the compression wrap and hence removed it and has significant lymphedema. There is no inflammation on this left lower extremity. 06/17/17 on evaluation today patient appears to be doing very well and in fact is completely healed in regard to her ulcerations. Unfortunately however she does have continued issues with lymphedema nonetheless. We did order compression garments for her unfortunately she states that the size that she received were large although we ordered medium. Obviously this means she is not getting the optimal compression. She does not have those with her today and therefore we could not confirm and contact the company on her behalf. Nonetheless she does state that she is going to have her husband bring them by tomorrow so that we can verify and then get in touch with the company. No fevers, chills, nausea, or vomiting noted at this time. Overall patient is doing better otherwise and I'm pleased with the progress she has made. 07/01/17 on evaluation today patient appears to be doing very well in regard to her bilateral lower extremity she does not have any openings at this point which is excellent news. Overall I'm pleased with how things have progressed up to this time. Since she is doing so well we did order her compression which we are seeing her today to ensure that it fits her properly and everything is doing well in that regard and then subsequently she will be discharged. ============ Old Notes: 03/31/16 patient presents today for evaluation concerning open wounds that she has over the left medial ankle region as well as the left dorsal foot. She has previously had this occur although it has been healed for a number of months after having this for about a year prior until her hospitalization on 01/05/16. At that point in time it appears that she was admitted to the hospital for left lower extremity cellulitis  and was placed on vancomycin and Zosyn at that point. Eventually upon discharge on January 15, 2016 she was placed on  doxycycline at that point in time. Later on 03/27/16 positive wound culture growing Escherichia coli this was switched to amoxicillin. Currently she tells me that she is having pain radiated to be a 7 out of 10 which can be as high as 10 out of 10 with palpation and manipulation of the wound. This wound appears to be mainly venous in nature due to the bilateral lower extremity venous stasis/lymphedema. This is definitely much worse on her left than the right side. She does have type 1 diabetes mellitus, hypertension, morbid obesity, and is wheelchair dependent.during the course of the hospital stay a blood culture was also obtained and fortunately appeared negative. She also had an x-ray of the tibia/fibula on the left which showed no acute bone abnormality. Her white blood cell count which was performed last on 03/25/16 was 7.3, hemoglobin 12.8, protein 7.1, albumin 3.0. Her urine culture appeared to be negative for any specific organisms. Patient did have a left lower extremity venous duplex evaluation for DVT . This did not include venous reflux studies but fortunately was negative for DVT Patient also had arterial studies performed which revealed that she had a . normal ABI on the right though this was unable to be performed on the left secondary to pain that she was having around the ankle region due to the wound. However it was stated on report that she had biphasic pulses and apparently good blood flow. ========== 06/03/17 she is here in follow-up evaluation for right lower extremity ulcer. The right lower sure he has healed but she has reopened to the left medial malleolus and dorsal foot with weeping. She is waiting for new compression garments to arrive from home health, the previous compression garments were ill fitting. We will continue with compression bilaterally and  follow-up in 2 weeks Readmission: 08/05/17 on evaluation today patient appears to be doing somewhat poorly in regard to her left lower extremity especially although the right lower extremity has a small area which may no longer be open. She has been having a lot of drainage from the left lower extremity however he tells me that she has not been able to use the EXTREMIT-EASE Compression at this point. She states that she did better and was able to actually apply the Juxta-Lite compression although the wound that she has is too large and therefore really does not compress which is why she cannot wear it at this point. She has no one who can help her put it on regular basis her son can sometimes but he's not able to do it most of the time. I do believe that's why she has begun to weave and have issues as she is currently yet again. No fevers, chills, nausea, or vomiting noted at this time. Patient is no evidence of dementia. 08/12/17 on evaluation today patient appears to still be doing fairly well in regard to the draining areas/weeping areas at this point. With that being said she unfortunately did go to the ER yesterday due to what was felt to be possibly a cellulitis. They place her on doxycycline by mouth and discharge her home. She definitely was not admitted. With that being said she states she has had more discomfort which has been unusual for her even compared to prior times and she's had infections.08/12/17 on evaluation today patient appears to still be doing fairly well in regard to the draining areas/weeping areas at this point. With that being said she unfortunately did go to the ER yesterday due to  what was felt to be possibly a cellulitis. They place her on doxycycline by mouth and discharge her home. She definitely was not admitted. With that being said she states she has had more discomfort which has been unusual for her even compared to prior times and she's had infections. 08/19/17 put  evaluation today patient tells me that she's been having a lot of what sounds to be neuropathic type pain in regard to her left lower extremity. She has been using over-the-counter topical bins again which some believe. That in order to apply the she actually remove the wrap we put on her last Wednesday on Thursday. Subsequently she has not had anything on compression wise since that time. The good news is a lot of the weeping areas appear to have closed at this point again I believe she would do better with compression but we are struggling to get her to actually use what she needs to at this point. No fevers, chills, nausea, or vomiting noted at this time. 09/03/17 on evaluation today patient appears to be doing okay in regard to her lower extremities in regard to the lymphedema and weeping. Fortunately she does not seem to show any signs of infection at this point she does have a little bit of weeping occurring in the right medial malleolus area. With that being said this does not appear to be too significant which is good news. 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 She has severe skin damage secondary to both of these features involving the dorsal left foot and medial left ankle and lower leg. Still has open areas in the left anterior foot. The area on the right closed over. She uses her own juxta light stockings. She does not have an arterial issue 09/16/17 on evaluation today patient actually appears to be doing excellent in regard to her bilateral lower extremity swelling. The Juxta-Lite compression wrap seem to be doing very well for her. She has not however been using the portion that goes over her foot. Her left foot still is draining a little bit not nearly as significant as it has been in the past but still I do believe that she likely needs to utilize the full wrap including the foot portion of this will improve as  well. She also has been apparently putting on a significant amount of Vaseline which also think is not helpful for her. I recommended that if she feels she needs something for moisturizer Eucerin will probably be better. 09/30/17 on evaluation today patient presents with several new open areas in regard to her left lower extremity although these appear to be minimal and mainly seem to be more moisture breakdown than anything. Fortunately she does not seem to have any evidence of infection which is great news. She has been tolerating the dressing changes without complication we are using silver alginate on the foot she has been using AB pads to have the legs and using her Juxta- Lite compression which seems to be controlling her swelling very well. Overall I'm pleased with the poor way she has progressed. 10/14/17 on evaluation today patient appears to be doing better in regard to her left lower extremity areas of weeping. She does still have some discomfort although in general this does not appear to be as macerated and I think it is progressing nicely. I do think she still needs to wear the foot portion of her Juxta- Lite in order to get the  most benefit from the wrap obviously. She states she understands. Fortunately there does not appear to be evidence of infection at this time which is great news. 10/28/17 on evaluation today patient appears to be doing excellent in regard to her left lower extremity. She has just a couple areas that are still open and seem to be causing any trouble whatsoever. For that reason I think that she is definitely headed in the right direction the spots are very tiny compared to what we have been dealing with in the past. 11/11/17 on evaluation today patient appears to have a right lateral lower extremity ulcer that has opened since I last saw her. She states this is where the home health nurse that was coming out remove the dressing without wetting the alginate first.  Nonetheless I do not know if this is indeed the case or not but more importantly we have not ordered home help to be coming out for her wounds at all. I'm unsure as to why they are coming out and we're gonna have to check on this and get things situated in that regard. With that being said we currently really do not need them to be coming out as the patient has been taking care of her leg herself without complication and no issues. In fact she was doing much better prior to nursing coming out. 11/25/17 on evaluation today patient actually appears to be doing fairly well in regard to her left lower extremity swelling. In fact she has very little area of weeping at this point there's just a small spot on the lateral portion of her right leg that still has me just a little bit more concerned as far as wanting to see this clear up before I discharge her to caring for this at home. Nonetheless overall she has made excellent progress. 12/09/17 on evaluation today patient appears to be doing rather well in regard to her lower extremity edema. She does have some weeping still in the left lower extremity although the big area we were taking care of two weeks ago actually has closed and she has another area of weeping on the left lower extremity immediately as well is the top of her foot. She does not currently have lymphedema pumps she has been wearing her compression daily on a regular basis as directed. With that being said I think she may benefit from lymphedema pumps. 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 We had a very difficult time getting and keeping this under control. 12/23/17 on evaluation today patient actually appears to be doing a little bit more poorly in regard to her bilateral lower extremities. She has been tolerating the Juxta-Lite  compression wraps. Unfortunately she has two new ulcers on the right lower extremity and left lower Trinity ulceration seems to be larger. Obviously this is not good news. She has been tolerating the dressings without complication. 12/30/17 on evaluation today patient actually appears to be doing much better in regard to her bilateral lower extremity edema. She continues to have some issues with ulcerations and in fact there appears to be one spot on each leg where the wrap may have caused a little bit of a blister which is subsequently opened up at this point is given her pain. Fortunately it does not appear to be any evidence of infection which is  good news. No fevers chills noted. 01/13/18 on evaluation today patient appears to be doing rather well in regard to her bilateral lower extremities. The dressings did get kind of stuck as far as the wound beds are concerned but again I think this is mainly due to the fact that she actually seems to be showing signs of healing which is good news. She's not having as much drainage therefore she was having more of the dressing sticking. Nonetheless overall I feel like her swelling is dramatically down compared to previous. 01/20/18 on evaluation today patient unfortunately though she's doing better in most regards has a large blister on the left anterior lower extremity where she is draining quite significantly. Subsequently this is going to need debridement today in order to see what's underneath and ensure she does not continue to trapping fluid at this location. Nonetheless No fevers, chills, nausea, or vomiting noted at this time. 01/27/18 on evaluation today patient appears to be doing rather well at this point in regard to her right lower extremity there's just a very small area that she still has open at this point. With that being said I do believe that she is tolerating the compression wraps very well in making good progress. Home health is coming out at  this point to see her. Her left lower extremity on the lateral portion is actually what still mainly open and causing her some discomfort for the most part 02/10/18 on evaluation today patient actually appears to be doing very well in regard to her right lower extremity were all the ulcers appear to be completely close. In regard to the left lower extremity she does have two areas still open and some leaking from the dorsal surface of her foot but this still seems to be doing much better to me in general. 02/24/18 on evaluation today patient actually appears to be doing much better in regard to her right lower extremity this is still completely healed. Her left lower extremity is also doing much better fortunately she has no evidence of infection. The one area that is gonna require some debridement is still on the left anterior shin. Fortunately this is not hurting her as badly today. 03/10/18 on evaluation today patient appears to be doing better in some regards although she has a little bit more open area on the dorsal foot and she also has some issues on the medial portion of the left lower extremity which is actually new and somewhat deep. With that being said there fortunately does not appear to be any significant signs of infection which is good news. No fevers, chills, nausea, or vomiting noted at this time. In general her swelling seems to be doing fairly well which is good news. 03/31/18 on evaluation today patient presents for follow-up concerning her left lower extremity lymphedema. Unfortunately she has been doing a little bit more poorly since I last saw her in regard to the amount of weeping that she is experiencing. She's also having some increased pain in the anterior shin location. Unfortunately I do not feel like the patient is making such good progress at this point a few weeks back she was definitely doing much better. 04/07/18 on evaluation today patient actually appears to be showing  some signs of improvement as far as the left lower extremity is concerned. She has been tolerating the dressing changes and it does appear that the Drawtex did better for her. With that being said unfortunately home health is stating that they  cannot obtain the Drawtex going forward. Nonetheless we're gonna have to check and see what they may be able to get the alginate they were using was getting stuck in causing new areas of skin being pulled all that with and subsequently weep and calls her to worsen overall this is the first time we've seen improvement at this time. 04/14/18 on evaluation today patient actually appears to be doing rather well at this point there does not appear to be any evidence of infection at this time and she is actually doing excellent in regard to the weeping in fact she almost has no openings remaining even compared to just last week this is a dramatic improvement. No fevers chills noted 04/21/18 evaluation today patient actually appears to be doing very well. She in fact is has a small area on the posterior lower extremity location and she has a small area on the dorsal surface of her foot that are still open both of which are very close to closing. We're hoping this will be close shortly. She brought her Juxta-Lite wrap with her today hoping that would be able to put her in it unfortunately I don't think were quite at that point yet but we're getting closer. 04/28/18 upon evaluation today patient actually appears to be doing excellent in regard to her left lower extremity ulcer. In fact the region on the posterior lower extremity actually is much smaller than previously noted. Overall I'm very happy with the progress she has made. She again did bring her Juxta-Lite although we're not quite ready for that yet. 05/11/18 upon evaluation today patient actually appears to be doing in general fairly well in regard to her left lower Trinity. The swelling is very well  controlled. With that being said she has a new area on the left anterior lower extremity as well as between the first and second toes of her left foot that was not present during the last evaluation. The region of her posterior left lower extremity actually appears to be almost completely healed. T be honest I'm very pleased with o the way that stands. Nonetheless I do believe that the lotion may be keeping the area to moist as far as her legs are concerned subsequently I'm gonna consider discontinuing that today. 05/26/18 on evaluation today patient appears to be doing rather well in regard to her left lower should be ulcers. In fact everything appears to be close except for a very small area on the left posterior lower extremity. Fortunately there does not appear to be any evidence of infection at this time. Overall very pleased with her progress. 06/02/18 and evaluation today patient actually appears to be doing very well in regard to her lower extremity ulcers. She has one small area that still continues to weep that I think may benefit her being able to justify lotion and user Juxta-Lite wraps versus continued to wrap her. Nonetheless I think this is something we can definitely look into at this point. 06/23/18 on evaluation today patient unfortunately has openings of her bilateral lower extremities. In general she seems to be doing much worse than when I last saw her just as far as her overall health standpoint is concerned. She states that her discomfort is mainly due to neuropathy she's not having any other issues otherwise. No fevers, chills, nausea, or vomiting noted at this time. 06/30/18 on evaluation today patient actually appears to be doing a little worse in regard to her right lower extremity her left lower extremity  of doing fairly well. Fortunately there is no sign of infection at this time. She has been tolerating the dressing changes without complication. Home health did not come out  like they were supposed to for the appropriate wrap changes. They stated that they never received the orders from [place] which were fax. Nonetheless we will send a copy of the orders with the patient today as well. 07/07/18 on evaluation today patient appears to be doing much better in regard to lower extremities. She still has several openings bilaterally although since I last saw her her legs did show obvious signs of infection when she later saw her nurse. Subsequently a culture was obtained and she is been placed on Bactrim and Keflex. Fortunately things seem to be looking much better it does appear she likely had an infection. Again last week we'd even discussed it but again there really was not any obvious sign that she had infection therefore we held off on the antibiotics. Nonetheless I'm glad she's doing better today. 07/14/18 on evaluation today patient appears to be doing much better regarding her bilateral lower Trinity's. In fact on the right lower for me there's nothing open at this point there are some dry skin areas at the sites where she had infection. Fortunately there is no evidence of systemic infection which is excellent news. No fevers chills noted 07/21/18 on evaluation today patient actually appears to be doing much better in regard to her left lower extremity ulcers. She is making good progress and overall I feel like she's improving each time I see her. She's having no pain I do feel like the infection is completely resolved which is excellent news. No fevers, chills, nausea, or vomiting noted at this time. 07/28/18 on evaluation today patient appears to be doing very well in regard to her left lower [employer]. Everything seems to be showing signs of improvement which is excellent news. Overall very pleased with the progress that has been made. Fortunately there's no evidence of active infection at this time also excellent news. 08/04/18 on evaluation today patient appears to be  doing more poorly in regard to her bilateral lower extremities. She has two new areas open up on the right and these were completely closed as of last week. She still has the two spots on the left which in my pinion seem to be doing better. Fortunately there's no evidence of infection again at this point. 08/11/18 on evaluation today patient actually appears to be doing very well in regard to her bilateral lower Trinity wounds that all seem to be doing better and are measures smaller today. Fortunately there's no signs of infection. No fevers, chills, nausea, or vomiting noted at this time. 08/18/18 on evaluation today patient actually appears to be doing about the same inverter bilateral lower extremities. She continues to have areas that blistering open as was drain that fortunately nothing too significant. Overall I feel like Drawtex may have done better for her however compared to the collagen. 08/25/18 on evaluation today patient appears to be doing a little bit more poorly today even compared to last time I saw her. Again I'm not exactly sure why she's making worse progress over the past several weeks. I'm beginning to wonder if there is some kind of underlying low level infection causing this issue. I did actually take a culture from the left anterior lower extremity but it was a new wound draining quite a bit at this point. Unfortunately she also seems to be  having more pain which is what also makes me worried about the possibility of infection. This is despite never erythema noted at this point. 09/01/18 on evaluation today patient actually appears to be doing a little worse even compared to last week in regard to bilateral lower extremities. She did go to the hospital on the 16th was given a dose of IV Zosyn and then discharged with a recommendation to continue with the Bactrim that I previously prescribed for her. Nonetheless she is still having a lot of discomfort she tells me as well at this  time. This is definitely unfortunate. No fevers, chills, nausea, or vomiting noted at this time. 09/08/18 on evaluation today patient's bilateral lower extremities actually appear to be shown signs of improvement which is good news. Fortunately there does not appear to be any signs of active infection I think the anabiotic is helping in this regard. Overall I'm very pleased with how she is progressing. 09/15/18 patient was actually seen in ER yesterday due to her legs as well unfortunately. She states she's been having a lot of pain and discomfort as well as a lot of drainage. Upon inspection today the patient does have a lot of swelling and drainage I feel like this is more related to lymphedema and poor fluid control than it is to infection based on what I'm seeing. The physician in the emergency department also doubted that the patient was having a significant infection nonetheless I see no evidence of infection obvious at this point although I do see evidence of poor fluid control. She still not using a compression pumps, she is not elevating due to her lift chair as well as her hospital bed being broken, and she really is not keeping her legs up as much as they should be and also has been taking off her wraps. All this combined I think has led to poor fluid control and to be honest she may be somewhat volume overloaded in general as well. I recommend that she may need to contact your physician to see if a prescription for a diuretic would be beneficial in their opinion. As long as this is safe I think it would likely help her. 09/29/18 on evaluation today patient's left lower extremity actually appears to be doing quite a bit better. At least compared to last time that I saw her. She still has a large area where she is draining from but there's a lot of new skin speckled trout and in fact there's more new skin that there are open areas of weeping and drainage at this point. This is good news. With  regard to the right lower extremity this is doing much better with the only open area that I really see being a dry spot on the right lateral ankle currently. Fortunately there's no signs of active infection at this time which is good news. No fevers, chills, nausea, or vomiting noted at this time. The patient seems somewhat stressed and overwhelmed during the visit today she was very lethargic as such. She does and she is not taking any pain medications at this point. Apparently according to her husband are also in the process of moving which is probably taking its toll on her as well. 10/06/18 on evaluation today patient appears to be doing rather well in regard to her lower extremities compared to last evaluation. Fortunately there's no signs of active infection. She tells me she did have an appointment with her primary. Nonetheless he was concerned that the  wounds were somewhat deep based on pictures but we never actually saw her legs. She states that he had her somewhat worried due to the fact that she was fearing now that she was [place] have to have an amputation. With that being said based on what I'm seeing check she looks better this week that she has the last two times I've seen her with much less drainage I'm actually pleased in this regard. That doesn't mean that she's out of the water but again I do not think what the point of talking about education at all in regard to her leg. She is very happy to hear this. She is also not having as much pain as she was having last week. 10/13/18 unfortunately on evaluation today patient still continues to have a significant amount of drainage she's not letting home health actually apply the compression dressings at this point. She's trying to use of Juxta-Lite of the top of Kerlex and the second layer of the three layer compression wrap. With that being said she just does not seem to be making as good a progress as I would expect if she was having the  compression applied and in place on a regular basis. No fevers, chills, nausea, or vomiting noted at this time. 10/20/18 on evaluation today patient appears to be doing a little better in regard to her bilateral lower extremity ulcers. In fact the right lower extremity seems to be healed she doesn't even have any openings at this point left lower extremity though still somewhat macerated seems to be showing signs of new skin growth at multiple locations throughout. Fortunately there's no evidence of active infection at this time. No fevers, chills, nausea, or vomiting noted at this time. 10/27/18 on evaluation today patient appears to be doing much better in regard to her left lower Trinity ulcer. She's been tolerating the laptop complication and has minimal drainage noted at this point. Fortunately there's no signs of active infection at this time. No fevers, chills, nausea, or vomiting noted at this time. 11/03/18 on evaluation today patient actually appears to be doing excellent in regard to her left lower extremity. She is having very little drainage at this point there does not appear to be any significant signs of infection overall very pleased with how things have gone. She is likewise extremely pleased still and seems to be making wonderful progress week to week. I do believe antibiotics were helpful for her. Her primary care provider did place on amateur clean since I last saw her. 11/17/18 on evaluation today patient appears to be doing worse in regard to her bilateral lower extremities at this point. She is been tolerating the dressing changes without complication. With that being said she typically takes the Coban off fairly quickly upon arriving home even after being seen here in the clinic and does not allow home health reapply command as part of the dressing at home. Therefore she said no compression essentially since I last saw her as best I can tell. With that being said I think it shows  and how much swelling she has in the open wounds that are noted at this point. Fortunately there's no signs of infection but unfortunately if she doesn't get this under control I think she will end up with infection and more significant issues. 11/24/18 on evaluation today patient actually appears to be doing somewhat better in regard to her bilateral lower extremities. She still tells me she has not been using her compression  pumps she tells me the reason is that she had gout of her right great toe and listen to much pain to do this over the past week. Nonetheless that is doing better currently so she should be able to attempt reinitiating the lymphedema pumps at this time. No fevers, chills, nausea, or vomiting noted at this time. 12/01/18 upon evaluation today patient's left lower extremity appears to be doing quite well unfortunately her right lower extremity is not doing nearly as well. She has been tolerating the dressing changes without complication unfortunately she did not keep a wrap on the right at this time. Nonetheless I believe this has led to increased swelling and weeping in the world is actually much larger than during the last evaluation with her. 12/08/18 on evaluation today patient appears to be doing about the same at this point in regard to her right lower extremity. There is some more palatable to touch I'm concerned about the possibility of there being some infection although I think the main issue is she's not keeping her compression wrap on which in turn is not allowing this area to heal appropriately. 12/22/18 on evaluation today patient appears to be doing better in regard to left lower extremity unfortunately significantly worse in regard to the right lower extremity. The areas of blistering and necrotic superficial tissue have spread and again this does not really appear to be signs of infection and all she just doesn't seem to be doing nearly as well is what she has been in  the past. Overall I feel like the Augmentin did absolutely nothing for her she doesn't seem to have any infection again I really didn't think so last time either is more of a potential preventative measure and hoping that this would make some difference but I think the main issue is she's not wearing her compression. She tells me she cannot wear the [place] been we put on she takes it off pretty much upon getting home. Subsequently she worshiped Juxta-Lite when I questioned her about how often she wears it this is no more than three hours a day obviously that leaves 21 hours that she has no compression and this is obviously not doing well for her. Overall I'm concerned that if things continue to worsen she is at great risk of both infection as well as losing her leg. 01/05/19 on evaluation today patient appears to be doing well in regard to her left lower extremity which he is allowing [place] to wrap and not so well with regard to her right lower extremity which she is not allowing [place] to really wrap and keep the wrap on. She states that it hurts too badly whenever it's wrapped and she ends up having to take it off. She's been using the Juxta-Lite she tells me up to six hours a day although I question whether or not that's really been the case to be honest. Previously she told me three hours today nonetheless obviously the legs as long as the wrap is doing great when she is not is doing much more poorly. 01/12/2019 on evaluation today patient actually appears to be doing a little better in my opinion with regard to her right lower extremity ulcer. She has a small open area on the left lower extremity unfortunately but again this I think is part of the normal fluctuation of what she is going to have to expect with regard to her legs especially when she is not using her lymphedema pumps on a regular  basis. Subsequently based on what I am seeing today I think that she does seem to be doing slightly better with  regard to her right lower extremity she did see her primary care provider on Monday they felt she had an infection and placed her on 2 antibiotics. Both Cipro and clindamycin. Subsequently again she seems possibly to be doing a little bit better in regards to the right lower extremity she also tells me however she has been wearing the compression wrap over the past week since I spoke with her as well that is a Kerlix and Coban wrap on the right. No fevers, chills, nausea, vomiting, or diarrhea. 01/19/2019 on evaluation today patient appears to be doing better with regard to her bilateral lower extremities especially the right. I feel like the compression has been beneficial for her which is great news. She did get a call from her primary care provider on her way here today telling her that she did have methicillin- resistant Staphylococcus aureus and he was calling in a couple new antibiotics for her including a ointment to be applied she tells me 3 times a day. With that being said this sounds like likely to be Bactroban which I think could be applied with each dressing/wrap change but I would not be able to accommodate her applying this 3 times a day. She is in agreement with the least doing this we will add that to her orders today. 01/26/2019 on evaluation today patient actually appears to be doing much better with regard to her right lower extremity. Her left lower extremity is also doing quite well all things considering. Fortunately there is no evidence of active infection at this time. No fevers, chills, nausea, vomiting, or diarrhea. 02/02/2019 on evaluation today patient appears to be doing much better compared to her last evaluation. Little by little off like her right leg is returning more towards normal. There does not appear to be any signs of active infection and overall she seems to be doing quite well which is great news. I am very pleased in this regard. No fevers, chills, nausea,  vomiting, or diarrhea. 02/09/2019 upon evaluation today patient appears to be doing better with regard to her bilateral lower extremities. She has been tolerating the dressing changes without complication. Fortunately there is no signs of active infection at this time. No fevers, chills, nausea, vomiting, or diarrhea. 02/23/2019 on evaluation today patient actually appears to be doing quite well with regard to her bilateral lower extremities. She has been tolerating the dressing changes without complication. She is even used her pumps one time and states that she really felt like it felt good. With that being said she seems to be in good spirits and her legs appear to be doing excellent. 03/09/2019 on evaluation today patient appears to be doing well with regard to her right lower extremity there are no open wounds at this time she is having some discomfort but I feel like this is more neuropathy than anything. With regard to her left lower extremity she had several areas scattered around that she does have some weeping and drainage from but again overall she does not appear to be having any significant issues and no evidence of infection at this time which is good news. 03/23/2019 on evaluation today patient appears to be doing well with regard to her right lower extremity which she tells me is still close she is using her juxta light here. Her left lower extremity she mainly just has  an area on the foot which is still slightly draining although this also is doing great. Overall very pleased at this time. 04/06/2019 patient appears to be doing a little bit worse in regard to her left lower extremity upon evaluation today. She feels like this could be becoming infected again which she had issues with previous. Fortunately there is no signs of systemic infection but again this is always a struggle with her with her legs she will go from doing well to not so well in a very short amount of time. 04/20/2019 on  evaluation today patient actually appears to be doing quite well with regard to her right lower extremity I do not see any signs of active infection at this time. Fortunately there is no fever chills noted. She is still taking the antibiotics which I prescribed for her at this point. In regard to the left lower extremity I do feel like some of these areas are better although again she still is having weeping from several locations at this time. 04/27/2019 on evaluation today patient appears to be doing about the same if not slightly worse in regard to her left lower extremity ulcers. She tells me when questioned that she has been sleeping in her Hoveround chair in fact she tells me she falls asleep without even knowing it. I think she is spending a whole lot of time in the chair and less time walking and moving around which is not good for her legs either. On top of that she is in a seated position which is also the worst position she is not really elevating her legs and she is also not using her lymphedema pumps. All this is good to contribute to worsening of her condition in general. 05/18/2019 on evaluation today patient appears to be doing well with regard to her lower extremity on the right in fact this is showing no signs of any open wounds at this time. On the left she is continuing to have issues with areas that do drain. Some of the regions have healed and there are couple areas that have reopened. She did go to the ER per the patient according to recommendations from the home health nurse due to what she was seen when she came out on 05/13/2019. Subsequently she felt like the patient needed to go to the hospital due to the fact that again she was having "milky white discharge" from her leg. Nonetheless she had and then was placed on doxycycline and subsequently seems to be doing better. 06/01/2019 upon evaluation today patient appears to be doing really in my opinion about the same. I do not  see any signs of active infection which is good news. Overall she still has wounds over the bilateral lower extremities she has reopened on the right but this appears to be more of a crack where there is weeping/edema coming from the region. I do not see any evidence of infection at either site based on what I visualized today. 07/13/2019 upon evaluation today patient appears to be doing a little worse compared to last time I saw her. She since has been in the hospital from 06/21/2019 through 06/29/2019. This was secondary to having Covid. During that time they did apply lotion to her legs which unfortunately has caused her to develop a myriad of open wounds on her lower extremities. Her legs do appear to be doing better as far as the overall appearance is concerned but nonetheless she does have more open and weeping  areas. 07/27/2019 upon evaluation today patient appears to be doing more poorly to be honest in regard to her left lower extremity in particular. There is no signs of systemic infection although I do believe she may have local infection. She notes she has been having a lot of blue/green drainage which is consistent potentially with Pseudomonas. That may be something that we need to consider here as well. The doxycycline does not seem to have been helping. 08/03/2019 upon evaluation today patient appears to be doing a little better in my opinion compared to last week's evaluation. Her culture I did review today and she is on appropriate medications to help treat the Enterobacter that was noted. Overall I feel like that is good news. With that being said she is unfortunately continuing to have a lot of drainage and though it is doing better I still think she has a long ways to go to get things dried up in general. Fortunately there is no signs of systemic infection. 08/10/2019 upon evaluation today patient appears to be doing may be slightly better in regard to her left lower extremity the  right lower extremity is doing much better. Fortunately there is no signs of infection right now which is good news. No fevers, chills, nausea, vomiting, or diarrhea. 08/24/2019 on evaluation today patient appears to be doing slightly better in regard to her lower extremities. The left lower extremity seems to be healed the right lower extremity is doing better though not completely healed as far as the openings are concerned. She has some generalized issues here with edema and weeping secondary to her lymphedema though again I do believe this is little bit drier compared to prior weeks evaluations. In general I am very pleased with how things seem to be progressing. No fevers, chills, nausea, vomiting, or diarrhea. 08/31/2019 upon evaluation today patient actually seems to making some progress here with regard to the left lower extremity in particular. She has been tolerating the dressing changes without complication. Fortunately there is no signs of active infection at this time. No fevers, chills, nausea, vomiting, or diarrhea. She did see Dr. Doren Custard and he did note that she did have a issue with the left great saphenous vein and the small saphenous vein in the leg. With that being said he was concerned about the possibility of laser ablation not being extremely successful. He also mentioned a small risk of DVT associated with the procedure. However if the wounds do not continue to improve he stated that that would probably be the way to go. Fortunately the patient's legs do seem to be doing much better. 09/07/2019 upon evaluation today patient appears to be doing better with regard to her lower extremities. She has been tolerating the dressing changes without complication. With that being said she is showing signs of improvement and overall very pleased. There are some areas on her leg that I think we do need to debride we discussed this last week the patient is in agreement with doing that as long as  it does not hurt too badly. 09/14/2019 upon evaluation today patient appears to be doing decently well with regard to her left lower extremity. She is not having near as much weeping as she has had in the past things seem to be drying up which is good news. There is no signs of active infection at this time. 09/21/19 upon evaluation today patient appears to be doing better in regard overall to her bilateral lower extremities. She again has  less open than she did previous and each week I feel like this is getting better. Fortunately there is no signs of active infection at this time. No fevers, chills, nausea, vomiting, or diarrhea. 09/28/2019 upon evaluation today patient actually appears to be showing signs of improvement with regard to her left lower extremity. Unfortunately the right medial lower extremity around the ankle region has reopened to some degree but this appears to be minimal still which is good news. There is no signs of active infection at this time which is also good news. 10/12/2019 upon evaluation today patient appears to be doing okay with regard to her bilateral lower extremities today. The right is a little bit worse then last evaluation 2 weeks ago. The left is actually doing a little better in my opinion. Overall there is no signs of active infection at this time that I see. Obviously that something we have to keep a close eye on she is very prone to this with the significant and multiple openings that she has over the bilateral lower extremities. 10/19/2019 upon evaluation today patient appears to be doing about the best that I have seen her in quite some time. She has been tolerating the dressing changes without complication. There does not appear to be any signs of active infection and overall I am extremely happy with the way her legs appeared. She is drying up quite nicely and overall is having less pain. 11/09/2019 upon evaluation today patient appears to be doing better in  regard to her wounds. She seems to be drying up more and more each time I see her this is just taking a very long time. Fortunately there is no signs of active infection at this time. 11/23/2019 upon evaluation today patient actually appears to be doing excellent in regard to her lower extremities at this point compared to where she has been. Fortunately there is no signs of active infection at this time. She did go to the hospital last week for nausea and vomiting completely unrelated to her wounds. Fortunately she is doing better she was given some Reglan and got better. She had associated abdominal pain but they never found out what was going on. 12/07/2019 upon evaluation today patient appears to be doing well for the most part in regard to her legs. She unfortunately has not been keeping the [place] portion of her wraps on therefore the compression has not really been sufficient for what it is supposed to be. Nonetheless she tells me that it just hurt too bad therefore she removed it. 12/21/2019 upon evaluation today patient actually appears to be doing quite well with regard to her legs. I do feel like she has been making progress which is great news and overall there is no signs of active infection at this time. No fevers, chills, nausea, vomiting, or diarrhea. 01/04/2020 upon evaluation today patient presents for follow-up concerning her lower extremity edema bilaterally. She still has open wounds she has not been using her lymphedema pumps. She is also not been utilizing her compression wraps appropriately she tends to unwrap them, take them off, or states that they hurt. Obviously the reason they hurt is because her legs start to swell but the issue is if she would use her compression/lymphedema pumps regularly she would not swell and she would have the pain. Nonetheless she has not even picked them up once honestly over the past several months and may be even as much as in the past year based on my  opinion and what have seen. She tells me today that after last week when I talked about this with her specifically actually that was 2 weeks ago that she "forgot". 8//21 on evaluation today patient appears to be doing a little better in regard to her legs bilaterally. Fortunately there is no signs of active infection at this time. She tells me that she used her lymphedema pumps all of one time over the past 2 weeks since I last saw her. She tells me that she has been too busy in order to continue to use these. 02/01/2020 on evaluation today patient appears to be doing some better in regard to her wounds in general in her legs. We felt the right was healed although is not completely it does appear to be doing better she tells me she has been using her lymphedema pumps that she has had this six times since I last saw her. Obviously the more she does that the better she would do my opinion 02/15/2020 upon evaluation today patient appears to be doing about the same in regard to her legs. She tells me that she is pumping I'm still not sure how much she does to be perfectly honest. However even if she does a little bit here and there I guess that is better than nothing. Fortunately there is no sign of active infection at this time which is great news. No fevers, chills, nausea, vomiting, or diarrhea. 02/29/2020 on evaluation today patient actually appears to be doing quite well all things considered this week. She has been tolerating the dressing changes without complication. Fortunately there is no signs of active infection at this time. No fevers, chills, nausea, vomiting, or diarrhea. 03/14/2020 upon evaluation today patient appears to be doing really about the same in regard to her legs. There is no signs of improvement overall and she as noted from home health does not appear to be elevating her legs he can get into her lift chair. There is too much stuff piled up on it the patient tells me. She also tells  me she cannot really use her pumps effectively due to the fact that she cannot have any space to get them on. Finally she is also not really elevating her legs because she is not sleeping in her bed she is sleeping in her chair currently and again overall I think everything that she is done in combination has been exactly the wrong thing for what she needs for her legs. 04/04/2020 upon evaluation today patient appears to be doing well at this time with regard to her legs. She is actually been pumping, keeping her wraps on, and to be honest she seems to be doing dramatically better the right leg is excellent the left leg is also excellent and measuring much smaller than previous. 04/18/2020 upon evaluation today patient actually is continue to make good progress in regard to her lower extremities bilaterally. Everything is improving and less wet that has been in the past overall I am extremely pleased with where things stand and I think that she is making great progress. The patient tells me she still continue to use her compression pumps 05/02/2020 on evaluation today patient appears to be doing well at this time in regard to her left leg which is showing signs of drying up. With that being said she does have a lot of lymphedema type crusty skin around the toes of her left foot and the right medial ankle which has opened at this point.  Fortunately there is no signs of active infection systemically at this point or even locally for that matter. 05/23/2020 on evaluation today patient appears to be doing well with regard to her lower extremities. Fortunately there is no signs of active infection at this time. No fever chills noted. She has been very depressed however she tells me. 06/06/2020 patient came in today for evaluation in regard to her bilateral lower extremity ulcerations. With that being said she came in feeling okay and actually laughing and joking around with the staff checking her in.  Subsequently however she had a coughing spell and following the coughing spell it was a dramatic conversion from being jovial and joking around to being extremely short of breath her vital signs actually dropped in regard to her blood pressure from around 175 to down around 694 for systolic and from around 96 diastolic down to around 70. With that being said she also accompanied this with an increase in her respiratory rate which was also quite significant. Nonetheless I actually upon going into see her was extremely worried we called EMS to have her transported to the ER for further evaluation and treatment. She continued until EMS got here to be extremely short of breath even on 6 L of oxygen. Her oxygen saturation did come up to 99% but overall it was only 95 before which was not terrible she did feel like the oxygen helped her feel somewhat better however. She has a history of asthma but again even the coughing spell really should not have done this degree of alteration in her demeanor from where she was just before to after the coughing spell. She tells [place] however she has been feeling somewhat abnormal since Friday although she could not really pinpoint exactly what was going on. 06/27/2020 plan evaluation today patient appears to be doing okay in regard to her leg ulcers. She is really not showing a lot of improvement to be honest and she still has a lot of weeping. With that being said after I last saw her we called EMS to take her to the hospital she actually did not go she went to see her primary care provider who according to the patient have not identified and read through the note states that everything checked out okay. Nonetheless the patient has continued to have bouts where she gets very short of breath for seemingly no reason whatsoever. That happened even the same night that I saw her last time. This was after she refused transport to the hospital. With that being said she also tells me  that just getting dressed to come here is quite a chore which is putting on her shirt causing her to have to stop to catch her breath. Obviously this is not normal and I feel like there is something going on. She may need a referral to be seen by cardiology ASAP. She does see her primary care provider early next week she tells me 07/11/2020 upon evaluation today patient's wounds again appear to be doing about the same she mainly has weeping of the bilateral lower extremities both are very swollen today she tells me she has been using her pumps but last time I saw her she told me she did not even have her pumps out that she had "packed them away because they had too much stuff in their house. Apparently her husband has been bringing stuff in from storage units. She then subsequently told me that she had so much stuff in her  house that she has been staying up all night and did not go to bed till 7:00 this morning because she was cleaning up stuff. Nonetheless I am still unsure as to whether or not she is using the pumps based on what I am seeing I would think probably not. Nonetheless she has been keeping the compression wraps in place that is at least something 07/25/2020 upon evaluation today patient appears to be doing well currently in regard to her legs all things considered. I do not think she is doing any worse significantly although honestly I do not think she is doing a lot better either. There does not appear to be any signs of infection which is good news although she does have a lot of weeping. She is using her pumps she tells me sporadically though she says that her husband is not a big fan of helping her to get the [place]. She also tells me is difficult because in their house. It would be easiest for her to use these as where her son is actually staying with her and living out at this point. Obviously I understand that she is having a lot of issues here and to be honest I do not really know  what to say in that regard and the fact that I really cannot change the situations but I do believe she really needs to be using the lymphedema pumps to try to keep things under control here. Outside of that I think she is going to continue to have significant issues. I think the pumps coupled with good compression and elevation are the only things that are to help her at this point. 08/08/2020 upon evaluation today patient appears to be doing a little worse in regard to her legs in general. I think this may be due to some infection currently. With that being said I am going to go ahead and likely see about putting her on an antibiotic. Were also can obtain a culture today where she had some purulent drainage. 08/29/2020 upon evaluation today patient appears to be doing about the same in regard to her bilateral lower extremities. Unfortunately she is continuing to have significant issues here with edema and she has significant lymphedema. With that being said she is really not doing anything that she is supposed to be doing as far as elevation, compression, using lymphedema pumps or anything really for that matter. She does not use her pumps, is taken the wraps off shortly after having them put on as far as home health is concerned at least by the next day she tells me, and overall is not really elevating her legs she is telling me she has a lot to do as far as cleaning up in her house. Nonetheless this triad of noncompliance is leading to worsening not improving in regard to her legs. I had a very strong conversation with her about this today again. 09/12/2020 on evaluation today patient appears to be doing poorly with regard to her lower extremities bilaterally. Fortunately there does not appear to be any signs of active infection at this point. That is systemically. Locally there is some signs of pus coming from an area on her left leg. She has been on antibiotics recently though she is done with  those currently. Nonetheless she tells me she has been having increased pain with the right leg this is extremely swollen as well. Fortunately there does not appear to be significant infection of the right leg though I think  her pain is really as result of the increased swelling she has been declining the wrap on the right leg and the wounds here are getting significantly worse week by week. This obviously is not will be want to see. I discussed with the patient however if she does not use the wraps, use her pumps, and elevate her legs that she is not to get better. She has been sleeping in her motorized Hoveround which does not even allow her to elevate her legs at all. She tells me that she is also constantly "cleaning up as her husband told her that he wants to move back to [place]." Unfortunately this has been the narrative for the past several months in fact that she tells me that she has been having to clean up mass that was moved in from storage units into their home and that she does not really sleep and she never really elevates her legs. I explained to the patient today that if she is not can I do any of this that there is really nothing that I can do to help her. 09/26/20 upon evaluation today patient appears to be doing about the same in regard to her wounds. Fortunately there is no signs of active infection at this time. No fever chills noted. She has been tolerating the dressing changes without complication which is good news. With that being said I do think that she unfortunately is still having a lot of pain but I think this is due to the swelling that really is not controlled and as I discussed with her last time she needs to be using her lymphedema pumps there is still in the box she has not even attempted to use those whatsoever. I am also not even certain she is taking her fluid pills as she states she may need to have "get a refill" she was seen with her husband at the  appointment today. 10/24/2020 upon evaluation today patient appears to be doing decently well in regard to her legs all things considered. She has been tolerating the dressing changes without complication. Fortunately there is no signs of active infection at this time. No fevers, chills, nausea, vomiting, or diarrhea. With that being said the patient does appear to have some excessive thickened skin buildup currently that has been require some sharp debridement to clear this away. I am hopeful if we do this we will be able to get some of this area to dry out this otherwise trapping fluid underneath. Her son is present during the office visit today. She tells me she still been doing a lot as far as cleaning up and going through boxes she tells me she has been up for a long time already this morning. 5/25; patient presents for 2-week follow-up. She had 3 layer compression wrap placed with calcium alginate underneath. She reports tolerating the wraps well. She has home health that changes the wrap 2-3 times a week. She denies signs of infection. She has almost finished her course of antibiotics. She uses her lymphedema pumps once weekly. 11/21/2020 upon evaluation today patient appears to be doing well with regard to her legs all things considered. Fortunately there is no signs of active infection at this time which is great news I do not see any evidence of infection and overall I think that she is definitely improved compared to where things did previous. Nonetheless I do think that she may benefit from removing some of the thicker skin on the legs. Again  we have attempted this in the past to some degree but I think that we might be able to more effectively do this of a clear some way and then potentially use a urea cream with salicylic acid to try to help clear some of this away. The patient is in agreement with the plan. 12/05/2020 upon evaluation today patient appears to be doing well with regard to her  wounds in general. I feel like that things are somewhat improved the skin looking a little better. With that being said she still has significant lymphedema. She is not been using her lymphedema pumps and not really been elevating her legs. She also did have some maggots noted on the left leg she tells me that the "gnats are terrible and get all over her foot." With that being said I am concerned about the fact that the patient still is quite swollen even with the compression wraps I really feel like she needs to be using her pumps but I cannot talk her into doing this. 12/19/2020 upon evaluation today patient appears to be doing about the same in regard to her legs. Fortunately I do not see any signs of infection unfortunately she really seems to be very nonchalant about the fact that she has not been using her lymphedema pumps. She tells me that "I just forget". With that being said this needs to be something that is a priority for her I cannot count the number of times that have had this discussion with her and yet nothing seems to change. With that being said I think that is very unlikely she is ever really getting healed with regard to her wounds to be honest she does not seem to be motivated to do anything to try to help at this point despite everything that I have tried to do and recommend for her. 01/02/2021 upon evaluation today patient actually appears to be doing excellent in regard to her wounds. She has been tolerating the dressing changes without complication. Fortunately there does not appear to be any signs of active infection at this time which is great news. No fevers, chills, nausea, vomiting, or diarrhea. 01/23/2021 upon evaluation today patient appears to be doing worse in general in regard to her wounds. This is due to the fact that she is not been using her compression pumps which again she knows when she does not it always turns out poorly for her. Unfortunately the issue here today  is that she is continuing to have troubles with her son she seems to be very depressed and I am truly sorry for her and everything she is going through. With that being said I do not know what to do to make things better if she want elevate her legs and use her lymphedema pumps there is really not have anything I can do to correct or fix the situation for her to be honest. I discussed all this with her today. Objective Constitutional Chronically ill appearing but in no apparent acute distress. Vitals Time Taken: 10:23 AM, Height: 62 in, Weight: 335 lbs, BMI: 61.3, Temperature: 97.7 F, Pulse: 91 bpm, Respiratory Rate: 17 breaths/min, Blood Pressure: 137/84 mmHg, Capillary Blood Glucose: 202 mg/dl. Respiratory normal breathing without difficulty. Psychiatric this patient is able to make decisions and demonstrates good insight into disease process. Alert and Oriented x 3. pleasant and cooperative. General Notes: Upon inspection patient has much more weeping in general in regard to her legs and again this is something that I explained  is going to continue to be an issue if she continues to not use her lymphedema pumps, elevate her legs, and keep the compression wraps in place. With that being said she is not elevating her legs infection sleeping in her motorized wheelchair. She also does not using her lymphedema pumps at all because she states that her son is "taken off the space". Overall I do feel sorry for her but I do not know what to do to make this better. Integumentary (Hair, Skin) Wound #61 status is Open. Original cause of wound was Gradually Appeared. The date acquired was: 04/20/2019. The wound has been in treatment 92 weeks. The wound is located on the Left,Circumferential Lower Leg. The wound measures 18cm length x 31cm width x 0.1cm depth; 438.252cm^2 area and 43.825cm^3 volume. There is Fat Layer (Subcutaneous Tissue) exposed. There is no tunneling or undermining noted. There is a  medium amount of serous drainage noted. The wound margin is indistinct and nonvisible. There is small (1-33%) pink, pale granulation within the wound bed. There is a large (67-100%) amount of necrotic tissue within the wound bed including Adherent Slough. Wound #64 status is Open. Original cause of wound was Gradually Appeared. The date acquired was: 06/01/2019. The wound has been in treatment 86 weeks. The wound is located on the Left,Dorsal Foot. The wound measures 7.5cm length x 7cm width x 0.1cm depth; 41.233cm^2 area and 4.123cm^3 volume. There is Fat Layer (Subcutaneous Tissue) exposed. There is no tunneling or undermining noted. There is a medium amount of serous drainage noted. The wound margin is indistinct and nonvisible. There is small (1-33%) pink, pale granulation within the wound bed. There is a large (67-100%) amount of necrotic tissue within the wound bed including Adherent Slough. Wound #67 status is Open. Original cause of wound was Gradually Appeared. The date acquired was: 05/02/2020. The wound has been in treatment 38 weeks. The wound is located on the Right,Medial Lower Leg. The wound measures 15cm length x 10cm width x 0.1cm depth; 117.81cm^2 area and 11.781cm^3 volume. There is Fat Layer (Subcutaneous Tissue) exposed. There is no tunneling or undermining noted. There is a medium amount of serosanguineous drainage noted. The wound margin is indistinct and nonvisible. There is small (1-33%) pink, pale granulation within the wound bed. There is a large (67-100%) amount of necrotic tissue within the wound bed including Adherent Slough. Wound #71 status is Open. Original cause of wound was Gradually Appeared. The date acquired was: 01/23/2021. The wound is located on the Right,Lateral Lower Leg. The wound measures 6cm length x 3.5cm width x 0.1cm depth; 16.493cm^2 area and 1.649cm^3 volume. There is no tunneling or undermining noted. There is a large amount of serosanguineous  drainage noted. The wound margin is distinct with the outline attached to the wound base. There is no granulation within the wound bed. There is a large (67-100%) amount of necrotic tissue within the wound bed including Adherent Slough. 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 There was a Three Layer Compression Therapy Procedure by Deon Pilling, RN. Post procedure Diagnosis Wound #61: Same as Pre-Procedure Wound #67 Pre-procedure diagnosis of Wound #67 is a Diabetic Wound/Ulcer of the Lower Extremity located on the Right,Medial Lower Leg . There was a Three Layer Compression Therapy Procedure by Deon Pilling, RN. Post procedure Diagnosis Wound #67: Same as Pre-Procedure Plan Follow-up Appointments: Return Appointment in 2 weeks. Bathing/ Shower/ Hygiene: May shower and wash wound with soap and water. - with dressing changes, wash both legs with wash cloth and soap and water with dressing changes Other Bathing/Shower/Hygiene Orders/Instructions: - Home health to wash legs with warn soapy water and a clean wash cloth with dressing changes Edema Control - Lymphedema / SCD / Other: Lymphedema Pumps. Use Lymphedema pumps on leg(s) 2-3 times a  day for 45-60 minutes. If wearing any wraps or hose, do not remove them. Continue exercising as instructed. Elevate legs to the level of the heart or above for 30 minutes daily and/or when sitting, a frequency of: - especially at night Avoid standing for long periods of time. Exercise regularly Home Health: No change in wound care orders this week; continue Home Health for wound care. May utilize formulary equivalent dressing for wound treatment orders unless otherwise specified. - add urea cream to legs with dressing changes when available Dressing changes to be completed by [facility] on Monday / Wednesday / Friday except when patient has scheduled visit at [facility]. Other Home Health Orders/Instructions: - [facility] WOUND #61: - Lower Leg Wound Laterality: Left, Circumferential Peri-Wound Care: Triamcinolone 15 (g) 3 x Per Week/30 Days Discharge Instructions: Use triamcinolone 15 (g) thin layer on thick dry skin Peri-Wound Care: Sween Lotion (Moisturizing lotion) (Home Health) 3 x Per Week/30 Days Discharge Instructions: Apply moisturizing lotion to dry skin on legs Prim Dressing: Maxorb Extra Calcium Alginate 2x2 in (Home Health) 3 x Per Week/30 Days ary Discharge Instructions: Apply calcium alginate to wound bed as instructed Secondary Dressing: ABD Pad, 8x10 (Home Health) 3 x Per Week/30 Days Discharge Instructions: Apply over primary dressing as directed. Secondary Dressing: Zetuvit Plus 4x4 in (Home Health) 3 x Per Week/30 Days Discharge Instructions: or equivalent extra absorbent pad.Apply over primary dressing as needed. Com pression Wrap: ThreePress (3 layer compression wrap) (Home Health) 3 x Per Week/30 Days Discharge Instructions: Apply three layer compression as directed. Pad bend of ankle with foam or ABD pad. WOUND #64: - Foot Wound Laterality: Dorsal, Left Peri-Wound Care: Triamcinolone 15 (g) 3 x Per Week/30 Days Discharge Instructions: Use triamcinolone 15 (g)  thin layer on thick dry skin Peri-Wound Care: Sween Lotion (Moisturizing lotion) (Home Health) 3 x Per Week/30 Days Discharge Instructions: Apply moisturizing lotion to dry skin on legs Prim Dressing: Maxorb Extra Calcium Alginate 2x2 in (Home Health) 3 x Per Week/30 Days ary Discharge Instructions: Apply calcium alginate to wound bed as instructed Secondary Dressing: ABD Pad, 8x10 (Home Health) 3 x Per Week/30 Days Discharge Instructions: Apply over primary dressing as directed. Secondary Dressing: Zetuvit Plus 4x4 in (Home Health) 3 x Per Week/30 Days Discharge Instructions: or equivalent extra absorbent pad.Apply over primary dressing as needed. Com pression Wrap: ThreePress (3 layer compression wrap) (Home Health) 3 x Per Week/30 Days Discharge Instructions: Apply three layer compression as directed. Pad bend of ankle with foam or ABD pad. WOUND #67: - Lower Leg Wound Laterality: Right,  Medial Peri-Wound Care: Triamcinolone 15 (g) 3 x Per Week/30 Days Discharge Instructions: Use triamcinolone 15 (g) thin layer on thick dry skin Peri-Wound Care: Sween Lotion (Moisturizing lotion) (Home Health) 3 x Per Week/30 Days Discharge Instructions: Apply moisturizing lotion to dry skin on legs Prim Dressing: Maxorb Extra Calcium Alginate 2x2 in (Home Health) 3 x Per Week/30 Days ary Discharge Instructions: Apply calcium alginate to wound bed as instructed Secondary Dressing: ABD Pad, 8x10 (Home Health) 3 x Per Week/30 Days Discharge Instructions: Apply over primary dressing as directed. Secondary Dressing: Zetuvit Plus 4x4 in (Home Health) 3 x Per Week/30 Days Discharge Instructions: or equivalent extra absorbent pad.Apply over primary dressing as needed. Com pression Wrap: ThreePress (3 layer compression wrap) (Home Health) 3 x Per Week/30 Days Discharge Instructions: Apply three layer compression as directed. Pad bend of ankle with foam or ABD pad. WOUND #71: - Lower Leg Wound Laterality: Right,  Lateral Peri-Wound Care: Triamcinolone 15 (g) 3 x Per Week/30 Days Discharge Instructions: Use triamcinolone 15 (g) thin layer on thick dry skin Peri-Wound Care: Sween Lotion (Moisturizing lotion) (Home Health) 3 x Per Week/30 Days Discharge Instructions: Apply moisturizing lotion to dry skin on legs Prim Dressing: Maxorb Extra Calcium Alginate 2x2 in (Home Health) 3 x Per Week/30 Days ary Discharge Instructions: Apply calcium alginate to wound bed as instructed Secondary Dressing: ABD Pad, 8x10 (Home Health) 3 x Per Week/30 Days Discharge Instructions: Apply over primary dressing as directed. Secondary Dressing: Zetuvit Plus 4x4 in (Home Health) 3 x Per Week/30 Days Discharge Instructions: or equivalent extra absorbent pad.Apply over primary dressing as needed. Com pression Wrap: ThreePress (3 layer compression wrap) (Home Health) 3 x Per Week/30 Days Discharge Instructions: Apply three layer compression as directed. Pad bend of ankle with foam or ABD pad. 1. Would recommend currently that the patient needs to be sleeping in her bed not in her motorized wheelchair. 2. She also needs to be using her pumps at least once a day although twice a day will be better. 3. She needs to continue wear compression wraps. 4. She also needs to continue her fluid pills. If she does all these she knows things will get better and they always do. However if she continues with how she is not conforming to any of the above recommendations this is only get a continue to get worse to be honest. We will see patient back for reevaluation in 2 weeks here in the clinic. If anything worsens or changes patient will contact our office for additional recommendations. Electronic Signature(s) Signed: 01/24/2021 5:47:04 PM By: [name] PA-C Entered By: [name] on 01/24/2021 17:47:03 -------------------------------------------------------------------------------- SuperBill Details Patient Name: Date of  Service: [name] 01/23/2021 Medical Record Number: [id number] Patient Account Number: [id number] Date of Birth/Sex: Treating RN: Jul 02, 1950 (70 y.o. [name] Primary Care Provider: [name] Other Clinician: Referring Provider: Treating Provider/Extender: Darlen Round in Treatment: 181 Diagnosis Coding ICD-10 Codes Code Description E11.622 Type 2 diabetes mellitus with other skin ulcer I89.0 Lymphedema, not elsewhere classified I87.331 Chronic venous hypertension (idiopathic) with ulcer and inflammation of right lower extremity I87.332 Chronic venous hypertension (idiopathic) with ulcer and inflammation of left lower extremity L97.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 leg with fat layer exposed L97.822 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 I10 Essential (primary) hypertension E66.01  Morbid (severe) obesity due to excess calories F41.8 Other specified anxiety disorders R53.1 Weakness Facility Procedures The patient participates with Medicare or their insurance follows the Medicare Facility Guidelines: CPT4 Description Modifier Quantity Code [id number] BILATERAL: Application of multi-layer venous compression system; leg (below knee), including ankle and 1 foot. Physician Procedures : CPT4 Code Description Modifier [id number] - WC PHYS LEVEL 4 - EST PT ICD-10 Diagnosis Description E11.622 Type 2 diabetes mellitus with other skin ulcer I89.0 Lymphedema, not elsewhere classified I87.331 Chronic venous hypertension (idiopathic) with  ulcer and inflammation of right lower extremity I87.332 Chronic venous hypertension (idiopathic) with ulcer and inflammation of left lower extremity Quantity: 1 Electronic Signature(s) Signed: 01/24/2021 5:47:20 PM By: [name] PA-C Previous Signature:  01/23/2021 3:55:35 PM Version By: [name] RN, BSN Entered By: [name] on 01/24/2021 17:47:18

## 2021-01-23 NOTE — Progress Notes (Signed)
[name], [name] ([id number]) Visit Report for 01/23/2021 Arrival Information Details Patient Name: Date of Service: [name], [name] 01/23/2021 10:30 A M Medical Record Number: [id number] Patient Account Number: [id number] Date of Birth/Sex: Treating RN: 02/14/1951 (71 y.o. [name], [name] Primary Care Oumou Smead: [name] Other Clinician: Referring Tully Mcinturff: Treating Sherman Donaldson/Extender: Darlen Round in Treatment: 1 Visit Information History Since Last Visit Added or deleted any medications: No Patient Arrived: Wheel Chair Any new allergies or adverse reactions: No Arrival Time: 10:32 Had a fall or experienced change in No Accompanied By: self activities of daily living that may affect Transfer Assistance: None risk of falls: Patient Identification Verified: Yes Signs or symptoms of abuse/neglect since last visito No Secondary Verification Process Completed: Yes Hospitalized since last visit: No Patient Requires Transmission-Based Precautions: No Implantable device outside of the clinic excluding No Patient Has Alerts: Yes cellular tissue based products placed in the center Patient Alerts: R ABI= 1.01 since last visit: L ABI = .99 Has Dressing in Place as Prescribed: Yes Pain Present Now: Yes Electronic Signature(s) Signed: 01/23/2021 3:40:52 PM By: Rhae Hammock RN Entered By: Rhae Hammock on 01/23/2021 10:32:17 -------------------------------------------------------------------------------- Compression Therapy Details Patient Name: Date of Service: [name]. 01/23/2021 10:30 A M Medical Record Number: [id number] Patient Account Number: [id number] Date of Birth/Sex: Treating RN: 1951/05/07 (70 y.o. [name] Primary Care Morgan Rennert: [name] Other Clinician: Referring Eulalah Rupert: Treating Isiah Scheel/Extender: Darlen Round in Treatment: 181 Compression Therapy Performed for Wound Assessment: Wound #61  Left,Circumferential Lower Leg Performed By: Clinician Deon Pilling, RN Compression Type: Three Layer Post Procedure Diagnosis Same as Pre-procedure Electronic Signature(s) Signed: 01/23/2021 3:55:35 PM By: [name] RN, BSN Entered By: [name] on 01/23/2021 11:37:55 -------------------------------------------------------------------------------- Compression Therapy Details Patient Name: Date of Service: [name]. 01/23/2021 10:30 A M Medical Record Number: [id number] Patient Account Number: [id number] Date of Birth/Sex: Treating RN: 1950-07-04 (70 y.o. [name] Primary Care Danea Manter: [name] Other Clinician: Referring Shaydon Lease: Treating Dillen Belmontes/Extender: Darlen Round in Treatment: 181 Compression Therapy Performed for Wound Assessment: Wound #67 Right,Medial Lower Leg Performed By: Clinician Deon Pilling, RN Compression Type: Three Layer Post Procedure Diagnosis Same as Pre-procedure Electronic Signature(s) Signed: 01/23/2021 3:55:35 PM By: [name] RN, BSN Entered By: [name] on 01/23/2021 11:37:55 -------------------------------------------------------------------------------- Lower Extremity Assessment Details Patient Name: Date of Service: [name]. 01/23/2021 10:30 A M Medical Record Number: [id number] Patient Account Number: [id number] Date of Birth/Sex: Treating RN: 28-Apr-1951 (71 y.o. [name], [name] Primary Care Jennifer Payes: [name] Other Clinician: Referring Calin Ellery: Treating Joseluis Alessio/Extender: Darlen Round in Treatment: 181 Edema Assessment Assessed: [name]: Yes] [name]: Yes] Edema: [Left: Yes] [Right: Yes] Calf Left: Right: Point of Measurement: 33 cm From Medial Instep 40 cm 44 cm Ankle Left: Right: Point of Measurement: 9 cm From Medial Instep 30 cm 26 cm Vascular Assessment Pulses: Dorsalis Pedis Palpable: [Left:Yes] [Right:Yes] Posterior  Tibial Palpable: [Left:Yes] [Right:Yes] Electronic Signature(s) Signed: 01/23/2021 3:40:52 PM By: Rhae Hammock RN Entered By: Rhae Hammock on 01/23/2021 10:38:04 -------------------------------------------------------------------------------- [place] Details Patient Name: Date of Service: [name]. 01/23/2021 10:30 A M Medical Record Number: [id number] Patient Account Number: [id number] Date of Birth/Sex: Treating RN: 1950/10/13 (70 y.o. [name] Primary Care Dorena Dorfman: [name] Other Clinician: Referring Yeraldin Litzenberger: Treating Jazsmine Macari/Extender: Darlen Round in Treatment: [place] reviewed with physician Active Inactive Venous Leg Ulcer Nursing Diagnoses: Actual  venous Insuffiency (use after diagnosis is confirmed) Knowledge deficit related to disease process and management Goals: Patient will maintain optimal edema control Date Initiated: 08/12/2017 Target Resolution Date: 02/13/2021 Goal Status: Active Patient/caregiver will verbalize understanding of disease process and disease management Date Initiated: 08/12/2017 Date Inactivated: 04/21/2018 Target Resolution Date: 04/24/2018 Goal Status: Met Interventions: Assess peripheral edema status every visit. Compression as ordered Treatment Activities: Therapeutic compression applied : 08/12/2017 Notes: Wound/Skin Impairment Nursing Diagnoses: Impaired tissue integrity Knowledge deficit related to ulceration/compromised skin integrity Goals: Patient/caregiver will verbalize understanding of skin care regimen Date Initiated: 08/12/2017 Target Resolution Date: 02/13/2021 Goal Status: Active Ulcer/skin breakdown will have a volume reduction of 30% by week 4 Date Initiated: 08/05/2017 Date Inactivated: 09/30/2017 Target Resolution Date: 10/03/2017 Goal Status: Met Ulcer/skin breakdown will have a volume reduction of 50% by week 8 Date  Initiated: 09/30/2017 Date Inactivated: 10/28/2017 Target Resolution Date: 10/28/2017 Goal Status: Met Interventions: Assess patient/caregiver ability to perform ulcer/skin care regimen upon admission and as needed Assess ulceration(s) every visit Provide education on ulcer and skin care Screen for HBO Treatment Activities: Patient referred to home care : 08/05/2017 Skin care regimen initiated : 08/05/2017 Topical wound management initiated : 08/05/2017 Notes: Electronic Signature(s) Signed: 01/23/2021 3:55:35 PM By: [name] RN, BSN Entered By: [name] on 01/23/2021 11:38:17 -------------------------------------------------------------------------------- Pain Assessment Details Patient Name: Date of Service: [name]. 01/23/2021 10:30 A M Medical Record Number: [id number] Patient Account Number: [id number] Date of Birth/Sex: Treating RN: 1950-07-29 (70 y.o. [name], [name] Primary Care Triston Skare: [name] Other Clinician: Referring Felcia Huebert: Treating Mannix Kroeker/Extender: Darlen Round in Treatment: 181 Active Problems Location of Pain Severity and Description of Pain Patient Has Paino Yes Site Locations Pain Location: Pain in Ulcers With Dressing Change: Yes Duration of the Pain. Constant / Intermittento Constant Rate the pain. Current Pain Level: 8 Worst Pain Level: 10 Least Pain Level: 0 Tolerable Pain Level: 8 Character of Pain Describe the Pain: Aching Pain Management and Medication Current Pain Management: Medication: No Cold Application: No Rest: No Massage: No Activity: No T.E.N.S.: No Heat Application: No Leg drop or elevation: No Is the Current Pain Management Adequate: Adequate How does your wound impact your activities of daily livingo Sleep: No Bathing: No Appetite: No Relationship With Others: No Bladder Continence: No Emotions: No Bowel Continence: No Work: No Toileting: No Drive: No Dressing:  No Hobbies: No Electronic Signature(s) Signed: 01/23/2021 3:40:52 PM By: Rhae Hammock RN Entered By: Rhae Hammock on 01/23/2021 10:33:27 -------------------------------------------------------------------------------- Patient/Caregiver Education Details Patient Name: Date of Service: [name] 8/10/2022andnbsp10:30 A M Medical Record Number: [id number] Patient Account Number: [id number] Date of Birth/Gender: Treating RN: [birth year] (70 y.o. [name] Primary Care Physician: [name] Other Clinician: Referring Physician: Treating Physician/Extender: Darlen Round in Treatment: [phone] Education Assessment Education Provided To: Patient Education Topics Provided Venous: Methods: Explain/Verbal Responses: Reinforcements needed, State content correctly Wound/Skin Impairment: Methods: Explain/Verbal Responses: Reinforcements needed, State content correctly Electronic Signature(s) Signed: 01/23/2021 3:55:35 PM By: [name] RN, BSN Entered By: [name] on 01/23/2021 11:38:43 -------------------------------------------------------------------------------- Wound Assessment Details Patient Name: Date of Service: [name]. 01/23/2021 10:30 A M Medical Record Number: [id number] Patient Account Number: [id number] Date of Birth/Sex: Treating RN: 10/11/1950 (70 y.o. [name], [name] Primary Care Dianca Owensby: [name] Other Clinician: Referring Daymen Hassebrock: Treating Ayomide Purdy/Extender: Darlen Round in Treatment: 181 Wound Status Wound Number: 61 Primary Venous Leg Ulcer Etiology: Wound Location: Left, Circumferential Lower  Leg Wound Open Wounding Event: Gradually Appeared Status: Date Acquired: 04/20/2019 Comorbid Asthma, Hypertension, Peripheral Arterial Disease, Peripheral Weeks Of Treatment: 92 History: Venous Disease, Type II Diabetes, Gout, Osteoarthritis Clustered Wound:  Yes Photos Wound Measurements Length: (cm) 18 Width: (cm) 31 Depth: (cm) 0.1 Area: (cm) 438.252 Volume: (cm) 43.825 % Reduction in Area: -5712.4% % Reduction in Volume: -5712.3% Epithelialization: None Tunneling: No Undermining: No Wound Description Classification: Full Thickness Without Exposed Support Structures Wound Margin: Indistinct, nonvisible Exudate Amount: Medium Exudate Type: Serous Exudate Color: amber Foul Odor After Cleansing: No Slough/Fibrino Yes Wound Bed Granulation Amount: Small (1-33%) Exposed Structure Granulation Quality: Pink, Pale Fascia Exposed: No Necrotic Amount: Large (67-100%) Fat Layer (Subcutaneous Tissue) Exposed: Yes Necrotic Quality: Adherent Slough Tendon Exposed: No Muscle Exposed: No Joint Exposed: No Bone Exposed: No Electronic Signature(s) Signed: 01/23/2021 3:40:52 PM By: Rhae Hammock RN Entered By: Rhae Hammock on 01/23/2021 10:47:25 -------------------------------------------------------------------------------- Wound Assessment Details Patient Name: Date of Service: [name]. 01/23/2021 10:30 A M Medical Record Number: [id number] Patient Account Number: [id number] Date of Birth/Sex: Treating RN: [birth year] (70 y.o. [name], [name] Primary Care Duey Liller: [name] Other Clinician: Referring Rovena Hearld: Treating Samon Dishner/Extender: Darlen Round in Treatment: 181 Wound Status Wound Number: 64 Primary Diabetic Wound/Ulcer of the Lower Extremity Etiology: Wound Location: Left, Dorsal Foot Wound Open Wounding Event: Gradually Appeared Status: Date Acquired: 06/01/2019 Comorbid Asthma, Hypertension, Peripheral Arterial Disease, Peripheral Weeks Of Treatment: 86 History: Venous Disease, Type II Diabetes, Gout, Osteoarthritis Clustered Wound: No Photos Wound Measurements Length: (cm) 7.5 Width: (cm) 7 Depth: (cm) 0.1 Area: (cm) 41.233 Volume: (cm) 4.123 % Reduction in  Area: -24889.7% % Reduction in Volume: -8314.3% Epithelialization: Small (1-33%) Tunneling: No Undermining: No Wound Description Classification: Grade 1 Wound Margin: Indistinct, nonvisible Exudate Amount: Medium Exudate Type: Serous Exudate Color: amber Foul Odor After Cleansing: No Slough/Fibrino Yes Wound Bed Granulation Amount: Small (1-33%) Exposed Structure Granulation Quality: Pink, Pale Fascia Exposed: No Necrotic Amount: Large (67-100%) Fat Layer (Subcutaneous Tissue) Exposed: Yes Necrotic Quality: Adherent Slough Tendon Exposed: No Muscle Exposed: No Joint Exposed: No Bone Exposed: No Electronic Signature(s) Signed: 01/23/2021 3:40:52 PM By: Rhae Hammock RN Entered By: Rhae Hammock on 01/23/2021 10:46:29 -------------------------------------------------------------------------------- Wound Assessment Details Patient Name: Date of Service: [name]. 01/23/2021 10:30 A M Medical Record Number: [id number] Patient Account Number: [id number] Date of Birth/Sex: Treating RN: 18-Dec-1950 (70 y.o. [name], [name] Primary Care Mckinnley Cottier: [name] Other Clinician: Referring Kailan Carmen: Treating Lailie Smead/Extender: Darlen Round in Treatment: 181 Wound Status Wound Number: 67 Primary Diabetic Wound/Ulcer of the Lower Extremity Etiology: Wound Location: Right, Medial Lower Leg Wound Open Wounding Event: Gradually Appeared Status: Date Acquired: 05/02/2020 Comorbid Asthma, Hypertension, Peripheral Arterial Disease, Peripheral Weeks Of Treatment: 38 History: Venous Disease, Type II Diabetes, Gout, Osteoarthritis Clustered Wound: No Photos Wound Measurements Length: (cm) 15 Width: (cm) 10 Depth: (cm) 0.1 Area: (cm) 117.81 Volume: (cm) 11.781 % Reduction in Area: -8231.7% % Reduction in Volume: -8255.3% Epithelialization: Small (1-33%) Tunneling: No Undermining: No Wound Description Classification: Grade 1 Wound  Margin: Indistinct, nonvisible Exudate Amount: Medium Exudate Type: Serosanguineous Exudate Color: red, brown Foul Odor After Cleansing: No Slough/Fibrino Yes Wound Bed Granulation Amount: Small (1-33%) Exposed Structure Granulation Quality: Pink, Pale Fascia Exposed: No Necrotic Amount: Large (67-100%) Fat Layer (Subcutaneous Tissue) Exposed: Yes Necrotic Quality: Adherent Slough Tendon Exposed: No Muscle Exposed: No Joint Exposed: No Bone Exposed: No Electronic Signature(s) Signed: 01/23/2021 3:40:52 PM By: Rhae Hammock RN Entered By:  Rhae Hammock on 01/23/2021 97:18:20 -------------------------------------------------------------------------------- Wound Assessment Details Patient Name: Date of Service: [name], [name] 01/23/2021 10:30 A M Medical Record Number: [id number] Patient Account Number: [id number] Date of Birth/Sex: Treating RN: [birth year] (70 y.o. [name], [name] Primary Care Elzie Sheets: [name] Other Clinician: Referring Alven Alverio: Treating Delissa Silba/Extender: Darlen Round in Treatment: 181 Wound Status Wound Number: 71 Primary Lymphedema Etiology: Wound Location: Right, Lateral Lower Leg Wound Open Wounding Event: Gradually Appeared Status: Date Acquired: 01/23/2021 Comorbid Asthma, Hypertension, Peripheral Arterial Disease, Peripheral Weeks Of Treatment: 0 History: Venous Disease, Type II Diabetes, Gout, Osteoarthritis Clustered Wound: No Photos Wound Measurements Length: (cm) 6 Width: (cm) 3.5 Depth: (cm) 0.1 Area: (cm) 16.493 Volume: (cm) 1.649 % Reduction in Area: % Reduction in Volume: Epithelialization: None Tunneling: No Undermining: No Wound Description Classification: Full Thickness Without Exposed Support Structures Wound Margin: Distinct, outline attached Exudate Amount: Large Exudate Type: Serosanguineous Exudate Color: red, brown Foul Odor After Cleansing: No Slough/Fibrino Yes Wound  Bed Granulation Amount: None Present (0%) Exposed Structure Necrotic Amount: Large (67-100%) Fascia Exposed: No Necrotic Quality: Adherent Slough Fat Layer (Subcutaneous Tissue) Exposed: No Tendon Exposed: No Muscle Exposed: No Joint Exposed: No Bone Exposed: No Electronic Signature(s) Signed: 01/23/2021 3:40:52 PM By: Rhae Hammock RN Entered By: Rhae Hammock on 01/23/2021 10:55:48 -------------------------------------------------------------------------------- [place] Details Patient Name: Date of Service: [name]. 01/23/2021 10:30 A M Medical Record Number: [id number] Patient Account Number: [id number] Date of Birth/Sex: Treating RN: 10-27-1950 (70 y.o. [name], [name] Primary Care Noreen Mackintosh: [name] Other Clinician: Referring Jodie Cavey: Treating Zayvier Caravello/Extender: Darlen Round in Treatment: 181 Vital Signs Time Taken: 10:23 Temperature (F): 97.7 Height (in): 62 Pulse (bpm): 91 Weight (lbs): 335 Respiratory Rate (breaths/min): 17 Body Mass Index (BMI): 61.3 Blood Pressure (mmHg): 137/84 Capillary Blood Glucose (mg/dl): 202 Reference Range: 80 - 120 mg / dl Electronic Signature(s) Signed: 01/23/2021 3:40:52 PM By: Rhae Hammock RN Entered By: Rhae Hammock on 01/23/2021 10:33:03

## 2021-02-05 ENCOUNTER — Ambulatory Visit: Payer: Medicare PPO | Admitting: Endocrinology

## 2021-02-05 ENCOUNTER — Encounter (HOSPITAL_COMMUNITY): Payer: Self-pay | Admitting: Emergency Medicine

## 2021-02-05 ENCOUNTER — Emergency Department (HOSPITAL_COMMUNITY): Payer: Medicare PPO

## 2021-02-05 ENCOUNTER — Inpatient Hospital Stay (HOSPITAL_COMMUNITY)
Admission: EM | Admit: 2021-02-05 | Discharge: 2021-02-08 | DRG: 603 | Disposition: A | Discharge status: Home Health | Payer: Medicare PPO | Attending: Family Medicine | Admitting: Family Medicine

## 2021-02-05 DIAGNOSIS — L03115 Cellulitis of right lower limb: Secondary | ICD-10-CM | POA: Diagnosis present

## 2021-02-05 DIAGNOSIS — Z8261 Family history of arthritis: Secondary | ICD-10-CM

## 2021-02-05 DIAGNOSIS — L089 Local infection of the skin and subcutaneous tissue, unspecified: Secondary | ICD-10-CM | POA: Diagnosis present

## 2021-02-05 DIAGNOSIS — L97519 Non-pressure chronic ulcer of other part of right foot with unspecified severity: Secondary | ICD-10-CM | POA: Diagnosis present

## 2021-02-05 DIAGNOSIS — E11621 Type 2 diabetes mellitus with foot ulcer: Secondary | ICD-10-CM | POA: Diagnosis present

## 2021-02-05 DIAGNOSIS — Z794 Long term (current) use of insulin: Secondary | ICD-10-CM

## 2021-02-05 DIAGNOSIS — I87303 Chronic venous hypertension (idiopathic) without complications of bilateral lower extremity: Secondary | ICD-10-CM | POA: Diagnosis present

## 2021-02-05 DIAGNOSIS — M549 Dorsalgia, unspecified: Secondary | ICD-10-CM | POA: Diagnosis present

## 2021-02-05 DIAGNOSIS — I890 Lymphedema, not elsewhere classified: Secondary | ICD-10-CM | POA: Diagnosis not present

## 2021-02-05 DIAGNOSIS — Z6841 Body Mass Index (BMI) 40.0 and over, adult: Secondary | ICD-10-CM

## 2021-02-05 DIAGNOSIS — Z9114 Patient's other noncompliance with medication regimen: Secondary | ICD-10-CM

## 2021-02-05 DIAGNOSIS — Z66 Do not resuscitate: Secondary | ICD-10-CM | POA: Diagnosis present

## 2021-02-05 DIAGNOSIS — F4320 Adjustment disorder, unspecified: Secondary | ICD-10-CM | POA: Diagnosis present

## 2021-02-05 DIAGNOSIS — K219 Gastro-esophageal reflux disease without esophagitis: Secondary | ICD-10-CM | POA: Diagnosis present

## 2021-02-05 DIAGNOSIS — E1140 Type 2 diabetes mellitus with diabetic neuropathy, unspecified: Secondary | ICD-10-CM | POA: Diagnosis present

## 2021-02-05 DIAGNOSIS — F0390 Unspecified dementia without behavioral disturbance: Secondary | ICD-10-CM | POA: Diagnosis present

## 2021-02-05 DIAGNOSIS — Z90710 Acquired absence of both cervix and uterus: Secondary | ICD-10-CM

## 2021-02-05 DIAGNOSIS — I872 Venous insufficiency (chronic) (peripheral): Secondary | ICD-10-CM | POA: Diagnosis present

## 2021-02-05 DIAGNOSIS — T148XXA Other injury of unspecified body region, initial encounter: Secondary | ICD-10-CM

## 2021-02-05 DIAGNOSIS — Z8673 Personal history of transient ischemic attack (TIA), and cerebral infarction without residual deficits: Secondary | ICD-10-CM

## 2021-02-05 DIAGNOSIS — Z8616 Personal history of COVID-19: Secondary | ICD-10-CM | POA: Diagnosis not present

## 2021-02-05 DIAGNOSIS — F6089 Other specific personality disorders: Secondary | ICD-10-CM | POA: Diagnosis not present

## 2021-02-05 DIAGNOSIS — L03119 Cellulitis of unspecified part of limb: Secondary | ICD-10-CM | POA: Diagnosis not present

## 2021-02-05 DIAGNOSIS — G8929 Other chronic pain: Secondary | ICD-10-CM | POA: Diagnosis present

## 2021-02-05 DIAGNOSIS — Z8249 Family history of ischemic heart disease and other diseases of the circulatory system: Secondary | ICD-10-CM

## 2021-02-05 DIAGNOSIS — F32A Depression, unspecified: Secondary | ICD-10-CM | POA: Diagnosis present

## 2021-02-05 DIAGNOSIS — J45909 Unspecified asthma, uncomplicated: Secondary | ICD-10-CM | POA: Diagnosis present

## 2021-02-05 DIAGNOSIS — I10 Essential (primary) hypertension: Secondary | ICD-10-CM | POA: Diagnosis present

## 2021-02-05 DIAGNOSIS — E782 Mixed hyperlipidemia: Secondary | ICD-10-CM | POA: Diagnosis present

## 2021-02-05 DIAGNOSIS — E1151 Type 2 diabetes mellitus with diabetic peripheral angiopathy without gangrene: Secondary | ICD-10-CM | POA: Diagnosis present

## 2021-02-05 DIAGNOSIS — Z86010 Personal history of colonic polyps: Secondary | ICD-10-CM

## 2021-02-05 DIAGNOSIS — Z823 Family history of stroke: Secondary | ICD-10-CM

## 2021-02-05 DIAGNOSIS — F5101 Primary insomnia: Secondary | ICD-10-CM | POA: Diagnosis not present

## 2021-02-05 DIAGNOSIS — F4321 Adjustment disorder with depressed mood: Secondary | ICD-10-CM | POA: Diagnosis not present

## 2021-02-05 DIAGNOSIS — Z9151 Personal history of suicidal behavior: Secondary | ICD-10-CM

## 2021-02-05 DIAGNOSIS — Z993 Dependence on wheelchair: Secondary | ICD-10-CM | POA: Diagnosis not present

## 2021-02-05 DIAGNOSIS — E872 Acidosis: Secondary | ICD-10-CM | POA: Diagnosis present

## 2021-02-05 DIAGNOSIS — B961 Klebsiella pneumoniae [K. pneumoniae] as the cause of diseases classified elsewhere: Secondary | ICD-10-CM | POA: Diagnosis present

## 2021-02-05 DIAGNOSIS — Z9119 Patient's noncompliance with other medical treatment and regimen: Secondary | ICD-10-CM

## 2021-02-05 DIAGNOSIS — Z825 Family history of asthma and other chronic lower respiratory diseases: Secondary | ICD-10-CM

## 2021-02-05 DIAGNOSIS — L03116 Cellulitis of left lower limb: Principal | ICD-10-CM | POA: Diagnosis present

## 2021-02-05 DIAGNOSIS — I878 Other specified disorders of veins: Secondary | ICD-10-CM | POA: Diagnosis present

## 2021-02-05 DIAGNOSIS — M109 Gout, unspecified: Secondary | ICD-10-CM | POA: Diagnosis present

## 2021-02-05 LAB — CBC WITH DIFFERENTIAL/PLATELET
Abs Immature Granulocytes: 0.03 10*3/uL (ref 0.00–0.07)
Basophils Absolute: 0 10*3/uL (ref 0.0–0.1)
Basophils Relative: 1 %
Eosinophils Absolute: 0.3 10*3/uL (ref 0.0–0.5)
Eosinophils Relative: 4 %
HCT: 39.5 % (ref 36.0–46.0)
Hemoglobin: 12.4 g/dL (ref 12.0–15.0)
Immature Granulocytes: 0 %
Lymphocytes Relative: 21 %
Lymphs Abs: 1.6 10*3/uL (ref 0.7–4.0)
MCH: 28.1 pg (ref 26.0–34.0)
MCHC: 31.4 g/dL (ref 30.0–36.0)
MCV: 89.4 fL (ref 80.0–100.0)
Monocytes Absolute: 0.6 10*3/uL (ref 0.1–1.0)
Monocytes Relative: 8 %
Neutro Abs: 5.1 10*3/uL (ref 1.7–7.7)
Neutrophils Relative %: 66 %
Platelets: 365 10*3/uL (ref 150–400)
RBC: 4.42 MIL/uL (ref 3.87–5.11)
RDW: 15.4 % (ref 11.5–15.5)
WBC: 7.7 10*3/uL (ref 4.0–10.5)
nRBC: 0 % (ref 0.0–0.2)

## 2021-02-05 LAB — COMPREHENSIVE METABOLIC PANEL
ALT: 13 U/L (ref 0–44)
AST: 14 U/L — ABNORMAL LOW (ref 15–41)
Albumin: 3 g/dL — ABNORMAL LOW (ref 3.5–5.0)
Alkaline Phosphatase: 103 U/L (ref 38–126)
Anion gap: 7 (ref 5–15)
BUN: 8 mg/dL (ref 8–23)
CO2: 25 mmol/L (ref 22–32)
Calcium: 9.3 mg/dL (ref 8.9–10.3)
Chloride: 102 mmol/L (ref 98–111)
Creatinine, Ser: 0.8 mg/dL (ref 0.44–1.00)
GFR, Estimated: >60 mL/min (ref >60)
Glucose, Bld: 257 mg/dL — ABNORMAL HIGH (ref 70–99)
Potassium: 3.7 mmol/L (ref 3.5–5.1)
Sodium: 134 mmol/L — ABNORMAL LOW (ref 135–145)
Total Bilirubin: 0.4 mg/dL (ref 0.3–1.2)
Total Protein: 8.6 g/dL — ABNORMAL HIGH (ref 6.5–8.1)

## 2021-02-05 LAB — LACTIC ACID, PLASMA
Lactic Acid, Venous: 1.4 mmol/L (ref 0.5–1.9)
Lactic Acid, Venous: 2 mmol/L (ref 0.5–1.9)

## 2021-02-05 LAB — RESP PANEL BY RT-PCR (FLU A&B, COVID) ARPGX2
Influenza A by PCR: NEGATIVE
Influenza B by PCR: NEGATIVE
SARS Coronavirus 2 by RT PCR: NEGATIVE

## 2021-02-05 MED ORDER — FENTANYL CITRATE PF 50 MCG/ML IJ SOSY
50.0000 ug | PREFILLED_SYRINGE | Freq: Once | INTRAMUSCULAR | Status: AC
  Administered 2021-02-05: 50 ug via INTRAVENOUS
  Filled 2021-02-05: qty 1

## 2021-02-05 MED ORDER — ACETAMINOPHEN 325 MG PO TABS: 650.0000 mg | ORAL_TABLET | Freq: Four times a day (QID) | ORAL | Status: DC | PRN

## 2021-02-05 MED ORDER — ATENOLOL 25 MG PO TABS
25.0000 mg | ORAL_TABLET | Freq: Every evening | ORAL | Status: DC
  Administered 2021-02-06 – 2021-02-07 (×2): 25 mg via ORAL
  Filled 2021-02-05 (×3): qty 1

## 2021-02-05 MED ORDER — ALBUTEROL SULFATE (2.5 MG/3ML) 0.083% IN NEBU: 2.5000 mg | INHALATION_SOLUTION | RESPIRATORY_TRACT | Status: DC | PRN

## 2021-02-05 MED ORDER — ALLOPURINOL 300 MG PO TABS
300.0000 mg | ORAL_TABLET | Freq: Every day | ORAL | Status: DC
  Administered 2021-02-06 – 2021-02-08 (×3): 300 mg via ORAL
  Filled 2021-02-05 (×3): qty 1

## 2021-02-05 MED ORDER — INSULIN GLARGINE-YFGN 100 UNIT/ML ~~LOC~~ SOLN
35.0000 [IU] | Freq: Every day | SUBCUTANEOUS | Status: DC
  Administered 2021-02-06 – 2021-02-08 (×3): 35 [IU] via SUBCUTANEOUS
  Filled 2021-02-05 (×5): qty 0.35

## 2021-02-05 MED ORDER — LACTATED RINGERS IV BOLUS
500.0000 mL | Freq: Once | INTRAVENOUS | Status: AC
  Administered 2021-02-05: 500 mL via INTRAVENOUS

## 2021-02-05 MED ORDER — ACETAMINOPHEN 650 MG RE SUPP: 650.0000 mg | Freq: Four times a day (QID) | RECTAL | Status: DC | PRN

## 2021-02-05 MED ORDER — PIPERACILLIN-TAZOBACTAM 3.375 G IVPB 30 MIN
3.3750 g | Freq: Once | INTRAVENOUS | Status: AC
  Administered 2021-02-05: 3.375 g via INTRAVENOUS
  Filled 2021-02-05: qty 50

## 2021-02-05 MED ORDER — HEPARIN SODIUM (PORCINE) 5000 UNIT/ML IJ SOLN
5000.0000 [IU] | Freq: Three times a day (TID) | INTRAMUSCULAR | Status: DC
  Administered 2021-02-06 – 2021-02-07 (×5): 5000 [IU] via SUBCUTANEOUS
  Filled 2021-02-05 (×5): qty 1

## 2021-02-05 MED ORDER — FUROSEMIDE 40 MG PO TABS
40.0000 mg | ORAL_TABLET | Freq: Every day | ORAL | Status: DC
  Administered 2021-02-06 – 2021-02-08 (×3): 40 mg via ORAL
  Filled 2021-02-05 (×3): qty 1

## 2021-02-05 MED ORDER — HYDROCODONE-ACETAMINOPHEN 7.5-325 MG PO TABS
1.0000 | ORAL_TABLET | Freq: Four times a day (QID) | ORAL | Status: DC | PRN
  Administered 2021-02-06 – 2021-02-08 (×4): 1 via ORAL
  Filled 2021-02-05 (×5): qty 1

## 2021-02-05 MED ORDER — PIPERACILLIN-TAZOBACTAM 3.375 G IVPB
3.3750 g | Freq: Three times a day (TID) | INTRAVENOUS | Status: DC
  Administered 2021-02-06: 3.375 g via INTRAVENOUS
  Filled 2021-02-05: qty 50

## 2021-02-05 MED ORDER — ATENOLOL 25 MG PO TABS
50.0000 mg | ORAL_TABLET | Freq: Every morning | ORAL | Status: DC
  Administered 2021-02-06 – 2021-02-08 (×3): 50 mg via ORAL
  Filled 2021-02-05 (×3): qty 2

## 2021-02-05 MED ORDER — NIFEDIPINE ER OSMOTIC RELEASE 90 MG PO TB24
90.0000 mg | ORAL_TABLET | Freq: Every day | ORAL | Status: DC
  Administered 2021-02-06 – 2021-02-07 (×2): 90 mg via ORAL
  Filled 2021-02-05 (×2): qty 1

## 2021-02-05 MED ORDER — VANCOMYCIN HCL 1750 MG/350ML IV SOLN
1750.0000 mg | INTRAVENOUS | Status: DC
  Filled 2021-02-05: qty 350

## 2021-02-05 MED ORDER — GABAPENTIN 300 MG PO CAPS
600.0000 mg | ORAL_CAPSULE | Freq: Two times a day (BID) | ORAL | Status: DC
  Administered 2021-02-06 – 2021-02-08 (×6): 600 mg via ORAL
  Filled 2021-02-05 (×6): qty 2

## 2021-02-05 MED ORDER — POLYETHYLENE GLYCOL 3350 17 G PO PACK: 17.0000 g | PACK | Freq: Every day | ORAL | Status: DC | PRN

## 2021-02-05 MED ORDER — VANCOMYCIN HCL 2000 MG/400ML IV SOLN
2000.0000 mg | Freq: Once | INTRAVENOUS | Status: AC
  Administered 2021-02-05: 2000 mg via INTRAVENOUS
  Filled 2021-02-05: qty 400

## 2021-02-05 MED ORDER — INSULIN ASPART 100 UNIT/ML IJ SOLN
0.0000 [IU] | Freq: Three times a day (TID) | INTRAMUSCULAR | Status: DC
  Administered 2021-02-06: 7 [IU] via SUBCUTANEOUS
  Administered 2021-02-06 (×2): 5 [IU] via SUBCUTANEOUS
  Administered 2021-02-07: 3 [IU] via SUBCUTANEOUS

## 2021-02-05 NOTE — H&P (Addendum)
[facility] Admission History and Physical Service Pager: [phone]  Patient name: CAMBRY SPAMPINATO Medical record number: [id number] Date of birth: March 28, 1951 Age: 70 y.o. Gender: female  Primary Care Provider: Chaney Malling, [place] Consultants: wound care Code Status: DNR  Preferred Emergency Contact:  Contact Information     Name Relation Home Work Mobile   Knauer,Ezekiel Spouse [phone]  [phone]        Chief Complaint: Chronic BLE wound with infection   Assessment and Plan: [name] ERTLE is a 70 y.o. female presenting with worsening of chronic bilateral wound infections. PMH is significant for uncontrolled DM2 insulin dependent, lymphedema, bilateral chronic venous hypertension, HTN, wheelchair-bound, GERD, Dementia, Depression   Chronic BLE wound infection likely cellulitis  Nazareth Norenberg is a 71 yr old patient who presents today for bilateral chronic wounds have worsened over the last week. Pt failed outpatient abx with Keflex, pt is unsure if she completed the course. Pt CBC was unremarkable with WBC 7.7 and has remained afebrile. Lactic acid 1.4>2.0>1.5, on CMP glucose was 257, electrolytes wnl. Pt is non septic, no leukocytosis and afebrile which is reassuring. ABI performed on 12/15/2018: Right: Resting right ankle-brachial index is within normal range. No evidence of significant right lower extremity arterial disease. The right toe-brachial index is normal. RT great toe pressure = 121 mmHg. Left: Resting left ankle-brachial index is within normal range. No evidence of significant left lower extremity arterial disease. The left toe-brachial index is normal. LT Great toe pressure = 124 mmHg. Xrays were obtained of bilateral feet. Xray of the left foot showed no radiographic evidence of osseous destruction in the foot, no soft tissue gas. Right foot xray showed suggestion of ulceration about the heel. No sign of frank bony destruction or acute bony  abnormality. Soft tissue swelling in a generalized fashion throughout the foot, perhaps more pronounced about the forefoot on the lateral view. correlate with any signs of soft tissue infection or frank ulceration. MRI feet pending for osteomyelitis rule out. Pt has several BLE cellulitis infections similar to this in the past requiring hospital admission. No hx of osteomyelitis. ED has ordered blood cultures, CRP, Sed rate that are currently pending. ED also ordered zosyn and vanc with fentanyl 50 mcg IV for pain and an LR bolus.  -Admit to Med[facility]urg [facility], Attending Dr Gwendlyn Deutscher  -Vitals per floor routine -Up with assistance -Carb modified diet  -Continue Vanc and Zosyn per pharmacy -Follow-up bilateral MRI bilateral foot for possible OM -Norco home dose for pain-1 tablet Q6hPRN -Follow-up ESR and CRP -Follow-up blood cultures -Wound care consult  -Ortho consult based on MRI results  -PT/OT consult  -Heparin for DVT ppx  -CBC, CMP am   DM2-Insulin Dependent Last HA1C performed on 12/03/20 was 7.0. Home meds: Lantus 70 units each morning -Start Lantus 35U daily, increase as necessary  -sSSI -Regular CBGs 4x daily with meals and bedtime   Gout Last gout flare was over a month ago.  Home meds: Allopurinol 300 mg daily -Continue home medication  Chronic back pain, neuropathy Home meds: Gabapentin 100 mg twice daily, norco 1 tablet Q6PRN  -Continue norco 1 tablet Q6PRN and Gabapentin 134m 2xdaily   Hyperlipidemia Last lipid panel on 06/22/19: HDL 39, LDL 48, TG 64, total cholesterol 100. Pt is not currently on a statin  -Consider starting statin in hospital due to hx of T2Dm (atorvastatin 442m  Hypertension BP on admission 142/74 Current BP Home meds atenolol 503mm, 41m48mening, nifedipine  64m daily, Lasix 421mdaily -Continuing home medications as above  Asthma Home meds: Albuterol 2.5m56m4H PRN neb -Continue home albuterol   Depression  Patient has a flat affect but is not  taking her home dose of Lexapro. Pt did endorse depressive mood.   Home meds: Lexapro 36m93mily  -Currently have not started home lexapro because the pt denied taking it. However, could consider restarting if needed.   Possible Dementia  Diagnosed by neurologist earlier this year. Pt has trouble with self medicating and does not receive help from anyone. She is AxO4, but she is forgetful and does not have a good memory. She reports that she takes medication for her dementia but does not know which one. Chart search for information about this has been difficult. CT Head wo contrast performed on 03/15/21 showed: This is a normal age-appropriate CT scan of the head. Brain volume was normal for age. There was mild hypodense changes in the hemispheres consistent with mild chronic microvascular ischemic change, typical for age. There were no acute findings. -Delirium precautions  -PT/OT evaluation-would benefit from SNF/long term placement    FEN/GI: Carb modified Prophylaxis: Heparin  Disposition: MedSurg  History of Present Illness:  EuniMARINNA BLANEa 69 y41. female presenting with hx chronic BLE wounds with infection and failed outpatient antibiotics. PMH significant for PMH is significant for uncontrolled DM2 insulin dependent, lymphedema, bilateral chronic venous hypertension, HTN, wheelchair-bound, depression, dementia, asthma, gout, HLD. Pt is poor historian. Pt has been followed by wound care and was put on Keflex on 8/15, but is unsure how much she has taken.   Her BLE have been dark for several years. Per pt, her current wound flare up started last week, and it began as a burning sensation on the back of the right leg.  She also endorses swelling. The left leg is not bothering her as much as the right leg. She has also noticed it has been foul smelling. The left leg was draining more fluid that was possibly yellow. Pt reports she has trouble keeping up with her medications.  She reports this  is the first infection this year. She has had multiple leg infections the past.  Took hydrocodone yesterday which helped a littlel for the right leg pain.   Pt was sent here by her PCP who works at [facility]. Lives at home with husband. Denies smoking, EtOH, or illicit drug use. Has a wound care RN to change dressings Mondays and Fridays. Wheel chair bound for last 15 years.   Has not taken medications today.   [facility] Per medical record review,  Pt saw a physician on 8/15 at [facility] and had a new lesion on the back of the right leg. At that time, she didn't have fevers or chills.  Review Of Systems: Per HPI with the following additions:   Review of Systems  Constitutional:  Negative for appetite change, chills and fever.  Respiratory:  Negative for cough, shortness of breath and wheezing.   Cardiovascular:  Positive for leg swelling. Negative for chest pain.  Gastrointestinal:  Negative for diarrhea, nausea and vomiting.  Genitourinary:  Negative for dysuria, flank pain, frequency and hematuria.    Patient Active Problem List   Diagnosis Date Noted   Wound infection 02/05/2021   Venous stasis dermatitis of both lower extremities    Pneumonia due to COVID-19 virus 06/29/2019  Hypophosphatemia 06/24/2019   Morbidly obese ([place]) 06/23/2019   Diabetes mellitus type 2, uncontrolled, with complications ([place]) 97/58/8325   Anxiety 06/22/2019   Depressive disorder 06/22/2019   Essential hypertension 06/22/2019   HLD (hyperlipidemia) 06/22/2019   COVID-19 06/21/2019   Severe protein-calorie malnutrition ([place]) 06/21/2019   Acute respiratory failure with hypoxia ([place]) 06/21/2019   Diabetic foot ulcer associated with type 2 diabetes mellitus ([place])    Cellulitis 07/05/2016   Depression with anxiety 05/30/2016   Chronic skin ulcer of lower leg ([place]) 05/30/2016   Generalized weakness 05/30/2016   Left arm  weakness 05/30/2016   Left Lower extremity pain  04/02/2016   Lower extremity pain, inferior, left 04/02/2016   Left leg pain    Personal history of noncompliance with medical treatment, presenting hazards to health 03/31/2016   Jaw pain 03/27/2016   Left leg cellulitis 03/24/2016   Cellulitis of left lower extremity    Primary insomnia    Adjustment disorder    Morbid obesity ([place]) 01/27/2016   Wheelchair dependent 01/27/2016   Gout 01/27/2016   Asthma 01/27/2016   Arthritis 01/27/2016   Uncontrolled diabetes mellitus type 2 with peripheral artery disease ([place]) 01/25/2016   Cellulitis of left leg 01/11/2016   Insulin-requiring or dependent type II diabetes mellitus ([place]) 01/11/2016   Hypertension 01/11/2016    Past Medical History: Past Medical History:  Diagnosis Date   Anxiety    Arthritis    "back, arms, legs" (03/24/2016)   Asthma    Chronic lower back pain    Colonic polyp    last colonoscopy done in 2009 with normal results per medical record   Depressive disorder    Gastric polyp    Gout    has taken allopurinol 398m once daily in past   Headache    History of hiatal hernia    Hypertension    Migraine    "none in awhile; might have a couple/year" (03/24/2016)   Mixed hyperlipidemia    01/2016 Total chol 141, HDL 59, LDL 63, ration 1.1   Osteoarthritis    TIA (transient ischemic attack) 11/2014   Type II diabetes mellitus ([place]     Past Surgical History: Past Surgical History:  Procedure Laterality Date   ARTERY BIOPSY Right 08/12/2016   Procedure: BIOPSY TEMPORAL ARTERY;  Surgeon: CElam [name] MD;  [facility]  Service: Vascular;  Laterality: Right;  BIOPSY TEMPORAL ARTERY   BREAST BIOPSY Left ~ 2015   benign   CARPAL TUNNEL RELEASE Bilateral    DILATION AND CURETTAGE OF UTERUS     KNEE [place]ROS[place] 2003   [place]   Social History: Social History    Tobacco Use   Smoking status: Never   Smokeless tobacco: Never  Vaping Use   Vaping Use: Never used  Substance Use Topics   Alcohol use: No   Drug use: No   Additional social history: See HPI  Please also refer to relevant sections of EMR.  Family History: Family History  Problem Relation Age of Onset   Stroke Father    Stroke Brother    Cancer Other    Gout Mother    Hypertension Mother    Arthritis Mother    Asthma Mother    Stroke Brother     Allergies and Medications: Allergies  Allergen Reactions   Ace Inhibitors Swelling   Tizanidine  Metformin And Related Other (See Comments)    CHILLS   Propofol Itching   No current facility-administered medications on file prior to encounter.   Current Outpatient Medications on File Prior to Encounter  Medication Sig Dispense Refill   ACCU-CHEK FASTCLIX LANCETS MISC 1 each. Check blood sugar once daily     acetaminophen (TYLENOL) 500 MG tablet Take 500 mg by mouth daily as needed for moderate pain.     albuterol (PROVENTIL HFA;VENTOLIN HFA) 108 (90 Base) MCG/ACT inhaler Inhale 2 puffs into the lungs every 4 (four) hours as needed for wheezing or shortness of breath.      allopurinol (ZYLOPRIM) 300 MG tablet Take 300 mg by mouth daily.     aspirin 325 MG tablet Take 325 mg by mouth daily.     atenolol (TENORMIN) 25 MG tablet Take 25-50 mg by mouth See admin instructions. Take 50 mg every morning and then 25 mg every evening     Blood Glucose Monitoring Suppl (ACCU-CHEK NANO SMARTVIEW) w/Device KIT 1 each See admin instructions.  0   cephALEXin (KEFLEX) 500 MG capsule Take 500 mg by mouth See admin instructions. Bid x 10 days     cyclobenzaprine (FLEXERIL) 5 MG tablet Take 5 mg by mouth 3 (three) times daily as needed for muscle spasms.     furosemide (LASIX) 20 MG tablet Take 1 tablet (20 mg total) by mouth daily. (Patient taking differently: Take 40 mg by mouth daily.) 30 tablet 0   gabapentin (NEURONTIN) 300 MG capsule  Take 1 capsule (300 mg total) by mouth 2 (two) times daily. (Patient taking differently: Take 600 mg by mouth 2 (two) times daily.) 60 capsule 2   hydrocerin (EUCERIN) CREA Apply 1 application topically daily. (Patient taking differently: Apply 1 application topically daily as needed (dry skin).) 228 g 0   HYDROcodone-acetaminophen (NORCO) 7.5-325 MG tablet Take 1 tablet by mouth every 6 (six) hours as needed for moderate pain.     insulin glargine (LANTUS) 100 UNIT/ML Solostar Pen Inject 50 Units into the skin every morning. And pen needles 1/day (Patient taking differently: Inject 70 Units into the skin every morning. And pen needles 1/day) 45 mL 3   NIFEdipine (PROCARDIA XL/ADALAT-CC) 90 MG 24 hr tablet Take 90 mg by mouth daily.     NOVOFINE 32G X 6 MM MISC 1 each as needed.     polyethylene glycol (MIRALAX / GLYCOLAX) packet Take 17 g by mouth daily as needed for moderate constipation.     potassium chloride (K-DUR) 10 MEQ tablet Take 1 tablet (10 mEq total) by mouth daily. 30 tablet 0   protein supplement shake (PREMIER PROTEIN) LIQD Take 325 mLs (11 oz total) by mouth daily.  0   ACCU-CHEK SMARTVIEW test strip 1 each 4 (four) times daily. for testing  0   diclofenac sodium (VOLTAREN) 1 % GEL Apply 4 g topically 4 (four) times daily. (Patient not taking: Reported on 02/05/2021) 1 Tube 1   Diclofenac Sodium CR (VOLTAREN-XR) 100 MG 24 hr tablet Take 1 tablet (100 mg total) by mouth daily. (Patient not taking: Reported on 02/05/2021) 10 tablet 0   escitalopram (LEXAPRO) 10 MG tablet Take 1 tablet (10 mg total) by mouth daily. (Patient not taking: No sig reported) 30 tablet 11    Objective: BP 134/85   Pulse 74   Temp 98.4 F (36.9 C) (Oral)   Resp 18   Wt 131.4 kg   SpO2 94%   BMI 48.21 kg/m  Physical Exam Vitals reviewed.  Constitutional:      General: She is not in acute distress.    Appearance: She is obese. She is not ill-appearing.  HENT:     Head: Normocephalic and atraumatic.   Cardiovascular:     Rate and Rhythm: Normal rate and regular rhythm.     Pulses: Normal pulses.  Pulmonary:     Effort: Pulmonary effort is normal. No respiratory distress.     Breath sounds: Normal breath sounds.  Abdominal:     General: Bowel sounds are normal. There is no distension.     Palpations: Abdomen is soft.     Tenderness: There is no abdominal tenderness.  Skin:    Comments: BLE wrapped, with yellow drainage breakthrough on the left leg wrap. Picture of BLE shown below   Neurological:     Mental Status: She is alert and oriented to person, place, and time.  Psychiatric:     Comments: Flat affect        Labs and Imaging: CBC BMET  Recent Labs  Lab 02/05/21 1500  WBC 7.7  HGB 12.4  HCT 39.5  PLT 365   Recent Labs  Lab 02/05/21 1500  NA 134*  K 3.7  CL 102  CO2 25  BUN 8  CREATININE 0.80  GLUCOSE 257*  CALCIUM 9.3     Left foot xray:  IMPRESSION: 1. No radiographic evidence of osseous destruction in the foot, no definite soft tissue gas. 2. Diffuse soft tissue swelling about the foot. 3. MRI may be considered for increased sensitivity for detection of osteomyelitis.  Right foot xray:  IMPRESSION: 1. Suggestion of ulceration about the heel. No sign of frank bony destruction or acute bony abnormality. 2. Soft tissue swelling in a generalized fashion throughout the foot, perhaps more pronounced about the forefoot on the lateral view. correlate with any signs of soft tissue infection or frank ulceration   Erskine Emery, MD 02/06/2021, 12:03 AM PGY-1, [facility] Intern pager: [phone], text pages welcome  FPTS Upper-Level Resident Addendum   I have independently interviewed and examined the patient. I have discussed the above with the original author and agree with their documentation. My edits for correction/addition/clarification are in black. Please see also any attending notes.   Lattie Haw MD PGY-3, [facility] 02/06/2021 2:48 AM  FPTS Service pager: [phone] (text pages welcome through [facility])

## 2021-02-05 NOTE — ED Notes (Signed)
Lactic acid 2.0 MD made aware.  

## 2021-02-05 NOTE — ED Provider Notes (Signed)
[facility] EMERGENCY DEPARTMENT Provider Note   CSN: [id number] Arrival date & time: 02/05/21  1316     History No chief complaint on file.   [name] is a 70 y.o. female.  HPI Patient presents for concern of leg infection.  She has had ongoing stasis wounds in her bilateral lower extremities.  She has been on courses of antibiotics and is currently on approximately day 10 of a course of Keflex.  She reports that, despite antibiotics, she has had increased drainage from her left leg and increased pain in her right leg.  Patient lives at home with her husband.  She has a home health care nurse that changes her dressing on Mondays and Fridays.  She also has a wound care team that comes to the house once a week.  Patient did not get her dressing changed yesterday because she was scheduled to have a doctor's appointment.  She states that she was not able to go to her doctors yesterday.  She states that she did go to her primary care doctor this morning who directed her to come to the emergency department.  Last dressing change was Friday.  Patient has been taking Norco for pain.  Her last Norco dose was 10 AM.  Currently she endorses 8/10 severity pain that is worse in her RLE than her LLE.  She denies any recent fevers or chills.  Past Medical History:  Diagnosis Date   Anxiety    Arthritis    "back, arms, legs" (03/24/2016)   Asthma    Chronic lower back pain    Colonic polyp    last colonoscopy done in 2009 with normal results per medical record   Depressive disorder    Gastric polyp    Gout    has taken allopurinol 362m once daily in past   Headache    History of hiatal hernia    Hypertension    Migraine    "none in awhile; might have a couple/year" (03/24/2016)   Mixed hyperlipidemia    01/2016 Total chol 141, HDL 59, LDL 63, ration 1.1   Osteoarthritis    TIA (transient ischemic attack) 11/2014   Type II diabetes mellitus ([facility]     Patient Active  Problem List   Diagnosis Date Noted   Wound infection 02/05/2021   Venous stasis dermatitis of both lower extremities    Pneumonia due to COVID-19 virus 06/29/2019   Hypophosphatemia 06/24/2019   Morbidly obese ([facility] 06/23/2019   Diabetes mellitus type 2, uncontrolled, with complications (H[facility] [id number]  Anxiety 06/22/2019   Depressive disorder 06/22/2019   Essential hypertension 06/22/2019   HLD (hyperlipidemia) 06/22/2019   COVID-19 06/21/2019   Severe protein-calorie malnutrition ([facility] 06/21/2019   Acute respiratory failure with hypoxia ([facility] 06/21/2019   Diabetic foot ulcer associated with type 2 diabetes mellitus ([facility]    Cellulitis 07/05/2016   Depression with anxiety 05/30/2016   Chronic skin ulcer of lower leg ([place] 05/30/2016   Generalized weakness 05/30/2016   Left arm weakness 05/30/2016   Left Lower extremity pain  04/02/2016   Lower extremity pain, inferior, left 04/02/2016   Left leg pain    Personal history of noncompliance with medical treatment, presenting hazards to health 03/31/2016   Jaw pain 03/27/2016   Left leg cellulitis 03/24/2016   Cellulitis of left lower extremity    Primary insomnia    Adjustment disorder    Morbid obesity ([place] 01/27/2016   Wheelchair dependent 01/27/2016  Gout 01/27/2016   Asthma 01/27/2016   Arthritis 01/27/2016   Uncontrolled diabetes mellitus type 2 with peripheral artery disease ([place]) 01/25/2016   Cellulitis of left leg 01/11/2016   Insulin-requiring or dependent type II diabetes mellitus ([place]) 01/11/2016   Hypertension 01/11/2016    Past Surgical History:  Procedure Laterality Date   ARTERY BIOPSY Right 08/12/2016   Procedure: BIOPSY TEMPORAL ARTERY;  Surgeon: [name], MD;  Location: [facility];  Service: Vascular;  Laterality: Right;  BIOPSY TEMPORAL ARTERY   BREAST BIOPSY Left ~ 2015   benign   CARPAL TUNNEL RELEASE Bilateral    DILATION AND CURETTAGE OF UTERUS     KNEE ARTHROSCOPY Right 2003   in [place]     OB History   No obstetric history on file.     Family History  Problem Relation Age of Onset   Stroke Father    Stroke Brother    Cancer Other    Gout Mother    Hypertension Mother    Arthritis Mother    Asthma Mother    Stroke Brother     Social History   Tobacco Use   Smoking status: Never   Smokeless tobacco: Never  Vaping Use   Vaping Use: Never used  Substance Use Topics   Alcohol use: No   Drug use: No    Home Medications Prior to Admission medications   Medication Sig Start Date End Date Taking? Authorizing Provider  ACCU-CHEK FASTCLIX LANCETS MISC 1 each. Check blood sugar once daily 03/20/17  Yes [provider]  acetaminophen (TYLENOL) 500 MG tablet Take 500 mg by mouth daily as needed for moderate pain.   Yes [provider]  albuterol (PROVENTIL HFA;VENTOLIN HFA) 108 (90 Base) MCG/ACT inhaler Inhale 2 puffs into the lungs every 4 (four) hours as needed for wheezing or shortness of breath.    Yes [provider]  allopurinol (ZYLOPRIM) 300 MG tablet Take 300 mg by mouth daily.   Yes [provider]  aspirin 325 MG tablet Take 325 mg by mouth daily.   Yes [provider]  atenolol (TENORMIN) 25 MG tablet Take 25-50 mg by mouth See admin instructions. Take 50 mg every morning and then 25 mg every evening   Yes [provider]  Blood Glucose Monitoring Suppl (ACCU-CHEK NANO SMARTVIEW) w/Device KIT 1 each See admin instructions. 05/23/17  Yes [provider]  cephALEXin (KEFLEX) 500 MG capsule Take 500 mg by mouth See admin instructions. Bid x 10 days 01/28/21  Yes [provider]  cyclobenzaprine (FLEXERIL) 5 MG tablet Take 5 mg by mouth 3 (three) times daily as needed for muscle spasms. 10/28/19  Yes [provider]  furosemide (LASIX) 20 MG tablet Take 1 tablet (20 mg total) by mouth daily. Patient  taking differently: Take 40 mg by mouth daily. 07/07/16  Yes Thurnell Lose, MD  gabapentin (NEURONTIN) 300 MG capsule Take 1 capsule (300 mg total) by mouth 2 (two) times daily. Patient taking differently: Take 600 mg by mouth 2 (two) times daily. 01/31/16  Yes Henson, [name] L, NP-C  hydrocerin (EUCERIN) CREA Apply 1 application topically daily. Patient taking differently: Apply 1 application topically daily as needed (dry skin). 01/15/16  Yes Thurnell Lose, MD  HYDROcodone-acetaminophen (NORCO) 7.5-325 MG tablet Take 1 tablet by mouth every 6 (six) hours as needed for moderate pain. 01/28/21  Yes [provider]  insulin glargine (LANTUS) 100 UNIT/ML Solostar Pen Inject 50 Units into the skin every morning. And pen needles 1/day Patient taking differently: Inject 70 Units into the skin every morning. And pen needles 1/day 12/03/20  Yes Renato Shin, MD  NIFEdipine (PROCARDIA XL/ADALAT-CC) 90 MG 24 hr tablet Take 90 mg by mouth daily.   Yes [provider]  NOVOFINE 32G X 6 MM MISC 1 each as needed. 08/09/17  Yes [provider]  polyethylene glycol (MIRALAX / GLYCOLAX) packet Take 17 g by mouth daily as needed for moderate constipation.   Yes [provider]  potassium chloride (K-DUR) 10 MEQ tablet Take 1 tablet (10 mEq total) by mouth daily. 07/07/16  Yes Thurnell Lose, MD  protein supplement shake (PREMIER PROTEIN) LIQD Take 325 mLs (11 oz total) by mouth daily. 03/18/17  Yes Eugenie Filler, MD  ACCU-CHEK SMARTVIEW test strip 1 each 4 (four) times daily. for testing 06/15/17   [provider]  diclofenac sodium (VOLTAREN) 1 % GEL Apply 4 g topically 4 (four) times daily. Patient not taking: Reported on 02/05/2021 02/25/16   Harland Dingwall L, NP-C  Diclofenac Sodium CR (VOLTAREN-XR) 100 MG 24 hr tablet Take 1 tablet (100 mg total) by mouth daily. Patient not taking: Reported on 02/05/2021 09/09/17   Palumbo, April, MD  escitalopram (LEXAPRO) 10  MG tablet Take 1 tablet (10 mg total) by mouth daily. Patient not taking: No sig reported 02/27/20   Britt Bottom, MD    Allergies    Ace inhibitors, Tizanidine, Metformin and related, and Propofol  Review of Systems   Review of Systems  Constitutional:  Negative for appetite change, chills, fatigue and fever.  HENT:  Negative for ear pain and sore throat.   Eyes:  Negative for pain and visual disturbance.  Respiratory:  Negative for cough and shortness of breath.   Cardiovascular:  Negative for chest pain and palpitations.  Gastrointestinal:  Negative for abdominal pain, diarrhea, nausea and vomiting.  Genitourinary:  Negative for dysuria and hematuria.  Musculoskeletal:  Negative for arthralgias and back pain.  Skin:  Positive for wound. Negative for color change and rash.  Neurological:  Negative for dizziness, seizures, syncope, light-headedness, numbness and headaches.  All other systems reviewed and are negative.  Physical Exam Updated Vital Signs BP 134/85   Pulse 74   Temp 98.4 F (36.9 C) (Oral)   Resp 18   Wt 131.4 kg   SpO2 94%   BMI 48.21 kg/m   Physical Exam Vitals and nursing note reviewed.  Constitutional:      General: She is not in acute distress.    Appearance: She is well-developed. She is obese. She is not toxic-appearing or diaphoretic.  HENT:     Head: Normocephalic and atraumatic.     Right Ear: External ear normal.     Left Ear: External ear normal.     Nose: Nose normal.  Eyes:     Extraocular Movements: Extraocular movements intact.     Conjunctiva/sclera: Conjunctivae normal.  Cardiovascular:     Rate and Rhythm: Normal rate and regular rhythm.     Heart sounds: No murmur heard. Pulmonary:     Effort: Pulmonary effort is normal. No respiratory distress.     Breath sounds: Normal breath sounds. No wheezing or rales.  Abdominal:     Palpations: Abdomen is soft.     Tenderness: There is no abdominal tenderness.  Musculoskeletal:  General: Tenderness present.     Cervical back: Neck supple.     Right lower leg: Edema present.     Left lower leg: Edema present.  Skin:    General: Skin is warm and dry.     Comments: Chronic purulent wounds to distal lower extremities.  Neurological:     General: No focal deficit present.     Mental Status: She is alert and oriented to person, place, and time.     Cranial Nerves: No cranial nerve deficit.     Sensory: No sensory deficit.     Motor: No weakness.  Psychiatric:        Mood and Affect: Mood normal.        Behavior: Behavior normal.     ED Results / Procedures / Treatments   Labs (all labs ordered are listed, but only abnormal results are displayed) Labs Reviewed  COMPREHENSIVE METABOLIC PANEL - Abnormal; Notable for the following components:      Result Value   Sodium 134 (*)    Glucose, Bld 257 (*)    Total Protein 8.6 (*)    Albumin 3.0 (*)    AST 14 (*)    All other components within normal limits  LACTIC ACID, PLASMA - Abnormal; Notable for the following components:   Lactic Acid, Venous 2.0 (*)    All other components within normal limits  RESP PANEL BY RT-PCR (FLU A&B, COVID) ARPGX2  CULTURE, BLOOD (ROUTINE X 2)  CULTURE, BLOOD (ROUTINE X 2)  CBC WITH DIFFERENTIAL/PLATELET  LACTIC ACID, PLASMA  C-REACTIVE PROTEIN  SEDIMENTATION RATE  CBC  COMPREHENSIVE METABOLIC PANEL  HEMOGLOBIN A1C  LACTIC ACID, PLASMA  LACTIC ACID, PLASMA    EKG None  Radiology DG Foot Complete Left  Result Date: 02/05/2021 CLINICAL DATA:  Concern for infection, evaluate for obvious fracture, subcutaneous air EXAM: LEFT FOOT - COMPLETE 3+ VIEW COMPARISON:  Left foot radiographs 07/20/2019 FINDINGS: There is no evidence of acute fracture or dislocation. There is no osseous destruction identified. There is mild hallux valgus. The Lisfranc and Chopart joints are intact. There is diffuse soft tissue swelling about the foot. There is no definite soft tissue gas. There is no  radiopaque foreign body. IMPRESSION: 1. No radiographic evidence of osseous destruction in the foot, no definite soft tissue gas. 2. Diffuse soft tissue swelling about the foot. 3. MRI may be considered for increased sensitivity for detection of osteomyelitis. Electronically Signed   By: Valetta Mole M.D.   On: 02/05/2021 16:10   DG Foot Complete Right  Result Date: 02/05/2021 CLINICAL DATA:  Concern for infection in a 70 year old female with bilateral leg infection or swelling. EXAM: RIGHT FOOT COMPLETE - 3+ VIEW COMPARISON:  None FINDINGS: Suggestion of ulceration about the heel. Soft tissue swelling in a generalized fashion throughout the foot, perhaps more pronounced about the forefoot on the lateral view. No sign of frank bony destruction. No visible fracture. IMPRESSION: 1. Suggestion of ulceration about the heel. No sign of frank bony destruction or acute bony abnormality. 2. Soft tissue swelling in a generalized fashion throughout the foot, perhaps more pronounced about the forefoot on the lateral view. correlate with any signs of soft tissue infection or frank ulceration Electronically Signed   By: Zetta Bills M.D.   On: 02/05/2021 16:10    Procedures Procedures   Medications Ordered in ED Medications  vancomycin (VANCOREADY) IVPB 1750 mg/350 mL (has no administration in time range)  allopurinol (ZYLOPRIM) tablet 300 mg (has  no administration in time range)  HYDROcodone-acetaminophen (NORCO) 7.5-325 MG per tablet 1 tablet (has no administration in time range)  furosemide (LASIX) tablet 40 mg (has no administration in time range)  atenolol (TENORMIN) tablet 50 mg (has no administration in time range)  atenolol (TENORMIN) tablet 25 mg (has no administration in time range)  NIFEdipine (PROCARDIA XL/NIFEDICAL-XL) 24 hr tablet 90 mg (has no administration in time range)  polyethylene glycol (MIRALAX / GLYCOLAX) packet 17 g (has no administration in time range)  gabapentin (NEURONTIN)  capsule 600 mg (600 mg Oral Given 02/06/21 0012)  albuterol (PROVENTIL) (2.5 MG/3ML) 0.083% nebulizer solution 2.5 mg (has no administration in time range)  heparin injection 5,000 Units (5,000 Units Subcutaneous Given 02/06/21 0012)  insulin aspart (novoLOG) injection 0-9 Units (has no administration in time range)  insulin glargine-yfgn (SEMGLEE) injection 35 Units (has no administration in time range)  piperacillin-tazobactam (ZOSYN) IVPB 3.375 g (has no administration in time range)  lactated ringers bolus 500 mL (0 mLs Intravenous Stopped 02/05/21 2040)  fentaNYL (SUBLIMAZE) injection 50 mcg (50 mcg Intravenous Given 02/05/21 1822)  vancomycin (VANCOREADY) IVPB 2000 mg/400 mL (0 mg Intravenous Stopped 02/05/21 2319)  piperacillin-tazobactam (ZOSYN) IVPB 3.375 g (0 g Intravenous Stopped 02/05/21 2039)    ED Course  I have reviewed the triage vital signs and the nursing notes.  Pertinent labs & imaging results that were available during my care of the patient were reviewed by me and considered in my medical decision making (see chart for details).    MDM Rules/Calculators/A&P                           Patient presents for worsening BLE wounds.  She has a history of ulcerations to her distal lower extremities over several years.  Per chart review, there were times that these wounds have healed.  She has been on courses of antibiotics through the years.  She states that she is currently on day 10 of a Course of Keflex.  Despite this, she has had worsening pain, greater in her right lower extremity and worsening purulence, greater in her left lower extremity.  She reportedly saw her primary care doctor earlier today who advised to come into the ED for IV antibiotics.  On exam, patient has wounds to her lower extremities, as shown above.  Wounds are foul-smelling and purulent, especially on the left lower extremity.  She has associated tenderness.  X-ray imaging of lower extremities was obtained which  did not show clear evidence of osteomyelitis, although MRI would be a better study.  On lab work, patient does not have a leukocytosis.  Labs were notable for hypoalbuminemia, and a lactic acid of 2.0.  Broad-spectrum IV antibiotics and IV fluids were given.  Patient was admitted for further management.  Final Clinical Impression(s) / ED Diagnoses Final diagnoses:  None    Rx / DC Orders ED Discharge Orders     None        Godfrey Pick, MD 02/06/21 [phone]

## 2021-02-05 NOTE — Progress Notes (Signed)
Pharmacy Antibiotic Note  [name] is a 70 y.o. female admitted on 02/05/2021 with cellulitis.  Pharmacy has been consulted for vancomycin dosing.  Patient with a history of uncontrolled DM2 insulin dependent, lymphedema, bilateral chronic venous hypertension, HTN. Patient presenting after failing outpatient therapy with cephalexin for chronic BLE wound infection.  SCr is 0.8 which is at baseline. WBC 7.7; LA 2  Plan: Zosyn 3.375gm IV q8h Vancomycin 1750mg  q24hr unless change in renal function (eAUC 506) Monitor renal function and adjust dosing as appropriate F/u cultures De-escalate antibiotics as appropriate  Weight: 131.4 kg (289 lb 11 oz)  Temp (24hrs), Avg:98.2 F (36.8 C), Min:98 F (36.7 C), Max:98.4 F (36.9 C)  Recent Labs  Lab 02/05/21 1500 02/05/21 1617 02/05/21 1830  WBC 7.7  --   --   CREATININE 0.80  --   --   LATICACIDVEN  --  1.4 2.0*     Estimated Creatinine Clearance: 88.4 mL/min (by C-G formula based on SCr of 0.8 mg/dL).    Allergies  Allergen Reactions   Ace Inhibitors Swelling   Tizanidine    Metformin And Related Other (See Comments)    CHILLS   Propofol Itching    Antimicrobials this admission: Vancomycin 8/23 >>  Zosyn 8/23>>  Microbiology results: 8/23 BCx:   Thank you for allowing pharmacy to be a part of this patient's care.  9/23, PharmD, BCPS Please see amion for complete clinical pharmacist phone list 02/05/2021 11:43 PM

## 2021-02-05 NOTE — Progress Notes (Signed)
Pharmacy Antibiotic Note  [name] is a 70 y.o. female admitted on 02/05/2021 with cellulitis.  Pharmacy has been consulted for vancomycin dosing.  Patient with a history of uncontrolled DM2 insulin dependent, lymphedema, bilateral chronic venous hypertension, HTN. Patient presenting after failing outpatient therapy with cephalexin for chronic BLE wound infection.  SCr is 0.8 which is at baseline. WBC 7.7; LA 2  Plan: Vancomycin 2000mg  once then 1750mg  q24hr unless change in renal function (eAUC 506) Monitor renal function and adjust dosing as appropriate F/u cultures De-escalate antibiotics as appropriate     Temp (24hrs), Avg:98.2 F (36.8 C), Min:98 F (36.7 C), Max:98.4 F (36.9 C)  Recent Labs  Lab 02/05/21 1500 02/05/21 1617  WBC 7.7  --   CREATININE 0.80  --   LATICACIDVEN  --  1.4    CrCl cannot be calculated (Unknown ideal weight.).    Allergies  Allergen Reactions   Ace Inhibitors Swelling   Tizanidine    Metformin And Related Other (See Comments)    CHILLS   Propofol Itching    Antimicrobials this admission: Vancomycin 8/23 >>   Microbiology results: Pending  Thank you for allowing pharmacy to be a part of this patient's care.  02/07/21, PharmD, BCPS 02/05/2021 8:28 PM ED Clinical Pharmacist -  [phone]

## 2021-02-05 NOTE — ED Notes (Signed)
Pt placed on pure wic, linen changed.

## 2021-02-05 NOTE — Hospital Course (Addendum)
[name] is a 70 year old female admitted for bilateral lower extremity infection.  Past medical history of type 2 diabetes, hyperlipidemia, hypertension, H/O TIA, anxiety, gout, osteoarthritis.  Hospital course is outlined below.  Bilateral lower extremity wound infection likely cellulitis [name] is a 70 yr old patient who presents today for bilateral chronic wounds have worsened over the last week. Pt failed outpatient abx with Keflex, pt is unsure if she completed the course.  In the ED, CBC was unremarkable with WBC 7.7 and has remained afebrile. Lactic acid 1.4>2.0>1.5, on CMP glucose was 257, electrolytes wnl. Pt was initially non septic, no leukocytosis and afebrile which is reassuring. ABI of the left and right performed on 12/15/2018: No evidence of significant lower extremity arterial disease xrays were obtained of bilateral feet. Xray of the left foot showed no radiographic evidence of osseous destruction in the foot, no soft tissue gas. Right foot xray showed suggestion of ulceration about the heel. No sign of frank bony destruction or acute bony abnormality. Soft tissue swelling in a generalized fashion throughout the foot, perhaps more pronounced about the forefoot on the lateral view. correlate with any signs of soft tissue infection or frank ulceration.  Initially she was started on Vanco and Zosyn IV but was transitioned to p.o. Doxy and Augmentin.  MRI of the left foot was obtained and showed no evidence of osteomyelitis.  MRI of the right foot could not be obtained due to combative nature of the patient.  Wound cultures have shown Klebsiella, diphtheroids, blood cultures have shown no growth at 3 days.  Legs were elevated throughout admission using pillows.  Patient was hemodynamically stable and afebrile at time of discharge.   PCP Issues for Follow Up:   Wound culture grew Klebsiella. Discharged on Augmentin.  Continue to educate and encourage wound care with patient and  family.  Please consider stopping Atenolol as it is associated with increased risk of death.  Would also consider discontinuing nifedipine given risk of edema.  Unclear why she is on 325 mg of ASA. We changed her to 81 mg daily.  Seen by Psychiatrist while inpatient. 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  Flexeril discontinued as Trazodone was started.  Discharged on 35U of Lantus as this is what she was taking inpatient with adequate glucose control. Titrate up outpatient as needed.  Consider lab repeat as indicated.

## 2021-02-05 NOTE — ED Provider Notes (Signed)
Emergency Medicine Provider Triage Evaluation Note  [name] , a 70 y.o. female  was evaluated in triage.  Pt complains of leg infection.  She has taken three doses of keflex with out improvement in her symptoms.  She states it started on Friday.  Review of Systems  Positive: Leg pain, nausea, odors Negative: fevers  Physical Exam  BP (!) 184/104 (BP Location: Right Wrist)   Pulse 79   Temp 98 F (36.7 C) (Oral)   Resp 16   SpO2 98%  Gen:   Awake, no distress   Resp:  Normal effort  MSK:   Moves extremities without difficulty  Other:  Obvious odor, feet are wrapped in gauze.   Medical Decision Making  Medically screening exam initiated at 2:57 PM.  Appropriate orders placed.  [name] was informed that the remainder of the evaluation will be completed by another provider, this initial triage assessment does not replace that evaluation, and the importance of remaining in the ED until their evaluation is complete.  Note: Portions of this report may have been transcribed using voice recognition software. Every effort was made to ensure accuracy; however, inadvertent computerized transcription errors may be present    Cristina Gong, PA-C 02/05/21 1503    Rolan Bucco, MD 02/16/21 [phone]

## 2021-02-05 NOTE — ED Triage Notes (Signed)
Pt here sent from her md for possible bil leg infection left worse than right , just started on antibiotics yesterday , left leg has an fowl odor

## 2021-02-06 ENCOUNTER — Inpatient Hospital Stay (HOSPITAL_COMMUNITY): Payer: Medicare PPO

## 2021-02-06 ENCOUNTER — Encounter (HOSPITAL_BASED_OUTPATIENT_CLINIC_OR_DEPARTMENT_OTHER): Payer: Medicare PPO | Admitting: Physician Assistant

## 2021-02-06 DIAGNOSIS — L03119 Cellulitis of unspecified part of limb: Secondary | ICD-10-CM | POA: Diagnosis not present

## 2021-02-06 LAB — CBC
HCT: 39.5 % (ref 36.0–46.0)
Hemoglobin: 12.2 g/dL (ref 12.0–15.0)
MCH: 27.9 pg (ref 26.0–34.0)
MCHC: 30.9 g/dL (ref 30.0–36.0)
MCV: 90.2 fL (ref 80.0–100.0)
Platelets: 303 10*3/uL (ref 150–400)
RBC: 4.38 MIL/uL (ref 3.87–5.11)
RDW: 15.4 % (ref 11.5–15.5)
WBC: 8.1 10*3/uL (ref 4.0–10.5)
nRBC: 0 % (ref 0.0–0.2)

## 2021-02-06 LAB — COMPREHENSIVE METABOLIC PANEL
ALT: 11 U/L (ref 0–44)
AST: 13 U/L — ABNORMAL LOW (ref 15–41)
Albumin: 2.7 g/dL — ABNORMAL LOW (ref 3.5–5.0)
Alkaline Phosphatase: 90 U/L (ref 38–126)
Anion gap: 9 (ref 5–15)
BUN: 6 mg/dL — ABNORMAL LOW (ref 8–23)
CO2: 24 mmol/L (ref 22–32)
Calcium: 9.4 mg/dL (ref 8.9–10.3)
Chloride: 103 mmol/L (ref 98–111)
Creatinine, Ser: 0.88 mg/dL (ref 0.44–1.00)
GFR, Estimated: >60 mL/min (ref >60)
Glucose, Bld: 290 mg/dL — ABNORMAL HIGH (ref 70–99)
Potassium: 4.5 mmol/L (ref 3.5–5.1)
Sodium: 136 mmol/L (ref 135–145)
Total Bilirubin: 0.6 mg/dL (ref 0.3–1.2)
Total Protein: 8 g/dL (ref 6.5–8.1)

## 2021-02-06 LAB — SEDIMENTATION RATE: Sed Rate: 104 mm/hr — ABNORMAL HIGH (ref 0–22)

## 2021-02-06 LAB — GLUCOSE, CAPILLARY
Glucose-Capillary: 312 mg/dL — ABNORMAL HIGH (ref 70–99)
Glucose-Capillary: 262 mg/dL — ABNORMAL HIGH (ref 70–99)
Glucose-Capillary: 272 mg/dL — ABNORMAL HIGH (ref 70–99)
Glucose-Capillary: 298 mg/dL — ABNORMAL HIGH (ref 70–99)

## 2021-02-06 LAB — LACTIC ACID, PLASMA: Lactic Acid, Venous: 1.5 mmol/L (ref 0.5–1.9)

## 2021-02-06 LAB — FOLATE: Folate: 9.8 ng/mL (ref >5.9)

## 2021-02-06 LAB — HEMOGLOBIN A1C
Hgb A1c MFr Bld: 10.4 % — ABNORMAL HIGH (ref 4.8–5.6)
Mean Plasma Glucose: 251.78 mg/dL

## 2021-02-06 LAB — VITAMIN B12: Vitamin B-12: 342 pg/mL (ref 180–914)

## 2021-02-06 MED ORDER — AMOXICILLIN-POT CLAVULANATE 875-125 MG PO TABS
1.0000 | ORAL_TABLET | Freq: Two times a day (BID) | ORAL | Status: DC
  Administered 2021-02-06 – 2021-02-08 (×5): 1 via ORAL
  Filled 2021-02-06 (×5): qty 1

## 2021-02-06 MED ORDER — DOXYCYCLINE HYCLATE 100 MG PO TABS
100.0000 mg | ORAL_TABLET | Freq: Two times a day (BID) | ORAL | Status: DC
  Administered 2021-02-06 – 2021-02-08 (×5): 100 mg via ORAL
  Filled 2021-02-06 (×5): qty 1

## 2021-02-06 MED ORDER — GADOBUTROL 1 MMOL/ML IV SOLN
10.0000 mL | Freq: Once | INTRAVENOUS | Status: AC | PRN
  Administered 2021-02-06: 10 mL via INTRAVENOUS

## 2021-02-06 NOTE — Progress Notes (Signed)
FPTS Brief Progress Note  S: Patient was sleeping in bed.  I did not wake the patient.   O: BP (!) 104/53 (BP Location: Right Arm)   Pulse 86   Temp 99.2 F (37.3 C) (Oral)   Resp 18   Ht 5\' 5"  (1.651 m)   Wt (!) 142.8 kg   SpO2 90%   BMI 52.40 kg/m   General: No acute distress, sleeping in bed   A/P: Plan per day team - Orders reviewed. Labs for AM ordered, which was adjusted as needed.  -Patient has not received MRI of the right foot, she was combative to staff -P.o. Doxy and Augmentin -Keep feet elevated  , MD 02/06/2021, 10:43 PM PGY-1, [facility] Night Resident  Please page [phone] with questions.

## 2021-02-06 NOTE — Evaluation (Signed)
Occupational Therapy Evaluation Patient Details Name: [name] MRN: [id number] DOB: [birth year] Today's Date: 02/06/2021    History of Present Illness 70 yo female admitted with BIL LE ulcer pain with drainage. + cellulitisPMH venous stasis, HTN DM2 morbid obesity wheelchair use gout chronic back pain HLD   Clinical Impression   PT admitted with BIL LE cellulitis. Pt currently with functional limitiations due to the deficits listed below (see OT problem list). Pt currently high risk for skin break down due to bed level positioning so bari chair provided this session to help with activity tolerance. Pt min guard with elevated surfaces and bari RW.  Pt will benefit from skilled OT to increase their independence and safety with adls and balance to allow discharge HHOT.     Follow Up Recommendations  Home health OT    Equipment Recommendations  None recommended by OT    Recommendations for Other Services       Precautions / Restrictions Precautions Precautions: Fall Precaution Comments: daily dressing change of bil LE per WOC notes by RN staff      Mobility Bed Mobility Overal bed mobility: Needs Assistance Bed Mobility: Rolling;Supine to Sit Rolling: Supervision   Supine to sit: Supervision     General bed mobility comments: heavy use of bed rail and hob > 45 degrees    Transfers Overall transfer level: Needs assistance Equipment used: Rolling walker (2 wheeled) (bari walker) Transfers: Sit to/from Stand Sit to Stand: Min guard         General transfer comment: pt with widen base of support    Balance Overall balance assessment: Mild deficits observed, not formally tested                                         ADL either performed or assessed with clinical judgement   ADL Overall ADL's : Needs assistance/impaired Eating/Feeding: Set up   Grooming: Set up   Upper Body Bathing: Minimal assistance   Lower Body Bathing: Maximal  assistance           Toilet Transfer: Min guard;RW (recommend elevates surface such as bari commode)             General ADL Comments: pt oob to bari chair this session and ordering lunch     Vision Baseline Vision/History: 1 Wears glasses Ability to See in Adequate Light: 0 Adequate Patient Visual Report: No change from baseline       Perception     Praxis      Pertinent Vitals/Pain Pain Assessment: No/denies pain     Hand Dominance Right   Extremity/Trunk Assessment Upper Extremity Assessment Upper Extremity Assessment: Overall WFL for tasks assessed   Lower Extremity Assessment Lower Extremity Assessment: Defer to PT evaluation   Cervical / Trunk Assessment Cervical / Trunk Assessment: Kyphotic (body habitus changes an anterior shift for posture, pannus noted)   Communication Communication Communication: No difficulties   Cognition Arousal/Alertness: Awake/alert Behavior During Therapy: WFL for tasks assessed/performed Overall Cognitive Status: Within Functional Limits for tasks assessed                                     General Comments       Exercises     Shoulder Instructions      Home Living Family/patient expects  to be discharged to:: Private residence Living Arrangements: Spouse/significant other;Children Available Help at Discharge: Family;Available 24 hours/day Type of Home: Apartment Home Access: Ramped entrance     Home Layout: One level     Bathroom Shower/Tub: Chief Strategy Officer: Standard     Home Equipment: Wheelchair - power;Wheelchair - [employer];Shower seat;Adaptive equipment;Hospital bed Adaptive Equipment: Reacher;Long-handled sponge Additional Comments: transfers only and uses electric scooter to mobilize      Prior Functioning/Environment Level of Independence: Needs assistance    ADL's / Homemaking Assistance Needed: reports sponge bath only with AE   Comments:  husband drives, manages meds, pays bills, etc        OT Problem List: Impaired balance (sitting and/or standing);Decreased activity tolerance;Decreased knowledge of use of DME or AE;Decreased knowledge of precautions;Obesity      OT Treatment/Interventions: Self-care/ADL training;Therapeutic exercise;Energy conservation;DME and/or AE instruction;Therapeutic activities;Patient/family education;Balance training    OT Goals(Current goals can be found in the care plan section) Acute Rehab OT Goals Patient Stated Goal: to get wound wraps changed today OT Goal Formulation: With patient Time For Goal Achievement: 02/20/21 Potential to Achieve Goals: Good  OT Frequency: Min 2X/week   Barriers to D/C: Decreased caregiver support  spouse there but pt normally mod i       Co-evaluation              AM-PAC OT "6 Clicks" Daily Activity     Outcome Measure Help from another person eating meals?: A Little Help from another person taking care of personal grooming?: A Little Help from another person toileting, which includes using toliet, bedpan, or urinal?: A Little Help from another person bathing (including washing, rinsing, drying)?: A Lot Help from another person to put on and taking off regular upper body clothing?: A Little Help from another person to put on and taking off regular lower body clothing?: A Lot 6 Click Score: 16   End of Session Equipment Utilized During Treatment: Rolling walker Nurse Communication: Mobility status;Precautions  Activity Tolerance: Patient tolerated treatment well Patient left: in chair;with call bell/phone within reach;with chair alarm set;with nursing/sitter in room  OT Visit Diagnosis: Unsteadiness on feet (R26.81)                Time: 1400-1420 OT Time Calculation (min): 20 min Charges:  OT General Charges $OT Visit: 1 Visit OT Evaluation $OT Eval Moderate Complexity: 1 Mod   Brynn, OTR/L  Acute Rehabilitation Services Pager:  [phone] Office: [phone] .   Mateo Flow 02/06/2021, 3:20 PM

## 2021-02-06 NOTE — Progress Notes (Signed)
Family Medicine Teaching Service Daily Progress Note Intern Pager: [phone]  Patient name: [name] Medical record number: [id number] Date of birth: 12/29/1950 Age: 70 y.o. Gender: female  Primary Care Provider: Crain, Whitney L, PA Consultants: Wound care Code Status: DNR  Pt Overview and Major Events to Date:  8/23-Admitted for chronic bilateral lower extremity wounds with infection  Assessment and Plan: [name] is a 70-year-old female with worsening chronic bilateral wound infections, admitted for failure of outpatient oral antibiotics for bilateral wound infections.  Past medical history significant for uncontrolled T 2 DM insulin-dependent, lymphedema but noncompliant with treatment, bilateral chronic venous hypertension, hypertension, wheelchair-bound, GERD, dementia, depression.  Chronic bilateral lower extremity wound infection, likely multifactorial Patient remains affect afebrile with white blood cell count normal range and no tachycardia, and does not appear septic.WBC 8.1 this morning.  Lactic acidosis is resolved from 1.4>2.0> 1.5 this morning.  Pain greater in right leg compared to left.  Left leg with more purulent drainage, per patient.  Patient has had numerous hospital admissions in the past for 6 bilateral lower extremity cellulitis infection similar to this.  No history of osteomyelitis.   Per wound consult, it sounds as though the patient is noncompliant with lymphedema management and does not use lymphedema pump or elevate legs at home, which matches with her recurrent need for antibiotics and hospitalization for infection.  If the patient continues to refuse compliance with management of lymphedema, these wounds will continue to recur.   This is likely secondary to venous insufficiency in the setting of chronic poor wound care. Patient does not require broad-spectrum antibiotics at this time- will transition to PO antibiotics with MRSA coverage. -Vitals per  floor routine -D/c IV antibiotics -Start PO Doxycycline -Start PO Augmentin -ACE bandage wrap -Norco as needed for pain -Wound care consulted and signed off, greatly appreciate their care and recommendations -Follow-up blood cultures -Follow-up wound culture -Follow-up right MRI foot for possible osteomyelitis-follow-up ESR and CRP -Ortho consulted, appreciate recommendations -PT/OT -Heparin for DVT prophylaxis  T2DDM-insulin-dependent Last A1c: 6/20-7.0% Home meds: Lantus 70 units each morning -Continue Lantus 35 units daily can, increasing as necessary -SSI -Regular CBG 4 times daily with meals and bedtime  Gout Last flare >1 mo ago - Continue home meds  Chronic back pain, neuropathy Chronic, stable -Continue home meds  HTN BP 120s-150s/80s-90s - Continue home meds  Possible Dementia Will complete MOCHA assessment with patient. Will get labs to rule out other causes. She is alert and oriented to self, location, year, birth date, president, and reasoning for hospitalization. Question if patient has capacity. - B12 - Folate - RPR - Delirium precautions - PT/OT evaluation- question if patient would benefit from SNF/long-term placement  FEN/GI: Carb modified PPx: Heparin Dispo:Pending PT recommendations  pending clinical improvement . Barriers include IV antibiotics.   Subjective:  [name] has no major complaints this morning.  Her right lower extremity is far more painful than the left.  She says the left leg has more drainage than the right.  No nausea, vomiting, diarrhea.  She was looking at the breakfast menu to order breakfast while was in the room.  She is alert and oriented x4.  Objective: Temp:  [98 F (36.7 C)-98.4 F (36.9 C)] 98.4 F (36.9 C) (08/24 0900) Pulse Rate:  [63-86] 86 (08/24 0900) Resp:  [14-26] 18 (08/24 0900) BP: (117-184)/(55-104) 117/55 (08/24 0900) SpO2:  [94 %-100 %] 100 % (08/24 0900) Weight:  [131.4 kg] 131.4 kg (08/23  2000)   Physical Exam: General: Morbidly obese, laying in the bed on her left side Cardiovascular: Regular rate and rhythm, normal S1 and S2 Respiratory: Lungs clear to auscultation in all lung fields Abdomen: Obese, nondistended Extremities: Diffuse warmth over bilateral lower extremities both with bandage wrap in place with no dried blood or drainage noted.  No odor appreciated -Neuro: Awake alert and oriented to self, location, year, birthdate, and president.  Speech clear and fluent.  Able to answer all questions.  Patient is aware of reason for hospitalization  Laboratory: Recent Labs  Lab 02/05/21 1500 02/06/21 0451  WBC 7.7 8.1  HGB 12.4 12.2  HCT 39.5 39.5  PLT 365 303   Recent Labs  Lab 02/05/21 1500 02/06/21 0451  NA 134* 136  K 3.7 4.5  CL 102 103  CO2 25 24  BUN 8 6*  CREATININE 0.80 0.88  CALCIUM 9.3 9.4  PROT 8.6* 8.0  BILITOT 0.4 0.6  ALKPHOS 103 90  ALT 13 11  AST 14* 13*  GLUCOSE 257* 290*    Imaging/Diagnostic Tests: MR FOOT LEFT W WO CONTRAST  Result Date: 02/06/2021 CLINICAL DATA:  Foot pain and swelling. EXAM: MRI OF THE LEFT FOREFOOT WITHOUT AND WITH CONTRAST TECHNIQUE: Multiplanar, multisequence MR imaging of the foot was performed both before and after administration of intravenous contrast. CONTRAST:  10mL GADAVIST GADOBUTROL 1 MMOL/ML IV SOLN COMPARISON:  None. FINDINGS: TENDONS Peroneal: Peroneal longus tendon intact. Peroneal brevis intact. Posteromedial: Posterior tibial tendon intact. Flexor hallucis longus tendon intact. Flexor digitorum longus tendon intact. Anterior: Tibialis anterior tendon intact. Extensor hallucis longus tendon intact Extensor digitorum longus tendon intact. Achilles:  Intact. Plantar Fascia: Intact. LIGAMENTS Lateral: Anterior talofibular ligament intact. Calcaneofibular ligament intact. Posterior talofibular ligament intact. Anterior and posterior tibiofibular ligaments intact. Medial: Deltoid ligament intact. Spring  ligament intact. CARTILAGE Ankle Joint: No joint effusion. Normal ankle mortise. No chondral defect. Subtalar Joints/Sinus Tarsi: Normal subtalar joints. No subtalar joint effusion. Normal sinus tarsi. Bones: No marrow signal abnormality.  No fracture or dislocation. Soft Tissue: No fluid collection or hematoma. Muscles are normal without edema or atrophy. Tarsal tunnel is normal. Nonspecific soft tissue swelling and skin thickening along the dorsal aspect of the foot and around the ankle extending into the lower leg which may be reactive versus secondary to cellulitis. IMPRESSION: 1. Nonspecific soft tissue swelling and skin thickening along the dorsal aspect of the foot and around the ankle extending into the lower leg which may be reactive versus secondary to cellulitis. 2. No osteomyelitis of the left foot. Electronically Signed   By: Hetal  Patel M.D.   On: 02/06/2021 06:09   DG Foot Complete Left  Result Date: 02/05/2021 CLINICAL DATA:  Concern for infection, evaluate for obvious fracture, subcutaneous air EXAM: LEFT FOOT - COMPLETE 3+ VIEW COMPARISON:  Left foot radiographs 07/20/2019 FINDINGS: There is no evidence of acute fracture or dislocation. There is no osseous destruction identified. There is mild hallux valgus. The Lisfranc and Chopart joints are intact. There is diffuse soft tissue swelling about the foot. There is no definite soft tissue gas. There is no radiopaque foreign body. IMPRESSION: 1. No radiographic evidence of osseous destruction in the foot, no definite soft tissue gas. 2. Diffuse soft tissue swelling about the foot. 3. MRI may be considered for increased sensitivity for detection of osteomyelitis. Electronically Signed   By: Peter  Noone M.D.   On: 02/05/2021 16:10   DG Foot Complete Right  Result Date: 02/05/2021 CLINICAL DATA:  Concern for   infection in a 70 year old female with bilateral leg infection or swelling. EXAM: RIGHT FOOT COMPLETE - 3+ VIEW COMPARISON:  None  FINDINGS: Suggestion of ulceration about the heel. Soft tissue swelling in a generalized fashion throughout the foot, perhaps more pronounced about the forefoot on the lateral view. No sign of frank bony destruction. No visible fracture. IMPRESSION: 1. Suggestion of ulceration about the heel. No sign of frank bony destruction or acute bony abnormality. 2. Soft tissue swelling in a generalized fashion throughout the foot, perhaps more pronounced about the forefoot on the lateral view. correlate with any signs of soft tissue infection or frank ulceration Electronically Signed   By: Zetta Bills M.D.   On: 02/05/2021 16:10     Orvis Brill, DO 02/06/2021, 9:38 AM PGY-1, [facility] Intern pager: [phone], text pages welcome

## 2021-02-06 NOTE — Evaluation (Signed)
Physical Therapy Evaluation Patient Details Name: [facility] MRN: [id number] DOB: 1950-06-26 Today's Date: 02/06/2021   History of Present Illness  70 yo female admitted with BIL LE ulcer pain with drainage. + cellulitisPMH venous stasis, HTN DM2 morbid obesity wheelchair use gout chronic back pain HLD  Clinical Impression   Patient received in bed, pleasant and motivated to work with therapy. Needed MinA and HOB elevated to get to EOB, but otherwise able to perform at a supervision to light min guard level with cues for sequencing. Reports her husband is available 24/7 and sounds like he gives great support. Left in bed positioned to comfort with all needs met. Will benefit from skilled HHPT f/u at DC.     Follow Up Recommendations Home health PT;Supervision/Assistance - 24 hour    Equipment Recommendations  Rolling walker with 5" wheels (bariatric)    Recommendations for Other Services       Precautions / Restrictions Precautions Precautions: Fall Precaution Comments: daily dressing change of bil LE per WOC notes by RN staff Restrictions Weight Bearing Restrictions: No      Mobility  Bed Mobility Overal bed mobility: Needs Assistance Bed Mobility: Supine to Sit Rolling: Supervision   Supine to sit: Min assist     General bed mobility comments: heavy use of bed rail and hob  moderately elevated    Transfers Overall transfer level: Needs assistance Equipment used: Rolling walker (2 wheeled) Transfers: Sit to/from Stand Sit to Stand: Supervision         General transfer comment: pt with widen base of support, cues for hand placement and increased effort  Ambulation/Gait             General Gait Details: able to take sidesteps with RW and min guard, WC bound at baseline  Stairs            Wheelchair Mobility    Modified Rankin (Stroke Patients Only)       Balance Overall balance assessment: Mild deficits observed, not formally tested                                            Pertinent Vitals/Pain Pain Assessment: No/denies pain    Home Living Family/patient expects to be discharged to:: Private residence Living Arrangements: Spouse/significant other;Children Available Help at Discharge: Family;Available 24 hours/day Type of Home: Apartment Home Access: Ramped entrance     Home Layout: One level Home Equipment: Wheelchair - power;Wheelchair - [employer];Shower seat;Adaptive equipment;Hospital bed Additional Comments: transfers only and uses electric scooter to mobilize    Prior Function Level of Independence: Needs assistance      ADL's / Homemaking Assistance Needed: reports sponge bath only with AE  Comments: husband drives, manages meds, pays bills, etc     Hand Dominance   Dominant Hand: Right    Extremity/Trunk Assessment   Upper Extremity Assessment Upper Extremity Assessment: Defer to OT evaluation    Lower Extremity Assessment Lower Extremity Assessment: Overall WFL for tasks assessed    Cervical / Trunk Assessment Cervical / Trunk Assessment: Kyphotic  Communication   Communication: No difficulties  Cognition Arousal/Alertness: Awake/alert Behavior During Therapy: WFL for tasks assessed/performed Overall Cognitive Status: Within Functional Limits for tasks assessed  General Comments      Exercises     Assessment/Plan    PT Assessment Patient needs continued PT services  PT Problem List Decreased strength;Obesity;Decreased activity tolerance;Decreased mobility       PT Treatment Interventions DME instruction;Balance training;Gait training;Functional mobility training;Patient/family education;Therapeutic activities;Therapeutic exercise;Cognitive remediation    PT Goals (Current goals can be found in the Care Plan section)  Acute Rehab PT Goals Patient Stated Goal: to get wound wraps changed  today PT Goal Formulation: With patient Time For Goal Achievement: 02/20/21 Potential to Achieve Goals: Good    Frequency Min 3X/week   Barriers to discharge        Co-evaluation               AM-PAC PT "6 Clicks" Mobility  Outcome Measure Help needed turning from your back to your side while in a flat bed without using bedrails?: A Little Help needed moving from lying on your back to sitting on the side of a flat bed without using bedrails?: A Little Help needed moving to and from a bed to a chair (including a wheelchair)?: A Little Help needed standing up from a chair using your arms (e.g., wheelchair or bedside chair)?: A Little Help needed to walk in hospital room?: A Lot Help needed climbing 3-5 steps with a railing? : Total 6 Click Score: 15    End of Session   Activity Tolerance: Patient tolerated treatment well Patient left: in bed;with call bell/phone within reach Nurse Communication: Mobility status PT Visit Diagnosis: Muscle weakness (generalized) (M62.81);Difficulty in walking, not elsewhere classified (R26.2)    Time: 6606-0045 PT Time Calculation (min) (ACUTE ONLY): 27 min   Charges:   PT Evaluation $PT Eval Moderate Complexity: 1 Mod PT Treatments $Therapeutic Activity: 8-22 mins       Windell Norfolk, DPT, PN2   Supplemental Physical Therapist [place]    Pager [phone] Acute Rehab Office [phone]

## 2021-02-06 NOTE — Progress Notes (Signed)
Pt's bilateral LE cleansed with warm soap and water, cultures obtained from each leg, and legs were wrapped with xeroform gauze and then kerlix and 4 in ace wraps.

## 2021-02-06 NOTE — Consult Note (Signed)
WOC Nurse Consult Note: Patient receiving care in [facility] [phone]. Consult completed after review of record and image of BLE Reason for Consult: BLE wounds, chronic in nature, related to non-compliance with lymphedema management--failure to use lymphedema pump and elevate legs. Wound type: chronic Pressure Injury POA: Yes/No/NA Measurement: Wound bed: see photos Drainage (amount, consistency, odor)  Periwound: Dressing procedure/placement/frequency: Wash BLE with soap and water, pat dry. Place Xeroform gauzes [place] [phone]) over all open wounds. Beginning behind the toes and going to just below the knees, spiral wrap kerlex, then 4 inch ace wrap. Perform daily. This will allow daily viewing of the extremities for on-going monitoring of the condition.  As indicated in the note from 01/14/21 by Job Founds at the [facility], if the patient is refusing to be compliant with the management of her lymphedema, there isn't a lot that can be done to prevent these wounds from developing.  Monitor the wound area(s) for worsening of condition such as: Signs/symptoms of infection,  Increase in size,  Development of or worsening of odor, Development of pain, or increased pain at the affected locations.  Notify the medical team if any of these develop.  WOC nurse will not follow at this time.  Please re-consult the WOC team if needed.  Helmut Muster, RN, MSN, CWOCN, CNS-BC, pager [phone]

## 2021-02-06 NOTE — Progress Notes (Signed)
Eval complete, documentation pending. Needed MinA to get to EOB but usually sleeps in lift chair- otherwise able to mobilize on supervision basis with RW. Will benefit from HHPT f/u, 24/7S, and bari-RW moving forward.    Madelaine Etienne, DPT, PN2   Supplemental Physical Therapist [facility]    Pager [phone] Acute Rehab Office [phone]

## 2021-02-06 NOTE — Progress Notes (Signed)
Inpatient Diabetes Program Recommendations  AACE/ADA: New Consensus Statement on Inpatient Glycemic Control   Target Ranges:  Prepandial:   less than 140 mg/dL      Peak postprandial:   less than 180 mg/dL (1-2 hours)      Critically ill patients:  140 - 180 mg/dL  Results for [name], [name] (MRN [id number]) as of 02/06/2021 12:46  Ref. Range 02/06/2021 09:05 02/06/2021 12:34  Glucose-Capillary Latest Ref Range: 70 - 99 mg/dL 366 (H) 440 (H)   Results for [name], [name] (MRN [id number]) as of 02/06/2021 11:29  Ref. Range 12/03/2020 15:30 02/05/2021 15:00  Hemoglobin A1C Latest Ref Range: 4.8 - 5.6 % 7.0 (A) 10.4 (H)    Review of Glycemic Control  Diabetes history: DM2 Outpatient Diabetes medications: Lantus 70 units QAM Current orders for Inpatient glycemic control: Semglee 35 units daily, Novolog 0-9 units TID with meals  Inpatient Diabetes Program Recommendations:    Insulin: Please consider adding Novolog 4 units TID with meals for meal coverage if patient eats at least 50% of meals. Also, please consider adding Novolog 0-5 units QHS for bedtime correction.  HbgA1C:  A1C 10.4% on 02/05/21 indicating an average glucose of 252 mg/dl over the past 2-3 months.   NOTE: In reviewing chart, noted patient sees Dr. Everardo All (Endocrinologist) and was last seen 12/03/20. Per office note on 12/03/20 by Dr. Everardo All, patient was changed from Novolog 17 units BID and Glipizide 10 mg BID to Lantus 50 units daily. Per chart, patient's A1C was 7% on 12/03/20 and current A1C 10.4% on 02/05/21.  Addendum 02/06/21@12 :35-Spoke with patient at bedside regarding DM. Patient confirms that she is taking Lantus 70 units QAM for DM control and that DM medications were recently changed. Patient states that she has been dx with dementia and she frequently forgets to take her medicine but her husband tries to help her remember to take it. Patient reports that she craves sweets and that she often has cookies or cake even  though she knows that she should not be eating them. Patient reports that she has always struggled with eating sweets and her weight. Patient reports that she has been checking glucose but she does not recall what her glucose numbers have been.  Inquired about prior A1C and patient reports that her A1C is usually in a good range and she thought her last A1C was 7%. Confirmed that her A1C was 7% on 12/03/20 and discussed current A1C results (10.4% on 02/05/21) and explained that current A1C indicates an average glucose of 252 mg/dl over the past 2-3 months. Discussed glucose and A1C goals. Discussed importance of checking CBGs and maintaining good CBG control to prevent long-term and short-term complications. Explained how hyperglycemia leads to damage within blood vessels which lead to the common complications seen with uncontrolled diabetes. Encouraged patient to try to eat less sweets and perhaps talk with family about not bringing them in the house (reports family brings it and she eats it).  Inquired about ever using any GLP-1 DM medications and she states that she has never taken an injection of any DM medications except insulin. Encouraged patient to talk with Dr. Everardo All about possibility of using a once a week GLP-1 medication (if appropriate) which may help with DM control as well as her weight.  Patient verbalized understanding of information discussed and reports no further questions at this time related to diabetes.  Thanks, Orlando Penner, RN, MSN, CDE Diabetes Coordinator Inpatient Diabetes Program [phone] (Team Pager from 8am  to 5pm)

## 2021-02-06 NOTE — ED Notes (Signed)
Report given to Nicole RN

## 2021-02-06 NOTE — ED Notes (Signed)
PT OTF to MRI

## 2021-02-06 NOTE — Discharge Instructions (Signed)
Please talk with Dr. Everardo All about possibility of using a once a week GLP-1 (glucagon-like peptide) medication (if appropriate) which may help with DM control as well weight loss.   Talk with family about ways they may be able to help you remember to take your medications consistently.
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
## 2021-02-07 DIAGNOSIS — L03119 Cellulitis of unspecified part of limb: Secondary | ICD-10-CM | POA: Diagnosis not present

## 2021-02-07 DIAGNOSIS — F5101 Primary insomnia: Secondary | ICD-10-CM

## 2021-02-07 DIAGNOSIS — F4321 Adjustment disorder with depressed mood: Secondary | ICD-10-CM

## 2021-02-07 DIAGNOSIS — F6089 Other specific personality disorders: Secondary | ICD-10-CM

## 2021-02-07 LAB — CBC
HCT: 35.1 % — ABNORMAL LOW (ref 36.0–46.0)
Hemoglobin: 11.2 g/dL — ABNORMAL LOW (ref 12.0–15.0)
MCH: 27.9 pg (ref 26.0–34.0)
MCHC: 31.9 g/dL (ref 30.0–36.0)
MCV: 87.5 fL (ref 80.0–100.0)
Platelets: 301 10*3/uL (ref 150–400)
RBC: 4.01 MIL/uL (ref 3.87–5.11)
RDW: 15.2 % (ref 11.5–15.5)
WBC: 7.9 10*3/uL (ref 4.0–10.5)
nRBC: 0 % (ref 0.0–0.2)

## 2021-02-07 LAB — GLUCOSE, CAPILLARY
Glucose-Capillary: 232 mg/dL — ABNORMAL HIGH (ref 70–99)
Glucose-Capillary: 363 mg/dL — ABNORMAL HIGH (ref 70–99)
Glucose-Capillary: 198 mg/dL — ABNORMAL HIGH (ref 70–99)
Glucose-Capillary: 196 mg/dL — ABNORMAL HIGH (ref 70–99)

## 2021-02-07 LAB — RPR: RPR Ser Ql: NONREACTIVE

## 2021-02-07 MED ORDER — SENNA 8.6 MG PO TABS
1.0000 | ORAL_TABLET | Freq: Every day | ORAL | Status: DC
  Administered 2021-02-07 – 2021-02-08 (×2): 8.6 mg via ORAL
  Filled 2021-02-07 (×2): qty 1

## 2021-02-07 MED ORDER — INSULIN ASPART 100 UNIT/ML IJ SOLN
0.0000 [IU] | Freq: Three times a day (TID) | INTRAMUSCULAR | Status: DC
  Administered 2021-02-07: 3 [IU] via SUBCUTANEOUS
  Administered 2021-02-07: 15 [IU] via SUBCUTANEOUS
  Administered 2021-02-08: 5 [IU] via SUBCUTANEOUS
  Administered 2021-02-08: 3 [IU] via SUBCUTANEOUS

## 2021-02-07 MED ORDER — INSULIN ASPART 100 UNIT/ML IJ SOLN
5.0000 [IU] | Freq: Three times a day (TID) | INTRAMUSCULAR | Status: DC
  Administered 2021-02-07 – 2021-02-08 (×4): 5 [IU] via SUBCUTANEOUS

## 2021-02-07 MED ORDER — ENOXAPARIN SODIUM 80 MG/0.8ML IJ SOSY
70.0000 mg | PREFILLED_SYRINGE | Freq: Every day | INTRAMUSCULAR | Status: DC
  Administered 2021-02-07: 70 mg via SUBCUTANEOUS
  Filled 2021-02-07: qty 0.8

## 2021-02-07 MED ORDER — POLYETHYLENE GLYCOL 3350 17 G PO PACK
17.0000 g | PACK | Freq: Two times a day (BID) | ORAL | Status: DC
  Administered 2021-02-07 – 2021-02-08 (×3): 17 g via ORAL
  Filled 2021-02-07 (×3): qty 1

## 2021-02-07 MED ORDER — TRAZODONE HCL 100 MG PO TABS
100.0000 mg | ORAL_TABLET | Freq: Every day | ORAL | Status: DC
  Administered 2021-02-07: 100 mg via ORAL
  Filled 2021-02-07: qty 1

## 2021-02-07 MED ORDER — CYCLOBENZAPRINE HCL 5 MG PO TABS
5.0000 mg | ORAL_TABLET | Freq: Three times a day (TID) | ORAL | Status: DC | PRN
  Administered 2021-02-07 – 2021-02-08 (×3): 5 mg via ORAL
  Filled 2021-02-07 (×3): qty 1

## 2021-02-07 MED ORDER — INSULIN ASPART 100 UNIT/ML IJ SOLN: 0.0000 [IU] | Freq: Every day | INTRAMUSCULAR | Status: DC

## 2021-02-07 NOTE — Progress Notes (Addendum)
Bilateral leg dressing change done as ordered. Pain med given, tolerated well. Kept both legs elevated with pillows.

## 2021-02-07 NOTE — Progress Notes (Signed)
Occupational Therapy Treatment Patient Details Name: [name] MRN: [id number] DOB: 17-Jul-1950 Today's Date: 02/07/2021    History of present illness 70 yo female admitted with BIL LE ulcer pain with drainage. + cellulitisPMH venous stasis, HTN DM2 morbid obesity wheelchair use gout chronic back pain HLD   OT comments  Pt progressing towards acute OT goals. Focus of session was functional transfers. Discussed strategies for reducing fall risk. Up in recliner at end of session. D/c plan remains appropriate.    Follow Up Recommendations  Home health OT    Equipment Recommendations  None recommended by OT    Recommendations for Other Services      Precautions / Restrictions Precautions Precautions: Fall Precaution Comments: daily dressing change of bil LE per WOC notes by RN staff Restrictions Weight Bearing Restrictions: No       Mobility Bed Mobility Overal bed mobility: Needs Assistance Bed Mobility: Supine to Sit     Supine to sit: Min guard     General bed mobility comments: heavy use of bed rail, HOB elevated. Utilized momentum. min guard for safety    Transfers Overall transfer level: Needs assistance Equipment used: Rolling walker (2 wheeled) Transfers: Sit to/from [employer] Sit to Stand: Supervision Stand pivot transfers: Supervision       General transfer comment: wide BOS. cues for hand placement and technique with rw.    Balance Overall balance assessment: Mild deficits observed, not formally tested                                         ADL either performed or assessed with clinical judgement   ADL Overall ADL's : Needs assistance/impaired                         Toilet Transfer: Min guard;RW             General ADL Comments: Pt completed bed mobility and took pivotal steps to sit up in recliner.     Vision       Perception     Praxis      Cognition Arousal/Alertness:  Awake/alert Behavior During Therapy: WFL for tasks assessed/performed Overall Cognitive Status: Within Functional Limits for tasks assessed                                          Exercises     Shoulder Instructions       General Comments      Pertinent Vitals/ Pain       Pain Assessment: Faces Faces Pain Scale: Hurts little more Pain Location: back Pain Descriptors / Indicators: Aching;Sore Pain Intervention(s): Repositioned;Monitored during session;Limited activity within patient's tolerance  Home Living                                          Prior Functioning/Environment              Frequency  Min 2X/week        Progress Toward Goals  OT Goals(current goals can now be found in the care plan section)  Progress towards OT goals: Progressing toward goals  Acute Rehab OT Goals Patient  Stated Goal: to get wound wraps changed daily OT Goal Formulation: With patient Time For Goal Achievement: 02/20/21 Potential to Achieve Goals: Good ADL Goals Pt Will Perform Upper Body Bathing: with modified independence;with adaptive equipment Pt Will Perform Lower Body Bathing: with modified independence;with adaptive equipment Additional ADL Goal #1: pt will complete basic transfer mod I with RW the distance of bed to bathroom ( home environment)  Plan Discharge plan remains appropriate    Co-evaluation                 AM-PAC OT "6 Clicks" Daily Activity     Outcome Measure   Help from another person eating meals?: A Little Help from another person taking care of personal grooming?: A Little Help from another person toileting, which includes using toliet, bedpan, or urinal?: A Little Help from another person bathing (including washing, rinsing, drying)?: A Lot Help from another person to put on and taking off regular upper body clothing?: A Little Help from another person to put on and taking off regular lower body  clothing?: A Lot 6 Click Score: 16    End of Session Equipment Utilized During Treatment: Rolling walker  OT Visit Diagnosis: Unsteadiness on feet (R26.81)   Activity Tolerance Patient tolerated treatment well   Patient Left in chair;with call bell/phone within reach;with chair alarm set;with nursing/sitter in room   Nurse Communication          Time: 4742-5956 OT Time Calculation (min): 20 min  Charges: OT General Charges $OT Visit: 1 Visit OT Treatments $Self Care/Home Management : 8-22 mins  Raynald Kemp, OT Acute Rehabilitation Services Pager: [phone] Office: [phone]    Pilar Grammes 02/07/2021, 12:23 PM
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
## 2021-02-07 NOTE — Progress Notes (Signed)
Inpatient Diabetes Program Recommendations  AACE/ADA: New Consensus Statement on Inpatient Glycemic Control   Target Ranges:  Prepandial:   less than 140 mg/dL      Peak postprandial:   less than 180 mg/dL (1-2 hours)      Critically ill patients:  140 - 180 mg/dL   Results for [name], [name] (MRN [id number]) as of 02/07/2021 11:13  Ref. Range 02/06/2021 09:05 02/06/2021 12:34 02/06/2021 17:16 02/06/2021 21:07 02/07/2021 08:08  Glucose-Capillary Latest Ref Range: 70 - 99 mg/dL 572 (H) 620 (H) 355 (H) 298 (H) 232 (H)   Review of Glycemic Control  Diabetes history: DM2 Outpatient Diabetes medications: Lantus 70 units QAM Current orders for Inpatient glycemic control: Semglee 35 units daily, Novolog 0-9 units TID with meals   Inpatient Diabetes Program Recommendations:     Insulin: Please consider adding Novolog 5 units TID with meals for meal coverage if patient eats at least 50% of meals. Also, please consider adding Novolog 0-5 units QHS for bedtime correction.   Thanks, Orlando Penner, RN, MSN, CDE Diabetes Coordinator Inpatient Diabetes Program [phone] (Team Pager from 8am to 5pm)
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
## 2021-02-07 NOTE — Progress Notes (Signed)
FPTS Brief Progress Note  S: Pt was sleeping in bed, I did not wake the patient.   O: BP 123/62 (BP Location: Right Arm)   Pulse 78   Temp 98.9 F (37.2 C) (Oral)   Resp 20   Ht 5\' 5"  (1.651 m)   Wt (!) 142.8 kg   SpO2 95%   BMI 52.40 kg/m   General: No acute distress, sleeping comfortably in bed Resp: Normal work of breathing   A/P: Plan per day team -Trazodone 100 mg per psych -Follow-up blood and wound culture - Orders reviewed. Labs for AM not ordered, which was adjusted as needed.   , MD 02/07/2021, 10:32 PM PGY-1, [facility] Family Medicine Night Resident  Please page [phone] with questions.

## 2021-02-08 ENCOUNTER — Other Ambulatory Visit (HOSPITAL_COMMUNITY): Payer: Self-pay

## 2021-02-08 DIAGNOSIS — F6089 Other specific personality disorders: Secondary | ICD-10-CM

## 2021-02-08 DIAGNOSIS — I890 Lymphedema, not elsewhere classified: Secondary | ICD-10-CM

## 2021-02-08 DIAGNOSIS — L03119 Cellulitis of unspecified part of limb: Secondary | ICD-10-CM | POA: Diagnosis not present

## 2021-02-08 LAB — CBC
HCT: 35.7 % — ABNORMAL LOW (ref 36.0–46.0)
Hemoglobin: 11.6 g/dL — ABNORMAL LOW (ref 12.0–15.0)
MCH: 28.3 pg (ref 26.0–34.0)
MCHC: 32.5 g/dL (ref 30.0–36.0)
MCV: 87.1 fL (ref 80.0–100.0)
Platelets: 288 10*3/uL (ref 150–400)
RBC: 4.1 MIL/uL (ref 3.87–5.11)
RDW: 15.2 % (ref 11.5–15.5)
WBC: 8.4 10*3/uL (ref 4.0–10.5)
nRBC: 0 % (ref 0.0–0.2)

## 2021-02-08 LAB — GLUCOSE, CAPILLARY
Glucose-Capillary: 196 mg/dL — ABNORMAL HIGH (ref 70–99)
Glucose-Capillary: 224 mg/dL — ABNORMAL HIGH (ref 70–99)

## 2021-02-08 MED ORDER — GABAPENTIN 300 MG PO CAPS: 600.0000 mg | ORAL_CAPSULE | Freq: Two times a day (BID) | ORAL | Status: DC

## 2021-02-08 MED ORDER — PENTIPS 32G X 4 MM MISC
1.0000 | Freq: Every day | 0 refills | Status: DC
  Filled 2021-02-08: qty 30, fill #0

## 2021-02-08 MED ORDER — INSULIN GLARGINE 100 UNIT/ML SOLOSTAR PEN
35.0000 [IU] | PEN_INJECTOR | SUBCUTANEOUS | 3 refills | Status: DC
  Filled 2021-02-08: qty 9, 25d supply, fill #0

## 2021-02-08 MED ORDER — ASPIRIN 81 MG PO TBEC
81.0000 mg | DELAYED_RELEASE_TABLET | Freq: Every day | ORAL | 0 refills | Status: AC
  Filled 2021-02-08: qty 30, 30d supply, fill #0

## 2021-02-08 MED ORDER — TRAZODONE HCL 100 MG PO TABS
100.0000 mg | ORAL_TABLET | Freq: Every day | ORAL | 0 refills | Status: DC
  Filled 2021-02-08: qty 30, 30d supply, fill #0

## 2021-02-08 MED ORDER — DOXYCYCLINE HYCLATE 100 MG PO TABS
100.0000 mg | ORAL_TABLET | Freq: Two times a day (BID) | ORAL | 0 refills | Status: AC
  Filled 2021-02-08: qty 7, 4d supply, fill #0

## 2021-02-08 MED ORDER — AMOXICILLIN-POT CLAVULANATE 875-125 MG PO TABS
1.0000 | ORAL_TABLET | Freq: Two times a day (BID) | ORAL | 0 refills | Status: AC
  Filled 2021-02-08: qty 7, 4d supply, fill #0

## 2021-02-08 NOTE — Progress Notes (Signed)
Discharge instructions was reviewed with patient and husband who is at bedside. Wound dressing education also taught to the both of them. They verbalized understanding with positive teach-back, all questions were answered. Wound dressing supplies given to the patient.

## 2021-02-08 NOTE — Care Management Important Message (Signed)
Important Message  Patient Details  Name: [name] MRN: [id number] Date of Birth: May 08, 1951   Medicare Important Message Given:  Yes     Dorena Bodo 02/08/2021, 4:47 PM

## 2021-02-08 NOTE — Progress Notes (Addendum)
Patient refuses to the stat EKG at this time, education provided. She verbalized that she will do it when she gets back in bed. Will notify provider.  17:30 EKG done.

## 2021-02-08 NOTE — Consult Note (Signed)
Psychiatry C/L Face-to-Face Progress Note  Reason for Consult: Medication recommendations for mood disorder Referring Physician: Darral Dash, DO Patient Identification: [name] MRN:  [id number] Principal Diagnosis: Wound infection Diagnosis:  Principal Problem:   Wound infection Active Problems:   Primary insomnia   Adjustment disorder   Cluster B personality disorder (HCC)  Total Time spent with patient: 15 minutes   Subjective: [name] is a 70 year old female with a psychiatric history of depression, anxiety, primary insomnia, mild cognitive disorder, and self-reported bipolar disorder, and a medical history of uncontrolled T2DM C/B neuropathy and bilateral foot ulcers admitted for ulcer pain and cellulitis and consulted to the psychiatric service for medication recommendations for mood disorder. This morning, she endorses feeling a lot better. She was able to get more than 3 hours of sleep last night with the Trazodone and does not feel irritable. Her appetite is intact, and she has no complaints this morning. She denies SI/HI/AVH.  Treatment Plan Summary: [name] endorses a great improvement after resting last night; she is not feeling irritable this morning. She was agreeable to continuing the current dose of medication. Although she has a reported history of bipolar disorder, we are led to believe that her history is more consistent with cluster B personality traits. She would benefit from outpatient psychiatry.    Recommendations: - Continue Trazodone 100 mg qHS for insomnia - Recommend outpatient therapy and medication management   Disposition:  Defer to primary team for disposition.    Patient is determined to be psychiatrically stable at this time. Psychiatry will sign off. Please do not hesitate to call back if questions arise. Thank you for this consult.    Lamar Sprinkles, MD PGY-1 02/08/2021 [facility] Department of Psychiatry

## 2021-02-08 NOTE — Discharge Summary (Addendum)
[facility] Discharge Summary  Patient name: [name] Medical record number: [id number] Date of birth: 04-10-1951 Age: 70 y.o. Gender: female Date of Admission: 02/05/2021  Date of Discharge: 02/08/21 Admitting Physician: Kinnie Feil, MD  Primary Care Provider: Chaney Malling, [place] Consultants: Psychiatry  Indication for Hospitalization: Lymphedema with overlying cellulitis in b/l lower extremities  Discharge Diagnoses/Problem List:  Principal Problem:   Wound infection Active Problems:   Primary insomnia   Adjustment disorder   Cluster B personality disorder ([place])   Disposition: Home with [facility]  Discharge Condition: Stable  Discharge Exam:  Blood pressure 127/79, pulse 90, temperature 98.1 F (36.7 C), temperature source Oral, resp. rate 20, height $RemoveBe'5\' 5"'qsXfmlOEl$  (1.651 m), weight (!) 142.8 kg, SpO2 98 %.   General: Morbidly obese sitting on the edge of the bed eating her breakfast Cardiovascular: Regular rate and rhythm Respiratory: Normal work of breathing, good aeration throughout all lung fields Abdomen: Obese, nondistended Extremities: Bilateral legs and feet wrapped in gauze and Ace bandages with no purulence, drainage, or blood.  Mildly swollen left greater than right.  No odor appreciated.  No Neuro: Awake, alert, oriented x3.  Speech clear and fluent.  Normal mood and affect  Brief Hospital Course:  [name] is a 70 year old female admitted for bilateral lower extremity infection.  Past medical history of type 2 diabetes, hyperlipidemia, hypertension, H/O TIA, anxiety, gout, osteoarthritis.  Hospital course is outlined below.  Bilateral lower extremity wound infection likely cellulitis [name] is a 70 yr old patient who presents today for bilateral chronic wounds have worsened over the last week. Patient was reported to have concern for failed outpatient abx with Keflex for these wounds, pt was unsure if she completed the  course.  In the ED, CBC was unremarkable with WBC 7.7 and patient remained afebrile throughout admission. Lactic acid 1.4>2.0>1.5, on CMP glucose was 257, electrolytes were wnl. ABI of the left and right extremities were performed on 12/15/2018 showing no evidence of significant lower extremity arterial disease. Xrays were obtained of bilateral feet. Xray of the left foot showed no radiographic evidence of osseous destruction in the foot, no soft tissue gas. Right foot xray showed ulceration about the heel without signs of frank bony destruction or acute bony abnormality with soft tissue swelling in a generalized fashion throughout the foot. Initially she was started on Vanco and Zosyn IV but was transitioned to p.o. Doxy and Augmentin.  MRI of the left foot was obtained and showed no evidence of osteomyelitis.  MRI of the right foot could not be obtained due to combative nature of the patient.  Wound cultures have shown Klebsiella, diphtheroids, blood cultures have shown no growth at 3 days.  Legs were elevated throughout admission using pillows.  Patient was hemodynamically stable and afebrile at time of discharge.   PCP Issues for Follow Up:   Wound culture grew Klebsiella. Discharged on Augmentin.  Continue to educate and encourage wound care with patient and family.  Please consider stopping Atenolol as it is associated with increased risk of death.  Would also consider discontinuing nifedipine given increased risk of edema.  Unclear why she is on 325 mg of ASA. We changed her to 81 mg daily. Please adjust as deemed appropriate during outpatient follow up.  Seen by Psychiatrist while inpatient. 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  Flexeril was discontinued as Trazodone was started.  Discharged on 35U of Lantus as this  is what she was taking inpatient with adequate glucose control. Titrate up outpatient as needed.      Significant Procedures:  None  Significant Labs and Imaging:  Recent Labs  Lab 02/06/21 0451 02/07/21 0118 02/08/21 0128  WBC 8.1 7.9 8.4  HGB 12.2 11.2* 11.6*  HCT 39.5 35.1* 35.7*  PLT 303 301 288   Recent Labs  Lab 02/05/21 1500 02/06/21 0451  NA 134* 136  K 3.7 4.5  CL 102 103  CO2 25 24  GLUCOSE 257* 290*  BUN 8 6*  CREATININE 0.80 0.88  CALCIUM 9.3 9.4  ALKPHOS 103 90  AST 14* 13*  ALT 13 11  ALBUMIN 3.0* 2.7*    Results/Tests Pending at Time of Discharge: None  Discharge Medications:  Allergies as of 02/08/2021       Reactions   Ace Inhibitors Swelling   Tizanidine    Metformin And Related Other (See Comments)   CHILLS   Propofol Itching        Medication List     STOP taking these medications    aspirin 325 MG tablet Replaced by: aspirin EC 81 MG tablet   cephALEXin 500 MG capsule Commonly known as: KEFLEX   cyclobenzaprine 5 MG tablet Commonly known as: FLEXERIL   diclofenac sodium 1 % Gel Commonly known as: Voltaren   Diclofenac Sodium CR 100 MG 24 hr tablet Commonly known as: Voltaren-XR   escitalopram 10 MG tablet Commonly known as: Lexapro   NIFEdipine 90 MG 24 hr tablet Commonly known as: PROCARDIA XL/NIFEDICAL-XL       TAKE these medications    Accu-Chek FastClix Lancets Misc 1 each. Check blood sugar once daily   Accu-Chek Nano SmartView w/Device Kit 1 each See admin instructions.   Accu-Chek SmartView test strip Generic drug: glucose blood 1 each 4 (four) times daily. for testing   acetaminophen 500 MG tablet Commonly known as: TYLENOL Take 500 mg by mouth daily as needed for moderate pain.   albuterol 108 (90 Base) MCG/ACT inhaler Commonly known as: VENTOLIN HFA Inhale 2 puffs into the lungs every 4 (four) hours as needed for wheezing or shortness of breath.   allopurinol 300 MG tablet Commonly known as: ZYLOPRIM Take 300 mg by mouth daily.   amoxicillin-clavulanate 875-125 MG tablet Commonly known as: AUGMENTIN Take 1  tablet by mouth every 12 (twelve) hours for 3 days.   aspirin EC 81 MG tablet Take 1 tablet (81 mg total) by mouth daily. Swallow whole. Replaces: aspirin 325 MG tablet   atenolol 25 MG tablet Commonly known as: TENORMIN Take 25-50 mg by mouth See admin instructions. Take 50 mg every morning and then 25 mg every evening   doxycycline 100 MG tablet Commonly known as: VIBRA-TABS Take 1 tablet (100 mg total) by mouth every 12 (twelve) hours for 3 days.   furosemide 20 MG tablet Commonly known as: Lasix Take 1 tablet (20 mg total) by mouth daily. What changed: how much to take   gabapentin 300 MG capsule Commonly known as: NEURONTIN Take 2 capsules (600 mg total) by mouth 2 (two) times daily.   hydrocerin Crea Apply 1 application topically daily. What changed:  when to take this reasons to take this   HYDROcodone-acetaminophen 7.5-325 MG tablet Commonly known as: NORCO Take 1 tablet by mouth every 6 (six) hours as needed for moderate pain.   insulin glargine 100 UNIT/ML Solostar Pen Commonly known as: LANTUS Inject 35 Units into the skin every morning. And pen  needles 1/day What changed: how much to take   NovoFine 32G X 6 MM Misc Generic drug: Insulin Pen Needle 1 each as needed.   polyethylene glycol 17 g packet Commonly known as: MIRALAX / GLYCOLAX Take 17 g by mouth daily as needed for moderate constipation.   potassium chloride 10 MEQ tablet Commonly known as: KLOR-CON Take 1 tablet (10 mEq total) by mouth daily.   protein supplement shake Liqd Commonly known as: PREMIER PROTEIN Take 325 mLs (11 oz total) by mouth daily.   traZODone 100 MG tablet Commonly known as: DESYREL Take 1 tablet (100 mg total) by mouth at bedtime.        Discharge Instructions: Please refer to Patient Instructions section of EMR for full details.  Patient was counseled important signs and symptoms that should prompt return to medical care, changes in medications, dietary  instructions, activity restrictions, and follow up appointments.    Orvis Brill, DO 02/08/2021, 12:36 PM PGY-1, [place]

## 2021-02-08 NOTE — Progress Notes (Signed)
Family Medicine Teaching Service Daily Progress Note Intern Pager: [phone]  Patient name: [name] Medical record number: [id number] Date of birth: [birth year] Age: 70 y.o. Gender: female  Primary Care Provider: Maretta Bees, PA Consultants: Psychiatry Code Status: Full  Pt Overview and Major Events to Date:  8/23-admitted for lymphedema with overlying cellulitis bilateral lower extremities  Assessment and Plan: [name] is a 70 year old female with worsening chronic bilateral wound infections, admitted for failure of outpatient oral antibiotics for lymphedema with overlying cellulitis in bilateral lower extremities.  Past medical history significant for uncontrolled type 2 diabetes mellitus insulin-dependent, lymphedema with noncompliance with outpatient treatment, bilateral chronic venous hypertension, hypertension, wheelchair-bound, GERD, dementia, depression.  Bilateral lower extremity venous stasis, lymphedema with overlying cellulitis Remains afebrile, with no systemic symptoms or signs of infection.  Continued pain in lower extremities.  Blood culture with no growth at 3 days.  Right leg wound culture grew moderate Klebsiella and abundant diphtheria voids.  Left leg wound culture grew multiple organisms, none predominant. -Continue p.o. antibiotics (doxycycline and Augmentin) day #3 of 7 (8/20 3-01/2028)  Memory impairment, unclear if due to underlying mood disorder or dementia Psychiatry consulted and evaluated patient yesterday, recommended trial of starting trazodone nightly.  Patient slept well overnight and did not have any episodes of agitation or combativeness.  We will discuss with husband and son regarding returning home with home health and having their assistance and continue to support her and providing wound care.  Denies SI/HI this morning. -Repeat [facility] outpatient -Psychiatry following appreciate recommendations  FEN/GI: Carb modified PPx:  Lovenox Dispo:Pending PT recommendations  pending clinical improvement . Barriers include disposition decision.   Subjective:  [name] has no complaints this morning.  No nausea, vomiting, diarrhea.  She was eating her breakfast.  She says she slept well last night after adding the trazodone.  Objective: Temp:  [98 F (36.7 C)-98.9 F (37.2 C)] 98.1 F (36.7 C) (08/26 0900) Pulse Rate:  [66-90] 90 (08/26 0900) Resp:  [18-20] 20 (08/26 0900) BP: (97-134)/(54-79) 127/79 (08/26 0900) SpO2:  [92 %-98 %] 98 % (08/26 0900) Physical Exam: General: Morbidly obese sitting on the edge of the bed eating her breakfast Cardiovascular: Regular rate and rhythm Respiratory: Normal work of breathing, good aeration throughout all lung fields Abdomen: Obese, nondistended Extremities: Bilateral legs and feet wrapped in gauze and Ace bandages with no purulence, drainage, or blood.  Mildly swollen left greater than right.  No odor appreciated.  No Neuro: Awake, alert, oriented x3.  Speech clear and fluent.  Normal mood and affect  Laboratory: Recent Labs  Lab 02/06/21 0451 02/07/21 0118 02/08/21 0128  WBC 8.1 7.9 8.4  HGB 12.2 11.2* 11.6*  HCT 39.5 35.1* 35.7*  PLT 303 301 288   Recent Labs  Lab 02/05/21 1500 02/06/21 0451  NA 134* 136  K 3.7 4.5  CL 102 103  CO2 25 24  BUN 8 6*  CREATININE 0.80 0.88  CALCIUM 9.3 9.4  PROT 8.6* 8.0  BILITOT 0.4 0.6  ALKPHOS 103 90  ALT 13 11  AST 14* 13*  GLUCOSE 257* 290*      Imaging/Diagnostic Tests: No results found.   Darral Dash, DO 02/08/2021, 9:21 AM PGY-1, [facility] Family Medicine FPTS Intern pager: [phone], text pages welcome

## 2021-02-08 NOTE — Progress Notes (Signed)
Physical Therapy Treatment Patient Details Name: METHA KOLASA MRN: [id number] DOB: Feb 28, 1951 Today's Date: 02/08/2021    History of Present Illness 70 yo female admitted with BIL LE ulcer pain with drainage. + cellulitisPMH venous stasis, HTN DM2 morbid obesity wheelchair use gout chronic back pain HLD    PT Comments    Pt pleasant and eager to work with therapies this morning, reports she is anticipating d/c home later today. The pt was able to complete sit-stand and stand-pivot transfer with no physical assist (supervision), but required BUE support on RW to maintain stability in standing. The pt was educated on exercises for ankle ROM, strengthening of LE and postural musculature, and LE power to facilitate transfers. The pt was able to demo with good adherence to cues, and was given handout with all exercises and instructions. The pt was also educated in transfer technique and demos good safety awareness with mobility for home. She is safe to return home with family assist and continued home therapies to maximize independence with mobility.    Follow Up Recommendations  Home health PT;Supervision/Assistance - 24 hour     Equipment Recommendations  Rolling walker with 5" wheels (bariatric)    Recommendations for Other Services       Precautions / Restrictions Precautions Precautions: Fall Precaution Comments: daily dressing change of bil LE per WOC notes by RN staff Restrictions Weight Bearing Restrictions: No    Mobility  Bed Mobility Overal bed mobility: Modified Independent             General bed mobility comments: pt sitting EOB upon arrival    Transfers Overall transfer level: Needs assistance Equipment used: Rolling walker (2 wheeled) Transfers: Sit to/from [employer] Sit to Stand: Supervision Stand pivot transfers: Supervision       General transfer comment: supervision with RW. pt able to complete from elevated EOB as well as x3  from recliner. reliant on use of UE  Ambulation/Gait Ambulation/Gait assistance: Supervision Gait Distance (Feet): 3 Feet Assistive device: Rolling walker (2 wheeled) Gait Pattern/deviations: Step-to pattern Gait velocity: decreased   General Gait Details: pt able to take small lateral steps with RW to complete pivot. reports she utilizes a WC for mobility at baseline     Balance Overall balance assessment: Mild deficits observed, not formally tested                                          Cognition Arousal/Alertness: Awake/alert Behavior During Therapy: WFL for tasks assessed/performed Overall Cognitive Status: Within Functional Limits for tasks assessed                                        Exercises General Exercises - Lower Extremity Ankle Circles/Pumps: AROM;Both;10 reps;Seated (x10 ankle circles in each direction, then x15 ankle pumps) Long Arc Quad: AROM;Both;10 reps;Seated (with 5 sec hold) Other Exercises Other Exercises: sit-stand from reclienr x3 with UE support on RW Other Exercises: standing scapular retraction x10    General Comments General comments (skin integrity, edema, etc.): VSS, pt with LE wounds wrapped      Pertinent Vitals/Pain Pain Assessment: Faces Faces Pain Scale: Hurts a little bit Pain Location: BLE on top of ankle, low back Pain Descriptors / Indicators: Aching;Sore Pain Intervention(s): Monitored during session;Limited activity within patient's  tolerance;Repositioned     PT Goals (current goals can now be found in the care plan section) Acute Rehab PT Goals Patient Stated Goal: to get wound wraps changed daily PT Goal Formulation: With patient Time For Goal Achievement: 02/20/21 Potential to Achieve Goals: Good Progress towards PT goals: Progressing toward goals    Frequency    Min 3X/week      PT Plan Current plan remains appropriate       AM-PAC PT "6 Clicks" Mobility   Outcome  Measure  Help needed turning from your back to your side while in a flat bed without using bedrails?: A Little Help needed moving from lying on your back to sitting on the side of a flat bed without using bedrails?: A Little Help needed moving to and from a bed to a chair (including a wheelchair)?: A Little Help needed standing up from a chair using your arms (e.g., wheelchair or bedside chair)?: A Little Help needed to walk in hospital room?: A Lot Help needed climbing 3-5 steps with a railing? : Total 6 Click Score: 15    End of Session   Activity Tolerance: Patient tolerated treatment well Patient left: with call bell/phone within reach;in chair Nurse Communication: Mobility status PT Visit Diagnosis: Muscle weakness (generalized) (M62.81);Difficulty in walking, not elsewhere classified (R26.2)     Time: 4540-9811 PT Time Calculation (min) (ACUTE ONLY): 27 min  Charges:  $Therapeutic Exercise: 8-22 mins $Therapeutic Activity: 8-22 mins                     Lazarus Gowda, PT, DPT   Acute Rehabilitation Department Pager #: [phone]   Ronnie Derby 02/08/2021, 10:19 AM

## 2021-02-10 LAB — CULTURE, BLOOD (ROUTINE X 2)
Culture: NO GROWTH
Culture: NO GROWTH

## 2021-02-11 LAB — AEROBIC CULTURE W GRAM STAIN (SUPERFICIAL SPECIMEN): Gram Stain: NONE SEEN

## 2021-02-12 LAB — AEROBIC CULTURE W GRAM STAIN (SUPERFICIAL SPECIMEN)

## 2021-02-13 ENCOUNTER — Encounter (HOSPITAL_BASED_OUTPATIENT_CLINIC_OR_DEPARTMENT_OTHER): Payer: Medicare PPO | Admitting: Physician Assistant

## 2021-02-13 ENCOUNTER — Other Ambulatory Visit: Payer: Self-pay

## 2021-02-13 DIAGNOSIS — Z1231 Encounter for screening mammogram for malignant neoplasm of breast: Secondary | ICD-10-CM

## 2021-02-20 ENCOUNTER — Encounter (HOSPITAL_BASED_OUTPATIENT_CLINIC_OR_DEPARTMENT_OTHER): Payer: Medicare PPO | Attending: Physician Assistant | Admitting: Physician Assistant

## 2021-02-20 ENCOUNTER — Other Ambulatory Visit: Payer: Self-pay

## 2021-02-20 DIAGNOSIS — L97822 Non-pressure chronic ulcer of other part of left lower leg with fat layer exposed: Secondary | ICD-10-CM | POA: Diagnosis not present

## 2021-02-20 DIAGNOSIS — L97522 Non-pressure chronic ulcer of other part of left foot with fat layer exposed: Secondary | ICD-10-CM | POA: Insufficient documentation

## 2021-02-20 DIAGNOSIS — I87332 Chronic venous hypertension (idiopathic) with ulcer and inflammation of left lower extremity: Secondary | ICD-10-CM | POA: Insufficient documentation

## 2021-02-20 DIAGNOSIS — R531 Weakness: Secondary | ICD-10-CM | POA: Diagnosis not present

## 2021-02-20 DIAGNOSIS — I890 Lymphedema, not elsewhere classified: Secondary | ICD-10-CM | POA: Diagnosis not present

## 2021-02-20 DIAGNOSIS — E11622 Type 2 diabetes mellitus with other skin ulcer: Secondary | ICD-10-CM | POA: Insufficient documentation

## 2021-02-20 DIAGNOSIS — L97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 leg with fat layer exposed: Secondary | ICD-10-CM | POA: Diagnosis not present

## 2021-02-20 DIAGNOSIS — Z833 Family history of diabetes mellitus: Secondary | ICD-10-CM | POA: Diagnosis not present

## 2021-02-20 DIAGNOSIS — I87331 Chronic venous hypertension (idiopathic) with ulcer and inflammation of right lower extremity: Secondary | ICD-10-CM | POA: Insufficient documentation

## 2021-02-20 DIAGNOSIS — Z6841 Body Mass Index (BMI) 40.0 and over, adult: Secondary | ICD-10-CM | POA: Insufficient documentation

## 2021-02-20 DIAGNOSIS — E1151 Type 2 diabetes mellitus with diabetic peripheral angiopathy without gangrene: Secondary | ICD-10-CM | POA: Diagnosis not present

## 2021-02-20 DIAGNOSIS — F418 Other specified anxiety disorders: Secondary | ICD-10-CM | POA: Diagnosis not present

## 2021-02-20 DIAGNOSIS — I10 Essential (primary) hypertension: Secondary | ICD-10-CM | POA: Diagnosis not present

## 2021-02-20 DIAGNOSIS — E11621 Type 2 diabetes mellitus with foot ulcer: Secondary | ICD-10-CM | POA: Insufficient documentation

## 2021-02-20 NOTE — Progress Notes (Addendum)
[name] ([id number]) Visit Report for 02/20/2021 Chief Complaint Document Details Patient Name: Date of Service: [name] 02/20/2021 2:45 PM Medical Record Number: [id number] Patient Account Number: [id number] Date of Birth/Sex: Treating RN: 09-07-1950 (70 y.o. [name] Primary Care Provider: [name] Other Clinician: Referring Provider: Treating Provider/Extender: Sallye Ober, Loree Fee Weeks in Treatment: [place] from: Patient Chief Complaint Bilateral reoccurring LE ulcers Electronic Signature(s) Signed: 02/20/2021 2:59:49 PM By: [name] PA-C Entered By: [name] on 02/20/2021 14:59:48 -------------------------------------------------------------------------------- HPI Details Patient Name: Date of Service: [name]. 02/20/2021 2:45 PM Medical Record Number: [id number] Patient Account Number: [id number] Date of Birth/Sex: Treating RN: [birth year] (70 y.o. [name] Primary Care Provider: [name] Other Clinician: Referring Provider: Treating Provider/Extender: Sallye Ober, Loree Fee Weeks in Treatment: 185 History of Present Illness HPI Description: this patient has been seen a couple of times before and returns with recurrent problems to her right and left lower extremity with swelling and weeping ulcerations due to not wearing her compression stockings which she had been advised to do during her last discharge, at the end of June 2018. During her last visit the patient had had normal arterial blood flow and her venous reflux study did not necessitate any surgical intervention. She was recommended compression and elevation and wound care. After prolonged treatment the patient was completely healed but she has been noncompliant with wearing or compressions.. She was here last week with an outpatient return visit planned but the patient came in a very poor general condition with  altered mental status and was rushed to the ER on my request. With a history of hypertension, diabetes, TIA and right-sided weakness she was set up for an MRI on her brain and cervical spine and was sent to [facility]. Getting an MRI done was very difficult but once the workup was done she was found not to have any spinal stenosis, epidural abscess or hematoma or discitis. This was radiculopathy to be treated as an outpatient and she was given a follow-up appointment. Today she is feeling much better alert and oriented and has come to reevaluate her bilateral lower extremity lymphedema and ulceration 03/25/2017 -- she was admitted to the hospital on 03/16/2017 and discharged on 03/18/2017 with left leg cellulitis and ulceration. She was started on vancomycin and Zosyn and x-ray showed no bony involvement. She was treated for a cellulitis with IV antibiotics changed to Rocephin and Flagyl and was discharged on oral Keflex and doxycycline to complete a 7 day course. Last hemoglobin A1c was 7.1 and her other ailments including hypertension got asthma were appropriately treated. 05/06/2017 -- she is awaiting the right size of compression stockings from [place] but other than that has been doing well. ====== Old notes 70 year old patient was seen one time last October and was lost to follow-up. She has recurrent problems with weeping and ulceration of her left lower extremity and has swelling of this for several years. It has been worse for the last 2 months. Past medical history is significant for diabetes mellitus type 2, hypertension, gout, morbid obesity, depressive disorders, hiatal hernia, migraines, status post knee surgery, risk of a cholecystectomy, vaginal hysterectomy and breast biopsy. She is not a smoker. As noted before she has never had a venous duplex study and an arterial ABI study was attempted but the left lower extremity was noncompressible 10/01/2016 -- had a lower extremity venous  duplex reflux evaluation which showed no evidence  of deep vein reflux in the right or left lower extremity, and no evidence of great saphenous vein reflux more than 500 ms in the right or left lower extremity, and the left small saphenous vein is incompetent but no vascular consult was recommended. review of her electronic medical records noted that the ABI was checked in July 2017 where the right ABI was normal limits and the left ABI could not be ascertained due to pain with cuff pressure but the waveforms are within normal limits. her arterial duplex study scheduled for April 27. 10/08/2016 -- the patient has various reasons for not having a compression on and for the last 3 days she has had no compression on her left lower extremity either due to pain or the lack of nursing help. She does not use her juxta lites either. 10/15/2016 -- the patient did not keep her appointment for arterial duplex study on April 27 and I have asked her to reschedule this. Her pain is out of proportion with the physical findings and she continuously fails to wear a compression wraps and cuts them off because she says she cannot tolerate the pain. She does not use her juxta lites either. 10/22/2016 -- he has rescheduled her arterial duplex study to May 21 and her pain today is a bit better. She has not been wearing her juxta lites on her right lower extremity but now understands that she needs to do this. She did tolerate the to press compression wrap on her left lower extremity 10/29/2016 --arterial duplex study is scheduled for next week and overall she has been tolerating her compression wraps and also using her juxta lites on her right lower extremity 11/05/2016 -- the right ABI was 0.95 the left was 1.03. The digit TBI is on the right was 0.83 on the left was 0.92 and she had biphasic flow through these vessels. The impression was that of normal lower extremity arterial study. 11/12/2016 -- her pain is minimal  and she is doing very well overall. 11/26/2016 -- she has got juxta lites and her insurance will not pay for additional dual layer compression stockings. She is going to order some from [place]. 05/12/2017 -- her juxta lites are very old and too big for her and these have not been helping with compression. She did get 20-30 mm compression stockings from [place] but she and her husband are unable to put these on. I believe she will benefit from bilateral Extremit-ease, compression stockings and we will measure her for these today. 05/20/2017 -- lymphedema on the left lower extremity has increased a lot and she has a open ulceration as a result of this. The right lower extremity is looking pretty good. She has decided to by the compression stockings herself and will get reimbursed by the home health, at a later date. 05/27/2017 -- her sciatica is bothering her a lot and she thought her left leg pain was caused due to the compression wrap and hence removed it and has significant lymphedema. There is no inflammation on this left lower extremity. 06/17/17 on evaluation today patient appears to be doing very well and in fact is completely healed in regard to her ulcerations. Unfortunately however she does have continued issues with lymphedema nonetheless. We did order compression garments for her unfortunately she states that the size that she received were large although we ordered medium. Obviously this means she is not getting the optimal compression. She does not have those with her today and therefore we could  not confirm and contact the company on her behalf. Nonetheless she does state that she is going to have her husband bring them by tomorrow so that we can verify and then get in touch with the company. No fevers, chills, nausea, or vomiting noted at this time. Overall patient is doing better otherwise and I'm pleased with the progress she has made. 07/01/17 on evaluation today patient appears to be  doing very well in regard to her bilateral lower extremity she does not have any openings at this point which is excellent news. Overall I'm pleased with how things have progressed up to this time. Since she is doing so well we did order her compression which we are seeing her today to ensure that it fits her properly and everything is doing well in that regard and then subsequently she will be discharged. ============ Old Notes: 03/31/16 patient presents today for evaluation concerning open wounds that she has over the left medial ankle region as well as the left dorsal foot. She has previously had this occur although it has been healed for a number of months after having this for about a year prior until her hospitalization on 01/05/16. At that point in time it appears that she was admitted to the hospital for left lower extremity cellulitis and was placed on vancomycin and Zosyn at that point. Eventually upon discharge on January 15, 2016 she was placed on doxycycline at that point in time. Later on 03/27/16 positive wound culture growing Escherichia coli this was switched to amoxicillin. Currently she tells me that she is having pain radiated to be a 7 out of 10 which can be as high as 10 out of 10 with palpation and manipulation of the wound. This wound appears to be mainly venous in nature due to the bilateral lower extremity venous stasis/lymphedema. This is definitely much worse on her left than the right side. She does have type 1 diabetes mellitus, hypertension, morbid obesity, and is wheelchair dependent.during the course of the hospital stay a blood culture was also obtained and fortunately appeared negative. She also had an x-ray of the tibia/fibula on the left which showed no acute bone abnormality. Her white blood cell count which was performed last on 03/25/16 was 7.3, hemoglobin 12.8, protein 7.1, albumin 3.0. Her urine culture appeared to be negative for any specific organisms. Patient  did have a left lower extremity venous duplex evaluation for DVT . This did not include venous reflux studies but fortunately was negative for DVT Patient also had arterial studies performed which revealed that she had a . normal ABI on the right though this was unable to be performed on the left secondary to pain that she was having around the ankle region due to the wound. However it was stated on report that she had biphasic pulses and apparently good blood flow. ========== 06/03/17 she is here in follow-up evaluation for right lower extremity ulcer. The right lower sure he has healed but she has reopened to the left medial malleolus and dorsal foot with weeping. She is waiting for new compression garments to arrive from home health, the previous compression garments were ill fitting. We will continue with compression bilaterally and follow-up in 2 weeks Readmission: 08/05/17 on evaluation today patient appears to be doing somewhat poorly in regard to her left lower extremity especially although the right lower extremity has a small area which may no longer be open. She has been having a lot of drainage from the left lower  extremity however he tells me that she has not been able to use the EXTREMIT-EASE Compression at this point. She states that she did better and was able to actually apply the Juxta-Lite compression although the wound that she has is too large and therefore really does not compress which is why she cannot wear it at this point. She has no one who can help her put it on regular basis her son can sometimes but he's not able to do it most of the time. I do believe that's why she has begun to weave and have issues as she is currently yet again. No fevers, chills, nausea, or vomiting noted at this time. Patient is no evidence of dementia. 08/12/17 on evaluation today patient appears to still be doing fairly well in regard to the draining areas/weeping areas at this point. With that  being said she unfortunately did go to the ER yesterday due to what was felt to be possibly a cellulitis. They place her on doxycycline by mouth and discharge her home. She definitely was not admitted. With that being said she states she has had more discomfort which has been unusual for her even compared to prior times and she's had infections.08/12/17 on evaluation today patient appears to still be doing fairly well in regard to the draining areas/weeping areas at this point. With that being said she unfortunately did go to the ER yesterday due to what was felt to be possibly a cellulitis. They place her on doxycycline by mouth and discharge her home. She definitely was not admitted. With that being said she states she has had more discomfort which has been unusual for her even compared to prior times and she's had infections. 08/19/17 put evaluation today patient tells me that she's been having a lot of what sounds to be neuropathic type pain in regard to her left lower extremity. She has been using over-the-counter topical bins again which some believe. That in order to apply the she actually remove the wrap we put on her last Wednesday on Thursday. Subsequently she has not had anything on compression wise since that time. The good news is a lot of the weeping areas appear to have closed at this point again I believe she would do better with compression but we are struggling to get her to actually use what she needs to at this point. No fevers, chills, nausea, or vomiting noted at this time. 09/03/17 on evaluation today patient appears to be doing okay in regard to her lower extremities in regard to the lymphedema and weeping. Fortunately she does not seem to show any signs of infection at this point she does have a little bit of weeping occurring in the right medial malleolus area. With that being said this does not appear to be too significant which is good news. 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 She has severe skin damage secondary to both of these features involving the dorsal left foot and medial left ankle and lower leg. Still has open areas in the left anterior foot. The area on the right closed over. She uses her own juxta light stockings. She does not have an arterial issue 09/16/17 on evaluation today patient actually appears to be doing excellent in regard to her bilateral lower extremity swelling. The Juxta-Lite compression wrap seem to be doing very well for her. She has not however been using the portion that goes over her foot. Her left  foot still is draining a little bit not nearly as significant as it has been in the past but still I do believe that she likely needs to utilize the full wrap including the foot portion of this will improve as well. She also has been apparently putting on a significant amount of Vaseline which also think is not helpful for her. I recommended that if she feels she needs something for moisturizer Eucerin will probably be better. 09/30/17 on evaluation today patient presents with several new open areas in regard to her left lower extremity although these appear to be minimal and mainly seem to be more moisture breakdown than anything. Fortunately she does not seem to have any evidence of infection which is great news. She has been tolerating the dressing changes without complication we are using silver alginate on the foot she has been using AB pads to have the legs and using her Juxta- Lite compression which seems to be controlling her swelling very well. Overall I'm pleased with the poor way she has progressed. 10/14/17 on evaluation today patient appears to be doing better in regard to her left lower extremity areas of weeping. She does still have some discomfort although in general this does not appear to be as macerated and I think it is progressing nicely. I do think she  still needs to wear the foot portion of her Juxta- Lite in order to get the most benefit from the wrap obviously. She states she understands. Fortunately there does not appear to be evidence of infection at this time which is great news. 10/28/17 on evaluation today patient appears to be doing excellent in regard to her left lower extremity. She has just a couple areas that are still open and seem to be causing any trouble whatsoever. For that reason I think that she is definitely headed in the right direction the spots are very tiny compared to what we have been dealing with in the past. 11/11/17 on evaluation today patient appears to have a right lateral lower extremity ulcer that has opened since I last saw her. She states this is where the home health nurse that was coming out remove the dressing without wetting the alginate first. Nonetheless I do not know if this is indeed the case or not but more importantly we have not ordered home help to be coming out for her wounds at all. I'm unsure as to why they are coming out and we're gonna have to check on this and get things situated in that regard. With that being said we currently really do not need them to be coming out as the patient has been taking care of her leg herself without complication and no issues. In fact she was doing much better prior to nursing coming out. 11/25/17 on evaluation today patient actually appears to be doing fairly well in regard to her left lower extremity swelling. In fact she has very little area of weeping at this point there's just a small spot on the lateral portion of her right leg that still has me just a little bit more concerned as far as wanting to see this clear up before I discharge her to caring for this at home. Nonetheless overall she has made excellent progress. 12/09/17 on evaluation today patient appears to be doing rather well in regard to her lower extremity edema. She does have some weeping still in the  left lower extremity although the big area we were taking care of two weeks ago  actually has closed and she has another area of weeping on the left lower extremity immediately as well is the top of her foot. She does not currently have lymphedema pumps she has been wearing her compression daily on a regular basis as directed. With that being said I think she may benefit from lymphedema pumps. 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 We had a very difficult time getting and keeping this under control. 12/23/17 on evaluation today patient actually appears to be doing a little bit more poorly in regard to her bilateral lower extremities. She has been tolerating the Juxta-Lite compression wraps. Unfortunately she has two new ulcers on the right lower extremity and left lower [place] ulceration seems to be larger. Obviously this is not good news. She has been tolerating the dressings without complication. 12/30/17 on evaluation today patient actually appears to be doing much better in regard to her bilateral lower extremity edema. She continues to have some issues with ulcerations and in fact there appears to be one spot on each leg where the wrap may have caused a little bit of a blister which is subsequently opened up at this point is given her pain. Fortunately it does not appear to be any evidence of infection which is good news. No fevers chills noted. 01/13/18 on evaluation today patient appears to be doing rather well in regard to her bilateral lower extremities. The dressings did get kind of stuck as far as the wound beds are concerned but again I think this is mainly due to the fact that she actually seems to be showing signs of healing which is good news. She's not having as much drainage therefore she was having more of the dressing sticking.  Nonetheless overall I feel like her swelling is dramatically down compared to previous. 01/20/18 on evaluation today patient unfortunately though she's doing better in most regards has a large blister on the left anterior lower extremity where she is draining quite significantly. Subsequently this is going to need debridement today in order to see what's underneath and ensure she does not continue to trapping fluid at this location. Nonetheless No fevers, chills, nausea, or vomiting noted at this time. 01/27/18 on evaluation today patient appears to be doing rather well at this point in regard to her right lower extremity there's just a very small area that she still has open at this point. With that being said I do believe that she is tolerating the compression wraps very well in making good progress. Home health is coming out at this point to see her. Her left lower extremity on the lateral portion is actually what still mainly open and causing her some discomfort for the most part 02/10/18 on evaluation today patient actually appears to be doing very well in regard to her right lower extremity were all the ulcers appear to be completely close. In regard to the left lower extremity she does have two areas still open and some leaking from the dorsal surface of her foot but this still seems to be doing much better to me in general. 02/24/18 on evaluation today patient actually appears to be doing much better in regard to her right lower extremity this is still completely healed. Her left lower extremity is also doing much better fortunately she has no evidence of infection. The one area that  is gonna require some debridement is still on the left anterior shin. Fortunately this is not hurting her as badly today. 03/10/18 on evaluation today patient appears to be doing better in some regards although she has a little bit more open area on the dorsal foot and she also has some issues on the medial portion of  the left lower extremity which is actually new and somewhat deep. With that being said there fortunately does not appear to be any significant signs of infection which is good news. No fevers, chills, nausea, or vomiting noted at this time. In general her swelling seems to be doing fairly well which is good news. 03/31/18 on evaluation today patient presents for follow-up concerning her left lower extremity lymphedema. Unfortunately she has been doing a little bit more poorly since I last saw her in regard to the amount of weeping that she is experiencing. She's also having some increased pain in the anterior shin location. Unfortunately I do not feel like the patient is making such good progress at this point a few weeks back she was definitely doing much better. 04/07/18 on evaluation today patient actually appears to be showing some signs of improvement as far as the left lower extremity is concerned. She has been tolerating the dressing changes and it does appear that the Drawtex did better for her. With that being said unfortunately home health is stating that they cannot obtain the Drawtex going forward. Nonetheless we're gonna have to check and see what they may be able to get the alginate they were using was getting stuck in causing new areas of skin being pulled all that with and subsequently weep and calls her to worsen overall this is the first time we've seen improvement at this time. 04/14/18 on evaluation today patient actually appears to be doing rather well at this point there does not appear to be any evidence of infection at this time and she is actually doing excellent in regard to the weeping in fact she almost has no openings remaining even compared to just last week this is a dramatic improvement. No fevers chills noted 04/21/18 evaluation today patient actually appears to be doing very well. She in fact is has a small area on the posterior lower extremity location and she has  a small area on the dorsal surface of her foot that are still open both of which are very close to closing. We're hoping this will be close shortly. She brought her Juxta-Lite wrap with her today hoping that would be able to put her in it unfortunately I don't think were quite at that point yet but we're getting closer. 04/28/18 upon evaluation today patient actually appears to be doing excellent in regard to her left lower extremity ulcer. In fact the region on the posterior lower extremity actually is much smaller than previously noted. Overall I'm very happy with the progress she has made. She again did bring her Juxta-Lite although we're not quite ready for that yet. 05/11/18 upon evaluation today patient actually appears to be doing in general fairly well in regard to her left lower Trinity. The swelling is very well controlled. With that being said she has a new area on the left anterior lower extremity as well as between the first and second toes of her left foot that was not present during the last evaluation. The region of her posterior left lower extremity actually appears to be almost completely healed. T be honest I'm very pleased with o  the way that stands. Nonetheless I do believe that the lotion may be keeping the area to moist as far as her legs are concerned subsequently I'm gonna consider discontinuing that today. 05/26/18 on evaluation today patient appears to be doing rather well in regard to her left lower should be ulcers. In fact everything appears to be close except for a very small area on the left posterior lower extremity. Fortunately there does not appear to be any evidence of infection at this time. Overall very pleased with her progress. 06/02/18 and evaluation today patient actually appears to be doing very well in regard to her lower extremity ulcers. She has one small area that still continues to weep that I think may benefit her being able to justify lotion and user  Juxta-Lite wraps versus continued to wrap her. Nonetheless I think this is something we can definitely look into at this point. 06/23/18 on evaluation today patient unfortunately has openings of her bilateral lower extremities. In general she seems to be doing much worse than when I last saw her just as far as her overall health standpoint is concerned. She states that her discomfort is mainly due to neuropathy she's not having any other issues otherwise. No fevers, chills, nausea, or vomiting noted at this time. 06/30/18 on evaluation today patient actually appears to be doing a little worse in regard to her right lower extremity her left lower extremity of doing fairly well. Fortunately there is no sign of infection at this time. She has been tolerating the dressing changes without complication. Home health did not come out like they were supposed to for the appropriate wrap changes. They stated that they never received the orders from [place] which were fax. Nonetheless we will send a copy of the orders with the patient today as well. 07/07/18 on evaluation today patient appears to be doing much better in regard to lower extremities. She still has several openings bilaterally although since I last saw her her legs did show obvious signs of infection when she later saw her nurse. Subsequently a culture was obtained and she is been placed on Bactrim and Keflex. Fortunately things seem to be looking much better it does appear she likely had an infection. Again last week we'd even discussed it but again there really was not any obvious sign that she had infection therefore we held off on the antibiotics. Nonetheless I'm glad she's doing better today. 07/14/18 on evaluation today patient appears to be doing much better regarding her bilateral lower Trinity's. In fact on the right lower for me there's nothing open at this point there are some dry skin areas at the sites where she had infection. Fortunately there is  no evidence of systemic infection which is excellent news. No fevers chills noted 07/21/18 on evaluation today patient actually appears to be doing much better in regard to her left lower extremity ulcers. She is making good progress and overall I feel like she's improving each time I see her. She's having no pain I do feel like the infection is completely resolved which is excellent news. No fevers, chills, nausea, or vomiting noted at this time. 07/28/18 on evaluation today patient appears to be doing very well in regard to her left lower [employer]. Everything seems to be showing signs of improvement which is excellent news. Overall very pleased with the progress that has been made. Fortunately there's no evidence of active infection at this time also excellent news. 08/04/18 on evaluation today patient  appears to be doing more poorly in regard to her bilateral lower extremities. She has two new areas open up on the right and these were completely closed as of last week. She still has the two spots on the left which in my pinion seem to be doing better. Fortunately there's no evidence of infection again at this point. 08/11/18 on evaluation today patient actually appears to be doing very well in regard to her bilateral lower Trinity wounds that all seem to be doing better and are measures smaller today. Fortunately there's no signs of infection. No fevers, chills, nausea, or vomiting noted at this time. 08/18/18 on evaluation today patient actually appears to be doing about the same inverter bilateral lower extremities. She continues to have areas that blistering open as was drain that fortunately nothing too significant. Overall I feel like Drawtex may have done better for her however compared to the collagen. 08/25/18 on evaluation today patient appears to be doing a little bit more poorly today even compared to last time I saw her. Again I'm not exactly sure why she's making worse progress over  the past several weeks. I'm beginning to wonder if there is some kind of underlying low level infection causing this issue. I did actually take a culture from the left anterior lower extremity but it was a new wound draining quite a bit at this point. Unfortunately she also seems to be having more pain which is what also makes me worried about the possibility of infection. This is despite never erythema noted at this point. 09/01/18 on evaluation today patient actually appears to be doing a little worse even compared to last week in regard to bilateral lower extremities. She did go to the hospital on the 16th was given a dose of IV Zosyn and then discharged with a recommendation to continue with the Bactrim that I previously prescribed for her. Nonetheless she is still having a lot of discomfort she tells me as well at this time. This is definitely unfortunate. No fevers, chills, nausea, or vomiting noted at this time. 09/08/18 on evaluation today patient's bilateral lower extremities actually appear to be shown signs of improvement which is good news. Fortunately there does not appear to be any signs of active infection I think the anabiotic is helping in this regard. Overall I'm very pleased with how she is progressing. 09/15/18 patient was actually seen in ER yesterday due to her legs as well unfortunately. She states she's been having a lot of pain and discomfort as well as a lot of drainage. Upon inspection today the patient does have a lot of swelling and drainage I feel like this is more related to lymphedema and poor fluid control than it is to infection based on what I'm seeing. The physician in the emergency department also doubted that the patient was having a significant infection nonetheless I see no evidence of infection obvious at this point although I do see evidence of poor fluid control. She still not using a compression pumps, she is not elevating due to her lift chair as well as her  hospital bed being broken, and she really is not keeping her legs up as much as they should be and also has been taking off her wraps. All this combined I think has led to poor fluid control and to be honest she may be somewhat volume overloaded in general as well. I recommend that she may need to contact your physician to see if a prescription for  a diuretic would be beneficial in their opinion. As long as this is safe I think it would likely help her. 09/29/18 on evaluation today patient's left lower extremity actually appears to be doing quite a bit better. At least compared to last time that I saw her. She still has a large area where she is draining from but there's a lot of new skin speckled trout and in fact there's more new skin that there are open areas of weeping and drainage at this point. This is good news. With regard to the right lower extremity this is doing much better with the only open area that I really see being a dry spot on the right lateral ankle currently. Fortunately there's no signs of active infection at this time which is good news. No fevers, chills, nausea, or vomiting noted at this time. The patient seems somewhat stressed and overwhelmed during the visit today she was very lethargic as such. She does and she is not taking any pain medications at this point. Apparently according to her husband are also in the process of moving which is probably taking its toll on her as well. 10/06/18 on evaluation today patient appears to be doing rather well in regard to her lower extremities compared to last evaluation. Fortunately there's no signs of active infection. She tells me she did have an appointment with her primary. Nonetheless he was concerned that the wounds were somewhat deep based on pictures but we never actually saw her legs. She states that he had her somewhat worried due to the fact that she was fearing now that she was [place] have to have an amputation. With that being  said based on what I'm seeing check she looks better this week that she has the last two times I've seen her with much less drainage I'm actually pleased in this regard. That doesn't mean that she's out of the water but again I do not think what the point of talking about education at all in regard to her leg. She is very happy to hear this. She is also not having as much pain as she was having last week. 10/13/18 unfortunately on evaluation today patient still continues to have a significant amount of drainage she's not letting home health actually apply the compression dressings at this point. She's trying to use of Juxta-Lite of the top of Kerlex and the second layer of the three layer compression wrap. With that being said she just does not seem to be making as good a progress as I would expect if she was having the compression applied and in place on a regular basis. No fevers, chills, nausea, or vomiting noted at this time. 10/20/18 on evaluation today patient appears to be doing a little better in regard to her bilateral lower extremity ulcers. In fact the right lower extremity seems to be healed she doesn't even have any openings at this point left lower extremity though still somewhat macerated seems to be showing signs of new skin growth at multiple locations throughout. Fortunately there's no evidence of active infection at this time. No fevers, chills, nausea, or vomiting noted at this time. 10/27/18 on evaluation today patient appears to be doing much better in regard to her left lower Trinity ulcer. She's been tolerating the laptop complication and has minimal drainage noted at this point. Fortunately there's no signs of active infection at this time. No fevers, chills, nausea, or vomiting noted at this time. 11/03/18 on evaluation today patient actually  appears to be doing excellent in regard to her left lower extremity. She is having very little drainage at this point there does not appear  to be any significant signs of infection overall very pleased with how things have gone. She is likewise extremely pleased still and seems to be making wonderful progress week to week. I do believe antibiotics were helpful for her. Her primary care provider did place on amateur clean since I last saw her. 11/17/18 on evaluation today patient appears to be doing worse in regard to her bilateral lower extremities at this point. She is been tolerating the dressing changes without complication. With that being said she typically takes the Coban off fairly quickly upon arriving home even after being seen here in the clinic and does not allow home health reapply command as part of the dressing at home. Therefore she said no compression essentially since I last saw her as best I can tell. With that being said I think it shows and how much swelling she has in the open wounds that are noted at this point. Fortunately there's no signs of infection but unfortunately if she doesn't get this under control I think she will end up with infection and more significant issues. 11/24/18 on evaluation today patient actually appears to be doing somewhat better in regard to her bilateral lower extremities. 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 Nonetheless that is doing better currently so she should be able to attempt reinitiating the lymphedema pumps at this time. No fevers, chills, nausea, or vomiting noted at this time. 12/01/18 upon evaluation today patient's left lower extremity appears to be doing quite well unfortunately her right lower extremity is not doing nearly as well. She has been tolerating the dressing changes without complication unfortunately she did not keep a wrap on the right at this time. Nonetheless I believe this has led to increased swelling and weeping in  the world is actually much larger than during the last evaluation with her. 12/08/18 on evaluation today patient appears to be doing about the same at this point in regard to her right lower extremity. There is some more palatable to touch I'm concerned about the possibility of there being some infection although I think the main issue is she's not keeping her compression wrap on which in turn is not allowing this area to heal appropriately. 12/22/18 on evaluation today patient appears to be doing better in regard to left lower extremity unfortunately significantly worse in regard to the right lower extremity. The areas of blistering and necrotic superficial tissue have spread and again this does not really appear to be signs of infection and all she just doesn't seem to be doing nearly as well is what she has been in the past. Overall I feel like the Augmentin did absolutely nothing for her she doesn't seem to have any infection again I really didn't think so last time either is more of a potential preventative measure and hoping that this would make some difference but I think the main issue is she's not wearing her compression. She tells me she cannot wear the [place] been we put on she takes it off pretty much upon getting home. Subsequently she worshiped Juxta-Lite when I questioned her about how often she wears it this is no more than  three hours a day obviously that leaves 21 hours that she has no compression and this is obviously not doing well for her. Overall I'm concerned that if things continue to worsen she is at great risk of both infection as well as losing her leg. 01/05/19 on evaluation today patient appears to be doing well in regard to her left lower extremity which he is allowing [place] to wrap and not so well with regard to her right lower extremity which she is not allowing [place] to really wrap and keep the wrap on. She states that it hurts too badly whenever it's wrapped and she ends up  having to take it off. She's been using the Juxta-Lite she tells me up to six hours a day although I question whether or not that's really been the case to be honest. Previously she told me three hours today nonetheless obviously the legs as long as the wrap is doing great when she is not is doing much more poorly. 01/12/2019 on evaluation today patient actually appears to be doing a little better in my opinion with regard to her right lower extremity ulcer. She has a small open area on the left lower extremity unfortunately but again this I think is part of the normal fluctuation of what she is going to have to expect with regard to her legs especially when she is not using her lymphedema pumps on a regular basis. Subsequently based on what I am seeing today I think that she does seem to be doing slightly better with regard to her right lower extremity she did see her primary care provider on Monday they felt she had an infection and placed her on 2 antibiotics. Both Cipro and clindamycin. Subsequently again she seems possibly to be doing a little bit better in regards to the right lower extremity she also tells me however she has been wearing the compression wrap over the past week since I spoke with her as well that is a Kerlix and Coban wrap on the right. No fevers, chills, nausea, vomiting, or diarrhea. 01/19/2019 on evaluation today patient appears to be doing better with regard to her bilateral lower extremities especially the right. I feel like the compression has been beneficial for her which is great news. She did get a call from her primary care provider on her way here today telling her that she did have methicillin- resistant Staphylococcus aureus and he was calling in a couple new antibiotics for her including a ointment to be applied she tells me 3 times a day. With that being said this sounds like likely to be Bactroban which I think could be applied with each dressing/wrap change but I  would not be able to accommodate her applying this 3 times a day. She is in agreement with the least doing this we will add that to her orders today. 01/26/2019 on evaluation today patient actually appears to be doing much better with regard to her right lower extremity. Her left lower extremity is also doing quite well all things considering. Fortunately there is no evidence of active infection at this time. No fevers, chills, nausea, vomiting, or diarrhea. 02/02/2019 on evaluation today patient appears to be doing much better compared to her last evaluation. Little by little off like her right leg is returning more towards normal. There does not appear to be any signs of active infection and overall she seems to be doing quite well which is great news. I am very pleased in this  regard. No fevers, chills, nausea, vomiting, or diarrhea. 02/09/2019 upon evaluation today patient appears to be doing better with regard to her bilateral lower extremities. She has been tolerating the dressing changes without complication. Fortunately there is no signs of active infection at this time. No fevers, chills, nausea, vomiting, or diarrhea. 02/23/2019 on evaluation today patient actually appears to be doing quite well with regard to her bilateral lower extremities. She has been tolerating the dressing changes without complication. She is even used her pumps one time and states that she really felt like it felt good. With that being said she seems to be in good spirits and her legs appear to be doing excellent. 03/09/2019 on evaluation today patient appears to be doing well with regard to her right lower extremity there are no open wounds at this time she is having some discomfort but I feel like this is more neuropathy than anything. With regard to her left lower extremity she had several areas scattered around that she does have some weeping and drainage from but again overall she does not appear to be having any  significant issues and no evidence of infection at this time which is good news. 03/23/2019 on evaluation today patient appears to be doing well with regard to her right lower extremity which she tells me is still close she is using her juxta light here. Her left lower extremity she mainly just has an area on the foot which is still slightly draining although this also is doing great. Overall very pleased at this time. 04/06/2019 patient appears to be doing a little bit worse in regard to her left lower extremity upon evaluation today. She feels like this could be becoming infected again which she had issues with previous. Fortunately there is no signs of systemic infection but again this is always a struggle with her with her legs she will go from doing well to not so well in a very short amount of time. 04/20/2019 on evaluation today patient actually appears to be doing quite well with regard to her right lower extremity I do not see any signs of active infection at this time. Fortunately there is no fever chills noted. She is still taking the antibiotics which I prescribed for her at this point. In regard to the left lower extremity I do feel like some of these areas are better although again she still is having weeping from several locations at this time. 04/27/2019 on evaluation today patient appears to be doing about the same if not slightly worse in regard to her left lower extremity ulcers. She tells me when questioned that she has been sleeping in her Hoveround chair in fact she tells me she falls asleep without even knowing it. I think she is spending a whole lot of time in the chair and less time walking and moving around which is not good for her legs either. On top of that she is in a seated position which is also the worst position she is not really elevating her legs and she is also not using her lymphedema pumps. All this is good to contribute to worsening of her condition in  general. 05/18/2019 on evaluation today patient appears to be doing well with regard to her lower extremity on the right in fact this is showing no signs of any open wounds at this time. On the left she is continuing to have issues with areas that do drain. Some of the regions have healed and there are  couple areas that have reopened. She did go to the ER per the patient according to recommendations from the home health nurse due to what she was seen when she came out on 05/13/2019. Subsequently she felt like the patient needed to go to the hospital due to the fact that again she was having "milky white discharge" from her leg. Nonetheless she had and then was placed on doxycycline and subsequently seems to be doing better. 06/01/2019 upon evaluation today patient appears to be doing really in my opinion about the same. I do not see any signs of active infection which is good news. Overall she still has wounds over the bilateral lower extremities she has reopened on the right but this appears to be more of a crack where there is weeping/edema coming from the region. I do not see any evidence of infection at either site based on what I visualized today. 07/13/2019 upon evaluation today patient appears to be doing a little worse compared to last time I saw her. She since has been in the hospital from 06/21/2019 through 06/29/2019. This was secondary to having Covid. During that time they did apply lotion to her legs which unfortunately has caused her to develop a myriad of open wounds on her lower extremities. Her legs do appear to be doing better as far as the overall appearance is concerned but nonetheless she does have more open and weeping areas. 07/27/2019 upon evaluation today patient appears to be doing more poorly to be honest in regard to her left lower extremity in particular. There is no signs of systemic infection although I do believe she may have local infection. She notes she has been having  a lot of blue/green drainage which is consistent potentially with Pseudomonas. That may be something that we need to consider here as well. The doxycycline does not seem to have been helping. 08/03/2019 upon evaluation today patient appears to be doing a little better in my opinion compared to last week's evaluation. Her culture I did review today and she is on appropriate medications to help treat the Enterobacter that was noted. Overall I feel like that is good news. With that being said she is unfortunately continuing to have a lot of drainage and though it is doing better I still think she has a long ways to go to get things dried up in general. Fortunately there is no signs of systemic infection. 08/10/2019 upon evaluation today patient appears to be doing may be slightly better in regard to her left lower extremity the right lower extremity is doing much better. Fortunately there is no signs of infection right now which is good news. No fevers, chills, nausea, vomiting, or diarrhea. 08/24/2019 on evaluation today patient appears to be doing slightly better in regard to her lower extremities. The left lower extremity seems to be healed the right lower extremity is doing better though not completely healed as far as the openings are concerned. She has some generalized issues here with edema and weeping secondary to her lymphedema though again I do believe this is little bit drier compared to prior weeks evaluations. In general I am very pleased with how things seem to be progressing. No fevers, chills, nausea, vomiting, or diarrhea. 08/31/2019 upon evaluation today patient actually seems to making some progress here with regard to the left lower extremity in particular. She has been tolerating the dressing changes without complication. Fortunately there is no signs of active infection at this time. No fevers, chills, nausea,  vomiting, or diarrhea. She did see Dr. Doren Custard and he did note that she did have  a issue with the left great saphenous vein and the small saphenous vein in the leg. With that being said he was concerned about the possibility of laser ablation not being extremely successful. He also mentioned a small risk of DVT associated with the procedure. However if the wounds do not continue to improve he stated that that would probably be the way to go. Fortunately the patient's legs do seem to be doing much better. 09/07/2019 upon evaluation today patient appears to be doing better with regard to her lower extremities. She has been tolerating the dressing changes without complication. With that being said she is showing signs of improvement and overall very pleased. There are some areas on her leg that I think we do need to debride we discussed this last week the patient is in agreement with doing that as long as it does not hurt too badly. 09/14/2019 upon evaluation today patient appears to be doing decently well with regard to her left lower extremity. She is not having near as much weeping as she has had in the past things seem to be drying up which is good news. There is no signs of active infection at this time. 09/21/19 upon evaluation today patient appears to be doing better in regard overall to her bilateral lower extremities. She again has less open than she did previous and each week I feel like this is getting better. Fortunately there is no signs of active infection at this time. No fevers, chills, nausea, vomiting, or diarrhea. 09/28/2019 upon evaluation today patient actually appears to be showing signs of improvement with regard to her left lower extremity. Unfortunately the right medial lower extremity around the ankle region has reopened to some degree but this appears to be minimal still which is good news. There is no signs of active infection at this time which is also good news. 10/12/2019 upon evaluation today patient appears to be doing okay with regard to her bilateral  lower extremities today. The right is a little bit worse then last evaluation 2 weeks ago. The left is actually doing a little better in my opinion. Overall there is no signs of active infection at this time that I see. Obviously that something we have to keep a close eye on she is very prone to this with the significant and multiple openings that she has over the bilateral lower extremities. 10/19/2019 upon evaluation today patient appears to be doing about the best that I have seen her in quite some time. She has been tolerating the dressing changes without complication. There does not appear to be any signs of active infection and overall I am extremely happy with the way her legs appeared. She is drying up quite nicely and overall is having less pain. 11/09/2019 upon evaluation today patient appears to be doing better in regard to her wounds. She seems to be drying up more and more each time I see her this is just taking a very long time. Fortunately there is no signs of active infection at this time. 11/23/2019 upon evaluation today patient actually appears to be doing excellent in regard to her lower extremities at this point compared to where she has been. Fortunately there is no signs of active infection at this time. She did go to the hospital last week for nausea and vomiting completely unrelated to her wounds. Fortunately she is doing better she was  given some Reglan and got better. She had associated abdominal pain but they never found out what was going on. 12/07/2019 upon evaluation today patient appears to be doing well for the most part in regard to her legs. She unfortunately has not been keeping the [place] portion of her wraps on therefore the compression has not really been sufficient for what it is supposed to be. Nonetheless she tells me that it just hurt too bad therefore she removed it. 12/21/2019 upon evaluation today patient actually appears to be doing quite well with regard to her  legs. I do feel like she has been making progress which is great news and overall there is no signs of active infection at this time. No fevers, chills, nausea, vomiting, or diarrhea. 01/04/2020 upon evaluation today patient presents for follow-up concerning her lower extremity edema bilaterally. She still has open wounds she has not been using her lymphedema pumps. She is also not been utilizing her compression wraps appropriately she tends to unwrap them, take them off, or states that they hurt. Obviously the reason they hurt is because her legs start to swell but the issue is if she would use her compression/lymphedema pumps regularly she would not swell and she would have the pain. Nonetheless she has not even picked them up once honestly over the past several months and may be even as much as in the past year based on my opinion and what have seen. She tells me today that after last week when I talked about this with her specifically actually that was 2 weeks ago that she "forgot". 8//21 on evaluation today patient appears to be doing a little better in regard to her legs bilaterally. Fortunately there is no signs of active infection at this time. She tells me that she used her lymphedema pumps all of one time over the past 2 weeks since I last saw her. She tells me that she has been too busy in order to continue to use these. 02/01/2020 on evaluation today patient appears to be doing some better in regard to her wounds in general in her legs. We felt the right was healed although is not completely it does appear to be doing better she tells me she has been using her lymphedema pumps that she has had this six times since I last saw her. Obviously the more she does that the better she would do my opinion 02/15/2020 upon evaluation today patient appears to be doing about the same in regard to her legs. She tells me that she is pumping I'm still not sure how much she does to be perfectly honest. However  even if she does a little bit here and there I guess that is better than nothing. Fortunately there is no sign of active infection at this time which is great news. No fevers, chills, nausea, vomiting, or diarrhea. 02/29/2020 on evaluation today patient actually appears to be doing quite well all things considered this week. She has been tolerating the dressing changes without complication. Fortunately there is no signs of active infection at this time. No fevers, chills, nausea, vomiting, or diarrhea. 03/14/2020 upon evaluation today patient appears to be doing really about the same in regard to her legs. There is no signs of improvement overall and she as noted from home health does not appear to be elevating her legs he can get into her lift chair. There is too much stuff piled up on it the patient tells me. She also tells me  she cannot really use her pumps effectively due to the fact that she cannot have any space to get them on. Finally she is also not really elevating her legs because she is not sleeping in her bed she is sleeping in her chair currently and again overall I think everything that she is done in combination has been exactly the wrong thing for what she needs for her legs. 04/04/2020 upon evaluation today patient appears to be doing well at this time with regard to her legs. She is actually been pumping, keeping her wraps on, and to be honest she seems to be doing dramatically better the right leg is excellent the left leg is also excellent and measuring much smaller than previous. 04/18/2020 upon evaluation today patient actually is continue to make good progress in regard to her lower extremities bilaterally. Everything is improving and less wet that has been in the past overall I am extremely pleased with where things stand and I think that she is making great progress. The patient tells me she still continue to use her compression pumps 05/02/2020 on evaluation today patient appears  to be doing well at this time in regard to her left leg which is showing signs of drying up. With that being said she does have a lot of lymphedema type crusty skin around the toes of her left foot and the right medial ankle which has opened at this point. Fortunately there is no signs of active infection systemically at this point or even locally for that matter. 05/23/2020 on evaluation today patient appears to be doing well with regard to her lower extremities. Fortunately there is no signs of active infection at this time. No fever chills noted. She has been very depressed however she tells me. 06/06/2020 patient came in today for evaluation in regard to her bilateral lower extremity ulcerations. With that being said she came in feeling okay and actually laughing and joking around with the staff checking her in. Subsequently however she had a coughing spell and following the coughing spell it was a dramatic conversion from being jovial and joking around to being extremely short of breath her vital signs actually dropped in regard to her blood pressure from around 175 to down around 628 for systolic and from around 96 diastolic down to around 70. With that being said she also accompanied this with an increase in her respiratory rate which was also quite significant. Nonetheless I actually upon going into see her was extremely worried we called EMS to have her transported to the ER for further evaluation and treatment. She continued until EMS got here to be extremely short of breath even on 6 L of oxygen. Her oxygen saturation did come up to 99% but overall it was only 95 before which was not terrible she did feel like the oxygen helped her feel somewhat better however. She has a history of asthma but again even the coughing spell really should not have done this degree of alteration in her demeanor from where she was just before to after the coughing spell. She tells [place] however she has been feeling  somewhat abnormal since Friday although she could not really pinpoint exactly what was going on. 06/27/2020 plan evaluation today patient appears to be doing okay in regard to her leg ulcers. She is really not showing a lot of improvement to be honest and she still has a lot of weeping. With that being said after I last saw her we called EMS to  take her to the hospital she actually did not go she went to see her primary care provider who according to the patient have not identified and read through the note states that everything checked out okay. Nonetheless the patient has continued to have bouts where she gets very short of breath for seemingly no reason whatsoever. That happened even the same night that I saw her last time. This was after she refused transport to the hospital. With that being said she also tells me that just getting dressed to come here is quite a chore which is putting on her shirt causing her to have to stop to catch her breath. Obviously this is not normal and I feel like there is something going on. She may need a referral to be seen by cardiology ASAP. She does see her primary care provider early next week she tells me 07/11/2020 upon evaluation today patient's wounds again appear to be doing about the same she mainly has weeping of the bilateral lower extremities both are very swollen today she tells me she has been using her pumps but last time I saw her she told me she did not even have her pumps out that she had "packed them away because they had too much stuff in their house. Apparently her husband has been bringing stuff in from storage units. She then subsequently told me that she had so much stuff in her house that she has been staying up all night and did not go to bed till 7:00 this morning because she was cleaning up stuff. Nonetheless I am still unsure as to whether or not she is using the pumps based on what I am seeing I would think probably not. Nonetheless she  has been keeping the compression wraps in place that is at least something 07/25/2020 upon evaluation today patient appears to be doing well currently in regard to her legs all things considered. I do not think she is doing any worse significantly although honestly I do not think she is doing a lot better either. There does not appear to be any signs of infection which is good news although she does have a lot of weeping. She is using her pumps she tells me sporadically though she says that her husband is not a big fan of helping her to get the [place]. She also tells me is difficult because in their house. It would be easiest for her to use these as where her son is actually staying with her and living out at this point. Obviously I understand that she is having a lot of issues here and to be honest I do not really know what to say in that regard and the fact that I really cannot change the situations but I do believe she really needs to be using the lymphedema pumps to try to keep things under control here. Outside of that I think she is going to continue to have significant issues. I think the pumps coupled with good compression and elevation are the only things that are to help her at this point. 08/08/2020 upon evaluation today patient appears to be doing a little worse in regard to her legs in general. I think this may be due to some infection currently. With that being said I am going to go ahead and likely see about putting her on an antibiotic. Were also can obtain a culture today where she had some purulent drainage. 08/29/2020 upon evaluation today patient appears to be doing  about the same in regard to her bilateral lower extremities. Unfortunately she is continuing to have significant issues here with edema and she has significant lymphedema. With that being said she is really not doing anything that she is supposed to be doing as far as elevation, compression, using lymphedema pumps or  anything really for that matter. She does not use her pumps, is taken the wraps off shortly after having them put on as far as home health is concerned at least by the next day she tells me, and overall is not really elevating her legs she is telling me she has a lot to do as far as cleaning up in her house. Nonetheless this triad of noncompliance is leading to worsening not improving in regard to her legs. I had a very strong conversation with her about this today again. 09/12/2020 on evaluation today patient appears to be doing poorly with regard to her lower extremities bilaterally. Fortunately there does not appear to be any signs of active infection at this point. That is systemically. Locally there is some signs of pus coming from an area on her left leg. She has been on antibiotics recently though she is done with those currently. Nonetheless she tells me she has been having increased pain with the right leg this is extremely swollen as well. Fortunately there does not appear to be significant infection of the right leg though I think her pain is really as result of the increased swelling she has been declining the wrap on the right leg and the wounds here are getting significantly worse week by week. This obviously is not will be want to see. I discussed with the patient however if she does not use the wraps, use her pumps, and elevate her legs that she is not to get better. She has been sleeping in her motorized Hoveround which does not even allow her to elevate her legs at all. She tells me that she is also constantly "cleaning up as her husband told her that he wants to move back to [place]." Unfortunately this has been the narrative for the past several months in fact that she tells me that she has been having to clean up mass that was moved in from storage units into their home and that she does not really sleep and she never really elevates her legs. I explained to the patient today  that if she is not can I do any of this that there is really nothing that I can do to help her. 09/26/20 upon evaluation today patient appears to be doing about the same in regard to her wounds. Fortunately there is no signs of active infection at this time. No fever chills noted. She has been tolerating the dressing changes without complication which is good news. With that being said I do think that she unfortunately is still having a lot of pain but I think this is due to the swelling that really is not controlled and as I discussed with her last time she needs to be using her lymphedema pumps there is still in the box she has not even attempted to use those whatsoever. I am also not even certain she is taking her fluid pills as she states she may need to have "get a refill" she was seen with her husband at the appointment today. 10/24/2020 upon evaluation today patient appears to be doing decently well in regard to her legs all things considered. She has been tolerating the dressing  changes without complication. Fortunately there is no signs of active infection at this time. No fevers, chills, nausea, vomiting, or diarrhea. With that being said the patient does appear to have some excessive thickened skin buildup currently that has been require some sharp debridement to clear this away. I am hopeful if we do this we will be able to get some of this area to dry out this otherwise trapping fluid underneath. Her son is present during the office visit today. She tells me she still been doing a lot as far as cleaning up and going through boxes she tells me she has been up for a long time already this morning. 5/25; patient presents for 2-week follow-up. She had 3 layer compression wrap placed with calcium alginate underneath. She reports tolerating the wraps well. She has home health that changes the wrap 2-3 times a week. She denies signs of infection. She has almost finished her course of antibiotics. She  uses her lymphedema pumps once weekly. 11/21/2020 upon evaluation today patient appears to be doing well with regard to her legs all things considered. Fortunately there is no signs of active infection at this time which is great news I do not see any evidence of infection and overall I think that she is definitely improved compared to where things did previous. Nonetheless I do think that she may benefit from removing some of the thicker skin on the legs. Again we have attempted this in the past to some degree but I think that we might be able to more effectively do this of a clear some way and then potentially use a urea cream with salicylic acid to try to help clear some of this away. The patient is in agreement with the plan. 12/05/2020 upon evaluation today patient appears to be doing well with regard to her wounds in general. I feel like that things are somewhat improved the skin looking a little better. With that being said she still has significant lymphedema. She is not been using her lymphedema pumps and not really been elevating her legs. She also did have some maggots noted on the left leg she tells me that the "gnats are terrible and get all over her foot." With that being said I am concerned about the fact that the patient still is quite swollen even with the compression wraps I really feel like she needs to be using her pumps but I cannot talk her into doing this. 12/19/2020 upon evaluation today patient appears to be doing about the same in regard to her legs. Fortunately I do not see any signs of infection unfortunately she really seems to be very nonchalant about the fact that she has not been using her lymphedema pumps. She tells me that "I just forget". With that being said this needs to be something that is a priority for her I cannot count the number of times that have had this discussion with her and yet nothing seems to change. With that being said I think that is very unlikely she  is ever really getting healed with regard to her wounds to be honest she does not seem to be motivated to do anything to try to help at this point despite everything that I have tried to do and recommend for her. 01/02/2021 upon evaluation today patient actually appears to be doing excellent in regard to her wounds. She has been tolerating the dressing changes without complication. Fortunately there does not appear to be any signs of active infection  at this time which is great news. No fevers, chills, nausea, vomiting, or diarrhea. 01/23/2021 upon evaluation today patient appears to be doing worse in general in regard to her wounds. This is due to the fact that she is not been using her compression pumps which again she knows when she does not it always turns out poorly for her. Unfortunately the issue here today is that she is continuing to have troubles with her son she seems to be very depressed and I am truly sorry for her and everything she is going through. With that being said I do not know what to do to make things better if she want elevate her legs and use her lymphedema pumps there is really not have anything I can do to correct or fix the situation for her to be honest. I discussed all this with her today. 02/20/2021 upon evaluation today the patient unfortunately has been in the hospital due to cellulitis. Again this is something that is been an ongoing issue based on what I am seeing I think that she unfortunately still is having trouble complying with anything that we recommend. She has not been sleeping in her bed she tells me that she continues sleeping really in her motorized wheelchair that sweats been the case for the past several visits that have seen her despite me counseling to the opposite that she should be trying to sleep in her bed where she could get her legs off the ground and allow some of the swelling edema to come down. Subsequently also it appears that someone not sure his  primary care otherwise has given orders for her to not be wrapped and subsequently to be washing her legs daily which she is not doing. This was prior to her going into the hospital. She also tells me she is not been using her lymphedema pumps even before the infection setting and she went to the hospital. She also tells me that she does not always take her fluid pills and again the compression wraps have not been in place. Pretty much she has not done anything that I recommended up to this point. Again I had this conversation with her multiple times and unfortunately just does not seem to be sinking in that she really needs to be taking care of herself better. She is extremely noncompliant. Electronic Signature(s) Signed: 02/20/2021 5:51:59 PM By: [name] PA-C Entered By: [name] on 02/20/2021 17:51:59 -------------------------------------------------------------------------------- Physical Exam Details Patient Name: Date of Service: NAHARA, DONA 02/20/2021 2:45 PM Medical Record Number: [id number] Patient Account Number: [id number] Date of Birth/Sex: Treating RN: 08/23/1950 (70 y.o. [name] Primary Care Provider: [name] Other Clinician: Referring Provider: Treating Provider/Extender: Sallye Ober, Whitney Weeks in Treatment: 185 Constitutional Obese and well-hydrated in no acute distress. Respiratory normal breathing without difficulty. Psychiatric this patient is able to make decisions and demonstrates good insight into disease process. Alert and Oriented x 3. pleasant and cooperative. Notes Upon inspection patient's wounds again on her legs she is much more widespread they have been using Xeroform which obviously is made things extremely wet. With that being said I think that the alginate is a much better way to go but nonetheless I think she also needs to be compression wrap which has not been done. This needs to be done 3 times a week and  again that is also not happening. With that being said I am very concerned about the fact of the erythema  that is noted on the right ankle anteriorly which again she appears to still be showing signs of infection this is not good. She is still taking the Augmentin that she was given at the hospital and she showed me the bottle today. Electronic Signature(s) Signed: 02/20/2021 5:52:46 PM By: [name] PA-C Entered By: [name] on 02/20/2021 17:52:46 -------------------------------------------------------------------------------- Physician Orders Details Patient Name: Date of Service: [name] 02/20/2021 2:45 PM Medical Record Number: [id number] Patient Account Number: [id number] Date of Birth/Sex: Treating RN: 1950/11/14 (70 y.o. [name] Primary Care Provider: [name] Other Clinician: Referring Provider: Treating Provider/Extender: Melchor Amour Weeks in Treatment: [phone] Verbal / Phone Orders: No Diagnosis Coding ICD-10 Coding Code Description E11.622 Type 2 diabetes mellitus with other skin ulcer I89.0 Lymphedema, not elsewhere classified I87.331 Chronic venous hypertension (idiopathic) with ulcer and inflammation of right lower extremity I87.332 Chronic venous hypertension (idiopathic) with ulcer and inflammation of left lower extremity L97.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 leg with fat layer exposed L97.822 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 Bathing/ Shower/ Hygiene May shower and wash wound with soap and water. - with dressing changes, wash both legs with wash cloth and soap  and water with dressing changes Other Bathing/Shower/Hygiene Orders/Instructions: - Home health to wash legs with warn soapy water and a clean wash cloth with dressing changes Edema Control - Lymphedema / SCD / Other Bilateral Lower Extremities Lymphedema Pumps. Use Lymphedema pumps on leg(s) 2-3 times a day for 45-60 minutes. If wearing any wraps or hose, do not remove them. Continue exercising as instructed. Elevate legs to the level of the heart or above for 30 minutes daily and/or when sitting, a frequency of: - especially at night Avoid standing for long periods of time. Exercise regularly [place] wound care orders this week; continue Home Health for wound care. May utilize formulary equivalent dressing for wound treatment orders unless otherwise specified. - change dressing to silver alginate, wash legs with warm soapy water and wash cloth with dressing changes Dressing changes to be completed by [place] on Monday / Wednesday / Friday except when patient has scheduled visit at [facility]. Other Home Health Orders/Instructions: - [facility] Wound Treatment Wound #61 - Lower Leg Wound Laterality: Left, Circumferential Peri-Wound Care: Sween Lotion (Moisturizing lotion) (Home Health) 3 x Per Week/30 Days Discharge Instructions: Apply moisturizing lotion to dry skin on legs Prim Dressing: KerraCel Ag Gelling Fiber Dressing, 4x5 in (silver alginate) 3 x Per Week/30 Days ary Discharge Instructions: Apply silver alginate to wound bed as instructed Secondary Dressing: ABD Pad, 8x10 (Home Health) 3 x Per Week/30 Days Discharge Instructions: Apply over primary dressing as directed. Secondary Dressing: Zetuvit Plus 4x4 in (Home Health) 3 x Per Week/30 Days Discharge Instructions: or equivalent extra absorbent pad.Apply over primary dressing as needed. Compression Wrap: ThreePress (3 layer compression wrap) (Home Health) 3 x Per Week/30 Days Discharge Instructions: Apply three  layer compression as directed. Pad bend of ankle with foam or ABD pad. Wound #64 - Foot Wound Laterality: Dorsal, Left Peri-Wound Care: Sween Lotion (Moisturizing lotion) (Home Health) 3 x Per Week/30 Days Discharge Instructions: Apply moisturizing lotion to dry  skin on legs Prim Dressing: KerraCel Ag Gelling Fiber Dressing, 4x5 in (silver alginate) 3 x Per Week/30 Days ary Discharge Instructions: Apply silver alginate to wound bed as instructed Secondary Dressing: ABD Pad, 8x10 (Home Health) 3 x Per Week/30 Days Discharge Instructions: Apply over primary dressing as directed. Secondary Dressing: Zetuvit Plus 4x4 in (Home Health) 3 x Per Week/30 Days Discharge Instructions: or equivalent extra absorbent pad.Apply over primary dressing as needed. Compression Wrap: ThreePress (3 layer compression wrap) (Home Health) 3 x Per Week/30 Days Discharge Instructions: Apply three layer compression as directed. Pad bend of ankle with foam or ABD pad. Wound #67 - Lower Leg Wound Laterality: Right, Medial Peri-Wound Care: Sween Lotion (Moisturizing lotion) (Home Health) 3 x Per Week/30 Days Discharge Instructions: Apply moisturizing lotion to dry skin on legs Prim Dressing: KerraCel Ag Gelling Fiber Dressing, 4x5 in (silver alginate) 3 x Per Week/30 Days ary Discharge Instructions: Apply silver alginate to wound bed as instructed Secondary Dressing: ABD Pad, 8x10 (Home Health) 3 x Per Week/30 Days Discharge Instructions: Apply over primary dressing as directed. Secondary Dressing: Zetuvit Plus 4x4 in (Home Health) 3 x Per Week/30 Days Discharge Instructions: or equivalent extra absorbent pad.Apply over primary dressing as needed. Compression Wrap: ThreePress (3 layer compression wrap) (Home Health) 3 x Per Week/30 Days Discharge Instructions: Apply three layer compression as directed. Pad bend of ankle with foam or ABD pad. Wound #71 - Lower Leg Wound Laterality: Right, Posterior Peri-Wound Care: Sween  Lotion (Moisturizing lotion) (Home Health) 3 x Per Week/30 Days Discharge Instructions: Apply moisturizing lotion to dry skin on legs Prim Dressing: KerraCel Ag Gelling Fiber Dressing, 4x5 in (silver alginate) 3 x Per Week/30 Days ary Discharge Instructions: Apply silver alginate to wound bed as instructed Secondary Dressing: ABD Pad, 8x10 (Home Health) 3 x Per Week/30 Days Discharge Instructions: Apply over primary dressing as directed. Secondary Dressing: Zetuvit Plus 4x4 in (Home Health) 3 x Per Week/30 Days Discharge Instructions: or equivalent extra absorbent pad.Apply over primary dressing as needed. Compression Wrap: ThreePress (3 layer compression wrap) (Home Health) 3 x Per Week/30 Days Discharge Instructions: Apply three layer compression as directed. Pad bend of ankle with foam or ABD pad. Wound #72 - Foot Wound Laterality: Dorsal, Right Peri-Wound Care: Sween Lotion (Moisturizing lotion) (Home Health) 3 x Per Week/30 Days Discharge Instructions: Apply moisturizing lotion to dry skin on legs Prim Dressing: KerraCel Ag Gelling Fiber Dressing, 4x5 in (silver alginate) 3 x Per Week/30 Days ary Discharge Instructions: Apply silver alginate to wound bed as instructed Secondary Dressing: ABD Pad, 8x10 (Home Health) 3 x Per Week/30 Days Discharge Instructions: Apply over primary dressing as directed. Secondary Dressing: Zetuvit Plus 4x4 in (Home Health) 3 x Per Week/30 Days Discharge Instructions: or equivalent extra absorbent pad.Apply over primary dressing as needed. Compression Wrap: ThreePress (3 layer compression wrap) (Home Health) 3 x Per Week/30 Days Discharge Instructions: Apply three layer compression as directed. Pad bend of ankle with foam or ABD pad. Wound #73 - Lower Leg Wound Laterality: Right, Lateral Peri-Wound Care: Sween Lotion (Moisturizing lotion) (Home Health) 3 x Per Week/30 Days Discharge Instructions: Apply moisturizing lotion to dry skin on legs Prim Dressing:  KerraCel Ag Gelling Fiber Dressing, 4x5 in (silver alginate) ary 3 x Per Week/30 Days Discharge Instructions: Apply silver alginate to wound bed as instructed Secondary Dressing: ABD Pad, 8x10 (Home Health) 3 x Per Week/30 Days Discharge Instructions: Apply over primary dressing as directed. Secondary Dressing: Zetuvit Plus 4x4 in [facility]) 3  x Per Week/30 Days Discharge Instructions: or equivalent extra absorbent pad.Apply over primary dressing as needed. Compression Wrap: ThreePress (3 layer compression wrap) (Home Health) 3 x Per Week/30 Days Discharge Instructions: Apply three layer compression as directed. Pad bend of ankle with foam or ABD pad. Consults Infectious Disease - recurring cellulitis and infection to bilateral lower extremities with lymphedema - (ICD10 L97.812 - Non-pressure chronic ulcer of other part of right lower leg with fat layer exposed) Electronic Signature(s) Signed: 02/20/2021 6:00:14 PM By: [name] PA-C Signed: 02/20/2021 6:09:03 PM By: [name] RN, BSN Entered By: [name] on 02/20/2021 16:03:00 Prescription 02/20/2021 -------------------------------------------------------------------------------- [name]. [name] [place] Patient Name: Provider: [birth year] [id number] Date of Birth: NPI#: [id number] Sex: DEA #: [phone] Phone #: License #: [place] Patient Address: [address] [phone] [place], [place] [address] [place], [place] [address] [phone] Allergies ACE Inhibitors; metformin; propofol Provider's Orders Infectious Disease - ICD10: L97.812 - recurring cellulitis and infection to bilateral lower extremities with lymphedema Hand Signature: Date(s): Electronic Signature(s) Signed: 02/20/2021 6:00:14 PM By: [name] PA-C Signed: 02/20/2021 6:09:03 PM By: [name] RN, BSN Entered By: [name] on  02/20/2021 16:03:04 -------------------------------------------------------------------------------- Problem List Details Patient Name: Date of Service: [name]. 02/20/2021 2:45 PM Medical Record Number: [id number] Patient Account Number: [id number] Date of Birth/Sex: Treating RN: September 21, 1950 (70 y.o. [name] Primary Care Provider: [name] Other Clinician: Referring Provider: Treating Provider/Extender: Melchor Amour Weeks in Treatment: [phone] Active Problems ICD-10 Encounter Code Description Active Date MDM Diagnosis E11.622 Type 2 diabetes mellitus with other skin ulcer 08/05/2017 No Yes I89.0 Lymphedema, not elsewhere classified 08/05/2017 No Yes I87.331 Chronic venous hypertension (idiopathic) with ulcer and inflammation of right 08/05/2017 No Yes lower extremity I87.332 Chronic venous hypertension (idiopathic) with ulcer and inflammation of left 08/05/2017 No Yes lower extremity L97.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 leg with fat layer 08/05/2017 No Yes exposed L97.822 Non-pressure chronic ulcer of other part of left lower leg with fat layer exposed2/20/2019 No Yes L97.522 Non-pressure chronic ulcer of other part of left foot with fat layer exposed 06/01/2019 No Yes I10 Essential (primary) hypertension 08/05/2017 No Yes E66.01 Morbid (severe) obesity due to excess calories 08/05/2017 No Yes F41.8 Other specified anxiety disorders 08/05/2017 No Yes R53.1 Weakness 08/05/2017 No Yes Inactive Problems Resolved Problems Electronic Signature(s) Signed: 02/20/2021 2:59:38 PM By: [name] PA-C Entered By: [name] on 02/20/2021 14:59:37 -------------------------------------------------------------------------------- Progress Note Details Patient Name: Date of Service: [name]. 02/20/2021 2:45 PM Medical Record Number: [id number] Patient Account Number: [id number] Date of Birth/Sex: Treating  RN: 1951/01/29 (70 y.o. [name] Primary Care Provider: [name] Other Clinician: Referring Provider: Treating Provider/Extender: Melchor Amour Weeks in Treatment: [phone] Subjective Chief Complaint Information obtained from Patient Bilateral reoccurring LE ulcers History of Present Illness (HPI) this patient has been seen a couple of times before and returns with recurrent problems to her right and left lower extremity with swelling and weeping ulcerations due to not wearing her compression stockings which she had been advised to do during her last discharge, at the end of June 2018. During her last visit the patient had had normal arterial blood flow and her venous reflux study did not necessitate any surgical intervention. She was recommended compression and elevation and wound care. After  prolonged treatment the patient was completely healed but she has been noncompliant with wearing or compressions.. She was here last week with an outpatient return visit planned but the patient came in a very poor general condition with altered mental status and was rushed to the ER on my request. With a history of hypertension, diabetes, TIA and right-sided weakness she was set up for an MRI on her brain and cervical spine and was sent to [facility]. Getting an MRI done was very difficult but once the workup was done she was found not to have any spinal stenosis, epidural abscess or hematoma or discitis. This was radiculopathy to be treated as an outpatient and she was given a follow-up appointment. Today she is feeling much better alert and oriented and has come to reevaluate her bilateral lower extremity lymphedema and ulceration 03/25/2017 -- she was admitted to the hospital on 03/16/2017 and discharged on 03/18/2017 with left leg cellulitis and ulceration. She was started on vancomycin and Zosyn and x-ray showed no bony involvement. She was treated for a cellulitis with  IV antibiotics changed to Rocephin and Flagyl and was discharged on oral Keflex and doxycycline to complete a 7 day course. Last hemoglobin A1c was 7.1 and her other ailments including hypertension got asthma were appropriately treated. 05/06/2017 -- she is awaiting the right size of compression stockings from [facility] but other than that has been doing well. ====== Old notes 70 year old patient was seen one time last October and was lost to follow-up. She has recurrent problems with weeping and ulceration of her left lower extremity and has swelling of this for several years. It has been worse for the last 2 months. Past medical history is significant for diabetes mellitus type 2, hypertension, gout, morbid obesity, depressive disorders, hiatal hernia, migraines, status post knee surgery, risk of a cholecystectomy, vaginal hysterectomy and breast biopsy. She is not a smoker. As noted before she has never had a venous duplex study and an arterial ABI study was attempted but the left lower extremity was noncompressible 10/01/2016 -- had a lower extremity venous duplex reflux evaluation which showed no evidence of deep vein reflux in the right or left lower extremity, and no evidence of great saphenous vein reflux more than 500 ms in the right or left lower extremity, and the left small saphenous vein is incompetent but no vascular consult was recommended. review of her electronic medical records noted that the ABI was checked in July 2017 where the right ABI was normal limits and the left ABI could not be ascertained due to pain with cuff pressure but the waveforms are within normal limits. her arterial duplex study scheduled for April 27. 10/08/2016 -- the patient has various reasons for not having a compression on and for the last 3 days she has had no compression on her left lower extremity either due to pain or the lack of nursing help. She does not use her juxta lites either. 10/15/2016 -- the  patient did not keep her appointment for arterial duplex study on April 27 and I have asked her to reschedule this. Her pain is out of proportion with the physical findings and she continuously fails to wear a compression wraps and cuts them off because she says she cannot tolerate the pain. She does not use her juxta lites either. 10/22/2016 -- he has rescheduled her arterial duplex study to May 21 and her pain today is a bit better. She has not been wearing her juxta lites on  her right lower extremity but now understands that she needs to do this. She did tolerate the to press compression wrap on her left lower extremity 10/29/2016 --arterial duplex study is scheduled for next week and overall she has been tolerating her compression wraps and also using her juxta lites on her right lower extremity 11/05/2016 -- the right ABI was 0.95 the left was 1.03. The digit TBI is on the right was 0.83 on the left was 0.92 and she had biphasic flow through these vessels. The impression was that of normal lower extremity arterial study. 11/12/2016 -- her pain is minimal and she is doing very well overall. 11/26/2016 -- she has got juxta lites and her insurance will not pay for additional dual layer compression stockings. She is going to order some from [facility]. 05/12/2017 -- her juxta lites are very old and too big for her and these have not been helping with compression. She did get 20-30 mm compression stockings from [facility] but she and her husband are unable to put these on. I believe she will benefit from bilateral Extremit-ease, compression stockings and we will measure her for these today. 05/20/2017 -- lymphedema on the left lower extremity has increased a lot and she has a open ulceration as a result of this. The right lower extremity is looking pretty good. She has decided to by the compression stockings herself and will get reimbursed by the home health, at a later date. 05/27/2017 -- her sciatica  is bothering her a lot and she thought her left leg pain was caused due to the compression wrap and hence removed it and has significant lymphedema. There is no inflammation on this left lower extremity. 06/17/17 on evaluation today patient appears to be doing very well and in fact is completely healed in regard to her ulcerations. Unfortunately however she does have continued issues with lymphedema nonetheless. We did order compression garments for her unfortunately she states that the size that she received were large although we ordered medium. Obviously this means she is not getting the optimal compression. She does not have those with her today and therefore we could not confirm and contact the company on her behalf. Nonetheless she does state that she is going to have her husband bring them by tomorrow so that we can verify and then get in touch with the company. No fevers, chills, nausea, or vomiting noted at this time. Overall patient is doing better otherwise and I'm pleased with the progress she has made. 07/01/17 on evaluation today patient appears to be doing very well in regard to her bilateral lower extremity she does not have any openings at this point which is excellent news. Overall I'm pleased with how things have progressed up to this time. Since she is doing so well we did order her compression which we are seeing her today to ensure that it fits her properly and everything is doing well in that regard and then subsequently she will be discharged. ============ Old Notes: 03/31/16 patient presents today for evaluation concerning open wounds that she has over the left medial ankle region as well as the left dorsal foot. She has previously had this occur although it has been healed for a number of months after having this for about a year prior until her hospitalization on 01/05/16. At that point in time it appears that she was admitted to the hospital for left lower extremity cellulitis  and was placed on vancomycin and Zosyn at that point. Eventually upon  discharge on January 15, 2016 she was placed on doxycycline at that point in time. Later on 03/27/16 positive wound culture growing Escherichia coli this was switched to amoxicillin. Currently she tells me that she is having pain radiated to be a 7 out of 10 which can be as high as 10 out of 10 with palpation and manipulation of the wound. This wound appears to be mainly venous in nature due to the bilateral lower extremity venous stasis/lymphedema. This is definitely much worse on her left than the right side. She does have type 1 diabetes mellitus, hypertension, morbid obesity, and is wheelchair dependent.during the course of the hospital stay a blood culture was also obtained and fortunately appeared negative. She also had an x-ray of the tibia/fibula on the left which showed no acute bone abnormality. Her white blood cell count which was performed last on 03/25/16 was 7.3, hemoglobin 12.8, protein 7.1, albumin 3.0. Her urine culture appeared to be negative for any specific organisms. Patient did have a left lower extremity venous duplex evaluation for DVT . This did not include venous reflux studies but fortunately was negative for DVT Patient also had arterial studies performed which revealed that she had a . normal ABI on the right though this was unable to be performed on the left secondary to pain that she was having around the ankle region due to the wound. However it was stated on report that she had biphasic pulses and apparently good blood flow. ========== 06/03/17 she is here in follow-up evaluation for right lower extremity ulcer. The right lower sure he has healed but she has reopened to the left medial malleolus and dorsal foot with weeping. She is waiting for new compression garments to arrive from home health, the previous compression garments were ill fitting. We will continue with compression bilaterally and  follow-up in 2 weeks Readmission: 08/05/17 on evaluation today patient appears to be doing somewhat poorly in regard to her left lower extremity especially although the right lower extremity has a small area which may no longer be open. She has been having a lot of drainage from the left lower extremity however he tells me that she has not been able to use the EXTREMIT-EASE Compression at this point. She states that she did better and was able to actually apply the Juxta-Lite compression although the wound that she has is too large and therefore really does not compress which is why she cannot wear it at this point. She has no one who can help her put it on regular basis her son can sometimes but he's not able to do it most of the time. I do believe that's why she has begun to weave and have issues as she is currently yet again. No fevers, chills, nausea, or vomiting noted at this time. Patient is no evidence of dementia. 08/12/17 on evaluation today patient appears to still be doing fairly well in regard to the draining areas/weeping areas at this point. With that being said she unfortunately did go to the ER yesterday due to what was felt to be possibly a cellulitis. They place her on doxycycline by mouth and discharge her home. She definitely was not admitted. With that being said she states she has had more discomfort which has been unusual for her even compared to prior times and she's had infections.08/12/17 on evaluation today patient appears to still be doing fairly well in regard to the draining areas/weeping areas at this point. With that being said she  unfortunately did go to the ER yesterday due to what was felt to be possibly a cellulitis. They place her on doxycycline by mouth and discharge her home. She definitely was not admitted. With that being said she states she has had more discomfort which has been unusual for her even compared to prior times and she's had infections. 08/19/17 put  evaluation today patient tells me that she's been having a lot of what sounds to be neuropathic type pain in regard to her left lower extremity. She has been using over-the-counter topical bins again which some believe. That in order to apply the she actually remove the wrap we put on her last Wednesday on Thursday. Subsequently she has not had anything on compression wise since that time. The good news is a lot of the weeping areas appear to have closed at this point again I believe she would do better with compression but we are struggling to get her to actually use what she needs to at this point. No fevers, chills, nausea, or vomiting noted at this time. 09/03/17 on evaluation today patient appears to be doing okay in regard to her lower extremities in regard to the lymphedema and weeping. Fortunately she does not seem to show any signs of infection at this point she does have a little bit of weeping occurring in the right medial malleolus area. With that being said this does not appear to be too significant which is good news. 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 She has severe skin damage secondary to both of these features involving the dorsal left foot and medial left ankle and lower leg. Still has open areas in the left anterior foot. The area on the right closed over. She uses her own juxta light stockings. She does not have an arterial issue 09/16/17 on evaluation today patient actually appears to be doing excellent in regard to her bilateral lower extremity swelling. The Juxta-Lite compression wrap seem to be doing very well for her. She has not however been using the portion that goes over her foot. Her left foot still is draining a little bit not nearly as significant as it has been in the past but still I do believe that she likely needs to utilize the full wrap including the foot portion of this will improve as  well. She also has been apparently putting on a significant amount of Vaseline which also think is not helpful for her. I recommended that if she feels she needs something for moisturizer Eucerin will probably be better. 09/30/17 on evaluation today patient presents with several new open areas in regard to her left lower extremity although these appear to be minimal and mainly seem to be more moisture breakdown than anything. Fortunately she does not seem to have any evidence of infection which is great news. She has been tolerating the dressing changes without complication we are using silver alginate on the foot she has been using AB pads to have the legs and using her Juxta- Lite compression which seems to be controlling her swelling very well. Overall I'm pleased with the poor way she has progressed. 10/14/17 on evaluation today patient appears to be doing better in regard to her left lower extremity areas of weeping. She does still have some discomfort although in general this does not appear to be as macerated and I think it is progressing nicely. I do think she still needs to wear the foot portion  of her Juxta- Lite in order to get the most benefit from the wrap obviously. She states she understands. Fortunately there does not appear to be evidence of infection at this time which is great news. 10/28/17 on evaluation today patient appears to be doing excellent in regard to her left lower extremity. She has just a couple areas that are still open and seem to be causing any trouble whatsoever. For that reason I think that she is definitely headed in the right direction the spots are very tiny compared to what we have been dealing with in the past. 11/11/17 on evaluation today patient appears to have a right lateral lower extremity ulcer that has opened since I last saw her. She states this is where the home health nurse that was coming out remove the dressing without wetting the alginate first.  Nonetheless I do not know if this is indeed the case or not but more importantly we have not ordered home help to be coming out for her wounds at all. I'm unsure as to why they are coming out and we're gonna have to check on this and get things situated in that regard. With that being said we currently really do not need them to be coming out as the patient has been taking care of her leg herself without complication and no issues. In fact she was doing much better prior to nursing coming out. 11/25/17 on evaluation today patient actually appears to be doing fairly well in regard to her left lower extremity swelling. In fact she has very little area of weeping at this point there's just a small spot on the lateral portion of her right leg that still has me just a little bit more concerned as far as wanting to see this clear up before I discharge her to caring for this at home. Nonetheless overall she has made excellent progress. 12/09/17 on evaluation today patient appears to be doing rather well in regard to her lower extremity edema. She does have some weeping still in the left lower extremity although the big area we were taking care of two weeks ago actually has closed and she has another area of weeping on the left lower extremity immediately as well is the top of her foot. She does not currently have lymphedema pumps she has been wearing her compression daily on a regular basis as directed. With that being said I think she may benefit from lymphedema pumps. 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 We had a very difficult time getting and keeping this under control. 12/23/17 on evaluation today patient actually appears to be doing a little bit more poorly in regard to her bilateral lower extremities. She has been tolerating the Juxta-Lite  compression wraps. Unfortunately she has two new ulcers on the right lower extremity and left lower Trinity ulceration seems to be larger. Obviously this is not good news. She has been tolerating the dressings without complication. 12/30/17 on evaluation today patient actually appears to be doing much better in regard to her bilateral lower extremity edema. She continues to have some issues with ulcerations and in fact there appears to be one spot on each leg where the wrap may have caused a little bit of a blister which is subsequently opened up at this point is given her pain. Fortunately it does not  appear to be any evidence of infection which is good news. No fevers chills noted. 01/13/18 on evaluation today patient appears to be doing rather well in regard to her bilateral lower extremities. The dressings did get kind of stuck as far as the wound beds are concerned but again I think this is mainly due to the fact that she actually seems to be showing signs of healing which is good news. She's not having as much drainage therefore she was having more of the dressing sticking. Nonetheless overall I feel like her swelling is dramatically down compared to previous. 01/20/18 on evaluation today patient unfortunately though she's doing better in most regards has a large blister on the left anterior lower extremity where she is draining quite significantly. Subsequently this is going to need debridement today in order to see what's underneath and ensure she does not continue to trapping fluid at this location. Nonetheless No fevers, chills, nausea, or vomiting noted at this time. 01/27/18 on evaluation today patient appears to be doing rather well at this point in regard to her right lower extremity there's just a very small area that she still has open at this point. With that being said I do believe that she is tolerating the compression wraps very well in making good progress. Home health is coming out at  this point to see her. Her left lower extremity on the lateral portion is actually what still mainly open and causing her some discomfort for the most part 02/10/18 on evaluation today patient actually appears to be doing very well in regard to her right lower extremity were all the ulcers appear to be completely close. In regard to the left lower extremity she does have two areas still open and some leaking from the dorsal surface of her foot but this still seems to be doing much better to me in general. 02/24/18 on evaluation today patient actually appears to be doing much better in regard to her right lower extremity this is still completely healed. Her left lower extremity is also doing much better fortunately she has no evidence of infection. The one area that is gonna require some debridement is still on the left anterior shin. Fortunately this is not hurting her as badly today. 03/10/18 on evaluation today patient appears to be doing better in some regards although she has a little bit more open area on the dorsal foot and she also has some issues on the medial portion of the left lower extremity which is actually new and somewhat deep. With that being said there fortunately does not appear to be any significant signs of infection which is good news. No fevers, chills, nausea, or vomiting noted at this time. In general her swelling seems to be doing fairly well which is good news. 03/31/18 on evaluation today patient presents for follow-up concerning her left lower extremity lymphedema. Unfortunately she has been doing a little bit more poorly since I last saw her in regard to the amount of weeping that she is experiencing. She's also having some increased pain in the anterior shin location. Unfortunately I do not feel like the patient is making such good progress at this point a few weeks back she was definitely doing much better. 04/07/18 on evaluation today patient actually appears to be showing  some signs of improvement as far as the left lower extremity is concerned. She has been tolerating the dressing changes and it does appear that the Drawtex did better for her. With that  being said unfortunately home health is stating that they cannot obtain the Drawtex going forward. Nonetheless we're gonna have to check and see what they may be able to get the alginate they were using was getting stuck in causing new areas of skin being pulled all that with and subsequently weep and calls her to worsen overall this is the first time we've seen improvement at this time. 04/14/18 on evaluation today patient actually appears to be doing rather well at this point there does not appear to be any evidence of infection at this time and she is actually doing excellent in regard to the weeping in fact she almost has no openings remaining even compared to just last week this is a dramatic improvement. No fevers chills noted 04/21/18 evaluation today patient actually appears to be doing very well. She in fact is has a small area on the posterior lower extremity location and she has a small area on the dorsal surface of her foot that are still open both of which are very close to closing. We're hoping this will be close shortly. She brought her Juxta-Lite wrap with her today hoping that would be able to put her in it unfortunately I don't think were quite at that point yet but we're getting closer. 04/28/18 upon evaluation today patient actually appears to be doing excellent in regard to her left lower extremity ulcer. In fact the region on the posterior lower extremity actually is much smaller than previously noted. Overall I'm very happy with the progress she has made. She again did bring her Juxta-Lite although we're not quite ready for that yet. 05/11/18 upon evaluation today patient actually appears to be doing in general fairly well in regard to her left lower Trinity. The swelling is very well  controlled. With that being said she has a new area on the left anterior lower extremity as well as between the first and second toes of her left foot that was not present during the last evaluation. The region of her posterior left lower extremity actually appears to be almost completely healed. T be honest I'm very pleased with o the way that stands. Nonetheless I do believe that the lotion may be keeping the area to moist as far as her legs are concerned subsequently I'm gonna consider discontinuing that today. 05/26/18 on evaluation today patient appears to be doing rather well in regard to her left lower should be ulcers. In fact everything appears to be close except for a very small area on the left posterior lower extremity. Fortunately there does not appear to be any evidence of infection at this time. Overall very pleased with her progress. 06/02/18 and evaluation today patient actually appears to be doing very well in regard to her lower extremity ulcers. She has one small area that still continues to weep that I think may benefit her being able to justify lotion and user Juxta-Lite wraps versus continued to wrap her. Nonetheless I think this is something we can definitely look into at this point. 06/23/18 on evaluation today patient unfortunately has openings of her bilateral lower extremities. In general she seems to be doing much worse than when I last saw her just as far as her overall health standpoint is concerned. She states that her discomfort is mainly due to neuropathy she's not having any other issues otherwise. No fevers, chills, nausea, or vomiting noted at this time. 06/30/18 on evaluation today patient actually appears to be doing a little worse in regard  to her right lower extremity her left lower extremity of doing fairly well. Fortunately there is no sign of infection at this time. She has been tolerating the dressing changes without complication. Home health did not come out  like they were supposed to for the appropriate wrap changes. They stated that they never received the orders from [place] which were fax. Nonetheless we will send a copy of the orders with the patient today as well. 07/07/18 on evaluation today patient appears to be doing much better in regard to lower extremities. She still has several openings bilaterally although since I last saw her her legs did show obvious signs of infection when she later saw her nurse. Subsequently a culture was obtained and she is been placed on Bactrim and Keflex. Fortunately things seem to be looking much better it does appear she likely had an infection. Again last week we'd even discussed it but again there really was not any obvious sign that she had infection therefore we held off on the antibiotics. Nonetheless I'm glad she's doing better today. 07/14/18 on evaluation today patient appears to be doing much better regarding her bilateral lower Trinity's. In fact on the right lower for me there's nothing open at this point there are some dry skin areas at the sites where she had infection. Fortunately there is no evidence of systemic infection which is excellent news. No fevers chills noted 07/21/18 on evaluation today patient actually appears to be doing much better in regard to her left lower extremity ulcers. She is making good progress and overall I feel like she's improving each time I see her. She's having no pain I do feel like the infection is completely resolved which is excellent news. No fevers, chills, nausea, or vomiting noted at this time. 07/28/18 on evaluation today patient appears to be doing very well in regard to her left lower [employer]. Everything seems to be showing signs of improvement which is excellent news. Overall very pleased with the progress that has been made. Fortunately there's no evidence of active infection at this time also excellent news. 08/04/18 on evaluation today patient appears to be  doing more poorly in regard to her bilateral lower extremities. She has two new areas open up on the right and these were completely closed as of last week. She still has the two spots on the left which in my pinion seem to be doing better. Fortunately there's no evidence of infection again at this point. 08/11/18 on evaluation today patient actually appears to be doing very well in regard to her bilateral lower Trinity wounds that all seem to be doing better and are measures smaller today. Fortunately there's no signs of infection. No fevers, chills, nausea, or vomiting noted at this time. 08/18/18 on evaluation today patient actually appears to be doing about the same inverter bilateral lower extremities. She continues to have areas that blistering open as was drain that fortunately nothing too significant. Overall I feel like Drawtex may have done better for her however compared to the collagen. 08/25/18 on evaluation today patient appears to be doing a little bit more poorly today even compared to last time I saw her. Again I'm not exactly sure why she's making worse progress over the past several weeks. I'm beginning to wonder if there is some kind of underlying low level infection causing this issue. I did actually take a culture from the left anterior lower extremity but it was a new wound draining quite a bit  at this point. Unfortunately she also seems to be having more pain which is what also makes me worried about the possibility of infection. This is despite never erythema noted at this point. 09/01/18 on evaluation today patient actually appears to be doing a little worse even compared to last week in regard to bilateral lower extremities. She did go to the hospital on the 16th was given a dose of IV Zosyn and then discharged with a recommendation to continue with the Bactrim that I previously prescribed for her. Nonetheless she is still having a lot of discomfort she tells me as well at this  time. This is definitely unfortunate. No fevers, chills, nausea, or vomiting noted at this time. 09/08/18 on evaluation today patient's bilateral lower extremities actually appear to be shown signs of improvement which is good news. Fortunately there does not appear to be any signs of active infection I think the anabiotic is helping in this regard. Overall I'm very pleased with how she is progressing. 09/15/18 patient was actually seen in ER yesterday due to her legs as well unfortunately. She states she's been having a lot of pain and discomfort as well as a lot of drainage. Upon inspection today the patient does have a lot of swelling and drainage I feel like this is more related to lymphedema and poor fluid control than it is to infection based on what I'm seeing. The physician in the emergency department also doubted that the patient was having a significant infection nonetheless I see no evidence of infection obvious at this point although I do see evidence of poor fluid control. She still not using a compression pumps, she is not elevating due to her lift chair as well as her hospital bed being broken, and she really is not keeping her legs up as much as they should be and also has been taking off her wraps. All this combined I think has led to poor fluid control and to be honest she may be somewhat volume overloaded in general as well. I recommend that she may need to contact your physician to see if a prescription for a diuretic would be beneficial in their opinion. As long as this is safe I think it would likely help her. 09/29/18 on evaluation today patient's left lower extremity actually appears to be doing quite a bit better. At least compared to last time that I saw her. She still has a large area where she is draining from but there's a lot of new skin speckled trout and in fact there's more new skin that there are open areas of weeping and drainage at this point. This is good news. With  regard to the right lower extremity this is doing much better with the only open area that I really see being a dry spot on the right lateral ankle currently. Fortunately there's no signs of active infection at this time which is good news. No fevers, chills, nausea, or vomiting noted at this time. The patient seems somewhat stressed and overwhelmed during the visit today she was very lethargic as such. She does and she is not taking any pain medications at this point. Apparently according to her husband are also in the process of moving which is probably taking its toll on her as well. 10/06/18 on evaluation today patient appears to be doing rather well in regard to her lower extremities compared to last evaluation. Fortunately there's no signs of active infection. She tells me she did have an appointment  with her primary. Nonetheless he was concerned that the wounds were somewhat deep based on pictures but we never actually saw her legs. She states that he had her somewhat worried due to the fact that she was fearing now that she was [place] have to have an amputation. With that being said based on what I'm seeing check she looks better this week that she has the last two times I've seen her with much less drainage I'm actually pleased in this regard. That doesn't mean that she's out of the water but again I do not think what the point of talking about education at all in regard to her leg. She is very happy to hear this. She is also not having as much pain as she was having last week. 10/13/18 unfortunately on evaluation today patient still continues to have a significant amount of drainage she's not letting home health actually apply the compression dressings at this point. She's trying to use of Juxta-Lite of the top of Kerlex and the second layer of the three layer compression wrap. With that being said she just does not seem to be making as good a progress as I would expect if she was having the  compression applied and in place on a regular basis. No fevers, chills, nausea, or vomiting noted at this time. 10/20/18 on evaluation today patient appears to be doing a little better in regard to her bilateral lower extremity ulcers. In fact the right lower extremity seems to be healed she doesn't even have any openings at this point left lower extremity though still somewhat macerated seems to be showing signs of new skin growth at multiple locations throughout. Fortunately there's no evidence of active infection at this time. No fevers, chills, nausea, or vomiting noted at this time. 10/27/18 on evaluation today patient appears to be doing much better in regard to her left lower Trinity ulcer. She's been tolerating the laptop complication and has minimal drainage noted at this point. Fortunately there's no signs of active infection at this time. No fevers, chills, nausea, or vomiting noted at this time. 11/03/18 on evaluation today patient actually appears to be doing excellent in regard to her left lower extremity. She is having very little drainage at this point there does not appear to be any significant signs of infection overall very pleased with how things have gone. She is likewise extremely pleased still and seems to be making wonderful progress week to week. I do believe antibiotics were helpful for her. Her primary care provider did place on amateur clean since I last saw her. 11/17/18 on evaluation today patient appears to be doing worse in regard to her bilateral lower extremities at this point. She is been tolerating the dressing changes without complication. With that being said she typically takes the [place] off fairly quickly upon arriving home even after being seen here in the clinic and does not allow home health reapply command as part of the dressing at home. Therefore she said no compression essentially since I last saw her as best I can tell. With that being said I think it shows  and how much swelling she has in the open wounds that are noted at this point. Fortunately there's no signs of infection but unfortunately if she doesn't get this under control I think she will end up with infection and more significant issues. 11/24/18 on evaluation today patient actually appears to be doing somewhat better in regard to her bilateral lower extremities. She still  tells me she has not been using her compression pumps she tells me the reason is that she had gout of her right great toe and listen to much pain to do this over the past week. Nonetheless that is doing better currently so she should be able to attempt reinitiating the lymphedema pumps at this time. No fevers, chills, nausea, or vomiting noted at this time. 12/01/18 upon evaluation today patient's left lower extremity appears to be doing quite well unfortunately her right lower extremity is not doing nearly as well. She has been tolerating the dressing changes without complication unfortunately she did not keep a wrap on the right at this time. Nonetheless I believe this has led to increased swelling and weeping in the world is actually much larger than during the last evaluation with her. 12/08/18 on evaluation today patient appears to be doing about the same at this point in regard to her right lower extremity. There is some more palatable to touch I'm concerned about the possibility of there being some infection although I think the main issue is she's not keeping her compression wrap on which in turn is not allowing this area to heal appropriately. 12/22/18 on evaluation today patient appears to be doing better in regard to left lower extremity unfortunately significantly worse in regard to the right lower extremity. The areas of blistering and necrotic superficial tissue have spread and again this does not really appear to be signs of infection and all she just doesn't seem to be doing nearly as well is what she has been in  the past. Overall I feel like the Augmentin did absolutely nothing for her she doesn't seem to have any infection again I really didn't think so last time either is more of a potential preventative measure and hoping that this would make some difference but I think the main issue is she's not wearing her compression. She tells me she cannot wear the [place] been we put on she takes it off pretty much upon getting home. Subsequently she worshiped Juxta-Lite when I questioned her about how often she wears it this is no more than three hours a day obviously that leaves 21 hours that she has no compression and this is obviously not doing well for her. Overall I'm concerned that if things continue to worsen she is at great risk of both infection as well as losing her leg. 01/05/19 on evaluation today patient appears to be doing well in regard to her left lower extremity which he is allowing [place] to wrap and not so well with regard to her right lower extremity which she is not allowing [place] to really wrap and keep the wrap on. She states that it hurts too badly whenever it's wrapped and she ends up having to take it off. She's been using the Juxta-Lite she tells me up to six hours a day although I question whether or not that's really been the case to be honest. Previously she told me three hours today nonetheless obviously the legs as long as the wrap is doing great when she is not is doing much more poorly. 01/12/2019 on evaluation today patient actually appears to be doing a little better in my opinion with regard to her right lower extremity ulcer. She has a small open area on the left lower extremity unfortunately but again this I think is part of the normal fluctuation of what she is going to have to expect with regard to her legs especially when she  is not using her lymphedema pumps on a regular basis. Subsequently based on what I am seeing today I think that she does seem to be doing slightly better with  regard to her right lower extremity she did see her primary care provider on Monday they felt she had an infection and placed her on 2 antibiotics. Both Cipro and clindamycin. Subsequently again she seems possibly to be doing a little bit better in regards to the right lower extremity she also tells me however she has been wearing the compression wrap over the past week since I spoke with her as well that is a Kerlix and Coban wrap on the right. No fevers, chills, nausea, vomiting, or diarrhea. 01/19/2019 on evaluation today patient appears to be doing better with regard to her bilateral lower extremities especially the right. I feel like the compression has been beneficial for her which is great news. She did get a call from her primary care provider on her way here today telling her that she did have methicillin- resistant Staphylococcus aureus and he was calling in a couple new antibiotics for her including a ointment to be applied she tells me 3 times a day. With that being said this sounds like likely to be Bactroban which I think could be applied with each dressing/wrap change but I would not be able to accommodate her applying this 3 times a day. She is in agreement with the least doing this we will add that to her orders today. 01/26/2019 on evaluation today patient actually appears to be doing much better with regard to her right lower extremity. Her left lower extremity is also doing quite well all things considering. Fortunately there is no evidence of active infection at this time. No fevers, chills, nausea, vomiting, or diarrhea. 02/02/2019 on evaluation today patient appears to be doing much better compared to her last evaluation. Little by little off like her right leg is returning more towards normal. There does not appear to be any signs of active infection and overall she seems to be doing quite well which is great news. I am very pleased in this regard. No fevers, chills, nausea,  vomiting, or diarrhea. 02/09/2019 upon evaluation today patient appears to be doing better with regard to her bilateral lower extremities. She has been tolerating the dressing changes without complication. Fortunately there is no signs of active infection at this time. No fevers, chills, nausea, vomiting, or diarrhea. 02/23/2019 on evaluation today patient actually appears to be doing quite well with regard to her bilateral lower extremities. She has been tolerating the dressing changes without complication. She is even used her pumps one time and states that she really felt like it felt good. With that being said she seems to be in good spirits and her legs appear to be doing excellent. 03/09/2019 on evaluation today patient appears to be doing well with regard to her right lower extremity there are no open wounds at this time she is having some discomfort but I feel like this is more neuropathy than anything. With regard to her left lower extremity she had several areas scattered around that she does have some weeping and drainage from but again overall she does not appear to be having any significant issues and no evidence of infection at this time which is good news. 03/23/2019 on evaluation today patient appears to be doing well with regard to her right lower extremity which she tells me is still close she is using her juxta light  here. Her left lower extremity she mainly just has an area on the foot which is still slightly draining although this also is doing great. Overall very pleased at this time. 04/06/2019 patient appears to be doing a little bit worse in regard to her left lower extremity upon evaluation today. She feels like this could be becoming infected again which she had issues with previous. Fortunately there is no signs of systemic infection but again this is always a struggle with her with her legs she will go from doing well to not so well in a very short amount of time. 04/20/2019 on  evaluation today patient actually appears to be doing quite well with regard to her right lower extremity I do not see any signs of active infection at this time. Fortunately there is no fever chills noted. She is still taking the antibiotics which I prescribed for her at this point. In regard to the left lower extremity I do feel like some of these areas are better although again she still is having weeping from several locations at this time. 04/27/2019 on evaluation today patient appears to be doing about the same if not slightly worse in regard to her left lower extremity ulcers. She tells me when questioned that she has been sleeping in her Hoveround chair in fact she tells me she falls asleep without even knowing it. I think she is spending a whole lot of time in the chair and less time walking and moving around which is not good for her legs either. On top of that she is in a seated position which is also the worst position she is not really elevating her legs and she is also not using her lymphedema pumps. All this is good to contribute to worsening of her condition in general. 05/18/2019 on evaluation today patient appears to be doing well with regard to her lower extremity on the right in fact this is showing no signs of any open wounds at this time. On the left she is continuing to have issues with areas that do drain. Some of the regions have healed and there are couple areas that have reopened. She did go to the ER per the patient according to recommendations from the home health nurse due to what she was seen when she came out on 05/13/2019. Subsequently she felt like the patient needed to go to the hospital due to the fact that again she was having "milky white discharge" from her leg. Nonetheless she had and then was placed on doxycycline and subsequently seems to be doing better. 06/01/2019 upon evaluation today patient appears to be doing really in my opinion about the same. I do not  see any signs of active infection which is good news. Overall she still has wounds over the bilateral lower extremities she has reopened on the right but this appears to be more of a crack where there is weeping/edema coming from the region. I do not see any evidence of infection at either site based on what I visualized today. 07/13/2019 upon evaluation today patient appears to be doing a little worse compared to last time I saw her. She since has been in the hospital from 06/21/2019 through 06/29/2019. This was secondary to having Covid. During that time they did apply lotion to her legs which unfortunately has caused her to develop a myriad of open wounds on her lower extremities. Her legs do appear to be doing better as far as the overall appearance is concerned  but nonetheless she does have more open and weeping areas. 07/27/2019 upon evaluation today patient appears to be doing more poorly to be honest in regard to her left lower extremity in particular. There is no signs of systemic infection although I do believe she may have local infection. She notes she has been having a lot of blue/green drainage which is consistent potentially with Pseudomonas. That may be something that we need to consider here as well. The doxycycline does not seem to have been helping. 08/03/2019 upon evaluation today patient appears to be doing a little better in my opinion compared to last week's evaluation. Her culture I did review today and she is on appropriate medications to help treat the Enterobacter that was noted. Overall I feel like that is good news. With that being said she is unfortunately continuing to have a lot of drainage and though it is doing better I still think she has a long ways to go to get things dried up in general. Fortunately there is no signs of systemic infection. 08/10/2019 upon evaluation today patient appears to be doing may be slightly better in regard to her left lower extremity the  right lower extremity is doing much better. Fortunately there is no signs of infection right now which is good news. No fevers, chills, nausea, vomiting, or diarrhea. 08/24/2019 on evaluation today patient appears to be doing slightly better in regard to her lower extremities. The left lower extremity seems to be healed the right lower extremity is doing better though not completely healed as far as the openings are concerned. She has some generalized issues here with edema and weeping secondary to her lymphedema though again I do believe this is little bit drier compared to prior weeks evaluations. In general I am very pleased with how things seem to be progressing. No fevers, chills, nausea, vomiting, or diarrhea. 08/31/2019 upon evaluation today patient actually seems to making some progress here with regard to the left lower extremity in particular. She has been tolerating the dressing changes without complication. Fortunately there is no signs of active infection at this time. No fevers, chills, nausea, vomiting, or diarrhea. She did see Dr. Doren Custard and he did note that she did have a issue with the left great saphenous vein and the small saphenous vein in the leg. With that being said he was concerned about the possibility of laser ablation not being extremely successful. He also mentioned a small risk of DVT associated with the procedure. However if the wounds do not continue to improve he stated that that would probably be the way to go. Fortunately the patient's legs do seem to be doing much better. 09/07/2019 upon evaluation today patient appears to be doing better with regard to her lower extremities. She has been tolerating the dressing changes without complication. With that being said she is showing signs of improvement and overall very pleased. There are some areas on her leg that I think we do need to debride we discussed this last week the patient is in agreement with doing that as long as  it does not hurt too badly. 09/14/2019 upon evaluation today patient appears to be doing decently well with regard to her left lower extremity. She is not having near as much weeping as she has had in the past things seem to be drying up which is good news. There is no signs of active infection at this time. 09/21/19 upon evaluation today patient appears to be doing better in regard  overall to her bilateral lower extremities. She again has less open than she did previous and each week I feel like this is getting better. Fortunately there is no signs of active infection at this time. No fevers, chills, nausea, vomiting, or diarrhea. 09/28/2019 upon evaluation today patient actually appears to be showing signs of improvement with regard to her left lower extremity. Unfortunately the right medial lower extremity around the ankle region has reopened to some degree but this appears to be minimal still which is good news. There is no signs of active infection at this time which is also good news. 10/12/2019 upon evaluation today patient appears to be doing okay with regard to her bilateral lower extremities today. The right is a little bit worse then last evaluation 2 weeks ago. The left is actually doing a little better in my opinion. Overall there is no signs of active infection at this time that I see. Obviously that something we have to keep a close eye on she is very prone to this with the significant and multiple openings that she has over the bilateral lower extremities. 10/19/2019 upon evaluation today patient appears to be doing about the best that I have seen her in quite some time. She has been tolerating the dressing changes without complication. There does not appear to be any signs of active infection and overall I am extremely happy with the way her legs appeared. She is drying up quite nicely and overall is having less pain. 11/09/2019 upon evaluation today patient appears to be doing better in  regard to her wounds. She seems to be drying up more and more each time I see her this is just taking a very long time. Fortunately there is no signs of active infection at this time. 11/23/2019 upon evaluation today patient actually appears to be doing excellent in regard to her lower extremities at this point compared to where she has been. Fortunately there is no signs of active infection at this time. She did go to the hospital last week for nausea and vomiting completely unrelated to her wounds. Fortunately she is doing better she was given some Reglan and got better. She had associated abdominal pain but they never found out what was going on. 12/07/2019 upon evaluation today patient appears to be doing well for the most part in regard to her legs. She unfortunately has not been keeping the Coban portion of her wraps on therefore the compression has not really been sufficient for what it is supposed to be. Nonetheless she tells me that it just hurt too bad therefore she removed it. 12/21/2019 upon evaluation today patient actually appears to be doing quite well with regard to her legs. I do feel like she has been making progress which is great news and overall there is no signs of active infection at this time. No fevers, chills, nausea, vomiting, or diarrhea. 01/04/2020 upon evaluation today patient presents for follow-up concerning her lower extremity edema bilaterally. She still has open wounds she has not been using her lymphedema pumps. She is also not been utilizing her compression wraps appropriately she tends to unwrap them, take them off, or states that they hurt. Obviously the reason they hurt is because her legs start to swell but the issue is if she would use her compression/lymphedema pumps regularly she would not swell and she would have the pain. Nonetheless she has not even picked them up once honestly over the past several months and may be even as  much as in the past year based on my  opinion and what have seen. She tells me today that after last week when I talked about this with her specifically actually that was 2 weeks ago that she "forgot". 8//21 on evaluation today patient appears to be doing a little better in regard to her legs bilaterally. Fortunately there is no signs of active infection at this time. She tells me that she used her lymphedema pumps all of one time over the past 2 weeks since I last saw her. She tells me that she has been too busy in order to continue to use these. 02/01/2020 on evaluation today patient appears to be doing some better in regard to her wounds in general in her legs. We felt the right was healed although is not completely it does appear to be doing better she tells me she has been using her lymphedema pumps that she has had this six times since I last saw her. Obviously the more she does that the better she would do my opinion 02/15/2020 upon evaluation today patient appears to be doing about the same in regard to her legs. She tells me that she is pumping I'm still not sure how much she does to be perfectly honest. However even if she does a little bit here and there I guess that is better than nothing. Fortunately there is no sign of active infection at this time which is great news. No fevers, chills, nausea, vomiting, or diarrhea. 02/29/2020 on evaluation today patient actually appears to be doing quite well all things considered this week. She has been tolerating the dressing changes without complication. Fortunately there is no signs of active infection at this time. No fevers, chills, nausea, vomiting, or diarrhea. 03/14/2020 upon evaluation today patient appears to be doing really about the same in regard to her legs. There is no signs of improvement overall and she as noted from home health does not appear to be elevating her legs he can get into her lift chair. There is too much stuff piled up on it the patient tells me. She also tells  me she cannot really use her pumps effectively due to the fact that she cannot have any space to get them on. Finally she is also not really elevating her legs because she is not sleeping in her bed she is sleeping in her chair currently and again overall I think everything that she is done in combination has been exactly the wrong thing for what she needs for her legs. 04/04/2020 upon evaluation today patient appears to be doing well at this time with regard to her legs. She is actually been pumping, keeping her wraps on, and to be honest she seems to be doing dramatically better the right leg is excellent the left leg is also excellent and measuring much smaller than previous. 04/18/2020 upon evaluation today patient actually is continue to make good progress in regard to her lower extremities bilaterally. Everything is improving and less wet that has been in the past overall I am extremely pleased with where things stand and I think that she is making great progress. The patient tells me she still continue to use her compression pumps 05/02/2020 on evaluation today patient appears to be doing well at this time in regard to her left leg which is showing signs of drying up. With that being said she does have a lot of lymphedema type crusty skin around the toes of her left foot and  the right medial ankle which has opened at this point. Fortunately there is no signs of active infection systemically at this point or even locally for that matter. 05/23/2020 on evaluation today patient appears to be doing well with regard to her lower extremities. Fortunately there is no signs of active infection at this time. No fever chills noted. She has been very depressed however she tells me. 06/06/2020 patient came in today for evaluation in regard to her bilateral lower extremity ulcerations. With that being said she came in feeling okay and actually laughing and joking around with the staff checking her in.  Subsequently however she had a coughing spell and following the coughing spell it was a dramatic conversion from being jovial and joking around to being extremely short of breath her vital signs actually dropped in regard to her blood pressure from around 175 to down around 678 for systolic and from around 96 diastolic down to around 70. With that being said she also accompanied this with an increase in her respiratory rate which was also quite significant. Nonetheless I actually upon going into see her was extremely worried we called EMS to have her transported to the ER for further evaluation and treatment. She continued until EMS got here to be extremely short of breath even on 6 L of oxygen. Her oxygen saturation did come up to 99% but overall it was only 95 before which was not terrible she did feel like the oxygen helped her feel somewhat better however. She has a history of asthma but again even the coughing spell really should not have done this degree of alteration in her demeanor from where she was just before to after the coughing spell. She tells [place] however she has been feeling somewhat abnormal since Friday although she could not really pinpoint exactly what was going on. 06/27/2020 plan evaluation today patient appears to be doing okay in regard to her leg ulcers. She is really not showing a lot of improvement to be honest and she still has a lot of weeping. With that being said after I last saw her we called EMS to take her to the hospital she actually did not go she went to see her primary care provider who according to the patient have not identified and read through the note states that everything checked out okay. Nonetheless the patient has continued to have bouts where she gets very short of breath for seemingly no reason whatsoever. That happened even the same night that I saw her last time. This was after she refused transport to the hospital. With that being said she also tells me  that just getting dressed to come here is quite a chore which is putting on her shirt causing her to have to stop to catch her breath. Obviously this is not normal and I feel like there is something going on. She may need a referral to be seen by cardiology ASAP. She does see her primary care provider early next week she tells me 07/11/2020 upon evaluation today patient's wounds again appear to be doing about the same she mainly has weeping of the bilateral lower extremities both are very swollen today she tells me she has been using her pumps but last time I saw her she told me she did not even have her pumps out that she had "packed them away because they had too much stuff in their house. Apparently her husband has been bringing stuff in from storage units. She then subsequently  told me that she had so much stuff in her house that she has been staying up all night and did not go to bed till 7:00 this morning because she was cleaning up stuff. Nonetheless I am still unsure as to whether or not she is using the pumps based on what I am seeing I would think probably not. Nonetheless she has been keeping the compression wraps in place that is at least something 07/25/2020 upon evaluation today patient appears to be doing well currently in regard to her legs all things considered. I do not think she is doing any worse significantly although honestly I do not think she is doing a lot better either. There does not appear to be any signs of infection which is good news although she does have a lot of weeping. She is using her pumps she tells me sporadically though she says that her husband is not a big fan of helping her to get the [place]. She also tells me is difficult because in their house. It would be easiest for her to use these as where her son is actually staying with her and living out at this point. Obviously I understand that she is having a lot of issues here and to be honest I do not really know  what to say in that regard and the fact that I really cannot change the situations but I do believe she really needs to be using the lymphedema pumps to try to keep things under control here. Outside of that I think she is going to continue to have significant issues. I think the pumps coupled with good compression and elevation are the only things that are to help her at this point. 08/08/2020 upon evaluation today patient appears to be doing a little worse in regard to her legs in general. I think this may be due to some infection currently. With that being said I am going to go ahead and likely see about putting her on an antibiotic. Were also can obtain a culture today where she had some purulent drainage. 08/29/2020 upon evaluation today patient appears to be doing about the same in regard to her bilateral lower extremities. Unfortunately she is continuing to have significant issues here with edema and she has significant lymphedema. With that being said she is really not doing anything that she is supposed to be doing as far as elevation, compression, using lymphedema pumps or anything really for that matter. She does not use her pumps, is taken the wraps off shortly after having them put on as far as home health is concerned at least by the next day she tells me, and overall is not really elevating her legs she is telling me she has a lot to do as far as cleaning up in her house. Nonetheless this triad of noncompliance is leading to worsening not improving in regard to her legs. I had a very strong conversation with her about this today again. 09/12/2020 on evaluation today patient appears to be doing poorly with regard to her lower extremities bilaterally. Fortunately there does not appear to be any signs of active infection at this point. That is systemically. Locally there is some signs of pus coming from an area on her left leg. She has been on antibiotics recently though she is done with  those currently. Nonetheless she tells me she has been having increased pain with the right leg this is extremely swollen as well. Fortunately there does not appear to  be significant infection of the right leg though I think her pain is really as result of the increased swelling she has been declining the wrap on the right leg and the wounds here are getting significantly worse week by week. This obviously is not will be want to see. I discussed with the patient however if she does not use the wraps, use her pumps, and elevate her legs that she is not to get better. She has been sleeping in her motorized Hoveround which does not even allow her to elevate her legs at all. She tells me that she is also constantly "cleaning up as her husband told her that he wants to move back to [place]." Unfortunately this has been the narrative for the past several months in fact that she tells me that she has been having to clean up mass that was moved in from storage units into their home and that she does not really sleep and she never really elevates her legs. I explained to the patient today that if she is not can I do any of this that there is really nothing that I can do to help her. 09/26/20 upon evaluation today patient appears to be doing about the same in regard to her wounds. Fortunately there is no signs of active infection at this time. No fever chills noted. She has been tolerating the dressing changes without complication which is good news. With that being said I do think that she unfortunately is still having a lot of pain but I think this is due to the swelling that really is not controlled and as I discussed with her last time she needs to be using her lymphedema pumps there is still in the box she has not even attempted to use those whatsoever. I am also not even certain she is taking her fluid pills as she states she may need to have "get a refill" she was seen with her husband at the  appointment today. 10/24/2020 upon evaluation today patient appears to be doing decently well in regard to her legs all things considered. She has been tolerating the dressing changes without complication. Fortunately there is no signs of active infection at this time. No fevers, chills, nausea, vomiting, or diarrhea. With that being said the patient does appear to have some excessive thickened skin buildup currently that has been require some sharp debridement to clear this away. I am hopeful if we do this we will be able to get some of this area to dry out this otherwise trapping fluid underneath. Her son is present during the office visit today. She tells me she still been doing a lot as far as cleaning up and going through boxes she tells me she has been up for a long time already this morning. 5/25; patient presents for 2-week follow-up. She had 3 layer compression wrap placed with calcium alginate underneath. She reports tolerating the wraps well. She has home health that changes the wrap 2-3 times a week. She denies signs of infection. She has almost finished her course of antibiotics. She uses her lymphedema pumps once weekly. 11/21/2020 upon evaluation today patient appears to be doing well with regard to her legs all things considered. Fortunately there is no signs of active infection at this time which is great news I do not see any evidence of infection and overall I think that she is definitely improved compared to where things did previous. Nonetheless I do think that she may benefit from  removing some of the thicker skin on the legs. Again we have attempted this in the past to some degree but I think that we might be able to more effectively do this of a clear some way and then potentially use a urea cream with salicylic acid to try to help clear some of this away. The patient is in agreement with the plan. 12/05/2020 upon evaluation today patient appears to be doing well with regard to her  wounds in general. I feel like that things are somewhat improved the skin looking a little better. With that being said she still has significant lymphedema. She is not been using her lymphedema pumps and not really been elevating her legs. She also did have some maggots noted on the left leg she tells me that the "gnats are terrible and get all over her foot." With that being said I am concerned about the fact that the patient still is quite swollen even with the compression wraps I really feel like she needs to be using her pumps but I cannot talk her into doing this. 12/19/2020 upon evaluation today patient appears to be doing about the same in regard to her legs. Fortunately I do not see any signs of infection unfortunately she really seems to be very nonchalant about the fact that she has not been using her lymphedema pumps. She tells me that "I just forget". With that being said this needs to be something that is a priority for her I cannot count the number of times that have had this discussion with her and yet nothing seems to change. With that being said I think that is very unlikely she is ever really getting healed with regard to her wounds to be honest she does not seem to be motivated to do anything to try to help at this point despite everything that I have tried to do and recommend for her. 01/02/2021 upon evaluation today patient actually appears to be doing excellent in regard to her wounds. She has been tolerating the dressing changes without complication. Fortunately there does not appear to be any signs of active infection at this time which is great news. No fevers, chills, nausea, vomiting, or diarrhea. 01/23/2021 upon evaluation today patient appears to be doing worse in general in regard to her wounds. This is due to the fact that she is not been using her compression pumps which again she knows when she does not it always turns out poorly for her. Unfortunately the issue here today  is that she is continuing to have troubles with her son she seems to be very depressed and I am truly sorry for her and everything she is going through. With that being said I do not know what to do to make things better if she want elevate her legs and use her lymphedema pumps there is really not have anything I can do to correct or fix the situation for her to be honest. I discussed all this with her today. 02/20/2021 upon evaluation today the patient unfortunately has been in the hospital due to cellulitis. Again this is something that is been an ongoing issue based on what I am seeing I think that she unfortunately still is having trouble complying with anything that we recommend. She has not been sleeping in her bed she tells me that she continues sleeping really in her motorized wheelchair that sweats been the case for the past several visits that have seen her despite me counseling to the  opposite that she should be trying to sleep in her bed where she could get her legs off the ground and allow some of the swelling edema to come down. Subsequently also it appears that someone not sure his primary care otherwise has given orders for her to not be wrapped and subsequently to be washing her legs daily which she is not doing. This was prior to her going into the hospital. She also tells me she is not been using her lymphedema pumps even before the infection setting and she went to the hospital. She also tells me that she does not always take her fluid pills and again the compression wraps have not been in place. Pretty much she has not done anything that I recommended up to this point. Again I had this conversation with her multiple times and unfortunately just does not seem to be sinking in that she really needs to be taking care of herself better. She is extremely noncompliant. Patient History Information obtained from Patient. Family History Cancer - Siblings, Hypertension - Siblings, Stroke -  Father, No family history of Diabetes, Heart Disease, Hereditary Spherocytosis, Kidney Disease, Lung Disease, Seizures, Thyroid Problems, Tuberculosis. Social History Never smoker, Marital Status - Married, Alcohol Use - Never, Drug Use - No History, Caffeine Use - Never. Medical History Eyes Denies history of Cataracts, Glaucoma, Optic Neuritis Ear/Nose/Mouth/Throat Denies history of Chronic sinus problems/congestion, Middle ear problems Hematologic/Lymphatic Denies history of Anemia, Hemophilia, Human Immunodeficiency Virus, Lymphedema, Sickle Cell Disease Respiratory Patient has history of Asthma Denies history of Aspiration, Chronic Obstructive Pulmonary Disease (COPD), Pneumothorax, Sleep Apnea, Tuberculosis Cardiovascular Patient has history of Hypertension, Peripheral Arterial Disease, Peripheral Venous Disease - chronic Denies history of Angina, Arrhythmia, Congestive Heart Failure, Coronary Artery Disease, Deep Vein Thrombosis, Hypotension, Myocardial Infarction, Phlebitis, Vasculitis Endocrine Patient has history of Type II Diabetes Genitourinary Denies history of End Stage Renal Disease Immunological Denies history of Lupus Erythematosus, Raynaudoos, Scleroderma Musculoskeletal Patient has history of Gout, Osteoarthritis Denies history of Rheumatoid Arthritis, Osteomyelitis Neurologic Denies history of Dementia, Neuropathy, Quadriplegia, Paraplegia, Seizure Disorder Psychiatric Denies history of Anorexia/bulimia, Confinement Anxiety Hospitalization/Surgery History - leg cellulitis. - cellulitis BLE cone 02/08/2021. Medical A Surgical History Notes nd Constitutional Symptoms (General Health) morbid obesity, wheelchair dependent Integumentary (Skin) LLL cellulitis , wound cx grew E. Coli Psychiatric insomnia , adjustment disorder, depression with anxiety Objective Constitutional Obese and well-hydrated in no acute distress. Vitals Time Taken: 3:12 PM, Height: 62  in, Weight: 335 lbs, BMI: 61.3, Temperature: 99.1 F, Pulse: 87 bpm, Respiratory Rate: 20 breaths/min, Blood Pressure: 195/76 mmHg, Capillary Blood Glucose: 242 mg/dl. Respiratory normal breathing without difficulty. Psychiatric this patient is able to make decisions and demonstrates good insight into disease process. Alert and Oriented x 3. pleasant and cooperative. General Notes: Upon inspection patient's wounds again on her legs she is much more widespread they have been using Xeroform which obviously is made things extremely wet. With that being said I think that the alginate is a much better way to go but nonetheless I think she also needs to be compression wrap which has not been done. This needs to be done 3 times a week and again that is also not happening. With that being said I am very concerned about the fact of the erythema that is noted on the right ankle anteriorly which again she appears to still be showing signs of infection this is not good. She is still taking the Augmentin that she was given at the hospital and  she showed me the bottle today. Integumentary (Hair, Skin) Wound #61 status is Open. Original cause of wound was Gradually Appeared. The date acquired was: 04/20/2019. The wound has been in treatment 96 weeks. The wound is located on the Left,Circumferential Lower Leg. The wound measures 18cm length x 31.5cm width x 0.1cm depth; 445.321cm^2 area and 44.532cm^3 volume. There is Fat Layer (Subcutaneous Tissue) exposed. There is no tunneling or undermining noted. There is a large amount of serosanguineous drainage noted. The wound margin is indistinct and nonvisible. There is no granulation within the wound bed. There is a large (67-100%) amount of necrotic tissue within the wound bed including Adherent Slough. Wound #64 status is Open. Original cause of wound was Gradually Appeared. The date acquired was: 06/01/2019. The wound has been in treatment 90 weeks. The wound is  located on the Left,Dorsal Foot. The wound measures 8.6cm length x 8.5cm width x 0.1cm depth; 57.413cm^2 area and 5.741cm^3 volume. There is Fat Layer (Subcutaneous Tissue) exposed. There is no tunneling or undermining noted. There is a large amount of serous drainage noted. The wound margin is indistinct and nonvisible. There is no granulation within the wound bed. There is a large (67-100%) amount of necrotic tissue within the wound bed including Adherent Slough. Wound #67 status is Open. Original cause of wound was Gradually Appeared. The date acquired was: 05/02/2020. The wound has been in treatment 42 weeks. The wound is located on the Right,Medial Lower Leg. The wound measures 13cm length x 17cm width x 0.1cm depth; 173.573cm^2 area and 17.357cm^3 volume. There is Fat Layer (Subcutaneous Tissue) exposed. There is no tunneling or undermining noted. There is a large amount of serosanguineous drainage noted. The wound margin is indistinct and nonvisible. There is small (1-33%) pink, pale granulation within the wound bed. There is a large (67-100%) amount of necrotic tissue within the wound bed including Adherent Slough. Wound #71 status is Open. Original cause of wound was Gradually Appeared. The date acquired was: 01/23/2021. The wound has been in treatment 4 weeks. The wound is located on the Right,Posterior Lower Leg. The wound measures 6.6cm length x 3.2cm width x 0.1cm depth; 16.588cm^2 area and 1.659cm^3 volume. There is Fat Layer (Subcutaneous Tissue) exposed. There is no tunneling or undermining noted. There is a large amount of serosanguineous drainage noted. The wound margin is distinct with the outline attached to the wound base. There is no granulation within the wound bed. There is a large (67-100%) amount of necrotic tissue within the wound bed including Adherent Slough. Wound #72 status is Open. Original cause of wound was Gradually Appeared. The date acquired was: 02/20/2021. The wound  is located on the Right,Dorsal Foot. The wound measures 1.8cm length x 2cm width x 0.1cm depth; 2.827cm^2 area and 0.283cm^3 volume. There is Fat Layer (Subcutaneous Tissue) exposed. There is no tunneling or undermining noted. There is a large amount of serosanguineous drainage noted. The wound margin is distinct with the outline attached to the wound base. There is small (1-33%) red granulation within the wound bed. There is a large (67-100%) amount of necrotic tissue within the wound bed including Adherent Slough. Wound #73 status is Open. Original cause of wound was Gradually Appeared. The date acquired was: 02/20/2021. The wound is located on the Right,Lateral Lower Leg. The wound measures 3.5cm length x 2cm width x 0.1cm depth; 5.498cm^2 area and 0.55cm^3 volume. There is Fat Layer (Subcutaneous Tissue) exposed. There is no tunneling or undermining noted. There is a large amount of  serosanguineous drainage noted. The wound margin is distinct with the outline attached to the wound base. There is small (1-33%) pink, pale granulation within the wound bed. There is a large (67-100%) amount of necrotic tissue within the wound bed including Adherent Slough. 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 There was a Three Layer Compression Therapy Procedure by [name], RN. Post procedure Diagnosis Wound #61: Same as Pre-Procedure Wound #67 Pre-procedure diagnosis of Wound #67 is a Diabetic Wound/Ulcer of the Lower Extremity located on the Right,Medial Lower Leg . There was a Three Layer Compression Therapy Procedure by [name], RN. Post procedure Diagnosis Wound #67: Same as Pre-Procedure Plan Follow-up Appointments: Return Appointment in 2 weeks. Bathing/ Shower/ Hygiene: May shower and wash wound with soap and water. - with dressing changes, wash both legs with wash cloth and soap and water with dressing changes Other Bathing/Shower/Hygiene Orders/Instructions: - Home health to wash legs with warn soapy water and a clean wash cloth with dressing changes Edema Control - Lymphedema / SCD / Other: Lymphedema Pumps. Use Lymphedema pumps on leg(s) 2-3 times a day for 45-60 minutes. If wearing any wraps or hose, do not remove them. Continue exercising as instructed. Elevate legs to the level of the heart or above for 30 minutes daily and/or when sitting, a frequency of: - especially at night Avoid standing for long periods of time. Exercise regularly Home Health: New wound care orders this week; continue Home Health for wound care. May utilize formulary equivalent dressing for wound treatment orders unless otherwise specified. - change dressing to silver alginate, wash legs with warm soapy water and wash cloth with dressing changes Dressing changes to be completed by [facility] on Monday / Wednesday / Friday except when patient has scheduled visit at [facility]. Other Home Health Orders/Instructions: - [id number] Consults ordered were: Infectious Disease - recurring cellulitis and infection to bilateral lower extremities with lymphedema WOUND #61: - Lower Leg Wound Laterality: Left, Circumferential Peri-Wound Care: Sween  Lotion (Moisturizing lotion) (Home Health) 3 x Per Week/30 Days Discharge Instructions: Apply moisturizing lotion to dry skin on legs Prim Dressing: KerraCel Ag Gelling Fiber Dressing, 4x5 in (silver alginate) 3 x Per Week/30 Days ary Discharge Instructions: Apply silver alginate to wound bed as instructed Secondary Dressing: ABD Pad, 8x10 (Home Health) 3 x Per Week/30 Days Discharge Instructions: Apply over primary dressing as directed. Secondary Dressing: Zetuvit Plus 4x4 in (Home Health) 3 x Per Week/30 Days Discharge Instructions: or equivalent extra absorbent pad.Apply over primary dressing as needed. Com pression Wrap: ThreePress (3 layer compression wrap) (Home Health) 3 x Per Week/30 Days Discharge Instructions: Apply three layer compression as directed. Pad bend of ankle with foam or ABD pad. WOUND #64: - Foot Wound Laterality: Dorsal, Left Peri-Wound Care: Sween Lotion (Moisturizing lotion) (Home Health) 3 x Per Week/30 Days Discharge Instructions: Apply  moisturizing lotion to dry skin on legs Prim Dressing: KerraCel Ag Gelling Fiber Dressing, 4x5 in (silver alginate) 3 x Per Week/30 Days ary Discharge Instructions: Apply silver alginate to wound bed as instructed Secondary Dressing: ABD Pad, 8x10 (Home Health) 3 x Per Week/30 Days Discharge Instructions: Apply over primary dressing as directed. Secondary Dressing: Zetuvit Plus 4x4 in (Home Health) 3 x Per Week/30 Days Discharge Instructions: or equivalent extra absorbent pad.Apply over primary dressing as needed. Com pression Wrap: ThreePress (3 layer compression wrap) (Home Health) 3 x Per Week/30 Days Discharge Instructions: Apply three layer compression as directed. Pad bend of ankle with foam or ABD pad. WOUND #67: - Lower Leg Wound Laterality: Right, Medial Peri-Wound Care: Sween Lotion (Moisturizing lotion) (Home Health) 3 x Per Week/30 Days Discharge Instructions: Apply moisturizing lotion to dry skin on legs Prim  Dressing: KerraCel Ag Gelling Fiber Dressing, 4x5 in (silver alginate) 3 x Per Week/30 Days ary Discharge Instructions: Apply silver alginate to wound bed as instructed Secondary Dressing: ABD Pad, 8x10 (Home Health) 3 x Per Week/30 Days Discharge Instructions: Apply over primary dressing as directed. Secondary Dressing: Zetuvit Plus 4x4 in (Home Health) 3 x Per Week/30 Days Discharge Instructions: or equivalent extra absorbent pad.Apply over primary dressing as needed. Com pression Wrap: ThreePress (3 layer compression wrap) (Home Health) 3 x Per Week/30 Days Discharge Instructions: Apply three layer compression as directed. Pad bend of ankle with foam or ABD pad. WOUND #71: - Lower Leg Wound Laterality: Right, Posterior Peri-Wound Care: Sween Lotion (Moisturizing lotion) (Home Health) 3 x Per Week/30 Days Discharge Instructions: Apply moisturizing lotion to dry skin on legs Prim Dressing: KerraCel Ag Gelling Fiber Dressing, 4x5 in (silver alginate) 3 x Per Week/30 Days ary Discharge Instructions: Apply silver alginate to wound bed as instructed Secondary Dressing: ABD Pad, 8x10 (Home Health) 3 x Per Week/30 Days Discharge Instructions: Apply over primary dressing as directed. Secondary Dressing: Zetuvit Plus 4x4 in (Home Health) 3 x Per Week/30 Days Discharge Instructions: or equivalent extra absorbent pad.Apply over primary dressing as needed. Com pression Wrap: ThreePress (3 layer compression wrap) (Home Health) 3 x Per Week/30 Days Discharge Instructions: Apply three layer compression as directed. Pad bend of ankle with foam or ABD pad. WOUND #72: - Foot Wound Laterality: Dorsal, Right Peri-Wound Care: Sween Lotion (Moisturizing lotion) (Home Health) 3 x Per Week/30 Days Discharge Instructions: Apply moisturizing lotion to dry skin on legs Prim Dressing: KerraCel Ag Gelling Fiber Dressing, 4x5 in (silver alginate) 3 x Per Week/30 Days ary Discharge Instructions: Apply silver alginate  to wound bed as instructed Secondary Dressing: ABD Pad, 8x10 (Home Health) 3 x Per Week/30 Days Discharge Instructions: Apply over primary dressing as directed. Secondary Dressing: Zetuvit Plus 4x4 in (Home Health) 3 x Per Week/30 Days Discharge Instructions: or equivalent extra absorbent pad.Apply over primary dressing as needed. Com pression Wrap: ThreePress (3 layer compression wrap) (Home Health) 3 x Per Week/30 Days Discharge Instructions: Apply three layer compression as directed. Pad bend of ankle with foam or ABD pad. WOUND #73: - Lower Leg Wound Laterality: Right, Lateral Peri-Wound Care: Sween Lotion (Moisturizing lotion) (Home Health) 3 x Per Week/30 Days Discharge Instructions: Apply moisturizing lotion to dry skin on legs Prim Dressing: KerraCel Ag Gelling Fiber Dressing, 4x5 in (silver alginate) 3 x Per Week/30 Days ary Discharge Instructions: Apply silver alginate to wound bed as instructed Secondary Dressing: ABD Pad, 8x10 (Home Health) 3 x Per Week/30 Days Discharge Instructions: Apply over primary dressing as directed. Secondary  Dressing: Zetuvit Plus 4x4 in (Home Health) 3 x Per Week/30 Days Discharge Instructions: or equivalent extra absorbent pad.Apply over primary dressing as needed. Com pression Wrap: ThreePress (3 layer compression wrap) (Home Health) 3 x Per Week/30 Days Discharge Instructions: Apply three layer compression as directed. Pad bend of ankle with foam or ABD pad. 1. Based on what I am seeing currently unfortunately this patient continues to be extremely noncompliant. This has been documented over multiple visits over the past several months if not to be honest even years that have been seeing her. With that being said I feel like that we are really nearing the point where I am good I will probably refer her to a different clinic this was even prior to the fact that her husband out in the lobby when she was being discharged was apparently very upset I went  out to talk to them because he was saying that we were not doing anything for her and that he did not understand what I was even doing. With that being said the main thing he was wanting was something for pain as she was stating she was having a lot of pain but she would not really tell my nurse what was hurting. When I went out to go see them they were already gone. Either way the patient has been extremely noncompliant and to be honest I do not think there is much more that I can really do for her. I think that we are going to likely make a referral for her next time she seen and I have a chance to discuss this with her to a different wound care center for her to get treatment and see if there is anything they can do different to help her out. Again I do believe she needs treatment but nonetheless she has been noncompliant with anything that I recommended I do not know what else I can do. 2. With regard to the current measures I Minna recommend silver alginate dressing to try to dry things up I think this is much better than the Xeroform I think she also needs to be in a compression wrap both legs which have previously discussed with her I do not really know why this was discontinued to be perfectly honest. 3. With regard to infection she does need to continue to take the Augmentin also think that she needs to see infectious disease as this seems to still be giving her a lot of pain and a lot of trouble here. We will see patient back for reevaluation in 2 weeks here in the clinic. If anything worsens or changes patient will contact our office for additional recommendations. Electronic Signature(s) Signed: 02/20/2021 5:55:34 PM By: [name] PA-C Entered By: [name] on 02/20/2021 17:55:34 -------------------------------------------------------------------------------- HxROS Details Patient Name: Date of Service: [name]. 02/20/2021 2:45 PM Medical Record Number:  [id number] Patient Account Number: [id number] Date of Birth/Sex: Treating RN: [birth year] (70 y.o. [name] Primary Care Provider: [name] Other Clinician: Referring Provider: Treating Provider/Extender: Sallye Ober, Whitney Weeks in Treatment: 185 Information Obtained From Patient Constitutional Symptoms (General Health) Medical History: Past Medical History Notes: morbid obesity, wheelchair dependent Eyes Medical History: Negative for: Cataracts; Glaucoma; Optic Neuritis Ear/Nose/Mouth/Throat Medical History: Negative for: Chronic sinus problems/congestion; Middle ear problems Hematologic/Lymphatic Medical History: Negative for: Anemia; Hemophilia; Human Immunodeficiency Virus; Lymphedema; Sickle Cell Disease Respiratory Medical History: Positive for: Asthma Negative for: Aspiration; Chronic Obstructive Pulmonary Disease (COPD); Pneumothorax; Sleep  Apnea; Tuberculosis Cardiovascular Medical History: Positive for: Hypertension; Peripheral Arterial Disease; Peripheral Venous Disease - chronic Negative for: Angina; Arrhythmia; Congestive Heart Failure; Coronary Artery Disease; Deep Vein Thrombosis; Hypotension; Myocardial Infarction; Phlebitis; Vasculitis Endocrine Medical History: Positive for: Type II Diabetes Time with diabetes: 2005 Treated with: Insulin Blood sugar tested every day: No Blood sugar testing results: Breakfast: 155; Lunch: 155; Dinner: 160; Bedtime: 155 Genitourinary Medical History: Negative for: End Stage Renal Disease Immunological Medical History: Negative for: Lupus Erythematosus; Raynauds; Scleroderma Integumentary (Skin) Medical History: Past Medical History Notes: LLL cellulitis , wound cx grew E. Coli Musculoskeletal Medical History: Positive for: Gout; Osteoarthritis Negative for: Rheumatoid Arthritis; Osteomyelitis Neurologic Medical History: Negative for: Dementia; Neuropathy; Quadriplegia; Paraplegia;  Seizure Disorder Psychiatric Medical History: Negative for: Anorexia/bulimia; Confinement Anxiety Past Medical History Notes: insomnia , adjustment disorder, depression with anxiety Immunizations Pneumococcal Vaccine: Received Pneumococcal Vaccination: Yes Received Pneumococcal Vaccination On or After 60th Birthday: No Implantable Devices No devices added Hospitalization / Surgery History Type of Hospitalization/Surgery leg cellulitis cellulitis BLE cone 02/08/2021 Family and Social History Cancer: Yes - Siblings; Diabetes: No; Heart Disease: No; Hereditary Spherocytosis: No; Hypertension: Yes - Siblings; Kidney Disease: No; Lung Disease: No; Seizures: No; Stroke: Yes - Father; Thyroid Problems: No; Tuberculosis: No; Never smoker; Marital Status - Married; Alcohol Use: Never; Drug Use: No History; Caffeine Use: Never; Financial Concerns: No; Food, Clothing or Shelter Needs: No; Support System Lacking: No; Transportation Concerns: No Electronic Signature(s) Signed: 02/20/2021 5:29:03 PM By: Deon Pilling Signed: 02/20/2021 6:00:14 PM By: [name] PA-C Entered By: Deon Pilling on 02/20/2021 15:26:55 -------------------------------------------------------------------------------- SuperBill Details Patient Name: Date of Service: [name] 02/20/2021 Medical Record Number: [id number] Patient Account Number: [id number] Date of Birth/Sex: Treating RN: 1951/04/04 (70 y.o. [name] Primary Care Provider: [name] Other Clinician: Referring Provider: Treating Provider/Extender: Melchor Amour Weeks in Treatment: 185 Diagnosis Coding ICD-10 Codes Code Description E11.622 Type 2 diabetes mellitus with other skin ulcer I89.0 Lymphedema, not elsewhere classified I87.331 Chronic venous hypertension (idiopathic) with ulcer and inflammation of right lower extremity I87.332 Chronic venous hypertension (idiopathic) with ulcer and inflammation of left  lower extremity L97.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 leg with fat layer exposed L97.822 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 Physician Procedures : CPT4 Code Description Modifier [id number] - WC PHYS LEVEL 4 - EST PT ICD-10 Diagnosis Description E11.622 Type 2 diabetes mellitus with other skin ulcer I89.0 Lymphedema, not elsewhere classified I87.331 Chronic venous hypertension (idiopathic) with  ulcer and inflammation of right lower extremity I87.332 Chronic venous hypertension (idiopathic) with ulcer and inflammation of left lower extremity Quantity: 1 Electronic Signature(s) Signed: 02/20/2021 5:55:48 PM By: [name] PA-C Entered By: [name] on 02/20/2021 17:55:46

## 2021-02-21 NOTE — Progress Notes (Signed)
JHERI, MITTER ([id number]) Visit Report for 02/20/2021 Arrival Information Details Patient Name: Date of Service: CANDELA, KRUL 02/20/2021 2:45 PM Medical Record Number: [id number] Patient Account Number: [id number] Date of Birth/Sex: Treating RN: [birth year] (70 y.o. [name], Meta.Reding Primary Care Saraya Tirey: [name] Other Clinician: Referring Emrah Ariola: Treating Ellise Kovack/Extender: Melchor Amour Weeks in Treatment: 49 Visit Information History Since Last Visit Added or deleted any medications: Yes Patient Arrived: Wheel Chair Any new allergies or adverse reactions: No Arrival Time: 15:10 Had a fall or experienced change in No Accompanied By: self activities of daily living that may affect Transfer Assistance: Manual risk of falls: Patient Identification Verified: Yes Signs or symptoms of abuse/neglect since last visito No Secondary Verification Process Completed: Yes Hospitalized since last visit: Yes Patient Requires Transmission-Based Precautions: No Implantable device outside of the clinic excluding No Patient Has Alerts: Yes cellular tissue based products placed in the center Patient Alerts: R ABI= 1.01 since last visit: L ABI = .99 Has Dressing in Place as Prescribed: Yes Pain Present Now: Yes Notes Per patient buttock is sore. Per patient has applied zinc oxide and a foam bandage to area. Patient refused for staff to assess to note if a wound. Patient declined. PA made aware. Electronic Signature(s) Signed: 02/20/2021 5:29:03 PM By: Deon Pilling Entered By: Deon Pilling on 02/20/2021 16:35:52 -------------------------------------------------------------------------------- Compression Therapy Details Patient Name: Date of Service: DENALI, SHARMA 02/20/2021 2:45 PM Medical Record Number: [id number] Patient Account Number: [id number] Date of Birth/Sex: Treating RN: July 23, 1950 (70 y.o. [name] Primary Care Ihan Pat: [name]  Other Clinician: Referring Marti Acebo: Treating Rafael Salway/Extender: Melchor Amour Weeks in Treatment: 185 Compression Therapy Performed for Wound Assessment: Wound #61 Left,Circumferential Lower Leg Performed By: Clinician [name], RN Compression Type: Three Layer Post Procedure Diagnosis Same as Pre-procedure Electronic Signature(s) Signed: 02/20/2021 6:09:03 PM By: [name] RN, BSN Entered By: [name] on 02/20/2021 15:51:31 -------------------------------------------------------------------------------- Compression Therapy Details Patient Name: Date of Service: [name] 02/20/2021 2:45 PM Medical Record Number: [id number] Patient Account Number: [id number] Date of Birth/Sex: Treating RN: [birth year] (71 y.o. [name] Primary Care Petr Bontempo: [name] Other Clinician: Referring Chenoa Luddy: Treating Shaketha Jeon/Extender: Melchor Amour Weeks in Treatment: 185 Compression Therapy Performed for Wound Assessment: Wound #67 Right,Medial Lower Leg Performed By: Clinician [name], RN Compression Type: Three Layer Post Procedure Diagnosis Same as Pre-procedure Electronic Signature(s) Signed: 02/20/2021 6:09:03 PM By: [name] RN, BSN Entered By: [name] on 02/20/2021 15:51:31 -------------------------------------------------------------------------------- Encounter Discharge Information Details Patient Name: Date of Service: [name] 02/20/2021 2:45 PM Medical Record Number: [id number] Patient Account Number: [id number] Date of Birth/Sex: Treating RN: 23-Mar-1951 (70 y.o. [name] Primary Care Emery Dupuy: [name] Other Clinician: Referring Ariely Riddell: Treating Donalyn Schneeberger/Extender: Sallye Ober, Whitney Weeks in Treatment: [phone] Encounter Discharge Information Items Discharge Condition: Stable Ambulatory Status: Wheelchair Discharge Destination:  Home Transportation: Private Auto Accompanied By: self Schedule Follow-up Appointment: Yes Clinical Summary of Care: Electronic Signature(s) Signed: 02/20/2021 5:29:03 PM By: Deon Pilling Entered By: Deon Pilling on 02/20/2021 16:49:32 -------------------------------------------------------------------------------- Lower Extremity Assessment Details Patient Name: Date of Service: ALANNA, STORTI 02/20/2021 2:45 PM Medical Record Number: [id number] Patient Account Number: [id number] Date of Birth/Sex: Treating RN: 11-Apr-1951 (70 y.o. [name] Primary Care Gracelin Weisberg: [name] Other Clinician: Referring Nitara Szczerba: Treating Keyanah Kozicki/Extender: Sallye Ober, Whitney Weeks in Treatment: 185 Edema Assessment Assessed: [Left: Yes] [Right: Yes] Edema: [Left: Yes] [Right: Yes] Calf Left: Right:  Point of Measurement: 33 cm From Medial Instep 47 cm 40 cm Ankle Left: Right: Point of Measurement: 9 cm From Medial Instep 31.5 cm 25 cm Vascular Assessment Pulses: Dorsalis Pedis Palpable: [Left:Yes] [Right:Yes] Electronic Signature(s) Signed: 02/20/2021 5:29:03 PM By: Deon Pilling Entered By: Deon Pilling on 02/20/2021 15:24:25 -------------------------------------------------------------------------------- [place] Details Patient Name: Date of Service: [name] 02/20/2021 2:45 PM Medical Record Number: [id number] Patient Account Number: [id number] Date of Birth/Sex: Treating RN: Dec 02, 1950 (70 y.o. [name] Primary Care Asja Frommer: [name] Other Clinician: Referring Raissa Dam: Treating Krystelle Prashad/Extender: Melchor Amour Weeks in Treatment: [place] reviewed with physician Active Inactive Venous Leg Ulcer Nursing Diagnoses: Actual venous Insuffiency (use after diagnosis is confirmed) Knowledge deficit related to disease process and management Goals: Patient will maintain  optimal edema control Date Initiated: 08/12/2017 Target Resolution Date: 03/13/2021 Goal Status: Active Patient/caregiver will verbalize understanding of disease process and disease management Date Initiated: 08/12/2017 Date Inactivated: 04/21/2018 Target Resolution Date: 04/24/2018 Goal Status: Met Interventions: Assess peripheral edema status every visit. Compression as ordered Treatment Activities: Therapeutic compression applied : 08/12/2017 Notes: Wound/Skin Impairment Nursing Diagnoses: Impaired tissue integrity Knowledge deficit related to ulceration/compromised skin integrity Goals: Patient/caregiver will verbalize understanding of skin care regimen Date Initiated: 08/12/2017 Target Resolution Date: 03/13/2021 Goal Status: Active Ulcer/skin breakdown will have a volume reduction of 30% by week 4 Date Initiated: 08/05/2017 Date Inactivated: 09/30/2017 Target Resolution Date: 10/03/2017 Goal Status: Met Ulcer/skin breakdown will have a volume reduction of 50% by week 8 Date Initiated: 09/30/2017 Date Inactivated: 10/28/2017 Target Resolution Date: 10/28/2017 Goal Status: Met Interventions: Assess patient/caregiver ability to perform ulcer/skin care regimen upon admission and as needed Assess ulceration(s) every visit Provide education on ulcer and skin care Screen for HBO Treatment Activities: Patient referred to home care : 08/05/2017 Skin care regimen initiated : 08/05/2017 Topical wound management initiated : 08/05/2017 Notes: Electronic Signature(s) Signed: 02/20/2021 6:09:03 PM By: [name] RN, BSN Entered By: [name] on 02/20/2021 15:50:09 -------------------------------------------------------------------------------- Pain Assessment Details Patient Name: Date of Service: [name] 02/20/2021 2:45 PM Medical Record Number: [id number] Patient Account Number: [id number] Date of Birth/Sex: Treating RN: Apr 17, 1951 (70 y.o. [name] Primary  Care Rosibel Giacobbe: [name] Other Clinician: Referring Malyia Moro: Treating Brandice Busser/Extender: Sallye Ober, Whitney Weeks in Treatment: [phone] Active Problems Location of Pain Severity and Description of Pain Patient Has Paino Yes Site Locations Pain Location: Generalized Pain, Pain in Ulcers Rate the pain. Current Pain Level: 9 Worst Pain Level: 10 Least Pain Level: 0 Tolerable Pain Level: 8 Character of Pain Describe the Pain: Burning, Heavy, Sharp Pain Management and Medication Current Pain Management: Medication: No Cold Application: No Rest: No Massage: No Activity: No T.E.N.S.: No Heat Application: No Leg drop or elevation: No Is the Current Pain Management Adequate: Adequate How does your wound impact your activities of daily livingo Sleep: No Bathing: No Appetite: No Relationship With Others: No Bladder Continence: No Emotions: No Bowel Continence: No Work: No Toileting: No Drive: No Dressing: No Hobbies: No Electronic Signature(s) Signed: 02/20/2021 5:29:03 PM By: Deon Pilling Entered By: Deon Pilling on 02/20/2021 15:23:42 -------------------------------------------------------------------------------- Patient/Caregiver Education Details Patient Name: Date of Service: [name] 9/7/2022andnbsp2:45 PM Medical Record Number: [id number] Patient Account Number: [id number] Date of Birth/Gender: Treating RN: 1950-11-30 (70 y.o. [name] Primary Care Physician: [name] Other Clinician: Referring Physician: Treating Physician/Extender: Melchor Amour Weeks in Treatment: [phone] Education Assessment Education Provided To: Patient Education  Topics Provided Infection: Methods: Explain/Verbal Responses: Reinforcements needed, State content correctly Venous: Methods: Explain/Verbal Responses: Reinforcements needed, State content correctly Wound/Skin Impairment: Methods: Explain/Verbal Responses:  Reinforcements needed, State content correctly Electronic Signature(s) Signed: 02/20/2021 6:09:03 PM By: [name] RN, BSN Entered By: [name] on 02/20/2021 15:50:45 -------------------------------------------------------------------------------- Wound Assessment Details Patient Name: Date of Service: [name] 02/20/2021 2:45 PM Medical Record Number: [id number] Patient Account Number: [id number] Date of Birth/Sex: Treating RN: [birth year] (70 y.o. [name], [name] Primary Care Alaynna Kerwood: [name] Other Clinician: Referring Viyan Rosamond: Treating Eliza Green/Extender: Sallye Ober, Whitney Weeks in Treatment: 185 Wound Status Wound Number: 61 Primary Venous Leg Ulcer Etiology: Wound Location: Left, Circumferential Lower Leg Wound Open Wounding Event: Gradually Appeared Status: Date Acquired: 04/20/2019 Comorbid Asthma, Hypertension, Peripheral Arterial Disease, Peripheral Weeks Of Treatment: 96 History: Venous Disease, Type II Diabetes, Gout, Osteoarthritis Clustered Wound: Yes Photos Wound Measurements Length: (cm) 18 Width: (cm) 31.5 Depth: (cm) 0.1 Area: (cm) 445.321 Volume: (cm) 44.532 % Reduction in Area: -5806.1% % Reduction in Volume: -5806.1% Epithelialization: None Tunneling: No Undermining: No Wound Description Classification: Full Thickness Without Exposed Support Structures Wound Margin: Indistinct, nonvisible Exudate Amount: Large Exudate Type: Serosanguineous Exudate Color: red, brown Foul Odor After Cleansing: No Slough/Fibrino Yes Wound Bed Granulation Amount: None Present (0%) Exposed Structure Necrotic Amount: Large (67-100%) Fascia Exposed: No Necrotic Quality: Adherent Slough Fat Layer (Subcutaneous Tissue) Exposed: Yes Tendon Exposed: No Muscle Exposed: No Joint Exposed: No Bone Exposed: No Treatment Notes Wound #61 (Lower Leg) Wound Laterality: Left, Circumferential Cleanser Peri-Wound Care Sween Lotion  (Moisturizing lotion) Discharge Instruction: Apply moisturizing lotion to dry skin on legs Topical Primary Dressing KerraCel Ag Gelling Fiber Dressing, 4x5 in (silver alginate) Discharge Instruction: Apply silver alginate to wound bed as instructed Secondary Dressing ABD Pad, 8x10 Discharge Instruction: Apply over primary dressing as directed. Zetuvit Plus 4x4 in Discharge Instruction: or equivalent extra absorbent pad.Apply over primary dressing as needed. Secured With Compression Wrap ThreePress (3 layer compression wrap) Discharge Instruction: Apply three layer compression as directed. Pad bend of ankle with foam or ABD pad. Compression Stockings Add-Ons Electronic Signature(s) Signed: 02/20/2021 5:29:03 PM By: Deon Pilling Signed: 02/21/2021 11:10:38 AM By: Sandre Kitty Entered By: Sandre Kitty on 02/20/2021 15:21:22 -------------------------------------------------------------------------------- Wound Assessment Details Patient Name: Date of Service: [name] 02/20/2021 2:45 PM Medical Record Number: [id number] Patient Account Number: [id number] Date of Birth/Sex: Treating RN: Sep 20, 1950 (70 y.o. [name], [name] Primary Care Altin Sease: [name] Other Clinician: Referring Anothy Bufano: Treating Krish Bailly/Extender: Sallye Ober, Whitney Weeks in Treatment: 185 Wound Status Wound Number: 64 Primary Diabetic Wound/Ulcer of the Lower Extremity Etiology: Wound Location: Left, Dorsal Foot Wound Open Wounding Event: Gradually Appeared Status: Date Acquired: 06/01/2019 Comorbid Asthma, Hypertension, Peripheral Arterial Disease, Peripheral Weeks Of Treatment: 90 History: Venous Disease, Type II Diabetes, Gout, Osteoarthritis Clustered Wound: No Photos Wound Measurements Length: (cm) 8.6 Width: (cm) 8.5 Depth: (cm) 0.1 Area: (cm) 57.413 Volume: (cm) 5.741 % Reduction in Area: -34695.8% % Reduction in Volume: -11616.3% Epithelialization: Small  (1-33%) Tunneling: No Undermining: No Wound Description Classification: Grade 2 Wound Margin: Indistinct, nonvisible Exudate Amount: Large Exudate Type: Serous Exudate Color: amber Foul Odor After Cleansing: No Slough/Fibrino Yes Wound Bed Granulation Amount: None Present (0%) Exposed Structure Necrotic Amount: Large (67-100%) Fascia Exposed: No Necrotic Quality: Adherent Slough Fat Layer (Subcutaneous Tissue) Exposed: Yes Tendon Exposed: No Muscle Exposed: No Joint Exposed: No Bone Exposed: No Treatment Notes Wound #64 (Foot) Wound Laterality: Dorsal, Left Cleanser Peri-Wound Care Sween  Lotion (Moisturizing lotion) Discharge Instruction: Apply moisturizing lotion to dry skin on legs Topical Primary Dressing KerraCel Ag Gelling Fiber Dressing, 4x5 in (silver alginate) Discharge Instruction: Apply silver alginate to wound bed as instructed Secondary Dressing ABD Pad, 8x10 Discharge Instruction: Apply over primary dressing as directed. Zetuvit Plus 4x4 in Discharge Instruction: or equivalent extra absorbent pad.Apply over primary dressing as needed. Secured With Compression Wrap ThreePress (3 layer compression wrap) Discharge Instruction: Apply three layer compression as directed. Pad bend of ankle with foam or ABD pad. Compression Stockings Add-Ons Electronic Signature(s) Signed: 02/20/2021 5:29:03 PM By: Deon Pilling Signed: 02/21/2021 11:10:38 AM By: Sandre Kitty Entered By: Sandre Kitty on 02/20/2021 15:20:06 -------------------------------------------------------------------------------- Wound Assessment Details Patient Name: Date of Service: [name] 02/20/2021 2:45 PM Medical Record Number: [id number] Patient Account Number: [id number] Date of Birth/Sex: Treating RN: [birth year] (70 y.o. [name], Meta.Reding Primary Care Elgie Maziarz: [name] Other Clinician: Referring Carlotta Telfair: Treating Devell Parkerson/Extender: Sallye Ober, Whitney Weeks  in Treatment: 185 Wound Status Wound Number: 67 Primary Diabetic Wound/Ulcer of the Lower Extremity Etiology: Wound Location: Right, Medial Lower Leg Wound Open Wounding Event: Gradually Appeared Status: Date Acquired: 05/02/2020 Comorbid Asthma, Hypertension, Peripheral Arterial Disease, Peripheral Weeks Of Treatment: 42 History: Venous Disease, Type II Diabetes, Gout, Osteoarthritis Clustered Wound: No Photos Wound Measurements Length: (cm) 13 Width: (cm) 17 Depth: (cm) 0.1 Area: (cm) 173.573 Volume: (cm) 17.357 % Reduction in Area: -12175.3% % Reduction in Volume: -12209.9% Epithelialization: None Tunneling: No Undermining: No Wound Description Classification: Grade 1 Wound Margin: Indistinct, nonvisible Exudate Amount: Large Exudate Type: Serosanguineous Exudate Color: red, brown Foul Odor After Cleansing: No Slough/Fibrino Yes Wound Bed Granulation Amount: Small (1-33%) Exposed Structure Granulation Quality: Pink, Pale Fascia Exposed: No Necrotic Amount: Large (67-100%) Fat Layer (Subcutaneous Tissue) Exposed: Yes Necrotic Quality: Adherent Slough Tendon Exposed: No Muscle Exposed: No Joint Exposed: No Bone Exposed: No Treatment Notes Wound #67 (Lower Leg) Wound Laterality: Right, Medial Cleanser Peri-Wound Care Sween Lotion (Moisturizing lotion) Discharge Instruction: Apply moisturizing lotion to dry skin on legs Topical Primary Dressing KerraCel Ag Gelling Fiber Dressing, 4x5 in (silver alginate) Discharge Instruction: Apply silver alginate to wound bed as instructed Secondary Dressing ABD Pad, 8x10 Discharge Instruction: Apply over primary dressing as directed. Zetuvit Plus 4x4 in Discharge Instruction: or equivalent extra absorbent pad.Apply over primary dressing as needed. Secured With Compression Wrap ThreePress (3 layer compression wrap) Discharge Instruction: Apply three layer compression as directed. Pad bend of ankle with foam or ABD  pad. Compression Stockings Add-Ons Electronic Signature(s) Signed: 02/20/2021 5:29:03 PM By: Deon Pilling Signed: 02/21/2021 11:10:38 AM By: Sandre Kitty Entered By: Sandre Kitty on 02/20/2021 15:22:55 -------------------------------------------------------------------------------- Wound Assessment Details Patient Name: Date of Service: [name] 02/20/2021 2:45 PM Medical Record Number: [id number] Patient Account Number: [id number] Date of Birth/Sex: Treating RN: 1950-11-15 (70 y.o. [name], [name] Primary Care Charli Halle: [name] Other Clinician: Referring Shaquan Missey: Treating Anai Lipson/Extender: Sallye Ober, Whitney Weeks in Treatment: 185 Wound Status Wound Number: 71 Primary Lymphedema Etiology: Wound Location: Right, Posterior Lower Leg Wound Open Wounding Event: Gradually Appeared Status: Date Acquired: 01/23/2021 Comorbid Asthma, Hypertension, Peripheral Arterial Disease, Peripheral Weeks Of Treatment: 4 History: Venous Disease, Type II Diabetes, Gout, Osteoarthritis Clustered Wound: No Photos Wound Measurements Length: (cm) 6.6 Width: (cm) 3.2 Depth: (cm) 0.1 Area: (cm) 16.588 Volume: (cm) 1.659 % Reduction in Area: -0.6% % Reduction in Volume: -0.6% Epithelialization: None Tunneling: No Undermining: No Wound Description Classification: Full Thickness Without Exposed Support Structures Wound Margin: Distinct,  outline attached Exudate Amount: Large Exudate Type: Serosanguineous Exudate Color: red, brown Foul Odor After Cleansing: No Slough/Fibrino Yes Wound Bed Granulation Amount: None Present (0%) Exposed Structure Necrotic Amount: Large (67-100%) Fascia Exposed: No Necrotic Quality: Adherent Slough Fat Layer (Subcutaneous Tissue) Exposed: Yes Tendon Exposed: No Muscle Exposed: No Joint Exposed: No Bone Exposed: No Treatment Notes Wound #71 (Lower Leg) Wound Laterality: Right, Posterior Cleanser Peri-Wound Care Sween  Lotion (Moisturizing lotion) Discharge Instruction: Apply moisturizing lotion to dry skin on legs Topical Primary Dressing KerraCel Ag Gelling Fiber Dressing, 4x5 in (silver alginate) Discharge Instruction: Apply silver alginate to wound bed as instructed Secondary Dressing ABD Pad, 8x10 Discharge Instruction: Apply over primary dressing as directed. Zetuvit Plus 4x4 in Discharge Instruction: or equivalent extra absorbent pad.Apply over primary dressing as needed. Secured With Compression Wrap ThreePress (3 layer compression wrap) Discharge Instruction: Apply three layer compression as directed. Pad bend of ankle with foam or ABD pad. Compression Stockings Add-Ons Electronic Signature(s) Signed: 02/20/2021 5:29:03 PM By: Deon Pilling Signed: 02/21/2021 11:10:38 AM By: Sandre Kitty Entered By: Sandre Kitty on 02/20/2021 15:23:39 -------------------------------------------------------------------------------- Wound Assessment Details Patient Name: Date of Service: [name] 02/20/2021 2:45 PM Medical Record Number: [id number] Patient Account Number: [id number] Date of Birth/Sex: Treating RN: Jul 14, 1950 (70 y.o. [name], [name] Primary Care Shayanna Thatch: [name] Other Clinician: Referring Khloee Garza: Treating Rashell Shambaugh/Extender: Sallye Ober, Whitney Weeks in Treatment: 185 Wound Status Wound Number: 72 Primary Lymphedema Etiology: Wound Location: Right, Dorsal Foot Secondary Diabetic Wound/Ulcer of the Lower Extremity Wounding Event: Gradually Appeared Etiology: Date Acquired: 02/20/2021 Wound Open Weeks Of Treatment: 0 Status: Clustered Wound: No Comorbid Asthma, Hypertension, Peripheral Arterial Disease, Peripheral History: Venous Disease, Type II Diabetes, Gout, Osteoarthritis Photos Wound Measurements Length: (cm) 1.8 Width: (cm) 2 Depth: (cm) 0.1 Area: (cm) 2.827 Volume: (cm) 0.283 % Reduction in Area: 0% % Reduction in Volume:  0% Epithelialization: None Tunneling: No Undermining: No Wound Description Classification: Full Thickness Without Exposed Support Structures Wound Margin: Distinct, outline attached Exudate Amount: Large Exudate Type: Serosanguineous Exudate Color: red, brown Foul Odor After Cleansing: No Slough/Fibrino Yes Wound Bed Granulation Amount: Small (1-33%) Exposed Structure Granulation Quality: Red Fascia Exposed: No Necrotic Amount: Large (67-100%) Fat Layer (Subcutaneous Tissue) Exposed: Yes Necrotic Quality: Adherent Slough Tendon Exposed: No Muscle Exposed: No Joint Exposed: No Bone Exposed: No Treatment Notes Wound #72 (Foot) Wound Laterality: Dorsal, Right Cleanser Peri-Wound Care Sween Lotion (Moisturizing lotion) Discharge Instruction: Apply moisturizing lotion to dry skin on legs Topical Primary Dressing KerraCel Ag Gelling Fiber Dressing, 4x5 in (silver alginate) Discharge Instruction: Apply silver alginate to wound bed as instructed Secondary Dressing ABD Pad, 8x10 Discharge Instruction: Apply over primary dressing as directed. Zetuvit Plus 4x4 in Discharge Instruction: or equivalent extra absorbent pad.Apply over primary dressing as needed. Secured With Compression Wrap ThreePress (3 layer compression wrap) Discharge Instruction: Apply three layer compression as directed. Pad bend of ankle with foam or ABD pad. Compression Stockings Add-Ons Electronic Signature(s) Signed: 02/20/2021 5:29:03 PM By: Deon Pilling Signed: 02/21/2021 11:10:38 AM By: Sandre Kitty Entered By: Sandre Kitty on 02/20/2021 15:22:02 -------------------------------------------------------------------------------- Wound Assessment Details Patient Name: Date of Service: [name] 02/20/2021 2:45 PM Medical Record Number: [id number] Patient Account Number: [id number] Date of Birth/Sex: Treating RN: 06-20-1950 (70 y.o. [name] Primary Care Deniyah Dillavou: [name] Other Clinician: Referring Arzu Mcgaughey: Treating Addie Alonge/Extender: Sallye Ober, Whitney Weeks in Treatment: 185 Wound Status Wound Number: 73 Primary Lymphedema Etiology: Wound Location: Right, Lateral Lower Leg Secondary  Diabetic Wound/Ulcer of the Lower Extremity Wounding Event: Gradually Appeared Etiology: Date Acquired: 02/20/2021 Wound Open Weeks Of Treatment: 0 Status: Clustered Wound: No Comorbid Asthma, Hypertension, Peripheral Arterial Disease, Peripheral History: Venous Disease, Type II Diabetes, Gout, Osteoarthritis Photos Wound Measurements Length: (cm) 3.5 Width: (cm) 2 Depth: (cm) 0.1 Area: (cm) 5.498 Volume: (cm) 0.55 % Reduction in Area: 0% % Reduction in Volume: 0% Epithelialization: Small (1-33%) Tunneling: No Undermining: No Wound Description Classification: Full Thickness Without Exposed Support Structures Wound Margin: Distinct, outline attached Exudate Amount: Large Exudate Type: Serosanguineous Exudate Color: red, brown Foul Odor After Cleansing: No Slough/Fibrino Yes Wound Bed Granulation Amount: Small (1-33%) Exposed Structure Granulation Quality: Pink, Pale Fascia Exposed: No Necrotic Amount: Large (67-100%) Fat Layer (Subcutaneous Tissue) Exposed: Yes Necrotic Quality: Adherent Slough Tendon Exposed: No Muscle Exposed: No Joint Exposed: No Bone Exposed: No Treatment Notes Wound #73 (Lower Leg) Wound Laterality: Right, Lateral Cleanser Peri-Wound Care Sween Lotion (Moisturizing lotion) Discharge Instruction: Apply moisturizing lotion to dry skin on legs Topical Primary Dressing KerraCel Ag Gelling Fiber Dressing, 4x5 in (silver alginate) Discharge Instruction: Apply silver alginate to wound bed as instructed Secondary Dressing ABD Pad, 8x10 Discharge Instruction: Apply over primary dressing as directed. Zetuvit Plus 4x4 in Discharge Instruction: or equivalent extra absorbent pad.Apply over primary dressing as  needed. Secured With Compression Wrap ThreePress (3 layer compression wrap) Discharge Instruction: Apply three layer compression as directed. Pad bend of ankle with foam or ABD pad. Compression Stockings Add-Ons Electronic Signature(s) Signed: 02/20/2021 5:29:03 PM By: Deon Pilling Signed: 02/21/2021 11:10:38 AM By: Sandre Kitty Entered By: Sandre Kitty on 02/20/2021 15:24:42 -------------------------------------------------------------------------------- Vitals Details Patient Name: Date of Service: [name] 02/20/2021 2:45 PM Medical Record Number: [id number] Patient Account Number: [id number] Date of Birth/Sex: Treating RN: 11-Nov-1950 (70 y.o. [name], [name] Primary Care Roan Sawchuk: [name] Other Clinician: Referring Kendi Defalco: Treating Jeniyah Menor/Extender: Sallye Ober, Whitney Weeks in Treatment: 185 Vital Signs Time Taken: 15:12 Temperature (F): 99.1 Height (in): 62 Pulse (bpm): 87 Weight (lbs): 335 Respiratory Rate (breaths/min): 20 Body Mass Index (BMI): 61.3 Blood Pressure (mmHg): 195/76 Capillary Blood Glucose (mg/dl): 242 Reference Range: 80 - 120 mg / dl Electronic Signature(s) Signed: 02/20/2021 5:29:03 PM By: Deon Pilling Entered By: Deon Pilling on 02/20/2021 15:22:59

## 2021-03-04 ENCOUNTER — Ambulatory Visit: Payer: Medicare PPO | Admitting: Infectious Diseases

## 2021-03-06 ENCOUNTER — Ambulatory Visit: Payer: Medicare PPO | Admitting: Infectious Diseases

## 2021-03-06 ENCOUNTER — Encounter: Payer: Self-pay | Admitting: Infectious Diseases

## 2021-03-06 ENCOUNTER — Other Ambulatory Visit: Payer: Self-pay

## 2021-03-06 ENCOUNTER — Encounter (HOSPITAL_BASED_OUTPATIENT_CLINIC_OR_DEPARTMENT_OTHER): Payer: Medicare PPO | Admitting: Physician Assistant

## 2021-03-06 VITALS — BP 119/71 | HR 78 | Temp 98.3°F

## 2021-03-06 DIAGNOSIS — I872 Venous insufficiency (chronic) (peripheral): Secondary | ICD-10-CM | POA: Diagnosis not present

## 2021-03-06 DIAGNOSIS — L03119 Cellulitis of unspecified part of limb: Secondary | ICD-10-CM | POA: Diagnosis not present

## 2021-03-06 NOTE — Progress Notes (Signed)
[facility]                                                             [place], [place], [place], [address]                                                                  Phn. [phone]; Fax: [phone]                                                                             Date: 03/06/21  Reason for Referral: cellulitis  Requesting  Provider: Jeri Cos   Assessment # Bilateral Chronic Lower Extremity swelling/lymphedema in the setting of venous insufficiency/venous reflux complicated with venous ulcers - Recently completed 2 weeks course of Augmentin few days ago. Wound cx 8/24 Klebsiella pneumoniae and diptheroids.   # DM # Obesity   Plan -The wound in her lower extremities seems to be chronic and do not have active symptoms or signs of infection. I discussed with her that antibiotics will not cure her current condition and she will need to follow up with wound care and follow their recommendations as instructed for wound care. I also discussed with her to keep her legs elevated when sleeping. She tells me she often forgets to take her lasix as well. She has not been consistently using compression stockings and lymphedema pumps as instructed by Wound care on review of their notes/per patient.   -She agreed to see a Vascular surgery as well given venous reflux noted in Vascular US in 08/25/2019  -Will get baseline lab and Xrays of bilateral feet  -No indication of abtx currently -I will follow up in a week  -Follow up with wound care   All questions and concerns were discussed and addressed. Patient verbalized understanding of the plan. ____________________________________________________________________________________________________________________  HPI: 70 Year old female with PMH of Asthma, HTN, HLD, DM, Gout, Obesity who is referred from wound care center for concern  of cellulitis. Patient tells me she has had wound in her lower extremities for approximately 10 years. She has been following with wound care center and she tells me the wound will heal in between however, it has gotten worse in the last 1 year. I have reviewed notes from the wound care center where they have mentioned patient being non compliant to their instructions like keeping legs elevated and taking lasix. She tells me she has seen Vascular surgery long time ago and has not seen them recently. She had a venous study done in 08/24/20 where there was evidence of reflux in the proximal calf of the great saphenous vein and small saphenous vein reflux. She tells me she recently completed a 2 weeks course of Augmentin few days ago and is  not on any abtx currently. She denies fevers, chills, sweats. Denies nausea, vomiting, abdominal pain and diarrhea or any systemic signs of infection. She had a Xray of bilateral foot done on 02/05/21 with no bony changes to suugest osteomyelitis . MRI left foot 02/06/21 with findings s/o cellulitis and no osteomyelitis. Patient was recently hospitalised 8/23-8/26 where patient was treated for presumed cellulitis after failed course of cephalexin OP. She was started on Vancomycin/zosyn >>Doxycycline and augmentin   ROS: Constitutional: Negative for fever, chills, activity change, appetite change, fatigue and unexpected weight change.  Respiratory: Negative for cough, shortness of breath Cardiovascular: Negative for chest pain, palpitations and leg swelling.  Gastrointestinal: Negative for nausea, vomiting, abdominal pain, diarrhea/constipation, .  Genitourinary: Negative for dysuria, hematuria, flank pain Musculoskeletal: Negative for myalgias, arthralgia, back pain, joint swelling, arthralgias Skin: Negative for rashes, lesions  Neurological: Negative for weakness, dizziness or headache  Past Medical History:  Diagnosis Date   Anxiety    Arthritis    "back, arms, legs"  (03/24/2016)   Asthma    Chronic lower back pain    Colonic polyp    last colonoscopy done in 2009 with normal results per medical record   Depressive disorder    Gastric polyp    Gout    has taken allopurinol 357m once daily in past   Headache    History of hiatal hernia    Hypertension    Migraine    "none in awhile; might have a couple/year" (03/24/2016)   Mixed hyperlipidemia    01/2016 Total chol 141, HDL 59, LDL 63, ration 1.1   Osteoarthritis    TIA (transient ischemic attack) 11/2014   [place] II diabetes mellitus [place]    Past Surgical History:  Procedure Laterality Date   ARTERY BIOPSY Right 08/12/2016   Procedure: BIOPSY TEMPORAL ARTERY;  Surgeon: CElam [name] MD;  [facility] [facility]  Service: Vascular;  Laterality: Right;  BIOPSY TEMPORAL ARTERY   BREAST BIOPSY Left ~ 2015   benign   CARPAL TUNNEL RELEASE Bilateral    DILATION AND CURETTAGE OF UTERUS     KNEE ARTHROSCOPY Right 2003   [place]  Current Outpatient Medications on File Prior to Visit  Medication Sig Dispense Refill   ACCU-CHEK FASTCLIX LANCETS [place]1 each. Check blood sugar once daily     ACCU-CHEK SMARTVIEW test strip 1 each 4 (four) times daily. for testing  0   acetaminophen (TYLENOL) 500 MG tablet Take 500 mg by mouth daily as needed for moderate pain.     albuterol (PROVENTIL HFA;VENTOLIN HFA) 108 (90 Base) MCG/ACT inhaler Inhale 2 puffs into the lungs every 4 (four) hours as needed for wheezing or shortness of breath.      allopurinol (ZYLOPRIM) 300 MG tablet Take 300 mg by mouth daily.     aspirin 81 MG EC tablet Take 1 tablet (81 mg total) by mouth daily. 30 tablet 0   atenolol (TENORMIN) 25 MG tablet Take 25-50 mg by mouth See admin instructions. Take 50 mg every morning and then 25 mg every evening     Blood Glucose Monitoring Suppl (ACCU-CHEK NANO SMARTVIEW) w/Device KIT 1 each See admin instructions.  0    furosemide (LASIX) 20 MG tablet Take 1 tablet (20 mg total) by mouth daily. (Patient taking differently: Take 40 mg by mouth daily.) 30 tablet 0   gabapentin (NEURONTIN) 300  MG capsule Take 2 capsules (600 mg total) by mouth 2 (two) times daily.     hydrocerin (EUCERIN) CREA Apply 1 application topically daily. (Patient taking differently: Apply 1 application topically daily as needed (dry skin).) 228 g 0   HYDROcodone-acetaminophen (NORCO) 7.5-325 MG tablet Take 1 tablet by mouth every 6 (six) hours as needed for moderate pain.     insulin glargine (LANTUS) 100 UNIT/ML Solostar Pen Inject 35 Units into the skin every morning. 45 mL 3   Insulin Pen Needle (PENTIPS) 32G X 4 MM MISC 1 each by Does not apply route daily. 30 each 0   NOVOFINE 32G X 6 MM MISC 1 each as needed.     polyethylene glycol (MIRALAX / GLYCOLAX) packet Take 17 g by mouth daily as needed for moderate constipation.     potassium chloride (K-DUR) 10 MEQ tablet Take 1 tablet (10 mEq total) by mouth daily. 30 tablet 0   protein supplement shake (PREMIER PROTEIN) LIQD Take 325 mLs (11 oz total) by mouth daily.  0   traZODone (DESYREL) 100 MG tablet Take 1 tablet (100 mg total) by mouth at bedtime. 30 tablet 0   No current facility-administered medications on file prior to visit.   Allergies  Allergen Reactions   Ace Inhibitors Swelling   Tizanidine    Metformin And Related Other (See Comments)    CHILLS   Propofol Itching   Social History   Socioeconomic History   Marital status: Married    Spouse name: Not on file   Number of children: Not on file   Years of education: 12+   Highest education level: Not on file  Occupational History   Not on file  Tobacco Use   Smoking status: Never   Smokeless tobacco: Never  Vaping Use   Vaping Use: Never used  Substance and Sexual Activity   Alcohol use: No   Drug use: No   Sexual activity: Never  Other Topics Concern   Not on file  Social History Narrative   Right  handed   Caffeine use: none   Lives with husband   Recently moved from [place] Strain: Not on file  Food Insecurity: Not on file  Transportation Needs: Not on file  Physical Activity: Not on file  Stress: Not on file  Social Connections: Not on file  Intimate Partner Violence: Not on file   Family History  Problem Relation Age of Onset   Stroke Father    Stroke Brother    Cancer Other    Gout Mother    Hypertension Mother    Arthritis Mother    Asthma Mother    Stroke Brother      Vitals BP 119/71   Pulse 78   Temp 98.3 F (36.8 C) (Oral)   SpO2 96% '   Examination  General - not in acute distress, comfortably sitting in chair, OBESE  HEENT - PEERLA, no pallor and no icterus Chest - b/l clear air entry, no additional sounds CVS- Normal s1s2, RRR Abdomen - Soft, Non tender , non distended, OBESE ABDOMEN Ext-      Neuro: grossly normal Back - WNL Psych : calm and cooperative  Recent labs CBC Latest Ref Rng & Units 02/08/2021 02/07/2021 02/06/2021  WBC 4.0 - 10.5 K/uL 8.4 7.9 8.1  Hemoglobin 12.0 - 15.0 g/dL 11.6(L) 11.2(L) 12.2  Hematocrit 36.0 - 46.0 % 35.7(L) 35.1(L) 39.5  Platelets 150 -  400 K/uL 288 301 303   CMP Latest Ref Rng & Units 02/06/2021 02/05/2021 11/18/2019  Glucose 70 - 99 mg/dL 290(H) 257(H) 215(H)  BUN 8 - 23 mg/dL 6(L) 8 15  Creatinine 0.44 - 1.00 mg/dL 0.88 0.80 0.80  Sodium 135 - 145 mmol/L 136 134(L) 138  Potassium 3.5 - 5.1 mmol/L 4.5 3.7 4.1  Chloride 98 - 111 mmol/L 103 102 102  CO2 22 - 32 mmol/L 24 25 25   Calcium 8.9 - 10.3 mg/dL 9.4 9.3 9.2  Total Protein 6.5 - 8.1 g/dL 8.0 8.6(H) 8.7(H)  Total Bilirubin 0.3 - 1.2 mg/dL 0.6 0.4 0.5  Alkaline Phos 38 - 126 U/L 90 103 110  AST 15 - 41 U/L 13(L) 14(L) 14(L)  ALT 0 - 44 U/L 11 13 15      Pertinent Microbiology Results for orders placed or performed during the hospital encounter of 02/05/21  Culture, blood (routine x  2)     Status: None   Collection Time: 02/05/21  3:03 PM   Specimen: BLOOD LEFT ARM  Result Value Ref Range Status   Specimen Description BLOOD LEFT ARM  Final   Special Requests   Final    BOTTLES DRAWN AEROBIC AND ANAEROBIC Blood Culture results may not be optimal due to an inadequate volume of blood received in culture bottles   Culture   Final    NO GROWTH 5 DAYS Performed at [facility], [place] [address], [place], [place] [address]    Report Status 02/10/2021 FINAL  Final  Culture, blood (routine x 2)     Status: None   Collection Time: 02/05/21  6:30 PM   Specimen: BLOOD  Result Value Ref Range Status   Specimen Description BLOOD RIGHT ANTECUBITAL  Final   Special Requests   Final    BOTTLES DRAWN AEROBIC AND ANAEROBIC Blood Culture results may not be optimal due to an inadequate volume of blood received in culture bottles   Culture   Final    NO GROWTH 5 DAYS Performed at [facility], [place] [address], [place], [place] [address]    Report Status 02/10/2021 FINAL  Final  Resp Panel by RT-PCR (Flu A&B, Covid) Nasopharyngeal Swab     Status: None   Collection Time: 02/05/21  6:37 PM   Specimen: Nasopharyngeal Swab; Nasopharyngeal(NP) swabs in vial transport medium  Result Value Ref Range Status   SARS Coronavirus 2 by RT PCR NEGATIVE NEGATIVE Final    Comment: (NOTE) SARS-CoV-2 target nucleic acids are NOT DETECTED.  The SARS-CoV-2 RNA is generally detectable in upper respiratory specimens during the acute phase of infection. The lowest concentration of SARS-CoV-2 viral copies this assay can detect is 138 copies/mL. A negative result does not preclude SARS-Cov-2 infection and should not be used as the sole basis for treatment or other patient management decisions. A negative result may occur with  improper specimen collection/handling, submission of specimen other than nasopharyngeal swab, presence of viral mutation(s) within the areas targeted by this assay, and  inadequate number of viral copies(<138 copies/mL). A negative result must be combined with clinical observations, patient history, and epidemiological information. The expected result is Negative.  Fact Sheet for Patients:  EntrepreneurPulse.com.au  Fact Sheet for Healthcare Providers:  IncredibleEmployment.be  This test is no t yet approved or cleared by the [place] FDA and  has been authorized for detection and/or diagnosis of SARS-CoV-2 by FDA under an Emergency Use Authorization (EUA). This EUA will remain  in  effect (meaning this test can be used) for the duration of the COVID-19 declaration under Section 564(b)(1) of the Act, 21 U.S.C.section 360bbb-3(b)(1), unless the authorization is terminated  or revoked sooner.       Influenza A by PCR NEGATIVE NEGATIVE Final   Influenza B by PCR NEGATIVE NEGATIVE Final    Comment: (NOTE) The Xpert Xpress SARS-CoV-2/FLU/RSV plus assay is intended as an aid in the diagnosis of influenza from Nasopharyngeal swab specimens and should not be used as a sole basis for treatment. Nasal washings and aspirates are unacceptable for Xpert Xpress SARS-CoV-2/FLU/RSV testing.  Fact Sheet for Patients: EntrepreneurPulse.com.au  Fact Sheet for Healthcare Providers: IncredibleEmployment.be  This test is not yet approved or cleared by the [place] FDA and has been authorized for detection and/or diagnosis of SARS-CoV-2 by FDA under an Emergency Use Authorization (EUA). This EUA will remain in effect (meaning this test can be used) for the duration of the COVID-19 declaration under Section 564(b)(1) of the Act, 21 U.S.C. section 360bbb-3(b)(1), unless the authorization is terminated or revoked.  Performed at [facility], [place] [address], [place], [place] [address]   Aerobic Culture w Gram Stain (superficial specimen)     Status: None   Collection Time:  02/06/21  3:01 AM   Specimen: Wound  Result Value Ref Range Status   Specimen Description WOUND RIGHT LEG  Final   Special Requests RIGHT LEG  Final   Gram Stain   Final    RARE SQUAMOUS EPITHELIAL CELLS PRESENT FEW WBC SEEN MODERATE GRAM POSITIVE COCCI FEW GRAM NEGATIVE RODS FEW GRAM VARIABLE ROD    Culture   Final    MODERATE KLEBSIELLA PNEUMONIAE ABUNDANT DIPHTHEROIDS(CORYNEBACTERIUM SPECIES) Standardized susceptibility testing for this organism is not available.    Report Status 02/12/2021 FINAL  Final   Organism ID, Bacteria KLEBSIELLA PNEUMONIAE  Final      Susceptibility   Klebsiella pneumoniae - MIC*    AMPICILLIN >=32 RESISTANT Resistant     CEFAZOLIN <=4 SENSITIVE Sensitive     CEFEPIME <=0.12 SENSITIVE Sensitive     CEFTAZIDIME <=1 SENSITIVE Sensitive     CEFTRIAXONE <=0.25 SENSITIVE Sensitive     CIPROFLOXACIN <=0.25 SENSITIVE Sensitive     GENTAMICIN <=1 SENSITIVE Sensitive     IMIPENEM <=0.25 SENSITIVE Sensitive     TRIMETH/SULFA <=20 SENSITIVE Sensitive     AMPICILLIN/SULBACTAM 4 SENSITIVE Sensitive     PIP/TAZO <=4 SENSITIVE Sensitive     * MODERATE KLEBSIELLA PNEUMONIAE  Aerobic Culture w Gram Stain (superficial specimen)     Status: Abnormal   Collection Time: 02/06/21  3:01 AM   Specimen: Wound  Result Value Ref Range Status   Specimen Description WOUND LEG LEFT  Final   Special Requests LEG LEFT  Final   Gram Stain   Final    NO SQUAMOUS EPITHELIAL CELLS SEEN FEW WBC SEEN MODERATE GRAM POSITIVE COCCI MODERATE GRAM NEGATIVE RODS FEW GRAM POSITIVE RODS    Culture (A)  Final    MULTIPLE ORGANISMS PRESENT, NONE PREDOMINANT NO GROUP A STREP (S.PYOGENES) ISOLATED NO STAPHYLOCOCCUS AUREUS ISOLATED Performed at [facility], [address] [place], [place] [address]    Report Status 02/11/2021 FINAL  Final     Pertinent Imaging MRI Left foot 02/06/21 IMPRESSION: 1. Nonspecific soft tissue swelling and skin thickening along the dorsal  aspect of the foot and around the ankle extending into the lower leg which may be reactive versus secondary to cellulitis. 2. No osteomyelitis  of the left foot.  Left Foot Xray 02/05/21 IMPRESSION: 1. No radiographic evidence of osseous destruction in the foot, no definite soft tissue gas. 2. Diffuse soft tissue swelling about the foot. 3. MRI may be considered for increased sensitivity for detection of osteomyelitis.  Rt foot xray 02/05/21 IMPRESSION: 1. Suggestion of ulceration about the heel. No sign of frank bony destruction or acute bony abnormality. 2. Soft tissue swelling in a generalized fashion throughout the foot, perhaps more pronounced about the forefoot on the lateral view. correlate with any signs of soft tissue infection or frank ulceration  All pertinent labs/Imagings/notes reviewed. All pertinent plain films and CT images have been personally visualized and interpreted; radiology reports have been reviewed. Decision making incorporated into the Impression / Recommendations.  I have spent a total of 60 minutes of face-to-face and non-face-to-face time, excluding clinical staff time, preparing to see patient, ordering tests and/or medications, and provide counseling the patient    Electronically signed by:  Rosiland Oz, MD Infectious Disease Physician [facility] [address] [place], [place] [address] Phone: [phone]  Fax: [phone]

## 2021-03-07 LAB — CBC WITH DIFFERENTIAL/PLATELET
Absolute Monocytes: 497 cells/uL (ref 200–950)
Basophils Absolute: 29 cells/uL (ref 0–200)
Basophils Relative: 0.4 %
Eosinophils Absolute: 310 cells/uL (ref 15–500)
Eosinophils Relative: 4.3 %
HCT: 38.6 % (ref 35.0–45.0)
Hemoglobin: 12.3 g/dL (ref 11.7–15.5)
Lymphs Abs: 1728 cells/uL (ref 850–3900)
MCH: 28.1 pg (ref 27.0–33.0)
MCHC: 31.9 g/dL — ABNORMAL LOW (ref 32.0–36.0)
MCV: 88.1 fL (ref 80.0–100.0)
MPV: 9.2 fL (ref 7.5–12.5)
Monocytes Relative: 6.9 %
Neutro Abs: 4637 cells/uL (ref 1500–7800)
Neutrophils Relative %: 64.4 %
Platelets: 353 10*3/uL (ref 140–400)
RBC: 4.38 10*6/uL (ref 3.80–5.10)
RDW: 15.2 % — ABNORMAL HIGH (ref 11.0–15.0)
Total Lymphocyte: 24 %
WBC: 7.2 10*3/uL (ref 3.8–10.8)

## 2021-03-07 LAB — C-REACTIVE PROTEIN: CRP: 43.7 mg/L — ABNORMAL HIGH (ref <8.0)

## 2021-03-07 LAB — SEDIMENTATION RATE: Sed Rate: 117 mm/h — ABNORMAL HIGH (ref 0–30)

## 2021-03-13 ENCOUNTER — Encounter (HOSPITAL_BASED_OUTPATIENT_CLINIC_OR_DEPARTMENT_OTHER): Payer: Medicare PPO | Admitting: Physician Assistant

## 2021-03-13 ENCOUNTER — Other Ambulatory Visit: Payer: Self-pay

## 2021-03-13 DIAGNOSIS — E11621 Type 2 diabetes mellitus with foot ulcer: Secondary | ICD-10-CM | POA: Diagnosis not present

## 2021-03-13 NOTE — Progress Notes (Signed)
[name], [name] ([id number]) Visit Report for 03/13/2021 Arrival Information Details Patient Name: Date of Service: [name], [name] 03/13/2021 1:00 PM Medical Record Number: [id number] Patient Account Number: [id number] Date of Birth/Sex: Treating RN: [birth year] (70 y.o. [name] Primary Care [name]: [name] Other Clinician: Referring [name]: Treating [name]/Extender: [name], [name] in Treatment: 188 Visit Information History Since Last Visit Added or deleted any medications: No Patient Arrived: Wheel Chair Any new allergies or adverse reactions: No Arrival Time: 13:18 Had a fall or experienced change in No Transfer Assistance: Manual activities of daily living that may affect Patient Identification Verified: Yes risk of falls: Secondary Verification Process Completed: Yes Signs or symptoms of abuse/neglect since last visito No Patient Requires Transmission-Based Precautions: No Hospitalized since last visit: No Patient Has Alerts: Yes Implantable device outside of the clinic excluding No Patient Alerts: R ABI= 1.01 cellular tissue based products placed in the center L ABI = .99 since last visit: Has Dressing in Place as Prescribed: Yes Has Compression in Place as Prescribed: Yes Pain Present Now: No Electronic Signature(s) Signed: 03/13/2021 5:34:22 PM By: [name] Entered By: [name] on 03/13/2021 13:23:45 -------------------------------------------------------------------------------- Compression Therapy Details Patient Name: Date of Service: [name]. 03/13/2021 1:00 PM Medical Record Number: [id number] Patient Account Number: [id number] Date of Birth/Sex: Treating RN: 05/28/1951 (70 y.o. [name] Primary Care [name]: [name] Other Clinician: Referring [name]: Treating [name]/Extender: [name] [name] in Treatment: 188 Compression Therapy Performed for Wound  Assessment: Wound #61 Left,Circumferential Lower Leg Performed By: Clinician [name] Jackson, RN Compression Type: Three Layer Post Procedure Diagnosis Same as Pre-procedure Electronic Signature(s) Signed: 03/13/2021 5:34:22 PM By: [name] Entered By: [name] on 03/13/2021 14:07:22 -------------------------------------------------------------------------------- Compression Therapy Details Patient Name: Date of Service: [name]. 03/13/2021 1:00 PM Medical Record Number: [id number] Patient Account Number: [id number] Date of Birth/Sex: Treating RN: Apr 01, 1951 (70 y.o. [name] Primary Care [name]: [name] Other Clinician: Referring [name]: Treating [name]/Extender: [name] [name] in Treatment: 188 Compression Therapy Performed for Wound Assessment: Wound #64 Left,Dorsal Foot Performed By: Clinician [name] Jackson, RN Compression Type: Three Layer Post Procedure Diagnosis Same as Pre-procedure Electronic Signature(s) Signed: 03/13/2021 5:34:22 PM By: [name] Entered By: [name] on 03/13/2021 14:07:22 -------------------------------------------------------------------------------- Compression Therapy Details Patient Name: Date of Service: [name]. 03/13/2021 1:00 PM Medical Record Number: [id number] Patient Account Number: [id number] Date of Birth/Sex: Treating RN: May 28, 1951 (70 y.o. [name] Primary Care [name]: [name] Other Clinician: Referring [name]: Treating Tasnia [name]/Extender: [name] [name] in Treatment: 188 Compression Therapy Performed for Wound Assessment: Wound #67 Right,Medial Lower Leg Performed By: Clinician [name] Jackson, RN Compression Type: Three Layer Post Procedure Diagnosis Same as Pre-procedure Electronic Signature(s) Signed: 03/13/2021 5:34:22 PM By: [name] Entered By: [name] on 03/13/2021  14:07:22 -------------------------------------------------------------------------------- Compression Therapy Details Patient Name: Date of Service: [name]. 03/13/2021 1:00 PM Medical Record Number: [id number] Patient Account Number: [id number] Date of Birth/Sex: Treating RN: 01/08/1951 (70 y.o. [name] Primary Care [name]: [name] Other Clinician: Referring [name]: Treating [name]/Extender: [name] [name] in Treatment: 188 Compression Therapy Performed for Wound Assessment: Wound #71 Right,Posterior Lower Leg Performed By: Clinician [name] Jackson, RN Compression Type: Three Layer Post Procedure Diagnosis Same as Pre-procedure Electronic Signature(s) Signed: 03/13/2021 5:34:22 PM By: [name] Entered By: [name] on 03/13/2021 14:07:22 -------------------------------------------------------------------------------- Compression Therapy Details Patient Name: Date of Service:  [name], [name] 03/13/2021 1:00 PM Medical Record Number: [id number] Patient Account Number: [id number] Date of Birth/Sex: Treating RN: 07/30/1950 (70 y.o. [name] Primary Care [name]: [name] Other Clinician: Referring [name]: Treating [name]/Extender: [name] [name] in Treatment: 188 Compression Therapy Performed for Wound Assessment: Wound #72 Right,Dorsal Foot Performed By: Clinician [name] Jackson, RN Compression Type: Three Layer Post Procedure Diagnosis Same as Pre-procedure Electronic Signature(s) Signed: 03/13/2021 5:34:22 PM By: [name] Entered By: [name] on 03/13/2021 14:07:23 -------------------------------------------------------------------------------- Encounter Discharge Information Details Patient Name: Date of Service: [name]. 03/13/2021 1:00 PM Medical Record Number: [id number] Patient Account Number: [id number] Date of Birth/Sex: Treating  RN: 03/02/1951 (70 y.o. [name] Primary Care [name]: [name] Other Clinician: Referring [name]: Treating [name]/Extender: [name], [name] in Treatment: [phone] Encounter Discharge Information Items Discharge Condition: Stable Ambulatory Status: Wheelchair Discharge Destination: Home Transportation: Private Auto Schedule Follow-up Appointment: Yes Clinical Summary of Care: Provided on 03/13/2021 Form Type Recipient Paper Patient Patient Electronic Signature(s) Signed: 03/13/2021 5:34:22 PM By: [name] Entered By: [name] on 03/13/2021 14:45:03 -------------------------------------------------------------------------------- Lower Extremity Assessment Details Patient Name: Date of Service: [name]. 03/13/2021 1:00 PM Medical Record Number: [id number] Patient Account Number: [id number] Date of Birth/Sex: Treating RN: 1951-05-20 (70 y.o. [name] Primary Care Vivaan Helseth: [name] Other Clinician: Referring Ardell Makarewicz: Treating Shevonne Wolf/Extender: [name], [name] in Treatment: 188 Edema Assessment Assessed: [Left: Yes] [Right: Yes] Edema: [Left: Yes] [Right: Yes] Calf Left: Right: Point of Measurement: 33 cm From Medial Instep 42 cm 40.9 cm Ankle Left: Right: Point of Measurement: 9 cm From Medial Instep 30 cm 25.5 cm Vascular Assessment Pulses: Dorsalis Pedis Palpable: [Left:Yes] [Right:Yes] Electronic Signature(s) Signed: 03/13/2021 5:34:22 PM By: [name] Entered By: [name] on 03/13/2021 13:40:06 -------------------------------------------------------------------------------- Multi-Disciplinary Care Plan Details Patient Name: Date of Service: [name]. 03/13/2021 1:00 PM Medical Record Number: [id number] Patient Account Number: [id number] Date of Birth/Sex: Treating RN: 1951/02/25 (70 y.o. [name] Primary Care Edyth Glomb: [name] Other  Clinician: Referring Lemond Griffee: Treating Dragan Tamburrino/Extender: [name] [name] in Treatment: Eldridge reviewed with physician Active Inactive Venous Leg Ulcer Nursing Diagnoses: Actual venous Insuffiency (use after diagnosis is confirmed) Knowledge deficit related to disease process and management Goals: Patient will maintain optimal edema control Date Initiated: 08/12/2017 Target Resolution Date: 04/10/2021 Goal Status: Active Patient/caregiver will verbalize understanding of disease process and disease management Date Initiated: 08/12/2017 Date Inactivated: 04/21/2018 Target Resolution Date: 04/24/2018 Goal Status: Met Interventions: Assess peripheral edema status every visit. Compression as ordered Treatment Activities: Therapeutic compression applied : 08/12/2017 Notes: 03/13/21: Edema control ongoing Wound/Skin Impairment Nursing Diagnoses: Impaired tissue integrity Knowledge deficit related to ulceration/compromised skin integrity Goals: Patient/caregiver will verbalize understanding of skin care regimen Date Initiated: 08/12/2017 Target Resolution Date: 04/10/2021 Goal Status: Active Ulcer/skin breakdown will have a volume reduction of 30% by week 4 Date Initiated: 08/05/2017 Date Inactivated: 09/30/2017 Target Resolution Date: 10/03/2017 Goal Status: Met Ulcer/skin breakdown will have a volume reduction of 50% by week 8 Date Initiated: 09/30/2017 Date Inactivated: 10/28/2017 Target Resolution Date: 10/28/2017 Goal Status: Met Interventions: Assess patient/caregiver ability to perform ulcer/skin care regimen upon admission and as needed Assess ulceration(s) every visit Provide education on ulcer and skin care Screen for HBO Treatment Activities: Patient referred to home care : 08/05/2017 Skin care regimen initiated : 08/05/2017 Topical wound management initiated : 08/05/2017 Notes: 03/13/21: Wound care regimen ongoing Electronic  Signature(s) Signed: 03/13/2021 5:34:22 PM By: [name] Entered By: [name] on 03/13/2021 13:55:58 -------------------------------------------------------------------------------- Pain Assessment Details Patient Name: Date of Service: JANEVA, PEASTER 03/13/2021 1:00 PM Medical Record Number: [id number] Patient Account Number: [id number] Date of Birth/Sex: Treating RN: 1951-03-08 (70 y.o. [name] Primary Care Katlin Ciszewski: [name] Other Clinician: Referring Yarielis Funaro: Treating Wetona Viramontes/Extender: [name], Loree Fee [name] in Treatment: [phone] Active Problems Location of Pain Severity and Description of Pain Patient Has Paino No Site Locations Pain Management and Medication Current Pain Management: Electronic Signature(s) Signed: 03/13/2021 5:34:22 PM By: [name] Entered By: [name] on 03/13/2021 13:23:59 -------------------------------------------------------------------------------- Patient/Caregiver Education Details Patient Name: Date of Service: Pendergraft, Shreya M. 9/28/2022andnbsp1:00 PM Medical Record Number: [id number] Patient Account Number: [id number] Date of Birth/Gender: Treating RN: 1950/10/14 (70 y.o. [name] Primary Care Physician: [name] Other Clinician: Referring Physician: Treating Physician/Extender: [name] [name] in Treatment: [phone] Education Assessment Education Provided To: Patient Education Topics Provided Venous: Methods: Explain/Verbal, Printed Responses: State content correctly Wound/Skin Impairment: Methods: Explain/Verbal, Printed Responses: State content correctly Electronic Signature(s) Signed: 03/13/2021 5:34:22 PM By: [name] Entered By: [name] on 03/13/2021 13:56:21 -------------------------------------------------------------------------------- Wound Assessment Details Patient Name: Date of Service: [name]. 03/13/2021 1:00  PM Medical Record Number: [id number] Patient Account Number: [id number] Date of Birth/Sex: Treating RN: Aug 19, 1950 (70 y.o. [name] Primary Care Manoah Deckard: [name] Other Clinician: Referring Etheridge Geil: Treating Nygel Prokop/Extender: [name], [name] in Treatment: 188 Wound Status Wound Number: 61 Primary Venous Leg Ulcer Etiology: Wound Location: Left, Circumferential Lower Leg Wound Open Wounding Event: Gradually Appeared Status: Date Acquired: 04/20/2019 Comorbid Asthma, Hypertension, Peripheral Arterial Disease, Peripheral [name] Of Treatment: 99 History: Venous Disease, Type II Diabetes, Gout, Osteoarthritis Clustered Wound: Yes Photos Wound Measurements Length: (cm) 13 Width: (cm) 30 Depth: (cm) 0.1 Area: (cm) 306.305 Volume: (cm) 30.631 % Reduction in Area: -3962.4% % Reduction in Volume: -3962.5% Epithelialization: None Tunneling: No Undermining: No Wound Description Classification: Full Thickness Without Exposed Support Structures Wound Margin: Distinct, outline attached Exudate Amount: Large Exudate Type: Purulent Exudate Color: yellow, brown, green Foul Odor After Cleansing: No Slough/Fibrino Yes Wound Bed Granulation Amount: Small (1-33%) Exposed Structure Granulation Quality: Red, Pink Fascia Exposed: No Necrotic Amount: Large (67-100%) Fat Layer (Subcutaneous Tissue) Exposed: Yes Necrotic Quality: Adherent Slough Tendon Exposed: No Muscle Exposed: No Joint Exposed: No Bone Exposed: No Treatment Notes Wound #61 (Lower Leg) Wound Laterality: Left, Circumferential Cleanser Soap and Water Discharge Instruction: May shower and wash wound with dial antibacterial soap and water prior to dressing change. Peri-Wound Care Sween Lotion (Moisturizing lotion) Discharge Instruction: Apply moisturizing lotion to dry skin on legs Topical Primary Dressing Hydrofera Blue Ready Foam, 4x5 in Discharge Instruction: Apply to  wound bed as instructed Secondary Dressing ABD Pad, 8x10 Discharge Instruction: Apply over primary dressing as directed. Zetuvit Plus 4x4 in Discharge Instruction: or equivalent extra absorbent pad.Apply over primary dressing as needed. Secured With Compression Wrap ThreePress (3 layer compression wrap) Discharge Instruction: Apply three layer compression as directed. Pad bend of ankle with foam or ABD pad. Compression Stockings Add-Ons Electronic Signature(s) Signed: 03/13/2021 5:46:28 PM By: [name] RN, BSN Entered By: [name] on 03/13/2021 13:42:47 -------------------------------------------------------------------------------- Wound Assessment Details Patient Name: Date of Service: [name]. 03/13/2021 1:00 PM Medical Record Number: [id number] Patient Account Number: [id number] Date of Birth/Sex: Treating RN: 17-Dec-1950 (70 y.o. [name] Primary Care Aladdin Kollmann: [name] Other Clinician: Referring Kristopher Delk: Treating Shontavia Mickel/Extender: Joaquim Lai  III, Hazel Sams, [name] in Treatment: 188 Wound Status Wound Number: 64 Primary Diabetic Wound/Ulcer of the Lower Extremity Etiology: Wound Location: Left, Dorsal Foot Wound Open Wounding Event: Gradually Appeared Status: Date Acquired: 06/01/2019 Comorbid Asthma, Hypertension, Peripheral Arterial Disease, Peripheral [name] Of Treatment: 93 History: Venous Disease, Type II Diabetes, Gout, Osteoarthritis Clustered Wound: No Photos Wound Measurements Length: (cm) 6 Width: (cm) 7 Depth: (cm) 0.1 Area: (cm) 32.987 Volume: (cm) 3.299 % Reduction in Area: -19892.1% % Reduction in Volume: -6632.7% Epithelialization: Small (1-33%) Tunneling: No Undermining: No Wound Description Classification: Grade 2 Wound Margin: Distinct, outline attached Exudate Amount: Large Exudate Type: Serous Exudate Color: amber Foul Odor After Cleansing: No Slough/Fibrino Yes Wound Bed Granulation  Amount: Small (1-33%) Exposed Structure Granulation Quality: Red Fascia Exposed: No Necrotic Amount: Large (67-100%) Fat Layer (Subcutaneous Tissue) Exposed: Yes Necrotic Quality: Adherent Slough Tendon Exposed: No Muscle Exposed: No Joint Exposed: No Bone Exposed: No Treatment Notes Wound #64 (Foot) Wound Laterality: Dorsal, Left Cleanser Soap and Water Discharge Instruction: May shower and wash wound with dial antibacterial soap and water prior to dressing change. Peri-Wound Care Sween Lotion (Moisturizing lotion) Discharge Instruction: Apply moisturizing lotion to dry skin on legs Topical Primary Dressing Hydrofera Blue Ready Foam, 4x5 in Discharge Instruction: Apply to wound bed as instructed Secondary Dressing ABD Pad, 8x10 Discharge Instruction: Apply over primary dressing as directed. Zetuvit Plus 4x4 in Discharge Instruction: or equivalent extra absorbent pad.Apply over primary dressing as needed. Secured With Compression Wrap ThreePress (3 layer compression wrap) Discharge Instruction: Apply three layer compression as directed. Pad bend of ankle with foam or ABD pad. Compression Stockings Add-Ons Electronic Signature(s) Signed: 03/13/2021 5:46:28 PM By: [name] RN, BSN Entered By: [name] on 03/13/2021 13:44:23 -------------------------------------------------------------------------------- Wound Assessment Details Patient Name: Date of Service: [name]. 03/13/2021 1:00 PM Medical Record Number: [id number] Patient Account Number: [id number] Date of Birth/Sex: Treating RN: 27-Apr-1951 (70 y.o. [name] Primary Care Xavior Niazi: [name] Other Clinician: Referring Larz Mark: Treating Telicia Hodgkiss/Extender: [name], [name] in Treatment: 188 Wound Status Wound Number: 67 Primary Diabetic Wound/Ulcer of the Lower Extremity Etiology: Wound Location: Right, Medial Lower Leg Wound Open Wounding Event: Gradually  Appeared Status: Date Acquired: 05/02/2020 Comorbid Asthma, Hypertension, Peripheral Arterial Disease, Peripheral [name] Of Treatment: 45 History: Venous Disease, Type II Diabetes, Gout, Osteoarthritis Clustered Wound: No Photos Wound Measurements Length: (cm) 7 Width: (cm) 7.5 Depth: (cm) 0.1 Area: (cm) 41.233 Volume: (cm) 4.123 % Reduction in Area: -2816.1% % Reduction in Volume: -2824.1% Epithelialization: Medium (34-66%) Tunneling: No Undermining: No Wound Description Classification: Grade 1 Wound Margin: Distinct, outline attached Exudate Amount: Large Exudate Type: Purulent Exudate Color: yellow, brown, green Foul Odor After Cleansing: No Slough/Fibrino Yes Wound Bed Granulation Amount: Medium (34-66%) Exposed Structure Granulation Quality: Pink Fascia Exposed: No Necrotic Amount: Medium (34-66%) Fat Layer (Subcutaneous Tissue) Exposed: Yes Tendon Exposed: No Muscle Exposed: No Joint Exposed: No Bone Exposed: No Treatment Notes Wound #67 (Lower Leg) Wound Laterality: Right, Medial Cleanser Soap and Water Discharge Instruction: May shower and wash wound with dial antibacterial soap and water prior to dressing change. Peri-Wound Care Sween Lotion (Moisturizing lotion) Discharge Instruction: Apply moisturizing lotion to dry skin on legs Topical Primary Dressing Hydrofera Blue Ready Foam, 4x5 in Discharge Instruction: Apply to wound bed as instructed Secondary Dressing ABD Pad, 8x10 Discharge Instruction: Apply over primary dressing as directed. Zetuvit Plus 4x4 in Discharge Instruction: or equivalent extra absorbent pad.Apply over primary dressing as needed. Secured With Compression  Wrap ThreePress (3 layer compression wrap) Discharge Instruction: Apply three layer compression as directed. Pad bend of ankle with foam or ABD pad. Compression Stockings Add-Ons Electronic Signature(s) Signed: 03/13/2021 5:46:28 PM By: [name] RN, BSN Entered By:  [name] on 03/13/2021 13:49:11 -------------------------------------------------------------------------------- Wound Assessment Details Patient Name: Date of Service: [name]. 03/13/2021 1:00 PM Medical Record Number: [id number] Patient Account Number: [id number] Date of Birth/Sex: Treating RN: [birth year] (70 y.o. [name] Primary Care Alaney Witter: [name] Other Clinician: Referring Melony Tenpas: Treating Artrell Lawless/Extender: [name], [name] in Treatment: 188 Wound Status Wound Number: 71 Primary Lymphedema Etiology: Wound Location: Right, Posterior Lower Leg Wound Open Wounding Event: Gradually Appeared Status: Date Acquired: 01/23/2021 Comorbid Asthma, Hypertension, Peripheral Arterial Disease, Peripheral [name] Of Treatment: 7 History: Venous Disease, Type II Diabetes, Gout, Osteoarthritis Clustered Wound: No Photos Wound Measurements Length: (cm) 5 Width: (cm) 3 Depth: (cm) 0.1 Area: (cm) 11.781 Volume: (cm) 1.178 % Reduction in Area: 28.6% % Reduction in Volume: 28.6% Epithelialization: Small (1-33%) Tunneling: No Undermining: No Wound Description Classification: Full Thickness Without Exposed Support Structures Wound Margin: Distinct, outline attached Exudate Amount: Medium Exudate Type: Purulent Exudate Color: yellow, brown, green Foul Odor After Cleansing: No Slough/Fibrino Yes Wound Bed Granulation Amount: Medium (34-66%) Exposed Structure Granulation Quality: Red Fascia Exposed: No Necrotic Amount: Medium (34-66%) Fat Layer (Subcutaneous Tissue) Exposed: Yes Necrotic Quality: Adherent Slough Tendon Exposed: No Muscle Exposed: No Joint Exposed: No Bone Exposed: No Treatment Notes Wound #71 (Lower Leg) Wound Laterality: Right, Posterior Cleanser Soap and Water Discharge Instruction: May shower and wash wound with dial antibacterial soap and water prior to dressing change. Peri-Wound Care Sween Lotion  (Moisturizing lotion) Discharge Instruction: Apply moisturizing lotion to dry skin on legs Topical Primary Dressing Hydrofera Blue Ready Foam, 4x5 in Discharge Instruction: Apply to wound bed as instructed Secondary Dressing ABD Pad, 8x10 Discharge Instruction: Apply over primary dressing as directed. Zetuvit Plus 4x4 in Discharge Instruction: or equivalent extra absorbent pad.Apply over primary dressing as needed. Secured With Compression Wrap ThreePress (3 layer compression wrap) Discharge Instruction: Apply three layer compression as directed. Pad bend of ankle with foam or ABD pad. Compression Stockings Add-Ons Electronic Signature(s) Signed: 03/13/2021 5:46:28 PM By: [name] RN, BSN Entered By: [name] on 03/13/2021 13:52:04 -------------------------------------------------------------------------------- Wound Assessment Details Patient Name: Date of Service: [name]. 03/13/2021 1:00 PM Medical Record Number: [id number] Patient Account Number: [id number] Date of Birth/Sex: Treating RN: Nov 29, 1950 (70 y.o. [name] Primary Care Lukah Goswami: [name] Other Clinician: Referring Kaysie Michelini: Treating Terran Klinke/Extender: [name], [name] in Treatment: 188 Wound Status Wound Number: 72 Primary Lymphedema Etiology: Wound Location: Right, Dorsal Foot Secondary Diabetic Wound/Ulcer of the Lower Extremity Wounding Event: Gradually Appeared Etiology: Date Acquired: 02/20/2021 Wound Open [name] Of Treatment: 3 Status: Clustered Wound: No Comorbid Asthma, Hypertension, Peripheral Arterial Disease, Peripheral History: Venous Disease, Type II Diabetes, Gout, Osteoarthritis Photos Wound Measurements Length: (cm) 1 Width: (cm) 2 Depth: (cm) 0.1 Area: (cm) 1.571 Volume: (cm) 0.157 % Reduction in Area: 44.4% % Reduction in Volume: 44.5% Epithelialization: Medium (34-66%) Tunneling: No Undermining: No Wound  Description Classification: Full Thickness Without Exposed Support Structures Wound Margin: Distinct, outline attached Exudate Amount: Large Exudate Type: Serosanguineous Exudate Color: red, brown Foul Odor After Cleansing: No Slough/Fibrino Yes Wound Bed Granulation Amount: Large (67-100%) Exposed Structure Granulation Quality: Red Fascia Exposed: No Necrotic Amount: Small (1-33%) Fat Layer (Subcutaneous Tissue) Exposed: Yes Necrotic Quality: Adherent Slough Tendon Exposed: No Muscle Exposed:  No Joint Exposed: No Bone Exposed: No Treatment Notes Wound #72 (Foot) Wound Laterality: Dorsal, Right Cleanser Soap and Water Discharge Instruction: May shower and wash wound with dial antibacterial soap and water prior to dressing change. Peri-Wound Care Sween Lotion (Moisturizing lotion) Discharge Instruction: Apply moisturizing lotion to dry skin on legs Topical Primary Dressing Hydrofera Blue Ready Foam, 4x5 in Discharge Instruction: Apply to wound bed as instructed Secondary Dressing ABD Pad, 8x10 Discharge Instruction: Apply over primary dressing as directed. Zetuvit Plus 4x4 in Discharge Instruction: or equivalent extra absorbent pad.Apply over primary dressing as needed. Secured With Compression Wrap ThreePress (3 layer compression wrap) Discharge Instruction: Apply three layer compression as directed. Pad bend of ankle with foam or ABD pad. Compression Stockings Add-Ons Electronic Signature(s) Signed: 03/13/2021 5:46:28 PM By: [name] RN, BSN Entered By: [name] on 03/13/2021 13:47:49 -------------------------------------------------------------------------------- Wound Assessment Details Patient Name: Date of Service: [name]. 03/13/2021 1:00 PM Medical Record Number: [id number] Patient Account Number: [id number] Date of Birth/Sex: Treating RN: 04-02-1951 (70 y.o. [name] Primary Care Kwane Rohl: [name] Other  Clinician: Referring [place]ford Shannahan: Treating Erroll Wilbourne/Extender: [name], [name] in Treatment: 188 Wound Status Wound Number: [id number] Primary Lymphedema Etiology: Wound Location: Right, Lateral Lower Leg Secondary Diabetic Wound/Ulcer of the Lower Extremity Wounding Event: Gradually Appeared Etiology: Date Acquired: 02/20/2021 Date Acquired: 02/20/2021 Wound Healed - Epithelialized [name] Of Treatment: 3 Status: Clustered Wound: No Comorbid Asthma, Hypertension, Peripheral Arterial Disease, Peripheral History: Venous Disease, Type II Diabetes, Gout, Osteoarthritis Photos Wound Measurements Length: (cm) Width: (cm) Depth: (cm) Area: (cm) Volume: (cm) 0 % Reduction in Area: 100% 0 % Reduction in Volume: 100% 0 0 0 Wound Description Classification: Full Thickness Without Exposed Support Structur es Electronic Signature(s) Signed: 03/13/2021 5:46:28 PM By: [name] RN, BSN Entered By: [name] on 03/13/2021 13:53:33 -------------------------------------------------------------------------------- Vitals Details Patient Name: Date of Service: [name]. 03/13/2021 1:00 PM Medical Record Number: [id number] Patient Account Number: [id number] Date of Birth/Sex: Treating RN: 1950-09-17 (70 y.o. [name] Primary Care Zell Hylton: [name] Other Clinician: Referring Isaic Syler: Treating Ellise Kovack/Extender: [name], [name] in Treatment: 188 Vital Signs Time Taken: 13:24 Temperature (F): 98.7 Height (in): 62 Pulse (bpm): 106 Weight (lbs): 335 Respiratory Rate (breaths/min): 20 Body Mass Index (BMI): 61.3 Blood Pressure (mmHg): 167/86 Capillary Blood Glucose (mg/dl): 300 Reference Range: 80 - 120 mg / dl Electronic Signature(s) Signed: 03/13/2021 5:34:22 PM By: [name] Entered By: [name] on 03/13/2021 13:25:10

## 2021-03-13 NOTE — Progress Notes (Addendum)
[name], [name] ([id number]) Visit Report for 03/13/2021 Chief Complaint Document Details Patient Name: Date of Service: [name], [name] 03/13/2021 1:00 PM Medical Record Number: [id number] Patient Account Number: [id number] Date of Birth/Sex: Treating RN: Mar 28, 1951 (70 y.o. [name] Primary Care Provider: [name] Other Clinician: Referring Provider: Treating Provider/Extender: Sallye Ober, Loree Fee Weeks in Treatment: [place] from: Patient Chief Complaint Bilateral reoccurring LE ulcers Electronic Signature(s) Signed: 03/13/2021 1:36:44 PM By: [name] PA-C Entered By: [name] on 03/13/2021 13:36:44 -------------------------------------------------------------------------------- HPI Details Patient Name: Date of Service: [name]. 03/13/2021 1:00 PM Medical Record Number: [id number] Patient Account Number: [id number] Date of Birth/Sex: Treating RN: 1950/07/23 (70 y.o. [name] Primary Care Provider: [name] Other Clinician: Referring Provider: Treating Provider/Extender: Sallye Ober, Loree Fee Weeks in Treatment: 188 History of Present Illness HPI Description: this patient has been seen a couple of times before and returns with recurrent problems to her right and left lower extremity with swelling and weeping ulcerations due to not wearing her compression stockings which she had been advised to do during her last discharge, at the end of June 2018. During her last visit the patient had had normal arterial blood flow and her venous reflux study did not necessitate any surgical intervention. She was recommended compression and elevation and wound care. After prolonged treatment the patient was completely healed but she has been noncompliant with wearing or compressions.. She was here last week with an outpatient return visit planned but the patient came in a very poor general condition with  altered mental status and was rushed to the ER on my request. With a history of hypertension, diabetes, TIA and right-sided weakness she was set up for an MRI on her brain and cervical spine and was sent to [facility]. Getting an MRI done was very difficult but once the workup was done she was found not to have any spinal stenosis, epidural abscess or hematoma or discitis. This was radiculopathy to be treated as an outpatient and she was given a follow-up appointment. Today she is feeling much better alert and oriented and has come to reevaluate her bilateral lower extremity lymphedema and ulceration 03/25/2017 -- she was admitted to the hospital on 03/16/2017 and discharged on 03/18/2017 with left leg cellulitis and ulceration. She was started on vancomycin and Zosyn and x-ray showed no bony involvement. She was treated for a cellulitis with IV antibiotics changed to Rocephin and Flagyl and was discharged on oral Keflex and doxycycline to complete a 7 day course. Last hemoglobin A1c was 7.1 and her other ailments including hypertension got asthma were appropriately treated. 05/06/2017 -- she is awaiting the right size of compression stockings from  but other than that has been doing well. ====== Old notes 70 year old patient was seen one time last October and was lost to follow-up. She has recurrent problems with weeping and ulceration of her left lower extremity and has swelling of this for several years. It has been worse for the last 2 months. Past medical history is significant for diabetes mellitus type 2, hypertension, gout, morbid obesity, depressive disorders, hiatal hernia, migraines, status post knee surgery, risk of a cholecystectomy, vaginal hysterectomy and breast biopsy. She is not a smoker. As noted before she has never had a venous duplex study and an arterial ABI study was attempted but the left lower extremity was noncompressible 10/01/2016 -- had a lower extremity venous  duplex reflux evaluation which showed no evidence  of deep vein reflux in the right or left lower extremity, and no evidence of great saphenous vein reflux more than 500 ms in the right or left lower extremity, and the left small saphenous vein is incompetent but no vascular consult was recommended. review of her electronic medical records noted that the ABI was checked in July 2017 where the right ABI was normal limits and the left ABI could not be ascertained due to pain with cuff pressure but the waveforms are within normal limits. her arterial duplex study scheduled for April 27. 10/08/2016 -- the patient has various reasons for not having a compression on and for the last 3 days she has had no compression on her left lower extremity either due to pain or the lack of nursing help. She does not use her juxta lites either. 10/15/2016 -- the patient did not keep her appointment for arterial duplex study on April 27 and I have asked her to reschedule this. Her pain is out of proportion with the physical findings and she continuously fails to wear a compression wraps and cuts them off because she says she cannot tolerate the pain. She does not use her juxta lites either. 10/22/2016 -- he has rescheduled her arterial duplex study to May 21 and her pain today is a bit better. She has not been wearing her juxta lites on her right lower extremity but now understands that she needs to do this. She did tolerate the to press compression wrap on her left lower extremity 10/29/2016 --arterial duplex study is scheduled for next week and overall she has been tolerating her compression wraps and also using her juxta lites on her right lower extremity 11/05/2016 -- the right ABI was 0.95 the left was 1.03. The digit TBI is on the right was 0.83 on the left was 0.92 and she had biphasic flow through these vessels. The impression was that of normal lower extremity arterial study. 11/12/2016 -- her pain is minimal  and she is doing very well overall. 11/26/2016 -- she has got juxta lites and her insurance will not pay for additional dual layer compression stockings. She is going to order some from [place]. 05/12/2017 -- her juxta lites are very old and too big for her and these have not been helping with compression. She did get 20-30 mm compression stockings from [place] but she and her husband are unable to put these on. I believe she will benefit from bilateral Extremit-ease, compression stockings and we will measure her for these today. 05/20/2017 -- lymphedema on the left lower extremity has increased a lot and she has a open ulceration as a result of this. The right lower extremity is looking pretty good. She has decided to by the compression stockings herself and will get reimbursed by the home health, at a later date. 05/27/2017 -- her sciatica is bothering her a lot and she thought her left leg pain was caused due to the compression wrap and hence removed it and has significant lymphedema. There is no inflammation on this left lower extremity. 06/17/17 on evaluation today patient appears to be doing very well and in fact is completely healed in regard to her ulcerations. Unfortunately however she does have continued issues with lymphedema nonetheless. We did order compression garments for her unfortunately she states that the size that she received were large although we ordered medium. Obviously this means she is not getting the optimal compression. She does not have those with her today and therefore we could  not confirm and contact the company on her behalf. Nonetheless she does state that she is going to have her husband bring them by tomorrow so that we can verify and then get in touch with the company. No fevers, chills, nausea, or vomiting noted at this time. Overall patient is doing better otherwise and I'm pleased with the progress she has made. 07/01/17 on evaluation today patient appears to be  doing very well in regard to her bilateral lower extremity she does not have any openings at this point which is excellent news. Overall I'm pleased with how things have progressed up to this time. Since she is doing so well we did order her compression which we are seeing her today to ensure that it fits her properly and everything is doing well in that regard and then subsequently she will be discharged. ============ Old Notes: 03/31/16 patient presents today for evaluation concerning open wounds that she has over the left medial ankle region as well as the left dorsal foot. She has previously had this occur although it has been healed for a number of months after having this for about a year prior until her hospitalization on 01/05/16. At that point in time it appears that she was admitted to the hospital for left lower extremity cellulitis and was placed on vancomycin and Zosyn at that point. Eventually upon discharge on January 15, 2016 she was placed on doxycycline at that point in time. Later on 03/27/16 positive wound culture growing Escherichia coli this was switched to amoxicillin. Currently she tells me that she is having pain radiated to be a 7 out of 10 which can be as high as 10 out of 10 with palpation and manipulation of the wound. This wound appears to be mainly venous in nature due to the bilateral lower extremity venous stasis/lymphedema. This is definitely much worse on her left than the right side. She does have type 1 diabetes mellitus, hypertension, morbid obesity, and is wheelchair dependent.during the course of the hospital stay a blood culture was also obtained and fortunately appeared negative. She also had an x-ray of the tibia/fibula on the left which showed no acute bone abnormality. Her white blood cell count which was performed last on 03/25/16 was 7.3, hemoglobin 12.8, protein 7.1, albumin 3.0. Her urine culture appeared to be negative for any specific organisms. Patient  did have a left lower extremity venous duplex evaluation for DVT . This did not include venous reflux studies but fortunately was negative for DVT Patient also had arterial studies performed which revealed that she had a . normal ABI on the right though this was unable to be performed on the left secondary to pain that she was having around the ankle region due to the wound. However it was stated on report that she had biphasic pulses and apparently good blood flow. ========== 06/03/17 she is here in follow-up evaluation for right lower extremity ulcer. The right lower sure he has healed but she has reopened to the left medial malleolus and dorsal foot with weeping. She is waiting for new compression garments to arrive from home health, the previous compression garments were ill fitting. We will continue with compression bilaterally and follow-up in 2 weeks Readmission: 08/05/17 on evaluation today patient appears to be doing somewhat poorly in regard to her left lower extremity especially although the right lower extremity has a small area which may no longer be open. She has been having a lot of drainage from the left lower  extremity however he tells me that she has not been able to use the EXTREMIT-EASE Compression at this point. She states that she did better and was able to actually apply the Juxta-Lite compression although the wound that she has is too large and therefore really does not compress which is why she cannot wear it at this point. She has no one who can help her put it on regular basis her son can sometimes but he's not able to do it most of the time. I do believe that's why she has begun to weave and have issues as she is currently yet again. No fevers, chills, nausea, or vomiting noted at this time. Patient is no evidence of dementia. 08/12/17 on evaluation today patient appears to still be doing fairly well in regard to the draining areas/weeping areas at this point. With that  being said she unfortunately did go to the ER yesterday due to what was felt to be possibly a cellulitis. They place her on doxycycline by mouth and discharge her home. She definitely was not admitted. With that being said she states she has had more discomfort which has been unusual for her even compared to prior times and she's had infections.08/12/17 on evaluation today patient appears to still be doing fairly well in regard to the draining areas/weeping areas at this point. With that being said she unfortunately did go to the ER yesterday due to what was felt to be possibly a cellulitis. They place her on doxycycline by mouth and discharge her home. She definitely was not admitted. With that being said she states she has had more discomfort which has been unusual for her even compared to prior times and she's had infections. 08/19/17 put evaluation today patient tells me that she's been having a lot of what sounds to be neuropathic type pain in regard to her left lower extremity. She has been using over-the-counter topical bins again which some believe. That in order to apply the she actually remove the wrap we put on her last Wednesday on Thursday. Subsequently she has not had anything on compression wise since that time. The good news is a lot of the weeping areas appear to have closed at this point again I believe she would do better with compression but we are struggling to get her to actually use what she needs to at this point. No fevers, chills, nausea, or vomiting noted at this time. 09/03/17 on evaluation today patient appears to be doing okay in regard to her lower extremities in regard to the lymphedema and weeping. Fortunately she does not seem to show any signs of infection at this point she does have a little bit of weeping occurring in the right medial malleolus area. With that being said this does not appear to be too significant which is good news. 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 She has severe skin damage secondary to both of these features involving the dorsal left foot and medial left ankle and lower leg. Still has open areas in the left anterior foot. The area on the right closed over. She uses her own juxta light stockings. She does not have an arterial issue 09/16/17 on evaluation today patient actually appears to be doing excellent in regard to her bilateral lower extremity swelling. The Juxta-Lite compression wrap seem to be doing very well for her. She has not however been using the portion that goes over her foot. Her left  foot still is draining a little bit not nearly as significant as it has been in the past but still I do believe that she likely needs to utilize the full wrap including the foot portion of this will improve as well. She also has been apparently putting on a significant amount of Vaseline which also think is not helpful for her. I recommended that if she feels she needs something for moisturizer Eucerin will probably be better. 09/30/17 on evaluation today patient presents with several new open areas in regard to her left lower extremity although these appear to be minimal and mainly seem to be more moisture breakdown than anything. Fortunately she does not seem to have any evidence of infection which is great news. She has been tolerating the dressing changes without complication we are using silver alginate on the foot she has been using AB pads to have the legs and using her Juxta- Lite compression which seems to be controlling her swelling very well. Overall I'm pleased with the poor way she has progressed. 10/14/17 on evaluation today patient appears to be doing better in regard to her left lower extremity areas of weeping. She does still have some discomfort although in general this does not appear to be as macerated and I think it is progressing nicely. I do think she  still needs to wear the foot portion of her Juxta- Lite in order to get the most benefit from the wrap obviously. She states she understands. Fortunately there does not appear to be evidence of infection at this time which is great news. 10/28/17 on evaluation today patient appears to be doing excellent in regard to her left lower extremity. She has just a couple areas that are still open and seem to be causing any trouble whatsoever. For that reason I think that she is definitely headed in the right direction the spots are very tiny compared to what we have been dealing with in the past. 11/11/17 on evaluation today patient appears to have a right lateral lower extremity ulcer that has opened since I last saw her. She states this is where the home health nurse that was coming out remove the dressing without wetting the alginate first. Nonetheless I do not know if this is indeed the case or not but more importantly we have not ordered home help to be coming out for her wounds at all. I'm unsure as to why they are coming out and we're gonna have to check on this and get things situated in that regard. With that being said we currently really do not need them to be coming out as the patient has been taking care of her leg herself without complication and no issues. In fact she was doing much better prior to nursing coming out. 11/25/17 on evaluation today patient actually appears to be doing fairly well in regard to her left lower extremity swelling. In fact she has very little area of weeping at this point there's just a small spot on the lateral portion of her right leg that still has me just a little bit more concerned as far as wanting to see this clear up before I discharge her to caring for this at home. Nonetheless overall she has made excellent progress. 12/09/17 on evaluation today patient appears to be doing rather well in regard to her lower extremity edema. She does have some weeping still in the  left lower extremity although the big area we were taking care of two weeks ago  actually has closed and she has another area of weeping on the left lower extremity immediately as well is the top of her foot. She does not currently have lymphedema pumps she has been wearing her compression daily on a regular basis as directed. With that being said I think she may benefit from lymphedema pumps. 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 We had a very difficult time getting and keeping this under control. 12/23/17 on evaluation today patient actually appears to be doing a little bit more poorly in regard to her bilateral lower extremities. She has been tolerating the Juxta-Lite compression wraps. Unfortunately she has two new ulcers on the right lower extremity and left lower [place] ulceration seems to be larger. Obviously this is not good news. She has been tolerating the dressings without complication. 12/30/17 on evaluation today patient actually appears to be doing much better in regard to her bilateral lower extremity edema. She continues to have some issues with ulcerations and in fact there appears to be one spot on each leg where the wrap may have caused a little bit of a blister which is subsequently opened up at this point is given her pain. Fortunately it does not appear to be any evidence of infection which is good news. No fevers chills noted. 01/13/18 on evaluation today patient appears to be doing rather well in regard to her bilateral lower extremities. The dressings did get kind of stuck as far as the wound beds are concerned but again I think this is mainly due to the fact that she actually seems to be showing signs of healing which is good news. She's not having as much drainage therefore she was having more of the dressing sticking.  Nonetheless overall I feel like her swelling is dramatically down compared to previous. 01/20/18 on evaluation today patient unfortunately though she's doing better in most regards has a large blister on the left anterior lower extremity where she is draining quite significantly. Subsequently this is going to need debridement today in order to see what's underneath and ensure she does not continue to trapping fluid at this location. Nonetheless No fevers, chills, nausea, or vomiting noted at this time. 01/27/18 on evaluation today patient appears to be doing rather well at this point in regard to her right lower extremity there's just a very small area that she still has open at this point. With that being said I do believe that she is tolerating the compression wraps very well in making good progress. Home health is coming out at this point to see her. Her left lower extremity on the lateral portion is actually what still mainly open and causing her some discomfort for the most part 02/10/18 on evaluation today patient actually appears to be doing very well in regard to her right lower extremity were all the ulcers appear to be completely close. In regard to the left lower extremity she does have two areas still open and some leaking from the dorsal surface of her foot but this still seems to be doing much better to me in general. 02/24/18 on evaluation today patient actually appears to be doing much better in regard to her right lower extremity this is still completely healed. Her left lower extremity is also doing much better fortunately she has no evidence of infection. The one area that  is gonna require some debridement is still on the left anterior shin. Fortunately this is not hurting her as badly today. 03/10/18 on evaluation today patient appears to be doing better in some regards although she has a little bit more open area on the dorsal foot and she also has some issues on the medial portion of  the left lower extremity which is actually new and somewhat deep. With that being said there fortunately does not appear to be any significant signs of infection which is good news. No fevers, chills, nausea, or vomiting noted at this time. In general her swelling seems to be doing fairly well which is good news. 03/31/18 on evaluation today patient presents for follow-up concerning her left lower extremity lymphedema. Unfortunately she has been doing a little bit more poorly since I last saw her in regard to the amount of weeping that she is experiencing. She's also having some increased pain in the anterior shin location. Unfortunately I do not feel like the patient is making such good progress at this point a few weeks back she was definitely doing much better. 04/07/18 on evaluation today patient actually appears to be showing some signs of improvement as far as the left lower extremity is concerned. She has been tolerating the dressing changes and it does appear that the Drawtex did better for her. With that being said unfortunately home health is stating that they cannot obtain the Drawtex going forward. Nonetheless we're gonna have to check and see what they may be able to get the alginate they were using was getting stuck in causing new areas of skin being pulled all that with and subsequently weep and calls her to worsen overall this is the first time we've seen improvement at this time. 04/14/18 on evaluation today patient actually appears to be doing rather well at this point there does not appear to be any evidence of infection at this time and she is actually doing excellent in regard to the weeping in fact she almost has no openings remaining even compared to just last week this is a dramatic improvement. No fevers chills noted 04/21/18 evaluation today patient actually appears to be doing very well. She in fact is has a small area on the posterior lower extremity location and she has  a small area on the dorsal surface of her foot that are still open both of which are very close to closing. We're hoping this will be close shortly. She brought her Juxta-Lite wrap with her today hoping that would be able to put her in it unfortunately I don't think were quite at that point yet but we're getting closer. 04/28/18 upon evaluation today patient actually appears to be doing excellent in regard to her left lower extremity ulcer. In fact the region on the posterior lower extremity actually is much smaller than previously noted. Overall I'm very happy with the progress she has made. She again did bring her Juxta-Lite although we're not quite ready for that yet. 05/11/18 upon evaluation today patient actually appears to be doing in general fairly well in regard to her left lower Trinity. The swelling is very well controlled. With that being said she has a new area on the left anterior lower extremity as well as between the first and second toes of her left foot that was not present during the last evaluation. The region of her posterior left lower extremity actually appears to be almost completely healed. T be honest I'm very pleased with o  the way that stands. Nonetheless I do believe that the lotion may be keeping the area to moist as far as her legs are concerned subsequently I'm gonna consider discontinuing that today. 05/26/18 on evaluation today patient appears to be doing rather well in regard to her left lower should be ulcers. In fact everything appears to be close except for a very small area on the left posterior lower extremity. Fortunately there does not appear to be any evidence of infection at this time. Overall very pleased with her progress. 06/02/18 and evaluation today patient actually appears to be doing very well in regard to her lower extremity ulcers. She has one small area that still continues to weep that I think may benefit her being able to justify lotion and user  Juxta-Lite wraps versus continued to wrap her. Nonetheless I think this is something we can definitely look into at this point. 06/23/18 on evaluation today patient unfortunately has openings of her bilateral lower extremities. In general she seems to be doing much worse than when I last saw her just as far as her overall health standpoint is concerned. She states that her discomfort is mainly due to neuropathy she's not having any other issues otherwise. No fevers, chills, nausea, or vomiting noted at this time. 06/30/18 on evaluation today patient actually appears to be doing a little worse in regard to her right lower extremity her left lower extremity of doing fairly well. Fortunately there is no sign of infection at this time. She has been tolerating the dressing changes without complication. Home health did not come out like they were supposed to for the appropriate wrap changes. They stated that they never received the orders from [place] which were fax. Nonetheless we will send a copy of the orders with the patient today as well. 07/07/18 on evaluation today patient appears to be doing much better in regard to lower extremities. She still has several openings bilaterally although since I last saw her her legs did show obvious signs of infection when she later saw her nurse. Subsequently a culture was obtained and she is been placed on Bactrim and Keflex. Fortunately things seem to be looking much better it does appear she likely had an infection. Again last week we'd even discussed it but again there really was not any obvious sign that she had infection therefore we held off on the antibiotics. Nonetheless I'm glad she's doing better today. 07/14/18 on evaluation today patient appears to be doing much better regarding her bilateral lower Trinity's. In fact on the right lower for me there's nothing open at this point there are some dry skin areas at the sites where she had infection. Fortunately there is  no evidence of systemic infection which is excellent news. No fevers chills noted 07/21/18 on evaluation today patient actually appears to be doing much better in regard to her left lower extremity ulcers. She is making good progress and overall I feel like she's improving each time I see her. She's having no pain I do feel like the infection is completely resolved which is excellent news. No fevers, chills, nausea, or vomiting noted at this time. 07/28/18 on evaluation today patient appears to be doing very well in regard to her left lower [employer]. Everything seems to be showing signs of improvement which is excellent news. Overall very pleased with the progress that has been made. Fortunately there's no evidence of active infection at this time also excellent news. 08/04/18 on evaluation today patient  appears to be doing more poorly in regard to her bilateral lower extremities. She has two new areas open up on the right and these were completely closed as of last week. She still has the two spots on the left which in my pinion seem to be doing better. Fortunately there's no evidence of infection again at this point. 08/11/18 on evaluation today patient actually appears to be doing very well in regard to her bilateral lower Trinity wounds that all seem to be doing better and are measures smaller today. Fortunately there's no signs of infection. No fevers, chills, nausea, or vomiting noted at this time. 08/18/18 on evaluation today patient actually appears to be doing about the same inverter bilateral lower extremities. She continues to have areas that blistering open as was drain that fortunately nothing too significant. Overall I feel like Drawtex may have done better for her however compared to the collagen. 08/25/18 on evaluation today patient appears to be doing a little bit more poorly today even compared to last time I saw her. Again I'm not exactly sure why she's making worse progress over  the past several weeks. I'm beginning to wonder if there is some kind of underlying low level infection causing this issue. I did actually take a culture from the left anterior lower extremity but it was a new wound draining quite a bit at this point. Unfortunately she also seems to be having more pain which is what also makes me worried about the possibility of infection. This is despite never erythema noted at this point. 09/01/18 on evaluation today patient actually appears to be doing a little worse even compared to last week in regard to bilateral lower extremities. She did go to the hospital on the 16th was given a dose of IV Zosyn and then discharged with a recommendation to continue with the Bactrim that I previously prescribed for her. Nonetheless she is still having a lot of discomfort she tells me as well at this time. This is definitely unfortunate. No fevers, chills, nausea, or vomiting noted at this time. 09/08/18 on evaluation today patient's bilateral lower extremities actually appear to be shown signs of improvement which is good news. Fortunately there does not appear to be any signs of active infection I think the anabiotic is helping in this regard. Overall I'm very pleased with how she is progressing. 09/15/18 patient was actually seen in ER yesterday due to her legs as well unfortunately. She states she's been having a lot of pain and discomfort as well as a lot of drainage. Upon inspection today the patient does have a lot of swelling and drainage I feel like this is more related to lymphedema and poor fluid control than it is to infection based on what I'm seeing. The physician in the emergency department also doubted that the patient was having a significant infection nonetheless I see no evidence of infection obvious at this point although I do see evidence of poor fluid control. She still not using a compression pumps, she is not elevating due to her lift chair as well as her  hospital bed being broken, and she really is not keeping her legs up as much as they should be and also has been taking off her wraps. All this combined I think has led to poor fluid control and to be honest she may be somewhat volume overloaded in general as well. I recommend that she may need to contact your physician to see if a prescription for  a diuretic would be beneficial in their opinion. As long as this is safe I think it would likely help her. 09/29/18 on evaluation today patient's left lower extremity actually appears to be doing quite a bit better. At least compared to last time that I saw her. She still has a large area where she is draining from but there's a lot of new skin speckled trout and in fact there's more new skin that there are open areas of weeping and drainage at this point. This is good news. With regard to the right lower extremity this is doing much better with the only open area that I really see being a dry spot on the right lateral ankle currently. Fortunately there's no signs of active infection at this time which is good news. No fevers, chills, nausea, or vomiting noted at this time. The patient seems somewhat stressed and overwhelmed during the visit today she was very lethargic as such. She does and she is not taking any pain medications at this point. Apparently according to her husband are also in the process of moving which is probably taking its toll on her as well. 10/06/18 on evaluation today patient appears to be doing rather well in regard to her lower extremities compared to last evaluation. Fortunately there's no signs of active infection. She tells me she did have an appointment with her primary. Nonetheless he was concerned that the wounds were somewhat deep based on pictures but we never actually saw her legs. She states that he had her somewhat worried due to the fact that she was fearing now that she was [place] have to have an amputation. With that being  said based on what I'm seeing check she looks better this week that she has the last two times I've seen her with much less drainage I'm actually pleased in this regard. That doesn't mean that she's out of the water but again I do not think what the point of talking about education at all in regard to her leg. She is very happy to hear this. She is also not having as much pain as she was having last week. 10/13/18 unfortunately on evaluation today patient still continues to have a significant amount of drainage she's not letting home health actually apply the compression dressings at this point. She's trying to use of Juxta-Lite of the top of Kerlex and the second layer of the three layer compression wrap. With that being said she just does not seem to be making as good a progress as I would expect if she was having the compression applied and in place on a regular basis. No fevers, chills, nausea, or vomiting noted at this time. 10/20/18 on evaluation today patient appears to be doing a little better in regard to her bilateral lower extremity ulcers. In fact the right lower extremity seems to be healed she doesn't even have any openings at this point left lower extremity though still somewhat macerated seems to be showing signs of new skin growth at multiple locations throughout. Fortunately there's no evidence of active infection at this time. No fevers, chills, nausea, or vomiting noted at this time. 10/27/18 on evaluation today patient appears to be doing much better in regard to her left lower Trinity ulcer. She's been tolerating the laptop complication and has minimal drainage noted at this point. Fortunately there's no signs of active infection at this time. No fevers, chills, nausea, or vomiting noted at this time. 11/03/18 on evaluation today patient actually  appears to be doing excellent in regard to her left lower extremity. She is having very little drainage at this point there does not appear  to be any significant signs of infection overall very pleased with how things have gone. She is likewise extremely pleased still and seems to be making wonderful progress week to week. I do believe antibiotics were helpful for her. Her primary care provider did place on amateur clean since I last saw her. 11/17/18 on evaluation today patient appears to be doing worse in regard to her bilateral lower extremities at this point. She is been tolerating the dressing changes without complication. With that being said she typically takes the Coban off fairly quickly upon arriving home even after being seen here in the clinic and does not allow home health reapply command as part of the dressing at home. Therefore she said no compression essentially since I last saw her as best I can tell. With that being said I think it shows and how much swelling she has in the open wounds that are noted at this point. Fortunately there's no signs of infection but unfortunately if she doesn't get this under control I think she will end up with infection and more significant issues. 11/24/18 on evaluation today patient actually appears to be doing somewhat better in regard to her bilateral lower extremities. 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 Nonetheless that is doing better currently so she should be able to attempt reinitiating the lymphedema pumps at this time. No fevers, chills, nausea, or vomiting noted at this time. 12/01/18 upon evaluation today patient's left lower extremity appears to be doing quite well unfortunately her right lower extremity is not doing nearly as well. She has been tolerating the dressing changes without complication unfortunately she did not keep a wrap on the right at this time. Nonetheless I believe this has led to increased swelling and weeping in  the world is actually much larger than during the last evaluation with her. 12/08/18 on evaluation today patient appears to be doing about the same at this point in regard to her right lower extremity. There is some more palatable to touch I'm concerned about the possibility of there being some infection although I think the main issue is she's not keeping her compression wrap on which in turn is not allowing this area to heal appropriately. 12/22/18 on evaluation today patient appears to be doing better in regard to left lower extremity unfortunately significantly worse in regard to the right lower extremity. The areas of blistering and necrotic superficial tissue have spread and again this does not really appear to be signs of infection and all she just doesn't seem to be doing nearly as well is what she has been in the past. Overall I feel like the Augmentin did absolutely nothing for her she doesn't seem to have any infection again I really didn't think so last time either is more of a potential preventative measure and hoping that this would make some difference but I think the main issue is she's not wearing her compression. She tells me she cannot wear the [place] been we put on she takes it off pretty much upon getting home. Subsequently she worshiped Juxta-Lite when I questioned her about how often she wears it this is no more than  three hours a day obviously that leaves 21 hours that she has no compression and this is obviously not doing well for her. Overall I'm concerned that if things continue to worsen she is at great risk of both infection as well as losing her leg. 01/05/19 on evaluation today patient appears to be doing well in regard to her left lower extremity which he is allowing [place] to wrap and not so well with regard to her right lower extremity which she is not allowing [place] to really wrap and keep the wrap on. She states that it hurts too badly whenever it's wrapped and she ends up  having to take it off. She's been using the Juxta-Lite she tells me up to six hours a day although I question whether or not that's really been the case to be honest. Previously she told me three hours today nonetheless obviously the legs as long as the wrap is doing great when she is not is doing much more poorly. 01/12/2019 on evaluation today patient actually appears to be doing a little better in my opinion with regard to her right lower extremity ulcer. She has a small open area on the left lower extremity unfortunately but again this I think is part of the normal fluctuation of what she is going to have to expect with regard to her legs especially when she is not using her lymphedema pumps on a regular basis. Subsequently based on what I am seeing today I think that she does seem to be doing slightly better with regard to her right lower extremity she did see her primary care provider on Monday they felt she had an infection and placed her on 2 antibiotics. Both Cipro and clindamycin. Subsequently again she seems possibly to be doing a little bit better in regards to the right lower extremity she also tells me however she has been wearing the compression wrap over the past week since I spoke with her as well that is a Kerlix and Coban wrap on the right. No fevers, chills, nausea, vomiting, or diarrhea. 01/19/2019 on evaluation today patient appears to be doing better with regard to her bilateral lower extremities especially the right. I feel like the compression has been beneficial for her which is great news. She did get a call from her primary care provider on her way here today telling her that she did have methicillin- resistant Staphylococcus aureus and he was calling in a couple new antibiotics for her including a ointment to be applied she tells me 3 times a day. With that being said this sounds like likely to be Bactroban which I think could be applied with each dressing/wrap change but I  would not be able to accommodate her applying this 3 times a day. She is in agreement with the least doing this we will add that to her orders today. 01/26/2019 on evaluation today patient actually appears to be doing much better with regard to her right lower extremity. Her left lower extremity is also doing quite well all things considering. Fortunately there is no evidence of active infection at this time. No fevers, chills, nausea, vomiting, or diarrhea. 02/02/2019 on evaluation today patient appears to be doing much better compared to her last evaluation. Little by little off like her right leg is returning more towards normal. There does not appear to be any signs of active infection and overall she seems to be doing quite well which is great news. I am very pleased in this  regard. No fevers, chills, nausea, vomiting, or diarrhea. 02/09/2019 upon evaluation today patient appears to be doing better with regard to her bilateral lower extremities. She has been tolerating the dressing changes without complication. Fortunately there is no signs of active infection at this time. No fevers, chills, nausea, vomiting, or diarrhea. 02/23/2019 on evaluation today patient actually appears to be doing quite well with regard to her bilateral lower extremities. She has been tolerating the dressing changes without complication. She is even used her pumps one time and states that she really felt like it felt good. With that being said she seems to be in good spirits and her legs appear to be doing excellent. 03/09/2019 on evaluation today patient appears to be doing well with regard to her right lower extremity there are no open wounds at this time she is having some discomfort but I feel like this is more neuropathy than anything. With regard to her left lower extremity she had several areas scattered around that she does have some weeping and drainage from but again overall she does not appear to be having any  significant issues and no evidence of infection at this time which is good news. 03/23/2019 on evaluation today patient appears to be doing well with regard to her right lower extremity which she tells me is still close she is using her juxta light here. Her left lower extremity she mainly just has an area on the foot which is still slightly draining although this also is doing great. Overall very pleased at this time. 04/06/2019 patient appears to be doing a little bit worse in regard to her left lower extremity upon evaluation today. She feels like this could be becoming infected again which she had issues with previous. Fortunately there is no signs of systemic infection but again this is always a struggle with her with her legs she will go from doing well to not so well in a very short amount of time. 04/20/2019 on evaluation today patient actually appears to be doing quite well with regard to her right lower extremity I do not see any signs of active infection at this time. Fortunately there is no fever chills noted. She is still taking the antibiotics which I prescribed for her at this point. In regard to the left lower extremity I do feel like some of these areas are better although again she still is having weeping from several locations at this time. 04/27/2019 on evaluation today patient appears to be doing about the same if not slightly worse in regard to her left lower extremity ulcers. She tells me when questioned that she has been sleeping in her Hoveround chair in fact she tells me she falls asleep without even knowing it. I think she is spending a whole lot of time in the chair and less time walking and moving around which is not good for her legs either. On top of that she is in a seated position which is also the worst position she is not really elevating her legs and she is also not using her lymphedema pumps. All this is good to contribute to worsening of her condition in  general. 05/18/2019 on evaluation today patient appears to be doing well with regard to her lower extremity on the right in fact this is showing no signs of any open wounds at this time. On the left she is continuing to have issues with areas that do drain. Some of the regions have healed and there are  couple areas that have reopened. She did go to the ER per the patient according to recommendations from the home health nurse due to what she was seen when she came out on 05/13/2019. Subsequently she felt like the patient needed to go to the hospital due to the fact that again she was having "milky white discharge" from her leg. Nonetheless she had and then was placed on doxycycline and subsequently seems to be doing better. 06/01/2019 upon evaluation today patient appears to be doing really in my opinion about the same. I do not see any signs of active infection which is good news. Overall she still has wounds over the bilateral lower extremities she has reopened on the right but this appears to be more of a crack where there is weeping/edema coming from the region. I do not see any evidence of infection at either site based on what I visualized today. 07/13/2019 upon evaluation today patient appears to be doing a little worse compared to last time I saw her. She since has been in the hospital from 06/21/2019 through 06/29/2019. This was secondary to having Covid. During that time they did apply lotion to her legs which unfortunately has caused her to develop a myriad of open wounds on her lower extremities. Her legs do appear to be doing better as far as the overall appearance is concerned but nonetheless she does have more open and weeping areas. 07/27/2019 upon evaluation today patient appears to be doing more poorly to be honest in regard to her left lower extremity in particular. There is no signs of systemic infection although I do believe she may have local infection. She notes she has been having  a lot of blue/green drainage which is consistent potentially with Pseudomonas. That may be something that we need to consider here as well. The doxycycline does not seem to have been helping. 08/03/2019 upon evaluation today patient appears to be doing a little better in my opinion compared to last week's evaluation. Her culture I did review today and she is on appropriate medications to help treat the Enterobacter that was noted. Overall I feel like that is good news. With that being said she is unfortunately continuing to have a lot of drainage and though it is doing better I still think she has a long ways to go to get things dried up in general. Fortunately there is no signs of systemic infection. 08/10/2019 upon evaluation today patient appears to be doing may be slightly better in regard to her left lower extremity the right lower extremity is doing much better. Fortunately there is no signs of infection right now which is good news. No fevers, chills, nausea, vomiting, or diarrhea. 08/24/2019 on evaluation today patient appears to be doing slightly better in regard to her lower extremities. The left lower extremity seems to be healed the right lower extremity is doing better though not completely healed as far as the openings are concerned. She has some generalized issues here with edema and weeping secondary to her lymphedema though again I do believe this is little bit drier compared to prior weeks evaluations. In general I am very pleased with how things seem to be progressing. No fevers, chills, nausea, vomiting, or diarrhea. 08/31/2019 upon evaluation today patient actually seems to making some progress here with regard to the left lower extremity in particular. She has been tolerating the dressing changes without complication. Fortunately there is no signs of active infection at this time. No fevers, chills, nausea,  vomiting, or diarrhea. She did see Dr. Doren Custard and he did note that she did have  a issue with the left great saphenous vein and the small saphenous vein in the leg. With that being said he was concerned about the possibility of laser ablation not being extremely successful. He also mentioned a small risk of DVT associated with the procedure. However if the wounds do not continue to improve he stated that that would probably be the way to go. Fortunately the patient's legs do seem to be doing much better. 09/07/2019 upon evaluation today patient appears to be doing better with regard to her lower extremities. She has been tolerating the dressing changes without complication. With that being said she is showing signs of improvement and overall very pleased. There are some areas on her leg that I think we do need to debride we discussed this last week the patient is in agreement with doing that as long as it does not hurt too badly. 09/14/2019 upon evaluation today patient appears to be doing decently well with regard to her left lower extremity. She is not having near as much weeping as she has had in the past things seem to be drying up which is good news. There is no signs of active infection at this time. 09/21/19 upon evaluation today patient appears to be doing better in regard overall to her bilateral lower extremities. She again has less open than she did previous and each week I feel like this is getting better. Fortunately there is no signs of active infection at this time. No fevers, chills, nausea, vomiting, or diarrhea. 09/28/2019 upon evaluation today patient actually appears to be showing signs of improvement with regard to her left lower extremity. Unfortunately the right medial lower extremity around the ankle region has reopened to some degree but this appears to be minimal still which is good news. There is no signs of active infection at this time which is also good news. 10/12/2019 upon evaluation today patient appears to be doing okay with regard to her bilateral  lower extremities today. The right is a little bit worse then last evaluation 2 weeks ago. The left is actually doing a little better in my opinion. Overall there is no signs of active infection at this time that I see. Obviously that something we have to keep a close eye on she is very prone to this with the significant and multiple openings that she has over the bilateral lower extremities. 10/19/2019 upon evaluation today patient appears to be doing about the best that I have seen her in quite some time. She has been tolerating the dressing changes without complication. There does not appear to be any signs of active infection and overall I am extremely happy with the way her legs appeared. She is drying up quite nicely and overall is having less pain. 11/09/2019 upon evaluation today patient appears to be doing better in regard to her wounds. She seems to be drying up more and more each time I see her this is just taking a very long time. Fortunately there is no signs of active infection at this time. 11/23/2019 upon evaluation today patient actually appears to be doing excellent in regard to her lower extremities at this point compared to where she has been. Fortunately there is no signs of active infection at this time. She did go to the hospital last week for nausea and vomiting completely unrelated to her wounds. Fortunately she is doing better she was  given some Reglan and got better. She had associated abdominal pain but they never found out what was going on. 12/07/2019 upon evaluation today patient appears to be doing well for the most part in regard to her legs. She unfortunately has not been keeping the [place] portion of her wraps on therefore the compression has not really been sufficient for what it is supposed to be. Nonetheless she tells me that it just hurt too bad therefore she removed it. 12/21/2019 upon evaluation today patient actually appears to be doing quite well with regard to her  legs. I do feel like she has been making progress which is great news and overall there is no signs of active infection at this time. No fevers, chills, nausea, vomiting, or diarrhea. 01/04/2020 upon evaluation today patient presents for follow-up concerning her lower extremity edema bilaterally. She still has open wounds she has not been using her lymphedema pumps. She is also not been utilizing her compression wraps appropriately she tends to unwrap them, take them off, or states that they hurt. Obviously the reason they hurt is because her legs start to swell but the issue is if she would use her compression/lymphedema pumps regularly she would not swell and she would have the pain. Nonetheless she has not even picked them up once honestly over the past several months and may be even as much as in the past year based on my opinion and what have seen. She tells me today that after last week when I talked about this with her specifically actually that was 2 weeks ago that she "forgot". 8//21 on evaluation today patient appears to be doing a little better in regard to her legs bilaterally. Fortunately there is no signs of active infection at this time. She tells me that she used her lymphedema pumps all of one time over the past 2 weeks since I last saw her. She tells me that she has been too busy in order to continue to use these. 02/01/2020 on evaluation today patient appears to be doing some better in regard to her wounds in general in her legs. We felt the right was healed although is not completely it does appear to be doing better she tells me she has been using her lymphedema pumps that she has had this six times since I last saw her. Obviously the more she does that the better she would do my opinion 02/15/2020 upon evaluation today patient appears to be doing about the same in regard to her legs. She tells me that she is pumping I'm still not sure how much she does to be perfectly honest. However  even if she does a little bit here and there I guess that is better than nothing. Fortunately there is no sign of active infection at this time which is great news. No fevers, chills, nausea, vomiting, or diarrhea. 02/29/2020 on evaluation today patient actually appears to be doing quite well all things considered this week. She has been tolerating the dressing changes without complication. Fortunately there is no signs of active infection at this time. No fevers, chills, nausea, vomiting, or diarrhea. 03/14/2020 upon evaluation today patient appears to be doing really about the same in regard to her legs. There is no signs of improvement overall and she as noted from home health does not appear to be elevating her legs he can get into her lift chair. There is too much stuff piled up on it the patient tells me. She also tells me  she cannot really use her pumps effectively due to the fact that she cannot have any space to get them on. Finally she is also not really elevating her legs because she is not sleeping in her bed she is sleeping in her chair currently and again overall I think everything that she is done in combination has been exactly the wrong thing for what she needs for her legs. 04/04/2020 upon evaluation today patient appears to be doing well at this time with regard to her legs. She is actually been pumping, keeping her wraps on, and to be honest she seems to be doing dramatically better the right leg is excellent the left leg is also excellent and measuring much smaller than previous. 04/18/2020 upon evaluation today patient actually is continue to make good progress in regard to her lower extremities bilaterally. Everything is improving and less wet that has been in the past overall I am extremely pleased with where things stand and I think that she is making great progress. The patient tells me she still continue to use her compression pumps 05/02/2020 on evaluation today patient appears  to be doing well at this time in regard to her left leg which is showing signs of drying up. With that being said she does have a lot of lymphedema type crusty skin around the toes of her left foot and the right medial ankle which has opened at this point. Fortunately there is no signs of active infection systemically at this point or even locally for that matter. 05/23/2020 on evaluation today patient appears to be doing well with regard to her lower extremities. Fortunately there is no signs of active infection at this time. No fever chills noted. She has been very depressed however she tells me. 06/06/2020 patient came in today for evaluation in regard to her bilateral lower extremity ulcerations. With that being said she came in feeling okay and actually laughing and joking around with the staff checking her in. Subsequently however she had a coughing spell and following the coughing spell it was a dramatic conversion from being jovial and joking around to being extremely short of breath her vital signs actually dropped in regard to her blood pressure from around 175 to down around 563 for systolic and from around 96 diastolic down to around 70. With that being said she also accompanied this with an increase in her respiratory rate which was also quite significant. Nonetheless I actually upon going into see her was extremely worried we called EMS to have her transported to the ER for further evaluation and treatment. She continued until EMS got here to be extremely short of breath even on 6 L of oxygen. Her oxygen saturation did come up to 99% but overall it was only 95 before which was not terrible she did feel like the oxygen helped her feel somewhat better however. She has a history of asthma but again even the coughing spell really should not have done this degree of alteration in her demeanor from where she was just before to after the coughing spell. She tells [place] however she has been feeling  somewhat abnormal since Friday although she could not really pinpoint exactly what was going on. 06/27/2020 plan evaluation today patient appears to be doing okay in regard to her leg ulcers. She is really not showing a lot of improvement to be honest and she still has a lot of weeping. With that being said after I last saw her we called EMS to  take her to the hospital she actually did not go she went to see her primary care provider who according to the patient have not identified and read through the note states that everything checked out okay. Nonetheless the patient has continued to have bouts where she gets very short of breath for seemingly no reason whatsoever. That happened even the same night that I saw her last time. This was after she refused transport to the hospital. With that being said she also tells me that just getting dressed to come here is quite a chore which is putting on her shirt causing her to have to stop to catch her breath. Obviously this is not normal and I feel like there is something going on. She may need a referral to be seen by cardiology ASAP. She does see her primary care provider early next week she tells me 07/11/2020 upon evaluation today patient's wounds again appear to be doing about the same she mainly has weeping of the bilateral lower extremities both are very swollen today she tells me she has been using her pumps but last time I saw her she told me she did not even have her pumps out that she had "packed them away because they had too much stuff in their house. Apparently her husband has been bringing stuff in from storage units. She then subsequently told me that she had so much stuff in her house that she has been staying up all night and did not go to bed till 7:00 this morning because she was cleaning up stuff. Nonetheless I am still unsure as to whether or not she is using the pumps based on what I am seeing I would think probably not. Nonetheless she  has been keeping the compression wraps in place that is at least something 07/25/2020 upon evaluation today patient appears to be doing well currently in regard to her legs all things considered. I do not think she is doing any worse significantly although honestly I do not think she is doing a lot better either. There does not appear to be any signs of infection which is good news although she does have a lot of weeping. She is using her pumps she tells me sporadically though she says that her husband is not a big fan of helping her to get the [place]. She also tells me is difficult because in their house. It would be easiest for her to use these as where her son is actually staying with her and living out at this point. Obviously I understand that she is having a lot of issues here and to be honest I do not really know what to say in that regard and the fact that I really cannot change the situations but I do believe she really needs to be using the lymphedema pumps to try to keep things under control here. Outside of that I think she is going to continue to have significant issues. I think the pumps coupled with good compression and elevation are the only things that are to help her at this point. 08/08/2020 upon evaluation today patient appears to be doing a little worse in regard to her legs in general. I think this may be due to some infection currently. With that being said I am going to go ahead and likely see about putting her on an antibiotic. Were also can obtain a culture today where she had some purulent drainage. 08/29/2020 upon evaluation today patient appears to be doing  about the same in regard to her bilateral lower extremities. Unfortunately she is continuing to have significant issues here with edema and she has significant lymphedema. With that being said she is really not doing anything that she is supposed to be doing as far as elevation, compression, using lymphedema pumps or  anything really for that matter. She does not use her pumps, is taken the wraps off shortly after having them put on as far as home health is concerned at least by the next day she tells me, and overall is not really elevating her legs she is telling me she has a lot to do as far as cleaning up in her house. Nonetheless this triad of noncompliance is leading to worsening not improving in regard to her legs. I had a very strong conversation with her about this today again. 09/12/2020 on evaluation today patient appears to be doing poorly with regard to her lower extremities bilaterally. Fortunately there does not appear to be any signs of active infection at this point. That is systemically. Locally there is some signs of pus coming from an area on her left leg. She has been on antibiotics recently though she is done with those currently. Nonetheless she tells me she has been having increased pain with the right leg this is extremely swollen as well. Fortunately there does not appear to be significant infection of the right leg though I think her pain is really as result of the increased swelling she has been declining the wrap on the right leg and the wounds here are getting significantly worse week by week. This obviously is not will be want to see. I discussed with the patient however if she does not use the wraps, use her pumps, and elevate her legs that she is not to get better. She has been sleeping in her motorized Hoveround which does not even allow her to elevate her legs at all. She tells me that she is also constantly "cleaning up as her husband told her that he wants to move back to [place]." Unfortunately this has been the narrative for the past several months in fact that she tells me that she has been having to clean up mass that was moved in from storage units into their home and that she does not really sleep and she never really elevates her legs. I explained to the patient today  that if she is not can I do any of this that there is really nothing that I can do to help her. 09/26/20 upon evaluation today patient appears to be doing about the same in regard to her wounds. Fortunately there is no signs of active infection at this time. No fever chills noted. She has been tolerating the dressing changes without complication which is good news. With that being said I do think that she unfortunately is still having a lot of pain but I think this is due to the swelling that really is not controlled and as I discussed with her last time she needs to be using her lymphedema pumps there is still in the box she has not even attempted to use those whatsoever. I am also not even certain she is taking her fluid pills as she states she may need to have "get a refill" she was seen with her husband at the appointment today. 10/24/2020 upon evaluation today patient appears to be doing decently well in regard to her legs all things considered. She has been tolerating the dressing  changes without complication. Fortunately there is no signs of active infection at this time. No fevers, chills, nausea, vomiting, or diarrhea. With that being said the patient does appear to have some excessive thickened skin buildup currently that has been require some sharp debridement to clear this away. I am hopeful if we do this we will be able to get some of this area to dry out this otherwise trapping fluid underneath. Her son is present during the office visit today. She tells me she still been doing a lot as far as cleaning up and going through boxes she tells me she has been up for a long time already this morning. 5/25; patient presents for 2-week follow-up. She had 3 layer compression wrap placed with calcium alginate underneath. She reports tolerating the wraps well. She has home health that changes the wrap 2-3 times a week. She denies signs of infection. She has almost finished her course of antibiotics. She  uses her lymphedema pumps once weekly. 11/21/2020 upon evaluation today patient appears to be doing well with regard to her legs all things considered. Fortunately there is no signs of active infection at this time which is great news I do not see any evidence of infection and overall I think that she is definitely improved compared to where things did previous. Nonetheless I do think that she may benefit from removing some of the thicker skin on the legs. Again we have attempted this in the past to some degree but I think that we might be able to more effectively do this of a clear some way and then potentially use a urea cream with salicylic acid to try to help clear some of this away. The patient is in agreement with the plan. 12/05/2020 upon evaluation today patient appears to be doing well with regard to her wounds in general. I feel like that things are somewhat improved the skin looking a little better. With that being said she still has significant lymphedema. She is not been using her lymphedema pumps and not really been elevating her legs. She also did have some maggots noted on the left leg she tells me that the "gnats are terrible and get all over her foot." With that being said I am concerned about the fact that the patient still is quite swollen even with the compression wraps I really feel like she needs to be using her pumps but I cannot talk her into doing this. 12/19/2020 upon evaluation today patient appears to be doing about the same in regard to her legs. Fortunately I do not see any signs of infection unfortunately she really seems to be very nonchalant about the fact that she has not been using her lymphedema pumps. She tells me that "I just forget". With that being said this needs to be something that is a priority for her I cannot count the number of times that have had this discussion with her and yet nothing seems to change. With that being said I think that is very unlikely she  is ever really getting healed with regard to her wounds to be honest she does not seem to be motivated to do anything to try to help at this point despite everything that I have tried to do and recommend for her. 01/02/2021 upon evaluation today patient actually appears to be doing excellent in regard to her wounds. She has been tolerating the dressing changes without complication. Fortunately there does not appear to be any signs of active infection  at this time which is great news. No fevers, chills, nausea, vomiting, or diarrhea. 01/23/2021 upon evaluation today patient appears to be doing worse in general in regard to her wounds. This is due to the fact that she is not been using her compression pumps which again she knows when she does not it always turns out poorly for her. Unfortunately the issue here today is that she is continuing to have troubles with her son she seems to be very depressed and I am truly sorry for her and everything she is going through. With that being said I do not know what to do to make things better if she want elevate her legs and use her lymphedema pumps there is really not have anything I can do to correct or fix the situation for her to be honest. I discussed all this with her today. 02/20/2021 upon evaluation today the patient unfortunately has been in the hospital due to cellulitis. Again this is something that is been an ongoing issue based on what I am seeing I think that she unfortunately still is having trouble complying with anything that we recommend. She has not been sleeping in her bed she tells me that she continues sleeping really in her motorized wheelchair that sweats been the case for the past several visits that have seen her despite me counseling to the opposite that she should be trying to sleep in her bed where she could get her legs off the ground and allow some of the swelling edema to come down. Subsequently also it appears that someone not sure his  primary care otherwise has given orders for her to not be wrapped and subsequently to be washing her legs daily which she is not doing. This was prior to her going into the hospital. She also tells me she is not been using her lymphedema pumps even before the infection setting and she went to the hospital. She also tells me that she does not always take her fluid pills and again the compression wraps have not been in place. Pretty much she has not done anything that I recommended up to this point. Again I had this conversation with her multiple times and unfortunately just does not seem to be sinking in that she really needs to be taking care of herself better. She is extremely noncompliant. 03/13/2021 upon evaluation today patient appears to be doing poorly still in regard to her legs. She is still not using any of the compression wrapping at this time on a regular basis and that she is taken it off. I discussed with her that she needs to not be doing this that that is definitely something that is going to cause her long-term trouble and definitely not help her to heal. She is also not use her lymphedema pumps but 1 time since last time I saw her 2 weeks ago based on what I am hearing. Nonetheless my issue here is simply that compliance is a big factor I think in her not getting better and this continues to be a problem. I think that I am very close to having a discussion with her that we may be discharging her due to noncompliance if she does not step things up. Electronic Signature(s) Signed: 03/15/2021 4:17:51 PM By: [name] PA-C Entered By: [name] on 03/15/2021 16:17:51 -------------------------------------------------------------------------------- Physical Exam Details Patient Name: Date of Service: MCKAYLA, MULCAHEY 03/13/2021 1:00 PM Medical Record Number: [id number] Patient Account Number: [id number] Date of Birth/Sex:  Treating RN: 1950/10/26 (70 y.o. [name] Primary Care Provider: [name] Other Clinician: Referring Provider: Treating Provider/Extender: Sallye Ober, Whitney Weeks in Treatment: 188 Constitutional Obese and well-hydrated in no acute distress. Respiratory normal breathing without difficulty. Psychiatric this patient is able to make decisions and demonstrates good insight into disease process. Alert and Oriented x 3. pleasant and cooperative. Notes Upon inspection patient's wound bed actually showed signs of significant weeping and edema I do not see any obvious signs of infection but in general I do not think she is doing nearly as good as she has been in the past. Obviously when she is done well is always been when she was actually using her pumps, keeping the wraps on, and elevating her legs but these kinds of been few and far between. Electronic Signature(s) Signed: 03/15/2021 4:18:15 PM By: [name] PA-C Entered By: [name] on 03/15/2021 16:18:15 -------------------------------------------------------------------------------- Physician Orders Details Patient Name: Date of Service: [name]. 03/13/2021 1:00 PM Medical Record Number: [id number] Patient Account Number: [id number] Date of Birth/Sex: Treating RN: 09/26/1950 (70 y.o. [name] Primary Care Provider: [name] Other Clinician: Referring Provider: Treating Provider/Extender: Melchor Amour Weeks in Treatment: [phone] Verbal / Phone Orders: No Diagnosis Coding ICD-10 Coding Code Description E11.622 Type 2 diabetes mellitus with other skin ulcer I89.0 Lymphedema, not elsewhere classified I87.331 Chronic venous hypertension (idiopathic) with ulcer and inflammation of right lower extremity I87.332 Chronic venous hypertension (idiopathic) with ulcer and inflammation of left lower extremity L97.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 leg with fat layer exposed L97.822  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 I10 Essential (primary) hypertension E66.01 Morbid (severe) obesity due to excess calories F41.8 Other specified anxiety disorders R53.1 Weakness Follow-up Appointments ppointment in 1 week. - with Margarita Grizzle Return A Bathing/ Shower/ Hygiene May shower and wash wound with soap and water. - with dressing changes, wash both legs with wash cloth and soap and water with dressing changes Other Bathing/Shower/Hygiene Orders/Instructions: - Home Health to wash legs with warm soapy water and a clean wash cloth with dressing changes Edema Control - Lymphedema / SCD / Other Bilateral Lower Extremities Lymphedema Pumps. Use Lymphedema pumps on leg(s) 2-3 times a day for 45-60 minutes. If wearing any wraps or hose, do not remove them. Continue exercising as instructed. Elevate legs to the level of the heart or above for 30 minutes daily and/or when sitting, a frequency of: - especially at night Avoid standing for long periods of time. Exercise regularly [place] wound care orders this week; continue Home Health for wound care. May utilize formulary equivalent dressing for wound treatment orders unless otherwise specified. - Primary dressing changed to Hydrofera Blue. Wash legs with warm soapy water and wash cloth with dressing changes Dressing changes to be completed by [place] on Monday / Wednesday / Friday except when patient has scheduled visit at [facility]. Other Home Health Orders/Instructions: - [facility] Wound Treatment Wound #61 - Lower Leg Wound Laterality: Left, Circumferential Cleanser: Soap and Water 3 x Per Week/30 Days Discharge Instructions: May shower and wash wound with dial antibacterial soap and water prior to dressing change. Peri-Wound Care: Sween Lotion (Moisturizing lotion) (Home Health) 3 x Per  Week/30 Days Discharge Instructions: Apply moisturizing lotion to dry skin on legs Prim  Dressing: Hydrofera Blue Ready Foam, 4x5 in 3 x Per Week/30 Days ary Discharge Instructions: Apply to wound bed as instructed Secondary Dressing: ABD Pad, 8x10 (Home Health) 3 x Per Week/30 Days Discharge Instructions: Apply over primary dressing as directed. Secondary Dressing: Zetuvit Plus 4x4 in (Home Health) 3 x Per Week/30 Days Discharge Instructions: or equivalent extra absorbent pad.Apply over primary dressing as needed. Compression Wrap: ThreePress (3 layer compression wrap) (Home Health) 3 x Per Week/30 Days Discharge Instructions: Apply three layer compression as directed. Pad bend of ankle with foam or ABD pad. Wound #64 - Foot Wound Laterality: Dorsal, Left Cleanser: Soap and Water 3 x Per Week/30 Days Discharge Instructions: May shower and wash wound with dial antibacterial soap and water prior to dressing change. Peri-Wound Care: Sween Lotion (Moisturizing lotion) (Home Health) 3 x Per Week/30 Days Discharge Instructions: Apply moisturizing lotion to dry skin on legs Prim Dressing: Hydrofera Blue Ready Foam, 4x5 in 3 x Per Week/30 Days ary Discharge Instructions: Apply to wound bed as instructed Secondary Dressing: ABD Pad, 8x10 (Home Health) 3 x Per Week/30 Days Discharge Instructions: Apply over primary dressing as directed. Secondary Dressing: Zetuvit Plus 4x4 in (Home Health) 3 x Per Week/30 Days Discharge Instructions: or equivalent extra absorbent pad.Apply over primary dressing as needed. Compression Wrap: ThreePress (3 layer compression wrap) (Home Health) 3 x Per Week/30 Days Discharge Instructions: Apply three layer compression as directed. Pad bend of ankle with foam or ABD pad. Wound #67 - Lower Leg Wound Laterality: Right, Medial Cleanser: Soap and Water 3 x Per Week/30 Days Discharge Instructions: May shower and wash wound with dial antibacterial soap and water prior to  dressing change. Peri-Wound Care: Sween Lotion (Moisturizing lotion) (Home Health) 3 x Per Week/30 Days Discharge Instructions: Apply moisturizing lotion to dry skin on legs Prim Dressing: Hydrofera Blue Ready Foam, 4x5 in 3 x Per Week/30 Days ary Discharge Instructions: Apply to wound bed as instructed Secondary Dressing: ABD Pad, 8x10 (Home Health) 3 x Per Week/30 Days Discharge Instructions: Apply over primary dressing as directed. Secondary Dressing: Zetuvit Plus 4x4 in (Home Health) 3 x Per Week/30 Days Discharge Instructions: or equivalent extra absorbent pad.Apply over primary dressing as needed. Compression Wrap: ThreePress (3 layer compression wrap) (Home Health) 3 x Per Week/30 Days Discharge Instructions: Apply three layer compression as directed. Pad bend of ankle with foam or ABD pad. Wound #71 - Lower Leg Wound Laterality: Right, Posterior Cleanser: Soap and Water 3 x Per Week/30 Days Discharge Instructions: May shower and wash wound with dial antibacterial soap and water prior to dressing change. Peri-Wound Care: Sween Lotion (Moisturizing lotion) (Home Health) 3 x Per Week/30 Days Discharge Instructions: Apply moisturizing lotion to dry skin on legs Prim Dressing: Hydrofera Blue Ready Foam, 4x5 in 3 x Per Week/30 Days ary Discharge Instructions: Apply to wound bed as instructed Secondary Dressing: ABD Pad, 8x10 (Home Health) 3 x Per Week/30 Days Discharge Instructions: Apply over primary dressing as directed. Secondary Dressing: Zetuvit Plus 4x4 in (Home Health) 3 x Per Week/30 Days Discharge Instructions: or equivalent extra absorbent pad.Apply over primary dressing as needed. Compression Wrap: ThreePress (3 layer compression wrap) (Home Health) 3 x Per Week/30 Days Discharge Instructions: Apply three layer compression as directed. Pad bend of ankle with foam or ABD pad. Wound #72 - Foot Wound Laterality: Dorsal, Right Cleanser: Soap and Water 3 x Per Week/30  Days Discharge Instructions: May shower and wash wound with dial antibacterial soap and water prior to dressing change.  Peri-Wound Care: Sween Lotion (Moisturizing lotion) (Home Health) 3 x Per Week/30 Days Discharge Instructions: Apply moisturizing lotion to dry skin on legs Prim Dressing: Hydrofera Blue Ready Foam, 4x5 in 3 x Per Week/30 Days ary Discharge Instructions: Apply to wound bed as instructed Secondary Dressing: ABD Pad, 8x10 (Home Health) 3 x Per Week/30 Days Discharge Instructions: Apply over primary dressing as directed. Secondary Dressing: Zetuvit Plus 4x4 in (Home Health) 3 x Per Week/30 Days Discharge Instructions: or equivalent extra absorbent pad.Apply over primary dressing as needed. Compression Wrap: ThreePress (3 layer compression wrap) (Home Health) 3 x Per Week/30 Days Discharge Instructions: Apply three layer compression as directed. Pad bend of ankle with foam or ABD pad. Electronic Signature(s) Signed: 03/13/2021 5:34:22 PM By: Lorrin Jackson Signed: 03/15/2021 4:42:31 PM By: [name] PA-C Entered By: Lorrin Jackson on 03/13/2021 14:13:02 -------------------------------------------------------------------------------- Problem List Details Patient Name: Date of Service: [name]. 03/13/2021 1:00 PM Medical Record Number: [id number] Patient Account Number: [id number] Date of Birth/Sex: Treating RN: Jul 19, 1950 (70 y.o. [name] Primary Care Provider: [name] Other Clinician: Referring Provider: Treating Provider/Extender: Melchor Amour Weeks in Treatment: [phone] Active Problems ICD-10 Encounter Code Description Active Date MDM Diagnosis E11.622 Type 2 diabetes mellitus with other skin ulcer 08/05/2017 No Yes I89.0 Lymphedema, not elsewhere classified 08/05/2017 No Yes I87.331 Chronic venous hypertension (idiopathic) with ulcer and inflammation of right 08/05/2017 No Yes lower extremity I87.332 Chronic venous  hypertension (idiopathic) with ulcer and inflammation of left 08/05/2017 No Yes lower extremity L97.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 leg with fat layer 08/05/2017 No Yes exposed L97.822 Non-pressure chronic ulcer of other part of left lower leg with fat layer exposed2/20/2019 No Yes L97.522 Non-pressure chronic ulcer of other part of left foot with fat layer exposed 06/01/2019 No Yes I10 Essential (primary) hypertension 08/05/2017 No Yes E66.01 Morbid (severe) obesity due to excess calories 08/05/2017 No Yes F41.8 Other specified anxiety disorders 08/05/2017 No Yes R53.1 Weakness 08/05/2017 No Yes Inactive Problems Resolved Problems Electronic Signature(s) Signed: 03/13/2021 5:34:22 PM By: Lorrin Jackson Signed: 03/15/2021 4:42:31 PM By: [name] PA-C Previous Signature: 03/13/2021 1:36:37 PM Version By: [name] PA-C Entered By: Lorrin Jackson on 03/13/2021 13:40:21 -------------------------------------------------------------------------------- Progress Note Details Patient Name: Date of Service: [name]. 03/13/2021 1:00 PM Medical Record Number: [id number] Patient Account Number: [id number] Date of Birth/Sex: Treating RN: April 09, 1951 (70 y.o. [name] Primary Care Provider: [name] Other Clinician: Referring Provider: Treating Provider/Extender: Melchor Amour Weeks in Treatment: [phone] Subjective Chief Complaint Information obtained from Patient Bilateral reoccurring LE ulcers History of Present Illness (HPI) this patient has been seen a couple of times before and returns with recurrent problems to her right and left lower extremity with swelling and weeping ulcerations due to not wearing her compression stockings which she had been advised to do during her last discharge, at the end of June 2018. During her last visit the patient had had normal arterial blood flow and her venous reflux study did not  necessitate any surgical intervention. She was recommended compression and elevation and wound care. After prolonged treatment the patient was completely healed but she has been noncompliant with wearing or compressions.. She was here last week with an outpatient return visit planned but the patient came in a very poor general condition with altered mental status and was rushed to the ER on my request. With a history of hypertension, diabetes, TIA and right-sided  weakness she was set up for an MRI on her brain and cervical spine and was sent to [facility]. Getting an MRI done was very difficult but once the workup was done she was found not to have any spinal stenosis, epidural abscess or hematoma or discitis. This was radiculopathy to be treated as an outpatient and she was given a follow-up appointment. Today she is feeling much better alert and oriented and has come to reevaluate her bilateral lower extremity lymphedema and ulceration 03/25/2017 -- she was admitted to the hospital on 03/16/2017 and discharged on 03/18/2017 with left leg cellulitis and ulceration. She was started on vancomycin and Zosyn and x-ray showed no bony involvement. She was treated for a cellulitis with IV antibiotics changed to Rocephin and Flagyl and was discharged on oral Keflex and doxycycline to complete a 7 day course. Last hemoglobin A1c was 7.1 and her other ailments including hypertension got asthma were appropriately treated. 05/06/2017 -- she is awaiting the right size of compression stockings from [place] but other than that has been doing well. ====== Old notes 70 year old patient was seen one time last October and was lost to follow-up. She has recurrent problems with weeping and ulceration of her left lower extremity and has swelling of this for several years. It has been worse for the last 2 months. Past medical history is significant for diabetes mellitus type 2, hypertension, gout, morbid obesity,  depressive disorders, hiatal hernia, migraines, status post knee surgery, risk of a cholecystectomy, vaginal hysterectomy and breast biopsy. She is not a smoker. As noted before she has never had a venous duplex study and an arterial ABI study was attempted but the left lower extremity was noncompressible 10/01/2016 -- had a lower extremity venous duplex reflux evaluation which showed no evidence of deep vein reflux in the right or left lower extremity, and no evidence of great saphenous vein reflux more than 500 ms in the right or left lower extremity, and the left small saphenous vein is incompetent but no vascular consult was recommended. review of her electronic medical records noted that the ABI was checked in July 2017 where the right ABI was normal limits and the left ABI could not be ascertained due to pain with cuff pressure but the waveforms are within normal limits. her arterial duplex study scheduled for April 27. 10/08/2016 -- the patient has various reasons for not having a compression on and for the last 3 days she has had no compression on her left lower extremity either due to pain or the lack of nursing help. She does not use her juxta lites either. 10/15/2016 -- the patient did not keep her appointment for arterial duplex study on April 27 and I have asked her to reschedule this. Her pain is out of proportion with the physical findings and she continuously fails to wear a compression wraps and cuts them off because she says she cannot tolerate the pain. She does not use her juxta lites either. 10/22/2016 -- he has rescheduled her arterial duplex study to May 21 and her pain today is a bit better. She has not been wearing her juxta lites on her right lower extremity but now understands that she needs to do this. She did tolerate the to press compression wrap on her left lower extremity 10/29/2016 --arterial duplex study is scheduled for next week and overall she has been tolerating  her compression wraps and also using her juxta lites on her right lower extremity 11/05/2016 -- the right  ABI was 0.95 the left was 1.03. The digit TBI is on the right was 0.83 on the left was 0.92 and she had biphasic flow through these vessels. The impression was that of normal lower extremity arterial study. 11/12/2016 -- her pain is minimal and she is doing very well overall. 11/26/2016 -- she has got juxta lites and her insurance will not pay for additional dual layer compression stockings. She is going to order some from [place]. 05/12/2017 -- her juxta lites are very old and too big for her and these have not been helping with compression. She did get 20-30 mm compression stockings from [place] but she and her husband are unable to put these on. I believe she will benefit from bilateral Extremit-ease, compression stockings and we will measure her for these today. 05/20/2017 -- lymphedema on the left lower extremity has increased a lot and she has a open ulceration as a result of this. The right lower extremity is looking pretty good. She has decided to by the compression stockings herself and will get reimbursed by the home health, at a later date. 05/27/2017 -- her sciatica is bothering her a lot and she thought her left leg pain was caused due to the compression wrap and hence removed it and has significant lymphedema. There is no inflammation on this left lower extremity. 06/17/17 on evaluation today patient appears to be doing very well and in fact is completely healed in regard to her ulcerations. Unfortunately however she does have continued issues with lymphedema nonetheless. We did order compression garments for her unfortunately she states that the size that she received were large although we ordered medium. Obviously this means she is not getting the optimal compression. She does not have those with her today and therefore we could not confirm and contact the company on her behalf.  Nonetheless she does state that she is going to have her husband bring them by tomorrow so that we can verify and then get in touch with the company. No fevers, chills, nausea, or vomiting noted at this time. Overall patient is doing better otherwise and I'm pleased with the progress she has made. 07/01/17 on evaluation today patient appears to be doing very well in regard to her bilateral lower extremity she does not have any openings at this point which is excellent news. Overall I'm pleased with how things have progressed up to this time. Since she is doing so well we did order her compression which we are seeing her today to ensure that it fits her properly and everything is doing well in that regard and then subsequently she will be discharged. ============ Old Notes: 03/31/16 patient presents today for evaluation concerning open wounds that she has over the left medial ankle region as well as the left dorsal foot. She has previously had this occur although it has been healed for a number of months after having this for about a year prior until her hospitalization on 01/05/16. At that point in time it appears that she was admitted to the hospital for left lower extremity cellulitis and was placed on vancomycin and Zosyn at that point. Eventually upon discharge on January 15, 2016 she was placed on doxycycline at that point in time. Later on 03/27/16 positive wound culture growing Escherichia coli this was switched to amoxicillin. Currently she tells me that she is having pain radiated to be a 7 out of 10 which can be as high as 10 out of 10 with palpation and manipulation  of the wound. This wound appears to be mainly venous in nature due to the bilateral lower extremity venous stasis/lymphedema. This is definitely much worse on her left than the right side. She does have type 1 diabetes mellitus, hypertension, morbid obesity, and is wheelchair dependent.during the course of the hospital stay a blood  culture was also obtained and fortunately appeared negative. She also had an x-ray of the tibia/fibula on the left which showed no acute bone abnormality. Her white blood cell count which was performed last on 03/25/16 was 7.3, hemoglobin 12.8, protein 7.1, albumin 3.0. Her urine culture appeared to be negative for any specific organisms. Patient did have a left lower extremity venous duplex evaluation for DVT . This did not include venous reflux studies but fortunately was negative for DVT Patient also had arterial studies performed which revealed that she had a . normal ABI on the right though this was unable to be performed on the left secondary to pain that she was having around the ankle region due to the wound. However it was stated on report that she had biphasic pulses and apparently good blood flow. ========== 06/03/17 she is here in follow-up evaluation for right lower extremity ulcer. The right lower sure he has healed but she has reopened to the left medial malleolus and dorsal foot with weeping. She is waiting for new compression garments to arrive from home health, the previous compression garments were ill fitting. We will continue with compression bilaterally and follow-up in 2 weeks Readmission: 08/05/17 on evaluation today patient appears to be doing somewhat poorly in regard to her left lower extremity especially although the right lower extremity has a small area which may no longer be open. She has been having a lot of drainage from the left lower extremity however he tells me that she has not been able to use the EXTREMIT-EASE Compression at this point. She states that she did better and was able to actually apply the Juxta-Lite compression although the wound that she has is too large and therefore really does not compress which is why she cannot wear it at this point. She has no one who can help her put it on regular basis her son can sometimes but he's not able to do it most of  the time. I do believe that's why she has begun to weave and have issues as she is currently yet again. No fevers, chills, nausea, or vomiting noted at this time. Patient is no evidence of dementia. 08/12/17 on evaluation today patient appears to still be doing fairly well in regard to the draining areas/weeping areas at this point. With that being said she unfortunately did go to the ER yesterday due to what was felt to be possibly a cellulitis. They place her on doxycycline by mouth and discharge her home. She definitely was not admitted. With that being said she states she has had more discomfort which has been unusual for her even compared to prior times and she's had infections.08/12/17 on evaluation today patient appears to still be doing fairly well in regard to the draining areas/weeping areas at this point. With that being said she unfortunately did go to the ER yesterday due to what was felt to be possibly a cellulitis. They place her on doxycycline by mouth and discharge her home. She definitely was not admitted. With that being said she states she has had more discomfort which has been unusual for her even compared to prior times and she's had infections.  08/19/17 put evaluation today patient tells me that she's been having a lot of what sounds to be neuropathic type pain in regard to her left lower extremity. She has been using over-the-counter topical bins again which some believe. That in order to apply the she actually remove the wrap we put on her last Wednesday on Thursday. Subsequently she has not had anything on compression wise since that time. The good news is a lot of the weeping areas appear to have closed at this point again I believe she would do better with compression but we are struggling to get her to actually use what she needs to at this point. No fevers, chills, nausea, or vomiting noted at this time. 09/03/17 on evaluation today patient appears to be doing okay in regard to  her lower extremities in regard to the lymphedema and weeping. Fortunately she does not seem to show any signs of infection at this point she does have a little bit of weeping occurring in the right medial malleolus area. With that being said this does not appear to be too significant which is good news. 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 She has severe skin damage secondary to both of these features involving the dorsal left foot and medial left ankle and lower leg. Still has open areas in the left anterior foot. The area on the right closed over. She uses her own juxta light stockings. She does not have an arterial issue 09/16/17 on evaluation today patient actually appears to be doing excellent in regard to her bilateral lower extremity swelling. The Juxta-Lite compression wrap seem to be doing very well for her. She has not however been using the portion that goes over her foot. Her left foot still is draining a little bit not nearly as significant as it has been in the past but still I do believe that she likely needs to utilize the full wrap including the foot portion of this will improve as well. She also has been apparently putting on a significant amount of Vaseline which also think is not helpful for her. I recommended that if she feels she needs something for moisturizer Eucerin will probably be better. 09/30/17 on evaluation today patient presents with several new open areas in regard to her left lower extremity although these appear to be minimal and mainly seem to be more moisture breakdown than anything. Fortunately she does not seem to have any evidence of infection which is great news. She has been tolerating the dressing changes without complication we are using silver alginate on the foot she has been using AB pads to have the legs and using her Juxta- Lite compression which seems to be controlling her  swelling very well. Overall I'm pleased with the poor way she has progressed. 10/14/17 on evaluation today patient appears to be doing better in regard to her left lower extremity areas of weeping. She does still have some discomfort although in general this does not appear to be as macerated and I think it is progressing nicely. I do think she still needs to wear the foot portion of her Juxta- Lite in order to get the most benefit from the wrap obviously. She states she understands. Fortunately there does not appear to be evidence of infection at this time which is great news. 10/28/17 on evaluation today patient appears to be doing excellent in regard to her left lower extremity. She has just a couple areas  that are still open and seem to be causing any trouble whatsoever. For that reason I think that she is definitely headed in the right direction the spots are very tiny compared to what we have been dealing with in the past. 11/11/17 on evaluation today patient appears to have a right lateral lower extremity ulcer that has opened since I last saw her. She states this is where the home health nurse that was coming out remove the dressing without wetting the alginate first. Nonetheless I do not know if this is indeed the case or not but more importantly we have not ordered home help to be coming out for her wounds at all. I'm unsure as to why they are coming out and we're gonna have to check on this and get things situated in that regard. With that being said we currently really do not need them to be coming out as the patient has been taking care of her leg herself without complication and no issues. In fact she was doing much better prior to nursing coming out. 11/25/17 on evaluation today patient actually appears to be doing fairly well in regard to her left lower extremity swelling. In fact she has very little area of weeping at this point there's just a small spot on the lateral portion of her right  leg that still has me just a little bit more concerned as far as wanting to see this clear up before I discharge her to caring for this at home. Nonetheless overall she has made excellent progress. 12/09/17 on evaluation today patient appears to be doing rather well in regard to her lower extremity edema. She does have some weeping still in the left lower extremity although the big area we were taking care of two weeks ago actually has closed and she has another area of weeping on the left lower extremity immediately as well is the top of her foot. She does not currently have lymphedema pumps she has been wearing her compression daily on a regular basis as directed. With that being said I think she may benefit from lymphedema pumps. 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 We had a very difficult time getting and keeping this under control. 12/23/17 on evaluation today patient actually appears to be doing a little bit more poorly in regard to her bilateral lower extremities. She has been tolerating the Juxta-Lite compression wraps. Unfortunately she has two new ulcers on the right lower extremity and left lower Trinity ulceration seems to be larger. Obviously this is not good news. She has been tolerating the dressings without complication. 12/30/17 on evaluation today patient actually appears to be doing much better in regard to her bilateral lower extremity edema. She continues to have some issues with ulcerations and in fact there appears to be one spot on each leg where the wrap may have caused a little bit of a blister which is subsequently opened up at this point is given her pain. Fortunately it does not appear to be any evidence of infection which is good news. No fevers chills noted. 01/13/18 on evaluation today patient appears to be doing  rather well in regard to her bilateral lower extremities. The dressings did get kind of stuck as far as the wound beds are concerned but again I think this is mainly due to the fact  that she actually seems to be showing signs of healing which is good news. She's not having as much drainage therefore she was having more of the dressing sticking. Nonetheless overall I feel like her swelling is dramatically down compared to previous. 01/20/18 on evaluation today patient unfortunately though she's doing better in most regards has a large blister on the left anterior lower extremity where she is draining quite significantly. Subsequently this is going to need debridement today in order to see what's underneath and ensure she does not continue to trapping fluid at this location. Nonetheless No fevers, chills, nausea, or vomiting noted at this time. 01/27/18 on evaluation today patient appears to be doing rather well at this point in regard to her right lower extremity there's just a very small area that she still has open at this point. With that being said I do believe that she is tolerating the compression wraps very well in making good progress. Home health is coming out at this point to see her. Her left lower extremity on the lateral portion is actually what still mainly open and causing her some discomfort for the most part 02/10/18 on evaluation today patient actually appears to be doing very well in regard to her right lower extremity were all the ulcers appear to be completely close. In regard to the left lower extremity she does have two areas still open and some leaking from the dorsal surface of her foot but this still seems to be doing much better to me in general. 02/24/18 on evaluation today patient actually appears to be doing much better in regard to her right lower extremity this is still completely healed. Her left lower extremity is also doing much better fortunately she has no evidence of  infection. The one area that is gonna require some debridement is still on the left anterior shin. Fortunately this is not hurting her as badly today. 03/10/18 on evaluation today patient appears to be doing better in some regards although she has a little bit more open area on the dorsal foot and she also has some issues on the medial portion of the left lower extremity which is actually new and somewhat deep. With that being said there fortunately does not appear to be any significant signs of infection which is good news. No fevers, chills, nausea, or vomiting noted at this time. In general her swelling seems to be doing fairly well which is good news. 03/31/18 on evaluation today patient presents for follow-up concerning her left lower extremity lymphedema. Unfortunately she has been doing a little bit more poorly since I last saw her in regard to the amount of weeping that she is experiencing. She's also having some increased pain in the anterior shin location. Unfortunately I do not feel like the patient is making such good progress at this point a few weeks back she was definitely doing much better. 04/07/18 on evaluation today patient actually appears to be showing some signs of improvement as far as the left lower extremity is concerned. She has been tolerating the dressing changes and it does appear that the Drawtex did better for her. With that being said unfortunately home health is stating that they cannot obtain the Drawtex going forward. Nonetheless we're gonna have to check and see what they may be able to get the alginate they were using was getting stuck in causing new areas of skin being pulled all that with and subsequently weep and calls her to worsen overall this is  the first time we've seen improvement at this time. 04/14/18 on evaluation today patient actually appears to be doing rather well at this point there does not appear to be any evidence of infection at this time and she  is actually doing excellent in regard to the weeping in fact she almost has no openings remaining even compared to just last week this is a dramatic improvement. No fevers chills noted 04/21/18 evaluation today patient actually appears to be doing very well. She in fact is has a small area on the posterior lower extremity location and she has a small area on the dorsal surface of her foot that are still open both of which are very close to closing. We're hoping this will be close shortly. She brought her Juxta-Lite wrap with her today hoping that would be able to put her in it unfortunately I don't think were quite at that point yet but we're getting closer. 04/28/18 upon evaluation today patient actually appears to be doing excellent in regard to her left lower extremity ulcer. In fact the region on the posterior lower extremity actually is much smaller than previously noted. Overall I'm very happy with the progress she has made. She again did bring her Juxta-Lite although we're not quite ready for that yet. 05/11/18 upon evaluation today patient actually appears to be doing in general fairly well in regard to her left lower Trinity. The swelling is very well controlled. With that being said she has a new area on the left anterior lower extremity as well as between the first and second toes of her left foot that was not present during the last evaluation. The region of her posterior left lower extremity actually appears to be almost completely healed. T be honest I'm very pleased with o the way that stands. Nonetheless I do believe that the lotion may be keeping the area to moist as far as her legs are concerned subsequently I'm gonna consider discontinuing that today. 05/26/18 on evaluation today patient appears to be doing rather well in regard to her left lower should be ulcers. In fact everything appears to be close except for a very small area on the left posterior lower extremity. Fortunately  there does not appear to be any evidence of infection at this time. Overall very pleased with her progress. 06/02/18 and evaluation today patient actually appears to be doing very well in regard to her lower extremity ulcers. She has one small area that still continues to weep that I think may benefit her being able to justify lotion and user Juxta-Lite wraps versus continued to wrap her. Nonetheless I think this is something we can definitely look into at this point. 06/23/18 on evaluation today patient unfortunately has openings of her bilateral lower extremities. In general she seems to be doing much worse than when I last saw her just as far as her overall health standpoint is concerned. She states that her discomfort is mainly due to neuropathy she's not having any other issues otherwise. No fevers, chills, nausea, or vomiting noted at this time. 06/30/18 on evaluation today patient actually appears to be doing a little worse in regard to her right lower extremity her left lower extremity of doing fairly well. Fortunately there is no sign of infection at this time. She has been tolerating the dressing changes without complication. Home health did not come out like they were supposed to for the appropriate wrap changes. They stated that they never received the orders from [place] which  were fax. Nonetheless we will send a copy of the orders with the patient today as well. 07/07/18 on evaluation today patient appears to be doing much better in regard to lower extremities. She still has several openings bilaterally although since I last saw her her legs did show obvious signs of infection when she later saw her nurse. Subsequently a culture was obtained and she is been placed on Bactrim and Keflex. Fortunately things seem to be looking much better it does appear she likely had an infection. Again last week we'd even discussed it but again there really was not any obvious sign that she had infection therefore  we held off on the antibiotics. Nonetheless I'm glad she's doing better today. 07/14/18 on evaluation today patient appears to be doing much better regarding her bilateral lower Trinity's. In fact on the right lower for me there's nothing open at this point there are some dry skin areas at the sites where she had infection. Fortunately there is no evidence of systemic infection which is excellent news. No fevers chills noted 07/21/18 on evaluation today patient actually appears to be doing much better in regard to her left lower extremity ulcers. She is making good progress and overall I feel like she's improving each time I see her. She's having no pain I do feel like the infection is completely resolved which is excellent news. No fevers, chills, nausea, or vomiting noted at this time. 07/28/18 on evaluation today patient appears to be doing very well in regard to her left lower [employer]. Everything seems to be showing signs of improvement which is excellent news. Overall very pleased with the progress that has been made. Fortunately there's no evidence of active infection at this time also excellent news. 08/04/18 on evaluation today patient appears to be doing more poorly in regard to her bilateral lower extremities. She has two new areas open up on the right and these were completely closed as of last week. She still has the two spots on the left which in my pinion seem to be doing better. Fortunately there's no evidence of infection again at this point. 08/11/18 on evaluation today patient actually appears to be doing very well in regard to her bilateral lower Trinity wounds that all seem to be doing better and are measures smaller today. Fortunately there's no signs of infection. No fevers, chills, nausea, or vomiting noted at this time. 08/18/18 on evaluation today patient actually appears to be doing about the same inverter bilateral lower extremities. She continues to have areas that  blistering open as was drain that fortunately nothing too significant. Overall I feel like Drawtex may have done better for her however compared to the collagen. 08/25/18 on evaluation today patient appears to be doing a little bit more poorly today even compared to last time I saw her. Again I'm not exactly sure why she's making worse progress over the past several weeks. I'm beginning to wonder if there is some kind of underlying low level infection causing this issue. I did actually take a culture from the left anterior lower extremity but it was a new wound draining quite a bit at this point. Unfortunately she also seems to be having more pain which is what also makes me worried about the possibility of infection. This is despite never erythema noted at this point. 09/01/18 on evaluation today patient actually appears to be doing a little worse even compared to last week in regard to bilateral lower extremities. She did  go to the hospital on the 16th was given a dose of IV Zosyn and then discharged with a recommendation to continue with the Bactrim that I previously prescribed for her. Nonetheless she is still having a lot of discomfort she tells me as well at this time. This is definitely unfortunate. No fevers, chills, nausea, or vomiting noted at this time. 09/08/18 on evaluation today patient's bilateral lower extremities actually appear to be shown signs of improvement which is good news. Fortunately there does not appear to be any signs of active infection I think the anabiotic is helping in this regard. Overall I'm very pleased with how she is progressing. 09/15/18 patient was actually seen in ER yesterday due to her legs as well unfortunately. She states she's been having a lot of pain and discomfort as well as a lot of drainage. Upon inspection today the patient does have a lot of swelling and drainage I feel like this is more related to lymphedema and poor fluid control than it is to infection  based on what I'm seeing. The physician in the emergency department also doubted that the patient was having a significant infection nonetheless I see no evidence of infection obvious at this point although I do see evidence of poor fluid control. She still not using a compression pumps, she is not elevating due to her lift chair as well as her hospital bed being broken, and she really is not keeping her legs up as much as they should be and also has been taking off her wraps. All this combined I think has led to poor fluid control and to be honest she may be somewhat volume overloaded in general as well. I recommend that she may need to contact your physician to see if a prescription for a diuretic would be beneficial in their opinion. As long as this is safe I think it would likely help her. 09/29/18 on evaluation today patient's left lower extremity actually appears to be doing quite a bit better. At least compared to last time that I saw her. She still has a large area where she is draining from but there's a lot of new skin speckled trout and in fact there's more new skin that there are open areas of weeping and drainage at this point. This is good news. With regard to the right lower extremity this is doing much better with the only open area that I really see being a dry spot on the right lateral ankle currently. Fortunately there's no signs of active infection at this time which is good news. No fevers, chills, nausea, or vomiting noted at this time. The patient seems somewhat stressed and overwhelmed during the visit today she was very lethargic as such. She does and she is not taking any pain medications at this point. Apparently according to her husband are also in the process of moving which is probably taking its toll on her as well. 10/06/18 on evaluation today patient appears to be doing rather well in regard to her lower extremities compared to last evaluation. Fortunately there's no  signs of active infection. She tells me she did have an appointment with her primary. Nonetheless he was concerned that the wounds were somewhat deep based on pictures but we never actually saw her legs. She states that he had her somewhat worried due to the fact that she was fearing now that she was [place] have to have an amputation. With that being said based on what I'm seeing check  she looks better this week that she has the last two times I've seen her with much less drainage I'm actually pleased in this regard. That doesn't mean that she's out of the water but again I do not think what the point of talking about education at all in regard to her leg. She is very happy to hear this. She is also not having as much pain as she was having last week. 10/13/18 unfortunately on evaluation today patient still continues to have a significant amount of drainage she's not letting home health actually apply the compression dressings at this point. She's trying to use of Juxta-Lite of the top of Kerlex and the second layer of the three layer compression wrap. With that being said she just does not seem to be making as good a progress as I would expect if she was having the compression applied and in place on a regular basis. No fevers, chills, nausea, or vomiting noted at this time. 10/20/18 on evaluation today patient appears to be doing a little better in regard to her bilateral lower extremity ulcers. In fact the right lower extremity seems to be healed she doesn't even have any openings at this point left lower extremity though still somewhat macerated seems to be showing signs of new skin growth at multiple locations throughout. Fortunately there's no evidence of active infection at this time. No fevers, chills, nausea, or vomiting noted at this time. 10/27/18 on evaluation today patient appears to be doing much better in regard to her left lower Trinity ulcer. She's been tolerating the laptop complication  and has minimal drainage noted at this point. Fortunately there's no signs of active infection at this time. No fevers, chills, nausea, or vomiting noted at this time. 11/03/18 on evaluation today patient actually appears to be doing excellent in regard to her left lower extremity. She is having very little drainage at this point there does not appear to be any significant signs of infection overall very pleased with how things have gone. She is likewise extremely pleased still and seems to be making wonderful progress week to week. I do believe antibiotics were helpful for her. Her primary care provider did place on amateur clean since I last saw her. 11/17/18 on evaluation today patient appears to be doing worse in regard to her bilateral lower extremities at this point. She is been tolerating the dressing changes without complication. With that being said she typically takes the [place] off fairly quickly upon arriving home even after being seen here in the clinic and does not allow home health reapply command as part of the dressing at home. Therefore she said no compression essentially since I last saw her as best I can tell. With that being said I think it shows and how much swelling she has in the open wounds that are noted at this point. Fortunately there's no signs of infection but unfortunately if she doesn't get this under control I think she will end up with infection and more significant issues. 11/24/18 on evaluation today patient actually appears to be doing somewhat better in regard to her bilateral lower extremities. 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 Nonetheless that is doing better currently so she should be able to attempt reinitiating the lymphedema pumps at this time. No fevers, chills,  nausea, or vomiting noted at this time. 12/01/18  upon evaluation today patient's left lower extremity appears to be doing quite well unfortunately her right lower extremity is not doing nearly as well. She has been tolerating the dressing changes without complication unfortunately she did not keep a wrap on the right at this time. Nonetheless I believe this has led to increased swelling and weeping in the world is actually much larger than during the last evaluation with her. 12/08/18 on evaluation today patient appears to be doing about the same at this point in regard to her right lower extremity. There is some more palatable to touch I'm concerned about the possibility of there being some infection although I think the main issue is she's not keeping her compression wrap on which in turn is not allowing this area to heal appropriately. 12/22/18 on evaluation today patient appears to be doing better in regard to left lower extremity unfortunately significantly worse in regard to the right lower extremity. The areas of blistering and necrotic superficial tissue have spread and again this does not really appear to be signs of infection and all she just doesn't seem to be doing nearly as well is what she has been in the past. Overall I feel like the Augmentin did absolutely nothing for her she doesn't seem to have any infection again I really didn't think so last time either is more of a potential preventative measure and hoping that this would make some difference but I think the main issue is she's not wearing her compression. She tells me she cannot wear the [place] been we put on she takes it off pretty much upon getting home. Subsequently she worshiped Juxta-Lite when I questioned her about how often she wears it this is no more than three hours a day obviously that leaves 21 hours that she has no compression and this is obviously not doing well for her. Overall I'm concerned that if things continue to worsen she is at great risk of both infection  as well as losing her leg. 01/05/19 on evaluation today patient appears to be doing well in regard to her left lower extremity which he is allowing [place] to wrap and not so well with regard to her right lower extremity which she is not allowing [place] to really wrap and keep the wrap on. She states that it hurts too badly whenever it's wrapped and she ends up having to take it off. She's been using the Juxta-Lite she tells me up to six hours a day although I question whether or not that's really been the case to be honest. Previously she told me three hours today nonetheless obviously the legs as long as the wrap is doing great when she is not is doing much more poorly. 01/12/2019 on evaluation today patient actually appears to be doing a little better in my opinion with regard to her right lower extremity ulcer. She has a small open area on the left lower extremity unfortunately but again this I think is part of the normal fluctuation of what she is going to have to expect with regard to her legs especially when she is not using her lymphedema pumps on a regular basis. Subsequently based on what I am seeing today I think that she does seem to be doing slightly better with regard to her right lower extremity she did see her primary care provider on Monday they felt she had an infection and placed her on 2 antibiotics. Both Cipro  and clindamycin. Subsequently again she seems possibly to be doing a little bit better in regards to the right lower extremity she also tells me however she has been wearing the compression wrap over the past week since I spoke with her as well that is a Kerlix and Coban wrap on the right. No fevers, chills, nausea, vomiting, or diarrhea. 01/19/2019 on evaluation today patient appears to be doing better with regard to her bilateral lower extremities especially the right. I feel like the compression has been beneficial for her which is great news. She did get a call from her primary care  provider on her way here today telling her that she did have methicillin- resistant Staphylococcus aureus and he was calling in a couple new antibiotics for her including a ointment to be applied she tells me 3 times a day. With that being said this sounds like likely to be Bactroban which I think could be applied with each dressing/wrap change but I would not be able to accommodate her applying this 3 times a day. She is in agreement with the least doing this we will add that to her orders today. 01/26/2019 on evaluation today patient actually appears to be doing much better with regard to her right lower extremity. Her left lower extremity is also doing quite well all things considering. Fortunately there is no evidence of active infection at this time. No fevers, chills, nausea, vomiting, or diarrhea. 02/02/2019 on evaluation today patient appears to be doing much better compared to her last evaluation. Little by little off like her right leg is returning more towards normal. There does not appear to be any signs of active infection and overall she seems to be doing quite well which is great news. I am very pleased in this regard. No fevers, chills, nausea, vomiting, or diarrhea. 02/09/2019 upon evaluation today patient appears to be doing better with regard to her bilateral lower extremities. She has been tolerating the dressing changes without complication. Fortunately there is no signs of active infection at this time. No fevers, chills, nausea, vomiting, or diarrhea. 02/23/2019 on evaluation today patient actually appears to be doing quite well with regard to her bilateral lower extremities. She has been tolerating the dressing changes without complication. She is even used her pumps one time and states that she really felt like it felt good. With that being said she seems to be in good spirits and her legs appear to be doing excellent. 03/09/2019 on evaluation today patient appears to be doing well  with regard to her right lower extremity there are no open wounds at this time she is having some discomfort but I feel like this is more neuropathy than anything. With regard to her left lower extremity she had several areas scattered around that she does have some weeping and drainage from but again overall she does not appear to be having any significant issues and no evidence of infection at this time which is good news. 03/23/2019 on evaluation today patient appears to be doing well with regard to her right lower extremity which she tells me is still close she is using her juxta light here. Her left lower extremity she mainly just has an area on the foot which is still slightly draining although this also is doing great. Overall very pleased at this time. 04/06/2019 patient appears to be doing a little bit worse in regard to her left lower extremity upon evaluation today. She feels like this could be becoming infected  again which she had issues with previous. Fortunately there is no signs of systemic infection but again this is always a struggle with her with her legs she will go from doing well to not so well in a very short amount of time. 04/20/2019 on evaluation today patient actually appears to be doing quite well with regard to her right lower extremity I do not see any signs of active infection at this time. Fortunately there is no fever chills noted. She is still taking the antibiotics which I prescribed for her at this point. In regard to the left lower extremity I do feel like some of these areas are better although again she still is having weeping from several locations at this time. 04/27/2019 on evaluation today patient appears to be doing about the same if not slightly worse in regard to her left lower extremity ulcers. She tells me when questioned that she has been sleeping in her Hoveround chair in fact she tells me she falls asleep without even knowing it. I think she is spending a  whole lot of time in the chair and less time walking and moving around which is not good for her legs either. On top of that she is in a seated position which is also the worst position she is not really elevating her legs and she is also not using her lymphedema pumps. All this is good to contribute to worsening of her condition in general. 05/18/2019 on evaluation today patient appears to be doing well with regard to her lower extremity on the right in fact this is showing no signs of any open wounds at this time. On the left she is continuing to have issues with areas that do drain. Some of the regions have healed and there are couple areas that have reopened. She did go to the ER per the patient according to recommendations from the home health nurse due to what she was seen when she came out on 05/13/2019. Subsequently she felt like the patient needed to go to the hospital due to the fact that again she was having "milky white discharge" from her leg. Nonetheless she had and then was placed on doxycycline and subsequently seems to be doing better. 06/01/2019 upon evaluation today patient appears to be doing really in my opinion about the same. I do not see any signs of active infection which is good news. Overall she still has wounds over the bilateral lower extremities she has reopened on the right but this appears to be more of a crack where there is weeping/edema coming from the region. I do not see any evidence of infection at either site based on what I visualized today. 07/13/2019 upon evaluation today patient appears to be doing a little worse compared to last time I saw her. She since has been in the hospital from 06/21/2019 through 06/29/2019. This was secondary to having Covid. During that time they did apply lotion to her legs which unfortunately has caused her to develop a myriad of open wounds on her lower extremities. Her legs do appear to be doing better as far as the overall  appearance is concerned but nonetheless she does have more open and weeping areas. 07/27/2019 upon evaluation today patient appears to be doing more poorly to be honest in regard to her left lower extremity in particular. There is no signs of systemic infection although I do believe she may have local infection. She notes she has been having a lot of blue/green  drainage which is consistent potentially with Pseudomonas. That may be something that we need to consider here as well. The doxycycline does not seem to have been helping. 08/03/2019 upon evaluation today patient appears to be doing a little better in my opinion compared to last week's evaluation. Her culture I did review today and she is on appropriate medications to help treat the Enterobacter that was noted. Overall I feel like that is good news. With that being said she is unfortunately continuing to have a lot of drainage and though it is doing better I still think she has a long ways to go to get things dried up in general. Fortunately there is no signs of systemic infection. 08/10/2019 upon evaluation today patient appears to be doing may be slightly better in regard to her left lower extremity the right lower extremity is doing much better. Fortunately there is no signs of infection right now which is good news. No fevers, chills, nausea, vomiting, or diarrhea. 08/24/2019 on evaluation today patient appears to be doing slightly better in regard to her lower extremities. The left lower extremity seems to be healed the right lower extremity is doing better though not completely healed as far as the openings are concerned. She has some generalized issues here with edema and weeping secondary to her lymphedema though again I do believe this is little bit drier compared to prior weeks evaluations. In general I am very pleased with how things seem to be progressing. No fevers, chills, nausea, vomiting, or diarrhea. 08/31/2019 upon evaluation today  patient actually seems to making some progress here with regard to the left lower extremity in particular. She has been tolerating the dressing changes without complication. Fortunately there is no signs of active infection at this time. No fevers, chills, nausea, vomiting, or diarrhea. She did see Dr. Doren Custard and he did note that she did have a issue with the left great saphenous vein and the small saphenous vein in the leg. With that being said he was concerned about the possibility of laser ablation not being extremely successful. He also mentioned a small risk of DVT associated with the procedure. However if the wounds do not continue to improve he stated that that would probably be the way to go. Fortunately the patient's legs do seem to be doing much better. 09/07/2019 upon evaluation today patient appears to be doing better with regard to her lower extremities. She has been tolerating the dressing changes without complication. With that being said she is showing signs of improvement and overall very pleased. There are some areas on her leg that I think we do need to debride we discussed this last week the patient is in agreement with doing that as long as it does not hurt too badly. 09/14/2019 upon evaluation today patient appears to be doing decently well with regard to her left lower extremity. She is not having near as much weeping as she has had in the past things seem to be drying up which is good news. There is no signs of active infection at this time. 09/21/19 upon evaluation today patient appears to be doing better in regard overall to her bilateral lower extremities. She again has less open than she did previous and each week I feel like this is getting better. Fortunately there is no signs of active infection at this time. No fevers, chills, nausea, vomiting, or diarrhea. 09/28/2019 upon evaluation today patient actually appears to be showing signs of improvement with regard to her  left  lower extremity. Unfortunately the right medial lower extremity around the ankle region has reopened to some degree but this appears to be minimal still which is good news. There is no signs of active infection at this time which is also good news. 10/12/2019 upon evaluation today patient appears to be doing okay with regard to her bilateral lower extremities today. The right is a little bit worse then last evaluation 2 weeks ago. The left is actually doing a little better in my opinion. Overall there is no signs of active infection at this time that I see. Obviously that something we have to keep a close eye on she is very prone to this with the significant and multiple openings that she has over the bilateral lower extremities. 10/19/2019 upon evaluation today patient appears to be doing about the best that I have seen her in quite some time. She has been tolerating the dressing changes without complication. There does not appear to be any signs of active infection and overall I am extremely happy with the way her legs appeared. She is drying up quite nicely and overall is having less pain. 11/09/2019 upon evaluation today patient appears to be doing better in regard to her wounds. She seems to be drying up more and more each time I see her this is just taking a very long time. Fortunately there is no signs of active infection at this time. 11/23/2019 upon evaluation today patient actually appears to be doing excellent in regard to her lower extremities at this point compared to where she has been. Fortunately there is no signs of active infection at this time. She did go to the hospital last week for nausea and vomiting completely unrelated to her wounds. Fortunately she is doing better she was given some Reglan and got better. She had associated abdominal pain but they never found out what was going on. 12/07/2019 upon evaluation today patient appears to be doing well for the most part in regard to her  legs. She unfortunately has not been keeping the Coban portion of her wraps on therefore the compression has not really been sufficient for what it is supposed to be. Nonetheless she tells me that it just hurt too bad therefore she removed it. 12/21/2019 upon evaluation today patient actually appears to be doing quite well with regard to her legs. I do feel like she has been making progress which is great news and overall there is no signs of active infection at this time. No fevers, chills, nausea, vomiting, or diarrhea. 01/04/2020 upon evaluation today patient presents for follow-up concerning her lower extremity edema bilaterally. She still has open wounds she has not been using her lymphedema pumps. She is also not been utilizing her compression wraps appropriately she tends to unwrap them, take them off, or states that they hurt. Obviously the reason they hurt is because her legs start to swell but the issue is if she would use her compression/lymphedema pumps regularly she would not swell and she would have the pain. Nonetheless she has not even picked them up once honestly over the past several months and may be even as much as in the past year based on my opinion and what have seen. She tells me today that after last week when I talked about this with her specifically actually that was 2 weeks ago that she "forgot". 8//21 on evaluation today patient appears to be doing a little better in regard to her legs bilaterally. Fortunately there  is no signs of active infection at this time. She tells me that she used her lymphedema pumps all of one time over the past 2 weeks since I last saw her. She tells me that she has been too busy in order to continue to use these. 02/01/2020 on evaluation today patient appears to be doing some better in regard to her wounds in general in her legs. We felt the right was healed although is not completely it does appear to be doing better she tells me she has been using  her lymphedema pumps that she has had this six times since I last saw her. Obviously the more she does that the better she would do my opinion 02/15/2020 upon evaluation today patient appears to be doing about the same in regard to her legs. She tells me that she is pumping I'm still not sure how much she does to be perfectly honest. However even if she does a little bit here and there I guess that is better than nothing. Fortunately there is no sign of active infection at this time which is great news. No fevers, chills, nausea, vomiting, or diarrhea. 02/29/2020 on evaluation today patient actually appears to be doing quite well all things considered this week. She has been tolerating the dressing changes without complication. Fortunately there is no signs of active infection at this time. No fevers, chills, nausea, vomiting, or diarrhea. 03/14/2020 upon evaluation today patient appears to be doing really about the same in regard to her legs. There is no signs of improvement overall and she as noted from home health does not appear to be elevating her legs he can get into her lift chair. There is too much stuff piled up on it the patient tells me. She also tells me she cannot really use her pumps effectively due to the fact that she cannot have any space to get them on. Finally she is also not really elevating her legs because she is not sleeping in her bed she is sleeping in her chair currently and again overall I think everything that she is done in combination has been exactly the wrong thing for what she needs for her legs. 04/04/2020 upon evaluation today patient appears to be doing well at this time with regard to her legs. She is actually been pumping, keeping her wraps on, and to be honest she seems to be doing dramatically better the right leg is excellent the left leg is also excellent and measuring much smaller than previous. 04/18/2020 upon evaluation today patient actually is continue to make  good progress in regard to her lower extremities bilaterally. Everything is improving and less wet that has been in the past overall I am extremely pleased with where things stand and I think that she is making great progress. The patient tells me she still continue to use her compression pumps 05/02/2020 on evaluation today patient appears to be doing well at this time in regard to her left leg which is showing signs of drying up. With that being said she does have a lot of lymphedema type crusty skin around the toes of her left foot and the right medial ankle which has opened at this point. Fortunately there is no signs of active infection systemically at this point or even locally for that matter. 05/23/2020 on evaluation today patient appears to be doing well with regard to her lower extremities. Fortunately there is no signs of active infection at this time. No fever chills noted.  She has been very depressed however she tells me. 06/06/2020 patient came in today for evaluation in regard to her bilateral lower extremity ulcerations. With that being said she came in feeling okay and actually laughing and joking around with the staff checking her in. Subsequently however she had a coughing spell and following the coughing spell it was a dramatic conversion from being jovial and joking around to being extremely short of breath her vital signs actually dropped in regard to her blood pressure from around 175 to down around 481 for systolic and from around 96 diastolic down to around 70. With that being said she also accompanied this with an increase in her respiratory rate which was also quite significant. Nonetheless I actually upon going into see her was extremely worried we called EMS to have her transported to the ER for further evaluation and treatment. She continued until EMS got here to be extremely short of breath even on 6 L of oxygen. Her oxygen saturation did come up to 99% but overall it was  only 95 before which was not terrible she did feel like the oxygen helped her feel somewhat better however. She has a history of asthma but again even the coughing spell really should not have done this degree of alteration in her demeanor from where she was just before to after the coughing spell. She tells [place] however she has been feeling somewhat abnormal since Friday although she could not really pinpoint exactly what was going on. 06/27/2020 plan evaluation today patient appears to be doing okay in regard to her leg ulcers. She is really not showing a lot of improvement to be honest and she still has a lot of weeping. With that being said after I last saw her we called EMS to take her to the hospital she actually did not go she went to see her primary care provider who according to the patient have not identified and read through the note states that everything checked out okay. Nonetheless the patient has continued to have bouts where she gets very short of breath for seemingly no reason whatsoever. That happened even the same night that I saw her last time. This was after she refused transport to the hospital. With that being said she also tells me that just getting dressed to come here is quite a chore which is putting on her shirt causing her to have to stop to catch her breath. Obviously this is not normal and I feel like there is something going on. She may need a referral to be seen by cardiology ASAP. She does see her primary care provider early next week she tells me 07/11/2020 upon evaluation today patient's wounds again appear to be doing about the same she mainly has weeping of the bilateral lower extremities both are very swollen today she tells me she has been using her pumps but last time I saw her she told me she did not even have her pumps out that she had "packed them away because they had too much stuff in their house. Apparently her husband has been bringing stuff in from storage  units. She then subsequently told me that she had so much stuff in her house that she has been staying up all night and did not go to bed till 7:00 this morning because she was cleaning up stuff. Nonetheless I am still unsure as to whether or not she is using the pumps based on what I am seeing I would think  probably not. Nonetheless she has been keeping the compression wraps in place that is at least something 07/25/2020 upon evaluation today patient appears to be doing well currently in regard to her legs all things considered. I do not think she is doing any worse significantly although honestly I do not think she is doing a lot better either. There does not appear to be any signs of infection which is good news although she does have a lot of weeping. She is using her pumps she tells me sporadically though she says that her husband is not a big fan of helping her to get the [employer]. She also tells me is difficult because in their house. It would be easiest for her to use these as where her son is actually staying with her and living out at this point. Obviously I understand that she is having a lot of issues here and to be honest I do not really know what to say in that regard and the fact that I really cannot change the situations but I do believe she really needs to be using the lymphedema pumps to try to keep things under control here. Outside of that I think she is going to continue to have significant issues. I think the pumps coupled with good compression and elevation are the only things that are to help her at this point. 08/08/2020 upon evaluation today patient appears to be doing a little worse in regard to her legs in general. I think this may be due to some infection currently. With that being said I am going to go ahead and likely see about putting her on an antibiotic. Were also can obtain a culture today where she had some purulent drainage. 08/29/2020 upon evaluation today patient  appears to be doing about the same in regard to her bilateral lower extremities. Unfortunately she is continuing to have significant issues here with edema and she has significant lymphedema. With that being said she is really not doing anything that she is supposed to be doing as far as elevation, compression, using lymphedema pumps or anything really for that matter. She does not use her pumps, is taken the wraps off shortly after having them put on as far as home health is concerned at least by the next day she tells me, and overall is not really elevating her legs she is telling me she has a lot to do as far as cleaning up in her house. Nonetheless this triad of noncompliance is leading to worsening not improving in regard to her legs. I had a very strong conversation with her about this today again. 09/12/2020 on evaluation today patient appears to be doing poorly with regard to her lower extremities bilaterally. Fortunately there does not appear to be any signs of active infection at this point. That is systemically. Locally there is some signs of pus coming from an area on her left leg. She has been on antibiotics recently though she is done with those currently. Nonetheless she tells me she has been having increased pain with the right leg this is extremely swollen as well. Fortunately there does not appear to be significant infection of the right leg though I think her pain is really as result of the increased swelling she has been declining the wrap on the right leg and the wounds here are getting significantly worse week by week. This obviously is not will be want to see. I discussed with the patient however if she does not  use the wraps, use her pumps, and elevate her legs that she is not to get better. She has been sleeping in her motorized Hoveround which does not even allow her to elevate her legs at all. She tells me that she is also constantly "cleaning up as her husband told her that he  wants to move back to [place]." Unfortunately this has been the narrative for the past several months in fact that she tells me that she has been having to clean up mass that was moved in from storage units into their home and that she does not really sleep and she never really elevates her legs. I explained to the patient today that if she is not can I do any of this that there is really nothing that I can do to help her. 09/26/20 upon evaluation today patient appears to be doing about the same in regard to her wounds. Fortunately there is no signs of active infection at this time. No fever chills noted. She has been tolerating the dressing changes without complication which is good news. With that being said I do think that she unfortunately is still having a lot of pain but I think this is due to the swelling that really is not controlled and as I discussed with her last time she needs to be using her lymphedema pumps there is still in the box she has not even attempted to use those whatsoever. I am also not even certain she is taking her fluid pills as she states she may need to have "get a refill" she was seen with her husband at the appointment today. 10/24/2020 upon evaluation today patient appears to be doing decently well in regard to her legs all things considered. She has been tolerating the dressing changes without complication. Fortunately there is no signs of active infection at this time. No fevers, chills, nausea, vomiting, or diarrhea. With that being said the patient does appear to have some excessive thickened skin buildup currently that has been require some sharp debridement to clear this away. I am hopeful if we do this we will be able to get some of this area to dry out this otherwise trapping fluid underneath. Her son is present during the office visit today. She tells me she still been doing a lot as far as cleaning up and going through boxes she tells me she has been up for  a long time already this morning. 5/25; patient presents for 2-week follow-up. She had 3 layer compression wrap placed with calcium alginate underneath. She reports tolerating the wraps well. She has home health that changes the wrap 2-3 times a week. She denies signs of infection. She has almost finished her course of antibiotics. She uses her lymphedema pumps once weekly. 11/21/2020 upon evaluation today patient appears to be doing well with regard to her legs all things considered. Fortunately there is no signs of active infection at this time which is great news I do not see any evidence of infection and overall I think that she is definitely improved compared to where things did previous. Nonetheless I do think that she may benefit from removing some of the thicker skin on the legs. Again we have attempted this in the past to some degree but I think that we might be able to more effectively do this of a clear some way and then potentially use a urea cream with salicylic acid to try to help clear some of this away. The patient  is in agreement with the plan. 12/05/2020 upon evaluation today patient appears to be doing well with regard to her wounds in general. I feel like that things are somewhat improved the skin looking a little better. With that being said she still has significant lymphedema. She is not been using her lymphedema pumps and not really been elevating her legs. She also did have some maggots noted on the left leg she tells me that the "gnats are terrible and get all over her foot." With that being said I am concerned about the fact that the patient still is quite swollen even with the compression wraps I really feel like she needs to be using her pumps but I cannot talk her into doing this. 12/19/2020 upon evaluation today patient appears to be doing about the same in regard to her legs. Fortunately I do not see any signs of infection unfortunately she really seems to be very nonchalant  about the fact that she has not been using her lymphedema pumps. She tells me that "I just forget". With that being said this needs to be something that is a priority for her I cannot count the number of times that have had this discussion with her and yet nothing seems to change. With that being said I think that is very unlikely she is ever really getting healed with regard to her wounds to be honest she does not seem to be motivated to do anything to try to help at this point despite everything that I have tried to do and recommend for her. 01/02/2021 upon evaluation today patient actually appears to be doing excellent in regard to her wounds. She has been tolerating the dressing changes without complication. Fortunately there does not appear to be any signs of active infection at this time which is great news. No fevers, chills, nausea, vomiting, or diarrhea. 01/23/2021 upon evaluation today patient appears to be doing worse in general in regard to her wounds. This is due to the fact that she is not been using her compression pumps which again she knows when she does not it always turns out poorly for her. Unfortunately the issue here today is that she is continuing to have troubles with her son she seems to be very depressed and I am truly sorry for her and everything she is going through. With that being said I do not know what to do to make things better if she want elevate her legs and use her lymphedema pumps there is really not have anything I can do to correct or fix the situation for her to be honest. I discussed all this with her today. 02/20/2021 upon evaluation today the patient unfortunately has been in the hospital due to cellulitis. Again this is something that is been an ongoing issue based on what I am seeing I think that she unfortunately still is having trouble complying with anything that we recommend. She has not been sleeping in her bed she tells me that she continues sleeping  really in her motorized wheelchair that sweats been the case for the past several visits that have seen her despite me counseling to the opposite that she should be trying to sleep in her bed where she could get her legs off the ground and allow some of the swelling edema to come down. Subsequently also it appears that someone not sure his primary care otherwise has given orders for her to not be wrapped and subsequently to be washing her legs daily  which she is not doing. This was prior to her going into the hospital. She also tells me she is not been using her lymphedema pumps even before the infection setting and she went to the hospital. She also tells me that she does not always take her fluid pills and again the compression wraps have not been in place. Pretty much she has not done anything that I recommended up to this point. Again I had this conversation with her multiple times and unfortunately just does not seem to be sinking in that she really needs to be taking care of herself better. She is extremely noncompliant. 03/13/2021 upon evaluation today patient appears to be doing poorly still in regard to her legs. She is still not using any of the compression wrapping at this time on a regular basis and that she is taken it off. I discussed with her that she needs to not be doing this that that is definitely something that is going to cause her long-term trouble and definitely not help her to heal. She is also not use her lymphedema pumps but 1 time since last time I saw her 2 weeks ago based on what I am hearing. Nonetheless my issue here is simply that compliance is a big factor I think in her not getting better and this continues to be a problem. I think that I am very close to having a discussion with her that we may be discharging her due to noncompliance if she does not step things up. Objective Constitutional Obese and well-hydrated in no acute distress. Vitals Time Taken: 1:24 PM,  Height: 62 in, Weight: 335 lbs, BMI: 61.3, Temperature: 98.7 F, Pulse: 106 bpm, Respiratory Rate: 20 breaths/min, Blood Pressure: 167/86 mmHg, Capillary Blood Glucose: 300 mg/dl. Respiratory normal breathing without difficulty. Psychiatric this patient is able to make decisions and demonstrates good insight into disease process. Alert and Oriented x 3. pleasant and cooperative. General Notes: Upon inspection patient's wound bed actually showed signs of significant weeping and edema I do not see any obvious signs of infection but in general I do not think she is doing nearly as good as she has been in the past. Obviously when she is done well is always been when she was actually using her pumps, keeping the wraps on, and elevating her legs but these kinds of been few and far between. Integumentary (Hair, Skin) Wound #61 status is Open. Original cause of wound was Gradually Appeared. The date acquired was: 04/20/2019. The wound has been in treatment 99 weeks. The wound is located on the Left,Circumferential Lower Leg. The wound measures 13cm length x 30cm width x 0.1cm depth; 306.305cm^2 area and 30.631cm^3 volume. There is Fat Layer (Subcutaneous Tissue) exposed. There is no tunneling or undermining noted. There is a large amount of purulent drainage noted. The wound margin is distinct with the outline attached to the wound base. There is small (1-33%) red, pink granulation within the wound bed. There is a large (67- 100%) amount of necrotic tissue within the wound bed including Adherent Slough. Wound #64 status is Open. Original cause of wound was Gradually Appeared. The date acquired was: 06/01/2019. The wound has been in treatment 93 weeks. The wound is located on the Left,Dorsal Foot. The wound measures 6cm length x 7cm width x 0.1cm depth; 32.987cm^2 area and 3.299cm^3 volume. There is Fat Layer (Subcutaneous Tissue) exposed. There is no tunneling or undermining noted. There is a large amount  of serous drainage noted. The wound  margin is distinct with the outline attached to the wound base. There is small (1-33%) red granulation within the wound bed. There is a large (67-100%) amount of necrotic tissue within the wound bed including Adherent Slough. Wound #67 status is Open. Original cause of wound was Gradually Appeared. The date acquired was: 05/02/2020. The wound has been in treatment 45 weeks. The wound is located on the Right,Medial Lower Leg. The wound measures 7cm length x 7.5cm width x 0.1cm depth; 41.233cm^2 area and 4.123cm^3 volume. There is Fat Layer (Subcutaneous Tissue) exposed. There is no tunneling or undermining noted. There is a large amount of purulent drainage noted. The wound margin is distinct with the outline attached to the wound base. There is medium (34-66%) pink granulation within the wound bed. There is a medium (34-66%) amount of necrotic tissue within the wound bed. Wound #71 status is Open. Original cause of wound was Gradually Appeared. The date acquired was: 01/23/2021. The wound has been in treatment 7 weeks. The wound is located on the Right,Posterior Lower Leg. The wound measures 5cm length x 3cm width x 0.1cm depth; 11.781cm^2 area and 1.178cm^3 volume. There is Fat Layer (Subcutaneous Tissue) exposed. There is no tunneling or undermining noted. There is a medium amount of purulent drainage noted. The wound margin is distinct with the outline attached to the wound base. There is medium (34-66%) red granulation within the wound bed. There is a medium (34- 66%) amount of necrotic tissue within the wound bed including Adherent Slough. Wound #72 status is Open. Original cause of wound was Gradually Appeared. The date acquired was: 02/20/2021. The wound has been in treatment 3 weeks. The wound is located on the Right,Dorsal Foot. The wound measures 1cm length x 2cm width x 0.1cm depth; 1.571cm^2 area and 0.157cm^3 volume. There is Fat Layer (Subcutaneous  Tissue) exposed. There is no tunneling or undermining noted. There is a large amount of serosanguineous drainage noted. The wound margin is distinct with the outline attached to the wound base. There is large (67-100%) red granulation within the wound bed. There is a small (1-33%) amount of necrotic tissue within the wound bed including Adherent Slough. Wound #73 status is Healed - Epithelialized. Original cause of wound was Gradually Appeared. The date acquired was: 02/20/2021. The wound has been in treatment 3 weeks. The wound is located on the Right,Lateral Lower Leg. The wound measures 0cm length x 0cm width x 0cm depth; 0cm^2 area and 0cm^3 volume. 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 There was a Three Layer Compression Therapy Procedure by Lorrin Jackson, RN. Post procedure Diagnosis Wound #61: Same as Pre-Procedure Wound #64 Pre-procedure diagnosis of Wound #64 is a Diabetic Wound/Ulcer of the Lower Extremity located on the Left,Dorsal Foot . There was a Three Layer Compression Therapy Procedure by Lorrin Jackson, RN. Post procedure Diagnosis Wound #64: Same as Pre-Procedure  Wound #67 Pre-procedure diagnosis of Wound #67 is a Diabetic  Wound/Ulcer of the Lower Extremity located on the Right,Medial Lower Leg . There was a Three Layer Compression Therapy Procedure by Lorrin Jackson, RN. Post procedure Diagnosis Wound #67: Same as Pre-Procedure Wound #71 Pre-procedure diagnosis of Wound #71 is a Lymphedema located on the Right,Posterior Lower Leg . There was a Three Layer Compression Therapy Procedure by Lorrin Jackson, RN. Post procedure Diagnosis Wound #71: Same as Pre-Procedure Wound #72 Pre-procedure diagnosis of Wound #72 is a Lymphedema located on the Right,Dorsal Foot . There was a Three Layer Compression Therapy Procedure by Lorrin Jackson, RN. Post procedure Diagnosis Wound #72: Same as Pre-Procedure Plan Follow-up Appointments: Return Appointment in 1 week. - with [facility] Shower/ Hygiene: May shower and wash wound with soap and water. - with dressing changes, wash both legs with wash cloth and soap and water with dressing changes Other Bathing/Shower/Hygiene Orders/Instructions: - Home Health to wash legs with warm soapy water and a clean wash cloth with dressing changes Edema Control - Lymphedema / SCD / Other: Lymphedema Pumps. Use Lymphedema pumps on leg(s) 2-3 times a day for 45-60 minutes. If wearing any wraps or hose, do not remove them. Continue exercising as instructed. Elevate legs to the level of the heart or above for 30 minutes daily and/or when sitting, a frequency of: - especially at night Avoid standing for long periods of time. Exercise regularly Home Health: New wound care orders this week; continue Home Health for wound care. May utilize formulary equivalent dressing for wound treatment orders unless otherwise specified. - Primary dressing changed to Hydrofera Blue. Wash legs with warm soapy water and wash cloth with dressing changes Dressing changes to be completed by [place] on Monday / Wednesday / Friday except when patient has scheduled visit at [facility]. Other Home Health  Orders/Instructions: - [facility] WOUND #61: - Lower Leg Wound Laterality: Left, Circumferential Cleanser: Soap and Water 3 x Per Week/30 Days Discharge Instructions: May shower and wash wound with dial antibacterial soap and water prior to dressing change. Peri-Wound Care: Sween Lotion (Moisturizing lotion) (Home Health) 3 x Per Week/30 Days Discharge Instructions: Apply moisturizing lotion to dry skin on legs Prim Dressing: Hydrofera Blue Ready Foam, 4x5 in 3 x Per Week/30 Days ary Discharge Instructions: Apply to wound bed as instructed Secondary Dressing: ABD Pad, 8x10 (Home Health) 3 x Per Week/30 Days Discharge Instructions: Apply over primary dressing as directed. Secondary Dressing: Zetuvit Plus 4x4 in (Home Health) 3 x Per Week/30 Days Discharge Instructions: or equivalent extra absorbent pad.Apply over primary dressing as needed. Com pression Wrap: ThreePress (3 layer compression wrap) (Home Health) 3 x Per Week/30 Days Discharge Instructions: Apply three layer compression as directed. Pad bend of ankle with foam or ABD pad. WOUND #64: - Foot Wound Laterality: Dorsal, Left Cleanser: Soap and Water 3 x Per Week/30 Days Discharge Instructions: May shower and wash wound with dial antibacterial soap and water prior to dressing change. Peri-Wound Care: Sween Lotion (Moisturizing lotion) (Home Health) 3 x Per Week/30 Days Discharge Instructions: Apply moisturizing lotion to dry skin on legs Prim Dressing: Hydrofera Blue Ready Foam, 4x5 in 3 x Per Week/30 Days ary Discharge Instructions: Apply to wound bed as instructed Secondary Dressing: ABD Pad, 8x10 (Home Health) 3 x Per Week/30 Days Discharge Instructions: Apply over primary dressing as directed. Secondary Dressing: Zetuvit Plus 4x4 in (Home Health) 3 x Per Week/30 Days Discharge Instructions: or equivalent extra absorbent pad.Apply over  primary dressing as needed. Com pression Wrap: ThreePress (3 layer compression wrap) (Home  Health) 3 x Per Week/30 Days Discharge Instructions: Apply three layer compression as directed. Pad bend of ankle with foam or ABD pad. WOUND #67: - Lower Leg Wound Laterality: Right, Medial Cleanser: Soap and Water 3 x Per Week/30 Days Discharge Instructions: May shower and wash wound with dial antibacterial soap and water prior to dressing change. Peri-Wound Care: Sween Lotion (Moisturizing lotion) (Home Health) 3 x Per Week/30 Days Discharge Instructions: Apply moisturizing lotion to dry skin on legs Prim Dressing: Hydrofera Blue Ready Foam, 4x5 in 3 x Per Week/30 Days ary Discharge Instructions: Apply to wound bed as instructed Secondary Dressing: ABD Pad, 8x10 (Home Health) 3 x Per Week/30 Days Discharge Instructions: Apply over primary dressing as directed. Secondary Dressing: Zetuvit Plus 4x4 in (Home Health) 3 x Per Week/30 Days Discharge Instructions: or equivalent extra absorbent pad.Apply over primary dressing as needed. Com pression Wrap: ThreePress (3 layer compression wrap) (Home Health) 3 x Per Week/30 Days Discharge Instructions: Apply three layer compression as directed. Pad bend of ankle with foam or ABD pad. WOUND #71: - Lower Leg Wound Laterality: Right, Posterior Cleanser: Soap and Water 3 x Per Week/30 Days Discharge Instructions: May shower and wash wound with dial antibacterial soap and water prior to dressing change. Peri-Wound Care: Sween Lotion (Moisturizing lotion) (Home Health) 3 x Per Week/30 Days Discharge Instructions: Apply moisturizing lotion to dry skin on legs Prim Dressing: Hydrofera Blue Ready Foam, 4x5 in 3 x Per Week/30 Days ary Discharge Instructions: Apply to wound bed as instructed Secondary Dressing: ABD Pad, 8x10 (Home Health) 3 x Per Week/30 Days Discharge Instructions: Apply over primary dressing as directed. Secondary Dressing: Zetuvit Plus 4x4 in (Home Health) 3 x Per Week/30 Days Discharge Instructions: or equivalent extra absorbent  pad.Apply over primary dressing as needed. Com pression Wrap: ThreePress (3 layer compression wrap) (Home Health) 3 x Per Week/30 Days Discharge Instructions: Apply three layer compression as directed. Pad bend of ankle with foam or ABD pad. WOUND #72: - Foot Wound Laterality: Dorsal, Right Cleanser: Soap and Water 3 x Per Week/30 Days Discharge Instructions: May shower and wash wound with dial antibacterial soap and water prior to dressing change. Peri-Wound Care: Sween Lotion (Moisturizing lotion) (Home Health) 3 x Per Week/30 Days Discharge Instructions: Apply moisturizing lotion to dry skin on legs Prim Dressing: Hydrofera Blue Ready Foam, 4x5 in 3 x Per Week/30 Days ary Discharge Instructions: Apply to wound bed as instructed Secondary Dressing: ABD Pad, 8x10 (Home Health) 3 x Per Week/30 Days Discharge Instructions: Apply over primary dressing as directed. Secondary Dressing: Zetuvit Plus 4x4 in (Home Health) 3 x Per Week/30 Days Discharge Instructions: or equivalent extra absorbent pad.Apply over primary dressing as needed. Com pression Wrap: ThreePress (3 layer compression wrap) (Home Health) 3 x Per Week/30 Days Discharge Instructions: Apply three layer compression as directed. Pad bend of ankle with foam or ABD pad. 1. Based on what I am seeing currently my suggestion is going to be that we go ahead and initiate a continue treatment here with compression wrapping. Were using the 3 layer compression wrap bilaterally. 2. With regard to the dressing I would like to try Hydrofera Blue to see if this will be of benefit for her this is a change from the alginate we use for some time. 3. I am also can recommend an ABD pad followed by Zetuvit to help with controlling the drainage. 4. I am also  can recommend at this time that we have the patient continue with a rather start using her lymphedema pumps. Again I have done pretty much everything I can do to try to help her out and to be honest the  noncompliance is really killing her chances of being able to achieve the goal of healing that she obviously wants. I told her that if her husband wants to get home health out of the picture as and not have them come into the home anymore which she has expressed a concern about I completely understand that. Nonetheless right now she absolutely needs home health services to help [place] out with what is going on currently I need them to change the dressing on Monday and Friday we can do it on Wednesdays but this needs to be done 3 times a week. If were doing this she needs to be keeping the dressing including the wrap in place for the entire time. If she does not keep the wraps in place and in turn does not use her lymphedema pumps I am going to discuss with her going forward that we will refer her elsewhere but that I cannot continue to see her in the setting of extreme noncompliance. Right now I have not broached this topic specifically with her but that will occur next week if she is not doing what she needs to be doing. We will see patient back for reevaluation in 1 week here in the clinic. If anything worsens or changes patient will contact our office for additional recommendations. Electronic Signature(s) Signed: 03/15/2021 4:20:26 PM By: [name] PA-C Entered By: [name] on 03/15/2021 16:20:25 -------------------------------------------------------------------------------- SuperBill Details Patient Name: Date of Service: [name] 03/13/2021 Medical Record Number: [id number] Patient Account Number: [id number] Date of Birth/Sex: Treating RN: 04/02/1951 (70 y.o. 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 ICD-10 Diagnosis Description L97.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 leg with fat layer  exposed L97.822 Non-pressure chronic ulcer of other part of left lower leg with fat layer exposed I89.0 Lymphedema, not elsewhere classified Physician Procedures : CPT4 Code Description Modifier [id number] - WC PHYS LEVEL 4 - EST PT ICD-10 Diagnosis Description E11.622 Type 2 diabetes mellitus with other skin ulcer I89.0 Lymphedema, not elsewhere classified I87.331 Chronic venous hypertension (idiopathic) with  ulcer and inflammation of right lower extremity I87.332 Chronic venous hypertension (idiopathic) with ulcer and inflammation of left lower extremity Quantity: 1 Electronic Signature(s) Signed: 03/15/2021 4:22:24 PM By: [name] PA-C Previous Signature: 03/13/2021 5:34:22 PM  Version By: Lorrin Jackson Entered By: [name] on 03/15/2021 16:22:21

## 2021-03-14 ENCOUNTER — Ambulatory Visit: Payer: Medicare PPO | Admitting: Infectious Diseases

## 2021-03-20 ENCOUNTER — Encounter (HOSPITAL_BASED_OUTPATIENT_CLINIC_OR_DEPARTMENT_OTHER): Payer: Medicare PPO | Attending: Physician Assistant | Admitting: Physician Assistant

## 2021-03-20 ENCOUNTER — Other Ambulatory Visit: Payer: Self-pay

## 2021-03-20 DIAGNOSIS — L97522 Non-pressure chronic ulcer of other part of left foot with fat layer exposed: Secondary | ICD-10-CM | POA: Diagnosis not present

## 2021-03-20 DIAGNOSIS — E11622 Type 2 diabetes mellitus with other skin ulcer: Secondary | ICD-10-CM | POA: Insufficient documentation

## 2021-03-20 DIAGNOSIS — R531 Weakness: Secondary | ICD-10-CM | POA: Diagnosis not present

## 2021-03-20 DIAGNOSIS — L97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 leg with fat layer exposed: Secondary | ICD-10-CM | POA: Insufficient documentation

## 2021-03-20 DIAGNOSIS — F32A Depression, unspecified: Secondary | ICD-10-CM | POA: Insufficient documentation

## 2021-03-20 DIAGNOSIS — I872 Venous insufficiency (chronic) (peripheral): Secondary | ICD-10-CM | POA: Diagnosis not present

## 2021-03-20 DIAGNOSIS — K449 Diaphragmatic hernia without obstruction or gangrene: Secondary | ICD-10-CM | POA: Insufficient documentation

## 2021-03-20 DIAGNOSIS — I87333 Chronic venous hypertension (idiopathic) with ulcer and inflammation of bilateral lower extremity: Secondary | ICD-10-CM | POA: Diagnosis not present

## 2021-03-20 DIAGNOSIS — I10 Essential (primary) hypertension: Secondary | ICD-10-CM | POA: Insufficient documentation

## 2021-03-20 DIAGNOSIS — M109 Gout, unspecified: Secondary | ICD-10-CM | POA: Diagnosis not present

## 2021-03-20 DIAGNOSIS — L97822 Non-pressure chronic ulcer of other part of left lower leg with fat layer exposed: Secondary | ICD-10-CM | POA: Diagnosis not present

## 2021-03-20 DIAGNOSIS — F418 Other specified anxiety disorders: Secondary | ICD-10-CM | POA: Diagnosis not present

## 2021-03-20 DIAGNOSIS — I890 Lymphedema, not elsewhere classified: Secondary | ICD-10-CM | POA: Diagnosis not present

## 2021-03-20 NOTE — Progress Notes (Addendum)
[name], [name] ([id number]) Visit Report for 03/20/2021 Chief Complaint Document Details Patient Name: Date of Service: [name], [name] 03/20/2021 1:45 PM Medical Record Number: [id number] Patient Account Number: [id number] Date of Birth/Sex: Treating RN: Feb 15, 1951 (70 y.o. [name] Primary Care Provider: [name] Other Clinician: Referring Provider: Treating Provider/Extender: Sallye Ober, Loree Fee Weeks in Treatment: [place] from: Patient Chief Complaint Bilateral reoccurring LE ulcers Electronic Signature(s) Signed: 03/20/2021 2:25:03 PM By: [name] PA-C Entered By: [name] on 03/20/2021 14:25:03 -------------------------------------------------------------------------------- HPI Details Patient Name: Date of Service: [name]. 03/20/2021 1:45 PM Medical Record Number: [id number] Patient Account Number: [id number] Date of Birth/Sex: Treating RN: 04/05/1951 (70 y.o. [name] Primary Care Provider: [name] Other Clinician: Referring Provider: Treating Provider/Extender: Sallye Ober, Loree Fee Weeks in Treatment: 189 History of Present Illness HPI Description: this patient has been seen a couple of times before and returns with recurrent problems to her right and left lower extremity with swelling and weeping ulcerations due to not wearing her compression stockings which she had been advised to do during her last discharge, at the end of June 2018. During her last visit the patient had had normal arterial blood flow and her venous reflux study did not necessitate any surgical intervention. She was recommended compression and elevation and wound care. After prolonged treatment the patient was completely healed but she has been noncompliant with wearing or compressions.. She was here last week with an outpatient return visit planned but the patient came in a very poor general condition with  altered mental status and was rushed to the ER on my request. With a history of hypertension, diabetes, TIA and right-sided weakness she was set up for an MRI on her brain and cervical spine and was sent to [facility]. Getting an MRI done was very difficult but once the workup was done she was found not to have any spinal stenosis, epidural abscess or hematoma or discitis. This was radiculopathy to be treated as an outpatient and she was given a follow-up appointment. Today she is feeling much better alert and oriented and has come to reevaluate her bilateral lower extremity lymphedema and ulceration 03/25/2017 -- she was admitted to the hospital on 03/16/2017 and discharged on 03/18/2017 with left leg cellulitis and ulceration. She was started on vancomycin and Zosyn and x-ray showed no bony involvement. She was treated for a cellulitis with IV antibiotics changed to Rocephin and Flagyl and was discharged on oral Keflex and doxycycline to complete a 7 day course. Last hemoglobin A1c was 7.1 and her other ailments including hypertension got asthma were appropriately treated. 05/06/2017 -- she is awaiting the right size of compression stockings from [place] but other than that has been doing well. ====== Old notes 70 year old patient was seen one time last October and was lost to follow-up. She has recurrent problems with weeping and ulceration of her left lower extremity and has swelling of this for several years. It has been worse for the last 2 months. Past medical history is significant for diabetes mellitus type 2, hypertension, gout, morbid obesity, depressive disorders, hiatal hernia, migraines, status post knee surgery, risk of a cholecystectomy, vaginal hysterectomy and breast biopsy. She is not a smoker. As noted before she has never had a venous duplex study and an arterial ABI study was attempted but the left lower extremity was noncompressible 10/01/2016 -- had a lower extremity venous  duplex reflux evaluation which showed no evidence  of deep vein reflux in the right or left lower extremity, and no evidence of great saphenous vein reflux more than 500 ms in the right or left lower extremity, and the left small saphenous vein is incompetent but no vascular consult was recommended. review of her electronic medical records noted that the ABI was checked in July 2017 where the right ABI was normal limits and the left ABI could not be ascertained due to pain with cuff pressure but the waveforms are within normal limits. her arterial duplex study scheduled for April 27. 10/08/2016 -- the patient has various reasons for not having a compression on and for the last 3 days she has had no compression on her left lower extremity either due to pain or the lack of nursing help. She does not use her juxta lites either. 10/15/2016 -- the patient did not keep her appointment for arterial duplex study on April 27 and I have asked her to reschedule this. Her pain is out of proportion with the physical findings and she continuously fails to wear a compression wraps and cuts them off because she says she cannot tolerate the pain. She does not use her juxta lites either. 10/22/2016 -- he has rescheduled her arterial duplex study to May 21 and her pain today is a bit better. She has not been wearing her juxta lites on her right lower extremity but now understands that she needs to do this. She did tolerate the to press compression wrap on her left lower extremity 10/29/2016 --arterial duplex study is scheduled for next week and overall she has been tolerating her compression wraps and also using her juxta lites on her right lower extremity 11/05/2016 -- the right ABI was 0.95 the left was 1.03. The digit TBI is on the right was 0.83 on the left was 0.92 and she had biphasic flow through these vessels. The impression was that of normal lower extremity arterial study. 11/12/2016 -- her pain is minimal  and she is doing very well overall. 11/26/2016 -- she has got juxta lites and her insurance will not pay for additional dual layer compression stockings. She is going to order some from [place]. 05/12/2017 -- her juxta lites are very old and too big for her and these have not been helping with compression. She did get 20-30 mm compression stockings from [place] but she and her husband are unable to put these on. I believe she will benefit from bilateral Extremit-ease, compression stockings and we will measure her for these today. 05/20/2017 -- lymphedema on the left lower extremity has increased a lot and she has a open ulceration as a result of this. The right lower extremity is looking pretty good. She has decided to by the compression stockings herself and will get reimbursed by the home health, at a later date. 05/27/2017 -- her sciatica is bothering her a lot and she thought her left leg pain was caused due to the compression wrap and hence removed it and has significant lymphedema. There is no inflammation on this left lower extremity. 06/17/17 on evaluation today patient appears to be doing very well and in fact is completely healed in regard to her ulcerations. Unfortunately however she does have continued issues with lymphedema nonetheless. We did order compression garments for her unfortunately she states that the size that she received were large although we ordered medium. Obviously this means she is not getting the optimal compression. She does not have those with her today and therefore we could  not confirm and contact the company on her behalf. Nonetheless she does state that she is going to have her husband bring them by tomorrow so that we can verify and then get in touch with the company. No fevers, chills, nausea, or vomiting noted at this time. Overall patient is doing better otherwise and I'm pleased with the progress she has made. 07/01/17 on evaluation today patient appears to be  doing very well in regard to her bilateral lower extremity she does not have any openings at this point which is excellent news. Overall I'm pleased with how things have progressed up to this time. Since she is doing so well we did order her compression which we are seeing her today to ensure that it fits her properly and everything is doing well in that regard and then subsequently she will be discharged. ============ Old Notes: 03/31/16 patient presents today for evaluation concerning open wounds that she has over the left medial ankle region as well as the left dorsal foot. She has previously had this occur although it has been healed for a number of months after having this for about a year prior until her hospitalization on 01/05/16. At that point in time it appears that she was admitted to the hospital for left lower extremity cellulitis and was placed on vancomycin and Zosyn at that point. Eventually upon discharge on January 15, 2016 she was placed on doxycycline at that point in time. Later on 03/27/16 positive wound culture growing Escherichia coli this was switched to amoxicillin. Currently she tells me that she is having pain radiated to be a 7 out of 10 which can be as high as 10 out of 10 with palpation and manipulation of the wound. This wound appears to be mainly venous in nature due to the bilateral lower extremity venous stasis/lymphedema. This is definitely much worse on her left than the right side. She does have type 1 diabetes mellitus, hypertension, morbid obesity, and is wheelchair dependent.during the course of the hospital stay a blood culture was also obtained and fortunately appeared negative. She also had an x-ray of the tibia/fibula on the left which showed no acute bone abnormality. Her white blood cell count which was performed last on 03/25/16 was 7.3, hemoglobin 12.8, protein 7.1, albumin 3.0. Her urine culture appeared to be negative for any specific organisms. Patient  did have a left lower extremity venous duplex evaluation for DVT . This did not include venous reflux studies but fortunately was negative for DVT Patient also had arterial studies performed which revealed that she had a . normal ABI on the right though this was unable to be performed on the left secondary to pain that she was having around the ankle region due to the wound. However it was stated on report that she had biphasic pulses and apparently good blood flow. ========== 06/03/17 she is here in follow-up evaluation for right lower extremity ulcer. The right lower sure he has healed but she has reopened to the left medial malleolus and dorsal foot with weeping. She is waiting for new compression garments to arrive from home health, the previous compression garments were ill fitting. We will continue with compression bilaterally and follow-up in 2 weeks Readmission: 08/05/17 on evaluation today patient appears to be doing somewhat poorly in regard to her left lower extremity especially although the right lower extremity has a small area which may no longer be open. She has been having a lot of drainage from the left lower  extremity however he tells me that she has not been able to use the EXTREMIT-EASE Compression at this point. She states that she did better and was able to actually apply the Juxta-Lite compression although the wound that she has is too large and therefore really does not compress which is why she cannot wear it at this point. She has no one who can help her put it on regular basis her son can sometimes but he's not able to do it most of the time. I do believe that's why she has begun to weave and have issues as she is currently yet again. No fevers, chills, nausea, or vomiting noted at this time. Patient is no evidence of dementia. 08/12/17 on evaluation today patient appears to still be doing fairly well in regard to the draining areas/weeping areas at this point. With that  being said she unfortunately did go to the ER yesterday due to what was felt to be possibly a cellulitis. They place her on doxycycline by mouth and discharge her home. She definitely was not admitted. With that being said she states she has had more discomfort which has been unusual for her even compared to prior times and she's had infections.08/12/17 on evaluation today patient appears to still be doing fairly well in regard to the draining areas/weeping areas at this point. With that being said she unfortunately did go to the ER yesterday due to what was felt to be possibly a cellulitis. They place her on doxycycline by mouth and discharge her home. She definitely was not admitted. With that being said she states she has had more discomfort which has been unusual for her even compared to prior times and she's had infections. 08/19/17 put evaluation today patient tells me that she's been having a lot of what sounds to be neuropathic type pain in regard to her left lower extremity. She has been using over-the-counter topical bins again which some believe. That in order to apply the she actually remove the wrap we put on her last Wednesday on Thursday. Subsequently she has not had anything on compression wise since that time. The good news is a lot of the weeping areas appear to have closed at this point again I believe she would do better with compression but we are struggling to get her to actually use what she needs to at this point. No fevers, chills, nausea, or vomiting noted at this time. 09/03/17 on evaluation today patient appears to be doing okay in regard to her lower extremities in regard to the lymphedema and weeping. Fortunately she does not seem to show any signs of infection at this point she does have a little bit of weeping occurring in the right medial malleolus area. With that being said this does not appear to be too significant which is good news. 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 She has severe skin damage secondary to both of these features involving the dorsal left foot and medial left ankle and lower leg. Still has open areas in the left anterior foot. The area on the right closed over. She uses her own juxta light stockings. She does not have an arterial issue 09/16/17 on evaluation today patient actually appears to be doing excellent in regard to her bilateral lower extremity swelling. The Juxta-Lite compression wrap seem to be doing very well for her. She has not however been using the portion that goes over her foot. Her left  foot still is draining a little bit not nearly as significant as it has been in the past but still I do believe that she likely needs to utilize the full wrap including the foot portion of this will improve as well. She also has been apparently putting on a significant amount of Vaseline which also think is not helpful for her. I recommended that if she feels she needs something for moisturizer Eucerin will probably be better. 09/30/17 on evaluation today patient presents with several new open areas in regard to her left lower extremity although these appear to be minimal and mainly seem to be more moisture breakdown than anything. Fortunately she does not seem to have any evidence of infection which is great news. She has been tolerating the dressing changes without complication we are using silver alginate on the foot she has been using AB pads to have the legs and using her Juxta- Lite compression which seems to be controlling her swelling very well. Overall I'm pleased with the poor way she has progressed. 10/14/17 on evaluation today patient appears to be doing better in regard to her left lower extremity areas of weeping. She does still have some discomfort although in general this does not appear to be as macerated and I think it is progressing nicely. I do think she  still needs to wear the foot portion of her Juxta- Lite in order to get the most benefit from the wrap obviously. She states she understands. Fortunately there does not appear to be evidence of infection at this time which is great news. 10/28/17 on evaluation today patient appears to be doing excellent in regard to her left lower extremity. She has just a couple areas that are still open and seem to be causing any trouble whatsoever. For that reason I think that she is definitely headed in the right direction the spots are very tiny compared to what we have been dealing with in the past. 11/11/17 on evaluation today patient appears to have a right lateral lower extremity ulcer that has opened since I last saw her. She states this is where the home health nurse that was coming out remove the dressing without wetting the alginate first. Nonetheless I do not know if this is indeed the case or not but more importantly we have not ordered home help to be coming out for her wounds at all. I'm unsure as to why they are coming out and we're gonna have to check on this and get things situated in that regard. With that being said we currently really do not need them to be coming out as the patient has been taking care of her leg herself without complication and no issues. In fact she was doing much better prior to nursing coming out. 11/25/17 on evaluation today patient actually appears to be doing fairly well in regard to her left lower extremity swelling. In fact she has very little area of weeping at this point there's just a small spot on the lateral portion of her right leg that still has me just a little bit more concerned as far as wanting to see this clear up before I discharge her to caring for this at home. Nonetheless overall she has made excellent progress. 12/09/17 on evaluation today patient appears to be doing rather well in regard to her lower extremity edema. She does have some weeping still in the  left lower extremity although the big area we were taking care of two weeks ago  actually has closed and she has another area of weeping on the left lower extremity immediately as well is the top of her foot. She does not currently have lymphedema pumps she has been wearing her compression daily on a regular basis as directed. With that being said I think she may benefit from lymphedema pumps. 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 We had a very difficult time getting and keeping this under control. 12/23/17 on evaluation today patient actually appears to be doing a little bit more poorly in regard to her bilateral lower extremities. She has been tolerating the Juxta-Lite compression wraps. Unfortunately she has two new ulcers on the right lower extremity and left lower Trinity ulceration seems to be larger. Obviously this is not good news. She has been tolerating the dressings without complication. 12/30/17 on evaluation today patient actually appears to be doing much better in regard to her bilateral lower extremity edema. She continues to have some issues with ulcerations and in fact there appears to be one spot on each leg where the wrap may have caused a little bit of a blister which is subsequently opened up at this point is given her pain. Fortunately it does not appear to be any evidence of infection which is good news. No fevers chills noted. 01/13/18 on evaluation today patient appears to be doing rather well in regard to her bilateral lower extremities. The dressings did get kind of stuck as far as the wound beds are concerned but again I think this is mainly due to the fact that she actually seems to be showing signs of healing which is good news. She's not having as much drainage therefore she was having more of the dressing sticking.  Nonetheless overall I feel like her swelling is dramatically down compared to previous. 01/20/18 on evaluation today patient unfortunately though she's doing better in most regards has a large blister on the left anterior lower extremity where she is draining quite significantly. Subsequently this is going to need debridement today in order to see what's underneath and ensure she does not continue to trapping fluid at this location. Nonetheless No fevers, chills, nausea, or vomiting noted at this time. 01/27/18 on evaluation today patient appears to be doing rather well at this point in regard to her right lower extremity there's just a very small area that she still has open at this point. With that being said I do believe that she is tolerating the compression wraps very well in making good progress. Home health is coming out at this point to see her. Her left lower extremity on the lateral portion is actually what still mainly open and causing her some discomfort for the most part 02/10/18 on evaluation today patient actually appears to be doing very well in regard to her right lower extremity were all the ulcers appear to be completely close. In regard to the left lower extremity she does have two areas still open and some leaking from the dorsal surface of her foot but this still seems to be doing much better to me in general. 02/24/18 on evaluation today patient actually appears to be doing much better in regard to her right lower extremity this is still completely healed. Her left lower extremity is also doing much better fortunately she has no evidence of infection. The one area that  is gonna require some debridement is still on the left anterior shin. Fortunately this is not hurting her as badly today. 03/10/18 on evaluation today patient appears to be doing better in some regards although she has a little bit more open area on the dorsal foot and she also has some issues on the medial portion of  the left lower extremity which is actually new and somewhat deep. With that being said there fortunately does not appear to be any significant signs of infection which is good news. No fevers, chills, nausea, or vomiting noted at this time. In general her swelling seems to be doing fairly well which is good news. 03/31/18 on evaluation today patient presents for follow-up concerning her left lower extremity lymphedema. Unfortunately she has been doing a little bit more poorly since I last saw her in regard to the amount of weeping that she is experiencing. She's also having some increased pain in the anterior shin location. Unfortunately I do not feel like the patient is making such good progress at this point a few weeks back she was definitely doing much better. 04/07/18 on evaluation today patient actually appears to be showing some signs of improvement as far as the left lower extremity is concerned. She has been tolerating the dressing changes and it does appear that the Drawtex did better for her. With that being said unfortunately home health is stating that they cannot obtain the Drawtex going forward. Nonetheless we're gonna have to check and see what they may be able to get the alginate they were using was getting stuck in causing new areas of skin being pulled all that with and subsequently weep and calls her to worsen overall this is the first time we've seen improvement at this time. 04/14/18 on evaluation today patient actually appears to be doing rather well at this point there does not appear to be any evidence of infection at this time and she is actually doing excellent in regard to the weeping in fact she almost has no openings remaining even compared to just last week this is a dramatic improvement. No fevers chills noted 04/21/18 evaluation today patient actually appears to be doing very well. She in fact is has a small area on the posterior lower extremity location and she has  a small area on the dorsal surface of her foot that are still open both of which are very close to closing. We're hoping this will be close shortly. She brought her Juxta-Lite wrap with her today hoping that would be able to put her in it unfortunately I don't think were quite at that point yet but we're getting closer. 04/28/18 upon evaluation today patient actually appears to be doing excellent in regard to her left lower extremity ulcer. In fact the region on the posterior lower extremity actually is much smaller than previously noted. Overall I'm very happy with the progress she has made. She again did bring her Juxta-Lite although we're not quite ready for that yet. 05/11/18 upon evaluation today patient actually appears to be doing in general fairly well in regard to her left lower Trinity. The swelling is very well controlled. With that being said she has a new area on the left anterior lower extremity as well as between the first and second toes of her left foot that was not present during the last evaluation. The region of her posterior left lower extremity actually appears to be almost completely healed. T be honest I'm very pleased with o  the way that stands. Nonetheless I do believe that the lotion may be keeping the area to moist as far as her legs are concerned subsequently I'm gonna consider discontinuing that today. 05/26/18 on evaluation today patient appears to be doing rather well in regard to her left lower should be ulcers. In fact everything appears to be close except for a very small area on the left posterior lower extremity. Fortunately there does not appear to be any evidence of infection at this time. Overall very pleased with her progress. 06/02/18 and evaluation today patient actually appears to be doing very well in regard to her lower extremity ulcers. She has one small area that still continues to weep that I think may benefit her being able to justify lotion and user  Juxta-Lite wraps versus continued to wrap her. Nonetheless I think this is something we can definitely look into at this point. 06/23/18 on evaluation today patient unfortunately has openings of her bilateral lower extremities. In general she seems to be doing much worse than when I last saw her just as far as her overall health standpoint is concerned. She states that her discomfort is mainly due to neuropathy she's not having any other issues otherwise. No fevers, chills, nausea, or vomiting noted at this time. 06/30/18 on evaluation today patient actually appears to be doing a little worse in regard to her right lower extremity her left lower extremity of doing fairly well. Fortunately there is no sign of infection at this time. She has been tolerating the dressing changes without complication. Home health did not come out like they were supposed to for the appropriate wrap changes. They stated that they never received the orders from [place] which were fax. Nonetheless we will send a copy of the orders with the patient today as well. 07/07/18 on evaluation today patient appears to be doing much better in regard to lower extremities. She still has several openings bilaterally although since I last saw her her legs did show obvious signs of infection when she later saw her nurse. Subsequently a culture was obtained and she is been placed on Bactrim and Keflex. Fortunately things seem to be looking much better it does appear she likely had an infection. Again last week we'd even discussed it but again there really was not any obvious sign that she had infection therefore we held off on the antibiotics. Nonetheless I'm glad she's doing better today. 07/14/18 on evaluation today patient appears to be doing much better regarding her bilateral lower Trinity's. In fact on the right lower for me there's nothing open at this point there are some dry skin areas at the sites where she had infection. Fortunately there is  no evidence of systemic infection which is excellent news. No fevers chills noted 07/21/18 on evaluation today patient actually appears to be doing much better in regard to her left lower extremity ulcers. She is making good progress and overall I feel like she's improving each time I see her. She's having no pain I do feel like the infection is completely resolved which is excellent news. No fevers, chills, nausea, or vomiting noted at this time. 07/28/18 on evaluation today patient appears to be doing very well in regard to her left lower [employer]. Everything seems to be showing signs of improvement which is excellent news. Overall very pleased with the progress that has been made. Fortunately there's no evidence of active infection at this time also excellent news. 08/04/18 on evaluation today patient  appears to be doing more poorly in regard to her bilateral lower extremities. She has two new areas open up on the right and these were completely closed as of last week. She still has the two spots on the left which in my pinion seem to be doing better. Fortunately there's no evidence of infection again at this point. 08/11/18 on evaluation today patient actually appears to be doing very well in regard to her bilateral lower Trinity wounds that all seem to be doing better and are measures smaller today. Fortunately there's no signs of infection. No fevers, chills, nausea, or vomiting noted at this time. 08/18/18 on evaluation today patient actually appears to be doing about the same inverter bilateral lower extremities. She continues to have areas that blistering open as was drain that fortunately nothing too significant. Overall I feel like Drawtex may have done better for her however compared to the collagen. 08/25/18 on evaluation today patient appears to be doing a little bit more poorly today even compared to last time I saw her. Again I'm not exactly sure why she's making worse progress over  the past several weeks. I'm beginning to wonder if there is some kind of underlying low level infection causing this issue. I did actually take a culture from the left anterior lower extremity but it was a new wound draining quite a bit at this point. Unfortunately she also seems to be having more pain which is what also makes me worried about the possibility of infection. This is despite never erythema noted at this point. 09/01/18 on evaluation today patient actually appears to be doing a little worse even compared to last week in regard to bilateral lower extremities. She did go to the hospital on the 16th was given a dose of IV Zosyn and then discharged with a recommendation to continue with the Bactrim that I previously prescribed for her. Nonetheless she is still having a lot of discomfort she tells me as well at this time. This is definitely unfortunate. No fevers, chills, nausea, or vomiting noted at this time. 09/08/18 on evaluation today patient's bilateral lower extremities actually appear to be shown signs of improvement which is good news. Fortunately there does not appear to be any signs of active infection I think the anabiotic is helping in this regard. Overall I'm very pleased with how she is progressing. 09/15/18 patient was actually seen in ER yesterday due to her legs as well unfortunately. She states she's been having a lot of pain and discomfort as well as a lot of drainage. Upon inspection today the patient does have a lot of swelling and drainage I feel like this is more related to lymphedema and poor fluid control than it is to infection based on what I'm seeing. The physician in the emergency department also doubted that the patient was having a significant infection nonetheless I see no evidence of infection obvious at this point although I do see evidence of poor fluid control. She still not using a compression pumps, she is not elevating due to her lift chair as well as her  hospital bed being broken, and she really is not keeping her legs up as much as they should be and also has been taking off her wraps. All this combined I think has led to poor fluid control and to be honest she may be somewhat volume overloaded in general as well. I recommend that she may need to contact your physician to see if a prescription for  a diuretic would be beneficial in their opinion. As long as this is safe I think it would likely help her. 09/29/18 on evaluation today patient's left lower extremity actually appears to be doing quite a bit better. At least compared to last time that I saw her. She still has a large area where she is draining from but there's a lot of new skin speckled trout and in fact there's more new skin that there are open areas of weeping and drainage at this point. This is good news. With regard to the right lower extremity this is doing much better with the only open area that I really see being a dry spot on the right lateral ankle currently. Fortunately there's no signs of active infection at this time which is good news. No fevers, chills, nausea, or vomiting noted at this time. The patient seems somewhat stressed and overwhelmed during the visit today she was very lethargic as such. She does and she is not taking any pain medications at this point. Apparently according to her husband are also in the process of moving which is probably taking its toll on her as well. 10/06/18 on evaluation today patient appears to be doing rather well in regard to her lower extremities compared to last evaluation. Fortunately there's no signs of active infection. She tells me she did have an appointment with her primary. Nonetheless he was concerned that the wounds were somewhat deep based on pictures but we never actually saw her legs. She states that he had her somewhat worried due to the fact that she was fearing now that she was [place] have to have an amputation. With that being  said based on what I'm seeing check she looks better this week that she has the last two times I've seen her with much less drainage I'm actually pleased in this regard. That doesn't mean that she's out of the water but again I do not think what the point of talking about education at all in regard to her leg. She is very happy to hear this. She is also not having as much pain as she was having last week. 10/13/18 unfortunately on evaluation today patient still continues to have a significant amount of drainage she's not letting home health actually apply the compression dressings at this point. She's trying to use of Juxta-Lite of the top of Kerlex and the second layer of the three layer compression wrap. With that being said she just does not seem to be making as good a progress as I would expect if she was having the compression applied and in place on a regular basis. No fevers, chills, nausea, or vomiting noted at this time. 10/20/18 on evaluation today patient appears to be doing a little better in regard to her bilateral lower extremity ulcers. In fact the right lower extremity seems to be healed she doesn't even have any openings at this point left lower extremity though still somewhat macerated seems to be showing signs of new skin growth at multiple locations throughout. Fortunately there's no evidence of active infection at this time. No fevers, chills, nausea, or vomiting noted at this time. 10/27/18 on evaluation today patient appears to be doing much better in regard to her left lower Trinity ulcer. She's been tolerating the laptop complication and has minimal drainage noted at this point. Fortunately there's no signs of active infection at this time. No fevers, chills, nausea, or vomiting noted at this time. 11/03/18 on evaluation today patient actually  appears to be doing excellent in regard to her left lower extremity. She is having very little drainage at this point there does not appear  to be any significant signs of infection overall very pleased with how things have gone. She is likewise extremely pleased still and seems to be making wonderful progress week to week. I do believe antibiotics were helpful for her. Her primary care provider did place on amateur clean since I last saw her. 11/17/18 on evaluation today patient appears to be doing worse in regard to her bilateral lower extremities at this point. She is been tolerating the dressing changes without complication. With that being said she typically takes the Coban off fairly quickly upon arriving home even after being seen here in the clinic and does not allow home health reapply command as part of the dressing at home. Therefore she said no compression essentially since I last saw her as best I can tell. With that being said I think it shows and how much swelling she has in the open wounds that are noted at this point. Fortunately there's no signs of infection but unfortunately if she doesn't get this under control I think she will end up with infection and more significant issues. 11/24/18 on evaluation today patient actually appears to be doing somewhat better in regard to her bilateral lower extremities. 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 Nonetheless that is doing better currently so she should be able to attempt reinitiating the lymphedema pumps at this time. No fevers, chills, nausea, or vomiting noted at this time. 12/01/18 upon evaluation today patient's left lower extremity appears to be doing quite well unfortunately her right lower extremity is not doing nearly as well. She has been tolerating the dressing changes without complication unfortunately she did not keep a wrap on the right at this time. Nonetheless I believe this has led to increased swelling and weeping in  the world is actually much larger than during the last evaluation with her. 12/08/18 on evaluation today patient appears to be doing about the same at this point in regard to her right lower extremity. There is some more palatable to touch I'm concerned about the possibility of there being some infection although I think the main issue is she's not keeping her compression wrap on which in turn is not allowing this area to heal appropriately. 12/22/18 on evaluation today patient appears to be doing better in regard to left lower extremity unfortunately significantly worse in regard to the right lower extremity. The areas of blistering and necrotic superficial tissue have spread and again this does not really appear to be signs of infection and all she just doesn't seem to be doing nearly as well is what she has been in the past. Overall I feel like the Augmentin did absolutely nothing for her she doesn't seem to have any infection again I really didn't think so last time either is more of a potential preventative measure and hoping that this would make some difference but I think the main issue is she's not wearing her compression. She tells me she cannot wear the [place] been we put on she takes it off pretty much upon getting home. Subsequently she worshiped Juxta-Lite when I questioned her about how often she wears it this is no more than  three hours a day obviously that leaves 21 hours that she has no compression and this is obviously not doing well for her. Overall I'm concerned that if things continue to worsen she is at great risk of both infection as well as losing her leg. 01/05/19 on evaluation today patient appears to be doing well in regard to her left lower extremity which he is allowing [place] to wrap and not so well with regard to her right lower extremity which she is not allowing [place] to really wrap and keep the wrap on. She states that it hurts too badly whenever it's wrapped and she ends up  having to take it off. She's been using the Juxta-Lite she tells me up to six hours a day although I question whether or not that's really been the case to be honest. Previously she told me three hours today nonetheless obviously the legs as long as the wrap is doing great when she is not is doing much more poorly. 01/12/2019 on evaluation today patient actually appears to be doing a little better in my opinion with regard to her right lower extremity ulcer. She has a small open area on the left lower extremity unfortunately but again this I think is part of the normal fluctuation of what she is going to have to expect with regard to her legs especially when she is not using her lymphedema pumps on a regular basis. Subsequently based on what I am seeing today I think that she does seem to be doing slightly better with regard to her right lower extremity she did see her primary care provider on Monday they felt she had an infection and placed her on 2 antibiotics. Both Cipro and clindamycin. Subsequently again she seems possibly to be doing a little bit better in regards to the right lower extremity she also tells me however she has been wearing the compression wrap over the past week since I spoke with her as well that is a Kerlix and Coban wrap on the right. No fevers, chills, nausea, vomiting, or diarrhea. 01/19/2019 on evaluation today patient appears to be doing better with regard to her bilateral lower extremities especially the right. I feel like the compression has been beneficial for her which is great news. She did get a call from her primary care provider on her way here today telling her that she did have methicillin- resistant Staphylococcus aureus and he was calling in a couple new antibiotics for her including a ointment to be applied she tells me 3 times a day. With that being said this sounds like likely to be Bactroban which I think could be applied with each dressing/wrap change but I  would not be able to accommodate her applying this 3 times a day. She is in agreement with the least doing this we will add that to her orders today. 01/26/2019 on evaluation today patient actually appears to be doing much better with regard to her right lower extremity. Her left lower extremity is also doing quite well all things considering. Fortunately there is no evidence of active infection at this time. No fevers, chills, nausea, vomiting, or diarrhea. 02/02/2019 on evaluation today patient appears to be doing much better compared to her last evaluation. Little by little off like her right leg is returning more towards normal. There does not appear to be any signs of active infection and overall she seems to be doing quite well which is great news. I am very pleased in this  regard. No fevers, chills, nausea, vomiting, or diarrhea. 02/09/2019 upon evaluation today patient appears to be doing better with regard to her bilateral lower extremities. She has been tolerating the dressing changes without complication. Fortunately there is no signs of active infection at this time. No fevers, chills, nausea, vomiting, or diarrhea. 02/23/2019 on evaluation today patient actually appears to be doing quite well with regard to her bilateral lower extremities. She has been tolerating the dressing changes without complication. She is even used her pumps one time and states that she really felt like it felt good. With that being said she seems to be in good spirits and her legs appear to be doing excellent. 03/09/2019 on evaluation today patient appears to be doing well with regard to her right lower extremity there are no open wounds at this time she is having some discomfort but I feel like this is more neuropathy than anything. With regard to her left lower extremity she had several areas scattered around that she does have some weeping and drainage from but again overall she does not appear to be having any  significant issues and no evidence of infection at this time which is good news. 03/23/2019 on evaluation today patient appears to be doing well with regard to her right lower extremity which she tells me is still close she is using her juxta light here. Her left lower extremity she mainly just has an area on the foot which is still slightly draining although this also is doing great. Overall very pleased at this time. 04/06/2019 patient appears to be doing a little bit worse in regard to her left lower extremity upon evaluation today. She feels like this could be becoming infected again which she had issues with previous. Fortunately there is no signs of systemic infection but again this is always a struggle with her with her legs she will go from doing well to not so well in a very short amount of time. 04/20/2019 on evaluation today patient actually appears to be doing quite well with regard to her right lower extremity I do not see any signs of active infection at this time. Fortunately there is no fever chills noted. She is still taking the antibiotics which I prescribed for her at this point. In regard to the left lower extremity I do feel like some of these areas are better although again she still is having weeping from several locations at this time. 04/27/2019 on evaluation today patient appears to be doing about the same if not slightly worse in regard to her left lower extremity ulcers. She tells me when questioned that she has been sleeping in her Hoveround chair in fact she tells me she falls asleep without even knowing it. I think she is spending a whole lot of time in the chair and less time walking and moving around which is not good for her legs either. On top of that she is in a seated position which is also the worst position she is not really elevating her legs and she is also not using her lymphedema pumps. All this is good to contribute to worsening of her condition in  general. 05/18/2019 on evaluation today patient appears to be doing well with regard to her lower extremity on the right in fact this is showing no signs of any open wounds at this time. On the left she is continuing to have issues with areas that do drain. Some of the regions have healed and there are  couple areas that have reopened. She did go to the ER per the patient according to recommendations from the home health nurse due to what she was seen when she came out on 05/13/2019. Subsequently she felt like the patient needed to go to the hospital due to the fact that again she was having "milky white discharge" from her leg. Nonetheless she had and then was placed on doxycycline and subsequently seems to be doing better. 06/01/2019 upon evaluation today patient appears to be doing really in my opinion about the same. I do not see any signs of active infection which is good news. Overall she still has wounds over the bilateral lower extremities she has reopened on the right but this appears to be more of a crack where there is weeping/edema coming from the region. I do not see any evidence of infection at either site based on what I visualized today. 07/13/2019 upon evaluation today patient appears to be doing a little worse compared to last time I saw her. She since has been in the hospital from 06/21/2019 through 06/29/2019. This was secondary to having Covid. During that time they did apply lotion to her legs which unfortunately has caused her to develop a myriad of open wounds on her lower extremities. Her legs do appear to be doing better as far as the overall appearance is concerned but nonetheless she does have more open and weeping areas. 07/27/2019 upon evaluation today patient appears to be doing more poorly to be honest in regard to her left lower extremity in particular. There is no signs of systemic infection although I do believe she may have local infection. She notes she has been having  a lot of blue/green drainage which is consistent potentially with Pseudomonas. That may be something that we need to consider here as well. The doxycycline does not seem to have been helping. 08/03/2019 upon evaluation today patient appears to be doing a little better in my opinion compared to last week's evaluation. Her culture I did review today and she is on appropriate medications to help treat the Enterobacter that was noted. Overall I feel like that is good news. With that being said she is unfortunately continuing to have a lot of drainage and though it is doing better I still think she has a long ways to go to get things dried up in general. Fortunately there is no signs of systemic infection. 08/10/2019 upon evaluation today patient appears to be doing may be slightly better in regard to her left lower extremity the right lower extremity is doing much better. Fortunately there is no signs of infection right now which is good news. No fevers, chills, nausea, vomiting, or diarrhea. 08/24/2019 on evaluation today patient appears to be doing slightly better in regard to her lower extremities. The left lower extremity seems to be healed the right lower extremity is doing better though not completely healed as far as the openings are concerned. She has some generalized issues here with edema and weeping secondary to her lymphedema though again I do believe this is little bit drier compared to prior weeks evaluations. In general I am very pleased with how things seem to be progressing. No fevers, chills, nausea, vomiting, or diarrhea. 08/31/2019 upon evaluation today patient actually seems to making some progress here with regard to the left lower extremity in particular. She has been tolerating the dressing changes without complication. Fortunately there is no signs of active infection at this time. No fevers, chills, nausea,  vomiting, or diarrhea. She did see Dr. Doren Custard and he did note that she did have  a issue with the left great saphenous vein and the small saphenous vein in the leg. With that being said he was concerned about the possibility of laser ablation not being extremely successful. He also mentioned a small risk of DVT associated with the procedure. However if the wounds do not continue to improve he stated that that would probably be the way to go. Fortunately the patient's legs do seem to be doing much better. 09/07/2019 upon evaluation today patient appears to be doing better with regard to her lower extremities. She has been tolerating the dressing changes without complication. With that being said she is showing signs of improvement and overall very pleased. There are some areas on her leg that I think we do need to debride we discussed this last week the patient is in agreement with doing that as long as it does not hurt too badly. 09/14/2019 upon evaluation today patient appears to be doing decently well with regard to her left lower extremity. She is not having near as much weeping as she has had in the past things seem to be drying up which is good news. There is no signs of active infection at this time. 09/21/19 upon evaluation today patient appears to be doing better in regard overall to her bilateral lower extremities. She again has less open than she did previous and each week I feel like this is getting better. Fortunately there is no signs of active infection at this time. No fevers, chills, nausea, vomiting, or diarrhea. 09/28/2019 upon evaluation today patient actually appears to be showing signs of improvement with regard to her left lower extremity. Unfortunately the right medial lower extremity around the ankle region has reopened to some degree but this appears to be minimal still which is good news. There is no signs of active infection at this time which is also good news. 10/12/2019 upon evaluation today patient appears to be doing okay with regard to her bilateral  lower extremities today. The right is a little bit worse then last evaluation 2 weeks ago. The left is actually doing a little better in my opinion. Overall there is no signs of active infection at this time that I see. Obviously that something we have to keep a close eye on she is very prone to this with the significant and multiple openings that she has over the bilateral lower extremities. 10/19/2019 upon evaluation today patient appears to be doing about the best that I have seen her in quite some time. She has been tolerating the dressing changes without complication. There does not appear to be any signs of active infection and overall I am extremely happy with the way her legs appeared. She is drying up quite nicely and overall is having less pain. 11/09/2019 upon evaluation today patient appears to be doing better in regard to her wounds. She seems to be drying up more and more each time I see her this is just taking a very long time. Fortunately there is no signs of active infection at this time. 11/23/2019 upon evaluation today patient actually appears to be doing excellent in regard to her lower extremities at this point compared to where she has been. Fortunately there is no signs of active infection at this time. She did go to the hospital last week for nausea and vomiting completely unrelated to her wounds. Fortunately she is doing better she was  given some Reglan and got better. She had associated abdominal pain but they never found out what was going on. 12/07/2019 upon evaluation today patient appears to be doing well for the most part in regard to her legs. She unfortunately has not been keeping the Coban portion of her wraps on therefore the compression has not really been sufficient for what it is supposed to be. Nonetheless she tells me that it just hurt too bad therefore she removed it. 12/21/2019 upon evaluation today patient actually appears to be doing quite well with regard to her  legs. I do feel like she has been making progress which is great news and overall there is no signs of active infection at this time. No fevers, chills, nausea, vomiting, or diarrhea. 01/04/2020 upon evaluation today patient presents for follow-up concerning her lower extremity edema bilaterally. She still has open wounds she has not been using her lymphedema pumps. She is also not been utilizing her compression wraps appropriately she tends to unwrap them, take them off, or states that they hurt. Obviously the reason they hurt is because her legs start to swell but the issue is if she would use her compression/lymphedema pumps regularly she would not swell and she would have the pain. Nonetheless she has not even picked them up once honestly over the past several months and may be even as much as in the past year based on my opinion and what have seen. She tells me today that after last week when I talked about this with her specifically actually that was 2 weeks ago that she "forgot". 8//21 on evaluation today patient appears to be doing a little better in regard to her legs bilaterally. Fortunately there is no signs of active infection at this time. She tells me that she used her lymphedema pumps all of one time over the past 2 weeks since I last saw her. She tells me that she has been too busy in order to continue to use these. 02/01/2020 on evaluation today patient appears to be doing some better in regard to her wounds in general in her legs. We felt the right was healed although is not completely it does appear to be doing better she tells me she has been using her lymphedema pumps that she has had this six times since I last saw her. Obviously the more she does that the better she would do my opinion 02/15/2020 upon evaluation today patient appears to be doing about the same in regard to her legs. She tells me that she is pumping I'm still not sure how much she does to be perfectly honest. However  even if she does a little bit here and there I guess that is better than nothing. Fortunately there is no sign of active infection at this time which is great news. No fevers, chills, nausea, vomiting, or diarrhea. 02/29/2020 on evaluation today patient actually appears to be doing quite well all things considered this week. She has been tolerating the dressing changes without complication. Fortunately there is no signs of active infection at this time. No fevers, chills, nausea, vomiting, or diarrhea. 03/14/2020 upon evaluation today patient appears to be doing really about the same in regard to her legs. There is no signs of improvement overall and she as noted from home health does not appear to be elevating her legs he can get into her lift chair. There is too much stuff piled up on it the patient tells me. She also tells me  she cannot really use her pumps effectively due to the fact that she cannot have any space to get them on. Finally she is also not really elevating her legs because she is not sleeping in her bed she is sleeping in her chair currently and again overall I think everything that she is done in combination has been exactly the wrong thing for what she needs for her legs. 04/04/2020 upon evaluation today patient appears to be doing well at this time with regard to her legs. She is actually been pumping, keeping her wraps on, and to be honest she seems to be doing dramatically better the right leg is excellent the left leg is also excellent and measuring much smaller than previous. 04/18/2020 upon evaluation today patient actually is continue to make good progress in regard to her lower extremities bilaterally. Everything is improving and less wet that has been in the past overall I am extremely pleased with where things stand and I think that she is making great progress. The patient tells me she still continue to use her compression pumps 05/02/2020 on evaluation today patient appears  to be doing well at this time in regard to her left leg which is showing signs of drying up. With that being said she does have a lot of lymphedema type crusty skin around the toes of her left foot and the right medial ankle which has opened at this point. Fortunately there is no signs of active infection systemically at this point or even locally for that matter. 05/23/2020 on evaluation today patient appears to be doing well with regard to her lower extremities. Fortunately there is no signs of active infection at this time. No fever chills noted. She has been very depressed however she tells me. 06/06/2020 patient came in today for evaluation in regard to her bilateral lower extremity ulcerations. With that being said she came in feeling okay and actually laughing and joking around with the staff checking her in. Subsequently however she had a coughing spell and following the coughing spell it was a dramatic conversion from being jovial and joking around to being extremely short of breath her vital signs actually dropped in regard to her blood pressure from around 175 to down around 867 for systolic and from around 96 diastolic down to around 70. With that being said she also accompanied this with an increase in her respiratory rate which was also quite significant. Nonetheless I actually upon going into see her was extremely worried we called EMS to have her transported to the ER for further evaluation and treatment. She continued until EMS got here to be extremely short of breath even on 6 L of oxygen. Her oxygen saturation did come up to 99% but overall it was only 95 before which was not terrible she did feel like the oxygen helped her feel somewhat better however. She has a history of asthma but again even the coughing spell really should not have done this degree of alteration in her demeanor from where she was just before to after the coughing spell. She tells [place] however she has been feeling  somewhat abnormal since Friday although she could not really pinpoint exactly what was going on. 06/27/2020 plan evaluation today patient appears to be doing okay in regard to her leg ulcers. She is really not showing a lot of improvement to be honest and she still has a lot of weeping. With that being said after I last saw her we called EMS to  take her to the hospital she actually did not go she went to see her primary care provider who according to the patient have not identified and read through the note states that everything checked out okay. Nonetheless the patient has continued to have bouts where she gets very short of breath for seemingly no reason whatsoever. That happened even the same night that I saw her last time. This was after she refused transport to the hospital. With that being said she also tells me that just getting dressed to come here is quite a chore which is putting on her shirt causing her to have to stop to catch her breath. Obviously this is not normal and I feel like there is something going on. She may need a referral to be seen by cardiology ASAP. She does see her primary care provider early next week she tells me 07/11/2020 upon evaluation today patient's wounds again appear to be doing about the same she mainly has weeping of the bilateral lower extremities both are very swollen today she tells me she has been using her pumps but last time I saw her she told me she did not even have her pumps out that she had "packed them away because they had too much stuff in their house. Apparently her husband has been bringing stuff in from storage units. She then subsequently told me that she had so much stuff in her house that she has been staying up all night and did not go to bed till 7:00 this morning because she was cleaning up stuff. Nonetheless I am still unsure as to whether or not she is using the pumps based on what I am seeing I would think probably not. Nonetheless she  has been keeping the compression wraps in place that is at least something 07/25/2020 upon evaluation today patient appears to be doing well currently in regard to her legs all things considered. I do not think she is doing any worse significantly although honestly I do not think she is doing a lot better either. There does not appear to be any signs of infection which is good news although she does have a lot of weeping. She is using her pumps she tells me sporadically though she says that her husband is not a big fan of helping her to get the [place]. She also tells me is difficult because in their house. It would be easiest for her to use these as where her son is actually staying with her and living out at this point. Obviously I understand that she is having a lot of issues here and to be honest I do not really know what to say in that regard and the fact that I really cannot change the situations but I do believe she really needs to be using the lymphedema pumps to try to keep things under control here. Outside of that I think she is going to continue to have significant issues. I think the pumps coupled with good compression and elevation are the only things that are to help her at this point. 08/08/2020 upon evaluation today patient appears to be doing a little worse in regard to her legs in general. I think this may be due to some infection currently. With that being said I am going to go ahead and likely see about putting her on an antibiotic. Were also can obtain a culture today where she had some purulent drainage. 08/29/2020 upon evaluation today patient appears to be doing  about the same in regard to her bilateral lower extremities. Unfortunately she is continuing to have significant issues here with edema and she has significant lymphedema. With that being said she is really not doing anything that she is supposed to be doing as far as elevation, compression, using lymphedema pumps or  anything really for that matter. She does not use her pumps, is taken the wraps off shortly after having them put on as far as home health is concerned at least by the next day she tells me, and overall is not really elevating her legs she is telling me she has a lot to do as far as cleaning up in her house. Nonetheless this triad of noncompliance is leading to worsening not improving in regard to her legs. I had a very strong conversation with her about this today again. 09/12/2020 on evaluation today patient appears to be doing poorly with regard to her lower extremities bilaterally. Fortunately there does not appear to be any signs of active infection at this point. That is systemically. Locally there is some signs of pus coming from an area on her left leg. She has been on antibiotics recently though she is done with those currently. Nonetheless she tells me she has been having increased pain with the right leg this is extremely swollen as well. Fortunately there does not appear to be significant infection of the right leg though I think her pain is really as result of the increased swelling she has been declining the wrap on the right leg and the wounds here are getting significantly worse week by week. This obviously is not will be want to see. I discussed with the patient however if she does not use the wraps, use her pumps, and elevate her legs that she is not to get better. She has been sleeping in her motorized Hoveround which does not even allow her to elevate her legs at all. She tells me that she is also constantly "cleaning up as her husband told her that he wants to move back to [place]." Unfortunately this has been the narrative for the past several months in fact that she tells me that she has been having to clean up mass that was moved in from storage units into their home and that she does not really sleep and she never really elevates her legs. I explained to the patient today  that if she is not can I do any of this that there is really nothing that I can do to help her. 09/26/20 upon evaluation today patient appears to be doing about the same in regard to her wounds. Fortunately there is no signs of active infection at this time. No fever chills noted. She has been tolerating the dressing changes without complication which is good news. With that being said I do think that she unfortunately is still having a lot of pain but I think this is due to the swelling that really is not controlled and as I discussed with her last time she needs to be using her lymphedema pumps there is still in the box she has not even attempted to use those whatsoever. I am also not even certain she is taking her fluid pills as she states she may need to have "get a refill" she was seen with her husband at the appointment today. 10/24/2020 upon evaluation today patient appears to be doing decently well in regard to her legs all things considered. She has been tolerating the dressing  changes without complication. Fortunately there is no signs of active infection at this time. No fevers, chills, nausea, vomiting, or diarrhea. With that being said the patient does appear to have some excessive thickened skin buildup currently that has been require some sharp debridement to clear this away. I am hopeful if we do this we will be able to get some of this area to dry out this otherwise trapping fluid underneath. Her son is present during the office visit today. She tells me she still been doing a lot as far as cleaning up and going through boxes she tells me she has been up for a long time already this morning. 5/25; patient presents for 2-week follow-up. She had 3 layer compression wrap placed with calcium alginate underneath. She reports tolerating the wraps well. She has home health that changes the wrap 2-3 times a week. She denies signs of infection. She has almost finished her course of antibiotics. She  uses her lymphedema pumps once weekly. 11/21/2020 upon evaluation today patient appears to be doing well with regard to her legs all things considered. Fortunately there is no signs of active infection at this time which is great news I do not see any evidence of infection and overall I think that she is definitely improved compared to where things did previous. Nonetheless I do think that she may benefit from removing some of the thicker skin on the legs. Again we have attempted this in the past to some degree but I think that we might be able to more effectively do this of a clear some way and then potentially use a urea cream with salicylic acid to try to help clear some of this away. The patient is in agreement with the plan. 12/05/2020 upon evaluation today patient appears to be doing well with regard to her wounds in general. I feel like that things are somewhat improved the skin looking a little better. With that being said she still has significant lymphedema. She is not been using her lymphedema pumps and not really been elevating her legs. She also did have some maggots noted on the left leg she tells me that the "gnats are terrible and get all over her foot." With that being said I am concerned about the fact that the patient still is quite swollen even with the compression wraps I really feel like she needs to be using her pumps but I cannot talk her into doing this. 12/19/2020 upon evaluation today patient appears to be doing about the same in regard to her legs. Fortunately I do not see any signs of infection unfortunately she really seems to be very nonchalant about the fact that she has not been using her lymphedema pumps. She tells me that "I just forget". With that being said this needs to be something that is a priority for her I cannot count the number of times that have had this discussion with her and yet nothing seems to change. With that being said I think that is very unlikely she  is ever really getting healed with regard to her wounds to be honest she does not seem to be motivated to do anything to try to help at this point despite everything that I have tried to do and recommend for her. 01/02/2021 upon evaluation today patient actually appears to be doing excellent in regard to her wounds. She has been tolerating the dressing changes without complication. Fortunately there does not appear to be any signs of active infection  at this time which is great news. No fevers, chills, nausea, vomiting, or diarrhea. 01/23/2021 upon evaluation today patient appears to be doing worse in general in regard to her wounds. This is due to the fact that she is not been using her compression pumps which again she knows when she does not it always turns out poorly for her. Unfortunately the issue here today is that she is continuing to have troubles with her son she seems to be very depressed and I am truly sorry for her and everything she is going through. With that being said I do not know what to do to make things better if she want elevate her legs and use her lymphedema pumps there is really not have anything I can do to correct or fix the situation for her to be honest. I discussed all this with her today. 02/20/2021 upon evaluation today the patient unfortunately has been in the hospital due to cellulitis. Again this is something that is been an ongoing issue based on what I am seeing I think that she unfortunately still is having trouble complying with anything that we recommend. She has not been sleeping in her bed she tells me that she continues sleeping really in her motorized wheelchair that sweats been the case for the past several visits that have seen her despite me counseling to the opposite that she should be trying to sleep in her bed where she could get her legs off the ground and allow some of the swelling edema to come down. Subsequently also it appears that someone not sure his  primary care otherwise has given orders for her to not be wrapped and subsequently to be washing her legs daily which she is not doing. This was prior to her going into the hospital. She also tells me she is not been using her lymphedema pumps even before the infection setting and she went to the hospital. She also tells me that she does not always take her fluid pills and again the compression wraps have not been in place. Pretty much she has not done anything that I recommended up to this point. Again I had this conversation with her multiple times and unfortunately just does not seem to be sinking in that she really needs to be taking care of herself better. She is extremely noncompliant. 03/13/2021 upon evaluation today patient appears to be doing poorly still in regard to her legs. She is still not using any of the compression wrapping at this time on a regular basis and that she is taken it off. I discussed with her that she needs to not be doing this that that is definitely something that is going to cause her long-term trouble and definitely not help her to heal. She is also not use her lymphedema pumps but 1 time since last time I saw her 2 weeks ago based on what I am hearing. Nonetheless my issue here is simply that compliance is a big factor I think in her not getting better and this continues to be a problem. I think that I am very close to having a discussion with her that we may be discharging her due to noncompliance if she does not step things up. 03/20/2021 upon evaluation today patient appears to be doing poorly in regard to her lower extremities. She came in today which is the current layer on the wraps and no Coban. Apparently she had taken this off after we saw her last week on Wednesday.  This was after I had a long discussion with her about compliance and the fact that she needed to be compliant with what we were telling her. Subsequently she had her husband take this off apparently  within the next day following when she was here. Both on the home health nurse came in she did not have the Coban on as she told the home health nurse that we did not have her use the Coban any longer I am not really sure the reason she gave. We did contact Olivia Mackie who is the home health nurse in order to discuss this with her today. She states that that is exactly what the patient said that we were no longer using the [place]. She unfortunately had not received the directions from the office as far as orders were concerned and so did not have confirmation of this. We refax that but again apparently she never got those. Not sure what is going on in that regard but nonetheless in the interim the patient has not been wearing her compression wraps. She also has not been utilizing her lymphedema pumps she tells me "they are sitting on the table". She also has fluid pills but she tells me that she has not been using those because she does not even know where the bottle is. She tells me that she is not "in the frame of mind" to be able to comply with the treatment recommendations. Either way I discussed with her today that we are going to be initiating discharge from the wound care center due to noncompliance. Electronic Signature(s) Signed: 03/20/2021 4:07:30 PM By: [name] PA-C Entered By: [name] on 03/20/2021 16:07:30 -------------------------------------------------------------------------------- Physical Exam Details Patient Name: Date of Service: [name], [name] 03/20/2021 1:45 PM Medical Record Number: [id number] Patient Account Number: [id number] Date of Birth/Sex: Treating RN: 1950/07/11 (70 y.o. [name] Primary Care Provider: [name] Other Clinician: Referring Provider: Treating Provider/Extender: Sallye Ober, Whitney Weeks in Treatment: 86 Constitutional Well-nourished and well-hydrated in no acute distress. Respiratory normal breathing without  difficulty. Psychiatric this patient is able to make decisions and demonstrates good insight into disease process. Alert and Oriented x 3. pleasant and cooperative. Notes Upon inspection patient's wound bed actually showed signs of continued and ongoing significant drainage. Fortunately there does not appear to be signs of infection but unfortunately I think that she is just having a lot of significant amount of weeping secondary to the uncontrolled lymphedema due to multiple noncompliant reasons as dictated above. Electronic Signature(s) Signed: 03/20/2021 4:08:03 PM By: [name] PA-C Entered By: [name] on 03/20/2021 16:08:02 -------------------------------------------------------------------------------- Physician Orders Details Patient Name: Date of Service: [name]. 03/20/2021 1:45 PM Medical Record Number: [id number] Patient Account Number: [id number] Date of Birth/Sex: Treating RN: 03/02/1951 (70 y.o. [name] Primary Care Provider: [name] Other Clinician: Referring Provider: Treating Provider/Extender: Melchor Amour Weeks in Treatment: [phone] Verbal / Phone Orders: No Diagnosis Coding ICD-10 Coding Code Description E11.622 Type 2 diabetes mellitus with other skin ulcer I89.0 Lymphedema, not elsewhere classified I87.331 Chronic venous hypertension (idiopathic) with ulcer and inflammation of right lower extremity I87.332 Chronic venous hypertension (idiopathic) with ulcer and inflammation of left lower extremity L97.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 leg with fat layer exposed L97.822 Non-pressure chronic ulcer of other part of left lower leg with fat layer exposed L97.522 Non-pressure chronic ulcer of other part of left foot with fat layer  exposed [place] (primary) hypertension E66.01 Morbid (severe) obesity due to excess calories F41.8 Other specified anxiety disorders R53.1  Weakness Follow-up Appointments ppointment in 2 weeks. - with Margarita Grizzle Return A Bathing/ Shower/ Hygiene May shower and wash wound with soap and water. - with dressing changes, wash both legs with wash cloth and soap and water with dressing changes Other Bathing/Shower/Hygiene Orders/Instructions: - Home Health to wash legs with warm soapy water and a clean wash cloth with dressing changes Edema Control - Lymphedema / SCD / Other Bilateral Lower Extremities Lymphedema Pumps. Use Lymphedema pumps on leg(s) 2-3 times a day for 45-60 minutes. If wearing any wraps or hose, do not remove them. Continue exercising as instructed. Elevate legs to the level of the heart or above for 30 minutes daily and/or when sitting, a frequency of: - especially at night Avoid standing for long periods of time. Exercise regularly Home Health No change in wound care orders this week; continue Home Health for wound care. May utilize formulary equivalent dressing for wound treatment orders unless otherwise specified. Dressing changes to be completed by [facility] on Monday / Wednesday / Friday except when patient has scheduled visit at [facility]. Other Home Health Orders/Instructions: - [facility] Wound Treatment Wound #61 - Lower Leg Wound Laterality: Left, Circumferential Cleanser: Soap and Water 3 x Per Week/30 Days Discharge Instructions: May shower and wash wound with dial antibacterial soap and water prior to dressing change. Peri-Wound Care: Sween Lotion (Moisturizing lotion) (Home Health) 3 x Per Week/30 Days Discharge Instructions: Apply moisturizing lotion to dry skin on legs Prim Dressing: KerraCel Ag Gelling Fiber Dressing, 4x5 in (silver alginate) 3 x Per Week/30 Days ary Discharge Instructions: Apply silver alginate to wound bed as instructed Secondary Dressing: ABD Pad, 8x10 (Home Health) 3 x Per Week/30 Days Discharge Instructions: Apply over primary dressing as directed. Secondary  Dressing: Zetuvit Plus 4x8 in (Home Health) 3 x Per Week/30 Days Discharge Instructions: Apply over primary dressing as directed. Compression Wrap: ThreePress (3 layer compression wrap) (Home Health) 3 x Per Week/30 Days Discharge Instructions: Apply three layer compression as directed. Pad bend of ankle with foam or ABD pad. Wound #64 - Foot Wound Laterality: Dorsal, Left Cleanser: Soap and Water 3 x Per Week/30 Days Discharge Instructions: May shower and wash wound with dial antibacterial soap and water prior to dressing change. Peri-Wound Care: Sween Lotion (Moisturizing lotion) (Home Health) 3 x Per Week/30 Days Discharge Instructions: Apply moisturizing lotion to dry skin on legs Prim Dressing: KerraCel Ag Gelling Fiber Dressing, 4x5 in (silver alginate) 3 x Per Week/30 Days ary Discharge Instructions: Apply silver alginate to wound bed as instructed Secondary Dressing: ABD Pad, 8x10 (Home Health) 3 x Per Week/30 Days Discharge Instructions: Apply over primary dressing as directed. Secondary Dressing: Zetuvit Plus 4x4 in (Home Health) 3 x Per Week/30 Days Discharge Instructions: or equivalent extra absorbent pad.Apply over primary dressing as needed. Compression Wrap: ThreePress (3 layer compression wrap) (Home Health) 3 x Per Week/30 Days Discharge Instructions: Apply three layer compression as directed. Pad bend of ankle with foam or ABD pad. Wound #67 - Lower Leg Wound Laterality: Right, Medial Cleanser: Soap and Water 3 x Per Week/30 Days Discharge Instructions: May shower and wash wound with dial antibacterial soap and water prior to dressing change. Peri-Wound Care: Sween Lotion (Moisturizing lotion) (Home Health) 3 x Per Week/30 Days Discharge Instructions: Apply moisturizing lotion to dry skin on legs Prim Dressing: KerraCel Ag Gelling Fiber Dressing, 4x5 in (  silver alginate) 3 x Per Week/30 Days ary Discharge Instructions: Apply silver alginate to wound bed as  instructed Secondary Dressing: ABD Pad, 8x10 (Home Health) 3 x Per Week/30 Days Discharge Instructions: Apply over primary dressing as directed. Secondary Dressing: Zetuvit Plus 4x4 in (Home Health) 3 x Per Week/30 Days Discharge Instructions: or equivalent extra absorbent pad.Apply over primary dressing as needed. Compression Wrap: ThreePress (3 layer compression wrap) (Home Health) 3 x Per Week/30 Days Discharge Instructions: Apply three layer compression as directed. Pad bend of ankle with foam or ABD pad. Wound #72 - Foot Wound Laterality: Dorsal, Right Cleanser: Soap and Water 3 x Per Week/30 Days Discharge Instructions: May shower and wash wound with dial antibacterial soap and water prior to dressing change. Peri-Wound Care: Sween Lotion (Moisturizing lotion) (Home Health) 3 x Per Week/30 Days Discharge Instructions: Apply moisturizing lotion to dry skin on legs Prim Dressing: KerraCel Ag Gelling Fiber Dressing, 4x5 in (silver alginate) 3 x Per Week/30 Days ary Discharge Instructions: Apply silver alginate to wound bed as instructed Secondary Dressing: ABD Pad, 8x10 (Home Health) 3 x Per Week/30 Days Discharge Instructions: Apply over primary dressing as directed. Secondary Dressing: Zetuvit Plus 4x4 in (Home Health) 3 x Per Week/30 Days Discharge Instructions: or equivalent extra absorbent pad.Apply over primary dressing as needed. Compression Wrap: ThreePress (3 layer compression wrap) (Home Health) 3 x Per Week/30 Days Discharge Instructions: Apply three layer compression as directed. Pad bend of ankle with foam or ABD pad. [facility] wound clinic at [facility] - treatment of bilateral lower extremity lymphedema and weeping legs Electronic Signature(s) Signed: 03/21/2021 6:17:04 PM By: [name] RN, BSN Signed: 03/22/2021 9:30:20 AM By: [name] PA-C Entered By: [name] on 03/20/2021 15:46:33 Prescription  03/20/2021 -------------------------------------------------------------------------------- [name]. [name] [place] Patient Name: Provider: 1951-06-09 [id number] Date of Birth: NPI#: [id number] Sex: DEA #: [phone] Phone #: License #: [place] Patient Address: [address] [phone] [address] [place], [place] [address] [place], [place] [address] [phone] Allergies ACE Inhibitors; metformin; propofol Provider's Orders wound clinic at [facility] - treatment of bilateral lower extremity lymphedema and weeping legs Hand Signature: Date(s): Electronic Signature(s) Signed: 03/21/2021 6:17:04 PM By: [name] RN, BSN Signed: 03/22/2021 9:30:20 AM By: [name] PA-C Entered By: [name] on 03/20/2021 15:46:38 -------------------------------------------------------------------------------- Problem List Details Patient Name: Date of Service: [name]. 03/20/2021 1:45 PM Medical Record Number: [id number] Patient Account Number: [id number] Date of Birth/Sex: Treating RN: [birth year] (70 y.o. [name] Primary Care Provider: [name] Other Clinician: Referring Provider: Treating Provider/Extender: Melchor Amour Weeks in Treatment: [phone] Active Problems ICD-10 Encounter Code Description Active Date MDM Diagnosis E11.622 Type 2 diabetes mellitus with other skin ulcer 08/05/2017 No Yes I89.0 Lymphedema, not elsewhere classified 08/05/2017 No Yes I87.331 Chronic venous hypertension (idiopathic) with ulcer and inflammation of right 08/05/2017 No Yes lower extremity I87.332 Chronic venous hypertension (idiopathic) with ulcer and inflammation of left 08/05/2017 No Yes lower extremity L97.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 leg with fat layer 08/05/2017 No Yes exposed L97.822 Non-pressure chronic  ulcer of other part of left lower leg with fat layer exposed2/20/2019 No Yes L97.522 Non-pressure chronic ulcer of other part of left foot with fat layer exposed 06/01/2019 No Yes I10 Essential (primary) hypertension 08/05/2017 No Yes E66.01 Morbid (severe) obesity due  to excess calories 08/05/2017 No Yes F41.8 Other specified anxiety disorders 08/05/2017 No Yes R53.1 Weakness 08/05/2017 No Yes Inactive Problems Resolved Problems Electronic Signature(s) Signed: 03/20/2021 2:24:55 PM By: [name] PA-C Entered By: [name] on 03/20/2021 14:24:55 -------------------------------------------------------------------------------- Progress Note Details Patient Name: Date of Service: [name]. 03/20/2021 1:45 PM Medical Record Number: [id number] Patient Account Number: [id number] Date of Birth/Sex: Treating RN: 22-Mar-1951 (70 y.o. [name] Primary Care Provider: [name] Other Clinician: Referring Provider: Treating Provider/Extender: Melchor Amour Weeks in Treatment: [phone] Subjective Chief Complaint Information obtained from Patient Bilateral reoccurring LE ulcers History of Present Illness (HPI) this patient has been seen a couple of times before and returns with recurrent problems to her right and left lower extremity with swelling and weeping ulcerations due to not wearing her compression stockings which she had been advised to do during her last discharge, at the end of June 2018. During her last visit the patient had had normal arterial blood flow and her venous reflux study did not necessitate any surgical intervention. She was recommended compression and elevation and wound care. After prolonged treatment the patient was completely healed but she has been noncompliant with wearing or compressions.. She was here last week with an outpatient return visit planned but the patient came in a very poor general condition with altered mental  status and was rushed to the ER on my request. With a history of hypertension, diabetes, TIA and right-sided weakness she was set up for an MRI on her brain and cervical spine and was sent to [facility]. Getting an MRI done was very difficult but once the workup was done she was found not to have any spinal stenosis, epidural abscess or hematoma or discitis. This was radiculopathy to be treated as an outpatient and she was given a follow-up appointment. Today she is feeling much better alert and oriented and has come to reevaluate her bilateral lower extremity lymphedema and ulceration 03/25/2017 -- she was admitted to the hospital on 03/16/2017 and discharged on 03/18/2017 with left leg cellulitis and ulceration. She was started on vancomycin and Zosyn and x-ray showed no bony involvement. She was treated for a cellulitis with IV antibiotics changed to Rocephin and Flagyl and was discharged on oral Keflex and doxycycline to complete a 7 day course. Last hemoglobin A1c was 7.1 and her other ailments including hypertension got asthma were appropriately treated. 05/06/2017 -- she is awaiting the right size of compression stockings from [facility] but other than that has been doing well. ====== Old notes 70 year old patient was seen one time last October and was lost to follow-up. She has recurrent problems with weeping and ulceration of her left lower extremity and has swelling of this for several years. It has been worse for the last 2 months. Past medical history is significant for diabetes mellitus type 2, hypertension, gout, morbid obesity, depressive disorders, hiatal hernia, migraines, status post knee surgery, risk of a cholecystectomy, vaginal hysterectomy and breast biopsy. She is not a smoker. As noted before she has never had a venous duplex study and an arterial ABI study was attempted but the left lower extremity was noncompressible 10/01/2016 -- had a lower extremity venous duplex reflux  evaluation which showed no evidence of deep vein reflux in the right or left lower extremity, and no evidence of great saphenous vein reflux more than 500 ms in the right or left lower extremity, and the left small saphenous vein is incompetent  but no vascular consult was recommended. review of her electronic medical records noted that the ABI was checked in July 2017 where the right ABI was normal limits and the left ABI could not be ascertained due to pain with cuff pressure but the waveforms are within normal limits. her arterial duplex study scheduled for April 27. 10/08/2016 -- the patient has various reasons for not having a compression on and for the last 3 days she has had no compression on her left lower extremity either due to pain or the lack of nursing help. She does not use her juxta lites either. 10/15/2016 -- the patient did not keep her appointment for arterial duplex study on April 27 and I have asked her to reschedule this. Her pain is out of proportion with the physical findings and she continuously fails to wear a compression wraps and cuts them off because she says she cannot tolerate the pain. She does not use her juxta lites either. 10/22/2016 -- he has rescheduled her arterial duplex study to May 21 and her pain today is a bit better. She has not been wearing her juxta lites on her right lower extremity but now understands that she needs to do this. She did tolerate the to press compression wrap on her left lower extremity 10/29/2016 --arterial duplex study is scheduled for next week and overall she has been tolerating her compression wraps and also using her juxta lites on her right lower extremity 11/05/2016 -- the right ABI was 0.95 the left was 1.03. The digit TBI is on the right was 0.83 on the left was 0.92 and she had biphasic flow through these vessels. The impression was that of normal lower extremity arterial study. 11/12/2016 -- her pain is minimal and she is doing  very well overall. 11/26/2016 -- she has got juxta lites and her insurance will not pay for additional dual layer compression stockings. She is going to order some from [facility]. 05/12/2017 -- her juxta lites are very old and too big for her and these have not been helping with compression. She did get 20-30 mm compression stockings from [facility] but she and her husband are unable to put these on. I believe she will benefit from bilateral Extremit-ease, compression stockings and we will measure her for these today. 05/20/2017 -- lymphedema on the left lower extremity has increased a lot and she has a open ulceration as a result of this. The right lower extremity is looking pretty good. She has decided to by the compression stockings herself and will get reimbursed by the home health, at a later date. 05/27/2017 -- her sciatica is bothering her a lot and she thought her left leg pain was caused due to the compression wrap and hence removed it and has significant lymphedema. There is no inflammation on this left lower extremity. 06/17/17 on evaluation today patient appears to be doing very well and in fact is completely healed in regard to her ulcerations. Unfortunately however she does have continued issues with lymphedema nonetheless. We did order compression garments for her unfortunately she states that the size that she received were large although we ordered medium. Obviously this means she is not getting the optimal compression. She does not have those with her today and therefore we could not confirm and contact the company on her behalf. Nonetheless she does state that she is going to have her husband bring them by tomorrow so that we can verify and then get in touch with the company.  No fevers, chills, nausea, or vomiting noted at this time. Overall patient is doing better otherwise and I'm pleased with the progress she has made. 07/01/17 on evaluation today patient appears to be doing very well  in regard to her bilateral lower extremity she does not have any openings at this point which is excellent news. Overall I'm pleased with how things have progressed up to this time. Since she is doing so well we did order her compression which we are seeing her today to ensure that it fits her properly and everything is doing well in that regard and then subsequently she will be discharged. ============ Old Notes: 03/31/16 patient presents today for evaluation concerning open wounds that she has over the left medial ankle region as well as the left dorsal foot. She has previously had this occur although it has been healed for a number of months after having this for about a year prior until her hospitalization on 01/05/16. At that point in time it appears that she was admitted to the hospital for left lower extremity cellulitis and was placed on vancomycin and Zosyn at that point. Eventually upon discharge on January 15, 2016 she was placed on doxycycline at that point in time. Later on 03/27/16 positive wound culture growing Escherichia coli this was switched to amoxicillin. Currently she tells me that she is having pain radiated to be a 7 out of 10 which can be as high as 10 out of 10 with palpation and manipulation of the wound. This wound appears to be mainly venous in nature due to the bilateral lower extremity venous stasis/lymphedema. This is definitely much worse on her left than the right side. She does have type 1 diabetes mellitus, hypertension, morbid obesity, and is wheelchair dependent.during the course of the hospital stay a blood culture was also obtained and fortunately appeared negative. She also had an x-ray of the tibia/fibula on the left which showed no acute bone abnormality. Her white blood cell count which was performed last on 03/25/16 was 7.3, hemoglobin 12.8, protein 7.1, albumin 3.0. Her urine culture appeared to be negative for any specific organisms. Patient did have a left  lower extremity venous duplex evaluation for DVT . This did not include venous reflux studies but fortunately was negative for DVT Patient also had arterial studies performed which revealed that she had a . normal ABI on the right though this was unable to be performed on the left secondary to pain that she was having around the ankle region due to the wound. However it was stated on report that she had biphasic pulses and apparently good blood flow. ========== 06/03/17 she is here in follow-up evaluation for right lower extremity ulcer. The right lower sure he has healed but she has reopened to the left medial malleolus and dorsal foot with weeping. She is waiting for new compression garments to arrive from home health, the previous compression garments were ill fitting. We will continue with compression bilaterally and follow-up in 2 weeks Readmission: 08/05/17 on evaluation today patient appears to be doing somewhat poorly in regard to her left lower extremity especially although the right lower extremity has a small area which may no longer be open. She has been having a lot of drainage from the left lower extremity however he tells me that she has not been able to use the EXTREMIT-EASE Compression at this point. She states that she did better and was able to actually apply the Juxta-Lite compression although the wound that  she has is too large and therefore really does not compress which is why she cannot wear it at this point. She has no one who can help her put it on regular basis her son can sometimes but he's not able to do it most of the time. I do believe that's why she has begun to weave and have issues as she is currently yet again. No fevers, chills, nausea, or vomiting noted at this time. Patient is no evidence of dementia. 08/12/17 on evaluation today patient appears to still be doing fairly well in regard to the draining areas/weeping areas at this point. With that being said  she unfortunately did go to the ER yesterday due to what was felt to be possibly a cellulitis. They place her on doxycycline by mouth and discharge her home. She definitely was not admitted. With that being said she states she has had more discomfort which has been unusual for her even compared to prior times and she's had infections.08/12/17 on evaluation today patient appears to still be doing fairly well in regard to the draining areas/weeping areas at this point. With that being said she unfortunately did go to the ER yesterday due to what was felt to be possibly a cellulitis. They place her on doxycycline by mouth and discharge her home. She definitely was not admitted. With that being said she states she has had more discomfort which has been unusual for her even compared to prior times and she's had infections. 08/19/17 put evaluation today patient tells me that she's been having a lot of what sounds to be neuropathic type pain in regard to her left lower extremity. She has been using over-the-counter topical bins again which some believe. That in order to apply the she actually remove the wrap we put on her last Wednesday on Thursday. Subsequently she has not had anything on compression wise since that time. The good news is a lot of the weeping areas appear to have closed at this point again I believe she would do better with compression but we are struggling to get her to actually use what she needs to at this point. No fevers, chills, nausea, or vomiting noted at this time. 09/03/17 on evaluation today patient appears to be doing okay in regard to her lower extremities in regard to the lymphedema and weeping. Fortunately she does not seem to show any signs of infection at this point she does have a little bit of weeping occurring in the right medial malleolus area. With that being said this does not appear to be too significant which is good news. 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 She has severe skin damage secondary to both of these features involving the dorsal left foot and medial left ankle and lower leg. Still has open areas in the left anterior foot. The area on the right closed over. She uses her own juxta light stockings. She does not have an arterial issue 09/16/17 on evaluation today patient actually appears to be doing excellent in regard to her bilateral lower extremity swelling. The Juxta-Lite compression wrap seem to be doing very well for her. She has not however been using the portion that goes over her foot. Her left foot still is draining a little bit not nearly as significant as it has been in the past but still I do believe that she likely needs to utilize the full wrap including the foot portion of this  will improve as well. She also has been apparently putting on a significant amount of Vaseline which also think is not helpful for her. I recommended that if she feels she needs something for moisturizer Eucerin will probably be better. 09/30/17 on evaluation today patient presents with several new open areas in regard to her left lower extremity although these appear to be minimal and mainly seem to be more moisture breakdown than anything. Fortunately she does not seem to have any evidence of infection which is great news. She has been tolerating the dressing changes without complication we are using silver alginate on the foot she has been using AB pads to have the legs and using her Juxta- Lite compression which seems to be controlling her swelling very well. Overall I'm pleased with the poor way she has progressed. 10/14/17 on evaluation today patient appears to be doing better in regard to her left lower extremity areas of weeping. She does still have some discomfort although in general this does not appear to be as macerated and I think it is progressing nicely. I do think she still  needs to wear the foot portion of her Juxta- Lite in order to get the most benefit from the wrap obviously. She states she understands. Fortunately there does not appear to be evidence of infection at this time which is great news. 10/28/17 on evaluation today patient appears to be doing excellent in regard to her left lower extremity. She has just a couple areas that are still open and seem to be causing any trouble whatsoever. For that reason I think that she is definitely headed in the right direction the spots are very tiny compared to what we have been dealing with in the past. 11/11/17 on evaluation today patient appears to have a right lateral lower extremity ulcer that has opened since I last saw her. She states this is where the home health nurse that was coming out remove the dressing without wetting the alginate first. Nonetheless I do not know if this is indeed the case or not but more importantly we have not ordered home help to be coming out for her wounds at all. I'm unsure as to why they are coming out and we're gonna have to check on this and get things situated in that regard. With that being said we currently really do not need them to be coming out as the patient has been taking care of her leg herself without complication and no issues. In fact she was doing much better prior to nursing coming out. 11/25/17 on evaluation today patient actually appears to be doing fairly well in regard to her left lower extremity swelling. In fact she has very little area of weeping at this point there's just a small spot on the lateral portion of her right leg that still has me just a little bit more concerned as far as wanting to see this clear up before I discharge her to caring for this at home. Nonetheless overall she has made excellent progress. 12/09/17 on evaluation today patient appears to be doing rather well in regard to her lower extremity edema. She does have some weeping still in the left  lower extremity although the big area we were taking care of two weeks ago actually has closed and she has another area of weeping on the left lower extremity immediately as well is the top of her foot. She does not currently have lymphedema pumps she has been wearing her compression daily  on a regular basis as directed. With that being said I think she may benefit from lymphedema pumps. 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 We had a very difficult time getting and keeping this under control. 12/23/17 on evaluation today patient actually appears to be doing a little bit more poorly in regard to her bilateral lower extremities. She has been tolerating the Juxta-Lite compression wraps. Unfortunately she has two new ulcers on the right lower extremity and left lower Trinity ulceration seems to be larger. Obviously this is not good news. She has been tolerating the dressings without complication. 12/30/17 on evaluation today patient actually appears to be doing much better in regard to her bilateral lower extremity edema. She continues to have some issues with ulcerations and in fact there appears to be one spot on each leg where the wrap may have caused a little bit of a blister which is subsequently opened up at this point is given her pain. Fortunately it does not appear to be any evidence of infection which is good news. No fevers chills noted. 01/13/18 on evaluation today patient appears to be doing rather well in regard to her bilateral lower extremities. The dressings did get kind of stuck as far as the wound beds are concerned but again I think this is mainly due to the fact that she actually seems to be showing signs of healing which is good news. She's not having as much drainage therefore she was having more of the dressing sticking.  Nonetheless overall I feel like her swelling is dramatically down compared to previous. 01/20/18 on evaluation today patient unfortunately though she's doing better in most regards has a large blister on the left anterior lower extremity where she is draining quite significantly. Subsequently this is going to need debridement today in order to see what's underneath and ensure she does not continue to trapping fluid at this location. Nonetheless No fevers, chills, nausea, or vomiting noted at this time. 01/27/18 on evaluation today patient appears to be doing rather well at this point in regard to her right lower extremity there's just a very small area that she still has open at this point. With that being said I do believe that she is tolerating the compression wraps very well in making good progress. Home health is coming out at this point to see her. Her left lower extremity on the lateral portion is actually what still mainly open and causing her some discomfort for the most part 02/10/18 on evaluation today patient actually appears to be doing very well in regard to her right lower extremity were all the ulcers appear to be completely close. In regard to the left lower extremity she does have two areas still open and some leaking from the dorsal surface of her foot but this still seems to be doing much better to me in general. 02/24/18 on evaluation today patient actually appears to be doing much better in regard to her right lower extremity this is still completely healed. Her left lower extremity is also doing much better fortunately she has no evidence of infection. The one area that is gonna require some debridement is still on the left anterior shin. Fortunately this is not hurting her as badly today. 03/10/18 on evaluation today patient appears to be doing better in some regards although she has a  little bit more open area on the dorsal foot and she also has some issues on the medial portion of  the left lower extremity which is actually new and somewhat deep. With that being said there fortunately does not appear to be any significant signs of infection which is good news. No fevers, chills, nausea, or vomiting noted at this time. In general her swelling seems to be doing fairly well which is good news. 03/31/18 on evaluation today patient presents for follow-up concerning her left lower extremity lymphedema. Unfortunately she has been doing a little bit more poorly since I last saw her in regard to the amount of weeping that she is experiencing. She's also having some increased pain in the anterior shin location. Unfortunately I do not feel like the patient is making such good progress at this point a few weeks back she was definitely doing much better. 04/07/18 on evaluation today patient actually appears to be showing some signs of improvement as far as the left lower extremity is concerned. She has been tolerating the dressing changes and it does appear that the Drawtex did better for her. With that being said unfortunately home health is stating that they cannot obtain the Drawtex going forward. Nonetheless we're gonna have to check and see what they may be able to get the alginate they were using was getting stuck in causing new areas of skin being pulled all that with and subsequently weep and calls her to worsen overall this is the first time we've seen improvement at this time. 04/14/18 on evaluation today patient actually appears to be doing rather well at this point there does not appear to be any evidence of infection at this time and she is actually doing excellent in regard to the weeping in fact she almost has no openings remaining even compared to just last week this is a dramatic improvement. No fevers chills noted 04/21/18 evaluation today patient actually appears to be doing very well. She in fact is has a small area on the posterior lower extremity location and she has  a small area on the dorsal surface of her foot that are still open both of which are very close to closing. We're hoping this will be close shortly. She brought her Juxta-Lite wrap with her today hoping that would be able to put her in it unfortunately I don't think were quite at that point yet but we're getting closer. 04/28/18 upon evaluation today patient actually appears to be doing excellent in regard to her left lower extremity ulcer. In fact the region on the posterior lower extremity actually is much smaller than previously noted. Overall I'm very happy with the progress she has made. She again did bring her Juxta-Lite although we're not quite ready for that yet. 05/11/18 upon evaluation today patient actually appears to be doing in general fairly well in regard to her left lower Trinity. The swelling is very well controlled. With that being said she has a new area on the left anterior lower extremity as well as between the first and second toes of her left foot that was not present during the last evaluation. The region of her posterior left lower extremity actually appears to be almost completely healed. T be honest I'm very pleased with o the way that stands. Nonetheless I do believe that the lotion may be keeping the area to moist as far as her legs are concerned subsequently I'm gonna consider discontinuing that today. 05/26/18 on evaluation today patient appears  to be doing rather well in regard to her left lower should be ulcers. In fact everything appears to be close except for a very small area on the left posterior lower extremity. Fortunately there does not appear to be any evidence of infection at this time. Overall very pleased with her progress. 06/02/18 and evaluation today patient actually appears to be doing very well in regard to her lower extremity ulcers. She has one small area that still continues to weep that I think may benefit her being able to justify lotion and user  Juxta-Lite wraps versus continued to wrap her. Nonetheless I think this is something we can definitely look into at this point. 06/23/18 on evaluation today patient unfortunately has openings of her bilateral lower extremities. In general she seems to be doing much worse than when I last saw her just as far as her overall health standpoint is concerned. She states that her discomfort is mainly due to neuropathy she's not having any other issues otherwise. No fevers, chills, nausea, or vomiting noted at this time. 06/30/18 on evaluation today patient actually appears to be doing a little worse in regard to her right lower extremity her left lower extremity of doing fairly well. Fortunately there is no sign of infection at this time. She has been tolerating the dressing changes without complication. Home health did not come out like they were supposed to for the appropriate wrap changes. They stated that they never received the orders from [place] which were fax. Nonetheless we will send a copy of the orders with the patient today as well. 07/07/18 on evaluation today patient appears to be doing much better in regard to lower extremities. She still has several openings bilaterally although since I last saw her her legs did show obvious signs of infection when she later saw her nurse. Subsequently a culture was obtained and she is been placed on Bactrim and Keflex. Fortunately things seem to be looking much better it does appear she likely had an infection. Again last week we'd even discussed it but again there really was not any obvious sign that she had infection therefore we held off on the antibiotics. Nonetheless I'm glad she's doing better today. 07/14/18 on evaluation today patient appears to be doing much better regarding her bilateral lower Trinity's. In fact on the right lower for me there's nothing open at this point there are some dry skin areas at the sites where she had infection. Fortunately there is  no evidence of systemic infection which is excellent news. No fevers chills noted 07/21/18 on evaluation today patient actually appears to be doing much better in regard to her left lower extremity ulcers. She is making good progress and overall I feel like she's improving each time I see her. She's having no pain I do feel like the infection is completely resolved which is excellent news. No fevers, chills, nausea, or vomiting noted at this time. 07/28/18 on evaluation today patient appears to be doing very well in regard to her left lower [employer]. Everything seems to be showing signs of improvement which is excellent news. Overall very pleased with the progress that has been made. Fortunately there's no evidence of active infection at this time also excellent news. 08/04/18 on evaluation today patient appears to be doing more poorly in regard to her bilateral lower extremities. She has two new areas open up on the right and these were completely closed as of last week. She still has the two spots  on the left which in my pinion seem to be doing better. Fortunately there's no evidence of infection again at this point. 08/11/18 on evaluation today patient actually appears to be doing very well in regard to her bilateral lower Trinity wounds that all seem to be doing better and are measures smaller today. Fortunately there's no signs of infection. No fevers, chills, nausea, or vomiting noted at this time. 08/18/18 on evaluation today patient actually appears to be doing about the same inverter bilateral lower extremities. She continues to have areas that blistering open as was drain that fortunately nothing too significant. Overall I feel like Drawtex may have done better for her however compared to the collagen. 08/25/18 on evaluation today patient appears to be doing a little bit more poorly today even compared to last time I saw her. Again I'm not exactly sure why she's making worse progress over  the past several weeks. I'm beginning to wonder if there is some kind of underlying low level infection causing this issue. I did actually take a culture from the left anterior lower extremity but it was a new wound draining quite a bit at this point. Unfortunately she also seems to be having more pain which is what also makes me worried about the possibility of infection. This is despite never erythema noted at this point. 09/01/18 on evaluation today patient actually appears to be doing a little worse even compared to last week in regard to bilateral lower extremities. She did go to the hospital on the 16th was given a dose of IV Zosyn and then discharged with a recommendation to continue with the Bactrim that I previously prescribed for her. Nonetheless she is still having a lot of discomfort she tells me as well at this time. This is definitely unfortunate. No fevers, chills, nausea, or vomiting noted at this time. 09/08/18 on evaluation today patient's bilateral lower extremities actually appear to be shown signs of improvement which is good news. Fortunately there does not appear to be any signs of active infection I think the anabiotic is helping in this regard. Overall I'm very pleased with how she is progressing. 09/15/18 patient was actually seen in ER yesterday due to her legs as well unfortunately. She states she's been having a lot of pain and discomfort as well as a lot of drainage. Upon inspection today the patient does have a lot of swelling and drainage I feel like this is more related to lymphedema and poor fluid control than it is to infection based on what I'm seeing. The physician in the emergency department also doubted that the patient was having a significant infection nonetheless I see no evidence of infection obvious at this point although I do see evidence of poor fluid control. She still not using a compression pumps, she is not elevating due to her lift chair as well as her  hospital bed being broken, and she really is not keeping her legs up as much as they should be and also has been taking off her wraps. All this combined I think has led to poor fluid control and to be honest she may be somewhat volume overloaded in general as well. I recommend that she may need to contact your physician to see if a prescription for a diuretic would be beneficial in their opinion. As long as this is safe I think it would likely help her. 09/29/18 on evaluation today patient's left lower extremity actually appears to be doing quite a bit better.  At least compared to last time that I saw her. She still has a large area where she is draining from but there's a lot of new skin speckled trout and in fact there's more new skin that there are open areas of weeping and drainage at this point. This is good news. With regard to the right lower extremity this is doing much better with the only open area that I really see being a dry spot on the right lateral ankle currently. Fortunately there's no signs of active infection at this time which is good news. No fevers, chills, nausea, or vomiting noted at this time. The patient seems somewhat stressed and overwhelmed during the visit today she was very lethargic as such. She does and she is not taking any pain medications at this point. Apparently according to her husband are also in the process of moving which is probably taking its toll on her as well. 10/06/18 on evaluation today patient appears to be doing rather well in regard to her lower extremities compared to last evaluation. Fortunately there's no signs of active infection. She tells me she did have an appointment with her primary. Nonetheless he was concerned that the wounds were somewhat deep based on pictures but we never actually saw her legs. She states that he had her somewhat worried due to the fact that she was fearing now that she was [place] have to have an amputation. With that being  said based on what I'm seeing check she looks better this week that she has the last two times I've seen her with much less drainage I'm actually pleased in this regard. That doesn't mean that she's out of the water but again I do not think what the point of talking about education at all in regard to her leg. She is very happy to hear this. She is also not having as much pain as she was having last week. 10/13/18 unfortunately on evaluation today patient still continues to have a significant amount of drainage she's not letting home health actually apply the compression dressings at this point. She's trying to use of Juxta-Lite of the top of Kerlex and the second layer of the three layer compression wrap. With that being said she just does not seem to be making as good a progress as I would expect if she was having the compression applied and in place on a regular basis. No fevers, chills, nausea, or vomiting noted at this time. 10/20/18 on evaluation today patient appears to be doing a little better in regard to her bilateral lower extremity ulcers. In fact the right lower extremity seems to be healed she doesn't even have any openings at this point left lower extremity though still somewhat macerated seems to be showing signs of new skin growth at multiple locations throughout. Fortunately there's no evidence of active infection at this time. No fevers, chills, nausea, or vomiting noted at this time. 10/27/18 on evaluation today patient appears to be doing much better in regard to her left lower Trinity ulcer. She's been tolerating the laptop complication and has minimal drainage noted at this point. Fortunately there's no signs of active infection at this time. No fevers, chills, nausea, or vomiting noted at this time. 11/03/18 on evaluation today patient actually appears to be doing excellent in regard to her left lower extremity. She is having very little drainage at this point there does not appear  to be any significant signs of infection overall very pleased with how  things have gone. She is likewise extremely pleased still and seems to be making wonderful progress week to week. I do believe antibiotics were helpful for her. Her primary care provider did place on amateur clean since I last saw her. 11/17/18 on evaluation today patient appears to be doing worse in regard to her bilateral lower extremities at this point. She is been tolerating the dressing changes without complication. With that being said she typically takes the [place] off fairly quickly upon arriving home even after being seen here in the clinic and does not allow home health reapply command as part of the dressing at home. Therefore she said no compression essentially since I last saw her as best I can tell. With that being said I think it shows and how much swelling she has in the open wounds that are noted at this point. Fortunately there's no signs of infection but unfortunately if she doesn't get this under control I think she will end up with infection and more significant issues. 11/24/18 on evaluation today patient actually appears to be doing somewhat better in regard to her bilateral lower extremities. 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 Nonetheless that is doing better currently so she should be able to attempt reinitiating the lymphedema pumps at this time. No fevers, chills, nausea, or vomiting noted at this time. 12/01/18 upon evaluation today patient's left lower extremity appears to be doing quite well unfortunately her right lower extremity is not doing nearly as well. She has been tolerating the dressing changes without complication unfortunately she did not keep a wrap on the right at this time. Nonetheless I believe this has led to increased swelling and weeping in  the world is actually much larger than during the last evaluation with her. 12/08/18 on evaluation today patient appears to be doing about the same at this point in regard to her right lower extremity. There is some more palatable to touch I'm concerned about the possibility of there being some infection although I think the main issue is she's not keeping her compression wrap on which in turn is not allowing this area to heal appropriately. 12/22/18 on evaluation today patient appears to be doing better in regard to left lower extremity unfortunately significantly worse in regard to the right lower extremity. The areas of blistering and necrotic superficial tissue have spread and again this does not really appear to be signs of infection and all she just doesn't seem to be doing nearly as well is what she has been in the past. Overall I feel like the Augmentin did absolutely nothing for her she doesn't seem to have any infection again I really didn't think so last time either is more of a potential preventative measure and hoping that this would make some difference but I think the main issue is she's not wearing her compression. She tells me she cannot wear the [place] been we put on she takes it off pretty much upon getting home. Subsequently she worshiped Juxta-Lite when I questioned her about how often she wears it this is no more than three hours a day obviously that leaves 21 hours that she has no compression and this is obviously not doing well for her. Overall I'm concerned that if things continue to worsen she is at great risk of  both infection as well as losing her leg. 01/05/19 on evaluation today patient appears to be doing well in regard to her left lower extremity which he is allowing [place] to wrap and not so well with regard to her right lower extremity which she is not allowing [place] to really wrap and keep the wrap on. She states that it hurts too badly whenever it's wrapped and she ends up  having to take it off. She's been using the Juxta-Lite she tells me up to six hours a day although I question whether or not that's really been the case to be honest. Previously she told me three hours today nonetheless obviously the legs as long as the wrap is doing great when she is not is doing much more poorly. 01/12/2019 on evaluation today patient actually appears to be doing a little better in my opinion with regard to her right lower extremity ulcer. She has a small open area on the left lower extremity unfortunately but again this I think is part of the normal fluctuation of what she is going to have to expect with regard to her legs especially when she is not using her lymphedema pumps on a regular basis. Subsequently based on what I am seeing today I think that she does seem to be doing slightly better with regard to her right lower extremity she did see her primary care provider on Monday they felt she had an infection and placed her on 2 antibiotics. Both Cipro and clindamycin. Subsequently again she seems possibly to be doing a little bit better in regards to the right lower extremity she also tells me however she has been wearing the compression wrap over the past week since I spoke with her as well that is a Kerlix and Coban wrap on the right. No fevers, chills, nausea, vomiting, or diarrhea. 01/19/2019 on evaluation today patient appears to be doing better with regard to her bilateral lower extremities especially the right. I feel like the compression has been beneficial for her which is great news. She did get a call from her primary care provider on her way here today telling her that she did have methicillin- resistant Staphylococcus aureus and he was calling in a couple new antibiotics for her including a ointment to be applied she tells me 3 times a day. With that being said this sounds like likely to be Bactroban which I think could be applied with each dressing/wrap change but I  would not be able to accommodate her applying this 3 times a day. She is in agreement with the least doing this we will add that to her orders today. 01/26/2019 on evaluation today patient actually appears to be doing much better with regard to her right lower extremity. Her left lower extremity is also doing quite well all things considering. Fortunately there is no evidence of active infection at this time. No fevers, chills, nausea, vomiting, or diarrhea. 02/02/2019 on evaluation today patient appears to be doing much better compared to her last evaluation. Little by little off like her right leg is returning more towards normal. There does not appear to be any signs of active infection and overall she seems to be doing quite well which is great news. I am very pleased in this regard. No fevers, chills, nausea, vomiting, or diarrhea. 02/09/2019 upon evaluation today patient appears to be doing better with regard to her bilateral lower extremities. She has been tolerating the dressing changes without complication. Fortunately there is no  signs of active infection at this time. No fevers, chills, nausea, vomiting, or diarrhea. 02/23/2019 on evaluation today patient actually appears to be doing quite well with regard to her bilateral lower extremities. She has been tolerating the dressing changes without complication. She is even used her pumps one time and states that she really felt like it felt good. With that being said she seems to be in good spirits and her legs appear to be doing excellent. 03/09/2019 on evaluation today patient appears to be doing well with regard to her right lower extremity there are no open wounds at this time she is having some discomfort but I feel like this is more neuropathy than anything. With regard to her left lower extremity she had several areas scattered around that she does have some weeping and drainage from but again overall she does not appear to be having any  significant issues and no evidence of infection at this time which is good news. 03/23/2019 on evaluation today patient appears to be doing well with regard to her right lower extremity which she tells me is still close she is using her juxta light here. Her left lower extremity she mainly just has an area on the foot which is still slightly draining although this also is doing great. Overall very pleased at this time. 04/06/2019 patient appears to be doing a little bit worse in regard to her left lower extremity upon evaluation today. She feels like this could be becoming infected again which she had issues with previous. Fortunately there is no signs of systemic infection but again this is always a struggle with her with her legs she will go from doing well to not so well in a very short amount of time. 04/20/2019 on evaluation today patient actually appears to be doing quite well with regard to her right lower extremity I do not see any signs of active infection at this time. Fortunately there is no fever chills noted. She is still taking the antibiotics which I prescribed for her at this point. In regard to the left lower extremity I do feel like some of these areas are better although again she still is having weeping from several locations at this time. 04/27/2019 on evaluation today patient appears to be doing about the same if not slightly worse in regard to her left lower extremity ulcers. She tells me when questioned that she has been sleeping in her Hoveround chair in fact she tells me she falls asleep without even knowing it. I think she is spending a whole lot of time in the chair and less time walking and moving around which is not good for her legs either. On top of that she is in a seated position which is also the worst position she is not really elevating her legs and she is also not using her lymphedema pumps. All this is good to contribute to worsening of her condition in  general. 05/18/2019 on evaluation today patient appears to be doing well with regard to her lower extremity on the right in fact this is showing no signs of any open wounds at this time. On the left she is continuing to have issues with areas that do drain. Some of the regions have healed and there are couple areas that have reopened. She did go to the ER per the patient according to recommendations from the home health nurse due to what she was seen when she came out on 05/13/2019. Subsequently she felt like  the patient needed to go to the hospital due to the fact that again she was having "milky white discharge" from her leg. Nonetheless she had and then was placed on doxycycline and subsequently seems to be doing better. 06/01/2019 upon evaluation today patient appears to be doing really in my opinion about the same. I do not see any signs of active infection which is good news. Overall she still has wounds over the bilateral lower extremities she has reopened on the right but this appears to be more of a crack where there is weeping/edema coming from the region. I do not see any evidence of infection at either site based on what I visualized today. 07/13/2019 upon evaluation today patient appears to be doing a little worse compared to last time I saw her. She since has been in the hospital from 06/21/2019 through 06/29/2019. This was secondary to having Covid. During that time they did apply lotion to her legs which unfortunately has caused her to develop a myriad of open wounds on her lower extremities. Her legs do appear to be doing better as far as the overall appearance is concerned but nonetheless she does have more open and weeping areas. 07/27/2019 upon evaluation today patient appears to be doing more poorly to be honest in regard to her left lower extremity in particular. There is no signs of systemic infection although I do believe she may have local infection. She notes she has been having  a lot of blue/green drainage which is consistent potentially with Pseudomonas. That may be something that we need to consider here as well. The doxycycline does not seem to have been helping. 08/03/2019 upon evaluation today patient appears to be doing a little better in my opinion compared to last week's evaluation. Her culture I did review today and she is on appropriate medications to help treat the Enterobacter that was noted. Overall I feel like that is good news. With that being said she is unfortunately continuing to have a lot of drainage and though it is doing better I still think she has a long ways to go to get things dried up in general. Fortunately there is no signs of systemic infection. 08/10/2019 upon evaluation today patient appears to be doing may be slightly better in regard to her left lower extremity the right lower extremity is doing much better. Fortunately there is no signs of infection right now which is good news. No fevers, chills, nausea, vomiting, or diarrhea. 08/24/2019 on evaluation today patient appears to be doing slightly better in regard to her lower extremities. The left lower extremity seems to be healed the right lower extremity is doing better though not completely healed as far as the openings are concerned. She has some generalized issues here with edema and weeping secondary to her lymphedema though again I do believe this is little bit drier compared to prior weeks evaluations. In general I am very pleased with how things seem to be progressing. No fevers, chills, nausea, vomiting, or diarrhea. 08/31/2019 upon evaluation today patient actually seems to making some progress here with regard to the left lower extremity in particular. She has been tolerating the dressing changes without complication. Fortunately there is no signs of active infection at this time. No fevers, chills, nausea, vomiting, or diarrhea. She did see Dr. Doren Custard and he did note that she did have  a issue with the left great saphenous vein and the small saphenous vein in the leg. With that being said he  was concerned about the possibility of laser ablation not being extremely successful. He also mentioned a small risk of DVT associated with the procedure. However if the wounds do not continue to improve he stated that that would probably be the way to go. Fortunately the patient's legs do seem to be doing much better. 09/07/2019 upon evaluation today patient appears to be doing better with regard to her lower extremities. She has been tolerating the dressing changes without complication. With that being said she is showing signs of improvement and overall very pleased. There are some areas on her leg that I think we do need to debride we discussed this last week the patient is in agreement with doing that as long as it does not hurt too badly. 09/14/2019 upon evaluation today patient appears to be doing decently well with regard to her left lower extremity. She is not having near as much weeping as she has had in the past things seem to be drying up which is good news. There is no signs of active infection at this time. 09/21/19 upon evaluation today patient appears to be doing better in regard overall to her bilateral lower extremities. She again has less open than she did previous and each week I feel like this is getting better. Fortunately there is no signs of active infection at this time. No fevers, chills, nausea, vomiting, or diarrhea. 09/28/2019 upon evaluation today patient actually appears to be showing signs of improvement with regard to her left lower extremity. Unfortunately the right medial lower extremity around the ankle region has reopened to some degree but this appears to be minimal still which is good news. There is no signs of active infection at this time which is also good news. 10/12/2019 upon evaluation today patient appears to be doing okay with regard to her bilateral  lower extremities today. The right is a little bit worse then last evaluation 2 weeks ago. The left is actually doing a little better in my opinion. Overall there is no signs of active infection at this time that I see. Obviously that something we have to keep a close eye on she is very prone to this with the significant and multiple openings that she has over the bilateral lower extremities. 10/19/2019 upon evaluation today patient appears to be doing about the best that I have seen her in quite some time. She has been tolerating the dressing changes without complication. There does not appear to be any signs of active infection and overall I am extremely happy with the way her legs appeared. She is drying up quite nicely and overall is having less pain. 11/09/2019 upon evaluation today patient appears to be doing better in regard to her wounds. She seems to be drying up more and more each time I see her this is just taking a very long time. Fortunately there is no signs of active infection at this time. 11/23/2019 upon evaluation today patient actually appears to be doing excellent in regard to her lower extremities at this point compared to where she has been. Fortunately there is no signs of active infection at this time. She did go to the hospital last week for nausea and vomiting completely unrelated to her wounds. Fortunately she is doing better she was given some Reglan and got better. She had associated abdominal pain but they never found out what was going on. 12/07/2019 upon evaluation today patient appears to be doing well for the most part in regard to her  legs. She unfortunately has not been keeping the Coban portion of her wraps on therefore the compression has not really been sufficient for what it is supposed to be. Nonetheless she tells me that it just hurt too bad therefore she removed it. 12/21/2019 upon evaluation today patient actually appears to be doing quite well with regard to her  legs. I do feel like she has been making progress which is great news and overall there is no signs of active infection at this time. No fevers, chills, nausea, vomiting, or diarrhea. 01/04/2020 upon evaluation today patient presents for follow-up concerning her lower extremity edema bilaterally. She still has open wounds she has not been using her lymphedema pumps. She is also not been utilizing her compression wraps appropriately she tends to unwrap them, take them off, or states that they hurt. Obviously the reason they hurt is because her legs start to swell but the issue is if she would use her compression/lymphedema pumps regularly she would not swell and she would have the pain. Nonetheless she has not even picked them up once honestly over the past several months and may be even as much as in the past year based on my opinion and what have seen. She tells me today that after last week when I talked about this with her specifically actually that was 2 weeks ago that she "forgot". 8//21 on evaluation today patient appears to be doing a little better in regard to her legs bilaterally. Fortunately there is no signs of active infection at this time. She tells me that she used her lymphedema pumps all of one time over the past 2 weeks since I last saw her. She tells me that she has been too busy in order to continue to use these. 02/01/2020 on evaluation today patient appears to be doing some better in regard to her wounds in general in her legs. We felt the right was healed although is not completely it does appear to be doing better she tells me she has been using her lymphedema pumps that she has had this six times since I last saw her. Obviously the more she does that the better she would do my opinion 02/15/2020 upon evaluation today patient appears to be doing about the same in regard to her legs. She tells me that she is pumping I'm still not sure how much she does to be perfectly honest. However  even if she does a little bit here and there I guess that is better than nothing. Fortunately there is no sign of active infection at this time which is great news. No fevers, chills, nausea, vomiting, or diarrhea. 02/29/2020 on evaluation today patient actually appears to be doing quite well all things considered this week. She has been tolerating the dressing changes without complication. Fortunately there is no signs of active infection at this time. No fevers, chills, nausea, vomiting, or diarrhea. 03/14/2020 upon evaluation today patient appears to be doing really about the same in regard to her legs. There is no signs of improvement overall and she as noted from home health does not appear to be elevating her legs he can get into her lift chair. There is too much stuff piled up on it the patient tells me. She also tells me she cannot really use her pumps effectively due to the fact that she cannot have any space to get them on. Finally she is also not really elevating her legs because she is not sleeping in her bed  she is sleeping in her chair currently and again overall I think everything that she is done in combination has been exactly the wrong thing for what she needs for her legs. 04/04/2020 upon evaluation today patient appears to be doing well at this time with regard to her legs. She is actually been pumping, keeping her wraps on, and to be honest she seems to be doing dramatically better the right leg is excellent the left leg is also excellent and measuring much smaller than previous. 04/18/2020 upon evaluation today patient actually is continue to make good progress in regard to her lower extremities bilaterally. Everything is improving and less wet that has been in the past overall I am extremely pleased with where things stand and I think that she is making great progress. The patient tells me she still continue to use her compression pumps 05/02/2020 on evaluation today patient appears  to be doing well at this time in regard to her left leg which is showing signs of drying up. With that being said she does have a lot of lymphedema type crusty skin around the toes of her left foot and the right medial ankle which has opened at this point. Fortunately there is no signs of active infection systemically at this point or even locally for that matter. 05/23/2020 on evaluation today patient appears to be doing well with regard to her lower extremities. Fortunately there is no signs of active infection at this time. No fever chills noted. She has been very depressed however she tells me. 06/06/2020 patient came in today for evaluation in regard to her bilateral lower extremity ulcerations. With that being said she came in feeling okay and actually laughing and joking around with the staff checking her in. Subsequently however she had a coughing spell and following the coughing spell it was a dramatic conversion from being jovial and joking around to being extremely short of breath her vital signs actually dropped in regard to her blood pressure from around 175 to down around 852 for systolic and from around 96 diastolic down to around 70. With that being said she also accompanied this with an increase in her respiratory rate which was also quite significant. Nonetheless I actually upon going into see her was extremely worried we called EMS to have her transported to the ER for further evaluation and treatment. She continued until EMS got here to be extremely short of breath even on 6 L of oxygen. Her oxygen saturation did come up to 99% but overall it was only 95 before which was not terrible she did feel like the oxygen helped her feel somewhat better however. She has a history of asthma but again even the coughing spell really should not have done this degree of alteration in her demeanor from where she was just before to after the coughing spell. She tells [place] however she has been feeling  somewhat abnormal since Friday although she could not really pinpoint exactly what was going on. 06/27/2020 plan evaluation today patient appears to be doing okay in regard to her leg ulcers. She is really not showing a lot of improvement to be honest and she still has a lot of weeping. With that being said after I last saw her we called EMS to take her to the hospital she actually did not go she went to see her primary care provider who according to the patient have not identified and read through the note states that everything checked out okay. Nonetheless  the patient has continued to have bouts where she gets very short of breath for seemingly no reason whatsoever. That happened even the same night that I saw her last time. This was after she refused transport to the hospital. With that being said she also tells me that just getting dressed to come here is quite a chore which is putting on her shirt causing her to have to stop to catch her breath. Obviously this is not normal and I feel like there is something going on. She may need a referral to be seen by cardiology ASAP. She does see her primary care provider early next week she tells me 07/11/2020 upon evaluation today patient's wounds again appear to be doing about the same she mainly has weeping of the bilateral lower extremities both are very swollen today she tells me she has been using her pumps but last time I saw her she told me she did not even have her pumps out that she had "packed them away because they had too much stuff in their house. Apparently her husband has been bringing stuff in from storage units. She then subsequently told me that she had so much stuff in her house that she has been staying up all night and did not go to bed till 7:00 this morning because she was cleaning up stuff. Nonetheless I am still unsure as to whether or not she is using the pumps based on what I am seeing I would think probably not. Nonetheless she  has been keeping the compression wraps in place that is at least something 07/25/2020 upon evaluation today patient appears to be doing well currently in regard to her legs all things considered. I do not think she is doing any worse significantly although honestly I do not think she is doing a lot better either. There does not appear to be any signs of infection which is good news although she does have a lot of weeping. She is using her pumps she tells me sporadically though she says that her husband is not a big fan of helping her to get the [place]. She also tells me is difficult because in their house. It would be easiest for her to use these as where her son is actually staying with her and living out at this point. Obviously I understand that she is having a lot of issues here and to be honest I do not really know what to say in that regard and the fact that I really cannot change the situations but I do believe she really needs to be using the lymphedema pumps to try to keep things under control here. Outside of that I think she is going to continue to have significant issues. I think the pumps coupled with good compression and elevation are the only things that are to help her at this point. 08/08/2020 upon evaluation today patient appears to be doing a little worse in regard to her legs in general. I think this may be due to some infection currently. With that being said I am going to go ahead and likely see about putting her on an antibiotic. Were also can obtain a culture today where she had some purulent drainage. 08/29/2020 upon evaluation today patient appears to be doing about the same in regard to her bilateral lower extremities. Unfortunately she is continuing to have significant issues here with edema and she has significant lymphedema. With that being said she is really not doing anything that she  is supposed to be doing as far as elevation, compression, using lymphedema pumps or  anything really for that matter. She does not use her pumps, is taken the wraps off shortly after having them put on as far as home health is concerned at least by the next day she tells me, and overall is not really elevating her legs she is telling me she has a lot to do as far as cleaning up in her house. Nonetheless this triad of noncompliance is leading to worsening not improving in regard to her legs. I had a very strong conversation with her about this today again. 09/12/2020 on evaluation today patient appears to be doing poorly with regard to her lower extremities bilaterally. Fortunately there does not appear to be any signs of active infection at this point. That is systemically. Locally there is some signs of pus coming from an area on her left leg. She has been on antibiotics recently though she is done with those currently. Nonetheless she tells me she has been having increased pain with the right leg this is extremely swollen as well. Fortunately there does not appear to be significant infection of the right leg though I think her pain is really as result of the increased swelling she has been declining the wrap on the right leg and the wounds here are getting significantly worse week by week. This obviously is not will be want to see. I discussed with the patient however if she does not use the wraps, use her pumps, and elevate her legs that she is not to get better. She has been sleeping in her motorized Hoveround which does not even allow her to elevate her legs at all. She tells me that she is also constantly "cleaning up as her husband told her that he wants to move back to [place]." Unfortunately this has been the narrative for the past several months in fact that she tells me that she has been having to clean up mass that was moved in from storage units into their home and that she does not really sleep and she never really elevates her legs. I explained to the patient today  that if she is not can I do any of this that there is really nothing that I can do to help her. 09/26/20 upon evaluation today patient appears to be doing about the same in regard to her wounds. Fortunately there is no signs of active infection at this time. No fever chills noted. She has been tolerating the dressing changes without complication which is good news. With that being said I do think that she unfortunately is still having a lot of pain but I think this is due to the swelling that really is not controlled and as I discussed with her last time she needs to be using her lymphedema pumps there is still in the box she has not even attempted to use those whatsoever. I am also not even certain she is taking her fluid pills as she states she may need to have "get a refill" she was seen with her husband at the appointment today. 10/24/2020 upon evaluation today patient appears to be doing decently well in regard to her legs all things considered. She has been tolerating the dressing changes without complication. Fortunately there is no signs of active infection at this time. No fevers, chills, nausea, vomiting, or diarrhea. With that being said the patient does appear to have some excessive thickened skin buildup currently that  has been require some sharp debridement to clear this away. I am hopeful if we do this we will be able to get some of this area to dry out this otherwise trapping fluid underneath. Her son is present during the office visit today. She tells me she still been doing a lot as far as cleaning up and going through boxes she tells me she has been up for a long time already this morning. 5/25; patient presents for 2-week follow-up. She had 3 layer compression wrap placed with calcium alginate underneath. She reports tolerating the wraps well. She has home health that changes the wrap 2-3 times a week. She denies signs of infection. She has almost finished her course of antibiotics. She  uses her lymphedema pumps once weekly. 11/21/2020 upon evaluation today patient appears to be doing well with regard to her legs all things considered. Fortunately there is no signs of active infection at this time which is great news I do not see any evidence of infection and overall I think that she is definitely improved compared to where things did previous. Nonetheless I do think that she may benefit from removing some of the thicker skin on the legs. Again we have attempted this in the past to some degree but I think that we might be able to more effectively do this of a clear some way and then potentially use a urea cream with salicylic acid to try to help clear some of this away. The patient is in agreement with the plan. 12/05/2020 upon evaluation today patient appears to be doing well with regard to her wounds in general. I feel like that things are somewhat improved the skin looking a little better. With that being said she still has significant lymphedema. She is not been using her lymphedema pumps and not really been elevating her legs. She also did have some maggots noted on the left leg she tells me that the "gnats are terrible and get all over her foot." With that being said I am concerned about the fact that the patient still is quite swollen even with the compression wraps I really feel like she needs to be using her pumps but I cannot talk her into doing this. 12/19/2020 upon evaluation today patient appears to be doing about the same in regard to her legs. Fortunately I do not see any signs of infection unfortunately she really seems to be very nonchalant about the fact that she has not been using her lymphedema pumps. She tells me that "I just forget". With that being said this needs to be something that is a priority for her I cannot count the number of times that have had this discussion with her and yet nothing seems to change. With that being said I think that is very unlikely she  is ever really getting healed with regard to her wounds to be honest she does not seem to be motivated to do anything to try to help at this point despite everything that I have tried to do and recommend for her. 01/02/2021 upon evaluation today patient actually appears to be doing excellent in regard to her wounds. She has been tolerating the dressing changes without complication. Fortunately there does not appear to be any signs of active infection at this time which is great news. No fevers, chills, nausea, vomiting, or diarrhea. 01/23/2021 upon evaluation today patient appears to be doing worse in general in regard to her wounds. This is due to the fact that  she is not been using her compression pumps which again she knows when she does not it always turns out poorly for her. Unfortunately the issue here today is that she is continuing to have troubles with her son she seems to be very depressed and I am truly sorry for her and everything she is going through. With that being said I do not know what to do to make things better if she want elevate her legs and use her lymphedema pumps there is really not have anything I can do to correct or fix the situation for her to be honest. I discussed all this with her today. 02/20/2021 upon evaluation today the patient unfortunately has been in the hospital due to cellulitis. Again this is something that is been an ongoing issue based on what I am seeing I think that she unfortunately still is having trouble complying with anything that we recommend. She has not been sleeping in her bed she tells me that she continues sleeping really in her motorized wheelchair that sweats been the case for the past several visits that have seen her despite me counseling to the opposite that she should be trying to sleep in her bed where she could get her legs off the ground and allow some of the swelling edema to come down. Subsequently also it appears that someone not sure his  primary care otherwise has given orders for her to not be wrapped and subsequently to be washing her legs daily which she is not doing. This was prior to her going into the hospital. She also tells me she is not been using her lymphedema pumps even before the infection setting and she went to the hospital. She also tells me that she does not always take her fluid pills and again the compression wraps have not been in place. Pretty much she has not done anything that I recommended up to this point. Again I had this conversation with her multiple times and unfortunately just does not seem to be sinking in that she really needs to be taking care of herself better. She is extremely noncompliant. 03/13/2021 upon evaluation today patient appears to be doing poorly still in regard to her legs. She is still not using any of the compression wrapping at this time on a regular basis and that she is taken it off. I discussed with her that she needs to not be doing this that that is definitely something that is going to cause her long-term trouble and definitely not help her to heal. She is also not use her lymphedema pumps but 1 time since last time I saw her 2 weeks ago based on what I am hearing. Nonetheless my issue here is simply that compliance is a big factor I think in her not getting better and this continues to be a problem. I think that I am very close to having a discussion with her that we may be discharging her due to noncompliance if she does not step things up. 03/20/2021 upon evaluation today patient appears to be doing poorly in regard to her lower extremities. She came in today which is the current layer on the wraps and no Coban. Apparently she had taken this off after we saw her last week on Wednesday. This was after I had a long discussion with her about compliance and the fact that she needed to be compliant with what we were telling her. Subsequently she had her husband take this off apparently  within  the next day following when she was here. Both on the home health nurse came in she did not have the Coban on as she told the home health nurse that we did not have her use the Coban any longer I am not really sure the reason she gave. We did contact Olivia Mackie who is the home health nurse in order to discuss this with her today. She states that that is exactly what the patient said that we were no longer using the Coban. She unfortunately had not received the directions from the office as far as orders were concerned and so did not have confirmation of this. We refax that but again apparently she never got those. Not sure what is going on in that regard but nonetheless in the interim the patient has not been wearing her compression wraps. She also has not been utilizing her lymphedema pumps she tells me "they are sitting on the table". She also has fluid pills but she tells me that she has not been using those because she does not even know where the bottle is. She tells me that she is not "in the frame of mind" to be able to comply with the treatment recommendations. Either way I discussed with her today that we are going to be initiating discharge from the wound care center due to noncompliance. Objective Constitutional Well-nourished and well-hydrated in no acute distress. Vitals Time Taken: 2:58 PM, Height: 62 in, Source: Stated, Weight: 335 lbs, Source: Stated, BMI: 61.3, Temperature: 98.3 F, Pulse: 108 bpm, Respiratory Rate: 20 breaths/min, Blood Pressure: 155/87 mmHg, Capillary Blood Glucose: 253 mg/dl. General Notes: glucose per pt report yesterday Respiratory normal breathing without difficulty. Psychiatric this patient is able to make decisions and demonstrates good insight into disease process. Alert and Oriented x 3. pleasant and cooperative. General Notes: Upon inspection patient's wound bed actually showed signs of continued and ongoing significant drainage. Fortunately there  does not appear to be signs of infection but unfortunately I think that she is just having a lot of significant amount of weeping secondary to the uncontrolled lymphedema due to multiple noncompliant reasons as dictated above. Integumentary (Hair, Skin) Wound #61 status is Open. Original cause of wound was Gradually Appeared. The date acquired was: 04/20/2019. The wound has been in treatment 100 weeks. The wound is located on the Left,Circumferential Lower Leg. The wound measures 13.5cm length x 22cm width x 0.2cm depth; 233.263cm^2 area and 46.653cm^3 volume. There is Fat Layer (Subcutaneous Tissue) exposed. There is no tunneling or undermining noted. There is a large amount of serosanguineous drainage noted. The wound margin is distinct with the outline attached to the wound base. There is small (1-33%) red, pink granulation within the wound bed. There is a large (67-100%) amount of necrotic tissue within the wound bed including Adherent Slough. Wound #64 status is Open. Original cause of wound was Gradually Appeared. The date acquired was: 06/01/2019. The wound has been in treatment 94 weeks. The wound is located on the Left,Dorsal Foot. The wound measures 2.5cm length x 0.9cm width x 0.1cm depth; 1.767cm^2 area and 0.177cm^3 volume. There is Fat Layer (Subcutaneous Tissue) exposed. There is no tunneling or undermining noted. There is a large amount of serous drainage noted. The wound margin is distinct with the outline attached to the wound base. There is medium (34-66%) red granulation within the wound bed. There is a medium (34-66%) amount of necrotic tissue within the wound bed including Adherent Slough. Wound #67 status is Open. Original  cause of wound was Gradually Appeared. The date acquired was: 05/02/2020. The wound has been in treatment 46 weeks. The wound is located on the Right,Medial Lower Leg. The wound measures 7cm length x 4cm width x 0.2cm depth; 21.991cm^2 area and 4.398cm^3  volume. There is Fat Layer (Subcutaneous Tissue) exposed. There is no tunneling or undermining noted. There is a large amount of serosanguineous drainage noted. The wound margin is distinct with the outline attached to the wound base. There is medium (34-66%) pink granulation within the wound bed. There is a medium (34-66%) amount of necrotic tissue within the wound bed including Adherent Slough. Wound #71 status is Healed - Epithelialized. Original cause of wound was Gradually Appeared. The date acquired was: 01/23/2021. The wound has been in treatment 8 weeks. The wound is located on the Right,Posterior Lower Leg. The wound measures 0cm length x 0cm width x 0cm depth; 0cm^2 area and 0cm^3 volume. There is no tunneling or undermining noted. There is a none present amount of drainage noted. There is no granulation within the wound bed. There is no necrotic tissue within the wound bed. Wound #72 status is Open. Original cause of wound was Gradually Appeared. The date acquired was: 02/20/2021. The wound has been in treatment 4 weeks. The wound is located on the Right,Dorsal Foot. The wound measures 2cm length x 3.5cm width x 0.1cm depth; 5.498cm^2 area and 0.55cm^3 volume. There is Fat Layer (Subcutaneous Tissue) exposed. There is no tunneling or undermining noted. There is a medium amount of serous drainage noted. The wound margin is distinct with the outline attached to the wound base. There is large (67-100%) red granulation within the wound bed. There is a small (1-33%) amount of necrotic tissue within the wound bed including Adherent Slough. 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 There was a Three Layer Compression Therapy Procedure by [name], RN. Post procedure Diagnosis Wound #61: Same as Pre-Procedure Wound #67 Pre-procedure diagnosis of Wound #67 is a Diabetic Wound/Ulcer of the Lower Extremity located on the Right,Medial Lower Leg . There was a Three Layer Compression Therapy Procedure by [name], RN. Post procedure Diagnosis Wound #67: Same as Pre-Procedure Plan Follow-up Appointments: Return Appointment in 2 weeks. - with [facility] Shower/ Hygiene: May shower and wash wound with soap and water. - with dressing changes, wash both legs with wash cloth and soap and water with dressing changes Other Bathing/Shower/Hygiene Orders/Instructions: - Home Health to wash legs with warm soapy water and a clean wash cloth with dressing changes Edema Control - Lymphedema / SCD / Other: Lymphedema Pumps. Use Lymphedema pumps on leg(s) 2-3 times a day for 45-60 minutes. If wearing any wraps or hose, do not remove them. Continue exercising as instructed. Elevate legs to the level of the heart or above for 30 minutes daily and/or when sitting, a frequency of: -  especially at night Avoid standing for long periods of time. Exercise regularly Home Health: No change in wound care orders this week; continue Home Health for wound care. May utilize formulary equivalent dressing for wound treatment orders unless otherwise specified. Dressing changes to be completed by [facility] on Monday / Wednesday / Friday except when patient  has scheduled visit at [facility]. Other Home Health Orders/Instructions: - [facility] ordered were: wound clinic at [facility] - treatment of bilateral lower extremity lymphedema and weeping legs WOUND #61: - Lower Leg Wound Laterality: Left, Circumferential Cleanser: Soap and Water 3 x Per Week/30 Days Discharge Instructions: May shower and wash wound with dial antibacterial soap and water prior to dressing change. Peri-Wound Care: Sween Lotion (Moisturizing lotion) (Home Health) 3 x Per Week/30 Days Discharge Instructions: Apply moisturizing lotion to dry skin on legs Prim Dressing: KerraCel Ag Gelling Fiber Dressing, 4x5 in (silver alginate) 3 x Per Week/30 Days ary Discharge Instructions: Apply silver alginate to wound bed as instructed Secondary Dressing: ABD Pad, 8x10 (Home Health) 3 x Per Week/30 Days Discharge Instructions: Apply over primary dressing as directed. Secondary Dressing: Zetuvit Plus 4x8 in (Home Health) 3 x Per Week/30 Days Discharge Instructions: Apply over primary dressing as directed. Com pression Wrap: ThreePress (3 layer compression wrap) (Home Health) 3 x Per Week/30 Days Discharge Instructions: Apply three layer compression as directed. Pad bend of ankle with foam or ABD pad. WOUND #64: - Foot Wound Laterality: Dorsal, Left Cleanser: Soap and Water 3 x Per Week/30 Days Discharge Instructions: May shower and wash wound with dial antibacterial soap and water prior to dressing change. Peri-Wound Care: Sween Lotion (Moisturizing lotion) (Home Health) 3 x Per Week/30 Days Discharge Instructions: Apply moisturizing lotion to dry skin on legs Prim Dressing: KerraCel Ag Gelling Fiber Dressing, 4x5 in (silver alginate) 3 x Per Week/30 Days ary Discharge Instructions: Apply silver alginate to wound bed as instructed Secondary Dressing: ABD Pad, 8x10 (Home Health) 3 x Per Week/30 Days Discharge Instructions: Apply over primary dressing as  directed. Secondary Dressing: Zetuvit Plus 4x4 in (Home Health) 3 x Per Week/30 Days Discharge Instructions: or equivalent extra absorbent pad.Apply over primary dressing as needed. Com pression Wrap: ThreePress (3 layer compression wrap) (Home Health) 3 x Per Week/30 Days Discharge Instructions: Apply three layer compression as directed. Pad bend of ankle with foam or ABD pad. WOUND #67: - Lower Leg Wound Laterality: Right, Medial Cleanser: Soap and Water 3 x Per Week/30 Days Discharge Instructions: May shower and wash wound with dial antibacterial soap and water prior to dressing change. Peri-Wound Care: Sween Lotion (Moisturizing lotion) (Home Health) 3 x Per Week/30 Days Discharge Instructions: Apply moisturizing lotion to dry skin on legs Prim Dressing: KerraCel Ag Gelling Fiber Dressing, 4x5 in (silver alginate) 3 x Per Week/30 Days ary Discharge Instructions: Apply silver alginate to wound bed as instructed Secondary Dressing: ABD Pad, 8x10 (Home Health) 3 x Per Week/30 Days Discharge Instructions: Apply over primary dressing as directed. Secondary Dressing: Zetuvit Plus 4x4 in (Home Health) 3 x Per Week/30 Days Discharge Instructions: or equivalent extra absorbent pad.Apply over primary dressing as needed. Com pression Wrap: ThreePress (3 layer compression wrap) (Home Health) 3 x Per Week/30 Days Discharge Instructions: Apply three layer compression as directed. Pad bend of ankle with foam or ABD pad. WOUND #72: - Foot Wound Laterality: Dorsal, Right Cleanser: Soap and Water 3 x Per Week/30 Days Discharge Instructions: May shower and wash wound with dial antibacterial  soap and water prior to dressing change. Peri-Wound Care: Sween Lotion (Moisturizing lotion) (Home Health) 3 x Per Week/30 Days Discharge Instructions: Apply moisturizing lotion to dry skin on legs Prim Dressing: KerraCel Ag Gelling Fiber Dressing, 4x5 in (silver alginate) 3 x Per Week/30 Days ary Discharge  Instructions: Apply silver alginate to wound bed as instructed Secondary Dressing: ABD Pad, 8x10 (Home Health) 3 x Per Week/30 Days Discharge Instructions: Apply over primary dressing as directed. Secondary Dressing: Zetuvit Plus 4x4 in (Home Health) 3 x Per Week/30 Days Discharge Instructions: or equivalent extra absorbent pad.Apply over primary dressing as needed. Compression Wrap: ThreePress (3 layer compression wrap) (Home Health) 3 x Per Week/30 Days Discharge Instructions: Apply three layer compression as directed. Pad bend of ankle with foam or ABD pad. 1. Based on what I am seeing currently I think that the best thing is probably can to be digested with the silver alginate and the home health nurse already has that that is what she has been using. She does need to have the compression wraps in place I do not know where she got the idea that she was not to use the Coban but that was never even close to insinuated here in the office. Nor placed in the orders. 2. With regard to her compliance with the lymphedema pumps she is not using them although she should be. 3. With regard to her fluid pills she is also not taking those. 4. Overall I do not think there is anything that I can really do for her to try to get her better I have tried everything that I can do to be honest. I think at this point that the issue simply is that she is just going to be ongoing noncompliant and honestly I do not see any need for [place] to continue to see her here in the clinic I feel like I am exhausted all means that I have try to get her better. I discussed that with her today and we can to be initiating discharge due to noncompliance from the wound care center. We will make a referral to another wound center of her choice. Otherwise we will give her 30 days to make an appointment there before discharge. We will see patient back for reevaluation in 2 weeks here in the clinic. If anything worsens or changes patient will  contact our office for additional recommendations. Electronic Signature(s) Signed: 03/20/2021 4:09:28 PM By: [name] PA-C Entered By: [name] on 03/20/2021 16:09:28 -------------------------------------------------------------------------------- SuperBill Details Patient Name: Date of Service: [name] 03/20/2021 Medical Record Number: [id number] Patient Account Number: [id number] Date of Birth/Sex: Treating RN: 1950/11/26 (70 y.o. [name] Primary Care Provider: [name] Other Clinician: Referring Provider: Treating Provider/Extender: Melchor Amour Weeks in Treatment: 189 Diagnosis Coding ICD-10 Codes Code Description E11.622 Type 2 diabetes mellitus with other skin ulcer I89.0 Lymphedema, not elsewhere classified I87.331 Chronic venous hypertension (idiopathic) with ulcer and inflammation of right lower extremity I87.332 Chronic venous hypertension (idiopathic) with ulcer and inflammation of left lower extremity L97.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 leg with fat layer exposed L97.822 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 Physician Procedures Electronic Signature(s) Signed: 03/20/2021 4:10:12 PM By: [name] PA-C Entered By: [name] on 03/20/2021 16:10:10

## 2021-03-21 NOTE — Progress Notes (Signed)
[name], [name] ([id number]) Visit Report for 03/20/2021 Arrival Information Details Patient Name: Date of Service: [name], [name] 03/20/2021 1:45 PM Medical Record Number: [id number] Patient Account Number: [id number] Date of Birth/Sex: Treating RN: [birth year] (70 y.o. [name] Primary Care Tiran Sauseda: [name] Other Clinician: Referring Indria Bishara: Treating Taima Rada/Extender: Melchor Amour Weeks in Treatment: 189 Visit Information History Since Last Visit Added or deleted any medications: No Patient Arrived: Wheel Chair Any new allergies or adverse reactions: No Arrival Time: 14:56 Had a fall or experienced change in No Accompanied By: spouse activities of daily living that may affect Transfer Assistance: Manual risk of falls: Patient Identification Verified: Yes Signs or symptoms of abuse/neglect since last visito No Secondary Verification Process Completed: Yes Hospitalized since last visit: No Patient Requires Transmission-Based Precautions: No Implantable device outside of the clinic excluding No Patient Has Alerts: Yes cellular tissue based products placed in the center Patient Alerts: R ABI= 1.01 since last visit: L ABI = .99 Has Dressing in Place as Prescribed: No Has Compression in Place as Prescribed: No Pain Present Now: Yes Notes pt only has cotton layer wrapping on both legs. States she thought the coban layer had been stopped Engineer, maintenance) Signed: 03/21/2021 6:17:04 PM By: [name] RN, BSN Entered By: [name] on 03/20/2021 14:57:39 -------------------------------------------------------------------------------- Compression Therapy Details Patient Name: Date of Service: [name]. 03/20/2021 1:45 PM Medical Record Number: [id number] Patient Account Number: [id number] Date of Birth/Sex: Treating RN: 11/04/1950 (70 y.o. [name] Primary Care Braxten Memmer: [name] Other  Clinician: Referring Beyonka Pitney: Treating Alazia Crocket/Extender: Melchor Amour Weeks in Treatment: 189 Compression Therapy Performed for Wound Assessment: Wound #61 Left,Circumferential Lower Leg Performed By: Clinician [name], RN Compression Type: Three Layer Post Procedure Diagnosis Same as Pre-procedure Electronic Signature(s) Signed: 03/21/2021 6:17:04 PM By: [name] RN, BSN Entered By: [name] on 03/20/2021 16:03:00 -------------------------------------------------------------------------------- Compression Therapy Details Patient Name: Date of Service: [name] 03/20/2021 1:45 PM Medical Record Number: [id number] Patient Account Number: [id number] Date of Birth/Sex: Treating RN: 03-28-1951 (70 y.o. [name] Primary Care Shirleymae Hauth: [name] Other Clinician: Referring Ivorie Uplinger: Treating Jensen Kilburg/Extender: Melchor Amour Weeks in Treatment: 189 Compression Therapy Performed for Wound Assessment: Wound #67 Right,Medial Lower Leg Performed By: Clinician [name], RN Compression Type: Three Layer Post Procedure Diagnosis Same as Pre-procedure Electronic Signature(s) Signed: 03/21/2021 6:17:04 PM By: [name] RN, BSN Entered By: [name] on 03/20/2021 16:03:00 -------------------------------------------------------------------------------- Lower Extremity Assessment Details Patient Name: Date of Service: [name]. 03/20/2021 1:45 PM Medical Record Number: [id number] Patient Account Number: [id number] Date of Birth/Sex: Treating RN: [birth year] (70 y.o. [name] Primary Care Michaelanthony Kempton: [name] Other Clinician: Referring Olga Bourbeau: Treating Carrin Vannostrand/Extender: Sallye Ober, Whitney Weeks in Treatment: 189 Edema Assessment Assessed: [Left: No] [Right: No] Edema: [Left: Yes] [Right: Yes] Calf Left: Right: Point of Measurement: 33 cm From Medial  Instep 46.5 cm 43 cm Ankle Left: Right: Point of Measurement: 9 cm From Medial Instep 30 cm 26 cm Vascular Assessment Pulses: Dorsalis Pedis Palpable: [Left:No] [Right:Yes] Electronic Signature(s) Signed: 03/21/2021 6:17:04 PM By: [name] RN, BSN Entered By: [name] on 03/20/2021 15:10:20 -------------------------------------------------------------------------------- [place] Details Patient Name: Date of Service: [name]. 03/20/2021 1:45 PM Medical Record Number: [id number] Patient Account Number: [id number] Date of Birth/Sex: Treating RN: [birth year] (70 y.o. [name] Primary Care Shanece Cochrane: [name] Other Clinician: Referring Bethlehem Langstaff: Treating Adyn Serna/Extender: Sallye Ober, Waldo  Weeks in Treatment: Jermyn reviewed with physician Active Inactive Venous Leg Ulcer Nursing Diagnoses: Actual venous Insuffiency (use after diagnosis is confirmed) Knowledge deficit related to disease process and management Goals: Patient will maintain optimal edema control Date Initiated: 08/12/2017 Target Resolution Date: 04/10/2021 Goal Status: Active Patient/caregiver will verbalize understanding of disease process and disease management Date Initiated: 08/12/2017 Date Inactivated: 04/21/2018 Target Resolution Date: 04/24/2018 Goal Status: Met Interventions: Assess peripheral edema status every visit. Compression as ordered Treatment Activities: Therapeutic compression applied : 08/12/2017 Notes: 03/13/21: Edema control ongoing Wound/Skin Impairment Nursing Diagnoses: Impaired tissue integrity Knowledge deficit related to ulceration/compromised skin integrity Goals: Patient/caregiver will verbalize understanding of skin care regimen Date Initiated: 08/12/2017 Target Resolution Date: 04/10/2021 Goal Status: Active Ulcer/skin breakdown will have a volume reduction of 30% by week 4 Date  Initiated: 08/05/2017 Date Inactivated: 09/30/2017 Target Resolution Date: 10/03/2017 Goal Status: Met Ulcer/skin breakdown will have a volume reduction of 50% by week 8 Date Initiated: 09/30/2017 Date Inactivated: 10/28/2017 Target Resolution Date: 10/28/2017 Goal Status: Met Interventions: Assess patient/caregiver ability to perform ulcer/skin care regimen upon admission and as needed Assess ulceration(s) every visit Provide education on ulcer and skin care Screen for HBO Treatment Activities: Patient referred to home care : 08/05/2017 Skin care regimen initiated : 08/05/2017 Topical wound management initiated : 08/05/2017 Notes: 03/13/21: Wound care regimen ongoing Electronic Signature(s) Signed: 03/21/2021 6:17:04 PM By: [name] RN, BSN Entered By: [name] on 03/20/2021 15:19:04 -------------------------------------------------------------------------------- Pain Assessment Details Patient Name: Date of Service: [name]. 03/20/2021 1:45 PM Medical Record Number: [id number] Patient Account Number: [id number] Date of Birth/Sex: Treating RN: 08-16-1950 (70 y.o. [name] Primary Care Machell Wirthlin: [name] Other Clinician: Referring Avrohom Mckelvin: Treating Gabrien Mentink/Extender: Sallye Ober, Whitney Weeks in Treatment: [phone] Active Problems Location of Pain Severity and Description of Pain Patient Has Paino Yes Site Locations Pain Location: Generalized Pain, Pain in Ulcers With Dressing Change: Yes Duration of the Pain. Constant / Intermittento Constant Rate the pain. Current Pain Level: 6 Worst Pain Level: 10 Least Pain Level: 3 Character of Pain Describe the Pain: Burning, Other: tingling Pain Management and Medication Current Pain Management: Medication: Yes Is the Current Pain Management Adequate: Adequate How does your wound impact your activities of daily livingo Sleep: No Bathing: No Appetite: No Relationship With Others:  No Bladder Continence: No Emotions: No Bowel Continence: No Work: No Toileting: No Drive: No Dressing: No Hobbies: No Electronic Signature(s) Signed: 03/21/2021 6:17:04 PM By: [name] RN, BSN Entered By: [name] on 03/20/2021 15:00:06 -------------------------------------------------------------------------------- Patient/Caregiver Education Details Patient Name: Date of Service: [name] 10/5/2022andnbsp1:45 PM Medical Record Number: [id number] Patient Account Number: [id number] Date of Birth/Gender: Treating RN: [birth year] (70 y.o. [name] Primary Care Physician: [name] Other Clinician: Referring Physician: Treating Physician/Extender: Melchor Amour Weeks in Treatment: [phone] Education Assessment Education Provided To: Patient Education Topics Provided Venous: Methods: Explain/Verbal Responses: Reinforcements needed, State content correctly Wound/Skin Impairment: Methods: Explain/Verbal Responses: Reinforcements needed, State content correctly Electronic Signature(s) Signed: 03/21/2021 6:17:04 PM By: [name] RN, BSN Entered By: [name] on 03/20/2021 15:19:55 -------------------------------------------------------------------------------- Wound Assessment Details Patient Name: Date of Service: [name]. 03/20/2021 1:45 PM Medical Record Number: [id number] Patient Account Number: [id number] Date of Birth/Sex: Treating RN: [birth year] (70 y.o. [name] Primary Care Yohannes Waibel: [name] Other Clinician: Referring Broady Lafoy: Treating Houa Ackert/Extender: Sallye Ober, Whitney Weeks in Treatment: 189 Wound Status Wound Number: 61 Primary Venous Leg Ulcer  Etiology: Wound Location: Left, Circumferential Lower Leg Wound Open Wounding Event: Gradually Appeared Status: Date Acquired: 04/20/2019 Comorbid Asthma, Hypertension, Peripheral Arterial Disease,  Peripheral Weeks Of Treatment: 100 History: Venous Disease, Type II Diabetes, Gout, Osteoarthritis Clustered Wound: Yes Photos Wound Measurements Length: (cm) 13.5 Width: (cm) 22 Depth: (cm) 0.2 Area: (cm) 233.263 Volume: (cm) 46.653 % Reduction in Area: -2993.7% % Reduction in Volume: -6087.4% Epithelialization: Small (1-33%) Tunneling: No Undermining: No Wound Description Classification: Full Thickness Without Exposed Support Structures Wound Margin: Distinct, outline attached Exudate Amount: Large Exudate Type: Serosanguineous Exudate Color: red, brown Foul Odor After Cleansing: No Slough/Fibrino Yes Wound Bed Granulation Amount: Small (1-33%) Exposed Structure Granulation Quality: Red, Pink Fascia Exposed: No Necrotic Amount: Large (67-100%) Fat Layer (Subcutaneous Tissue) Exposed: Yes Necrotic Quality: Adherent Slough Tendon Exposed: No Muscle Exposed: No Joint Exposed: No Bone Exposed: No Electronic Signature(s) Signed: 03/21/2021 6:17:04 PM By: [name] RN, BSN Entered By: [name] on 03/20/2021 15:17:16 -------------------------------------------------------------------------------- Wound Assessment Details Patient Name: Date of Service: [name]. 03/20/2021 1:45 PM Medical Record Number: [id number] Patient Account Number: [id number] Date of Birth/Sex: Treating RN: 06-25-1950 (70 y.o. [name] Primary Care Rolonda Pontarelli: [name] Other Clinician: Referring Charlisa Cham: Treating Risha Barretta/Extender: Sallye Ober, Whitney Weeks in Treatment: 189 Wound Status Wound Number: 64 Primary Diabetic Wound/Ulcer of the Lower Extremity Etiology: Wound Location: Left, Dorsal Foot Wound Open Wounding Event: Gradually Appeared Status: Date Acquired: 06/01/2019 Comorbid Asthma, Hypertension, Peripheral Arterial Disease, Peripheral Weeks Of Treatment: 94 History: Venous Disease, Type II Diabetes, Gout, Osteoarthritis Clustered  Wound: No Photos Wound Measurements Length: (cm) 2.5 Width: (cm) 0.9 Depth: (cm) 0.1 Area: (cm) 1.767 Volume: (cm) 0.177 % Reduction in Area: -970.9% % Reduction in Volume: -261.2% Epithelialization: Medium (34-66%) Tunneling: No Undermining: No Wound Description Classification: Grade 2 Wound Margin: Distinct, outline attached Exudate Amount: Large Exudate Type: Serous Exudate Color: amber Foul Odor After Cleansing: No Slough/Fibrino Yes Wound Bed Granulation Amount: Medium (34-66%) Exposed Structure Granulation Quality: Red Fascia Exposed: No Necrotic Amount: Medium (34-66%) Fat Layer (Subcutaneous Tissue) Exposed: Yes Necrotic Quality: Adherent Slough Tendon Exposed: No Muscle Exposed: No Joint Exposed: No Bone Exposed: No Electronic Signature(s) Signed: 03/21/2021 6:17:04 PM By: [name] RN, BSN Entered By: [name] on 03/20/2021 15:18:10 -------------------------------------------------------------------------------- Wound Assessment Details Patient Name: Date of Service: [name]. 03/20/2021 1:45 PM Medical Record Number: [id number] Patient Account Number: [id number] Date of Birth/Sex: Treating RN: 06-20-1950 (70 y.o. [name] Primary Care Brittany Osier: [name] Other Clinician: Referring Ferrin Liebig: Treating Celena Lanius/Extender: Sallye Ober, Whitney Weeks in Treatment: 189 Wound Status Wound Number: 67 Primary Diabetic Wound/Ulcer of the Lower Extremity Etiology: Wound Location: Right, Medial Lower Leg Wound Open Wounding Event: Gradually Appeared Status: Date Acquired: 05/02/2020 Comorbid Asthma, Hypertension, Peripheral Arterial Disease, Peripheral Weeks Of Treatment: 46 History: Venous Disease, Type II Diabetes, Gout, Osteoarthritis Clustered Wound: No Photos Wound Measurements Length: (cm) 7 Width: (cm) 4 Depth: (cm) 0.2 Area: (cm) 21.991 Volume: (cm) 4.398 % Reduction in Area: -1455.2% %  Reduction in Volume: -3019.1% Epithelialization: Small (1-33%) Tunneling: No Undermining: No Wound Description Classification: Grade 1 Wound Margin: Distinct, outline attached Exudate Amount: Large Exudate Type: Serosanguineous Exudate Color: red, brown Foul Odor After Cleansing: No Slough/Fibrino Yes Wound Bed Granulation Amount: Medium (34-66%) Exposed Structure Granulation Quality: Pink Fascia Exposed: No Necrotic Amount: Medium (34-66%) Fat Layer (Subcutaneous Tissue) Exposed: Yes Necrotic Quality: Adherent Slough Tendon Exposed: No Muscle Exposed: No Joint Exposed: No Bone Exposed: No Electronic Signature(s) Signed:  03/21/2021 6:17:04 PM By: [name] RN, BSN Entered By: [name] on 03/20/2021 15:19:28 -------------------------------------------------------------------------------- Wound Assessment Details Patient Name: Date of Service: [name], [name] 03/20/2021 1:45 PM Medical Record Number: [id number] Patient Account Number: [id number] Date of Birth/Sex: Treating RN: September 01, 1950 (70 y.o. [name] Primary Care Mykela Mewborn: [name] Other Clinician: Referring Jayleene Glaeser: Treating Nesiah Jump/Extender: Sallye Ober, Whitney Weeks in Treatment: 189 Wound Status Wound Number: 71 Primary Lymphedema Etiology: Wound Location: Right, Posterior Lower Leg Wound Healed - Epithelialized Wounding Event: Gradually Appeared Status: Date Acquired: 01/23/2021 Comorbid Asthma, Hypertension, Peripheral Arterial Disease, Peripheral Weeks Of Treatment: 8 History: Venous Disease, Type II Diabetes, Gout, Osteoarthritis Clustered Wound: No Photos Wound Measurements Length: (cm) Width: (cm) Depth: (cm) Area: (cm) Volume: (cm) 0 % Reduction in Area: 100% 0 % Reduction in Volume: 100% 0 Epithelialization: Large (67-100%) 0 Tunneling: No 0 Undermining: No Wound Description Classification: Full Thickness Without Exposed Support  Structures Exudate Amount: None Present Foul Odor After Cleansing: No Slough/Fibrino No Wound Bed Granulation Amount: None Present (0%) Exposed Structure Necrotic Amount: None Present (0%) Fascia Exposed: No Fat Layer (Subcutaneous Tissue) Exposed: No Tendon Exposed: No Muscle Exposed: No Joint Exposed: No Bone Exposed: No Electronic Signature(s) Signed: 03/21/2021 6:17:04 PM By: [name] RN, BSN Entered By: [name] on 03/20/2021 15:20:26 -------------------------------------------------------------------------------- Wound Assessment Details Patient Name: Date of Service: [name]. 03/20/2021 1:45 PM Medical Record Number: [id number] Patient Account Number: [id number] Date of Birth/Sex: Treating RN: 05-04-1951 (70 y.o. [name] Primary Care Shanicka Oldenkamp: [name] Other Clinician: Referring Yesly Gerety: Treating Yitzchok Carriger/Extender: Sallye Ober, Whitney Weeks in Treatment: 189 Wound Status Wound Number: 72 Primary Lymphedema Etiology: Wound Location: Right, Dorsal Foot Secondary Diabetic Wound/Ulcer of the Lower Extremity Wounding Event: Gradually Appeared Etiology: Date Acquired: 02/20/2021 Wound Open Weeks Of Treatment: 4 Status: Clustered Wound: No Comorbid Asthma, Hypertension, Peripheral Arterial Disease, Peripheral History: Venous Disease, Type II Diabetes, Gout, Osteoarthritis Photos Wound Measurements Length: (cm) 2 Width: (cm) 3.5 Depth: (cm) 0.1 Area: (cm) 5.498 Volume: (cm) 0.55 % Reduction in Area: -94.5% % Reduction in Volume: -94.3% Epithelialization: Medium (34-66%) Tunneling: No Undermining: No Wound Description Classification: Full Thickness Without Exposed Support Structures Wound Margin: Distinct, outline attached Exudate Amount: Medium Exudate Type: Serous Exudate Color: amber Foul Odor After Cleansing: No Slough/Fibrino Yes Wound Bed Granulation Amount: Large (67-100%) Exposed  Structure Granulation Quality: Red Fascia Exposed: No Necrotic Amount: Small (1-33%) Fat Layer (Subcutaneous Tissue) Exposed: Yes Necrotic Quality: Adherent Slough Tendon Exposed: No Muscle Exposed: No Joint Exposed: No Bone Exposed: No Electronic Signature(s) Signed: 03/21/2021 6:17:04 PM By: [name] RN, BSN Entered By: [name] on 03/20/2021 15:21:04 -------------------------------------------------------------------------------- [place]ian Details Patient Name: Date of Service: [name]. 03/20/2021 1:45 PM Medical Record Number: [id number] Patient Account Number: [id number] Date of Birth/Sex: Treating RN: [birth year] (70 y.o. [name] Primary Care Maleeha Halls: [name] Other Clinician: Referring Akaila Rambo: Treating Xavion Muscat/Extender: Sallye Ober, Whitney Weeks in Treatment: 189 Vital Signs Time Taken: 14:58 Temperature (F): 98.3 Height (in): 62 Pulse (bpm): 108 Source: Stated Respiratory Rate (breaths/min): 20 Weight (lbs): 335 Blood Pressure (mmHg): 155/87 Source: Stated Capillary Blood Glucose (mg/dl): 253 Body Mass Index (BMI): 61.3 Reference Range: 80 - 120 mg / dl Notes glucose per pt report yesterday Electronic Signature(s) Signed: 03/21/2021 6:17:04 PM By: [name] RN, BSN Entered By: [name] on 03/20/2021 14:59:18

## 2021-04-03 ENCOUNTER — Other Ambulatory Visit: Payer: Self-pay

## 2021-04-03 ENCOUNTER — Encounter (HOSPITAL_BASED_OUTPATIENT_CLINIC_OR_DEPARTMENT_OTHER): Payer: Medicare PPO | Admitting: Physician Assistant

## 2021-04-03 DIAGNOSIS — E11622 Type 2 diabetes mellitus with other skin ulcer: Secondary | ICD-10-CM | POA: Diagnosis not present

## 2021-04-03 NOTE — Progress Notes (Addendum)
[name], [name] ([id number]) Visit Report for 04/03/2021 Chief Complaint Document Details Patient Name: Date of Service: [name] 04/03/2021 10:15 A M Medical Record Number: [id number] Patient Account Number: [id number] Date of Birth/Sex: Treating RN: August 06, 1950 (70 y.o. [name] Primary Care Provider: [name] Other Clinician: Referring Provider: Treating Provider/Extender: Sallye Ober, Loree Fee Weeks in Treatment: [place] from: Patient Chief Complaint Bilateral reoccurring LE ulcers Electronic Signature(s) Signed: 04/03/2021 10:28:58 AM By: [name] PA-C Entered By: [name] on 04/03/2021 10:28:57 -------------------------------------------------------------------------------- HPI Details Patient Name: Date of Service: [name]. 04/03/2021 10:15 A M Medical Record Number: [id number] Patient Account Number: [id number] Date of Birth/Sex: Treating RN: January 20, 1951 (70 y.o. [name] Primary Care Provider: [name] Other Clinician: Referring Provider: Treating Provider/Extender: Melchor Amour Weeks in Treatment: 191 History of Present Illness HPI Description: this patient has been seen a couple of times before and returns with recurrent problems to her right and left lower extremity with swelling and weeping ulcerations due to not wearing her compression stockings which she had been advised to do during her last discharge, at the end of June 2018. During her last visit the patient had had normal arterial blood flow and her venous reflux study did not necessitate any surgical intervention. She was recommended compression and elevation and wound care. After prolonged treatment the patient was completely healed but she has been noncompliant with wearing or compressions.. She was here last week with an outpatient return visit planned but the patient came in a very poor general  condition with altered mental status and was rushed to the ER on my request. With a history of hypertension, diabetes, TIA and right-sided weakness she was set up for an MRI on her brain and cervical spine and was sent to [facility]. Getting an MRI done was very difficult but once the workup was done she was found not to have any spinal stenosis, epidural abscess or hematoma or discitis. This was radiculopathy to be treated as an outpatient and she was given a follow-up appointment. Today she is feeling much better alert and oriented and has come to reevaluate her bilateral lower extremity lymphedema and ulceration 03/25/2017 -- she was admitted to the hospital on 03/16/2017 and discharged on 03/18/2017 with left leg cellulitis and ulceration. She was started on vancomycin and Zosyn and x-ray showed no bony involvement. She was treated for a cellulitis with IV antibiotics changed to Rocephin and Flagyl and was discharged on oral Keflex and doxycycline to complete a 7 day course. Last hemoglobin A1c was 7.1 and her other ailments including hypertension got asthma were appropriately treated. 05/06/2017 -- she is awaiting the right size of compression stockings from [place] but other than that has been doing well. ====== Old notes 70 year old patient was seen one time last October and was lost to follow-up. She has recurrent problems with weeping and ulceration of her left lower extremity and has swelling of this for several years. It has been worse for the last 2 months. Past medical history is significant for diabetes mellitus type 2, hypertension, gout, morbid obesity, depressive disorders, hiatal hernia, migraines, status post knee surgery, risk of a cholecystectomy, vaginal hysterectomy and breast biopsy. She is not a smoker. As noted before she has never had a venous duplex study and an arterial ABI study was attempted but the left lower extremity was noncompressible 10/01/2016 -- had a lower  extremity venous duplex reflux evaluation which showed  no evidence of deep vein reflux in the right or left lower extremity, and no evidence of great saphenous vein reflux more than 500 ms in the right or left lower extremity, and the left small saphenous vein is incompetent but no vascular consult was recommended. review of her electronic medical records noted that the ABI was checked in July 2017 where the right ABI was normal limits and the left ABI could not be ascertained due to pain with cuff pressure but the waveforms are within normal limits. her arterial duplex study scheduled for April 27. 10/08/2016 -- the patient has various reasons for not having a compression on and for the last 3 days she has had no compression on her left lower extremity either due to pain or the lack of nursing help. She does not use her juxta lites either. 10/15/2016 -- the patient did not keep her appointment for arterial duplex study on April 27 and I have asked her to reschedule this. Her pain is out of proportion with the physical findings and she continuously fails to wear a compression wraps and cuts them off because she says she cannot tolerate the pain. She does not use her juxta lites either. 10/22/2016 -- he has rescheduled her arterial duplex study to May 21 and her pain today is a bit better. She has not been wearing her juxta lites on her right lower extremity but now understands that she needs to do this. She did tolerate the to press compression wrap on her left lower extremity 10/29/2016 --arterial duplex study is scheduled for next week and overall she has been tolerating her compression wraps and also using her juxta lites on her right lower extremity 11/05/2016 -- the right ABI was 0.95 the left was 1.03. The digit TBI is on the right was 0.83 on the left was 0.92 and she had biphasic flow through these vessels. The impression was that of normal lower extremity arterial study. 11/12/2016 -- her  pain is minimal and she is doing very well overall. 11/26/2016 -- she has got juxta lites and her insurance will not pay for additional dual layer compression stockings. She is going to order some from [place]. 05/12/2017 -- her juxta lites are very old and too big for her and these have not been helping with compression. She did get 20-30 mm compression stockings from [place] but she and her husband are unable to put these on. I believe she will benefit from bilateral Extremit-ease, compression stockings and we will measure her for these today. 05/20/2017 -- lymphedema on the left lower extremity has increased a lot and she has a open ulceration as a result of this. The right lower extremity is looking pretty good. She has decided to by the compression stockings herself and will get reimbursed by the home health, at a later date. 05/27/2017 -- her sciatica is bothering her a lot and she thought her left leg pain was caused due to the compression wrap and hence removed it and has significant lymphedema. There is no inflammation on this left lower extremity. 06/17/17 on evaluation today patient appears to be doing very well and in fact is completely healed in regard to her ulcerations. Unfortunately however she does have continued issues with lymphedema nonetheless. We did order compression garments for her unfortunately she states that the size that she received were large although we ordered medium. Obviously this means she is not getting the optimal compression. She does not have those with her today and therefore  we could not confirm and contact the company on her behalf. Nonetheless she does state that she is going to have her husband bring them by tomorrow so that we can verify and then get in touch with the company. No fevers, chills, nausea, or vomiting noted at this time. Overall patient is doing better otherwise and I'm pleased with the progress she has made. 07/01/17 on evaluation today  patient appears to be doing very well in regard to her bilateral lower extremity she does not have any openings at this point which is excellent news. Overall I'm pleased with how things have progressed up to this time. Since she is doing so well we did order her compression which we are seeing her today to ensure that it fits her properly and everything is doing well in that regard and then subsequently she will be discharged. ============ Old Notes: 03/31/16 patient presents today for evaluation concerning open wounds that she has over the left medial ankle region as well as the left dorsal foot. She has previously had this occur although it has been healed for a number of months after having this for about a year prior until her hospitalization on 01/05/16. At that point in time it appears that she was admitted to the hospital for left lower extremity cellulitis and was placed on vancomycin and Zosyn at that point. Eventually upon discharge on January 15, 2016 she was placed on doxycycline at that point in time. Later on 03/27/16 positive wound culture growing Escherichia coli this was switched to amoxicillin. Currently she tells me that she is having pain radiated to be a 7 out of 10 which can be as high as 10 out of 10 with palpation and manipulation of the wound. This wound appears to be mainly venous in nature due to the bilateral lower extremity venous stasis/lymphedema. This is definitely much worse on her left than the right side. She does have type 1 diabetes mellitus, hypertension, morbid obesity, and is wheelchair dependent.during the course of the hospital stay a blood culture was also obtained and fortunately appeared negative. She also had an x-ray of the tibia/fibula on the left which showed no acute bone abnormality. Her white blood cell count which was performed last on 03/25/16 was 7.3, hemoglobin 12.8, protein 7.1, albumin 3.0. Her urine culture appeared to be negative for any  specific organisms. Patient did have a left lower extremity venous duplex evaluation for DVT . This did not include venous reflux studies but fortunately was negative for DVT Patient also had arterial studies performed which revealed that she had a . normal ABI on the right though this was unable to be performed on the left secondary to pain that she was having around the ankle region due to the wound. However it was stated on report that she had biphasic pulses and apparently good blood flow. ========== 06/03/17 she is here in follow-up evaluation for right lower extremity ulcer. The right lower sure he has healed but she has reopened to the left medial malleolus and dorsal foot with weeping. She is waiting for new compression garments to arrive from home health, the previous compression garments were ill fitting. We will continue with compression bilaterally and follow-up in 2 weeks Readmission: 08/05/17 on evaluation today patient appears to be doing somewhat poorly in regard to her left lower extremity especially although the right lower extremity has a small area which may no longer be open. She has been having a lot of drainage from the  left lower extremity however he tells me that she has not been able to use the EXTREMIT-EASE Compression at this point. She states that she did better and was able to actually apply the Juxta-Lite compression although the wound that she has is too large and therefore really does not compress which is why she cannot wear it at this point. She has no one who can help her put it on regular basis her son can sometimes but he's not able to do it most of the time. I do believe that's why she has begun to weave and have issues as she is currently yet again. No fevers, chills, nausea, or vomiting noted at this time. Patient is no evidence of dementia. 08/12/17 on evaluation today patient appears to still be doing fairly well in regard to the draining areas/weeping areas  at this point. With that being said she unfortunately did go to the ER yesterday due to what was felt to be possibly a cellulitis. They place her on doxycycline by mouth and discharge her home. She definitely was not admitted. With that being said she states she has had more discomfort which has been unusual for her even compared to prior times and she's had infections.08/12/17 on evaluation today patient appears to still be doing fairly well in regard to the draining areas/weeping areas at this point. With that being said she unfortunately did go to the ER yesterday due to what was felt to be possibly a cellulitis. They place her on doxycycline by mouth and discharge her home. She definitely was not admitted. With that being said she states she has had more discomfort which has been unusual for her even compared to prior times and she's had infections. 08/19/17 put evaluation today patient tells me that she's been having a lot of what sounds to be neuropathic type pain in regard to her left lower extremity. She has been using over-the-counter topical bins again which some believe. That in order to apply the she actually remove the wrap we put on her last Wednesday on Thursday. Subsequently she has not had anything on compression wise since that time. The good news is a lot of the weeping areas appear to have closed at this point again I believe she would do better with compression but we are struggling to get her to actually use what she needs to at this point. No fevers, chills, nausea, or vomiting noted at this time. 09/03/17 on evaluation today patient appears to be doing okay in regard to her lower extremities in regard to the lymphedema and weeping. Fortunately she does not seem to show any signs of infection at this point she does have a little bit of weeping occurring in the right medial malleolus area. With that being said this does not appear to be too significant which is good news. 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 She has severe skin damage secondary to both of these features involving the dorsal left foot and medial left ankle and lower leg. Still has open areas in the left anterior foot. The area on the right closed over. She uses her own juxta light stockings. She does not have an arterial issue 09/16/17 on evaluation today patient actually appears to be doing excellent in regard to her bilateral lower extremity swelling. The Juxta-Lite compression wrap seem to be doing very well for her. She has not however been using the portion that goes over her foot.  Her left foot still is draining a little bit not nearly as significant as it has been in the past but still I do believe that she likely needs to utilize the full wrap including the foot portion of this will improve as well. She also has been apparently putting on a significant amount of Vaseline which also think is not helpful for her. I recommended that if she feels she needs something for moisturizer Eucerin will probably be better. 09/30/17 on evaluation today patient presents with several new open areas in regard to her left lower extremity although these appear to be minimal and mainly seem to be more moisture breakdown than anything. Fortunately she does not seem to have any evidence of infection which is great news. She has been tolerating the dressing changes without complication we are using silver alginate on the foot she has been using AB pads to have the legs and using her Juxta- Lite compression which seems to be controlling her swelling very well. Overall I'm pleased with the poor way she has progressed. 10/14/17 on evaluation today patient appears to be doing better in regard to her left lower extremity areas of weeping. She does still have some discomfort although in general this does not appear to be as macerated and I think it is progressing  nicely. I do think she still needs to wear the foot portion of her Juxta- Lite in order to get the most benefit from the wrap obviously. She states she understands. Fortunately there does not appear to be evidence of infection at this time which is great news. 10/28/17 on evaluation today patient appears to be doing excellent in regard to her left lower extremity. She has just a couple areas that are still open and seem to be causing any trouble whatsoever. For that reason I think that she is definitely headed in the right direction the spots are very tiny compared to what we have been dealing with in the past. 11/11/17 on evaluation today patient appears to have a right lateral lower extremity ulcer that has opened since I last saw her. She states this is where the home health nurse that was coming out remove the dressing without wetting the alginate first. Nonetheless I do not know if this is indeed the case or not but more importantly we have not ordered home help to be coming out for her wounds at all. I'm unsure as to why they are coming out and we're gonna have to check on this and get things situated in that regard. With that being said we currently really do not need them to be coming out as the patient has been taking care of her leg herself without complication and no issues. In fact she was doing much better prior to nursing coming out. 11/25/17 on evaluation today patient actually appears to be doing fairly well in regard to her left lower extremity swelling. In fact she has very little area of weeping at this point there's just a small spot on the lateral portion of her right leg that still has me just a little bit more concerned as far as wanting to see this clear up before I discharge her to caring for this at home. Nonetheless overall she has made excellent progress. 12/09/17 on evaluation today patient appears to be doing rather well in regard to her lower extremity edema. She does have  some weeping still in the left lower extremity although the big area we were taking care of two  weeks ago actually has closed and she has another area of weeping on the left lower extremity immediately as well is the top of her foot. She does not currently have lymphedema pumps she has been wearing her compression daily on a regular basis as directed. With that being said I think she may benefit from lymphedema pumps. 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 We had a very difficult time getting and keeping this under control. 12/23/17 on evaluation today patient actually appears to be doing a little bit more poorly in regard to her bilateral lower extremities. She has been tolerating the Juxta-Lite compression wraps. Unfortunately she has two new ulcers on the right lower extremity and left lower Trinity ulceration seems to be larger. Obviously this is not good news. She has been tolerating the dressings without complication. 12/30/17 on evaluation today patient actually appears to be doing much better in regard to her bilateral lower extremity edema. She continues to have some issues with ulcerations and in fact there appears to be one spot on each leg where the wrap may have caused a little bit of a blister which is subsequently opened up at this point is given her pain. Fortunately it does not appear to be any evidence of infection which is good news. No fevers chills noted. 01/13/18 on evaluation today patient appears to be doing rather well in regard to her bilateral lower extremities. The dressings did get kind of stuck as far as the wound beds are concerned but again I think this is mainly due to the fact that she actually seems to be showing signs of healing which is good news. She's not having as much drainage therefore she was having more of  the dressing sticking. Nonetheless overall I feel like her swelling is dramatically down compared to previous. 01/20/18 on evaluation today patient unfortunately though she's doing better in most regards has a large blister on the left anterior lower extremity where she is draining quite significantly. Subsequently this is going to need debridement today in order to see what's underneath and ensure she does not continue to trapping fluid at this location. Nonetheless No fevers, chills, nausea, or vomiting noted at this time. 01/27/18 on evaluation today patient appears to be doing rather well at this point in regard to her right lower extremity there's just a very small area that she still has open at this point. With that being said I do believe that she is tolerating the compression wraps very well in making good progress. Home health is coming out at this point to see her. Her left lower extremity on the lateral portion is actually what still mainly open and causing her some discomfort for the most part 02/10/18 on evaluation today patient actually appears to be doing very well in regard to her right lower extremity were all the ulcers appear to be completely close. In regard to the left lower extremity she does have two areas still open and some leaking from the dorsal surface of her foot but this still seems to be doing much better to me in general. 02/24/18 on evaluation today patient actually appears to be doing much better in regard to her right lower extremity this is still completely healed. Her left lower extremity is also doing much better fortunately she has no evidence of infection. The one  area that is gonna require some debridement is still on the left anterior shin. Fortunately this is not hurting her as badly today. 03/10/18 on evaluation today patient appears to be doing better in some regards although she has a little bit more open area on the dorsal foot and she also has some issues on  the medial portion of the left lower extremity which is actually new and somewhat deep. With that being said there fortunately does not appear to be any significant signs of infection which is good news. No fevers, chills, nausea, or vomiting noted at this time. In general her swelling seems to be doing fairly well which is good news. 03/31/18 on evaluation today patient presents for follow-up concerning her left lower extremity lymphedema. Unfortunately she has been doing a little bit more poorly since I last saw her in regard to the amount of weeping that she is experiencing. She's also having some increased pain in the anterior shin location. Unfortunately I do not feel like the patient is making such good progress at this point a few weeks back she was definitely doing much better. 04/07/18 on evaluation today patient actually appears to be showing some signs of improvement as far as the left lower extremity is concerned. She has been tolerating the dressing changes and it does appear that the Drawtex did better for her. With that being said unfortunately home health is stating that they cannot obtain the Drawtex going forward. Nonetheless we're gonna have to check and see what they may be able to get the alginate they were using was getting stuck in causing new areas of skin being pulled all that with and subsequently weep and calls her to worsen overall this is the first time we've seen improvement at this time. 04/14/18 on evaluation today patient actually appears to be doing rather well at this point there does not appear to be any evidence of infection at this time and she is actually doing excellent in regard to the weeping in fact she almost has no openings remaining even compared to just last week this is a dramatic improvement. No fevers chills noted 04/21/18 evaluation today patient actually appears to be doing very well. She in fact is has a small area on the posterior lower extremity  location and she has a small area on the dorsal surface of her foot that are still open both of which are very close to closing. We're hoping this will be close shortly. She brought her Juxta-Lite wrap with her today hoping that would be able to put her in it unfortunately I don't think were quite at that point yet but we're getting closer. 04/28/18 upon evaluation today patient actually appears to be doing excellent in regard to her left lower extremity ulcer. In fact the region on the posterior lower extremity actually is much smaller than previously noted. Overall I'm very happy with the progress she has made. She again did bring her Juxta-Lite although we're not quite ready for that yet. 05/11/18 upon evaluation today patient actually appears to be doing in general fairly well in regard to her left lower Trinity. The swelling is very well controlled. With that being said she has a new area on the left anterior lower extremity as well as between the first and second toes of her left foot that was not present during the last evaluation. The region of her posterior left lower extremity actually appears to be almost completely healed. T be honest I'm very pleased  with o the way that stands. Nonetheless I do believe that the lotion may be keeping the area to moist as far as her legs are concerned subsequently I'm gonna consider discontinuing that today. 05/26/18 on evaluation today patient appears to be doing rather well in regard to her left lower should be ulcers. In fact everything appears to be close except for a very small area on the left posterior lower extremity. Fortunately there does not appear to be any evidence of infection at this time. Overall very pleased with her progress. 06/02/18 and evaluation today patient actually appears to be doing very well in regard to her lower extremity ulcers. She has one small area that still continues to weep that I think may benefit her being able to  justify lotion and user Juxta-Lite wraps versus continued to wrap her. Nonetheless I think this is something we can definitely look into at this point. 06/23/18 on evaluation today patient unfortunately has openings of her bilateral lower extremities. In general she seems to be doing much worse than when I last saw her just as far as her overall health standpoint is concerned. She states that her discomfort is mainly due to neuropathy she's not having any other issues otherwise. No fevers, chills, nausea, or vomiting noted at this time. 06/30/18 on evaluation today patient actually appears to be doing a little worse in regard to her right lower extremity her left lower extremity of doing fairly well. Fortunately there is no sign of infection at this time. She has been tolerating the dressing changes without complication. Home health did not come out like they were supposed to for the appropriate wrap changes. They stated that they never received the orders from [place] which were fax. Nonetheless we will send a copy of the orders with the patient today as well. 07/07/18 on evaluation today patient appears to be doing much better in regard to lower extremities. She still has several openings bilaterally although since I last saw her her legs did show obvious signs of infection when she later saw her nurse. Subsequently a culture was obtained and she is been placed on Bactrim and Keflex. Fortunately things seem to be looking much better it does appear she likely had an infection. Again last week we'd even discussed it but again there really was not any obvious sign that she had infection therefore we held off on the antibiotics. Nonetheless I'm glad she's doing better today. 07/14/18 on evaluation today patient appears to be doing much better regarding her bilateral lower Trinity's. In fact on the right lower for me there's nothing open at this point there are some dry skin areas at the sites where she had  infection. Fortunately there is no evidence of systemic infection which is excellent news. No fevers chills noted 07/21/18 on evaluation today patient actually appears to be doing much better in regard to her left lower extremity ulcers. She is making good progress and overall I feel like she's improving each time I see her. She's having no pain I do feel like the infection is completely resolved which is excellent news. No fevers, chills, nausea, or vomiting noted at this time. 07/28/18 on evaluation today patient appears to be doing very well in regard to her left lower [employer]. Everything seems to be showing signs of improvement which is excellent news. Overall very pleased with the progress that has been made. Fortunately there's no evidence of active infection at this time also excellent news. 08/04/18 on evaluation  today patient appears to be doing more poorly in regard to her bilateral lower extremities. She has two new areas open up on the right and these were completely closed as of last week. She still has the two spots on the left which in my pinion seem to be doing better. Fortunately there's no evidence of infection again at this point. 08/11/18 on evaluation today patient actually appears to be doing very well in regard to her bilateral lower Trinity wounds that all seem to be doing better and are measures smaller today. Fortunately there's no signs of infection. No fevers, chills, nausea, or vomiting noted at this time. 08/18/18 on evaluation today patient actually appears to be doing about the same inverter bilateral lower extremities. She continues to have areas that blistering open as was drain that fortunately nothing too significant. Overall I feel like Drawtex may have done better for her however compared to the collagen. 08/25/18 on evaluation today patient appears to be doing a little bit more poorly today even compared to last time I saw her. Again I'm not exactly sure  why she's making worse progress over the past several weeks. I'm beginning to wonder if there is some kind of underlying low level infection causing this issue. I did actually take a culture from the left anterior lower extremity but it was a new wound draining quite a bit at this point. Unfortunately she also seems to be having more pain which is what also makes me worried about the possibility of infection. This is despite never erythema noted at this point. 09/01/18 on evaluation today patient actually appears to be doing a little worse even compared to last week in regard to bilateral lower extremities. She did go to the hospital on the 16th was given a dose of IV Zosyn and then discharged with a recommendation to continue with the Bactrim that I previously prescribed for her. Nonetheless she is still having a lot of discomfort she tells me as well at this time. This is definitely unfortunate. No fevers, chills, nausea, or vomiting noted at this time. 09/08/18 on evaluation today patient's bilateral lower extremities actually appear to be shown signs of improvement which is good news. Fortunately there does not appear to be any signs of active infection I think the anabiotic is helping in this regard. Overall I'm very pleased with how she is progressing. 09/15/18 patient was actually seen in ER yesterday due to her legs as well unfortunately. She states she's been having a lot of pain and discomfort as well as a lot of drainage. Upon inspection today the patient does have a lot of swelling and drainage I feel like this is more related to lymphedema and poor fluid control than it is to infection based on what I'm seeing. The physician in the emergency department also doubted that the patient was having a significant infection nonetheless I see no evidence of infection obvious at this point although I do see evidence of poor fluid control. She still not using a compression pumps, she is not elevating due  to her lift chair as well as her hospital bed being broken, and she really is not keeping her legs up as much as they should be and also has been taking off her wraps. All this combined I think has led to poor fluid control and to be honest she may be somewhat volume overloaded in general as well. I recommend that she may need to contact your physician to see if a  prescription for a diuretic would be beneficial in their opinion. As long as this is safe I think it would likely help her. 09/29/18 on evaluation today patient's left lower extremity actually appears to be doing quite a bit better. At least compared to last time that I saw her. She still has a large area where she is draining from but there's a lot of new skin speckled trout and in fact there's more new skin that there are open areas of weeping and drainage at this point. This is good news. With regard to the right lower extremity this is doing much better with the only open area that I really see being a dry spot on the right lateral ankle currently. Fortunately there's no signs of active infection at this time which is good news. No fevers, chills, nausea, or vomiting noted at this time. The patient seems somewhat stressed and overwhelmed during the visit today she was very lethargic as such. She does and she is not taking any pain medications at this point. Apparently according to her husband are also in the process of moving which is probably taking its toll on her as well. 10/06/18 on evaluation today patient appears to be doing rather well in regard to her lower extremities compared to last evaluation. Fortunately there's no signs of active infection. She tells me she did have an appointment with her primary. Nonetheless he was concerned that the wounds were somewhat deep based on pictures but we never actually saw her legs. She states that he had her somewhat worried due to the fact that she was fearing now that she was [place] have  to have an amputation. With that being said based on what I'm seeing check she looks better this week that she has the last two times I've seen her with much less drainage I'm actually pleased in this regard. That doesn't mean that she's out of the water but again I do not think what the point of talking about education at all in regard to her leg. She is very happy to hear this. She is also not having as much pain as she was having last week. 10/13/18 unfortunately on evaluation today patient still continues to have a significant amount of drainage she's not letting home health actually apply the compression dressings at this point. She's trying to use of Juxta-Lite of the top of Kerlex and the second layer of the three layer compression wrap. With that being said she just does not seem to be making as good a progress as I would expect if she was having the compression applied and in place on a regular basis. No fevers, chills, nausea, or vomiting noted at this time. 10/20/18 on evaluation today patient appears to be doing a little better in regard to her bilateral lower extremity ulcers. In fact the right lower extremity seems to be healed she doesn't even have any openings at this point left lower extremity though still somewhat macerated seems to be showing signs of new skin growth at multiple locations throughout. Fortunately there's no evidence of active infection at this time. No fevers, chills, nausea, or vomiting noted at this time. 10/27/18 on evaluation today patient appears to be doing much better in regard to her left lower Trinity ulcer. She's been tolerating the laptop complication and has minimal drainage noted at this point. Fortunately there's no signs of active infection at this time. No fevers, chills, nausea, or vomiting noted at this time. 11/03/18 on evaluation today  patient actually appears to be doing excellent in regard to her left lower extremity. She is having very little  drainage at this point there does not appear to be any significant signs of infection overall very pleased with how things have gone. She is likewise extremely pleased still and seems to be making wonderful progress week to week. I do believe antibiotics were helpful for her. Her primary care provider did place on amateur clean since I last saw her. 11/17/18 on evaluation today patient appears to be doing worse in regard to her bilateral lower extremities at this point. She is been tolerating the dressing changes without complication. With that being said she typically takes the Coban off fairly quickly upon arriving home even after being seen here in the clinic and does not allow home health reapply command as part of the dressing at home. Therefore she said no compression essentially since I last saw her as best I can tell. With that being said I think it shows and how much swelling she has in the open wounds that are noted at this point. Fortunately there's no signs of infection but unfortunately if she doesn't get this under control I think she will end up with infection and more significant issues. 11/24/18 on evaluation today patient actually appears to be doing somewhat better in regard to her bilateral lower extremities. 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 Nonetheless that is doing better currently so she should be able to attempt reinitiating the lymphedema pumps at this time. No fevers, chills, nausea, or vomiting noted at this time. 12/01/18 upon evaluation today patient's left lower extremity appears to be doing quite well unfortunately her right lower extremity is not doing nearly as well. She has been tolerating the dressing changes without complication unfortunately she did not keep a wrap on the right at this time. Nonetheless I believe  this has led to increased swelling and weeping in the world is actually much larger than during the last evaluation with her. 12/08/18 on evaluation today patient appears to be doing about the same at this point in regard to her right lower extremity. There is some more palatable to touch I'm concerned about the possibility of there being some infection although I think the main issue is she's not keeping her compression wrap on which in turn is not allowing this area to heal appropriately. 12/22/18 on evaluation today patient appears to be doing better in regard to left lower extremity unfortunately significantly worse in regard to the right lower extremity. The areas of blistering and necrotic superficial tissue have spread and again this does not really appear to be signs of infection and all she just doesn't seem to be doing nearly as well is what she has been in the past. Overall I feel like the Augmentin did absolutely nothing for her she doesn't seem to have any infection again I really didn't think so last time either is more of a potential preventative measure and hoping that this would make some difference but I think the main issue is she's not wearing her compression. She tells me she cannot wear the [place] been we put on she takes it off pretty much upon getting home. Subsequently she worshiped Juxta-Lite when I questioned her about how often she wears it this is no  more than three hours a day obviously that leaves 21 hours that she has no compression and this is obviously not doing well for her. Overall I'm concerned that if things continue to worsen she is at great risk of both infection as well as losing her leg. 01/05/19 on evaluation today patient appears to be doing well in regard to her left lower extremity which he is allowing [place] to wrap and not so well with regard to her right lower extremity which she is not allowing [place] to really wrap and keep the wrap on. She states that it hurts  too badly whenever it's wrapped and she ends up having to take it off. She's been using the Juxta-Lite she tells me up to six hours a day although I question whether or not that's really been the case to be honest. Previously she told me three hours today nonetheless obviously the legs as long as the wrap is doing great when she is not is doing much more poorly. 01/12/2019 on evaluation today patient actually appears to be doing a little better in my opinion with regard to her right lower extremity ulcer. She has a small open area on the left lower extremity unfortunately but again this I think is part of the normal fluctuation of what she is going to have to expect with regard to her legs especially when she is not using her lymphedema pumps on a regular basis. Subsequently based on what I am seeing today I think that she does seem to be doing slightly better with regard to her right lower extremity she did see her primary care provider on Monday they felt she had an infection and placed her on 2 antibiotics. Both Cipro and clindamycin. Subsequently again she seems possibly to be doing a little bit better in regards to the right lower extremity she also tells me however she has been wearing the compression wrap over the past week since I spoke with her as well that is a Kerlix and Coban wrap on the right. No fevers, chills, nausea, vomiting, or diarrhea. 01/19/2019 on evaluation today patient appears to be doing better with regard to her bilateral lower extremities especially the right. I feel like the compression has been beneficial for her which is great news. She did get a call from her primary care provider on her way here today telling her that she did have methicillin- resistant Staphylococcus aureus and he was calling in a couple new antibiotics for her including a ointment to be applied she tells me 3 times a day. With that being said this sounds like likely to be Bactroban which I think could be  applied with each dressing/wrap change but I would not be able to accommodate her applying this 3 times a day. She is in agreement with the least doing this we will add that to her orders today. 01/26/2019 on evaluation today patient actually appears to be doing much better with regard to her right lower extremity. Her left lower extremity is also doing quite well all things considering. Fortunately there is no evidence of active infection at this time. No fevers, chills, nausea, vomiting, or diarrhea. 02/02/2019 on evaluation today patient appears to be doing much better compared to her last evaluation. Little by little off like her right leg is returning more towards normal. There does not appear to be any signs of active infection and overall she seems to be doing quite well which is great news. I am very pleased  in this regard. No fevers, chills, nausea, vomiting, or diarrhea. 02/09/2019 upon evaluation today patient appears to be doing better with regard to her bilateral lower extremities. She has been tolerating the dressing changes without complication. Fortunately there is no signs of active infection at this time. No fevers, chills, nausea, vomiting, or diarrhea. 02/23/2019 on evaluation today patient actually appears to be doing quite well with regard to her bilateral lower extremities. She has been tolerating the dressing changes without complication. She is even used her pumps one time and states that she really felt like it felt good. With that being said she seems to be in good spirits and her legs appear to be doing excellent. 03/09/2019 on evaluation today patient appears to be doing well with regard to her right lower extremity there are no open wounds at this time she is having some discomfort but I feel like this is more neuropathy than anything. With regard to her left lower extremity she had several areas scattered around that she does have some weeping and drainage from but again  overall she does not appear to be having any significant issues and no evidence of infection at this time which is good news. 03/23/2019 on evaluation today patient appears to be doing well with regard to her right lower extremity which she tells me is still close she is using her juxta light here. Her left lower extremity she mainly just has an area on the foot which is still slightly draining although this also is doing great. Overall very pleased at this time. 04/06/2019 patient appears to be doing a little bit worse in regard to her left lower extremity upon evaluation today. She feels like this could be becoming infected again which she had issues with previous. Fortunately there is no signs of systemic infection but again this is always a struggle with her with her legs she will go from doing well to not so well in a very short amount of time. 04/20/2019 on evaluation today patient actually appears to be doing quite well with regard to her right lower extremity I do not see any signs of active infection at this time. Fortunately there is no fever chills noted. She is still taking the antibiotics which I prescribed for her at this point. In regard to the left lower extremity I do feel like some of these areas are better although again she still is having weeping from several locations at this time. 04/27/2019 on evaluation today patient appears to be doing about the same if not slightly worse in regard to her left lower extremity ulcers. She tells me when questioned that she has been sleeping in her Hoveround chair in fact she tells me she falls asleep without even knowing it. I think she is spending a whole lot of time in the chair and less time walking and moving around which is not good for her legs either. On top of that she is in a seated position which is also the worst position she is not really elevating her legs and she is also not using her lymphedema pumps. All this is good to contribute  to worsening of her condition in general. 05/18/2019 on evaluation today patient appears to be doing well with regard to her lower extremity on the right in fact this is showing no signs of any open wounds at this time. On the left she is continuing to have issues with areas that do drain. Some of the regions have healed and  there are couple areas that have reopened. She did go to the ER per the patient according to recommendations from the home health nurse due to what she was seen when she came out on 05/13/2019. Subsequently she felt like the patient needed to go to the hospital due to the fact that again she was having "milky white discharge" from her leg. Nonetheless she had and then was placed on doxycycline and subsequently seems to be doing better. 06/01/2019 upon evaluation today patient appears to be doing really in my opinion about the same. I do not see any signs of active infection which is good news. Overall she still has wounds over the bilateral lower extremities she has reopened on the right but this appears to be more of a crack where there is weeping/edema coming from the region. I do not see any evidence of infection at either site based on what I visualized today. 07/13/2019 upon evaluation today patient appears to be doing a little worse compared to last time I saw her. She since has been in the hospital from 06/21/2019 through 06/29/2019. This was secondary to having Covid. During that time they did apply lotion to her legs which unfortunately has caused her to develop a myriad of open wounds on her lower extremities. Her legs do appear to be doing better as far as the overall appearance is concerned but nonetheless she does have more open and weeping areas. 07/27/2019 upon evaluation today patient appears to be doing more poorly to be honest in regard to her left lower extremity in particular. There is no signs of systemic infection although I do believe she may have local  infection. She notes she has been having a lot of blue/green drainage which is consistent potentially with Pseudomonas. That may be something that we need to consider here as well. The doxycycline does not seem to have been helping. 08/03/2019 upon evaluation today patient appears to be doing a little better in my opinion compared to last week's evaluation. Her culture I did review today and she is on appropriate medications to help treat the Enterobacter that was noted. Overall I feel like that is good news. With that being said she is unfortunately continuing to have a lot of drainage and though it is doing better I still think she has a long ways to go to get things dried up in general. Fortunately there is no signs of systemic infection. 08/10/2019 upon evaluation today patient appears to be doing may be slightly better in regard to her left lower extremity the right lower extremity is doing much better. Fortunately there is no signs of infection right now which is good news. No fevers, chills, nausea, vomiting, or diarrhea. 08/24/2019 on evaluation today patient appears to be doing slightly better in regard to her lower extremities. The left lower extremity seems to be healed the right lower extremity is doing better though not completely healed as far as the openings are concerned. She has some generalized issues here with edema and weeping secondary to her lymphedema though again I do believe this is little bit drier compared to prior weeks evaluations. In general I am very pleased with how things seem to be progressing. No fevers, chills, nausea, vomiting, or diarrhea. 08/31/2019 upon evaluation today patient actually seems to making some progress here with regard to the left lower extremity in particular. She has been tolerating the dressing changes without complication. Fortunately there is no signs of active infection at this time. No fevers,  chills, nausea, vomiting, or diarrhea. She did see  Dr. Doren Custard and he did note that she did have a issue with the left great saphenous vein and the small saphenous vein in the leg. With that being said he was concerned about the possibility of laser ablation not being extremely successful. He also mentioned a small risk of DVT associated with the procedure. However if the wounds do not continue to improve he stated that that would probably be the way to go. Fortunately the patient's legs do seem to be doing much better. 09/07/2019 upon evaluation today patient appears to be doing better with regard to her lower extremities. She has been tolerating the dressing changes without complication. With that being said she is showing signs of improvement and overall very pleased. There are some areas on her leg that I think we do need to debride we discussed this last week the patient is in agreement with doing that as long as it does not hurt too badly. 09/14/2019 upon evaluation today patient appears to be doing decently well with regard to her left lower extremity. She is not having near as much weeping as she has had in the past things seem to be drying up which is good news. There is no signs of active infection at this time. 09/21/19 upon evaluation today patient appears to be doing better in regard overall to her bilateral lower extremities. She again has less open than she did previous and each week I feel like this is getting better. Fortunately there is no signs of active infection at this time. No fevers, chills, nausea, vomiting, or diarrhea. 09/28/2019 upon evaluation today patient actually appears to be showing signs of improvement with regard to her left lower extremity. Unfortunately the right medial lower extremity around the ankle region has reopened to some degree but this appears to be minimal still which is good news. There is no signs of active infection at this time which is also good news. 10/12/2019 upon evaluation today patient appears to be  doing okay with regard to her bilateral lower extremities today. The right is a little bit worse then last evaluation 2 weeks ago. The left is actually doing a little better in my opinion. Overall there is no signs of active infection at this time that I see. Obviously that something we have to keep a close eye on she is very prone to this with the significant and multiple openings that she has over the bilateral lower extremities. 10/19/2019 upon evaluation today patient appears to be doing about the best that I have seen her in quite some time. She has been tolerating the dressing changes without complication. There does not appear to be any signs of active infection and overall I am extremely happy with the way her legs appeared. She is drying up quite nicely and overall is having less pain. 11/09/2019 upon evaluation today patient appears to be doing better in regard to her wounds. She seems to be drying up more and more each time I see her this is just taking a very long time. Fortunately there is no signs of active infection at this time. 11/23/2019 upon evaluation today patient actually appears to be doing excellent in regard to her lower extremities at this point compared to where she has been. Fortunately there is no signs of active infection at this time. She did go to the hospital last week for nausea and vomiting completely unrelated to her wounds. Fortunately she is doing better  she was given some Reglan and got better. She had associated abdominal pain but they never found out what was going on. 12/07/2019 upon evaluation today patient appears to be doing well for the most part in regard to her legs. She unfortunately has not been keeping the Coban portion of her wraps on therefore the compression has not really been sufficient for what it is supposed to be. Nonetheless she tells me that it just hurt too bad therefore she removed it. 12/21/2019 upon evaluation today patient actually appears to  be doing quite well with regard to her legs. I do feel like she has been making progress which is great news and overall there is no signs of active infection at this time. No fevers, chills, nausea, vomiting, or diarrhea. 01/04/2020 upon evaluation today patient presents for follow-up concerning her lower extremity edema bilaterally. She still has open wounds she has not been using her lymphedema pumps. She is also not been utilizing her compression wraps appropriately she tends to unwrap them, take them off, or states that they hurt. Obviously the reason they hurt is because her legs start to swell but the issue is if she would use her compression/lymphedema pumps regularly she would not swell and she would have the pain. Nonetheless she has not even picked them up once honestly over the past several months and may be even as much as in the past year based on my opinion and what have seen. She tells me today that after last week when I talked about this with her specifically actually that was 2 weeks ago that she "forgot". 8//21 on evaluation today patient appears to be doing a little better in regard to her legs bilaterally. Fortunately there is no signs of active infection at this time. She tells me that she used her lymphedema pumps all of one time over the past 2 weeks since I last saw her. She tells me that she has been too busy in order to continue to use these. 02/01/2020 on evaluation today patient appears to be doing some better in regard to her wounds in general in her legs. We felt the right was healed although is not completely it does appear to be doing better she tells me she has been using her lymphedema pumps that she has had this six times since I last saw her. Obviously the more she does that the better she would do my opinion 02/15/2020 upon evaluation today patient appears to be doing about the same in regard to her legs. She tells me that she is pumping I'm still not sure how  much she does to be perfectly honest. However even if she does a little bit here and there I guess that is better than nothing. Fortunately there is no sign of active infection at this time which is great news. No fevers, chills, nausea, vomiting, or diarrhea. 02/29/2020 on evaluation today patient actually appears to be doing quite well all things considered this week. She has been tolerating the dressing changes without complication. Fortunately there is no signs of active infection at this time. No fevers, chills, nausea, vomiting, or diarrhea. 03/14/2020 upon evaluation today patient appears to be doing really about the same in regard to her legs. There is no signs of improvement overall and she as noted from home health does not appear to be elevating her legs he can get into her lift chair. There is too much stuff piled up on it the patient tells me. She also  tells me she cannot really use her pumps effectively due to the fact that she cannot have any space to get them on. Finally she is also not really elevating her legs because she is not sleeping in her bed she is sleeping in her chair currently and again overall I think everything that she is done in combination has been exactly the wrong thing for what she needs for her legs. 04/04/2020 upon evaluation today patient appears to be doing well at this time with regard to her legs. She is actually been pumping, keeping her wraps on, and to be honest she seems to be doing dramatically better the right leg is excellent the left leg is also excellent and measuring much smaller than previous. 04/18/2020 upon evaluation today patient actually is continue to make good progress in regard to her lower extremities bilaterally. Everything is improving and less wet that has been in the past overall I am extremely pleased with where things stand and I think that she is making great progress. The patient tells me she still continue to use her compression  pumps 05/02/2020 on evaluation today patient appears to be doing well at this time in regard to her left leg which is showing signs of drying up. With that being said she does have a lot of lymphedema type crusty skin around the toes of her left foot and the right medial ankle which has opened at this point. Fortunately there is no signs of active infection systemically at this point or even locally for that matter. 05/23/2020 on evaluation today patient appears to be doing well with regard to her lower extremities. Fortunately there is no signs of active infection at this time. No fever chills noted. She has been very depressed however she tells me. 06/06/2020 patient came in today for evaluation in regard to her bilateral lower extremity ulcerations. With that being said she came in feeling okay and actually laughing and joking around with the staff checking her in. Subsequently however she had a coughing spell and following the coughing spell it was a dramatic conversion from being jovial and joking around to being extremely short of breath her vital signs actually dropped in regard to her blood pressure from around 175 to down around 030 for systolic and from around 96 diastolic down to around 70. With that being said she also accompanied this with an increase in her respiratory rate which was also quite significant. Nonetheless I actually upon going into see her was extremely worried we called EMS to have her transported to the ER for further evaluation and treatment. She continued until EMS got here to be extremely short of breath even on 6 L of oxygen. Her oxygen saturation did come up to 99% but overall it was only 95 before which was not terrible she did feel like the oxygen helped her feel somewhat better however. She has a history of asthma but again even the coughing spell really should not have done this degree of alteration in her demeanor from where she was just before to after the  coughing spell. She tells [place] however she has been feeling somewhat abnormal since Friday although she could not really pinpoint exactly what was going on. 06/27/2020 plan evaluation today patient appears to be doing okay in regard to her leg ulcers. She is really not showing a lot of improvement to be honest and she still has a lot of weeping. With that being said after I last saw her we called  EMS to take her to the hospital she actually did not go she went to see her primary care provider who according to the patient have not identified and read through the note states that everything checked out okay. Nonetheless the patient has continued to have bouts where she gets very short of breath for seemingly no reason whatsoever. That happened even the same night that I saw her last time. This was after she refused transport to the hospital. With that being said she also tells me that just getting dressed to come here is quite a chore which is putting on her shirt causing her to have to stop to catch her breath. Obviously this is not normal and I feel like there is something going on. She may need a referral to be seen by cardiology ASAP. She does see her primary care provider early next week she tells me 07/11/2020 upon evaluation today patient's wounds again appear to be doing about the same she mainly has weeping of the bilateral lower extremities both are very swollen today she tells me she has been using her pumps but last time I saw her she told me she did not even have her pumps out that she had "packed them away because they had too much stuff in their house. Apparently her husband has been bringing stuff in from storage units. She then subsequently told me that she had so much stuff in her house that she has been staying up all night and did not go to bed till 7:00 this morning because she was cleaning up stuff. Nonetheless I am still unsure as to whether or not she is using the pumps based on what I  am seeing I would think probably not. Nonetheless she has been keeping the compression wraps in place that is at least something 07/25/2020 upon evaluation today patient appears to be doing well currently in regard to her legs all things considered. I do not think she is doing any worse significantly although honestly I do not think she is doing a lot better either. There does not appear to be any signs of infection which is good news although she does have a lot of weeping. She is using her pumps she tells me sporadically though she says that her husband is not a big fan of helping her to get the [place]. She also tells me is difficult because in their house. It would be easiest for her to use these as where her son is actually staying with her and living out at this point. Obviously I understand that she is having a lot of issues here and to be honest I do not really know what to say in that regard and the fact that I really cannot change the situations but I do believe she really needs to be using the lymphedema pumps to try to keep things under control here. Outside of that I think she is going to continue to have significant issues. I think the pumps coupled with good compression and elevation are the only things that are to help her at this point. 08/08/2020 upon evaluation today patient appears to be doing a little worse in regard to her legs in general. I think this may be due to some infection currently. With that being said I am going to go ahead and likely see about putting her on an antibiotic. Were also can obtain a culture today where she had some purulent drainage. 08/29/2020 upon evaluation today patient appears to  be doing about the same in regard to her bilateral lower extremities. Unfortunately she is continuing to have significant issues here with edema and she has significant lymphedema. With that being said she is really not doing anything that she is supposed to be doing as far as  elevation, compression, using lymphedema pumps or anything really for that matter. She does not use her pumps, is taken the wraps off shortly after having them put on as far as home health is concerned at least by the next day she tells me, and overall is not really elevating her legs she is telling me she has a lot to do as far as cleaning up in her house. Nonetheless this triad of noncompliance is leading to worsening not improving in regard to her legs. I had a very strong conversation with her about this today again. 09/12/2020 on evaluation today patient appears to be doing poorly with regard to her lower extremities bilaterally. Fortunately there does not appear to be any signs of active infection at this point. That is systemically. Locally there is some signs of pus coming from an area on her left leg. She has been on antibiotics recently though she is done with those currently. Nonetheless she tells me she has been having increased pain with the right leg this is extremely swollen as well. Fortunately there does not appear to be significant infection of the right leg though I think her pain is really as result of the increased swelling she has been declining the wrap on the right leg and the wounds here are getting significantly worse week by week. This obviously is not will be want to see. I discussed with the patient however if she does not use the wraps, use her pumps, and elevate her legs that she is not to get better. She has been sleeping in her motorized Hoveround which does not even allow her to elevate her legs at all. She tells me that she is also constantly "cleaning up as her husband told her that he wants to move back to [place]." Unfortunately this has been the narrative for the past several months in fact that she tells me that she has been having to clean up mass that was moved in from storage units into their home and that she does not really sleep and she never really  elevates her legs. I explained to the patient today that if she is not can I do any of this that there is really nothing that I can do to help her. 09/26/20 upon evaluation today patient appears to be doing about the same in regard to her wounds. Fortunately there is no signs of active infection at this time. No fever chills noted. She has been tolerating the dressing changes without complication which is good news. With that being said I do think that she unfortunately is still having a lot of pain but I think this is due to the swelling that really is not controlled and as I discussed with her last time she needs to be using her lymphedema pumps there is still in the box she has not even attempted to use those whatsoever. I am also not even certain she is taking her fluid pills as she states she may need to have "get a refill" she was seen with her husband at the appointment today. 10/24/2020 upon evaluation today patient appears to be doing decently well in regard to her legs all things considered. She has been tolerating  the dressing changes without complication. Fortunately there is no signs of active infection at this time. No fevers, chills, nausea, vomiting, or diarrhea. With that being said the patient does appear to have some excessive thickened skin buildup currently that has been require some sharp debridement to clear this away. I am hopeful if we do this we will be able to get some of this area to dry out this otherwise trapping fluid underneath. Her son is present during the office visit today. She tells me she still been doing a lot as far as cleaning up and going through boxes she tells me she has been up for a long time already this morning. 5/25; patient presents for 2-week follow-up. She had 3 layer compression wrap placed with calcium alginate underneath. She reports tolerating the wraps well. She has home health that changes the wrap 2-3 times a week. She denies signs of infection.  She has almost finished her course of antibiotics. She uses her lymphedema pumps once weekly. 11/21/2020 upon evaluation today patient appears to be doing well with regard to her legs all things considered. Fortunately there is no signs of active infection at this time which is great news I do not see any evidence of infection and overall I think that she is definitely improved compared to where things did previous. Nonetheless I do think that she may benefit from removing some of the thicker skin on the legs. Again we have attempted this in the past to some degree but I think that we might be able to more effectively do this of a clear some way and then potentially use a urea cream with salicylic acid to try to help clear some of this away. The patient is in agreement with the plan. 12/05/2020 upon evaluation today patient appears to be doing well with regard to her wounds in general. I feel like that things are somewhat improved the skin looking a little better. With that being said she still has significant lymphedema. She is not been using her lymphedema pumps and not really been elevating her legs. She also did have some maggots noted on the left leg she tells me that the "gnats are terrible and get all over her foot." With that being said I am concerned about the fact that the patient still is quite swollen even with the compression wraps I really feel like she needs to be using her pumps but I cannot talk her into doing this. 12/19/2020 upon evaluation today patient appears to be doing about the same in regard to her legs. Fortunately I do not see any signs of infection unfortunately she really seems to be very nonchalant about the fact that she has not been using her lymphedema pumps. She tells me that "I just forget". With that being said this needs to be something that is a priority for her I cannot count the number of times that have had this discussion with her and yet nothing seems to change.  With that being said I think that is very unlikely she is ever really getting healed with regard to her wounds to be honest she does not seem to be motivated to do anything to try to help at this point despite everything that I have tried to do and recommend for her. 01/02/2021 upon evaluation today patient actually appears to be doing excellent in regard to her wounds. She has been tolerating the dressing changes without complication. Fortunately there does not appear to be any signs of  active infection at this time which is great news. No fevers, chills, nausea, vomiting, or diarrhea. 01/23/2021 upon evaluation today patient appears to be doing worse in general in regard to her wounds. This is due to the fact that she is not been using her compression pumps which again she knows when she does not it always turns out poorly for her. Unfortunately the issue here today is that she is continuing to have troubles with her son she seems to be very depressed and I am truly sorry for her and everything she is going through. With that being said I do not know what to do to make things better if she want elevate her legs and use her lymphedema pumps there is really not have anything I can do to correct or fix the situation for her to be honest. I discussed all this with her today. 02/20/2021 upon evaluation today the patient unfortunately has been in the hospital due to cellulitis. Again this is something that is been an ongoing issue based on what I am seeing I think that she unfortunately still is having trouble complying with anything that we recommend. She has not been sleeping in her bed she tells me that she continues sleeping really in her motorized wheelchair that sweats been the case for the past several visits that have seen her despite me counseling to the opposite that she should be trying to sleep in her bed where she could get her legs off the ground and allow some of the swelling edema to come down.  Subsequently also it appears that someone not sure his primary care otherwise has given orders for her to not be wrapped and subsequently to be washing her legs daily which she is not doing. This was prior to her going into the hospital. She also tells me she is not been using her lymphedema pumps even before the infection setting and she went to the hospital. She also tells me that she does not always take her fluid pills and again the compression wraps have not been in place. Pretty much she has not done anything that I recommended up to this point. Again I had this conversation with her multiple times and unfortunately just does not seem to be sinking in that she really needs to be taking care of herself better. She is extremely noncompliant. 03/13/2021 upon evaluation today patient appears to be doing poorly still in regard to her legs. She is still not using any of the compression wrapping at this time on a regular basis and that she is taken it off. I discussed with her that she needs to not be doing this that that is definitely something that is going to cause her long-term trouble and definitely not help her to heal. She is also not use her lymphedema pumps but 1 time since last time I saw her 2 weeks ago based on what I am hearing. Nonetheless my issue here is simply that compliance is a big factor I think in her not getting better and this continues to be a problem. I think that I am very close to having a discussion with her that we may be discharging her due to noncompliance if she does not step things up. 03/20/2021 upon evaluation today patient appears to be doing poorly in regard to her lower extremities. She came in today which is the current layer on the wraps and no Coban. Apparently she had taken this off after we saw her last week  on Wednesday. This was after I had a long discussion with her about compliance and the fact that she needed to be compliant with what we were telling her.  Subsequently she had her husband take this off apparently within the next day following when she was here. Both on the home health nurse came in she did not have the Coban on as she told the home health nurse that we did not have her use the Coban any longer I am not really sure the reason she gave. We did contact Olivia Mackie who is the home health nurse in order to discuss this with her today. She states that that is exactly what the patient said that we were no longer using the [place]. She unfortunately had not received the directions from the office as far as orders were concerned and so did not have confirmation of this. We refax that but again apparently she never got those. Not sure what is going on in that regard but nonetheless in the interim the patient has not been wearing her compression wraps. She also has not been utilizing her lymphedema pumps she tells me "they are sitting on the table". She also has fluid pills but she tells me that she has not been using those because she does not even know where the bottle is. She tells me that she is not "in the frame of mind" to be able to comply with the treatment recommendations. Either way I discussed with her today that we are going to be initiating discharge from the wound care center due to noncompliance. 04/03/2021 upon evaluation today patient's wounds actually appear to be doing about the same. Her son is with her today. She was accusing me today of not caring about the fact that she has pain. I explained to her that I never denied that she has pain in fact we discussed this many times and IV been in the past offered a referral to pain management though she always told me that she would talk to her primary care provider about this. Nonetheless I do not think that my stance is changed I still believe she has pain and no she has wounds and I know when her legs get really swollen they do hurt but in the past she is always done better whenever we got  things under control from the standpoint of swelling when she was using her lymphedema pumps regularly. Again I think that still the way to go she needs to be wearing a compression, using her compression pumps, and getting her legs elevated during the day as well as sleeping in bed at night. All this is of utmost importance. Again this was reiterated with her today her son was present during the evaluation. Electronic Signature(s) Signed: 04/03/2021 11:57:23 AM By: [name] PA-C Entered By: [name] on 04/03/2021 11:57:23 -------------------------------------------------------------------------------- Physical Exam Details Patient Name: Date of Service: KESLEIGH, [name] 04/03/2021 10:15 A M Medical Record Number: [id number] Patient Account Number: [id number] Date of Birth/Sex: Treating RN: [birth year] (70 y.o. [name] Primary Care Provider: [name] Other Clinician: Referring Provider: Treating Provider/Extender: Sallye Ober, Whitney Weeks in Treatment: 52 Constitutional Well-nourished and well-hydrated in no acute distress. Respiratory normal breathing without difficulty. Psychiatric this patient is able to make decisions and demonstrates good insight into disease process. Alert and Oriented x 3. pleasant and cooperative. Notes Patient's wounds currently showed signs of continued and ongoing drainage although the left leg especially I felt like  there was a little bit of improvement from the standpoint of the amount of drainage I was seeing as well as the fact that she had a few areas where there appeared to be some new tissue growing and sealing up where previously this was draining significantly. That is good news. Electronic Signature(s) Signed: 04/03/2021 11:57:55 AM By: [name] PA-C Entered By: [name] on 04/03/2021 11:57:54 -------------------------------------------------------------------------------- Physician Orders  Details Patient Name: Date of Service: [name]. 04/03/2021 10:15 A M Medical Record Number: [id number] Patient Account Number: [id number] Date of Birth/Sex: Treating RN: August 05, 1950 (70 y.o. [name] Primary Care Provider: [name] Other Clinician: Referring Provider: Treating Provider/Extender: Melchor Amour Weeks in Treatment: [phone] Verbal / Phone Orders: No Diagnosis Coding ICD-10 Coding Code Description E11.622 Type 2 diabetes mellitus with other skin ulcer I89.0 Lymphedema, not elsewhere classified I87.331 Chronic venous hypertension (idiopathic) with ulcer and inflammation of right lower extremity I87.332 Chronic venous hypertension (idiopathic) with ulcer and inflammation of left lower extremity L97.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 leg with fat layer exposed L97.822 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 I10 Essential (primary) hypertension E66.01 Morbid (severe) obesity due to excess calories F41.8 Other specified anxiety disorders R53.1 Weakness Discharge From [facility] Services Discharge from [place] - follow up with [place] wound center Bathing/ Shower/ Hygiene May shower and wash wound with soap and water. - with dressing changes, wash both legs with wash cloth and soap and water with dressing changes Other Bathing/Shower/Hygiene Orders/Instructions: - Home Health to wash legs with warm soapy water and a clean wash cloth with dressing changes Edema Control - Lymphedema / SCD / Other Bilateral Lower Extremities Lymphedema Pumps. Use Lymphedema pumps on leg(s) 2-3 times a day for 45-60 minutes. If wearing any wraps or hose, do not remove them. Continue exercising as instructed. Elevate legs to the level of the heart or above for 30 minutes daily and/or when sitting, a  frequency of: - especially at night Avoid standing for long periods of time. Exercise regularly Home Health No change in wound care orders this week; continue Home Health for wound care. May utilize formulary equivalent dressing for wound treatment orders unless otherwise specified. Dressing changes to be completed by [facility] on Monday / Wednesday / Friday except when patient has scheduled visit at [facility] Other Home Health Orders/Instructions: - [facility] Wound Treatment Wound #61 - Lower Leg Wound Laterality: Left, Circumferential Cleanser: Soap and Water 3 x Per Week/30 Days Discharge Instructions: May shower and wash wound with dial antibacterial soap and water prior to dressing change. Peri-Wound Care: Sween Lotion (Moisturizing lotion) (Home Health) 3 x Per Week/30 Days Discharge Instructions: Apply moisturizing lotion to dry skin on legs Prim Dressing: KerraCel Ag Gelling Fiber Dressing, 4x5 in (silver alginate) 3 x Per Week/30 Days ary Discharge Instructions: Apply silver alginate to wound bed as instructed Secondary Dressing: ABD Pad, 8x10 (Home Health) 3 x Per Week/30 Days Discharge Instructions: Apply over primary dressing as directed. Secondary Dressing: Zetuvit Plus 4x8 in (Home Health) 3 x Per Week/30 Days Discharge Instructions: Apply over primary dressing as directed. Compression Wrap: ThreePress (3 layer compression wrap) (Home Health) 3 x Per Week/30 Days Discharge Instructions: Apply three layer compression as directed. Pad bend of ankle with foam or ABD pad. Wound #64 -  Foot Wound Laterality: Dorsal, Left Cleanser: Soap and Water 3 x Per Week/30 Days Discharge Instructions: May shower and wash wound with dial antibacterial soap and water prior to dressing change. Peri-Wound Care: Sween Lotion (Moisturizing lotion) (Home Health) 3 x Per Week/30 Days Discharge Instructions: Apply moisturizing lotion to dry skin on legs Prim Dressing: KerraCel Ag Gelling  Fiber Dressing, 4x5 in (silver alginate) 3 x Per Week/30 Days ary Discharge Instructions: Apply silver alginate to wound bed as instructed Secondary Dressing: ABD Pad, 8x10 (Home Health) 3 x Per Week/30 Days Discharge Instructions: Apply over primary dressing as directed. Secondary Dressing: Zetuvit Plus 4x4 in (Home Health) 3 x Per Week/30 Days Discharge Instructions: or equivalent extra absorbent pad.Apply over primary dressing as needed. Compression Wrap: ThreePress (3 layer compression wrap) (Home Health) 3 x Per Week/30 Days Discharge Instructions: Apply three layer compression as directed. Pad bend of ankle with foam or ABD pad. Wound #67 - Lower Leg Wound Laterality: Right, Medial Cleanser: Soap and Water 3 x Per Week/30 Days Discharge Instructions: May shower and wash wound with dial antibacterial soap and water prior to dressing change. Peri-Wound Care: Sween Lotion (Moisturizing lotion) (Home Health) 3 x Per Week/30 Days Discharge Instructions: Apply moisturizing lotion to dry skin on legs Prim Dressing: KerraCel Ag Gelling Fiber Dressing, 4x5 in (silver alginate) 3 x Per Week/30 Days ary Discharge Instructions: Apply silver alginate to wound bed as instructed Secondary Dressing: ABD Pad, 8x10 (Home Health) 3 x Per Week/30 Days Discharge Instructions: Apply over primary dressing as directed. Secondary Dressing: Zetuvit Plus 4x4 in (Home Health) 3 x Per Week/30 Days Discharge Instructions: or equivalent extra absorbent pad.Apply over primary dressing as needed. Compression Wrap: ThreePress (3 layer compression wrap) (Home Health) 3 x Per Week/30 Days Discharge Instructions: Apply three layer compression as directed. Pad bend of ankle with foam or ABD pad. Wound #72 - Foot Wound Laterality: Dorsal, Right Cleanser: Soap and Water 3 x Per Week/30 Days Discharge Instructions: May shower and wash wound with dial antibacterial soap and water prior to dressing change. Peri-Wound Care:  Sween Lotion (Moisturizing lotion) (Home Health) 3 x Per Week/30 Days Discharge Instructions: Apply moisturizing lotion to dry skin on legs Prim Dressing: KerraCel Ag Gelling Fiber Dressing, 4x5 in (silver alginate) 3 x Per Week/30 Days ary Discharge Instructions: Apply silver alginate to wound bed as instructed Secondary Dressing: ABD Pad, 8x10 (Home Health) 3 x Per Week/30 Days Discharge Instructions: Apply over primary dressing as directed. Secondary Dressing: Zetuvit Plus 4x4 in (Home Health) 3 x Per Week/30 Days Discharge Instructions: or equivalent extra absorbent pad.Apply over primary dressing as needed. Compression Wrap: ThreePress (3 layer compression wrap) (Home Health) 3 x Per Week/30 Days Discharge Instructions: Apply three layer compression as directed. Pad bend of ankle with foam or ABD pad. Electronic Signature(s) Signed: 04/03/2021 4:03:51 PM By: [name] PA-C Signed: 04/04/2021 5:50:03 PM By: [name] RN, BSN Entered By: [name] on 04/03/2021 11:37:19 -------------------------------------------------------------------------------- Problem List Details Patient Name: Date of Service: [name]. 04/03/2021 10:15 A M Medical Record Number: [id number] Patient Account Number: [id number] Date of Birth/Sex: Treating RN: 1951/02/13 (70 y.o. [name] Primary Care Provider: [name] Other Clinician: Referring Provider: Treating Provider/Extender: Melchor Amour Weeks in Treatment: [phone] Active Problems ICD-10 Encounter Code Description Active Date MDM Diagnosis E11.622 Type 2 diabetes mellitus with other skin ulcer 08/05/2017 No Yes I89.0 Lymphedema, not elsewhere classified 08/05/2017 No Yes I87.331 Chronic venous hypertension (idiopathic) with ulcer and  inflammation of right 08/05/2017 No Yes lower extremity I87.332 Chronic venous hypertension (idiopathic) with ulcer and inflammation of left 08/05/2017 No Yes lower  extremity L97.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 leg with fat layer 08/05/2017 No Yes exposed L97.822 Non-pressure chronic ulcer of other part of left lower leg with fat layer exposed2/20/2019 No Yes L97.522 Non-pressure chronic ulcer of other part of left foot with fat layer exposed 06/01/2019 No Yes I10 Essential (primary) hypertension 08/05/2017 No Yes E66.01 Morbid (severe) obesity due to excess calories 08/05/2017 No Yes F41.8 Other specified anxiety disorders 08/05/2017 No Yes R53.1 Weakness 08/05/2017 No Yes Inactive Problems Resolved Problems Electronic Signature(s) Signed: 04/03/2021 10:28:49 AM By: [name] PA-C Entered By: [name] on 04/03/2021 10:28:49 -------------------------------------------------------------------------------- Progress Note Details Patient Name: Date of Service: [name]. 04/03/2021 10:15 A M Medical Record Number: [id number] Patient Account Number: [id number] Date of Birth/Sex: Treating RN: [birth year] (70 y.o. [name] Primary Care Provider: [name] Other Clinician: Referring Provider: Treating Provider/Extender: Melchor Amour Weeks in Treatment: [phone] Subjective Chief Complaint Information obtained from Patient Bilateral reoccurring LE ulcers History of Present Illness (HPI) this patient has been seen a couple of times before and returns with recurrent problems to her right and left lower extremity with swelling and weeping ulcerations due to not wearing her compression stockings which she had been advised to do during her last discharge, at the end of June 2018. During her last visit the patient had had normal arterial blood flow and her venous reflux study did not necessitate any surgical intervention. She was recommended compression and elevation and wound care. After prolonged treatment the patient was completely healed but she has been noncompliant with wearing or  compressions.. She was here last week with an outpatient return visit planned but the patient came in a very poor general condition with altered mental status and was rushed to the ER on my request. With a history of hypertension, diabetes, TIA and right-sided weakness she was set up for an MRI on her brain and cervical spine and was sent to [facility]. Getting an MRI done was very difficult but once the workup was done she was found not to have any spinal stenosis, epidural abscess or hematoma or discitis. This was radiculopathy to be treated as an outpatient and she was given a follow-up appointment. Today she is feeling much better alert and oriented and has come to reevaluate her bilateral lower extremity lymphedema and ulceration 03/25/2017 -- she was admitted to the hospital on 03/16/2017 and discharged on 03/18/2017 with left leg cellulitis and ulceration. She was started on vancomycin and Zosyn and x-ray showed no bony involvement. She was treated for a cellulitis with IV antibiotics changed to Rocephin and Flagyl and was discharged on oral Keflex and doxycycline to complete a 7 day course. Last hemoglobin A1c was 7.1 and her other ailments including hypertension got asthma were appropriately treated. 05/06/2017 -- she is awaiting the right size of compression stockings from [place] but other than that has been doing well. ====== Old notes 70 year old patient was seen one time last October and was lost to follow-up. She has recurrent problems with weeping and ulceration of her left lower extremity and has swelling of this for several years. It has been worse for the last 2 months. Past medical history is significant for diabetes mellitus type 2, hypertension, gout, morbid obesity, depressive disorders, hiatal hernia, migraines, status post knee surgery, risk of  a cholecystectomy, vaginal hysterectomy and breast biopsy. She is not a smoker. As noted before she has never had a venous duplex  study and an arterial ABI study was attempted but the left lower extremity was noncompressible 10/01/2016 -- had a lower extremity venous duplex reflux evaluation which showed no evidence of deep vein reflux in the right or left lower extremity, and no evidence of great saphenous vein reflux more than 500 ms in the right or left lower extremity, and the left small saphenous vein is incompetent but no vascular consult was recommended. review of her electronic medical records noted that the ABI was checked in July 2017 where the right ABI was normal limits and the left ABI could not be ascertained due to pain with cuff pressure but the waveforms are within normal limits. her arterial duplex study scheduled for April 27. 10/08/2016 -- the patient has various reasons for not having a compression on and for the last 3 days she has had no compression on her left lower extremity either due to pain or the lack of nursing help. She does not use her juxta lites either. 10/15/2016 -- the patient did not keep her appointment for arterial duplex study on April 27 and I have asked her to reschedule this. Her pain is out of proportion with the physical findings and she continuously fails to wear a compression wraps and cuts them off because she says she cannot tolerate the pain. She does not use her juxta lites either. 10/22/2016 -- he has rescheduled her arterial duplex study to May 21 and her pain today is a bit better. She has not been wearing her juxta lites on her right lower extremity but now understands that she needs to do this. She did tolerate the to press compression wrap on her left lower extremity 10/29/2016 --arterial duplex study is scheduled for next week and overall she has been tolerating her compression wraps and also using her juxta lites on her right lower extremity 11/05/2016 -- the right ABI was 0.95 the left was 1.03. The digit TBI is on the right was 0.83 on the left was 0.92 and she had  biphasic flow through these vessels. The impression was that of normal lower extremity arterial study. 11/12/2016 -- her pain is minimal and she is doing very well overall. 11/26/2016 -- she has got juxta lites and her insurance will not pay for additional dual layer compression stockings. She is going to order some from [place]. 05/12/2017 -- her juxta lites are very old and too big for her and these have not been helping with compression. She did get 20-30 mm compression stockings from [place] but she and her husband are unable to put these on. I believe she will benefit from bilateral Extremit-ease, compression stockings and we will measure her for these today. 05/20/2017 -- lymphedema on the left lower extremity has increased a lot and she has a open ulceration as a result of this. The right lower extremity is looking pretty good. She has decided to by the compression stockings herself and will get reimbursed by the home health, at a later date. 05/27/2017 -- her sciatica is bothering her a lot and she thought her left leg pain was caused due to the compression wrap and hence removed it and has significant lymphedema. There is no inflammation on this left lower extremity. 06/17/17 on evaluation today patient appears to be doing very well and in fact is completely healed in regard to her ulcerations. Unfortunately however  she does have continued issues with lymphedema nonetheless. We did order compression garments for her unfortunately she states that the size that she received were large although we ordered medium. Obviously this means she is not getting the optimal compression. She does not have those with her today and therefore we could not confirm and contact the company on her behalf. Nonetheless she does state that she is going to have her husband bring them by tomorrow so that we can verify and then get in touch with the company. No fevers, chills, nausea, or vomiting noted at this time.  Overall patient is doing better otherwise and I'm pleased with the progress she has made. 07/01/17 on evaluation today patient appears to be doing very well in regard to her bilateral lower extremity she does not have any openings at this point which is excellent news. Overall I'm pleased with how things have progressed up to this time. Since she is doing so well we did order her compression which we are seeing her today to ensure that it fits her properly and everything is doing well in that regard and then subsequently she will be discharged. ============ Old Notes: 03/31/16 patient presents today for evaluation concerning open wounds that she has over the left medial ankle region as well as the left dorsal foot. She has previously had this occur although it has been healed for a number of months after having this for about a year prior until her hospitalization on 01/05/16. At that point in time it appears that she was admitted to the hospital for left lower extremity cellulitis and was placed on vancomycin and Zosyn at that point. Eventually upon discharge on January 15, 2016 she was placed on doxycycline at that point in time. Later on 03/27/16 positive wound culture growing Escherichia coli this was switched to amoxicillin. Currently she tells me that she is having pain radiated to be a 7 out of 10 which can be as high as 10 out of 10 with palpation and manipulation of the wound. This wound appears to be mainly venous in nature due to the bilateral lower extremity venous stasis/lymphedema. This is definitely much worse on her left than the right side. She does have type 1 diabetes mellitus, hypertension, morbid obesity, and is wheelchair dependent.during the course of the hospital stay a blood culture was also obtained and fortunately appeared negative. She also had an x-ray of the tibia/fibula on the left which showed no acute bone abnormality. Her white blood cell count which was performed last on  03/25/16 was 7.3, hemoglobin 12.8, protein 7.1, albumin 3.0. Her urine culture appeared to be negative for any specific organisms. Patient did have a left lower extremity venous duplex evaluation for DVT . This did not include venous reflux studies but fortunately was negative for DVT Patient also had arterial studies performed which revealed that she had a . normal ABI on the right though this was unable to be performed on the left secondary to pain that she was having around the ankle region due to the wound. However it was stated on report that she had biphasic pulses and apparently good blood flow. ========== 06/03/17 she is here in follow-up evaluation for right lower extremity ulcer. The right lower sure he has healed but she has reopened to the left medial malleolus and dorsal foot with weeping. She is waiting for new compression garments to arrive from home health, the previous compression garments were ill fitting. We will continue with compression bilaterally  and follow-up in 2 weeks Readmission: 08/05/17 on evaluation today patient appears to be doing somewhat poorly in regard to her left lower extremity especially although the right lower extremity has a small area which may no longer be open. She has been having a lot of drainage from the left lower extremity however he tells me that she has not been able to use the EXTREMIT-EASE Compression at this point. She states that she did better and was able to actually apply the Juxta-Lite compression although the wound that she has is too large and therefore really does not compress which is why she cannot wear it at this point. She has no one who can help her put it on regular basis her son can sometimes but he's not able to do it most of the time. I do believe that's why she has begun to weave and have issues as she is currently yet again. No fevers, chills, nausea, or vomiting noted at this time. Patient is no evidence of dementia. 08/12/17  on evaluation today patient appears to still be doing fairly well in regard to the draining areas/weeping areas at this point. With that being said she unfortunately did go to the ER yesterday due to what was felt to be possibly a cellulitis. They place her on doxycycline by mouth and discharge her home. She definitely was not admitted. With that being said she states she has had more discomfort which has been unusual for her even compared to prior times and she's had infections.08/12/17 on evaluation today patient appears to still be doing fairly well in regard to the draining areas/weeping areas at this point. With that being said she unfortunately did go to the ER yesterday due to what was felt to be possibly a cellulitis. They place her on doxycycline by mouth and discharge her home. She definitely was not admitted. With that being said she states she has had more discomfort which has been unusual for her even compared to prior times and she's had infections. 08/19/17 put evaluation today patient tells me that she's been having a lot of what sounds to be neuropathic type pain in regard to her left lower extremity. She has been using over-the-counter topical bins again which some believe. That in order to apply the she actually remove the wrap we put on her last Wednesday on Thursday. Subsequently she has not had anything on compression wise since that time. The good news is a lot of the weeping areas appear to have closed at this point again I believe she would do better with compression but we are struggling to get her to actually use what she needs to at this point. No fevers, chills, nausea, or vomiting noted at this time. 09/03/17 on evaluation today patient appears to be doing okay in regard to her lower extremities in regard to the lymphedema and weeping. Fortunately she does not seem to show any signs of infection at this point she does have a little bit of weeping occurring in the right medial  malleolus area. With that being said this does not appear to be too significant which is good news. 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 She has severe skin damage secondary to both of these features involving the dorsal left foot and medial left ankle and lower leg. Still has open areas in the left anterior foot. The area on the right closed over. She uses her own juxta light stockings. She  does not have an arterial issue 09/16/17 on evaluation today patient actually appears to be doing excellent in regard to her bilateral lower extremity swelling. The Juxta-Lite compression wrap seem to be doing very well for her. She has not however been using the portion that goes over her foot. Her left foot still is draining a little bit not nearly as significant as it has been in the past but still I do believe that she likely needs to utilize the full wrap including the foot portion of this will improve as well. She also has been apparently putting on a significant amount of Vaseline which also think is not helpful for her. I recommended that if she feels she needs something for moisturizer Eucerin will probably be better. 09/30/17 on evaluation today patient presents with several new open areas in regard to her left lower extremity although these appear to be minimal and mainly seem to be more moisture breakdown than anything. Fortunately she does not seem to have any evidence of infection which is great news. She has been tolerating the dressing changes without complication we are using silver alginate on the foot she has been using AB pads to have the legs and using her Juxta- Lite compression which seems to be controlling her swelling very well. Overall I'm pleased with the poor way she has progressed. 10/14/17 on evaluation today patient appears to be doing better in regard to her left lower extremity areas of weeping. She does still  have some discomfort although in general this does not appear to be as macerated and I think it is progressing nicely. I do think she still needs to wear the foot portion of her Juxta- Lite in order to get the most benefit from the wrap obviously. She states she understands. Fortunately there does not appear to be evidence of infection at this time which is great news. 10/28/17 on evaluation today patient appears to be doing excellent in regard to her left lower extremity. She has just a couple areas that are still open and seem to be causing any trouble whatsoever. For that reason I think that she is definitely headed in the right direction the spots are very tiny compared to what we have been dealing with in the past. 11/11/17 on evaluation today patient appears to have a right lateral lower extremity ulcer that has opened since I last saw her. She states this is where the home health nurse that was coming out remove the dressing without wetting the alginate first. Nonetheless I do not know if this is indeed the case or not but more importantly we have not ordered home help to be coming out for her wounds at all. I'm unsure as to why they are coming out and we're gonna have to check on this and get things situated in that regard. With that being said we currently really do not need them to be coming out as the patient has been taking care of her leg herself without complication and no issues. In fact she was doing much better prior to nursing coming out. 11/25/17 on evaluation today patient actually appears to be doing fairly well in regard to her left lower extremity swelling. In fact she has very little area of weeping at this point there's just a small spot on the lateral portion of her right leg that still has me just a little bit more concerned as far as wanting to see this clear up before I discharge her to caring for  this at home. Nonetheless overall she has made excellent progress. 12/09/17 on  evaluation today patient appears to be doing rather well in regard to her lower extremity edema. She does have some weeping still in the left lower extremity although the big area we were taking care of two weeks ago actually has closed and she has another area of weeping on the left lower extremity immediately as well is the top of her foot. She does not currently have lymphedema pumps she has been wearing her compression daily on a regular basis as directed. With that being said I think she may benefit from lymphedema pumps. 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 We had a very difficult time getting and keeping this under control. 12/23/17 on evaluation today patient actually appears to be doing a little bit more poorly in regard to her bilateral lower extremities. She has been tolerating the Juxta-Lite compression wraps. Unfortunately she has two new ulcers on the right lower extremity and left lower Trinity ulceration seems to be larger. Obviously this is not good news. She has been tolerating the dressings without complication. 12/30/17 on evaluation today patient actually appears to be doing much better in regard to her bilateral lower extremity edema. She continues to have some issues with ulcerations and in fact there appears to be one spot on each leg where the wrap may have caused a little bit of a blister which is subsequently opened up at this point is given her pain. Fortunately it does not appear to be any evidence of infection which is good news. No fevers chills noted. 01/13/18 on evaluation today patient appears to be doing rather well in regard to her bilateral lower extremities. The dressings did get kind of stuck as far as the wound beds are concerned but again I think this is mainly due to the fact that she actually seems to be  showing signs of healing which is good news. She's not having as much drainage therefore she was having more of the dressing sticking. Nonetheless overall I feel like her swelling is dramatically down compared to previous. 01/20/18 on evaluation today patient unfortunately though she's doing better in most regards has a large blister on the left anterior lower extremity where she is draining quite significantly. Subsequently this is going to need debridement today in order to see what's underneath and ensure she does not continue to trapping fluid at this location. Nonetheless No fevers, chills, nausea, or vomiting noted at this time. 01/27/18 on evaluation today patient appears to be doing rather well at this point in regard to her right lower extremity there's just a very small area that she still has open at this point. With that being said I do believe that she is tolerating the compression wraps very well in making good progress. Home health is coming out at this point to see her. Her left lower extremity on the lateral portion is actually what still mainly open and causing her some discomfort for the most part 02/10/18 on evaluation today patient actually appears to be doing very well in regard to her right lower extremity were all the ulcers appear to be completely close. In regard to the left lower extremity she does have two areas still open and some leaking from the dorsal surface of her foot but this still seems to  be doing much better to me in general. 02/24/18 on evaluation today patient actually appears to be doing much better in regard to her right lower extremity this is still completely healed. Her left lower extremity is also doing much better fortunately she has no evidence of infection. The one area that is gonna require some debridement is still on the left anterior shin. Fortunately this is not hurting her as badly today. 03/10/18 on evaluation today patient appears to be doing better  in some regards although she has a little bit more open area on the dorsal foot and she also has some issues on the medial portion of the left lower extremity which is actually new and somewhat deep. With that being said there fortunately does not appear to be any significant signs of infection which is good news. No fevers, chills, nausea, or vomiting noted at this time. In general her swelling seems to be doing fairly well which is good news. 03/31/18 on evaluation today patient presents for follow-up concerning her left lower extremity lymphedema. Unfortunately she has been doing a little bit more poorly since I last saw her in regard to the amount of weeping that she is experiencing. She's also having some increased pain in the anterior shin location. Unfortunately I do not feel like the patient is making such good progress at this point a few weeks back she was definitely doing much better. 04/07/18 on evaluation today patient actually appears to be showing some signs of improvement as far as the left lower extremity is concerned. She has been tolerating the dressing changes and it does appear that the Drawtex did better for her. With that being said unfortunately home health is stating that they cannot obtain the Drawtex going forward. Nonetheless we're gonna have to check and see what they may be able to get the alginate they were using was getting stuck in causing new areas of skin being pulled all that with and subsequently weep and calls her to worsen overall this is the first time we've seen improvement at this time. 04/14/18 on evaluation today patient actually appears to be doing rather well at this point there does not appear to be any evidence of infection at this time and she is actually doing excellent in regard to the weeping in fact she almost has no openings remaining even compared to just last week this is a dramatic improvement. No fevers chills noted 04/21/18 evaluation today  patient actually appears to be doing very well. She in fact is has a small area on the posterior lower extremity location and she has a small area on the dorsal surface of her foot that are still open both of which are very close to closing. We're hoping this will be close shortly. She brought her Juxta-Lite wrap with her today hoping that would be able to put her in it unfortunately I don't think were quite at that point yet but we're getting closer. 04/28/18 upon evaluation today patient actually appears to be doing excellent in regard to her left lower extremity ulcer. In fact the region on the posterior lower extremity actually is much smaller than previously noted. Overall I'm very happy with the progress she has made. She again did bring her Juxta-Lite although we're not quite ready for that yet. 05/11/18 upon evaluation today patient actually appears to be doing in general fairly well in regard to her left lower Trinity. The swelling is very well controlled. With that being said she has a  new area on the left anterior lower extremity as well as between the first and second toes of her left foot that was not present during the last evaluation. The region of her posterior left lower extremity actually appears to be almost completely healed. T be honest I'm very pleased with o the way that stands. Nonetheless I do believe that the lotion may be keeping the area to moist as far as her legs are concerned subsequently I'm gonna consider discontinuing that today. 05/26/18 on evaluation today patient appears to be doing rather well in regard to her left lower should be ulcers. In fact everything appears to be close except for a very small area on the left posterior lower extremity. Fortunately there does not appear to be any evidence of infection at this time. Overall very pleased with her progress. 06/02/18 and evaluation today patient actually appears to be doing very well in regard to her lower  extremity ulcers. She has one small area that still continues to weep that I think may benefit her being able to justify lotion and user Juxta-Lite wraps versus continued to wrap her. Nonetheless I think this is something we can definitely look into at this point. 06/23/18 on evaluation today patient unfortunately has openings of her bilateral lower extremities. In general she seems to be doing much worse than when I last saw her just as far as her overall health standpoint is concerned. She states that her discomfort is mainly due to neuropathy she's not having any other issues otherwise. No fevers, chills, nausea, or vomiting noted at this time. 06/30/18 on evaluation today patient actually appears to be doing a little worse in regard to her right lower extremity her left lower extremity of doing fairly well. Fortunately there is no sign of infection at this time. She has been tolerating the dressing changes without complication. Home health did not come out like they were supposed to for the appropriate wrap changes. They stated that they never received the orders from [place] which were fax. Nonetheless we will send a copy of the orders with the patient today as well. 07/07/18 on evaluation today patient appears to be doing much better in regard to lower extremities. She still has several openings bilaterally although since I last saw her her legs did show obvious signs of infection when she later saw her nurse. Subsequently a culture was obtained and she is been placed on Bactrim and Keflex. Fortunately things seem to be looking much better it does appear she likely had an infection. Again last week we'd even discussed it but again there really was not any obvious sign that she had infection therefore we held off on the antibiotics. Nonetheless I'm glad she's doing better today. 07/14/18 on evaluation today patient appears to be doing much better regarding her bilateral lower Trinity's. In fact on the right  lower for me there's nothing open at this point there are some dry skin areas at the sites where she had infection. Fortunately there is no evidence of systemic infection which is excellent news. No fevers chills noted 07/21/18 on evaluation today patient actually appears to be doing much better in regard to her left lower extremity ulcers. She is making good progress and overall I feel like she's improving each time I see her. She's having no pain I do feel like the infection is completely resolved which is excellent news. No fevers, chills, nausea, or vomiting noted at this time. 07/28/18 on evaluation today patient appears to  be doing very well in regard to her left lower [employer]. Everything seems to be showing signs of improvement which is excellent news. Overall very pleased with the progress that has been made. Fortunately there's no evidence of active infection at this time also excellent news. 08/04/18 on evaluation today patient appears to be doing more poorly in regard to her bilateral lower extremities. She has two new areas open up on the right and these were completely closed as of last week. She still has the two spots on the left which in my pinion seem to be doing better. Fortunately there's no evidence of infection again at this point. 08/11/18 on evaluation today patient actually appears to be doing very well in regard to her bilateral lower Trinity wounds that all seem to be doing better and are measures smaller today. Fortunately there's no signs of infection. No fevers, chills, nausea, or vomiting noted at this time. 08/18/18 on evaluation today patient actually appears to be doing about the same inverter bilateral lower extremities. She continues to have areas that blistering open as was drain that fortunately nothing too significant. Overall I feel like Drawtex may have done better for her however compared to the collagen. 08/25/18 on evaluation today patient appears to be  doing a little bit more poorly today even compared to last time I saw her. Again I'm not exactly sure why she's making worse progress over the past several weeks. I'm beginning to wonder if there is some kind of underlying low level infection causing this issue. I did actually take a culture from the left anterior lower extremity but it was a new wound draining quite a bit at this point. Unfortunately she also seems to be having more pain which is what also makes me worried about the possibility of infection. This is despite never erythema noted at this point. 09/01/18 on evaluation today patient actually appears to be doing a little worse even compared to last week in regard to bilateral lower extremities. She did go to the hospital on the 16th was given a dose of IV Zosyn and then discharged with a recommendation to continue with the Bactrim that I previously prescribed for her. Nonetheless she is still having a lot of discomfort she tells me as well at this time. This is definitely unfortunate. No fevers, chills, nausea, or vomiting noted at this time. 09/08/18 on evaluation today patient's bilateral lower extremities actually appear to be shown signs of improvement which is good news. Fortunately there does not appear to be any signs of active infection I think the anabiotic is helping in this regard. Overall I'm very pleased with how she is progressing. 09/15/18 patient was actually seen in ER yesterday due to her legs as well unfortunately. She states she's been having a lot of pain and discomfort as well as a lot of drainage. Upon inspection today the patient does have a lot of swelling and drainage I feel like this is more related to lymphedema and poor fluid control than it is to infection based on what I'm seeing. The physician in the emergency department also doubted that the patient was having a significant infection nonetheless I see no evidence of infection obvious at this point although I do  see evidence of poor fluid control. She still not using a compression pumps, she is not elevating due to her lift chair as well as her hospital bed being broken, and she really is not keeping her legs up as much as  they should be and also has been taking off her wraps. All this combined I think has led to poor fluid control and to be honest she may be somewhat volume overloaded in general as well. I recommend that she may need to contact your physician to see if a prescription for a diuretic would be beneficial in their opinion. As long as this is safe I think it would likely help her. 09/29/18 on evaluation today patient's left lower extremity actually appears to be doing quite a bit better. At least compared to last time that I saw her. She still has a large area where she is draining from but there's a lot of new skin speckled trout and in fact there's more new skin that there are open areas of weeping and drainage at this point. This is good news. With regard to the right lower extremity this is doing much better with the only open area that I really see being a dry spot on the right lateral ankle currently. Fortunately there's no signs of active infection at this time which is good news. No fevers, chills, nausea, or vomiting noted at this time. The patient seems somewhat stressed and overwhelmed during the visit today she was very lethargic as such. She does and she is not taking any pain medications at this point. Apparently according to her husband are also in the process of moving which is probably taking its toll on her as well. 10/06/18 on evaluation today patient appears to be doing rather well in regard to her lower extremities compared to last evaluation. Fortunately there's no signs of active infection. She tells me she did have an appointment with her primary. Nonetheless he was concerned that the wounds were somewhat deep based on pictures but we never actually saw her legs. She states  that he had her somewhat worried due to the fact that she was fearing now that she was [place] have to have an amputation. With that being said based on what I'm seeing check she looks better this week that she has the last two times I've seen her with much less drainage I'm actually pleased in this regard. That doesn't mean that she's out of the water but again I do not think what the point of talking about education at all in regard to her leg. She is very happy to hear this. She is also not having as much pain as she was having last week. 10/13/18 unfortunately on evaluation today patient still continues to have a significant amount of drainage she's not letting home health actually apply the compression dressings at this point. She's trying to use of Juxta-Lite of the top of Kerlex and the second layer of the three layer compression wrap. With that being said she just does not seem to be making as good a progress as I would expect if she was having the compression applied and in place on a regular basis. No fevers, chills, nausea, or vomiting noted at this time. 10/20/18 on evaluation today patient appears to be doing a little better in regard to her bilateral lower extremity ulcers. In fact the right lower extremity seems to be healed she doesn't even have any openings at this point left lower extremity though still somewhat macerated seems to be showing signs of new skin growth at multiple locations throughout. Fortunately there's no evidence of active infection at this time. No fevers, chills, nausea, or vomiting noted at this time. 10/27/18 on evaluation today patient appears to  be doing much better in regard to her left lower Trinity ulcer. She's been tolerating the laptop complication and has minimal drainage noted at this point. Fortunately there's no signs of active infection at this time. No fevers, chills, nausea, or vomiting noted at this time. 11/03/18 on evaluation today patient actually  appears to be doing excellent in regard to her left lower extremity. She is having very little drainage at this point there does not appear to be any significant signs of infection overall very pleased with how things have gone. She is likewise extremely pleased still and seems to be making wonderful progress week to week. I do believe antibiotics were helpful for her. Her primary care provider did place on amateur clean since I last saw her. 11/17/18 on evaluation today patient appears to be doing worse in regard to her bilateral lower extremities at this point. She is been tolerating the dressing changes without complication. With that being said she typically takes the [place] off fairly quickly upon arriving home even after being seen here in the clinic and does not allow home health reapply command as part of the dressing at home. Therefore she said no compression essentially since I last saw her as best I can tell. With that being said I think it shows and how much swelling she has in the open wounds that are noted at this point. Fortunately there's no signs of infection but unfortunately if she doesn't get this under control I think she will end up with infection and more significant issues. 11/24/18 on evaluation today patient actually appears to be doing somewhat better in regard to her bilateral lower extremities. 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 Nonetheless that is doing better currently so she should be able to attempt reinitiating the lymphedema pumps at this time. No fevers, chills, nausea, or vomiting noted at this time. 12/01/18 upon evaluation today patient's left lower extremity appears to be doing quite well unfortunately her right lower extremity is not doing nearly as well. She has been tolerating the dressing changes without  complication unfortunately she did not keep a wrap on the right at this time. Nonetheless I believe this has led to increased swelling and weeping in the world is actually much larger than during the last evaluation with her. 12/08/18 on evaluation today patient appears to be doing about the same at this point in regard to her right lower extremity. There is some more palatable to touch I'm concerned about the possibility of there being some infection although I think the main issue is she's not keeping her compression wrap on which in turn is not allowing this area to heal appropriately. 12/22/18 on evaluation today patient appears to be doing better in regard to left lower extremity unfortunately significantly worse in regard to the right lower extremity. The areas of blistering and necrotic superficial tissue have spread and again this does not really appear to be signs of infection and all she just doesn't seem to be doing nearly as well is what she has been in the past. Overall I feel like the Augmentin did absolutely nothing for her she doesn't seem to have any infection again I really didn't think so last time either is more of a potential preventative measure and hoping that this would make some difference  but I think the main issue is she's not wearing her compression. She tells me she cannot wear the [place] been we put on she takes it off pretty much upon getting home. Subsequently she worshiped Juxta-Lite when I questioned her about how often she wears it this is no more than three hours a day obviously that leaves 21 hours that she has no compression and this is obviously not doing well for her. Overall I'm concerned that if things continue to worsen she is at great risk of both infection as well as losing her leg. 01/05/19 on evaluation today patient appears to be doing well in regard to her left lower extremity which he is allowing [place] to wrap and not so well with regard to her right lower  extremity which she is not allowing [place] to really wrap and keep the wrap on. She states that it hurts too badly whenever it's wrapped and she ends up having to take it off. She's been using the Juxta-Lite she tells me up to six hours a day although I question whether or not that's really been the case to be honest. Previously she told me three hours today nonetheless obviously the legs as long as the wrap is doing great when she is not is doing much more poorly. 01/12/2019 on evaluation today patient actually appears to be doing a little better in my opinion with regard to her right lower extremity ulcer. She has a small open area on the left lower extremity unfortunately but again this I think is part of the normal fluctuation of what she is going to have to expect with regard to her legs especially when she is not using her lymphedema pumps on a regular basis. Subsequently based on what I am seeing today I think that she does seem to be doing slightly better with regard to her right lower extremity she did see her primary care provider on Monday they felt she had an infection and placed her on 2 antibiotics. Both Cipro and clindamycin. Subsequently again she seems possibly to be doing a little bit better in regards to the right lower extremity she also tells me however she has been wearing the compression wrap over the past week since I spoke with her as well that is a Kerlix and Coban wrap on the right. No fevers, chills, nausea, vomiting, or diarrhea. 01/19/2019 on evaluation today patient appears to be doing better with regard to her bilateral lower extremities especially the right. I feel like the compression has been beneficial for her which is great news. She did get a call from her primary care provider on her way here today telling her that she did have methicillin- resistant Staphylococcus aureus and he was calling in a couple new antibiotics for her including a ointment to be applied she tells me  3 times a day. With that being said this sounds like likely to be Bactroban which I think could be applied with each dressing/wrap change but I would not be able to accommodate her applying this 3 times a day. She is in agreement with the least doing this we will add that to her orders today. 01/26/2019 on evaluation today patient actually appears to be doing much better with regard to her right lower extremity. Her left lower extremity is also doing quite well all things considering. Fortunately there is no evidence of active infection at this time. No fevers, chills, nausea, vomiting, or diarrhea. 02/02/2019 on evaluation today patient appears to  be doing much better compared to her last evaluation. Little by little off like her right leg is returning more towards normal. There does not appear to be any signs of active infection and overall she seems to be doing quite well which is great news. I am very pleased in this regard. No fevers, chills, nausea, vomiting, or diarrhea. 02/09/2019 upon evaluation today patient appears to be doing better with regard to her bilateral lower extremities. She has been tolerating the dressing changes without complication. Fortunately there is no signs of active infection at this time. No fevers, chills, nausea, vomiting, or diarrhea. 02/23/2019 on evaluation today patient actually appears to be doing quite well with regard to her bilateral lower extremities. She has been tolerating the dressing changes without complication. She is even used her pumps one time and states that she really felt like it felt good. With that being said she seems to be in good spirits and her legs appear to be doing excellent. 03/09/2019 on evaluation today patient appears to be doing well with regard to her right lower extremity there are no open wounds at this time she is having some discomfort but I feel like this is more neuropathy than anything. With regard to her left lower extremity she  had several areas scattered around that she does have some weeping and drainage from but again overall she does not appear to be having any significant issues and no evidence of infection at this time which is good news. 03/23/2019 on evaluation today patient appears to be doing well with regard to her right lower extremity which she tells me is still close she is using her juxta light here. Her left lower extremity she mainly just has an area on the foot which is still slightly draining although this also is doing great. Overall very pleased at this time. 04/06/2019 patient appears to be doing a little bit worse in regard to her left lower extremity upon evaluation today. She feels like this could be becoming infected again which she had issues with previous. Fortunately there is no signs of systemic infection but again this is always a struggle with her with her legs she will go from doing well to not so well in a very short amount of time. 04/20/2019 on evaluation today patient actually appears to be doing quite well with regard to her right lower extremity I do not see any signs of active infection at this time. Fortunately there is no fever chills noted. She is still taking the antibiotics which I prescribed for her at this point. In regard to the left lower extremity I do feel like some of these areas are better although again she still is having weeping from several locations at this time. 04/27/2019 on evaluation today patient appears to be doing about the same if not slightly worse in regard to her left lower extremity ulcers. She tells me when questioned that she has been sleeping in her Hoveround chair in fact she tells me she falls asleep without even knowing it. I think she is spending a whole lot of time in the chair and less time walking and moving around which is not good for her legs either. On top of that she is in a seated position which is also the worst position she is not really  elevating her legs and she is also not using her lymphedema pumps. All this is good to contribute to worsening of her condition in general. 05/18/2019 on evaluation today  patient appears to be doing well with regard to her lower extremity on the right in fact this is showing no signs of any open wounds at this time. On the left she is continuing to have issues with areas that do drain. Some of the regions have healed and there are couple areas that have reopened. She did go to the ER per the patient according to recommendations from the home health nurse due to what she was seen when she came out on 05/13/2019. Subsequently she felt like the patient needed to go to the hospital due to the fact that again she was having "milky white discharge" from her leg. Nonetheless she had and then was placed on doxycycline and subsequently seems to be doing better. 06/01/2019 upon evaluation today patient appears to be doing really in my opinion about the same. I do not see any signs of active infection which is good news. Overall she still has wounds over the bilateral lower extremities she has reopened on the right but this appears to be more of a crack where there is weeping/edema coming from the region. I do not see any evidence of infection at either site based on what I visualized today. 07/13/2019 upon evaluation today patient appears to be doing a little worse compared to last time I saw her. She since has been in the hospital from 06/21/2019 through 06/29/2019. This was secondary to having Covid. During that time they did apply lotion to her legs which unfortunately has caused her to develop a myriad of open wounds on her lower extremities. Her legs do appear to be doing better as far as the overall appearance is concerned but nonetheless she does have more open and weeping areas. 07/27/2019 upon evaluation today patient appears to be doing more poorly to be honest in regard to her left lower extremity in  particular. There is no signs of systemic infection although I do believe she may have local infection. She notes she has been having a lot of blue/green drainage which is consistent potentially with Pseudomonas. That may be something that we need to consider here as well. The doxycycline does not seem to have been helping. 08/03/2019 upon evaluation today patient appears to be doing a little better in my opinion compared to last week's evaluation. Her culture I did review today and she is on appropriate medications to help treat the Enterobacter that was noted. Overall I feel like that is good news. With that being said she is unfortunately continuing to have a lot of drainage and though it is doing better I still think she has a long ways to go to get things dried up in general. Fortunately there is no signs of systemic infection. 08/10/2019 upon evaluation today patient appears to be doing may be slightly better in regard to her left lower extremity the right lower extremity is doing much better. Fortunately there is no signs of infection right now which is good news. No fevers, chills, nausea, vomiting, or diarrhea. 08/24/2019 on evaluation today patient appears to be doing slightly better in regard to her lower extremities. The left lower extremity seems to be healed the right lower extremity is doing better though not completely healed as far as the openings are concerned. She has some generalized issues here with edema and weeping secondary to her lymphedema though again I do believe this is little bit drier compared to prior weeks evaluations. In general I am very pleased with how things seem to be progressing.  No fevers, chills, nausea, vomiting, or diarrhea. 08/31/2019 upon evaluation today patient actually seems to making some progress here with regard to the left lower extremity in particular. She has been tolerating the dressing changes without complication. Fortunately there is no signs of  active infection at this time. No fevers, chills, nausea, vomiting, or diarrhea. She did see Dr. Doren Custard and he did note that she did have a issue with the left great saphenous vein and the small saphenous vein in the leg. With that being said he was concerned about the possibility of laser ablation not being extremely successful. He also mentioned a small risk of DVT associated with the procedure. However if the wounds do not continue to improve he stated that that would probably be the way to go. Fortunately the patient's legs do seem to be doing much better. 09/07/2019 upon evaluation today patient appears to be doing better with regard to her lower extremities. She has been tolerating the dressing changes without complication. With that being said she is showing signs of improvement and overall very pleased. There are some areas on her leg that I think we do need to debride we discussed this last week the patient is in agreement with doing that as long as it does not hurt too badly. 09/14/2019 upon evaluation today patient appears to be doing decently well with regard to her left lower extremity. She is not having near as much weeping as she has had in the past things seem to be drying up which is good news. There is no signs of active infection at this time. 09/21/19 upon evaluation today patient appears to be doing better in regard overall to her bilateral lower extremities. She again has less open than she did previous and each week I feel like this is getting better. Fortunately there is no signs of active infection at this time. No fevers, chills, nausea, vomiting, or diarrhea. 09/28/2019 upon evaluation today patient actually appears to be showing signs of improvement with regard to her left lower extremity. Unfortunately the right medial lower extremity around the ankle region has reopened to some degree but this appears to be minimal still which is good news. There is no signs of active infection  at this time which is also good news. 10/12/2019 upon evaluation today patient appears to be doing okay with regard to her bilateral lower extremities today. The right is a little bit worse then last evaluation 2 weeks ago. The left is actually doing a little better in my opinion. Overall there is no signs of active infection at this time that I see. Obviously that something we have to keep a close eye on she is very prone to this with the significant and multiple openings that she has over the bilateral lower extremities. 10/19/2019 upon evaluation today patient appears to be doing about the best that I have seen her in quite some time. She has been tolerating the dressing changes without complication. There does not appear to be any signs of active infection and overall I am extremely happy with the way her legs appeared. She is drying up quite nicely and overall is having less pain. 11/09/2019 upon evaluation today patient appears to be doing better in regard to her wounds. She seems to be drying up more and more each time I see her this is just taking a very long time. Fortunately there is no signs of active infection at this time. 11/23/2019 upon evaluation today patient actually appears to be  doing excellent in regard to her lower extremities at this point compared to where she has been. Fortunately there is no signs of active infection at this time. She did go to the hospital last week for nausea and vomiting completely unrelated to her wounds. Fortunately she is doing better she was given some Reglan and got better. She had associated abdominal pain but they never found out what was going on. 12/07/2019 upon evaluation today patient appears to be doing well for the most part in regard to her legs. She unfortunately has not been keeping the Coban portion of her wraps on therefore the compression has not really been sufficient for what it is supposed to be. Nonetheless she tells me that it just hurt too  bad therefore she removed it. 12/21/2019 upon evaluation today patient actually appears to be doing quite well with regard to her legs. I do feel like she has been making progress which is great news and overall there is no signs of active infection at this time. No fevers, chills, nausea, vomiting, or diarrhea. 01/04/2020 upon evaluation today patient presents for follow-up concerning her lower extremity edema bilaterally. She still has open wounds she has not been using her lymphedema pumps. She is also not been utilizing her compression wraps appropriately she tends to unwrap them, take them off, or states that they hurt. Obviously the reason they hurt is because her legs start to swell but the issue is if she would use her compression/lymphedema pumps regularly she would not swell and she would have the pain. Nonetheless she has not even picked them up once honestly over the past several months and may be even as much as in the past year based on my opinion and what have seen. She tells me today that after last week when I talked about this with her specifically actually that was 2 weeks ago that she "forgot". 8//21 on evaluation today patient appears to be doing a little better in regard to her legs bilaterally. Fortunately there is no signs of active infection at this time. She tells me that she used her lymphedema pumps all of one time over the past 2 weeks since I last saw her. She tells me that she has been too busy in order to continue to use these. 02/01/2020 on evaluation today patient appears to be doing some better in regard to her wounds in general in her legs. We felt the right was healed although is not completely it does appear to be doing better she tells me she has been using her lymphedema pumps that she has had this six times since I last saw her. Obviously the more she does that the better she would do my opinion 02/15/2020 upon evaluation today patient appears to be doing about the  same in regard to her legs. She tells me that she is pumping I'm still not sure how much she does to be perfectly honest. However even if she does a little bit here and there I guess that is better than nothing. Fortunately there is no sign of active infection at this time which is great news. No fevers, chills, nausea, vomiting, or diarrhea. 02/29/2020 on evaluation today patient actually appears to be doing quite well all things considered this week. She has been tolerating the dressing changes without complication. Fortunately there is no signs of active infection at this time. No fevers, chills, nausea, vomiting, or diarrhea. 03/14/2020 upon evaluation today patient appears to be doing really about the  same in regard to her legs. There is no signs of improvement overall and she as noted from home health does not appear to be elevating her legs he can get into her lift chair. There is too much stuff piled up on it the patient tells me. She also tells me she cannot really use her pumps effectively due to the fact that she cannot have any space to get them on. Finally she is also not really elevating her legs because she is not sleeping in her bed she is sleeping in her chair currently and again overall I think everything that she is done in combination has been exactly the wrong thing for what she needs for her legs. 04/04/2020 upon evaluation today patient appears to be doing well at this time with regard to her legs. She is actually been pumping, keeping her wraps on, and to be honest she seems to be doing dramatically better the right leg is excellent the left leg is also excellent and measuring much smaller than previous. 04/18/2020 upon evaluation today patient actually is continue to make good progress in regard to her lower extremities bilaterally. Everything is improving and less wet that has been in the past overall I am extremely pleased with where things stand and I think that she is making  great progress. The patient tells me she still continue to use her compression pumps 05/02/2020 on evaluation today patient appears to be doing well at this time in regard to her left leg which is showing signs of drying up. With that being said she does have a lot of lymphedema type crusty skin around the toes of her left foot and the right medial ankle which has opened at this point. Fortunately there is no signs of active infection systemically at this point or even locally for that matter. 05/23/2020 on evaluation today patient appears to be doing well with regard to her lower extremities. Fortunately there is no signs of active infection at this time. No fever chills noted. She has been very depressed however she tells me. 06/06/2020 patient came in today for evaluation in regard to her bilateral lower extremity ulcerations. With that being said she came in feeling okay and actually laughing and joking around with the staff checking her in. Subsequently however she had a coughing spell and following the coughing spell it was a dramatic conversion from being jovial and joking around to being extremely short of breath her vital signs actually dropped in regard to her blood pressure from around 175 to down around 970 for systolic and from around 96 diastolic down to around 70. With that being said she also accompanied this with an increase in her respiratory rate which was also quite significant. Nonetheless I actually upon going into see her was extremely worried we called EMS to have her transported to the ER for further evaluation and treatment. She continued until EMS got here to be extremely short of breath even on 6 L of oxygen. Her oxygen saturation did come up to 99% but overall it was only 95 before which was not terrible she did feel like the oxygen helped her feel somewhat better however. She has a history of asthma but again even the coughing spell really should not have done this degree  of alteration in her demeanor from where she was just before to after the coughing spell. She tells [place] however she has been feeling somewhat abnormal since Friday although she could not really pinpoint exactly what  was going on. 06/27/2020 plan evaluation today patient appears to be doing okay in regard to her leg ulcers. She is really not showing a lot of improvement to be honest and she still has a lot of weeping. With that being said after I last saw her we called EMS to take her to the hospital she actually did not go she went to see her primary care provider who according to the patient have not identified and read through the note states that everything checked out okay. Nonetheless the patient has continued to have bouts where she gets very short of breath for seemingly no reason whatsoever. That happened even the same night that I saw her last time. This was after she refused transport to the hospital. With that being said she also tells me that just getting dressed to come here is quite a chore which is putting on her shirt causing her to have to stop to catch her breath. Obviously this is not normal and I feel like there is something going on. She may need a referral to be seen by cardiology ASAP. She does see her primary care provider early next week she tells me 07/11/2020 upon evaluation today patient's wounds again appear to be doing about the same she mainly has weeping of the bilateral lower extremities both are very swollen today she tells me she has been using her pumps but last time I saw her she told me she did not even have her pumps out that she had "packed them away because they had too much stuff in their house. Apparently her husband has been bringing stuff in from storage units. She then subsequently told me that she had so much stuff in her house that she has been staying up all night and did not go to bed till 7:00 this morning because she was cleaning up stuff. Nonetheless I  am still unsure as to whether or not she is using the pumps based on what I am seeing I would think probably not. Nonetheless she has been keeping the compression wraps in place that is at least something 07/25/2020 upon evaluation today patient appears to be doing well currently in regard to her legs all things considered. I do not think she is doing any worse significantly although honestly I do not think she is doing a lot better either. There does not appear to be any signs of infection which is good news although she does have a lot of weeping. She is using her pumps she tells me sporadically though she says that her husband is not a big fan of helping her to get the [place]. She also tells me is difficult because in their house. It would be easiest for her to use these as where her son is actually staying with her and living out at this point. Obviously I understand that she is having a lot of issues here and to be honest I do not really know what to say in that regard and the fact that I really cannot change the situations but I do believe she really needs to be using the lymphedema pumps to try to keep things under control here. Outside of that I think she is going to continue to have significant issues. I think the pumps coupled with good compression and elevation are the only things that are to help her at this point. 08/08/2020 upon evaluation today patient appears to be doing a little worse in regard to her legs in  general. I think this may be due to some infection currently. With that being said I am going to go ahead and likely see about putting her on an antibiotic. Were also can obtain a culture today where she had some purulent drainage. 08/29/2020 upon evaluation today patient appears to be doing about the same in regard to her bilateral lower extremities. Unfortunately she is continuing to have significant issues here with edema and she has significant lymphedema. With that being said she  is really not doing anything that she is supposed to be doing as far as elevation, compression, using lymphedema pumps or anything really for that matter. She does not use her pumps, is taken the wraps off shortly after having them put on as far as home health is concerned at least by the next day she tells me, and overall is not really elevating her legs she is telling me she has a lot to do as far as cleaning up in her house. Nonetheless this triad of noncompliance is leading to worsening not improving in regard to her legs. I had a very strong conversation with her about this today again. 09/12/2020 on evaluation today patient appears to be doing poorly with regard to her lower extremities bilaterally. Fortunately there does not appear to be any signs of active infection at this point. That is systemically. Locally there is some signs of pus coming from an area on her left leg. She has been on antibiotics recently though she is done with those currently. Nonetheless she tells me she has been having increased pain with the right leg this is extremely swollen as well. Fortunately there does not appear to be significant infection of the right leg though I think her pain is really as result of the increased swelling she has been declining the wrap on the right leg and the wounds here are getting significantly worse week by week. This obviously is not will be want to see. I discussed with the patient however if she does not use the wraps, use her pumps, and elevate her legs that she is not to get better. She has been sleeping in her motorized Hoveround which does not even allow her to elevate her legs at all. She tells me that she is also constantly "cleaning up as her husband told her that he wants to move back to [place]." Unfortunately this has been the narrative for the past several months in fact that she tells me that she has been having to clean up mass that was moved in from storage units  into their home and that she does not really sleep and she never really elevates her legs. I explained to the patient today that if she is not can I do any of this that there is really nothing that I can do to help her. 09/26/20 upon evaluation today patient appears to be doing about the same in regard to her wounds. Fortunately there is no signs of active infection at this time. No fever chills noted. She has been tolerating the dressing changes without complication which is good news. With that being said I do think that she unfortunately is still having a lot of pain but I think this is due to the swelling that really is not controlled and as I discussed with her last time she needs to be using her lymphedema pumps there is still in the box she has not even attempted to use those whatsoever. I am also not even certain  she is taking her fluid pills as she states she may need to have "get a refill" she was seen with her husband at the appointment today. 10/24/2020 upon evaluation today patient appears to be doing decently well in regard to her legs all things considered. She has been tolerating the dressing changes without complication. Fortunately there is no signs of active infection at this time. No fevers, chills, nausea, vomiting, or diarrhea. With that being said the patient does appear to have some excessive thickened skin buildup currently that has been require some sharp debridement to clear this away. I am hopeful if we do this we will be able to get some of this area to dry out this otherwise trapping fluid underneath. Her son is present during the office visit today. She tells me she still been doing a lot as far as cleaning up and going through boxes she tells me she has been up for a long time already this morning. 5/25; patient presents for 2-week follow-up. She had 3 layer compression wrap placed with calcium alginate underneath. She reports tolerating the wraps well. She has home health  that changes the wrap 2-3 times a week. She denies signs of infection. She has almost finished her course of antibiotics. She uses her lymphedema pumps once weekly. 11/21/2020 upon evaluation today patient appears to be doing well with regard to her legs all things considered. Fortunately there is no signs of active infection at this time which is great news I do not see any evidence of infection and overall I think that she is definitely improved compared to where things did previous. Nonetheless I do think that she may benefit from removing some of the thicker skin on the legs. Again we have attempted this in the past to some degree but I think that we might be able to more effectively do this of a clear some way and then potentially use a urea cream with salicylic acid to try to help clear some of this away. The patient is in agreement with the plan. 12/05/2020 upon evaluation today patient appears to be doing well with regard to her wounds in general. I feel like that things are somewhat improved the skin looking a little better. With that being said she still has significant lymphedema. She is not been using her lymphedema pumps and not really been elevating her legs. She also did have some maggots noted on the left leg she tells me that the "gnats are terrible and get all over her foot." With that being said I am concerned about the fact that the patient still is quite swollen even with the compression wraps I really feel like she needs to be using her pumps but I cannot talk her into doing this. 12/19/2020 upon evaluation today patient appears to be doing about the same in regard to her legs. Fortunately I do not see any signs of infection unfortunately she really seems to be very nonchalant about the fact that she has not been using her lymphedema pumps. She tells me that "I just forget". With that being said this needs to be something that is a priority for her I cannot count the number of times  that have had this discussion with her and yet nothing seems to change. With that being said I think that is very unlikely she is ever really getting healed with regard to her wounds to be honest she does not seem to be motivated to do anything to try to help  at this point despite everything that I have tried to do and recommend for her. 01/02/2021 upon evaluation today patient actually appears to be doing excellent in regard to her wounds. She has been tolerating the dressing changes without complication. Fortunately there does not appear to be any signs of active infection at this time which is great news. No fevers, chills, nausea, vomiting, or diarrhea. 01/23/2021 upon evaluation today patient appears to be doing worse in general in regard to her wounds. This is due to the fact that she is not been using her compression pumps which again she knows when she does not it always turns out poorly for her. Unfortunately the issue here today is that she is continuing to have troubles with her son she seems to be very depressed and I am truly sorry for her and everything she is going through. With that being said I do not know what to do to make things better if she want elevate her legs and use her lymphedema pumps there is really not have anything I can do to correct or fix the situation for her to be honest. I discussed all this with her today. 02/20/2021 upon evaluation today the patient unfortunately has been in the hospital due to cellulitis. Again this is something that is been an ongoing issue based on what I am seeing I think that she unfortunately still is having trouble complying with anything that we recommend. She has not been sleeping in her bed she tells me that she continues sleeping really in her motorized wheelchair that sweats been the case for the past several visits that have seen her despite me counseling to the opposite that she should be trying to sleep in her bed where she could get  her legs off the ground and allow some of the swelling edema to come down. Subsequently also it appears that someone not sure his primary care otherwise has given orders for her to not be wrapped and subsequently to be washing her legs daily which she is not doing. This was prior to her going into the hospital. She also tells me she is not been using her lymphedema pumps even before the infection setting and she went to the hospital. She also tells me that she does not always take her fluid pills and again the compression wraps have not been in place. Pretty much she has not done anything that I recommended up to this point. Again I had this conversation with her multiple times and unfortunately just does not seem to be sinking in that she really needs to be taking care of herself better. She is extremely noncompliant. 03/13/2021 upon evaluation today patient appears to be doing poorly still in regard to her legs. She is still not using any of the compression wrapping at this time on a regular basis and that she is taken it off. I discussed with her that she needs to not be doing this that that is definitely something that is going to cause her long-term trouble and definitely not help her to heal. She is also not use her lymphedema pumps but 1 time since last time I saw her 2 weeks ago based on what I am hearing. Nonetheless my issue here is simply that compliance is a big factor I think in her not getting better and this continues to be a problem. I think that I am very close to having a discussion with her that we may be discharging her due to noncompliance  if she does not step things up. 03/20/2021 upon evaluation today patient appears to be doing poorly in regard to her lower extremities. She came in today which is the current layer on the wraps and no Coban. Apparently she had taken this off after we saw her last week on Wednesday. This was after I had a long discussion with her about compliance and  the fact that she needed to be compliant with what we were telling her. Subsequently she had her husband take this off apparently within the next day following when she was here. Both on the home health nurse came in she did not have the Coban on as she told the home health nurse that we did not have her use the Coban any longer I am not really sure the reason she gave. We did contact Olivia Mackie who is the home health nurse in order to discuss this with her today. She states that that is exactly what the patient said that we were no longer using the Coban. She unfortunately had not received the directions from the office as far as orders were concerned and so did not have confirmation of this. We refax that but again apparently she never got those. Not sure what is going on in that regard but nonetheless in the interim the patient has not been wearing her compression wraps. She also has not been utilizing her lymphedema pumps she tells me "they are sitting on the table". She also has fluid pills but she tells me that she has not been using those because she does not even know where the bottle is. She tells me that she is not "in the frame of mind" to be able to comply with the treatment recommendations. Either way I discussed with her today that we are going to be initiating discharge from the wound care center due to noncompliance. 04/03/2021 upon evaluation today patient's wounds actually appear to be doing about the same. Her son is with her today. She was accusing me today of not caring about the fact that she has pain. I explained to her that I never denied that she has pain in fact we discussed this many times and IV been in the past offered a referral to pain management though she always told me that she would talk to her primary care provider about this. Nonetheless I do not think that my stance is changed I still believe she has pain and no she has wounds and I know when her legs get really swollen  they do hurt but in the past she is always done better whenever we got things under control from the standpoint of swelling when she was using her lymphedema pumps regularly. Again I think that still the way to go she needs to be wearing a compression, using her compression pumps, and getting her legs elevated during the day as well as sleeping in bed at night. All this is of utmost importance. Again this was reiterated with her today her son was present during the evaluation. Objective Constitutional Well-nourished and well-hydrated in no acute distress. Vitals Time Taken: 10:44 AM, Height: 62 in, Weight: 335 lbs, BMI: 61.3, Temperature: 99.2 F, Pulse: 86 bpm, Respiratory Rate: 20 breaths/min, Blood Pressure: 150/67 mmHg, Capillary Blood Glucose: 343 mg/dl. General Notes: glucose per pt report yesterday Respiratory normal breathing without difficulty. Psychiatric this patient is able to make decisions and demonstrates good insight into disease process. Alert and Oriented x 3. pleasant and cooperative. General Notes: Patient's  wounds currently showed signs of continued and ongoing drainage although the left leg especially I felt like there was a little bit of improvement from the standpoint of the amount of drainage I was seeing as well as the fact that she had a few areas where there appeared to be some new tissue growing and sealing up where previously this was draining significantly. That is good news. Integumentary (Hair, Skin) Wound #61 status is Open. Original cause of wound was Gradually Appeared. The date acquired was: 04/20/2019. The wound has been in treatment 102 weeks. The wound is located on the Left,Circumferential Lower Leg. The wound measures 10.5cm length x 22.5cm width x 0.1cm depth; 185.55cm^2 area and 18.555cm^3 volume. There is Fat Layer (Subcutaneous Tissue) exposed. There is no tunneling or undermining noted. There is a medium amount of serosanguineous drainage noted. The  wound margin is distinct with the outline attached to the wound base. There is small (1-33%) red, pink granulation within the wound bed. There is a large (67-100%) amount of necrotic tissue within the wound bed including Adherent Slough. Wound #64 status is Open. Original cause of wound was Gradually Appeared. The date acquired was: 06/01/2019. The wound has been in treatment 96 weeks. The wound is located on the Left,Dorsal Foot. The wound measures 7.3cm length x 7.8cm width x 0.1cm depth; 44.721cm^2 area and 4.472cm^3 volume. There is Fat Layer (Subcutaneous Tissue) exposed. There is no tunneling or undermining noted. There is a medium amount of serous drainage noted. The wound margin is distinct with the outline attached to the wound base. There is medium (34-66%) pink granulation within the wound bed. There is a medium (34-66%) amount of necrotic tissue within the wound bed including Adherent Slough. General Notes: macerated Wound #67 status is Open. Original cause of wound was Gradually Appeared. The date acquired was: 05/02/2020. The wound has been in treatment 48 weeks. The wound is located on the Right,Medial Lower Leg. The wound measures 8.5cm length x 8cm width x 0.3cm depth; 53.407cm^2 area and 16.022cm^3 volume. There is Fat Layer (Subcutaneous Tissue) exposed. There is no tunneling or undermining noted. There is a medium amount of serosanguineous drainage noted. The wound margin is distinct with the outline attached to the wound base. There is small (1-33%) pink granulation within the wound bed. There is a large (67- 100%) amount of necrotic tissue within the wound bed including Adherent Slough. Wound #72 status is Open. Original cause of wound was Gradually Appeared. The date acquired was: 02/20/2021. The wound has been in treatment 6 weeks. The wound is located on the Right,Dorsal Foot. The wound measures 3.5cm length x 4.2cm width x 0.1cm depth; 11.545cm^2 area and 1.155cm^3 volume. There  is Fat Layer (Subcutaneous Tissue) exposed. There is no tunneling or undermining noted. There is a medium amount of serous drainage noted. The wound margin is distinct with the outline attached to the wound base. There is medium (34-66%) red, pink granulation within the wound bed. There is a medium (34-66%) amount of necrotic tissue within the wound bed including Adherent Slough. 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 There was a Three Layer Compression Therapy Procedure by [name], RN. Post procedure Diagnosis Wound #61: Same as Pre-Procedure Wound #67 Pre-procedure diagnosis of Wound #67 is a Diabetic Wound/Ulcer of the Lower Extremity located on the Right,Medial Lower Leg . There was a Three Layer Compression Therapy Procedure by [name], RN. Post procedure Diagnosis Wound #67: Same as Pre-Procedure Plan [facility]charge From [facility] Services: Discharge from [place] - follow up with [place] wound center Bathing/ Shower/ Hygiene: May shower and wash wound with soap and water. - with dressing changes, wash both legs with wash cloth and soap and water with dressing changes Other Bathing/Shower/Hygiene Orders/Instructions:  - Home Health to wash legs with warm soapy water and a clean wash cloth with dressing changes Edema Control - Lymphedema / SCD / Other: Lymphedema Pumps. Use Lymphedema pumps on leg(s) 2-3 times a day for 45-60 minutes. If wearing any wraps or hose, do not remove them. Continue exercising as instructed. Elevate legs to the level of the heart or above for 30 minutes daily and/or when sitting, a frequency of: - especially at night Avoid standing for long periods of time. Exercise regularly Home Health: No change in wound care orders this week; continue Home Health for wound care. May utilize formulary equivalent dressing for wound treatment orders unless otherwise specified. Dressing changes to be completed by [facility] on Monday / Wednesday / Friday except when patient has scheduled visit at [facility]. Other Home Health Orders/Instructions: - [facility] WOUND #61: - Lower Leg Wound Laterality: Left, Circumferential Cleanser: Soap and Water 3 x Per Week/30 Days Discharge Instructions: May shower and wash wound with dial antibacterial soap and water prior to dressing change. Peri-Wound Care: Sween Lotion (Moisturizing lotion) (Home Health) 3 x Per Week/30 Days Discharge Instructions: Apply moisturizing lotion to dry skin on legs Prim Dressing: KerraCel Ag Gelling Fiber Dressing, 4x5 in (silver alginate) 3 x Per Week/30 Days ary Discharge Instructions: Apply silver alginate to wound bed as instructed Secondary Dressing: ABD Pad, 8x10 (Home Health) 3 x Per Week/30 Days Discharge Instructions: Apply over primary dressing as directed. Secondary Dressing: Zetuvit Plus 4x8 in (Home Health) 3 x Per Week/30 Days Discharge Instructions: Apply over primary dressing as directed. Com pression Wrap: ThreePress (3 layer compression wrap) (Home Health) 3 x Per Week/30 Days Discharge Instructions: Apply three layer compression as directed. Pad bend of ankle with foam or ABD pad. WOUND #64: - Foot  Wound Laterality: Dorsal, Left Cleanser: Soap and Water 3 x Per Week/30 Days Discharge Instructions: May shower and wash wound with dial antibacterial soap and water prior to dressing change. Peri-Wound Care: Sween Lotion (Moisturizing lotion) (Home Health) 3 x Per Week/30 Days Discharge Instructions: Apply moisturizing lotion to dry skin on legs Prim Dressing: KerraCel Ag Gelling Fiber Dressing, 4x5 in (silver alginate) 3 x Per Week/30 Days ary Discharge Instructions: Apply silver alginate to wound bed as instructed Secondary Dressing: ABD Pad, 8x10 (Home Health) 3 x Per Week/30 Days Discharge Instructions: Apply over primary dressing as directed. Secondary Dressing: Zetuvit Plus 4x4 in (Home Health) 3 x Per Week/30 Days Discharge Instructions: or equivalent extra absorbent pad.Apply over primary dressing as needed. Com pression Wrap: ThreePress (3 layer compression wrap) (Home Health) 3 x  Per Week/30 Days Discharge Instructions: Apply three layer compression as directed. Pad bend of ankle with foam or ABD pad. WOUND #67: - Lower Leg Wound Laterality: Right, Medial Cleanser: Soap and Water 3 x Per Week/30 Days Discharge Instructions: May shower and wash wound with dial antibacterial soap and water prior to dressing change. Peri-Wound Care: Sween Lotion (Moisturizing lotion) (Home Health) 3 x Per Week/30 Days Discharge Instructions: Apply moisturizing lotion to dry skin on legs Prim Dressing: KerraCel Ag Gelling Fiber Dressing, 4x5 in (silver alginate) 3 x Per Week/30 Days ary Discharge Instructions: Apply silver alginate to wound bed as instructed Secondary Dressing: ABD Pad, 8x10 (Home Health) 3 x Per Week/30 Days Discharge Instructions: Apply over primary dressing as directed. Secondary Dressing: Zetuvit Plus 4x4 in (Home Health) 3 x Per Week/30 Days Discharge Instructions: or equivalent extra absorbent pad.Apply over primary dressing as needed. Com pression Wrap: ThreePress (3 layer  compression wrap) (Home Health) 3 x Per Week/30 Days Discharge Instructions: Apply three layer compression as directed. Pad bend of ankle with foam or ABD pad. WOUND #72: - Foot Wound Laterality: Dorsal, Right Cleanser: Soap and Water 3 x Per Week/30 Days Discharge Instructions: May shower and wash wound with dial antibacterial soap and water prior to dressing change. Peri-Wound Care: Sween Lotion (Moisturizing lotion) (Home Health) 3 x Per Week/30 Days Discharge Instructions: Apply moisturizing lotion to dry skin on legs Prim Dressing: KerraCel Ag Gelling Fiber Dressing, 4x5 in (silver alginate) 3 x Per Week/30 Days ary Discharge Instructions: Apply silver alginate to wound bed as instructed Secondary Dressing: ABD Pad, 8x10 (Home Health) 3 x Per Week/30 Days Discharge Instructions: Apply over primary dressing as directed. Secondary Dressing: Zetuvit Plus 4x4 in (Home Health) 3 x Per Week/30 Days Discharge Instructions: or equivalent extra absorbent pad.Apply over primary dressing as needed. Com pression Wrap: ThreePress (3 layer compression wrap) (Home Health) 3 x Per Week/30 Days Discharge Instructions: Apply three layer compression as directed. Pad bend of ankle with foam or ABD pad. 1. Would recommend currently that we continue with wound care measures as before using the silver alginate for the time being. 2. Also can recommend that we have the patient continue with the 3 layer compression wraps I think that still could be her best bet based on what we see going on today. 3. I am also can recommend she should be using her lymphedema pumps as well as elevating her legs sleeping in the bed and during the day getting her recliner where she can elevate her legs. All these I think are of utmost importance. Again this was reiterated in front of her son today as well. He states that he will help her get the pumps on. Vaughan Basta was present with me during the conversations today. 4. The patient does  have her primary care provider prescribing hydrocodone now so this should help her to be able to use her lymphedema pumps. Patient will be following up with the [facility] this is right at the beginning of November based on what I hear. Electronic Signature(s) Signed: 04/03/2021 4:03:51 PM By: [name] PA-C Signed: 04/04/2021 5:50:03 PM By: [name] RN, BSN Previous Signature: 04/03/2021 11:59:09 AM Version By: [name] PA-C Entered By: [name] on 04/03/2021 12:02:54 -------------------------------------------------------------------------------- SuperBill Details Patient Name: Date of Service: [name] 04/03/2021 Medical Record Number: [id number] Patient Account Number: [id number] Date of Birth/Sex: Treating RN: 1951/01/12 (70 y.o. [name] Primary Care Provider: [name] Other  Clinician: Referring Provider: Treating Provider/Extender: Sallye Ober, Whitney Weeks in Treatment: 191 Diagnosis Coding ICD-10 Codes Code Description E11.622 Type 2 diabetes mellitus with other skin ulcer I89.0 Lymphedema, not elsewhere classified I87.331 Chronic venous hypertension (idiopathic) with ulcer and inflammation of right lower extremity I87.332 Chronic venous hypertension (idiopathic) with ulcer and inflammation of left lower extremity L97.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 leg with fat layer exposed L97.822 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 Physician Procedures : CPT4 Code Description Modifier [id number] - WC PHYS LEVEL 4 - EST PT ICD-10 Diagnosis Description E11.622 Type 2 diabetes mellitus with other skin ulcer I89.0 Lymphedema, not elsewhere classified I87.331 Chronic venous hypertension (idiopathic) with  ulcer and inflammation of right lower extremity I87.332 Chronic venous hypertension (idiopathic) with ulcer and inflammation of left lower extremity Quantity: 1 Electronic Signature(s) Signed: 04/03/2021 4:03:51 PM By: [name] PA-C Signed: 04/04/2021 5:50:03 PM By: [name] RN, BSN Previous Signature: 04/03/2021 11:59:23 AM Version By: [name] PA-C Entered By: [name] on 04/03/2021 12:03:07

## 2021-04-04 NOTE — Progress Notes (Signed)
[name] ([id number]) Visit Report for 04/03/2021 Arrival Information Details Patient Name: Date of Service: [name], [name] 04/03/2021 10:15 A M Medical Record Number: [id number] Patient Account Number: [id number] Date of Birth/Sex: Treating RN: 11/05/1950 (70 y.o. [name] Primary Care Daleena Rotter: Guy Sandifer Other Clinician: Referring Aristeo Hankerson: Treating Beni Turrell/Extender: Alger Simons, Whitney Weeks in Treatment: 191 Visit Information History Since Last Visit Added or deleted any medications: Yes Patient Arrived: Wheel Chair Any new allergies or adverse reactions: No Arrival Time: 10:34 Had a fall or experienced change in No Accompanied By: son activities of daily living that may affect Transfer Assistance: Manual risk of falls: Patient Identification Verified: Yes Signs or symptoms of abuse/neglect since last visito No Secondary Verification Process Completed: Yes Hospitalized since last visit: No Patient Requires Transmission-Based Precautions: No Implantable device outside of the clinic excluding No Patient Has Alerts: Yes cellular tissue based products placed in the center Patient Alerts: R ABI= 1.01 since last visit: L ABI = .99 Has Dressing in Place as Prescribed: No Has Compression in Place as Prescribed: No Pain Present Now: Yes Electronic Signature(s) Signed: 04/04/2021 5:50:03 PM By: Zenaida Deed RN, BSN Entered By: Zenaida Deed on 04/03/2021 10:40:35 -------------------------------------------------------------------------------- Compression Therapy Details Patient Name: Date of Service: [name]. 04/03/2021 10:15 A M Medical Record Number: [id number] Patient Account Number: [id number] Date of Birth/Sex: Treating RN: 07-03-1950 (70 y.o. [name] Primary Care [name]: Guy Sandifer Other Clinician: Referring [name]: Treating [name]/Extender: [name] Weeks in Treatment:  191 Compression Therapy Performed for Wound Assessment: Wound #61 Left,Circumferential Lower Leg Performed By: Clinician Zenaida Deed, RN Compression Type: Three Layer Post Procedure Diagnosis Same as Pre-procedure Electronic Signature(s) Signed: 04/04/2021 5:50:03 PM By: Zenaida Deed RN, BSN Entered By: Zenaida Deed on 04/03/2021 12:02:19 -------------------------------------------------------------------------------- Compression Therapy Details Patient Name: Date of Service: [name]. 04/03/2021 10:15 A M Medical Record Number: [id number] Patient Account Number: [id number] Date of Birth/Sex: Treating RN: [birth year] (70 y.o. [name] Primary Care Mekhi Lascola: Guy Sandifer Other Clinician: Referring Kire Ferg: Treating [name]/Extender: [name] Weeks in Treatment: 191 Compression Therapy Performed for Wound Assessment: Wound #67 Right,Medial Lower Leg Performed By: Clinician Zenaida Deed, RN Compression Type: Three Layer Post Procedure Diagnosis Same as Pre-procedure Electronic Signature(s) Signed: 04/04/2021 5:50:03 PM By: Zenaida Deed RN, BSN Entered By: Zenaida Deed on 04/03/2021 12:02:19 -------------------------------------------------------------------------------- Encounter Discharge Information Details Patient Name: Date of Service: [name]. 04/03/2021 10:15 A M Medical Record Number: [id number] Patient Account Number: [id number] Date of Birth/Sex: Treating RN: [birth year] (70 y.o. [name] Primary Care [name]: Guy Sandifer Other Clinician: Referring [name]: Treating [name]/Extender: Alger Simons, Whitney Weeks in Treatment: [phone] Encounter Discharge Information Items Discharge Condition: Stable Ambulatory Status: Wheelchair Discharge Destination: Home Transportation: Private Auto Accompanied By: son Schedule Follow-up Appointment: Yes Clinical Summary of Care:  Patient Declined Electronic Signature(s) Signed: 04/04/2021 5:50:03 PM By: Zenaida Deed RN, BSN Entered By: Zenaida Deed on 04/03/2021 12:04:01 -------------------------------------------------------------------------------- Lower Extremity Assessment Details Patient Name: Date of Service: [name]. 04/03/2021 10:15 A M Medical Record Number: [id number] Patient Account Number: [id number] Date of Birth/Sex: Treating RN: [birth year] (70 y.o. [name] Primary Care [name]: Guy Sandifer Other Clinician: Referring [name]: Treating [name]/Extender: Alger Simons, Whitney Weeks in Treatment: 191 Edema Assessment Assessed: [Left: No] [Right: No] Edema: [Left: Yes] [Right: Yes] Calf Left: Right: Point of Measurement: 33 cm From Medial Instep 39.2 cm 41 cm Ankle Left: Right: Point  of Measurement: 9 cm From Medial Instep 29.5 cm 25.5 cm Vascular Assessment Pulses: Dorsalis Pedis Palpable: [Left:No] [Right:No] Electronic Signature(s) Signed: 04/04/2021 5:50:03 PM By: Zenaida Deed RN, BSN Entered By: Zenaida Deed on 04/03/2021 11:00:42 -------------------------------------------------------------------------------- Multi-Disciplinary Care Plan Details Patient Name: Date of Service: [name]. 04/03/2021 10:15 A M Medical Record Number: [id number] Patient Account Number: [id number] Date of Birth/Sex: Treating RN: [birth year] (70 y.o. [name] Primary Care [name]: Guy Sandifer Other Clinician: Referring [name]: Treating [name]/Extender: [name] Weeks in Treatment: [phone] Multidisciplinary Care Plan reviewed with physician Active Inactive Electronic Signature(s) Signed: 04/04/2021 5:50:03 PM By: Zenaida Deed RN, BSN Entered By: Zenaida Deed on 04/03/2021 12:05:06 -------------------------------------------------------------------------------- Pain Assessment Details Patient Name: Date  of Service: [name]. 04/03/2021 10:15 A M Medical Record Number: [id number] Patient Account Number: [id number] Date of Birth/Sex: Treating RN: Nov 05, 1950 (70 y.o. [name] Primary Care Dorrian Doggett: Guy Sandifer Other Clinician: Referring [name]: Treating [name]/Extender: Alger Simons, Whitney Weeks in Treatment: [phone] Active Problems Location of Pain Severity and Description of Pain Patient Has Paino Yes Site Locations Pain Location: Pain Location: Pain in Ulcers With Dressing Change: Yes Duration of the Pain. Constant / Intermittento Constant Rate the pain. Current Pain Level: 9 Character of Pain Describe the Pain: Aching Pain Management and Medication Current Pain Management: Medication: Yes Is the Current Pain Management Adequate: Adequate How does your wound impact your activities of daily livingo Sleep: Yes Bathing: No Appetite: No Relationship With Others: No Bladder Continence: No Emotions: Yes Bowel Continence: No Hobbies: Yes Toileting: No Dressing: No Electronic Signature(s) Signed: 04/04/2021 5:50:03 PM By: Zenaida Deed RN, BSN Entered By: Zenaida Deed on 04/03/2021 10:48:28 -------------------------------------------------------------------------------- Patient/Caregiver Education Details Patient Name: Date of Service: [name] 10/19/2022andnbsp10:15 A M Medical Record Number: [id number] Patient Account Number: [id number] Date of Birth/Gender: Treating RN: 1951-01-20 (70 y.o. [name] Primary Care Physician: Guy Sandifer Other Clinician: Referring Physician: Treating Physician/Extender: [name] Weeks in Treatment: [phone] Education Assessment Education Provided To: Patient Education Topics Provided Venous: Methods: Explain/Verbal Responses: Reinforcements needed, State content correctly Wound/Skin Impairment: Methods: Explain/Verbal Responses: Reinforcements needed,  State content correctly Electronic Signature(s) Signed: 04/04/2021 5:50:03 PM By: Zenaida Deed RN, BSN Entered By: Zenaida Deed on 04/03/2021 11:29:32 -------------------------------------------------------------------------------- Wound Assessment Details Patient Name: Date of Service: [name]. 04/03/2021 10:15 A M Medical Record Number: [id number] Patient Account Number: [id number] Date of Birth/Sex: Treating RN: [birth year] (70 y.o. [name] Primary Care Wilmetta Speiser: Guy Sandifer Other Clinician: Referring Zeyad Delaguila: Treating Sadiyah Kangas/Extender: Alger Simons, Whitney Weeks in Treatment: 191 Wound Status Wound Number: 61 Primary Venous Leg Ulcer Etiology: Wound Location: Left, Circumferential Lower Leg Wound Open Wounding Event: Gradually Appeared Status: Date Acquired: 04/20/2019 Comorbid Asthma, Hypertension, Peripheral Arterial Disease, Peripheral Weeks Of Treatment: 102 History: Venous Disease, Type II Diabetes, Gout, Osteoarthritis Clustered Wound: Yes Photos Wound Measurements Length: (cm) 10.5 Width: (cm) 22.5 Depth: (cm) 0.1 Area: (cm) 185.55 Volume: (cm) 18.555 % Reduction in Area: -2360.9% % Reduction in Volume: -2360.9% Epithelialization: Medium (34-66%) Tunneling: No Undermining: No Wound Description Classification: Full Thickness Without Exposed Support Structures Wound Margin: Distinct, outline attached Exudate Amount: Medium Exudate Type: Serosanguineous Exudate Color: red, brown Foul Odor After Cleansing: No Slough/Fibrino Yes Wound Bed Granulation Amount: Small (1-33%) Exposed Structure Granulation Quality: Red, Pink Fascia Exposed: No Necrotic Amount: Large (67-100%) Fat Layer (Subcutaneous Tissue) Exposed: Yes Necrotic Quality: Adherent Slough Tendon Exposed: No Muscle Exposed: No Joint Exposed: No  Bone Exposed: No Electronic Signature(s) Signed: 04/04/2021 5:50:03 PM By: Zenaida Deed RN, BSN Entered  By: Zenaida Deed on 04/03/2021 11:09:31 -------------------------------------------------------------------------------- Wound Assessment Details Patient Name: Date of Service: [name]. 04/03/2021 10:15 A M Medical Record Number: [id number] Patient Account Number: [id number] Date of Birth/Sex: Treating RN: Oct 04, 1950 (70 y.o. [name] Primary Care Ciena Sampley: Guy Sandifer Other Clinician: Referring Deette Revak: Treating Jontay Maston/Extender: Alger Simons, Whitney Weeks in Treatment: 191 Wound Status Wound Number: 64 Primary Diabetic Wound/Ulcer of the Lower Extremity Etiology: Wound Location: Left, Dorsal Foot Wound Open Wounding Event: Gradually Appeared Status: Date Acquired: 06/01/2019 Comorbid Asthma, Hypertension, Peripheral Arterial Disease, Peripheral Weeks Of Treatment: 96 History: Venous Disease, Type II Diabetes, Gout, Osteoarthritis Clustered Wound: No Photos Wound Measurements Length: (cm) 7.3 Width: (cm) 7.8 Depth: (cm) 0.1 Area: (cm) 44.721 Volume: (cm) 4.472 % Reduction in Area: -27003.6% % Reduction in Volume: -9026.5% Epithelialization: Medium (34-66%) Tunneling: No Undermining: No Wound Description Classification: Grade 2 Wound Margin: Distinct, outline attached Exudate Amount: Medium Exudate Type: Serous Exudate Color: amber Foul Odor After Cleansing: No Slough/Fibrino Yes Wound Bed Granulation Amount: Medium (34-66%) Exposed Structure Granulation Quality: Pink Fascia Exposed: No Necrotic Amount: Medium (34-66%) Fat Layer (Subcutaneous Tissue) Exposed: Yes Necrotic Quality: Adherent Slough Tendon Exposed: No Muscle Exposed: No Joint Exposed: No Bone Exposed: No Assessment Notes macerated Electronic Signature(s) Signed: 04/04/2021 5:50:03 PM By: Zenaida Deed RN, BSN Entered By: Zenaida Deed on 04/03/2021 11:10:19 -------------------------------------------------------------------------------- Wound  Assessment Details Patient Name: Date of Service: [name]. 04/03/2021 10:15 A M Medical Record Number: [id number] Patient Account Number: [id number] Date of Birth/Sex: Treating RN: 12-13-1950 (70 y.o. [name] Primary Care Windi Toro: Guy Sandifer Other Clinician: Referring Zarra Geffert: Treating Verlyn Dannenberg/Extender: Alger Simons, Whitney Weeks in Treatment: 191 Wound Status Wound Number: 67 Primary Diabetic Wound/Ulcer of the Lower Extremity Etiology: Wound Location: Right, Medial Lower Leg Wound Open Wounding Event: Gradually Appeared Status: Date Acquired: 05/02/2020 Comorbid Asthma, Hypertension, Peripheral Arterial Disease, Peripheral Weeks Of Treatment: 48 History: Venous Disease, Type II Diabetes, Gout, Osteoarthritis Clustered Wound: No Photos Wound Measurements Length: (cm) 8.5 Width: (cm) 8 Depth: (cm) 0.3 Area: (cm) 53.407 Volume: (cm) 16.022 % Reduction in Area: -3677% % Reduction in Volume: -11263.1% Epithelialization: Small (1-33%) Tunneling: No Undermining: No Wound Description Classification: Grade 1 Wound Margin: Distinct, outline attached Exudate Amount: Medium Exudate Type: Serosanguineous Exudate Color: red, brown Foul Odor After Cleansing: No Slough/Fibrino Yes Wound Bed Granulation Amount: Small (1-33%) Exposed Structure Granulation Quality: Pink Fascia Exposed: No Necrotic Amount: Large (67-100%) Fat Layer (Subcutaneous Tissue) Exposed: Yes Necrotic Quality: Adherent Slough Tendon Exposed: No Muscle Exposed: No Joint Exposed: No Bone Exposed: No Electronic Signature(s) Signed: 04/04/2021 5:50:03 PM By: Zenaida Deed RN, BSN Entered By: Zenaida Deed on 04/03/2021 11:11:36 -------------------------------------------------------------------------------- Wound Assessment Details Patient Name: Date of Service: [name]. 04/03/2021 10:15 A M Medical Record Number: [id number] Patient Account Number:  [id number] Date of Birth/Sex: Treating RN: 06-15-1951 (70 y.o. Billy Coast, Linda Primary Care Kamorah Nevils: Guy Sandifer Other Clinician: Referring Aamirah Salmi: Treating Katalin Colledge/Extender: Alger Simons, Whitney Weeks in Treatment: 191 Wound Status Wound Number: 72 Primary Lymphedema Etiology: Wound Location: Right, Dorsal Foot Secondary Diabetic Wound/Ulcer of the Lower Extremity Wounding Event: Gradually Appeared Etiology: Date Acquired: 02/20/2021 Wound Open Weeks Of Treatment: 6 Status: Clustered Wound: No Comorbid Asthma, Hypertension, Peripheral Arterial Disease, Peripheral History: Venous Disease, Type II Diabetes, Gout, Osteoarthritis Photos Wound Measurements Length: (cm) 3.5 Width: (cm) 4.2 Depth: (cm) 0.1 Area: (cm) 11.545  Volume: (cm) 1.155 % Reduction in Area: -308.4% % Reduction in Volume: -308.1% Epithelialization: Small (1-33%) Tunneling: No Undermining: No Wound Description Classification: Full Thickness Without Exposed Support Structures Wound Margin: Distinct, outline attached Exudate Amount: Medium Exudate Type: Serous Exudate Color: amber Foul Odor After Cleansing: No Slough/Fibrino Yes Wound Bed Granulation Amount: Medium (34-66%) Exposed Structure Granulation Quality: Red, Pink Fascia Exposed: No Necrotic Amount: Medium (34-66%) Fat Layer (Subcutaneous Tissue) Exposed: Yes Necrotic Quality: Adherent Slough Tendon Exposed: No Muscle Exposed: No Joint Exposed: No Bone Exposed: No Electronic Signature(s) Signed: 04/04/2021 5:50:03 PM By: Zenaida Deed RN, BSN Entered By: Zenaida Deed on 04/03/2021 11:12:34 -------------------------------------------------------------------------------- Vitals Details Patient Name: Date of Service: [name]. 04/03/2021 10:15 A M Medical Record Number: [id number] Patient Account Number: [id number] Date of Birth/Sex: Treating RN: January 13, 1951 (70 y.o. [name] Primary Care  Lucresia Simic: Guy Sandifer Other Clinician: Referring Eliana Lueth: Treating Korrine Sicard/Extender: Alger Simons, Whitney Weeks in Treatment: 191 Vital Signs Time Taken: 10:44 Temperature (F): 99.2 Height (in): 62 Pulse (bpm): 86 Weight (lbs): 335 Respiratory Rate (breaths/min): 20 Body Mass Index (BMI): 61.3 Blood Pressure (mmHg): 150/67 Capillary Blood Glucose (mg/dl): 599 Reference Range: 80 - 120 mg / dl Notes glucose per pt report yesterday Electronic Signature(s) Signed: 04/04/2021 5:50:03 PM By: Zenaida Deed RN, BSN Entered By: Zenaida Deed on 04/03/2021 10:45:52

## 2021-04-18 ENCOUNTER — Other Ambulatory Visit: Payer: Self-pay

## 2021-04-18 DIAGNOSIS — I872 Venous insufficiency (chronic) (peripheral): Secondary | ICD-10-CM

## 2021-04-24 ENCOUNTER — Encounter: Payer: Self-pay | Admitting: Vascular Surgery

## 2021-04-24 ENCOUNTER — Other Ambulatory Visit: Payer: Self-pay

## 2021-04-24 ENCOUNTER — Ambulatory Visit: Payer: Medicare PPO | Admitting: Vascular Surgery

## 2021-04-24 ENCOUNTER — Ambulatory Visit (HOSPITAL_COMMUNITY)
Admission: RE | Admit: 2021-04-24 | Discharge: 2021-04-24 | Disposition: A | Discharge status: Home / Self Care | Payer: Medicare PPO | Source: Ambulatory Visit | Attending: Vascular Surgery | Admitting: Vascular Surgery

## 2021-04-24 VITALS — BP 156/86 | HR 110 | Temp 98.0°F | Resp 20 | Ht 65.0 in | Wt 314.0 lb

## 2021-04-24 DIAGNOSIS — I872 Venous insufficiency (chronic) (peripheral): Secondary | ICD-10-CM

## 2021-04-24 NOTE — Progress Notes (Signed)
Patient ID: [name], female   DOB: [birth year], 70 y.o.   MRN: [id number]  Reason for Consult: Follow-up   Referred by Chaney Malling, PA  Subjective:     HPI:  [name] is a 70 y.o. female previously seen in our office for temporal artery biopsy and more recently for C6 venous disease with lymphedema as well.  She is currently followed at the wound care center.  She did see a second opinion wound care center yesterday.  She does have lymphedema pumps but she does not use them.  She states that mostly she sits around all day unfortunately has depressed mood at the time.  She does not elevate her legs well because of discomfort.  She does have 3 layer dressings that are in place these were not removed for visualization today.  She is minimally ambulatory currently in a wheelchair.  Past Medical History:  Diagnosis Date   Anxiety    Arthritis    "back, arms, legs" (03/24/2016)   Asthma    Chronic lower back pain    Colonic polyp    last colonoscopy done in 2009 with normal results per medical record   Depressive disorder    Gastric polyp    Gout    has taken allopurinol 352m once daily in past   Headache    History of hiatal hernia    Hypertension    Migraine    "none in awhile; might have a couple/year" (03/24/2016)   Mixed hyperlipidemia    01/2016 Total chol 141, HDL 59, LDL 63, ration 1.1   Osteoarthritis    TIA (transient ischemic attack) 11/2014   [place]    Family History  Problem Relation Age of Onset   Stroke Father    Stroke Brother    Cancer Other    Gout Mother    Hypertension Mother    Arthritis Mother    Asthma Mother    Stroke Brother    Past Surgical History:  Procedure Laterality Date   ARTERY BIOPSY Right 08/12/2016   Procedure: BIOPSY TEMPORAL ARTERY;  Surgeon: CElam [name] MD;  [facility]  Service: Vascular;  Laterality: Right;  BIOPSY TEMPORAL ARTERY   BREAST BIOPSY Left ~ 2015   benign    CARPAL TUNNEL RELEASE Bilateral    DILATION AND CURETTAGE OF UTERUS     [place]   [place] Social History:  Social History   Tobacco Use   Smoking status: Never   Smokeless tobacco: Never  Substance Use Topics   Alcohol use: No    Allergies  Allergen Reactions   Ace Inhibitors Swelling   Tizanidine    Metformin And Related Other (See Comments)    CHILLS   Propofol Itching    Current Outpatient Medications  Medication Sig Dispense Refill   ACCU-CHEK FASTCLIX LANCETS MISC 1 each. Check blood sugar once daily     ACCU-CHEK SMARTVIEW test strip 1 each 4 (four) times daily. for testing  0   acetaminophen (TYLENOL) 500 MG tablet Take 500 mg by mouth daily as needed for moderate pain.     allopurinol (ZYLOPRIM) 300 MG tablet Take 300 mg by mouth daily.     atenolol (TENORMIN) 25 MG tablet Take 25-50 mg by mouth See admin instructions. Take 50 mg every morning  and then 25 mg every evening     Blood Glucose Monitoring Suppl (ACCU-CHEK NANO SMARTVIEW) w/Device KIT 1 each See admin instructions.  0   furosemide (LASIX) 20 MG tablet Take 1 tablet (20 mg total) by mouth daily. (Patient taking differently: Take 40 mg by mouth daily.) 30 tablet 0   gabapentin (NEURONTIN) 300 MG capsule Take 2 capsules (600 mg total) by mouth 2 (two) times daily.     hydrocerin (EUCERIN) CREA Apply 1 application topically daily. (Patient taking differently: Apply 1 application topically daily as needed (dry skin).) 228 g 0   HYDROcodone-acetaminophen (NORCO) 7.5-325 MG tablet Take 1 tablet by mouth every 6 (six) hours as needed for moderate pain.     insulin glargine (LANTUS) 100 UNIT/ML Solostar Pen Inject 35 Units into the skin every morning. 45 mL 3   Insulin Pen Needle (PENTIPS) 32G X 4 MM MISC 1 each by Does not apply route daily. 30 each 0   NOVOFINE 32G X 6 MM MISC 1 each as needed.      polyethylene glycol (MIRALAX / GLYCOLAX) packet Take 17 g by mouth daily as needed for moderate constipation.     potassium chloride (K-DUR) 10 MEQ tablet Take 1 tablet (10 mEq total) by mouth daily. 30 tablet 0   protein supplement shake (PREMIER PROTEIN) LIQD Take 325 mLs (11 oz total) by mouth daily.  0   No current facility-administered medications for this visit.    Review of Systems  Constitutional:  Constitutional negative. HENT: HENT negative.  Eyes: Eyes negative.  Cardiovascular: Positive for leg swelling.  GI: Gastrointestinal negative.  Musculoskeletal: Positive for leg pain.  Skin: Positive for wound.  Neurological: Neurological negative. Hematologic: Hematologic/lymphatic negative.  Psychiatric: Positive for depressed mood.       Objective:  Objective   Vitals:   04/24/21 1533  BP: (!) 156/86  Pulse: (!) 110  Resp: 20  Temp: 98 F (36.7 C)  SpO2: 94%  Weight: (!) 314 lb (142.4 kg)  Height: _0  (1.651 m)   Body mass index is 52.25 kg/m.  Physical Exam Constitutional:      Appearance: She is obese.  HENT:     Head: Normocephalic.     Nose:     Comments: Wearing a mask Eyes:     Pupils: Pupils are equal, round, and reactive to light.  Cardiovascular:     Rate and Rhythm: Normal rate.     Pulses:          Popliteal pulses are 2+ on the right side and 2+ on the left side.     Comments: Cannot palpate femoral pulses 2/2 habitus Abdominal:     General: Abdomen is flat.     Palpations: Abdomen is soft.  Musculoskeletal:     Cervical back: Neck supple.  Skin:    Comments: Dressings in place bilateral legs  Neurological:     General: No focal deficit present.  Psychiatric:        Mood and Affect: Mood normal.        Behavior: Behavior normal.        Thought Content: Thought content normal.        Judgment: Judgment normal.    Data: +--------------+---------+------+-----------+------------+--------+  LEFT          Reflux NoRefluxReflux  TimeDiameter cmsComments                          Yes                                   +--------------+---------+------+-----------+------------+--------+  CFV           no                                              +--------------+---------+------+-----------+------------+--------+  FV prox       no                                              +--------------+---------+------+-----------+------------+--------+  FV mid        no                                              +--------------+---------+------+-----------+------------+--------+  FV dist       no                                              +--------------+---------+------+-----------+------------+--------+  Popliteal     no                                              +--------------+---------+------+-----------+------------+--------+  GSV at [facility]    no                           0.700              +--------------+---------+------+-----------+------------+--------+  GSV prox thighno                           0.754              +--------------+---------+------+-----------+------------+--------+  GSV mid thigh no                           0.921              +--------------+---------+------+-----------+------------+--------+  GSV dist thighno                            0823              +--------------+---------+------+-----------+------------+--------+  GSV at knee   no                           0.700              +--------------+---------+------+-----------+------------+--------+  GSV prox calf                              0.528              +--------------+---------+------+-----------+------------+--------+  GSV mid calf                               0.432              +--------------+---------+------+-----------+------------+--------+  SSV Pop Fossa           yes    >500 ms     0.584               +--------------+---------+------+-----------+------------+--------+  SSV prox calf           yes    >500 ms     0.546              +--------------+---------+------+-----------+------------+--------+  SSV mid calf                               0.586              +--------------+---------+------+-----------+------------+--------+      Summary:  Left:  - No evidence of deep vein thrombosis seen in the left lower extremity,  from the common femoral through the popliteal veins.  - No evidence of superficial venous thrombosis in the left lower  extremity.  - No evidence of superficial venous reflux seen in the left greater  saphenous vein.  - Venous reflux is noted in the left short saphenous vein.      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  Unfortunately she has been noncompliant with leg elevation and her lymphedema pumps.  She does previously have triphasic waveforms I cannot palpate pulses today due to the presence of 3 layer dressings but she does have popliteal pulses to suggest this is likely not an arterial issue.  Her small saphenous vein has some reflux but given her wounds I think this would be difficult to treat on the left and I think her wounds would not necessarily be affected by this.  We have discussed weight loss and continued compression.  We can see her on an as-needed basis.    Waynetta Sandy MD [facility]

## 2021-05-15 ENCOUNTER — Ambulatory Visit: Payer: Medicare PPO | Admitting: Endocrinology

## 2021-05-21 ENCOUNTER — Ambulatory Visit (INDEPENDENT_AMBULATORY_CARE_PROVIDER_SITE_OTHER): Payer: Medicare PPO | Admitting: Endocrinology

## 2021-05-21 ENCOUNTER — Other Ambulatory Visit: Payer: Self-pay

## 2021-05-21 VITALS — BP 110/42 | HR 102 | Ht 65.0 in

## 2021-05-21 DIAGNOSIS — E119 Type 2 diabetes mellitus without complications: Secondary | ICD-10-CM

## 2021-05-21 DIAGNOSIS — Z794 Long term (current) use of insulin: Secondary | ICD-10-CM

## 2021-05-21 LAB — POCT GLYCOSYLATED HEMOGLOBIN (HGB A1C): Hemoglobin A1C: 11.5 % — AB (ref 4.0–5.6)

## 2021-05-21 MED ORDER — INSULIN GLARGINE 100 UNIT/ML SOLOSTAR PEN: 70.0000 [IU] | PEN_INJECTOR | SUBCUTANEOUS | 3 refills | Status: DC

## 2021-05-21 MED ORDER — ACCU-CHEK SMARTVIEW VI STRP: 1.0000 | ORAL_STRIP | Freq: Two times a day (BID) | 3 refills | Status: DC

## 2021-05-21 NOTE — Patient Instructions (Addendum)
check your blood sugar twice a day.  vary the time of day when you check, between before the 3 meals, and at bedtime.  also check if you have symptoms of your blood sugar being too high or too low.  please keep a record of the readings and bring it to your next appointment here (or you can bring the meter itself).  You can write it on any piece of paper.  please call us sooner if your blood sugar goes below 70, or if most of your readings are over 200.   We will need to take this complex situation in stages.   Please continue the same Lantus: 70 units each morning.  Please have your husband give you the shot, and check your blood sugar.  On this type of insulin schedule, you should eat meals on a regular schedule.  If a meal is missed or significantly delayed, your blood sugar could go low.  Please come back for a follow-up appointment in 2 months.

## 2021-05-21 NOTE — Progress Notes (Signed)
Subjective:    Patient ID: [name], female    DOB: 17-Jan-1951, 70 y.o.   MRN: [id number]  HPI Pt returns for f/u of diabetes mellitus: DM type: Insulin-requiring type 2 Dx'ed: 6767 Complications: PN, foot ulcers, and PAD Therapy: insulin since 2012 GDM: never DKA: never Severe hypoglycemia: never Pancreatitis: never Pancreatic imaging: none known SDOH: She cannot afford brand name meds; husb helps with rx, due to pt's memory loss Other: due to memory loss, pt is not a candidate for multiple daily injections Interval history: Pt gives her own insulin.  She takes 70 units qam.  Pt says she sometimes misses the insulin.  no cbg record, but states cbg's vary from 92-400.  Husband is called into the room.  We discussed his help in rx of DM.  He agrees.  Past Medical History:  Diagnosis Date   Anxiety    Arthritis    "back, arms, legs" (03/24/2016)   Asthma    Chronic lower back pain    Colonic polyp    last colonoscopy done in 2009 with normal results per medical record   Depressive disorder    Gastric polyp    Gout    has taken allopurinol 32m once daily in past   Headache    History of hiatal hernia    Hypertension    Migraine    "none in awhile; might have a couple/year" (03/24/2016)   Mixed hyperlipidemia    01/2016 Total chol 141, HDL 59, LDL 63, ration 1.1   Osteoarthritis    TIA (transient ischemic attack) 11/2014   [place]     Past Surgical History:  Procedure Laterality Date   ARTERY BIOPSY Right 08/12/2016   Procedure: BIOPSY TEMPORAL ARTERY;  Surgeon: CElam [name] MD;  [facility]  Service: Vascular;  Laterality: Right;  BIOPSY TEMPORAL ARTERY   BREAST BIOPSY Left ~ 2015   benign   CARPAL TUNNEL RELEASE Bilateral    DILATION AND CURETTAGE OF UTERUS     [place]   Social History   Socioeconomic  History   Marital status: Married    Spouse name: Not on file   Number of children: Not on file   Years of education: 12+   Highest education level: Not on file  Occupational History   Not on file  Tobacco Use   Smoking status: Never   Smokeless tobacco: Never  Vaping Use   Vaping Use: Never used  Substance and Sexual Activity   Alcohol use: No   Drug use: No   Sexual activity: Never  Other Topics Concern   Not on file  Social History Narrative   Right handed   Caffeine use: none   Lives with husband   [place]  Food Insecurity: Not on file  Transportation Needs: Not on file  Physical Activity: Not on file  Stress: Not on file  Social Connections: Not on file  Intimate Partner Violence: Not on file    Current Outpatient Medications on File Prior to Visit  Medication Sig Dispense Refill   ACCU-CHEK FASTCLIX LANCETS MISC 1 each. Check blood sugar once daily     acetaminophen (TYLENOL) 500 MG tablet Take 500 mg by mouth daily as  needed for moderate pain.     allopurinol (ZYLOPRIM) 300 MG tablet Take 300 mg by mouth daily.     atenolol (TENORMIN) 25 MG tablet Take 25-50 mg by mouth See admin instructions. Take 50 mg every morning and then 25 mg every evening     Blood Glucose Monitoring Suppl (ACCU-CHEK NANO SMARTVIEW) w/Device KIT 1 each See admin instructions.  0   furosemide (LASIX) 20 MG tablet Take 1 tablet (20 mg total) by mouth daily. (Patient taking differently: Take 40 mg by mouth daily.) 30 tablet 0   gabapentin (NEURONTIN) 300 MG capsule Take 2 capsules (600 mg total) by mouth 2 (two) times daily.     hydrocerin (EUCERIN) CREA Apply 1 application topically daily. (Patient taking differently: Apply 1 application topically daily as needed (dry skin).) 228 g 0   Insulin Pen Needle (PENTIPS) 32G X 4 MM MISC 1 each by Does not apply route daily. 30 each 0   NOVOFINE 32G  X 6 MM MISC 1 each as needed.     polyethylene glycol (MIRALAX / GLYCOLAX) packet Take 17 g by mouth daily as needed for moderate constipation.     potassium chloride (K-DUR) 10 MEQ tablet Take 1 tablet (10 mEq total) by mouth daily. 30 tablet 0   protein supplement shake (PREMIER PROTEIN) LIQD Take 325 mLs (11 oz total) by mouth daily.  0   No current facility-administered medications on file prior to visit.    Allergies  Allergen Reactions   Ace Inhibitors Swelling   Tizanidine    Metformin And Related Other (See Comments)    CHILLS   Propofol Itching    Family History  Problem Relation Age of Onset   Stroke Father    Stroke Brother    Cancer Other    Gout Mother    Hypertension Mother    Arthritis Mother    Asthma Mother    Stroke Brother     BP (!) 110/42   Pulse (!) 102   Ht 5' 5"  (1.651 m)   SpO2 97%   BMI 52.25 kg/m    Review of Systems     Objective:   Physical Exam    A1c=11.5%    Assessment & Plan:  Memory loss: I discussed with husband.  He agrees to start giving pt insulin each morning.   Insulin-requiring type 2 DM: uncontrolled.   Patient Instructions  check your blood sugar twice a day.  vary the time of day when you check, between before the 3 meals, and at bedtime.  also check if you have symptoms of your blood sugar being too high or too low.  please keep a record of the readings and bring it to your next appointment here (or you can bring the meter itself).  You can write it on any piece of paper.  please call us sooner if your blood sugar goes below 70, or if most of your readings are over 200.   We will need to take this complex situation in stages.   Please continue the same Lantus: 70 units each morning.  Please have your husband give you the shot, and check your blood sugar.  On this type of insulin schedule, you should eat meals on a regular schedule.  If a meal is missed or significantly delayed, your blood sugar could go low.  Please  come back for a follow-up appointment in 2 months.

## 2021-06-25 NOTE — Patient Instructions (Signed)
Below is our plan:  We will continue to monitor memory concerns. I am going to refer you to psychiatrist to help manage depression. Please continue close follow up with PCP for management of comorbidities.   Please make sure you are staying well hydrated. I recommend 50-60 ounces daily. Well balanced diet and regular exercise encouraged. Consistent sleep schedule with 6-8 hours recommended.   Please continue follow up with care team as directed.   Follow up with me in 6 months  You may receive a survey regarding today's visit. I encourage you to leave honest feed back as I do use this information to improve patient care. Thank you for seeing me today!  Management of Memory Problems   There are some general things you can do to help manage your memory problems.  Your memory may not in fact recover, but by using techniques and strategies you will be able to manage your memory difficulties better.   1)  Establish a routine. Try to establish and then stick to a regular routine.  By doing this, you will get used to what to expect and you will reduce the need to rely on your memory.  Also, try to do things at the same time of day, such as taking your medication or checking your calendar first thing in the morning. Think about think that you can do as a part of a regular routine and make a list.  Then enter them into a daily planner to remind you.  This will help you establish a routine.   2)  Organize your environment. Organize your environment so that it is uncluttered.  Decrease visual stimulation.  Place everyday items such as keys or cell phone in the same place every day (ie.  Basket next to front door) Use post it notes with a brief message to yourself (ie. Turn off light, lock the door) Use labels to indicate where things go (ie. Which cupboards are for food, dishes, etc.) Keep a notepad and pen by the telephone to take messages   3)  Memory Aids A diary or journal/notebook/daily  planner Making a list (shopping list, chore list, to do list that needs to be done) Using an alarm as a reminder (kitchen timer or cell phone alarm) Using cell phone to store information (Notes, Calendar, Reminders) Calendar/White board placed in a prominent position Post-it notes   In order for memory aids to be useful, you need to have good habits.  It's no good remembering to make a note in your journal if you don't remember to look in it.  Try setting aside a certain time of day to look in journal.   4)  Improving mood and managing fatigue. There may be other factors that contribute to memory difficulties.  Factors, such as anxiety, depression and tiredness can affect memory. Regular gentle exercise can help improve your mood and give you more energy. Simple relaxation techniques may help relieve symptoms of anxiety Try to get back to completing activities or hobbies you enjoyed doing in the past. Learn to pace yourself through activities to decrease fatigue. Find out about some local support groups where you can share experiences with others. Try and achieve 7-8 hours of sleep at night.

## 2021-06-25 NOTE — Progress Notes (Signed)
Chief Complaint  Patient presents with   Follow-up    Rm 2, w husband. Here for memory f/u. Pts husband reports memory has gotten worse. Pt is very forgetful. MMSE:21    HISTORY OF PRESENT ILLNESS:  06/26/21 ALL:  [name] is a 71 y.o. female here today for follow up for memory loss. She was last seen by Dr Felecia Shelling 02/2020. He repeated CT that was normal. Can not tolerate MRI due to claustrophobia. He started escitalopram for concerns of depression. She did not feel that it helped. She took it for a few months then discontinued.   She continues to notice short term memory difficult. She does not cook. She does not drive. She is able to dress and bathe herself. She is seen [name], NP for PCP needs. She has a long standing history of depression. She reports being on multiple antidepressants in the past but none were effective. She has seen psychology in the past but not sure it helped. She does not sleep well. She may get 2-3 hours a night. She reports traumatic events in her childhood that have kept her from sleeping well in the past. She has a strained relationship with her children. She has 4 children. She reports that they do not speak to her. She is not able to see her grandchildren. She has chronic leg pain. She has a nurse that visits three times a week for wound care. She is wheelchair bound. Not able to walk. Last A1C 11.6. She is on Lantus 70u daily. She was on glipizide but not sure why she is not taking now.   HISTORY (copied from Dr Garth Bigness previous note)  I had the pleasure seeing your patient, [name], at [facility] for neurologic consultation regarding her memory loss.   She is a 71 year old woman with memory loss.    She feels her memory is a little worse.  She notes she had some problems a couple years ago, then did better but is having more trouble again.   She notes that she is not following conversations as well and sometimes forgets a  word.   Her husband noted she seemed worse about 4-6 months ago.   He notes she seems to get intensely angry at times with very short fuse   She is less empathetic and hurtful at times.   She is apathetic.     She is binge eating sometimes.       She is sleeping poorly at night but sleeps more during the day.   She was tested for OSA a few years ago.  She has gained a few pounds over the past 6 months but weights about the same as a couple years ago.   Her husband notes she snores but has not noted any apneic signs.      She feels depressed .   She sees wound care for her left leg (diabetic ulcers) for the past 18 months and is depressed about lack of healing. She has not been on any anti-depressant   She is less mobile and now can only use walker a few steps.   She can transfer independently.  She has a power chair in the home as she can't self propel well due to shoulder pain.     I personally reviewed the MRI of the brain from 05/31/2016.  Brain volume is normal for age.  She has some scattered T2/FLAIR hyperintense foci in the hemispheres and right pons  consistent with mild chronic microvascular ischemic change.  There were no acute findings.  CT scan from 2018 showed normal brain volume   Montreal Cognitive Assessment  02/27/2020  Visuospatial/ Executive (0/5) 3  Naming (0/3) 3  Attention: Read list of digits (0/2) 1  Attention: Read list of letters (0/1) 1  Attention: Serial 7 subtraction starting at 100 (0/3) 1  Language: Repeat phrase (0/2) 1  Language : Fluency (0/1) 1  Abstraction (0/2) 2  Delayed Recall (0/5) 2  Orientation (0/6) 5  Total 20  Adjusted Score (based on education)     REVIEW OF SYSTEMS: Out of a complete 14 system review of symptoms, the patient complains only of the following symptoms, memory loss, chronic pain, depression, insomnia, skin wounds and all other reviewed systems are negative.   ALLERGIES: Allergies  Allergen Reactions   Ace Inhibitors Swelling    Tizanidine    Metformin And Related Other (See Comments)    CHILLS   Propofol Itching     HOME MEDICATIONS: Outpatient Medications Prior to Visit  Medication Sig Dispense Refill   ACCU-CHEK FASTCLIX LANCETS MISC 1 each. Check blood sugar once daily     ACCU-CHEK SMARTVIEW test strip 1 each by Other route 2 (two) times daily. And lancets 2/day 200 each 3   acetaminophen (TYLENOL) 500 MG tablet Take 500 mg by mouth daily as needed for moderate pain.     allopurinol (ZYLOPRIM) 300 MG tablet Take 300 mg by mouth daily.     atenolol (TENORMIN) 25 MG tablet Take 25-50 mg by mouth See admin instructions. Take 50 mg every morning and then 25 mg every evening     Blood Glucose Monitoring Suppl (ACCU-CHEK NANO SMARTVIEW) w/Device KIT 1 each See admin instructions.  0   furosemide (LASIX) 20 MG tablet Take 1 tablet (20 mg total) by mouth daily. (Patient taking differently: Take 40 mg by mouth daily.) 30 tablet 0   gabapentin (NEURONTIN) 300 MG capsule Take 2 capsules (600 mg total) by mouth 2 (two) times daily.     hydrocerin (EUCERIN) CREA Apply 1 application topically daily. (Patient taking differently: Apply 1 application topically daily as needed (dry skin).) 228 g 0   insulin glargine (LANTUS) 100 UNIT/ML Solostar Pen Inject 70 Units into the skin every morning. And pen needles 1/day 75 mL 3   Insulin Pen Needle (PENTIPS) 32G X 4 MM MISC 1 each by Does not apply route daily. 30 each 0   NOVOFINE 32G X 6 MM MISC 1 each as needed.     polyethylene glycol (MIRALAX / GLYCOLAX) packet Take 17 g by mouth daily as needed for moderate constipation.     potassium chloride (K-DUR) 10 MEQ tablet Take 1 tablet (10 mEq total) by mouth daily. 30 tablet 0   protein supplement shake (PREMIER PROTEIN) LIQD Take 325 mLs (11 oz total) by mouth daily.  0   No facility-administered medications prior to visit.     PAST MEDICAL HISTORY: Past Medical History:  Diagnosis Date   Anxiety    Arthritis    "back,  arms, legs" (03/24/2016)   Asthma    Chronic lower back pain    Colonic polyp    last colonoscopy done in 2009 with normal results per medical record   Depressive disorder    Gastric polyp    Gout    has taken allopurinol 355m once daily in past   Headache    History of hiatal hernia    Hypertension  Migraine    "none in awhile; might have a couple/year" (03/24/2016)   Mixed hyperlipidemia    01/2016 Total chol 141, HDL 59, LDL 63, ration 1.1   Osteoarthritis    TIA (transient ischemic attack) 11/2014   Type II diabetes mellitus ([place])      PAST SURGICAL HISTORY: Past Surgical History:  Procedure Laterality Date   ARTERY BIOPSY Right 08/12/2016   Procedure: BIOPSY TEMPORAL ARTERY;  Surgeon: [name], MD;  Location: [facility] OR;  Service: Vascular;  Laterality: Right;  BIOPSY TEMPORAL ARTERY   BREAST BIOPSY Left ~ 2015   benign   CARPAL TUNNEL RELEASE Bilateral    DILATION AND CURETTAGE OF UTERUS     KNEE ARTHROSCOPY Right 2003   in [place]     FAMILY HISTORY: Family History  Problem Relation Age of Onset   Stroke Father    Stroke Brother    Cancer Other    Gout Mother    Hypertension Mother    Arthritis Mother    Asthma Mother    Stroke Brother      SOCIAL HISTORY: Social History   Socioeconomic History   Marital status: Married    Spouse name: Not on file   Number of children: Not on file   Years of education: 12+   Highest education level: Not on file  Occupational History   Not on file  Tobacco Use   Smoking status: Never   Smokeless tobacco: Never  Vaping Use   Vaping Use: Never used  Substance and Sexual Activity   Alcohol use: No   Drug use: No   Sexual activity: Never  Other Topics Concern   Not on file  Social History Narrative   Right handed   Caffeine use: none   Lives with husband   Recently moved from [place] Strain: Not on file  Food Insecurity: Not on file  Transportation Needs: Not on file  Physical Activity: Not on file  Stress: Not on file  Social Connections: Not on file  Intimate Partner Violence: Not on file     PHYSICAL EXAM  Vitals:   06/26/21 0838  BP: (!) 147/76  Pulse: 89  Weight: (!) 310 lb 8 oz (140.8 kg)  Height: _0  (1.651 m)   Body mass index is 51.67 kg/m.  Generalized: Well developed, in no acute distress  Cardiology: normal rate and rhythm, no murmur auscultated  Respiratory: clear to auscultation bilaterally    Neurological examination  Mentation: Alert oriented to time, place, history taking. Follows all commands speech and language fluent Cranial nerve II-XII: Pupils were equal round reactive to light. Extraocular movements were full, visual field were full on confrontational test. Facial sensation and strength were normal. Uvula tongue midline. Head turning and shoulder shrug  were normal and symmetric. Motor: The motor testing reveals 5 over 5 strength of all bilateral upper extremities. Good symmetric motor tone is noted throughout.  Gait and station: Gait not assessed, wheelchair bound   DIAGNOSTIC DATA (LABS, IMAGING, TESTING) - I reviewed patient records, labs, notes, testing and imaging myself where available.  Lab Results  Component Value Date   WBC 7.2 03/06/2021   HGB 12.3 03/06/2021   HCT 38.6 03/06/2021   MCV 88.1 03/06/2021   PLT 353 03/06/2021      Component Value Date/Time  NA 136 02/06/2021 0451   K 4.5 02/06/2021 0451   CL 103 02/06/2021 0451   CO2 24 02/06/2021 0451   GLUCOSE 290 (H) 02/06/2021 0451   BUN 6 (L) 02/06/2021 0451   CREATININE 0.88 02/06/2021 0451   CREATININE 0.88 01/25/2016 0001   CALCIUM 9.4 02/06/2021 0451   PROT 8.0 02/06/2021 0451   ALBUMIN 2.7 (L) 02/06/2021 0451   AST 13 (L) 02/06/2021 0451   ALT 11 02/06/2021 0451   ALKPHOS 90 02/06/2021 0451   BILITOT 0.6 02/06/2021 0451    GFRNONAA >60 02/06/2021 0451   GFRAA >60 11/18/2019 0251   Lab Results  Component Value Date   CHOL 100 06/22/2019   HDL 39 (L) 06/22/2019   LDLCALC 48 06/22/2019   TRIG 64 06/22/2019   CHOLHDL 2.6 06/22/2019   Lab Results  Component Value Date   HGBA1C 11.5 (A) 05/21/2021   Lab Results  Component Value Date   VITAMINB12 342 02/06/2021   Lab Results  Component Value Date   TSH 1.790 02/27/2020    No flowsheet data found.   Montreal Cognitive Assessment  06/26/2021 02/27/2020  Visuospatial/ Executive (0/5) 3 3  Naming (0/3) 3 3  Attention: Read list of digits (0/2) 2 1  Attention: Read list of letters (0/1) 1 1  Attention: Serial 7 subtraction starting at 100 (0/3) 2 1  Language: Repeat phrase (0/2) 2 1  Language : Fluency (0/1) 1 1  Abstraction (0/2) 2 2  Delayed Recall (0/5) 0 2  Orientation (0/6) 5 5  Total 21 20  Adjusted Score (based on education) - 64     ASSESSMENT AND PLAN  71 y.o. year old female  has a past medical history of Anxiety, Arthritis, Asthma, Chronic lower back pain, Colonic polyp, Depressive disorder, Gastric polyp, Gout, Headache, History of hiatal hernia, Hypertension, Migraine, Mixed hyperlipidemia, Osteoarthritis, TIA (transient ischemic attack) (11/2014), and Type II diabetes mellitus ([place]). here with    Memory loss  Major depressive disorder with current active episode, unspecified depression episode severity, unspecified whether recurrent - Plan: Ambulatory referral to Psychiatry  Insomnia, unspecified type  [name] has noted continued short term memory loss. I am concerned that her depression and physical comorbidies contribute. She reports being on multiple antidepressants in the past. I feel she would benefit most from a referral to a mental health specialist. I feel psychiatry and psychology input would be most helpful for her. I have offered to send her for formal neurocognitive testing, however, we will start with mood management  at this time. Will consider neurocognitive testing in the future. She was encouraged to follow up closely with PCP for comorbidity management. Memory compensation strategies reviewed. Healthy lifestyle habits encouraged.    Orders Placed This Encounter  Procedures   Ambulatory referral to Psychiatry    Referral Priority:   Routine    Referral Type:   Psychiatric    Referral Reason:   Specialty Services Required    Requested Specialty:   Psychiatry    Number of Visits Requested:   1     No orders of the defined types were placed in this encounter.   I spent 30 minutes of face-to-face and non-face-to-face time with patient.  This included previsit chart review, lab review, study review, order entry, electronic health record documentation, patient education.    Debbora Presto, MSN, FNP-C 06/26/2021, 10:00 AM  [facility] [address] [place], [place] [address] [phone]

## 2021-06-26 ENCOUNTER — Encounter: Payer: Self-pay | Admitting: Family Medicine

## 2021-06-26 ENCOUNTER — Ambulatory Visit: Payer: Medicare Other | Admitting: Family Medicine

## 2021-06-26 VITALS — BP 147/76 | HR 89 | Ht 65.0 in | Wt 310.5 lb

## 2021-06-26 DIAGNOSIS — R413 Other amnesia: Secondary | ICD-10-CM

## 2021-06-26 DIAGNOSIS — F329 Major depressive disorder, single episode, unspecified: Secondary | ICD-10-CM | POA: Diagnosis not present

## 2021-06-26 DIAGNOSIS — G4700 Insomnia, unspecified: Secondary | ICD-10-CM | POA: Diagnosis not present

## 2021-07-22 ENCOUNTER — Ambulatory Visit: Payer: Medicare PPO | Admitting: Endocrinology

## 2021-10-14 ENCOUNTER — Other Ambulatory Visit: Payer: Self-pay

## 2021-10-14 ENCOUNTER — Emergency Department (HOSPITAL_COMMUNITY)
Admission: EM | Admit: 2021-10-14 | Discharge: 2021-10-15 | Disposition: A | Discharge status: Home / Self Care | Payer: Medicare Other | Attending: Emergency Medicine | Admitting: Emergency Medicine

## 2021-10-14 DIAGNOSIS — L03116 Cellulitis of left lower limb: Secondary | ICD-10-CM | POA: Diagnosis not present

## 2021-10-14 DIAGNOSIS — L03119 Cellulitis of unspecified part of limb: Secondary | ICD-10-CM

## 2021-10-14 DIAGNOSIS — Z91199 Patient's noncompliance with other medical treatment and regimen due to unspecified reason: Secondary | ICD-10-CM

## 2021-10-14 DIAGNOSIS — Z79899 Other long term (current) drug therapy: Secondary | ICD-10-CM | POA: Diagnosis not present

## 2021-10-14 DIAGNOSIS — L03115 Cellulitis of right lower limb: Secondary | ICD-10-CM | POA: Diagnosis not present

## 2021-10-14 DIAGNOSIS — Z794 Long term (current) use of insulin: Secondary | ICD-10-CM | POA: Insufficient documentation

## 2021-10-14 DIAGNOSIS — I10 Essential (primary) hypertension: Secondary | ICD-10-CM | POA: Diagnosis not present

## 2021-10-14 DIAGNOSIS — E119 Type 2 diabetes mellitus without complications: Secondary | ICD-10-CM | POA: Diagnosis not present

## 2021-10-14 DIAGNOSIS — I872 Venous insufficiency (chronic) (peripheral): Secondary | ICD-10-CM | POA: Diagnosis present

## 2021-10-14 DIAGNOSIS — M79662 Pain in left lower leg: Secondary | ICD-10-CM | POA: Diagnosis present

## 2021-10-14 DIAGNOSIS — T148XXA Other injury of unspecified body region, initial encounter: Secondary | ICD-10-CM | POA: Diagnosis not present

## 2021-10-14 DIAGNOSIS — L089 Local infection of the skin and subcutaneous tissue, unspecified: Secondary | ICD-10-CM | POA: Diagnosis not present

## 2021-10-14 DIAGNOSIS — F32A Depression, unspecified: Secondary | ICD-10-CM | POA: Diagnosis present

## 2021-10-14 LAB — CBC WITH DIFFERENTIAL/PLATELET
Abs Immature Granulocytes: 0.02 10*3/uL (ref 0.00–0.07)
Basophils Absolute: 0 10*3/uL (ref 0.0–0.1)
Basophils Relative: 0 %
Eosinophils Absolute: 0.4 10*3/uL (ref 0.0–0.5)
Eosinophils Relative: 4 %
HCT: 38.1 % (ref 36.0–46.0)
Hemoglobin: 11.9 g/dL — ABNORMAL LOW (ref 12.0–15.0)
Immature Granulocytes: 0 %
Lymphocytes Relative: 18 %
Lymphs Abs: 1.7 10*3/uL (ref 0.7–4.0)
MCH: 27.7 pg (ref 26.0–34.0)
MCHC: 31.2 g/dL (ref 30.0–36.0)
MCV: 88.6 fL (ref 80.0–100.0)
Monocytes Absolute: 0.7 10*3/uL (ref 0.1–1.0)
Monocytes Relative: 7 %
Neutro Abs: 6.7 10*3/uL (ref 1.7–7.7)
Neutrophils Relative %: 71 %
Platelets: 414 10*3/uL — ABNORMAL HIGH (ref 150–400)
RBC: 4.3 MIL/uL (ref 3.87–5.11)
RDW: 15.3 % (ref 11.5–15.5)
WBC: 9.5 10*3/uL (ref 4.0–10.5)
nRBC: 0 % (ref 0.0–0.2)

## 2021-10-14 LAB — COMPREHENSIVE METABOLIC PANEL
ALT: 9 U/L (ref 0–44)
AST: 10 U/L — ABNORMAL LOW (ref 15–41)
Albumin: 2.9 g/dL — ABNORMAL LOW (ref 3.5–5.0)
Alkaline Phosphatase: 93 U/L (ref 38–126)
Anion gap: 9 (ref 5–15)
BUN: 10 mg/dL (ref 8–23)
CO2: 24 mmol/L (ref 22–32)
Calcium: 9.3 mg/dL (ref 8.9–10.3)
Chloride: 104 mmol/L (ref 98–111)
Creatinine, Ser: 1.03 mg/dL — ABNORMAL HIGH (ref 0.44–1.00)
GFR, Estimated: 58 mL/min — ABNORMAL LOW (ref >60)
Glucose, Bld: 214 mg/dL — ABNORMAL HIGH (ref 70–99)
Potassium: 4 mmol/L (ref 3.5–5.1)
Sodium: 137 mmol/L (ref 135–145)
Total Bilirubin: 0.3 mg/dL (ref 0.3–1.2)
Total Protein: 8.9 g/dL — ABNORMAL HIGH (ref 6.5–8.1)

## 2021-10-14 LAB — LACTIC ACID, PLASMA: Lactic Acid, Venous: 1.1 mmol/L (ref 0.5–1.9)

## 2021-10-14 NOTE — ED Provider Triage Note (Signed)
Emergency Medicine Provider Triage Evaluation Note ? ?[name] , a 71 y.o. female  was evaluated in triage.  Pt complains of bilateral leg pain and wounds.  Patient states that she is currently being seen by wound care as well as her PCP for bilateral lower extremity wounds.  The patient states that for the last week, her nurse states that they have began to appear infected.  Patient is endorsing drainage from bilateral leg wounds.  Patient denies any fevers, nausea, vomiting, chills. ? ?Review of Systems  ?Positive:  ?Negative:  ? ?Physical Exam  ?BP (!) 141/70 (BP Location: Right Arm)   Pulse 73   Temp 98.5 ?F (36.9 ?C) (Oral)   Resp 17   SpO2 100%  ?Gen:   Awake, no distress   ?Resp:  Normal effort  ?MSK:   Moves extremities without difficulty  ?Other:  Patient has bilateral leg wounds wrapped in gauze.  There is purulent drainage noted to the patient's left gauze pad. ? ?Medical Decision Making  ?Medically screening exam initiated at 8:28 PM.  Appropriate orders placed.  [name] was informed that the remainder of the evaluation will be completed by another provider, this initial triage assessment does not replace that evaluation, and the importance of remaining in the ED until their evaluation is complete. ? ? ?  ?Al Decant, PA-C ?10/14/21 2029 ? ?

## 2021-10-14 NOTE — ED Triage Notes (Signed)
Pt presents after being sent by her PCP for redness and swelling with warmth in her legs.  Pt has known diabetic ulcers to both legs that have been draining.  Wound care appt isn't until Monday.  Pt denies fevers at home and is afebrile in triage.  ?

## 2021-10-15 ENCOUNTER — Emergency Department (HOSPITAL_COMMUNITY): Payer: Medicare Other

## 2021-10-15 DIAGNOSIS — T148XXA Other injury of unspecified body region, initial encounter: Secondary | ICD-10-CM | POA: Diagnosis not present

## 2021-10-15 DIAGNOSIS — L089 Local infection of the skin and subcutaneous tissue, unspecified: Secondary | ICD-10-CM

## 2021-10-15 LAB — BASIC METABOLIC PANEL
Anion gap: 9 (ref 5–15)
BUN: 8 mg/dL (ref 8–23)
CO2: 24 mmol/L (ref 22–32)
Calcium: 9.5 mg/dL (ref 8.9–10.3)
Chloride: 105 mmol/L (ref 98–111)
Creatinine, Ser: 0.96 mg/dL (ref 0.44–1.00)
GFR, Estimated: >60 mL/min (ref >60)
Glucose, Bld: 280 mg/dL — ABNORMAL HIGH (ref 70–99)
Potassium: 3.9 mmol/L (ref 3.5–5.1)
Sodium: 138 mmol/L (ref 135–145)

## 2021-10-15 LAB — CBC WITH DIFFERENTIAL/PLATELET
Abs Immature Granulocytes: 0.03 10*3/uL (ref 0.00–0.07)
Basophils Absolute: 0 10*3/uL (ref 0.0–0.1)
Basophils Relative: 0 %
Eosinophils Absolute: 0.3 10*3/uL (ref 0.0–0.5)
Eosinophils Relative: 3 %
HCT: 38.4 % (ref 36.0–46.0)
Hemoglobin: 11.9 g/dL — ABNORMAL LOW (ref 12.0–15.0)
Immature Granulocytes: 0 %
Lymphocytes Relative: 16 %
Lymphs Abs: 1.4 10*3/uL (ref 0.7–4.0)
MCH: 27.2 pg (ref 26.0–34.0)
MCHC: 31 g/dL (ref 30.0–36.0)
MCV: 87.9 fL (ref 80.0–100.0)
Monocytes Absolute: 0.5 10*3/uL (ref 0.1–1.0)
Monocytes Relative: 7 %
Neutro Abs: 6.1 10*3/uL (ref 1.7–7.7)
Neutrophils Relative %: 74 %
Platelets: 405 10*3/uL — ABNORMAL HIGH (ref 150–400)
RBC: 4.37 MIL/uL (ref 3.87–5.11)
RDW: 15.2 % (ref 11.5–15.5)
WBC: 8.4 10*3/uL (ref 4.0–10.5)
nRBC: 0 % (ref 0.0–0.2)

## 2021-10-15 MED ORDER — AMOXICILLIN-POT CLAVULANATE 875-125 MG PO TABS
1.0000 | ORAL_TABLET | Freq: Two times a day (BID) | ORAL | 0 refills | Status: DC
Start: 2021-10-15 — End: 2022-06-12

## 2021-10-15 MED ORDER — ACETAMINOPHEN 500 MG PO TABS
1000.0000 mg | ORAL_TABLET | Freq: Once | ORAL | Status: AC
  Administered 2021-10-15: 1000 mg via ORAL
  Filled 2021-10-15: qty 2

## 2021-10-15 MED ORDER — PIPERACILLIN-TAZOBACTAM 3.375 G IVPB 30 MIN
3.3750 g | Freq: Once | INTRAVENOUS | Status: AC
  Administered 2021-10-15: 3.375 g via INTRAVENOUS
  Filled 2021-10-15: qty 50

## 2021-10-15 NOTE — Consult Note (Signed)
WOC Nurse Consult Note: ?Patient receiving care in [facility] ED007 ?Patient in today for redness and swelling with warmth in bilateral LE. Hx of care with the [facility]. Last visit 09/30/21. Hx is noncompliance with compression wraps. States she has a Engineer, civil (consulting) that comes out 3x/wk and changes her dressings. She may sometimes have to change in between because of drainage.  ?Reason for Consult: Chronic BLE ulcers  ?Wound type: Chronic Venous Stasis ulcers on the BLE with hardening of the surrounding skin from the bilateral shins down to the toes and hx of chronic lymphedema.  ?Pressure Injury POA: NA ?Measurement: LLE medial full thickness ulcer 6 x 8 x 0.2 ?Dorsal aspect of the left foot 6.3 x 6.5 x 0.1  ?Dorsal aspect of the right foot 5.5 x 5 x 0.1 ?Medial aspect of the RLE: 6.8 x 7 x 0.5 ?Wound bed: See photos in media tab ?Drainage (amount, consistency, odor) Tan/Serous with odor ?Dressing procedure/placement/frequency: ?Clean the BLE with Dial anti-bacterial soap. Pat dry and place a cut to fit piece of Aquacel Advantage over the Medial aspect of the LLE, RLE and the Dorsal aspect of the left foot. Place a whole piece of Xeroform gauze over the dorsal aspect of the RLE. Cover these dressings with ABD pads and wrap with Kerlix. These dressings ideally should be changed daily. Patient may need to change in between nurse visits.  ?I have ordered Prevalon boots for her.  Make sure she takes them home with her and keeps them on both feet while in bed and elevate the LE as much as possible.  ?Patient will be discharged home and has a follow-up appointment with the [facility]. I highly recommend she keep this appointment. ? ?Thank you for the consult. WOC nurse will not follow at this time.   ?Please re-consult the WOC team if needed. ? ?Renaldo Reel. Katrinka Blazing, MSN, RN, CMSRN, AGCNS, WTA ?Wound Treatment Associate ?Pager [phone]   ? ?  ?

## 2021-10-15 NOTE — ED Provider Notes (Signed)
?[facility] ?Provider Note ? ? ?CSN: [id number] ?Arrival date & time: 10/14/21  1929 ? ?  ? ?History ? ?Chief Complaint  ?Patient presents with  ? Leg Pain  ? ? ?[name] is a 71 y.o. female. ? ?71 year old female with prior medical history as detailed below presents for evaluation.  Patient with history of type 2 diabetes, hyperlipidemia, hypertension, H/O TIA, anxiety, gout, osteoarthritis. ? ?Patient with history of chronic lymphedema to both lower extremities.  Patient with prior history of cellulitis to same. ? ?Patient with increased drainage from both lower extremities left greater than right.  Patient reports that she was seen by her PCP yesterday and referred to the ED for likely admission and IV antibiotics. ? ?Patient denies fever.  Patient reports increased pain and drainage from both lower extremities.  She reports that she has not recently taken antibiotics for her legs. ? ?The history is provided by the patient and medical records.  ?Leg Pain ?Location:  Leg ?Injury: no   ?Leg location:  L lower leg and R lower leg ?Pain details:  ?  Quality:  Aching ?  Radiates to:  Does not radiate ?  Severity:  Unable to specify ?  Onset quality:  Unable to specify ? ?  ? ?Home Medications ?Prior to Admission medications   ?Medication Sig Start Date End Date Taking? Authorizing Provider  ?ACCU-CHEK FASTCLIX LANCETS MISC 1 each. Check blood sugar once daily 03/20/17   [provider]  ?ACCU-CHEK SMARTVIEW test strip 1 each by Other route 2 (two) times daily. And lancets 2/day 05/21/21   Renato Shin, MD  ?acetaminophen (TYLENOL) 500 MG tablet Take 500 mg by mouth daily as needed for moderate pain.    [provider]  ?allopurinol (ZYLOPRIM) 300 MG tablet Take 300 mg by mouth daily.    [provider]  ?atenolol (TENORMIN) 25 MG tablet Take 25-50 mg by mouth See admin instructions. Take 50 mg every morning and then 25 mg every evening    [provider]  ?Blood Glucose Monitoring Suppl (ACCU-CHEK NANO SMARTVIEW) w/Device KIT 1 each See admin instructions. 05/23/17   [provider]  ?furosemide (LASIX) 20 MG tablet Take 1 tablet (20 mg total) by mouth daily. ?Patient taking differently: Take 40 mg by mouth daily. 07/07/16   Thurnell Lose, MD  ?gabapentin (NEURONTIN) 300 MG capsule Take 2 capsules (600 mg total) by mouth 2 (two) times daily. 02/08/21   Sharion Settler, DO  ?hydrocerin (EUCERIN) CREA Apply 1 application topically daily. ?Patient taking differently: Apply 1 application topically daily as needed (dry skin). 01/15/16   Thurnell Lose, MD  ?insulin glargine (LANTUS) 100 UNIT/ML Solostar Pen Inject 70 Units into the skin every morning. And pen needles 1/day 05/21/21   Renato Shin, MD  ?Insulin Pen Needle (PENTIPS) 32G X 4 MM MISC 1 each by Does not apply route daily. 02/08/21   Sharion Settler, DO  ?NOVOFINE 32G X 6 MM MISC 1 each as needed. 08/09/17   [provider]  ?polyethylene glycol (MIRALAX / GLYCOLAX) packet Take 17 g by mouth daily as needed for moderate constipation.    [provider]  ?potassium chloride (K-DUR) 10 MEQ tablet Take 1 tablet (10 mEq total) by mouth daily. 07/07/16   Thurnell Lose, MD  ?protein supplement shake (PREMIER PROTEIN) LIQD Take 325 mLs (11 oz total) by mouth daily. 03/18/17   Eugenie Filler, MD  ?   ? ?  Allergies    ?Ace inhibitors, Tizanidine, Metformin and related, and Propofol   ? ?Review of Systems   ?Review of Systems  ?All other systems reviewed and are negative. ? ?Physical Exam ?Updated Vital Signs ?BP 103/64   Pulse 73   Temp (!) 97.3 ?F (36.3 ?C) (Oral)   Resp (!) 23   SpO2 100%  ?Physical Exam ?Vitals and nursing note reviewed.  ?Constitutional:   ?   General: She is not in acute distress. ?   Appearance: Normal appearance. She is well-developed.  ?HENT:  ?   Head: Normocephalic and atraumatic.  ?Eyes:  ?   Conjunctiva/sclera: Conjunctivae normal.   ?   Pupils: Pupils are equal, round, and reactive to light.  ?Cardiovascular:  ?   Rate and Rhythm: Normal rate and regular rhythm.  ?   Heart sounds: Normal heart sounds.  ?Pulmonary:  ?   Effort: Pulmonary effort is normal. No respiratory distress.  ?   Breath sounds: Normal breath sounds.  ?Abdominal:  ?   General: There is no distension.  ?   Palpations: Abdomen is soft.  ?   Tenderness: There is no abdominal tenderness.  ?Musculoskeletal:     ?   General: No deformity. Normal range of motion.  ?   Cervical back: Normal range of motion and neck supple.  ?Skin: ?   General: Skin is warm.  ?   Comments: See images below both lower extremities ? ?Chronic lymphedema with likely acute cellulitis and significant drainage from open wounds.  ?Neurological:  ?   General: No focal deficit present.  ?   Mental Status: She is alert and oriented to person, place, and time.  ? ? ? ? ? ? ? ? ? ? ? ?ED Results / Procedures / Treatments   ?Labs ?(all labs ordered are listed, but only abnormal results are displayed) ?Labs Reviewed  ?COMPREHENSIVE METABOLIC PANEL - Abnormal; Notable for the following components:  ?    Result Value  ? Glucose, Bld 214 (*)   ? Creatinine, Ser 1.03 (*)   ? Total Protein 8.9 (*)   ? Albumin 2.9 (*)   ? AST 10 (*)   ? GFR, Estimated 58 (*)   ? All other components within normal limits  ?CBC WITH DIFFERENTIAL/PLATELET - Abnormal; Notable for the following components:  ? Hemoglobin 11.9 (*)   ? Platelets 414 (*)   ? All other components within normal limits  ?CBC WITH DIFFERENTIAL/PLATELET - Abnormal; Notable for the following components:  ? Hemoglobin 11.9 (*)   ? Platelets 405 (*)   ? All other components within normal limits  ?CULTURE, BLOOD (ROUTINE X 2)  ?CULTURE, BLOOD (ROUTINE X 2)  ?LACTIC ACID, PLASMA  ?LACTIC ACID, PLASMA  ?BASIC METABOLIC PANEL  ? ? ?EKG ?EKG Interpretation ? ?Date/Time:  Tuesday Oct 15 2021 08:13:42 EDT ?Ventricular Rate:  73 ?PR Interval:  158 ?QRS Duration: 91 ?QT  Interval:  398 ?QTC Calculation: 439 ?R Axis:   -3 ?Text Interpretation: Sinus rhythm Low voltage, precordial leads Confirmed by Dene Gentry [phone]) on 10/15/2021 8:39:42 AM ? ?Radiology ?No results found. ? ?Procedures ?Procedures  ? ? ?Medications Ordered in ED ?Medications  ?piperacillin-tazobactam (ZOSYN) IVPB 3.375 g (3.375 g Intravenous New Bag/Given 10/15/21 0835)  ? ? ?ED Course/ Medical Decision Making/ A&P ?  ?                        ?Medical Decision Making ?Amount and/or  Complexity of Data Reviewed ?Labs: ordered. ?Radiology: ordered. ? ?Risk ?OTC drugs. ?Prescription drug management. ? ? ? ?Medical Screen Complete ? ?This patient presented to the ED with complaint of BLE drainage/weeping. ? ?This complaint involves an extensive number of treatment options. The initial differential diagnosis includes, but is not limited to, chronic lymphedema, acute cellulitis, metabolic abnormality, etc. ? ?This presentation is: Acute, Chronic, Self-Limited, Previously Undiagnosed, Uncertain Prognosis, Complicated, Systemic Symptoms, and Threat to Life/Bodily Function ? ?Patient with longstanding history of chronic lower extremity lymphedema. ? ?Patient reports increased drainage from open wounds on both lower extremities.  Left with greater drainage than right.  Patient reports that her primary care provider referred her to the ED for likely admission and IV antibiotics. ? ?Patient denies recent fever. ? ?Exam is concerning for significant drainage from open wounds on both lower extremities. ? ?IV Antibiotics administered here in the ED. ? ?Patient to be seen by Dr. Lorin Mercy with hospitalist service to evaluate for admission. ? ?35 Addendum -Dr. Lorin Mercy, in consultation with wound care, feels the patient would be best served with close outpatient follow-up.  Patient understands instructions on close follow-up.  Patient will be prescribed a course of outpatient antibiotics.  Strict return precautions given and understood by  the patient. ? ?Co morbidities that complicated the patient's evaluation ? ? Type 2 diabetes, hyperlipidemia, hypertension, H/O TIA, anxiety, gout, osteoarthritis. ? ? ?Additional history obtained: ? ?Additional

## 2021-10-15 NOTE — ED Notes (Signed)
Bilateral prevalon boots applied.  ?

## 2021-10-15 NOTE — Discharge Instructions (Addendum)
Return for any problem.  ? ?Take Augmentin as prescribed ? ?Follow-up closely with wound care clinic as instructed ?

## 2021-10-15 NOTE — Consult Note (Signed)
?ER Consult Note ? ? ?Patient: [name] WUJ:[id number] DOB: 1951-04-12 ?DOA: 10/14/2021 ?DOS: the patient was seen and examined on 10/15/2021 ?PCP: Chaney Malling, PA  ?Patient coming from: Home; NOK: Bettyanne, [place], [phone] ? ? ?Chief Complaint: leg wounds ? ?HPI: [name] is a 71 y.o. female with medical history significant of HTN, morbid obesity, and DM presenting with B LE edema. She has been seen at the wound care clinic and has known BLE ulcerations but is noncompliant with compression, elevation, and lymphedema pumps.  She reports long-standing leg wounds for which she sees wound care.  She has HHN several times a week and they came to see her recently and said the wounds appeared to be infected.  She has compression wraps placed, but she doesn't allow them to place the wraps on the RLE due to pain and often removes the one from the LLE because it is too tight and uncomfortable.  She is not having systemic symptoms.  The wound care nurses were "spraying antibiotics" on the wounds but she thought she needed some antibiotics by mouth instead.  ? ?She also reports severe and refractory depression.  She says that she has seen doctors and taken medications for years with ongoing symptoms.  She had a brief period of SI several months ago but that resolved. ? ?She was seen by the wound care nurse here.  She has had home health 3x/week to change dressings.  Wound care has seen and placed dressings.  She will need dressing changes at home and f/u with wound care clinic with PO antibiotics. ? ? ?ER Course:  Chronic lymphedema.  Legs look terrible, chronic issue.  Saw PCP yesterday for this issue and he sent her to the ER.  She hasn't recently been on antibiotics.   ? ? ? ? ?Review of Systems: As mentioned in the history of present illness. All other systems reviewed and are negative. ?Past Medical History:  ?Diagnosis Date  ? Anxiety   ? Arthritis   ? "back, arms, legs" (03/24/2016)  ? Asthma    ? Chronic lower back pain   ? Colonic polyp   ? last colonoscopy done in 2009 with normal results per medical record  ? Depressive disorder   ? Gastric polyp   ? Gout   ? has taken allopurinol $RemoveBeforeDEI'300mg'UKBvORPHrHDhCdum$  once daily in past  ? Headache   ? History of hiatal hernia   ? Hypertension   ? Migraine   ? "none in awhile; might have a couple/year" (03/24/2016)  ? Mixed hyperlipidemia   ? 01/2016 Total chol 141, HDL 59, LDL 63, ration 1.1  ? Osteoarthritis   ? TIA (transient ischemic attack) 11/2014  ? Type II diabetes mellitus ([place])   ? ?Past Surgical History:  ?Procedure Laterality Date  ? ARTERY BIOPSY Right 08/12/2016  ? Procedure: BIOPSY TEMPORAL ARTERY;  Surgeon: [name], MD;  Location: [facility] OR;  Service: Vascular;  Laterality: Right;  BIOPSY TEMPORAL ARTERY  ? BREAST BIOPSY Left ~ 2015  ? benign  ? CARPAL TUNNEL RELEASE Bilateral   ? DILATION AND CURETTAGE OF UTERUS    ? KNEE ARTHROSCOPY Right 2003  ? in [place]  ? LAPAROSCOPIC CHOLECYSTECTOMY    ? TUBAL LIGATION    ? VAGINAL HYSTERECTOMY  1982  ? ?Social History:  reports that she has never smoked. She has never used smokeless tobacco. She reports that she does not drink alcohol and does not use drugs. ? ?Allergies  ?  Allergen Reactions  ? Ace Inhibitors Swelling  ? Tizanidine   ? Metformin And Related Other (See Comments)  ?  CHILLS  ? Propofol Itching  ? ? ?Family History  ?Problem Relation Age of Onset  ? Stroke Father   ? Stroke Brother   ? Cancer Other   ? Gout Mother   ? Hypertension Mother   ? Arthritis Mother   ? Asthma Mother   ? Stroke Brother   ? ? ?Prior to Admission medications   ?Medication Sig Start Date End Date Taking? Authorizing Provider  ?ACCU-CHEK FASTCLIX LANCETS MISC 1 each. Check blood sugar once daily 03/20/17   [provider]  ?ACCU-CHEK SMARTVIEW test strip 1 each by Other route 2 (two) times daily. And lancets 2/day 05/21/21   Renato Shin, MD  ?acetaminophen (TYLENOL) 500 MG tablet Take 500 mg by mouth daily as needed for moderate  pain.    [provider]  ?allopurinol (ZYLOPRIM) 300 MG tablet Take 300 mg by mouth daily.    [provider]  ?atenolol (TENORMIN) 25 MG tablet Take 25-50 mg by mouth See admin instructions. Take 50 mg every morning and then 25 mg every evening    [provider]  ?Blood Glucose Monitoring Suppl (ACCU-CHEK NANO SMARTVIEW) w/Device KIT 1 each See admin instructions. 05/23/17   [provider]  ?furosemide (LASIX) 20 MG tablet Take 1 tablet (20 mg total) by mouth daily. ?Patient taking differently: Take 40 mg by mouth daily. 07/07/16   Thurnell Lose, MD  ?gabapentin (NEURONTIN) 300 MG capsule Take 2 capsules (600 mg total) by mouth 2 (two) times daily. 02/08/21   Sharion Settler, DO  ?hydrocerin (EUCERIN) CREA Apply 1 application topically daily. ?Patient taking differently: Apply 1 application topically daily as needed (dry skin). 01/15/16   Thurnell Lose, MD  ?insulin glargine (LANTUS) 100 UNIT/ML Solostar Pen Inject 70 Units into the skin every morning. And pen needles 1/day 05/21/21   Renato Shin, MD  ?Insulin Pen Needle (PENTIPS) 32G X 4 MM MISC 1 each by Does not apply route daily. 02/08/21   Sharion Settler, DO  ?NOVOFINE 32G X 6 MM MISC 1 each as needed. 08/09/17   [provider]  ?polyethylene glycol (MIRALAX / GLYCOLAX) packet Take 17 g by mouth daily as needed for moderate constipation.    [provider]  ?potassium chloride (K-DUR) 10 MEQ tablet Take 1 tablet (10 mEq total) by mouth daily. 07/07/16   Thurnell Lose, MD  ?protein supplement shake (PREMIER PROTEIN) LIQD Take 325 mLs (11 oz total) by mouth daily. 03/18/17   Eugenie Filler, MD  ? ? ?Physical Exam: ?Vitals:  ? 10/15/21 1015 10/15/21 1030 10/15/21 1115 10/15/21 1130  ?BP: 139/89 (!) 141/86 (!) 149/92 (!) 145/98  ?Pulse: 73 77 68 72  ?Resp:  20  20  ?Temp:      ?TempSrc:      ?SpO2: 99% 95% 99% 100%  ? ?General:  Appears calm and comfortable and is in NAD, sedentary ?Eyes:   EOMI, normal lids, iris ?ENT:  grossly normal hearing, lips & tongue, mmm ?Neck:  no LAD, masses or thyromegaly ?Cardiovascular:  RRR, no m/r/g.  ?Respiratory:   CTA bilaterally with no wheezes/rales/rhonchi.  Normal respiratory effort. ?Abdomen:  soft, NT, ND ?Skin:  B lymphedema with chronic stasis changes (severe) and ulcerations with foul-smelling drainage but no obvious surrounding erythema ? ? ? ? ? ? ? ? ? ?Musculoskeletal:  generalized decreased tone BUE/BLE, no  bony abnormality ?Psychiatric:  grossly normal mood and affect, speech fluent and appropriate, AOx3 ?Neurologic:  CN 2-12 grossly intact, moves all extremities in coordinated fashion ? ? ?Radiological Exams on Admission: ?Independently reviewed - see discussion in A/P where applicable ? ?DG Foot Complete Left ? ?Result Date: 10/15/2021 ?CLINICAL DATA:  Cellulitis, open wounds. EXAM: LEFT FOOT - COMPLETE 3+ VIEW COMPARISON:  None. FINDINGS: There is no evidence of fracture or dislocation. Diffuse soft tissue swelling is seen involving the mid and forefoot concerning for infection. No definite lytic destruction is noted to suggest osteomyelitis. IMPRESSION: Diffuse soft tissue swelling is noted in the midfoot and forefoot concerning for infection. No definite lytic destruction is seen to suggest osteomyelitis. Electronically Signed   By: Marijo Conception M.D.   On: 10/15/2021 09:20  ? ?DG Foot Complete Right ? ?Result Date: 10/15/2021 ?CLINICAL DATA:  Cellulitis EXAM: RIGHT FOOT COMPLETE - 3+ VIEW COMPARISON:  02/05/2021 FINDINGS: Diffuse osseous demineralization. No acute fracture. No dislocation. No definite erosive changes. Generalized soft tissue swelling. No soft tissue gas is seen. IMPRESSION: 1. Generalized soft tissue swelling. 2. No acute osseous abnormality. Electronically Signed   By: Davina Poke D.O.   On: 10/15/2021 09:22   ? ?EKG: Independently reviewed.  NSR with rate 73; no evidence of acute ischemia ? ? ?Labs on Admission: I have  personally reviewed the available labs and imaging studies at the time of the admission. ? ?Pertinent labs:   ? ?Glucose 214 ?Albumin 2.9 ?BUN 10/Creatinine 1.03/GFR 58 ?Lactate 1.1 ?WBC 8.4 ?Hgb 11.9 - stable

## 2021-10-20 LAB — CULTURE, BLOOD (ROUTINE X 2)
Culture: NO GROWTH
Culture: NO GROWTH
Special Requests: ADEQUATE

## 2021-11-01 ENCOUNTER — Other Ambulatory Visit (HOSPITAL_COMMUNITY): Payer: Self-pay

## 2021-12-24 NOTE — Progress Notes (Deleted)
No chief complaint on file.   HISTORY OF PRESENT ILLNESS:  12/24/21 ALL:  [name] returns for follow up for memory loss. She was referred to behavioral health at last visit due to concerns of anxiety and depression.   06/26/2021 ALL:  [name] is a 71 y.o. female here today for follow up for memory loss. She was last seen by Dr Felecia Shelling 02/2020. He repeated CT that was normal. Can not tolerate MRI due to claustrophobia. He started escitalopram for concerns of depression. She did not feel that it helped. She took it for a few months then discontinued.   She continues to notice short term memory difficult. She does not cook. She does not drive. She is able to dress and bathe herself. She is seen [name] Councilman, NP for PCP needs. She has a long standing history of depression. She reports being on multiple antidepressants in the past but none were effective. She has seen psychology in the past but not sure it helped. She does not sleep well. She may get 2-3 hours a night. She reports traumatic events in her childhood that have kept her from sleeping well in the past. She has a strained relationship with her children. She has 4 children. She reports that they do not speak to her. She is not able to see her grandchildren. She has chronic leg pain. She has a nurse that visits three times a week for wound care. She is wheelchair bound. Not able to walk. Last A1C 11.6. She is on Lantus 70u daily. She was on glipizide but not sure why she is not taking now.   HISTORY (copied from Dr Garth Bigness previous note)  I had the pleasure seeing your patient, [name], at [facility] for neurologic consultation regarding her memory loss.   She is a 71 year old woman with memory loss.    She feels her memory is a little worse.  She notes she had some problems a couple years ago, then did better but is having more trouble again.   She notes that she is not following conversations as well and  sometimes forgets a word.   Her husband noted she seemed worse about 4-6 months ago.   He notes she seems to get intensely angry at times with very short fuse   She is less empathetic and hurtful at times.   She is apathetic.     She is binge eating sometimes.       She is sleeping poorly at night but sleeps more during the day.   She was tested for OSA a few years ago.  She has gained a few pounds over the past 6 months but weights about the same as a couple years ago.   Her husband notes she snores but has not noted any apneic signs.      She feels depressed .   She sees wound care for her left leg (diabetic ulcers) for the past 18 months and is depressed about lack of healing. She has not been on any anti-depressant   She is less mobile and now can only use walker a few steps.   She can transfer independently.  She has a power chair in the home as she can't self propel well due to shoulder pain.     I personally reviewed the MRI of the brain from 05/31/2016.  Brain volume is normal for age.  She has some scattered T2/FLAIR hyperintense foci in the hemispheres and right  pons consistent with mild chronic microvascular ischemic change.  There were no acute findings.  CT scan from 2018 showed normal brain volume   Montreal Cognitive Assessment  02/27/2020  Visuospatial/ Executive (0/5) 3  Naming (0/3) 3  Attention: Read list of digits (0/2) 1  Attention: Read list of letters (0/1) 1  Attention: Serial 7 subtraction starting at 100 (0/3) 1  Language: Repeat phrase (0/2) 1  Language : Fluency (0/1) 1  Abstraction (0/2) 2  Delayed Recall (0/5) 2  Orientation (0/6) 5  Total 20  Adjusted Score (based on education)     REVIEW OF SYSTEMS: Out of a complete 14 system review of symptoms, the patient complains only of the following symptoms, memory loss, chronic pain, depression, insomnia, skin wounds and all other reviewed systems are negative.   ALLERGIES: Allergies  Allergen Reactions   Ace  Inhibitors Swelling   Tizanidine    Metformin And Related Other (See Comments)    CHILLS   Propofol Itching     HOME MEDICATIONS: Outpatient Medications Prior to Visit  Medication Sig Dispense Refill   ACCU-CHEK FASTCLIX LANCETS MISC 1 each. Check blood sugar once daily     ACCU-CHEK SMARTVIEW test strip 1 each by Other route 2 (two) times daily. And lancets 2/day 200 each 3   acetaminophen (TYLENOL) 500 MG tablet Take 500 mg by mouth daily as needed for moderate pain.     allopurinol (ZYLOPRIM) 300 MG tablet Take 300 mg by mouth daily.     amoxicillin-clavulanate (AUGMENTIN) 875-125 MG tablet Take 1 tablet by mouth every 12 (twelve) hours. 14 tablet 0   atenolol (TENORMIN) 25 MG tablet Take 25-50 mg by mouth See admin instructions. Take 50 mg every morning and then 25 mg every evening     Blood Glucose Monitoring Suppl (ACCU-CHEK NANO SMARTVIEW) w/Device KIT 1 each See admin instructions.  0   furosemide (LASIX) 20 MG tablet Take 1 tablet (20 mg total) by mouth daily. (Patient taking differently: Take 40 mg by mouth daily.) 30 tablet 0   gabapentin (NEURONTIN) 300 MG capsule Take 2 capsules (600 mg total) by mouth 2 (two) times daily.     hydrocerin (EUCERIN) CREA Apply 1 application topically daily. (Patient taking differently: Apply 1 application topically daily as needed (dry skin).) 228 g 0   insulin glargine (LANTUS) 100 UNIT/ML Solostar Pen Inject 70 Units into the skin every morning. And pen needles 1/day 75 mL 3   Insulin Pen Needle (PENTIPS) 32G X 4 MM MISC 1 each by Does not apply route daily. 30 each 0   NOVOFINE 32G X 6 MM MISC 1 each as needed.     polyethylene glycol (MIRALAX / GLYCOLAX) packet Take 17 g by mouth daily as needed for moderate constipation.     potassium chloride (K-DUR) 10 MEQ tablet Take 1 tablet (10 mEq total) by mouth daily. 30 tablet 0   protein supplement shake (PREMIER PROTEIN) LIQD Take 325 mLs (11 oz total) by mouth daily.  0   No  facility-administered medications prior to visit.     PAST MEDICAL HISTORY: Past Medical History:  Diagnosis Date   Anxiety    Arthritis    "back, arms, legs" (03/24/2016)   Asthma    Chronic lower back pain    Colonic polyp    last colonoscopy done in 2009 with normal results per medical record   Depressive disorder    Gastric polyp    Gout    has taken  allopurinol 365m once daily in past   Headache    History of hiatal hernia    Hypertension    Migraine    "none in awhile; might have a couple/year" (03/24/2016)   Mixed hyperlipidemia    01/2016 Total chol 141, HDL 59, LDL 63, ration 1.1   Osteoarthritis    TIA (transient ischemic attack) 11/2014   Type II diabetes mellitus ([place]      PAST SURGICAL HISTORY: Past Surgical History:  Procedure Laterality Date   ARTERY BIOPSY Right 08/12/2016   Procedure: BIOPSY TEMPORAL ARTERY;  Surgeon: CElam [name] MD;  Location: [facility] OR;  Service: Vascular;  Laterality: Right;  BIOPSY TEMPORAL ARTERY   BREAST BIOPSY Left ~ 2015   benign   CARPAL TUNNEL RELEASE Bilateral    DILATION AND CURETTAGE OF UTERUS     [place]    FAMILY HISTORY: Family History  Problem Relation Age of Onset   Stroke Father    Stroke Brother    Cancer Other    Gout Mother    Hypertension Mother    Arthritis Mother    Asthma Mother    Stroke Brother      SOCIAL HISTORY: Social History   Socioeconomic History   Marital status: Married    Spouse name: Not on file   Number of children: Not on file   Years of education: 12+   Highest education level: Not on file  Occupational History   Not on file  Tobacco Use   Smoking status: Never   Smokeless tobacco: Never  Vaping Use   Vaping Use: Never used  Substance and Sexual Activity   Alcohol use: No   Drug use: No   Sexual activity: Never  Other Topics Concern   Not on file   Social History Narrative   Right handed   Caffeine use: none   [place]ives with husband   [place]  Food Insecurity: Not on file  Transportation Needs: Not on file  Physical Activity: Not on file  Stress: Not on file  Social Connections: Not on file  Intimate Partner Violence: Not on file     PHYSICAL EXAM  There were no vitals filed for this visit.  There is no height or weight on file to calculate BMI.  Generalized: Well developed, in no acute distress  Cardiology: normal rate and rhythm, no murmur auscultated  Respiratory: clear to auscultation bilaterally    Neurological examination  Mentation: Alert oriented to time, place, history taking. Follows all commands speech and language fluent Cranial nerve II-XII: Pupils were equal round reactive to light. Extraocular movements were full, visual field were full on confrontational test. Facial sensation and strength were normal. Uvula tongue midline. Head turning and shoulder shrug  were normal and symmetric. Motor: The motor testing reveals 5 over 5 strength of all bilateral upper extremities. Good symmetric motor tone is noted throughout.  Gait and station: Gait not assessed, wheelchair bound   DIAGNOSTIC DATA (LABS, IMAGING, TESTING) - I reviewed patient records, labs, notes, testing and imaging myself where available.  Lab Results  Component Value Date   WBC 8.4 10/15/2021   HGB 11.9 (L) 10/15/2021   HCT 38.4 10/15/2021   MCV 87.9 10/15/2021   PLT 405 (  H) 10/15/2021      Component Value Date/Time   NA 138 10/15/2021 0824   K 3.9 10/15/2021 0824   CL 105 10/15/2021 0824   CO2 24 10/15/2021 0824   GLUCOSE 280 (H) 10/15/2021 0824   BUN 8 10/15/2021 0824   CREATININE 0.96 10/15/2021 0824   CREATININE 0.88 01/25/2016 0001   CALCIUM 9.5 10/15/2021 0824   PROT 8.9 (H) 10/14/2021 2030   ALBUMIN 2.9 (L) 10/14/2021 2030    AST 10 (L) 10/14/2021 2030   ALT 9 10/14/2021 2030   ALKPHOS 93 10/14/2021 2030   BILITOT 0.3 10/14/2021 2030   GFRNONAA >60 10/15/2021 0824   GFRAA >60 11/18/2019 0251   Lab Results  Component Value Date   CHOL 100 06/22/2019   HDL 39 (L) 06/22/2019   LDLCALC 48 06/22/2019   TRIG 64 06/22/2019   CHOLHDL 2.6 06/22/2019   Lab Results  Component Value Date   HGBA1C 11.5 (A) 05/21/2021   Lab Results  Component Value Date   VITAMINB12 342 02/06/2021   Lab Results  Component Value Date   TSH 1.790 02/27/2020        No data to display              06/26/2021    8:42 AM 02/27/2020   10:33 AM  Montreal Cognitive Assessment   Visuospatial/ Executive (0/5) 3 3  Naming (0/3) 3 3  Attention: Read list of digits (0/2) 2 1  Attention: Read list of letters (0/1) 1 1  Attention: Serial 7 subtraction starting at 100 (0/3) 2 1  Language: Repeat phrase (0/2) 2 1  Language : Fluency (0/1) 1 1  Abstraction (0/2) 2 2  Delayed Recall (0/5) 0 2  Orientation (0/6) 5 5  Total 21 20  Adjusted Score (based on education)  20     ASSESSMENT AND PLAN  71 y.o. year old female  has a past medical history of Anxiety, Arthritis, Asthma, Chronic lower back pain, Colonic polyp, Depressive disorder, Gastric polyp, Gout, Headache, History of hiatal hernia, Hypertension, Migraine, Mixed hyperlipidemia, Osteoarthritis, TIA (transient ischemic attack) (11/2014), and Type II diabetes mellitus ([place]). here with    No diagnosis found.  Sumaya has noted continued short term memory loss. I am concerned that her depression and physical comorbidies contribute. She reports being on multiple antidepressants in the past. I feel she would benefit most from a referral to a mental health specialist. I feel psychiatry and psychology input would be most helpful for her. I have offered to send her for formal neurocognitive testing, however, we will start with mood management at this time. Will consider  neurocognitive testing in the future. She was encouraged to follow up closely with PCP for comorbidity management. Memory compensation strategies reviewed. Healthy lifestyle habits encouraged.    No orders of the defined types were placed in this encounter.    No orders of the defined types were placed in this encounter.    [name] Presto, MSN, FNP-C 12/24/2021, 4:26 PM  [facility] [address] [place], [place] [address] [phone]

## 2021-12-25 ENCOUNTER — Encounter: Payer: Self-pay | Admitting: Family Medicine

## 2021-12-25 ENCOUNTER — Ambulatory Visit: Payer: Self-pay | Admitting: Family Medicine

## 2021-12-25 DIAGNOSIS — R413 Other amnesia: Secondary | ICD-10-CM

## 2022-03-15 DIAGNOSIS — Z91199 Patient's noncompliance with other medical treatment and regimen due to unspecified reason: Secondary | ICD-10-CM | POA: Insufficient documentation

## 2022-03-15 DIAGNOSIS — I87331 Chronic venous hypertension (idiopathic) with ulcer and inflammation of right lower extremity: Secondary | ICD-10-CM | POA: Insufficient documentation

## 2022-03-15 DIAGNOSIS — I83019 Varicose veins of right lower extremity with ulcer of unspecified site: Secondary | ICD-10-CM | POA: Insufficient documentation

## 2022-03-15 DIAGNOSIS — E119 Type 2 diabetes mellitus without complications: Secondary | ICD-10-CM | POA: Insufficient documentation

## 2022-03-15 DIAGNOSIS — I872 Venous insufficiency (chronic) (peripheral): Secondary | ICD-10-CM | POA: Insufficient documentation

## 2022-03-31 NOTE — Progress Notes (Deleted)
No chief complaint on file.   HISTORY OF PRESENT ILLNESS:  03/31/22 ALL:  [name] returns for follow up for memory loss. She was last seen 06/2021. Concerns for depression discussed and referral placed to psychiatry. PCP placed referral to [facility] but declined.   06/26/2021 ALL: [name] is a 71 y.o. female here today for follow up for memory loss. She was last seen by Dr Felecia Shelling 02/2020. He repeated CT that was normal. Can not tolerate MRI due to claustrophobia. He started escitalopram for concerns of depression. She did not feel that it helped. She took it for a few months then discontinued.   She continues to notice short term memory difficult. She does not cook. She does not drive. She is able to dress and bathe herself. She is seen [name] Councilman, NP for PCP needs. She has a long standing history of depression. She reports being on multiple antidepressants in the past but none were effective. She has seen psychology in the past but not sure it helped. She does not sleep well. She may get 2-3 hours a night. She reports traumatic events in her childhood that have kept her from sleeping well in the past. She has a strained relationship with her children. She has 4 children. She reports that they do not speak to her. She is not able to see her grandchildren. She has chronic leg pain. She has a nurse that visits three times a week for wound care. She is wheelchair bound. Not able to walk. Last A1C 11.6. She is on Lantus 70u daily. She was on glipizide but not sure why she is not taking now.   HISTORY (copied from Dr [name] previous note)  I had the pleasure seeing your patient, [name], at [facility] for neurologic consultation regarding her memory loss.   She is a 71 year old woman with memory loss.    She feels her memory is a little worse.  She notes she had some problems a couple years ago, then did better but is having more trouble again.   She notes that she is not  following conversations as well and sometimes forgets a word.   Her husband noted she seemed worse about 4-6 months ago.   He notes she seems to get intensely angry at times with very short fuse   She is less empathetic and hurtful at times.   She is apathetic.     She is binge eating sometimes.       She is sleeping poorly at night but sleeps more during the day.   She was tested for OSA a few years ago.  She has gained a few pounds over the past 6 months but weights about the same as a couple years ago.   Her husband notes she snores but has not noted any apneic signs.      She feels depressed .   She sees wound care for her left leg (diabetic ulcers) for the past 18 months and is depressed about lack of healing. She has not been on any anti-depressant   She is less mobile and now can only use walker a few steps.   She can transfer independently.  She has a power chair in the home as she can't self propel well due to shoulder pain.     I personally reviewed the MRI of the brain from 05/31/2016.  Brain volume is normal for age.  She has some scattered T2/FLAIR hyperintense foci in  the hemispheres and right pons consistent with mild chronic microvascular ischemic change.  There were no acute findings.  CT scan from 2018 showed normal brain volume   Montreal Cognitive Assessment  02/27/2020  Visuospatial/ Executive (0/5) 3  Naming (0/3) 3  Attention: Read list of digits (0/2) 1  Attention: Read list of letters (0/1) 1  Attention: Serial 7 subtraction starting at 100 (0/3) 1  Language: Repeat phrase (0/2) 1  Language : Fluency (0/1) 1  Abstraction (0/2) 2  Delayed Recall (0/5) 2  Orientation (0/6) 5  Total 20  Adjusted Score (based on education)     REVIEW OF SYSTEMS: Out of a complete 14 system review of symptoms, the patient complains only of the following symptoms, memory loss, chronic pain, depression, insomnia, skin wounds and all other reviewed systems are  negative.   ALLERGIES: Allergies  Allergen Reactions   Ace Inhibitors Swelling   Tizanidine    Metformin And Related Other (See Comments)    CHILLS   Propofol Itching     HOME MEDICATIONS: Outpatient Medications Prior to Visit  Medication Sig Dispense Refill   ACCU-CHEK FASTCLIX LANCETS MISC 1 each. Check blood sugar once daily     ACCU-CHEK SMARTVIEW test strip 1 each by Other route 2 (two) times daily. And lancets 2/day 200 each 3   acetaminophen (TYLENOL) 500 MG tablet Take 500 mg by mouth daily as needed for moderate pain.     allopurinol (ZYLOPRIM) 300 MG tablet Take 300 mg by mouth daily.     amoxicillin-clavulanate (AUGMENTIN) 875-125 MG tablet Take 1 tablet by mouth every 12 (twelve) hours. 14 tablet 0   atenolol (TENORMIN) 25 MG tablet Take 25-50 mg by mouth See admin instructions. Take 50 mg every morning and then 25 mg every evening     Blood Glucose Monitoring Suppl (ACCU-CHEK NANO SMARTVIEW) w/Device KIT 1 each See admin instructions.  0   furosemide (LASIX) 20 MG tablet Take 1 tablet (20 mg total) by mouth daily. (Patient taking differently: Take 40 mg by mouth daily.) 30 tablet 0   gabapentin (NEURONTIN) 300 MG capsule Take 2 capsules (600 mg total) by mouth 2 (two) times daily.     hydrocerin (EUCERIN) CREA Apply 1 application topically daily. (Patient taking differently: Apply 1 application topically daily as needed (dry skin).) 228 g 0   insulin glargine (LANTUS) 100 UNIT/ML Solostar Pen Inject 70 Units into the skin every morning. And pen needles 1/day 75 mL 3   Insulin Pen Needle (PENTIPS) 32G X 4 MM MISC 1 each by Does not apply route daily. 30 each 0   NOVOFINE 32G X 6 MM MISC 1 each as needed.     polyethylene glycol (MIRALAX / GLYCOLAX) packet Take 17 g by mouth daily as needed for moderate constipation.     potassium chloride (K-DUR) 10 MEQ tablet Take 1 tablet (10 mEq total) by mouth daily. 30 tablet 0   protein supplement shake (PREMIER PROTEIN) LIQD Take  325 mLs (11 oz total) by mouth daily.  0   No facility-administered medications prior to visit.     PAST MEDICAL HISTORY: Past Medical History:  Diagnosis Date   Anxiety    Arthritis    "back, arms, legs" (03/24/2016)   Asthma    Chronic lower back pain    Colonic polyp    last colonoscopy done in 2009 with normal results per medical record   Depressive disorder    Gastric polyp    Gout  has taken allopurinol 318m once daily in past   Headache    History of hiatal hernia    Hypertension    Migraine    "none in awhile; might have a couple/year" (03/24/2016)   Mixed hyperlipidemia    01/2016 Total chol 141, HDL 59, LDL 63, ration 1.1   Osteoarthritis    TIA (transient ischemic attack) 11/2014   [place] II diabetes [place]      PAST SURGICAL HISTORY: Past Surgical History:  Procedure Laterality Date   ARTERY BIOPSY Right 08/12/2016   Procedure: BIOPSY TEMPORAL ARTERY;  Surgeon: CElam [name] MD;  Location: [facility] OR;  Service: Vascular;  Laterality: Right;  BIOPSY TEMPORAL ARTERY   BREAST BIOPSY Left ~ 2015   benign   CARPAL TUNNEL RELEASE Bilateral    DILATION AND CURETTAGE OF UTERUS     [place]    FAMILY HISTORY: Family History  Problem Relation Age of Onset   Stroke Father    Stroke Brother    Cancer Other    Gout Mother    Hypertension Mother    Arthritis Mother    Asthma Mother    Stroke Brother      SOCIAL HISTORY: Social History   Socioeconomic History   Marital status: Married    Spouse name: Not on file   Number of children: Not on file   Years of education: 12+   Highest education level: Not on file  Occupational History   Not on file  Tobacco Use   Smoking status: Never   Smokeless tobacco: Never  Vaping Use   Vaping Use: Never used  Substance and Sexual Activity   Alcohol use: No   Drug use: No   Sexual activity:  Never  Other Topics Concern   Not on file  Social History Narrative   Right handed   Caffeine use: none   Lives with husband   [place]  Food Insecurity: Not on file  Transportation Needs: Not on file  Physical Activity: Not on file  Stress: Not on file  Social Connections: Not on file  Intimate Partner Violence: Not on file     PHYSICAL EXAM  There were no vitals filed for this visit.  There is no height or weight on file to calculate BMI.  Generalized: Well developed, in no acute distress  Cardiology: normal rate and rhythm, no murmur auscultated  Respiratory: clear to auscultation bilaterally    Neurological examination  Mentation: Alert oriented to time, place, history taking. Follows all commands speech and language fluent Cranial nerve II-XII: Pupils were equal round reactive to light. Extraocular movements were full, visual field were full on confrontational test. Facial sensation and strength were normal. Uvula tongue midline. Head turning and shoulder shrug  were normal and symmetric. Motor: The motor testing reveals 5 over 5 strength of all bilateral upper extremities. Good symmetric motor tone is noted throughout.  Gait and station: Gait not assessed, wheelchair bound   DIAGNOSTIC DATA (LABS, IMAGING, TESTING) - I reviewed patient records, labs, notes, testing and imaging myself where available.  Lab Results  Component Value Date   WBC 8.4 10/15/2021   HGB 11.9 (L) 10/15/2021   HCT 38.4 10/15/2021   MCV 87.9 10/15/2021  PLT 405 (H) 10/15/2021      Component Value Date/Time   NA 138 10/15/2021 0824   K 3.9 10/15/2021 0824   CL 105 10/15/2021 0824   CO2 24 10/15/2021 0824   GLUCOSE 280 (H) 10/15/2021 0824   BUN 8 10/15/2021 0824   CREATININE 0.96 10/15/2021 0824   CREATININE 0.88 01/25/2016 0001   CALCIUM 9.5 10/15/2021 0824   PROT 8.9 (H)  10/14/2021 2030   ALBUMIN 2.9 (L) 10/14/2021 2030   AST 10 (L) 10/14/2021 2030   ALT 9 10/14/2021 2030   ALKPHOS 93 10/14/2021 2030   BILITOT 0.3 10/14/2021 2030   GFRNONAA >60 10/15/2021 0824   GFRAA >60 11/18/2019 0251   Lab Results  Component Value Date   CHOL 100 06/22/2019   HDL 39 (L) 06/22/2019   LDLCALC 48 06/22/2019   TRIG 64 06/22/2019   CHOLHDL 2.6 06/22/2019   Lab Results  Component Value Date   HGBA1C 11.5 (A) 05/21/2021   Lab Results  Component Value Date   VITAMINB12 342 02/06/2021   Lab Results  Component Value Date   TSH 1.790 02/27/2020        No data to display              06/26/2021    8:42 AM 02/27/2020   10:33 AM  Montreal Cognitive Assessment   Visuospatial/ Executive (0/5) 3 3  Naming (0/3) 3 3  Attention: Read list of digits (0/2) 2 1  Attention: Read list of letters (0/1) 1 1  Attention: Serial 7 subtraction starting at 100 (0/3) 2 1  Language: Repeat phrase (0/2) 2 1  Language : Fluency (0/1) 1 1  Abstraction (0/2) 2 2  Delayed Recall (0/5) 0 2  Orientation (0/6) 5 5  Total 21 20  Adjusted Score (based on education)  20     ASSESSMENT AND PLAN  71 y.o. year old female  has a past medical history of Anxiety, Arthritis, Asthma, Chronic lower back pain, Colonic polyp, Depressive disorder, Gastric polyp, Gout, Headache, History of hiatal hernia, Hypertension, Migraine, Mixed hyperlipidemia, Osteoarthritis, TIA (transient ischemic attack) (11/2014), and Type II diabetes mellitus ([place]). here with    No diagnosis found.  Emmilia has noted continued short term memory loss. I am concerned that her depression and physical comorbidies contribute. She reports being on multiple antidepressants in the past. I feel she would benefit most from a referral to a mental health specialist. I feel psychiatry and psychology input would be most helpful for her. I have offered to send her for formal neurocognitive testing, however, we will start with  mood management at this time. Will consider neurocognitive testing in the future. She was encouraged to follow up closely with PCP for comorbidity management. Memory compensation strategies reviewed. Healthy lifestyle habits encouraged.    No orders of the defined types were placed in this encounter.    No orders of the defined types were placed in this encounter.   I spent 30 minutes of face-to-face and non-face-to-face time with patient.  This included previsit chart review, lab review, study review, order entry, electronic health record documentation, patient education.    Debbora Presto, MSN, FNP-C 03/31/2022, 8:29 AM  [facility] [address] [place], [place] [address] [phone]

## 2022-04-01 ENCOUNTER — Ambulatory Visit: Payer: Medicare Other | Admitting: Family Medicine

## 2022-04-01 DIAGNOSIS — F329 Major depressive disorder, single episode, unspecified: Secondary | ICD-10-CM

## 2022-04-01 DIAGNOSIS — R413 Other amnesia: Secondary | ICD-10-CM

## 2022-04-01 NOTE — Progress Notes (Unsigned)
No chief complaint on file.   HISTORY OF PRESENT ILLNESS:  04/01/22 ALL:  Shaquitta returns for follow up for memory loss. She was last seen 06/2021. Concerns for depression discussed and referral placed to psychiatry. PCP placed referral to [facility] but declined.   06/26/2021 ALL: MAYO OWCZARZAK is a 71 y.o. female here today for follow up for memory loss. She was last seen by Dr Felecia Shelling 02/2020. He repeated CT that was normal. Can not tolerate MRI due to claustrophobia. He started escitalopram for concerns of depression. She did not feel that it helped. She took it for a few months then discontinued.   She continues to notice short term memory difficult. She does not cook. She does not drive. She is able to dress and bathe herself. She is seen [name], NP for PCP needs. She has a long standing history of depression. She reports being on multiple antidepressants in the past but none were effective. She has seen psychology in the past but not sure it helped. She does not sleep well. She may get 2-3 hours a night. She reports traumatic events in her childhood that have kept her from sleeping well in the past. She has a strained relationship with her children. She has 4 children. She reports that they do not speak to her. She is not able to see her grandchildren. She has chronic leg pain. She has a nurse that visits three times a week for wound care. She is wheelchair bound. Not able to walk. Last A1C 11.6. She is on Lantus 70u daily. She was on glipizide but not sure why she is not taking now.   HISTORY (copied from Dr Garth Bigness previous note)  I had the pleasure seeing your patient, Dessiree Sze, at [facility] for neurologic consultation regarding her memory loss.   She is a 71 year old woman with memory loss.    She feels her memory is a little worse.  She notes she had some problems a couple years ago, then did better but is having more trouble again.   She notes that she is not  following conversations as well and sometimes forgets a word.   Her husband noted she seemed worse about 4-6 months ago.   He notes she seems to get intensely angry at times with very short fuse   She is less empathetic and hurtful at times.   She is apathetic.     She is binge eating sometimes.       She is sleeping poorly at night but sleeps more during the day.   She was tested for OSA a few years ago.  She has gained a few pounds over the past 6 months but weights about the same as a couple years ago.   Her husband notes she snores but has not noted any apneic signs.      She feels depressed .   She sees wound care for her left leg (diabetic ulcers) for the past 18 months and is depressed about lack of healing. She has not been on any anti-depressant   She is less mobile and now can only use walker a few steps.   She can transfer independently.  She has a power chair in the home as she can't self propel well due to shoulder pain.     I personally reviewed the MRI of the brain from 05/31/2016.  Brain volume is normal for age.  She has some scattered T2/FLAIR hyperintense foci in  the hemispheres and right pons consistent with mild chronic microvascular ischemic change.  There were no acute findings.  CT scan from 2018 showed normal brain volume   Montreal Cognitive Assessment  02/27/2020  Visuospatial/ Executive (0/5) 3  Naming (0/3) 3  Attention: Read list of digits (0/2) 1  Attention: Read list of letters (0/1) 1  Attention: Serial 7 subtraction starting at 100 (0/3) 1  Language: Repeat phrase (0/2) 1  Language : Fluency (0/1) 1  Abstraction (0/2) 2  Delayed Recall (0/5) 2  Orientation (0/6) 5  Total 20  Adjusted Score (based on education)     REVIEW OF SYSTEMS: Out of a complete 14 system review of symptoms, the patient complains only of the following symptoms, memory loss, chronic pain, depression, insomnia, skin wounds and all other reviewed systems are  negative.   ALLERGIES: Allergies  Allergen Reactions   Ace Inhibitors Swelling   Tizanidine    Metformin And Related Other (See Comments)    CHILLS   Propofol Itching     HOME MEDICATIONS: Outpatient Medications Prior to Visit  Medication Sig Dispense Refill   ACCU-CHEK FASTCLIX LANCETS MISC 1 each. Check blood sugar once daily     ACCU-CHEK SMARTVIEW test strip 1 each by Other route 2 (two) times daily. And lancets 2/day 200 each 3   acetaminophen (TYLENOL) 500 MG tablet Take 500 mg by mouth daily as needed for moderate pain.     allopurinol (ZYLOPRIM) 300 MG tablet Take 300 mg by mouth daily.     amoxicillin-clavulanate (AUGMENTIN) 875-125 MG tablet Take 1 tablet by mouth every 12 (twelve) hours. 14 tablet 0   atenolol (TENORMIN) 25 MG tablet Take 25-50 mg by mouth See admin instructions. Take 50 mg every morning and then 25 mg every evening     Blood Glucose Monitoring Suppl (ACCU-CHEK NANO SMARTVIEW) w/Device KIT 1 each See admin instructions.  0   furosemide (LASIX) 20 MG tablet Take 1 tablet (20 mg total) by mouth daily. (Patient taking differently: Take 40 mg by mouth daily.) 30 tablet 0   gabapentin (NEURONTIN) 300 MG capsule Take 2 capsules (600 mg total) by mouth 2 (two) times daily.     hydrocerin (EUCERIN) CREA Apply 1 application topically daily. (Patient taking differently: Apply 1 application topically daily as needed (dry skin).) 228 g 0   insulin glargine (LANTUS) 100 UNIT/ML Solostar Pen Inject 70 Units into the skin every morning. And pen needles 1/day 75 mL 3   Insulin Pen Needle (PENTIPS) 32G X 4 MM MISC 1 each by Does not apply route daily. 30 each 0   NOVOFINE 32G X 6 MM MISC 1 each as needed.     polyethylene glycol (MIRALAX / GLYCOLAX) packet Take 17 g by mouth daily as needed for moderate constipation.     potassium chloride (K-DUR) 10 MEQ tablet Take 1 tablet (10 mEq total) by mouth daily. 30 tablet 0   protein supplement shake (PREMIER PROTEIN) LIQD Take  325 mLs (11 oz total) by mouth daily.  0   No facility-administered medications prior to visit.     PAST MEDICAL HISTORY: Past Medical History:  Diagnosis Date   Anxiety    Arthritis    "back, arms, legs" (03/24/2016)   Asthma    Chronic lower back pain    Colonic polyp    last colonoscopy done in 2009 with normal results per medical record   Depressive disorder    Gastric polyp    Gout  has taken allopurinol 318m once daily in past   Headache    History of hiatal hernia    Hypertension    Migraine    "none in awhile; might have a couple/year" (03/24/2016)   Mixed hyperlipidemia    01/2016 Total chol 141, HDL 59, LDL 63, ration 1.1   Osteoarthritis    TIA (transient ischemic attack) 11/2014   [place] II diabetes mellitus ([place]      PAST SURGICAL HISTORY: Past Surgical History:  Procedure Laterality Date   ARTERY BIOPSY Right 08/12/2016   Procedure: BIOPSY TEMPORAL ARTERY;  Surgeon: CElam [name] MD;  Location: [facility] OR;  Service: Vascular;  Laterality: Right;  BIOPSY TEMPORAL ARTERY   BREAST BIOPSY Left ~ 2015   benign   CARPAL TUNNEL RELEASE Bilateral    DILATION AND CURETTAGE OF UTERUS     [place]    FAMILY HISTORY: Family History  Problem Relation Age of Onset   Stroke Father    Stroke Brother    Cancer Other    Gout Mother    Hypertension Mother    Arthritis Mother    Asthma Mother    Stroke Brother      SOCIAL HISTORY: Social History   Socioeconomic History   Marital status: Married    Spouse name: Not on file   Number of children: Not on file   Years of education: 12+   Highest education level: Not on file  Occupational History   Not on file  Tobacco Use   Smoking status: Never   Smokeless tobacco: Never  Vaping Use   Vaping Use: Never used  Substance and Sexual Activity   Alcohol use: No   Drug use: No   Sexual activity:  Never  Other Topics Concern   Not on file  Social History Narrative   Right handed   Caffeine use: none   Lives with husband   [place]  Food Insecurity: Not on file  Transportation Needs: Not on file  Physical Activity: Not on file  Stress: Not on file  Social Connections: Not on file  Intimate Partner Violence: Not on file     PHYSICAL EXAM  There were no vitals filed for this visit.  There is no height or weight on file to calculate BMI.  Generalized: Well developed, in no acute distress  Cardiology: normal rate and rhythm, no murmur auscultated  Respiratory: clear to auscultation bilaterally    Neurological examination  Mentation: Alert oriented to time, place, history taking. Follows all commands speech and language fluent Cranial nerve II-XII: Pupils were equal round reactive to light. Extraocular movements were full, visual field were full on confrontational test. Facial sensation and strength were normal. Uvula tongue midline. Head turning and shoulder shrug  were normal and symmetric. Motor: The motor testing reveals 5 over 5 strength of all bilateral upper extremities. Good symmetric motor tone is noted throughout.  Gait and station: Gait not assessed, wheelchair bound   DIAGNOSTIC DATA (LABS, IMAGING, TESTING) - I reviewed patient records, labs, notes, testing and imaging myself where available.  Lab Results  Component Value Date   WBC 8.4 10/15/2021   HGB 11.9 (L) 10/15/2021   HCT 38.4 10/15/2021   MCV 87.9 10/15/2021  PLT 405 (H) 10/15/2021      Component Value Date/Time   NA 138 10/15/2021 0824   K 3.9 10/15/2021 0824   CL 105 10/15/2021 0824   CO2 24 10/15/2021 0824   GLUCOSE 280 (H) 10/15/2021 0824   BUN 8 10/15/2021 0824   CREATININE 0.96 10/15/2021 0824   CREATININE 0.88 01/25/2016 0001   CALCIUM 9.5 10/15/2021 0824   PROT 8.9 (H)  10/14/2021 2030   ALBUMIN 2.9 (L) 10/14/2021 2030   AST 10 (L) 10/14/2021 2030   ALT 9 10/14/2021 2030   ALKPHOS 93 10/14/2021 2030   BILITOT 0.3 10/14/2021 2030   GFRNONAA >60 10/15/2021 0824   GFRAA >60 11/18/2019 0251   Lab Results  Component Value Date   CHOL 100 06/22/2019   HDL 39 (L) 06/22/2019   LDLCALC 48 06/22/2019   TRIG 64 06/22/2019   CHOLHDL 2.6 06/22/2019   Lab Results  Component Value Date   HGBA1C 11.5 (A) 05/21/2021   Lab Results  Component Value Date   VITAMINB12 342 02/06/2021   Lab Results  Component Value Date   TSH 1.790 02/27/2020        No data to display              06/26/2021    8:42 AM 02/27/2020   10:33 AM  Montreal Cognitive Assessment   Visuospatial/ Executive (0/5) 3 3  Naming (0/3) 3 3  Attention: Read list of digits (0/2) 2 1  Attention: Read list of letters (0/1) 1 1  Attention: Serial 7 subtraction starting at 100 (0/3) 2 1  Language: Repeat phrase (0/2) 2 1  Language : Fluency (0/1) 1 1  Abstraction (0/2) 2 2  Delayed Recall (0/5) 0 2  Orientation (0/6) 5 5  Total 21 20  Adjusted Score (based on education)  20     ASSESSMENT AND PLAN  71 y.o. year old female  has a past medical history of Anxiety, Arthritis, Asthma, Chronic lower back pain, Colonic polyp, Depressive disorder, Gastric polyp, Gout, Headache, History of hiatal hernia, Hypertension, Migraine, Mixed hyperlipidemia, Osteoarthritis, TIA (transient ischemic attack) (11/2014), and Type II diabetes mellitus ([place]). here with    No diagnosis found.  Brilynn has noted continued short term memory loss. I am concerned that her depression and physical comorbidies contribute. She reports being on multiple antidepressants in the past. I feel she would benefit most from a referral to a mental health specialist. I feel psychiatry and psychology input would be most helpful for her. I have offered to send her for formal neurocognitive testing, however, we will start with  mood management at this time. Will consider neurocognitive testing in the future. She was encouraged to follow up closely with PCP for comorbidity management. Memory compensation strategies reviewed. Healthy lifestyle habits encouraged.    No orders of the defined types were placed in this encounter.    No orders of the defined types were placed in this encounter.    Debbora Presto, MSN, FNP-C 04/01/2022, 9:45 AM  [facility] [address] [place], [place] [address] [phone]

## 2022-04-01 NOTE — Patient Instructions (Signed)
Below is our plan:  We will resend referral to psychiatry. Please listen for a call from psychiatry to schedule an appt. If you do not hear back in 1 week, please let me know.   Please make sure you are staying well hydrated. I recommend 50-60 ounces daily. Well balanced diet and regular exercise encouraged. Consistent sleep schedule with 6-8 hours recommended.   Please continue follow up with care team as directed.   Follow up with me pending visit with psychiatry.   You may receive a survey regarding today's visit. I encourage you to leave honest feed back as I do use this information to improve patient care. Thank you for seeing me today!   Management of Memory Problems   There are some general things you can do to help manage your memory problems.  Your memory may not in fact recover, but by using techniques and strategies you will be able to manage your memory difficulties better.   1)  Establish a routine. Try to establish and then stick to a regular routine.  By doing this, you will get used to what to expect and you will reduce the need to rely on your memory.  Also, try to do things at the same time of day, such as taking your medication or checking your calendar first thing in the morning. Think about think that you can do as a part of a regular routine and make a list.  Then enter them into a daily planner to remind you.  This will help you establish a routine.   2)  Organize your environment. Organize your environment so that it is uncluttered.  Decrease visual stimulation.  Place everyday items such as keys or cell phone in the same place every day (ie.  Basket next to front door) Use post it notes with a brief message to yourself (ie. Turn off light, lock the door) Use labels to indicate where things go (ie. Which cupboards are for food, dishes, etc.) Keep a notepad and pen by the telephone to take messages   3)  Memory Aids A diary or journal/notebook/daily planner Making a  list (shopping list, chore list, to do list that needs to be done) Using an alarm as a reminder (kitchen timer or cell phone alarm) Using cell phone to store information (Notes, Calendar, Reminders) Calendar/White board placed in a prominent position Post-it notes   In order for memory aids to be useful, you need to have good habits.  It's no good remembering to make a note in your journal if you don't remember to look in it.  Try setting aside a certain time of day to look in journal.   4)  Improving mood and managing fatigue. There may be other factors that contribute to memory difficulties.  Factors, such as anxiety, depression and tiredness can affect memory. Regular gentle exercise can help improve your mood and give you more energy. Exercise: there are short videos created by the [employer] on Health specially for older adults: https://bit.ly/2I30q97.  Mediterranean diet: which emphasizes fruits, vegetables, whole grains, legumes, fish, and other seafood; unsaturated fats such as olive oils; and low amounts of red meat, eggs, and sweets. A variation of this, called MIND ([place] Intervention for Neurodegenerative Delay) incorporates the DASH (Dietary Approaches to Stop Hypertension) diet, which has been shown to lower high blood pressure, a risk factor for Alzheimer's disease. More information at: RepublicForum.gl.  Aerobic exercise that improve heart health is also good for the mind.  [employer] on Aging have short videos for exercises that you can do at home: GoldCloset.com.ee Simple relaxation techniques may help relieve symptoms of anxiety Try to get back to completing activities or hobbies you enjoyed doing in the past. Learn to pace yourself through activities to decrease fatigue. Find out about some local support groups where you can share experiences with others. Try and  achieve 7-8 hours of sleep at night.

## 2022-04-02 ENCOUNTER — Ambulatory Visit: Payer: Medicare Other | Admitting: Family Medicine

## 2022-04-02 ENCOUNTER — Encounter: Payer: Self-pay | Admitting: Family Medicine

## 2022-04-02 VITALS — BP 155/78 | HR 73 | Ht 65.0 in

## 2022-04-02 DIAGNOSIS — G4700 Insomnia, unspecified: Secondary | ICD-10-CM | POA: Diagnosis not present

## 2022-04-02 DIAGNOSIS — R413 Other amnesia: Secondary | ICD-10-CM | POA: Diagnosis not present

## 2022-04-02 DIAGNOSIS — F329 Major depressive disorder, single episode, unspecified: Secondary | ICD-10-CM

## 2022-04-14 ENCOUNTER — Telehealth: Payer: Self-pay | Admitting: Family Medicine

## 2022-04-14 NOTE — Telephone Encounter (Signed)
It looks like referral should have been sent through [facility], I will send it over in a fax to make sure [facility] receives it. Their phone # is [phone].

## 2022-04-14 NOTE — Telephone Encounter (Signed)
Spouse is asking for a call re: the status of the psychiatrist referral

## 2022-06-02 ENCOUNTER — Encounter (HOSPITAL_COMMUNITY): Payer: Self-pay | Admitting: Student in an Organized Health Care Education/Training Program

## 2022-06-02 ENCOUNTER — Ambulatory Visit (HOSPITAL_BASED_OUTPATIENT_CLINIC_OR_DEPARTMENT_OTHER): Payer: Medicare Other | Admitting: Student in an Organized Health Care Education/Training Program

## 2022-06-02 ENCOUNTER — Telehealth: Payer: Self-pay | Admitting: Family Medicine

## 2022-06-02 VITALS — BP 167/86 | HR 113 | Resp 20

## 2022-06-02 DIAGNOSIS — F5089 Other specified eating disorder: Secondary | ICD-10-CM

## 2022-06-02 DIAGNOSIS — F314 Bipolar disorder, current episode depressed, severe, without psychotic features: Secondary | ICD-10-CM

## 2022-06-02 DIAGNOSIS — G4700 Insomnia, unspecified: Secondary | ICD-10-CM | POA: Diagnosis not present

## 2022-06-02 DIAGNOSIS — F4312 Post-traumatic stress disorder, chronic: Secondary | ICD-10-CM | POA: Diagnosis not present

## 2022-06-02 MED ORDER — LURASIDONE HCL 20 MG PO TABS: 20.0000 mg | ORAL_TABLET | Freq: Every day | ORAL | 0 refills | Status: DC

## 2022-06-02 MED ORDER — TRAZODONE HCL 50 MG PO TABS: 50.0000 mg | ORAL_TABLET | Freq: Every evening | ORAL | 1 refills | Status: DC | PRN

## 2022-06-02 MED ORDER — LURASIDONE HCL 40 MG PO TABS: 40.0000 mg | ORAL_TABLET | Freq: Every day | ORAL | 1 refills | Status: DC
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
## 2022-06-02 NOTE — Patient Instructions (Signed)
Medication Instructions  START Latuda 20mg  with breakfast for 2 weeks Followed by:  40mg  w/ breakfast   2. Take Trazodone 50mg -100mg  nightly for sleep

## 2022-06-11 ENCOUNTER — Emergency Department (HOSPITAL_COMMUNITY): Payer: Medicare Other

## 2022-06-11 ENCOUNTER — Inpatient Hospital Stay (HOSPITAL_COMMUNITY)
Admission: EM | Admit: 2022-06-11 | Discharge: 2022-06-16 | DRG: 023 | Disposition: A | Discharge status: Home Health | Payer: Medicare Other | Attending: Internal Medicine | Admitting: Internal Medicine

## 2022-06-11 ENCOUNTER — Emergency Department (HOSPITAL_COMMUNITY): Payer: Medicare Other | Admitting: Anesthesiology

## 2022-06-11 ENCOUNTER — Encounter (HOSPITAL_COMMUNITY)
Admission: EM | Disposition: A | Discharge status: Home Health | Payer: Self-pay | Source: Home / Self Care | Attending: Neurology

## 2022-06-11 ENCOUNTER — Inpatient Hospital Stay (HOSPITAL_COMMUNITY): Payer: Medicare Other

## 2022-06-11 ENCOUNTER — Encounter (HOSPITAL_COMMUNITY): Payer: Self-pay | Admitting: Student in an Organized Health Care Education/Training Program

## 2022-06-11 DIAGNOSIS — R471 Dysarthria and anarthria: Secondary | ICD-10-CM | POA: Diagnosis present

## 2022-06-11 DIAGNOSIS — L03116 Cellulitis of left lower limb: Secondary | ICD-10-CM | POA: Diagnosis present

## 2022-06-11 DIAGNOSIS — G8104 Flaccid hemiplegia affecting left nondominant side: Secondary | ICD-10-CM | POA: Diagnosis present

## 2022-06-11 DIAGNOSIS — I6601 Occlusion and stenosis of right middle cerebral artery: Secondary | ICD-10-CM | POA: Diagnosis present

## 2022-06-11 DIAGNOSIS — L03115 Cellulitis of right lower limb: Secondary | ICD-10-CM | POA: Diagnosis present

## 2022-06-11 DIAGNOSIS — E876 Hypokalemia: Secondary | ICD-10-CM | POA: Diagnosis present

## 2022-06-11 DIAGNOSIS — R29898 Other symptoms and signs involving the musculoskeletal system: Secondary | ICD-10-CM | POA: Diagnosis present

## 2022-06-11 DIAGNOSIS — I10 Essential (primary) hypertension: Secondary | ICD-10-CM | POA: Diagnosis present

## 2022-06-11 DIAGNOSIS — I63411 Cerebral infarction due to embolism of right middle cerebral artery: Principal | ICD-10-CM | POA: Diagnosis present

## 2022-06-11 DIAGNOSIS — Z79899 Other long term (current) drug therapy: Secondary | ICD-10-CM

## 2022-06-11 DIAGNOSIS — E1151 Type 2 diabetes mellitus with diabetic peripheral angiopathy without gangrene: Secondary | ICD-10-CM

## 2022-06-11 DIAGNOSIS — E119 Type 2 diabetes mellitus without complications: Secondary | ICD-10-CM

## 2022-06-11 DIAGNOSIS — M109 Gout, unspecified: Secondary | ICD-10-CM | POA: Diagnosis present

## 2022-06-11 DIAGNOSIS — Z794 Long term (current) use of insulin: Secondary | ICD-10-CM

## 2022-06-11 DIAGNOSIS — R29718 NIHSS score 18: Secondary | ICD-10-CM | POA: Diagnosis present

## 2022-06-11 DIAGNOSIS — I878 Other specified disorders of veins: Secondary | ICD-10-CM | POA: Diagnosis present

## 2022-06-11 DIAGNOSIS — E669 Obesity, unspecified: Secondary | ICD-10-CM | POA: Diagnosis present

## 2022-06-11 DIAGNOSIS — E785 Hyperlipidemia, unspecified: Secondary | ICD-10-CM | POA: Diagnosis present

## 2022-06-11 DIAGNOSIS — Z79891 Long term (current) use of opiate analgesic: Secondary | ICD-10-CM

## 2022-06-11 DIAGNOSIS — E782 Mixed hyperlipidemia: Secondary | ICD-10-CM | POA: Diagnosis present

## 2022-06-11 DIAGNOSIS — I611 Nontraumatic intracerebral hemorrhage in hemisphere, cortical: Secondary | ICD-10-CM | POA: Diagnosis present

## 2022-06-11 DIAGNOSIS — Z8261 Family history of arthritis: Secondary | ICD-10-CM

## 2022-06-11 DIAGNOSIS — I890 Lymphedema, not elsewhere classified: Secondary | ICD-10-CM | POA: Diagnosis present

## 2022-06-11 DIAGNOSIS — L97519 Non-pressure chronic ulcer of other part of right foot with unspecified severity: Secondary | ICD-10-CM | POA: Diagnosis present

## 2022-06-11 DIAGNOSIS — Z6841 Body Mass Index (BMI) 40.0 and over, adult: Secondary | ICD-10-CM | POA: Diagnosis not present

## 2022-06-11 DIAGNOSIS — R414 Neurologic neglect syndrome: Secondary | ICD-10-CM | POA: Diagnosis present

## 2022-06-11 DIAGNOSIS — E1165 Type 2 diabetes mellitus with hyperglycemia: Secondary | ICD-10-CM | POA: Diagnosis present

## 2022-06-11 DIAGNOSIS — I69391 Dysphagia following cerebral infarction: Secondary | ICD-10-CM

## 2022-06-11 DIAGNOSIS — J9601 Acute respiratory failure with hypoxia: Secondary | ICD-10-CM | POA: Diagnosis present

## 2022-06-11 DIAGNOSIS — I63511 Cerebral infarction due to unspecified occlusion or stenosis of right middle cerebral artery: Secondary | ICD-10-CM | POA: Diagnosis not present

## 2022-06-11 DIAGNOSIS — Z8616 Personal history of COVID-19: Secondary | ICD-10-CM | POA: Diagnosis not present

## 2022-06-11 DIAGNOSIS — R1310 Dysphagia, unspecified: Secondary | ICD-10-CM | POA: Diagnosis present

## 2022-06-11 DIAGNOSIS — Z1152 Encounter for screening for COVID-19: Secondary | ICD-10-CM

## 2022-06-11 DIAGNOSIS — I161 Hypertensive emergency: Secondary | ICD-10-CM | POA: Diagnosis not present

## 2022-06-11 DIAGNOSIS — E11621 Type 2 diabetes mellitus with foot ulcer: Secondary | ICD-10-CM | POA: Diagnosis present

## 2022-06-11 DIAGNOSIS — E11622 Type 2 diabetes mellitus with other skin ulcer: Secondary | ICD-10-CM | POA: Diagnosis present

## 2022-06-11 DIAGNOSIS — E11649 Type 2 diabetes mellitus with hypoglycemia without coma: Secondary | ICD-10-CM | POA: Diagnosis not present

## 2022-06-11 DIAGNOSIS — Z888 Allergy status to other drugs, medicaments and biological substances status: Secondary | ICD-10-CM

## 2022-06-11 DIAGNOSIS — I872 Venous insufficiency (chronic) (peripheral): Secondary | ICD-10-CM | POA: Diagnosis present

## 2022-06-11 DIAGNOSIS — I639 Cerebral infarction, unspecified: Secondary | ICD-10-CM | POA: Diagnosis present

## 2022-06-11 DIAGNOSIS — M1990 Unspecified osteoarthritis, unspecified site: Secondary | ICD-10-CM | POA: Diagnosis present

## 2022-06-11 DIAGNOSIS — Z823 Family history of stroke: Secondary | ICD-10-CM

## 2022-06-11 DIAGNOSIS — F418 Other specified anxiety disorders: Secondary | ICD-10-CM | POA: Diagnosis not present

## 2022-06-11 DIAGNOSIS — L97529 Non-pressure chronic ulcer of other part of left foot with unspecified severity: Secondary | ICD-10-CM | POA: Diagnosis present

## 2022-06-11 DIAGNOSIS — X58XXXA Exposure to other specified factors, initial encounter: Secondary | ICD-10-CM | POA: Diagnosis present

## 2022-06-11 DIAGNOSIS — F319 Bipolar disorder, unspecified: Secondary | ICD-10-CM | POA: Diagnosis present

## 2022-06-11 DIAGNOSIS — R739 Hyperglycemia, unspecified: Secondary | ICD-10-CM | POA: Diagnosis not present

## 2022-06-11 DIAGNOSIS — M545 Low back pain, unspecified: Secondary | ICD-10-CM | POA: Diagnosis present

## 2022-06-11 DIAGNOSIS — R5381 Other malaise: Secondary | ICD-10-CM | POA: Diagnosis present

## 2022-06-11 DIAGNOSIS — G8929 Other chronic pain: Secondary | ICD-10-CM | POA: Diagnosis present

## 2022-06-11 DIAGNOSIS — F419 Anxiety disorder, unspecified: Secondary | ICD-10-CM | POA: Diagnosis present

## 2022-06-11 DIAGNOSIS — T8089XA Other complications following infusion, transfusion and therapeutic injection, initial encounter: Secondary | ICD-10-CM | POA: Diagnosis not present

## 2022-06-11 DIAGNOSIS — Z713 Dietary counseling and surveillance: Secondary | ICD-10-CM

## 2022-06-11 DIAGNOSIS — R29810 Facial weakness: Secondary | ICD-10-CM | POA: Diagnosis present

## 2022-06-11 DIAGNOSIS — Z825 Family history of asthma and other chronic lower respiratory diseases: Secondary | ICD-10-CM

## 2022-06-11 DIAGNOSIS — Z8249 Family history of ischemic heart disease and other diseases of the circulatory system: Secondary | ICD-10-CM

## 2022-06-11 DIAGNOSIS — D72829 Elevated white blood cell count, unspecified: Secondary | ICD-10-CM | POA: Diagnosis present

## 2022-06-11 HISTORY — PX: RADIOLOGY WITH ANESTHESIA: SHX6223

## 2022-06-11 HISTORY — PX: IR US GUIDE VASC ACCESS RIGHT: IMG2390

## 2022-06-11 HISTORY — PX: IR CT HEAD LTD: IMG2386

## 2022-06-11 HISTORY — PX: IR PERCUTANEOUS ART THROMBECTOMY/INFUSION INTRACRANIAL INC DIAG ANGIO: IMG6087

## 2022-06-11 LAB — HEMOGLOBIN A1C
Hgb A1c MFr Bld: 9.5 % — ABNORMAL HIGH (ref 4.8–5.6)
Mean Plasma Glucose: 226 mg/dL

## 2022-06-11 LAB — COMPREHENSIVE METABOLIC PANEL
ALT: 159 U/L — ABNORMAL HIGH (ref 0–44)
AST: 28 U/L (ref 15–41)
Albumin: 2.6 g/dL — ABNORMAL LOW (ref 3.5–5.0)
Alkaline Phosphatase: 128 U/L — ABNORMAL HIGH (ref 38–126)
Anion gap: 14 (ref 5–15)
BUN: 8 mg/dL (ref 8–23)
CO2: 17 mmol/L — ABNORMAL LOW (ref 22–32)
Calcium: 8.7 mg/dL — ABNORMAL LOW (ref 8.9–10.3)
Chloride: 102 mmol/L (ref 98–111)
Creatinine, Ser: 0.95 mg/dL (ref 0.44–1.00)
GFR, Estimated: >60 mL/min (ref >60)
Glucose, Bld: 367 mg/dL — ABNORMAL HIGH (ref 70–99)
Potassium: 3.9 mmol/L (ref 3.5–5.1)
Sodium: 133 mmol/L — ABNORMAL LOW (ref 135–145)
Total Bilirubin: 0.6 mg/dL (ref 0.3–1.2)
Total Protein: 8.5 g/dL — ABNORMAL HIGH (ref 6.5–8.1)

## 2022-06-11 LAB — I-STAT CHEM 8, ED
BUN: 9 mg/dL (ref 8–23)
Calcium, Ion: 1.23 mmol/L (ref 1.15–1.40)
Chloride: 102 mmol/L (ref 98–111)
Creatinine, Ser: 0.6 mg/dL (ref 0.44–1.00)
Glucose, Bld: 375 mg/dL — ABNORMAL HIGH (ref 70–99)
HCT: 39 % (ref 36.0–46.0)
Hemoglobin: 13.3 g/dL (ref 12.0–15.0)
Potassium: 4 mmol/L (ref 3.5–5.1)
Sodium: 136 mmol/L (ref 135–145)
TCO2: 22 mmol/L (ref 22–32)

## 2022-06-11 LAB — RESP PANEL BY RT-PCR (RSV, FLU A&B, COVID)  RVPGX2
Influenza A by PCR: NEGATIVE
Influenza B by PCR: NEGATIVE
Resp Syncytial Virus by PCR: NEGATIVE
SARS Coronavirus 2 by RT PCR: NEGATIVE

## 2022-06-11 LAB — GLUCOSE, CAPILLARY
Glucose-Capillary: 357 mg/dL — ABNORMAL HIGH (ref 70–99)
Glucose-Capillary: 453 mg/dL — ABNORMAL HIGH (ref 70–99)
Glucose-Capillary: 359 mg/dL — ABNORMAL HIGH (ref 70–99)
Glucose-Capillary: 337 mg/dL — ABNORMAL HIGH (ref 70–99)
Glucose-Capillary: 348 mg/dL — ABNORMAL HIGH (ref 70–99)

## 2022-06-11 LAB — ECHOCARDIOGRAM COMPLETE
AR max vel: 2.85 cm2
AV Area VTI: 2.75 cm2
AV Area mean vel: 2.73 cm2
AV Mean grad: 6 mmHg
AV Peak grad: 11.3 mmHg
Ao pk vel: 1.68 m/s
Area-P 1/2: 2.54 cm2
S' Lateral: 2.6 cm
Weight: 4529.13 oz

## 2022-06-11 LAB — DIFFERENTIAL
Abs Immature Granulocytes: 0.06 10*3/uL (ref 0.00–0.07)
Basophils Absolute: 0 10*3/uL (ref 0.0–0.1)
Basophils Relative: 0 %
Eosinophils Absolute: 0 10*3/uL (ref 0.0–0.5)
Eosinophils Relative: 0 %
Immature Granulocytes: 0 %
Lymphocytes Relative: 4 %
Lymphs Abs: 0.6 10*3/uL — ABNORMAL LOW (ref 0.7–4.0)
Monocytes Absolute: 0.7 10*3/uL (ref 0.1–1.0)
Monocytes Relative: 5 %
Neutro Abs: 13.5 10*3/uL — ABNORMAL HIGH (ref 1.7–7.7)
Neutrophils Relative %: 91 %

## 2022-06-11 LAB — PROTIME-INR
INR: 1.2 (ref 0.8–1.2)
Prothrombin Time: 15.1 seconds (ref 11.4–15.2)

## 2022-06-11 LAB — CBG MONITORING, ED: Glucose-Capillary: 325 mg/dL — ABNORMAL HIGH (ref 70–99)

## 2022-06-11 LAB — CBC
HCT: 37.5 % (ref 36.0–46.0)
Hemoglobin: 11.1 g/dL — ABNORMAL LOW (ref 12.0–15.0)
MCH: 25.6 pg — ABNORMAL LOW (ref 26.0–34.0)
MCHC: 29.6 g/dL — ABNORMAL LOW (ref 30.0–36.0)
MCV: 86.6 fL (ref 80.0–100.0)
Platelets: 324 10*3/uL (ref 150–400)
RBC: 4.33 MIL/uL (ref 3.87–5.11)
RDW: 16.7 % — ABNORMAL HIGH (ref 11.5–15.5)
WBC: 14.8 10*3/uL — ABNORMAL HIGH (ref 4.0–10.5)
nRBC: 0 % (ref 0.0–0.2)

## 2022-06-11 LAB — ETHANOL: Alcohol, Ethyl (B): <10 mg/dL (ref <10)

## 2022-06-11 LAB — APTT: aPTT: 31 seconds (ref 24–36)

## 2022-06-11 SURGERY — IR WITH ANESTHESIA
Anesthesia: General

## 2022-06-11 MED ORDER — ACETAMINOPHEN 650 MG RE SUPP: 650.0000 mg | RECTAL | Status: DC | PRN

## 2022-06-11 MED ORDER — ACETAMINOPHEN 160 MG/5ML PO SOLN: 650.0000 mg | ORAL | Status: DC | PRN

## 2022-06-11 MED ORDER — SENNOSIDES-DOCUSATE SODIUM 8.6-50 MG PO TABS: 1.0000 | ORAL_TABLET | Freq: Every evening | ORAL | Status: DC | PRN

## 2022-06-11 MED ORDER — CEFAZOLIN SODIUM-DEXTROSE 2-3 GM-%(50ML) IV SOLR
INTRAVENOUS | Status: DC | PRN
  Administered 2022-06-11: 2 g via INTRAVENOUS
  Administered 2022-06-11: 1 g via INTRAVENOUS

## 2022-06-11 MED ORDER — ACETAMINOPHEN 325 MG PO TABS: 650.0000 mg | ORAL_TABLET | ORAL | Status: DC | PRN

## 2022-06-11 MED ORDER — CLEVIDIPINE BUTYRATE 0.5 MG/ML IV EMUL
INTRAVENOUS | Status: AC
  Administered 2022-06-12: 30 mg/h via INTRAVENOUS
  Filled 2022-06-11: qty 50

## 2022-06-11 MED ORDER — SODIUM CHLORIDE 0.9% FLUSH: 3.0000 mL | Freq: Once | INTRAVENOUS | Status: DC

## 2022-06-11 MED ORDER — VERAPAMIL HCL 2.5 MG/ML IV SOLN
INTRAVENOUS | Status: AC
  Filled 2022-06-11: qty 2

## 2022-06-11 MED ORDER — CEFAZOLIN SODIUM-DEXTROSE 1-4 GM/50ML-% IV SOLN
1.0000 g | Freq: Once | INTRAVENOUS | Status: AC
  Administered 2022-06-11: 1 g via INTRAVENOUS
  Filled 2022-06-11 (×2): qty 50

## 2022-06-11 MED ORDER — ACETAMINOPHEN 325 MG PO TABS
650.0000 mg | ORAL_TABLET | ORAL | Status: DC | PRN
  Administered 2022-06-13 – 2022-06-15 (×3): 650 mg via ORAL
  Filled 2022-06-11 (×3): qty 2

## 2022-06-11 MED ORDER — SODIUM CHLORIDE 0.9 % IV SOLN: INTRAVENOUS | Status: DC | PRN

## 2022-06-11 MED ORDER — ROCURONIUM BROMIDE 10 MG/ML (PF) SYRINGE
PREFILLED_SYRINGE | INTRAVENOUS | Status: DC | PRN
  Administered 2022-06-11: 70 mg via INTRAVENOUS

## 2022-06-11 MED ORDER — ONDANSETRON HCL 4 MG/2ML IJ SOLN
INTRAMUSCULAR | Status: DC | PRN
  Administered 2022-06-11: 4 mg via INTRAVENOUS

## 2022-06-11 MED ORDER — SODIUM CHLORIDE 0.9 % IV SOLN: INTRAVENOUS | Status: DC

## 2022-06-11 MED ORDER — INSULIN ASPART 100 UNIT/ML IJ SOLN
0.0000 [IU] | INTRAMUSCULAR | Status: DC
  Administered 2022-06-11: 20 [IU] via SUBCUTANEOUS

## 2022-06-11 MED ORDER — IOHEXOL 300 MG/ML  SOLN
100.0000 mL | Freq: Once | INTRAMUSCULAR | Status: AC | PRN
  Administered 2022-06-11: 65 mL via INTRA_ARTERIAL

## 2022-06-11 MED ORDER — LIDOCAINE HCL 1 % IJ SOLN
INTRAMUSCULAR | Status: AC
  Filled 2022-06-11: qty 20

## 2022-06-11 MED ORDER — IOHEXOL 350 MG/ML SOLN
160.0000 mL | Freq: Once | INTRAVENOUS | Status: AC | PRN
  Administered 2022-06-11: 160 mL via INTRAVENOUS

## 2022-06-11 MED ORDER — IOHEXOL 300 MG/ML  SOLN
150.0000 mL | Freq: Once | INTRAMUSCULAR | Status: AC | PRN
  Administered 2022-06-11: 15 mL via INTRA_ARTERIAL

## 2022-06-11 MED ORDER — PHENYLEPHRINE HCL-NACL 20-0.9 MG/250ML-% IV SOLN
INTRAVENOUS | Status: DC | PRN
  Administered 2022-06-11: 25 ug/min via INTRAVENOUS

## 2022-06-11 MED ORDER — ENOXAPARIN SODIUM 40 MG/0.4ML IJ SOSY
40.0000 mg | PREFILLED_SYRINGE | Freq: Every day | INTRAMUSCULAR | Status: DC
  Administered 2022-06-12: 40 mg via SUBCUTANEOUS
  Filled 2022-06-11: qty 0.4

## 2022-06-11 MED ORDER — DEXTROSE 50 % IV SOLN: 0.0000 mL | INTRAVENOUS | Status: DC | PRN

## 2022-06-11 MED ORDER — STROKE: EARLY STAGES OF RECOVERY BOOK: Freq: Once | Status: AC

## 2022-06-11 MED ORDER — SUCCINYLCHOLINE CHLORIDE 200 MG/10ML IV SOSY
PREFILLED_SYRINGE | INTRAVENOUS | Status: DC | PRN
  Administered 2022-06-11: 140 mg via INTRAVENOUS

## 2022-06-11 MED ORDER — SUGAMMADEX SODIUM 200 MG/2ML IV SOLN
INTRAVENOUS | Status: DC | PRN
  Administered 2022-06-11: 200 mg via INTRAVENOUS

## 2022-06-11 MED ORDER — PROPOFOL 10 MG/ML IV BOLUS
INTRAVENOUS | Status: DC | PRN
  Administered 2022-06-11: 100 mg via INTRAVENOUS

## 2022-06-11 MED ORDER — DEXAMETHASONE SODIUM PHOSPHATE 10 MG/ML IJ SOLN
INTRAMUSCULAR | Status: DC | PRN
  Administered 2022-06-11: 10 mg via INTRAVENOUS

## 2022-06-11 MED ORDER — VERAPAMIL HCL 2.5 MG/ML IV SOLN: INTRA_ARTERIAL | Status: DC | PRN

## 2022-06-11 MED ORDER — FENTANYL CITRATE (PF) 100 MCG/2ML IJ SOLN
INTRAMUSCULAR | Status: AC
  Filled 2022-06-11: qty 2

## 2022-06-11 MED ORDER — HEPARIN SODIUM (PORCINE) 1000 UNIT/ML IJ SOLN
INTRAMUSCULAR | Status: AC
  Filled 2022-06-11: qty 10

## 2022-06-11 MED ORDER — FENTANYL CITRATE (PF) 100 MCG/2ML IJ SOLN
INTRAMUSCULAR | Status: DC | PRN
  Administered 2022-06-11 (×2): 25 ug via INTRAVENOUS
  Administered 2022-06-11: 50 ug via INTRAVENOUS

## 2022-06-11 MED ORDER — LIDOCAINE 2% (20 MG/ML) 5 ML SYRINGE
INTRAMUSCULAR | Status: DC | PRN
  Administered 2022-06-11: 80 mg via INTRAVENOUS

## 2022-06-11 MED ORDER — CEFAZOLIN SODIUM-DEXTROSE 2-4 GM/100ML-% IV SOLN
INTRAVENOUS | Status: AC
  Filled 2022-06-11: qty 100

## 2022-06-11 MED ORDER — MEDIHONEY WOUND/BURN DRESSING EX PSTE
1.0000 | PASTE | Freq: Every day | CUTANEOUS | Status: DC
  Administered 2022-06-11 – 2022-06-16 (×6): 1 via TOPICAL
  Filled 2022-06-11 (×3): qty 44

## 2022-06-11 MED ORDER — LABETALOL HCL 5 MG/ML IV SOLN: 10.0000 mg | INTRAVENOUS | Status: DC | PRN

## 2022-06-11 MED ORDER — SODIUM CHLORIDE (PF) 0.9 % IJ SOLN
INTRAVENOUS | Status: DC | PRN
  Administered 2022-06-11 (×4): 25 ug via INTRA_ARTERIAL

## 2022-06-11 MED ORDER — INSULIN REGULAR(HUMAN) IN NACL 100-0.9 UT/100ML-% IV SOLN
INTRAVENOUS | Status: DC
  Administered 2022-06-11: 15 [IU]/h via INTRAVENOUS
  Administered 2022-06-12: 10.5 [IU]/h via INTRAVENOUS
  Filled 2022-06-11 (×2): qty 100

## 2022-06-11 MED ORDER — CLEVIDIPINE BUTYRATE 0.5 MG/ML IV EMUL
0.0000 mg/h | INTRAVENOUS | Status: DC
  Administered 2022-06-11: 21 mg/h via INTRAVENOUS
  Administered 2022-06-11: 18 mg/h via INTRAVENOUS
  Administered 2022-06-11: 32 mg/h via INTRAVENOUS
  Administered 2022-06-11: 30 mg/h via INTRAVENOUS
  Administered 2022-06-11: 32 mg/h via INTRAVENOUS
  Administered 2022-06-12: 15 mg/h via INTRAVENOUS
  Administered 2022-06-12: 13 mg/h via INTRAVENOUS
  Administered 2022-06-12: 28 mg/h via INTRAVENOUS
  Administered 2022-06-12: 30 mg/h via INTRAVENOUS
  Administered 2022-06-12: 28 mg/h via INTRAVENOUS
  Administered 2022-06-12: 24 mg/h via INTRAVENOUS
  Administered 2022-06-12: 26 mg/h via INTRAVENOUS
  Filled 2022-06-11 (×4): qty 100
  Filled 2022-06-11: qty 50
  Filled 2022-06-11: qty 200
  Filled 2022-06-11 (×4): qty 100
  Filled 2022-06-11: qty 200

## 2022-06-11 MED ORDER — NITROGLYCERIN 1 MG/10 ML FOR IR/CATH LAB
INTRA_ARTERIAL | Status: AC
  Filled 2022-06-11: qty 10

## 2022-06-11 MED ORDER — CLEVIDIPINE BUTYRATE 0.5 MG/ML IV EMUL
INTRAVENOUS | Status: DC | PRN
  Administered 2022-06-11: 2 mg/h via INTRAVENOUS

## 2022-06-11 MED ORDER — LABETALOL HCL 5 MG/ML IV SOLN
INTRAVENOUS | Status: DC | PRN
  Administered 2022-06-11 (×4): 5 mg via INTRAVENOUS

## 2022-06-11 MED ORDER — NITROGLYCERIN 1 MG/10 ML FOR IR/CATH LAB: INTRA_ARTERIAL | Status: DC | PRN

## 2022-06-11 NOTE — Progress Notes (Signed)
  Echocardiogram 2D Echocardiogram has been performed.  [name] 06/11/2022, 4:41 PM

## 2022-06-11 NOTE — Progress Notes (Signed)
  Evaluation after Contrast Extravasation  Patient seen and examined immediately after contrast extravasation while in CT holding area. Patient admitted as code stroke and currently unable to provide any history. Per CT tech 60 mL Omnipaque in SQ of left upper extremity during CTA. Previous IV removed with new IV in right AC.  Exam: There is minimal swelling of the left upper extremity proximal to the [facility].  There is no erythema. There is no discoloration. There are no blisters. There are no signs of decreased perfusion of the skin.  It is  warm to touch.  The patient is unable to follow commands currently and as such cannot assess ROM Radial pulse normal.  Per contrast extravasation protocol: - Ice pack to area for 20-60 minutes x 48 hours while awake, after 48 hours may switch to heat for 20-60 minutes x additional 48 hours if discomfort and swelling persist - Keep arm elevated as much as possible - Monitor for change in sensation/ROM, decreased perfusion, decreased peripheral pulses, ulceration or blistering, increased swelling or redness. If any of the above are noted please call IR at [phone].     Villa Herb PA-C 06/11/2022 12:11 PM

## 2022-06-11 NOTE — Anesthesia Preprocedure Evaluation (Signed)
Anesthesia Evaluation  Patient identified by MRN, date of birth, ID band Patient confused    Reviewed: Allergy & Precautions, NPO status , Patient's Chart, lab work & pertinent test results, reviewed documented beta blocker date and time , Unable to perform ROS - Chart review onlyPreop documentation limited or incomplete due to emergent nature of procedure.  Airway Mallampati: III  TM Distance: >3 FB Neck ROM: Full    Dental  (+) Teeth Intact, Dental Advisory Given   Pulmonary asthma    Pulmonary exam normal breath sounds clear to auscultation       Cardiovascular hypertension, Pt. on home beta blockers and Pt. on medications + Peripheral Vascular Disease  Normal cardiovascular exam Rhythm:Regular Rate:Tachycardia     Neuro/Psych  Headaches PSYCHIATRIC DISORDERS Anxiety Depression  Schizophrenia  TIACVA, Residual Symptoms    GI/Hepatic hiatal hernia,,,  Endo/Other  diabetes, Insulin Dependent  Morbid obesity  Renal/GU      Musculoskeletal  (+) Arthritis ,    Abdominal   Peds  Hematology   Anesthesia Other Findings   Reproductive/Obstetrics                              Anesthesia Physical Anesthesia Plan  ASA: 4  Anesthesia Plan: General   Post-op Pain Management: Minimal or no pain anticipated   Induction: Intravenous  PONV Risk Score and Plan: 3 and Dexamethasone and Ondansetron  Airway Management Planned: Oral ETT  Additional Equipment: Arterial line  Intra-op Plan:   Post-operative Plan: Possible Post-op intubation/ventilation  Informed Consent: I have reviewed the patients History and Physical, chart, labs and discussed the procedure including the risks, benefits and alternatives for the proposed anesthesia with the patient or authorized representative who has indicated his/her understanding and acceptance.     Only emergency history available and History available from  chart only  Plan Discussed with: CRNA  Anesthesia Plan Comments: (Pre-op eval completed by chart review after induction of anesthesia due to emergent nature of the procedure.)         Anesthesia Quick Evaluation

## 2022-06-11 NOTE — Anesthesia Procedure Notes (Signed)
Arterial Line Insertion Start/End12/27/2023 12:19 PM, 06/11/2022 12:26 PM Performed by: Adria Dill, CRNA, CRNA  Preanesthetic checklist: patient identified, IV checked, site marked, risks and benefits discussed, surgical consent and monitors and equipment checked Patient sedated Left, radial was placed Catheter size: 20 G  Attempts: 1 Procedure performed without using ultrasound guided technique. Following insertion, Biopatch. Post procedure assessment: normal  Patient tolerated the procedure well with no immediate complications.

## 2022-06-11 NOTE — Consult Note (Signed)
WOC Nurse Consult Note: Reason for Consult: Patient here via EMS and being worked up as a code stroke.  She has deficits noted upon arrival to movement in Left arm or leg and left facial droop.  WOC team consulted as patient has chronic, nonhealing venous stasis wounds to bilateral lower legs.  This is not her critical need at the time, but I will implement guidance for nursing staff.  Last noted visit with vascular team was 04/2021 and Dr Randie Heinz felt the arterial flow was normal at that time to the legs.  She has a diagnosis of lymphedema and has sequential compression at home which she does not use. Her last visit to wound center at [facility] shows a shift to conservative/palliative wound care measures.  Severe debility, uncontrolled medical issues and inability to care for self are impacting any ability to improve the wounds.   Wound type: venous stasis, lymphedema Pressure Injury POA: NA Chronic wounds present to bilateral lower legs and feet.  Covered in adherent slough Wound bed: devitalized tissue Drainage (amount, consistency, odor) moderate serosanguinous with foul, pungent odor Periwound: Edema, erythema, tenderness Dressing procedure/placement/frequency: Once stable, bedside RN to cleanse bilateral legs with soap and water and pat dry. Apply Medihoney to open wounds.  Cover with gauze and ABD pad.  Wrap both legs with gauze from below toes to below knee and secure with ace wrap for modified light compression. Change daily.   Will not follow at this time.  Please re-consult if needed.  Mike Gip MSN, RN, FNP-BC CWON Wound, Ostomy, Continence Nurse [facility] [phone] Pager [phone]

## 2022-06-11 NOTE — Code Documentation (Signed)
Stroke Response Nurse Documentation Code Documentation  [name] is a 71 y.o. female arriving to [facility]  via [facility] on 06/11/2022 with past medical hx significant of hypertension, TIA 2016, diabetes, migraine, anxiety, depressive disorder, and mixed hyperlipidemia. On No antithrombotic.   Patient from home where she was LKW at midnight when she went to bed. EMS was called by family to assist with lifting her as she had slipped down the bed. She pivots from bed to wheelchair baseline. EMS activated code stroke when they noted left sided weakness and facial droop.  Stroke team at the bedside on patient arrival. Labs drawn and patient cleared for CT by EDP. Patient to CT with team. NIHSS 18, see documentation for details and code stroke times. Patient with right gaze preference , left hemianopia, left facial droop, left arm weakness, bilateral leg weakness, left decreased sensation, and Sensory  neglect on exam. The following imaging was completed:  CT Head, CTA, and CTP. Initial PIV infiltrated with IV contrast. PA assessed site (left [facility]) and will place order set for care. Patient is not a candidate for IV Thrombolytic due to out of window. Patient is a candidate for IR candidate. CTA "abrupt occlusion of a proximal M2 right middle cerebral artery vessel". Consent obtained and patient taken to IR suite. Unable to mark pedal pulse due to wounds on bilateral feet. Femoral pulse assessed by Dr. Corliss Skains.   Care Plan: admit to ICU post IR procedure and follow post IR orders per protocol.    Bedside handoff with IR and ICU RNs.    Ferman Hamming Stroke Response RN

## 2022-06-11 NOTE — Progress Notes (Signed)
Reached out to Wilford Corner MD, as pt's SBP 150-155; New orders received of increasing max of cleviprex to 32 mg/hr. If pt still needs more BP management call back for added PRNS.  Electronically signed: Jannifer Hick 06/11/2022

## 2022-06-11 NOTE — Anesthesia Procedure Notes (Signed)
Procedure Name: Intubation Date/Time: 06/11/2022 12:25 PM  Performed by: Kyung Rudd, CRNAPre-anesthesia Checklist: Patient identified, Emergency Drugs available, Suction available and Patient being monitored Patient Re-evaluated:Patient Re-evaluated prior to induction Oxygen Delivery Method: Circle System Utilized Preoxygenation: Pre-oxygenation with 100% oxygen Induction Type: IV induction and Rapid sequence Laryngoscope Size: Mac and 3 Grade View: Grade I Tube type: Oral Tube size: 7.0 mm Number of attempts: 1 Airway Equipment and Method: Stylet and Oral airway Placement Confirmation: ETT inserted through vocal cords under direct vision, positive ETCO2 and breath sounds checked- equal and bilateral Secured at: 21 cm Tube secured with: Tape Dental Injury: Teeth and Oropharynx as per pre-operative assessment

## 2022-06-11 NOTE — Progress Notes (Signed)
Deveshwar MD notified of sustaining SBP 140-150; Per MD, Okay with SBP in the 140's as long as it doesn't sustain above 150; if so call Neuro team and add BP management  Electronically signed: Jannifer Hick 06/11/2022

## 2022-06-11 NOTE — H&P (Signed)
Neurology H&P  Reason for Consult: Code Stroke Referring Physician: Schveing  CC: Left side weakness  History is obtained from:EMS, family   Jacquenette Shone (Son)- [phone]  HPI: [name] is a 71 y.o. female with a past medical history of DM2, TIA, osteoarthritis, HLD, migraine, HTN, GOUT, depression, asthma, arthritis, bipolar disorder, PVD of bilateral lower extremities presenting via EMS with left side weakness. EMS was initially dispatched for lift assist after she slid out of bed. They noted a right gaze preference, left side flaccid and activated code stroke. BP on arrival 160/70 and glucose 325. On exam she has a left field cut, right gaze preference, left facial droop, tongue deviation, left side neglect, and her left upper and lower extremities are flaccid. She was taken from the bridge to CT scan as she presented with a right MCA syndrome.    LKW: 2230 12/26 IV thrombolysis given?: no, outside of window EVT: Yes Premorbid modified Rankin scale (mRS): 3- due to leg wounds, but she is alone and able to care for herself during the day  ROS: Full ROS was performed and is negative except as noted in the HPI.   Past Medical History:  Diagnosis Date   Anxiety    Arthritis    "back, arms, legs" (03/24/2016)   Asthma    Chronic lower back pain    Colonic polyp    last colonoscopy done in 2009 with normal results per medical record   Depressive disorder    Gastric polyp    Gout    has taken allopurinol 345m once daily in past   Headache    History of hiatal hernia    Hypertension    Migraine    "none in awhile; might have a couple/year" (03/24/2016)   Mixed hyperlipidemia    01/2016 Total chol 141, HDL 59, LDL 63, ration 1.1   Osteoarthritis    TIA (transient ischemic attack) 11/2014   Type II diabetes mellitus (HShirley      Family History  Problem Relation Age of Onset   Stroke Father    Stroke Brother    Cancer Other    Gout Mother    Hypertension Mother     Arthritis Mother    Asthma Mother    Stroke Brother     Social History:   reports that she has never smoked. She has never used smokeless tobacco. She reports that she does not drink alcohol and does not use drugs.  Medications  Current Facility-Administered Medications:    [START ON 06/12/2022]  stroke: early stages of recovery book, , Does not apply, Once, SCharlean Merl DMaryland NP   0.9 %  sodium chloride infusion, , Intravenous, Continuous, Shafer, Devon, NP   acetaminophen (TYLENOL) tablet 650 mg, 650 mg, Oral, Q4H PRN **OR** acetaminophen (TYLENOL) 160 MG/5ML solution 650 mg, 650 mg, Per Tube, Q4H PRN **OR** acetaminophen (TYLENOL) suppository 650 mg, 650 mg, Rectal, Q4H PRN, SCharlean Merl Devon, NP   ceFAZolin (ANCEF) 2-4 GM/100ML-% IVPB, , , ,    ceFAZolin (ANCEF) IVPB 1 g/50 mL premix, 1 g, Intravenous, Once, Deveshwar, Sanjeev, MD   enoxaparin (LOVENOX) injection 40 mg, 40 mg, Subcutaneous, Daily, Shafer, Devon, NP   heparin sodium (porcine) 1000 UNIT/ML injection, , , ,    iohexol (OMNIPAQUE) 300 MG/ML solution 150 mL, 150 mL, Intra-arterial, Once PRN, Deveshwar, SWillaim Rayas MD   leptospermum manuka honey (MEDIHONEY) paste 1 Application, 1 Application, Topical, Daily, AAmie Portland MD   lidocaine (XYLOCAINE) 1 % (with pres) injection, , , ,  lidocaine (XYLOCAINE) 1 % (with pres) injection, , , ,    nitroGLYCERIN 100 mcg/mL intra-arterial injection, , , ,    nitroGLYCERIN 100 mcg/mL intra-arterial injection, , , ,    nitroGLYCERIN 25 mcg in sodium chloride (PF) 0.9 % 59.71 mL (25 mcg/mL) syringe, , Intra-arterial, PRN, Luanne Bras, MD, 25 mcg at 06/11/22 1334   Radial Cocktail (customizable), , Intra-arterial, PRN, Luanne Bras, MD, Given at 06/11/22 1238   senna-docusate (Senokot-S) tablet 1 tablet, 1 tablet, Oral, QHS PRN, Charlean Merl, Devon, NP   sodium chloride flush (NS) 0.9 % injection 3 mL, 3 mL, Intravenous, Once, Amie Portland, MD   verapamil (ISOPTIN) 2.5 MG/ML injection, ,  , ,   Current Outpatient Medications:    ACCU-CHEK FASTCLIX LANCETS MISC, 1 each. Check blood sugar once daily, Disp: , Rfl:    ACCU-CHEK SMARTVIEW test strip, 1 each by Other route 2 (two) times daily. And lancets 2/day, Disp: 200 each, Rfl: 3   acetaminophen (TYLENOL) 500 MG tablet, Take 500 mg by mouth daily as needed for moderate pain., Disp: , Rfl:    allopurinol (ZYLOPRIM) 300 MG tablet, Take 300 mg by mouth daily., Disp: , Rfl:    amoxicillin-clavulanate (AUGMENTIN) 875-125 MG tablet, Take 1 tablet by mouth every 12 (twelve) hours., Disp: 14 tablet, Rfl: 0   atenolol (TENORMIN) 25 MG tablet, Take 25-50 mg by mouth See admin instructions. Take 50 mg every morning and then 25 mg every evening, Disp: , Rfl:    Blood Glucose Monitoring Suppl (ACCU-CHEK NANO SMARTVIEW) w/Device KIT, 1 each See admin instructions., Disp: , Rfl: 0   furosemide (LASIX) 20 MG tablet, Take 1 tablet (20 mg total) by mouth daily. (Patient taking differently: Take 40 mg by mouth daily.), Disp: 30 tablet, Rfl: 0   gabapentin (NEURONTIN) 300 MG capsule, Take 2 capsules (600 mg total) by mouth 2 (two) times daily., Disp: , Rfl:    hydrocerin (EUCERIN) CREA, Apply 1 application topically daily. (Patient taking differently: Apply 1 application  topically daily as needed (dry skin).), Disp: 228 g, Rfl: 0   insulin glargine (LANTUS) 100 UNIT/ML Solostar Pen, Inject 70 Units into the skin every morning. And pen needles 1/day, Disp: 75 mL, Rfl: 3   Insulin Pen Needle (PENTIPS) 32G X 4 MM MISC, 1 each by Does not apply route daily., Disp: 30 each, Rfl: 0   lurasidone (LATUDA) 20 MG TABS tablet, Take 1 tablet (20 mg total) by mouth daily with breakfast for 14 days., Disp: 14 tablet, Rfl: 0   [START ON 06/16/2022] lurasidone (LATUDA) 40 MG TABS tablet, Take 1 tablet (40 mg total) by mouth daily with breakfast., Disp: 30 tablet, Rfl: 1   NOVOFINE 32G X 6 MM MISC, 1 each as needed., Disp: , Rfl:    polyethylene glycol (MIRALAX /  GLYCOLAX) packet, Take 17 g by mouth daily as needed for moderate constipation., Disp: , Rfl:    potassium chloride (K-DUR) 10 MEQ tablet, Take 1 tablet (10 mEq total) by mouth daily., Disp: 30 tablet, Rfl: 0   protein supplement shake (PREMIER PROTEIN) LIQD, Take 325 mLs (11 oz total) by mouth daily., Disp: , Rfl: 0   traZODone (DESYREL) 50 MG tablet, Take 1 tablet (50 mg total) by mouth at bedtime and may repeat dose one time if needed., Disp: 60 tablet, Rfl: 1  Facility-Administered Medications Ordered in Other Encounters:    0.9 %  sodium chloride infusion, , Intravenous, Continuous PRN, Kyung Rudd, CRNA, New Bag at  06/11/22 1215   ceFAZolin (ANCEF) IVPB 2 g/50 mL premix, , Intravenous, Anesthesia Intra-op, Kyung Rudd, CRNA, 1 g at 06/11/22 1258   dexamethasone (DECADRON) injection, , Intravenous, Anesthesia Intra-op, Kyung Rudd, CRNA, 10 mg at 06/11/22 1300   fentaNYL (SUBLIMAZE) injection, , Intravenous, Anesthesia Intra-op, Kyung Rudd, CRNA, 25 mcg at 06/11/22 1257   lidocaine 2% (20 mg/mL) 5 mL syringe, , Intravenous, Anesthesia Intra-op, Kyung Rudd, CRNA, 80 mg at 06/11/22 1222   phenylephrine (NEO-SYNEPHRINE) 58m/NS 2566mpremix infusion, , Intravenous, Continuous PRN, WaKyung RuddCRNA, Last Rate: 7.5 mL/hr at 06/11/22 1306, 10 mcg/min at 06/11/22 1306   propofol (DIPRIVAN) 10 mg/mL bolus/IV push, , Intravenous, Anesthesia Intra-op, WaKyung RuddCRNA, 100 mg at 06/11/22 1222   rocuronium bromide 10 mg/mL (PF) syringe, , Intravenous, Anesthesia Intra-op, WaKyung RuddCRNA, 70 mg at 06/11/22 1230   succinylcholine (ANECTINE) syringe, , Intravenous, Anesthesia Intra-op, WaKyung RuddCRNA, 140 mg at 06/11/22 1222   Exam: Current vital signs: BP (!) 177/92   Pulse (!) 118   Wt 128.4 kg   SpO2 98%   BMI 47.11 kg/m  Vital signs in last 24 hours: Pulse Rate:  [118] 118 (12/27 1150) BP: (177)/(92) 177/92 (12/27 1150) SpO2:  [98 %] 98 % (12/27 1237) Weight:   [128.4 kg] 128.4 kg (12/27 1100)  GENERAL: Awake, alert in NAD HEENT: - Normocephalic and atraumatic, dry mm, no LN++, no Thyromegally LUNGS - Clear to auscultation bilaterally with no wheezes CV - S1S2 RRR, no m/r/g, equal pulses bilaterally. ABDOMEN - Soft, nontender, nondistended with normoactive BS Ext: warm, open wounds, edema, and cellulitis in bilateral lower extremities  NEURO:  Mental Status: AA&Ox3 Language: speech is dysarthric.  Naming, repetition, fluency, and comprehension intact. Left side neglect Cranial Nerves: PERRL , left field cut, incomplete gaze to the left, left facial droop, facial sensation intact, hearing intact, tongue/uvula/soft palate midline, normal sternocleidomastoid and trapezius muscle strength. Tongue deviated to the right  Motor:  RUE 5/5 LUE 0/5 RLE 2/5 LLE 0/5 Tone: is normal and bulk is normal Sensation- Intact to light touch on the right, Neglect on the left with DDS Coordination: Tremor on the right with FNF, unable to complete on the left Gait- deferred  NIHSS 1a Level of Conscious.: 0 1b LOC Questions: 0 1c LOC Commands: 0 2 Best Gaze: 1 3 Visual: 1 4 Facial Palsy: 2 5a Motor Arm - left: 4 5b Motor Arm - Right: 0 6a Motor Leg - Left: 4 6b Motor Leg - Right: 3 7 Limb Ataxia: 0 8 Sensory: 2 9 Best Language: 0 10 Dysarthria: 0 11 Extinct. and Inatten.: 1 TOTAL: 18   Labs I have reviewed labs in epic and the results pertinent to this consultation are:  CBC    Component Value Date/Time   WBC 14.8 (H) 06/11/2022 1240   RBC 4.33 06/11/2022 1240   HGB 13.3 06/11/2022 1243   HCT 39.0 06/11/2022 1243   PLT 324 06/11/2022 1240   MCV 86.6 06/11/2022 1240   MCH 25.6 (L) 06/11/2022 1240   MCHC 29.6 (L) 06/11/2022 1240   RDW 16.7 (H) 06/11/2022 1240   LYMPHSABS 0.6 (L) 06/11/2022 1240   MONOABS 0.7 06/11/2022 1240   EOSABS 0.0 06/11/2022 1240   BASOSABS 0.0 06/11/2022 1240    CMP     Component Value Date/Time   NA 136  06/11/2022 1243   K 4.0 06/11/2022 1243   CL 102 06/11/2022 1243  CO2 24 10/15/2021 0824   GLUCOSE 375 (H) 06/11/2022 1243   BUN 9 06/11/2022 1243   CREATININE 0.60 06/11/2022 1243   CREATININE 0.88 01/25/2016 0001   CALCIUM 9.5 10/15/2021 0824   PROT 8.9 (H) 10/14/2021 2030   ALBUMIN 2.9 (L) 10/14/2021 2030   AST 10 (L) 10/14/2021 2030   ALT 9 10/14/2021 2030   ALKPHOS 93 10/14/2021 2030   BILITOT 0.3 10/14/2021 2030   GFRNONAA >60 10/15/2021 0824   GFRAA >60 11/18/2019 0251    Lipid Panel     Component Value Date/Time   CHOL 100 06/22/2019 1305   TRIG 64 06/22/2019 1305   HDL 39 (L) 06/22/2019 1305   CHOLHDL 2.6 06/22/2019 1305   VLDL 13 06/22/2019 1305   LDLCALC 48 06/22/2019 1305     Imaging I have reviewed the images obtained:  CT-head 1. Asymmetric hypoattenuation and loss of gray differentiation involving the right insula and overlying operculum, concerning for acute right MCA territory infarct.  2. ASPECTS is 7.  CTA neck: The common carotid, internal carotid and vertebral arteries are patent within the neck without stenosis or significant atherosclerotic disease. No evidence of dissection.   CTA head: Abrupt occlusion of a proximal M2 right middle cerebral artery vessel.   CT perfusion head: The perfusion software identifies a 26 mL core infarct within the right MCA vascular territory. The perfusion software identifies a 45 mL region of critically hypoperfused parenchyma within the right MCA vascular territory (utilizing the Tmax>6 seconds threshold). Reported mismatch volume: 19 mL.  Assessment:   Cerebral infarction due to embolism of right middle cerebral artery  71 y.o. female with a past medical history of DM2, TIA, osteoarthritis, HLD, migraine, HTN, GOUT, depression, asthma, arthritis, bipolar disorder, PVD of bilateral lower extremities presenting via EMS with left side weakness. EMS was initially dispatched for lift assist after she slid out of  bed. They noted a right gaze preference, left side flaccid and activated code stroke. BP on arrival 160/70 and glucose 325. She was emergently taken to IR for a mechanical thrombectomy of her right M2. She was outside of the window for thrombolytic therapy.  There was a delay due to inadequate IV access for CT angio head and neck with perfusion    Plan  CNS -Close neuro monitoring - HgbA1c, fasting lipid panel - MRI of the brain without contrast - Frequent neuro checks - Echocardiogram - Carotid dopplers - Prophylactic therapy-Antiplatelet med: Aspirin - dose 360m PO or 3064mPR - Risk factor modification - Telemetry monitoring - PT consult, OT consult, Speech consult  Dysarthria Dysphagia following cerebral infarction  -NPO until cleared by speech -ST -Advance diet as tolerated   RESP Acute Respiratory Failure  -vent management per ICU -wean when able  CV Essential (primary) hypertension -Aggressive BP control, goal SBP < 220 or per IR  -TTE  HEME -Monitor CBC -Transfuse for hgb < 7  ENDO Type 2 diabetes mellitus with hyperglycemia  -SSI -goal HgbA1c < 7%  GI/GU -Gentle hydration  Fluid/Electrolyte Disorders -Replete -Repeat labs -Trend   ID -CXR -NPO -Monitor  Open wounds of bilateral lower extremities -Wound care consult -Consider vascular consult  Nutrition E66.9 Obesity  -diet consult  Prophylaxis DVT:  SCDs GI: PPI Bowel: Docusate / Senna  Diet: NPO until cleared by speech  Code Status: Full Code - for procedure   -- Patient seen and examined by NP/APP with MD. MD to update note as needed.   DeJanine OresDNP, FNP-BC  [facility] Pager: [phone]   Attending addendum Patient seen and examined as an acute code stroke. Requesting provider Dr. Truett Mainland, [place] Patient with last known well of 20 to 30 hours on 06/10/2022 coming in with symptoms consistent with right MCA syndrome.  CT head with aspects 7.   Significant delay in obtaining IV access and obtaining CT angiography but there is a proximal right M2 occlusion.  While waiting for the imaging, based on clinical suspicion, concern for right MCA occlusion, case was discussed with the interventionalists who agreed to proceed once vascular imaging was available.  Vascular imaging had significant delays due to IV access issues.  Eventually showed proximal right M2 occlusion.  CT perfusion showed favorable profile although 19 cc only to save.  Given the fact that the patient, although disabled due to leg ulcers, is fairly independent otherwise.  Not treating her stroke would leave her completely paralyzed and dependent for 24/7 care.  Discussed alternatives risks and benefits for the endovascular procedure with the family as well as the interventionalist.  Family agreed to proceed.  She was taken in for emergent thrombectomy. Post thrombectomy ICU care and workup as above. Stroke team will follow.  Consent obtained from husband over the phon, with by RN. IR provider-first communication-11:29 AM.  -- Amie Portland, MD Neurologist [facility] Pager: [phone]   CRITICAL CARE ATTESTATION Performed by: Amie Portland, MD Total critical care time: 60 minutes Critical care time was exclusive of separately billable procedures and treating other patients and/or supervising APPs/Residents/Students Critical care was necessary to treat or prevent imminent or life-threatening deterioration. This patient is critically ill and at significant risk for neurological worsening and/or death and care requires constant monitoring. Critical care was time spent personally by me on the following activities: development of treatment plan with patient and/or surrogate as well as nursing, discussions with consultants, evaluation of patient's response to treatment, examination of patient, obtaining history from patient or surrogate, ordering and performing  treatments and interventions, ordering and review of laboratory studies, ordering and review of radiographic studies, pulse oximetry, re-evaluation of patient's condition, participation in multidisciplinary rounds and medical decision making of high complexity in the care of this patient.

## 2022-06-11 NOTE — ED Triage Notes (Signed)
Pt arrived to ED via EMS from home as a CODE STROKE LKW 2400. EMS initially called out to home for a fall and lifting assistance. EMS noted pt had no movement to L arm or leg and had L facial droop. EMS reported a gaze. Pt w/ BLE wounds. 18g L AC.  VS: cbg 333, RR 24, 98% RA, HR 118

## 2022-06-11 NOTE — Progress Notes (Signed)
Glucose 450s despite 20u insulin previously given. Will start endotool. Also is maxed on cleviprex, will add labetalol PRN as well.   Ritta Slot, MD [facility] [phone]  If 7pm- 7am, please page neurology on call as listed in AMION.

## 2022-06-11 NOTE — Transfer of Care (Signed)
Immediate Anesthesia Transfer of Care Note  Patient: [name]  Procedure(s) Performed: IR WITH ANESTHESIA  Patient Location: NICU  Anesthesia Type:General  Level of Consciousness: awake, alert , and oriented  Airway & Oxygen Therapy: Patient Spontanous Breathing and Patient connected to face mask oxygen  Post-op Assessment: Report given to RN and Post -op Vital signs reviewed and stable  Post vital signs: Reviewed and stable  Last Vitals:  Vitals Value Taken Time  BP    Temp    Pulse 84 06/11/22 1434  Resp 18 06/11/22 1434  SpO2 100 % 06/11/22 1434  Vitals shown include unvalidated device data.  Last Pain: There were no vitals filed for this visit.       Complications: No notable events documented.

## 2022-06-11 NOTE — Anesthesia Postprocedure Evaluation (Signed)
Anesthesia Post Note  Patient: [name]  Procedure(s) Performed: IR WITH ANESTHESIA     Patient location during evaluation: PACU Anesthesia Type: General Level of consciousness: awake Pain management: pain level controlled Vital Signs Assessment: post-procedure vital signs reviewed and stable Respiratory status: spontaneous breathing, nonlabored ventilation, respiratory function stable and patient connected to nasal cannula oxygen Cardiovascular status: blood pressure returned to baseline and stable Postop Assessment: no apparent nausea or vomiting Anesthetic complications: no   No notable events documented.  Last Vitals:  Vitals:   06/11/22 1630 06/11/22 1645  BP:    Pulse: 82 88  Resp: 17 14  SpO2: 98% 99%    Last Pain:  Vitals:   06/11/22 1600  PainSc: 0-No pain                 Collene Schlichter

## 2022-06-11 NOTE — Procedures (Signed)
INR.   Status post right common carotid arteriogram. Right radial approach. Status post endovascular revascularization of right middle cerebral artery ,codominant superior division with 2 passes with contact aspiration and a 4 mm x 40 mm retriever, and a 3 mm x 20 mm retriever, and 1 pass with contact aspiration with Vecta 46 achieving a TICI 3  revascularization. Post CT of the brain demonstrates minimal subarachnoid hyperattenuation in the distal sylvian fissure. A right wrist band  applied at the right radial puncture site.  Distal right radial pulse verified present.  Patient extubated. Moving right side spontaneously. Fatima Sanger MD

## 2022-06-12 ENCOUNTER — Encounter (HOSPITAL_COMMUNITY): Payer: Self-pay | Admitting: Radiology

## 2022-06-12 ENCOUNTER — Inpatient Hospital Stay (HOSPITAL_COMMUNITY): Payer: Medicare Other

## 2022-06-12 DIAGNOSIS — D72829 Elevated white blood cell count, unspecified: Secondary | ICD-10-CM

## 2022-06-12 DIAGNOSIS — E11649 Type 2 diabetes mellitus with hypoglycemia without coma: Secondary | ICD-10-CM

## 2022-06-12 DIAGNOSIS — R739 Hyperglycemia, unspecified: Secondary | ICD-10-CM

## 2022-06-12 DIAGNOSIS — I63511 Cerebral infarction due to unspecified occlusion or stenosis of right middle cerebral artery: Secondary | ICD-10-CM | POA: Diagnosis not present

## 2022-06-12 DIAGNOSIS — I639 Cerebral infarction, unspecified: Secondary | ICD-10-CM | POA: Diagnosis not present

## 2022-06-12 DIAGNOSIS — E876 Hypokalemia: Secondary | ICD-10-CM

## 2022-06-12 LAB — GLUCOSE, CAPILLARY
Glucose-Capillary: 106 mg/dL — ABNORMAL HIGH (ref 70–99)
Glucose-Capillary: 113 mg/dL — ABNORMAL HIGH (ref 70–99)
Glucose-Capillary: 115 mg/dL — ABNORMAL HIGH (ref 70–99)
Glucose-Capillary: 124 mg/dL — ABNORMAL HIGH (ref 70–99)
Glucose-Capillary: 138 mg/dL — ABNORMAL HIGH (ref 70–99)
Glucose-Capillary: 139 mg/dL — ABNORMAL HIGH (ref 70–99)
Glucose-Capillary: 141 mg/dL — ABNORMAL HIGH (ref 70–99)
Glucose-Capillary: 142 mg/dL — ABNORMAL HIGH (ref 70–99)
Glucose-Capillary: 142 mg/dL — ABNORMAL HIGH (ref 70–99)
Glucose-Capillary: 152 mg/dL — ABNORMAL HIGH (ref 70–99)
Glucose-Capillary: 180 mg/dL — ABNORMAL HIGH (ref 70–99)
Glucose-Capillary: 211 mg/dL — ABNORMAL HIGH (ref 70–99)
Glucose-Capillary: 268 mg/dL — ABNORMAL HIGH (ref 70–99)
Glucose-Capillary: 75 mg/dL (ref 70–99)

## 2022-06-12 LAB — CBC WITH DIFFERENTIAL/PLATELET
Abs Immature Granulocytes: 0.07 10*3/uL (ref 0.00–0.07)
Basophils Absolute: 0 10*3/uL (ref 0.0–0.1)
Basophils Relative: 0 %
Eosinophils Absolute: 0 10*3/uL (ref 0.0–0.5)
Eosinophils Relative: 0 %
HCT: 34.7 % — ABNORMAL LOW (ref 36.0–46.0)
Hemoglobin: 11.4 g/dL — ABNORMAL LOW (ref 12.0–15.0)
Immature Granulocytes: 0 %
Lymphocytes Relative: 5 %
Lymphs Abs: 0.9 10*3/uL (ref 0.7–4.0)
MCH: 27.4 pg (ref 26.0–34.0)
MCHC: 32.9 g/dL (ref 30.0–36.0)
MCV: 83.4 fL (ref 80.0–100.0)
Monocytes Absolute: 1.3 10*3/uL — ABNORMAL HIGH (ref 0.1–1.0)
Monocytes Relative: 7 %
Neutro Abs: 16.7 10*3/uL — ABNORMAL HIGH (ref 1.7–7.7)
Neutrophils Relative %: 88 %
Platelets: 334 10*3/uL (ref 150–400)
RBC: 4.16 MIL/uL (ref 3.87–5.11)
RDW: 16.7 % — ABNORMAL HIGH (ref 11.5–15.5)
WBC: 19 10*3/uL — ABNORMAL HIGH (ref 4.0–10.5)
nRBC: 0 % (ref 0.0–0.2)

## 2022-06-12 LAB — BASIC METABOLIC PANEL
Anion gap: 8 (ref 5–15)
BUN: 7 mg/dL — ABNORMAL LOW (ref 8–23)
CO2: 22 mmol/L (ref 22–32)
Calcium: 8.9 mg/dL (ref 8.9–10.3)
Chloride: 108 mmol/L (ref 98–111)
Creatinine, Ser: 0.85 mg/dL (ref 0.44–1.00)
GFR, Estimated: >60 mL/min (ref >60)
Glucose, Bld: 136 mg/dL — ABNORMAL HIGH (ref 70–99)
Potassium: 3.3 mmol/L — ABNORMAL LOW (ref 3.5–5.1)
Sodium: 138 mmol/L (ref 135–145)

## 2022-06-12 LAB — LIPID PANEL
Cholesterol: 112 mg/dL (ref 0–200)
HDL: 45 mg/dL (ref >40)
LDL Cholesterol: 45 mg/dL (ref 0–99)
Total CHOL/HDL Ratio: 2.5 RATIO
Triglycerides: 108 mg/dL (ref <150)
VLDL: 22 mg/dL (ref 0–40)

## 2022-06-12 LAB — MRSA NEXT GEN BY PCR, NASAL: MRSA by PCR Next Gen: NOT DETECTED

## 2022-06-12 MED ORDER — POTASSIUM CHLORIDE CRYS ER 20 MEQ PO TBCR: 40.0000 meq | EXTENDED_RELEASE_TABLET | Freq: Two times a day (BID) | ORAL | Status: DC

## 2022-06-12 MED ORDER — POTASSIUM CHLORIDE 10 MEQ/100ML IV SOLN
10.0000 meq | INTRAVENOUS | Status: DC
  Administered 2022-06-12: 10 meq via INTRAVENOUS
  Filled 2022-06-12 (×4): qty 100

## 2022-06-12 MED ORDER — ORAL CARE MOUTH RINSE: 15.0000 mL | OROMUCOSAL | Status: DC | PRN

## 2022-06-12 MED ORDER — INSULIN DETEMIR 100 UNIT/ML ~~LOC~~ SOLN
24.0000 [IU] | Freq: Two times a day (BID) | SUBCUTANEOUS | Status: DC
  Administered 2022-06-12 – 2022-06-16 (×9): 24 [IU] via SUBCUTANEOUS
  Filled 2022-06-12 (×10): qty 0.24

## 2022-06-12 MED ORDER — CLEVIDIPINE BUTYRATE 0.5 MG/ML IV EMUL: 0.0000 mg/h | INTRAVENOUS | Status: DC

## 2022-06-12 MED ORDER — STROKE: EARLY STAGES OF RECOVERY BOOK
Status: AC
  Filled 2022-06-12: qty 1

## 2022-06-12 MED ORDER — HEPARIN SODIUM (PORCINE) 5000 UNIT/ML IJ SOLN
5000.0000 [IU] | Freq: Three times a day (TID) | INTRAMUSCULAR | Status: DC
  Administered 2022-06-13 – 2022-06-16 (×10): 5000 [IU] via SUBCUTANEOUS
  Filled 2022-06-12 (×10): qty 1

## 2022-06-12 MED ORDER — GABAPENTIN 300 MG PO CAPS
300.0000 mg | ORAL_CAPSULE | Freq: Two times a day (BID) | ORAL | Status: DC
  Administered 2022-06-12 – 2022-06-16 (×8): 300 mg via ORAL
  Filled 2022-06-12 (×8): qty 1

## 2022-06-12 MED ORDER — DEXTROSE IN LACTATED RINGERS 5 % IV SOLN: INTRAVENOUS | Status: DC

## 2022-06-12 MED ORDER — CLOPIDOGREL BISULFATE 75 MG PO TABS
75.0000 mg | ORAL_TABLET | Freq: Every day | ORAL | Status: DC
  Administered 2022-06-12 – 2022-06-16 (×5): 75 mg via ORAL
  Filled 2022-06-12 (×5): qty 1

## 2022-06-12 MED ORDER — CHLORHEXIDINE GLUCONATE CLOTH 2 % EX PADS
6.0000 | MEDICATED_PAD | Freq: Every day | CUTANEOUS | Status: DC
  Administered 2022-06-12 – 2022-06-15 (×4): 6 via TOPICAL

## 2022-06-12 MED ORDER — DONEPEZIL HCL 10 MG PO TABS
10.0000 mg | ORAL_TABLET | Freq: Every day | ORAL | Status: DC
  Administered 2022-06-12 – 2022-06-15 (×4): 10 mg via ORAL
  Filled 2022-06-12 (×4): qty 1

## 2022-06-12 MED ORDER — ALLOPURINOL 100 MG PO TABS
300.0000 mg | ORAL_TABLET | Freq: Every day | ORAL | Status: DC
  Administered 2022-06-12 – 2022-06-16 (×5): 300 mg via ORAL
  Filled 2022-06-12: qty 3
  Filled 2022-06-12: qty 1
  Filled 2022-06-12: qty 3
  Filled 2022-06-12: qty 1
  Filled 2022-06-12: qty 3

## 2022-06-12 MED ORDER — POTASSIUM CHLORIDE CRYS ER 20 MEQ PO TBCR
40.0000 meq | EXTENDED_RELEASE_TABLET | Freq: Two times a day (BID) | ORAL | Status: AC
  Administered 2022-06-12 (×2): 40 meq via ORAL
  Filled 2022-06-12 (×2): qty 2

## 2022-06-12 MED ORDER — LABETALOL HCL 5 MG/ML IV SOLN: 10.0000 mg | INTRAVENOUS | Status: DC | PRN

## 2022-06-12 MED ORDER — ASPIRIN 81 MG PO CHEW
81.0000 mg | CHEWABLE_TABLET | Freq: Every day | ORAL | Status: DC
  Administered 2022-06-12 – 2022-06-16 (×5): 81 mg via ORAL
  Filled 2022-06-12 (×5): qty 1

## 2022-06-12 MED ORDER — ATENOLOL 25 MG PO TABS
50.0000 mg | ORAL_TABLET | Freq: Every day | ORAL | Status: DC
  Administered 2022-06-12 – 2022-06-16 (×5): 50 mg via ORAL
  Filled 2022-06-12 (×2): qty 1
  Filled 2022-06-12 (×3): qty 2

## 2022-06-12 MED ORDER — INSULIN ASPART 100 UNIT/ML IJ SOLN
3.0000 [IU] | INTRAMUSCULAR | Status: DC
  Administered 2022-06-12 – 2022-06-14 (×4): 6 [IU] via SUBCUTANEOUS
  Administered 2022-06-14: 9 [IU] via SUBCUTANEOUS
  Administered 2022-06-14: 6 [IU] via SUBCUTANEOUS
  Administered 2022-06-15: 9 [IU] via SUBCUTANEOUS
  Administered 2022-06-15 – 2022-06-16 (×3): 6 [IU] via SUBCUTANEOUS

## 2022-06-12 NOTE — Evaluation (Signed)
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   Wound infection 02/05/2021   Venous stasis dermatitis of both lower extremities    Pneumonia due to COVID-19 virus 06/29/2019   Hypophosphatemia 06/24/2019   Morbidly obese ([place]) 06/23/2019   Diabetes mellitus type 2, uncontrolled, with complications [id number]   Anxiety 06/22/2019   Depressive disorder 06/22/2019   Essential hypertension 06/22/2019   HLD (hyperlipidemia) 06/22/2019   COVID-19 06/21/2019   Severe protein-calorie malnutrition ([place]) 06/21/2019   Acute respiratory failure with hypoxia ([place]) 06/21/2019   Diabetic foot ulcer associated with type 2 diabetes mellitus ([place])    Cellulitis 07/05/2016   Depression with anxiety 05/30/2016   Chronic skin ulcer of lower leg ([place]) 05/30/2016   Generalized weakness 05/30/2016   Left arm weakness 05/30/2016   Left Lower extremity pain  04/02/2016   Lower extremity pain, inferior, left 04/02/2016   Left leg pain    Personal history of noncompliance with medical treatment, presenting hazards to health 03/31/2016   Jaw pain 03/27/2016   Left leg cellulitis 03/24/2016   Cellulitis of left lower extremity    Primary insomnia    Adjustment disorder    Morbid obesity ([place]) 01/27/2016   Wheelchair dependent 01/27/2016   Gout 01/27/2016   Asthma 01/27/2016   Arthritis 01/27/2016   Uncontrolled diabetes mellitus type 2 with peripheral artery disease 01/25/2016   Cellulitis of left leg 01/11/2016   Insulin-requiring or dependent type II diabetes mellitus ([place]) 01/11/2016   Hypertension 01/11/2016   Past Medical History:  Past  Medical History:  Diagnosis Date   Anxiety    Arthritis    "back, arms, legs" (03/24/2016)   Asthma    Chronic lower back pain    Colonic polyp    last colonoscopy done in 2009 with normal results per medical record   Depressive disorder    Gastric polyp    Gout    has taken allopurinol 300mg  once daily in past   Headache    History of hiatal hernia    Hypertension    Migraine    "none in awhile; might have a couple/year" (03/24/2016)   Mixed hyperlipidemia    01/2016 Total chol 141, HDL 59, LDL 63, ration 1.1   Osteoarthritis    TIA (transient ischemic attack) 11/2014   Type II diabetes mellitus ([place])    Past Surgical History:  Past Surgical History:  Procedure Laterality Date   ARTERY BIOPSY Right 08/12/2016   Procedure: BIOPSY TEMPORAL ARTERY;  Surgeon: [name], MD;  Location: [facility];  Service: Vascular;  Laterality: Right;  BIOPSY TEMPORAL ARTERY   BREAST BIOPSY Left ~ 2015   benign   CARPAL TUNNEL RELEASE Bilateral    DILATION AND CURETTAGE OF UTERUS     KNEE ARTHROSCOPY Right 2003   in [place]   HPI:  Pt is a 71 yo female presenting 12/27 with L weakness and facial droop noted after fall from bed. Pt s/p revascularization of R MCA. MRI shows an acute R MCA distribution infarct involving the anterior R frontal lobe. PMH includes: HH,  HTN, TIA, diabetes, migraine, anxiety, depressive disorder, mixed HLD   Assessment / Plan / Recommendation Clinical Impression  Pt has a mild dysarthria as well as acute on perhaps more chronic cognitive deficits, with pt and spouse reporting some memory deficits at baseline. However, she was still able to manage her medications herself, setting up her own pillbox and utilizing it on a daily basis. Today pt does not initially demonstrate intellectual awareness of any acute physical or speech changes, but after cues and discussion with SLP and husband, she  starts to acknowledge some of the changes. She has reduced sustained attention, impacting topic maintenance in conversation, as well as reduced functional and verbal problem solving. She would benefit from SLP f/u acutely and at next level of care.    SLP Assessment  SLP Recommendation/Assessment: Patient needs continued Speech Lanaguage Pathology Services SLP Visit Diagnosis: Cognitive communication deficit (R41.841);Dysarthria and anarthria (R47.1)    Recommendations for follow up therapy are one component of a multi-disciplinary discharge planning process, led by the attending physician.  Recommendations may be updated based on patient status, additional functional criteria and insurance authorization.    Follow Up Recommendations  Acute inpatient rehab (3hours/day)    Assistance Recommended at Discharge  Frequent or constant Supervision/Assistance  Functional Status Assessment Patient has had a recent decline in their functional status and demonstrates the ability to make significant improvements in function in a reasonable and predictable amount of time.  Frequency and Duration min 2x/week  2 weeks      SLP Evaluation Cognition  Overall Cognitive Status: Impaired/Different from baseline Arousal/Alertness: Awake/alert Orientation Level: Oriented X4 Attention: Sustained Sustained Attention: Impaired Sustained Attention Impairment: Verbal basic Memory: Impaired Memory Impairment: Retrieval deficit;Decreased recall of new information (may be near baseline per pt/spouse) Awareness: Impaired Awareness Impairment: Intellectual impairment;Emergent impairment Problem Solving: Impaired Problem Solving Impairment: Functional basic;Verbal basic Safety/Judgment: Impaired       Comprehension  Auditory Comprehension Overall Auditory Comprehension: Appears within functional limits for tasks assessed    Expression Expression Primary Mode of Expression: Verbal Verbal Expression Overall  Verbal Expression: Appears within functional limits for tasks assessed Written Expression Dominant Hand: Right   Oral / Motor  Oral Motor/Sensory Function Overall Oral Motor/Sensory Function: Moderate impairment Facial ROM: Reduced left;Suspected CN VII (facial) dysfunction Facial Symmetry: Abnormal symmetry left;Suspected CN VII (facial) dysfunction Facial Strength: Reduced left;Suspected CN VII (facial) dysfunction Facial Sensation: Reduced left;Suspected CN V (Trigeminal) dysfunction Lingual ROM: Within Functional Limits Lingual Symmetry: Within Functional Limits Lingual Strength: Within Functional Limits Lingual Sensation: Within Functional Limits Velum:  (difficult to visualize) Mandible: Within Functional Limits Motor Speech Overall Motor Speech: Impaired Respiration: Within functional limits Phonation: Normal Resonance: Within functional limits Articulation: Impaired Level of Impairment: Conversation Intelligibility: Intelligibility reduced Conversation: 75-100% accurate            Mahala Menghini., M.A. CCC-SLP Acute Rehabilitation Services Office [phone]  Secure chat preferred  06/12/2022, 12:27 PM

## 2022-06-12 NOTE — Inpatient Diabetes Management (Signed)
Inpatient Diabetes Program Recommendations  AACE/ADA: New Consensus Statement on Inpatient Glycemic Control (2015)  Target Ranges:  Prepandial:   less than 140 mg/dL      Peak postprandial:   less than 180 mg/dL (1-2 hours)      Critically ill patients:  140 - 180 mg/dL   Lab Results  Component Value Date   GLUCAP 142 (H) 06/12/2022   HGBA1C 9.5 (H) 06/11/2022    Review of Glycemic Control  Latest Reference Range & Units 06/12/22 03:32 06/12/22 04:46 06/12/22 05:26 06/12/22 06:00 06/12/22 07:19  Glucose-Capillary 70 - 99 mg/dL 383 (H) IV insulin 3.4 units 138 (H) IV insulin 4.6 units 142 (H) IV insulin 5 units  IV insulin 5 units 142 (H) IV insulin 5 units   (H): Data is abnormally high  Diabetes history: DM2 Outpatient Diabetes medications: Lantus 70 units QD Current orders for Inpatient glycemic control: IV insulin  Inpatient Diabetes Program Recommendations:    Received page from [place], [place].  Stroke team is ready to transition to IV insulin.  She did receive Decadron 10 mg on 12/27 @ 13:00.  Given weight based and drip rates, please consider:  1-Levemir 25 units BID (please administer 2 hrs prior to discontinuing IV insulin) 2-Novolog 0-15 units Q4H (she is currently NPO)  3-Once eating well-Novolog 0-15 TID and HS & Novolog 3 units TID with meals if consumes at least 50%  Will continue to follow while inpatient.  Thank you, Dulce Sellar, MSN, CDCES Diabetes Coordinator Inpatient Diabetes Program [phone] (team pager from 8a-5p)

## 2022-06-12 NOTE — Progress Notes (Signed)
Lower extremity venous has been completed.   Preliminary results in CV Proc.   Kristiana Jacko Glena Pharris 06/12/2022 3:19 PM    

## 2022-06-12 NOTE — Evaluation (Signed)
Clinical/Bedside Swallow Evaluation Patient Details  Name: MANUELA HALBUR MRN: [id number] Date of Birth: Dec 22, 1950  Today's Date: 06/12/2022 Time: SLP Start Time (ACUTE ONLY): 1048 SLP Stop Time (ACUTE ONLY): 1105 SLP Time Calculation (min) (ACUTE ONLY): 17 min  Past Medical History:  Past Medical History:  Diagnosis Date   Anxiety    Arthritis    "back, arms, legs" (03/24/2016)   Asthma    Chronic lower back pain    Colonic polyp    last colonoscopy done in 2009 with normal results per medical record   Depressive disorder    Gastric polyp    Gout    has taken allopurinol 300mg  once daily in past   Headache    History of hiatal hernia    Hypertension    Migraine    "none in awhile; might have a couple/year" (03/24/2016)   Mixed hyperlipidemia    01/2016 Total chol 141, HDL 59, LDL 63, ration 1.1   Osteoarthritis    TIA (transient ischemic attack) 11/2014   Type II diabetes mellitus ([facility])    Past Surgical History:  Past Surgical History:  Procedure Laterality Date   ARTERY BIOPSY Right 08/12/2016   Procedure: BIOPSY TEMPORAL ARTERY;  Surgeon: 08/14/2016, MD;  Location: [facility];  Service: Vascular;  Laterality: Right;  BIOPSY TEMPORAL ARTERY   BREAST BIOPSY Left ~ 2015   benign   CARPAL TUNNEL RELEASE Bilateral    DILATION AND CURETTAGE OF UTERUS     KNEE ARTHROSCOPY Right 2003   in [place]   LAPAROSCOPIC CHOLECYSTECTOMY     TUBAL LIGATION     VAGINAL HYSTERECTOMY  1982   HPI:  Pt is a 71 yo female presenting 12/27 with L weakness and facial droop noted after fall from bed. Pt s/p revascularization of R MCA. MRI shows an acute R MCA distribution infarct involving the anterior R frontal lobe. PMH includes: HH, HTN, TIA, diabetes, migraine, anxiety, depressive disorder, mixed HLD    Assessment / Plan / Recommendation  Clinical Impression  Pt presents with oral deficits secondary to sensory and motor impairment. She has reduced labial seal on the L that contributes to  anterior loss, with pt demonstrating minimal awareness despite cues to self-correct. She has improved clearance from within her oral cavity and there are no overt s/s of aspiration even when challenged to drink 3+ oz consecutively. Recommend that pt start with Dys 2 diet and thin liquids, with use of general aspiration precautions and assistance in monitoring for anterior loss. SLP Visit Diagnosis: Dysphagia, unspecified (R13.10)    Aspiration Risk  Mild aspiration risk;Moderate aspiration risk    Diet Recommendation Dysphagia 2 (Fine chop);Thin liquid   Liquid Administration via: Cup;Straw Medication Administration: Whole meds with puree Supervision: Patient able to self feed;Full supervision/cueing for compensatory strategies Compensations: Minimize environmental distractions;Slow rate;Small sips/bites;Monitor for anterior loss Postural Changes: Seated upright at 90 degrees    Other  Recommendations Oral Care Recommendations: Oral care BID    Recommendations for follow up therapy are one component of a multi-disciplinary discharge planning process, led by the attending physician.  Recommendations may be updated based on patient status, additional functional criteria and insurance authorization.  Follow up Recommendations Acute inpatient rehab (3hours/day)      Assistance Recommended at Discharge    Functional Status Assessment Patient has had a recent decline in their functional status and demonstrates the ability to make significant improvements in function in a reasonable and predictable amount of time.  Frequency  and Duration min 2x/week  2 weeks       Prognosis Prognosis for Safe Diet Advancement: Good Barriers to Reach Goals: Cognitive deficits      Swallow Study   General HPI: Pt is a 71 yo female presenting 12/27 with L weakness and facial droop noted after fall from bed. Pt s/p revascularization of R MCA. MRI shows an acute R MCA distribution infarct involving the  anterior R frontal lobe. PMH includes: HH, HTN, TIA, diabetes, migraine, anxiety, depressive disorder, mixed HLD Type of Study: Bedside Swallow Evaluation Previous Swallow Assessment: none in chart Diet Prior to this Study: NPO Temperature Spikes Noted: No Respiratory Status: Room air History of Recent Intubation:  (for procedure only 12/27) Behavior/Cognition: Alert;Cooperative;Pleasant mood;Requires cueing Oral Cavity Assessment: Within Functional Limits Oral Care Completed by SLP: No Oral Cavity - Dentition: Adequate natural dentition Vision: Functional for self-feeding Self-Feeding Abilities: Needs assist Patient Positioning: Upright in bed Baseline Vocal Quality: Normal Volitional Cough: Strong Volitional Swallow: Able to elicit    Oral/Motor/Sensory Function Overall Oral Motor/Sensory Function: Moderate impairment Facial ROM: Reduced left;Suspected CN VII (facial) dysfunction Facial Symmetry: Abnormal symmetry left;Suspected CN VII (facial) dysfunction Facial Strength: Reduced left;Suspected CN VII (facial) dysfunction Facial Sensation: Reduced left;Suspected CN V (Trigeminal) dysfunction Lingual ROM: Within Functional Limits Lingual Symmetry: Within Functional Limits Lingual Strength: Within Functional Limits Lingual Sensation: Within Functional Limits Velum:  (difficult to visualize) Mandible: Within Functional Limits   Ice Chips Ice chips: Within functional limits Presentation: Spoon   Thin Liquid Thin Liquid: Impaired Presentation: Cup;Self Fed;Straw Oral Phase Impairments: Reduced labial seal Oral Phase Functional Implications: Left anterior spillage    Nectar Thick Nectar Thick Liquid: Not tested   Honey Thick Honey Thick Liquid: Not tested   Puree Puree: Impaired Presentation: Self Fed;Spoon Oral Phase Impairments: Reduced labial seal Oral Phase Functional Implications: Left anterior spillage   Solid     Solid: Impaired Presentation: Self Fed Oral Phase  Impairments: Reduced labial seal Oral Phase Functional Implications: Left anterior spillage      Osie Bond., M.A. [place] Office [phone]  Secure chat preferred  06/12/2022,12:16 PM

## 2022-06-12 NOTE — Progress Notes (Signed)
Referring Physician(s): Amie Portland, MD   Supervising Physician: Luanne Bras  Patient Status:  [facility] - In-pt  Chief Complaint:  Status post 1day endovascular revascularization of right middle cerebral artery ,codominant superior division with 2 passes   Subjective:  Pt resting in bed. She denies HA, N/V.  She states she is waiting for swallow test so she can eat. She adds that she does not remember anything that happened yesterday with her stroke.   Allergies: Ace inhibitors, Tizanidine, Metformin and related, and Propofol  Medications: Prior to Admission medications   Medication Sig Start Date End Date Taking? Authorizing Provider  ACCU-CHEK FASTCLIX LANCETS MISC 1 each. Check blood sugar once daily 03/20/17   [provider]  ACCU-CHEK SMARTVIEW test strip 1 each by Other route 2 (two) times daily. And lancets 2/day 05/21/21   Renato Shin, MD  acetaminophen (TYLENOL) 500 MG tablet Take 500 mg by mouth daily as needed for moderate pain.    [provider]  allopurinol (ZYLOPRIM) 300 MG tablet Take 300 mg by mouth daily.    [provider]  amoxicillin-clavulanate (AUGMENTIN) 875-125 MG tablet Take 1 tablet by mouth every 12 (twelve) hours. 10/15/21   Valarie Merino, MD  atenolol (TENORMIN) 25 MG tablet Take 25-50 mg by mouth See admin instructions. Take 50 mg every morning and then 25 mg every evening    [provider]  Blood Glucose Monitoring Suppl (ACCU-CHEK NANO SMARTVIEW) w/Device KIT 1 each See admin instructions. 05/23/17   [provider]  furosemide (LASIX) 20 MG tablet Take 1 tablet (20 mg total) by mouth daily. Patient taking differently: Take 40 mg by mouth daily. 07/07/16   Thurnell Lose, MD  gabapentin (NEURONTIN) 300 MG capsule Take 2 capsules (600 mg total) by mouth 2 (two) times daily. 02/08/21   Sharion Settler, DO  hydrocerin (EUCERIN) CREA Apply 1 application topically daily. Patient taking  differently: Apply 1 application  topically daily as needed (dry skin). 01/15/16   Thurnell Lose, MD  insulin glargine (LANTUS) 100 UNIT/ML Solostar Pen Inject 70 Units into the skin every morning. And pen needles 1/day 05/21/21   Renato Shin, MD  Insulin Pen Needle (PENTIPS) 32G X 4 MM MISC 1 each by Does not apply route daily. 02/08/21   Sharion Settler, DO  lurasidone (LATUDA) 20 MG TABS tablet Take 1 tablet (20 mg total) by mouth daily with breakfast for 14 days. 06/02/22 06/16/22  Freida Busman, MD  lurasidone (LATUDA) 40 MG TABS tablet Take 1 tablet (40 mg total) by mouth daily with breakfast. 06/16/22   Freida Busman, MD  NOVOFINE 32G X 6 MM MISC 1 each as needed. 08/09/17   [provider]  polyethylene glycol (MIRALAX / GLYCOLAX) packet Take 17 g by mouth daily as needed for moderate constipation.    [provider]  potassium chloride (K-DUR) 10 MEQ tablet Take 1 tablet (10 mEq total) by mouth daily. 07/07/16   Thurnell Lose, MD  protein supplement shake (PREMIER PROTEIN) LIQD Take 325 mLs (11 oz total) by mouth daily. 03/18/17   Eugenie Filler, MD  traZODone (DESYREL) 50 MG tablet Take 1 tablet (50 mg total) by mouth at bedtime and may repeat dose one time if needed. 06/02/22   Freida Busman, MD     Vital Signs: BP 115/63 Comment: clevi down  Pulse 84   Temp 98.2 F (36.8 C) (Oral)   Resp (!) 23   Wt 283  lb 1.1 oz (128.4 kg)   SpO2 95%   BMI 47.11 kg/m   Physical Exam Vitals reviewed.  Constitutional:      General: She is not in acute distress.    Appearance: She is obese. She is not ill-appearing.  HENT:     Head: Normocephalic and atraumatic.  Eyes:     Extraocular Movements: Extraocular movements intact.     Pupils: Pupils are equal, round, and reactive to light.  Pulmonary:     Effort: Pulmonary effort is normal. No respiratory distress.  Skin:    General: Skin is warm and dry.     Comments: R radial access site is soft with no  active bleeding and no appreciable pseudoaneurysm. Dressing removed.       Neurological:     Mental Status: She is alert and oriented to person, place, and time.     Comments: Alert, aware and oriented X 3 Speech and comprehension is intact.  PERRL bilaterally Left sided facial droop noted Tongue midline Can spontaneously move RUE, RLE, LUE. No movement on LLE Hand grip strength R 5/5, Left 1/5 Fine motor and coordination intact on R Fine motor and coordination absent on L        Psychiatric:        Mood and Affect: Mood normal.        Behavior: Behavior normal.        Thought Content: Thought content normal.        Judgment: Judgment normal.     Imaging: MR BRAIN WO CONTRAST  Result Date: 06/12/2022 CLINICAL DATA:  Follow-up examination for stroke. EXAM: MRI HEAD WITHOUT CONTRAST TECHNIQUE: Multiplanar, multiecho pulse sequences of the brain and surrounding structures were obtained without intravenous contrast. COMPARISON:  Prior studies from 06/11/2022. FINDINGS: Brain: Examination moderately degraded by motion artifact. Cerebral volume within normal limits. Scattered patchy T2/FLAIR hyperintensity involving the supratentorial cerebral white matter, nonspecific, but most commonly related to chronic microvascular ischemic disease. Mild patchy involvement of the pons. Appearance is mild for age. Restricted diffusion involving the anterior right frontal lobe, most pronounced at the right frontal operculum, consistent with acute right MCA distribution infarct. Associated petechial blood products without frank hemorrhagic transformation Heidelberg classification 1a: HI1, scattered small petechiae, no mass effect. No other evidence for acute or subacute ischemia. No other acute or chronic intracranial blood products. No mass lesion, midline shift or mass effect. No hydrocephalus or extra-axial fluid collection. Pituitary gland and suprasellar region within normal limits. Vascular: Major  intracranial vascular flow voids are grossly maintained. Skull and upper cervical spine: Craniocervical junction grossly within normal limits. Diffuse loss of normal bone marrow signal, nonspecific but can be seen with anemia, smoking, obesity, and infiltrative/myelofibrotic marrow processes. No scalp soft tissue abnormality. Sinuses/Orbits: Globes and orbital soft tissues grossly within normal limits. Paranasal sinuses are largely clear. Small bilateral mastoid effusions noted. Negative nasopharynx. Other: None. IMPRESSION: 1. Motion degraded exam. 2. 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 Associated petechial blood products without frank hemorrhagic transformation or significant mass effect. Heidelberg classification 1a: HI1, scattered small petechiae, no mass effect. 3. Underlying mild chronic microvascular ischemic disease. Electronically Signed   By: Jeannine Boga M.D.   On: 06/12/2022 02:54   CT ANGIO HEAD NECK W WO CM W PERF (CODE STROKE)  Addendum Date: 06/11/2022   ADDENDUM REPORT: 06/11/2022 20:25 ADDENDUM: CTA head and CT perfusion head results communicated to Dr. Rory Percy by telephone at 12:32 p.m. on 06/11/2022. Electronically  Signed   By: Kellie Simmering D.O.   On: 06/11/2022 20:25   Result Date: 06/11/2022 CLINICAL DATA:  Provided history: Neuro deficit, acute, stroke suspected. EXAM: CT ANGIOGRAPHY HEAD AND NECK CT PERFUSION BRAIN TECHNIQUE: Multidetector CT imaging of the head and neck was performed using the standard protocol during bolus administration of intravenous contrast. Multiplanar CT image reconstructions and MIPs were obtained to evaluate the vascular anatomy. Carotid stenosis measurements (when applicable) are obtained utilizing NASCET criteria, using the distal internal carotid diameter as the denominator. Multiphase CT imaging of the brain was performed following IV bolus contrast injection. Subsequent parametric perfusion maps were  calculated using RAPID software. RADIATION DOSE REDUCTION: This exam was performed according to the departmental dose-optimization program which includes automated exposure control, adjustment of the mA and/or kV according to patient size and/or use of iterative reconstruction technique. CONTRAST:  174m OMNIPAQUE IOHEXOL 350 MG/ML SOLN COMPARISON:  Non-contrast head CT performed earlier today 06/11/2022. FINDINGS: CTA NECK FINDINGS Aortic arch: Standard aortic branching. The visualized aortic arch is normal in caliber. Streak and beam hardening artifact arising from a dense right-sided contrast bolus partially obscures the right subclavian artery. Within this limitation, there is no appreciable hemodynamically significant innominate or proximal subclavian artery stenosis. Right carotid system: CCA and ICA patent within the neck without stenosis or significant atherosclerotic disease. No evidence of dissection. Left carotid system: CCA and ICA patent within the neck without stenosis or significant atherosclerotic disease. No evidence of dissection. Vertebral arteries: Vertebral arteries codominant and patent within the neck without stenosis or significant atherosclerotic disease. No evidence of dissection. Skeleton: Levocurvature of the cervical spine. Dextrocurvature of the thoracic spine, partially imaged. Cervical spondylosis. No acute fracture or aggressive osseous lesion. Other neck: No neck mass or cervical lymphadenopathy. Upper chest: No consolidation within the imaged lung apices. Review of the MIP images confirms the above findings CTA HEAD FINDINGS Anterior circulation: The intracranial internal carotid arteries are patent. Nonstenotic atherosclerotic plaque within both vessels. The M1 middle cerebral arteries are patent. Abrupt occlusion of a proximal M2 right MCA vessel (for instance as seen on series 15, image 20). No left M2 proximal branch occlusion or high-grade proximal stenosis is identified. The  anterior cerebral arteries are patent. No intracranial aneurysm is identified. Posterior circulation: The intracranial vertebral arteries are patent. The basilar artery is patent. The posterior cerebral arteries are patent. Posterior communicating arteries are present bilaterally. Venous sinuses: Within the limitations of contrast timing, no convincing thrombus. Anatomic variants: None significant. Review of the MIP images confirms the above findings CT Brain Perfusion Findings: CBF (<30%) Volume: 233m(right MCA vascular territory) Perfusion (Tmax>6.0s) volume: 4568mright MCA vascular territory) Mismatch Volume: 47m110mfarction Location:Right MCA vascular territory. Attempts are being made to reach the ordering provider at this time. IMPRESSION: CTA neck: The common carotid, internal carotid and vertebral arteries are patent within the neck without stenosis or significant atherosclerotic disease. No evidence of dissection. CTA head: Abrupt occlusion of a proximal M2 right middle cerebral artery vessel. CT perfusion head: The perfusion software identifies a 26 mL core infarct within the right MCA vascular territory. The perfusion software identifies a 45 mL region of critically hypoperfused parenchyma within the right MCA vascular territory (utilizing the Tmax>6 seconds threshold). Reported mismatch volume: 19 mL. Electronically Signed: By: KyleKellie Simmering. On: 06/11/2022 12:28   ECHOCARDIOGRAM COMPLETE  Result Date: 06/11/2022    ECHOCARDIOGRAM REPORT   Patient Name:   EUNIJOEANNE ROBICHEAUXe of Exam: 06/11/2022 Medical  Rec #:  [id number]       Height:       65.0 in Accession #:    [id number]      Weight:       283.1 lb Date of Birth:  [birth year]      BSA:          2.293 m Patient Age:    89 years        BP:           131/76 mmHg Patient Gender: F               HR:           82 bpm. Exam Location:  Inpatient Procedure: 2D Echo, Cardiac Doppler and Color Doppler Indications:    Stroke  History:        Patient  has no prior history of Echocardiogram examinations.                 Risk Factors:Hypertension and Diabetes.  Sonographer:    Clayton Lefort RDCS (AE) Referring Phys: [id number] [facility]  Sonographer Comments: Patient is obese. IMPRESSIONS  1. Left ventricular ejection fraction, by estimation, is 65 to 70%. The left ventricle has normal function. The left ventricle has no regional wall motion abnormalities. There is mild concentric left ventricular hypertrophy. Left ventricular diastolic parameters were normal.  2. Right ventricular systolic function is normal. The right ventricular size is mildly enlarged. Tricuspid regurgitation signal is inadequate for assessing PA pressure.  3. Left atrial size was mildly dilated.  4. The mitral valve is grossly normal. No evidence of mitral valve regurgitation.  5. The aortic valve is tricuspid. There is mild calcification of the aortic valve. There is mild thickening of the aortic valve. Aortic valve regurgitation is not visualized. Aortic valve sclerosis/calcification is present, without any evidence of aortic stenosis.  6. Aortic dilatation noted. There is borderline dilatation of the ascending aorta, measuring 39 mm.  7. The inferior vena cava is normal in size with greater than 50% respiratory variability, suggesting right atrial pressure of 3 mmHg. Comparison(s): No prior Echocardiogram. FINDINGS  Left Ventricle: Left ventricular ejection fraction, by estimation, is 65 to 70%. The left ventricle has normal function. The left ventricle has no regional wall motion abnormalities. The left ventricular internal cavity size was normal in size. There is  mild concentric left ventricular hypertrophy. Left ventricular diastolic parameters were normal. Right Ventricle: The right ventricular size is mildly enlarged. No increase in right ventricular wall thickness. Right ventricular systolic function is normal. Tricuspid regurgitation signal is inadequate for assessing PA pressure. Left  Atrium: Left atrial size was mildly dilated. Right Atrium: Right atrial size was normal in size. Pericardium: There is no evidence of pericardial effusion. Mitral Valve: The mitral valve is grossly normal. There is mild thickening of the mitral valve leaflet(s). There is mild calcification of the mitral valve leaflet(s). Mild to moderate mitral annular calcification. No evidence of mitral valve regurgitation. Tricuspid Valve: The tricuspid valve is normal in structure. Tricuspid valve regurgitation is trivial. Aortic Valve: The aortic valve is tricuspid. There is mild calcification of the aortic valve. There is mild thickening of the aortic valve. Aortic valve regurgitation is not visualized. Aortic valve sclerosis/calcification is present, without any evidence of aortic stenosis. Aortic valve mean gradient measures 6.0 mmHg. Aortic valve peak gradient measures 11.3 mmHg. Aortic valve area, by VTI measures 2.75 cm. Pulmonic Valve: The pulmonic valve was normal in structure. Pulmonic valve regurgitation is  trivial. Aorta: Aortic dilatation noted and the aortic root is normal in size and structure. There is borderline dilatation of the ascending aorta, measuring 39 mm. Venous: The inferior vena cava is normal in size with greater than 50% respiratory variability, suggesting right atrial pressure of 3 mmHg. IAS/Shunts: The atrial septum is grossly normal.  LEFT VENTRICLE PLAX 2D LVIDd:         5.00 cm   Diastology LVIDs:         2.60 cm   LV e' medial:    7.51 cm/s LV PW:         0.90 cm   LV E/e' medial:  13.7 LV IVS:        0.90 cm   LV e' lateral:   9.90 cm/s LVOT diam:     2.20 cm   LV E/e' lateral: 10.4 LV SV:         91 LV SV Index:   40 LVOT Area:     3.80 cm  RIGHT VENTRICLE             IVC RV Basal diam:  3.60 cm     IVC diam: 1.30 cm RV Mid diam:    3.90 cm RV S prime:     17.20 cm/s TAPSE (M-mode): 3.1 cm LEFT ATRIUM             Index        RIGHT ATRIUM           Index LA diam:        4.10 cm 1.79 cm/m    RA Area:     12.60 cm LA Vol (A2C):   77.9 ml 33.98 ml/m  RA Volume:   27.00 ml  11.78 ml/m LA Vol (A4C):   54.6 ml 23.81 ml/m LA Biplane Vol: 67.2 ml 29.31 ml/m  AORTIC VALVE AV Area (Vmax):    2.85 cm AV Area (Vmean):   2.73 cm AV Area (VTI):     2.75 cm AV Vmax:           168.00 cm/s AV Vmean:          113.000 cm/s AV VTI:            0.330 m AV Peak Grad:      11.3 mmHg AV Mean Grad:      6.0 mmHg LVOT Vmax:         126.00 cm/s LVOT Vmean:        81.200 cm/s LVOT VTI:          0.239 m LVOT/AV VTI ratio: 0.72  AORTA Ao Root diam: 2.60 cm Ao Asc diam:  3.90 cm MITRAL VALVE MV Area (PHT): 2.54 cm     SHUNTS MV Decel Time: 299 msec     Systemic VTI:  0.24 m MV E velocity: 103.00 cm/s  Systemic Diam: 2.20 cm MV A velocity: 124.00 cm/s MV E/A ratio:  0.83 Gwyndolyn Kaufman MD Electronically signed by Gwyndolyn Kaufman MD Signature Date/Time: 06/11/2022/5:36:33 PM    Final    CT HEAD CODE STROKE WO CONTRAST  Result Date: 06/11/2022 CLINICAL DATA:  Code stroke.  Neuro deficit, acute, stroke suspected EXAM: CT HEAD WITHOUT CONTRAST TECHNIQUE: Contiguous axial images were obtained from the base of the skull through the vertex without intravenous contrast. RADIATION DOSE REDUCTION: This exam was performed according to the departmental dose-optimization program which includes automated exposure control, adjustment of the mA and/or kV according to patient size and/or use  of iterative reconstruction technique. COMPARISON:  CT head 03/15/2020. FINDINGS: Brain: Asymmetric hypoattenuation and loss of gray differentiation involving the right insula, concerning for right MCA territory infarct. No evidence of acute hemorrhage, hydrocephalus, extra-axial collection or mass lesion/mass effect. Vascular: No definite hyperdense vessel identified. Skull: No acute fracture. Sinuses/Orbits: Clear sinuses.  No acute orbital findings. ASPECTS ([place] Stroke Program Early CT Score): 7 IMPRESSION: 1. Asymmetric hypoattenuation  and loss of gray differentiation involving the right insula and overlying operculum, concerning for acute right MCA territory infarct. Recommend MRI. 2. ASPECTS is 7. Code stroke imaging results were communicated on 06/11/2022 at 11:36 am to provider Dr. Rory Percy via telephone, who verbally acknowledged these results. Electronically Signed   By: Margaretha Sheffield M.D.   On: 06/11/2022 11:36    Labs:  CBC: Recent Labs    10/14/21 2030 10/15/21 0824 06/11/22 1240 06/11/22 1243 06/12/22 0527  WBC 9.5 8.4 14.8*  --  19.0*  HGB 11.9* 11.9* 11.1* 13.3 11.4*  HCT 38.1 38.4 37.5 39.0 34.7*  PLT 414* 405* 324  --  334    COAGS: Recent Labs    06/11/22 1240  INR 1.2  APTT 31    BMP: Recent Labs    10/14/21 2030 10/15/21 0824 06/11/22 1240 06/11/22 1243 06/12/22 0527  NA 137 138 133* 136 138  K 4.0 3.9 3.9 4.0 3.3*  CL 104 105 102 102 108  CO2 24 24 17*  --  22  GLUCOSE 214* 280* 367* 375* 136*  BUN _0 7*  CALCIUM 9.3 9.5 8.7*  --  8.9  CREATININE 1.03* 0.96 0.95 0.60 0.85  GFRNONAA 58* >60 >60  --  >60    LIVER FUNCTION TESTS: Recent Labs    10/14/21 2030 06/11/22 1240  BILITOT 0.3 0.6  AST 10* 28  ALT 9 159*  ALKPHOS 93 128*  PROT 8.9* 8.5*  ALBUMIN 2.9* 2.6*    Assessment and Plan:  71 yo female 1 day s/p R MCA thrombectomy.  Patient seen today for follow up. Pt noted to be resting in bed. She is in no distress. She is A&O x3 but does not recall events leading up to stroke. She is able to follow commands appropriately and has significant LUE/LLE  weakness.  Right radial puncture site unremarkable.   Further plans per neurology/primary team.   NIR remains available as needed, please call with questions or concerns.  Electronically Signed: Tyson Alias, NP 06/12/2022, 9:13 AM   I spent a total of 15 Minutes at the the patient's bedside AND on the patient's hospital floor or unit, greater than 50% of which was counseling/coordinating care for R  MCA occlusion.

## 2022-06-12 NOTE — Evaluation (Signed)
Occupational Therapy Evaluation Patient Details Name: [name] MRN: [id number] DOB: 1950-06-19 Today's Date: 06/12/2022   History of Present Illness Pt is a 71 y/o female admitted 12/27 via EMS with L sided weakness.  Also noted R gaze preference, left side flaccid.with neglect and Left field cut.  Pt s/p endovascular revascularization of R MCA achieving a TICI3 revascularization.  PMH venous stasis, HTN, DM2, morbid obesity,  wheelchair use, gout, chronic back pain, HLD , OA, bipolar d/o, PVD bil LE's   Clinical Impression   PTA pt lives at home with her husband @ wc level. At baseline, pt able to complete stand/squat pivot transfers to her wc and  ADL tasks, however her husband assist with donning socks, LB dressing changes (although ace wraps were not properly placed) and IADL tasks. Pt demonstrates a significant decline in functional status, requiring total A for LB ADL and Max A +2 for bed mobility due to deficits listed below. Pt unable to stand at this time with total A +2. Staff will need to mobilize pt with lift equipment. Pt will benefit from rehab at SNF. Acute OT to follow.      Recommendations for follow up therapy are one component of a multi-disciplinary discharge planning process, led by the attending physician.  Recommendations may be updated based on patient status, additional functional criteria and insurance authorization.   Follow Up Recommendations  Skilled nursing-short term rehab (<3 hours/day)     Assistance Recommended at Discharge Frequent or constant Supervision/Assistance  Patient can return home with the following Two people to help with walking and/or transfers;Two people to help with bathing/dressing/bathroom;Assistance with cooking/housework;Assistance with feeding;Direct supervision/assist for medications management;Direct supervision/assist for financial management;Assist for transportation;Help with stairs or ramp for entrance    Functional Status  Assessment  Patient has had a recent decline in their functional status and demonstrates the ability to make significant improvements in function in a reasonable and predictable amount of time.  Equipment Recommendations  None recommended by OT    Recommendations for Other Services       Precautions / Restrictions Precautions Precautions: Fall Precaution Comments: B Una boots; non-healing wounds      Mobility Bed Mobility Overal bed mobility: Needs Assistance Bed Mobility: Rolling, Supine to Sit Rolling: Max assist, +2 for physical assistance   Supine to sit: Max assist, +2 for physical assistance     General bed mobility comments: truncal assist given at L scapula and hip to roll and 2 person assist up via R UE with padding to assist pivot of L side to scoot and square up at EOB    Transfers Overall transfer level: Needs assistance Equipment used: 2 person hand held assist Transfers: Sit to/from Stand Sit to Stand: Total assist, +2 physical assistance           General transfer comment: unable to clear hip off bed with significant assist bilaterally      Balance Overall balance assessment: Needs assistance Sitting-balance support: Single extremity supported, Feet supported Sitting balance-Leahy Scale: Poor Sitting balance - Comments: posterior lean; worse as attention diminished to postural control                                   ADL either performed or assessed with clinical judgement   ADL Overall ADL's : Needs assistance/impaired Eating/Feeding: Supervision/ safety;Set up Eating/Feeding Details (indicate cue type and reason): cues to slow down Grooming: Moderate  assistance   Upper Body Bathing: Moderate assistance   Lower Body Bathing: Total assistance;Bed level   Upper Body Dressing : Maximal assistance;Sitting   Lower Body Dressing: Total assistance;Bed level       Toileting- Clothing Manipulation and Hygiene: Total assistance  (foley)       Functional mobility during ADLs: +2 for physical assistance;Maximal assistance (bed mobility; unable to stand)       Vision Baseline Vision/History: 1 Wears glasses Vision Assessment?: Yes Eye Alignment: Within Functional Limits Ocular Range of Motion: Within Functional Limits Alignment/Gaze Preference: Within Defined Limits Tracking/Visual Pursuits: Decreased smoothness of horizontal tracking;Decreased smoothness of vertical tracking Saccades: Additional eye shifts occurred during testing;Additional head turns occurred during testing Visual Fields:  (will further assess) Depth Perception:  (no over/undershooting noted) Additional Comments: glasses not in room; husband to bring; abl eto read distant signs/on door/clock; therapist's name tags     Perception Perception Perception Tested?: Yes Perception Deficits: Inattention/neglect Comments: L inattention; unaware of leaving LUE behind during mobility   Praxis      Pertinent Vitals/Pain Pain Assessment Pain Assessment: Faces Faces Pain Scale: Hurts little more Pain Location: generalized; BLE; trunk with bed mobility; discomfort with laying flat Pain Descriptors / Indicators: Discomfort Pain Intervention(s): Limited activity within patient's tolerance     Hand Dominance Right   Extremity/Trunk Assessment Upper Extremity Assessment Upper Extremity Assessment: LUE deficits/detail LUE Deficits / Details: apparent sensory motor deficits; moving in more of a flexor synergy pattern; able to demonstrate a gross grasp, however unable to demonstrate any digit extension; difficulty with movemetns out of synergy; not using LUE funcitonally at this time; cues to incorporate LUE in movement/mobility   Lower Extremity Assessment Lower Extremity Assessment: Defer to PT evaluation (B non-healing wounds; PVD and skin abnormalities; odor noted from wounds) - nsg aware LLE Deficits / Details: significant weakness >R LE LLE:  Unable to fully assess due to pain    Cervical / Trunk Assessment Cervical / Trunk Assessment: Kyphotic;Other exceptions (increased body habitus); at risk for skin integrity under pannus   Communication Communication Communication: No difficulties   Cognition Arousal/Alertness: Awake/alert Behavior During Therapy: Impulsive Overall Cognitive Status: Impaired/Different from baseline Area of Impairment: Attention, Following commands, Safety/judgement, Awareness, Problem solving                   Current Attention Level: Selective   Following Commands: Follows one step commands inconsistently Safety/Judgement: Decreased awareness of safety, Decreased awareness of deficits Awareness: Emergent Problem Solving: Slow processing, Decreased initiation, Requires verbal cues, Requires tactile cues General Comments: tangential at times     General Comments  VSS    Exercises Exercises: General Upper Extremity General Exercises - Upper Extremity Shoulder Flexion: Left, AAROM, 5 reps Shoulder ABduction: AAROM, Left, 5 reps, Supine Elbow Flexion: AAROM, Left, 5 reps, Supine Elbow Extension: PROM, Left, 5 reps Wrist Flexion: AROM, Left, 5 reps Wrist Extension: PROM, Left, 5 reps Digit Composite Flexion: AAROM, AROM, Left, 5 reps Composite Extension: PROM, Left, 5 reps   Shoulder Instructions      Home Living Family/patient expects to be discharged to:: Skilled nursing facility Living Arrangements: Spouse/significant other Available Help at Discharge: Available 24 hours/day Type of Home: Apartment Home Access: Ramped entrance     Home Layout: One level     Bathroom Shower/Tub: Chief Strategy Officer: Standard Bathroom Accessibility: Yes   Home Equipment: Wheelchair - power;Wheelchair - manual;Transport chair;Hospital bed;Grab bars - toilet;Grab bars - tub/shower;Hand held Armed forces logistics/support/administrative officer (  2 wheels)   Additional Comments: transfers only;  has not "walked in years";  Lives With: Spouse    Prior Functioning/Environment Prior Level of Function : Needs assist       Physical Assist : ADLs (physical) (husband assist with putting on socks and IADL tasks like cooking; driving; pt able to move her wc into the kitchen)   ADLs (physical): IADLs            OT Problem List: Decreased strength;Decreased range of motion;Decreased activity tolerance;Impaired balance (sitting and/or standing);Impaired vision/perception;Decreased coordination;Decreased cognition;Decreased safety awareness;Decreased knowledge of use of DME or AE;Decreased knowledge of precautions;Cardiopulmonary status limiting activity;Impaired sensation;Impaired tone;Obesity;Impaired UE functional use;Pain      OT Treatment/Interventions: Self-care/ADL training;Therapeutic exercise;Neuromuscular education;DME and/or AE instruction;Splinting;Therapeutic activities;Cognitive remediation/compensation;Visual/perceptual remediation/compensation;Patient/family education;Balance training    OT Goals(Current goals can be found in the care plan section) Acute Rehab OT Goals Patient Stated Goal: to get stronger OT Goal Formulation: With patient/family Time For Goal Achievement: 06/26/22 Potential to Achieve Goals: Fair  OT Frequency: Min 2X/week    Co-evaluation PT/OT/SLP Co-Evaluation/Treatment: Yes Reason for Co-Treatment: For patient/therapist safety;To address functional/ADL transfers;Complexity of the patient's impairments (multi-system involvement) PT goals addressed during session: Mobility/safety with mobility OT goals addressed during session: ADL's and self-care      AM-PAC OT "6 Clicks" Daily Activity     Outcome Measure Help from another person eating meals?: A Little Help from another person taking care of personal grooming?: A Lot Help from another person toileting, which includes using toliet, bedpan, or urinal?: Total Help from another person bathing  (including washing, rinsing, drying)?: A Lot Help from another person to put on and taking off regular upper body clothing?: A Lot Help from another person to put on and taking off regular lower body clothing?: Total 6 Click Score: 11   End of Session Equipment Utilized During Treatment: Gait belt Nurse Communication: Mobility status;Need for lift equipment (will need Maximove)  Activity Tolerance: Patient tolerated treatment well Patient left: in chair;with call bell/phone within reach;with bed alarm set;with family/visitor present  OT Visit Diagnosis: Unsteadiness on feet (R26.81);Other abnormalities of gait and mobility (R26.89);Muscle weakness (generalized) (M62.81);History of falling (Z91.81);Other symptoms and signs involving the nervous system (R29.898);Other symptoms and signs involving cognitive function;Hemiplegia and hemiparesis Hemiplegia - Right/Left: Left Hemiplegia - dominant/non-dominant: Non-Dominant Hemiplegia - caused by: Cerebral infarction                Time: 3546-5681 OT Time Calculation (min): 39 min Charges:  OT General Charges $OT Visit: 1 Visit OT Evaluation $OT Eval Moderate Complexity: 1 Mod  Erskine Steinfeldt, OT/L   Acute OT Clinical Specialist Acute Rehabilitation Services Pager [phone] Office [phone]   [facility] 06/12/2022, 3:08 PM

## 2022-06-12 NOTE — Evaluation (Addendum)
Physical Therapy Evaluation Patient Details Name: [name] MRN: [id number] DOB: 1950/06/29 Today's Date: 06/12/2022  History of Present Illness  Pt is a 71 y/o female admitted 12/27 via EMS with L sided weakness.  Also noted R gaze preference, left side flaccid.with neglect and Left field cut.  Pt s/p endovascular revascularization of R MCA achieving a TICI3 revascularization.  PMH venous stasis, HTN, DM2, morbid obesity,  wheelchair use, gout, chronic back pain, HLD , OA, bipolar d/o, PVD bil LE's  Clinical Impression  Pt admitted with/for s/s of stroke described above with full revascularization.  Pt needing max to total assist of 2 persons for basic mobility and was unable to stand today..  Pt currently limited functionally due to the problems listed. ( See problems list.)   Pt will benefit from PT to maximize function and safety in order to get ready for next venue listed below.        Recommendations for follow up therapy are one component of a multi-disciplinary discharge planning process, led by the attending physician.  Recommendations may be updated based on patient status, additional functional criteria and insurance authorization.  Follow Up Recommendations Skilled nursing-short term rehab (<3 hours/day) Can patient physically be transported by private vehicle: No    Assistance Recommended at Discharge Frequent or constant Supervision/Assistance  Patient can return home with the following  A lot of help with walking and/or transfers;A lot of help with bathing/dressing/bathroom;Assistance with cooking/housework;Assist for transportation;Help with stairs or ramp for entrance    Equipment Recommendations None recommended by PT;Other (comment) (TBD)  Recommendations for Other Services       Functional Status Assessment Patient has had a recent decline in their functional status and demonstrates the ability to make significant improvements in function in a reasonable and  predictable amount of time.     Precautions / Restrictions Precautions Precautions: Fall      Mobility  Bed Mobility Overal bed mobility: Needs Assistance Bed Mobility: Rolling, Supine to Sit Rolling: Max assist, +2 for physical assistance   Supine to sit: Max assist, +2 for physical assistance     General bed mobility comments: truncal assist given at L scapula and hip to roll and 2 person assist up via R UE with padding to assist pivot of L side to scoot and square up at EOB    Transfers Overall transfer level: Needs assistance Equipment used: 2 person hand held assist Transfers: Sit to/from Stand Sit to Stand: Total assist, +2 physical assistance           General transfer comment: unable to clear hip off bed with significant assist bilaterally    Ambulation/Gait               General Gait Details: unable, used power w/c at baseline  Stairs            Wheelchair Mobility    Modified Rankin (Stroke Patients Only) Modified Rankin (Stroke Patients Only) Pre-Morbid Rankin Score: Moderately severe disability Modified Rankin: Severe disability     Balance Overall balance assessment: Needs assistance Sitting-balance support: Single extremity supported, Feet supported Sitting balance-Leahy Scale: Poor Sitting balance - Comments: reliant on UE assist or external support                                     Pertinent Vitals/Pain      Home Living Family/patient expects to be discharged to::  Private residence Living Arrangements: Spouse/significant other Available Help at Discharge: Available 24 hours/day Type of Home: Apartment Home Access: Ramped entrance       Home Layout: One level Home Equipment: Wheelchair - power;Wheelchair - manual;Transport chair;Hospital bed;Grab bars - toilet;Grab bars - tub/shower;Hand held Armed forces logistics/support/administrative officer (2 wheels) Additional Comments: transfers only; has not "walked in years";     Prior Function Prior Level of Function : Needs assist       Physical Assist : ADLs (physical) (husband assist with putting socks on)   ADLs (physical): IADLs         Hand Dominance   Dominant Hand: Right    Extremity/Trunk Assessment   Upper Extremity Assessment Upper Extremity Assessment: Defer to OT evaluation    Lower Extremity Assessment Lower Extremity Assessment: Generalized weakness;LLE deficits/detail LLE Deficits / Details: significant weakness >R LE LLE: Unable to fully assess due to pain LLE Coordination: decreased fine motor    Cervical / Trunk Assessment Cervical / Trunk Assessment: Kyphotic  Communication   Communication: No difficulties  Cognition Arousal/Alertness: Awake/alert Behavior During Therapy: Impulsive Overall Cognitive Status: Impaired/Different from baseline                                          General Comments General comments (skin integrity, edema, etc.): VSS    Exercises     Assessment/Plan    PT Assessment Patient needs continued PT services  PT Problem List Decreased strength;Decreased activity tolerance;Decreased balance;Decreased mobility;Decreased coordination;Obesity;Pain       PT Treatment Interventions Functional mobility training;Therapeutic activities;DME instruction;Therapeutic exercise;Balance training;Neuromuscular re-education;Patient/family education    PT Goals (Current goals can be found in the Care Plan section)  Acute Rehab PT Goals Patient Stated Goal: back to PLOF PT Goal Formulation: With patient Time For Goal Achievement: 06/26/22 Potential to Achieve Goals: Fair    Frequency       Co-evaluation PT/OT/SLP Co-Evaluation/Treatment: Yes Reason for Co-Treatment: Complexity of the patient's impairments (multi-system involvement) PT goals addressed during session: Mobility/safety with mobility         AM-PAC PT "6 Clicks" Mobility  Outcome Measure Help needed turning from  your back to your side while in a flat bed without using bedrails?: A Lot Help needed moving from lying on your back to sitting on the side of a flat bed without using bedrails?: Total Help needed moving to and from a bed to a chair (including a wheelchair)?: Total Help needed standing up from a chair using your arms (e.g., wheelchair or bedside chair)?: Total Help needed to walk in hospital room?: Total Help needed climbing 3-5 steps with a railing? : Total 6 Click Score: 7    End of Session   Activity Tolerance: Patient limited by fatigue Patient left: in bed;with call bell/phone within reach;with bed alarm set;with family/visitor present Nurse Communication: Mobility status PT Visit Diagnosis: Other abnormalities of gait and mobility (R26.89);Other symptoms and signs involving the nervous system (R29.898);Pain Pain - part of body:  (bil knees)    Time: 2878-6767 PT Time Calculation (min) (ACUTE ONLY): 31 min   Charges:   PT Evaluation $PT Eval Moderate Complexity: 1 Mod          06/12/2022  Jacinto Halim., PT Acute Rehabilitation Services [phone]  (office)  [name] 06/12/2022, 2:38 PM

## 2022-06-12 NOTE — [place] FL2 (Signed)
[place] MEDICAID FL2 LEVEL OF CARE FORM     IDENTIFICATION  Patient Name: [name] Birthdate: September 01, 1950 Sex: female Admission Date (Current Location): 06/11/2022  [facility] and [place] Number:  Producer, television/film/video and Address:  [facility], 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   Wound infection 02/05/2021   Venous stasis dermatitis of both lower extremities    Pneumonia due to COVID-19 virus 06/29/2019   Hypophosphatemia 06/24/2019   Morbidly obese (HCC) 06/23/2019   Diabetes mellitus type 2, uncontrolled, with complications 06/22/2019   Anxiety 06/22/2019   Depressive disorder 06/22/2019   Essential hypertension 06/22/2019   HLD (hyperlipidemia) 06/22/2019   COVID-19 06/21/2019   Severe protein-calorie malnutrition (HCC) 06/21/2019   Acute respiratory failure with hypoxia (HCC) 06/21/2019   Diabetic foot ulcer associated with type 2 diabetes mellitus (HCC)    Cellulitis 07/05/2016   Depression with anxiety 05/30/2016   Chronic skin ulcer of lower leg (HCC) 05/30/2016   Generalized weakness 05/30/2016   Left arm weakness 05/30/2016   Left Lower extremity pain  04/02/2016   Lower extremity pain, inferior, left 04/02/2016   Left leg pain    Personal history of noncompliance with medical treatment, presenting hazards to health 03/31/2016   Jaw pain 03/27/2016   Left leg cellulitis 03/24/2016   Cellulitis of  left lower extremity    Primary insomnia    Adjustment disorder    Morbid obesity (HCC) 01/27/2016   Wheelchair dependent 01/27/2016   Gout 01/27/2016   Asthma 01/27/2016   Arthritis 01/27/2016   Uncontrolled diabetes mellitus type 2 with peripheral artery disease 01/25/2016   Cellulitis of left leg 01/11/2016   Insulin-requiring or dependent type II diabetes mellitus (HCC) 01/11/2016   Hypertension 01/11/2016    Orientation RESPIRATION BLADDER Height & Weight     Self, Time, Situation, Place  Normal Continent, Indwelling catheter Weight: 283 lb 1.1 oz (128.4 kg) Height:     BEHAVIORAL SYMPTOMS/MOOD NEUROLOGICAL BOWEL NUTRITION STATUS      Continent Diet (See dc summary)  AMBULATORY STATUS COMMUNICATION OF NEEDS Skin   Extensive Assist Verbally Other (Comment) (venous stasis ulcer on foot and ankle w/guaze)                       Personal Care Assistance Level of Assistance  Bathing, Feeding, Dressing Bathing Assistance: Maximum assistance Feeding assistance: Limited assistance Dressing Assistance: Maximum assistance     Functional Limitations Info  Sight Sight Info: Impaired        SPECIAL CARE FACTORS FREQUENCY  PT (By licensed PT), OT (By licensed OT), Speech therapy     PT Frequency: 5x/week OT Frequency: 5x/week     Speech Therapy Frequency: 2x/week      Contractures Contractures Info: Not present    Additional Factors Info  Code Status, Allergies, Insulin Sliding Scale Code Status Info: Full Allergies Info: Ace Inhibitors, Latex, Tizanidine, Tramadol,  Metformin And Related, Propofol   Insulin Sliding Scale Info: See dc summary       Current Medications (06/12/2022):  This is the current hospital active medication list Current Facility-Administered Medications  Medication Dose Route Frequency Provider Last Rate Last Admin   0.9 %  sodium chloride infusion   Intravenous Continuous Pamalee Leyden, Devon, NP       0.9 %  sodium chloride infusion    Intravenous Continuous Deveshwar, Simonne Maffucci, MD   Paused at 06/12/22 0145   acetaminophen (TYLENOL) tablet 650 mg  650 mg Oral Q4H PRN Elmer Picker, NP       Or   acetaminophen (TYLENOL) 160 MG/5ML solution 650 mg  650 mg Per Tube Q4H PRN Elmer Picker, NP       Or   acetaminophen (TYLENOL) suppository 650 mg  650 mg Rectal Q4H PRN Elmer Picker, NP       atenolol (TENORMIN) tablet 50 mg  50 mg Oral Daily Gevena Mart A, NP       Chlorhexidine Gluconate Cloth 2 % PADS 6 each  6 each Topical Daily Rejeana Brock, MD   6 each at 06/12/22 1000   clevidipine (CLEVIPREX) infusion 0.5 mg/mL  0-32 mg/hr Intravenous Continuous Milon Dikes, MD 30 mL/hr at 06/12/22 1400 15 mg/hr at 06/12/22 1400   dextrose 50 % solution 0-50 mL  0-50 mL Intravenous PRN Rejeana Brock, MD       [START ON 06/13/2022] heparin injection 5,000 Units  5,000 Units Subcutaneous Q8H Gevena Mart A, NP       insulin aspart (novoLOG) injection 3-9 Units  3-9 Units Subcutaneous Q4H Marvel Plan, MD       insulin detemir (LEVEMIR) injection 24 Units  24 Units Subcutaneous Q12H Marvel Plan, MD   24 Units at 06/12/22 7588   labetalol (NORMODYNE) injection 10 mg  10 mg Intravenous Q10 min PRN Rejeana Brock, MD       leptospermum manuka honey (MEDIHONEY) paste 1 Application  1 Application Topical Daily Milon Dikes, MD   1 Application at 06/12/22 1400   nitroGLYCERIN 25 mcg in sodium chloride (PF) 0.9 % 59.71 mL (25 mcg/mL) syringe   Intra-arterial PRN Julieanne Cotton, MD   25 mcg at 06/11/22 1334   Oral care mouth rinse  15 mL Mouth Rinse PRN Rejeana Brock, MD       potassium chloride SA (KLOR-CON M) CR tablet 40 mEq  40 mEq Oral BID Marvel Plan, MD   40 mEq at 06/12/22 1253   Radial Cocktail (customizable)   Intra-arterial PRN Julieanne Cotton, MD   Given at 06/11/22 1238   Radial Cocktail (customizable)   Intra-arterial PRN Julieanne Cotton, MD   Given at 06/11/22 1338   senna-docusate  (Senokot-S) tablet 1 tablet  1 tablet Oral QHS PRN Elmer Picker, NP       sodium chloride flush (NS) 0.9 % injection 3 mL  3 mL Intravenous Once Milon Dikes, MD         Discharge Medications: Please see discharge summary for a list of discharge medications.  Relevant Imaging Results:  Relevant Lab Results:   Additional Information SS# [id number]  Mearl Latin, LCSW

## 2022-06-12 NOTE — Progress Notes (Signed)
Stroke education given to pt and husband. Discussed BEFAST and 911 activation.Stroke Education  Handbook given.

## 2022-06-12 NOTE — Progress Notes (Addendum)
STROKE TEAM PROGRESS NOTE   INTERVAL HISTORY No family at the bedside. She is on cleviprex @ 24mg  and insulin gtt which is being transitioned off . Will restart her home atenolol 50mg  today   LE [place] ordered to r/o DVT.   Potentially may need loop recorder prior to discharge.   She is awake and alert, oriented to place, age, month and year. Visual field full to counting, EOMI.  Left facial droop,  no aphasia or dysarthria, tongue is midline. Left arm antigravity but drifts. minimal movement inl left fingers. Left leg 2/5, horizontal movement and minimal ability to lift. Bilateral legs wrapped in ace bandage due to open wounds  Vitals:   06/12/22 0645 06/12/22 0700 06/12/22 0715 06/12/22 0800  BP:  125/69  119/68  Pulse: 85 83 85 82  Resp: (!) 31 20 (!) 25 18  Temp:      TempSrc:      SpO2: 95% 97% 94% 97%  Weight:       CBC:  Recent Labs  Lab 06/11/22 1240 06/11/22 1243 06/12/22 0527  WBC 14.8*  --  19.0*  NEUTROABS 13.5*  --  16.7*  HGB 11.1* 13.3 11.4*  HCT 37.5 39.0 34.7*  MCV 86.6  --  83.4  PLT 324  --  [id number]   Basic Metabolic Panel:  Recent Labs  Lab 06/11/22 1240 06/11/22 1243 06/12/22 0527  NA 133* 136 138  K 3.9 4.0 3.3*  CL 102 102 108  CO2 17*  --  22  GLUCOSE 367* 375* 136*  BUN 8 9 7*  CREATININE 0.95 0.60 0.85  CALCIUM 8.7*  --  8.9   Lipid Panel:  Recent Labs  Lab 06/12/22 0527  CHOL 112  TRIG 108  HDL 45  CHOLHDL 2.5  VLDL 22  LDLCALC 45   HgbA1c:  Recent Labs  Lab 06/11/22 1240  HGBA1C 9.5*   Urine Drug Screen: No results for input(s): "LABOPIA", "COCAINSCRNUR", "LABBENZ", "AMPHETMU", "THCU", "LABBARB" in the last 168 hours.  Alcohol Level  Recent Labs  Lab 06/11/22 1240  ETH <10    IMAGING past 24 hours MR BRAIN WO CONTRAST  Result Date: 06/12/2022 CLINICAL DATA:  Follow-up examination for stroke. EXAM: MRI HEAD WITHOUT CONTRAST TECHNIQUE: Multiplanar, multiecho pulse sequences of the brain and surrounding structures were  obtained without intravenous contrast. COMPARISON:  Prior studies from 06/11/2022. FINDINGS: Brain: Examination moderately degraded by motion artifact. Cerebral volume within normal limits. Scattered patchy T2/FLAIR hyperintensity involving the supratentorial cerebral white matter, nonspecific, but most commonly related to chronic microvascular ischemic disease. Mild patchy involvement of the pons. Appearance is mild for age. Restricted diffusion involving the anterior right frontal lobe, most pronounced at the right frontal operculum, consistent with acute right MCA distribution infarct. Associated petechial blood products without frank hemorrhagic transformation Heidelberg classification 1a: HI1, scattered small petechiae, no mass effect. No other evidence for acute or subacute ischemia. No other acute or chronic intracranial blood products. No mass lesion, midline shift or mass effect. No hydrocephalus or extra-axial fluid collection. Pituitary gland and suprasellar region within normal limits. Vascular: Major intracranial vascular flow voids are grossly maintained. Skull and upper cervical spine: Craniocervical junction grossly within normal limits. Diffuse loss of normal bone marrow signal, nonspecific but can be seen with anemia, smoking, obesity, and infiltrative/myelofibrotic marrow processes. No scalp soft tissue abnormality. Sinuses/Orbits: Globes and orbital soft tissues grossly within normal limits. Paranasal sinuses are largely clear. Small bilateral mastoid effusions noted. Negative nasopharynx. Other: None. IMPRESSION:  1. Motion degraded exam. 2. 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 Associated petechial blood products without frank hemorrhagic transformation or significant mass effect. Heidelberg classification 1a: HI1, scattered small petechiae, no mass effect. 3. Underlying mild chronic microvascular ischemic disease. Electronically Signed   By: Jeannine Boga M.D.   On: 06/12/2022 02:54   CT ANGIO HEAD NECK W WO CM W PERF (CODE STROKE)  Addendum Date: 06/11/2022   ADDENDUM REPORT: 06/11/2022 20:25 ADDENDUM: CTA head and CT perfusion head results communicated to Dr. Rory Percy by telephone at 12:32 p.m. on 06/11/2022. Electronically Signed   By: Kellie Simmering D.O.   On: 06/11/2022 20:25   Result Date: 06/11/2022 CLINICAL DATA:  Provided history: Neuro deficit, acute, stroke suspected. EXAM: CT ANGIOGRAPHY HEAD AND NECK CT PERFUSION BRAIN TECHNIQUE: Multidetector CT imaging of the head and neck was performed using the standard protocol during bolus administration of intravenous contrast. Multiplanar CT image reconstructions and MIPs were obtained to evaluate the vascular anatomy. Carotid stenosis measurements (when applicable) are obtained utilizing NASCET criteria, using the distal internal carotid diameter as the denominator. Multiphase CT imaging of the brain was performed following IV bolus contrast injection. Subsequent parametric perfusion maps were calculated using RAPID software. RADIATION DOSE REDUCTION: This exam was performed according to the departmental dose-optimization program which includes automated exposure control, adjustment of the mA and/or kV according to patient size and/or use of iterative reconstruction technique. CONTRAST:  163mL OMNIPAQUE IOHEXOL 350 MG/ML SOLN COMPARISON:  Non-contrast head CT performed earlier today 06/11/2022. FINDINGS: CTA NECK FINDINGS Aortic arch: Standard aortic branching. The visualized aortic arch is normal in caliber. Streak and beam hardening artifact arising from a dense right-sided contrast bolus partially obscures the right subclavian artery. Within this limitation, there is no appreciable hemodynamically significant innominate or proximal subclavian artery stenosis. Right carotid system: CCA and ICA patent within the neck without stenosis or significant atherosclerotic disease. No evidence of  dissection. Left carotid system: CCA and ICA patent within the neck without stenosis or significant atherosclerotic disease. No evidence of dissection. Vertebral arteries: Vertebral arteries codominant and patent within the neck without stenosis or significant atherosclerotic disease. No evidence of dissection. Skeleton: Levocurvature of the cervical spine. Dextrocurvature of the thoracic spine, partially imaged. Cervical spondylosis. No acute fracture or aggressive osseous lesion. Other neck: No neck mass or cervical lymphadenopathy. Upper chest: No consolidation within the imaged lung apices. Review of the MIP images confirms the above findings CTA HEAD FINDINGS Anterior circulation: The intracranial internal carotid arteries are patent. Nonstenotic atherosclerotic plaque within both vessels. The M1 middle cerebral arteries are patent. Abrupt occlusion of a proximal M2 right MCA vessel (for instance as seen on series 15, image 20). No left M2 proximal branch occlusion or high-grade proximal stenosis is identified. The anterior cerebral arteries are patent. No intracranial aneurysm is identified. Posterior circulation: The intracranial vertebral arteries are patent. The basilar artery is patent. The posterior cerebral arteries are patent. Posterior communicating arteries are present bilaterally. Venous sinuses: Within the limitations of contrast timing, no convincing thrombus. Anatomic variants: None significant. Review of the MIP images confirms the above findings CT Brain Perfusion Findings: CBF (<30%) Volume: 82mL (right MCA vascular territory) Perfusion (Tmax>6.0s) volume: 35mL (right MCA vascular territory) Mismatch Volume: 42mL Infarction Location:Right MCA vascular territory. Attempts are being made to reach the ordering provider at this time. IMPRESSION: CTA neck: The common carotid, internal carotid and vertebral arteries are patent within the neck without stenosis or significant  atherosclerotic disease.  No evidence of dissection. CTA head: Abrupt occlusion of a proximal M2 right middle cerebral artery vessel. CT perfusion head: The perfusion software identifies a 26 mL core infarct within the right MCA vascular territory. The perfusion software identifies a 45 mL region of critically hypoperfused parenchyma within the right MCA vascular territory (utilizing the Tmax>6 seconds threshold). Reported mismatch volume: 19 mL. Electronically Signed: By: Kellie Simmering D.O. On: 06/11/2022 12:28   ECHOCARDIOGRAM COMPLETE  Result Date: 06/11/2022    ECHOCARDIOGRAM REPORT   Patient Name:   [name] Date of Exam: 06/11/2022 Medical Rec #:  [id number]       Height:       65.0 in Accession #:    [id number]      Weight:       283.1 lb Date of Birth:  Nov 16, 1950      BSA:          2.293 m Patient Age:    28 years        BP:           131/76 mmHg Patient Gender: F               HR:           82 bpm. Exam Location:  Inpatient Procedure: 2D Echo, Cardiac Doppler and Color Doppler Indications:    Stroke  History:        Patient has no prior history of Echocardiogram examinations.                 Risk Factors:Hypertension and Diabetes.  Sonographer:    Clayton Lefort RDCS (AE) Referring Phys: [id number] [facility]  Sonographer Comments: Patient is obese. IMPRESSIONS  1. Left ventricular ejection fraction, by estimation, is 65 to 70%. The left ventricle has normal function. The left ventricle has no regional wall motion abnormalities. There is mild concentric left ventricular hypertrophy. Left ventricular diastolic parameters were normal.  2. Right ventricular systolic function is normal. The right ventricular size is mildly enlarged. Tricuspid regurgitation signal is inadequate for assessing PA pressure.  3. Left atrial size was mildly dilated.  4. The mitral valve is grossly normal. No evidence of mitral valve regurgitation.  5. The aortic valve is tricuspid. There is mild calcification of the aortic valve. There is mild  thickening of the aortic valve. Aortic valve regurgitation is not visualized. Aortic valve sclerosis/calcification is present, without any evidence of aortic stenosis.  6. Aortic dilatation noted. There is borderline dilatation of the ascending aorta, measuring 39 mm.  7. The inferior vena cava is normal in size with greater than 50% respiratory variability, suggesting right atrial pressure of 3 mmHg. Comparison(s): No prior Echocardiogram. FINDINGS  Left Ventricle: Left ventricular ejection fraction, by estimation, is 65 to 70%. The left ventricle has normal function. The left ventricle has no regional wall motion abnormalities. The left ventricular internal cavity size was normal in size. There is  mild concentric left ventricular hypertrophy. Left ventricular diastolic parameters were normal. Right Ventricle: The right ventricular size is mildly enlarged. No increase in right ventricular wall thickness. Right ventricular systolic function is normal. Tricuspid regurgitation signal is inadequate for assessing PA pressure. Left Atrium: Left atrial size was mildly dilated. Right Atrium: Right atrial size was normal in size. Pericardium: There is no evidence of pericardial effusion. Mitral Valve: The mitral valve is grossly normal. There is mild thickening of the mitral valve leaflet(s). There is mild calcification of the mitral valve  leaflet(s). Mild to moderate mitral annular calcification. No evidence of mitral valve regurgitation. Tricuspid Valve: The tricuspid valve is normal in structure. Tricuspid valve regurgitation is trivial. Aortic Valve: The aortic valve is tricuspid. There is mild calcification of the aortic valve. There is mild thickening of the aortic valve. Aortic valve regurgitation is not visualized. Aortic valve sclerosis/calcification is present, without any evidence of aortic stenosis. Aortic valve mean gradient measures 6.0 mmHg. Aortic valve peak gradient measures 11.3 mmHg. Aortic valve area,  by VTI measures 2.75 cm. Pulmonic Valve: The pulmonic valve was normal in structure. Pulmonic valve regurgitation is trivial. Aorta: Aortic dilatation noted and the aortic root is normal in size and structure. There is borderline dilatation of the ascending aorta, measuring 39 mm. Venous: The inferior vena cava is normal in size with greater than 50% respiratory variability, suggesting right atrial pressure of 3 mmHg. IAS/Shunts: The atrial septum is grossly normal.  LEFT VENTRICLE PLAX 2D LVIDd:         5.00 cm   Diastology LVIDs:         2.60 cm   LV e' medial:    7.51 cm/s LV PW:         0.90 cm   LV E/e' medial:  13.7 LV IVS:        0.90 cm   LV e' lateral:   9.90 cm/s LVOT diam:     2.20 cm   LV E/e' lateral: 10.4 LV SV:         91 LV SV Index:   40 LVOT Area:     3.80 cm  RIGHT VENTRICLE             IVC RV Basal diam:  3.60 cm     IVC diam: 1.30 cm RV Mid diam:    3.90 cm RV S prime:     17.20 cm/s TAPSE (M-mode): 3.1 cm LEFT ATRIUM             Index        RIGHT ATRIUM           Index LA diam:        4.10 cm 1.79 cm/m   RA Area:     12.60 cm LA Vol (A2C):   77.9 ml 33.98 ml/m  RA Volume:   27.00 ml  11.78 ml/m LA Vol (A4C):   54.6 ml 23.81 ml/m LA Biplane Vol: 67.2 ml 29.31 ml/m  AORTIC VALVE AV Area (Vmax):    2.85 cm AV Area (Vmean):   2.73 cm AV Area (VTI):     2.75 cm AV Vmax:           168.00 cm/s AV Vmean:          113.000 cm/s AV VTI:            0.330 m AV Peak Grad:      11.3 mmHg AV Mean Grad:      6.0 mmHg LVOT Vmax:         126.00 cm/s LVOT Vmean:        81.200 cm/s LVOT VTI:          0.239 m LVOT/AV VTI ratio: 0.72  AORTA Ao Root diam: 2.60 cm Ao Asc diam:  3.90 cm MITRAL VALVE MV Area (PHT): 2.54 cm     SHUNTS MV Decel Time: 299 msec     Systemic VTI:  0.24 m MV E velocity: 103.00 cm/s  Systemic Diam: 2.20 cm MV A  velocity: 124.00 cm/s MV E/A ratio:  0.83 Gwyndolyn Kaufman MD Electronically signed by Gwyndolyn Kaufman MD Signature Date/Time: 06/11/2022/5:36:33 PM    Final    CT HEAD  CODE STROKE WO CONTRAST  Result Date: 06/11/2022 CLINICAL DATA:  Code stroke.  Neuro deficit, acute, stroke suspected EXAM: CT HEAD WITHOUT CONTRAST TECHNIQUE: Contiguous axial images were obtained from the base of the skull through the vertex without intravenous contrast. RADIATION DOSE REDUCTION: This exam was performed according to the departmental dose-optimization program which includes automated exposure control, adjustment of the mA and/or kV according to patient size and/or use of iterative reconstruction technique. COMPARISON:  CT head 03/15/2020. FINDINGS: Brain: Asymmetric hypoattenuation and loss of gray differentiation involving the right insula, concerning for right MCA territory infarct. No evidence of acute hemorrhage, hydrocephalus, extra-axial collection or mass lesion/mass effect. Vascular: No definite hyperdense vessel identified. Skull: No acute fracture. Sinuses/Orbits: Clear sinuses.  No acute orbital findings. ASPECTS ([place] Stroke Program Early CT Score): 7 IMPRESSION: 1. Asymmetric hypoattenuation and loss of gray differentiation involving the right insula and overlying operculum, concerning for acute right MCA territory infarct. Recommend MRI. 2. ASPECTS is 7. Code stroke imaging results were communicated on 06/11/2022 at 11:36 am to provider Dr. Rory Percy via telephone, who verbally acknowledged these results. Electronically Signed   By: Margaretha Sheffield M.D.   On: 06/11/2022 11:36    PHYSICAL EXAM  Temp:  [96.2 F (35.7 C)-98.5 F (36.9 C)] 98.2 F (36.8 C) (12/28 0400) Pulse Rate:  [67-98] 85 (12/28 1400) Resp:  [0-31] 22 (12/28 1445) BP: (115-151)/(54-90) 140/74 (12/28 1445) SpO2:  [91 %-100 %] 96 % (12/28 1400) Arterial Line BP: (105-152)/(47-83) 105/70 (12/28 0900)  General - Well nourished, well developed, in no apparent distress. Cardiovascular - Regular rhythm and rate.  Mental Status -  She is awake and alert, oriented to place, age, month and year. Visual  field full to counting, EOMI.  Left facial droop,  no aphasia or dysarthria, tongue is midline. Left arm antigravity but drifts. minimal movement inl left fingers. Left leg 2/5, horizontal movement and minimal ability to lift. Bilateral legs wrapped in ace bandage due to open wounds  ASSESSMENT/PLAN Ms. FREDDA GRAEBNER is a 71 y.o. female with history of  DM2, TIA, osteoarthritis, HLD, migraine, HTN, GOUT, depression, asthma, arthritis, bipolar disorder, PVD of bilateral lower extremities presenting via EMS with left side weakness. EMS was initially dispatched for lift assist after she slid out of bed. They noted a right gaze preference, left side flaccid and activated code stroke. BP on arrival 160/70 and glucose 325.  She was emergently taken to IR for a mechanical thrombectomy of her right M2. She was outside of the window for thrombolytic therapy.   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 Cardioembolic source  Code Stroke CT head Asymmetric hypoattenuation and loss of gray differentiation involving the right insula and overlying operculum, concerning for acute right MCA territory infarct.ASPECTS 7   CTA head & neck  Abrupt occlusion of a proximal M2 right middle cerebral artery vessel. The common carotid, internal carotid and vertebral arteries are patent within the neck without stenosis or significant atherosclerotic disease. No evidence of dissection. CT perfusion 26/45 IR - Status post endovascular revascularization of right middle cerebral artery achieving a TICI 3  revascularization.  Post IR CT minimal subarachnoid hyperattenuation in the distal sylvian fissure.  MRI  Acute R MCA ischemic infarct with petechial hemorrhage LE [place] no DVT  2D Echo EF 65-70% Left atrial size was mildly dilated  Recommend loop recorder prior to discharge  LDL 45 HgbA1c 9.5 VTE prophylaxis - SCD's No  antithrombotic prior to admission, now on aspirin 81 mg daily and plavix DAPT for 3 weeks and then ASA alone.  Therapy recommendations:  SNF Disposition:  pending   Hypertensive emergency  Home meds:  atenolol Unstable on cleviprex gtt BP 120-140 for 24 hrs post thrombectomy then BP goal < 180 Will restart home atenolol today  Long-term BP goal normotensive  Lipid management Home meds:  none LDL 45, goal < 70 High intensity statin not indicated due to below goal   Diabetes type II uncontrolled hyperglycemia Home meds:  glipizide, lantus  HgbA1c 9.5, goal < 7.0 CBGs On insulin gtt, being transitioned off to SSI Will need close PCP follow up with PCP for diabetes management after discharge  Diabetes consult   Other Stroke Risk Factors Advanced Age >/= 79  Obesity, Body mass index is 47.11 kg/m., BMI >/= 30 associated with increased stroke risk, recommend weight loss, diet and exercise as appropriate  Hx stroke/TIA Potential Obstructive sleep apnea, recommend outpatient sleep study   Other Active Problems Anxiety and depression  Bipolar disorder Bilateral open wounds- wound consult  Hypokalemia K 3.3- replaced. Will check in am  Leukocytosis - WBC 19.0. Afebrile. Will monitor, likely reactive.( Received decadron 10mg )  Will check in am   Hospital day # 1  DNP, ACNPC-AG  [facility]  ATTENDING NOTE: I reviewed above note and agree with the assessment and plan. Pt was seen and examined.   71 year old female with history of diabetes, hypertension, hyperlipidemia, PVD, TIA, bipolar disorder admitted for left-sided weakness, right gaze, left hemianopia, left neglect and left facial droop with elevated glucose level.  CT showed right MCA already ischemic changes.  CT head and neck right M2 occlusion.  CTP 26/45.  Status post IR with TICI3.  EF 65 to 70%.  LE venous Doppler negative for DVT.  MRI showed right MCA moderate-sized infarct.  LDL 45, A1c 9.5.   Creatinine 0.85, WBC 10.0.  Persistent hyperglycemia.  On exam, no family at bedside.  Patient still has left facial droop and left hemiparesis.  Otherwise AOx3, no aphasia, follows simple commands.  Etiology for patient's stroke likely due to large vessel disease given uncontrolled risk factors versus cardioembolic source especially occult A-fib.  Recommend loop recorder prior to discharge to rule out A-fib.  Will put on aspirin and Plavix DAPT for 3 weeks and then aspirin alone.  No statin needed at this time given LDL at goal.  Patient does have leukocytosis, but afebrile, will monitoring.  And hypokalemia, will give supplement and monitoring.  Plan to change insulin drip to Levemir.  Passed swallow, encourage p.o. intake to taper off IV fluid.  Still on Cleviprex, taper off after 24 hours of IR.  Resume home atenolol and other medications.  PT/OT recommend SNF.  For detailed assessment and plan, please refer to above/below as I have made changes wherever appropriate.   63, MD PhD Stroke Neurology 06/12/2022 6:30 PM  This patient is critically ill due to right MCA stroke, right M2 occlusion status post thrombectomy, hyperglycemia needing insulin drip and at significant risk of neurological worsening, death form recurrent stroke, hemorrhagic transformation, DKA, seizure. This patient's care requires constant monitoring of vital signs, hemodynamics, respiratory and cardiac monitoring, review of  multiple databases, neurological assessment, discussion with family, other specialists and medical decision making of high complexity. I spent 40 minutes of neurocritical care time in the care of this patient.   To contact Stroke Continuity provider, please refer to [email or web address] After hours, contact General Neurology

## 2022-06-13 DIAGNOSIS — I63511 Cerebral infarction due to unspecified occlusion or stenosis of right middle cerebral artery: Secondary | ICD-10-CM | POA: Diagnosis not present

## 2022-06-13 LAB — CBC
HCT: 33.1 % — ABNORMAL LOW (ref 36.0–46.0)
Hemoglobin: 10.7 g/dL — ABNORMAL LOW (ref 12.0–15.0)
MCH: 27 pg (ref 26.0–34.0)
MCHC: 32.3 g/dL (ref 30.0–36.0)
MCV: 83.4 fL (ref 80.0–100.0)
Platelets: 321 10*3/uL (ref 150–400)
RBC: 3.97 MIL/uL (ref 3.87–5.11)
RDW: 17.2 % — ABNORMAL HIGH (ref 11.5–15.5)
WBC: 11.3 10*3/uL — ABNORMAL HIGH (ref 4.0–10.5)
nRBC: 0 % (ref 0.0–0.2)

## 2022-06-13 LAB — BASIC METABOLIC PANEL
Anion gap: 6 (ref 5–15)
BUN: 7 mg/dL — ABNORMAL LOW (ref 8–23)
CO2: 23 mmol/L (ref 22–32)
Calcium: 8.7 mg/dL — ABNORMAL LOW (ref 8.9–10.3)
Chloride: 108 mmol/L (ref 98–111)
Creatinine, Ser: 0.87 mg/dL (ref 0.44–1.00)
GFR, Estimated: >60 mL/min (ref >60)
Glucose, Bld: 96 mg/dL (ref 70–99)
Potassium: 3.9 mmol/L (ref 3.5–5.1)
Sodium: 137 mmol/L (ref 135–145)

## 2022-06-13 LAB — GLUCOSE, CAPILLARY
Glucose-Capillary: 87 mg/dL (ref 70–99)
Glucose-Capillary: 94 mg/dL (ref 70–99)
Glucose-Capillary: 154 mg/dL — ABNORMAL HIGH (ref 70–99)
Glucose-Capillary: 154 mg/dL — ABNORMAL HIGH (ref 70–99)
Glucose-Capillary: 106 mg/dL — ABNORMAL HIGH (ref 70–99)

## 2022-06-13 LAB — MAGNESIUM: Magnesium: 1.6 mg/dL — ABNORMAL LOW (ref 1.7–2.4)

## 2022-06-13 MED ORDER — POTASSIUM CHLORIDE CRYS ER 20 MEQ PO TBCR
20.0000 meq | EXTENDED_RELEASE_TABLET | Freq: Two times a day (BID) | ORAL | Status: AC
  Administered 2022-06-13 (×2): 20 meq via ORAL
  Filled 2022-06-13 (×2): qty 1

## 2022-06-13 MED ORDER — ORAL CARE MOUTH RINSE
15.0000 mL | OROMUCOSAL | Status: DC
  Administered 2022-06-13 – 2022-06-16 (×11): 15 mL via OROMUCOSAL

## 2022-06-13 MED ORDER — MAGNESIUM OXIDE -MG SUPPLEMENT 400 (240 MG) MG PO TABS
200.0000 mg | ORAL_TABLET | Freq: Two times a day (BID) | ORAL | Status: AC
  Administered 2022-06-13 (×2): 200 mg via ORAL
  Filled 2022-06-13 (×2): qty 1

## 2022-06-13 MED ORDER — ORAL CARE MOUTH RINSE
15.0000 mL | OROMUCOSAL | Status: DC
  Administered 2022-06-13: 15 mL via OROMUCOSAL

## 2022-06-13 MED ORDER — ORAL CARE MOUTH RINSE: 15.0000 mL | OROMUCOSAL | Status: DC | PRN

## 2022-06-13 NOTE — Progress Notes (Signed)
Speech Language Pathology Treatment: Dysphagia;Cognitive-Linquistic  Patient Details Name: [name] MRN: [id number] DOB: 1950/11/01 Today's Date: 06/13/2022 Time: 7829-5621 SLP Time Calculation (min) (ACUTE ONLY): 19 min  Assessment / Plan / Recommendation Clinical Impression  Pt was seen with her breakfast tray, reporting that it is "a lot harder to chew." Nursing also says that they have been seeing a lot of pocketing, although the pt can be cued to clear it. Upon SLP arrival, pt has a lot of food pocketed on her L side with more mild amount of residue outwardly from anterior spillage. Pt required prolonged time and Mod cues to clear food from her buccal cavity. Pt acknowledged that she took too large of a bite; also cued pt to clear her mouth completely before taking her next bite. These strategies, in addition to liquid washes and lingual sweeps, were helpful in improving oral clearance. No overt s/s of aspiration were noted. Recommend continuing on Dys 2 diet and thin liquids but staff could consider downgrade to purees if chopped diet appears to be too effortful.   Cognitively, pt required Mod cues for sustained attention to task and simple conversation throughout session. She placed her lunch order at the beginning of this session, and 5 or so minutes later started commenting that she needed to order her lunch. She said she had no recall of ordering, but when given cues to include multiple choice options, she remembered everything that she had ordered. She does not think this is acutely worsened from her baseline memory, although in light of reduced awareness from acute R MCA stroke, it is possible that she may not be as aware of acute changes. Husband present in the room but on the phone so did not comment on function. Recommend SNF level of care for follow up.    HPI HPI: Pt is a 71 yo female presenting 12/27 with L weakness and facial droop noted after fall from bed. Pt s/p  revascularization of R MCA. MRI shows an acute R MCA distribution infarct involving the anterior R frontal lobe. PMH includes: HH, HTN, TIA, diabetes, migraine, anxiety, depressive disorder, mixed HLD      SLP Plan  Continue with current plan of care      Recommendations for follow up therapy are one component of a multi-disciplinary discharge planning process, led by the attending physician.  Recommendations may be updated based on patient status, additional functional criteria and insurance authorization.    Recommendations  Diet recommendations: Dysphagia 2 (fine chop);Thin liquid Liquids provided via: Cup;Straw Medication Administration: Crushed with puree Supervision: Staff to assist with self feeding;Full supervision/cueing for compensatory strategies Compensations: Minimize environmental distractions;Slow rate;Small sips/bites;Monitor for anterior loss;Lingual sweep for clearance of pocketing Postural Changes and/or Swallow Maneuvers: Seated upright 90 degrees                Oral Care Recommendations: Oral care BID Follow Up Recommendations: Skilled nursing-short term rehab (<3 hours/day) Assistance recommended at discharge: Frequent or constant Supervision/Assistance SLP Visit Diagnosis: Dysphagia, oral phase (R13.11);Cognitive communication deficit (R41.841) Plan: Continue with current plan of care           Mahala Menghini., M.A. CCC-SLP Acute Rehabilitation Services Office [phone]  Secure chat preferred   06/13/2022, 11:06 AM

## 2022-06-13 NOTE — Progress Notes (Signed)
Pt transported to 3w23, patient stated that she will call her son to let her know the room number, receiving RN at bedside, personal items transported with the pt, no concerns noted

## 2022-06-13 NOTE — Progress Notes (Addendum)
STROKE TEAM PROGRESS NOTE   INTERVAL HISTORY Her family is at the bedside. She is awake and alert eating breakfast. Exam improved today. Speech is better today. Left arm able to lift still with drift however improved from yesterday. Slight facial droop  Will transfer to floor today. VSS and stable neurological exam   Vitals:   06/13/22 0700 06/13/22 0800 06/13/22 0900 06/13/22 1000  BP: 138/79 (!) 162/91 (!) 157/80 (!) 150/109  Pulse: 71 (!) 58 71 75  Resp: (!) 24 (!) 22 20 (!) 25  Temp:  97.7 F (36.5 C)    TempSrc:  Axillary    SpO2: 97% 93% 98% 95%  Weight:       CBC:  Recent Labs  Lab 06/11/22 1240 06/11/22 1243 06/12/22 0527 06/13/22 0154  WBC 14.8*  --  19.0* 11.3*  NEUTROABS 13.5*  --  16.7*  --   HGB 11.1*   < > 11.4* 10.7*  HCT 37.5   < > 34.7* 33.1*  MCV 86.6  --  83.4 83.4  PLT 324  --  334 321   < > = values in this interval not displayed.    Basic Metabolic Panel:  Recent Labs  Lab 06/12/22 0527 06/13/22 0154  NA 138 137  K 3.3* 3.9  CL 108 108  CO2 22 23  GLUCOSE 136* 96  BUN 7* 7*  CREATININE 0.85 0.87  CALCIUM 8.9 8.7*  MG  --  1.6*    Lipid Panel:  Recent Labs  Lab 06/12/22 0527  CHOL 112  TRIG 108  HDL 45  CHOLHDL 2.5  VLDL 22  LDLCALC 45    HgbA1c:  Recent Labs  Lab 06/11/22 1240  HGBA1C 9.5*    Urine Drug Screen: No results for input(s): "LABOPIA", "COCAINSCRNUR", "LABBENZ", "AMPHETMU", "THCU", "LABBARB" in the last 168 hours.  Alcohol Level  Recent Labs  Lab 06/11/22 1240  ETH <10     IMAGING past 24 hours VAS [place] LOWER EXTREMITY VENOUS (DVT)  Result Date: 06/12/2022  Lower Venous DVT Study Patient Name:  [name]  Date of Exam:   06/12/2022 Medical Rec #: [id number]        Accession #:    [id number] Date of Birth: 03-09-1951       Patient Gender: F Patient Age:   71 years Exam Location:  [facility] Procedure:      VAS [place] LOWER EXTREMITY VENOUS (DVT) Referring Phys: Scheryl Marten Abrial Arrighi  --------------------------------------------------------------------------------  Indications: Stroke.  Limitations: Body habitus and poor ultrasound/tissue interface. Comparison Study: 07/20/19 prior Performing Technologist: [place] Ponder RVS  Examination Guidelines: A complete evaluation includes B-mode imaging, spectral Doppler, color Doppler, and power Doppler as needed of all accessible portions of each vessel. Bilateral testing is considered an integral part of a complete examination. Limited examinations for reoccurring indications may be performed as noted. The reflux portion of the exam is performed with the patient in reverse Trendelenburg.  +--------+---------------+---------+-----------+----------+--------------------+ RIGHT   CompressibilityPhasicitySpontaneityPropertiesThrombus Aging       +--------+---------------+---------+-----------+----------+--------------------+ CFV     Full           Yes      Yes                                       +--------+---------------+---------+-----------+----------+--------------------+ SFJ     Full                                                              +--------+---------------+---------+-----------+----------+--------------------+  FV Prox Full                                                              +--------+---------------+---------+-----------+----------+--------------------+ FV Mid                 Yes      Yes                                       +--------+---------------+---------+-----------+----------+--------------------+ FV                     Yes      Yes                                       Distal                                                                    +--------+---------------+---------+-----------+----------+--------------------+ PFV     Full                                                               +--------+---------------+---------+-----------+----------+--------------------+ POP     Full           Yes      Yes                                       +--------+---------------+---------+-----------+----------+--------------------+ PTV                    Yes      Yes                  patent by color                                                           doppler              +--------+---------------+---------+-----------+----------+--------------------+ PERO                                                 Not well visualized  +--------+---------------+---------+-----------+----------+--------------------+   +---------+---------------+---------+-----------+----------+-------------------+ LEFT     CompressibilityPhasicitySpontaneityPropertiesThrombus Aging      +---------+---------------+---------+-----------+----------+-------------------+ CFV      Full  Yes      Yes                                      +---------+---------------+---------+-----------+----------+-------------------+ SFJ      Full                                                             +---------+---------------+---------+-----------+----------+-------------------+ FV Prox  Full                                                             +---------+---------------+---------+-----------+----------+-------------------+ FV Mid                  Yes      Yes                                      +---------+---------------+---------+-----------+----------+-------------------+ FV Distal               Yes      Yes                                      +---------+---------------+---------+-----------+----------+-------------------+ PFV      Full                                                             +---------+---------------+---------+-----------+----------+-------------------+ POP      Full           Yes      Yes                                       +---------+---------------+---------+-----------+----------+-------------------+ PTV                                                   Not well visualized +---------+---------------+---------+-----------+----------+-------------------+ PERO                                                  Not well visualized +---------+---------------+---------+-----------+----------+-------------------+     Summary: RIGHT: - There is no evidence of deep vein thrombosis in the lower extremity. However, portions of this examination were limited- see technologist comments above.  - No cystic structure found in the popliteal fossa.  LEFT: - There is no evidence of deep vein thrombosis in the lower extremity. However, portions  of this examination were limited- see technologist comments above.  - No cystic structure found in the popliteal fossa.  *See table(s) above for measurements and observations. Electronically signed by Coral ElseVance Brabham MD on 06/12/2022 at 5:20:30 PM.    Final     PHYSICAL EXAM  Temp:  [97.6 F (36.4 C)-98.3 F (36.8 C)] 97.7 F (36.5 C) (12/29 0800) Pulse Rate:  [58-100] 75 (12/29 1000) Resp:  [0-37] 25 (12/29 1000) BP: (125-162)/(65-109) 150/109 (12/29 1000) SpO2:  [92 %-98 %] 95 % (12/29 1000)  General - Well nourished, well developed, in no apparent distress. Cardiovascular - Regular rhythm and rate.  Mental Status -  She is awake and alert, oriented to place, age, month and year. Visual field full to counting, EOMI.  Left facial droop,  no aphasia or dysarthria, tongue is midline. Left arm antigravity but drifts. minimal movement inl left fingers. Left leg 2/5, horizontal movement and minimal ability to lift. Bilateral legs wrapped in ace bandage due to open wounds  ASSESSMENT/PLAN [name] is a 71 y.o. female with history of  DM2, TIA, osteoarthritis, HLD, migraine, HTN, GOUT, depression, asthma, arthritis, bipolar disorder, PVD of bilateral lower extremities  presenting via EMS with left side weakness. EMS was initially dispatched for lift assist after she slid out of bed. They noted a right gaze preference, left side flaccid and activated code stroke. BP on arrival 160/70 and glucose 325.  She was emergently taken to IR for a mechanical thrombectomy of her right M2. She was outside of the window for thrombolytic therapy.   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 Cardioembolic source  Code Stroke CT head Asymmetric hypoattenuation and loss of gray differentiation involving the right insula and overlying operculum, concerning for acute right MCA territory infarct.ASPECTS 7   CTA head & neck  Abrupt occlusion of a proximal M2 right middle cerebral artery vessel. The common carotid, internal carotid and vertebral arteries are patent within the neck without stenosis or significant atherosclerotic disease. No evidence of dissection. CT perfusion 26/45 IR - Status post endovascular revascularization of right middle cerebral artery achieving a TICI 3  revascularization.  Post IR CT minimal subarachnoid hyperattenuation in the distal sylvian fissure.  MRI  Acute R MCA ischemic infarct with petechial hemorrhage LE US no DVT  2D Echo EF 65-70% Left atrial size was mildly dilated  Recommend loop recorder prior to discharge  LDL 45 HgbA1c 9.5 VTE prophylaxis - SCD's No antithrombotic prior to admission, now on aspirin 81 mg daily and plavix DAPT for 3 weeks and then ASA alone.  Therapy recommendations:  SNF Disposition:  pending   Hypertensive emergency  Home meds:  atenolol  cleviprex gtt off  BP goal < 180 Continue home atenolol today  Long-term BP goal normotensive  Lipid management Home meds:  none LDL 45, goal < 70 High intensity statin not indicated due to below goal   Diabetes type II uncontrolled hyperglycemia Home meds:  glipizide, lantus   HgbA1c 9.5, goal < 7.0 CBGs Off insulin gtt  Will need close PCP follow up with PCP for diabetes management after discharge  Diabetes consult   Other Stroke Risk Factors Advanced Age >/= 265  Obesity, Body mass index is 47.11 kg/m., BMI >/= 30 associated with increased stroke risk, recommend weight loss, diet and exercise as appropriate  Hx stroke/TIA Potential Obstructive sleep apnea,  recommend outpatient sleep study   Other Active Problems Anxiety and depression  Bipolar disorder Bilateral open wounds- wound consult  Hypokalemia K 3.9 Leukocytosis - WBC 11.3. Afebrile. Will monitor, likely reactive.( Received decadron )  Will check in am   Hospital day # 2  Gevena Mart DNP, ACNPC-AG  [facility]  ATTENDING NOTE: I reviewed above note and agree with the assessment and plan. Pt was seen and examined.   Husband at bedside.  Patient reclining in bed, having breakfast.  Dysarthria, left facial droop and left arm weakness improving.  Off Cleviprex, on DAPT.  Recommend loop recorder before discharge.  PT/OT recommend SNF.  Transfer out of ICU.  For detailed assessment and plan, please refer to above/below as I have made changes wherever appropriate.   Marvel Plan, MD PhD Stroke Neurology 06/13/2022 7:48 PM   To contact Stroke Continuity provider, please refer to [email or web address]. After hours, contact General Neurology

## 2022-06-13 NOTE — TOC Initial Note (Signed)
Transition of Care [facility]) - Initial/Assessment Note    Patient Details  Name: [name] MRN: [id number] Date of Birth: 04/29/1951  [facility]) CM/SW Contact:    Mearl Latin, LCSW Phone Number: 06/13/2022, 5:08 PM  Clinical Narrative:                 CSW received consult for possible SNF placement at time of discharge. CSW spoke with patient. Patient reported that patient's spouse is currently unable to care for patient at their home given patient's current physical needs and fall risk. Patient expressed understanding of PT recommendation and is agreeable to SNF placement at time of discharge. Patient reports [facility] for [facility] so her husband can be close by to visit. CSW discussed insurance authorization process and will provide Medicare SNF ratings list. CSW will send out referrals for review and provide bed offers as available. Patient reports using a power chair at home and being able to transfer independently. Her son works but helps as he is able.   Skilled Nursing Rehab Facilities-   [email or web address]   Ratings out of 5 stars (5 the highest)   Name Address  Phone # Quality Care Staffing Health Inspection Overall  [facility] [address], [place] [phone] 4 5 2 3   [facility]  [address], [place] [phone] 4 2 5 5   [facility] [address] [place], [address] [place] 1 3 1 1   [facility] [address] 2 2 4 4   [facility] [address], [phone] 2 1 2 1   [facility] 306 466 [address], [phone] 3 3 4 4   [facility] [address], [address] [place] 4 1 3 2   [facility] [address], [facility] [place] 4 1 3 2   [address] ([place]) [address], [facility] 3 1 2 1   [facility] [address], [place] [phone] 3 1 1 1   [facility] [address] [facility] [phone] 3 2 2 2   [facility] ([place]) [address] [facility], [place] [phone] 3 1 1 1   [address] [facility] 4 2 4 4           [facility] [address] [facility] [place]      [facility] [address], [phone] 4 1 3 2   [facility] [place] [address] [phone] 3 1 5 4   [address], [place] [facility] [address], [place] [phone] 1 1 2 1   [facility] [address], [place] [phone] 2 2 4 4           [address] (no [facility]) [address], [place] [phone] 5 5 5 5   [facility] (No Humana) [address] [employer] [place] 4 1 4 3   [facility] (No UHC) [address], [place] [phone] 5 5 5 5   [facility] [address] [phone] 2 3 5 5   [facility] [address], [address] 1 1 2 1   [facility] [address], [place] [phone] 3 1 1 1   [place] [address] [place] [phone] 5 2 5 5   [facility]  [address], [place] 2 2 1 1   [facility] [address], [facility] [place] 3 2 1 1   [facility] [address] [place], [address] [place] 2 2 2  2  [facility] [address] [place] [phone] 1 1 1 1   [address]  [phone] 2 4 3 3   [facility] [address], [phone] 3 2 3 3   [facility] [place] [address], [facility] 2 1 1 1   [facility] (No Humana) [address], [place] [phone] 2 2 3 3   [facility]) [address], [address] [phone] 2 1 1 1           [facility] [address] [place], [facility] [place] 5 4 5 5   [facility])  [address] [place] 2 1 2 1   [facility] [place]) [address] [facility] 3 1 4 3   [facility] [address] [phone] 3 3 4 4   [address] [place], [phone] 2 3 1 1   [facility] Rehab [facility]) [address] [place] [phone] 2 1 4 3      Expected Discharge Plan: Skilled Nursing Facility Barriers to Discharge: Insurance Authorization, SNF Pending bed offer   Patient Goals and CMS Choice Patient states their goals for this hospitalization and ongoing recovery are:: Rehab CMS Medicare.gov Compare Post Acute Care list provided to:: Patient Choice offered to / list presented to : Patient Industry ownership interest in [facility]provided to:: Patient    Expected Discharge Plan and Services In-house Referral: Clinical Social Work   Post Acute Care Choice: Skilled Nursing Facility Living arrangements for the past 2 months: Single Family Home                                      Prior Living Arrangements/Services Living arrangements for the past 2 months: Single Family Home Lives with:: Spouse Patient language and need for interpreter reviewed:: Yes Do you feel safe going back to the place where you live?: Yes      Need for Family Participation in Patient Care: No (Comment) Care giver support system in place?: Yes (comment) Current home services: DME (power wc) Criminal Activity/Legal Involvement Pertinent to Current Situation/Hospitalization: No - Comment as needed  Activities of Daily Living      Permission Sought/Granted Permission sought to share information with : Facility [facility], Family Supports Permission granted to share information with : Yes, Verbal Permission Granted     Permission granted to share info w AGENCY: SNFs        Emotional Assessment Appearance:: Appears stated age Attitude/Demeanor/Rapport: Engaged, Gracious Affect (typically observed): Accepting, Appropriate, Pleasant Orientation: : Oriented to  Self, Oriented to Place, Oriented to  Time, Oriented to Situation Alcohol / Substance Use: Not Applicable Psych Involvement: No (comment)  Admission diagnosis:  Stroke (cerebrum) (HCC) [I63.9] Middle cerebral artery embolism, right [I66.01] Patient Active Problem List   Diagnosis Date Noted   Hypokalemia 06/12/2022   Leukocytosis 06/12/2022   Stroke (cerebrum) (HCC) 06/11/2022   Middle cerebral artery embolism, 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   Wound infection 02/05/2021   Venous stasis dermatitis of both lower extremities    Pneumonia due to COVID-19 virus 06/29/2019   Hypophosphatemia 06/24/2019   Obesity 06/23/2019   Diabetes mellitus type 2, uncontrolled, with complications 06/22/2019   Anxiety 06/22/2019   Depressive disorder 06/22/2019   Essential hypertension 06/22/2019   HLD (hyperlipidemia) 06/22/2019   COVID-19 06/21/2019  Severe protein-calorie malnutrition (HCC) 06/21/2019   Acute respiratory failure with hypoxia (HCC) 06/21/2019   Diabetic foot ulcer associated with type 2 diabetes mellitus (HCC)    Cellulitis 07/05/2016   Depression with anxiety 05/30/2016   Chronic skin ulcer of lower leg (HCC) 05/30/2016   Generalized weakness 05/30/2016   Left arm weakness 05/30/2016   Left Lower extremity pain  04/02/2016   Lower extremity pain, inferior, left 04/02/2016   Left leg pain    Personal history of noncompliance with medical treatment, presenting hazards to health 03/31/2016   Jaw pain 03/27/2016   Left leg cellulitis 03/24/2016   Cellulitis of left lower extremity    Primary insomnia    Adjustment disorder    Morbid obesity (HCC) 01/27/2016   Wheelchair dependent 01/27/2016   Gout 01/27/2016   Asthma 01/27/2016   Arthritis 01/27/2016   Uncontrolled diabetes mellitus type 2 with peripheral artery disease 01/25/2016   Cellulitis of left leg 01/11/2016   Insulin-requiring or dependent type II diabetes mellitus (HCC)  01/11/2016   Hypertension 01/11/2016   PCP:  Hilton Cork, PA-C Pharmacy:   [facility] - [place], [place] - [address] [facility] [address] [facility] [address] [place] [address] Phone: [phone] Fax: [phone]  [id number] [place], [place] - [address] [facility] [address] [facility] [address] AT [facility] [address] [place] [address] Phone: [phone] Fax: [phone]  [facility] [address] [place] [address] Phone: [phone] Fax: [phone]     Social Determinants of Health (SDOH) Social History: SDOH Screenings   Tobacco Use: Low Risk  (06/12/2022)   SDOH Interventions:     Readmission Risk Interventions     No data to display

## 2022-06-13 NOTE — Inpatient Diabetes Management (Signed)
Inpatient Diabetes Program Recommendations  AACE/ADA: New Consensus Statement on Inpatient Glycemic Control (2015)  Target Ranges:  Prepandial:   less than 140 mg/dL      Peak postprandial:   less than 180 mg/dL (1-2 hours)      Critically ill patients:  140 - 180 mg/dL   Lab Results  Component Value Date   GLUCAP 154 (H) 06/13/2022   HGBA1C 9.5 (H) 06/11/2022    Latest Reference Range & Units 06/12/22 19:44 06/12/22 23:31 06/13/22 03:28 06/13/22 08:30 06/13/22 11:38  Glucose-Capillary 70 - 99 mg/dL 264 (H) 75 87 94 158 (H)  (H): Data is abnormally high  Diabetes history: DM2 Outpatient Diabetes medications: Lantus 70 units QD Current orders for Inpatient glycemic control: levemir 24 units bid, Novolog 3-9 units q 4 hrs.  Inpatient Diabetes Program Recommendations:   Please consider: -Decrease Levemir to 22 units bid -Decrease Novolog correction to 0-9 units q 4 hrs.  Thank you, Billy Fischer. Burnice Oestreicher, RN, MSN, CDE  Diabetes Coordinator Inpatient Glycemic Control Team Team Pager [phone] (8am-5pm) 06/13/2022 12:40 PM

## 2022-06-14 DIAGNOSIS — I639 Cerebral infarction, unspecified: Secondary | ICD-10-CM | POA: Diagnosis not present

## 2022-06-14 LAB — BASIC METABOLIC PANEL
Anion gap: 8 (ref 5–15)
BUN: 10 mg/dL (ref 8–23)
CO2: 23 mmol/L (ref 22–32)
Calcium: 8.7 mg/dL — ABNORMAL LOW (ref 8.9–10.3)
Chloride: 105 mmol/L (ref 98–111)
Creatinine, Ser: 0.81 mg/dL (ref 0.44–1.00)
GFR, Estimated: >60 mL/min (ref >60)
Glucose, Bld: 153 mg/dL — ABNORMAL HIGH (ref 70–99)
Potassium: 4 mmol/L (ref 3.5–5.1)
Sodium: 136 mmol/L (ref 135–145)

## 2022-06-14 LAB — CBC
HCT: 34.7 % — ABNORMAL LOW (ref 36.0–46.0)
Hemoglobin: 11 g/dL — ABNORMAL LOW (ref 12.0–15.0)
MCH: 26.6 pg (ref 26.0–34.0)
MCHC: 31.7 g/dL (ref 30.0–36.0)
MCV: 83.8 fL (ref 80.0–100.0)
Platelets: 332 10*3/uL (ref 150–400)
RBC: 4.14 MIL/uL (ref 3.87–5.11)
RDW: 17.2 % — ABNORMAL HIGH (ref 11.5–15.5)
WBC: 7.9 10*3/uL (ref 4.0–10.5)
nRBC: 0 % (ref 0.0–0.2)

## 2022-06-14 LAB — GLUCOSE, CAPILLARY
Glucose-Capillary: 109 mg/dL — ABNORMAL HIGH (ref 70–99)
Glucose-Capillary: 113 mg/dL — ABNORMAL HIGH (ref 70–99)
Glucose-Capillary: 161 mg/dL — ABNORMAL HIGH (ref 70–99)
Glucose-Capillary: 191 mg/dL — ABNORMAL HIGH (ref 70–99)
Glucose-Capillary: 198 mg/dL — ABNORMAL HIGH (ref 70–99)
Glucose-Capillary: 217 mg/dL — ABNORMAL HIGH (ref 70–99)

## 2022-06-14 MED ORDER — POLYETHYLENE GLYCOL 3350 17 G PO PACK
17.0000 g | PACK | Freq: Every day | ORAL | Status: DC
  Administered 2022-06-14 – 2022-06-16 (×3): 17 g via ORAL
  Filled 2022-06-14 (×3): qty 1

## 2022-06-14 MED ORDER — DOCUSATE SODIUM 100 MG PO CAPS
100.0000 mg | ORAL_CAPSULE | Freq: Every day | ORAL | Status: DC
  Administered 2022-06-14 – 2022-06-16 (×3): 100 mg via ORAL
  Filled 2022-06-14 (×3): qty 1

## 2022-06-14 MED ORDER — BISACODYL 10 MG RE SUPP: 10.0000 mg | Freq: Every day | RECTAL | Status: DC | PRN

## 2022-06-14 MED ORDER — SENNOSIDES-DOCUSATE SODIUM 8.6-50 MG PO TABS
1.0000 | ORAL_TABLET | Freq: Every day | ORAL | Status: DC
  Administered 2022-06-14 – 2022-06-15 (×2): 1 via ORAL
  Filled 2022-06-14 (×2): qty 1

## 2022-06-14 NOTE — Progress Notes (Addendum)
STROKE TEAM PROGRESS NOTE   INTERVAL HISTORY No family at bedside.    [name] is awake and alert. Improved dysarthria, facial droop and LUE strength.  Bilateral legs wrapped in ace bandage due to open wounds. Currently on DAPT. Recommend loop recorder before discharge.  Pending insurance approval for discharge to SNF.    Vitals:   06/13/22 2250 06/13/22 2320 06/14/22 0352 06/14/22 0550  BP: 134/71 123/61 130/72 131/68  Pulse: 75  70   Resp: 20 (!) 21 17 20   Temp: 97.7 F (36.5 C)  (!) 97.5 F (36.4 C)   TempSrc: Oral  Oral   SpO2: 98%  98%   Weight:       CBC:  Recent Labs  Lab 06/11/22 1240 06/11/22 1243 06/12/22 0527 06/13/22 0154 06/14/22 0244  WBC 14.8*  --  19.0* 11.3* 7.9  NEUTROABS 13.5*  --  16.7*  --   --   HGB 11.1*   < > 11.4* 10.7* 11.0*  HCT 37.5   < > 34.7* 33.1* 34.7*  MCV 86.6  --  83.4 83.4 83.8  PLT 324  --  334 321 332   < > = values in this interval not displayed.    Basic Metabolic Panel:  Recent Labs  Lab 06/13/22 0154 06/14/22 0244  NA 137 136  K 3.9 4.0  CL 108 105  CO2 23 23  GLUCOSE 96 153*  BUN 7* 10  CREATININE 0.87 0.81  CALCIUM 8.7* 8.7*  MG 1.6*  --     Lipid Panel:  Recent Labs  Lab 06/12/22 0527  CHOL 112  TRIG 108  HDL 45  CHOLHDL 2.5  VLDL 22  LDLCALC 45    HgbA1c:  Recent Labs  Lab 06/11/22 1240  HGBA1C 9.5*    Urine Drug Screen: No results for input(s): "LABOPIA", "COCAINSCRNUR", "LABBENZ", "AMPHETMU", "THCU", "LABBARB" in the last 168 hours.  Alcohol Level  Recent Labs  Lab 06/11/22 1240  ETH <10     IMAGING past 24 hours No results found.  PHYSICAL EXAM  Temp:  [97.5 F (36.4 C)-98.5 F (36.9 C)] 97.5 F (36.4 C) (12/30 0352) Pulse Rate:  [60-75] 70 (12/30 0352) Resp:  [14-26] 20 (12/30 0550) BP: (123-169)/(61-109) 131/68 (12/30 0550) SpO2:  [81 %-100 %] 98 % (12/30 0352)  General -well-developed well-nourished, no acute distress. Cardiovascular - Regular rhythm and rate.  Mental  Status -  [name] is awake and alert, oriented to place, age, month and year.  No obvious visual field cut..EOMI.  Left facial droop, no aphasia no dysarthria. Motor strength: LUE improved to 4/5.  Left leg 3/5, horizontal movement and lifts against gravity.  Bilateral legs wrapped in ace bandage due to open wounds  ASSESSMENT/PLAN [name] is a 71 y.o. female with history of  DM2, TIA, osteoarthritis, HLD, migraine, HTN, GOUT, depression, asthma, arthritis, bipolar disorder, PVD of bilateral lower extremities presenting via EMS with left side weakness. EMS was initially dispatched for lift assist after [name] slid out of bed. They noted a right gaze preference, left side flaccid and activated code stroke. BP on arrival 160/70 and glucose 325.  [name] was emergently taken to IR for a mechanical thrombectomy of [name] right M2. [name] was outside of the window for thrombolytic therapy.   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 Cardioembolic source  Code Stroke CT head Asymmetric hypoattenuation  and loss of gray differentiation involving the right insula and overlying operculum, concerning for acute right MCA territory infarct.ASPECTS 7   CTA head & neck  Abrupt occlusion of a proximal M2 right middle cerebral artery vessel. The common carotid, internal carotid and vertebral arteries are patent within the neck without stenosis or significant atherosclerotic disease. No evidence of dissection. CT perfusion 26/45 IR - Status post endovascular revascularization of right middle cerebral artery achieving a TICI 3  revascularization.  Post IR CT minimal subarachnoid hyperattenuation in the distal sylvian fissure.  MRI  Acute R MCA ischemic infarct with petechial hemorrhage LE [place] no DVT  2D Echo EF 65-70% Left atrial size was mildly dilated  Recommend loop recorder prior to discharge  LDL 45 HgbA1c 9.5 VTE  prophylaxis - SCD's No antithrombotic prior to admission, now on aspirin 81 mg daily and plavix DAPT for 3 weeks and then ASA alone.  Therapy recommendations:  SNF Disposition:  pending   Hypertensive emergency  Home meds:  atenolol  cleviprex gtt off  BP goal < 180 Continue home atenolol today  Long-term BP goal normotensive  Lipid management Home meds:  none LDL 45, goal < 70 High intensity statin not indicated due to below goal   Diabetes type II uncontrolled hyperglycemia Home meds:  glipizide, lantus  HgbA1c 9.5, goal < 7.0 CBGs Off insulin gtt  Will need close PCP follow up with PCP for diabetes management after discharge  Diabetes consult   Other Stroke Risk Factors Advanced Age >/= 93  Obesity, Body mass index is 47.11 kg/m., BMI >/= 30 associated with increased stroke risk, recommend weight loss, diet and exercise as appropriate  Hx stroke/TIA Potential Obstructive sleep apnea, recommend outpatient sleep study   Other Active Problems Anxiety and depression  Bipolar disorder Bilateral open wounds- wound consult  Hypokalemia K 3.9--> 4.0  Hospital day # 3  Pt seen by Neuro NP/APP and later by MD. Note/plan to be edited by MD as needed.    Lynnae January, DNP, AGACNP-BC [facility] Please use AMION for pager and EPIC for messaging  ATTENDING ATTESTATION:  71 year old right MCA infarct MCA occlusion status post IR.  [name] is on DAPT therapy and statin.  Pending SNF placement.  Loop monitor before SNF placement.   Dr. Viviann Spare evaluated pt independently, reviewed imaging, chart, labs. Discussed and formulated plan with the Resident/APP. Changes were made to the note where appropriate. Please see APP/resident note above for details.    Arian Murley,MD     To contact Stroke Continuity provider, please refer to [email or web address]. After hours, contact General Neurology

## 2022-06-14 NOTE — Progress Notes (Addendum)
Patient arrived to room 3w23 from 4N ICU.  Assessment complete, VS obtained, and Admission database began.

## 2022-06-15 DIAGNOSIS — I639 Cerebral infarction, unspecified: Secondary | ICD-10-CM | POA: Diagnosis not present

## 2022-06-15 LAB — CBC
HCT: 35.5 % — ABNORMAL LOW (ref 36.0–46.0)
Hemoglobin: 10.8 g/dL — ABNORMAL LOW (ref 12.0–15.0)
MCH: 26 pg (ref 26.0–34.0)
MCHC: 30.4 g/dL (ref 30.0–36.0)
MCV: 85.5 fL (ref 80.0–100.0)
Platelets: 331 10*3/uL (ref 150–400)
RBC: 4.15 MIL/uL (ref 3.87–5.11)
RDW: 16.7 % — ABNORMAL HIGH (ref 11.5–15.5)
WBC: 8 10*3/uL (ref 4.0–10.5)
nRBC: 0 % (ref 0.0–0.2)

## 2022-06-15 LAB — GLUCOSE, CAPILLARY
Glucose-Capillary: 118 mg/dL — ABNORMAL HIGH (ref 70–99)
Glucose-Capillary: 122 mg/dL — ABNORMAL HIGH (ref 70–99)
Glucose-Capillary: 118 mg/dL — ABNORMAL HIGH (ref 70–99)
Glucose-Capillary: 156 mg/dL — ABNORMAL HIGH (ref 70–99)
Glucose-Capillary: 210 mg/dL — ABNORMAL HIGH (ref 70–99)
Glucose-Capillary: 188 mg/dL — ABNORMAL HIGH (ref 70–99)

## 2022-06-15 LAB — BASIC METABOLIC PANEL
Anion gap: 9 (ref 5–15)
BUN: 16 mg/dL (ref 8–23)
CO2: 23 mmol/L (ref 22–32)
Calcium: 9 mg/dL (ref 8.9–10.3)
Chloride: 103 mmol/L (ref 98–111)
Creatinine, Ser: 0.93 mg/dL (ref 0.44–1.00)
GFR, Estimated: >60 mL/min (ref >60)
Glucose, Bld: 139 mg/dL — ABNORMAL HIGH (ref 70–99)
Potassium: 4 mmol/L (ref 3.5–5.1)
Sodium: 135 mmol/L (ref 135–145)

## 2022-06-15 NOTE — Care Management (Signed)
Verified w [facility] that patient was recently active prior to admission and they would be able to continue services after DC if needed. [facility] would need home health order with face to face entered.

## 2022-06-15 NOTE — Discharge Summary (Shared)
Stroke Discharge Summary  Patient ID: [name]   MRN: [id number]      DOB: Nov 04, 1950  Date of Admission: 06/11/2022 Date of Discharge: 06/15/2022  Attending Physician:  Stroke, Md, MD, Stroke MD Patient's PCP:  Hilton Cork, PA-C  DISCHARGE DIAGNOSIS: *** Principal Problem:   Cryptogenic stroke [facility]) Active Problems:   Insulin-requiring or dependent type II diabetes mellitus (HCC)   Hypertension   Obesity   Middle cerebral artery embolism, right   Hypokalemia   Leukocytosis   Allergies as of 06/15/2022       Reactions   Ace Inhibitors Swelling   Latex Itching, Swelling   Tizanidine    unk   Tramadol Other (See Comments)   UNK reaction   Metformin And Related Other (See Comments)   CHILLS   Propofol Itching     Med Rec must be completed prior to using this SMARTLINK***       LABORATORY STUDIES CBC    Component Value Date/Time   WBC 8.0 06/15/2022 0236   RBC 4.15 06/15/2022 0236   HGB 10.8 (L) 06/15/2022 0236   HCT 35.5 (L) 06/15/2022 0236   PLT 331 06/15/2022 0236   MCV 85.5 06/15/2022 0236   MCH 26.0 06/15/2022 0236   MCHC 30.4 06/15/2022 0236   RDW 16.7 (H) 06/15/2022 0236   LYMPHSABS 0.9 06/12/2022 0527   MONOABS 1.3 (H) 06/12/2022 0527   EOSABS 0.0 06/12/2022 0527   BASOSABS 0.0 06/12/2022 0527   CMP    Component Value Date/Time   NA 135 06/15/2022 0236   K 4.0 06/15/2022 0236   CL 103 06/15/2022 0236   CO2 23 06/15/2022 0236   GLUCOSE 139 (H) 06/15/2022 0236   BUN 16 06/15/2022 0236   CREATININE 0.93 06/15/2022 0236   CREATININE 0.88 01/25/2016 0001   CALCIUM 9.0 06/15/2022 0236   PROT 8.5 (H) 06/11/2022 1240   ALBUMIN 2.6 (L) 06/11/2022 1240   AST 28 06/11/2022 1240   ALT 159 (H) 06/11/2022 1240   ALKPHOS 128 (H) 06/11/2022 1240   BILITOT 0.6 06/11/2022 1240   GFRNONAA >60 06/15/2022 0236   GFRAA >60 11/18/2019 0251   COAGS Lab Results  Component Value Date   INR 1.2 06/11/2022   INR 1.13 02/25/2017    Lipid Panel    Component Value Date/Time   CHOL 112 06/12/2022 0527   TRIG 108 06/12/2022 0527   HDL 45 06/12/2022 0527   CHOLHDL 2.5 06/12/2022 0527   VLDL 22 06/12/2022 0527   LDLCALC 45 06/12/2022 0527   HgbA1C  Lab Results  Component Value Date   HGBA1C 9.5 (H) 06/11/2022   Urinalysis    Component Value Date/Time   COLORURINE YELLOW 03/16/2017 2237   APPEARANCEUR CLEAR 03/16/2017 2237   LABSPEC 1.017 03/16/2017 2237   PHURINE 5.0 03/16/2017 2237   GLUCOSEU NEGATIVE 03/16/2017 2237   HGBUR NEGATIVE 03/16/2017 2237   BILIRUBINUR NEGATIVE 03/16/2017 2237   KETONESUR NEGATIVE 03/16/2017 2237   PROTEINUR 30 (A) 03/16/2017 2237   NITRITE NEGATIVE 03/16/2017 2237   LEUKOCYTESUR NEGATIVE 03/16/2017 2237   Urine Drug Screen No results found for: "LABOPIA", "COCAINSCRNUR", "LABBENZ", "AMPHETMU", "THCU", "LABBARB"  Alcohol Level    Component Value Date/Time   ETH <10 06/11/2022 1240     SIGNIFICANT DIAGNOSTIC STUDIES VAS [place] LOWER EXTREMITY VENOUS (DVT)  Result Date: 06/12/2022  Lower Venous DVT Study Patient Name:  [name]  Date of Exam:   06/12/2022 Medical Rec #: [id number]  Accession #:    [id number] Date of Birth: April 18, 1951       Patient Gender: F Patient Age:   71 years [facility] Procedure:      VAS US LOWER EXTREMITY VENOUS (DVT) Referring Phys: Scheryl MartenJINDONG XU --------------------------------------------------------------------------------  Indications: Stroke.  Limitations: Body habitus and poor ultrasound/tissue interface. Comparison Study: 07/20/19 prior Performing Technologist: [place] PonderMegan Stricklin RVS  Examination Guidelines: A complete evaluation includes B-mode imaging, spectral Doppler, color Doppler, and power Doppler as needed of all accessible portions of each vessel. Bilateral testing is considered an integral part of a complete examination. Limited examinations for reoccurring indications may be performed as noted. The reflux  portion of the exam is performed with the patient in reverse Trendelenburg.  +--------+---------------+---------+-----------+----------+--------------------+ RIGHT   CompressibilityPhasicitySpontaneityPropertiesThrombus Aging       +--------+---------------+---------+-----------+----------+--------------------+ CFV     Full           Yes      Yes                                       +--------+---------------+---------+-----------+----------+--------------------+ SFJ     Full                                                              +--------+---------------+---------+-----------+----------+--------------------+ FV Prox Full                                                              +--------+---------------+---------+-----------+----------+--------------------+ FV Mid                 Yes      Yes                                       +--------+---------------+---------+-----------+----------+--------------------+ FV                     Yes      Yes                                       Distal                                                                    +--------+---------------+---------+-----------+----------+--------------------+ PFV     Full                                                              +--------+---------------+---------+-----------+----------+--------------------+  POP     Full           Yes      Yes                                       +--------+---------------+---------+-----------+----------+--------------------+ PTV                    Yes      Yes                  patent by color                                                           doppler              +--------+---------------+---------+-----------+----------+--------------------+ PERO                                                 Not well visualized  +--------+---------------+---------+-----------+----------+--------------------+    +---------+---------------+---------+-----------+----------+-------------------+ LEFT     CompressibilityPhasicitySpontaneityPropertiesThrombus Aging      +---------+---------------+---------+-----------+----------+-------------------+ CFV      Full           Yes      Yes                                      +---------+---------------+---------+-----------+----------+-------------------+ SFJ      Full                                                             +---------+---------------+---------+-----------+----------+-------------------+ FV Prox  Full                                                             +---------+---------------+---------+-----------+----------+-------------------+ FV Mid                  Yes      Yes                                      +---------+---------------+---------+-----------+----------+-------------------+ FV Distal               Yes      Yes                                      +---------+---------------+---------+-----------+----------+-------------------+ PFV      Full                                                             +---------+---------------+---------+-----------+----------+-------------------+  POP      Full           Yes      Yes                                      +---------+---------------+---------+-----------+----------+-------------------+ PTV                                                   Not well visualized +---------+---------------+---------+-----------+----------+-------------------+ PERO                                                  Not well visualized +---------+---------------+---------+-----------+----------+-------------------+     Summary: RIGHT: - There is no evidence of deep vein thrombosis in the lower extremity. However, portions of this examination were limited- see technologist comments above.  - No cystic structure found in the popliteal fossa.  LEFT: - There is  no evidence of deep vein thrombosis in the lower extremity. However, portions of this examination were limited- see technologist comments above.  - No cystic structure found in the popliteal fossa.  *See table(s) above for measurements and observations. Electronically signed by Coral Else MD on 06/12/2022 at 5:20:30 PM.    Final    MR BRAIN WO CONTRAST  Result Date: 06/12/2022 CLINICAL DATA:  Follow-up examination for stroke. EXAM: MRI HEAD WITHOUT CONTRAST TECHNIQUE: Multiplanar, multiecho pulse sequences of the brain and surrounding structures were obtained without intravenous contrast. COMPARISON:  Prior studies from 06/11/2022. FINDINGS: Brain: Examination moderately degraded by motion artifact. Cerebral volume within normal limits. Scattered patchy T2/FLAIR hyperintensity involving the supratentorial cerebral white matter, nonspecific, but most commonly related to chronic microvascular ischemic disease. Mild patchy involvement of the pons. Appearance is mild for age. Restricted diffusion involving the anterior right frontal lobe, most pronounced at the right frontal operculum, consistent with acute right MCA distribution infarct. Associated petechial blood products without frank hemorrhagic transformation Heidelberg classification 1a: HI1, scattered small petechiae, no mass effect. No other evidence for acute or subacute ischemia. No other acute or chronic intracranial blood products. No mass lesion, midline shift or mass effect. No hydrocephalus or extra-axial fluid collection. Pituitary gland and suprasellar region within normal limits. Vascular: Major intracranial vascular flow voids are grossly maintained. Skull and upper cervical spine: Craniocervical junction grossly within normal limits. Diffuse loss of normal bone marrow signal, nonspecific but can be seen with anemia, smoking, obesity, and infiltrative/myelofibrotic marrow processes. No scalp soft tissue abnormality. Sinuses/Orbits: Globes and  orbital soft tissues grossly within normal limits. Paranasal sinuses are largely clear. Small bilateral mastoid effusions noted. Negative nasopharynx. Other: None. IMPRESSION: 1. Motion degraded exam. 2. 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 Associated petechial blood products without frank hemorrhagic transformation or significant mass effect. Heidelberg classification 1a: HI1, scattered small petechiae, no mass effect. 3. Underlying mild chronic microvascular ischemic disease. Electronically Signed   By: Rise Mu M.D.   On: 06/12/2022 02:54   CT ANGIO HEAD NECK W WO CM W PERF (CODE STROKE)  Addendum Date: 06/11/2022   ADDENDUM REPORT: 06/11/2022 20:25 ADDENDUM: CTA head and CT perfusion head results communicated  to Dr. Wilford Corner by telephone at 12:32 p.m. on 06/11/2022. Electronically Signed   By: Jackey Loge D.O.   On: 06/11/2022 20:25   Result Date: 06/11/2022 CLINICAL DATA:  Provided history: Neuro deficit, acute, stroke suspected. EXAM: CT ANGIOGRAPHY HEAD AND NECK CT PERFUSION BRAIN TECHNIQUE: Multidetector CT imaging of the head and neck was performed using the standard protocol during bolus administration of intravenous contrast. Multiplanar CT image reconstructions and MIPs were obtained to evaluate the vascular anatomy. Carotid stenosis measurements (when applicable) are obtained utilizing NASCET criteria, using the distal internal carotid diameter as the denominator. Multiphase CT imaging of the brain was performed following IV bolus contrast injection. Subsequent parametric perfusion maps were calculated using RAPID software. RADIATION DOSE REDUCTION: This exam was performed according to the departmental dose-optimization program which includes automated exposure control, adjustment of the mA and/or kV according to patient size and/or use of iterative reconstruction technique. CONTRAST:  OMNIPAQUE IOHEXOL 350 MG/ML SOLN COMPARISON:   Non-contrast head CT performed earlier today 06/11/2022. FINDINGS: CTA NECK FINDINGS Aortic arch: Standard aortic branching. The visualized aortic arch is normal in caliber. Streak and beam hardening artifact arising from a dense right-sided contrast bolus partially obscures the right subclavian artery. Within this limitation, there is no appreciable hemodynamically significant innominate or proximal subclavian artery stenosis. Right carotid system: CCA and ICA patent within the neck without stenosis or significant atherosclerotic disease. No evidence of dissection. Left carotid system: CCA and ICA patent within the neck without stenosis or significant atherosclerotic disease. No evidence of dissection. Vertebral arteries: Vertebral arteries codominant and patent within the neck without stenosis or significant atherosclerotic disease. No evidence of dissection. Skeleton: Levocurvature of the cervical spine. Dextrocurvature of the thoracic spine, partially imaged. Cervical spondylosis. No acute fracture or aggressive osseous lesion. Other neck: No neck mass or cervical lymphadenopathy. Upper chest: No consolidation within the imaged lung apices. Review of the MIP images confirms the above findings CTA HEAD FINDINGS Anterior circulation: The intracranial internal carotid arteries are patent. Nonstenotic atherosclerotic plaque within both vessels. The M1 middle cerebral arteries are patent. Abrupt occlusion of a proximal M2 right MCA vessel (for instance as seen on series 15, image 20). No left M2 proximal branch occlusion or high-grade proximal stenosis is identified. The anterior cerebral arteries are patent. No intracranial aneurysm is identified. Posterior circulation: The intracranial vertebral arteries are patent. The basilar artery is patent. The posterior cerebral arteries are patent. Posterior communicating arteries are present bilaterally. Venous sinuses: Within the limitations of contrast timing, no  convincing thrombus. Anatomic variants: None significant. Review of the MIP images confirms the above findings CT Brain Perfusion Findings: CBF (<30%) Volume: 65mL (right MCA vascular territory) Perfusion (Tmax>6.0s) volume: 97mL (right MCA vascular territory) Mismatch Volume: 37mL Infarction Location:Right MCA vascular territory. Attempts are being made to reach the ordering provider at this time. IMPRESSION: CTA neck: The common carotid, internal carotid and vertebral arteries are patent within the neck without stenosis or significant atherosclerotic disease. No evidence of dissection. CTA head: Abrupt occlusion of a proximal M2 right middle cerebral artery vessel. CT perfusion head: The perfusion software identifies a 26 mL core infarct within the right MCA vascular territory. The perfusion software identifies a 45 mL region of critically hypoperfused parenchyma within the right MCA vascular territory (utilizing the Tmax>6 seconds threshold). Reported mismatch volume: 19 mL. Electronically Signed: By: Jackey Loge D.O. On: 06/11/2022 12:28   ECHOCARDIOGRAM COMPLETE  Result Date: 06/11/2022    ECHOCARDIOGRAM REPORT   Patient  Name:   [name] Date of Exam: 06/11/2022 Medical Rec #:  [id number]       Height:       65.0 in Accession #:    [id number]      Weight:       283.1 lb Date of Birth:  02/13/1951      BSA:          2.293 m Patient Age:    71 years        BP:           131/76 mmHg Patient Gender: F               HR:           82 bpm. Exam Location:  Inpatient Procedure: 2D Echo, Cardiac Doppler and Color Doppler Indications:    Stroke  History:        Patient has no prior history of Echocardiogram examinations.                 Risk Factors:Hypertension and Diabetes.  Sonographer:    Ross Ludwig RDCS (AE) Referring Phys: [id number] [facility]  Sonographer Comments: Patient is obese. IMPRESSIONS  1. Left ventricular ejection fraction, by estimation, is 65 to 70%. The left ventricle has normal function.  The left ventricle has no regional wall motion abnormalities. There is mild concentric left ventricular hypertrophy. Left ventricular diastolic parameters were normal.  2. Right ventricular systolic function is normal. The right ventricular size is mildly enlarged. Tricuspid regurgitation signal is inadequate for assessing PA pressure.  3. Left atrial size was mildly dilated.  4. The mitral valve is grossly normal. No evidence of mitral valve regurgitation.  5. The aortic valve is tricuspid. There is mild calcification of the aortic valve. There is mild thickening of the aortic valve. Aortic valve regurgitation is not visualized. Aortic valve sclerosis/calcification is present, without any evidence of aortic stenosis.  6. Aortic dilatation noted. There is borderline dilatation of the ascending aorta, measuring 39 mm.  7. The inferior vena cava is normal in size with greater than 50% respiratory variability, suggesting right atrial pressure of 3 mmHg. Comparison(s): No prior Echocardiogram. FINDINGS  Left Ventricle: Left ventricular ejection fraction, by estimation, is 65 to 70%. The left ventricle has normal function. The left ventricle has no regional wall motion abnormalities. The left ventricular internal cavity size was normal in size. There is  mild concentric left ventricular hypertrophy. Left ventricular diastolic parameters were normal. Right Ventricle: The right ventricular size is mildly enlarged. No increase in right ventricular wall thickness. Right ventricular systolic function is normal. Tricuspid regurgitation signal is inadequate for assessing PA pressure. Left Atrium: Left atrial size was mildly dilated. Right Atrium: Right atrial size was normal in size. Pericardium: There is no evidence of pericardial effusion. Mitral Valve: The mitral valve is grossly normal. There is mild thickening of the mitral valve leaflet(s). There is mild calcification of the mitral valve leaflet(s). Mild to moderate  mitral annular calcification. No evidence of mitral valve regurgitation. Tricuspid Valve: The tricuspid valve is normal in structure. Tricuspid valve regurgitation is trivial. Aortic Valve: The aortic valve is tricuspid. There is mild calcification of the aortic valve. There is mild thickening of the aortic valve. Aortic valve regurgitation is not visualized. Aortic valve sclerosis/calcification is present, without any evidence of aortic stenosis. Aortic valve mean gradient measures 6.0 mmHg. Aortic valve peak gradient measures 11.3 mmHg. Aortic valve area, by VTI measures 2.75 cm. Pulmonic Valve:  The pulmonic valve was normal in structure. Pulmonic valve regurgitation is trivial. Aorta: Aortic dilatation noted and the aortic root is normal in size and structure. There is borderline dilatation of the ascending aorta, measuring 39 mm. Venous: The inferior vena cava is normal in size with greater than 50% respiratory variability, suggesting right atrial pressure of 3 mmHg. IAS/Shunts: The atrial septum is grossly normal.  LEFT VENTRICLE PLAX 2D LVIDd:         5.00 cm   Diastology LVIDs:         2.60 cm   LV e' medial:    7.51 cm/s LV PW:         0.90 cm   LV E/e' medial:  13.7 LV IVS:        0.90 cm   LV e' lateral:   9.90 cm/s LVOT diam:     2.20 cm   LV E/e' lateral: 10.4 LV SV:         91 LV SV Index:   40 LVOT Area:     3.80 cm  RIGHT VENTRICLE             IVC RV Basal diam:  3.60 cm     IVC diam: 1.30 cm RV Mid diam:    3.90 cm RV S prime:     17.20 cm/s TAPSE (M-mode): 3.1 cm LEFT ATRIUM             Index        RIGHT ATRIUM           Index LA diam:        4.10 cm 1.79 cm/m   RA Area:     12.60 cm LA Vol (A2C):   77.9 ml 33.98 ml/m  RA Volume:   27.00 ml  11.78 ml/m LA Vol (A4C):   54.6 ml 23.81 ml/m LA Biplane Vol: 67.2 ml 29.31 ml/m  AORTIC VALVE AV Area (Vmax):    2.85 cm AV Area (Vmean):   2.73 cm AV Area (VTI):     2.75 cm AV Vmax:           168.00 cm/s AV Vmean:          113.000 cm/s AV VTI:             0.330 m AV Peak Grad:      11.3 mmHg AV Mean Grad:      6.0 mmHg LVOT Vmax:         126.00 cm/s LVOT Vmean:        81.200 cm/s LVOT VTI:          0.239 m LVOT/AV VTI ratio: 0.72  AORTA Ao Root diam: 2.60 cm Ao Asc diam:  3.90 cm MITRAL VALVE MV Area (PHT): 2.54 cm     SHUNTS MV Decel Time: 299 msec     Systemic VTI:  0.24 m MV E velocity: 103.00 cm/s  Systemic Diam: 2.20 cm MV A velocity: 124.00 cm/s MV E/A ratio:  0.83 Laurance Flatten MD Electronically signed by Laurance Flatten MD Signature Date/Time: 06/11/2022/5:36:33 PM    Final    CT HEAD CODE STROKE WO CONTRAST  Result Date: 06/11/2022 CLINICAL DATA:  Code stroke.  Neuro deficit, acute, stroke suspected EXAM: CT HEAD WITHOUT CONTRAST TECHNIQUE: Contiguous axial images were obtained from the base of the skull through the vertex without intravenous contrast. RADIATION DOSE REDUCTION: This exam was performed according to the departmental dose-optimization program which includes automated exposure control, adjustment  of the mA and/or kV according to patient size and/or use of iterative reconstruction technique. COMPARISON:  CT head 03/15/2020. FINDINGS: Brain: Asymmetric hypoattenuation and loss of gray differentiation involving the right insula, concerning for right MCA territory infarct. No evidence of acute hemorrhage, hydrocephalus, extra-axial collection or mass lesion/mass effect. Vascular: No definite hyperdense vessel identified. Skull: No acute fracture. Sinuses/Orbits: Clear sinuses.  No acute orbital findings. ASPECTS ([place] Stroke Program Early CT Score): 7 IMPRESSION: 1. Asymmetric hypoattenuation and loss of gray differentiation involving the right insula and overlying operculum, concerning for acute right MCA territory infarct. Recommend MRI. 2. ASPECTS is 7. Code stroke imaging results were communicated on 06/11/2022 at 11:36 am to provider Dr. Wilford Corner via telephone, who verbally acknowledged these results. Electronically Signed    By: Feliberto Harts M.D.   On: 06/11/2022 11:36      HISTORY OF PRESENT ILLNESS [name] is a 71 y.o. female with history of  DM2, TIA, osteoarthritis, HLD, migraine, HTN, GOUT, depression, asthma, arthritis, bipolar disorder, PVD of bilateral lower extremities presenting via EMS with left side weakness. EMS was initially dispatched for lift assist after she slid out of bed. They noted a right gaze preference, left side flaccid and activated code stroke. BP on arrival 160/70 and glucose 325.  She was emergently taken to IR for a mechanical thrombectomy of her right M2. She was outside of the window for thrombolytic therapy.    HOSPITAL COURSE Stroke:  Acute right MCA ischemic infarct with R M2 occlusion s/p IR with TICI 3, etiology:  unclear, possible due to large vessel disease vs. Cardioembolic source  Code Stroke CT head Asymmetric hypoattenuation and loss of gray differentiation involving the right insula and overlying operculum, concerning for acute right MCA territory infarct.ASPECTS 7   CTA head & neck  Abrupt occlusion of a proximal M2 right middle cerebral artery vessel. The common carotid, internal carotid and vertebral arteries are patent within the neck without stenosis or significant atherosclerotic disease. No evidence of dissection. CT perfusion 26/45 IR - Status post endovascular revascularization of right middle cerebral artery achieving a TICI 3  revascularization.  Post IR CT minimal subarachnoid hyperattenuation in the distal sylvian fissure.  MRI  Acute R MCA ischemic infarct with petechial hemorrhage LE [place] no DVT  2D Echo EF 65-70% Left atrial size was mildly dilated  Recommend loop recorder prior to discharge  LDL 45 HgbA1c 9.5 VTE prophylaxis - SCD's No antithrombotic prior to admission, now on aspirin 81 mg daily and plavix DAPT for 3 weeks and then ASA alone.  Therapy recommendations:  SNF Disposition:  pending   Hypertensive emergency  Home meds:   atenolol BP goal < 180 Continue home atenolol  Long-term BP goal normotensive   Lipid management Home meds:  none LDL 45, goal < 70 High intensity statin not indicated due to below goal    Diabetes type II uncontrolled hyperglycemia Home meds:  glipizide, lantus  HgbA1c 9.5, goal < 7.0 CBGs Off insulin gtt  Will need close PCP follow up with PCP for diabetes management after discharge  Diabetes consult    Other Stroke Risk Factors Advanced Age >/= 75  Obesity, Body mass index is 47.11 kg/m., BMI >/= 30 associated with increased stroke risk, recommend weight loss, diet and exercise as appropriate  Hx stroke/TIA Potential Obstructive sleep apnea, recommend outpatient sleep study     Other Active Problems Anxiety and depression  Bipolar disorder Bilateral open wounds- wound consult. Daily wrapping  Hypokalemia K 3.9--> 4.0, stable.   DISCHARGE EXAM Blood pressure 125/67, pulse 60, temperature 97.8 F (36.6 C), temperature source Oral, resp. rate 15, weight 128.4 kg, SpO2 100 %. ***  Discharge Diet       Diet   DIET DYS 2 Room service appropriate? Yes with Assist; Fluid consistency: Thin   liquids  DISCHARGE PLAN Disposition:  SNF aspirin 81 mg daily and clopidogrel 75 mg daily for secondary stroke prevention for 3 weeks then aspirin alone. Ongoing stroke risk factor control by Primary Care Physician at time of discharge Follow-up PCP Hilton Cork, PA-C in 2 weeks. Follow-up in [facility] in 8 weeks, office to schedule an appointment.   45 minutes were spent preparing discharge.  ***

## 2022-06-15 NOTE — Progress Notes (Addendum)
STROKE TEAM PROGRESS NOTE   INTERVAL HISTORY No family at bedside.    Awake and alert. Improved left arm ROM and strength. Resolved facial droop.   Bilateral legs wrapped in ace bandage due to open wounds.  Currently on DAPT. Recommend loop recorder before discharge.   Pending insurance approval for discharge to SNF.    Vitals:   06/14/22 1950 06/14/22 2346 06/15/22 0318 06/15/22 0759  BP: (!) 158/89 (!) 155/70 136/62 125/60  Pulse:  78 66 63  Resp: 19 20 20 19   Temp: 98.5 F (36.9 C) 99.8 F (37.7 C) 98 F (36.7 C) 98.2 F (36.8 C)  TempSrc: Oral Oral Oral Oral  SpO2:  100% 100%   Weight:       CBC:  Recent Labs  Lab 06/11/22 1240 06/11/22 1243 06/12/22 0527 06/13/22 0154 06/14/22 0244 06/15/22 0236  WBC 14.8*  --  19.0*   < > 7.9 8.0  NEUTROABS 13.5*  --  16.7*  --   --   --   HGB 11.1*   < > 11.4*   < > 11.0* 10.8*  HCT 37.5   < > 34.7*   < > 34.7* 35.5*  MCV 86.6  --  83.4   < > 83.8 85.5  PLT 324  --  334   < > 332 331   < > = values in this interval not displayed.    Basic Metabolic Panel:  Recent Labs  Lab 06/13/22 0154 06/14/22 0244 06/15/22 0236  NA 137 136 135  K 3.9 4.0 4.0  CL 108 105 103  CO2 23 23 23   GLUCOSE 96 153* 139*  BUN 7* 10 16  CREATININE 0.87 0.81 0.93  CALCIUM 8.7* 8.7* 9.0  MG 1.6*  --   --     Lipid Panel:  Recent Labs  Lab 06/12/22 0527  CHOL 112  TRIG 108  HDL 45  CHOLHDL 2.5  VLDL 22  LDLCALC 45    HgbA1c:  Recent Labs  Lab 06/11/22 1240  HGBA1C 9.5*    Urine Drug Screen: No results for input(s): "LABOPIA", "COCAINSCRNUR", "LABBENZ", "AMPHETMU", "THCU", "LABBARB" in the last 168 hours.  Alcohol Level  Recent Labs  Lab 06/11/22 1240  ETH <10     IMAGING past 24 hours No results found.  PHYSICAL EXAM  Temp:  [98 F (36.7 C)-99.8 F (37.7 C)] 98.2 F (36.8 C) (12/31 0759) Pulse Rate:  [63-78] 63 (12/31 0759) Resp:  [19-20] 19 (12/31 0759) BP: (125-158)/(60-89) 125/60 (12/31 0759) SpO2:   [100 %] 100 % (12/31 0318)  General -well-developed well-nourished, no acute distress. Cardiovascular - Regular rhythm and rate.  Mental Status -  She is awake and alert, oriented to place, age, month and year.  No obvious visual field cut..EOMI.  Left facial droop, no aphasia no dysarthria. Motor strength: LUE improved to 4/5.  Left leg 3/5, horizontal movement and lifts against gravity.  Bilateral legs wrapped in ace bandage due to open wounds  ASSESSMENT/PLAN [name] is a 71 y.o. female with history of  DM2, TIA, osteoarthritis, HLD, migraine, HTN, GOUT, depression, asthma, arthritis, bipolar disorder, PVD of bilateral lower extremities presenting via EMS with left side weakness. EMS was initially dispatched for lift assist after she slid out of bed. They noted a right gaze preference, left side flaccid and activated code stroke. BP on arrival 160/70 and glucose 325.  She was emergently taken to IR for a mechanical thrombectomy of her right M2.  She was outside of the window for thrombolytic therapy.   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 Cardioembolic source  Code Stroke CT head Asymmetric hypoattenuation and loss of gray differentiation involving the right insula and overlying operculum, concerning for acute right MCA territory infarct.ASPECTS 7   CTA head & neck  Abrupt occlusion of a proximal M2 right middle cerebral artery vessel. The common carotid, internal carotid and vertebral arteries are patent within the neck without stenosis or significant atherosclerotic disease. No evidence of dissection. CT perfusion 26/45 IR - Status post endovascular revascularization of right middle cerebral artery achieving a TICI 3  revascularization.  Post IR CT minimal subarachnoid hyperattenuation in the distal sylvian fissure.  MRI  Acute R MCA ischemic infarct with petechial  hemorrhage LE [place] no DVT  2D Echo EF 65-70% Left atrial size was mildly dilated  Recommend loop recorder prior to discharge  LDL 45 HgbA1c 9.5 VTE prophylaxis - SCD's No antithrombotic prior to admission, now on aspirin 81 mg daily and plavix DAPT for 3 weeks and then ASA alone.  Therapy recommendations:  SNF Disposition:  pending   Hypertensive emergency  Home meds:  atenolol BP goal < 180 Continue home atenolol  Long-term BP goal normotensive  Lipid management Home meds:  none LDL 45, goal < 70 High intensity statin not indicated due to below goal   Diabetes type II uncontrolled hyperglycemia Home meds:  glipizide, lantus  HgbA1c 9.5, goal < 7.0 CBGs Off insulin gtt  Will need close PCP follow up with PCP for diabetes management after discharge  Diabetes consult   Other Stroke Risk Factors Advanced Age >/= 49  Obesity, Body mass index is 47.11 kg/m., BMI >/= 30 associated with increased stroke risk, recommend weight loss, diet and exercise as appropriate  Hx stroke/TIA Potential Obstructive sleep apnea, recommend outpatient sleep study   Other Active Problems Anxiety and depression  Bipolar disorder Bilateral open wounds- wound consult. Daily wrapping Hypokalemia K 3.9--> 4.0, stable.  Hospital day # 4  Pt seen by Neuro NP/APP and later by MD. Note/plan to be edited by MD as needed.    [name] Santee, [name], [name] [facility] Please use AMION for pager and EPIC for messaging  ATTENDING ATTESTATION:    Expand All Collapse All  STROKE TEAM PROGRESS NOTE    INTERVAL HISTORY No family at bedside.     He is awake and alert. Improved dysarthria, facial droop and LUE strength.  Bilateral legs wrapped in ace bandage due to open wounds. Currently on DAPT. Recommend loop recorder before discharge.  Pending insurance approval for discharge to SNF.            Vitals:    06/13/22 2250 06/13/22 2320 06/14/22 0352 06/14/22 0550  BP: 134/71 123/61  130/72 131/68  Pulse: 75   70    Resp: 20 (!) 21 17 20   Temp: 97.7 F (36.5 C)   (!) 97.5 F (36.4 C)    TempSrc: Oral   Oral    SpO2: 98%   98%    Weight:            CBC:  Last Labs         Recent Labs  Lab 06/11/22 1240 06/11/22 1243 06/12/22 0527 06/13/22 0154 06/14/22 0244  WBC 14.8*  --  19.0* 11.3* 7.9  NEUTROABS 13.5*  --  16.7*  --   --  HGB 11.1*   < > 11.4* 10.7* 11.0*  HCT 37.5   < > 34.7* 33.1* 34.7*  MCV 86.6  --  83.4 83.4 83.8  PLT 324  --  334 321 332   < > = values in this interval not displayed.       Basic Metabolic Panel:  Last Labs      Recent Labs  Lab 06/13/22 0154 06/14/22 0244  NA 137 136  K 3.9 4.0  CL 108 105  CO2 23 23  GLUCOSE 96 153*  BUN 7* 10  CREATININE 0.87 0.81  CALCIUM 8.7* 8.7*  MG 1.6*  --        Lipid Panel:  Last Labs     Recent Labs  Lab 06/12/22 0527  CHOL 112  TRIG 108  HDL 45  CHOLHDL 2.5  VLDL 22  LDLCALC 45       HgbA1c:  Last Labs     Recent Labs  Lab 06/11/22 1240  HGBA1C 9.5*       Urine Drug Screen:  Last Labs  No results for input(s): "LABOPIA", "COCAINSCRNUR", "LABBENZ", "AMPHETMU", "THCU", "LABBARB" in the last 168 hours.    Alcohol Level  Last Labs     Recent Labs  Lab 06/11/22 1240  ETH <10         IMAGING past 24 hours No results found.   PHYSICAL EXAM   Temp:  [97.5 F (36.4 C)-98.5 F (36.9 C)] 97.5 F (36.4 C) (12/30 0352) Pulse Rate:  [60-75] 70 (12/30 0352) Resp:  [14-26] 20 (12/30 0550) BP: (123-169)/(61-109) 131/68 (12/30 0550) SpO2:  [81 %-100 %] 98 % (12/30 0352)   General -well-developed well-nourished, no acute distress. Cardiovascular - Regular rhythm and rate.   Mental Status -  She is awake and alert, oriented to place, age, month and year.  No obvious visual field cut..EOMI.  Left facial droop, no aphasia no dysarthria. Motor strength: LUE improved to 4/5.  Left leg 3/5, horizontal movement and lifts against gravity.  Bilateral legs wrapped in  ace bandage due to open wounds   ASSESSMENT/PLAN [name] is a 71 y.o. female with history of  DM2, TIA, osteoarthritis, HLD, migraine, HTN, GOUT, depression, asthma, arthritis, bipolar disorder, PVD of bilateral lower extremities presenting via EMS with left side weakness. EMS was initially dispatched for lift assist after she slid out of bed. They noted a right gaze preference, left side flaccid and activated code stroke. BP on arrival 160/70 and glucose 325.  She was emergently taken to IR for a mechanical thrombectomy of her right M2. She was outside of the window for thrombolytic therapy.    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 Cardioembolic source  Code Stroke CT head Asymmetric hypoattenuation and loss of gray differentiation involving the right insula and overlying operculum, concerning for acute right MCA territory infarct.ASPECTS 7   CTA head & neck  Abrupt occlusion of a proximal M2 right middle cerebral artery vessel. The common carotid, internal carotid and vertebral arteries are patent within the neck without stenosis or significant atherosclerotic disease. No evidence of dissection. CT perfusion 26/45 IR - Status post endovascular revascularization of right middle cerebral artery achieving a TICI 3  revascularization.  Post IR CT minimal subarachnoid hyperattenuation in the distal sylvian fissure.  MRI  Acute R MCA ischemic infarct with petechial hemorrhage LE [place]  no DVT  2D Echo EF 65-70% Left atrial size was mildly dilated  Recommend loop recorder prior to discharge  LDL 45 HgbA1c 9.5 VTE prophylaxis - SCD's No antithrombotic prior to admission, now on aspirin 81 mg daily and plavix DAPT for 3 weeks and then ASA alone.  Therapy recommendations:  SNF Disposition:  pending    Hypertensive emergency  Home meds:  atenolol  cleviprex gtt off  BP goal <  180 Continue home atenolol today  Long-term BP goal normotensive   Lipid management Home meds:  none LDL 45, goal < 70 High intensity statin not indicated due to below goal    Diabetes type II uncontrolled hyperglycemia Home meds:  glipizide, lantus  HgbA1c 9.5, goal < 7.0 CBGs Off insulin gtt  Will need close PCP follow up with PCP for diabetes management after discharge  Diabetes consult    Other Stroke Risk Factors Advanced Age >/= 70  Obesity, Body mass index is 47.11 kg/m., BMI >/= 30 associated with increased stroke risk, recommend weight loss, diet and exercise as appropriate  Hx stroke/TIA Potential Obstructive sleep apnea, recommend outpatient sleep study    Other Active Problems Anxiety and depression  Bipolar disorder Bilateral open wounds- wound consult  Hypokalemia K 3.9--> 4.0   Hospital day # 3   Pt seen by Neuro NP/APP and later by MD. Note/plan to be edited by MD as needed.     [name] Santee, [name], [name] [facility] Please use AMION for pager and EPIC for messaging   ATTENDING ATTESTATION:   71 year old right MCA infarct MCA occlusion status post IR.  He is on DAPT therapy and statin.  Pending SNF placement, however patient tells me that she is at her baseline.  She uses a powered wheelchair at home and has a lift installed in her bed room.  It appears that she may not need SNF placement.  Have asked PT to reevaluate as patient is very interested in going home and can take care of herself with her husband's help.  Loop monitor before SNF placement.     Dr. Reeves Forth evaluated pt independently, reviewed imaging, chart, labs. Discussed and formulated plan with the Resident/APP. Changes were made to the note where appropriate. Please see APP/resident note above for details.    [name] Kristiansen,MD         To contact Stroke Continuity provider, please refer to [email or web address] After hours, contact General Neurology

## 2022-06-15 NOTE — Progress Notes (Signed)
Physical Therapy Treatment Patient Details Name: [name] MRN: [id number] DOB: 1951/05/07 Today's Date: 06/15/2022   History of Present Illness Pt is a 71 y/o female admitted 12/27 via EMS with L sided weakness.  Also noted R gaze preference, left side flaccid.with neglect and Left field cut.  Pt s/p endovascular revascularization of R MCA achieving a TICI3 revascularization.  PMH venous stasis, HTN, DM2, morbid obesity,  wheelchair use, gout, chronic back pain, HLD , OA, bipolar d/o, PVD bil LE's    PT Comments    Seen for reassessment at RN request as pt "bedbound" at baseline and close to baseline.  Discovered pt does not stay in bed at home, transfers to her power w/c and completes most ADL's with adaptation at home.  Able to transition with +1 A to stand and step to recliner then back to bed but still noting L LE buckling and L UE weakness.  She states spouse and son can assist at d/c.  She is eager for home, but discussed would not be able to step up on a stool to get to her spouse's higher vehicle to transition home.  Feel could go home with family support HHPT and ambulance transport home.  PT will follow up if not d/c.    Recommendations for follow up therapy are one component of a multi-disciplinary discharge planning process, led by the attending physician.  Recommendations may be updated based on patient status, additional functional criteria and insurance authorization.  Follow Up Recommendations  Home health PT Can patient physically be transported by private vehicle: No   Assistance Recommended at Discharge Frequent or constant Supervision/Assistance  Patient can return home with the following A little help with bathing/dressing/bathroom;A little help with walking and/or transfers;Assistance with cooking/housework;Assist for transportation;Help with stairs or ramp for entrance   Equipment Recommendations  None recommended by PT    Recommendations for Other Services        Precautions / Restrictions Precautions Precautions: Fall Precaution Comments: B Una boots; non-healing wounds; L side weakness     Mobility  Bed Mobility Overal bed mobility: Needs Assistance Bed Mobility: Supine to Sit, Sit to Supine     Supine to sit: HOB elevated, Mod assist Sit to supine: Mod assist   General bed mobility comments: assist to scoot hips esp on L to EOB; pt pulling up on rail with HOB elevated; assist for LE's to supine, pt able to pull on rails to scoot up in bed with bed trendelenberg    Transfers Overall transfer level: Needs assistance Equipment used: None Transfers: Bed to chair/wheelchair/BSC, Sit to/from Stand     Step pivot transfers: Mod assist       General transfer comment: up with A for balance and L side stability to step to recliner though L leg buckled and sat on lateral edge, able to scoot over with UE support and armrests; back to bed noted L LE buckling, but pt able to catch and with mod A step back to bed    Ambulation/Gait               General Gait Details: non ambulatory at baseline   Stairs             Wheelchair Mobility    Modified Rankin (Stroke Patients Only) Modified Rankin (Stroke Patients Only) Pre-Morbid Rankin Score: Moderately severe disability Modified Rankin: Moderately severe disability     Balance Overall balance assessment: Needs assistance   Sitting balance-Leahy Scale: Fair  Standing balance-Leahy Scale: Poor Standing balance comment: assist for balance in standing, pt reaching armrests to step pivot                            Cognition Arousal/Alertness: Awake/alert Behavior During Therapy: WFL for tasks assessed/performed Overall Cognitive Status: Impaired/Different from baseline Area of Impairment: Safety/judgement, Attention, Memory                   Current Attention Level: Selective Memory: Decreased short-term memory   Safety/Judgement:  Decreased awareness of safety, Decreased awareness of deficits              Exercises      General Comments General comments (skin integrity, edema, etc.): applied lotion to feet noting some cracking skin with chronic skin trophic changes and LE's wrapped with ace bandages      Pertinent Vitals/Pain Pain Assessment Faces Pain Scale: Hurts little more Pain Location: L hip with leg elevation Pain Descriptors / Indicators: Grimacing, Guarding Pain Intervention(s): Monitored during session, Repositioned    Home Living                          Prior Function            PT Goals (current goals can now be found in the care plan section) Progress towards PT goals: Progressing toward goals    Frequency           PT Plan Discharge plan needs to be updated    Co-evaluation              AM-PAC PT "6 Clicks" Mobility   Outcome Measure  Help needed turning from your back to your side while in a flat bed without using bedrails?: A Lot Help needed moving from lying on your back to sitting on the side of a flat bed without using bedrails?: A Lot Help needed moving to and from a bed to a chair (including a wheelchair)?: A Lot Help needed standing up from a chair using your arms (e.g., wheelchair or bedside chair)?: A Lot Help needed to walk in hospital room?: Total Help needed climbing 3-5 steps with a railing? : Total 6 Click Score: 10    End of Session Equipment Utilized During Treatment: Gait belt Activity Tolerance: Patient tolerated treatment well Patient left: in bed;with bed alarm set;with call bell/phone within reach   PT Visit Diagnosis: Other abnormalities of gait and mobility (R26.89);Muscle weakness (generalized) (M62.81);Hemiplegia and hemiparesis Hemiplegia - Right/Left: Left Hemiplegia - dominant/non-dominant: Non-dominant Hemiplegia - caused by: Cerebral infarction     Time: 6415-8309 PT Time Calculation (min) (ACUTE ONLY): 36  min  Charges:  $Therapeutic Activity: 23-37 mins                     Sheran Lawless, PT Acute Rehabilitation Services [phone] 06/15/2022    [name] 06/15/2022, 4:17 PM

## 2022-06-16 DIAGNOSIS — Z794 Long term (current) use of insulin: Secondary | ICD-10-CM | POA: Insufficient documentation

## 2022-06-16 DIAGNOSIS — I639 Cerebral infarction, unspecified: Secondary | ICD-10-CM | POA: Diagnosis not present

## 2022-06-16 DIAGNOSIS — F0394 Unspecified dementia, unspecified severity, with anxiety: Secondary | ICD-10-CM | POA: Insufficient documentation

## 2022-06-16 DIAGNOSIS — G8929 Other chronic pain: Secondary | ICD-10-CM | POA: Insufficient documentation

## 2022-06-16 DIAGNOSIS — Z6841 Body Mass Index (BMI) 40.0 and over, adult: Secondary | ICD-10-CM | POA: Insufficient documentation

## 2022-06-16 DIAGNOSIS — Z9181 History of falling: Secondary | ICD-10-CM | POA: Insufficient documentation

## 2022-06-16 LAB — BASIC METABOLIC PANEL
Anion gap: 13 (ref 5–15)
BUN: 15 mg/dL (ref 8–23)
CO2: 26 mmol/L (ref 22–32)
Calcium: 9.7 mg/dL (ref 8.9–10.3)
Chloride: 99 mmol/L (ref 98–111)
Creatinine, Ser: 0.92 mg/dL (ref 0.44–1.00)
GFR, Estimated: >60 mL/min (ref >60)
Glucose, Bld: 93 mg/dL (ref 70–99)
Potassium: 4 mmol/L (ref 3.5–5.1)
Sodium: 138 mmol/L (ref 135–145)

## 2022-06-16 LAB — CBC
HCT: 36.5 % (ref 36.0–46.0)
Hemoglobin: 11.5 g/dL — ABNORMAL LOW (ref 12.0–15.0)
MCH: 27.1 pg (ref 26.0–34.0)
MCHC: 31.5 g/dL (ref 30.0–36.0)
MCV: 85.9 fL (ref 80.0–100.0)
Platelets: 387 10*3/uL (ref 150–400)
RBC: 4.25 MIL/uL (ref 3.87–5.11)
RDW: 16.7 % — ABNORMAL HIGH (ref 11.5–15.5)
WBC: 7.2 10*3/uL (ref 4.0–10.5)
nRBC: 0 % (ref 0.0–0.2)

## 2022-06-16 LAB — GLUCOSE, CAPILLARY
Glucose-Capillary: 111 mg/dL — ABNORMAL HIGH (ref 70–99)
Glucose-Capillary: 190 mg/dL — ABNORMAL HIGH (ref 70–99)
Glucose-Capillary: 96 mg/dL (ref 70–99)
Glucose-Capillary: 99 mg/dL (ref 70–99)

## 2022-06-16 MED ORDER — ASPIRIN 81 MG PO CHEW: 81.0000 mg | CHEWABLE_TABLET | Freq: Every day | ORAL | 0 refills | Status: DC

## 2022-06-16 MED ORDER — CLOPIDOGREL BISULFATE 75 MG PO TABS: 75.0000 mg | ORAL_TABLET | Freq: Every day | ORAL | 0 refills | Status: DC

## 2022-06-16 NOTE — Progress Notes (Signed)
Pt will be seen tomorrow in anticipation of loop recorder insertion

## 2022-06-16 NOTE — Progress Notes (Signed)
06/16/22 Pt was discharged before receiving the copy of the IMM Letter. However, patient rcv'd initial letter and copy will be mailed to the patient mailing address on file.

## 2022-06-16 NOTE — Care Management Important Message (Signed)
Important Message  Patient Details  Name: [name] MRN: [id number] Date of Birth: 1951/05/01   Medicare Important Message Given:  Other (see comment)     Hannah Beat 06/16/2022, 2:57 PM

## 2022-06-16 NOTE — Progress Notes (Signed)
Pt cleared for d/c. Pt given d/c instructions and information provided. All questions answered. RN informed patient that medications had to be sent to [facility] on [address] because it is a holiday and that is the only 24 hr 7 day a week pharmacy. Pt verbalized understanding. Ambulance transport provided for safety back home for patient. IV removed. Biateral leg dressing in place.

## 2022-06-16 NOTE — Discharge Summary (Addendum)
Stroke Discharge Summary  Patient ID: [name]   MRN: [id number]      DOB: 07-14-1950  Date of Admission: 06/11/2022 Date of Discharge: 06/16/2022  Attending Physician:  Stroke, Md, MD, Stroke MD Patient's PCP:  [name], [name]  DISCHARGE DIAGNOSIS: Cryptogenic Ischemic Stroke Principal Problem:   Cryptogenic stroke [facility]) Active Problems:   Insulin-requiring or dependent type II diabetes mellitus ([place])   Hypertension   Left arm weakness   Diabetic foot ulcer associated with type 2 diabetes mellitus ([place])   Anxiety   HLD (hyperlipidemia)   Obesity   Venous stasis dermatitis of both lower extremities   Middle cerebral artery embolism, right   Allergies as of 06/16/2022       Reactions   Ace Inhibitors Swelling   Latex Itching, Swelling   Tizanidine    unk   Tramadol Other (See Comments)   UNK reaction   Metformin And Related Other (See Comments)   CHILLS   Propofol Itching        Medication List     TAKE these medications    acetaminophen 500 MG tablet Commonly known as: TYLENOL Take 500 mg by mouth daily as needed for moderate pain.   allopurinol 300 MG tablet Commonly known as: ZYLOPRIM Take 300 mg by mouth daily.   aspirin 81 MG chewable tablet Chew 1 tablet (81 mg total) by mouth daily. Start taking on: June 17, 2022   atenolol 25 MG tablet Commonly known as: TENORMIN Take 25-50 mg by mouth See admin instructions. Take 50 mg every morning and then 25 mg every evening   clopidogrel 75 MG tablet Commonly known as: PLAVIX Take 1 tablet (75 mg total) by mouth daily. Start taking on: June 17, 2022   clotrimazole 1 % cream Commonly known as: LOTRIMIN Apply 1 Application topically 2 (two) times daily as needed (rash).   cyclobenzaprine 5 MG tablet Commonly known as: FLEXERIL Take 5 mg by mouth 3 (three) times daily as needed for muscle spasms.   donepezil 10 MG tablet Commonly known as: ARICEPT Take 10 mg by mouth at  bedtime.   furosemide 20 MG tablet Commonly known as: Lasix Take 1 tablet (20 mg total) by mouth daily. What changed: how much to take   gabapentin 300 MG capsule Commonly known as: NEURONTIN Take 2 capsules (600 mg total) by mouth 2 (two) times daily. What changed: how much to take   glipiZIDE 10 MG tablet Commonly known as: GLUCOTROL Take 10 mg by mouth 2 (two) times daily before a meal.   hydrocerin Crea Apply 1 application topically daily. What changed:  when to take this reasons to take this   insulin glargine 100 UNIT/ML Solostar Pen Commonly known as: LANTUS Inject 70 Units into the skin every morning. And pen needles 1/day   lurasidone 20 MG Tabs tablet Commonly known as: LATUDA Take 1 tablet (20 mg total) by mouth daily with breakfast for 14 days.   lurasidone 40 MG Tabs tablet Commonly known as: Latuda Take 1 tablet (40 mg total) by mouth daily with breakfast.   polyethylene glycol 17 g packet Commonly known as: MIRALAX / GLYCOLAX Take 17 g by mouth daily as needed for moderate constipation.   traZODone 50 MG tablet Commonly known as: DESYREL Take 1 tablet (50 mg total) by mouth at bedtime and may repeat dose one time if needed. What changed:  when to take this reasons to take this  Discharge Care Instructions  (From admission, onward)           Start     Ordered   06/16/22 0000  Discharge wound care:       Comments: During your hospital stay, we have been changing your leg dressings daily.  Dressing procedure/placement/frequency: Once stable, bedside RN to cleanse bilateral legs with soap and water and pat dry. Apply Medihoney to open wounds.  Cover with gauze and ABD pad.  Wrap both legs with gauze from below toes to below knee and secure with ace wrap for modified light compression. Change daily. Please continue to keep the wounds clean and dry. When you follow-up with your PCP, they will instruct you further on wound care.    06/16/22 1128             LABORATORY STUDIES CBC    Component Value Date/Time   WBC 7.2 06/16/2022 0437   RBC 4.25 06/16/2022 0437   HGB 11.5 (L) 06/16/2022 0437   HCT 36.5 06/16/2022 0437   PLT 387 06/16/2022 0437   MCV 85.9 06/16/2022 0437   MCH 27.1 06/16/2022 0437   MCHC 31.5 06/16/2022 0437   RDW 16.7 (H) 06/16/2022 0437   LYMPHSABS 0.9 06/12/2022 0527   MONOABS 1.3 (H) 06/12/2022 0527   EOSABS 0.0 06/12/2022 0527   BASOSABS 0.0 06/12/2022 0527   CMP    Component Value Date/Time   NA 138 06/16/2022 0437   K 4.0 06/16/2022 0437   CL 99 06/16/2022 0437   CO2 26 06/16/2022 0437   GLUCOSE 93 06/16/2022 0437   BUN 15 06/16/2022 0437   CREATININE 0.92 06/16/2022 0437   CREATININE 0.88 01/25/2016 0001   CALCIUM 9.7 06/16/2022 0437   PROT 8.5 (H) 06/11/2022 1240   ALBUMIN 2.6 (L) 06/11/2022 1240   AST 28 06/11/2022 1240   ALT 159 (H) 06/11/2022 1240   ALKPHOS 128 (H) 06/11/2022 1240   BILITOT 0.6 06/11/2022 1240   GFRNONAA >60 06/16/2022 0437   GFRAA >60 11/18/2019 0251   COAGS Lab Results  Component Value Date   INR 1.2 06/11/2022   INR 1.13 02/25/2017   Lipid Panel    Component Value Date/Time   CHOL 112 06/12/2022 0527   TRIG 108 06/12/2022 0527   HDL 45 06/12/2022 0527   CHOLHDL 2.5 06/12/2022 0527   VLDL 22 06/12/2022 0527   LDLCALC 45 06/12/2022 0527   HgbA1C  Lab Results  Component Value Date   HGBA1C 9.5 (H) 06/11/2022   Urinalysis    Component Value Date/Time   COLORURINE YELLOW 03/16/2017 2237   APPEARANCEUR CLEAR 03/16/2017 2237   LABSPEC 1.017 03/16/2017 2237   PHURINE 5.0 03/16/2017 2237   GLUCOSEU NEGATIVE 03/16/2017 2237   HGBUR NEGATIVE 03/16/2017 2237   [place] NEGATIVE 03/16/2017 2237   KETONESUR NEGATIVE 03/16/2017 2237   PROTEINUR 30 (A) 03/16/2017 2237   NITRITE NEGATIVE 03/16/2017 2237   LEUKOCYTESUR NEGATIVE 03/16/2017 2237   Urine Drug Screen No results found for: "LABOPIA", "COCAINSCRNUR", "LABBENZ",  "AMPHETMU", "THCU", "LABBARB"  Alcohol Level    Component Value Date/Time   ETH <10 06/11/2022 1240     SIGNIFICANT DIAGNOSTIC STUDIES VAS [place] LOWER EXTREMITY VENOUS (DVT)  Result Date: 06/12/2022  Lower Venous DVT Study Patient Name:  [name]  Date of Exam:   06/12/2022 Medical Rec #: [id number]        Accession #:    [id number] Date of Birth: Feb 16, 1951       Patient Gender:  F Patient Age:   [age] years [facility] Procedure:      VAS US LOWER EXTREMITY VENOUS (DVT) Referring Phys: Scheryl MartenJINDONG XU --------------------------------------------------------------------------------  Indications: Stroke.  Limitations: Body habitus and poor ultrasound/tissue interface. Comparison Study: 07/20/19 prior Performing Technologist: [place] PonderMegan Stricklin RVS  Examination Guidelines: A complete evaluation includes B-mode imaging, spectral Doppler, color Doppler, and power Doppler as needed of all accessible portions of each vessel. Bilateral testing is considered an integral part of a complete examination. Limited examinations for reoccurring indications may be performed as noted. The reflux portion of the exam is performed with the patient in reverse Trendelenburg.  +--------+---------------+---------+-----------+----------+--------------------+ RIGHT   CompressibilityPhasicitySpontaneityPropertiesThrombus Aging       +--------+---------------+---------+-----------+----------+--------------------+ CFV     Full           Yes      Yes                                       +--------+---------------+---------+-----------+----------+--------------------+ SFJ     Full                                                              +--------+---------------+---------+-----------+----------+--------------------+ FV Prox Full                                                              +--------+---------------+---------+-----------+----------+--------------------+ FV Mid                  Yes      Yes                                       +--------+---------------+---------+-----------+----------+--------------------+ FV                     Yes      Yes                                       Distal                                                                    +--------+---------------+---------+-----------+----------+--------------------+ PFV     Full                                                              +--------+---------------+---------+-----------+----------+--------------------+ POP     Full  Yes      Yes                                       +--------+---------------+---------+-----------+----------+--------------------+ PTV                    Yes      Yes                  patent by color                                                           doppler              +--------+---------------+---------+-----------+----------+--------------------+ PERO                                                 Not well visualized  +--------+---------------+---------+-----------+----------+--------------------+   +---------+---------------+---------+-----------+----------+-------------------+ LEFT     CompressibilityPhasicitySpontaneityPropertiesThrombus Aging      +---------+---------------+---------+-----------+----------+-------------------+ CFV      Full           Yes      Yes                                      +---------+---------------+---------+-----------+----------+-------------------+ SFJ      Full                                                             +---------+---------------+---------+-----------+----------+-------------------+ FV Prox  Full                                                             +---------+---------------+---------+-----------+----------+-------------------+ FV Mid                  Yes      Yes                                       +---------+---------------+---------+-----------+----------+-------------------+ FV Distal               Yes      Yes                                      +---------+---------------+---------+-----------+----------+-------------------+ PFV      Full                                                             +---------+---------------+---------+-----------+----------+-------------------+  POP      Full           Yes      Yes                                      +---------+---------------+---------+-----------+----------+-------------------+ PTV                                                   Not well visualized +---------+---------------+---------+-----------+----------+-------------------+ PERO                                                  Not well visualized +---------+---------------+---------+-----------+----------+-------------------+     Summary: RIGHT: - There is no evidence of deep vein thrombosis in the lower extremity. However, portions of this examination were limited- see technologist comments above.  - No cystic structure found in the popliteal fossa.  LEFT: - There is no evidence of deep vein thrombosis in the lower extremity. However, portions of this examination were limited- see technologist comments above.  - No cystic structure found in the popliteal fossa.  *See table(s) above for measurements and observations. Electronically signed by Coral Else MD on 06/12/2022 at 5:20:30 PM.    Final    MR BRAIN WO CONTRAST  Result Date: 06/12/2022 CLINICAL DATA:  Follow-up examination for stroke. EXAM: MRI HEAD WITHOUT CONTRAST TECHNIQUE: Multiplanar, multiecho pulse sequences of the brain and surrounding structures were obtained without intravenous contrast. COMPARISON:  Prior studies from 06/11/2022. FINDINGS: Brain: Examination moderately degraded by motion artifact. Cerebral volume within normal limits. Scattered patchy T2/FLAIR hyperintensity involving the  supratentorial cerebral white matter, nonspecific, but most commonly related to chronic microvascular ischemic disease. Mild patchy involvement of the pons. Appearance is mild for age. Restricted diffusion involving the anterior right frontal lobe, most pronounced at the right frontal operculum, consistent with acute right MCA distribution infarct. Associated petechial blood products without frank hemorrhagic transformation Heidelberg classification 1a: HI1, scattered small petechiae, no mass effect. No other evidence for acute or subacute ischemia. No other acute or chronic intracranial blood products. No mass lesion, midline shift or mass effect. No hydrocephalus or extra-axial fluid collection. Pituitary gland and suprasellar region within normal limits. Vascular: Major intracranial vascular flow voids are grossly maintained. Skull and upper cervical spine: Craniocervical junction grossly within normal limits. Diffuse loss of normal bone marrow signal, nonspecific but can be seen with anemia, smoking, obesity, and infiltrative/myelofibrotic marrow processes. No scalp soft tissue abnormality. Sinuses/Orbits: Globes and orbital soft tissues grossly within normal limits. Paranasal sinuses are largely clear. Small bilateral mastoid effusions noted. Negative nasopharynx. Other: None. IMPRESSION: 1. Motion degraded exam. 2. 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 Associated petechial blood products without frank hemorrhagic transformation or significant mass effect. Heidelberg classification 1a: HI1, scattered small petechiae, no mass effect. 3. Underlying mild chronic microvascular ischemic disease. Electronically Signed   By: Rise Mu [name].D.   On: 06/12/2022 02:54   CT ANGIO HEAD NECK W WO CM W PERF (CODE STROKE)  Addendum Date: 06/11/2022   ADDENDUM REPORT: 06/11/2022 20:25 ADDENDUM: CTA head and CT perfusion head results communicated to  Dr. Wilford CornerArora by telephone  at 12:32 p.[name]. on 06/11/2022. Electronically Signed   By: Jackey LogeKyle  Golden D.O.   On: 06/11/2022 20:25   Result Date: 06/11/2022 CLINICAL DATA:  Provided history: Neuro deficit, acute, stroke suspected. EXAM: CT ANGIOGRAPHY HEAD AND NECK CT PERFUSION BRAIN TECHNIQUE: Multidetector CT imaging of the head and neck was performed using the standard protocol during bolus administration of intravenous contrast. Multiplanar CT image reconstructions and MIPs were obtained to evaluate the vascular anatomy. Carotid stenosis measurements (when applicable) are obtained utilizing NASCET criteria, using the distal internal carotid diameter as the denominator. Multiphase CT imaging of the brain was performed following IV bolus contrast injection. Subsequent parametric perfusion maps were calculated using RAPID software. RADIATION DOSE REDUCTION: This exam was performed according to the departmental dose-optimization program which includes automated exposure control, adjustment of the mA and/or kV according to patient size and/or use of iterative reconstruction technique. CONTRAST:  160mL OMNIPAQUE IOHEXOL 350 MG/ML SOLN COMPARISON:  Non-contrast head CT performed earlier today 06/11/2022. FINDINGS: CTA NECK FINDINGS Aortic arch: Standard aortic branching. The visualized aortic arch is normal in caliber. Streak and beam hardening artifact arising from a dense right-sided contrast bolus partially obscures the right subclavian artery. Within this limitation, there is no appreciable hemodynamically significant innominate or proximal subclavian artery stenosis. Right carotid system: CCA and ICA patent within the neck without stenosis or significant atherosclerotic disease. No evidence of dissection. Left carotid system: CCA and ICA patent within the neck without stenosis or significant atherosclerotic disease. No evidence of dissection. Vertebral arteries: Vertebral arteries codominant and patent within the neck without stenosis or  significant atherosclerotic disease. No evidence of dissection. Skeleton: Levocurvature of the cervical spine. Dextrocurvature of the thoracic spine, partially imaged. Cervical spondylosis. No acute fracture or aggressive osseous lesion. Other neck: No neck mass or cervical lymphadenopathy. Upper chest: No consolidation within the imaged lung apices. Review of the MIP images confirms the above findings CTA HEAD FINDINGS Anterior circulation: The intracranial internal carotid arteries are patent. Nonstenotic atherosclerotic plaque within both vessels. The M1 middle cerebral arteries are patent. Abrupt occlusion of a proximal M2 right MCA vessel (for instance as seen on series 15, image 20). No left M2 proximal branch occlusion or high-grade proximal stenosis is identified. The anterior cerebral arteries are patent. No intracranial aneurysm is identified. Posterior circulation: The intracranial vertebral arteries are patent. The basilar artery is patent. The posterior cerebral arteries are patent. Posterior communicating arteries are present bilaterally. Venous sinuses: Within the limitations of contrast timing, no convincing thrombus. Anatomic variants: None significant. Review of the MIP images confirms the above findings CT Brain Perfusion Findings: CBF (<30%) Volume: 26mL (right MCA vascular territory) Perfusion (Tmax>6.0s) volume: 45mL (right MCA vascular territory) Mismatch Volume: 19mL Infarction Location:Right MCA vascular territory. Attempts are being made to reach the ordering provider at this time. IMPRESSION: CTA neck: The common carotid, internal carotid and vertebral arteries are patent within the neck without stenosis or significant atherosclerotic disease. No evidence of dissection. CTA head: Abrupt occlusion of a proximal M2 right middle cerebral artery vessel. CT perfusion head: The perfusion software identifies a 26 mL core infarct within the right MCA vascular territory. The perfusion software  identifies a 45 mL region of critically hypoperfused parenchyma within the right MCA vascular territory (utilizing the Tmax>6 seconds threshold). Reported mismatch volume: 19 mL. Electronically Signed: By: Jackey LogeKyle  Golden D.O. On: 06/11/2022 12:28   ECHOCARDIOGRAM COMPLETE  Result Date: 06/11/2022    ECHOCARDIOGRAM REPORT   Patient  Name:   [name] Date of Exam: 06/11/2022 Medical Rec #:  [id number]       Height:       65.0 in Accession #:    [id number]      Weight:       283.1 lb Date of Birth:  1950/07/20      BSA:          2.293 [name] Patient Age:    71 years        BP:           131/76 mmHg Patient Gender: F               HR:           82 bpm. Exam Location:  Inpatient Procedure: 2D Echo, Cardiac Doppler and Color Doppler Indications:    Stroke  History:        Patient has no prior history of Echocardiogram examinations.                 Risk Factors:Hypertension and Diabetes.  Sonographer:    Ross Ludwig RDCS (AE) Referring Phys: [id number] [facility]  Sonographer Comments: Patient is obese. IMPRESSIONS  1. Left ventricular ejection fraction, by estimation, is 65 to 70%. The left ventricle has normal function. The left ventricle has no regional wall motion abnormalities. There is mild concentric left ventricular hypertrophy. Left ventricular diastolic parameters were normal.  2. Right ventricular systolic function is normal. The right ventricular size is mildly enlarged. Tricuspid regurgitation signal is inadequate for assessing PA pressure.  3. Left atrial size was mildly dilated.  4. The mitral valve is grossly normal. No evidence of mitral valve regurgitation.  5. The aortic valve is tricuspid. There is mild calcification of the aortic valve. There is mild thickening of the aortic valve. Aortic valve regurgitation is not visualized. Aortic valve sclerosis/calcification is present, without any evidence of aortic stenosis.  6. Aortic dilatation noted. There is borderline dilatation of the ascending aorta,  measuring 39 mm.  7. The inferior vena cava is normal in size with greater than 50% respiratory variability, suggesting right atrial pressure of 3 mmHg. Comparison(s): No prior Echocardiogram. FINDINGS  Left Ventricle: Left ventricular ejection fraction, by estimation, is 65 to 70%. The left ventricle has normal function. The left ventricle has no regional wall motion abnormalities. The left ventricular internal cavity size was normal in size. There is  mild concentric left ventricular hypertrophy. Left ventricular diastolic parameters were normal. Right Ventricle: The right ventricular size is mildly enlarged. No increase in right ventricular wall thickness. Right ventricular systolic function is normal. Tricuspid regurgitation signal is inadequate for assessing PA pressure. Left Atrium: Left atrial size was mildly dilated. Right Atrium: Right atrial size was normal in size. Pericardium: There is no evidence of pericardial effusion. Mitral Valve: The mitral valve is grossly normal. There is mild thickening of the mitral valve leaflet(s). There is mild calcification of the mitral valve leaflet(s). Mild to moderate mitral annular calcification. No evidence of mitral valve regurgitation. Tricuspid Valve: The tricuspid valve is normal in structure. Tricuspid valve regurgitation is trivial. Aortic Valve: The aortic valve is tricuspid. There is mild calcification of the aortic valve. There is mild thickening of the aortic valve. Aortic valve regurgitation is not visualized. Aortic valve sclerosis/calcification is present, without any evidence of aortic stenosis. Aortic valve mean gradient measures 6.0 mmHg. Aortic valve peak gradient measures 11.3 mmHg. Aortic valve area, by VTI measures 2.75 cm. Pulmonic Valve: The  pulmonic valve was normal in structure. Pulmonic valve regurgitation is trivial. Aorta: Aortic dilatation noted and the aortic root is normal in size and structure. There is borderline dilatation of the  ascending aorta, measuring 39 mm. Venous: The inferior vena cava is normal in size with greater than 50% respiratory variability, suggesting right atrial pressure of 3 mmHg. IAS/Shunts: The atrial septum is grossly normal.  LEFT VENTRICLE PLAX 2D LVIDd:         5.00 cm   Diastology LVIDs:         2.60 cm   LV e' medial:    7.51 cm/s LV PW:         0.90 cm   LV E/e' medial:  13.7 LV IVS:        0.90 cm   LV e' lateral:   9.90 cm/s LVOT diam:     2.20 cm   LV E/e' lateral: 10.4 LV SV:         91 LV SV Index:   40 LVOT Area:     3.80 cm  RIGHT VENTRICLE             IVC RV Basal diam:  3.60 cm     IVC diam: 1.30 cm RV Mid diam:    3.90 cm RV S prime:     17.20 cm/s TAPSE ([name]-mode): 3.1 cm LEFT ATRIUM             Index        RIGHT ATRIUM           Index LA diam:        4.10 cm 1.79 cm/[name]   RA Area:     12.60 cm LA Vol (A2C):   77.9 ml 33.98 ml/[name]  RA Volume:   27.00 ml  11.78 ml/[name] LA Vol (A4C):   54.6 ml 23.81 ml/[name] LA Biplane Vol: 67.2 ml 29.31 ml/[name]  AORTIC VALVE AV Area (Vmax):    2.85 cm AV Area (Vmean):   2.73 cm AV Area (VTI):     2.75 cm AV Vmax:           168.00 cm/s AV Vmean:          113.000 cm/s AV VTI:            0.330 [name] AV Peak Grad:      11.3 mmHg AV Mean Grad:      6.0 mmHg LVOT Vmax:         126.00 cm/s LVOT Vmean:        81.200 cm/s LVOT VTI:          0.239 [name] LVOT/AV VTI ratio: 0.72  AORTA Ao Root diam: 2.60 cm Ao Asc diam:  3.90 cm MITRAL VALVE MV Area (PHT): 2.54 cm     SHUNTS MV Decel Time: 299 msec     Systemic VTI:  0.24 [name] MV E velocity: 103.00 cm/s  Systemic Diam: 2.20 cm MV A velocity: 124.00 cm/s MV E/A ratio:  0.83 Laurance Flatten MD Electronically signed by Laurance Flatten MD Signature Date/Time: 06/11/2022/5:36:33 PM    Final    CT HEAD CODE STROKE WO CONTRAST  Result Date: 06/11/2022 CLINICAL DATA:  Code stroke.  Neuro deficit, acute, stroke suspected EXAM: CT HEAD WITHOUT CONTRAST TECHNIQUE: Contiguous axial images were obtained from the base of the skull through the vertex  without intravenous contrast. RADIATION DOSE REDUCTION: This exam was performed according to the departmental dose-optimization program which includes automated exposure control, adjustment  of the mA and/or kV according to patient size and/or use of iterative reconstruction technique. COMPARISON:  CT head 03/15/2020. FINDINGS: Brain: Asymmetric hypoattenuation and loss of gray differentiation involving the right insula, concerning for right MCA territory infarct. No evidence of acute hemorrhage, hydrocephalus, extra-axial collection or mass lesion/mass effect. Vascular: No definite hyperdense vessel identified. Skull: No acute fracture. Sinuses/Orbits: Clear sinuses.  No acute orbital findings. ASPECTS ([place] Stroke Program Early CT Score): 7 IMPRESSION: 1. Asymmetric hypoattenuation and loss of gray differentiation involving the right insula and overlying operculum, concerning for acute right MCA territory infarct. Recommend MRI. 2. ASPECTS is 7. Code stroke imaging results were communicated on 06/11/2022 at 11:36 am to provider Dr. Wilford Corner via telephone, who verbally acknowledged these results. Electronically Signed   By: Feliberto Harts [name].D.   On: 06/11/2022 11:36      HISTORY OF PRESENT ILLNESS [name] is a 72 y.o. female with history of  DM2, TIA, osteoarthritis, HLD, migraine, HTN, GOUT, depression, asthma, arthritis, bipolar disorder, PVD of bilateral lower extremities presenting via EMS with left side weakness. EMS was initially dispatched for lift assist after she slid out of bed. They noted a right gaze preference, left side flaccid and activated code stroke. BP on arrival 160/70 and glucose 325.  She was emergently taken to IR for a mechanical thrombectomy of her right M2. She was outside of the window for thrombolytic therapy.    HOSPITAL COURSE Stroke:  Acute right MCA ischemic infarct with R M2 occlusion s/p IR with TICI 3, etiology:  unclear, possible due to large vessel disease  vs. Cardioembolic source  Code Stroke CT head Asymmetric hypoattenuation and loss of gray differentiation involving the right insula and overlying operculum, concerning for acute right MCA territory infarct.ASPECTS 7   CTA head & neck  Abrupt occlusion of a proximal M2 right middle cerebral artery vessel. The common carotid, internal carotid and vertebral arteries are patent within the neck without stenosis or significant atherosclerotic disease. No evidence of dissection. CT perfusion 26/45 IR - Status post endovascular revascularization of right middle cerebral artery achieving a TICI 3  revascularization.  Post IR CT minimal subarachnoid hyperattenuation in the distal sylvian fissure.  MRI  Acute R MCA ischemic infarct with petechial hemorrhage LE [place] no DVT  2D Echo EF 65-70% Left atrial size was mildly dilated  Recommend loop recorder prior to discharge. Patient declined this option and we decided on a ZIO patch. This will be mailed to her by Cardiology.  LDL 45 HgbA1c 9.5 VTE prophylaxis - SCD's No antithrombotic prior to admission, now on aspirin 81 mg daily and plavix DAPT for 3 weeks and then ASA alone.  Therapy recommendations:  Home Health Disposition:  Home with Home Health  Hypertensive emergency  Home meds:  atenolol BP goal < 180 Continue home atenolol  Long-term BP goal normotensive   Lipid management Home meds:  none LDL 45, goal < 70 High intensity statin not indicated due to below goal    Diabetes type II uncontrolled hyperglycemia Home meds:  glipizide, lantus  HgbA1c 9.5, goal < 7.0 CBGs Off insulin gtt  Will need close PCP follow up with PCP for diabetes management after discharge  Diabetes consult    Other Stroke Risk Factors Advanced Age >/= 75  Obesity, Body mass index is 47.11 kg/[name]., BMI >/= 30 associated with increased stroke risk, recommend weight loss, diet and exercise as appropriate  Hx stroke/TIA Potential Obstructive sleep apnea, recommend  outpatient  sleep study     Other Active Problems Anxiety and depression  Bipolar disorder Bilateral open wounds- wound consult. Daily wrapping Hypokalemia K 3.9--> 4.0, stable.   DISCHARGE EXAM Blood pressure (!) 149/75, pulse 70, temperature 98.6 F (37 C), temperature source Oral, resp. rate (!) 22, weight 128.4 kg, SpO2 98 %.  Patient is awake, alert and oriented.  Able to give history and interact accordingly. She is anxious to go home, stating that her son is off today and can help pick up her medicines, etc.  She is alert awake.  No aphasia.  No dysarthria.  She can clearly express her will.  Naming repetition is intact.  Pupils equal and reactive.  No facial droop appreciated today.  Left upper extremity 4+/5.  Bilateral leg strength 3/5 that baseline.  Resolved facial droop.   Reevaluations by PT and OT changed their recommendations to Home with Home Health. Patient has a power wheelchair at home that she was using before her hospitalization and has used home health services in the past.   Discharge Diet       Diet   DIET DYS 2 Room service appropriate? Yes with Assist; Fluid consistency: Thin   liquids  DISCHARGE PLAN Disposition:  Home with Home Health aspirin 81 mg daily and clopidogrel 75 mg daily for secondary stroke prevention for 3 weeks then aspirin alone. Ongoing stroke risk factor control by Primary Care Physician at time of discharge Follow-up PCP [name], [name], [name] in 2 weeks. Follow-up in [facility] in 8 weeks, office to schedule an appointment.   45 minutes were spent preparing discharge.   Pt seen by Neuro NP/APP and later by MD. Note/plan to be edited by MD as needed.    Lynnae JanuaryErin C Lehner, DNP, AGACNP-BC [facility] Please use AMION for pager and EPIC for messaging  ATTENDING ATTESTATION:  This is a patient that wanted to go home yesterday.  She does not want to go to rehab and does not think she needs  it as she is near her baseline with chronic lower extremity weakness.  At home she is in a wheelchair has a lift chair and knows how to get around.  She is adamant on going home today.  She does not want to have a loop recorder placed.  She does not like to have anything on her skin she does not even wear jewelry or necklaces as it bothers her.  Discussed the risk of not knowing whether she has atrial fibrillation long-term and she understands this.  Recommended Zio patch which she is amenable to her 30-day monitor.  Discussed with EP and canceled her loop recorder procedure.  She has help at home with her husband and son.  Outpatient home health physical and Occupational Therapy will be set up as required as she needs assistance from transferring from wheelchair to bed.  Due to the holidays there is no time for have OT reevaluation but appreciate PT re evaluation.  I would recommend she follow-up in stroke clinic outpatient with Dr. Ursula BeathSethi [facility].  I discussed that if she change her mind she can certainly get a loop recorder outpatient at a later time.  Dr. Viviann SparePalikh evaluated pt independently, reviewed imaging, chart, labs. Discussed and formulated plan with the Resident/APP. Changes were made to the note where appropriate. Please see APP/resident note above for details.   Total 45 minutes spent on counseling patient and coordinating care, writing notes and reviewing chart.  Milinda Sweeney,MD

## 2022-06-16 NOTE — TOC Transition Note (Signed)
Transition of Care [facility]) - CM/SW Discharge Note   Patient Details  Name: [name] MRN: [id number] Date of Birth: September 19, 1950  Transition of Care [facility]) CM/SW Contact:  Zenon Mayo, RN Phone Number: 06/16/2022, 11:24 AM   Clinical Narrative:    NCM notified needs ambulance transport for dc home today,. She is also active with [facility] for HHPT, NCM offered choice, she would like to continue with them,  NCM confirmed with Malachy Mood .  Soc will begin 24 to 48 hrs post dc.  PTAR has been scheduled, spouse has been notified to expect patient.  Address per spouse is [address] [place] [address].   Final next level of care: Home w Home Health Services Barriers to Discharge: No Barriers Identified   Patient Goals and CMS Choice CMS Medicare.gov Compare Post Acute Care list provided to:: Patient Choice offered to / list presented to : Patient  Discharge Placement                         Discharge Plan and Services Additional resources added to the After Visit Summary for   In-house Referral: Clinical Social Work   Post Acute Care Choice: [place]          DME Arranged: N/A         HH Arranged: PT HH Agency: [place] Date [place]: 06/16/22 Time [place]: [id number] Representative spoke with at [place]: [place] Determinants of Health ([place]) Interventions SDOH Screenings   Tobacco Use: Low Risk  (06/12/2022)     Readmission Risk Interventions     No data to display

## 2022-06-17 ENCOUNTER — Encounter: Payer: Self-pay | Admitting: *Deleted

## 2022-06-17 ENCOUNTER — Other Ambulatory Visit: Payer: Self-pay | Admitting: Cardiology

## 2022-06-17 DIAGNOSIS — I4891 Unspecified atrial fibrillation: Secondary | ICD-10-CM

## 2022-06-17 DIAGNOSIS — I639 Cerebral infarction, unspecified: Secondary | ICD-10-CM

## 2022-06-17 NOTE — Progress Notes (Signed)
Patient ID: [name], female   DOB: 10-Apr-1951, 72 y.o.   MRN: [id number] Patient enrolled for [employer] to ship a 30 day cardiac event monitor to her PO BOX address on file.  Letter with instructions mailed to patient.

## 2022-06-17 NOTE — Progress Notes (Signed)
Event monitor s/p hospitalization with [facility] per hospitalist request. Appointment arranged.

## 2022-06-25 ENCOUNTER — Observation Stay (HOSPITAL_COMMUNITY)
Admission: EM | Admit: 2022-06-25 | Discharge: 2022-06-28 | Disposition: A | Discharge status: Home Health | Payer: Medicare PPO | Attending: Internal Medicine | Admitting: Internal Medicine

## 2022-06-25 ENCOUNTER — Observation Stay (HOSPITAL_BASED_OUTPATIENT_CLINIC_OR_DEPARTMENT_OTHER): Payer: Medicare PPO

## 2022-06-25 ENCOUNTER — Emergency Department (HOSPITAL_COMMUNITY): Payer: Medicare PPO

## 2022-06-25 ENCOUNTER — Encounter (HOSPITAL_COMMUNITY): Payer: Self-pay | Admitting: *Deleted

## 2022-06-25 ENCOUNTER — Other Ambulatory Visit: Payer: Self-pay

## 2022-06-25 DIAGNOSIS — I10 Essential (primary) hypertension: Secondary | ICD-10-CM

## 2022-06-25 DIAGNOSIS — I69354 Hemiplegia and hemiparesis following cerebral infarction affecting left non-dominant side: Secondary | ICD-10-CM | POA: Insufficient documentation

## 2022-06-25 DIAGNOSIS — E876 Hypokalemia: Secondary | ICD-10-CM | POA: Diagnosis not present

## 2022-06-25 DIAGNOSIS — R5383 Other fatigue: Secondary | ICD-10-CM | POA: Insufficient documentation

## 2022-06-25 DIAGNOSIS — R531 Weakness: Secondary | ICD-10-CM | POA: Diagnosis not present

## 2022-06-25 DIAGNOSIS — Z8673 Personal history of transient ischemic attack (TIA), and cerebral infarction without residual deficits: Secondary | ICD-10-CM

## 2022-06-25 DIAGNOSIS — N390 Urinary tract infection, site not specified: Secondary | ICD-10-CM | POA: Diagnosis not present

## 2022-06-25 DIAGNOSIS — Z7902 Long term (current) use of antithrombotics/antiplatelets: Secondary | ICD-10-CM | POA: Insufficient documentation

## 2022-06-25 DIAGNOSIS — R7989 Other specified abnormal findings of blood chemistry: Secondary | ICD-10-CM | POA: Diagnosis not present

## 2022-06-25 DIAGNOSIS — L97909 Non-pressure chronic ulcer of unspecified part of unspecified lower leg with unspecified severity: Secondary | ICD-10-CM

## 2022-06-25 DIAGNOSIS — M6281 Muscle weakness (generalized): Principal | ICD-10-CM | POA: Insufficient documentation

## 2022-06-25 DIAGNOSIS — Z794 Long term (current) use of insulin: Secondary | ICD-10-CM

## 2022-06-25 DIAGNOSIS — Z1152 Encounter for screening for COVID-19: Secondary | ICD-10-CM | POA: Diagnosis not present

## 2022-06-25 DIAGNOSIS — Z6841 Body Mass Index (BMI) 40.0 and over, adult: Secondary | ICD-10-CM | POA: Insufficient documentation

## 2022-06-25 DIAGNOSIS — R413 Other amnesia: Secondary | ICD-10-CM

## 2022-06-25 DIAGNOSIS — M109 Gout, unspecified: Secondary | ICD-10-CM

## 2022-06-25 DIAGNOSIS — I83009 Varicose veins of unspecified lower extremity with ulcer of unspecified site: Secondary | ICD-10-CM | POA: Insufficient documentation

## 2022-06-25 DIAGNOSIS — R8290 Unspecified abnormal findings in urine: Secondary | ICD-10-CM

## 2022-06-25 DIAGNOSIS — F319 Bipolar disorder, unspecified: Secondary | ICD-10-CM | POA: Insufficient documentation

## 2022-06-25 DIAGNOSIS — Z79899 Other long term (current) drug therapy: Secondary | ICD-10-CM | POA: Diagnosis not present

## 2022-06-25 DIAGNOSIS — Z7984 Long term (current) use of oral hypoglycemic drugs: Secondary | ICD-10-CM | POA: Diagnosis not present

## 2022-06-25 DIAGNOSIS — J45909 Unspecified asthma, uncomplicated: Secondary | ICD-10-CM | POA: Diagnosis not present

## 2022-06-25 DIAGNOSIS — E11649 Type 2 diabetes mellitus with hypoglycemia without coma: Secondary | ICD-10-CM

## 2022-06-25 DIAGNOSIS — Z91040 Latex allergy status: Secondary | ICD-10-CM | POA: Insufficient documentation

## 2022-06-25 DIAGNOSIS — E162 Hypoglycemia, unspecified: Secondary | ICD-10-CM

## 2022-06-25 DIAGNOSIS — R8271 Bacteriuria: Secondary | ICD-10-CM | POA: Insufficient documentation

## 2022-06-25 LAB — COMPREHENSIVE METABOLIC PANEL
ALT: 14 U/L (ref 0–44)
AST: 15 U/L (ref 15–41)
Albumin: 2.9 g/dL — ABNORMAL LOW (ref 3.5–5.0)
Alkaline Phosphatase: 109 U/L (ref 38–126)
Anion gap: 10 (ref 5–15)
BUN: 11 mg/dL (ref 8–23)
CO2: 20 mmol/L — ABNORMAL LOW (ref 22–32)
Calcium: 9.5 mg/dL (ref 8.9–10.3)
Chloride: 107 mmol/L (ref 98–111)
Creatinine, Ser: 0.86 mg/dL (ref 0.44–1.00)
GFR, Estimated: >60 mL/min (ref >60)
Glucose, Bld: 57 mg/dL — ABNORMAL LOW (ref 70–99)
Potassium: 3.4 mmol/L — ABNORMAL LOW (ref 3.5–5.1)
Sodium: 137 mmol/L (ref 135–145)
Total Bilirubin: 0.5 mg/dL (ref 0.3–1.2)
Total Protein: 8.5 g/dL — ABNORMAL HIGH (ref 6.5–8.1)

## 2022-06-25 LAB — ECHOCARDIOGRAM COMPLETE
AR max vel: 2.42 cm2
AV Area VTI: 2.46 cm2
AV Area mean vel: 2.35 cm2
AV Mean grad: 9 mmHg
AV Peak grad: 15.5 mmHg
Ao pk vel: 1.97 m/s
Area-P 1/2: 3.03 cm2
S' Lateral: 3.1 cm

## 2022-06-25 LAB — URINALYSIS, ROUTINE W REFLEX MICROSCOPIC
Bilirubin Urine: NEGATIVE
Glucose, UA: NEGATIVE mg/dL
Hgb urine dipstick: NEGATIVE
Ketones, ur: NEGATIVE mg/dL
Leukocytes,Ua: NEGATIVE
Nitrite: POSITIVE — AB
Protein, ur: NEGATIVE mg/dL
Specific Gravity, Urine: 1.005 (ref 1.005–1.030)
pH: 7 (ref 5.0–8.0)

## 2022-06-25 LAB — I-STAT CHEM 8, ED
BUN: 11 mg/dL (ref 8–23)
Calcium, Ion: 1.2 mmol/L (ref 1.15–1.40)
Chloride: 108 mmol/L (ref 98–111)
Creatinine, Ser: 0.8 mg/dL (ref 0.44–1.00)
Glucose, Bld: 52 mg/dL — ABNORMAL LOW (ref 70–99)
HCT: 39 % (ref 36.0–46.0)
Hemoglobin: 13.3 g/dL (ref 12.0–15.0)
Potassium: 3.5 mmol/L (ref 3.5–5.1)
Sodium: 140 mmol/L (ref 135–145)
TCO2: 21 mmol/L — ABNORMAL LOW (ref 22–32)

## 2022-06-25 LAB — CBC
HCT: 39.4 % (ref 36.0–46.0)
Hemoglobin: 12 g/dL (ref 12.0–15.0)
MCH: 26.4 pg (ref 26.0–34.0)
MCHC: 30.5 g/dL (ref 30.0–36.0)
MCV: 86.8 fL (ref 80.0–100.0)
Platelets: 464 10*3/uL — ABNORMAL HIGH (ref 150–400)
RBC: 4.54 MIL/uL (ref 3.87–5.11)
RDW: 17.5 % — ABNORMAL HIGH (ref 11.5–15.5)
WBC: 9.5 10*3/uL (ref 4.0–10.5)
nRBC: 0 % (ref 0.0–0.2)

## 2022-06-25 LAB — TROPONIN I (HIGH SENSITIVITY)
Troponin I (High Sensitivity): 185 ng/L (ref <18)
Troponin I (High Sensitivity): 197 ng/L

## 2022-06-25 LAB — MAGNESIUM: Magnesium: 1.9 mg/dL (ref 1.7–2.4)

## 2022-06-25 LAB — RESP PANEL BY RT-PCR (RSV, FLU A&B, COVID)  RVPGX2
Influenza A by PCR: NEGATIVE
Influenza B by PCR: NEGATIVE
Resp Syncytial Virus by PCR: NEGATIVE
SARS Coronavirus 2 by RT PCR: NEGATIVE

## 2022-06-25 LAB — CBG MONITORING, ED
Glucose-Capillary: 115 mg/dL — ABNORMAL HIGH (ref 70–99)
Glucose-Capillary: 103 mg/dL — ABNORMAL HIGH (ref 70–99)

## 2022-06-25 LAB — LACTIC ACID, PLASMA: Lactic Acid, Venous: 1.6 mmol/L (ref 0.5–1.9)

## 2022-06-25 LAB — GLUCOSE, CAPILLARY: Glucose-Capillary: 210 mg/dL — ABNORMAL HIGH (ref 70–99)

## 2022-06-25 MED ORDER — SODIUM CHLORIDE 0.9% FLUSH
3.0000 mL | Freq: Two times a day (BID) | INTRAVENOUS | Status: DC
  Administered 2022-06-25 – 2022-06-28 (×7): 3 mL via INTRAVENOUS

## 2022-06-25 MED ORDER — ATENOLOL 25 MG PO TABS: 25.0000 mg | ORAL_TABLET | ORAL | Status: DC

## 2022-06-25 MED ORDER — INSULIN ASPART 100 UNIT/ML IJ SOLN
0.0000 [IU] | Freq: Every day | INTRAMUSCULAR | Status: DC
  Administered 2022-06-25: 2 [IU] via SUBCUTANEOUS
  Administered 2022-06-26: 3 [IU] via SUBCUTANEOUS

## 2022-06-25 MED ORDER — ATENOLOL 50 MG PO TABS
50.0000 mg | ORAL_TABLET | Freq: Every day | ORAL | Status: DC
  Filled 2022-06-25: qty 1

## 2022-06-25 MED ORDER — ACETAMINOPHEN 650 MG RE SUPP: 650.0000 mg | Freq: Four times a day (QID) | RECTAL | Status: DC | PRN

## 2022-06-25 MED ORDER — GABAPENTIN 300 MG PO CAPS
300.0000 mg | ORAL_CAPSULE | Freq: Two times a day (BID) | ORAL | Status: DC
  Administered 2022-06-25 – 2022-06-28 (×6): 300 mg via ORAL
  Filled 2022-06-25 (×6): qty 1

## 2022-06-25 MED ORDER — ONDANSETRON HCL 4 MG PO TABS: 4.0000 mg | ORAL_TABLET | Freq: Four times a day (QID) | ORAL | Status: DC | PRN

## 2022-06-25 MED ORDER — TRAZODONE HCL 50 MG PO TABS
50.0000 mg | ORAL_TABLET | Freq: Every evening | ORAL | Status: DC | PRN
  Administered 2022-06-25 – 2022-06-27 (×3): 50 mg via ORAL
  Filled 2022-06-25 (×3): qty 1

## 2022-06-25 MED ORDER — LURASIDONE HCL 40 MG PO TABS
40.0000 mg | ORAL_TABLET | Freq: Every day | ORAL | Status: DC
  Administered 2022-06-26 – 2022-06-28 (×3): 40 mg via ORAL
  Filled 2022-06-25 (×3): qty 1

## 2022-06-25 MED ORDER — SODIUM CHLORIDE 0.9 % IV SOLN
2.0000 g | INTRAVENOUS | Status: AC
  Administered 2022-06-25 – 2022-06-27 (×3): 2 g via INTRAVENOUS
  Filled 2022-06-25 (×3): qty 20

## 2022-06-25 MED ORDER — ENOXAPARIN SODIUM 40 MG/0.4ML IJ SOSY
40.0000 mg | PREFILLED_SYRINGE | INTRAMUSCULAR | Status: DC
  Administered 2022-06-25 – 2022-06-26 (×2): 40 mg via SUBCUTANEOUS
  Filled 2022-06-25 (×2): qty 0.4

## 2022-06-25 MED ORDER — ASPIRIN 81 MG PO CHEW
81.0000 mg | CHEWABLE_TABLET | Freq: Every day | ORAL | Status: DC
  Administered 2022-06-26 – 2022-06-28 (×3): 81 mg via ORAL
  Filled 2022-06-25 (×3): qty 1

## 2022-06-25 MED ORDER — ACETAMINOPHEN 325 MG PO TABS
650.0000 mg | ORAL_TABLET | Freq: Four times a day (QID) | ORAL | Status: DC | PRN
  Administered 2022-06-25 – 2022-06-28 (×3): 650 mg via ORAL
  Filled 2022-06-25 (×4): qty 2

## 2022-06-25 MED ORDER — INSULIN GLARGINE-YFGN 100 UNIT/ML ~~LOC~~ SOLN
30.0000 [IU] | Freq: Every day | SUBCUTANEOUS | Status: DC
  Administered 2022-06-26 – 2022-06-28 (×3): 30 [IU] via SUBCUTANEOUS
  Filled 2022-06-25 (×3): qty 0.3

## 2022-06-25 MED ORDER — ASPIRIN 81 MG PO CHEW
324.0000 mg | CHEWABLE_TABLET | Freq: Once | ORAL | Status: AC
  Administered 2022-06-25: 324 mg via ORAL
  Filled 2022-06-25: qty 4

## 2022-06-25 MED ORDER — ALBUTEROL SULFATE (2.5 MG/3ML) 0.083% IN NEBU: 2.5000 mg | INHALATION_SOLUTION | Freq: Four times a day (QID) | RESPIRATORY_TRACT | Status: DC | PRN

## 2022-06-25 MED ORDER — INSULIN ASPART 100 UNIT/ML IJ SOLN
0.0000 [IU] | Freq: Three times a day (TID) | INTRAMUSCULAR | Status: DC
  Administered 2022-06-27: 2 [IU] via SUBCUTANEOUS
  Administered 2022-06-27: 5 [IU] via SUBCUTANEOUS
  Administered 2022-06-27 – 2022-06-28 (×2): 2 [IU] via SUBCUTANEOUS

## 2022-06-25 MED ORDER — ONDANSETRON HCL 4 MG/2ML IJ SOLN: 4.0000 mg | Freq: Four times a day (QID) | INTRAMUSCULAR | Status: DC | PRN

## 2022-06-25 MED ORDER — CLOPIDOGREL BISULFATE 75 MG PO TABS
75.0000 mg | ORAL_TABLET | Freq: Every morning | ORAL | Status: DC
  Administered 2022-06-25 – 2022-06-28 (×4): 75 mg via ORAL
  Filled 2022-06-25 (×4): qty 1

## 2022-06-25 MED ORDER — NIFEDIPINE ER OSMOTIC RELEASE 60 MG PO TB24
90.0000 mg | ORAL_TABLET | Freq: Every morning | ORAL | Status: DC
  Administered 2022-06-26 – 2022-06-28 (×3): 90 mg via ORAL
  Filled 2022-06-25 (×3): qty 1

## 2022-06-25 MED ORDER — CYCLOBENZAPRINE HCL 5 MG PO TABS
5.0000 mg | ORAL_TABLET | Freq: Three times a day (TID) | ORAL | Status: DC | PRN
  Administered 2022-06-27 – 2022-06-28 (×2): 5 mg via ORAL
  Filled 2022-06-25 (×2): qty 1

## 2022-06-25 MED ORDER — DONEPEZIL HCL 10 MG PO TABS
10.0000 mg | ORAL_TABLET | Freq: Every morning | ORAL | Status: DC
  Administered 2022-06-26 – 2022-06-28 (×3): 10 mg via ORAL
  Filled 2022-06-25 (×3): qty 1

## 2022-06-25 MED ORDER — POTASSIUM CHLORIDE CRYS ER 20 MEQ PO TBCR
40.0000 meq | EXTENDED_RELEASE_TABLET | Freq: Once | ORAL | Status: AC
  Administered 2022-06-25: 40 meq via ORAL
  Filled 2022-06-25: qty 2

## 2022-06-25 MED ORDER — ATENOLOL 25 MG PO TABS
25.0000 mg | ORAL_TABLET | Freq: Every day | ORAL | Status: DC
  Administered 2022-06-25: 25 mg via ORAL
  Filled 2022-06-25: qty 1

## 2022-06-25 NOTE — ED Notes (Signed)
ECHO at bedside.

## 2022-06-25 NOTE — ED Notes (Signed)
Pt adjusted in bed, soiled chuck pads changed, clean sheets and blanket  provided to pt for comfort. Pt requesting something to eat and drink. Family at bedside. Call bell in reach.

## 2022-06-25 NOTE — ED Notes (Signed)
ED TO INPATIENT HANDOFF REPORT  ED Nurse Name and Phone #:    Lorenda Ishihara  [phone]  S Name/Age/Gender [name] 72 y.o. female Room/Bed: 007C/007C  Code Status   Code Status: Full Code  Home/SNF/Other Home Patient oriented to: self Is this baseline? Yes   Triage Complete: Triage complete  Chief Complaint Elevated troponin [R79.89]  Triage Note Pt bib [facility] from home for bilateral leg pain and weakness worsening tonight. Pt with wounds on both legs that is being treated by wound care nurse - last visit 1/3 (missed visit on 1/5). Negative stroke screen w/ems. VSS w/ems.   Pt states that all over weakness started tonight. From previous strokes left sided weaker than others. PT denies pain. No swallowing difficulty. Pt does have questionable left facial droop.   Allergies Allergies  Allergen Reactions   Ace Inhibitors Swelling   Latex Itching and Swelling   Tizanidine     unk   Tramadol Other (See Comments)    UNK reaction   Metformin And Related Other (See Comments)    CHILLS   Propofol Itching    Level of Care/Admitting Diagnosis ED Disposition     ED Disposition  Admit   Condition  --   Comment  Hospital Area: [place] [[id number]]  Level of Care: Progressive [102]  Admit to Progressive based on following criteria: CARDIOVASCULAR & THORACIC of moderate stability with acute coronary syndrome symptoms/low risk myocardial infarction/hypertensive urgency/arrhythmias/heart failure potentially compromising stability and stable post cardiovascular intervention patients.  May place patient in observation at [facility] or [place] if equivalent level of care is available:: Yes  Covid Evaluation: Asymptomatic - no recent exposure (last 10 days) testing not required  Diagnosis: Elevated troponin [321909]  Admitting Physician: Dory Horn [[id number]]  Attending Physician: Dory Horn [[id number]]          B Medical/Surgery  History Past Medical History:  Diagnosis Date   Anxiety    Arthritis    "back, arms, legs" (03/24/2016)   Asthma    Chronic lower back pain    Colonic polyp    last colonoscopy done in 2009 with normal results per medical record   Depressive disorder    Gastric polyp    Gout    has taken allopurinol 300mg  once daily in past   Headache    History of hiatal hernia    Hypertension    Migraine    "none in awhile; might have a couple/year" (03/24/2016)   Mixed hyperlipidemia    01/2016 Total chol 141, HDL 59, LDL 63, ration 1.1   Osteoarthritis    TIA (transient ischemic attack) 11/2014   Type II diabetes mellitus ([place])    Past Surgical History:  Procedure Laterality Date   ARTERY BIOPSY Right 08/12/2016   Procedure: BIOPSY TEMPORAL ARTERY;  Surgeon: [name], MD;  Location: [place];  Service: Vascular;  Laterality: Right;  BIOPSY TEMPORAL ARTERY   BREAST BIOPSY Left ~ 2015   benign   CARPAL TUNNEL RELEASE Bilateral    DILATION AND CURETTAGE OF UTERUS     IR CT HEAD LTD  06/11/2022   IR PERCUTANEOUS ART THROMBECTOMY/INFUSION INTRACRANIAL INC DIAG ANGIO  06/11/2022   IR US GUIDE VASC ACCESS RIGHT  06/11/2022   KNEE ARTHROSCOPY Right 2003   in [place] N/A 06/11/2022   Procedure: IR WITH ANESTHESIA;  Surgeon: Radiologist, Medication, MD;  Location: [place];  Service:  Radiology;  Laterality: N/A;   TUBAL LIGATION     VAGINAL HYSTERECTOMY  1982     A IV Location/Drains/Wounds Patient Lines/Drains/Airways Status     Active Line/Drains/Airways     Name Placement date Placement time Site Days   Peripheral IV 06/25/22 20 G Left Antecubital 06/25/22  0635  Antecubital  less than 1   Wound / Incision (Open or Dehisced) 06/11/22 Venous stasis ulcer Foot Anterior;Right 06/11/22  1428  Foot  14   Wound / Incision (Open or Dehisced) 06/11/22 Venous stasis ulcer Ankle Anterior;Right 06/11/22  1630  Ankle  14   Wound / Incision  (Open or Dehisced) 06/11/22 Venous stasis ulcer Foot Anterior;Left 06/11/22  1428  Foot  14   Wound / Incision (Open or Dehisced) 06/11/22 Venous stasis ulcer Foot Anterior;Left;Medial 06/11/22  1428  Foot  14   Wound / Incision (Open or Dehisced) 06/12/22 Venous stasis ulcer;Diabetic ulcer Ankle Left;Posterior 06/12/22  0800  Ankle  13            Intake/Output Last 24 hours  Intake/Output Summary (Last 24 hours) at 06/25/2022 1725 Last data filed at 06/25/2022 1658 Gross per 24 hour  Intake 95.87 ml  Output --  Net 95.87 ml    Labs/Imaging Results for orders placed or performed during the hospital encounter of 06/25/22 (from the past 48 hour(s))  Comprehensive metabolic panel     Status: Abnormal   Collection Time: 06/25/22  3:29 AM  Result Value Ref Range   Sodium 137 135 - 145 mmol/L   Potassium 3.4 (L) 3.5 - 5.1 mmol/L   Chloride 107 98 - 111 mmol/L   CO2 20 (L) 22 - 32 mmol/L   Glucose, Bld 57 (L) 70 - 99 mg/dL    Comment: Glucose reference range applies only to samples taken after fasting for at least 8 hours.   BUN 11 8 - 23 mg/dL   Creatinine, Ser 2.92 0.44 - 1.00 mg/dL   Calcium 9.5 8.9 - 44.6 mg/dL   Total Protein 8.5 (H) 6.5 - 8.1 g/dL   Albumin 2.9 (L) 3.5 - 5.0 g/dL   AST 15 15 - 41 U/L   ALT 14 0 - 44 U/L   Alkaline Phosphatase 109 38 - 126 U/L   Total Bilirubin 0.5 0.3 - 1.2 mg/dL   GFR, Estimated >28 >63 mL/min    Comment: (NOTE) Calculated using the CKD-EPI Creatinine Equation (2021)    Anion gap 10 5 - 15    Comment: Performed at [facility], [address] [place] [address], [place], [place] [address]  CBC     Status: Abnormal   Collection Time: 06/25/22  3:29 AM  Result Value Ref Range   WBC 9.5 4.0 - 10.5 K/uL   RBC 4.54 3.87 - 5.11 MIL/uL   Hemoglobin 12.0 12.0 - 15.0 g/dL   HCT 16.5 79.0 - 38.3 %   MCV 86.8 80.0 - 100.0 fL   MCH 26.4 26.0 - 34.0 pg   MCHC 30.5 30.0 - 36.0 g/dL   RDW 33.8 (H) 32.9 - 19.1 %   Platelets 464 (H) 150 - 400 K/uL   nRBC  0.0 0.0 - 0.2 %    Comment: Performed at [facility], [address], [place], [place] [address]  Lactic acid, plasma     Status: None   Collection Time: 06/25/22  3:29 AM  Result Value Ref Range   Lactic Acid, Venous 1.6 0.5 - 1.9 mmol/L  Comment: Performed at [facility], [address], [place], [place] [address]  Blood culture (routine x 2)     Status: None (Preliminary result)   Collection Time: 06/25/22  3:29 AM   Specimen: BLOOD  Result Value Ref Range   Specimen Description BLOOD RIGHT ANTECUBITAL    Special Requests AEROBIC BOTTLE ONLY Blood Culture adequate volume    Culture      NO GROWTH < 12 HOURS Performed at [facility], [address], [place], [place] [address]    Report Status PENDING   Troponin I (High Sensitivity)     Status: Abnormal   Collection Time: 06/25/22  3:29 AM  Result Value Ref Range   Troponin I (High Sensitivity) 185 (HH) <18 ng/L    Comment: CRITICAL RESULT CALLED TO, READ BACK BY AND VERIFIED WITH ALISON [facility] RN 06/25/22 0615 M KOROLESKI (NOTE) Elevated high sensitivity troponin I (hsTnI) values and significant  changes across serial measurements may suggest ACS but many other  chronic and acute conditions are known to elevate hsTnI results.  Refer to the "Links" section for chest pain algorithms and additional  guidance. Performed at [facility], [address], [place], [place] [address]   Magnesium     Status: None   Collection Time: 06/25/22  3:29 AM  Result Value Ref Range   Magnesium 1.9 1.7 - 2.4 mg/dL    Comment: Performed at [facility], [address] [place], [place] [address]  Resp panel by RT-PCR (RSV, Flu A&B, Covid) Anterior Nasal Swab     Status: None   Collection Time: 06/25/22  4:29 AM   Specimen: Anterior Nasal Swab  Result Value Ref Range   SARS Coronavirus 2 by RT PCR NEGATIVE NEGATIVE    Comment: (NOTE) SARS-CoV-2 target nucleic acids are NOT DETECTED.  The SARS-CoV-2 RNA  is generally detectable in upper respiratory specimens during the acute phase of infection. The lowest concentration of SARS-CoV-2 viral copies this assay can detect is 138 copies/mL. A negative result does not preclude SARS-Cov-2 infection and should not be used as the sole basis for treatment or other patient management decisions. A negative result may occur with  improper specimen collection/handling, submission of specimen other than nasopharyngeal swab, presence of viral mutation(s) within the areas targeted by this assay, and inadequate number of viral copies(<138 copies/mL). A negative result must be combined with clinical observations, patient history, and epidemiological information. The expected result is Negative.  Fact Sheet for Patients:  BloggerCourse.com  Fact Sheet for Healthcare Providers:  SeriousBroker.it  This test is no t yet approved or cleared by the [place] FDA and  has been authorized for detection and/or diagnosis of SARS-CoV-2 by FDA under an Emergency Use Authorization (EUA). This EUA will remain  in effect (meaning this test can be used) for the duration of the COVID-19 declaration under Section 564(b)(1) of the Act, 21 U.S.C.section 360bbb-3(b)(1), unless the authorization is terminated  or revoked sooner.       Influenza A by PCR NEGATIVE NEGATIVE   Influenza B by PCR NEGATIVE NEGATIVE    Comment: (NOTE) The Xpert Xpress SARS-CoV-2/FLU/RSV plus assay is intended as an aid in the diagnosis of influenza from Nasopharyngeal swab specimens and should not be used as a sole basis for treatment. Nasal washings and aspirates are unacceptable for Xpert Xpress SARS-CoV-2/FLU/RSV testing.  Fact Sheet for Patients: BloggerCourse.com  Fact Sheet for Healthcare Providers: SeriousBroker.it  This test is not  yet approved or cleared by the [place]  and has been authorized for detection and/or diagnosis of SARS-CoV-2 by FDA under an Emergency Use Authorization (EUA). This EUA will remain in effect (meaning this test can be used) for the duration of the COVID-19 declaration under Section 564(b)(1) of the Act, 21 U.S.C. section 360bbb-3(b)(1), unless the authorization is terminated or revoked.     Resp Syncytial Virus by PCR NEGATIVE NEGATIVE    Comment: (NOTE) Fact Sheet for Patients: BloggerCourse.com  Fact Sheet for Healthcare Providers: SeriousBroker.it  This test is not yet approved or cleared by the [place] FDA and has been authorized for detection and/or diagnosis of SARS-CoV-2 by FDA under an Emergency Use Authorization (EUA). This EUA will remain in effect (meaning this test can be used) for the duration of the COVID-19 declaration under Section 564(b)(1) of the Act, 21 U.S.C. section 360bbb-3(b)(1), unless the authorization is terminated or revoked.  Performed at [facility], [address] [place], [place] [address]   I-stat chem 8, ED (not at [facility], [facility] or [facility])     Status: Abnormal   Collection Time: 06/25/22  4:33 AM  Result Value Ref Range   Sodium 140 135 - 145 mmol/L   Potassium 3.5 3.5 - 5.1 mmol/L   Chloride 108 98 - 111 mmol/L   BUN 11 8 - 23 mg/dL   Creatinine, Ser 0.16 0.44 - 1.00 mg/dL   Glucose, Bld 52 (L) 70 - 99 mg/dL    Comment: Glucose reference range applies only to samples taken after fasting for at least 8 hours.   Calcium, Ion 1.20 1.15 - 1.40 mmol/L   TCO2 21 (L) 22 - 32 mmol/L   Hemoglobin 13.3 12.0 - 15.0 g/dL   HCT 55.3 74.8 - 27.0 %  POC CBG, ED     Status: Abnormal   Collection Time: 06/25/22  6:18 AM  Result Value Ref Range   Glucose-Capillary 115 (H) 70 - 99 mg/dL    Comment: Glucose reference range applies only to samples taken after fasting for at least 8 hours.  Blood culture (routine x 2)     Status: None  (Preliminary result)   Collection Time: 06/25/22  6:27 AM   Specimen: BLOOD LEFT FOREARM  Result Value Ref Range   Specimen Description BLOOD LEFT FOREARM    Special Requests      BOTTLES DRAWN AEROBIC AND ANAEROBIC Blood Culture results may not be optimal due to an inadequate volume of blood received in culture bottles   Culture      NO GROWTH < 12 HOURS Performed at [facility], [address] [place], [place] [address]    Report Status PENDING   Troponin I (High Sensitivity)     Status: Abnormal   Collection Time: 06/25/22  6:27 AM  Result Value Ref Range   Troponin I (High Sensitivity) 197 (HH) <18 ng/L    Comment: CRITICAL VALUE NOTED. VALUE IS CONSISTENT WITH PREVIOUSLY REPORTED/CALLED VALUE (NOTE) Elevated high sensitivity troponin I (hsTnI) values and significant  changes across serial measurements may suggest ACS but many other  chronic and acute conditions are known to elevate hsTnI results.  Refer to the "Links" section for chest pain algorithms and additional  guidance. Performed at [facility], [address] [place], [place] [address]   Urinalysis, Routine w reflex microscopic     Status: Abnormal   Collection Time: 06/25/22 12:30 PM  Result Value Ref Range  Color, Urine YELLOW YELLOW   APPearance CLEAR CLEAR   Specific Gravity, Urine 1.005 1.005 - 1.030   pH 7.0 5.0 - 8.0   Glucose, UA NEGATIVE NEGATIVE mg/dL   Hgb urine dipstick NEGATIVE NEGATIVE   Bilirubin Urine NEGATIVE NEGATIVE   Ketones, ur NEGATIVE NEGATIVE mg/dL   Protein, ur NEGATIVE NEGATIVE mg/dL   Nitrite POSITIVE (A) NEGATIVE   Leukocytes,Ua NEGATIVE NEGATIVE   RBC / HPF 0-5 0 - 5 RBC/hpf   WBC, UA 0-5 0 - 5 WBC/hpf   Bacteria, UA MANY (A) NONE SEEN   Squamous Epithelial / HPF 0-5 0 - 5 /HPF    Comment: Performed at [facility], [address], [place], [place] [address]  CBG monitoring, ED     Status: Abnormal   Collection Time: 06/25/22  4:55 PM  Result Value Ref  Range   Glucose-Capillary 103 (H) 70 - 99 mg/dL    Comment: Glucose reference range applies only to samples taken after fasting for at least 8 hours.   CT HEAD WO CONTRAST ( )  Result Date: 06/25/2022 CLINICAL DATA:  generalized weakness, recent stroke EXAM: CT HEAD WITHOUT CONTRAST TECHNIQUE: Contiguous axial images were obtained from the base of the skull through the vertex without intravenous contrast. RADIATION DOSE REDUCTION: This exam was performed according to the departmental dose-optimization program which includes automated exposure control, adjustment of the mA and/or kV according to patient size and/or use of iterative reconstruction technique. COMPARISON:  06/11/2022 FINDINGS: Brain: Normal anatomic configuration. Parenchymal volume loss is commensurate with the patient's age. Mild periventricular white matter changes are present likely reflecting the sequela of small vessel ischemia. Previously noted region of hypodensity involving the right insular cortex and temporal operculum has largely resolved with a few small regions of cortical hypodensity within the insular cortex in keeping with subacute infarct. No abnormal intra or extra-axial mass lesion or fluid collection. No abnormal mass effect or midline shift. No evidence of acute intracranial hemorrhage or infarct. Ventricular size is normal. Cerebellum unremarkable. Vascular: No asymmetric hyperdense vasculature at the skull base. Skull: Intact Sinuses/Orbits: Paranasal sinuses are clear. Orbits are unremarkable. Other: Mastoid air cells and middle ear cavities are clear. IMPRESSION: 1. No acute intracranial hemorrhage or infarct. 2. 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 3. Mild senescent change. Electronically Signed   By: Helyn Numbers M.D.   On: 06/25/2022 04:20   DG Chest 2 View  Result Date: 06/25/2022 CLINICAL DATA:  Generalized weakness. EXAM: CHEST - 2 VIEW COMPARISON:  Portable  chest 06/21/2019, CTA chest 06/22/2019 preop FINDINGS: Limited exam due to low inspiratory effort and habitus. There is central bronchial thickening consistent with bronchitis and increased perihilar interstitial lung markings which may suggest viral bronchiolitis. Other interstitial pneumonitis is possible. Findings could also be due to low lung volumes. The peripheral lungs are clear. The sulci are sharp. Heart size and vasculature are normal with unremarkable mediastinum. Thoracic spondylosis. Follow-up study is recommended in full inspiration. IMPRESSION: There is central bronchial thickening consistent with bronchitis and increased perihilar interstitial lung markings which may suggest viral bronchiolitis. Other interstitial pneumonitis is possible. Findings could also be due to low lung volumes. A follow-up study is recommended with PA and lateral views in full inspiration. Electronically Signed   By: Almira Bar M.D.   On: 06/25/2022 03:59    Pending Labs Unresulted Labs (From admission, onward)     Start     Ordered   06/26/22 0500  CBC  Tomorrow morning,   R        06/25/22 0804   06/26/22 1694  Basic metabolic panel  Tomorrow morning,   R        06/25/22 0804   06/25/22 1442  Urine Culture  (Urine Culture)  Once,   R       Question:  Indication  Answer:  Altered mental status (if no other cause identified)   06/25/22 1441   06/25/22 0324  Lactic acid, plasma  Now then every 2 hours,   R      06/25/22 0324            Vitals/Pain Today's Vitals   06/25/22 1200 06/25/22 1300 06/25/22 1400 06/25/22 1630  BP: (!) 117/104 (!) 113/91 (!) 129/55 (!) 140/65  Pulse: 68 80 65 70  Resp: (!) 22 14 (!) 23 18  Temp:   98 F (36.7 C) 97.8 F (36.6 C)  TempSrc:   Oral Oral  SpO2: 100% 100% 100% 100%  PainSc:        Isolation Precautions Airborne and Contact precautions  Medications Medications  enoxaparin (LOVENOX) injection 40 mg (40 mg Subcutaneous Given 06/25/22 0954)  sodium  chloride flush (NS) 0.9 % injection 3 mL (3 mLs Intravenous Given 06/25/22 1627)  acetaminophen (TYLENOL) tablet 650 mg (has no administration in time range)    Or  acetaminophen (TYLENOL) suppository 650 mg (has no administration in time range)  ondansetron (ZOFRAN) tablet 4 mg (has no administration in time range)    Or  ondansetron (ZOFRAN) injection 4 mg (has no administration in time range)  albuterol (PROVENTIL) (2.5 MG/3ML) 0.083% nebulizer solution 2.5 mg (has no administration in time range)  cefTRIAXone (ROCEPHIN) 2 g in sodium chloride 0.9 % 100 mL IVPB (0 g Intravenous Stopped 06/25/22 1658)  insulin aspart (novoLOG) injection 0-15 Units ( Subcutaneous Not Given 06/25/22 1723)  insulin aspart (novoLOG) injection 0-5 Units (has no administration in time range)  potassium chloride SA (KLOR-CON M) CR tablet 40 mEq (40 mEq Oral Given 06/25/22 0648)  aspirin chewable tablet 324 mg (324 mg Oral Given 06/25/22 0648)    Mobility non-ambulatory High fall risk   Focused Assessments Neuro Assessment Handoff:  Swallow screen pass? Yes          Neuro Assessment: Exceptions to [facility] Neuro Checks:      Has TPA been given? No If patient is a Neuro Trauma and patient is going to OR before floor call report to [place] nurse: [phone] or [phone]   R Recommendations: See Admitting Provider Note  Report given to:   Additional Notes:

## 2022-06-25 NOTE — ED Provider Notes (Signed)
[facility] EMERGENCY DEPARTMENT Provider Note   CSN: [id number] Arrival date & time: 06/25/22  0302     History  Chief Complaint  Patient presents with   Extremity Weakness    [name] is a 72 y.o. female. 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  Patient went home on January 1 from the hospital after recent stroke.  She does feel like her symptoms from the stroke have been improving however she never went to rehab as suggested by her admitting doctors during recent admission.  She gets around using a power wheelchair however she was just too generally fatigued and weak and unable to get out of her chair today.  Her son was concerned she was having another stroke.  However she denied any headache, slurred speech, focal weakness or other complaints.  She has been eating and drinking okay however she had not been eating or drinking in the past 24 hours.  She has very little help at home to get around and has not seen any rehab.   Extremity Weakness       Home Medications Prior to Admission medications   Medication Sig Start Date End Date Taking? Authorizing Provider  acetaminophen (TYLENOL) 500 MG tablet Take 500 mg by mouth daily as needed for moderate pain.    [provider]  allopurinol (ZYLOPRIM) 300 MG tablet Take 300 mg by mouth daily.    [provider]  aspirin 81 MG chewable tablet Chew 1 tablet (81 mg total) by mouth daily. 06/17/22   Lynnae January, NP  atenolol (TENORMIN) 25 MG tablet Take 25-50 mg by mouth See admin instructions. Take 50 mg every morning and then 25 mg every evening    [provider]  clopidogrel (PLAVIX) 75 MG tablet Take 1 tablet (75 mg total) by mouth daily. 06/17/22   Lynnae January, NP  clotrimazole (LOTRIMIN) 1 % cream Apply 1  Application topically 2 (two) times daily as needed (rash). 03/18/22   [provider]  cyclobenzaprine (FLEXERIL) 5 MG tablet Take 5 mg by mouth 3 (three) times daily as needed for muscle spasms. 06/03/22   [provider]  donepezil (ARICEPT) 10 MG tablet Take 10 mg by mouth at bedtime.    [provider]  furosemide (LASIX) 20 MG tablet Take 1 tablet (20 mg total) by mouth daily. Patient taking differently: Take 40 mg by mouth daily. 07/07/16   Leroy Sea, MD  gabapentin (NEURONTIN) 300 MG capsule Take 2 capsules (600 mg total) by mouth 2 (two) times daily. Patient taking differently: Take 300-600 mg by mouth 2 (two) times daily. 02/08/21   Sabino Dick, DO  glipiZIDE (GLUCOTROL) 10 MG tablet Take 10 mg by mouth 2 (two) times daily before a meal.    [provider]  hydrocerin (EUCERIN) CREA Apply 1 application topically daily. Patient taking differently: Apply 1 application  topically daily as needed (dry skin). 01/15/16   Leroy Sea, MD  insulin glargine (LANTUS) 100 UNIT/ML Solostar Pen Inject 70 Units into the skin every morning. And pen needles 1/day 05/21/21   Romero Belling, MD  lurasidone (LATUDA) 20 MG TABS tablet Take 1 tablet (20 mg total) by mouth daily with breakfast for 14 days. Patient not taking: Reported on 06/12/2022 06/02/22 06/16/22  Bobbye Morton, MD  lurasidone (LATUDA)  40 MG TABS tablet Take 1 tablet (40 mg total) by mouth daily with breakfast. 06/16/22   Freida Busman, MD  polyethylene glycol (MIRALAX / GLYCOLAX) packet Take 17 g by mouth daily as needed for moderate constipation.    [provider]  traZODone (DESYREL) 50 MG tablet Take 1 tablet (50 mg total) by mouth at bedtime and may repeat dose one time if needed. Patient taking differently: Take 50 mg by mouth at bedtime as needed for sleep. 06/02/22   Freida Busman, MD      Allergies    Ace inhibitors, Latex, Tizanidine, Tramadol, Metformin and related,  and Propofol    Review of Systems   Review of Systems  Musculoskeletal:  Positive for extremity weakness.    Physical Exam Updated Vital Signs BP 136/69   Pulse 75   Temp 98.2 F (36.8 C) (Oral)   Resp (!) 23   SpO2 97%  Physical Exam Constitutional: Alert and orientedx4, NAD, nontoxic Eyes: Conjunctivae are normal. ENT      Head: Normocephalic and atraumatic.      Nose: No congestion.      Mouth/Throat: Mucous membranes are moist.      Neck: No stridor. Cardiovascular: S1, S2,  regular rate, distal extremities warm and dry Respiratory: Normal respiratory effort. Breath sounds are normal. O2 sat 97 on RA Gastrointestinal: Soft and nontender.  Musculoskeletal:  Moving all 4 extremities, significant venous stasis changes of the lower extremities with associated ulcerations as can be seen in media however no active purulent drainage, no erythema or warmth or streaking, no crepitus  Neurologic: Normal speech and language. Slight left corner facial droop, EOMI, PERRL, tongue midline, facial sensation and extremity sensation grossly intact.  No drift of upper extremities.  5 out of 5 strength right  upper extremities and LUE 4+/5 strength.  Able to raise and hold both lower extremities against gravity and mild pressure. Skin: significant venous stasis changes of the lower extremities with associated ulcerations as can be seen in media however no active purulent drainage, no erythema or warmth or streaking, no crepitus  Psychiatric: Mood and affect slightly subdued        ED Results / Procedures / Treatments   Labs (all labs ordered are listed, but only abnormal results are displayed) Labs Reviewed  COMPREHENSIVE METABOLIC PANEL - Abnormal; Notable for the following components:      Result Value   Potassium 3.4 (*)    CO2 20 (*)    Glucose, Bld 57 (*)    Total Protein 8.5 (*)    Albumin 2.9 (*)    All other components within normal limits  CBC - Abnormal; Notable for the  following components:   RDW 17.5 (*)    Platelets 464 (*)    All other components within normal limits  I-STAT CHEM 8, ED - Abnormal; Notable for the following components:   Glucose, Bld 52 (*)    TCO2 21 (*)    All other components within normal limits  CBG MONITORING, ED - Abnormal; Notable for the following components:   Glucose-Capillary 115 (*)    All other components within normal limits  TROPONIN I (HIGH SENSITIVITY) - Abnormal; Notable for the following components:   Troponin I (High Sensitivity) 185 (*)    All other components within normal limits  RESP PANEL BY RT-PCR (RSV, FLU A&B, COVID)  RVPGX2  CULTURE, BLOOD (ROUTINE X 2)  CULTURE, BLOOD (ROUTINE X 2)  LACTIC ACID, PLASMA  MAGNESIUM  LACTIC ACID, PLASMA  URINALYSIS, ROUTINE W REFLEX MICROSCOPIC  TROPONIN I (HIGH SENSITIVITY)    EKG EKG Interpretation  Date/Time:  Wednesday June 25 2022 04:24:12 EST Ventricular Rate:  81 PR Interval:  149 QRS Duration: 87 QT Interval:  391 QTC Calculation: 268 R Axis:   -6 Text Interpretation: Sinus rhythm Low voltage, precordial leads Left ventricular hypertrophy No significant change since last tracing Confirmed by Georgina Snell [phone]) on 06/25/2022 4:33:00 AM  Radiology CT HEAD WO CONTRAST (5MM)  Result Date: 06/25/2022 CLINICAL DATA:  generalized weakness, recent stroke EXAM: CT HEAD WITHOUT CONTRAST TECHNIQUE: Contiguous axial images were obtained from the base of the skull through the vertex without intravenous contrast. RADIATION DOSE REDUCTION: This exam was performed according to the departmental dose-optimization program which includes automated exposure control, adjustment of the mA and/or kV according to patient size and/or use of iterative reconstruction technique. COMPARISON:  06/11/2022 FINDINGS: Brain: Normal anatomic configuration. Parenchymal volume loss is commensurate with the patient's age. Mild periventricular white matter changes are present likely  reflecting the sequela of small vessel ischemia. Previously noted region of hypodensity involving the right insular cortex and temporal operculum has largely resolved with a few small regions of cortical hypodensity within the insular cortex in keeping with subacute infarct. No abnormal intra or extra-axial mass lesion or fluid collection. No abnormal mass effect or midline shift. No evidence of acute intracranial hemorrhage or infarct. Ventricular size is normal. Cerebellum unremarkable. Vascular: No asymmetric hyperdense vasculature at the skull base. Skull: Intact Sinuses/Orbits: Paranasal sinuses are clear. Orbits are unremarkable. Other: Mastoid air cells and middle ear cavities are clear. IMPRESSION: 1. No acute intracranial hemorrhage or infarct. 2. 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 3. Mild senescent change. Electronically Signed   By: Fidela Salisbury M.D.   On: 06/25/2022 04:20   DG Chest 2 View  Result Date: 06/25/2022 CLINICAL DATA:  Generalized weakness. EXAM: CHEST - 2 VIEW COMPARISON:  Portable chest 06/21/2019, CTA chest 06/22/2019 preop FINDINGS: Limited exam due to low inspiratory effort and habitus. There is central bronchial thickening consistent with bronchitis and increased perihilar interstitial lung markings which may suggest viral bronchiolitis. Other interstitial pneumonitis is possible. Findings could also be due to low lung volumes. The peripheral lungs are clear. The sulci are sharp. Heart size and vasculature are normal with unremarkable mediastinum. Thoracic spondylosis. Follow-up study is recommended in full inspiration. IMPRESSION: There is central bronchial thickening consistent with bronchitis and increased perihilar interstitial lung markings which may suggest viral bronchiolitis. Other interstitial pneumonitis is possible. Findings could also be due to low lung volumes. A follow-up study is recommended with PA and lateral views  in full inspiration. Electronically Signed   By: Telford Nab M.D.   On: 06/25/2022 03:59    Procedures Procedures  Remained on the cardiac monitoring normal sinus rhythm with normal rates for majority of time. Medications Ordered in ED Medications  potassium chloride SA (KLOR-CON M) CR tablet 40 mEq (40 mEq Oral Given 06/25/22 0648)  aspirin chewable tablet 324 mg (324 mg Oral Given 06/25/22 2229)    ED Course/ Medical Decision Making/ A&P Clinical Course as of 06/25/22 7989  Wed Jun 25, 2022  0622 Spoke with Doreene Adas of Cardiology, she recommends medical admission at this time for continued trending of troponins and inpatient echocardiogram.  She will be seen by the cardiology team and have formal consult note written. No need for heparin at this time. [VB]  Clinical Course User Index [VB] Mardene Sayer, MD                           Medical Decision Making NANI INGRAM is a 72 y.o. female. 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  Based on patient's presentation, consider acute electrolyte abnormalities, atypical ACS, infection, dehydration among multiple other etiologies.  Very low suspicion for new CVA or ICH as Patient's neurologic exam is overall very reassuring and actually improved since recent stroke.    CT head obtained today with expected evolving changes from recent stroke but no evidence of ICH.  Labs remarkable for mild hypoglycemia 57 which improved with p.o. intake.  Mild hypokalemia 3.4.  Patient was afebrile and hemodynamically stable however basic infectious workup obtained with no evidence of COVID RSV or flu, chest x-ray suggestive of mild bronchiolitis however patient asymptomatic.  She has chronic changes and ulceration of her lower extremities consistent with known venous stasis but  there are no acute findings today suggestive of cellulitis.  EKG obtained which I personally reviewed normal sinus rhythm with no acute ST/T changes from prior.  However high-sensitivity troponin 185.  Patient denying cardiopulmonary complaint such as chest pain or shortness of breath however with her age and underlying risk factors could be presentation of atypical ACS.  Will give aspirin 324 mg.  Of note, she was also supposed to have loop recorder due to recent stroke however was never performed. spoke with Bettina Gavia of Cardiology, she recommends medical admission at this time for continued trending of troponins and inpatient echocardiogram.  She will be seen by the cardiology team and have formal consult note written. No need for heparin at this time.  Spoke with hospitalist Dr Loney Loh for admission for continued trending troponins, ECHO, CBG monitoring, inpatient PT/OT.  Amount and/or Complexity of Data Reviewed Labs: ordered.  Risk OTC drugs. Prescription drug management. Decision regarding hospitalization.    Final Clinical Impression(s) / ED Diagnoses Final diagnoses:  Weakness  Hypokalemia  Hypoglycemia  Elevated troponin    Rx / DC Orders ED Discharge Orders     None         Mardene Sayer, MD 06/25/22 [phone]

## 2022-06-25 NOTE — ED Notes (Signed)
Perineal care performed. Patient able to assist by rolling left to right.

## 2022-06-25 NOTE — Consult Note (Addendum)
Cardiology Consultation   Patient ID: [name] MRN: [id number]; DOB: [birth year]  Admit date: 06/25/2022 Date of Consult: 06/25/2022  PCP:  Hilton Cork, PA-C   [facility] Providers Cardiologist:  Charlton Haws, MD   New    Patient Profile:   [name] is a 72 y.o. 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 Katrinka Blazing.  History of Present Illness:   Ms. [name] is a 72 year old female with above medical history. Per chart review, patient does not follow with a cardiologist.   Patient was recently admitted to [facility] from 06/11/22 - 06/16/22 for treatment of CVA. Patient had presented to the hospital complaining of left sided weakness. Found to have an acute right MCA ischemic infarct with R M2 occlusion. She was emergently taken to IR for a mechanical thrombectomy of her right M2. Echocardiogram on 06/11/22 showed EF 65-70%, no regional wall motion abnormalities, mild LVH, normal RV systolic function, borderline dilation of the ascending aorta measuring 39 mm. Neurology recommended loop recorder, but patient declined. Instead, patient agreed to wearing a zio patch. Does not appear that zio patch has been sent to her home yet. Discharged on DAPT with ASA and plavix for 3 weeks to be followed by ASA monotherapy.   Patient presented to the ED on 1/10 complaining of general fatigue and weakness. Patient usually gets around using a power wheelchair, and she was unable to get out of the wheelchair due to weakness. Denied headache, slurred speech, focal weakness. Noted that she had not been eating or drinking in the past 24 hours. CTA head showed no acute intracranial hemorrhage or infarct, and an evolving subacute infarct involving the right insular cortex  and temporal operculum. Labs in the ED showed hsTn 185>197. EKG showed normal sinus rhythm, inverted T waves in lead III (overall unchanged compared to previous EKG from 10/2021).   On interview, patient reports that she is unsure why she came into the ER.  She says that she must of called her son and told him that she felt bad, so he called EMS for her.  She denies chest pain, palpitations, shortness of breath, syncope/near syncope, dizziness, nausea, vomiting, fever, chills, body aches, cough, nasal congestion.  Reports that she has been eating and drinking enough in the past few days.  Believes that she has been getting stronger since being discharged from the hospital after her stroke.  Has not yet received a cardiac monitor.  Her husband came into the room about halfway through my evaluation.  Reports that she came to the hospital because of left leg weakness.  Currently, she does not have weakness and is in her usual state of health.  Patient reports that she has not been compliant with aspirin or Plavix since being discharged from the hospital  Past Medical History:  Diagnosis Date   Anxiety    Arthritis    "back, arms, legs" (03/24/2016)   Asthma    Chronic lower back pain    Colonic polyp    last colonoscopy done in 2009 with normal results per medical record   Depressive disorder    Gastric polyp    Gout    has taken allopurinol 300mg  once daily in past   Headache    History  of hiatal hernia    Hypertension    Migraine    "none in awhile; might have a couple/year" (03/24/2016)   Mixed hyperlipidemia    01/2016 Total chol 141, HDL 59, LDL 63, ration 1.1   Osteoarthritis    TIA (transient ischemic attack) 11/2014   Type II diabetes mellitus ([place])     Past Surgical History:  Procedure Laterality Date   ARTERY BIOPSY Right 08/12/2016   Procedure: BIOPSY TEMPORAL ARTERY;  Surgeon: [name], MD;  Location: [place];  Service: Vascular;  Laterality: Right;  BIOPSY TEMPORAL ARTERY    BREAST BIOPSY Left ~ 2015   benign   CARPAL TUNNEL RELEASE Bilateral    DILATION AND CURETTAGE OF UTERUS     IR CT HEAD LTD  06/11/2022   IR PERCUTANEOUS ART THROMBECTOMY/INFUSION INTRACRANIAL INC DIAG ANGIO  06/11/2022   IR US GUIDE VASC ACCESS RIGHT  06/11/2022   KNEE ARTHROSCOPY Right 2003   in [place] N/A 06/11/2022   Procedure: IR WITH ANESTHESIA;  Surgeon: Radiologist, Medication, MD;  Location: [place];  Service: Radiology;  Laterality: N/A;   TUBAL LIGATION     VAGINAL HYSTERECTOMY  1982     Home Medications:  Prior to Admission medications   Medication Sig Start Date End Date Taking? Authorizing Provider  allopurinol (ZYLOPRIM) 300 MG tablet Take 300 mg by mouth daily.   Yes [provider]  aspirin 81 MG chewable tablet Chew 1 tablet (81 mg total) by mouth daily. 06/17/22  Yes Lovey Newcomer C, NP  atenolol (TENORMIN) 25 MG tablet Take 25-50 mg by mouth See admin instructions. Take 50 mg every morning and then 25 mg every evening   Yes [provider]  clopidogrel (PLAVIX) 75 MG tablet Take 1 tablet (75 mg total) by mouth daily. 06/17/22  Yes Lovey Newcomer C, NP  donepezil (ARICEPT) 10 MG tablet Take 10 mg by mouth at bedtime.   Yes [provider]  gabapentin (NEURONTIN) 300 MG capsule Take 2 capsules (600 mg total) by mouth 2 (two) times daily. Patient taking differently: Take 300-600 mg by mouth 2 (two) times daily. 02/08/21  Yes Sharion Settler, DO  acetaminophen (TYLENOL) 500 MG tablet Take 500 mg by mouth daily as needed for moderate pain.    [provider]  clotrimazole (LOTRIMIN) 1 % cream Apply 1 Application topically 2 (two) times daily as needed (rash). 03/18/22   [provider]  cyclobenzaprine (FLEXERIL) 5 MG tablet Take 5 mg by mouth 3 (three) times daily as needed for muscle spasms. 06/03/22   [provider]  furosemide (LASIX) 20 MG tablet Take 1 tablet  (20 mg total) by mouth daily. Patient taking differently: Take 40 mg by mouth daily. 07/07/16   Thurnell Lose, MD  glipiZIDE (GLUCOTROL) 10 MG tablet Take 10 mg by mouth 2 (two) times daily before a meal.    [provider]  hydrocerin (EUCERIN) CREA Apply 1 application topically daily. Patient taking differently: Apply 1 application  topically daily as needed (dry skin). 01/15/16   Thurnell Lose, MD  insulin glargine (LANTUS) 100 UNIT/ML Solostar Pen Inject 70 Units into the skin every morning. And pen needles 1/day 05/21/21   Renato Shin, MD  lurasidone (LATUDA) 20 MG TABS tablet Take 1 tablet (20 mg total) by mouth daily with breakfast for 14 days. Patient not taking: Reported on 06/12/2022 06/02/22 06/16/22  Freida Busman, MD  lurasidone (LATUDA) 40 MG TABS tablet Take 1 tablet (40 mg total) by mouth daily with breakfast. 06/16/22   Freida Busman, MD  polyethylene glycol (MIRALAX / GLYCOLAX) packet Take 17 g by mouth daily as needed for moderate constipation.    [provider]  traZODone (DESYREL) 50 MG tablet Take 1 tablet (50 mg total) by mouth at bedtime and may repeat dose one time if needed. Patient not taking: Reported on 06/25/2022 06/02/22   Freida Busman, MD    Inpatient Medications: Scheduled Meds:  enoxaparin (LOVENOX) injection  40 mg Subcutaneous Q24H   sodium chloride flush  3 mL Intravenous Q12H   Continuous Infusions:  PRN Meds: acetaminophen **OR** acetaminophen, albuterol, ondansetron **OR** ondansetron (ZOFRAN) IV  Allergies:    Allergies  Allergen Reactions   Ace Inhibitors Swelling   Latex Itching and Swelling   Tizanidine     unk   Tramadol Other (See Comments)    UNK reaction   Metformin And Related Other (See Comments)    CHILLS   Propofol Itching    Social History:   Social History   Socioeconomic History   Marital status: Married    Spouse name: Not on file   Number of children: Not on file   Years of education: 12+    Highest education level: Not on file  Occupational History   Not on file  Tobacco Use   Smoking status: Never   Smokeless tobacco: Never  Vaping Use   Vaping Use: Never used  Substance and Sexual Activity   Alcohol use: No   Drug use: No   Sexual activity: Never  Other Topics Concern   Not on file  Social History Narrative   Right handed   Caffeine use: none   Lives with husband   Recently moved from [place] Determinants of Health   Financial Resource Strain: Not on file  Food Insecurity: Not on file  Transportation Needs: Not on file  Physical Activity: Not on file  Stress: Not on file  Social Connections: Not on file  Intimate Partner Violence: Not on file    Family History:    Family History  Problem Relation Age of Onset   Stroke Father    Stroke Brother    Cancer Other    Gout Mother    Hypertension Mother    Arthritis Mother    Asthma Mother    Stroke Brother      ROS:  Please see the history of present illness.   All other ROS reviewed and negative.     Physical Exam/Data:   Vitals:   06/25/22 0430 06/25/22 0545 06/25/22 0630 06/25/22 0930  BP: 136/69     Pulse: 80 76 75   Resp: 15 18 (!) 23   Temp:    (!) 97.4 F (36.3 C)  TempSrc:    Oral  SpO2: 97% 100% 97%    No intake or output data in the 24 hours ending 06/25/22 0944    06/11/2022   11:00 AM 06/26/2021    8:38 AM 04/24/2021    3:33 PM  Last 3 Weights  Weight (lbs) 283 lb 1.1 oz 310 lb 8 oz 314 lb  Weight (kg) 128.4 kg 140.842 kg 142.429 kg     There is no height or weight on file to calculate BMI.  General:  Well nourished, well developed, in no acute distress. Laying flat in the bed  HEENT: normal. No facial droop noted.  Neck: no JVD with head elevated to 30 degrees  Vascular: Radial pulses 2+ bilaterally  Cardiac:  normal S1, S2; RRR; no murmur. Distant heart sounds  Lungs:  Diminished breath sounds throughout. Normal WOB on room air  Abd: soft,  nontender Ext: no edema in bilateral lower extremities.  Musculoskeletal:  No deformities, BUE and BLE strength normal and equal Skin: warm and dry. Chronic skin thickening in BLE, venous stasis wounds present  Neuro:  CNs 2-12 intact, no focal abnormalities noted Psych:  Normal affect   EKG:  The EKG was personally reviewed and demonstrates:  normal sinus rhythm, inverted T waves in lead III (overall unchanged compared to previous EKG from 10/2021).  Telemetry:  Telemetry was personally reviewed and demonstrates:  Normal sinus rhythm, HR in the 50s-60s   Relevant CV Studies:   Laboratory Data:  High Sensitivity Troponin:   Recent Labs  Lab 06/25/22 0329 06/25/22 0627  TROPONINIHS 185* 197*     Chemistry Recent Labs  Lab 06/25/22 0329 06/25/22 0433  NA 137 140  K 3.4* 3.5  CL 107 108  CO2 20*  --   GLUCOSE 57* 52*  BUN 11 11  CREATININE 0.86 0.80  CALCIUM 9.5  --   MG 1.9  --   GFRNONAA >60  --   ANIONGAP 10  --     Recent Labs  Lab 06/25/22 0329  PROT 8.5*  ALBUMIN 2.9*  AST 15  ALT 14  ALKPHOS 109  BILITOT 0.5   Lipids No results for input(s): "CHOL", "TRIG", "HDL", "LABVLDL", "LDLCALC", "CHOLHDL" in the last 168 hours.  Hematology Recent Labs  Lab 06/25/22 0329 06/25/22 0433  WBC 9.5  --   RBC 4.54  --   HGB 12.0 13.3  HCT 39.4 39.0  MCV 86.8  --   MCH 26.4  --   MCHC 30.5  --   RDW 17.5*  --   PLT 464*  --    Thyroid No results for input(s): "TSH", "FREET4" in the last 168 hours.  BNPNo results for input(s): "BNP", "PROBNP" in the last 168 hours.  DDimer No results for input(s): "DDIMER" in the last 168 hours.   Radiology/Studies:  CT HEAD WO CONTRAST ( )  Result Date: 06/25/2022 CLINICAL DATA:  generalized weakness, recent stroke EXAM: CT HEAD WITHOUT CONTRAST TECHNIQUE: Contiguous axial images were obtained from the base of the skull through the vertex without intravenous contrast. RADIATION DOSE REDUCTION: This exam was performed  according to the departmental dose-optimization program which includes automated exposure control, adjustment of the mA and/or kV according to patient size and/or use of iterative reconstruction technique. COMPARISON:  06/11/2022 FINDINGS: Brain: Normal anatomic configuration. Parenchymal volume loss is commensurate with the patient's age. Mild periventricular white matter changes are present likely reflecting the sequela of small vessel ischemia. Previously noted region of hypodensity involving the right insular cortex and temporal operculum has largely resolved with a few small regions of cortical hypodensity within the insular cortex in keeping with subacute infarct. No abnormal intra or extra-axial mass lesion or fluid collection. No abnormal mass effect or midline shift. No evidence of acute intracranial hemorrhage or infarct. Ventricular size is normal. Cerebellum unremarkable. Vascular: No asymmetric hyperdense vasculature at the skull base. Skull: Intact Sinuses/Orbits: Paranasal sinuses are clear. Orbits are unremarkable. Other: Mastoid air cells and middle ear cavities are clear. IMPRESSION: 1. No acute intracranial hemorrhage or infarct. 2. 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 3. Mild senescent change.  Electronically Signed   By: Helyn Numbers M.D.   On: 06/25/2022 04:20   DG Chest 2 View  Result Date: 06/25/2022 CLINICAL DATA:  Generalized weakness. EXAM: CHEST - 2 VIEW COMPARISON:  Portable chest 06/21/2019, CTA chest 06/22/2019 preop FINDINGS: Limited exam due to low inspiratory effort and habitus. There is central bronchial thickening consistent with bronchitis and increased perihilar interstitial lung markings which may suggest viral bronchiolitis. Other interstitial pneumonitis is possible. Findings could also be due to low lung volumes. The peripheral lungs are clear. The sulci are sharp. Heart size and vasculature are normal with unremarkable  mediastinum. Thoracic spondylosis. Follow-up study is recommended in full inspiration. IMPRESSION: There is central bronchial thickening consistent with bronchitis and increased perihilar interstitial lung markings which may suggest viral bronchiolitis. Other interstitial pneumonitis is possible. Findings could also be due to low lung volumes. A follow-up study is recommended with PA and lateral views in full inspiration. Electronically Signed   By: Almira Bar M.D.   On: 06/25/2022 03:59     Assessment and Plan:   Elevated Troponin  Weakness  - Patient unsure why she is in the ER, denies having any symptoms. Husband reports that she had left leg weakness yesterday. Patient currently without weakness. Denies chest pain, sob, palpitations, syncope/near syncope  - hsTn 185>197  - Etiology of trop elevation unclear. However, with a flat trend and absence of chest pain, I have a low suspicion for ACS  - Echo pending. Further recommendations pending echocardiogram   History of CVA  - Patient was recently admitted from 06/11/22-06/16/22 for CVA. Was discharged on ASA, plavix  - Stressed the importance of DAPT  - Neurology recommended loop recorder at discharge, but patient refused at that time. Now, patient is willing to have loop recorder placed. Will reach out to EP to discuss further   Risk Assessment/Risk Scores:    For questions or updates, please contact [facility] Please consult [email or web address] for contact info under    Signed, Jonita Albee, PA-C  06/25/2022 9:44 AM  Patient examined chart reviewed Exam with obese black female distant heart sounds clear lungs anteriorly no abdomen tenderness Chronic LE lymphedema with ulcerations on both legs. No chest pain no acute ECG changes mild elevation in troponin does not appear to be clinically relevant.  Will update TTE and compare to recent to make sure EF still normal with no RWMA or signs of PE or effusion.  Discussed issues  with event monitor vs ILR from recent TIA she had. She is willing have ILR and will have EP see while she is here to consider implant She never received monitor at home and with body habitus 30 day wear time would be difficult Dr Graciela Husbands prior aware of ILR need   Charlton Haws MD [facility]

## 2022-06-25 NOTE — H&P (Signed)
History and Physical    Patient: [name] ZDG:[id number] DOB: 1951-03-31 DOA: 06/25/2022 DOS: the patient was seen and examined on 06/25/2022 PCP: Hilton Cork, PA-C  Patient coming from: Home via EMS  Chief Complaint:  Chief Complaint  Patient presents with   Extremity Weakness   HPI: [name] is a 72 y.o. female with medical history significant of HTN, HLD, DM type II, migraine headaches, asthma, bipolar, PVD who presents with complaints of weakness.  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  Symptoms were thought to be secondary to large vessel disease versus cardioembolic.  Patient was supposed to be set up with a holter monitor, but that had not been done.  After getting home patient felt like the symptoms of stroke have been improving.  She reports getting around with use of her power chair able to transfer without need of assistance, but states she had not been set up with rehab.  Wound care had not been coming out as previously promised to change her dressings for her lower extremities.  Last visit was on 1/3 and then missed 1/5.  Yesterday, evening she had been talking to her son when her speech was noted to be slowed and she felt weak all over.  Patient reported that she felt more on her left side than on the right.  She denies having any recent fever, chills, headache, chest pain, abdominal pain, dysuria, or shortness of breath.    Over the phone her husband adds that the patient was not able to move her left leg and was complaining of increased pain for which he became concerned and called EMS.  In the emergency department patient was noted to be afebrile with mild tachycardia, tachypnea, blood pressures 136/69-163/92, and O2 saturations maintained on room air.  CT scan  of the brain showed an evolving subacute infarct involving the right insular cortex and temporal operculum.  Labs significant for platelet count 464, potassium 3.4, glucose 57, and high-sensitivity troponin 185->197.  Cardiology had been formally consulted.  Blood cultures were ordered.  Chest x-ray had noted central bronchial thickening consistent with bronchitis and increased perihilar interstitial lung markings which may suggest viral bronchiolitis.  Influenza, COVID-19, and RSV screenings were negative.  Patient had been given full dose aspirin and potassium chloride 40 mEq.  At this time patient reports weakness has resolved.    Review of Systems: As mentioned in the history of present illness. All other systems reviewed and are negative. Past Medical History:  Diagnosis Date   Anxiety    Arthritis    "back, arms, legs" (03/24/2016)   Asthma    Chronic lower back pain    Colonic polyp    last colonoscopy done in 2009 with normal results per medical record   Depressive disorder    Gastric polyp    Gout    has taken allopurinol 300mg  once daily in past   Headache    History of hiatal hernia    Hypertension    Migraine    "none in awhile; might have a couple/year" (03/24/2016)   Mixed hyperlipidemia    01/2016 Total chol 141, HDL 59, LDL 63, ration 1.1   Osteoarthritis    TIA (transient ischemic attack) 11/2014   Type II diabetes mellitus (HCC)    Past Surgical History:  Procedure Laterality  Date   ARTERY BIOPSY Right 08/12/2016   Procedure: BIOPSY TEMPORAL ARTERY;  Surgeon: [name], MD;  Location: [facility];  Service: Vascular;  Laterality: Right;  BIOPSY TEMPORAL ARTERY   BREAST BIOPSY Left ~ 2015   benign   CARPAL TUNNEL RELEASE Bilateral    DILATION AND CURETTAGE OF UTERUS     IR CT HEAD LTD  06/11/2022   IR PERCUTANEOUS ART THROMBECTOMY/INFUSION INTRACRANIAL INC DIAG ANGIO  06/11/2022   IR US GUIDE VASC ACCESS RIGHT  06/11/2022   KNEE ARTHROSCOPY Right 2003   in [place] N/A 06/11/2022   Procedure: IR WITH ANESTHESIA;  Surgeon: Radiologist, Medication, MD;  Location: [facility];  Service: Radiology;  Laterality: N/A;   TUBAL LIGATION     VAGINAL HYSTERECTOMY  1982   Social History:  reports that she has never smoked. She has never used smokeless tobacco. She reports that she does not drink alcohol and does not use drugs.  Allergies  Allergen Reactions   Ace Inhibitors Swelling   Latex Itching and Swelling   Tizanidine     unk   Tramadol Other (See Comments)    UNK reaction   Metformin And Related Other (See Comments)    CHILLS   Propofol Itching    Family History  Problem Relation Age of Onset   Stroke Father    Stroke Brother    Cancer Other    Gout Mother    Hypertension Mother    Arthritis Mother    Asthma Mother    Stroke Brother     Prior to Admission medications   Medication Sig Start Date End Date Taking? Authorizing Provider  acetaminophen (TYLENOL) 500 MG tablet Take 500 mg by mouth daily as needed for moderate pain.    [provider]  allopurinol (ZYLOPRIM) 300 MG tablet Take 300 mg by mouth daily.    [provider]  aspirin 81 MG chewable tablet Chew 1 tablet (81 mg total) by mouth daily. 06/17/22   Otelia Santee, NP  atenolol (TENORMIN) 25 MG tablet Take 25-50 mg by mouth See admin instructions. Take 50 mg every morning and then 25 mg every evening    [provider]  clopidogrel (PLAVIX) 75 MG tablet Take 1 tablet (75 mg total) by mouth daily. 06/17/22   Otelia Santee, NP  clotrimazole (LOTRIMIN) 1 % cream Apply 1 Application topically 2 (two) times daily as needed (rash). 03/18/22   [provider]  cyclobenzaprine (FLEXERIL) 5 MG tablet Take 5 mg by mouth 3 (three) times daily as needed for muscle spasms. 06/03/22   [provider]  donepezil (ARICEPT) 10 MG tablet Take 10 mg by mouth at bedtime.    [provider]   furosemide (LASIX) 20 MG tablet Take 1 tablet (20 mg total) by mouth daily. Patient taking differently: Take 40 mg by mouth daily. 07/07/16   Thurnell Lose, MD  gabapentin (NEURONTIN) 300 MG capsule Take 2 capsules (600 mg total) by mouth 2 (two) times daily. Patient taking differently: Take 300-600 mg by mouth 2 (two) times daily. 02/08/21   Sharion Settler, DO  glipiZIDE (GLUCOTROL) 10 MG tablet Take 10 mg by mouth 2 (two) times daily before a meal.    [provider]  hydrocerin (EUCERIN) CREA Apply 1 application topically daily. Patient taking differently: Apply 1 application  topically daily as needed (dry skin). 01/15/16   Thurnell Lose,  MD  insulin glargine (LANTUS) 100 UNIT/ML Solostar Pen Inject 70 Units into the skin every morning. And pen needles 1/day 05/21/21   Renato Shin, MD  lurasidone (LATUDA) 20 MG TABS tablet Take 1 tablet (20 mg total) by mouth daily with breakfast for 14 days. Patient not taking: Reported on 06/12/2022 06/02/22 06/16/22  Freida Busman, MD  lurasidone (LATUDA) 40 MG TABS tablet Take 1 tablet (40 mg total) by mouth daily with breakfast. 06/16/22   Freida Busman, MD  polyethylene glycol (MIRALAX / GLYCOLAX) packet Take 17 g by mouth daily as needed for moderate constipation.    [provider]  traZODone (DESYREL) 50 MG tablet Take 1 tablet (50 mg total) by mouth at bedtime and may repeat dose one time if needed. Patient taking differently: Take 50 mg by mouth at bedtime as needed for sleep. 06/02/22   Freida Busman, MD    Physical Exam: Vitals:   06/25/22 0303 06/25/22 0430 06/25/22 0545 06/25/22 0630  BP: (!) 163/92 136/69    Pulse: (!) 109 80 76 75  Resp: 16 15 18  (!) 23  Temp: 98.2 F (36.8 C)     TempSrc: Oral     SpO2: 97% 97% 100% 97%   Constitutional: Morbidly obese female currently in no acute distress Eyes: PERRL, lids and conjunctivae normal ENMT: Mucous membranes are moist  neck: normal, supple,    Respiratory: clear to auscultation bilaterally, no wheezing, no crackles. Normal respiratory effort.  Cardiovascular: Regular rate and rhythm, no murmurs / rubs / gallops. No extremity edema. 2+ pedal pulses. No carotid bruits.  Abdomen: no tenderness, no masses palpated.   Bowel sounds positive.  Cloudy urine present in the canister by bedside. Musculoskeletal: no clubbing / cyanosis. No joint deformity upper and lower extremities. Good ROM, no contractures. Normal muscle tone.  Skin: Ulcerations noted on the bilateral lower extremities without significant signs of erythema. Neurologic: CN 2-12 grossly intact. Sensation intact, DTR normal. Strength 5/5 in all 4.  Psychiatric: Mild recent memory impairment. Alert to person and place.  Data Reviewed:  Reviewed labs, imaging and pertinent records as noted above in HPI. Assessment and Plan: Weakness Patient presented with complaints of weakness left side worse than the right and slowed speech.  CT scan of the brain showed subacute stroke in the area of the prior stroke.  Suspect symptoms are an exacerbation of recent stroke secondary to low blood sugar or possible UTI.   -Admit to a telemetry bed -Neurochecks -PT/OT to evaluate and treat  Elevated troponin Acute.   High-sensitivity troponin 185->197.  Patient hadbeen given full dose aspirin.  She denies any complaints of chest pain. -Check echocardiogram -Continue aspirin and Plavix -Cardiology consulted, will follow-up for any further recommendations  Uncontrolled diabetes mellitus type 2, with hypoglycemia Initial glucose as low as 52.  Last hemoglobin A1C was 9.5 on 06/11/2022. Home medication regimen includes Lantus 100 units units daily (previously had been 70 units daily) and glipizide 10 mg twice daily. -Hypoglycemic protocol -Hold glipizide -Adjusted pharmacy substitution of Semglee to 30 units daily starting tomorrow as long as blood sugars maintain -CBGs before every meal with  moderate SSI -NovoLog 0 to 5 units nightly -Adjust regimen as needed  Abnormal urinalysis Acute.  Patient was noted to have positive nitrites with many bacteria on urinalysis but no significant leukocytes or WBCs appreciated.  This could be a cause of patient's worsening weakness although denies any urinary complaints at this time. -Check urine culture -Start  Rocephin IV  Hypokalemia Acute.  Potassium 3.4.  Patient has been given potassium chloride p.o. per day. -Continue to monitor and replace as needed  History of CVA Patient just recently had right MCA stroke with right M2 occlusion status post TICI.  Patient was supposed to be set up with a Zio patch, but this had not been completed yet.   -Continue aspirin and Plavix -Cardiology to discuss with EP for possible placement of loop recorder  Essential hypertension Blood pressures initially elevated up to 163/92. -Continue current regimen as tolerated  Chronic bilateral lower extremity wounds Patient has chronic wounds of the lower extremities.  She reports she exposed daily -Continue prior wound care  Memory loss Patient's husband notes that she has continued to have issues with her short-term memory -Continue Aricept  Gout -Continue allopurinol    DVT prophylaxis: Lovenox Advance Care Planning:   Code Status: Full Code   Consults: Cardiology  Family Communication: Patient's husband updated over the phone  Severity of Illness: The appropriate patient status for this patient is OBSERVATION. Observation status is judged to be reasonable and necessary in order to provide the required intensity of service to ensure the patient's safety. The patient's presenting symptoms, physical exam findings, and initial radiographic and laboratory data in the context of their medical condition is felt to place them at decreased risk for further clinical deterioration. Furthermore, it is anticipated that the patient will be medically  stable for discharge from the hospital within 2 midnights of admission.   Author: Clydie Braun, MD 06/25/2022 7:43 AM  For on call review [email or web address].

## 2022-06-25 NOTE — ED Provider Triage Note (Signed)
Emergency Medicine Provider Triage Evaluation Note  [name] , a 72 y.o. female  was evaluated in triage.  Pt complains of sudden onset of generalized weakness just prior to arrival in the ER.  She denies any vomiting or diarrhea she denies any chest pain difficulty breathing.  She denies any vertigo  She was just recently discharged in the hospital after a ischemic stroke her discharge date was 1/1. She does not seem to recall that this was a recent admission.  She thought this was much longer ago.  She states her weakness is affecting all of her extremities equally  Review of Systems  Positive: Weakness Negative: Fever  Physical Exam  BP (!) 163/92 (BP Location: Right Wrist)   Pulse (!) 109   Temp 98.2 F (36.8 C) (Oral)   Resp 16   SpO2 97%  Gen:   Awake, no distress   Resp:  Normal effort  MSK:   Moves extremities without difficulty  Other:  Left grip weaker than right, questionable left facial droop, left leg seems to be slightly weaker than right.  Wounds to bilateral lower extremities and swelling.  Medical Decision Making  Medically screening exam initiated at 3:36 AM.  Appropriate orders placed.  [name] was informed that the remainder of the evaluation will be completed by another provider, this initial triage assessment does not replace that evaluation, and the importance of remaining in the ED until their evaluation is complete.  Attempted to obtain collateral for additional information.  She tells me that her left arm is always weak but she seems to be a poor historian.  She will be moved immediately to major care for further evaluation and care.   Pati Gallo [place], [place] 06/25/22 [phone]

## 2022-06-25 NOTE — ED Notes (Signed)
Transit called for pt transport.

## 2022-06-25 NOTE — ED Triage Notes (Addendum)
Pt bib [facility] from home for bilateral leg pain and weakness worsening tonight. Pt with wounds on both legs that is being treated by wound care nurse - last visit 1/3 (missed visit on 1/5). Negative stroke screen w/ems. VSS w/ems.

## 2022-06-25 NOTE — ED Notes (Signed)
Pt going to xray and CT 

## 2022-06-25 NOTE — Progress Notes (Signed)
  Echocardiogram 2D Echocardiogram has been performed.  [name] 06/25/2022, 3:05 PM

## 2022-06-25 NOTE — ED Triage Notes (Addendum)
Pt states that all over weakness started tonight. From previous strokes left sided weaker than others. PT denies pain. No swallowing difficulty. Pt does have questionable left facial droop.

## 2022-06-26 ENCOUNTER — Ambulatory Visit (HOSPITAL_COMMUNITY): Admit: 2022-06-26 | Payer: Medicare PPO | Admitting: Cardiology

## 2022-06-26 ENCOUNTER — Encounter (HOSPITAL_COMMUNITY)
Admission: EM | Disposition: A | Discharge status: Home Health | Payer: Self-pay | Source: Home / Self Care | Attending: Emergency Medicine

## 2022-06-26 DIAGNOSIS — R8290 Unspecified abnormal findings in urine: Secondary | ICD-10-CM | POA: Diagnosis not present

## 2022-06-26 DIAGNOSIS — R8271 Bacteriuria: Secondary | ICD-10-CM

## 2022-06-26 DIAGNOSIS — I83019 Varicose veins of right lower extremity with ulcer of unspecified site: Secondary | ICD-10-CM

## 2022-06-26 DIAGNOSIS — I639 Cerebral infarction, unspecified: Secondary | ICD-10-CM

## 2022-06-26 DIAGNOSIS — R7989 Other specified abnormal findings of blood chemistry: Secondary | ICD-10-CM | POA: Diagnosis not present

## 2022-06-26 DIAGNOSIS — L97919 Non-pressure chronic ulcer of unspecified part of right lower leg with unspecified severity: Secondary | ICD-10-CM

## 2022-06-26 DIAGNOSIS — R531 Weakness: Secondary | ICD-10-CM | POA: Diagnosis not present

## 2022-06-26 DIAGNOSIS — I83029 Varicose veins of left lower extremity with ulcer of unspecified site: Secondary | ICD-10-CM

## 2022-06-26 DIAGNOSIS — L97929 Non-pressure chronic ulcer of unspecified part of left lower leg with unspecified severity: Secondary | ICD-10-CM

## 2022-06-26 DIAGNOSIS — I83009 Varicose veins of unspecified lower extremity with ulcer of unspecified site: Secondary | ICD-10-CM | POA: Insufficient documentation

## 2022-06-26 DIAGNOSIS — E11649 Type 2 diabetes mellitus with hypoglycemia without coma: Secondary | ICD-10-CM | POA: Diagnosis not present

## 2022-06-26 HISTORY — PX: LOOP RECORDER INSERTION: EP1214

## 2022-06-26 LAB — LACTIC ACID, PLASMA: Lactic Acid, Venous: 1.3 mmol/L (ref 0.5–1.9)

## 2022-06-26 LAB — CBC
HCT: 34.4 % — ABNORMAL LOW (ref 36.0–46.0)
Hemoglobin: 10.4 g/dL — ABNORMAL LOW (ref 12.0–15.0)
MCH: 26.3 pg (ref 26.0–34.0)
MCHC: 30.2 g/dL (ref 30.0–36.0)
MCV: 86.9 fL (ref 80.0–100.0)
Platelets: 408 10*3/uL — ABNORMAL HIGH (ref 150–400)
RBC: 3.96 MIL/uL (ref 3.87–5.11)
RDW: 17.6 % — ABNORMAL HIGH (ref 11.5–15.5)
WBC: 6.4 10*3/uL (ref 4.0–10.5)
nRBC: 0 % (ref 0.0–0.2)

## 2022-06-26 LAB — BASIC METABOLIC PANEL
Anion gap: 7 (ref 5–15)
BUN: 11 mg/dL (ref 8–23)
CO2: 22 mmol/L (ref 22–32)
Calcium: 9.1 mg/dL (ref 8.9–10.3)
Chloride: 109 mmol/L (ref 98–111)
Creatinine, Ser: 0.95 mg/dL (ref 0.44–1.00)
GFR, Estimated: >60 mL/min (ref >60)
Glucose, Bld: 123 mg/dL — ABNORMAL HIGH (ref 70–99)
Potassium: 3.8 mmol/L (ref 3.5–5.1)
Sodium: 138 mmol/L (ref 135–145)

## 2022-06-26 LAB — GLUCOSE, CAPILLARY
Glucose-Capillary: 143 mg/dL — ABNORMAL HIGH (ref 70–99)
Glucose-Capillary: 116 mg/dL — ABNORMAL HIGH (ref 70–99)
Glucose-Capillary: 262 mg/dL — ABNORMAL HIGH (ref 70–99)

## 2022-06-26 LAB — URINE CULTURE

## 2022-06-26 SURGERY — LOOP RECORDER INSERTION
Anesthesia: LOCAL

## 2022-06-26 MED ORDER — ENOXAPARIN SODIUM 60 MG/0.6ML IJ SOSY
60.0000 mg | PREFILLED_SYRINGE | INTRAMUSCULAR | Status: DC
  Administered 2022-06-27 – 2022-06-28 (×2): 60 mg via SUBCUTANEOUS
  Filled 2022-06-26 (×2): qty 0.6

## 2022-06-26 MED ORDER — LIDOCAINE-EPINEPHRINE 1 %-1:100000 IJ SOLN
INTRAMUSCULAR | Status: DC | PRN
  Administered 2022-06-26: 10 mL

## 2022-06-26 MED ORDER — MEDIHONEY WOUND/BURN DRESSING EX PSTE
1.0000 | PASTE | Freq: Every day | CUTANEOUS | Status: DC
  Administered 2022-06-26 – 2022-06-27 (×2): 1 via TOPICAL
  Filled 2022-06-26: qty 44

## 2022-06-26 MED ORDER — LIDOCAINE-EPINEPHRINE 1 %-1:100000 IJ SOLN
INTRAMUSCULAR | Status: AC
  Filled 2022-06-26: qty 1

## 2022-06-26 MED ORDER — ATENOLOL 25 MG PO TABS
25.0000 mg | ORAL_TABLET | Freq: Every day | ORAL | Status: DC
  Administered 2022-06-26 – 2022-06-27 (×2): 25 mg via ORAL
  Filled 2022-06-26 (×3): qty 1

## 2022-06-26 MED ORDER — ORAL CARE MOUTH RINSE: 15.0000 mL | OROMUCOSAL | Status: DC | PRN

## 2022-06-26 SURGICAL SUPPLY — 2 items
MONITOR CARDIAC ASSERT IQ EL (Prosthesis & Implant Heart) IMPLANT
PACK LOOP INSERTION (CUSTOM PROCEDURE TRAY) ×1 IMPLANT

## 2022-06-26 NOTE — Discharge Instructions (Addendum)
Post procedure wound care instructions (heart monitor) Keep incision clean and dry for 3 days. You can remove outer plastic dressing tomorrow. Leave steri-strips (little pieces of tape) on until seen in the office for wound check appointment. Call the office [phone]) for redness, drainage, swelling, or fever.   ** Please take aspirin 81mg  daily until told otherwise to prevent another stroke**

## 2022-06-26 NOTE — Progress Notes (Signed)
PROGRESS NOTE  [name] [id number] DOB: 07-Jun-1951   PCP: Sherin Quarry, PA-C  Patient is from: Home.  Lives with husband.  Uses electric scooter for mobility.  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  Patient returns to the hospital with generalized weakness.  Also concerned about therapy and wound care staff not visiting lately.  Patient was discharged with a plan to have heart monitor.  On arrival to ED, vital stable except for mild tachycardia.  On room air.  Glucose 52.  CT head showed evolving subacute right insular cortex and temporal operculum infarct.  Troponin 185> 195.  CXR suggested bronchitis/possible bronchiolitis.  Influenza, COVID-19 and RSV PCR nonreactive.  UA with nitrite and many bacteria.  Patient was given full dose aspirin.  Cardiology consulted.  Started on IV ceftriaxone for possible UTI.   Patient back to baseline.  Cardiology planning loop recorder implantation.  Subjective: Seen and examined earlier this morning.  Feels much better today.  Feels back to her baseline.  She has stopped her left-sided weakness from previous stroke.  Denies chest pain, shortness of breath, cough, nausea, vomiting, abdominal pain, diarrhea or UTI symptoms.  Objective: Vitals:   06/26/22 0100 06/26/22 0404 06/26/22 0818 06/26/22 1156  BP: (!) 141/62 139/63 139/65 (!) 160/76  Pulse: 69 61 (!) 59 (!) 56  Resp: (!) 23 (!) 23 20 20   Temp: (!) 97.1 F (36.2 C) (!) 97.2 F (36.2 C) 97.9 F (36.6 C) 97.6 F (36.4 C)  TempSrc: Oral Oral Oral Oral  SpO2: 97% 97% 100% 100%  Weight: 128.6 kg        Examination:  GENERAL: No apparent distress.  Nontoxic. HEENT: MMM.  Vision and hearing grossly intact.  NECK: Supple.  No apparent JVD.  RESP:  No IWOB.  Fair aeration bilaterally. CVS:  RRR. Heart sounds normal.  ABD/GI/GU: BS+. Abd soft, NTND.  MSK/EXT:  Moves extremities.  Slightly weaker in left.  SKIN: Chronic BLE venous stasis ulcer.  See picture under media NEURO: Awake, alert and oriented x 4 except date.  Motor 3/5 in LLE, 4/5 in RLE, 5/5 in RUE and 4+/5 in LUE.  Speech clear.  No facial asymmetry. PSYCH: Calm. Normal affect.   Procedures:  None  Microbiology summarized: [id number], influenza and RSV PCR nonreactive. 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  BMP glucose was 52 on arrival.  Patient is on insulin and glipizide which increases her risk of hypoglycemia.  CT head suggested subacute right-sided MCA.  UA with nitrite and many bacteria but patient denies UTI symptoms.  She is currently back to baseline. 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  CT head consistent with a recent CVA.  No acute neuro finding other than residual left hemiparesis.  Patient does support to be set up with a Zio patch that has now completed. -Plan for loop recorder implantation by cardiology  -Continue aspirin and Plavix for a total of 3 weeks followed by aspirin alone as previously planned -Not on statin with LDL of 45.   Elevated troponin: Likely demand ischemia.   No significant delta.  Patient without chest pain.  TTE without significant finding.  Already on DAPT after recent CVA. -Continue DAPT -Cardiology signed off.   Uncontrolled IDDM-2 with hyperglycemia: A1c 9.5% on 06/11/2022.  Initial glucose 52.  Patient is on glipizide and insulin which could increase the risk of hypoglycemia. Recent Labs  Lab 06/25/22 0618 06/25/22 1655 06/25/22 2036 06/26/22 0924 06/26/22 1229  GLUCAP 115* 103* 210* 143* 116*  -Recommend discontinuing glipizide on discharge. -Continue current insulin regimen -Not on statin.  LDL was 45.   Bacteriuria: Positive for nitrite and bacteria.  Patient denies UTI symptoms.  She has no fever or leukocytosis.  Started on IV ceftriaxone. -Will let her complete 3 days of antibiotic -Follow urine cultures   Hypokalemia: Resolved. Acute.  Potassium 3.4.  Patient has been given potassium chloride 56meq p.o. per day. -Continue to monitor and replace as needed   Essential hypertension normotensive for most part. -Continue home regimen-atenolol and nifedipine.   Chronic bilateral lower extremity wounds Patient has chronic wounds of the lower extremities.  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  Good insight.  "Issues with short-term memory per husband". -Continue Aricept  History of PVD/venous stasis ulcers: No signs of infection. -Continue wound care  History of bipolar disorder: Stable. -Continue home Latuda   Gout -Continue allopurinol  Morbid obesity Body mass index is 47.18 kg/m.           DVT prophylaxis:  enoxaparin (LOVENOX) injection 40 mg Start: 06/25/22 1000  Code Status: Full code Family Communication: None at bedside Level of care: Progressive Status is: Observation The patient remains OBS appropriate and will d/c before 2 midnights.   Final disposition: Home with home health Consultants:  Cardiology  35 minutes with more than  50% spent in reviewing records, counseling patient/family and coordinating care.   Sch Meds:  Scheduled Meds:  aspirin  81 mg Oral Daily   atenolol  25 mg Oral QHS   clopidogrel  75 mg Oral q AM   donepezil  10 mg Oral q AM   enoxaparin (LOVENOX) injection  40 mg Subcutaneous Q24H   gabapentin  300 mg Oral BID   insulin aspart  0-15 Units Subcutaneous TID WC   insulin aspart  0-5 Units Subcutaneous QHS   insulin glargine-yfgn  30 Units Subcutaneous Daily   leptospermum manuka honey  1 Application Topical Daily   lurasidone  40 mg Oral Q breakfast   NIFEdipine  90 mg Oral q AM   sodium chloride flush  3 mL Intravenous Q12H   traZODone  50 mg Oral QHS,MR X 1   Continuous Infusions:  cefTRIAXone (ROCEPHIN)  IV Stopped (06/25/22 1658)   PRN Meds:.acetaminophen **OR** acetaminophen, albuterol, cyclobenzaprine, ondansetron **OR** ondansetron (ZOFRAN) IV, mouth rinse  Antimicrobials: Anti-infectives (From admission, onward)    Start     Dose/Rate Route Frequency Ordered Stop   06/25/22 1500  cefTRIAXone (ROCEPHIN) 2 g in  sodium chloride 0.9 % 100 mL IVPB        2 g 200 mL/hr over 30 Minutes Intravenous Every 24 hours 06/25/22 1447          I have personally reviewed the following labs and images: CBC: Recent Labs  Lab 06/25/22 0329 06/25/22 0433 06/26/22 0043  WBC 9.5  --  6.4  HGB 12.0 13.3 10.4*  HCT 39.4 39.0 34.4*  MCV 86.8  --  86.9  PLT 464*  --  408*   BMP &GFR Recent Labs  Lab 06/25/22 0329 06/25/22 0433 06/26/22 0043  NA 137 140 138  K 3.4* 3.5 3.8  CL 107 108 109  CO2 20*  --  22  GLUCOSE 57* 52* 123*  BUN 11 11 11   CREATININE 0.86 0.80 0.95  CALCIUM 9.5  --  9.1  MG 1.9  --   --    Estimated Creatinine Clearance: 71.3 mL/min (by C-G formula based on SCr of 0.95 mg/dL). Liver & Pancreas: Recent Labs  Lab 06/25/22 0329  AST 15  ALT 14  ALKPHOS 109  BILITOT 0.5  PROT 8.5*  ALBUMIN 2.9*   No results for input(s): "LIPASE", "AMYLASE" in the  last 168 hours. No results for input(s): "AMMONIA" in the last 168 hours. Diabetic: No results for input(s): "HGBA1C" in the last 72 hours. Recent Labs  Lab 06/25/22 0618 06/25/22 1655 06/25/22 2036 06/26/22 0924 06/26/22 1229  GLUCAP 115* 103* 210* 143* 116*   Cardiac Enzymes: No results for input(s): "CKTOTAL", "CKMB", "CKMBINDEX", "TROPONINI" in the last 168 hours. No results for input(s): "PROBNP" in the last 8760 hours. Coagulation Profile: No results for input(s): "INR", "PROTIME" in the last 168 hours. Thyroid Function Tests: No results for input(s): "TSH", "T4TOTAL", "FREET4", "T3FREE", "THYROIDAB" in the last 72 hours. Lipid Profile: No results for input(s): "CHOL", "HDL", "LDLCALC", "TRIG", "CHOLHDL", "LDLDIRECT" in the last 72 hours. Anemia Panel: No results for input(s): "VITAMINB12", "FOLATE", "FERRITIN", "TIBC", "IRON", "RETICCTPCT" in the last 72 hours. Urine analysis:    Component Value Date/Time   COLORURINE YELLOW 06/25/2022 1230   APPEARANCEUR CLEAR 06/25/2022 1230   LABSPEC 1.005 06/25/2022 1230   PHURINE 7.0 06/25/2022 1230   GLUCOSEU NEGATIVE 06/25/2022 1230   HGBUR NEGATIVE 06/25/2022 [place] 06/25/2022 1230   KETONESUR NEGATIVE 06/25/2022 1230   PROTEINUR NEGATIVE 06/25/2022 1230   NITRITE POSITIVE (A) 06/25/2022 1230   LEUKOCYTESUR NEGATIVE 06/25/2022 1230   Sepsis Labs: Invalid input(s): "PROCALCITONIN", "LACTICIDVEN"  Microbiology: Recent Results (from the past 240 hour(s))  Blood culture (routine x 2)     Status: None (Preliminary result)   Collection Time: 06/25/22  3:29 AM   Specimen: BLOOD  Result Value Ref Range Status   Specimen Description BLOOD RIGHT ANTECUBITAL  Final   Special Requests AEROBIC BOTTLE ONLY Blood Culture adequate volume  Final   Culture   Final    NO GROWTH 1 DAY Performed at [facility], [place] [address] [place], [place] [address]    Report Status PENDING  Incomplete  Resp panel by  RT-PCR (RSV, Flu A&B, Covid) Anterior Nasal Swab     Status: None   Collection Time: 06/25/22  4:29 AM   Specimen: Anterior Nasal Swab  Result Value Ref Range Status   SARS Coronavirus 2 by RT PCR NEGATIVE NEGATIVE Final    Comment: (NOTE) SARS-CoV-2 target nucleic acids are NOT DETECTED.  The SARS-CoV-2 RNA is generally detectable in upper respiratory specimens during the acute  phase of infection. The lowest concentration of SARS-CoV-2 viral copies this assay can detect is 138 copies/mL. A negative result does not preclude SARS-Cov-2 infection and should not be used as the sole basis for treatment or other patient management decisions. A negative result may occur with  improper specimen collection/handling, submission of specimen other than nasopharyngeal swab, presence of viral mutation(s) within the areas targeted by this assay, and inadequate number of viral copies(<138 copies/mL). A negative result must be combined with clinical observations, patient history, and epidemiological information. The expected result is Negative.  Fact Sheet for Patients:  BloggerCourse.com  Fact Sheet for Healthcare Providers:  SeriousBroker.it  This test is no t yet approved or cleared by the [place] FDA and  has been authorized for detection and/or diagnosis of SARS-CoV-2 by FDA under an Emergency Use Authorization (EUA). This EUA will remain  in effect (meaning this test can be used) for the duration of the COVID-19 declaration under Section 564(b)(1) of the Act, 21 U.S.C.section 360bbb-3(b)(1), unless the authorization is terminated  or revoked sooner.       Influenza A by PCR NEGATIVE NEGATIVE Final   Influenza B by PCR NEGATIVE NEGATIVE Final    Comment: (NOTE) The Xpert Xpress SARS-CoV-2/FLU/RSV plus assay is intended as an aid in the diagnosis of influenza from Nasopharyngeal swab specimens and should not be used as a sole basis  for treatment. Nasal washings and aspirates are unacceptable for Xpert Xpress SARS-CoV-2/FLU/RSV testing.  Fact Sheet for Patients: BloggerCourse.com  Fact Sheet for Healthcare Providers: SeriousBroker.it  This test is not yet approved or cleared by the [place] FDA and has been authorized for detection and/or diagnosis of SARS-CoV-2 by FDA under an Emergency Use Authorization (EUA). This EUA will remain in effect (meaning this test can be used) for the duration of the COVID-19 declaration under Section 564(b)(1) of the Act, 21 U.S.C. section 360bbb-3(b)(1), unless the authorization is terminated or revoked.     Resp Syncytial Virus by PCR NEGATIVE NEGATIVE Final    Comment: (NOTE) Fact Sheet for Patients: BloggerCourse.com  Fact Sheet for Healthcare Providers: SeriousBroker.it  This test is not yet approved or cleared by the [place] FDA and has been authorized for detection and/or diagnosis of SARS-CoV-2 by FDA under an Emergency Use Authorization (EUA). This EUA will remain in effect (meaning this test can be used) for the duration of the COVID-19 declaration under Section 564(b)(1) of the Act, 21 U.S.C. section 360bbb-3(b)(1), unless the authorization is terminated or revoked.  Performed at [facility], [address], [place], [place] [address]   Blood culture (routine x 2)     Status: None (Preliminary result)   Collection Time: 06/25/22  6:27 AM   Specimen: BLOOD LEFT FOREARM  Result Value Ref Range Status   Specimen Description BLOOD LEFT FOREARM  Final   Special Requests   Final    BOTTLES DRAWN AEROBIC AND ANAEROBIC Blood Culture results may not be optimal due to an inadequate volume of blood received in culture bottles   Culture   Final    NO GROWTH 1 DAY Performed at [facility], [address], [place], [place] [address]    Report  Status PENDING  Incomplete  Urine Culture     Status: Abnormal   Collection Time: 06/25/22 12:30 PM   Specimen: Urine, Clean Catch  Result Value Ref Range Status   Specimen Description URINE, CLEAN CATCH  Final   Special Requests   Final  NONE Performed at [facility], [place] [address] [place], [place] [address]    Culture MULTIPLE SPECIES PRESENT, SUGGEST RECOLLECTION (A)  Final   Report Status 06/26/2022 FINAL  Final    Radiology Studies: ECHOCARDIOGRAM COMPLETE  Result Date: 06/25/2022    ECHOCARDIOGRAM REPORT   Patient Name:   TOMICO GANNAWAY Date of Exam: 06/25/2022 Medical Rec #:  [id number]       Height:       65.0 in Accession #:    [id number]      Weight:       283.1 lb Date of Birth:  05-15-1951      BSA:          2.293 m Patient Age:    50 years        BP:           117/104 mmHg Patient Gender: F               HR:           68 bpm. Exam Location:  Inpatient Procedure: 2D Echo, Cardiac Doppler and Color Doppler Indications:    Elevated Troponin  History:        Patient has prior history of Echocardiogram examinations, most                 recent 06/11/2022. TIA and Stroke; Risk Factors:Dyslipidemia,                 Hypertension and Diabetes.  Sonographer:    Ronny Flurry Referring Phys: [id number] RONDELL A SMITH IMPRESSIONS  1. Left ventricular ejection fraction, by estimation, is 65 to 70%. The left ventricle has normal function. The left ventricle has no regional wall motion abnormalities. There is moderate left ventricular hypertrophy. Left ventricular diastolic parameters are consistent with Grade I diastolic dysfunction (impaired relaxation).  2. Right ventricular systolic function is normal. The right ventricular size is normal. There is normal pulmonary artery systolic pressure. The estimated right ventricular systolic pressure is Q000111Q mmHg.  3. The mitral valve is normal in structure. Trivial mitral valve regurgitation. No evidence of mitral stenosis.  4. The aortic valve  was not well visualized. There is mild calcification of the aortic valve. Aortic valve regurgitation is not visualized. No aortic stenosis is present.  5. Increased flow velocities may be secondary to anemia, thyrotoxicosis, hyperdynamic or high flow state.  6. The inferior vena cava is normal in size with greater than 50% respiratory variability, suggesting right atrial pressure of 3 mmHg.  7. Cannot exclude a small PFO. FINDINGS  Left Ventricle: Left ventricular ejection fraction, by estimation, is 65 to 70%. The left ventricle has normal function. The left ventricle has no regional wall motion abnormalities. The left ventricular internal cavity size was normal in size. There is  moderate left ventricular hypertrophy. Left ventricular diastolic parameters are consistent with Grade I diastolic dysfunction (impaired relaxation). Right Ventricle: The right ventricular size is normal. No increase in right ventricular wall thickness. Right ventricular systolic function is normal. There is normal pulmonary artery systolic pressure. The tricuspid regurgitant velocity is 2.31 m/s, and  with an assumed right atrial pressure of 3 mmHg, the estimated right ventricular systolic pressure is Q000111Q mmHg. Left Atrium: Left atrial size was normal in size. Right Atrium: Right atrial size was normal in size. Pericardium: There is no evidence of pericardial effusion. Presence of epicardial fat layer. Mitral Valve: The mitral valve is normal in structure. Trivial mitral valve regurgitation.  No evidence of mitral valve stenosis. Tricuspid Valve: The tricuspid valve is normal in structure. Tricuspid valve regurgitation is trivial. No evidence of tricuspid stenosis. Aortic Valve: The aortic valve was not well visualized. There is mild calcification of the aortic valve. Aortic valve regurgitation is not visualized. No aortic stenosis is present. Aortic valve mean gradient measures 9.0 mmHg. Aortic valve peak gradient  measures 15.5 mmHg.  Aortic valve area, by VTI measures 2.46 cm. Pulmonic Valve: The pulmonic valve was normal in structure. Pulmonic valve regurgitation is trivial. No evidence of pulmonic stenosis. Aorta: The aortic root is normal in size and structure. Venous: The inferior vena cava is normal in size with greater than 50% respiratory variability, suggesting right atrial pressure of 3 mmHg. IAS/Shunts: Cannot exclude a small PFO.  LEFT VENTRICLE PLAX 2D LVIDd:         4.20 cm   Diastology LVIDs:         3.10 cm   LV e' medial:    5.98 cm/s LV PW:         0.90 cm   LV E/e' medial:  14.8 LV IVS:        1.30 cm   LV e' lateral:   6.96 cm/s LVOT diam:     2.20 cm   LV E/e' lateral: 12.8 LV SV:         97 LV SV Index:   42 LVOT Area:     3.80 cm  RIGHT VENTRICLE             IVC RV S prime:     22.00 cm/s  IVC diam: 1.30 cm TAPSE (M-mode): 2.4 cm LEFT ATRIUM             Index        RIGHT ATRIUM           Index LA diam:        4.10 cm 1.79 cm/m   RA Area:     12.50 cm LA Vol (A2C):   49.8 ml 21.72 ml/m  RA Volume:   28.10 ml  12.26 ml/m LA Vol (A4C):   47.6 ml 20.76 ml/m LA Biplane Vol: 51.7 ml 22.55 ml/m  AORTIC VALVE AV Area (Vmax):    2.42 cm AV Area (Vmean):   2.35 cm AV Area (VTI):     2.46 cm AV Vmax:           197.00 cm/s AV Vmean:          139.000 cm/s AV VTI:            0.394 m AV Peak Grad:      15.5 mmHg AV Mean Grad:      9.0 mmHg LVOT Vmax:         125.33 cm/s LVOT Vmean:        85.867 cm/s LVOT VTI:          0.255 m LVOT/AV VTI ratio: 0.65  AORTA Ao Root diam: 2.60 cm Ao Asc diam:  3.50 cm MITRAL VALVE                TRICUSPID VALVE MV Area (PHT): 3.03 cm     TR Peak grad:   21.3 mmHg MV Decel Time: 250 msec     TR Vmax:        231.00 cm/s MV E velocity: 88.80 cm/s MV A velocity: 132.00 cm/s  SHUNTS MV E/A ratio:  0.67  Systemic VTI:  0.26 m                             Systemic Diam: 2.20 cm Cherlynn Kaiser MD Electronically signed by Cherlynn Kaiser MD Signature Date/Time: 06/25/2022/8:03:45 PM    Final        Burtis Imhoff [address] [place] [address]  If 7PM-7AM, please contact night-coverage [email or web address] 06/26/2022, 12:50 PM

## 2022-06-26 NOTE — Evaluation (Signed)
Occupational Therapy Evaluation Patient Details Name: [name] MRN: [id number] DOB: 09/12/1950 Today's Date: 06/26/2022   History of Present Illness Pt is a 72 year old woman admitted on 1/10 with B LE pain and weakness. PMH: recent R MCA stroke with thrombectomy d/c home 1/1.  DM2, HLD, migraine, HTN, asthma, bipoar d/o, PVD, B LE wounds, anxiety and depression, gout, chronic back pain, obesity.   Clinical Impression   Pt is motorized w/c dependent and transfers at baseline. She reports independence in self care, sponge bathes. She is assisted for LE wound care. Pt lives with her supportive husband. Pt presents with residual L UE weakness and impaired standing balance. Pt currently requires supervision for OOB to chair with set up to simulate w/c at home. She needs assist for pericare and donning socks. Pt reports mobilizing near her baseline. Will follow acutely.      Recommendations for follow up therapy are one component of a multi-disciplinary discharge planning process, led by the attending physician.  Recommendations may be updated based on patient status, additional functional criteria and insurance authorization.   Follow Up Recommendations  Home health OT     Assistance Recommended at Discharge Intermittent Supervision/Assistance  Patient can return home with the following A little help with walking and/or transfers;A lot of help with bathing/dressing/bathroom;Assist for transportation    Functional Status Assessment  Patient has had a recent decline in their functional status and demonstrates the ability to make significant improvements in function in a reasonable and predictable amount of time.  Equipment Recommendations  None recommended by OT    Recommendations for Other Services       Precautions / Restrictions Precautions Precautions: Fall Restrictions Weight Bearing Restrictions: No      Mobility Bed Mobility Overal bed mobility: Modified Independent              General bed mobility comments: increased time, use of rail, HOB up 30 degrees    Transfers Overall transfer level: Needs assistance Equipment used: None Transfers: Bed to chair/wheelchair/BSC     Squat pivot transfers: Supervision       General transfer comment: pt verbalized how to set up chair simulating w/c at home      Balance Overall balance assessment: Needs assistance   Sitting balance-Leahy Scale: Good                                     ADL either performed or assessed with clinical judgement   ADL Overall ADL's : Needs assistance/impaired Eating/Feeding: Independent;Bed level   Grooming: Set up;Sitting   Upper Body Bathing: Set up;Sitting   Lower Body Bathing: Set up;Sitting/lateral leans   Upper Body Dressing : Set up;Sitting   Lower Body Dressing: Total assistance;Bed level Lower Body Dressing Details (indicate cue type and reason): socks Toilet Transfer: Supervision/safety;Squat-pivot   Toileting- Clothing Manipulation and Hygiene: Total assistance               Vision Ability to See in Adequate Light: 0 Adequate Patient Visual Report: No change from baseline       Perception     Praxis      Pertinent Vitals/Pain Pain Assessment Pain Assessment: No/denies pain     Hand Dominance Right   Extremity/Trunk Assessment Upper Extremity Assessment Upper Extremity Assessment: LUE deficits/detail (mild residual weakness)   Lower Extremity Assessment Lower Extremity Assessment: Defer to PT evaluation   Cervical /  Trunk Assessment Cervical / Trunk Assessment: Kyphotic;Other exceptions (obesity)   Communication Communication Communication: No difficulties   Cognition Arousal/Alertness: Awake/alert Behavior During Therapy: WFL for tasks assessed/performed Overall Cognitive Status: No family/caregiver present to determine baseline cognitive functioning Area of Impairment: Problem solving                              Problem Solving: Decreased initiation General Comments: pt in urine soaked bed, unaware, had not called for assistance     General Comments       Exercises     Shoulder Instructions      Home Living Family/patient expects to be discharged to:: Private residence Living Arrangements: Spouse/significant other Available Help at Discharge: Available 24 hours/day Type of Home: Apartment Home Access: Ramped entrance     Home Layout: One level     Bathroom Shower/Tub: Teacher, early years/pre: Standard   How Accessible: Accessible via wheelchair Home Equipment: Wheelchair - power;Wheelchair - manual;Transport chair;Hospital bed;Grab bars - toilet;Grab bars - tub/shower;Hand held Conservation officer, nature (2 wheels)   Additional Comments: transfers only; has not "walked in years";      Prior Functioning/Environment Prior Level of Function : Needs assist             Mobility Comments: transfers to and from w/c independently ADLs Comments: independent in self care, sponge bathes        OT Problem List: Decreased strength      OT Treatment/Interventions: Self-care/ADL training;Patient/family education;Therapeutic activities    OT Goals(Current goals can be found in the care plan section) Acute Rehab OT Goals OT Goal Formulation: With patient Time For Goal Achievement: 07/10/22 Potential to Achieve Goals: Good ADL Goals Pt Will Perform Lower Body Dressing: with set-up;sitting/lateral leans Pt Will Transfer to Toilet: with modified independence;stand pivot transfer;bedside commode Pt Will Perform Toileting - Clothing Manipulation and hygiene: with modified independence;sitting/lateral leans;sit to/from stand  OT Frequency: Min 2X/week    Co-evaluation              AM-PAC OT "6 Clicks" Daily Activity     Outcome Measure Help from another person eating meals?: None Help from another person taking care of personal  grooming?: A Little Help from another person toileting, which includes using toliet, bedpan, or urinal?: Total Help from another person bathing (including washing, rinsing, drying)?: A Little Help from another person to put on and taking off regular upper body clothing?: A Little Help from another person to put on and taking off regular lower body clothing?: A Little 6 Click Score: 17   End of Session    Activity Tolerance: Patient tolerated treatment well Patient left: in chair;with call bell/phone within reach  OT Visit Diagnosis: Muscle weakness (generalized) (M62.81)                Time: 4854-6270 OT Time Calculation (min): 22 min Charges:  OT General Charges $OT Visit: 1 Visit OT Evaluation $OT Eval Moderate Complexity: 1 Mod  Cleta Alberts, OTR/L Acute Rehabilitation Services Office: [phone]  Malka So 06/26/2022, 9:45 AM

## 2022-06-26 NOTE — Progress Notes (Signed)
Physical Therapy Evaluation Patient Details Name: [name] MRN: [id number] DOB: 01-30-1951 Today's Date: 06/26/2022  History of Present Illness  Pt is a 72 year old woman admitted on 1/10 with B LE pain and weakness. PMH: recent R MCA stroke with thrombectomy d/c home 1/1.  DM2, HLD, migraine, HTN, asthma, bipoar d/o, PVD, B LE wounds, anxiety and depression, gout, chronic back pain, obesity.  Clinical Impression  Pt is seen for evaluation of her tolerances to move LE's and to stand with minimal supervised help.  Pt is likely at a close approximation of baseline, and should continue PT for strength, for standing balance control and to assist in mitigating her fall risk.  Pt is expecting to go home with her husband, and should have HHPT follow up with her to plan and execute mobility practice and to generally reduce her fall risk.  See for home therapy goals and acute PT goals as are outlined on POC.       Recommendations for follow up therapy are one component of a multi-disciplinary discharge planning process, led by the attending physician.  Recommendations may be updated based on patient status, additional functional criteria and insurance authorization.  Follow Up Recommendations Home health PT Can patient physically be transported by private vehicle: No    Assistance Recommended at Discharge Frequent or constant Supervision/Assistance  Patient can return home with the following  A little help with bathing/dressing/bathroom;A little help with walking and/or transfers;Assistance with cooking/housework;Assist for transportation;Help with stairs or ramp for entrance    Equipment Recommendations None recommended by PT  Recommendations for Other Services       Functional Status Assessment Patient has had a recent decline in their functional status and demonstrates the ability to make significant improvements in function in a reasonable and predictable amount of time.      Precautions / Restrictions Precautions Precautions: Fall Precaution Comments: L hemiparesis, B LE wounds Restrictions Weight Bearing Restrictions: No Other Position/Activity Restrictions: encourage LE elevation      Mobility  Bed Mobility               General bed mobility comments: in chair when PT arrived    Transfers Overall transfer level: Needs assistance Equipment used: None Transfers: Sit to/from Stand, Bed to chair/wheelchair/BSC Sit to Stand: Supervision (from recliner)     Squat pivot transfers: Supervision     General transfer comment: supervision of all transfers with chair to bed and to stand in room    Ambulation/Gait               General Gait Details: non ambulatory at baseline  Stairs            Wheelchair Mobility    Modified Rankin (Stroke Patients Only)       Balance Overall balance assessment: Needs assistance   Sitting balance-Leahy Scale: Good       Standing balance-Leahy Scale: Poor Standing balance comment: using light HHA on furniture to support standing                             Pertinent Vitals/Pain Pain Assessment Pain Assessment: No/denies pain    Home Living Family/patient expects to be discharged to:: Private residence Living Arrangements: Spouse/significant other Available Help at Discharge: Available 24 hours/day Type of Home: Apartment Home Access: Ramped entrance       Home Layout: One level Home Equipment: Wheelchair - power;Wheelchair - Engineer, building services bed;Grab  bars - toilet;Grab bars - tub/shower;Hand held shower head;BSC/3in1;Rolling Walker (2 wheels) Additional Comments: transfers to power chair and back    Prior Function Prior Level of Function : Needs assist       Physical Assist : Mobility (physical) Mobility (physical): Transfers   Mobility Comments: transfers to power chair and back from all other surfaces       Hand Dominance   Dominant  Hand: Right    Extremity/Trunk Assessment   Upper Extremity Assessment Upper Extremity Assessment: Defer to OT evaluation    Lower Extremity Assessment Lower Extremity Assessment: Generalized weakness LLE Deficits / Details: weakness and low excursion of ROM LLE Coordination: decreased gross motor    Cervical / Trunk Assessment Cervical / Trunk Assessment: Kyphotic (particularly adipose on panniculus, creates a flexed standing posture)  Communication   Communication: No difficulties  Cognition Arousal/Alertness: Awake/alert Behavior During Therapy: WFL for tasks assessed/performed Overall Cognitive Status: No family/caregiver present to determine baseline cognitive functioning Area of Impairment: Problem solving                   Current Attention Level: Selective Memory: Decreased recall of precautions, Decreased short-term memory Following Commands: Follows one step commands with increased time     Problem Solving: Requires verbal cues, Requires tactile cues, Slow processing General Comments: unaware of moments with incontinence of both kinds        General Comments General comments (skin integrity, edema, etc.): pt was able to be assisted to stand in the room and noted her flexed posture and supervision level standing, but is unaware of her incontinence in the chair    Exercises     Assessment/Plan    PT Assessment Patient needs continued PT services  PT Problem List Decreased strength;Decreased activity tolerance;Decreased balance;Decreased mobility;Decreased coordination;Obesity;Pain       PT Treatment Interventions Functional mobility training;Therapeutic activities;Therapeutic exercise;Balance training;Neuromuscular re-education;Patient/family education    PT Goals (Current goals can be found in the Care Plan section)  Acute Rehab PT Goals Patient Stated Goal: back to PLOF PT Goal Formulation: With patient    Frequency Min 3X/week      Co-evaluation               AM-PAC PT "6 Clicks" Mobility  Outcome Measure Help needed turning from your back to your side while in a flat bed without using bedrails?: A Lot Help needed moving from lying on your back to sitting on the side of a flat bed without using bedrails?: A Lot Help needed moving to and from a bed to a chair (including a wheelchair)?: A Lot Help needed standing up from a chair using your arms (e.g., wheelchair or bedside chair)?: A Little Help needed to walk in hospital room?: Total Help needed climbing 3-5 steps with a railing? : Total 6 Click Score: 11    End of Session Equipment Utilized During Treatment: Gait belt Activity Tolerance: Patient tolerated treatment well Patient left: in chair;with call bell/phone within reach Nurse Communication: Mobility status PT Visit Diagnosis: Other abnormalities of gait and mobility (R26.89);Muscle weakness (generalized) (M62.81);Hemiplegia and hemiparesis Hemiplegia - Right/Left: Left Hemiplegia - dominant/non-dominant: Non-dominant Hemiplegia - caused by: Cerebral infarction    Time: 4098-1191 PT Time Calculation (min) (ACUTE ONLY): 15 min   Charges:   PT Evaluation $PT Eval Moderate Complexity: 1 Mod         Ramond Dial 06/26/2022, 3:40 PM  Mee Hives, PT PhD Acute Rehab Dept. Number: [facility]

## 2022-06-26 NOTE — Consult Note (Addendum)
ELECTROPHYSIOLOGY CONSULT NOTE  Patient ID: [name] MRN: [id number], DOB/AGE: 74/19/1952   Admit date: 06/25/2022 Date of Consult: 06/26/2022  Primary Physician: Sherin Quarry, PA-C Primary Cardiologist: none Reason for Consultation: Cryptogenic stroke ; recommendations regarding Implantable Loop Recorder, requested by Dr. Johnsie Cancel  History of Present Illness [name] was admitted on 06/25/2022 with unusual generalized fatigue/weakness and reduction in PO intake. With recent stroke evaluated in the ER, CT head with no new findings, labs noted mild elevation in troponin and cardiology consulted. She denies any CP, palpitations, cardiac symptoms/awareness. During their consultation, prior recommendation for loop was discussed and she reported that she was agreeable and EP was asked to see her.    PMHx includes: chronic venous stasis w/wounds/ulcers, DM, out, HLD, HTN, TIA historically and recent stroke, OA, migraine HAs,depression, bipolar d/o.  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 Cardioembolic source) neurology at that time recommended loop, EP was called but the pt ultimately decided against it and she was not seen. He had carotid angio without stenosis, preserved LVEF, LE dopplers negative for DVT.  Discharged 06/16/22   Echocardiogram this admission demonstrated    1. Left ventricular ejection fraction, by estimation, is 65 to 70%. The  left ventricle has normal function. The left ventricle has no regional  wall motion abnormalities. There is moderate left ventricular hypertrophy.  Left ventricular diastolic  parameters are consistent with Grade I diastolic dysfunction (impaired  relaxation).   2. Right ventricular systolic function is normal. The right  ventricular  size is normal. There is normal pulmonary artery systolic pressure. The  estimated right ventricular systolic pressure is 03.5 mmHg.   3. The mitral valve is normal in structure. Trivial mitral valve  regurgitation. No evidence of mitral stenosis.   4. The aortic valve was not well visualized. There is mild calcification  of the aortic valve. Aortic valve regurgitation is not visualized. No  aortic stenosis is present.   5. Increased flow velocities may be secondary to anemia, thyrotoxicosis,  hyperdynamic or high flow state.   6. The inferior vena cava is normal in size with greater than 50%  respiratory variability, suggesting right atrial pressure of 3 mmHg.   7. Cannot exclude a small PFO.   Cardiology service has signed off with no further w/u needed/recommended.   Lab work is reviewed.   Prior to admission, the patient denies chest pain, shortness of breath, dizziness, palpitations, or syncope.  They are recovering from their stroke with plans to home at discharge.    Past Medical History:  Diagnosis Date   Anxiety    Arthritis    "back, arms, legs" (03/24/2016)   Asthma    Chronic lower back pain    Colonic polyp    last colonoscopy done in 2009 with normal results per medical record   Depressive disorder    Gastric polyp    Gout    has taken allopurinol 300mg  once daily in past   Headache    History of hiatal hernia    Hypertension    Migraine    "none in awhile; might have a couple/year" (03/24/2016)   Mixed hyperlipidemia    01/2016 Total chol 141, HDL 59, LDL 63, ration 1.1   Osteoarthritis    TIA (transient  ischemic attack) 11/2014   Type II diabetes mellitus (HCC)      Surgical History:  Past Surgical History:  Procedure Laterality Date   ARTERY BIOPSY Right 08/12/2016   Procedure: BIOPSY TEMPORAL ARTERY;  Surgeon: Sherren Kerns, MD;  Location: [facility] OR;  Service: Vascular;  Laterality: Right;  BIOPSY TEMPORAL ARTERY   BREAST BIOPSY Left ~  2015   benign   CARPAL TUNNEL RELEASE Bilateral    DILATION AND CURETTAGE OF UTERUS     IR CT HEAD LTD  06/11/2022   IR PERCUTANEOUS ART THROMBECTOMY/INFUSION INTRACRANIAL INC DIAG ANGIO  06/11/2022   IR US GUIDE VASC ACCESS RIGHT  06/11/2022   KNEE ARTHROSCOPY Right 2003   in [facility]   LAPAROSCOPIC CHOLECYSTECTOMY     RADIOLOGY WITH ANESTHESIA N/A 06/11/2022   Procedure: IR WITH ANESTHESIA;  Surgeon: Radiologist, Medication, MD;  Location: [facility] OR;  Service: Radiology;  Laterality: N/A;   TUBAL LIGATION     VAGINAL HYSTERECTOMY  1982     Medications Prior to Admission  Medication Sig Dispense Refill Last Dose   acetaminophen (TYLENOL) 500 MG tablet Take 1,000 mg by mouth daily as needed for moderate pain or mild pain.   Past Week   atenolol (TENORMIN) 25 MG tablet Take 25-50 mg by mouth See admin instructions. 50 mg every morning, 25 mg at bedtime   06/24/2022 at PM   clopidogrel (PLAVIX) 75 MG tablet Take 1 tablet (75 mg total) by mouth daily. (Patient taking differently: Take 75 mg by mouth in the morning.) 21 tablet 0 06/24/2022   cyclobenzaprine (FLEXERIL) 5 MG tablet Take 5 mg by mouth 3 (three) times daily as needed for muscle spasms.   Past Week   donepezil (ARICEPT) 10 MG tablet Take 10 mg by mouth in the morning.   06/24/2022   gabapentin (NEURONTIN) 300 MG capsule Take 2 capsules (600 mg total) by mouth 2 (two) times daily. (Patient taking differently: Take 300 mg by mouth 2 (two) times daily.)   06/24/2022   glipiZIDE (GLUCOTROL) 10 MG tablet Take 10 mg by mouth 2 (two) times daily before a meal.   06/24/2022   insulin glargine (LANTUS) 100 UNIT/ML Solostar Pen Inject 70 Units into the skin every morning. And pen needles 1/day (Patient taking differently: Inject 100 Units into the skin in the morning.) 75 mL 3 06/24/2022   lurasidone (LATUDA) 40 MG TABS tablet Take 1 tablet (40 mg total) by mouth daily with breakfast. 30 tablet 1 06/24/2022   NIFEdipine (PROCARDIA XL/NIFEDICAL-XL) 90 MG 24 hr  tablet Take 90 mg by mouth in the morning.   06/24/2022   traZODone (DESYREL) 50 MG tablet Take 1 tablet (50 mg total) by mouth at bedtime and may repeat dose one time if needed. 60 tablet 1 06/24/2022   aspirin 81 MG chewable tablet Chew 1 tablet (81 mg total) by mouth daily. (Patient not taking: Reported on 06/25/2022) 30 tablet 0 Not Taking    Inpatient Medications:   aspirin  81 mg Oral Daily   atenolol  25 mg Oral QHS   clopidogrel  75 mg Oral q AM   donepezil  10 mg Oral q AM   enoxaparin (LOVENOX) injection  40 mg Subcutaneous Q24H   gabapentin  300 mg Oral BID   insulin aspart  0-15 Units Subcutaneous TID WC   insulin aspart  0-5 Units Subcutaneous QHS   insulin glargine-yfgn  30 Units Subcutaneous Daily   leptospermum manuka honey  1 Application Topical  Daily   lurasidone  40 mg Oral Q breakfast   NIFEdipine  90 mg Oral q AM   sodium chloride flush  3 mL Intravenous Q12H   traZODone  50 mg Oral QHS,MR X 1    Allergies:  Allergies  Allergen Reactions   Ace Inhibitors Swelling   Latex Itching and Swelling   Tizanidine Other (See Comments)    Tremors    Ultram [Tramadol] Nausea And Vomiting   Diprivan [Propofol] Itching   Metformin And Related Other (See Comments)    Tremors  Chills    Social History   Socioeconomic History   Marital status: Married    Spouse name: Not on file   Number of children: Not on file   Years of education: 12+   Highest education level: Not on file  Occupational History   Not on file  Tobacco Use   Smoking status: Never   Smokeless tobacco: Never  Vaping Use   Vaping Use: Never used  Substance and Sexual Activity   Alcohol use: No   Drug use: No   Sexual activity: Never  Other Topics Concern   Not on file  Social History Narrative   Right handed   Caffeine use: none   Lives with husband   Recently moved from [place]   Social Determinants of Health   Financial Resource Strain: Not on file  Food Insecurity: No Food  Insecurity (06/25/2022)   Hunger Vital Sign    Worried About Running Out of Food in the Last Year: Never true    Ran Out of Food in the Last Year: Never true  Transportation Needs: No Transportation Needs (06/25/2022)   PRAPARE - Administrator, Civil Service (Medical): No    Lack of Transportation (Non-Medical): No  Physical Activity: Not on file  Stress: Not on file  Social Connections: Not on file  Intimate Partner Violence: Not At Risk (06/25/2022)   Humiliation, Afraid, Rape, and Kick questionnaire    Fear of Current or Ex-Partner: No    Emotionally Abused: No    Physically Abused: No    Sexually Abused: No     Family History  Problem Relation Age of Onset   Stroke Father    Stroke Brother    Cancer Other    Gout Mother    Hypertension Mother    Arthritis Mother    Asthma Mother    Stroke Brother       Review of Systems: All other systems reviewed and are otherwise negative except as noted above.  Physical Exam: Vitals:   06/25/22 2033 06/26/22 0100 06/26/22 0404 06/26/22 0818  BP: (!) 130/58 (!) 141/62 139/63 139/65  Pulse: (!) 59 69 61 (!) 59  Resp: 14 (!) 23 (!) 23 20  Temp:  (!) 97.1 F (36.2 C) (!) 97.2 F (36.2 C) 97.9 F (36.6 C)  TempSrc:  Oral Oral Oral  SpO2: 100% 97% 97% 100%  Weight:  128.6 kg      GEN- The patient is well appearing, alert and oriented x 3 today.   Head- normocephalic, atraumatic Eyes-  Sclera clear, conjunctiva pink Ears- hearing intact Oropharynx- clear Neck- supple Lungs- CTA b/l, normal work of breathing Heart- RRR, no murmurs, rubs or gallops  GI- soft, NT, ND Extremities- b/l bandages, no significant edema is appreciated MS- no significant deformity or atrophy Skin- no rash or lesion Psych- euthymic mood, full affect   Labs:   Lab Results  Component Value Date  WBC 6.4 06/26/2022   HGB 10.4 (L) 06/26/2022   HCT 34.4 (L) 06/26/2022   MCV 86.9 06/26/2022   PLT 408 (H) 06/26/2022    Recent Labs   Lab 06/25/22 0329 06/25/22 0433 06/26/22 0043  NA 137   < > 138  K 3.4*   < > 3.8  CL 107   < > 109  CO2 20*  --  22  BUN 11   < > 11  CREATININE 0.86   < > 0.95  CALCIUM 9.5  --  9.1  PROT 8.5*  --   --   BILITOT 0.5  --   --   ALKPHOS 109  --   --   ALT 14  --   --   AST 15  --   --   GLUCOSE 57*   < > 123*   < > = values in this interval not displayed.   Lab Results  Component Value Date   CKTOTAL 87 06/01/2016   TROPONINI <0.03 09/09/2017   Lab Results  Component Value Date   CHOL 112 06/12/2022   CHOL 100 06/22/2019   CHOL 111 05/31/2016   Lab Results  Component Value Date   HDL 45 06/12/2022   HDL 39 (L) 06/22/2019   HDL 42 05/31/2016   Lab Results  Component Value Date   LDLCALC 45 06/12/2022   LDLCALC 48 06/22/2019   LDLCALC 53 05/31/2016   Lab Results  Component Value Date   TRIG 108 06/12/2022   TRIG 64 06/22/2019   TRIG 85 06/21/2019   Lab Results  Component Value Date   CHOLHDL 2.5 06/12/2022   CHOLHDL 2.6 06/22/2019   CHOLHDL 2.6 05/31/2016   No results found for: "LDLDIRECT"  Lab Results  Component Value Date   DDIMER 1.00 (H) 06/29/2019     Radiology/Studies:   CT HEAD WO CONTRAST (5MM) Result Date: 06/25/2022 CLINICAL DATA:  generalized weakness, recent stroke EXAM: CT HEAD WITHOUT CONTRAST TECHNIQUE: Contiguous axial images were obtained from the base of the skull through the vertex without intravenous contrast. RADIATION DOSE REDUCTION: This exam was performed according to the departmental dose-optimization program which includes automated exposure control, adjustment of the mA and/or kV according to patient size and/or use of iterative reconstruction technique. COMPARISON:  06/11/2022 FINDINGS: Brain: Normal anatomic configuration. Parenchymal volume loss is commensurate with the patient's age. Mild periventricular white matter changes are present likely reflecting the sequela of small vessel ischemia. Previously noted region of  hypodensity involving the right insular cortex and temporal operculum has largely resolved with a few small regions of cortical hypodensity within the insular cortex in keeping with subacute infarct. No abnormal intra or extra-axial mass lesion or fluid collection. No abnormal mass effect or midline shift. No evidence of acute intracranial hemorrhage or infarct. Ventricular size is normal. Cerebellum unremarkable. Vascular: No asymmetric hyperdense vasculature at the skull base. Skull: Intact Sinuses/Orbits: Paranasal sinuses are clear. Orbits are unremarkable. Other: Mastoid air cells and middle ear cavities are clear. IMPRESSION: 1. No acute intracranial hemorrhage or infarct. 2. 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 3. Mild senescent change. Electronically Signed   By: Fidela Salisbury M.D.   On: 06/25/2022 04:20   DG Chest 2 View Result Date: 06/25/2022 CLINICAL DATA:  Generalized weakness. EXAM: CHEST - 2 VIEW COMPARISON:  Portable chest 06/21/2019, CTA chest 06/22/2019 preop FINDINGS: Limited exam due to low inspiratory effort and habitus. There is central bronchial thickening consistent with  bronchitis and increased perihilar interstitial lung markings which may suggest viral bronchiolitis. Other interstitial pneumonitis is possible. Findings could also be due to low lung volumes. The peripheral lungs are clear. The sulci are sharp. Heart size and vasculature are normal with unremarkable mediastinum. Thoracic spondylosis. Follow-up study is recommended in full inspiration. IMPRESSION: There is central bronchial thickening consistent with bronchitis and increased perihilar interstitial lung markings which may suggest viral bronchiolitis. Other interstitial pneumonitis is possible. Findings could also be due to low lung volumes. A follow-up study is recommended with PA and lateral views in full inspiration. Electronically Signed   By: Almira Bar M.D.   On:  06/25/2022 03:59    12-lead ECG SR All prior EKG's in EPIC reviewed with no documented atrial fibrillation  Telemetry SR/SB  Assessment and Plan:  1. Cryptogenic stroke The patient presents with cryptogenic stroke.  I spoke at length with the patient about monitoring for afib with either a 30 day event monitor or an implantable loop recorder.  Risks, benefits, and alteratives to implantable loop recorder were discussed with the patient today.   At this time, the patient is very clear in their decision to proceed with implantable loop recorder.   Wound care was reviewed with the patient (keep incision clean and dry for 3 days).  Wound check follow up Alicia Seib be scheduled for the patient.  Please call with questions.   Sheilah Pigeon, PA-C 06/26/2022  I have seen and examined this patient with Francis Dowse.  Agree with above, note added to reflect my findings.  On exam, RRR, no murmurs, lungs clear.  Patient presented to the hospital with cryptogenic stroke. To date, no cause has been found. Dakia Schifano plan for LINQ monitor to look for atrial fibrillation. Risks and benefits discussed. Risks include but not limited to bleeding and infection. The patient understands the risks and has agreed to the procedure.  Kristin Barcus M. Kharizma Lesnick MD 06/26/2022 1:51 PM

## 2022-06-26 NOTE — Progress Notes (Signed)
Subjective:  Denies SSCP, palpitations or Dyspnea   Objective:  Vitals:   06/25/22 1800 06/25/22 2033 06/26/22 0100 06/26/22 0404  BP: (!) 126/45 (!) 130/58 (!) 141/62 139/63  Pulse: 60 (!) 59 69 61  Resp: 19 14 (!) 23 (!) 23  Temp: 97.8 F (36.6 C)  (!) 97.1 F (36.2 C) (!) 97.2 F (36.2 C)  TempSrc:   Oral Oral  SpO2: 95% 100% 97% 97%  Weight:   128.6 kg     Intake/Output from previous day:  Intake/Output Summary (Last 24 hours) at 06/26/2022 0801 Last data filed at 06/26/2022 0100 Gross per 24 hour  Intake 335.87 ml  Output 0 ml  Net 335.87 ml    Physical Exam: Obese black female Distant heart sounds Clear lungs anteriorly  Chronic lymphedema LEls with ulcerations   Lab Results: Basic Metabolic Panel: Recent Labs    06/25/22 0329 06/25/22 0433 06/26/22 0043  NA 137 140 138  K 3.4* 3.5 3.8  CL 107 108 109  CO2 20*  --  22  GLUCOSE 57* 52* 123*  BUN 11 11 11   CREATININE 0.86 0.80 0.95  CALCIUM 9.5  --  9.1  MG 1.9  --   --    Liver Function Tests: Recent Labs    06/25/22 0329  AST 15  ALT 14  ALKPHOS 109  BILITOT 0.5  PROT 8.5*  ALBUMIN 2.9*   No results for input(s): "LIPASE", "AMYLASE" in the last 72 hours. CBC: Recent Labs    06/25/22 0329 06/25/22 0433 06/26/22 0043  WBC 9.5  --  6.4  HGB 12.0 13.3 10.4*  HCT 39.4 39.0 34.4*  MCV 86.8  --  86.9  PLT 464*  --  408*     Imaging: ECHOCARDIOGRAM COMPLETE  Result Date: 06/25/2022    ECHOCARDIOGRAM REPORT   Patient Name:   [name] Date of Exam: 06/25/2022 Medical Rec #:  [id number]       Height:       65.0 in Accession #:    [id number]      Weight:       283.1 lb Date of Birth:  August 29, 1950      BSA:          2.293 m Patient Age:    72 years        BP:           117/104 mmHg Patient Gender: F               HR:           68 bpm. Exam Location:  Inpatient Procedure: 2D Echo, Cardiac Doppler and Color Doppler Indications:    Elevated Troponin  History:        Patient has prior  history of Echocardiogram examinations, most                 recent 06/11/2022. TIA and Stroke; Risk Factors:Dyslipidemia,                 Hypertension and Diabetes.  Sonographer:    Ronny Flurry Referring Phys: [id number] RONDELL A SMITH IMPRESSIONS  1. Left ventricular ejection fraction, by estimation, is 65 to 70%. The left ventricle has normal function. The left ventricle has no regional wall motion abnormalities. There is moderate left ventricular hypertrophy. Left ventricular diastolic parameters are consistent with Grade I diastolic dysfunction (impaired relaxation).  2. Right ventricular systolic function is normal. The right ventricular size is normal. There  is normal pulmonary artery systolic pressure. The estimated right ventricular systolic pressure is 24.3 mmHg.  3. The mitral valve is normal in structure. Trivial mitral valve regurgitation. No evidence of mitral stenosis.  4. The aortic valve was not well visualized. There is mild calcification of the aortic valve. Aortic valve regurgitation is not visualized. No aortic stenosis is present.  5. Increased flow velocities may be secondary to anemia, thyrotoxicosis, hyperdynamic or high flow state.  6. The inferior vena cava is normal in size with greater than 50% respiratory variability, suggesting right atrial pressure of 3 mmHg.  7. Cannot exclude a small PFO. FINDINGS  Left Ventricle: Left ventricular ejection fraction, by estimation, is 65 to 70%. The left ventricle has normal function. The left ventricle has no regional wall motion abnormalities. The left ventricular internal cavity size was normal in size. There is  moderate left ventricular hypertrophy. Left ventricular diastolic parameters are consistent with Grade I diastolic dysfunction (impaired relaxation). Right Ventricle: The right ventricular size is normal. No increase in right ventricular wall thickness. Right ventricular systolic function is normal. There is normal pulmonary artery  systolic pressure. The tricuspid regurgitant velocity is 2.31 m/s, and  with an assumed right atrial pressure of 3 mmHg, the estimated right ventricular systolic pressure is 24.3 mmHg. Left Atrium: Left atrial size was normal in size. Right Atrium: Right atrial size was normal in size. Pericardium: There is no evidence of pericardial effusion. Presence of epicardial fat layer. Mitral Valve: The mitral valve is normal in structure. Trivial mitral valve regurgitation. No evidence of mitral valve stenosis. Tricuspid Valve: The tricuspid valve is normal in structure. Tricuspid valve regurgitation is trivial. No evidence of tricuspid stenosis. Aortic Valve: The aortic valve was not well visualized. There is mild calcification of the aortic valve. Aortic valve regurgitation is not visualized. No aortic stenosis is present. Aortic valve mean gradient measures 9.0 mmHg. Aortic valve peak gradient  measures 15.5 mmHg. Aortic valve area, by VTI measures 2.46 cm. Pulmonic Valve: The pulmonic valve was normal in structure. Pulmonic valve regurgitation is trivial. No evidence of pulmonic stenosis. Aorta: The aortic root is normal in size and structure. Venous: The inferior vena cava is normal in size with greater than 50% respiratory variability, suggesting right atrial pressure of 3 mmHg. IAS/Shunts: Cannot exclude a small PFO.  LEFT VENTRICLE PLAX 2D LVIDd:         4.20 cm   Diastology LVIDs:         3.10 cm   LV e' medial:    5.98 cm/s LV PW:         0.90 cm   LV E/e' medial:  14.8 LV IVS:        1.30 cm   LV e' lateral:   6.96 cm/s LVOT diam:     2.20 cm   LV E/e' lateral: 12.8 LV SV:         97 LV SV Index:   42 LVOT Area:     3.80 cm  RIGHT VENTRICLE             IVC RV S prime:     22.00 cm/s  IVC diam: 1.30 cm TAPSE (M-mode): 2.4 cm LEFT ATRIUM             Index        RIGHT ATRIUM           Index LA diam:        4.10 cm 1.79 cm/m  RA Area:     12.50 cm LA Vol (A2C):   49.8 ml 21.72 ml/m  RA Volume:   28.10 ml   12.26 ml/m LA Vol (A4C):   47.6 ml 20.76 ml/m LA Biplane Vol: 51.7 ml 22.55 ml/m  AORTIC VALVE AV Area (Vmax):    2.42 cm AV Area (Vmean):   2.35 cm AV Area (VTI):     2.46 cm AV Vmax:           197.00 cm/s AV Vmean:          139.000 cm/s AV VTI:            0.394 m AV Peak Grad:      15.5 mmHg AV Mean Grad:      9.0 mmHg LVOT Vmax:         125.33 cm/s LVOT Vmean:        85.867 cm/s LVOT VTI:          0.255 m LVOT/AV VTI ratio: 0.65  AORTA Ao Root diam: 2.60 cm Ao Asc diam:  3.50 cm MITRAL VALVE                TRICUSPID VALVE MV Area (PHT): 3.03 cm     TR Peak grad:   21.3 mmHg MV Decel Time: 250 msec     TR Vmax:        231.00 cm/s MV E velocity: 88.80 cm/s MV A velocity: 132.00 cm/s  SHUNTS MV E/A ratio:  0.67         Systemic VTI:  0.26 m                             Systemic Diam: 2.20 cm Weston Brass MD Electronically signed by Weston Brass MD Signature Date/Time: 06/25/2022/8:03:45 PM    Final    CT HEAD WO CONTRAST ( )  Result Date: 06/25/2022 CLINICAL DATA:  generalized weakness, recent stroke EXAM: CT HEAD WITHOUT CONTRAST TECHNIQUE: Contiguous axial images were obtained from the base of the skull through the vertex without intravenous contrast. RADIATION DOSE REDUCTION: This exam was performed according to the departmental dose-optimization program which includes automated exposure control, adjustment of the mA and/or kV according to patient size and/or use of iterative reconstruction technique. COMPARISON:  06/11/2022 FINDINGS: Brain: Normal anatomic configuration. Parenchymal volume loss is commensurate with the patient's age. Mild periventricular white matter changes are present likely reflecting the sequela of small vessel ischemia. Previously noted region of hypodensity involving the right insular cortex and temporal operculum has largely resolved with a few small regions of cortical hypodensity within the insular cortex in keeping with subacute infarct. No abnormal intra or  extra-axial mass lesion or fluid collection. No abnormal mass effect or midline shift. No evidence of acute intracranial hemorrhage or infarct. Ventricular size is normal. Cerebellum unremarkable. Vascular: No asymmetric hyperdense vasculature at the skull base. Skull: Intact Sinuses/Orbits: Paranasal sinuses are clear. Orbits are unremarkable. Other: Mastoid air cells and middle ear cavities are clear. IMPRESSION: 1. No acute intracranial hemorrhage or infarct. 2. 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 3. Mild senescent change. Electronically Signed   By: Helyn Numbers M.D.   On: 06/25/2022 04:20   DG Chest 2 View  Result Date: 06/25/2022 CLINICAL DATA:  Generalized weakness. EXAM: CHEST - 2 VIEW COMPARISON:  Portable chest 06/21/2019, CTA chest 06/22/2019 preop FINDINGS: Limited exam due to low inspiratory effort and habitus. There  is central bronchial thickening consistent with bronchitis and increased perihilar interstitial lung markings which may suggest viral bronchiolitis. Other interstitial pneumonitis is possible. Findings could also be due to low lung volumes. The peripheral lungs are clear. The sulci are sharp. Heart size and vasculature are normal with unremarkable mediastinum. Thoracic spondylosis. Follow-up study is recommended in full inspiration. IMPRESSION: There is central bronchial thickening consistent with bronchitis and increased perihilar interstitial lung markings which may suggest viral bronchiolitis. Other interstitial pneumonitis is possible. Findings could also be due to low lung volumes. A follow-up study is recommended with PA and lateral views in full inspiration. Electronically Signed   By: Telford Nab M.D.   On: 06/25/2022 03:59    Cardiac Studies:  ECG: SR no acute changes    Telemetry:  NSR   Echo:  EF 65-70% no RWMAls   Medications:    aspirin  81 mg Oral Daily   atenolol  50 mg Oral Daily   And   atenolol  25 mg  Oral QHS   clopidogrel  75 mg Oral q AM   donepezil  10 mg Oral q AM   enoxaparin (LOVENOX) injection  40 mg Subcutaneous Q24H   gabapentin  300 mg Oral BID   insulin aspart  0-15 Units Subcutaneous TID WC   insulin aspart  0-5 Units Subcutaneous QHS   insulin glargine-yfgn  30 Units Subcutaneous Daily   leptospermum manuka honey  1 Application Topical Daily   lurasidone  40 mg Oral Q breakfast   NIFEdipine  90 mg Oral q AM   sodium chloride flush  3 mL Intravenous Q12H   traZODone  50 mg Oral QHS,MR X 1      cefTRIAXone (ROCEPHIN)  IV Stopped (06/25/22 1658)    Assessment/Plan:   Elevated troponin:  no chest pain acute ECG changes TTE with hyper dynamic EF and no RWMAls Troponin trend flat. No evidence of acute coronary syndrome no need for further cardiology w/u CVA:  continue ASA/Plavix Lymphedema:  wound care ? In [place] dressings on IV ceftriaxone   Cardology will sign off    Jenkins Rouge 06/26/2022, 8:01 AM

## 2022-06-26 NOTE — Progress Notes (Signed)
PT Cancellation Note  Patient Details Name: [name] MRN: [id number] DOB: 11-Nov-1950   Cancelled Treatment:    Reason Eval/Treat Not Completed: Other (comment).  Pt is unavailable for two attempts, bathing and then legs getting wrapped.  Follow up as time and pt allow.   Ramond Dial 06/26/2022, 11:36 AM  Mee Hives, PT PhD Acute Rehab Dept. Number: [place] [facility] [place]

## 2022-06-27 ENCOUNTER — Telehealth: Payer: Self-pay

## 2022-06-27 ENCOUNTER — Encounter (HOSPITAL_COMMUNITY): Payer: Self-pay | Admitting: Cardiology

## 2022-06-27 DIAGNOSIS — R531 Weakness: Secondary | ICD-10-CM | POA: Diagnosis not present

## 2022-06-27 LAB — RENAL FUNCTION PANEL
Albumin: 2.4 g/dL — ABNORMAL LOW (ref 3.5–5.0)
Anion gap: 9 (ref 5–15)
BUN: 7 mg/dL — ABNORMAL LOW (ref 8–23)
CO2: 24 mmol/L (ref 22–32)
Calcium: 9 mg/dL (ref 8.9–10.3)
Chloride: 103 mmol/L (ref 98–111)
Creatinine, Ser: 0.77 mg/dL (ref 0.44–1.00)
GFR, Estimated: >60 mL/min (ref >60)
Glucose, Bld: 227 mg/dL — ABNORMAL HIGH (ref 70–99)
Phosphorus: 3.1 mg/dL (ref 2.5–4.6)
Potassium: 4 mmol/L (ref 3.5–5.1)
Sodium: 136 mmol/L (ref 135–145)

## 2022-06-27 LAB — GLUCOSE, CAPILLARY
Glucose-Capillary: 132 mg/dL — ABNORMAL HIGH (ref 70–99)
Glucose-Capillary: 139 mg/dL — ABNORMAL HIGH (ref 70–99)
Glucose-Capillary: 204 mg/dL — ABNORMAL HIGH (ref 70–99)
Glucose-Capillary: 178 mg/dL — ABNORMAL HIGH (ref 70–99)

## 2022-06-27 LAB — CBC
HCT: 37.2 % (ref 36.0–46.0)
Hemoglobin: 11.5 g/dL — ABNORMAL LOW (ref 12.0–15.0)
MCH: 26.8 pg (ref 26.0–34.0)
MCHC: 30.9 g/dL (ref 30.0–36.0)
MCV: 86.7 fL (ref 80.0–100.0)
Platelets: 405 10*3/uL — ABNORMAL HIGH (ref 150–400)
RBC: 4.29 MIL/uL (ref 3.87–5.11)
RDW: 17.3 % — ABNORMAL HIGH (ref 11.5–15.5)
WBC: 5.9 10*3/uL (ref 4.0–10.5)
nRBC: 0 % (ref 0.0–0.2)

## 2022-06-27 LAB — MAGNESIUM: Magnesium: 2.1 mg/dL (ref 1.7–2.4)

## 2022-06-27 NOTE — Patient Outreach (Signed)
  Emmi Stroke Care Coordination Follow Up  06/27/2022 Name:  [name] MRN:  [id number] DOB:  [birth year]  Subjective: [name] is a 72 y.o. year old female who is a primary care patient of Sherin Quarry, [place]   An Emmi alert was received on 06/26/22 indicating patient responded to questions: Lost interest in things they used to enjoy? Sad, hopeless, anxious, or empty?Marland Kitchen   Upon chart review noted that patient currently inpatient and should not be receiving automated  post discharge EMMI-Stroke calls.  Care Coordination Interventions:  Yes, provided   Follow up plan:  RN CM will notify CMA to deactivate EMMI-Stroke calls.     Encounter Outcome:  No call warranted.    Enzo Montgomery, RN,BSN,CCM [facility] Telephonic Care Management Coordinator Direct Phone: [phone] Toll Free: [phone] Fax: [phone]

## 2022-06-27 NOTE — Progress Notes (Signed)
Physical Therapy Treatment Patient Details Name: [name] MRN: [id number] DOB: 12/31/1950 Today's Date: 06/27/2022   History of Present Illness Pt is a 72 y.o. female admitted 06/25/22 with BLE pain and weakness. Workup for hypoglycemia, UTI(?). PMH includes recent MCA stroke s/p thrombectomy (06/16/22), DM2, HTN, PVD, asthma, HLD, BLE wounds, gout, chronic back pain, anxiety, depression, obesity.   PT Comments    Pt progressing with mobility. Pt able to perform standing and step pivot transfers from recliner>transport chair>bed with min guard for balance; happy for chance to get outside of room, being pushed in transport chair. Pt motivated to participate and hopeful for return home soon. Will continue to follow acutely to address established goals.    Recommendations for follow up therapy are one component of a multi-disciplinary discharge planning process, led by the attending physician.  Recommendations may be updated based on patient status, additional functional criteria and insurance authorization.  Follow Up Recommendations  Home health PT Can patient physically be transported by private vehicle: Yes   Assistance Recommended at Discharge Intermittent Supervision/Assistance  Patient can return home with the following A little help with walking and/or transfers;A little help with bathing/dressing/bathroom;Assistance with cooking/housework;Assist for transportation;Help with stairs or ramp for entrance   Equipment Recommendations  None recommended by PT    Recommendations for Other Services Mobility Specialist      Precautions / Restrictions Precautions Precautions: Fall;Other (comment) Precaution Comments: h/o L hemiparesis, BLE wounds wrapped     Mobility  Bed Mobility Overal bed mobility: Independent Bed Mobility: Sit to Supine       Sit to supine: Independent   General bed mobility comments: return to supine with bed flat, scooting self up without assist     Transfers Overall transfer level: Needs assistance Equipment used: None Transfers: Sit to/from Stand, Bed to chair/wheelchair/BSC Sit to Stand: Supervision Stand pivot transfers: Min guard         General transfer comment: stand/step pivot from recliner to transport chair, then transport chair to bed, leaning forward to use BUE support though able to take pivotal steps; min guard for balance/safety    Ambulation/Gait                   Stairs             Wheelchair Mobility    Modified Rankin (Stroke Patients Only)       Balance Overall balance assessment: Needs assistance Sitting-balance support: No upper extremity supported, Feet supported Sitting balance-Leahy Scale: Good     Standing balance support: Single extremity supported, Bilateral upper extremity supported, During functional activity Standing balance-Leahy Scale: Poor Standing balance comment: UE support for standing and step pivot                            Cognition Arousal/Alertness: Awake/alert Behavior During Therapy: WFL for tasks assessed/performed Overall Cognitive Status: History of cognitive impairments - at baseline Area of Impairment: Memory                     Memory: Decreased short-term memory Following Commands: Follows one step commands consistently       General Comments: per chart, husband reports baseline memory deficits        Exercises      General Comments General comments (skin integrity, edema, etc.): pt happy to get out of room in transport chair this session      Pertinent Vitals/Pain Pain  Assessment Pain Assessment: No/denies pain Pain Intervention(s): Monitored during session    Home Living                          Prior Function            PT Goals (current goals can now be found in the care plan section) Progress towards PT goals: Progressing toward goals    Frequency    Min 3X/week      PT Plan  Current plan remains appropriate    Co-evaluation              AM-PAC PT "6 Clicks" Mobility   Outcome Measure  Help needed turning from your back to your side while in a flat bed without using bedrails?: None Help needed moving from lying on your back to sitting on the side of a flat bed without using bedrails?: A Little Help needed moving to and from a bed to a chair (including a wheelchair)?: A Little Help needed standing up from a chair using your arms (e.g., wheelchair or bedside chair)?: A Little Help needed to walk in hospital room?: Total Help needed climbing 3-5 steps with a railing? : Total 6 Click Score: 15    End of Session   Activity Tolerance: Patient tolerated treatment well Patient left: in bed;with call bell/phone within reach;with bed alarm set Nurse Communication: Mobility status PT Visit Diagnosis: Other abnormalities of gait and mobility (R26.89);Muscle weakness (generalized) (M62.81);Hemiplegia and hemiparesis     Time: 0071-2197 PT Time Calculation (min) (ACUTE ONLY): 27 min  Charges:  $Therapeutic Activity: 8-22 mins                     Mabeline Caras, PT, DPT Acute Rehabilitation Services  Personal: [facility] Office: [place] 06/27/2022, 5:39 PM

## 2022-06-27 NOTE — Patient Outreach (Signed)
  Received a red flag Emmi stroke notification for Ms. [name] . I have assigned [name] Montgomery, RN to call for follow up and determine if there are any Case Management needs.    [facility], [place], [place] [facility] [phone]

## 2022-06-27 NOTE — Plan of Care (Signed)
  Problem: Clinical Measurements: Goal: Will remain free from infection Outcome: Completed/Met   Problem: Nutrition: Goal: Adequate nutrition will be maintained Outcome: Completed/Met   Problem: Pain Managment: Goal: General experience of comfort will improve Outcome: Completed/Met   

## 2022-06-27 NOTE — Progress Notes (Signed)
Occupational Therapy Treatment Patient Details Name: [name] MRN: [id number] DOB: 04-Dec-1950 Today's Date: 06/27/2022   History of present illness Pt is a 72 year old woman admitted on 1/10 with B LE pain and weakness. PMH: recent R MCA stroke with thrombectomy d/c home 1/1.  DM2, HLD, migraine, HTN, asthma, bipoar d/o, PVD, B LE wounds, anxiety and depression, gout, chronic back pain, obesity.   OT comments  Pt attempting to eat lunch in sidelying. Assist for socks over LE dressings. Supervised transfer to chair.  Set up for grooming and eating. Pt is eager to go home. VSS on RA.   Recommendations for follow up therapy are one component of a multi-disciplinary discharge planning process, led by the attending physician.  Recommendations may be updated based on patient status, additional functional criteria and insurance authorization.    Follow Up Recommendations  Home health OT     Assistance Recommended at Discharge Intermittent Supervision/Assistance  Patient can return home with the following  A little help with walking and/or transfers;A lot of help with bathing/dressing/bathroom;Assist for transportation   Equipment Recommendations  None recommended by OT    Recommendations for Other Services      Precautions / Restrictions Precautions Precautions: Fall       Mobility Bed Mobility Overal bed mobility: Modified Independent                  Transfers Overall transfer level: Needs assistance Equipment used: None Transfers: Bed to chair/wheelchair/BSC     Squat pivot transfers: Supervision             Balance Overall balance assessment: Needs assistance   Sitting balance-Leahy Scale: Good       Standing balance-Leahy Scale: Poor                             ADL either performed or assessed with clinical judgement   ADL Overall ADL's : Needs assistance/impaired Eating/Feeding: Independent;Sitting   Grooming: Wash/dry  hands;Wash/dry face;Sitting;Set up               Lower Body Dressing: Total assistance;Bed level Lower Body Dressing Details (indicate cue type and reason): socks                    Extremity/Trunk Assessment              Vision       Perception     Praxis      Cognition Arousal/Alertness: Awake/alert Behavior During Therapy: WFL for tasks assessed/performed Overall Cognitive Status: No family/caregiver present to determine baseline cognitive functioning Area of Impairment: Memory                     Memory: Decreased short-term memory                  Exercises      Shoulder Instructions       General Comments      Pertinent Vitals/ Pain       Pain Assessment Pain Assessment: No/denies pain  Home Living                                          Prior Functioning/Environment              Frequency  Min 2X/week  Progress Toward Goals  OT Goals(current goals can now be found in the care plan section)  Progress towards OT goals: Progressing toward goals  Acute Rehab OT Goals OT Goal Formulation: With patient Time For Goal Achievement: 07/10/22 Potential to Achieve Goals: Good  Plan Discharge plan remains appropriate    Co-evaluation                 AM-PAC OT "6 Clicks" Daily Activity     Outcome Measure   Help from another person eating meals?: None Help from another person taking care of personal grooming?: A Little Help from another person toileting, which includes using toliet, bedpan, or urinal?: Total Help from another person bathing (including washing, rinsing, drying)?: A Little Help from another person to put on and taking off regular upper body clothing?: A Little Help from another person to put on and taking off regular lower body clothing?: A Little 6 Click Score: 17    End of Session Equipment Utilized During Treatment: Gait belt  OT Visit Diagnosis: Muscle  weakness (generalized) (M62.81)   Activity Tolerance Patient tolerated treatment well   Patient Left in chair;with call bell/phone within reach;with chair alarm set   Nurse Communication          Time: 9030-0923 OT Time Calculation (min): 19 min  Charges: OT General Charges $OT Visit: 1 Visit OT Treatments $Self Care/Home Management : 8-22 mins  Cleta Alberts, OTR/L Acute Rehabilitation Services Office: [phone]   Malka So 06/27/2022, 2:11 PM

## 2022-06-27 NOTE — Progress Notes (Signed)
PROGRESS NOTE  [name] UXL:[id number] DOB: 11/25/1950   PCP: Hilton Cork, PA-C  Patient is from: Home.  Lives with husband.  Uses electric scooter for mobility.  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  Patient returns to the hospital with generalized weakness.  Also concerned about therapy and wound care staff not visiting lately.  Patient was discharged with a plan to have heart monitor.  On arrival to ED, vital stable except for mild tachycardia.  On room air.  Glucose 52.  CT head showed evolving subacute right insular cortex and temporal operculum infarct.  Troponin 185> 195.  CXR suggested bronchitis/possible bronchiolitis.  Influenza, COVID-19 and RSV PCR nonreactive.  UA with nitrite and many bacteria.  Patient was given full dose aspirin.  Cardiology consulted.  Started on IV ceftriaxone for possible UTI.   Patient back to baseline.  Cardiology planning loop recorder implantation.  06/27/2022: Patient seen.  Patient seems to have improved significantly.  Patient will be discharged once cleared for discharge by the cardiology team.  Loop recorder implantation is planned.  Subjective: -No complaints.  Objective: Vitals:   06/26/22 1958 06/27/22 0009 06/27/22 0423 06/27/22 1134  BP: (!) 139/57  120/73 128/61  Pulse: 66  61 (!) 54  Resp: 16 17 16 15   Temp: 97.8 F (36.6 C) 97.8 F (36.6 C) 97.8 F (36.6 C) 97.6 F (36.4 C)  TempSrc: Oral Oral Oral Oral  SpO2: 99%  99% 100%  Weight:  125.4 kg      Examination:  GENERAL: No apparent distress.  Patient is obese.  Patient is awake and  alert.   HEENT: Patient is pale.  No jaundice. NECK: Supple.    RESP: Clear. CVS: S1-S2. ABD/GI/GU: Obese, soft and nontender. EXT: No leg edema.   NEURO: Awake and alert.  Patient moves all extremities.  Procedures:  None  Microbiology summarized: COVID-19, influenza and RSV PCR nonreactive. Blood cultures NGTD Urine culture grew multiple organisms, likely contaminated.  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  BMP glucose was 52 on arrival.  Patient is on insulin and glipizide which increases her risk of hypoglycemia.  CT head suggested subacute right-sided MCA.  UA with nitrite and many bacteria but patient denies UTI symptoms.  She is currently back to baseline. -PT/OT 06/27/2022: Resolved significantly.  Pursue disposition.  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  CT head consistent with a recent CVA.  No acute neuro finding other than residual left hemiparesis.  Patient does support to be set up with a Zio patch that has now completed. -Plan for loop recorder implantation by cardiology  -Continue aspirin and  Plavix for a total of 3 weeks followed by aspirin alone as previously planned -Not on statin with LDL of 45.   Elevated troponin: Likely demand ischemia.  No significant delta.  Patient without chest pain.  TTE without significant finding.  Already on DAPT after recent CVA. -Continue DAPT -Cardiology signed off.   Uncontrolled IDDM-2 with hyperglycemia: A1c 9.5% on 06/11/2022.  Initial glucose 52.   Patient is on glipizide and insulin which could increase the risk of hypoglycemia. Recent Labs  Lab 06/26/22 0924 06/26/22 1229 06/26/22 2110 06/27/22 0557 06/27/22 1135  GLUCAP 143* 116* 262* 132* 139*   -Recommend discontinuing glipizide on discharge. -Continue current insulin regimen -Not on statin.  LDL was 45.   Bacteriuria: Positive for nitrite and bacteria.  Patient denies UTI symptoms.  She has no fever or leukocytosis.  Started on IV ceftriaxone. -Will let her complete 3 days of antibiotic -Follow urine cultures 06/27/2022: Urine culture grew multiple organisms.   Hypokalemia: Resolved. Acute.  Potassium 3.4.  Patient has been given potassium chloride p.o. per day. -Continue to monitor and replace as needed   Essential hypertension normotensive for most part. -Continue home regimen-atenolol and nifedipine.   Chronic bilateral lower extremity wounds Patient has chronic wounds of the lower extremities.  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  Good insight.  "Issues with short-term memory per husband". -Continue Aricept  History of PVD/venous stasis ulcers: No signs of infection. -Continue wound care  History of bipolar disorder: Stable. -Continue home Latuda   Gout -Continue allopurinol  Morbid obesity Body mass index is 46 kg/m.           DVT prophylaxis:    Code Status: Full code Family Communication: None at bedside Level of care: Telemetry Cardiac Status is: Observation The patient remains OBS appropriate and will d/c before 2 midnights.   Final disposition: Home with home health Consultants:  Cardiology  35 minutes with more than 50% spent in reviewing records, counseling patient/family and coordinating care.   Sch Meds:  Scheduled Meds:  aspirin  81 mg Oral Daily   atenolol  25 mg Oral QHS   clopidogrel  75 mg Oral q AM   donepezil  10 mg Oral q AM   enoxaparin  (LOVENOX) injection  60 mg Subcutaneous Q24H   gabapentin  300 mg Oral BID   insulin aspart  0-15 Units Subcutaneous TID WC   insulin aspart  0-5 Units Subcutaneous QHS   insulin glargine-yfgn  30 Units Subcutaneous Daily   leptospermum manuka honey  1 Application Topical Daily   lurasidone  40 mg Oral Q breakfast   NIFEdipine  90 mg Oral q AM   sodium chloride flush  3 mL Intravenous Q12H   traZODone  50 mg Oral QHS,MR X 1   Continuous Infusions:  cefTRIAXone (ROCEPHIN)  IV 2 g (06/26/22 1440)   PRN Meds:.acetaminophen **OR** acetaminophen, albuterol, cyclobenzaprine, ondansetron **OR** ondansetron (ZOFRAN) IV, mouth rinse  Antimicrobials: Anti-infectives (From admission, onward)    Start     Dose/Rate Route Frequency Ordered Stop   06/25/22 1500  cefTRIAXone (ROCEPHIN) 2 g in sodium chloride 0.9 % 100 mL IVPB        2 g 200 mL/hr over 30 Minutes Intravenous Every 24 hours 06/25/22 1447 06/27/22 2359        I have personally reviewed the following labs and images: CBC: Recent Labs  Lab 06/25/22 0329 06/25/22  2440 06/26/22 0043 06/27/22 0115  WBC 9.5  --  6.4 5.9  HGB 12.0 13.3 10.4* 11.5*  HCT 39.4 39.0 34.4* 37.2  MCV 86.8  --  86.9 86.7  PLT 464*  --  408* 405*    BMP &GFR Recent Labs  Lab 06/25/22 0329 06/25/22 0433 06/26/22 0043 06/27/22 0115  NA 137 140 138 136  K 3.4* 3.5 3.8 4.0  CL 107 108 109 103  CO2 20*  --  22 24  GLUCOSE 57* 52* 123* 227*  BUN 11 11 11  7*  CREATININE 0.86 0.80 0.95 0.77  CALCIUM 9.5  --  9.1 9.0  MG 1.9  --   --  2.1  PHOS  --   --   --  3.1    Estimated Creatinine Clearance: 83.4 mL/min (by C-G formula based on SCr of 0.77 mg/dL). Liver & Pancreas: Recent Labs  Lab 06/25/22 0329 06/27/22 0115  AST 15  --   ALT 14  --   ALKPHOS 109  --   BILITOT 0.5  --   PROT 8.5*  --   ALBUMIN 2.9* 2.4*    No results for input(s): "LIPASE", "AMYLASE" in the last 168 hours. No results for input(s): "AMMONIA" in the last 168  hours. Diabetic: No results for input(s): "HGBA1C" in the last 72 hours. Recent Labs  Lab 06/26/22 0924 06/26/22 1229 06/26/22 2110 06/27/22 0557 06/27/22 1135  GLUCAP 143* 116* 262* 132* 139*    Cardiac Enzymes: No results for input(s): "CKTOTAL", "CKMB", "CKMBINDEX", "TROPONINI" in the last 168 hours. No results for input(s): "PROBNP" in the last 8760 hours. Coagulation Profile: No results for input(s): "INR", "PROTIME" in the last 168 hours. Thyroid Function Tests: No results for input(s): "TSH", "T4TOTAL", "FREET4", "T3FREE", "THYROIDAB" in the last 72 hours. Lipid Profile: No results for input(s): "CHOL", "HDL", "LDLCALC", "TRIG", "CHOLHDL", "LDLDIRECT" in the last 72 hours. Anemia Panel: No results for input(s): "VITAMINB12", "FOLATE", "FERRITIN", "TIBC", "IRON", "RETICCTPCT" in the last 72 hours. Urine analysis:    Component Value Date/Time   COLORURINE YELLOW 06/25/2022 1230   APPEARANCEUR CLEAR 06/25/2022 1230   LABSPEC 1.005 06/25/2022 1230   PHURINE 7.0 06/25/2022 1230   GLUCOSEU NEGATIVE 06/25/2022 1230   HGBUR NEGATIVE 06/25/2022 [place] 06/25/2022 1230   KETONESUR NEGATIVE 06/25/2022 1230   PROTEINUR NEGATIVE 06/25/2022 1230   NITRITE POSITIVE (A) 06/25/2022 1230   LEUKOCYTESUR NEGATIVE 06/25/2022 1230   Sepsis Labs: Invalid input(s): "PROCALCITONIN", "LACTICIDVEN"  Microbiology: Recent Results (from the past 240 hour(s))  Blood culture (routine x 2)     Status: None (Preliminary result)   Collection Time: 06/25/22  3:29 AM   Specimen: BLOOD  Result Value Ref Range Status   Specimen Description BLOOD RIGHT ANTECUBITAL  Final   Special Requests AEROBIC BOTTLE ONLY Blood Culture adequate volume  Final   Culture   Final    NO GROWTH 1 DAY Performed at [facility], [place] [address] [place], [place] [address]    Report Status PENDING  Incomplete  Resp panel by RT-PCR (RSV, Flu A&B, Covid) Anterior Nasal Swab     Status: None    Collection Time: 06/25/22  4:29 AM   Specimen: Anterior Nasal Swab  Result Value Ref Range Status   SARS Coronavirus 2 by RT PCR NEGATIVE NEGATIVE Final    Comment: (NOTE) SARS-CoV-2 target nucleic acids are NOT DETECTED.  The SARS-CoV-2 RNA is generally detectable in upper respiratory specimens during the acute phase  of infection. The lowest concentration of SARS-CoV-2 viral copies this assay can detect is 138 copies/mL. A negative result does not preclude SARS-Cov-2 infection and should not be used as the sole basis for treatment or other patient management decisions. A negative result may occur with  improper specimen collection/handling, submission of specimen other than nasopharyngeal swab, presence of viral mutation(s) within the areas targeted by this assay, and inadequate number of viral copies(<138 copies/mL). A negative result must be combined with clinical observations, patient history, and epidemiological information. The expected result is Negative.  Fact Sheet for Patients:  BloggerCourse.com  Fact Sheet for Healthcare Providers:  SeriousBroker.it  This test is no t yet approved or cleared by the [place] FDA and  has been authorized for detection and/or diagnosis of SARS-CoV-2 by FDA under an Emergency Use Authorization (EUA). This EUA will remain  in effect (meaning this test can be used) for the duration of the COVID-19 declaration under Section 564(b)(1) of the Act, 21 U.S.C.section 360bbb-3(b)(1), unless the authorization is terminated  or revoked sooner.       Influenza A by PCR NEGATIVE NEGATIVE Final   Influenza B by PCR NEGATIVE NEGATIVE Final    Comment: (NOTE) The Xpert Xpress SARS-CoV-2/FLU/RSV plus assay is intended as an aid in the diagnosis of influenza from Nasopharyngeal swab specimens and should not be used as a sole basis for treatment. Nasal washings and aspirates are unacceptable for  Xpert Xpress SARS-CoV-2/FLU/RSV testing.  Fact Sheet for Patients: BloggerCourse.com  Fact Sheet for Healthcare Providers: SeriousBroker.it  This test is not yet approved or cleared by the [place] FDA and has been authorized for detection and/or diagnosis of SARS-CoV-2 by FDA under an Emergency Use Authorization (EUA). This EUA will remain in effect (meaning this test can be used) for the duration of the COVID-19 declaration under Section 564(b)(1) of the Act, 21 U.S.C. section 360bbb-3(b)(1), unless the authorization is terminated or revoked.     Resp Syncytial Virus by PCR NEGATIVE NEGATIVE Final    Comment: (NOTE) Fact Sheet for Patients: BloggerCourse.com  Fact Sheet for Healthcare Providers: SeriousBroker.it  This test is not yet approved or cleared by the [place] FDA and has been authorized for detection and/or diagnosis of SARS-CoV-2 by FDA under an Emergency Use Authorization (EUA). This EUA will remain in effect (meaning this test can be used) for the duration of the COVID-19 declaration under Section 564(b)(1) of the Act, 21 U.S.C. section 360bbb-3(b)(1), unless the authorization is terminated or revoked.  Performed at [facility], [address], [place], [place] [address]   Blood culture (routine x 2)     Status: None (Preliminary result)   Collection Time: 06/25/22  6:27 AM   Specimen: BLOOD LEFT FOREARM  Result Value Ref Range Status   Specimen Description BLOOD LEFT FOREARM  Final   Special Requests   Final    BOTTLES DRAWN AEROBIC AND ANAEROBIC Blood Culture results may not be optimal due to an inadequate volume of blood received in culture bottles   Culture   Final    NO GROWTH 1 DAY Performed at [facility], [address], [place], [place] [address]    Report Status PENDING  Incomplete  Urine Culture     Status: Abnormal    Collection Time: 06/25/22 12:30 PM   Specimen: Urine, Clean Catch  Result Value Ref Range Status   Specimen Description URINE, CLEAN CATCH  Final   Special Requests   Final  NONE Performed at [facility], [place] [address] [place], [place] [address]    Culture MULTIPLE SPECIES PRESENT, SUGGEST RECOLLECTION (A)  Final   Report Status 06/26/2022 FINAL  Final    Radiology Studies: EP PPM/ICD IMPLANT  Result Date: 06/26/2022 SURGEON:  Will Meredith Leeds, MD   PREPROCEDURE DIAGNOSIS:  Cryptogenic stroke   POSTPROCEDURE DIAGNOSIS: Cryptogenic stroke    PROCEDURES:  1. Implantable loop recorder implantation   INTRODUCTION:  SADIA BELFIORE presents with a history of cryptogenic stroke The costs of loop recorder monitoring have been discussed with the patient.   DESCRIPTION OF PROCEDURE:  Informed written consent was obtained.  The patient required no sedation for the procedure today.  Mapping over the patient's chest was performed to identify the area where electrograms were most prominent for ILR recording.  This area was found to be the left parasternal region over the 4th intercostal space. The patients left chest was therefore prepped and draped in the usual sterile fashion. The skin overlying the left parasternal region was infiltrated with lidocaine for local analgesia.  A 0.5-cm incision was made over the left parasternal region over the 3rd intercostal space.  A subcutaneous ILR pocket was fashioned using a combination of sharp and blunt dissection.  A Abbott Assert IQ EL+ (serial # [id number]) implantable loop recorder was then placed into the pocket  R waves were very prominent and measured 0.87mV.  Steri- Strips and a sterile dressing were then applied.  There were no early apparent complications.   CONCLUSIONS:  1. Successful implantation of a implantable loop recorder for a history of cryptogenic stroke  2. No early apparent complications. Will Meredith Leeds, MD 06/26/2022 4:34 PM       Dana Allan, MD. [facility]  If 7PM-7AM, please contact night-coverage [email or web address] 06/27/2022, 12:39 PM

## 2022-06-28 DIAGNOSIS — R531 Weakness: Secondary | ICD-10-CM | POA: Diagnosis not present

## 2022-06-28 DIAGNOSIS — E876 Hypokalemia: Secondary | ICD-10-CM | POA: Diagnosis not present

## 2022-06-28 DIAGNOSIS — Z8673 Personal history of transient ischemic attack (TIA), and cerebral infarction without residual deficits: Secondary | ICD-10-CM | POA: Diagnosis not present

## 2022-06-28 DIAGNOSIS — R8290 Unspecified abnormal findings in urine: Secondary | ICD-10-CM | POA: Diagnosis not present

## 2022-06-28 LAB — GLUCOSE, CAPILLARY
Glucose-Capillary: 98 mg/dL (ref 70–99)
Glucose-Capillary: 125 mg/dL — ABNORMAL HIGH (ref 70–99)

## 2022-06-28 MED ORDER — ACETAMINOPHEN 325 MG PO TABS: 650.0000 mg | ORAL_TABLET | Freq: Four times a day (QID) | ORAL | Status: DC | PRN

## 2022-06-28 MED ORDER — INSULIN GLARGINE 100 UNIT/ML SOLOSTAR PEN: 45.0000 [IU] | PEN_INJECTOR | SUBCUTANEOUS | 3 refills | Status: DC

## 2022-06-28 MED ORDER — INSULIN GLARGINE 100 UNIT/ML SOLOSTAR PEN: 30.0000 [IU] | PEN_INJECTOR | SUBCUTANEOUS | 3 refills | Status: DC

## 2022-06-28 MED ORDER — CLOPIDOGREL BISULFATE 75 MG PO TABS: 75.0000 mg | ORAL_TABLET | Freq: Every day | ORAL | 0 refills | Status: AC

## 2022-06-28 NOTE — Progress Notes (Signed)
Pt was escorted via w/c to the [facility] while husband was waiting in the truck. 2 NT's assisted pt getting out of the w/c to the husbands truck. Pt was unable to stand up and was assisted to the floor since she was not able to get into the truck. Pt was not injured. AC, this RN, along with 2 NTs and other staff assisted patient from the floor to the truck by using the lift. Pt was prior offered transportation by [facility] but patient refused. After the incident patient was offered to go to ED if needed. Pt and husband stated that patient is fine and that they will go home.Husband also stated that he has lift at home and that patient will have no issues getting from the truck to her house.

## 2022-06-28 NOTE — TOC Transition Note (Signed)
Transition of Care [facility]) - CM/SW Discharge Note   Patient Details  Name: [name] MRN: [id number] Date of Birth: 03/04/1951  [facility] CM/SW Contact:  Carles Collet, RN Phone Number: 06/28/2022, 9:14 AM   Clinical Narrative:     Damaris Schooner w patient at bedside. She states that her spouse will be able to provide transportation home. She has needed DME at home and would like to continue [facility] services w [facility]. Notified [facility] liaison that patient will return home today. Per [facility] patient will also need to schedule with her PCP soon, as they will be getting further [place] orders from the PCP. I have spoken to the patient about this and she understands to call the PCP office Monday to schedule.   Final next level of care: Home w Home Health Services Barriers to Discharge: No Barriers Identified   Patient Goals and CMS Choice CMS Medicare.gov Compare Post Acute Care list provided to:: Patient Choice offered to / list presented to : Patient  Discharge Placement                         Discharge Plan and Services Additional resources added to the After Visit Summary for                  DME Arranged: N/A           HH Agency: [place] Date [place]: 06/28/22 Time [place]: 2202 Representative spoke with at [place]: [place] Determinants of Health ([place]) Interventions SDOH Screenings   Food Insecurity: No Food Insecurity (06/25/2022)  Housing: Low Risk  (06/25/2022)  Transportation Needs: No Transportation Needs (06/25/2022)  Utilities: Not At Risk (06/25/2022)  Tobacco Use: Low Risk  (06/27/2022)     Readmission Risk Interventions     No data to display

## 2022-06-28 NOTE — Discharge Summary (Signed)
Physician Discharge Summary  [name] ION:[id number] DOB: [birth year] DOA: 06/25/2022  PCP: [name] CorkFleming, [name] M, PA-C  Admit date: 06/25/2022 Discharge date: 06/28/2022  Admitted From: (Home) Disposition:  (Home)  Recommendations for Outpatient Follow-up:  Follow up with PCP in 1-2 weeks Please obtain BMP/CBC in one week  Home Health: (YES)   Discharge Condition: (Stable) CODE STATUS: (FULL) Diet recommendation: Heart Healthy / Carb Modified   Brief/Interim Summary:  72 year old F with PMH of IDDM-2, HTN, asthma, bipolar disorder, PVD and venous insufficiency with chronic BLE ulcer, headache and recent hospitalization from 12/27-1/1 due to 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  Patient returns to the hospital with generalized weakness.  Also concerned about therapy and wound care staff not visiting lately.  Patient was discharged with a plan to have heart monitor.   On arrival to ED, vital stable except for mild tachycardia.  On room air.  Glucose 52.  CT head showed evolving subacute right insular cortex and temporal operculum infarct.  Troponin 185> 195.  CXR suggested bronchitis/possible bronchiolitis.  Influenza, COVID-19 and RSV PCR nonreactive.  UA with nitrite and many bacteria.  Patient was given full dose aspirin.  Cardiology consulted.  Started on IV ceftriaxone for possible UTI.  She was seen by cardiology, and loop recorder was implanted.     Generalized weakness:  Due to hypoglycemia?  BMP glucose was 52 on arrival.  Patient is on insulin and glipizide which increases her risk of hypoglycemia.  CT head suggested subacute right-sided MCA.  UA with nitrite and many bacteria .  She is currently back to baseline. 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  CT head consistent with a recent CVA.  No acute neuro finding other than residual left hemiparesis.  Patient does  support to be set up with a Zio patch that has now completed. - loop recorder implanted by cardiology 06/26/2022 -Continue aspirin and Plavix for a total of 3 weeks followed by aspirin alone as previously planned -Not on statin with LDL of 45.   Elevated troponin: Likely demand ischemia.  No significant delta.  Patient without chest pain.  TTE without significant finding.  Already on DAPT after recent CVA. -Continue DAPT -Cardiology signed off.   Uncontrolled IDDM-2 with hyperglycemia:  A1c 9.5% on 06/11/2022.  Initial glucose 52.  Patient is on glipizide and insulin which could increase the risk of hypoglycemia. -Glipizide has been discontinued on discharge, and her Lantus has been decreased to 30 units. Last Labs         Recent Labs  Lab 06/26/22 0924 06/26/22 1229 06/26/22 2110 06/27/22 0557 06/27/22 1135  GLUCAP 143* 116* 262* 132* 139*      UTI  - treated with Rocephin x 3 days   Hypokalemia: Resolved.     Essential hypertension normotensive for most part. -Continue home regimen-atenolol and nifedipine.   Chronic bilateral lower extremity wounds Patient has chronic wounds of the lower extremities.  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  Good insight.  "Issues with short-term memory per husband". -Continue Aricept   History of PVD/venous stasis ulcers: No signs of infection. -Continue wound care   History of bipolar disorder: Stable. -Continue home Latuda   Gout -Continue allopurinol   Morbid obesity Body mass index is 46 kg/m.  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     Disposition Home   Diet: Heart Healthy/carb modified   On your next visit with your primary care physician please Get Medicines reviewed and adjusted.   Please request your Prim.MD to go over all Hospital Tests and Procedure/Radiological results at the follow up, please get all Hospital records sent to your Prim MD by signing hospital release before you go home.   If you experience worsening of your admission symptoms, develop shortness of breath, life threatening emergency, suicidal or homicidal thoughts you must seek medical attention immediately by calling 911 or calling your MD immediately  if symptoms less severe.  You Must read complete instructions/literature along with all the possible adverse reactions/side effects for all the Medicines you take and that have been prescribed to you. Take any new Medicines after you have completely understood and accpet all the possible adverse reactions/side effects.   Do not drive, operating heavy machinery, perform activities at heights, swimming or participation in water activities or provide baby sitting services if your were admitted for syncope or siezures until you have seen by Primary MD or a Neurologist and advised to do so again.  Do not drive when taking Pain medications.    Do not take more than prescribed Pain, Sleep and Anxiety Medications  Special Instructions: If you have smoked or chewed Tobacco  in the last 2 yrs please stop smoking, stop any regular Alcohol  and or any Recreational drug use.  Wear Seat belts while driving.   Please note  You were cared for  by a hospitalist during your hospital stay. If you have any questions about your discharge medications or the care you received while you were in the hospital after you are discharged, you can call the unit and asked to speak with the hospitalist on call if the hospitalist that took care of you is not available. Once you are discharged, your primary care physician will handle any further medical issues. Please note that NO REFILLS for any discharge medications will be authorized once you are discharged, as it is imperative that you return to your primary care physician (or establish a relationship with a primary care physician if you do not have one) for your aftercare needs so that they can reassess your need for medications and monitor your lab values.   Discharge wound care:   Complete by: As directed    Comments: Dressing procedure/placement/frequency: Once stable, bedside RN to cleanse bilateral legs with soap and water and pat dry. Apply Medihoney to open wounds.  Cover with gauze and ABD pad.  Wrap both legs with gauze from below toes to below knee and secure with ace wrap for modified light compression. Change  daily. Please continue to keep the wounds clean and dry   Increase activity slowly   Complete by: As directed       Allergies as of 06/28/2022       Reactions   Ace Inhibitors Swelling   Latex Itching, Swelling   Tizanidine Other (See Comments)   Tremors    Ultram [tramadol] Nausea And Vomiting   Diprivan [propofol] Itching   Metformin And Related Other (See Comments)   Tremors  Chills        Medication List     STOP taking these medications    glipiZIDE 10 MG tablet Commonly known as: GLUCOTROL       TAKE these medications    acetaminophen 325 MG tablet Commonly known as: TYLENOL Take 2 tablets (650 mg total) by mouth every 6 (six) hours as needed for mild pain (or Fever >/= 101). What changed:  medication strength how much to take when to take this reasons  to take this   aspirin EC 81 MG tablet Take 81 mg by mouth daily. Swallow whole.   atenolol 25 MG tablet Commonly known as: TENORMIN Take 25-50 mg by mouth See admin instructions. 50 mg every morning, 25 mg at bedtime   clopidogrel 75 MG tablet Commonly known as: PLAVIX Take 1 tablet (75 mg total) by mouth daily for 8 days. Please stop on 07/06/2022 What changed: additional instructions   cyclobenzaprine 5 MG tablet Commonly known as: FLEXERIL Take 5 mg by mouth 3 (three) times daily as needed for muscle spasms.   donepezil 10 MG tablet Commonly known as: ARICEPT Take 10 mg by mouth in the morning.   gabapentin 300 MG capsule Commonly known as: NEURONTIN Take 2 capsules (600 mg total) by mouth 2 (two) times daily. What changed: how much to take   insulin glargine 100 UNIT/ML Solostar Pen Commonly known as: LANTUS Inject 30 Units into the skin every morning. And pen needles 1/day What changed: how much to take   lurasidone 40 MG Tabs tablet Commonly known as: Latuda Take 1 tablet (40 mg total) by mouth daily with breakfast.   NIFEdipine 90 MG 24 hr tablet Commonly known as: PROCARDIA XL/NIFEDICAL-XL Take 90 mg by mouth in the morning.   traZODone 50 MG tablet Commonly known as: DESYREL Take 1 tablet (50 mg total) by mouth at bedtime and may repeat dose one time if needed.               Discharge Care Instructions  (From admission, onward)           Start     Ordered   06/28/22 0000  Discharge wound care:       Comments: Comments: Dressing procedure/placement/frequency: Once stable, bedside RN to cleanse bilateral legs with soap and water and pat dry. Apply Medihoney to open wounds.  Cover with gauze and ABD pad.  Wrap both legs with gauze from below toes to below knee and secure with ace wrap for modified light compression. Change daily. Please continue to keep the wounds clean and dry   06/28/22 0848            Follow-up Information     [name] Cork, PA-C Follow up on 07/03/2022.   Specialty: Physician Assistant Why: @10 : information: [address], [address] [place] [phone]         Care, [facility] Follow up.   Why: for home health services Contact information: [address] [place] [address] [phone]                Allergies  Allergen Reactions   Ace Inhibitors Swelling   Latex Itching and Swelling   Tizanidine Other (See Comments)    Tremors    Ultram [Tramadol] Nausea And Vomiting   Diprivan [Propofol] Itching   Metformin And Related Other (See Comments)    Tremors  Chills    Consultations: Cardiology EP   Procedures/Studies: EP PPM/ICD IMPLANT  Result Date: 06/26/2022 SURGEON:  Will Jorja Loa, MD   PREPROCEDURE DIAGNOSIS:  Cryptogenic stroke   POSTPROCEDURE DIAGNOSIS: Cryptogenic stroke    PROCEDURES:  1. Implantable loop recorder implantation   INTRODUCTION:  [name] presents with a history of cryptogenic stroke The costs of loop recorder monitoring have been discussed with the patient.   DESCRIPTION OF PROCEDURE:  Informed written consent was obtained.  The patient required no sedation for the procedure today.  Mapping over the patient's chest was performed to identify the area where electrograms were most prominent for ILR recording.  This area was found to be the left parasternal region over the 4th intercostal space. The patients left chest was therefore prepped and draped in the usual sterile fashion. The skin overlying the left parasternal region was infiltrated with lidocaine for local analgesia.  A 0.5-cm incision was made over the left parasternal region over the 3rd intercostal space.  A subcutaneous ILR pocket was fashioned using a combination of sharp and blunt dissection.  A Abbott Assert IQ EL+ (serial # [id number]) implantable loop recorder was then placed into the pocket  R waves were very prominent and measured 0.40mV.   Steri- Strips and a sterile dressing were then applied.  There were no early apparent complications.   CONCLUSIONS:  1. Successful implantation of a implantable loop recorder for a history of cryptogenic stroke  2. No early apparent complications. Will Jorja Loa, MD 06/26/2022 4:34 PM   ECHOCARDIOGRAM COMPLETE  Result Date: 06/25/2022    ECHOCARDIOGRAM REPORT   Patient Name:   [name] Date of Exam: 06/25/2022 Medical Rec #:  [id number]       Height:       65.0 in Accession #:    [id number]      Weight:       283.1 lb Date of Birth:  1950-10-08      BSA:          2.293 m Patient Age:    71 years        BP:           117/104 mmHg Patient Gender: F               HR:           68 bpm. Exam Location:  Inpatient Procedure: 2D Echo, Cardiac Doppler and Color Doppler Indications:    Elevated Troponin  History:        Patient has prior history of Echocardiogram examinations, most                 recent 06/11/2022. TIA and Stroke; Risk Factors:Dyslipidemia,                 Hypertension and Diabetes.  Sonographer:    Lucendia Herrlich Referring Phys: [id number] RONDELL A SMITH IMPRESSIONS  1. Left ventricular ejection fraction, by estimation, is 65 to 70%. The left ventricle has normal function. The left ventricle has no regional wall motion abnormalities. There is  moderate left ventricular hypertrophy. Left ventricular diastolic parameters are consistent with Grade I diastolic dysfunction (impaired relaxation).  2. Right ventricular systolic function is normal. The right ventricular size is normal. There is normal pulmonary artery systolic pressure. The estimated right ventricular systolic pressure is 24.3 mmHg.  3. The mitral valve is normal in structure. Trivial mitral valve regurgitation. No evidence of mitral stenosis.  4. The aortic valve was not well visualized. There is mild calcification of the aortic valve. Aortic valve regurgitation is not visualized. No aortic stenosis is present.  5. Increased flow  velocities may be secondary to anemia, thyrotoxicosis, hyperdynamic or high flow state.  6. The inferior vena cava is normal in size with greater than 50% respiratory variability, suggesting right atrial pressure of 3 mmHg.  7. Cannot exclude a small PFO. FINDINGS  Left Ventricle: Left ventricular ejection fraction, by estimation, is 65 to 70%. The left ventricle has normal function. The left ventricle has no regional wall motion abnormalities. The left ventricular internal cavity size was normal in size. There is  moderate left ventricular hypertrophy. Left ventricular diastolic parameters are consistent with Grade I diastolic dysfunction (impaired relaxation). Right Ventricle: The right ventricular size is normal. No increase in right ventricular wall thickness. Right ventricular systolic function is normal. There is normal pulmonary artery systolic pressure. The tricuspid regurgitant velocity is 2.31 m/s, and  with an assumed right atrial pressure of 3 mmHg, the estimated right ventricular systolic pressure is 24.3 mmHg. Left Atrium: Left atrial size was normal in size. Right Atrium: Right atrial size was normal in size. Pericardium: There is no evidence of pericardial effusion. Presence of epicardial fat layer. Mitral Valve: The mitral valve is normal in structure. Trivial mitral valve regurgitation. No evidence of mitral valve stenosis. Tricuspid Valve: The tricuspid valve is normal in structure. Tricuspid valve regurgitation is trivial. No evidence of tricuspid stenosis. Aortic Valve: The aortic valve was not well visualized. There is mild calcification of the aortic valve. Aortic valve regurgitation is not visualized. No aortic stenosis is present. Aortic valve mean gradient measures 9.0 mmHg. Aortic valve peak gradient  measures 15.5 mmHg. Aortic valve area, by VTI measures 2.46 cm. Pulmonic Valve: The pulmonic valve was normal in structure. Pulmonic valve regurgitation is trivial. No evidence of pulmonic  stenosis. Aorta: The aortic root is normal in size and structure. Venous: The inferior vena cava is normal in size with greater than 50% respiratory variability, suggesting right atrial pressure of 3 mmHg. IAS/Shunts: Cannot exclude a small PFO.  LEFT VENTRICLE PLAX 2D LVIDd:         4.20 cm   Diastology LVIDs:         3.10 cm   LV e' medial:    5.98 cm/s LV PW:         0.90 cm   LV E/e' medial:  14.8 LV IVS:        1.30 cm   LV e' lateral:   6.96 cm/s LVOT diam:     2.20 cm   LV E/e' lateral: 12.8 LV SV:         97 LV SV Index:   42 LVOT Area:     3.80 cm  RIGHT VENTRICLE             IVC RV S prime:     22.00 cm/s  IVC diam: 1.30 cm TAPSE (M-mode): 2.4 cm LEFT ATRIUM             Index  RIGHT ATRIUM           Index LA diam:        4.10 cm 1.79 cm/m   RA Area:     12.50 cm LA Vol (A2C):   49.8 ml 21.72 ml/m  RA Volume:   28.10 ml  12.26 ml/m LA Vol (A4C):   47.6 ml 20.76 ml/m LA Biplane Vol: 51.7 ml 22.55 ml/m  AORTIC VALVE AV Area (Vmax):    2.42 cm AV Area (Vmean):   2.35 cm AV Area (VTI):     2.46 cm AV Vmax:           197.00 cm/s AV Vmean:          139.000 cm/s AV VTI:            0.394 m AV Peak Grad:      15.5 mmHg AV Mean Grad:      9.0 mmHg LVOT Vmax:         125.33 cm/s LVOT Vmean:        85.867 cm/s LVOT VTI:          0.255 m LVOT/AV VTI ratio: 0.65  AORTA Ao Root diam: 2.60 cm Ao Asc diam:  3.50 cm MITRAL VALVE                TRICUSPID VALVE MV Area (PHT): 3.03 cm     TR Peak grad:   21.3 mmHg MV Decel Time: 250 msec     TR Vmax:        231.00 cm/s MV E velocity: 88.80 cm/s MV A velocity: 132.00 cm/s  SHUNTS MV E/A ratio:  0.67         Systemic VTI:  0.26 m                             Systemic Diam: 2.20 cm Weston Brass MD Electronically signed by Weston Brass MD Signature Date/Time: 06/25/2022/8:03:45 PM    Final    CT HEAD WO CONTRAST ( )  Result Date: 06/25/2022 CLINICAL DATA:  generalized weakness, recent stroke EXAM: CT HEAD WITHOUT CONTRAST TECHNIQUE: Contiguous axial  images were obtained from the base of the skull through the vertex without intravenous contrast. RADIATION DOSE REDUCTION: This exam was performed according to the departmental dose-optimization program which includes automated exposure control, adjustment of the mA and/or kV according to patient size and/or use of iterative reconstruction technique. COMPARISON:  06/11/2022 FINDINGS: Brain: Normal anatomic configuration. Parenchymal volume loss is commensurate with the patient's age. Mild periventricular white matter changes are present likely reflecting the sequela of small vessel ischemia. Previously noted region of hypodensity involving the right insular cortex and temporal operculum has largely resolved with a few small regions of cortical hypodensity within the insular cortex in keeping with subacute infarct. No abnormal intra or extra-axial mass lesion or fluid collection. No abnormal mass effect or midline shift. No evidence of acute intracranial hemorrhage or infarct. Ventricular size is normal. Cerebellum unremarkable. Vascular: No asymmetric hyperdense vasculature at the skull base. Skull: Intact Sinuses/Orbits: Paranasal sinuses are clear. Orbits are unremarkable. Other: Mastoid air cells and middle ear cavities are clear. IMPRESSION: 1. No acute intracranial hemorrhage or infarct. 2. 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 3. Mild senescent change. Electronically Signed   By: Helyn Numbers M.D.   On: 06/25/2022 04:20   DG Chest 2 View  Result Date: 06/25/2022 CLINICAL DATA:  Generalized weakness. EXAM: CHEST - 2 VIEW COMPARISON:  Portable chest 06/21/2019, CTA chest 06/22/2019 preop FINDINGS: Limited exam due to low inspiratory effort and habitus. There is central bronchial thickening consistent with bronchitis and increased perihilar interstitial lung markings which may suggest viral bronchiolitis. Other interstitial pneumonitis is possible. Findings  could also be due to low lung volumes. The peripheral lungs are clear. The sulci are sharp. Heart size and vasculature are normal with unremarkable mediastinum. Thoracic spondylosis. Follow-up study is recommended in full inspiration. IMPRESSION: There is central bronchial thickening consistent with bronchitis and increased perihilar interstitial lung markings which may suggest viral bronchiolitis. Other interstitial pneumonitis is possible. Findings could also be due to low lung volumes. A follow-up study is recommended with PA and lateral views in full inspiration. Electronically Signed   By: Almira Bar M.D.   On: 06/25/2022 03:59   IR PERCUTANEOUS ART THROMBECTOMY/INFUSION INTRACRANIAL INC DIAG ANGIO  Result Date: 06/19/2022 INDICATION: New onset left-sided weakness, left-sided neglect and right-sided gaze deviation. Occluded codominant superior division of the right middle cerebral artery on CT angiogram of the head and neck. EXAM: 1. EMERGENT LARGE VESSEL OCCLUSION THROMBOLYSIS (anterior circulation) COMPARISON:  CT angiogram of the head and neck of June 11, 2022. MEDICATIONS: Ancef 2 g IV antibiotic was administered within 1 hour of the procedure. ANESTHESIA/SEDATION: General anesthesia. CONTRAST:  Omnipaque 300 70 cc. FLUOROSCOPY TIME:  Fluoroscopy Time: 32 minutes 6 seconds (1071 mGy). COMPLICATIONS: None immediate. TECHNIQUE: Following a full explanation of the procedure along with the potential associated complications, an informed witnessed consent was obtained. The risks of intracranial hemorrhage of 10%, worsening neurological deficit, ventilator dependency, death and inability to revascularize were all reviewed in detail with the patient's son. The patient was then put under general anesthesia by the Department of Anesthesiology at [facility]. The right forearm to the wrist was prepped and draped in the usual sterile manner. The right radial artery was then identified with  ultrasound, and its morphology documented in the radiology PACS system. A dorsal palmar anastomosis was verified to be present. Using ultrasound guidance access into the right radial artery was obtained with a 6/7 French radial sheath over an 018 inch micro guidewire. The micro guidewire, and the obturator were removed. Good aspiration obtained from the side port of the radial sheath. A cocktail of 2000 units of heparin, 2.5 mg of verapamil, and 200 mcg of nitroglycerin was then infused through the sheath without event. A right radial arteriogram was performed. Over an 035 inch glidewire, a 5 French diagnostic Simmons 2 catheter was advanced to the aortic arch region and advanced into the distal right common carotid artery followed by the catheter. The guidewire was removed. Good aspiration obtained from the diagnostic catheter. A gentle contrast injection demonstrated no evidence of spasms, dissections or of intraluminal filling defects. An arteriogram was then performed at the common carotid bifurcation. FINDINGS: The right common carotid bifurcation demonstrates the right external carotid artery and its major branches to be widely patent. The right internal carotid artery at the bulb to the cranial skull base is widely patent. The petrous, the cavernous and the supraclinoid right ICA demonstrate wide patency. The right anterior cerebral artery opacifies into the capillary and venous phases. The right middle cerebral artery demonstrates wide patency of the inferior division. The co-dominant superior division demonstrates angiographic occlusion of the mid M2 segment. PROCEDURE: The diagnostic Simmons 2 catheter in the right common carotid artery was then exchanged over a 0.035 inch 300  cm Pollyann Kennedy exchange guidewire for a 95 cm 071 Benchmark guide catheter was then advanced to the proximal right ICA. The guidewire was removed. Good aspiration obtained from hub of the Benchmark guide catheter. Through this, and also  over a 0.035 inch Roadrunner guidewire, a 120 cm 5 Chad guide catheter was advanced over an 014 inch standard Aristotle micro guidewire with a moderate J configuration inside of a 162 microcatheter to the supraclinoid right ICA. The Benchmark guide catheter was advanced to the distal right internal carotid artery skull base. Using a torque device, the micro guidewire was advanced through the occluded co-dominant guidewire superior division M2 M3 region followed by the microcatheter. The guidewire was removed. Good aspiration obtained from hub of the microcatheter which was then connected to continuous heparinized saline infusion. A 4 mm x 40 mm Solitaire X retrieval device was then deployed in the usual manner such that the proximal portion of the retrieval was just proximal to the occluding thrombus. The 5 Chad guide catheter was then advanced to be just abutting the clot. Aspiration was then applied at the hub of the [place] guide catheter and also the Benchmark guide catheter with a 20 mL syringe for 2 minutes. Thereafter, the combination of the retriever, the microcatheter and the [place] guide catheter were retrieved and removed. A control arteriogram performed through the Benchmark demonstrated no significant change in the occluded superior division. A second pass was then made again using the above combination but this time, after having ascertained safe positioning of the tip of the microcatheter a 3 mm x 20 mm Solitaire X retrieval device was deployed as described above with the [place] guide catheter imbedded in the proximal portion of the clot. Again aspiration was applied at the hub of the [place] guide catheter using a Penumbra aspiration device and also at the hub of the Benchmark guide catheter with a 20 mL syringe for approximately 2 minutes. The combination was then retrieved and removed. A control arteriogram performed through the Benchmark guide catheter now demonstrated partial  revascularization of a superior branch of the superior division of the right MCA. A third pass was then made time using a Vecta 46 146 cm aspiration catheter which was advanced with an 021 162 cm microcatheter over an 014 inch standard Synchro micro guidewire. The micro guidewire and microcatheter were advanced into the M2 M3 region followed by the microcatheter. The aspiration catheter was engaged at the bifurcation of branches site of the occlusion. The micro guidewire, and the microcatheter were retrieved and removed. Contact aspiration was applied for approximately 2 minutes via a patent Penumbra aspiration device. The aspiration catheter was removed. A control arteriogram performed through the Benchmark guide catheter demonstrated now complete revascularization of the superior division of the right middle cerebral artery. The inferior division remained widely patent. A TICI 3 revascularization was retrieved. A final control arteriogram performed through the Benchmark guide catheter in the proximal right common carotid artery demonstrated patency of the right internal carotid artery extra cranially and intracranially with wide patency of the right MCA and right ACA branches. A flat panel CT of the brain demonstrated minimal hyperattenuation in the distal right perisylvian area. No mass effect or midline shift was evident. A wrist band was applied at the right radial puncture site. The right radial artery was verified to be present. The patient's general anesthesia was reversed and the patient was extubated. Patient was slow in recovering to where she was moving her right side spontaneously  and to simple commands. She was beginning to make some assessable conversation. She was then transferred to the neuro ICU for post revascularization care. IMPRESSION: IMPRESSION Status post endovascular complete revascularization of occluded superior division of the right middle cerebral artery with 2 passes with contact  aspiration and stent retriever, 1 pass with contact aspiration achieving a TICI 3 revascularization. PLAN: As per referring MD. Electronically Signed   By: Julieanne Cotton M.D.   On: 06/19/2022 08:22   IR US Guide Vasc Access Right  Result Date: 06/19/2022 INDICATION: New onset left-sided weakness, left-sided neglect and right-sided gaze deviation. Occluded codominant superior division of the right middle cerebral artery on CT angiogram of the head and neck. EXAM: 1. EMERGENT LARGE VESSEL OCCLUSION THROMBOLYSIS (anterior circulation) COMPARISON:  CT angiogram of the head and neck of June 11, 2022. MEDICATIONS: Ancef 2 g IV antibiotic was administered within 1 hour of the procedure. ANESTHESIA/SEDATION: General anesthesia. CONTRAST:  Omnipaque 300 70 cc. FLUOROSCOPY TIME:  Fluoroscopy Time: 32 minutes 6 seconds (1071 mGy). COMPLICATIONS: None immediate. TECHNIQUE: Following a full explanation of the procedure along with the potential associated complications, an informed witnessed consent was obtained. The risks of intracranial hemorrhage of 10%, worsening neurological deficit, ventilator dependency, death and inability to revascularize were all reviewed in detail with the patient's son. The patient was then put under general anesthesia by the Department of Anesthesiology at [facility]. The right forearm to the wrist was prepped and draped in the usual sterile manner. The right radial artery was then identified with ultrasound, and its morphology documented in the radiology PACS system. A dorsal palmar anastomosis was verified to be present. Using ultrasound guidance access into the right radial artery was obtained with a 6/7 French radial sheath over an 018 inch micro guidewire. The micro guidewire, and the obturator were removed. Good aspiration obtained from the side port of the radial sheath. A cocktail of 2000 units of heparin, 2.5 mg of verapamil, and 200 mcg of nitroglycerin was then infused  through the sheath without event. A right radial arteriogram was performed. Over an 035 inch glidewire, a 5 French diagnostic Simmons 2 catheter was advanced to the aortic arch region and advanced into the distal right common carotid artery followed by the catheter. The guidewire was removed. Good aspiration obtained from the diagnostic catheter. A gentle contrast injection demonstrated no evidence of spasms, dissections or of intraluminal filling defects. An arteriogram was then performed at the common carotid bifurcation. FINDINGS: The right common carotid bifurcation demonstrates the right external carotid artery and its major branches to be widely patent. The right internal carotid artery at the bulb to the cranial skull base is widely patent. The petrous, the cavernous and the supraclinoid right ICA demonstrate wide patency. The right anterior cerebral artery opacifies into the capillary and venous phases. The right middle cerebral artery demonstrates wide patency of the inferior division. The co-dominant superior division demonstrates angiographic occlusion of the mid M2 segment. PROCEDURE: The diagnostic Simmons 2 catheter in the right common carotid artery was then exchanged over a 0.035 inch 300 cm Rosen exchange guidewire for a 95 cm 071 Benchmark guide catheter was then advanced to the proximal right ICA. The guidewire was removed. Good aspiration obtained from hub of the Benchmark guide catheter. Through this, and also over a 0.035 inch Road[place] guidewire, a 120 cm 5 [place] guide catheter was advanced over an 014 inch standard Aristotle micro guidewire with a moderate J configuration inside of a  162 microcatheter to the supraclinoid right ICA. The Benchmark guide catheter was advanced to the distal right internal carotid artery skull base. Using a torque device, the micro guidewire was advanced through the occluded co-dominant guidewire superior division M2 M3 region followed by the  microcatheter. The guidewire was removed. Good aspiration obtained from hub of the microcatheter which was then connected to continuous heparinized saline infusion. A 4 mm x 40 mm Solitaire X retrieval device was then deployed in the usual manner such that the proximal portion of the retrieval was just proximal to the occluding thrombus. The 5 [place] guide catheter was then advanced to be just abutting the clot. Aspiration was then applied at the hub of the [place] guide catheter and also the Benchmark guide catheter with a 20 mL syringe for 2 minutes. Thereafter, the combination of the retriever, the microcatheter and the [place] guide catheter were retrieved and removed. A control arteriogram performed through the Benchmark demonstrated no significant change in the occluded superior division. A second pass was then made again using the above combination but this time, after having ascertained safe positioning of the tip of the microcatheter a 3 mm x 20 mm Solitaire X retrieval device was deployed as described above with the [place] guide catheter imbedded in the proximal portion of the clot. Again aspiration was applied at the hub of the [place] guide catheter using a Penumbra aspiration device and also at the hub of the Benchmark guide catheter with a 20 mL syringe for approximately 2 minutes. The combination was then retrieved and removed. A control arteriogram performed through the Benchmark guide catheter now demonstrated partial revascularization of a superior branch of the superior division of the right MCA. A third pass was then made time using a Vecta 46 146 cm aspiration catheter which was advanced with an 021 162 cm microcatheter over an 014 inch standard Synchro micro guidewire. The micro guidewire and microcatheter were advanced into the M2 M3 region followed by the microcatheter. The aspiration catheter was engaged at the bifurcation of branches site of the occlusion. The micro guidewire, and the  microcatheter were retrieved and removed. Contact aspiration was applied for approximately 2 minutes via a patent Penumbra aspiration device. The aspiration catheter was removed. A control arteriogram performed through the Benchmark guide catheter demonstrated now complete revascularization of the superior division of the right middle cerebral artery. The inferior division remained widely patent. A TICI 3 revascularization was retrieved. A final control arteriogram performed through the Benchmark guide catheter in the proximal right common carotid artery demonstrated patency of the right internal carotid artery extra cranially and intracranially with wide patency of the right MCA and right ACA branches. A flat panel CT of the brain demonstrated minimal hyperattenuation in the distal right perisylvian area. No mass effect or midline shift was evident. A wrist band was applied at the right radial puncture site. The right radial artery was verified to be present. The patient's general anesthesia was reversed and the patient was extubated. Patient was slow in recovering to where she was moving her right side spontaneously and to simple commands. She was beginning to make some assessable conversation. She was then transferred to the neuro ICU for post revascularization care. IMPRESSION: IMPRESSION Status post endovascular complete revascularization of occluded superior division of the right middle cerebral artery with 2 passes with contact aspiration and stent retriever, 1 pass with contact aspiration achieving a TICI 3 revascularization. PLAN: As per referring MD. Electronically Signed   By:  Julieanne Cotton M.D.   On: 06/19/2022 08:22   IR CT Head Ltd  Result Date: 06/19/2022 INDICATION: New onset left-sided weakness, left-sided neglect and right-sided gaze deviation. Occluded codominant superior division of the right middle cerebral artery on CT angiogram of the head and neck. EXAM: 1. EMERGENT LARGE VESSEL  OCCLUSION THROMBOLYSIS (anterior circulation) COMPARISON:  CT angiogram of the head and neck of June 11, 2022. MEDICATIONS: Ancef 2 g IV antibiotic was administered within 1 hour of the procedure. ANESTHESIA/SEDATION: General anesthesia. CONTRAST:  Omnipaque 300 70 cc. FLUOROSCOPY TIME:  Fluoroscopy Time: 32 minutes 6 seconds (1071 mGy). COMPLICATIONS: None immediate. TECHNIQUE: Following a full explanation of the procedure along with the potential associated complications, an informed witnessed consent was obtained. The risks of intracranial hemorrhage of 10%, worsening neurological deficit, ventilator dependency, death and inability to revascularize were all reviewed in detail with the patient's son. The patient was then put under general anesthesia by the Department of Anesthesiology at [facility]. The right forearm to the wrist was prepped and draped in the usual sterile manner. The right radial artery was then identified with ultrasound, and its morphology documented in the radiology PACS system. A dorsal palmar anastomosis was verified to be present. Using ultrasound guidance access into the right radial artery was obtained with a 6/7 French radial sheath over an 018 inch micro guidewire. The micro guidewire, and the obturator were removed. Good aspiration obtained from the side port of the radial sheath. A cocktail of 2000 units of heparin, 2.5 mg of verapamil, and 200 mcg of nitroglycerin was then infused through the sheath without event. A right radial arteriogram was performed. Over an 035 inch glidewire, a 5 French diagnostic Simmons 2 catheter was advanced to the aortic arch region and advanced into the distal right common carotid artery followed by the catheter. The guidewire was removed. Good aspiration obtained from the diagnostic catheter. A gentle contrast injection demonstrated no evidence of spasms, dissections or of intraluminal filling defects. An arteriogram was then performed at  the common carotid bifurcation. FINDINGS: The right common carotid bifurcation demonstrates the right external carotid artery and its major branches to be widely patent. The right internal carotid artery at the bulb to the cranial skull base is widely patent. The petrous, the cavernous and the supraclinoid right ICA demonstrate wide patency. The right anterior cerebral artery opacifies into the capillary and venous phases. The right middle cerebral artery demonstrates wide patency of the inferior division. The co-dominant superior division demonstrates angiographic occlusion of the mid M2 segment. PROCEDURE: The diagnostic Simmons 2 catheter in the right common carotid artery was then exchanged over a 0.035 inch 300 cm Rosen exchange guidewire for a 95 cm 071 Benchmark guide catheter was then advanced to the proximal right ICA. The guidewire was removed. Good aspiration obtained from hub of the Benchmark guide catheter. Through this, and also over a 0.035 inch [place] guidewire, a 120 cm 5 Chad guide catheter was advanced over an 014 inch standard Aristotle micro guidewire with a moderate J configuration inside of a 162 microcatheter to the supraclinoid right ICA. The Benchmark guide catheter was advanced to the distal right internal carotid artery skull base. Using a torque device, the micro guidewire was advanced through the occluded co-dominant guidewire superior division M2 M3 region followed by the microcatheter. The guidewire was removed. Good aspiration obtained from hub of the microcatheter which was then connected to continuous heparinized saline infusion. A 4 mm x 40 mm  Solitaire X retrieval device was then deployed in the usual manner such that the proximal portion of the retrieval was just proximal to the occluding thrombus. The 5 Chad guide catheter was then advanced to be just abutting the clot. Aspiration was then applied at the hub of the [place] guide catheter and also the Benchmark  guide catheter with a 20 mL syringe for 2 minutes. Thereafter, the combination of the retriever, the microcatheter and the [place] guide catheter were retrieved and removed. A control arteriogram performed through the Benchmark demonstrated no significant change in the occluded superior division. A second pass was then made again using the above combination but this time, after having ascertained safe positioning of the tip of the microcatheter a 3 mm x 20 mm Solitaire X retrieval device was deployed as described above with the [place] guide catheter imbedded in the proximal portion of the clot. Again aspiration was applied at the hub of the [place] guide catheter using a Penumbra aspiration device and also at the hub of the Benchmark guide catheter with a 20 mL syringe for approximately 2 minutes. The combination was then retrieved and removed. A control arteriogram performed through the Benchmark guide catheter now demonstrated partial revascularization of a superior branch of the superior division of the right MCA. A third pass was then made time using a Vecta 46 146 cm aspiration catheter which was advanced with an 021 162 cm microcatheter over an 014 inch standard Synchro micro guidewire. The micro guidewire and microcatheter were advanced into the M2 M3 region followed by the microcatheter. The aspiration catheter was engaged at the bifurcation of branches site of the occlusion. The micro guidewire, and the microcatheter were retrieved and removed. Contact aspiration was applied for approximately 2 minutes via a patent Penumbra aspiration device. The aspiration catheter was removed. A control arteriogram performed through the Benchmark guide catheter demonstrated now complete revascularization of the superior division of the right middle cerebral artery. The inferior division remained widely patent. A TICI 3 revascularization was retrieved. A final control arteriogram performed through the Benchmark guide catheter  in the proximal right common carotid artery demonstrated patency of the right internal carotid artery extra cranially and intracranially with wide patency of the right MCA and right ACA branches. A flat panel CT of the brain demonstrated minimal hyperattenuation in the distal right perisylvian area. No mass effect or midline shift was evident. A wrist band was applied at the right radial puncture site. The right radial artery was verified to be present. The patient's general anesthesia was reversed and the patient was extubated. Patient was slow in recovering to where she was moving her right side spontaneously and to simple commands. She was beginning to make some assessable conversation. She was then transferred to the neuro ICU for post revascularization care. IMPRESSION: IMPRESSION Status post endovascular complete revascularization of occluded superior division of the right middle cerebral artery with 2 passes with contact aspiration and stent retriever, 1 pass with contact aspiration achieving a TICI 3 revascularization. PLAN: As per referring MD. Electronically Signed   By: Julieanne Cotton M.D.   On: 06/19/2022 08:22   VAS [place] LOWER EXTREMITY VENOUS (DVT)  Result Date: 06/12/2022  Lower Venous DVT Study Patient Name:  [name]  Date of Exam:   06/12/2022 Medical Rec #: [id number]        Accession #:    [id number] Date of Birth: 1951-02-25       Patient Gender: F Patient Age:  71 years Exam Location:  [facility] Procedure:      VAS [place] LOWER EXTREMITY VENOUS (DVT) Referring Phys: Scheryl Marten XU --------------------------------------------------------------------------------  Indications: Stroke.  Limitations: Body habitus and poor ultrasound/tissue interface. Comparison Study: 07/20/19 prior Performing Technologist: [place] Ponder RVS  Examination Guidelines: A complete evaluation includes B-mode imaging, spectral Doppler, color Doppler, and power Doppler as needed of all accessible portions  of each vessel. Bilateral testing is considered an integral part of a complete examination. Limited examinations for reoccurring indications may be performed as noted. The reflux portion of the exam is performed with the patient in reverse Trendelenburg.  +--------+---------------+---------+-----------+----------+--------------------+ RIGHT   CompressibilityPhasicitySpontaneityPropertiesThrombus Aging       +--------+---------------+---------+-----------+----------+--------------------+ CFV     Full           Yes      Yes                                       +--------+---------------+---------+-----------+----------+--------------------+ SFJ     Full                                                              +--------+---------------+---------+-----------+----------+--------------------+ FV Prox Full                                                              +--------+---------------+---------+-----------+----------+--------------------+ FV Mid                 Yes      Yes                                       +--------+---------------+---------+-----------+----------+--------------------+ FV                     Yes      Yes                                       Distal                                                                    +--------+---------------+---------+-----------+----------+--------------------+ PFV     Full                                                              +--------+---------------+---------+-----------+----------+--------------------+ POP     Full           Yes  Yes                                       +--------+---------------+---------+-----------+----------+--------------------+ PTV                    Yes      Yes                  patent by color                                                           doppler               +--------+---------------+---------+-----------+----------+--------------------+ PERO                                                 Not well visualized  +--------+---------------+---------+-----------+----------+--------------------+   +---------+---------------+---------+-----------+----------+-------------------+ LEFT     CompressibilityPhasicitySpontaneityPropertiesThrombus Aging      +---------+---------------+---------+-----------+----------+-------------------+ CFV      Full           Yes      Yes                                      +---------+---------------+---------+-----------+----------+-------------------+ SFJ      Full                                                             +---------+---------------+---------+-----------+----------+-------------------+ FV Prox  Full                                                             +---------+---------------+---------+-----------+----------+-------------------+ FV Mid                  Yes      Yes                                      +---------+---------------+---------+-----------+----------+-------------------+ FV Distal               Yes      Yes                                      +---------+---------------+---------+-----------+----------+-------------------+ PFV      Full                                                             +---------+---------------+---------+-----------+----------+-------------------+  POP      Full           Yes      Yes                                      +---------+---------------+---------+-----------+----------+-------------------+ PTV                                                   Not well visualized +---------+---------------+---------+-----------+----------+-------------------+ PERO                                                  Not well visualized +---------+---------------+---------+-----------+----------+-------------------+      Summary: RIGHT: - There is no evidence of deep vein thrombosis in the lower extremity. However, portions of this examination were limited- see technologist comments above.  - No cystic structure found in the popliteal fossa.  LEFT: - There is no evidence of deep vein thrombosis in the lower extremity. However, portions of this examination were limited- see technologist comments above.  - No cystic structure found in the popliteal fossa.  *See table(s) above for measurements and observations. Electronically signed by Harold Barban MD on 06/12/2022 at 5:20:30 PM.    Final    MR BRAIN WO CONTRAST  Result Date: 06/12/2022 CLINICAL DATA:  Follow-up examination for stroke. EXAM: MRI HEAD WITHOUT CONTRAST TECHNIQUE: Multiplanar, multiecho pulse sequences of the brain and surrounding structures were obtained without intravenous contrast. COMPARISON:  Prior studies from 06/11/2022. FINDINGS: Brain: Examination moderately degraded by motion artifact. Cerebral volume within normal limits. Scattered patchy T2/FLAIR hyperintensity involving the supratentorial cerebral white matter, nonspecific, but most commonly related to chronic microvascular ischemic disease. Mild patchy involvement of the pons. Appearance is mild for age. Restricted diffusion involving the anterior right frontal lobe, most pronounced at the right frontal operculum, consistent with acute right MCA distribution infarct. Associated petechial blood products without frank hemorrhagic transformation Heidelberg classification 1a: HI1, scattered small petechiae, no mass effect. No other evidence for acute or subacute ischemia. No other acute or chronic intracranial blood products. No mass lesion, midline shift or mass effect. No hydrocephalus or extra-axial fluid collection. Pituitary gland and suprasellar region within normal limits. Vascular: Major intracranial vascular flow voids are grossly maintained. Skull and upper cervical spine: Craniocervical junction  grossly within normal limits. Diffuse loss of normal bone marrow signal, nonspecific but can be seen with anemia, smoking, obesity, and infiltrative/myelofibrotic marrow processes. No scalp soft tissue abnormality. Sinuses/Orbits: Globes and orbital soft tissues grossly within normal limits. Paranasal sinuses are largely clear. Small bilateral mastoid effusions noted. Negative nasopharynx. Other: None. IMPRESSION: 1. Motion degraded exam. 2. 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 Associated petechial blood products without frank hemorrhagic transformation or significant mass effect. Heidelberg classification 1a: HI1, scattered small petechiae, no mass effect. 3. Underlying mild chronic microvascular ischemic disease. Electronically Signed   By: Jeannine Boga M.D.   On: 06/12/2022 02:54   CT ANGIO HEAD NECK W WO CM W PERF (CODE STROKE)  Addendum Date: 06/11/2022   ADDENDUM REPORT: 06/11/2022 20:25 ADDENDUM: CTA head and CT perfusion head results communicated  to Dr. Wilford Corner by telephone at 12:32 p.m. on 06/11/2022. Electronically Signed   By: Jackey Loge D.O.   On: 06/11/2022 20:25   Result Date: 06/11/2022 CLINICAL DATA:  Provided history: Neuro deficit, acute, stroke suspected. EXAM: CT ANGIOGRAPHY HEAD AND NECK CT PERFUSION BRAIN TECHNIQUE: Multidetector CT imaging of the head and neck was performed using the standard protocol during bolus administration of intravenous contrast. Multiplanar CT image reconstructions and MIPs were obtained to evaluate the vascular anatomy. Carotid stenosis measurements (when applicable) are obtained utilizing NASCET criteria, using the distal internal carotid diameter as the denominator. Multiphase CT imaging of the brain was performed following IV bolus contrast injection. Subsequent parametric perfusion maps were calculated using RAPID software. RADIATION DOSE REDUCTION: This exam was performed according to the departmental  dose-optimization program which includes automated exposure control, adjustment of the mA and/or kV according to patient size and/or use of iterative reconstruction technique. CONTRAST:  OMNIPAQUE IOHEXOL 350 MG/ML SOLN COMPARISON:  Non-contrast head CT performed earlier today 06/11/2022. FINDINGS: CTA NECK FINDINGS Aortic arch: Standard aortic branching. The visualized aortic arch is normal in caliber. Streak and beam hardening artifact arising from a dense right-sided contrast bolus partially obscures the right subclavian artery. Within this limitation, there is no appreciable hemodynamically significant innominate or proximal subclavian artery stenosis. Right carotid system: CCA and ICA patent within the neck without stenosis or significant atherosclerotic disease. No evidence of dissection. Left carotid system: CCA and ICA patent within the neck without stenosis or significant atherosclerotic disease. No evidence of dissection. Vertebral arteries: Vertebral arteries codominant and patent within the neck without stenosis or significant atherosclerotic disease. No evidence of dissection. Skeleton: Levocurvature of the cervical spine. Dextrocurvature of the thoracic spine, partially imaged. Cervical spondylosis. No acute fracture or aggressive osseous lesion. Other neck: No neck mass or cervical lymphadenopathy. Upper chest: No consolidation within the imaged lung apices. Review of the MIP images confirms the above findings CTA HEAD FINDINGS Anterior circulation: The intracranial internal carotid arteries are patent. Nonstenotic atherosclerotic plaque within both vessels. The M1 middle cerebral arteries are patent. Abrupt occlusion of a proximal M2 right MCA vessel (for instance as seen on series 15, image 20). No left M2 proximal branch occlusion or high-grade proximal stenosis is identified. The anterior cerebral arteries are patent. No intracranial aneurysm is identified. Posterior circulation: The  intracranial vertebral arteries are patent. The basilar artery is patent. The posterior cerebral arteries are patent. Posterior communicating arteries are present bilaterally. Venous sinuses: Within the limitations of contrast timing, no convincing thrombus. Anatomic variants: None significant. Review of the MIP images confirms the above findings CT Brain Perfusion Findings: CBF (<30%) Volume: 26mL (right MCA vascular territory) Perfusion (Tmax>6.0s) volume: 45mL (right MCA vascular territory) Mismatch Volume: 19mL Infarction Location:Right MCA vascular territory. Attempts are being made to reach the ordering provider at this time. IMPRESSION: CTA neck: The common carotid, internal carotid and vertebral arteries are patent within the neck without stenosis or significant atherosclerotic disease. No evidence of dissection. CTA head: Abrupt occlusion of a proximal M2 right middle cerebral artery vessel. CT perfusion head: The perfusion software identifies a 26 mL core infarct within the right MCA vascular territory. The perfusion software identifies a 45 mL region of critically hypoperfused parenchyma within the right MCA vascular territory (utilizing the Tmax>6 seconds threshold). Reported mismatch volume: 19 mL. Electronically Signed: By: Jackey Loge D.O. On: 06/11/2022 12:28   ECHOCARDIOGRAM COMPLETE  Result Date: 06/11/2022    ECHOCARDIOGRAM REPORT   Patient  Name:   [name] Date of Exam: 06/11/2022 Medical Rec #:  [id number]       Height:       65.0 in Accession #:    [id number]      Weight:       283.1 lb Date of Birth:  08/04/1950      BSA:          2.293 m Patient Age:    71 years        BP:           131/76 mmHg Patient Gender: F               HR:           82 bpm. Exam Location:  Inpatient Procedure: 2D Echo, Cardiac Doppler and Color Doppler Indications:    Stroke  History:        Patient has no prior history of Echocardiogram examinations.                 Risk Factors:Hypertension and Diabetes.   Sonographer:    Ross Ludwig RDCS (AE) Referring Phys: [id number] [facility]  Sonographer Comments: Patient is obese. IMPRESSIONS  1. Left ventricular ejection fraction, by estimation, is 65 to 70%. The left ventricle has normal function. The left ventricle has no regional wall motion abnormalities. There is mild concentric left ventricular hypertrophy. Left ventricular diastolic parameters were normal.  2. Right ventricular systolic function is normal. The right ventricular size is mildly enlarged. Tricuspid regurgitation signal is inadequate for assessing PA pressure.  3. Left atrial size was mildly dilated.  4. The mitral valve is grossly normal. No evidence of mitral valve regurgitation.  5. The aortic valve is tricuspid. There is mild calcification of the aortic valve. There is mild thickening of the aortic valve. Aortic valve regurgitation is not visualized. Aortic valve sclerosis/calcification is present, without any evidence of aortic stenosis.  6. Aortic dilatation noted. There is borderline dilatation of the ascending aorta, measuring 39 mm.  7. The inferior vena cava is normal in size with greater than 50% respiratory variability, suggesting right atrial pressure of 3 mmHg. Comparison(s): No prior Echocardiogram. FINDINGS  Left Ventricle: Left ventricular ejection fraction, by estimation, is 65 to 70%. The left ventricle has normal function. The left ventricle has no regional wall motion abnormalities. The left ventricular internal cavity size was normal in size. There is  mild concentric left ventricular hypertrophy. Left ventricular diastolic parameters were normal. Right Ventricle: The right ventricular size is mildly enlarged. No increase in right ventricular wall thickness. Right ventricular systolic function is normal. Tricuspid regurgitation signal is inadequate for assessing PA pressure. Left Atrium: Left atrial size was mildly dilated. Right Atrium: Right atrial size was normal in size.  Pericardium: There is no evidence of pericardial effusion. Mitral Valve: The mitral valve is grossly normal. There is mild thickening of the mitral valve leaflet(s). There is mild calcification of the mitral valve leaflet(s). Mild to moderate mitral annular calcification. No evidence of mitral valve regurgitation. Tricuspid Valve: The tricuspid valve is normal in structure. Tricuspid valve regurgitation is trivial. Aortic Valve: The aortic valve is tricuspid. There is mild calcification of the aortic valve. There is mild thickening of the aortic valve. Aortic valve regurgitation is not visualized. Aortic valve sclerosis/calcification is present, without any evidence of aortic stenosis. Aortic valve mean gradient measures 6.0 mmHg. Aortic valve peak gradient measures 11.3 mmHg. Aortic valve area, by VTI measures 2.75 cm. Pulmonic Valve:  The pulmonic valve was normal in structure. Pulmonic valve regurgitation is trivial. Aorta: Aortic dilatation noted and the aortic root is normal in size and structure. There is borderline dilatation of the ascending aorta, measuring 39 mm. Venous: The inferior vena cava is normal in size with greater than 50% respiratory variability, suggesting right atrial pressure of 3 mmHg. IAS/Shunts: The atrial septum is grossly normal.  LEFT VENTRICLE PLAX 2D LVIDd:         5.00 cm   Diastology LVIDs:         2.60 cm   LV e' medial:    7.51 cm/s LV PW:         0.90 cm   LV E/e' medial:  13.7 LV IVS:        0.90 cm   LV e' lateral:   9.90 cm/s LVOT diam:     2.20 cm   LV E/e' lateral: 10.4 LV SV:         91 LV SV Index:   40 LVOT Area:     3.80 cm  RIGHT VENTRICLE             IVC RV Basal diam:  3.60 cm     IVC diam: 1.30 cm RV Mid diam:    3.90 cm RV S prime:     17.20 cm/s TAPSE (M-mode): 3.1 cm LEFT ATRIUM             Index        RIGHT ATRIUM           Index LA diam:        4.10 cm 1.79 cm/m   RA Area:     12.60 cm LA Vol (A2C):   77.9 ml 33.98 ml/m  RA Volume:   27.00 ml  11.78 ml/m  LA Vol (A4C):   54.6 ml 23.81 ml/m LA Biplane Vol: 67.2 ml 29.31 ml/m  AORTIC VALVE AV Area (Vmax):    2.85 cm AV Area (Vmean):   2.73 cm AV Area (VTI):     2.75 cm AV Vmax:           168.00 cm/s AV Vmean:          113.000 cm/s AV VTI:            0.330 m AV Peak Grad:      11.3 mmHg AV Mean Grad:      6.0 mmHg LVOT Vmax:         126.00 cm/s LVOT Vmean:        81.200 cm/s LVOT VTI:          0.239 m LVOT/AV VTI ratio: 0.72  AORTA Ao Root diam: 2.60 cm Ao Asc diam:  3.90 cm MITRAL VALVE MV Area (PHT): 2.54 cm     SHUNTS MV Decel Time: 299 msec     Systemic VTI:  0.24 m MV E velocity: 103.00 cm/s  Systemic Diam: 2.20 cm MV A velocity: 124.00 cm/s MV E/A ratio:  0.83 Laurance Flatten MD Electronically signed by Laurance Flatten MD Signature Date/Time: 06/11/2022/5:36:33 PM    Final    CT HEAD CODE STROKE WO CONTRAST  Result Date: 06/11/2022 CLINICAL DATA:  Code stroke.  Neuro deficit, acute, stroke suspected EXAM: CT HEAD WITHOUT CONTRAST TECHNIQUE: Contiguous axial images were obtained from the base of the skull through the vertex without intravenous contrast. RADIATION DOSE REDUCTION: This exam was performed according to the departmental dose-optimization program which includes automated exposure control, adjustment  of the mA and/or kV according to patient size and/or use of iterative reconstruction technique. COMPARISON:  CT head 03/15/2020. FINDINGS: Brain: Asymmetric hypoattenuation and loss of gray differentiation involving the right insula, concerning for right MCA territory infarct. No evidence of acute hemorrhage, hydrocephalus, extra-axial collection or mass lesion/mass effect. Vascular: No definite hyperdense vessel identified. Skull: No acute fracture. Sinuses/Orbits: Clear sinuses.  No acute orbital findings. ASPECTS ([place] Stroke Program Early CT Score): 7 IMPRESSION: 1. Asymmetric hypoattenuation and loss of gray differentiation involving the right insula and overlying operculum, concerning  for acute right MCA territory infarct. Recommend MRI. 2. ASPECTS is 7. Code stroke imaging results were communicated on 06/11/2022 at 11:36 am to provider Dr. Wilford Corner via telephone, who verbally acknowledged these results. Electronically Signed   By: Feliberto Harts M.D.   On: 06/11/2022 11:36      Subjective:  No significant events overnight, she denies any complaints today, she is eager to go home. Discharge Exam: Vitals:   06/28/22 0418 06/28/22 0811  BP: 102/68 (!) 142/66  Pulse: 60 64  Resp: 20 20  Temp: 97.8 F (36.6 C)   SpO2: 98% 100%   Vitals:   06/27/22 1134 06/27/22 1920 06/28/22 0418 06/28/22 0811  BP: 128/61 133/84 102/68 (!) 142/66  Pulse: (!) 54 (!) 121 60 64  Resp: 15 18 20 20   Temp: 97.6 F (36.4 C) 98 F (36.7 C) 97.8 F (36.6 C)   TempSrc: Oral Axillary Oral   SpO2: 100% 96% 98% 100%  Weight:   125.6 kg     General: Pt is alert, awake, not in acute distress Cardiovascular: RRR, S1/S2 +, no rubs, no gallops Respiratory: CTA bilaterally, no wheezing, no rhonchi Abdominal: Soft, NT, ND, bowel sounds + Extremities: no edema, no cyanosis    The results of significant diagnostics from this hospitalization (including imaging, microbiology, ancillary and laboratory) are listed below for reference.     Microbiology: Recent Results (from the past 240 hour(s))  Blood culture (routine x 2)     Status: None (Preliminary result)   Collection Time: 06/25/22  3:29 AM   Specimen: BLOOD  Result Value Ref Range Status   Specimen Description BLOOD RIGHT ANTECUBITAL  Final   Special Requests AEROBIC BOTTLE ONLY Blood Culture adequate volume  Final   Culture   Final    NO GROWTH 3 DAYS Performed at [facility], [address], [place], [place] [address]    Report Status PENDING  Incomplete  Resp panel by RT-PCR (RSV, Flu A&B, Covid) Anterior Nasal Swab     Status: None   Collection Time: 06/25/22  4:29 AM   Specimen: Anterior Nasal Swab  Result Value Ref  Range Status   SARS Coronavirus 2 by RT PCR NEGATIVE NEGATIVE Final    Comment: (NOTE) SARS-CoV-2 target nucleic acids are NOT DETECTED.  The SARS-CoV-2 RNA is generally detectable in upper respiratory specimens during the acute phase of infection. The lowest concentration of SARS-CoV-2 viral copies this assay can detect is 138 copies/mL. A negative result does not preclude SARS-Cov-2 infection and should not be used as the sole basis for treatment or other patient management decisions. A negative result may occur with  improper specimen collection/handling, submission of specimen other than nasopharyngeal swab, presence of viral mutation(s) within the areas targeted by this assay, and inadequate number of viral copies(<138 copies/mL). A negative result must be combined with clinical observations, patient history, and epidemiological information. The expected result is Negative.  Fact  Sheet for Patients:  BloggerCourse.com  Fact Sheet for Healthcare Providers:  SeriousBroker.it  This test is no t yet approved or cleared by the [place] FDA and  has been authorized for detection and/or diagnosis of SARS-CoV-2 by FDA under an Emergency Use Authorization (EUA). This EUA will remain  in effect (meaning this test can be used) for the duration of the COVID-19 declaration under Section 564(b)(1) of the Act, 21 U.S.C.section 360bbb-3(b)(1), unless the authorization is terminated  or revoked sooner.       Influenza A by PCR NEGATIVE NEGATIVE Final   Influenza B by PCR NEGATIVE NEGATIVE Final    Comment: (NOTE) The Xpert Xpress SARS-CoV-2/FLU/RSV plus assay is intended as an aid in the diagnosis of influenza from Nasopharyngeal swab specimens and should not be used as a sole basis for treatment. Nasal washings and aspirates are unacceptable for Xpert Xpress SARS-CoV-2/FLU/RSV testing.  Fact Sheet for  Patients: BloggerCourse.com  Fact Sheet for Healthcare Providers: SeriousBroker.it  This test is not yet approved or cleared by the [place] FDA and has been authorized for detection and/or diagnosis of SARS-CoV-2 by FDA under an Emergency Use Authorization (EUA). This EUA will remain in effect (meaning this test can be used) for the duration of the COVID-19 declaration under Section 564(b)(1) of the Act, 21 U.S.C. section 360bbb-3(b)(1), unless the authorization is terminated or revoked.     Resp Syncytial Virus by PCR NEGATIVE NEGATIVE Final    Comment: (NOTE) Fact Sheet for Patients: BloggerCourse.com  Fact Sheet for Healthcare Providers: SeriousBroker.it  This test is not yet approved or cleared by the [place] FDA and has been authorized for detection and/or diagnosis of SARS-CoV-2 by FDA under an Emergency Use Authorization (EUA). This EUA will remain in effect (meaning this test can be used) for the duration of the COVID-19 declaration under Section 564(b)(1) of the Act, 21 U.S.C. section 360bbb-3(b)(1), unless the authorization is terminated or revoked.  Performed at [facility], [address] [place], [place] [address]   Blood culture (routine x 2)     Status: None (Preliminary result)   Collection Time: 06/25/22  6:27 AM   Specimen: BLOOD LEFT FOREARM  Result Value Ref Range Status   Specimen Description BLOOD LEFT FOREARM  Final   Special Requests   Final    BOTTLES DRAWN AEROBIC AND ANAEROBIC Blood Culture results may not be optimal due to an inadequate volume of blood received in culture bottles   Culture   Final    NO GROWTH 3 DAYS Performed at [facility], [address], [place], [place] [address]    Report Status PENDING  Incomplete  Urine Culture     Status: Abnormal   Collection Time: 06/25/22 12:30 PM   Specimen: Urine, Clean  Catch  Result Value Ref Range Status   Specimen Description URINE, CLEAN CATCH  Final   Special Requests   Final    NONE Performed at [facility], [address], [place], [place] [address]    Culture MULTIPLE SPECIES PRESENT, SUGGEST RECOLLECTION (A)  Final   Report Status 06/26/2022 FINAL  Final     Labs: BNP (last 3 results) No results for input(s): "BNP" in the last 8760 hours. Basic Metabolic Panel: Recent Labs  Lab 06/25/22 0329 06/25/22 0433 06/26/22 0043 06/27/22 0115  NA 137 140 138 136  K 3.4* 3.5 3.8 4.0  CL 107 108 109 103  CO2 20*  --  22 24  GLUCOSE 57* 52* 123* 227*  BUN 11 11 11  7*  CREATININE 0.86 0.80 0.95 0.77  CALCIUM 9.5  --  9.1 9.0  MG 1.9  --   --  2.1  PHOS  --   --   --  3.1   Liver Function Tests: Recent Labs  Lab 06/25/22 0329 06/27/22 0115  AST 15  --   ALT 14  --   ALKPHOS 109  --   BILITOT 0.5  --   PROT 8.5*  --   ALBUMIN 2.9* 2.4*   No results for input(s): "LIPASE", "AMYLASE" in the last 168 hours. No results for input(s): "AMMONIA" in the last 168 hours. CBC: Recent Labs  Lab 06/25/22 0329 06/25/22 0433 06/26/22 0043 06/27/22 0115  WBC 9.5  --  6.4 5.9  HGB 12.0 13.3 10.4* 11.5*  HCT 39.4 39.0 34.4* 37.2  MCV 86.8  --  86.9 86.7  PLT 464*  --  408* 405*   Cardiac Enzymes: No results for input(s): "CKTOTAL", "CKMB", "CKMBINDEX", "TROPONINI" in the last 168 hours. BNP: Invalid input(s): "POCBNP" CBG: Recent Labs  Lab 06/27/22 0557 06/27/22 1135 06/27/22 1634 06/27/22 2110 06/28/22 0609  GLUCAP 132* 139* 204* 178* 98   D-Dimer No results for input(s): "DDIMER" in the last 72 hours. Hgb A1c No results for input(s): "HGBA1C" in the last 72 hours. Lipid Profile No results for input(s): "CHOL", "HDL", "LDLCALC", "TRIG", "CHOLHDL", "LDLDIRECT" in the last 72 hours. Thyroid function studies No results for input(s): "TSH", "T4TOTAL", "T3FREE", "THYROIDAB" in the last 72 hours.  Invalid input(s):  "FREET3" Anemia work up No results for input(s): "VITAMINB12", "FOLATE", "FERRITIN", "TIBC", "IRON", "RETICCTPCT" in the last 72 hours. Urinalysis    Component Value Date/Time   COLORURINE YELLOW 06/25/2022 1230   APPEARANCEUR CLEAR 06/25/2022 1230   LABSPEC 1.005 06/25/2022 1230   PHURINE 7.0 06/25/2022 1230   GLUCOSEU NEGATIVE 06/25/2022 1230   HGBUR NEGATIVE 06/25/2022 1230   BILIRUBINUR NEGATIVE 06/25/2022 1230   KETONESUR NEGATIVE 06/25/2022 1230   PROTEINUR NEGATIVE 06/25/2022 1230   NITRITE POSITIVE (A) 06/25/2022 1230   LEUKOCYTESUR NEGATIVE 06/25/2022 1230   Sepsis Labs Recent Labs  Lab 06/25/22 0329 06/26/22 0043 06/27/22 0115  WBC 9.5 6.4 5.9   Microbiology Recent Results (from the past 240 hour(s))  Blood culture (routine x 2)     Status: None (Preliminary result)   Collection Time: 06/25/22  3:29 AM   Specimen: BLOOD  Result Value Ref Range Status   Specimen Description BLOOD RIGHT ANTECUBITAL  Final   Special Requests AEROBIC BOTTLE ONLY Blood Culture adequate volume  Final   Culture   Final    NO GROWTH 3 DAYS Performed at [facility], [address] [place], [place] [address]    Report Status PENDING  Incomplete  Resp panel by RT-PCR (RSV, Flu A&B, Covid) Anterior Nasal Swab     Status: None   Collection Time: 06/25/22  4:29 AM   Specimen: Anterior Nasal Swab  Result Value Ref Range Status   SARS Coronavirus 2 by RT PCR NEGATIVE NEGATIVE Final    Comment: (NOTE) SARS-CoV-2 target nucleic acids are NOT DETECTED.  The SARS-CoV-2 RNA is generally detectable in upper respiratory specimens during the acute phase of infection. The lowest concentration of SARS-CoV-2 viral copies this assay can detect is 138 copies/mL. A negative result does not preclude SARS-Cov-2 infection and should not be used as the sole basis for treatment or other patient management decisions. A negative result  may occur with  improper specimen collection/handling,  submission of specimen other than nasopharyngeal swab, presence of viral mutation(s) within the areas targeted by this assay, and inadequate number of viral copies(<138 copies/mL). A negative result must be combined with clinical observations, patient history, and epidemiological information. The expected result is Negative.  Fact Sheet for Patients:  BloggerCourse.comhttps://www.fda.gov/media/152166/download  Fact Sheet for Healthcare Providers:  SeriousBroker.ithttps://www.fda.gov/media/152162/download  This test is no t yet approved or cleared by the [place] FDA and  has been authorized for detection and/or diagnosis of SARS-CoV-2 by FDA under an Emergency Use Authorization (EUA). This EUA will remain  in effect (meaning this test can be used) for the duration of the COVID-19 declaration under Section 564(b)(1) of the Act, 21 U.S.C.section 360bbb-3(b)(1), unless the authorization is terminated  or revoked sooner.       Influenza A by PCR NEGATIVE NEGATIVE Final   Influenza B by PCR NEGATIVE NEGATIVE Final    Comment: (NOTE) The Xpert Xpress SARS-CoV-2/FLU/RSV plus assay is intended as an aid in the diagnosis of influenza from Nasopharyngeal swab specimens and should not be used as a sole basis for treatment. Nasal washings and aspirates are unacceptable for Xpert Xpress SARS-CoV-2/FLU/RSV testing.  Fact Sheet for Patients: BloggerCourse.comhttps://www.fda.gov/media/152166/download  Fact Sheet for Healthcare Providers: SeriousBroker.ithttps://www.fda.gov/media/152162/download  This test is not yet approved or cleared by the [place] FDA and has been authorized for detection and/or diagnosis of SARS-CoV-2 by FDA under an Emergency Use Authorization (EUA). This EUA will remain in effect (meaning this test can be used) for the duration of the COVID-19 declaration under Section 564(b)(1) of the Act, 21 U.S.C. section 360bbb-3(b)(1), unless the authorization is terminated or revoked.     Resp Syncytial Virus by PCR NEGATIVE  NEGATIVE Final    Comment: (NOTE) Fact Sheet for Patients: BloggerCourse.comhttps://www.fda.gov/media/152166/download  Fact Sheet for Healthcare Providers: SeriousBroker.ithttps://www.fda.gov/media/152162/download  This test is not yet approved or cleared by the [place] FDA and has been authorized for detection and/or diagnosis of SARS-CoV-2 by FDA under an Emergency Use Authorization (EUA). This EUA will remain in effect (meaning this test can be used) for the duration of the COVID-19 declaration under Section 564(b)(1) of the Act, 21 U.S.C. section 360bbb-3(b)(1), unless the authorization is terminated or revoked.  Performed at [facility] [facility], [address] [place], [place] [address]   Blood culture (routine x 2)     Status: None (Preliminary result)   Collection Time: 06/25/22  6:27 AM   Specimen: BLOOD LEFT FOREARM  Result Value Ref Range Status   Specimen Description BLOOD LEFT FOREARM  Final   Special Requests   Final    BOTTLES DRAWN AEROBIC AND ANAEROBIC Blood Culture results may not be optimal due to an inadequate volume of blood received in culture bottles   Culture   Final    NO GROWTH 3 DAYS Performed at [facility], [address] [place], [place] [address]    Report Status PENDING  Incomplete  Urine Culture     Status: Abnormal   Collection Time: 06/25/22 12:30 PM   Specimen: Urine, Clean Catch  Result Value Ref Range Status   Specimen Description URINE, CLEAN CATCH  Final   Special Requests   Final    NONE Performed at [facility], [address] [place], [place] [address]    Culture MULTIPLE SPECIES PRESENT, SUGGEST RECOLLECTION (A)  Final   Report Status 06/26/2022 FINAL  Final     Time coordinating discharge:  Over 30 minutes  SIGNED:   Huey Bienenstock, MD  [facility] 06/28/2022, 10:58 AM Pager   If 7PM-7AM, please contact night-coverage [email or web address]

## 2022-06-28 NOTE — Progress Notes (Signed)
Mobility Specialist Progress Note:   06/28/22 1259  Mobility  Activity Stood at bedside  Level of Assistance Standby assist, set-up cues, supervision of patient - no hands on  Assistive Device Front wheel walker  Activity Response Tolerated well  Mobility Referral Yes  $Mobility charge 1 Mobility   Pt received sitting EOB with bed alarm going off. Pt requested assistance standing to pull up pants. No complaints. Pt left sitting EOB with all needs met, call bell in reach, and bed alarm on.   Andrey Campanile Mobility Specialist Please contact via SecureChat or  Rehab office at [phone]

## 2022-06-28 NOTE — Progress Notes (Signed)
Patient discharged: Home with family  Via: Wheelchair   Discharge paperwork given: to patient and family  Reviewed with teach back  IV and telemetry disconnected  Belongings given to patient    

## 2022-06-28 NOTE — Progress Notes (Signed)
Called the husband to inform about d/c, not able to reach him, left voice mail with call back.

## 2022-06-30 ENCOUNTER — Other Ambulatory Visit (HOSPITAL_COMMUNITY): Payer: Self-pay

## 2022-06-30 ENCOUNTER — Telehealth: Payer: Self-pay

## 2022-06-30 DIAGNOSIS — I69354 Hemiplegia and hemiparesis following cerebral infarction affecting left non-dominant side: Secondary | ICD-10-CM | POA: Insufficient documentation

## 2022-06-30 DIAGNOSIS — F319 Bipolar disorder, unspecified: Secondary | ICD-10-CM | POA: Insufficient documentation

## 2022-06-30 LAB — CULTURE, BLOOD (ROUTINE X 2)
Culture: NO GROWTH
Culture: NO GROWTH
Special Requests: ADEQUATE

## 2022-06-30 NOTE — TOC Progression Note (Signed)
Transition of Care [facility]) - Progression Note    Patient Details  Name: [name] MRN: [id number] Date of Birth: 02/02/1951  Transition of Care [facility] CM/SW Contact  Zenon Mayo, RN Phone Number: 06/30/2022, 12:59 PM  Clinical Narrative:    NCM received information that patient will need to make apt with her PCP before [facility] agency comes out so they can sign off on any orders.  Malachy Mood with [facility] states she will call patient and let her know to schedule her apt because she stated she was changing PCP, but she actually needs to keep her PCP for the [facility] agency go go out to see her.      Barriers to Discharge: No Barriers Identified  Expected Discharge Plan and Services         Expected Discharge Date: 06/28/22               DME Arranged: N/A           HH Agency: [place] Date HH Agency Contacted: 06/28/22 Time [place]: [id number] Representative spoke with at [place]: [place] Determinants of Health ([place]) Interventions SDOH Screenings   Food Insecurity: No Food Insecurity (06/25/2022)  Housing: Low Risk  (06/25/2022)  Transportation Needs: No Transportation Needs (06/25/2022)  Utilities: Not At Risk (06/25/2022)  Tobacco Use: Low Risk  (06/27/2022)    Readmission Risk Interventions     No data to display

## 2022-06-30 NOTE — Telephone Encounter (Signed)
Loop Recorder Follow up   Is patient connected to [employer]? Not connected yet-she has a nurse coming to her house tomorrow and she wanted her to assist with this.  Have steri-strips fallen off or been removed? No-Pt states site is healing well.  Does the patient need in office follow up? No   Please continue to monitor your cardiac device site for redness, swelling, and drainage. Call the device clinic at [phone] if you experience these symptoms, fever/chills, or have questions about your device.   Remote monitoring is used to monitor your cardiac device from home. This monitoring is scheduled every month by our office. It allows [place] to keep an eye on the functioning of your device to ensure it is working properly.

## 2022-07-02 NOTE — Telephone Encounter (Signed)
LVM for husband to call back, home monitor has not connected

## 2022-07-03 NOTE — Telephone Encounter (Signed)
Spoke with patient and her husband. The monitor was never plugged in and they are having trouble doing it. I have stayed on the phone with them while they try and get it done. They are not able to plug in the monitor and patient was getting frustrated with it. They will try and see if someone can come and help them. Patient has hung up the phone on me. I have tried calling back with no answer

## 2022-07-08 NOTE — Telephone Encounter (Signed)
Call placed to Pt's husband.  (Call to Pt's main number would not go through).  Per husband they were able to get Pt's monitor plugged in.  It is still not transmitting.   Pt is not home to be able to troubleshoot.  Requests a call back tomorrow 07/09/22.

## 2022-07-11 NOTE — Telephone Encounter (Signed)
Called patient and no answer.

## 2022-07-14 DIAGNOSIS — R252 Cramp and spasm: Secondary | ICD-10-CM | POA: Insufficient documentation

## 2022-07-14 DIAGNOSIS — E1342 Other specified diabetes mellitus with diabetic polyneuropathy: Secondary | ICD-10-CM | POA: Insufficient documentation

## 2022-07-14 DIAGNOSIS — E1165 Type 2 diabetes mellitus with hyperglycemia: Secondary | ICD-10-CM | POA: Insufficient documentation

## 2022-07-14 DIAGNOSIS — F332 Major depressive disorder, recurrent severe without psychotic features: Secondary | ICD-10-CM | POA: Insufficient documentation

## 2022-07-14 DIAGNOSIS — E1151 Type 2 diabetes mellitus with diabetic peripheral angiopathy without gangrene: Secondary | ICD-10-CM | POA: Insufficient documentation

## 2022-07-14 DIAGNOSIS — F0280 Dementia in other diseases classified elsewhere without behavioral disturbance: Secondary | ICD-10-CM | POA: Insufficient documentation

## 2022-07-14 NOTE — Telephone Encounter (Signed)
I spoke with the patient and told her to bring the phone to the office so someone can help her transmit with the app.

## 2022-07-16 ENCOUNTER — Inpatient Hospital Stay: Payer: Medicare PPO | Admitting: Neurology

## 2022-07-16 ENCOUNTER — Emergency Department (HOSPITAL_COMMUNITY): Payer: Medicare PPO

## 2022-07-16 ENCOUNTER — Encounter (HOSPITAL_COMMUNITY): Payer: Self-pay

## 2022-07-16 ENCOUNTER — Emergency Department (HOSPITAL_COMMUNITY)
Admission: EM | Admit: 2022-07-16 | Discharge: 2022-07-17 | Disposition: A | Discharge status: Home / Self Care | Payer: Medicare PPO | Attending: Emergency Medicine | Admitting: Emergency Medicine

## 2022-07-16 ENCOUNTER — Other Ambulatory Visit: Payer: Self-pay

## 2022-07-16 DIAGNOSIS — E11649 Type 2 diabetes mellitus with hypoglycemia without coma: Secondary | ICD-10-CM | POA: Diagnosis not present

## 2022-07-16 DIAGNOSIS — Z91040 Latex allergy status: Secondary | ICD-10-CM | POA: Insufficient documentation

## 2022-07-16 DIAGNOSIS — Z7982 Long term (current) use of aspirin: Secondary | ICD-10-CM | POA: Insufficient documentation

## 2022-07-16 DIAGNOSIS — Z79899 Other long term (current) drug therapy: Secondary | ICD-10-CM | POA: Insufficient documentation

## 2022-07-16 DIAGNOSIS — R531 Weakness: Secondary | ICD-10-CM | POA: Diagnosis not present

## 2022-07-16 DIAGNOSIS — Z794 Long term (current) use of insulin: Secondary | ICD-10-CM | POA: Diagnosis not present

## 2022-07-16 DIAGNOSIS — D72829 Elevated white blood cell count, unspecified: Secondary | ICD-10-CM | POA: Diagnosis not present

## 2022-07-16 DIAGNOSIS — I10 Essential (primary) hypertension: Secondary | ICD-10-CM | POA: Diagnosis not present

## 2022-07-16 DIAGNOSIS — E162 Hypoglycemia, unspecified: Secondary | ICD-10-CM

## 2022-07-16 DIAGNOSIS — E876 Hypokalemia: Secondary | ICD-10-CM | POA: Insufficient documentation

## 2022-07-16 DIAGNOSIS — J45909 Unspecified asthma, uncomplicated: Secondary | ICD-10-CM | POA: Diagnosis not present

## 2022-07-16 LAB — CBG MONITORING, ED
Glucose-Capillary: 49 mg/dL — ABNORMAL LOW (ref 70–99)
Glucose-Capillary: 74 mg/dL (ref 70–99)
Glucose-Capillary: 74 mg/dL (ref 70–99)

## 2022-07-16 LAB — CBC
HCT: 38.6 % (ref 36.0–46.0)
Hemoglobin: 12.2 g/dL (ref 12.0–15.0)
MCH: 27.1 pg (ref 26.0–34.0)
MCHC: 31.6 g/dL (ref 30.0–36.0)
MCV: 85.6 fL (ref 80.0–100.0)
Platelets: 375 10*3/uL (ref 150–400)
RBC: 4.51 MIL/uL (ref 3.87–5.11)
RDW: 16.7 % — ABNORMAL HIGH (ref 11.5–15.5)
WBC: 11.1 10*3/uL — ABNORMAL HIGH (ref 4.0–10.5)
nRBC: 0 % (ref 0.0–0.2)

## 2022-07-16 LAB — COMPREHENSIVE METABOLIC PANEL
ALT: 7 U/L (ref 0–44)
AST: 13 U/L — ABNORMAL LOW (ref 15–41)
Albumin: 2.9 g/dL — ABNORMAL LOW (ref 3.5–5.0)
Alkaline Phosphatase: 104 U/L (ref 38–126)
Anion gap: 12 (ref 5–15)
BUN: 5 mg/dL — ABNORMAL LOW (ref 8–23)
CO2: 21 mmol/L — ABNORMAL LOW (ref 22–32)
Calcium: 9.7 mg/dL (ref 8.9–10.3)
Chloride: 104 mmol/L (ref 98–111)
Creatinine, Ser: 0.8 mg/dL (ref 0.44–1.00)
GFR, Estimated: >60 mL/min (ref >60)
Glucose, Bld: 56 mg/dL — ABNORMAL LOW (ref 70–99)
Potassium: 3.2 mmol/L — ABNORMAL LOW (ref 3.5–5.1)
Sodium: 137 mmol/L (ref 135–145)
Total Bilirubin: 0.3 mg/dL (ref 0.3–1.2)
Total Protein: 8.6 g/dL — ABNORMAL HIGH (ref 6.5–8.1)

## 2022-07-16 MED ORDER — POTASSIUM CHLORIDE CRYS ER 20 MEQ PO TBCR
40.0000 meq | EXTENDED_RELEASE_TABLET | Freq: Once | ORAL | Status: AC
  Administered 2022-07-16: 40 meq via ORAL
  Filled 2022-07-16: qty 2

## 2022-07-16 MED ORDER — GLUCOSE 40 % PO GEL
1.0000 | Freq: Once | ORAL | Status: AC
  Administered 2022-07-16: 31 g via ORAL
  Filled 2022-07-16: qty 1.21

## 2022-07-16 MED ORDER — ONDANSETRON HCL 4 MG PO TABS: 4.0000 mg | ORAL_TABLET | Freq: Four times a day (QID) | ORAL | 0 refills | Status: DC

## 2022-07-16 NOTE — ED Notes (Signed)
RN attempting to call husband to pick up patient. Husband number on file not working.

## 2022-07-16 NOTE — ED Notes (Signed)
Patient bed sheets and linens changed.

## 2022-07-16 NOTE — ED Notes (Signed)
RN called temporary number on file. Mailbox was full but RN was allowed to send page for return call.

## 2022-07-16 NOTE — Progress Notes (Signed)
TOC CSW has made several attempts to contact pts contact list.  There has been no answer from either number.  CSW left a SMS with number attached.  CSW will continue to contact pts contact list.  [name], MSW, LCSW-A Pronouns:  She/Her/Hers [facility] Clinical Social Worker Direct Number:  [phone] [email or web address]

## 2022-07-16 NOTE — ED Triage Notes (Signed)
"  From home via EMS, generalized weakness began this morning, PT was at house and she is normally able to get from wheel chair to toilet. This morning unable to do so. Had this same problem back in December with cause unknown" per EMS

## 2022-07-16 NOTE — ED Notes (Signed)
Patient brought back in and reactivated in chart. Patient son and family unsuccessful getting patient to truck height and large taxi height. Patient brought to room when RN notified. Given blankets and food, VS and BS check performed.

## 2022-07-16 NOTE — Discharge Instructions (Signed)
As we discussed I think that your generalized weakness today was because of your low blood sugar.  I encourage you to continue to try to eat, I have prescribed some nausea medication to use as needed if you feel like you are having lack of appetite, if you are feeling decreased appetite and having difficulty stomach and meals frequently I recommend that you follow-up with your primary care doctor to discuss the symptoms, and continue drinking protein shakes as needed to help supplement your diet.

## 2022-07-16 NOTE — ED Provider Notes (Signed)
[place] EMERGENCY DEPARTMENT AT [facility] Provider Note   CSN: [id number] Arrival date & time: 07/16/22  1406     History  Chief Complaint  Patient presents with   Weakness    [name] is a 72 y.o. female with past medical history significant for hypertension, diabetes, obesity, asthma, arthritis, CVA, hyperlipidemia who presents with concern for generalized weakness starting today.  Patient reports that she normally is able to transfer from wheelchair to toilet but was unable to do so this morning.  Patient reports that she had some generalized weakness previously but with no known cause.  She denies any lateral weakness, numbness, she denies any chest pain, shortness of breath, fever, chills, nausea, vomiting.   Weakness      Home Medications Prior to Admission medications   Medication Sig Start Date End Date Taking? Authorizing Provider  ondansetron (ZOFRAN) 4 MG tablet Take 1 tablet (4 mg total) by mouth every 6 (six) hours. 07/16/22  Yes Liem Copenhaver H, PA-C  acetaminophen (TYLENOL) 325 MG tablet Take 2 tablets (650 mg total) by mouth every 6 (six) hours as needed for mild pain (or Fever >/= 101). 06/28/22   Elgergawy, Leana Roe, MD  aspirin EC 81 MG tablet Take 81 mg by mouth daily. Swallow whole.    [provider]  atenolol (TENORMIN) 25 MG tablet Take 25-50 mg by mouth See admin instructions. 50 mg every morning, 25 mg at bedtime    [provider]  cyclobenzaprine (FLEXERIL) 5 MG tablet Take 5 mg by mouth 3 (three) times daily as needed for muscle spasms. 06/03/22   [provider]  donepezil (ARICEPT) 10 MG tablet Take 10 mg by mouth in the morning.    [provider]  gabapentin (NEURONTIN) 300 MG capsule Take 2 capsules (600 mg total) by mouth 2 (two) times daily. Patient taking differently: Take 300 mg by mouth 2 (two) times daily. 02/08/21   Sabino Dick, DO  insulin glargine (LANTUS) 100 UNIT/ML  Solostar Pen Inject 30 Units into the skin every morning. And pen needles 1/day 06/28/22   Elgergawy, Leana Roe, MD  lurasidone (LATUDA) 40 MG TABS tablet Take 1 tablet (40 mg total) by mouth daily with breakfast. 06/16/22   Bobbye Morton, MD  NIFEdipine (PROCARDIA XL/NIFEDICAL-XL) 90 MG 24 hr tablet Take 90 mg by mouth in the morning.    [provider]  traZODone (DESYREL) 50 MG tablet Take 1 tablet (50 mg total) by mouth at bedtime and may repeat dose one time if needed. 06/02/22   Bobbye Morton, MD      Allergies    Ace inhibitors, Latex, Tizanidine, Ultram [tramadol], Diprivan [propofol], and Metformin and related    Review of Systems   Review of Systems  Neurological:  Positive for weakness.  All other systems reviewed and are negative.   Physical Exam Updated Vital Signs BP (!) 109/91   Pulse 78   Temp (!) 96.8 F (36 C) (Oral)   Resp 17   Ht 5\' 5"  (1.651 m)   Wt 125.2 kg   SpO2 96%   BMI 45.93 kg/m  Physical Exam Vitals and nursing note reviewed.  Constitutional:      General: She is not in acute distress.    Appearance: Normal appearance.  HENT:     Head: Normocephalic and atraumatic.  Eyes:     General:        Right eye: No discharge.  Left eye: No discharge.  Cardiovascular:     Rate and Rhythm: Normal rate and regular rhythm.     Heart sounds: No murmur heard.    No friction rub. No gallop.  Pulmonary:     Effort: Pulmonary effort is normal.     Breath sounds: Normal breath sounds.  Abdominal:     General: Bowel sounds are normal.     Palpations: Abdomen is soft.  Skin:    General: Skin is warm and dry.     Capillary Refill: Capillary refill takes less than 2 seconds.  Neurological:     Mental Status: She is alert and oriented to person, place, and time.     Comments: Moves all 4 limbs spontaneously, CN II through XII grossly intact, can ambulate without difficulty, intact sensation throughout.  Patient with strength deficit on left  compared to right but she reports this is similar or same compared to baseline.   Psychiatric:        Mood and Affect: Mood normal.        Behavior: Behavior normal.     ED Results / Procedures / Treatments   Labs (all labs ordered are listed, but only abnormal results are displayed) Labs Reviewed  COMPREHENSIVE METABOLIC PANEL - Abnormal; Notable for the following components:      Result Value   Potassium 3.2 (*)    CO2 21 (*)    Glucose, Bld 56 (*)    BUN 5 (*)    Total Protein 8.6 (*)    Albumin 2.9 (*)    AST 13 (*)    All other components within normal limits  CBC - Abnormal; Notable for the following components:   WBC 11.1 (*)    RDW 16.7 (*)    All other components within normal limits  CBG MONITORING, ED - Abnormal; Notable for the following components:   Glucose-Capillary 49 (*)    All other components within normal limits  CBG MONITORING, ED    EKG None  Radiology CT Head Wo Contrast  Result Date: 07/16/2022 CLINICAL DATA:  Weakness EXAM: CT HEAD WITHOUT CONTRAST TECHNIQUE: Contiguous axial images were obtained from the base of the skull through the vertex without intravenous contrast. RADIATION DOSE REDUCTION: This exam was performed according to the departmental dose-optimization program which includes automated exposure control, adjustment of the mA and/or kV according to patient size and/or use of iterative reconstruction technique. COMPARISON:  CT head 06/25/2022.  MRI brain 06/12/2022. FINDINGS: Brain: There is loss of gray-white matter distinction and hypodensity within the right insula and adjacent right frontal lobe worrisome for acute or subacute infarct. This is same region when compared to prior MRI. There is no mass effect, acute hemorrhage or extra-axial fluid collection. Ventricles are normal in size. Brain volume is age-appropriate. Vascular: No hyperdense vessel or unexpected calcification. Skull: Normal. Negative for fracture or focal lesion.  Sinuses/Orbits: No acute finding. Other: None. IMPRESSION: Loss of gray-white matter distinction and hypodensity within the right insula and adjacent right frontal lobe worrisome for acute or subacute infarct. This is same region when compared to prior MRI. No acute hemorrhage or mass effect. Electronically Signed   By: Ronney Asters M.D.   On: 07/16/2022 17:13   DG Chest 2 View  Result Date: 07/16/2022 CLINICAL DATA:  Overall body weakness. History of asthma, hypertension and diabetes. EXAM: CHEST - 2 VIEW COMPARISON:  06/25/2022 FINDINGS: Examination is again significantly degraded due to patient body habitus. Loop recorder overlies the left  lateral border of the heart. Additional radiopaque electronic device overlying the right lower lung may be external to the patient. Otherwise, unchanged cardiac silhouette and mediastinal contours. Overall improved aeration of the lungs with minimal residual bilateral infrahilar opacities. No new focal airspace opacities. No pleural effusion or pneumothorax. No acute osseous abnormalities. IMPRESSION: 1. Overall improved aeration of the lungs suggests resolving edema. 2. No new focal airspace opacities to suggest pneumonia. 3. Post implantation of loop recorder overlying the left heart border. Electronically Signed   By: Sandi Mariscal M.D.   On: 07/16/2022 15:42    Procedures Procedures    Medications Ordered in ED Medications  dextrose (GLUTOSE) oral gel 40% (31 g Oral Given 07/16/22 1606)  potassium chloride SA (KLOR-CON M) CR tablet 40 mEq (40 mEq Oral Given 07/16/22 1729)    ED Course/ Medical Decision Making/ A&P                             Medical Decision Making Amount and/or Complexity of Data Reviewed Labs: ordered. Radiology: ordered.  Risk OTC drugs.   This patient is a 72 y.o. female who presents to the ED for concern of generalized weakness, this involves an extensive number of treatment options, and is a complaint that carries with it a  high risk of complications and morbidity. The emergent differential diagnosis prior to evaluation includes, but is not limited to,  CVA, spinal cord injury, ACS, arrhythmia, syncope, orthostatic hypotension, sepsis, hypoglycemia, electrolyte disturbance, hypothyroidism, respiratory failure, anemia, dehydration, heat injury, polypharmacy, malignancy. . This is not an exhaustive differential.   Past Medical History / Co-morbidities / Social History: Patient with previous history of hypertension, diabetes, obesity, asthma, arthritis, CVA, hyperlipidemia  Additional history: Chart reviewed. 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 The pertinent findings include: Patient with no focal neurologic deficits, she has some left-sided weakness which is stable compared to her baseline, no other new deficits noted.  She is not in any distress, her vital signs are overall stable, she had some diastolic hypertension with blood pressure 109/91 at last check, but her pulse, respiratory rate, and oxygen saturation on room air have all been normal.  She has no significant tenderness palpation of the abdomen.  Lab Tests: I ordered, and personally interpreted labs.  The pertinent results include: CBC notable for mild leukocytosis, white blood cells 11.1.  CMP notable for moderate hypokalemia, testing 3.2, we will orally replete, encourage recheck with PCP, once again with some hypoglycemia in the emergency department today, glucose was 49 on CBG, improved to 74 after oral glucose   Imaging Studies: I ordered imaging studies including CT head without contrast, plain film chest x-ray. I independently visualized and interpreted imaging which showed overall improved aeration of the lungs, no significant edema, focal consolidation, CT head with what  appears to be remote infarct in the right MCA territory seen previously on MRI. I agree with the radiologist interpretation.   Cardiac Monitoring:  The patient was maintained on a cardiac monitor.  My attending physician Dr. Truett Mainland viewed and interpreted the cardiac monitored which showed an underlying rhythm of: NSR, q waves from likely LVH noted. I agree with this interpretation.   Medications: I ordered medication including glucose for hypoglycemia, potassium chloride for hypokalemia. Reevaluation of the patient after these medicines  showed that the patient improved. I have reviewed the patients home medicines and have made adjustments as needed.  Stressed the importance of maintaining oral intake at home specially if she is taking her diabetes medications, she reported that she has been trying to drink some protein shakes when she does not have significant appetite but has not always been successful but is continuing to try.  I will discharge her with some nausea medication, and encourage close PCP follow-up to discuss her appetite changes.  May be secondary to her many chronic disease states, but no focal intra-abdominal cause of abdominal pain suspected today.    Disposition: After consideration of the diagnostic results and the patients response to treatment, I feel that patient's generalized weakness is consistent with hypoglycemia, and but she is stable for discharge at this time, would not recommend any additional workup.Marland Kitchen   emergency department workup does not suggest an emergent condition requiring admission or immediate intervention beyond what has been performed at this time. The plan is: as above. The patient is safe for discharge and has been instructed to return immediately for worsening symptoms, change in symptoms or any other concerns.  I discussed this case with my attending physician Dr. Truett Mainland who cosigned this note including patient's presenting symptoms, physical exam, and  planned diagnostics and interventions. Attending physician stated agreement with plan or made changes to plan which were implemented.    Final Clinical Impression(s) / ED Diagnoses Final diagnoses:  Weakness  Hypoglycemia    Rx / DC Orders ED Discharge Orders          Ordered    ondansetron (ZOFRAN) 4 MG tablet  Every 6 hours        07/16/22 1749              Anselmo Pickler, PA-C 07/16/22 1810    Cristie Hem, MD 07/16/22 2237

## 2022-07-16 NOTE — ED Notes (Signed)
RN spoke with husband and he informed RN that he was coming to pick her up.

## 2022-07-16 NOTE — ED Notes (Signed)
Patient discharged home with son and husband. Patient insistent on wanting to go at this time.

## 2022-07-16 NOTE — ED Notes (Signed)
Patient denies pain and is resting comfortably.  

## 2022-07-16 NOTE — ED Notes (Signed)
Ptar called pt is number 2 on the list 

## 2022-07-21 ENCOUNTER — Ambulatory Visit (HOSPITAL_COMMUNITY): Payer: Medicare Other | Admitting: Student in an Organized Health Care Education/Training Program

## 2022-08-05 ENCOUNTER — Ambulatory Visit: Payer: Medicare PPO | Attending: Cardiology | Admitting: Cardiology

## 2022-08-06 ENCOUNTER — Telehealth: Payer: Self-pay | Admitting: *Deleted

## 2022-08-06 NOTE — Telephone Encounter (Signed)
Patient was enrolled for a cardiac event monitor in January but it does not appear [employer] shipped a monitor, or that a [employer] monitor was ever applied and baselined. Patient did have a loop recorder inserted. Call will be forwarded to Willeen Cass in device clinic.

## 2022-08-06 NOTE — Telephone Encounter (Signed)
Patient's husband called and said that patient's heart monitor came off and wants to know what to do next

## 2022-08-07 NOTE — Telephone Encounter (Signed)
Multiple calls made to Pt and husband to assist with setting up remote transmissions.  Unclear if Pt received the original [employer] monitor and applied (?).  Pt is still not transmitting.  Left detailed message advising Pt/husband they need to come to the [facility] office on Monday to establish remote monitoring.  Advised to call device clinic back to confirm time of arrival.

## 2022-08-08 NOTE — Telephone Encounter (Signed)
LVM for patient/ patients husband to call device clinic back regarding home monitor. Will send letter to patient to have them call and set up appointment .

## 2022-08-18 ENCOUNTER — Encounter (HOSPITAL_BASED_OUTPATIENT_CLINIC_OR_DEPARTMENT_OTHER): Payer: Medicare PPO | Admitting: General Surgery

## 2022-08-18 ENCOUNTER — Telehealth: Payer: Self-pay | Admitting: Cardiology

## 2022-08-18 NOTE — Telephone Encounter (Signed)
New Message:       Please call, question about the Monitor

## 2022-08-19 NOTE — Telephone Encounter (Signed)
LMOVM for patient to call the device clinic and also let them know they can bring the phone app to the office to get help.

## 2022-08-20 ENCOUNTER — Inpatient Hospital Stay: Payer: Medicare PPO | Admitting: Neurology

## 2022-08-20 NOTE — Telephone Encounter (Signed)
LMOVM for the 2nd time.

## 2022-09-02 DIAGNOSIS — F3132 Bipolar disorder, current episode depressed, moderate: Secondary | ICD-10-CM | POA: Insufficient documentation

## 2022-09-02 DIAGNOSIS — E1151 Type 2 diabetes mellitus with diabetic peripheral angiopathy without gangrene: Secondary | ICD-10-CM | POA: Insufficient documentation

## 2022-09-08 NOTE — Telephone Encounter (Signed)
Letter sent 09/08/2022.

## 2022-09-23 ENCOUNTER — Other Ambulatory Visit: Payer: Self-pay

## 2022-09-23 NOTE — Patient Outreach (Signed)
First telephone outreach attempt to obtain mRS. No answer. Left message for returned call.  Christoper Bushey [facility]-Care Management Assistant [phone]  

## 2022-09-25 ENCOUNTER — Other Ambulatory Visit: Payer: Self-pay

## 2022-09-25 NOTE — Patient Outreach (Signed)
Second telephone outreach attempt to obtain mRS. No answer. Left message for returned call.  [name] [facility]-Care Management Assistant [phone]  

## 2022-09-29 ENCOUNTER — Other Ambulatory Visit: Payer: Self-pay

## 2022-09-29 NOTE — Patient Outreach (Signed)
3 outreach attempts were completed to obtain mRs. mRs could not be obtained because patient never returned my calls. mRs=7    [name] Care Management Assistant [phone]  

## 2022-10-10 ENCOUNTER — Ambulatory Visit (HOSPITAL_BASED_OUTPATIENT_CLINIC_OR_DEPARTMENT_OTHER): Payer: Medicare PPO | Admitting: Internal Medicine

## 2022-10-13 ENCOUNTER — Inpatient Hospital Stay: Payer: Medicare PPO | Admitting: Neurology

## 2022-10-15 ENCOUNTER — Inpatient Hospital Stay: Payer: Medicare PPO | Admitting: Neurology

## 2022-10-22 ENCOUNTER — Emergency Department (HOSPITAL_COMMUNITY): Payer: Medicare PPO

## 2022-10-22 ENCOUNTER — Other Ambulatory Visit: Payer: Self-pay

## 2022-10-22 ENCOUNTER — Encounter (HOSPITAL_COMMUNITY): Payer: Self-pay | Admitting: Pharmacy Technician

## 2022-10-22 ENCOUNTER — Inpatient Hospital Stay (HOSPITAL_COMMUNITY)
Admission: EM | Admit: 2022-10-22 | Discharge: 2022-10-29 | DRG: 623 | Disposition: A | Discharge status: Home Health | Payer: Medicare PPO | Attending: Internal Medicine | Admitting: Internal Medicine

## 2022-10-22 DIAGNOSIS — H53123 Transient visual loss, bilateral: Secondary | ICD-10-CM | POA: Diagnosis present

## 2022-10-22 DIAGNOSIS — Z66 Do not resuscitate: Secondary | ICD-10-CM | POA: Diagnosis present

## 2022-10-22 DIAGNOSIS — S81809A Unspecified open wound, unspecified lower leg, initial encounter: Secondary | ICD-10-CM | POA: Diagnosis present

## 2022-10-22 DIAGNOSIS — G43909 Migraine, unspecified, not intractable, without status migrainosus: Secondary | ICD-10-CM | POA: Diagnosis present

## 2022-10-22 DIAGNOSIS — Z91040 Latex allergy status: Secondary | ICD-10-CM

## 2022-10-22 DIAGNOSIS — L03119 Cellulitis of unspecified part of limb: Principal | ICD-10-CM

## 2022-10-22 DIAGNOSIS — F03A3 Unspecified dementia, mild, with mood disturbance: Secondary | ICD-10-CM | POA: Diagnosis present

## 2022-10-22 DIAGNOSIS — G8929 Other chronic pain: Secondary | ICD-10-CM | POA: Diagnosis present

## 2022-10-22 DIAGNOSIS — E782 Mixed hyperlipidemia: Secondary | ICD-10-CM | POA: Diagnosis present

## 2022-10-22 DIAGNOSIS — I87333 Chronic venous hypertension (idiopathic) with ulcer and inflammation of bilateral lower extremity: Secondary | ICD-10-CM

## 2022-10-22 DIAGNOSIS — Z9049 Acquired absence of other specified parts of digestive tract: Secondary | ICD-10-CM

## 2022-10-22 DIAGNOSIS — Z8673 Personal history of transient ischemic attack (TIA), and cerebral infarction without residual deficits: Secondary | ICD-10-CM

## 2022-10-22 DIAGNOSIS — Z825 Family history of asthma and other chronic lower respiratory diseases: Secondary | ICD-10-CM

## 2022-10-22 DIAGNOSIS — I10 Essential (primary) hypertension: Secondary | ICD-10-CM | POA: Diagnosis present

## 2022-10-22 DIAGNOSIS — Z86010 Personal history of colonic polyps: Secondary | ICD-10-CM

## 2022-10-22 DIAGNOSIS — E11622 Type 2 diabetes mellitus with other skin ulcer: Secondary | ICD-10-CM | POA: Diagnosis not present

## 2022-10-22 DIAGNOSIS — Z90710 Acquired absence of both cervix and uterus: Secondary | ICD-10-CM

## 2022-10-22 DIAGNOSIS — F419 Anxiety disorder, unspecified: Secondary | ICD-10-CM | POA: Diagnosis present

## 2022-10-22 DIAGNOSIS — L97919 Non-pressure chronic ulcer of unspecified part of right lower leg with unspecified severity: Secondary | ICD-10-CM | POA: Diagnosis present

## 2022-10-22 DIAGNOSIS — Z884 Allergy status to anesthetic agent status: Secondary | ICD-10-CM

## 2022-10-22 DIAGNOSIS — Z8261 Family history of arthritis: Secondary | ICD-10-CM

## 2022-10-22 DIAGNOSIS — E1140 Type 2 diabetes mellitus with diabetic neuropathy, unspecified: Secondary | ICD-10-CM | POA: Diagnosis present

## 2022-10-22 DIAGNOSIS — Z8719 Personal history of other diseases of the digestive system: Secondary | ICD-10-CM

## 2022-10-22 DIAGNOSIS — Z794 Long term (current) use of insulin: Secondary | ICD-10-CM

## 2022-10-22 DIAGNOSIS — Z6841 Body Mass Index (BMI) 40.0 and over, adult: Secondary | ICD-10-CM

## 2022-10-22 DIAGNOSIS — Z823 Family history of stroke: Secondary | ICD-10-CM

## 2022-10-22 DIAGNOSIS — Z8249 Family history of ischemic heart disease and other diseases of the circulatory system: Secondary | ICD-10-CM

## 2022-10-22 DIAGNOSIS — M479 Spondylosis, unspecified: Secondary | ICD-10-CM | POA: Diagnosis present

## 2022-10-22 DIAGNOSIS — Z9851 Tubal ligation status: Secondary | ICD-10-CM

## 2022-10-22 DIAGNOSIS — E1165 Type 2 diabetes mellitus with hyperglycemia: Secondary | ICD-10-CM | POA: Diagnosis present

## 2022-10-22 DIAGNOSIS — F319 Bipolar disorder, unspecified: Secondary | ICD-10-CM | POA: Diagnosis present

## 2022-10-22 DIAGNOSIS — L97909 Non-pressure chronic ulcer of unspecified part of unspecified lower leg with unspecified severity: Secondary | ICD-10-CM | POA: Diagnosis not present

## 2022-10-22 DIAGNOSIS — J45909 Unspecified asthma, uncomplicated: Secondary | ICD-10-CM | POA: Diagnosis present

## 2022-10-22 DIAGNOSIS — M545 Low back pain, unspecified: Secondary | ICD-10-CM | POA: Diagnosis present

## 2022-10-22 DIAGNOSIS — L97929 Non-pressure chronic ulcer of unspecified part of left lower leg with unspecified severity: Secondary | ICD-10-CM | POA: Diagnosis present

## 2022-10-22 DIAGNOSIS — Z79899 Other long term (current) drug therapy: Secondary | ICD-10-CM

## 2022-10-22 DIAGNOSIS — Z885 Allergy status to narcotic agent status: Secondary | ICD-10-CM

## 2022-10-22 DIAGNOSIS — Z7982 Long term (current) use of aspirin: Secondary | ICD-10-CM

## 2022-10-22 DIAGNOSIS — I69354 Hemiplegia and hemiparesis following cerebral infarction affecting left non-dominant side: Secondary | ICD-10-CM

## 2022-10-22 DIAGNOSIS — M109 Gout, unspecified: Secondary | ICD-10-CM | POA: Diagnosis present

## 2022-10-22 DIAGNOSIS — Z888 Allergy status to other drugs, medicaments and biological substances status: Secondary | ICD-10-CM

## 2022-10-22 DIAGNOSIS — M1909 Primary osteoarthritis, other specified site: Secondary | ICD-10-CM | POA: Diagnosis present

## 2022-10-22 DIAGNOSIS — E785 Hyperlipidemia, unspecified: Secondary | ICD-10-CM | POA: Diagnosis present

## 2022-10-22 DIAGNOSIS — E119 Type 2 diabetes mellitus without complications: Secondary | ICD-10-CM

## 2022-10-22 LAB — CBC WITH DIFFERENTIAL/PLATELET
Abs Immature Granulocytes: 0.03 10*3/uL (ref 0.00–0.07)
Basophils Absolute: 0 10*3/uL (ref 0.0–0.1)
Basophils Relative: 0 %
Eosinophils Absolute: 0.3 10*3/uL (ref 0.0–0.5)
Eosinophils Relative: 4 %
HCT: 39.2 % (ref 36.0–46.0)
Hemoglobin: 12.5 g/dL (ref 12.0–15.0)
Immature Granulocytes: 0 %
Lymphocytes Relative: 23 %
Lymphs Abs: 2.1 10*3/uL (ref 0.7–4.0)
MCH: 27 pg (ref 26.0–34.0)
MCHC: 31.9 g/dL (ref 30.0–36.0)
MCV: 84.7 fL (ref 80.0–100.0)
Monocytes Absolute: 0.6 10*3/uL (ref 0.1–1.0)
Monocytes Relative: 7 %
Neutro Abs: 6 10*3/uL (ref 1.7–7.7)
Neutrophils Relative %: 66 %
Platelets: 406 10*3/uL — ABNORMAL HIGH (ref 150–400)
RBC: 4.63 MIL/uL (ref 3.87–5.11)
RDW: 15.9 % — ABNORMAL HIGH (ref 11.5–15.5)
WBC: 9.2 10*3/uL (ref 4.0–10.5)
nRBC: 0 % (ref 0.0–0.2)

## 2022-10-22 LAB — COMPREHENSIVE METABOLIC PANEL
ALT: 11 U/L (ref 0–44)
AST: 19 U/L (ref 15–41)
Albumin: 2.8 g/dL — ABNORMAL LOW (ref 3.5–5.0)
Alkaline Phosphatase: 90 U/L (ref 38–126)
Anion gap: 11 (ref 5–15)
BUN: 8 mg/dL (ref 8–23)
CO2: 23 mmol/L (ref 22–32)
Calcium: 9.6 mg/dL (ref 8.9–10.3)
Chloride: 102 mmol/L (ref 98–111)
Creatinine, Ser: 0.87 mg/dL (ref 0.44–1.00)
GFR, Estimated: >60 mL/min (ref >60)
Glucose, Bld: 174 mg/dL — ABNORMAL HIGH (ref 70–99)
Potassium: 4.3 mmol/L (ref 3.5–5.1)
Sodium: 136 mmol/L (ref 135–145)
Total Bilirubin: 0.5 mg/dL (ref 0.3–1.2)
Total Protein: 8.5 g/dL — ABNORMAL HIGH (ref 6.5–8.1)

## 2022-10-22 LAB — CBG MONITORING, ED: Glucose-Capillary: 160 mg/dL — ABNORMAL HIGH (ref 70–99)

## 2022-10-22 LAB — LACTIC ACID, PLASMA: Lactic Acid, Venous: 1.3 mmol/L (ref 0.5–1.9)

## 2022-10-22 MED ORDER — VANCOMYCIN HCL 2000 MG/400ML IV SOLN
2000.0000 mg | Freq: Once | INTRAVENOUS | Status: AC
  Administered 2022-10-22: 2000 mg via INTRAVENOUS
  Filled 2022-10-22: qty 400

## 2022-10-22 MED ORDER — VANCOMYCIN HCL 1500 MG/300ML IV SOLN: 1500.0000 mg | INTRAVENOUS | Status: DC

## 2022-10-22 NOTE — Progress Notes (Signed)
Pharmacy Antibiotic Note  [name] is a 72 y.o. 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  Pharmacy has been consulted for vancomycin dosing.  Plan: Vancomycin 2g IV x 1, then 1500 mg IV q 24h (eAUC 506) Monitor renal function, Cx and clinical progression to narrow Vancomycin levels as indicated     Temp (24hrs), Avg:98.3 F (36.8 C), Min:98.3 F (36.8 C), Max:98.4 F (36.9 C)  Recent Labs  Lab 10/22/22 1754 10/22/22 2120 10/22/22 2130  WBC 9.2  --   --   CREATININE  --   --  0.87  LATICACIDVEN  --  1.3  --     CrCl cannot be calculated (Unknown ideal weight.).    Allergies  Allergen Reactions   Ace Inhibitors Swelling   Latex Itching and Swelling   Tizanidine Other (See Comments)    Tremors    Ultram [Tramadol] Nausea And Vomiting   Diprivan [Propofol] Itching   Metformin And Related Other (See Comments)    Tremors  Chills    Daylene Posey, PharmD, [facility] Clinical Pharmacist ED Pharmacist Phone # [phone] 10/22/2022 10:37 PM

## 2022-10-22 NOTE — ED Triage Notes (Signed)
Pt here with diabetic ulcers to bil lower extremities. Sent here from wound care for evaluation.

## 2022-10-22 NOTE — ED Provider Notes (Signed)
[place] EMERGENCY DEPARTMENT AT [facility] Provider Note   CSN: [id number] Arrival date & time: 10/22/22  1646     History {Add pertinent medical, surgical, social history, OB history to HPI:1} LEg wound  [name] is a 72 y.o. female.  HPI   Pt states she has had ulcers in her legs associated with diabetes for the last year.  No fevers or chills.  Pt was evaluated by a home health nurse today.  They sent her to the ED for evaluation.  They have not been healing well.  The wounds weep significantly.  Home Medications Prior to Admission medications   Medication Sig Start Date End Date Taking? Authorizing Provider  acetaminophen (TYLENOL) 325 MG tablet Take 2 tablets (650 mg total) by mouth every 6 (six) hours as needed for mild pain (or Fever >/= 101). 06/28/22   Elgergawy, Leana Roe, MD  aspirin EC 81 MG tablet Take 81 mg by mouth daily. Swallow whole.    [provider]  atenolol (TENORMIN) 25 MG tablet Take 25-50 mg by mouth See admin instructions. 50 mg every morning, 25 mg at bedtime    [provider]  cyclobenzaprine (FLEXERIL) 5 MG tablet Take 5 mg by mouth 3 (three) times daily as needed for muscle spasms. 06/03/22   [provider]  donepezil (ARICEPT) 10 MG tablet Take 10 mg by mouth in the morning.    [provider]  gabapentin (NEURONTIN) 300 MG capsule Take 2 capsules (600 mg total) by mouth 2 (two) times daily. Patient taking differently: Take 300 mg by mouth 2 (two) times daily. 02/08/21   Sabino Dick, DO  insulin glargine (LANTUS) 100 UNIT/ML Solostar Pen Inject 30 Units into the skin every morning. And pen needles 1/day 06/28/22   Elgergawy, Leana Roe, MD  lurasidone (LATUDA) 40 MG TABS tablet Take 1 tablet (40 mg total) by mouth daily with breakfast. 06/16/22   Bobbye Morton, MD  NIFEdipine (PROCARDIA XL/NIFEDICAL-XL) 90 MG 24 hr tablet Take 90 mg by mouth in the morning.    [provider]   ondansetron (ZOFRAN) 4 MG tablet Take 1 tablet (4 mg total) by mouth every 6 (six) hours. 07/16/22   Prosperi, Christian H, PA-C  traZODone (DESYREL) 50 MG tablet Take 1 tablet (50 mg total) by mouth at bedtime and may repeat dose one time if needed. 06/02/22   Bobbye Morton, MD      Allergies    Ace inhibitors, Latex, Tizanidine, Ultram [tramadol], Diprivan [propofol], and Metformin and related    Review of Systems   Review of Systems  Physical Exam Updated Vital Signs BP (!) 108/58 (BP Location: Right Arm)   Pulse 77   Temp 98.3 F (36.8 C) (Oral)   Resp 16   SpO2 96%  Physical Exam Vitals and nursing note reviewed.  Constitutional:      General: She is not in acute distress.    Appearance: She is well-developed.  HENT:     Head: Normocephalic and atraumatic.     Right Ear: External ear normal.     Left Ear: External ear normal.  Eyes:     General: No scleral icterus.       Right eye: No discharge.        Left eye: No discharge.     Conjunctiva/sclera: Conjunctivae normal.  Neck:     Trachea: No tracheal deviation.  Cardiovascular:     Rate and Rhythm: Normal rate.  Pulmonary:  Effort: Pulmonary effort is normal. No respiratory distress.     Breath sounds: No stridor.  Abdominal:     General: There is no distension.  Musculoskeletal:        General: No swelling or deformity.     Cervical back: Neck supple.     Right lower leg: Edema present.     Left lower leg: Edema present.     Comments: Ulcerative wound bilateral lower extremity, edema left greater than right, fungating tissue, hypertrophic thickened skin  Skin:    General: Skin is warm and dry.     Findings: No rash.  Neurological:     Mental Status: She is alert. Mental status is at baseline.     Cranial Nerves: No dysarthria or facial asymmetry.     Motor: No seizure activity.        ED Results / Procedures / Treatments   Labs (all labs ordered are listed, but only abnormal results are  displayed) Labs Reviewed  CBC WITH DIFFERENTIAL/PLATELET - Abnormal; Notable for the following components:      Result Value   RDW 15.9 (*)    Platelets 406 (*)    All other components within normal limits  COMPREHENSIVE METABOLIC PANEL - Abnormal; Notable for the following components:   Glucose, Bld 174 (*)    Total Protein 8.5 (*)    Albumin 2.8 (*)    All other components within normal limits  CBG MONITORING, ED - Abnormal; Notable for the following components:   Glucose-Capillary 160 (*)    All other components within normal limits  LACTIC ACID, PLASMA  LACTIC ACID, PLASMA    EKG None  Radiology DG Foot Complete Right  Result Date: 10/22/2022 CLINICAL DATA:  Diabetic foot wound EXAM: RIGHT FOOT COMPLETE - 3+ VIEW COMPARISON:  10/15/2021 FINDINGS: Osseous demineralization. No fracture or malalignment. No definitive osseous destructive change. Small plantar calcaneal spur. Generalized soft tissue swelling. Possible ulcer dorsal aspect of the foot. Possible ulcer posterior heel. No soft tissue gas. IMPRESSION: 1. Osseous demineralization. No acute osseous abnormality. 2. Generalized soft tissue swelling. Possible ulcer dorsal aspect of the foot and posterior heel. Electronically Signed   By: Jasmine Pang M.D.   On: 10/22/2022 22:09   DG Foot Complete Left  Result Date: 10/22/2022 CLINICAL DATA:  Diabetic wound. Pain. EXAM: LEFT FOOT - COMPLETE 3+ VIEW COMPARISON:  Radiograph 10/15/2021 FINDINGS: Bony under mineralization. Hammertoe deformity of the toes. There is hallux valgus with degenerative change of the first metatarsal phalangeal joint. Erosion is noted about the medial aspect of the first metatarsal head, chronic. Plantar calcaneal spur and Achilles tendon enthesophyte. Dorsal soft tissue thickening. There is skin and soft tissue irregularity about the dorsum of the mid and forefoot. Skin irregularity posterior to the calcaneus. No evidence of soft tissue gas or radiopaque foreign  body. Multiple soft tissue calcifications involving the distal lower leg. IMPRESSION: 1. Soft tissue irregularity about the dorsum of the mid and forefoot and skin irregularity posterior to the calcaneus, suspicious for soft tissue ulcer. No radiographic evidence of osteomyelitis. 2. Hallux valgus with degenerative change of the first metatarsophalangeal joint. Chronic erosion about the first metatarsal head. Electronically Signed   By: Narda Rutherford M.D.   On: 10/22/2022 22:05    Procedures Procedures  {Document cardiac monitor, telemetry assessment procedure when appropriate:1}  Medications Ordered in ED Medications  vancomycin (VANCOREADY) IVPB 2000 mg/400 mL (2,000 mg Intravenous New Bag/Given 10/22/22 2310)  vancomycin (VANCOREADY) IVPB 1500 mg/300 mL (  has no administration in time range)    ED Course/ Medical Decision Making/ A&P Clinical Course as of 10/22/22 2344  Wed Oct 22, 2022  2224 CBC normal.  Metabolic panel normal.  Lactic acid level not elevated [JK]  2225 X-rays without definitive signs of osteomyelitis [JK]    Clinical Course User Index [JK] Linwood Dibbles, MD   {   Click here for ABCD2, HEART and other calculatorsREFRESH Note before signing :1}                          Medical Decision Making Problems Addressed: Cellulitis of lower extremity, unspecified laterality: acute illness or injury that poses a threat to life or bodily functions Diabetic ulcer of left lower leg (HCC): acute illness or injury  Amount and/or Complexity of Data Reviewed Labs: ordered. Decision-making details documented in ED Course. Radiology: ordered and independent interpretation performed.  Risk Prescription drug management.   Patient presented to ED for evaluation of increasing wound drainage.  Patient's not having any fever or systemic signs of infection.  Laboratory tests are reassuring but her wounds show ulceration and significant weeping.  I am suspicious of a component of  peripheral vascular disease although no signs of acute vascular occlusion.  Do think she would benefit from IV antibiotic treatment, admission to the hospital, consultation from wound care and possible vascular studies.  {Document critical care time when appropriate:1} {Document review of labs and clinical decision tools ie heart score, Chads2Vasc2 etc:1}  {Document your independent review of radiology images, and any outside records:1} {Document your discussion with family members, caretakers, and with consultants:1} {Document social determinants of health affecting pt's care:1} {Document your decision making why or why not admission, treatments were needed:1} Final Clinical Impression(s) / ED Diagnoses Final diagnoses:  Cellulitis of lower extremity, unspecified laterality  Diabetic ulcer of left lower leg (HCC)    Rx / DC Orders ED Discharge Orders     None

## 2022-10-23 ENCOUNTER — Inpatient Hospital Stay (HOSPITAL_COMMUNITY): Payer: Medicare PPO

## 2022-10-23 DIAGNOSIS — H539 Unspecified visual disturbance: Secondary | ICD-10-CM | POA: Diagnosis not present

## 2022-10-23 DIAGNOSIS — Z6841 Body Mass Index (BMI) 40.0 and over, adult: Secondary | ICD-10-CM | POA: Diagnosis not present

## 2022-10-23 DIAGNOSIS — Z8249 Family history of ischemic heart disease and other diseases of the circulatory system: Secondary | ICD-10-CM | POA: Diagnosis not present

## 2022-10-23 DIAGNOSIS — I872 Venous insufficiency (chronic) (peripheral): Secondary | ICD-10-CM | POA: Diagnosis not present

## 2022-10-23 DIAGNOSIS — F319 Bipolar disorder, unspecified: Secondary | ICD-10-CM | POA: Diagnosis present

## 2022-10-23 DIAGNOSIS — G8929 Other chronic pain: Secondary | ICD-10-CM | POA: Diagnosis present

## 2022-10-23 DIAGNOSIS — Z794 Long term (current) use of insulin: Secondary | ICD-10-CM | POA: Diagnosis not present

## 2022-10-23 DIAGNOSIS — E1140 Type 2 diabetes mellitus with diabetic neuropathy, unspecified: Secondary | ICD-10-CM | POA: Diagnosis present

## 2022-10-23 DIAGNOSIS — E11621 Type 2 diabetes mellitus with foot ulcer: Secondary | ICD-10-CM | POA: Diagnosis not present

## 2022-10-23 DIAGNOSIS — L03119 Cellulitis of unspecified part of limb: Secondary | ICD-10-CM | POA: Diagnosis not present

## 2022-10-23 DIAGNOSIS — S81809A Unspecified open wound, unspecified lower leg, initial encounter: Secondary | ICD-10-CM | POA: Diagnosis not present

## 2022-10-23 DIAGNOSIS — H543 Unqualified visual loss, both eyes: Secondary | ICD-10-CM | POA: Diagnosis not present

## 2022-10-23 DIAGNOSIS — I10 Essential (primary) hypertension: Secondary | ICD-10-CM | POA: Diagnosis present

## 2022-10-23 DIAGNOSIS — E1165 Type 2 diabetes mellitus with hyperglycemia: Secondary | ICD-10-CM | POA: Diagnosis present

## 2022-10-23 DIAGNOSIS — L97919 Non-pressure chronic ulcer of unspecified part of right lower leg with unspecified severity: Secondary | ICD-10-CM | POA: Diagnosis present

## 2022-10-23 DIAGNOSIS — F419 Anxiety disorder, unspecified: Secondary | ICD-10-CM | POA: Diagnosis present

## 2022-10-23 DIAGNOSIS — F418 Other specified anxiety disorders: Secondary | ICD-10-CM | POA: Diagnosis not present

## 2022-10-23 DIAGNOSIS — I739 Peripheral vascular disease, unspecified: Secondary | ICD-10-CM | POA: Diagnosis not present

## 2022-10-23 DIAGNOSIS — E782 Mixed hyperlipidemia: Secondary | ICD-10-CM | POA: Diagnosis present

## 2022-10-23 DIAGNOSIS — L97929 Non-pressure chronic ulcer of unspecified part of left lower leg with unspecified severity: Secondary | ICD-10-CM | POA: Diagnosis present

## 2022-10-23 DIAGNOSIS — L97909 Non-pressure chronic ulcer of unspecified part of unspecified lower leg with unspecified severity: Secondary | ICD-10-CM | POA: Diagnosis present

## 2022-10-23 DIAGNOSIS — E11622 Type 2 diabetes mellitus with other skin ulcer: Secondary | ICD-10-CM | POA: Diagnosis present

## 2022-10-23 DIAGNOSIS — Z8673 Personal history of transient ischemic attack (TIA), and cerebral infarction without residual deficits: Secondary | ICD-10-CM | POA: Diagnosis not present

## 2022-10-23 DIAGNOSIS — F03A3 Unspecified dementia, mild, with mood disturbance: Secondary | ICD-10-CM | POA: Diagnosis present

## 2022-10-23 DIAGNOSIS — J45909 Unspecified asthma, uncomplicated: Secondary | ICD-10-CM | POA: Diagnosis present

## 2022-10-23 DIAGNOSIS — H53123 Transient visual loss, bilateral: Secondary | ICD-10-CM | POA: Diagnosis present

## 2022-10-23 DIAGNOSIS — Z79899 Other long term (current) drug therapy: Secondary | ICD-10-CM | POA: Diagnosis not present

## 2022-10-23 DIAGNOSIS — Z888 Allergy status to other drugs, medicaments and biological substances status: Secondary | ICD-10-CM | POA: Diagnosis not present

## 2022-10-23 DIAGNOSIS — I69354 Hemiplegia and hemiparesis following cerebral infarction affecting left non-dominant side: Secondary | ICD-10-CM | POA: Diagnosis not present

## 2022-10-23 DIAGNOSIS — Z66 Do not resuscitate: Secondary | ICD-10-CM | POA: Diagnosis present

## 2022-10-23 DIAGNOSIS — Z7982 Long term (current) use of aspirin: Secondary | ICD-10-CM | POA: Diagnosis not present

## 2022-10-23 DIAGNOSIS — M545 Low back pain, unspecified: Secondary | ICD-10-CM | POA: Diagnosis present

## 2022-10-23 DIAGNOSIS — S81802A Unspecified open wound, left lower leg, initial encounter: Secondary | ICD-10-CM | POA: Diagnosis not present

## 2022-10-23 DIAGNOSIS — I87333 Chronic venous hypertension (idiopathic) with ulcer and inflammation of bilateral lower extremity: Secondary | ICD-10-CM | POA: Diagnosis present

## 2022-10-23 LAB — C-REACTIVE PROTEIN: CRP: 3.8 mg/dL — ABNORMAL HIGH (ref <1.0)

## 2022-10-23 LAB — LACTIC ACID, PLASMA: Lactic Acid, Venous: 1.8 mmol/L (ref 0.5–1.9)

## 2022-10-23 LAB — GLUCOSE, CAPILLARY
Glucose-Capillary: 218 mg/dL — ABNORMAL HIGH (ref 70–99)
Glucose-Capillary: 257 mg/dL — ABNORMAL HIGH (ref 70–99)
Glucose-Capillary: 156 mg/dL — ABNORMAL HIGH (ref 70–99)
Glucose-Capillary: 146 mg/dL — ABNORMAL HIGH (ref 70–99)

## 2022-10-23 LAB — HEMOGLOBIN A1C
Hgb A1c MFr Bld: 8.1 % — ABNORMAL HIGH (ref 4.8–5.6)
Mean Plasma Glucose: 185.77 mg/dL

## 2022-10-23 LAB — SEDIMENTATION RATE: Sed Rate: 90 mm/hr — ABNORMAL HIGH (ref 0–22)

## 2022-10-23 MED ORDER — AQUAPHOR EX OINT
TOPICAL_OINTMENT | Freq: Every day | CUTANEOUS | Status: DC
  Filled 2022-10-23: qty 50

## 2022-10-23 MED ORDER — ACETAMINOPHEN 325 MG PO TABS
650.0000 mg | ORAL_TABLET | Freq: Four times a day (QID) | ORAL | Status: DC | PRN
  Administered 2022-10-23 (×2): 650 mg via ORAL
  Filled 2022-10-23 (×3): qty 2

## 2022-10-23 MED ORDER — ENOXAPARIN SODIUM 40 MG/0.4ML IJ SOSY
40.0000 mg | PREFILLED_SYRINGE | INTRAMUSCULAR | Status: DC
  Administered 2022-10-23 – 2022-10-28 (×6): 40 mg via SUBCUTANEOUS
  Filled 2022-10-23 (×6): qty 0.4

## 2022-10-23 MED ORDER — ALBUTEROL SULFATE (2.5 MG/3ML) 0.083% IN NEBU
2.5000 mg | INHALATION_SOLUTION | Freq: Four times a day (QID) | RESPIRATORY_TRACT | Status: DC | PRN
  Administered 2022-10-25: 2.5 mg via RESPIRATORY_TRACT
  Filled 2022-10-23: qty 3

## 2022-10-23 MED ORDER — LORAZEPAM 1 MG PO TABS
1.0000 mg | ORAL_TABLET | Freq: Once | ORAL | Status: AC | PRN
  Administered 2022-10-25: 1 mg via ORAL
  Filled 2022-10-23: qty 1

## 2022-10-23 MED ORDER — ACETAMINOPHEN 650 MG RE SUPP: 650.0000 mg | Freq: Four times a day (QID) | RECTAL | Status: DC | PRN

## 2022-10-23 MED ORDER — INSULIN ASPART 100 UNIT/ML IJ SOLN: 0.0000 [IU] | Freq: Every day | INTRAMUSCULAR | Status: DC

## 2022-10-23 MED ORDER — INSULIN ASPART 100 UNIT/ML IJ SOLN
0.0000 [IU] | Freq: Three times a day (TID) | INTRAMUSCULAR | Status: DC
  Administered 2022-10-23: 5 [IU] via SUBCUTANEOUS
  Administered 2022-10-23: 8 [IU] via SUBCUTANEOUS
  Administered 2022-10-23 – 2022-10-25 (×5): 3 [IU] via SUBCUTANEOUS
  Administered 2022-10-26: 2 [IU] via SUBCUTANEOUS
  Administered 2022-10-26: 5 [IU] via SUBCUTANEOUS
  Administered 2022-10-26: 2 [IU] via SUBCUTANEOUS
  Administered 2022-10-27 – 2022-10-29 (×7): 3 [IU] via SUBCUTANEOUS

## 2022-10-23 MED ORDER — MEDIHONEY WOUND/BURN DRESSING EX PSTE
1.0000 | PASTE | Freq: Every day | CUTANEOUS | Status: DC
  Administered 2022-10-23: 1 via TOPICAL
  Filled 2022-10-23 (×2): qty 44

## 2022-10-23 NOTE — Consult Note (Addendum)
WOC Nurse Consult Note: patient with longstanding history of venous stasis ulcers and lymphedema followed outpatient by [facility] last seen back in 04/2022; per their note has failed many treatment modalities and these wounds will receive palliative care going forward.  Patient states dry dressings are being utilized at home  Reason for Consult:bilateral lower extremity wounds  Wound type: venous  Pressure Injury POA: NA  Measurement: 1.  L medial lower leg 3 cms x 2 cms 90% yellow black 10% red moist 2.  L dorsal foot 6 cm x 6 cm 100% dry yellow crusted  3.  L medial leg 10 cm x 8 cm full thickness 80% dry yellow crust 20% pink dry  4.  R anterior lower leg (extending onto foot) 4 cm x 6 cm 80% dry yellow crust 20% pink dry  5  R medial leg 5 cm x 4 cm pink dry  Drainage (amount, consistency, odor)  all wounds dry other than L medial lower leg which is draining moderate amount serosanguinous fluid  Periwound: dry scaly thickened skin  Dressing procedure/placement/frequency: Clean bilateral lower legs with soap and water, dry thoroughly.  Apply Medihoney to L medial lower leg (draining wound) and dry gauze.  Apply Aquaphor to bilateral legs and feet (DO NOT PLACE IN BETWEEN TOES), cover with ABD pad to L medial wound then wrap bilateral legs with Kerlix roll gauze beginning just above toes and ending right below knees.  May secure with Ace bandage in same fashion as Kerlix.    POC discussed with patient and bedside nurse.  WOC team will not follow at this time. Re-consult if further needs arise.   Thank you,    Priscella Mann MSN, RN-BC, [employer] [phone]

## 2022-10-23 NOTE — Plan of Care (Signed)
Problem: Clinical Measurements: Goal: Ability to avoid or minimize complications of infection will improve 10/23/2022 2016 by Charma Igo, LPN Outcome: Progressing 10/23/2022 0646 by Charma Igo, LPN Outcome: Progressing   Problem: Skin Integrity: Goal: Skin integrity will improve 10/23/2022 2016 by Charma Igo, LPN Outcome: Progressing 10/23/2022 0646 by Charma Igo, LPN Outcome: Progressing   Problem: Education: Goal: Ability to describe self-care measures that may prevent or decrease complications (Diabetes Survival Skills Education) will improve 10/23/2022 2016 by Charma Igo, LPN Outcome: Progressing 10/23/2022 0646 by Charma Igo, LPN Outcome: Progressing Goal: Individualized Educational Video(s) 10/23/2022 2016 by Charma Igo, LPN Outcome: Progressing 10/23/2022 0646 by Charma Igo, LPN Outcome: Progressing   Problem: Coping: Goal: Ability to adjust to condition or change in health will improve 10/23/2022 2016 by Charma Igo, LPN Outcome: Progressing 10/23/2022 0646 by Charma Igo, LPN Outcome: Progressing   Problem: Fluid Volume: Goal: Ability to maintain a balanced intake and output will improve 10/23/2022 2016 by Charma Igo, LPN Outcome: Progressing 10/23/2022 0646 by Charma Igo, LPN Outcome: Progressing   Problem: Health Behavior/Discharge Planning: Goal: Ability to identify and utilize available resources and services will improve 10/23/2022 2016 by Charma Igo, LPN Outcome: Progressing 10/23/2022 0646 by Charma Igo, LPN Outcome: Progressing Goal: Ability to manage health-related needs will improve 10/23/2022 2016 by Charma Igo, LPN Outcome: Progressing 10/23/2022 0646 by Charma Igo, LPN Outcome: Progressing   Problem: Metabolic: Goal: Ability to maintain appropriate glucose levels will improve 10/23/2022 2016 by Charma Igo, LPN Outcome: Progressing 10/23/2022 0646 by Charma Igo,  LPN Outcome: Progressing   Problem: Nutritional: Goal: Maintenance of adequate nutrition will improve 10/23/2022 2016 by Charma Igo, LPN Outcome: Progressing 10/23/2022 0646 by Charma Igo, LPN Outcome: Progressing Goal: Progress toward achieving an optimal weight will improve 10/23/2022 2016 by Charma Igo, LPN Outcome: Progressing 10/23/2022 0646 by Charma Igo, LPN Outcome: Progressing   Problem: Skin Integrity: Goal: Risk for impaired skin integrity will decrease 10/23/2022 2016 by Charma Igo, LPN Outcome: Progressing 10/23/2022 0646 by Charma Igo, LPN Outcome: Progressing   Problem: Tissue Perfusion: Goal: Adequacy of tissue perfusion will improve 10/23/2022 2016 by Charma Igo, LPN Outcome: Progressing 10/23/2022 0646 by Charma Igo, LPN Outcome: Progressing   Problem: Education: Goal: Knowledge of General Education information will improve Description: Including pain rating scale, medication(s)/side effects and non-pharmacologic comfort measures 10/23/2022 2016 by Charma Igo, LPN Outcome: Progressing 10/23/2022 0646 by Charma Igo, LPN Outcome: Progressing   Problem: Health Behavior/Discharge Planning: Goal: Ability to manage health-related needs will improve 10/23/2022 2016 by Charma Igo, LPN Outcome: Progressing 10/23/2022 0646 by Charma Igo, LPN Outcome: Progressing   Problem: Clinical Measurements: Goal: Ability to maintain clinical measurements within normal limits will improve 10/23/2022 2016 by Charma Igo, LPN Outcome: Progressing 10/23/2022 0646 by Charma Igo, LPN Outcome: Progressing Goal: Will remain free from infection 10/23/2022 2016 by Charma Igo, LPN Outcome: Progressing 10/23/2022 0646 by Charma Igo, LPN Outcome: Progressing Goal: Diagnostic test results will improve 10/23/2022 2016 by Charma Igo, LPN Outcome: Progressing 10/23/2022 0646 by Charma Igo, LPN Outcome:  Progressing Goal: Respiratory complications will improve 10/23/2022 2016 by Charma Igo, LPN Outcome: Progressing 10/23/2022 0646 by Charma Igo, LPN Outcome: Progressing Goal: Cardiovascular complication will be avoided 10/23/2022 2016 by Charma Igo, LPN Outcome: Progressing 10/23/2022 0646 by Valetta Fuller  K, LPN Outcome: Progressing   Problem: Activity: Goal: Risk for activity intolerance will decrease 10/23/2022 2016 by Charma Igo, LPN Outcome: Progressing 10/23/2022 0646 by Charma Igo, LPN Outcome: Progressing   Problem: Nutrition: Goal: Adequate nutrition will be maintained 10/23/2022 2016 by Charma Igo, LPN Outcome: Progressing 10/23/2022 0646 by Charma Igo, LPN Outcome: Progressing   Problem: Coping: Goal: Level of anxiety will decrease 10/23/2022 2016 by Charma Igo, LPN Outcome: Progressing 10/23/2022 0646 by Charma Igo, LPN Outcome: Progressing   Problem: Elimination: Goal: Will not experience complications related to bowel motility 10/23/2022 2016 by Charma Igo, LPN Outcome: Progressing 10/23/2022 0646 by Charma Igo, LPN Outcome: Progressing Goal: Will not experience complications related to urinary retention 10/23/2022 2016 by Charma Igo, LPN Outcome: Progressing 10/23/2022 0646 by Charma Igo, LPN Outcome: Progressing   Problem: Pain Managment: Goal: General experience of comfort will improve 10/23/2022 2016 by Charma Igo, LPN Outcome: Progressing 10/23/2022 0646 by Charma Igo, LPN Outcome: Progressing   Problem: Safety: Goal: Ability to remain free from injury will improve 10/23/2022 2016 by Charma Igo, LPN Outcome: Progressing 10/23/2022 0646 by Charma Igo, LPN Outcome: Progressing   Problem: Skin Integrity: Goal: Risk for impaired skin integrity will decrease 10/23/2022 2016 by Charma Igo, LPN Outcome: Progressing 10/23/2022 0646 by Charma Igo, LPN Outcome:  Progressing

## 2022-10-23 NOTE — Plan of Care (Signed)

## 2022-10-23 NOTE — H&P (Signed)
History and Physical    [name]:[id number] DOB: 01/09/1951 DOA: 10/22/2022  PCP: Salvatore Decent, PA-C  Patient coming from: Home  Chief Complaint: Leg wounds  HPI: [name] is a 72 y.o. female with medical history significant of hypertension, hyperlipidemia, history of stroke, insulin-dependent type 2 diabetes, anxiety, depression, bipolar disorder, osteoarthritis, asthma, chronic lower back pain, gout, mild dementia, chronic bilateral lower extremity wounds, PVD/venous stasis ulcers, morbid obesity presented to the ED for evaluation of bilateral lower extremity ulcers/wounds with increasing drainage.  Vital signs stable.  Labs showing WBC 9.2, hemoglobin 12.5, platelet count 406k, sodium 136, potassium 4.3, chloride 102, bicarb 23, BUN 8, creatinine 0.8, glucose 174, albumin 2.8, lactic acid normal.  X-rays of both feet negative for osteomyelitis. Patient was given IV vancomycin.  TRH called to admit.  Patient reports history of chronic bilateral lower extremity wounds/ulcers for the past year for which a wound care nurse visits her at home twice a week.  She is concerned that over time she is having increasing drainage from her wounds, especially the ulcer on the anterior aspect of her left lower leg.  Not having much pain.  Denies fevers or chills.  She has no other complaints.  Denies cough, shortness of breath, chest pain, nausea, vomiting, or abdominal pain.  Review of Systems:  Review of Systems  All other systems reviewed and are negative.   Past Medical History:  Diagnosis Date   Anxiety    Arthritis    "back, arms, legs" (03/24/2016)   Asthma    Chronic lower back pain    Colonic polyp    last colonoscopy done in 2009 with normal results per medical record   Depressive disorder    Gastric polyp    Gout    has taken allopurinol 300mg  once daily in past   Headache    History of hiatal hernia    Hypertension    Migraine    "none in awhile; might have a  couple/year" (03/24/2016)   Mixed hyperlipidemia    01/2016 Total chol 141, HDL 59, LDL 63, ration 1.1   Osteoarthritis    TIA (transient ischemic attack) 11/2014   Type II diabetes mellitus (HCC)     Past Surgical History:  Procedure Laterality Date   ARTERY BIOPSY Right 08/12/2016   Procedure: BIOPSY TEMPORAL ARTERY;  Surgeon: Sherren Kerns, MD;  Location: [facility] OR;  Service: Vascular;  Laterality: Right;  BIOPSY TEMPORAL ARTERY   BREAST BIOPSY Left ~ 2015   benign   CARPAL TUNNEL RELEASE Bilateral    DILATION AND CURETTAGE OF UTERUS     IR CT HEAD LTD  06/11/2022   IR PERCUTANEOUS ART THROMBECTOMY/INFUSION INTRACRANIAL INC DIAG ANGIO  06/11/2022   IR US GUIDE VASC ACCESS RIGHT  06/11/2022   KNEE ARTHROSCOPY Right 2003   in [facility]   LAPAROSCOPIC CHOLECYSTECTOMY     LOOP RECORDER INSERTION N/A 06/26/2022   Procedure: LOOP RECORDER INSERTION;  Surgeon: Regan Lemming, MD;  Location: [facility] INVASIVE CV LAB;  Service: Cardiovascular;  Laterality: N/A;   RADIOLOGY WITH ANESTHESIA N/A 06/11/2022   Procedure: IR WITH ANESTHESIA;  Surgeon: Radiologist, Medication, MD;  Location: [facility] OR;  Service: Radiology;  Laterality: N/A;   TUBAL LIGATION     VAGINAL HYSTERECTOMY  1982     reports that she has never smoked. She has never used smokeless tobacco. She reports that she does not drink alcohol and does not use drugs.  Allergies  Allergen Reactions  Ace Inhibitors Swelling   Latex Itching and Swelling   Tizanidine Other (See Comments)    Tremors    Ultram [Tramadol] Nausea And Vomiting   Diprivan [Propofol] Itching   Metformin And Related Other (See Comments)    Tremors  Chills    Family History  Problem Relation Age of Onset   Stroke Father    Stroke Brother    Cancer Other    Gout Mother    Hypertension Mother    Arthritis Mother    Asthma Mother    Stroke Brother     Prior to Admission medications   Medication Sig Start Date End Date Taking? Authorizing Provider   acetaminophen (TYLENOL) 325 MG tablet Take 2 tablets (650 mg total) by mouth every 6 (six) hours as needed for mild pain (or Fever >/= 101). 06/28/22   Elgergawy, Leana Roe, MD  aspirin EC 81 MG tablet Take 81 mg by mouth daily. Swallow whole.    [provider]  atenolol (TENORMIN) 25 MG tablet Take 25-50 mg by mouth See admin instructions. 50 mg every morning, 25 mg at bedtime    [provider]  cyclobenzaprine (FLEXERIL) 5 MG tablet Take 5 mg by mouth 3 (three) times daily as needed for muscle spasms. 06/03/22   [provider]  donepezil (ARICEPT) 10 MG tablet Take 10 mg by mouth in the morning.    [provider]  gabapentin (NEURONTIN) 300 MG capsule Take 2 capsules (600 mg total) by mouth 2 (two) times daily. Patient taking differently: Take 300 mg by mouth 2 (two) times daily. 02/08/21   Sabino Dick, DO  insulin glargine (LANTUS) 100 UNIT/ML Solostar Pen Inject 30 Units into the skin every morning. And pen needles 1/day 06/28/22   Elgergawy, Leana Roe, MD  lurasidone (LATUDA) 40 MG TABS tablet Take 1 tablet (40 mg total) by mouth daily with breakfast. 06/16/22   Bobbye Morton, MD  NIFEdipine (PROCARDIA XL/NIFEDICAL-XL) 90 MG 24 hr tablet Take 90 mg by mouth in the morning.    [provider]  ondansetron (ZOFRAN) 4 MG tablet Take 1 tablet (4 mg total) by mouth every 6 (six) hours. 07/16/22   Prosperi, Christian H, PA-C  traZODone (DESYREL) 50 MG tablet Take 1 tablet (50 mg total) by mouth at bedtime and may repeat dose one time if needed. 06/02/22   Bobbye Morton, MD    Physical Exam: Vitals:   10/22/22 2057 10/22/22 2345 10/23/22 0050 10/23/22 0107  BP: (!) 108/58 120/62 116/63 116/63  Pulse: 77 71 69 69  Resp: 16   16  Temp: 98.3 F (36.8 C)     TempSrc: Oral     SpO2: 96% 98% 98% 98%    Physical Exam Vitals reviewed.  Constitutional:      General: She is not in acute distress. HENT:     Head: Normocephalic and atraumatic.   Eyes:     Extraocular Movements: Extraocular movements intact.  Cardiovascular:     Rate and Rhythm: Normal rate and regular rhythm.     Pulses: Normal pulses.  Pulmonary:     Effort: Pulmonary effort is normal. No respiratory distress.     Breath sounds: Wheezing present. No rales.     Comments: Mild end expiratory wheezing Abdominal:     General: Bowel sounds are normal. There is no distension.     Palpations: Abdomen is soft.     Tenderness: There is no abdominal tenderness.  Musculoskeletal:  Cervical back: Normal range of motion.  Skin:    General: Skin is warm and dry.     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  Ulcer on the anterior aspect of the left lower leg with foul-smelling purulent drainage.  Neurological:     General: No focal deficit present.     Mental Status: She is alert and oriented to person, place, and time.          Labs on Admission: I have personally reviewed following labs and imaging studies  CBC: Recent Labs  Lab 10/22/22 1754  WBC 9.2  NEUTROABS 6.0  HGB 12.5  HCT 39.2  MCV 84.7  PLT 406*   Basic Metabolic Panel: Recent Labs  Lab 10/22/22 2130  NA 136  K 4.3  CL 102  CO2 23  GLUCOSE 174*  BUN 8  CREATININE 0.87  CALCIUM 9.6   GFR: CrCl cannot be calculated (Unknown ideal weight.). Liver Function Tests: Recent Labs  Lab 10/22/22 2130  AST 19  ALT 11  ALKPHOS 90  BILITOT 0.5  PROT 8.5*  ALBUMIN 2.8*   No results for input(s): "LIPASE", "AMYLASE" in the last 168 hours. No results for input(s): "AMMONIA" in the last 168 hours. Coagulation Profile: No results for input(s): "INR", "PROTIME" in the last 168 hours. Cardiac Enzymes: No results for input(s): "CKTOTAL", "CKMB", "CKMBINDEX", "TROPONINI" in the last 168 hours. BNP (last 3 results) No results for input(s): "PROBNP" in the last 8760 hours. HbA1C: No results for input(s): "HGBA1C" in the last 72  hours. CBG: Recent Labs  Lab 10/22/22 2236  GLUCAP 160*   Lipid Profile: No results for input(s): "CHOL", "HDL", "LDLCALC", "TRIG", "CHOLHDL", "LDLDIRECT" in the last 72 hours. Thyroid Function Tests: No results for input(s): "TSH", "T4TOTAL", "FREET4", "T3FREE", "THYROIDAB" in the last 72 hours. Anemia Panel: No results for input(s): "VITAMINB12", "FOLATE", "FERRITIN", "TIBC", "IRON", "RETICCTPCT" in the last 72 hours. Urine analysis:    Component Value Date/Time   COLORURINE YELLOW 06/25/2022 1230   APPEARANCEUR CLEAR 06/25/2022 1230   LABSPEC 1.005 06/25/2022 1230   PHURINE 7.0 06/25/2022 1230   GLUCOSEU NEGATIVE 06/25/2022 1230   HGBUR NEGATIVE 06/25/2022 1230   BILIRUBINUR NEGATIVE 06/25/2022 1230   KETONESUR NEGATIVE 06/25/2022 1230   PROTEINUR NEGATIVE 06/25/2022 1230   NITRITE POSITIVE (A) 06/25/2022 1230   LEUKOCYTESUR NEGATIVE 06/25/2022 1230    Radiological Exams on Admission: DG Foot Complete Right  Result Date: 10/22/2022 CLINICAL DATA:  Diabetic foot wound EXAM: RIGHT FOOT COMPLETE - 3+ VIEW COMPARISON:  10/15/2021 FINDINGS: Osseous demineralization. No fracture or malalignment. No definitive osseous destructive change. Small plantar calcaneal spur. Generalized soft tissue swelling. Possible ulcer dorsal aspect of the foot. Possible ulcer posterior heel. No soft tissue gas. IMPRESSION: 1. Osseous demineralization. No acute osseous abnormality. 2. Generalized soft tissue swelling. Possible ulcer dorsal aspect of the foot and posterior heel. Electronically Signed   By: Jasmine Pang M.D.   On: 10/22/2022 22:09   DG Foot Complete Left  Result Date: 10/22/2022 CLINICAL DATA:  Diabetic wound. Pain. EXAM: LEFT FOOT - COMPLETE 3+ VIEW COMPARISON:  Radiograph 10/15/2021 FINDINGS: Bony under mineralization. Hammertoe deformity of the toes. There is hallux valgus with degenerative change of the first metatarsal phalangeal joint. Erosion is noted about the medial aspect of the  first metatarsal head, chronic. Plantar calcaneal spur and Achilles tendon enthesophyte. Dorsal soft tissue thickening. There is skin and soft tissue irregularity about the dorsum of the mid and forefoot.  Skin irregularity posterior to the calcaneus. No evidence of soft tissue gas or radiopaque foreign body. Multiple soft tissue calcifications involving the distal lower leg. IMPRESSION: 1. Soft tissue irregularity about the dorsum of the mid and forefoot and skin irregularity posterior to the calcaneus, suspicious for soft tissue ulcer. No radiographic evidence of osteomyelitis. 2. Hallux valgus with degenerative change of the first metatarsophalangeal joint. Chronic erosion about the first metatarsal head. Electronically Signed   By: Narda Rutherford M.D.   On: 10/22/2022 22:05    Assessment and Plan  Chronic bilateral lower extremity ulcers/wounds with concern for infection Patient with history of chronic bilateral lower extremity ulcers/wounds presenting due to concern for increasing drainage from her wounds, especially the ulcer on the anterior aspect of her left lower leg with foul-smelling purulent drainage.  No fever, tachycardia, leukocytosis, or lactic acidosis to suggest sepsis.  X-rays of both feet negative for osteomyelitis.  Check ESR and CRP.  Will continue IV vancomycin and wound care consulted.  On exam it is difficult to palpate pedal pulses bilaterally given skin changes, however, both feet are warm to touch and no pain or discoloration to suggest acute limb ischemia.  Bilateral ABIs ordered.  Insulin-dependent type 2 diabetes Last A1c 9.5 in December 2023.  Repeat A1c ordered.  Moderate sliding scale insulin ACHS.  Continue home basal insulin after pharmacy med rec is done.  Asthma She has mild wheezing on exam but no signs of respiratory distress and not hypoxic.  Albuterol neb PRN.  History of mild dementia/memory loss Patient is currently AAO x 4 and answering questions  appropriately.  Hypertension: Blood pressure stable. Hyperlipidemia History of stroke Anxiety, depression, bipolar disorder Gout Pharmacy med rec pending.  DVT prophylaxis: Lovenox Code Status: DNR/DNI (discussed with the patient) Family Communication: No family available at this time. Level of care: Med-Surg Admission status: It is my clinical opinion that referral for OBSERVATION is reasonable and necessary in this patient based on the above information provided. The aforementioned taken together are felt to place the patient at high risk for further clinical deterioration. However, it is anticipated that the patient may be medically stable for discharge from the hospital within 24 to 48 hours.   John Giovanni MD [facility]  If 7PM-7AM, please contact night-coverage [email or web address]  10/23/2022, 2:01 AM

## 2022-10-23 NOTE — Progress Notes (Signed)
   Patient seen and examined at bedside, patient admitted after midnight, please see earlier detailed admission note by John Giovanni, MD. Briefly, patient presented secondary to draining from chronic leg wound.  Subjective: Patient reports no specific issues overnight.   BP 124/73 (BP Location: Right Arm)   Pulse (!) 53   Temp 98 F (36.7 C) (Oral)   Resp 17   SpO2 99%   General exam: Appears calm and comfortable Respiratory system: Clear to auscultation. Respiratory effort normal. Cardiovascular system: S1 & S2 heard, RRR. No murmurs, rubs, gallops or clicks. Gastrointestinal system: Abdomen is nondistended, soft and nontender. Normal bowel sounds heard. Central nervous system: Alert and oriented. No focal neurological deficits. Musculoskeletal: No edema. No calf tenderness Skin: Skin appears woody with evidence of chronic wounds in various stages of healing. One ulcer located above left ankle noted to have scant drainage and no surrounding erythema or purulent drainage. Psychiatry: Judgement and insight appear normal. Mood & affect appropriate.   Brief assessment/Plan:  Chronic bilateral lower extremity wounds No obvious evidence of infection. One ulcer appears to be amenable to debridement. Orthopedic surgery consulted. X-rays negative for osteomyelitis. -MRI left ankle -Orthopedic surgery recommendations -Discontinue antibiotics -Wound nurse recommendations (5/9): Clean bilateral lower legs with soap and water, dry thoroughly. Apply Medihoney to L medial lower leg (draining wound) and dry gauze. Apply Aquaphor to bilateral legs and feet (DO NOT PLACE IN BETWEEN TOES), cover with ABD pad to L medial wound then wrap bilateral legs with Kerlix roll gauze beginning just above toes and ending right below knees. May secure with Ace bandage in same fashion as Kerlix.    Diabetes mellitus type 2 Asthma Hypertension Hyperlipidemia History of stroke Anxiety Depression Bipolar  disorder Per H&P  Family communication: None DVT prophylaxis: Lovenox Disposition: Discharge home likely in 24-48 hours pending orthopedic surgery recommendations  Jacquelin Hawking, MD [facility] 10/23/2022, 9:48 AM

## 2022-10-23 NOTE — Anesthesia Preprocedure Evaluation (Addendum)
Anesthesia Evaluation  Patient identified by MRN, date of birth, ID band Patient awake    Reviewed: Allergy & Precautions, NPO status , Patient's Chart, lab work & pertinent test results  Airway Mallampati: II  TM Distance: >3 FB Neck ROM: Full    Dental no notable dental hx. (+) Teeth Intact, Dental Advisory Given   Pulmonary asthma    Pulmonary exam normal breath sounds clear to auscultation       Cardiovascular hypertension, Pt. on medications + Peripheral Vascular Disease  Normal cardiovascular exam Rhythm:Regular Rate:Normal  06/25/2022 TTE  1. Left ventricular ejection fraction, by estimation, is 65 to 70%. The  left ventricle has normal function. The left ventricle has no regional  wall motion abnormalities. There is moderate left ventricular hypertrophy.  Left ventricular diastolic  parameters are consistent with Grade I diastolic dysfunction (impaired  relaxation).   2. Right ventricular systolic function is normal. The right ventricular  size is normal. There is normal pulmonary artery systolic pressure. The  estimated right ventricular systolic pressure is 24.3 mmHg.   3. The mitral valve is normal in structure. Trivial mitral valve  regurgitation. No evidence of mitral stenosis.   4. The aortic valve was not well visualized. There is mild calcification  of the aortic valve. Aortic valve regurgitation is not visualized. No  aortic stenosis is present.   5. Increased flow velocities may be secondary to anemia, thyrotoxicosis,  hyperdynamic or high flow state.   6. The inferior vena cava is normal in size with greater than 50%  respiratory variability, suggesting right atrial pressure of 3 mmHg.   7. Cannot exclude a small PFO.     Neuro/Psych  Headaches PSYCHIATRIC DISORDERS Anxiety Depression    TIACVA, Residual Symptoms    GI/Hepatic Neg liver ROS, hiatal hernia,,,  Endo/Other  diabetes, Type 2     Renal/GU Lab Results      Component                Value               Date                      CREATININE               0.87                10/22/2022                BUN                      8                   10/22/2022                NA                       136                 10/22/2022                K                        4.3                 10/22/2022                CL  102                 10/22/2022                CO2                      23                  10/22/2022                Musculoskeletal  (+) Arthritis ,    Abdominal   Peds  Hematology Lab Results      Component                Value               Date                      WBC                      9.2                 10/22/2022                HGB                      12.5                10/22/2022                HCT                      39.2                10/22/2022                MCV                      84.7                10/22/2022                PLT                      406 (H)             10/22/2022              Anesthesia Other Findings All: latex, ace inhibitors, ultram, tizantidine  Reproductive/Obstetrics                             Anesthesia Physical Anesthesia Plan  ASA: 3  Anesthesia Plan: General   Post-op Pain Management: Regional block*, Minimal or no pain anticipated and Precedex   Induction: Intravenous  PONV Risk Score and Plan: 4 or greater and Treatment may vary due to age or medical condition and Ondansetron  Airway Management Planned: LMA  Additional Equipment: None  Intra-op Plan:   Post-operative Plan:   Informed Consent: I have reviewed the patients History and Physical, chart, labs and discussed the procedure including the risks, benefits and alternatives for the proposed anesthesia with the patient or authorized representative who has indicated his/her understanding and acceptance.     Dental advisory  given  Plan Discussed with: CRNA  Anesthesia Plan Comments:  Anesthesia Quick Evaluation  

## 2022-10-23 NOTE — ED Notes (Signed)
ED TO INPATIENT HANDOFF REPORT  ED Nurse Name and Phone #: Benson Norway, RN  S Name/Age/Gender [name] 72 y.o. female Room/Bed: 041C/041C  Code Status   Code Status: DNR  Home/SNF/Other Home Patient oriented to: self, place, time, and situation Is this baseline? Yes   Triage Complete: Triage complete  Chief Complaint Non-healing wound of lower extremity [S81.809A]  Triage Note Pt here with diabetic ulcers to bil lower extremities. Sent here from wound care for evaluation.   Allergies Allergies  Allergen Reactions   Ace Inhibitors Swelling   Latex Itching and Swelling   Tizanidine Other (See Comments)    Tremors    Ultram [Tramadol] Nausea And Vomiting   Diprivan [Propofol] Itching   Metformin And Related Other (See Comments)    Tremors  Chills    Level of Care/Admitting Diagnosis ED Disposition     ED Disposition  Admit   Condition  --   Comment  Hospital Area: [facility] [[id number]]  Level of Care: Med-Surg [16]  May place patient in observation at [facility] or [facility] if equivalent level of care is available:: Yes  Covid Evaluation: Asymptomatic - no recent exposure (last 10 days) testing not required  Diagnosis: Non-healing wound of lower extremity [[id number]]  Admitting Physician: John Giovanni [[id number]]  Attending Physician: John Giovanni [[id number]]          B Medical/Surgery History Past Medical History:  Diagnosis Date   Anxiety    Arthritis    "back, arms, legs" (03/24/2016)   Asthma    Chronic lower back pain    Colonic polyp    last colonoscopy done in 2009 with normal results per medical record   Depressive disorder    Gastric polyp    Gout    has taken allopurinol 300mg  once daily in past   Headache    History of hiatal hernia    Hypertension    Migraine    "none in awhile; might have a couple/year" (03/24/2016)   Mixed hyperlipidemia    01/2016 Total chol 141, HDL 59, LDL 63, ration 1.1    Osteoarthritis    TIA (transient ischemic attack) 11/2014   Type II diabetes mellitus (HCC)    Past Surgical History:  Procedure Laterality Date   ARTERY BIOPSY Right 08/12/2016   Procedure: BIOPSY TEMPORAL ARTERY;  Surgeon: Sherren Kerns, MD;  Location: [facility] OR;  Service: Vascular;  Laterality: Right;  BIOPSY TEMPORAL ARTERY   BREAST BIOPSY Left ~ 2015   benign   CARPAL TUNNEL RELEASE Bilateral    DILATION AND CURETTAGE OF UTERUS     IR CT HEAD LTD  06/11/2022   IR PERCUTANEOUS ART THROMBECTOMY/INFUSION INTRACRANIAL INC DIAG ANGIO  06/11/2022   IR US GUIDE VASC ACCESS RIGHT  06/11/2022   KNEE ARTHROSCOPY Right 2003   in [facility]   LAPAROSCOPIC CHOLECYSTECTOMY     LOOP RECORDER INSERTION N/A 06/26/2022   Procedure: LOOP RECORDER INSERTION;  Surgeon: Regan Lemming, MD;  Location: [facility] INVASIVE CV LAB;  Service: Cardiovascular;  Laterality: N/A;   RADIOLOGY WITH ANESTHESIA N/A 06/11/2022   Procedure: IR WITH ANESTHESIA;  Surgeon: Radiologist, Medication, MD;  Location: [facility] OR;  Service: Radiology;  Laterality: N/A;   TUBAL LIGATION     VAGINAL HYSTERECTOMY  1982     A IV Location/Drains/Wounds Patient Lines/Drains/Airways Status     Active Line/Drains/Airways     Name Placement date Placement time Site Days   Peripheral IV 10/22/22 20 G  Anterior;Left;Proximal Antecubital 10/22/22  2127  Antecubital  1   External Urinary Catheter 06/26/22  1905  --  119   Wound / Incision (Open or Dehisced) 06/11/22 Venous stasis ulcer Foot Anterior;Right 06/11/22  1428  Foot  134   Wound / Incision (Open or Dehisced) 06/11/22 Venous stasis ulcer Ankle Anterior;Right 06/11/22  1630  Ankle  134   Wound / Incision (Open or Dehisced) 06/11/22 Venous stasis ulcer Foot Anterior;Left 06/11/22  1428  Foot  134   Wound / Incision (Open or Dehisced) 06/11/22 Venous stasis ulcer Foot Anterior;Left;Medial 06/11/22  1428  Foot  134   Wound / Incision (Open or Dehisced) 06/12/22 Venous stasis ulcer;Diabetic ulcer  Ankle Left;Posterior 06/12/22  0800  Ankle  133            Intake/Output Last 24 hours No intake or output data in the 24 hours ending 10/23/22 0865  Labs/Imaging Results for orders placed or performed during the hospital encounter of 10/22/22 (from the past 48 hour(s))  CBC with Differential     Status: Abnormal   Collection Time: 10/22/22  5:54 PM  Result Value Ref Range   WBC 9.2 4.0 - 10.5 K/uL   RBC 4.63 3.87 - 5.11 MIL/uL   Hemoglobin 12.5 12.0 - 15.0 g/dL   HCT 78.4 69.6 - 29.5 %   MCV 84.7 80.0 - 100.0 fL   MCH 27.0 26.0 - 34.0 pg   MCHC 31.9 30.0 - 36.0 g/dL   RDW 28.4 (H) 13.2 - 44.0 %   Platelets 406 (H) 150 - 400 K/uL   nRBC 0.0 0.0 - 0.2 %   Neutrophils Relative % 66 %   Neutro Abs 6.0 1.7 - 7.7 K/uL   Lymphocytes Relative 23 %   Lymphs Abs 2.1 0.7 - 4.0 K/uL   Monocytes Relative 7 %   Monocytes Absolute 0.6 0.1 - 1.0 K/uL   Eosinophils Relative 4 %   Eosinophils Absolute 0.3 0.0 - 0.5 K/uL   Basophils Relative 0 %   Basophils Absolute 0.0 0.0 - 0.1 K/uL   Immature Granulocytes 0 %   Abs Immature Granulocytes 0.03 0.00 - 0.07 K/uL    Comment: Performed at [facility], [address], [place], [place] [address]  Lactic acid, plasma     Status: None   Collection Time: 10/22/22  9:20 PM  Result Value Ref Range   Lactic Acid, Venous 1.3 0.5 - 1.9 mmol/L    Comment: Performed at [facility], [address] [place], [place] [address]  Comprehensive metabolic panel     Status: Abnormal   Collection Time: 10/22/22  9:30 PM  Result Value Ref Range   Sodium 136 135 - 145 mmol/L   Potassium 4.3 3.5 - 5.1 mmol/L   Chloride 102 98 - 111 mmol/L   CO2 23 22 - 32 mmol/L   Glucose, Bld 174 (H) 70 - 99 mg/dL    Comment: Glucose reference range applies only to samples taken after fasting for at least 8 hours.   BUN 8 8 - 23 mg/dL   Creatinine, Ser 4.03 0.44 - 1.00 mg/dL   Calcium 9.6 8.9 - 47.4 mg/dL   Total Protein 8.5 (H) 6.5 - 8.1 g/dL   Albumin  2.8 (L) 3.5 - 5.0 g/dL   AST 19 15 - 41 U/L   ALT 11 0 - 44 U/L   Alkaline Phosphatase 90 38 - 126 U/L   Total Bilirubin 0.5 0.3 - 1.2  mg/dL   GFR, Estimated >16 >10 mL/min    Comment: (NOTE) Calculated using the CKD-EPI Creatinine Equation (2021)    Anion gap 11 5 - 15    Comment: Performed at [facility], [address], [place], [place] [address]  CBG monitoring, ED     Status: Abnormal   Collection Time: 10/22/22 10:36 PM  Result Value Ref Range   Glucose-Capillary 160 (H) 70 - 99 mg/dL    Comment: Glucose reference range applies only to samples taken after fasting for at least 8 hours.   Comment 1 Notify RN    Comment 2 Document in Chart    DG Foot Complete Right  Result Date: 10/22/2022 CLINICAL DATA:  Diabetic foot wound EXAM: RIGHT FOOT COMPLETE - 3+ VIEW COMPARISON:  10/15/2021 FINDINGS: Osseous demineralization. No fracture or malalignment. No definitive osseous destructive change. Small plantar calcaneal spur. Generalized soft tissue swelling. Possible ulcer dorsal aspect of the foot. Possible ulcer posterior heel. No soft tissue gas. IMPRESSION: 1. Osseous demineralization. No acute osseous abnormality. 2. Generalized soft tissue swelling. Possible ulcer dorsal aspect of the foot and posterior heel. Electronically Signed   By: Jasmine Pang M.D.   On: 10/22/2022 22:09   DG Foot Complete Left  Result Date: 10/22/2022 CLINICAL DATA:  Diabetic wound. Pain. EXAM: LEFT FOOT - COMPLETE 3+ VIEW COMPARISON:  Radiograph 10/15/2021 FINDINGS: Bony under mineralization. Hammertoe deformity of the toes. There is hallux valgus with degenerative change of the first metatarsal phalangeal joint. Erosion is noted about the medial aspect of the first metatarsal head, chronic. Plantar calcaneal spur and Achilles tendon enthesophyte. Dorsal soft tissue thickening. There is skin and soft tissue irregularity about the dorsum of the mid and forefoot. Skin irregularity posterior to the calcaneus. No  evidence of soft tissue gas or radiopaque foreign body. Multiple soft tissue calcifications involving the distal lower leg. IMPRESSION: 1. Soft tissue irregularity about the dorsum of the mid and forefoot and skin irregularity posterior to the calcaneus, suspicious for soft tissue ulcer. No radiographic evidence of osteomyelitis. 2. Hallux valgus with degenerative change of the first metatarsophalangeal joint. Chronic erosion about the first metatarsal head. Electronically Signed   By: Narda Rutherford M.D.   On: 10/22/2022 22:05    Pending Labs Unresulted Labs (From admission, onward)     Start     Ordered   10/23/22 0248  Hemoglobin A1c  Once,   R       Comments: To assess prior glycemic control    10/23/22 0247   10/23/22 0248  Sedimentation rate  Once,   R        10/23/22 0247   10/23/22 0248  C-reactive protein  Once,   R        10/23/22 0247   10/22/22 2112  Lactic acid, plasma  Now then every 2 hours,   R      10/22/22 2111            Vitals/Pain Today's Vitals   10/22/22 2057 10/22/22 2345 10/23/22 0050 10/23/22 0107  BP: (!) 108/58 120/62 116/63 116/63  Pulse: 77 71 69 69  Resp: 16   16  Temp: 98.3 F (36.8 C)     TempSrc: Oral     SpO2: 96% 98% 98% 98%  PainSc:        Isolation Precautions No active isolations  Medications Medications  vancomycin (VANCOREADY) IVPB 1500 mg/300 mL (has no administration in time range)  enoxaparin (LOVENOX) injection 40 mg (  has no administration in time range)  acetaminophen (TYLENOL) tablet 650 mg (has no administration in time range)    Or  acetaminophen (TYLENOL) suppository 650 mg (has no administration in time range)  albuterol (PROVENTIL) (2.5 MG/3ML) 0.083% nebulizer solution 2.5 mg (has no administration in time range)  insulin aspart (novoLOG) injection 0-15 Units (has no administration in time range)  insulin aspart (novoLOG) injection 0-5 Units (has no administration in time range)  vancomycin (VANCOREADY) IVPB 2000  mg/400 mL (0 mg Intravenous Stopped 10/23/22 0130)    Mobility non-ambulatory     Focused Assessments See chart, cellulitis of bilateral lower extremities and diabetic complications.   R Recommendations: See Admitting Provider Note  Report given to:   Additional Notes: Cellulitis bilat lower extrem., diabetic ulcers, non ambulatory, A&Ox4.

## 2022-10-23 NOTE — Progress Notes (Signed)
ABI w/ TBI study completed.   Please see CV Proc for preliminary results.   Rishit Burkhalter, RDMS, RVT  

## 2022-10-23 NOTE — Plan of Care (Signed)
  Problem: Clinical Measurements: Goal: Ability to avoid or minimize complications of infection will improve Outcome: Progressing   Problem: Skin Integrity: Goal: Skin integrity will improve Outcome: Progressing   Problem: Education: Goal: Ability to describe self-care measures that may prevent or decrease complications (Diabetes Survival Skills Education) will improve Outcome: Progressing Goal: Individualized Educational Video(s) Outcome: Progressing   Problem: Coping: Goal: Ability to adjust to condition or change in health will improve Outcome: Progressing   Problem: Fluid Volume: Goal: Ability to maintain a balanced intake and output will improve Outcome: Progressing   Problem: Health Behavior/Discharge Planning: Goal: Ability to identify and utilize available resources and services will improve Outcome: Progressing Goal: Ability to manage health-related needs will improve Outcome: Progressing   Problem: Metabolic: Goal: Ability to maintain appropriate glucose levels will improve Outcome: Progressing   Problem: Nutritional: Goal: Maintenance of adequate nutrition will improve Outcome: Progressing Goal: Progress toward achieving an optimal weight will improve Outcome: Progressing   Problem: Skin Integrity: Goal: Risk for impaired skin integrity will decrease Outcome: Progressing   Problem: Tissue Perfusion: Goal: Adequacy of tissue perfusion will improve Outcome: Progressing   Problem: Education: Goal: Knowledge of General Education information will improve Description: Including pain rating scale, medication(s)/side effects and non-pharmacologic comfort measures Outcome: Progressing   Problem: Health Behavior/Discharge Planning: Goal: Ability to manage health-related needs will improve Outcome: Progressing

## 2022-10-24 ENCOUNTER — Encounter (HOSPITAL_COMMUNITY)
Admission: EM | Disposition: A | Discharge status: Home Health | Payer: Self-pay | Source: Home / Self Care | Attending: Family Medicine

## 2022-10-24 ENCOUNTER — Other Ambulatory Visit: Payer: Self-pay

## 2022-10-24 ENCOUNTER — Encounter (HOSPITAL_COMMUNITY): Payer: Self-pay | Admitting: Internal Medicine

## 2022-10-24 ENCOUNTER — Inpatient Hospital Stay (HOSPITAL_COMMUNITY): Payer: Medicare PPO | Admitting: Anesthesiology

## 2022-10-24 DIAGNOSIS — S81802A Unspecified open wound, left lower leg, initial encounter: Secondary | ICD-10-CM

## 2022-10-24 DIAGNOSIS — E11622 Type 2 diabetes mellitus with other skin ulcer: Secondary | ICD-10-CM | POA: Diagnosis not present

## 2022-10-24 DIAGNOSIS — E11621 Type 2 diabetes mellitus with foot ulcer: Secondary | ICD-10-CM

## 2022-10-24 DIAGNOSIS — F418 Other specified anxiety disorders: Secondary | ICD-10-CM

## 2022-10-24 DIAGNOSIS — Z794 Long term (current) use of insulin: Secondary | ICD-10-CM

## 2022-10-24 DIAGNOSIS — L97919 Non-pressure chronic ulcer of unspecified part of right lower leg with unspecified severity: Secondary | ICD-10-CM | POA: Diagnosis not present

## 2022-10-24 DIAGNOSIS — L97929 Non-pressure chronic ulcer of unspecified part of left lower leg with unspecified severity: Secondary | ICD-10-CM

## 2022-10-24 DIAGNOSIS — L03119 Cellulitis of unspecified part of limb: Secondary | ICD-10-CM

## 2022-10-24 DIAGNOSIS — I872 Venous insufficiency (chronic) (peripheral): Secondary | ICD-10-CM

## 2022-10-24 DIAGNOSIS — I87333 Chronic venous hypertension (idiopathic) with ulcer and inflammation of bilateral lower extremity: Secondary | ICD-10-CM | POA: Diagnosis not present

## 2022-10-24 HISTORY — PX: I & D EXTREMITY: SHX5045

## 2022-10-24 LAB — GLUCOSE, CAPILLARY
Glucose-Capillary: 177 mg/dL — ABNORMAL HIGH (ref 70–99)
Glucose-Capillary: 153 mg/dL — ABNORMAL HIGH (ref 70–99)
Glucose-Capillary: 159 mg/dL — ABNORMAL HIGH (ref 70–99)
Glucose-Capillary: 158 mg/dL — ABNORMAL HIGH (ref 70–99)
Glucose-Capillary: 155 mg/dL — ABNORMAL HIGH (ref 70–99)

## 2022-10-24 LAB — SURGICAL PCR SCREEN
MRSA, PCR: NEGATIVE
Staphylococcus aureus: POSITIVE — AB

## 2022-10-24 LAB — NO BLOOD PRODUCTS

## 2022-10-24 LAB — AEROBIC/ANAEROBIC CULTURE W GRAM STAIN (SURGICAL/DEEP WOUND)

## 2022-10-24 SURGERY — IRRIGATION AND DEBRIDEMENT EXTREMITY
Anesthesia: General | Site: Leg Lower | Laterality: Bilateral

## 2022-10-24 MED ORDER — 0.9 % SODIUM CHLORIDE (POUR BTL) OPTIME
TOPICAL | Status: DC | PRN
  Administered 2022-10-24: 1000 mL

## 2022-10-24 MED ORDER — ACETAMINOPHEN 325 MG PO TABS
325.0000 mg | ORAL_TABLET | Freq: Four times a day (QID) | ORAL | Status: DC | PRN
  Administered 2022-10-28: 650 mg via ORAL

## 2022-10-24 MED ORDER — CHLORHEXIDINE GLUCONATE 0.12 % MT SOLN
OROMUCOSAL | Status: AC
  Administered 2022-10-24: 15 mL via OROMUCOSAL
  Filled 2022-10-24: qty 15

## 2022-10-24 MED ORDER — CHLORHEXIDINE GLUCONATE 0.12 % MT SOLN: 15.0000 mL | Freq: Once | OROMUCOSAL | Status: AC

## 2022-10-24 MED ORDER — METHOCARBAMOL 500 MG PO TABS
500.0000 mg | ORAL_TABLET | Freq: Four times a day (QID) | ORAL | Status: DC | PRN
  Administered 2022-10-24 – 2022-10-29 (×5): 500 mg via ORAL
  Filled 2022-10-24 (×5): qty 1

## 2022-10-24 MED ORDER — ONDANSETRON HCL 4 MG/2ML IJ SOLN
INTRAMUSCULAR | Status: DC | PRN
  Administered 2022-10-24: 4 mg via INTRAVENOUS

## 2022-10-24 MED ORDER — MAGNESIUM CITRATE PO SOLN: 1.0000 | Freq: Once | ORAL | Status: DC | PRN

## 2022-10-24 MED ORDER — PROPOFOL 10 MG/ML IV BOLUS
INTRAVENOUS | Status: DC | PRN
  Administered 2022-10-24: 110 mg via INTRAVENOUS

## 2022-10-24 MED ORDER — ONDANSETRON HCL 4 MG/2ML IJ SOLN: 4.0000 mg | Freq: Once | INTRAMUSCULAR | Status: DC | PRN

## 2022-10-24 MED ORDER — METOCLOPRAMIDE HCL 5 MG PO TABS: 5.0000 mg | ORAL_TABLET | Freq: Three times a day (TID) | ORAL | Status: DC | PRN

## 2022-10-24 MED ORDER — CEFAZOLIN IN SODIUM CHLORIDE 3-0.9 GM/100ML-% IV SOLN
2.0000 g | INTRAVENOUS | Status: AC
  Administered 2022-10-24: 3 g via INTRAVENOUS

## 2022-10-24 MED ORDER — ATENOLOL 25 MG PO TABS
25.0000 mg | ORAL_TABLET | Freq: Every day | ORAL | Status: DC
  Administered 2022-10-24 – 2022-10-28 (×5): 25 mg via ORAL
  Filled 2022-10-24 (×5): qty 1

## 2022-10-24 MED ORDER — FENTANYL CITRATE (PF) 250 MCG/5ML IJ SOLN
INTRAMUSCULAR | Status: DC | PRN
  Administered 2022-10-24 (×2): 25 ug via INTRAVENOUS
  Administered 2022-10-24: 50 ug via INTRAVENOUS
  Administered 2022-10-24: 25 ug via INTRAVENOUS

## 2022-10-24 MED ORDER — FENTANYL CITRATE (PF) 250 MCG/5ML IJ SOLN
INTRAMUSCULAR | Status: AC
  Filled 2022-10-24: qty 5

## 2022-10-24 MED ORDER — POLYETHYLENE GLYCOL 3350 17 G PO PACK
17.0000 g | PACK | Freq: Every day | ORAL | Status: DC | PRN
  Administered 2022-10-25: 17 g via ORAL
  Filled 2022-10-24: qty 1

## 2022-10-24 MED ORDER — ATENOLOL 25 MG PO TABS
50.0000 mg | ORAL_TABLET | Freq: Every morning | ORAL | Status: DC
  Administered 2022-10-24 – 2022-10-29 (×5): 50 mg via ORAL
  Filled 2022-10-24 (×2): qty 2
  Filled 2022-10-24: qty 1
  Filled 2022-10-24 (×3): qty 2

## 2022-10-24 MED ORDER — MIDAZOLAM HCL 2 MG/2ML IJ SOLN
INTRAMUSCULAR | Status: AC
  Filled 2022-10-24: qty 2

## 2022-10-24 MED ORDER — GABAPENTIN 300 MG PO CAPS
300.0000 mg | ORAL_CAPSULE | Freq: Two times a day (BID) | ORAL | Status: DC
  Administered 2022-10-24 – 2022-10-29 (×11): 300 mg via ORAL
  Filled 2022-10-24 (×11): qty 1

## 2022-10-24 MED ORDER — ATENOLOL 25 MG PO TABS: 25.0000 mg | ORAL_TABLET | ORAL | Status: DC

## 2022-10-24 MED ORDER — LACTATED RINGERS IV SOLN: INTRAVENOUS | Status: DC

## 2022-10-24 MED ORDER — DEXMEDETOMIDINE HCL IN NACL 80 MCG/20ML IV SOLN
INTRAVENOUS | Status: DC | PRN
  Administered 2022-10-24: 10 ug via INTRAVENOUS
  Administered 2022-10-24: 4 ug via INTRAVENOUS

## 2022-10-24 MED ORDER — OXYCODONE HCL 5 MG PO TABS
10.0000 mg | ORAL_TABLET | ORAL | Status: DC | PRN
  Administered 2022-10-24 – 2022-10-29 (×7): 15 mg via ORAL
  Filled 2022-10-24 (×7): qty 3

## 2022-10-24 MED ORDER — CEFAZOLIN SODIUM-DEXTROSE 2-4 GM/100ML-% IV SOLN
INTRAVENOUS | Status: AC
  Filled 2022-10-24: qty 100

## 2022-10-24 MED ORDER — ORAL CARE MOUTH RINSE: 15.0000 mL | Freq: Once | OROMUCOSAL | Status: AC

## 2022-10-24 MED ORDER — ACETAMINOPHEN 10 MG/ML IV SOLN: 1000.0000 mg | Freq: Once | INTRAVENOUS | Status: DC | PRN

## 2022-10-24 MED ORDER — POVIDONE-IODINE 10 % EX SWAB
2.0000 | Freq: Once | CUTANEOUS | Status: AC
  Administered 2022-10-24: 2 via TOPICAL

## 2022-10-24 MED ORDER — HYDROMORPHONE HCL 1 MG/ML IJ SOLN
0.5000 mg | INTRAMUSCULAR | Status: DC | PRN
  Administered 2022-10-24 – 2022-10-25 (×2): 1 mg via INTRAVENOUS
  Filled 2022-10-24 (×2): qty 1

## 2022-10-24 MED ORDER — DOCUSATE SODIUM 100 MG PO CAPS
100.0000 mg | ORAL_CAPSULE | Freq: Two times a day (BID) | ORAL | Status: DC
  Administered 2022-10-24 – 2022-10-29 (×10): 100 mg via ORAL
  Filled 2022-10-24 (×10): qty 1

## 2022-10-24 MED ORDER — SODIUM CHLORIDE 0.9 % IV SOLN: INTRAVENOUS | Status: DC

## 2022-10-24 MED ORDER — FENTANYL CITRATE (PF) 100 MCG/2ML IJ SOLN: 25.0000 ug | INTRAMUSCULAR | Status: DC | PRN

## 2022-10-24 MED ORDER — LIDOCAINE 2% (20 MG/ML) 5 ML SYRINGE
INTRAMUSCULAR | Status: DC | PRN
  Administered 2022-10-24: 60 mg via INTRAVENOUS

## 2022-10-24 MED ORDER — CEFAZOLIN SODIUM-DEXTROSE 2-4 GM/100ML-% IV SOLN
2.0000 g | Freq: Four times a day (QID) | INTRAVENOUS | Status: AC
  Administered 2022-10-24 – 2022-10-25 (×3): 2 g via INTRAVENOUS
  Filled 2022-10-24 (×3): qty 100

## 2022-10-24 MED ORDER — METOCLOPRAMIDE HCL 5 MG/ML IJ SOLN: 5.0000 mg | Freq: Three times a day (TID) | INTRAMUSCULAR | Status: DC | PRN

## 2022-10-24 MED ORDER — OXYCODONE HCL 5 MG PO TABS
5.0000 mg | ORAL_TABLET | ORAL | Status: DC | PRN
  Administered 2022-10-28: 10 mg via ORAL
  Filled 2022-10-24: qty 2

## 2022-10-24 MED ORDER — INSULIN GLARGINE-YFGN 100 UNIT/ML ~~LOC~~ SOLN
20.0000 [IU] | Freq: Every day | SUBCUTANEOUS | Status: DC
  Administered 2022-10-25 – 2022-10-29 (×5): 20 [IU] via SUBCUTANEOUS
  Filled 2022-10-24 (×6): qty 0.2

## 2022-10-24 MED ORDER — ONDANSETRON HCL 4 MG/2ML IJ SOLN: 4.0000 mg | Freq: Four times a day (QID) | INTRAMUSCULAR | Status: DC | PRN

## 2022-10-24 MED ORDER — BISACODYL 10 MG RE SUPP: 10.0000 mg | Freq: Every day | RECTAL | Status: DC | PRN

## 2022-10-24 MED ORDER — ONDANSETRON HCL 4 MG PO TABS: 4.0000 mg | ORAL_TABLET | Freq: Four times a day (QID) | ORAL | Status: DC | PRN

## 2022-10-24 MED ORDER — METHOCARBAMOL 1000 MG/10ML IJ SOLN: 500.0000 mg | Freq: Four times a day (QID) | INTRAVENOUS | Status: DC | PRN

## 2022-10-24 MED ORDER — CHLORHEXIDINE GLUCONATE 4 % EX SOLN: 60.0000 mL | Freq: Once | CUTANEOUS | Status: DC

## 2022-10-24 MED ORDER — PHENYLEPHRINE HCL-NACL 20-0.9 MG/250ML-% IV SOLN
INTRAVENOUS | Status: DC | PRN
  Administered 2022-10-24: 50 ug/min via INTRAVENOUS

## 2022-10-24 SURGICAL SUPPLY — 37 items
BAG COUNTER SPONGE SURGICOUNT (BAG) IMPLANT
BAG SPNG CNTER NS LX DISP (BAG)
BLADE SURG 21 STRL SS (BLADE) ×1 IMPLANT
BNDG CMPR 5X6 CHSV STRCH STRL (GAUZE/BANDAGES/DRESSINGS)
BNDG COHESIVE 6X5 TAN ST LF (GAUZE/BANDAGES/DRESSINGS) IMPLANT
BNDG GAUZE DERMACEA FLUFF 4 (GAUZE/BANDAGES/DRESSINGS) ×2 IMPLANT
BNDG GZE DERMACEA 4 6PLY (GAUZE/BANDAGES/DRESSINGS) ×2
COVER SURGICAL LIGHT HANDLE (MISCELLANEOUS) ×2 IMPLANT
DRAPE DERMATAC (DRAPES) IMPLANT
DRAPE U-SHAPE 47X51 STRL (DRAPES) ×1 IMPLANT
DRSG ADAPTIC 3X8 NADH LF (GAUZE/BANDAGES/DRESSINGS) ×1 IMPLANT
DURAPREP 26ML APPLICATOR (WOUND CARE) ×1 IMPLANT
ELECT REM PT RETURN 9FT ADLT (ELECTROSURGICAL)
ELECTRODE REM PT RTRN 9FT ADLT (ELECTROSURGICAL) IMPLANT
GAUZE SPONGE 4X4 12PLY STRL (GAUZE/BANDAGES/DRESSINGS) ×1 IMPLANT
GLOVE BIOGEL PI IND STRL 9 (GLOVE) ×1 IMPLANT
GLOVE SURG ORTHO 9.0 STRL STRW (GLOVE) ×1 IMPLANT
GOWN STRL REUS W/ TWL XL LVL3 (GOWN DISPOSABLE) ×2 IMPLANT
GOWN STRL REUS W/TWL XL LVL3 (GOWN DISPOSABLE) ×2
GRAFT SKIN WND MICRO 38 (Tissue) IMPLANT
GRAFT SKIN WND SURGICLOSE M95 (Tissue) ×2 IMPLANT
HANDPIECE INTERPULSE COAX TIP (DISPOSABLE)
KIT BASIN OR (CUSTOM PROCEDURE TRAY) ×1 IMPLANT
KIT DRSG PREVENA PLUS 7DAY 125 (MISCELLANEOUS) IMPLANT
KIT TURNOVER KIT B (KITS) ×1 IMPLANT
MANIFOLD NEPTUNE II (INSTRUMENTS) ×1 IMPLANT
NS IRRIG 1000ML POUR BTL (IV SOLUTION) ×1 IMPLANT
PACK ORTHO EXTREMITY (CUSTOM PROCEDURE TRAY) ×1 IMPLANT
PAD ARMBOARD 7.5X6 YLW CONV (MISCELLANEOUS) ×2 IMPLANT
SET HNDPC FAN SPRY TIP SCT (DISPOSABLE) IMPLANT
STOCKINETTE IMPERVIOUS 9X36 MD (GAUZE/BANDAGES/DRESSINGS) IMPLANT
SUT ETHILON 2 0 PSLX (SUTURE) ×1 IMPLANT
SWAB COLLECTION DEVICE MRSA (MISCELLANEOUS) ×1 IMPLANT
SWAB CULTURE ESWAB REG 1ML (MISCELLANEOUS) IMPLANT
TOWEL GREEN STERILE (TOWEL DISPOSABLE) ×1 IMPLANT
TUBE CONNECTING 12X1/4 (SUCTIONS) ×1 IMPLANT
YANKAUER SUCT BULB TIP NO VENT (SUCTIONS) ×1 IMPLANT

## 2022-10-24 NOTE — Plan of Care (Signed)

## 2022-10-24 NOTE — Hospital Course (Addendum)
[name] is a 72 y.o. female with a history of hypertension, hyperlipidemia, stroke, diabetes mellitus on insulin, anxiety, depression, bipolar disorder, asthma, chronic back pain, dementia inability to use, chronic bilateral lower extremity wounds morbid obesity.  Patient presents secondary to drainage from leg wounds with concern for infection.  On admission patient was noted to have drainage from chronic leg wounds without obvious evidence of infection.  Patient received empiric antibiotics which were discontinued shortly after admission.  Orthopedic surgery consulted and took patient to the OR for debridement.

## 2022-10-24 NOTE — Op Note (Signed)
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  Skin, soft tissue excised with a 21 blade knife SURGEON:  Nadara Mustard, MD  PHYSICIAN ASSISTANT:None ANESTHESIA:   General  PREOPERATIVE INDICATIONS:  JALANDA SPRIGGS is a  72 y.o. female with a diagnosis of Venous Ulcer who failed conservative measures and elected for surgical management.    The risks benefits and alternatives were discussed with the patient preoperatively including but not limited to the risks of infection, bleeding, nerve injury, cardiopulmonary complications, the need for revision surgery, among others, and the patient was willing to proceed.  OPERATIVE IMPLANTS:   * No implants in log *  @ENCIMAGES @  OPERATIVE FINDINGS: Patient had good petechial bleeding no deep abscess the soft tissue was hemosiderin stained.  Left leg proximal ulcer after debridement was 5 x 5 cm and 0.1 cm deep. Left leg distal ulcer 6 x 6 cm and 0.1 cm deep. Right leg ulcer 7 x 5 cm and 0.1 cm deep  OPERATIVE PROCEDURE: Patient was brought the operating room and underwent a general anesthetic.  After adequate levels anesthesia were obtained patient's bilateral lower extremities were prepped using DuraPrep draped into a sterile field a timeout was called.  A 21 blade knife was used to excise skin and soft tissue and fascia from the left leg first.  The proximal wound was 5 x 5 cm after debridement the distal 1 was 6 x 6 cm.  This was then covered with 95 cm of Kerecis micro graft this was covered with the new large KCI wound VAC dressing.  The right lower extremity was then debrided of skin and soft tissue with a 21 blade knife wound was 7 x 5 cm after debridement.  There is good petechial bleeding.  A large  wound VAC was applied.  Patient was extubated taken the PACU in stable condition.   DISCHARGE PLANNING:  Antibiotic duration: Continue current antibiotics  Weightbearing: Patient may be weightbearing as tolerated both lower extremities  Pain medication: Opioid pathway  Dressing care/ Wound VAC: Continue wound VAC for both lower extremities x 1 week.  Ambulatory devices: Walker  Discharge to: Discharge planning based on therapy recommendations.  Follow-up: In the office 1 week post operative.

## 2022-10-24 NOTE — Anesthesia Procedure Notes (Signed)
Procedure Name: LMA Insertion Date/Time: 10/24/2022 11:02 AM  Performed by: Loleta Caitland Porchia, CRNAPre-anesthesia Checklist: Patient identified, Patient being monitored, Timeout performed, Emergency Drugs available and Suction available Patient Re-evaluated:Patient Re-evaluated prior to induction Oxygen Delivery Method: Circle system utilized Preoxygenation: Pre-oxygenation with 100% oxygen Induction Type: IV induction Ventilation: Mask ventilation without difficulty LMA: LMA inserted LMA Size: 4.0 Tube type: Oral Number of attempts: 1 Placement Confirmation: positive ETCO2 and breath sounds checked- equal and bilateral Tube secured with: Tape Dental Injury: Teeth and Oropharynx as per pre-operative assessment

## 2022-10-24 NOTE — Plan of Care (Signed)
  Problem: Clinical Measurements: Goal: Ability to avoid or minimize complications of infection will improve Outcome: Progressing   Problem: Skin Integrity: Goal: Skin integrity will improve Outcome: Progressing   Problem: Education: Goal: Ability to describe self-care measures that may prevent or decrease complications (Diabetes Survival Skills Education) will improve Outcome: Progressing Goal: Individualized Educational Video(s) Outcome: Progressing   Problem: Coping: Goal: Ability to adjust to condition or change in health will improve Outcome: Progressing   Problem: Fluid Volume: Goal: Ability to maintain a balanced intake and output will improve Outcome: Progressing   Problem: Health Behavior/Discharge Planning: Goal: Ability to identify and utilize available resources and services will improve Outcome: Progressing Goal: Ability to manage health-related needs will improve Outcome: Progressing   

## 2022-10-24 NOTE — Progress Notes (Signed)
PROGRESS NOTE    [name]  ZOX:[id number] DOB: June 11, 1951 DOA: 10/22/2022 PCP: Salvatore Decent, PA-C   Brief Narrative: [name] is a 72 y.o. female with a history of hypertension, hyperlipidemia, stroke, diabetes mellitus on insulin, anxiety, depression, bipolar disorder, asthma, chronic back pain, dementia inability to use, chronic bilateral lower extremity wounds morbid obesity.  Patient presents secondary to drainage from leg wounds with concern for infection.  On admission patient was noted to have drainage from chronic leg wounds without obvious evidence of infection.  Patient received empiric antibiotics which were discontinued shortly after admission.  Orthopedic surgery consulted and took patient to the OR for debridement.   Assessment and Plan:  Chronic bilateral lower extremity wounds No obvious evidence of infection. One ulcer appears to be amenable to debridement. Orthopedic surgery consulted. X-rays negative for osteomyelitis. -MRI left ankle (ordered but never completed) -Orthopedic surgery recommendations: surgery today -Wound nurse recommendations (5/9): Clean bilateral lower legs with soap and water, dry thoroughly. Apply Medihoney to L medial lower leg (draining wound) and dry gauze. Apply Aquaphor to bilateral legs and feet (DO NOT PLACE IN BETWEEN TOES), cover with ABD pad to L medial wound then wrap bilateral legs with Kerlix roll gauze beginning just above toes and ending right below knees. May secure with Ace bandage in same fashion as Kerlix.      Diabetes mellitus type 2 Uncontrolled with hyperglycemia. Hemoglobin A1C of 8.1%. Patient states she takes Lantus 100 units daily divided to 80 and 20 unit doses. Blood sugar uncontrolled while admitted. -Semglee 20 units daily -Continue SSI  Diabetic neuropathy Patient is prescribed gabapentin 600 mg twice daily but ports taking gabapentin 300 mg twice daily as an outpatient. -Resume gabapentin 300 mg  twice daily  Asthma Stable. - Continue albuterol PRN  Hypertension Patient is managed on nifedipine 90 mg daily and atenolol 50 mg every morning and 25 mg nightly as an outpatient. -Resume home atenolol  History of stroke Patient is on aspirin as an outpatient which was held on admission. -Resume aspirin surgical management  Depression Patient is prescribed trazodone as an outpatient but is listed as not taking at this time.   DVT prophylaxis: Lovenox Code Status:   Code Status: DNR Family Communication: Husband and son at bedside Disposition Plan: Discharge home likely in 1-2 days pending orthopedic surgery recommendations/management   Consultants:  Orthopedic surgery  Procedures:  None  Antimicrobials: Vancomycin    Subjective: Patient is concerned about post-op pain. Otherwise no other concerns.  Objective: BP 129/64 (BP Location: Left Arm)   Pulse 81   Temp 98.5 F (36.9 C) (Oral)   Resp 17   SpO2 95%   Examination:  General exam: Appears calm and comfortable Respiratory system: Clear to auscultation. Respiratory effort normal. Cardiovascular system: S1 & S2 heard, RRR. Gastrointestinal system: Abdomen is nondistended, soft and nontender. Normal bowel sounds heard. Central nervous system: Alert and oriented. No focal neurological deficits. Musculoskeletal: No calf tenderness Psychiatry: Judgement and insight appear normal. Mood & affect appropriate.    Data Reviewed: I have personally reviewed following labs and imaging studies  CBC Lab Results  Component Value Date   WBC 9.2 10/22/2022   RBC 4.63 10/22/2022   HGB 12.5 10/22/2022   HCT 39.2 10/22/2022   MCV 84.7 10/22/2022   MCH 27.0 10/22/2022   PLT 406 (H) 10/22/2022   MCHC 31.9 10/22/2022   RDW 15.9 (H) 10/22/2022   LYMPHSABS 2.1 10/22/2022   MONOABS 0.6 10/22/2022  EOSABS 0.3 10/22/2022   BASOSABS 0.0 10/22/2022     Last metabolic panel Lab Results  Component Value Date   NA 136  10/22/2022   K 4.3 10/22/2022   CL 102 10/22/2022   CO2 23 10/22/2022   BUN 8 10/22/2022   CREATININE 0.87 10/22/2022   GLUCOSE 174 (H) 10/22/2022   GFRNONAA >60 10/22/2022   GFRAA >60 11/18/2019   CALCIUM 9.6 10/22/2022   PHOS 3.1 06/27/2022   PROT 8.5 (H) 10/22/2022   ALBUMIN 2.8 (L) 10/22/2022   BILITOT 0.5 10/22/2022   ALKPHOS 90 10/22/2022   AST 19 10/22/2022   ALT 11 10/22/2022   ANIONGAP 11 10/22/2022    GFR: CrCl cannot be calculated (Unknown ideal weight.).  Recent Results (from the past 240 hour(s))  Surgical pcr screen     Status: Abnormal   Collection Time: 10/24/22  6:58 AM   Specimen: Nasal Mucosa; Nasal Swab  Result Value Ref Range Status   MRSA, PCR NEGATIVE NEGATIVE Final   Staphylococcus aureus POSITIVE (A) NEGATIVE Final    Comment: (NOTE) The Xpert SA Assay (FDA approved for NASAL specimens in patients 23 years of age and older), is one component of a comprehensive surveillance program. It is not intended to diagnose infection nor to guide or monitor treatment. Performed at [facility], [address] [place], [place] [address]       Radiology Studies: VAS [place] ABI WITH/WO TBI  Result Date: 10/23/2022  LOWER EXTREMITY DOPPLER STUDY Patient Name:  [name]  Date of Exam:   10/23/2022 Medical Rec #: [id number]        Accession #:    [id number] Date of Birth: Apr 07, 1951       Patient Gender: F Patient Age:   39 years Exam Location:  [facility] Procedure:      VAS [place] ABI WITH/WO TBI Referring Phys: Ulyess Blossom RATHORE --------------------------------------------------------------------------------  Indications: Chronic venous stasis ulcers x many years. Difficult to palpate              pulses on clinical exam due to skin changes. High Risk Factors: Hyperlipidemia, Diabetes, prior CVA.  Limitations: Technically difficult exam secondary to recently applied bandaging              to bilateral ankles/feet, patient body habitus, chronic skin               changes, and venous interference. Comparison Study: Numerous prior normal ABI studies. Most recent ABI performed                   05-27-2021 at outside facility. Most recent ABI performed in                   network 12-15-2018 was within normal limits with triphasic                   pulses bilaterally and normal ABI and TBI values. Performing Technologist: Jean Rosenthal RDMS RVT  Examination Guidelines: A complete evaluation includes at minimum, Doppler waveform signals and systolic blood pressure reading at the level of bilateral brachial, anterior tibial, and posterior tibial arteries, when vessel segments are accessible. Bilateral testing is considered an integral part of a complete examination. Photoelectric Plethysmograph (PPG) waveforms and toe systolic pressure readings are included as required and additional duplex testing as needed. Limited examinations for reoccurring indications may be performed as noted.  ABI Findings: +---------+------------------+-----+---------+---------------------------------+ Right    Rt Pressure (mmHg)IndexWaveform Comment                           +---------+------------------+-----+---------+---------------------------------+  Brachial 124                    triphasic                                  +---------+------------------+-----+---------+---------------------------------+ PTA                                      Unable to insonate due to                                                  recently applied bandaging.       +---------+------------------+-----+---------+---------------------------------+ DP       183               1.38 triphasic                                  +---------+------------------+-----+---------+---------------------------------+ Great Toe110               0.83 Normal                                     +---------+------------------+-----+---------+---------------------------------+  +---------+------------------+-----+-----------+-------------------------------+ Left     Lt Pressure (mmHg)IndexWaveform   Comment                         +---------+------------------+-----+-----------+-------------------------------+ Brachial 133                    triphasic                                  +---------+------------------+-----+-----------+-------------------------------+ PTA                                        Unable to insonate due to                                                  recently applied bandaging.     +---------+------------------+-----+-----------+-------------------------------+ DP       167               1.26 triphasic                                  +---------+------------------+-----+-----------+-------------------------------+ Great Toe89                0.67 Near normalCircumference of great toe                                                 limits accuracy of pressures.   +---------+------------------+-----+-----------+-------------------------------+  Summary: Right: Resting right ankle-brachial index indicates noncompressible right lower extremity arteries. The right toe-brachial index is normal. Left: Resting left ankle-brachial index is within normal range. The left toe-brachial index is near normal. *See table(s) above for measurements and observations.  Electronically signed by Coral Else MD on 10/23/2022 at 9:48:57 PM.    Final    DG Foot Complete Right  Result Date: 10/22/2022 CLINICAL DATA:  Diabetic foot wound EXAM: RIGHT FOOT COMPLETE - 3+ VIEW COMPARISON:  10/15/2021 FINDINGS: Osseous demineralization. No fracture or malalignment. No definitive osseous destructive change. Small plantar calcaneal spur. Generalized soft tissue swelling. Possible ulcer dorsal aspect of the foot. Possible ulcer posterior heel. No soft tissue gas. IMPRESSION: 1. Osseous demineralization. No acute osseous abnormality. 2. Generalized soft  tissue swelling. Possible ulcer dorsal aspect of the foot and posterior heel. Electronically Signed   By: Jasmine Pang M.D.   On: 10/22/2022 22:09   DG Foot Complete Left  Result Date: 10/22/2022 CLINICAL DATA:  Diabetic wound. Pain. EXAM: LEFT FOOT - COMPLETE 3+ VIEW COMPARISON:  Radiograph 10/15/2021 FINDINGS: Bony under mineralization. Hammertoe deformity of the toes. There is hallux valgus with degenerative change of the first metatarsal phalangeal joint. Erosion is noted about the medial aspect of the first metatarsal head, chronic. Plantar calcaneal spur and Achilles tendon enthesophyte. Dorsal soft tissue thickening. There is skin and soft tissue irregularity about the dorsum of the mid and forefoot. Skin irregularity posterior to the calcaneus. No evidence of soft tissue gas or radiopaque foreign body. Multiple soft tissue calcifications involving the distal lower leg. IMPRESSION: 1. Soft tissue irregularity about the dorsum of the mid and forefoot and skin irregularity posterior to the calcaneus, suspicious for soft tissue ulcer. No radiographic evidence of osteomyelitis. 2. Hallux valgus with degenerative change of the first metatarsophalangeal joint. Chronic erosion about the first metatarsal head. Electronically Signed   By: Narda Rutherford M.D.   On: 10/22/2022 22:05      LOS: 1 day    Jacquelin Hawking, MD [facility] 10/24/2022, 9:48 AM   If 7PM-7AM, please contact night-coverage [email or web address]

## 2022-10-24 NOTE — Progress Notes (Signed)
PT Cancellation Note  Patient Details Name: [name] MRN: [id number] DOB: [birth year]   Cancelled Treatment:    Reason Eval/Treat Not Completed: Medical issues which prohibited therapy.  Post op wound debridement, reattempt tomorrow.   Ivar Drape 10/24/2022, 3:01 PM  Samul Dada, PT PhD Acute Rehab Dept. Number: [facility] [phone] and [facility] [phone]

## 2022-10-24 NOTE — Transfer of Care (Signed)
Immediate Anesthesia Transfer of Care Note  Patient: [name]  Procedure(s) Performed: DEBRIDEMENT LEFT LEG AND RIGHT ANKLE (Bilateral: Leg Lower)  Patient Location: PACU  Anesthesia Type:General  Level of Consciousness: sedated  Airway & Oxygen Therapy: Patient Spontanous Breathing and Patient connected to face mask oxygen  Post-op Assessment: Report given to RN and Post -op Vital signs reviewed and stable  Post vital signs: Reviewed and stable  Last Vitals:  Vitals Value Taken Time  BP 107/68 10/24/22 1149  Temp    Pulse 84 10/24/22 1151  Resp 25 10/24/22 1151  SpO2 100 % 10/24/22 1151  Vitals shown include unvalidated device data.  Last Pain:  Vitals:   10/24/22 1010  TempSrc: Oral  PainSc:          Complications: No notable events documented.

## 2022-10-24 NOTE — Consult Note (Signed)
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  HPI: [name] is a 72 y.o. female who presents with chronic venous and lymphatic ulcers both legs.  Patient has been undergoing home health nursing with dressing changes twice a week without resolution of the ulcers.  Past Medical History:  Diagnosis Date   Anxiety    Arthritis    "back, arms, legs" (03/24/2016)   Asthma    Chronic lower back pain    Colonic polyp    last colonoscopy done in 2009 with normal results per medical record   Depressive disorder    Gastric polyp    Gout    has taken allopurinol 300mg  once daily in past   Headache    History of hiatal hernia    Hypertension    Migraine    "none in awhile; might have a couple/year" (03/24/2016)   Mixed hyperlipidemia    01/2016 Total chol 141, HDL 59, LDL 63, ration 1.1   Osteoarthritis    TIA (transient ischemic attack) 11/2014   Type II diabetes mellitus (HCC)    Past Surgical History:  Procedure Laterality Date   ARTERY BIOPSY Right 08/12/2016   Procedure: BIOPSY TEMPORAL ARTERY;  Surgeon: Sherren Kerns, MD;  Location: [facility] OR;  Service: Vascular;  Laterality: Right;  BIOPSY TEMPORAL ARTERY   BREAST BIOPSY Left ~ 2015   benign   CARPAL TUNNEL RELEASE Bilateral    DILATION AND CURETTAGE OF UTERUS     IR CT HEAD LTD  06/11/2022   IR PERCUTANEOUS ART THROMBECTOMY/INFUSION INTRACRANIAL INC DIAG ANGIO  06/11/2022   IR US GUIDE VASC ACCESS RIGHT  06/11/2022   KNEE ARTHROSCOPY Right 2003   in [facility]   LAPAROSCOPIC CHOLECYSTECTOMY     LOOP RECORDER INSERTION N/A 06/26/2022   Procedure: LOOP RECORDER INSERTION;  Surgeon: Regan Lemming, MD;  Location: [facility] INVASIVE CV LAB;  Service: Cardiovascular;  Laterality: N/A;   RADIOLOGY WITH ANESTHESIA N/A 06/11/2022   Procedure: IR WITH ANESTHESIA;  Surgeon: Radiologist, Medication, MD;  Location: [facility] OR;  Service:  Radiology;  Laterality: N/A;   TUBAL LIGATION     VAGINAL HYSTERECTOMY  1982   Social History   Socioeconomic History   Marital status: Married    Spouse name: Not on file   Number of children: Not on file   Years of education: 12+   Highest education level: Not on file  Occupational History   Not on file  Tobacco Use   Smoking status: Never   Smokeless tobacco: Never  Vaping Use   Vaping Use: Never used  Substance and Sexual Activity   Alcohol use: No   Drug use: No   Sexual activity: Never  Other Topics Concern   Not on file  Social History Narrative   Right handed   Caffeine use: none   Lives with husband   Recently moved from [place]   Social Determinants of Health   Financial Resource Strain: Not on file  Food Insecurity: No Food Insecurity (10/23/2022)   Hunger Vital Sign    Worried About Running Out of Food in the Last Year: Never true    Ran Out of Food in the Last Year: Never true  Transportation Needs: No Transportation Needs (10/23/2022)   PRAPARE - Administrator, Civil Service (Medical): No    Lack of Transportation (Non-Medical): No  Physical Activity: Not on file  Stress:  Not on file  Social Connections: Not on file   Family History  Problem Relation Age of Onset   Stroke Father    Stroke Brother    Cancer Other    Gout Mother    Hypertension Mother    Arthritis Mother    Asthma Mother    Stroke Brother    - negative except otherwise stated in the family history section Allergies  Allergen Reactions   Ace Inhibitors Swelling   Latex Itching and Swelling   Tizanidine Other (See Comments)    Tremors    Ultram [Tramadol] Nausea And Vomiting   Diprivan [Propofol] Itching   Metformin And Related Other (See Comments)    Tremors  Chills   Prior to Admission medications   Medication Sig Start Date End Date Taking? Authorizing Provider  acetaminophen (TYLENOL) 325 MG tablet Take 2 tablets (650 mg total) by mouth every 6 (six)  hours as needed for mild pain (or Fever >/= 101). 06/28/22  Yes Elgergawy, Leana Roe, MD  aspirin EC 81 MG tablet Take 81 mg by mouth daily. Swallow whole.   Yes [provider]  atenolol (TENORMIN) 25 MG tablet Take 25-50 mg by mouth See admin instructions. 50 mg every morning, 25 mg at bedtime   Yes [provider]  cyclobenzaprine (FLEXERIL) 5 MG tablet Take 5 mg by mouth 3 (three) times daily as needed for muscle spasms. 06/03/22  Yes [provider]  donepezil (ARICEPT) 10 MG tablet Take 10 mg by mouth in the morning.   Yes [provider]  gabapentin (NEURONTIN) 300 MG capsule Take 2 capsules (600 mg total) by mouth 2 (two) times daily. Patient taking differently: Take 300 mg by mouth 2 (two) times daily. 02/08/21  Yes Sabino Dick, DO  insulin glargine (LANTUS) 100 UNIT/ML Solostar Pen Inject 30 Units into the skin every morning. And pen needles 1/day Patient taking differently: Inject 70 Units into the skin every morning. And pen needles 1/day 06/28/22  Yes Elgergawy, Leana Roe, MD  lurasidone (LATUDA) 40 MG TABS tablet Take 1 tablet (40 mg total) by mouth daily with breakfast. 06/16/22  Yes Bobbye Morton, MD  NIFEdipine (PROCARDIA XL/NIFEDICAL-XL) 90 MG 24 hr tablet Take 90 mg by mouth in the morning.   Yes [provider]  ondansetron (ZOFRAN) 4 MG tablet Take 1 tablet (4 mg total) by mouth every 6 (six) hours. 07/16/22  Yes Prosperi, Christian H, PA-C  traZODone (DESYREL) 50 MG tablet Take 1 tablet (50 mg total) by mouth at bedtime and may repeat dose one time if needed. Patient not taking: Reported on 10/23/2022 06/02/22   Bobbye Morton, MD   VAS [place] ABI WITH/WO TBI  Result Date: 10/23/2022  LOWER EXTREMITY DOPPLER STUDY Patient Name:  [name]  Date of Exam:   10/23/2022 Medical Rec #: [id number]        Accession #:    [id number] Date of Birth: Dec 04, 1950       Patient Gender: F Patient Age:   1 years Exam Location:  [facility]  Procedure:      VAS [place] ABI WITH/WO TBI Referring Phys: [name] --------------------------------------------------------------------------------  Indications: Chronic venous stasis ulcers x many years. Difficult to palpate              pulses on clinical exam due to skin changes. High Risk Factors: Hyperlipidemia, Diabetes, prior CVA.  Limitations: Technically difficult exam secondary to recently applied bandaging  to bilateral ankles/feet, patient body habitus, chronic skin              changes, and venous interference. Comparison Study: Numerous prior normal ABI studies. Most recent ABI performed                   05-27-2021 at outside facility. Most recent ABI performed in                   network 12-15-2018 was within normal limits with triphasic                   pulses bilaterally and normal ABI and TBI values. Performing Technologist: Jean Rosenthal RDMS RVT  Examination Guidelines: A complete evaluation includes at minimum, Doppler waveform signals and systolic blood pressure reading at the level of bilateral brachial, anterior tibial, and posterior tibial arteries, when vessel segments are accessible. Bilateral testing is considered an integral part of a complete examination. Photoelectric Plethysmograph (PPG) waveforms and toe systolic pressure readings are included as required and additional duplex testing as needed. Limited examinations for reoccurring indications may be performed as noted.  ABI Findings: +---------+------------------+-----+---------+---------------------------------+ Right    Rt Pressure (mmHg)IndexWaveform Comment                           +---------+------------------+-----+---------+---------------------------------+ Brachial 124                    triphasic                                  +---------+------------------+-----+---------+---------------------------------+ PTA                                      Unable to insonate due to                                                   recently applied bandaging.       +---------+------------------+-----+---------+---------------------------------+ DP       183               1.38 triphasic                                  +---------+------------------+-----+---------+---------------------------------+ Great Toe110               0.83 Normal                                     +---------+------------------+-----+---------+---------------------------------+ +---------+------------------+-----+-----------+-------------------------------+ Left     Lt Pressure (mmHg)IndexWaveform   Comment                         +---------+------------------+-----+-----------+-------------------------------+ Brachial 133                    triphasic                                  +---------+------------------+-----+-----------+-------------------------------+ PTA  Unable to insonate due to                                                  recently applied bandaging.     +---------+------------------+-----+-----------+-------------------------------+ DP       167               1.26 triphasic                                  +---------+------------------+-----+-----------+-------------------------------+ Great Toe89                0.67 Near normalCircumference of great toe                                                 limits accuracy of pressures.   +---------+------------------+-----+-----------+-------------------------------+  Summary: Right: Resting right ankle-brachial index indicates noncompressible right lower extremity arteries. The right toe-brachial index is normal. Left: Resting left ankle-brachial index is within normal range. The left toe-brachial index is near normal. *See table(s) above for measurements and observations.  Electronically signed by Coral Else MD on 10/23/2022 at 9:48:57 PM.    Final    DG Foot  Complete Right  Result Date: 10/22/2022 CLINICAL DATA:  Diabetic foot wound EXAM: RIGHT FOOT COMPLETE - 3+ VIEW COMPARISON:  10/15/2021 FINDINGS: Osseous demineralization. No fracture or malalignment. No definitive osseous destructive change. Small plantar calcaneal spur. Generalized soft tissue swelling. Possible ulcer dorsal aspect of the foot. Possible ulcer posterior heel. No soft tissue gas. IMPRESSION: 1. Osseous demineralization. No acute osseous abnormality. 2. Generalized soft tissue swelling. Possible ulcer dorsal aspect of the foot and posterior heel. Electronically Signed   By: Jasmine Pang M.D.   On: 10/22/2022 22:09   DG Foot Complete Left  Result Date: 10/22/2022 CLINICAL DATA:  Diabetic wound. Pain. EXAM: LEFT FOOT - COMPLETE 3+ VIEW COMPARISON:  Radiograph 10/15/2021 FINDINGS: Bony under mineralization. Hammertoe deformity of the toes. There is hallux valgus with degenerative change of the first metatarsal phalangeal joint. Erosion is noted about the medial aspect of the first metatarsal head, chronic. Plantar calcaneal spur and Achilles tendon enthesophyte. Dorsal soft tissue thickening. There is skin and soft tissue irregularity about the dorsum of the mid and forefoot. Skin irregularity posterior to the calcaneus. No evidence of soft tissue gas or radiopaque foreign body. Multiple soft tissue calcifications involving the distal lower leg. IMPRESSION: 1. Soft tissue irregularity about the dorsum of the mid and forefoot and skin irregularity posterior to the calcaneus, suspicious for soft tissue ulcer. No radiographic evidence of osteomyelitis. 2. Hallux valgus with degenerative change of the first metatarsophalangeal joint. Chronic erosion about the first metatarsal head. Electronically Signed   By: [name] Rutherford M.D.   On: 10/22/2022 22:05   - pertinent xrays, CT, MRI studies were reviewed and independently interpreted  Positive ROS: All other systems have been reviewed and were  otherwise negative with the exception of those mentioned in the HPI and as above.  Physical Exam: General: Alert, no acute distress Psychiatric: Patient is competent for consent with normal mood and affect Lymphatic: No axillary or cervical lymphadenopathy Cardiovascular: No pedal edema Respiratory:  No cyanosis, no use of accessory musculature GI: No organomegaly, abdomen is soft and non-tender    Images:  @ENCIMAGES @  Labs:  Lab Results  Component Value Date   HGBA1C 8.1 (H) 10/23/2022   HGBA1C 9.5 (H) 06/11/2022   HGBA1C 11.5 (A) 05/21/2021   ESRSEDRATE 90 (H) 10/23/2022   ESRSEDRATE 117 (H) 03/06/2021   ESRSEDRATE 104 (H) 02/05/2021   CRP 3.8 (H) 10/23/2022   CRP 43.7 (H) 03/06/2021   CRP 0.8 06/29/2019   REPTSTATUS 06/26/2022 FINAL 06/25/2022   GRAMSTAIN  02/06/2021    RARE SQUAMOUS EPITHELIAL CELLS PRESENT FEW WBC SEEN MODERATE GRAM POSITIVE COCCI FEW GRAM NEGATIVE RODS FEW GRAM VARIABLE ROD    GRAMSTAIN  02/06/2021    NO SQUAMOUS EPITHELIAL CELLS SEEN FEW WBC SEEN MODERATE GRAM POSITIVE COCCI MODERATE GRAM NEGATIVE RODS FEW GRAM POSITIVE RODS    CULT MULTIPLE SPECIES PRESENT, SUGGEST RECOLLECTION (A) 06/25/2022   LABORGA KLEBSIELLA PNEUMONIAE 02/06/2021    Lab Results  Component Value Date   ALBUMIN 2.8 (L) 10/22/2022   ALBUMIN 2.9 (L) 07/16/2022   ALBUMIN 2.4 (L) 06/27/2022   PREALBUMIN 12.5 (L) 03/16/2017        Latest Ref Rng & Units 10/22/2022    5:54 PM 07/16/2022    4:19 PM 06/27/2022    1:15 AM  CBC EXTENDED  WBC 4.0 - 10.5 K/uL 9.2  11.1  5.9   RBC 3.87 - 5.11 MIL/uL 4.63  4.51  4.29   Hemoglobin 12.0 - 15.0 g/dL 40.9  81.1  91.4   HCT 36.0 - 46.0 % 39.2  38.6  37.2   Platelets 150 - 400 K/uL 406  375  405   NEUT# 1.7 - 7.7 K/uL 6.0     Lymph# 0.7 - 4.0 K/uL 2.1       Neurologic: Patient does not have protective sensation bilateral lower extremities.   MUSCULOSKELETAL:   Skin: Examination patient has brawny skin color changes with  venous and lymphatic insufficiency both lower extremities.  Patient has a deep ulcer over the tibial crest on the left as well as an ulcer over the left foot.  On the right ankle there is also a ulcer as well.  Patient has a palpable dorsalis pedis pulse bilaterally.  Ankle-brachial indices shows adequate circulation.  Assessment: Assessment: Venous and lymphatic insufficiency ulcers bilateral lower extremities that is failed chronic conservative wound care with good arterial inflow.  Plan: Pain: Will plan for surgical excision of the necrotic ulcers.  The wounds will be treated with [employer]rec[employer] tissue graft followed by a new wound VAC from [employer].  Risk and benefits were discussed including need for additional surgery.  Patient states she understands wished to proceed at this time.  Thank you for the consult and the opportunity to see Ms. Synetta Shadow, MD [facility] [phone] 10:49 AM

## 2022-10-24 NOTE — Anesthesia Postprocedure Evaluation (Signed)
Anesthesia Post Note  Patient: [name]  Procedure(s) Performed: DEBRIDEMENT LEFT LEG AND RIGHT ANKLE (Bilateral: Leg Lower)     Patient location during evaluation: PACU Anesthesia Type: General Level of consciousness: awake and alert Pain management: pain level controlled Vital Signs Assessment: post-procedure vital signs reviewed and stable Respiratory status: spontaneous breathing, nonlabored ventilation, respiratory function stable and patient connected to nasal cannula oxygen Cardiovascular status: blood pressure returned to baseline and stable Postop Assessment: no apparent nausea or vomiting Anesthetic complications: no  No notable events documented.  Last Vitals:  Vitals:   10/24/22 1215 10/24/22 1245  BP: 136/74 131/68  Pulse: (!) 101 87  Resp: 14 16  Temp: 36.6 C (!) 36.4 C  SpO2: 96% 97%    Last Pain:  Vitals:   10/24/22 1245  TempSrc: Oral  PainSc:                  Trevor Iha

## 2022-10-24 NOTE — Progress Notes (Signed)
Pt refused to sign consent form for surgery scheduled today, Pt's family at bedside would like to speak to the surgeon about the procedure before moving forward. MD Lajoyce Corners has been notified.

## 2022-10-25 ENCOUNTER — Inpatient Hospital Stay (HOSPITAL_COMMUNITY): Payer: Medicare PPO

## 2022-10-25 ENCOUNTER — Encounter (HOSPITAL_COMMUNITY): Payer: Self-pay | Admitting: Orthopedic Surgery

## 2022-10-25 DIAGNOSIS — H539 Unspecified visual disturbance: Secondary | ICD-10-CM | POA: Diagnosis not present

## 2022-10-25 DIAGNOSIS — E11622 Type 2 diabetes mellitus with other skin ulcer: Secondary | ICD-10-CM | POA: Diagnosis not present

## 2022-10-25 DIAGNOSIS — S81809A Unspecified open wound, unspecified lower leg, initial encounter: Secondary | ICD-10-CM | POA: Diagnosis not present

## 2022-10-25 DIAGNOSIS — L03119 Cellulitis of unspecified part of limb: Secondary | ICD-10-CM | POA: Diagnosis not present

## 2022-10-25 DIAGNOSIS — L97929 Non-pressure chronic ulcer of unspecified part of left lower leg with unspecified severity: Secondary | ICD-10-CM | POA: Diagnosis not present

## 2022-10-25 LAB — LIPID PANEL
Cholesterol: 136 mg/dL (ref 0–200)
HDL: 46 mg/dL (ref >40)
LDL Cholesterol: 69 mg/dL (ref 0–99)
Total CHOL/HDL Ratio: 3 RATIO
Triglycerides: 104 mg/dL (ref <150)
VLDL: 21 mg/dL (ref 0–40)

## 2022-10-25 LAB — CBC
HCT: 39.8 % (ref 36.0–46.0)
Hemoglobin: 12.1 g/dL (ref 12.0–15.0)
MCH: 26 pg (ref 26.0–34.0)
MCHC: 30.4 g/dL (ref 30.0–36.0)
MCV: 85.4 fL (ref 80.0–100.0)
Platelets: 413 10*3/uL — ABNORMAL HIGH (ref 150–400)
RBC: 4.66 MIL/uL (ref 3.87–5.11)
RDW: 15.8 % — ABNORMAL HIGH (ref 11.5–15.5)
WBC: 9.9 10*3/uL (ref 4.0–10.5)
nRBC: 0 % (ref 0.0–0.2)

## 2022-10-25 LAB — BASIC METABOLIC PANEL
Anion gap: 9 (ref 5–15)
BUN: 9 mg/dL (ref 8–23)
CO2: 25 mmol/L (ref 22–32)
Calcium: 9.3 mg/dL (ref 8.9–10.3)
Chloride: 99 mmol/L (ref 98–111)
Creatinine, Ser: 0.92 mg/dL (ref 0.44–1.00)
GFR, Estimated: >60 mL/min (ref >60)
Glucose, Bld: 182 mg/dL — ABNORMAL HIGH (ref 70–99)
Potassium: 4 mmol/L (ref 3.5–5.1)
Sodium: 133 mmol/L — ABNORMAL LOW (ref 135–145)

## 2022-10-25 LAB — GLUCOSE, CAPILLARY
Glucose-Capillary: 163 mg/dL — ABNORMAL HIGH (ref 70–99)
Glucose-Capillary: 174 mg/dL — ABNORMAL HIGH (ref 70–99)
Glucose-Capillary: 175 mg/dL — ABNORMAL HIGH (ref 70–99)
Glucose-Capillary: 97 mg/dL (ref 70–99)
Glucose-Capillary: 169 mg/dL — ABNORMAL HIGH (ref 70–99)

## 2022-10-25 LAB — AEROBIC/ANAEROBIC CULTURE W GRAM STAIN (SURGICAL/DEEP WOUND)

## 2022-10-25 MED ORDER — IOHEXOL 350 MG/ML SOLN
75.0000 mL | Freq: Once | INTRAVENOUS | Status: AC | PRN
  Administered 2022-10-25: 75 mL via INTRAVENOUS

## 2022-10-25 MED ORDER — ASPIRIN 81 MG PO CHEW
81.0000 mg | CHEWABLE_TABLET | Freq: Every day | ORAL | Status: DC
  Administered 2022-10-25 – 2022-10-29 (×5): 81 mg via ORAL
  Filled 2022-10-25 (×5): qty 1

## 2022-10-25 MED ORDER — SODIUM CHLORIDE 0.9 % IV SOLN
3.0000 g | Freq: Four times a day (QID) | INTRAVENOUS | Status: DC
  Administered 2022-10-25 – 2022-10-27 (×7): 3 g via INTRAVENOUS
  Filled 2022-10-25 (×7): qty 8

## 2022-10-25 MED ORDER — GADOBUTROL 1 MMOL/ML IV SOLN
10.0000 mL | Freq: Once | INTRAVENOUS | Status: AC | PRN
  Administered 2022-10-25: 10 mL via INTRAVENOUS

## 2022-10-25 NOTE — Progress Notes (Signed)
Patient ID: [name], female   DOB: Apr 19, 1951, 72 y.o.   MRN: [id number] Patient is postoperative day 1 debridement and tissue grafting to venous and lymphatic ulcers both lower extremities.  There is about 25 cc in the wound VAC canister bilaterally.  Patient will continue the wound VAC dressings for about a week.  Patient may be weightbearing as tolerated on both lower extremities.  Patient has no complaints this morning.

## 2022-10-25 NOTE — Progress Notes (Signed)
Around 08:38 patient reported loss of vision with  total blackness lasting no more than 30 seconds.  and continues to have blurry vision. Code stroke called. RN obtained vital signs and performed initial NIHSS assessment. See flowsheets for [facility] details. Dr. Rollene Fare at the bedside.

## 2022-10-25 NOTE — Evaluation (Signed)
Physical Therapy Evaluation Patient Details Name: [name] MRN: [id number] DOB: 1951/01/01 Today's Date: 10/25/2022  History of Present Illness  Pt is 72 yo female who presents on 10/23/22.  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 PMH: HTN, OA, gout, HLD, TIA, DM2,  Clinical Impression  Pt admitted with above diagnosis. PT eval limited today due to pt reports of vision darkening for a few seconds and then persistent blurry vision. In addition, she was initially very responsive and quick witted with recall of all orientation questions then became slower of speech and processing for a short period of time. RN obtained and present and called rapid response. Pt seemingly with some RUE coordination issues as well but difficult to discern is this was from the blurry vision. Pt with light headedness when sitting edge of bed. Then session discontinued and pt returned to supine. Will follow for further recommendations going forward based on upcoming test results for neuro event.  Pt currently with functional limitations due to the deficits listed below (see PT Problem List). Pt will benefit from acute skilled PT to increase their independence and safety with mobility to allow discharge.          Recommendations for follow up therapy are one component of a multi-disciplinary discharge planning process, led by the attending physician.  Recommendations may be updated based on patient status, additional functional criteria and insurance authorization.  Follow Up Recommendations Can patient physically be transported by private vehicle: Yes     Assistance Recommended at Discharge Frequent or constant Supervision/Assistance  Patient can return home with the following  A lot of help with walking and/or transfers;A lot of help with bathing/dressing/bathroom;Assistance with cooking/housework;Help with stairs or ramp for entrance;Assist for transportation    Equipment Recommendations  None recommended by PT  Recommendations for Other Services       Functional Status Assessment Patient has had a recent decline in their functional status and demonstrates the ability to make significant improvements in function in a reasonable and predictable amount of time.     Precautions / Restrictions Precautions Precautions: Fall Restrictions Weight Bearing Restrictions: Yes RLE Weight Bearing: Weight bearing as tolerated LLE Weight Bearing: Weight bearing as tolerated Other Position/Activity Restrictions: BLE wound vacs      Mobility  Bed Mobility Overal bed mobility: Needs Assistance Bed Mobility: Supine to Sit, Sit to Supine     Supine to sit: Min assist, +2 for physical assistance Sit to supine: Mod assist, +2 for physical assistance   General bed mobility comments: pt assisted into sitting EOB, BP increased from 145/68 in supine to 166/87. Pt continued to have blurry vision, relayed some light headedness. Returned to supine for rapid assessment. BP in supine 128/66    Transfers                   General transfer comment: deferred due to neuro symptoms    Ambulation/Gait               General Gait Details: deferred due to neuro symptoms  Stairs            Wheelchair Mobility    Modified Rankin (Stroke Patients Only) Modified Rankin (Stroke Patients Only) Pre-Morbid Rankin Score: Slight disability Modified Rankin: Moderately severe disability     Balance Overall balance assessment: Needs assistance Sitting-balance support: Single extremity supported, Feet supported Sitting balance-Leahy Scale: Fair  Pertinent Vitals/Pain Pain Assessment Pain Assessment: Faces Faces Pain Scale: Hurts a little bit Pain Location: BLE Pain Descriptors / Indicators: Sore, Tingling Pain Intervention(s): Limited activity within patient's tolerance, Monitored during session    Home Living  Family/patient expects to be discharged to:: Private residence Living Arrangements: Spouse/significant other Available Help at Discharge: Available 24 hours/day Type of Home: Apartment Home Access: Ramped entrance       Home Layout: One level Home Equipment: Wheelchair - power;Wheelchair - manual;Transport chair;Hospital bed;Grab bars - toilet;Grab bars - tub/shower;Hand held Armed forces logistics/support/administrative officer (2 wheels) Additional Comments: transfers to power chair and back    Prior Function Prior Level of Function : Needs assist               ADLs Comments: sponge baths     Hand Dominance   Dominant Hand: Right    Extremity/Trunk Assessment   Upper Extremity Assessment Upper Extremity Assessment: Defer to OT evaluation (h/o mild LUE weakness from past CVA)    Lower Extremity Assessment Lower Extremity Assessment: LLE deficits/detail;RLE deficits/detail RLE Deficits / Details: hip flex 3-/5. Had difficulty initiating but was then able to lift. RLE Sensation:  (pt reports tingling) RLE Coordination: decreased gross motor LLE Deficits / Details: weaker on L from prior CVA, hip flex 2+/5 LLE Sensation: WNL LLE Coordination: decreased gross motor    Cervical / Trunk Assessment Cervical / Trunk Assessment: Kyphotic  Communication   Communication: Expressive difficulties;No difficulties  Cognition Arousal/Alertness: Awake/alert Behavior During Therapy: WFL for tasks assessed/performed Overall Cognitive Status: Impaired/Different from baseline                                 General Comments: pt AxOx4 initially but stating that just before therapy enetered the room that her vision went black and now she has blurriness. During session pt had slowing of speech with some problem solving difficulties and had difficulty recaling the year (after she had previously done it with ease). Pt's RN brought to room and further neuro evaluation done, then rapid response  called        General Comments General comments (skin integrity, edema, etc.): HR in 70's throughout/ Pt with finger to nose coordination deficits on R when checked as well as the reported tingling RLE    Exercises     Assessment/Plan    PT Assessment Patient needs continued PT services  PT Problem List Decreased strength;Decreased activity tolerance;Decreased balance;Decreased mobility;Decreased cognition;Decreased coordination;Pain;Impaired sensation;Decreased knowledge of precautions;Decreased skin integrity;Obesity       PT Treatment Interventions DME instruction;Gait training;Functional mobility training;Therapeutic activities;Therapeutic exercise;Balance training;Neuromuscular re-education;Patient/family education;Cognitive remediation    PT Goals (Current goals can be found in the Care Plan section)  Acute Rehab PT Goals Patient Stated Goal: return home PT Goal Formulation: With patient Time For Goal Achievement: 11/08/22 Potential to Achieve Goals: Good    Frequency Min 3X/week     Co-evaluation               AM-PAC PT "6 Clicks" Mobility  Outcome Measure Help needed turning from your back to your side while in a flat bed without using bedrails?: A Lot Help needed moving from lying on your back to sitting on the side of a flat bed without using bedrails?: Total Help needed moving to and from a bed to a chair (including a wheelchair)?: Total Help needed standing up from a chair using your arms (e.g., wheelchair or bedside  chair)?: Total Help needed to walk in hospital room?: Total Help needed climbing 3-5 steps with a railing? : Total 6 Click Score: 7    End of Session   Activity Tolerance: Treatment limited secondary to medical complications (Comment) (neuro symptoms) Patient left: in bed;with call bell/phone within reach;with bed alarm set;with nursing/sitter in room Nurse Communication: Mobility status;Other (comment) (blurry vision, brief cognitive  changes) PT Visit Diagnosis: Dizziness and giddiness (R42);Pain;Other abnormalities of gait and mobility (R26.89) Pain - Right/Left:  (bilateral) Pain - part of body: Leg    Time: 1610-9604 PT Time Calculation (min) (ACUTE ONLY): 18 min   Charges:   PT Evaluation $PT Eval Moderate Complexity: 1 Mod          Lyanne Co, PT  Acute Rehab Services Secure chat preferred Office [phone]   Lawana Chambers Larron Armor 10/25/2022, 9:53 AM

## 2022-10-25 NOTE — Consult Note (Signed)
Neurology Consultation Reason for Consult: Transient vision loss bilaterally Requesting Physician: Jodean Lima   CC: Sudden onset blackness of vision   History is obtained from: Patient and chart review   HPI: [name] is a 72 y.o. female with a past medical history significant for hypertension, hyperlipidemia, diabetes (uncontrolled with A1c of 8.1%, complicated by neuropathy), prior stroke, anxiety, depression, bipolar disorder, asthma, obesity, right upper extremity tremor, chronic bilateral lower extremity wounds presented initially for drainage, now s/p debridement by orthopedic surgery on 5/10 with wound vacs in place.  Physical therapy had just come into her room to work with her when she reported to them that just before there) she had had sudden total vision loss which was improving.  Her initial blood pressures were in the 160s systolic but it were improving to the 120s spontaneously and her vision loss was resolving with some mild residual purulent redness, mostly in the left eye.  Otherwise she had no other focal deficits or complaints  Her most recent stroke was 06/11/2022 at which time she presented with a right gaze preference, left-sided weakness and was taken for IR thrombectomy of the right M2 (out of the window for TNK).  A1c at the time was 9.5%, LDL 45, echocardiogram notable for normal EF and left atrial dilation.  Loop recorder was recommended prior to discharge but patient declined although she did agree to [employer] patch per discharge summary.  Patient is slightly confused, reports she thought her stroke was not that recent.  LKW: 8:30 AM Thrombolytic given?: Surgical procedure bilateral legs 5/10 IA performed?: No, symptoms are rapidly improving Premorbid modified rankin scale:      4 - Moderately severe disability. Unable to attend to own bodily needs without assistance, and unable to walk unassisted -- wheelchair bound, assists with her transfers  ROS: Otherwise  denies headache or any other significant pain.  No acute complaints, afebrile  Past Medical History:  Diagnosis Date   Anxiety    Arthritis    "back, arms, legs" (03/24/2016)   Asthma    Chronic lower back pain    Colonic polyp    last colonoscopy done in 2009 with normal results per medical record   Depressive disorder    Gastric polyp    Gout    has taken allopurinol 300mg  once daily in past   Headache    History of hiatal hernia    Hypertension    Migraine    "none in awhile; might have a couple/year" (03/24/2016)   Mixed hyperlipidemia    01/2016 Total chol 141, HDL 59, LDL 63, ration 1.1   Osteoarthritis    TIA (transient ischemic attack) 11/2014   Type II diabetes mellitus (HCC)    Past Surgical History:  Procedure Laterality Date   ARTERY BIOPSY Right 08/12/2016   Procedure: BIOPSY TEMPORAL ARTERY;  Surgeon: Sherren Kerns, MD;  Location: [facility] OR;  Service: Vascular;  Laterality: Right;  BIOPSY TEMPORAL ARTERY   BREAST BIOPSY Left ~ 2015   benign   CARPAL TUNNEL RELEASE Bilateral    DILATION AND CURETTAGE OF UTERUS     IR CT HEAD LTD  06/11/2022   IR PERCUTANEOUS ART THROMBECTOMY/INFUSION INTRACRANIAL INC DIAG ANGIO  06/11/2022   IR US GUIDE VASC ACCESS RIGHT  06/11/2022   KNEE ARTHROSCOPY Right 2003   in [facility]   LAPAROSCOPIC CHOLECYSTECTOMY     LOOP RECORDER INSERTION N/A 06/26/2022   Procedure: LOOP RECORDER INSERTION;  Surgeon: Regan Lemming, MD;  Location: [facility] INVASIVE CV LAB;  Service: Cardiovascular;  Laterality: N/A;   RADIOLOGY WITH ANESTHESIA N/A 06/11/2022   Procedure: IR WITH ANESTHESIA;  Surgeon: Radiologist, Medication, MD;  Location: [facility] OR;  Service: Radiology;  Laterality: N/A;   TUBAL LIGATION     VAGINAL HYSTERECTOMY  1982   Current Outpatient Medications  Medication Instructions   acetaminophen (TYLENOL) 650 mg, Oral, Every 6 hours PRN   aspirin EC 81 mg, Oral, Daily, Swallow whole.   atenolol (TENORMIN) 25-50 mg, Oral, See admin  instructions, 50 mg every morning, 25 mg at bedtime   cyclobenzaprine (FLEXERIL) 5 mg, Oral, 3 times daily PRN   donepezil (ARICEPT) 10 mg, Oral, Every morning   gabapentin (NEURONTIN) 600 mg, Oral, 2 times daily   insulin glargine (LANTUS) 30 Units, Subcutaneous, BH-each morning, And pen needles 1/day   lurasidone (LATUDA) 40 mg, Oral, Daily with breakfast   NIFEdipine (PROCARDIA XL/NIFEDICAL-XL) 90 mg, Oral, Every morning   ondansetron (ZOFRAN) 4 mg, Oral, Every 6 hours   traZODone (DESYREL) 50 mg, Oral, at bedtime and repeat x1 PRN     Current Facility-Administered Medications:    0.9 %  sodium chloride infusion, , Intravenous, Continuous, Nadara Mustard, MD, Last Rate: 10 mL/hr at 10/24/22 1510, New Bag at 10/24/22 1510   acetaminophen (TYLENOL) tablet 650 mg, 650 mg, Oral, Q6H PRN, 650 mg at 10/23/22 2120 **OR** acetaminophen (TYLENOL) suppository 650 mg, 650 mg, Rectal, Q6H PRN, Nadara Mustard, MD   acetaminophen (TYLENOL) tablet 325-650 mg, 325-650 mg, Oral, Q6H PRN, Nadara Mustard, MD   albuterol (PROVENTIL) (2.5 MG/3ML) 0.083% nebulizer solution 2.5 mg, 2.5 mg, Nebulization, Q6H PRN, Nadara Mustard, MD, 2.5 mg at 10/25/22 0419   atenolol (TENORMIN) tablet 50 mg, 50 mg, Oral, q morning, 50 mg at 10/24/22 1509 **AND** atenolol (TENORMIN) tablet 25 mg, 25 mg, Oral, QHS, Narda Bonds, MD, 25 mg at 10/24/22 2123   bisacodyl (DULCOLAX) suppository 10 mg, 10 mg, Rectal, Daily PRN, Nadara Mustard, MD   docusate sodium (COLACE) capsule 100 mg, 100 mg, Oral, BID, Nadara Mustard, MD, 100 mg at 10/24/22 2124   enoxaparin (LOVENOX) injection 40 mg, 40 mg, Subcutaneous, Q24H, Nadara Mustard, MD, 40 mg at 10/24/22 1509   gabapentin (NEURONTIN) capsule 300 mg, 300 mg, Oral, BID, Narda Bonds, MD, 300 mg at 10/24/22 2125   HYDROmorphone (DILAUDID) injection 0.5-1 mg, 0.5-1 mg, Intravenous, Q4H PRN, Nadara Mustard, MD, 1 mg at 10/25/22 0227   insulin aspart (novoLOG) injection 0-15 Units, 0-15  Units, Subcutaneous, TID WC, Nadara Mustard, MD, 3 Units at 10/24/22 1819   insulin aspart (novoLOG) injection 0-5 Units, 0-5 Units, Subcutaneous, QHS, Nadara Mustard, MD   insulin glargine-yfgn (SEMGLEE) injection 20 Units, 20 Units, Subcutaneous, Daily, Nadara Mustard, MD   leptospermum manuka honey (MEDIHONEY) paste 1 Application, 1 Application, Topical, Daily, Nadara Mustard, MD, 1 Application at 10/23/22 1259   LORazepam (ATIVAN) tablet 1 mg, 1 mg, Oral, Once PRN, Nadara Mustard, MD   magnesium citrate solution 1 Bottle, 1 Bottle, Oral, Once PRN, Nadara Mustard, MD   methocarbamol (ROBAXIN) tablet 500 mg, 500 mg, Oral, Q6H PRN, 500 mg at 10/24/22 2125 **OR** methocarbamol (ROBAXIN) 500 mg in dextrose 5 % 50 mL IVPB, 500 mg, Intravenous, Q6H PRN, Nadara Mustard, MD   metoCLOPramide (REGLAN) tablet 5-10 mg, 5-10 mg, Oral, Q8H PRN **OR** metoCLOPramide (REGLAN) injection 5-10 mg, 5-10 mg, Intravenous, Q8H PRN, Aldean Baker  V, MD   mineral oil-hydrophilic petrolatum (AQUAPHOR) ointment, , Topical, Daily, Nadara Mustard, MD, Given at 10/23/22 1259   ondansetron (ZOFRAN) tablet 4 mg, 4 mg, Oral, Q6H PRN **OR** ondansetron (ZOFRAN) injection 4 mg, 4 mg, Intravenous, Q6H PRN, Nadara Mustard, MD   oxyCODONE (Oxy IR/ROXICODONE) immediate release tablet 10-15 mg, 10-15 mg, Oral, Q4H PRN, Nadara Mustard, MD, 15 mg at 10/25/22 0419   oxyCODONE (Oxy IR/ROXICODONE) immediate release tablet 5-10 mg, 5-10 mg, Oral, Q4H PRN, Nadara Mustard, MD   polyethylene glycol (MIRALAX / GLYCOLAX) packet 17 g, 17 g, Oral, Daily PRN, Nadara Mustard, MD   Family History  Problem Relation Age of Onset   Stroke Father    Stroke Brother    Cancer Other    Gout Mother    Hypertension Mother    Arthritis Mother    Asthma Mother    Stroke Brother     Social History:  reports that she has never smoked. She has never used smokeless tobacco. She reports that she does not drink alcohol and does not use  drugs.   Exam: Current vital signs: BP 136/65 (BP Location: Right Arm)   Pulse 61   Temp 98.2 F (36.8 C)   Resp 15   Ht 5\' 5"  (1.651 m)   Wt 127.5 kg   SpO2 97%   BMI 46.76 kg/m  Vital signs in last 24 hours: Temp:  [97.5 F (36.4 C)-98.4 F (36.9 C)] 98.2 F (36.8 C) (05/11 0900) Pulse Rate:  [61-101] 61 (05/11 0753) Resp:  [14-26] 15 (05/11 0905) BP: (107-166)/(65-83) 136/65 (05/11 0905) SpO2:  [95 %-99 %] 97 % (05/11 0905) Weight:  [127.5 kg] 127.5 kg (05/10 1010)   Physical Exam  Constitutional: No acute distress  Psych: Affect calm and cooperative  Eyes: No scleral injection HENT: No oropharyngeal obstruction.  MSK/skin: wound vacs in place with serosanguinous drainage bilateral lower extremities; chronic skin changes bilateral LE  Cardiovascular: Perfusing bilateral upper extremities well  Respiratory: Effort normal, non-labored breathing GI: Soft.  No distension. There is no tenderness.    Neuro: Mental Status: Patient is awake, alert, oriented to person, place, month, and situation. Patient is able to give a clear and coherent history. No signs of aphasia or neglect other than mild slowness to respond at times Cranial Nerves: II: Visual Fields are full. Pupils are equal, round, and reactive to light.   III,IV, VI: EOMI without ptosis or diploplia.  V: Facial sensation is symmetric to light touch VII: Facial movement is symmetric.  VIII: hearing is intact to voice X: Uvula difficult to visualize  XII: tongue is midline without atrophy or fasciculations.  Motor: Mild pronation of BUE without drift. Briefly antigravity bilateral lower extremities  Sensory: Sensation is symmetric to light touch in the arms and in proximal legs. Cerebellar: FNF with mild dysmetria bilaterally R > L and HKS too weak to perform Gait:  Non ambulatory at baseline  NIHSS total 6 Score breakdown: 2 for left leg weakness, 2 for right leg weakness, 2 for BUE ataxia   I have  reviewed labs in epic and the results pertinent to this consultation are:  Basic Metabolic Panel: Recent Labs  Lab 10/22/22 2130  NA 136  K 4.3  CL 102  CO2 23  GLUCOSE 174*  BUN 8  CREATININE 0.87  CALCIUM 9.6    CBC: Recent Labs  Lab 10/22/22 1754  WBC 9.2  NEUTROABS 6.0  HGB 12.5  HCT  39.2  MCV 84.7  PLT 406*    Coagulation Studies: No results for input(s): "LABPROT", "INR" in the last 72 hours.   Lab Results  Component Value Date   HGBA1C 8.1 (H) 10/23/2022    Lab Results  Component Value Date   CHOL 112 06/12/2022   HDL 45 06/12/2022   LDLCALC 45 06/12/2022   TRIG 108 06/12/2022   CHOLHDL 2.5 06/12/2022     I have reviewed the images obtained:  Head CT personally reviewed, agree with radiology no acute intracranial process but significant chronic findings that do limit sensitivity  CTA personally reviewed, no emergent LVO, basilar artery is open, there is some notable left P2 stenosis   ECHO 06/25/2022 for elevated troponin  1. Left ventricular ejection fraction, by estimation, is 65 to 70%. The  left ventricle has normal function. The left ventricle has no regional  wall motion abnormalities. There is moderate left ventricular hypertrophy.  Left ventricular diastolic  parameters are consistent with Grade I diastolic dysfunction (impaired  relaxation).   2. Right ventricular systolic function is normal. The right ventricular  size is normal. There is normal pulmonary artery systolic pressure. The  estimated right ventricular systolic pressure is 24.3 mmHg.   3. The mitral valve is normal in structure. Trivial mitral valve  regurgitation. No evidence of mitral stenosis.   4. The aortic valve was not well visualized. There is mild calcification  of the aortic valve. Aortic valve regurgitation is not visualized. No  aortic stenosis is present.   5. Increased flow velocities may be secondary to anemia, thyrotoxicosis,  hyperdynamic or high flow  state.   6. The inferior vena cava is normal in size with greater than 50%  respiratory variability, suggesting right atrial pressure of 3 mmHg.   7. Cannot exclude a small PFO.  [Normal biatrial sizes]  Impression: Sudden onset bilateral vision loss is highly concerning for possible posterior circulation occlusion in this patient with high risk of stroke not on aspirin currently due to her recent surgery with wound vacs in place.  Recommendations: # Possible posterior circulation stroke vs. TIA - Stroke labs fasting lipid panel added on - MRI brain w/o contrast - CTA completed, will follow up radiology read - Frequent neuro checks - Echocardiogram completed recently will not re-order at this time from neuro perspective - Resume antiplatelet (ASA 81 mg daily) if cleared by surgery - Risk factor modification - Telemetry monitoring; if patient has not completed zio would consider vs. rediscussion of loop recorder - Blood pressure goal   - Permissive hypertension to 220/120 due to possible acute stroke. If MRI negative, normotension - PT consult, OT consult, following, speech consult if patient not felt to be at baseline speech - Stroke team to follow, recs conveyed to primary team via secure chat   Brooke Dare MD-PhD [facility] [phone] Available 7 AM to 7 PM, outside these hours please contact Neurologist on call listed on AMION    Total critical care time: 40 minutes   Critical care time was exclusive of separately billable procedures and treating other patients.   Critical care was necessary to treat or prevent imminent or life-threatening deterioration.   Critical care was time spent personally by me on the following activities: development of treatment plan with patient and/or surrogate as well as nursing, discussions with consultants/primary team, evaluation of patient's response to treatment, examination of patient, obtaining history from patient or  surrogate, ordering and performing treatments and interventions, ordering and review  of laboratory studies, ordering and review of radiographic studies, and re-evaluation of patient's condition as needed, as documented above.

## 2022-10-25 NOTE — Significant Event (Addendum)
Rapid Response Event Note   Reason for Call :  Blurry vision  Initial Focused Assessment:  Patient in bed attempting to read menu, though with difficulty. NIH performed without focal deficits. Neuro MD paged, Code Stroke activated 0912.   136/65 (87) HR 75 RR 16 O2 97% RA CBG 174  Patient on 5N unit where she was LKW at 0830 and now complaining of blurry vision. Initial loss of vision with "total blackness" lasting no more than 30 seconds with vision resuming blurry, L > R.   Patient to CT with team. See flowsheets for NIHSS details. The following imaging was completed:  CT Head and CTA. Patient is not a candidate for IV Thrombolytic due to surgical procedure on bilateral legs yesterday 5/10. Patient is not a candidate for IR due to symptoms rapidly improving.   Plan of Care:  Care/Plan: q2h NIH and VS x12 then q4h. Permissive hypertension <220/110. MRI.  Bedside handoff with RN Gust Brooms.    Event Summary:  MD Notified: Rollene Fare MD, Tish Frederickson MD Call Time: [id number] Arrival Time: 9604 End Time: 5409  Truddie Crumble, RN

## 2022-10-25 NOTE — Progress Notes (Signed)
PROGRESS NOTE    [name]  ZOX:[id number] DOB: June 02, 1951 DOA: 10/22/2022 PCP: Salvatore Decent, PA-C   Brief Narrative: [name] is a 72 y.o. female with a history of hypertension, hyperlipidemia, stroke, diabetes mellitus on insulin, anxiety, depression, bipolar disorder, asthma, chronic back pain, dementia inability to use, chronic bilateral lower extremity wounds morbid obesity.  Patient presents secondary to drainage from leg wounds with concern for infection.  On admission patient was noted to have drainage from chronic leg wounds without obvious evidence of infection.  Patient received empiric antibiotics which were discontinued shortly after admission.  Orthopedic surgery consulted and took patient to the OR for debridement. On 5/10, patient developed transient vision loss with subsequent blurry vision; code stroke was called; neurology consulted.   Assessment and Plan:  Chronic bilateral lower extremity wounds No obvious evidence of infection. One ulcer appears to be amenable to debridement. Orthopedic surgery consulted. X-rays negative for osteomyelitis. Excisional debridement of ulcers performed on 5/10; wound vac placed. Wound cultures obtained and appears to have polymicrobial result. -MRI left ankle (ordered but never completed) -Orthopedic surgery recommendations: continue wound vac; okay to start Aspirin -Start Unasyn IV   Diabetes mellitus type 2 Uncontrolled with hyperglycemia. Hemoglobin A1C of 8.1%. Patient states she takes Lantus 100 units daily divided to 80 and 20 unit doses. Blood sugar uncontrolled while admitted. -Semglee 20 units daily -Continue SSI  Transient vision loss Concern for possible stroke. Code stroke called on 5/11. CT head negative for acute stroke and CTA head/neck negative for large vessel occlusion. LDL 69. A1C of 8.1%. Recent Transthoracic Echocardiogram from 06/2022. -Neurology recommendations: MRI brain w/o contrast, Aspirin 81 mg  daily  Diabetic neuropathy Patient is prescribed gabapentin 600 mg twice daily but ports taking gabapentin 300 mg twice daily as an outpatient. -Continue gabapentin 300 mg twice daily  Asthma Stable. - Continue albuterol PRN  Hypertension Patient is managed on nifedipine 90 mg daily and atenolol 50 mg every morning and 25 mg nightly as an outpatient. -Continue home atenolol  History of stroke Patient is on aspirin as an outpatient which was held on admission. -Resume aspirin surgical management  Depression Patient is prescribed trazodone as an outpatient but is listed as not taking at this time.   DVT prophylaxis: Lovenox Code Status:   Code Status: DNR Family Communication: Husband and son at bedside Disposition Plan: Discharge home likely in 2 days pending orthopedic surgery recommendations/management and neurology recommendations   Consultants:  Orthopedic surgery  Procedures:  5/10: Excisional debridement of venous and lymphatic insufficiency ulcers bilateral lower extremities. Skin, soft tissue excised with a 21 blade knife   Antimicrobials: Vancomycin    Subjective: Patient reports no concerns at this time. Her vision has resolved. She reports that she does not feel like she had a stroke. She still has her chronic left sided hemiparesis without new deficits noted.  Objective: BP 133/62 (BP Location: Right Arm)   Pulse 61   Temp 98.2 F (36.8 C)   Resp 18   Ht 5\' 5"  (1.651 m)   Wt 127.5 kg   SpO2 98%   BMI 46.76 kg/m   Examination:  General exam: Appears calm and comfortable Respiratory system: Clear to auscultation. Respiratory effort normal. Cardiovascular system: S1 & S2 heard, RRR. Gastrointestinal system: Abdomen is nondistended, soft and nontender. No organomegaly or masses felt. Normal bowel sounds heard. Central nervous system: Alert and oriented. 4/5 strength on left upper/lower extremities compared to 5/5 on right Musculoskeletal: Wound  vac  placed on bilateral lower extremities Psychiatry: Judgement and insight appear normal. Mood & affect appropriate.    Data Reviewed: I have personally reviewed following labs and imaging studies  CBC Lab Results  Component Value Date   WBC 9.2 10/22/2022   RBC 4.63 10/22/2022   HGB 12.5 10/22/2022   HCT 39.2 10/22/2022   MCV 84.7 10/22/2022   MCH 27.0 10/22/2022   PLT 406 (H) 10/22/2022   MCHC 31.9 10/22/2022   RDW 15.9 (H) 10/22/2022   LYMPHSABS 2.1 10/22/2022   MONOABS 0.6 10/22/2022   EOSABS 0.3 10/22/2022   BASOSABS 0.0 10/22/2022     Last metabolic panel Lab Results  Component Value Date   NA 136 10/22/2022   K 4.3 10/22/2022   CL 102 10/22/2022   CO2 23 10/22/2022   BUN 8 10/22/2022   CREATININE 0.87 10/22/2022   GLUCOSE 174 (H) 10/22/2022   GFRNONAA >60 10/22/2022   GFRAA >60 11/18/2019   CALCIUM 9.6 10/22/2022   PHOS 3.1 06/27/2022   PROT 8.5 (H) 10/22/2022   ALBUMIN 2.8 (L) 10/22/2022   BILITOT 0.5 10/22/2022   ALKPHOS 90 10/22/2022   AST 19 10/22/2022   ALT 11 10/22/2022   ANIONGAP 11 10/22/2022    GFR: Estimated Creatinine Clearance: 77.5 mL/min (by C-G formula based on SCr of 0.87 mg/dL).  Recent Results (from the past 240 hour(s))  Surgical pcr screen     Status: Abnormal   Collection Time: 10/24/22  6:58 AM   Specimen: Nasal Mucosa; Nasal Swab  Result Value Ref Range Status   MRSA, PCR NEGATIVE NEGATIVE Final   Staphylococcus aureus POSITIVE (A) NEGATIVE Final    Comment: (NOTE) The Xpert SA Assay (FDA approved for NASAL specimens in patients 72 years of age and older), is one component of a comprehensive surveillance program. It is not intended to diagnose infection nor to guide or monitor treatment. Performed at [facility], [address] [place], [place] [address]   Aerobic/Anaerobic Culture w Gram Stain (surgical/deep wound)     Status: None (Preliminary result)   Collection Time: 10/24/22 11:19 AM   Specimen: Soft Tissue,  Other  Result Value Ref Range Status   Specimen Description TISSUE  Final   Special Requests SOFT TISS RT ANKLE  Final   Gram Stain   Final    MODERATE SQUAMOUS EPITHELIAL CELLS PRESENT ABUNDANT GRAM POSITIVE COCCI ABUNDANT GRAM POSITIVE RODS FEW GRAM NEGATIVE RODS    Culture   Final    CULTURE REINCUBATED FOR BETTER GROWTH Performed at [facility], [address] [place], [place] [address]    Report Status PENDING  Incomplete  Aerobic/Anaerobic Culture w Gram Stain (surgical/deep wound)     Status: None (Preliminary result)   Collection Time: 10/24/22 11:23 AM   Specimen: Soft Tissue, Other  Result Value Ref Range Status   Specimen Description TISSUE  Final   Special Requests SOFT TIS LT LEG  Final   Gram Stain   Final    ABUNDANT SQUAMOUS EPITHELIAL CELLS PRESENT FEW WBC PRESENT, PREDOMINANTLY PMN MODERATE GRAM POSITIVE RODS MODERATE GRAM POSITIVE COCCI FEW GRAM NEGATIVE RODS    Culture   Final    CULTURE REINCUBATED FOR BETTER GROWTH Performed at [facility], [address] [place], [place] [address]    Report Status PENDING  Incomplete      Radiology Studies: CT ANGIO HEAD NECK W WO CM (CODE STROKE)  Result Date: 10/25/2022 CLINICAL DATA:  Neuro deficit, bilateral vision loss. EXAM: CT ANGIOGRAPHY HEAD AND NECK WITH AND WITHOUT CONTRAST TECHNIQUE: Multidetector CT imaging of the head and neck was performed using the standard protocol during bolus administration of intravenous contrast. Multiplanar CT image reconstructions and MIPs were obtained to evaluate the vascular anatomy. Carotid stenosis measurements (when applicable) are obtained utilizing NASCET criteria, using the distal internal carotid diameter as the denominator. RADIATION DOSE REDUCTION: This exam was performed according to the departmental dose-optimization program which includes automated exposure control, adjustment of the mA and/or kV according to patient size and/or use of iterative  reconstruction technique. CONTRAST:  75mL OMNIPAQUE IOHEXOL 350 MG/ML SOLN COMPARISON:  06/11/2022 FINDINGS: CTA NECK FINDINGS Aortic arch: Normal Right carotid system: No evidence of dissection, stenosis (50% or greater), or occlusion. Left carotid system: No evidence of dissection, stenosis (50% or greater), or occlusion. Vertebral arteries: Codominant. No evidence of dissection, stenosis (50% or greater), or occlusion. Skeleton: Unremarkable Other neck: Unremarkable Upper chest: Clear apical lungs Review of the MIP images confirms the above findings CTA HEAD FINDINGS Anterior circulation: Atheromatous calcification of the carotid siphons. Right MCA branch irregularity correlating with a chronic infarct. No emergent large vessel occlusion, proximal flow limiting stenosis, or aneurysm. Posterior circulation: Vertebrobasilar tortuosity without stenosis, branch occlusion, beading, or aneurysm. Venous sinuses: Diffusely patent Anatomic variants: None significant Review of the MIP images confirms the above findings IMPRESSION: No emergent finding. No flow limiting stenosis of major arteries in the head and neck. Electronically Signed   By: Tiburcio Pea M.D.   On: 10/25/2022 09:50   CT HEAD CODE STROKE WO CONTRAST  Result Date: 10/25/2022 CLINICAL DATA:  Code stroke.  Sudden bilateral vision loss. EXAM: CT HEAD WITHOUT CONTRAST TECHNIQUE: Contiguous axial images were obtained from the base of the skull through the vertex without intravenous contrast. RADIATION DOSE REDUCTION: This exam was performed according to the departmental dose-optimization program which includes automated exposure control, adjustment of the mA and/or kV according to patient size and/or use of iterative reconstruction technique. COMPARISON:  07/16/2022 FINDINGS: Brain: No evidence of acute infarction, hemorrhage, hydrocephalus, extra-axial collection or mass lesion/mass effect. Chronic right MCA branch infarct centered along the lateral  right frontal lobe. Vascular: No hyperdense vessel or unexpected calcification. Skull: Normal. Negative for fracture or focal lesion. Sinuses/Orbits: No acute finding. Other: These results were communicated to Dr. Iver Nestle at 9:44 am on 10/25/2022 by text page via the [facility] messaging system. ASPECTS [facility] Stroke Program Early CT Score) Not scored with this history. IMPRESSION: No acute finding.  Remote right MCA branch infarct. Electronically Signed   By: Tiburcio Pea M.D.   On: 10/25/2022 09:44   VAS [place] ABI WITH/WO TBI  Result Date: 10/23/2022  LOWER EXTREMITY DOPPLER STUDY Patient Name:  GWENDOLYNNE DEBONIS  Date of Exam:   10/23/2022 Medical Rec #: [id number]        Accession #:    [id number] Date of Birth: 05-Jun-1951       Patient Gender: F Patient Age:   30 years Exam Location:  [facility] Procedure:      VAS [place] ABI WITH/WO TBI Referring Phys: Ulyess Blossom RATHORE --------------------------------------------------------------------------------  Indications: Chronic venous stasis ulcers x many years. Difficult to palpate              pulses on clinical exam due to skin changes. High Risk Factors: Hyperlipidemia, Diabetes, prior CVA.  Limitations: Technically difficult exam secondary to recently applied bandaging  to bilateral ankles/feet, patient body habitus, chronic skin              changes, and venous interference. Comparison Study: Numerous prior normal ABI studies. Most recent ABI performed                   05-27-2021 at outside facility. Most recent ABI performed in                   network 12-15-2018 was within normal limits with triphasic                   pulses bilaterally and normal ABI and TBI values. Performing Technologist: Jean Rosenthal RDMS RVT  Examination Guidelines: A complete evaluation includes at minimum, Doppler waveform signals and systolic blood pressure reading at the level of bilateral brachial, anterior tibial, and posterior tibial arteries, when vessel segments  are accessible. Bilateral testing is considered an integral part of a complete examination. Photoelectric Plethysmograph (PPG) waveforms and toe systolic pressure readings are included as required and additional duplex testing as needed. Limited examinations for reoccurring indications may be performed as noted.  ABI Findings: +---------+------------------+-----+---------+---------------------------------+ Right    Rt Pressure (mmHg)IndexWaveform Comment                           +---------+------------------+-----+---------+---------------------------------+ Brachial 124                    triphasic                                  +---------+------------------+-----+---------+---------------------------------+ PTA                                      Unable to insonate due to                                                  recently applied bandaging.       +---------+------------------+-----+---------+---------------------------------+ DP       183               1.38 triphasic                                  +---------+------------------+-----+---------+---------------------------------+ Great Toe110               0.83 Normal                                     +---------+------------------+-----+---------+---------------------------------+ +---------+------------------+-----+-----------+-------------------------------+ Left     Lt Pressure (mmHg)IndexWaveform   Comment                         +---------+------------------+-----+-----------+-------------------------------+ Brachial 133                    triphasic                                  +---------+------------------+-----+-----------+-------------------------------+ PTA  Unable to insonate due to                                                  recently applied bandaging.     +---------+------------------+-----+-----------+-------------------------------+  DP       167               1.26 triphasic                                  +---------+------------------+-----+-----------+-------------------------------+ Great Toe89                0.67 Near normalCircumference of great toe                                                 limits accuracy of pressures.   +---------+------------------+-----+-----------+-------------------------------+  Summary: Right: Resting right ankle-brachial index indicates noncompressible right lower extremity arteries. The right toe-brachial index is normal. Left: Resting left ankle-brachial index is within normal range. The left toe-brachial index is near normal. *See table(s) above for measurements and observations.  Electronically signed by Coral Else MD on 10/23/2022 at 9:48:57 PM.    Final       LOS: 2 days    Jacquelin Hawking, MD [facility] 10/25/2022, 10:39 AM   If 7PM-7AM, please contact night-coverage [email or web address]

## 2022-10-26 DIAGNOSIS — H543 Unqualified visual loss, both eyes: Secondary | ICD-10-CM | POA: Diagnosis not present

## 2022-10-26 DIAGNOSIS — E11622 Type 2 diabetes mellitus with other skin ulcer: Secondary | ICD-10-CM | POA: Diagnosis not present

## 2022-10-26 DIAGNOSIS — L03119 Cellulitis of unspecified part of limb: Secondary | ICD-10-CM | POA: Diagnosis not present

## 2022-10-26 DIAGNOSIS — L97929 Non-pressure chronic ulcer of unspecified part of left lower leg with unspecified severity: Secondary | ICD-10-CM | POA: Diagnosis not present

## 2022-10-26 LAB — GLUCOSE, CAPILLARY
Glucose-Capillary: 142 mg/dL — ABNORMAL HIGH (ref 70–99)
Glucose-Capillary: 211 mg/dL — ABNORMAL HIGH (ref 70–99)
Glucose-Capillary: 127 mg/dL — ABNORMAL HIGH (ref 70–99)

## 2022-10-26 LAB — AEROBIC/ANAEROBIC CULTURE W GRAM STAIN (SURGICAL/DEEP WOUND)

## 2022-10-26 MED ORDER — ROSUVASTATIN CALCIUM 5 MG PO TABS
10.0000 mg | ORAL_TABLET | Freq: Every day | ORAL | Status: DC
  Administered 2022-10-27 – 2022-10-29 (×3): 10 mg via ORAL
  Filled 2022-10-26 (×3): qty 2

## 2022-10-26 NOTE — TOC Initial Note (Signed)
Transition of Care [facility]) - Initial/Assessment Note    Patient Details  Name: [name] MRN: [id number] Date of Birth: 1950/10/02  Transition [facility]) CM/SW Contact:    Verna Czech [place], [place] Phone Number: 10/26/2022, 10:41 AM  Clinical Narrative:                 This Child psychotherapist met with patient and her spouse at bedside to discuss recommendation for SNF. Patient resides with her spouse, stating that she currently has an [facility] nurse that comes 2x per week to change her dressings.  Patient is agreeable to SNF stay and would like her first choice to be [facility]. SNF process discussed, bed offers to be provided to patient and spouse once received for choice.  Transition of Care to continue to follow  Mandalyn Pasqua, LCSW Transition of Care    Expected Discharge Plan: Skilled Nursing Facility Barriers to Discharge: Continued Medical Work up   Patient Goals and CMS Choice Patient states their goals for this hospitalization and ongoing recovery are:: "I would like to get stronger before I go home" CMS Medicare.gov Compare Post Acute Care list provided to:: Patient        Expected Discharge Plan and Services In-house Referral: Clinical Social Work   Post Acute Care Choice: Skilled Nursing Facility Living arrangements for the past 2 months: Single Family Home                                      Prior Living Arrangements/Services Living arrangements for the past 2 months: Single Family Home Lives with:: Spouse   Do you feel safe going back to the place where you live?: Yes      Need for Family Participation in Patient Care: No (Comment) Care giver support system in place?: Yes (comment) Current home services: DME, Homehealth aide (power wheelchair, cain and walker, trapeze over the bed home health nurse- 2x a week for 1 hour to wash and rehab legs([facility])) Criminal Activity/Legal Involvement Pertinent to Current Situation/Hospitalization:  No - Comment as needed  Activities of Daily Living Home Assistive Devices/Equipment: Cane (specify quad or straight) ADL Screening (condition at time of admission) Patient's cognitive ability adequate to safely complete daily activities?: Yes Is the patient deaf or have difficulty hearing?: No Does the patient have difficulty seeing, even when wearing glasses/contacts?: No Does the patient have difficulty concentrating, remembering, or making decisions?: No Patient able to express need for assistance with ADLs?: Yes Does the patient have difficulty dressing or bathing?: No Independently performs ADLs?: Yes (appropriate for developmental age) Communication: Independent Dressing (OT): Needs assistance Is this a change from baseline?: Pre-admission baseline Grooming: Independent Feeding: Independent Bathing: Needs assistance Is this a change from baseline?: Pre-admission baseline Toileting: Independent In/Out Bed: Independent Walks in Home: Needs assistance Is this a change from baseline?: Pre-admission baseline Does the patient have difficulty walking or climbing stairs?: Yes Weakness of Legs: Both Weakness of Arms/Hands: None  Permission Sought/Granted      Share Information with NAME: Leroy Sea  [phone]           Emotional Assessment Appearance:: Appears stated age Attitude/Demeanor/Rapport: Engaged Affect (typically observed): Accepting, Adaptable Orientation: : Oriented to Self, Oriented to Place, Oriented to  Time Alcohol / Substance Use: Not Applicable Psych Involvement: No (comment)  Admission diagnosis:  Non-healing wound of lower extremity [S81.809A] Cellulitis of lower extremity, unspecified laterality [L03.119]  Diabetic ulcer of left lower leg (HCC) [H47.425, L97.929] Patient Active Problem List   Diagnosis Date Noted   Diabetic ulcer of left lower leg (HCC) 10/24/2022   Chronic venous hypertension (idiopathic) with ulcer and inflammation of bilateral  lower extremity (HCC) 10/24/2022   Non-healing wound of lower extremity 10/23/2022   Bacteria in urine 06/26/2022   Stasis ulcer of lower extremity (HCC) 06/26/2022   Elevated troponin 06/25/2022   Abnormal urinalysis 06/25/2022   History of CVA (cerebrovascular accident) 06/25/2022   Memory loss 06/25/2022   Cryptogenic stroke (HCC) 06/11/2022   Middle cerebral artery embolism, 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   Wound infection 02/05/2021   Venous stasis dermatitis of both lower extremities    Pneumonia due to COVID-19 virus 06/29/2019   Hypophosphatemia 06/24/2019   Obesity 06/23/2019   Insulin dependent type 2 diabetes mellitus (HCC) 06/22/2019   Anxiety 06/22/2019   Depressive disorder 06/22/2019   Essential hypertension 06/22/2019   HLD (hyperlipidemia) 06/22/2019   COVID-19 06/21/2019   Severe protein-calorie malnutrition (HCC) 06/21/2019   Acute respiratory failure with hypoxia (HCC) 06/21/2019   Diabetic foot ulcer associated with type 2 diabetes mellitus (HCC)    Cellulitis 07/05/2016   Depression with anxiety 05/30/2016   Chronic skin ulcer of lower leg (HCC) 05/30/2016   Generalized weakness 05/30/2016   Left arm weakness 05/30/2016   Left Lower extremity pain  04/02/2016   Lower extremity pain, inferior, left 04/02/2016   Left leg pain    Personal history of noncompliance with medical treatment, presenting hazards to health 03/31/2016   Jaw pain 03/27/2016   Left leg cellulitis 03/24/2016   Cellulitis of left lower extremity    Primary insomnia    Adjustment disorder    Morbid obesity (HCC) 01/27/2016   Wheelchair dependent 01/27/2016   Gout 01/27/2016   Asthma 01/27/2016   Arthritis 01/27/2016   Uncontrolled type 2 diabetes mellitus with hypoglycemia, with long-term current use of insulin (HCC) 01/25/2016   Cellulitis of left leg 01/11/2016   Insulin-requiring or dependent type II diabetes mellitus (HCC) 01/11/2016   PCP:   Salvatore Decent, PA-C Pharmacy:   [facility] - [place], [place] - [address] [place] [address] Phone: [phone] Fax: [phone]  [id number] [place], [place] - [address] AT [address] [place] [address] Phone: [phone] Fax: [phone]  [facility] [address] [place] [address] Phone: [phone] Fax: [phone]  [id number] [place], [place] - [address] AT [facility] [address] [place] [address] Phone: [phone] Fax: [phone]     Social Determinants of Health (SDOH) Social History: SDOH Screenings   Food Insecurity: No Food Insecurity (10/23/2022)  Housing: Low Risk  (10/23/2022)  Transportation Needs: No Transportation Needs (10/23/2022)  Utilities: Not At Risk (10/23/2022)  Tobacco Use: Low Risk  (10/25/2022)   SDOH Interventions:     Readmission Risk Interventions     No data to display

## 2022-10-26 NOTE — Progress Notes (Signed)
PROGRESS NOTE    [name]  ZOX:[id number] DOB: 1951/03/30 DOA: 10/22/2022 PCP: Salvatore Decent, PA-C   Brief Narrative: [name] is a 72 y.o. female with a history of hypertension, hyperlipidemia, stroke, diabetes mellitus on insulin, anxiety, depression, bipolar disorder, asthma, chronic back pain, dementia inability to use, chronic bilateral lower extremity wounds morbid obesity.  Patient presents secondary to drainage from leg wounds with concern for infection.  On admission patient was noted to have drainage from chronic leg wounds without obvious evidence of infection.  Patient received empiric antibiotics which were discontinued shortly after admission.  Orthopedic surgery consulted and took patient to the OR for debridement. On 5/10, patient developed transient vision loss with subsequent blurry vision; code stroke was called; neurology consulted.   Assessment and Plan:  Chronic bilateral lower extremity wounds No obvious evidence of infection. One ulcer appears to be amenable to debridement. Orthopedic surgery consulted. X-rays negative for osteomyelitis. Excisional debridement of ulcers performed on 5/10; wound vac placed. Wound cultures obtained and appears to have polymicrobial result. -Orthopedic surgery recommendations: continue wound vac; okay to start Aspirin -Continue Unasyn IV   Diabetes mellitus type 2 Uncontrolled with hyperglycemia. Hemoglobin A1C of 8.1%. Patient states she takes Lantus 100 units daily divided to 80 and 20 unit doses. Blood sugar uncontrolled while admitted. -Semglee 20 units daily -Continue SSI  Transient vision loss Concern for possible stroke. Code stroke called on 5/11. CT head negative for acute stroke and CTA head/neck negative for large vessel occlusion. LDL 69. A1C of 8.1%. Recent Transthoracic Echocardiogram from 06/2022. MRI brain without evidence of infarct. -Neurology recommendations: 81 mg daily  Diabetic neuropathy Patient  is prescribed gabapentin 600 mg twice daily but ports taking gabapentin 300 mg twice daily as an outpatient. -Continue gabapentin 300 mg twice daily  Asthma Stable. - Continue albuterol PRN  Hypertension Patient is managed on nifedipine 90 mg daily and atenolol 50 mg every morning and 25 mg nightly as an outpatient. -Continue home atenolol  History of stroke Patient is on aspirin as an outpatient which was held on admission. -Resume aspirin surgical management  Depression Patient is prescribed trazodone as an outpatient but is listed as not taking at this time.   DVT prophylaxis: Lovenox Code Status:   Code Status: DNR Family Communication: Husband and son at bedside Disposition Plan: Discharge home likely in 2 days pending orthopedic surgery recommendations/management and neurology recommendations   Consultants:  Orthopedic surgery Neurology  Procedures:  5/10: Excisional debridement of venous and lymphatic insufficiency ulcers bilateral lower extremities. Skin, soft tissue excised with a 21 blade knife   Antimicrobials: Vancomycin Unasyn   Subjective: Patient feels well and full of energy today. No concerns this morning.  Objective: BP (!) 125/59 (BP Location: Right Arm)   Pulse 73   Temp 98 F (36.7 C) (Oral)   Resp 16   Ht 5\' 5"  (1.651 m)   Wt 127.5 kg   SpO2 98%   BMI 46.76 kg/m   Examination:  General exam: Appears calm and comfortable Respiratory system: Clear to auscultation. Respiratory effort normal. Cardiovascular system: S1 & S2 heard, RRR. Gastrointestinal system: Abdomen is nondistended, soft and nontender. Normal bowel sounds heard. Central nervous system: Alert and oriented. No focal neurological deficits. Skin: Bilateral LE wound vacs noted Psychiatry: Judgement and insight appear normal. Mood & affect appropriate.    Data Reviewed: I have personally reviewed following labs and imaging studies  CBC Lab Results  Component Value Date  WBC 9.9 10/25/2022   RBC 4.66 10/25/2022   HGB 12.1 10/25/2022   HCT 39.8 10/25/2022   MCV 85.4 10/25/2022   MCH 26.0 10/25/2022   PLT 413 (H) 10/25/2022   MCHC 30.4 10/25/2022   RDW 15.8 (H) 10/25/2022   LYMPHSABS 2.1 10/22/2022   MONOABS 0.6 10/22/2022   EOSABS 0.3 10/22/2022   BASOSABS 0.0 10/22/2022     Last metabolic panel Lab Results  Component Value Date   NA 133 (L) 10/25/2022   K 4.0 10/25/2022   CL 99 10/25/2022   CO2 25 10/25/2022   BUN 9 10/25/2022   CREATININE 0.92 10/25/2022   GLUCOSE 182 (H) 10/25/2022   GFRNONAA >60 10/25/2022   GFRAA >60 11/18/2019   CALCIUM 9.3 10/25/2022   PHOS 3.1 06/27/2022   PROT 8.5 (H) 10/22/2022   ALBUMIN 2.8 (L) 10/22/2022   BILITOT 0.5 10/22/2022   ALKPHOS 90 10/22/2022   AST 19 10/22/2022   ALT 11 10/22/2022   ANIONGAP 9 10/25/2022    GFR: Estimated Creatinine Clearance: 73.3 mL/min (by C-G formula based on SCr of 0.92 mg/dL).  Recent Results (from the past 240 hour(s))  Surgical pcr screen     Status: Abnormal   Collection Time: 10/24/22  6:58 AM   Specimen: Nasal Mucosa; Nasal Swab  Result Value Ref Range Status   MRSA, PCR NEGATIVE NEGATIVE Final   Staphylococcus aureus POSITIVE (A) NEGATIVE Final    Comment: (NOTE) The Xpert SA Assay (FDA approved for NASAL specimens in patients 62 years of age and older), is one component of a comprehensive surveillance program. It is not intended to diagnose infection nor to guide or monitor treatment. Performed at [facility], [address], [place], [place] [address]   Aerobic/Anaerobic Culture w Gram Stain (surgical/deep wound)     Status: None (Preliminary result)   Collection Time: 10/24/22 11:19 AM   Specimen: Soft Tissue, Other  Result Value Ref Range Status   Specimen Description TISSUE  Final   Special Requests SOFT TISS RT ANKLE  Final   Gram Stain   Final    MODERATE SQUAMOUS EPITHELIAL CELLS PRESENT ABUNDANT GRAM POSITIVE COCCI ABUNDANT GRAM  POSITIVE RODS FEW GRAM NEGATIVE RODS    Culture   Final    FEW PROTEUS MIRABILIS MIXED CULTURE ISOLATING ORGANISMS Performed at [facility], [address] [place], [place] [address]    Report Status PENDING  Incomplete  Aerobic/Anaerobic Culture w Gram Stain (surgical/deep wound)     Status: None (Preliminary result)   Collection Time: 10/24/22 11:23 AM   Specimen: Soft Tissue, Other  Result Value Ref Range Status   Specimen Description TISSUE  Final   Special Requests SOFT TIS LT LEG  Final   Gram Stain   Final    ABUNDANT SQUAMOUS EPITHELIAL CELLS PRESENT FEW WBC PRESENT, PREDOMINANTLY PMN MODERATE GRAM POSITIVE RODS MODERATE GRAM POSITIVE COCCI FEW GRAM NEGATIVE RODS    Culture   Final    FEW PHYSICAL MED AND REHABILITATION MIXED CULTURE ISOLATING ORGANISMS Performed at [facility], [address] [place], [place] [address]    Report Status PENDING  Incomplete      Radiology Studies: MR ANKLE LEFT W WO CONTRAST  Result Date: 10/26/2022 CLINICAL DATA:  Lower extremity wounds EXAM: MRI OF THE LEFT ANKLE WITHOUT AND WITH CONTRAST TECHNIQUE: Multiplanar, multisequence MR imaging of the ankle was performed before and after the administration of intravenous contrast. CONTRAST:  10mL GADAVIST GADOBUTROL 1  MMOL/ML IV SOLN COMPARISON:  Radiograph 10/22/2022 FINDINGS: TENDONS Peroneal: Intact peroneus longus and peroneus brevis tendons. Posteromedial: Intact tibialis posterior, flexor hallucis longus and flexor digitorum longus tendons. Anterior: Intact tibialis anterior, extensor hallucis longus and extensor digitorum longus tendons. Achilles: Intact Achilles tendon with insertional enthesophyte formation. Plantar Fascia: There is thickening of the central band plantar fascia proximally without increased signal, compatible with chronic/prior plantar fasciitis. Adjacent plantar calcaneal spur. LIGAMENTS Lateral: The ATFL is thin but intact. Calcaneofibular ligament intact.  Posterior talofibular ligament intact. Anterior and posterior tibiofibular ligaments intact. Medial: Deltoid ligament intact. Spring ligament intact. CARTILAGE Ankle Joint: No significant joint effusion. No osteochondral defect. 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 Subtalar Joints/Sinus Tarsi: No significant joint effusion. Normal sinus tarsi. Mild posterior subtalar osteoarthritis. Bones: Minimal subchondral marrow edema of the tibiotalar joint related to arthritis. There is no other significant marrow signal alteration. No evidence of acute fracture. Soft Tissue: There is diffuse skin thickening with enhancement and relatively mild subcutaneous soft tissue swelling. There is no organized fluid collection. IMPRESSION: IMPRESSION Diffuse skin thickening and enhancement with mild subcutaneous soft tissue swelling, consistent with venous insufficiency/lymphedema and/or cellulitis. No evidence of osteomyelitis or soft tissue abscess. Electronically Signed   By: Caprice Renshaw M.D.   On: 10/26/2022 09:31   MR BRAIN WO CONTRAST  Result Date: 10/26/2022 CLINICAL DATA:  Acute neurologic deficit EXAM: MRI HEAD WITHOUT CONTRAST TECHNIQUE: Multiplanar, multiecho pulse sequences of the brain and surrounding structures were obtained without intravenous contrast. COMPARISON:  06/12/2022 FINDINGS: Brain: Fading diffusion abnormality at the site of right MCA territory infarct visible on the study of 06/12/2022. No acute infarct. Hemosiderin deposition at old infarct site. There is multifocal hyperintense T2-weighted signal within the white matter. Parenchymal volume and CSF spaces are normal. The midline structures are normal. Vascular: Major flow voids are preserved. Skull and upper cervical spine: Normal calvarium and skull base. Visualized upper cervical spine and soft tissues are normal. Sinuses/Orbits:No paranasal sinus fluid levels or advanced mucosal thickening. No mastoid or middle ear effusion. Normal orbits.  IMPRESSION: 1. No acute intracranial abnormality. 2. Fading diffusion abnormality at the site of right MCA territory infarct visible on the study of 06/12/2022. Electronically Signed   By: Deatra Robinson M.D.   On: 10/26/2022 01:44   CT ANGIO HEAD NECK W WO CM (CODE STROKE)  Result Date: 10/25/2022 CLINICAL DATA:  Neuro deficit, bilateral vision loss. EXAM: CT ANGIOGRAPHY HEAD AND NECK WITH AND WITHOUT CONTRAST TECHNIQUE: Multidetector CT imaging of the head and neck was performed using the standard protocol during bolus administration of intravenous contrast. Multiplanar CT image reconstructions and MIPs were obtained to evaluate the vascular anatomy. Carotid stenosis measurements (when applicable) are obtained utilizing NASCET criteria, using the distal internal carotid diameter as the denominator. RADIATION DOSE REDUCTION: This exam was performed according to the departmental dose-optimization program which includes automated exposure control, adjustment of the mA and/or kV according to patient size and/or use of iterative reconstruction technique. CONTRAST:  75mL OMNIPAQUE IOHEXOL 350 MG/ML SOLN COMPARISON:  06/11/2022 FINDINGS: CTA NECK FINDINGS Aortic arch: Normal Right carotid system: No evidence of dissection, stenosis (50% or greater), or occlusion. Left carotid system: No evidence of dissection, stenosis (50% or greater), or occlusion. Vertebral arteries: Codominant. No evidence of dissection, stenosis (50% or greater), or occlusion. Skeleton: Unremarkable Other neck: Unremarkable Upper chest: Clear apical lungs Review of the MIP images confirms the above findings CTA HEAD FINDINGS Anterior circulation: Atheromatous calcification of the carotid siphons. Right MCA branch  irregularity correlating with a chronic infarct. No emergent large vessel occlusion, proximal flow limiting stenosis, or aneurysm. Posterior circulation: Vertebrobasilar tortuosity without stenosis, branch occlusion, beading, or  aneurysm. Venous sinuses: Diffusely patent Anatomic variants: None significant Review of the MIP images confirms the above findings IMPRESSION: No emergent finding. No flow limiting stenosis of major arteries in the head and neck. Electronically Signed   By: Tiburcio Pea M.D.   On: 10/25/2022 09:50   CT HEAD CODE STROKE WO CONTRAST  Result Date: 10/25/2022 CLINICAL DATA:  Code stroke.  Sudden bilateral vision loss. EXAM: CT HEAD WITHOUT CONTRAST TECHNIQUE: Contiguous axial images were obtained from the base of the skull through the vertex without intravenous contrast. RADIATION DOSE REDUCTION: This exam was performed according to the departmental dose-optimization program which includes automated exposure control, adjustment of the mA and/or kV according to patient size and/or use of iterative reconstruction technique. COMPARISON:  07/16/2022 FINDINGS: Brain: No evidence of acute infarction, hemorrhage, hydrocephalus, extra-axial collection or mass lesion/mass effect. Chronic right MCA branch infarct centered along the lateral right frontal lobe. Vascular: No hyperdense vessel or unexpected calcification. Skull: Normal. Negative for fracture or focal lesion. Sinuses/Orbits: No acute finding. Other: These results were communicated to Dr. Iver Nestle at 9:44 am on 10/25/2022 by text page via the [facility] messaging system. ASPECTS [facility] Stroke Program Early CT Score) Not scored with this history. IMPRESSION: No acute finding.  Remote right MCA branch infarct. Electronically Signed   By: Tiburcio Pea M.D.   On: 10/25/2022 09:44      LOS: 3 days    Jacquelin Hawking, MD [facility] 10/26/2022, 2:27 PM   If 7PM-7AM, please contact night-coverage [email or web address]

## 2022-10-26 NOTE — [place] FL2 (Signed)
[place] MEDICAID FL2 LEVEL OF CARE FORM     IDENTIFICATION  Patient Name: [name] Birthdate: 1951-03-16 Sex: female Admission Date (Current Location): 10/22/2022  [facility] and [place] Number:  Producer, television/film/video and Address:  [facility] [place], [address], [place], [place] [address]      Provider Number: [id number]  Attending Physician Name and Address:  Narda Bonds, MD  Relative Name and Phone Number:  Jaspers,Ezekiel (Spouse) [phone]    Current Level of Care: SNF Recommended Level of Care: Skilled Nursing Facility Prior Approval Number:    Date Approved/Denied:   PASRR Number: [id number]  Discharge Plan: SNF    Current Diagnoses: Patient Active Problem List   Diagnosis Date Noted   Diabetic ulcer of left lower leg (HCC) 10/24/2022   Chronic venous hypertension (idiopathic) with ulcer and inflammation of bilateral lower extremity (HCC) 10/24/2022   Non-healing wound of lower extremity 10/23/2022   Bacteria in urine 06/26/2022   Stasis ulcer of lower extremity (HCC) 06/26/2022   Elevated troponin 06/25/2022   Abnormal urinalysis 06/25/2022   History of CVA (cerebrovascular accident) 06/25/2022   Memory loss 06/25/2022   Cryptogenic stroke (HCC) 06/11/2022   Middle cerebral artery embolism, 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   Wound infection 02/05/2021   Venous stasis dermatitis of both lower extremities    Pneumonia due to COVID-19 virus 06/29/2019   Hypophosphatemia 06/24/2019   Obesity 06/23/2019   Insulin dependent type 2 diabetes mellitus (HCC) 06/22/2019   Anxiety 06/22/2019   Depressive disorder 06/22/2019   Essential hypertension 06/22/2019   HLD (hyperlipidemia) 06/22/2019   COVID-19 06/21/2019   Severe protein-calorie malnutrition (HCC) 06/21/2019   Acute respiratory failure with hypoxia (HCC) 06/21/2019   Diabetic foot ulcer associated with type 2 diabetes mellitus (HCC)     Cellulitis 07/05/2016   Depression with anxiety 05/30/2016   Chronic skin ulcer of lower leg (HCC) 05/30/2016   Generalized weakness 05/30/2016   Left arm weakness 05/30/2016   Left Lower extremity pain  04/02/2016   Lower extremity pain, inferior, left 04/02/2016   Left leg pain    Personal history of noncompliance with medical treatment, presenting hazards to health 03/31/2016   Jaw pain 03/27/2016   Left leg cellulitis 03/24/2016   Cellulitis of left lower extremity    Primary insomnia    Adjustment disorder    Morbid obesity (HCC) 01/27/2016   Wheelchair dependent 01/27/2016   Gout 01/27/2016   Asthma 01/27/2016   Arthritis 01/27/2016   Uncontrolled type 2 diabetes mellitus with hypoglycemia, with long-term current use of insulin (HCC) 01/25/2016   Cellulitis of left leg 01/11/2016   Insulin-requiring or dependent type II diabetes mellitus (HCC) 01/11/2016    Orientation RESPIRATION BLADDER Height & Weight     Self, Time, Situation, Place  Normal Continent Weight: 281 lb (127.5 kg) Height:  5\' 5"  (165.1 cm)  BEHAVIORAL SYMPTOMS/MOOD NEUROLOGICAL BOWEL NUTRITION STATUS      Continent Diet  AMBULATORY STATUS COMMUNICATION OF NEEDS Skin   Extensive Assist Verbally Normal                       Personal Care Assistance Level of Assistance  Bathing, Feeding, Dressing Bathing Assistance: Limited assistance Feeding assistance: Independent Dressing Assistance: Limited assistance     Functional Limitations Info  Sight, Hearing, Speech Sight Info: Adequate Hearing Info: Adequate Speech Info: Adequate    SPECIAL CARE FACTORS FREQUENCY  PT (By licensed PT), OT (By licensed OT)     PT Frequency: 5x per week OT Frequency: 5x per week            Contractures Contractures Info: Not present    Additional Factors Info  Code Status, Allergies Code Status Info: DNR Allergies Info: Ace inhibitor-swelling, latex- itching, swelling, Diprivan-itching, Ultram,  nausea-vomiting, metformin-tremors, chills, tizanide           Current Medications (10/26/2022):  This is the current hospital active medication list Current Facility-Administered Medications  Medication Dose Route Frequency Provider Last Rate Last Admin   0.9 %  sodium chloride infusion   Intravenous Continuous Nadara Mustard, MD 10 mL/hr at 10/24/22 1510 New Bag at 10/24/22 1510   acetaminophen (TYLENOL) tablet 650 mg  650 mg Oral Q6H PRN Nadara Mustard, MD   650 mg at 10/23/22 2120   Or   acetaminophen (TYLENOL) suppository 650 mg  650 mg Rectal Q6H PRN Nadara Mustard, MD       acetaminophen (TYLENOL) tablet 325-650 mg  325-650 mg Oral Q6H PRN Nadara Mustard, MD       albuterol (PROVENTIL) (2.5 MG/3ML) 0.083% nebulizer solution 2.5 mg  2.5 mg Nebulization Q6H PRN Nadara Mustard, MD   2.5 mg at 10/25/22 0419   Ampicillin-Sulbactam (UNASYN) 3 g in sodium chloride 0.9 % 100 mL IVPB  3 g Intravenous Q6H Narda Bonds, MD 200 mL/hr at 10/26/22 0643 3 g at 10/26/22 1610   aspirin chewable tablet 81 mg  81 mg Oral Daily Narda Bonds, MD   81 mg at 10/26/22 0833   atenolol (TENORMIN) tablet 50 mg  50 mg Oral q morning Narda Bonds, MD   50 mg at 10/26/22 9604   And   atenolol (TENORMIN) tablet 25 mg  25 mg Oral QHS Narda Bonds, MD   25 mg at 10/25/22 2119   bisacodyl (DULCOLAX) suppository 10 mg  10 mg Rectal Daily PRN Nadara Mustard, MD       docusate sodium (COLACE) capsule 100 mg  100 mg Oral BID Nadara Mustard, MD   100 mg at 10/26/22 0833   enoxaparin (LOVENOX) injection 40 mg  40 mg Subcutaneous Q24H Nadara Mustard, MD   40 mg at 10/25/22 1402   gabapentin (NEURONTIN) capsule 300 mg  300 mg Oral BID Narda Bonds, MD   300 mg at 10/26/22 5409   HYDROmorphone (DILAUDID) injection 0.5-1 mg  0.5-1 mg Intravenous Q4H PRN Nadara Mustard, MD   1 mg at 10/25/22 0227   insulin aspart (novoLOG) injection 0-15 Units  0-15 Units Subcutaneous TID WC Nadara Mustard, MD   2 Units at 10/26/22  0835   insulin aspart (novoLOG) injection 0-5 Units  0-5 Units Subcutaneous QHS Nadara Mustard, MD       insulin glargine-yfgn [facility]) injection 20 Units  20 Units Subcutaneous Daily Nadara Mustard, MD   20 Units at 10/26/22 8119   leptospermum manuka honey (MEDIHONEY) paste 1 Application  1 Application Topical Daily Nadara Mustard, MD   1 Application at 10/23/22 1259   magnesium citrate solution 1 Bottle  1 Bottle Oral Once PRN Nadara Mustard, MD       methocarbamol (ROBAXIN) tablet 500 mg  500 mg Oral Q6H PRN Nadara Mustard, MD   500 mg at 10/25/22 1441   Or   methocarbamol (ROBAXIN) 500 mg in dextrose 5 % 50  mL IVPB  500 mg Intravenous Q6H PRN Nadara Mustard, MD       metoCLOPramide (REGLAN) tablet 5-10 mg  5-10 mg Oral Q8H PRN Nadara Mustard, MD       Or   metoCLOPramide (REGLAN) injection 5-10 mg  5-10 mg Intravenous Q8H PRN Nadara Mustard, MD       mineral oil-hydrophilic petrolatum (AQUAPHOR) ointment   Topical Daily Nadara Mustard, MD   Given at 10/23/22 1259   ondansetron (ZOFRAN) tablet 4 mg  4 mg Oral Q6H PRN Nadara Mustard, MD       Or   ondansetron [facility]) injection 4 mg  4 mg Intravenous Q6H PRN Nadara Mustard, MD       oxyCODONE (Oxy IR/ROXICODONE) immediate release tablet 10-15 mg  10-15 mg Oral Q4H PRN Nadara Mustard, MD   15 mg at 10/26/22 0020   oxyCODONE (Oxy IR/ROXICODONE) immediate release tablet 5-10 mg  5-10 mg Oral Q4H PRN Nadara Mustard, MD       polyethylene glycol (MIRALAX / GLYCOLAX) packet 17 g  17 g Oral Daily PRN Nadara Mustard, MD   17 g at 10/25/22 2141     Discharge Medications: Please see discharge summary for a list of discharge medications.  Relevant Imaging Results:  Relevant Lab Results:   Additional Information SS# [id number]  Verna Czech [place], [place]

## 2022-10-26 NOTE — Progress Notes (Addendum)
STROKE TEAM PROGRESS NOTE   SUBJECTIVE (INTERVAL HISTORY) Her husband is at the bedside.  Overall her condition is completely resolved.  Patient still on wound VAC bilateral lower extremity.  She stated that yesterday she had episode of total blindness bilaterally for several seconds and then had bilateral blurry vision for 30 minutes and resolved.  Currently at baseline.  No headache, no loss of consciousness, no arm or leg weakness.   OBJECTIVE Temp:  [97.7 F (36.5 C)-98.4 F (36.9 C)] 98.1 F (36.7 C) (05/12 1547) Pulse Rate:  [71-81] 81 (05/12 1547) Cardiac Rhythm: Normal sinus rhythm (05/12 0713) Resp:  [15-20] 16 (05/12 1547) BP: (84-156)/(59-73) 156/67 (05/12 1547) SpO2:  [94 %-99 %] 99 % (05/12 1547)  Recent Labs  Lab 10/25/22 1116 10/25/22 1750 10/25/22 2116 10/26/22 0625 10/26/22 1131  GLUCAP 175* 97 169* 142* 211*   Recent Labs  Lab 10/22/22 2130 10/25/22 1058  NA 136 133*  K 4.3 4.0  CL 102 99  CO2 23 25  GLUCOSE 174* 182*  BUN 8 9  CREATININE 0.87 0.92  CALCIUM 9.6 9.3   Recent Labs  Lab 10/22/22 2130  AST 19  ALT 11  ALKPHOS 90  BILITOT 0.5  PROT 8.5*  ALBUMIN 2.8*   Recent Labs  Lab 10/22/22 1754 10/25/22 1058  WBC 9.2 9.9  NEUTROABS 6.0  --   HGB 12.5 12.1  HCT 39.2 39.8  MCV 84.7 85.4  PLT 406* 413*   No results for input(s): "CKTOTAL", "CKMB", "CKMBINDEX", "TROPONINI" in the last 168 hours. No results for input(s): "LABPROT", "INR" in the last 72 hours. No results for input(s): "COLORURINE", "LABSPEC", "PHURINE", "GLUCOSEU", "HGBUR", "BILIRUBINUR", "KETONESUR", "PROTEINUR", "UROBILINOGEN", "NITRITE", "LEUKOCYTESUR" in the last 72 hours.  Invalid input(s): "APPERANCEUR"     Component Value Date/Time   CHOL 136 10/25/2022 1058   TRIG 104 10/25/2022 1058   HDL 46 10/25/2022 1058   CHOLHDL 3.0 10/25/2022 1058   VLDL 21 10/25/2022 1058   LDLCALC 69 10/25/2022 1058   Lab Results  Component Value Date   HGBA1C 8.1 (H) 10/23/2022    No results found for: "LABOPIA", "COCAINSCRNUR", "LABBENZ", "AMPHETMU", "THCU", "LABBARB"  No results for input(s): "ETH" in the last 168 hours.  I have personally reviewed the radiological images below and agree with the radiology interpretations.  MR ANKLE LEFT W WO CONTRAST  Result Date: 10/26/2022 CLINICAL DATA:  Lower extremity wounds EXAM: MRI OF THE LEFT ANKLE WITHOUT AND WITH CONTRAST TECHNIQUE: Multiplanar, multisequence MR imaging of the ankle was performed before and after the administration of intravenous contrast. CONTRAST:  10mL GADAVIST GADOBUTROL 1 MMOL/ML IV SOLN COMPARISON:  Radiograph 10/22/2022 FINDINGS: TENDONS Peroneal: Intact peroneus longus and peroneus brevis tendons. Posteromedial: Intact tibialis posterior, flexor hallucis longus and flexor digitorum longus tendons. Anterior: Intact tibialis anterior, extensor hallucis longus and extensor digitorum longus tendons. Achilles: Intact Achilles tendon with insertional enthesophyte formation. Plantar Fascia: There is thickening of the central band plantar fascia proximally without increased signal, compatible with chronic/prior plantar fasciitis. Adjacent plantar calcaneal spur. LIGAMENTS Lateral: The ATFL is thin but intact. Calcaneofibular ligament intact. Posterior talofibular ligament intact. Anterior and posterior tibiofibular ligaments intact. Medial: Deltoid ligament intact. Spring ligament intact. CARTILAGE Ankle Joint: No significant joint effusion. No osteochondral defect. 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 Subtalar Joints/Sinus Tarsi: No significant joint effusion. Normal sinus tarsi. Mild posterior subtalar osteoarthritis. Bones: Minimal subchondral marrow edema of the tibiotalar joint related to arthritis. There is no other significant marrow signal alteration. No evidence  of acute fracture. Soft Tissue: There is diffuse skin thickening with enhancement and relatively mild subcutaneous soft tissue swelling. There is  no organized fluid collection. IMPRESSION: IMPRESSION Diffuse skin thickening and enhancement with mild subcutaneous soft tissue swelling, consistent with venous insufficiency/lymphedema and/or cellulitis. No evidence of osteomyelitis or soft tissue abscess. Electronically Signed   By: Caprice Renshaw M.D.   On: 10/26/2022 09:31   MR BRAIN WO CONTRAST  Result Date: 10/26/2022 CLINICAL DATA:  Acute neurologic deficit EXAM: MRI HEAD WITHOUT CONTRAST TECHNIQUE: Multiplanar, multiecho pulse sequences of the brain and surrounding structures were obtained without intravenous contrast. COMPARISON:  06/12/2022 FINDINGS: Brain: Fading diffusion abnormality at the site of right MCA territory infarct visible on the study of 06/12/2022. No acute infarct. Hemosiderin deposition at old infarct site. There is multifocal hyperintense T2-weighted signal within the white matter. Parenchymal volume and CSF spaces are normal. The midline structures are normal. Vascular: Major flow voids are preserved. Skull and upper cervical spine: Normal calvarium and skull base. Visualized upper cervical spine and soft tissues are normal. Sinuses/Orbits:No paranasal sinus fluid levels or advanced mucosal thickening. No mastoid or middle ear effusion. Normal orbits. IMPRESSION: 1. No acute intracranial abnormality. 2. Fading diffusion abnormality at the site of right MCA territory infarct visible on the study of 06/12/2022. Electronically Signed   By: Deatra Robinson M.D.   On: 10/26/2022 01:44   CT ANGIO HEAD NECK W WO CM (CODE STROKE)  Result Date: 10/25/2022 CLINICAL DATA:  Neuro deficit, bilateral vision loss. EXAM: CT ANGIOGRAPHY HEAD AND NECK WITH AND WITHOUT CONTRAST TECHNIQUE: Multidetector CT imaging of the head and neck was performed using the standard protocol during bolus administration of intravenous contrast. Multiplanar CT image reconstructions and MIPs were obtained to evaluate the vascular anatomy. Carotid stenosis measurements  (when applicable) are obtained utilizing NASCET criteria, using the distal internal carotid diameter as the denominator. RADIATION DOSE REDUCTION: This exam was performed according to the departmental dose-optimization program which includes automated exposure control, adjustment of the mA and/or kV according to patient size and/or use of iterative reconstruction technique. CONTRAST:  75mL OMNIPAQUE IOHEXOL 350 MG/ML SOLN COMPARISON:  06/11/2022 FINDINGS: CTA NECK FINDINGS Aortic arch: Normal Right carotid system: No evidence of dissection, stenosis (50% or greater), or occlusion. Left carotid system: No evidence of dissection, stenosis (50% or greater), or occlusion. Vertebral arteries: Codominant. No evidence of dissection, stenosis (50% or greater), or occlusion. Skeleton: Unremarkable Other neck: Unremarkable Upper chest: Clear apical lungs Review of the MIP images confirms the above findings CTA HEAD FINDINGS Anterior circulation: Atheromatous calcification of the carotid siphons. Right MCA branch irregularity correlating with a chronic infarct. No emergent large vessel occlusion, proximal flow limiting stenosis, or aneurysm. Posterior circulation: Vertebrobasilar tortuosity without stenosis, branch occlusion, beading, or aneurysm. Venous sinuses: Diffusely patent Anatomic variants: None significant Review of the MIP images confirms the above findings IMPRESSION: No emergent finding. No flow limiting stenosis of major arteries in the head and neck. Electronically Signed   By: Tiburcio Pea M.D.   On: 10/25/2022 09:50   CT HEAD CODE STROKE WO CONTRAST  Result Date: 10/25/2022 CLINICAL DATA:  Code stroke.  Sudden bilateral vision loss. EXAM: CT HEAD WITHOUT CONTRAST TECHNIQUE: Contiguous axial images were obtained from the base of the skull through the vertex without intravenous contrast. RADIATION DOSE REDUCTION: This exam was performed according to the departmental dose-optimization program which includes  automated exposure control, adjustment of the mA and/or kV according to patient size and/or use of  iterative reconstruction technique. COMPARISON:  07/16/2022 FINDINGS: Brain: No evidence of acute infarction, hemorrhage, hydrocephalus, extra-axial collection or mass lesion/mass effect. Chronic right MCA branch infarct centered along the lateral right frontal lobe. Vascular: No hyperdense vessel or unexpected calcification. Skull: Normal. Negative for fracture or focal lesion. Sinuses/Orbits: No acute finding. Other: These results were communicated to Dr. Iver Nestle at 9:44 am on 10/25/2022 by text page via the [facility] messaging system. ASPECTS [facility] Stroke Program Early CT Score) Not scored with this history. IMPRESSION: No acute finding.  Remote right MCA branch infarct. Electronically Signed   By: Tiburcio Pea M.D.   On: 10/25/2022 09:44   VAS [place] ABI WITH/WO TBI  Result Date: 10/23/2022  LOWER EXTREMITY DOPPLER STUDY Patient Name:  SKYLEY WI  Date of Exam:   10/23/2022 Medical Rec #: [id number]        Accession #:    [id number] Date of Birth: March 28, 1951       Patient Gender: F Patient Age:   89 years Exam Location:  [facility] Procedure:      VAS [place] ABI WITH/WO TBI Referring Phys: Ulyess Blossom RATHORE --------------------------------------------------------------------------------  Indications: Chronic venous stasis ulcers x many years. Difficult to palpate              pulses on clinical exam due to skin changes. High Risk Factors: Hyperlipidemia, Diabetes, prior CVA.  Limitations: Technically difficult exam secondary to recently applied bandaging              to bilateral ankles/feet, patient body habitus, chronic skin              changes, and venous interference. Comparison Study: Numerous prior normal ABI studies. Most recent ABI performed                   05-27-2021 at outside facility. Most recent ABI performed in                   network 12-15-2018 was within normal limits with triphasic                    pulses bilaterally and normal ABI and TBI values. Performing Technologist: Jean Rosenthal RDMS RVT  Examination Guidelines: A complete evaluation includes at minimum, Doppler waveform signals and systolic blood pressure reading at the level of bilateral brachial, anterior tibial, and posterior tibial arteries, when vessel segments are accessible. Bilateral testing is considered an integral part of a complete examination. Photoelectric Plethysmograph (PPG) waveforms and toe systolic pressure readings are included as required and additional duplex testing as needed. Limited examinations for reoccurring indications may be performed as noted.  ABI Findings: +---------+------------------+-----+---------+---------------------------------+ Right    Rt Pressure (mmHg)IndexWaveform Comment                           +---------+------------------+-----+---------+---------------------------------+ Brachial 124                    triphasic                                  +---------+------------------+-----+---------+---------------------------------+ PTA                                      Unable to insonate due to  recently applied bandaging.       +---------+------------------+-----+---------+---------------------------------+ DP       183               1.38 triphasic                                  +---------+------------------+-----+---------+---------------------------------+ Great Toe110               0.83 Normal                                     +---------+------------------+-----+---------+---------------------------------+ +---------+------------------+-----+-----------+-------------------------------+ Left     Lt Pressure (mmHg)IndexWaveform   Comment                         +---------+------------------+-----+-----------+-------------------------------+ Brachial 133                    triphasic                                   +---------+------------------+-----+-----------+-------------------------------+ PTA                                        Unable to insonate due to                                                  recently applied bandaging.     +---------+------------------+-----+-----------+-------------------------------+ DP       167               1.26 triphasic                                  +---------+------------------+-----+-----------+-------------------------------+ Great Toe89                0.67 Near normalCircumference of great toe                                                 limits accuracy of pressures.   +---------+------------------+-----+-----------+-------------------------------+  Summary: Right: Resting right ankle-brachial index indicates noncompressible right lower extremity arteries. The right toe-brachial index is normal. Left: Resting left ankle-brachial index is within normal range. The left toe-brachial index is near normal. *See table(s) above for measurements and observations.  Electronically signed by Coral Else MD on 10/23/2022 at 9:48:57 PM.    Final    DG Foot Complete Right  Result Date: 10/22/2022 CLINICAL DATA:  Diabetic foot wound EXAM: RIGHT FOOT COMPLETE - 3+ VIEW COMPARISON:  10/15/2021 FINDINGS: Osseous demineralization. No fracture or malalignment. No definitive osseous destructive change. Small plantar calcaneal spur. Generalized soft tissue swelling. Possible ulcer dorsal aspect of the foot. Possible ulcer posterior heel. No soft tissue gas. IMPRESSION: 1. Osseous demineralization. No acute osseous abnormality. 2. Generalized soft tissue swelling. Possible ulcer dorsal aspect of the foot and posterior heel. Electronically Signed  By: Jasmine Pang M.D.   On: 10/22/2022 22:09   DG Foot Complete Left  Result Date: 10/22/2022 CLINICAL DATA:  Diabetic wound. Pain. EXAM: LEFT FOOT - COMPLETE 3+ VIEW COMPARISON:  Radiograph  10/15/2021 FINDINGS: Bony under mineralization. Hammertoe deformity of the toes. There is hallux valgus with degenerative change of the first metatarsal phalangeal joint. Erosion is noted about the medial aspect of the first metatarsal head, chronic. Plantar calcaneal spur and Achilles tendon enthesophyte. Dorsal soft tissue thickening. There is skin and soft tissue irregularity about the dorsum of the mid and forefoot. Skin irregularity posterior to the calcaneus. No evidence of soft tissue gas or radiopaque foreign body. Multiple soft tissue calcifications involving the distal lower leg. IMPRESSION: 1. Soft tissue irregularity about the dorsum of the mid and forefoot and skin irregularity posterior to the calcaneus, suspicious for soft tissue ulcer. No radiographic evidence of osteomyelitis. 2. Hallux valgus with degenerative change of the first metatarsophalangeal joint. Chronic erosion about the first metatarsal head. Electronically Signed   By: Narda Rutherford M.D.   On: 10/22/2022 22:05     PHYSICAL EXAM  Temp:  [97.7 F (36.5 C)-98.4 F (36.9 C)] 98.1 F (36.7 C) (05/12 1547) Pulse Rate:  [71-81] 81 (05/12 1547) Resp:  [15-20] 16 (05/12 1547) BP: (84-156)/(59-73) 156/67 (05/12 1547) SpO2:  [94 %-99 %] 99 % (05/12 1547)  General - obese, well developed, in no apparent distress.  Ophthalmologic - fundi not visualized due to noncooperation.  Cardiovascular - Regular rhythm and rate.  Mental Status -  Level of arousal and orientation to time, place, and person were intact. Language including expression, naming, repetition, comprehension was assessed and found intact. Fund of Knowledge was assessed and was intact.  Cranial Nerves II - XII - II - Visual field intact OU. III, IV, VI - Extraocular movements intact. V - Facial sensation intact bilaterally. VII - Facial movement intact bilaterally. VIII - Hearing & vestibular intact bilaterally. X - Palate elevates symmetrically. XI -  Chin turning & shoulder shrug intact bilaterally. XII - Tongue protrusion intact.  Motor Strength - The patient's strength was symmetrical in all extremities and pronator drift was absent.  Bilateral lower extremity wound VAC in place.  Bulk was normal and fasciculations were absent.   Motor Tone - Muscle tone was assessed at the neck and appendages and was normal.  Reflexes - The patient's reflexes were symmetrical in all extremities and she had no pathological reflexes.  Sensory - Light touch, temperature/pinprick were assessed and were symmetrical.    Coordination - The patient had normal movements in the hands with no ataxia or dysmetria.  Tremor was absent.  Gait and Station - deferred.   ASSESSMENT/PLAN [name] is a 72 y.o. female with history of hypertension, hyperlipidemia, diabetes, bipolar disorder, obesity, chronic bilateral lower extremity wounds status post wound VAC 10/24/2022, stroke consult did for brief episode of bilateral vision loss. No tPA given due to symptom resolved.    Transient binocular vision loss, etiology unclear.  DDx including complicated migraine, seizure, syncope, bilateral papilledema. CT acute abnormality CT head and neck unremarkable MRI no acute infarct, old right MCA infarct 2D Echo 06/2022 EF 65 to 70% Loop recorder interrogation pending LDL 69 HgbA1c 8.1 Lovenox for VTE prophylaxis aspirin 81 mg daily prior to admission, now on aspirin 81 mg daily.  Continue on discharge Ongoing aggressive stroke risk factor management Therapy recommendations: SNF Disposition: Pending  History of stroke 05/2022 right MCA infarct with  right M2 occlusion status post IR with TICI3.  CTP 26/45.  LE venous Doppler no DVT.  EF 65 to 70%.  LDL 45, A1c 10.5.  Loop recorder placed.  Discharged on DAPT.  Diabetes HgbA1c 8.1 goal < 7.0 Uncontrolled Currently on insulin CBG monitoring SSI DM education and close PCP follow up  Hypertension Stable Long  term BP goal normotensive  Hyperlipidemia Home meds: None LDL 69, goal < 70 Now on Crestor 10 No high intensity statin due to LDL near goal. Continue statin at discharge  Other Stroke Risk Factors Advanced age Obesity, Body mass index is 46.76 kg/m.   Other Active Problems B/l LE wound on wound vac and [facility] day # 3  Neurology will sign off. Please call with questions. Pt will follow up with Dr. Epimenio Foot at [facility] on 11/18/22. Thanks for the consult.   Marvel Plan, MD PhD Stroke Neurology 10/26/2022 4:18 PM    To contact Stroke Continuity provider, please refer to [email or web address]. After hours, contact General Neurology

## 2022-10-27 DIAGNOSIS — L97929 Non-pressure chronic ulcer of unspecified part of left lower leg with unspecified severity: Secondary | ICD-10-CM | POA: Diagnosis not present

## 2022-10-27 DIAGNOSIS — E11622 Type 2 diabetes mellitus with other skin ulcer: Secondary | ICD-10-CM | POA: Diagnosis not present

## 2022-10-27 DIAGNOSIS — L03119 Cellulitis of unspecified part of limb: Secondary | ICD-10-CM | POA: Diagnosis not present

## 2022-10-27 LAB — AEROBIC/ANAEROBIC CULTURE W GRAM STAIN (SURGICAL/DEEP WOUND)

## 2022-10-27 LAB — GLUCOSE, CAPILLARY
Glucose-Capillary: 149 mg/dL — ABNORMAL HIGH (ref 70–99)
Glucose-Capillary: 117 mg/dL — ABNORMAL HIGH (ref 70–99)
Glucose-Capillary: 170 mg/dL — ABNORMAL HIGH (ref 70–99)
Glucose-Capillary: 160 mg/dL — ABNORMAL HIGH (ref 70–99)
Glucose-Capillary: 180 mg/dL — ABNORMAL HIGH (ref 70–99)

## 2022-10-27 MED ORDER — MUPIROCIN 2 % EX OINT
1.0000 | TOPICAL_OINTMENT | Freq: Two times a day (BID) | CUTANEOUS | Status: DC
  Administered 2022-10-27 – 2022-10-29 (×5): 1 via NASAL
  Filled 2022-10-27: qty 22

## 2022-10-27 MED ORDER — CHLORHEXIDINE GLUCONATE CLOTH 2 % EX PADS
6.0000 | MEDICATED_PAD | Freq: Every day | CUTANEOUS | Status: DC
  Administered 2022-10-27 – 2022-10-29 (×3): 6 via TOPICAL

## 2022-10-27 MED ORDER — PIPERACILLIN-TAZOBACTAM 3.375 G IVPB
3.3750 g | Freq: Three times a day (TID) | INTRAVENOUS | Status: DC
  Administered 2022-10-27 – 2022-10-29 (×7): 3.375 g via INTRAVENOUS
  Filled 2022-10-27 (×7): qty 50

## 2022-10-27 NOTE — Progress Notes (Signed)
Occupational Therapy Treatment Patient Details Name: [name] MRN: [id number] DOB: [birth year] Today's Date: 10/27/2022   History of present illness Pt is 72 yo female who presents on 10/23/22.  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 On 5/11, pt with onset of AMS and visual issues; MRI brain negative. PMH: HTN, OA, gout, HLD, TIA, DM2,   OT comments  Pt making steady progress towards established OT goals w/ reports of vision/cognition being back to baseline. On entry, bed linen soaked and pt eager to get OOB. Pt required Mod A for bed mobility and min guard for pivot to recliner. Pt continues to require extensive assist for LB ADLs due to deficits though normally able to manage LB ADLs at home. Pt reports at baseline pivoting to/from power chair so close to baseline in this area.    Recommendations for follow up therapy are one component of a multi-disciplinary discharge planning process, led by the attending physician.  Recommendations may be updated based on patient status, additional functional criteria and insurance authorization.    Assistance Recommended at Discharge Frequent or constant Supervision/Assistance  Patient can return home with the following  A lot of help with bathing/dressing/bathroom;Assistance with cooking/housework;Direct supervision/assist for medications management;Direct supervision/assist for financial management;Assist for transportation;Help with stairs or ramp for entrance;A little help with walking and/or transfers   Equipment Recommendations  None recommended by OT    Recommendations for Other Services      Precautions / Restrictions Precautions Precautions: Fall Restrictions Weight Bearing Restrictions: Yes RLE Weight Bearing: Weight bearing as tolerated LLE Weight Bearing: Weight bearing as tolerated Other Position/Activity Restrictions: BLE wound vacs       Mobility Bed Mobility Overal bed mobility: Needs Assistance Bed  Mobility: Supine to Sit     Supine to sit: Mod assist, HOB elevated     General bed mobility comments: assist for BLE to EOB (pt reporting "they feel dead"), and Mod A to lift trunk and gain balance EOB    Transfers Overall transfer level: Needs assistance Equipment used: Rolling walker (2 wheels) Transfers: Sit to/from Stand, Bed to chair/wheelchair/BSC Sit to Stand: Min guard     Step pivot transfers: Min guard     General transfer comment: pushing RW away, cues for safety though no assist really needed to step to chair     Balance Overall balance assessment: Needs assistance Sitting-balance support: Single extremity supported, Feet supported Sitting balance-Leahy Scale: Fair     Standing balance support: Bilateral upper extremity supported, Single extremity supported, During functional activity Standing balance-Leahy Scale: Poor                             ADL either performed or assessed with clinical judgement   ADL Overall ADL's : Needs assistance/impaired Eating/Feeding: Independent;Sitting   Grooming: Set up;Sitting;Wash/dry face               Lower Body Dressing: Maximal assistance;Sit to/from stand Lower Body Dressing Details (indicate cue type and reason): sock mgmt               General ADL Comments: Focus on OOB transfers, assessment of vision (back to baseline now) and discussion of needs    Extremity/Trunk Assessment Upper Extremity Assessment Upper Extremity Assessment: Overall WFL for tasks assessed   Lower Extremity Assessment Lower Extremity Assessment: Defer to PT evaluation        Vision   Vision Assessment?: No apparent visual deficits  Additional Comments: back to baseline per pt   Perception     Praxis      Cognition Arousal/Alertness: Awake/alert Behavior During Therapy: WFL for tasks assessed/performed Overall Cognitive Status: Impaired/Different from baseline Area of Impairment: Attention,  Safety/judgement, Awareness, Problem solving                   Current Attention Level: Selective     Safety/Judgement: Decreased awareness of safety, Decreased awareness of deficits Awareness: Emergent Problem Solving: Requires verbal cues, Requires tactile cues, Slow processing General Comments: oriented, alert and pleasant. does show some decreased insight into need for safety precautions (attempting to stand without assist, pushing walker away, etc)        Exercises      Shoulder Instructions       General Comments      Pertinent Vitals/ Pain       Pain Assessment Pain Assessment: No/denies pain Pain Intervention(s): Monitored during session  Home Living                                          Prior Functioning/Environment              Frequency  Min 2X/week        Progress Toward Goals  OT Goals(current goals can now be found in the care plan section)  Progress towards OT goals: Progressing toward goals     Plan Discharge plan remains appropriate    Co-evaluation                 AM-PAC OT "6 Clicks" Daily Activity     Outcome Measure   Help from another person eating meals?: None Help from another person taking care of personal grooming?: A Little Help from another person toileting, which includes using toliet, bedpan, or urinal?: A Lot Help from another person bathing (including washing, rinsing, drying)?: A Lot Help from another person to put on and taking off regular upper body clothing?: A Little Help from another person to put on and taking off regular lower body clothing?: A Lot 6 Click Score: 16    End of Session Equipment Utilized During Treatment: Rolling walker (2 wheels);Gait belt  OT Visit Diagnosis: Muscle weakness (generalized) (M62.81);Low vision, both eyes (H54.2);Other symptoms and signs involving cognitive function   Activity Tolerance Patient tolerated treatment well   Patient Left in  chair;with call bell/phone within reach;with chair alarm set;Other (comment) (with PT)   Nurse Communication          Time: 6295-2841 OT Time Calculation (min): 35 min  Charges: OT General Charges $OT Visit: 1 Visit OT Treatments $Self Care/Home Management : 8-22 mins  Bradd Canary, OTR/L Acute Rehab Services Office: [phone]   Lorre Munroe 10/27/2022, 12:39 PM

## 2022-10-27 NOTE — Progress Notes (Addendum)
PROGRESS NOTE    [name]  ZOX:[id number] DOB: Feb 10, 1951 DOA: 10/22/2022 PCP: Salvatore Decent, PA-C   Brief Narrative: [name] is a 72 y.o. female with a history of hypertension, hyperlipidemia, stroke, diabetes mellitus on insulin, anxiety, depression, bipolar disorder, asthma, chronic back pain, dementia inability to use, chronic bilateral lower extremity wounds morbid obesity.  Patient presents secondary to drainage from leg wounds with concern for infection.  On admission patient was noted to have drainage from chronic leg wounds without obvious evidence of infection.  Patient received empiric antibiotics which were discontinued shortly after admission.  Orthopedic surgery consulted and took patient to the OR for debridement. On 5/10, patient developed transient vision loss with subsequent blurry vision; code stroke was called; neurology consulted. MRI negative for acute stroke.   Assessment and Plan:  Chronic bilateral lower extremity wounds No obvious evidence of infection. One ulcer appears to be amenable to debridement. Orthopedic surgery consulted. X-rays negative for osteomyelitis. Excisional debridement of ulcers performed on 5/10; wound vac placed. Wound cultures obtained and appears to have polymicrobial result, including pseudomonas pneumoniae. -Orthopedic surgery recommendations: continue wound vac; okay to start Aspirin -Switch to Zosyn IV -Follow-up wound culture   Diabetes mellitus type 2 Uncontrolled with hyperglycemia. Hemoglobin A1C of 8.1%. Patient states she takes Lantus 100 units daily divided to 80 and 20 unit doses. Blood sugar uncontrolled while admitted. -Semglee 20 units daily -Continue SSI  Transient vision loss Concern for possible stroke. Code stroke called on 5/11. CT head negative for acute stroke and CTA head/neck negative for large vessel occlusion. LDL 69. A1C of 8.1%. Recent Transthoracic Echocardiogram from 06/2022. MRI brain without  evidence of infarct. -Neurology recommendations: 81 mg daily, Crestor  Diabetic neuropathy Patient is prescribed gabapentin 600 mg twice daily but ports taking gabapentin 300 mg twice daily as an outpatient. -Continue gabapentin 300 mg twice daily  Asthma Stable. - Continue albuterol PRN  Hypertension Patient is managed on nifedipine 90 mg daily and atenolol 50 mg every morning and 25 mg nightly as an outpatient. -Continue home atenolol  History of stroke Patient is on aspirin as an outpatient which was held on admission. -aspirin  Depression Patient is prescribed trazodone as an outpatient but is listed as not taking at this time.   DVT prophylaxis: Lovenox Code Status:   Code Status: DNR Family Communication: Husband and son at bedside Disposition Plan: Discharge home likely in 1-2 days pending orthopedic surgery recommendations/management and neurology recommendations, transition to oral antibiotics   Consultants:  Orthopedic surgery Neurology  Procedures:  5/10: Excisional debridement of venous and lymphatic insufficiency ulcers bilateral lower extremities. Skin, soft tissue excised with a 21 blade knife   Antimicrobials: Vancomycin Unasyn Zosyn   Subjective: Patient without specific concerns other than hoping to find an answer for her transient vision loss. Vision is restored.  Objective: BP 122/62 (BP Location: Right Arm)   Pulse 67   Temp 98.3 F (36.8 C) (Oral)   Resp 18   Ht 5\' 5"  (1.651 m)   Wt 127.5 kg   SpO2 98%   BMI 46.76 kg/m   Examination:  General exam: Appears calm and comfortable Respiratory system: Clear to auscultation. Respiratory effort normal. Cardiovascular system: S1 & S2 heard, RRR. No murmurs. Gastrointestinal system: Abdomen is nondistended, soft and nontender. Normal bowel sounds heard. Central nervous system: Alert and oriented. No focal neurological deficits. Musculoskeletal: No calf tenderness Skin: Bilateral wound vacs  over lower extremities Psychiatry: Judgement and insight appear  normal. Mood & affect appropriate.    Data Reviewed: I have personally reviewed following labs and imaging studies  CBC Lab Results  Component Value Date   WBC 9.9 10/25/2022   RBC 4.66 10/25/2022   HGB 12.1 10/25/2022   HCT 39.8 10/25/2022   MCV 85.4 10/25/2022   MCH 26.0 10/25/2022   PLT 413 (H) 10/25/2022   MCHC 30.4 10/25/2022   RDW 15.8 (H) 10/25/2022   LYMPHSABS 2.1 10/22/2022   MONOABS 0.6 10/22/2022   EOSABS 0.3 10/22/2022   BASOSABS 0.0 10/22/2022     Last metabolic panel Lab Results  Component Value Date   NA 133 (L) 10/25/2022   K 4.0 10/25/2022   CL 99 10/25/2022   CO2 25 10/25/2022   BUN 9 10/25/2022   CREATININE 0.92 10/25/2022   GLUCOSE 182 (H) 10/25/2022   GFRNONAA >60 10/25/2022   GFRAA >60 11/18/2019   CALCIUM 9.3 10/25/2022   PHOS 3.1 06/27/2022   PROT 8.5 (H) 10/22/2022   ALBUMIN 2.8 (L) 10/22/2022   BILITOT 0.5 10/22/2022   ALKPHOS 90 10/22/2022   AST 19 10/22/2022   ALT 11 10/22/2022   ANIONGAP 9 10/25/2022    GFR: Estimated Creatinine Clearance: 73.3 mL/min (by C-G formula based on SCr of 0.92 mg/dL).  Recent Results (from the past 240 hour(s))  Surgical pcr screen     Status: Abnormal   Collection Time: 10/24/22  6:58 AM   Specimen: Nasal Mucosa; Nasal Swab  Result Value Ref Range Status   MRSA, PCR NEGATIVE NEGATIVE Final   Staphylococcus aureus POSITIVE (A) NEGATIVE Final    Comment: (NOTE) The Xpert SA Assay (FDA approved for NASAL specimens in patients 7 years of age and older), is one component of a comprehensive surveillance program. It is not intended to diagnose infection nor to guide or monitor treatment. Performed at [facility], [address] [place], [place] [address]   Aerobic/Anaerobic Culture w Gram Stain (surgical/deep wound)     Status: None (Preliminary result)   Collection Time: 10/24/22 11:19 AM   Specimen: Soft Tissue, Other   Result Value Ref Range Status   Specimen Description TISSUE  Final   Special Requests SOFT TISS RT ANKLE  Final   Gram Stain   Final    MODERATE SQUAMOUS EPITHELIAL CELLS PRESENT ABUNDANT GRAM POSITIVE COCCI ABUNDANT GRAM POSITIVE RODS FEW GRAM NEGATIVE RODS Performed at [facility], [address] [place], [place] [address]    Culture   Final    FEW PROTEUS MIRABILIS ABUNDANT STAPHYLOCOCCUS AUREUS ABUNDANT PSEUDOMONAS AERUGINOSA WITHIN MIXED CULTURE NO ANAEROBES ISOLATED; CULTURE IN PROGRESS FOR 5 DAYS    Report Status PENDING  Incomplete  Aerobic/Anaerobic Culture w Gram Stain (surgical/deep wound)     Status: None (Preliminary result)   Collection Time: 10/24/22 11:23 AM   Specimen: Soft Tissue, Other  Result Value Ref Range Status   Specimen Description TISSUE  Final   Special Requests SOFT TIS LT LEG  Final   Gram Stain   Final    ABUNDANT SQUAMOUS EPITHELIAL CELLS PRESENT FEW WBC PRESENT, PREDOMINANTLY PMN MODERATE GRAM POSITIVE RODS MODERATE GRAM POSITIVE COCCI FEW GRAM NEGATIVE RODS Performed at [facility], [address], [place], [place] [address]    Culture   Final    FEW STAPHYLOCOCCUS AUREUS ABUNDANT GRAM NEGATIVE RODS MODERATE PROTEUS MIRABILIS WITHIN MIXED CULTURE NO ANAEROBES ISOLATED; CULTURE IN PROGRESS FOR 5 DAYS    Report Status PENDING  Incomplete  Organism ID, Bacteria PROTEUS MIRABILIS  Final      Susceptibility   Proteus mirabilis - MIC*    AMPICILLIN <=2 SENSITIVE Sensitive     CEFEPIME <=0.12 SENSITIVE Sensitive     CEFTAZIDIME <=1 SENSITIVE Sensitive     CEFTRIAXONE <=0.25 SENSITIVE Sensitive     CIPROFLOXACIN <=0.25 SENSITIVE Sensitive     GENTAMICIN <=1 SENSITIVE Sensitive     IMIPENEM 4 SENSITIVE Sensitive     TRIMETH/SULFA <=20 SENSITIVE Sensitive     AMPICILLIN/SULBACTAM <=2 SENSITIVE Sensitive     PIP/TAZO <=4 SENSITIVE Sensitive     * MODERATE PROTEUS MIRABILIS      Radiology Studies: MR ANKLE LEFT W WO  CONTRAST  Result Date: 10/26/2022 CLINICAL DATA:  Lower extremity wounds EXAM: MRI OF THE LEFT ANKLE WITHOUT AND WITH CONTRAST TECHNIQUE: Multiplanar, multisequence MR imaging of the ankle was performed before and after the administration of intravenous contrast. CONTRAST:  10mL GADAVIST GADOBUTROL 1 MMOL/ML IV SOLN COMPARISON:  Radiograph 10/22/2022 FINDINGS: TENDONS Peroneal: Intact peroneus longus and peroneus brevis tendons. Posteromedial: Intact tibialis posterior, flexor hallucis longus and flexor digitorum longus tendons. Anterior: Intact tibialis anterior, extensor hallucis longus and extensor digitorum longus tendons. Achilles: Intact Achilles tendon with insertional enthesophyte formation. Plantar Fascia: There is thickening of the central band plantar fascia proximally without increased signal, compatible with chronic/prior plantar fasciitis. Adjacent plantar calcaneal spur. LIGAMENTS Lateral: The ATFL is thin but intact. Calcaneofibular ligament intact. Posterior talofibular ligament intact. Anterior and posterior tibiofibular ligaments intact. Medial: Deltoid ligament intact. Spring ligament intact. CARTILAGE Ankle Joint: No significant joint effusion. No osteochondral defect. 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 Subtalar Joints/Sinus Tarsi: No significant joint effusion. Normal sinus tarsi. Mild posterior subtalar osteoarthritis. Bones: Minimal subchondral marrow edema of the tibiotalar joint related to arthritis. There is no other significant marrow signal alteration. No evidence of acute fracture. Soft Tissue: There is diffuse skin thickening with enhancement and relatively mild subcutaneous soft tissue swelling. There is no organized fluid collection. IMPRESSION: IMPRESSION Diffuse skin thickening and enhancement with mild subcutaneous soft tissue swelling, consistent with venous insufficiency/lymphedema and/or cellulitis. No evidence of osteomyelitis or soft tissue abscess. Electronically  Signed   By: Caprice Renshaw M.D.   On: 10/26/2022 09:31   MR BRAIN WO CONTRAST  Result Date: 10/26/2022 CLINICAL DATA:  Acute neurologic deficit EXAM: MRI HEAD WITHOUT CONTRAST TECHNIQUE: Multiplanar, multiecho pulse sequences of the brain and surrounding structures were obtained without intravenous contrast. COMPARISON:  06/12/2022 FINDINGS: Brain: Fading diffusion abnormality at the site of right MCA territory infarct visible on the study of 06/12/2022. No acute infarct. Hemosiderin deposition at old infarct site. There is multifocal hyperintense T2-weighted signal within the white matter. Parenchymal volume and CSF spaces are normal. The midline structures are normal. Vascular: Major flow voids are preserved. Skull and upper cervical spine: Normal calvarium and skull base. Visualized upper cervical spine and soft tissues are normal. Sinuses/Orbits:No paranasal sinus fluid levels or advanced mucosal thickening. No mastoid or middle ear effusion. Normal orbits. IMPRESSION: 1. No acute intracranial abnormality. 2. Fading diffusion abnormality at the site of right MCA territory infarct visible on the study of 06/12/2022. Electronically Signed   By: Deatra Robinson M.D.   On: 10/26/2022 01:44   CT ANGIO HEAD NECK W WO CM (CODE STROKE)  Result Date: 10/25/2022 CLINICAL DATA:  Neuro deficit, bilateral vision loss. EXAM: CT ANGIOGRAPHY HEAD AND NECK WITH AND WITHOUT CONTRAST TECHNIQUE: Multidetector CT imaging of the head and neck was performed using the standard  protocol during bolus administration of intravenous contrast. Multiplanar CT image reconstructions and MIPs were obtained to evaluate the vascular anatomy. Carotid stenosis measurements (when applicable) are obtained utilizing NASCET criteria, using the distal internal carotid diameter as the denominator. RADIATION DOSE REDUCTION: This exam was performed according to the departmental dose-optimization program which includes automated exposure control,  adjustment of the mA and/or kV according to patient size and/or use of iterative reconstruction technique. CONTRAST:  75mL OMNIPAQUE IOHEXOL 350 MG/ML SOLN COMPARISON:  06/11/2022 FINDINGS: CTA NECK FINDINGS Aortic arch: Normal Right carotid system: No evidence of dissection, stenosis (50% or greater), or occlusion. Left carotid system: No evidence of dissection, stenosis (50% or greater), or occlusion. Vertebral arteries: Codominant. No evidence of dissection, stenosis (50% or greater), or occlusion. Skeleton: Unremarkable Other neck: Unremarkable Upper chest: Clear apical lungs Review of the MIP images confirms the above findings CTA HEAD FINDINGS Anterior circulation: Atheromatous calcification of the carotid siphons. Right MCA branch irregularity correlating with a chronic infarct. No emergent large vessel occlusion, proximal flow limiting stenosis, or aneurysm. Posterior circulation: Vertebrobasilar tortuosity without stenosis, branch occlusion, beading, or aneurysm. Venous sinuses: Diffusely patent Anatomic variants: None significant Review of the MIP images confirms the above findings IMPRESSION: No emergent finding. No flow limiting stenosis of major arteries in the head and neck. Electronically Signed   By: Tiburcio Pea M.D.   On: 10/25/2022 09:50   CT HEAD CODE STROKE WO CONTRAST  Result Date: 10/25/2022 CLINICAL DATA:  Code stroke.  Sudden bilateral vision loss. EXAM: CT HEAD WITHOUT CONTRAST TECHNIQUE: Contiguous axial images were obtained from the base of the skull through the vertex without intravenous contrast. RADIATION DOSE REDUCTION: This exam was performed according to the departmental dose-optimization program which includes automated exposure control, adjustment of the mA and/or kV according to patient size and/or use of iterative reconstruction technique. COMPARISON:  07/16/2022 FINDINGS: Brain: No evidence of acute infarction, hemorrhage, hydrocephalus, extra-axial collection or mass  lesion/mass effect. Chronic right MCA branch infarct centered along the lateral right frontal lobe. Vascular: No hyperdense vessel or unexpected calcification. Skull: Normal. Negative for fracture or focal lesion. Sinuses/Orbits: No acute finding. Other: These results were communicated to Dr. Iver Nestle at 9:44 am on 10/25/2022 by text page via the [facility] messaging system. ASPECTS [facility] Stroke Program Early CT Score) Not scored with this history. IMPRESSION: No acute finding.  Remote right MCA branch infarct. Electronically Signed   By: Tiburcio Pea M.D.   On: 10/25/2022 09:44      LOS: 4 days    Jacquelin Hawking, MD [facility] 10/27/2022, 7:19 AM   If 7PM-7AM, please contact night-coverage [email or web address]

## 2022-10-27 NOTE — Plan of Care (Signed)
  Problem: Clinical Measurements: Goal: Ability to avoid or minimize complications of infection will improve Outcome: Progressing   Problem: Skin Integrity: Goal: Skin integrity will improve Outcome: Progressing   Problem: Education: Goal: Ability to describe self-care measures that may prevent or decrease complications (Diabetes Survival Skills Education) will improve Outcome: Progressing Goal: Individualized Educational Video(s) Outcome: Progressing   Problem: Coping: Goal: Ability to adjust to condition or change in health will improve Outcome: Progressing   Problem: Fluid Volume: Goal: Ability to maintain a balanced intake and output will improve Outcome: Progressing   Problem: Health Behavior/Discharge Planning: Goal: Ability to identify and utilize available resources and services will improve Outcome: Progressing Goal: Ability to manage health-related needs will improve Outcome: Progressing   Problem: Metabolic: Goal: Ability to maintain appropriate glucose levels will improve Outcome: Progressing   Problem: Nutritional: Goal: Maintenance of adequate nutrition will improve Outcome: Progressing Goal: Progress toward achieving an optimal weight will improve Outcome: Progressing   Problem: Skin Integrity: Goal: Risk for impaired skin integrity will decrease Outcome: Progressing   Problem: Tissue Perfusion: Goal: Adequacy of tissue perfusion will improve Outcome: Progressing   Problem: Education: Goal: Knowledge of General Education information will improve Description: Including pain rating scale, medication(s)/side effects and non-pharmacologic comfort measures Outcome: Progressing   Problem: Health Behavior/Discharge Planning: Goal: Ability to manage health-related needs will improve Outcome: Progressing   Problem: Clinical Measurements: Goal: Ability to maintain clinical measurements within normal limits will improve Outcome: Progressing Goal: Will  remain free from infection Outcome: Progressing Goal: Diagnostic test results will improve Outcome: Progressing Goal: Respiratory complications will improve Outcome: Progressing Goal: Cardiovascular complication will be avoided Outcome: Progressing   Problem: Activity: Goal: Risk for activity intolerance will decrease Outcome: Progressing   Problem: Nutrition: Goal: Adequate nutrition will be maintained Outcome: Progressing   Problem: Coping: Goal: Level of anxiety will decrease Outcome: Progressing   Problem: Elimination: Goal: Will not experience complications related to bowel motility Outcome: Progressing Goal: Will not experience complications related to urinary retention Outcome: Progressing   Problem: Pain Managment: Goal: General experience of comfort will improve Outcome: Progressing   Problem: Safety: Goal: Ability to remain free from injury will improve Outcome: Progressing   Problem: Skin Integrity: Goal: Risk for impaired skin integrity will decrease Outcome: Progressing   Problem: Education: Goal: Knowledge of disease or condition will improve Outcome: Progressing Goal: Knowledge of secondary prevention will improve (MUST DOCUMENT ALL) Outcome: Progressing Goal: Knowledge of patient specific risk factors will improve Loraine Leriche N/A or DELETE if not current risk factor) Outcome: Progressing   Problem: Ischemic Stroke/TIA Tissue Perfusion: Goal: Complications of ischemic stroke/TIA will be minimized Outcome: Progressing   Problem: Coping: Goal: Will verbalize positive feelings about self Outcome: Progressing Goal: Will identify appropriate support needs Outcome: Progressing   Problem: Health Behavior/Discharge Planning: Goal: Ability to manage health-related needs will improve Outcome: Progressing Goal: Goals will be collaboratively established with patient/family Outcome: Progressing   Problem: Self-Care: Goal: Ability to participate in self-care  as condition permits will improve Outcome: Progressing Goal: Verbalization of feelings and concerns over difficulty with self-care will improve Outcome: Progressing Goal: Ability to communicate needs accurately will improve Outcome: Progressing   Problem: Nutrition: Goal: Risk of aspiration will decrease Outcome: Progressing Goal: Dietary intake will improve Outcome: Progressing

## 2022-10-27 NOTE — Care Management Important Message (Signed)
Important Message  Patient Details  Name: [name] MRN: [id number] Date of Birth: January 13, 1951   Medicare Important Message Given:  Yes     Sevan Mcbroom Stefan Church 10/27/2022, 3:11 PM

## 2022-10-27 NOTE — Progress Notes (Signed)
Occupational Therapy Evaluation - late entry    10/25/22 1000  OT Visit Information  Last OT Received On 10/25/22  Assistance Needed +2  History of Present Illness Pt is 72 yo female who presents on 10/23/22.  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 PMH: HTN, OA, gout, HLD, TIA, DM2,  Precautions  Precautions Fall  Restrictions  Weight Bearing Restrictions Yes  RLE Weight Bearing WBAT  LLE Weight Bearing WBAT  Other Position/Activity Restrictions BLE wound vacs  Home Living  Family/patient expects to be discharged to: Private residence  Living Arrangements Spouse/significant other  Available Help at Discharge Available 24 hours/day  Type of Home Apartment  Home Access Ramped entrance  Home Layout One level  Bathroom Shower/Tub Tub/shower unit  Psychiatric nurse - power;Wheelchair - manual;Transport chair;Hospital bed;Grab bars - toilet;Grab bars - tub/shower;Hand held Armed forces logistics/support/administrative officer (2 wheels)  Additional Comments transfers to power chair and back  Prior Function  Prior Level of Function  Needs assist  ADLs Comments sponge baths  Communication  Communication Expressive difficulties;No difficulties  Pain Assessment  Pain Assessment Faces  Faces Pain Scale 2  Pain Location BLE  Pain Descriptors / Indicators Sore;Tingling  Pain Intervention(s) Monitored during session;Limited activity within patient's tolerance  Cognition  Arousal/Alertness Awake/alert  Behavior During Therapy WFL for tasks assessed/performed  Overall Cognitive Status Impaired/Different from baseline  General Comments pt AxOx4 initially but stating that just before therapy enetered the room that her vision went black and now she has blurriness. Nursing immediately notified. During session pt had slowing of speech with some problem solving difficulties and had difficulty recaling the year (after she had  previously done it with ease). Pt's RN brought to room and further neuro evaluation done, then rapid response called  Upper Extremity Assessment  Upper Extremity Assessment Generalized weakness;RUE deficits/detail;LUE deficits/detail  RUE Deficits / Details strength grossly intact during session; inconsistencies in coordination during finger to nose testing as well as awkward posture of hand. RUE tremor sititng EOB  LUE Deficits / Details Slightly decr strength as compared to R; prior CVA  Lower Extremity Assessment  Lower Extremity Assessment Defer to PT evaluation  Cervical / Trunk Assessment  Cervical / Trunk Assessment Kyphotic  Vision- History  Baseline Vision/History 1 Wears glasses  Ability to See in Adequate Light 0 Adequate  Patient Visual Report Blurring of vision (reports glasses lost in ED)  Vision- Assessment  Vision Assessment? Vision impaired- to be further tested in functional context  Additional Comments Upon arrival, nurse call light on; when asked regarding call light, pt reports her vision "went back for very brief period" ptior to OT arrival, and then came back but was blurry. She was unable to read her menu to call in her breakfast. Pt without double vision during sesison; conisstent blurriness, min jumpy movement during horizintal tracking. Gaze WFL. Very slowed movement during finger to nose testing/denies double vision and able to tell therapist how many fingers she is holding up consistently.  ADL  Overall ADL's  Needs assistance/impaired  Grooming Bed level;Minimal assistance  Upper Body Bathing Minimal assistance  Lower Body Bathing Maximal assistance  Upper Body Dressing  Minimal assistance  Lower Body Dressing Maximal assistance  Toilet Transfer Details (indicate cue type and reason) deferred  General ADL Comments eval limited by code neuro symptoms. Requested PT and Nursing enter room due to pt report of symptoms.  Bed Mobility  Overal bed mobility Needs  Assistance  Bed Mobility Supine to Sit;Sit to Supine  Supine to sit Min assist;+2 for physical assistance  Sit to supine Mod assist;+2 for physical assistance  General bed mobility comments pt assisted into sitting EOB, BP increased from 145/68 in supine to 166/87. Pt continued to have blurry vision, relayed some light headedness. Returned to supine for rapid assessment. BP in supine 128/66  Transfers  General transfer comment deferred due to neuro symptoms  Balance  Overall balance assessment Needs assistance  Sitting-balance support Single extremity supported;Feet supported  Sitting balance-Leahy Scale Fair  General Comments  General comments (skin integrity, edema, etc.) HR in 70's throughout/ Pt with finger to nose coordination deficits on R when checked as well as the reported tingling RLE. BP 145/68 (93) supine; 166/87 (112) seated; 128/66 (85) return to supine  OT - End of Session  Activity Tolerance Treatment limited secondary to medical complications (Comment) (neuro symptoms and code stroke activation)  Patient left in bed;with call bell/phone within reach;with bed alarm set;with nursing/sitter in room  Nurse Communication Mobility status (RN in room for most of session due to report of visual changes on arrival)  OT Assessment  OT Recommendation/Assessment Patient needs continued OT Services  OT Visit Diagnosis Muscle weakness (generalized) (M62.81);Low vision, both eyes (H54.2);Other symptoms and signs involving cognitive function  OT Problem List Decreased strength;Decreased activity tolerance;Decreased cognition;Impaired vision/perception;Impaired balance (sitting and/or standing)  OT Plan  OT Frequency (ACUTE ONLY) Min 2X/week  OT Treatment/Interventions (ACUTE ONLY) Self-care/ADL training;Therapeutic exercise;DME and/or AE instruction;Balance training;Patient/family education;Therapeutic activities;Cognitive remediation/compensation;Visual/perceptual remediation/compensation   AM-PAC OT "6 Clicks" Daily Activity Outcome Measure (Version 2)  Help from another person eating meals? 4  Help from another person taking care of personal grooming? 3  Help from another person toileting, which includes using toliet, bedpan, or urinal? 2  Help from another person bathing (including washing, rinsing, drying)? 2  Help from another person to put on and taking off regular upper body clothing? 3  Help from another person to put on and taking off regular lower body clothing? 2  6 Click Score 16  Progressive Mobility  What is the highest level of mobility based on the progressive mobility assessment? Level 1 (Bedfast) - Unable to balance while sitting on edge of bed  Mobility Referral No  Activity Dangled on edge of bed  OT Recommendation  Follow Up Recommendations Skilled nursing-short term rehab (<3 hours/day)  Assistance recommended at discharge Frequent or constant Supervision/Assistance  Patient can return home with the following A lot of help with walking and/or transfers;Two people to help with walking and/or transfers;A lot of help with bathing/dressing/bathroom;Two people to help with bathing/dressing/bathroom;Assistance with cooking/housework;Direct supervision/assist for medications management;Direct supervision/assist for financial management;Assist for transportation;Help with stairs or ramp for entrance  Functional Status Assessent Patient has had a recent decline in their functional status and demonstrates the ability to make significant improvements in function in a reasonable and predictable amount of time.  OT Equipment Other (comment) (defer)  OT General Charges  $OT Visit 1 Visit  Written Expression  Dominant Hand Right  Evaluation completed by Jefm Petty; not populated by [facility] Donte Lenzo, OT/L   Acute OT Clinical Specialist Acute Rehabilitation Services Pager [phone] Office [phone]

## 2022-10-27 NOTE — Progress Notes (Signed)
Pharmacy Antibiotic Note  [name] is a 72 y.o. female admitted on 10/22/2022 with  bilateral LE wounds s/p debridement on 5/10 .  Pharmacy has been consulted for Zosyn dosing.  OR cultures are growing Proteus mirabilis, MSSA, and Pseudomonas aeruginosa - all are sensitive to Zosyn. She is afebrile, WBC are stable, and renal function is normal.  Plan: Zosyn 3.375 g IV q8h to be infused over 4 hours Monitor renal function, clinical progress, cultures/sensitivities F/U LOT   Height: 5\' 5"  (165.1 cm) Weight: 127.5 kg (281 lb) IBW/kg (Calculated) : 57  Temp (24hrs), Avg:98.4 F (36.9 C), Min:98 F (36.7 C), Max:98.9 F (37.2 C)  Recent Labs  Lab 10/22/22 1754 10/22/22 2120 10/22/22 2130 10/23/22 0405 10/25/22 1058  WBC 9.2  --   --   --  9.9  CREATININE  --   --  0.87  --  0.92  LATICACIDVEN  --  1.3  --  1.8  --     Estimated Creatinine Clearance: 73.3 mL/min (by C-G formula based on SCr of 0.92 mg/dL).    Allergies  Allergen Reactions   Ace Inhibitors Swelling   Latex Itching and Swelling   Tizanidine Other (See Comments)    Tremors    Ultram [Tramadol] Nausea And Vomiting   Diprivan [Propofol] Itching   Metformin And Related Other (See Comments)    Tremors  Chills    Vancomycin 5/8 >> 5/9 Zosyn 5/13 >>   5/10 L leg tissue: Proteus, MSSA, PSA (all pan sens) 5/10 R ankle tissue: same as above   Thank you for involving pharmacy in this patient's care.  Loura Back, PharmD, BCPS Clinical Pharmacist 10/27/2022 8:27 AM

## 2022-10-27 NOTE — Plan of Care (Signed)
Loop recorder interrogation showed pt has Abbott device and no afib since Jan 2024 (when it was inserted).   Marvel Plan, MD PhD Stroke Neurology 10/27/2022 3:02 PM

## 2022-10-27 NOTE — TOC Progression Note (Signed)
Transition of Care [facility]) - Progression Note    Patient Details  Name: [name] MRN: [id number] Date of Birth: 1951-02-23  [facility] CM/SW Contact  Baldemar Lenis, [place] Phone Number: 10/27/2022, 3:21 PM  Clinical Narrative:   CSW asked [facility] to review referral, they have declined to offer a bed for the patient. CSW met with patient and spouse at bedside to update, provide other bed offers. Patient and spouse to review, CSW to follow back up with them for SNF choice.    Expected Discharge Plan: Skilled Nursing Facility Barriers to Discharge: Continued Medical Work up  Expected Discharge Plan and Services In-house Referral: Clinical Social Work   Post Acute Care Choice: Skilled Nursing Facility Living arrangements for the past 2 months: Single Family Home                                       Social Determinants of Health (SDOH) Interventions SDOH Screenings   Food Insecurity: No Food Insecurity (10/23/2022)  Housing: Low Risk  (10/23/2022)  Transportation Needs: No Transportation Needs (10/23/2022)  Utilities: Not At Risk (10/23/2022)  Tobacco Use: Low Risk  (10/25/2022)    Readmission Risk Interventions     No data to display

## 2022-10-27 NOTE — Progress Notes (Signed)
Physical Therapy Treatment Patient Details Name: [name] MRN: [id number] DOB: November 17, 1950 Today's Date: 10/27/2022   History of Present Illness Pt is 72 yo female who presents on 10/23/22.  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 On 5/11, pt with onset of AMS and visual issues; MRI brain negative. PMH: HTN, OA, gout, HLD, TIA, DM2,    PT Comments    Pt improved from eval on Saturday, vision back to baseline, however, pt continues to be impulsive and limited insight into deficts/ safety. Worked on transfers from surface to surface which is what she does at home with her power chair. She relays the goal to begin taking some steps again but currently cannot achieve erect posture or maintain standing for more than 30 secs at a time. Worked on these things and LE there ex to move towards ambulation. PT will continue to follow.    Recommendations for follow up therapy are one component of a multi-disciplinary discharge planning process, led by the attending physician.  Recommendations may be updated based on patient status, additional functional criteria and insurance authorization.  Follow Up Recommendations  Can patient physically be transported by private vehicle: Yes    Assistance Recommended at Discharge Frequent or constant Supervision/Assistance  Patient can return home with the following A lot of help with walking and/or transfers;A lot of help with bathing/dressing/bathroom;Assistance with cooking/housework;Help with stairs or ramp for entrance;Assist for transportation   Equipment Recommendations  None recommended by PT    Recommendations for Other Services       Precautions / Restrictions Precautions Precautions: Fall Restrictions Weight Bearing Restrictions: Yes RLE Weight Bearing: Weight bearing as tolerated LLE Weight Bearing: Weight bearing as tolerated Other Position/Activity Restrictions: BLE wound vacs     Mobility  Bed Mobility                General bed mobility comments: pt up EOB with OT upon PT entry    Transfers Overall transfer level: Needs assistance Equipment used: Rolling walker (2 wheels) Transfers: Sit to/from Stand, Bed to chair/wheelchair/BSC Sit to Stand: Min guard   Step pivot transfers: Min guard       General transfer comment: pt performed step pivot multiple times to show how she does it at home with use of solid surfaces nearby. Also practiced with RW because pt relays that she would like to walk short distances again    Ambulation/Gait               General Gait Details: pt has been non ambulatory (transfers only) for several years due to back pain, uses power chair   Stairs             Wheelchair Mobility    Modified Rankin (Stroke Patients Only)       Balance Overall balance assessment: Needs assistance Sitting-balance support: Single extremity supported, Feet supported Sitting balance-Leahy Scale: Good     Standing balance support: Bilateral upper extremity supported, Single extremity supported, During functional activity Standing balance-Leahy Scale: Poor Standing balance comment: needs UE support               High Level Balance Comments: had pt work on increasing static standing time as she is not currently able to maintain standing or achieve erect posture to progress ambulation            Cognition Arousal/Alertness: Awake/alert Behavior During Therapy: WFL for tasks assessed/performed Overall Cognitive Status: Impaired/Different from baseline Area of Impairment: Attention, Safety/judgement, Awareness,  Problem solving                   Current Attention Level: Selective     Safety/Judgement: Decreased awareness of safety, Decreased awareness of deficits Awareness: Emergent Problem Solving: Requires verbal cues, Requires tactile cues, Slow processing General Comments: oriented, alert and pleasant. does show some decreased insight into  need for safety precautions (attempting to stand without assist, pushing walker away, etc)        Exercises General Exercises - Lower Extremity Ankle Circles/Pumps: AROM, Both, 20 reps, Seated Long Arc Quad: AROM, Both, 10 reps, Seated    General Comments General comments (skin integrity, edema, etc.): VSS      Pertinent Vitals/Pain Pain Assessment Pain Assessment: No/denies pain    Home Living                          Prior Function            PT Goals (current goals can now be found in the care plan section) Acute Rehab PT Goals Patient Stated Goal: return home PT Goal Formulation: With patient Time For Goal Achievement: 11/08/22 Potential to Achieve Goals: Good Progress towards PT goals: Progressing toward goals    Frequency    Min 3X/week      PT Plan Current plan remains appropriate    Co-evaluation PT/OT/SLP Co-Evaluation/Treatment: Yes Reason for Co-Treatment: Complexity of the patient's impairments (multi-system involvement);For patient/therapist safety PT goals addressed during session: Mobility/safety with mobility;Balance;Proper use of DME;Strengthening/ROM        AM-PAC PT "6 Clicks" Mobility   Outcome Measure  Help needed turning from your back to your side while in a flat bed without using bedrails?: A Little Help needed moving from lying on your back to sitting on the side of a flat bed without using bedrails?: A Little Help needed moving to and from a bed to a chair (including a wheelchair)?: A Lot Help needed standing up from a chair using your arms (e.g., wheelchair or bedside chair)?: Total Help needed to walk in hospital room?: Total Help needed climbing 3-5 steps with a railing? : Total 6 Click Score: 11    End of Session Equipment Utilized During Treatment: Gait belt Activity Tolerance: Patient tolerated treatment well Patient left: with call bell/phone within reach;in chair;with chair alarm set Nurse Communication:  Mobility status PT Visit Diagnosis: Dizziness and giddiness (R42);Pain;Other abnormalities of gait and mobility (R26.89)     Time: 0981-1914 PT Time Calculation (min) (ACUTE ONLY): 17 min  Charges:  $Therapeutic Activity: 8-22 mins                     Lyanne Co, PT  Acute Rehab Services Secure chat preferred Office [phone]    Lawana Chambers Bolden Hagerman 10/27/2022, 4:05 PM

## 2022-10-28 ENCOUNTER — Telehealth: Payer: Self-pay

## 2022-10-28 DIAGNOSIS — L97929 Non-pressure chronic ulcer of unspecified part of left lower leg with unspecified severity: Secondary | ICD-10-CM | POA: Diagnosis not present

## 2022-10-28 DIAGNOSIS — L03119 Cellulitis of unspecified part of limb: Secondary | ICD-10-CM | POA: Diagnosis not present

## 2022-10-28 DIAGNOSIS — E11622 Type 2 diabetes mellitus with other skin ulcer: Secondary | ICD-10-CM | POA: Diagnosis not present

## 2022-10-28 LAB — GLUCOSE, CAPILLARY
Glucose-Capillary: 171 mg/dL — ABNORMAL HIGH (ref 70–99)
Glucose-Capillary: 181 mg/dL — ABNORMAL HIGH (ref 70–99)
Glucose-Capillary: 170 mg/dL — ABNORMAL HIGH (ref 70–99)
Glucose-Capillary: 160 mg/dL — ABNORMAL HIGH (ref 70–99)

## 2022-10-28 LAB — AEROBIC/ANAEROBIC CULTURE W GRAM STAIN (SURGICAL/DEEP WOUND)

## 2022-10-28 MED ORDER — OXYCODONE-ACETAMINOPHEN 5-325 MG PO TABS: 1.0000 | ORAL_TABLET | ORAL | 0 refills | Status: DC | PRN

## 2022-10-28 NOTE — Progress Notes (Signed)
PROGRESS NOTE    [name]  ZOX:[id number] DOB: 1951/06/12 DOA: 10/22/2022 PCP: Salvatore Decent, PA-C   Brief Narrative: [name] is a 72 y.o. female with a history of hypertension, hyperlipidemia, stroke, diabetes mellitus on insulin, anxiety, depression, bipolar disorder, asthma, chronic back pain, dementia inability to use, chronic bilateral lower extremity wounds morbid obesity.  Patient presents secondary to drainage from leg wounds with concern for infection.  On admission patient was noted to have drainage from chronic leg wounds without obvious evidence of infection.  Patient received empiric antibiotics which were discontinued shortly after admission.  Orthopedic surgery consulted and took patient to the OR for debridement. On 5/10, patient developed transient vision loss with subsequent blurry vision; code stroke was called; neurology consulted. MRI negative for acute stroke.   Assessment and Plan:  Chronic bilateral lower extremity wounds No obvious evidence of infection. One ulcer appears to be amenable to debridement. Orthopedic surgery consulted. X-rays negative for osteomyelitis. Excisional debridement of ulcers performed on 5/10; wound vac placed. Wound cultures obtained and appears to have polymicrobial result, including pseudomonas pneumoniae. -Orthopedic surgery recommendations: Continue wound vac; okay to start Aspirin, follow-up in office on 5/16 vs 5/17 -Continue Zosyn IV and start Ciprofloxacin 500 mg BID on discharge   Diabetes mellitus type 2 Uncontrolled with hyperglycemia. Hemoglobin A1C of 8.1%. Patient states she takes Lantus 100 units daily divided to 80 and 20 unit doses. Blood sugar uncontrolled while admitted. -Semglee 20 units daily -Continue SSI  Transient vision loss Concern for possible stroke. Code stroke called on 5/11. CT head negative for acute stroke and CTA head/neck negative for large vessel occlusion. LDL 69. A1C of 8.1%. Recent  Transthoracic Echocardiogram from 06/2022. MRI brain without evidence of infarct. -Neurology recommendations: 81 mg daily, Crestor  Diabetic neuropathy Patient is prescribed gabapentin 600 mg twice daily but reports taking gabapentin 300 mg twice daily as an outpatient. -Continue gabapentin 300 mg twice daily  Asthma Stable. - Continue albuterol PRN  Hypertension Patient is managed on nifedipine 90 mg daily and atenolol 50 mg every morning and 25 mg nightly as an outpatient. -Continue home atenolol  History of stroke Patient is on aspirin as an outpatient which was held on admission. -aspirin  Depression Patient is prescribed trazodone as an outpatient but is listed as not taking at this time.   DVT prophylaxis: Lovenox Code Status:   Code Status: DNR Family Communication: None at bedside Disposition Plan: Discharge home likely in 1 day pending SNF bed availability   Consultants:  Orthopedic surgery Neurology  Procedures:  5/10: Excisional debridement of venous and lymphatic insufficiency ulcers bilateral lower extremities. Skin, soft tissue excised with a 21 blade knife   Antimicrobials: Vancomycin Unasyn Zosyn   Subjective: Patient without issues this morning. Feels well.  Objective: BP (!) 103/51 (BP Location: Left Arm)   Pulse 62   Temp 98 F (36.7 C) (Oral)   Resp 18   Ht 5\' 5"  (1.651 m)   Wt 127.5 kg   SpO2 91%   BMI 46.76 kg/m   Examination:  General exam: Appears calm and comfortable Respiratory system: Clear to auscultation. Respiratory effort normal. Cardiovascular system: S1 & S2 heard, RRR. Gastrointestinal system: Abdomen is nondistended, soft and nontender. Normal bowel sounds heard. Central nervous system: Alert and oriented. No focal neurological deficits. Musculoskeletal: No calf tenderness Skin: Bilateral lower extremity wound vacs noted Psychiatry: Judgement and insight appear normal. Mood & affect appropriate.    Data Reviewed: I  have personally reviewed following labs and imaging studies  CBC Lab Results  Component Value Date   WBC 9.9 10/25/2022   RBC 4.66 10/25/2022   HGB 12.1 10/25/2022   HCT 39.8 10/25/2022   MCV 85.4 10/25/2022   MCH 26.0 10/25/2022   PLT 413 (H) 10/25/2022   MCHC 30.4 10/25/2022   RDW 15.8 (H) 10/25/2022   LYMPHSABS 2.1 10/22/2022   MONOABS 0.6 10/22/2022   EOSABS 0.3 10/22/2022   BASOSABS 0.0 10/22/2022     Last metabolic panel Lab Results  Component Value Date   NA 133 (L) 10/25/2022   K 4.0 10/25/2022   CL 99 10/25/2022   CO2 25 10/25/2022   BUN 9 10/25/2022   CREATININE 0.92 10/25/2022   GLUCOSE 182 (H) 10/25/2022   GFRNONAA >60 10/25/2022   GFRAA >60 11/18/2019   CALCIUM 9.3 10/25/2022   PHOS 3.1 06/27/2022   PROT 8.5 (H) 10/22/2022   ALBUMIN 2.8 (L) 10/22/2022   BILITOT 0.5 10/22/2022   ALKPHOS 90 10/22/2022   AST 19 10/22/2022   ALT 11 10/22/2022   ANIONGAP 9 10/25/2022    GFR: Estimated Creatinine Clearance: 73.3 mL/min (by C-G formula based on SCr of 0.92 mg/dL).  Recent Results (from the past 240 hour(s))  Surgical pcr screen     Status: Abnormal   Collection Time: 10/24/22  6:58 AM   Specimen: Nasal Mucosa; Nasal Swab  Result Value Ref Range Status   MRSA, PCR NEGATIVE NEGATIVE Final   Staphylococcus aureus POSITIVE (A) NEGATIVE Final    Comment: (NOTE) The Xpert SA Assay (FDA approved for NASAL specimens in patients 59 years of age and older), is one component of a comprehensive surveillance program. It is not intended to diagnose infection nor to guide or monitor treatment. Performed at [facility], [address], [place], [place] [address]   Aerobic/Anaerobic Culture w Gram Stain (surgical/deep wound)     Status: None (Preliminary result)   Collection Time: 10/24/22 11:19 AM   Specimen: Soft Tissue, Other  Result Value Ref Range Status   Specimen Description TISSUE  Final   Special Requests SOFT TISS RT ANKLE  Final   Gram Stain    Final    MODERATE SQUAMOUS EPITHELIAL CELLS PRESENT ABUNDANT GRAM POSITIVE COCCI ABUNDANT GRAM POSITIVE RODS FEW GRAM NEGATIVE RODS Performed at [facility], [address], [place], [place] [address]    Culture   Final    FEW PROTEUS MIRABILIS ABUNDANT STAPHYLOCOCCUS AUREUS ABUNDANT PSEUDOMONAS AERUGINOSA WITHIN MIXED CULTURE SUSCEPTIBILITIES PERFORMED ON PREVIOUS CULTURE WITHIN THE LAST 5 DAYS. NO ANAEROBES ISOLATED; CULTURE IN PROGRESS FOR 5 DAYS    Report Status PENDING  Incomplete  Aerobic/Anaerobic Culture w Gram Stain (surgical/deep wound)     Status: None (Preliminary result)   Collection Time: 10/24/22 11:23 AM   Specimen: Soft Tissue, Other  Result Value Ref Range Status   Specimen Description TISSUE  Final   Special Requests SOFT TIS LT LEG  Final   Gram Stain   Final    ABUNDANT SQUAMOUS EPITHELIAL CELLS PRESENT FEW WBC PRESENT, PREDOMINANTLY PMN MODERATE GRAM POSITIVE RODS MODERATE GRAM POSITIVE COCCI FEW GRAM NEGATIVE RODS Performed at [facility], [address], [place], [place] [address]    Culture   Final    FEW STAPHYLOCOCCUS AUREUS ABUNDANT PSEUDOMONAS AERUGINOSA MODERATE PROTEUS MIRABILIS WITHIN MIXED CULTURE NO ANAEROBES ISOLATED; CULTURE IN PROGRESS FOR 5 DAYS    Report Status PENDING  Incomplete   Organism  ID, Bacteria PROTEUS MIRABILIS  Final   Organism ID, Bacteria STAPHYLOCOCCUS AUREUS  Final   Organism ID, Bacteria PSEUDOMONAS AERUGINOSA  Final      Susceptibility   Pseudomonas aeruginosa - MIC*    CEFTAZIDIME 8 SENSITIVE Sensitive     CIPROFLOXACIN 0.5 SENSITIVE Sensitive     GENTAMICIN 4 SENSITIVE Sensitive     IMIPENEM 2 SENSITIVE Sensitive     PIP/TAZO 16 SENSITIVE Sensitive     * ABUNDANT PSEUDOMONAS AERUGINOSA   Proteus mirabilis - MIC*    AMPICILLIN <=2 SENSITIVE Sensitive     CEFEPIME <=0.12 SENSITIVE Sensitive     CEFTAZIDIME <=1 SENSITIVE Sensitive     CEFTRIAXONE <=0.25 SENSITIVE Sensitive     CIPROFLOXACIN  <=0.25 SENSITIVE Sensitive     GENTAMICIN <=1 SENSITIVE Sensitive     IMIPENEM 4 SENSITIVE Sensitive     TRIMETH/SULFA <=20 SENSITIVE Sensitive     AMPICILLIN/SULBACTAM <=2 SENSITIVE Sensitive     PIP/TAZO <=4 SENSITIVE Sensitive     * MODERATE PROTEUS MIRABILIS   Staphylococcus aureus - MIC*    CIPROFLOXACIN <=0.5 SENSITIVE Sensitive     ERYTHROMYCIN <=0.25 SENSITIVE Sensitive     GENTAMICIN <=0.5 SENSITIVE Sensitive     OXACILLIN 0.5 SENSITIVE Sensitive     TETRACYCLINE <=1 SENSITIVE Sensitive     VANCOMYCIN <=0.5 SENSITIVE Sensitive     TRIMETH/SULFA <=10 SENSITIVE Sensitive     CLINDAMYCIN <=0.25 SENSITIVE Sensitive     RIFAMPIN <=0.5 SENSITIVE Sensitive     Inducible Clindamycin NEGATIVE Sensitive     LINEZOLID 2 SENSITIVE Sensitive     * FEW STAPHYLOCOCCUS AUREUS      Radiology Studies: No results found.    LOS: 5 days    Jacquelin Hawking, MD [facility] 10/28/2022, 1:05 PM   If 7PM-7AM, please contact night-coverage [email or web address]

## 2022-10-28 NOTE — Telephone Encounter (Signed)
-----   Message from Onnie Graham, [place] sent at 10/27/2022  3:43 PM EDT ----- Regarding: RE: Remote We have called this patient several times and asked her to come in to get help with monitor. I have been calling her since she got implanted. She needs to come into the office to be checked every so often.   Kem, cma ----- Message ----- From: Lenor Coffin, RN Sent: 10/27/2022  12:19 PM EDT To: Onnie Graham, CMA; Malissa Hippo, CMA Subject: Remote                                          Can someone help with this? ----- Message ----- From: Sheilah Pigeon, PA-C Sent: 10/27/2022   8:57 AM EDT To: [facility]  This pt had a loop implanted Jan Wondering why we don't have any transmissions?  Can you help figure that out?  renee

## 2022-10-28 NOTE — Telephone Encounter (Signed)
Attempted to contact patient/family several times. No other numbers on file to contact. Patient also does not have active mychart to send a message. No answer, LMTCB.   Spoke to [place], CMA who advised she has made numerous attempts to contact patient including sending 3 certified letters with no response.   Not sure what else we can do to assist with her monitor.

## 2022-10-28 NOTE — Telephone Encounter (Signed)
This pt is set to d/c from the hospital today and will need an appt next week with Erin or Dr. Lajoyce Corners can you please call and make an appt she is post op and will have on a vac so needs to be seen one week from today.   thanks

## 2022-10-28 NOTE — Telephone Encounter (Signed)
Pt is still in the hospital as of this afternoon and Dr. Lajoyce Corners will go over to see due to vac issue. Will hold this message and follow up with anticipated d/c date so that we can call for an appt.

## 2022-10-29 ENCOUNTER — Other Ambulatory Visit (HOSPITAL_COMMUNITY): Payer: Self-pay

## 2022-10-29 ENCOUNTER — Other Ambulatory Visit (HOSPITAL_BASED_OUTPATIENT_CLINIC_OR_DEPARTMENT_OTHER): Payer: Self-pay | Admitting: Orthopaedic Surgery

## 2022-10-29 DIAGNOSIS — I10 Essential (primary) hypertension: Secondary | ICD-10-CM | POA: Diagnosis not present

## 2022-10-29 DIAGNOSIS — E7800 Pure hypercholesterolemia, unspecified: Secondary | ICD-10-CM

## 2022-10-29 DIAGNOSIS — H547 Unspecified visual loss: Secondary | ICD-10-CM

## 2022-10-29 DIAGNOSIS — I87333 Chronic venous hypertension (idiopathic) with ulcer and inflammation of bilateral lower extremity: Secondary | ICD-10-CM

## 2022-10-29 DIAGNOSIS — Z8673 Personal history of transient ischemic attack (TIA), and cerebral infarction without residual deficits: Secondary | ICD-10-CM

## 2022-10-29 DIAGNOSIS — S81809A Unspecified open wound, unspecified lower leg, initial encounter: Secondary | ICD-10-CM | POA: Diagnosis not present

## 2022-10-29 DIAGNOSIS — E119 Type 2 diabetes mellitus without complications: Secondary | ICD-10-CM

## 2022-10-29 DIAGNOSIS — Z794 Long term (current) use of insulin: Secondary | ICD-10-CM

## 2022-10-29 LAB — AEROBIC/ANAEROBIC CULTURE W GRAM STAIN (SURGICAL/DEEP WOUND)

## 2022-10-29 LAB — GLUCOSE, CAPILLARY
Glucose-Capillary: 192 mg/dL — ABNORMAL HIGH (ref 70–99)
Glucose-Capillary: 162 mg/dL — ABNORMAL HIGH (ref 70–99)

## 2022-10-29 MED ORDER — POLYETHYLENE GLYCOL 3350 17 GM/SCOOP PO POWD
17.0000 g | Freq: Every day | ORAL | 0 refills | Status: DC | PRN
  Filled 2022-10-29: qty 238, 14d supply, fill #0

## 2022-10-29 MED ORDER — OXYCODONE-ACETAMINOPHEN 5-325 MG PO TABS: 1.0000 | ORAL_TABLET | ORAL | 0 refills | Status: DC | PRN

## 2022-10-29 MED ORDER — CEFADROXIL 500 MG PO CAPS
500.0000 mg | ORAL_CAPSULE | Freq: Two times a day (BID) | ORAL | 0 refills | Status: AC
  Filled 2022-10-29: qty 14, 7d supply, fill #0

## 2022-10-29 MED ORDER — MUPIROCIN 2 % EX OINT
1.0000 | TOPICAL_OINTMENT | Freq: Two times a day (BID) | CUTANEOUS | 0 refills | Status: DC
  Filled 2022-10-29: qty 22, 11d supply, fill #0

## 2022-10-29 MED ORDER — AQUAPHOR EX OINT
TOPICAL_OINTMENT | Freq: Every day | CUTANEOUS | 0 refills | Status: DC
  Filled 2022-10-29: qty 420, fill #0

## 2022-10-29 MED ORDER — ROSUVASTATIN CALCIUM 10 MG PO TABS
10.0000 mg | ORAL_TABLET | Freq: Every day | ORAL | 0 refills | Status: DC
  Filled 2022-10-29: qty 30, 30d supply, fill #0

## 2022-10-29 MED ORDER — INSULIN GLARGINE 100 UNIT/ML SOLOSTAR PEN: 30.0000 [IU] | PEN_INJECTOR | SUBCUTANEOUS | 3 refills | Status: DC

## 2022-10-29 MED ORDER — CIPROFLOXACIN HCL 500 MG PO TABS
500.0000 mg | ORAL_TABLET | Freq: Two times a day (BID) | ORAL | 0 refills | Status: AC
  Filled 2022-10-29: qty 14, 7d supply, fill #0

## 2022-10-29 MED ORDER — ONDANSETRON HCL 4 MG PO TABS
4.0000 mg | ORAL_TABLET | Freq: Four times a day (QID) | ORAL | 0 refills | Status: DC | PRN
  Filled 2022-10-29: qty 20, 5d supply, fill #0

## 2022-10-29 MED ORDER — MEDIHONEY WOUND/BURN DRESSING EX PSTE
1.0000 | PASTE | Freq: Every day | CUTANEOUS | 0 refills | Status: DC
  Filled 2022-10-29: qty 44, 44d supply, fill #0

## 2022-10-29 MED ORDER — GABAPENTIN 300 MG PO CAPS: 300.0000 mg | ORAL_CAPSULE | Freq: Two times a day (BID) | ORAL | Status: DC

## 2022-10-29 NOTE — Discharge Summary (Signed)
Physician Discharge Summary   Patient: [name] MRN: [id number] DOB: Nov 27, 1950  Admit date:     10/22/2022  Discharge date: 10/29/2022  Discharge Physician: Marguerita Merles, DO   PCP: Salvatore Decent, PA-C   Recommendations at discharge:   Follow-up with PCP within 1 to 2 weeks and repeat CBC, CMP, mag, Phos within 1 week Follow-up with Neurology in outpatient setting and patient has an appointment with Dr. Despina Arias at [facility] on 11/18/2022 Follow-up with Orthopedic surgery Dr. Lajoyce Corners within 1 week  Discharge Diagnoses: Principal Problem:   Non-healing wound of lower extremity Active Problems:   History of CVA (cerebrovascular accident)   Essential hypertension   Insulin dependent type 2 diabetes mellitus (HCC)   HLD (hyperlipidemia)   Diabetic ulcer of left lower leg (HCC)   Chronic venous hypertension (idiopathic) with ulcer and inflammation of bilateral lower extremity (HCC)  Resolved Problems:   * No resolved hospital problems. *  Hospital Course: [name] is a 72 y.o. female with a history of hypertension, hyperlipidemia, stroke, diabetes mellitus on insulin, anxiety, depression, bipolar disorder, asthma, chronic back pain, dementia inability to use, chronic bilateral lower extremity wounds morbid obesity.  Patient presents secondary to drainage from leg wounds with concern for infection.  On admission patient was noted to have drainage from chronic leg wounds without obvious evidence of infection.  Patient received empiric antibiotics which were discontinued shortly after admission.  Orthopedic surgery consulted and took patient to the OR for debridement. On 5/10, patient developed transient vision loss with subsequent blurry vision; code stroke was called; neurology consulted. MRI negative for acute stroke.  Neurology recommended continuing aspirin and Crestor for discharge and following up in outpatient setting given that her MRI is negative.  Unfortunately patient was  denied for SNF and will be going home and will change her oral antibiotics to cefadroxil and ciprofloxacin for 7 total days.  She is medically stable for discharge at this time and will need to follow-up with PCP, neurology, orthopedic surgery and cardiology in outpatient setting  Assessment and Plan:  Chronic bilateral lower extremity wounds -No obvious evidence of infection. One ulcer appears to be amenable to debridement. Orthopedic surgery consulted. X-rays negative for osteomyelitis.  -Excisional debridement of ulcers performed on 5/10; wound vac placed. Wound cultures obtained and appears to have polymicrobial result, including pseudomonas pneumoniae. -Orthopedic surgery recommendations:  -Continue wound vac; okay to start Aspirin, follow-up in office on 5/16 vs 5/17 -Continue Zosyn IV and start Ciprofloxacin 500 mg BID on discharge as well as p.o. cefadroxil   Diabetes mellitus type 2 -Uncontrolled with hyperglycemia. Hemoglobin A1C of 8.1%. Patient states she takes Lantus 100 units daily divided to 80 and 20 unit doses. Blood sugar uncontrolled while admitted. -Semglee 20 units daily and resume 30 units at discharge -Continue SSI -CBG Trend:  Recent Labs  Lab 10/27/22 2115 10/28/22 0618 10/28/22 1313 10/28/22 1611 10/28/22 2115 10/29/22 0618 10/29/22 1129  GLUCAP 180* 171* 181* 170* 160* 192* 162*     Transient vision loss Concern for possible stroke. Code stroke called on 5/11. CT head negative for acute stroke and CTA head/neck negative for large vessel occlusion. LDL 69. A1C of 8.1%. Recent Transthoracic Echocardiogram from 06/2022. MRI brain without evidence of infarct. -Recent echo done in January 2024 but not repeated -Loop recorder was interrogated and patient has an Abbott device with no A-fib since January 2024 when it was inserted -Neurology recommendations: 81 mg daily, Crestor and outpatient follow-up  Diabetic neuropathy -Patient is prescribed gabapentin 600 mg  twice daily but reports taking gabapentin 300 mg twice daily as an outpatient. -Continue gabapentin 300 mg twice daily   Asthma Stable. - Continue albuterol PRN   Hypertension -Patient is managed on nifedipine 90 mg daily and atenolol 50 mg every morning and 25 mg nightly as an outpatient. -Continue home atenolol and follow-up with PCP within 1 week   History of stroke Patient is on aspirin as an outpatient which was held on admission. -aspirin will be continued; was on dual antiplatelet therapy previously and has a loop recorder placed -Her stroke was thought to be a right MCA infarct with a right M2 occlusion status post IR with TICI3   Depression -Patient is prescribed trazodone as an outpatient but is listed as not taking at this time.   Morbid Obesity -Complicates overall prognosis and care -Estimated body mass index is 46.76 kg/m as calculated from the following:   Height as of this encounter: 5\' 5"  (1.651 m).   Weight as of this encounter: 127.5 kg.  -Weight Loss and Dietary Counseling given  Consultants: Neurology, orthopedic surgery Procedures performed: As delineated above Disposition: Home health that she is denied for SNF Diet recommendation:  Discharge Diet Orders (From admission, onward)     Start     Ordered   10/29/22 0000  Diet - low sodium heart healthy        10/29/22 1400   10/29/22 0000  Diet Carb Modified        10/29/22 1400           Cardiac and Carb modified diet DISCHARGE MEDICATION: Allergies as of 10/29/2022       Reactions   Ace Inhibitors Swelling   Latex Itching, Swelling   Tizanidine Other (See Comments)   Tremors    Ultram [tramadol] Nausea And Vomiting   Diprivan [propofol] Itching   Metformin And Related Other (See Comments)   Tremors  Chills        Medication List     STOP taking these medications    traZODone 50 MG tablet Commonly known as: DESYREL       TAKE these medications    acetaminophen 325 MG  tablet Commonly known as: TYLENOL Take 2 tablets (650 mg total) by mouth every 6 (six) hours as needed for mild pain (or Fever >/= 101).   aspirin EC 81 MG tablet Take 81 mg by mouth daily. Swallow whole.   atenolol 25 MG tablet Commonly known as: TENORMIN Take 25-50 mg by mouth See admin instructions. 50 mg every morning, 25 mg at bedtime   cefadroxil 500 MG capsule Commonly known as: DURICEF Take 1 capsule (500 mg total) by mouth 2 (two) times daily for 7 days.   ciprofloxacin 500 MG tablet Commonly known as: Cipro Take 1 tablet (500 mg total) by mouth 2 (two) times daily for 7 days.   cyclobenzaprine 5 MG tablet Commonly known as: FLEXERIL Take 5 mg by mouth 3 (three) times daily as needed for muscle spasms.   donepezil 10 MG tablet Commonly known as: ARICEPT Take 10 mg by mouth in the morning.   gabapentin 300 MG capsule Commonly known as: NEURONTIN Take 1 capsule (300 mg total) by mouth 2 (two) times daily.   insulin glargine 100 UNIT/ML Solostar Pen Commonly known as: LANTUS Inject 30 Units into the skin every morning. And pen needles 1/day What changed: how much to take   leptospermum manuka honey Pste paste  Apply 1 Application topically daily. Start taking on: Oct 30, 2022   lurasidone 40 MG Tabs tablet Commonly known as: Latuda Take 1 tablet (40 mg total) by mouth daily with breakfast.   mineral oil-hydrophilic petrolatum ointment Apply topically daily. Start taking on: Oct 30, 2022   mupirocin ointment 2 % Commonly known as: BACTROBAN Place 1 Application into the nose 2 (two) times daily.   NIFEdipine 90 MG 24 hr tablet Commonly known as: PROCARDIA XL/NIFEDICAL-XL Take 90 mg by mouth in the morning.   ondansetron 4 MG tablet Commonly known as: ZOFRAN Take 1 tablet (4 mg total) by mouth every 6 (six) hours. What changed: Another medication with the same name was added. Make sure you understand how and when to take each.   ondansetron 4 MG  tablet Commonly known as: ZOFRAN Take 1 tablet (4 mg total) by mouth every 6 (six) hours as needed for nausea. What changed: You were already taking a medication with the same name, and this prescription was added. Make sure you understand how and when to take each.   polyethylene glycol powder 17 GM/SCOOP powder Commonly known as: GLYCOLAX/MIRALAX Take 17 g by mouth daily as needed for mild constipation.   rosuvastatin 10 MG tablet Commonly known as: CRESTOR Take 1 tablet (10 mg total) by mouth daily. Start taking on: Oct 30, 2022               Durable Medical Equipment  (From admission, onward)           Start     Ordered   10/29/22 1207  For home use only DME Bedside commode  Once       Comments: Bariatric size due to body habitus  Question:  Patient needs a bedside commode to treat with the following condition  Answer:  Weakness   10/29/22 1206              Discharge Care Instructions  (From admission, onward)           Start     Ordered   10/29/22 0000  Discharge wound care:       Comments: Daily      Comments: Clean bilateral lower legs with soap and water, dry thoroughly.  Apply Medihoney to L medial lower leg (draining wound) and dry gauze.  Apply Aquaphor to bilateral legs and feet (DO NOT PLACE IN BETWEEN TOES), cover with ABD pad to L medial wound then wrap bilateral legs with Kerlix roll gauze beginning just above toes and ending right below knees.  May secure with Ace bandage in same fashion as Kerlix.   10/29/22 1400            Contact information for follow-up providers     Nadara Mustard, MD Follow up in 1 week(s).   Specialty: Orthopedic Surgery Contact information: [address] [place] [address] [phone]         Asa Lente, MD. Go on 11/18/2022.   Specialty: Neurology Contact information: [address] [place] [address] [phone]              Contact information for after-discharge care      Destination     [facility] Preferred SNF .   Service: Skilled Nursing Contact information: [address] [place] [address] [phone]                    Discharge Exam: Ceasar Mons Weights  10/24/22 1010  Weight: 127.5 kg   Vitals:   10/29/22 0731 10/29/22 1130  BP: 134/69 125/63  Pulse: (!) 59 65  Resp: 18 18  Temp: 97.8 F (36.6 C) 98.1 F (36.7 C)  SpO2: 98% 97%   Examination: Physical Exam:  Constitutional: WN/WD obese African-American female in no acute distress appears calm Respiratory: Diminished to auscultation bilaterally, no wheezing, rales, rhonchi or crackles. Normal respiratory effort and patient is not tachypenic. No accessory muscle use.  Unlabored breathing Cardiovascular: RRR, no murmurs / rubs / gallops. S1 and S2 auscultated.  Mild 1+ extremity edema Abdomen: Soft, non-tender, non-distended. No masses palpated. No appreciable hepatosplenomegaly. Bowel sounds positive.  GU: Deferred. Musculoskeletal: No clubbing / cyanosis of digits/nails.  Bilateral lower extremities are wrapped.  Skin: No rashes, lesions, ulcers limited skin evaluation. No induration; Warm and dry.  Neurologic: CN 2-12 grossly intact with no focal deficits. Romberg sign and cerebellar reflexes not assessed.  Psychiatric: Normal judgment and insight. Alert and oriented x 3. Normal mood and appropriate affect.   Condition at discharge: stable  The results of significant diagnostics from this hospitalization (including imaging, microbiology, ancillary and laboratory) are listed below for reference.   Imaging Studies: MR ANKLE LEFT W WO CONTRAST  Result Date: 10/26/2022 CLINICAL DATA:  Lower extremity wounds EXAM: MRI OF THE LEFT ANKLE WITHOUT AND WITH CONTRAST TECHNIQUE: Multiplanar, multisequence MR imaging of the ankle was performed before and after the administration of intravenous contrast. CONTRAST:  10mL GADAVIST GADOBUTROL 1  MMOL/ML IV SOLN COMPARISON:  Radiograph 10/22/2022 FINDINGS: TENDONS Peroneal: Intact peroneus longus and peroneus brevis tendons. Posteromedial: Intact tibialis posterior, flexor hallucis longus and flexor digitorum longus tendons. Anterior: Intact tibialis anterior, extensor hallucis longus and extensor digitorum longus tendons. Achilles: Intact Achilles tendon with insertional enthesophyte formation. Plantar Fascia: There is thickening of the central band plantar fascia proximally without increased signal, compatible with chronic/prior plantar fasciitis. Adjacent plantar calcaneal spur. LIGAMENTS Lateral: The ATFL is thin but intact. Calcaneofibular ligament intact. Posterior talofibular ligament intact. Anterior and posterior tibiofibular ligaments intact. Medial: Deltoid ligament intact. Spring ligament intact. CARTILAGE Ankle Joint: No significant joint effusion. No osteochondral defect. 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 Subtalar Joints/Sinus Tarsi: No significant joint effusion. Normal sinus tarsi. Mild posterior subtalar osteoarthritis. Bones: Minimal subchondral marrow edema of the tibiotalar joint related to arthritis. There is no other significant marrow signal alteration. No evidence of acute fracture. Soft Tissue: There is diffuse skin thickening with enhancement and relatively mild subcutaneous soft tissue swelling. There is no organized fluid collection. IMPRESSION: IMPRESSION Diffuse skin thickening and enhancement with mild subcutaneous soft tissue swelling, consistent with venous insufficiency/lymphedema and/or cellulitis. No evidence of osteomyelitis or soft tissue abscess. Electronically Signed   By: Caprice Renshaw M.D.   On: 10/26/2022 09:31   MR BRAIN WO CONTRAST  Result Date: 10/26/2022 CLINICAL DATA:  Acute neurologic deficit EXAM: MRI HEAD WITHOUT CONTRAST TECHNIQUE: Multiplanar, multiecho pulse sequences of the brain and surrounding structures were obtained without intravenous  contrast. COMPARISON:  06/12/2022 FINDINGS: Brain: Fading diffusion abnormality at the site of right MCA territory infarct visible on the study of 06/12/2022. No acute infarct. Hemosiderin deposition at old infarct site. There is multifocal hyperintense T2-weighted signal within the white matter. Parenchymal volume and CSF spaces are normal. The midline structures are normal. Vascular: Major flow voids are preserved. Skull and upper cervical spine: Normal calvarium and skull base. Visualized upper cervical spine and soft tissues are normal. Sinuses/Orbits:No paranasal sinus fluid levels or advanced  mucosal thickening. No mastoid or middle ear effusion. Normal orbits. IMPRESSION: 1. No acute intracranial abnormality. 2. Fading diffusion abnormality at the site of right MCA territory infarct visible on the study of 06/12/2022. Electronically Signed   By: Deatra Robinson M.D.   On: 10/26/2022 01:44   CT ANGIO HEAD NECK W WO CM (CODE STROKE)  Result Date: 10/25/2022 CLINICAL DATA:  Neuro deficit, bilateral vision loss. EXAM: CT ANGIOGRAPHY HEAD AND NECK WITH AND WITHOUT CONTRAST TECHNIQUE: Multidetector CT imaging of the head and neck was performed using the standard protocol during bolus administration of intravenous contrast. Multiplanar CT image reconstructions and MIPs were obtained to evaluate the vascular anatomy. Carotid stenosis measurements (when applicable) are obtained utilizing NASCET criteria, using the distal internal carotid diameter as the denominator. RADIATION DOSE REDUCTION: This exam was performed according to the departmental dose-optimization program which includes automated exposure control, adjustment of the mA and/or kV according to patient size and/or use of iterative reconstruction technique. CONTRAST:  75mL OMNIPAQUE IOHEXOL 350 MG/ML SOLN COMPARISON:  06/11/2022 FINDINGS: CTA NECK FINDINGS Aortic arch: Normal Right carotid system: No evidence of dissection, stenosis (50% or greater), or  occlusion. Left carotid system: No evidence of dissection, stenosis (50% or greater), or occlusion. Vertebral arteries: Codominant. No evidence of dissection, stenosis (50% or greater), or occlusion. Skeleton: Unremarkable Other neck: Unremarkable Upper chest: Clear apical lungs Review of the MIP images confirms the above findings CTA HEAD FINDINGS Anterior circulation: Atheromatous calcification of the carotid siphons. Right MCA branch irregularity correlating with a chronic infarct. No emergent large vessel occlusion, proximal flow limiting stenosis, or aneurysm. Posterior circulation: Vertebrobasilar tortuosity without stenosis, branch occlusion, beading, or aneurysm. Venous sinuses: Diffusely patent Anatomic variants: None significant Review of the MIP images confirms the above findings IMPRESSION: No emergent finding. No flow limiting stenosis of major arteries in the head and neck. Electronically Signed   By: Tiburcio Pea M.D.   On: 10/25/2022 09:50   CT HEAD CODE STROKE WO CONTRAST  Result Date: 10/25/2022 CLINICAL DATA:  Code stroke.  Sudden bilateral vision loss. EXAM: CT HEAD WITHOUT CONTRAST TECHNIQUE: Contiguous axial images were obtained from the base of the skull through the vertex without intravenous contrast. RADIATION DOSE REDUCTION: This exam was performed according to the departmental dose-optimization program which includes automated exposure control, adjustment of the mA and/or kV according to patient size and/or use of iterative reconstruction technique. COMPARISON:  07/16/2022 FINDINGS: Brain: No evidence of acute infarction, hemorrhage, hydrocephalus, extra-axial collection or mass lesion/mass effect. Chronic right MCA branch infarct centered along the lateral right frontal lobe. Vascular: No hyperdense vessel or unexpected calcification. Skull: Normal. Negative for fracture or focal lesion. Sinuses/Orbits: No acute finding. Other: These results were communicated to Dr. Iver Nestle at 9:44  am on 10/25/2022 by text page via the [facility] messaging system. ASPECTS [facility] Stroke Program Early CT Score) Not scored with this history. IMPRESSION: No acute finding.  Remote right MCA branch infarct. Electronically Signed   By: Tiburcio Pea M.D.   On: 10/25/2022 09:44   VAS [place] ABI WITH/WO TBI  Result Date: 10/23/2022  LOWER EXTREMITY DOPPLER STUDY Patient Name:  ROZIE RYKOWSKI  Date of Exam:   10/23/2022 Medical Rec #: [id number]        Accession #:    [id number] Date of Birth: 24-May-1951       Patient Gender: F Patient Age:   72 years Exam Location:  [facility] Procedure:      VAS [place] ABI  WITH/WO TBI Referring Phys: VASUNDHRA RATHORE --------------------------------------------------------------------------------  Indications: Chronic venous stasis ulcers x many years. Difficult to palpate              pulses on clinical exam due to skin changes. High Risk Factors: Hyperlipidemia, Diabetes, prior CVA.  Limitations: Technically difficult exam secondary to recently applied bandaging              to bilateral ankles/feet, patient body habitus, chronic skin              changes, and venous interference. Comparison Study: Numerous prior normal ABI studies. Most recent ABI performed                   05-27-2021 at outside facility. Most recent ABI performed in                   network 12-15-2018 was within normal limits with triphasic                   pulses bilaterally and normal ABI and TBI values. Performing Technologist: Jean Rosenthal RDMS RVT  Examination Guidelines: A complete evaluation includes at minimum, Doppler waveform signals and systolic blood pressure reading at the level of bilateral brachial, anterior tibial, and posterior tibial arteries, when vessel segments are accessible. Bilateral testing is considered an integral part of a complete examination. Photoelectric Plethysmograph (PPG) waveforms and toe systolic pressure readings are included as required and additional duplex testing as  needed. Limited examinations for reoccurring indications may be performed as noted.  ABI Findings: +---------+------------------+-----+---------+---------------------------------+ Right    Rt Pressure (mmHg)IndexWaveform Comment                           +---------+------------------+-----+---------+---------------------------------+ Brachial 124                    triphasic                                  +---------+------------------+-----+---------+---------------------------------+ PTA                                      Unable to insonate due to                                                  recently applied bandaging.       +---------+------------------+-----+---------+---------------------------------+ DP       183               1.38 triphasic                                  +---------+------------------+-----+---------+---------------------------------+ Great Toe110               0.83 Normal                                     +---------+------------------+-----+---------+---------------------------------+ +---------+------------------+-----+-----------+-------------------------------+ Left     Lt Pressure (mmHg)IndexWaveform   Comment                         +---------+------------------+-----+-----------+-------------------------------+  Brachial 133                    triphasic                                  +---------+------------------+-----+-----------+-------------------------------+ PTA                                        Unable to insonate due to                                                  recently applied bandaging.     +---------+------------------+-----+-----------+-------------------------------+ DP       167               1.26 triphasic                                  +---------+------------------+-----+-----------+-------------------------------+ Great Toe89                0.67 Near normalCircumference of  great toe                                                 limits accuracy of pressures.   +---------+------------------+-----+-----------+-------------------------------+  Summary: Right: Resting right ankle-brachial index indicates noncompressible right lower extremity arteries. The right toe-brachial index is normal. Left: Resting left ankle-brachial index is within normal range. The left toe-brachial index is near normal. *See table(s) above for measurements and observations.  Electronically signed by Coral Else MD on 10/23/2022 at 9:48:57 PM.    Final    DG Foot Complete Right  Result Date: 10/22/2022 CLINICAL DATA:  Diabetic foot wound EXAM: RIGHT FOOT COMPLETE - 3+ VIEW COMPARISON:  10/15/2021 FINDINGS: Osseous demineralization. No fracture or malalignment. No definitive osseous destructive change. Small plantar calcaneal spur. Generalized soft tissue swelling. Possible ulcer dorsal aspect of the foot. Possible ulcer posterior heel. No soft tissue gas. IMPRESSION: 1. Osseous demineralization. No acute osseous abnormality. 2. Generalized soft tissue swelling. Possible ulcer dorsal aspect of the foot and posterior heel. Electronically Signed   By: Jasmine Pang M.D.   On: 10/22/2022 22:09   DG Foot Complete Left  Result Date: 10/22/2022 CLINICAL DATA:  Diabetic wound. Pain. EXAM: LEFT FOOT - COMPLETE 3+ VIEW COMPARISON:  Radiograph 10/15/2021 FINDINGS: Bony under mineralization. Hammertoe deformity of the toes. There is hallux valgus with degenerative change of the first metatarsal phalangeal joint. Erosion is noted about the medial aspect of the first metatarsal head, chronic. Plantar calcaneal spur and Achilles tendon enthesophyte. Dorsal soft tissue thickening. There is skin and soft tissue irregularity about the dorsum of the mid and forefoot. Skin irregularity posterior to the calcaneus. No evidence of soft tissue gas or radiopaque foreign body. Multiple soft tissue calcifications involving  the distal lower leg. IMPRESSION: 1. Soft tissue irregularity about the dorsum of the mid and forefoot and skin irregularity posterior to the calcaneus, suspicious for soft tissue ulcer. No radiographic evidence of osteomyelitis. 2. Hallux valgus with  degenerative change of the first metatarsophalangeal joint. Chronic erosion about the first metatarsal head. Electronically Signed   By: Narda Rutherford M.D.   On: 10/22/2022 22:05    Microbiology: Results for orders placed or performed during the hospital encounter of 10/22/22  Surgical pcr screen     Status: Abnormal   Collection Time: 10/24/22  6:58 AM   Specimen: Nasal Mucosa; Nasal Swab  Result Value Ref Range Status   MRSA, PCR NEGATIVE NEGATIVE Final   Staphylococcus aureus POSITIVE (A) NEGATIVE Final    Comment: (NOTE) The Xpert SA Assay (FDA approved for NASAL specimens in patients 30 years of age and older), is one component of a comprehensive surveillance program. It is not intended to diagnose infection nor to guide or monitor treatment. Performed at [facility], [address] [place], [place] [address]   Aerobic/Anaerobic Culture w Gram Stain (surgical/deep wound)     Status: None   Collection Time: 10/24/22 11:19 AM   Specimen: Soft Tissue, Other  Result Value Ref Range Status   Specimen Description TISSUE  Final   Special Requests SOFT TISS RT ANKLE  Final   Gram Stain   Final    MODERATE SQUAMOUS EPITHELIAL CELLS PRESENT ABUNDANT GRAM POSITIVE COCCI ABUNDANT GRAM POSITIVE RODS FEW GRAM NEGATIVE RODS    Culture   Final    FEW PROTEUS MIRABILIS ABUNDANT STAPHYLOCOCCUS AUREUS ABUNDANT PSEUDOMONAS AERUGINOSA WITHIN MIXED CULTURE SUSCEPTIBILITIES PERFORMED ON PREVIOUS CULTURE WITHIN THE LAST 5 DAYS. NO ANAEROBES ISOLATED Performed at [facility], [address] [place], [place] [address]    Report Status 10/29/2022 FINAL  Final  Aerobic/Anaerobic Culture w Gram Stain (surgical/deep wound)     Status:  None   Collection Time: 10/24/22 11:23 AM   Specimen: Soft Tissue, Other  Result Value Ref Range Status   Specimen Description TISSUE  Final   Special Requests SOFT TIS LT LEG  Final   Gram Stain   Final    ABUNDANT SQUAMOUS EPITHELIAL CELLS PRESENT FEW WBC PRESENT, PREDOMINANTLY PMN MODERATE GRAM POSITIVE RODS MODERATE GRAM POSITIVE COCCI FEW GRAM NEGATIVE RODS    Culture   Final    FEW STAPHYLOCOCCUS AUREUS ABUNDANT PSEUDOMONAS AERUGINOSA MODERATE PROTEUS MIRABILIS WITHIN MIXED CULTURE NO ANAEROBES ISOLATED Performed at [facility], [address] [place], [place] [address]    Report Status 10/29/2022 FINAL  Final   Organism ID, Bacteria PROTEUS MIRABILIS  Final   Organism ID, Bacteria STAPHYLOCOCCUS AUREUS  Final   Organism ID, Bacteria PSEUDOMONAS AERUGINOSA  Final      Susceptibility   Pseudomonas aeruginosa - MIC*    CEFTAZIDIME 8 SENSITIVE Sensitive     CIPROFLOXACIN 0.5 SENSITIVE Sensitive     GENTAMICIN 4 SENSITIVE Sensitive     IMIPENEM 2 SENSITIVE Sensitive     PIP/TAZO 16 SENSITIVE Sensitive     * ABUNDANT PSEUDOMONAS AERUGINOSA   Proteus mirabilis - MIC*    AMPICILLIN <=2 SENSITIVE Sensitive     CEFEPIME <=0.12 SENSITIVE Sensitive     CEFTAZIDIME <=1 SENSITIVE Sensitive     CEFTRIAXONE <=0.25 SENSITIVE Sensitive     CIPROFLOXACIN <=0.25 SENSITIVE Sensitive     GENTAMICIN <=1 SENSITIVE Sensitive     IMIPENEM 4 SENSITIVE Sensitive     TRIMETH/SULFA <=20 SENSITIVE Sensitive     AMPICILLIN/SULBACTAM <=2 SENSITIVE Sensitive     PIP/TAZO <=4 SENSITIVE Sensitive     * MODERATE PROTEUS MIRABILIS   Staphylococcus aureus - MIC*    CIPROFLOXACIN <=  0.5 SENSITIVE Sensitive     ERYTHROMYCIN <=0.25 SENSITIVE Sensitive     GENTAMICIN <=0.5 SENSITIVE Sensitive     OXACILLIN 0.5 SENSITIVE Sensitive     TETRACYCLINE <=1 SENSITIVE Sensitive     VANCOMYCIN <=0.5 SENSITIVE Sensitive     TRIMETH/SULFA <=10 SENSITIVE Sensitive     CLINDAMYCIN <=0.25 SENSITIVE  Sensitive     RIFAMPIN <=0.5 SENSITIVE Sensitive     Inducible Clindamycin NEGATIVE Sensitive     LINEZOLID 2 SENSITIVE Sensitive     * FEW STAPHYLOCOCCUS AUREUS    Labs: CBC: Recent Labs  Lab 10/25/22 1058  WBC 9.9  HGB 12.1  HCT 39.8  MCV 85.4  PLT 413*   Basic Metabolic Panel: Recent Labs  Lab 10/22/22 2130 10/25/22 1058  NA 136 133*  K 4.3 4.0  CL 102 99  CO2 23 25  GLUCOSE 174* 182*  BUN 8 9  CREATININE 0.87 0.92  CALCIUM 9.6 9.3   Liver Function Tests: Recent Labs  Lab 10/22/22 2130  AST 19  ALT 11  ALKPHOS 90  BILITOT 0.5  PROT 8.5*  ALBUMIN 2.8*   CBG: Recent Labs  Lab 10/28/22 1313 10/28/22 1611 10/28/22 2115 10/29/22 0618 10/29/22 1129  GLUCAP 181* 170* 160* 192* 162*    Discharge time spent: greater than 30 minutes.  Signed: Marguerita Merles, DO [facility] 10/29/2022

## 2022-10-29 NOTE — TOC Transition Note (Signed)
Transition of Care [facility]) - CM/SW Discharge Note   Patient Details  Name: [name] MRN: [id number] Date of Birth: [birth year]  [facility]) CM/SW Contact:  Kermit Balo, RN Phone Number: 10/29/2022, 12:12 PM   Clinical Narrative:    Pt was denied for SNF rehab per [facility]. She has decided to d/c home with resumption of home health services through [facility]. Cheryl with [facility] is aware of dc home and resumption orders.  Pt asking for bedside commode for home. CM has sent the orders to [employer] and they will deliver the DME to the pts room.  Spouse is at the bedside. He assists with her medications at home and provides needed assistance and transportation. He is transporting her home today.   Final next level of care: Home w Home Health Services Barriers to Discharge: No Barriers Identified   Patient Goals and CMS Choice CMS Medicare.gov Compare Post Acute Care list provided to:: Patient Choice offered to / list presented to : Patient, Spouse  Discharge Placement                         Discharge Plan and Services Additional resources added to the After Visit Summary for   In-house Referral: Clinical Social Work   Post Acute Care Choice: Skilled Nursing Facility          DME Arranged: Bedside commode DME Agency: [employer] Date DME Agency Contacted: 10/29/22   Representative spoke with at DME Agency: Barbara Cower HH Arranged: RN, PT, OT [facility] Agency: [facility] Date [facility] Agency Contacted: 10/29/22   Representative spoke with at [facility] Agency: Elnita Maxwell  Social Determinants of Health (SDOH) Interventions SDOH Screenings   Food Insecurity: No Food Insecurity (10/23/2022)  Housing: Low Risk  (10/23/2022)  Transportation Needs: No Transportation Needs (10/23/2022)  Utilities: Not At Risk (10/23/2022)  Tobacco Use: Low Risk  (10/25/2022)     Readmission Risk Interventions     No data to display

## 2022-10-29 NOTE — Progress Notes (Signed)
    Durable Medical Equipment  (From admission, onward)           Start     Ordered   10/29/22 1207  For home use only DME Bedside commode  Once       Comments: Bariatric size due to body habitus  Question:  Patient needs a bedside commode to treat with the following condition  Answer:  Weakness   10/29/22 1206            Patient requires a bariatric Bedside Commode due to her body habitus. She can not comfortably use a regular size BSC.   Pt needs a BSC as she has issues making it to the restroom in a timely manner on the level of the home in which she will be staying.

## 2022-10-29 NOTE — Telephone Encounter (Signed)
Documentation needed for [facility] Post op day 5 both legs with Kerecis tissue graft. No over wrapping of dressing. Only the new dermatac. No ioban or coban. Left leg macerated strong odor. Peri wound macerated. VAC with blockage alarm on left leg. Less than 100 cc in VAC canister. With cleanse choice sponge or Arthroform dressing. Will leave on for 2 weeks. Peri wound skin damage at 5 days. This was a Friday case Marlene Bast was present for. Right leg with small wound is healthy. Used the large sponge bilaterally.

## 2022-10-29 NOTE — Progress Notes (Signed)
Patient ID: KENZLI CORNELLA, female   DOB: 05-12-1951, 72 y.o.   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  The left wound VAC showed blockage.  There is less than 100 cc in both wound VAC canisters.  The dressing had the original dressing without overwrap of Coban.  Using the new derma tack that had a good seal.  The left leg that showed a blockage had significant amount of fluid beneath the dressing there was odor and maceration of the periwound area.  The Kerecis tissue graft is incorporated.  Examination the right lower extremity the wound is much smaller and there is no periwound maceration there is good incorporation of the Kerecis tissue graft.  No loss of seal with the new derma tack drape.  Start Medihoney dressing changes with 4 x 4 gauze and Ace wrap's bilaterally.

## 2022-10-29 NOTE — Progress Notes (Signed)
PT Cancellation Note  Patient Details Name: [name] MRN: [id number] DOB: [birth year]   Cancelled Treatment:    Reason Eval/Treat Not Completed: Other (comment). Patient tells me she is going home. Declines PT at this time. Will re-attempt if patient still here tomorrow   [name] 10/29/2022, 1:28 PM

## 2022-10-30 ENCOUNTER — Telehealth: Payer: Self-pay

## 2022-10-30 NOTE — Telephone Encounter (Signed)
Another attempt to contact patient. No answer, unable to leave VM.

## 2022-10-30 NOTE — Telephone Encounter (Signed)
Peer to Peer for patient is due by Monday, 11/03/2022 at 1pm.  Please call this number to accept [phone].  Referencing authorization # [id number] Please advise.  Thank you.

## 2022-10-31 NOTE — Telephone Encounter (Signed)
Spoke to patient and her husband ~ Byron.   Patient and husband both are unaware of loop recorder in chest. I asked patient to feel left side of her chest /left breast area to see if she feels a lump under her skin in that are. Patient and her husband attempted, unable to locate lump. Offered/encouraged patient numerous times a device clinic apt to educate/set up monitor in ILR. Patient refused each time I encouraged. Husband also spoke to wife about it and patient declined each time. Advised if she decides to change her mind to please let us know and we will be happy to get her into the clinic. Risk reviewed with patient that we are not able to see what heart rhythms may occur without monitoring. Voiced understanding.

## 2022-11-02 DIAGNOSIS — E1151 Type 2 diabetes mellitus with diabetic peripheral angiopathy without gangrene: Secondary | ICD-10-CM | POA: Insufficient documentation

## 2022-11-02 DIAGNOSIS — Z4800 Encounter for change or removal of nonsurgical wound dressing: Secondary | ICD-10-CM | POA: Insufficient documentation

## 2022-11-02 DIAGNOSIS — L97822 Non-pressure chronic ulcer of other part of left lower leg with fat layer exposed: Secondary | ICD-10-CM | POA: Insufficient documentation

## 2022-11-03 NOTE — Telephone Encounter (Signed)
Denial for Kerecis in office application

## 2022-11-03 NOTE — Telephone Encounter (Signed)
This pt d/c from the hospital on 10/29/2022. Can you please call and make an appt for 11/06/2022 and let me know what time to open to get her in. Thanks!

## 2022-11-03 NOTE — Telephone Encounter (Signed)
I called and sw pt's husband and she is sch for 11/06/2022 at 2:15

## 2022-11-06 ENCOUNTER — Ambulatory Visit (INDEPENDENT_AMBULATORY_CARE_PROVIDER_SITE_OTHER): Payer: Medicare PPO | Admitting: Orthopedic Surgery

## 2022-11-06 ENCOUNTER — Encounter: Payer: Self-pay | Admitting: Orthopedic Surgery

## 2022-11-06 DIAGNOSIS — L97511 Non-pressure chronic ulcer of other part of right foot limited to breakdown of skin: Secondary | ICD-10-CM | POA: Diagnosis not present

## 2022-11-06 DIAGNOSIS — L97921 Non-pressure chronic ulcer of unspecified part of left lower leg limited to breakdown of skin: Secondary | ICD-10-CM

## 2022-11-06 NOTE — Progress Notes (Signed)
Office Visit Note   Patient: [name]           Date of Birth: 10-Nov-1950           MRN: [id number] Visit Date: 11/06/2022              Requested by: Salvatore Decent, PA-C [address] [place],  [place] [address] PCP: Salvatore Decent, PA-C  Chief Complaint  Patient presents with   Left Foot - Routine Post Op    10/24/22 deb of left leg and right ankle   Right Ankle - Routine Post Op      HPI: Patient is a 72 year old woman who presents in follow-up for ulceration to the left leg and right ankle status post debridement.  Assessment & Plan: Visit Diagnoses:  1. Non-pressure chronic ulcer of other part of right foot limited to breakdown of skin (HCC)   2. Skin ulcer of left lower leg, limited to breakdown of skin (HCC)     Plan: Will apply Dynaflex wraps to both lower extremities.  Follow-Up Instructions: Return in about 1 week (around 11/13/2022).   Ortho Exam  Patient is alert, oriented, no adenopathy, well-dressed, normal affect, normal respiratory effort. Examination patient has healthy granulation tissue on the wounds on both feet the right anterior ankle wound is 5 x 4 cm with healthy flat granulation tissue.  The left anterior tibial wound is flat approximately 50% fibrinous tissue that is 5 x 5 cm.  The dorsal foot wound on the left foot is almost completely healed.  Imaging: No results found.     Labs: Lab Results  Component Value Date   HGBA1C 8.1 (H) 10/23/2022   HGBA1C 9.5 (H) 06/11/2022   HGBA1C 11.5 (A) 05/21/2021   ESRSEDRATE 90 (H) 10/23/2022   ESRSEDRATE 117 (H) 03/06/2021   ESRSEDRATE 104 (H) 02/05/2021   CRP 3.8 (H) 10/23/2022   CRP 43.7 (H) 03/06/2021   CRP 0.8 06/29/2019   REPTSTATUS 10/29/2022 FINAL 10/24/2022   GRAMSTAIN  10/24/2022    ABUNDANT SQUAMOUS EPITHELIAL CELLS PRESENT FEW WBC PRESENT, PREDOMINANTLY PMN MODERATE GRAM POSITIVE RODS MODERATE GRAM POSITIVE COCCI FEW GRAM NEGATIVE RODS    CULT  10/24/2022    FEW  STAPHYLOCOCCUS AUREUS ABUNDANT PSEUDOMONAS AERUGINOSA MODERATE PROTEUS MIRABILIS WITHIN MIXED CULTURE NO ANAEROBES ISOLATED Performed at [facility], [address], [place], [place] [address]    LABORGA PROTEUS MIRABILIS 10/24/2022   LABORGA STAPHYLOCOCCUS AUREUS 10/24/2022   LABORGA PSEUDOMONAS AERUGINOSA 10/24/2022     Lab Results  Component Value Date   ALBUMIN 2.8 (L) 10/22/2022   ALBUMIN 2.9 (L) 07/16/2022   ALBUMIN 2.4 (L) 06/27/2022   PREALBUMIN 12.5 (L) 03/16/2017    Lab Results  Component Value Date   MG 2.1 06/27/2022   MG 1.9 06/25/2022   MG 1.6 (L) 06/13/2022   No results found for: "VD25OH"  Lab Results  Component Value Date   PREALBUMIN 12.5 (L) 03/16/2017      Latest Ref Rng & Units 10/25/2022   10:58 AM 10/22/2022    5:54 PM 07/16/2022    4:19 PM  CBC EXTENDED  WBC 4.0 - 10.5 K/uL 9.9  9.2  11.1   RBC 3.87 - 5.11 MIL/uL 4.66  4.63  4.51   Hemoglobin 12.0 - 15.0 g/dL 82.9  56.2  13.0   HCT 36.0 - 46.0 % 39.8  39.2  38.6   Platelets 150 - 400 K/uL 413  406  375   NEUT#  1.7 - 7.7 K/uL  6.0    Lymph# 0.7 - 4.0 K/uL  2.1       There is no height or weight on file to calculate BMI.  Orders:  No orders of the defined types were placed in this encounter.  No orders of the defined types were placed in this encounter.    Procedures: No procedures performed  Clinical Data: No additional findings.  ROS:  All other systems negative, except as noted in the HPI. Review of Systems  Objective: Vital Signs: There were no vitals taken for this visit.  Specialty Comments:  No specialty comments available.  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   Wound infection 02/05/2021   Venous stasis dermatitis of both lower extremities    Pneumonia due to COVID-19 virus 06/29/2019   Hypophosphatemia 06/24/2019   Obesity 06/23/2019   Insulin dependent type 2 diabetes mellitus (HCC) 06/22/2019   Anxiety 06/22/2019   Depressive disorder 06/22/2019   Essential hypertension 06/22/2019   HLD (hyperlipidemia) 06/22/2019   COVID-19 06/21/2019   Severe protein-calorie malnutrition (HCC) 06/21/2019   Acute respiratory failure with hypoxia (HCC) 06/21/2019   Diabetic foot ulcer associated with type 2 diabetes mellitus (HCC)    Cellulitis 07/05/2016   Depression with anxiety 05/30/2016   Chronic skin ulcer of lower leg (HCC) 05/30/2016   Generalized weakness 05/30/2016   Left arm weakness 05/30/2016   Left Lower extremity pain  04/02/2016   Lower extremity pain, inferior, left 04/02/2016   Left leg pain    Personal history of noncompliance with medical treatment, presenting hazards to health 03/31/2016   Jaw pain 03/27/2016   Left leg cellulitis 03/24/2016   Cellulitis of left lower extremity    Primary insomnia    Adjustment disorder    Morbid obesity (HCC) 01/27/2016   Wheelchair dependent 01/27/2016   Gout 01/27/2016   Asthma 01/27/2016   Arthritis 01/27/2016   Uncontrolled type 2 diabetes mellitus with hypoglycemia, with long-term current use of insulin (HCC) 01/25/2016   Cellulitis of left leg 01/11/2016   Insulin-requiring or dependent type II diabetes mellitus (HCC) 01/11/2016   Past Medical History:  Diagnosis Date   Anxiety    Arthritis    "back, arms, legs" (03/24/2016)   Asthma    Chronic lower back pain     Colonic polyp    last colonoscopy done in 2009 with normal results per medical record   Depressive disorder    Gastric polyp    Gout    has taken allopurinol 300mg  once daily in past   Headache    History of hiatal hernia    Hypertension    Migraine    "none in awhile; might have a couple/year" (03/24/2016)   Mixed hyperlipidemia    01/2016 Total chol 141, HDL 59, LDL 63, ration 1.1   Osteoarthritis    TIA (transient ischemic attack) 11/2014   Type II diabetes  mellitus (HCC)     Family History  Problem Relation Age of Onset   Stroke Father    Stroke Brother    Cancer Other    Gout Mother    Hypertension Mother    Arthritis Mother    Asthma Mother    Stroke Brother     Past Surgical History:  Procedure Laterality Date   ARTERY BIOPSY Right 08/12/2016   Procedure: BIOPSY TEMPORAL ARTERY;  Surgeon: Sherren Kerns, MD;  [facility] OR;  Service: Vascular;  Laterality: Right;  BIOPSY TEMPORAL ARTERY   BREAST BIOPSY Left ~ 2015   benign   CARPAL TUNNEL RELEASE Bilateral    DILATION AND CURETTAGE OF UTERUS     I & D EXTREMITY Bilateral 10/24/2022   Procedure: DEBRIDEMENT LEFT LEG AND RIGHT ANKLE;  Surgeon: Nadara Mustard, MD;  Location: [facility] OR;  Service: Orthopedics;  Laterality: Bilateral;   IR CT HEAD LTD  06/11/2022   IR PERCUTANEOUS ART THROMBECTOMY/INFUSION INTRACRANIAL INC DIAG ANGIO  06/11/2022   IR US GUIDE VASC ACCESS RIGHT  06/11/2022   KNEE ARTHROSCOPY Right 2003   in [facility]   LAPAROSCOPIC CHOLECYSTECTOMY     LOOP RECORDER INSERTION N/A 06/26/2022   Procedure: LOOP RECORDER INSERTION;  Surgeon: Regan Lemming, MD;  Location: [facility] INVASIVE CV LAB;  Service: Cardiovascular;  Laterality: N/A;   RADIOLOGY WITH ANESTHESIA N/A 06/11/2022   Procedure: IR WITH ANESTHESIA;  Surgeon: Radiologist, Medication, MD;  Location: [facility] OR;  Service: Radiology;  Laterality: N/A;   TUBAL LIGATION     VAGINAL HYSTERECTOMY  1982   Social History   Occupational History   Not on file   Tobacco Use   Smoking status: Never   Smokeless tobacco: Never  Vaping Use   Vaping Use: Never used  Substance and Sexual Activity   Alcohol use: No   Drug use: No   Sexual activity: Never

## 2022-11-11 DIAGNOSIS — E11622 Type 2 diabetes mellitus with other skin ulcer: Secondary | ICD-10-CM | POA: Insufficient documentation

## 2022-11-13 ENCOUNTER — Ambulatory Visit (INDEPENDENT_AMBULATORY_CARE_PROVIDER_SITE_OTHER): Payer: Medicare PPO | Admitting: Orthopedic Surgery

## 2022-11-13 DIAGNOSIS — I87333 Chronic venous hypertension (idiopathic) with ulcer and inflammation of bilateral lower extremity: Secondary | ICD-10-CM | POA: Diagnosis not present

## 2022-11-14 ENCOUNTER — Encounter: Payer: Self-pay | Admitting: Orthopedic Surgery

## 2022-11-14 NOTE — Progress Notes (Signed)
Office Visit Note   Patient: [name]           Date of Birth: Oct 31, 1950           MRN: [id number] Visit Date: 11/13/2022              Requested by: Salvatore Decent, PA-C [address] [place],  [place] [address] PCP: Salvatore Decent, PA-C  Chief Complaint  Patient presents with   Left Foot - Routine Post Op    10/24/22 deb of left leg and right ankle   Right Ankle - Routine Post Op      HPI: Patient is a 72 year old woman who is seen in follow-up status post debridement bilateral lower extremities.  Patient has been having home health nursing change the compression wraps at home 3 times a week.  Assessment & Plan: Visit Diagnoses:  1. Chronic venous hypertension (idiopathic) with ulcer and inflammation of bilateral lower extremity (HCC)     Plan: Dynaflex wraps were reapplied to both lower extremities.  Follow-Up Instructions: Return in about 4 weeks (around 12/11/2022).   Ortho Exam  Patient is alert, oriented, no adenopathy, well-dressed, normal affect, normal respiratory effort. Examination the ulcers are improving there is decreased maceration there is no cellulitis.  Imaging: No results found.   Labs: Lab Results  Component Value Date   HGBA1C 8.1 (H) 10/23/2022   HGBA1C 9.5 (H) 06/11/2022   HGBA1C 11.5 (A) 05/21/2021   ESRSEDRATE 90 (H) 10/23/2022   ESRSEDRATE 117 (H) 03/06/2021   ESRSEDRATE 104 (H) 02/05/2021   CRP 3.8 (H) 10/23/2022   CRP 43.7 (H) 03/06/2021   CRP 0.8 06/29/2019   REPTSTATUS 10/29/2022 FINAL 10/24/2022   GRAMSTAIN  10/24/2022    ABUNDANT SQUAMOUS EPITHELIAL CELLS PRESENT FEW WBC PRESENT, PREDOMINANTLY PMN MODERATE GRAM POSITIVE RODS MODERATE GRAM POSITIVE COCCI FEW GRAM NEGATIVE RODS    CULT  10/24/2022    FEW STAPHYLOCOCCUS AUREUS ABUNDANT PSEUDOMONAS AERUGINOSA MODERATE PROTEUS MIRABILIS WITHIN MIXED CULTURE NO ANAEROBES ISOLATED Performed at [facility], [address], [place], [place] [address]     LABORGA PROTEUS MIRABILIS 10/24/2022   LABORGA STAPHYLOCOCCUS AUREUS 10/24/2022   LABORGA PSEUDOMONAS AERUGINOSA 10/24/2022     Lab Results  Component Value Date   ALBUMIN 2.8 (L) 10/22/2022   ALBUMIN 2.9 (L) 07/16/2022   ALBUMIN 2.4 (L) 06/27/2022   PREALBUMIN 12.5 (L) 03/16/2017    Lab Results  Component Value Date   MG 2.1 06/27/2022   MG 1.9 06/25/2022   MG 1.6 (L) 06/13/2022   No results found for: "VD25OH"  Lab Results  Component Value Date   PREALBUMIN 12.5 (L) 03/16/2017      Latest Ref Rng & Units 10/25/2022   10:58 AM 10/22/2022    5:54 PM 07/16/2022    4:19 PM  CBC EXTENDED  WBC 4.0 - 10.5 K/uL 9.9  9.2  11.1   RBC 3.87 - 5.11 MIL/uL 4.66  4.63  4.51   Hemoglobin 12.0 - 15.0 g/dL 82.9  56.2  13.0   HCT 36.0 - 46.0 % 39.8  39.2  38.6   Platelets 150 - 400 K/uL 413  406  375   NEUT# 1.7 - 7.7 K/uL  6.0    Lymph# 0.7 - 4.0 K/uL  2.1       There is no height or weight on file to calculate BMI.  Orders:  No orders of the defined types were placed in this encounter.  No orders of  the defined types were placed in this encounter.    Procedures: No procedures performed  Clinical Data: No additional findings.  ROS:  All other systems negative, except as noted in the HPI. Review of Systems  Objective: Vital Signs: There were no vitals taken for this visit.  Specialty Comments:  No specialty comments available.  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   Wound infection 02/05/2021   Venous stasis dermatitis of both lower extremities    Pneumonia due to COVID-19 virus 06/29/2019   Hypophosphatemia 06/24/2019   Obesity 06/23/2019   Insulin dependent type 2 diabetes mellitus (HCC) 06/22/2019   Anxiety 06/22/2019   Depressive disorder 06/22/2019   Essential hypertension 06/22/2019   HLD (hyperlipidemia) 06/22/2019   COVID-19 06/21/2019   Severe protein-calorie malnutrition (HCC) 06/21/2019   Acute respiratory failure with hypoxia (HCC) 06/21/2019   Diabetic foot ulcer associated with type 2 diabetes mellitus (HCC)    Cellulitis 07/05/2016   Depression with anxiety 05/30/2016   Chronic skin ulcer of lower leg (HCC) 05/30/2016   Generalized weakness 05/30/2016   Left arm weakness 05/30/2016   Left Lower extremity pain  04/02/2016   Lower extremity pain, inferior, left 04/02/2016   Left leg pain    Personal history of noncompliance with medical treatment, presenting hazards to health 03/31/2016   Jaw pain 03/27/2016   Left leg cellulitis 03/24/2016   Cellulitis of left lower extremity    Primary insomnia    Adjustment disorder    Morbid obesity (HCC) 01/27/2016   Wheelchair dependent 01/27/2016   Gout 01/27/2016   Asthma 01/27/2016   Arthritis 01/27/2016   Uncontrolled type 2 diabetes mellitus with hypoglycemia, with long-term current use of insulin (HCC) 01/25/2016   Cellulitis of left leg 01/11/2016   Insulin-requiring or dependent type II diabetes mellitus (HCC) 01/11/2016   Past Medical History:  Diagnosis Date   Anxiety    Arthritis    "back, arms, legs" (03/24/2016)   Asthma    Chronic lower back pain    Colonic polyp    last colonoscopy done in 2009 with normal results per medical record   Depressive disorder    Gastric polyp    Gout    has taken allopurinol 300mg  once daily in past   Headache     History of hiatal hernia    Hypertension    Migraine    "none in awhile; might have a couple/year" (03/24/2016)   Mixed hyperlipidemia    01/2016 Total chol 141, HDL 59, LDL 63, ration 1.1   Osteoarthritis    TIA (transient ischemic attack) 11/2014   Type II diabetes mellitus (HCC)     Family History  Problem Relation Age of Onset   Stroke Father    Stroke Brother    Cancer Other    Gout Mother    Hypertension Mother    Arthritis Mother    Asthma Mother  Stroke Brother     Past Surgical History:  Procedure Laterality Date   ARTERY BIOPSY Right 08/12/2016   Procedure: BIOPSY TEMPORAL ARTERY;  Surgeon: Sherren Kerns, MD;  Location: [facility] OR;  Service: Vascular;  Laterality: Right;  BIOPSY TEMPORAL ARTERY   BREAST BIOPSY Left ~ 2015   benign   CARPAL TUNNEL RELEASE Bilateral    DILATION AND CURETTAGE OF UTERUS     I & D EXTREMITY Bilateral 10/24/2022   Procedure: DEBRIDEMENT LEFT LEG AND RIGHT ANKLE;  Surgeon: Nadara Mustard, MD;  Location: [facility] OR;  Service: Orthopedics;  Laterality: Bilateral;   IR CT HEAD LTD  06/11/2022   IR PERCUTANEOUS ART THROMBECTOMY/INFUSION INTRACRANIAL INC DIAG ANGIO  06/11/2022   IR US GUIDE VASC ACCESS RIGHT  06/11/2022   KNEE ARTHROSCOPY Right 2003   [facility]   LAPAROSCOPIC CHOLECYSTECTOMY     LOOP RECORDER INSERTION N/A 06/26/2022   Procedure: LOOP RECORDER INSERTION;  Surgeon: Regan Lemming, MD;  Location: [facility] INVAS[facility] LAB;  Service: Cardiovascular;  Laterality: N/A;   RADIOLOGY WITH ANESTHESIA N/A 06/11/2022   Procedure: IR WITH ANESTHESIA;  Surgeon: Radiologist, Medication, MD;  Location: [facility] OR;  Service: Radiology;  Laterality: N/A;   TUBAL LIGATION     VAGINAL HYSTERECTOMY  1982   Social History   Occupational History   Not on file  Tobacco Use   Smoking status: Never   Smokeless tobacco: Never  Vaping Use   Vaping Use: Never used  Substance and Sexual Activity   Alcohol use: No   Drug use: No   Sexual activity: Never

## 2022-11-18 ENCOUNTER — Encounter: Payer: Self-pay | Admitting: Neurology

## 2022-11-18 ENCOUNTER — Inpatient Hospital Stay: Payer: Medicare PPO | Admitting: Neurology

## 2022-11-19 ENCOUNTER — Encounter (HOSPITAL_COMMUNITY): Payer: Self-pay | Admitting: Orthopedic Surgery

## 2022-11-20 ENCOUNTER — Encounter: Payer: Medicare PPO | Admitting: Orthopedic Surgery

## 2022-12-11 ENCOUNTER — Ambulatory Visit (INDEPENDENT_AMBULATORY_CARE_PROVIDER_SITE_OTHER): Payer: Medicare PPO | Admitting: Orthopedic Surgery

## 2022-12-11 ENCOUNTER — Encounter: Payer: Self-pay | Admitting: Orthopedic Surgery

## 2022-12-11 ENCOUNTER — Encounter: Payer: Medicare PPO | Admitting: Orthopedic Surgery

## 2022-12-11 DIAGNOSIS — L97511 Non-pressure chronic ulcer of other part of right foot limited to breakdown of skin: Secondary | ICD-10-CM | POA: Diagnosis not present

## 2022-12-11 DIAGNOSIS — I87333 Chronic venous hypertension (idiopathic) with ulcer and inflammation of bilateral lower extremity: Secondary | ICD-10-CM | POA: Diagnosis not present

## 2022-12-11 DIAGNOSIS — L97921 Non-pressure chronic ulcer of unspecified part of left lower leg limited to breakdown of skin: Secondary | ICD-10-CM | POA: Diagnosis not present

## 2022-12-11 NOTE — Progress Notes (Signed)
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  She has been in compression wraps.  Assessment & Plan: Visit Diagnoses:  1. Chronic venous hypertension (idiopathic) with ulcer and inflammation of bilateral lower extremity (HCC)   2. Non-pressure chronic ulcer of other part of right foot limited to breakdown of skin (HCC)   3. Skin ulcer of left lower leg, limited to breakdown of skin (HCC)     Plan: Continue serial compression wraps.  Follow-Up Instructions: Return in about 1 week (around 12/18/2022).   Ortho Exam  Patient is alert, oriented, no adenopathy, well-dressed, normal affect, normal respiratory effort. Examination the legs are wrinkling there is decreased swelling the ulcers are improving.  There are 3 large ulcers on the left leg 2 large ulcers on the right leg.  Imaging: No results found.     Labs: Lab Results  Component Value Date   HGBA1C 8.1 (H) 10/23/2022   HGBA1C 9.5 (H) 06/11/2022   HGBA1C 11.5 (A) 05/21/2021   ESRSEDRATE 90 (H) 10/23/2022   ESRSEDRATE 117 (H) 03/06/2021   ESRSEDRATE 104 (H) 02/05/2021   CRP 3.8 (H) 10/23/2022   CRP 43.7 (H) 03/06/2021   CRP 0.8 06/29/2019   REPTSTATUS 10/29/2022 FINAL 10/24/2022   GRAMSTAIN  10/24/2022    ABUNDANT SQUAMOUS EPITHELIAL CELLS PRESENT FEW WBC PRESENT, PREDOMINANTLY PMN MODERATE GRAM POSITIVE RODS MODERATE GRAM POSITIVE COCCI FEW GRAM NEGATIVE RODS    CULT  10/24/2022    FEW STAPHYLOCOCCUS AUREUS ABUNDANT PSEUDOMONAS AERUGINOSA MODERATE PROTEUS  MIRABILIS WITHIN MIXED CULTURE NO ANAEROBES ISOLATED Performed at [facility], [address] [place], [place] [address]    LABORGA PROTEUS MIRABILIS 10/24/2022   LABORGA STAPHYLOCOCCUS AUREUS 10/24/2022   LABORGA PSEUDOMONAS AERUGINOSA 10/24/2022     Lab Results  Component Value Date   ALBUMIN 2.8 (L) 10/22/2022   ALBUMIN 2.9 (L) 07/16/2022   ALBUMIN 2.4 (L) 06/27/2022   PREALBUMIN 12.5 (L) 03/16/2017    Lab Results  Component Value Date   MG 2.1 06/27/2022   MG 1.9 06/25/2022   MG 1.6 (L) 06/13/2022   No results found for: "VD25OH"  Lab Results  Component Value Date   PREALBUMIN 12.5 (L) 03/16/2017      Latest Ref Rng & Units 10/25/2022   10:58 AM 10/22/2022    5:54 PM 07/16/2022    4:19 PM  CBC EXTENDED  WBC 4.0 - 10.5 K/uL 9.9  9.2  11.1   RBC 3.87 - 5.11 MIL/uL 4.66  4.63  4.51   Hemoglobin 12.0 - 15.0 g/dL 65.7  84.6  96.2   HCT 36.0 - 46.0 % 39.8  39.2  38.6   Platelets 150 - 400 K/uL 413  406  375   NEUT# 1.7 - 7.7 K/uL  6.0    Lymph# 0.7 - 4.0 K/uL  2.1  There is no height or weight on file to calculate BMI.  Orders:  No orders of the defined types were placed in this encounter.  No orders of the defined types were placed in this encounter.    Procedures: No procedures performed  Clinical Data: No additional findings.  ROS:  All other systems negative, except as noted in the HPI. Review of Systems  Objective: Vital Signs: There were no vitals taken for this visit.  Specialty Comments:  No specialty comments available.  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   Wound infection 02/05/2021   Venous stasis dermatitis of both lower extremities    Pneumonia due to COVID-19 virus 06/29/2019   Hypophosphatemia 06/24/2019   Obesity 06/23/2019   Insulin dependent type 2 diabetes mellitus (HCC) 06/22/2019   Anxiety 06/22/2019   Depressive disorder 06/22/2019   Essential hypertension 06/22/2019   HLD (hyperlipidemia) 06/22/2019   COVID-19 06/21/2019   Severe protein-calorie malnutrition (HCC) 06/21/2019   Acute respiratory failure with hypoxia (HCC) 06/21/2019   Diabetic foot ulcer associated with type 2 diabetes mellitus (HCC)    Cellulitis 07/05/2016   Depression with anxiety 05/30/2016   Chronic skin ulcer of lower leg (HCC) 05/30/2016   Generalized weakness 05/30/2016   Left arm weakness 05/30/2016   Left Lower extremity pain  04/02/2016   Lower extremity pain, inferior, left 04/02/2016   Left leg pain    Personal history of noncompliance with medical treatment, presenting hazards to health 03/31/2016   Jaw pain 03/27/2016   Left leg cellulitis 03/24/2016   Cellulitis of left lower extremity    Primary insomnia    Adjustment disorder    Morbid obesity (HCC) 01/27/2016   Wheelchair dependent 01/27/2016   Gout 01/27/2016   Asthma 01/27/2016   Arthritis 01/27/2016   Uncontrolled type 2 diabetes mellitus with hypoglycemia, with long-term current use of insulin (HCC) 01/25/2016   Cellulitis of left leg 01/11/2016   Insulin-requiring or dependent type II diabetes mellitus (HCC) 01/11/2016   Past Medical History:  Diagnosis Date   Anxiety    Arthritis    "back, arms, legs" (03/24/2016)   Asthma    Chronic lower back pain    Colonic polyp    last colonoscopy done in 2009 with normal results per  medical record   Depressive disorder    Gastric polyp    Gout    has taken allopurinol 300mg  once daily in past   Headache    History of hiatal hernia    Hypertension    Migraine    "none in awhile; might have a couple/year" (03/24/2016)   Mixed hyperlipidemia    01/2016 Total chol 141, HDL 59, LDL 63, ration 1.1   Osteoarthritis    TIA (transient ischemic attack) 11/2014   Type II diabetes mellitus (HCC)     Family History  Problem Relation Age of Onset   Stroke Father    Stroke  Brother    Cancer Other    Gout Mother    Hypertension Mother    Arthritis Mother    Asthma Mother    Stroke Brother     Past Surgical History:  Procedure Laterality Date   ARTERY BIOPSY Right 08/12/2016   Procedure: BIOPSY TEMPORAL ARTERY;  Surgeon: Sherren Kerns, MD;  Location: [facility] OR;  Service: Vascular;  Laterality: Right;  BIOPSY TEMPORAL ARTERY   BREAST BIOPSY Left ~ 2015   benign   CARPAL TUNNEL RELEASE Bilateral    DILATION AND CURETTAGE OF UTERUS     I & D EXTREMITY Bilateral 10/24/2022   Procedure: DEBRIDEMENT LEFT LEG AND RIGHT ANKLE;  Surgeon: Nadara Mustard, MD;  Location: [facility] OR;  Service: Orthopedics;  Laterality: Bilateral;   IR CT HEAD LTD  06/11/2022   IR PERCUTANEOUS ART THROMBECTOMY/INFUSION INTRACRANIAL INC DIAG ANGIO  06/11/2022   IR US GUIDE VASC ACCESS RIGHT  06/11/2022   KNEE ARTHROSCOPY Right 2003   in [facility]   LAPAROSCOPIC CHOLECYSTECTOMY     LOOP RECORDER INSERTION N/A 06/26/2022   Procedure: LOOP RECORDER INSERTION;  Surgeon: Regan Lemming, MD;  [facility];  Service: Cardiovascular;  Laterality: N/A;   RADIOLOGY WITH ANESTHESIA N/A 06/11/2022   Procedure: IR WITH ANESTHESIA;  Surgeon: Radiologist, Medication, MD;  Location: [facility] OR;  Service: Radiology;  Laterality: N/A;   TUBAL LIGATION     VAGINAL HYSTERECTOMY  1982   Social History   Occupational History   Not on file  Tobacco Use   Smoking status: Never   Smokeless tobacco: Never   Vaping Use   Vaping Use: Never used  Substance and Sexual Activity   Alcohol use: No   Drug use: No   Sexual activity: Never

## 2022-12-17 ENCOUNTER — Telehealth: Payer: Self-pay | Admitting: Surgical

## 2022-12-17 ENCOUNTER — Ambulatory Visit (INDEPENDENT_AMBULATORY_CARE_PROVIDER_SITE_OTHER): Payer: Medicare PPO | Admitting: Family

## 2022-12-17 ENCOUNTER — Telehealth: Payer: Self-pay | Admitting: Family

## 2022-12-17 DIAGNOSIS — I87333 Chronic venous hypertension (idiopathic) with ulcer and inflammation of bilateral lower extremity: Secondary | ICD-10-CM

## 2022-12-17 DIAGNOSIS — L97921 Non-pressure chronic ulcer of unspecified part of left lower leg limited to breakdown of skin: Secondary | ICD-10-CM | POA: Diagnosis not present

## 2022-12-17 DIAGNOSIS — L97511 Non-pressure chronic ulcer of other part of right foot limited to breakdown of skin: Secondary | ICD-10-CM | POA: Diagnosis not present

## 2022-12-17 DIAGNOSIS — B353 Tinea pedis: Secondary | ICD-10-CM | POA: Diagnosis not present

## 2022-12-17 NOTE — Telephone Encounter (Signed)
Pt would like Pain meds called into [facility] on [address]

## 2022-12-19 ENCOUNTER — Encounter: Payer: Self-pay | Admitting: Family

## 2022-12-19 MED ORDER — OXYCODONE-ACETAMINOPHEN 5-325 MG PO TABS: 1.0000 | ORAL_TABLET | Freq: Four times a day (QID) | ORAL | 0 refills | Status: DC | PRN

## 2022-12-19 MED ORDER — TERBINAFINE HCL 250 MG PO TABS: 250.0000 mg | ORAL_TABLET | Freq: Every day | ORAL | 0 refills | Status: AC

## 2022-12-19 NOTE — Progress Notes (Signed)
Office Visit Note   Patient: [name]           Date of Birth: [birth year]           MRN: [id number] Visit Date: 12/17/2022              Requested by: Salvatore Decent, PA-C 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  She has been in compression wraps for the last 1 week she continues to complain of intense pain  Assessment & Plan: Visit Diagnoses:  No diagnosis found.   Plan: Continue serial compression wraps.  Will follow-up in 1 week  Follow-Up Instructions: No follow-ups on file.   Ortho Exam  Patient is alert, oriented, no adenopathy, well-dressed, normal affect, normal respiratory effort. On examination of the bilateral legs there is good wrinkling of the skin up to the area of compression.  The ulcers to the left lower extremity are nearly healed however she does have significant area of maceration and ulceration over the dorsum of the forefoot which appears to be concerning for a tinea infection.  There is an associated odor no purulence or erythema  To the right foot she has a dorsal ankle ulcer which is stable this is exquisitely tender there is no purulence or erythema associated no sign of infection debrided of fibrinous exudative tissue with gauze. Imaging: No results found.     Labs: Lab Results  Component Value Date   HGBA1C 8.1 (H) 10/23/2022   HGBA1C 9.5 (H) 06/11/2022   HGBA1C 11.5 (A) 05/21/2021   ESRSEDRATE 90 (H) 10/23/2022   ESRSEDRATE 117 (H) 03/06/2021   ESRSEDRATE 104 (H) 02/05/2021   CRP 3.8 (H) 10/23/2022   CRP 43.7 (H) 03/06/2021   CRP 0.8 06/29/2019   REPTSTATUS 10/29/2022 FINAL 10/24/2022   GRAMSTAIN  10/24/2022    ABUNDANT SQUAMOUS EPITHELIAL CELLS  PRESENT FEW WBC PRESENT, PREDOMINANTLY PMN MODERATE GRAM POSITIVE RODS MODERATE GRAM POSITIVE COCCI FEW GRAM NEGATIVE RODS    CULT  10/24/2022    FEW STAPHYLOCOCCUS AUREUS ABUNDANT PSEUDOMONAS AERUGINOSA MODERATE PROTEUS MIRABILIS WITHIN MIXED CULTURE NO ANAEROBES ISOLATED Performed at [facility], [address], [place], [place] [address]    LABORGA PROTEUS MIRABILIS 10/24/2022   LABORGA STAPHYLOCOCCUS AUREUS 10/24/2022   LABORGA PSEUDOMONAS AERUGINOSA 10/24/2022     Lab Results  Component Value Date   ALBUMIN 2.8 (L) 10/22/2022   ALBUMIN 2.9 (L) 07/16/2022   ALBUMIN 2.4 (L) 06/27/2022   PREALBUMIN 12.5 (L) 03/16/2017    Lab Results  Component Value Date   MG 2.1 06/27/2022   MG 1.9 06/25/2022   MG 1.6 (L) 06/13/2022   No results found for: "VD25OH"  Lab Results  Component Value Date   PREALBUMIN 12.5 (L) 03/16/2017      Latest Ref Rng & Units 10/25/2022   10:58 AM 10/22/2022    5:54 PM 07/16/2022    4:19 PM  CBC EXTENDED  WBC 4.0 - 10.5 K/uL 9.9  9.2  11.1   RBC 3.87 - 5.11 MIL/uL 4.66  4.63  4.51   Hemoglobin 12.0 - 15.0 g/dL 30.8  65.7  84.6   HCT 36.0 -  46.0 % 39.8  39.2  38.6   Platelets 150 - 400 K/uL 413  406  375   NEUT# 1.7 - 7.7 K/uL  6.0    Lymph# 0.7 - 4.0 K/uL  2.1       There is no height or weight on file to calculate BMI.  Orders:  No orders of the defined types were placed in this encounter.  No orders of the defined types were placed in this encounter.    Procedures: No procedures performed  Clinical Data: No additional findings.  ROS:  All other systems negative, except as noted in the HPI. Review of Systems  Objective: Vital Signs: There were no vitals taken for this visit.  Specialty Comments:  No specialty comments available.  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   Wound infection 02/05/2021   Venous stasis dermatitis of both lower extremities    Pneumonia due to COVID-19 virus 06/29/2019   Hypophosphatemia 06/24/2019   Obesity 06/23/2019   Insulin dependent type 2 diabetes mellitus (HCC) 06/22/2019   Anxiety 06/22/2019   Depressive disorder 06/22/2019   Essential hypertension 06/22/2019   HLD (hyperlipidemia) 06/22/2019   COVID-19 06/21/2019   Severe protein-calorie malnutrition (HCC) 06/21/2019   Acute respiratory failure with hypoxia (HCC) 06/21/2019   Diabetic foot ulcer associated with type 2 diabetes mellitus (HCC)    Cellulitis 07/05/2016   Depression with anxiety 05/30/2016   Chronic skin ulcer of lower leg (HCC) 05/30/2016   Generalized weakness 05/30/2016   Left arm weakness 05/30/2016   Left Lower extremity pain  04/02/2016   Lower extremity pain, inferior, left 04/02/2016   Left leg pain    Personal history of noncompliance with medical treatment, presenting hazards to health 03/31/2016   Jaw pain 03/27/2016   Left leg cellulitis 03/24/2016   Cellulitis of left lower extremity    Primary insomnia    Adjustment disorder    Morbid obesity (HCC) 01/27/2016   Wheelchair dependent 01/27/2016   Gout 01/27/2016   Asthma 01/27/2016   Arthritis 01/27/2016   Uncontrolled type 2 diabetes mellitus with hypoglycemia, with long-term current use of insulin (HCC) 01/25/2016   Cellulitis of left leg 01/11/2016   Insulin-requiring or dependent type II diabetes mellitus  (HCC) 01/11/2016   Past Medical History:  Diagnosis Date   Anxiety    Arthritis    "back, arms, legs" (03/24/2016)   Asthma    Chronic lower back pain    Colonic polyp    last colonoscopy done in 2009 with normal results per medical record   Depressive disorder    Gastric polyp    Gout    has taken allopurinol 300mg  once daily in past   Headache    History of hiatal hernia    Hypertension    Migraine    "none in awhile; might have a couple/year" (03/24/2016)   Mixed hyperlipidemia    01/2016 Total chol 141,  HDL 59, LDL 63, ration 1.1   Osteoarthritis    TIA (transient ischemic attack) 11/2014   Type II diabetes mellitus (HCC)     Family History  Problem Relation Age of Onset   Stroke Father    Stroke Brother    Cancer Other    Gout Mother    Hypertension Mother    Arthritis Mother    Asthma Mother    Stroke Brother     Past Surgical History:  Procedure Laterality Date   ARTERY BIOPSY Right 08/12/2016   Procedure: BIOPSY TEMPORAL ARTERY;  Surgeon: Sherren Kerns, MD;  Location: [facility] OR;  Service: Vascular;  Laterality: Right;  BIOPSY TEMPORAL ARTERY   BREAST BIOPSY Left ~ 2015   benign   CARPAL TUNNEL RELEASE Bilateral    DILATION AND CURETTAGE OF UTERUS     I & D EXTREMITY Bilateral 10/24/2022   Procedure: DEBRIDEMENT LEFT LEG AND RIGHT ANKLE;  Surgeon: Nadara Mustard, MD;  Location: [facility] OR;  Service: Orthopedics;  Laterality: Bilateral;   IR CT HEAD LTD  06/11/2022   IR PERCUTANEOUS ART THROMBECTOMY/INFUSION INTRACRANIAL INC DIAG ANGIO  06/11/2022   IR US GUIDE VASC ACCESS RIGHT  06/11/2022   KNEE ARTHROSCOPY Right 2003   in [facility]   LAPAROSCOPIC CHOLECYSTECTOMY     LOOP RECORDER INSERTION N/A 06/26/2022   Procedure: LOOP RECORDER INSERTION;  Surgeon: Regan Lemming, MD;  [facility];  Service: Cardiovascular;  Laterality: N/A;   RADIOLOGY WITH ANESTHESIA N/A 06/11/2022   Procedure: IR WITH ANESTHESIA;  Surgeon: Radiologist, Medication, MD;   Location: [facility] OR;  Service: Radiology;  Laterality: N/A;   TUBAL LIGATION     VAGINAL HYSTERECTOMY  1982   Social History   Occupational History   Not on file  Tobacco Use   Smoking status: Never   Smokeless tobacco: Never  Vaping Use   Vaping Use: Never used  Substance and Sexual Activity   Alcohol use: No   Drug use: No   Sexual activity: Never

## 2022-12-24 ENCOUNTER — Encounter: Payer: Self-pay | Admitting: Family

## 2022-12-24 ENCOUNTER — Ambulatory Visit (INDEPENDENT_AMBULATORY_CARE_PROVIDER_SITE_OTHER): Payer: Medicare PPO | Admitting: Family

## 2022-12-24 DIAGNOSIS — I87333 Chronic venous hypertension (idiopathic) with ulcer and inflammation of bilateral lower extremity: Secondary | ICD-10-CM

## 2022-12-24 DIAGNOSIS — L97511 Non-pressure chronic ulcer of other part of right foot limited to breakdown of skin: Secondary | ICD-10-CM | POA: Diagnosis not present

## 2022-12-24 DIAGNOSIS — B879 Myiasis, unspecified: Secondary | ICD-10-CM | POA: Diagnosis not present

## 2022-12-24 DIAGNOSIS — B353 Tinea pedis: Secondary | ICD-10-CM | POA: Diagnosis not present

## 2022-12-24 NOTE — Progress Notes (Signed)
Office Visit Note   Patient: [name]           Date of Birth: [birth year]           MRN: [id number] Visit Date: 12/24/2022              Requested by: Salvatore Decent, PA-C [address] [place],  [place] [address] PCP: Salvatore Decent, PA-C  Chief Complaint  Patient presents with   Left Leg - Routine Post Op    10/24/22 deb of left leg and right ankle   Right Ankle - Routine Post Op      HPI: The patient is a 72 year old woman seen in follow up for chronic venous and lymphatic ulcers. She removed her compression wraps several days ago. Has been wrapping with ABD pads and Kerlix.  She states that she slept in her recliner last night with her legs dependent.  She has difficulty donning compression garments.  Difficulty tolerating compression as well  Assessment & Plan: Visit Diagnoses: No diagnosis found.  Plan: Discussed the importance of compression and wound healing.  Discussed elevating the lower extremities as much as possible around-the-clock the patient would like to try using compression stockings she was placed in a pair today and given an extra for her dressing changes.  She will remove these for her daily hygiene Dial soap cleansing bilateral lower extremities elevate around-the-clock follow-up in 1 week  Follow-Up Instructions: No follow-ups on file.   Ortho Exam  Patient is alert, oriented, no adenopathy, well-dressed, normal affect, normal respiratory effort. On examination bilateral lower extremities there is uncontrolled edema with venous as well as lymphatic she has had significant worsening of the left lower extremity ulcer this now extends around the anterior aspect of her shin and she has new ulceration with maceration to the posterior left leg there are maggots present in the ulcers to the left foot On examination of the right foot the ulcer is worse in appearance was fibrinous exudative tissue burden there is scant serous drainage.   Imaging: No  results found.   Labs: Lab Results  Component Value Date   HGBA1C 8.1 (H) 10/23/2022   HGBA1C 9.5 (H) 06/11/2022   HGBA1C 11.5 (A) 05/21/2021   ESRSEDRATE 90 (H) 10/23/2022   ESRSEDRATE 117 (H) 03/06/2021   ESRSEDRATE 104 (H) 02/05/2021   CRP 3.8 (H) 10/23/2022   CRP 43.7 (H) 03/06/2021   CRP 0.8 06/29/2019   REPTSTATUS 10/29/2022 FINAL 10/24/2022   GRAMSTAIN  10/24/2022    ABUNDANT SQUAMOUS EPITHELIAL CELLS PRESENT FEW WBC PRESENT, PREDOMINANTLY PMN MODERATE GRAM POSITIVE RODS MODERATE GRAM POSITIVE COCCI FEW GRAM NEGATIVE RODS    CULT  10/24/2022    FEW STAPHYLOCOCCUS AUREUS ABUNDANT PSEUDOMONAS AERUGINOSA MODERATE PROTEUS MIRABILIS WITHIN MIXED CULTURE NO ANAEROBES ISOLATED Performed at [facility], [address], [place], [place] [address]    LABORGA PROTEUS MIRABILIS 10/24/2022   LABORGA STAPHYLOCOCCUS AUREUS 10/24/2022   LABORGA PSEUDOMONAS AERUGINOSA 10/24/2022     Lab Results  Component Value Date   ALBUMIN 2.8 (L) 10/22/2022   ALBUMIN 2.9 (L) 07/16/2022   ALBUMIN 2.4 (L) 06/27/2022   PREALBUMIN 12.5 (L) 03/16/2017    Lab Results  Component Value Date   MG 2.1 06/27/2022   MG 1.9 06/25/2022   MG 1.6 (L) 06/13/2022   No results found for: "VD25OH"  Lab Results  Component Value Date   PREALBUMIN 12.5 (L) 03/16/2017      Latest Ref Rng &  Units 10/25/2022   10:58 AM 10/22/2022    5:54 PM 07/16/2022    4:19 PM  CBC EXTENDED  WBC 4.0 - 10.5 K/uL 9.9  9.2  11.1   RBC 3.87 - 5.11 MIL/uL 4.66  4.63  4.51   Hemoglobin 12.0 - 15.0 g/dL 16.1  09.6  04.5   HCT 36.0 - 46.0 % 39.8  39.2  38.6   Platelets 150 - 400 K/uL 413  406  375   NEUT# 1.7 - 7.7 K/uL  6.0    Lymph# 0.7 - 4.0 K/uL  2.1       There is no height or weight on file to calculate BMI.  Orders:  No orders of the defined types were placed in this encounter.  No orders of the defined types were placed in this encounter.    Procedures: No procedures performed  Clinical  Data: No additional findings.  ROS:  All other systems negative, except as noted in the HPI. Review of Systems  Constitutional: Negative.   Cardiovascular:  Positive for leg swelling.  Skin:  Positive for color change and wound.    Objective: Vital Signs: There were no vitals taken for this visit.  Specialty Comments:  No specialty comments available.  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   Wound infection 02/05/2021   Venous stasis dermatitis of both lower extremities    Pneumonia due to COVID-19 virus 06/29/2019   Hypophosphatemia 06/24/2019   Obesity 06/23/2019   Insulin dependent type 2 diabetes mellitus (HCC) 06/22/2019   Anxiety 06/22/2019   Depressive disorder 06/22/2019   Essential hypertension 06/22/2019   HLD (hyperlipidemia) 06/22/2019   COVID-19 06/21/2019   Severe protein-calorie malnutrition (HCC) 06/21/2019   Acute respiratory failure with hypoxia (HCC) 06/21/2019   Diabetic foot ulcer associated with type 2 diabetes mellitus (HCC)    Cellulitis 07/05/2016   Depression with anxiety 05/30/2016   Chronic skin ulcer of lower leg (HCC) 05/30/2016   Generalized weakness 05/30/2016   Left arm weakness 05/30/2016   Left Lower extremity pain  04/02/2016   Lower extremity pain, inferior, left 04/02/2016   Left leg pain    Personal history of noncompliance  with medical treatment, presenting hazards to health 03/31/2016   Jaw pain 03/27/2016   Left leg cellulitis 03/24/2016   Cellulitis of left lower extremity    Primary insomnia    Adjustment disorder    Morbid obesity (HCC) 01/27/2016   Wheelchair dependent 01/27/2016   Gout 01/27/2016   Asthma 01/27/2016   Arthritis 01/27/2016   Uncontrolled type 2 diabetes mellitus with hypoglycemia, with long-term current use of insulin (HCC) 01/25/2016   Cellulitis of left leg 01/11/2016   Insulin-requiring or dependent type II diabetes mellitus (HCC) 01/11/2016   Past Medical History:  Diagnosis Date   Anxiety    Arthritis    "back, arms, legs" (03/24/2016)   Asthma    Chronic lower back pain  Colonic polyp    last colonoscopy done in 2009 with normal results per medical record   Depressive disorder    Gastric polyp    Gout    has taken allopurinol 300mg  once daily in past   Headache    History of hiatal hernia    Hypertension    Migraine    "none in awhile; might have a couple/year" (03/24/2016)   Mixed hyperlipidemia    01/2016 Total chol 141, HDL 59, LDL 63, ration 1.1   Osteoarthritis    TIA (transient ischemic attack) 11/2014   Type II diabetes mellitus (HCC)     Family History  Problem Relation Age of Onset   Stroke Father    Stroke Brother    Cancer Other    Gout Mother    Hypertension Mother    Arthritis Mother    Asthma Mother    Stroke Brother     Past Surgical History:  Procedure Laterality Date   ARTERY BIOPSY Right 08/12/2016   Procedure: BIOPSY TEMPORAL ARTERY;  Surgeon: Sherren Kerns, MD;  Location: [facility] OR;  Service: Vascular;  Laterality: Right;  BIOPSY TEMPORAL ARTERY   BREAST BIOPSY Left ~ 2015   benign   CARPAL TUNNEL RELEASE Bilateral    DILATION AND CURETTAGE OF UTERUS     I & D EXTREMITY Bilateral 10/24/2022   Procedure: DEBRIDEMENT LEFT LEG AND RIGHT ANKLE;  Surgeon: Nadara Mustard, MD;  Location: [facility] OR;  Service: Orthopedics;  Laterality:  Bilateral;   IR CT HEAD LTD  06/11/2022   IR PERCUTANEOUS ART THROMBECTOMY/INFUSION INTRACRANIAL INC DIAG ANGIO  06/11/2022   IR US GUIDE VASC ACCESS RIGHT  06/11/2022   KN[facility]THROSCOPY Right 2003   in [facility]   LAPAROSCOPIC CHOLECYSTECTOMY     LOOP RECORDER INSERTION N/A 06/26/2022   Procedure: LOOP RECORDER INSERTION;  Surgeon: Regan Lemming, MD;  Location: [facility] INVAS[facility];  Service: Cardiovascular;  Laterality: N/A;   RADIOLOGY WITH ANESTHESIA N/A 06/11/2022   Procedure: IR WITH ANESTHESIA;  Surgeon: Radiologist, Medication, MD;  Location: [facility] OR;  Service: Radiology;  Laterality: N/A;   TUBAL LIGATION     VAGINAL HYSTERECTOMY  1982   Social History   Occupational History   Not on file  Tobacco Use   Smoking status: Never   Smokeless tobacco: Never  Vaping Use   Vaping Use: Never used  Substance and Sexual Activity   Alcohol use: No   Drug use: No   Sexual activity: Never

## 2022-12-30 ENCOUNTER — Ambulatory Visit (INDEPENDENT_AMBULATORY_CARE_PROVIDER_SITE_OTHER): Payer: Medicare PPO | Admitting: Orthopedic Surgery

## 2022-12-30 DIAGNOSIS — I87333 Chronic venous hypertension (idiopathic) with ulcer and inflammation of bilateral lower extremity: Secondary | ICD-10-CM | POA: Diagnosis not present

## 2023-01-02 ENCOUNTER — Encounter: Payer: Self-pay | Admitting: Orthopedic Surgery

## 2023-01-02 NOTE — Progress Notes (Signed)
Office Visit Note   Patient: [name]           Date of Birth: 1950-09-01           MRN: [id number] Visit Date: 12/30/2022              Requested by: Salvatore Decent, PA-C [id number] [place],  [place] [address] PCP: Salvatore Decent, PA-C  Chief Complaint  Patient presents with   Left Leg - Routine Post Op     10/24/22 deb of left leg and right ankle   Right Ankle - Routine Post Op      HPI: Chronic venous ulcers both legs  Assessment & Plan: Visit Diagnoses:  1. Chronic venous hypertension (idiopathic) with ulcer and inflammation of bilateral lower extremity (HCC)     Plan: recommend hhrn cleansing and dressing changes at least 3 x week  Follow-Up Instructions: Return in about 2 months (around 03/02/2023).   Ortho Exam  Patient is alert, oriented, no adenopathy, well-dressed, normal affect, normal respiratory effort. Maggots in wounds last week, wounds with maceration and fibrinous tissue today  Imaging: No results found.    Labs: Lab Results  Component Value Date   HGBA1C 8.1 (H) 10/23/2022   HGBA1C 9.5 (H) 06/11/2022   HGBA1C 11.5 (A) 05/21/2021   ESRSEDRATE 90 (H) 10/23/2022   ESRSEDRATE 117 (H) 03/06/2021   ESRSEDRATE 104 (H) 02/05/2021   CRP 3.8 (H) 10/23/2022   CRP 43.7 (H) 03/06/2021   CRP 0.8 06/29/2019   REPTSTATUS 10/29/2022 FINAL 10/24/2022   GRAMSTAIN  10/24/2022    ABUNDANT SQUAMOUS EPITHELIAL CELLS PRESENT FEW WBC PRESENT, PREDOMINANTLY PMN MODERATE GRAM POSITIVE RODS MODERATE GRAM POSITIVE COCCI FEW GRAM NEGATIVE RODS    CULT  10/24/2022    FEW STAPHYLOCOCCUS AUREUS ABUNDANT PSEUDOMONAS AERUGINOSA MODERATE PROTEUS MIRABILIS WITHIN MIXED CULTURE NO ANAEROBES ISOLATED Performed at [facility], [address] [place], [place] [address]    LABORGA PROTEUS MIRABILIS 10/24/2022   LABORGA STAPHYLOCOCCUS AUREUS 10/24/2022   LABORGA PSEUDOMONAS AERUGINOSA 10/24/2022     Lab Results  Component Value Date   ALBUMIN  2.8 (L) 10/22/2022   ALBUMIN 2.9 (L) 07/16/2022   ALBUMIN 2.4 (L) 06/27/2022   PREALBUMIN 12.5 (L) 03/16/2017    Lab Results  Component Value Date   MG 2.1 06/27/2022   MG 1.9 06/25/2022   MG 1.6 (L) 06/13/2022   No results found for: "VD25OH"  Lab Results  Component Value Date   PREALBUMIN 12.5 (L) 03/16/2017      Latest Ref Rng & Units 10/25/2022   10:58 AM 10/22/2022    5:54 PM 07/16/2022    4:19 PM  CBC EXTENDED  WBC 4.0 - 10.5 K/uL 9.9  9.2  11.1   RBC 3.87 - 5.11 MIL/uL 4.66  4.63  4.51   Hemoglobin 12.0 - 15.0 g/dL 57.8  46.9  62.9   HCT 36.0 - 46.0 % 39.8  39.2  38.6   Platelets 150 - 400 K/uL 413  406  375   NEUT# 1.7 - 7.7 K/uL  6.0    Lymph# 0.7 - 4.0 K/uL  2.1       There is no height or weight on file to calculate BMI.  Orders:  No orders of the defined types were placed in this encounter.  No orders of the defined types were placed in this encounter.    Procedures: No procedures performed  Clinical Data: No additional findings.  ROS:  All  other systems negative, except as noted in the HPI. Review of Systems  Objective: Vital Signs: There were no vitals taken for this visit.  Specialty Comments:  No specialty comments available.  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   Wound infection 02/05/2021   Venous stasis dermatitis of both  lower extremities    Pneumonia due to COVID-19 virus 06/29/2019   Hypophosphatemia 06/24/2019   Obesity 06/23/2019   Insulin dependent type 2 diabetes mellitus (HCC) 06/22/2019   Anxiety 06/22/2019   Depressive disorder 06/22/2019   Essential hypertension 06/22/2019   HLD (hyperlipidemia) 06/22/2019   COVID-19 06/21/2019   Severe protein-calorie malnutrition (HCC) 06/21/2019   Acute respiratory failure with hypoxia (HCC) 06/21/2019   Diabetic foot ulcer associated with type 2 diabetes mellitus (HCC)    Cellulitis 07/05/2016   Depression with anxiety 05/30/2016   Chronic skin ulcer of lower leg (HCC) 05/30/2016   Generalized weakness 05/30/2016   Left arm weakness 05/30/2016   Left Lower extremity pain  04/02/2016   Lower extremity pain, inferior, left 04/02/2016   Left leg pain    Personal history of noncompliance with medical treatment, presenting hazards to health 03/31/2016   Jaw pain 03/27/2016   Left leg cellulitis 03/24/2016   Cellulitis of left lower extremity    Primary insomnia    Adjustment disorder    Morbid obesity (HCC) 01/27/2016   Wheelchair dependent 01/27/2016   Gout 01/27/2016   Asthma 01/27/2016   Arthritis 01/27/2016   Uncontrolled type 2 diabetes mellitus with hypoglycemia, with long-term current use of insulin (HCC) 01/25/2016   Cellulitis of left leg 01/11/2016   Insulin-requiring or dependent type II diabetes mellitus (HCC) 01/11/2016   Past Medical History:  Diagnosis Date   Anxiety    Arthritis    "back, arms, legs" (03/24/2016)   Asthma    Chronic lower back pain    Colonic polyp    last colonoscopy done in 2009 with normal results per medical record   Depressive disorder    Gastric polyp    Gout    has taken allopurinol 300mg  once daily in past   Headache    History of hiatal hernia    Hypertension    Migraine    "none in awhile; might have a couple/year" (03/24/2016)   Mixed hyperlipidemia    01/2016 Total chol 141, HDL 59, LDL 63,  ration 1.1   Osteoarthritis    TIA (transient ischemic attack) 11/2014   Type II diabetes mellitus (HCC)     Family History  Problem Relation Age of Onset   Stroke Father    Stroke Brother    Cancer Other    Gout Mother    Hypertension Mother    Arthritis Mother    Asthma Mother    Stroke Brother     Past Surgical History:  Procedure Laterality Date   ARTERY BIOPSY Right 08/12/2016   Procedure: BIOPSY TEMPORAL ARTERY;  Surgeon: Sherren Kerns, MD;  Location: [facility] OR;  Service: Vascular;  Laterality: Right;  BIOPSY TEMPORAL ARTERY   BREAST BIOPSY Left ~ 2015   benign   CARPAL TUNNEL RELEASE Bilateral    DILATION AND CURETTAGE OF UTERUS     I & D EXTREMITY Bilateral 10/24/2022   Procedure: DEBRIDEMENT LEFT LEG AND RIGHT ANKLE;  Surgeon: Nadara Mustard, MD;  Location: [facility] OR;  Service: Orthopedics;  Laterality: Bilateral;   IR CT HEAD LTD  06/11/2022   IR PERCUTANEOUS ART THROMBECTOMY/INFUSION INTRACRANIAL INC DIAG ANGIO  06/11/2022   IR US GUIDE VASC ACCESS RIGHT  06/11/2022   KNEE ARTHROSCOPY Right 2003   in [facility]   LAPAROSCOPIC CHOLECYSTECTOMY     LOOP RECORDER INSERTION N/A 06/26/2022   Procedure: LOOP RECORDER INSERTION;  Surgeon: Regan Lemming, MD;  Location: [facility];  Service: Cardiovascular;  Laterality: N/A;   RADIOLOGY WITH ANESTHESIA N/A 06/11/2022   Procedure: IR WITH ANESTHESIA;  Surgeon: Radiologist, Medication, MD;  Location: [facility] OR;  Service: Radiology;  Laterality: N/A;   TUBAL LIGATION     VAGINAL HYSTERECTOMY  1982   Social History   Occupational History   Not on file  Tobacco Use   Smoking status: Never   Smokeless tobacco: Never  Vaping Use   Vaping status: Never Used  Substance and Sexual Activity   Alcohol use: No   Drug use: No   Sexual activity: Never

## 2023-02-17 ENCOUNTER — Emergency Department (HOSPITAL_COMMUNITY): Payer: Medicare PPO

## 2023-02-17 ENCOUNTER — Inpatient Hospital Stay (HOSPITAL_COMMUNITY)
Admission: EM | Admit: 2023-02-17 | Discharge: 2023-02-19 | DRG: 300 | Disposition: A | Discharge status: Home / Self Care | Payer: Medicare PPO | Attending: Internal Medicine | Admitting: Internal Medicine

## 2023-02-17 DIAGNOSIS — I878 Other specified disorders of veins: Secondary | ICD-10-CM | POA: Diagnosis present

## 2023-02-17 DIAGNOSIS — K436 Other and unspecified ventral hernia with obstruction, without gangrene: Secondary | ICD-10-CM | POA: Diagnosis present

## 2023-02-17 DIAGNOSIS — Z823 Family history of stroke: Secondary | ICD-10-CM

## 2023-02-17 DIAGNOSIS — M545 Low back pain, unspecified: Secondary | ICD-10-CM | POA: Diagnosis present

## 2023-02-17 DIAGNOSIS — F32A Depression, unspecified: Secondary | ICD-10-CM | POA: Diagnosis present

## 2023-02-17 DIAGNOSIS — E1151 Type 2 diabetes mellitus with diabetic peripheral angiopathy without gangrene: Secondary | ICD-10-CM | POA: Diagnosis not present

## 2023-02-17 DIAGNOSIS — M7989 Other specified soft tissue disorders: Secondary | ICD-10-CM | POA: Diagnosis present

## 2023-02-17 DIAGNOSIS — Z79899 Other long term (current) drug therapy: Secondary | ICD-10-CM

## 2023-02-17 DIAGNOSIS — E119 Type 2 diabetes mellitus without complications: Secondary | ICD-10-CM

## 2023-02-17 DIAGNOSIS — R634 Abnormal weight loss: Secondary | ICD-10-CM | POA: Diagnosis present

## 2023-02-17 DIAGNOSIS — J45909 Unspecified asthma, uncomplicated: Secondary | ICD-10-CM | POA: Insufficient documentation

## 2023-02-17 DIAGNOSIS — M170 Bilateral primary osteoarthritis of knee: Secondary | ICD-10-CM | POA: Diagnosis present

## 2023-02-17 DIAGNOSIS — E782 Mixed hyperlipidemia: Secondary | ICD-10-CM | POA: Diagnosis present

## 2023-02-17 DIAGNOSIS — F03A4 Unspecified dementia, mild, with anxiety: Secondary | ICD-10-CM | POA: Diagnosis present

## 2023-02-17 DIAGNOSIS — K439 Ventral hernia without obstruction or gangrene: Secondary | ICD-10-CM | POA: Diagnosis present

## 2023-02-17 DIAGNOSIS — Z90710 Acquired absence of both cervix and uterus: Secondary | ICD-10-CM

## 2023-02-17 DIAGNOSIS — K56600 Partial intestinal obstruction, unspecified as to cause: Secondary | ICD-10-CM | POA: Diagnosis present

## 2023-02-17 DIAGNOSIS — F411 Generalized anxiety disorder: Secondary | ICD-10-CM | POA: Insufficient documentation

## 2023-02-17 DIAGNOSIS — G8929 Other chronic pain: Secondary | ICD-10-CM | POA: Diagnosis present

## 2023-02-17 DIAGNOSIS — R112 Nausea with vomiting, unspecified: Secondary | ICD-10-CM | POA: Diagnosis present

## 2023-02-17 DIAGNOSIS — S81802A Unspecified open wound, left lower leg, initial encounter: Principal | ICD-10-CM

## 2023-02-17 DIAGNOSIS — Z794 Long term (current) use of insulin: Secondary | ICD-10-CM

## 2023-02-17 DIAGNOSIS — I872 Venous insufficiency (chronic) (peripheral): Secondary | ICD-10-CM

## 2023-02-17 DIAGNOSIS — Z7982 Long term (current) use of aspirin: Secondary | ICD-10-CM

## 2023-02-17 DIAGNOSIS — L97918 Non-pressure chronic ulcer of unspecified part of right lower leg with other specified severity: Secondary | ICD-10-CM | POA: Diagnosis present

## 2023-02-17 DIAGNOSIS — D72829 Elevated white blood cell count, unspecified: Secondary | ICD-10-CM | POA: Diagnosis present

## 2023-02-17 DIAGNOSIS — I890 Lymphedema, not elsewhere classified: Secondary | ICD-10-CM

## 2023-02-17 DIAGNOSIS — M109 Gout, unspecified: Secondary | ICD-10-CM | POA: Diagnosis present

## 2023-02-17 DIAGNOSIS — Z825 Family history of asthma and other chronic lower respiratory diseases: Secondary | ICD-10-CM

## 2023-02-17 DIAGNOSIS — Z8249 Family history of ischemic heart disease and other diseases of the circulatory system: Secondary | ICD-10-CM

## 2023-02-17 DIAGNOSIS — F319 Bipolar disorder, unspecified: Secondary | ICD-10-CM | POA: Diagnosis present

## 2023-02-17 DIAGNOSIS — E872 Acidosis, unspecified: Secondary | ICD-10-CM | POA: Diagnosis present

## 2023-02-17 DIAGNOSIS — Z8673 Personal history of transient ischemic attack (TIA), and cerebral infarction without residual deficits: Secondary | ICD-10-CM

## 2023-02-17 DIAGNOSIS — F03A3 Unspecified dementia, mild, with mood disturbance: Secondary | ICD-10-CM | POA: Diagnosis present

## 2023-02-17 DIAGNOSIS — S81809A Unspecified open wound, unspecified lower leg, initial encounter: Secondary | ICD-10-CM | POA: Diagnosis present

## 2023-02-17 DIAGNOSIS — L97929 Non-pressure chronic ulcer of unspecified part of left lower leg with unspecified severity: Secondary | ICD-10-CM | POA: Diagnosis present

## 2023-02-17 DIAGNOSIS — E785 Hyperlipidemia, unspecified: Secondary | ICD-10-CM | POA: Diagnosis present

## 2023-02-17 DIAGNOSIS — Z91040 Latex allergy status: Secondary | ICD-10-CM

## 2023-02-17 DIAGNOSIS — Z888 Allergy status to other drugs, medicaments and biological substances status: Secondary | ICD-10-CM

## 2023-02-17 DIAGNOSIS — Z8261 Family history of arthritis: Secondary | ICD-10-CM

## 2023-02-17 DIAGNOSIS — K56609 Unspecified intestinal obstruction, unspecified as to partial versus complete obstruction: Secondary | ICD-10-CM | POA: Insufficient documentation

## 2023-02-17 DIAGNOSIS — L97909 Non-pressure chronic ulcer of unspecified part of unspecified lower leg with unspecified severity: Secondary | ICD-10-CM | POA: Diagnosis present

## 2023-02-17 DIAGNOSIS — E11622 Type 2 diabetes mellitus with other skin ulcer: Secondary | ICD-10-CM | POA: Diagnosis present

## 2023-02-17 DIAGNOSIS — F419 Anxiety disorder, unspecified: Secondary | ICD-10-CM | POA: Diagnosis present

## 2023-02-17 DIAGNOSIS — Z6841 Body Mass Index (BMI) 40.0 and over, adult: Secondary | ICD-10-CM

## 2023-02-17 DIAGNOSIS — I10 Essential (primary) hypertension: Secondary | ICD-10-CM | POA: Diagnosis present

## 2023-02-17 DIAGNOSIS — Z86010 Personal history of colonic polyps: Secondary | ICD-10-CM

## 2023-02-17 LAB — COMPREHENSIVE METABOLIC PANEL
ALT: 11 U/L (ref 0–44)
AST: 13 U/L — ABNORMAL LOW (ref 15–41)
Albumin: 2.7 g/dL — ABNORMAL LOW (ref 3.5–5.0)
Alkaline Phosphatase: 92 U/L (ref 38–126)
Anion gap: 15 (ref 5–15)
BUN: 7 mg/dL — ABNORMAL LOW (ref 8–23)
CO2: 19 mmol/L — ABNORMAL LOW (ref 22–32)
Calcium: 9.6 mg/dL (ref 8.9–10.3)
Chloride: 104 mmol/L (ref 98–111)
Creatinine, Ser: 0.83 mg/dL (ref 0.44–1.00)
GFR, Estimated: >60 mL/min (ref >60)
Glucose, Bld: 177 mg/dL — ABNORMAL HIGH (ref 70–99)
Potassium: 3.6 mmol/L (ref 3.5–5.1)
Sodium: 138 mmol/L (ref 135–145)
Total Bilirubin: 0.7 mg/dL (ref 0.3–1.2)
Total Protein: 8.5 g/dL — ABNORMAL HIGH (ref 6.5–8.1)

## 2023-02-17 LAB — CBC WITH DIFFERENTIAL/PLATELET
Abs Immature Granulocytes: 0.03 10*3/uL (ref 0.00–0.07)
Basophils Absolute: 0 10*3/uL (ref 0.0–0.1)
Basophils Relative: 0 %
Eosinophils Absolute: 0 10*3/uL (ref 0.0–0.5)
Eosinophils Relative: 0 %
HCT: 35.9 % — ABNORMAL LOW (ref 36.0–46.0)
Hemoglobin: 11.2 g/dL — ABNORMAL LOW (ref 12.0–15.0)
Immature Granulocytes: 0 %
Lymphocytes Relative: 7 %
Lymphs Abs: 0.7 10*3/uL (ref 0.7–4.0)
MCH: 27.1 pg (ref 26.0–34.0)
MCHC: 31.2 g/dL (ref 30.0–36.0)
MCV: 86.7 fL (ref 80.0–100.0)
Monocytes Absolute: 0.4 10*3/uL (ref 0.1–1.0)
Monocytes Relative: 4 %
Neutro Abs: 9.6 10*3/uL — ABNORMAL HIGH (ref 1.7–7.7)
Neutrophils Relative %: 89 %
Platelets: 336 10*3/uL (ref 150–400)
RBC: 4.14 MIL/uL (ref 3.87–5.11)
RDW: 16.3 % — ABNORMAL HIGH (ref 11.5–15.5)
WBC: 10.8 10*3/uL — ABNORMAL HIGH (ref 4.0–10.5)
nRBC: 0 % (ref 0.0–0.2)

## 2023-02-17 LAB — LIPASE, BLOOD: Lipase: 23 U/L (ref 11–51)

## 2023-02-17 MED ORDER — PIPERACILLIN-TAZOBACTAM 3.375 G IVPB 30 MIN
3.3750 g | Freq: Once | INTRAVENOUS | Status: AC
  Administered 2023-02-17: 3.375 g via INTRAVENOUS
  Filled 2023-02-17: qty 50

## 2023-02-17 MED ORDER — ONDANSETRON HCL 4 MG/2ML IJ SOLN
4.0000 mg | Freq: Once | INTRAMUSCULAR | Status: AC
  Administered 2023-02-17: 4 mg via INTRAVENOUS
  Filled 2023-02-17: qty 2

## 2023-02-17 MED ORDER — SODIUM CHLORIDE 0.9 % IV BOLUS
1000.0000 mL | Freq: Once | INTRAVENOUS | Status: AC
  Administered 2023-02-17: 1000 mL via INTRAVENOUS

## 2023-02-17 MED ORDER — IOHEXOL 350 MG/ML SOLN
75.0000 mL | Freq: Once | INTRAVENOUS | Status: AC | PRN
  Administered 2023-02-17: 75 mL via INTRAVENOUS

## 2023-02-17 NOTE — ED Notes (Signed)
Assumed care of patient brought to room via wc. Family concerned as she was not acting herself this evening and her bs was 180. Family also reports she complained of abdominal pain and nausea but denies emesis diarrhea and constipation. Patient also with foul smelling wounds to bilateral feet and legs. Per family pt is homeless living at an extended stay hotel . Patient is a/o x 4 respirations even and non labored vs wnl

## 2023-02-17 NOTE — ED Triage Notes (Signed)
Patient BIB [facility] from an extended stay for nausea, dry heaving, and stomach pain starting today as well as "not responding right" for an hour per family to EMS. Family was concerned about the patient's sugar which was 180 with EMS. EMS also reports gangrenous bilateral feet. VSS, patient comfortable and resting at this time

## 2023-02-17 NOTE — ED Provider Notes (Signed)
[place] [facility]MENT AT [facility] Provider Note   CSN: [id number] Arrival date & time: 02/17/23  1957     History No chief complaint on file.   [name] is a 72 y.o. female with past medical history of hypertension, hyperlipidemia, history of stroke, insulin-dependent type 2 diabetes, anxiety, depression, bipolar disorder, osteoarthritis, asthma, chronic low back pain, gout, mild dementia, chronic bilateral lower extremity wounds, venous stasis ulcers, morbid obesity presented to ED for concerns of nausea and vomiting and abdominal pain, decrease in appetite.  The history is provided by the patient and a caregiver (Son). No language interpreter was used.   Patient's son, Matisse Boissonneault at bedside and Husband, Annajean Patruno at bedside.   Son at bedside reports concerns of decrease in appetite for the past 2 months, last three weeks of no solids and very little liquid.  Son reports 70 pound weight loss in the past couple of months, patient reports 40 pound weight loss in the last month.  Son reports he bought her protein shakes that she is not drinking. Patient is not eating anything for breakfast.  She tries to eat during lunch however she is unable to eat solid, reports she spits it back out.  Denies any trouble swallowing.  She reports drinking less liquid, reports drinking less than half a cup a day of water. Patient reports she does not feel like eating, reports "nothing tastes good". Son reports nausea and vomiting that started this morning, reports 6-10 episodes of non bloody emesis, mostly yellow in color. Reports left abdominal pain that started this morning, radiates to lower abdomen, describes it as cramping pain. Son reports getting her mylanta because she felt bloated. No passing flatus. Last BM per patient is over a week ago, she does not recall the last time she had one.  Otherwise the patient denies any symptoms of fever, chills, headache, blurry vision,  recent sickness, sick contacts, sore throat, cough, congestion, CP, SOB. She uses wheelchair for ambulation.  Son at bedside reports that patient or her husband are changing the dressings on her bilaterally LE wounds. Wound care nurse last visited about 6 weeks ago.   Patient's son is also concerned about her changing in mental status today. Son reports pt was calling her for mom and yelling "help me" as she was looking at her son. Son reports that was very "out of character for her". Patient reports that she usually calls for her mother, who is alive, when no one is around. Upon exam, patient is alert and orinted to self, husband, son, DOB, location, year, event. She is able to do simple math, calculating quarter, nickle and dime as 40 cents. Patient reports feeling down and depressed lately. She misses her grand daughters. She has thoughts of " I do not want to be here anymore" lately, no thougths of hurting herself or others.   Cassandr Hauri  [phone]  Home Medications Prior to Admission medications   Medication Sig Start Date End Date Taking? Authorizing Provider  acetaminophen (TYLENOL) 325 MG tablet Take 2 tablets (650 mg total) by mouth every 6 (six) hours as needed for mild pain (or Fever >/= 101). 06/28/22   Elgergawy, Leana Roe, MD  aspirin EC 81 MG tablet Take 81 mg by mouth daily. Swallow whole.    [provider]  atenolol (TENORMIN) 25 MG tablet Take 25-50 mg by mouth See admin instructions. 50 mg every morning, 25 mg at bedtime    [provider]  cyclobenzaprine (FLEXERIL) 5 MG tablet Take 5 mg by mouth 3 (three) times daily as needed for muscle spasms. 06/03/22   [provider]  donepezil (ARICEPT) 10 MG tablet Take 10 mg by mouth in the morning.    [provider]  gabapentin (NEURONTIN) 300 MG capsule Take 1 capsule (300 mg total) by mouth 2 (two) times daily. 10/29/22   Sheikh, Kateri Mc [name], DO  insulin glargine (LANTUS) 100 UNIT/ML Solostar Pen  Inject 30 Units into the skin every morning. And pen needles 1/day 10/29/22   Marguerita Merles [name], DO  leptospermum manuka honey (MEDIHONEY) PSTE paste Apply 1 Application topically daily. 10/30/22   Marguerita Merles [name], DO  lurasidone (LATUDA) 40 MG TABS tablet Take 1 tablet (40 mg total) by mouth daily with breakfast. 06/16/22   Bobbye Morton, MD  mineral oil-hydrophilic petrolatum (AQUAPHOR) ointment Apply topically daily. 10/30/22   Marguerita Merles [name], DO  mupirocin ointment (BACTROBAN) 2 % Place 1 Application into the nose 2 (two) times daily. 10/29/22   Marguerita Merles [name], DO  NIFEdipine (PROCARDIA XL/NIFEDICAL-XL) 90 MG 24 hr tablet Take 90 mg by mouth in the morning.    [provider]  ondansetron (ZOFRAN) 4 MG tablet Take 1 tablet (4 mg total) by mouth every 6 (six) hours. 07/16/22   Prosperi, Christian H, PA-C  ondansetron (ZOFRAN) 4 MG tablet Take 1 tablet (4 mg total) by mouth every 6 (six) hours as needed for nausea. 10/29/22   Marguerita Merles [name], DO  oxyCODONE-acetaminophen (PERCOCET/ROXICET) 5-325 MG tablet Take 1 tablet by mouth every 6 (six) hours as needed. 12/19/22   Adonis Huguenin, NP  polyethylene glycol powder (GLYCOLAX/MIRALAX) 17 GM/SCOOP powder Take 17 g by mouth daily as needed for mild constipation. 10/29/22   Marguerita Merles [name], DO  rosuvastatin (CRESTOR) 10 MG tablet Take 1 tablet (10 mg total) by mouth daily. 10/30/22   Marguerita Merles [name], DO      Allergies    Ace inhibitors, Latex, Tizanidine, Ultram [tramadol], Diprivan [propofol], and Metformin and related    Review of Systems   Review of Systems as per HPI  Physical Exam Updated Vital Signs BP 139/78   Pulse 82   Temp 98.4 F (36.9 C) (Oral)   Resp 16   SpO2 98%  Physical Exam  Constitutional: Appears fatigued, no acute distress. HENT: Atraumatic, normocephalic, mucous membranes moist. Cardiovascular: Regular rate, no murmurs rubs or gallops. Pulmonary: lungs clear to auscultation bilaterally, no  wheezing or rales Abdomen: Soft, nontender, nondistended.  Bowel sounds are present. MSK: Chronic venous stasis of bilateral lower extremities with ulcers. Ulcer on the anterior aspect of the right lower leg with purulent discharge.  Left lower leg discolorations and maggots.  Neuro: Alert and oriented to self, husband, son, date of birth, year, location, event.  PERRL, EOMI, CN II to XII intact. Sensation intact bilaterally in the upper and lower extremities.  5 out of 5 grip strength.  5 out of 5 strength in the upper and lower extremities, Bilaterally. Mood: Flat mood and affect.  Right Lower Leg with ulceration    Right Lower Leg   Left lower leg with maggots    ED Results / Procedures / Treatments   Labs (all labs ordered are listed, but only abnormal results are displayed) Labs Reviewed  CBC WITH DIFFERENTIAL/PLATELET - Abnormal; Notable for the following components:      Result Value   WBC 10.8 (*)    Hemoglobin 11.2 (*)  HCT 35.9 (*)    RDW 16.3 (*)    Neutro Abs 9.6 (*)    All other components within normal limits  COMPREHENSIVE METABOLIC PANEL - Abnormal; Notable for the following components:   CO2 19 (*)    Glucose, Bld 177 (*)    BUN 7 (*)    Total Protein 8.5 (*)    Albumin 2.7 (*)    AST 13 (*)    All other components within normal limits  CULTURE, BLOOD (ROUTINE X 2)  CULTURE, BLOOD (ROUTINE X 2)  LIPASE, BLOOD  I-STAT CG4 LACTIC ACID, ED    EKG EKG Interpretation Date/Time:  Tuesday February 17 2023 21:16:56 EDT Ventricular Rate:  84 PR Interval:  151 QRS Duration:  89 QT Interval:  385 QTC Calculation: 456 R Axis:   -13  Text Interpretation: Sinus rhythm Low voltage, precordial leads Left ventricular hypertrophy No significant change since last tracing Confirmed by Richardean Canal [phone]) on 02/17/2023 9:34:00 PM  Radiology DG Chest Port 1 View  Result Date: 02/17/2023 CLINICAL DATA:  Altered mental status, nausea, and abdominal pain. Suspected  bilateral foreleg cellulitis. [id number]. EXAM: PORTABLE CHEST 1 VIEW PORTABLE RIGHT TIBIA AND FIBULA - 2 VIEW PORTABLE LEFT TIBIA AND FIBULA - 2 VIEW COMPARISON:  PA and lateral chest 07/16/2022, left tibia fibula series 07/20/2019, right tibia fibula series 03/16/2017 FINDINGS: CHEST AP PORTABLE 9:10 P.M.: Loop recorder device left chest wall and multiple overlying monitor wires. The heart is enlarged but unchanged. No vascular congestion is seen. No substantial pleural effusion. The lungs are clear. Degenerative change and slight dextroscoliosis thoracic spine with no new osseous findings. AP LAT RIGHT TIBIA FIBULA: There is mild generalized edema. The edema was greater in 2018 than now. Interval increased multifocal soft tissue calcifications are noted most likely related to venous stasis. There is mild osteopenia with no evidence of fractures or acute osteomyelitis. There is apparent femorotibial degenerative arthrosis. Mild arthrosis of the ankle. AP LATERAL LEFT TIBIA FIBULA: Mild-to-moderate generalized edema. There are increased multifocal soft tissue calcifications most likely due to venous stasis. Irregularity along the mid pretibial skin surface could indicate areas of ulceration. This is also seen posteriorly in the mid foreleg. No foreign body or soft tissue gas are identified. Underlying bones are mildly osteopenic but intact. There is advanced femorotibial degenerative arthrosis with mild arthrosis of the ankle. Small plantar and dorsal calcaneal spurs. IMPRESSION: 1. No evidence of acute chest disease. 2. Cardiomegaly. 3. Interval increased multifocal soft tissue calcifications both forelegs most likely due to venous stasis. 4. Left-greater-than-right soft tissue edema in the forelegs. 5. Irregularity along the left mid pretibial and posterior skin surface which could indicate areas of ulceration. 6. No evidence of fractures or acute osteomyelitis in either foreleg. 7. Advanced arthrosis of both knees.  Electronically Signed   By: Almira Bar M.D.   On: 02/17/2023 22:37   DG Tibia/Fibula Left Port  Result Date: 02/17/2023 CLINICAL DATA:  Altered mental status, nausea, and abdominal pain. Suspected bilateral foreleg cellulitis. [id number]. EXAM: PORTABLE CHEST 1 VIEW PORTABLE RIGHT TIBIA AND FIBULA - 2 VIEW PORTABLE LEFT TIBIA AND FIBULA - 2 VIEW COMPARISON:  PA and lateral chest 07/16/2022, left tibia fibula series 07/20/2019, right tibia fibula series 03/16/2017 FINDINGS: CHEST AP PORTABLE 9:10 P.M.: Loop recorder device left chest wall and multiple overlying monitor wires. The heart is enlarged but unchanged. No vascular congestion is seen. No substantial pleural effusion. The lungs are clear. Degenerative change and slight dextroscoliosis thoracic  spine with no new osseous findings. AP LAT RIGHT TIBIA FIBULA: There is mild generalized edema. The edema was greater in 2018 than now. Interval increased multifocal soft tissue calcifications are noted most likely related to venous stasis. There is mild osteopenia with no evidence of fractures or acute osteomyelitis. There is apparent femorotibial degenerative arthrosis. Mild arthrosis of the ankle. AP LATERAL LEFT TIBIA FIBULA: Mild-to-moderate generalized edema. There are increased multifocal soft tissue calcifications most likely due to venous stasis. Irregularity along the mid pretibial skin surface could indicate areas of ulceration. This is also seen posteriorly in the mid foreleg. No foreign body or soft tissue gas are identified. Underlying bones are mildly osteopenic but intact. There is advanced femorotibial degenerative arthrosis with mild arthrosis of the ankle. Small plantar and dorsal calcaneal spurs. IMPRESSION: 1. No evidence of acute chest disease. 2. Cardiomegaly. 3. Interval increased multifocal soft tissue calcifications both forelegs most likely due to venous stasis. 4. Left-greater-than-right soft tissue edema in the forelegs. 5. Irregularity  along the left mid pretibial and posterior skin surface which could indicate areas of ulceration. 6. No evidence of fractures or acute osteomyelitis in either foreleg. 7. Advanced arthrosis of both knees. Electronically Signed   By: Almira Bar M.D.   On: 02/17/2023 22:37   DG Tibia/Fibula Right Port  Result Date: 02/17/2023 CLINICAL DATA:  Altered mental status, nausea, and abdominal pain. Suspected bilateral foreleg cellulitis. [id number]. EXAM: PORTABLE CHEST 1 VIEW PORTABLE RIGHT TIBIA AND FIBULA - 2 VIEW PORTABLE LEFT TIBIA AND FIBULA - 2 VIEW COMPARISON:  PA and lateral chest 07/16/2022, left tibia fibula series 07/20/2019, right tibia fibula series 03/16/2017 FINDINGS: CHEST AP PORTABLE 9:10 P.M.: Loop recorder device left chest wall and multiple overlying monitor wires. The heart is enlarged but unchanged. No vascular congestion is seen. No substantial pleural effusion. The lungs are clear. Degenerative change and slight dextroscoliosis thoracic spine with no new osseous findings. AP LAT RIGHT TIBIA FIBULA: There is mild generalized edema. The edema was greater in 2018 than now. Interval increased multifocal soft tissue calcifications are noted most likely related to venous stasis. There is mild osteopenia with no evidence of fractures or acute osteomyelitis. There is apparent femorotibial degenerative arthrosis. Mild arthrosis of the ankle. AP LATERAL LEFT TIBIA FIBULA: Mild-to-moderate generalized edema. There are increased multifocal soft tissue calcifications most likely due to venous stasis. Irregularity along the mid pretibial skin surface could indicate areas of ulceration. This is also seen posteriorly in the mid foreleg. No foreign body or soft tissue gas are identified. Underlying bones are mildly osteopenic but intact. There is advanced femorotibial degenerative arthrosis with mild arthrosis of the ankle. Small plantar and dorsal calcaneal spurs. IMPRESSION: 1. No evidence of acute chest disease.  2. Cardiomegaly. 3. Interval increased multifocal soft tissue calcifications both forelegs most likely due to venous stasis. 4. Left-greater-than-right soft tissue edema in the forelegs. 5. Irregularity along the left mid pretibial and posterior skin surface which could indicate areas of ulceration. 6. No evidence of fractures or acute osteomyelitis in either foreleg. 7. Advanced arthrosis of both knees. Electronically Signed   By: Almira Bar M.D.   On: 02/17/2023 22:37    Procedures Procedures    Medications Ordered in ED Medications  sodium chloride 0.9 % bolus 1,000 mL (1,000 mLs Intravenous New Bag/Given 02/17/23 2146)  ondansetron (ZOFRAN) injection 4 mg (4 mg Intravenous Given 02/17/23 2128)  piperacillin-tazobactam (ZOSYN) IVPB 3.375 g (0 g Intravenous Stopped 02/17/23 2224)    ED Course/  Medical Decision Making/ A&P   {                               Medical Decision Making Amount and/or Complexity of Data Reviewed Labs: ordered. Radiology: ordered. ECG/medicine tests: ordered.  Risk Prescription drug management.    This patient presents to the ED for concern of nausea, vomiting, abdominal pain, decreased appetite, weight loss. This involves an extensive number of treatment options, and is a complaint that carries with it a high risk of complications and morbidity.  The differential diagnosis includes physical deconditioning, gastritis, enterocolitis, pancreatitis, osteomyelitis, cellulitis, and necrotizing fasciitis.    Co morbidities that complicate the patient evaluation  Peripheral vascular disease, chronic venous and lymphatic ulcers, stasis ulcer of lower extremity, type 2 diabetes on insulin, hyperlipidemia, hypertension, osteoarthritis.   Additional history obtained:  Additional history obtained from son and husband. External records from outside source obtained and reviewed including none   Lab Tests:  I Ordered, and personally interpreted labs.  The  pertinent results include: CMP remarkable for bicarb 19, BUN 7, Albumin 2.7, AST 13. Gluc 177. Cr normal. CBC with mild leukocytosis WBC 10.8, Hgb 11.2. PLT normal. Blood cultures pending. Lipase 23.    Imaging Studies ordered:  I ordered imaging studies including CXR, Xray left tibia/fibula, CT abdomen pelvis, CT head.  I independently visualized and interpreted imaging which showed no necrotizing fascitis or abscess, no osteomyelitis.   I agree with the radiologist interpretation as follows:  DG Tibia/Fibula Left port/ CXR/ Right Tibia/Fib   IMPRESSION: 1. No evidence of acute chest disease. 2. Cardiomegaly. 3. Interval increased multifocal soft tissue calcifications both forelegs most likely due to venous stasis. 4. Left-greater-than-right soft tissue edema in the forelegs. 5. Irregularity along the left mid pretibial and posterior skin surface which could indicate areas of ulceration. 6. No evidence of fractures or acute osteomyelitis in either foreleg. 7. Advanced arthrosis of both knees.   Cardiac Monitoring: / EKG:  The patient was maintained on a cardiac monitor.  I personally viewed and interpreted the cardiac monitored which showed an underlying rhythm of Regular rate and rhythm, normal PR interval, normal QRS, mild QTC prolongation of 456 ms. Otherwise, low voltage.    Consultations Obtained:  I requested consultation with the Orthopedics, and discussed lab and imaging findings as well as pertinent plan - they will follow up in the morning.    Problem List / ED Course / Critical interventions / Medication management  Chronic Venous Stasis, bilaterally Right anterior Grade II ulceration with pus drainage  Soft tissue edema L > R AMS  I ordered medication including IV Zosyn for Right Grade 2 anterior LE ulceration and Left skin discoloration with maggots.    Reevaluation of the patient after these medicines showed that the patient stayed the same I have reviewed the  patients home medicines and have made adjustments as needed   Social Determinants of Health:  Patient lives alone with her husband.     Test / Admission - Considered:  Patient is a 72 y.o Female with a pertinent past medical history of insulin-dependent type 2 diabetes, chronic bilateral lower extremity wounds, venous stasis ulcers, chronic venous insufficiency who presented to ED for nausea, vomiting, abdominal pain, weight loss, decrease in appetite. Labs showed mild leukocytosis, anemia, nonanion gap metabolic acidosis, normal lipase. Imaging of bilateral lower extremities negative for osteomyelitis, showed L>R soft tissue edema. Son noted concerns of  AMS today, CT head pending to r.o any acute infarcts or acute intracranial abnormality. Concerns for nausea, vomiting, abdominal pain, decreased appetite, no bowel movement in the past week.  CT abdomen pelvis pending for any acute intra-abdominal abnormalities. Patient is started with IV zosyn and given 1 L NS bolus. Patient will be admitted to inpatient services. Orthopedics consulted who will follow up AM.   Final Clinical Impression(s) / ED Diagnoses Final diagnoses:  None    Rx / DC Orders ED Discharge Orders     None         Jeral Pinch, DO 02/17/23 2323    Charlynne Pander, MD 02/17/23 [phone]

## 2023-02-18 ENCOUNTER — Encounter (HOSPITAL_COMMUNITY): Payer: Self-pay

## 2023-02-18 ENCOUNTER — Other Ambulatory Visit: Payer: Self-pay

## 2023-02-18 DIAGNOSIS — L97929 Non-pressure chronic ulcer of unspecified part of left lower leg with unspecified severity: Secondary | ICD-10-CM | POA: Diagnosis present

## 2023-02-18 DIAGNOSIS — K436 Other and unspecified ventral hernia with obstruction, without gangrene: Secondary | ICD-10-CM | POA: Diagnosis present

## 2023-02-18 DIAGNOSIS — Z794 Long term (current) use of insulin: Secondary | ICD-10-CM

## 2023-02-18 DIAGNOSIS — F411 Generalized anxiety disorder: Secondary | ICD-10-CM

## 2023-02-18 DIAGNOSIS — J45909 Unspecified asthma, uncomplicated: Secondary | ICD-10-CM | POA: Diagnosis present

## 2023-02-18 DIAGNOSIS — Z79899 Other long term (current) drug therapy: Secondary | ICD-10-CM | POA: Diagnosis not present

## 2023-02-18 DIAGNOSIS — E7849 Other hyperlipidemia: Secondary | ICD-10-CM

## 2023-02-18 DIAGNOSIS — L97902 Non-pressure chronic ulcer of unspecified part of unspecified lower leg with fat layer exposed: Secondary | ICD-10-CM | POA: Diagnosis not present

## 2023-02-18 DIAGNOSIS — M1000 Idiopathic gout, unspecified site: Secondary | ICD-10-CM | POA: Diagnosis not present

## 2023-02-18 DIAGNOSIS — E872 Acidosis, unspecified: Secondary | ICD-10-CM | POA: Diagnosis present

## 2023-02-18 DIAGNOSIS — R634 Abnormal weight loss: Secondary | ICD-10-CM | POA: Diagnosis present

## 2023-02-18 DIAGNOSIS — I890 Lymphedema, not elsewhere classified: Secondary | ICD-10-CM | POA: Diagnosis present

## 2023-02-18 DIAGNOSIS — F319 Bipolar disorder, unspecified: Secondary | ICD-10-CM | POA: Diagnosis present

## 2023-02-18 DIAGNOSIS — E119 Type 2 diabetes mellitus without complications: Secondary | ICD-10-CM

## 2023-02-18 DIAGNOSIS — Z6841 Body Mass Index (BMI) 40.0 and over, adult: Secondary | ICD-10-CM | POA: Diagnosis not present

## 2023-02-18 DIAGNOSIS — L98499 Non-pressure chronic ulcer of skin of other sites with unspecified severity: Secondary | ICD-10-CM | POA: Diagnosis not present

## 2023-02-18 DIAGNOSIS — Z888 Allergy status to other drugs, medicaments and biological substances status: Secondary | ICD-10-CM | POA: Diagnosis not present

## 2023-02-18 DIAGNOSIS — K56600 Partial intestinal obstruction, unspecified as to cause: Secondary | ICD-10-CM | POA: Diagnosis present

## 2023-02-18 DIAGNOSIS — I872 Venous insufficiency (chronic) (peripheral): Secondary | ICD-10-CM | POA: Diagnosis present

## 2023-02-18 DIAGNOSIS — F3289 Other specified depressive episodes: Secondary | ICD-10-CM

## 2023-02-18 DIAGNOSIS — Z8673 Personal history of transient ischemic attack (TIA), and cerebral infarction without residual deficits: Secondary | ICD-10-CM | POA: Diagnosis not present

## 2023-02-18 DIAGNOSIS — D72829 Elevated white blood cell count, unspecified: Secondary | ICD-10-CM | POA: Diagnosis present

## 2023-02-18 DIAGNOSIS — J4520 Mild intermittent asthma, uncomplicated: Secondary | ICD-10-CM

## 2023-02-18 DIAGNOSIS — L97918 Non-pressure chronic ulcer of unspecified part of right lower leg with other specified severity: Secondary | ICD-10-CM | POA: Diagnosis present

## 2023-02-18 DIAGNOSIS — E1151 Type 2 diabetes mellitus with diabetic peripheral angiopathy without gangrene: Secondary | ICD-10-CM | POA: Diagnosis present

## 2023-02-18 DIAGNOSIS — I10 Essential (primary) hypertension: Secondary | ICD-10-CM

## 2023-02-18 DIAGNOSIS — E11622 Type 2 diabetes mellitus with other skin ulcer: Secondary | ICD-10-CM | POA: Diagnosis present

## 2023-02-18 DIAGNOSIS — F03A3 Unspecified dementia, mild, with mood disturbance: Secondary | ICD-10-CM | POA: Diagnosis present

## 2023-02-18 DIAGNOSIS — F03A4 Unspecified dementia, mild, with anxiety: Secondary | ICD-10-CM | POA: Diagnosis present

## 2023-02-18 DIAGNOSIS — S81802A Unspecified open wound, left lower leg, initial encounter: Secondary | ICD-10-CM | POA: Diagnosis present

## 2023-02-18 DIAGNOSIS — K56609 Unspecified intestinal obstruction, unspecified as to partial versus complete obstruction: Secondary | ICD-10-CM

## 2023-02-18 DIAGNOSIS — E782 Mixed hyperlipidemia: Secondary | ICD-10-CM | POA: Diagnosis present

## 2023-02-18 DIAGNOSIS — M109 Gout, unspecified: Secondary | ICD-10-CM | POA: Diagnosis present

## 2023-02-18 LAB — GLUCOSE, CAPILLARY
Glucose-Capillary: 127 mg/dL — ABNORMAL HIGH (ref 70–99)
Glucose-Capillary: 108 mg/dL — ABNORMAL HIGH (ref 70–99)
Glucose-Capillary: 131 mg/dL — ABNORMAL HIGH (ref 70–99)
Glucose-Capillary: 153 mg/dL — ABNORMAL HIGH (ref 70–99)

## 2023-02-18 LAB — CBG MONITORING, ED: Glucose-Capillary: 98 mg/dL (ref 70–99)

## 2023-02-18 LAB — I-STAT CG4 LACTIC ACID, ED: Lactic Acid, Venous: 1.3 mmol/L (ref 0.5–1.9)

## 2023-02-18 MED ORDER — HYDROCODONE-ACETAMINOPHEN 10-325 MG PO TABS: 1.0000 | ORAL_TABLET | Freq: Two times a day (BID) | ORAL | Status: DC | PRN

## 2023-02-18 MED ORDER — ONDANSETRON HCL 4 MG PO TABS: 4.0000 mg | ORAL_TABLET | Freq: Four times a day (QID) | ORAL | Status: DC | PRN

## 2023-02-18 MED ORDER — ATENOLOL 25 MG PO TABS: 25.0000 mg | ORAL_TABLET | ORAL | Status: DC

## 2023-02-18 MED ORDER — SODIUM CHLORIDE 0.9% FLUSH: 3.0000 mL | INTRAVENOUS | Status: DC | PRN

## 2023-02-18 MED ORDER — ATORVASTATIN CALCIUM 10 MG PO TABS
20.0000 mg | ORAL_TABLET | Freq: Every day | ORAL | Status: DC
  Administered 2023-02-18 – 2023-02-19 (×2): 20 mg via ORAL
  Filled 2023-02-18 (×2): qty 2

## 2023-02-18 MED ORDER — ATORVASTATIN CALCIUM 10 MG PO TABS: 20.0000 mg | ORAL_TABLET | Freq: Every day | ORAL | Status: DC

## 2023-02-18 MED ORDER — DONEPEZIL HCL 10 MG PO TABS: 10.0000 mg | ORAL_TABLET | Freq: Every day | ORAL | Status: DC

## 2023-02-18 MED ORDER — SODIUM CHLORIDE 0.9 % IV SOLN: 250.0000 mL | INTRAVENOUS | Status: DC | PRN

## 2023-02-18 MED ORDER — SODIUM CHLORIDE 0.9% FLUSH
3.0000 mL | Freq: Two times a day (BID) | INTRAVENOUS | Status: DC
  Administered 2023-02-18: 3 mL via INTRAVENOUS

## 2023-02-18 MED ORDER — ALBUTEROL SULFATE (2.5 MG/3ML) 0.083% IN NEBU: 2.5000 mg | INHALATION_SOLUTION | Freq: Four times a day (QID) | RESPIRATORY_TRACT | Status: DC | PRN

## 2023-02-18 MED ORDER — LURASIDONE HCL 40 MG PO TABS: 40.0000 mg | ORAL_TABLET | Freq: Every day | ORAL | Status: DC

## 2023-02-18 MED ORDER — ATENOLOL 25 MG PO TABS
50.0000 mg | ORAL_TABLET | Freq: Every day | ORAL | Status: DC
  Administered 2023-02-19: 50 mg via ORAL
  Filled 2023-02-18: qty 2

## 2023-02-18 MED ORDER — ATENOLOL 25 MG PO TABS
25.0000 mg | ORAL_TABLET | Freq: Every day | ORAL | Status: DC
  Administered 2023-02-18: 25 mg via ORAL
  Filled 2023-02-18: qty 1

## 2023-02-18 MED ORDER — HYDRALAZINE HCL 20 MG/ML IJ SOLN: 10.0000 mg | Freq: Four times a day (QID) | INTRAMUSCULAR | Status: DC | PRN

## 2023-02-18 MED ORDER — VANCOMYCIN HCL 750 MG/150ML IV SOLN
750.0000 mg | Freq: Two times a day (BID) | INTRAVENOUS | Status: DC
  Administered 2023-02-18 – 2023-02-19 (×3): 750 mg via INTRAVENOUS
  Filled 2023-02-18 (×3): qty 150

## 2023-02-18 MED ORDER — ONDANSETRON HCL 4 MG/2ML IJ SOLN: 4.0000 mg | Freq: Four times a day (QID) | INTRAMUSCULAR | Status: DC | PRN

## 2023-02-18 MED ORDER — ENOXAPARIN SODIUM 60 MG/0.6ML IJ SOSY
60.0000 mg | PREFILLED_SYRINGE | INTRAMUSCULAR | Status: DC
  Administered 2023-02-18: 60 mg via SUBCUTANEOUS
  Filled 2023-02-18: qty 0.6

## 2023-02-18 MED ORDER — ASPIRIN 81 MG PO TBEC: 81.0000 mg | DELAYED_RELEASE_TABLET | Freq: Every day | ORAL | Status: DC

## 2023-02-18 MED ORDER — VANCOMYCIN HCL 2000 MG/400ML IV SOLN
2000.0000 mg | Freq: Once | INTRAVENOUS | Status: AC
  Administered 2023-02-18: 2000 mg via INTRAVENOUS
  Filled 2023-02-18 (×2): qty 400

## 2023-02-18 MED ORDER — ASPIRIN 81 MG PO TBEC
81.0000 mg | DELAYED_RELEASE_TABLET | Freq: Every day | ORAL | Status: DC
  Administered 2023-02-18 – 2023-02-19 (×2): 81 mg via ORAL
  Filled 2023-02-18 (×2): qty 1

## 2023-02-18 MED ORDER — POLYETHYLENE GLYCOL 3350 17 G PO PACK
17.0000 g | PACK | Freq: Every day | ORAL | Status: DC
  Administered 2023-02-18 – 2023-02-19 (×2): 17 g via ORAL
  Filled 2023-02-18 (×2): qty 1

## 2023-02-18 MED ORDER — GABAPENTIN 300 MG PO CAPS: 300.0000 mg | ORAL_CAPSULE | Freq: Two times a day (BID) | ORAL | Status: DC

## 2023-02-18 MED ORDER — INSULIN ASPART 100 UNIT/ML IJ SOLN
0.0000 [IU] | Freq: Three times a day (TID) | INTRAMUSCULAR | Status: DC
  Administered 2023-02-19 (×2): 1 [IU] via SUBCUTANEOUS

## 2023-02-18 MED ORDER — LURASIDONE HCL 40 MG PO TABS
40.0000 mg | ORAL_TABLET | Freq: Every day | ORAL | Status: DC
  Administered 2023-02-19: 40 mg via ORAL
  Filled 2023-02-18 (×3): qty 1

## 2023-02-18 MED ORDER — NIFEDIPINE ER OSMOTIC RELEASE 60 MG PO TB24: 90.0000 mg | ORAL_TABLET | Freq: Every morning | ORAL | Status: DC

## 2023-02-18 MED ORDER — PIPERACILLIN-TAZOBACTAM 3.375 G IVPB
3.3750 g | Freq: Three times a day (TID) | INTRAVENOUS | Status: DC
  Administered 2023-02-18 – 2023-02-19 (×3): 3.375 g via INTRAVENOUS
  Filled 2023-02-18 (×3): qty 50

## 2023-02-18 MED ORDER — ALLOPURINOL 100 MG PO TABS: 100.0000 mg | ORAL_TABLET | Freq: Every day | ORAL | Status: DC

## 2023-02-18 MED ORDER — INSULIN ASPART 100 UNIT/ML IJ SOLN: 0.0000 [IU] | Freq: Every day | INTRAMUSCULAR | Status: DC

## 2023-02-18 MED ORDER — LACTATED RINGERS IV SOLN: INTRAVENOUS | Status: AC

## 2023-02-18 MED ORDER — DONEPEZIL HCL 10 MG PO TABS
10.0000 mg | ORAL_TABLET | Freq: Every day | ORAL | Status: DC
  Administered 2023-02-18 – 2023-02-19 (×2): 10 mg via ORAL
  Filled 2023-02-18 (×2): qty 1

## 2023-02-18 MED ORDER — DEXTROSE 50 % IV SOLN: 1.0000 | Freq: Every day | INTRAVENOUS | Status: DC | PRN

## 2023-02-18 NOTE — Progress Notes (Signed)
Pt is not able to tolerate SCD's at this time d/t bilateral lower extremity wounds.

## 2023-02-18 NOTE — Consult Note (Signed)
[name] 1951/02/28  [id number].    Requesting MD: Dr. Tereasa Coop Chief Complaint/Reason for Consult: psbo  HPI:  This is a 72 yo black female with a history of DM, obesity, HTN, depression/anxiety, TIA, chronic LE wounds, who has been having some anxiety issues over the last several weeks and notes from nausea and dry heaving with this.  She states when she gets anxious that she tends to develop nausea and dry heaving.  She hasn't been eating as much and therefore hasn't been moving her bowels as much.  She doesn't recall the last time she had a BM or flatus.  She developed some nausea and vomiting yesterday with some abdominal discomfort.  This resolved on its own.  She did present to the ED for evaluation and she had a CT scan that questioned maybe a psbo related to a ventral hernia and she was admitted along with abx treatment for her chronic LE wounds.  She currently states her N/V as well as abdominal pain has resolved.  ROS: ROS: see HPI  Family History  Problem Relation Age of Onset   Stroke Father    Stroke Brother    Cancer Other    Gout Mother    Hypertension Mother    Arthritis Mother    Asthma Mother    Stroke Brother     Past Medical History:  Diagnosis Date   Anxiety    Arthritis    "back, arms, legs" (03/24/2016)   Asthma    Chronic lower back pain    Colonic polyp    last colonoscopy done in 2009 with normal results per medical record   Depressive disorder    Gastric polyp    Gout    has taken allopurinol 300mg  once daily in past   Headache    History of hiatal hernia    Hypertension    Migraine    "none in awhile; might have a couple/year" (03/24/2016)   Mixed hyperlipidemia    01/2016 Total chol 141, HDL 59, LDL 63, ration 1.1   Osteoarthritis    TIA (transient ischemic attack) 11/2014   Type II diabetes mellitus (HCC)     Past Surgical History:  Procedure Laterality Date   ARTERY BIOPSY Right 08/12/2016   Procedure: BIOPSY  TEMPORAL ARTERY;  Surgeon: Sherren Kerns, MD;  Location: [facility] OR;  Service: Vascular;  Laterality: Right;  BIOPSY TEMPORAL ARTERY   BREAST BIOPSY Left ~ 2015   benign   CARPAL TUNNEL RELEASE Bilateral    DILATION AND CURETTAGE OF UTERUS     I & D EXTREMITY Bilateral 10/24/2022   Procedure: DEBRIDEMENT LEFT LEG AND RIGHT ANKLE;  Surgeon: Nadara Mustard, MD;  Location: [facility] OR;  Service: Orthopedics;  Laterality: Bilateral;   IR CT HEAD LTD  06/11/2022   IR PERCUTANEOUS ART THROMBECTOMY/INFUSION INTRACRANIAL INC DIAG ANGIO  06/11/2022   IR US GUIDE VASC ACCESS RIGHT  06/11/2022   KNEE ARTHROSCOPY Right 2003   in [facility]   LAPAROSCOPIC CHOLECYSTECTOMY     LOOP RECORDER INSERTION N/A 06/26/2022   Procedure: LOOP RECORDER INSERTION;  Surgeon: Regan Lemming, MD;  Location: [facility] INVASIVE CV LAB;  Service: Cardiovascular;  Laterality: N/A;   RADIOLOGY WITH ANESTHESIA N/A 06/11/2022   Procedure: IR WITH ANESTHESIA;  Surgeon: Radiologist, Medication, MD;  Location: [facility] OR;  Service: Radiology;  Laterality: N/A;   TUBAL LIGATION     VAGINAL HYSTERECTOMY  1982    Social History:  reports  that she has never smoked. She has never used smokeless tobacco. She reports that she does not drink alcohol and does not use drugs.  Allergies:  Allergies  Allergen Reactions   Ace Inhibitors Swelling   Latex Itching and Swelling   Tizanidine Other (See Comments)    Tremors    Ultram [Tramadol] Nausea And Vomiting   Diprivan [Propofol] Itching   Metformin And Related Other (See Comments)    Tremors  Chills    Medications Prior to Admission  Medication Sig Dispense Refill   acetaminophen (TYLENOL) 325 MG tablet Take 2 tablets (650 mg total) by mouth every 6 (six) hours as needed for mild pain (or Fever >/= 101).     allopurinol (ZYLOPRIM) 100 MG tablet Take 100 mg by mouth daily.     aspirin EC 81 MG tablet Take 81 mg by mouth daily. Swallow whole.     atenolol (TENORMIN) 25 MG tablet Take 25-50 mg by mouth  See admin instructions. 50 mg every morning, 25 mg at bedtime     atorvastatin (LIPITOR) 20 MG tablet Take 20 mg by mouth daily.     cyclobenzaprine (FLEXERIL) 5 MG tablet Take 5 mg by mouth 3 (three) times daily as needed for muscle spasms.     donepezil (ARICEPT) 10 MG tablet Take 10 mg by mouth in the morning.     gabapentin (NEURONTIN) 300 MG capsule Take 1 capsule (300 mg total) by mouth 2 (two) times daily.     HYDROcodone-acetaminophen (NORCO) 10-325 MG tablet Take 1 tablet by mouth 2 (two) times daily as needed for severe pain.     insulin glargine (LANTUS) 100 UNIT/ML Solostar Pen Inject 30 Units into the skin every morning. And pen needles 1/day 75 mL 3   leptospermum manuka honey (MEDIHONEY) PSTE paste Apply 1 Application topically daily. 44 mL 0   lurasidone (LATUDA) 40 MG TABS tablet Take 1 tablet (40 mg total) by mouth daily with breakfast. 30 tablet 1   mineral oil-hydrophilic petrolatum (AQUAPHOR) ointment Apply topically daily. 420 g 0   mupirocin ointment (BACTROBAN) 2 % Place 1 Application into the nose 2 (two) times daily. 22 g 0   NIFEdipine (PROCARDIA XL/NIFEDICAL-XL) 90 MG 24 hr tablet Take 90 mg by mouth in the morning.     ondansetron (ZOFRAN) 4 MG tablet Take 1 tablet (4 mg total) by mouth every 6 (six) hours. 12 tablet 0   ondansetron (ZOFRAN) 4 MG tablet Take 1 tablet (4 mg total) by mouth every 6 (six) hours as needed for nausea. 20 tablet 0   polyethylene glycol powder (GLYCOLAX/MIRALAX) 17 GM/SCOOP powder Take 17 g by mouth daily as needed for mild constipation. 238 g 0     Physical Exam: Blood pressure 128/63, pulse 62, temperature 97.6 F (36.4 C), temperature source Oral, resp. rate 17, height 5\' 5"  (1.651 m), weight 127.5 kg, SpO2 95%. General: pleasant, obese, elderly, black female who is laying in bed in NAD HEENT: head is normocephalic, atraumatic.  Sclera are noninjected.  PERRL.  Ears and nose without any masses or lesions.  Mouth is pink and dry Lungs:  Respiratory effort nonlabored Abd: soft, NT, morbidly obese, Left-sided ventral hernia I think is palpable and soft, but due to body habitus, that is very difficult to confirm.  She has no pain on exam diffusely. +BS, no masses. Psych: A&Ox3 with an appropriate affect.   Results for orders placed or performed during the hospital encounter of 02/17/23 (from the past 48  hour(s))  CBC with Differential     Status: Abnormal   Collection Time: 02/17/23  9:18 PM  Result Value Ref Range   WBC 10.8 (H) 4.0 - 10.5 K/uL   RBC 4.14 3.87 - 5.11 MIL/uL   Hemoglobin 11.2 (L) 12.0 - 15.0 g/dL   HCT 16.1 (L) 09.6 - 04.5 %   MCV 86.7 80.0 - 100.0 fL   MCH 27.1 26.0 - 34.0 pg   MCHC 31.2 30.0 - 36.0 g/dL   RDW 40.9 (H) 81.1 - 91.4 %   Platelets 336 150 - 400 K/uL   nRBC 0.0 0.0 - 0.2 %   Neutrophils Relative % 89 %   Neutro Abs 9.6 (H) 1.7 - 7.7 K/uL   Lymphocytes Relative 7 %   Lymphs Abs 0.7 0.7 - 4.0 K/uL   Monocytes Relative 4 %   Monocytes Absolute 0.4 0.1 - 1.0 K/uL   Eosinophils Relative 0 %   Eosinophils Absolute 0.0 0.0 - 0.5 K/uL   Basophils Relative 0 %   Basophils Absolute 0.0 0.0 - 0.1 K/uL   Immature Granulocytes 0 %   Abs Immature Granulocytes 0.03 0.00 - 0.07 K/uL    Comment: Performed at [facility], [address] [place], [place] [address]  Comprehensive metabolic panel     Status: Abnormal   Collection Time: 02/17/23  9:18 PM  Result Value Ref Range   Sodium 138 135 - 145 mmol/L   Potassium 3.6 3.5 - 5.1 mmol/L   Chloride 104 98 - 111 mmol/L   CO2 19 (L) 22 - 32 mmol/L   Glucose, Bld 177 (H) 70 - 99 mg/dL    Comment: Glucose reference range applies only to samples taken after fasting for at least 8 hours.   BUN 7 (L) 8 - 23 mg/dL   Creatinine, Ser 6.21 0.44 - 1.00 mg/dL   Calcium 9.6 8.9 - 30.8 mg/dL   Total Protein 8.5 (H) 6.5 - 8.1 g/dL   Albumin 2.7 (L) 3.5 - 5.0 g/dL   AST 13 (L) 15 - 41 U/L   ALT 11 0 - 44 U/L   Alkaline Phosphatase 92 38 - 126 U/L    Total Bilirubin 0.7 0.3 - 1.2 mg/dL   GFR, Estimated >65 >78 mL/min    Comment: (NOTE) Calculated using the CKD-EPI Creatinine Equation (2021)    Anion gap 15 5 - 15    Comment: Performed at [facility], [address], [place], [place] [address]  Blood culture (routine x 2)     Status: None (Preliminary result)   Collection Time: 02/17/23  9:18 PM   Specimen: BLOOD LEFT FOREARM  Result Value Ref Range   Specimen Description BLOOD LEFT FOREARM    Special Requests      BOTTLES DRAWN AEROBIC AND ANAEROBIC Blood Culture results may not be optimal due to an excessive volume of blood received in culture bottles   Culture      NO GROWTH < 12 HOURS Performed at [facility], [address] [place], [place] [address]    Report Status PENDING   Lipase, blood     Status: None   Collection Time: 02/17/23  9:18 PM  Result Value Ref Range   Lipase 23 11 - 51 U/L    Comment: Performed at [facility], [address] [place], [place] [address]  I-Stat CG4 Lactic Acid     Status: None   Collection Time: 02/18/23 12:02 AM  Result Value Ref Range   Lactic Acid, Venous 1.3 0.5 - 1.9 mmol/L  CBG monitoring, ED     Status: None   Collection Time: 02/18/23  6:43 AM  Result Value Ref Range   Glucose-Capillary 98 70 - 99 mg/dL    Comment: Glucose reference range applies only to samples taken after fasting for at least 8 hours.   Comment 1 Document in Chart    CT ABDOMEN PELVIS W CONTRAST  Result Date: 02/17/2023 CLINICAL DATA:  Nausea, dry heaving, and stomach pain starting today EXAM: CT ABDOMEN AND PELVIS WITH CONTRAST TECHNIQUE: Multidetector CT imaging of the abdomen and pelvis was performed using the standard protocol following bolus administration of intravenous contrast. RADIATION DOSE REDUCTION: This exam was performed according to the departmental dose-optimization program which includes automated exposure control, adjustment of the mA and/or kV according to patient size  and/or use of iterative reconstruction technique. CONTRAST:  75mL OMNIPAQUE IOHEXOL 350 MG/ML SOLN COMPARISON:  None Available. FINDINGS: Lower chest: No acute abnormality. Hepatobiliary: Cholecystectomy. Unremarkable liver and biliary tree. Pancreas: Unremarkable. Spleen: Unremarkable. Adrenals/Urinary Tract: Unremarkable adrenal glands. No urinary calculi or hydronephrosis. Unremarkable bladder. Stomach/Bowel: Distended loops of small bowel in the left hemiabdomen measuring up to 29 mm. There is tapering to the entry into the left ventral abdominal wall hernia. Normal caliber colon. No bowel wall thickening. Colonic diverticulosis without diverticulitis. Normal appendix. Stomach is within normal limits. Vascular/Lymphatic: Normal caliber aorta. Indeterminate prominent retroperitoneal and retrocrural lymph nodes measuring up to 9 mm on series 3/image 9 are favored reactive. Reproductive: Hysterectomy.  No adnexal mass. Other: Mesenteric edema. No free intraperitoneal air. Small bowel containing left ventral abdominal wall hernia. Musculoskeletal: No acute fracture. IMPRESSION: Findings suspicious for partial or early small-bowel obstruction with transition point at the entry into the left ventral abdominal wall hernia. Electronically Signed   By: Minerva Fester M.D.   On: 02/17/2023 23:50   CT HEAD WO CONTRAST ( )  Result Date: 02/17/2023 CLINICAL DATA:  Neuro deficit, acute, stroke suspected EXAM: CT HEAD WITHOUT CONTRAST TECHNIQUE: Contiguous axial images were obtained from the base of the skull through the vertex without intravenous contrast. RADIATION DOSE REDUCTION: This exam was performed according to the departmental dose-optimization program which includes automated exposure control, adjustment of the mA and/or kV according to patient size and/or use of iterative reconstruction technique. COMPARISON:  MRI head 10/25/2022 FINDINGS: Brain: Patchy and confluent areas of decreased attenuation are noted  throughout the deep and periventricular white matter of the cerebral hemispheres bilaterally, compatible with chronic microvascular ischemic disease. 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 No evidence of large-territorial acute infarction. No parenchymal hemorrhage. No mass lesion. No extra-axial collection. No mass effect or midline shift. No hydrocephalus. Basilar cisterns are patent. Vascular: No hyperdense vessel. Skull: No acute fracture or focal lesion. Sinuses/Orbits: Paranasal sinuses and mastoid air cells are clear. The orbits are unremarkable. Other: None. IMPRESSION: No acute intracranial abnormality. Electronically Signed   By: Tish Frederickson M.D.   On: 02/17/2023 23:41   DG Chest Port 1 View  Result Date: 02/17/2023 CLINICAL DATA:  Altered mental status, nausea, and abdominal pain. Suspected bilateral foreleg cellulitis. [id number]. EXAM: PORTABLE CHEST 1 VIEW PORTABLE RIGHT TIBIA AND FIBULA - 2 VIEW PORTABLE LEFT TIBIA AND FIBULA - 2 VIEW COMPARISON:  PA and lateral chest 07/16/2022, left tibia fibula series 07/20/2019, right tibia fibula series 03/16/2017 FINDINGS: CHEST AP PORTABLE 9:10 P.M.: Loop recorder device left chest wall and multiple overlying monitor wires. The  heart is enlarged but unchanged. No vascular congestion is seen. No substantial pleural effusion. The lungs are clear. Degenerative change and slight dextroscoliosis thoracic spine with no new osseous findings. AP LAT RIGHT TIBIA FIBULA: There is mild generalized edema. The edema was greater in 2018 than now. Interval increased multifocal soft tissue calcifications are noted most likely related to venous stasis. There is mild osteopenia with no evidence of fractures or acute osteomyelitis. There is apparent femorotibial degenerative arthrosis. Mild arthrosis of the ankle. AP LATERAL LEFT TIBIA FIBULA: Mild-to-moderate generalized edema. There are increased multifocal soft tissue calcifications most likely due to  venous stasis. Irregularity along the mid pretibial skin surface could indicate areas of ulceration. This is also seen posteriorly in the mid foreleg. No foreign body or soft tissue gas are identified. Underlying bones are mildly osteopenic but intact. There is advanced femorotibial degenerative arthrosis with mild arthrosis of the ankle. Small plantar and dorsal calcaneal spurs. IMPRESSION: 1. No evidence of acute chest disease. 2. Cardiomegaly. 3. Interval increased multifocal soft tissue calcifications both forelegs most likely due to venous stasis. 4. Left-greater-than-right soft tissue edema in the forelegs. 5. Irregularity along the left mid pretibial and posterior skin surface which could indicate areas of ulceration. 6. No evidence of fractures or acute osteomyelitis in either foreleg. 7. Advanced arthrosis of both knees. Electronically Signed   By: Almira Bar M.D.   On: 02/17/2023 22:37   DG Tibia/Fibula Left Port  Result Date: 02/17/2023 CLINICAL DATA:  Altered mental status, nausea, and abdominal pain. Suspected bilateral foreleg cellulitis. [id number]. EXAM: PORTABLE CHEST 1 VIEW PORTABLE RIGHT TIBIA AND FIBULA - 2 VIEW PORTABLE LEFT TIBIA AND FIBULA - 2 VIEW COMPARISON:  PA and lateral chest 07/16/2022, left tibia fibula series 07/20/2019, right tibia fibula series 03/16/2017 FINDINGS: CHEST AP PORTABLE 9:10 P.M.: Loop recorder device left chest wall and multiple overlying monitor wires. The heart is enlarged but unchanged. No vascular congestion is seen. No substantial pleural effusion. The lungs are clear. Degenerative change and slight dextroscoliosis thoracic spine with no new osseous findings. AP LAT RIGHT TIBIA FIBULA: There is mild generalized edema. The edema was greater in 2018 than now. Interval increased multifocal soft tissue calcifications are noted most likely related to venous stasis. There is mild osteopenia with no evidence of fractures or acute osteomyelitis. There is apparent  femorotibial degenerative arthrosis. Mild arthrosis of the ankle. AP LATERAL LEFT TIBIA FIBULA: Mild-to-moderate generalized edema. There are increased multifocal soft tissue calcifications most likely due to venous stasis. Irregularity along the mid pretibial skin surface could indicate areas of ulceration. This is also seen posteriorly in the mid foreleg. No foreign body or soft tissue gas are identified. Underlying bones are mildly osteopenic but intact. There is advanced femorotibial degenerative arthrosis with mild arthrosis of the ankle. Small plantar and dorsal calcaneal spurs. IMPRESSION: 1. No evidence of acute chest disease. 2. Cardiomegaly. 3. Interval increased multifocal soft tissue calcifications both forelegs most likely due to venous stasis. 4. Left-greater-than-right soft tissue edema in the forelegs. 5. Irregularity along the left mid pretibial and posterior skin surface which could indicate areas of ulceration. 6. No evidence of fractures or acute osteomyelitis in either foreleg. 7. Advanced arthrosis of both knees. Electronically Signed   By: Almira Bar M.D.   On: 02/17/2023 22:37   DG Tibia/Fibula Right Port  Result Date: 02/17/2023 CLINICAL DATA:  Altered mental status, nausea, and abdominal pain. Suspected bilateral foreleg cellulitis. [id number]. EXAM: PORTABLE CHEST 1 VIEW PORTABLE RIGHT TIBIA  AND FIBULA - 2 VIEW PORTABLE LEFT TIBIA AND FIBULA - 2 VIEW COMPARISON:  PA and lateral chest 07/16/2022, left tibia fibula series 07/20/2019, right tibia fibula series 03/16/2017 FINDINGS: CHEST AP PORTABLE 9:10 P.M.: Loop recorder device left chest wall and multiple overlying monitor wires. The heart is enlarged but unchanged. No vascular congestion is seen. No substantial pleural effusion. The lungs are clear. Degenerative change and slight dextroscoliosis thoracic spine with no new osseous findings. AP LAT RIGHT TIBIA FIBULA: There is mild generalized edema. The edema was greater in 2018 than  now. Interval increased multifocal soft tissue calcifications are noted most likely related to venous stasis. There is mild osteopenia with no evidence of fractures or acute osteomyelitis. There is apparent femorotibial degenerative arthrosis. Mild arthrosis of the ankle. AP LATERAL LEFT TIBIA FIBULA: Mild-to-moderate generalized edema. There are increased multifocal soft tissue calcifications most likely due to venous stasis. Irregularity along the mid pretibial skin surface could indicate areas of ulceration. This is also seen posteriorly in the mid foreleg. No foreign body or soft tissue gas are identified. Underlying bones are mildly osteopenic but intact. There is advanced femorotibial degenerative arthrosis with mild arthrosis of the ankle. Small plantar and dorsal calcaneal spurs. IMPRESSION: 1. No evidence of acute chest disease. 2. Cardiomegaly. 3. Interval increased multifocal soft tissue calcifications both forelegs most likely due to venous stasis. 4. Left-greater-than-right soft tissue edema in the forelegs. 5. Irregularity along the left mid pretibial and posterior skin surface which could indicate areas of ulceration. 6. No evidence of fractures or acute osteomyelitis in either foreleg. 7. Advanced arthrosis of both knees. Electronically Signed   By: Almira Bar M.D.   On: 02/17/2023 22:37      Assessment/Plan Abdominal pain, N/V The patient has been seen, examined, labs, chart, vitals, and imaging personally reviewed.  There was some concern on her CT scan for the small bowel entering her ventral hernia.  The patient's abdomen today is very soft and nontender.  She has no further N/V.  It sounds like she thinks her previous nausea is more related to her anxiety.  Unclear if this episode is related to that or a temporary partial blockage from her hernia.  Her small is not distended on her CT scan.  We will go ahead and let her have clear liquids and put her on daily miralax for a bowel  regimen.  If she tolerates this, she can have a carb mod diet for dinner.  Discussed this with patient and husband who is at bedside.   FEN - CLD, carb mod for dinner, miralax VTE - Lovenox ID - zosyn/vanc for her LEs wounds  DM Chronic LE wounds HTN Obesity TIA Depression/anxiety  I reviewed hospitalist notes, last 24 h vitals and pain scores, last 48 h intake and output, last 24 h labs and trends, and last 24 h imaging results.  Letha Cape, [facility] 02/18/2023, 9:40 AM Please see Amion for pager number during day hours 7:00am-4:30pm or 7:00am -11:30am on weekends

## 2023-02-18 NOTE — Plan of Care (Signed)
  Problem: Nutrition: Goal: Adequate nutrition will be maintained Outcome: Progressing   Problem: Elimination: Goal: Will not experience complications related to bowel motility Outcome: Progressing   Problem: Safety: Goal: Ability to remain free from injury will improve Outcome: Progressing   Problem: Fluid Volume: Goal: Ability to maintain a balanced intake and output will improve Outcome: Progressing

## 2023-02-18 NOTE — TOC Initial Note (Signed)
Transition of Care (TOC) - Initial/Assessment Note   Spoke to patient at bedside , confirmed face sheet information with patient. Patient from home with husband. Address [address] , [place] , [place]   Patient has power chair at home and states she can transfer herself.   Patient states her PCP is looking into getting her personnel care services. However, patient does not have Medicaid , therefore private pay. Patient was unaware.   Patient had [facility] , was discharged 01/04/23. She had PT/OT , if home health recommended again she prefers to use another agency  Patient Details  Name: [name] MRN: [id number] Date of Birth: Nov 10, 1950  Transition of Care [facility]) CM/SW Contact:    Kingsley Plan, RN Phone Number: 02/18/2023, 1:45 PM  Clinical Narrative:                         Patient Goals and CMS Choice Patient states their goals for this hospitalization and ongoing recovery are:: to return tohome          Expected Discharge Plan and Services                                   HH Arranged:  (await recommendations)          Prior Living Arrangements/Services   Lives with:: Spouse Patient language and need for interpreter reviewed:: Yes Do you feel safe going back to the place where you live?: Yes      Need for Family Participation in Patient Care: Yes (Comment) Care giver support system in place?: Yes (comment) Current home services: DME Criminal Activity/Legal Involvement Pertinent to Current Situation/Hospitalization: No - Comment as needed  Activities of Daily Living Home Assistive Devices/Equipment: Trapeze, Grab bars in shower, Other (Comment), Shower chair with back (power chair) ADL Screening (condition at time of admission) Patient's cognitive ability adequate to safely complete daily activities?: Yes Is the patient deaf or have difficulty hearing?: Yes Does the patient have difficulty seeing, even when wearing glasses/contacts?:  Yes (glasses at home) Does the patient have difficulty concentrating, remembering, or making decisions?: Yes (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) Patient able to express need for assistance with ADLs?: Yes Does the patient have difficulty dressing or bathing?: Yes Independently performs ADLs?: Yes (appropriate for developmental age) Does the patient have difficulty walking or climbing stairs?: Yes Weakness of Legs: Both Weakness of Arms/Hands: Left  Permission Sought/Granted   Permission granted to share information with : No              Emotional Assessment Appearance:: Appears stated age Attitude/Demeanor/Rapport: Engaged Affect (typically observed): Accepting Orientation: : Oriented to Self, Oriented to Place, Oriented to  Time, Oriented to Situation Alcohol / Substance Use: Not Applicable Psych Involvement: No (comment)  Admission diagnosis:  Non-healing wound of lower extremity [S81.809A] Wound of left lower extremity, initial encounter [S81.802A] Patient Active Problem List   Diagnosis Date Noted   SBO (small bowel obstruction) (HCC) 02/18/2023   GAD (generalized anxiety disorder) 02/18/2023   Asthma, chronic 02/18/2023   Diabetic ulcer of left lower leg (HCC) 10/24/2022   Chronic venous hypertension (idiopathic) with ulcer and inflammation of bilateral lower extremity (HCC) 10/24/2022   Non-healing wound of lower extremity 10/23/2022   Bacteria in urine 06/26/2022   Stasis ulcer of lower extremity (HCC) 06/26/2022   Elevated troponin 06/25/2022   Abnormal  urinalysis 06/25/2022   History of CVA (cerebrovascular accident) 06/25/2022   Memory loss 06/25/2022   Cryptogenic stroke (HCC) 06/11/2022   Middle cerebral artery embolism, 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   Wound infection 02/05/2021   Venous stasis dermatitis of both lower extremities    Pneumonia due to COVID-19 virus 06/29/2019   Hypophosphatemia 06/24/2019   Obesity 06/23/2019    Insulin dependent type 2 diabetes mellitus (HCC) 06/22/2019   Anxiety 06/22/2019   Essential hypertension 06/22/2019   Hyperlipidemia 06/22/2019   COVID-19 06/21/2019   Severe protein-calorie malnutrition (HCC) 06/21/2019   Acute respiratory failure with hypoxia (HCC) 06/21/2019   Diabetic foot ulcer associated with type 2 diabetes mellitus (HCC)    Cellulitis 07/05/2016   Depression 05/30/2016   Lower extremity ulceration (HCC) 05/30/2016   Generalized weakness 05/30/2016   Left arm weakness 05/30/2016   Left Lower extremity pain  04/02/2016   Lower extremity pain, inferior, left 04/02/2016   Left leg pain    Personal history of noncompliance with medical treatment, presenting hazards to health 03/31/2016   Jaw pain 03/27/2016   Left leg cellulitis 03/24/2016   Cellulitis of left lower extremity    Primary insomnia    Adjustment disorder    Morbid obesity (HCC) 01/27/2016   Wheelchair dependent 01/27/2016   Gout 01/27/2016   Arthritis 01/27/2016   Uncontrolled type 2 diabetes mellitus with hypoglycemia, with long-term current use of insulin (HCC) 01/25/2016   Cellulitis of left leg 01/11/2016   Insulin-requiring or dependent type II diabetes mellitus (HCC) 01/11/2016   PCP:  Hillery Aldo, NP Pharmacy:   [facility] [address] - [place], [place] - [address] [place] [address] Phone: [phone] Fax: [phone]  [id number] [place], [place] - [address] AT [address] [place] [address] Phone: [phone] Fax: [phone]  [facility] [address] [place] [address] Phone: [phone] Fax: [phone]  [facility] #[id number] [place], [place] - [address] AT [facility] [address] [place] [address] Phone: [phone] Fax: [phone]     Social Determinants of Health (SDOH) Social  History: SDOH Screenings   Food Insecurity: No Food Insecurity (10/23/2022)  Housing: Low Risk  (10/23/2022)  Transportation Needs: No Transportation Needs (10/23/2022)  Utilities: Not At Risk (10/23/2022)  Tobacco Use: Low Risk  (02/18/2023)   SDOH Interventions:     Readmission Risk Interventions     No data to display

## 2023-02-18 NOTE — ED Provider Notes (Signed)
Received patient in turnover from Dr. Silverio Lay.  Please see their note for further details of Hx, PE.  Briefly patient is a 72 y.o. female with a No chief complaint on file. .  Patient with a chief complaints of difficulty with a foot wound.  She has obvious necrosis with maggots.  The case was discussed with orthopedics who will see the patient in the morning.  CT imaging for the head and abdomen and pelvis is pending.  Plan to admit to medicine post imaging.  CT of the head without obvious acute finding.  CT of the abdomen pelvis with read of possible early partial small bowel obstruction.  On my discussion with the patient she has no specific symptoms consistent with a small bowel obstruction.  Has had decreased appetite over weeks to months.  Has no abdominal pain on exam.  Secure chat message sent to general surgery per protocol.  Will discuss with medicine for admission.    Melene Plan, DO 02/18/23 [phone]

## 2023-02-18 NOTE — Progress Notes (Addendum)
PROGRESS NOTE    [name]  MWN:[id number] DOB: [birth year] DOA: 02/17/2023 PCP: Hillery Aldo, NP   Brief Narrative: 72 year old with past medical history significant for hypertension, hyperlipidemia, history of stroke, insulin-dependent diabetes type 2, chronic anxiety, chronic depression, bipolar disorder, osteoarthritis, chronic lower back pain, gout, mild dementia, chronic bilateral lower extremity wound, venous stasis ulcer, morbid obesity presented to the ED for evaluation of abdominal pain, nausea vomiting, poor appetite.  Per son at bedside patient has had poor appetite and has 40 pounds weight loss over the last month.  CT abdomen and pelvis suspicious for partial or small bowel obstruction with transition point at the entry of the left ventral abdominal wall and wall hernia.  Patient is also noted to have chronic.  X-ray bilateral fibula tibia left greater than right soft tissue edema both forelegs, irregularity along the left mid pretibial and posterior skin surface which could indicate areas of ulceration.   Assessment & Plan:   Principal Problem:   Non-healing wound of lower extremity Active Problems:   Lower extremity ulceration (HCC)   History of CVA (cerebrovascular accident)   Essential hypertension   Gout   Depression   Insulin dependent type 2 diabetes mellitus (HCC)   Hyperlipidemia   SBO (small bowel obstruction) (HCC)   GAD (generalized anxiety disorder)   Asthma, chronic   1-Nausea vomiting.  Small bowel obstruction CT abdomen Pelvis:  suspicious for partial or small bowel obstruction with transition point at the entry of the left ventral abdominal wall and wall hernia. Evaluated by surgery , patient might have had temporary blockage by her hernia. No further nausea or vomiting. Plan to advance diet as tolerated.   Chronic bilateral lower extremity wound and ulceration Wound care consulted Dr. Lajoyce Corners consulted.  Started on vancomycin and zosyn.  Needs  HH for wound care, per patient wound care nurse stop going to her house.   History of hypertension: PRN hydralazine.  Resume atenolol.   History of CVA: On aspirin and Lipitor.   Generalized anxiety disorder: Depression: On Lurasidone.   Memory impairment.  On Aricept.   Insulin dependent type II  A1c: 8.1 SSI  Hyperlipidemia;  On lipitor.   Asthma:  PRN albuterol.      Estimated body mass index is 46.76 kg/m as calculated from the following:   Height as of this encounter: 5\' 5"  (1.651 m).   Weight as of this encounter: 127.5 kg.   DVT prophylaxis: Lovenox Code Status: Full code Family Communication:Husband at bedsdie Disposition Plan:  Status is: Inpatient Remains inpatient appropriate because: management of Nausea, vomiting.     Consultants:  Dr Lajoyce Corners Surgery   Procedures:    Antimicrobials:    Subjective: She report no more nausea or vomiting.  Denies abdominal pain.   Objective: Vitals:   02/18/23 0600 02/18/23 0630 02/18/23 0730 02/18/23 0824  BP: (!) 155/72 138/70 136/67 128/63  Pulse:   (!) 59 62  Resp: 18 (!) 21 14 17   Temp:    97.6 F (36.4 C)  TempSrc:    Oral  SpO2:   95% 95%  Weight:      Height:        Intake/Output Summary (Last 24 hours) at 02/18/2023 0839 Last data filed at 02/17/2023 2256 Gross per 24 hour  Intake 1050 ml  Output --  Net 1050 ml   Filed Weights   02/18/23 0030  Weight: 127.5 kg    Examination:  General exam: Appears calm and comfortable  Respiratory system: Clear to auscultation. Respiratory effort normal. Cardiovascular system: S1 & S2 heard, RRR. No JVD, murmurs, rubs, gallops or clicks. No pedal edema. Gastrointestinal system: Abdomen is nondistended, soft and nontender. No organomegaly or masses felt. Normal bowel sounds heard. Central nervous system: Alert and oriented. No focal neurological deficits. Extremities: LE with dressing. See picture on HPI  Data Reviewed: I have personally  reviewed following labs and imaging studies  CBC: Recent Labs  Lab 02/17/23 2118  WBC 10.8*  NEUTROABS 9.6*  HGB 11.2*  HCT 35.9*  MCV 86.7  PLT 336   Basic Metabolic Panel: Recent Labs  Lab 02/17/23 2118  NA 138  K 3.6  CL 104  CO2 19*  GLUCOSE 177*  BUN 7*  CREATININE 0.83  CALCIUM 9.6   GFR: Estimated Creatinine Clearance: 81.2 mL/min (by C-G formula based on SCr of 0.83 mg/dL). Liver Function Tests: Recent Labs  Lab 02/17/23 2118  AST 13*  ALT 11  ALKPHOS 92  BILITOT 0.7  PROT 8.5*  ALBUMIN 2.7*   Recent Labs  Lab 02/17/23 2118  LIPASE 23   No results for input(s): "AMMONIA" in the last 168 hours. Coagulation Profile: No results for input(s): "INR", "PROTIME" in the last 168 hours. Cardiac Enzymes: No results for input(s): "CKTOTAL", "CKMB", "CKMBINDEX", "TROPONINI" in the last 168 hours. BNP (last 3 results) No results for input(s): "PROBNP" in the last 8760 hours. HbA1C: No results for input(s): "HGBA1C" in the last 72 hours. CBG: Recent Labs  Lab 02/18/23 0643  GLUCAP 98   Lipid Profile: No results for input(s): "CHOL", "HDL", "LDLCALC", "TRIG", "CHOLHDL", "LDLDIRECT" in the last 72 hours. Thyroid Function Tests: No results for input(s): "TSH", "T4TOTAL", "FREET4", "T3FREE", "THYROIDAB" in the last 72 hours. Anemia Panel: No results for input(s): "VITAMINB12", "FOLATE", "FERRITIN", "TIBC", "IRON", "RETICCTPCT" in the last 72 hours. Sepsis Labs: Recent Labs  Lab 02/18/23 0002  LATICACIDVEN 1.3    Recent Results (from the past 240 hour(s))  Blood culture (routine x 2)     Status: None (Preliminary result)   Collection Time: 02/17/23  9:18 PM   Specimen: BLOOD LEFT FOREARM  Result Value Ref Range Status   Specimen Description BLOOD LEFT FOREARM  Final   Special Requests   Final    BOTTLES DRAWN AEROBIC AND ANAEROBIC Blood Culture results may not be optimal due to an excessive volume of blood received in culture bottles   Culture    Final    NO GROWTH < 12 HOURS Performed at [facility], [address], [place], [place] [address]    Report Status PENDING  Incomplete         Radiology Studies: CT ABDOMEN PELVIS W CONTRAST  Result Date: 02/17/2023 CLINICAL DATA:  Nausea, dry heaving, and stomach pain starting today EXAM: CT ABDOMEN AND PELVIS WITH CONTRAST TECHNIQUE: Multidetector CT imaging of the abdomen and pelvis was performed using the standard protocol following bolus administration of intravenous contrast. RADIATION DOSE REDUCTION: This exam was performed according to the departmental dose-optimization program which includes automated exposure control, adjustment of the mA and/or kV according to patient size and/or use of iterative reconstruction technique. CONTRAST:  75mL OMNIPAQUE IOHEXOL 350 MG/ML SOLN COMPARISON:  None Available. FINDINGS: Lower chest: No acute abnormality. Hepatobiliary: Cholecystectomy. Unremarkable liver and biliary tree. Pancreas: Unremarkable. Spleen: Unremarkable. Adrenals/Urinary Tract: Unremarkable adrenal glands. No urinary calculi or hydronephrosis. Unremarkable bladder. Stomach/Bowel: Distended loops of small bowel in the left hemiabdomen measuring up to 29 mm. There is  tapering to the entry into the left ventral abdominal wall hernia. Normal caliber colon. No bowel wall thickening. Colonic diverticulosis without diverticulitis. Normal appendix. Stomach is within normal limits. Vascular/Lymphatic: Normal caliber aorta. Indeterminate prominent retroperitoneal and retrocrural lymph nodes measuring up to 9 mm on series 3/image 9 are favored reactive. Reproductive: Hysterectomy.  No adnexal mass. Other: Mesenteric edema. No free intraperitoneal air. Small bowel containing left ventral abdominal wall hernia. Musculoskeletal: No acute fracture. IMPRESSION: Findings suspicious for partial or early small-bowel obstruction with transition point at the entry into the left ventral abdominal wall  hernia. Electronically Signed   By: Minerva Fester M.D.   On: 02/17/2023 23:50   CT HEAD WO CONTRAST ( )  Result Date: 02/17/2023 CLINICAL DATA:  Neuro deficit, acute, stroke suspected EXAM: CT HEAD WITHOUT CONTRAST TECHNIQUE: Contiguous axial images were obtained from the base of the skull through the vertex without intravenous contrast. RADIATION DOSE REDUCTION: This exam was performed according to the departmental dose-optimization program which includes automated exposure control, adjustment of the mA and/or kV according to patient size and/or use of iterative reconstruction technique. COMPARISON:  MRI head 10/25/2022 FINDINGS: Brain: Patchy and confluent areas of decreased attenuation are noted throughout the deep and periventricular white matter of the cerebral hemispheres bilaterally, compatible with chronic microvascular ischemic disease. 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 No evidence of large-territorial acute infarction. No parenchymal hemorrhage. No mass lesion. No extra-axial collection. No mass effect or midline shift. No hydrocephalus. Basilar cisterns are patent. Vascular: No hyperdense vessel. Skull: No acute fracture or focal lesion. Sinuses/Orbits: Paranasal sinuses and mastoid air cells are clear. The orbits are unremarkable. Other: None. IMPRESSION: No acute intracranial abnormality. Electronically Signed   By: Tish Frederickson M.D.   On: 02/17/2023 23:41   DG Chest Port 1 View  Result Date: 02/17/2023 CLINICAL DATA:  Altered mental status, nausea, and abdominal pain. Suspected bilateral foreleg cellulitis. [id number]. EXAM: PORTABLE CHEST 1 VIEW PORTABLE RIGHT TIBIA AND FIBULA - 2 VIEW PORTABLE LEFT TIBIA AND FIBULA - 2 VIEW COMPARISON:  PA and lateral chest 07/16/2022, left tibia fibula series 07/20/2019, right tibia fibula series 03/16/2017 FINDINGS: CHEST AP PORTABLE 9:10 P.M.: Loop recorder device left chest wall and multiple overlying monitor wires. The heart is  enlarged but unchanged. No vascular congestion is seen. No substantial pleural effusion. The lungs are clear. Degenerative change and slight dextroscoliosis thoracic spine with no new osseous findings. AP LAT RIGHT TIBIA FIBULA: There is mild generalized edema. The edema was greater in 2018 than now. Interval increased multifocal soft tissue calcifications are noted most likely related to venous stasis. There is mild osteopenia with no evidence of fractures or acute osteomyelitis. There is apparent femorotibial degenerative arthrosis. Mild arthrosis of the ankle. AP LATERAL LEFT TIBIA FIBULA: Mild-to-moderate generalized edema. There are increased multifocal soft tissue calcifications most likely due to venous stasis. Irregularity along the mid pretibial skin surface could indicate areas of ulceration. This is also seen posteriorly in the mid foreleg. No foreign body or soft tissue gas are identified. Underlying bones are mildly osteopenic but intact. There is advanced femorotibial degenerative arthrosis with mild arthrosis of the ankle. Small plantar and dorsal calcaneal spurs. IMPRESSION: 1. No evidence of acute chest disease. 2. Cardiomegaly. 3. Interval increased multifocal soft tissue calcifications both forelegs most likely due to venous stasis. 4. Left-greater-than-right soft tissue edema in the forelegs. 5. Irregularity along the left mid pretibial and posterior skin surface which could indicate areas of ulceration. 6. No evidence  of fractures or acute osteomyelitis in either foreleg. 7. Advanced arthrosis of both knees. Electronically Signed   By: Almira Bar M.D.   On: 02/17/2023 22:37   DG Tibia/Fibula Left Port  Result Date: 02/17/2023 CLINICAL DATA:  Altered mental status, nausea, and abdominal pain. Suspected bilateral foreleg cellulitis. [id number]. EXAM: PORTABLE CHEST 1 VIEW PORTABLE RIGHT TIBIA AND FIBULA - 2 VIEW PORTABLE LEFT TIBIA AND FIBULA - 2 VIEW COMPARISON:  PA and lateral chest  07/16/2022, left tibia fibula series 07/20/2019, right tibia fibula series 03/16/2017 FINDINGS: CHEST AP PORTABLE 9:10 P.M.: Loop recorder device left chest wall and multiple overlying monitor wires. The heart is enlarged but unchanged. No vascular congestion is seen. No substantial pleural effusion. The lungs are clear. Degenerative change and slight dextroscoliosis thoracic spine with no new osseous findings. AP LAT RIGHT TIBIA FIBULA: There is mild generalized edema. The edema was greater in 2018 than now. Interval increased multifocal soft tissue calcifications are noted most likely related to venous stasis. There is mild osteopenia with no evidence of fractures or acute osteomyelitis. There is apparent femorotibial degenerative arthrosis. Mild arthrosis of the ankle. AP LATERAL LEFT TIBIA FIBULA: Mild-to-moderate generalized edema. There are increased multifocal soft tissue calcifications most likely due to venous stasis. Irregularity along the mid pretibial skin surface could indicate areas of ulceration. This is also seen posteriorly in the mid foreleg. No foreign body or soft tissue gas are identified. Underlying bones are mildly osteopenic but intact. There is advanced femorotibial degenerative arthrosis with mild arthrosis of the ankle. Small plantar and dorsal calcaneal spurs. IMPRESSION: 1. No evidence of acute chest disease. 2. Cardiomegaly. 3. Interval increased multifocal soft tissue calcifications both forelegs most likely due to venous stasis. 4. Left-greater-than-right soft tissue edema in the forelegs. 5. Irregularity along the left mid pretibial and posterior skin surface which could indicate areas of ulceration. 6. No evidence of fractures or acute osteomyelitis in either foreleg. 7. Advanced arthrosis of both knees. Electronically Signed   By: Almira Bar M.D.   On: 02/17/2023 22:37   DG Tibia/Fibula Right Port  Result Date: 02/17/2023 CLINICAL DATA:  Altered mental status, nausea, and  abdominal pain. Suspected bilateral foreleg cellulitis. [id number]. EXAM: PORTABLE CHEST 1 VIEW PORTABLE RIGHT TIBIA AND FIBULA - 2 VIEW PORTABLE LEFT TIBIA AND FIBULA - 2 VIEW COMPARISON:  PA and lateral chest 07/16/2022, left tibia fibula series 07/20/2019, right tibia fibula series 03/16/2017 FINDINGS: CHEST AP PORTABLE 9:10 P.M.: Loop recorder device left chest wall and multiple overlying monitor wires. The heart is enlarged but unchanged. No vascular congestion is seen. No substantial pleural effusion. The lungs are clear. Degenerative change and slight dextroscoliosis thoracic spine with no new osseous findings. AP LAT RIGHT TIBIA FIBULA: There is mild generalized edema. The edema was greater in 2018 than now. Interval increased multifocal soft tissue calcifications are noted most likely related to venous stasis. There is mild osteopenia with no evidence of fractures or acute osteomyelitis. There is apparent femorotibial degenerative arthrosis. Mild arthrosis of the ankle. AP LATERAL LEFT TIBIA FIBULA: Mild-to-moderate generalized edema. There are increased multifocal soft tissue calcifications most likely due to venous stasis. Irregularity along the mid pretibial skin surface could indicate areas of ulceration. This is also seen posteriorly in the mid foreleg. No foreign body or soft tissue gas are identified. Underlying bones are mildly osteopenic but intact. There is advanced femorotibial degenerative arthrosis with mild arthrosis of the ankle. Small plantar and dorsal calcaneal spurs. IMPRESSION: 1. No evidence of  acute chest disease. 2. Cardiomegaly. 3. Interval increased multifocal soft tissue calcifications both forelegs most likely due to venous stasis. 4. Left-greater-than-right soft tissue edema in the forelegs. 5. Irregularity along the left mid pretibial and posterior skin surface which could indicate areas of ulceration. 6. No evidence of fractures or acute osteomyelitis in either foreleg. 7. Advanced  arthrosis of both knees. Electronically Signed   By: Almira Bar M.D.   On: 02/17/2023 22:37        Scheduled Meds:  aspirin EC  81 mg Oral Daily   atorvastatin  20 mg Oral Daily   donepezil  10 mg Oral Daily   enoxaparin (LOVENOX) injection  60 mg Subcutaneous Q24H   lurasidone  40 mg Oral Q breakfast   sodium chloride flush  3 mL Intravenous Q12H   Continuous Infusions:  sodium chloride     lactated ringers 100 mL/hr at 02/18/23 0641     LOS: 0 days    Time spent: 35 minutes    Nitesh Pitstick A Osmond Steckman, MD [facility]   If 7PM-7AM, please contact night-coverage [email or web address]  02/18/2023, 8:39 AM

## 2023-02-18 NOTE — Consult Note (Signed)
WOC Nurse Consult Note: patient with chronic venous insufficiency followed by Dr. Lajoyce Corners last seen 12/30/2022 with instructions for home health to come out 3 times a week to clean and wrap legs; patient states home health stopped coming out. Patient has been intolerant of compression wraps in past ; patient had maggots in wounds when arrived in hospital and this has been noted before at Dr. Audrie Lia office  Reason for Consult: chronic bilateral lower leg wounds  Wound type: full thickness, likely r/t venous insufficiency  Pressure Injury POA: NA  Measurement: 1.  R anterior foot full thickness ulcer 4 cm x 7 cm x 0.2 cm 60% yellow fibrin 40% pink moist  2.  Left anterior foot full thickness 7 cm x 9 cm x 0.2 cm 50% yellow fibrin 50%  pink moist  3.  L medial lower leg 10 cm x 10 cm total area healing full thickness wound small amount of yellow fibrin most distal portion   Drainage (amount, consistency, odor) minimal tan exudate B foot wounds, otherwise dry  Periwound: bilateral legs with chronic changes of venous insufficiency, edema hyperkeratotic skin  Dressing procedure/placement/frequency: Clean R and L anterior foot, L medial lower leg wounds with Vashe [place] [phone]) wound cleanser, apply Vashe moistened (not saturated) gauze to open areas twice daily, cover with dry gauze and ABD pads.  Wrap in Kerlix roll gauze beginning just above toes and ending right below knees.  Secure dressing with Ace bandaged wrapped in same fashion.  This will also apply some light compression.    Above wound care performed by [facility] nurse at this time.   Patient states dressings stick to R foot wound and therefore should be soaked with NS prior to removal for atraumatic removal.    Patient has pending consult with Dr. Lajoyce Corners. Any wound care orders Dr. Lajoyce Corners places will supercede WOC wound care orders.   POC discussed with patient, primary nurse and primary MD.  WOC team will not follow at this time. Re-consult if further  needs arise.   Thank you,    Priscella Mann MSN, RN-BC, [employer] [phone]

## 2023-02-18 NOTE — Progress Notes (Signed)
Pharmacy Antibiotic Note  [name] is a 72 y.o. female admitted on 02/17/2023 with BLE wound infection.  Pharmacy has been consulted for vancomycin and Zosyn dosing.  Plan: Vancomycin 2000mg  x1 then 750mg  IV Q12H. Goal AUC 400-550.  Expected AUC 475. Zosyn 3.375g IV Q8H (4-hour infusion).  Height: 5\' 5"  (165.1 cm) Weight: 127.5 kg (281 lb) IBW/kg (Calculated) : 57  Temp (24hrs), Avg:98.2 F (36.8 C), Min:97.5 F (36.4 C), Max:98.7 F (37.1 C)  Recent Labs  Lab 02/17/23 2118 02/18/23 0002  WBC 10.8*  --   CREATININE 0.83  --   LATICACIDVEN  --  1.3    Estimated Creatinine Clearance: 81.2 mL/min (by C-G formula based on SCr of 0.83 mg/dL).    Allergies  Allergen Reactions   Ace Inhibitors Swelling   Latex Itching and Swelling   Tizanidine Other (See Comments)    Tremors    Ultram [Tramadol] Nausea And Vomiting   Diprivan [Propofol] Itching   Metformin And Related Other (See Comments)    Tremors  Chills    Thank you for allowing pharmacy to be a part of this patient's care.  Vernard Gambles, PharmD, BCPS  02/18/2023 6:22 AM

## 2023-02-18 NOTE — ED Notes (Signed)
Patient in content of urine, patient cleaned, linens changed, belongings placed in belongings bag, warm blankets given to patient. Patient clean and dry at this time.

## 2023-02-18 NOTE — ED Notes (Signed)
ED TO INPATIENT HANDOFF REPORT  ED Nurse Name and Phone #: Einar Grad [phone]  S Name/Age/Gender [name] 72 y.o. female Room/Bed: 010C/010C  Code Status   Code Status: Full Code  Home/SNF/Other Home  Patient oriented to: self, place, time, and situation Is this baseline? Yes   Triage Complete: Triage complete  Chief Complaint Non-healing wound of lower extremity [S81.809A]  Triage Note Patient BIB [facility] from an extended stay for nausea, dry heaving, and stomach pain starting today as well as "not responding right" for an hour per family to EMS. Family was concerned about the patient's sugar which was 180 with EMS. EMS also reports gangrenous bilateral feet. VSS, patient comfortable and resting at this time   Allergies Allergies  Allergen Reactions   Ace Inhibitors Swelling   Latex Itching and Swelling   Tizanidine Other (See Comments)    Tremors    Ultram [Tramadol] Nausea And Vomiting   Diprivan [Propofol] Itching   Metformin And Related Other (See Comments)    Tremors  Chills    Level of Care/Admitting Diagnosis ED Disposition     ED Disposition  Admit   Condition  --   Comment  Hospital Area: [facility] [[id number]]  Level of Care: Med-Surg [16]  May admit patient to [facility] or [facility] if equivalent level of care is available:: No  Covid Evaluation: Asymptomatic - no recent exposure (last 10 days) testing not required  Diagnosis: Non-healing wound of lower extremity [[id number]]  Admitting Physician: Tereasa Coop [[id number]]  Attending Physician: Tereasa Coop [[id number]]  Certification:: I certify this patient will need inpatient services for at least 2 midnights  Expected Medical Readiness: 02/23/2023          B Medical/Surgery History Past Medical History:  Diagnosis Date   Anxiety    Arthritis    "back, arms, legs" (03/24/2016)   Asthma    Chronic lower back pain    Colonic polyp    last colonoscopy done in 2009 with  normal results per medical record   Depressive disorder    Gastric polyp    Gout    has taken allopurinol 300mg  once daily in past   Headache    History of hiatal hernia    Hypertension    Migraine    "none in awhile; might have a couple/year" (03/24/2016)   Mixed hyperlipidemia    01/2016 Total chol 141, HDL 59, LDL 63, ration 1.1   Osteoarthritis    TIA (transient ischemic attack) 11/2014   Type II diabetes mellitus (HCC)    Past Surgical History:  Procedure Laterality Date   ARTERY BIOPSY Right 08/12/2016   Procedure: BIOPSY TEMPORAL ARTERY;  Surgeon: Sherren Kerns, MD;  Location: [facility] OR;  Service: Vascular;  Laterality: Right;  BIOPSY TEMPORAL ARTERY   BREAST BIOPSY Left ~ 2015   benign   CARPAL TUNNEL RELEASE Bilateral    DILATION AND CURETTAGE OF UTERUS     I & D EXTREMITY Bilateral 10/24/2022   Procedure: DEBRIDEMENT LEFT LEG AND RIGHT ANKLE;  Surgeon: Nadara Mustard, MD;  Location: [facility] OR;  Service: Orthopedics;  Laterality: Bilateral;   IR CT HEAD LTD  06/11/2022   IR PERCUTANEOUS ART THROMBECTOMY/INFUSION INTRACRANIAL INC DIAG ANGIO  06/11/2022   IR US GUIDE VASC ACCESS RIGHT  06/11/2022   KNEE ARTHROSCOPY Right 2003   in [facility]   LAPAROSCOPIC CHOLECYSTECTOMY     LOOP RECORDER INSERTION N/A 06/26/2022   Procedure: LOOP RECORDER INSERTION;  Surgeon: Regan Lemming, MD;  [facility] INVASIVE CV LAB;  Service: Cardiovascular;  Laterality: N/A;   RADIOLOGY WITH ANESTHESIA N/A 06/11/2022   Procedure: IR WITH ANESTHESIA;  Surgeon: Radiologist, Medication, MD;  Location: [facility] OR;  Service: Radiology;  Laterality: N/A;   TUBAL LIGATION     VAGINAL HYSTERECTOMY  1982     A IV Location/Drains/Wounds Patient Lines/Drains/Airways Status     Active Line/Drains/Airways     Name Placement date Placement time Site Days   Peripheral IV 02/17/23 20 G Left Antecubital 02/17/23  2126  Antecubital  1   Wound / Incision (Open or Dehisced) 06/11/22 Venous stasis ulcer Foot  Anterior;Right 06/11/22  1428  Foot  252   Wound / Incision (Open or Dehisced) 06/11/22 Venous stasis ulcer Ankle Anterior;Right 06/11/22  1630  Ankle  252   Wound / Incision (Open or Dehisced) 06/11/22 Venous stasis ulcer Foot Anterior;Left 06/11/22  1428  Foot  252   Wound / Incision (Open or Dehisced) 06/11/22 Venous stasis ulcer Foot Anterior;Left;Medial 06/11/22  1428  Foot  252   Wound / Incision (Open or Dehisced) 06/12/22 Venous stasis ulcer;Diabetic ulcer Ankle Left;Posterior 06/12/22  0800  Ankle  251            Intake/Output Last 24 hours  Intake/Output Summary (Last 24 hours) at 02/18/2023 0711 Last data filed at 02/17/2023 2256 Gross per 24 hour  Intake 1050 ml  Output --  Net 1050 ml    Labs/Imaging Results for orders placed or performed during the hospital encounter of 02/17/23 (from the past 48 hour(s))  CBC with Differential     Status: Abnormal   Collection Time: 02/17/23  9:18 PM  Result Value Ref Range   WBC 10.8 (H) 4.0 - 10.5 K/uL   RBC 4.14 3.87 - 5.11 MIL/uL   Hemoglobin 11.2 (L) 12.0 - 15.0 g/dL   HCT 81.0 (L) 17.5 - 10.2 %   MCV 86.7 80.0 - 100.0 fL   MCH 27.1 26.0 - 34.0 pg   MCHC 31.2 30.0 - 36.0 g/dL   RDW 58.5 (H) 27.7 - 82.4 %   Platelets 336 150 - 400 K/uL   nRBC 0.0 0.0 - 0.2 %   Neutrophils Relative % 89 %   Neutro Abs 9.6 (H) 1.7 - 7.7 K/uL   Lymphocytes Relative 7 %   Lymphs Abs 0.7 0.7 - 4.0 K/uL   Monocytes Relative 4 %   Monocytes Absolute 0.4 0.1 - 1.0 K/uL   Eosinophils Relative 0 %   Eosinophils Absolute 0.0 0.0 - 0.5 K/uL   Basophils Relative 0 %   Basophils Absolute 0.0 0.0 - 0.1 K/uL   Immature Granulocytes 0 %   Abs Immature Granulocytes 0.03 0.00 - 0.07 K/uL    Comment: Performed at [facility], [address], [place], [place] [address]  Comprehensive metabolic panel     Status: Abnormal   Collection Time: 02/17/23  9:18 PM  Result Value Ref Range   Sodium 138 135 - 145 mmol/L   Potassium 3.6 3.5 - 5.1 mmol/L    Chloride 104 98 - 111 mmol/L   CO2 19 (L) 22 - 32 mmol/L   Glucose, Bld 177 (H) 70 - 99 mg/dL    Comment: Glucose reference range applies only to samples taken after fasting for at least 8 hours.   BUN 7 (L) 8 - 23 mg/dL   Creatinine, Ser 1.44 0.44 - 1.00 mg/dL   Calcium 9.6 8.9 -  10.3 mg/dL   Total Protein 8.5 (H) 6.5 - 8.1 g/dL   Albumin 2.7 (L) 3.5 - 5.0 g/dL   AST 13 (L) 15 - 41 U/L   ALT 11 0 - 44 U/L   Alkaline Phosphatase 92 38 - 126 U/L   Total Bilirubin 0.7 0.3 - 1.2 mg/dL   GFR, Estimated >16 >10 mL/min    Comment: (NOTE) Calculated using the CKD-EPI Creatinine Equation (2021)    Anion gap 15 5 - 15    Comment: Performed at [facility], [address], [place], [place] [address]  Lipase, blood     Status: None   Collection Time: 02/17/23  9:18 PM  Result Value Ref Range   Lipase 23 11 - 51 U/L    Comment: Performed at [facility], [address], [place], [place] [address]  I-Stat CG4 Lactic Acid     Status: None   Collection Time: 02/18/23 12:02 AM  Result Value Ref Range   Lactic Acid, Venous 1.3 0.5 - 1.9 mmol/L  CBG monitoring, ED     Status: None   Collection Time: 02/18/23  6:43 AM  Result Value Ref Range   Glucose-Capillary 98 70 - 99 mg/dL    Comment: Glucose reference range applies only to samples taken after fasting for at least 8 hours.   Comment 1 Document in Chart    CT ABDOMEN PELVIS W CONTRAST  Result Date: 02/17/2023 CLINICAL DATA:  Nausea, dry heaving, and stomach pain starting today EXAM: CT ABDOMEN AND PELVIS WITH CONTRAST TECHNIQUE: Multidetector CT imaging of the abdomen and pelvis was performed using the standard protocol following bolus administration of intravenous contrast. RADIATION DOSE REDUCTION: This exam was performed according to the departmental dose-optimization program which includes automated exposure control, adjustment of the mA and/or kV according to patient size and/or use of iterative reconstruction technique.  CONTRAST:  75mL OMNIPAQUE IOHEXOL 350 MG/ML SOLN COMPARISON:  None Available. FINDINGS: Lower chest: No acute abnormality. Hepatobiliary: Cholecystectomy. Unremarkable liver and biliary tree. Pancreas: Unremarkable. Spleen: Unremarkable. Adrenals/Urinary Tract: Unremarkable adrenal glands. No urinary calculi or hydronephrosis. Unremarkable bladder. Stomach/Bowel: Distended loops of small bowel in the left hemiabdomen measuring up to 29 mm. There is tapering to the entry into the left ventral abdominal wall hernia. Normal caliber colon. No bowel wall thickening. Colonic diverticulosis without diverticulitis. Normal appendix. Stomach is within normal limits. Vascular/Lymphatic: Normal caliber aorta. Indeterminate prominent retroperitoneal and retrocrural lymph nodes measuring up to 9 mm on series 3/image 9 are favored reactive. Reproductive: Hysterectomy.  No adnexal mass. Other: Mesenteric edema. No free intraperitoneal air. Small bowel containing left ventral abdominal wall hernia. Musculoskeletal: No acute fracture. IMPRESSION: Findings suspicious for partial or early small-bowel obstruction with transition point at the entry into the left ventral abdominal wall hernia. Electronically Signed   By: Minerva Fester M.D.   On: 02/17/2023 23:50   CT HEAD WO CONTRAST ( )  Result Date: 02/17/2023 CLINICAL DATA:  Neuro deficit, acute, stroke suspected EXAM: CT HEAD WITHOUT CONTRAST TECHNIQUE: Contiguous axial images were obtained from the base of the skull through the vertex without intravenous contrast. RADIATION DOSE REDUCTION: This exam was performed according to the departmental dose-optimization program which includes automated exposure control, adjustment of the mA and/or kV according to patient size and/or use of iterative reconstruction technique. COMPARISON:  MRI head 10/25/2022 FINDINGS: Brain: Patchy and confluent areas of decreased attenuation are noted throughout the deep and periventricular white matter  of the cerebral hemispheres bilaterally,  compatible with chronic microvascular ischemic disease. 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 No evidence of large-territorial acute infarction. No parenchymal hemorrhage. No mass lesion. No extra-axial collection. No mass effect or midline shift. No hydrocephalus. Basilar cisterns are patent. Vascular: No hyperdense vessel. Skull: No acute fracture or focal lesion. Sinuses/Orbits: Paranasal sinuses and mastoid air cells are clear. The orbits are unremarkable. Other: None. IMPRESSION: No acute intracranial abnormality. Electronically Signed   By: Tish Frederickson M.D.   On: 02/17/2023 23:41   DG Chest Port 1 View  Result Date: 02/17/2023 CLINICAL DATA:  Altered mental status, nausea, and abdominal pain. Suspected bilateral foreleg cellulitis. [id number]. EXAM: PORTABLE CHEST 1 VIEW PORTABLE RIGHT TIBIA AND FIBULA - 2 VIEW PORTABLE LEFT TIBIA AND FIBULA - 2 VIEW COMPARISON:  PA and lateral chest 07/16/2022, left tibia fibula series 07/20/2019, right tibia fibula series 03/16/2017 FINDINGS: CHEST AP PORTABLE 9:10 P.M.: Loop recorder device left chest wall and multiple overlying monitor wires. The heart is enlarged but unchanged. No vascular congestion is seen. No substantial pleural effusion. The lungs are clear. Degenerative change and slight dextroscoliosis thoracic spine with no new osseous findings. AP LAT RIGHT TIBIA FIBULA: There is mild generalized edema. The edema was greater in 2018 than now. Interval increased multifocal soft tissue calcifications are noted most likely related to venous stasis. There is mild osteopenia with no evidence of fractures or acute osteomyelitis. There is apparent femorotibial degenerative arthrosis. Mild arthrosis of the ankle. AP LATERAL LEFT TIBIA FIBULA: Mild-to-moderate generalized edema. There are increased multifocal soft tissue calcifications most likely due to venous stasis. Irregularity along the mid pretibial skin  surface could indicate areas of ulceration. This is also seen posteriorly in the mid foreleg. No foreign body or soft tissue gas are identified. Underlying bones are mildly osteopenic but intact. There is advanced femorotibial degenerative arthrosis with mild arthrosis of the ankle. Small plantar and dorsal calcaneal spurs. IMPRESSION: 1. No evidence of acute chest disease. 2. Cardiomegaly. 3. Interval increased multifocal soft tissue calcifications both forelegs most likely due to venous stasis. 4. Left-greater-than-right soft tissue edema in the forelegs. 5. Irregularity along the left mid pretibial and posterior skin surface which could indicate areas of ulceration. 6. No evidence of fractures or acute osteomyelitis in either foreleg. 7. Advanced arthrosis of both knees. Electronically Signed   By: Almira Bar M.D.   On: 02/17/2023 22:37   DG Tibia/Fibula Left Port  Result Date: 02/17/2023 CLINICAL DATA:  Altered mental status, nausea, and abdominal pain. Suspected bilateral foreleg cellulitis. [id number]. EXAM: PORTABLE CHEST 1 VIEW PORTABLE RIGHT TIBIA AND FIBULA - 2 VIEW PORTABLE LEFT TIBIA AND FIBULA - 2 VIEW COMPARISON:  PA and lateral chest 07/16/2022, left tibia fibula series 07/20/2019, right tibia fibula series 03/16/2017 FINDINGS: CHEST AP PORTABLE 9:10 P.M.: Loop recorder device left chest wall and multiple overlying monitor wires. The heart is enlarged but unchanged. No vascular congestion is seen. No substantial pleural effusion. The lungs are clear. Degenerative change and slight dextroscoliosis thoracic spine with no new osseous findings. AP LAT RIGHT TIBIA FIBULA: There is mild generalized edema. The edema was greater in 2018 than now. Interval increased multifocal soft tissue calcifications are noted most likely related to venous stasis. There is mild osteopenia with no evidence of fractures or acute osteomyelitis. There is apparent femorotibial degenerative arthrosis. Mild arthrosis of the  ankle. AP LATERAL LEFT TIBIA FIBULA: Mild-to-moderate generalized edema. There are increased multifocal soft tissue calcifications most likely due to venous stasis. Irregularity along  the mid pretibial skin surface could indicate areas of ulceration. This is also seen posteriorly in the mid foreleg. No foreign body or soft tissue gas are identified. Underlying bones are mildly osteopenic but intact. There is advanced femorotibial degenerative arthrosis with mild arthrosis of the ankle. Small plantar and dorsal calcaneal spurs. IMPRESSION: 1. No evidence of acute chest disease. 2. Cardiomegaly. 3. Interval increased multifocal soft tissue calcifications both forelegs most likely due to venous stasis. 4. Left-greater-than-right soft tissue edema in the forelegs. 5. Irregularity along the left mid pretibial and posterior skin surface which could indicate areas of ulceration. 6. No evidence of fractures or acute osteomyelitis in either foreleg. 7. Advanced arthrosis of both knees. Electronically Signed   By: Almira Bar M.D.   On: 02/17/2023 22:37   DG Tibia/Fibula Right Port  Result Date: 02/17/2023 CLINICAL DATA:  Altered mental status, nausea, and abdominal pain. Suspected bilateral foreleg cellulitis. [id number]. EXAM: PORTABLE CHEST 1 VIEW PORTABLE RIGHT TIBIA AND FIBULA - 2 VIEW PORTABLE LEFT TIBIA AND FIBULA - 2 VIEW COMPARISON:  PA and lateral chest 07/16/2022, left tibia fibula series 07/20/2019, right tibia fibula series 03/16/2017 FINDINGS: CHEST AP PORTABLE 9:10 P.M.: Loop recorder device left chest wall and multiple overlying monitor wires. The heart is enlarged but unchanged. No vascular congestion is seen. No substantial pleural effusion. The lungs are clear. Degenerative change and slight dextroscoliosis thoracic spine with no new osseous findings. AP LAT RIGHT TIBIA FIBULA: There is mild generalized edema. The edema was greater in 2018 than now. Interval increased multifocal soft tissue calcifications  are noted most likely related to venous stasis. There is mild osteopenia with no evidence of fractures or acute osteomyelitis. There is apparent femorotibial degenerative arthrosis. Mild arthrosis of the ankle. AP LATERAL LEFT TIBIA FIBULA: Mild-to-moderate generalized edema. There are increased multifocal soft tissue calcifications most likely due to venous stasis. Irregularity along the mid pretibial skin surface could indicate areas of ulceration. This is also seen posteriorly in the mid foreleg. No foreign body or soft tissue gas are identified. Underlying bones are mildly osteopenic but intact. There is advanced femorotibial degenerative arthrosis with mild arthrosis of the ankle. Small plantar and dorsal calcaneal spurs. IMPRESSION: 1. No evidence of acute chest disease. 2. Cardiomegaly. 3. Interval increased multifocal soft tissue calcifications both forelegs most likely due to venous stasis. 4. Left-greater-than-right soft tissue edema in the forelegs. 5. Irregularity along the left mid pretibial and posterior skin surface which could indicate areas of ulceration. 6. No evidence of fractures or acute osteomyelitis in either foreleg. 7. Advanced arthrosis of both knees. Electronically Signed   By: Almira Bar M.D.   On: 02/17/2023 22:37    Pending Labs Unresulted Labs (From admission, onward)     Start     Ordered   02/19/23 0500  CBC  Daily,   R      02/18/23 0655   02/19/23 0500  Comprehensive metabolic panel  Daily,   R      02/18/23 0655   02/17/23 2044  Blood culture (routine x 2)  BLOOD CULTURE X 2,   R (with STAT occurrences)      02/17/23 2043            Vitals/Pain Today's Vitals   02/18/23 0545 02/18/23 0548 02/18/23 0600 02/18/23 0630  BP: 130/71  (!) 155/72 138/70  Pulse:      Resp: 18  18 (!) 21  Temp:      TempSrc:  SpO2:      Weight:      Height:      PainSc:  Asleep      Isolation Precautions No active isolations  Medications Medications   HYDROcodone-acetaminophen (NORCO) 10-325 MG per tablet 1 tablet (has no administration in time range)  enoxaparin (LOVENOX) injection 60 mg (has no administration in time range)  sodium chloride flush (NS) 0.9 % injection 3 mL (has no administration in time range)  sodium chloride flush (NS) 0.9 % injection 3 mL (has no administration in time range)  0.9 %  sodium chloride infusion (has no administration in time range)  ondansetron (ZOFRAN) tablet 4 mg (has no administration in time range)    Or  ondansetron (ZOFRAN) injection 4 mg (has no administration in time range)  hydrALAZINE (APRESOLINE) injection 10 mg (has no administration in time range)  lactated ringers infusion ( Intravenous New Bag/Given 02/18/23 0641)  aspirin EC tablet 81 mg (has no administration in time range)  atorvastatin (LIPITOR) tablet 20 mg (has no administration in time range)  donepezil (ARICEPT) tablet 10 mg (has no administration in time range)  lurasidone (LATUDA) tablet 40 mg (has no administration in time range)  dextrose 50 % solution 50 mL (has no administration in time range)  albuterol (PROVENTIL) (2.5 MG/3ML) 0.083% nebulizer solution 2.5 mg (has no administration in time range)  sodium chloride 0.9 % bolus 1,000 mL (0 mLs Intravenous Stopped 02/17/23 2256)  ondansetron (ZOFRAN) injection 4 mg (4 mg Intravenous Given 02/17/23 2128)  piperacillin-tazobactam (ZOSYN) IVPB 3.375 g (0 g Intravenous Stopped 02/17/23 2224)  iohexol (OMNIPAQUE) 350 MG/ML injection 75 mL (75 mLs Intravenous Contrast Given 02/17/23 2330)  vancomycin (VANCOREADY) IVPB 2000 mg/400 mL (0 mg Intravenous Stopped 02/18/23 0404)    Mobility non-ambulatory     Focused Assessments Abdominal/vascular   R Recommendations: See Admitting Provider Note  Report given to:   Additional Notes:

## 2023-02-18 NOTE — H&P (Addendum)
History and Physical    [name] ZOX:[id number] DOB: 04-15-1951 DOA: 02/17/2023  PCP: [name]   Patient coming from: Home   Chief Complaint:  Chief Complaint  Patient presents with   Abdominal Pain    HPI:  [name] is a 72 y.o. female with medical history significant of significant for essential hypertension, hyperlipidemia, history of stroke, insulin-dependent DM type II, chronic anxiety, chronic depression, bipolar disorder, osteoarthritis, chronic lower back pain, gout, mild dementia, chronic bilateral lower extremity wound, venous stasis ulcer and morbid obesity presented to emergency department complaining of evaluation for abdominal pain, nausea, vomiting and poor appetite.  Son at the bedside reported that patient has very poor appetite and she lost around 40 pounds over the last month and 70 pounds over the last couple of months. Patient reported that since yesterday she has some abdominal pain and she had multiple episodes of vomiting and was unable to tolerate any oral food for last 24 hours.  She is also complaining about nausea.  Patient denies any symptom of fever, chill, headache, blurry vision, recent sickness, sore throat, cough, congestion, chest pain, shortness of breath.  She does ambulation with wheelchair.  Patient has chronic bilateral lower extremities wound and has last wound care visit 6 weeks ago.  Not currently changing lower extremity dressings and not taking appropriate care.    ED Course:  At presentation to ED patient is hemodynamically stable.  CBC showed leukocytosis 10.8, stable H&H 11.2 and 35 and platelet 336. CMP grossly unremarkable except low bicarb 19, elevated blood glucose 117, BUN 7, low protein 2.7. Lactic acid 1.3.  Blood culture x 2 in process.  X-ray chest and tibia-fibula of the bilateral foot: IMPRESSION: 1. No evidence of acute chest disease. 2. Cardiomegaly. 3. Interval increased multifocal soft tissue  calcifications both forelegs most likely due to venous stasis. 4. Left-greater-than-right soft tissue edema in the forelegs. 5. Irregularity along the left mid pretibial and posterior skin surface which could indicate areas of ulceration. 6. No evidence of fractures or acute osteomyelitis in either foreleg. 7. Advanced arthrosis of both knees.  CT no acute intracranial process.  CT abdomen pelvis showed suspicion for partial or small bowel obstruction with transition point at the entry into the left ventral abdomen and wall hernia.  ED physician stated that patient sees outpatient orthopedic surgeon Dr. Lajoyce Corners several months ago. ED physician discussed case to with orthopedic surgeon Dr. Steward Drone who recommended that he will mention Dr. Lajoyce Corners to see the patient in the morning and recommend continued Brotzman antibiotic.  I have consulted general surgery Dr. Derrell Lolling for evaluation for small bowel obstruction.  General surgery will evaluate patient soon. In the ED patient has been treated with IV antibiotic Vanco and Zosyn.   Review of Systems:  Review of Systems  Constitutional:  Positive for weight loss. Negative for chills, fever and malaise/fatigue.  Respiratory:  Negative for cough, hemoptysis, sputum production and shortness of breath.   Cardiovascular:  Negative for chest pain, palpitations, claudication and leg swelling.  Gastrointestinal:  Positive for abdominal pain, nausea and vomiting. Negative for diarrhea and heartburn.  Musculoskeletal:  Negative for myalgias.  Skin:  Negative for itching and rash.  Neurological:  Negative for dizziness and headaches.  Psychiatric/Behavioral:  The patient is not nervous/anxious.     Past Medical History:  Diagnosis Date   Anxiety    Arthritis    "back, arms, legs" (03/24/2016)   Asthma    Chronic lower  back pain    Colonic polyp    last colonoscopy done in 2009 with normal results per medical record   Depressive disorder    Gastric  polyp    Gout    has taken allopurinol 300mg  once daily in past   Headache    History of hiatal hernia    Hypertension    Migraine    "none in awhile; might have a couple/year" (03/24/2016)   Mixed hyperlipidemia    01/2016 Total chol 141, HDL 59, LDL 63, ration 1.1   Osteoarthritis    TIA (transient ischemic attack) 11/2014   Type II diabetes mellitus (HCC)     Past Surgical History:  Procedure Laterality Date   ARTERY BIOPSY Right 08/12/2016   Procedure: BIOPSY TEMPORAL ARTERY;  Surgeon: Sherren Kerns, MD;  Location: [facility] OR;  Service: Vascular;  Laterality: Right;  BIOPSY TEMPORAL ARTERY   BREAST BIOPSY Left ~ 2015   benign   CARPAL TUNNEL RELEASE Bilateral    DILATION AND CURETTAGE OF UTERUS     I & D EXTREMITY Bilateral 10/24/2022   Procedure: DEBRIDEMENT LEFT LEG AND RIGHT ANKLE;  Surgeon: Nadara Mustard, MD;  Location: [facility] OR;  Service: Orthopedics;  Laterality: Bilateral;   IR CT HEAD LTD  06/11/2022   IR PERCUTANEOUS ART THROMBECTOMY/INFUSION INTRACRANIAL INC DIAG ANGIO  06/11/2022   IR US GUIDE VASC ACCESS RIGHT  06/11/2022   KNEE ARTHROSCOPY Right 2003   in [facility]   LAPAROSCOPIC CHOLECYSTECTOMY     LOOP RECORDER INSERTION N/A 06/26/2022   Procedure: LOOP RECORDER INSERTION;  Surgeon: Regan Lemming, MD;  Location: [facility] INVASIVE CV LAB;  Service: Cardiovascular;  Laterality: N/A;   RADIOLOGY WITH ANESTHESIA N/A 06/11/2022   Procedure: IR WITH ANESTHESIA;  Surgeon: Radiologist, Medication, MD;  Location: [facility] OR;  Service: Radiology;  Laterality: N/A;   TUBAL LIGATION     VAGINAL HYSTERECTOMY  1982     reports that she has never smoked. She has never used smokeless tobacco. She reports that she does not drink alcohol and does not use drugs.  Allergies  Allergen Reactions   Ace Inhibitors Swelling   Latex Itching and Swelling   Tizanidine Other (See Comments)    Tremors    Ultram [Tramadol] Nausea And Vomiting   Diprivan [Propofol] Itching   Metformin And Related  Other (See Comments)    Tremors  Chills    Family History  Problem Relation Age of Onset   Stroke Father    Stroke Brother    Cancer Other    Gout Mother    Hypertension Mother    Arthritis Mother    Asthma Mother    Stroke Brother     Prior to Admission medications   Medication Sig Start Date End Date Taking? Authorizing Provider  acetaminophen (TYLENOL) 325 MG tablet Take 2 tablets (650 mg total) by mouth every 6 (six) hours as needed for mild pain (or Fever >/= 101). 06/28/22  Yes Elgergawy, Leana Roe, MD  allopurinol (ZYLOPRIM) 100 MG tablet Take 100 mg by mouth daily. 01/29/23  Yes [provider]  aspirin EC 81 MG tablet Take 81 mg by mouth daily. Swallow whole.   Yes [provider]  atenolol (TENORMIN) 25 MG tablet Take 25-50 mg by mouth See admin instructions. 50 mg every morning, 25 mg at bedtime   Yes [provider]  atorvastatin (LIPITOR) 20 MG tablet Take 20 mg by mouth daily. 01/29/23  Yes [provider]  cyclobenzaprine (FLEXERIL) 5 MG tablet Take 5 mg by mouth 3 (three) times daily as needed for muscle spasms. 06/03/22  Yes [provider]  donepezil (ARICEPT) 10 MG tablet Take 10 mg by mouth in the morning.   Yes [provider]  gabapentin (NEURONTIN) 300 MG capsule Take 1 capsule (300 mg total) by mouth 2 (two) times daily. 10/29/22  Yes Sheikh, Omair [name], DO  HYDROcodone-acetaminophen (NORCO) 10-325 MG tablet Take 1 tablet by mouth 2 (two) times daily as needed for severe pain. 02/05/23  Yes [provider]  insulin glargine (LANTUS) 100 UNIT/ML Solostar Pen Inject 30 Units into the skin every morning. And pen needles 1/day 10/29/22  Yes Sheikh, Omair [name], DO  leptospermum manuka honey (MEDIHONEY) PSTE paste Apply 1 Application topically daily. 10/30/22  Yes Sheikh, Omair [name], DO  lurasidone (LATUDA) 40 MG TABS tablet Take 1 tablet (40 mg total) by mouth daily with breakfast. 06/16/22  Yes Bobbye Morton,  MD  mineral oil-hydrophilic petrolatum (AQUAPHOR) ointment Apply topically daily. 10/30/22  Yes Sheikh, Omair [name], DO  mupirocin ointment (BACTROBAN) 2 % Place 1 Application into the nose 2 (two) times daily. 10/29/22  Yes Sheikh, Omair [name], DO  NIFEdipine (PROCARDIA XL/NIFEDICAL-XL) 90 MG 24 hr tablet Take 90 mg by mouth in the morning.   Yes [provider]  ondansetron (ZOFRAN) 4 MG tablet Take 1 tablet (4 mg total) by mouth every 6 (six) hours. 07/16/22  Yes Prosperi, Christian H, PA-C  ondansetron (ZOFRAN) 4 MG tablet Take 1 tablet (4 mg total) by mouth every 6 (six) hours as needed for nausea. 10/29/22  Yes Sheikh, Omair [name], DO  polyethylene glycol powder (GLYCOLAX/MIRALAX) 17 GM/SCOOP powder Take 17 g by mouth daily as needed for mild constipation. 10/29/22  Yes Marguerita Merles [place], [place]     Physical Exam: Vitals:   02/18/23 0416 02/18/23 0545 02/18/23 0600 02/18/23 0630  BP: 113/63 130/71 (!) 155/72 138/70  Pulse: 65     Resp: 16 18 18  (!) 21  Temp: (!) 97.5 F (36.4 C)     TempSrc: Oral     SpO2: 95%     Weight:      Height:        Physical Exam Constitutional:      Appearance: She is obese.  Cardiovascular:     Rate and Rhythm: Normal rate and regular rhythm.     Heart sounds: Normal heart sounds.  Pulmonary:     Effort: Pulmonary effort is normal.     Breath sounds: Normal breath sounds.  Abdominal:     General: Bowel sounds are decreased. There is no distension.     Palpations: Abdomen is soft. There is no hepatomegaly or mass.     Tenderness: There is no abdominal tenderness. There is no guarding.  Skin:    Capillary Refill: Capillary refill takes less than 2 seconds.     Comments: Bilateral lower extremity chronic venous insufficiency wound  Neurological:     Mental Status: She is alert and oriented to person, place, and time.      Media Information   Document Information  Photos  Right foot  02/17/2023 21:35  Attached To:  Hospital  Encounter on 02/17/23  Source Information  Jeral Pinch, DO  [facility]  Document History     Labs on Admission: I have personally reviewed following labs and imaging studies   Media Information   Document Information  Photos  Left leg  02/17/2023 21:34  Attached To:  Hospital Encounter on 02/17/23  Source Information  Jeral Pinch, DO  [facility]  Document History     Media Information   Document Information  Photos  Left leg, maggots  02/17/2023 21:35  Attached To:  Hospital Encounter on 02/17/23  Source Information  Jeral Pinch, DO  [facility]  Document History      CBC: Recent Labs  Lab 02/17/23 2118  WBC 10.8*  NEUTROABS 9.6*  HGB 11.2*  HCT 35.9*  MCV 86.7  PLT 336   Basic Metabolic Panel: Recent Labs  Lab 02/17/23 2118  NA 138  K 3.6  CL 104  CO2 19*  GLUCOSE 177*  BUN 7*  CREATININE 0.83  CALCIUM 9.6   GFR: Estimated Creatinine Clearance: 81.2 mL/min (by C-G formula based on SCr of 0.83 mg/dL). Liver Function Tests: Recent Labs  Lab 02/17/23 2118  AST 13*  ALT 11  ALKPHOS 92  BILITOT 0.7  PROT 8.5*  ALBUMIN 2.7*   Recent Labs  Lab 02/17/23 2118  LIPASE 23   No results for input(s): "AMMONIA" in the last 168 hours. Coagulation Profile: No results for input(s): "INR", "PROTIME" in the last 168 hours. Cardiac Enzymes: No results for input(s): "CKTOTAL", "CKMB", "CKMBINDEX", "TROPONINI", "TROPONINIHS" in the last 168 hours. BNP (last 3 results) No results for input(s): "BNP" in the last 8760 hours. HbA1C: No results for input(s): "HGBA1C" in the last 72 hours. CBG: Recent Labs  Lab 02/18/23 0643  GLUCAP 98   Lipid Profile: No results for input(s): "CHOL", "HDL", "LDLCALC", "TRIG", "CHOLHDL", "LDLDIRECT" in the last 72 hours. Thyroid Function Tests: No results for input(s): "TSH", "T4TOTAL", "FREET4", "T3FREE", "THYROIDAB" in the last 72 hours. Anemia Panel: No results for  input(s): "VITAMINB12", "FOLATE", "FERRITIN", "TIBC", "IRON", "RETICCTPCT" in the last 72 hours. Urine analysis:    Component Value Date/Time   COLORURINE YELLOW 06/25/2022 1230   APPEARANCEUR CLEAR 06/25/2022 1230   LABSPEC 1.005 06/25/2022 1230   PHURINE 7.0 06/25/2022 1230   GLUCOSEU NEGATIVE 06/25/2022 1230   HGBUR NEGATIVE 06/25/2022 1230   BILIRUBINUR NEGATIVE 06/25/2022 1230   KETONESUR NEGATIVE 06/25/2022 1230   PROTEINUR NEGATIVE 06/25/2022 1230   NITRITE POSITIVE (A) 06/25/2022 1230   LEUKOCYTESUR NEGATIVE 06/25/2022 1230    Radiological Exams on Admission: I have personally reviewed images CT ABDOMEN PELVIS W CONTRAST  Result Date: 02/17/2023 CLINICAL DATA:  Nausea, dry heaving, and stomach pain starting today EXAM: CT ABDOMEN AND PELVIS WITH CONTRAST TECHNIQUE: Multidetector CT imaging of the abdomen and pelvis was performed using the standard protocol following bolus administration of intravenous contrast. RADIATION DOSE REDUCTION: This exam was performed according to the departmental dose-optimization program which includes automated exposure control, adjustment of the mA and/or kV according to patient size and/or use of iterative reconstruction technique. CONTRAST:  75mL OMNIPAQUE IOHEXOL 350 MG/ML SOLN COMPARISON:  None Available. FINDINGS: Lower chest: No acute abnormality. Hepatobiliary: Cholecystectomy. Unremarkable liver and biliary tree. Pancreas: Unremarkable. Spleen: Unremarkable. Adrenals/Urinary Tract: Unremarkable adrenal glands. No urinary calculi or hydronephrosis. Unremarkable bladder. Stomach/Bowel: Distended loops of small bowel in the left hemiabdomen measuring up to 29 mm. There is tapering to the entry into the left ventral abdominal wall hernia. Normal caliber colon. No bowel wall thickening. Colonic diverticulosis without diverticulitis. Normal appendix. Stomach is within normal limits. Vascular/Lymphatic: Normal caliber aorta. Indeterminate prominent  retroperitoneal and retrocrural lymph nodes measuring up to 9 mm on series 3/image 9 are favored reactive. Reproductive: Hysterectomy.  No adnexal mass. Other: Mesenteric edema. No  free intraperitoneal air. Small bowel containing left ventral abdominal wall hernia. Musculoskeletal: No acute fracture. IMPRESSION: Findings suspicious for partial or early small-bowel obstruction with transition point at the entry into the left ventral abdominal wall hernia. Electronically Signed   By: Minerva Fester M.D.   On: 02/17/2023 23:50   CT HEAD WO CONTRAST ( )  Result Date: 02/17/2023 CLINICAL DATA:  Neuro deficit, acute, stroke suspected EXAM: CT HEAD WITHOUT CONTRAST TECHNIQUE: Contiguous axial images were obtained from the base of the skull through the vertex without intravenous contrast. RADIATION DOSE REDUCTION: This exam was performed according to the departmental dose-optimization program which includes automated exposure control, adjustment of the mA and/or kV according to patient size and/or use of iterative reconstruction technique. COMPARISON:  MRI head 10/25/2022 FINDINGS: Brain: Patchy and confluent areas of decreased attenuation are noted throughout the deep and periventricular white matter of the cerebral hemispheres bilaterally, compatible with chronic microvascular ischemic disease. 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 No evidence of large-territorial acute infarction. No parenchymal hemorrhage. No mass lesion. No extra-axial collection. No mass effect or midline shift. No hydrocephalus. Basilar cisterns are patent. Vascular: No hyperdense vessel. Skull: No acute fracture or focal lesion. Sinuses/Orbits: Paranasal sinuses and mastoid air cells are clear. The orbits are unremarkable. Other: None. IMPRESSION: No acute intracranial abnormality. Electronically Signed   By: Tish Frederickson M.D.   On: 02/17/2023 23:41   DG Chest Port 1 View  Result Date: 02/17/2023 CLINICAL DATA:   Altered mental status, nausea, and abdominal pain. Suspected bilateral foreleg cellulitis. [id number]. EXAM: PORTABLE CHEST 1 VIEW PORTABLE RIGHT TIBIA AND FIBULA - 2 VIEW PORTABLE LEFT TIBIA AND FIBULA - 2 VIEW COMPARISON:  PA and lateral chest 07/16/2022, left tibia fibula series 07/20/2019, right tibia fibula series 03/16/2017 FINDINGS: CHEST AP PORTABLE 9:10 P.M.: Loop recorder device left chest wall and multiple overlying monitor wires. The heart is enlarged but unchanged. No vascular congestion is seen. No substantial pleural effusion. The lungs are clear. Degenerative change and slight dextroscoliosis thoracic spine with no new osseous findings. AP LAT RIGHT TIBIA FIBULA: There is mild generalized edema. The edema was greater in 2018 than now. Interval increased multifocal soft tissue calcifications are noted most likely related to venous stasis. There is mild osteopenia with no evidence of fractures or acute osteomyelitis. There is apparent femorotibial degenerative arthrosis. Mild arthrosis of the ankle. AP LATERAL LEFT TIBIA FIBULA: Mild-to-moderate generalized edema. There are increased multifocal soft tissue calcifications most likely due to venous stasis. Irregularity along the mid pretibial skin surface could indicate areas of ulceration. This is also seen posteriorly in the mid foreleg. No foreign body or soft tissue gas are identified. Underlying bones are mildly osteopenic but intact. There is advanced femorotibial degenerative arthrosis with mild arthrosis of the ankle. Small plantar and dorsal calcaneal spurs. IMPRESSION: 1. No evidence of acute chest disease. 2. Cardiomegaly. 3. Interval increased multifocal soft tissue calcifications both forelegs most likely due to venous stasis. 4. Left-greater-than-right soft tissue edema in the forelegs. 5. Irregularity along the left mid pretibial and posterior skin surface which could indicate areas of ulceration. 6. No evidence of fractures or acute  osteomyelitis in either foreleg. 7. Advanced arthrosis of both knees. Electronically Signed   By: Almira Bar M.D.   On: 02/17/2023 22:37   DG Tibia/Fibula Left Port  Result Date: 02/17/2023 CLINICAL DATA:  Altered mental status, nausea, and abdominal pain. Suspected bilateral foreleg cellulitis. [id number]. EXAM: PORTABLE CHEST 1 VIEW PORTABLE RIGHT TIBIA AND  FIBULA - 2 VIEW PORTABLE LEFT TIBIA AND FIBULA - 2 VIEW COMPARISON:  PA and lateral chest 07/16/2022, left tibia fibula series 07/20/2019, right tibia fibula series 03/16/2017 FINDINGS: CHEST AP PORTABLE 9:10 P.M.: Loop recorder device left chest wall and multiple overlying monitor wires. The heart is enlarged but unchanged. No vascular congestion is seen. No substantial pleural effusion. The lungs are clear. Degenerative change and slight dextroscoliosis thoracic spine with no new osseous findings. AP LAT RIGHT TIBIA FIBULA: There is mild generalized edema. The edema was greater in 2018 than now. Interval increased multifocal soft tissue calcifications are noted most likely related to venous stasis. There is mild osteopenia with no evidence of fractures or acute osteomyelitis. There is apparent femorotibial degenerative arthrosis. Mild arthrosis of the ankle. AP LATERAL LEFT TIBIA FIBULA: Mild-to-moderate generalized edema. There are increased multifocal soft tissue calcifications most likely due to venous stasis. Irregularity along the mid pretibial skin surface could indicate areas of ulceration. This is also seen posteriorly in the mid foreleg. No foreign body or soft tissue gas are identified. Underlying bones are mildly osteopenic but intact. There is advanced femorotibial degenerative arthrosis with mild arthrosis of the ankle. Small plantar and dorsal calcaneal spurs. IMPRESSION: 1. No evidence of acute chest disease. 2. Cardiomegaly. 3. Interval increased multifocal soft tissue calcifications both forelegs most likely due to venous stasis. 4.  Left-greater-than-right soft tissue edema in the forelegs. 5. Irregularity along the left mid pretibial and posterior skin surface which could indicate areas of ulceration. 6. No evidence of fractures or acute osteomyelitis in either foreleg. 7. Advanced arthrosis of both knees. Electronically Signed   By: Almira Bar M.D.   On: 02/17/2023 22:37   DG Tibia/Fibula Right Port  Result Date: 02/17/2023 CLINICAL DATA:  Altered mental status, nausea, and abdominal pain. Suspected bilateral foreleg cellulitis. [id number]. EXAM: PORTABLE CHEST 1 VIEW PORTABLE RIGHT TIBIA AND FIBULA - 2 VIEW PORTABLE LEFT TIBIA AND FIBULA - 2 VIEW COMPARISON:  PA and lateral chest 07/16/2022, left tibia fibula series 07/20/2019, right tibia fibula series 03/16/2017 FINDINGS: CHEST AP PORTABLE 9:10 P.M.: Loop recorder device left chest wall and multiple overlying monitor wires. The heart is enlarged but unchanged. No vascular congestion is seen. No substantial pleural effusion. The lungs are clear. Degenerative change and slight dextroscoliosis thoracic spine with no new osseous findings. AP LAT RIGHT TIBIA FIBULA: There is mild generalized edema. The edema was greater in 2018 than now. Interval increased multifocal soft tissue calcifications are noted most likely related to venous stasis. There is mild osteopenia with no evidence of fractures or acute osteomyelitis. There is apparent femorotibial degenerative arthrosis. Mild arthrosis of the ankle. AP LATERAL LEFT TIBIA FIBULA: Mild-to-moderate generalized edema. There are increased multifocal soft tissue calcifications most likely due to venous stasis. Irregularity along the mid pretibial skin surface could indicate areas of ulceration. This is also seen posteriorly in the mid foreleg. No foreign body or soft tissue gas are identified. Underlying bones are mildly osteopenic but intact. There is advanced femorotibial degenerative arthrosis with mild arthrosis of the ankle. Small plantar and  dorsal calcaneal spurs. IMPRESSION: 1. No evidence of acute chest disease. 2. Cardiomegaly. 3. Interval increased multifocal soft tissue calcifications both forelegs most likely due to venous stasis. 4. Left-greater-than-right soft tissue edema in the forelegs. 5. Irregularity along the left mid pretibial and posterior skin surface which could indicate areas of ulceration. 6. No evidence of fractures or acute osteomyelitis in either foreleg. 7. Advanced arthrosis of both knees.  Electronically Signed   By: Almira Bar M.D.   On: 02/17/2023 22:37    EKG: My personal interpretation of EKG shows: EKG shows sinus rhythm heart rate 84.  There is no ST-T wave abnormality     Assessment/Plan: Principal Problem:   Non-healing wound of lower extremity Active Problems:   Lower extremity ulceration (HCC)   History of CVA (cerebrovascular accident)   Essential hypertension   Gout   Depression   Insulin dependent type 2 diabetes mellitus (HCC)   Hyperlipidemia   SBO (small bowel obstruction) (HCC)   GAD (generalized anxiety disorder)   Asthma, chronic    Assessment and Plan: Chronic bilateral lower extremity wound and ulceration >Patient presenting with complaining of worsening bilateral lower extremities ulcer and wound. -Per chart review patient has chronic bilateral lower extremity wound and managing at outpatient wound clinic. -Physical exam showed severe ulceration. - Bilateral lower extremity x-ray showed interval increase multifocal soft tissue calcification of both forelegs most likely due to venous stasis.  Left greater than right soft tissue edema in the forelegs.  Irregularity along the left mild pretibial and posterior skin surface indicate areas of ulceration.  No evidence for osteomyelitis. -Patient has some leukocytosis.  She is afebrile. -With the concern for infection in the ED patient has been started treating with IV vancomycin and Zosyn. - ED physician consulted orthopedics  Dr. Ballard Russell who states that orthopedic Dr. Lajoyce Corners who will see in the a.m. and recommended continue broad-spectrum antibiotic.  -Team to monitor for.  Fever curve - Continue monitor CBC - Blood cultures x 2 in process. -Consulted inpatient wound care for further evaluation. -Will follow-up with orthopedic plan.  Small bowel obstruction > Patient is presenting with also complaining of abdominal pain, nausea vomiting for last 1 day.  Also she has some weight loss over the course of last few months.  Patient's son reported patient lost around 40 to 70 pound for last few months.  Patient ported she has constipation and bowel movement usually every 1 week.  Reported unable to pass gas since evening. - CT abdomen pelvis showed  suspicious for partial or early small-bowel obstruction with transition point at the entry into the left ventral abdominal wall hernia. -During my evaluation patient does not have any abdominal pain/tenderness on physical exam.  However patient has decreased bowel sounds. -Given patient does not have any acute abdominal sign holding NG tube placement now.  Consulted general surgery Dr. Derrell Lolling for further evaluation recommendation. - Currently keeping patient n.p.o. - Continue maintenance fluid LR 100 cc/h  History of hypertension -Blood pressure is well-controlled the patient has 1 episode of hypertension when blood pressure elevated to 161/92. -Holding home blood pressure regimen given patient is NPO. - Continue hydralazine 10 mg every 6 hour as as needed.  History of CVA -Patient has history of CVA in May 2024.  She completed 21 days dual antiplatelet therapy and currently on aspirin 81 mg daily - Plan to continue oral aspirin 81 mg daily and continue Lipitor  Generalized anxiety disorder Depression -Continue lurasidone 10 mg daily  Memory impairment -Continue Aricept 10 mg daily  Insulin-dependent DM type II -Per chart review most recent A1c 8.1 in 10/23/2022 -At  home patient is on Lantus 30 unit daily.  Given currently patient is n.p.o. holding insulin regimen. - Continue to check POC blood glucose 4 times daily.  Given patient is n.p.o. continue hypoglycemia protocol.   Hyperlipidemia -Continue Lipitor  Asthma - Continue albuterol as needed  DVT prophylaxis:  Lovenox Code Status: Full code. Verified and discussed CODE STATUS with patient's son Alieah Willmore, he is the healthcare power of attorney. Diet: Currently n.p.o. due to SBO Family Communication:  none Disposition Plan: Plan to discharge to home next 2 to 3 days Consults: General Surgery and orthopedics Admission status:   Inpatient, Med-Surg  Severity of Illness: The appropriate patient status for this patient is INPATIENT. Inpatient status is judged to be reasonable and necessary in order to provide the required intensity of service to ensure the patient's safety. The patient's presenting symptoms, physical exam findings, and initial radiographic and laboratory data in the context of their chronic comorbidities is felt to place them at high risk for further clinical deterioration. Furthermore, it is not anticipated that the patient will be medically stable for discharge from the hospital within 2 midnights of admission.   * I certify that at the point of admission it is my clinical judgment that the patient will require inpatient hospital care spanning beyond 2 midnights from the point of admission due to high intensity of service, high risk for further deterioration and high frequency of surveillance required.Marland Kitchen    Tereasa Coop, MD [facility]  How to contact the [facility] Attending or Consulting provider 7A - 7P or covering provider during after hours 7P -7A, for this patient.  Check the care team in [facility] and look for a) attending/consulting TRH provider listed and b) the [facility] team listed Log into [email or web address] and use [facility]'s universal password to access. If you do not have the  password, please contact the hospital operator. Locate the [facility] provider you are looking for under [facility] and page to a number that you can be directly reached. If you still have difficulty reaching the provider, please page the [facility] (Director on Call) for the Hospitalists listed on amion for assistance.  02/18/2023, 6:53 AM

## 2023-02-19 DIAGNOSIS — I890 Lymphedema, not elsewhere classified: Secondary | ICD-10-CM

## 2023-02-19 DIAGNOSIS — I872 Venous insufficiency (chronic) (peripheral): Secondary | ICD-10-CM | POA: Diagnosis not present

## 2023-02-19 DIAGNOSIS — S81802A Unspecified open wound, left lower leg, initial encounter: Secondary | ICD-10-CM | POA: Diagnosis not present

## 2023-02-19 DIAGNOSIS — L98499 Non-pressure chronic ulcer of skin of other sites with unspecified severity: Secondary | ICD-10-CM | POA: Diagnosis not present

## 2023-02-19 LAB — COMPREHENSIVE METABOLIC PANEL
ALT: 11 U/L (ref 0–44)
AST: 14 U/L — ABNORMAL LOW (ref 15–41)
Albumin: 2.7 g/dL — ABNORMAL LOW (ref 3.5–5.0)
Alkaline Phosphatase: 84 U/L (ref 38–126)
Anion gap: 10 (ref 5–15)
BUN: <5 mg/dL — ABNORMAL LOW (ref 8–23)
CO2: 22 mmol/L (ref 22–32)
Calcium: 9.5 mg/dL (ref 8.9–10.3)
Chloride: 107 mmol/L (ref 98–111)
Creatinine, Ser: 0.85 mg/dL (ref 0.44–1.00)
GFR, Estimated: >60 mL/min (ref >60)
Glucose, Bld: 126 mg/dL — ABNORMAL HIGH (ref 70–99)
Potassium: 3.6 mmol/L (ref 3.5–5.1)
Sodium: 139 mmol/L (ref 135–145)
Total Bilirubin: 0.6 mg/dL (ref 0.3–1.2)
Total Protein: 8.7 g/dL — ABNORMAL HIGH (ref 6.5–8.1)

## 2023-02-19 LAB — CBC
HCT: 41.4 % (ref 36.0–46.0)
Hemoglobin: 13 g/dL (ref 12.0–15.0)
MCH: 27.7 pg (ref 26.0–34.0)
MCHC: 31.4 g/dL (ref 30.0–36.0)
MCV: 88.1 fL (ref 80.0–100.0)
Platelets: 327 10*3/uL (ref 150–400)
RBC: 4.7 MIL/uL (ref 3.87–5.11)
RDW: 16.5 % — ABNORMAL HIGH (ref 11.5–15.5)
WBC: 5.5 10*3/uL (ref 4.0–10.5)
nRBC: 0 % (ref 0.0–0.2)

## 2023-02-19 LAB — GLUCOSE, CAPILLARY
Glucose-Capillary: 106 mg/dL — ABNORMAL HIGH (ref 70–99)
Glucose-Capillary: 129 mg/dL — ABNORMAL HIGH (ref 70–99)
Glucose-Capillary: 132 mg/dL — ABNORMAL HIGH (ref 70–99)
Glucose-Capillary: 132 mg/dL — ABNORMAL HIGH (ref 70–99)

## 2023-02-19 MED ORDER — AMOXICILLIN-POT CLAVULANATE 875-125 MG PO TABS: 1.0000 | ORAL_TABLET | Freq: Two times a day (BID) | ORAL | 0 refills | Status: AC

## 2023-02-19 MED ORDER — DOXYCYCLINE HYCLATE 50 MG PO CAPS: 100.0000 mg | ORAL_CAPSULE | Freq: Two times a day (BID) | ORAL | 0 refills | Status: AC

## 2023-02-19 NOTE — Discharge Summary (Signed)
Physician Discharge Summary   Patient: [name] MRN: [id number] DOB: [birth year]  Admit date:     02/17/2023  Discharge date: 02/19/23  Discharge Physician: Alba Cory   PCP: Hillery Aldo, NP   Recommendations at discharge:   Needs wound care daily.  Needs to follow up with DR Lajoyce Corners for further care of chronic wounds.    Discharge Diagnoses: Principal Problem:   Non-healing wound of lower extremity Active Problems:   Lower extremity ulceration (HCC)   History of CVA (cerebrovascular accident)   Essential hypertension   Gout   Depression   Insulin dependent type 2 diabetes mellitus (HCC)   Hyperlipidemia   SBO (small bowel obstruction) (HCC)   GAD (generalized anxiety disorder)   Asthma, chronic   Ulcer of extremity due to chronic venous insufficiency (HCC)   Lymphedema  Resolved Problems:   * No resolved hospital problems. *  Hospital Course: 72 year old with past medical history significant for hypertension, hyperlipidemia, history of stroke, insulin-dependent diabetes type 2, chronic anxiety, chronic depression, bipolar disorder, osteoarthritis, chronic lower back pain, gout, mild dementia, chronic bilateral lower extremity wound, venous stasis ulcer, morbid obesity presented to the ED for evaluation of abdominal pain, nausea vomiting, poor appetite.  Per son at bedside patient has had poor appetite and has 40 pounds weight loss over the last month.  CT abdomen and pelvis suspicious for partial or small bowel obstruction with transition point at the entry of the left ventral abdominal wall and wall hernia.   Patient is also noted to have chronic.  X-ray bilateral fibula tibia left greater than right soft tissue edema both forelegs, irregularity along the left mid pretibial and posterior skin surface which could indicate areas of ulceration.      Assessment and Plan: 1-Nausea vomiting.  Small bowel obstruction CT abdomen Pelvis:  suspicious for partial or  small bowel obstruction with transition point at the entry of the left ventral abdominal wall and wall hernia. Evaluated by surgery , patient might have had temporary blockage by her hernia. No further nausea or vomiting. Tolerating diet. Plan to discharge home today.   Chronic bilateral lower extremity wound and ulceration Wound care consulted Received vancomycin and zosyn.  Needs HH for wound care, per patient wound care nurse stop going to her house.  Discharge on Doxy and Augmentin for 5 days.   History of hypertension: PRN hydralazine.  Resume atenolol.    History of CVA: On aspirin and Lipitor.    Generalized anxiety disorder: Depression: On Lurasidone.    Memory impairment.  On Aricept.    Insulin dependent type II  A1c: 8.1 SSI Resume meds at discharge.   Hyperlipidemia;  On lipitor.    Asthma:  PRN albuterol.           Consultants: Surgery Dr Lajoyce Corners Procedures performed: none Disposition: Home Diet recommendation:  Carb modified diet DISCHARGE MEDICATION: Allergies as of 02/19/2023       Reactions   Ace Inhibitors Swelling   Latex Itching, Swelling   Tizanidine Other (See Comments)   Tremors    Ultram [tramadol] Nausea And Vomiting   Diprivan [propofol] Itching   Metformin And Related Other (See Comments)   Tremors  Chills        Medication List     STOP taking these medications    leptospermum manuka honey Pste paste   mineral oil-hydrophilic petrolatum ointment   mupirocin ointment 2 % Commonly known as: BACTROBAN       TAKE  these medications    acetaminophen 325 MG tablet Commonly known as: TYLENOL Take 2 tablets (650 mg total) by mouth every 6 (six) hours as needed for mild pain (or Fever >/= 101).   allopurinol 100 MG tablet Commonly known as: ZYLOPRIM Take 100 mg by mouth daily.   amoxicillin-clavulanate 875-125 MG tablet Commonly known as: AUGMENTIN Take 1 tablet by mouth 2 (two) times daily for 5 days.   aspirin  EC 81 MG tablet Take 81 mg by mouth daily. Swallow whole.   atenolol 25 MG tablet Commonly known as: TENORMIN Take 25-50 mg by mouth See admin instructions. 50 mg every morning, 25 mg at bedtime   atorvastatin 20 MG tablet Commonly known as: LIPITOR Take 20 mg by mouth daily.   cyclobenzaprine 5 MG tablet Commonly known as: FLEXERIL Take 5 mg by mouth 3 (three) times daily as needed for muscle spasms.   donepezil 10 MG tablet Commonly known as: ARICEPT Take 10 mg by mouth in the morning.   doxycycline 50 MG capsule Commonly known as: VIBRAMYCIN Take 2 capsules (100 mg total) by mouth 2 (two) times daily for 5 days.   gabapentin 300 MG capsule Commonly known as: NEURONTIN Take 1 capsule (300 mg total) by mouth 2 (two) times daily.   HYDROcodone-acetaminophen 10-325 MG tablet Commonly known as: NORCO Take 1 tablet by mouth 2 (two) times daily as needed for severe pain.   insulin glargine 100 UNIT/ML Solostar Pen Commonly known as: LANTUS Inject 30 Units into the skin every morning. And pen needles 1/day   lurasidone 40 MG Tabs tablet Commonly known as: Latuda Take 1 tablet (40 mg total) by mouth daily with breakfast.   NIFEdipine 90 MG 24 hr tablet Commonly known as: PROCARDIA XL/NIFEDICAL-XL Take 90 mg by mouth in the morning.   ondansetron 4 MG tablet Commonly known as: ZOFRAN Take 1 tablet (4 mg total) by mouth every 6 (six) hours. What changed: Another medication with the same name was removed. Continue taking this medication, and follow the directions you see here.   polyethylene glycol powder 17 GM/SCOOP powder Commonly known as: GLYCOLAX/MIRALAX Take 17 g by mouth daily as needed for mild constipation.               Discharge Care Instructions  (From admission, onward)           Start     Ordered   02/19/23 0000  Discharge wound care:       Comments: See above   02/19/23 1243            Follow-up Information     Nadara Mustard, MD  Follow up in 1 week(s).   Specialty: Orthopedic Surgery Contact information: [address] [place] [address] [phone]         Care, [facility] Follow up.   Specialty: Home Health Services Contact information: [address] [place] [address] [phone]                Discharge Exam: Ceasar Mons Weights   02/18/23 0030  Weight: 127.5 kg   General; alert  Condition at discharge: stable  The results of significant diagnostics from this hospitalization (including imaging, microbiology, ancillary and laboratory) are listed below for reference.   Imaging Studies: CT ABDOMEN PELVIS W CONTRAST  Result Date: 02/17/2023 CLINICAL DATA:  Nausea, dry heaving, and stomach pain starting today EXAM: CT ABDOMEN AND PELVIS WITH CONTRAST TECHNIQUE: Multidetector CT imaging of the abdomen  and pelvis was performed using the standard protocol following bolus administration of intravenous contrast. RADIATION DOSE REDUCTION: This exam was performed according to the departmental dose-optimization program which includes automated exposure control, adjustment of the mA and/or kV according to patient size and/or use of iterative reconstruction technique. CONTRAST:  75mL OMNIPAQUE IOHEXOL 350 MG/ML SOLN COMPARISON:  None Available. FINDINGS: Lower chest: No acute abnormality. Hepatobiliary: Cholecystectomy. Unremarkable liver and biliary tree. Pancreas: Unremarkable. Spleen: Unremarkable. Adrenals/Urinary Tract: Unremarkable adrenal glands. No urinary calculi or hydronephrosis. Unremarkable bladder. Stomach/Bowel: Distended loops of small bowel in the left hemiabdomen measuring up to 29 mm. There is tapering to the entry into the left ventral abdominal wall hernia. Normal caliber colon. No bowel wall thickening. Colonic diverticulosis without diverticulitis. Normal appendix. Stomach is within normal limits. Vascular/Lymphatic: Normal caliber aorta. Indeterminate prominent  retroperitoneal and retrocrural lymph nodes measuring up to 9 mm on series 3/image 9 are favored reactive. Reproductive: Hysterectomy.  No adnexal mass. Other: Mesenteric edema. No free intraperitoneal air. Small bowel containing left ventral abdominal wall hernia. Musculoskeletal: No acute fracture. IMPRESSION: Findings suspicious for partial or early small-bowel obstruction with transition point at the entry into the left ventral abdominal wall hernia. Electronically Signed   By: Minerva Fester M.D.   On: 02/17/2023 23:50   CT HEAD WO CONTRAST ( )  Result Date: 02/17/2023 CLINICAL DATA:  Neuro deficit, acute, stroke suspected EXAM: CT HEAD WITHOUT CONTRAST TECHNIQUE: Contiguous axial images were obtained from the base of the skull through the vertex without intravenous contrast. RADIATION DOSE REDUCTION: This exam was performed according to the departmental dose-optimization program which includes automated exposure control, adjustment of the mA and/or kV according to patient size and/or use of iterative reconstruction technique. COMPARISON:  MRI head 10/25/2022 FINDINGS: Brain: Patchy and confluent areas of decreased attenuation are noted throughout the deep and periventricular white matter of the cerebral hemispheres bilaterally, compatible with chronic microvascular ischemic disease. 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 No evidence of large-territorial acute infarction. No parenchymal hemorrhage. No mass lesion. No extra-axial collection. No mass effect or midline shift. No hydrocephalus. Basilar cisterns are patent. Vascular: No hyperdense vessel. Skull: No acute fracture or focal lesion. Sinuses/Orbits: Paranasal sinuses and mastoid air cells are clear. The orbits are unremarkable. Other: None. IMPRESSION: No acute intracranial abnormality. Electronically Signed   By: Tish Frederickson M.D.   On: 02/17/2023 23:41   DG Chest Port 1 View  Result Date: 02/17/2023 CLINICAL DATA:   Altered mental status, nausea, and abdominal pain. Suspected bilateral foreleg cellulitis. [id number]. EXAM: PORTABLE CHEST 1 VIEW PORTABLE RIGHT TIBIA AND FIBULA - 2 VIEW PORTABLE LEFT TIBIA AND FIBULA - 2 VIEW COMPARISON:  PA and lateral chest 07/16/2022, left tibia fibula series 07/20/2019, right tibia fibula series 03/16/2017 FINDINGS: CHEST AP PORTABLE 9:10 P.M.: Loop recorder device left chest wall and multiple overlying monitor wires. The heart is enlarged but unchanged. No vascular congestion is seen. No substantial pleural effusion. The lungs are clear. Degenerative change and slight dextroscoliosis thoracic spine with no new osseous findings. AP LAT RIGHT TIBIA FIBULA: There is mild generalized edema. The edema was greater in 2018 than now. Interval increased multifocal soft tissue calcifications are noted most likely related to venous stasis. There is mild osteopenia with no evidence of fractures or acute osteomyelitis. There is apparent femorotibial degenerative arthrosis. Mild arthrosis of the ankle. AP LATERAL LEFT TIBIA FIBULA: Mild-to-moderate generalized edema. There are increased multifocal soft tissue calcifications most likely due to venous stasis. Irregularity along  the mid pretibial skin surface could indicate areas of ulceration. This is also seen posteriorly in the mid foreleg. No foreign body or soft tissue gas are identified. Underlying bones are mildly osteopenic but intact. There is advanced femorotibial degenerative arthrosis with mild arthrosis of the ankle. Small plantar and dorsal calcaneal spurs. IMPRESSION: 1. No evidence of acute chest disease. 2. Cardiomegaly. 3. Interval increased multifocal soft tissue calcifications both forelegs most likely due to venous stasis. 4. Left-greater-than-right soft tissue edema in the forelegs. 5. Irregularity along the left mid pretibial and posterior skin surface which could indicate areas of ulceration. 6. No evidence of fractures or acute  osteomyelitis in either foreleg. 7. Advanced arthrosis of both knees. Electronically Signed   By: Almira Bar M.D.   On: 02/17/2023 22:37   DG Tibia/Fibula Left Port  Result Date: 02/17/2023 CLINICAL DATA:  Altered mental status, nausea, and abdominal pain. Suspected bilateral foreleg cellulitis. [id number]. EXAM: PORTABLE CHEST 1 VIEW PORTABLE RIGHT TIBIA AND FIBULA - 2 VIEW PORTABLE LEFT TIBIA AND FIBULA - 2 VIEW COMPARISON:  PA and lateral chest 07/16/2022, left tibia fibula series 07/20/2019, right tibia fibula series 03/16/2017 FINDINGS: CHEST AP PORTABLE 9:10 P.M.: Loop recorder device left chest wall and multiple overlying monitor wires. The heart is enlarged but unchanged. No vascular congestion is seen. No substantial pleural effusion. The lungs are clear. Degenerative change and slight dextroscoliosis thoracic spine with no new osseous findings. AP LAT RIGHT TIBIA FIBULA: There is mild generalized edema. The edema was greater in 2018 than now. Interval increased multifocal soft tissue calcifications are noted most likely related to venous stasis. There is mild osteopenia with no evidence of fractures or acute osteomyelitis. There is apparent femorotibial degenerative arthrosis. Mild arthrosis of the ankle. AP LATERAL LEFT TIBIA FIBULA: Mild-to-moderate generalized edema. There are increased multifocal soft tissue calcifications most likely due to venous stasis. Irregularity along the mid pretibial skin surface could indicate areas of ulceration. This is also seen posteriorly in the mid foreleg. No foreign body or soft tissue gas are identified. Underlying bones are mildly osteopenic but intact. There is advanced femorotibial degenerative arthrosis with mild arthrosis of the ankle. Small plantar and dorsal calcaneal spurs. IMPRESSION: 1. No evidence of acute chest disease. 2. Cardiomegaly. 3. Interval increased multifocal soft tissue calcifications both forelegs most likely due to venous stasis. 4.  Left-greater-than-right soft tissue edema in the forelegs. 5. Irregularity along the left mid pretibial and posterior skin surface which could indicate areas of ulceration. 6. No evidence of fractures or acute osteomyelitis in either foreleg. 7. Advanced arthrosis of both knees. Electronically Signed   By: Almira Bar M.D.   On: 02/17/2023 22:37   DG Tibia/Fibula Right Port  Result Date: 02/17/2023 CLINICAL DATA:  Altered mental status, nausea, and abdominal pain. Suspected bilateral foreleg cellulitis. [id number]. EXAM: PORTABLE CHEST 1 VIEW PORTABLE RIGHT TIBIA AND FIBULA - 2 VIEW PORTABLE LEFT TIBIA AND FIBULA - 2 VIEW COMPARISON:  PA and lateral chest 07/16/2022, left tibia fibula series 07/20/2019, right tibia fibula series 03/16/2017 FINDINGS: CHEST AP PORTABLE 9:10 P.M.: Loop recorder device left chest wall and multiple overlying monitor wires. The heart is enlarged but unchanged. No vascular congestion is seen. No substantial pleural effusion. The lungs are clear. Degenerative change and slight dextroscoliosis thoracic spine with no new osseous findings. AP LAT RIGHT TIBIA FIBULA: There is mild generalized edema. The edema was greater in 2018 than now. Interval increased multifocal soft tissue calcifications are noted most likely related to  venous stasis. There is mild osteopenia with no evidence of fractures or acute osteomyelitis. There is apparent femorotibial degenerative arthrosis. Mild arthrosis of the ankle. AP LATERAL LEFT TIBIA FIBULA: Mild-to-moderate generalized edema. There are increased multifocal soft tissue calcifications most likely due to venous stasis. Irregularity along the mid pretibial skin surface could indicate areas of ulceration. This is also seen posteriorly in the mid foreleg. No foreign body or soft tissue gas are identified. Underlying bones are mildly osteopenic but intact. There is advanced femorotibial degenerative arthrosis with mild arthrosis of the ankle. Small plantar and  dorsal calcaneal spurs. IMPRESSION: 1. No evidence of acute chest disease. 2. Cardiomegaly. 3. Interval increased multifocal soft tissue calcifications both forelegs most likely due to venous stasis. 4. Left-greater-than-right soft tissue edema in the forelegs. 5. Irregularity along the left mid pretibial and posterior skin surface which could indicate areas of ulceration. 6. No evidence of fractures or acute osteomyelitis in either foreleg. 7. Advanced arthrosis of both knees. Electronically Signed   By: Almira Bar M.D.   On: 02/17/2023 22:37    Microbiology: Results for orders placed or performed during the hospital encounter of 02/17/23  Blood culture (routine x 2)     Status: None (Preliminary result)   Collection Time: 02/17/23  9:18 PM   Specimen: BLOOD LEFT FOREARM  Result Value Ref Range Status   Specimen Description BLOOD LEFT FOREARM  Final   Special Requests   Final    BOTTLES DRAWN AEROBIC AND ANAEROBIC Blood Culture results may not be optimal due to an excessive volume of blood received in culture bottles   Culture   Final    NO GROWTH 2 DAYS Performed at [facility], [address] [place], [place] [address]    Report Status PENDING  Incomplete  Blood culture (routine x 2)     Status: None (Preliminary result)   Collection Time: 02/18/23 12:02 AM   Specimen: BLOOD RIGHT ARM  Result Value Ref Range Status   Specimen Description BLOOD RIGHT ARM  Final   Special Requests   Final    BOTTLES DRAWN AEROBIC AND ANAEROBIC Blood Culture adequate volume   Culture   Final    NO GROWTH 1 DAY Performed at [facility], [address], [place], [place] [address]    Report Status PENDING  Incomplete    Labs: CBC: Recent Labs  Lab 02/17/23 2118 02/19/23 0842  WBC 10.8* 5.5  NEUTROABS 9.6*  --   HGB 11.2* 13.0  HCT 35.9* 41.4  MCV 86.7 88.1  PLT 336 327   Basic Metabolic Panel: Recent Labs  Lab 02/17/23 2118 02/19/23 0842  NA 138 139  K 3.6 3.6  CL 104  107  CO2 19* 22  GLUCOSE 177* 126*  BUN 7* <5*  CREATININE 0.83 0.85  CALCIUM 9.6 9.5   Liver Function Tests: Recent Labs  Lab 02/17/23 2118 02/19/23 0842  AST 13* 14*  ALT 11 11  ALKPHOS 92 84  BILITOT 0.7 0.6  PROT 8.5* 8.7*  ALBUMIN 2.7* 2.7*   CBG: Recent Labs  Lab 02/18/23 2231 02/19/23 0651 02/19/23 0809 02/19/23 0849 02/19/23 1202  GLUCAP 153* 106* 129* 132* 132*    Discharge time spent: greater than 30 minutes.  Signed: Alba Cory, MD [facility] 02/19/2023

## 2023-02-19 NOTE — TOC Progression Note (Signed)
Transition of Care (TOC) - Progression Note   Spoke to patient and husband at bedside.   Received orders for [facility] for wound care. Explained HHRN will not be able to visit daily , so family will need to do wound care . Husband stated he is able and has been doing wound care. Nurse will provide wound care education prior to discharge.   Patient would like another agency then she had in past.   Kandee Keen with [facility] accepted referral.   Address [address] , [place], [place]  Patient Details  Name: [name] MRN: [id number] Date of Birth: [birth year]  Transition of Care [facility]) CM/SW Contact  Nadene Rubins, Adria Devon, RN Phone Number: 02/19/2023, 9:53 AM  Clinical Narrative:            Expected Discharge Plan and Services                                   HH Arranged:  (await recommendations)           Social Determinants of Health (SDOH) Interventions SDOH Screenings   Food Insecurity: No Food Insecurity (10/23/2022)  Housing: Low Risk  (10/23/2022)  Transportation Needs: No Transportation Needs (10/23/2022)  Utilities: Not At Risk (10/23/2022)  Tobacco Use: Low Risk  (02/18/2023)    Readmission Risk Interventions     No data to display

## 2023-02-19 NOTE — Plan of Care (Signed)
  Problem: Education: Goal: Knowledge of General Education information will improve Description: Including pain rating scale, medication(s)/side effects and non-pharmacologic comfort measures Outcome: Progressing   Problem: Clinical Measurements: Goal: Ability to maintain clinical measurements within normal limits will improve Outcome: Progressing Goal: Will remain free from infection Outcome: Progressing Goal: Respiratory complications will improve Outcome: Progressing   Problem: Nutrition: Goal: Adequate nutrition will be maintained Outcome: Progressing   Problem: Coping: Goal: Level of anxiety will decrease Outcome: Progressing   Problem: Pain Managment: Goal: General experience of comfort will improve Outcome: Progressing

## 2023-02-19 NOTE — Progress Notes (Signed)
Subjective/Chief Complaint: Feels great, having bowel function, tol reg diet   Objective: Vital signs in last 24 hours: Temp:  [97.6 F (36.4 C)-98.7 F (37.1 C)] 97.7 F (36.5 C) (09/05 0533) Pulse Rate:  [50-74] 50 (09/05 0533) Resp:  [16-18] 18 (09/05 0533) BP: (113-160)/(62-71) 113/62 (09/05 0533) SpO2:  [95 %-100 %] 100 % (09/05 0533) Last BM Date : 02/14/23  Intake/Output from previous day: 09/04 0701 - 09/05 0700 In: 2045.2 [P.O.:1320; I.V.:525.2; IV Piggyback:200] Out: -  Intake/Output this shift: No intake/output data recorded.  Ab soft nontender nondistended  Lab Results:  Recent Labs    02/17/23 2118  WBC 10.8*  HGB 11.2*  HCT 35.9*  PLT 336   BMET Recent Labs    02/17/23 2118  NA 138  K 3.6  CL 104  CO2 19*  GLUCOSE 177*  BUN 7*  CREATININE 0.83  CALCIUM 9.6   PT/INR No results for input(s): "LABPROT", "INR" in the last 72 hours. ABG No results for input(s): "PHART", "HCO3" in the last 72 hours.  Invalid input(s): "PCO2", "PO2"  Studies/Results: CT ABDOMEN PELVIS W CONTRAST  Result Date: 02/17/2023 CLINICAL DATA:  Nausea, dry heaving, and stomach pain starting today EXAM: CT ABDOMEN AND PELVIS WITH CONTRAST TECHNIQUE: Multidetector CT imaging of the abdomen and pelvis was performed using the standard protocol following bolus administration of intravenous contrast. RADIATION DOSE REDUCTION: This exam was performed according to the departmental dose-optimization program which includes automated exposure control, adjustment of the mA and/or kV according to patient size and/or use of iterative reconstruction technique. CONTRAST:  75mL OMNIPAQUE IOHEXOL 350 MG/ML SOLN COMPARISON:  None Available. FINDINGS: Lower chest: No acute abnormality. Hepatobiliary: Cholecystectomy. Unremarkable liver and biliary tree. Pancreas: Unremarkable. Spleen: Unremarkable. Adrenals/Urinary Tract: Unremarkable adrenal glands. No urinary calculi or hydronephrosis.  Unremarkable bladder. Stomach/Bowel: Distended loops of small bowel in the left hemiabdomen measuring up to 29 mm. There is tapering to the entry into the left ventral abdominal wall hernia. Normal caliber colon. No bowel wall thickening. Colonic diverticulosis without diverticulitis. Normal appendix. Stomach is within normal limits. Vascular/Lymphatic: Normal caliber aorta. Indeterminate prominent retroperitoneal and retrocrural lymph nodes measuring up to 9 mm on series 3/image 9 are favored reactive. Reproductive: Hysterectomy.  No adnexal mass. Other: Mesenteric edema. No free intraperitoneal air. Small bowel containing left ventral abdominal wall hernia. Musculoskeletal: No acute fracture. IMPRESSION: Findings suspicious for partial or early small-bowel obstruction with transition point at the entry into the left ventral abdominal wall hernia. Electronically Signed   By: Minerva Fester M.D.   On: 02/17/2023 23:50   CT HEAD WO CONTRAST ( )  Result Date: 02/17/2023 CLINICAL DATA:  Neuro deficit, acute, stroke suspected EXAM: CT HEAD WITHOUT CONTRAST TECHNIQUE: Contiguous axial images were obtained from the base of the skull through the vertex without intravenous contrast. RADIATION DOSE REDUCTION: This exam was performed according to the departmental dose-optimization program which includes automated exposure control, adjustment of the mA and/or kV according to patient size and/or use of iterative reconstruction technique. COMPARISON:  MRI head 10/25/2022 FINDINGS: Brain: Patchy and confluent areas of decreased attenuation are noted throughout the deep and periventricular white matter of the cerebral hemispheres bilaterally, compatible with chronic microvascular ischemic disease. 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 No evidence of large-territorial acute infarction. No parenchymal hemorrhage. No mass lesion. No extra-axial collection. No mass effect or midline shift. No hydrocephalus.  Basilar cisterns are patent. Vascular: No hyperdense vessel. Skull: No acute fracture or focal lesion. Sinuses/Orbits: Paranasal  sinuses and mastoid air cells are clear. The orbits are unremarkable. Other: None. IMPRESSION: No acute intracranial abnormality. Electronically Signed   By: Tish Frederickson M.D.   On: 02/17/2023 23:41   DG Chest Port 1 View  Result Date: 02/17/2023 CLINICAL DATA:  Altered mental status, nausea, and abdominal pain. Suspected bilateral foreleg cellulitis. [id number]. EXAM: PORTABLE CHEST 1 VIEW PORTABLE RIGHT TIBIA AND FIBULA - 2 VIEW PORTABLE LEFT TIBIA AND FIBULA - 2 VIEW COMPARISON:  PA and lateral chest 07/16/2022, left tibia fibula series 07/20/2019, right tibia fibula series 03/16/2017 FINDINGS: CHEST AP PORTABLE 9:10 P.M.: Loop recorder device left chest wall and multiple overlying monitor wires. The heart is enlarged but unchanged. No vascular congestion is seen. No substantial pleural effusion. The lungs are clear. Degenerative change and slight dextroscoliosis thoracic spine with no new osseous findings. AP LAT RIGHT TIBIA FIBULA: There is mild generalized edema. The edema was greater in 2018 than now. Interval increased multifocal soft tissue calcifications are noted most likely related to venous stasis. There is mild osteopenia with no evidence of fractures or acute osteomyelitis. There is apparent femorotibial degenerative arthrosis. Mild arthrosis of the ankle. AP LATERAL LEFT TIBIA FIBULA: Mild-to-moderate generalized edema. There are increased multifocal soft tissue calcifications most likely due to venous stasis. Irregularity along the mid pretibial skin surface could indicate areas of ulceration. This is also seen posteriorly in the mid foreleg. No foreign body or soft tissue gas are identified. Underlying bones are mildly osteopenic but intact. There is advanced femorotibial degenerative arthrosis with mild arthrosis of the ankle. Small plantar and dorsal calcaneal spurs.  IMPRESSION: 1. No evidence of acute chest disease. 2. Cardiomegaly. 3. Interval increased multifocal soft tissue calcifications both forelegs most likely due to venous stasis. 4. Left-greater-than-right soft tissue edema in the forelegs. 5. Irregularity along the left mid pretibial and posterior skin surface which could indicate areas of ulceration. 6. No evidence of fractures or acute osteomyelitis in either foreleg. 7. Advanced arthrosis of both knees. Electronically Signed   By: Almira Bar M.D.   On: 02/17/2023 22:37   DG Tibia/Fibula Left Port  Result Date: 02/17/2023 CLINICAL DATA:  Altered mental status, nausea, and abdominal pain. Suspected bilateral foreleg cellulitis. [id number]. EXAM: PORTABLE CHEST 1 VIEW PORTABLE RIGHT TIBIA AND FIBULA - 2 VIEW PORTABLE LEFT TIBIA AND FIBULA - 2 VIEW COMPARISON:  PA and lateral chest 07/16/2022, left tibia fibula series 07/20/2019, right tibia fibula series 03/16/2017 FINDINGS: CHEST AP PORTABLE 9:10 P.M.: Loop recorder device left chest wall and multiple overlying monitor wires. The heart is enlarged but unchanged. No vascular congestion is seen. No substantial pleural effusion. The lungs are clear. Degenerative change and slight dextroscoliosis thoracic spine with no new osseous findings. AP LAT RIGHT TIBIA FIBULA: There is mild generalized edema. The edema was greater in 2018 than now. Interval increased multifocal soft tissue calcifications are noted most likely related to venous stasis. There is mild osteopenia with no evidence of fractures or acute osteomyelitis. There is apparent femorotibial degenerative arthrosis. Mild arthrosis of the ankle. AP LATERAL LEFT TIBIA FIBULA: Mild-to-moderate generalized edema. There are increased multifocal soft tissue calcifications most likely due to venous stasis. Irregularity along the mid pretibial skin surface could indicate areas of ulceration. This is also seen posteriorly in the mid foreleg. No foreign body or soft  tissue gas are identified. Underlying bones are mildly osteopenic but intact. There is advanced femorotibial degenerative arthrosis with mild arthrosis of the ankle. Small plantar and dorsal calcaneal spurs.  IMPRESSION: 1. No evidence of acute chest disease. 2. Cardiomegaly. 3. Interval increased multifocal soft tissue calcifications both forelegs most likely due to venous stasis. 4. Left-greater-than-right soft tissue edema in the forelegs. 5. Irregularity along the left mid pretibial and posterior skin surface which could indicate areas of ulceration. 6. No evidence of fractures or acute osteomyelitis in either foreleg. 7. Advanced arthrosis of both knees. Electronically Signed   By: Almira Bar M.D.   On: 02/17/2023 22:37   DG Tibia/Fibula Right Port  Result Date: 02/17/2023 CLINICAL DATA:  Altered mental status, nausea, and abdominal pain. Suspected bilateral foreleg cellulitis. [id number]. EXAM: PORTABLE CHEST 1 VIEW PORTABLE RIGHT TIBIA AND FIBULA - 2 VIEW PORTABLE LEFT TIBIA AND FIBULA - 2 VIEW COMPARISON:  PA and lateral chest 07/16/2022, left tibia fibula series 07/20/2019, right tibia fibula series 03/16/2017 FINDINGS: CHEST AP PORTABLE 9:10 P.M.: Loop recorder device left chest wall and multiple overlying monitor wires. The heart is enlarged but unchanged. No vascular congestion is seen. No substantial pleural effusion. The lungs are clear. Degenerative change and slight dextroscoliosis thoracic spine with no new osseous findings. AP LAT RIGHT TIBIA FIBULA: There is mild generalized edema. The edema was greater in 2018 than now. Interval increased multifocal soft tissue calcifications are noted most likely related to venous stasis. There is mild osteopenia with no evidence of fractures or acute osteomyelitis. There is apparent femorotibial degenerative arthrosis. Mild arthrosis of the ankle. AP LATERAL LEFT TIBIA FIBULA: Mild-to-moderate generalized edema. There are increased multifocal soft tissue  calcifications most likely due to venous stasis. Irregularity along the mid pretibial skin surface could indicate areas of ulceration. This is also seen posteriorly in the mid foreleg. No foreign body or soft tissue gas are identified. Underlying bones are mildly osteopenic but intact. There is advanced femorotibial degenerative arthrosis with mild arthrosis of the ankle. Small plantar and dorsal calcaneal spurs. IMPRESSION: 1. No evidence of acute chest disease. 2. Cardiomegaly. 3. Interval increased multifocal soft tissue calcifications both forelegs most likely due to venous stasis. 4. Left-greater-than-right soft tissue edema in the forelegs. 5. Irregularity along the left mid pretibial and posterior skin surface which could indicate areas of ulceration. 6. No evidence of fractures or acute osteomyelitis in either foreleg. 7. Advanced arthrosis of both knees. Electronically Signed   By: Almira Bar M.D.   On: 02/17/2023 22:37    Anti-infectives: Anti-infectives (From admission, onward)    Start     Dose/Rate Route Frequency Ordered Stop   02/18/23 1200  vancomycin (VANCOREADY) IVPB 750 mg/150 mL        750 mg 150 mL/hr over 60 Minutes Intravenous Every 12 hours 02/18/23 1002     02/18/23 1100  piperacillin-tazobactam (ZOSYN) IVPB 3.375 g        3.375 g 12.5 mL/hr over 240 Minutes Intravenous Every 8 hours 02/18/23 1002     02/18/23 0045  vancomycin (VANCOREADY) IVPB 2000 mg/400 mL        2,000 mg 200 mL/hr over 120 Minutes Intravenous  Once 02/18/23 0042 02/18/23 0404   02/17/23 2145  piperacillin-tazobactam (ZOSYN) IVPB 3.375 g        3.375 g 100 mL/hr over 30 Minutes Intravenous  Once 02/17/23 2139 02/17/23 2224       Assessment/Plan: Resolved ab pain, n/v -not sure she ever had a sbo -is doing fine, can go home today from our standpoint Emelia Loron 02/19/2023

## 2023-02-19 NOTE — Progress Notes (Signed)
Pt husband Franchot Gallo demonstrated pt bilateral lower extremity wound care with ease. At this time there is no question. Pt is anxious about been discharge.

## 2023-02-19 NOTE — Consult Note (Signed)
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 HPI: [name] is a 72 y.o. female who presents with Patient is a 72 year old woman who is seen for evaluation for large venous and lymphatic insufficiency ulcers both lower extremities.  Patient has been seen by wound ostomy continence nursing and has been started on Vashe dressing changes.  Patient states that she did have home health nursing visiting her at home and she states that this abruptly stopped and she has not had any wound care at home.  Past Medical History:  Diagnosis Date   Anxiety    Arthritis    "back, arms, legs" (03/24/2016)   Asthma    Chronic lower back pain    Colonic polyp    last colonoscopy done in 2009 with normal results per medical record   Depressive disorder    Gastric polyp    Gout    has taken allopurinol 300mg  once daily in past   Headache    History of hiatal hernia    Hypertension    Migraine    "none in awhile; might have a couple/year" (03/24/2016)   Mixed hyperlipidemia    01/2016 Total chol 141, HDL 59, LDL 63, ration 1.1   Osteoarthritis    TIA (transient ischemic attack) 11/2014   Type II diabetes mellitus (HCC)    Past Surgical History:  Procedure Laterality Date   ARTERY BIOPSY Right 08/12/2016   Procedure: BIOPSY TEMPORAL ARTERY;  Surgeon: Sherren Kerns, MD;  Location: [facility] OR;  Service: Vascular;  Laterality: Right;  BIOPSY TEMPORAL ARTERY   BREAST BIOPSY Left ~ 2015   benign   CARPAL TUNNEL RELEASE Bilateral    DILATION AND CURETTAGE OF UTERUS     I & D EXTREMITY Bilateral 10/24/2022   Procedure: DEBRIDEMENT LEFT LEG AND RIGHT ANKLE;  Surgeon: Nadara Mustard, MD;  Location: [facility] OR;  Service: Orthopedics;  Laterality: Bilateral;   IR CT HEAD LTD  06/11/2022   IR PERCUTANEOUS ART THROMBECTOMY/INFUSION INTRACRANIAL INC DIAG ANGIO  06/11/2022   IR US GUIDE VASC ACCESS RIGHT  06/11/2022    KNEE ARTHROSCOPY Right 2003   in [facility]   LAPAROSCOPIC CHOLECYSTECTOMY     LOOP RECORDER INSERTION N/A 06/26/2022   Procedure: LOOP RECORDER INSERTION;  Surgeon: Regan Lemming, MD;  Location: [facility] INVASIVE CV LAB;  Service: Cardiovascular;  Laterality: N/A;   RADIOLOGY WITH ANESTHESIA N/A 06/11/2022   Procedure: IR WITH ANESTHESIA;  Surgeon: Radiologist, Medication, MD;  Location: [facility] OR;  Service: Radiology;  Laterality: N/A;   TUBAL LIGATION     VAGINAL HYSTERECTOMY  1982   Social History   Socioeconomic History   Marital status: Married    Spouse name: Not on file   Number of children: Not on file   Years of education: 12+   Highest education level: Not on file  Occupational History   Not on file  Tobacco Use   Smoking status: Never   Smokeless tobacco: Never  Vaping Use   Vaping status: Never Used  Substance and Sexual Activity   Alcohol use: No   Drug use: No   Sexual activity: Never  Other Topics Concern   Not on file  Social History Narrative   Right handed   Caffeine use: none   Lives with husband   Recently moved from [place]   Social Determinants of Health   Financial Resource Strain: Not on file  Food Insecurity: No Food Insecurity (10/23/2022)   Hunger Vital Sign    Worried About Running Out of Food in the Last Year: Never true    Ran Out of Food in the Last Year: Never true  Transportation Needs: No Transportation Needs (10/23/2022)   PRAPARE - Administrator, Civil Service (Medical): No    Lack of Transportation (Non-Medical): No  Physical Activity: Not on file  Stress: Not on file  Social Connections: Not on file   Family History  Problem Relation Age of Onset   Stroke Father    Stroke Brother    Cancer Other    Gout Mother    Hypertension Mother    Arthritis Mother    Asthma Mother    Stroke Brother    - negative except otherwise stated in the family history section Allergies  Allergen Reactions   Ace Inhibitors Swelling    Latex Itching and Swelling   Tizanidine Other (See Comments)    Tremors    Ultram [Tramadol] Nausea And Vomiting   Diprivan [Propofol] Itching   Metformin And Related Other (See Comments)    Tremors  Chills   Prior to Admission medications   Medication Sig Start Date End Date Taking? Authorizing Provider  acetaminophen (TYLENOL) 325 MG tablet Take 2 tablets (650 mg total) by mouth every 6 (six) hours as needed for mild pain (or Fever >/= 101). 06/28/22  Yes Elgergawy, Leana Roe, MD  allopurinol (ZYLOPRIM) 100 MG tablet Take 100 mg by mouth daily. 01/29/23  Yes [provider]  aspirin EC 81 MG tablet Take 81 mg by mouth daily. Swallow whole.   Yes [provider]  atenolol (TENORMIN) 25 MG tablet Take 25-50 mg by mouth See admin instructions. 50 mg every morning, 25 mg at bedtime   Yes [provider]  atorvastatin (LIPITOR) 20 MG tablet Take 20 mg by mouth daily. 01/29/23  Yes [provider]  cyclobenzaprine (FLEXERIL) 5 MG tablet Take 5 mg by mouth 3 (three) times daily as needed for muscle spasms. 06/03/22  Yes [provider]  donepezil (ARICEPT) 10 MG tablet Take 10 mg by mouth in the morning.   Yes [provider]  gabapentin (NEURONTIN) 300 MG capsule Take 1 capsule (300 mg total) by mouth 2 (two) times daily. 10/29/22  Yes Sheikh, Omair [name], DO  HYDROcodone-acetaminophen (NORCO) 10-325 MG tablet Take 1 tablet by mouth 2 (two) times daily as needed for severe pain. 02/05/23  Yes [provider]  insulin glargine (LANTUS) 100 UNIT/ML Solostar Pen Inject 30 Units into the skin every morning. And pen needles 1/day 10/29/22  Yes Sheikh, Omair [name], DO  leptospermum manuka honey (MEDIHONEY) PSTE paste Apply 1 Application topically daily. 10/30/22  Yes Sheikh, Omair [name], DO  lurasidone (LATUDA) 40 MG TABS tablet Take 1 tablet (40 mg total) by mouth daily with breakfast. 06/16/22  Yes Bobbye Morton, MD  mineral oil-hydrophilic  petrolatum (AQUAPHOR) ointment Apply topically daily. 10/30/22  Yes Sheikh, Omair [name], DO  mupirocin ointment (BACTROBAN) 2 % Place 1 Application into the nose 2 (two) times daily. 10/29/22  Yes Sheikh, Omair [name], DO  NIFEdipine (PROCARDIA XL/NIFEDICAL-XL) 90 MG 24 hr tablet Take 90 mg by mouth in the morning.   Yes [provider]  ondansetron (ZOFRAN) 4 MG tablet Take 1 tablet (4 mg total) by mouth every 6 (six) hours. 07/16/22  Yes Prosperi, Christian H, PA-C  ondansetron (ZOFRAN) 4 MG tablet Take 1 tablet (4  mg total) by mouth every 6 (six) hours as needed for nausea. 10/29/22  Yes Sheikh, Omair [name], DO  polyethylene glycol powder (GLYCOLAX/MIRALAX) 17 GM/SCOOP powder Take 17 g by mouth daily as needed for mild constipation. 10/29/22  Yes Marguerita Merles [place], DO   CT ABDOMEN PELVIS W CONTRAST  Result Date: 02/17/2023 CLINICAL DATA:  Nausea, dry heaving, and stomach pain starting today EXAM: CT ABDOMEN AND PELVIS WITH CONTRAST TECHNIQUE: Multidetector CT imaging of the abdomen and pelvis was performed using the standard protocol following bolus administration of intravenous contrast. RADIATION DOSE REDUCTION: This exam was performed according to the departmental dose-optimization program which includes automated exposure control, adjustment of the mA and/or kV according to patient size and/or use of iterative reconstruction technique. CONTRAST:  75mL OMNIPAQUE IOHEXOL 350 MG/ML SOLN COMPARISON:  None Available. FINDINGS: Lower chest: No acute abnormality. Hepatobiliary: Cholecystectomy. Unremarkable liver and biliary tree. Pancreas: Unremarkable. Spleen: Unremarkable. Adrenals/Urinary Tract: Unremarkable adrenal glands. No urinary calculi or hydronephrosis. Unremarkable bladder. Stomach/Bowel: Distended loops of small bowel in the left hemiabdomen measuring up to 29 mm. There is tapering to the entry into the left ventral abdominal wall hernia. Normal caliber colon. No bowel wall thickening.  Colonic diverticulosis without diverticulitis. Normal appendix. Stomach is within normal limits. Vascular/Lymphatic: Normal caliber aorta. Indeterminate prominent retroperitoneal and retrocrural lymph nodes measuring up to 9 mm on series 3/image 9 are favored reactive. Reproductive: Hysterectomy.  No adnexal mass. Other: Mesenteric edema. No free intraperitoneal air. Small bowel containing left ventral abdominal wall hernia. Musculoskeletal: No acute fracture. IMPRESSION: Findings suspicious for partial or early small-bowel obstruction with transition point at the entry into the left ventral abdominal wall hernia. Electronically Signed   By: Minerva Fester M.D.   On: 02/17/2023 23:50   CT HEAD WO CONTRAST ( )  Result Date: 02/17/2023 CLINICAL DATA:  Neuro deficit, acute, stroke suspected EXAM: CT HEAD WITHOUT CONTRAST TECHNIQUE: Contiguous axial images were obtained from the base of the skull through the vertex without intravenous contrast. RADIATION DOSE REDUCTION: This exam was performed according to the departmental dose-optimization program which includes automated exposure control, adjustment of the mA and/or kV according to patient size and/or use of iterative reconstruction technique. COMPARISON:  MRI head 10/25/2022 FINDINGS: Brain: Patchy and confluent areas of decreased attenuation are noted throughout the deep and periventricular white matter of the cerebral hemispheres bilaterally, compatible with chronic microvascular ischemic disease. 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 No evidence of large-territorial acute infarction. No parenchymal hemorrhage. No mass lesion. No extra-axial collection. No mass effect or midline shift. No hydrocephalus. Basilar cisterns are patent. Vascular: No hyperdense vessel. Skull: No acute fracture or focal lesion. Sinuses/Orbits: Paranasal sinuses and mastoid air cells are clear. The orbits are unremarkable. Other: None. IMPRESSION: No acute  intracranial abnormality. Electronically Signed   By: Tish Frederickson M.D.   On: 02/17/2023 23:41   DG Chest Port 1 View  Result Date: 02/17/2023 CLINICAL DATA:  Altered mental status, nausea, and abdominal pain. Suspected bilateral foreleg cellulitis. [id number]. EXAM: PORTABLE CHEST 1 VIEW PORTABLE RIGHT TIBIA AND FIBULA - 2 VIEW PORTABLE LEFT TIBIA AND FIBULA - 2 VIEW COMPARISON:  PA and lateral chest 07/16/2022, left tibia fibula series 07/20/2019, right tibia fibula series 03/16/2017 FINDINGS: CHEST AP PORTABLE 9:10 P.M.: Loop recorder device left chest wall and multiple overlying monitor wires. The heart is enlarged but unchanged. No vascular congestion is seen. No substantial pleural effusion. The lungs are clear. Degenerative change and slight dextroscoliosis thoracic spine with no  new osseous findings. AP LAT RIGHT TIBIA FIBULA: There is mild generalized edema. The edema was greater in 2018 than now. Interval increased multifocal soft tissue calcifications are noted most likely related to venous stasis. There is mild osteopenia with no evidence of fractures or acute osteomyelitis. There is apparent femorotibial degenerative arthrosis. Mild arthrosis of the ankle. AP LATERAL LEFT TIBIA FIBULA: Mild-to-moderate generalized edema. There are increased multifocal soft tissue calcifications most likely due to venous stasis. Irregularity along the mid pretibial skin surface could indicate areas of ulceration. This is also seen posteriorly in the mid foreleg. No foreign body or soft tissue gas are identified. Underlying bones are mildly osteopenic but intact. There is advanced femorotibial degenerative arthrosis with mild arthrosis of the ankle. Small plantar and dorsal calcaneal spurs. IMPRESSION: 1. No evidence of acute chest disease. 2. Cardiomegaly. 3. Interval increased multifocal soft tissue calcifications both forelegs most likely due to venous stasis. 4. Left-greater-than-right soft tissue edema in the  forelegs. 5. Irregularity along the left mid pretibial and posterior skin surface which could indicate areas of ulceration. 6. No evidence of fractures or acute osteomyelitis in either foreleg. 7. Advanced arthrosis of both knees. Electronically Signed   By: Almira Bar M.D.   On: 02/17/2023 22:37   DG Tibia/Fibula Left Port  Result Date: 02/17/2023 CLINICAL DATA:  Altered mental status, nausea, and abdominal pain. Suspected bilateral foreleg cellulitis. [id number]. EXAM: PORTABLE CHEST 1 VIEW PORTABLE RIGHT TIBIA AND FIBULA - 2 VIEW PORTABLE LEFT TIBIA AND FIBULA - 2 VIEW COMPARISON:  PA and lateral chest 07/16/2022, left tibia fibula series 07/20/2019, right tibia fibula series 03/16/2017 FINDINGS: CHEST AP PORTABLE 9:10 P.M.: Loop recorder device left chest wall and multiple overlying monitor wires. The heart is enlarged but unchanged. No vascular congestion is seen. No substantial pleural effusion. The lungs are clear. Degenerative change and slight dextroscoliosis thoracic spine with no new osseous findings. AP LAT RIGHT TIBIA FIBULA: There is mild generalized edema. The edema was greater in 2018 than now. Interval increased multifocal soft tissue calcifications are noted most likely related to venous stasis. There is mild osteopenia with no evidence of fractures or acute osteomyelitis. There is apparent femorotibial degenerative arthrosis. Mild arthrosis of the ankle. AP LATERAL LEFT TIBIA FIBULA: Mild-to-moderate generalized edema. There are increased multifocal soft tissue calcifications most likely due to venous stasis. Irregularity along the mid pretibial skin surface could indicate areas of ulceration. This is also seen posteriorly in the mid foreleg. No foreign body or soft tissue gas are identified. Underlying bones are mildly osteopenic but intact. There is advanced femorotibial degenerative arthrosis with mild arthrosis of the ankle. Small plantar and dorsal calcaneal spurs. IMPRESSION: 1. No  evidence of acute chest disease. 2. Cardiomegaly. 3. Interval increased multifocal soft tissue calcifications both forelegs most likely due to venous stasis. 4. Left-greater-than-right soft tissue edema in the forelegs. 5. Irregularity along the left mid pretibial and posterior skin surface which could indicate areas of ulceration. 6. No evidence of fractures or acute osteomyelitis in either foreleg. 7. Advanced arthrosis of both knees. Electronically Signed   By: Almira Bar M.D.   On: 02/17/2023 22:37   DG Tibia/Fibula Right Port  Result Date: 02/17/2023 CLINICAL DATA:  Altered mental status, nausea, and abdominal pain. Suspected bilateral foreleg cellulitis. [id number]. EXAM: PORTABLE CHEST 1 VIEW PORTABLE RIGHT TIBIA AND FIBULA - 2 VIEW PORTABLE LEFT TIBIA AND FIBULA - 2 VIEW COMPARISON:  PA and lateral chest 07/16/2022, left tibia fibula series 07/20/2019, right tibia  fibula series 03/16/2017 FINDINGS: CHEST AP PORTABLE 9:10 P.M.: Loop recorder device left chest wall and multiple overlying monitor wires. The heart is enlarged but unchanged. No vascular congestion is seen. No substantial pleural effusion. The lungs are clear. Degenerative change and slight dextroscoliosis thoracic spine with no new osseous findings. AP LAT RIGHT TIBIA FIBULA: There is mild generalized edema. The edema was greater in 2018 than now. Interval increased multifocal soft tissue calcifications are noted most likely related to venous stasis. There is mild osteopenia with no evidence of fractures or acute osteomyelitis. There is apparent femorotibial degenerative arthrosis. Mild arthrosis of the ankle. AP LATERAL LEFT TIBIA FIBULA: Mild-to-moderate generalized edema. There are increased multifocal soft tissue calcifications most likely due to venous stasis. Irregularity along the mid pretibial skin surface could indicate areas of ulceration. This is also seen posteriorly in the mid foreleg. No foreign body or soft tissue gas are  identified. Underlying bones are mildly osteopenic but intact. There is advanced femorotibial degenerative arthrosis with mild arthrosis of the ankle. Small plantar and dorsal calcaneal spurs. IMPRESSION: 1. No evidence of acute chest disease. 2. Cardiomegaly. 3. Interval increased multifocal soft tissue calcifications both forelegs most likely due to venous stasis. 4. Left-greater-than-right soft tissue edema in the forelegs. 5. Irregularity along the left mid pretibial and posterior skin surface which could indicate areas of ulceration. 6. No evidence of fractures or acute osteomyelitis in either foreleg. 7. Advanced arthrosis of both knees. Electronically Signed   By: Almira Bar M.D.   On: 02/17/2023 22:37   - pertinent xrays, CT, MRI studies were reviewed and independently interpreted  Positive ROS: All other systems have been reviewed and were otherwise negative with the exception of those mentioned in the HPI and as above.  Physical Exam: General: Alert, no acute distress Psychiatric: Patient is competent for consent with normal mood and affect Lymphatic: No axillary or cervical lymphadenopathy Cardiovascular: No pedal edema Respiratory: No cyanosis, no use of accessory musculature GI: No organomegaly, abdomen is soft and non-tender    Images:  @ENCIMAGES @  Labs:  Lab Results  Component Value Date   HGBA1C 8.1 (H) 10/23/2022   HGBA1C 9.5 (H) 06/11/2022   HGBA1C 11.5 (A) 05/21/2021   ESRSEDRATE 90 (H) 10/23/2022   ESRSEDRATE 117 (H) 03/06/2021   ESRSEDRATE 104 (H) 02/05/2021   CRP 3.8 (H) 10/23/2022   CRP 43.7 (H) 03/06/2021   CRP 0.8 06/29/2019   REPTSTATUS PENDING 02/17/2023   GRAMSTAIN  10/24/2022    ABUNDANT SQUAMOUS EPITHELIAL CELLS PRESENT FEW WBC PRESENT, PREDOMINANTLY PMN MODERATE GRAM POSITIVE RODS MODERATE GRAM POSITIVE COCCI FEW GRAM NEGATIVE RODS    CULT  02/17/2023    NO GROWTH < 12 HOURS Performed at [facility], [address],  [place], [place] [address]    LABORGA PROTEUS MIRABILIS 10/24/2022   LABORGA STAPHYLOCOCCUS AUREUS 10/24/2022   LABORGA PSEUDOMONAS AERUGINOSA 10/24/2022    Lab Results  Component Value Date   ALBUMIN 2.7 (L) 02/17/2023   ALBUMIN 2.8 (L) 10/22/2022   ALBUMIN 2.9 (L) 07/16/2022   PREALBUMIN 12.5 (L) 03/16/2017        Latest Ref Rng & Units 02/17/2023    9:18 PM 10/25/2022   10:58 AM 10/22/2022    5:54 PM  CBC EXTENDED  WBC 4.0 - 10.5 K/uL 10.8  9.9  9.2   RBC 3.87 - 5.11 MIL/uL 4.14  4.66  4.63   Hemoglobin 12.0 - 15.0 g/dL 09.8  11.9  14.7   HCT  36.0 - 46.0 % 35.9  39.8  39.2   Platelets 150 - 400 K/uL 336  413  406   NEUT# 1.7 - 7.7 K/uL 9.6   6.0   Lymph# 0.7 - 4.0 K/uL 0.7   2.1     Neurologic: Patient does not have protective sensation bilateral lower extremities.   MUSCULOSKELETAL:   Skin: Examination patient has fibrinous exudative tissue over the extensive venous and lymphatic ulcers both lower extremities.  Ankle-brachial indices in May shows noncompressible flow in the right with a normal great toe pressure and left ankle-brachial indices within normal range and a left toe pressure within normal range.  Patient has triphasic flow bilaterally.  White cell count 10.8 albumin 2.7.  Hemoglobin A1c 8.1.  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  Plan: Plan: I agree with continuing the Vashe dressing changes.  Patient will need home health nursing set up to continue dressing changes and I will follow-up in the office.  Once the fibrinous exudative tissue has been debrided will proceed with tissue grafting in the office.  Thank you for the consult and the opportunity to see Ms. Synetta Shadow, MD [facility] [phone] 8:23 AM

## 2023-02-22 LAB — CULTURE, BLOOD (ROUTINE X 2): Culture: NO GROWTH

## 2023-02-23 LAB — CULTURE, BLOOD (ROUTINE X 2)
Culture: NO GROWTH
Special Requests: ADEQUATE

## 2023-03-03 ENCOUNTER — Ambulatory Visit: Payer: Medicare PPO | Admitting: Family

## 2023-03-03 DIAGNOSIS — I87333 Chronic venous hypertension (idiopathic) with ulcer and inflammation of bilateral lower extremity: Secondary | ICD-10-CM

## 2023-03-03 DIAGNOSIS — L97921 Non-pressure chronic ulcer of unspecified part of left lower leg limited to breakdown of skin: Secondary | ICD-10-CM | POA: Diagnosis not present

## 2023-03-03 DIAGNOSIS — L97511 Non-pressure chronic ulcer of other part of right foot limited to breakdown of skin: Secondary | ICD-10-CM | POA: Diagnosis not present

## 2023-03-03 DIAGNOSIS — B353 Tinea pedis: Secondary | ICD-10-CM

## 2023-03-06 ENCOUNTER — Encounter: Payer: Self-pay | Admitting: Family

## 2023-03-06 NOTE — Progress Notes (Signed)
Office Visit Note   Patient: [name]           Date of Birth: [birth year]           MRN: [id number] Visit Date: 03/03/2023              Requested by: Salvatore Decent, PA-C [id number] [place],  [place] [address] PCP: Hillery Aldo, NP  Chief Complaint  Patient presents with   Left Leg - Follow-up    10/24/2022 DEB LLE   Right Ankle - Follow-up    10/24/2022 debridement right ankle       HPI: The patient is a 72 year old woman who presents in follow-up for management of chronic bilateral lower extremity wounds.  Her last visit with [place] was in July she has recently had a hospitalization for abdominal pain  Unfortunately due to her dementia it was difficult to obtain a history she does not recall the debridement surgeries that she has had with Dr. Lajoyce Corners.  It is unclear whether she is having home health assistance or whether she and her husband are intermittently doing her dressing changes.  She has previously had issues with maggots in her ulcers.  Today denies any concerns with her ulcers no fever no chills  Assessment & Plan: Visit Diagnoses: No diagnosis found.  Plan: Recommended daily Dial soap cleansing.  Recommend compression elevation for swelling May use a thin layer of Silvadene or dry dressings.  Patient cannot tolerate compression wrapping will defer today.  We will reach out to [facility] in hopes that they will assist with her wound care  Follow-Up Instructions: No follow-ups on file.   Ortho Exam  Patient is alert, oriented, no adenopathy, well-dressed, normal affect, normal respiratory effort. On examination bilateral lower extremity ulcers are essentially unchanged over the dorsum of her right foot there is a 3 and half by 5-1/2 cm ulceration with 1 mm of depth filled in with granulation tissue there is no drainage or surrounding erythema warmth no sign of infection.  On examination of left foot the medial ankle and calf ulcers are healed over the dorsum of  her foot she has maceration to distal ulcers which are 1 and half by 1 and half centimeter in diameter each these have 1 mm of depth she has chronic venous stasis changes of bilateral lower extremities  Imaging: No results found. No images are attached to the encounter.  Labs: Lab Results  Component Value Date   HGBA1C 8.1 (H) 10/23/2022   HGBA1C 9.5 (H) 06/11/2022   HGBA1C 11.5 (A) 05/21/2021   ESRSEDRATE 90 (H) 10/23/2022   ESRSEDRATE 117 (H) 03/06/2021   ESRSEDRATE 104 (H) 02/05/2021   CRP 3.8 (H) 10/23/2022   CRP 43.7 (H) 03/06/2021   CRP 0.8 06/29/2019   REPTSTATUS 02/23/2023 FINAL 02/18/2023   GRAMSTAIN  10/24/2022    ABUNDANT SQUAMOUS EPITHELIAL CELLS PRESENT FEW WBC PRESENT, PREDOMINANTLY PMN MODERATE GRAM POSITIVE RODS MODERATE GRAM POSITIVE COCCI FEW GRAM NEGATIVE RODS    CULT  02/18/2023    NO GROWTH 5 DAYS Performed at [facility], [address] [place], [place] [address]    LABORGA PROTEUS MIRABILIS 10/24/2022   LABORGA STAPHYLOCOCCUS AUREUS 10/24/2022   LABORGA PSEUDOMONAS AERUGINOSA 10/24/2022     Lab Results  Component Value Date   ALBUMIN 2.7 (L) 02/19/2023   ALBUMIN 2.7 (L) 02/17/2023   ALBUMIN 2.8 (L) 10/22/2022   PREALBUMIN 12.5 (L) 03/16/2017    Lab Results  Component Value  Date   MG 2.1 06/27/2022   MG 1.9 06/25/2022   MG 1.6 (L) 06/13/2022   No results found for: "VD25OH"  Lab Results  Component Value Date   PREALBUMIN 12.5 (L) 03/16/2017      Latest Ref Rng & Units 02/19/2023    8:42 AM 02/17/2023    9:18 PM 10/25/2022   10:58 AM  CBC EXTENDED  WBC 4.0 - 10.5 K/uL 5.5  10.8  9.9   RBC 3.87 - 5.11 MIL/uL 4.70  4.14  4.66   Hemoglobin 12.0 - 15.0 g/dL 78.2  95.6  21.3   HCT 36.0 - 46.0 % 41.4  35.9  39.8   Platelets 150 - 400 K/uL 327  336  413   NEUT# 1.7 - 7.7 K/uL  9.6    Lymph# 0.7 - 4.0 K/uL  0.7       There is no height or weight on file to calculate BMI.  Orders:  No orders of the defined types were placed in  this encounter.  No orders of the defined types were placed in this encounter.    Procedures: No procedures performed  Clinical Data: No additional findings.  ROS:  All other systems negative, except as noted in the HPI. Review of Systems  Objective: Vital Signs: There were no vitals taken for this visit.  Specialty Comments:  No specialty comments available.  PMFS History: Patient Active Problem List   Diagnosis Date Noted   Ulcer of extremity due to chronic venous insufficiency (HCC) 02/19/2023   Lymphedema 02/19/2023   SBO (small bowel obstruction) (HCC) 02/18/2023   GAD (generalized anxiety disorder) 02/18/2023   Asthma, chronic 02/18/2023   Diabetic ulcer of left lower leg (HCC) 10/24/2022   Chronic venous hypertension (idiopathic) with ulcer and inflammation of bilateral lower extremity (HCC) 10/24/2022   Non-healing wound of lower extremity 10/23/2022   Bacteria in urine 06/26/2022   Stasis ulcer of lower extremity (HCC) 06/26/2022   Elevated troponin 06/25/2022   Abnormal urinalysis 06/25/2022   History of CVA (cerebrovascular accident) 06/25/2022   Memory loss 06/25/2022   Cryptogenic stroke (HCC) 06/11/2022   Middle cerebral artery embolism, 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   Wound infection 02/05/2021   Venous stasis dermatitis of both lower extremities    Pneumonia due to COVID-19 virus 06/29/2019   Hypophosphatemia 06/24/2019   Obesity 06/23/2019   Insulin dependent type 2 diabetes mellitus (HCC) 06/22/2019   Anxiety 06/22/2019   Essential hypertension 06/22/2019   Hyperlipidemia 06/22/2019   COVID-19 06/21/2019   Severe protein-calorie malnutrition (HCC) 06/21/2019   Acute respiratory failure with hypoxia (HCC) 06/21/2019   Diabetic foot ulcer associated with type 2 diabetes mellitus (HCC)    Cellulitis 07/05/2016   Depression 05/30/2016   Lower extremity ulceration (HCC) 05/30/2016   Generalized weakness  05/30/2016   Left arm weakness 05/30/2016   Left Lower extremity pain  04/02/2016   Lower extremity pain, inferior, left 04/02/2016   Left leg pain    Personal history of noncompliance with medical treatment, presenting hazards to health 03/31/2016   Jaw pain 03/27/2016   Left leg cellulitis 03/24/2016   Cellulitis of left lower extremity    Primary insomnia    Adjustment disorder    Morbid obesity (HCC) 01/27/2016   Wheelchair dependent 01/27/2016   Gout 01/27/2016   Arthritis 01/27/2016   Uncontrolled type 2 diabetes mellitus with hypoglycemia, with long-term current use of insulin (HCC) 01/25/2016   Cellulitis of left  leg 01/11/2016   Insulin-requiring or dependent type II diabetes mellitus (HCC) 01/11/2016   Past Medical History:  Diagnosis Date   Anxiety    Arthritis    "back, arms, legs" (03/24/2016)   Asthma    Chronic lower back pain    Colonic polyp    last colonoscopy done in 2009 with normal results per medical record   Depressive disorder    Gastric polyp    Gout    has taken allopurinol 300mg  once daily in past   Headache    History of hiatal hernia    Hypertension    Migraine    "none in awhile; might have a couple/year" (03/24/2016)   Mixed hyperlipidemia    01/2016 Total chol 141, HDL 59, LDL 63, ration 1.1   Osteoarthritis    TIA (transient ischemic attack) 11/2014   Type II diabetes mellitus (HCC)     Family History  Problem Relation Age of Onset   Stroke Father    Stroke Brother    Cancer Other    Gout Mother    Hypertension Mother    Arthritis Mother    Asthma Mother    Stroke Brother     Past Surgical History:  Procedure Laterality Date   ARTERY BIOPSY Right 08/12/2016   Procedure: BIOPSY TEMPORAL ARTERY;  Surgeon: Sherren Kerns, MD;  Location: [facility] OR;  Service: Vascular;  Laterality: Right;  BIOPSY TEMPORAL ARTERY   BREAST BIOPSY Left ~ 2015   benign   CARPAL TUNNEL RELEASE Bilateral    DILATION AND CURETTAGE OF UTERUS     I & D  EXTREMITY Bilateral 10/24/2022   Procedure: DEBRIDEMENT LEFT LEG AND RIGHT ANKLE;  Surgeon: Nadara Mustard, MD;  Location: [facility] OR;  Service: Orthopedics;  Laterality: Bilateral;   IR CT HEAD LTD  06/11/2022   IR PERCUTANEOUS ART THROMBECTOMY/INFUSION INTRACRANIAL INC DIAG ANGIO  06/11/2022   IR US GUIDE VASC ACCESS RIGHT  06/11/2022   KNEE ARTHROSCOPY Right 2003   in [facility]   LAPAROSCOPIC CHOLECYSTECTOMY     LOOP RECORDER INSERTION N/A 06/26/2022   Procedure: LOOP RECORDER INSERTION;  Surgeon: Regan Lemming, MD;  Location: [facility] INVASIVE CV LAB;  Service: Cardiovascular;  Laterality: N/A;   RADIOLOGY WITH ANESTHESIA N/A 06/11/2022   Procedure: IR WITH ANESTHESIA;  Surgeon: Radiologist, Medication, MD;  Location: [facility] OR;  Service: Radiology;  Laterality: N/A;   TUBAL LIGATION     VAGINAL HYSTERECTOMY  1982   Social History   Occupational History   Not on file  Tobacco Use   Smoking status: Never   Smokeless tobacco: Never  Vaping Use   Vaping status: Never Used  Substance and Sexual Activity   Alcohol use: No   Drug use: No   Sexual activity: Never

## 2023-04-01 ENCOUNTER — Ambulatory Visit: Payer: Medicare PPO | Admitting: Family

## 2023-04-09 ENCOUNTER — Telehealth: Payer: Self-pay | Admitting: Family

## 2023-04-09 NOTE — Telephone Encounter (Signed)
Faxed orders per pt's last OV note with Erin.

## 2023-04-09 NOTE — Telephone Encounter (Signed)
April from [employer] called wanted to know if you could fax orders please. Fax#[phone] CB#[phone]

## 2023-04-15 ENCOUNTER — Ambulatory Visit: Payer: Medicare PPO | Admitting: Family

## 2023-05-05 ENCOUNTER — Ambulatory Visit: Payer: Medicare PPO | Admitting: Family

## 2023-05-05 DIAGNOSIS — I890 Lymphedema, not elsewhere classified: Secondary | ICD-10-CM

## 2023-05-05 DIAGNOSIS — S81802D Unspecified open wound, left lower leg, subsequent encounter: Secondary | ICD-10-CM | POA: Diagnosis not present

## 2023-05-05 DIAGNOSIS — I87333 Chronic venous hypertension (idiopathic) with ulcer and inflammation of bilateral lower extremity: Secondary | ICD-10-CM

## 2023-05-05 DIAGNOSIS — L97511 Non-pressure chronic ulcer of other part of right foot limited to breakdown of skin: Secondary | ICD-10-CM

## 2023-05-06 NOTE — Progress Notes (Unsigned)
Office Visit Note   Patient: [name]           Date of Birth: August 31, 1950           MRN: [id number] Visit Date: 05/05/2023              Requested by: Hillery Aldo, NP [address] [place],  [place] [address] PCP: Hillery Aldo, NP  Chief Complaint  Patient presents with  . Right Leg - Wound Check  . Left Leg - Wound Check      HPI: ***  Assessment & Plan: Visit Diagnoses: No diagnosis found.  Plan: ***  Follow-Up Instructions: No follow-ups on file.   Ortho Exam  Patient is alert, oriented, no adenopathy, well-dressed, normal affect, normal respiratory effort. ***  Imaging: No results found.     Labs: Lab Results  Component Value Date   HGBA1C 8.1 (H) 10/23/2022   HGBA1C 9.5 (H) 06/11/2022   HGBA1C 11.5 (A) 05/21/2021   ESRSEDRATE 90 (H) 10/23/2022   ESRSEDRATE 117 (H) 03/06/2021   ESRSEDRATE 104 (H) 02/05/2021   CRP 3.8 (H) 10/23/2022   CRP 43.7 (H) 03/06/2021   CRP 0.8 06/29/2019   REPTSTATUS 02/23/2023 FINAL 02/18/2023   GRAMSTAIN  10/24/2022    ABUNDANT SQUAMOUS EPITHELIAL CELLS PRESENT FEW WBC PRESENT, PREDOMINANTLY PMN MODERATE GRAM POSITIVE RODS MODERATE GRAM POSITIVE COCCI FEW GRAM NEGATIVE RODS    CULT  02/18/2023    NO GROWTH 5 DAYS Performed at [facility], [address], [place], [place] [address]    LABORGA PROTEUS MIRABILIS 10/24/2022   LABORGA STAPHYLOCOCCUS AUREUS 10/24/2022   LABORGA PSEUDOMONAS AERUGINOSA 10/24/2022     Lab Results  Component Value Date   ALBUMIN 2.7 (L) 02/19/2023   ALBUMIN 2.7 (L) 02/17/2023   ALBUMIN 2.8 (L) 10/22/2022   PREALBUMIN 12.5 (L) 03/16/2017    Lab Results  Component Value Date   MG 2.1 06/27/2022   MG 1.9 06/25/2022   MG 1.6 (L) 06/13/2022   No results found for: "VD25OH"  Lab Results  Component Value Date   PREALBUMIN 12.5 (L) 03/16/2017      Latest Ref Rng & Units 02/19/2023    8:42 AM 02/17/2023    9:18 PM 10/25/2022   10:58 AM  CBC EXTENDED  WBC 4.0 -  10.5 K/uL 5.5  10.8  9.9   RBC 3.87 - 5.11 MIL/uL 4.70  4.14  4.66   Hemoglobin 12.0 - 15.0 g/dL 37.6  28.3  15.1   HCT 36.0 - 46.0 % 41.4  35.9  39.8   Platelets 150 - 400 K/uL 327  336  413   NEUT# 1.7 - 7.7 K/uL  9.6    Lymph# 0.7 - 4.0 K/uL  0.7       There is no height or weight on file to calculate BMI.  Orders:  No orders of the defined types were placed in this encounter.  No orders of the defined types were placed in this encounter.    Procedures: No procedures performed  Clinical Data: No additional findings.  ROS:  All other systems negative, except as noted in the HPI. Review of Systems  Objective: Vital Signs: There were no vitals taken for this visit.  Specialty Comments:  No specialty comments available.  PMFS History: Patient Active Problem List   Diagnosis Date Noted  . Ulcer of extremity due to chronic venous insufficiency (HCC) 02/19/2023  . Lymphedema 02/19/2023  . SBO (small bowel obstruction) (HCC) 02/18/2023  .  GAD (generalized anxiety disorder) 02/18/2023  . Asthma, chronic 02/18/2023  . Diabetic ulcer of left lower leg (HCC) 10/24/2022  . Chronic venous hypertension (idiopathic) with ulcer and inflammation of bilateral lower extremity (HCC) 10/24/2022  . Non-healing wound of lower extremity 10/23/2022  . Bacteria in urine 06/26/2022  . Stasis ulcer of lower extremity (HCC) 06/26/2022  . Elevated troponin 06/25/2022  . Abnormal urinalysis 06/25/2022  . History of CVA (cerebrovascular accident) 06/25/2022  . Memory loss 06/25/2022  . Cryptogenic stroke (HCC) 06/11/2022  . 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  . Wound infection 02/05/2021  . Venous stasis dermatitis of both lower extremities   . Pneumonia due to COVID-19 virus 06/29/2019  . Hypophosphatemia 06/24/2019  . Obesity 06/23/2019  . Insulin dependent type 2 diabetes mellitus (HCC) 06/22/2019  . Anxiety  06/22/2019  . Essential hypertension 06/22/2019  . Hyperlipidemia 06/22/2019  . COVID-19 06/21/2019  . Severe protein-calorie malnutrition (HCC) 06/21/2019  . Acute respiratory failure with hypoxia (HCC) 06/21/2019  . Diabetic foot ulcer associated with type 2 diabetes mellitus (HCC)   . Cellulitis 07/05/2016  . Depression 05/30/2016  . Lower extremity ulceration (HCC) 05/30/2016  . Generalized weakness 05/30/2016  . Left arm weakness 05/30/2016  . Left Lower extremity pain  04/02/2016  . Lower extremity pain, inferior, left 04/02/2016  . Left leg pain   . Personal history of noncompliance with medical treatment, presenting hazards to health 03/31/2016  . Jaw pain 03/27/2016  . Left leg cellulitis 03/24/2016  . Cellulitis of left lower extremity   . Primary insomnia   . Adjustment disorder   . Morbid obesity (HCC) 01/27/2016  . Wheelchair dependent 01/27/2016  . Gout 01/27/2016  . Arthritis 01/27/2016  . Uncontrolled type 2 diabetes mellitus with hypoglycemia, with long-term current use of insulin (HCC) 01/25/2016  . Cellulitis of left leg 01/11/2016  . Insulin-requiring or dependent type II diabetes mellitus (HCC) 01/11/2016   Past Medical History:  Diagnosis Date  . Anxiety   . Arthritis    "back, arms, legs" (03/24/2016)  . Asthma   . Chronic lower back pain   . Colonic polyp    last colonoscopy done in 2009 with normal results per medical record  . Depressive disorder   . Gastric polyp   . Gout    has taken allopurinol 300mg  once daily in past  . Headache   . History of hiatal hernia   . Hypertension   . Migraine    "none in awhile; might have a couple/year" (03/24/2016)  . Mixed hyperlipidemia    01/2016 Total chol 141, HDL 59, LDL 63, ration 1.1  . Osteoarthritis   . TIA (transient ischemic attack) 11/2014  . Type II diabetes mellitus (HCC)     Family History  Problem Relation Age of Onset  . Stroke Father   . Stroke Brother   . Cancer Other   . Gout  Mother   . Hypertension Mother   . Arthritis Mother   . Asthma Mother   . Stroke Brother     Past Surgical History:  Procedure Laterality Date  . ARTERY BIOPSY Right 08/12/2016   Procedure: BIOPSY TEMPORAL ARTERY;  Surgeon: Sherren Kerns, MD;  Location[facility];  Service: Vascular;  Laterality: Right;  BIOPSY TEMPORAL ARTERY  . BREAST BIOPSY Left ~ 2015   benign  . CARPAL TUNNEL RELEASE Bilateral   . DILATION AND CURETTAGE OF UTERUS    . I &  D EXTREMITY Bilateral 10/24/2022   Procedure: DEBRIDEMENT LEFT LEG AND RIGHT ANKLE;  Surgeon: Nadara Mustard, MD;  Location: [facility] OR;  Service: Orthopedics;  Laterality: Bilateral;  . IR CT HEAD LTD  06/11/2022  . IR PERCUTANEOUS ART THROMBECTOMY/INFUSION INTRACRANIAL INC DIAG ANGIO  06/11/2022  . IR US GUIDE VASC ACCESS RIGHT  06/11/2022  . KNEE ARTHROSCOPY Right 2003   in [place]  . LAPAROSCOPIC CHOLECYSTECTOMY    . LOOP RECORDER INSERTION N/A 06/26/2022   Procedure: LOOP RECORDER INSERTION;  Surgeon: Regan Lemming, MD;  Location: [facility] INVASIVE CV LAB;  Service: Cardiovascular;  Laterality: N/A;  . RADIOLOGY WITH ANESTHESIA N/A 06/11/2022   Procedure: IR WITH ANESTHESIA;  Surgeon: Radiologist, Medication, MD;  Location: [facility] OR;  Service: Radiology;  Laterality: N/A;  . TUBAL LIGATION    . VAGINAL HYSTERECTOMY  1982   Social History   Occupational History  . Not on file  Tobacco Use  . Smoking status: Never  . Smokeless tobacco: Never  Vaping Use  . Vaping status: Never Used  Substance and Sexual Activity  . Alcohol use: No  . Drug use: No  . Sexual activity: Never

## 2023-05-07 ENCOUNTER — Encounter: Payer: Self-pay | Admitting: Family

## 2023-05-19 ENCOUNTER — Other Ambulatory Visit: Payer: Self-pay

## 2023-05-19 ENCOUNTER — Inpatient Hospital Stay (HOSPITAL_COMMUNITY)
Admission: EM | Admit: 2023-05-19 | Discharge: 2023-05-24 | DRG: 603 | Disposition: A | Discharge status: Home Health | Payer: Medicare PPO | Source: Ambulatory Visit | Attending: Family Medicine | Admitting: Family Medicine

## 2023-05-19 ENCOUNTER — Emergency Department (HOSPITAL_COMMUNITY): Payer: Medicare PPO

## 2023-05-19 DIAGNOSIS — Z860100 Personal history of colon polyps, unspecified: Secondary | ICD-10-CM

## 2023-05-19 DIAGNOSIS — Z7982 Long term (current) use of aspirin: Secondary | ICD-10-CM

## 2023-05-19 DIAGNOSIS — E782 Mixed hyperlipidemia: Secondary | ICD-10-CM | POA: Diagnosis present

## 2023-05-19 DIAGNOSIS — M1A0790 Idiopathic chronic gout, unspecified ankle and foot, without tophus (tophi): Secondary | ICD-10-CM | POA: Diagnosis not present

## 2023-05-19 DIAGNOSIS — R7881 Bacteremia: Secondary | ICD-10-CM | POA: Diagnosis present

## 2023-05-19 DIAGNOSIS — E1151 Type 2 diabetes mellitus with diabetic peripheral angiopathy without gangrene: Secondary | ICD-10-CM | POA: Diagnosis present

## 2023-05-19 DIAGNOSIS — E66812 Obesity, class 2: Secondary | ICD-10-CM | POA: Diagnosis present

## 2023-05-19 DIAGNOSIS — Z79899 Other long term (current) drug therapy: Secondary | ICD-10-CM

## 2023-05-19 DIAGNOSIS — Z8249 Family history of ischemic heart disease and other diseases of the circulatory system: Secondary | ICD-10-CM

## 2023-05-19 DIAGNOSIS — L03116 Cellulitis of left lower limb: Principal | ICD-10-CM | POA: Diagnosis present

## 2023-05-19 DIAGNOSIS — L03119 Cellulitis of unspecified part of limb: Secondary | ICD-10-CM | POA: Diagnosis not present

## 2023-05-19 DIAGNOSIS — I872 Venous insufficiency (chronic) (peripheral): Secondary | ICD-10-CM | POA: Diagnosis not present

## 2023-05-19 DIAGNOSIS — L0390 Cellulitis, unspecified: Secondary | ICD-10-CM | POA: Diagnosis present

## 2023-05-19 DIAGNOSIS — E876 Hypokalemia: Secondary | ICD-10-CM | POA: Diagnosis not present

## 2023-05-19 DIAGNOSIS — I10 Essential (primary) hypertension: Secondary | ICD-10-CM | POA: Diagnosis present

## 2023-05-19 DIAGNOSIS — Z91040 Latex allergy status: Secondary | ICD-10-CM | POA: Diagnosis not present

## 2023-05-19 DIAGNOSIS — Z8261 Family history of arthritis: Secondary | ICD-10-CM | POA: Diagnosis not present

## 2023-05-19 DIAGNOSIS — Z9049 Acquired absence of other specified parts of digestive tract: Secondary | ICD-10-CM

## 2023-05-19 DIAGNOSIS — M1A9XX0 Chronic gout, unspecified, without tophus (tophi): Secondary | ICD-10-CM | POA: Diagnosis present

## 2023-05-19 DIAGNOSIS — Z90710 Acquired absence of both cervix and uterus: Secondary | ICD-10-CM

## 2023-05-19 DIAGNOSIS — R531 Weakness: Secondary | ICD-10-CM | POA: Diagnosis present

## 2023-05-19 DIAGNOSIS — Z888 Allergy status to other drugs, medicaments and biological substances status: Secondary | ICD-10-CM | POA: Diagnosis not present

## 2023-05-19 DIAGNOSIS — Z823 Family history of stroke: Secondary | ICD-10-CM

## 2023-05-19 DIAGNOSIS — Z6837 Body mass index (BMI) 37.0-37.9, adult: Secondary | ICD-10-CM | POA: Diagnosis not present

## 2023-05-19 DIAGNOSIS — L03115 Cellulitis of right lower limb: Principal | ICD-10-CM | POA: Diagnosis present

## 2023-05-19 DIAGNOSIS — Z825 Family history of asthma and other chronic lower respiratory diseases: Secondary | ICD-10-CM | POA: Diagnosis not present

## 2023-05-19 DIAGNOSIS — Z8673 Personal history of transient ischemic attack (TIA), and cerebral infarction without residual deficits: Secondary | ICD-10-CM | POA: Diagnosis not present

## 2023-05-19 DIAGNOSIS — L98499 Non-pressure chronic ulcer of skin of other sites with unspecified severity: Secondary | ICD-10-CM | POA: Diagnosis not present

## 2023-05-19 LAB — CBC WITH DIFFERENTIAL/PLATELET
Abs Immature Granulocytes: 0.03 10*3/uL (ref 0.00–0.07)
Basophils Absolute: 0 10*3/uL (ref 0.0–0.1)
Basophils Relative: 1 %
Eosinophils Absolute: 0.2 10*3/uL (ref 0.0–0.5)
Eosinophils Relative: 3 %
HCT: 32 % — ABNORMAL LOW (ref 36.0–46.0)
Hemoglobin: 10 g/dL — ABNORMAL LOW (ref 12.0–15.0)
Immature Granulocytes: 0 %
Lymphocytes Relative: 29 %
Lymphs Abs: 2.1 10*3/uL (ref 0.7–4.0)
MCH: 28.6 pg (ref 26.0–34.0)
MCHC: 31.3 g/dL (ref 30.0–36.0)
MCV: 91.4 fL (ref 80.0–100.0)
Monocytes Absolute: 0.4 10*3/uL (ref 0.1–1.0)
Monocytes Relative: 6 %
Neutro Abs: 4.5 10*3/uL (ref 1.7–7.7)
Neutrophils Relative %: 61 %
Platelets: 467 10*3/uL — ABNORMAL HIGH (ref 150–400)
RBC: 3.5 MIL/uL — ABNORMAL LOW (ref 3.87–5.11)
RDW: 14.8 % (ref 11.5–15.5)
WBC: 7.3 10*3/uL (ref 4.0–10.5)
nRBC: 0 % (ref 0.0–0.2)

## 2023-05-19 LAB — CBC
HCT: 33.8 % — ABNORMAL LOW (ref 36.0–46.0)
Hemoglobin: 10.6 g/dL — ABNORMAL LOW (ref 12.0–15.0)
MCH: 27.9 pg (ref 26.0–34.0)
MCHC: 31.4 g/dL (ref 30.0–36.0)
MCV: 88.9 fL (ref 80.0–100.0)
Platelets: 396 10*3/uL (ref 150–400)
RBC: 3.8 MIL/uL — ABNORMAL LOW (ref 3.87–5.11)
RDW: 14.8 % (ref 11.5–15.5)
WBC: 7.2 10*3/uL (ref 4.0–10.5)
nRBC: 0 % (ref 0.0–0.2)

## 2023-05-19 LAB — CREATININE, SERUM
Creatinine, Ser: 0.78 mg/dL (ref 0.44–1.00)
GFR, Estimated: >60 mL/min (ref >60)

## 2023-05-19 LAB — I-STAT CG4 LACTIC ACID, ED
Lactic Acid, Venous: 1.2 mmol/L (ref 0.5–1.9)
Lactic Acid, Venous: 1 mmol/L (ref 0.5–1.9)

## 2023-05-19 LAB — COMPREHENSIVE METABOLIC PANEL
ALT: 8 U/L (ref 0–44)
AST: 15 U/L (ref 15–41)
Albumin: 2.7 g/dL — ABNORMAL LOW (ref 3.5–5.0)
Alkaline Phosphatase: 81 U/L (ref 38–126)
Anion gap: 11 (ref 5–15)
BUN: 5 mg/dL — ABNORMAL LOW (ref 8–23)
CO2: 22 mmol/L (ref 22–32)
Calcium: 9.3 mg/dL (ref 8.9–10.3)
Chloride: 105 mmol/L (ref 98–111)
Creatinine, Ser: 0.76 mg/dL (ref 0.44–1.00)
GFR, Estimated: >60 mL/min (ref >60)
Glucose, Bld: 102 mg/dL — ABNORMAL HIGH (ref 70–99)
Potassium: 4.2 mmol/L (ref 3.5–5.1)
Sodium: 138 mmol/L (ref 135–145)
Total Bilirubin: 0.7 mg/dL (ref <1.2)
Total Protein: 8.8 g/dL — ABNORMAL HIGH (ref 6.5–8.1)

## 2023-05-19 LAB — CBG MONITORING, ED: Glucose-Capillary: 97 mg/dL (ref 70–99)

## 2023-05-19 MED ORDER — SODIUM CHLORIDE 0.9 % IV SOLN
2.0000 g | Freq: Once | INTRAVENOUS | Status: AC
Start: 2023-05-19 — End: 2023-05-19
  Administered 2023-05-19: 2 g via INTRAVENOUS
  Filled 2023-05-19: qty 12.5

## 2023-05-19 MED ORDER — OXYCODONE HCL 5 MG PO TABS
5.0000 mg | ORAL_TABLET | Freq: Four times a day (QID) | ORAL | Status: DC | PRN
  Administered 2023-05-20 – 2023-05-22 (×7): 5 mg via ORAL
  Filled 2023-05-19 (×7): qty 1

## 2023-05-19 MED ORDER — VANCOMYCIN HCL 1.5 G IV SOLR
1500.0000 mg | Freq: Once | INTRAVENOUS | Status: AC
  Administered 2023-05-20: 1500 mg via INTRAVENOUS
  Filled 2023-05-19 (×2): qty 30

## 2023-05-19 MED ORDER — ACETAMINOPHEN 325 MG PO TABS
650.0000 mg | ORAL_TABLET | Freq: Four times a day (QID) | ORAL | Status: DC | PRN
  Administered 2023-05-20: 650 mg via ORAL
  Filled 2023-05-19: qty 2

## 2023-05-19 MED ORDER — MELATONIN 5 MG PO TABS: 5.0000 mg | ORAL_TABLET | Freq: Every evening | ORAL | Status: DC | PRN

## 2023-05-19 MED ORDER — VANCOMYCIN HCL IN DEXTROSE 1-5 GM/200ML-% IV SOLN
1000.0000 mg | Freq: Once | INTRAVENOUS | Status: AC
  Administered 2023-05-19: 1000 mg via INTRAVENOUS
  Filled 2023-05-19: qty 200

## 2023-05-19 MED ORDER — PROCHLORPERAZINE EDISYLATE 10 MG/2ML IJ SOLN: 5.0000 mg | Freq: Four times a day (QID) | INTRAMUSCULAR | Status: DC | PRN

## 2023-05-19 MED ORDER — POLYETHYLENE GLYCOL 3350 17 G PO PACK: 17.0000 g | PACK | Freq: Every day | ORAL | Status: DC | PRN

## 2023-05-19 MED ORDER — ENOXAPARIN SODIUM 60 MG/0.6ML IJ SOSY
60.0000 mg | PREFILLED_SYRINGE | INTRAMUSCULAR | Status: DC
  Administered 2023-05-19: 60 mg via SUBCUTANEOUS
  Filled 2023-05-19: qty 0.6

## 2023-05-19 MED ORDER — SODIUM CHLORIDE 0.9 % IV SOLN
2.0000 g | Freq: Three times a day (TID) | INTRAVENOUS | Status: DC
  Administered 2023-05-20 – 2023-05-21 (×4): 2 g via INTRAVENOUS
  Filled 2023-05-19 (×4): qty 12.5

## 2023-05-19 MED ORDER — VANCOMYCIN HCL IN DEXTROSE 1-5 GM/200ML-% IV SOLN
1000.0000 mg | Freq: Two times a day (BID) | INTRAVENOUS | Status: DC
  Administered 2023-05-20: 1000 mg via INTRAVENOUS
  Filled 2023-05-19: qty 200

## 2023-05-19 NOTE — H&P (Signed)
History and Physical  DALEY RIGGAN YQM:[id number] DOB: 05-Jan-1951 DOA: 05/19/2023  Referring physician: Ralph Leyden, PA-EDP  PCP: Hillery Aldo, NP  Outpatient Specialists: Orthopedic surgery. Patient coming from: Home.  Chief Complaint: Leg wounds.  HPI: [name] is a 72 y.o. female with medical history significant for chronic bilateral lower extremity wounds, type 2 diabetes, hyperlipidemia, hypertension, obesity, who presented to the ED, sent from her PCPs office due to worsening leg wounds..  Associated with subjective intermittent fevers.  In the ED, temperature is mildly elevated, increased drainage noted in left lower extremity.  The patient was started on IV vancomycin and cefepime in the ED.  Admitted by [facility], hospitalist service.  ED Course: Temperature 99.1.  BP 114/64, pulse 91, respiration rate 18, saturation 100% on room air.  Review of Systems: Review of systems as noted in the HPI. All other systems reviewed and are negative.   Past Medical History:  Diagnosis Date   Anxiety    Arthritis    "back, arms, legs" (03/24/2016)   Asthma    Chronic lower back pain    Colonic polyp    last colonoscopy done in 2009 with normal results per medical record   Depressive disorder    Gastric polyp    Gout    has taken allopurinol 300mg  once daily in past   Headache    History of hiatal hernia    Hypertension    Migraine    "none in awhile; might have a couple/year" (03/24/2016)   Mixed hyperlipidemia    01/2016 Total chol 141, HDL 59, LDL 63, ration 1.1   Osteoarthritis    TIA (transient ischemic attack) 11/2014   Type II diabetes mellitus (HCC)    Past Surgical History:  Procedure Laterality Date   ARTERY BIOPSY Right 08/12/2016   Procedure: BIOPSY TEMPORAL ARTERY;  Surgeon: Sherren Kerns, MD;  Location: [facility] OR;  Service: Vascular;  Laterality: Right;  BIOPSY TEMPORAL ARTERY   BREAST BIOPSY Left ~ 2015   benign   CARPAL TUNNEL RELEASE Bilateral     DILATION AND CURETTAGE OF UTERUS     I & D EXTREMITY Bilateral 10/24/2022   Procedure: DEBRIDEMENT LEFT LEG AND RIGHT ANKLE;  Surgeon: Nadara Mustard, MD;  Location: [facility] OR;  Service: Orthopedics;  Laterality: Bilateral;   IR CT HEAD LTD  06/11/2022   IR PERCUTANEOUS ART THROMBECTOMY/INFUSION INTRACRANIAL INC DIAG ANGIO  06/11/2022   IR US GUIDE VASC ACCESS RIGHT  06/11/2022   KNEE ARTHROSCOPY Right 2003   in [facility]   LAPAROSCOPIC CHOLECYSTECTOMY     LOOP RECORDER INSERTION N/A 06/26/2022   Procedure: LOOP RECORDER INSERTION;  Surgeon: Regan Lemming, MD;  Location: [facility] INVASIVE CV LAB;  Service: Cardiovascular;  Laterality: N/A;   RADIOLOGY WITH ANESTHESIA N/A 06/11/2022   Procedure: IR WITH ANESTHESIA;  Surgeon: Radiologist, Medication, MD;  Location: [facility] OR;  Service: Radiology;  Laterality: N/A;   TUBAL LIGATION     VAGINAL HYSTERECTOMY  1982    Social History:  reports that she has never smoked. She has never used smokeless tobacco. She reports that she does not drink alcohol and does not use drugs.   Allergies  Allergen Reactions   Ace Inhibitors Swelling   Latex Itching and Swelling   Tizanidine Other (See Comments)    Tremors    Ultram [Tramadol] Nausea And Vomiting   Diprivan [Propofol] Itching   Metformin And Related Other (See Comments)    Tremors  Chills  Family History  Problem Relation Age of Onset   Stroke Father    Stroke Brother    Cancer Other    Gout Mother    Hypertension Mother    Arthritis Mother    Asthma Mother    Stroke Brother       Prior to Admission medications   Medication Sig Start Date End Date Taking? Authorizing Provider  acetaminophen (TYLENOL) 325 MG tablet Take 2 tablets (650 mg total) by mouth every 6 (six) hours as needed for mild pain (or Fever >/= 101). 06/28/22   Elgergawy, Leana Roe, MD  allopurinol (ZYLOPRIM) 100 MG tablet Take 100 mg by mouth daily. 01/29/23   [provider]  aspirin EC 81 MG tablet Take 81 mg by  mouth daily. Swallow whole.    [provider]  atenolol (TENORMIN) 25 MG tablet Take 25-50 mg by mouth See admin instructions. 50 mg every morning, 25 mg at bedtime    [provider]  atorvastatin (LIPITOR) 20 MG tablet Take 20 mg by mouth daily. 01/29/23   [provider]  cyclobenzaprine (FLEXERIL) 5 MG tablet Take 5 mg by mouth 3 (three) times daily as needed for muscle spasms. 06/03/22   [provider]  donepezil (ARICEPT) 10 MG tablet Take 10 mg by mouth in the morning.    [provider]  gabapentin (NEURONTIN) 300 MG capsule Take 1 capsule (300 mg total) by mouth 2 (two) times daily. 10/29/22   Marguerita Merles [name], DO  HYDROcodone-acetaminophen (NORCO) 10-325 MG tablet Take 1 tablet by mouth 2 (two) times daily as needed for severe pain. 02/05/23   [provider]  lurasidone (LATUDA) 40 MG TABS tablet Take 1 tablet (40 mg total) by mouth daily with breakfast. 06/16/22   Bobbye Morton, MD  NIFEdipine (PROCARDIA XL/NIFEDICAL-XL) 90 MG 24 hr tablet Take 90 mg by mouth in the morning.    [provider]  ondansetron (ZOFRAN) 4 MG tablet Take 1 tablet (4 mg total) by mouth every 6 (six) hours. 07/16/22   Prosperi, Christian H, PA-C  polyethylene glycol powder (GLYCOLAX/MIRALAX) 17 GM/SCOOP powder Take 17 g by mouth daily as needed for mild constipation. 10/29/22   Sheikh, Omair [name], DO  TRESIBA FLEXTOUCH 200 UNIT/ML FlexTouch Pen Inject 30 Units into the skin in the morning and at bedtime. 02/26/23   [provider]    Physical Exam: BP 131/70 (BP Location: Right Arm)   Pulse 79   Temp (!) 97.4 F (36.3 C) (Oral)   Resp 16   Ht 5\' 4"  (1.626 m)   Wt 127.5 kg   SpO2 100%   BMI 48.25 kg/m   General: 72 y.o. year-old female well developed well nourished in no acute distress.  Alert and oriented x3. Cardiovascular: Regular rate and rhythm with no rubs or gallops.  No thyromegaly or JVD noted.  No lower extremity edema.  2/4 pulses in all 4 extremities. Respiratory: Clear to auscultation with no wheezes or rales. Good inspiratory effort. Abdomen: Soft nontender nondistended with normal bowel sounds x4 quadrants. Muskuloskeletal: No cyanosis, clubbing or edema noted bilaterally Neuro: CN II-XII intact, strength, sensation, reflexes Skin:  Psychiatry: Judgement and insight appear normal. Mood is appropriate for condition and setting          Labs on Admission:  Basic Metabolic Panel: Recent Labs  Lab 05/19/23 1228 05/19/23 2110  NA 138  --   K 4.2  --   CL 105  --  CO2 22  --   GLUCOSE 102*  --   BUN 5*  --   CREATININE 0.76 0.78  CALCIUM 9.3  --    Liver Function Tests: Recent Labs  Lab 05/19/23 1228  AST 15  ALT 8  ALKPHOS 81  BILITOT 0.7  PROT 8.8*  ALBUMIN 2.7*   No results for input(s): "LIPASE", "AMYLASE" in the last 168 hours. No results for input(s): "AMMONIA" in the last 168 hours. CBC: Recent Labs  Lab 05/19/23 1228 05/19/23 2110  WBC 7.3 7.2  NEUTROABS 4.5  --   HGB 10.0* 10.6*  HCT 32.0* 33.8*  MCV 91.4 88.9  PLT 467* 396   Cardiac Enzymes: No results for input(s): "CKTOTAL", "CKMB", "CKMBINDEX", "TROPONINI" in the last 168 hours.  BNP (last 3 results) No results for input(s): "BNP" in the last 8760 hours.  ProBNP (last 3 results) No results for input(s): "PROBNP" in the last 8760 hours.  CBG: Recent Labs  Lab 05/19/23 1231  GLUCAP 97    Radiological Exams on Admission: DG Tibia/Fibula Right  Result Date: 05/19/2023 CLINICAL DATA:  Right lower extremity pain. Concern for infected wounds. EXAM: RIGHT TIBIA AND FIBULA - 2 VIEW COMPARISON:  Right lower extremity radiograph dated 02/17/2023. FINDINGS: There is no acute fracture or dislocation. The bones are osteopenic. Severe arthritic changes of the knee with severe joint space narrowing and spurring. There is diffuse subcutaneous edema and scattered subcutaneous calcification, likely sequela of chronic  venous stasis. No radiopaque foreign object or soft tissue gas. IMPRESSION: 1. No acute fracture or dislocation. 2. Diffuse subcutaneous edema. Electronically Signed   By: Elgie Collard M.D.   On: 05/19/2023 19:55   DG Foot Complete Left  Result Date: 05/19/2023 CLINICAL DATA:  Infected leg wounds for 1 year. EXAM: LEFT FOOT - COMPLETE 3+ VIEW COMPARISON:  10/22/2022 FINDINGS: Degenerative changes in the interphalangeal joints, first metatarsal-phalangeal joint, and intertarsal joints. Focal erosion again demonstrated in the first metatarsal head possibly indicating degenerative cyst and unchanged. Developing lucencies at the fourth metatarsal head could indicate osteomyelitis in the appropriate setting. No acute fracture or dislocation. Diffuse soft tissue swelling. No radiopaque soft tissue foreign bodies or soft tissue gas. Soft tissue calcifications in the ankle, likely phleboliths or dystrophic calcification. IMPRESSION: Degenerative changes in the left foot. No acute bony abnormalities. New bone erosions in the fourth metatarsal head are nonspecific and could represent osteomyelitis in the appropriate clinical setting. Electronically Signed   By: Burman Nieves M.D.   On: 05/19/2023 19:53   DG Tibia/Fibula Left  Result Date: 05/19/2023 CLINICAL DATA:  Infected leg wounds for 1 year. EXAM: LEFT TIBIA AND FIBULA - 2 VIEW COMPARISON:  02/17/2023 FINDINGS: Degenerative changes demonstrated in the left knee and left ankle joints. Tibia and fibula appear intact. No evidence of acute fracture or dislocation. No bone sclerosis or cortical erosion to suggest osteomyelitis. There is extensive soft tissue calcification likely representing phleboliths or dystrophic calcification. No soft tissue gas or radiopaque foreign bodies are demonstrated. Similar appearance to previous study. IMPRESSION: 1. Degenerative changes in the knee and ankle. No acute bony abnormalities. 2. Extensive soft tissue calcification,  likely phleboliths or dystrophic. No radiopaque foreign bodies or soft tissue gas. Electronically Signed   By: Burman Nieves M.D.   On: 05/19/2023 19:51    EKG: I independently viewed the EKG done and my findings are as followed: None at the time of this visit.  Assessment/Plan Present on Admission:  Cellulitis  Principal Problem:  Cellulitis  Bilateral lower extremity cellulitis, POA Follows with Dr. Lajoyce Corners outpatient Continue IV antibiotics empirically, IV vancomycin and cefepime. Follow cultures for ID and sensitivities Start gentle IV fluid hydration, NS at 50 cc/h x 1 day.  New bone erosions in the fourth metatarsal head are nonspecific and could represent osteomyelitis in the appropriate clinical setting. Obtain CRP and ESR Consider obtaining MRI of left foot if inflammatory markers are positive Analgesics as needed  Generalized weakness PT OT assessment Fall precautions.  Obesity BMI 37 Recommend weight loss outpatient with regular physical activity and healthy dieting.   Time: 75 minutes.   DVT prophylaxis: Subcu Lovenox daily  Code Status: Full code  Family Communication: Husband at bedside.  Disposition Plan: Admitted to telemetry surgical unit.  Consults called: None.  Admission status: Inpatient status   Status is: Inpatient The patient requires at least 2 midnights for further evaluation and treatment of pedunculation.   Darlin Drop MD [facility] Pager [phone]  If 7PM-7AM, please contact night-coverage [email or web address] Password [email or web address]  05/19/2023, 9:50 PM

## 2023-05-19 NOTE — Progress Notes (Signed)
Pharmacy Antibiotic Note  [name] is a 72 y.o. female admitted on 05/19/2023 with  RLE osteomyelitis .  Pharmacy has been consulted for Cefepime and Vancomycin dosing.  WBC 7.2, afebrile Scr 0.78  Plan: Cefepime 2g IV q8h Initiate loading dose of Vancomycin 1000mg  IV x1 followed by 1500mg  IV x 1 for a total loading dose of 2500mg , then  Vancomycin 1000mg  IV q12h (eAUC ~435)    > Goal AUC 400-550    > Check vancomycin levels at steady state  Monitor daily CBC, temp, SCr, and for clinical signs of improvement  F/u cultures and de-escalate antibiotics as able   Height: 5\' 4"  (162.6 cm) Weight: 127.5 kg (281 lb 1.4 oz) IBW/kg (Calculated) : 54.7  Temp (24hrs), Avg:97.8 F (36.6 C), Min:97.5 F (36.4 C), Max:98 F (36.7 C)  Recent Labs  Lab 05/19/23 1228 05/19/23 1237 05/19/23 1820  WBC 7.3  --   --   CREATININE 0.76  --   --   LATICACIDVEN  --  1.2 1.0    Estimated Creatinine Clearance: 81.6 mL/min (by C-G formula based on SCr of 0.76 mg/dL).    Allergies  Allergen Reactions   Ace Inhibitors Swelling   Latex Itching and Swelling   Tizanidine Other (See Comments)    Tremors    Ultram [Tramadol] Nausea And Vomiting   Diprivan [Propofol] Itching   Metformin And Related Other (See Comments)    Tremors  Chills    Antimicrobials this admission: Cefepime 12/3 >>  Vancomycin 12/3 >>   Dose adjustments this admission: N/A  Microbiology results: 12/3 BCx x2: sent  Thank you for allowing pharmacy to be a part of this patient's care.  Wilburn Cornelia, PharmD, BCPS Clinical Pharmacist 05/19/2023 9:49 PM   Please refer to [facility] for pharmacy phone number

## 2023-05-19 NOTE — ED Provider Notes (Signed)
[place] EMERGENCY DEPARTMENT AT [facility] Provider Note   CSN: [id number] Arrival date & time: 05/19/23  1148     History  LE wounds   [name] is a 72 y.o. female history of personality disorder, IDDM, diabetic foot ulcers, venous stasis insufficiency here for evaluation of leg wounds.  She has been followed by Dr. Audrie Lia office for these.  States over the last few days she has noted increased drainage from her left foot wound.  She was seen by PCP office who thought that she needed admission and IV antibiotics.  Patient states her wound to her right lower extremity has improved however left has gotten worse she has some swelling to her midfoot and digits.  She denies any new numbness.  She states she has chronic weakness.  She has had chills without documented fever.  No recent falls or injuries.  No chest pain, shortness of breath.  Uses a wheelchair at baseline.  States she is not currently being followed by wound care however her family helps to change her dressings.  Irving Burton in room and stated that she has had shaking chills the last 2 nights however they have not checked her temperature.  HPI     Home Medications Prior to Admission medications   Medication Sig Start Date End Date Taking? Authorizing Provider  acetaminophen (TYLENOL) 325 MG tablet Take 2 tablets (650 mg total) by mouth every 6 (six) hours as needed for mild pain (or Fever >/= 101). 06/28/22   Elgergawy, Leana Roe, MD  allopurinol (ZYLOPRIM) 100 MG tablet Take 100 mg by mouth daily. 01/29/23   [provider]  aspirin EC 81 MG tablet Take 81 mg by mouth daily. Swallow whole.    [provider]  atenolol (TENORMIN) 25 MG tablet Take 25-50 mg by mouth See admin instructions. 50 mg every morning, 25 mg at bedtime    [provider]  atorvastatin (LIPITOR) 20 MG tablet Take 20 mg by mouth daily. 01/29/23   [provider]  cyclobenzaprine (FLEXERIL) 5 MG tablet Take 5 mg  by mouth 3 (three) times daily as needed for muscle spasms. 06/03/22   [provider]  donepezil (ARICEPT) 10 MG tablet Take 10 mg by mouth in the morning.    [provider]  gabapentin (NEURONTIN) 300 MG capsule Take 1 capsule (300 mg total) by mouth 2 (two) times daily. 10/29/22   Marguerita Merles [name], DO  HYDROcodone-acetaminophen (NORCO) 10-325 MG tablet Take 1 tablet by mouth 2 (two) times daily as needed for severe pain. 02/05/23   [provider]  lurasidone (LATUDA) 40 MG TABS tablet Take 1 tablet (40 mg total) by mouth daily with breakfast. 06/16/22   Bobbye Morton, MD  NIFEdipine (PROCARDIA XL/NIFEDICAL-XL) 90 MG 24 hr tablet Take 90 mg by mouth in the morning.    [provider]  ondansetron (ZOFRAN) 4 MG tablet Take 1 tablet (4 mg total) by mouth every 6 (six) hours. 07/16/22   Prosperi, Christian H, PA-C  polyethylene glycol powder (GLYCOLAX/MIRALAX) 17 GM/SCOOP powder Take 17 g by mouth daily as needed for mild constipation. 10/29/22   Sheikh, Omair [name], DO  TRESIBA FLEXTOUCH 200 UNIT/ML FlexTouch Pen Inject 30 Units into the skin in the morning and at bedtime. 02/26/23   [provider]      Allergies    Ace inhibitors, Latex, Tizanidine, Ultram [tramadol], Diprivan [propofol], and Metformin and related    Review of Systems  Review of Systems  Constitutional:  Positive for chills. Negative for fatigue and fever.  HENT: Negative.    Respiratory: Negative.    Cardiovascular: Negative.   Gastrointestinal: Negative.   Musculoskeletal: Negative.   Skin:  Positive for wound.  Neurological:  Positive for weakness (chronic).  All other systems reviewed and are negative.   Physical Exam Updated Vital Signs BP 131/70 (BP Location: Right Arm)   Pulse 79   Temp (!) 97.4 F (36.3 C) (Oral)   Resp 16   Ht 5\' 4"  (1.626 m)   Wt 127.5 kg   SpO2 100%   BMI 48.25 kg/m  Physical Exam Vitals and nursing note reviewed.  Constitutional:       General: She is not in acute distress.    Appearance: She is well-developed. She is not ill-appearing, toxic-appearing or diaphoretic.  HENT:     Head: Atraumatic.  Eyes:     Pupils: Pupils are equal, round, and reactive to light.  Cardiovascular:     Rate and Rhythm: Normal rate.     Pulses:          Dorsalis pedis pulses are detected w/ Doppler on the right side and detected w/ Doppler on the left side.     Heart sounds: Normal heart sounds.  Pulmonary:     Effort: No respiratory distress.  Abdominal:     General: There is no distension.  Musculoskeletal:        General: Normal range of motion.     Cervical back: Normal range of motion.     Comments: No bony tenderness, compartments soft, full range of motion  Feet:     Right foot:     Skin integrity: Ulcer, skin breakdown and dry skin present.     Toenail Condition: Right toenails are abnormally thick and long. Fungal disease present.    Left foot:     Skin integrity: Ulcer, skin breakdown, erythema, warmth and dry skin present.     Toenail Condition: Left toenails are abnormally thick and long. Fungal disease present. Skin:    General: Skin is warm and dry.     Comments: See picture in chart, chronic skin changes to bilateral lower extremities, appears to have significant lymphedema, right lower extremity with appears to be healing large ulcerations, scant, dried drainage.  Left lower extremity with thin, yellow drainage, macerated ulceration to dorsum left foot.  Soft tissue swelling about digits.  Neurological:     General: No focal deficit present.     Mental Status: She is alert.  Psychiatric:        Mood and Affect: Mood normal.        ED Results / Procedures / Treatments   Labs (all labs ordered are listed, but only abnormal results are displayed) Labs Reviewed  CBC WITH DIFFERENTIAL/PLATELET - Abnormal; Notable for the following components:      Result Value   RBC 3.50 (*)    Hemoglobin 10.0 (*)    HCT  32.0 (*)    Platelets 467 (*)    All other components within normal limits  COMPREHENSIVE METABOLIC PANEL - Abnormal; Notable for the following components:   Glucose, Bld 102 (*)    BUN 5 (*)    Total Protein 8.8 (*)    Albumin 2.7 (*)    All other components within normal limits  CBC - Abnormal; Notable for the following components:   RBC 3.80 (*)    Hemoglobin 10.6 (*)    HCT  33.8 (*)    All other components within normal limits  CULTURE, BLOOD (ROUTINE X 2)  CULTURE, BLOOD (ROUTINE X 2)  CREATININE, SERUM  CBC  BASIC METABOLIC PANEL  MAGNESIUM  PHOSPHORUS  I-STAT CG4 LACTIC ACID, ED  CBG MONITORING, ED  I-STAT CG4 LACTIC ACID, ED    EKG None  Radiology DG Tibia/Fibula Right  Result Date: 05/19/2023 CLINICAL DATA:  Right lower extremity pain. Concern for infected wounds. EXAM: RIGHT TIBIA AND FIBULA - 2 VIEW COMPARISON:  Right lower extremity radiograph dated 02/17/2023. FINDINGS: There is no acute fracture or dislocation. The bones are osteopenic. Severe arthritic changes of the knee with severe joint space narrowing and spurring. There is diffuse subcutaneous edema and scattered subcutaneous calcification, likely sequela of chronic venous stasis. No radiopaque foreign object or soft tissue gas. IMPRESSION: 1. No acute fracture or dislocation. 2. Diffuse subcutaneous edema. Electronically Signed   By: Elgie Collard M.D.   On: 05/19/2023 19:55   DG Foot Complete Left  Result Date: 05/19/2023 CLINICAL DATA:  Infected leg wounds for 1 year. EXAM: LEFT FOOT - COMPLETE 3+ VIEW COMPARISON:  10/22/2022 FINDINGS: Degenerative changes in the interphalangeal joints, first metatarsal-phalangeal joint, and intertarsal joints. Focal erosion again demonstrated in the first metatarsal head possibly indicating degenerative cyst and unchanged. Developing lucencies at the fourth metatarsal head could indicate osteomyelitis in the appropriate setting. No acute fracture or dislocation. Diffuse  soft tissue swelling. No radiopaque soft tissue foreign bodies or soft tissue gas. Soft tissue calcifications in the ankle, likely phleboliths or dystrophic calcification. IMPRESSION: Degenerative changes in the left foot. No acute bony abnormalities. New bone erosions in the fourth metatarsal head are nonspecific and could represent osteomyelitis in the appropriate clinical setting. Electronically Signed   By: Burman Nieves M.D.   On: 05/19/2023 19:53   DG Tibia/Fibula Left  Result Date: 05/19/2023 CLINICAL DATA:  Infected leg wounds for 1 year. EXAM: LEFT TIBIA AND FIBULA - 2 VIEW COMPARISON:  02/17/2023 FINDINGS: Degenerative changes demonstrated in the left knee and left ankle joints. Tibia and fibula appear intact. No evidence of acute fracture or dislocation. No bone sclerosis or cortical erosion to suggest osteomyelitis. There is extensive soft tissue calcification likely representing phleboliths or dystrophic calcification. No soft tissue gas or radiopaque foreign bodies are demonstrated. Similar appearance to previous study. IMPRESSION: 1. Degenerative changes in the knee and ankle. No acute bony abnormalities. 2. Extensive soft tissue calcification, likely phleboliths or dystrophic. No radiopaque foreign bodies or soft tissue gas. Electronically Signed   By: Burman Nieves M.D.   On: 05/19/2023 19:51    Procedures Procedures    Medications Ordered in ED Medications  enoxaparin (LOVENOX) injection 60 mg (60 mg Subcutaneous Given 05/19/23 2118)  vancomycin (VANCOCIN) IVPB 1000 mg/200 mL premix (has no administration in time range)    Followed by  Vancomycin (VANCOCIN) 1,500 mg in sodium chloride 0.9 % 500 mL IVPB (has no administration in time range)  ceFEPIme (MAXIPIME) 2 g in sodium chloride 0.9 % 100 mL IVPB (has no administration in time range)  vancomycin (VANCOCIN) IVPB 1000 mg/200 mL premix (has no administration in time range)  acetaminophen (TYLENOL) tablet 650 mg (has no  administration in time range)  prochlorperazine (COMPAZINE) injection 5 mg (has no administration in time range)  melatonin tablet 5 mg (has no administration in time range)  polyethylene glycol (MIRALAX / GLYCOLAX) packet 17 g (has no administration in time range)  oxyCODONE (Oxy IR/ROXICODONE) immediate release tablet 5  mg (has no administration in time range)  ceFEPIme (MAXIPIME) 2 g in sodium chloride 0.9 % 100 mL IVPB (2 g Intravenous New Bag/Given 05/19/23 2121)    ED Course/ Medical Decision Making/ A&P Clinical Course as of 05/19/23 2208  Tue May 19, 2023  2039 Dr. Margo Aye with medicine to admit [BH]    Clinical Course User Index [BH] Threasa Kinch A, PA-C  72 year old here for evaluation of acute on chronic wounds.  She is followed by Dr. Lajoyce Corners in the outpatient setting.  She has a chills that documented fever at home.  She states her right lower extremity wounds appear to be healing however over the last week or so she has noted some drainage to her left lower extremity, worse to dorsum of her foot.  She denies any new numbness or weakness, she is primarily wheelchair-bound.  She was seen by her PCP and was noted to have worsening wounds from her baseline and was sent here for further evaluation.  On arrival she is neurovascularly intact, pulses doppler bilaterally.  Will plan on imaging, labs and reassess  Labs and imaging personally viewed and interpreted:  CBC without leukocytosis Metabolic panel albumin 2.7 Lactic acid 1.0, blood cultures obtained X-ray left foot shows bony destruction, possible osteomyelitis X-ray left and right tib-fib show soft tissue swelling  I discussed results with patient and family.  I have started her on IV antibiotics.  I recommend admission for further workup of her suspected osteomyelitis.  Discussed with Dr. Margo Aye with medicine.  Agreeable with admission.  The patient appears reasonably stabilized for admission considering the current resources,  flow, and capabilities available in the ED at this time, and I doubt any other [facility] requiring further screening and/or treatment in the ED prior to admission.                                 Medical Decision Making Amount and/or Complexity of Data Reviewed Independent Historian: spouse External Data Reviewed: labs, radiology and notes. Labs: ordered. Decision-making details documented in ED Course. Radiology: ordered and independent interpretation performed. Decision-making details documented in ED Course.  Risk OTC drugs. Prescription drug management. Parenteral controlled substances. Decision regarding hospitalization. Diagnosis or treatment significantly limited by social determinants of health.          Final Clinical Impression(s) / ED Diagnoses Final diagnoses:  Cellulitis of left lower extremity    Rx / DC Orders ED Discharge Orders     None         Charrie Mcconnon A, PA-C 05/19/23 2209    Benjiman Core, MD 05/19/23 2218

## 2023-05-19 NOTE — ED Provider Triage Note (Signed)
Emergency Medicine Provider Triage Evaluation Note  [name] , a 72 y.o. female  was evaluated in triage.  Pt complains of bilateral lower extremity wounds.  Patient reports chronic history of bilateral lower extremity edema and wounds.  She reports that she was seen in Dr. Audrie Lia office this morning.  She was referred to the ED for likely admission and IV antibiotics.  She is reporting increased drainage from both lower extremities -particularly on the left lower extremity.  Review of Systems  Positive: Increased drainage from bilateral lower extremity ulcerations/wounds Negative: Chest pain, shortness of breath, fever  Physical Exam  BP 136/64 (BP Location: Right Arm)   Pulse 86   Temp 98 F (36.7 C) (Oral)   Resp 15   Ht 5\' 4"  (1.626 m)   Wt 127.5 kg   SpO2 100%   BMI 48.25 kg/m  Gen:   Awake, no distress   Resp:  Normal effort  MSK:   Moves extremities without difficulty  Other:         Medical Decision Making  Medically screening exam initiated at 12:10 PM.  Appropriate orders placed.  [name] was informed that the remainder of the evaluation will be completed by another provider, this initial triage assessment does not replace that evaluation, and the importance of remaining in the ED until their evaluation is complete.  Initial screening labs ordered.  Patient understands MSE process.  RN staff aware of plan of care.   Wynetta Fines, MD 05/19/23 [phone]

## 2023-05-19 NOTE — ED Notes (Signed)
Patient transported to X-ray 

## 2023-05-19 NOTE — ED Notes (Signed)
ED TO INPATIENT HANDOFF REPORT  ED Nurse Name and Phone #: Francene Castle [phone]  S Name/Age/Gender [name] 72 y.o. female Room/Bed: H018C/H018C  Code Status   Code Status: Full Code  Home/SNF/Other Home Patient oriented to: self, place, time, and situation Is this baseline? Yes   Triage Complete: Triage complete  Chief Complaint Cellulitis [L03.90]  Triage Note Patient brought in by [facility] from doctors office for infected leg wounds x1 year. Per doctors office, patient may need abx for wounds. Alert oriented , GCS 15 , hx of diabetes. Wounds noted to bilateral lower legs by this RN, draining clear with mild odor.   VSS with EMS.    Allergies Allergies  Allergen Reactions   Ace Inhibitors Swelling   Latex Itching and Swelling   Tizanidine Other (See Comments)    Tremors    Ultram [Tramadol] Nausea And Vomiting   Diprivan [Propofol] Itching   Metformin And Related Other (See Comments)    Tremors  Chills    Level of Care/Admitting Diagnosis ED Disposition     ED Disposition  Admit   Condition  --   Comment  Hospital Area: [facility] [[id number]]  Level of Care: Telemetry Surgical [105]  May admit patient to [facility] or [facility] if equivalent level of care is available:: No  Covid Evaluation: Asymptomatic - no recent exposure (last 10 days) testing not required  Diagnosis: Cellulitis [147829]  Admitting Physician: Darlin Drop [[id number]]  Attending Physician: Darlin Drop [[id number]]  Certification:: I certify this patient will need inpatient services for at least 2 midnights  Expected Medical Readiness: 05/21/2023          B Medical/Surgery History Past Medical History:  Diagnosis Date   Anxiety    Arthritis    "back, arms, legs" (03/24/2016)   Asthma    Chronic lower back pain    Colonic polyp    last colonoscopy done in 2009 with normal results per medical record   Depressive disorder    Gastric polyp     Gout    has taken allopurinol 300mg  once daily in past   Headache    History of hiatal hernia    Hypertension    Migraine    "none in awhile; might have a couple/year" (03/24/2016)   Mixed hyperlipidemia    01/2016 Total chol 141, HDL 59, LDL 63, ration 1.1   Osteoarthritis    TIA (transient ischemic attack) 11/2014   Type II diabetes mellitus (HCC)    Past Surgical History:  Procedure Laterality Date   ARTERY BIOPSY Right 08/12/2016   Procedure: BIOPSY TEMPORAL ARTERY;  Surgeon: Sherren Kerns, MD;  Location: [facility] OR;  Service: Vascular;  Laterality: Right;  BIOPSY TEMPORAL ARTERY   BREAST BIOPSY Left ~ 2015   benign   CARPAL TUNNEL RELEASE Bilateral    DILATION AND CURETTAGE OF UTERUS     I & D EXTREMITY Bilateral 10/24/2022   Procedure: DEBRIDEMENT LEFT LEG AND RIGHT ANKLE;  Surgeon: Nadara Mustard, MD;  Location: [facility] OR;  Service: Orthopedics;  Laterality: Bilateral;   IR CT HEAD LTD  06/11/2022   IR PERCUTANEOUS ART THROMBECTOMY/INFUSION INTRACRANIAL INC DIAG ANGIO  06/11/2022   IR US GUIDE VASC ACCESS RIGHT  06/11/2022   KNEE ARTHROSCOPY Right 2003   in [facility]   LAPAROSCOPIC CHOLECYSTECTOMY     LOOP RECORDER INSERTION N/A 06/26/2022   Procedure: LOOP RECORDER INSERTION;  Surgeon: Regan Lemming, MD;  Location:  [facility] INVASIVE CV LAB;  Service: Cardiovascular;  Laterality: N/A;   RADIOLOGY WITH ANESTHESIA N/A 06/11/2022   Procedure: IR WITH ANESTHESIA;  Surgeon: Radiologist, Medication, MD;  Location: [facility] OR;  Service: Radiology;  Laterality: N/A;   TUBAL LIGATION     VAGINAL HYSTERECTOMY  1982     A IV Location/Drains/Wounds Patient Lines/Drains/Airways Status     Active Line/Drains/Airways     Name Placement date Placement time Site Days   Peripheral IV 05/19/23 20 G Left Antecubital 05/19/23  1228  Antecubital  less than 1   Wound / Incision (Open or Dehisced) 06/11/22 Venous stasis ulcer Foot Anterior;Right 06/11/22  1428  Foot  342   Wound / Incision (Open or Dehisced)  06/11/22 Venous stasis ulcer Ankle Anterior;Right 06/11/22  1630  Ankle  342   Wound / Incision (Open or Dehisced) 06/11/22 Venous stasis ulcer Foot Anterior;Left 06/11/22  1428  Foot  342   Wound / Incision (Open or Dehisced) 06/11/22 Venous stasis ulcer Foot Anterior;Left;Medial 06/11/22  1428  Foot  342   Wound / Incision (Open or Dehisced) 06/12/22 Venous stasis ulcer;Diabetic ulcer Ankle Left;Posterior 06/12/22  0800  Ankle  341   Wound / Incision (Open or Dehisced) 02/18/23 Venous stasis ulcer Foot Right;Anterior 4 cm x 7 cm x 0.2 cm 60% yellow fibrin 40% pink moist 02/18/23  0800  Foot  90   Wound / Incision (Open or Dehisced) 02/18/23 Venous stasis ulcer Foot Left;Anterior 7 cm x 9 cm x 0.2 cm 50% yellow 50% pink moist 02/18/23  0800  Foot  90   Wound / Incision (Open or Dehisced) 02/18/23 Non-pressure wound Leg Left;Medial;Lower 10 cm x 10 cm 80% pink dry 20% yellow fibrin distal portion 02/18/23  1004  Leg  90            Intake/Output Last 24 hours No intake or output data in the 24 hours ending 05/19/23 2055  Labs/Imaging Results for orders placed or performed during the hospital encounter of 05/19/23 (from the past 48 hour(s))  CBC with Differential     Status: Abnormal   Collection Time: 05/19/23 12:28 PM  Result Value Ref Range   WBC 7.3 4.0 - 10.5 K/uL   RBC 3.50 (L) 3.87 - 5.11 MIL/uL   Hemoglobin 10.0 (L) 12.0 - 15.0 g/dL   HCT 01.0 (L) 27.2 - 53.6 %   MCV 91.4 80.0 - 100.0 fL   MCH 28.6 26.0 - 34.0 pg   MCHC 31.3 30.0 - 36.0 g/dL   RDW 64.4 03.4 - 74.2 %   Platelets 467 (H) 150 - 400 K/uL   nRBC 0.0 0.0 - 0.2 %   Neutrophils Relative % 61 %   Neutro Abs 4.5 1.7 - 7.7 K/uL   Lymphocytes Relative 29 %   Lymphs Abs 2.1 0.7 - 4.0 K/uL   Monocytes Relative 6 %   Monocytes Absolute 0.4 0.1 - 1.0 K/uL   Eosinophils Relative 3 %   Eosinophils Absolute 0.2 0.0 - 0.5 K/uL   Basophils Relative 1 %   Basophils Absolute 0.0 0.0 - 0.1 K/uL   Immature Granulocytes 0 %    Abs Immature Granulocytes 0.03 0.00 - 0.07 K/uL    Comment: Performed at [facility], [address], [place], [place] [address]  Comprehensive metabolic panel     Status: Abnormal   Collection Time: 05/19/23 12:28 PM  Result Value Ref Range   Sodium 138 135 - 145 mmol/L   Potassium 4.2  3.5 - 5.1 mmol/L    Comment: HEMOLYSIS AT THIS LEVEL MAY AFFECT RESULT   Chloride 105 98 - 111 mmol/L   CO2 22 22 - 32 mmol/L   Glucose, Bld 102 (H) 70 - 99 mg/dL    Comment: Glucose reference range applies only to samples taken after fasting for at least 8 hours.   BUN 5 (L) 8 - 23 mg/dL   Creatinine, Ser 2.35 0.44 - 1.00 mg/dL   Calcium 9.3 8.9 - 57.3 mg/dL   Total Protein 8.8 (H) 6.5 - 8.1 g/dL   Albumin 2.7 (L) 3.5 - 5.0 g/dL   AST 15 15 - 41 U/L    Comment: HEMOLYSIS AT THIS LEVEL MAY AFFECT RESULT   ALT 8 0 - 44 U/L    Comment: HEMOLYSIS AT THIS LEVEL MAY AFFECT RESULT   Alkaline Phosphatase 81 38 - 126 U/L   Total Bilirubin 0.7 <1.2 mg/dL    Comment: HEMOLYSIS AT THIS LEVEL MAY AFFECT RESULT   GFR, Estimated >60 >60 mL/min    Comment: (NOTE) Calculated using the CKD-EPI Creatinine Equation (2021)    Anion gap 11 5 - 15    Comment: Performed at [facility], [address], [place], [place] [address]  CBG monitoring, ED     Status: None   Collection Time: 05/19/23 12:31 PM  Result Value Ref Range   Glucose-Capillary 97 70 - 99 mg/dL    Comment: Glucose reference range applies only to samples taken after fasting for at least 8 hours.  I-Stat Lactic Acid     Status: None   Collection Time: 05/19/23 12:37 PM  Result Value Ref Range   Lactic Acid, Venous 1.2 0.5 - 1.9 mmol/L  I-Stat Lactic Acid     Status: None   Collection Time: 05/19/23  6:20 PM  Result Value Ref Range   Lactic Acid, Venous 1.0 0.5 - 1.9 mmol/L   DG Tibia/Fibula Right  Result Date: 05/19/2023 CLINICAL DATA:  Right lower extremity pain. Concern for infected wounds. EXAM: RIGHT TIBIA AND FIBULA - 2 VIEW  COMPARISON:  Right lower extremity radiograph dated 02/17/2023. FINDINGS: There is no acute fracture or dislocation. The bones are osteopenic. Severe arthritic changes of the knee with severe joint space narrowing and spurring. There is diffuse subcutaneous edema and scattered subcutaneous calcification, likely sequela of chronic venous stasis. No radiopaque foreign object or soft tissue gas. IMPRESSION: 1. No acute fracture or dislocation. 2. Diffuse subcutaneous edema. Electronically Signed   By: Elgie Collard M.D.   On: 05/19/2023 19:55   DG Foot Complete Left  Result Date: 05/19/2023 CLINICAL DATA:  Infected leg wounds for 1 year. EXAM: LEFT FOOT - COMPLETE 3+ VIEW COMPARISON:  10/22/2022 FINDINGS: Degenerative changes in the interphalangeal joints, first metatarsal-phalangeal joint, and intertarsal joints. Focal erosion again demonstrated in the first metatarsal head possibly indicating degenerative cyst and unchanged. Developing lucencies at the fourth metatarsal head could indicate osteomyelitis in the appropriate setting. No acute fracture or dislocation. Diffuse soft tissue swelling. No radiopaque soft tissue foreign bodies or soft tissue gas. Soft tissue calcifications in the ankle, likely phleboliths or dystrophic calcification. IMPRESSION: Degenerative changes in the left foot. No acute bony abnormalities. New bone erosions in the fourth metatarsal head are nonspecific and could represent osteomyelitis in the appropriate clinical setting. Electronically Signed   By: Burman Nieves M.D.   On: 05/19/2023 19:53   DG Tibia/Fibula Left  Result Date: 05/19/2023 CLINICAL DATA:  Infected leg wounds for  1 year. EXAM: LEFT TIBIA AND FIBULA - 2 VIEW COMPARISON:  02/17/2023 FINDINGS: Degenerative changes demonstrated in the left knee and left ankle joints. Tibia and fibula appear intact. No evidence of acute fracture or dislocation. No bone sclerosis or cortical erosion to suggest osteomyelitis. There  is extensive soft tissue calcification likely representing phleboliths or dystrophic calcification. No soft tissue gas or radiopaque foreign bodies are demonstrated. Similar appearance to previous study. IMPRESSION: 1. Degenerative changes in the knee and ankle. No acute bony abnormalities. 2. Extensive soft tissue calcification, likely phleboliths or dystrophic. No radiopaque foreign bodies or soft tissue gas. Electronically Signed   By: Burman Nieves M.D.   On: 05/19/2023 19:51    Pending Labs Unresulted Labs (From admission, onward)     Start     Ordered   05/26/23 0500  Creatinine, serum  (enoxaparin (LOVENOX)    CrCl >/= 30 ml/min)  Weekly,   R     Comments: while on enoxaparin therapy    05/19/23 2047   05/19/23 2047  CBC  (enoxaparin (LOVENOX)    CrCl >/= 30 ml/min)  Once,   R       Comments: Baseline for enoxaparin therapy IF NOT ALREADY DRAWN.  Notify MD if PLT < 100 K.    05/19/23 2047   05/19/23 2047  Creatinine, serum  (enoxaparin (LOVENOX)    CrCl >/= 30 ml/min)  Once,   R       Comments: Baseline for enoxaparin therapy IF NOT ALREADY DRAWN.    05/19/23 2047   05/19/23 1212  Culture, blood (routine x 2)  BLOOD CULTURE X 2,   R      05/19/23 1211            Vitals/Pain Today's Vitals   05/19/23 1155 05/19/23 1539 05/19/23 1812 05/19/23 1902  BP: 136/64 (!) 150/86  138/66  Pulse: 86 73  74  Resp: 15 15  12   Temp: 98 F (36.7 C) (!) 97.5 F (36.4 C) 97.7 F (36.5 C) 97.8 F (36.6 C)  TempSrc: Oral  Oral Oral  SpO2: 100% 100%  100%  Weight:      Height:      PainSc:        Isolation Precautions No active isolations  Medications Medications  enoxaparin (LOVENOX) injection 60 mg (has no administration in time range)    Mobility manual wheelchair     Focused Assessments    R Recommendations: See Admitting Provider Note  Report given to:   Additional Notes:

## 2023-05-19 NOTE — ED Triage Notes (Addendum)
Patient brought in by [facility] from doctors office for infected leg wounds x1 year. Per doctors office, patient may need abx for wounds. Alert oriented , GCS 15 , hx of diabetes. Wounds noted to bilateral lower legs by this RN, draining clear with mild odor.   VSS with EMS.

## 2023-05-20 ENCOUNTER — Encounter (HOSPITAL_COMMUNITY): Payer: Self-pay | Admitting: Internal Medicine

## 2023-05-20 DIAGNOSIS — I872 Venous insufficiency (chronic) (peripheral): Secondary | ICD-10-CM

## 2023-05-20 DIAGNOSIS — L98499 Non-pressure chronic ulcer of skin of other sites with unspecified severity: Secondary | ICD-10-CM

## 2023-05-20 DIAGNOSIS — L03116 Cellulitis of left lower limb: Secondary | ICD-10-CM

## 2023-05-20 DIAGNOSIS — M1A0790 Idiopathic chronic gout, unspecified ankle and foot, without tophus (tophi): Secondary | ICD-10-CM | POA: Diagnosis not present

## 2023-05-20 LAB — BASIC METABOLIC PANEL
Anion gap: 5 (ref 5–15)
BUN: <5 mg/dL — ABNORMAL LOW (ref 8–23)
CO2: 23 mmol/L (ref 22–32)
Calcium: 8.8 mg/dL — ABNORMAL LOW (ref 8.9–10.3)
Chloride: 111 mmol/L (ref 98–111)
Creatinine, Ser: 0.83 mg/dL (ref 0.44–1.00)
GFR, Estimated: >60 mL/min (ref >60)
Glucose, Bld: 103 mg/dL — ABNORMAL HIGH (ref 70–99)
Potassium: 3.8 mmol/L (ref 3.5–5.1)
Sodium: 139 mmol/L (ref 135–145)

## 2023-05-20 LAB — CBC
HCT: 33.3 % — ABNORMAL LOW (ref 36.0–46.0)
Hemoglobin: 10.4 g/dL — ABNORMAL LOW (ref 12.0–15.0)
MCH: 27.9 pg (ref 26.0–34.0)
MCHC: 31.2 g/dL (ref 30.0–36.0)
MCV: 89.3 fL (ref 80.0–100.0)
Platelets: 418 10*3/uL — ABNORMAL HIGH (ref 150–400)
RBC: 3.73 MIL/uL — ABNORMAL LOW (ref 3.87–5.11)
RDW: 14.6 % (ref 11.5–15.5)
WBC: 7.4 10*3/uL (ref 4.0–10.5)
nRBC: 0 % (ref 0.0–0.2)

## 2023-05-20 LAB — PHOSPHORUS: Phosphorus: 3.3 mg/dL (ref 2.5–4.6)

## 2023-05-20 LAB — URIC ACID: Uric Acid, Serum: 7.7 mg/dL — ABNORMAL HIGH (ref 2.5–7.1)

## 2023-05-20 LAB — SEDIMENTATION RATE: Sed Rate: 110 mm/h — ABNORMAL HIGH (ref 0–22)

## 2023-05-20 LAB — C-REACTIVE PROTEIN: CRP: 4.7 mg/dL — ABNORMAL HIGH (ref <1.0)

## 2023-05-20 LAB — MAGNESIUM: Magnesium: 2.1 mg/dL (ref 1.7–2.4)

## 2023-05-20 MED ORDER — ENOXAPARIN SODIUM 60 MG/0.6ML IJ SOSY
50.0000 mg | PREFILLED_SYRINGE | INTRAMUSCULAR | Status: DC
  Administered 2023-05-20 – 2023-05-23 (×4): 50 mg via SUBCUTANEOUS
  Filled 2023-05-20 (×4): qty 0.6

## 2023-05-20 MED ORDER — ATORVASTATIN CALCIUM 10 MG PO TABS
20.0000 mg | ORAL_TABLET | Freq: Every day | ORAL | Status: DC
  Administered 2023-05-20 – 2023-05-24 (×5): 20 mg via ORAL
  Filled 2023-05-20 (×5): qty 2

## 2023-05-20 MED ORDER — VANCOMYCIN HCL 1250 MG/250ML IV SOLN
1250.0000 mg | INTRAVENOUS | Status: DC
  Administered 2023-05-21 – 2023-05-24 (×4): 1250 mg via INTRAVENOUS
  Filled 2023-05-20 (×5): qty 250

## 2023-05-20 MED ORDER — SODIUM CHLORIDE 0.9 % IV SOLN: INTRAVENOUS | Status: AC

## 2023-05-20 MED ORDER — COLCHICINE 0.6 MG PO TABS
0.6000 mg | ORAL_TABLET | Freq: Every day | ORAL | Status: DC
  Administered 2023-05-20 – 2023-05-24 (×5): 0.6 mg via ORAL
  Filled 2023-05-20 (×5): qty 1

## 2023-05-20 NOTE — Progress Notes (Signed)
Pharmacy Antibiotic Note  [name] is a 72 y.o. female admitted on 05/19/2023 with B/L LE cellulitis. Pharmacy has been consulted for cefepime and vancomycin dosing.  SCr up to 0.81, afebrile, WBC WNL Weight updated to 97.8 >> repeat 97.5 kg  Plan: Reduce vanc to 1250mg  IV Q24H for AUC 545 using SCr 0.83 Continue cefepime 2gm IV Q8H Monitor renal fxn, clinical progress, abx de-escalation plan vs check vanc levels at Css  Height: 5\' 4"  (162.6 cm) Weight: 97.8 kg (215 lb 9.8 oz) IBW/kg (Calculated) : 54.7  Temp (24hrs), Avg:98.1 F (36.7 C), Min:97.4 F (36.3 C), Max:99.1 F (37.3 C)  Recent Labs  Lab 05/19/23 1228 05/19/23 1237 05/19/23 1820 05/19/23 2110 05/20/23 0635  WBC 7.3  --   --  7.2 7.4  CREATININE 0.76  --   --  0.78 0.83  LATICACIDVEN  --  1.2 1.0  --   --     Estimated Creatinine Clearance: 67.5 mL/min (by C-G formula based on SCr of 0.83 mg/dL).    Allergies  Allergen Reactions   Ace Inhibitors Swelling   Latex Itching and Swelling   Tizanidine Other (See Comments)    Tremors    Ultram [Tramadol] Nausea And Vomiting   Diprivan [Propofol] Itching   Metformin And Related Other (See Comments)    Tremors  Chills    Cefepime 12/3 >>  Vanc 12/3 >>    12/3 BCx -   [name] D. Laney Potash, PharmD, BCPS, BCCCP 05/20/2023, 10:10 AM

## 2023-05-20 NOTE — Evaluation (Signed)
Occupational Therapy Evaluation Patient Details Name: [name] MRN: [id number] DOB: [birth year] Today's Date: 05/20/2023   History of Present Illness 72 y.o. female [facility] w/ worsening LE wounds. Admitted w/ B LE cellulitis, new bone erosions in 4th metatarsal head of L foot. Prior admit 10/23/22 for I&D BLE venous and lymphatic insufficiency ulcers. PMH: HTN, OA, gout, HLD, TIA, DM2, chronic LBP, PAD   Clinical Impression   Pt admitted for above, she presents as generally weak in BUEs/BLEs. Pt noted to have some drainage and open wounds on her BLEs, which are tender to touch along the plantar aspect. Pt able to complete stand pivots to recliner with CGA, at baseline she pivots to a power w/c per her statement (likely a scooter). Pt able to engage in seated UB ADLs with setup assist. OT continue to follow pt acutely to ensure she progress well to safely DC home and educated the family on any strategies for assist prn. Patient would benefit from post acute Home OT services to help maximize functional independence in natural environment. She may benefit from some hydroptherapy for her BLE wounds.        If plan is discharge home, recommend the following: A little help with walking and/or transfers;A lot of help with bathing/dressing/bathroom;Assistance with cooking/housework;Assist for transportation;Help with stairs or ramp for entrance    Functional Status Assessment  Patient has had a recent decline in their functional status and demonstrates the ability to make significant improvements in function in a reasonable and predictable amount of time.  Equipment Recommendations  None recommended by OT (Pt has rec DME)    Recommendations for Other Services       Precautions / Restrictions Precautions Precautions: Fall Restrictions Weight Bearing Restrictions: No      Mobility Bed Mobility Overal bed mobility: Needs Assistance Bed Mobility: Supine to Sit     Supine  to sit: Min assist, Used rails, HOB elevated     General bed mobility comments: Min A to raise trunk into upright position    Transfers Overall transfer level: Needs assistance   Transfers: Sit to/from Stand, Bed to chair/wheelchair/BSC Sit to Stand: Contact guard assist Stand pivot transfers: Contact guard assist         General transfer comment: Cues for hand placement safety when perform transfers but pt has her own preference of performing it      Balance Overall balance assessment: Needs assistance Sitting-balance support: No upper extremity supported, Feet supported Sitting balance-Leahy Scale: Good     Standing balance support: During functional activity, Single extremity supported Standing balance-Leahy Scale: Poor Standing balance comment: relies on external support                           ADL either performed or assessed with clinical judgement   ADL Overall ADL's : Needs assistance/impaired Eating/Feeding: Independent;Sitting   Grooming: Sitting;Set up;Oral care   Upper Body Bathing: Set up;Sitting   Lower Body Bathing: Moderate assistance;Sitting/lateral leans   Upper Body Dressing : Sitting;Set up   Lower Body Dressing: Total assistance;Sitting/lateral leans   Toilet Transfer: Contact guard assist;Stand-pivot;BSC/3in1 Statistician Details (indicate cue type and reason): simulated with recliner transfer Toileting- Clothing Manipulation and Hygiene: Sit to/from stand;Moderate assistance               Vision         Perception         Praxis  Pertinent Vitals/Pain Pain Assessment Pain Assessment: Faces Faces Pain Scale: Hurts little more Pain Location: BLEs, specifically along the plantar aspect Pain Descriptors / Indicators: Sore, Tender, Guarding, Grimacing, Aching Pain Intervention(s): Limited activity within patient's tolerance, Monitored during session, Repositioned     Extremity/Trunk Assessment Upper  Extremity Assessment Upper Extremity Assessment: Generalized weakness   Lower Extremity Assessment Lower Extremity Assessment: Generalized weakness (BLEs tender to touch along plantar aspect, open wounds with mild drainaige on BLEs.)   Cervical / Trunk Assessment Cervical / Trunk Assessment: Normal   Communication Communication Communication: No apparent difficulties Cueing Techniques: Verbal cues   Cognition Arousal: Alert Behavior During Therapy: WFL for tasks assessed/performed Overall Cognitive Status: Within Functional Limits for tasks assessed                                       General Comments  BLE wounds    Exercises     Shoulder Instructions      Home Living Family/patient expects to be discharged to:: Private residence Living Arrangements: Spouse/significant other Available Help at Discharge: Available 24 hours/day;Family Type of Home: Apartment Home Access: Ramped entrance     Home Layout: One level     Bathroom Shower/Tub: Chief Strategy Officer: Standard Bathroom Accessibility: Yes How Accessible: Accessible via wheelchair Home Equipment: Wheelchair - power;Wheelchair - manual;Transport chair;Hospital bed;Grab bars - toilet;Grab bars - tub/shower;Hand held Armed forces logistics/support/administrative officer (2 wheels)          Prior Functioning/Environment Prior Level of Function : Needs assist       Physical Assist : Mobility (physical);ADLs (physical) Mobility (physical): Transfers ADLs (physical): IADLs;Dressing Mobility Comments: Transfer to power chair via stand pivots ADLs Comments: occasional assist from husband with LBD, sponge bath        OT Problem List: Impaired balance (sitting and/or standing);Pain;Increased edema;Decreased strength      OT Treatment/Interventions: Self-care/ADL training;Balance training;Therapeutic exercise;Therapeutic activities;Patient/family education    OT Goals(Current goals can be found  in the care plan section) Acute Rehab OT Goals Patient Stated Goal: To get feet better OT Goal Formulation: With patient Time For Goal Achievement: 06/03/23 Potential to Achieve Goals: Fair ADL Goals Pt Will Perform Lower Body Bathing: with set-up;with adaptive equipment;sitting/lateral leans Pt Will Perform Lower Body Dressing: with caregiver independent in assisting;with set-up Pt Will Transfer to Toilet: stand pivot transfer;with supervision;bedside commode Additional ADL Goal #2: Pt caregiver will identify 3 strategies to provide improved wound care to BLEs  OT Frequency: Min 1X/week    Co-evaluation              AM-PAC OT "6 Clicks" Daily Activity     Outcome Measure Help from another person eating meals?: None Help from another person taking care of personal grooming?: A Little Help from another person toileting, which includes using toliet, bedpan, or urinal?: A Lot Help from another person bathing (including washing, rinsing, drying)?: A Lot Help from another person to put on and taking off regular upper body clothing?: A Little Help from another person to put on and taking off regular lower body clothing?: Total 6 Click Score: 15   End of Session Nurse Communication: Mobility status  Activity Tolerance: Patient tolerated treatment well Patient left: in chair;with call bell/phone within reach;with chair alarm set  OT Visit Diagnosis: Pain;Unsteadiness on feet (R26.81);Muscle weakness (generalized) (M62.81) Pain - Right/Left:  (bilat) Pain - part of body:  (  feet)                Time: 1610-9604 OT Time Calculation (min): 37 min Charges:  OT General Charges $OT Visit: 1 Visit OT Evaluation $OT Eval Moderate Complexity: 1 Mod OT Treatments $Therapeutic Activity: 8-22 mins  05/20/2023  AB, OTR/L  Acute Rehabilitation Services  Office: [phone]   Tristan Schroeder 05/20/2023, 3:26 PM

## 2023-05-20 NOTE — Progress Notes (Signed)
[facility]  PROGRESS NOTE  [name] WUJ:[id number] DOB: March 31, 1951 DOA: 05/19/2023 PCP: Hillery Aldo, NP   Brief HPI:   72 y.o. female with medical history significant for chronic bilateral lower extremity wounds, type 2 diabetes, hyperlipidemia, hypertension, obesity, who presented to the ED, sent from her PCPs office due to worsening leg wounds..  Associated with subjective intermittent fevers.     Assessment/Plan:    Bilateral lower extremity cellulitis, POA Follows with Dr. Lajoyce Corners outpatient Continue IV antibiotics empirically, IV vancomycin and cefepime. Follow cultures for ID and sensitivities    New bone erosions in the fourth metatarsal head are nonspecific and could represent osteomyelitis in the appropriate clinical setting. -Orthopedics consulted -Daughter noticed does not think that this is osteomyelitis, he feels this is gout -Colchicine ordered -Also knee-high compression socks ordered per Dr. Lajoyce Corners   Generalized weakness PT OT assessment Fall precautions.   Obesity BMI 37 Recommend weight loss outpatient with regular physical activity and healthy dieting.   Medications     atorvastatin  20 mg Oral Daily   enoxaparin (LOVENOX) injection  50 mg Subcutaneous Q24H     Data Reviewed:   CBG:  Recent Labs  Lab 05/19/23 1231  GLUCAP 97    SpO2: 98 %    Vitals:   05/19/23 2141 05/19/23 2259 05/20/23 0330 05/20/23 0724  BP: 131/70 (!) 128/59 114/64 130/78  Pulse: 79 78 91 98  Resp: 16 18 18 16   Temp: (!) 97.4 F (36.3 C) 98.2 F (36.8 C) 99.1 F (37.3 C) 98.8 F (37.1 C)  TempSrc: Oral Axillary Oral Oral  SpO2: 100% 100% 100% 98%  Weight:  97.8 kg    Height:          Data Reviewed:  Basic Metabolic Panel: Recent Labs  Lab 05/19/23 1228 05/19/23 2110 05/20/23 0635  NA 138  --  139  K 4.2  --  3.8  CL 105  --  111  CO2 22  --  23  GLUCOSE 102*  --  103*  BUN 5*  --  <5*  CREATININE 0.76 0.78 0.83  CALCIUM 9.3  --   8.8*  MG  --   --  2.1  PHOS  --   --  3.3    CBC: Recent Labs  Lab 05/19/23 1228 05/19/23 2110 05/20/23 0635  WBC 7.3 7.2 7.4  NEUTROABS 4.5  --   --   HGB 10.0* 10.6* 10.4*  HCT 32.0* 33.8* 33.3*  MCV 91.4 88.9 89.3  PLT 467* 396 418*    LFT Recent Labs  Lab 05/19/23 1228  AST 15  ALT 8  ALKPHOS 81  BILITOT 0.7  PROT 8.8*  ALBUMIN 2.7*     Antibiotics: Anti-infectives (From admission, onward)    Start     Dose/Rate Route Frequency Ordered Stop   05/21/23 0600  vancomycin (VANCOREADY) IVPB 1250 mg/250 mL        1,250 mg 166.7 mL/hr over 90 Minutes Intravenous Every 24 hours 05/20/23 1009     05/20/23 1000  vancomycin (VANCOCIN) IVPB 1000 mg/200 mL premix  Status:  Discontinued        1,000 mg 200 mL/hr over 60 Minutes Intravenous Every 12 hours 05/19/23 2147 05/20/23 1009   05/20/23 0500  ceFEPIme (MAXIPIME) 2 g in sodium chloride 0.9 % 100 mL IVPB        2 g 200 mL/hr over 30 Minutes Intravenous Every 8 hours 05/19/23 2110     05/19/23 2230  Vancomycin (VANCOCIN) 1,500 mg in sodium chloride 0.9 % 500 mL IVPB       Placed in "Followed by" Linked Group   1,500 mg 250 mL/hr over 120 Minutes Intravenous  Once 05/19/23 2103 05/20/23 1138   05/19/23 2115  vancomycin (VANCOCIN) IVPB 1000 mg/200 mL premix       Placed in "Followed by" Linked Group   1,000 mg 200 mL/hr over 60 Minutes Intravenous  Once 05/19/23 2103 05/19/23 2326   05/19/23 2100  ceFEPIme (MAXIPIME) 2 g in sodium chloride 0.9 % 100 mL IVPB        2 g 200 mL/hr over 30 Minutes Intravenous  Once 05/19/23 2103 05/19/23 2222        DVT prophylaxis: Lovenox  Code Status: Full code  Family Communication: Discussed with family member at bedside   CONSULTS orthopedics   Subjective   Denies pain   Objective    Physical Examination:   General-appears in no acute distress Heart-S1-S2, regular, no murmur auscultated Lungs-clear to auscultation bilaterally, no wheezing or crackles  auscultated Abdomen-soft, nontender, no organomegaly Extremities-multiple open wounds noted in lower extremities Neuro-alert, oriented x3, no focal deficit noted   Status is: Inpatient:             [name]   [facility] If 7PM-7AM, please contact night-coverage at [email or web address], Office  [phone]   05/20/2023, 12:06 PM  LOS: 1 day

## 2023-05-20 NOTE — Consult Note (Addendum)
WOC Nurse Consult Note: Reason for Consult: Consult requested for bilat legs.  Performed remotely after review of progress notes and photos in the EMR.  Pt is followed by Dr Lajoyce Corners of the ortho service as an outpatient prior to admission, most recently on 11/19 and was using dry dressings and ace wrap, until wounds developed increased drainage. X-rays indicate possible osteomyelitis; this complex medical condition is beyond the scope of practice for WOC nurses. Please refer to ortho service for further plan of care.  Wound type: Bilat legs and feet with patchy areas of yellow moist chronic full thickness wounds and also scattered areas of pink scar tissue from areas which have healed. Mod amt yellow drainage and generalized edema and erythremia.  Pt is on systemic antibiotics for cellulitis.  Dressing procedure/placement/frequency: Topical treatment orders provided for beside nurses to perform as follows to absorb drainage and provide light compression: Apply Xeroform gauze to bilat legs Q day, then cover with ABD pads and kerlex, beginning just behind toes to below knees in a spiral fashion, then Ace wrap in the same manner.  Please re-consult if further assistance is needed.  Thank-you,  Cammie Mcgee MSN, RN, CWOCN, [place], CNS [phone]

## 2023-05-20 NOTE — Consult Note (Signed)
ORTHOPAEDIC CONSULTATION  REQUESTING PHYSICIAN: [name], MD  Chief Complaint: Cellulitis left leg.  HPI: [name] is a 72 y.o. 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  Patient also has a history of gout and her family has a history of gout as well.  Past Medical History:  Diagnosis Date   Anxiety    Arthritis    "back, arms, legs" (03/24/2016)   Asthma    Chronic lower back pain    Colonic polyp    last colonoscopy done in 2009 with normal results per medical record   Depressive disorder    Gastric polyp    Gout    has taken allopurinol 300mg  once daily in past   Headache    History of hiatal hernia    Hypertension    Migraine    "none in awhile; might have a couple/year" (03/24/2016)   Mixed hyperlipidemia    01/2016 Total chol 141, HDL 59, LDL 63, ration 1.1   Osteoarthritis    TIA (transient ischemic attack) 11/2014   Type II diabetes mellitus (HCC)    Past Surgical History:  Procedure Laterality Date   ARTERY BIOPSY Right 08/12/2016   Procedure: BIOPSY TEMPORAL ARTERY;  Surgeon: Sherren Kerns, MD;  Location: [facility] OR;  Service: Vascular;  Laterality: Right;  BIOPSY TEMPORAL ARTERY   BREAST BIOPSY Left ~ 2015   benign   CARPAL TUNNEL RELEASE Bilateral    DILATION AND CURETTAGE OF UTERUS     I & D EXTREMITY Bilateral 10/24/2022   Procedure: DEBRIDEMENT LEFT LEG AND RIGHT ANKLE;  Surgeon: Nadara Mustard, MD;  Location: [facility] OR;  Service: Orthopedics;  Laterality: Bilateral;   IR CT HEAD LTD  06/11/2022   IR PERCUTANEOUS ART THROMBECTOMY/INFUSION INTRACRANIAL INC DIAG ANGIO  06/11/2022   IR US GUIDE VASC ACCESS RIGHT  06/11/2022   KNEE ARTHROSCOPY Right 2003   in [facility]   LAPAROSCOPIC CHOLECYSTECTOMY     LOOP RECORDER INSERTION N/A 06/26/2022   Procedure: LOOP RECORDER INSERTION;  Surgeon: Regan Lemming, MD;  Location: [facility] INVASIVE CV LAB;  Service:  Cardiovascular;  Laterality: N/A;   RADIOLOGY WITH ANESTHESIA N/A 06/11/2022   Procedure: IR WITH ANESTHESIA;  Surgeon: Radiologist, Medication, MD;  Location: [facility] OR;  Service: Radiology;  Laterality: N/A;   TUBAL LIGATION     VAGINAL HYSTERECTOMY  1982   Social History   Socioeconomic History   Marital status: Married    Spouse name: Not on file   Number of children: Not on file   Years of education: 12+   Highest education level: Not on file  Occupational History   Not on file  Tobacco Use   Smoking status: Never   Smokeless tobacco: Never  Vaping Use   Vaping status: Never Used  Substance and Sexual Activity   Alcohol use: No   Drug use: No   Sexual activity: Never  Other Topics Concern   Not on file  Social History Narrative   Right handed   Caffeine use: none   Lives with husband   Recently moved from [place]   Social Determinants of Health   Financial Resource Strain: Not on file  Food Insecurity: No Food Insecurity (05/19/2023)   Hunger Vital Sign    Worried About Running Out of Food in the Last Year: Never true    Ran Out of Food in the Last Year:  Never true  Transportation Needs: No Transportation Needs (05/19/2023)   PRAPARE - Administrator, Civil Service (Medical): No    Lack of Transportation (Non-Medical): No  Physical Activity: Not on file  Stress: Not on file  Social Connections: Not on file   Family History  Problem Relation Age of Onset   Stroke Father    Stroke Brother    Cancer Other    Gout Mother    Hypertension Mother    Arthritis Mother    Asthma Mother    Stroke Brother    - negative except otherwise stated in the family history section Allergies  Allergen Reactions   Ace Inhibitors Swelling   Latex Itching and Swelling   Tizanidine Other (See Comments)    Tremors    Ultram [Tramadol] Nausea And Vomiting   Diprivan [Propofol] Itching   Metformin And Related Other (See Comments)    Tremors  Chills   Prior  to Admission medications   Medication Sig Start Date End Date Taking? Authorizing Provider  acetaminophen (TYLENOL) 325 MG tablet Take 2 tablets (650 mg total) by mouth every 6 (six) hours as needed for mild pain (or Fever >/= 101). 06/28/22   Elgergawy, Leana Roe, MD  allopurinol (ZYLOPRIM) 100 MG tablet Take 100 mg by mouth daily. 01/29/23   [provider]  aspirin EC 81 MG tablet Take 81 mg by mouth daily. Swallow whole.    [provider]  atenolol (TENORMIN) 25 MG tablet Take 25-50 mg by mouth See admin instructions. 50 mg every morning, 25 mg at bedtime    [provider]  atorvastatin (LIPITOR) 20 MG tablet Take 20 mg by mouth daily. 01/29/23   [provider]  cyclobenzaprine (FLEXERIL) 5 MG tablet Take 5 mg by mouth 3 (three) times daily as needed for muscle spasms. 06/03/22   [provider]  donepezil (ARICEPT) 10 MG tablet Take 10 mg by mouth in the morning.    [provider]  gabapentin (NEURONTIN) 300 MG capsule Take 1 capsule (300 mg total) by mouth 2 (two) times daily. 10/29/22   Marguerita Merles [name], DO  HYDROcodone-acetaminophen (NORCO) 10-325 MG tablet Take 1 tablet by mouth 2 (two) times daily as needed for severe pain. 02/05/23   [provider]  lurasidone (LATUDA) 40 MG TABS tablet Take 1 tablet (40 mg total) by mouth daily with breakfast. 06/16/22   Bobbye Morton, MD  NIFEdipine (PROCARDIA XL/NIFEDICAL-XL) 90 MG 24 hr tablet Take 90 mg by mouth in the morning.    [provider]  ondansetron (ZOFRAN) 4 MG tablet Take 1 tablet (4 mg total) by mouth every 6 (six) hours. 07/16/22   Prosperi, Christian H, PA-C  polyethylene glycol powder (GLYCOLAX/MIRALAX) 17 GM/SCOOP powder Take 17 g by mouth daily as needed for mild constipation. 10/29/22   Sheikh, Omair [name], DO  TRESIBA FLEXTOUCH 200 UNIT/ML FlexTouch Pen Inject 30 Units into the skin in the morning and at bedtime. 02/26/23   [provider]   DG  Tibia/Fibula Right  Result Date: 05/19/2023 CLINICAL DATA:  Right lower extremity pain. Concern for infected wounds. EXAM: RIGHT TIBIA AND FIBULA - 2 VIEW COMPARISON:  Right lower extremity radiograph dated 02/17/2023. FINDINGS: There is no acute fracture or dislocation. The bones are osteopenic. Severe arthritic changes of the knee with severe joint space narrowing and spurring. There is diffuse subcutaneous edema and scattered subcutaneous calcification, likely sequela of chronic venous stasis. No radiopaque foreign object or  soft tissue gas. IMPRESSION: 1. No acute fracture or dislocation. 2. Diffuse subcutaneous edema. Electronically Signed   By: Elgie Collard M.D.   On: 05/19/2023 19:55   DG Foot Complete Left  Result Date: 05/19/2023 CLINICAL DATA:  Infected leg wounds for 1 year. EXAM: LEFT FOOT - COMPLETE 3+ VIEW COMPARISON:  10/22/2022 FINDINGS: Degenerative changes in the interphalangeal joints, first metatarsal-phalangeal joint, and intertarsal joints. Focal erosion again demonstrated in the first metatarsal head possibly indicating degenerative cyst and unchanged. Developing lucencies at the fourth metatarsal head could indicate osteomyelitis in the appropriate setting. No acute fracture or dislocation. Diffuse soft tissue swelling. No radiopaque soft tissue foreign bodies or soft tissue gas. Soft tissue calcifications in the ankle, likely phleboliths or dystrophic calcification. IMPRESSION: Degenerative changes in the left foot. No acute bony abnormalities. New bone erosions in the fourth metatarsal head are nonspecific and could represent osteomyelitis in the appropriate clinical setting. Electronically Signed   By: Burman Nieves M.D.   On: 05/19/2023 19:53   DG Tibia/Fibula Left  Result Date: 05/19/2023 CLINICAL DATA:  Infected leg wounds for 1 year. EXAM: LEFT TIBIA AND FIBULA - 2 VIEW COMPARISON:  02/17/2023 FINDINGS: Degenerative changes demonstrated in the left knee and left ankle  joints. Tibia and fibula appear intact. No evidence of acute fracture or dislocation. No bone sclerosis or cortical erosion to suggest osteomyelitis. There is extensive soft tissue calcification likely representing phleboliths or dystrophic calcification. No soft tissue gas or radiopaque foreign bodies are demonstrated. Similar appearance to previous study. IMPRESSION: 1. Degenerative changes in the knee and ankle. No acute bony abnormalities. 2. Extensive soft tissue calcification, likely phleboliths or dystrophic. No radiopaque foreign bodies or soft tissue gas. Electronically Signed   By: Burman Nieves M.D.   On: 05/19/2023 19:51   - pertinent xrays, CT, MRI studies were reviewed and independently interpreted  Positive ROS: All other systems have been reviewed and were otherwise negative with the exception of those mentioned in the HPI and as above.  Physical Exam: General: Alert, no acute distress Psychiatric: Patient is competent for consent with normal mood and affect Lymphatic: No axillary or cervical lymphadenopathy Cardiovascular: No pedal edema Respiratory: No cyanosis, no use of accessory musculature GI: No organomegaly, abdomen is soft and non-tender    Images:  @ENCIMAGES @  Labs:  Lab Results  Component Value Date   HGBA1C 8.1 (H) 10/23/2022   HGBA1C 9.5 (H) 06/11/2022   HGBA1C 11.5 (A) 05/21/2021   ESRSEDRATE 110 (H) 05/20/2023   ESRSEDRATE 90 (H) 10/23/2022   ESRSEDRATE 117 (H) 03/06/2021   CRP 4.7 (H) 05/20/2023   CRP 3.8 (H) 10/23/2022   CRP 43.7 (H) 03/06/2021   REPTSTATUS PENDING 05/19/2023   GRAMSTAIN  10/24/2022    ABUNDANT SQUAMOUS EPITHELIAL CELLS PRESENT FEW WBC PRESENT, PREDOMINANTLY PMN MODERATE GRAM POSITIVE RODS MODERATE GRAM POSITIVE COCCI FEW GRAM NEGATIVE RODS    CULT  05/19/2023    NO GROWTH < 24 HOURS Performed at [facility], [address], [place], [place] [address]    LABORGA PROTEUS MIRABILIS 10/24/2022   LABORGA  STAPHYLOCOCCUS AUREUS 10/24/2022   LABORGA PSEUDOMONAS AERUGINOSA 10/24/2022    Lab Results  Component Value Date   ALBUMIN 2.7 (L) 05/19/2023   ALBUMIN 2.7 (L) 02/19/2023   ALBUMIN 2.7 (L) 02/17/2023   PREALBUMIN 12.5 (L) 03/16/2017        Latest Ref Rng & Units 05/20/2023    6:35 AM 05/19/2023    9:10 PM 05/19/2023  12:28 PM  CBC EXTENDED  WBC 4.0 - 10.5 K/uL 7.4  7.2  7.3   RBC 3.87 - 5.11 MIL/uL 3.73  3.80  3.50   Hemoglobin 12.0 - 15.0 g/dL 26.7  12.4  58.0   HCT 36.0 - 46.0 % 33.3  33.8  32.0   Platelets 150 - 400 K/uL 418  396  467   NEUT# 1.7 - 7.7 K/uL   4.5   Lymph# 0.7 - 4.0 K/uL   2.1     Neurologic: Patient does not have protective sensation bilateral lower extremities.   MUSCULOSKELETAL:   Skin: Examination there is cellulitis induration and clear drainage from the left leg.  There is papillomata's changes to both lower extremities.  Patient has swelling of both feet however there is no ulcers around the great toes there is no swelling no signs of any acute gouty changes.  Review of the radiographs of both feet shows periarticular cystic changes of the MTP joint bilaterally consistent with chronic gout.  Patient's periarticular destructive bony changes are also seen in radiographs in May.  Patient also has calcification of the arteries with peripheral arterial disease down into the foot.  No recent uric acid levels on file.  Assessment: Assessment: Venous and lymphatic insufficiency both legs with chronic gout.  Plan: I will order a uric acid and place an order for colchicine.  Patient should be discharged on the colchicine as well.  I will place an order for knee-high compression socks that patient should wear around-the-clock.  I will follow-up in the office after discharge.  Thank you for the consult and the opportunity to see Ms. Synetta Shadow, MD [facility] [phone] 12:31 PM

## 2023-05-20 NOTE — Evaluation (Signed)
Physical Therapy Evaluation Patient Details Name: [name] MRN: [id number] DOB: 1950-09-14 Today's Date: 05/20/2023  History of Present Illness  72 y.o. female presents to [facility] 05/19/23 w/ worsening LE wounds. Admitted w/ B LE cellulitis, new bone erosions in 4th metatarsal head of L foot. Prior admit 10/23/22 for I&D BLE venous and lymphatic insufficiency ulcers. PMH: HTN, OA, gout, HLD, TIA, DM2, chronic LBP, PAD   Clinical Impression  Pt in recliner upon arrival and agreeable to PT eval. Prior to admission, pt would stand/pivot to power WC with physical assistance. Pt presents to therapy session close to mobility baseline with needing MinA to stand-pivot. Pt has decreased LE strength, balance, and mobility. Pt would benefit from acute skilled PT to address functional impairments. Recommending post-acute HHPT as pt wants to work on gaining independence with mobility. Acute PT to follow.          If plan is discharge home, recommend the following: A little help with walking and/or transfers;Assist for transportation;Help with stairs or ramp for entrance;A little help with bathing/dressing/bathroom;Assistance with cooking/housework   Can travel by private vehicle    No    Equipment Recommendations None recommended by PT     Functional Status Assessment Patient has had a recent decline in their functional status and demonstrates the ability to make significant improvements in function in a reasonable and predictable amount of time.     Precautions / Restrictions Precautions Precautions: Fall Restrictions Weight Bearing Restrictions: No      Mobility  Bed Mobility Overal bed mobility: Needs Assistance Bed Mobility: Sit to Supine       Sit to supine: Supervision, HOB elevated   General bed mobility comments: HOB elevated, pt uses hands to help guide one leg up at a time    Transfers Overall transfer level: Needs assistance Equipment used: None Transfers: Sit to/from  Stand, Bed to chair/wheelchair/BSC Sit to Stand: Min assist Stand pivot transfers: Min assist      General transfer comment: MinA for boost up and initial rise. Pt prefers to not use RW and reaches for next surface for support    Ambulation/Gait    General Gait Details: does not take steps at baseline        Balance Overall balance assessment: Needs assistance Sitting-balance support: No upper extremity supported, Feet supported Sitting balance-Leahy Scale: Good     Standing balance support: During functional activity, Single extremity supported Standing balance-Leahy Scale: Poor Standing balance comment: relies on external support        Pertinent Vitals/Pain Pain Assessment Pain Assessment: Faces Faces Pain Scale: Hurts little more Pain Location: buttocks Pain Descriptors / Indicators: Aching, Discomfort Pain Intervention(s): Limited activity within patient's tolerance, Monitored during session, Repositioned    Home Living Family/patient expects to be discharged to:: Private residence Living Arrangements: Spouse/significant other Available Help at Discharge: Available 24 hours/day;Family Type of Home: Apartment Home Access: Ramped entrance       Home Layout: One level Home Equipment: Wheelchair - power;Wheelchair - manual;Transport chair;Hospital bed;Grab bars - toilet;Grab bars - tub/shower;Hand held Armed forces logistics/support/administrative officer (2 wheels)      Prior Function Prior Level of Function : Needs assist       Physical Assist : Mobility (physical);ADLs (physical) Mobility (physical): Transfers ADLs (physical): IADLs;Dressing Mobility Comments: Transfer to power chair via stand pivots ADLs Comments: occasional assist from husband with LBD, sponge bath     Extremity/Trunk Assessment   Upper Extremity Assessment Upper Extremity Assessment: Defer to OT evaluation  Lower Extremity Assessment Lower Extremity Assessment: Generalized weakness     Cervical / Trunk Assessment Cervical / Trunk Assessment: Normal  Communication   Communication Communication: No apparent difficulties Cueing Techniques: Verbal cues  Cognition Arousal: Alert Behavior During Therapy: WFL for tasks assessed/performed Overall Cognitive Status: Within Functional Limits for tasks assessed       General Comments General comments (skin integrity, edema, etc.): B LE wounds     PT Assessment Patient needs continued PT services  PT Problem List Decreased strength;Decreased range of motion;Decreased activity tolerance;Decreased balance;Decreased mobility       PT Treatment Interventions DME instruction;Gait training;Functional mobility training;Therapeutic activities;Therapeutic exercise;Balance training;Neuromuscular re-education;Patient/family education    PT Goals (Current goals can be found in the Care Plan section)  Acute Rehab PT Goals Patient Stated Goal: to work on LE strength PT Goal Formulation: With patient Time For Goal Achievement: 06/03/23 Potential to Achieve Goals: Good    Frequency Min 1X/week        AM-PAC PT "6 Clicks" Mobility  Outcome Measure Help needed turning from your back to your side while in a flat bed without using bedrails?: A Little Help needed moving from lying on your back to sitting on the side of a flat bed without using bedrails?: A Little Help needed moving to and from a bed to a chair (including a wheelchair)?: A Little Help needed standing up from a chair using your arms (e.g., wheelchair or bedside chair)?: A Little Help needed to walk in hospital room?: Total Help needed climbing 3-5 steps with a railing? : Total 6 Click Score: 14    End of Session Equipment Utilized During Treatment: Gait belt Activity Tolerance: Patient tolerated treatment well Patient left: in bed;with call bell/phone within reach;with bed alarm set Nurse Communication: Mobility status PT Visit Diagnosis: Unsteadiness on feet  (R26.81);Muscle weakness (generalized) (M62.81)    Time: 1331-1350 PT Time Calculation (min) (ACUTE ONLY): 19 min   Charges:   PT Evaluation $PT Eval Low Complexity: 1 Low   PT General Charges $$ ACUTE PT VISIT: 1 Visit         Hilton Cork, PT, DPT Secure Chat Preferred  Rehab Office [phone]   Arturo Morton Brion Aliment 05/20/2023, 2:56 PM

## 2023-05-20 NOTE — Progress Notes (Signed)
   05/20/23 1648  Assess: MEWS Score  Temp (!) 101 F (38.3 C)  Pulse Rate (!) 107  Assess: MEWS Score  MEWS Temp 1  MEWS Systolic 0  MEWS Pulse 1  MEWS RR 0  MEWS LOC 0  MEWS Score 2  MEWS Score Color Yellow  Assess: if the MEWS score is Yellow or Red  Were vital signs accurate and taken at a resting state? Yes  Does the patient meet 2 or more of the SIRS criteria? No  MEWS guidelines implemented  Yes, yellow  Treat  MEWS Interventions Considered administering scheduled or prn medications/treatments as ordered  Take Vital Signs  Increase Vital Sign Frequency  Yellow: Q2hr x1, continue Q4hrs until patient remains green for 12hrs  Escalate  MEWS: Escalate Yellow: Discuss with charge nurse and consider notifying provider and/or RRT  Notify: Charge Nurse/RN  Name of Charge Nurse/RN Notified Miriam,RN  Provider Notification  Provider Name/Title Lama,MD  Date Provider Notified 05/20/23  Time Provider Notified 1650  Method of Notification Page  Notification Reason Other (Comment) (Yellow MEWS)  Provider response Other (Comment) (give tylenol)  Date of Provider Response 05/20/23  Time of Provider Response 1652  Assess: SIRS CRITERIA  SIRS Temperature  1  SIRS Pulse 1  SIRS Respirations  0  SIRS WBC 0  SIRS Score Sum  2

## 2023-05-20 NOTE — Progress Notes (Signed)
Orthopedic Tech Progress Note Patient Details:  [name] Sep 15, 1950 [id number]  2 pairs of large compression socks ordered from [facility],  Patient ID: [name], female   DOB: [birth year], 72 y.o.   MRN: [id number]  [name] 05/20/2023, 2:02 PM

## 2023-05-21 DIAGNOSIS — L03116 Cellulitis of left lower limb: Secondary | ICD-10-CM | POA: Diagnosis not present

## 2023-05-21 LAB — CBC
HCT: 31.1 % — ABNORMAL LOW (ref 36.0–46.0)
Hemoglobin: 10.1 g/dL — ABNORMAL LOW (ref 12.0–15.0)
MCH: 28.4 pg (ref 26.0–34.0)
MCHC: 32.5 g/dL (ref 30.0–36.0)
MCV: 87.4 fL (ref 80.0–100.0)
Platelets: 355 10*3/uL (ref 150–400)
RBC: 3.56 MIL/uL — ABNORMAL LOW (ref 3.87–5.11)
RDW: 14.4 % (ref 11.5–15.5)
WBC: 4.6 10*3/uL (ref 4.0–10.5)
nRBC: 0 % (ref 0.0–0.2)

## 2023-05-21 LAB — BLOOD CULTURE ID PANEL (REFLEXED) - BCID2

## 2023-05-21 LAB — BASIC METABOLIC PANEL
Anion gap: 6 (ref 5–15)
BUN: 5 mg/dL — ABNORMAL LOW (ref 8–23)
CO2: 24 mmol/L (ref 22–32)
Calcium: 8.4 mg/dL — ABNORMAL LOW (ref 8.9–10.3)
Chloride: 103 mmol/L (ref 98–111)
Creatinine, Ser: 0.85 mg/dL (ref 0.44–1.00)
GFR, Estimated: >60 mL/min (ref >60)
Glucose, Bld: 95 mg/dL (ref 70–99)
Potassium: 3.4 mmol/L — ABNORMAL LOW (ref 3.5–5.1)
Sodium: 133 mmol/L — ABNORMAL LOW (ref 135–145)

## 2023-05-21 MED ORDER — SODIUM CHLORIDE 0.9 % IV SOLN
2.0000 g | INTRAVENOUS | Status: DC
  Administered 2023-05-21 – 2023-05-23 (×3): 2 g via INTRAVENOUS
  Filled 2023-05-21 (×3): qty 20

## 2023-05-21 MED ORDER — POTASSIUM CHLORIDE 20 MEQ PO PACK
40.0000 meq | PACK | Freq: Once | ORAL | Status: AC
  Administered 2023-05-21: 40 meq via ORAL
  Filled 2023-05-21: qty 2

## 2023-05-21 NOTE — Plan of Care (Signed)

## 2023-05-21 NOTE — Plan of Care (Signed)
  Problem: Education: Goal: Knowledge of General Education information will improve Description: Including pain rating scale, medication(s)/side effects and non-pharmacologic comfort measures Outcome: Progressing   Problem: Clinical Measurements: Goal: Ability to maintain clinical measurements within normal limits will improve Outcome: Progressing   Problem: Coping: Goal: Level of anxiety will decrease Outcome: Progressing   

## 2023-05-21 NOTE — Progress Notes (Signed)
Physical Therapy Treatment Patient Details Name: [name] MRN: [id number] DOB: 12/04/1950 Today's Date: 05/21/2023   History of Present Illness 72 y.o. female presents to [facility] 05/19/23 w/ worsening LE wounds. Admitted w/ B LE cellulitis, new bone erosions in 4th metatarsal head of L foot. Prior admit 10/23/22 for I&D BLE venous and lymphatic insufficiency ulcers. PMH: HTN, OA, gout, HLD, TIA, DM2, chronic LBP, PAD   PT Comments  Pt in bed upon arrival and agreeable to PT session. Pt did not want to transfer today and preferred to work on LE strength in today's session. Pt continues to have L>R LE weakness when performing exercises. Pt also needs at least 1UE support when performing exercises at EOB due to decreased dynamic stability in sitting. Pt needed assistance for pericare and performed rolls in supine to replace bed pads. Pt is progressing towards goals. Acute PT to follow.     If plan is discharge home, recommend the following: A little help with walking and/or transfers;Assist for transportation;Help with stairs or ramp for entrance;A little help with bathing/dressing/bathroom;Assistance with cooking/housework   Can travel by private vehicle      No  Equipment Recommendations  None recommended by PT       Precautions / Restrictions Precautions Precautions: Fall Restrictions Weight Bearing Restrictions: No     Mobility  Bed Mobility Overal bed mobility: Needs Assistance Bed Mobility: Supine to Sit, Sit to Supine, Rolling Rolling: Supervision, Used rails   Supine to sit: Min assist, HOB elevated, Used rails Sit to supine: Supervision, HOB elevated   General bed mobility comments: MinA to raise trunk    Transfers    General transfer comment: pt declined transfer        Balance Overall balance assessment: Needs assistance Sitting-balance support: Feet supported, Single extremity supported Sitting balance-Leahy Scale: Fair Sitting balance - Comments: needs 1UE  support dynamically     Cognition Arousal: Alert Behavior During Therapy: WFL for tasks assessed/performed Overall Cognitive Status: Within Functional Limits for tasks assessed         Exercises General Exercises - Lower Extremity Long Arc Quad: AROM, Both, 10 reps, Seated Hip ABduction/ADduction: AROM, Both, 10 reps, Supine (in SLR to avoid shearing LE) Straight Leg Raises: AROM, Both, 10 reps, Supine Hip Flexion/Marching: AROM, Both, 10 reps, Seated    General Comments General comments (skin integrity, edema, etc.): BLE wounds, VSS on RA      Pertinent Vitals/Pain Pain Assessment Pain Assessment: No/denies pain     PT Goals (current goals can now be found in the care plan section) Acute Rehab PT Goals PT Goal Formulation: With patient Time For Goal Achievement: 06/03/23 Potential to Achieve Goals: Good Progress towards PT goals: Progressing toward goals    Frequency    Min 1X/week       AM-PAC PT "6 Clicks" Mobility   Outcome Measure  Help needed turning from your back to your side while in a flat bed without using bedrails?: A Little Help needed moving from lying on your back to sitting on the side of a flat bed without using bedrails?: A Little Help needed moving to and from a bed to a chair (including a wheelchair)?: A Little Help needed standing up from a chair using your arms (e.g., wheelchair or bedside chair)?: A Little Help needed to walk in hospital room?: Total Help needed climbing 3-5 steps with a railing? : Total 6 Click Score: 14    End of Session   Activity Tolerance: Patient  tolerated treatment well Patient left: in bed;with call bell/phone within reach;with bed alarm set Nurse Communication: Mobility status PT Visit Diagnosis: Unsteadiness on feet (R26.81);Muscle weakness (generalized) (M62.81)     Time: 6045-4098 PT Time Calculation (min) (ACUTE ONLY): 28 min  Charges:    $Therapeutic Exercise: 8-22 mins $Therapeutic Activity: 8-22  mins PT General Charges $$ ACUTE PT VISIT: 1 Visit                     Hilton Cork, PT, DPT Secure Chat Preferred  Rehab Office [phone]   Arturo Morton Brion Aliment 05/21/2023, 12:10 PM

## 2023-05-21 NOTE — TOC Initial Note (Signed)
Transition of Care (TOC) - Initial/Assessment Note   Patient from home with family. Husband helps with wound care.   PT recommending HHPT. Patient recently had [facility] and would like them back. Kandee Keen with [facility] accepted referral for HHRN,PT,OT and aide.   Patient has DME already including hospital bed with trapeze and electric wheelchair   Patient Details  Name: [name] MRN: [id number] Date of Birth: Dec 24, 1950  Transition of Care [facility]) CM/SW Contact:    Kingsley Plan, RN Phone Number: 05/21/2023, 10:22 AM  Clinical Narrative:                   Expected Discharge Plan: Home w Home Health Services Barriers to Discharge: Continued Medical Work up   Patient Goals and CMS Choice Patient states their goals for this hospitalization and ongoing recovery are:: to return to home CMS Medicare.gov Compare Post Acute Care list provided to:: Patient Choice offered to / list presented to : Patient      Expected Discharge Plan and Services   Discharge Planning Services: CM Consult Post Acute Care Choice: Home Health Living arrangements for the past 2 months: Single Family Home                 DME Arranged: N/A         HH Arranged: RN, PT, OT, Nurse's Aide HH Agency: [facility] Date [facility] Agency Contacted: 05/21/23 Time HH Agency Contacted: 1021 Representative spoke with at [facility] Agency: Kandee Keen  Prior Living Arrangements/Services Living arrangements for the past 2 months: Single Family Home Lives with:: Spouse Patient language and need for interpreter reviewed:: Yes Do you feel safe going back to the place where you live?: Yes      Need for Family Participation in Patient Care: Yes (Comment) Care giver support system in place?: Yes (comment) Current home services: DME Criminal Activity/Legal Involvement Pertinent to Current Situation/Hospitalization: No - Comment as needed  Activities of Daily Living   ADL Screening (condition at time of  admission) Independently performs ADLs?: Yes (appropriate for developmental age) Is the patient deaf or have difficulty hearing?: No Does the patient have difficulty seeing, even when wearing glasses/contacts?: Yes Does the patient have difficulty concentrating, remembering, or making decisions?: No  Permission Sought/Granted   Permission granted to share information with : Yes, Verbal Permission Granted     Permission granted to share info w AGENCY: [facility]        Emotional Assessment Appearance:: Appears stated age Attitude/Demeanor/Rapport: Engaged Affect (typically observed): Accepting Orientation: : Oriented to Self, Oriented to Place, Oriented to  Time, Oriented to Situation Alcohol / Substance Use: Not Applicable Psych Involvement: No (comment)  Admission diagnosis:  Cellulitis [L03.90] Cellulitis of left lower extremity [L03.116] Patient Active Problem List   Diagnosis Date Noted   Ulcer of extremity due to chronic venous insufficiency (HCC) 02/19/2023   Lymphedema 02/19/2023   SBO (small bowel obstruction) (HCC) 02/18/2023   GAD (generalized anxiety disorder) 02/18/2023   Asthma, chronic 02/18/2023   Diabetic ulcer of left lower leg (HCC) 10/24/2022   Chronic venous hypertension (idiopathic) with ulcer and inflammation of bilateral lower extremity (HCC) 10/24/2022   Non-healing wound of lower extremity 10/23/2022   Bacteria in urine 06/26/2022   Stasis ulcer of lower extremity (HCC) 06/26/2022   Elevated troponin 06/25/2022   Abnormal urinalysis 06/25/2022   History of CVA (cerebrovascular accident) 06/25/2022   Memory loss 06/25/2022   Cryptogenic stroke (HCC) 06/11/2022   Middle cerebral artery embolism,  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   Wound infection 02/05/2021   Venous stasis dermatitis of both lower extremities    Pneumonia due to COVID-19 virus 06/29/2019   Hypophosphatemia 06/24/2019   Obesity 06/23/2019   Insulin  dependent type 2 diabetes mellitus (HCC) 06/22/2019   Anxiety 06/22/2019   Essential hypertension 06/22/2019   Hyperlipidemia 06/22/2019   COVID-19 06/21/2019   Severe protein-calorie malnutrition (HCC) 06/21/2019   Acute respiratory failure with hypoxia (HCC) 06/21/2019   Diabetic foot ulcer associated with type 2 diabetes mellitus (HCC)    Cellulitis 07/05/2016   Depression 05/30/2016   Lower extremity ulceration (HCC) 05/30/2016   Generalized weakness 05/30/2016   Left arm weakness 05/30/2016   Left Lower extremity pain  04/02/2016   Lower extremity pain, inferior, left 04/02/2016   Left leg pain    Personal history of noncompliance with medical treatment, presenting hazards to health 03/31/2016   Jaw pain 03/27/2016   Left leg cellulitis 03/24/2016   Cellulitis of left lower extremity    Primary insomnia    Adjustment disorder    Morbid obesity (HCC) 01/27/2016   Wheelchair dependent 01/27/2016   Gout 01/27/2016   Arthritis 01/27/2016   Uncontrolled type 2 diabetes mellitus with hypoglycemia, with long-term current use of insulin (HCC) 01/25/2016   Cellulitis of left leg 01/11/2016   Insulin-requiring or dependent type II diabetes mellitus (HCC) 01/11/2016   PCP:  Hillery Aldo, NP Pharmacy:   [facility] [address] - [place], [place] [place] - [address] [place] [address] Phone: [phone] Fax: [phone]  [facility] #[id number] [place], [place] - [address] AT [address] [place] [address] Phone: [phone] Fax: [phone]  [facility] [address] [place] [address] Phone: [phone] Fax: [phone]  [facility] #[id number] [place], [place] - [address] AT [facility] [address] [place] [address] Phone: [phone] Fax: [phone]     Social Determinants of Health (SDOH) Social History: SDOH  Screenings   Food Insecurity: No Food Insecurity (05/19/2023)  Housing: Low Risk  (05/19/2023)  Transportation Needs: No Transportation Needs (05/19/2023)  Utilities: Not At Risk (05/19/2023)  Tobacco Use: Low Risk  (05/20/2023)   SDOH Interventions:     Readmission Risk Interventions     No data to display

## 2023-05-21 NOTE — Progress Notes (Signed)
[facility]  PROGRESS NOTE  [name] OZH:[id number] DOB: 07-14-1950 DOA: 05/19/2023 PCP: Hillery Aldo, NP   Brief HPI:   72 y.o. female with medical history significant for chronic bilateral lower extremity wounds, type 2 diabetes, hyperlipidemia, hypertension, obesity, who presented to the ED, sent from her PCPs office due to worsening leg wounds..  Associated with subjective intermittent fevers.     Assessment/Plan:    Bilateral lower extremity cellulitis, POA Follows with Dr. Lajoyce Corners outpatient Continue IV antibiotics empirically, IV vancomycin and cefepime. -Cefepime changed to ceftriaxone Follow cultures for ID and sensitivities  Bacteremia -Blood culture growing gram-positive cocci in chains -Continue vancomycin and ceftriaxone -Follow blood culture results    New bone erosions in the fourth metatarsal head are nonspecific and could represent osteomyelitis in the appropriate clinical setting. -Orthopedics consulted -Daughter noticed does not think that this is osteomyelitis, he feels this is gout -Colchicine ordered -Also knee-high compression socks ordered per Dr. Lajoyce Corners   Generalized weakness PT OT assessment Fall precautions.   Obesity BMI 37 Recommend weight loss outpatient with regular physical activity and healthy dieting.   Medications     atorvastatin  20 mg Oral Daily   colchicine  0.6 mg Oral Daily   enoxaparin (LOVENOX) injection  50 mg Subcutaneous Q24H     Data Reviewed:   CBG:  Recent Labs  Lab 05/19/23 1231  GLUCAP 97    SpO2: 100 %    Vitals:   05/20/23 1827 05/20/23 1944 05/21/23 0400 05/21/23 0744  BP: (!) 125/56 110/61 98/65 126/68  Pulse: (!) 117 (!) 110 98 85  Resp: 18 20  16   Temp: (!) 101.1 F (38.4 C) 99.6 F (37.6 C) 98.5 F (36.9 C) 98.9 F (37.2 C)  TempSrc: Oral Oral Oral Oral  SpO2: 96% 97%  100%  Weight:      Height:          Data Reviewed:  Basic Metabolic Panel: Recent Labs  Lab  05/19/23 1228 05/19/23 2110 05/20/23 0635 05/21/23 0540  NA 138  --  139 133*  K 4.2  --  3.8 3.4*  CL 105  --  111 103  CO2 22  --  23 24  GLUCOSE 102*  --  103* 95  BUN 5*  --  <5* 5*  CREATININE 0.76 0.78 0.83 0.85  CALCIUM 9.3  --  8.8* 8.4*  MG  --   --  2.1  --   PHOS  --   --  3.3  --     CBC: Recent Labs  Lab 05/19/23 1228 05/19/23 2110 05/20/23 0635 05/21/23 0540  WBC 7.3 7.2 7.4 4.6  NEUTROABS 4.5  --   --   --   HGB 10.0* 10.6* 10.4* 10.1*  HCT 32.0* 33.8* 33.3* 31.1*  MCV 91.4 88.9 89.3 87.4  PLT 467* 396 418* 355    LFT Recent Labs  Lab 05/19/23 1228  AST 15  ALT 8  ALKPHOS 81  BILITOT 0.7  PROT 8.8*  ALBUMIN 2.7*     Antibiotics: Anti-infectives (From admission, onward)    Start     Dose/Rate Route Frequency Ordered Stop   05/21/23 1400  cefTRIAXone (ROCEPHIN) 2 g in sodium chloride 0.9 % 100 mL IVPB        2 g 200 mL/hr over 30 Minutes Intravenous Every 24 hours 05/21/23 0822     05/21/23 0600  vancomycin (VANCOREADY) IVPB 1250 mg/250 mL        1,250  mg 166.7 mL/hr over 90 Minutes Intravenous Every 24 hours 05/20/23 1009     05/20/23 1000  vancomycin (VANCOCIN) IVPB 1000 mg/200 mL premix  Status:  Discontinued        1,000 mg 200 mL/hr over 60 Minutes Intravenous Every 12 hours 05/19/23 2147 05/20/23 1009   05/20/23 0500  ceFEPIme (MAXIPIME) 2 g in sodium chloride 0.9 % 100 mL IVPB  Status:  Discontinued        2 g 200 mL/hr over 30 Minutes Intravenous Every 8 hours 05/19/23 2110 05/21/23 0822   05/19/23 2230  Vancomycin (VANCOCIN) 1,500 mg in sodium chloride 0.9 % 500 mL IVPB       Placed in "Followed by" Linked Group   1,500 mg 250 mL/hr over 120 Minutes Intravenous  Once 05/19/23 2103 05/20/23 1138   05/19/23 2115  vancomycin (VANCOCIN) IVPB 1000 mg/200 mL premix       Placed in "Followed by" Linked Group   1,000 mg 200 mL/hr over 60 Minutes Intravenous  Once 05/19/23 2103 05/19/23 2326   05/19/23 2100  ceFEPIme (MAXIPIME) 2 g in  sodium chloride 0.9 % 100 mL IVPB        2 g 200 mL/hr over 30 Minutes Intravenous  Once 05/19/23 2103 05/19/23 2222        DVT prophylaxis: Lovenox  Code Status: Full code  Family Communication: Discussed with family member at bedside   CONSULTS orthopedics   Subjective   Denies pain   Objective    Physical Examination:   General-appears in no acute distress Heart-S1-S2, regular, no murmur auscultated Lungs-clear to auscultation bilaterally, no wheezing or crackles auscultated Abdomen-soft, nontender, no organomegaly Extremities-multiple bilateral lesions in lower extremities Neuro-alert, oriented x3, no focal deficit noted   Status is: Inpatient:             [name]   [facility] If 7PM-7AM, please contact night-coverage at [email or web address], Office  [phone]   05/21/2023, 9:39 AM  LOS: 2 days

## 2023-05-21 NOTE — Progress Notes (Signed)
PHARMACY - PHYSICIAN COMMUNICATION CRITICAL VALUE ALERT - BLOOD CULTURE IDENTIFICATION (BCID)  [name] is an 72 y.o. female who presented to [facility] on 05/19/2023 with a chief complaint of worsening leg wounds and subjective fever  Assessment:  72 YOF with leg wounds and initial concern for L-foot osteo. Ortho consulted and thinks related to gout. Now with 1 of 4 blood cultures growing GPC in chains with BCID not detecting anything. Could be a skin pathogen such as micrococcus or an alternative enterococcus species. Will have to wait on further formal identification by the micro lab.  Name of physician (or Provider) Contacted: Lama  Current antibiotics: Vancomycin + Cefepime  Changes to prescribed antibiotics recommended:  Given the patient's clinical picture - would favor leaving Vancomycin on until formal identification made by micro De-escalate Cefepime to Rocephin - discussed with Dr. Sharl Ma  Results for orders placed or performed during the hospital encounter of 05/19/23  Blood Culture ID Panel (Reflexed) (Collected: 05/19/2023 12:12 PM)  Result Value Ref Range   Enterococcus faecalis NOT DETECTED NOT DETECTED   Enterococcus Faecium NOT DETECTED NOT DETECTED   Listeria monocytogenes NOT DETECTED NOT DETECTED   Staphylococcus species NOT DETECTED NOT DETECTED   Staphylococcus aureus (BCID) NOT DETECTED NOT DETECTED   Staphylococcus epidermidis NOT DETECTED NOT DETECTED   Staphylococcus lugdunensis NOT DETECTED NOT DETECTED   Streptococcus species NOT DETECTED NOT DETECTED   Streptococcus agalactiae NOT DETECTED NOT DETECTED   Streptococcus pneumoniae NOT DETECTED NOT DETECTED   Streptococcus pyogenes NOT DETECTED NOT DETECTED   A.calcoaceticus-baumannii NOT DETECTED NOT DETECTED   Bacteroides fragilis NOT DETECTED NOT DETECTED   Enterobacterales NOT DETECTED NOT DETECTED   Enterobacter cloacae complex NOT DETECTED NOT DETECTED   Escherichia coli NOT DETECTED NOT DETECTED    Klebsiella aerogenes NOT DETECTED NOT DETECTED   Klebsiella oxytoca NOT DETECTED NOT DETECTED   Klebsiella pneumoniae NOT DETECTED NOT DETECTED   Proteus species NOT DETECTED NOT DETECTED   Salmonella species NOT DETECTED NOT DETECTED   Serratia marcescens NOT DETECTED NOT DETECTED   Haemophilus influenzae NOT DETECTED NOT DETECTED   Neisseria meningitidis NOT DETECTED NOT DETECTED   Pseudomonas aeruginosa NOT DETECTED NOT DETECTED   Stenotrophomonas maltophilia NOT DETECTED NOT DETECTED   Candida albicans NOT DETECTED NOT DETECTED   Candida auris NOT DETECTED NOT DETECTED   Candida glabrata NOT DETECTED NOT DETECTED   Candida krusei NOT DETECTED NOT DETECTED   Candida parapsilosis NOT DETECTED NOT DETECTED   Candida tropicalis NOT DETECTED NOT DETECTED   Cryptococcus neoformans/gattii NOT DETECTED NOT DETECTED    Thank you for allowing pharmacy to be a part of this patient's care.  Georgina Pillion, PharmD, BCPS, BCIDP Infectious Diseases Clinical Pharmacist 05/21/2023 8:22 AM   **Pharmacist phone directory can now be found on [email or web address] ([email or web address]).  Listed under [facility].

## 2023-05-21 NOTE — Progress Notes (Signed)
   05/21/23 1510  Mobility  Activity Transferred from bed to chair  Level of Assistance Standby assist, set-up cues, supervision of patient - no hands on  Assistive Device Other (Comment) (bedrails, armrests)  Activity Response Tolerated fair  Mobility Referral Yes  Mobility visit 1 Mobility  Mobility Specialist Start Time (ACUTE ONLY) 1458  Mobility Specialist Stop Time (ACUTE ONLY) 1510  Mobility Specialist Time Calculation (min) (ACUTE ONLY) 12 min   Mobility Specialist: Progress Note  Pt agreeable to mobility session - received in bed. Required SB using bedrails/armrests. C/o tingling "pins and needles" in BLE. Returned to chair with all needs met - call bell within reach.   Barnie Mort, BS Mobility Specialist Please contact via SecureChat or Rehab office at [phone].

## 2023-05-22 DIAGNOSIS — L03116 Cellulitis of left lower limb: Secondary | ICD-10-CM | POA: Diagnosis not present

## 2023-05-22 LAB — CBC
HCT: 29.8 % — ABNORMAL LOW (ref 36.0–46.0)
Hemoglobin: 9.5 g/dL — ABNORMAL LOW (ref 12.0–15.0)
MCH: 27.5 pg (ref 26.0–34.0)
MCHC: 31.9 g/dL (ref 30.0–36.0)
MCV: 86.4 fL (ref 80.0–100.0)
Platelets: 316 10*3/uL (ref 150–400)
RBC: 3.45 MIL/uL — ABNORMAL LOW (ref 3.87–5.11)
RDW: 14.3 % (ref 11.5–15.5)
WBC: 4.4 10*3/uL (ref 4.0–10.5)
nRBC: 0 % (ref 0.0–0.2)

## 2023-05-22 LAB — BASIC METABOLIC PANEL
Anion gap: 7 (ref 5–15)
BUN: 5 mg/dL — ABNORMAL LOW (ref 8–23)
CO2: 22 mmol/L (ref 22–32)
Calcium: 8.5 mg/dL — ABNORMAL LOW (ref 8.9–10.3)
Chloride: 107 mmol/L (ref 98–111)
Creatinine, Ser: 0.9 mg/dL (ref 0.44–1.00)
GFR, Estimated: >60 mL/min (ref >60)
Glucose, Bld: 82 mg/dL (ref 70–99)
Potassium: 3.6 mmol/L (ref 3.5–5.1)
Sodium: 136 mmol/L (ref 135–145)

## 2023-05-22 MED ORDER — MIRTAZAPINE 15 MG PO TABS
7.5000 mg | ORAL_TABLET | Freq: Every day | ORAL | Status: DC
  Administered 2023-05-22 – 2023-05-23 (×2): 7.5 mg via ORAL
  Filled 2023-05-22 (×2): qty 1

## 2023-05-22 MED ORDER — ATENOLOL 25 MG PO TABS
25.0000 mg | ORAL_TABLET | Freq: Every day | ORAL | Status: DC
  Administered 2023-05-22 – 2023-05-24 (×2): 25 mg via ORAL
  Filled 2023-05-22 (×2): qty 1

## 2023-05-22 MED ORDER — GABAPENTIN 300 MG PO CAPS
300.0000 mg | ORAL_CAPSULE | Freq: Three times a day (TID) | ORAL | Status: DC
  Administered 2023-05-22 – 2023-05-24 (×7): 300 mg via ORAL
  Filled 2023-05-22 (×7): qty 1

## 2023-05-22 MED ORDER — DONEPEZIL HCL 10 MG PO TABS
10.0000 mg | ORAL_TABLET | Freq: Every day | ORAL | Status: DC
  Administered 2023-05-22 – 2023-05-23 (×2): 10 mg via ORAL
  Filled 2023-05-22 (×2): qty 1

## 2023-05-22 MED ORDER — LURASIDONE HCL 40 MG PO TABS
40.0000 mg | ORAL_TABLET | Freq: Every day | ORAL | Status: DC
  Administered 2023-05-22 – 2023-05-24 (×3): 40 mg via ORAL
  Filled 2023-05-22 (×4): qty 1

## 2023-05-22 MED ORDER — ALLOPURINOL 100 MG PO TABS
100.0000 mg | ORAL_TABLET | Freq: Every day | ORAL | Status: DC
  Administered 2023-05-22 – 2023-05-24 (×3): 100 mg via ORAL
  Filled 2023-05-22 (×3): qty 1

## 2023-05-22 NOTE — Progress Notes (Signed)
Mobility Specialist Progress Note:   05/22/23 1110  Mobility  Activity Transferred from bed to chair  Level of Assistance Minimal assist, patient does 75% or more  Assistive Device Other (Comment) (HHA)  Distance Ambulated (ft) 3 ft  Activity Response Tolerated well  Mobility Referral Yes  Mobility visit 1 Mobility  Mobility Specialist Start Time (ACUTE ONLY) 1110  Mobility Specialist Stop Time (ACUTE ONLY) 1123  Mobility Specialist Time Calculation (min) (ACUTE ONLY) 13 min   Pt agreeable to mobility session. Required light minA via HHA to transfer to chair. No c/o throughout. Pt left with all needs met, eager for d/c.  Addison Lank Mobility Specialist Please contact via SecureChat or  Rehab office at [phone]

## 2023-05-22 NOTE — Progress Notes (Signed)
[facility]  PROGRESS NOTE  [name] ZOX:[id number] DOB: 03-02-1951 DOA: 05/19/2023 PCP: Hillery Aldo, NP   Brief HPI:   72 y.o. female with medical history significant for chronic bilateral lower extremity wounds, type 2 diabetes, hyperlipidemia, hypertension, obesity, who presented to the ED, sent from her PCPs office due to worsening leg wounds..  Associated with subjective intermittent fevers.     Assessment/Plan:    Bilateral lower extremity cellulitis, POA Follows with Dr. Lajoyce Corners outpatient Continue IV antibiotics empirically, IV vancomycin and cefepime. -Cefepime changed to ceftriaxone Follow cultures for ID and sensitivities  Bacteremia -Blood culture growing gram-positive cocci in chains -Continue vancomycin and ceftriaxone -Follow blood culture results    New bone erosions in the fourth metatarsal head are nonspecific and could represent osteomyelitis in the appropriate clinical setting. -Orthopedics consulted -Daughter noticed does not think that this is osteomyelitis, he feels this is gout -Colchicine ordered -Also knee-high compression socks ordered per Dr. Lajoyce Corners   Generalized weakness PT OT assessment Fall precautions.   Obesity BMI 37 Recommend weight loss outpatient with regular physical activity and healthy dieting.   Medications     atorvastatin  20 mg Oral Daily   colchicine  0.6 mg Oral Daily   enoxaparin (LOVENOX) injection  50 mg Subcutaneous Q24H     Data Reviewed:   CBG:  Recent Labs  Lab 05/19/23 1231  GLUCAP 97    SpO2: 100 %    Vitals:   05/21/23 2032 05/21/23 2338 05/22/23 0404 05/22/23 0719  BP: (!) 146/72 (!) 153/77 121/69 (!) 140/70  Pulse: 81 80 79 74  Resp: 18 17 17 16   Temp: 98.3 F (36.8 C) 98 F (36.7 C) 98.3 F (36.8 C) 98.8 F (37.1 C)  TempSrc:    Oral  SpO2: 95% 98% 97% 100%  Weight:      Height:          Data Reviewed:  Basic Metabolic Panel: Recent Labs  Lab 05/19/23 1228  05/19/23 2110 05/20/23 0635 05/21/23 0540 05/22/23 0614  NA 138  --  139 133* 136  K 4.2  --  3.8 3.4* 3.6  CL 105  --  111 103 107  CO2 22  --  23 24 22   GLUCOSE 102*  --  103* 95 82  BUN 5*  --  <5* 5* 5*  CREATININE 0.76 0.78 0.83 0.85 0.90  CALCIUM 9.3  --  8.8* 8.4* 8.5*  MG  --   --  2.1  --   --   PHOS  --   --  3.3  --   --     CBC: Recent Labs  Lab 05/19/23 1228 05/19/23 2110 05/20/23 0635 05/21/23 0540 05/22/23 0614  WBC 7.3 7.2 7.4 4.6 4.4  NEUTROABS 4.5  --   --   --   --   HGB 10.0* 10.6* 10.4* 10.1* 9.5*  HCT 32.0* 33.8* 33.3* 31.1* 29.8*  MCV 91.4 88.9 89.3 87.4 86.4  PLT 467* 396 418* 355 316    LFT Recent Labs  Lab 05/19/23 1228  AST 15  ALT 8  ALKPHOS 81  BILITOT 0.7  PROT 8.8*  ALBUMIN 2.7*     Antibiotics: Anti-infectives (From admission, onward)    Start     Dose/Rate Route Frequency Ordered Stop   05/21/23 1400  cefTRIAXone (ROCEPHIN) 2 g in sodium chloride 0.9 % 100 mL IVPB        2 g 200 mL/hr over 30 Minutes Intravenous Every  24 hours 05/21/23 0822     05/21/23 0600  vancomycin (VANCOREADY) IVPB 1250 mg/250 mL        1,250 mg 166.7 mL/hr over 90 Minutes Intravenous Every 24 hours 05/20/23 1009     05/20/23 1000  vancomycin (VANCOCIN) IVPB 1000 mg/200 mL premix  Status:  Discontinued        1,000 mg 200 mL/hr over 60 Minutes Intravenous Every 12 hours 05/19/23 2147 05/20/23 1009   05/20/23 0500  ceFEPIme (MAXIPIME) 2 g in sodium chloride 0.9 % 100 mL IVPB  Status:  Discontinued        2 g 200 mL/hr over 30 Minutes Intravenous Every 8 hours 05/19/23 2110 05/21/23 0822   05/19/23 2230  Vancomycin (VANCOCIN) 1,500 mg in sodium chloride 0.9 % 500 mL IVPB       Placed in "Followed by" Linked Group   1,500 mg 250 mL/hr over 120 Minutes Intravenous  Once 05/19/23 2103 05/20/23 1138   05/19/23 2115  vancomycin (VANCOCIN) IVPB 1000 mg/200 mL premix       Placed in "Followed by" Linked Group   1,000 mg 200 mL/hr over 60 Minutes  Intravenous  Once 05/19/23 2103 05/19/23 2326   05/19/23 2100  ceFEPIme (MAXIPIME) 2 g in sodium chloride 0.9 % 100 mL IVPB        2 g 200 mL/hr over 30 Minutes Intravenous  Once 05/19/23 2103 05/19/23 2222        DVT prophylaxis: Lovenox  Code Status: Full code  Family Communication: Discussed with family member at bedside   CONSULTS orthopedics   Subjective    Denies any complaints.  Objective    Physical Examination:   General-appears in no acute distress Heart-S1-S2, regular, no murmur auscultated Lungs-clear to auscultation bilaterally, no wheezing or crackles auscultated Abdomen-soft, nontender, no organomegaly Extremities-lower extremities in dressing bilaterally Neuro-alert, oriented x3, no focal deficit noted   Status is: Inpatient:             [name]   [facility] If 7PM-7AM, please contact night-coverage at [email or web address], Office  [phone]   05/22/2023, 9:50 AM  LOS: 3 days

## 2023-05-22 NOTE — Plan of Care (Signed)

## 2023-05-22 NOTE — Plan of Care (Signed)
  Problem: Education: Goal: Knowledge of General Education information will improve Description: Including pain rating scale, medication(s)/side effects and non-pharmacologic comfort measures Outcome: Progressing   Problem: Clinical Measurements: Goal: Ability to maintain clinical measurements within normal limits will improve Outcome: Progressing   

## 2023-05-22 NOTE — Care Management Important Message (Signed)
Important Message  Patient Details  Name: [name] MRN: [id number] Date of Birth: [birth year]   Important Message Given:  Yes - Medicare IM     Sherilyn Banker 05/22/2023, 2:53 PM

## 2023-05-23 DIAGNOSIS — L03116 Cellulitis of left lower limb: Secondary | ICD-10-CM | POA: Diagnosis not present

## 2023-05-23 LAB — CBC
HCT: 32.6 % — ABNORMAL LOW (ref 36.0–46.0)
Hemoglobin: 10.3 g/dL — ABNORMAL LOW (ref 12.0–15.0)
MCH: 28 pg (ref 26.0–34.0)
MCHC: 31.6 g/dL (ref 30.0–36.0)
MCV: 88.6 fL (ref 80.0–100.0)
Platelets: 339 10*3/uL (ref 150–400)
RBC: 3.68 MIL/uL — ABNORMAL LOW (ref 3.87–5.11)
RDW: 14.3 % (ref 11.5–15.5)
WBC: 3.6 10*3/uL — ABNORMAL LOW (ref 4.0–10.5)
nRBC: 0 % (ref 0.0–0.2)

## 2023-05-23 LAB — BASIC METABOLIC PANEL
Anion gap: 6 (ref 5–15)
BUN: 5 mg/dL — ABNORMAL LOW (ref 8–23)
CO2: 22 mmol/L (ref 22–32)
Calcium: 8.7 mg/dL — ABNORMAL LOW (ref 8.9–10.3)
Chloride: 109 mmol/L (ref 98–111)
Creatinine, Ser: 0.7 mg/dL (ref 0.44–1.00)
GFR, Estimated: >60 mL/min (ref >60)
Glucose, Bld: 96 mg/dL (ref 70–99)
Potassium: 3.6 mmol/L (ref 3.5–5.1)
Sodium: 137 mmol/L (ref 135–145)

## 2023-05-23 NOTE — Progress Notes (Signed)
Mobility Specialist: Progress Note   05/23/23 1520  Mobility  Activity Transferred from bed to chair;Transferred to/from [facility]  Level of Assistance Standby assist, set-up cues, supervision of patient - no hands on  Assistive Device Other (Comment) (Bedrails/chair armrests)  Activity Response Tolerated well  Mobility Referral Yes  Mobility visit 1 Mobility  Mobility Specialist Start Time (ACUTE ONLY) 1427  Mobility Specialist Stop Time (ACUTE ONLY) 1447  Mobility Specialist Time Calculation (min) (ACUTE ONLY) 20 min    Pt was agreeable to mobility session - received in bed.No complaints. Close SV for bed mobility and transfer. Requested to use BSC. Completed void and BM; MS assisted with pericare. Left in chair with all needs met, call bell in reach.   Maurene Capes Mobility Specialist Please contact via SecureChat or Rehab office at [phone]

## 2023-05-23 NOTE — Progress Notes (Signed)
[facility]  PROGRESS NOTE  GARA WAHLS JJO:[id number] DOB: 11/10/1950 DOA: 05/19/2023 PCP: Hillery Aldo, NP   Brief HPI:   72 y.o. female with medical history significant for chronic bilateral lower extremity wounds, type 2 diabetes, hyperlipidemia, hypertension, obesity, who presented to the ED, sent from her PCPs office due to worsening leg wounds..  Associated with subjective intermittent fevers.     Assessment/Plan:    Bilateral lower extremity cellulitis, POA Follows with Dr. Lajoyce Corners outpatient Continue IV antibiotics empirically, IV vancomycin and cefepime. -Cefepime changed to ceftriaxone Follow cultures for ID and sensitivities  Bacteremia -Blood culture growing gram-positive cocci in chains -Continue vancomycin and ceftriaxone -Blood culture growing gram-positive cocci in chains, final result is pending    New bone erosions in the fourth metatarsal head are nonspecific and could represent osteomyelitis in the appropriate clinical setting. -Orthopedics consulted -Daughter noticed does not think that this is osteomyelitis, he feels this is gout -Colchicine ordered -Also knee-high compression socks ordered per Dr. Lajoyce Corners   Generalized weakness PT OT assessment -Home health PT recommended Fall precautions.   Obesity BMI 37 Recommend weight loss outpatient with regular physical activity and healthy dieting.   Medications     allopurinol  100 mg Oral Daily   atenolol  25 mg Oral Daily   atorvastatin  20 mg Oral Daily   colchicine  0.6 mg Oral Daily   donepezil  10 mg Oral QHS   enoxaparin (LOVENOX) injection  50 mg Subcutaneous Q24H   gabapentin  300 mg Oral TID   lurasidone  40 mg Oral Q breakfast   mirtazapine  7.5 mg Oral QHS     Data Reviewed:   CBG:  Recent Labs  Lab 05/19/23 1231  GLUCAP 97    SpO2: 97 %    Vitals:   05/22/23 1554 05/22/23 2215 05/23/23 0346 05/23/23 0813  BP: 121/63 122/64 121/64 (!) 151/77  Pulse: 63 69 (!) 57  (!) 57  Resp: 16 17 17 18   Temp: 98.2 F (36.8 C) 98 F (36.7 C) 97.7 F (36.5 C) 98.1 F (36.7 C)  TempSrc: Oral Oral Oral Oral  SpO2: 98% 97% 98% 97%  Weight:      Height:          Data Reviewed:  Basic Metabolic Panel: Recent Labs  Lab 05/19/23 1228 05/19/23 2110 05/20/23 0635 05/21/23 0540 05/22/23 0614 05/23/23 0433  NA 138  --  139 133* 136 137  K 4.2  --  3.8 3.4* 3.6 3.6  CL 105  --  111 103 107 109  CO2 22  --  23 24 22 22   GLUCOSE 102*  --  103* 95 82 96  BUN 5*  --  <5* 5* 5* 5*  CREATININE 0.76 0.78 0.83 0.85 0.90 0.70  CALCIUM 9.3  --  8.8* 8.4* 8.5* 8.7*  MG  --   --  2.1  --   --   --   PHOS  --   --  3.3  --   --   --     CBC: Recent Labs  Lab 05/19/23 1228 05/19/23 2110 05/20/23 0635 05/21/23 0540 05/22/23 0614 05/23/23 0433  WBC 7.3 7.2 7.4 4.6 4.4 3.6*  NEUTROABS 4.5  --   --   --   --   --   HGB 10.0* 10.6* 10.4* 10.1* 9.5* 10.3*  HCT 32.0* 33.8* 33.3* 31.1* 29.8* 32.6*  MCV 91.4 88.9 89.3 87.4 86.4 88.6  PLT 467* 396 418*  355 316 339    LFT Recent Labs  Lab 05/19/23 1228  AST 15  ALT 8  ALKPHOS 81  BILITOT 0.7  PROT 8.8*  ALBUMIN 2.7*     Antibiotics: Anti-infectives (From admission, onward)    Start     Dose/Rate Route Frequency Ordered Stop   05/21/23 1400  cefTRIAXone (ROCEPHIN) 2 g in sodium chloride 0.9 % 100 mL IVPB        2 g 200 mL/hr over 30 Minutes Intravenous Every 24 hours 05/21/23 0822     05/21/23 0600  vancomycin (VANCOREADY) IVPB 1250 mg/250 mL        1,250 mg 166.7 mL/hr over 90 Minutes Intravenous Every 24 hours 05/20/23 1009     05/20/23 1000  vancomycin (VANCOCIN) IVPB 1000 mg/200 mL premix  Status:  Discontinued        1,000 mg 200 mL/hr over 60 Minutes Intravenous Every 12 hours 05/19/23 2147 05/20/23 1009   05/20/23 0500  ceFEPIme (MAXIPIME) 2 g in sodium chloride 0.9 % 100 mL IVPB  Status:  Discontinued        2 g 200 mL/hr over 30 Minutes Intravenous Every 8 hours 05/19/23 2110 05/21/23  0822   05/19/23 2230  Vancomycin (VANCOCIN) 1,500 mg in sodium chloride 0.9 % 500 mL IVPB       Placed in "Followed by" Linked Group   1,500 mg 250 mL/hr over 120 Minutes Intravenous  Once 05/19/23 2103 05/20/23 1138   05/19/23 2115  vancomycin (VANCOCIN) IVPB 1000 mg/200 mL premix       Placed in "Followed by" Linked Group   1,000 mg 200 mL/hr over 60 Minutes Intravenous  Once 05/19/23 2103 05/19/23 2326   05/19/23 2100  ceFEPIme (MAXIPIME) 2 g in sodium chloride 0.9 % 100 mL IVPB        2 g 200 mL/hr over 30 Minutes Intravenous  Once 05/19/23 2103 05/19/23 2222        DVT prophylaxis: Lovenox  Code Status: Full code  Family Communication: Discussed with family member at bedside   CONSULTS orthopedics   Subjective    Denies pain, blood culture results still pending.  Objective    Physical Examination:  General-appears in no acute distress Heart-S1-S2, regular, no murmur auscultated Lungs-clear to auscultation bilaterally, no wheezing or crackles auscultated Abdomen-soft, nontender, no organomegaly Extremities-both lower extremities in dressing Neuro-alert, oriented x3, no focal deficit noted   Status is: Inpatient:             Donelle Baba S Olanrewaju Osborn   [facility] If 7PM-7AM, please contact night-coverage at [email or web address], Office  [phone]   05/23/2023, 9:48 AM  LOS: 4 days

## 2023-05-23 NOTE — Progress Notes (Addendum)
Pharmacy Antibiotic Note  [name] is a 72 y.o. female admitted on 05/19/2023 with B/L LE cellulitis. Pharmacy has been consulted for ceftriaxone and vancomycin dosing.  Blood culture with 1/2 sets positive for GPCs, not detected via BCID. Likely contaminant, but monitoring cultures for pathogenic bacteria.   SCr 0.7, afebrile, WBC 3.6 Weight 97.8  Plan: Continue vanc to 1250mg  IV Q24H for AUC 465 using SCr 0.7 Continue Ceftriaxone 2 g IV daily Monitor renal fxn, clinical progress, cultures, and de-escalation.  Height: 5\' 4"  (162.6 cm) Weight: 97.8 kg (215 lb 9.8 oz) IBW/kg (Calculated) : 54.7  Temp (24hrs), Avg:98 F (36.7 C), Min:97.7 F (36.5 C), Max:98.2 F (36.8 C)  Recent Labs  Lab 05/19/23 1237 05/19/23 1820 05/19/23 2110 05/20/23 0635 05/21/23 0540 05/22/23 0614 05/23/23 0433  WBC  --   --  7.2 7.4 4.6 4.4 3.6*  CREATININE  --   --  0.78 0.83 0.85 0.90 0.70  LATICACIDVEN 1.2 1.0  --   --   --   --   --     Estimated Creatinine Clearance: 70 mL/min (by C-G formula based on SCr of 0.7 mg/dL).    Allergies  Allergen Reactions   Ace Inhibitors Swelling   Latex Itching and Swelling   Ultram [Tramadol] Nausea And Vomiting   Zanaflex [Tizanidine] Other (See Comments)    Tremors    Diprivan [Propofol] Itching   Metformin And Related Other (See Comments)    Tremors  Chills    Cefepime 12/3 >> 12/05 Ceftriaxone 12/05 >> Vanc 12/3 >>    12/3 BCx - GPC in chains, not detected via BCID  Lora Paula, PharmD PGY-2 Infectious Diseases Pharmacy Resident [facility] 05/23/2023 9:41 AM

## 2023-05-24 DIAGNOSIS — L03116 Cellulitis of left lower limb: Secondary | ICD-10-CM | POA: Diagnosis not present

## 2023-05-24 LAB — CBC
HCT: 31.3 % — ABNORMAL LOW (ref 36.0–46.0)
Hemoglobin: 9.8 g/dL — ABNORMAL LOW (ref 12.0–15.0)
MCH: 28.1 pg (ref 26.0–34.0)
MCHC: 31.3 g/dL (ref 30.0–36.0)
MCV: 89.7 fL (ref 80.0–100.0)
Platelets: 331 10*3/uL (ref 150–400)
RBC: 3.49 MIL/uL — ABNORMAL LOW (ref 3.87–5.11)
RDW: 14.3 % (ref 11.5–15.5)
WBC: 5.1 10*3/uL (ref 4.0–10.5)
nRBC: 0 % (ref 0.0–0.2)

## 2023-05-24 LAB — BASIC METABOLIC PANEL
Anion gap: 5 (ref 5–15)
BUN: 6 mg/dL — ABNORMAL LOW (ref 8–23)
CO2: 22 mmol/L (ref 22–32)
Calcium: 8.8 mg/dL — ABNORMAL LOW (ref 8.9–10.3)
Chloride: 112 mmol/L — ABNORMAL HIGH (ref 98–111)
Creatinine, Ser: 0.78 mg/dL (ref 0.44–1.00)
GFR, Estimated: >60 mL/min (ref >60)
Glucose, Bld: 97 mg/dL (ref 70–99)
Potassium: 3.8 mmol/L (ref 3.5–5.1)
Sodium: 139 mmol/L (ref 135–145)

## 2023-05-24 LAB — CULTURE, BLOOD (ROUTINE X 2)
Culture: NO GROWTH
Special Requests: ADEQUATE

## 2023-05-24 MED ORDER — CEPHALEXIN 500 MG PO CAPS: 500.0000 mg | ORAL_CAPSULE | Freq: Four times a day (QID) | ORAL | 0 refills | Status: AC

## 2023-05-24 MED ORDER — COLCHICINE 0.6 MG PO TABS: 0.6000 mg | ORAL_TABLET | Freq: Every day | ORAL | 1 refills | Status: DC

## 2023-05-24 NOTE — Progress Notes (Signed)
Mobility Specialist: Progress Note   05/24/23 1634  Mobility  Activity Transferred from bed to chair  Level of Assistance Standby assist, set-up cues, supervision of patient - no hands on  Assistive Device Other (Comment) (bedrails, chair armrests)  Activity Response Tolerated well  Mobility Referral Yes  Mobility visit 1 Mobility  Mobility Specialist Start Time (ACUTE ONLY) 1222  Mobility Specialist Stop Time (ACUTE ONLY) 1241  Mobility Specialist Time Calculation (min) (ACUTE ONLY) 19 min    Pt was agreeable to mobility session - received in bed. No complaints. Had a BM in bed, MS and NT assisted with pericare and bed change. Left in chair with all needs met, call bell in reach.   Maurene Capes Mobility Specialist Please contact via SecureChat or Rehab office at [phone]

## 2023-05-24 NOTE — Discharge Summary (Signed)
Physician Discharge Summary   Patient: [name] MRN: [id number] DOB: 02/11/1951  Admit date:     05/19/2023  Discharge date: 05/24/23  Discharge Physician: [name]   PCP: Hillery Aldo, NP   Recommendations at discharge:  {Tip this will not be part of the note when signed- Example include specific recommendations for outpatient follow-up, pending tests to follow-up on. (Optional):26781} Follow-up Dr Lajoyce Corners  as outpatient  Discharge Diagnoses: Principal Problem:   Cellulitis  Resolved Problems:   * No resolved hospital problems. *  Hospital Course: 72 y.o. female with medical history significant for chronic bilateral lower extremity wounds, type 2 diabetes, hyperlipidemia, hypertension, obesity, who presented to the ED, sent from her PCPs office due to worsening leg wounds..  Associated with subjective intermittent fevers.   Assessment and Plan:  Bilateral lower extremity cellulitis, POA Follows with Dr. Lajoyce Corners outpatient -Started on empiric antibiotics Rocephin and vancomycin\-blood cultures grew cutibacterium acnes -Antibiotics changed to Keflex    Bacteremia -Blood culture growing gram-positive cocci in chains -Continue vancomycin and ceftriaxone -Blood culture growing gram-positive cocci in chains, resulted as Cutibacterium acnes -Discussed with ID, will discharge on Keflex 500 mg p.o. 4 times daily for 10 days     New bone erosions in the fourth metatarsal head are nonspecific and could represent osteomyelitis in the appropriate clinical setting. -Orthopedics consulted -Daughter noticed does not think that this is osteomyelitis, he feels this is gout -Colchicine ordered -Also knee-high compression socks ordered per Dr. Lajoyce Corners   Generalized weakness PT OT assessment -Home health PT recommended    Obesity BMI 37 Recommend weight loss outpatient with regular physical activity and healthy dieting.      {Tip this will not be part of the note when signed  Body mass index is 37.01 kg/m. , ,  (Optional):26781}  {(NOTE) Pain control PDMP Statment (Optional):26782} Consultants: *** Procedures performed: ***  Disposition: {Plan; Disposition:26390} Diet recommendation:  Discharge Diet Orders (From admission, onward)     Start     Ordered   05/24/23 0000  Diet - low sodium heart healthy        05/24/23 1431           {Diet_Plan:26776} DISCHARGE MEDICATION: Allergies as of 05/24/2023       Reactions   Ace Inhibitors Swelling   Latex Itching, Swelling   Ultram [tramadol] Nausea And Vomiting   Zanaflex [tizanidine] Other (See Comments)   Tremors    Diprivan [propofol] Itching   Metformin And Related Other (See Comments)   Tremors  Chills        Medication List     TAKE these medications    acetaminophen 325 MG tablet Commonly known as: TYLENOL Take 2 tablets (650 mg total) by mouth every 6 (six) hours as needed for mild pain (or Fever >/= 101).   allopurinol 100 MG tablet Commonly known as: ZYLOPRIM Take 100 mg by mouth daily.   aspirin EC 81 MG tablet Take 81 mg by mouth daily. Swallow whole.   atenolol 25 MG tablet Commonly known as: TENORMIN Take 25-50 mg by mouth See admin instructions. Take 50 mg (2 tablets) every morning and 25 mg (1 tablet) every evening.   atorvastatin 20 MG tablet Commonly known as: LIPITOR Take 20 mg by mouth at bedtime.   budesonide-formoterol 160-4.5 MCG/ACT inhaler Commonly known as: SYMBICORT Inhale 2 puffs into the lungs 2 (two) times daily as needed (wheezing, shortness of breath).   cephALEXin 500 MG capsule Commonly known as:  KEFLEX Take 1 capsule (500 mg total) by mouth 4 (four) times daily for 10 days.   colchicine 0.6 MG tablet Take 1 tablet (0.6 mg total) by mouth daily. Start taking on: May 25, 2023   cyclobenzaprine 5 MG tablet Commonly known as: FLEXERIL Take 5 mg by mouth 3 (three) times daily as needed for muscle spasms.   donepezil 10 MG  tablet Commonly known as: ARICEPT Take 10 mg by mouth at bedtime.   gabapentin 300 MG capsule Commonly known as: NEURONTIN Take 300 mg by mouth 3 (three) times daily.   lurasidone 40 MG Tabs tablet Commonly known as: Latuda Take 1 tablet (40 mg total) by mouth daily with breakfast.   mirtazapine 7.5 MG tablet Commonly known as: REMERON Take 7.5 mg by mouth at bedtime.   Multivitamin Women 50+ Tabs Take 1 tablet by mouth daily.   NIFEdipine 90 MG 24 hr tablet Commonly known as: PROCARDIA XL/NIFEDICAL-XL Take 90 mg by mouth daily.   polyethylene glycol powder 17 GM/SCOOP powder Commonly known as: GLYCOLAX/MIRALAX Take 17 g by mouth daily as needed for mild constipation.   Evaristo Bury FlexTouch 200 UNIT/ML FlexTouch Pen Generic drug: insulin degludec Inject 30 Units into the skin in the morning and at bedtime.               Discharge Care Instructions  (From admission, onward)           Start     Ordered   05/24/23 0000  Discharge wound care:       Comments: Apply Xeroform gauze to bilat legs Q day, then cover with ABD pads and kerlex, beginning just behind toes to below knees in a spiral fashion, then Ace wrap in the same manner.  05/20/23 1254   05/24/23 1431            Follow-up Information     Nadara Mustard, MD Follow up in 1 week(s).   Specialty: Orthopedic Surgery Contact information: [address] [place] [address] [phone]         Care, [facility] Follow up.   Specialty: Home Health Services Contact information: [address] [place] [address] [phone]                Discharge Exam: Ceasar Mons Weights   05/19/23 1153 05/19/23 2259  Weight: 127.5 kg 97.8 kg   ***  Condition at discharge: {DC Condition:26389}  The results of significant diagnostics from this hospitalization (including imaging, microbiology, ancillary and laboratory) are listed below for reference.   Imaging Studies: DG  Tibia/Fibula Right  Result Date: 05/19/2023 CLINICAL DATA:  Right lower extremity pain. Concern for infected wounds. EXAM: RIGHT TIBIA AND FIBULA - 2 VIEW COMPARISON:  Right lower extremity radiograph dated 02/17/2023. FINDINGS: There is no acute fracture or dislocation. The bones are osteopenic. Severe arthritic changes of the knee with severe joint space narrowing and spurring. There is diffuse subcutaneous edema and scattered subcutaneous calcification, likely sequela of chronic venous stasis. No radiopaque foreign object or soft tissue gas. IMPRESSION: 1. No acute fracture or dislocation. 2. Diffuse subcutaneous edema. Electronically Signed   By: Elgie Collard M.D.   On: 05/19/2023 19:55   DG Foot Complete Left  Result Date: 05/19/2023 CLINICAL DATA:  Infected leg wounds for 1 year. EXAM: LEFT FOOT - COMPLETE 3+ VIEW COMPARISON:  10/22/2022 FINDINGS: Degenerative changes in the interphalangeal joints, first metatarsal-phalangeal joint, and intertarsal joints. Focal erosion again demonstrated in the first metatarsal  head possibly indicating degenerative cyst and unchanged. Developing lucencies at the fourth metatarsal head could indicate osteomyelitis in the appropriate setting. No acute fracture or dislocation. Diffuse soft tissue swelling. No radiopaque soft tissue foreign bodies or soft tissue gas. Soft tissue calcifications in the ankle, likely phleboliths or dystrophic calcification. IMPRESSION: Degenerative changes in the left foot. No acute bony abnormalities. New bone erosions in the fourth metatarsal head are nonspecific and could represent osteomyelitis in the appropriate clinical setting. Electronically Signed   By: Burman Nieves M.D.   On: 05/19/2023 19:53   DG Tibia/Fibula Left  Result Date: 05/19/2023 CLINICAL DATA:  Infected leg wounds for 1 year. EXAM: LEFT TIBIA AND FIBULA - 2 VIEW COMPARISON:  02/17/2023 FINDINGS: Degenerative changes demonstrated in the left knee and left ankle  joints. Tibia and fibula appear intact. No evidence of acute fracture or dislocation. No bone sclerosis or cortical erosion to suggest osteomyelitis. There is extensive soft tissue calcification likely representing phleboliths or dystrophic calcification. No soft tissue gas or radiopaque foreign bodies are demonstrated. Similar appearance to previous study. IMPRESSION: 1. Degenerative changes in the knee and ankle. No acute bony abnormalities. 2. Extensive soft tissue calcification, likely phleboliths or dystrophic. No radiopaque foreign bodies or soft tissue gas. Electronically Signed   By: Burman Nieves M.D.   On: 05/19/2023 19:51    Microbiology: Results for orders placed or performed during the hospital encounter of 05/19/23  Culture, blood (routine x 2)     Status: Abnormal (Preliminary result)   Collection Time: 05/19/23 12:12 PM   Specimen: BLOOD  Result Value Ref Range Status   Specimen Description BLOOD RIGHT ANTECUBITAL  Final   Special Requests   Final    BOTTLES DRAWN AEROBIC AND ANAEROBIC Blood Culture adequate volume   Culture  Setup Time   Final    GRAM POSITIVE COCCI IN CHAINS ANAEROBIC BOTTLE ONLY CRITICAL RESULT CALLED TO, READ BACK BY AND VERIFIED WITH: PHARMD ELIZABETH M. [id number], ADC    Culture (A)  Final    CUTIBACTERIUM ACNES Standardized susceptibility testing for this organism is not available. Performed at [facility], [address], [place], [place] [address]    Report Status PENDING  Incomplete  Blood Culture ID Panel (Reflexed)     Status: None   Collection Time: 05/19/23 12:12 PM  Result Value Ref Range Status   Enterococcus faecalis NOT DETECTED NOT DETECTED Final   Enterococcus Faecium NOT DETECTED NOT DETECTED Final   Listeria monocytogenes NOT DETECTED NOT DETECTED Final   Staphylococcus species NOT DETECTED NOT DETECTED Final   Staphylococcus aureus (BCID) NOT DETECTED NOT DETECTED Final   Staphylococcus epidermidis NOT DETECTED NOT  DETECTED Final   Staphylococcus lugdunensis NOT DETECTED NOT DETECTED Final   Streptococcus species NOT DETECTED NOT DETECTED Final   Streptococcus agalactiae NOT DETECTED NOT DETECTED Final   Streptococcus pneumoniae NOT DETECTED NOT DETECTED Final   Streptococcus pyogenes NOT DETECTED NOT DETECTED Final   A.calcoaceticus-baumannii NOT DETECTED NOT DETECTED Final   Bacteroides fragilis NOT DETECTED NOT DETECTED Final   Enterobacterales NOT DETECTED NOT DETECTED Final   Enterobacter cloacae complex NOT DETECTED NOT DETECTED Final   Escherichia coli NOT DETECTED NOT DETECTED Final   Klebsiella aerogenes NOT DETECTED NOT DETECTED Final   Klebsiella oxytoca NOT DETECTED NOT DETECTED Final   Klebsiella pneumoniae NOT DETECTED NOT DETECTED Final   Proteus species NOT DETECTED NOT DETECTED Final   Salmonella species NOT DETECTED NOT DETECTED Final   Serratia  marcescens NOT DETECTED NOT DETECTED Final   Haemophilus influenzae NOT DETECTED NOT DETECTED Final   Neisseria meningitidis NOT DETECTED NOT DETECTED Final   Pseudomonas aeruginosa NOT DETECTED NOT DETECTED Final   Stenotrophomonas maltophilia NOT DETECTED NOT DETECTED Final   Candida albicans NOT DETECTED NOT DETECTED Final   Candida auris NOT DETECTED NOT DETECTED Final   Candida glabrata NOT DETECTED NOT DETECTED Final   Candida krusei NOT DETECTED NOT DETECTED Final   Candida parapsilosis NOT DETECTED NOT DETECTED Final   Candida tropicalis NOT DETECTED NOT DETECTED Final   Cryptococcus neoformans/gattii NOT DETECTED NOT DETECTED Final    Comment: Performed at [facility], [address], [place], [place] [address]  Culture, blood (routine x 2)     Status: None   Collection Time: 05/19/23  6:10 PM   Specimen: BLOOD RIGHT ARM  Result Value Ref Range Status   Specimen Description BLOOD RIGHT ARM  Final   Special Requests   Final    BOTTLES DRAWN AEROBIC AND ANAEROBIC Blood Culture results may not be optimal due to an  inadequate volume of blood received in culture bottles   Culture   Final    NO GROWTH 5 DAYS Performed at [facility], [address], [place], [place] [address]    Report Status 05/24/2023 FINAL  Final    Labs: CBC: Recent Labs  Lab 05/19/23 1228 05/19/23 2110 05/20/23 0635 05/21/23 0540 05/22/23 0614 05/23/23 0433 05/24/23 0705  WBC 7.3   < > 7.4 4.6 4.4 3.6* 5.1  NEUTROABS 4.5  --   --   --   --   --   --   HGB 10.0*   < > 10.4* 10.1* 9.5* 10.3* 9.8*  HCT 32.0*   < > 33.3* 31.1* 29.8* 32.6* 31.3*  MCV 91.4   < > 89.3 87.4 86.4 88.6 89.7  PLT 467*   < > 418* 355 316 339 331   < > = values in this interval not displayed.   Basic Metabolic Panel: Recent Labs  Lab 05/20/23 0635 05/21/23 0540 05/22/23 0614 05/23/23 0433 05/24/23 0705  NA 139 133* 136 137 139  K 3.8 3.4* 3.6 3.6 3.8  CL 111 103 107 109 112*  CO2 23 24 22 22 22   GLUCOSE 103* 95 82 96 97  BUN <5* 5* 5* 5* 6*  CREATININE 0.83 0.85 0.90 0.70 0.78  CALCIUM 8.8* 8.4* 8.5* 8.7* 8.8*  MG 2.1  --   --   --   --   PHOS 3.3  --   --   --   --    Liver Function Tests: Recent Labs  Lab 05/19/23 1228  AST 15  ALT 8  ALKPHOS 81  BILITOT 0.7  PROT 8.8*  ALBUMIN 2.7*   CBG: Recent Labs  Lab 05/19/23 1231  GLUCAP 97    Discharge time spent: {LESS THAN/GREATER THAN:26388} 30 minutes.  Signed: [name], MD [facility] 05/24/2023

## 2023-05-24 NOTE — TOC Transition Note (Signed)
Transition of Care [facility]) - CM/SW Discharge Note   Patient Details  Name: [name] MRN: [id number] Date of Birth: 1950/08/09  [facility] CM/SW Contact:  Ronny Bacon, RN Phone Number: 05/24/2023, 2:43 PM   Clinical Narrative:  Patient is being discharged today. Cory with [facility] made aware.     Final next level of care: Home w Home Health Services Barriers to Discharge: No Barriers Identified   Patient Goals and CMS Choice CMS Medicare.gov Compare Post Acute Care list provided to:: Patient Choice offered to / list presented to : Patient  Discharge Placement                         Discharge Plan and Services Additional resources added to the After Visit Summary for     Discharge Planning Services: CM Consult Post Acute Care Choice: Home Health          DME Arranged: N/A         HH Arranged: RN, PT, OT, Nurse's Aide HH Agency: [facility] Date [facility] Agency Contacted: 05/21/23 Time HH Agency Contacted: 1021 Representative spoke with at [facility] Agency: Kandee Keen  Social Determinants of Health (SDOH) Interventions SDOH Screenings   Food Insecurity: No Food Insecurity (05/19/2023)  Housing: Low Risk  (05/19/2023)  Transportation Needs: No Transportation Needs (05/19/2023)  Utilities: Not At Risk (05/19/2023)  Tobacco Use: Low Risk  (05/20/2023)     Readmission Risk Interventions     No data to display

## 2023-06-03 ENCOUNTER — Ambulatory Visit: Payer: Medicare PPO | Admitting: Family

## 2023-06-03 DIAGNOSIS — S81801D Unspecified open wound, right lower leg, subsequent encounter: Secondary | ICD-10-CM | POA: Diagnosis not present

## 2023-06-03 DIAGNOSIS — E11621 Type 2 diabetes mellitus with foot ulcer: Secondary | ICD-10-CM

## 2023-06-03 DIAGNOSIS — I890 Lymphedema, not elsewhere classified: Secondary | ICD-10-CM | POA: Diagnosis not present

## 2023-06-03 DIAGNOSIS — I87333 Chronic venous hypertension (idiopathic) with ulcer and inflammation of bilateral lower extremity: Secondary | ICD-10-CM

## 2023-06-03 DIAGNOSIS — L97401 Non-pressure chronic ulcer of unspecified heel and midfoot limited to breakdown of skin: Secondary | ICD-10-CM

## 2023-06-03 DIAGNOSIS — I872 Venous insufficiency (chronic) (peripheral): Secondary | ICD-10-CM

## 2023-06-05 ENCOUNTER — Encounter: Payer: Self-pay | Admitting: Family

## 2023-06-05 NOTE — Progress Notes (Signed)
Office Visit Note   Patient: [name]           Date of Birth: 1950/09/12           MRN: [id number] Visit Date: 06/03/2023              Requested by: Hillery Aldo, NP [address] [place],  [place] [address] PCP: Hillery Aldo, NP  Chief Complaint  Patient presents with   Right Leg - Wound Check   Left Leg - Wound Check      HPI: The patient is a 72 year old woman who presents in follow-up for management of her chronic bilateral lower extremity edema and ulcerations.  She was last seen in November  She reports her husband has been assisting her with her dressing changes they are using dry dressings and Ace wraps. Legs depended for majority of the day. In wheelchair for mobility today  Has not had any fever chills or drainage concerning for infection  Assessment & Plan: Visit Diagnoses: No diagnosis found.  Plan: She may continue with her current wound care daily dial soap cleansing compression with Ace. if she can tolerate her compression stockings this would be best she may continue with dry dressing changes elevate is much as possible  Follow-Up Instructions: No follow-ups on file.   Ortho Exam  Patient is alert, oriented, no adenopathy, well-dressed, normal affect, normal respiratory effort. On examination bilateral lower extremities the ulcers are stable. Less maceration than last month.  On examination of the left foot the medial ankle and calf ulcers.  Are superficial these are stable in size there is surrounding maceration no active drainage no erythema or warmth.  On examination of the left foot she continues with significant maceration and ulceration over the dorsum of her foot this is also unchanged on examination right lower extremity she does also have ulceration to the dorsal ankle and foot.  This is stable. there is no fibrinous tissue or purulence  Imaging: No results found.     Labs: Lab Results  Component Value Date   HGBA1C 8.1 (H)  10/23/2022   HGBA1C 9.5 (H) 06/11/2022   HGBA1C 11.5 (A) 05/21/2021   ESRSEDRATE 110 (H) 05/20/2023   ESRSEDRATE 90 (H) 10/23/2022   ESRSEDRATE 117 (H) 03/06/2021   CRP 4.7 (H) 05/20/2023   CRP 3.8 (H) 10/23/2022   CRP 43.7 (H) 03/06/2021   LABURIC 7.7 (H) 05/20/2023   REPTSTATUS 05/24/2023 FINAL 05/19/2023   GRAMSTAIN  10/24/2022    ABUNDANT SQUAMOUS EPITHELIAL CELLS PRESENT FEW WBC PRESENT, PREDOMINANTLY PMN MODERATE GRAM POSITIVE RODS MODERATE GRAM POSITIVE COCCI FEW GRAM NEGATIVE RODS    CULT  05/19/2023    NO GROWTH 5 DAYS Performed at [facility], [address], [place], [place] [address]    LABORGA PROTEUS MIRABILIS 10/24/2022   LABORGA STAPHYLOCOCCUS AUREUS 10/24/2022   LABORGA PSEUDOMONAS AERUGINOSA 10/24/2022     Lab Results  Component Value Date   ALBUMIN 2.7 (L) 05/19/2023   ALBUMIN 2.7 (L) 02/19/2023   ALBUMIN 2.7 (L) 02/17/2023   PREALBUMIN 12.5 (L) 03/16/2017    Lab Results  Component Value Date   MG 2.1 05/20/2023   MG 2.1 06/27/2022   MG 1.9 06/25/2022   No results found for: "VD25OH"  Lab Results  Component Value Date   PREALBUMIN 12.5 (L) 03/16/2017      Latest Ref Rng & Units 05/24/2023    7:05 AM 05/23/2023    4:33 AM 05/22/2023  6:14 AM  CBC EXTENDED  WBC 4.0 - 10.5 K/uL 5.1  3.6  4.4   RBC 3.87 - 5.11 MIL/uL 3.49  3.68  3.45   Hemoglobin 12.0 - 15.0 g/dL 9.8  32.9  9.5   HCT 51.8 - 46.0 % 31.3  32.6  29.8   Platelets 150 - 400 K/uL 331  339  316      There is no height or weight on file to calculate BMI.  Orders:  No orders of the defined types were placed in this encounter.  No orders of the defined types were placed in this encounter.    Procedures: No procedures performed  Clinical Data: No additional findings.  ROS:  All other systems negative, except as noted in the HPI. Review of Systems  Objective: Vital Signs: There were no vitals taken for this visit.  Specialty Comments:  No specialty comments  available.  PMFS History: Patient Active Problem List   Diagnosis Date Noted   Ulcer of extremity due to chronic venous insufficiency (HCC) 02/19/2023   Lymphedema 02/19/2023   SBO (small bowel obstruction) (HCC) 02/18/2023   GAD (generalized anxiety disorder) 02/18/2023   Asthma, chronic 02/18/2023   Diabetic ulcer of left lower leg (HCC) 10/24/2022   Chronic venous hypertension (idiopathic) with ulcer and inflammation of bilateral lower extremity (HCC) 10/24/2022   Non-healing wound of lower extremity 10/23/2022   Bacteria in urine 06/26/2022   Stasis ulcer of lower extremity (HCC) 06/26/2022   Elevated troponin 06/25/2022   Abnormal urinalysis 06/25/2022   History of CVA (cerebrovascular accident) 06/25/2022   Memory loss 06/25/2022   Cryptogenic stroke (HCC) 06/11/2022   Middle cerebral artery embolism, 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   Wound infection 02/05/2021   Venous stasis dermatitis of both lower extremities    Pneumonia due to COVID-19 virus 06/29/2019   Hypophosphatemia 06/24/2019   Obesity 06/23/2019   Insulin dependent type 2 diabetes mellitus (HCC) 06/22/2019   Anxiety 06/22/2019   Essential hypertension 06/22/2019   Hyperlipidemia 06/22/2019   COVID-19 06/21/2019   Severe protein-calorie malnutrition (HCC) 06/21/2019   Acute respiratory failure with hypoxia (HCC) 06/21/2019   Diabetic foot ulcer associated with type 2 diabetes mellitus (HCC)    Cellulitis 07/05/2016   Depression 05/30/2016   Lower extremity ulceration (HCC) 05/30/2016   Generalized weakness 05/30/2016   Left arm weakness 05/30/2016   Left Lower extremity pain  04/02/2016   Lower extremity pain, inferior, left 04/02/2016   Left leg pain    Personal history of noncompliance with medical treatment, presenting hazards to health 03/31/2016   Jaw pain 03/27/2016   Left leg cellulitis 03/24/2016   Cellulitis of left lower extremity    Primary insomnia     Adjustment disorder    Morbid obesity (HCC) 01/27/2016   Wheelchair dependent 01/27/2016   Gout 01/27/2016   Arthritis 01/27/2016   Uncontrolled type 2 diabetes mellitus with hypoglycemia, with long-term current use of insulin (HCC) 01/25/2016   Cellulitis of left leg 01/11/2016   Insulin-requiring or dependent type II diabetes mellitus (HCC) 01/11/2016   Past Medical History:  Diagnosis Date   Anxiety    Arthritis    "back, arms, legs" (03/24/2016)   Asthma    Chronic lower back pain    Colonic polyp    last colonoscopy done in 2009 with normal results per medical record   Depressive disorder    Gastric polyp    Gout    has taken  allopurinol 300mg  once daily in past   Headache    History of hiatal hernia    Hypertension    Migraine    "none in awhile; might have a couple/year" (03/24/2016)   Mixed hyperlipidemia    01/2016 Total chol 141, HDL 59, LDL 63, ration 1.1   Osteoarthritis    TIA (transient ischemic attack) 11/2014   Type II diabetes mellitus (HCC)     Family History  Problem Relation Age of Onset   Stroke Father    Stroke Brother    Cancer Other    Gout Mother    Hypertension Mother    Arthritis Mother    Asthma Mother    Stroke Brother     Past Surgical History:  Procedure Laterality Date   ARTERY BIOPSY Right 08/12/2016   Procedure: BIOPSY TEMPORAL ARTERY;  Surgeon: Sherren Kerns, MD;  Location: [facility] OR;  Service: Vascular;  Laterality: Right;  BIOPSY TEMPORAL ARTERY   BREAST BIOPSY Left ~ 2015   benign   CARPAL TUNNEL RELEASE Bilateral    DILATION AND CURETTAGE OF UTERUS     I & D EXTREMITY Bilateral 10/24/2022   Procedure: DEBRIDEMENT LEFT LEG AND RIGHT ANKLE;  Surgeon: Nadara Mustard, MD;  Location: [facility] OR;  Service: Orthopedics;  Laterality: Bilateral;   IR CT HEAD LTD  06/11/2022   IR PERCUTANEOUS ART THROMBECTOMY/INFUSION INTRACRANIAL INC DIAG ANGIO  06/11/2022   IR US GUIDE VASC ACCESS RIGHT  06/11/2022   KNEE ARTHROSCOPY Right 2003   in [facility]    LAPAROSCOPIC CHOLECYSTECTOMY     LOOP RECORDER INSERTION N/A 06/26/2022   Procedure: LOOP RECORDER INSERTION;  Surgeon: Regan Lemming, MD;  Location: [facility] INVASIVE CV LAB;  Service: Cardiovascular;  Laterality: N/A;   RADIOLOGY WITH ANESTHESIA N/A 06/11/2022   Procedure: IR WITH ANESTHESIA;  Surgeon: Radiologist, Medication, MD;  Location: [facility] OR;  Service: Radiology;  Laterality: N/A;   TUBAL LIGATION     VAGINAL HYSTERECTOMY  1982   Social History   Occupational History   Not on file  Tobacco Use   Smoking status: Never   Smokeless tobacco: Never  Vaping Use   Vaping status: Never Used  Substance and Sexual Activity   Alcohol use: No   Drug use: No   Sexual activity: Never

## 2023-06-09 ENCOUNTER — Emergency Department (HOSPITAL_COMMUNITY): Payer: Medicare PPO

## 2023-06-09 ENCOUNTER — Inpatient Hospital Stay (HOSPITAL_COMMUNITY)
Admission: EM | Admit: 2023-06-09 | Discharge: 2023-06-11 | DRG: 101 | Disposition: A | Discharge status: Home Health | Payer: Medicare PPO | Attending: Internal Medicine | Admitting: Internal Medicine

## 2023-06-09 ENCOUNTER — Other Ambulatory Visit: Payer: Self-pay

## 2023-06-09 ENCOUNTER — Encounter (HOSPITAL_COMMUNITY): Payer: Self-pay

## 2023-06-09 ENCOUNTER — Observation Stay (HOSPITAL_COMMUNITY): Payer: Medicare PPO

## 2023-06-09 DIAGNOSIS — Z794 Long term (current) use of insulin: Secondary | ICD-10-CM

## 2023-06-09 DIAGNOSIS — R569 Unspecified convulsions: Secondary | ICD-10-CM | POA: Diagnosis not present

## 2023-06-09 DIAGNOSIS — Z8249 Family history of ischemic heart disease and other diseases of the circulatory system: Secondary | ICD-10-CM

## 2023-06-09 DIAGNOSIS — L98499 Non-pressure chronic ulcer of skin of other sites with unspecified severity: Secondary | ICD-10-CM

## 2023-06-09 DIAGNOSIS — I639 Cerebral infarction, unspecified: Principal | ICD-10-CM

## 2023-06-09 DIAGNOSIS — Z8261 Family history of arthritis: Secondary | ICD-10-CM

## 2023-06-09 DIAGNOSIS — R4701 Aphasia: Secondary | ICD-10-CM | POA: Diagnosis present

## 2023-06-09 DIAGNOSIS — Z90710 Acquired absence of both cervix and uterus: Secondary | ICD-10-CM

## 2023-06-09 DIAGNOSIS — Z9181 History of falling: Secondary | ICD-10-CM

## 2023-06-09 DIAGNOSIS — Z8673 Personal history of transient ischemic attack (TIA), and cerebral infarction without residual deficits: Secondary | ICD-10-CM

## 2023-06-09 DIAGNOSIS — Z91040 Latex allergy status: Secondary | ICD-10-CM

## 2023-06-09 DIAGNOSIS — Z860100 Personal history of colon polyps, unspecified: Secondary | ICD-10-CM

## 2023-06-09 DIAGNOSIS — M545 Low back pain, unspecified: Secondary | ICD-10-CM | POA: Diagnosis present

## 2023-06-09 DIAGNOSIS — Z823 Family history of stroke: Secondary | ICD-10-CM

## 2023-06-09 DIAGNOSIS — Z825 Family history of asthma and other chronic lower respiratory diseases: Secondary | ICD-10-CM

## 2023-06-09 DIAGNOSIS — R2990 Unspecified symptoms and signs involving the nervous system: Secondary | ICD-10-CM | POA: Diagnosis not present

## 2023-06-09 DIAGNOSIS — R29810 Facial weakness: Secondary | ICD-10-CM | POA: Diagnosis present

## 2023-06-09 DIAGNOSIS — Z6837 Body mass index (BMI) 37.0-37.9, adult: Secondary | ICD-10-CM

## 2023-06-09 DIAGNOSIS — Z79899 Other long term (current) drug therapy: Secondary | ICD-10-CM

## 2023-06-09 DIAGNOSIS — E119 Type 2 diabetes mellitus without complications: Secondary | ICD-10-CM

## 2023-06-09 DIAGNOSIS — Z888 Allergy status to other drugs, medicaments and biological substances status: Secondary | ICD-10-CM

## 2023-06-09 DIAGNOSIS — R471 Dysarthria and anarthria: Secondary | ICD-10-CM | POA: Diagnosis present

## 2023-06-09 DIAGNOSIS — E782 Mixed hyperlipidemia: Secondary | ICD-10-CM | POA: Diagnosis present

## 2023-06-09 DIAGNOSIS — F32A Depression, unspecified: Secondary | ICD-10-CM | POA: Diagnosis present

## 2023-06-09 DIAGNOSIS — J45909 Unspecified asthma, uncomplicated: Secondary | ICD-10-CM | POA: Diagnosis present

## 2023-06-09 DIAGNOSIS — H5440 Blindness, one eye, unspecified eye: Secondary | ICD-10-CM | POA: Diagnosis present

## 2023-06-09 DIAGNOSIS — I10 Essential (primary) hypertension: Secondary | ICD-10-CM | POA: Diagnosis present

## 2023-06-09 DIAGNOSIS — F411 Generalized anxiety disorder: Secondary | ICD-10-CM | POA: Diagnosis present

## 2023-06-09 DIAGNOSIS — E669 Obesity, unspecified: Secondary | ICD-10-CM | POA: Diagnosis present

## 2023-06-09 DIAGNOSIS — Z9049 Acquired absence of other specified parts of digestive tract: Secondary | ICD-10-CM

## 2023-06-09 DIAGNOSIS — E1165 Type 2 diabetes mellitus with hyperglycemia: Secondary | ICD-10-CM | POA: Diagnosis present

## 2023-06-09 DIAGNOSIS — E1140 Type 2 diabetes mellitus with diabetic neuropathy, unspecified: Secondary | ICD-10-CM | POA: Diagnosis present

## 2023-06-09 DIAGNOSIS — G40209 Localization-related (focal) (partial) symptomatic epilepsy and epileptic syndromes with complex partial seizures, not intractable, without status epilepticus: Secondary | ICD-10-CM | POA: Diagnosis not present

## 2023-06-09 DIAGNOSIS — Z7951 Long term (current) use of inhaled steroids: Secondary | ICD-10-CM

## 2023-06-09 DIAGNOSIS — I69334 Monoplegia of upper limb following cerebral infarction affecting left non-dominant side: Secondary | ICD-10-CM

## 2023-06-09 DIAGNOSIS — G8929 Other chronic pain: Secondary | ICD-10-CM | POA: Diagnosis present

## 2023-06-09 DIAGNOSIS — G249 Dystonia, unspecified: Secondary | ICD-10-CM | POA: Diagnosis present

## 2023-06-09 DIAGNOSIS — Z7982 Long term (current) use of aspirin: Secondary | ICD-10-CM

## 2023-06-09 LAB — I-STAT CHEM 8, ED
BUN: 5 mg/dL — ABNORMAL LOW (ref 8–23)
Calcium, Ion: 1.13 mmol/L — ABNORMAL LOW (ref 1.15–1.40)
Chloride: 108 mmol/L (ref 98–111)
Creatinine, Ser: 0.8 mg/dL (ref 0.44–1.00)
Glucose, Bld: 151 mg/dL — ABNORMAL HIGH (ref 70–99)
HCT: 38 % (ref 36.0–46.0)
Hemoglobin: 12.9 g/dL (ref 12.0–15.0)
Potassium: 4.7 mmol/L (ref 3.5–5.1)
Sodium: 140 mmol/L (ref 135–145)
TCO2: 25 mmol/L (ref 22–32)

## 2023-06-09 LAB — COMPREHENSIVE METABOLIC PANEL
ALT: 10 U/L (ref 0–44)
AST: 19 U/L (ref 15–41)
Albumin: 3.1 g/dL — ABNORMAL LOW (ref 3.5–5.0)
Alkaline Phosphatase: 103 U/L (ref 38–126)
Anion gap: 11 (ref 5–15)
BUN: 5 mg/dL — ABNORMAL LOW (ref 8–23)
CO2: 20 mmol/L — ABNORMAL LOW (ref 22–32)
Calcium: 9.7 mg/dL (ref 8.9–10.3)
Chloride: 107 mmol/L (ref 98–111)
Creatinine, Ser: 0.86 mg/dL (ref 0.44–1.00)
GFR, Estimated: >60 mL/min (ref >60)
Glucose, Bld: 150 mg/dL — ABNORMAL HIGH (ref 70–99)
Potassium: 4 mmol/L (ref 3.5–5.1)
Sodium: 138 mmol/L (ref 135–145)
Total Bilirubin: 0.8 mg/dL (ref <1.2)
Total Protein: 8.5 g/dL — ABNORMAL HIGH (ref 6.5–8.1)

## 2023-06-09 LAB — CBC
HCT: 39.1 % (ref 36.0–46.0)
Hemoglobin: 12.3 g/dL (ref 12.0–15.0)
MCH: 28.7 pg (ref 26.0–34.0)
MCHC: 31.5 g/dL (ref 30.0–36.0)
MCV: 91.1 fL (ref 80.0–100.0)
Platelets: 335 10*3/uL (ref 150–400)
RBC: 4.29 MIL/uL (ref 3.87–5.11)
RDW: 15.3 % (ref 11.5–15.5)
WBC: 6.8 10*3/uL (ref 4.0–10.5)
nRBC: 0 % (ref 0.0–0.2)

## 2023-06-09 LAB — CBG MONITORING, ED
Glucose-Capillary: 152 mg/dL — ABNORMAL HIGH (ref 70–99)
Glucose-Capillary: 99 mg/dL (ref 70–99)
Glucose-Capillary: 132 mg/dL — ABNORMAL HIGH (ref 70–99)

## 2023-06-09 LAB — DIFFERENTIAL
Abs Immature Granulocytes: 0.01 10*3/uL (ref 0.00–0.07)
Basophils Absolute: 0 10*3/uL (ref 0.0–0.1)
Basophils Relative: 0 %
Eosinophils Absolute: 0.2 10*3/uL (ref 0.0–0.5)
Eosinophils Relative: 3 %
Immature Granulocytes: 0 %
Lymphocytes Relative: 27 %
Lymphs Abs: 1.8 10*3/uL (ref 0.7–4.0)
Monocytes Absolute: 0.5 10*3/uL (ref 0.1–1.0)
Monocytes Relative: 8 %
Neutro Abs: 4.3 10*3/uL (ref 1.7–7.7)
Neutrophils Relative %: 62 %

## 2023-06-09 LAB — URINALYSIS, ROUTINE W REFLEX MICROSCOPIC
Bilirubin Urine: NEGATIVE
Glucose, UA: NEGATIVE mg/dL
Hgb urine dipstick: NEGATIVE
Ketones, ur: NEGATIVE mg/dL
Leukocytes,Ua: NEGATIVE
Nitrite: NEGATIVE
Protein, ur: NEGATIVE mg/dL
Specific Gravity, Urine: 1.006 (ref 1.005–1.030)
pH: 7 (ref 5.0–8.0)

## 2023-06-09 LAB — RAPID URINE DRUG SCREEN, HOSP PERFORMED
Amphetamines: NOT DETECTED
Barbiturates: NOT DETECTED
Benzodiazepines: NOT DETECTED
Cocaine: NOT DETECTED
Opiates: NOT DETECTED
Tetrahydrocannabinol: NOT DETECTED

## 2023-06-09 LAB — PROTIME-INR
INR: 1 (ref 0.8–1.2)
Prothrombin Time: 13.9 s (ref 11.4–15.2)

## 2023-06-09 LAB — GLUCOSE, CAPILLARY: Glucose-Capillary: 122 mg/dL — ABNORMAL HIGH (ref 70–99)

## 2023-06-09 LAB — APTT: aPTT: 32 s (ref 24–36)

## 2023-06-09 LAB — ETHANOL: Alcohol, Ethyl (B): <10 mg/dL (ref <10)

## 2023-06-09 MED ORDER — INSULIN ASPART 100 UNIT/ML IJ SOLN
0.0000 [IU] | Freq: Three times a day (TID) | INTRAMUSCULAR | Status: DC
  Administered 2023-06-09 – 2023-06-10 (×2): 2 [IU] via SUBCUTANEOUS
  Administered 2023-06-11: 3 [IU] via SUBCUTANEOUS

## 2023-06-09 MED ORDER — ATENOLOL 25 MG PO TABS
25.0000 mg | ORAL_TABLET | Freq: Every day | ORAL | Status: DC
  Administered 2023-06-09 – 2023-06-10 (×2): 25 mg via ORAL
  Filled 2023-06-09 (×2): qty 1

## 2023-06-09 MED ORDER — ACETAMINOPHEN 325 MG PO TABS: 650.0000 mg | ORAL_TABLET | Freq: Four times a day (QID) | ORAL | Status: DC | PRN

## 2023-06-09 MED ORDER — ENOXAPARIN SODIUM 40 MG/0.4ML IJ SOSY
40.0000 mg | PREFILLED_SYRINGE | INTRAMUSCULAR | Status: DC
  Administered 2023-06-09 – 2023-06-10 (×2): 40 mg via SUBCUTANEOUS
  Filled 2023-06-09 (×2): qty 0.4

## 2023-06-09 MED ORDER — ALBUTEROL SULFATE (2.5 MG/3ML) 0.083% IN NEBU: 3.0000 mL | INHALATION_SOLUTION | Freq: Four times a day (QID) | RESPIRATORY_TRACT | Status: DC | PRN

## 2023-06-09 MED ORDER — GABAPENTIN 300 MG PO CAPS
300.0000 mg | ORAL_CAPSULE | Freq: Every day | ORAL | Status: DC
  Administered 2023-06-10 – 2023-06-11 (×2): 300 mg via ORAL
  Filled 2023-06-09 (×2): qty 1

## 2023-06-09 MED ORDER — MOMETASONE FURO-FORMOTEROL FUM 200-5 MCG/ACT IN AERO
2.0000 | INHALATION_SPRAY | Freq: Two times a day (BID) | RESPIRATORY_TRACT | Status: DC
  Administered 2023-06-10 – 2023-06-11 (×3): 2 via RESPIRATORY_TRACT
  Filled 2023-06-09: qty 8.8

## 2023-06-09 MED ORDER — ASPIRIN 81 MG PO TBEC
81.0000 mg | DELAYED_RELEASE_TABLET | Freq: Every day | ORAL | Status: DC
  Administered 2023-06-09 – 2023-06-11 (×3): 81 mg via ORAL
  Filled 2023-06-09 (×3): qty 1

## 2023-06-09 MED ORDER — LORAZEPAM 2 MG/ML IJ SOLN
INTRAMUSCULAR | Status: AC
  Filled 2023-06-09: qty 1

## 2023-06-09 MED ORDER — ATENOLOL 25 MG PO TABS
50.0000 mg | ORAL_TABLET | Freq: Every morning | ORAL | Status: DC
  Administered 2023-06-10 – 2023-06-11 (×2): 50 mg via ORAL
  Filled 2023-06-09 (×2): qty 2

## 2023-06-09 MED ORDER — ATENOLOL 25 MG PO TABS: 25.0000 mg | ORAL_TABLET | ORAL | Status: DC

## 2023-06-09 MED ORDER — ATORVASTATIN CALCIUM 10 MG PO TABS
20.0000 mg | ORAL_TABLET | Freq: Every day | ORAL | Status: DC
  Administered 2023-06-09 – 2023-06-10 (×2): 20 mg via ORAL
  Filled 2023-06-09 (×2): qty 2

## 2023-06-09 MED ORDER — DONEPEZIL HCL 10 MG PO TABS: 10.0000 mg | ORAL_TABLET | Freq: Every day | ORAL | Status: DC

## 2023-06-09 MED ORDER — LORAZEPAM 2 MG/ML IJ SOLN
2.0000 mg | Freq: Once | INTRAMUSCULAR | Status: AC
  Administered 2023-06-09: 2 mg via INTRAVENOUS

## 2023-06-09 MED ORDER — NIFEDIPINE ER OSMOTIC RELEASE 60 MG PO TB24
90.0000 mg | ORAL_TABLET | Freq: Every day | ORAL | Status: DC
  Administered 2023-06-10 – 2023-06-11 (×2): 90 mg via ORAL
  Filled 2023-06-09 (×2): qty 1

## 2023-06-09 MED ORDER — INSULIN GLARGINE-YFGN 100 UNIT/ML ~~LOC~~ SOLN
15.0000 [IU] | Freq: Every day | SUBCUTANEOUS | Status: DC
Start: 2023-06-09 — End: 2023-06-11
  Administered 2023-06-09 – 2023-06-10 (×2): 15 [IU] via SUBCUTANEOUS
  Filled 2023-06-09 (×4): qty 0.15

## 2023-06-09 MED ORDER — ACETAMINOPHEN 650 MG RE SUPP: 650.0000 mg | Freq: Four times a day (QID) | RECTAL | Status: DC | PRN

## 2023-06-09 NOTE — Code Documentation (Signed)
[name] is a 72 yr old female arriving to [facility] on 06/09/2023. She is coming from home where she was LKW last night at 2300, and is now c/o aphasia. Code stroke activated in ED. Pt is not on any known thinners.     Stroke team met up with patient in CT. Pt had previously been cleared by EDP, Labs and CBG obtained. NIHSS 6. Pt with disorientation (wrong month), weak left leg, dysarthria and extinction of the Left to double simultaneous stimulation (vision and touch). Pt exhibited rhythmic twitching of the left face while in scanner. 2 mg ativan given. The following imaging was obtained: CT. Per Dr. Otelia Limes, CT is negative for hemorrhage.     Pt returned to ED room 33 where her workup will continue. She will need q 2 hr VS and NIHSS for 12 hours, then q 4. She will need to remain NPO until stroke swallow eval can be done. Bedside handoff with ED RN complete. Pt is ineligible for TNK as her LKW is outside of the treatment window. She is not eligible for thrombectomy as her clinical exam is LVO negative.

## 2023-06-09 NOTE — ED Notes (Signed)
ED TO INPATIENT HANDOFF REPORT  ED Nurse Name and Phone #: Berna Spare [phone]  S Name/Age/Gender [name] 72 y.o. female Room/Bed: 043C/043C  Code Status   Code Status: Full Code  Home/SNF/Other Home Patient oriented to: self, place, time, and situation Is this baseline? Yes   Triage Complete: Triage complete  Chief Complaint Stroke-like symptoms [R29.90]  Triage Note Pt BIB EMS due to dystonic reaction to BP medicine. Sx started at 0745. Pt has tongue swelling and twitiching and drooling,. EMS gave 50mg  of benadryl    Allergies Allergies  Allergen Reactions   Ace Inhibitors Swelling   Latex Itching and Swelling   Ultram [Tramadol] Nausea And Vomiting   Zanaflex [Tizanidine] Other (See Comments)    Tremors    Diprivan [Propofol] Itching   Metformin And Related Other (See Comments)    Tremors  Chills    Level of Care/Admitting Diagnosis ED Disposition     ED Disposition  Admit   Condition  --   Comment  Hospital Area: [facility] [[id number]]  Level of Care: Telemetry Medical [[id number]]  May place patient in observation at [facility] or [facility] if equivalent level of care is available:: No  Covid Evaluation: Asymptomatic - no recent exposure (last 10 days) testing not required  Diagnosis: Stroke-like symptoms [725552]  Admitting Physician: [name] [[id number]]  Attending Physician: [name] [[id number]]          B Medical/Surgery History Past Medical History:  Diagnosis Date   Anxiety    Arthritis    "back, arms, legs" (03/24/2016)   Asthma    Chronic lower back pain    Colonic polyp    last colonoscopy done in 2009 with normal results per medical record   Depressive disorder    Gastric polyp    Gout    has taken allopurinol 300mg  once daily in past   Headache    History of hiatal hernia    Hypertension    Migraine    "none in awhile; might have a couple/year" (03/24/2016)   Mixed hyperlipidemia    01/2016 Total  chol 141, HDL 59, LDL 63, ration 1.1   Osteoarthritis    TIA (transient ischemic attack) 11/2014   Type II diabetes mellitus (HCC)    Past Surgical History:  Procedure Laterality Date   ARTERY BIOPSY Right 08/12/2016   Procedure: BIOPSY TEMPORAL ARTERY;  Surgeon: Sherren Kerns, MD;  Location: [facility] OR;  Service: Vascular;  Laterality: Right;  BIOPSY TEMPORAL ARTERY   BREAST BIOPSY Left ~ 2015   benign   CARPAL TUNNEL RELEASE Bilateral    DILATION AND CURETTAGE OF UTERUS     I & D EXTREMITY Bilateral 10/24/2022   Procedure: DEBRIDEMENT LEFT LEG AND RIGHT ANKLE;  Surgeon: Nadara Mustard, MD;  Location: [facility] OR;  Service: Orthopedics;  Laterality: Bilateral;   IR CT HEAD LTD  06/11/2022   IR PERCUTANEOUS ART THROMBECTOMY/INFUSION INTRACRANIAL INC DIAG ANGIO  06/11/2022   IR US GUIDE VASC ACCESS RIGHT  06/11/2022   KNEE ARTHROSCOPY Right 2003   in [facility]   LAPAROSCOPIC CHOLECYSTECTOMY     LOOP RECORDER INSERTION N/A 06/26/2022   Procedure: LOOP RECORDER INSERTION;  Surgeon: Regan Lemming, MD;  Location: [facility] INVASIVE CV LAB;  Service: Cardiovascular;  Laterality: N/A;   RADIOLOGY WITH ANESTHESIA N/A 06/11/2022   Procedure: IR WITH ANESTHESIA;  Surgeon: Radiologist, Medication, MD;  Location: [facility] OR;  Service: Radiology;  Laterality: N/A;   TUBAL  LIGATION     VAGINAL HYSTERECTOMY  1982     A IV Location/Drains/Wounds Patient Lines/Drains/Airways Status     Active Line/Drains/Airways     Name Placement date Placement time Site Days   Peripheral IV 06/09/23 20 G Anterior;Proximal;Right Forearm 06/09/23  0900  Forearm  less than 1   Wound / Incision (Open or Dehisced) 06/11/22 Venous stasis ulcer Foot Anterior;Right 06/11/22  1428  Foot  363   Wound / Incision (Open or Dehisced) 06/11/22 Venous stasis ulcer Ankle Anterior;Right 06/11/22  1630  Ankle  363   Wound / Incision (Open or Dehisced) 06/11/22 Venous stasis ulcer Foot Anterior;Left 06/11/22  1428  Foot  363   Wound / Incision (Open  or Dehisced) 06/11/22 Venous stasis ulcer Foot Anterior;Left;Medial 06/11/22  1428  Foot  363   Wound / Incision (Open or Dehisced) 06/12/22 Venous stasis ulcer;Diabetic ulcer Ankle Left;Posterior 06/12/22  0800  Ankle  362   Wound / Incision (Open or Dehisced) 02/18/23 Venous stasis ulcer Foot Right;Anterior 4 cm x 7 cm x 0.2 cm 60% yellow fibrin 40% pink moist 02/18/23  0800  Foot  111   Wound / Incision (Open or Dehisced) 02/18/23 Venous stasis ulcer Foot Left;Anterior 7 cm x 9 cm x 0.2 cm 50% yellow 50% pink moist 02/18/23  0800  Foot  111   Wound / Incision (Open or Dehisced) 02/18/23 Non-pressure wound Leg Left;Medial;Lower 10 cm x 10 cm 80% pink dry 20% yellow fibrin distal portion 02/18/23  1004  Leg  111   Wound / Incision (Open or Dehisced) 05/20/23 Other (Comment) Leg Right;Left multiple full thickness wounds to bilat legs 05/20/23  --  Leg  20            Intake/Output Last 24 hours No intake or output data in the 24 hours ending 06/09/23 1603  Labs/Imaging Results for orders placed or performed during the hospital encounter of 06/09/23 (from the past 48 hours)  Protime-INR     Status: None   Collection Time: 06/09/23  8:51 AM  Result Value Ref Range   Prothrombin Time 13.9 11.4 - 15.2 seconds   INR 1.0 0.8 - 1.2    Comment: (NOTE) INR goal varies based on device and disease states. Performed at [facility], [address] [place], [place] [address]   APTT     Status: None   Collection Time: 06/09/23  8:51 AM  Result Value Ref Range   aPTT 32 24 - 36 seconds    Comment: Performed at [facility], [address], [place], [place] [address]  CBC     Status: None   Collection Time: 06/09/23  8:51 AM  Result Value Ref Range   WBC 6.8 4.0 - 10.5 K/uL   RBC 4.29 3.87 - 5.11 MIL/uL   Hemoglobin 12.3 12.0 - 15.0 g/dL   HCT 69.6 29.5 - 28.4 %   MCV 91.1 80.0 - 100.0 fL   MCH 28.7 26.0 - 34.0 pg   MCHC 31.5 30.0 - 36.0 g/dL   RDW 13.2 44.0 - 10.2 %    Platelets 335 150 - 400 K/uL   nRBC 0.0 0.0 - 0.2 %    Comment: Performed at [facility], [address] [place], [place] [address]  Differential     Status: None   Collection Time: 06/09/23  8:51 AM  Result Value Ref Range   Neutrophils Relative % 62 %   Neutro Abs 4.3 1.7 -  7.7 K/uL   Lymphocytes Relative 27 %   Lymphs Abs 1.8 0.7 - 4.0 K/uL   Monocytes Relative 8 %   Monocytes Absolute 0.5 0.1 - 1.0 K/uL   Eosinophils Relative 3 %   Eosinophils Absolute 0.2 0.0 - 0.5 K/uL   Basophils Relative 0 %   Basophils Absolute 0.0 0.0 - 0.1 K/uL   Immature Granulocytes 0 %   Abs Immature Granulocytes 0.01 0.00 - 0.07 K/uL    Comment: Performed at [facility], [address], [place], [place] [address]  Comprehensive metabolic panel     Status: Abnormal   Collection Time: 06/09/23  8:51 AM  Result Value Ref Range   Sodium 138 135 - 145 mmol/L   Potassium 4.0 3.5 - 5.1 mmol/L    Comment: HEMOLYSIS AT THIS LEVEL MAY AFFECT RESULT   Chloride 107 98 - 111 mmol/L   CO2 20 (L) 22 - 32 mmol/L   Glucose, Bld 150 (H) 70 - 99 mg/dL    Comment: Glucose reference range applies only to samples taken after fasting for at least 8 hours.   BUN 5 (L) 8 - 23 mg/dL   Creatinine, Ser 6.04 0.44 - 1.00 mg/dL   Calcium 9.7 8.9 - 54.0 mg/dL   Total Protein 8.5 (H) 6.5 - 8.1 g/dL   Albumin 3.1 (L) 3.5 - 5.0 g/dL   AST 19 15 - 41 U/L    Comment: HEMOLYSIS AT THIS LEVEL MAY AFFECT RESULT   ALT 10 0 - 44 U/L    Comment: HEMOLYSIS AT THIS LEVEL MAY AFFECT RESULT   Alkaline Phosphatase 103 38 - 126 U/L   Total Bilirubin 0.8 <1.2 mg/dL    Comment: HEMOLYSIS AT THIS LEVEL MAY AFFECT RESULT   GFR, Estimated >60 >60 mL/min    Comment: (NOTE) Calculated using the CKD-EPI Creatinine Equation (2021)    Anion gap 11 5 - 15    Comment: Performed at [facility], [address], [place], [place] [address]  CBG monitoring, ED     Status: Abnormal   Collection Time: 06/09/23  8:52 AM  Result  Value Ref Range   Glucose-Capillary 152 (H) 70 - 99 mg/dL    Comment: Glucose reference range applies only to samples taken after fasting for at least 8 hours.   Comment 1 Notify RN    Comment 2 Document in Chart   Ethanol     Status: None   Collection Time: 06/09/23  9:00 AM  Result Value Ref Range   Alcohol, Ethyl (B) <10 <10 mg/dL    Comment: (NOTE) Lowest detectable limit for serum alcohol is 10 mg/dL.  For medical purposes only. Performed at [facility], [address], [place], [place] [address]   I-stat chem 8, ED     Status: Abnormal   Collection Time: 06/09/23  9:07 AM  Result Value Ref Range   Sodium 140 135 - 145 mmol/L   Potassium 4.7 3.5 - 5.1 mmol/L   Chloride 108 98 - 111 mmol/L   BUN 5 (L) 8 - 23 mg/dL   Creatinine, Ser 9.56 0.44 - 1.00 mg/dL   Glucose, Bld 213 (H) 70 - 99 mg/dL    Comment: Glucose reference range applies only to samples taken after fasting for at least 8 hours.   Calcium, Ion 1.13 (L) 1.15 - 1.40 mmol/L   TCO2 25 22 - 32 mmol/L   Hemoglobin 12.9 12.0 - 15.0 g/dL   HCT 38.0  36.0 - 46.0 %  CBG monitoring, ED     Status: None   Collection Time: 06/09/23  2:46 PM  Result Value Ref Range   Glucose-Capillary 99 70 - 99 mg/dL    Comment: Glucose reference range applies only to samples taken after fasting for at least 8 hours.   Comment 1 Notify RN    Comment 2 Document in Chart    MR BRAIN WO CONTRAST Result Date: 06/09/2023 CLINICAL DATA:  Provided history: Seizure, new onset, no history of trauma. EXAM: MRI HEAD WITHOUT CONTRAST TECHNIQUE: Multiplanar, multiecho pulse sequences of the brain and surrounding structures were obtained without intravenous contrast. COMPARISON:  Head CT 06/09/2023.  Brain MRI 10/25/2022. FINDINGS: Intermittently motion degraded examination (with up to moderate motion degradation of the acquired sequences). Within this limitation, findings are as follows. Brain: No age advanced or lobar predominant parenchymal  atrophy. 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 Chronic hemosiderin deposition and cortical laminar necrosis at this site. Mild multifocal T2 FLAIR hyperintense signal abnormality elsewhere within the cerebral white matter and pons, nonspecific but compatible with chronic small vessel ischemic disease. No appreciable hippocampal size or signal asymmetry. There is no acute infarct. No evidence of an intracranial mass. No extra-axial fluid collection. No midline shift. Vascular: Maintained flow voids within the proximal large arterial vessels. Skull and upper cervical spine: No focal worrisome marrow lesion. Incompletely assessed cervical spondylosis. Sinuses/Orbits: No mass or acute finding within the imaged orbits. No significant paranasal sinus disease. Other: Small-volume fluid within the bilateral mastoid air cells. IMPRESSION: 1. Intermittently motion degraded exam. 2. No evidence of an acute intracranial abnormality. 3. Known chronic cortical/subcortical right MCA territory infarct within the right frontoparietal operculum/right insula. 4. Background mild chronic small vessel ischemic changes within the cerebral white matter and pons, similar to the prior brain MRI of 10/25/2022. 5. Small-volume fluid within the bilateral mastoid air cells. Electronically Signed   By: Jackey Loge D.O.   On: 06/09/2023 12:52   EEG adult Result Date: 06/09/2023 Charlsie Quest, MD     06/09/2023 11:36 AM Patient Name: [name] MRN: [id number] Epilepsy Attending: Charlsie Quest Referring Physician/Provider: Elmer Picker, NP Date: 06/09/2023 Duration: 26.19 mins Patient history: 72 year old female presented with aphasia.  EEG evaluate for seizure. Level of alertness: Awake AEDs during EEG study: Ativan Technical aspects: This EEG study was done with scalp electrodes positioned according to the 10-20 International system of  electrode placement. Electrical activity was reviewed with band pass filter of 1-70Hz , sensitivity of 7 uV/mm, display speed of 65mm/sec with a 60Hz  notched filter applied as appropriate. EEG data were recorded continuously and digitally stored.  Video monitoring was available and reviewed as appropriate. Description: The posterior dominant rhythm consists of 9 Hz activity of moderate voltage (25-35 uV) seen predominantly in posterior head regions, symmetric and reactive to eye opening and eye closing. Physiologic photic driving was not seen during photic stimulation.  Hyperventilation was not performed Of note, parts of study were technically difficult due to significant myogenic artifact. IMPRESSION: This technically difficult study is within normal limits. No seizures or epileptiform discharges were seen throughout the recording. A normal interictal EEG does not exclude the diagnosis of epilepsy. Charlsie Quest   CT HEAD CODE STROKE WO CONTRAST Result Date: 06/09/2023 CLINICAL DATA:  Code stroke. Neuro deficit, acute, stroke suspected. Aphasia. EXAM: CT HEAD WITHOUT CONTRAST TECHNIQUE: Contiguous axial images were obtained from the base of the  skull through the vertex without intravenous contrast. RADIATION DOSE REDUCTION: This exam was performed according to the departmental dose-optimization program which includes automated exposure control, adjustment of the mA and/or kV according to patient size and/or use of iterative reconstruction technique. COMPARISON:  02/17/2023 FINDINGS: Brain: The study suffers from some motion degradation. 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 No new stroke identified. No mass, hemorrhage, hydrocephalus or extra-axial collection. Vascular: No abnormal vascular finding. Skull: Negative Sinuses/Orbits: Clear/normal Other: None ASPECTS (Alberta Stroke Program Early CT Score) - Ganglionic level infarction (caudate, lentiform nuclei, internal  capsule, insula, M1-M3 cortex): 7, allowing for the old stroke. - Supraganglionic infarction (M4-M6 cortex): Three, allowing for the old stroke. Total score (0-10 with 10 being normal): 10, allowing for the old stroke. IMPRESSION: 1. No acute CT finding. 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 2. Aspects is 10, allowing for the old stroke. 3. These results were communicated to Dr. Otelia Limes at 9:33 am on 06/09/2023 by text page via the [facility] messaging system. Electronically Signed   By: Paulina Fusi M.D.   On: 06/09/2023 09:34    Pending Labs Unresulted Labs (From admission, onward)     Start     Ordered   06/16/23 0500  Creatinine, serum  (enoxaparin (LOVENOX)    CrCl >/= 30 ml/min)  Weekly,   R     Comments: while on enoxaparin therapy    06/09/23 1340   06/10/23 0500  Basic metabolic panel  Tomorrow morning,   R        06/09/23 1340   06/10/23 0500  CBC  Tomorrow morning,   R        06/09/23 1340   06/09/23 1338  Hemoglobin A1c  (Glycemic Control (SSI)  Q 4 Hours / Glycemic Control (SSI)  AC +/- HS)  Once,   R       Comments: To assess prior glycemic control    06/09/23 1340   06/09/23 0851  Urine rapid drug screen (hosp performed)  Once,   STAT        06/09/23 0852   06/09/23 0851  Urinalysis, Routine w reflex microscopic -Urine, Clean Catch  Once,   URGENT       Question:  Specimen Source  Answer:  Urine, Clean Catch   06/09/23 0852            Vitals/Pain Today's Vitals   06/09/23 1300 06/09/23 1301 06/09/23 1301 06/09/23 1419  BP: (!) 142/81   129/69  Pulse: 68 71  69  Resp: 13 15  18   Temp:   (!) 97.3 F (36.3 C)   TempSrc:   Axillary   SpO2: 94% 98%  100%  Height:      PainSc:        Isolation Precautions No active isolations  Medications Medications  albuterol (PROVENTIL) (2.5 MG/3ML) 0.083% nebulizer solution 3 mL (has no administration in time range)  mometasone-formoterol (DULERA) 200-5 MCG/ACT inhaler 2 puff (has no administration in time range)   donepezil (ARICEPT) tablet 10 mg (has no administration in time range)  gabapentin (NEURONTIN) capsule 300 mg (has no administration in time range)  insulin aspart (novoLOG) injection 0-15 Units (has no administration in time range)  enoxaparin (LOVENOX) injection 40 mg (has no administration in time range)  acetaminophen (TYLENOL) tablet 650 mg (has no administration in time range)    Or  acetaminophen (TYLENOL) suppository 650 mg (has no administration in time range)  insulin  glargine-yfgn [facility]) injection 15 Units (15 Units Subcutaneous Given 06/09/23 1455)  aspirin EC tablet 81 mg (81 mg Oral Given 06/09/23 1455)  atorvastatin (LIPITOR) tablet 20 mg (has no administration in time range)  LORazepam (ATIVAN) injection 2 mg (2 mg Intravenous Given 06/09/23 0935)    Mobility Uses power chair. Able to stand and pivot when needed.     Focused Assessments    R Recommendations: See Admitting Provider Note  Report given to:   Additional Notes:Please call or message with any questions.

## 2023-06-09 NOTE — Progress Notes (Signed)
LTM EEG hooked up and running - no initial skin breakdown - push button tested - [facility] monitoring.  

## 2023-06-09 NOTE — ED Notes (Signed)
Pt assisted to bedside commode. Incontinent episode x 1. Bed linen changed and new gown provided. Wound to RLE redressed with xeroform and rolled gauze.

## 2023-06-09 NOTE — Plan of Care (Signed)
  Problem: Education: Goal: Ability to describe self-care measures that may prevent or decrease complications (Diabetes Survival Skills Education) will improve Outcome: Progressing   Problem: Fluid Volume: Goal: Ability to maintain a balanced intake and output will improve Outcome: Progressing   Problem: Nutritional: Goal: Maintenance of adequate nutrition will improve Outcome: Progressing   Problem: Skin Integrity: Goal: Risk for impaired skin integrity will decrease Outcome: Progressing   Problem: Education: Goal: Knowledge of General Education information will improve Description: Including pain rating scale, medication(s)/side effects and non-pharmacologic comfort measures Outcome: Progressing   Problem: Clinical Measurements: Goal: Ability to maintain clinical measurements within normal limits will improve Outcome: Progressing   Problem: Elimination: Goal: Will not experience complications related to bowel motility Outcome: Progressing Goal: Will not experience complications related to urinary retention Outcome: Progressing   Problem: Pain Management: Goal: General experience of comfort will improve Outcome: Progressing   Problem: Safety: Goal: Ability to remain free from injury will improve Outcome: Progressing

## 2023-06-09 NOTE — ED Notes (Signed)
Pt still currently in MRI.

## 2023-06-09 NOTE — ED Notes (Signed)
Suction set up at bedside, pt having hard time swallowing secretions. Unable to do swallow screen at this time

## 2023-06-09 NOTE — Progress Notes (Signed)
Patient going to MRI first.

## 2023-06-09 NOTE — ED Notes (Signed)
Patient transported to MRI 

## 2023-06-09 NOTE — Progress Notes (Signed)
EEG complete - results pending 

## 2023-06-09 NOTE — ED Triage Notes (Addendum)
Pt BIB EMS due to dystonic reaction to BP medicine. Sx started at 0745. Pt has tongue swelling and twitiching and drooling,. EMS gave 50mg  of benadryl

## 2023-06-09 NOTE — Procedures (Signed)
Patient Name: [name]  MRN: [id number]  Epilepsy Attending: Charlsie Quest  Referring Physician/Provider: Elmer Picker, NP  Date: 06/09/2023 Duration: 26.19 mins  Patient history: 72 year old female presented with aphasia.  EEG evaluate for seizure.  Level of alertness: Awake  AEDs during EEG study: Ativan  Technical aspects: This EEG study was done with scalp electrodes positioned according to the 10-20 International system of electrode placement. Electrical activity was reviewed with band pass filter of 1-70Hz , sensitivity of 7 uV/mm, display speed of 76mm/sec with a 60Hz  notched filter applied as appropriate. EEG data were recorded continuously and digitally stored.  Video monitoring was available and reviewed as appropriate.  Description: The posterior dominant rhythm consists of 9 Hz activity of moderate voltage (25-35 uV) seen predominantly in posterior head regions, symmetric and reactive to eye opening and eye closing. Physiologic photic driving was not seen during photic stimulation.  Hyperventilation was not performed  Of note, parts of study were technically difficult due to significant myogenic artifact.  IMPRESSION: This technically difficult study is within normal limits. No seizures or epileptiform discharges were seen throughout the recording.  A normal interictal EEG does not exclude the diagnosis of epilepsy.   [name] [name]

## 2023-06-09 NOTE — ED Provider Notes (Signed)
[place] EMERGENCY DEPARTMENT AT [facility] Provider Note   CSN: [id number] Arrival date & time: 06/09/23  4098     History  Chief Complaint  Patient presents with   Allergic Reaction    [name] is a 72 y.o. female.   Allergic Reaction  This patient is a 72 year old female, she is on medications including atenolol and nifedipine for blood pressure, she is on mirtazapine, Latuda, Neurontin and Aricept.  She is also on [place].  She lives at home with her husband, she presents this morning after waking up and feeling like she could not talk right, felt like her tongue was swollen, she has not started any new medications recently.  I tried to speak with husband - reports that she was fine last night normal - she went to bed at 11 PM.  He confirms that she has had a prior stroke causing L hand weakness - he reports that her voice was abnormal this morning - he woke up at 7:30 when she woke him up and she couldn't speak. She is on a baby aspirin.  No new meds recently.    Home Medications Prior to Admission medications   Medication Sig Start Date End Date Taking? Authorizing Provider  acetaminophen (TYLENOL) 325 MG tablet Take 2 tablets (650 mg total) by mouth every 6 (six) hours as needed for mild pain (or Fever >/= 101). 06/28/22   Elgergawy, Leana Roe, MD  allopurinol (ZYLOPRIM) 100 MG tablet Take 100 mg by mouth daily. 01/29/23   [provider]  aspirin EC 81 MG tablet Take 81 mg by mouth daily. Swallow whole.    [provider]  atenolol (TENORMIN) 25 MG tablet Take 25-50 mg by mouth See admin instructions. Take 50 mg (2 tablets) every morning and 25 mg (1 tablet) every evening.    [provider]  atorvastatin (LIPITOR) 20 MG tablet Take 20 mg by mouth at bedtime. 01/29/23   [provider]  budesonide-formoterol (SYMBICORT) 160-4.5 MCG/ACT inhaler Inhale 2 puffs into the lungs 2 (two) times daily as needed (wheezing, shortness of  breath).    [provider]  colchicine 0.6 MG tablet Take 1 tablet (0.6 mg total) by mouth daily. 05/25/23   [name], MD  cyclobenzaprine (FLEXERIL) 5 MG tablet Take 5 mg by mouth 3 (three) times daily as needed for muscle spasms. 06/03/22   [provider]  donepezil (ARICEPT) 10 MG tablet Take 10 mg by mouth at bedtime.    [provider]  gabapentin (NEURONTIN) 300 MG capsule Take 300 mg by mouth 3 (three) times daily.    [provider]  lurasidone (LATUDA) 40 MG TABS tablet Take 1 tablet (40 mg total) by mouth daily with breakfast. 06/16/22   Bobbye Morton, MD  mirtazapine (REMERON) 7.5 MG tablet Take 7.5 mg by mouth at bedtime.    [provider]  Multiple Vitamins-Minerals (MULTIVITAMIN WOMEN 50+) TABS Take 1 tablet by mouth daily.    [provider]  NIFEdipine (PROCARDIA XL/NIFEDICAL-XL) 90 MG 24 hr tablet Take 90 mg by mouth daily.    [provider]  polyethylene glycol powder (GLYCOLAX/MIRALAX) 17 GM/SCOOP powder Take 17 g by mouth daily as needed for mild constipation. 10/29/22   Sheikh, Omair [name], DO  TRESIBA FLEXTOUCH 200 UNIT/ML FlexTouch Pen Inject 30 Units into the skin in the morning and at bedtime. 02/26/23   [provider]      Allergies  Ace inhibitors, Latex, Ultram [tramadol], Zanaflex [tizanidine], Diprivan [propofol], and Metformin and related    Review of Systems   Review of Systems  All other systems reviewed and are negative.   Physical Exam Updated Vital Signs BP (!) 147/83   Pulse 64   Temp (!) 97.2 F (36.2 C)   Resp 15   Ht 1.626 m (5\' 4" )   SpO2 100%   BMI 37.01 kg/m  Physical Exam Vitals and nursing note reviewed.  Constitutional:      General: She is not in acute distress.    Appearance: She is well-developed.  HENT:     Head: Normocephalic and atraumatic.     Nose: No congestion or rhinorrhea.     Mouth/Throat:     Pharynx: No oropharyngeal exudate.  Eyes:      General: No scleral icterus.       Right eye: No discharge.        Left eye: No discharge.     Conjunctiva/sclera: Conjunctivae normal.     Pupils: Pupils are equal, round, and reactive to light.  Neck:     Thyroid: No thyromegaly.     Vascular: No JVD.  Cardiovascular:     Rate and Rhythm: Normal rate and regular rhythm.     Heart sounds: Normal heart sounds. No murmur heard.    No friction rub. No gallop.  Pulmonary:     Effort: Pulmonary effort is normal. No respiratory distress.     Breath sounds: Normal breath sounds. No wheezing or rales.  Abdominal:     General: Bowel sounds are normal. There is no distension.     Palpations: Abdomen is soft. There is no mass.     Tenderness: There is no abdominal tenderness.  Musculoskeletal:        General: No tenderness. Normal range of motion.     Cervical back: Normal range of motion and neck supple.     Right lower leg: Edema present.     Left lower leg: Edema present.  Lymphadenopathy:     Cervical: No cervical adenopathy.  Skin:    General: Skin is warm and dry.     Findings: No erythema or rash.     Comments: Open wounds both legs with edema bilaterally.  Neurological:     Mental Status: She is alert.     Coordination: Coordination normal.     Comments: Mild weakness of left hand compared to right, slight weakness of left leg compared to right, no obvious facial droop, the patient has an expressive aphasia, her tongue seems to be deviated to the left  Psychiatric:        Behavior: Behavior normal.     ED Results / Procedures / Treatments   Labs (all labs ordered are listed, but only abnormal results are displayed) Labs Reviewed  COMPREHENSIVE METABOLIC PANEL - Abnormal; Notable for the following components:      Result Value   CO2 20 (*)    Glucose, Bld 150 (*)    BUN 5 (*)    Total Protein 8.5 (*)    Albumin 3.1 (*)    All other components within normal limits  I-STAT CHEM 8, ED - Abnormal; Notable for the  following components:   BUN 5 (*)    Glucose, Bld 151 (*)    Calcium, Ion 1.13 (*)    All other components within normal limits  CBG MONITORING, ED - Abnormal; Notable for the following components:   Glucose-Capillary 152 (*)  All other components within normal limits  ETHANOL  PROTIME-INR  APTT  CBC  DIFFERENTIAL  RAPID URINE DRUG SCREEN, HOSP PERFORMED  URINALYSIS, ROUTINE W REFLEX MICROSCOPIC    EKG EKG Interpretation Date/Time:  Tuesday June 09 2023 08:50:06 EST Ventricular Rate:  77 PR Interval:  161 QRS Duration:  86 QT Interval:  415 QTC Calculation: 470 R Axis:   -1  Text Interpretation: Sinus rhythm Low voltage, precordial leads Confirmed by Eber Hong ([id number]) on 06/09/2023 8:58:38 AM  Radiology EEG adult Result Date: 06/09/2023 Charlsie Quest, MD     06/09/2023 11:36 AM Patient Name: [name] MRN: [id number] Epilepsy Attending: Charlsie Quest Referring Physician/Provider: Elmer Picker, NP Date: 06/09/2023 Duration: 26.19 mins Patient history: 72 year old female presented with aphasia.  EEG evaluate for seizure. Level of alertness: Awake AEDs during EEG study: Ativan Technical aspects: This EEG study was done with scalp electrodes positioned according to the 10-20 International system of electrode placement. Electrical activity was reviewed with band pass filter of 1-70Hz , sensitivity of 7 uV/mm, display speed of 63mm/sec with a 60Hz  notched filter applied as appropriate. EEG data were recorded continuously and digitally stored.  Video monitoring was available and reviewed as appropriate. Description: The posterior dominant rhythm consists of 9 Hz activity of moderate voltage (25-35 uV) seen predominantly in posterior head regions, symmetric and reactive to eye opening and eye closing. Physiologic photic driving was not seen during photic stimulation.  Hyperventilation was not performed Of note, parts of study were technically difficult due to significant  myogenic artifact. IMPRESSION: This technically difficult study is within normal limits. No seizures or epileptiform discharges were seen throughout the recording. A normal interictal EEG does not exclude the diagnosis of epilepsy. Charlsie Quest   CT HEAD CODE STROKE WO CONTRAST Result Date: 06/09/2023 CLINICAL DATA:  Code stroke. Neuro deficit, acute, stroke suspected. Aphasia. EXAM: CT HEAD WITHOUT CONTRAST TECHNIQUE: Contiguous axial images were obtained from the base of the skull through the vertex without intravenous contrast. RADIATION DOSE REDUCTION: This exam was performed according to the departmental dose-optimization program which includes automated exposure control, adjustment of the mA and/or kV according to patient size and/or use of iterative reconstruction technique. COMPARISON:  02/17/2023 FINDINGS: Brain: The study suffers from some motion degradation. 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 No new stroke identified. No mass, hemorrhage, hydrocephalus or extra-axial collection. Vascular: No abnormal vascular finding. Skull: Negative Sinuses/Orbits: Clear/normal Other: None ASPECTS (Alberta Stroke Program Early CT Score) - Ganglionic level infarction (caudate, lentiform nuclei, internal capsule, insula, M1-M3 cortex): 7, allowing for the old stroke. - Supraganglionic infarction (M4-M6 cortex): Three, allowing for the old stroke. Total score (0-10 with 10 being normal): 10, allowing for the old stroke. IMPRESSION: 1. No acute CT finding. 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 2. Aspects is 10, allowing for the old stroke. 3. These results were communicated to Dr. Otelia Limes at 9:33 am on 06/09/2023 by text page via the [facility] messaging system. Electronically Signed   By: Paulina Fusi M.D.   On: 06/09/2023 09:34    Procedures .Critical Care  Performed by: Eber Hong, MD Authorized by: Eber Hong, MD   Critical care provider statement:    Critical  care time (minutes):  45   Critical care time was exclusive of:  Separately billable procedures and treating other patients   Critical care was necessary to treat or prevent imminent or life-threatening deterioration of the following conditions:  CNS  failure or compromise   Critical care was time spent personally by me on the following activities:  Development of treatment plan with patient or surrogate, discussions with consultants, evaluation of patient's response to treatment, examination of patient, obtaining history from patient or surrogate, review of old charts, re-evaluation of patient's condition, pulse oximetry, ordering and review of radiographic studies, ordering and review of laboratory studies and ordering and performing treatments and interventions   I assumed direction of critical care for this patient from another provider in my specialty: no     Care discussed with: admitting provider   Comments:           Medications Ordered in ED Medications  LORazepam (ATIVAN) injection 2 mg (2 mg Intravenous Given 06/09/23 0935)    ED Course/ Medical Decision Making/ A&P                                 Medical Decision Making Amount and/or Complexity of Data Reviewed Labs: ordered. Radiology: ordered.  Risk Prescription drug management. Decision regarding hospitalization.    This patient presents to the ED for concern of expressive aphasia, this involves an extensive number of treatment options, and is a complaint that carries with it a high risk of complications and morbidity.  The differential diagnosis includes possible swelling of the tongue though I do not see any objective edema of the tongue at all.  It does appear to be deviated and she has some difficulty speaking raising concern for recurrent stroke   Co morbidities that complicate the patient evaluation  Prior stroke, hypertension, diabetes, high cholesterol   Additional history obtained:  Additional history  obtained from medical record External records from outside source obtained and reviewed including recent admission a couple of weeks ago for cellulitis of her leg, had been admitted in September for the same, history of bipolar affective disorder, history of hypokalemia, hypoglycemia and a prior stroke as recently as 1 year ago MRI from December 2023 showed an acute right MCA distribution infarct with the anterior right frontal lobe   Lab Tests:  I Ordered, and personally interpreted labs.  The pertinent results include: No acute findings, no hypoglycemia, no significant anemia   Imaging Studies ordered:  I ordered imaging studies including CT scans of the head I independently visualized and interpreted imaging which showed no acute infarctions or bleeding, MRI ordered by neurology I agree with the radiologist interpretation   Cardiac Monitoring: / EKG:  The patient was maintained on a cardiac monitor.  I personally viewed and interpreted the cardiac monitored which showed an underlying rhythm of: Sinus rhythm   Consultations Obtained:  I requested consultation with the neurohospitalist Dr. Otelia Limes as well as the hospitalist,  and discussed lab and imaging findings as well as pertinent plan - they recommend: EEG, MRI, admission to the hospital, hospitalist has been kind enough to admit   Problem List / ED Course / Critical interventions / Medication management  This patient presents with acute neurologic findings including an expressive aphasia which may or may not be related to an acute neurologic ischemic event.  She does not have any other medicines that would be causing this, her symptoms have not improved to any degree in the ER and thus neuro wants her admitted. I have reviewed the patients home medicines and have made adjustments as needed   Social Determinants of Health:  Elderly, prior stroke   Test /  Admission - Considered:  Admit to higher level of  care         Final Clinical Impression(s) / ED Diagnoses Final diagnoses:  Acute ischemic stroke [facility])    Rx / DC Orders ED Discharge Orders     None         Eber Hong, MD 06/09/23 1150

## 2023-06-09 NOTE — ED Notes (Signed)
Pt given food and beverage 

## 2023-06-09 NOTE — H&P (Signed)
History and Physical    Patient: [name] [name] DOB: 1951/04/14 DOA: 06/09/2023 DOS: the patient was seen and examined on 06/09/2023 PCP: Hillery Aldo, NP  Patient coming from: Home  Chief Complaint:  Chief Complaint  Patient presents with   Allergic Reaction   HPI: [name] is a 72 y.o. female with medical history significant of type 2 diabetes, TIA (2016), chronic lower back pain, depression, anxiety, HTN, HLD, OA presenting to [facility] ED via EMS as a Code Stroke.  Patient reports feeling in her usual state of health last night prior going to bed around 2300. This morning, around 0745, she woke up feeling like she could not talk, felt her tongue was very swollen to the point where she could not swallow her saliva, and experienced drooling. She woke her husband up but was unable to speak well. Husband states that her voice was abnormal this morning. They called EMS who brought patient to ED. EMS was concerned for medication reaction given tongue swelling and thus gave patient benadryl en route. Of note, patient reports not starting any new medications recently.   Patient does have a history of acute right MCA infarct in 05/2022.   ED course: Vital signs stable.  CBC unremarkable.  CMP with glucose 150, bicarb 20, normal kidney function.  aPTT, PT-INR normal.  Ethanol level negative.  CT head code stroke with no acute findings.  MRI brain did not show any evidence of acute intracranial abnormality though was intermittently motion degraded.  Spot EEG without any seizures or epileptiform discharges.  Neurology consulted.  During neurology evaluation after CT head, patient exhibited rhythmic contractions of left side of her face with worsening dysarthria and drooling from left side of her mouth which stopped after about 2 minutes.  She was given Ativan 2 mg IV.  [facility] hospitalist asked to evaluate patient for admission.  Review of Systems: As mentioned in the history of present  illness. All other systems reviewed and are negative. Past Medical History:  Diagnosis Date   Anxiety    Arthritis    "back, arms, legs" (03/24/2016)   Asthma    Chronic lower back pain    Colonic polyp    last colonoscopy done in 2009 with normal results per medical record   Depressive disorder    Gastric polyp    Gout    has taken allopurinol 300mg  once daily in past   Headache    History of hiatal hernia    Hypertension    Migraine    "none in awhile; might have a couple/year" (03/24/2016)   Mixed hyperlipidemia    01/2016 Total chol 141, HDL 59, LDL 63, ration 1.1   Osteoarthritis    TIA (transient ischemic attack) 11/2014   Type II diabetes mellitus (HCC)    Past Surgical History:  Procedure Laterality Date   ARTERY BIOPSY Right 08/12/2016   Procedure: BIOPSY TEMPORAL ARTERY;  Surgeon: Sherren Kerns, MD;  Location: [facility] OR;  Service: Vascular;  Laterality: Right;  BIOPSY TEMPORAL ARTERY   BREAST BIOPSY Left ~ 2015   benign   CARPAL TUNNEL RELEASE Bilateral    DILATION AND CURETTAGE OF UTERUS     I & D EXTREMITY Bilateral 10/24/2022   Procedure: DEBRIDEMENT LEFT LEG AND RIGHT ANKLE;  Surgeon: Nadara Mustard, MD;  Location: [facility] OR;  Service: Orthopedics;  Laterality: Bilateral;   IR CT HEAD LTD  06/11/2022   IR PERCUTANEOUS ART THROMBECTOMY/INFUSION INTRACRANIAL INC DIAG ANGIO  06/11/2022  IR US GUIDE VASC ACCESS RIGHT  06/11/2022   KNEE ARTHROSCOPY Right 2003   in [facility]   LAPAROSCOPIC CHOLECYSTECTOMY     LOOP RECORDER INSERTION N/A 06/26/2022   Procedure: LOOP RECORDER INSERTION;  Surgeon: Regan Lemming, MD;  Location: [facility] INVASIVE CV LAB;  Service: Cardiovascular;  Laterality: N/A;   RADIOLOGY WITH ANESTHESIA N/A 06/11/2022   Procedure: IR WITH ANESTHESIA;  Surgeon: Radiologist, Medication, MD;  Location: [facility] OR;  Service: Radiology;  Laterality: N/A;   TUBAL LIGATION     VAGINAL HYSTERECTOMY  1982   Social History:  reports that she has never smoked. She has never  used smokeless tobacco. She reports that she does not drink alcohol and does not use drugs.  Allergies  Allergen Reactions   Ace Inhibitors Swelling   Latex Itching and Swelling   Ultram [Tramadol] Nausea And Vomiting   Zanaflex [Tizanidine] Other (See Comments)    Tremors    Diprivan [Propofol] Itching   Metformin And Related Other (See Comments)    Tremors  Chills    Family History  Problem Relation Age of Onset   Stroke Father    Stroke Brother    Cancer Other    Gout Mother    Hypertension Mother    Arthritis Mother    Asthma Mother    Stroke Brother     Prior to Admission medications   Medication Sig Start Date End Date Taking? Authorizing Provider  acetaminophen (TYLENOL) 500 MG tablet Take 1,000 mg by mouth every 6 (six) hours as needed for moderate pain (pain score 4-6) or mild pain (pain score 1-3).   Yes [provider]  albuterol (VENTOLIN HFA) 108 (90 Base) MCG/ACT inhaler Inhale 2 puffs into the lungs every 6 (six) hours as needed for wheezing or shortness of breath.   Yes [provider]  allopurinol (ZYLOPRIM) 100 MG tablet Take 100 mg by mouth daily. 01/29/23  Yes [provider]  aspirin EC 81 MG tablet Take 81 mg by mouth daily. Swallow whole.   Yes [provider]  atenolol (TENORMIN) 25 MG tablet Take 25-50 mg by mouth See admin instructions. Take 50 mg (2 tablets) every morning and 25 mg (1 tablet) every evening.   Yes [provider]  budesonide-formoterol (SYMBICORT) 160-4.5 MCG/ACT inhaler Inhale 2 puffs into the lungs daily.   Yes [provider]  cyclobenzaprine (FLEXERIL) 5 MG tablet Take 5 mg by mouth 3 (three) times daily as needed for muscle spasms. 06/03/22  Yes [provider]  donepezil (ARICEPT) 10 MG tablet Take 10 mg by mouth at bedtime.   Yes [provider]  gabapentin (NEURONTIN) 300 MG capsule Take 300 mg by mouth daily.   Yes [provider]  lurasidone  (LATUDA) 40 MG TABS tablet Take 1 tablet (40 mg total) by mouth daily with breakfast. 06/16/22  Yes Bobbye Morton, MD  mirtazapine (REMERON) 7.5 MG tablet Take 7.5 mg by mouth at bedtime.   Yes [provider]  Multiple Vitamins-Minerals (MULTIVITAMIN WOMEN 50+) TABS Take 1 tablet by mouth at bedtime.   Yes [provider]  NIFEdipine (PROCARDIA XL/NIFEDICAL-XL) 90 MG 24 hr tablet Take 90 mg by mouth daily.   Yes [provider]  TRESIBA FLEXTOUCH 200 UNIT/ML FlexTouch Pen Inject 30 Units into the skin in the morning and at bedtime. 02/26/23  Yes [provider]  atorvastatin (LIPITOR) 20 MG tablet Take 20 mg by mouth at bedtime. Patient not taking: Reported on  06/09/2023 01/29/23   [provider]  cephALEXin (KEFLEX) 500 MG capsule Take 500 mg by mouth 4 (four) times daily. Patient not taking: Reported on 06/09/2023    [provider]  colchicine 0.6 MG tablet Take 1 tablet (0.6 mg total) by mouth daily. Patient not taking: Reported on 06/09/2023 05/25/23   [name], MD    Physical Exam: Vitals:   06/09/23 1300 06/09/23 1301 06/09/23 1301 06/09/23 1419  BP: (!) 142/81   129/69  Pulse: 68 71  69  Resp: 13 15  18   Temp:   (!) 97.3 F (36.3 C)   TempSrc:   Axillary   SpO2: 94% 98%  100%  Height:       General: obese female, laying in bed, NAD. HENT: [place]/AT, dry mucus membranes. Dried saliva on left corner of mouth. Eyes: PERRL, EOMI, no scleral icterus. CV: normal rate and regular rhythm, no m/r/g. Pulm: CTABL, no adventitious sounds noted. Abdomen: soft, nondistended, nontender to palpation, normal BS. MSK: 1+ edema in bilateral lower extremities Skin: open wounds on both lower extremities, appear chronic Neuro: AAOx3. Mild dysarthria present. Visual fields grossly intact, EOMI, no nystagmus noted. Symmetric facial sensation. Smile mildly weaker on left. Hearing intact to voice. Symmetric shoulder shrug and palatal elevation.  Leftward deviation on tongue protrusion. Strength 5/5 in RUE, RLE, and LLE. Strength 2/5 in LLE (chronic). Sensation intact bilaterally. Normal finger-nose testing. Gait deferred.   Data Reviewed:  There are no new results to review at this time.  MR BRAIN WO CONTRAST Result Date: 06/09/2023 CLINICAL DATA:  Provided history: Seizure, new onset, no history of trauma. EXAM: MRI HEAD WITHOUT CONTRAST TECHNIQUE: Multiplanar, multiecho pulse sequences of the brain and surrounding structures were obtained without intravenous contrast. COMPARISON:  Head CT 06/09/2023.  Brain MRI 10/25/2022. FINDINGS: Intermittently motion degraded examination (with up to moderate motion degradation of the acquired sequences). Within this limitation, findings are as follows. Brain: No age advanced or lobar predominant parenchymal atrophy. 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 Chronic hemosiderin deposition and cortical laminar necrosis at this site. Mild multifocal T2 FLAIR hyperintense signal abnormality elsewhere within the cerebral white matter and pons, nonspecific but compatible with chronic small vessel ischemic disease. No appreciable hippocampal size or signal asymmetry. There is no acute infarct. No evidence of an intracranial mass. No extra-axial fluid collection. No midline shift. Vascular: Maintained flow voids within the proximal large arterial vessels. Skull and upper cervical spine: No focal worrisome marrow lesion. Incompletely assessed cervical spondylosis. Sinuses/Orbits: No mass or acute finding within the imaged orbits. No significant paranasal sinus disease. Other: Small-volume fluid within the bilateral mastoid air cells. IMPRESSION: 1. Intermittently motion degraded exam. 2. No evidence of an acute intracranial abnormality. 3. Known chronic cortical/subcortical right MCA territory infarct within the right  frontoparietal operculum/right insula. 4. Background mild chronic small vessel ischemic changes within the cerebral white matter and pons, similar to the prior brain MRI of 10/25/2022. 5. Small-volume fluid within the bilateral mastoid air cells. Electronically Signed   By: Jackey Loge D.O.   On: 06/09/2023 12:52   EEG adult Result Date: 06/09/2023 Charlsie Quest, MD     06/09/2023 11:36 AM Patient Name: [name] MRN: [id number] Epilepsy Attending: Charlsie Quest Referring Physician/Provider: Elmer Picker, NP Date: 06/09/2023 Duration: 26.19 mins Patient history: 72 year old female presented with aphasia.  EEG evaluate for seizure. Level of alertness: Awake AEDs during EEG study: Ativan Technical  aspects: This EEG study was done with scalp electrodes positioned according to the 10-20 International system of electrode placement. Electrical activity was reviewed with band pass filter of 1-70Hz , sensitivity of 7 uV/mm, display speed of 87mm/sec with a 60Hz  notched filter applied as appropriate. EEG data were recorded continuously and digitally stored.  Video monitoring was available and reviewed as appropriate. Description: The posterior dominant rhythm consists of 9 Hz activity of moderate voltage (25-35 uV) seen predominantly in posterior head regions, symmetric and reactive to eye opening and eye closing. Physiologic photic driving was not seen during photic stimulation.  Hyperventilation was not performed Of note, parts of study were technically difficult due to significant myogenic artifact. IMPRESSION: This technically difficult study is within normal limits. No seizures or epileptiform discharges were seen throughout the recording. A normal interictal EEG does not exclude the diagnosis of epilepsy. Charlsie Quest   CT HEAD CODE STROKE WO CONTRAST Result Date: 06/09/2023 CLINICAL DATA:  Code stroke. Neuro deficit, acute, stroke suspected. Aphasia. EXAM: CT HEAD WITHOUT CONTRAST TECHNIQUE:  Contiguous axial images were obtained from the base of the skull through the vertex without intravenous contrast. RADIATION DOSE REDUCTION: This exam was performed according to the departmental dose-optimization program which includes automated exposure control, adjustment of the mA and/or kV according to patient size and/or use of iterative reconstruction technique. COMPARISON:  02/17/2023 FINDINGS: Brain: The study suffers from some motion degradation. 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 No new stroke identified. No mass, hemorrhage, hydrocephalus or extra-axial collection. Vascular: No abnormal vascular finding. Skull: Negative Sinuses/Orbits: Clear/normal Other: None ASPECTS (Alberta Stroke Program Early CT Score) - Ganglionic level infarction (caudate, lentiform nuclei, internal capsule, insula, M1-M3 cortex): 7, allowing for the old stroke. - Supraganglionic infarction (M4-M6 cortex): Three, allowing for the old stroke. Total score (0-10 with 10 being normal): 10, allowing for the old stroke. IMPRESSION: 1. No acute CT finding. 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 2. Aspects is 10, allowing for the old stroke. 3. These results were communicated to Dr. Otelia Limes at 9:33 am on 06/09/2023 by text page via the [facility] messaging system. Electronically Signed   By: Paulina Fusi M.D.   On: 06/09/2023 09:34    Assessment and Plan: No notes have been filed under this hospital service. Service: Hospitalist  Complex partial seizure Patient's presenting symptoms thought to be secondary to complex partial seizure secondary to a seizure focus in the right cerebral hemisphere, likely in the region of previous stroke.  Imaging did not reveal an acute stroke.  Neurology consulted and following.  She received IV Ativan 2 mg after exhibiting seizure-like symptoms in the ED after CT head (rhythmic contractions of left side of her face, worsening dysarthria and drooling of left side  of mouth).  Although Spot EEG was negative, will obtain long-term EEG for better evaluation.  Discussed with neurology, hold off on Keppra to increase chance of capturing a spell on long-term EEG.  If patient has an active seizure while on long-term EEG, administer 2 mg IV Ativan and call neurology. -Neurology following, appreciate assistance -Long-term EEG -Hold off on Keppra to increase chance of capturing a spell on long-term EEG -If seizure noted while on long-term EEG, administered 2 mg IV Ativan and call neurology  Uncontrolled type 2 diabetes with neuropathy Patient on Tresiba 30 units twice daily at home.  A1c 8.1% about 7 months ago.  Glucose 150 in the ED today. -Trend CBGs, goal 140-180 -  Moderate SSI -Semglee 15 units daily given she has not eaten yet today, can increase tomorrow if uncontrolled -gabapentin 300mg  daily  Hypertension -resume home atenolol and nidepine  Bilateral lower extremity open wounds, appear chronic -consulted wound care  History of CVA -Continue home aspirin and Lipitor  Asthma Stable. -continue home albuterol -continue dulera in place of home symbicort  Depression GAD -holding home lurasidone as patient is unclear whether she is still taking this medication   Advance Care Planning:   Code Status: Full Code   Consults: Neurology  Family Communication: updated family at bedside  Severity of Illness: The appropriate patient status for this patient is OBSERVATION. Observation status is judged to be reasonable and necessary in order to provide the required intensity of service to ensure the patient's safety. The patient's presenting symptoms, physical exam findings, and initial radiographic and laboratory data in the context of their medical condition is felt to place them at decreased risk for further clinical deterioration. Furthermore, it is anticipated that the patient will be medically stable for discharge from the hospital within 2 midnights  of admission.   Author: Briscoe Burns, MD 06/09/2023 3:37 PM  For on call review [email or web address].

## 2023-06-09 NOTE — Consult Note (Signed)
NEUROLOGY CONSULT NOTE   Date of service: June 09, 2023 Patient Name: [name] MRN:  [id number] DOB:  June 28, 1950 Chief Complaint: Tongue swelling with twitching of face and drooling.  Requesting Provider: Eber Hong, MD  History of Present Illness  [name] is a 72 y.o. female  has a past medical history of Anxiety, Arthritis, Asthma, Chronic lower back pain, Colonic polyp, Depressive disorder, Gastric polyp, Gout, Headache, History of hiatal hernia, Hypertension, Migraine, Mixed hyperlipidemia, Osteoarthritis, TIA (transient ischemic attack) (11/2014), and Type II diabetes mellitus (HCC)., who presents to the ED from home via EMS as a Code Stroke. LKN was 2300 yesterday night. EMS was called due to dystonia thought to be a reaction to a BP medicine, in addition to tongue swelling with twitching of face and drooling. EMS gave 50 mg of Benadryl. Symptoms were noticed at 0745 on awakening. She is not on any blood thinners.    Home medications include ASA and atorvastatin.    LKW: 2300 on Monday night. IV Thrombolysis:  No: Presentation most likely secondary to seizure. Risks of TNK significantly outweigh potential benefits.  EVT:  No: Presentation most likely secondary to seizure NIHSS: 6   ROS  As per HPI. Detailed ROS deferred due to acuity of presentation.   Past History   Past Medical History:  Diagnosis Date   Anxiety    Arthritis    "back, arms, legs" (03/24/2016)   Asthma    Chronic lower back pain    Colonic polyp    last colonoscopy done in 2009 with normal results per medical record   Depressive disorder    Gastric polyp    Gout    has taken allopurinol 300mg  once daily in past   Headache    History of hiatal hernia    Hypertension    Migraine    "none in awhile; might have a couple/year" (03/24/2016)   Mixed hyperlipidemia    01/2016 Total chol 141, HDL 59, LDL 63, ration 1.1   Osteoarthritis    TIA (transient ischemic attack) 11/2014   Type  II diabetes mellitus (HCC)     Past Surgical History:  Procedure Laterality Date   ARTERY BIOPSY Right 08/12/2016   Procedure: BIOPSY TEMPORAL ARTERY;  Surgeon: Sherren Kerns, MD;  Location: [facility] OR;  Service: Vascular;  Laterality: Right;  BIOPSY TEMPORAL ARTERY   BREAST BIOPSY Left ~ 2015   benign   CARPAL TUNNEL RELEASE Bilateral    DILATION AND CURETTAGE OF UTERUS     I & D EXTREMITY Bilateral 10/24/2022   Procedure: DEBRIDEMENT LEFT LEG AND RIGHT ANKLE;  Surgeon: Nadara Mustard, MD;  Location: [facility] OR;  Service: Orthopedics;  Laterality: Bilateral;   IR CT HEAD LTD  06/11/2022   IR PERCUTANEOUS ART THROMBECTOMY/INFUSION INTRACRANIAL INC DIAG ANGIO  06/11/2022   IR US GUIDE VASC ACCESS RIGHT  06/11/2022   KNEE ARTHROSCOPY Right 2003   in [facility]   LAPAROSCOPIC CHOLECYSTECTOMY     LOOP RECORDER INSERTION N/A 06/26/2022   Procedure: LOOP RECORDER INSERTION;  Surgeon: Regan Lemming, MD;  Location: [facility] INVASIVE CV LAB;  Service: Cardiovascular;  Laterality: N/A;   RADIOLOGY WITH ANESTHESIA N/A 06/11/2022   Procedure: IR WITH ANESTHESIA;  Surgeon: Radiologist, Medication, MD;  Location: [facility] OR;  Service: Radiology;  Laterality: N/A;   TUBAL LIGATION     VAGINAL HYSTERECTOMY  1982    Family History: Family History  Problem Relation Age of Onset   Stroke Father  Stroke Brother    Cancer Other    Gout Mother    Hypertension Mother    Arthritis Mother    Asthma Mother    Stroke Brother     Social History  reports that she has never smoked. She has never used smokeless tobacco. She reports that she does not drink alcohol and does not use drugs.  Allergies  Allergen Reactions   Ace Inhibitors Swelling   Latex Itching and Swelling   Ultram [Tramadol] Nausea And Vomiting   Zanaflex [Tizanidine] Other (See Comments)    Tremors    Diprivan [Propofol] Itching   Metformin And Related Other (See Comments)    Tremors  Chills    Medications  No current facility-administered  medications for this encounter.  Current Outpatient Medications:    acetaminophen (TYLENOL) 325 MG tablet, Take 2 tablets (650 mg total) by mouth every 6 (six) hours as needed for mild pain (or Fever >/= 101)., Disp: , Rfl:    allopurinol (ZYLOPRIM) 100 MG tablet, Take 100 mg by mouth daily., Disp: , Rfl:    aspirin EC 81 MG tablet, Take 81 mg by mouth daily. Swallow whole., Disp: , Rfl:    atenolol (TENORMIN) 25 MG tablet, Take 25-50 mg by mouth See admin instructions. Take 50 mg (2 tablets) every morning and 25 mg (1 tablet) every evening., Disp: , Rfl:    atorvastatin (LIPITOR) 20 MG tablet, Take 20 mg by mouth at bedtime., Disp: , Rfl:    budesonide-formoterol (SYMBICORT) 160-4.5 MCG/ACT inhaler, Inhale 2 puffs into the lungs 2 (two) times daily as needed (wheezing, shortness of breath)., Disp: , Rfl:    colchicine 0.6 MG tablet, Take 1 tablet (0.6 mg total) by mouth daily., Disp: 30 tablet, Rfl: 1   cyclobenzaprine (FLEXERIL) 5 MG tablet, Take 5 mg by mouth 3 (three) times daily as needed for muscle spasms., Disp: , Rfl:    donepezil (ARICEPT) 10 MG tablet, Take 10 mg by mouth at bedtime., Disp: , Rfl:    gabapentin (NEURONTIN) 300 MG capsule, Take 300 mg by mouth 3 (three) times daily., Disp: , Rfl:    lurasidone (LATUDA) 40 MG TABS tablet, Take 1 tablet (40 mg total) by mouth daily with breakfast., Disp: 30 tablet, Rfl: 1   mirtazapine (REMERON) 7.5 MG tablet, Take 7.5 mg by mouth at bedtime., Disp: , Rfl:    Multiple Vitamins-Minerals (MULTIVITAMIN WOMEN 50+) TABS, Take 1 tablet by mouth daily., Disp: , Rfl:    NIFEdipine (PROCARDIA XL/NIFEDICAL-XL) 90 MG 24 hr tablet, Take 90 mg by mouth daily., Disp: , Rfl:    polyethylene glycol powder (GLYCOLAX/MIRALAX) 17 GM/SCOOP powder, Take 17 g by mouth daily as needed for mild constipation., Disp: 238 g, Rfl: 0   TRESIBA FLEXTOUCH 200 UNIT/ML FlexTouch Pen, Inject 30 Units into the skin in the morning and at bedtime., Disp: , Rfl:   Vitals    Vitals:   06/09/23 0839 06/09/23 0846 06/09/23 0850 06/09/23 0900  BP:  (!) 153/82 (!) 153/82 127/70  Pulse:  77    Resp:  12 19 16   Temp:  (!) 97.2 F (36.2 C)    Height: 5\' 4"  (1.626 m)       Body mass index is 37.01 kg/m.  Physical Exam   Physical Exam  HEENT:  [place]/AT Lungs: Respirations unlabored Extremities: Warm and well perfused.   Neurological Examination Mental Status: Awake and alert. Oriented x 5. Speech fluent with intact naming and comprehension, but mild dysarthria is  present that waxes and wanes.  Cranial Nerves: II: Visual fields intact to all 4 quadrants of each eye bilaterally. Positive for left sided extinction to DSS.   III,IV, VI: No ptosis. EOMI. No nystagmus.  V: Temp sensation subjectively equal bilaterally VII: Smile mildly symmetric, weaker on the left VIII: Hearing intact to conversation IX,X: No hoarseness or hypophonia XI: Symmetric WGN:FAOZHYQM deviation on tongue extension Motor: RUE 5/5 LUE 4+/5 No drift of upper extremities.  RLE 5/5 LLE 2/5. Leg drifts to bed prior to 5 seconds. (Chronic LLE weakness from prior stroke) Sensory: Temp and light touch subjectively intact and equal to BUE, but with extinction on the left to DSS.  RLE with subjectively normal FT, temp and pinch sensation.  LLE with absent FT and temp sensation, prominently diminished pinch sensation Deep Tendon Reflexes: 2+ and symmetric bilateral brachioradialis. Trace right patellar, absent left patellar.  Cerebellar: No ataxia with FNF bilaterally. No ataxia with right H-S. Unable to perform H-S on the left.  Gait: Deferred Other: After CT head was completed, she started to exhibit rhythmic contractions of the left side of her face, worse periorally, with speech becoming garbled, worsened dysarthria and drooling from the left side of her mouth. Activity started abruptly and then stopped after about 2 minutes, without postictal confusion, but with residual mild left facial  droop and worsened dysarthria.   Labs/Imaging/Neurodiagnostic studies   CBC: No results for input(s): "WBC", "NEUTROABS", "HGB", "HCT", "MCV", "PLT" in the last 168 hours. Basic Metabolic Panel:  Lab Results  Component Value Date   NA 139 05/24/2023   K 3.8 05/24/2023   CO2 22 05/24/2023   GLUCOSE 97 05/24/2023   BUN 6 (L) 05/24/2023   CREATININE 0.78 05/24/2023   CALCIUM 8.8 (L) 05/24/2023   GFRNONAA >60 05/24/2023   GFRAA >60 11/18/2019   Lipid Panel:  Lab Results  Component Value Date   LDLCALC 69 10/25/2022   HgbA1c:  Lab Results  Component Value Date   HGBA1C 8.1 (H) 10/23/2022   Urine Drug Screen: No results found for: "LABOPIA", "COCAINSCRNUR", "LABBENZ", "AMPHETMU", "THCU", "LABBARB"  Alcohol Level     Component Value Date/Time   ETH <10 06/11/2022 1240   INR  Lab Results  Component Value Date   INR 1.2 06/11/2022   APTT  Lab Results  Component Value Date   APTT 31 06/11/2022     ASSESSMENT  72 y.o. female  has a past medical history of Anxiety, Arthritis, Asthma, Chronic lower back pain, Colonic polyp, Depressive disorder, Gastric polyp, Gout, Headache, History of hiatal hernia, Hypertension, Migraine, Mixed hyperlipidemia, Osteoarthritis, TIA (transient ischemic attack) (11/2014), and Type II diabetes mellitus (HCC)., who presents to the ED from home via EMS as a Code Stroke. LKN was 2300 yesterday night. EMS was called due to dystonia thought to be a reaction to a BP medicine, in addition to tongue swelling with twitching of face and drooling. EMS gave 50 mg of Benadryl. Symptoms were noticed at 0745 on awakening. She is not on any blood thinners.   - Exam reveals disorientation (wrong month), weak left leg, dysarthria and extinction of the left to double simultaneous stimulation (vision and touch) with NIHSS of 6. After CT head was completed, she started to exhibit rhythmic contractions of the left side of her face, worse periorally, with speech becoming  garbled, worsened dysarthria and drooling from the left side of her mouth. Activity started abruptly and then stopped after about 2 minutes, without postictal confusion, but with  residual mild left facial droop and worsened dysarthria. Ativan 2 mg IV was administered.  - CT head: Old infarction in the right insular region with volume loss and gliosis. No acute abnormalities.  - Overall presentation is most consistent with complex partial seizure secondary to a seizure focus in the right cerebral hemisphere, most likely adjacent or near to the old cortically based right hemisphere region of post-stroke encephalomalacia seen on CT.  - IV Thrombolysis:  No: Presentation most likely secondary to seizure. Risks of TNK significantly outweigh potential benefits.  - EVT:  No: Presentation most likely secondary to seizure   RECOMMENDATIONS  - STAT EEG - MRI brain - Hold off on starting an anticonvulsant for now to increase the likelihood of finding a seizure focus on EEG - Inpatient seizure precautions.  - Outpatient seizure precautions: Per [facility] statutes, patients with seizures are not allowed to drive until  they have been seizure-free for six months. Use caution when using heavy equipment or power tools. Avoid working on ladders or at heights. Take showers instead of baths. Ensure the water temperature is not too high on the home water heater. Do not go swimming alone. When caring for infants or small children, sit down when holding, feeding, or changing them to minimize risk of injury to the child in the event you have a seizure. Also, Maintain good sleep hygiene. Avoid alcohol.  Addendum: MRI brain: No evidence of an acute intracranial abnormality. Known chronic cortical/subcortical right MCA territory infarct within the right frontoparietal operculum/right insula. Background mild chronic small vessel ischemic changes within the cerebral white matter and pons, similar to the prior brain MRI  of 10/25/2022.   Addendum: - EEG is within normal limits - Obtaining LTM EEG - Continue to hold off on Keppra to increase chance of capturing a spell on LTM EEG - If she does have a seizure while on LTM EEG, administer 2 mg IV Ativan and call Neurology ______________________________________________________________________    Dessa Phi, Harriette Tovey, MD [facility]

## 2023-06-10 DIAGNOSIS — I10 Essential (primary) hypertension: Secondary | ICD-10-CM | POA: Diagnosis present

## 2023-06-10 DIAGNOSIS — R569 Unspecified convulsions: Secondary | ICD-10-CM

## 2023-06-10 DIAGNOSIS — Z860100 Personal history of colon polyps, unspecified: Secondary | ICD-10-CM | POA: Diagnosis not present

## 2023-06-10 DIAGNOSIS — G40209 Localization-related (focal) (partial) symptomatic epilepsy and epileptic syndromes with complex partial seizures, not intractable, without status epilepticus: Secondary | ICD-10-CM | POA: Diagnosis present

## 2023-06-10 DIAGNOSIS — Z91040 Latex allergy status: Secondary | ICD-10-CM | POA: Diagnosis not present

## 2023-06-10 DIAGNOSIS — Z8261 Family history of arthritis: Secondary | ICD-10-CM | POA: Diagnosis not present

## 2023-06-10 DIAGNOSIS — E1165 Type 2 diabetes mellitus with hyperglycemia: Secondary | ICD-10-CM | POA: Diagnosis present

## 2023-06-10 DIAGNOSIS — I639 Cerebral infarction, unspecified: Secondary | ICD-10-CM

## 2023-06-10 DIAGNOSIS — E782 Mixed hyperlipidemia: Secondary | ICD-10-CM | POA: Diagnosis present

## 2023-06-10 DIAGNOSIS — F32A Depression, unspecified: Secondary | ICD-10-CM | POA: Diagnosis present

## 2023-06-10 DIAGNOSIS — G40909 Epilepsy, unspecified, not intractable, without status epilepticus: Secondary | ICD-10-CM | POA: Diagnosis not present

## 2023-06-10 DIAGNOSIS — Z8249 Family history of ischemic heart disease and other diseases of the circulatory system: Secondary | ICD-10-CM | POA: Diagnosis not present

## 2023-06-10 DIAGNOSIS — R4701 Aphasia: Secondary | ICD-10-CM | POA: Diagnosis present

## 2023-06-10 DIAGNOSIS — Z7982 Long term (current) use of aspirin: Secondary | ICD-10-CM | POA: Diagnosis not present

## 2023-06-10 DIAGNOSIS — F411 Generalized anxiety disorder: Secondary | ICD-10-CM | POA: Diagnosis present

## 2023-06-10 DIAGNOSIS — R471 Dysarthria and anarthria: Secondary | ICD-10-CM | POA: Diagnosis present

## 2023-06-10 DIAGNOSIS — Z825 Family history of asthma and other chronic lower respiratory diseases: Secondary | ICD-10-CM | POA: Diagnosis not present

## 2023-06-10 DIAGNOSIS — I69334 Monoplegia of upper limb following cerebral infarction affecting left non-dominant side: Secondary | ICD-10-CM | POA: Diagnosis not present

## 2023-06-10 DIAGNOSIS — Z79899 Other long term (current) drug therapy: Secondary | ICD-10-CM | POA: Diagnosis not present

## 2023-06-10 DIAGNOSIS — J45909 Unspecified asthma, uncomplicated: Secondary | ICD-10-CM | POA: Diagnosis present

## 2023-06-10 DIAGNOSIS — Z888 Allergy status to other drugs, medicaments and biological substances status: Secondary | ICD-10-CM | POA: Diagnosis not present

## 2023-06-10 DIAGNOSIS — R2990 Unspecified symptoms and signs involving the nervous system: Secondary | ICD-10-CM | POA: Diagnosis not present

## 2023-06-10 DIAGNOSIS — Z9049 Acquired absence of other specified parts of digestive tract: Secondary | ICD-10-CM | POA: Diagnosis not present

## 2023-06-10 DIAGNOSIS — Z794 Long term (current) use of insulin: Secondary | ICD-10-CM | POA: Diagnosis not present

## 2023-06-10 DIAGNOSIS — E1140 Type 2 diabetes mellitus with diabetic neuropathy, unspecified: Secondary | ICD-10-CM | POA: Diagnosis present

## 2023-06-10 DIAGNOSIS — Z823 Family history of stroke: Secondary | ICD-10-CM | POA: Diagnosis not present

## 2023-06-10 DIAGNOSIS — Z90710 Acquired absence of both cervix and uterus: Secondary | ICD-10-CM | POA: Diagnosis not present

## 2023-06-10 LAB — CBC
HCT: 36.8 % (ref 36.0–46.0)
Hemoglobin: 11.7 g/dL — ABNORMAL LOW (ref 12.0–15.0)
MCH: 28.8 pg (ref 26.0–34.0)
MCHC: 31.8 g/dL (ref 30.0–36.0)
MCV: 90.6 fL (ref 80.0–100.0)
Platelets: 240 K/uL (ref 150–400)
RBC: 4.06 MIL/uL (ref 3.87–5.11)
RDW: 15.3 % (ref 11.5–15.5)
WBC: 5.5 K/uL (ref 4.0–10.5)
nRBC: 0 % (ref 0.0–0.2)

## 2023-06-10 LAB — BASIC METABOLIC PANEL
Anion gap: 11 (ref 5–15)
BUN: 6 mg/dL — ABNORMAL LOW (ref 8–23)
CO2: 18 mmol/L — ABNORMAL LOW (ref 22–32)
Calcium: 9.3 mg/dL (ref 8.9–10.3)
Chloride: 110 mmol/L (ref 98–111)
Creatinine, Ser: 0.83 mg/dL (ref 0.44–1.00)
GFR, Estimated: >60 mL/min (ref >60)
Glucose, Bld: 123 mg/dL — ABNORMAL HIGH (ref 70–99)
Potassium: 3.7 mmol/L (ref 3.5–5.1)
Sodium: 139 mmol/L (ref 135–145)

## 2023-06-10 LAB — GLUCOSE, CAPILLARY
Glucose-Capillary: 106 mg/dL — ABNORMAL HIGH (ref 70–99)
Glucose-Capillary: 147 mg/dL — ABNORMAL HIGH (ref 70–99)
Glucose-Capillary: 79 mg/dL (ref 70–99)
Glucose-Capillary: 97 mg/dL (ref 70–99)

## 2023-06-10 LAB — HEMOGLOBIN A1C
Hgb A1c MFr Bld: 6.4 % — ABNORMAL HIGH (ref 4.8–5.6)
Mean Plasma Glucose: 137 mg/dL

## 2023-06-10 MED ORDER — LORAZEPAM 2 MG/ML IJ SOLN
2.0000 mg | Freq: Once | INTRAMUSCULAR | Status: AC
  Administered 2023-06-10: 2 mg via INTRAVENOUS

## 2023-06-10 MED ORDER — LORAZEPAM 2 MG/ML IJ SOLN
INTRAMUSCULAR | Status: AC
  Filled 2023-06-10: qty 1

## 2023-06-10 MED ORDER — LEVETIRACETAM IN NACL 1000 MG/100ML IV SOLN
1000.0000 mg | Freq: Once | INTRAVENOUS | Status: AC
  Administered 2023-06-10: 1000 mg via INTRAVENOUS
  Filled 2023-06-10: qty 100

## 2023-06-10 MED ORDER — LEVETIRACETAM 500 MG PO TABS
500.0000 mg | ORAL_TABLET | Freq: Two times a day (BID) | ORAL | Status: DC
  Administered 2023-06-10 – 2023-06-11 (×3): 500 mg via ORAL
  Filled 2023-06-10 (×3): qty 1

## 2023-06-10 MED ORDER — LORAZEPAM 2 MG/ML IJ SOLN: 2.0000 mg | Freq: Four times a day (QID) | INTRAMUSCULAR | Status: DC | PRN

## 2023-06-10 NOTE — Progress Notes (Signed)
Progress Note   Patient: [name] UJW:[id number] DOB: November 25, 1950 DOA: 06/09/2023     0 DOS: the patient was seen and examined on 06/10/2023   Brief hospital course: 72 y.o. female with medical history significant of type 2 diabetes, TIA (2016), chronic lower back pain, depression, anxiety, HTN, HLD, OA presenting to [facility] ED via EMS as a Code Stroke.   Patient reports feeling in her usual state of health last night prior going to bed around 2300. This morning, around 0745, she woke up feeling like she could not talk, felt her tongue was very swollen to the point where she could not swallow her saliva, and experienced drooling. She woke her husband up but was unable to speak well. Husband states that her voice was abnormal this morning. They called EMS who brought patient to ED. EMS was concerned for medication reaction given tongue swelling and thus gave patient benadryl en route. Of note, patient reports not starting any new medications recently.    Patient does have a history of acute right MCA infarct in 05/2022.    ED course: Vital signs stable.  CBC unremarkable.  CMP with glucose 150, bicarb 20, normal kidney function.  aPTT, PT-INR normal.  Ethanol level negative.  CT head code stroke with no acute findings.  MRI brain did not show any evidence of acute intracranial abnormality though was intermittently motion degraded.  Spot EEG without any seizures or epileptiform discharges.  Neurology consulted.  During neurology evaluation after CT head, patient exhibited rhythmic contractions of left side of her face with worsening dysarthria and drooling from left side of her mouth which stopped after about 2 minutes.  She was given Ativan 2 mg IV.  [facility] asked to evaluate patient for admission.  Assessment and Plan: Complex partial seizure -Neurology following, appreciate assistance -Long-term EEG with event button pressed this AM without concomitant EEG change. However the semiology  of episode with her R MCA infarct can be concerning for focal motor seizure which may not be seen on scalp EEG -will f/u with Neurology recs   Uncontrolled type 2 diabetes with neuropathy Patient on Tresiba 30 units twice daily at home.  A1c 8.1% about 7 months ago.  Glucose 150 in the ED today. -Trend CBGs, goal 140-180 -Cont SSI as needed -On Semglee 15 units daily  -gabapentin 300mg  daily   Hypertension -resume home atenolol and nidepine   Bilateral lower extremity open wounds, appear chronic -consulted wound care   History of CVA -Continue home aspirin and Lipitor   Asthma Stable. -continue home albuterol -continue dulera in place of home symbicort   Depression GAD -home lurasidone was held as patient is unclear whether she is still taking this medication   Subjective: Denies sob. Tolerating PO  Physical Exam: Vitals:   06/10/23 0809 06/10/23 0841 06/10/23 1241 06/10/23 1516  BP: 127/76  (!) 123/58 119/62  Pulse: 70  69 64  Resp: 16  17 17   Temp: 98.3 F (36.8 C)  98.1 F (36.7 C) 98.3 F (36.8 C)  TempSrc: Oral  Oral Oral  SpO2: 100% 95% 100% 98%  Weight:      Height:       General exam: Awake, laying in bed, in nad Respiratory system: Normal respiratory effort, no wheezing Cardiovascular system: regular rate, s1, s2 Gastrointestinal system: Soft, nondistended, positive BS Central nervous system: CN2-12 grossly intact, strength intact Extremities: Perfused, no clubbing Skin: Normal skin turgor, no notable skin lesions seen Psychiatry: Mood normal //  no visual hallucinations   Data Reviewed:  Labs reviewed: Na 139, K 3.7, Cr 0.83, WBC 5.5, Hgb 11.7  Family Communication: Pt in room, family not at bedside  Disposition: Status is: Observation The patient will require care spanning > 2 midnights and should be moved to inpatient because: severity of illness  Planned Discharge Destination: Home     Author: Rickey Barbara, MD 06/10/2023 5:16 PM  For  on call review [email or web address].

## 2023-06-10 NOTE — Consult Note (Signed)
WOC Nurse Consult Note: this patient is well known to [facility] team from previous admissions; has longstanding history of lymphedema and venous ulcerations; patient is followed by Dr. Burna Sis office and was last seen 06/03/2023 at which time a dry dressing and Ace bandage was ordered.  Reason for Consult: leg wounds  Wound type: full thickness chronic ulcerations r/t lymphedema and venous insufficiency to bilateral lower legs and dorsal foot  Pressure Injury POA: NA  Measurement: see nursing flowsheet  Wound bed: patchy areas of yellow moist wounds with scattered pink scar tissue from healed areas Drainage (amount, consistency, odor)  see nursing flowsheet  Periwound: edema and changes consistent with venous stasis  Dressing procedure/placement/frequency: Cleanse bilateral lower legs with soap and water, clean any open wounds with NS, apply Xeroform gauze [place] [phone]) to anterior and posterior lower legs, cover with ABD pads and wrap in Kerlix roll gauze beginning above toes and ending right below knees.  Secure with Ace bandage wrapped in same fashion as Kerlix for light compression. Patient should elevate legs as much as possible.   Patient should continue outpatient follow-up with Dr. Burna Sis office at discharge.    WOC team will not follow.  Re-consult if further needs arise.   Thank you,    Priscella Mann MSN, RN-BC, [employer] [phone]

## 2023-06-10 NOTE — Procedures (Addendum)
Patient Name: [name]  MRN: [id number]  Epilepsy Attending: Charlsie Quest  Referring Physician/Provider: Caryl Pina, MD  Duration: 06/09/2023 1417 to 06/10/2023 1017  Patient history:  72 year old female presented with aphasia.  EEG evaluate for seizure.   Level of alertness: Awake, asleep  AEDs during EEG study: None  Technical aspects: This EEG study was done with scalp electrodes positioned according to the 10-20 International system of electrode placement. Electrical activity was reviewed with band pass filter of 1-70Hz , sensitivity of 7 uV/mm, display speed of 43mm/sec with a 60Hz  notched filter applied as appropriate. EEG data were recorded continuously and digitally stored.  Video monitoring was available and reviewed as appropriate.  Description: The posterior dominant rhythm consists of 9 Hz activity of moderate voltage (25-35 uV) seen predominantly in posterior head regions, symmetric and reactive to eye opening and eye closing.  Sleep was characterized by vertex waves, sleep spindles (12 to 14 Hz), maximal frontocentral region. Hyperventilation and photic stimulation were not performed.     Event button was pressed on 06/10/2023 at 0813 for left sided facial twitching, left arm contortions, transient confusion and dysarthria followed by a few minutes of being unable to move her left arm. Concomitant eeg before, during and after the event didn't show any eeg change.  EEG was disconnected between 06/09/2023 1842 to 0419 due to patient transport  IMPRESSION: This study is within normal limits. No seizures or epileptiform discharges were seen throughout the recording.  Event button was pressed on 06/10/2023 at 0813 as described above without concomitant eeg change. However, the semiology of episode in a patient with right MCA infarct is concerning for focal motor seizure which may not be seen on scalp EEG. Clinical correlation is recommended.  A normal interictal EEG does  not exclude the diagnosis of epilepsy.   [name]

## 2023-06-10 NOTE — Progress Notes (Signed)
EEG machine found outside of PT room, RN's did not know how it got there. I was told wires where on pt head, machine had been sitting outside room since change of shift. PC moved into pt room, eeg running, [employer]

## 2023-06-10 NOTE — Progress Notes (Signed)
EEG Removal no skin breakdown

## 2023-06-10 NOTE — Progress Notes (Signed)
   06/10/23 0410  Neurological  Motor Function/Sensation Assessment Sensation  Facial Symmetry  (pt still c/o numbnees "feeling different" on left side of face)  NIH Stroke Scale   Dizziness Present No  Headache Present No  Interval Other (Comment) (time for q 4 NIHSS but pt combative and refusing to follow commands to properly test.)  Neurological  Level of Consciousness Alert     Pt woke up stating her bed was wet, but she would not let the staff help her.  She was verbally abusive to everyone that came in the room, and she called her husband to "call the police".  This aggressive behavior continued for a bit, and she would not let us do her NIHSS.  She later apologized and let the staff change her bed and take her vitals, which were stable.  Unsure if this labile mood is her baseline, but day shift staff did note she was cursing at them, as well.

## 2023-06-10 NOTE — Care Management Obs Status (Signed)
MEDICARE OBSERVATION STATUS NOTIFICATION   Patient Details  Name: [name] MRN: [id number] Date of Birth: 15-Jul-1950   Medicare Observation Status Notification Given:  Yes    Baldemar Lenis, LCSW 06/10/2023, 12:59 PM

## 2023-06-10 NOTE — Progress Notes (Signed)
NEUROLOGY CONSULT FOLLOW UP NOTE   Date of service: June 10, 2023 Patient Name: [name] MRN:  [id number] DOB:  1951-05-17  Brief HPI  [name] is a 72 y.o. female  has a past medical history of Anxiety, Arthritis, Asthma, Chronic lower back pain, Colonic polyp, Depressive disorder, Gastric polyp, Gout, Headache, History of hiatal hernia, Hypertension, Migraine, Mixed hyperlipidemia, Osteoarthritis, TIA (transient ischemic attack) (11/2014), and Type II diabetes mellitus (HCC). who presented with tongue swelling, twitching of face and drooling.  She had what appeared to be a seizure episode while in [place] with left facial twitching, garbled speech, dysarthria, drooling from the left side of her mouth with residual left facial droop and dysarthria.  Patient has been on continuous EEG and states she had an episode overnight, but unfortunately EEG was not connected at that time.  Patient states that she has had seizure episodes in the past where she has left-sided twitching and subjective disorientation.  She said she has had 1 bad head injury after a fall but did not seek medical care for it.  She has never had meningitis or encephalitis.  Nobody in her family has seizures, she never had febrile seizures as a child, and she did not have any problems with birth or early development.   Interval Hx/subjective   Patient has been hemodynamically stable and afebrile overnight.  She did have one of her episodes overnight but it unfortunately was not caught on EEG.  She also had an episode of agitation.  Vitals   Vitals:   06/09/23 2103 06/09/23 2354 06/10/23 0410 06/10/23 0809  BP: 122/73 127/61 130/74 127/76  Pulse: 84 79 80 70  Resp: 18 18 20 16   Temp: 98.6 F (37 C) 99.4 F (37.4 C)  98.3 F (36.8 C)  TempSrc: Oral Oral  Oral  SpO2: 99% 95% 100% 100%  Weight:      Height:         Body mass index is 36.74 kg/m.  Physical Exam   Constitutional: Appears well-developed and  well-nourished.  Psych: Affect appropriate to situation.  Eyes: No scleral injection.  HENT: No OP obstrucion.  Head: Normocephalic.  Respiratory: Effort normal, non-labored breathing.  Skin: WDI.   Neurologic Examination    NEURO:  Mental Status: Alert and oriented to person place time and situation, able to give clear and coherent history of present illness Speech/Language: speech is without dysarthria or aphasia.    Cranial Nerves:  II: PERRL.  III, IV, VI: EOMI. Eyelids elevate symmetrically.  V: Sensation is intact to light touch and symmetrical to face.  VII: Smile is symmetrical.  VIII: hearing intact to voice. IX, X: Phonation is normal.  XII: tongue is midline without fasciculations. Motor: Able to move bilateral upper extremities with good antigravity strength, able to move bilateral lower extremities off the bed but strength is weak Tone: is normal and bulk is normal Sensation- Intact to light touch bilaterally.  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    Medications  Current Facility-Administered Medications:    acetaminophen (TYLENOL) tablet 650 mg, 650 mg, Oral, Q6H PRN **OR** acetaminophen (TYLENOL) suppository 650 mg, 650 mg, Rectal, Q6H PRN, Austin Miles, Sagar H, MD   albuterol (PROVENTIL) (2.5 MG/3ML) 0.083% nebulizer solution 3 mL, 3 mL,  Inhalation, Q6H PRN, Briscoe Burns, MD   aspirin EC tablet 81 mg, 81 mg, Oral, Daily, Briscoe Burns, MD, 81 mg at 06/09/23 1455   atenolol (TENORMIN) tablet 50 mg, 50 mg, Oral, q morning **AND** atenolol (TENORMIN) tablet 25 mg, 25 mg, Oral, QHS, Jinwala, Sagar H, MD, 25 mg at 06/09/23 2033   atorvastatin (LIPITOR) tablet 20 mg, 20 mg, Oral, QHS, Jinwala, Sagar H, MD, 20 mg at 06/09/23  2032   enoxaparin (LOVENOX) injection 40 mg, 40 mg, Subcutaneous, Q24H, Briscoe Burns, MD, 40 mg at 06/09/23 2034   gabapentin (NEURONTIN) capsule 300 mg, 300 mg, Oral, Daily, Jinwala, Sagar H, MD   insulin aspart (novoLOG) injection 0-15 Units, 0-15 Units, Subcutaneous, TID WC, Briscoe Burns, MD, 2 Units at 06/09/23 1759   insulin glargine-yfgn (SEMGLEE) injection 15 Units, 15 Units, Subcutaneous, Daily, Briscoe Burns, MD, 15 Units at 06/09/23 1455   LORazepam (ATIVAN) 2 MG/ML injection, , , ,    mometasone-formoterol (DULERA) 200-5 MCG/ACT inhaler 2 puff, 2 puff, Inhalation, BID, Briscoe Burns, MD   NIFEdipine (PROCARDIA-XL/NIFEDICAL-XL) 24 hr tablet 90 mg, 90 mg, Oral, Daily, Briscoe Burns, MD  Labs and Diagnostic Imaging   CBC:  Recent Labs  Lab 06/09/23 0851 06/09/23 0907 06/10/23 0554  WBC 6.8  --  5.5  NEUTROABS 4.3  --   --   HGB 12.3 12.9 11.7*  HCT 39.1 38.0 36.8  MCV 91.1  --  90.6  PLT 335  --  240    Basic Metabolic Panel:  Lab Results  Component Value Date   NA 139 06/10/2023   K 3.7 06/10/2023   CO2 18 (L) 06/10/2023   GLUCOSE 123 (H) 06/10/2023   BUN 6 (L) 06/10/2023   CREATININE 0.83 06/10/2023   CALCIUM 9.3 06/10/2023   GFRNONAA >60 06/10/2023   GFRAA >60 11/18/2019   Lipid Panel:  Lab Results  Component Value Date   LDLCALC 69 10/25/2022   HgbA1c:  Lab Results  Component Value Date   HGBA1C 6.4 (H) 06/09/2023   Urine Drug Screen:     Component Value Date/Time   LABOPIA NONE DETECTED 06/09/2023 1618   COCAINSCRNUR NONE DETECTED 06/09/2023 1618   LABBENZ NONE DETECTED 06/09/2023 1618   AMPHETMU NONE DETECTED 06/09/2023 1618   THCU NONE DETECTED 06/09/2023 1618   LABBARB NONE DETECTED 06/09/2023 1618    Alcohol Level     Component Value Date/Time   ETH <10 06/09/2023 0900   INR  Lab Results  Component Value Date   INR 1.0 06/09/2023   APTT  Lab Results  Component Value Date   APTT 32 06/09/2023    CT head: No  acute abnormality  MRI Brain(Personally reviewed): No acute abnormality, chronic infarct in the right MCA territory, chronic small vessel ischemic changes  rEEG 12/25:  No seizures or epileptiform discharges.  No seizure activity or EEG changes seen during episode around 8 AM on 12/25  Assessment  [name] is a 72 y.o. female with history of right MCA stroke, depression, hiatal hernia, hypertension, migraines, hyperlipidemia, TIA and diabetes who presented with left facial twitching and drooling as well as dystonia.  She was noted to have some seizure-like activity consisting of left facial twitching, garbled speech, dysarthria and drooling from the left side of her mouth while in the CT scanner.  This terminated with Ativan.  Patient states that she has had these episodes while at home and believes that they are seizures.  -  She was placed on long-term EEG and had an episode overnight, but unfortunately EEG was disconnected at that time.  During exam this morning (Wednesday), she suddenly had another episode of left-sided facial twitching, tongue protrusion, dysarthria, transient disorientation and contracture of the left arm.  This episode lasted about 5 minutes, was terminated with lorazepam administration, and was followed by transient inability to move the left arm for about 2 to 3 minutes.  No changes were seen on EEG, but focal motor seizures may not be picked up on scalp EEG.   - LTM EEG (06/09/2023 1417 to 06/10/2023 0730): This study is within normal limits. No seizures or epileptiform discharges were seen throughout the recording. Event button was pressed on 06/10/2023 at 0813 without concomitant EEG change. However, the semiology of episode in a patient with right MCA infarct is concerning for focal motor seizure which may not be seen on scalp EEG.  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 Seizure focus is most likely in the right hemisphere adjacent or near to her old cortically-based stroke.  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    Recommendations  -Can discontinue long-term EEG -Initiate Keppra 500 mg po twice daily. Also loading with 1000 mg IV. -For further seizure activity administer 2 mg of Ativan and call neurology -Continue seizure precautions inpatient and outpatient. Discussed [facility] statutes, patients with seizures are not allowed to drive until they have been seizure-free for six months. Use caution when using heavy equipment or power tools. Avoid working on ladders or at heights. Take showers instead of baths. Ensure the water temperature is not too high on the home water heater. Do not go swimming alone. Do not lock yourself in a room alone (i.e. bathroom). When caring for infants or small children, sit down when holding, feeding, or changing them to minimize risk of injury to the child in the event you have a seizure. Maintain good sleep hygiene. Avoid alcohol.   - If no further seizures after initiation of Keppra, can be discharged home with outpatient Neurology follow up at [facility] or [facility].  ______________________________________________________________________   Signed, Cortney Harland Dingwall, NP [facility]   Electronically signed: Dr. Caryl Pina

## 2023-06-10 NOTE — Hospital Course (Signed)
72 y.o. female with medical history significant of type 2 diabetes, TIA (2016), chronic lower back pain, depression, anxiety, HTN, HLD, OA presenting to [facility] ED via EMS as a Code Stroke.   Patient reports feeling in her usual state of health last night prior going to bed around 2300. This morning, around 0745, she woke up feeling like she could not talk, felt her tongue was very swollen to the point where she could not swallow her saliva, and experienced drooling. She woke her husband up but was unable to speak well. Husband states that her voice was abnormal this morning. They called EMS who brought patient to ED. EMS was concerned for medication reaction given tongue swelling and thus gave patient benadryl en route. Of note, patient reports not starting any new medications recently.    Patient does have a history of acute right MCA infarct in 05/2022.    ED course: Vital signs stable.  CBC unremarkable.  CMP with glucose 150, bicarb 20, normal kidney function.  aPTT, PT-INR normal.  Ethanol level negative.  CT head code stroke with no acute findings.  MRI brain did not show any evidence of acute intracranial abnormality though was intermittently motion degraded.  Spot EEG without any seizures or epileptiform discharges.  Neurology consulted.  During neurology evaluation after CT head, patient exhibited rhythmic contractions of left side of her face with worsening dysarthria and drooling from left side of her mouth which stopped after about 2 minutes.  She was given Ativan 2 mg IV.  [facility] asked to evaluate patient for admission.

## 2023-06-11 ENCOUNTER — Other Ambulatory Visit (HOSPITAL_COMMUNITY): Payer: Self-pay

## 2023-06-11 DIAGNOSIS — I639 Cerebral infarction, unspecified: Secondary | ICD-10-CM | POA: Diagnosis not present

## 2023-06-11 DIAGNOSIS — G40909 Epilepsy, unspecified, not intractable, without status epilepticus: Secondary | ICD-10-CM

## 2023-06-11 DIAGNOSIS — R2990 Unspecified symptoms and signs involving the nervous system: Secondary | ICD-10-CM | POA: Diagnosis not present

## 2023-06-11 LAB — GLUCOSE, CAPILLARY
Glucose-Capillary: 86 mg/dL (ref 70–99)
Glucose-Capillary: 160 mg/dL — ABNORMAL HIGH (ref 70–99)

## 2023-06-11 MED ORDER — ALLOPURINOL 100 MG PO TABS
100.0000 mg | ORAL_TABLET | Freq: Every day | ORAL | 0 refills | Status: DC
  Filled 2023-06-11: qty 30, 30d supply, fill #0

## 2023-06-11 MED ORDER — ATORVASTATIN CALCIUM 20 MG PO TABS
20.0000 mg | ORAL_TABLET | Freq: Every day | ORAL | 0 refills | Status: AC
  Filled 2023-06-11: qty 30, 30d supply, fill #0

## 2023-06-11 MED ORDER — LEVETIRACETAM 500 MG PO TABS
500.0000 mg | ORAL_TABLET | Freq: Two times a day (BID) | ORAL | 0 refills | Status: DC
  Filled 2023-06-11: qty 60, 30d supply, fill #0

## 2023-06-11 NOTE — Evaluation (Signed)
Physical Therapy Evaluation Patient Details Name: [name] MRN: [id number] DOB: March 11, 1951 Today's Date: 06/11/2023  History of Present Illness  72 y.o. female presents to [facility] hospital on 06/09/2023 with twitching of face and drooling. Pt appeared to have a seizure episode while in CT scanner. PMH: HTN, OA, gout, HLD, TIA, DM2, chronic LBP, PAD, R MCA CVA, migraines.  Clinical Impression  Pt presents to PT with deficits in strength, power, endurance, balance, functional mobility, cognition. Pt reports slowed processing at this time and demonstrates short term memory impairment throughout session. Pt is able to perform bed mobility with increased time, and transfers from bed to recliner similarly to how she transfers to [facility] at home. Pt does remark that the transfer is more difficult this morning than typical, likely related to generalized weakness. Pt will benefit from frequent mobilization in an effort to improve mobility quality and to reduce risk for falls. Pt declines the need for PT follow-up services. PT recommends discharge home with assistance form spouse when medically appropriate.        If plan is discharge home, recommend the following: A little help with walking and/or transfers;A lot of help with bathing/dressing/bathroom;Assistance with cooking/housework;Direct supervision/assist for medications management;Direct supervision/assist for financial management;Assist for transportation;Help with stairs or ramp for entrance;Supervision due to cognitive status   Can travel by private vehicle        Equipment Recommendations None recommended by PT  Recommendations for Other Services       Functional Status Assessment Patient has had a recent decline in their functional status and demonstrates the ability to make significant improvements in function in a reasonable and predictable amount of time.     Precautions / Restrictions Precautions Precautions: Fall Precaution  Comments: seizure Restrictions Weight Bearing Restrictions Per Provider Order: No      Mobility  Bed Mobility Overal bed mobility: Needs Assistance Bed Mobility: Supine to Sit     Supine to sit: Supervision, Used rails, HOB elevated     General bed mobility comments: increased time, use of rails    Transfers Overall transfer level: Needs assistance Equipment used: None Transfers: Bed to chair/wheelchair/BSC   Stand pivot transfers: Contact guard assist              Ambulation/Gait Ambulation/Gait assistance:  (pt does not ambulate at baseline)                Stairs            Wheelchair Mobility     Tilt Bed    Modified Rankin (Stroke Patients Only)       Balance Overall balance assessment: Needs assistance Sitting-balance support: No upper extremity supported, Feet supported Sitting balance-Leahy Scale: Good     Standing balance support: Single extremity supported, Reliant on assistive device for balance Standing balance-Leahy Scale: Poor                               Pertinent Vitals/Pain Pain Assessment Pain Assessment: No/denies pain    Home Living Family/patient expects to be discharged to:: Private residence Living Arrangements: Spouse/significant other Available Help at Discharge: Available 24 hours/day;Family Type of Home: Apartment Home Access: Ramped entrance       Home Layout: One level Home Equipment: Wheelchair - power;Wheelchair - manual;Transport chair;Hospital bed;Grab bars - toilet;Grab bars - tub/shower;Hand held Armed forces logistics/support/administrative officer (2 wheels) Additional Comments: transfers to power chair and back    Prior  Function Prior Level of Function : Needs assist             Mobility Comments: Transfer to power chair via stand pivots ADLs Comments: occasional assist from husband with LBD, sponge bath     Extremity/Trunk Assessment   Upper Extremity Assessment Upper Extremity  Assessment: Generalized weakness    Lower Extremity Assessment Lower Extremity Assessment: Generalized weakness    Cervical / Trunk Assessment Cervical / Trunk Assessment: Other exceptions Cervical / Trunk Exceptions: body habitus  Communication   Communication Communication: No apparent difficulties Cueing Techniques: Verbal cues  Cognition Arousal: Alert Behavior During Therapy: WFL for tasks assessed/performed Overall Cognitive Status: Impaired/Different from baseline Area of Impairment: Problem solving, Orientation, Memory, Safety/judgement, Awareness                 Orientation Level: Disoriented to, Situation   Memory: Decreased short-term memory   Safety/Judgement: Decreased awareness of deficits Awareness: Emergent Problem Solving: Slow processing          General Comments General comments (skin integrity, edema, etc.): VSS on RA    Exercises     Assessment/Plan    PT Assessment Patient needs continued PT services  PT Problem List Decreased strength;Decreased activity tolerance;Decreased mobility;Decreased balance;Decreased knowledge of use of DME       PT Treatment Interventions DME instruction;Functional mobility training;Therapeutic activities;Therapeutic exercise;Balance training;Neuromuscular re-education;Patient/family education;Wheelchair mobility training    PT Goals (Current goals can be found in the Care Plan section)  Acute Rehab PT Goals Patient Stated Goal: to return home PT Goal Formulation: With patient Time For Goal Achievement: 06/25/23 Potential to Achieve Goals: Good    Frequency Min 1X/week     Co-evaluation               AM-PAC PT "6 Clicks" Mobility  Outcome Measure Help needed turning from your back to your side while in a flat bed without using bedrails?: A Little Help needed moving from lying on your back to sitting on the side of a flat bed without using bedrails?: A Little Help needed moving to and from a  bed to a chair (including a wheelchair)?: A Little Help needed standing up from a chair using your arms (e.g., wheelchair or bedside chair)?: A Little Help needed to walk in hospital room?: Total Help needed climbing 3-5 steps with a railing? : Total 6 Click Score: 14    End of Session Equipment Utilized During Treatment: Gait belt Activity Tolerance: Patient tolerated treatment well Patient left: in chair;with call bell/phone within reach Nurse Communication: Mobility status PT Visit Diagnosis: Unsteadiness on feet (R26.81);Other abnormalities of gait and mobility (R26.89);Muscle weakness (generalized) (M62.81)    Time: 1191-4782 PT Time Calculation (min) (ACUTE ONLY): 28 min   Charges:   PT Evaluation $PT Eval Low Complexity: 1 Low   PT General Charges $$ ACUTE PT VISIT: 1 Visit         [name], PT, DPT Acute Rehabilitation Office [phone]   [name] 06/11/2023, 10:26 AM

## 2023-06-11 NOTE — Progress Notes (Signed)
NEUROLOGY CONSULT FOLLOW UP NOTE   Date of service: June 11, 2023 Patient Name: [name] MRN:  [id number] DOB:  [birth year]  Brief HPI  [name] is a 72 y.o. female who has a past medical history of Anxiety, Arthritis, Asthma, Chronic lower back pain, Colonic polyp, Depressive disorder, Gastric polyp, Gout, Headache, History of hiatal hernia, Hypertension, Migraine, Mixed hyperlipidemia, Osteoarthritis, TIA (transient ischemic attack) (11/2014), and Type II diabetes mellitus (HCC). who presented with tongue swelling, twitching of face and drooling.  On initial assessment in the ED, she had what appeared to be a seizure episode in CT with left facial twitching, garbled speech, dysarthria and drooling from the left side of her mouth, with residual left facial droop and dysarthria after the seizure-like activity abated. During her stay she has had recurrent seizures witnessed by staff with a semiology that appeared highly consistent with partial complex seizures, but that did not show a correlate on LTM EEG. Patient states that she has had seizure episodes in the past where she has left-sided twitching and subjective disorientation.  She said she has had a bad head injury after a fall in the past but did not seek medical care for it.  She has never had meningitis or encephalitis.  Nobody in her family has seizures, she never had febrile seizures as a child, and she did not have any problems with birth or early development.   Interval Hx/subjective   Today, patient remains hemodynamically stable and afebrile.  Long-term EEG has been discontinued and patient has been started on Keppra.  She states that she had an episode overnight but it was different from her previous episodes.  She stated that she felt a feeling of fear and then had difficulty moving at first bilateral arms and then the sensation was just present in her left arm.  She denies having facial twitching during that time.  She is  somewhat disoriented this morning.  Vitals   Vitals:   06/10/23 2001 06/10/23 2332 06/11/23 0325 06/11/23 0727  BP:  105/62 108/64 108/69  Pulse:  67 63 64  Resp:  18 18 18   Temp:  98.4 F (36.9 C) 98 F (36.7 C) 97.9 F (36.6 C)  TempSrc:  Axillary  Oral  SpO2: 96% 98% 100% 99%  Weight:      Height:         Body mass index is 36.74 kg/m.  Physical Exam   Constitutional: Appears well-developed and well-nourished.  Psych: Affect appropriate to situation.  Eyes: No scleral injection.  HENT: No OP obstrucion.  Head: Normocephalic.  Respiratory: Effort normal, non-labored breathing.  Skin: WDI.   Neurologic Examination   Mental Status: Alert and oriented to place, disoriented to time (gets month wrong) able to discuss some aspects of situation but not able to clearly remember events of yesterday. Speech/Language: speech is without aphasia but with slight dysarthria Cranial Nerves:  II: PERRL.  III, IV, VI: EOMI. Eyelids elevate symmetrically.  V: Sensation is intact to light touch and symmetrical to face.  VII: Smile is symmetrical.  VIII: hearing intact to voice. IX, X: Voice is slightly dysarthric XII: tongue is midline without fasciculations. Motor: Able to move bilateral upper extremities with good antigravity strength, able to move bilateral lower extremities off the bed but strength is weak Tone is normal and bulk is normal Sensation- Intact to light touch bilaterally.  Coordination: FTN intact bilaterally Gait- Deferred    Medications  Current Facility-Administered Medications:  acetaminophen (TYLENOL) tablet 650 mg, 650 mg, Oral, Q6H PRN **OR** acetaminophen (TYLENOL) suppository 650 mg, 650 mg, Rectal, Q6H PRN, Briscoe Burns, MD   albuterol (PROVENTIL) (2.5 MG/3ML) 0.083% nebulizer solution 3 mL, 3 mL, Inhalation, Q6H PRN, Briscoe Burns, MD   aspirin EC tablet 81 mg, 81 mg, Oral, Daily, Briscoe Burns, MD, 81 mg at 06/10/23 0911   atenolol  (TENORMIN) tablet 50 mg, 50 mg, Oral, q morning, 50 mg at 06/10/23 0911 **AND** atenolol (TENORMIN) tablet 25 mg, 25 mg, Oral, QHS, Jinwala, Sagar H, MD, 25 mg at 06/10/23 2135   atorvastatin (LIPITOR) tablet 20 mg, 20 mg, Oral, QHS, Jinwala, Sagar H, MD, 20 mg at 06/10/23 2135   enoxaparin (LOVENOX) injection 40 mg, 40 mg, Subcutaneous, Q24H, Jinwala, Sagar H, MD, 40 mg at 06/10/23 1658   gabapentin (NEURONTIN) capsule 300 mg, 300 mg, Oral, Daily, Austin Miles, Sagar H, MD, 300 mg at 06/10/23 0911   insulin aspart (novoLOG) injection 0-15 Units, 0-15 Units, Subcutaneous, TID WC, Briscoe Burns, MD, 2 Units at 06/10/23 1308   insulin glargine-yfgn (SEMGLEE) injection 15 Units, 15 Units, Subcutaneous, Daily, Briscoe Burns, MD, 15 Units at 06/10/23 0911   levETIRAcetam (KEPPRA) tablet 500 mg, 500 mg, Oral, BID, de Saintclair Halsted, [place] E, NP, 500 mg at 06/10/23 2135   LORazepam (ATIVAN) injection 2 mg, 2 mg, Intravenous, Q6H PRN, de Saintclair Halsted, Cortney E, NP   mometasone-formoterol (DULERA) 200-5 MCG/ACT inhaler 2 puff, 2 puff, Inhalation, BID, Briscoe Burns, MD, 2 puff at 06/10/23 2001   NIFEdipine (PROCARDIA-XL/NIFEDICAL-XL) 24 hr tablet 90 mg, 90 mg, Oral, Daily, Briscoe Burns, MD, 90 mg at 06/10/23 0911  Labs and Diagnostic Imaging   CBC:  Recent Labs  Lab 06/09/23 0851 06/09/23 0907 06/10/23 0554  WBC 6.8  --  5.5  NEUTROABS 4.3  --   --   HGB 12.3 12.9 11.7*  HCT 39.1 38.0 36.8  MCV 91.1  --  90.6  PLT 335  --  240    Basic Metabolic Panel:  Lab Results  Component Value Date   NA 139 06/10/2023   K 3.7 06/10/2023   CO2 18 (L) 06/10/2023   GLUCOSE 123 (H) 06/10/2023   BUN 6 (L) 06/10/2023   CREATININE 0.83 06/10/2023   CALCIUM 9.3 06/10/2023   GFRNONAA >60 06/10/2023   GFRAA >60 11/18/2019   Lipid Panel:  Lab Results  Component Value Date   LDLCALC 69 10/25/2022   HgbA1c:  Lab Results  Component Value Date   HGBA1C 6.4 (H) 06/09/2023   Urine Drug Screen:      Component Value Date/Time   LABOPIA NONE DETECTED 06/09/2023 1618   COCAINSCRNUR NONE DETECTED 06/09/2023 1618   LABBENZ NONE DETECTED 06/09/2023 1618   AMPHETMU NONE DETECTED 06/09/2023 1618   THCU NONE DETECTED 06/09/2023 1618   LABBARB NONE DETECTED 06/09/2023 1618    Alcohol Level     Component Value Date/Time   ETH <10 06/09/2023 0900   INR  Lab Results  Component Value Date   INR 1.0 06/09/2023   APTT  Lab Results  Component Value Date   APTT 32 06/09/2023    CT head: No acute abnormality  MRI Brain(Personally reviewed): No acute abnormality, chronic infarct in the right MCA territory, chronic small vessel ischemic changes  rEEG 12/25:  No seizures or epileptiform discharges.  No seizure activity or EEG changes seen during episode around 8 AM on 12/25  Assessment  Jens Som  Prisock is a 72 y.o. female with history of right MCA stroke, depression, hiatal hernia, hypertension, migraines, hyperlipidemia, TIA and diabetes who presented with left facial twitching and drooling as well as dystonia.  She was noted to have some seizure-like activity consisting of left facial twitching, garbled speech, dysarthria and drooling from the left side of her mouth while in the CT scanner.  This terminated with Ativan.  Patient states that she has had these episodes while at home and believes that they are seizures.  - She was placed on long-term EEG and had an episode overnight, but unfortunately EEG was disconnected at that time.  During exam this morning (Wednesday), she suddenly had another episode of left-sided facial twitching, tongue protrusion, dysarthria, transient disorientation and contracture of the left arm.  This episode lasted about 5 minutes, was terminated with lorazepam administration, and was followed by transient inability to move the left arm for about 2 to 3 minutes.  No changes were seen on EEG, but focal motor seizures may not be picked up on scalp EEG.   - LTM EEG  (06/09/2023 1417 to 06/10/2023 0730): This study is within normal limits. No seizures or epileptiform discharges were seen throughout the recording. Event button was pressed on 06/10/2023 at 0813 without concomitant EEG change. However, the semiology of episode in a patient with right MCA infarct is concerning for focal motor seizure which may not be seen on scalp EEG.  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 Seizure focus is most likely in the right hemisphere adjacent or near to her old cortically-based stroke.  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 -Patient states that she had an episode overnight which consisted of a feeling of fear and then difficulty moving first bilateral arms and then her left arm.  She denies facial twitching during this time, but dysarthria, disorientation and subjective left arm weakness are considering for postictal symptoms similar to what she experienced yesterday.  Episode seems more like an episode of sleep paralysis while in the process of waking, rather than the seizure seen yesterday.    Recommendations  -Continue Keppra 500 mg po twice daily.  -If there is seizure activity administer 2 mg of Ativan and call neurology -Continue seizure precautions inpatient and outpatient. Discussed [facility] statutes, patients with seizures are not allowed to drive until they have been seizure-free for six months. Use caution when using heavy equipment or power tools. Avoid working on ladders or at heights. Take showers instead of baths. Ensure the water temperature is not too high on the home water heater. Do not go swimming alone. Do not lock yourself in a room alone (i.e. bathroom). When  caring for infants or small children, sit down when holding, feeding, or changing them to minimize risk of injury to the child in the event you have a seizure. Maintain good sleep hygiene. Avoid alcohol.   - If no further seizures after initiation of Keppra, can be discharged home with outpatient Neurology follow up at [facility] or [facility].  ______________________________________________________________________   Signed, Cortney Harland Dingwall, NP [facility]   Electronically signed: Dr. Caryl Pina

## 2023-06-11 NOTE — Discharge Instructions (Signed)
-

## 2023-06-11 NOTE — TOC Transition Note (Signed)
Transition of Care [facility]) - Discharge Note   Patient Details  Name: [name] MRN: [id number] Date of Birth: 1950/12/08  [facility]) CM/SW Contact:  Kermit Balo, RN Phone Number: 06/11/2023, 12:54 PM   Clinical Narrative:     Pt is from home with her spouse. She is [facility] with [facility] for [facility]. CM updated, Arline Asp with [facility] of her admission and resumption of home health services.  DME at home: walker/ power wheelchair/ hospital bed/  Pt uses [address] [facility] for her PCP and they provide transportation for appointments. She uses family for other MD appts.  Pt has transportation home.   Final next level of care: Home w Home Health Services Barriers to Discharge: No Barriers Identified   Patient Goals and CMS Choice   CMS Medicare.gov Compare Post Acute Care list provided to:: Patient Choice offered to / list presented to : Patient      Discharge Placement                       Discharge Plan and Services Additional resources added to the After Visit Summary for                            HH Arranged: RN, PT, OT, Nurse's Aide [facility] Agency: [facility] Date [facility] Agency Contacted: 06/11/23   Representative spoke with at [facility] Agency: Arline Asp  Social Drivers of Health (SDOH) Interventions SDOH Screenings   Food Insecurity: No Food Insecurity (06/09/2023)  Housing: Low Risk  (06/09/2023)  Transportation Needs: No Transportation Needs (06/09/2023)  Utilities: Not At Risk (06/09/2023)  Tobacco Use: Low Risk  (06/09/2023)     Readmission Risk Interventions     No data to display

## 2023-06-11 NOTE — Progress Notes (Signed)
Discharge instruction explained to patient and husband at bedside, verbalized understanding. IV and telemetry removed. Patient transported to private vehicle via wheelchair.

## 2023-06-11 NOTE — Discharge Summary (Signed)
Physician Discharge Summary   Patient: [name] MRN: [id number] DOB: June 16, 1951  Admit date:     06/09/2023  Discharge date: 06/11/23  Discharge Physician: Rickey Barbara   PCP: Hillery Aldo, NP   Recommendations at discharge:    Follow up with PCP in 1-2 weeks  Discharge Diagnoses: Principal Problem:   Stroke-like symptoms Active Problems:   History of CVA (cerebrovascular accident)   Morbid obesity (HCC)   Insulin dependent type 2 diabetes mellitus (HCC)   Complex partial seizure (HCC)   Seizure (HCC)  Resolved Problems:   * No resolved hospital problems. *  Hospital Course: 72 y.o. female with medical history significant of type 2 diabetes, TIA (2016), chronic lower back pain, depression, anxiety, HTN, HLD, OA presenting to [facility] ED via EMS as a Code Stroke.   Patient reports feeling in her usual state of health last night prior going to bed around 2300. This morning, around 0745, she woke up feeling like she could not talk, felt her tongue was very swollen to the point where she could not swallow her saliva, and experienced drooling. She woke her husband up but was unable to speak well. Husband states that her voice was abnormal this morning. They called EMS who brought patient to ED. EMS was concerned for medication reaction given tongue swelling and thus gave patient benadryl en route. Of note, patient reports not starting any new medications recently.    Patient does have a history of acute right MCA infarct in 05/2022.    ED course: Vital signs stable.  CBC unremarkable.  CMP with glucose 150, bicarb 20, normal kidney function.  aPTT, PT-INR normal.  Ethanol level negative.  CT head code stroke with no acute findings.  MRI brain did not show any evidence of acute intracranial abnormality though was intermittently motion degraded.  Spot EEG without any seizures or epileptiform discharges.  Neurology consulted.  During neurology evaluation after CT head, patient exhibited  rhythmic contractions of left side of her face with worsening dysarthria and drooling from left side of her mouth which stopped after about 2 minutes.  She was given Ativan 2 mg IV.  [facility] hospitalist asked to evaluate patient for admission.  Assessment and Plan: Complex partial seizure -Neurology following, appreciate assistance -Long-term EEG with event button pressed this AM without concomitant EEG change. However the semiology of episode with her R MCA infarct can be concerning for focal motor seizure which may not be seen on scalp EEG -although pt's focal seizure did not have an electrographic correlate on EEG, the ones that have been observed by clinicians are stereotyped and have a semiology that is highly consistent with epileptic seizures. Pt was started on keppra per Neuro -Pt to follow up with Neurology as outpatient -See Neurology documentation. Pt has been made aware of driving restrictions for 6 months   Uncontrolled type 2 diabetes with neuropathy Patient on Tresiba 30 units twice daily at home.  A1c 8.1% about 7 months ago.  Glucose 150 in the ED today. -Cont SSI as needed while in hospital -gabapentin 300mg  daily -cont home meds on d/c   Hypertension -resumed home atenolol and nidepine   Bilateral lower extremity open wounds, appear chronic -consulted wound care   History of CVA -Continue home aspirin and Lipitor   Asthma Stable. -continue home albuterol -continue dulera in place of home symbicort   Depression GAD -home lurasidone was held as patient is unclear whether she is still taking this medication  Consultants: Neurology Procedures performed: EEG  Disposition: Home Diet recommendation:  Cardiac and Carb modified diet DISCHARGE MEDICATION: Allergies as of 06/11/2023       Reactions   Ace Inhibitors Swelling   Latex Itching, Swelling   Ultram [tramadol] Nausea And Vomiting   Zanaflex [tizanidine] Other (See Comments)   Tremors    Diprivan  [propofol] Itching   Metformin And Related Other (See Comments)   Tremors  Chills        Medication List     STOP taking these medications    lurasidone 40 MG Tabs tablet Commonly known as: Latuda       TAKE these medications    acetaminophen 500 MG tablet Commonly known as: TYLENOL Take 1,000 mg by mouth every 6 (six) hours as needed for moderate pain (pain score 4-6) or mild pain (pain score 1-3).   albuterol 108 (90 Base) MCG/ACT inhaler Commonly known as: VENTOLIN HFA Inhale 2 puffs into the lungs every 6 (six) hours as needed for wheezing or shortness of breath.   allopurinol 100 MG tablet Commonly known as: ZYLOPRIM Take 1 tablet (100 mg total) by mouth daily.   aspirin EC 81 MG tablet Take 81 mg by mouth daily. Swallow whole.   atenolol 25 MG tablet Commonly known as: TENORMIN Take 25-50 mg by mouth See admin instructions. Take 50 mg (2 tablets) every morning and 25 mg (1 tablet) every evening.   atorvastatin 20 MG tablet Commonly known as: LIPITOR Take 1 tablet (20 mg total) by mouth at bedtime.   budesonide-formoterol 160-4.5 MCG/ACT inhaler Commonly known as: SYMBICORT Inhale 2 puffs into the lungs daily.   cyclobenzaprine 5 MG tablet Commonly known as: FLEXERIL Take 5 mg by mouth 3 (three) times daily as needed for muscle spasms.   donepezil 10 MG tablet Commonly known as: ARICEPT Take 10 mg by mouth at bedtime.   gabapentin 300 MG capsule Commonly known as: NEURONTIN Take 300 mg by mouth daily.   levETIRAcetam 500 MG tablet Commonly known as: KEPPRA Take 1 tablet (500 mg total) by mouth 2 (two) times daily.   mirtazapine 7.5 MG tablet Commonly known as: REMERON Take 7.5 mg by mouth at bedtime.   Multivitamin Women 50+ Tabs Take 1 tablet by mouth at bedtime.   NIFEdipine 90 MG 24 hr tablet Commonly known as: PROCARDIA XL/NIFEDICAL-XL Take 90 mg by mouth daily.   Evaristo Bury FlexTouch 200 UNIT/ML FlexTouch Pen Generic drug: insulin  degludec Inject 30 Units into the skin in the morning and at bedtime.        Follow-up Information     Care, [facility] Follow up.   Specialty: Home Health Services Why: The home health agency will contact you for the first home visit. Contact information: [address] [place] [address] [phone]         Hillery Aldo, NP Follow up in 2 week(s).   Why: Hospital follow up Contact information: [address] [place] [address] [phone]                Discharge Exam: Ceasar Mons Weights   06/09/23 2030  Weight: 97.1 kg   General exam: Awake, laying in bed, in nad Respiratory system: Normal respiratory effort, no wheezing Cardiovascular system: regular rate, s1, s2 Gastrointestinal system: Soft, nondistended, positive BS Central nervous system: CN2-12 grossly intact, strength intact Extremities: Perfused, no clubbing Skin: Normal skin turgor, no notable skin lesions seen Psychiatry: Mood normal // no visual hallucinations  Condition at discharge: fair  The results of significant diagnostics from this hospitalization (including imaging, microbiology, ancillary and laboratory) are listed below for reference.   Imaging Studies: Overnight EEG with video Result Date: 06/10/2023 Charlsie Quest, MD     06/11/2023  8:11 AM Patient Name: [name] MRN: [id number] Epilepsy Attending: Charlsie Quest Referring Physician/Provider: Caryl Pina, MD Duration: 06/09/2023 1417 to 06/10/2023 1017 Patient history:  72 year old female presented with aphasia.  EEG evaluate for seizure. Level of alertness: Awake, asleep AEDs during EEG study: None Technical aspects: This EEG study was done with scalp electrodes positioned according to the 10-20 International system of electrode placement. Electrical activity was reviewed with band pass filter of 1-70Hz , sensitivity of 7 uV/mm, display speed of 81mm/sec with a 60Hz  notched filter applied as  appropriate. EEG data were recorded continuously and digitally stored.  Video monitoring was available and reviewed as appropriate. Description: The posterior dominant rhythm consists of 9 Hz activity of moderate voltage (25-35 uV) seen predominantly in posterior head regions, symmetric and reactive to eye opening and eye closing.  Sleep was characterized by vertex waves, sleep spindles (12 to 14 Hz), maximal frontocentral region. Hyperventilation and photic stimulation were not performed.   Event button was pressed on 06/10/2023 at 0813 for left sided facial twitching, left arm contortions, transient confusion and dysarthria followed by a few minutes of being unable to move her left arm. Concomitant eeg before, during and after the event didn't show any eeg change. EEG was disconnected between 06/09/2023 1842 to 0419 due to patient transport IMPRESSION: This study is within normal limits. No seizures or epileptiform discharges were seen throughout the recording. Event button was pressed on 06/10/2023 at 0813 as described above without concomitant eeg change. However, the semiology of episode in a patient with right MCA infarct is concerning for focal motor seizure which may not be seen on scalp EEG. Clinical correlation is recommended. A normal interictal EEG does not exclude the diagnosis of epilepsy. Charlsie Quest   MR BRAIN WO CONTRAST Result Date: 06/09/2023 CLINICAL DATA:  Provided history: Seizure, new onset, no history of trauma. EXAM: MRI HEAD WITHOUT CONTRAST TECHNIQUE: Multiplanar, multiecho pulse sequences of the brain and surrounding structures were obtained without intravenous contrast. COMPARISON:  Head CT 06/09/2023.  Brain MRI 10/25/2022. FINDINGS: Intermittently motion degraded examination (with up to moderate motion degradation of the acquired sequences). Within this limitation, findings are as follows. Brain: No age advanced or lobar predominant parenchymal atrophy. Known moderate-sized  chronic cortical/subcortical right MCA territory infarct within the right frontoparietal operculum/right insula. Chronic hemosiderin deposition and cortical laminar necrosis at this site. Mild multifocal T2 FLAIR hyperintense signal abnormality elsewhere within the cerebral white matter and pons, nonspecific but compatible with chronic small vessel ischemic disease. No appreciable hippocampal size or signal asymmetry. There is no acute infarct. No evidence of an intracranial mass. No extra-axial fluid collection. No midline shift. Vascular: Maintained flow voids within the proximal large arterial vessels. Skull and upper cervical spine: No focal worrisome marrow lesion. Incompletely assessed cervical spondylosis. Sinuses/Orbits: No mass or acute finding within the imaged orbits. No significant paranasal sinus disease. Other: Small-volume fluid within the bilateral mastoid air cells. IMPRESSION: 1. Intermittently motion degraded exam. 2. No evidence of an acute intracranial abnormality. 3. Known chronic cortical/subcortical right MCA territory infarct within the right frontoparietal operculum/right insula. 4. Background mild chronic small vessel ischemic changes within the cerebral white matter and pons, similar to the prior brain MRI of  10/25/2022. 5. Small-volume fluid within the bilateral mastoid air cells. Electronically Signed   By: Jackey Loge D.O.   On: 06/09/2023 12:52   EEG adult Result Date: 06/09/2023 Charlsie Quest, MD     06/09/2023 11:36 AM Patient Name: [name] MRN: [id number] Epilepsy Attending: Charlsie Quest Referring Physician/Provider: Elmer Picker, NP Date: 06/09/2023 Duration: 26.19 mins Patient history: 72 year old female presented with aphasia.  EEG evaluate for seizure. Level of alertness: Awake AEDs during EEG study: Ativan Technical aspects: This EEG study was done with scalp electrodes positioned according to the 10-20 International system of electrode placement.  Electrical activity was reviewed with band pass filter of 1-70Hz , sensitivity of 7 uV/mm, display speed of 56mm/sec with a 60Hz  notched filter applied as appropriate. EEG data were recorded continuously and digitally stored.  Video monitoring was available and reviewed as appropriate. Description: The posterior dominant rhythm consists of 9 Hz activity of moderate voltage (25-35 uV) seen predominantly in posterior head regions, symmetric and reactive to eye opening and eye closing. Physiologic photic driving was not seen during photic stimulation.  Hyperventilation was not performed Of note, parts of study were technically difficult due to significant myogenic artifact. IMPRESSION: This technically difficult study is within normal limits. No seizures or epileptiform discharges were seen throughout the recording. A normal interictal EEG does not exclude the diagnosis of epilepsy. Charlsie Quest   CT HEAD CODE STROKE WO CONTRAST Result Date: 06/09/2023 CLINICAL DATA:  Code stroke. Neuro deficit, acute, stroke suspected. Aphasia. EXAM: CT HEAD WITHOUT CONTRAST TECHNIQUE: Contiguous axial images were obtained from the base of the skull through the vertex without intravenous contrast. RADIATION DOSE REDUCTION: This exam was performed according to the departmental dose-optimization program which includes automated exposure control, adjustment of the mA and/or kV according to patient size and/or use of iterative reconstruction technique. COMPARISON:  02/17/2023 FINDINGS: Brain: The study suffers from some motion degradation. 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 No new stroke identified. No mass, hemorrhage, hydrocephalus or extra-axial collection. Vascular: No abnormal vascular finding. Skull: Negative Sinuses/Orbits: Clear/normal Other: None ASPECTS (Alberta Stroke Program Early CT Score) - Ganglionic level infarction (caudate, lentiform nuclei, internal capsule, insula, M1-M3  cortex): 7, allowing for the old stroke. - Supraganglionic infarction (M4-M6 cortex): Three, allowing for the old stroke. Total score (0-10 with 10 being normal): 10, allowing for the old stroke. IMPRESSION: 1. No acute CT finding. 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 2. Aspects is 10, allowing for the old stroke. 3. These results were communicated to Dr. Otelia Limes at 9:33 am on 06/09/2023 by text page via the [facility] messaging system. Electronically Signed   By: Paulina Fusi M.D.   On: 06/09/2023 09:34   DG Tibia/Fibula Right Result Date: 05/19/2023 CLINICAL DATA:  Right lower extremity pain. Concern for infected wounds. EXAM: RIGHT TIBIA AND FIBULA - 2 VIEW COMPARISON:  Right lower extremity radiograph dated 02/17/2023. FINDINGS: There is no acute fracture or dislocation. The bones are osteopenic. Severe arthritic changes of the knee with severe joint space narrowing and spurring. There is diffuse subcutaneous edema and scattered subcutaneous calcification, likely sequela of chronic venous stasis. No radiopaque foreign object or soft tissue gas. IMPRESSION: 1. No acute fracture or dislocation. 2. Diffuse subcutaneous edema. Electronically Signed   By: Elgie Collard M.D.   On: 05/19/2023 19:55   DG Foot Complete Left Result Date: 05/19/2023 CLINICAL DATA:  Infected leg wounds for 1 year. EXAM: LEFT FOOT -  COMPLETE 3+ VIEW COMPARISON:  10/22/2022 FINDINGS: Degenerative changes in the interphalangeal joints, first metatarsal-phalangeal joint, and intertarsal joints. Focal erosion again demonstrated in the first metatarsal head possibly indicating degenerative cyst and unchanged. Developing lucencies at the fourth metatarsal head could indicate osteomyelitis in the appropriate setting. No acute fracture or dislocation. Diffuse soft tissue swelling. No radiopaque soft tissue foreign bodies or soft tissue gas. Soft tissue calcifications in the ankle, likely phleboliths or dystrophic calcification.  IMPRESSION: Degenerative changes in the left foot. No acute bony abnormalities. New bone erosions in the fourth metatarsal head are nonspecific and could represent osteomyelitis in the appropriate clinical setting. Electronically Signed   By: Burman Nieves M.D.   On: 05/19/2023 19:53   DG Tibia/Fibula Left Result Date: 05/19/2023 CLINICAL DATA:  Infected leg wounds for 1 year. EXAM: LEFT TIBIA AND FIBULA - 2 VIEW COMPARISON:  02/17/2023 FINDINGS: Degenerative changes demonstrated in the left knee and left ankle joints. Tibia and fibula appear intact. No evidence of acute fracture or dislocation. No bone sclerosis or cortical erosion to suggest osteomyelitis. There is extensive soft tissue calcification likely representing phleboliths or dystrophic calcification. No soft tissue gas or radiopaque foreign bodies are demonstrated. Similar appearance to previous study. IMPRESSION: 1. Degenerative changes in the knee and ankle. No acute bony abnormalities. 2. Extensive soft tissue calcification, likely phleboliths or dystrophic. No radiopaque foreign bodies or soft tissue gas. Electronically Signed   By: Burman Nieves M.D.   On: 05/19/2023 19:51    Microbiology: Results for orders placed or performed during the hospital encounter of 05/19/23  Culture, blood (routine x 2)     Status: Abnormal   Collection Time: 05/19/23 12:12 PM   Specimen: BLOOD  Result Value Ref Range Status   Specimen Description BLOOD RIGHT ANTECUBITAL  Final   Special Requests   Final    BOTTLES DRAWN AEROBIC AND ANAEROBIC Blood Culture adequate volume   Culture  Setup Time   Final    GRAM POSITIVE COCCI IN CHAINS ANAEROBIC BOTTLE ONLY CRITICAL RESULT CALLED TO, READ BACK BY AND VERIFIED WITH: PHARMD ELIZABETH M. [id number], ADC    Culture (A)  Final    CUTIBACTERIUM ACNES Standardized susceptibility testing for this organism is not available. Performed at  [facility], [address], [place], [place] [address]     Report Status 05/24/2023 FINAL  Final  Blood Culture ID Panel (Reflexed)     Status: None   Collection Time: 05/19/23 12:12 PM  Result Value Ref Range Status   Enterococcus faecalis NOT DETECTED NOT DETECTED Final   Enterococcus Faecium NOT DETECTED NOT DETECTED Final   Listeria monocytogenes NOT DETECTED NOT DETECTED Final   Staphylococcus species NOT DETECTED NOT DETECTED Final   Staphylococcus aureus (BCID) NOT DETECTED NOT DETECTED Final   Staphylococcus epidermidis NOT DETECTED NOT DETECTED Final   Staphylococcus lugdunensis NOT DETECTED NOT DETECTED Final   Streptococcus species NOT DETECTED NOT DETECTED Final   Streptococcus agalactiae NOT DETECTED NOT DETECTED Final   Streptococcus pneumoniae NOT DETECTED NOT DETECTED Final   Streptococcus pyogenes NOT DETECTED NOT DETECTED Final   A.calcoaceticus-baumannii NOT DETECTED NOT DETECTED Final   Bacteroides fragilis NOT DETECTED NOT DETECTED Final   Enterobacterales NOT DETECTED NOT DETECTED Final   Enterobacter cloacae complex NOT DETECTED NOT DETECTED Final   Escherichia coli NOT DETECTED NOT DETECTED Final   Klebsiella aerogenes NOT DETECTED NOT DETECTED Final   Klebsiella oxytoca NOT DETECTED NOT DETECTED Final   Klebsiella pneumoniae NOT  DETECTED NOT DETECTED Final   Proteus species NOT DETECTED NOT DETECTED Final   Salmonella species NOT DETECTED NOT DETECTED Final   Serratia marcescens NOT DETECTED NOT DETECTED Final   Haemophilus influenzae NOT DETECTED NOT DETECTED Final   Neisseria meningitidis NOT DETECTED NOT DETECTED Final   Pseudomonas aeruginosa NOT DETECTED NOT DETECTED Final   Stenotrophomonas maltophilia NOT DETECTED NOT DETECTED Final   Candida albicans NOT DETECTED NOT DETECTED Final   Candida auris NOT DETECTED NOT DETECTED Final   Candida glabrata NOT DETECTED NOT DETECTED Final   Candida krusei NOT DETECTED NOT DETECTED Final   Candida parapsilosis NOT DETECTED NOT DETECTED Final   Candida tropicalis  NOT DETECTED NOT DETECTED Final   Cryptococcus neoformans/gattii NOT DETECTED NOT DETECTED Final    Comment: Performed at [facility], [address] [place], [place] [address]  Culture, blood (routine x 2)     Status: None   Collection Time: 05/19/23  6:10 PM   Specimen: BLOOD RIGHT ARM  Result Value Ref Range Status   Specimen Description BLOOD RIGHT ARM  Final   Special Requests   Final    BOTTLES DRAWN AEROBIC AND ANAEROBIC Blood Culture results may not be optimal due to an inadequate volume of blood received in culture bottles   Culture   Final    NO GROWTH 5 DAYS Performed at [facility], [address] [place], [place] [address]    Report Status 05/24/2023 FINAL  Final    Labs: CBC: Recent Labs  Lab 06/09/23 0851 06/09/23 0907 06/10/23 0554  WBC 6.8  --  5.5  NEUTROABS 4.3  --   --   HGB 12.3 12.9 11.7*  HCT 39.1 38.0 36.8  MCV 91.1  --  90.6  PLT 335  --  240   Basic Metabolic Panel: Recent Labs  Lab 06/09/23 0851 06/09/23 0907 06/10/23 0554  NA 138 140 139  K 4.0 4.7 3.7  CL 107 108 110  CO2 20*  --  18*  GLUCOSE 150* 151* 123*  BUN 5* 5* 6*  CREATININE 0.86 0.80 0.83  CALCIUM 9.7  --  9.3   Liver Function Tests: Recent Labs  Lab 06/09/23 0851  AST 19  ALT 10  ALKPHOS 103  BILITOT 0.8  PROT 8.5*  ALBUMIN 3.1*   CBG: Recent Labs  Lab 06/10/23 1252 06/10/23 1614 06/10/23 2125 06/11/23 0616 06/11/23 1141  GLUCAP 147* 79 97 86 160*    Discharge time spent: less than 30 minutes.  Signed: Rickey Barbara, MD [facility] 06/11/2023

## 2023-06-11 NOTE — Evaluation (Signed)
Occupational Therapy Evaluation Patient Details Name: [name] MRN: [id number] DOB: [birth year] Today's Date: 06/11/2023   History of Present Illness 72 y.o. female presents to [facility] on 06/09/2023 with twitching of face and drooling. Pt appeared to have a seizure episode while in CT scanner. PMH: HTN, OA, gout, HLD, TIA, DM2, chronic LBP, PAD, R MCA CVA, migraines.   Clinical Impression   [name] was evaluated s/p the above admission list. She uses a PWC and is mod I for SP transfers at baseline, she completes most ADLs with mod I but her husband will assist with LB ADLs if needed. Upon evaluation the pt was limited by generalized weakness, limited activity tolerance and impaired cognition(baseline?). Overall she needed superivsion- CGA for all aspects of mobility assessed. Due to the deficits listed below the pt also needs up to mod A for LB ADLs and set up A for UB ADLs, anticipate pt is fairly close to her functional baseline. Pt will benefit from continued acute OT services and HHOT.        If plan is discharge home, recommend the following: A little help with walking and/or transfers;A lot of help with bathing/dressing/bathroom;Assistance with cooking/housework;Assist for transportation;Help with stairs or ramp for entrance    Functional Status Assessment  Patient has had a recent decline in their functional status and demonstrates the ability to make significant improvements in function in a reasonable and predictable amount of time.  Equipment Recommendations  None recommended by OT       Precautions / Restrictions Precautions Precautions: Fall Precaution Comments: seizure Restrictions Weight Bearing Restrictions Per Provider Order: No      Mobility Bed Mobility Overal bed mobility: Needs Assistance Bed Mobility: Supine to Sit, Rolling, Sit to Supine Rolling: Supervision   Supine to sit: Supervision Sit to supine: Supervision   General bed mobility comments:  incr time/effort    Transfers Overall transfer level: Needs assistance Equipment used: None Transfers: Sit to/from Stand Sit to Stand: Contact guard assist                  Balance Overall balance assessment: Needs assistance Sitting-balance support: No upper extremity supported, Feet supported Sitting balance-Leahy Scale: Good     Standing balance support: Single extremity supported, Reliant on assistive device for balance Standing balance-Leahy Scale: Poor                             ADL either performed or assessed with clinical judgement   ADL Overall ADL's : Needs assistance/impaired Eating/Feeding: Independent   Grooming: Set up;Sitting   Upper Body Bathing: Set up;Sitting   Lower Body Bathing: Moderate assistance;Sit to/from stand   Upper Body Dressing : Set up;Sitting   Lower Body Dressing: Sit to/from stand;Minimal assistance   Toilet Transfer: Contact guard assist   Toileting- Clothing Manipulation and Hygiene: Minimal assistance;Sitting/lateral lean       Functional mobility during ADLs: Contact guard assist General ADL Comments: likely near her funcitonal baseline     Vision Baseline Vision/History: 0 No visual deficits Vision Assessment?: No apparent visual deficits     Perception Perception: Not tested       Praxis Praxis: Not tested       Pertinent Vitals/Pain Pain Assessment Pain Assessment: No/denies pain     Extremity/Trunk Assessment Upper Extremity Assessment Upper Extremity Assessment: Generalized weakness;LUE deficits/detail LUE Deficits / Details: L weaker than R but WFL   Lower Extremity Assessment Lower  Extremity Assessment: Defer to PT evaluation   Cervical / Trunk Assessment Cervical / Trunk Assessment: Other exceptions Cervical / Trunk Exceptions: body habitus   Communication Communication Communication: No apparent difficulties Cueing Techniques: Verbal cues   Cognition Arousal: Alert Behavior  During Therapy: WFL for tasks assessed/performed Overall Cognitive Status: Impaired/Different from baseline Area of Impairment: Problem solving, Orientation, Memory, Safety/judgement, Awareness                 Orientation Level: Disoriented to, Situation   Memory: Decreased short-term memory   Safety/Judgement: Decreased awareness of deficits Awareness: Emergent Problem Solving: Slow processing General Comments: WFL for command following, intermittently confused. Frequently asked "why am I here" but then able to explain details of admission     General Comments  VSS on RA            Home Living Family/patient expects to be discharged to:: Private residence Living Arrangements: Spouse/significant other Available Help at Discharge: Available 24 hours/day;Family Type of Home: Apartment Home Access: Ramped entrance     Home Layout: One level     Bathroom Shower/Tub: Chief Strategy Officer: Standard Bathroom Accessibility: Yes How Accessible: Accessible via wheelchair Home Equipment: Wheelchair - power;Wheelchair - manual;Transport chair;Hospital bed;Grab bars - toilet;Grab bars - tub/shower;Hand held Armed forces logistics/support/administrative officer (2 wheels)   Additional Comments: transfers to power chair and back      Prior Functioning/Environment Prior Level of Function : Needs assist       Physical Assist : Mobility (physical);ADLs (physical) Mobility (physical): Transfers ADLs (physical): IADLs;Dressing Mobility Comments: Transfer to power chair via stand pivots ADLs Comments: occasional assist from husband with LBD, sponge bath, PWC fits into bathroom        OT Problem List: Impaired balance (sitting and/or standing);Pain;Increased edema;Decreased strength      OT Treatment/Interventions: Self-care/ADL training;Balance training;Therapeutic exercise;Therapeutic activities;Patient/family education    OT Goals(Current goals can be found in the care plan  section) Acute Rehab OT Goals Patient Stated Goal: to go home OT Goal Formulation: With patient Time For Goal Achievement: 06/25/23 Potential to Achieve Goals: Fair ADL Goals Pt Will Perform Lower Body Bathing: with supervision;sit to/from stand Pt Will Perform Lower Body Dressing: with supervision;sit to/from stand Pt Will Transfer to Toilet: with supervision;stand pivot transfer  OT Frequency: Min 1X/week       AM-PAC OT "6 Clicks" Daily Activity     Outcome Measure Help from another person eating meals?: None Help from another person taking care of personal grooming?: A Little Help from another person toileting, which includes using toliet, bedpan, or urinal?: A Little Help from another person bathing (including washing, rinsing, drying)?: A Lot Help from another person to put on and taking off regular upper body clothing?: A Little Help from another person to put on and taking off regular lower body clothing?: A Little 6 Click Score: 18   End of Session Nurse Communication: Mobility status  Activity Tolerance: Patient tolerated treatment well Patient left: in bed;with call bell/phone within reach;with bed alarm set  OT Visit Diagnosis: Pain;Unsteadiness on feet (R26.81);Muscle weakness (generalized) (M62.81)                Time: 1140-1201 OT Time Calculation (min): 21 min Charges:  OT General Charges $OT Visit: 1 Visit OT Evaluation $OT Eval Moderate Complexity: 1 Mod  Derenda Mis, OTR/L Acute Rehabilitation Services Office [phone] Secure Chat Communication Preferred   Donia Pounds 06/11/2023, 12:44 PM

## 2023-06-18 ENCOUNTER — Other Ambulatory Visit (HOSPITAL_COMMUNITY): Payer: Self-pay

## 2023-07-15 ENCOUNTER — Ambulatory Visit: Payer: Medicare PPO | Admitting: Family

## 2023-07-15 ENCOUNTER — Encounter: Payer: Self-pay | Admitting: Family

## 2023-07-15 DIAGNOSIS — S81801D Unspecified open wound, right lower leg, subsequent encounter: Secondary | ICD-10-CM | POA: Diagnosis not present

## 2023-07-15 DIAGNOSIS — I872 Venous insufficiency (chronic) (peripheral): Secondary | ICD-10-CM

## 2023-07-17 NOTE — Progress Notes (Signed)
Office Visit Note   Patient: [name]           Date of Birth: Dec 23, 1950           MRN: [id number] Visit Date: 07/15/2023              Requested by: Hillery Aldo, NP [address] [place],  [place] [address] PCP: Hillery Aldo, NP  Chief Complaint  Patient presents with   Right Leg - Follow-up   Left Leg - Follow-up      HPI: The patient is a 73 year old woman who presents in follow-up for management of her chronic bilateral lower extremity edema and ulcerations.  She was last seen in December 18.  She did have a hospitalization at the end of December and since that time has been having assistance from [place] with her dressing changes.  Reports they have been applying Vaseline gauze underneath her wraps.  Feet are dependent majority of the day Has not had any fever chills or drainage concerning for infection  Assessment & Plan: Visit Diagnoses: No diagnosis found.  Plan: Again discussed elevation.  Have recommended they stop using the Vaseline gauze due to maceration.  Dry dressings Ace for compression Follow-Up Instructions: Return in about 4 weeks (around 08/12/2023).   Ortho Exam  Patient is alert, oriented, no adenopathy, well-dressed, normal affect, normal respiratory effort. On examination bilateral lower extremities the ulcers are stable.  The left foot dorsal ulcers are actually improved.  Less maceration than last month.  On examination of the left foot the medial ankle ulcer is healed..  Are superficial these are stable in size there is surrounding maceration no active drainage no erythema or warmth.  On examination of the left foot there is reduced maceration and ulceration over the dorsum of her foot the left foot dorsal ulcers continue without 3 mm of depth this is also unchanged on examination right lower extremity she does also have ulceration to the dorsal ankle and foot these are superficial today 1 mm of depth and reduced in size this is stable.  there  is no fibrinous tissue or purulence  Imaging: No results found.     Labs: Lab Results  Component Value Date   HGBA1C 6.4 (H) 06/09/2023   HGBA1C 8.1 (H) 10/23/2022   HGBA1C 9.5 (H) 06/11/2022   ESRSEDRATE 110 (H) 05/20/2023   ESRSEDRATE 90 (H) 10/23/2022   ESRSEDRATE 117 (H) 03/06/2021   CRP 4.7 (H) 05/20/2023   CRP 3.8 (H) 10/23/2022   CRP 43.7 (H) 03/06/2021   LABURIC 7.7 (H) 05/20/2023   REPTSTATUS 05/24/2023 FINAL 05/19/2023   GRAMSTAIN  10/24/2022    ABUNDANT SQUAMOUS EPITHELIAL CELLS PRESENT FEW WBC PRESENT, PREDOMINANTLY PMN MODERATE GRAM POSITIVE RODS MODERATE GRAM POSITIVE COCCI FEW GRAM NEGATIVE RODS    CULT  05/19/2023    NO GROWTH 5 DAYS Performed at [facility], [address] [place], [place] [address]    LABORGA PROTEUS MIRABILIS 10/24/2022   LABORGA STAPHYLOCOCCUS AUREUS 10/24/2022   LABORGA PSEUDOMONAS AERUGINOSA 10/24/2022     Lab Results  Component Value Date   ALBUMIN 3.1 (L) 06/09/2023   ALBUMIN 2.7 (L) 05/19/2023   ALBUMIN 2.7 (L) 02/19/2023   PREALBUMIN 12.5 (L) 03/16/2017    Lab Results  Component Value Date   MG 2.1 05/20/2023   MG 2.1 06/27/2022   MG 1.9 06/25/2022   No results found for: "VD25OH"  Lab Results  Component Value Date   PREALBUMIN 12.5 (L) 03/16/2017  Latest Ref Rng & Units 06/10/2023    5:54 AM 06/09/2023    9:07 AM 06/09/2023    8:51 AM  CBC EXTENDED  WBC 4.0 - 10.5 K/uL 5.5   6.8   RBC 3.87 - 5.11 MIL/uL 4.06   4.29   Hemoglobin 12.0 - 15.0 g/dL 21.3  08.6  57.8   HCT 36.0 - 46.0 % 36.8  38.0  39.1   Platelets 150 - 400 K/uL 240   335   NEUT# 1.7 - 7.7 K/uL   4.3   Lymph# 0.7 - 4.0 K/uL   1.8      There is no height or weight on file to calculate BMI.  Orders:  No orders of the defined types were placed in this encounter.  No orders of the defined types were placed in this encounter.    Procedures: No procedures performed  Clinical Data: No additional findings.  ROS:  All  other systems negative, except as noted in the HPI. Review of Systems  Objective: Vital Signs: There were no vitals taken for this visit.  Specialty Comments:  No specialty comments available.  PMFS History: Patient Active Problem List   Diagnosis Date Noted   Seizure (HCC) 06/10/2023   Stroke-like symptoms 06/09/2023   Complex partial seizure (HCC) 06/09/2023   Ulcer of extremity due to chronic venous insufficiency (HCC) 02/19/2023   Lymphedema 02/19/2023   SBO (small bowel obstruction) (HCC) 02/18/2023   GAD (generalized anxiety disorder) 02/18/2023   Asthma, chronic 02/18/2023   Diabetic ulcer of left lower leg (HCC) 10/24/2022   Chronic venous hypertension (idiopathic) with ulcer and inflammation of bilateral lower extremity (HCC) 10/24/2022   Non-healing wound of lower extremity 10/23/2022   Bacteria in urine 06/26/2022   Stasis ulcer of lower extremity (HCC) 06/26/2022   Elevated troponin 06/25/2022   Abnormal urinalysis 06/25/2022   History of CVA (cerebrovascular accident) 06/25/2022   Memory loss 06/25/2022   Cryptogenic stroke (HCC) 06/11/2022   Middle cerebral artery embolism, 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   Wound infection 02/05/2021   Venous stasis dermatitis of both lower extremities    Pneumonia due to COVID-19 virus 06/29/2019   Hypophosphatemia 06/24/2019   Obesity 06/23/2019   Insulin dependent type 2 diabetes mellitus (HCC) 06/22/2019   Anxiety 06/22/2019   Essential hypertension 06/22/2019   Hyperlipidemia 06/22/2019   COVID-19 06/21/2019   Severe protein-calorie malnutrition (HCC) 06/21/2019   Acute respiratory failure with hypoxia (HCC) 06/21/2019   Diabetic foot ulcer associated with type 2 diabetes mellitus (HCC)    Cellulitis 07/05/2016   Depression 05/30/2016   Lower extremity ulceration (HCC) 05/30/2016   Generalized weakness 05/30/2016   Left arm weakness 05/30/2016   Left Lower extremity pain   04/02/2016   Lower extremity pain, inferior, left 04/02/2016   Left leg pain    Personal history of noncompliance with medical treatment, presenting hazards to health 03/31/2016   Jaw pain 03/27/2016   Left leg cellulitis 03/24/2016   Cellulitis of left lower extremity    Primary insomnia    Adjustment disorder    Morbid obesity (HCC) 01/27/2016   Wheelchair dependent 01/27/2016   Gout 01/27/2016   Arthritis 01/27/2016   Uncontrolled type 2 diabetes mellitus with hypoglycemia, with long-term current use of insulin (HCC) 01/25/2016   Cellulitis of left leg 01/11/2016   Insulin-requiring or dependent type II diabetes mellitus (HCC) 01/11/2016   Past Medical History:  Diagnosis Date   Anxiety  Arthritis    "back, arms, legs" (03/24/2016)   Asthma    Chronic lower back pain    Colonic polyp    last colonoscopy done in 2009 with normal results per medical record   Depressive disorder    Gastric polyp    Gout    has taken allopurinol 300mg  once daily in past   Headache    History of hiatal hernia    Hypertension    Migraine    "none in awhile; might have a couple/year" (03/24/2016)   Mixed hyperlipidemia    01/2016 Total chol 141, HDL 59, LDL 63, ration 1.1   Osteoarthritis    TIA (transient ischemic attack) 11/2014   Type II diabetes mellitus (HCC)     Family History  Problem Relation Age of Onset   Stroke Father    Stroke Brother    Cancer Other    Gout Mother    Hypertension Mother    Arthritis Mother    Asthma Mother    Stroke Brother     Past Surgical History:  Procedure Laterality Date   ARTERY BIOPSY Right 08/12/2016   Procedure: BIOPSY TEMPORAL ARTERY;  Surgeon: Sherren Kerns, MD;  Location: [facility] OR;  Service: Vascular;  Laterality: Right;  BIOPSY TEMPORAL ARTERY   BREAST BIOPSY Left ~ 2015   benign   CARPAL TUNNEL RELEASE Bilateral    DILATION AND CURETTAGE OF UTERUS     I & D EXTREMITY Bilateral 10/24/2022   Procedure: DEBRIDEMENT LEFT LEG AND RIGHT  ANKLE;  Surgeon: Nadara Mustard, MD;  Location: [facility] OR;  Service: Orthopedics;  Laterality: Bilateral;   IR CT HEAD LTD  06/11/2022   IR PERCUTANEOUS ART THROMBECTOMY/INFUSION INTRACRANIAL INC DIAG ANGIO  06/11/2022   IR US GUIDE VASC ACCESS RIGHT  06/11/2022   KNEE ARTHROSCOPY Right 2003   in [facility]   LAPAROSCOPIC CHOLECYSTECTOMY     LOOP RECORDER INSERTION N/A 06/26/2022   Procedure: LOOP RECORDER INSERTION;  Surgeon: Regan Lemming, MD;  Location: [facility] INVASIVE CV LAB;  Service: Cardiovascular;  Laterality: N/A;   RADIOLOGY WITH ANESTHESIA N/A 06/11/2022   Procedure: IR WITH ANESTHESIA;  Surgeon: Radiologist, Medication, MD;  Location: [facility] OR;  Service: Radiology;  Laterality: N/A;   TUBAL LIGATION     VAGINAL HYSTERECTOMY  1982   Social History   Occupational History   Not on file  Tobacco Use   Smoking status: Never   Smokeless tobacco: Never  Vaping Use   Vaping status: Never Used  Substance and Sexual Activity   Alcohol use: No   Drug use: No   Sexual activity: Never

## 2023-07-19 NOTE — Progress Notes (Deleted)
 [facility]  PATIENT: [name] DOB: 14-Jul-1950  REFERRING DOCTOR OR PCP:  *** SOURCE: ***  _________________________________   HISTORICAL  CHIEF COMPLAINT:  No chief complaint on file.   HISTORY OF PRESENT ILLNESS:  I had the pleasure of seeing your patient, [name], at [facility] for neurologic consultation regarding her history of stroke and more recent seizures   She is a 73 year old woman with history of right MCA distribution stroke, type 2 diabetes, hypertension, hyperlipidemia, depression and chronic back pain who presented to the emergency room on 06/09/2023 after waking up.  That she was unable to talk and noticing difficulty swallowing her saliva her husband noted that she was having difficulty communicating and called EMS and she was taken to [facility] emergency room.  In the emergency room, she had an MRI that did not show any acute intracranial abnormality.  EEG did not show any seizures or epileptiform discharges.  While being evaluated by neurology, she had rhythmic contractions of the left side of her face and worsening dysarthria lasting 2 minutes.  This was felt to be consistent with seizure.  She was given Ativan.  He had additional spells while in the hospital, many witnessed by staff.  However, EEG did not show epileptiform activity during those times.  She was started on Keppra and is on 500 mg p.o. twice daily.  She is also on gabapentin for her chronic pain issues.  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  There was chronic microvascular ischemic change of the hemispheres and pons.  No new foci compared to the MRI dated 10/25/2022  MRI of 06/12/2022 showed an acute ischemic  stroke in the right MCA distribution involving the frontal lobe predominantly as well as the parietal operculum.  There was also mild chronic microvascular ischemic changes in the hemispheres and pons  REVIEW OF SYSTEMS: Constitutional: No fevers, chills, sweats, or change in appetite Eyes: No visual changes, double vision, eye pain Ear, nose and throat: No hearing loss, ear pain, nasal congestion, sore throat Cardiovascular: No chest pain, palpitations Respiratory:  No shortness of breath at rest or with exertion.   No wheezes GastrointestinaI: No nausea, vomiting, diarrhea, abdominal pain, fecal incontinence Genitourinary:  No dysuria, urinary retention or frequency.  No nocturia. Musculoskeletal:  No neck pain, back pain Integumentary: No rash, pruritus, skin lesions Neurological: as above Psychiatric: No depression at this time.  No anxiety Endocrine: No palpitations, diaphoresis, change in appetite, change in weigh or increased thirst Hematologic/Lymphatic:  No anemia, purpura, petechiae. Allergic/Immunologic: No itchy/runny eyes, nasal congestion, recent allergic reactions, rashes  ALLERGIES: Allergies  Allergen Reactions   Ace Inhibitors Swelling   Latex Itching and Swelling   Ultram [Tramadol] Nausea And Vomiting   Zanaflex [Tizanidine] Other (See Comments)    Tremors    Diprivan [Propofol] Itching   Metformin And Related Other (See Comments)    Tremors  Chills    HOME MEDICATIONS:  Current Outpatient Medications:    acetaminophen (TYLENOL) 500 MG tablet, Take 1,000 mg by mouth every 6 (six) hours as needed for moderate pain (pain score 4-6) or mild pain (pain score 1-3)., Disp: , Rfl:    albuterol (VENTOLIN HFA) 108 (90 Base) MCG/ACT inhaler, Inhale 2 puffs into  the lungs every 6 (six) hours as needed for wheezing or shortness of breath., Disp: , Rfl:    allopurinol (ZYLOPRIM) 100 MG tablet, Take 1 tablet (100 mg total) by mouth daily., Disp: 30 tablet, Rfl: 0   aspirin  EC 81 MG tablet, Take 81 mg by mouth daily. Swallow whole., Disp: , Rfl:    atenolol (TENORMIN) 25 MG tablet, Take 25-50 mg by mouth See admin instructions. Take 50 mg (2 tablets) every morning and 25 mg (1 tablet) every evening., Disp: , Rfl:    atorvastatin (LIPITOR) 20 MG tablet, Take 1 tablet (20 mg total) by mouth at bedtime., Disp: 30 tablet, Rfl: 0   budesonide-formoterol (SYMBICORT) 160-4.5 MCG/ACT inhaler, Inhale 2 puffs into the lungs daily., Disp: , Rfl:    cyclobenzaprine (FLEXERIL) 5 MG tablet, Take 5 mg by mouth 3 (three) times daily as needed for muscle spasms., Disp: , Rfl:    donepezil (ARICEPT) 10 MG tablet, Take 10 mg by mouth at bedtime., Disp: , Rfl:    gabapentin (NEURONTIN) 300 MG capsule, Take 300 mg by mouth daily., Disp: , Rfl:    levETIRAcetam (KEPPRA) 500 MG tablet, Take 1 tablet (500 mg total) by mouth 2 (two) times daily., Disp: 60 tablet, Rfl: 0   mirtazapine (REMERON) 7.5 MG tablet, Take 7.5 mg by mouth at bedtime., Disp: , Rfl:    Multiple Vitamins-Minerals (MULTIVITAMIN WOMEN 50+) TABS, Take 1 tablet by mouth at bedtime., Disp: , Rfl:    NIFEdipine (PROCARDIA XL/NIFEDICAL-XL) 90 MG 24 hr tablet, Take 90 mg by mouth daily., Disp: , Rfl:    TRESIBA FLEXTOUCH 200 UNIT/ML FlexTouch Pen, Inject 30 Units into the skin in the morning and at bedtime., Disp: , Rfl:   PAST MEDICAL HISTORY: Past Medical History:  Diagnosis Date   Anxiety    Arthritis    "back, arms, legs" (03/24/2016)   Asthma    Chronic lower back pain    Colonic polyp    last colonoscopy done in 2009 with normal results per medical record   Depressive disorder    Gastric polyp    Gout    has taken allopurinol 300mg  once daily in past   Headache    History of hiatal hernia    Hypertension    Migraine    "none in awhile; might have a couple/year" (03/24/2016)   Mixed hyperlipidemia    01/2016 Total chol 141, HDL 59, LDL 63, ration 1.1   Osteoarthritis    TIA (transient ischemic attack) 11/2014    Type II diabetes mellitus (HCC)     PAST SURGICAL HISTORY: Past Surgical History:  Procedure Laterality Date   ARTERY BIOPSY Right 08/12/2016   Procedure: BIOPSY TEMPORAL ARTERY;  Surgeon: Sherren Kerns, MD;  Location: [facility] OR;  Service: Vascular;  Laterality: Right;  BIOPSY TEMPORAL ARTERY   BREAST BIOPSY Left ~ 2015   benign   CARPAL TUNNEL RELEASE Bilateral    DILATION AND CURETTAGE OF UTERUS     I & D EXTREMITY Bilateral 10/24/2022   Procedure: DEBRIDEMENT LEFT LEG AND RIGHT ANKLE;  Surgeon: Nadara Mustard, MD;  Location: [facility] OR;  Service: Orthopedics;  Laterality: Bilateral;   IR CT HEAD LTD  06/11/2022   IR PERCUTANEOUS ART THROMBECTOMY/INFUSION INTRACRANIAL INC DIAG ANGIO  06/11/2022   IR US GUIDE VASC ACCESS RIGHT  06/11/2022   KNEE ARTHROSCOPY Right 2003   in [facility]   LAPAROSCOPIC CHOLECYSTECTOMY     LOOP RECORDER INSERTION N/A 06/26/2022   Procedure:  LOOP RECORDER INSERTION;  Surgeon: Regan Lemming, MD;  Location: [facility] INVASIVE CV LAB;  Service: Cardiovascular;  Laterality: N/A;   RADIOLOGY WITH ANESTHESIA N/A 06/11/2022   Procedure: IR WITH ANESTHESIA;  Surgeon: Radiologist, Medication, MD;  Location: [facility] OR;  Service: Radiology;  Laterality: N/A;   TUBAL LIGATION     VAGINAL HYSTERECTOMY  1982    FAMILY HISTORY: Family History  Problem Relation Age of Onset   Stroke Father    Stroke Brother    Cancer Other    Gout Mother    Hypertension Mother    Arthritis Mother    Asthma Mother    Stroke Brother     SOCIAL HISTORY: Social History   Socioeconomic History   Marital status: Married    Spouse name: Not on file   Number of children: Not on file   Years of education: 12+   Highest education level: Not on file  Occupational History   Not on file  Tobacco Use   Smoking status: Never   Smokeless tobacco: Never  Vaping Use   Vaping status: Never Used  Substance and Sexual Activity   Alcohol use: No   Drug use: No   Sexual activity: Never  Other Topics  Concern   Not on file  Social History Narrative   Right handed   Caffeine use: none   Lives with husband   Recently moved from [place]   Social Drivers of Health   Financial Resource Strain: Not on file  Food Insecurity: No Food Insecurity (06/09/2023)   Hunger Vital Sign    Worried About Running Out of Food in the Last Year: Never true    Ran Out of Food in the Last Year: Never true  Transportation Needs: No Transportation Needs (06/09/2023)   PRAPARE - Administrator, Civil Service (Medical): No    Lack of Transportation (Non-Medical): No  Physical Activity: Not on file  Stress: Not on file  Social Connections: Not on file  Intimate Partner Violence: Not At Risk (06/09/2023)   Humiliation, Afraid, Rape, and Kick questionnaire    Fear of Current or Ex-Partner: No    Emotionally Abused: No    Physically Abused: No    Sexually Abused: No       PHYSICAL EXAM  There were no vitals filed for this visit.  There is no height or weight on file to calculate BMI.   General: The patient is well-developed and well-nourished and in no acute distress  HEENT:  Head is [place]/AT.  Sclera are anicteric.  Funduscopic exam shows normal optic discs and retinal vessels.  Neck: No carotid bruits are noted.  The neck is nontender.  Cardiovascular: The heart has a regular rate and rhythm with a normal S1 and S2. There were no murmurs, gallops or rubs.    Skin: Extremities are without rash or  edema.  Musculoskeletal:  Back is nontender  Neurologic Exam  Mental status: The patient is alert and oriented x 3 at the time of the examination. The patient has apparent normal recent and remote memory, with an apparently normal attention span and concentration ability.   Speech is normal.  Cranial nerves: Extraocular movements are full. Pupils are equal, round, and reactive to light and accomodation.  Visual fields are full.  Facial symmetry is present. There is good facial  sensation to soft touch bilaterally.Facial strength is normal.  Trapezius and sternocleidomastoid strength is normal. No dysarthria is noted.  The tongue is  midline, and the patient has symmetric elevation of the soft palate. No obvious hearing deficits are noted.  Motor:  Muscle bulk is normal.   Tone is normal. Strength is  5 / 5 in all 4 extremities.   Sensory: Sensory testing is intact to pinprick, soft touch and vibration sensation in all 4 extremities.  Coordination: Cerebellar testing reveals good finger-nose-finger and heel-to-shin bilaterally.  Gait and station: Station is normal.   Gait is normal. Tandem gait is normal. Romberg is negative.   Reflexes: Deep tendon reflexes are symmetric and normal bilaterally.   Plantar responses are flexor.    DIAGNOSTIC DATA (LABS, IMAGING, TESTING) - I reviewed patient records, labs, notes, testing and imaging myself where available.  Lab Results  Component Value Date   WBC 5.5 06/10/2023   HGB 11.7 (L) 06/10/2023   HCT 36.8 06/10/2023   MCV 90.6 06/10/2023   PLT 240 06/10/2023      Component Value Date/Time   NA 139 06/10/2023 0554   K 3.7 06/10/2023 0554   CL 110 06/10/2023 0554   CO2 18 (L) 06/10/2023 0554   GLUCOSE 123 (H) 06/10/2023 0554   BUN 6 (L) 06/10/2023 0554   CREATININE 0.83 06/10/2023 0554   CREATININE 0.88 01/25/2016 0001   CALCIUM 9.3 06/10/2023 0554   PROT 8.5 (H) 06/09/2023 0851   ALBUMIN 3.1 (L) 06/09/2023 0851   AST 19 06/09/2023 0851   ALT 10 06/09/2023 0851   ALKPHOS 103 06/09/2023 0851   BILITOT 0.8 06/09/2023 0851   GFRNONAA >60 06/10/2023 0554   GFRAA >60 11/18/2019 0251   Lab Results  Component Value Date   CHOL 136 10/25/2022   HDL 46 10/25/2022   LDLCALC 69 10/25/2022   TRIG 104 10/25/2022   CHOLHDL 3.0 10/25/2022   Lab Results  Component Value Date   HGBA1C 6.4 (H) 06/09/2023   Lab Results  Component Value Date   VITAMINB12 342 02/06/2021   Lab Results  Component Value Date   TSH  1.790 02/27/2020       ASSESSMENT AND PLAN  ***   Kenetha Cozza A. Epimenio Foot, MD, [facility] 07/19/2023, 2:56 PM Certified in Neurology, Clinical Neurophysiology, Sleep Medicine and Neuroimaging  [facility] [address] [place], [place] [address] [phone]

## 2023-07-20 ENCOUNTER — Institutional Professional Consult (permissible substitution): Payer: Medicare PPO | Admitting: Neurology

## 2023-08-12 ENCOUNTER — Ambulatory Visit: Payer: Medicare PPO | Admitting: Family

## 2023-08-18 NOTE — Progress Notes (Deleted)
 [facility]  PATIENT: [name] DOB: 1951-04-27  REFERRING DOCTOR OR PCP:  *** SOURCE: ***  _________________________________   HISTORICAL  CHIEF COMPLAINT:  No chief complaint on file.   HISTORY OF PRESENT ILLNESS:  ***  She is a 73 year old woman who had a right MCA distribution stroke 06/12/2022 who presented to the emergency room on 06/11/2023 for code stroke after waking up unable to talk and feeling that her tongue was swollen.  She woke up her husband and was noted to have difficulty with her speech.  They called EMS and she was brought to the emergency room.  CT scan did not show any acute findings.  While in the emergency room, she was noted to exhibit rhythmic contractions of the left side of her face with worsening dysarthria and drooling lasting 2 minutes.  She was given 2 mg IV Ativan and placed on levetiracetam 500 mg po bid.  While in the hospital, she also had long-term EEG that did not show any epileptiform activity.  Due to her known stroke and symptoms, she was felt to have a focal seizure.   I have previously seen her 02/27/2020 for memory loss.  At that time, the CT scan appeared normal for age  Imaging:  MRI of the brain 06/09/2023 showed a chronic cortical/subcortical right MCA infarction involving the right frontal parietal operculum/insula.  Chronic hemosiderin deposition and cortical laminar necrosis was noted.  Additionally, there are T2/FLAIR hyperintense foci involving the cerebral hemispheres and pons consistent with chronic microvascular ischemic changes.  The medial temporal lobes appear normal.  There were no acute findings.     The right MCA stroke was noted to be acute on the 06/12/2022 MRI.    REVIEW OF SYSTEMS: Constitutional: No fevers, chills, sweats, or change in appetite Eyes: No visual changes, double vision, eye pain Ear, nose and throat: No hearing loss, ear pain, nasal congestion, sore throat Cardiovascular: No  chest pain, palpitations Respiratory:  No shortness of breath at rest or with exertion.   No wheezes GastrointestinaI: No nausea, vomiting, diarrhea, abdominal pain, fecal incontinence Genitourinary:  No dysuria, urinary retention or frequency.  No nocturia. Musculoskeletal:  No neck pain, back pain Integumentary: No rash, pruritus, skin lesions Neurological: as above Psychiatric: No depression at this time.  No anxiety Endocrine: No palpitations, diaphoresis, change in appetite, change in weigh or increased thirst Hematologic/Lymphatic:  No anemia, purpura, petechiae. Allergic/Immunologic: No itchy/runny eyes, nasal congestion, recent allergic reactions, rashes  ALLERGIES: Allergies  Allergen Reactions   Ace Inhibitors Swelling   Latex Itching and Swelling   Ultram [Tramadol] Nausea And Vomiting   Zanaflex [Tizanidine] Other (See Comments)    Tremors    Diprivan [Propofol] Itching   Metformin And Related Other (See Comments)    Tremors  Chills    HOME MEDICATIONS:  Current Outpatient Medications:    acetaminophen (TYLENOL) 500 MG tablet, Take 1,000 mg by mouth every 6 (six) hours as needed for moderate pain (pain score 4-6) or mild pain (pain score 1-3)., Disp: , Rfl:    albuterol (VENTOLIN HFA) 108 (90 Base) MCG/ACT inhaler, Inhale 2 puffs into the lungs every 6 (six) hours as needed for wheezing or shortness of breath., Disp: , Rfl:    allopurinol (ZYLOPRIM) 100 MG tablet, Take 1 tablet (100 mg total) by mouth daily., Disp: 30 tablet, Rfl: 0   aspirin EC 81 MG tablet, Take 81 mg by mouth daily. Swallow whole., Disp: , Rfl:    atenolol (  TENORMIN) 25 MG tablet, Take 25-50 mg by mouth See admin instructions. Take 50 mg (2 tablets) every morning and 25 mg (1 tablet) every evening., Disp: , Rfl:    atorvastatin (LIPITOR) 20 MG tablet, Take 1 tablet (20 mg total) by mouth at bedtime., Disp: 30 tablet, Rfl: 0   budesonide-formoterol (SYMBICORT) 160-4.5 MCG/ACT inhaler, Inhale 2 puffs  into the lungs daily., Disp: , Rfl:    cyclobenzaprine (FLEXERIL) 5 MG tablet, Take 5 mg by mouth 3 (three) times daily as needed for muscle spasms., Disp: , Rfl:    donepezil (ARICEPT) 10 MG tablet, Take 10 mg by mouth at bedtime., Disp: , Rfl:    gabapentin (NEURONTIN) 300 MG capsule, Take 300 mg by mouth daily., Disp: , Rfl:    levETIRAcetam (KEPPRA) 500 MG tablet, Take 1 tablet (500 mg total) by mouth 2 (two) times daily., Disp: 60 tablet, Rfl: 0   mirtazapine (REMERON) 7.5 MG tablet, Take 7.5 mg by mouth at bedtime., Disp: , Rfl:    Multiple Vitamins-Minerals (MULTIVITAMIN WOMEN 50+) TABS, Take 1 tablet by mouth at bedtime., Disp: , Rfl:    NIFEdipine (PROCARDIA XL/NIFEDICAL-XL) 90 MG 24 hr tablet, Take 90 mg by mouth daily., Disp: , Rfl:    TRESIBA FLEXTOUCH 200 UNIT/ML FlexTouch Pen, Inject 30 Units into the skin in the morning and at bedtime., Disp: , Rfl:   PAST MEDICAL HISTORY: Past Medical History:  Diagnosis Date   Anxiety    Arthritis    "back, arms, legs" (03/24/2016)   Asthma    Chronic lower back pain    Colonic polyp    last colonoscopy done in 2009 with normal results per medical record   Depressive disorder    Gastric polyp    Gout    has taken allopurinol 300mg  once daily in past   Headache    History of hiatal hernia    Hypertension    Migraine    "none in awhile; might have a couple/year" (03/24/2016)   Mixed hyperlipidemia    01/2016 Total chol 141, HDL 59, LDL 63, ration 1.1   Osteoarthritis    TIA (transient ischemic attack) 11/2014   Type II diabetes mellitus (HCC)     PAST SURGICAL HISTORY: Past Surgical History:  Procedure Laterality Date   ARTERY BIOPSY Right 08/12/2016   Procedure: BIOPSY TEMPORAL ARTERY;  Surgeon: Sherren Kerns, MD;  Location: [facility] OR;  Service: Vascular;  Laterality: Right;  BIOPSY TEMPORAL ARTERY   BREAST BIOPSY Left ~ 2015   benign   CARPAL TUNNEL RELEASE Bilateral    DILATION AND CURETTAGE OF UTERUS     I & D EXTREMITY  Bilateral 10/24/2022   Procedure: DEBRIDEMENT LEFT LEG AND RIGHT ANKLE;  Surgeon: Nadara Mustard, MD;  Location: [facility] OR;  Service: Orthopedics;  Laterality: Bilateral;   IR CT HEAD LTD  06/11/2022   IR PERCUTANEOUS ART THROMBECTOMY/INFUSION INTRACRANIAL INC DIAG ANGIO  06/11/2022   IR US GUIDE VASC ACCESS RIGHT  06/11/2022   KNEE ARTHROSCOPY Right 2003   in [facility]   LAPAROSCOPIC CHOLECYSTECTOMY     LOOP RECORDER INSERTION N/A 06/26/2022   Procedure: LOOP RECORDER INSERTION;  Surgeon: Regan Lemming, MD;  Location: [facility] INVASIVE CV LAB;  Service: Cardiovascular;  Laterality: N/A;   RADIOLOGY WITH ANESTHESIA N/A 06/11/2022   Procedure: IR WITH ANESTHESIA;  Surgeon: Radiologist, Medication, MD;  Location: [facility] OR;  Service: Radiology;  Laterality: N/A;   TUBAL LIGATION     VAGINAL HYSTERECTOMY  1982  FAMILY HISTORY: Family History  Problem Relation Age of Onset   Stroke Father    Stroke Brother    Cancer Other    Gout Mother    Hypertension Mother    Arthritis Mother    Asthma Mother    Stroke Brother     SOCIAL HISTORY: Social History   Socioeconomic History   Marital status: Married    Spouse name: Not on file   Number of children: Not on file   Years of education: 12+   Highest education level: Not on file  Occupational History   Not on file  Tobacco Use   Smoking status: Never   Smokeless tobacco: Never  Vaping Use   Vaping status: Never Used  Substance and Sexual Activity   Alcohol use: No   Drug use: No   Sexual activity: Never  Other Topics Concern   Not on file  Social History Narrative   Right handed   Caffeine use: none   Lives with husband   Recently moved from [place]   Social Drivers of Health   Financial Resource Strain: Not on file  Food Insecurity: No Food Insecurity (06/09/2023)   Hunger Vital Sign    Worried About Running Out of Food in the Last Year: Never true    Ran Out of Food in the Last Year: Never true  Transportation Needs: No  Transportation Needs (06/09/2023)   PRAPARE - Administrator, Civil Service (Medical): No    Lack of Transportation (Non-Medical): No  Physical Activity: Not on file  Stress: Not on file  Social Connections: Not on file  Intimate Partner Violence: Not At Risk (06/09/2023)   Humiliation, Afraid, Rape, and Kick questionnaire    Fear of Current or Ex-Partner: No    Emotionally Abused: No    Physically Abused: No    Sexually Abused: No       PHYSICAL EXAM  There were no vitals filed for this visit.  There is no height or weight on file to calculate BMI.   General: The patient is well-developed and well-nourished and in no acute distress  HEENT:  Head is [place]/AT.  Sclera are anicteric.  Funduscopic exam shows normal optic discs and retinal vessels.  Neck: No carotid bruits are noted.  The neck is nontender.  Cardiovascular: The heart has a regular rate and rhythm with a normal S1 and S2. There were no murmurs, gallops or rubs.    Skin: Extremities are without rash or  edema.  Musculoskeletal:  Back is nontender  Neurologic Exam  Mental status: The patient is alert and oriented x 3 at the time of the examination. The patient has apparent normal recent and remote memory, with an apparently normal attention span and concentration ability.   Speech is normal.  Cranial nerves: Extraocular movements are full. Pupils are equal, round, and reactive to light and accomodation.  Visual fields are full.  Facial symmetry is present. There is good facial sensation to soft touch bilaterally.Facial strength is normal.  Trapezius and sternocleidomastoid strength is normal. No dysarthria is noted.  The tongue is midline, and the patient has symmetric elevation of the soft palate. No obvious hearing deficits are noted.  Motor:  Muscle bulk is normal.   Tone is normal. Strength is  5 / 5 in all 4 extremities.   Sensory: Sensory testing is intact to pinprick, soft touch and vibration  sensation in all 4 extremities.  Coordination: Cerebellar testing reveals good finger-nose-finger  and heel-to-shin bilaterally.  Gait and station: Station is normal.   Gait is normal. Tandem gait is normal. Romberg is negative.   Reflexes: Deep tendon reflexes are symmetric and normal bilaterally.   Plantar responses are flexor.    DIAGNOSTIC DATA (LABS, IMAGING, TESTING) - I reviewed patient records, labs, notes, testing and imaging myself where available.  Lab Results  Component Value Date   WBC 5.5 06/10/2023   HGB 11.7 (L) 06/10/2023   HCT 36.8 06/10/2023   MCV 90.6 06/10/2023   PLT 240 06/10/2023      Component Value Date/Time   NA 139 06/10/2023 0554   K 3.7 06/10/2023 0554   CL 110 06/10/2023 0554   CO2 18 (L) 06/10/2023 0554   GLUCOSE 123 (H) 06/10/2023 0554   BUN 6 (L) 06/10/2023 0554   CREATININE 0.83 06/10/2023 0554   CREATININE 0.88 01/25/2016 0001   CALCIUM 9.3 06/10/2023 0554   PROT 8.5 (H) 06/09/2023 0851   ALBUMIN 3.1 (L) 06/09/2023 0851   AST 19 06/09/2023 0851   ALT 10 06/09/2023 0851   ALKPHOS 103 06/09/2023 0851   BILITOT 0.8 06/09/2023 0851   GFRNONAA >60 06/10/2023 0554   GFRAA >60 11/18/2019 0251   Lab Results  Component Value Date   CHOL 136 10/25/2022   HDL 46 10/25/2022   LDLCALC 69 10/25/2022   TRIG 104 10/25/2022   CHOLHDL 3.0 10/25/2022   Lab Results  Component Value Date   HGBA1C 6.4 (H) 06/09/2023   Lab Results  Component Value Date   VITAMINB12 342 02/06/2021   Lab Results  Component Value Date   TSH 1.790 02/27/2020       ASSESSMENT AND PLAN  ***   Olie Scaffidi A. Epimenio Foot, MD, [facility] 08/18/2023, 8:21 PM Certified in Neurology, Clinical Neurophysiology, Sleep Medicine and Neuroimaging  [facility] [address], [address] [place], [place] [address] [phone]

## 2023-08-19 ENCOUNTER — Institutional Professional Consult (permissible substitution): Payer: Medicare PPO | Admitting: Neurology

## 2023-08-19 ENCOUNTER — Ambulatory Visit: Payer: Medicare PPO | Admitting: Family

## 2023-09-02 ENCOUNTER — Ambulatory Visit (INDEPENDENT_AMBULATORY_CARE_PROVIDER_SITE_OTHER): Admitting: Family

## 2023-09-02 ENCOUNTER — Encounter: Payer: Self-pay | Admitting: Family

## 2023-09-02 DIAGNOSIS — S81801D Unspecified open wound, right lower leg, subsequent encounter: Secondary | ICD-10-CM

## 2023-09-02 DIAGNOSIS — I872 Venous insufficiency (chronic) (peripheral): Secondary | ICD-10-CM

## 2023-09-02 DIAGNOSIS — I890 Lymphedema, not elsewhere classified: Secondary | ICD-10-CM

## 2023-09-02 DIAGNOSIS — L97921 Non-pressure chronic ulcer of unspecified part of left lower leg limited to breakdown of skin: Secondary | ICD-10-CM

## 2023-09-02 MED ORDER — DOXYCYCLINE HYCLATE 100 MG PO TABS: 100.0000 mg | ORAL_TABLET | Freq: Two times a day (BID) | ORAL | 0 refills | Status: DC

## 2023-09-02 NOTE — Progress Notes (Signed)
 Office Visit Note   Patient: [name]           Date of Birth: [birth year]           MRN: [id number] Visit Date: 09/02/2023              Requested by: Hillery Aldo, NP [address] [place],  [place] [address] PCP: Hillery Aldo, NP  Chief Complaint  Patient presents with   Right Leg - Follow-up   Left Leg - Follow-up      HPI: The patient is a 73 year old woman who presents in follow-up for ongoing management of her chronic bilateral lower extremity edema and related ulcerations she was last seen January 29 of this year.  Does have some assistance from [place] however she and her husband provide the majority of her wound care she has previously been unable to tolerate compression or elevation No fever no chills.  She is concerned about some increased pain and increased wound size on the right Assessment & Plan: Visit Diagnoses: No diagnosis found.  Plan: We just may concern for cellulitis will place on a course of doxycycline and have also referred back to vascular ABIs were performed in May of last year and showed: Summary:  Right: Resting right ankle-brachial index indicates noncompressible right  lower extremity arteries. The right toe-brachial index is normal.   Left: Resting left ankle-brachial index is within normal range. The left  toe-brachial index is near normal.   Continue with home health nursing care cleansing daily  Discussed importance of compression.  Patient reports his unable to tolerate. Follow-Up Instructions: Return in about 6 weeks (around 10/15/2023).   Ortho Exam  Patient is alert, oriented, no adenopathy, well-dressed, normal affect, normal respiratory effort. On examination bilateral lower extremities there is trace edema with hemosiderin staining dry peeling skin the left foot is somewhat improved there is maceration over the dorsum measuring a 9 cm x 6 cm with 1 mm of depth there is a proximal foot dorsum ulcer measuring 3 cm x 2 cm with 5  mm of depth filled in with granulation there is no drainage On examination right lower extremity erythema and warmth up to the mid shin. she has dorsal foot ulceration measuring 6 cm x 7-1/2 cm with 2 mm of depth she feels this is increased in size filled in with nearly full granulation there is no active drainage.  There is surrounding maceration.  Imaging: No results found. No images are attached to the encounter.  Labs: Lab Results  Component Value Date   HGBA1C 6.4 (H) 06/09/2023   HGBA1C 8.1 (H) 10/23/2022   HGBA1C 9.5 (H) 06/11/2022   ESRSEDRATE 110 (H) 05/20/2023   ESRSEDRATE 90 (H) 10/23/2022   ESRSEDRATE 117 (H) 03/06/2021   CRP 4.7 (H) 05/20/2023   CRP 3.8 (H) 10/23/2022   CRP 43.7 (H) 03/06/2021   LABURIC 7.7 (H) 05/20/2023   REPTSTATUS 05/24/2023 FINAL 05/19/2023   GRAMSTAIN  10/24/2022    ABUNDANT SQUAMOUS EPITHELIAL CELLS PRESENT FEW WBC PRESENT, PREDOMINANTLY PMN MODERATE GRAM POSITIVE RODS MODERATE GRAM POSITIVE COCCI FEW GRAM NEGATIVE RODS    CULT  05/19/2023    NO GROWTH 5 DAYS Performed at [facility], [address] [place], [place] [address]    LABORGA PROTEUS MIRABILIS 10/24/2022   LABORGA STAPHYLOCOCCUS AUREUS 10/24/2022   LABORGA PSEUDOMONAS AERUGINOSA 10/24/2022     Lab Results  Component Value Date   ALBUMIN 3.1 (L) 06/09/2023   ALBUMIN 2.7 (L)  05/19/2023   ALBUMIN 2.7 (L) 02/19/2023   PREALBUMIN 12.5 (L) 03/16/2017    Lab Results  Component Value Date   MG 2.1 05/20/2023   MG 2.1 06/27/2022   MG 1.9 06/25/2022   No results found for: "VD25OH"  Lab Results  Component Value Date   PREALBUMIN 12.5 (L) 03/16/2017      Latest Ref Rng & Units 06/10/2023    5:54 AM 06/09/2023    9:07 AM 06/09/2023    8:51 AM  CBC EXTENDED  WBC 4.0 - 10.5 K/uL 5.5   6.8   RBC 3.87 - 5.11 MIL/uL 4.06   4.29   Hemoglobin 12.0 - 15.0 g/dL 73.2  20.2  54.2   HCT 36.0 - 46.0 % 36.8  38.0  39.1   Platelets 150 - 400 K/uL 240   335   NEUT# 1.7 -  7.7 K/uL   4.3   Lymph# 0.7 - 4.0 K/uL   1.8      There is no height or weight on file to calculate BMI.  Orders:  No orders of the defined types were placed in this encounter.  No orders of the defined types were placed in this encounter.    Procedures: No procedures performed  Clinical Data: No additional findings.  ROS:  All other systems negative, except as noted in the HPI. Review of Systems  Objective: Vital Signs: There were no vitals taken for this visit.  Specialty Comments:  No specialty comments available.  PMFS History: Patient Active Problem List   Diagnosis Date Noted   Seizure (HCC) 06/10/2023   Stroke-like symptoms 06/09/2023   Complex partial seizure (HCC) 06/09/2023   Ulcer of extremity due to chronic venous insufficiency (HCC) 02/19/2023   Lymphedema 02/19/2023   SBO (small bowel obstruction) (HCC) 02/18/2023   GAD (generalized anxiety disorder) 02/18/2023   Asthma, chronic 02/18/2023   Diabetic ulcer of left lower leg (HCC) 10/24/2022   Chronic venous hypertension (idiopathic) with ulcer and inflammation of bilateral lower extremity (HCC) 10/24/2022   Non-healing wound of lower extremity 10/23/2022   Bacteria in urine 06/26/2022   Stasis ulcer of lower extremity (HCC) 06/26/2022   Elevated troponin 06/25/2022   Abnormal urinalysis 06/25/2022   History of CVA (cerebrovascular accident) 06/25/2022   Memory loss 06/25/2022   Cryptogenic stroke (HCC) 06/11/2022   Middle cerebral artery embolism, 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   Wound infection 02/05/2021   Venous stasis dermatitis of both lower extremities    Pneumonia due to COVID-19 virus 06/29/2019   Hypophosphatemia 06/24/2019   Obesity 06/23/2019   Insulin dependent type 2 diabetes mellitus (HCC) 06/22/2019   Anxiety 06/22/2019   Essential hypertension 06/22/2019   Hyperlipidemia 06/22/2019   COVID-19 06/21/2019   Severe protein-calorie  malnutrition (HCC) 06/21/2019   Acute respiratory failure with hypoxia (HCC) 06/21/2019   Diabetic foot ulcer associated with type 2 diabetes mellitus (HCC)    Cellulitis 07/05/2016   Depression 05/30/2016   Lower extremity ulceration (HCC) 05/30/2016   Generalized weakness 05/30/2016   Left arm weakness 05/30/2016   Left Lower extremity pain  04/02/2016   Lower extremity pain, inferior, left 04/02/2016   Left leg pain    Personal history of noncompliance with medical treatment, presenting hazards to health 03/31/2016   Jaw pain 03/27/2016   Left leg cellulitis 03/24/2016   Cellulitis of left lower extremity    Primary insomnia    Adjustment disorder    Morbid obesity (HCC) 01/27/2016  Wheelchair dependent 01/27/2016   Gout 01/27/2016   Arthritis 01/27/2016   Uncontrolled type 2 diabetes mellitus with hypoglycemia, with long-term current use of insulin (HCC) 01/25/2016   Cellulitis of left leg 01/11/2016   Insulin-requiring or dependent type II diabetes mellitus (HCC) 01/11/2016   Past Medical History:  Diagnosis Date   Anxiety    Arthritis    "back, arms, legs" (03/24/2016)   Asthma    Chronic lower back pain    Colonic polyp    last colonoscopy done in 2009 with normal results per medical record   Depressive disorder    Gastric polyp    Gout    has taken allopurinol 300mg  once daily in past   Headache    History of hiatal hernia    Hypertension    Migraine    "none in awhile; might have a couple/year" (03/24/2016)   Mixed hyperlipidemia    01/2016 Total chol 141, HDL 59, LDL 63, ration 1.1   Osteoarthritis    TIA (transient ischemic attack) 11/2014   Type II diabetes mellitus (HCC)     Family History  Problem Relation Age of Onset   Stroke Father    Stroke Brother    Cancer Other    Gout Mother    Hypertension Mother    Arthritis Mother    Asthma Mother    Stroke Brother     Past Surgical History:  Procedure Laterality Date   ARTERY BIOPSY Right  08/12/2016   Procedure: BIOPSY TEMPORAL ARTERY;  Surgeon: Sherren Kerns, MD;  Location: [facility] OR;  Service: Vascular;  Laterality: Right;  BIOPSY TEMPORAL ARTERY   BREAST BIOPSY Left ~ 2015   benign   CARPAL TUNNEL RELEASE Bilateral    DILATION AND CURETTAGE OF UTERUS     I & D EXTREMITY Bilateral 10/24/2022   Procedure: DEBRIDEMENT LEFT LEG AND RIGHT ANKLE;  Surgeon: Nadara Mustard, MD;  Location: [facility] OR;  Service: Orthopedics;  Laterality: Bilateral;   IR CT HEAD LTD  06/11/2022   IR PERCUTANEOUS ART THROMBECTOMY/INFUSION INTRACRANIAL INC DIAG ANGIO  06/11/2022   IR US GUIDE VASC ACCESS RIGHT  06/11/2022   KNEE ARTHROSCOPY Right 2003   in [facility]   LAPAROSCOPIC CHOLECYSTECTOMY     LOOP RECORDER INSERTION N/A 06/26/2022   Procedure: LOOP RECORDER INSERTION;  Surgeon: Regan Lemming, MD;  Location: [facility] IN[facility];  Service: Cardiovascular;  Laterality: N/A;   RADIOLOGY WITH ANESTHESIA N/A 06/11/2022   Procedure: IR WITH ANESTHESIA;  Surgeon: Radiologist, Medication, MD;  Location: [facility] OR;  Service: Radiology;  Laterality: N/A;   TUBAL LIGATION     VAGINAL HYSTERECTOMY  1982   Social History   Occupational History   Not on file  Tobacco Use   Smoking status: Never   Smokeless tobacco: Never  Vaping Use   Vaping status: Never Used  Substance and Sexual Activity   Alcohol use: No   Drug use: No   Sexual activity: Never

## 2023-09-13 NOTE — Telephone Encounter (Signed)
error 

## 2023-09-22 ENCOUNTER — Ambulatory Visit (INDEPENDENT_AMBULATORY_CARE_PROVIDER_SITE_OTHER): Admitting: Family

## 2023-09-22 ENCOUNTER — Encounter: Payer: Self-pay | Admitting: Family

## 2023-09-22 ENCOUNTER — Telehealth: Payer: Self-pay | Admitting: Family

## 2023-09-22 DIAGNOSIS — I872 Venous insufficiency (chronic) (peripheral): Secondary | ICD-10-CM | POA: Diagnosis not present

## 2023-09-22 DIAGNOSIS — S81801D Unspecified open wound, right lower leg, subsequent encounter: Secondary | ICD-10-CM | POA: Diagnosis not present

## 2023-09-22 DIAGNOSIS — L97511 Non-pressure chronic ulcer of other part of right foot limited to breakdown of skin: Secondary | ICD-10-CM

## 2023-09-22 DIAGNOSIS — I890 Lymphedema, not elsewhere classified: Secondary | ICD-10-CM

## 2023-09-22 NOTE — Progress Notes (Signed)
 Office Visit Note   Patient: [name]           Date of Birth: 1950/11/16           MRN: [id number] Visit Date: 09/22/2023              Requested by: Hillery Aldo, NP [address] [place],  [place] [address] PCP: Hillery Aldo, NP  Chief Complaint  Patient presents with   Right Leg - Wound Check   Left Leg - Wound Check      HPI: The patient is a 73 year old woman who presents in follow-up for ongoing management of her chronic bilateral lower extremity edema and related ulcerations she was last seen January 29 of this year.  Does have some assistance from [place].   however she and her husband provide the majority of her wound care she has previously been unable to tolerate compression or elevation  Reports they are using Xeroform and gauze and Kerlix for dressings.  Reports her husband recently found her lymphedema pumps in the closet she is wondering if she should resume using these No fever no chills.      Assessment & Plan: Visit Diagnoses: No diagnosis found.  Plan: Continue with home health nursing care cleansing daily.  Continuing with dressing changes elevation encouraged to resume her lymphedema pumps  Discussed importance of compression.  Patient reports is unable to tolerate.  Follow-Up Instructions: Return in about 6 weeks (around 11/03/2023).   Ortho Exam  Patient is alert, oriented, no adenopathy, well-dressed, normal affect, normal respiratory effort. On examination bilateral lower extremities there is trace edema with hemosiderin staining dry peeling skin the left foot is somewhat improved there is maceration over the dorsum measuring a 7 cm x 4  cm with 1 mm of depth there is a proximal foot dorsum ulcer measuring 2 cm x 1 cm with 3mm of depth filled in with granulation there is no drainage On examination right lower extremity erythema and warmth up to the mid shin. she has dorsal foot ulceration measuring 6 cm x 4 cm with 2 mm of depth she feels  this is increased in size filled in with nearly full granulation there is no active drainage.  There is surrounding maceration.  Imaging: No results found. No images are attached to the encounter.  Labs: Lab Results  Component Value Date   HGBA1C 6.4 (H) 06/09/2023   HGBA1C 8.1 (H) 10/23/2022   HGBA1C 9.5 (H) 06/11/2022   ESRSEDRATE 110 (H) 05/20/2023   ESRSEDRATE 90 (H) 10/23/2022   ESRSEDRATE 117 (H) 03/06/2021   CRP 4.7 (H) 05/20/2023   CRP 3.8 (H) 10/23/2022   CRP 43.7 (H) 03/06/2021   LABURIC 7.7 (H) 05/20/2023   REPTSTATUS 05/24/2023 FINAL 05/19/2023   GRAMSTAIN  10/24/2022    ABUNDANT SQUAMOUS EPITHELIAL CELLS PRESENT FEW WBC PRESENT, PREDOMINANTLY PMN MODERATE GRAM POSITIVE RODS MODERATE GRAM POSITIVE COCCI FEW GRAM NEGATIVE RODS    CULT  05/19/2023    NO GROWTH 5 DAYS Performed at [facility], [address] [place], [place] [address]    LABORGA PROTEUS MIRABILIS 10/24/2022   LABORGA STAPHYLOCOCCUS AUREUS 10/24/2022   LABORGA PSEUDOMONAS AERUGINOSA 10/24/2022     Lab Results  Component Value Date   ALBUMIN 3.1 (L) 06/09/2023   ALBUMIN 2.7 (L) 05/19/2023   ALBUMIN 2.7 (L) 02/19/2023   PREALBUMIN 12.5 (L) 03/16/2017    Lab Results  Component Value Date   MG 2.1 05/20/2023   MG 2.1  06/27/2022   MG 1.9 06/25/2022   No results found for: "VD25OH"  Lab Results  Component Value Date   PREALBUMIN 12.5 (L) 03/16/2017      Latest Ref Rng & Units 06/10/2023    5:54 AM 06/09/2023    9:07 AM 06/09/2023    8:51 AM  CBC EXTENDED  WBC 4.0 - 10.5 K/uL 5.5   6.8   RBC 3.87 - 5.11 MIL/uL 4.06   4.29   Hemoglobin 12.0 - 15.0 g/dL 09.8  11.9  14.7   HCT 36.0 - 46.0 % 36.8  38.0  39.1   Platelets 150 - 400 K/uL 240   335   NEUT# 1.7 - 7.7 K/uL   4.3   Lymph# 0.7 - 4.0 K/uL   1.8      There is no height or weight on file to calculate BMI.  Orders:  No orders of the defined types were placed in this encounter.  No orders of the defined types were  placed in this encounter.    Procedures: No procedures performed  Clinical Data: No additional findings.  ROS:  All other systems negative, except as noted in the HPI. Review of Systems  Objective: Vital Signs: There were no vitals taken for this visit.  Specialty Comments:  No specialty comments available.  PMFS History: Patient Active Problem List   Diagnosis Date Noted   Seizure (HCC) 06/10/2023   Stroke-like symptoms 06/09/2023   Complex partial seizure (HCC) 06/09/2023   Ulcer of extremity due to chronic venous insufficiency (HCC) 02/19/2023   Lymphedema 02/19/2023   SBO (small bowel obstruction) (HCC) 02/18/2023   GAD (generalized anxiety disorder) 02/18/2023   Asthma, chronic 02/18/2023   Diabetic ulcer of left lower leg (HCC) 10/24/2022   Chronic venous hypertension (idiopathic) with ulcer and inflammation of bilateral lower extremity (HCC) 10/24/2022   Non-healing wound of lower extremity 10/23/2022   Bacteria in urine 06/26/2022   Stasis ulcer of lower extremity (HCC) 06/26/2022   Elevated troponin 06/25/2022   Abnormal urinalysis 06/25/2022   History of CVA (cerebrovascular accident) 06/25/2022   Memory loss 06/25/2022   Cryptogenic stroke (HCC) 06/11/2022   Middle cerebral artery embolism, 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   Wound infection 02/05/2021   Venous stasis dermatitis of both lower extremities    Pneumonia due to COVID-19 virus 06/29/2019   Hypophosphatemia 06/24/2019   Obesity 06/23/2019   Insulin dependent type 2 diabetes mellitus (HCC) 06/22/2019   Anxiety 06/22/2019   Essential hypertension 06/22/2019   Hyperlipidemia 06/22/2019   COVID-19 06/21/2019   Severe protein-calorie malnutrition (HCC) 06/21/2019   Acute respiratory failure with hypoxia (HCC) 06/21/2019   Diabetic foot ulcer associated with type 2 diabetes mellitus (HCC)    Cellulitis 07/05/2016   Depression 05/30/2016   Lower extremity  ulceration (HCC) 05/30/2016   Generalized weakness 05/30/2016   Left arm weakness 05/30/2016   Left Lower extremity pain  04/02/2016   Lower extremity pain, inferior, left 04/02/2016   Left leg pain    Personal history of noncompliance with medical treatment, presenting hazards to health 03/31/2016   Jaw pain 03/27/2016   Left leg cellulitis 03/24/2016   Cellulitis of left lower extremity    Primary insomnia    Adjustment disorder    Morbid obesity (HCC) 01/27/2016   Wheelchair dependent 01/27/2016   Gout 01/27/2016   Arthritis 01/27/2016   Uncontrolled type 2 diabetes mellitus with hypoglycemia, with long-term current use of insulin (HCC) 01/25/2016  Cellulitis of left leg 01/11/2016   Insulin-requiring or dependent type II diabetes mellitus (HCC) 01/11/2016   Past Medical History:  Diagnosis Date   Anxiety    Arthritis    "back, arms, legs" (03/24/2016)   Asthma    Chronic lower back pain    Colonic polyp    last colonoscopy done in 2009 with normal results per medical record   Depressive disorder    Gastric polyp    Gout    has taken allopurinol 300mg  once daily in past   Headache    History of hiatal hernia    Hypertension    Migraine    "none in awhile; might have a couple/year" (03/24/2016)   Mixed hyperlipidemia    01/2016 Total chol 141, HDL 59, LDL 63, ration 1.1   Osteoarthritis    TIA (transient ischemic attack) 11/2014   Type II diabetes mellitus (HCC)     Family History  Problem Relation Age of Onset   Stroke Father    Stroke Brother    Cancer Other    Gout Mother    Hypertension Mother    Arthritis Mother    Asthma Mother    Stroke Brother     Past Surgical History:  Procedure Laterality Date   ARTERY BIOPSY Right 08/12/2016   Procedure: BIOPSY TEMPORAL ARTERY;  Surgeon: Sherren Kerns, MD;  Location: [facility];  Service: Vascular;  Laterality: Right;  BIOPSY TEMPORAL ARTERY   BREAST BIOPSY Left ~ 2015   benign   CARPAL TUNNEL RELEASE Bilateral     DILATION AND CURETTAGE OF UTERUS     I & D EXTREMITY Bilateral 10/24/2022   Procedure: DEBRIDEMENT LEFT LEG AND RIGHT ANKLE;  Surgeon: Nadara Mustard, MD;  Location: [facility];  Service: Orthopedics;  Laterality: Bilateral;   IR CT HEAD LTD  06/11/2022   IR PERCUTANEOUS ART THROMBECTOMY/INFUSION INTRACRANIAL INC DIAG ANGIO  06/11/2022   IR US GUIDE VASC ACCESS RIGHT  06/11/2022   KNEE ARTHROSCOPY Right 2003   in [facility]   LAPAROSCOPIC CHOLECYSTECTOMY     LOOP RECORDER INSERTION N/A 06/26/2022   Procedure: LOOP RECORDER INSERTION;  Surgeon: Regan Lemming, MD;  Location: [facility];  Service: Cardiovascular;  Laterality: N/A;   RADIOLOGY WITH ANESTHESIA N/A 06/11/2022   Procedure: IR WITH ANESTHESIA;  Surgeon: Radiologist, Medication, MD;  Location: [facility];  Service: Radiology;  Laterality: N/A;   TUBAL LIGATION     VAGINAL HYSTERECTOMY  1982   Social History   Occupational History   Not on file  Tobacco Use   Smoking status: Never   Smokeless tobacco: Never  Vaping Use   Vaping status: Never Used  Substance and Sexual Activity   Alcohol use: No   Drug use: No   Sexual activity: Never

## 2023-09-22 NOTE — Telephone Encounter (Signed)
 [name] from [facility] called and needs verbal order for today and also needs verbal orders to continue her wound care. To see her weekly and fax her orders for today visit. Fax#[phone] CB#[phone]

## 2023-09-23 ENCOUNTER — Ambulatory Visit: Admitting: Family

## 2023-09-24 NOTE — Telephone Encounter (Signed)
 April notified of home health orders, see OV note.

## 2023-10-14 ENCOUNTER — Ambulatory Visit (INDEPENDENT_AMBULATORY_CARE_PROVIDER_SITE_OTHER): Admitting: Family

## 2023-10-14 ENCOUNTER — Encounter: Payer: Self-pay | Admitting: Family

## 2023-10-14 DIAGNOSIS — E11622 Type 2 diabetes mellitus with other skin ulcer: Secondary | ICD-10-CM | POA: Diagnosis not present

## 2023-10-14 DIAGNOSIS — L97401 Non-pressure chronic ulcer of unspecified heel and midfoot limited to breakdown of skin: Secondary | ICD-10-CM

## 2023-10-14 DIAGNOSIS — L97929 Non-pressure chronic ulcer of unspecified part of left lower leg with unspecified severity: Secondary | ICD-10-CM

## 2023-10-14 DIAGNOSIS — E11621 Type 2 diabetes mellitus with foot ulcer: Secondary | ICD-10-CM | POA: Diagnosis not present

## 2023-10-14 DIAGNOSIS — L97901 Non-pressure chronic ulcer of unspecified part of unspecified lower leg limited to breakdown of skin: Secondary | ICD-10-CM

## 2023-10-14 DIAGNOSIS — I890 Lymphedema, not elsewhere classified: Secondary | ICD-10-CM | POA: Diagnosis not present

## 2023-10-14 DIAGNOSIS — I87333 Chronic venous hypertension (idiopathic) with ulcer and inflammation of bilateral lower extremity: Secondary | ICD-10-CM

## 2023-10-14 NOTE — Progress Notes (Signed)
 Office Visit Note   Patient: [name]           Date of Birth: 05/04/1951           MRN: [id number] Visit Date: 10/14/2023              Requested by: Maryellen Snare, NP [address] [place],  [place] [address] PCP: Maryellen Snare, NP  No chief complaint on file.     HPI: The patient is a 73 year old woman who presents in follow-up for ongoing management of her chronic bilateral lower extremity ulcers and venous insufficiency.  She has currently been discharged from [facility].  Her husband has been doing dry dressing changes.  She has not tolerated compression of any kind in the past nor lymphedema pumps.  Assessment & Plan: Visit Diagnoses: No diagnosis found.  Plan: Daily Dial soap or Vashe dressing changes with silver alginate or calcium  alginate dressing changes.  Ace for compression elevate lower extremities for swelling.  [facility]st is to resume services.  Follow-Up Instructions: No follow-ups on file.   Ortho Exam  Patient is alert, oriented, no adenopathy, well-dressed, normal affect, normal respiratory effort. On examination bilateral lower extremities there is chronic edema with hemosiderin staining dry peeling skin.  There is maceration with ulcer over the dorsum of the right foot this is 7 cm x 3 and half centimeters with 3 mm of depth there is flat tissue in the wound bed there is no surrounding maceration or warmth.  On examination left lower extremity there are dorsal ulcers times two 3 cm x 2 cm with 2 mm of depth and more proximately 2 cm in diameter there is no surrounding maceration today.  There is no purulence or signs of infection.  Imaging: No results found. No images are attached to the encounter.  Labs: Lab Results  Component Value Date   HGBA1C 6.4 (H) 06/09/2023   HGBA1C 8.1 (H) 10/23/2022   HGBA1C 9.5 (H) 06/11/2022   ESRSEDRATE 110 (H) 05/20/2023   ESRSEDRATE 90 (H) 10/23/2022   ESRSEDRATE 117 (H) 03/06/2021   CRP 4.7 (H) 05/20/2023   CRP  3.8 (H) 10/23/2022   CRP 43.7 (H) 03/06/2021   LABURIC 7.7 (H) 05/20/2023   REPTSTATUS 05/24/2023 FINAL 05/19/2023   GRAMSTAIN  10/24/2022    ABUNDANT SQUAMOUS EPITHELIAL CELLS PRESENT FEW WBC PRESENT, PREDOMINANTLY PMN MODERATE GRAM POSITIVE RODS MODERATE GRAM POSITIVE COCCI FEW GRAM NEGATIVE RODS    CULT  05/19/2023    NO GROWTH 5 DAYS Performed at [facility], [address] [place], [place] [address]    LABORGA PROTEUS MIRABILIS 10/24/2022   LABORGA STAPHYLOCOCCUS AUREUS 10/24/2022   LABORGA PSEUDOMONAS AERUGINOSA 10/24/2022     Lab Results  Component Value Date   ALBUMIN 3.1 (L) 06/09/2023   ALBUMIN 2.7 (L) 05/19/2023   ALBUMIN 2.7 (L) 02/19/2023   PREALBUMIN 12.5 (L) 03/16/2017    Lab Results  Component Value Date   MG 2.1 05/20/2023   MG 2.1 06/27/2022   MG 1.9 06/25/2022   No results found for: "VD25OH"  Lab Results  Component Value Date   PREALBUMIN 12.5 (L) 03/16/2017      Latest Ref Rng & Units 06/10/2023    5:54 AM 06/09/2023    9:07 AM 06/09/2023    8:51 AM  CBC EXTENDED  WBC 4.0 - 10.5 K/uL 5.5   6.8   RBC 3.87 - 5.11 MIL/uL 4.06   4.29   Hemoglobin 12.0 - 15.0 g/dL 11.7  12.9  12.3   HCT 36.0 - 46.0 % 36.8  38.0  39.1   Platelets 150 - 400 K/uL 240   335   NEUT# 1.7 - 7.7 K/uL   4.3   Lymph# 0.7 - 4.0 K/uL   1.8      There is no height or weight on file to calculate BMI.  Orders:  No orders of the defined types were placed in this encounter.  No orders of the defined types were placed in this encounter.    Procedures: No procedures performed  Clinical Data: No additional findings.  ROS:  All other systems negative, except as noted in the HPI. Review of Systems  Objective: Vital Signs: There were no vitals taken for this visit.  Specialty Comments:  No specialty comments available.  PMFS History: Patient Active Problem List   Diagnosis Date Noted   Seizure (HCC) 06/10/2023   Stroke-like symptoms 06/09/2023    Complex partial seizure (HCC) 06/09/2023   Ulcer of extremity due to chronic venous insufficiency (HCC) 02/19/2023   Lymphedema 02/19/2023   SBO (small bowel obstruction) (HCC) 02/18/2023   GAD (generalized anxiety disorder) 02/18/2023   Asthma, chronic 02/18/2023   Diabetic ulcer of left lower leg (HCC) 10/24/2022   Chronic venous hypertension (idiopathic) with ulcer and inflammation of bilateral lower extremity (HCC) 10/24/2022   Non-healing wound of lower extremity 10/23/2022   Bacteria in urine 06/26/2022   Stasis ulcer of lower extremity (HCC) 06/26/2022   Elevated troponin 06/25/2022   Abnormal urinalysis 06/25/2022   History of CVA (cerebrovascular accident) 06/25/2022   Memory loss 06/25/2022   Cryptogenic stroke (HCC) 06/11/2022   Middle cerebral artery embolism, 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   Wound infection 02/05/2021   Venous stasis dermatitis of both lower extremities    Pneumonia due to COVID-19 virus 06/29/2019   Hypophosphatemia 06/24/2019   Obesity 06/23/2019   Insulin  dependent type 2 diabetes mellitus (HCC) 06/22/2019   Anxiety 06/22/2019   Essential hypertension 06/22/2019   Hyperlipidemia 06/22/2019   COVID-19 06/21/2019   Severe protein-calorie malnutrition (HCC) 06/21/2019   Acute respiratory failure with hypoxia (HCC) 06/21/2019   Diabetic foot ulcer associated with type 2 diabetes mellitus (HCC)    Cellulitis 07/05/2016   Depression 05/30/2016   Lower extremity ulceration (HCC) 05/30/2016   Generalized weakness 05/30/2016   Left arm weakness 05/30/2016   Left Lower extremity pain  04/02/2016   Lower extremity pain, inferior, left 04/02/2016   Left leg pain    Personal history of noncompliance with medical treatment, presenting hazards to health 03/31/2016   Jaw pain 03/27/2016   Left leg cellulitis 03/24/2016   Cellulitis of left lower extremity    Primary insomnia    Adjustment disorder    Morbid obesity  (HCC) 01/27/2016   Wheelchair dependent 01/27/2016   Gout 01/27/2016   Arthritis 01/27/2016   Uncontrolled type 2 diabetes mellitus with hypoglycemia, with long-term current use of insulin  (HCC) 01/25/2016   Cellulitis of left leg 01/11/2016   Insulin -requiring or dependent type II diabetes mellitus (HCC) 01/11/2016   Past Medical History:  Diagnosis Date   Anxiety    Arthritis    "back, arms, legs" (03/24/2016)   Asthma    Chronic lower back pain    Colonic polyp    last colonoscopy done in 2009 with normal results per medical record   Depressive disorder    Gastric polyp    Gout    has taken allopurinol   300mg  once daily in past   Headache    History of hiatal hernia    Hypertension    Migraine    "none in awhile; might have a couple/year" (03/24/2016)   Mixed hyperlipidemia    01/2016 Total chol 141, HDL 59, LDL 63, ration 1.1   Osteoarthritis    TIA (transient ischemic attack) 11/2014   Type II diabetes mellitus (HCC)     Family History  Problem Relation Age of Onset   Stroke Father    Stroke Brother    Cancer Other    Gout Mother    Hypertension Mother    Arthritis Mother    Asthma Mother    Stroke Brother     Past Surgical History:  Procedure Laterality Date   ARTERY BIOPSY Right 08/12/2016   Procedure: BIOPSY TEMPORAL ARTERY;  Surgeon: Richrd Char, MD;  Location: [facility] OR;  Service: Vascular;  Laterality: Right;  BIOPSY TEMPORAL ARTERY   BREAST BIOPSY Left ~ 2015   benign   CARPAL TUNNEL RELEASE Bilateral    DILATION AND CURETTAGE OF UTERUS     I & D EXTREMITY Bilateral 10/24/2022   Procedure: DEBRIDEMENT LEFT LEG AND RIGHT ANKLE;  Surgeon: Timothy Ford, MD;  Location: [facility] OR;  Service: Orthopedics;  Laterality: Bilateral;   IR CT HEAD LTD  06/11/2022   IR PERCUTANEOUS ART THROMBECTOMY/INFUSION INTRACRANIAL INC DIAG ANGIO  06/11/2022   IR US  GUIDE VASC ACCESS RIGHT  06/11/2022   KNEE ARTHROSCOPY Right 2003   in [facility]   LAPAROSCOPIC CHOLECYSTECTOMY     LOOP  RECORDER INSERTION N/A 06/26/2022   Procedure: LOOP RECORDER INSERTION;  Surgeon: Lei Pump, MD;  Location: [facility] IN[facility];  Service: Cardiovascular;  Laterality: N/A;   RADIOLOGY WITH ANESTHESIA N/A 06/11/2022   Procedure: IR WITH ANESTHESIA;  Surgeon: Radiologist, Medication, MD;  Location: [facility] OR;  Service: Radiology;  Laterality: N/A;   TUBAL LIGATION     VAGINAL HYSTERECTOMY  1982   Social History   Occupational History   Not on file  Tobacco Use   Smoking status: Never   Smokeless tobacco: Never  Vaping Use   Vaping status: Never Used  Substance and Sexual Activity   Alcohol use: No   Drug use: No   Sexual activity: Never

## 2023-10-20 ENCOUNTER — Telehealth: Payer: Self-pay | Admitting: Family

## 2023-10-20 NOTE — Telephone Encounter (Signed)
 April from [place] called. Would like to  know if there are any new orders for wound care? Her cb# [phone]

## 2023-10-20 NOTE — Telephone Encounter (Signed)
 Called April back, no answer, and VM full. Will try again at later time.

## 2023-10-27 ENCOUNTER — Telehealth: Payer: Self-pay | Admitting: Orthopedic Surgery

## 2023-10-27 NOTE — Telephone Encounter (Signed)
 April from [place] called. Would like to  know if there are any new orders for wound care? Her cb# [phone]  Fax [phone]

## 2023-10-28 NOTE — Telephone Encounter (Signed)
Faxed note to number below

## 2023-10-28 NOTE — Telephone Encounter (Signed)
 April called back and asked for wound care orders to be faxed to her. I have done that. See next TE regarding request.

## 2023-11-04 ENCOUNTER — Encounter: Payer: Self-pay | Admitting: Family

## 2023-11-04 ENCOUNTER — Ambulatory Visit (INDEPENDENT_AMBULATORY_CARE_PROVIDER_SITE_OTHER): Admitting: Family

## 2023-11-04 DIAGNOSIS — L03115 Cellulitis of right lower limb: Secondary | ICD-10-CM

## 2023-11-04 DIAGNOSIS — E08621 Diabetes mellitus due to underlying condition with foot ulcer: Secondary | ICD-10-CM | POA: Diagnosis not present

## 2023-11-04 DIAGNOSIS — L97419 Non-pressure chronic ulcer of right heel and midfoot with unspecified severity: Secondary | ICD-10-CM | POA: Diagnosis not present

## 2023-11-04 DIAGNOSIS — I890 Lymphedema, not elsewhere classified: Secondary | ICD-10-CM | POA: Diagnosis not present

## 2023-11-04 MED ORDER — PENTOXIFYLLINE ER 400 MG PO TBCR: 400.0000 mg | EXTENDED_RELEASE_TABLET | Freq: Three times a day (TID) | ORAL | 3 refills | Status: AC

## 2023-11-04 MED ORDER — CEPHALEXIN 500 MG PO CAPS: 500.0000 mg | ORAL_CAPSULE | Freq: Three times a day (TID) | ORAL | 0 refills | Status: DC

## 2023-11-04 NOTE — Progress Notes (Signed)
 Office Visit Note   Patient: [name]           Date of Birth: [birth year]           MRN: [id number] Visit Date: 11/04/2023              Requested by: Maryellen Snare, NP [address] [place],  [place] [address] PCP: Maryellen Snare, NP  No chief complaint on file.     HPI: The patient is a 73 year old woman who presents in follow-up for ongoing management of her chronic bilateral lower extremity ulcers as well as venous insufficiency and lymphedema.    She has a longstanding history of poorly tolerating compression.  She reports she spent last night up in a chair  She does complain of increased pain to the right lower extremity as well as the ulcer.  Reports has been using alginate dressings  Assessment & Plan: Visit Diagnoses: No diagnosis found.  Plan: Placed on a course of Keflex  concern for cellulitis of the right lower extremity given increased erythema and warmth and pain.  She reports she has upcoming appointment with vascular surgery for evaluation.  Discussed using Trental to improve her microcirculation she would like to try this order called in today.  Follow-Up Instructions: Return in about 15 days (around 11/19/2023).   Ortho Exam  Patient is alert, oriented, no adenopathy, well-dressed, normal affect, normal respiratory effort. On examination bilateral lower extremities she does have chronic edema with darkened discoloration the right is significantly worse than the left today there is erythema and warmth up to the mid shin there is no weeping.  On examination right foot dorsum she has an ulcer which is 7 cm x 1-1/2 cm with 4 mm of depth there is 80% granulation in the wound bed on examination left lower extremity she does also have chronic changes including papillomas and darkened discoloration she has 2 ulcers approximately the ulcers 2-1/2 cm in diameter over the dorsum of the foot more distally the ulcer is 2 x 2 cm this is filled in with thin fibrinous  exudative tissue there is scant surrounding maceration wound is 5 mm in depth  Wounds cleansed with Vashe Vashe to dry dressings applied to all 3 ulcers Ace for compression to bilateral lower extremities  Imaging: No results found. No images are attached to the encounter.  Labs: Lab Results  Component Value Date   HGBA1C 6.4 (H) 06/09/2023   HGBA1C 8.1 (H) 10/23/2022   HGBA1C 9.5 (H) 06/11/2022   ESRSEDRATE 110 (H) 05/20/2023   ESRSEDRATE 90 (H) 10/23/2022   ESRSEDRATE 117 (H) 03/06/2021   CRP 4.7 (H) 05/20/2023   CRP 3.8 (H) 10/23/2022   CRP 43.7 (H) 03/06/2021   LABURIC 7.7 (H) 05/20/2023   REPTSTATUS 05/24/2023 FINAL 05/19/2023   GRAMSTAIN  10/24/2022    ABUNDANT SQUAMOUS EPITHELIAL CELLS PRESENT FEW WBC PRESENT, PREDOMINANTLY PMN MODERATE GRAM POSITIVE RODS MODERATE GRAM POSITIVE COCCI FEW GRAM NEGATIVE RODS    CULT  05/19/2023    NO GROWTH 5 DAYS Performed at [facility], [address], [place], [place] [address]    LABORGA PROTEUS MIRABILIS 10/24/2022   LABORGA STAPHYLOCOCCUS AUREUS 10/24/2022   LABORGA PSEUDOMONAS AERUGINOSA 10/24/2022     Lab Results  Component Value Date   ALBUMIN 3.1 (L) 06/09/2023   ALBUMIN 2.7 (L) 05/19/2023   ALBUMIN 2.7 (L) 02/19/2023   PREALBUMIN 12.5 (L) 03/16/2017    Lab Results  Component Value Date   MG 2.1  05/20/2023   MG 2.1 06/27/2022   MG 1.9 06/25/2022   No results found for: "VD25OH"  Lab Results  Component Value Date   PREALBUMIN 12.5 (L) 03/16/2017      Latest Ref Rng & Units 06/10/2023    5:54 AM 06/09/2023    9:07 AM 06/09/2023    8:51 AM  CBC EXTENDED  WBC 4.0 - 10.5 K/uL 5.5   6.8   RBC 3.87 - 5.11 MIL/uL 4.06   4.29   Hemoglobin 12.0 - 15.0 g/dL 04.5  40.9  81.1   HCT 36.0 - 46.0 % 36.8  38.0  39.1   Platelets 150 - 400 K/uL 240   335   NEUT# 1.7 - 7.7 K/uL   4.3   Lymph# 0.7 - 4.0 K/uL   1.8      There is no height or weight on file to calculate BMI.  Orders:  No orders of the  defined types were placed in this encounter.  No orders of the defined types were placed in this encounter.    Procedures: No procedures performed  Clinical Data: No additional findings.  ROS:  All other systems negative, except as noted in the HPI. Review of Systems  Objective: Vital Signs: There were no vitals taken for this visit.  Specialty Comments:  No specialty comments available.  PMFS History: Patient Active Problem List   Diagnosis Date Noted   Seizure (HCC) 06/10/2023   Stroke-like symptoms 06/09/2023   Complex partial seizure (HCC) 06/09/2023   Ulcer of extremity due to chronic venous insufficiency (HCC) 02/19/2023   Lymphedema 02/19/2023   SBO (small bowel obstruction) (HCC) 02/18/2023   GAD (generalized anxiety disorder) 02/18/2023   Asthma, chronic 02/18/2023   Diabetic ulcer of left lower leg (HCC) 10/24/2022   Chronic venous hypertension (idiopathic) with ulcer and inflammation of bilateral lower extremity (HCC) 10/24/2022   Non-healing wound of lower extremity 10/23/2022   Bacteria in urine 06/26/2022   Stasis ulcer of lower extremity (HCC) 06/26/2022   Elevated troponin 06/25/2022   Abnormal urinalysis 06/25/2022   History of CVA (cerebrovascular accident) 06/25/2022   Memory loss 06/25/2022   Cryptogenic stroke (HCC) 06/11/2022   Middle cerebral artery embolism, 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   Wound infection 02/05/2021   Venous stasis dermatitis of both lower extremities    Pneumonia due to COVID-19 virus 06/29/2019   Hypophosphatemia 06/24/2019   Obesity 06/23/2019   Insulin  dependent type 2 diabetes mellitus (HCC) 06/22/2019   Anxiety 06/22/2019   Essential hypertension 06/22/2019   Hyperlipidemia 06/22/2019   COVID-19 06/21/2019   Severe protein-calorie malnutrition (HCC) 06/21/2019   Acute respiratory failure with hypoxia (HCC) 06/21/2019   Diabetic foot ulcer associated with type 2 diabetes  mellitus (HCC)    Cellulitis 07/05/2016   Depression 05/30/2016   Lower extremity ulceration (HCC) 05/30/2016   Generalized weakness 05/30/2016   Left arm weakness 05/30/2016   Left Lower extremity pain  04/02/2016   Lower extremity pain, inferior, left 04/02/2016   Left leg pain    Personal history of noncompliance with medical treatment, presenting hazards to health 03/31/2016   Jaw pain 03/27/2016   Left leg cellulitis 03/24/2016   Cellulitis of left lower extremity    Primary insomnia    Adjustment disorder    Morbid obesity (HCC) 01/27/2016   Wheelchair dependent 01/27/2016   Gout 01/27/2016   Arthritis 01/27/2016   Uncontrolled type 2 diabetes mellitus with hypoglycemia, with long-term current use  of insulin  (HCC) 01/25/2016   Cellulitis of left leg 01/11/2016   Insulin -requiring or dependent type II diabetes mellitus (HCC) 01/11/2016   Past Medical History:  Diagnosis Date   Anxiety    Arthritis    "back, arms, legs" (03/24/2016)   Asthma    Chronic lower back pain    Colonic polyp    last colonoscopy done in 2009 with normal results per medical record   Depressive disorder    Gastric polyp    Gout    has taken allopurinol  300mg  once daily in past   Headache    History of hiatal hernia    Hypertension    Migraine    "none in awhile; might have a couple/year" (03/24/2016)   Mixed hyperlipidemia    01/2016 Total chol 141, HDL 59, LDL 63, ration 1.1   Osteoarthritis    TIA (transient ischemic attack) 11/2014   Type II diabetes mellitus (HCC)     Family History  Problem Relation Age of Onset   Stroke Father    Stroke Brother    Cancer Other    Gout Mother    Hypertension Mother    Arthritis Mother    Asthma Mother    Stroke Brother     Past Surgical History:  Procedure Laterality Date   ARTERY BIOPSY Right 08/12/2016   Procedure: BIOPSY TEMPORAL ARTERY;  Surgeon: Richrd Char, MD;  Location: [facility];  Service: Vascular;  Laterality: Right;  BIOPSY  TEMPORAL ARTERY   BREAST BIOPSY Left ~ 2015   benign   CARPAL TUNNEL RELEASE Bilateral    DILATION AND CURETTAGE OF UTERUS     I & D EXTREMITY Bilateral 10/24/2022   Procedure: DEBRIDEMENT LEFT LEG AND RIGHT ANKLE;  Surgeon: Timothy Ford, MD;  Location: [facility] OR;  Service: Orthopedics;  Laterality: Bilateral;   IR CT HEAD LTD  06/11/2022   IR PERCUTANEOUS ART THROMBECTOMY/INFUSION INTRACRANIAL INC DIAG ANGIO  06/11/2022   IR US  GUIDE VASC ACCESS RIGHT  06/11/2022   KNEE ARTHROSCOPY Right 2003   in [facility]   LAPAROSCOPIC CHOLECYSTECTOMY     LOOP RECORDER INSERTION N/A 06/26/2022   Procedure: LOOP RECORDER INSERTION;  Surgeon: Lei Pump, MD;  [facility];  Service: Cardiovascular;  Laterality: N/A;   RADIOLOGY WITH ANESTHESIA N/A 06/11/2022   Procedure: IR WITH ANESTHESIA;  Surgeon: Radiologist, Medication, MD;  Location: [facility] OR;  Service: Radiology;  Laterality: N/A;   TUBAL LIGATION     VAGINAL HYSTERECTOMY  1982   Social History   Occupational History   Not on file  Tobacco Use   Smoking status: Never   Smokeless tobacco: Never  Vaping Use   Vaping status: Never Used  Substance and Sexual Activity   Alcohol use: No   Drug use: No   Sexual activity: Never

## 2023-11-18 ENCOUNTER — Encounter: Payer: Self-pay | Admitting: Family

## 2023-11-18 ENCOUNTER — Ambulatory Visit (INDEPENDENT_AMBULATORY_CARE_PROVIDER_SITE_OTHER): Admitting: Family

## 2023-11-18 DIAGNOSIS — I890 Lymphedema, not elsewhere classified: Secondary | ICD-10-CM

## 2023-11-18 DIAGNOSIS — L03115 Cellulitis of right lower limb: Secondary | ICD-10-CM | POA: Diagnosis not present

## 2023-11-18 DIAGNOSIS — I872 Venous insufficiency (chronic) (peripheral): Secondary | ICD-10-CM | POA: Diagnosis not present

## 2023-11-18 NOTE — Progress Notes (Signed)
 Office Visit Note   Patient: [name]           Date of Birth: [birth year]           MRN: [id number] Visit Date: 11/18/2023              Requested by: Maryellen Snare, NP [address] [place],  [place] [address] PCP: Maryellen Snare, NP  Chief Complaint  Patient presents with   Left Leg - Follow-up   Right Leg - Follow-up      HPI: The patient is a 73 year old woman who presents in follow-up for ongoing management of her chronic bilateral lower extremity ulcers as well as venous insufficiency and lymphedema.  Has historically not tolerated compression well.  She is currently completing a course of Keflex  for cellulitis on the right lower extremity  Reports has been using alginate dressings  Assessment & Plan: Visit Diagnoses: No diagnosis found.  Plan: The patient will continue with Vashe wound cleansing daily.  Vashe to dry dressing changes.  Elevate as much as able.   Follow-Up Instructions: No follow-ups on file.   Ortho Exam  Patient is alert, oriented, no adenopathy, well-dressed, normal affect, normal respiratory effort. On examination bilateral lower extremities she does have chronic edema with darkened discoloration.  No erythema or warmth today.  No weeping.  On examination right foot dorsum she has an ulcer which is 7 cm x 3 cm with 4 mm of depth, has recently involved a proximal satellite lesion.  There is 80% granulation in the wound bed on examination left lower extremity she does also have chronic changes including papillomas and darkened discoloration she has 2 ulcers approximately the ulcers 2-1/2 cm in diameter over the dorsum of the foot more distally the ulcer is 2 x 2 cm this is filled in with thin fibrinous exudative tissue there is scant surrounding maceration wound is 5 mm in depth  Wounds cleansed with Vashe, Vashe to dry dressings applied to all 3 ulcers Ace for compression to bilateral lower extremities  Imaging: No results found. No images are  attached to the encounter.  Labs: Lab Results  Component Value Date   HGBA1C 6.4 (H) 06/09/2023   HGBA1C 8.1 (H) 10/23/2022   HGBA1C 9.5 (H) 06/11/2022   ESRSEDRATE 110 (H) 05/20/2023   ESRSEDRATE 90 (H) 10/23/2022   ESRSEDRATE 117 (H) 03/06/2021   CRP 4.7 (H) 05/20/2023   CRP 3.8 (H) 10/23/2022   CRP 43.7 (H) 03/06/2021   LABURIC 7.7 (H) 05/20/2023   REPTSTATUS 05/24/2023 FINAL 05/19/2023   GRAMSTAIN  10/24/2022    ABUNDANT SQUAMOUS EPITHELIAL CELLS PRESENT FEW WBC PRESENT, PREDOMINANTLY PMN MODERATE GRAM POSITIVE RODS MODERATE GRAM POSITIVE COCCI FEW GRAM NEGATIVE RODS    CULT  05/19/2023    NO GROWTH 5 DAYS Performed at [facility], [address], [place], [place] [address]    LABORGA PROTEUS MIRABILIS 10/24/2022   LABORGA STAPHYLOCOCCUS AUREUS 10/24/2022   LABORGA PSEUDOMONAS AERUGINOSA 10/24/2022     Lab Results  Component Value Date   ALBUMIN 3.1 (L) 06/09/2023   ALBUMIN 2.7 (L) 05/19/2023   ALBUMIN 2.7 (L) 02/19/2023   PREALBUMIN 12.5 (L) 03/16/2017    Lab Results  Component Value Date   MG 2.1 05/20/2023   MG 2.1 06/27/2022   MG 1.9 06/25/2022   No results found for: "VD25OH"  Lab Results  Component Value Date   PREALBUMIN 12.5 (L) 03/16/2017      Latest Ref Rng & Units  06/10/2023    5:54 AM 06/09/2023    9:07 AM 06/09/2023    8:51 AM  CBC EXTENDED  WBC 4.0 - 10.5 K/uL 5.5   6.8   RBC 3.87 - 5.11 MIL/uL 4.06   4.29   Hemoglobin 12.0 - 15.0 g/dL 16.1  09.6  04.5   HCT 36.0 - 46.0 % 36.8  38.0  39.1   Platelets 150 - 400 K/uL 240   335   NEUT# 1.7 - 7.7 K/uL   4.3   Lymph# 0.7 - 4.0 K/uL   1.8      There is no height or weight on file to calculate BMI.  Orders:  No orders of the defined types were placed in this encounter.  No orders of the defined types were placed in this encounter.    Procedures: No procedures performed  Clinical Data: No additional findings.  ROS:  All other systems negative, except as noted in the  HPI. Review of Systems  Objective: Vital Signs: There were no vitals taken for this visit.  Specialty Comments:  No specialty comments available.  PMFS History: Patient Active Problem List   Diagnosis Date Noted   Seizure (HCC) 06/10/2023   Stroke-like symptoms 06/09/2023   Complex partial seizure (HCC) 06/09/2023   Ulcer of extremity due to chronic venous insufficiency (HCC) 02/19/2023   Lymphedema 02/19/2023   SBO (small bowel obstruction) (HCC) 02/18/2023   GAD (generalized anxiety disorder) 02/18/2023   Asthma, chronic 02/18/2023   Diabetic ulcer of left lower leg (HCC) 10/24/2022   Chronic venous hypertension (idiopathic) with ulcer and inflammation of bilateral lower extremity (HCC) 10/24/2022   Non-healing wound of lower extremity 10/23/2022   Bacteria in urine 06/26/2022   Stasis ulcer of lower extremity (HCC) 06/26/2022   Elevated troponin 06/25/2022   Abnormal urinalysis 06/25/2022   History of CVA (cerebrovascular accident) 06/25/2022   Memory loss 06/25/2022   Cryptogenic stroke (HCC) 06/11/2022   Middle cerebral artery embolism, 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   Wound infection 02/05/2021   Venous stasis dermatitis of both lower extremities    Pneumonia due to COVID-19 virus 06/29/2019   Hypophosphatemia 06/24/2019   Obesity 06/23/2019   Insulin  dependent type 2 diabetes mellitus (HCC) 06/22/2019   Anxiety 06/22/2019   Essential hypertension 06/22/2019   Hyperlipidemia 06/22/2019   COVID-19 06/21/2019   Severe protein-calorie malnutrition (HCC) 06/21/2019   Acute respiratory failure with hypoxia (HCC) 06/21/2019   Diabetic foot ulcer associated with type 2 diabetes mellitus (HCC)    Cellulitis 07/05/2016   Depression 05/30/2016   Lower extremity ulceration (HCC) 05/30/2016   Generalized weakness 05/30/2016   Left arm weakness 05/30/2016   Left Lower extremity pain  04/02/2016   Lower extremity pain, inferior, left  04/02/2016   Left leg pain    Personal history of noncompliance with medical treatment, presenting hazards to health 03/31/2016   Jaw pain 03/27/2016   Left leg cellulitis 03/24/2016   Cellulitis of left lower extremity    Primary insomnia    Adjustment disorder    Morbid obesity (HCC) 01/27/2016   Wheelchair dependent 01/27/2016   Gout 01/27/2016   Arthritis 01/27/2016   Uncontrolled type 2 diabetes mellitus with hypoglycemia, with long-term current use of insulin  (HCC) 01/25/2016   Cellulitis of left leg 01/11/2016   Insulin -requiring or dependent type II diabetes mellitus (HCC) 01/11/2016   Past Medical History:  Diagnosis Date   Anxiety    Arthritis    "back,  arms, legs" (03/24/2016)   Asthma    Chronic lower back pain    Colonic polyp    last colonoscopy done in 2009 with normal results per medical record   Depressive disorder    Gastric polyp    Gout    has taken allopurinol  300mg  once daily in past   Headache    History of hiatal hernia    Hypertension    Migraine    "none in awhile; might have a couple/year" (03/24/2016)   Mixed hyperlipidemia    01/2016 Total chol 141, HDL 59, LDL 63, ration 1.1   Osteoarthritis    TIA (transient ischemic attack) 11/2014   Type II diabetes mellitus (HCC)     Family History  Problem Relation Age of Onset   Stroke Father    Stroke Brother    Cancer Other    Gout Mother    Hypertension Mother    Arthritis Mother    Asthma Mother    Stroke Brother     Past Surgical History:  Procedure Laterality Date   ARTERY BIOPSY Right 08/12/2016   Procedure: BIOPSY TEMPORAL ARTERY;  Surgeon: Richrd Char, MD;  Location: [facility] OR;  Service: Vascular;  Laterality: Right;  BIOPSY TEMPORAL ARTERY   BREAST BIOPSY Left ~ 2015   benign   CARPAL TUNNEL RELEASE Bilateral    DILATION AND CURETTAGE OF UTERUS     I & D EXTREMITY Bilateral 10/24/2022   Procedure: DEBRIDEMENT LEFT LEG AND RIGHT ANKLE;  Surgeon: Timothy Ford, MD;  Location: [facility] OR;   Service: Orthopedics;  Laterality: Bilateral;   IR CT HEAD LTD  06/11/2022   IR PERCUTANEOUS ART THROMBECTOMY/INFUSION INTRACRANIAL INC DIAG ANGIO  06/11/2022   IR US  GUIDE VASC ACCESS RIGHT  06/11/2022   KNEE ARTHROSCOPY Right 2003   in [facility]   LAPAROSCOPIC CHOLECYSTECTOMY     LOOP RECORDER INSERTION N/A 06/26/2022   Procedure: LOOP RECORDER INSERTION;  Surgeon: Lei Pump, MD;  Location: [facility] INVASIVE CV LAB;  Service: Cardiovascular;  Laterality: N/A;   RADIOLOGY WITH ANESTHESIA N/A 06/11/2022   Procedure: IR WITH ANESTHESIA;  Surgeon: Radiologist, Medication, MD;  Location: [facility] OR;  Service: Radiology;  Laterality: N/A;   TUBAL LIGATION     VAGINAL HYSTERECTOMY  1982   Social History   Occupational History   Not on file  Tobacco Use   Smoking status: Never   Smokeless tobacco: Never  Vaping Use   Vaping status: Never Used  Substance and Sexual Activity   Alcohol use: No   Drug use: No   Sexual activity: Never

## 2023-11-20 ENCOUNTER — Other Ambulatory Visit: Payer: Self-pay | Admitting: *Deleted

## 2023-11-20 DIAGNOSIS — I872 Venous insufficiency (chronic) (peripheral): Secondary | ICD-10-CM

## 2023-12-08 ENCOUNTER — Telehealth: Payer: Self-pay | Admitting: Orthopedic Surgery

## 2023-12-08 NOTE — Telephone Encounter (Signed)
 April (RN) from [facility] called requesting a call back. April states pt bil feet wounds has increased in size. Please call April on secure line at [phone].

## 2023-12-09 ENCOUNTER — Ambulatory Visit

## 2023-12-09 ENCOUNTER — Ambulatory Visit (HOSPITAL_COMMUNITY)

## 2023-12-09 NOTE — Telephone Encounter (Signed)
 LM ON VM TO CALL OFFICE AND SCHEDULE AN APPT for follow up.

## 2023-12-09 NOTE — Telephone Encounter (Signed)
 SW April. She says that wounds are getting larger/worse. Pt cancelled her vein and vascular appt, forgot that she needed this and was confused due to not making the appt herself? (Referred by us ). I let April know that she will need this appt to see if they can do anything on their end to help heal these wounds if circulation issue. I also stated that I would call pt to have her come in to the office soon for another eval in the mean time.

## 2023-12-10 ENCOUNTER — Emergency Department (HOSPITAL_COMMUNITY)

## 2023-12-10 ENCOUNTER — Other Ambulatory Visit: Payer: Self-pay

## 2023-12-10 ENCOUNTER — Encounter (HOSPITAL_COMMUNITY): Payer: Self-pay

## 2023-12-10 ENCOUNTER — Emergency Department (HOSPITAL_COMMUNITY)
Admission: EM | Admit: 2023-12-10 | Discharge: 2023-12-10 | Disposition: A | Discharge status: Home / Self Care | Attending: Emergency Medicine | Admitting: Emergency Medicine

## 2023-12-10 DIAGNOSIS — E119 Type 2 diabetes mellitus without complications: Secondary | ICD-10-CM | POA: Diagnosis not present

## 2023-12-10 DIAGNOSIS — R600 Localized edema: Secondary | ICD-10-CM | POA: Diagnosis not present

## 2023-12-10 DIAGNOSIS — Z91040 Latex allergy status: Secondary | ICD-10-CM | POA: Diagnosis not present

## 2023-12-10 DIAGNOSIS — Z7982 Long term (current) use of aspirin: Secondary | ICD-10-CM | POA: Diagnosis not present

## 2023-12-10 DIAGNOSIS — R569 Unspecified convulsions: Secondary | ICD-10-CM | POA: Insufficient documentation

## 2023-12-10 DIAGNOSIS — I10 Essential (primary) hypertension: Secondary | ICD-10-CM | POA: Diagnosis not present

## 2023-12-10 DIAGNOSIS — Z79899 Other long term (current) drug therapy: Secondary | ICD-10-CM | POA: Diagnosis not present

## 2023-12-10 LAB — URINALYSIS, W/ REFLEX TO CULTURE (INFECTION SUSPECTED)
Bilirubin Urine: NEGATIVE
Glucose, UA: NEGATIVE mg/dL
Hgb urine dipstick: NEGATIVE
Ketones, ur: NEGATIVE mg/dL
Leukocytes,Ua: NEGATIVE
Nitrite: NEGATIVE
Protein, ur: NEGATIVE mg/dL
Specific Gravity, Urine: 1.005 (ref 1.005–1.030)
pH: 7 (ref 5.0–8.0)

## 2023-12-10 LAB — COMPREHENSIVE METABOLIC PANEL WITH GFR
ALT: 9 U/L (ref 0–44)
AST: 13 U/L — ABNORMAL LOW (ref 15–41)
Albumin: 2.9 g/dL — ABNORMAL LOW (ref 3.5–5.0)
Alkaline Phosphatase: 97 U/L (ref 38–126)
Anion gap: 12 (ref 5–15)
BUN: 12 mg/dL (ref 8–23)
CO2: 20 mmol/L — ABNORMAL LOW (ref 22–32)
Calcium: 9.3 mg/dL (ref 8.9–10.3)
Chloride: 107 mmol/L (ref 98–111)
Creatinine, Ser: 1.04 mg/dL — ABNORMAL HIGH (ref 0.44–1.00)
GFR, Estimated: 56 mL/min — ABNORMAL LOW (ref >60)
Glucose, Bld: 96 mg/dL (ref 70–99)
Potassium: 3.7 mmol/L (ref 3.5–5.1)
Sodium: 139 mmol/L (ref 135–145)
Total Bilirubin: 0.3 mg/dL (ref 0.0–1.2)
Total Protein: 8.5 g/dL — ABNORMAL HIGH (ref 6.5–8.1)

## 2023-12-10 LAB — CBC WITH DIFFERENTIAL/PLATELET
Abs Immature Granulocytes: 0.02 10*3/uL (ref 0.00–0.07)
Basophils Absolute: 0.1 10*3/uL (ref 0.0–0.1)
Basophils Relative: 1 %
Eosinophils Absolute: 0.3 10*3/uL (ref 0.0–0.5)
Eosinophils Relative: 4 %
HCT: 35.7 % — ABNORMAL LOW (ref 36.0–46.0)
Hemoglobin: 11.2 g/dL — ABNORMAL LOW (ref 12.0–15.0)
Immature Granulocytes: 0 %
Lymphocytes Relative: 22 %
Lymphs Abs: 1.5 10*3/uL (ref 0.7–4.0)
MCH: 27.3 pg (ref 26.0–34.0)
MCHC: 31.4 g/dL (ref 30.0–36.0)
MCV: 86.9 fL (ref 80.0–100.0)
Monocytes Absolute: 0.4 10*3/uL (ref 0.1–1.0)
Monocytes Relative: 5 %
Neutro Abs: 4.7 10*3/uL (ref 1.7–7.7)
Neutrophils Relative %: 68 %
Platelets: 373 10*3/uL (ref 150–400)
RBC: 4.11 MIL/uL (ref 3.87–5.11)
RDW: 15.9 % — ABNORMAL HIGH (ref 11.5–15.5)
WBC: 7 10*3/uL (ref 4.0–10.5)
nRBC: 0 % (ref 0.0–0.2)

## 2023-12-10 MED ORDER — LEVETIRACETAM (KEPPRA) 500 MG/5 ML ADULT IV PUSH
1500.0000 mg | Freq: Once | INTRAVENOUS | Status: AC
  Administered 2023-12-10: 1500 mg via INTRAVENOUS
  Filled 2023-12-10: qty 15

## 2023-12-10 MED ORDER — LEVETIRACETAM 750 MG PO TABS: 750.0000 mg | ORAL_TABLET | Freq: Two times a day (BID) | ORAL | 1 refills | Status: DC

## 2023-12-10 NOTE — ED Provider Notes (Signed)
 [place] EMERGENCY DEPARTMENT AT [facility] Provider Note   CSN: [id number] Arrival date & time: 12/10/23  1004     Patient presents with: Seizures   [name] is a 73 y.o. female.    Pt is a 72y/o female with hx of type 2 diabetes, TIA (2016), chronic lower back pain, depression, anxiety, HTN, HLD, OA, morbid obesity with chronic venous stasis ulcers on the lower legs bilaterally and in December of this past year concern for seizures and had been started on Keppra  however patient does not believe she is taking this now who is presenting today due to the sudden onset of jerking of her face and feeling like her tongue is swollen.  When EMS arrived they reported she had rhythmic twitching of the left side of her face and shoulders.  No movement of the upper or lower extremities.  Patient had continual drooling and she reports that she feels like she cannot swallow well.  She denies any pain or any recent falls or head injury.  She denies any shortness of breath.  She reports this episode is very similar to what she experienced in December.  EMS gave 5 mg of IM midazolam  with improvement of the twitching sensations but patient reports her tongue still feels weird.  The history is provided by the patient, the EMS personnel and medical records.  Seizures      Prior to Admission medications   Medication Sig Start Date End Date Taking? Authorizing Provider  levETIRAcetam  (KEPPRA ) 750 MG tablet Take 1 tablet (750 mg total) by mouth 2 (two) times daily. 12/10/23  Yes Doretha Folks, MD  acetaminophen  (TYLENOL ) 500 MG tablet Take 1,000 mg by mouth every 6 (six) hours as needed for moderate pain (pain score 4-6) or mild pain (pain score 1-3).    [provider]  albuterol  (VENTOLIN  HFA) 108 (90 Base) MCG/ACT inhaler Inhale 2 puffs into the lungs every 6 (six) hours as needed for wheezing or shortness of breath.    [provider]  allopurinol  (ZYLOPRIM ) 100 MG  tablet Take 1 tablet (100 mg total) by mouth daily. 06/11/23   Cindy Garnette POUR, MD  aspirin  EC 81 MG tablet Take 81 mg by mouth daily. Swallow whole.    [provider]  atenolol  (TENORMIN ) 25 MG tablet Take 25-50 mg by mouth See admin instructions. Take 50 mg (2 tablets) every morning and 25 mg (1 tablet) every evening.    [provider]  atorvastatin  (LIPITOR) 20 MG tablet Take 1 tablet (20 mg total) by mouth at bedtime. 06/11/23 07/11/23  Cindy Garnette POUR, MD  budesonide-formoterol  [facility]) 160-4.5 MCG/ACT inhaler Inhale 2 puffs into the lungs daily.    [provider]  cephALEXin  (KEFLEX ) 500 MG capsule Take 1 capsule (500 mg total) by mouth 3 (three) times daily. 11/04/23   Zamora, Erin R, NP  cyclobenzaprine  (FLEXERIL ) 5 MG tablet Take 5 mg by mouth 3 (three) times daily as needed for muscle spasms. 06/03/22   [provider]  donepezil  (ARICEPT ) 10 MG tablet Take 10 mg by mouth at bedtime.    [provider]  doxycycline  (VIBRA -TABS) 100 MG tablet Take 1 tablet (100 mg total) by mouth 2 (two) times daily. 09/02/23   Valdemar Rocky SAUNDERS, NP  gabapentin  (NEURONTIN ) 300 MG capsule Take 300 mg by mouth daily.    [provider]  mirtazapine  (REMERON ) 7.5 MG tablet Take 7.5 mg by mouth at bedtime.    [provider]  Multiple Vitamins-Minerals (MULTIVITAMIN WOMEN 50+) TABS Take 1 tablet by mouth at bedtime.    [provider]  NIFEdipine  (PROCARDIA  XL/NIFEDICAL-XL) 90 MG 24 hr tablet Take 90 mg by mouth daily.    [provider]  pentoxifylline  (TRENTAL ) 400 MG CR tablet Take 1 tablet (400 mg total) by mouth 3 (three) times daily with meals. 11/04/23   Valdemar Rocky SAUNDERS, NP  TRESIBA  FLEXTOUCH 200 UNIT/ML FlexTouch Pen Inject 30 Units into the skin in the morning and at bedtime. 02/26/23   [provider]    Allergies: Ace inhibitors, Latex, Ultram [tramadol], Zanaflex [tizanidine], Diprivan  [propofol ], and Metformin  and related    Review of Systems  Neurological:  Positive for seizures.    Updated Vital Signs BP 130/71 (BP Location: Right Arm)   Pulse 88   Temp 98.1 F (36.7 C) (Oral)   Resp (!) 21   Ht 5' 4 (1.626 m)   Wt 97.1 kg   SpO2 98%   BMI 36.73 kg/m   Physical Exam Vitals and nursing note reviewed.  Constitutional:      General: She is not in acute distress.    Appearance: She is well-developed.  HENT:     Head: Normocephalic and atraumatic.     Mouth/Throat:     Mouth: Mucous membranes are moist.     Comments: No tongue swelling or posterior pharyngeal swelling  Eyes:     Pupils: Pupils are equal, round, and reactive to light.   Neck:     Comments: No stridor Cardiovascular:     Rate and Rhythm: Normal rate and regular rhythm.     Heart sounds: Normal heart sounds. No murmur heard.    No friction rub.  Pulmonary:     Effort: Pulmonary effort is normal.     Breath sounds: Normal breath sounds. No wheezing or rales.  Abdominal:     General: Bowel sounds are normal. There is no distension.     Palpations: Abdomen is soft.     Tenderness: There is no abdominal tenderness. There is no guarding or rebound.   Musculoskeletal:        General: No tenderness. Normal range of motion.     Right lower leg: Edema present.     Left lower leg: Edema present.     Comments: Edema present in bilateral lower extremities with chronic venous stasis and wounds present on bilateral lower legs and upper feet with minimal weeping   Skin:    General: Skin is warm and dry.     Findings: No rash.   Neurological:     Mental Status: She is alert and oriented to person, place, and time.     Cranial Nerves: No cranial nerve deficit.     Comments: Continually spitting out her saliva and moving her tongue around in her mouth but no tongue fasciculations.  No rhythmic jerking of the face or arms but does lean slightly to the left.  Patient is able to move upper and lower extremities normally  without pronator drift or weakness.  She is able to reposition herself and sit up in bed independently.  Can answer all questions but speech is slightly slurred  Psychiatric:        Behavior: Behavior normal.     (all labs ordered are listed, but only abnormal results are displayed) Labs Reviewed  CBC WITH DIFFERENTIAL/PLATELET - Abnormal; Notable for the following components:      Result Value   Hemoglobin 11.2 (*)  HCT 35.7 (*)    RDW 15.9 (*)    All other components within normal limits  COMPREHENSIVE METABOLIC PANEL WITH GFR - Abnormal; Notable for the following components:   CO2 20 (*)    Creatinine, Ser 1.04 (*)    Total Protein 8.5 (*)    Albumin 2.9 (*)    AST 13 (*)    GFR, Estimated 56 (*)    All other components within normal limits  URINALYSIS, W/ REFLEX TO CULTURE (INFECTION SUSPECTED) - Abnormal; Notable for the following components:   Color, Urine STRAW (*)    Bacteria, UA RARE (*)    All other components within normal limits    EKG: EKG Interpretation Date/Time:  Thursday December 10 2023 10:18:50 EDT Ventricular Rate:  86 PR Interval:  156 QRS Duration:  87 QT Interval:  393 QTC Calculation: 471 R Axis:   -2  Text Interpretation: Sinus rhythm Low voltage, precordial leads LVH by voltage No significant change since last tracing Confirmed by Doretha Folks ([id number]) on 12/10/2023 11:44:44 AM  Radiology: CT Head Wo Contrast Result Date: 12/10/2023 CLINICAL DATA:  Neuro deficit. EXAM: CT HEAD WITHOUT CONTRAST TECHNIQUE: Contiguous axial images were obtained from the base of the skull through the vertex without intravenous contrast. RADIATION DOSE REDUCTION: This exam was performed according to the departmental dose-optimization program which includes automated exposure control, adjustment of the mA and/or kV according to patient size and/or use of iterative reconstruction technique. COMPARISON:  June 09, 2023 FINDINGS: Brain: There is generalized cerebral  atrophy with widening of the extra-axial spaces and ventricular dilatation. There are areas of decreased attenuation within the white matter tracts of the supratentorial brain, consistent with microvascular disease changes. A chronic right parietal lobe infarct is seen. Vascular: No hyperdense vessel or unexpected calcification. Skull: Normal. Negative for fracture or focal lesion. Sinuses/Orbits: No acute finding. Other: None. IMPRESSION: 1. Generalized cerebral atrophy and microvascular disease changes of the supratentorial brain. 2. Chronic right parietal lobe infarct. 3. No acute intracranial abnormality. Electronically Signed   By: Suzen Dials M.D.   On: 12/10/2023 11:06   DG Chest Port 1 View Result Date: 12/10/2023 CLINICAL DATA:  jerking movements EXAM: PORTABLE CHEST - 1 VIEW COMPARISON:  February 17, 2023 FINDINGS: Low lung volumes with bronchovascular crowding and streaky bibasilar atelectasis. No focal airspace consolidation, pleural effusion, or pneumothorax. Mild cardiomegaly. Cardiac loop recorder device again noted. Tortuous aorta with aortic atherosclerosis. No acute fracture or destructive lesions. Multilevel thoracic osteophytosis. IMPRESSION: Low lung volumes with streaky bibasilar atelectasis. No pneumonia or pulmonary edema. Electronically Signed   By: Rogelia Myers M.D.   On: 12/10/2023 10:58     Procedures   Medications Ordered in the ED  levETIRAcetam  (KEPPRA ) undiluted injection 1,500 mg (has no administration in time range)  levETIRAcetam  (KEPPRA ) undiluted injection 1,500 mg (1,500 mg Intravenous Given 12/10/23 1106)                                    Medical Decision Making Amount and/or Complexity of Data Reviewed Independent Historian: EMS External Data Reviewed: notes. Labs: ordered. Decision-making details documented in ED Course. Radiology: ordered and independent interpretation performed. Decision-making details documented in ED Course. ECG/medicine  tests: ordered and independent interpretation performed. Decision-making details documented in ED Course.   Pt with multiple medical problems and comorbidities and presenting today with a complaint that caries a high risk for morbidity and  mortality.  Here today with above symptoms.  Concern for recurrent seizure based on history from last hospitalization.  EEGs were not conclusive but patient symptoms sound like seizures.  She denies taking any antiepileptic's at this time.  However she was discharged on Keppra .  No neurology notes are present in the system unclear if she followed up.  There is no evidence to suggest an allergic reaction at this time.  Will rule out any signs of infection or exacerbating symptoms.  Will also do CT of the brain.  Patient is awake and alert at this time.  She has already received Versed  and will give IV Keppra .  2:39 PM On reevaluation patient's symptoms are starting to improve.  She is now able to speak a little better but is still using suction as she is still having some difficulty swallowing.  I independently interpreted patient's labs and EKG.  CBC without acute findings, CMP with mild bump of creatinine to 1 from her baseline of 0.8.  EKG without acute findings today.  I have independently visualized and interpreted pt's images today.  Chest x-ray without acute findings today and head CT without evidence of acute bleed or mass.  Radiology reports low lung volumes with streaky atelectasis but no pneumonia or pulmonary edema, head CT with generalized cerebellar atrophy and microvascular disease of the supratentorial brain with a chronic right parietal lobe infarct but no acute findings.  Patient was able to get out of bed and walk to a chair to go to the bathroom.  2:39 PM Patient is now back to baseline and would like to go home.  She was given an additional dose of Keppra  total of 3000 mg.  Spoke with Dr. Deedra who recommended starting 750 mg Keppra  twice daily and a  ambulatory referral for neurology was sent. Patient and her husband are comfortable with this plan.  She was discharged home in good condition.     Final diagnoses:  Seizure [facility])    ED Discharge Orders          Ordered    Ambulatory referral to Neurology       Comments: An appointment is requested in approximately: 2 weeks   12/10/23 1438    levETIRAcetam  (KEPPRA ) 750 MG tablet  2 times daily        12/10/23 1438               Doretha Folks, MD 12/10/23 1439

## 2023-12-10 NOTE — ED Triage Notes (Signed)
 EMS arrived patient has full facial distortion.  ONly 1 previous seizure and no seizure meds or anti psychotic.  Difficulty swallowing.  Got 5mg  versed  IM by ems.  Started around 0815.  ABle to stand and pivot to stretcher.  Leg swelling.  160/84 CBG 101 HR70

## 2023-12-10 NOTE — Discharge Instructions (Signed)
 All the blood work looks good today and the head scan.  You had another seizure which caused your issues today.  You will start the seizure pills tomorrow you will take your first dose in the morning.  You already had the medicine today.  If you have any further seizures return to the emergency room.

## 2023-12-16 ENCOUNTER — Encounter: Payer: Self-pay | Admitting: Family

## 2023-12-16 ENCOUNTER — Ambulatory Visit: Admitting: Family

## 2023-12-16 DIAGNOSIS — I890 Lymphedema, not elsewhere classified: Secondary | ICD-10-CM

## 2023-12-16 NOTE — Addendum Note (Signed)
 Addended by: Sincere Berlanga R on: 12/16/2023 03:49 PM   Modules accepted: Level of Service

## 2023-12-16 NOTE — Progress Notes (Addendum)
 Office Visit Note   Patient: [name]           Date of Birth: 07-Feb-1951           MRN: [id number] Visit Date: 12/16/2023              Requested by: Campbell Reynolds, NP [address] [place],  [place] [address] PCP: Campbell Reynolds, NP  Chief Complaint  Patient presents with   Right Leg - Wound Check   Left Leg - Wound Check      HPI: The patient is a 73 year old woman with a history of type 2 diabetes, TIA in 2016, chronic low back pain, depression, anxiety, hypertension, hyperlipidemia, morbid obesity, history of seizures including emergency department visit on June 26 for seizures who presents today for evaluation of her bilateral lower extremity wounds and edema.  History of lymphedema and chronic ulcers.  While being transported to the exam room she had episode of loss of consciousness.  She was arousable however she is not oriented to place or time.  She goes on to report she has had 3 days of decreased p.o. intake.  Abdominal pain.  Known incontinence of urine.  Smells of urine today.  Her husband who accompanies reports this is not her baseline  Her blood sugar today was 104.  Blood pressure 153/80 heart rate 113 oxygen saturation 98% temperature 97.9 F  EMS called EKG was performed.  EMS evaluated.  Patient ultimately declined transportation.  They would like to drive by private vehicle to her primary care office.  Myself and EMS discussed risks offered transportation to the emergency department for evaluation  Assessment & Plan: Visit Diagnoses:  1. Lymphedema     Plan: Again reinforced the importance of follow-up, encouraged them to transport or call directly to her primary care or the emergency department.  Her legs and feet were evaluated cleansed and redressed  Follow-Up Instructions: No follow-ups on file.   Ortho Exam  Patient is alert, oriented, no adenopathy, ill-dressed, dressings and shoes soaked in urine normal affect, normal respiratory  effort. On examination of bilateral lower extremity she does have worsening of her chronic edema with hyperpigmentation there is no erythema warmth or weeping.  The right foot dorsal ulceration is stable however today does have increased fibrinous exudative tissue the left foot ulcer with again increased fibrinous tissue and scant serous drainage there is no sign of infection    Imaging: No results found. No images are attached to the encounter.  Labs: Lab Results  Component Value Date   HGBA1C 6.4 (H) 06/09/2023   HGBA1C 8.1 (H) 10/23/2022   HGBA1C 9.5 (H) 06/11/2022   ESRSEDRATE 110 (H) 05/20/2023   ESRSEDRATE 90 (H) 10/23/2022   ESRSEDRATE 117 (H) 03/06/2021   CRP 4.7 (H) 05/20/2023   CRP 3.8 (H) 10/23/2022   CRP 43.7 (H) 03/06/2021   LABURIC 7.7 (H) 05/20/2023   REPTSTATUS 05/24/2023 FINAL 05/19/2023   GRAMSTAIN  10/24/2022    ABUNDANT SQUAMOUS EPITHELIAL CELLS PRESENT FEW WBC PRESENT, PREDOMINANTLY PMN MODERATE GRAM POSITIVE RODS MODERATE GRAM POSITIVE COCCI FEW GRAM NEGATIVE RODS    CULT  05/19/2023    NO GROWTH 5 DAYS Performed at [facility], [address] [place], [place] [address]    LABORGA PROTEUS MIRABILIS 10/24/2022   LABORGA STAPHYLOCOCCUS AUREUS 10/24/2022   LABORGA PSEUDOMONAS AERUGINOSA 10/24/2022     Lab Results  Component Value Date   ALBUMIN 2.9 (L) 12/10/2023   ALBUMIN 3.1 (L)  06/09/2023   ALBUMIN 2.7 (L) 05/19/2023   PREALBUMIN 12.5 (L) 03/16/2017    Lab Results  Component Value Date   MG 2.1 05/20/2023   MG 2.1 06/27/2022   MG 1.9 06/25/2022   No results found for: [facility]  Lab Results  Component Value Date   PREALBUMIN 12.5 (L) 03/16/2017      Latest Ref Rng & Units 12/10/2023   10:18 AM 06/10/2023    5:54 AM 06/09/2023    9:07 AM  CBC EXTENDED  WBC 4.0 - 10.5 K/uL 7.0  5.5    RBC 3.87 - 5.11 MIL/uL 4.11  4.06    Hemoglobin 12.0 - 15.0 g/dL 88.7  88.2  87.0   HCT 36.0 - 46.0 % 35.7  36.8  38.0   Platelets 150 -  400 K/uL 373  240    NEUT# 1.7 - 7.7 K/uL 4.7     Lymph# 0.7 - 4.0 K/uL 1.5        There is no height or weight on file to calculate BMI.  Orders:  No orders of the defined types were placed in this encounter.  No orders of the defined types were placed in this encounter.    Procedures: No procedures performed  Clinical Data: No additional findings.  ROS:  All other systems negative, except as noted in the HPI. Review of Systems  Objective: Vital Signs: There were no vitals taken for this visit.  Specialty Comments:  No specialty comments available.  PMFS History: Patient Active Problem List   Diagnosis Date Noted   Seizure (HCC) 06/10/2023   Stroke-like symptoms 06/09/2023   Complex partial seizure (HCC) 06/09/2023   Ulcer of extremity due to chronic venous insufficiency (HCC) 02/19/2023   Lymphedema 02/19/2023   SBO (small bowel obstruction) (HCC) 02/18/2023   GAD (generalized anxiety disorder) 02/18/2023   Asthma, chronic 02/18/2023   Diabetic ulcer of left lower leg (HCC) 10/24/2022   Chronic venous hypertension (idiopathic) with ulcer and inflammation of bilateral lower extremity (HCC) 10/24/2022   Non-healing wound of lower extremity 10/23/2022   Bacteria in urine 06/26/2022   Stasis ulcer of lower extremity (HCC) 06/26/2022   Elevated troponin 06/25/2022   Abnormal urinalysis 06/25/2022   History of CVA (cerebrovascular accident) 06/25/2022   Memory loss 06/25/2022   Cryptogenic stroke (HCC) 06/11/2022   Middle cerebral artery embolism, 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   Wound infection 02/05/2021   Venous stasis dermatitis of both lower extremities    Pneumonia due to COVID-19 virus 06/29/2019   Hypophosphatemia 06/24/2019   Obesity 06/23/2019   Insulin  dependent type 2 diabetes mellitus (HCC) 06/22/2019   Anxiety 06/22/2019   Essential hypertension 06/22/2019   Hyperlipidemia 06/22/2019   COVID-19 06/21/2019    Severe protein-calorie malnutrition (HCC) 06/21/2019   Acute respiratory failure with hypoxia (HCC) 06/21/2019   Diabetic foot ulcer associated with type 2 diabetes mellitus (HCC)    Cellulitis 07/05/2016   Depression 05/30/2016   Lower extremity ulceration (HCC) 05/30/2016   Generalized weakness 05/30/2016   Left arm weakness 05/30/2016   Left Lower extremity pain  04/02/2016   Lower extremity pain, inferior, left 04/02/2016   Left leg pain    Personal history of noncompliance with medical treatment, presenting hazards to health 03/31/2016   Jaw pain 03/27/2016   Left leg cellulitis 03/24/2016   Cellulitis of left lower extremity    Primary insomnia    Adjustment disorder    Morbid obesity (HCC) 01/27/2016  Wheelchair dependent 01/27/2016   Gout 01/27/2016   Arthritis 01/27/2016   Uncontrolled type 2 diabetes mellitus with hypoglycemia, with long-term current use of insulin  (HCC) 01/25/2016   Cellulitis of left leg 01/11/2016   Insulin -requiring or dependent type II diabetes mellitus (HCC) 01/11/2016   Past Medical History:  Diagnosis Date   Anxiety    Arthritis    back, arms, legs (03/24/2016)   Asthma    Chronic lower back pain    Colonic polyp    last colonoscopy done in 2009 with normal results per medical record   Depressive disorder    Gastric polyp    Gout    has taken allopurinol  300mg  once daily in past   Headache    History of hiatal hernia    Hypertension    Migraine    none in awhile; might have a couple/year (03/24/2016)   Mixed hyperlipidemia    01/2016 Total chol 141, HDL 59, LDL 63, ration 1.1   Osteoarthritis    TIA (transient ischemic attack) 11/2014   Type II diabetes mellitus (HCC)     Family History  Problem Relation Age of Onset   Stroke Father    Stroke Brother    Cancer Other    Gout Mother    Hypertension Mother    Arthritis Mother    Asthma Mother    Stroke Brother     Past Surgical History:  Procedure Laterality Date    ARTERY BIOPSY Right 08/12/2016   Procedure: BIOPSY TEMPORAL ARTERY;  Surgeon: Carlin FORBES Haddock, MD;  Location: [facility] OR;  Service: Vascular;  Laterality: Right;  BIOPSY TEMPORAL ARTERY   BREAST BIOPSY Left ~ 2015   benign   CARPAL TUNNEL RELEASE Bilateral    DILATION AND CURETTAGE OF UTERUS     I & D EXTREMITY Bilateral 10/24/2022   Procedure: DEBRIDEMENT LEFT LEG AND RIGHT ANKLE;  Surgeon: Harden Jerona GAILS, MD;  Location: [facility] OR;  Service: Orthopedics;  Laterality: Bilateral;   IR CT HEAD LTD  06/11/2022   IR PERCUTANEOUS ART THROMBECTOMY/INFUSION INTRACRANIAL INC DIAG ANGIO  06/11/2022   IR US  GUIDE VASC ACCESS RIGHT  06/11/2022   KN[facility]ROSCOPY Right 2003   [facility]   LAPAROSCOPIC CHOLECYSTECTOMY     LOOP RECORDER INSERTION N/A 06/26/2022   Procedure: LOOP RECORDER INSERTION;  Surgeon: Inocencio Soyla Lunger, MD;  Location: [facility] INVASIVE [facility] LAB;  Service: Cardiovascular;  Laterality: N/A;   RADIOLOGY WITH ANESTHESIA N/A 06/11/2022   Procedure: IR WITH ANESTHESIA;  Surgeon: Radiologist, Medication, MD;  Location: [facility] OR;  Service: Radiology;  Laterality: N/A;   TUBAL LIGATION     VAGINAL HYSTERECTOMY  1982   Social History   Occupational History   Not on file  Tobacco Use   Smoking status: Never   Smokeless tobacco: Never  Vaping Use   Vaping status: Never Used  Substance and Sexual Activity   Alcohol use: No   Drug use: No   Sexual activity: Never

## 2023-12-17 ENCOUNTER — Ambulatory Visit: Admitting: Neurology

## 2023-12-17 ENCOUNTER — Encounter: Payer: Self-pay | Admitting: Neurology

## 2023-12-17 DIAGNOSIS — I69354 Hemiplegia and hemiparesis following cerebral infarction affecting left non-dominant side: Secondary | ICD-10-CM | POA: Diagnosis not present

## 2023-12-17 DIAGNOSIS — Z993 Dependence on wheelchair: Secondary | ICD-10-CM

## 2023-12-17 DIAGNOSIS — R5383 Other fatigue: Secondary | ICD-10-CM

## 2023-12-17 DIAGNOSIS — R569 Unspecified convulsions: Secondary | ICD-10-CM

## 2023-12-17 DIAGNOSIS — R6889 Other general symptoms and signs: Secondary | ICD-10-CM

## 2023-12-17 DIAGNOSIS — I6601 Occlusion and stenosis of right middle cerebral artery: Secondary | ICD-10-CM | POA: Diagnosis not present

## 2023-12-17 DIAGNOSIS — R413 Other amnesia: Secondary | ICD-10-CM

## 2023-12-17 MED ORDER — LEVETIRACETAM 750 MG PO TABS
750.0000 mg | ORAL_TABLET | Freq: Two times a day (BID) | ORAL | 4 refills | Status: AC
Start: 2023-12-17 — End: ?

## 2023-12-17 NOTE — Progress Notes (Signed)
 [facility]  PATIENT: [name] DOB: [birth year]  REFERRING DOCTOR OR PCP: Benton Shone, MD SOURCE: Patient, notes from recent hospitalization, imaging and lab reports, imaging studies personally reviewed.  _________________________________   HISTORICAL  CHIEF COMPLAINT:  Chief Complaint  Patient presents with   RM10/SEIZURE    Pt is here with her Husband. Pt had her First Seizure 12/10/2023. Pt states she has been stressed. Pt states that she has a family history of seizures. Pt is on Keppra  at the moment. Pt states that she would like to discuss wether or not she will be on Keppra  for the rest of her life or just for a short period of time.     HISTORY OF PRESENT ILLNESS:  I had the pleasure of seeing your patient, [name], at [facility] for neurologic consultation regarding her seizures.  She is a 73 year old woman who had a recent seizure.  She was talking to her husband when she had the onset of slurred speech.   He did not note any facial droop or limb weakness.    She has memory of the episode and knew her speech was off.  She was mildly confused.  EMT was called and speech was still slurred but better.    EMS reportedly saw left face and arm twitching and felt she was having a seizure.   They gave 5 mg midazolam  IM.   Twitching completely resolved by the time she got to the ED.  She was then given Keppra  while in the hospital..     She also had some spells December 2024 that were felt to be seizure - one witnessed by CT technician with rhythmic contractions of the left face and garbled speech.     She has a history of a TIA in the past (2016)  I have seen her in 2021 for memory loss.  She and her husband both feel that her memory is doing about the same now as it was in 2021.  At the time, she had a Montreal cognitive assessment score of 20/30 (mild cognitive impairment).  She was also depressed which could have played some  role in her memory issues  Imaging personally reviewed CT 12/10/2023 shows a chronic right posterior frontal CVA  CTA 10/25/2022 showed  Atheromatous calcification of the carotid siphons. Right MCA branch irregularity correlating with a chronic infarct. No emergent large vessel occlusion, proximal flow limiting stenosis, or aneurysm.   Vertebrobasilar tortuosity without stenosis, branch occlusion, beading, or aneurysm.  MRI of the brain 06/09/2023 shows the remote left posterior frontal stroke and scattered T2/FLAIR hyperintense foci consistent with chronic microvascular ischemic changes in the pons and elsewhere in the hemispheres.  No acute findings.  REVIEW OF SYSTEMS: Constitutional: No fevers, chills, sweats, or change in appetite.  She has insomnia. Eyes: No visual changes, double vision, eye pain Ear, nose and throat: No hearing loss, ear pain, nasal congestion, sore throat Cardiovascular: No chest pain, palpitations Respiratory:  No shortness of breath at rest or with exertion.   No wheezes GastrointestinaI: No nausea, vomiting, diarrhea, abdominal pain, fecal incontinence Genitourinary:  No dysuria, urinary retention or frequency.  No nocturia. Musculoskeletal: No some back pain. Integumentary: No rash, pruritus, skin lesions Neurological: as above Psychiatric: No depression at this time.  No anxiety Endocrine: No palpitations, diaphoresis, change in appetite, change in weigh or increased thirst Hematologic/Lymphatic:  No anemia, purpura, petechiae. Allergic/Immunologic: No itchy/runny eyes, nasal congestion, recent allergic reactions, rashes  ALLERGIES: Allergies  Allergen Reactions   Ace Inhibitors Swelling   Latex Itching and Swelling   Ultram [Tramadol] Nausea And Vomiting   Zanaflex [Tizanidine] Other (See Comments)    Tremors    Diprivan  [Propofol ] Itching   Metformin And Related Other (See Comments)    Tremors  Chills    HOME MEDICATIONS:  Current Outpatient  Medications:    acetaminophen  (TYLENOL ) 500 MG tablet, Take 1,000 mg by mouth every 6 (six) hours as needed for moderate pain (pain score 4-6) or mild pain (pain score 1-3)., Disp: , Rfl:    albuterol  (VENTOLIN  HFA) 108 (90 Base) MCG/ACT inhaler, Inhale 2 puffs into the lungs every 6 (six) hours as needed for wheezing or shortness of breath., Disp: , Rfl:    aspirin  EC 81 MG tablet, Take 81 mg by mouth daily. Swallow whole., Disp: , Rfl:    cyclobenzaprine  (FLEXERIL ) 5 MG tablet, Take 5 mg by mouth 3 (three) times daily as needed for muscle spasms., Disp: , Rfl:    pentoxifylline  (TRENTAL ) 400 MG CR tablet, Take 1 tablet (400 mg total) by mouth 3 (three) times daily with meals., Disp: 90 tablet, Rfl: 3   allopurinol  (ZYLOPRIM ) 100 MG tablet, Take 1 tablet (100 mg total) by mouth daily. (Patient not taking: Reported on 12/17/2023), Disp: 30 tablet, Rfl: 0   atenolol  (TENORMIN ) 25 MG tablet, Take 25-50 mg by mouth See admin instructions. Take 50 mg (2 tablets) every morning and 25 mg (1 tablet) every evening. (Patient not taking: Reported on 12/17/2023), Disp: , Rfl:    atorvastatin  (LIPITOR) 20 MG tablet, Take 1 tablet (20 mg total) by mouth at bedtime. (Patient not taking: Reported on 12/17/2023), Disp: 30 tablet, Rfl: 0   budesonide-formoterol  (SYMBICORT) 160-4.5 MCG/ACT inhaler, Inhale 2 puffs into the lungs daily. (Patient not taking: Reported on 12/17/2023), Disp: , Rfl:    cephALEXin  (KEFLEX ) 500 MG capsule, Take 1 capsule (500 mg total) by mouth 3 (three) times daily. (Patient not taking: Reported on 12/17/2023), Disp: 30 capsule, Rfl: 0   donepezil  (ARICEPT ) 10 MG tablet, Take 10 mg by mouth at bedtime. (Patient not taking: Reported on 12/17/2023), Disp: , Rfl:    doxycycline  (VIBRA -TABS) 100 MG tablet, Take 1 tablet (100 mg total) by mouth 2 (two) times daily. (Patient not taking: Reported on 12/17/2023), Disp: 28 tablet, Rfl: 0   gabapentin  (NEURONTIN ) 300 MG capsule, Take 300 mg by mouth daily. (Patient not  taking: Reported on 12/17/2023), Disp: , Rfl:    levETIRAcetam  (KEPPRA ) 750 MG tablet, Take 1 tablet (750 mg total) by mouth 2 (two) times daily., Disp: 180 tablet, Rfl: 4   mirtazapine  (REMERON ) 7.5 MG tablet, Take 7.5 mg by mouth at bedtime. (Patient not taking: Reported on 12/17/2023), Disp: , Rfl:    Multiple Vitamins-Minerals (MULTIVITAMIN WOMEN 50+) TABS, Take 1 tablet by mouth at bedtime. (Patient not taking: Reported on 12/17/2023), Disp: , Rfl:    NIFEdipine  (PROCARDIA  XL/NIFEDICAL-XL) 90 MG 24 hr tablet, Take 90 mg by mouth daily. (Patient not taking: Reported on 12/17/2023), Disp: , Rfl:    TRESIBA  FLEXTOUCH 200 UNIT/ML FlexTouch Pen, Inject 30 Units into the skin in the morning and at bedtime. (Patient not taking: Reported on 12/17/2023), Disp: , Rfl:   PAST MEDICAL HISTORY: Past Medical History:  Diagnosis Date   Anxiety    Arthritis    back, arms, legs (03/24/2016)   Asthma    Chronic lower back pain    Colonic polyp    last  colonoscopy done in 2009 with normal results per medical record   Depressive disorder    Gastric polyp    Gout    has taken allopurinol  300mg  once daily in past   Headache    History of hiatal hernia    Hypertension    Migraine    none in awhile; might have a couple/year (03/24/2016)   Mixed hyperlipidemia    01/2016 Total chol 141, HDL 59, LDL 63, ration 1.1   Osteoarthritis    TIA (transient ischemic attack) 11/2014   Type II diabetes mellitus (HCC)     PAST SURGICAL HISTORY: Past Surgical History:  Procedure Laterality Date   ARTERY BIOPSY Right 08/12/2016   Procedure: BIOPSY TEMPORAL ARTERY;  Surgeon: Carlin FORBES Haddock, MD;  Location[facility] OR;  Service: Vascular;  Laterality: Right;  BIOPSY TEMPORAL ARTERY   BREAST BIOPSY Left ~ 2015   benign   CARPAL TUNNEL RELEASE Bilateral    DILATION AND CURETTAGE OF UTERUS     I & D EXTREMITY Bilateral 10/24/2022   Procedure: DEBRIDEMENT LEFT LEG AND RIGHT ANKLE;  Surgeon: Harden Jerona GAILS, MD;  Location: [facility] OR;   Service: Orthopedics;  Laterality: Bilateral;   IR CT HEAD LTD  06/11/2022   IR PERCUTANEOUS ART THROMBECTOMY/INFUSION INTRACRANIAL INC DIAG ANGIO  06/11/2022   IR US  GUIDE VASC ACCESS RIGHT  06/11/2022   KNEE ARTHROSCOPY Right 2003   in [facility]   LAPAROSCOPIC CHOLECYSTECTOMY     LOOP RECORDER INSERTION N/A 06/26/2022   Procedure: LOOP RECORDER INSERTION;  Surgeon: Inocencio Soyla Lunger, MD;  Location: [facility];  Service: Cardiovascular;  Laterality: N/A;   RADIOLOGY WITH ANESTHESIA N/A 06/11/2022   Procedure: IR WITH ANESTHESIA;  Surgeon: Radiologist, Medication, MD;  Location: [facility] OR;  Service: Radiology;  Laterality: N/A;   TUBAL LIGATION     VAGINAL HYSTERECTOMY  1982    FAMILY HISTORY: Family History  Problem Relation Age of Onset   Stroke Father    Stroke Brother    Cancer Other    Gout Mother    Hypertension Mother    Arthritis Mother    Asthma Mother    Stroke Brother     SOCIAL HISTORY: Social History   Socioeconomic History   Marital status: Married    Spouse name: Not on file   Number of children: Not on file   Years of education: 12+   Highest education level: Not on file  Occupational History   Not on file  Tobacco Use   Smoking status: Never   Smokeless tobacco: Never  Vaping Use   Vaping status: Never Used  Substance and Sexual Activity   Alcohol use: No   Drug use: No   Sexual activity: Never  Other Topics Concern   Not on file  Social History Narrative   Right handed   Caffeine use: none   Lives with husband   Recently moved from [place]    Social Drivers of Health   Financial Resource Strain: Not on file  Food Insecurity: No Food Insecurity (06/09/2023)   Hunger Vital Sign    Worried About Running Out of Food in the Last Year: Never true    Ran Out of Food in the Last Year: Never true  Transportation Needs: No Transportation Needs (06/09/2023)   PRAPARE - Administrator, Civil Service (Medical): No    Lack of  Transportation (Non-Medical): No  Physical Activity: Not on file  Stress: Not on file  Social  Connections: Not on file  Intimate Partner Violence: Not At Risk (06/09/2023)   Humiliation, Afraid, Rape, and Kick questionnaire    Fear of Current or Ex-Partner: No    Emotionally Abused: No    Physically Abused: No    Sexually Abused: No       PHYSICAL EXAM  There were no vitals filed for this visit.  There is no height or weight on file to calculate BMI.   General: The patient is well-developed and well-nourished and in no acute distress  HEENT:  Head is [place]/AT.  Sclera are anicteric.    Neck: No carotid bruits are noted.  The neck is nontender.  Cardiovascular: The heart has a regular rate and rhythm with a normal S1 and S2. There were no murmurs, gallops or rubs.    Skin: Extremities are without rash.  She has lymphedema in the legs and the legs are wrapped.    Neurologic Exam  Mental status: The patient is alert and oriented x 3 at the time of the examination. The patient has apparent normal recent and remote memory, with an apparently normal attention span and concentration ability.   Speech is normal.  Cranial nerves: Extraocular movements are full. Pupils are equal, round, and reactive to light and accomodation.  Visual fields are full.  Facial symmetry is present. There is good facial sensation to soft touch bilaterally.Facial strength is normal.  Trapezius and sternocleidomastoid strength is normal. No dysarthria is noted.  The tongue is midline, and the patient has symmetric elevation of the soft palate. No obvious hearing deficits are noted.  Motor:  Muscle bulk is normal.   Tone is normal. Strength is 4+/5 in the left arm.  Rapid alternating movements are reduced.  Strength is 4/5 in the left leg..   Sensory: Sensory testing is intact to pinprick, soft touch and vibration sensation in all 4 extremities except mildly reduced vibration sensation at the.  Coordination:  Cerebellar testing reveals good finger-nose-finger and heel-to-shin bilaterally.  Gait and station: She requires bilateral support to stand and cannot walk.   Reflexes: Deep tendon reflexes are symmetric and normal in the arms and trace at the knees, absent at the ankles.      DIAGNOSTIC DATA (LABS, IMAGING, TESTING) - I reviewed patient records, labs, notes, testing and imaging myself where available.  Lab Results  Component Value Date   WBC 7.0 12/10/2023   HGB 11.2 (L) 12/10/2023   HCT 35.7 (L) 12/10/2023   MCV 86.9 12/10/2023   PLT 373 12/10/2023      Component Value Date/Time   NA 139 12/10/2023 1018   K 3.7 12/10/2023 1018   CL 107 12/10/2023 1018   CO2 20 (L) 12/10/2023 1018   GLUCOSE 96 12/10/2023 1018   BUN 12 12/10/2023 1018   CREATININE 1.04 (H) 12/10/2023 1018   CREATININE 0.88 01/25/2016 0001   CALCIUM  9.3 12/10/2023 1018   PROT 8.5 (H) 12/10/2023 1018   ALBUMIN 2.9 (L) 12/10/2023 1018   AST 13 (L) 12/10/2023 1018   ALT 9 12/10/2023 1018   ALKPHOS 97 12/10/2023 1018   BILITOT 0.3 12/10/2023 1018   GFRNONAA 56 (L) 12/10/2023 1018   GFRAA >60 11/18/2019 0251   Lab Results  Component Value Date   CHOL 136 10/25/2022   HDL 46 10/25/2022   LDLCALC 69 10/25/2022   TRIG 104 10/25/2022   CHOLHDL 3.0 10/25/2022   Lab Results  Component Value Date   HGBA1C 6.4 (H) 06/09/2023   Lab  Results  Component Value Date   VITAMINB12 342 02/06/2021   Lab Results  Component Value Date   TSH 1.790 02/27/2020       ASSESSMENT AND PLAN  Middle cerebral artery embolism, right  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  Of note, she had similar episodes  back in December 2024.  At the time she was placed on Keppra  500 mg p.o. twice daily but she did not continue on the medication, though unclear when she discontinued.  We discussed the importance of staying on Keppra  and I sent in a prescription for 1 year supply.  The dose will be 750 mg p.o. twice daily.  She feels more tired and has cold intolerance and I will also check a thyroid  panel.  She will return to see us  in 1 year or sooner if there are new or worsening neurologic symptoms.  She should try to increase activity if possible.  Here for asking me to see this patient.  Please let me know if I can be of further assistance with her other patients in the future.  This visit is part of a comprehensive longitudinal care medical relationship regarding the patients primary diagnosis of seizure and related concerns.   Rekha Hobbins A. Vear, MD, [place] 12/17/2023, 5:25 PM Certified in Neurology, Clinical Neurophysiology, Sleep Medicine and Neuroimaging  [facility] [address] [place], [place] [address] [phone]

## 2023-12-18 ENCOUNTER — Ambulatory Visit: Payer: Self-pay | Admitting: Neurology

## 2023-12-18 LAB — THYROID PANEL WITH TSH
Free Thyroxine Index: 2.2 (ref 1.2–4.9)
T3 Uptake Ratio: 27 % (ref 24–39)
T4, Total: 8.1 ug/dL (ref 4.5–12.0)
TSH: 2.16 u[IU]/mL (ref 0.450–4.500)

## 2023-12-31 ENCOUNTER — Other Ambulatory Visit: Payer: Self-pay | Admitting: *Deleted

## 2023-12-31 DIAGNOSIS — I872 Venous insufficiency (chronic) (peripheral): Secondary | ICD-10-CM

## 2024-01-05 ENCOUNTER — Telehealth: Payer: Self-pay | Admitting: Family

## 2024-01-05 NOTE — Telephone Encounter (Signed)
 [name] from [facility] home health called and said that her wounds not improving with the current wound care. She wants to know what did you want to do? CB#[phone]

## 2024-01-08 NOTE — Telephone Encounter (Signed)
 Called [name], left a detailed message on her identified VM to let her know about the incident at pt's last appointment here in the office w/ calling EMS. [name] did clean her legs with vashe wound solution and rewrapped her. Didn't get much out of the appointment due to the emergency. I asked her on message what her thoughts were and what she would like to apply to pt's leg's and to give us  a call back to discuss.

## 2024-01-13 ENCOUNTER — Ambulatory Visit (HOSPITAL_COMMUNITY)
Admission: RE | Admit: 2024-01-13 | Discharge: 2024-01-13 | Disposition: A | Discharge status: Home / Self Care | Source: Ambulatory Visit | Attending: Physician Assistant | Admitting: Physician Assistant

## 2024-01-13 ENCOUNTER — Ambulatory Visit: Attending: Vascular Surgery | Admitting: Physician Assistant

## 2024-01-13 VITALS — BP 150/95 | HR 80 | Temp 97.9°F | Wt 214.0 lb

## 2024-01-13 DIAGNOSIS — I890 Lymphedema, not elsewhere classified: Secondary | ICD-10-CM

## 2024-01-13 DIAGNOSIS — I872 Venous insufficiency (chronic) (peripheral): Secondary | ICD-10-CM

## 2024-01-13 DIAGNOSIS — L97529 Non-pressure chronic ulcer of other part of left foot with unspecified severity: Secondary | ICD-10-CM | POA: Diagnosis not present

## 2024-01-13 DIAGNOSIS — L98499 Non-pressure chronic ulcer of skin of other sites with unspecified severity: Secondary | ICD-10-CM | POA: Diagnosis not present

## 2024-01-13 DIAGNOSIS — L97519 Non-pressure chronic ulcer of other part of right foot with unspecified severity: Secondary | ICD-10-CM

## 2024-01-14 NOTE — Progress Notes (Unsigned)
 Requested by:  Campbell Reynolds, NP [address] [place],  [place] [address]  Reason for consultation: BLE     History of Present Illness   [name] is a 73 y.o. (1950-12-07) female who presents for repeat evaluation of poorly healing bilateral lower extremity venous wounds. She has previously been evaluated by Dr. Sheree for venous insufficiency in the left lower extremity, which revealed reflux in the small saphenous vein.  It was felt that small saphenous vein ablation would be difficult for the patient and likely unhelpful in improving her edema. She also has lymphedema in both of her legs causing significant swelling.   She returns today for repeat evaluation.  Her bilateral lower extremity wounds remain under the care of of orthopedic surgery.  Unfortunately her venous wounds have continued to progress on both of her legs and feet.  These are not infected, however the wounds have widened on the dorsal aspect of her feet.  She states that her bilateral lower extremity swelling is still largely uncontrolled.  Unfortunately she does not elevate her legs at all.  She is mostly nonambulatory, however she can stand to transfer from the bed to the wheelchair.  She spends the majority of her day sitting in a wheelchair with her feet in a dependent position.  She also does not use any form of compression.   Past Medical History:  Diagnosis Date   Anxiety    Arthritis    back, arms, legs (03/24/2016)   Asthma    Chronic lower back pain    Colonic polyp    last colonoscopy done in 2009 with normal results per medical record   Depressive disorder    Gastric polyp    Gout    has taken allopurinol  300mg  once daily in past   Headache    History of hiatal hernia    Hypertension    Migraine    none in awhile; might have a couple/year (03/24/2016)   Mixed hyperlipidemia    01/2016 Total chol 141, HDL 59, LDL 63, ration 1.1   Osteoarthritis    TIA (transient ischemic attack) 11/2014    Type II diabetes mellitus (HCC)     Past Surgical History:  Procedure Laterality Date   ARTERY BIOPSY Right 08/12/2016   Procedure: BIOPSY TEMPORAL ARTERY;  Surgeon: Carlin FORBES Haddock, MD;  [facility];  Service: Vascular;  Laterality: Right;  BIOPSY TEMPORAL ARTERY   BREAST BIOPSY Left ~ 2015   benign   CARPAL TUNNEL RELEASE Bilateral    DILATION AND CURETTAGE OF UTERUS     I & D EXTREMITY Bilateral 10/24/2022   Procedure: DEBRIDEMENT LEFT LEG AND RIGHT ANKLE;  Surgeon: Harden Jerona GAILS, MD;  Location: [facility] OR;  Service: Orthopedics;  Laterality: Bilateral;   IR CT HEAD LTD  06/11/2022   IR PERCUTANEOUS ART THROMBECTOMY/INFUSION INTRACRANIAL INC DIAG ANGIO  06/11/2022   IR US  GUIDE VASC ACCESS RIGHT  06/11/2022   KNEE ARTHROSCOPY Right 2003   in [facility]   LAPAROSCOPIC CHOLECYSTECTOMY     LOOP RECORDER INSERTION N/A 06/26/2022   Procedure: LOOP RECORDER INSERTION;  Surgeon: Inocencio Soyla Lunger, MD;  Location: [facility] INVASIVE CV LAB;  Service: Cardiovascular;  Laterality: N/A;   RADIOLOGY WITH ANESTHESIA N/A 06/11/2022   Procedure: IR WITH ANESTHESIA;  Surgeon: Radiologist, Medication, MD;  Location: [facility] OR;  Service: Radiology;  Laterality: N/A;   TUBAL LIGATION     VAGINAL HYSTERECTOMY  1982    Social History  Socioeconomic History   Marital status: Married    Spouse name: Not on file   Number of children: Not on file   Years of education: 12+   Highest education level: Not on file  Occupational History   Not on file  Tobacco Use   Smoking status: Never   Smokeless tobacco: Never  Vaping Use   Vaping status: Never Used  Substance and Sexual Activity   Alcohol use: No   Drug use: No   Sexual activity: Never  Other Topics Concern   Not on file  Social History Narrative   Right handed   Caffeine use: none   Lives with husband   Recently moved from     Social Drivers of Health   Financial Resource Strain: Not on file  Food Insecurity: No Food Insecurity  (06/09/2023)   Hunger Vital Sign    Worried About Running Out of Food in the Last Year: Never true    Ran Out of Food in the Last Year: Never true  Transportation Needs: No Transportation Needs (06/09/2023)   PRAPARE - Administrator, Civil Service (Medical): No    Lack of Transportation (Non-Medical): No  Physical Activity: Not on file  Stress: Not on file  Social Connections: Not on file  Intimate Partner Violence: Not At Risk (06/09/2023)   Humiliation, Afraid, Rape, and Kick questionnaire    Fear of Current or Ex-Partner: No    Emotionally Abused: No    Physically Abused: No    Sexually Abused: No    Family History  Problem Relation Age of Onset   Stroke Father    Stroke Brother    Cancer Other    Gout Mother    Hypertension Mother    Arthritis Mother    Asthma Mother    Stroke Brother     Current Outpatient Medications  Medication Sig Dispense Refill   acetaminophen  (TYLENOL ) 500 MG tablet Take 1,000 mg by mouth every 6 (six) hours as needed for moderate pain (pain score 4-6) or mild pain (pain score 1-3).     albuterol  (VENTOLIN  HFA) 108 (90 Base) MCG/ACT inhaler Inhale 2 puffs into the lungs every 6 (six) hours as needed for wheezing or shortness of breath.     allopurinol  (ZYLOPRIM ) 100 MG tablet Take 1 tablet (100 mg total) by mouth daily. (Patient not taking: Reported on 12/17/2023) 30 tablet 0   aspirin  EC 81 MG tablet Take 81 mg by mouth daily. Swallow whole.     atenolol  (TENORMIN ) 25 MG tablet Take 25-50 mg by mouth See admin instructions. Take 50 mg (2 tablets) every morning and 25 mg (1 tablet) every evening. (Patient not taking: Reported on 12/17/2023)     atorvastatin  (LIPITOR) 20 MG tablet Take 1 tablet (20 mg total) by mouth at bedtime. (Patient not taking: Reported on 12/17/2023) 30 tablet 0   budesonide-formoterol  (SYMBICORT) 160-4.5 MCG/ACT inhaler Inhale 2 puffs into the lungs daily. (Patient not taking: Reported on 12/17/2023)     cephALEXin   (KEFLEX ) 500 MG capsule Take 1 capsule (500 mg total) by mouth 3 (three) times daily. (Patient not taking: Reported on 12/17/2023) 30 capsule 0   cyclobenzaprine  (FLEXERIL ) 5 MG tablet Take 5 mg by mouth 3 (three) times daily as needed for muscle spasms.     donepezil  (ARICEPT ) 10 MG tablet Take 10 mg by mouth at bedtime. (Patient not taking: Reported on 12/17/2023)     doxycycline  (VIBRA -TABS) 100 MG tablet Take 1 tablet (100  mg total) by mouth 2 (two) times daily. (Patient not taking: Reported on 12/17/2023) 28 tablet 0   gabapentin  (NEURONTIN ) 300 MG capsule Take 300 mg by mouth daily. (Patient not taking: Reported on 12/17/2023)     levETIRAcetam  (KEPPRA ) 750 MG tablet Take 1 tablet (750 mg total) by mouth 2 (two) times daily. 180 tablet 4   mirtazapine  (REMERON ) 7.5 MG tablet Take 7.5 mg by mouth at bedtime. (Patient not taking: Reported on 12/17/2023)     Multiple Vitamins-Minerals (MULTIVITAMIN WOMEN 50+) TABS Take 1 tablet by mouth at bedtime. (Patient not taking: Reported on 12/17/2023)     NIFEdipine  (PROCARDIA  XL/NIFEDICAL-XL) 90 MG 24 hr tablet Take 90 mg by mouth daily. (Patient not taking: Reported on 12/17/2023)     pentoxifylline  (TRENTAL ) 400 MG CR tablet Take 1 tablet (400 mg total) by mouth 3 (three) times daily with meals. 90 tablet 3   No current facility-administered medications for this visit.    Allergies  Allergen Reactions   Ace Inhibitors Swelling   Latex Itching and Swelling   Ultram [Tramadol] Nausea And Vomiting   Zanaflex [Tizanidine] Other (See Comments)    Tremors    Diprivan  [Propofol ] Itching   Metformin And Related Other (See Comments)    Tremors  Chills    REVIEW OF SYSTEMS (negative unless checked):   Cardiac:  []  Chest pain or chest pressure? []  Shortness of breath upon activity? []  Shortness of breath when lying flat? []  Irregular heart rhythm?  Vascular:  []  Pain in calf, thigh, or hip brought on by walking? []  Pain in feet at night that wakes you up  from your sleep? []  Blood clot in your veins? [x]  Leg swelling?  Pulmonary:  []  Oxygen at home? []  Productive cough? []  Wheezing?  Neurologic:  []  Sudden weakness in arms or legs? []  Sudden numbness in arms or legs? []  Sudden onset of difficult speaking or slurred speech? []  Temporary loss of vision in one eye? []  Problems with dizziness?  Gastrointestinal:  []  Blood in stool? []  Vomited blood?  Genitourinary:  []  Burning when urinating? []  Blood in urine?  Psychiatric:  []  Major depression  Hematologic:  []  Bleeding problems? []  Problems with blood clotting?  Dermatologic:  []  Rashes or ulcers?  Constitutional:  []  Fever or chills?  Ear/Nose/Throat:  []  Change in hearing? []  Nose bleeds? []  Sore throat?  Musculoskeletal:  []  Back pain? []  Joint pain? []  Muscle pain?   Physical Examination     Vitals:   01/13/24 1213  BP: (!) 150/95  Pulse: 80  Temp: 97.9 F (36.6 C)  TempSrc: Temporal  Weight: 214 lb (97.1 kg)   Body mass index is 36.73 kg/m.  General:  WDWN in NAD; vital signs documented above Gait: Not observed HENT: WNL, normocephalic Pulmonary: normal non-labored breathing  Cardiac: regular Abdomen: soft, NT, no masses Skin: without rashes Vascular Exam/Pulses: triphasic PT doppler signals bilaterally Extremities: large venous ulcerations to dorsum of bilateral feet, with healthy tissue. Extensive edema of bilateral lower legs and feet with hypertrophic skin Musculoskeletal: no muscle wasting or atrophy  Neurologic: A&O X 3      Non-invasive Vascular Imaging   LLE Venous Insufficiency Duplex (01/13/2024):  Reflux in the small saphenous vein at the [facility] and proximal calf.  No reflux in the remainder of the deep and superficial venous system   Medical Decision Making   YASSMIN BINEGAR is a 74 y.o. female who presents for repeat evaluation of poorly healing venous wounds  Based on the duplex, there is reflux in the left small  saphenous vein at the [facility] and proximal calf.  This is unchanged compared to her previous reflux study in 2022. The remainder of her deep and superficial venous system is competent. Again I do not think that ablation of the patient's small saphenous vein would significantly help with her swelling The patient has a history of bilateral lower extremity venous ulcerations for at least 3 years now. She continues to have recurrence and worsening of her wounds even with meticulous wound care On exam she has two large venous ulcerations to the dorsal aspect of her feet. These wounds appear healthy and without signs of infection. She also has edema of the lower legs and feet.  I have explained to the patient that I do not think venous ablation would offer much benefit to her. I also do not have any concern for arterial sufficiency given her triphasic doppler signals. Overall I believe the patient continues to have progression of her wounds due to poor lifestyle habits including lack of compression therapy and leg elevation  I have strongly encouraged the patient to elevate her legs above her heart regularly throughout the day. I would also like her to attempt some form of compressive therapy. She is agreeable to trying ACE compression wraps on her feet and legs ontop of her dressings. She can follow up with our office as needed   [name] SHAUNNA Holster, PA-C [facility] Office: [phone]  01/14/2024, 2:42 PM  Call MD: Magda

## 2024-01-20 ENCOUNTER — Encounter: Payer: Self-pay | Admitting: Family

## 2024-01-20 ENCOUNTER — Ambulatory Visit: Admitting: Family

## 2024-01-20 ENCOUNTER — Telehealth: Payer: Self-pay

## 2024-01-20 DIAGNOSIS — L97901 Non-pressure chronic ulcer of unspecified part of unspecified lower leg limited to breakdown of skin: Secondary | ICD-10-CM

## 2024-01-20 DIAGNOSIS — I890 Lymphedema, not elsewhere classified: Secondary | ICD-10-CM | POA: Diagnosis not present

## 2024-01-20 DIAGNOSIS — E08621 Diabetes mellitus due to underlying condition with foot ulcer: Secondary | ICD-10-CM | POA: Diagnosis not present

## 2024-01-20 DIAGNOSIS — L97419 Non-pressure chronic ulcer of right heel and midfoot with unspecified severity: Secondary | ICD-10-CM

## 2024-01-20 DIAGNOSIS — I87333 Chronic venous hypertension (idiopathic) with ulcer and inflammation of bilateral lower extremity: Secondary | ICD-10-CM | POA: Diagnosis not present

## 2024-01-20 DIAGNOSIS — L97511 Non-pressure chronic ulcer of other part of right foot limited to breakdown of skin: Secondary | ICD-10-CM

## 2024-01-20 NOTE — Telephone Encounter (Signed)
 Called [place] [phone] and sw Jenna. Pt called yesterday and wanted to be d/c from services. She states that she received a bill from [facility] for [facility] and she did not want to continue with service and so they discharged her yesterday. Pt in the office today and needs HHN to change dressing on left left once a week. They advised because she has been d/c she will need a new referral. Office note, demo sheet and order instruction and can fax to [phone] Will hold this message awaiting completion of dictation and then will fax over the notes.

## 2024-01-20 NOTE — Progress Notes (Signed)
 Office Visit Note   Patient: [name]           Date of Birth: [birth year]           MRN: [id number] Visit Date: 01/20/2024              Requested by: Campbell Reynolds, NP [address] [place],  [place] [address] PCP: Campbell Reynolds, NP  Chief Complaint  Patient presents with   Left Leg - Follow-up   Right Leg - Follow-up      HPI: The patient is a 73 year old woman who is seen in follow-up for chronic ulcerations bilateral lower extremities with venous insufficiency  The patient has poorly tolerated compression in the past she spends majority of her time up in a chair during the day with her legs dependent.  She does not elevate nor wear compression  She has recently been evaluated by vascular surgery  Today she tells me that she mainly cannot tolerate compression on the right lower extremity but can tolerate it on the left  She is concerned about worsening of her ulcers  Assessment & Plan: Visit Diagnoses: No diagnosis found.  Plan: Will plan for Vashe to dry dressing changes on the right with Ace for compression elevate is much as she is able we will reenlist the help of home health for wound care and  Follow-Up Instructions: No follow-ups on file.   Ortho Exam  Patient is alert, oriented, no adenopathy, well-dressed, normal affect, normal respiratory effort. On examination bilateral lower extremities there are chronic venous changes including hyperpigmentation trace edema with 1+ pitting weeping over the right anterior ankle ulceration present which measures 11 cm x 5 cm with 2 mm of depth this is filled in with granulation there is about 60% fibrinous exudative tissue in the wound bed there is epiboly to the wound edges left lower extremity with dorsal foot ulceration the proximal ulcer measures 3 cm x 2 and half centimeters with 3 mm of depth scant serous drainage distally over the dorsum of the foot there is a 4-1/2 x 3 cm ulcer with 4 mm of depth this is filled  in with fibrinous exudative tissue debrided with gauze today there is moderate serous drainage from this ulcer epiboly to the wound edges there is no erythema warmth or sign of active infection    Imaging: No results found. No images are attached to the encounter.  Labs: Lab Results  Component Value Date   HGBA1C 6.4 (H) 06/09/2023   HGBA1C 8.1 (H) 10/23/2022   HGBA1C 9.5 (H) 06/11/2022   ESRSEDRATE 110 (H) 05/20/2023   ESRSEDRATE 90 (H) 10/23/2022   ESRSEDRATE 117 (H) 03/06/2021   CRP 4.7 (H) 05/20/2023   CRP 3.8 (H) 10/23/2022   CRP 43.7 (H) 03/06/2021   LABURIC 7.7 (H) 05/20/2023   REPTSTATUS 05/24/2023 FINAL 05/19/2023   GRAMSTAIN  10/24/2022    ABUNDANT SQUAMOUS EPITHELIAL CELLS PRESENT FEW WBC PRESENT, PREDOMINANTLY PMN MODERATE GRAM POSITIVE RODS MODERATE GRAM POSITIVE COCCI FEW GRAM NEGATIVE RODS    CULT  05/19/2023    NO GROWTH 5 DAYS Performed at [facility], [address], [place], [place] [address]    LABORGA PROTEUS MIRABILIS 10/24/2022   LABORGA STAPHYLOCOCCUS AUREUS 10/24/2022   LABORGA PSEUDOMONAS AERUGINOSA 10/24/2022     Lab Results  Component Value Date   ALBUMIN 2.9 (L) 12/10/2023   ALBUMIN 3.1 (L) 06/09/2023   ALBUMIN 2.7 (L) 05/19/2023   PREALBUMIN 12.5 (L) 03/16/2017  Lab Results  Component Value Date   MG 2.1 05/20/2023   MG 2.1 06/27/2022   MG 1.9 06/25/2022   No results found for: [facility]  Lab Results  Component Value Date   PREALBUMIN 12.5 (L) 03/16/2017      Latest Ref Rng & Units 12/10/2023   10:18 AM 06/10/2023    5:54 AM 06/09/2023    9:07 AM  CBC EXTENDED  WBC 4.0 - 10.5 K/uL 7.0  5.5    RBC 3.87 - 5.11 MIL/uL 4.11  4.06    Hemoglobin 12.0 - 15.0 g/dL 88.7  88.2  87.0   HCT 36.0 - 46.0 % 35.7  36.8  38.0   Platelets 150 - 400 K/uL 373  240    NEUT# 1.7 - 7.7 K/uL 4.7     Lymph# 0.7 - 4.0 K/uL 1.5        There is no height or weight on file to calculate BMI.  Orders:  No orders of the defined types  were placed in this encounter.  No orders of the defined types were placed in this encounter.    Procedures: No procedures performed  Clinical Data: No additional findings.  ROS:  All other systems negative, except as noted in the HPI. Review of Systems  Objective: Vital Signs: There were no vitals taken for this visit.  Specialty Comments:  No specialty comments available.  PMFS History: Patient Active Problem List   Diagnosis Date Noted   Seizure (HCC) 06/10/2023   Stroke-like symptoms 06/09/2023   Complex partial seizure (HCC) 06/09/2023   Ulcer of extremity due to chronic venous insufficiency (HCC) 02/19/2023   Lymphedema 02/19/2023   SBO (small bowel obstruction) (HCC) 02/18/2023   GAD (generalized anxiety disorder) 02/18/2023   Asthma, chronic 02/18/2023   Skin ulcer due to diabetes mellitus (HCC) 11/11/2022   Encounter for change or removal of nonsurgical wound dressing 11/02/2022   Type 2 diabetes mellitus with diabetic peripheral angiopathy without gangrene (HCC) 11/02/2022   Non-prs chronic ulcer oth prt l low leg w fat layer exposed (HCC) 11/02/2022   Diabetic ulcer of left lower leg (HCC) 10/24/2022   Chronic venous hypertension (idiopathic) with ulcer and inflammation of bilateral lower extremity (HCC) 10/24/2022   Non-healing wound of lower extremity 10/23/2022   Bipolar affective disorder, currently depressed, moderate (HCC) 09/02/2022   Peripheral vascular disorder due to diabetes mellitus (HCC) 09/02/2022   Alzheimer's disease (HCC) 07/14/2022   Cramps of lower extremity 07/14/2022   Other specified diabetes mellitus with diabetic polyneuropathy (HCC) 07/14/2022   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/14/2022   Type 2 diabetes mellitus with peripheral angiopathy (HCC) 07/14/2022   Hyperglycemia due to type 2 diabetes mellitus (HCC) 07/14/2022   Bipolar disorder (HCC) 06/30/2022   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) 06/30/2022   Morbid (severe) obesity due to excess calories (HCC) 06/30/2022   Bacteria in urine 06/26/2022   Stasis ulcer of lower extremity (HCC) 06/26/2022   Elevated troponin 06/25/2022   Abnormal urinalysis 06/25/2022   History of CVA (cerebrovascular accident) 06/25/2022   Memory loss 06/25/2022   Unspecified dementia, unspecified severity, with anxiety (HCC) 06/16/2022   History of falling 06/16/2022   Long term (current) use of insulin  (HCC) 06/16/2022   Other chronic pain 06/16/2022   Body mass index (BMI) 50.0-59.9, adult (HCC) 06/16/2022   Cryptogenic stroke (HCC) 06/11/2022   Middle cerebral artery embolism, 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   Non-adherence to medical treatment 03/15/2022   Venous  insufficiency 03/15/2022   DM (diabetes mellitus), type 2 (HCC) 03/15/2022   Stasis dermatitis of right lower extremity with venous ulcer due to chronic peripheral venous hypertension (HCC) 03/15/2022   Venous stasis ulcers of both lower extremities (HCC) 03/15/2022   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   Wound infection 02/05/2021   Venous stasis dermatitis of both lower extremities    Pneumonia due to COVID-19 virus 06/29/2019   Hypophosphatemia 06/24/2019   Obesity 06/23/2019   Insulin  dependent type 2 diabetes mellitus (HCC) 06/22/2019   Anxiety 06/22/2019   Essential hypertension 06/22/2019   Hyperlipidemia 06/22/2019   COVID-19 06/21/2019   Severe protein-calorie malnutrition (HCC) 06/21/2019   Acute respiratory failure with hypoxia (HCC) 06/21/2019   Diabetic foot ulcer associated with type 2 diabetes mellitus (HCC)    Screening for cervical cancer 03/15/2017   Peripheral edema 09/09/2016   Venous stasis ulcer of left calf limited to breakdown of skin without varicose veins (HCC) 07/23/2016   Abnormal mammogram 07/16/2016   Cellulitis 07/05/2016   Depression 05/30/2016   Lower extremity ulceration (HCC) 05/30/2016   Generalized weakness 05/30/2016   Left  arm weakness 05/30/2016   Muscle spasm of both lower legs 05/19/2016   Neuropathy 05/19/2016   Sciatica of right side 05/19/2016   Diabetes mellitus (HCC) 05/19/2016   Left Lower extremity pain  04/02/2016   Lower extremity pain, inferior, left 04/02/2016   Left leg pain    Personal history of noncompliance with medical treatment, presenting hazards to health 03/31/2016   Jaw pain 03/27/2016   Left leg cellulitis 03/24/2016   Cellulitis of left lower extremity    Primary insomnia    Adjustment disorder    Morbid obesity (HCC) 01/27/2016   Wheelchair dependent 01/27/2016   Gout 01/27/2016   Arthritis 01/27/2016   Asthma 01/27/2016   Uncontrolled type 2 diabetes mellitus with hypoglycemia, with long-term current use of insulin  (HCC) 01/25/2016   Cellulitis of left leg 01/11/2016   Insulin -requiring or dependent type II diabetes mellitus (HCC) 01/11/2016   Hypertension 01/11/2016   Past Medical History:  Diagnosis Date   Anxiety    Arthritis    back, arms, legs (03/24/2016)   Asthma    Chronic lower back pain    Colonic polyp    last colonoscopy done in 2009 with normal results per medical record   Depressive disorder    Gastric polyp    Gout    has taken allopurinol  300mg  once daily in past   Headache    History of hiatal hernia    Hypertension    Migraine    none in awhile; might have a couple/year (03/24/2016)   Mixed hyperlipidemia    01/2016 Total chol 141, HDL 59, LDL 63, ration 1.1   Osteoarthritis    TIA (transient ischemic attack) 11/2014   Type II diabetes mellitus (HCC)     Family History  Problem Relation Age of Onset   Stroke Father    Stroke Brother    Cancer Other    Gout Mother    Hypertension Mother    Arthritis Mother    Asthma Mother    Stroke Brother     Past Surgical History:  Procedure Laterality Date   ARTERY BIOPSY Right 08/12/2016   Procedure: BIOPSY TEMPORAL ARTERY;  Surgeon: Carlin FORBES Haddock, MD;  Location: [facility] OR;  Service:  Vascular;  Laterality: Right;  BIOPSY TEMPORAL ARTERY   BREAST BIOPSY Left ~ 2015   benign   CARPAL TUNNEL RELEASE  Bilateral    DILATION AND CURETTAGE OF UTERUS     I & D EXTREMITY Bilateral 10/24/2022   Procedure: DEBRIDEMENT LEFT LEG AND RIGHT ANKLE;  Surgeon: Harden Jerona GAILS, MD;  Location: [facility] OR;  Service: Orthopedics;  Laterality: Bilateral;   IR CT HEAD LTD  06/11/2022   IR PERCUTANEOUS ART THROMBECTOMY/INFUSION INTRACRANIAL INC DIAG ANGIO  06/11/2022   IR US  GUIDE VASC ACCESS RIGHT  06/11/2022   KNEE ARTHROSCOPY Right 2003   in [facility]   LAPAROSCOPIC CHOLECYSTECTOMY     LOOP RECORDER INSERTION N/A 06/26/2022   Procedure: LOOP RECORDER INSERTION;  Surgeon: Inocencio Soyla Lunger, MD;  Location: [facility] INVASIVE CV LAB;  Service: Cardiovascular;  Laterality: N/A;   RADIOLOGY WITH ANESTHESIA N/A 06/11/2022   Procedure: IR WITH ANESTHESIA;  Surgeon: Radiologist, Medication, MD;  Location: [facility] OR;  Service: Radiology;  Laterality: N/A;   TUBAL LIGATION     VAGINAL HYSTERECTOMY  1982   Social History   Occupational History   Not on file  Tobacco Use   Smoking status: Never   Smokeless tobacco: Never  Vaping Use   Vaping status: Never Used  Substance and Sexual Activity   Alcohol use: No   Drug use: No   Sexual activity: Never

## 2024-01-21 NOTE — Telephone Encounter (Signed)
 All information faxed to number below for [facility] to do weekly compression dressings

## 2024-03-05 ENCOUNTER — Ambulatory Visit (HOSPITAL_COMMUNITY)
Admission: EM | Admit: 2024-03-05 | Discharge: 2024-03-05 | Disposition: A | Discharge status: Home / Self Care | Attending: Family Medicine | Admitting: Family Medicine

## 2024-03-05 ENCOUNTER — Emergency Department (HOSPITAL_COMMUNITY)
Admission: EM | Admit: 2024-03-05 | Discharge: 2024-03-05 | Discharge status: Against Medical Advice | Attending: Emergency Medicine | Admitting: Emergency Medicine

## 2024-03-05 ENCOUNTER — Emergency Department (HOSPITAL_COMMUNITY)

## 2024-03-05 ENCOUNTER — Encounter (HOSPITAL_COMMUNITY): Payer: Self-pay | Admitting: Emergency Medicine

## 2024-03-05 ENCOUNTER — Other Ambulatory Visit: Payer: Self-pay

## 2024-03-05 DIAGNOSIS — Z794 Long term (current) use of insulin: Secondary | ICD-10-CM | POA: Diagnosis not present

## 2024-03-05 DIAGNOSIS — R2243 Localized swelling, mass and lump, lower limb, bilateral: Secondary | ICD-10-CM | POA: Insufficient documentation

## 2024-03-05 DIAGNOSIS — Z5329 Procedure and treatment not carried out because of patient's decision for other reasons: Secondary | ICD-10-CM | POA: Insufficient documentation

## 2024-03-05 DIAGNOSIS — I10 Essential (primary) hypertension: Secondary | ICD-10-CM | POA: Diagnosis not present

## 2024-03-05 DIAGNOSIS — R531 Weakness: Secondary | ICD-10-CM | POA: Diagnosis not present

## 2024-03-05 DIAGNOSIS — Z91040 Latex allergy status: Secondary | ICD-10-CM | POA: Insufficient documentation

## 2024-03-05 DIAGNOSIS — Z7982 Long term (current) use of aspirin: Secondary | ICD-10-CM | POA: Diagnosis not present

## 2024-03-05 DIAGNOSIS — Z79899 Other long term (current) drug therapy: Secondary | ICD-10-CM | POA: Insufficient documentation

## 2024-03-05 DIAGNOSIS — E119 Type 2 diabetes mellitus without complications: Secondary | ICD-10-CM | POA: Insufficient documentation

## 2024-03-05 DIAGNOSIS — R29898 Other symptoms and signs involving the musculoskeletal system: Secondary | ICD-10-CM

## 2024-03-05 DIAGNOSIS — Z8673 Personal history of transient ischemic attack (TIA), and cerebral infarction without residual deficits: Secondary | ICD-10-CM

## 2024-03-05 LAB — CBC
HCT: 34.3 % — ABNORMAL LOW (ref 36.0–46.0)
Hemoglobin: 10.6 g/dL — ABNORMAL LOW (ref 12.0–15.0)
MCH: 26.6 pg (ref 26.0–34.0)
MCHC: 30.9 g/dL (ref 30.0–36.0)
MCV: 86 fL (ref 80.0–100.0)
Platelets: 394 K/uL (ref 150–400)
RBC: 3.99 MIL/uL (ref 3.87–5.11)
RDW: 15.4 % (ref 11.5–15.5)
WBC: 7.6 K/uL (ref 4.0–10.5)
nRBC: 0 % (ref 0.0–0.2)

## 2024-03-05 LAB — COMPREHENSIVE METABOLIC PANEL WITH GFR
ALT: 7 U/L (ref 0–44)
AST: 15 U/L (ref 15–41)
Albumin: 2.5 g/dL — ABNORMAL LOW (ref 3.5–5.0)
Alkaline Phosphatase: 85 U/L (ref 38–126)
Anion gap: 10 (ref 5–15)
BUN: 9 mg/dL (ref 8–23)
CO2: 22 mmol/L (ref 22–32)
Calcium: 9.2 mg/dL (ref 8.9–10.3)
Chloride: 108 mmol/L (ref 98–111)
Creatinine, Ser: 0.87 mg/dL (ref 0.44–1.00)
GFR, Estimated: >60 mL/min (ref >60)
Glucose, Bld: 104 mg/dL — ABNORMAL HIGH (ref 70–99)
Potassium: 3.9 mmol/L (ref 3.5–5.1)
Sodium: 140 mmol/L (ref 135–145)
Total Bilirubin: 0.3 mg/dL (ref 0.0–1.2)
Total Protein: 8.8 g/dL — ABNORMAL HIGH (ref 6.5–8.1)

## 2024-03-05 LAB — CBG MONITORING, ED: Glucose-Capillary: 88 mg/dL (ref 70–99)

## 2024-03-05 MED ORDER — DIAZEPAM 5 MG/ML IJ SOLN
2.5000 mg | Freq: Once | INTRAMUSCULAR | Status: DC
  Filled 2024-03-05: qty 2

## 2024-03-05 NOTE — ED Provider Notes (Signed)
 [facility]   [id number] 03/05/24 Arrival Time: 1338  ASSESSMENT & PLAN:  1. Left arm weakness   2. History of CVA (cerebrovascular accident)    Approx 12 hours since symptom onset. H/O CVA with residual LUE weakness but now with abrupt worsening weakness of LUE. Cannot r/o CVA. Without aphasia/dysarthria. Declines ambulance transport; husband will take her directly to ED via POV; stable upon discharge.  Recommend:  Follow-up Information     Go to  [facility] Emergency Department at [facility].   Specialty: Emergency Medicine Contact information: [address] [place]   [id number] [phone]                 Reviewed expectations re: course of current medical issues. Questions answered. Outlined signs and symptoms indicating need for more acute intervention. Patient verbalized understanding. After Visit Summary given.   SUBJECTIVE: History from: Patient and husband Patient is able to give a coherent history.  [name] is a 73 y.o. female who presents with complaint of a LUE weakness; noted approx 0200 this morning. H/O CVA in 2024 with residual LUE weakness. But my arm is a lot weaker since this morning. Denies extremity sensation changes. Denies headache.  Denies dizziness, loss of balance, speech difficulties, and vision problems. Denies fall/head injury.   OBJECTIVE:  Vitals:   03/05/24 1352  BP: (!) 150/78  Pulse: 83  Resp: 20  Temp: 98 F (36.7 C)  SpO2: 100%    General appearance: alert; in wheelchair Neurologic: alert; speech is fluent and clear without dysarthria or aphasia; with significant generalized and grip weakness of her LUE Psychological: alert and cooperative; normal mood and affect    Allergies  Allergen Reactions   Ace Inhibitors Swelling   Latex Itching and Swelling   Ultram [Tramadol] Nausea And Vomiting   Zanaflex [Tizanidine] Other (See Comments)    Tremors    Diprivan   [Propofol ] Itching   Metformin And Related Other (See Comments)    Tremors  Chills    Past Medical History:  Diagnosis Date   Anxiety    Arthritis    back, arms, legs (03/24/2016)   Asthma    Chronic lower back pain    Colonic polyp    last colonoscopy done in 2009 with normal results per medical record   Depressive disorder    Gastric polyp    Gout    has taken allopurinol  300mg  once daily in past   Headache    History of hiatal hernia    Hypertension    Migraine    none in awhile; might have a couple/year (03/24/2016)   Mixed hyperlipidemia    01/2016 Total chol 141, HDL 59, LDL 63, ration 1.1   Osteoarthritis    TIA (transient ischemic attack) 11/2014   Type II diabetes mellitus (HCC)    Social History   Socioeconomic History   Marital status: Married    Spouse name: Not on file   Number of children: Not on file   Years of education: 12+   Highest education level: Not on file  Occupational History   Not on file  Tobacco Use   Smoking status: Never   Smokeless tobacco: Never  Vaping Use   Vaping status: Never Used  Substance and Sexual Activity   Alcohol use: No   Drug use: No   Sexual activity: Never  Other Topics Concern   Not on file  Social History Narrative   Right handed   Caffeine  use: none   Lives with husband   Recently moved from [place]    Social Drivers of Health   Financial Resource Strain: Not on file  Food Insecurity: No Food Insecurity (06/09/2023)   Hunger Vital Sign    Worried About Running Out of Food in the Last Year: Never true    Ran Out of Food in the Last Year: Never true  Transportation Needs: No Transportation Needs (06/09/2023)   PRAPARE - Administrator, Civil Service (Medical): No    Lack of Transportation (Non-Medical): No  Physical Activity: Not on file  Stress: Not on file  Social Connections: Not on file  Intimate Partner Violence: Not At Risk (06/09/2023)   Humiliation, Afraid, Rape, and  Kick questionnaire    Fear of Current or Ex-Partner: No    Emotionally Abused: No    Physically Abused: No    Sexually Abused: No   Family History  Problem Relation Age of Onset   Stroke Father    Stroke Brother    Cancer Other    Gout Mother    Hypertension Mother    Arthritis Mother    Asthma Mother    Stroke Brother    Past Surgical History:  Procedure Laterality Date   ARTERY BIOPSY Right 08/12/2016   Procedure: BIOPSY TEMPORAL ARTERY;  Surgeon: Carlin FORBES Haddock, MD;  Location: [facility] OR;  Service: Vascular;  Laterality: Right;  BIOPSY TEMPORAL ARTERY   BREAST BIOPSY Left ~ 2015   benign   CARPAL TUNNEL RELEASE Bilateral    DILATION AND CURETTAGE OF UTERUS     I & D EXTREMITY Bilateral 10/24/2022   Procedure: DEBRIDEMENT LEFT LEG AND RIGHT ANKLE;  Surgeon: Harden Jerona GAILS, MD;  Location: [facility] OR;  Service: Orthopedics;  Laterality: Bilateral;   IR CT HEAD LTD  06/11/2022   IR PERCUTANEOUS ART THROMBECTOMY/INFUSION INTRACRANIAL INC DIAG ANGIO  06/11/2022   IR US  GUIDE VASC ACCESS RIGHT  06/11/2022   KNEE ARTHROSCOPY Right 2003   in [facility]   LAPAROSCOPIC CHOLECYSTECTOMY     LOOP RECORDER INSERTION N/A 06/26/2022   Procedure: LOOP RECORDER INSERTION;  Surgeon: Inocencio Soyla Lunger, MD;  Location: [facility] INVASIVE CV LAB;  Service: Cardiovascular;  Laterality: N/A;   RADIOLOGY WITH ANESTHESIA N/A 06/11/2022   Procedure: IR WITH ANESTHESIA;  Surgeon: Radiologist, Medication, MD;  Location: [facility] OR;  Service: Radiology;  Laterality: N/A;   TUBAL LIGATION     VAGINAL HYSTERECTOMY  1982      Rolinda Rogue, MD 03/05/24 [phone]

## 2024-03-05 NOTE — ED Triage Notes (Signed)
 C/o left arm weakness onset yest am, patient was able to hold her pocketbook and papers with a left arm

## 2024-03-05 NOTE — ED Triage Notes (Signed)
 Pt reports left arm weakness since last night. Reports weakness from arm from previous stroke but worsened where pt is unable to dress herself.

## 2024-03-05 NOTE — ED Notes (Signed)
 Patient is being discharged from the Urgent Care and sent to the Emergency Department via POV . Per Dr Rolinda, patient is in need of higher level of care due to stroke history and left arm weakness. Patient is aware and verbalizes understanding of plan of care.  Vitals:   03/05/24 1352  BP: (!) 150/78  Pulse: 83  Resp: 20  Temp: 98 F (36.7 C)  SpO2: 100%

## 2024-03-05 NOTE — ED Provider Notes (Signed)
 [facility] EMERGENCY DEPARTMENT AT [facility] Provider Note   CSN: [id number] Arrival date & time: 03/05/24  1426     Patient presents with: Weakness   [name] is a 73 y.o. female who presents to the emergency department after being seen at urgent care earlier today with a chief complaint of acute worsening of left upper extremity weakness.  Patient has a past medical history significant for previous CVA with left-sided deficit, however states that since approximately 2-3 PM yesterday she has had an acute worsening of her weakness in her left upper extremity.  Patient states that is to the point where she can now not put on clothes and dress herself.  Patient denies current blood thinning medication and states that she is supposed to be on aspirin  but is not always compliant.  Denies facial droop, slurred speech, visual disturbances, chest pain, shortness of breath, abdominal pain, nausea, vomiting.  Patient states that she is currently not in any pain at all just has this left upper extremity weakness.  Past medical history significant for lymphedema of bilateral lower extremities, cellulitis of left leg, type 2 diabetes on insulin , hyperlipidemia, hypertension, anxiety, CVA, nonhealing wounds of lower extremity, etc.    Weakness      Prior to Admission medications   Medication Sig Start Date End Date Taking? Authorizing Provider  acetaminophen  (TYLENOL ) 500 MG tablet Take 1,000 mg by mouth every 6 (six) hours as needed for moderate pain (pain score 4-6) or mild pain (pain score 1-3).    [provider]  albuterol  (VENTOLIN  HFA) 108 (90 Base) MCG/ACT inhaler Inhale 2 puffs into the lungs every 6 (six) hours as needed for wheezing or shortness of breath.    [provider]  allopurinol  (ZYLOPRIM ) 100 MG tablet Take 1 tablet (100 mg total) by mouth daily. Patient not taking: Reported on 12/17/2023 06/11/23   Cindy Garnette POUR, MD  aspirin  EC 81 MG tablet Take  81 mg by mouth daily. Swallow whole.    [provider]  atenolol  (TENORMIN ) 25 MG tablet Take 25-50 mg by mouth See admin instructions. Take 50 mg (2 tablets) every morning and 25 mg (1 tablet) every evening. Patient not taking: Reported on 12/17/2023    [provider]  atorvastatin  (LIPITOR) 20 MG tablet Take 1 tablet (20 mg total) by mouth at bedtime. Patient not taking: Reported on 12/17/2023 06/11/23 07/11/23  Cindy Garnette POUR, MD  budesonide-formoterol  [facility]) 160-4.5 MCG/ACT inhaler Inhale 2 puffs into the lungs daily. Patient not taking: Reported on 12/17/2023    [provider]  cephALEXin  (KEFLEX ) 500 MG capsule Take 1 capsule (500 mg total) by mouth 3 (three) times daily. Patient not taking: Reported on 12/17/2023 11/04/23   Zamora, Erin R, NP  cyclobenzaprine  (FLEXERIL ) 5 MG tablet Take 5 mg by mouth 3 (three) times daily as needed for muscle spasms. 06/03/22   [provider]  donepezil  (ARICEPT ) 10 MG tablet Take 10 mg by mouth at bedtime. Patient not taking: Reported on 12/17/2023    [provider]  doxycycline  (VIBRA -TABS) 100 MG tablet Take 1 tablet (100 mg total) by mouth 2 (two) times daily. Patient not taking: Reported on 12/17/2023 09/02/23   Zamora, Erin R, NP  gabapentin  (NEURONTIN ) 300 MG capsule Take 300 mg by mouth daily. Patient not taking: Reported on 12/17/2023    [provider]  levETIRAcetam  (KEPPRA ) 750 MG tablet Take 1 tablet (750 mg total) by mouth 2 (two) times daily. 12/17/23  Sater, Charlie LABOR, MD  mirtazapine  (REMERON ) 7.5 MG tablet Take 7.5 mg by mouth at bedtime. Patient not taking: Reported on 12/17/2023    [provider]  Multiple Vitamins-Minerals (MULTIVITAMIN WOMEN 50+) TABS Take 1 tablet by mouth at bedtime. Patient not taking: Reported on 12/17/2023    [provider]  NIFEdipine  (PROCARDIA  XL/NIFEDICAL-XL) 90 MG 24 hr tablet Take 90 mg by mouth daily. Patient not taking: Reported on 12/17/2023     [provider]  pentoxifylline  (TRENTAL ) 400 MG CR tablet Take 1 tablet (400 mg total) by mouth 3 (three) times daily with meals. 11/04/23   Zamora, Erin R, NP    Allergies: Ace inhibitors, Latex, Ultram [tramadol], Zanaflex [tizanidine], Diprivan  [propofol ], and Metformin and related    Review of Systems  Neurological:  Positive for weakness.    Updated Vital Signs BP 137/67 (BP Location: Right Arm)   Pulse 74   Temp (!) 97.5 F (36.4 C) (Oral)   Resp 18   Ht 5' 4 (1.626 m)   Wt 91.6 kg   SpO2 100%   BMI 34.67 kg/m   Physical Exam Vitals and nursing note reviewed.  Constitutional:      General: She is not in acute distress.    Appearance: She is not toxic-appearing or diaphoretic.     Comments: Chronically ill-appearing  HENT:     Head: Normocephalic and atraumatic.     Comments: No appreciated facial droop or slurred speech Eyes:     General: No visual field deficit or scleral icterus.    Extraocular Movements: Extraocular movements intact.     Pupils: Pupils are equal, round, and reactive to light.  Cardiovascular:     Rate and Rhythm: Normal rate and regular rhythm.  Pulmonary:     Effort: Pulmonary effort is normal. No respiratory distress.     Breath sounds: No wheezing, rhonchi or rales.  Musculoskeletal:     Right lower leg: Edema present.     Left lower leg: Edema present.     Comments: Significant lymphedema present to bilateral lower extremities, bilateral lower extremities are wrapped  On physical exam patient has weakness to left upper extremity most notable at wrist and left hand against resistance as well as gravity, patient has negative pronator drift, bilateral lower extremity strength equal, patient does have bilaterally equal upper extremity grip strength  Sensation of face equal bilaterally  Skin:    General: Skin is warm.     Capillary Refill: Capillary refill takes less than 2 seconds.  Neurological:     Mental Status: She is alert  and oriented to person, place, and time. Mental status is at baseline.     GCS: GCS eye subscore is 4. GCS verbal subscore is 5. GCS motor subscore is 6.     Cranial Nerves: No dysarthria or facial asymmetry.     Sensory: Sensation is intact. No sensory deficit.     Coordination: Coordination normal.  Psychiatric:        Mood and Affect: Mood normal.        Behavior: Behavior normal.     (all labs ordered are listed, but only abnormal results are displayed) Labs Reviewed  COMPREHENSIVE METABOLIC PANEL WITH GFR - Abnormal; Notable for the following components:      Result Value   Glucose, Bld 104 (*)    Total Protein 8.8 (*)    Albumin 2.5 (*)    All other components within normal limits  CBC - Abnormal; Notable  for the following components:   Hemoglobin 10.6 (*)    HCT 34.3 (*)    All other components within normal limits  CBG MONITORING, ED    EKG: None  Radiology: MR BRAIN WO CONTRAST Result Date: 03/05/2024 CLINICAL DATA:  Worsening left arm weakness.  History of stroke. EXAM: MRI HEAD WITHOUT CONTRAST TECHNIQUE: Multiplanar, multiecho pulse sequences of the brain and surrounding structures were obtained without intravenous contrast. COMPARISON:  Head CT 03/05/2024 and MRI 06/09/2023 FINDINGS: Brain: There is no evidence of an acute infarct, mass, midline shift, or extra-axial fluid collection. A moderate-sized chronic right MCA infarct is again noted involving the frontoparietal operculum and insula with associated chronic blood products and laminar necrosis. Small T2 hyperintensities scattered throughout the cerebral white matter bilaterally and in the pons are similar to the prior MRI and are nonspecific but compatible with mild chronic small vessel ischemic disease. No age advanced cerebral atrophy is evident. Vascular: Major intracranial vascular flow voids are preserved. Skull and upper cervical spine: Unremarkable bone marrow signal. Sinuses/Orbits: Unremarkable orbits. Clear  paranasal sinuses. Small bilateral mastoid effusions. Other: None. IMPRESSION: 1. No acute intracranial abnormality. 2. Chronic right MCA infarct. 3. Mild chronic small vessel ischemic disease. Electronically Signed   By: Dasie Hamburg M.D.   On: 03/05/2024 19:15   CT Head Wo Contrast Result Date: 03/05/2024 CLINICAL DATA:  Left arm weakness.  History of stroke. EXAM: CT HEAD WITHOUT CONTRAST TECHNIQUE: Contiguous axial images were obtained from the base of the skull through the vertex without intravenous contrast. RADIATION DOSE REDUCTION: This exam was performed according to the departmental dose-optimization program which includes automated exposure control, adjustment of the mA and/or kV according to patient size and/or use of iterative reconstruction technique. COMPARISON:  Head CT 12/10/2023 and MRI 06/09/2023 FINDINGS: Brain: There is no evidence of an acute infarct, intracranial hemorrhage, mass, midline shift, or extra-axial fluid collection. A moderate-sized chronic right MCA infarct is again noted involving the frontoparietal operculum and insula. Hypodensities elsewhere in the cerebral white matter bilaterally are similar to the prior CT and are nonspecific but compatible with mild chronic small vessel ischemic disease. No age advanced global atrophy is evident. Vascular: No hyperdense vessel. Skull: No acute fracture or suspicious lesion. Sinuses/Orbits: Visualized paranasal sinuses are clear. Small, chronic appearing bilateral mastoid effusions. Unremarkable included orbits. Other: None. IMPRESSION: 1. No evidence of acute intracranial abnormality. 2. Chronic right MCA infarct. 3. Mild chronic small vessel ischemic disease. Electronically Signed   By: Dasie Hamburg M.D.   On: 03/05/2024 16:57     Procedures   Medications Ordered in the ED  diazepam  (VALIUM ) injection 2.5 mg (2.5 mg Intravenous Not Given 03/05/24 1857)                                    Medical Decision Making Amount  and/or Complexity of Data Reviewed Labs: ordered. Radiology: ordered.  Risk Prescription drug management.   Patient presents to the ED for concern of left upper extremity weakness, this involves an extensive number of treatment options, and is a complaint that carries with it a high risk of complications and morbidity.  The differential diagnosis includes stroke, complicated migraine, TIA, inflammatory arthritis, osteoarthritis, trauma/injury, nerve impingement, etc.   Co morbidities that complicate the patient evaluation  lymphedema of bilateral lower extremities, cellulitis of left leg, type 2 diabetes on insulin , hyperlipidemia, hypertension, anxiety, CVA, nonhealing wounds of lower  extremity   Additional history obtained:  Viewed note from urgent care where patient was seen earlier today   Lab Tests:  I Ordered, and personally interpreted labs.  The pertinent results include: CBC significant for slightly decreased hemoglobin at 10.6, CMP overall reassuring, CBG noted to be 88   Imaging Studies ordered:  I ordered imaging studies including CT head, MRI brain I independently visualized and interpreted imaging which showed no acute evidence of stroke, chronic infarct noted, chronic ischemic changes I agree with the radiologist interpretation   Medicines ordered and prescription drug management:  I ordered medication including Valium  for MRI anxiety Reevaluation of the patient after these medicines showed that the patient improved I have reviewed the patients home medicines and have made adjustments as needed   Test Considered:  Imaging of left upper extremity: Declined at this time as patient denies trauma/injury, no significant pain with palpation of left upper extremity   Critical Interventions:  None   Problem List / ED Course:  73 year old female, presenting the emergency department with a chief complaint of left upper extremity weakness, previous CVA with  left-sided deficit however states that she has had an acute worsening of weakness of her left upper extremity since 2 to 3 PM yesterday, based off of this patient is out of acute stroke window and I do not believe this to be an LVO On physical exam patient does have some left upper extremity weakness when compared to right however bilateral grip strength of upper extremities appears intact, no deficit noted with lower extremities on my exam however patient does have extensive lymphedema present that is actively draining, patient denies fever, chills, patient states that she is following up outpatient for this and she keeps her feet wrapped, denies known areas of concern or infection on her feet or legs, denies visual changes, facial droop, slurred speech, changes in sensation Initial labs reassuring, hemoglobin 10.6 but this appears roughly baseline for patient, CT head without acute abnormality, due to acute onset of change yesterday with left upper extremity will obtain MRI brain MRI brain shows no acute infarct Prior to reassessing the patient and educating them about scan findings and other results, patient made the decision to leave the emergency department, I was unable to reassess this patient and determine if further workup was needed and I was unable to give return precautions and educate on results Unknown diagnosis at this time of left upper extremity however negative CTs and MRI brain, reassuring lab work, patient left prior to reassessment and further workup as well as education on results   Reevaluation:  After the interventions noted above, I reevaluated the patient and found that they have :stayed the same   Social Determinants of Health:  None   Dispostion:  Patient left prior to reassessment and education on results     Final diagnoses:  Left arm weakness    ED Discharge Orders     None          Janetta Terrall [place], [place] 03/05/24 2307    Pamella Ozell LABOR,  DO 03/07/24 1041

## 2024-03-05 NOTE — ED Notes (Signed)
 Pt stated she needed to get home and would follow up tomorrow. PA made aware and pt encouraged to stay for d/c paper work. Pt insisted on leaving dept.

## 2024-03-05 NOTE — ED Triage Notes (Signed)
 Noticed weakness last night or early this morning  in left arm.  Today required help getting dressed

## 2024-03-10 ENCOUNTER — Encounter: Payer: Self-pay | Admitting: Family Medicine

## 2024-03-29 ENCOUNTER — Encounter (HOSPITAL_COMMUNITY): Payer: Self-pay

## 2024-03-29 ENCOUNTER — Emergency Department (HOSPITAL_COMMUNITY)
Admission: EM | Admit: 2024-03-29 | Discharge: 2024-03-30 | Disposition: A | Discharge status: Home / Self Care | Source: Ambulatory Visit | Attending: Emergency Medicine | Admitting: Emergency Medicine

## 2024-03-29 ENCOUNTER — Emergency Department (HOSPITAL_COMMUNITY)

## 2024-03-29 ENCOUNTER — Other Ambulatory Visit: Payer: Self-pay

## 2024-03-29 DIAGNOSIS — Z5189 Encounter for other specified aftercare: Secondary | ICD-10-CM | POA: Diagnosis not present

## 2024-03-29 DIAGNOSIS — L97819 Non-pressure chronic ulcer of other part of right lower leg with unspecified severity: Secondary | ICD-10-CM | POA: Insufficient documentation

## 2024-03-29 DIAGNOSIS — L97829 Non-pressure chronic ulcer of other part of left lower leg with unspecified severity: Secondary | ICD-10-CM | POA: Diagnosis not present

## 2024-03-29 DIAGNOSIS — Z789 Other specified health status: Secondary | ICD-10-CM

## 2024-03-29 DIAGNOSIS — E119 Type 2 diabetes mellitus without complications: Secondary | ICD-10-CM | POA: Diagnosis not present

## 2024-03-29 DIAGNOSIS — I872 Venous insufficiency (chronic) (peripheral): Secondary | ICD-10-CM | POA: Insufficient documentation

## 2024-03-29 DIAGNOSIS — Z7982 Long term (current) use of aspirin: Secondary | ICD-10-CM | POA: Diagnosis not present

## 2024-03-29 DIAGNOSIS — Z8673 Personal history of transient ischemic attack (TIA), and cerebral infarction without residual deficits: Secondary | ICD-10-CM | POA: Insufficient documentation

## 2024-03-29 DIAGNOSIS — Z91040 Latex allergy status: Secondary | ICD-10-CM | POA: Insufficient documentation

## 2024-03-29 DIAGNOSIS — Z736 Limitation of activities due to disability: Secondary | ICD-10-CM | POA: Diagnosis not present

## 2024-03-29 DIAGNOSIS — Z79899 Other long term (current) drug therapy: Secondary | ICD-10-CM | POA: Diagnosis not present

## 2024-03-29 DIAGNOSIS — I10 Essential (primary) hypertension: Secondary | ICD-10-CM | POA: Insufficient documentation

## 2024-03-29 LAB — COMPREHENSIVE METABOLIC PANEL WITH GFR
ALT: 6 U/L (ref 0–44)
AST: 14 U/L — ABNORMAL LOW (ref 15–41)
Albumin: 3.4 g/dL — ABNORMAL LOW (ref 3.5–5.0)
Alkaline Phosphatase: 113 U/L (ref 38–126)
Anion gap: 10 (ref 5–15)
BUN: 11 mg/dL (ref 8–23)
CO2: 25 mmol/L (ref 22–32)
Calcium: 9.7 mg/dL (ref 8.9–10.3)
Chloride: 105 mmol/L (ref 98–111)
Creatinine, Ser: 0.81 mg/dL (ref 0.44–1.00)
GFR, Estimated: >60 mL/min (ref >60)
Glucose, Bld: 164 mg/dL — ABNORMAL HIGH (ref 70–99)
Potassium: 4 mmol/L (ref 3.5–5.1)
Sodium: 140 mmol/L (ref 135–145)
Total Bilirubin: 0.5 mg/dL (ref 0.0–1.2)
Total Protein: 8.8 g/dL — ABNORMAL HIGH (ref 6.5–8.1)

## 2024-03-29 LAB — CBC WITH DIFFERENTIAL/PLATELET
Abs Immature Granulocytes: 0.02 K/uL (ref 0.00–0.07)
Basophils Absolute: 0 K/uL (ref 0.0–0.1)
Basophils Relative: 0 %
Eosinophils Absolute: 0.3 K/uL (ref 0.0–0.5)
Eosinophils Relative: 3 %
HCT: 36.4 % (ref 36.0–46.0)
Hemoglobin: 10.7 g/dL — ABNORMAL LOW (ref 12.0–15.0)
Immature Granulocytes: 0 %
Lymphocytes Relative: 19 %
Lymphs Abs: 1.7 K/uL (ref 0.7–4.0)
MCH: 25.4 pg — ABNORMAL LOW (ref 26.0–34.0)
MCHC: 29.4 g/dL — ABNORMAL LOW (ref 30.0–36.0)
MCV: 86.3 fL (ref 80.0–100.0)
Monocytes Absolute: 0.5 K/uL (ref 0.1–1.0)
Monocytes Relative: 5 %
Neutro Abs: 6.5 K/uL (ref 1.7–7.7)
Neutrophils Relative %: 73 %
Platelets: 401 K/uL — ABNORMAL HIGH (ref 150–400)
RBC: 4.22 MIL/uL (ref 3.87–5.11)
RDW: 16.1 % — ABNORMAL HIGH (ref 11.5–15.5)
WBC: 9 K/uL (ref 4.0–10.5)
nRBC: 0 % (ref 0.0–0.2)

## 2024-03-29 MED ORDER — SODIUM CHLORIDE 0.9 % IV SOLN: 1.0000 g | Freq: Once | INTRAVENOUS | Status: DC

## 2024-03-29 MED ORDER — ASPIRIN 81 MG PO TBEC
81.0000 mg | DELAYED_RELEASE_TABLET | Freq: Every day | ORAL | Status: DC
  Administered 2024-03-30: 81 mg via ORAL
  Filled 2024-03-29: qty 1

## 2024-03-29 MED ORDER — LEVETIRACETAM 500 MG PO TABS
750.0000 mg | ORAL_TABLET | Freq: Two times a day (BID) | ORAL | Status: DC
  Administered 2024-03-29 – 2024-03-30 (×2): 750 mg via ORAL
  Filled 2024-03-29 (×2): qty 1

## 2024-03-29 NOTE — ED Provider Notes (Signed)
 [place] EMERGENCY DEPARTMENT AT [facility] Provider Note   CSN: [id number] Arrival date & time: 03/29/24  1615     Patient presents with: Wound Check   [name] is a 73 y.o. female who is wheelchair dependent with past medical history of insulin -dependent T2DM, HTN, HLD, CVA, seizure, bipolar affective disorder, lymphedema, bilateral chronic venous stasis ulcers presents to emergency department for evaluation of bilateral lower leg wounds that have been present for over three years. Last saw vascular on 01/13/2024 and had LLE venous insufficiency duplex showing reflux in the small saphenous vein at the STJ and proximal calf which is unchanged from previous reflux study in 2022. Wound care is managed by orthopedic surgery whom she saw last on 01/20/24. Does not have wound care or home health at home.    Wound Check       Prior to Admission medications   Medication Sig Start Date End Date Taking? Authorizing Provider  acetaminophen  (TYLENOL ) 500 MG tablet Take 1,000 mg by mouth every 6 (six) hours as needed for moderate pain (pain score 4-6) or mild pain (pain score 1-3).    [provider]  albuterol  (VENTOLIN  HFA) 108 (90 Base) MCG/ACT inhaler Inhale 2 puffs into the lungs every 6 (six) hours as needed for wheezing or shortness of breath.    [provider]  allopurinol  (ZYLOPRIM ) 100 MG tablet Take 1 tablet (100 mg total) by mouth daily. Patient not taking: Reported on 12/17/2023 06/11/23   Cindy Garnette POUR, MD  aspirin  EC 81 MG tablet Take 81 mg by mouth daily. Swallow whole.    [provider]  atenolol  (TENORMIN ) 25 MG tablet Take 25-50 mg by mouth See admin instructions. Take 50 mg (2 tablets) every morning and 25 mg (1 tablet) every evening. Patient not taking: Reported on 12/17/2023    [provider]  atorvastatin  (LIPITOR) 20 MG tablet Take 1 tablet (20 mg total) by mouth at bedtime. Patient not taking: Reported on 12/17/2023  06/11/23 07/11/23  Cindy Garnette POUR, MD  budesonide-formoterol  [facility]) 160-4.5 MCG/ACT inhaler Inhale 2 puffs into the lungs daily. Patient not taking: Reported on 12/17/2023    [provider]  cephALEXin  (KEFLEX ) 500 MG capsule Take 1 capsule (500 mg total) by mouth 3 (three) times daily. Patient not taking: Reported on 12/17/2023 11/04/23   Zamora, Erin R, NP  cyclobenzaprine  (FLEXERIL ) 5 MG tablet Take 5 mg by mouth 3 (three) times daily as needed for muscle spasms. 06/03/22   [provider]  donepezil  (ARICEPT ) 10 MG tablet Take 10 mg by mouth at bedtime. Patient not taking: Reported on 12/17/2023    [provider]  doxycycline  (VIBRA -TABS) 100 MG tablet Take 1 tablet (100 mg total) by mouth 2 (two) times daily. Patient not taking: Reported on 12/17/2023 09/02/23   Zamora, Erin R, NP  gabapentin  (NEURONTIN ) 300 MG capsule Take 300 mg by mouth daily. Patient not taking: Reported on 12/17/2023    [provider]  levETIRAcetam  (KEPPRA ) 750 MG tablet Take 1 tablet (750 mg total) by mouth 2 (two) times daily. 12/17/23   Sater, Charlie LABOR, MD  mirtazapine  (REMERON ) 7.5 MG tablet Take 7.5 mg by mouth at bedtime. Patient not taking: Reported on 12/17/2023    [provider]  Multiple Vitamins-Minerals (MULTIVITAMIN WOMEN 50+) TABS Take 1 tablet by mouth at bedtime. Patient not taking: Reported on 12/17/2023    [provider]  NIFEdipine  (PROCARDIA  XL/NIFEDICAL-XL) 90 MG 24 hr tablet Take 90  mg by mouth daily. Patient not taking: Reported on 12/17/2023    [provider]  pentoxifylline  (TRENTAL ) 400 MG CR tablet Take 1 tablet (400 mg total) by mouth 3 (three) times daily with meals. 11/04/23   Zamora, Erin R, NP    Allergies: Ace inhibitors, Latex, Ultram [tramadol], Zanaflex [tizanidine], Diprivan  [propofol ], and Metformin and related    Review of Systems  Skin:  Positive for wound.    Updated Vital Signs BP 116/64 (BP Location: Right Arm)    Pulse 75   Temp 98 F (36.7 C)   Resp 18   SpO2 98%   Physical Exam Vitals and nursing note reviewed.  Constitutional:      General: She is not in acute distress.    Appearance: Normal appearance.  HENT:     Head: Normocephalic and atraumatic.  Eyes:     Conjunctiva/sclera: Conjunctivae normal.  Cardiovascular:     Rate and Rhythm: Normal rate.  Pulmonary:     Effort: Pulmonary effort is normal. No respiratory distress.  Skin:    Coloration: Skin is not jaundiced or pale.  Neurological:     Mental Status: She is alert and oriented to person, place, and time. Mental status is at baseline.     (all labs ordered are listed, but only abnormal results are displayed) Labs Reviewed  CBC WITH DIFFERENTIAL/PLATELET - Abnormal; Notable for the following components:      Result Value   Hemoglobin 10.7 (*)    MCH 25.4 (*)    MCHC 29.4 (*)    RDW 16.1 (*)    Platelets 401 (*)    All other components within normal limits  COMPREHENSIVE METABOLIC PANEL WITH GFR - Abnormal; Notable for the following components:   Glucose, Bld 164 (*)    Total Protein 8.8 (*)    Albumin 3.4 (*)    AST 14 (*)    All other components within normal limits    EKG: None  Radiology: DG Tibia/Fibula Left Result Date: 03/29/2024 EXAM: _VIEWS_ VIEW(S) XRAY OF THE LEFT TIBIA AND FIBULA 03/29/2024 09:20:00 PM COMPARISON: Left tibiofibular series 05/19/2023. CLINICAL HISTORY: r/o osteo. Per pt notes: Pt reports with bilateral lower leg wounds that have been there for a year. r/o osteo. Per pt notes: Pt reports with bilateral lower leg wounds that have been there for a year. FINDINGS: BONES AND JOINTS: There is a linear periosteal reaction along the medial distal tibial shaft, but this was seen as far back as a study dated 07/20/2019 and seems unchanged. No destructive bone lesion is evident. No acute fracture. No joint dislocation. 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 SOFT TISSUES: Mild to moderate generalized edema again noted in the mid to distal foreleg minimally improved. Diffuse dystrophic soft tissue calcifications again are noted predominating in the pretibial area. Suggested shallow surface ulceration along the medial aspect at the level of the distal calf. No appreciable soft tissue gas. IMPRESSION: 1. No acute destructive bone lesion. 2. Mild to moderate generalized edema in the mid to distal foreleg, minimally improved. 3. Suggested shallow surface ulceration along the medial aspect at the level of the distal calf. 4. Diffuse dystrophic soft tissue calcifications, predominating in the pretibial area. Unchanged. 5. Linear periosteal reaction along the medial distal tibial shaft, unchanged since 07/20/2019. 6. Advanced degenerative arthrosis of the knee joint. Electronically signed by: Francis Quam MD 03/29/2024 09:44 PM EDT RP Workstation: [id number]   DG Tibia/Fibula Right  Result Date: 03/29/2024 EXAM: 2 VIEW(S) XRAY OF THE RIGHT TIBIA AND FIBULA 03/29/2024 09:20:00 PM COMPARISON: Right tibiofibular series 05/19/2023. CLINICAL HISTORY: r/o osteo. Per pt notes: Pt reports with bilateral lower leg wounds that have been there for a year. FINDINGS: BONES AND JOINTS: There is osteopenia without evidence of fractures or destructive bone lesions. There is advanced arthrosis of the knee joint, moderate arthrosis of the ankle. No erosive arthropathy or interval changes are seen. No joint dislocation. SOFT TISSUES: Mild to moderate generalized edema is again noted in the mid to distal foreleg. There is a shallow soft tissue ulceration again seen posteriorly in the distal foreleg, level of the junction of the mid and distal thirds of the tibia. There are multifocal dystrophic soft tissue calcifications scattered throughout the foreleg, majority in the pretibial area. No soft tissue gas is seen. IMPRESSION: 1. No acute osseous abnormality. Osteopenia. 2. Shallow  soft tissue ulceration posteriorly in the distal foreleg at the level of the junction of the mid and distal thirds of the tibia. 3. Mild to moderate generalized edema in the mid to distal foreleg. No appreciable change from last year. Multifocal Dystrophic soft tissue calcifications. 4. Advanced arthrosis of the knee joint. Electronically signed by: Francis Quam MD 03/29/2024 09:31 PM EDT RP Workstation: [id number]      Medications Ordered in the ED - No data to display                                  Medical Decision Making Amount and/or Complexity of Data Reviewed Labs: ordered. Radiology: ordered.   Patient presents to the ED for concern of BLE wounds, this involves an extensive number of treatment options, and is a complaint that carries with it a high risk of complications and morbidity.  The differential diagnosis includes osteo, cellulitis, chronic wounds   Co morbidities that complicate the patient evaluation  Chronic venous stasis   Additional history obtained:  Additional history obtained from Nursing and Outside Medical Records   External records from outside source obtained and reviewed including triage RN note, ortho note, vasc surg note,   Lab Tests:  I Ordered, and personally interpreted labs.  The pertinent results include:   No leukocytosis Hgb 10.7   Imaging Studies ordered:  I ordered imaging studies including left femur to obtain, left and right foot x-rays I independently visualized and interpreted imaging which showed  Chronic changes with no acute osseous lesions nor osteomyelitis See individual report for full impression I agree with the radiologist interpretation    Medicines ordered and prescription drug management:  I ordered home medications Reevaluation of the patient after these medicines showed that the patient stayed the same I have reviewed the patients home medicines and have made adjustments as needed    Consultations  Obtained:  I requested consultation with the PT, OT, TOC,  and discussed lab and imaging findings as well as pertinent plan - pending overnight   Problem List / ED Course:  Venous stasis ulcer of bilateral lower leg Does not appear much changed from the photos on 07/15/2023 Does not appear acutely infected with no erythema, warmth, streaking, discharge from chronic wounds.  No fever no tachycardia.  No leukocytosis. Sensation equal and intact of BLE X-rays without acute osseous abnormalities and is similar to previous XRs Do not see acute emergent cause of ulcers at this time Did provide ambulatory referral to wound clinic  Difficulty performing ADLs Medically clear Patient has significantly elderly husband who is occasionally able to help patient with ADLs, transferring, wrapping of leg wounds Is wheelchair dependent Patient will benefit from PT, OT to see if she would qualify for home health and/or SNF placement.  She recently lost home health and does not have anybody to help her at home for wound care, etc. Patient placed in boarder status pending PT, OT, TOC follow-up   Reevaluation:  After the interventions noted above, I reevaluated the patient and found that they have :stayed the same     Dispostion:  Dispo pending PT/OT, TOC recommendations. Medically clear. Reordered home meds and ordered diet order  Discussed ED workup, disposition, return to ED precautions with patient who expresses understanding agrees with plan.  All questions answered to their satisfaction.  They are agreeable to plan.  Discharge instructions provided on paperwork  Discussed patient with Dr.Trifan who reviewed ED workup and agrees with plan  Final diagnoses:  Visit for wound check  Venous stasis ulcer of other part of lower leg without varicose veins, unspecified laterality, unspecified ulcer stage (HCC)  Difficulty performing activity of daily living (ADL)    ED Discharge Orders           Ordered    Ambulatory referral to Wound Clinic        03/29/24 2117             Minnie Tinnie BRAVO, PA 03/29/24 2215    Cottie Donnice PARAS, MD 03/29/24 2352

## 2024-03-29 NOTE — Discharge Instructions (Addendum)
  [facility] *[name] will contact you within 2 days of your discharge from the hospital to arrange home health services* [address], [address] [place], [place], [address] [phone]

## 2024-03-29 NOTE — ED Triage Notes (Signed)
 Pt reports with bilateral lower leg wounds that have been there for a year.

## 2024-03-30 NOTE — ED Notes (Signed)
 Pt noted to be verbally abusive towards staff as they were attempting to assist her to the restroom.

## 2024-03-30 NOTE — Progress Notes (Signed)
   03/30/24 1455  TOC ED Mini Assessment  Interventions which prevented an admission or readmission Home Health Consult or Services  What brought you to the Emergency Department?  wound check  Barriers to Discharge Barriers Resolved  Barrier interventions Pt seen and treated in the ED. HH arranged for ongoing wound care.   [facility] RN referral accepted by [place].

## 2024-03-30 NOTE — Evaluation (Addendum)
 Physical Therapy Evaluation Patient Details Name: DEYNA CARBON MRN: [id number] DOB: 10/06/1950 Today's Date: 03/30/2024  History of Present Illness  NICKOLETTE ESPINOLA is a 73 y.o. female who is wheelchair dependent with bilateral chronic venous stasis ulcers presents to emergency department for evaluation of bilateral lower leg wounds that have been present for over three years. PMH: insulin -dependent T2DM, HTN, HLD, CVA, seizure, bipolar affective disorder, lymphedema, bilateral chronic venous stasis ulcers  Clinical Impression  Pt admitted with above diagnosis.  Pt currently with functional limitations due to the deficits listed below (see PT Problem List). Pt will benefit from acute skilled PT to increase their independence and safety with mobility to allow discharge.       The patient  was sleepy but aroused and able to participate in   answering personal history questions and participate in mobility . Patient CGA to pivot to BSC/stretcher. Patient able to stand with UE support  and supervision.  Patient  is nonambulatory, transfers only at baseline, and reports independent with transfers to Power chair or recliner and toilet. Patient resides with spouse. Patient has all DME needs at home.  Patient appears near baseline for tranfers.  No follow up PT  recommended at DC. Patient does  require  wound care for both LE's from nursing or Wound center.    If plan is discharge home, recommend the following: A little help with walking and/or transfers;Assistance with cooking/housework;Help with stairs or ramp for entrance;Assist for transportation;A little help with bathing/dressing/bathroom   Can travel by private vehicle        Equipment Recommendations None recommended by PT  Recommendations for Other Services       Functional Status Assessment Patient has not had a recent decline in their functional status     Precautions / Restrictions Precautions Precautions:  Fall Precaution/Restrictions Comments: foot wounds, usually wrapped Restrictions Weight Bearing Restrictions Per Provider Order: No      Mobility  Bed Mobility Overal bed mobility: Modified Independent, Needs Assistance Bed Mobility: Supine to Sit, Sit to Supine     Supine to sit: Supervision Sit to supine: Supervision   General bed mobility comments: no assistance    Transfers Overall transfer level: Needs assistance Equipment used: None Transfers: Sit to/from Stand, Bed to chair/wheelchair/BSC Sit to Stand: Contact guard assist Stand pivot transfers: Contact guard assist         General transfer comment: patient pivots to [facility], stands with supervision, supported on back of chair for pericare.    Ambulation/Gait                  Stairs            Wheelchair Mobility     Tilt Bed    Modified Rankin (Stroke Patients Only)       Balance Overall balance assessment: Mild deficits observed, not formally tested                                           Pertinent Vitals/Pain Pain Assessment Pain Assessment: Faces Faces Pain Scale: Hurts little more Pain Location: feet Pain Descriptors / Indicators: Guarding, Discomfort Pain Intervention(s): Monitored during session    Home Living Family/patient expects to be discharged to:: Private residence Living Arrangements: Spouse/significant other Available Help at Discharge: Available 24 hours/day;Family Type of Home: Apartment Home Access: Ramped entrance  Home Layout: One level Home Equipment: Wheelchair - power;Wheelchair - manual;Transport chair;Hospital bed;Grab bars - toilet;Grab bars - tub/shower;Hand held Armed forces logistics/support/administrative officer (2 wheels);Lift chair Additional Comments: transfers to power chair and back    Prior Function Prior Level of Function : Needs assist       Physical Assist : Mobility (physical);ADLs (physical) Mobility (physical):  Transfers ADLs (physical): IADLs;Dressing Mobility Comments: Transfer to power chair via stand pivots independently to tpoilet, car, recliner and bed ADLs Comments: occasional assist from husband with LBD, sponge bath, PWC fits into bathroom     Extremity/Trunk Assessment        Lower Extremity Assessment Lower Extremity Assessment: RLE deficits/detail;LLE deficits/detail RLE Deficits / Details: dorsal wounds, dry skin, able to bear weight LLE Deficits / Details: similar to right    Cervical / Trunk Assessment Cervical / Trunk Assessment: Normal;Other exceptions Cervical / Trunk Exceptions: body habitus  Communication   Communication Communication: No apparent difficulties    Cognition Arousal: Alert Behavior During Therapy: WFL for tasks assessed/performed   PT - Cognitive impairments: No apparent impairments                         Following commands: Intact       Cueing       General Comments      Exercises     Assessment/Plan    PT Assessment Patient needs continued PT services  PT Problem List Decreased skin integrity;Decreased activity tolerance;Decreased mobility       PT Treatment Interventions DME instruction;Therapeutic activities;Therapeutic exercise;Patient/family education    PT Goals (Current goals can be found in the Care Plan section)  Acute Rehab PT Goals Patient Stated Goal: go home, get wound care PT Goal Formulation: With patient Time For Goal Achievement: 04/13/24 Potential to Achieve Goals: Good    Frequency Min 2X/week     Co-evaluation PT/OT/SLP Co-Evaluation/Treatment: Yes Reason for Co-Treatment: For patient/therapist safety PT goals addressed during session: Mobility/safety with mobility;Balance;Proper use of DME OT goals addressed during session: ADL's and self-care;Proper use of Adaptive equipment and DME       AM-PAC PT 6 Clicks Mobility  Outcome Measure Help needed turning from your back to your side  while in a flat bed without using bedrails?: None Help needed moving from lying on your back to sitting on the side of a flat bed without using bedrails?: None Help needed moving to and from a bed to a chair (including a wheelchair)?: A Little Help needed standing up from a chair using your arms (e.g., wheelchair or bedside chair)?: A Little Help needed to walk in hospital room?: Total Help needed climbing 3-5 steps with a railing? : Total 6 Click Score: 16    End of Session Equipment Utilized During Treatment: Gait belt Activity Tolerance: Patient tolerated treatment well Patient left: in bed;with call bell/phone within reach Nurse Communication: Mobility status PT Visit Diagnosis: Unsteadiness on feet (R26.81);Difficulty in walking, not elsewhere classified (R26.2)    Time: 9179-9144 PT Time Calculation (min) (ACUTE ONLY): 35 min   Charges:   PT Evaluation $PT Eval Low Complexity: 1 Low   PT General Charges $$ ACUTE PT VISIT: 1 Visit         Darice Potters PT Acute Rehabilitation Services Office [phone]   Potters Darice Norris 03/30/2024, 9:24 AM

## 2024-03-30 NOTE — ED Provider Notes (Addendum)
 Emergency Medicine Observation Re-evaluation Note  [name] is a 73 y.o. female, seen on rounds today.  Pt initially presented to the ED for complaints of Wound Check Currently, the patient is in room 17 in our ED.  She is here with family at bedside.[name]  Physical Exam  BP (!) 165/77   Pulse 75   Temp 98 F (36.7 C)   Resp 18   SpO2 98%  Physical Exam General: Awake, alert Cardiac: Normal rate Lungs: Normal effort Psych: Mood is appropriate, pleasant.  ED Course / MDM  EKG:   I have reviewed the labs performed to date as well as medications administered while in observation.  Recent changes in the last 24 hours include medical clearance, currently being evaluated for home health needs.  Patient pleasant, interactive.  Has no needs at this time.  She states that she is hoping to find about her home health needs today so that she can be discharged..  Plan  Current plan is for home with home health.  Reassessment 3 PM-patient has been evaluated by PT and has had home health wound care arranged.  Patient is eager for discharge.  Will discharge.    Mannie Pac T, DO 03/30/24 1246    Mannie Pac T, DO 03/30/24 1500

## 2024-03-30 NOTE — Evaluation (Signed)
 Occupational Therapy Evaluation Patient Details Name: [name] MRN: [id number] DOB: December 09, 1950 Today's Date: 03/30/2024   History of Present Illness   [name] is a 73 y.o. female who is wheelchair dependent with bilateral chronic venous stasis ulcers presents to emergency department for evaluation of bilateral lower leg wounds that have been present for over three years. PMH: insulin -dependent T2DM, HTN, HLD, CVA, seizure, bipolar affective disorder, lymphedema, bilateral chronic venous stasis ulcers     Clinical Impressions PTA, patient lives at home with husband and had been receiving wound are via [facility] but then it ceased. Patient does not amb and pivots to and from [facility], hospital bed and power w/c and husband assists with LB self care and IADL's. Currently, patient presents with deficits outlined below (see OT Problem List for details) most significantly generalized muscle weakness, decreased activity tolerance, balance and skin integrity limiting BADL's and mobility.     Patient requires continued Acute care hospital level OT services to progress safety and functional performance and allow for discharge. Patient declined needs for follow up OT in home as she feels relatively at baseline with ADL's and husband assists with wound care being priority and appreciated MD guidance for resources for discharge. Patient endorses she has all necessary DME/AD in the home.       If plan is discharge home, recommend the following:   A lot of help with walking and/or transfers;A lot of help with bathing/dressing/bathroom;Assistance with cooking/housework;Assist for transportation;Help with stairs or ramp for entrance     Functional Status Assessment   Patient has had a recent decline in their functional status and demonstrates the ability to make significant improvements in function in a reasonable and predictable amount of time.     Equipment Recommendations   None recommended by  OT      Precautions/Restrictions   Precautions Precautions: Fall Precaution/Restrictions Comments: B LE wounds, usually wrapped Restrictions Weight Bearing Restrictions Per Provider Order: No     Mobility Bed Mobility Overal bed mobility: Needs Assistance Bed Mobility: Supine to Sit, Sit to Supine     Supine to sit: Supervision Sit to supine: Supervision        Transfers Overall transfer level: Needs assistance Equipment used: None Transfers: Sit to/from Stand, Bed to chair/wheelchair/BSC Sit to Stand: Contact guard assist Stand pivot transfers: Contact guard assist         General transfer comment: pivots to power w/c at baseline and used BSC this session      Balance Overall balance assessment: Mild deficits observed, not formally tested                                         ADL either performed or assessed with clinical judgement   ADL Overall ADL's : Needs assistance/impaired Eating/Feeding: Independent   Grooming: Wash/dry hands;Wash/dry face;Sitting;Modified independent   Upper Body Bathing: Contact guard assist;Sitting   Lower Body Bathing: Maximal assistance;Sit to/from stand   Upper Body Dressing : Minimal assistance;Sitting   Lower Body Dressing: Maximal assistance;Sitting/lateral leans   Toilet Transfer: Contact guard assist;BSC/3in1 Toilet Transfer Details (indicate cue type and reason): SPT Toileting- Clothing Manipulation and Hygiene: Moderate assistance;Sitting/lateral lean       Functional mobility during ADLs: Contact guard assist (SPT) General ADL Comments: decreased functional reach to LE's     Vision Baseline Vision/History: 1 Wears glasses;0 No visual deficits  Pertinent Vitals/Pain Pain Assessment Pain Assessment: Faces Faces Pain Scale: Hurts little more Pain Location: feet Pain Descriptors / Indicators: Discomfort, Guarding Pain Intervention(s): Monitored during session, Relaxation,  Repositioned     Extremity/Trunk Assessment Upper Extremity Assessment Upper Extremity Assessment: Right hand dominant;Generalized weakness   Lower Extremity Assessment Lower Extremity Assessment: RLE deficits/detail;LLE deficits/detail RLE Deficits / Details: dorsal wounds, dry skin, able to bear weight LLE Deficits / Details: similar to right   Cervical / Trunk Assessment Cervical / Trunk Assessment: Normal;Other exceptions Cervical / Trunk Exceptions: body habitus   Communication Communication Communication: No apparent difficulties   Cognition Arousal: Alert Behavior During Therapy: WFL for tasks assessed/performed Cognition: No apparent impairments             OT - Cognition Comments: reports she does not keep up with actual date but oherwise WFL                 Following commands: Intact       Cueing  General Comments   Cueing Techniques: Verbal cues  B LE distal ankles/foot wounds that are typically wrapped, painful, flaking, no SOB for session           Home Living Family/patient expects to be discharged to:: Private residence Living Arrangements: Spouse/significant other Available Help at Discharge: Available 24 hours/day;Family Type of Home: Apartment Home Access: Ramped entrance     Home Layout: One level     Bathroom Shower/Tub: Chief Strategy Officer: Standard Bathroom Accessibility: Yes How Accessible: Accessible via wheelchair Home Equipment: Wheelchair - power;Wheelchair - manual;Transport chair;Hospital bed;Grab bars - toilet;Grab bars - tub/shower;Hand held Armed forces logistics/support/administrative officer (2 wheels);Lift chair   Additional Comments: transfers to power chair and back      Prior Functioning/Environment Prior Level of Function : Needs assist       Physical Assist : Mobility (physical);ADLs (physical) Mobility (physical): Transfers ADLs (physical): IADLs;Dressing Mobility Comments: Transfer to power chair via  stand pivots independently to tpoilet, car, recliner and bed ADLs Comments: occasional assist from husband with LBD, sponge bath, PWC fits into bathroom    OT Problem List: Decreased strength;Decreased activity tolerance;Impaired balance (sitting and/or standing);Decreased safety awareness;Decreased knowledge of use of DME or AE;Decreased knowledge of precautions;Cardiopulmonary status limiting activity;Obesity;Pain;Increased edema   OT Treatment/Interventions: Self-care/ADL training;Therapeutic exercise;Energy conservation;DME and/or AE instruction;Therapeutic activities;Patient/family education;Balance training      OT Goals(Current goals can be found in the care plan section)   Acute Rehab OT Goals Patient Stated Goal: to get my wounds healed OT Goal Formulation: With patient Time For Goal Achievement: 04/13/24 Potential to Achieve Goals: Fair ADL Goals Pt Will Perform Lower Body Bathing: sit to/from stand;with min assist;with adaptive equipment Pt Will Perform Lower Body Dressing: with min assist;sit to/from stand Pt Will Transfer to Toilet: stand pivot transfer;with min assist Pt Will Perform Toileting - Clothing Manipulation and hygiene: with min assist;sitting/lateral leans   OT Frequency:  Min 2X/week    Co-evaluation PT/OT/SLP Co-Evaluation/Treatment: Yes Reason for Co-Treatment: For patient/therapist safety PT goals addressed during session: Mobility/safety with mobility;Balance;Proper use of DME OT goals addressed during session: ADL's and self-care;Proper use of Adaptive equipment and DME      AM-PAC OT 6 Clicks Daily Activity     Outcome Measure Help from another person eating meals?: None Help from another person taking care of personal grooming?: None Help from another person toileting, which includes using toliet, bedpan, or urinal?: A Lot Help from another person bathing (including washing, rinsing, drying)?:  A Lot Help from another person to put on and  taking off regular upper body clothing?: A Little Help from another person to put on and taking off regular lower body clothing?: A Lot 6 Click Score: 17   End of Session Equipment Utilized During Treatment: Gait belt Nurse Communication: Mobility status;Other (comment) (commode use)  Activity Tolerance: Patient limited by pain Patient left: in bed;with call bell/phone within reach;with bed alarm set  OT Visit Diagnosis: Unsteadiness on feet (R26.81);Other abnormalities of gait and mobility (R26.89);Muscle weakness (generalized) (M62.81);Pain Pain - part of body: Leg                Time: 0820-0858 OT Time Calculation (min): 38 min Charges:  OT General Charges $OT Visit: 1 Visit OT Evaluation $OT Eval Low Complexity: 1 Low  Chade Pitner OT/L Acute Rehabilitation Department  [phone]  03/30/2024, 10:07 AM

## 2024-04-04 ENCOUNTER — Inpatient Hospital Stay (HOSPITAL_COMMUNITY)
Admission: EM | Admit: 2024-04-04 | Discharge: 2024-04-12 | DRG: 264 | Disposition: A | Discharge status: Home Health | Source: Ambulatory Visit | Attending: Internal Medicine | Admitting: Internal Medicine

## 2024-04-04 ENCOUNTER — Encounter (HOSPITAL_COMMUNITY): Payer: Self-pay

## 2024-04-04 ENCOUNTER — Emergency Department (HOSPITAL_COMMUNITY)

## 2024-04-04 ENCOUNTER — Other Ambulatory Visit: Payer: Self-pay

## 2024-04-04 DIAGNOSIS — Z794 Long term (current) use of insulin: Secondary | ICD-10-CM

## 2024-04-04 DIAGNOSIS — L97319 Non-pressure chronic ulcer of right ankle with unspecified severity: Secondary | ICD-10-CM | POA: Diagnosis present

## 2024-04-04 DIAGNOSIS — Z888 Allergy status to other drugs, medicaments and biological substances status: Secondary | ICD-10-CM

## 2024-04-04 DIAGNOSIS — L97909 Non-pressure chronic ulcer of unspecified part of unspecified lower leg with unspecified severity: Secondary | ICD-10-CM | POA: Diagnosis not present

## 2024-04-04 DIAGNOSIS — Z6834 Body mass index (BMI) 34.0-34.9, adult: Secondary | ICD-10-CM

## 2024-04-04 DIAGNOSIS — S81801D Unspecified open wound, right lower leg, subsequent encounter: Secondary | ICD-10-CM | POA: Diagnosis not present

## 2024-04-04 DIAGNOSIS — L02612 Cutaneous abscess of left foot: Secondary | ICD-10-CM

## 2024-04-04 DIAGNOSIS — I890 Lymphedema, not elsewhere classified: Secondary | ICD-10-CM | POA: Diagnosis present

## 2024-04-04 DIAGNOSIS — F419 Anxiety disorder, unspecified: Secondary | ICD-10-CM | POA: Diagnosis present

## 2024-04-04 DIAGNOSIS — L97929 Non-pressure chronic ulcer of unspecified part of left lower leg with unspecified severity: Secondary | ICD-10-CM | POA: Diagnosis present

## 2024-04-04 DIAGNOSIS — Z7951 Long term (current) use of inhaled steroids: Secondary | ICD-10-CM

## 2024-04-04 DIAGNOSIS — E1142 Type 2 diabetes mellitus with diabetic polyneuropathy: Secondary | ICD-10-CM

## 2024-04-04 DIAGNOSIS — E1165 Type 2 diabetes mellitus with hyperglycemia: Secondary | ICD-10-CM | POA: Diagnosis present

## 2024-04-04 DIAGNOSIS — Z823 Family history of stroke: Secondary | ICD-10-CM

## 2024-04-04 DIAGNOSIS — I872 Venous insufficiency (chronic) (peripheral): Principal | ICD-10-CM | POA: Diagnosis present

## 2024-04-04 DIAGNOSIS — S81802D Unspecified open wound, left lower leg, subsequent encounter: Secondary | ICD-10-CM

## 2024-04-04 DIAGNOSIS — E1140 Type 2 diabetes mellitus with diabetic neuropathy, unspecified: Secondary | ICD-10-CM | POA: Diagnosis present

## 2024-04-04 DIAGNOSIS — J45909 Unspecified asthma, uncomplicated: Secondary | ICD-10-CM | POA: Diagnosis present

## 2024-04-04 DIAGNOSIS — L98491 Non-pressure chronic ulcer of skin of other sites limited to breakdown of skin: Secondary | ICD-10-CM | POA: Diagnosis present

## 2024-04-04 DIAGNOSIS — Z860100 Personal history of colon polyps, unspecified: Secondary | ICD-10-CM

## 2024-04-04 DIAGNOSIS — I10 Essential (primary) hypertension: Secondary | ICD-10-CM | POA: Diagnosis present

## 2024-04-04 DIAGNOSIS — I878 Other specified disorders of veins: Secondary | ICD-10-CM | POA: Diagnosis present

## 2024-04-04 DIAGNOSIS — M109 Gout, unspecified: Secondary | ICD-10-CM | POA: Diagnosis present

## 2024-04-04 DIAGNOSIS — Z7982 Long term (current) use of aspirin: Secondary | ICD-10-CM

## 2024-04-04 DIAGNOSIS — Z8261 Family history of arthritis: Secondary | ICD-10-CM

## 2024-04-04 DIAGNOSIS — Z825 Family history of asthma and other chronic lower respiratory diseases: Secondary | ICD-10-CM

## 2024-04-04 DIAGNOSIS — G40909 Epilepsy, unspecified, not intractable, without status epilepticus: Secondary | ICD-10-CM | POA: Diagnosis present

## 2024-04-04 DIAGNOSIS — Z8673 Personal history of transient ischemic attack (TIA), and cerebral infarction without residual deficits: Secondary | ICD-10-CM

## 2024-04-04 DIAGNOSIS — Z91040 Latex allergy status: Secondary | ICD-10-CM

## 2024-04-04 DIAGNOSIS — E782 Mixed hyperlipidemia: Secondary | ICD-10-CM | POA: Diagnosis present

## 2024-04-04 DIAGNOSIS — G8929 Other chronic pain: Secondary | ICD-10-CM | POA: Diagnosis present

## 2024-04-04 DIAGNOSIS — E66811 Obesity, class 1: Secondary | ICD-10-CM | POA: Diagnosis present

## 2024-04-04 DIAGNOSIS — F32A Depression, unspecified: Secondary | ICD-10-CM

## 2024-04-04 DIAGNOSIS — Z5189 Encounter for other specified aftercare: Principal | ICD-10-CM

## 2024-04-04 DIAGNOSIS — I83003 Varicose veins of unspecified lower extremity with ulcer of ankle: Secondary | ICD-10-CM | POA: Diagnosis not present

## 2024-04-04 DIAGNOSIS — Z8249 Family history of ischemic heart disease and other diseases of the circulatory system: Secondary | ICD-10-CM

## 2024-04-04 DIAGNOSIS — Z90710 Acquired absence of both cervix and uterus: Secondary | ICD-10-CM

## 2024-04-04 DIAGNOSIS — F03A4 Unspecified dementia, mild, with anxiety: Secondary | ICD-10-CM | POA: Diagnosis present

## 2024-04-04 DIAGNOSIS — S81801A Unspecified open wound, right lower leg, initial encounter: Secondary | ICD-10-CM

## 2024-04-04 DIAGNOSIS — I83009 Varicose veins of unspecified lower extremity with ulcer of unspecified site: Principal | ICD-10-CM | POA: Diagnosis present

## 2024-04-04 DIAGNOSIS — I87333 Chronic venous hypertension (idiopathic) with ulcer and inflammation of bilateral lower extremity: Secondary | ICD-10-CM

## 2024-04-04 DIAGNOSIS — Z79899 Other long term (current) drug therapy: Secondary | ICD-10-CM

## 2024-04-04 DIAGNOSIS — F03A3 Unspecified dementia, mild, with mood disturbance: Secondary | ICD-10-CM | POA: Diagnosis present

## 2024-04-04 DIAGNOSIS — F319 Bipolar disorder, unspecified: Secondary | ICD-10-CM | POA: Diagnosis present

## 2024-04-04 DIAGNOSIS — Z885 Allergy status to narcotic agent status: Secondary | ICD-10-CM

## 2024-04-04 DIAGNOSIS — S81802A Unspecified open wound, left lower leg, initial encounter: Secondary | ICD-10-CM

## 2024-04-04 LAB — CBC WITH DIFFERENTIAL/PLATELET
Abs Immature Granulocytes: 0.04 K/uL (ref 0.00–0.07)
Basophils Absolute: 0 K/uL (ref 0.0–0.1)
Basophils Relative: 0 %
Eosinophils Absolute: 0.2 K/uL (ref 0.0–0.5)
Eosinophils Relative: 2 %
HCT: 43.1 % (ref 36.0–46.0)
Hemoglobin: 12.9 g/dL (ref 12.0–15.0)
Immature Granulocytes: 0 %
Lymphocytes Relative: 20 %
Lymphs Abs: 1.8 K/uL (ref 0.7–4.0)
MCH: 26.3 pg (ref 26.0–34.0)
MCHC: 29.9 g/dL — ABNORMAL LOW (ref 30.0–36.0)
MCV: 87.8 fL (ref 80.0–100.0)
Monocytes Absolute: 0.5 K/uL (ref 0.1–1.0)
Monocytes Relative: 5 %
Neutro Abs: 6.6 K/uL (ref 1.7–7.7)
Neutrophils Relative %: 73 %
Platelets: 451 K/uL — ABNORMAL HIGH (ref 150–400)
RBC: 4.91 MIL/uL (ref 3.87–5.11)
RDW: 16.8 % — ABNORMAL HIGH (ref 11.5–15.5)
WBC: 9.1 K/uL (ref 4.0–10.5)
nRBC: 0 % (ref 0.0–0.2)

## 2024-04-04 LAB — COMPREHENSIVE METABOLIC PANEL WITH GFR
ALT: 13 U/L (ref 0–44)
AST: 17 U/L (ref 15–41)
Albumin: 3.3 g/dL — ABNORMAL LOW (ref 3.5–5.0)
Alkaline Phosphatase: 113 U/L (ref 38–126)
Anion gap: 14 (ref 5–15)
BUN: 8 mg/dL (ref 8–23)
CO2: 22 mmol/L (ref 22–32)
Calcium: 9.7 mg/dL (ref 8.9–10.3)
Chloride: 103 mmol/L (ref 98–111)
Creatinine, Ser: 0.89 mg/dL (ref 0.44–1.00)
GFR, Estimated: >60 mL/min (ref >60)
Glucose, Bld: 107 mg/dL — ABNORMAL HIGH (ref 70–99)
Potassium: 3.5 mmol/L (ref 3.5–5.1)
Sodium: 139 mmol/L (ref 135–145)
Total Bilirubin: 0.5 mg/dL (ref 0.0–1.2)
Total Protein: 10.2 g/dL — ABNORMAL HIGH (ref 6.5–8.1)

## 2024-04-04 LAB — URINALYSIS, W/ REFLEX TO CULTURE (INFECTION SUSPECTED)
Bacteria, UA: NONE SEEN
Bilirubin Urine: NEGATIVE
Glucose, UA: NEGATIVE mg/dL
Hgb urine dipstick: NEGATIVE
Ketones, ur: NEGATIVE mg/dL
Leukocytes,Ua: NEGATIVE
Nitrite: NEGATIVE
Protein, ur: NEGATIVE mg/dL
Specific Gravity, Urine: 1.003 — ABNORMAL LOW (ref 1.005–1.030)
pH: 7 (ref 5.0–8.0)

## 2024-04-04 LAB — I-STAT CG4 LACTIC ACID, ED
Lactic Acid, Venous: 3.1 mmol/L (ref 0.5–1.9)
Lactic Acid, Venous: 1.3 mmol/L (ref 0.5–1.9)

## 2024-04-04 LAB — PROTIME-INR
INR: 1.1 (ref 0.8–1.2)
Prothrombin Time: 14.6 s (ref 11.4–15.2)

## 2024-04-04 MED ORDER — BISACODYL 5 MG PO TBEC
5.0000 mg | DELAYED_RELEASE_TABLET | Freq: Every day | ORAL | Status: DC | PRN
  Administered 2024-04-10: 5 mg via ORAL
  Filled 2024-04-04: qty 1

## 2024-04-04 MED ORDER — ONDANSETRON HCL 4 MG/2ML IJ SOLN
4.0000 mg | Freq: Four times a day (QID) | INTRAMUSCULAR | Status: DC | PRN
  Administered 2024-04-10: 4 mg via INTRAVENOUS
  Filled 2024-04-04: qty 2

## 2024-04-04 MED ORDER — ACETAMINOPHEN 500 MG PO TABS
1000.0000 mg | ORAL_TABLET | Freq: Once | ORAL | Status: AC
  Administered 2024-04-04: 1000 mg via ORAL
  Filled 2024-04-04: qty 2

## 2024-04-04 MED ORDER — SENNOSIDES-DOCUSATE SODIUM 8.6-50 MG PO TABS: 1.0000 | ORAL_TABLET | Freq: Every evening | ORAL | Status: DC | PRN

## 2024-04-04 MED ORDER — ONDANSETRON HCL 4 MG PO TABS: 4.0000 mg | ORAL_TABLET | Freq: Four times a day (QID) | ORAL | Status: DC | PRN

## 2024-04-04 MED ORDER — ENOXAPARIN SODIUM 40 MG/0.4ML IJ SOSY
40.0000 mg | PREFILLED_SYRINGE | INTRAMUSCULAR | Status: DC
Start: 2024-03-29 — End: 2024-04-12
  Administered 2024-04-05 – 2024-04-12 (×7): 40 mg via SUBCUTANEOUS
  Filled 2024-04-04 (×7): qty 0.4

## 2024-04-04 MED ORDER — ACETAMINOPHEN 325 MG PO TABS: 650.0000 mg | ORAL_TABLET | Freq: Four times a day (QID) | ORAL | Status: DC | PRN

## 2024-04-04 MED ORDER — SODIUM CHLORIDE 0.9 % IV SOLN
1.0000 g | Freq: Once | INTRAVENOUS | Status: AC
  Administered 2024-04-04: 1 g via INTRAVENOUS
  Filled 2024-04-04: qty 10

## 2024-04-04 MED ORDER — ACETAMINOPHEN 650 MG RE SUPP: 650.0000 mg | Freq: Four times a day (QID) | RECTAL | Status: DC | PRN

## 2024-04-04 NOTE — ED Triage Notes (Signed)
 Pt sent here by doctor for an infected wound on left leg.  8/10 pain.  She also endorses feeling dizzy.

## 2024-04-04 NOTE — ED Notes (Signed)
 Bilateral leg wounds noted, assessed by ER MD.

## 2024-04-04 NOTE — H&P (Signed)
 History and Physical  [name] FMW:[id number] DOB: [birth year] DOA: 04/04/2024  PCP: [facility] [address]   Chief Complaint: Wound check  HPI: [name] is a 73 y.o. female with medical history significant for insulin -dependent T2DM, HTN, HLD, CVA, seizure, bipolar affective disorder, lymphedema, bilateral chronic venous stasis ulcers who was sent by PCP for evaluation of her chronic wounds.  Patient reports her wounds were previously managed by orthopedic surgery but has not been able to see them in the last few months. She was previously getting home health RN visits 3 times a week but currently down to once a week.  She has attempted to manage her wounds at home without success.  She was seen by her PCP today and advised to present to the ED due to concern about her wounds getting worse.  She endorsed mild chronic foot pain due to the wounds but denies any fevers, chills,, nausea, vomiting, abdominal pain or worsening leg swelling.  On review of chart, patient was seen in the ED on 10/15 2025, discharged directly from the ED with PT and home health wound care.  Patient reports the wound care nurses only visited her once.  ED Course: Initial vitals show patient afebrile HR 80-1 tens, SBP 130-150s, SpO2 97% on room air. Initial labs significant for normal white count, normal renal function and LFTs, lactic acid 3.1->1.3 UA with no signs of infection. EKG shows show sinus tach.  Right foot, right tibia/fibular and left foot x-rays show diffuse soft tissue swelling but no evidence of osteomyelitis.  Pt received IV Rocephin . TRH was consulted for admission.   Review of Systems: Please see HPI for pertinent positives and negatives. A complete 10 system review of systems are otherwise negative.  Past Medical History:  Diagnosis Date   Anxiety    Arthritis    back, arms, legs (03/24/2016)   Asthma    Chronic lower back pain    Colonic polyp    last colonoscopy done in 2009 with normal  results per medical record   Depressive disorder    Gastric polyp    Gout    has taken allopurinol  300mg  once daily in past   Headache    History of hiatal hernia    Hypertension    Migraine    none in awhile; might have a couple/year (03/24/2016)   Mixed hyperlipidemia    01/2016 Total chol 141, HDL 59, LDL 63, ration 1.1   Osteoarthritis    TIA (transient ischemic attack) 11/2014   Type II diabetes mellitus (HCC)    Past Surgical History:  Procedure Laterality Date   ARTERY BIOPSY Right 08/12/2016   Procedure: BIOPSY TEMPORAL ARTERY;  Surgeon: Carlin FORBES Haddock, MD;  Location: [facility] OR;  Service: Vascular;  Laterality: Right;  BIOPSY TEMPORAL ARTERY   BREAST BIOPSY Left ~ 2015   benign   CARPAL TUNNEL RELEASE Bilateral    DILATION AND CURETTAGE OF UTERUS     I & D EXTREMITY Bilateral 10/24/2022   Procedure: DEBRIDEMENT LEFT LEG AND RIGHT ANKLE;  Surgeon: Harden Jerona GAILS, MD;  Location: [facility] OR;  Service: Orthopedics;  Laterality: Bilateral;   IR CT HEAD LTD  06/11/2022   IR PERCUTANEOUS ART THROMBECTOMY/INFUSION INTRACRANIAL INC DIAG ANGIO  06/11/2022   IR US  GUIDE VASC ACCESS RIGHT  06/11/2022   KNEE ARTHROSCOPY Right 2003   in [facility]   LAPAROSCOPIC CHOLECYSTECTOMY     LOOP RECORDER INSERTION N/A 06/26/2022   Procedure: LOOP RECORDER INSERTION;  Surgeon:  Inocencio Soyla Lunger, MD;  Location: [facility] INVASIVE CV LAB;  Service: Cardiovascular;  Laterality: N/A;   RADIOLOGY WITH ANESTHESIA N/A 06/11/2022   Procedure: IR WITH ANESTHESIA;  Surgeon: Radiologist, Medication, MD;  Location: [facility] OR;  Service: Radiology;  Laterality: N/A;   TUBAL LIGATION     VAGINAL HYSTERECTOMY  1982   Social History:  reports that she has never smoked. She has never used smokeless tobacco. She reports that she does not drink alcohol and does not use drugs.  Allergies  Allergen Reactions   Ace Inhibitors Swelling   Latex Itching and Swelling   Ultram [Tramadol] Nausea And Vomiting   Zanaflex [Tizanidine] Other  (See Comments)    Tremors    Diprivan  [Propofol ] Itching   Metformin And Related Other (See Comments)    Tremors  Chills    Family History  Problem Relation Age of Onset   Stroke Father    Stroke Brother    Cancer Other    Gout Mother    Hypertension Mother    Arthritis Mother    Asthma Mother    Stroke Brother      Prior to Admission medications   Medication Sig Start Date End Date Taking? Authorizing Provider  allopurinol  (ZYLOPRIM ) 100 MG tablet Take 100 mg by mouth daily.    [provider]  aspirin  EC 81 MG tablet Take 81 mg by mouth daily. Swallow whole.    [provider]  atenolol  (TENORMIN ) 25 MG tablet Take by mouth daily.    [provider]  atorvastatin  (LIPITOR) 20 MG tablet Take 1 tablet (20 mg total) by mouth at bedtime. Patient not taking: Reported on 12/17/2023 06/11/23 07/11/23  Cindy Garnette POUR, MD  cephALEXin  (KEFLEX ) 500 MG capsule Take 500 mg by mouth 4 (four) times daily.    [provider]  colchicine  0.6 MG tablet Take by mouth. 03/22/24   [provider]  cyclobenzaprine  (FLEXERIL ) 5 MG tablet Take 5 mg by mouth.    [provider]  donepezil  (ARICEPT ) 10 MG tablet Take 10 mg by mouth at bedtime.    [provider]  gabapentin  (NEURONTIN ) 300 MG capsule Take 300 mg by mouth.    [provider]  HYDROcodone -acetaminophen  (NORCO) 10-325 MG tablet Take 1 tablet by mouth 2 (two) times daily as needed. 03/22/24   [provider]  levETIRAcetam  (KEPPRA ) 750 MG tablet Take 1 tablet (750 mg total) by mouth 2 (two) times daily. 12/17/23   Sater, Charlie LABOR, MD  mirtazapine  (REMERON ) 7.5 MG tablet Take 7.5 mg by mouth at bedtime.    [provider]  NIFEdipine  (ADALAT  CC) 90 MG 24 hr tablet Take 90 mg by mouth daily. 02/23/24   [provider]  pentoxifylline  (TRENTAL ) 400 MG CR tablet Take 1 tablet (400 mg total) by mouth 3 (three) times daily with meals. 11/04/23   Valdemar Rocky SAUNDERS, NP  sertraline (ZOLOFT) 50 MG tablet Take 50 mg by mouth daily. 03/22/24   [provider]    Physical Exam: BP 133/77   Pulse 81   Temp 98.1 F (36.7 C) (Oral)   Resp 18   Ht 5' 4 (1.626 m)   Wt 91.6 kg   SpO2 100%   BMI 34.66 kg/m  General: Pleasant, well-appearing elderly woman laying in bed. No acute distress. HEENT: [place]/AT. Anicteric sclera CV: RRR. No murmurs, rubs, or gallops. Pulmonary: Lungs CTAB. Normal effort. No wheezing or rales. Abdominal: Soft, nontender, nondistended. Normal bowel sounds.  Extremities: Chronic venous insufficiency and lymphedema. 1+ edema Skin: Warm and dry. Chronic venous stasis changes with hyperpigmentation and edema to the RLE.  Large ~ 11 cm x 6 cm ulceration on anterior aspect of the ankle with granulation tissue and fibrinous exudative tissue with no surrounding erythema or drainage.  Large superficial ulcer on the dorsal aspect of the left foot. (See below)  Neuro: A&Ox3. Moves all extremities. Normal sensation to light touch. No focal deficit. Psych: Normal mood and affect            Labs on Admission:  Basic Metabolic Panel: Recent Labs  Lab 03/29/24 1700 04/04/24 1639  NA 140 139  K 4.0 3.5  CL 105 103  CO2 25 22  GLUCOSE 164* 107*  BUN 11 8  CREATININE 0.81 0.89  CALCIUM  9.7 9.7   Liver Function Tests: Recent Labs  Lab 03/29/24 1700 04/04/24 1639  AST 14* 17  ALT 6 13  ALKPHOS 113 113  BILITOT 0.5 0.5  PROT 8.8* 10.2*  ALBUMIN 3.4* 3.3*   No results for input(s): LIPASE, AMYLASE in the last 168 hours. No results for input(s): AMMONIA in the last 168 hours. CBC: Recent Labs  Lab 03/29/24 1700 04/04/24 1639  WBC 9.0 9.1  NEUTROABS 6.5 6.6  HGB 10.7* 12.9  HCT 36.4 43.1  MCV 86.3 87.8  PLT 401* 451*   Cardiac Enzymes: No results for input(s): CKTOTAL, CKMB, CKMBINDEX, TROPONINI in the last 168 hours. BNP (last 3 results) No results for input(s): BNP in the last 8760  hours.  ProBNP (last 3 results) No results for input(s): PROBNP in the last 8760 hours.  CBG: No results for input(s): GLUCAP in the last 168 hours.  Radiological Exams on Admission: DG Tibia/Fibula Right Result Date: 04/04/2024 CLINICAL DATA:  Soft tissue wound, assess for osteomyelitis EXAM: RIGHT TIBIA AND FIBULA - 2 VIEW COMPARISON:  03/29/2024 FINDINGS: Frontal and lateral views of the right tibia and fibula are obtained. There is diffuse soft tissue swelling. No subcutaneous gas or radiopaque foreign body. There are no acute or destructive bony abnormalities. 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 IMPRESSION: 1. Stable diffuse soft tissue swelling. 2. No evidence of osteomyelitis. 3. Stable osteoarthritis. Electronically Signed   By: Ozell Daring M.D.   On: 04/04/2024 17:59   DG Foot Complete Left Result Date: 04/04/2024 CLINICAL DATA:  Soft tissue wound, concern for osteomyelitis EXAM: DG FOOT COMPLETE 3+V*L* COMPARISON:  03/29/2024 FINDINGS: Frontal, oblique, and lateral views of the left foot are obtained. Stable periarticular osteopenia. There are no acute displaced fractures. Multifocal osteoarthritis greatest in the midfoot. There is diffuse soft tissue edema. No subcutaneous gas or radiopaque foreign body. No bony destruction or periosteal reaction to suggest acute osteomyelitis. IMPRESSION: 1. Diffuse soft tissue swelling. No radiographic evidence of acute osteomyelitis. 2. Stable osteopenia and osteoarthritis. Electronically Signed   By: Ozell Daring M.D.   On: 04/04/2024 17:58   DG Foot Complete Right Result Date: 04/04/2024 CLINICAL DATA:  Wound, assess for osteomyelitis EXAM: RIGHT FOOT COMPLETE - 3+ VIEW COMPARISON:  03/29/2024 FINDINGS: Frontal, oblique, lateral views of the right foot are obtained. Continue periarticular osteopenia. There are no acute displaced fractures. Multifocal osteoarthritis greatest throughout the midfoot unchanged. There is diffuse  soft tissue swelling. Suspected ulceration overlying the dorsal aspect of the calcaneus. No subcutaneous gas or radiopaque foreign body. There is no bony destruction or periosteal reaction to suggest osteomyelitis. IMPRESSION: 1. Continued diffuse soft tissue swelling of the right  foot. No evidence of osteomyelitis. 2. Stable osteopenia and multifocal osteoarthritis. Electronically Signed   By: Ozell Daring M.D.   On: 04/04/2024 17:56   Assessment/Plan [name] is a 73 y.o. female with medical history significant for mild dementia, insulin -dependent T2DM, HTN, HLD, CVA, seizure, bipolar affective disorder, lymphedema, bilateral chronic venous stasis ulcers who was sent by PCP for evaluation of her chronic wounds and admitted for chronic venous stasis ulcers.  # Chronic leg wounds # Chronic venous stasis ulcers - Patient with chronic leg wounds sent by PCP due to concern about worsening wounds - On exam, patient has chronic venous stasis ulcers that has not healed appropriately - X-ray reveals soft tissue swelling but no osteomyelitis, no signs of cellulitis on exam - Will hold off on further antibiotics and consult wound care - Secure chat sent to Dr. Harden for evaluation and recommendation prior to discharge - Pain control with scheduled Flexeril , Tylenol  and as needed oxycodone  - Follow-up repeat ABI  # Chronic venous insufficiency # Lymphedema - On chart review, patient evaluated by vascular surgery 3 months ago and the duplex study showed reflux in the small saphenous vein at the STJ and proximal calf, - Patient was advised during this visit that venous ablation would not offer much benefit to her, she was advised to continue wound care, compression therapy and leg elevation - Follow-up with vascular surgery in the outpatient  # T2DM - Last A1c 6.4% 10 months ago, blood glucose stable at 107 on admission - SSI with meals, CBG monitoring - Follow-up repeat A1c  # HTN - BP  slightly elevated with SBP in the 130s to 150s - Continue atenolol  and nifedipine   # Neuropathy - Continue gabapentin   # HLD - Continue atorvastatin   # Bipolar disorder # Anxiety and depression - Continue sertraline and mirtazapine   # Gout - Continue allopurinol  and colchicine   Resume the rest of her meds after completion of med rec  DVT prophylaxis: Lovenox      Code Status: Full Code  Consults called: Dr. Harden  Family Communication: Discussed admission with husband at bedside  Severity of Illness: The appropriate patient status for this patient is OBSERVATION. Observation status is judged to be reasonable and necessary in order to provide the required intensity of service to ensure the patient's safety. The patient's presenting symptoms, physical exam findings, and initial radiographic and laboratory data in the context of their medical condition is felt to place them at decreased risk for further clinical deterioration. Furthermore, it is anticipated that the patient will be medically stable for discharge from the hospital within 2 midnights of admission.   Level of care: Med-Surg    Lou Claretta CHRISTELLA, MD 04/05/2024, 1:45 AM [facility] Pager: [phone] Isaiah 41:10   If 7PM-7AM, please contact night-coverage [email or web address] Password [email or web address]

## 2024-04-04 NOTE — ED Triage Notes (Signed)
 Pt reports worsening wounds on lower extremities. Pt denies fevers. Reports dizziness as well that started this morning. Pt is a diabetic. She is AxOx4.

## 2024-04-04 NOTE — ED Notes (Signed)
 Assuming pt care, pt bib wheelchair/family for bilateral leg wound x 1 year+, referred by home health for wound eval., wounds have worsen in past wk. Pt med hx. DM, HTN, hx of stroke, left side weakness deficit from past stroke, uses wheelchair at home. Pt has c/o headache, assessed by ER MD. Call bell within reach, family at bedside

## 2024-04-04 NOTE — ED Provider Triage Note (Signed)
 Emergency Medicine Provider Triage Evaluation Note  JOVI ALVIZO , a 73 y.o. female  was evaluated in triage.  Pt complains of worsening wounds to her lower extremities.  States she has had wounds to her lower extremities for the past couple months.  She reports some drainage out of these areas.  Denies any fever or chills at home.  Review of Systems  Positive: As above Negative: As above  Physical Exam  BP (!) 156/84   Pulse (!) 110   Temp 98.4 F (36.9 C)   Resp 18   SpO2 97%  Gen:   Awake, no distress   Resp:  Normal effort  MSK:   Moves extremities without difficulty    Medical Decision Making  Medically screening exam initiated at 4:59 PM.  Appropriate orders placed.  TINZLEY DALIA was informed that the remainder of the evaluation will be completed by another provider, this initial triage assessment does not replace that evaluation, and the importance of remaining in the ED until their evaluation is complete.  Suspected sepsis order set started.  Will obtain x-ray imaging of the lower extremities to begin with for possible osteomyelitis.   Veta Palma, PA-C 04/04/24 1659

## 2024-04-04 NOTE — ED Provider Notes (Signed)
 [place] EMERGENCY DEPARTMENT AT [facility] Provider Note   CSN: [id number] Arrival date & time: 04/04/24  1606     Patient presents with: Wound Check and Dizziness   [name] is a 73 y.o. female.    Wound Check  Dizziness      Presents because of increasing wound wounds of the lower extremities.  Patient states that she has a home health nurse come by once a week.  Concerned that the wounds are getting worse or sent to ED for further evaluation.  Dorsal pain to these lower wounds.  No chest pain or shortness of breath.  No prior chest pain or hemoptysis.  Denies a history of DVT or PE.  No obvious fevers at home.  Previous medical history reviewed : Patient was last seen in the ED on October 15 thousand 25.  Patient was discharged with PT and home health wound care arranged.   Prior to Admission medications   Medication Sig Start Date End Date Taking? Authorizing Provider  allopurinol  (ZYLOPRIM ) 100 MG tablet Take 100 mg by mouth daily.    [provider]  aspirin  EC 81 MG tablet Take 81 mg by mouth daily. Swallow whole.    [provider]  atenolol  (TENORMIN ) 25 MG tablet Take by mouth daily.    [provider]  atorvastatin  (LIPITOR) 20 MG tablet Take 1 tablet (20 mg total) by mouth at bedtime. Patient not taking: Reported on 12/17/2023 06/11/23 07/11/23  Cindy Garnette POUR, MD  cephALEXin  (KEFLEX ) 500 MG capsule Take 500 mg by mouth 4 (four) times daily.    [provider]  colchicine  0.6 MG tablet Take by mouth. 03/22/24   [provider]  cyclobenzaprine  (FLEXERIL ) 5 MG tablet Take 5 mg by mouth.    [provider]  donepezil  (ARICEPT ) 10 MG tablet Take 10 mg by mouth at bedtime.    [provider]  gabapentin  (NEURONTIN ) 300 MG capsule Take 300 mg by mouth.    [provider]  HYDROcodone -acetaminophen  (NORCO) 10-325 MG tablet Take 1 tablet by mouth 2 (two) times daily as needed.  03/22/24   [provider]  levETIRAcetam  (KEPPRA ) 750 MG tablet Take 1 tablet (750 mg total) by mouth 2 (two) times daily. 12/17/23   Sater, Charlie LABOR, MD  mirtazapine  (REMERON ) 7.5 MG tablet Take 7.5 mg by mouth at bedtime.    [provider]  NIFEdipine  (ADALAT  CC) 90 MG 24 hr tablet Take 90 mg by mouth daily. 02/23/24   [provider]  pentoxifylline  (TRENTAL ) 400 MG CR tablet Take 1 tablet (400 mg total) by mouth 3 (three) times daily with meals. 11/04/23   Valdemar Rocky SAUNDERS, NP  sertraline (ZOLOFT) 50 MG tablet Take 50 mg by mouth daily. 03/22/24   [provider]    Allergies: Ace inhibitors, Latex, Ultram [tramadol], Zanaflex [tizanidine], Diprivan  [propofol ], and Metformin and related    Review of Systems  Neurological:  Positive for dizziness.    Updated Vital Signs BP (!) 140/87 (BP Location: Left Arm)   Pulse 87   Temp 98 F (36.7 C) (Oral)   Resp 20   Ht 5' 4 (1.626 m)   Wt 91.6 kg   SpO2 100%   BMI 34.66 kg/m   Physical Exam Vitals and nursing note reviewed.  Constitutional:      General: She is not in acute distress.    Appearance: She is well-developed.  HENT:     Head: Normocephalic  and atraumatic.  Eyes:     Conjunctiva/sclera: Conjunctivae normal.  Cardiovascular:     Rate and Rhythm: Normal rate and regular rhythm.     Heart sounds: No murmur heard. Pulmonary:     Effort: Pulmonary effort is normal. No respiratory distress.     Breath sounds: Normal breath sounds.  Abdominal:     Palpations: Abdomen is soft.     Tenderness: There is no abdominal tenderness.  Musculoskeletal:        General: No swelling.     Cervical back: Neck supple.  Skin:    General: Skin is warm and dry.     Capillary Refill: Capillary refill takes less than 2 seconds.  Neurological:     Mental Status: She is alert.  Psychiatric:        Mood and Affect: Mood normal.     Media Information  Document Information  Photos    04/04/2024 19:18   Attached To:  Hospital Encounter on 04/04/24  Source Information  Simon Lavonia SAILOR, MD  [facility]      Media Information  Document Information  Photos    04/04/2024 19:19  Attached To:  Hospital Encounter on 04/04/24  Source Information  Simon Lavonia SAILOR, MD  [facility]     (all labs ordered are listed, but only abnormal results are displayed) Labs Reviewed  COMPREHENSIVE METABOLIC PANEL WITH GFR - Abnormal; Notable for the following components:      Result Value   Glucose, Bld 107 (*)    Total Protein 10.2 (*)    Albumin 3.3 (*)    All other components within normal limits  CBC WITH DIFFERENTIAL/PLATELET - Abnormal; Notable for the following components:   MCHC 29.9 (*)    RDW 16.8 (*)    Platelets 451 (*)    All other components within normal limits  URINALYSIS, W/ REFLEX TO CULTURE (INFECTION SUSPECTED) - Abnormal; Notable for the following components:   Color, Urine COLORLESS (*)    Specific Gravity, Urine 1.003 (*)    All other components within normal limits  I-STAT CG4 LACTIC ACID, ED - Abnormal; Notable for the following components:   Lactic Acid, Venous 3.1 (*)    All other components within normal limits  CULTURE, BLOOD (ROUTINE X 2)  CULTURE, BLOOD (ROUTINE X 2)  PROTIME-INR  BASIC METABOLIC PANEL WITH GFR  CBC  I-STAT CG4 LACTIC ACID, ED    EKG: None  Radiology: DG Tibia/Fibula Right Result Date: 04/04/2024 CLINICAL DATA:  Soft tissue wound, assess for osteomyelitis EXAM: RIGHT TIBIA AND FIBULA - 2 VIEW COMPARISON:  03/29/2024 FINDINGS: Frontal and lateral views of the right tibia and fibula are obtained. There is diffuse soft tissue swelling. No subcutaneous gas or radiopaque foreign body. There are no acute or destructive bony abnormalities. 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 IMPRESSION: 1. Stable diffuse soft tissue swelling. 2. No evidence of osteomyelitis. 3. Stable osteoarthritis. Electronically Signed   By:  Ozell Daring M.D.   On: 04/04/2024 17:59   DG Foot Complete Left Result Date: 04/04/2024 CLINICAL DATA:  Soft tissue wound, concern for osteomyelitis EXAM: DG FOOT COMPLETE 3+V*L* COMPARISON:  03/29/2024 FINDINGS: Frontal, oblique, and lateral views of the left foot are obtained. Stable periarticular osteopenia. There are no acute displaced fractures. Multifocal osteoarthritis greatest in the midfoot. There is diffuse soft tissue edema. No subcutaneous gas or radiopaque foreign body. No bony destruction or periosteal reaction to suggest acute osteomyelitis. IMPRESSION: 1. Diffuse soft tissue  swelling. No radiographic evidence of acute osteomyelitis. 2. Stable osteopenia and osteoarthritis. Electronically Signed   By: Ozell Daring M.D.   On: 04/04/2024 17:58   DG Foot Complete Right Result Date: 04/04/2024 CLINICAL DATA:  Wound, assess for osteomyelitis EXAM: RIGHT FOOT COMPLETE - 3+ VIEW COMPARISON:  03/29/2024 FINDINGS: Frontal, oblique, lateral views of the right foot are obtained. Continue periarticular osteopenia. There are no acute displaced fractures. Multifocal osteoarthritis greatest throughout the midfoot unchanged. There is diffuse soft tissue swelling. Suspected ulceration overlying the dorsal aspect of the calcaneus. No subcutaneous gas or radiopaque foreign body. There is no bony destruction or periosteal reaction to suggest osteomyelitis. IMPRESSION: 1. Continued diffuse soft tissue swelling of the right foot. No evidence of osteomyelitis. 2. Stable osteopenia and multifocal osteoarthritis. Electronically Signed   By: Ozell Daring M.D.   On: 04/04/2024 17:56     Procedures   Medications Ordered in the ED  acetaminophen  (TYLENOL ) tablet 650 mg (has no administration in time range)    Or  acetaminophen  (TYLENOL ) suppository 650 mg (has no administration in time range)  senna-docusate (Senokot-S) tablet 1 tablet (has no administration in time range)  bisacodyl  (DULCOLAX) EC tablet  5 mg (has no administration in time range)  ondansetron  (ZOFRAN ) tablet 4 mg (has no administration in time range)    Or  ondansetron  (ZOFRAN ) injection 4 mg (has no administration in time range)  enoxaparin  (LOVENOX ) injection 40 mg (has no administration in time range)  cefTRIAXone  (ROCEPHIN ) 1 g in sodium chloride  0.9 % 100 mL IVPB (1 g Intravenous New Bag/Given 04/04/24 2139)  acetaminophen  (TYLENOL ) tablet 1,000 mg (1,000 mg Oral Given 04/04/24 2139)                                    Medical Decision Making Risk OTC drugs. Decision regarding hospitalization.     HPI:    Presents because of increasing wound wounds of the lower extremities.  Patient states that she has a home health nurse come by once a week.  Concerned that the wounds are getting worse or sent to ED for further evaluation.  Dorsal pain to these lower wounds.  No chest pain or shortness of breath.  No prior chest pain or hemoptysis.  Denies a history of DVT or PE.  No obvious fevers at home.  Previous medical history reviewed : Patient was last seen in the ED on October 15 thousand 25.  Patient was discharged with PT and home health wound care arranged. MDM:    Upon exam, patient hemodynamic stable.  Afebrile.  Obtained laboratory workup to assess for any kind of leukocytosis or left shift or lactic acidosis that be concerning for possible for  cellulitis or other infection.  No abscess.  No evidence of crepitus on exam to be concern for any kind of gas-forming infection.  Patient has known history of venous stasis deficiency.  Likely causing the presentation.  No concerns regarding ischemic disease process at this time based off examination and history.  Reevaluation:   Initial lactic acid elevated above 3.  Obtain blood cultures.  Start patient ceftriaxone  to cover for any kind of strep or other bacterial infection though because any, bacteremia from the possible cellulitis.  No leukocytosis.  Did repeat lactic  acid without any kind of intervention such as fluid resuscitation.  Lactic acid subsequently resolved.  Unclear etiology or significance at this slightly elevated 1  time and lactic acidosis.  Will be placed in observation given the skin findings, lactic acidosis as well as increasing pain to the area.  Patient had started on ceftriaxone .  No concerns for osteomyelitis or other infection at the time  Interventions: Ceftriaxone    EKG Interpreted by Me: Sinus         Final diagnoses:  None    ED Discharge Orders     None          Simon Lavonia SAILOR, MD 04/04/24 2316

## 2024-04-05 ENCOUNTER — Observation Stay (HOSPITAL_COMMUNITY)

## 2024-04-05 DIAGNOSIS — S81802A Unspecified open wound, left lower leg, initial encounter: Secondary | ICD-10-CM

## 2024-04-05 DIAGNOSIS — I83003 Varicose veins of unspecified lower extremity with ulcer of ankle: Secondary | ICD-10-CM | POA: Diagnosis not present

## 2024-04-05 DIAGNOSIS — I872 Venous insufficiency (chronic) (peripheral): Secondary | ICD-10-CM | POA: Diagnosis not present

## 2024-04-05 DIAGNOSIS — S81801A Unspecified open wound, right lower leg, initial encounter: Secondary | ICD-10-CM | POA: Diagnosis not present

## 2024-04-05 DIAGNOSIS — L97302 Non-pressure chronic ulcer of unspecified ankle with fat layer exposed: Secondary | ICD-10-CM

## 2024-04-05 DIAGNOSIS — L97909 Non-pressure chronic ulcer of unspecified part of unspecified lower leg with unspecified severity: Secondary | ICD-10-CM | POA: Diagnosis not present

## 2024-04-05 LAB — CBC
HCT: 34.7 % — ABNORMAL LOW (ref 36.0–46.0)
Hemoglobin: 10.5 g/dL — ABNORMAL LOW (ref 12.0–15.0)
MCH: 26.4 pg (ref 26.0–34.0)
MCHC: 30.3 g/dL (ref 30.0–36.0)
MCV: 87.2 fL (ref 80.0–100.0)
Platelets: 349 K/uL (ref 150–400)
RBC: 3.98 MIL/uL (ref 3.87–5.11)
RDW: 17 % — ABNORMAL HIGH (ref 11.5–15.5)
WBC: 6.3 K/uL (ref 4.0–10.5)
nRBC: 0 % (ref 0.0–0.2)

## 2024-04-05 LAB — BASIC METABOLIC PANEL WITH GFR
Anion gap: 7 (ref 5–15)
BUN: 7 mg/dL — ABNORMAL LOW (ref 8–23)
CO2: 25 mmol/L (ref 22–32)
Calcium: 9.2 mg/dL (ref 8.9–10.3)
Chloride: 107 mmol/L (ref 98–111)
Creatinine, Ser: 0.77 mg/dL (ref 0.44–1.00)
GFR, Estimated: >60 mL/min (ref >60)
Glucose, Bld: 127 mg/dL — ABNORMAL HIGH (ref 70–99)
Potassium: 3.7 mmol/L (ref 3.5–5.1)
Sodium: 139 mmol/L (ref 135–145)

## 2024-04-05 LAB — CBG MONITORING, ED
Glucose-Capillary: 107 mg/dL — ABNORMAL HIGH (ref 70–99)
Glucose-Capillary: 148 mg/dL — ABNORMAL HIGH (ref 70–99)

## 2024-04-05 LAB — VAS US ABI WITH/WO TBI
Left ABI: 0.99
Right ABI: 0.99

## 2024-04-05 LAB — GLUCOSE, CAPILLARY
Glucose-Capillary: 137 mg/dL — ABNORMAL HIGH (ref 70–99)
Glucose-Capillary: 126 mg/dL — ABNORMAL HIGH (ref 70–99)

## 2024-04-05 MED ORDER — ATORVASTATIN CALCIUM 10 MG PO TABS
20.0000 mg | ORAL_TABLET | Freq: Every day | ORAL | Status: DC
  Administered 2024-04-05 – 2024-04-11 (×7): 20 mg via ORAL
  Filled 2024-04-05 (×7): qty 2

## 2024-04-05 MED ORDER — OXYCODONE HCL 5 MG PO TABS
5.0000 mg | ORAL_TABLET | Freq: Four times a day (QID) | ORAL | Status: DC | PRN
  Administered 2024-04-05: 5 mg via ORAL
  Filled 2024-04-05: qty 1

## 2024-04-05 MED ORDER — ATENOLOL 25 MG PO TABS
25.0000 mg | ORAL_TABLET | Freq: Every day | ORAL | Status: DC
  Administered 2024-04-05 – 2024-04-12 (×7): 25 mg via ORAL
  Filled 2024-04-05 (×8): qty 1

## 2024-04-05 MED ORDER — ALLOPURINOL 100 MG PO TABS
100.0000 mg | ORAL_TABLET | Freq: Every day | ORAL | Status: DC
  Administered 2024-04-05 – 2024-04-12 (×7): 100 mg via ORAL
  Filled 2024-04-05 (×7): qty 1

## 2024-04-05 MED ORDER — MIRTAZAPINE 15 MG PO TABS
7.5000 mg | ORAL_TABLET | Freq: Every day | ORAL | Status: DC
  Administered 2024-04-05 – 2024-04-11 (×7): 7.5 mg via ORAL
  Filled 2024-04-05 (×7): qty 1

## 2024-04-05 MED ORDER — NIFEDIPINE ER OSMOTIC RELEASE 60 MG PO TB24
90.0000 mg | ORAL_TABLET | Freq: Every day | ORAL | Status: DC
  Administered 2024-04-06 – 2024-04-12 (×6): 90 mg via ORAL
  Filled 2024-04-05 (×8): qty 1

## 2024-04-05 MED ORDER — ENSURE PLUS HIGH PROTEIN PO LIQD
237.0000 mL | Freq: Two times a day (BID) | ORAL | Status: DC
  Administered 2024-04-06 – 2024-04-12 (×9): 237 mL via ORAL

## 2024-04-05 MED ORDER — INSULIN ASPART 100 UNIT/ML IJ SOLN
0.0000 [IU] | Freq: Three times a day (TID) | INTRAMUSCULAR | Status: DC
  Administered 2024-04-05 – 2024-04-06 (×4): 2 [IU] via SUBCUTANEOUS
  Administered 2024-04-07: 3 [IU] via SUBCUTANEOUS
  Administered 2024-04-09: 2 [IU] via SUBCUTANEOUS
  Administered 2024-04-09 – 2024-04-11 (×5): 3 [IU] via SUBCUTANEOUS

## 2024-04-05 MED ORDER — COLCHICINE 0.6 MG PO TABS
0.6000 mg | ORAL_TABLET | Freq: Every day | ORAL | Status: DC
  Administered 2024-04-05 – 2024-04-12 (×7): 0.6 mg via ORAL
  Filled 2024-04-05 (×9): qty 1

## 2024-04-05 MED ORDER — ASPIRIN 81 MG PO TBEC
81.0000 mg | DELAYED_RELEASE_TABLET | Freq: Every day | ORAL | Status: DC
  Administered 2024-04-06 – 2024-04-12 (×6): 81 mg via ORAL
  Filled 2024-04-05 (×6): qty 1

## 2024-04-05 MED ORDER — GABAPENTIN 300 MG PO CAPS
300.0000 mg | ORAL_CAPSULE | Freq: Three times a day (TID) | ORAL | Status: DC
  Administered 2024-04-05 – 2024-04-12 (×21): 300 mg via ORAL
  Filled 2024-04-05: qty 3
  Filled 2024-04-05 (×7): qty 1
  Filled 2024-04-05: qty 3
  Filled 2024-04-05 (×12): qty 1

## 2024-04-05 MED ORDER — CYCLOBENZAPRINE HCL 5 MG PO TABS
5.0000 mg | ORAL_TABLET | Freq: Every day | ORAL | Status: DC
  Administered 2024-04-05 – 2024-04-12 (×7): 5 mg via ORAL
  Filled 2024-04-05 (×7): qty 1

## 2024-04-05 MED ORDER — ACETAMINOPHEN 500 MG PO TABS
1000.0000 mg | ORAL_TABLET | Freq: Three times a day (TID) | ORAL | Status: DC
  Administered 2024-04-05 – 2024-04-12 (×20): 1000 mg via ORAL
  Filled 2024-04-05 (×21): qty 2

## 2024-04-05 MED ORDER — SERTRALINE HCL 50 MG PO TABS
50.0000 mg | ORAL_TABLET | Freq: Every day | ORAL | Status: DC
  Administered 2024-04-05 – 2024-04-12 (×7): 50 mg via ORAL
  Filled 2024-04-05 (×7): qty 1

## 2024-04-05 NOTE — Progress Notes (Signed)
 Dressing change completed.

## 2024-04-05 NOTE — Progress Notes (Signed)
 PROGRESS NOTE    [name]  FMW:[id number] DOB: Aug 11, 1950 DOA: 04/04/2024 PCP: [facility] [address] [facility]   Brief Narrative:   [name] is a 73 y.o. female with medical history significant for insulin -dependent T2DM, HTN, HLD, CVA, seizure, bipolar affective disorder, lymphedema, bilateral chronic venous stasis ulcers who was sent by PCP for evaluation of her chronic wounds.  Patient reports her wounds were previously managed by orthopedic surgery but has not been able to see them in the last few months. She was previously getting home health RN visits 3 times a week but currently down to once a week.  She has attempted to manage her wounds at home without success.  She was seen by her PCP today and advised to present to the ED due to concern about her wounds getting worse.  Referred to the ER for further evaluation.  ED Course: Initial vitals show patient afebrile HR 80-1 tens, SBP 130-150s, SpO2 97% on room air. Initial labs significant for normal white count, normal renal function and LFTs, lactic acid 3.1->1.3 UA with no signs of infection. EKG shows show sinus tach.  Right foot, right tibia/fibular and left foot x-rays show diffuse soft tissue swelling but no evidence of osteomyelitis.  Pt received IV Rocephin . TRH was consulted for admission.    Assessment & Plan:   Principal Problem:   Venous stasis ulcer (HCC) Active Problems:   Anxiety and depression   Chronic venous insufficiency   Open wound of both lower extremities   Assessment/Plan [name] is a 73 y.o. female with medical history significant for mild dementia, insulin -dependent T2DM, HTN, HLD, CVA, seizure, bipolar affective disorder, lymphedema, bilateral chronic venous stasis ulcers who was sent by PCP for evaluation of her chronic wounds and admitted for chronic venous stasis ulcers.   # Chronic leg wounds # Chronic venous stasis ulcers - Patient with chronic leg wounds sent by PCP due to concern about  worsening wounds - On exam, patient has chronic venous stasis ulcers that has not healed appropriately - X-ray reveals soft tissue swelling but no osteomyelitis, no signs of cellulitis on exam - Will hold off on further antibiotics and consult wound care - ortho consult(Dr Harden aware) - Pain control with scheduled Flexeril , Tylenol  and as needed oxycodone  - ABI normal bilaterally.  # Chronic venous insufficiency # Lymphedema - On chart review, patient evaluated by vascular surgery 3 months ago and the duplex study showed reflux in the small saphenous vein at the STJ and proximal calf, - Patient was advised during this visit that venous ablation would not offer much benefit to her, she was advised to continue wound care, compression therapy and leg elevation - Follow-up with vascular surgery in the outpatient   # T2DM - Last A1c 6.4% 10 months ago, blood glucose stable at 107 on admission. -Looks like patient takes Tresiba  at home.  Will hold off on long-acting insulin  due to relatively low blood sugars. - SSI with meals, CBG monitoring - Follow-up repeat A1c   # HTN - BP slightly elevated with SBP in the 130s to 150s - Continue atenolol  and nifedipine    # Neuropathy - Continue gabapentin    # HLD - Continue atorvastatin    # Bipolar disorder # Anxiety and depression - Continue sertraline and mirtazapine    # Gout - Continue allopurinol  and colchicine    Per MAR review, patient does not remember taking Keppra , pentoxifylline  , Aricept   DVT prophylaxis: Lovenox       Code Status: Full  Code   Consults called: Dr. Harden   Family Communication: Discussed admission with husband at bedside   Severity of Illness: The appropriate patient status for this patient is OBSERVATION. Observation status is judged to be reasonable and necessary in order to provide the required intensity of service to ensure the patient's safety. The patient's presenting symptoms, physical exam findings, and  initial radiographic and laboratory data in the context of their medical condition is felt to place them at decreased risk for further clinical deterioration. Furthermore, it is anticipated that the patient will be medically stable for discharge from the hospital within 2 midnights of admission.    Subjective:  Patient seen and examined at the bedside.  She reports ongoing pain in her legs.  Denies fever or chills.  Vital signs are stable.  Objective: Vitals:   04/05/24 0500 04/05/24 0700 04/05/24 0730 04/05/24 1119  BP: 122/65 122/69 126/76 127/67  Pulse: 73 65 82 79  Resp:  17 (!) 21 19  Temp: 98.2 F (36.8 C)   97.6 F (36.4 C)  TempSrc:    Oral  SpO2: 100% 98% 98% 100%  Weight:      Height:       No intake or output data in the 24 hours ending 04/05/24 1449 Filed Weights   04/04/24 1926  Weight: 91.6 kg    Examination:  Physical Exam:  General: Pleasant, well-appearing elderly woman laying in bed. No acute distress. HEENT: [place]/AT. Anicteric sclera CV: RRR. No murmurs, rubs, or gallops. Pulmonary: Lungs CTAB. Normal effort. No wheezing or rales. Abdominal: Soft, nontender, nondistended. Normal bowel sounds. Extremities: Chronic venous insufficiency and lymphedema. 1+ edema Skin: Warm and dry. Chronic venous stasis changes with hyperpigmentation and edema to the RLE.  Large ~ 11 cm x 6 cm ulceration on anterior aspect of the ankle with granulation tissue and fibrinous exudative tissue with no surrounding erythema or drainage.  Large superficial ulcer on the dorsal aspect of the left foot. (See below)  Neuro: A&Ox3. Moves all extremities. Normal sensation to light touch. No focal deficit. Psych: Normal mood and affect    Data Reviewed: I have personally reviewed following labs and imaging studies  CBC: Recent Labs  Lab 03/29/24 1700 04/04/24 1639 04/05/24 0501  WBC 9.0 9.1 6.3  NEUTROABS 6.5 6.6  --   HGB 10.7* 12.9 10.5*  HCT 36.4 43.1 34.7*  MCV 86.3 87.8 87.2   PLT 401* 451* 349   Basic Metabolic Panel: Recent Labs  Lab 03/29/24 1700 04/04/24 1639 04/05/24 0501  NA 140 139 139  K 4.0 3.5 3.7  CL 105 103 107  CO2 25 22 25   GLUCOSE 164* 107* 127*  BUN 11 8 7*  CREATININE 0.81 0.89 0.77  CALCIUM  9.7 9.7 9.2   GFR: Estimated Creatinine Clearance: 67.7 mL/min (by C-G formula based on SCr of 0.77 mg/dL). Liver Function Tests: Recent Labs  Lab 03/29/24 1700 04/04/24 1639  AST 14* 17  ALT 6 13  ALKPHOS 113 113  BILITOT 0.5 0.5  PROT 8.8* 10.2*  ALBUMIN 3.4* 3.3*   No results for input(s): LIPASE, AMYLASE in the last 168 hours. No results for input(s): AMMONIA in the last 168 hours. Coagulation Profile: Recent Labs  Lab 04/04/24 1639  INR 1.1   Cardiac Enzymes: No results for input(s): CKTOTAL, CKMB, CKMBINDEX, TROPONINI in the last 168 hours. BNP (last 3 results) No results for input(s): PROBNP in the last 8760 hours. HbA1C: No results for input(s): HGBA1C in the last  72 hours. CBG: Recent Labs  Lab 04/05/24 0743 04/05/24 1316  GLUCAP 107* 148*   Lipid Profile: No results for input(s): CHOL, HDL, LDLCALC, TRIG, CHOLHDL, LDLDIRECT in the last 72 hours. Thyroid  Function Tests: No results for input(s): TSH, T4TOTAL, FREET4, T3FREE, THYROIDAB in the last 72 hours. Anemia Panel: No results for input(s): VITAMINB12, FOLATE, FERRITIN, TIBC, IRON, RETICCTPCT in the last 72 hours. Sepsis Labs: Recent Labs  Lab 04/04/24 1644 04/04/24 2004  LATICACIDVEN 3.1* 1.3    Recent Results (from the past 240 hours)  Culture, blood (Routine x 2)     Status: None (Preliminary result)   Collection Time: 04/04/24  4:38 PM   Specimen: BLOOD  Result Value Ref Range Status   Specimen Description BLOOD SITE NOT SPECIFIED  Final   Special Requests   Final    BOTTLES DRAWN AEROBIC AND ANAEROBIC Blood Culture results may not be optimal due to an inadequate volume of blood received in  culture bottles   Culture   Final    NO GROWTH < 12 HOURS Performed at [facility], [address] [place], [place] [address]    Report Status PENDING  Incomplete  Culture, blood (Routine x 2)     Status: None (Preliminary result)   Collection Time: 04/04/24  7:29 PM   Specimen: BLOOD RIGHT ARM  Result Value Ref Range Status   Specimen Description BLOOD RIGHT ARM  Final   Special Requests   Final    BOTTLES DRAWN AEROBIC AND ANAEROBIC Blood Culture results may not be optimal due to an inadequate volume of blood received in culture bottles   Culture   Final    NO GROWTH < 12 HOURS Performed at [facility], [address], [place], [place] [address]    Report Status PENDING  Incomplete         Radiology Studies: VAS US  ABI WITH/WO TBI Result Date: 04/05/2024  LOWER EXTREMITY DOPPLER STUDY Patient Name:  [name]  Date of Exam:   04/05/2024 Medical Rec #: [id number]        Accession #:    [id number] Date of Birth: 1951/04/20       Patient Gender: F Patient Age:   51 years Exam Location:  [facility] Procedure:      VAS US  ABI WITH/WO TBI Referring Phys: PROSPER AMPONSAH --------------------------------------------------------------------------------  Indications: Ulceration, and peripheral artery disease. Chronic venous stasis. High Risk Factors: Hypertension, hyperlipidemia, Diabetes, no history of                    smoking, prior CVA.  Limitations: Today's exam was limited due to Wrap around bandages. Comparison Study: Previous study on 5.9.2024. Performing Technologist: Edilia Elden Appl  Examination Guidelines: A complete evaluation includes at minimum, Doppler waveform signals and systolic blood pressure reading at the level of bilateral brachial, anterior tibial, and posterior tibial arteries, when vessel segments are accessible. Bilateral testing is considered an integral part of a complete examination. Photoelectric Plethysmograph (PPG) waveforms and  toe systolic pressure readings are included as required and additional duplex testing as needed. Limited examinations for reoccurring indications may be performed as noted.  ABI Findings: +---------+------------------+-----+---------+--------+ Right    Rt Pressure (mmHg)IndexWaveform Comment  +---------+------------------+-----+---------+--------+ Brachial 153                    triphasic         +---------+------------------+-----+---------+--------+ DP       156  0.99 biphasic          +---------+------------------+-----+---------+--------+ Great Toe115               0.73 Normal            +---------+------------------+-----+---------+--------+ +---------+------------------+-----+---------+-----------------+ Left     Lt Pressure (mmHg)IndexWaveform Comment           +---------+------------------+-----+---------+-----------------+ Brachial 157                    triphasic                  +---------+------------------+-----+---------+-----------------+ DP       156               0.99 biphasic                   +---------+------------------+-----+---------+-----------------+ Great Toe230               1.46 Normal   Falsely elevated. +---------+------------------+-----+---------+-----------------+ +-------+-----------+-----------+------------+------------+ ABI/TBIToday's ABIToday's TBIPrevious ABIPrevious TBI +-------+-----------+-----------+------------+------------+ Right  0.99       0.73                                +-------+-----------+-----------+------------+------------+ Left   0.99       1.46                                +-------+-----------+-----------+------------+------------+  Summary: Right: Resting right ankle-brachial index is within normal range. The right toe-brachial index is normal.  Left: Resting left ankle-brachial index is within normal range. The left toe-brachial index is abnormal.  *See table(s) above for  measurements and observations.  Electronically signed by Norman Serve on 04/05/2024 at 11:32:51 AM.    Final    DG Tibia/Fibula Right Result Date: 04/04/2024 CLINICAL DATA:  Soft tissue wound, assess for osteomyelitis EXAM: RIGHT TIBIA AND FIBULA - 2 VIEW COMPARISON:  03/29/2024 FINDINGS: Frontal and lateral views of the right tibia and fibula are obtained. There is diffuse soft tissue swelling. No subcutaneous gas or radiopaque foreign body. There are no acute or destructive bony abnormalities. 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 IMPRESSION: 1. Stable diffuse soft tissue swelling. 2. No evidence of osteomyelitis. 3. Stable osteoarthritis. Electronically Signed   By: Ozell Daring M.D.   On: 04/04/2024 17:59   DG Foot Complete Left Result Date: 04/04/2024 CLINICAL DATA:  Soft tissue wound, concern for osteomyelitis EXAM: DG FOOT COMPLETE 3+V*L* COMPARISON:  03/29/2024 FINDINGS: Frontal, oblique, and lateral views of the left foot are obtained. Stable periarticular osteopenia. There are no acute displaced fractures. Multifocal osteoarthritis greatest in the midfoot. There is diffuse soft tissue edema. No subcutaneous gas or radiopaque foreign body. No bony destruction or periosteal reaction to suggest acute osteomyelitis. IMPRESSION: 1. Diffuse soft tissue swelling. No radiographic evidence of acute osteomyelitis. 2. Stable osteopenia and osteoarthritis. Electronically Signed   By: Ozell Daring M.D.   On: 04/04/2024 17:58   DG Foot Complete Right Result Date: 04/04/2024 CLINICAL DATA:  Wound, assess for osteomyelitis EXAM: RIGHT FOOT COMPLETE - 3+ VIEW COMPARISON:  03/29/2024 FINDINGS: Frontal, oblique, lateral views of the right foot are obtained. Continue periarticular osteopenia. There are no acute displaced fractures. Multifocal osteoarthritis greatest throughout the midfoot unchanged. There is diffuse soft tissue swelling. Suspected ulceration overlying the dorsal aspect of the  calcaneus. No subcutaneous gas or radiopaque foreign  body. There is no bony destruction or periosteal reaction to suggest osteomyelitis. IMPRESSION: 1. Continued diffuse soft tissue swelling of the right foot. No evidence of osteomyelitis. 2. Stable osteopenia and multifocal osteoarthritis. Electronically Signed   By: Ozell Daring M.D.   On: 04/04/2024 17:56        Scheduled Meds:  acetaminophen   1,000 mg Oral TID   allopurinol   100 mg Oral Daily   atenolol   25 mg Oral Daily   atorvastatin   20 mg Oral QHS   colchicine   0.6 mg Oral Daily   cyclobenzaprine   5 mg Oral Daily   enoxaparin  (LOVENOX ) injection  40 mg Subcutaneous Q24H   gabapentin   300 mg Oral TID   insulin  aspart  0-15 Units Subcutaneous TID WC   mirtazapine   7.5 mg Oral QHS   NIFEdipine   90 mg Oral Daily   sertraline  50 mg Oral Daily   Continuous Infusions:        Derryl Duval, MD [facility] 04/05/2024, 2:49 PM

## 2024-04-05 NOTE — Consult Note (Signed)
 WOC Nurse Consult Note: patient is well known to [facility] team from previous admissions; has chronic lymphedema and venous insufficiency with longstanding venous ulcers; is followed by Dr. Harden and was last seen in his office 01/20/2024 with orders for Vashe WTD dressings  Reason for Consult: B lower leg wounds  Wound type: full thickness lower legs/ankle/dorsal feet  r/t venous insufficiency as above  Pressure Injury POA: NA  Measurement: see nursing flowsheet  Wound bed: mix of red moist and tan fibrinous tissue  Drainage (amount, consistency, odor) see nursing flowsheet; no mention of foul smelling or purulent drainage in notes  Periwound: chronic changes of venous stasis with dark pigmentation  Dressing procedure/placement/frequency: Cleanse B lower leg/ankle/foot wounds with Vashe wound cleanser Soila [phone]), do not rinse and allow to air dry.  Apply Vashe moistened gauze to wound beds daily, cover with ABD pads and secure with Kerlix roll gauze beginning right above toes and ending right below knees.  Apply Ace wrap in same fashion as Kerlix for light compression.  Patient should elevate legs as much as possible.   Per ortho notes patient has not tolerated compression in the past.  Dr. Harden has been consulted on this patient, any wound care orders placed by Dr. Harden supercede wound care orders placed by [facility] RN.   WOC team will not follow. Re-consult if further needs arise.   Thank you,    Powell Bar MSN, RN-BC, [employer]

## 2024-04-05 NOTE — H&P (View-Only) (Signed)
 ORTHOPAEDIC CONSULTATION  REQUESTING PHYSICIAN: Mcarthur Pick, MD  Chief Complaint: Chronic venous insufficiency ulcers bilateral lower extremities.  HPI: [name] is a 73 y.o. female who presents with chronic venous ulcers anterior aspect of both ankles.  Patient is status post debridement of both lower extremities a year ago.  Past Medical History:  Diagnosis Date   Anxiety    Arthritis    back, arms, legs (03/24/2016)   Asthma    Chronic lower back pain    Colonic polyp    last colonoscopy done in 2009 with normal results per medical record   Depressive disorder    Gastric polyp    Gout    has taken allopurinol  300mg  once daily in past   Headache    History of hiatal hernia    Hypertension    Migraine    none in awhile; might have a couple/year (03/24/2016)   Mixed hyperlipidemia    01/2016 Total chol 141, HDL 59, LDL 63, ration 1.1   Osteoarthritis    TIA (transient ischemic attack) 11/2014   Type II diabetes mellitus (HCC)    Past Surgical History:  Procedure Laterality Date   ARTERY BIOPSY Right 08/12/2016   Procedure: BIOPSY TEMPORAL ARTERY;  Surgeon: Carlin FORBES Haddock, MD;  Location: [facility] OR;  Service: Vascular;  Laterality: Right;  BIOPSY TEMPORAL ARTERY   BREAST BIOPSY Left ~ 2015   benign   CARPAL TUNNEL RELEASE Bilateral    DILATION AND CURETTAGE OF UTERUS     I & D EXTREMITY Bilateral 10/24/2022   Procedure: DEBRIDEMENT LEFT LEG AND RIGHT ANKLE;  Surgeon: Harden Jerona GAILS, MD;  Location: [facility] OR;  Service: Orthopedics;  Laterality: Bilateral;   IR CT HEAD LTD  06/11/2022   IR PERCUTANEOUS ART THROMBECTOMY/INFUSION INTRACRANIAL INC DIAG ANGIO  06/11/2022   IR US  GUIDE VASC ACCESS RIGHT  06/11/2022   KNEE ARTHROSCOPY Right 2003   in [facility]   LAPAROSCOPIC CHOLECYSTECTOMY     LOOP RECORDER INSERTION N/A 06/26/2022   Procedure: LOOP RECORDER INSERTION;  Surgeon: Inocencio Soyla Lunger, MD;  Location: [facility] INVASIVE CV LAB;  Service: Cardiovascular;  Laterality:  N/A;   RADIOLOGY WITH ANESTHESIA N/A 06/11/2022   Procedure: IR WITH ANESTHESIA;  Surgeon: Radiologist, Medication, MD;  Location: [facility] OR;  Service: Radiology;  Laterality: N/A;   TUBAL LIGATION     VAGINAL HYSTERECTOMY  1982   Social History   Socioeconomic History   Marital status: Married    Spouse name: Not on file   Number of children: Not on file   Years of education: 12+   Highest education level: Not on file  Occupational History   Not on file  Tobacco Use   Smoking status: Never   Smokeless tobacco: Never  Vaping Use   Vaping status: Never Used  Substance and Sexual Activity   Alcohol use: No   Drug use: No   Sexual activity: Never  Other Topics Concern   Not on file  Social History Narrative   Right handed   Caffeine use: none   Lives with husband   Recently moved from     Social Drivers of Health   Financial Resource Strain: Not on file  Food Insecurity: No Food Insecurity (04/05/2024)   Hunger Vital Sign    Worried About Running Out of Food in the Last Year: Never true    Ran Out of Food in the Last Year: Never true  Transportation Needs: No Transportation Needs (04/05/2024)  PRAPARE - Administrator, Civil Service (Medical): No    Lack of Transportation (Non-Medical): No  Physical Activity: Not on file  Stress: Not on file  Social Connections: Moderately Isolated (04/05/2024)   Social Connection and Isolation Panel    Frequency of Communication with Friends and Family: Twice a week    Frequency of Social Gatherings with Friends and Family: Once a week    Attends Religious Services: Never    Database administrator or Organizations: No    Attends Engineer, structural: Never    Marital Status: Married   Family History  Problem Relation Age of Onset   Stroke Father    Stroke Brother    Cancer Other    Gout Mother    Hypertension Mother    Arthritis Mother    Asthma Mother    Stroke Brother    - negative except  otherwise stated in the family history section Allergies  Allergen Reactions   Ace Inhibitors Swelling   Latex Itching and Swelling   Ultram [Tramadol] Nausea And Vomiting   Zanaflex [Tizanidine] Other (See Comments)    Tremors    Diprivan  [Propofol ] Itching   Metformin And Related Other (See Comments)    Tremors  Chills   Prior to Admission medications   Medication Sig Start Date End Date Taking? Authorizing Provider  aspirin  EC 81 MG tablet Take 81 mg by mouth daily. Swallow whole.   Yes [provider]  atenolol  (TENORMIN ) 25 MG tablet Take by mouth daily.   Yes [provider]  atorvastatin  (LIPITOR) 20 MG tablet Take 1 tablet (20 mg total) by mouth at bedtime. 06/11/23 04/05/25 Yes Cindy Garnette POUR, MD  colchicine  0.6 MG tablet Take 0.6 mg by mouth daily. 03/22/24  Yes [provider]  cyclobenzaprine  (FLEXERIL ) 5 MG tablet Take 5 mg by mouth daily as needed for muscle spasms.   Yes [provider]  gabapentin  (NEURONTIN ) 300 MG capsule Take 300 mg by mouth daily.   Yes [provider]  NIFEdipine  (ADALAT  CC) 90 MG 24 hr tablet Take 90 mg by mouth daily. 02/23/24  Yes [provider]  allopurinol  (ZYLOPRIM ) 100 MG tablet Take 100 mg by mouth daily.    [provider]  cephALEXin  (KEFLEX ) 500 MG capsule Take 500 mg by mouth 4 (four) times daily.    [provider]  donepezil  (ARICEPT ) 10 MG tablet Take 10 mg by mouth at bedtime.    [provider]  HYDROcodone -acetaminophen  (NORCO) 10-325 MG tablet Take 1 tablet by mouth 2 (two) times daily as needed. 03/22/24   [provider]  levETIRAcetam  (KEPPRA ) 750 MG tablet Take 1 tablet (750 mg total) by mouth 2 (two) times daily. 12/17/23   Sater, Charlie LABOR, MD  mirtazapine  (REMERON ) 7.5 MG tablet Take 7.5 mg by mouth at bedtime. Patient not taking: Reported on 04/05/2024    [provider]  pentoxifylline  (TRENTAL ) 400 MG CR tablet Take 1 tablet  (400 mg total) by mouth 3 (three) times daily with meals. 11/04/23   Valdemar Rocky SAUNDERS, NP  sertraline (ZOLOFT) 50 MG tablet Take 50 mg by mouth daily. 03/22/24   [provider]  SYMBICORT 80-4.5 MCG/ACT inhaler Inhale 2 puffs into the lungs. 01/11/24   [provider]  TRESIBA  FLEXTOUCH 200 UNIT/ML FlexTouch Pen Inject 30 Units into the skin 2 (two) times daily. 01/12/24   [provider]   VAS US  ABI WITH/WO TBI Result Date:  04/05/2024  LOWER EXTREMITY DOPPLER STUDY Patient Name:  [name]  Date of Exam:   04/05/2024 Medical Rec #: [id number]        Accession #:    [id number] Date of Birth: 1950/09/18       Patient Gender: F Patient Age:   76 years Exam Location:  [facility] Procedure:      VAS US  ABI WITH/WO TBI Referring Phys: PROSPER AMPONSAH --------------------------------------------------------------------------------  Indications: Ulceration, and peripheral artery disease. Chronic venous stasis. High Risk Factors: Hypertension, hyperlipidemia, Diabetes, no history of                    smoking, prior CVA.  Limitations: Today's exam was limited due to Wrap around bandages. Comparison Study: Previous study on 5.9.2024. Performing Technologist: Edilia Elden Appl  Examination Guidelines: A complete evaluation includes at minimum, Doppler waveform signals and systolic blood pressure reading at the level of bilateral brachial, anterior tibial, and posterior tibial arteries, when vessel segments are accessible. Bilateral testing is considered an integral part of a complete examination. Photoelectric Plethysmograph (PPG) waveforms and toe systolic pressure readings are included as required and additional duplex testing as needed. Limited examinations for reoccurring indications may be performed as noted.  ABI Findings: +---------+------------------+-----+---------+--------+ Right    Rt Pressure (mmHg)IndexWaveform Comment   +---------+------------------+-----+---------+--------+ Brachial 153                    triphasic         +---------+------------------+-----+---------+--------+ DP       156               0.99 biphasic          +---------+------------------+-----+---------+--------+ Great Toe115               0.73 Normal            +---------+------------------+-----+---------+--------+ +---------+------------------+-----+---------+-----------------+ Left     Lt Pressure (mmHg)IndexWaveform Comment           +---------+------------------+-----+---------+-----------------+ Brachial 157                    triphasic                  +---------+------------------+-----+---------+-----------------+ DP       156               0.99 biphasic                   +---------+------------------+-----+---------+-----------------+ Great Toe230               1.46 Normal   Falsely elevated. +---------+------------------+-----+---------+-----------------+ +-------+-----------+-----------+------------+------------+ ABI/TBIToday's ABIToday's TBIPrevious ABIPrevious TBI +-------+-----------+-----------+------------+------------+ Right  0.99       0.73                                +-------+-----------+-----------+------------+------------+ Left   0.99       1.46                                +-------+-----------+-----------+------------+------------+  Summary: Right: Resting right ankle-brachial index is within normal range. The right toe-brachial index is normal.  Left: Resting left ankle-brachial index is within normal range. The left toe-brachial index is abnormal.  *See table(s) above for measurements and observations.  Electronically signed by Norman Serve on 04/05/2024 at 11:32:51 AM.    Final  DG Tibia/Fibula Right Result Date: 04/04/2024 CLINICAL DATA:  Soft tissue wound, assess for osteomyelitis EXAM: RIGHT TIBIA AND FIBULA - 2 VIEW COMPARISON:  03/29/2024 FINDINGS: Frontal  and lateral views of the right tibia and fibula are obtained. There is diffuse soft tissue swelling. No subcutaneous gas or radiopaque foreign body. There are no acute or destructive bony abnormalities. 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 IMPRESSION: 1. Stable diffuse soft tissue swelling. 2. No evidence of osteomyelitis. 3. Stable osteoarthritis. Electronically Signed   By: Ozell Daring M.D.   On: 04/04/2024 17:59   DG Foot Complete Left Result Date: 04/04/2024 CLINICAL DATA:  Soft tissue wound, concern for osteomyelitis EXAM: DG FOOT COMPLETE 3+V*L* COMPARISON:  03/29/2024 FINDINGS: Frontal, oblique, and lateral views of the left foot are obtained. Stable periarticular osteopenia. There are no acute displaced fractures. Multifocal osteoarthritis greatest in the midfoot. There is diffuse soft tissue edema. No subcutaneous gas or radiopaque foreign body. No bony destruction or periosteal reaction to suggest acute osteomyelitis. IMPRESSION: 1. Diffuse soft tissue swelling. No radiographic evidence of acute osteomyelitis. 2. Stable osteopenia and osteoarthritis. Electronically Signed   By: Ozell Daring M.D.   On: 04/04/2024 17:58   DG Foot Complete Right Result Date: 04/04/2024 CLINICAL DATA:  Wound, assess for osteomyelitis EXAM: RIGHT FOOT COMPLETE - 3+ VIEW COMPARISON:  03/29/2024 FINDINGS: Frontal, oblique, lateral views of the right foot are obtained. Continue periarticular osteopenia. There are no acute displaced fractures. Multifocal osteoarthritis greatest throughout the midfoot unchanged. There is diffuse soft tissue swelling. Suspected ulceration overlying the dorsal aspect of the calcaneus. No subcutaneous gas or radiopaque foreign body. There is no bony destruction or periosteal reaction to suggest osteomyelitis. IMPRESSION: 1. Continued diffuse soft tissue swelling of the right foot. No evidence of osteomyelitis. 2. Stable osteopenia and multifocal osteoarthritis. Electronically  Signed   By: Ozell Daring M.D.   On: 04/04/2024 17:56   - pertinent xrays, CT, MRI studies were reviewed and independently interpreted  Positive ROS: All other systems have been reviewed and were otherwise negative with the exception of those mentioned in the HPI and as above.  Physical Exam: General: Alert, no acute distress Psychiatric: Patient is competent for consent with normal mood and affect Lymphatic: No axillary or cervical lymphadenopathy Cardiovascular: No pedal edema Respiratory: No cyanosis, no use of accessory musculature GI: No organomegaly, abdomen is soft and non-tender    Images:  @ENCIMAGES @  Labs:  Lab Results  Component Value Date   HGBA1C 6.4 (H) 06/09/2023   HGBA1C 8.1 (H) 10/23/2022   HGBA1C 9.5 (H) 06/11/2022   ESRSEDRATE 110 (H) 05/20/2023   ESRSEDRATE 90 (H) 10/23/2022   ESRSEDRATE 117 (H) 03/06/2021   CRP 4.7 (H) 05/20/2023   CRP 3.8 (H) 10/23/2022   CRP 43.7 (H) 03/06/2021   LABURIC 7.7 (H) 05/20/2023   REPTSTATUS PENDING 04/04/2024   GRAMSTAIN  10/24/2022    ABUNDANT SQUAMOUS EPITHELIAL CELLS PRESENT FEW WBC PRESENT, PREDOMINANTLY PMN MODERATE GRAM POSITIVE RODS MODERATE GRAM POSITIVE COCCI FEW GRAM NEGATIVE RODS    CULT  04/04/2024    NO GROWTH < 12 HOURS Performed at [facility], [address], [place], [place] [address]    LABORGA PROTEUS MIRABILIS 10/24/2022   LABORGA STAPHYLOCOCCUS AUREUS 10/24/2022   LABORGA PSEUDOMONAS AERUGINOSA 10/24/2022    Lab Results  Component Value Date   ALBUMIN 3.3 (L) 04/04/2024   ALBUMIN 3.4 (L) 03/29/2024   ALBUMIN 2.5 (L) 03/05/2024   PREALBUMIN 12.5 (L) 03/16/2017  LABURIC 7.7 (H) 05/20/2023        Latest Ref Rng & Units 04/05/2024    5:01 AM 04/04/2024    4:39 PM 03/29/2024    5:00 PM  CBC EXTENDED  WBC 4.0 - 10.5 K/uL 6.3  9.1  9.0   RBC 3.87 - 5.11 MIL/uL 3.98  4.91  4.22   Hemoglobin 12.0 - 15.0 g/dL 89.4  87.0  89.2   HCT 36.0 - 46.0 % 34.7  43.1  36.4   Platelets  150 - 400 K/uL 349  451  401   NEUT# 1.7 - 7.7 K/uL  6.6  6.5   Lymph# 0.7 - 4.0 K/uL  1.8  1.7     Neurologic: Patient does not have protective sensation bilateral lower extremities.   MUSCULOSKELETAL:   Skin: Examination patient has brawny skin color changes with papillomata's changes of both lower extremities.  She has a palpable dorsalis pedis pulse bilaterally.  There is healthy granulation tissue with extensive ulceration anteriorly over the ankles bilaterally.  Most recent hemoglobin A1c is 6.4.  No ascending cellulitis.  There is healthy granulation tissue in the wound beds there is no tunneling.  Assessment: Assessment: Bilateral lower extremity venous and lymphatic ulcers anterior aspect of the ankle.  Plan: Plan: Will plan for surgical debridement and application of Kerecis tissue graft with wound vacs on Friday.  Risks and benefits were discussed patient states she understands wished to proceed at this time.  Thank you for the consult and the opportunity to see Ms. Remi Jerona Sage, MD [facility] [phone] 6:21 PM

## 2024-04-05 NOTE — Consult Note (Signed)
 ORTHOPAEDIC CONSULTATION  REQUESTING PHYSICIAN: Mcarthur Pick, MD  Chief Complaint: Chronic venous insufficiency ulcers bilateral lower extremities.  HPI: [name] is a 73 y.o. female who presents with chronic venous ulcers anterior aspect of both ankles.  Patient is status post debridement of both lower extremities a year ago.  Past Medical History:  Diagnosis Date   Anxiety    Arthritis    back, arms, legs (03/24/2016)   Asthma    Chronic lower back pain    Colonic polyp    last colonoscopy done in 2009 with normal results per medical record   Depressive disorder    Gastric polyp    Gout    has taken allopurinol  300mg  once daily in past   Headache    History of hiatal hernia    Hypertension    Migraine    none in awhile; might have a couple/year (03/24/2016)   Mixed hyperlipidemia    01/2016 Total chol 141, HDL 59, LDL 63, ration 1.1   Osteoarthritis    TIA (transient ischemic attack) 11/2014   Type II diabetes mellitus (HCC)    Past Surgical History:  Procedure Laterality Date   ARTERY BIOPSY Right 08/12/2016   Procedure: BIOPSY TEMPORAL ARTERY;  Surgeon: Carlin FORBES Haddock, MD;  Location: [facility] OR;  Service: Vascular;  Laterality: Right;  BIOPSY TEMPORAL ARTERY   BREAST BIOPSY Left ~ 2015   benign   CARPAL TUNNEL RELEASE Bilateral    DILATION AND CURETTAGE OF UTERUS     I & D EXTREMITY Bilateral 10/24/2022   Procedure: DEBRIDEMENT LEFT LEG AND RIGHT ANKLE;  Surgeon: Harden Jerona GAILS, MD;  Location: [facility] OR;  Service: Orthopedics;  Laterality: Bilateral;   IR CT HEAD LTD  06/11/2022   IR PERCUTANEOUS ART THROMBECTOMY/INFUSION INTRACRANIAL INC DIAG ANGIO  06/11/2022   IR US  GUIDE VASC ACCESS RIGHT  06/11/2022   KNEE ARTHROSCOPY Right 2003   in [facility]   LAPAROSCOPIC CHOLECYSTECTOMY     LOOP RECORDER INSERTION N/A 06/26/2022   Procedure: LOOP RECORDER INSERTION;  Surgeon: Inocencio Soyla Lunger, MD;  Location: [facility] INVASIVE CV LAB;  Service: Cardiovascular;  Laterality:  N/A;   RADIOLOGY WITH ANESTHESIA N/A 06/11/2022   Procedure: IR WITH ANESTHESIA;  Surgeon: Radiologist, Medication, MD;  Location: [facility] OR;  Service: Radiology;  Laterality: N/A;   TUBAL LIGATION     VAGINAL HYSTERECTOMY  1982   Social History   Socioeconomic History   Marital status: Married    Spouse name: Not on file   Number of children: Not on file   Years of education: 12+   Highest education level: Not on file  Occupational History   Not on file  Tobacco Use   Smoking status: Never   Smokeless tobacco: Never  Vaping Use   Vaping status: Never Used  Substance and Sexual Activity   Alcohol use: No   Drug use: No   Sexual activity: Never  Other Topics Concern   Not on file  Social History Narrative   Right handed   Caffeine use: none   Lives with husband   Recently moved from     Social Drivers of Health   Financial Resource Strain: Not on file  Food Insecurity: No Food Insecurity (04/05/2024)   Hunger Vital Sign    Worried About Running Out of Food in the Last Year: Never true    Ran Out of Food in the Last Year: Never true  Transportation Needs: No Transportation Needs (04/05/2024)  PRAPARE - Administrator, Civil Service (Medical): No    Lack of Transportation (Non-Medical): No  Physical Activity: Not on file  Stress: Not on file  Social Connections: Moderately Isolated (04/05/2024)   Social Connection and Isolation Panel    Frequency of Communication with Friends and Family: Twice a week    Frequency of Social Gatherings with Friends and Family: Once a week    Attends Religious Services: Never    Database administrator or Organizations: No    Attends Engineer, structural: Never    Marital Status: Married   Family History  Problem Relation Age of Onset   Stroke Father    Stroke Brother    Cancer Other    Gout Mother    Hypertension Mother    Arthritis Mother    Asthma Mother    Stroke Brother    - negative except  otherwise stated in the family history section Allergies  Allergen Reactions   Ace Inhibitors Swelling   Latex Itching and Swelling   Ultram [Tramadol] Nausea And Vomiting   Zanaflex [Tizanidine] Other (See Comments)    Tremors    Diprivan  [Propofol ] Itching   Metformin And Related Other (See Comments)    Tremors  Chills   Prior to Admission medications   Medication Sig Start Date End Date Taking? Authorizing Provider  aspirin  EC 81 MG tablet Take 81 mg by mouth daily. Swallow whole.   Yes [provider]  atenolol  (TENORMIN ) 25 MG tablet Take by mouth daily.   Yes [provider]  atorvastatin  (LIPITOR) 20 MG tablet Take 1 tablet (20 mg total) by mouth at bedtime. 06/11/23 04/05/25 Yes Cindy Garnette POUR, MD  colchicine  0.6 MG tablet Take 0.6 mg by mouth daily. 03/22/24  Yes [provider]  cyclobenzaprine  (FLEXERIL ) 5 MG tablet Take 5 mg by mouth daily as needed for muscle spasms.   Yes [provider]  gabapentin  (NEURONTIN ) 300 MG capsule Take 300 mg by mouth daily.   Yes [provider]  NIFEdipine  (ADALAT  CC) 90 MG 24 hr tablet Take 90 mg by mouth daily. 02/23/24  Yes [provider]  allopurinol  (ZYLOPRIM ) 100 MG tablet Take 100 mg by mouth daily.    [provider]  cephALEXin  (KEFLEX ) 500 MG capsule Take 500 mg by mouth 4 (four) times daily.    [provider]  donepezil  (ARICEPT ) 10 MG tablet Take 10 mg by mouth at bedtime.    [provider]  HYDROcodone -acetaminophen  (NORCO) 10-325 MG tablet Take 1 tablet by mouth 2 (two) times daily as needed. 03/22/24   [provider]  levETIRAcetam  (KEPPRA ) 750 MG tablet Take 1 tablet (750 mg total) by mouth 2 (two) times daily. 12/17/23   Sater, Charlie LABOR, MD  mirtazapine  (REMERON ) 7.5 MG tablet Take 7.5 mg by mouth at bedtime. Patient not taking: Reported on 04/05/2024    [provider]  pentoxifylline  (TRENTAL ) 400 MG CR tablet Take 1 tablet  (400 mg total) by mouth 3 (three) times daily with meals. 11/04/23   Valdemar Rocky SAUNDERS, NP  sertraline (ZOLOFT) 50 MG tablet Take 50 mg by mouth daily. 03/22/24   [provider]  SYMBICORT 80-4.5 MCG/ACT inhaler Inhale 2 puffs into the lungs. 01/11/24   [provider]  TRESIBA  FLEXTOUCH 200 UNIT/ML FlexTouch Pen Inject 30 Units into the skin 2 (two) times daily. 01/12/24   [provider]   VAS US  ABI WITH/WO TBI Result Date:  04/05/2024  LOWER EXTREMITY DOPPLER STUDY Patient Name:  ALAYA IVERSON  Date of Exam:   04/05/2024 Medical Rec #: [id number]        Accession #:    [id number] Date of Birth: 1950/09/18       Patient Gender: F Patient Age:   76 years Exam Location:  [facility] Procedure:      VAS US  ABI WITH/WO TBI Referring Phys: PROSPER AMPONSAH --------------------------------------------------------------------------------  Indications: Ulceration, and peripheral artery disease. Chronic venous stasis. High Risk Factors: Hypertension, hyperlipidemia, Diabetes, no history of                    smoking, prior CVA.  Limitations: Today's exam was limited due to Wrap around bandages. Comparison Study: Previous study on 5.9.2024. Performing Technologist: Edilia Elden Appl  Examination Guidelines: A complete evaluation includes at minimum, Doppler waveform signals and systolic blood pressure reading at the level of bilateral brachial, anterior tibial, and posterior tibial arteries, when vessel segments are accessible. Bilateral testing is considered an integral part of a complete examination. Photoelectric Plethysmograph (PPG) waveforms and toe systolic pressure readings are included as required and additional duplex testing as needed. Limited examinations for reoccurring indications may be performed as noted.  ABI Findings: +---------+------------------+-----+---------+--------+ Right    Rt Pressure (mmHg)IndexWaveform Comment   +---------+------------------+-----+---------+--------+ Brachial 153                    triphasic         +---------+------------------+-----+---------+--------+ DP       156               0.99 biphasic          +---------+------------------+-----+---------+--------+ Great Toe115               0.73 Normal            +---------+------------------+-----+---------+--------+ +---------+------------------+-----+---------+-----------------+ Left     Lt Pressure (mmHg)IndexWaveform Comment           +---------+------------------+-----+---------+-----------------+ Brachial 157                    triphasic                  +---------+------------------+-----+---------+-----------------+ DP       156               0.99 biphasic                   +---------+------------------+-----+---------+-----------------+ Great Toe230               1.46 Normal   Falsely elevated. +---------+------------------+-----+---------+-----------------+ +-------+-----------+-----------+------------+------------+ ABI/TBIToday's ABIToday's TBIPrevious ABIPrevious TBI +-------+-----------+-----------+------------+------------+ Right  0.99       0.73                                +-------+-----------+-----------+------------+------------+ Left   0.99       1.46                                +-------+-----------+-----------+------------+------------+  Summary: Right: Resting right ankle-brachial index is within normal range. The right toe-brachial index is normal.  Left: Resting left ankle-brachial index is within normal range. The left toe-brachial index is abnormal.  *See table(s) above for measurements and observations.  Electronically signed by Norman Serve on 04/05/2024 at 11:32:51 AM.    Final  DG Tibia/Fibula Right Result Date: 04/04/2024 CLINICAL DATA:  Soft tissue wound, assess for osteomyelitis EXAM: RIGHT TIBIA AND FIBULA - 2 VIEW COMPARISON:  03/29/2024 FINDINGS: Frontal  and lateral views of the right tibia and fibula are obtained. There is diffuse soft tissue swelling. No subcutaneous gas or radiopaque foreign body. There are no acute or destructive bony abnormalities. 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 IMPRESSION: 1. Stable diffuse soft tissue swelling. 2. No evidence of osteomyelitis. 3. Stable osteoarthritis. Electronically Signed   By: Ozell Daring M.D.   On: 04/04/2024 17:59   DG Foot Complete Left Result Date: 04/04/2024 CLINICAL DATA:  Soft tissue wound, concern for osteomyelitis EXAM: DG FOOT COMPLETE 3+V*L* COMPARISON:  03/29/2024 FINDINGS: Frontal, oblique, and lateral views of the left foot are obtained. Stable periarticular osteopenia. There are no acute displaced fractures. Multifocal osteoarthritis greatest in the midfoot. There is diffuse soft tissue edema. No subcutaneous gas or radiopaque foreign body. No bony destruction or periosteal reaction to suggest acute osteomyelitis. IMPRESSION: 1. Diffuse soft tissue swelling. No radiographic evidence of acute osteomyelitis. 2. Stable osteopenia and osteoarthritis. Electronically Signed   By: Ozell Daring M.D.   On: 04/04/2024 17:58   DG Foot Complete Right Result Date: 04/04/2024 CLINICAL DATA:  Wound, assess for osteomyelitis EXAM: RIGHT FOOT COMPLETE - 3+ VIEW COMPARISON:  03/29/2024 FINDINGS: Frontal, oblique, lateral views of the right foot are obtained. Continue periarticular osteopenia. There are no acute displaced fractures. Multifocal osteoarthritis greatest throughout the midfoot unchanged. There is diffuse soft tissue swelling. Suspected ulceration overlying the dorsal aspect of the calcaneus. No subcutaneous gas or radiopaque foreign body. There is no bony destruction or periosteal reaction to suggest osteomyelitis. IMPRESSION: 1. Continued diffuse soft tissue swelling of the right foot. No evidence of osteomyelitis. 2. Stable osteopenia and multifocal osteoarthritis. Electronically  Signed   By: Ozell Daring M.D.   On: 04/04/2024 17:56   - pertinent xrays, CT, MRI studies were reviewed and independently interpreted  Positive ROS: All other systems have been reviewed and were otherwise negative with the exception of those mentioned in the HPI and as above.  Physical Exam: General: Alert, no acute distress Psychiatric: Patient is competent for consent with normal mood and affect Lymphatic: No axillary or cervical lymphadenopathy Cardiovascular: No pedal edema Respiratory: No cyanosis, no use of accessory musculature GI: No organomegaly, abdomen is soft and non-tender    Images:  @ENCIMAGES @  Labs:  Lab Results  Component Value Date   HGBA1C 6.4 (H) 06/09/2023   HGBA1C 8.1 (H) 10/23/2022   HGBA1C 9.5 (H) 06/11/2022   ESRSEDRATE 110 (H) 05/20/2023   ESRSEDRATE 90 (H) 10/23/2022   ESRSEDRATE 117 (H) 03/06/2021   CRP 4.7 (H) 05/20/2023   CRP 3.8 (H) 10/23/2022   CRP 43.7 (H) 03/06/2021   LABURIC 7.7 (H) 05/20/2023   REPTSTATUS PENDING 04/04/2024   GRAMSTAIN  10/24/2022    ABUNDANT SQUAMOUS EPITHELIAL CELLS PRESENT FEW WBC PRESENT, PREDOMINANTLY PMN MODERATE GRAM POSITIVE RODS MODERATE GRAM POSITIVE COCCI FEW GRAM NEGATIVE RODS    CULT  04/04/2024    NO GROWTH < 12 HOURS Performed at [facility], [address], [place], [place] [address]    LABORGA PROTEUS MIRABILIS 10/24/2022   LABORGA STAPHYLOCOCCUS AUREUS 10/24/2022   LABORGA PSEUDOMONAS AERUGINOSA 10/24/2022    Lab Results  Component Value Date   ALBUMIN 3.3 (L) 04/04/2024   ALBUMIN 3.4 (L) 03/29/2024   ALBUMIN 2.5 (L) 03/05/2024   PREALBUMIN 12.5 (L) 03/16/2017  LABURIC 7.7 (H) 05/20/2023        Latest Ref Rng & Units 04/05/2024    5:01 AM 04/04/2024    4:39 PM 03/29/2024    5:00 PM  CBC EXTENDED  WBC 4.0 - 10.5 K/uL 6.3  9.1  9.0   RBC 3.87 - 5.11 MIL/uL 3.98  4.91  4.22   Hemoglobin 12.0 - 15.0 g/dL 89.4  87.0  89.2   HCT 36.0 - 46.0 % 34.7  43.1  36.4   Platelets  150 - 400 K/uL 349  451  401   NEUT# 1.7 - 7.7 K/uL  6.6  6.5   Lymph# 0.7 - 4.0 K/uL  1.8  1.7     Neurologic: Patient does not have protective sensation bilateral lower extremities.   MUSCULOSKELETAL:   Skin: Examination patient has brawny skin color changes with papillomata's changes of both lower extremities.  She has a palpable dorsalis pedis pulse bilaterally.  There is healthy granulation tissue with extensive ulceration anteriorly over the ankles bilaterally.  Most recent hemoglobin A1c is 6.4.  No ascending cellulitis.  There is healthy granulation tissue in the wound beds there is no tunneling.  Assessment: Assessment: Bilateral lower extremity venous and lymphatic ulcers anterior aspect of the ankle.  Plan: Plan: Will plan for surgical debridement and application of Kerecis tissue graft with wound vacs on Friday.  Risks and benefits were discussed patient states she understands wished to proceed at this time.  Thank you for the consult and the opportunity to see Ms. Remi Jerona Sage, MD [facility] [phone] 6:21 PM

## 2024-04-05 NOTE — ED Notes (Signed)
 Pt to vascular.

## 2024-04-05 NOTE — Progress Notes (Signed)
 Pt arrived to 6 north room 3 from ED. Pt transferred from the stretcher to the bed with stand by assist. Patient is alert and oriented x4 pain level 2/10. Bed in lowest position. Bed alarm on. Call light in reach. Food tray ordered. All needs met at this time.

## 2024-04-06 DIAGNOSIS — L97302 Non-pressure chronic ulcer of unspecified ankle with fat layer exposed: Secondary | ICD-10-CM | POA: Diagnosis not present

## 2024-04-06 DIAGNOSIS — I83003 Varicose veins of unspecified lower extremity with ulcer of ankle: Secondary | ICD-10-CM | POA: Diagnosis not present

## 2024-04-06 LAB — GLUCOSE, CAPILLARY
Glucose-Capillary: 125 mg/dL — ABNORMAL HIGH (ref 70–99)
Glucose-Capillary: 119 mg/dL — ABNORMAL HIGH (ref 70–99)
Glucose-Capillary: 141 mg/dL — ABNORMAL HIGH (ref 70–99)
Glucose-Capillary: 147 mg/dL — ABNORMAL HIGH (ref 70–99)

## 2024-04-06 LAB — HEMOGLOBIN A1C
Hgb A1c MFr Bld: 6.6 % — ABNORMAL HIGH (ref 4.8–5.6)
Mean Plasma Glucose: 142.72 mg/dL

## 2024-04-06 NOTE — Progress Notes (Signed)
 PROGRESS NOTE  [name] FMW:[id number] DOB: August 25, 1950 DOA: 04/04/2024 PCP: [facility]   LOS: 0 days   Brief Narrative / Interim history: 73 year old female with IDDM, HTN, HLD, prior CVA, seizure disorder, bipolar, chronic venous stasis ulcer who was sent to the hospital by PCP for her chronic wounds.  She was seen by orthopedic surgery about a year ago and is status post debridement.  She has a home health RN visits about once a week now, and she has been unable to manage her wounds at home to well.  She was admitted to the hospital, imaging did not show any evidence of osteomyelitis, orthopedic surgery was consulted with plans in place for surgical debridement on 10/24  Subjective / 24h Interval events: She is doing well today, no chest pain, no shortness of breath.  No abdominal discomfort, no nausea or vomiting  Assesement and Plan: Principal problem Chronic venous stasis ulcers, leg wounds, lymphedema-Main reason for her hospitalization, she was sent by PCP.  X-rays revealed soft tissue swelling but no osteomyelitis, no significant cellulitis. - Continue pain control - ABIs unremarkable bilaterally - Orthopedic surgery consulted, plans to take to the OR this Friday  Active problems Essential hypertension-continue atenolol , nifedipine   Neuropathy-continue gabapentin   Hyperlipidemia-continue statin  Bipolar/anxiety/depression-continue Zoloft, Remeron   History of gout-continue allopurinol , colchicine   Obesity, class I-BMI 34  DM2-has been placed on sliding scale, continue.  Lab Results  Component Value Date   HGBA1C 6.6 (H) 04/06/2024   CBG (last 3)  Recent Labs    04/05/24 1600 04/05/24 2047 04/06/24 0744  GLUCAP 137* 126* 125*    Scheduled Meds:  acetaminophen   1,000 mg Oral TID   allopurinol   100 mg Oral Daily   aspirin  EC  81 mg Oral Daily   atenolol   25 mg Oral Daily   atorvastatin   20 mg Oral QHS   colchicine   0.6 mg Oral Daily    cyclobenzaprine   5 mg Oral Daily   enoxaparin  (LOVENOX ) injection  40 mg Subcutaneous Q24H   feeding supplement  237 mL Oral BID BM   gabapentin   300 mg Oral TID   insulin  aspart  0-15 Units Subcutaneous TID WC   mirtazapine   7.5 mg Oral QHS   NIFEdipine   90 mg Oral Daily   sertraline  50 mg Oral Daily   Continuous Infusions: PRN Meds:.bisacodyl , ondansetron  **OR** ondansetron  (ZOFRAN ) IV, oxyCODONE , senna-docusate  Current Outpatient Medications  Medication Instructions   allopurinol  (ZYLOPRIM ) 100 mg, Oral, Daily   aspirin  EC 81 mg, Daily   atenolol  (TENORMIN ) 25 MG tablet Daily   atorvastatin  (LIPITOR) 20 mg, Oral, Daily at bedtime   cephALEXin  (KEFLEX ) 500 mg, Oral, 4 times daily   colchicine  0.6 mg, Daily   cyclobenzaprine  (FLEXERIL ) 5 mg, Daily PRN   donepezil  (ARICEPT ) 10 mg, Oral, Daily at bedtime   gabapentin  (NEURONTIN ) 300 mg, Daily   HYDROcodone -acetaminophen  (NORCO) 10-325 MG tablet 1 tablet, Oral, 2 times daily PRN   levETIRAcetam  (KEPPRA ) 750 mg, Oral, 2 times daily   mirtazapine  (REMERON ) 7.5 mg, Daily at bedtime   NIFEdipine  (ADALAT  CC) 90 mg, Daily   pentoxifylline  (TRENTAL ) 400 mg, Oral, 3 times daily with meals   sertraline (ZOLOFT) 50 mg, Oral, Daily   SYMBICORT 80-4.5 MCG/ACT inhaler 2 puffs   Tresiba  FlexTouch 30 Units, Subcutaneous, 2 times daily    Diet Orders (From admission, onward)     Start     Ordered   04/04/24 2236  Diet Carb Modified Fluid consistency:  Thin; Room service appropriate? Yes  Diet effective now       Question Answer Comment  Diet-HS Snack? Nothing   Calorie Level Medium 1600-2000   Fluid consistency: Thin   Room service appropriate? Yes      04/04/24 2235            DVT prophylaxis: enoxaparin  (LOVENOX ) injection 40 mg Start: 03/29/24 1000   Lab Results  Component Value Date   PLT 349 04/05/2024      Code Status: Full Code  Family Communication: No family at bedside  Status is: Observation The patient will  require care spanning > 2 midnights and should be moved to inpatient because: Surgery on Friday   Level of care: Med-Surg  Consultants:  Orthopedic surgery  Objective: Vitals:   04/05/24 1450 04/05/24 1515 04/05/24 2046 04/06/24 0550  BP: 125/67  135/79 130/72  Pulse: 67  63 (!) 54  Resp: 17   18  Temp: 98.9 F (37.2 C)  98 F (36.7 C) (!) 97.3 F (36.3 C)  TempSrc: Oral   Oral  SpO2: 99%  100% 100%  Weight:      Height:  5' 4 (1.626 m)     No intake or output data in the 24 hours ending 04/06/24 0911 Wt Readings from Last 3 Encounters:  04/04/24 91.6 kg  03/05/24 91.6 kg  01/13/24 97.1 kg    Examination:  Constitutional: NAD Eyes: no scleral icterus ENMT: Mucous membranes are moist.  Neck: normal, supple Respiratory: clear to auscultation bilaterally, no wheezing, no crackles.  Cardiovascular: Regular rate and rhythm, no murmurs / rubs / gallops. No LE edema.  Abdomen: non distended, no tenderness. Bowel sounds positive.  Musculoskeletal: no clubbing / cyanosis.  Skin: rash as below    Data Reviewed: I have independently reviewed following labs and imaging studies   CBC Recent Labs  Lab 04/04/24 1639 04/05/24 0501  WBC 9.1 6.3  HGB 12.9 10.5*  HCT 43.1 34.7*  PLT 451* 349  MCV 87.8 87.2  MCH 26.3 26.4  MCHC 29.9* 30.3  RDW 16.8* 17.0*  LYMPHSABS 1.8  --   MONOABS 0.5  --   EOSABS 0.2  --   BASOSABS 0.0  --     Recent Labs  Lab 04/04/24 1639 04/04/24 1644 04/04/24 2004 04/05/24 0501 04/06/24 0347  NA 139  --   --  139  --   K 3.5  --   --  3.7  --   CL 103  --   --  107  --   CO2 22  --   --  25  --   GLUCOSE 107*  --   --  127*  --   BUN 8  --   --  7*  --   CREATININE 0.89  --   --  0.77  --   CALCIUM  9.7  --   --  9.2  --   AST 17  --   --   --   --   ALT 13  --   --   --   --   ALKPHOS 113  --   --   --   --   BILITOT 0.5  --   --   --   --   ALBUMIN 3.3*  --   --   --   --   LATICACIDVEN  --  3.1* 1.3  --   --   INR 1.1  --    --   --   --  HGBA1C  --   --   --   --  6.6*    ------------------------------------------------------------------------------------------------------------------ No results for input(s): CHOL, HDL, LDLCALC, TRIG, CHOLHDL, LDLDIRECT in the last 72 hours.  Lab Results  Component Value Date   HGBA1C 6.6 (H) 04/06/2024   ------------------------------------------------------------------------------------------------------------------ No results for input(s): TSH, T4TOTAL, T3FREE, THYROIDAB in the last 72 hours.  Invalid input(s): FREET3  Cardiac Enzymes No results for input(s): CKMB, TROPONINI, MYOGLOBIN in the last 168 hours.  Invalid input(s): CK ------------------------------------------------------------------------------------------------------------------ No results found for: BNP  CBG: Recent Labs  Lab 04/05/24 0743 04/05/24 1316 04/05/24 1600 04/05/24 2047 04/06/24 0744  GLUCAP 107* 148* 137* 126* 125*    Recent Results (from the past 240 hours)  Culture, blood (Routine x 2)     Status: None (Preliminary result)   Collection Time: 04/04/24  4:38 PM   Specimen: BLOOD  Result Value Ref Range Status   Specimen Description BLOOD SITE NOT SPECIFIED  Final   Special Requests   Final    BOTTLES DRAWN AEROBIC AND ANAEROBIC Blood Culture results may not be optimal due to an inadequate volume of blood received in culture bottles   Culture   Final    NO GROWTH 2 DAYS Performed at [facility], [address], [place], [place] [address]    Report Status PENDING  Incomplete  Culture, blood (Routine x 2)     Status: None (Preliminary result)   Collection Time: 04/04/24  7:29 PM   Specimen: BLOOD RIGHT ARM  Result Value Ref Range Status   Specimen Description BLOOD RIGHT ARM  Final   Special Requests   Final    BOTTLES DRAWN AEROBIC AND ANAEROBIC Blood Culture results may not be optimal due to an inadequate volume of blood received in  culture bottles   Culture   Final    NO GROWTH 2 DAYS Performed at [facility], [address], [place], [place] [address]    Report Status PENDING  Incomplete     Radiology Studies: VAS US  ABI WITH/WO TBI Result Date: 04/05/2024  LOWER EXTREMITY DOPPLER STUDY Patient Name:  [name]  Date of Exam:   04/05/2024 Medical Rec #: [id number]        Accession #:    [id number] Date of Birth: July 29, 1950       Patient Gender: F Patient Age:   29 years Exam Location:  [facility] Procedure:      VAS US  ABI WITH/WO TBI Referring Phys: PROSPER AMPONSAH --------------------------------------------------------------------------------  Indications: Ulceration, and peripheral artery disease. Chronic venous stasis. High Risk Factors: Hypertension, hyperlipidemia, Diabetes, no history of                    smoking, prior CVA.  Limitations: Today's exam was limited due to Wrap around bandages. Comparison Study: Previous study on 5.9.2024. Performing Technologist: Edilia Elden Appl  Examination Guidelines: A complete evaluation includes at minimum, Doppler waveform signals and systolic blood pressure reading at the level of bilateral brachial, anterior tibial, and posterior tibial arteries, when vessel segments are accessible. Bilateral testing is considered an integral part of a complete examination. Photoelectric Plethysmograph (PPG) waveforms and toe systolic pressure readings are included as required and additional duplex testing as needed. Limited examinations for reoccurring indications may be performed as noted.  ABI Findings: +---------+------------------+-----+---------+--------+ Right    Rt Pressure (mmHg)IndexWaveform Comment  +---------+------------------+-----+---------+--------+ Brachial 153  triphasic         +---------+------------------+-----+---------+--------+ DP       156               0.99 biphasic           +---------+------------------+-----+---------+--------+ Great Toe115               0.73 Normal            +---------+------------------+-----+---------+--------+ +---------+------------------+-----+---------+-----------------+ Left     Lt Pressure (mmHg)IndexWaveform Comment           +---------+------------------+-----+---------+-----------------+ Brachial 157                    triphasic                  +---------+------------------+-----+---------+-----------------+ DP       156               0.99 biphasic                   +---------+------------------+-----+---------+-----------------+ Great Toe230               1.46 Normal   Falsely elevated. +---------+------------------+-----+---------+-----------------+ +-------+-----------+-----------+------------+------------+ ABI/TBIToday's ABIToday's TBIPrevious ABIPrevious TBI +-------+-----------+-----------+------------+------------+ Right  0.99       0.73                                +-------+-----------+-----------+------------+------------+ Left   0.99       1.46                                +-------+-----------+-----------+------------+------------+  Summary: Right: Resting right ankle-brachial index is within normal range. The right toe-brachial index is normal.  Left: Resting left ankle-brachial index is within normal range. The left toe-brachial index is abnormal.  *See table(s) above for measurements and observations.  Electronically signed by Norman Serve on 04/05/2024 at 11:32:51 AM.    Final      Nilda Fendt, MD, PhD [facility]  Between 7 am - 7 pm I am available, please contact me via Amion (for emergencies) or Securechat (non urgent messages)  Between 7 pm - 7 am I am not available, please contact night coverage MD/APP via Amion

## 2024-04-06 NOTE — TOC Initial Note (Signed)
 Transition of Care [facility]) - Initial/Assessment Note    Patient Details  Name: [name] MRN: [id number] Date of Birth: Sep 09, 1950  Transition of [facility]) CM/SW Contact:    Jeoffrey LITTIE Moose, [place] Phone Number: 04/06/2024, 8:34 AM  Clinical Narrative:                 Pt admitted from home with spouse due to dizziness and infected leg wounds. No current TOC needs, please consult as needs arise.        Patient Goals and CMS Choice            Expected Discharge Plan and Services                                              Prior Living Arrangements/Services                       Activities of Daily Living   ADL Screening (condition at time of admission) Independently performs ADLs?: No Does the patient have a NEW difficulty with bathing/dressing/toileting/self-feeding that is expected to last >3 days?: No Does the patient have a NEW difficulty with getting in/out of bed, walking, or climbing stairs that is expected to last >3 days?: No Does the patient have a NEW difficulty with communication that is expected to last >3 days?: No Is the patient deaf or have difficulty hearing?: No Does the patient have difficulty seeing, even when wearing glasses/contacts?: No Does the patient have difficulty concentrating, remembering, or making decisions?: No  Permission Sought/Granted                  Emotional Assessment              Admission diagnosis:  Venous stasis ulcer (HCC) [I83.009, L97.909] Patient Active Problem List   Diagnosis Date Noted   Open wound of both lower extremities 04/05/2024   Venous stasis ulcer (HCC) 04/04/2024   Seizure (HCC) 06/10/2023   Stroke-like symptoms 06/09/2023   Complex partial seizure (HCC) 06/09/2023   Ulcer of extremity due to chronic venous insufficiency (HCC) 02/19/2023   Lymphedema 02/19/2023   SBO (small bowel obstruction) (HCC) 02/18/2023   GAD (generalized anxiety disorder) 02/18/2023    Asthma, chronic 02/18/2023   Skin ulcer due to diabetes mellitus (HCC) 11/11/2022   Encounter for change or removal of nonsurgical wound dressing 11/02/2022   Type 2 diabetes mellitus with diabetic peripheral angiopathy without gangrene (HCC) 11/02/2022   Non-prs chronic ulcer oth prt l low leg w fat layer exposed (HCC) 11/02/2022   Diabetic ulcer of left lower leg (HCC) 10/24/2022   Chronic venous hypertension (idiopathic) with ulcer and inflammation of bilateral lower extremity (HCC) 10/24/2022   Non-healing wound of lower extremity 10/23/2022   Bipolar affective disorder, currently depressed, moderate (HCC) 09/02/2022   Peripheral vascular disorder due to diabetes mellitus (HCC) 09/02/2022   Alzheimer's disease (HCC) 07/14/2022   Cramps of lower extremity 07/14/2022   Other specified diabetes mellitus with diabetic polyneuropathy (HCC) 07/14/2022   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/14/2022   Type 2 diabetes mellitus with peripheral angiopathy (HCC) 07/14/2022   Hyperglycemia due to type 2 diabetes mellitus (HCC) 07/14/2022   Bipolar disorder (HCC) 06/30/2022   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) 06/30/2022   Morbid (severe) obesity due to excess calories (HCC)  06/30/2022   Bacteria in urine 06/26/2022   Stasis ulcer of lower extremity (HCC) 06/26/2022   Elevated troponin 06/25/2022   Abnormal urinalysis 06/25/2022   History of CVA (cerebrovascular accident) 06/25/2022   Memory loss 06/25/2022   Unspecified dementia, unspecified severity, with anxiety (HCC) 06/16/2022   History of falling 06/16/2022   Long term (current) use of insulin  (HCC) 06/16/2022   Other chronic pain 06/16/2022   Body mass index (BMI) 50.0-59.9, adult (HCC) 06/16/2022   Cryptogenic stroke (HCC) 06/11/2022   Middle cerebral artery embolism, 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   Non-adherence to medical treatment 03/15/2022   Chronic venous insufficiency 03/15/2022   DM  (diabetes mellitus), type 2 (HCC) 03/15/2022   Stasis dermatitis of right lower extremity with venous ulcer due to chronic peripheral venous hypertension (HCC) 03/15/2022   Venous stasis ulcers of both lower extremities (HCC) 03/15/2022   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   Wound infection 02/05/2021   Venous stasis dermatitis of both lower extremities    Pneumonia due to COVID-19 virus 06/29/2019   Hypophosphatemia 06/24/2019   Obesity 06/23/2019   Insulin  dependent type 2 diabetes mellitus (HCC) 06/22/2019   Anxiety 06/22/2019   Essential hypertension 06/22/2019   Hyperlipidemia 06/22/2019   COVID-19 06/21/2019   Severe protein-calorie malnutrition 06/21/2019   Acute respiratory failure with hypoxia (HCC) 06/21/2019   Diabetic foot ulcer associated with type 2 diabetes mellitus (HCC)    Screening for cervical cancer 03/15/2017   Peripheral edema 09/09/2016   Venous stasis ulcer of left calf limited to breakdown of skin without varicose veins (HCC) 07/23/2016   Abnormal mammogram 07/16/2016   Cellulitis 07/05/2016   Anxiety and depression 05/30/2016   Lower extremity ulceration (HCC) 05/30/2016   Generalized weakness 05/30/2016   Left arm weakness 05/30/2016   Muscle spasm of both lower legs 05/19/2016   Neuropathy 05/19/2016   Sciatica of right side 05/19/2016   Diabetes mellitus (HCC) 05/19/2016   Left Lower extremity pain  04/02/2016   Lower extremity pain, inferior, left 04/02/2016   Left leg pain    Personal history of noncompliance with medical treatment, presenting hazards to health 03/31/2016   Jaw pain 03/27/2016   Left leg cellulitis 03/24/2016   Cellulitis of left lower extremity    Primary insomnia    Adjustment disorder    Morbid obesity (HCC) 01/27/2016   Wheelchair dependent 01/27/2016   Gout 01/27/2016   Arthritis 01/27/2016   Asthma 01/27/2016   Uncontrolled type 2 diabetes mellitus with hypoglycemia, with long-term current use of insulin   (HCC) 01/25/2016   Cellulitis of left leg 01/11/2016   Insulin -requiring or dependent type II diabetes mellitus (HCC) 01/11/2016   Hypertension 01/11/2016   PCP:  [facility] [address] Pharmacy:   [facility] [id number] [place], [place] - [address] AT [address] [place] [address] Phone: [phone] Fax: [phone]  [facility] #[id number] - [place], [place] - [address] AT [facility] OF [address] [place]  [address] Phone: [phone] Fax: [phone]     Social Drivers of Health (SDOH) Social History: SDOH Screenings   Food Insecurity: No Food Insecurity (04/05/2024)  Housing: Low Risk  (04/05/2024)  Transportation Needs: No Transportation Needs (04/05/2024)  Utilities: Not At Risk (04/05/2024)  Social Connections: Moderately Isolated (04/05/2024)  Tobacco Use: Low Risk  (04/04/2024)   SDOH Interventions:     Readmission Risk Interventions     No data to display

## 2024-04-06 NOTE — Plan of Care (Signed)
 Patient was very drowsy in the morning but was wide awake in the afternoon. Dressing was change. With complaints of pain but tolerable. Needs attended. Bed alarm on. Call bell within reached Problem: Education: Goal: Ability to describe self-care measures that may prevent or decrease complications (Diabetes Survival Skills Education) will improve Outcome: Progressing Goal: Individualized Educational Video(s) Outcome: Progressing   Problem: Coping: Goal: Ability to adjust to condition or change in health will improve Outcome: Progressing   Problem: Fluid Volume: Goal: Ability to maintain a balanced intake and output will improve Outcome: Progressing   Problem: Health Behavior/Discharge Planning: Goal: Ability to identify and utilize available resources and services will improve Outcome: Progressing Goal: Ability to manage health-related needs will improve Outcome: Progressing   Problem: Metabolic: Goal: Ability to maintain appropriate glucose levels will improve Outcome: Progressing   Problem: Nutritional: Goal: Maintenance of adequate nutrition will improve Outcome: Progressing Goal: Progress toward achieving an optimal weight will improve Outcome: Progressing   Problem: Skin Integrity: Goal: Risk for impaired skin integrity will decrease Outcome: Progressing   Problem: Tissue Perfusion: Goal: Adequacy of tissue perfusion will improve Outcome: Progressing   Problem: Education: Goal: Knowledge of General Education information will improve Description: Including pain rating scale, medication(s)/side effects and non-pharmacologic comfort measures Outcome: Progressing   Problem: Health Behavior/Discharge Planning: Goal: Ability to manage health-related needs will improve Outcome: Progressing   Problem: Clinical Measurements: Goal: Ability to maintain clinical measurements within normal limits will improve Outcome: Progressing Goal: Will remain free from  infection Outcome: Progressing Goal: Diagnostic test results will improve Outcome: Progressing Goal: Respiratory complications will improve Outcome: Progressing Goal: Cardiovascular complication will be avoided Outcome: Progressing   Problem: Activity: Goal: Risk for activity intolerance will decrease Outcome: Progressing   Problem: Nutrition: Goal: Adequate nutrition will be maintained Outcome: Progressing   Problem: Coping: Goal: Level of anxiety will decrease Outcome: Progressing   Problem: Elimination: Goal: Will not experience complications related to bowel motility Outcome: Progressing Goal: Will not experience complications related to urinary retention Outcome: Progressing   Problem: Pain Managment: Goal: General experience of comfort will improve and/or be controlled Outcome: Progressing   Problem: Safety: Goal: Ability to remain free from injury will improve Outcome: Progressing   Problem: Skin Integrity: Goal: Risk for impaired skin integrity will decrease Outcome: Progressing

## 2024-04-06 NOTE — Plan of Care (Signed)
   Problem: Education: Goal: Ability to describe self-care measures that may prevent or decrease complications (Diabetes Survival Skills Education) will improve Outcome: Progressing

## 2024-04-07 DIAGNOSIS — L97302 Non-pressure chronic ulcer of unspecified ankle with fat layer exposed: Secondary | ICD-10-CM | POA: Diagnosis not present

## 2024-04-07 DIAGNOSIS — I83003 Varicose veins of unspecified lower extremity with ulcer of ankle: Secondary | ICD-10-CM | POA: Diagnosis not present

## 2024-04-07 LAB — GLUCOSE, CAPILLARY
Glucose-Capillary: 114 mg/dL — ABNORMAL HIGH (ref 70–99)
Glucose-Capillary: 162 mg/dL — ABNORMAL HIGH (ref 70–99)
Glucose-Capillary: 118 mg/dL — ABNORMAL HIGH (ref 70–99)
Glucose-Capillary: 176 mg/dL — ABNORMAL HIGH (ref 70–99)

## 2024-04-07 LAB — MAGNESIUM: Magnesium: 1.9 mg/dL (ref 1.7–2.4)

## 2024-04-07 LAB — COMPREHENSIVE METABOLIC PANEL WITH GFR
ALT: 11 U/L (ref 0–44)
AST: 11 U/L — ABNORMAL LOW (ref 15–41)
Albumin: 2.3 g/dL — ABNORMAL LOW (ref 3.5–5.0)
Alkaline Phosphatase: 88 U/L (ref 38–126)
Anion gap: 9 (ref 5–15)
BUN: 21 mg/dL (ref 8–23)
CO2: 23 mmol/L (ref 22–32)
Calcium: 9 mg/dL (ref 8.9–10.3)
Chloride: 107 mmol/L (ref 98–111)
Creatinine, Ser: 0.96 mg/dL (ref 0.44–1.00)
GFR, Estimated: >60 mL/min (ref >60)
Glucose, Bld: 115 mg/dL — ABNORMAL HIGH (ref 70–99)
Potassium: 4.2 mmol/L (ref 3.5–5.1)
Sodium: 139 mmol/L (ref 135–145)
Total Bilirubin: 0.4 mg/dL (ref 0.0–1.2)
Total Protein: 7.3 g/dL (ref 6.5–8.1)

## 2024-04-07 LAB — CBC
HCT: 35 % — ABNORMAL LOW (ref 36.0–46.0)
Hemoglobin: 10.7 g/dL — ABNORMAL LOW (ref 12.0–15.0)
MCH: 26.2 pg (ref 26.0–34.0)
MCHC: 30.6 g/dL (ref 30.0–36.0)
MCV: 85.8 fL (ref 80.0–100.0)
Platelets: 350 K/uL (ref 150–400)
RBC: 4.08 MIL/uL (ref 3.87–5.11)
RDW: 17.2 % — ABNORMAL HIGH (ref 11.5–15.5)
WBC: 5.7 K/uL (ref 4.0–10.5)
nRBC: 0 % (ref 0.0–0.2)

## 2024-04-07 MED ORDER — CHLORHEXIDINE GLUCONATE 4 % EX SOLN
60.0000 mL | Freq: Once | CUTANEOUS | Status: AC
  Administered 2024-04-08: 4 via TOPICAL
  Filled 2024-04-07: qty 60

## 2024-04-07 NOTE — H&P (Signed)
 [name] is an 73 y.o. female.   Chief Complaint: Chronic venous insufficiency ulcers bilateral lower extremities.   HPI: [name] is a 73 y.o. female who presents with chronic venous ulcers anterior aspect of both ankles.  Patient is status post debridement of both lower extremities a year ago.  Past Medical History:  Diagnosis Date   Anxiety    Arthritis    back, arms, legs (03/24/2016)   Asthma    Chronic lower back pain    Colonic polyp    last colonoscopy done in 2009 with normal results per medical record   Depressive disorder    Gastric polyp    Gout    has taken allopurinol  300mg  once daily in past   Headache    History of hiatal hernia    Hypertension    Migraine    none in awhile; might have a couple/year (03/24/2016)   Mixed hyperlipidemia    01/2016 Total chol 141, HDL 59, LDL 63, ration 1.1   Osteoarthritis    TIA (transient ischemic attack) 11/2014   Type II diabetes mellitus (HCC)     Past Surgical History:  Procedure Laterality Date   ARTERY BIOPSY Right 08/12/2016   Procedure: BIOPSY TEMPORAL ARTERY;  Surgeon: Carlin FORBES Haddock, MD;  Location: [facility] OR;  Service: Vascular;  Laterality: Right;  BIOPSY TEMPORAL ARTERY   BREAST BIOPSY Left ~ 2015   benign   CARPAL TUNNEL RELEASE Bilateral    DILATION AND CURETTAGE OF UTERUS     I & D EXTREMITY Bilateral 10/24/2022   Procedure: DEBRIDEMENT LEFT LEG AND RIGHT ANKLE;  Surgeon: Harden Jerona GAILS, MD;  Location: [facility] OR;  Service: Orthopedics;  Laterality: Bilateral;   IR CT HEAD LTD  06/11/2022   IR PERCUTANEOUS ART THROMBECTOMY/INFUSION INTRACRANIAL INC DIAG ANGIO  06/11/2022   IR US  GUIDE VASC ACCESS RIGHT  06/11/2022   KNEE ARTHROSCOPY Right 2003   in [facility]   LAPAROSCOPIC CHOLECYSTECTOMY     LOOP RECORDER INSERTION N/A 06/26/2022   Procedure: LOOP RECORDER INSERTION;  Surgeon: Inocencio Soyla Lunger, MD;  Location: [facility] INVASIVE CV LAB;  Service: Cardiovascular;  Laterality: N/A;   RADIOLOGY WITH ANESTHESIA  N/A 06/11/2022   Procedure: IR WITH ANESTHESIA;  Surgeon: Radiologist, Medication, MD;  Location: [facility] OR;  Service: Radiology;  Laterality: N/A;   TUBAL LIGATION     VAGINAL HYSTERECTOMY  1982    Family History  Problem Relation Age of Onset   Stroke Father    Stroke Brother    Cancer Other    Gout Mother    Hypertension Mother    Arthritis Mother    Asthma Mother    Stroke Brother    Social History:  reports that she has never smoked. She has never used smokeless tobacco. She reports that she does not drink alcohol and does not use drugs.  Allergies:  Allergies  Allergen Reactions   Ace Inhibitors Swelling   Latex Itching and Swelling   Ultram [Tramadol] Nausea And Vomiting   Zanaflex [Tizanidine] Other (See Comments)    Tremors    Diprivan  [Propofol ] Itching   Metformin And Related Other (See Comments)    Tremors  Chills    Medications Prior to Admission  Medication Sig Dispense Refill   aspirin  EC 81 MG tablet Take 81 mg by mouth daily. Swallow whole.     atenolol  (TENORMIN ) 25 MG tablet Take by mouth daily.     atorvastatin  (LIPITOR) 20 MG tablet Take 1  tablet (20 mg total) by mouth at bedtime. 30 tablet 0   colchicine  0.6 MG tablet Take 0.6 mg by mouth daily.     cyclobenzaprine  (FLEXERIL ) 5 MG tablet Take 5 mg by mouth daily as needed for muscle spasms.     gabapentin  (NEURONTIN ) 300 MG capsule Take 300 mg by mouth daily.     NIFEdipine  (ADALAT  CC) 90 MG 24 hr tablet Take 90 mg by mouth daily.     allopurinol  (ZYLOPRIM ) 100 MG tablet Take 100 mg by mouth daily.     cephALEXin  (KEFLEX ) 500 MG capsule Take 500 mg by mouth 4 (four) times daily.     donepezil  (ARICEPT ) 10 MG tablet Take 10 mg by mouth at bedtime.     HYDROcodone -acetaminophen  (NORCO) 10-325 MG tablet Take 1 tablet by mouth 2 (two) times daily as needed.     levETIRAcetam  (KEPPRA ) 750 MG tablet Take 1 tablet (750 mg total) by mouth 2 (two) times daily. 180 tablet 4   mirtazapine  (REMERON ) 7.5 MG tablet  Take 7.5 mg by mouth at bedtime. (Patient not taking: Reported on 04/05/2024)     pentoxifylline  (TRENTAL ) 400 MG CR tablet Take 1 tablet (400 mg total) by mouth 3 (three) times daily with meals. 90 tablet 3   sertraline (ZOLOFT) 50 MG tablet Take 50 mg by mouth daily.     SYMBICORT 80-4.5 MCG/ACT inhaler Inhale 2 puffs into the lungs.     TRESIBA  FLEXTOUCH 200 UNIT/ML FlexTouch Pen Inject 30 Units into the skin 2 (two) times daily.      Results for orders placed or performed during the hospital encounter of 04/04/24 (from the past 48 hours)  Glucose, capillary     Status: Abnormal   Collection Time: 04/05/24  8:47 PM  Result Value Ref Range   Glucose-Capillary 126 (H) 70 - 99 mg/dL    Comment: Glucose reference range applies only to samples taken after fasting for at least 8 hours.  Hemoglobin A1c     Status: Abnormal   Collection Time: 04/06/24  3:47 AM  Result Value Ref Range   Hgb A1c MFr Bld 6.6 (H) 4.8 - 5.6 %    Comment: (NOTE) Diagnosis of Diabetes The following HbA1c ranges recommended by the American Diabetes Association (ADA) may be used as an aid in the diagnosis of diabetes mellitus.  Hemoglobin             Suggested A1C NGSP%              Diagnosis  <5.7                   Non Diabetic  5.7-6.4                Pre-Diabetic  >6.4                   Diabetic  <7.0                   Glycemic control for                       adults with diabetes.     Mean Plasma Glucose 142.72 mg/dL    Comment: Performed at [facility], [address] [place], [place] [address]  Glucose, capillary     Status: Abnormal   Collection Time: 04/06/24  7:44 AM  Result Value Ref Range   Glucose-Capillary 125 (H) 70 - 99 mg/dL  Comment: Glucose reference range applies only to samples taken after fasting for at least 8 hours.  Glucose, capillary     Status: Abnormal   Collection Time: 04/06/24 11:40 AM  Result Value Ref Range   Glucose-Capillary 119 (H) 70 - 99 mg/dL     Comment: Glucose reference range applies only to samples taken after fasting for at least 8 hours.  Glucose, capillary     Status: Abnormal   Collection Time: 04/06/24  4:52 PM  Result Value Ref Range   Glucose-Capillary 141 (H) 70 - 99 mg/dL    Comment: Glucose reference range applies only to samples taken after fasting for at least 8 hours.  Glucose, capillary     Status: Abnormal   Collection Time: 04/06/24  9:25 PM  Result Value Ref Range   Glucose-Capillary 147 (H) 70 - 99 mg/dL    Comment: Glucose reference range applies only to samples taken after fasting for at least 8 hours.  Comprehensive metabolic panel with GFR     Status: Abnormal   Collection Time: 04/07/24  3:00 AM  Result Value Ref Range   Sodium 139 135 - 145 mmol/L   Potassium 4.2 3.5 - 5.1 mmol/L   Chloride 107 98 - 111 mmol/L   CO2 23 22 - 32 mmol/L   Glucose, Bld 115 (H) 70 - 99 mg/dL    Comment: Glucose reference range applies only to samples taken after fasting for at least 8 hours.   BUN 21 8 - 23 mg/dL   Creatinine, Ser 9.03 0.44 - 1.00 mg/dL   Calcium  9.0 8.9 - 10.3 mg/dL   Total Protein 7.3 6.5 - 8.1 g/dL   Albumin 2.3 (L) 3.5 - 5.0 g/dL   AST 11 (L) 15 - 41 U/L   ALT 11 0 - 44 U/L   Alkaline Phosphatase 88 38 - 126 U/L   Total Bilirubin 0.4 0.0 - 1.2 mg/dL   GFR, Estimated >39 >39 mL/min    Comment: (NOTE) Calculated using the CKD-EPI Creatinine Equation (2021)    Anion gap 9 5 - 15    Comment: Performed at [facility], [address], [place], [place] [address]  CBC     Status: Abnormal   Collection Time: 04/07/24  3:00 AM  Result Value Ref Range   WBC 5.7 4.0 - 10.5 K/uL   RBC 4.08 3.87 - 5.11 MIL/uL   Hemoglobin 10.7 (L) 12.0 - 15.0 g/dL   HCT 64.9 (L) 63.9 - 53.9 %   MCV 85.8 80.0 - 100.0 fL   MCH 26.2 26.0 - 34.0 pg   MCHC 30.6 30.0 - 36.0 g/dL   RDW 82.7 (H) 88.4 - 84.4 %   Platelets 350 150 - 400 K/uL   nRBC 0.0 0.0 - 0.2 %    Comment: Performed at [facility], [address] [place], [place] [address]  Magnesium      Status: None   Collection Time: 04/07/24  3:00 AM  Result Value Ref Range   Magnesium  1.9 1.7 - 2.4 mg/dL    Comment: Performed at [facility], [address], [place], [place] [address]  Glucose, capillary     Status: Abnormal   Collection Time: 04/07/24  8:06 AM  Result Value Ref Range   Glucose-Capillary 114 (H) 70 - 99 mg/dL    Comment: Glucose reference range applies only to samples taken after fasting for at least 8 hours.  Glucose, capillary  Status: Abnormal   Collection Time: 04/07/24 11:58 AM  Result Value Ref Range   Glucose-Capillary 162 (H) 70 - 99 mg/dL    Comment: Glucose reference range applies only to samples taken after fasting for at least 8 hours.  Glucose, capillary     Status: Abnormal   Collection Time: 04/07/24  5:10 PM  Result Value Ref Range   Glucose-Capillary 118 (H) 70 - 99 mg/dL    Comment: Glucose reference range applies only to samples taken after fasting for at least 8 hours.   No results found.  Review of Systems  All other systems reviewed and are negative.   Blood pressure 131/78, pulse 95, temperature 98.1 F (36.7 C), temperature source Oral, resp. rate 18, height 5' 4 (1.626 m), weight 91.6 kg, SpO2 100%. Physical Exam  General: Alert, no acute distress Psychiatric: Patient is competent for consent with normal mood and affect Lymphatic: No axillary or cervical lymphadenopathy Cardiovascular: No pedal edema Respiratory: No cyanosis, no use of accessory musculature GI: No organomegaly, abdomen is soft and non-tender Skin: Examination patient has brawny skin color changes with papillomata's changes of both lower extremities.  She has a palpable dorsalis pedis pulse bilaterally.  There is healthy granulation tissue with extensive ulceration anteriorly over the ankles bilaterally.  Most recent hemoglobin A1c is 6.4.   No ascending cellulitis.  There is healthy granulation tissue in the  wound beds there is no tunneling. Assessment: Assessment: Bilateral lower extremity venous and lymphatic ulcers anterior aspect of the ankle.   Plan: Plan: Will plan for surgical debridement and application of Kerecis tissue graft with wound vacs on Friday.  Risks and benefits were discussed patient states she understands wished to proceed at this time.     Maurilio Deland Collet, PA-C 04/07/2024, 6:05 PM

## 2024-04-07 NOTE — Progress Notes (Signed)
 PROGRESS NOTE  [name] FMW:[id number] DOB: 02/07/1951 DOA: 04/04/2024 PCP: [facility] [address] [facility]   LOS: 0 days   Brief Narrative / Interim history: 73 year old female with IDDM, HTN, HLD, prior CVA, seizure disorder, bipolar, chronic venous stasis ulcer who was sent to the hospital by PCP for her chronic wounds.  She was seen by orthopedic surgery about a year ago and is status post debridement.  She has a home health RN visits about once a week now, and she has been unable to manage her wounds at home to well.  She was admitted to the hospital, imaging did not show any evidence of osteomyelitis, orthopedic surgery was consulted with plans in place for surgical debridement on 10/24  Subjective / 24h Interval events: Complains of some low-level foot pain, no chest discomfort, no shortness of breath  Assesement and Plan: Principal problem Chronic venous stasis ulcers, leg wounds, lymphedema-Main reason for her hospitalization, she was sent by PCP.  X-rays revealed soft tissue swelling but no osteomyelitis, no significant cellulitis. - Continue pain control - ABIs unremarkable bilaterally - Orthopedic surgery consulted, plans to take to the OR tomorrow  Active problems Essential hypertension-continue atenolol , nifedipine , blood pressure is controlled  Neuropathy-continue gabapentin   Hyperlipidemia-continue statin  Bipolar/anxiety/depression-continue Zoloft, Remeron   History of gout-continue allopurinol , colchicine   Obesity, class I-BMI 34  DM2-has been placed on sliding scale, continue.  CBGs reviewed and are acceptable  Lab Results  Component Value Date   HGBA1C 6.6 (H) 04/06/2024   CBG (last 3)  Recent Labs    04/06/24 1652 04/06/24 2125 04/07/24 0806  GLUCAP 141* 147* 114*    Scheduled Meds:  acetaminophen   1,000 mg Oral TID   allopurinol   100 mg Oral Daily   aspirin  EC  81 mg Oral Daily   atenolol   25 mg Oral Daily   atorvastatin   20 mg Oral QHS    colchicine   0.6 mg Oral Daily   cyclobenzaprine   5 mg Oral Daily   enoxaparin  (LOVENOX ) injection  40 mg Subcutaneous Q24H   feeding supplement  237 mL Oral BID BM   gabapentin   300 mg Oral TID   insulin  aspart  0-15 Units Subcutaneous TID WC   mirtazapine   7.5 mg Oral QHS   NIFEdipine   90 mg Oral Daily   sertraline  50 mg Oral Daily   Continuous Infusions: PRN Meds:.bisacodyl , ondansetron  **OR** ondansetron  (ZOFRAN ) IV, oxyCODONE , senna-docusate  Current Outpatient Medications  Medication Instructions   allopurinol  (ZYLOPRIM ) 100 mg, Oral, Daily   aspirin  EC 81 mg, Daily   atenolol  (TENORMIN ) 25 MG tablet Daily   atorvastatin  (LIPITOR) 20 mg, Oral, Daily at bedtime   cephALEXin  (KEFLEX ) 500 mg, Oral, 4 times daily   colchicine  0.6 mg, Daily   cyclobenzaprine  (FLEXERIL ) 5 mg, Daily PRN   donepezil  (ARICEPT ) 10 mg, Oral, Daily at bedtime   gabapentin  (NEURONTIN ) 300 mg, Daily   HYDROcodone -acetaminophen  (NORCO) 10-325 MG tablet 1 tablet, Oral, 2 times daily PRN   levETIRAcetam  (KEPPRA ) 750 mg, Oral, 2 times daily   mirtazapine  (REMERON ) 7.5 mg, Daily at bedtime   NIFEdipine  (ADALAT  CC) 90 mg, Daily   pentoxifylline  (TRENTAL ) 400 mg, Oral, 3 times daily with meals   sertraline (ZOLOFT) 50 mg, Oral, Daily   SYMBICORT 80-4.5 MCG/ACT inhaler 2 puffs   Tresiba  FlexTouch 30 Units, Subcutaneous, 2 times daily    Diet Orders (From admission, onward)     Start     Ordered   04/04/24 2236  Diet Carb Modified  Fluid consistency: Thin; Room service appropriate? Yes  Diet effective now       Question Answer Comment  Diet-HS Snack? Nothing   Calorie Level Medium 1600-2000   Fluid consistency: Thin   Room service appropriate? Yes      04/04/24 2235            DVT prophylaxis: enoxaparin  (LOVENOX ) injection 40 mg Start: 03/29/24 1000   Lab Results  Component Value Date   PLT 350 04/07/2024      Code Status: Full Code  Family Communication: No family at bedside  Status  is: Observation The patient will require care spanning > 2 midnights and should be moved to inpatient because: Surgery on Friday   Level of care: Med-Surg  Consultants:  Orthopedic surgery  Objective: Vitals:   04/06/24 1840 04/06/24 2139 04/07/24 0542 04/07/24 0935  BP: 121/68 116/67 128/72 131/78  Pulse: 82 83 70 95  Resp: 17 16 16 18   Temp: (!) 97.3 F (36.3 C) 97.7 F (36.5 C) (!) 97.5 F (36.4 C) 98.1 F (36.7 C)  TempSrc: Oral Oral Oral Oral  SpO2: 97% 99% 100% 100%  Weight:      Height:        Intake/Output Summary (Last 24 hours) at 04/07/2024 0949 Last data filed at 04/06/2024 1839 Gross per 24 hour  Intake 1440 ml  Output --  Net 1440 ml   Wt Readings from Last 3 Encounters:  04/04/24 91.6 kg  03/05/24 91.6 kg  01/13/24 97.1 kg    Examination:  Constitutional: NAD Eyes: lids and conjunctivae normal, no scleral icterus ENMT: mmm Neck: normal, supple Respiratory: clear to auscultation bilaterally, no wheezing, no crackles.  Cardiovascular: Regular rate and rhythm, no murmurs / rubs / gallops. No LE edema. Abdomen: soft, no distention, no tenderness. Bowel sounds positive.   Data Reviewed: I have independently reviewed following labs and imaging studies   CBC Recent Labs  Lab 04/04/24 1639 04/05/24 0501 04/07/24 0300  WBC 9.1 6.3 5.7  HGB 12.9 10.5* 10.7*  HCT 43.1 34.7* 35.0*  PLT 451* 349 350  MCV 87.8 87.2 85.8  MCH 26.3 26.4 26.2  MCHC 29.9* 30.3 30.6  RDW 16.8* 17.0* 17.2*  LYMPHSABS 1.8  --   --   MONOABS 0.5  --   --   EOSABS 0.2  --   --   BASOSABS 0.0  --   --     Recent Labs  Lab 04/04/24 1639 04/04/24 1644 04/04/24 2004 04/05/24 0501 04/06/24 0347 04/07/24 0300  NA 139  --   --  139  --  139  K 3.5  --   --  3.7  --  4.2  CL 103  --   --  107  --  107  CO2 22  --   --  25  --  23  GLUCOSE 107*  --   --  127*  --  115*  BUN 8  --   --  7*  --  21  CREATININE 0.89  --   --  0.77  --  0.96  CALCIUM  9.7  --   --  9.2   --  9.0  AST 17  --   --   --   --  11*  ALT 13  --   --   --   --  11  ALKPHOS 113  --   --   --   --  88  BILITOT 0.5  --   --   --   --  0.4  ALBUMIN 3.3*  --   --   --   --  2.3*  MG  --   --   --   --   --  1.9  LATICACIDVEN  --  3.1* 1.3  --   --   --   INR 1.1  --   --   --   --   --   HGBA1C  --   --   --   --  6.6*  --     ------------------------------------------------------------------------------------------------------------------ No results for input(s): CHOL, HDL, LDLCALC, TRIG, CHOLHDL, LDLDIRECT in the last 72 hours.  Lab Results  Component Value Date   HGBA1C 6.6 (H) 04/06/2024   ------------------------------------------------------------------------------------------------------------------ No results for input(s): TSH, T4TOTAL, T3FREE, THYROIDAB in the last 72 hours.  Invalid input(s): FREET3  Cardiac Enzymes No results for input(s): CKMB, TROPONINI, MYOGLOBIN in the last 168 hours.  Invalid input(s): CK ------------------------------------------------------------------------------------------------------------------ No results found for: BNP  CBG: Recent Labs  Lab 04/06/24 0744 04/06/24 1140 04/06/24 1652 04/06/24 2125 04/07/24 0806  GLUCAP 125* 119* 141* 147* 114*    Recent Results (from the past 240 hours)  Culture, blood (Routine x 2)     Status: None (Preliminary result)   Collection Time: 04/04/24  4:38 PM   Specimen: BLOOD  Result Value Ref Range Status   Specimen Description BLOOD SITE NOT SPECIFIED  Final   Special Requests   Final    BOTTLES DRAWN AEROBIC AND ANAEROBIC Blood Culture results may not be optimal due to an inadequate volume of blood received in culture bottles   Culture   Final    NO GROWTH 3 DAYS Performed at [facility], [address] [place], [place] [address]    Report Status PENDING  Incomplete  Culture, blood (Routine x 2)     Status: None (Preliminary result)    Collection Time: 04/04/24  7:29 PM   Specimen: BLOOD RIGHT ARM  Result Value Ref Range Status   Specimen Description BLOOD RIGHT ARM  Final   Special Requests   Final    BOTTLES DRAWN AEROBIC AND ANAEROBIC Blood Culture results may not be optimal due to an inadequate volume of blood received in culture bottles   Culture   Final    NO GROWTH 3 DAYS Performed at [facility], [address] [place], [place] [address]    Report Status PENDING  Incomplete     Radiology Studies: No results found.    Nilda Fendt, MD, PhD [facility]  Between 7 am - 7 pm I am available, please contact me via Amion (for emergencies) or Securechat (non urgent messages)  Between 7 pm - 7 am I am not available, please contact night coverage MD/APP via Amion

## 2024-04-07 NOTE — Plan of Care (Signed)
 Wound dressing changed. Problem: Education: Goal: Ability to describe self-care measures that may prevent or decrease complications (Diabetes Survival Skills Education) will improve Outcome: Progressing Goal: Individualized Educational Video(s) Outcome: Progressing   Problem: Coping: Goal: Ability to adjust to condition or change in health will improve Outcome: Progressing   Problem: Fluid Volume: Goal: Ability to maintain a balanced intake and output will improve Outcome: Progressing   Problem: Health Behavior/Discharge Planning: Goal: Ability to identify and utilize available resources and services will improve Outcome: Progressing Goal: Ability to manage health-related needs will improve Outcome: Progressing   Problem: Metabolic: Goal: Ability to maintain appropriate glucose levels will improve Outcome: Progressing   Problem: Nutritional: Goal: Maintenance of adequate nutrition will improve Outcome: Progressing Goal: Progress toward achieving an optimal weight will improve Outcome: Progressing   Problem: Skin Integrity: Goal: Risk for impaired skin integrity will decrease Outcome: Progressing   Problem: Tissue Perfusion: Goal: Adequacy of tissue perfusion will improve Outcome: Progressing   Problem: Education: Goal: Knowledge of General Education information will improve Description: Including pain rating scale, medication(s)/side effects and non-pharmacologic comfort measures Outcome: Progressing   Problem: Health Behavior/Discharge Planning: Goal: Ability to manage health-related needs will improve Outcome: Progressing   Problem: Clinical Measurements: Goal: Ability to maintain clinical measurements within normal limits will improve Outcome: Progressing Goal: Will remain free from infection Outcome: Progressing Goal: Diagnostic test results will improve Outcome: Progressing Goal: Respiratory complications will improve Outcome: Progressing Goal:  Cardiovascular complication will be avoided Outcome: Progressing   Problem: Activity: Goal: Risk for activity intolerance will decrease Outcome: Progressing   Problem: Nutrition: Goal: Adequate nutrition will be maintained Outcome: Progressing   Problem: Coping: Goal: Level of anxiety will decrease Outcome: Progressing   Problem: Elimination: Goal: Will not experience complications related to bowel motility Outcome: Progressing Goal: Will not experience complications related to urinary retention Outcome: Progressing   Problem: Pain Managment: Goal: General experience of comfort will improve and/or be controlled Outcome: Progressing   Problem: Safety: Goal: Ability to remain free from injury will improve Outcome: Progressing   Problem: Skin Integrity: Goal: Risk for impaired skin integrity will decrease Outcome: Progressing

## 2024-04-08 ENCOUNTER — Encounter (HOSPITAL_COMMUNITY): Payer: Self-pay | Admitting: Student

## 2024-04-08 ENCOUNTER — Observation Stay (HOSPITAL_COMMUNITY): Admitting: Anesthesiology

## 2024-04-08 ENCOUNTER — Encounter (HOSPITAL_COMMUNITY)
Admission: EM | Disposition: A | Discharge status: Home Health | Payer: Self-pay | Source: Ambulatory Visit | Attending: Internal Medicine

## 2024-04-08 DIAGNOSIS — L97319 Non-pressure chronic ulcer of right ankle with unspecified severity: Secondary | ICD-10-CM | POA: Diagnosis not present

## 2024-04-08 DIAGNOSIS — E119 Type 2 diabetes mellitus without complications: Secondary | ICD-10-CM

## 2024-04-08 DIAGNOSIS — I83013 Varicose veins of right lower extremity with ulcer of ankle: Secondary | ICD-10-CM | POA: Diagnosis not present

## 2024-04-08 DIAGNOSIS — L02612 Cutaneous abscess of left foot: Secondary | ICD-10-CM

## 2024-04-08 DIAGNOSIS — L97329 Non-pressure chronic ulcer of left ankle with unspecified severity: Secondary | ICD-10-CM

## 2024-04-08 DIAGNOSIS — I10 Essential (primary) hypertension: Secondary | ICD-10-CM | POA: Diagnosis not present

## 2024-04-08 DIAGNOSIS — L97312 Non-pressure chronic ulcer of right ankle with fat layer exposed: Secondary | ICD-10-CM | POA: Diagnosis not present

## 2024-04-08 DIAGNOSIS — I83003 Varicose veins of unspecified lower extremity with ulcer of ankle: Secondary | ICD-10-CM | POA: Diagnosis not present

## 2024-04-08 DIAGNOSIS — L97302 Non-pressure chronic ulcer of unspecified ankle with fat layer exposed: Secondary | ICD-10-CM | POA: Diagnosis not present

## 2024-04-08 HISTORY — PX: APPLICATION OF WOUND VAC: SHX5189

## 2024-04-08 HISTORY — PX: INCISION AND DRAINAGE OF DEEP ABSCESS, CALF: SHX7361

## 2024-04-08 LAB — SURGICAL PCR SCREEN
MRSA, PCR: NEGATIVE
Staphylococcus aureus: NEGATIVE

## 2024-04-08 LAB — GLUCOSE, CAPILLARY
Glucose-Capillary: 113 mg/dL — ABNORMAL HIGH (ref 70–99)
Glucose-Capillary: 87 mg/dL (ref 70–99)
Glucose-Capillary: 91 mg/dL (ref 70–99)
Glucose-Capillary: 95 mg/dL (ref 70–99)

## 2024-04-08 LAB — NO BLOOD PRODUCTS

## 2024-04-08 SURGERY — INCISION AND DRAINAGE OF DEEP ABSCESS, CALF
Anesthesia: Monitor Anesthesia Care | Laterality: Bilateral

## 2024-04-08 MED ORDER — INSULIN ASPART 100 UNIT/ML IJ SOLN: 0.0000 [IU] | INTRAMUSCULAR | Status: DC | PRN

## 2024-04-08 MED ORDER — FENTANYL CITRATE (PF) 100 MCG/2ML IJ SOLN
INTRAMUSCULAR | Status: AC
  Filled 2024-04-08: qty 2

## 2024-04-08 MED ORDER — OXYCODONE HCL 5 MG PO TABS: 5.0000 mg | ORAL_TABLET | Freq: Once | ORAL | Status: DC | PRN

## 2024-04-08 MED ORDER — HYDROMORPHONE HCL 1 MG/ML IJ SOLN: 0.5000 mg | INTRAMUSCULAR | Status: DC | PRN

## 2024-04-08 MED ORDER — PHENYLEPHRINE HCL-NACL 20-0.9 MG/250ML-% IV SOLN
INTRAVENOUS | Status: DC | PRN
  Administered 2024-04-08: 30 ug/min via INTRAVENOUS

## 2024-04-08 MED ORDER — ONDANSETRON HCL 4 MG/2ML IJ SOLN
INTRAMUSCULAR | Status: DC | PRN
  Administered 2024-04-08: 4 mg via INTRAVENOUS

## 2024-04-08 MED ORDER — LIDOCAINE 2% (20 MG/ML) 5 ML SYRINGE
INTRAMUSCULAR | Status: DC | PRN
  Administered 2024-04-08: 60 mg via INTRAVENOUS

## 2024-04-08 MED ORDER — POVIDONE-IODINE 10 % EX SWAB
2.0000 | Freq: Once | CUTANEOUS | Status: AC
  Administered 2024-04-08: 2 via TOPICAL

## 2024-04-08 MED ORDER — PROPOFOL 10 MG/ML IV BOLUS
INTRAVENOUS | Status: DC | PRN
  Administered 2024-04-08: 150 mg via INTRAVENOUS

## 2024-04-08 MED ORDER — OXYCODONE HCL 5 MG PO TABS
5.0000 mg | ORAL_TABLET | ORAL | Status: DC | PRN
  Administered 2024-04-08: 5 mg via ORAL
  Administered 2024-04-09 – 2024-04-10 (×4): 10 mg via ORAL
  Filled 2024-04-08 (×5): qty 2

## 2024-04-08 MED ORDER — ACETAMINOPHEN 10 MG/ML IV SOLN: 1000.0000 mg | Freq: Once | INTRAVENOUS | Status: DC | PRN

## 2024-04-08 MED ORDER — OXYCODONE HCL 5 MG/5ML PO SOLN: 5.0000 mg | Freq: Once | ORAL | Status: DC | PRN

## 2024-04-08 MED ORDER — ORAL CARE MOUTH RINSE: 15.0000 mL | Freq: Once | OROMUCOSAL | Status: AC

## 2024-04-08 MED ORDER — LACTATED RINGERS IV SOLN
INTRAVENOUS | Status: DC
Start: 2024-04-08 — End: 2024-04-08

## 2024-04-08 MED ORDER — CHLORHEXIDINE GLUCONATE 0.12 % MT SOLN
15.0000 mL | Freq: Once | OROMUCOSAL | Status: AC
Start: 2024-04-08 — End: 2024-04-08
  Filled 2024-04-08: qty 15

## 2024-04-08 MED ORDER — VASHE WOUND IRRIGATION OPTIME
TOPICAL | Status: DC | PRN
  Administered 2024-04-08 (×2): 34 [oz_av]

## 2024-04-08 MED ORDER — CEFAZOLIN SODIUM-DEXTROSE 2-4 GM/100ML-% IV SOLN
2.0000 g | INTRAVENOUS | Status: AC
  Administered 2024-04-08: 2 g via INTRAVENOUS
  Filled 2024-04-08: qty 100

## 2024-04-08 MED ORDER — FENTANYL CITRATE (PF) 100 MCG/2ML IJ SOLN
25.0000 ug | INTRAMUSCULAR | Status: DC | PRN
  Administered 2024-04-08 (×2): 50 ug via INTRAVENOUS

## 2024-04-08 MED ORDER — FENTANYL CITRATE (PF) 100 MCG/2ML IJ SOLN
INTRAMUSCULAR | Status: DC | PRN
  Administered 2024-04-08 (×2): 25 ug via INTRAVENOUS
  Administered 2024-04-08: 50 ug via INTRAVENOUS

## 2024-04-08 MED ORDER — LIDOCAINE HCL 1 % IJ SOLN
INTRAMUSCULAR | Status: AC
Start: 2024-04-08 — End: 2024-04-08
  Filled 2024-04-08: qty 20

## 2024-04-08 MED ORDER — PHENYLEPHRINE 80 MCG/ML (10ML) SYRINGE FOR IV PUSH (FOR BLOOD PRESSURE SUPPORT)
PREFILLED_SYRINGE | INTRAVENOUS | Status: DC | PRN
  Administered 2024-04-08: 160 ug via INTRAVENOUS

## 2024-04-08 MED ORDER — CHLORHEXIDINE GLUCONATE 0.12 % MT SOLN
OROMUCOSAL | Status: AC
  Administered 2024-04-08: 15 mL via OROMUCOSAL
  Filled 2024-04-08: qty 15

## 2024-04-08 MED ORDER — DROPERIDOL 2.5 MG/ML IJ SOLN: 0.6250 mg | Freq: Once | INTRAMUSCULAR | Status: DC | PRN

## 2024-04-08 SURGICAL SUPPLY — 39 items
BAG COUNTER SPONGE SURGICOUNT (BAG) IMPLANT
BLADE SURG 21 STRL SS (BLADE) ×1 IMPLANT
BNDG COHESIVE 4X5 TAN STRL LF (GAUZE/BANDAGES/DRESSINGS) IMPLANT
BNDG COHESIVE 6X5 TAN NS LF (GAUZE/BANDAGES/DRESSINGS) IMPLANT
BNDG COHESIVE 6X5 TAN ST LF (GAUZE/BANDAGES/DRESSINGS) IMPLANT
BNDG GAUZE DERMACEA FLUFF 4 (GAUZE/BANDAGES/DRESSINGS) IMPLANT
CANISTER WOUNDNEG PRESSURE 500 (CANNISTER) IMPLANT
CLEANSER WND VASHE 34 (WOUND CARE) IMPLANT
CLEANSER WND VASHE INSTL 34OZ (WOUND CARE) IMPLANT
COVER SURGICAL LIGHT HANDLE (MISCELLANEOUS) ×2 IMPLANT
DRAPE DERMATAC (DRAPES) IMPLANT
DRAPE INCISE IOBAN 66X45 STRL (DRAPES) IMPLANT
DRAPE U-SHAPE 47X51 STRL (DRAPES) ×1 IMPLANT
DRESSING PREVENA PLUS CUSTOM (GAUZE/BANDAGES/DRESSINGS) IMPLANT
DRESSING VERAFLO CLEANS CC MED (GAUZE/BANDAGES/DRESSINGS) IMPLANT
DRSG ADAPTIC 3X8 NADH LF (GAUZE/BANDAGES/DRESSINGS) ×1 IMPLANT
DURAPREP 26ML APPLICATOR (WOUND CARE) ×1 IMPLANT
ELECTRODE REM PT RTRN 9FT ADLT (ELECTROSURGICAL) IMPLANT
GAUZE PAD ABD 8X10 STRL (GAUZE/BANDAGES/DRESSINGS) IMPLANT
GAUZE SPONGE 4X4 12PLY STRL (GAUZE/BANDAGES/DRESSINGS) IMPLANT
GLOVE BIOGEL PI IND STRL 9 (GLOVE) ×1 IMPLANT
GLOVE SURG ORTHO 9.0 STRL STRW (GLOVE) ×1 IMPLANT
GOWN STRL REUS W/ TWL XL LVL3 (GOWN DISPOSABLE) ×2 IMPLANT
GRAFT SKIN WND SURGICLOSE M95 (Tissue) IMPLANT
KIT BASIN OR (CUSTOM PROCEDURE TRAY) ×1 IMPLANT
KIT TURNOVER KIT B (KITS) ×1 IMPLANT
MANIFOLD NEPTUNE II (INSTRUMENTS) ×1 IMPLANT
PACK ORTHO EXTREMITY (CUSTOM PROCEDURE TRAY) ×1 IMPLANT
PAD ARMBOARD POSITIONER FOAM (MISCELLANEOUS) ×2 IMPLANT
PAD NEG PRESSURE SENSATRAC (MISCELLANEOUS) IMPLANT
SET HNDPC FAN SPRY TIP SCT (DISPOSABLE) IMPLANT
SOLN 0.9% NACL POUR BTL 1000ML (IV SOLUTION) ×1 IMPLANT
STOCKINETTE IMPERVIOUS 9X36 MD (GAUZE/BANDAGES/DRESSINGS) IMPLANT
SUT ETHILON 2 0 PSLX (SUTURE) ×1 IMPLANT
SWAB COLLECTION DEVICE MRSA (MISCELLANEOUS) ×1 IMPLANT
SWAB CULTURE ESWAB REG 1ML (MISCELLANEOUS) IMPLANT
TOWEL GREEN STERILE (TOWEL DISPOSABLE) ×1 IMPLANT
TUBE CONNECTING 12X1/4 (SUCTIONS) ×1 IMPLANT
YANKAUER SUCT BULB TIP NO VENT (SUCTIONS) ×1 IMPLANT

## 2024-04-08 NOTE — Interval H&P Note (Signed)
 History and Physical Interval Note:  04/08/2024 6:40 AM  [name]  has presented today for surgery, with the diagnosis of Ulcers Bilateral Ankles.  The various methods of treatment have been discussed with the patient and family. After consideration of risks, benefits and other options for treatment, the patient has consented to  Procedure(s) with comments: INCISION AND DRAINAGE OF DEEP ABSCESS, CALF (Bilateral) - BILATERAL LEG DEBRIDEMENT APPLICATION, WOUND VAC (Bilateral) as a surgical intervention.  The patient's history has been reviewed, patient examined, no change in status, stable for surgery.  I have reviewed the patient's chart and labs.  Questions were answered to the patient's satisfaction.     [name] V Aasha Dina

## 2024-04-08 NOTE — Plan of Care (Signed)

## 2024-04-08 NOTE — Anesthesia Procedure Notes (Signed)
 Procedure Name: LMA Insertion Date/Time: 04/08/2024 2:46 PM  Performed by: Virgil Ee, CRNAPre-anesthesia Checklist: Patient identified, Patient being monitored, Timeout performed, Emergency Drugs available and Suction available Patient Re-evaluated:Patient Re-evaluated prior to induction Oxygen Delivery Method: Circle system utilized Preoxygenation: Pre-oxygenation with 100% oxygen Induction Type: IV induction Ventilation: Mask ventilation without difficulty LMA: LMA inserted LMA Size: 4.0 Tube type: Oral Number of attempts: 1 Placement Confirmation: positive ETCO2 and breath sounds checked- equal and bilateral Tube secured with: Tape Dental Injury: Teeth and Oropharynx as per pre-operative assessment

## 2024-04-08 NOTE — Transfer of Care (Signed)
 Immediate Anesthesia Transfer of Care Note  Patient: [name]  Procedure(s) Performed: INCISION AND DRAINAGE OF DEEP ABSCESS, CALF (Bilateral) APPLICATION, WOUND VAC (Bilateral)  Patient Location: PACU  Anesthesia Type:MAC   Level of Consciousness: awake  Airway & Oxygen Therapy: Patient Spontanous Breathing  Post-op Assessment: Report given to RN and Post -op Vital signs reviewed and stable  Post vital signs: Reviewed and stable  Last Vitals:  Vitals Value Taken Time  BP 147/87 04/08/24 15:30  Temp    Pulse 72 04/08/24 15:33  Resp 15 04/08/24 15:33  SpO2 97 % 04/08/24 15:33  Vitals shown include unfiled device data.  Last Pain:  Vitals:   04/08/24 1321  TempSrc:   PainSc: 0-No pain      Patients Stated Pain Goal: 2 (04/05/24 2104)  Complications: No notable events documented.

## 2024-04-08 NOTE — Progress Notes (Signed)
 Pt wants to speak with the MD before she will sign a consent. Will continue to monitor.

## 2024-04-08 NOTE — Anesthesia Postprocedure Evaluation (Signed)
 Anesthesia Post Note  Patient: [name]  Procedure(s) Performed: INCISION AND DRAINAGE OF DEEP ABSCESS, CALF (Bilateral) APPLICATION, WOUND VAC (Bilateral)     Patient location during evaluation: PACU Anesthesia Type: General Level of consciousness: awake and alert Pain management: pain level controlled Vital Signs Assessment: post-procedure vital signs reviewed and stable Respiratory status: spontaneous breathing, nonlabored ventilation, respiratory function stable and patient connected to nasal cannula oxygen Cardiovascular status: stable and blood pressure returned to baseline Postop Assessment: no apparent nausea or vomiting Anesthetic complications: no   No notable events documented.  Last Vitals:  Vitals:   04/08/24 1310 04/08/24 1530  BP: 129/70 (!) 147/87  Pulse: 67 73  Resp: 18 14  Temp: 36.8 C 36.8 C  SpO2: 98% 97%    Last Pain:  Vitals:   04/08/24 1543  TempSrc:   PainSc: 8     LLE Motor Response: Purposeful movement (04/08/24 1530) LLE Sensation: Full sensation (04/08/24 1530) RLE Motor Response: Purposeful movement (04/08/24 1530) RLE Sensation: Full sensation (04/08/24 1530)      Rome Ade

## 2024-04-08 NOTE — Anesthesia Preprocedure Evaluation (Signed)
 Anesthesia Evaluation  Patient identified by MRN, date of birth, ID band Patient awake    Reviewed: Allergy & Precautions, NPO status , Patient's Chart, lab work & pertinent test results  History of Anesthesia Complications Negative for: history of anesthetic complications  Airway Mallampati: II  TM Distance: >3 FB Neck ROM: Full    Dental no notable dental hx. (+) Teeth Intact, Dental Advisory Given   Pulmonary asthma , neg sleep apnea, neg COPD, Patient abstained from smoking.Not current smoker   Pulmonary exam normal breath sounds clear to auscultation       Cardiovascular Exercise Tolerance: Good METShypertension, Pt. on medications + Peripheral Vascular Disease  (-) CAD and (-) Past MI Normal cardiovascular exam(-) dysrhythmias  Rhythm:Regular Rate:Normal  06/25/2022 TTE  1. Left ventricular ejection fraction, by estimation, is 65 to 70%. The  left ventricle has normal function. The left ventricle has no regional  wall motion abnormalities. There is moderate left ventricular hypertrophy.  Left ventricular diastolic  parameters are consistent with Grade I diastolic dysfunction (impaired  relaxation).   2. Right ventricular systolic function is normal. The right ventricular  size is normal. There is normal pulmonary artery systolic pressure. The  estimated right ventricular systolic pressure is 24.3 mmHg.   3. The mitral valve is normal in structure. Trivial mitral valve  regurgitation. No evidence of mitral stenosis.   4. The aortic valve was not well visualized. There is mild calcification  of the aortic valve. Aortic valve regurgitation is not visualized. No  aortic stenosis is present.   5. Increased flow velocities may be secondary to anemia, thyrotoxicosis,  hyperdynamic or high flow state.   6. The inferior vena cava is normal in size with greater than 50%  respiratory variability, suggesting right atrial pressure of  3 mmHg.   7. Cannot exclude a small PFO.     Neuro/Psych  Headaches, Seizures -,  PSYCHIATRIC DISORDERS Anxiety Depression Bipolar Disorder  Dementia TIA Neuromuscular disease CVA, Residual Symptoms    GI/Hepatic Neg liver ROS, hiatal hernia,neg GERD  ,,  Endo/Other  diabetes, Well Controlled, Type 2    Renal/GU negative Renal ROS      Musculoskeletal  (+) Arthritis ,    Abdominal   Peds  Hematology Lab Results      Component                Value               Date                      WBC                      9.2                 10/22/2022                HGB                      12.5                10/22/2022                HCT                      39.2                10/22/2022  MCV                      84.7                10/22/2022                PLT                      406 (H)             10/22/2022              Anesthesia Other Findings Past Medical History: No date: Anxiety No date: Arthritis     Comment:  back, arms, legs (03/24/2016) No date: Asthma No date: Chronic lower back pain No date: Colonic polyp     Comment:  last colonoscopy done in 2009 with normal results per               medical record No date: Depressive disorder No date: Gastric polyp No date: Gout     Comment:  has taken allopurinol  300mg  once daily in past No date: Headache No date: History of hiatal hernia No date: Hypertension No date: Migraine     Comment:  none in awhile; might have a couple/year (03/24/2016) No date: Mixed hyperlipidemia     Comment:  01/2016 Total chol 141, HDL 59, LDL 63, ration 1.1 No date: Osteoarthritis 11/2014: TIA (transient ischemic attack) No date: Type II diabetes mellitus (HCC)  Reproductive/Obstetrics                              Anesthesia Physical Anesthesia Plan  ASA: 3  Anesthesia Plan: MAC   Post-op Pain Management: Minimal or no pain anticipated   Induction: Intravenous  PONV Risk Score and  Plan: 1 and Propofol  infusion, TIVA, Ondansetron  and Treatment may vary due to age or medical condition  Airway Management Planned: Nasal Cannula  Additional Equipment: None  Intra-op Plan:   Post-operative Plan:   Informed Consent: I have reviewed the patients History and Physical, chart, labs and discussed the procedure including the risks, benefits and alternatives for the proposed anesthesia with the patient or authorized representative who has indicated his/her understanding and acceptance.     Dental advisory given  Plan Discussed with: CRNA and Surgeon  Anesthesia Plan Comments: (Discussed risks of anesthesia with patient, including possibility of difficulty with spontaneous ventilation under anesthesia necessitating airway intervention, PONV, and rare risks such as cardiac or respiratory or neurological events, and allergic reactions. Discussed the role of CRNA in patient's perioperative care. Patient understands.)         Anesthesia Quick Evaluation

## 2024-04-08 NOTE — Op Note (Signed)
 04/08/2024  3:28 PM  PATIENT:  [name]    PRE-OPERATIVE DIAGNOSIS:  Ulcers Bilateral Ankles  POST-OPERATIVE DIAGNOSIS:  Same  PROCEDURE: Excisional debridement right ankle with excision of skin and soft tissue muscle and fascia. Excisional debridement dorsum of the left foot with excision of skin soft tissue and fascia. Application of Kerecis micro graft 95 cm to cover a total wound surface area of 200 cm. Application of cleanse choice wound VAC sponge to both lower extremities and this is covered with a peel and place wound VAC sponge x 2 Tissue sent from both lower extremities for cultures.  SURGEON:  Jerona LULLA Sage, MD  PHYSICIAN ASSISTANT:None ANESTHESIA:   General  PREOPERATIVE INDICATIONS:  [name] is a  73 y.o. female with a diagnosis of Ulcers Bilateral Ankles who failed conservative measures and elected for surgical management.    The risks benefits and alternatives were discussed with the patient preoperatively including but not limited to the risks of infection, bleeding, nerve injury, cardiopulmonary complications, the need for revision surgery, among others, and the patient was willing to proceed.  OPERATIVE IMPLANTS:   Implant Name Type Inv. Item Serial No. Manufacturer Lot No. LRB No. Used Action  GRAFT SKIN WND SURGICLOSE M95 - [id number] Tissue GRAFT SKIN WND SURGICLOSE M95  [employer] [id number] Bilateral 1 Implanted    @ENCIMAGES @  OPERATIVE FINDINGS: Tissue margins were clear after debridement with good petechial bleeding.  Tissue was sent for cultures.  OPERATIVE PROCEDURE: Patient was brought the operating room and underwent a general anesthetic.  After adequate levels anesthesia obtained both lower extremities were prepped using DuraPrep draped into a sterile field a timeout was called.  Attention was first focused on the right lower extremity.  A 21 blade knife was used to excise skin soft tissue muscle and fascia back to healthy bleeding  viable granulation tissue.  The wound was irrigated with Vashe.  Wound measures 10 x 10 cm after debridement.  A cleanse choice wound VAC sponge was then covered with Kerecis micro graft and this was applied to the wound covered with a peel in place VAC this had a good suction fit attention was then focused on the left lower extremity.  A 21 blade knife was used to excise skin soft tissue and fascia from the dorsum of the left foot.  After debridement the ulcer is 10 x 10 cm.  The wound was irrigated with Vashe.  Kerecis micro graft was applied covered with a cleanse choice sponge and a peel in place sponge.  This had a good suction fit patient was extubated taken the PACU in stable condition   DISCHARGE PLANNING:  Antibiotic duration: Continue antibiotics for 24 hours  Weightbearing: Weightbearing as tolerated bilateral ankle  Pain medication: Opioid pathway  Dressing care/ Wound VAC: Continue wound vacs both lower extremities for a week  Ambulatory devices: Walker  Discharge to: Anticipate discharge planning based on therapy recommendations  Follow-up: In the office 1 week post operative.

## 2024-04-08 NOTE — Progress Notes (Signed)
 PROGRESS NOTE  [name] FMW:[id number] DOB: Aug 27, 1950 DOA: 04/04/2024 PCP: [facility]   LOS: 0 days   Brief Narrative / Interim history: 73 year old female with IDDM, HTN, HLD, prior CVA, seizure disorder, bipolar, chronic venous stasis ulcer who was sent to the hospital by PCP for her chronic wounds.  She was seen by orthopedic surgery about a year ago and is status post debridement.  She has a home health RN visits about once a week now, and she has been unable to manage her wounds at home to well.  She was admitted to the hospital, imaging did not show any evidence of osteomyelitis, orthopedic surgery was consulted with plans in place for surgical debridement on 10/24  Subjective / 24h Interval events: She is doing well this morning, awaiting surgery.  Assesement and Plan: Principal problem Chronic venous stasis ulcers, leg wounds, lymphedema-Main reason for her hospitalization, she was sent by PCP.  X-rays revealed soft tissue swelling but no osteomyelitis, no significant cellulitis. - Continue pain control - ABIs unremarkable bilaterally - Orthopedic surgery consulted, plans to take to the OR this morning.  She has multiple questions for Dr. 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  CBGs reviewed and are acceptable  Lab Results  Component Value Date   HGBA1C 6.6 (H) 04/06/2024   CBG (last 3)  Recent Labs    04/07/24 1710 04/07/24 2051 04/08/24 0808  GLUCAP 118* 176* 113*    Scheduled Meds:  acetaminophen   1,000 mg Oral TID   allopurinol   100 mg Oral Daily   aspirin  EC  81 mg Oral Daily   atenolol   25 mg  Oral Daily   atorvastatin   20 mg Oral QHS   chlorhexidine   15 mL Mouth/Throat Once   Or   mouth rinse  15 mL Mouth Rinse Once   colchicine   0.6 mg Oral Daily   cyclobenzaprine   5 mg Oral Daily   enoxaparin  (LOVENOX ) injection  40 mg Subcutaneous Q24H   feeding supplement  237 mL Oral BID BM   gabapentin   300 mg Oral TID   insulin  aspart  0-15 Units Subcutaneous TID WC   mirtazapine   7.5 mg Oral QHS   NIFEdipine   90 mg Oral Daily   povidone-iodine   2 Application Topical Once   sertraline  50 mg Oral Daily   Continuous Infusions:   ceFAZolin  (ANCEF ) IV     lactated ringers      PRN Meds:.bisacodyl , ondansetron  **OR** ondansetron  (ZOFRAN ) IV, oxyCODONE , senna-docusate  Current Outpatient Medications  Medication Instructions   allopurinol  (ZYLOPRIM ) 100 mg, Oral, Daily   aspirin  EC 81 mg, Daily   atenolol  (TENORMIN ) 25 MG tablet Daily   atorvastatin  (LIPITOR) 20 mg, Oral, Daily at bedtime   cephALEXin  (KEFLEX ) 500 mg, Oral, 4 times daily   colchicine  0.6 mg, Daily   cyclobenzaprine  (FLEXERIL ) 5 mg, Daily PRN   donepezil  (ARICEPT ) 10 mg, Oral, Daily at bedtime   gabapentin  (NEURONTIN ) 300 mg, Daily   HYDROcodone -acetaminophen  (NORCO) 10-325 MG tablet 1 tablet, Oral, 2 times daily PRN   levETIRAcetam  (KEPPRA ) 750 mg, Oral, 2 times daily   mirtazapine  (REMERON ) 7.5 mg, Daily at bedtime   NIFEdipine  (ADALAT  CC) 90 mg, Daily   pentoxifylline  (TRENTAL ) 400 mg, Oral, 3 times daily  with meals   sertraline (ZOLOFT) 50 mg, Oral, Daily   SYMBICORT 80-4.5 MCG/ACT inhaler 2 puffs   Tresiba  FlexTouch 30 Units, Subcutaneous, 2 times daily    Diet Orders (From admission, onward)     Start     Ordered   04/08/24 0430  Diet NPO time specified  Diet effective ____        04/07/24 1818            DVT prophylaxis: enoxaparin  (LOVENOX ) injection 40 mg Start: 03/29/24 1000   Lab Results  Component Value Date   PLT 350 04/07/2024      Code Status: Full Code  Family Communication:  No family at bedside  Status is: Observation The patient will require care spanning > 2 midnights and should be moved to inpatient because: Surgery on Friday   Level of care: Med-Surg  Consultants:  Orthopedic surgery  Objective: Vitals:   04/07/24 0935 04/07/24 1818 04/07/24 2051 04/08/24 0448  BP: 131/78 134/78 126/68 127/74  Pulse: 95 71 83 76  Resp: 18 18  20   Temp: 98.1 F (36.7 C) 98.9 F (37.2 C) 97.9 F (36.6 C) 98.2 F (36.8 C)  TempSrc: Oral  Oral Oral  SpO2: 100% 100% 100% 100%  Weight:      Height:       No intake or output data in the 24 hours ending 04/08/24 1033  Wt Readings from Last 3 Encounters:  04/04/24 91.6 kg  03/05/24 91.6 kg  01/13/24 97.1 kg    Examination:  Constitutional: NAD Eyes: lids and conjunctivae normal, no scleral icterus ENMT: mmm Neck: normal, supple Respiratory: clear to auscultation bilaterally, no wheezing, no crackles.  Cardiovascular: Regular rate and rhythm, no murmurs / rubs / gallops. No LE edema. Abdomen: soft, no distention, no tenderness. Bowel sounds positive.   Data Reviewed: I have independently reviewed following labs and imaging studies   CBC Recent Labs  Lab 04/04/24 1639 04/05/24 0501 04/07/24 0300  WBC 9.1 6.3 5.7  HGB 12.9 10.5* 10.7*  HCT 43.1 34.7* 35.0*  PLT 451* 349 350  MCV 87.8 87.2 85.8  MCH 26.3 26.4 26.2  MCHC 29.9* 30.3 30.6  RDW 16.8* 17.0* 17.2*  LYMPHSABS 1.8  --   --   MONOABS 0.5  --   --   EOSABS 0.2  --   --   BASOSABS 0.0  --   --     Recent Labs  Lab 04/04/24 1639 04/04/24 1644 04/04/24 2004 04/05/24 0501 04/06/24 0347 04/07/24 0300  NA 139  --   --  139  --  139  K 3.5  --   --  3.7  --  4.2  CL 103  --   --  107  --  107  CO2 22  --   --  25  --  23  GLUCOSE 107*  --   --  127*  --  115*  BUN 8  --   --  7*  --  21  CREATININE 0.89  --   --  0.77  --  0.96  CALCIUM  9.7  --   --  9.2  --  9.0  AST 17  --   --   --   --  11*  ALT 13  --   --   --   --  11   ALKPHOS 113  --   --   --   --  88  BILITOT 0.5  --   --   --   --  0.4  ALBUMIN 3.3*  --   --   --   --  2.3*  MG  --   --   --   --   --  1.9  LATICACIDVEN  --  3.1* 1.3  --   --   --   INR 1.1  --   --   --   --   --   HGBA1C  --   --   --   --  6.6*  --     ------------------------------------------------------------------------------------------------------------------ No results for input(s): CHOL, HDL, LDLCALC, TRIG, CHOLHDL, LDLDIRECT in the last 72 hours.  Lab Results  Component Value Date   HGBA1C 6.6 (H) 04/06/2024   ------------------------------------------------------------------------------------------------------------------ No results for input(s): TSH, T4TOTAL, T3FREE, THYROIDAB in the last 72 hours.  Invalid input(s): FREET3  Cardiac Enzymes No results for input(s): CKMB, TROPONINI, MYOGLOBIN in the last 168 hours.  Invalid input(s): CK ------------------------------------------------------------------------------------------------------------------ No results found for: BNP  CBG: Recent Labs  Lab 04/07/24 0806 04/07/24 1158 04/07/24 1710 04/07/24 2051 04/08/24 0808  GLUCAP 114* 162* 118* 176* 113*    Recent Results (from the past 240 hours)  Culture, blood (Routine x 2)     Status: None (Preliminary result)   Collection Time: 04/04/24  4:38 PM   Specimen: BLOOD  Result Value Ref Range Status   Specimen Description BLOOD SITE NOT SPECIFIED  Final   Special Requests   Final    BOTTLES DRAWN AEROBIC AND ANAEROBIC Blood Culture results may not be optimal due to an inadequate volume of blood received in culture bottles   Culture   Final    NO GROWTH 4 DAYS Performed at [facility], [address], [place], [place] [address]    Report Status PENDING  Incomplete  Culture, blood (Routine x 2)     Status: None (Preliminary result)   Collection Time: 04/04/24  7:29 PM   Specimen: BLOOD RIGHT ARM  Result Value  Ref Range Status   Specimen Description BLOOD RIGHT ARM  Final   Special Requests   Final    BOTTLES DRAWN AEROBIC AND ANAEROBIC Blood Culture results may not be optimal due to an inadequate volume of blood received in culture bottles   Culture   Final    NO GROWTH 4 DAYS Performed at [facility], [address] [place], [place] [address]    Report Status PENDING  Incomplete  Surgical PCR screen     Status: None   Collection Time: 04/08/24  4:50 AM   Specimen: Nasal Mucosa; Nasal Swab  Result Value Ref Range Status   MRSA, PCR NEGATIVE NEGATIVE Final   Staphylococcus aureus NEGATIVE NEGATIVE Final    Comment: (NOTE) The Xpert SA Assay (FDA approved for NASAL specimens in patients 47 years of age and older), is one component of a comprehensive surveillance program. It is not intended to diagnose infection nor to guide or monitor treatment. Performed at [facility], [address], [place], [place] [address]      Radiology Studies: No results found.    Nilda Fendt, MD, PhD [facility]  Between 7 am - 7 pm I am available, please contact me via Amion (for emergencies) or Securechat (non urgent messages)  Between 7 pm - 7 am I am not available, please contact night coverage MD/APP via Amion

## 2024-04-09 DIAGNOSIS — G40909 Epilepsy, unspecified, not intractable, without status epilepticus: Secondary | ICD-10-CM | POA: Diagnosis present

## 2024-04-09 DIAGNOSIS — F03A3 Unspecified dementia, mild, with mood disturbance: Secondary | ICD-10-CM | POA: Diagnosis present

## 2024-04-09 DIAGNOSIS — Z8249 Family history of ischemic heart disease and other diseases of the circulatory system: Secondary | ICD-10-CM | POA: Diagnosis not present

## 2024-04-09 DIAGNOSIS — E1140 Type 2 diabetes mellitus with diabetic neuropathy, unspecified: Secondary | ICD-10-CM | POA: Diagnosis present

## 2024-04-09 DIAGNOSIS — E1165 Type 2 diabetes mellitus with hyperglycemia: Secondary | ICD-10-CM | POA: Diagnosis present

## 2024-04-09 DIAGNOSIS — F03A4 Unspecified dementia, mild, with anxiety: Secondary | ICD-10-CM | POA: Diagnosis present

## 2024-04-09 DIAGNOSIS — I83013 Varicose veins of right lower extremity with ulcer of ankle: Secondary | ICD-10-CM | POA: Diagnosis not present

## 2024-04-09 DIAGNOSIS — L97319 Non-pressure chronic ulcer of right ankle with unspecified severity: Secondary | ICD-10-CM | POA: Diagnosis present

## 2024-04-09 DIAGNOSIS — I10 Essential (primary) hypertension: Secondary | ICD-10-CM | POA: Diagnosis present

## 2024-04-09 DIAGNOSIS — Z7951 Long term (current) use of inhaled steroids: Secondary | ICD-10-CM | POA: Diagnosis not present

## 2024-04-09 DIAGNOSIS — L97302 Non-pressure chronic ulcer of unspecified ankle with fat layer exposed: Secondary | ICD-10-CM | POA: Diagnosis not present

## 2024-04-09 DIAGNOSIS — F319 Bipolar disorder, unspecified: Secondary | ICD-10-CM | POA: Diagnosis present

## 2024-04-09 DIAGNOSIS — Z91040 Latex allergy status: Secondary | ICD-10-CM | POA: Diagnosis not present

## 2024-04-09 DIAGNOSIS — L97312 Non-pressure chronic ulcer of right ankle with fat layer exposed: Secondary | ICD-10-CM | POA: Diagnosis not present

## 2024-04-09 DIAGNOSIS — Z7982 Long term (current) use of aspirin: Secondary | ICD-10-CM | POA: Diagnosis not present

## 2024-04-09 DIAGNOSIS — M109 Gout, unspecified: Secondary | ICD-10-CM | POA: Diagnosis present

## 2024-04-09 DIAGNOSIS — Z794 Long term (current) use of insulin: Secondary | ICD-10-CM | POA: Diagnosis not present

## 2024-04-09 DIAGNOSIS — Z6834 Body mass index (BMI) 34.0-34.9, adult: Secondary | ICD-10-CM | POA: Diagnosis not present

## 2024-04-09 DIAGNOSIS — I83003 Varicose veins of unspecified lower extremity with ulcer of ankle: Secondary | ICD-10-CM | POA: Diagnosis not present

## 2024-04-09 DIAGNOSIS — Z79899 Other long term (current) drug therapy: Secondary | ICD-10-CM | POA: Diagnosis not present

## 2024-04-09 DIAGNOSIS — L98491 Non-pressure chronic ulcer of skin of other sites limited to breakdown of skin: Secondary | ICD-10-CM | POA: Diagnosis present

## 2024-04-09 DIAGNOSIS — J45909 Unspecified asthma, uncomplicated: Secondary | ICD-10-CM | POA: Diagnosis present

## 2024-04-09 DIAGNOSIS — Z888 Allergy status to other drugs, medicaments and biological substances status: Secondary | ICD-10-CM | POA: Diagnosis not present

## 2024-04-09 DIAGNOSIS — E782 Mixed hyperlipidemia: Secondary | ICD-10-CM | POA: Diagnosis present

## 2024-04-09 DIAGNOSIS — I872 Venous insufficiency (chronic) (peripheral): Secondary | ICD-10-CM | POA: Diagnosis present

## 2024-04-09 DIAGNOSIS — L97929 Non-pressure chronic ulcer of unspecified part of left lower leg with unspecified severity: Secondary | ICD-10-CM | POA: Diagnosis present

## 2024-04-09 DIAGNOSIS — E66811 Obesity, class 1: Secondary | ICD-10-CM | POA: Diagnosis present

## 2024-04-09 DIAGNOSIS — G8929 Other chronic pain: Secondary | ICD-10-CM | POA: Diagnosis present

## 2024-04-09 DIAGNOSIS — L97909 Non-pressure chronic ulcer of unspecified part of unspecified lower leg with unspecified severity: Secondary | ICD-10-CM | POA: Diagnosis present

## 2024-04-09 LAB — COMPREHENSIVE METABOLIC PANEL WITH GFR
ALT: 25 U/L (ref 0–44)
AST: 37 U/L (ref 15–41)
Albumin: 2.3 g/dL — ABNORMAL LOW (ref 3.5–5.0)
Alkaline Phosphatase: 67 U/L (ref 38–126)
Anion gap: 6 (ref 5–15)
BUN: 17 mg/dL (ref 8–23)
CO2: 25 mmol/L (ref 22–32)
Calcium: 8.6 mg/dL — ABNORMAL LOW (ref 8.9–10.3)
Chloride: 106 mmol/L (ref 98–111)
Creatinine, Ser: 0.87 mg/dL (ref 0.44–1.00)
GFR, Estimated: >60 mL/min (ref >60)
Glucose, Bld: 135 mg/dL — ABNORMAL HIGH (ref 70–99)
Potassium: 4.2 mmol/L (ref 3.5–5.1)
Sodium: 137 mmol/L (ref 135–145)
Total Bilirubin: 0.2 mg/dL (ref 0.0–1.2)
Total Protein: 7 g/dL (ref 6.5–8.1)

## 2024-04-09 LAB — CBC
HCT: 32 % — ABNORMAL LOW (ref 36.0–46.0)
Hemoglobin: 9.8 g/dL — ABNORMAL LOW (ref 12.0–15.0)
MCH: 26.6 pg (ref 26.0–34.0)
MCHC: 30.6 g/dL (ref 30.0–36.0)
MCV: 86.7 fL (ref 80.0–100.0)
Platelets: 318 K/uL (ref 150–400)
RBC: 3.69 MIL/uL — ABNORMAL LOW (ref 3.87–5.11)
RDW: 17.5 % — ABNORMAL HIGH (ref 11.5–15.5)
WBC: 5.7 K/uL (ref 4.0–10.5)
nRBC: 0 % (ref 0.0–0.2)

## 2024-04-09 LAB — CULTURE, BLOOD (ROUTINE X 2)
Culture: NO GROWTH
Culture: NO GROWTH

## 2024-04-09 LAB — GLUCOSE, CAPILLARY
Glucose-Capillary: 108 mg/dL — ABNORMAL HIGH (ref 70–99)
Glucose-Capillary: 133 mg/dL — ABNORMAL HIGH (ref 70–99)
Glucose-Capillary: 165 mg/dL — ABNORMAL HIGH (ref 70–99)
Glucose-Capillary: 204 mg/dL — ABNORMAL HIGH (ref 70–99)
Glucose-Capillary: 216 mg/dL — ABNORMAL HIGH (ref 70–99)

## 2024-04-09 LAB — MAGNESIUM: Magnesium: 1.9 mg/dL (ref 1.7–2.4)

## 2024-04-09 MED ORDER — VANCOMYCIN HCL 1750 MG/350ML IV SOLN
1750.0000 mg | Freq: Once | INTRAVENOUS | Status: AC
  Administered 2024-04-09: 1750 mg via INTRAVENOUS
  Filled 2024-04-09: qty 350

## 2024-04-09 MED ORDER — VANCOMYCIN HCL 1500 MG/300ML IV SOLN
1500.0000 mg | INTRAVENOUS | Status: DC
Start: 2024-04-10 — End: 2024-04-11
  Administered 2024-04-10: 1500 mg via INTRAVENOUS
  Filled 2024-04-09: qty 300

## 2024-04-09 MED ORDER — SODIUM CHLORIDE 0.9 % IV SOLN
2.0000 g | Freq: Three times a day (TID) | INTRAVENOUS | Status: DC
  Administered 2024-04-09 – 2024-04-11 (×6): 2 g via INTRAVENOUS
  Filled 2024-04-09 (×6): qty 12.5

## 2024-04-09 NOTE — Progress Notes (Signed)
 Patient ID: [name], female   DOB: September 21, 1950, 73 y.o.   MRN: [id number] Patient is postoperative day 1 irrigation debridement bilateral calfs.  There is approximately 50 cc in both wound VAC canisters.  Patient may be weightbearing as tolerated bilaterally.  Tissue cultures are showing gram-positive cocci and gram-negative rods in both wounds.  Anticipate discharge with oral antibiotics.

## 2024-04-09 NOTE — Progress Notes (Signed)
 Pharmacy Antibiotic Note  [name] is a 73 y.o. female admitted on 04/04/2024 with wound infection.  Pharmacy has been consulted for Vancomycin  and cefepime  dosing. Tissue cultures were sent and are growing GPC and GNR     Plan: Vancomycin  1750mg  IV x 1 Vancomycin  1500mg  IV q24h Est AUC 504, trough 12.5. Scr used 0.87 Cefepime  2gm IV q8h Vancomycin  levels as needed.  Monitor C&S for deescalation  Height: 5' 4 (162.6 cm) Weight: 91.6 kg (201 lb 15.1 oz) IBW/kg (Calculated) : 54.7  Temp (24hrs), Avg:98.1 F (36.7 C), Min:97.7 F (36.5 C), Max:98.7 F (37.1 C)  Recent Labs  Lab 04/04/24 1639 04/04/24 1644 04/04/24 2004 04/05/24 0501 04/07/24 0300 04/09/24 0151  WBC 9.1  --   --  6.3 5.7 5.7  CREATININE 0.89  --   --  0.77 0.96 0.87  LATICACIDVEN  --  3.1* 1.3  --   --   --     Estimated Creatinine Clearance: 62.2 mL/min (by C-G formula based on SCr of 0.87 mg/dL).    Allergies  Allergen Reactions   Ace Inhibitors Swelling   Latex Itching and Swelling   Ultram [Tramadol] Nausea And Vomiting   Zanaflex [Tizanidine] Other (See Comments)    Tremors    Diprivan  [Propofol ] Itching   Metformin And Related Other (See Comments)    Tremors  Chills    Antimicrobials this admission: 10/25 Vancomycin  >>  10/25 Cefepime  >>  Dose adjustments this admission: N/a  Microbiology results: 10/24 TCx: GNR, GPC   [name] A. Lyle, PharmD, BCPS, FNKF Clinical Pharmacist [place] Please utilize Amion for appropriate phone number to reach the unit pharmacist [facility])  04/09/2024 2:18 PM

## 2024-04-09 NOTE — Progress Notes (Signed)
 PROGRESS NOTE  [name] FMW:[id number] DOB: 20-Aug-1950 DOA: 04/04/2024 PCP: [facility] [address]   LOS: 0 days   Brief Narrative / Interim history: 73 year old female with IDDM, HTN, HLD, prior CVA, seizure disorder, bipolar, chronic venous stasis ulcer who was sent to the hospital by PCP for her chronic wounds.  She was seen by orthopedic surgery about a year ago and is status post debridement.  She has a home health RN visits about once a week now, and she has been unable to manage her wounds at home to well.  She was admitted to the hospital, imaging did not show any evidence of osteomyelitis, orthopedic surgery was consulted with plans in place for surgical debridement on 10/24  Subjective / 24h Interval events: No complaints today  Assesement and Plan: Principal problem Chronic venous stasis ulcers, leg wounds, lymphedema-Main reason for her hospitalization, she was sent by PCP.  X-rays revealed soft tissue swelling but no osteomyelitis, no significant cellulitis. - Continue pain control - ABIs unremarkable bilaterally - Orthopedic surgery consulted, she went to the OR 10/24 status post excisional debridement, skin excision with Kerecis micro graft.  Cultures were sent and are growing GPC and GNR.  Per Ortho she will need antibiotics on discharge.  Start IV now until cultures are finalized  Active problems Essential hypertension-continue atenolol , nifedipine , blood pressure is stable  Neuropathy-continue gabapentin   Hyperlipidemia-continue statin  Bipolar/anxiety/depression-continue Zoloft, Remeron   History of gout-continue allopurinol , colchicine   Obesity, class I-BMI 34  DM2-has been placed on sliding scale, continue.  CBGs reviewed and are acceptable  Lab Results  Component Value Date   HGBA1C 6.6 (H) 04/06/2024   CBG (last 3)  Recent Labs    04/09/24 0015 04/09/24 0825 04/09/24 1201  GLUCAP 204* 108* 165*    Scheduled Meds:  acetaminophen   1,000 mg Oral  TID   allopurinol   100 mg Oral Daily   aspirin  EC  81 mg Oral Daily   atenolol   25 mg Oral Daily   atorvastatin   20 mg Oral QHS   colchicine   0.6 mg Oral Daily   cyclobenzaprine   5 mg Oral Daily   enoxaparin  (LOVENOX ) injection  40 mg Subcutaneous Q24H   feeding supplement  237 mL Oral BID BM   gabapentin   300 mg Oral TID   insulin  aspart  0-15 Units Subcutaneous TID WC   mirtazapine   7.5 mg Oral QHS   NIFEdipine   90 mg Oral Daily   sertraline  50 mg Oral Daily   Continuous Infusions:   PRN Meds:.bisacodyl , HYDROmorphone  (DILAUDID ) injection, ondansetron  **OR** ondansetron  (ZOFRAN ) IV, oxyCODONE , senna-docusate  Current Outpatient Medications  Medication Instructions   allopurinol  (ZYLOPRIM ) 100 mg, Oral, Daily   aspirin  EC 81 mg, Daily   atenolol  (TENORMIN ) 25 MG tablet Daily   atorvastatin  (LIPITOR) 20 mg, Oral, Daily at bedtime   cephALEXin  (KEFLEX ) 500 mg, Oral, 4 times daily   colchicine  0.6 mg, Daily   cyclobenzaprine  (FLEXERIL ) 5 mg, Daily PRN   donepezil  (ARICEPT ) 10 mg, Oral, Daily at bedtime   gabapentin  (NEURONTIN ) 300 mg, Daily   HYDROcodone -acetaminophen  (NORCO) 10-325 MG tablet 1 tablet, Oral, 2 times daily PRN   levETIRAcetam  (KEPPRA ) 750 mg, Oral, 2 times daily   mirtazapine  (REMERON ) 7.5 mg, Daily at bedtime   NIFEdipine  (ADALAT  CC) 90 mg, Daily   pentoxifylline  (TRENTAL ) 400 mg, Oral, 3 times daily with meals   sertraline (ZOLOFT) 50 mg, Oral, Daily   SYMBICORT 80-4.5 MCG/ACT inhaler 2 puffs   Tresiba  FlexTouch 30  Units, Subcutaneous, 2 times daily    Diet Orders (From admission, onward)     Start     Ordered   04/08/24 1640  Diet Carb Modified Fluid consistency: Thin; Room service appropriate? Yes  Diet effective now       Question Answer Comment  Diet-HS Snack? Nothing   Calorie Level Medium 1600-2000   Fluid consistency: Thin   Room service appropriate? Yes      04/08/24 1639            DVT prophylaxis: enoxaparin  (LOVENOX ) injection 40  mg Start: 03/29/24 1000   Lab Results  Component Value Date   PLT 318 04/09/2024      Code Status: Full Code  Family Communication: No family at bedside  Status is: Observation The patient will require care spanning > 2 midnights and should be moved to inpatient because: Surgery on Friday   Level of care: Med-Surg  Consultants:  Orthopedic surgery  Objective: Vitals:   04/08/24 1803 04/08/24 2149 04/09/24 0656 04/09/24 0943  BP: 123/67 122/67 (!) 140/70 132/61  Pulse: 70 68 64 68  Resp: 18 18 18 16   Temp:   97.8 F (36.6 C) 97.7 F (36.5 C)  TempSrc:   Oral   SpO2: 100% 100% 100% 100%  Weight:      Height:        Intake/Output Summary (Last 24 hours) at 04/09/2024 1342 Last data filed at 04/09/2024 0900 Gross per 24 hour  Intake 1920 ml  Output 60 ml  Net 1860 ml    Wt Readings from Last 3 Encounters:  04/08/24 91.6 kg  03/05/24 91.6 kg  01/13/24 97.1 kg    Examination:  Constitutional: NAD Eyes: lids and conjunctivae normal, no scleral icterus ENMT: mmm Neck: normal, supple Respiratory: clear to auscultation bilaterally, no wheezing, no crackles. Cardiovascular: Regular rate and rhythm, no murmurs / rubs / gallops. No LE edema. Abdomen: soft, no distention, no tenderness. Bowel sounds positive.   Data Reviewed: I have independently reviewed following labs and imaging studies   CBC Recent Labs  Lab 04/04/24 1639 04/05/24 0501 04/07/24 0300 04/09/24 0151  WBC 9.1 6.3 5.7 5.7  HGB 12.9 10.5* 10.7* 9.8*  HCT 43.1 34.7* 35.0* 32.0*  PLT 451* 349 350 318  MCV 87.8 87.2 85.8 86.7  MCH 26.3 26.4 26.2 26.6  MCHC 29.9* 30.3 30.6 30.6  RDW 16.8* 17.0* 17.2* 17.5*  LYMPHSABS 1.8  --   --   --   MONOABS 0.5  --   --   --   EOSABS 0.2  --   --   --   BASOSABS 0.0  --   --   --     Recent Labs  Lab 04/04/24 1639 04/04/24 1644 04/04/24 2004 04/05/24 0501 04/06/24 0347 04/07/24 0300 04/09/24 0151  NA 139  --   --  139  --  139 137  K 3.5   --   --  3.7  --  4.2 4.2  CL 103  --   --  107  --  107 106  CO2 22  --   --  25  --  23 25  GLUCOSE 107*  --   --  127*  --  115* 135*  BUN 8  --   --  7*  --  21 17  CREATININE 0.89  --   --  0.77  --  0.96 0.87  CALCIUM  9.7  --   --  9.2  --  9.0 8.6*  AST 17  --   --   --   --  11* 37  ALT 13  --   --   --   --  11 25  ALKPHOS 113  --   --   --   --  88 67  BILITOT 0.5  --   --   --   --  0.4 0.2  ALBUMIN 3.3*  --   --   --   --  2.3* 2.3*  MG  --   --   --   --   --  1.9 1.9  LATICACIDVEN  --  3.1* 1.3  --   --   --   --   INR 1.1  --   --   --   --   --   --   HGBA1C  --   --   --   --  6.6*  --   --     ------------------------------------------------------------------------------------------------------------------ No results for input(s): CHOL, HDL, LDLCALC, TRIG, CHOLHDL, LDLDIRECT in the last 72 hours.  Lab Results  Component Value Date   HGBA1C 6.6 (H) 04/06/2024   ------------------------------------------------------------------------------------------------------------------ No results for input(s): TSH, T4TOTAL, T3FREE, THYROIDAB in the last 72 hours.  Invalid input(s): FREET3  Cardiac Enzymes No results for input(s): CKMB, TROPONINI, MYOGLOBIN in the last 168 hours.  Invalid input(s): CK ------------------------------------------------------------------------------------------------------------------ No results found for: BNP  CBG: Recent Labs  Lab 04/08/24 1217 04/08/24 1530 04/09/24 0015 04/09/24 0825 04/09/24 1201  GLUCAP 91 95 204* 108* 165*    Recent Results (from the past 240 hours)  Culture, blood (Routine x 2)     Status: None   Collection Time: 04/04/24  4:38 PM   Specimen: BLOOD  Result Value Ref Range Status   Specimen Description BLOOD SITE NOT SPECIFIED  Final   Special Requests   Final    BOTTLES DRAWN AEROBIC AND ANAEROBIC Blood Culture results may not be optimal due to an inadequate volume of  blood received in culture bottles   Culture   Final    NO GROWTH 5 DAYS Performed at [facility], [address] [place], [place] [address]    Report Status 04/09/2024 FINAL  Final  Culture, blood (Routine x 2)     Status: None   Collection Time: 04/04/24  7:29 PM   Specimen: BLOOD RIGHT ARM  Result Value Ref Range Status   Specimen Description BLOOD RIGHT ARM  Final   Special Requests   Final    BOTTLES DRAWN AEROBIC AND ANAEROBIC Blood Culture results may not be optimal due to an inadequate volume of blood received in culture bottles   Culture   Final    NO GROWTH 5 DAYS Performed at [facility], [address], [place], [place] [address]    Report Status 04/09/2024 FINAL  Final  Surgical PCR screen     Status: None   Collection Time: 04/08/24  4:50 AM   Specimen: Nasal Mucosa; Nasal Swab  Result Value Ref Range Status   MRSA, PCR NEGATIVE NEGATIVE Final   Staphylococcus aureus NEGATIVE NEGATIVE Final    Comment: (NOTE) The Xpert SA Assay (FDA approved for NASAL specimens in patients 46 years of age and older), is one component of a comprehensive surveillance program. It is not intended to diagnose infection nor to guide or monitor treatment. Performed at [facility], [address], [place], [place] [address]   Aerobic/Anaerobic Culture  w Gram Stain (surgical/deep wound)     Status: None (Preliminary result)   Collection Time: 04/08/24  3:12 PM   Specimen: Wound; Tissue  Result Value Ref Range Status   Specimen Description TISSUE RIGHT FOOT  Final   Special Requests NONE  Final   Gram Stain   Final    FEW WBC PRESENT,BOTH PMN AND MONONUCLEAR MODERATE GRAM POSITIVE RODS RARE GRAM POSITIVE COCCI IN PAIRS RARE GRAM NEGATIVE RODS    Culture   Final    CULTURE REINCUBATED FOR BETTER GROWTH Performed at [facility], [address] [place], [place] [address]    Report Status PENDING  Incomplete  Aerobic/Anaerobic Culture w Gram Stain  (surgical/deep wound)     Status: None (Preliminary result)   Collection Time: 04/08/24  3:17 PM   Specimen: Wound; Tissue  Result Value Ref Range Status   Specimen Description TISSUE LEFT FOOT  Final   Special Requests NONE  Final   Gram Stain   Final    FEW WBC PRESENT,BOTH PMN AND MONONUCLEAR MODERATE GRAM POSITIVE RODS RARE GRAM POSITIVE COCCI IN PAIRS RARE GRAM NEGATIVE RODS    Culture   Final    TOO YOUNG TO READ Performed at [facility], [address], [place], [place] [address]    Report Status PENDING  Incomplete     Radiology Studies: No results found.    Nilda Fendt, MD, PhD [facility]  Between 7 am - 7 pm I am available, please contact me via Amion (for emergencies) or Securechat (non urgent messages)  Between 7 pm - 7 am I am not available, please contact night coverage MD/APP via Amion

## 2024-04-09 NOTE — Plan of Care (Signed)
  Problem: Education: Goal: Ability to describe self-care measures that may prevent or decrease complications (Diabetes Survival Skills Education) will improve Outcome: Progressing Goal: Individualized Educational Video(s) Outcome: Progressing   Problem: Fluid Volume: Goal: Ability to maintain a balanced intake and output will improve Outcome: Progressing   Problem: Metabolic: Goal: Ability to maintain appropriate glucose levels will improve Outcome: Progressing   Problem: Skin Integrity: Goal: Risk for impaired skin integrity will decrease Outcome: Progressing   Problem: Activity: Goal: Risk for activity intolerance will decrease Outcome: Progressing   Problem: Nutrition: Goal: Adequate nutrition will be maintained Outcome: Progressing

## 2024-04-09 NOTE — Plan of Care (Signed)
  Problem: Coping: Goal: Ability to adjust to condition or change in health will improve Outcome: Progressing   Problem: Fluid Volume: Goal: Ability to maintain a balanced intake and output will improve Outcome: Progressing   Problem: Health Behavior/Discharge Planning: Goal: Ability to manage health-related needs will improve Outcome: Progressing   Problem: Metabolic: Goal: Ability to maintain appropriate glucose levels will improve Outcome: Progressing   Problem: Nutritional: Goal: Maintenance of adequate nutrition will improve Outcome: Progressing   Problem: Skin Integrity: Goal: Risk for impaired skin integrity will decrease Outcome: Progressing   Problem: Tissue Perfusion: Goal: Adequacy of tissue perfusion will improve Outcome: Progressing   Problem: Education: Goal: Knowledge of General Education information will improve Description: Including pain rating scale, medication(s)/side effects and non-pharmacologic comfort measures Outcome: Progressing   Problem: Health Behavior/Discharge Planning: Goal: Ability to manage health-related needs will improve Outcome: Progressing   Problem: Clinical Measurements: Goal: Will remain free from infection Outcome: Progressing Goal: Diagnostic test results will improve Outcome: Progressing Goal: Respiratory complications will improve Outcome: Progressing Goal: Cardiovascular complication will be avoided Outcome: Progressing   Problem: Activity: Goal: Risk for activity intolerance will decrease Outcome: Progressing   Problem: Nutrition: Goal: Adequate nutrition will be maintained Outcome: Progressing   Problem: Coping: Goal: Level of anxiety will decrease Outcome: Progressing   Problem: Elimination: Goal: Will not experience complications related to bowel motility Outcome: Progressing Goal: Will not experience complications related to urinary retention Outcome: Progressing

## 2024-04-10 DIAGNOSIS — L97302 Non-pressure chronic ulcer of unspecified ankle with fat layer exposed: Secondary | ICD-10-CM | POA: Diagnosis not present

## 2024-04-10 DIAGNOSIS — I83003 Varicose veins of unspecified lower extremity with ulcer of ankle: Secondary | ICD-10-CM | POA: Diagnosis not present

## 2024-04-10 LAB — GLUCOSE, CAPILLARY
Glucose-Capillary: 158 mg/dL — ABNORMAL HIGH (ref 70–99)
Glucose-Capillary: 196 mg/dL — ABNORMAL HIGH (ref 70–99)
Glucose-Capillary: 174 mg/dL — ABNORMAL HIGH (ref 70–99)
Glucose-Capillary: 186 mg/dL — ABNORMAL HIGH (ref 70–99)
Glucose-Capillary: 175 mg/dL — ABNORMAL HIGH (ref 70–99)

## 2024-04-10 NOTE — Evaluation (Signed)
 Physical Therapy Evaluation Patient Details Name: [name] MRN: [id number] DOB: 11-Jan-1951 Today's Date: 04/10/2024  History of Present Illness  [name] is a 73 y.o. female admitted 04/04/24 for chronic venous stasis ulcer. Pt s/p excisional debridement of R ankle and L foot with wound vac application 10/24. PMHx: HTN, CVA, OA, gout, HLD, TIA, T2DM, chronic LBP, PAD, and migraines.   Clinical Impression  Pt admitted with above diagnosis. PTA, pt was modI for functional mobility using a power w/c and transferring via stand pivots. She is modI for ADLs and most IADLs. She lives with her husband in a handicap accessible apartment with a level entry. Pt currently with functional limitations due to the deficits listed below (see PT Problem List). She required CGA for sit<>stand and bed<>chair transfer using RW. Assist to manage lines/leads. Pt will benefit from acute skilled PT to increase her independence and safety with mobility to allow discharge. Recommend HHPT to increase strength, improve balance, decrease fall risk, advance activity tolerance, and optimize safety within the home environment.     If plan is discharge home, recommend the following: A little help with walking and/or transfers;A little help with bathing/dressing/bathroom;Assistance with cooking/housework;Assist for transportation;Help with stairs or ramp for entrance   Can travel by private vehicle        Equipment Recommendations None recommended by PT  Recommendations for Other Services       Functional Status Assessment Patient has had a recent decline in their functional status and demonstrates the ability to make significant improvements in function in a reasonable and predictable amount of time.     Precautions / Restrictions Precautions Precautions: Fall Recall of Precautions/Restrictions: Intact Precaution/Restrictions Comments: Wound Vac BLE Restrictions Weight Bearing Restrictions Per Provider  Order: Yes RLE Weight Bearing Per Provider Order: Weight bearing as tolerated LLE Weight Bearing Per Provider Order: Weight bearing as tolerated      Mobility  Bed Mobility               General bed mobility comments: Not assessed. Pt greeted seated EOB and returned there at end of session.    Transfers Overall transfer level: Needs assistance Equipment used: Rolling walker (2 wheels) Transfers: Sit to/from Stand, Bed to chair/wheelchair/BSC Sit to Stand: Contact guard assist Stand pivot transfers: Contact guard assist         General transfer comment: Pt stood from lowest bed height and recliner chair. She pushed up with BUE support. Slowly transitioned BUE onto RW grips and increased upright posture. Transferred to/from recliner chair. Good eccentric control. Assist to manage lines/leads.    Ambulation/Gait Ambulation/Gait assistance: Contact guard assist Gait Distance (Feet): 5 Feet Assistive device: Rolling walker (2 wheels) Gait Pattern/deviations: Step-to pattern, Decreased step length - right, Decreased step length - left, Decreased stride length, Trunk flexed Gait velocity: decreased     General Gait Details: Pt ambulated with short slow steps and heavy reliance on BUE support on RW. Pt had minimal foot clearance and maintained fwd lean. Assist to manage lines/leads.  Stairs            Wheelchair Mobility     Tilt Bed    Modified Rankin (Stroke Patients Only)       Balance Overall balance assessment: Mild deficits observed, not formally tested  Pertinent Vitals/Pain Pain Assessment Pain Assessment: 0-10 Pain Score: 5  Pain Location: B feet Pain Descriptors / Indicators: Aching, Discomfort, Sore Pain Intervention(s): Monitored during session, Limited activity within patient's tolerance, Repositioned    Home Living Family/patient expects to be discharged to:: Private  residence Living Arrangements: Spouse/significant other Available Help at Discharge: Family;Available 24 hours/day Type of Home: Apartment Home Access: Level entry       Home Layout: One level Home Equipment: Wheelchair - power;Wheelchair - manual;Transport chair;Hospital bed;Grab bars - toilet;Grab bars - tub/shower;Hand held [employer]/support/administrative Officer (2 wheels);Lift chair;Other (comment) (trapeze bar)      Prior Function Prior Level of Function : Independent/Modified Independent             Mobility Comments: Mobilizes in power chair. Transfers by stand pivot. Denies fall hx. ADLs Comments: ModI with ADLs, takes increased time. Occasionally needs her husbands help. Doesn't drive. Husband helps with medication management.     Extremity/Trunk Assessment   Upper Extremity Assessment Upper Extremity Assessment: Overall WFL for tasks assessed;Right hand dominant    Lower Extremity Assessment Lower Extremity Assessment: RLE deficits/detail;LLE deficits/detail RLE Deficits / Details: Pt POD 2 s/p ankle I&D Pt with wound vac applied and wrapping around ankle/foot. Decreased ankle/foot ROM and strength. Decreased hip AROM d/t body habitus. Grossly 3/5 strength. RLE Sensation: history of peripheral neuropathy RLE Coordination: decreased gross motor LLE Deficits / Details: Pt POD 2 s/p foot I&D. Pt with wound vac applied and wrapping around foot. Decreased ankle/foot ROM and strength. Decreased hip AROM d/t body habitus. Grossly 3/5 strength. LLE Sensation: history of peripheral neuropathy LLE Coordination: decreased gross motor    Cervical / Trunk Assessment Cervical / Trunk Assessment: Normal;Other exceptions Cervical / Trunk Exceptions: Increased Body Habitus  Communication   Communication Communication: No apparent difficulties    Cognition Arousal: Alert Behavior During Therapy: WFL for tasks assessed/performed   PT - Cognitive impairments: No apparent  impairments                       PT - Cognition Comments: Pt A,Ox4 Following commands: Intact       Cueing Cueing Techniques: Verbal cues     General Comments      Exercises     Assessment/Plan    PT Assessment Patient needs continued PT services  PT Problem List Decreased skin integrity;Decreased activity tolerance;Decreased mobility;Decreased strength;Obesity       PT Treatment Interventions DME instruction;Gait training;Functional mobility training;Therapeutic activities;Therapeutic exercise;Balance training;Patient/family education    PT Goals (Current goals can be found in the Care Plan section)  Acute Rehab PT Goals Patient Stated Goal: Return Home PT Goal Formulation: With patient Time For Goal Achievement: 04/24/24 Potential to Achieve Goals: Good    Frequency Min 1X/week     Co-evaluation               AM-PAC PT 6 Clicks Mobility  Outcome Measure Help needed turning from your back to your side while in a flat bed without using bedrails?: A Little Help needed moving from lying on your back to sitting on the side of a flat bed without using bedrails?: A Little Help needed moving to and from a bed to a chair (including a wheelchair)?: A Little Help needed standing up from a chair using your arms (e.g., wheelchair or bedside chair)?: A Little Help needed to walk in hospital room?: A Lot Help needed climbing 3-5 steps with a railing? : Total 6 Click Score:  15    End of Session Equipment Utilized During Treatment: Gait belt Activity Tolerance: Patient tolerated treatment well Patient left: in bed;with call bell/phone within reach;with bed alarm set Nurse Communication: Mobility status PT Visit Diagnosis: Unsteadiness on feet (R26.81);Difficulty in walking, not elsewhere classified (R26.2);Other abnormalities of gait and mobility (R26.89)    Time: 8460-8444 PT Time Calculation (min) (ACUTE ONLY): 16 min   Charges:   PT Evaluation $PT  Eval Low Complexity: 1 Low   PT General Charges $$ ACUTE PT VISIT: 1 Visit         Randall SAUNDERS, PT, DPT Acute Rehabilitation Services Office: [phone] Secure Chat Preferred  [name] 04/10/2024, 4:08 PM

## 2024-04-10 NOTE — Progress Notes (Signed)
 PROGRESS NOTE  [name] FMW:[id number] DOB: 1951-04-24 DOA: 04/04/2024 PCP: [facility]   LOS: 1 day   Brief Narrative / Interim history: 73 year old female with IDDM, HTN, HLD, prior CVA, seizure disorder, bipolar, chronic venous stasis ulcer who was sent to the hospital by PCP for her chronic wounds.  She was seen by orthopedic surgery about a year ago and is status post debridement.  She has a home health RN visits about once a week now, and she has been unable to manage her wounds at home to well.  She was admitted to the hospital, imaging did not show any evidence of osteomyelitis, orthopedic surgery was consulted with plans in place for surgical debridement on 10/24  Subjective / 24h Interval events: Complains of foot pain, bilateral.  Assesement and Plan: Principal problem Chronic venous stasis ulcers, leg wounds, lymphedema-Main reason for her hospitalization, she was sent by PCP.  X-rays revealed soft tissue swelling but no osteomyelitis, no significant cellulitis. - Continue pain control - ABIs unremarkable bilaterally - Orthopedic surgery consulted, she went to the OR 10/24 status post excisional debridement, skin excision with Kerecis micro graft.  Cultures were sent and are growing GPC and GNR.  Per Ortho she will need antibiotics on discharge.  Start IV now until cultures are finalized, cultures reintubated today for better growth, too young to read  Active problems Essential hypertension-continue atenolol , nifedipine , blood pressure stable  Neuropathy-continue gabapentin   Hyperlipidemia-continue statin  Bipolar/anxiety/depression-continue Zoloft, Remeron   History of gout-continue allopurinol , colchicine   Obesity, class I-BMI 34  DM2, with hyperglycemia-has been placed on sliding scale, continue.  CBGs overall acceptable  Lab Results  Component Value Date   HGBA1C 6.6 (H) 04/06/2024   CBG (last 3)  Recent Labs    04/09/24 1653 04/09/24 2100  04/10/24 0802  GLUCAP 133* 216* 158*    Scheduled Meds:  acetaminophen   1,000 mg Oral TID   allopurinol   100 mg Oral Daily   aspirin  EC  81 mg Oral Daily   atenolol   25 mg Oral Daily   atorvastatin   20 mg Oral QHS   colchicine   0.6 mg Oral Daily   cyclobenzaprine   5 mg Oral Daily   enoxaparin  (LOVENOX ) injection  40 mg Subcutaneous Q24H   feeding supplement  237 mL Oral BID BM   gabapentin   300 mg Oral TID   insulin  aspart  0-15 Units Subcutaneous TID WC   mirtazapine   7.5 mg Oral QHS   NIFEdipine   90 mg Oral Daily   sertraline  50 mg Oral Daily   Continuous Infusions:  ceFEPime  (MAXIPIME ) IV 2 g (04/10/24 0618)   vancomycin       PRN Meds:.bisacodyl , HYDROmorphone  (DILAUDID ) injection, ondansetron  **OR** ondansetron  (ZOFRAN ) IV, oxyCODONE , senna-docusate  Current Outpatient Medications  Medication Instructions   allopurinol  (ZYLOPRIM ) 100 mg, Oral, Daily   aspirin  EC 81 mg, Daily   atenolol  (TENORMIN ) 25 MG tablet Daily   atorvastatin  (LIPITOR) 20 mg, Oral, Daily at bedtime   cephALEXin  (KEFLEX ) 500 mg, Oral, 4 times daily   colchicine  0.6 mg, Daily   cyclobenzaprine  (FLEXERIL ) 5 mg, Daily PRN   donepezil  (ARICEPT ) 10 mg, Oral, Daily at bedtime   gabapentin  (NEURONTIN ) 300 mg, Daily   HYDROcodone -acetaminophen  (NORCO) 10-325 MG tablet 1 tablet, Oral, 2 times daily PRN   levETIRAcetam  (KEPPRA ) 750 mg, Oral, 2 times daily   mirtazapine  (REMERON ) 7.5 mg, Daily at bedtime   NIFEdipine  (ADALAT  CC) 90 mg, Daily   pentoxifylline  (TRENTAL ) 400 mg, Oral, 3  times daily with meals   sertraline (ZOLOFT) 50 mg, Oral, Daily   SYMBICORT 80-4.5 MCG/ACT inhaler 2 puffs   Tresiba  FlexTouch 30 Units, Subcutaneous, 2 times daily    Diet Orders (From admission, onward)     Start     Ordered   04/08/24 1640  Diet Carb Modified Fluid consistency: Thin; Room service appropriate? Yes  Diet effective now       Question Answer Comment  Diet-HS Snack? Nothing   Calorie Level Medium  1600-2000   Fluid consistency: Thin   Room service appropriate? Yes      04/08/24 1639            DVT prophylaxis: enoxaparin  (LOVENOX ) injection 40 mg Start: 03/29/24 1000   Lab Results  Component Value Date   PLT 318 04/09/2024      Code Status: Full Code  Family Communication: No family at bedside  Status is: Inpatient   Level of care: Med-Surg  Consultants:  Orthopedic surgery  Objective: Vitals:   04/09/24 2237 04/10/24 0542 04/10/24 0900 04/10/24 0936  BP: 114/63 131/68 131/68 122/88  Pulse: (!) 45 (!) 44 (!) 56 (!) 41  Resp: 17 20  16   Temp: 98.2 F (36.8 C) 97.6 F (36.4 C)  98.3 F (36.8 C)  TempSrc:  Oral  Oral  SpO2: 96% 95%  100%  Weight:      Height:        Intake/Output Summary (Last 24 hours) at 04/10/2024 0956 Last data filed at 04/10/2024 0300 Gross per 24 hour  Intake 800 ml  Output 50 ml  Net 750 ml    Wt Readings from Last 3 Encounters:  04/08/24 91.6 kg  03/05/24 91.6 kg  01/13/24 97.1 kg    Examination:  Constitutional: NAD Eyes: lids and conjunctivae normal, no scleral icterus ENMT: mmm Neck: normal, supple Respiratory: clear to auscultation bilaterally, no wheezing, no crackles.  Cardiovascular: Regular rate and rhythm, no murmurs / rubs / gallops. No LE edema. Abdomen: soft, no distention, no tenderness. Bowel sounds positive.   Data Reviewed: I have independently reviewed following labs and imaging studies   CBC Recent Labs  Lab 04/04/24 1639 04/05/24 0501 04/07/24 0300 04/09/24 0151  WBC 9.1 6.3 5.7 5.7  HGB 12.9 10.5* 10.7* 9.8*  HCT 43.1 34.7* 35.0* 32.0*  PLT 451* 349 350 318  MCV 87.8 87.2 85.8 86.7  MCH 26.3 26.4 26.2 26.6  MCHC 29.9* 30.3 30.6 30.6  RDW 16.8* 17.0* 17.2* 17.5*  LYMPHSABS 1.8  --   --   --   MONOABS 0.5  --   --   --   EOSABS 0.2  --   --   --   BASOSABS 0.0  --   --   --     Recent Labs  Lab 04/04/24 1639 04/04/24 1644 04/04/24 2004 04/05/24 0501 04/06/24 0347  04/07/24 0300 04/09/24 0151  NA 139  --   --  139  --  139 137  K 3.5  --   --  3.7  --  4.2 4.2  CL 103  --   --  107  --  107 106  CO2 22  --   --  25  --  23 25  GLUCOSE 107*  --   --  127*  --  115* 135*  BUN 8  --   --  7*  --  21 17  CREATININE 0.89  --   --  0.77  --  0.96 0.87  CALCIUM  9.7  --   --  9.2  --  9.0 8.6*  AST 17  --   --   --   --  11* 37  ALT 13  --   --   --   --  11 25  ALKPHOS 113  --   --   --   --  88 67  BILITOT 0.5  --   --   --   --  0.4 0.2  ALBUMIN 3.3*  --   --   --   --  2.3* 2.3*  MG  --   --   --   --   --  1.9 1.9  LATICACIDVEN  --  3.1* 1.3  --   --   --   --   INR 1.1  --   --   --   --   --   --   HGBA1C  --   --   --   --  6.6*  --   --     ------------------------------------------------------------------------------------------------------------------ No results for input(s): CHOL, HDL, LDLCALC, TRIG, CHOLHDL, LDLDIRECT in the last 72 hours.  Lab Results  Component Value Date   HGBA1C 6.6 (H) 04/06/2024   ------------------------------------------------------------------------------------------------------------------ No results for input(s): TSH, T4TOTAL, T3FREE, THYROIDAB in the last 72 hours.  Invalid input(s): FREET3  Cardiac Enzymes No results for input(s): CKMB, TROPONINI, MYOGLOBIN in the last 168 hours.  Invalid input(s): CK ------------------------------------------------------------------------------------------------------------------ No results found for: BNP  CBG: Recent Labs  Lab 04/09/24 0825 04/09/24 1201 04/09/24 1653 04/09/24 2100 04/10/24 0802  GLUCAP 108* 165* 133* 216* 158*    Recent Results (from the past 240 hours)  Culture, blood (Routine x 2)     Status: None   Collection Time: 04/04/24  4:38 PM   Specimen: BLOOD  Result Value Ref Range Status   Specimen Description BLOOD SITE NOT SPECIFIED  Final   Special Requests   Final    BOTTLES DRAWN AEROBIC AND  ANAEROBIC Blood Culture results may not be optimal due to an inadequate volume of blood received in culture bottles   Culture   Final    NO GROWTH 5 DAYS Performed at [facility], [address] [place], [place] [address]    Report Status 04/09/2024 FINAL  Final  Culture, blood (Routine x 2)     Status: None   Collection Time: 04/04/24  7:29 PM   Specimen: BLOOD RIGHT ARM  Result Value Ref Range Status   Specimen Description BLOOD RIGHT ARM  Final   Special Requests   Final    BOTTLES DRAWN AEROBIC AND ANAEROBIC Blood Culture results may not be optimal due to an inadequate volume of blood received in culture bottles   Culture   Final    NO GROWTH 5 DAYS Performed at [facility], [address] [place], [place] [address]    Report Status 04/09/2024 FINAL  Final  Surgical PCR screen     Status: None   Collection Time: 04/08/24  4:50 AM   Specimen: Nasal Mucosa; Nasal Swab  Result Value Ref Range Status   MRSA, PCR NEGATIVE NEGATIVE Final   Staphylococcus aureus NEGATIVE NEGATIVE Final    Comment: (NOTE) The Xpert SA Assay (FDA approved for NASAL specimens in patients 16 years of age and older), is one component of a comprehensive surveillance program. It is not intended to diagnose infection nor to guide or monitor treatment. Performed at  [facility], [address] [place], [place] [address]   Aerobic/Anaerobic Culture w Gram Stain (surgical/deep wound)     Status: None (Preliminary result)   Collection Time: 04/08/24  3:12 PM   Specimen: Wound; Tissue  Result Value Ref Range Status   Specimen Description TISSUE RIGHT FOOT  Final   Special Requests NONE  Final   Gram Stain   Final    FEW WBC PRESENT,BOTH PMN AND MONONUCLEAR MODERATE GRAM POSITIVE RODS RARE GRAM POSITIVE COCCI IN PAIRS RARE GRAM NEGATIVE RODS    Culture   Final    CULTURE REINCUBATED FOR BETTER GROWTH Performed at [facility], [address] [place], [place] [address]     Report Status PENDING  Incomplete  Aerobic/Anaerobic Culture w Gram Stain (surgical/deep wound)     Status: None (Preliminary result)   Collection Time: 04/08/24  3:17 PM   Specimen: Wound; Tissue  Result Value Ref Range Status   Specimen Description TISSUE LEFT FOOT  Final   Special Requests NONE  Final   Gram Stain   Final    FEW WBC PRESENT,BOTH PMN AND MONONUCLEAR MODERATE GRAM POSITIVE RODS RARE GRAM POSITIVE COCCI IN PAIRS RARE GRAM NEGATIVE RODS    Culture   Final    TOO YOUNG TO READ Performed at [facility], [address], [place], [place] [address]    Report Status PENDING  Incomplete     Radiology Studies: No results found.    Nilda Fendt, MD, PhD [facility]  Between 7 am - 7 pm I am available, please contact me via Amion (for emergencies) or Securechat (non urgent messages)  Between 7 pm - 7 am I am not available, please contact night coverage MD/APP via Amion

## 2024-04-11 ENCOUNTER — Encounter (HOSPITAL_COMMUNITY): Payer: Self-pay | Admitting: Orthopedic Surgery

## 2024-04-11 DIAGNOSIS — L97302 Non-pressure chronic ulcer of unspecified ankle with fat layer exposed: Secondary | ICD-10-CM | POA: Diagnosis not present

## 2024-04-11 DIAGNOSIS — I83003 Varicose veins of unspecified lower extremity with ulcer of ankle: Secondary | ICD-10-CM | POA: Diagnosis not present

## 2024-04-11 LAB — COMPREHENSIVE METABOLIC PANEL WITH GFR
ALT: 27 U/L (ref 0–44)
AST: 26 U/L (ref 15–41)
Albumin: 2.4 g/dL — ABNORMAL LOW (ref 3.5–5.0)
Alkaline Phosphatase: 81 U/L (ref 38–126)
Anion gap: 9 (ref 5–15)
BUN: 22 mg/dL (ref 8–23)
CO2: 21 mmol/L — ABNORMAL LOW (ref 22–32)
Calcium: 9.1 mg/dL (ref 8.9–10.3)
Chloride: 107 mmol/L (ref 98–111)
Creatinine, Ser: 1.03 mg/dL — ABNORMAL HIGH (ref 0.44–1.00)
GFR, Estimated: 57 mL/min — ABNORMAL LOW (ref >60)
Glucose, Bld: 145 mg/dL — ABNORMAL HIGH (ref 70–99)
Potassium: 4 mmol/L (ref 3.5–5.1)
Sodium: 137 mmol/L (ref 135–145)
Total Bilirubin: 0.5 mg/dL (ref 0.0–1.2)
Total Protein: 7.6 g/dL (ref 6.5–8.1)

## 2024-04-11 LAB — GLUCOSE, CAPILLARY
Glucose-Capillary: 116 mg/dL — ABNORMAL HIGH (ref 70–99)
Glucose-Capillary: 196 mg/dL — ABNORMAL HIGH (ref 70–99)
Glucose-Capillary: 116 mg/dL — ABNORMAL HIGH (ref 70–99)
Glucose-Capillary: 127 mg/dL — ABNORMAL HIGH (ref 70–99)

## 2024-04-11 LAB — CBC
HCT: 32.9 % — ABNORMAL LOW (ref 36.0–46.0)
Hemoglobin: 10.3 g/dL — ABNORMAL LOW (ref 12.0–15.0)
MCH: 26.7 pg (ref 26.0–34.0)
MCHC: 31.3 g/dL (ref 30.0–36.0)
MCV: 85.2 fL (ref 80.0–100.0)
Platelets: 290 K/uL (ref 150–400)
RBC: 3.86 MIL/uL — ABNORMAL LOW (ref 3.87–5.11)
RDW: 17.9 % — ABNORMAL HIGH (ref 11.5–15.5)
WBC: 6.5 K/uL (ref 4.0–10.5)
nRBC: 0 % (ref 0.0–0.2)

## 2024-04-11 LAB — MAGNESIUM: Magnesium: 1.9 mg/dL (ref 1.7–2.4)

## 2024-04-11 MED ORDER — VANCOMYCIN HCL 750 MG/150ML IV SOLN
750.0000 mg | INTRAVENOUS | Status: DC
Start: 2024-04-11 — End: 2024-04-12
  Administered 2024-04-11: 750 mg via INTRAVENOUS
  Filled 2024-04-11 (×2): qty 150

## 2024-04-11 MED ORDER — OXYCODONE HCL 5 MG PO TABS: 10.0000 mg | ORAL_TABLET | ORAL | Status: DC | PRN

## 2024-04-11 MED ORDER — SODIUM CHLORIDE 0.9 % IV SOLN
2.0000 g | Freq: Two times a day (BID) | INTRAVENOUS | Status: DC
  Filled 2024-04-11: qty 12.5

## 2024-04-11 NOTE — Progress Notes (Signed)
 Pharmacy Antibiotic Note  [name] is a 73 y.o. female admitted on 04/04/2024 with wound infection.  Pharmacy has been consulted for Vancomycin  and cefepime  dosing. Tissue cultures were sent and are growing GPC and GNR   Scr 0.84 >> 1.03  Plan: Vancomycin  750mg  IV q24h Est AUC 423, trough 11. Scr 1.03 Cefepime  2gm IV q8h Vancomycin  levels as needed.  Monitor C&S for deescalation  Height: 5' 4 (162.6 cm) Weight: 91.6 kg (201 lb 15.1 oz) IBW/kg (Calculated) : 54.7  Temp (24hrs), Avg:98.2 F (36.8 C), Min:97.9 F (36.6 C), Max:98.4 F (36.9 C)  Recent Labs  Lab 04/04/24 1639 04/04/24 1644 04/04/24 2004 04/05/24 0501 04/07/24 0300 04/09/24 0151 04/11/24 0627  WBC 9.1  --   --  6.3 5.7 5.7 6.5  CREATININE 0.89  --   --  0.77 0.96 0.87 1.03*  LATICACIDVEN  --  3.1* 1.3  --   --   --   --     Estimated Creatinine Clearance: 52.6 mL/min (A) (by C-G formula based on SCr of 1.03 mg/dL (H)).    Allergies  Allergen Reactions   Ace Inhibitors Swelling   Latex Itching and Swelling   Ultram [Tramadol] Nausea And Vomiting   Zanaflex [Tizanidine] Other (See Comments)    Tremors    Diprivan  [Propofol ] Itching   Metformin And Related Other (See Comments)    Tremors  Chills    Antimicrobials this admission: 10/25 Vancomycin  >>  10/25 Cefepime  >>  Microbiology results: 10/24 Tcx Left foot: C. Freundii/koseri 10/24 Tcx Right foot: C. koseri   Cadan Maggart BS, PharmD, BCPS Clinical Pharmacist 04/11/2024 7:50 AM  Contact: [phone] after 3 PM

## 2024-04-11 NOTE — Progress Notes (Signed)
 Mobility Specialist Progress Note:    04/11/24 1153  Mobility  Activity Pivoted/transferred from bed to chair  Level of Assistance Contact guard assist, steadying assist  Assistive Device Front wheel walker  Distance Ambulated (ft) 5 ft  RLE Weight Bearing Per Provider Order WBAT  LLE Weight Bearing Per Provider Order WBAT  Activity Response Tolerated well  Mobility Referral Yes  Mobility visit 1 Mobility  Mobility Specialist Start Time (ACUTE ONLY) 1003  Mobility Specialist Stop Time (ACUTE ONLY) 1013  Mobility Specialist Time Calculation (min) (ACUTE ONLY) 10 min   Received pt EOB and agreeable to mobility. Pt required MinG for safety. Pt c/o BLE foot pain, otherwise tolerated well. Left pt in chair. Personal belongings and call light within reach. All needs met.  Lavanda Pollack Mobility Specialist  Please contact via [employer] or  Rehab Office [phone]

## 2024-04-11 NOTE — TOC Initial Note (Addendum)
 Transition of Care (TOC) - Initial/Assessment Note   Spoke to patient and husband [name] at bedside. Orders for [facility] and PT. Patient agreeable.   Secure chatted Dr Harden to clarily if patient will be discharging with Prevena VAC's and dressing changes. Await response . Patient would like [facility], Kelly with [facility] accepted referral, but requested clarification on VAC's.   Patient's address is [address] [name], [place]    Dr Harden : She will not need any dressing changes. We will start dressing changes at follow-up in the office. [facility] aware   Patient using power wheelchair at home  Patient Details  Name: [name] MRN: [id number] Date of Birth: August 07, 1950  Transition of Care [facility]) CM/SW Contact:    Stephane Powell Jansky, RN Phone Number: 04/11/2024, 10:47 AM  Clinical Narrative:                   Expected Discharge Plan: Home w Home Health Services Barriers to Discharge: Continued Medical Work up   Patient Goals and CMS Choice Patient states their goals for this hospitalization and ongoing recovery are:: to return to home CMS Medicare.gov Compare Post Acute Care list provided to:: Patient Choice offered to / list presented to : Patient      Expected Discharge Plan and Services   Discharge Planning Services: CM Consult Post Acute Care Choice: Home Health Living arrangements for the past 2 months: Apartment                 DME Arranged: N/A DME Agency: NA       HH Arranged: PT, RN HH Agency: [facility] Date HH Agency Contacted: 04/11/24 Time HH Agency Contacted: 1046 Representative spoke with at [facility] Agency: [facility]  Prior Living Arrangements/Services Living arrangements for the past 2 months: Apartment Lives with:: Spouse Patient language and need for interpreter reviewed:: Yes Do you feel safe going back to the place where you live?: Yes      Need for Family Participation in Patient Care: Yes (Comment) Care giver support  system in place?: Yes (comment) Current home services: DME Criminal Activity/Legal Involvement Pertinent to Current Situation/Hospitalization: No - Comment as needed  Activities of Daily Living   ADL Screening (condition at time of admission) Independently performs ADLs?: No Does the patient have a NEW difficulty with bathing/dressing/toileting/self-feeding that is expected to last >3 days?: No Does the patient have a NEW difficulty with getting in/out of bed, walking, or climbing stairs that is expected to last >3 days?: No Does the patient have a NEW difficulty with communication that is expected to last >3 days?: No Is the patient deaf or have difficulty hearing?: No Does the patient have difficulty seeing, even when wearing glasses/contacts?: No Does the patient have difficulty concentrating, remembering, or making decisions?: No  Permission Sought/Granted   Permission granted to share information with : Yes, Verbal Permission Granted     Permission granted to share info w AGENCY: [facility]        Emotional Assessment Appearance:: Appears stated age Attitude/Demeanor/Rapport: Engaged Affect (typically observed): Appropriate Orientation: : Oriented to Self, Oriented to Place, Oriented to  Time, Oriented to Situation Alcohol / Substance Use: Not Applicable Psych Involvement: No (comment)  Admission diagnosis:  Venous stasis ulcer (HCC) [I83.009, L97.909] Patient Active Problem List   Diagnosis Date Noted   Cutaneous abscess of left foot 04/08/2024   Open wound of both lower extremities 04/05/2024   Venous stasis ulcer (HCC) 04/04/2024  Seizure (HCC) 06/10/2023   Stroke-like symptoms 06/09/2023   Complex partial seizure (HCC) 06/09/2023   Ulcer of extremity due to chronic venous insufficiency (HCC) 02/19/2023   Lymphedema 02/19/2023   SBO (small bowel obstruction) (HCC) 02/18/2023   GAD (generalized anxiety disorder) 02/18/2023   Asthma, chronic 02/18/2023   Skin ulcer  due to diabetes mellitus (HCC) 11/11/2022   Encounter for change or removal of nonsurgical wound dressing 11/02/2022   Type 2 diabetes mellitus with diabetic peripheral angiopathy without gangrene (HCC) 11/02/2022   Non-prs chronic ulcer oth prt l low leg w fat layer exposed (HCC) 11/02/2022   Diabetic ulcer of left lower leg (HCC) 10/24/2022   Chronic venous hypertension (idiopathic) with ulcer and inflammation of bilateral lower extremity (HCC) 10/24/2022   Non-healing wound of lower extremity 10/23/2022   Bipolar affective disorder, currently depressed, moderate (HCC) 09/02/2022   Peripheral vascular disorder due to diabetes mellitus (HCC) 09/02/2022   Alzheimer's disease (HCC) 07/14/2022   Cramps of lower extremity 07/14/2022   Other specified diabetes mellitus with diabetic polyneuropathy (HCC) 07/14/2022   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/14/2022   Type 2 diabetes mellitus with peripheral angiopathy (HCC) 07/14/2022   Hyperglycemia due to type 2 diabetes mellitus (HCC) 07/14/2022   Bipolar disorder (HCC) 06/30/2022   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) 06/30/2022   Morbid (severe) obesity due to excess calories (HCC) 06/30/2022   Bacteria in urine 06/26/2022   Stasis ulcer of lower extremity (HCC) 06/26/2022   Elevated troponin 06/25/2022   Abnormal urinalysis 06/25/2022   History of CVA (cerebrovascular accident) 06/25/2022   Memory loss 06/25/2022   Unspecified dementia, unspecified severity, with anxiety (HCC) 06/16/2022   History of falling 06/16/2022   Long term (current) use of insulin  (HCC) 06/16/2022   Other chronic pain 06/16/2022   Body mass index (BMI) 50.0-59.9, adult (HCC) 06/16/2022   Cryptogenic stroke (HCC) 06/11/2022   Middle cerebral artery embolism, 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   Non-adherence to medical treatment 03/15/2022   Chronic venous insufficiency 03/15/2022   DM (diabetes mellitus), type 2 (HCC) 03/15/2022    Stasis dermatitis of right lower extremity with venous ulcer due to chronic peripheral venous hypertension (HCC) 03/15/2022   Venous stasis ulcers of both lower extremities (HCC) 03/15/2022   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   Wound infection 02/05/2021   Venous stasis dermatitis of both lower extremities    Pneumonia due to COVID-19 virus 06/29/2019   Hypophosphatemia 06/24/2019   Obesity 06/23/2019   Insulin  dependent type 2 diabetes mellitus (HCC) 06/22/2019   Anxiety 06/22/2019   Essential hypertension 06/22/2019   Hyperlipidemia 06/22/2019   COVID-19 06/21/2019   Severe protein-calorie malnutrition 06/21/2019   Acute respiratory failure with hypoxia (HCC) 06/21/2019   Diabetic foot ulcer associated with type 2 diabetes mellitus (HCC)    Screening for cervical cancer 03/15/2017   Peripheral edema 09/09/2016   Venous stasis ulcer of left calf limited to breakdown of skin without varicose veins (HCC) 07/23/2016   Abnormal mammogram 07/16/2016   Cellulitis 07/05/2016   Anxiety and depression 05/30/2016   Lower extremity ulceration (HCC) 05/30/2016   Generalized weakness 05/30/2016   Left arm weakness 05/30/2016   Muscle spasm of both lower legs 05/19/2016   Neuropathy 05/19/2016   Sciatica of right side 05/19/2016   Diabetes mellitus (HCC) 05/19/2016   Left Lower extremity pain  04/02/2016   Lower extremity pain, inferior, left 04/02/2016   Left leg pain    Personal history of  noncompliance with medical treatment, presenting hazards to health 03/31/2016   Jaw pain 03/27/2016   Left leg cellulitis 03/24/2016   Cellulitis of left lower extremity    Primary insomnia    Adjustment disorder    Morbid obesity (HCC) 01/27/2016   Wheelchair dependent 01/27/2016   Gout 01/27/2016   Arthritis 01/27/2016   Asthma 01/27/2016   Uncontrolled type 2 diabetes mellitus with hypoglycemia, with long-term current use of insulin  (HCC) 01/25/2016   Cellulitis of left leg  01/11/2016   Insulin -requiring or dependent type II diabetes mellitus (HCC) 01/11/2016   Hypertension 01/11/2016   PCP:  [facility] Pharmacy:   [facility] [phone] - [place], [place] - [address] AT [address] [place] [address] Phone: [phone] Fax: [phone]  [facility] #[id number] - [place], [place] - [address] AT [facility] OF [address] [place] [address] Phone: [phone] Fax: [phone]     Social Drivers of Health (SDOH) Social History: SDOH Screenings   Food Insecurity: No Food Insecurity (04/05/2024)  Housing: Low Risk  (04/05/2024)  Transportation Needs: No Transportation Needs (04/05/2024)  Utilities: Not At Risk (04/05/2024)  Social Connections: Moderately Isolated (04/05/2024)  Tobacco Use: Low Risk  (04/08/2024)   SDOH Interventions:     Readmission Risk Interventions     No data to display

## 2024-04-11 NOTE — Plan of Care (Signed)
  Problem: Education: Goal: Ability to describe self-care measures that may prevent or decrease complications (Diabetes Survival Skills Education) will improve Outcome: Progressing   Problem: Skin Integrity: Goal: Risk for impaired skin integrity will decrease Outcome: Progressing   Problem: Tissue Perfusion: Goal: Adequacy of tissue perfusion will improve Outcome: Progressing   Problem: Activity: Goal: Risk for activity intolerance will decrease Outcome: Progressing   Problem: Nutrition: Goal: Adequate nutrition will be maintained Outcome: Progressing

## 2024-04-11 NOTE — Progress Notes (Signed)
 PROGRESS NOTE  [name] FMW:[id number] DOB: 24-Dec-1950 DOA: 04/04/2024 PCP: [facility]   LOS: 2 days   Brief Narrative / Interim history: 73 year old female with IDDM, HTN, HLD, prior CVA, seizure disorder, bipolar, chronic venous stasis ulcer who was sent to the hospital by PCP for her chronic wounds.  She was seen by orthopedic surgery about a year ago and is status post debridement.  She has a home health RN visits about once a week now, and she has been unable to manage her wounds at home to well.  She was admitted to the hospital, imaging did not show any evidence of osteomyelitis, orthopedic surgery was consulted with plans in place for surgical debridement on 10/24  Subjective / 24h Interval events: Bilateral pain foot present this morning  Assesement and Plan: Principal problem Chronic venous stasis ulcers, leg wounds, lymphedema-Main reason for her hospitalization, she was sent by PCP.  X-rays revealed soft tissue swelling but no osteomyelitis, no significant cellulitis. - Continue pain control - ABIs unremarkable bilaterally - Orthopedic surgery consulted, she went to the OR 10/24 status post excisional debridement, skin excision with Kerecis micro graft.  Cultures were sent and are growing GPC and GNR.  Per Ortho she will need antibiotics on discharge.  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  CBGs acceptable  Lab Results  Component Value Date   HGBA1C 6.6 (H) 04/06/2024   CBG (last 3)  Recent Labs    04/10/24 1639 04/10/24 2026 04/11/24 0841   GLUCAP 186* 175* 116*    Scheduled Meds:  acetaminophen   1,000 mg Oral TID   allopurinol   100 mg Oral Daily   aspirin  EC  81 mg Oral Daily   atenolol   25 mg Oral Daily   atorvastatin   20 mg Oral QHS   colchicine   0.6 mg Oral Daily   cyclobenzaprine   5 mg Oral Daily   enoxaparin  (LOVENOX ) injection  40 mg Subcutaneous Q24H   feeding supplement  237 mL Oral BID BM   gabapentin   300 mg Oral TID   insulin  aspart  0-15 Units Subcutaneous TID WC   mirtazapine   7.5 mg Oral QHS   NIFEdipine   90 mg Oral Daily   sertraline  50 mg Oral Daily   Continuous Infusions:  ceFEPime  (MAXIPIME ) IV     vancomycin       PRN Meds:.bisacodyl , HYDROmorphone  (DILAUDID ) injection, ondansetron  **OR** ondansetron  (ZOFRAN ) IV, oxyCODONE , senna-docusate  Current Outpatient Medications  Medication Instructions   allopurinol  (ZYLOPRIM ) 100 mg, Oral, Daily   aspirin  EC 81 mg, Daily   atenolol  (TENORMIN ) 25 MG tablet Daily   atorvastatin  (LIPITOR) 20 mg, Oral, Daily at bedtime   cephALEXin  (KEFLEX ) 500 mg, Oral, 4 times daily   colchicine  0.6 mg, Daily   cyclobenzaprine  (FLEXERIL ) 5 mg, Daily PRN   donepezil  (ARICEPT ) 10 mg, Oral, Daily at bedtime   gabapentin  (NEURONTIN ) 300 mg, Daily   HYDROcodone -acetaminophen  (NORCO) 10-325 MG tablet 1 tablet, Oral, 2 times daily PRN   levETIRAcetam  (KEPPRA ) 750 mg, Oral, 2 times daily   mirtazapine  (REMERON ) 7.5 mg, Daily at bedtime   NIFEdipine  (ADALAT  CC) 90 mg, Daily   pentoxifylline  (TRENTAL ) 400 mg, Oral, 3 times daily with meals  sertraline (ZOLOFT) 50 mg, Oral, Daily   SYMBICORT 80-4.5 MCG/ACT inhaler 2 puffs   Tresiba  FlexTouch 30 Units, Subcutaneous, 2 times daily    Diet Orders (From admission, onward)     Start     Ordered   04/08/24 1640  Diet Carb Modified Fluid consistency: Thin; Room service appropriate? Yes  Diet effective now       Question Answer Comment  Diet-HS Snack? Nothing   Calorie Level Medium 1600-2000   Fluid consistency: Thin    Room service appropriate? Yes      04/08/24 1639            DVT prophylaxis: enoxaparin  (LOVENOX ) injection 40 mg Start: 03/29/24 1000   Lab Results  Component Value Date   PLT 290 04/11/2024      Code Status: Full Code  Family Communication: No family at bedside  Status is: Inpatient   Level of care: Med-Surg  Consultants:  Orthopedic surgery  Objective: Vitals:   04/10/24 1642 04/10/24 2237 04/11/24 0425 04/11/24 0942  BP: (!) 103/58 120/63 122/61 130/76  Pulse: (!) 44 (!) 45 81 81  Resp: 15 17 17 15   Temp: 98.2 F (36.8 C) 98.4 F (36.9 C) 97.9 F (36.6 C) 97.6 F (36.4 C)  TempSrc: Oral Oral Oral   SpO2: 99% 97% 97% 100%  Weight:      Height:        Intake/Output Summary (Last 24 hours) at 04/11/2024 1021 Last data filed at 04/11/2024 0300 Gross per 24 hour  Intake 840 ml  Output --  Net 840 ml    Wt Readings from Last 3 Encounters:  04/08/24 91.6 kg  03/05/24 91.6 kg  01/13/24 97.1 kg    Examination:  Constitutional: NAD Eyes: lids and conjunctivae normal, no scleral icterus ENMT: mmm Neck: normal, supple Respiratory: clear to auscultation bilaterally, no wheezing, no crackles.  Cardiovascular: Regular rate and rhythm, no murmurs / rubs / gallops. No LE edema. Abdomen: soft, no distention, no tenderness. Bowel sounds positive.   Data Reviewed: I have independently reviewed following labs and imaging studies   CBC Recent Labs  Lab 04/04/24 1639 04/05/24 0501 04/07/24 0300 04/09/24 0151 04/11/24 0627  WBC 9.1 6.3 5.7 5.7 6.5  HGB 12.9 10.5* 10.7* 9.8* 10.3*  HCT 43.1 34.7* 35.0* 32.0* 32.9*  PLT 451* 349 350 318 290  MCV 87.8 87.2 85.8 86.7 85.2  MCH 26.3 26.4 26.2 26.6 26.7  MCHC 29.9* 30.3 30.6 30.6 31.3  RDW 16.8* 17.0* 17.2* 17.5* 17.9*  LYMPHSABS 1.8  --   --   --   --   MONOABS 0.5  --   --   --   --   EOSABS 0.2  --   --   --   --   BASOSABS 0.0  --   --   --   --     Recent Labs  Lab 04/04/24 1639  04/04/24 1644 04/04/24 2004 04/05/24 0501 04/06/24 0347 04/07/24 0300 04/09/24 0151 04/11/24 0627  NA 139  --   --  139  --  139 137 137  K 3.5  --   --  3.7  --  4.2 4.2 4.0  CL 103  --   --  107  --  107 106 107  CO2 22  --   --  25  --  23 25 21*  GLUCOSE 107*  --   --  127*  --  115* 135* 145*  BUN 8  --   --  7*  --  21 17 22   CREATININE 0.89  --   --  0.77  --  0.96 0.87 1.03*  CALCIUM  9.7  --   --  9.2  --  9.0 8.6* 9.1  AST 17  --   --   --   --  11* 37 26  ALT 13  --   --   --   --  11 25 27   ALKPHOS 113  --   --   --   --  88 67 81  BILITOT 0.5  --   --   --   --  0.4 0.2 0.5  ALBUMIN 3.3*  --   --   --   --  2.3* 2.3* 2.4*  MG  --   --   --   --   --  1.9 1.9 1.9  LATICACIDVEN  --  3.1* 1.3  --   --   --   --   --   INR 1.1  --   --   --   --   --   --   --   HGBA1C  --   --   --   --  6.6*  --   --   --     ------------------------------------------------------------------------------------------------------------------ No results for input(s): CHOL, HDL, LDLCALC, TRIG, CHOLHDL, LDLDIRECT in the last 72 hours.  Lab Results  Component Value Date   HGBA1C 6.6 (H) 04/06/2024   ------------------------------------------------------------------------------------------------------------------ No results for input(s): TSH, T4TOTAL, T3FREE, THYROIDAB in the last 72 hours.  Invalid input(s): FREET3  Cardiac Enzymes No results for input(s): CKMB, TROPONINI, MYOGLOBIN in the last 168 hours.  Invalid input(s): CK ------------------------------------------------------------------------------------------------------------------ No results found for: BNP  CBG: Recent Labs  Lab 04/10/24 1158 04/10/24 1554 04/10/24 1639 04/10/24 2026 04/11/24 0841  GLUCAP 196* 174* 186* 175* 116*    Recent Results (from the past 240 hours)  Culture, blood (Routine x 2)     Status: None   Collection Time: 04/04/24  4:38 PM   Specimen: BLOOD   Result Value Ref Range Status   Specimen Description BLOOD SITE NOT SPECIFIED  Final   Special Requests   Final    BOTTLES DRAWN AEROBIC AND ANAEROBIC Blood Culture results may not be optimal due to an inadequate volume of blood received in culture bottles   Culture   Final    NO GROWTH 5 DAYS Performed at [facility], [address] [place], [place] [address]    Report Status 04/09/2024 FINAL  Final  Culture, blood (Routine x 2)     Status: None   Collection Time: 04/04/24  7:29 PM   Specimen: BLOOD RIGHT ARM  Result Value Ref Range Status   Specimen Description BLOOD RIGHT ARM  Final   Special Requests   Final    BOTTLES DRAWN AEROBIC AND ANAEROBIC Blood Culture results may not be optimal due to an inadequate volume of blood received in culture bottles   Culture   Final    NO GROWTH 5 DAYS Performed at [facility], [address] [place], [place] [address]    Report Status 04/09/2024 FINAL  Final  Surgical PCR screen     Status: None   Collection Time: 04/08/24  4:50 AM   Specimen: Nasal Mucosa; Nasal Swab  Result Value Ref Range Status   MRSA, PCR NEGATIVE NEGATIVE Final   Staphylococcus aureus NEGATIVE NEGATIVE Final    Comment: (NOTE) The Xpert SA  Assay (FDA approved for NASAL specimens in patients 51 years of age and older), is one component of a comprehensive surveillance program. It is not intended to diagnose infection nor to guide or monitor treatment. Performed at [facility], [address], [place], [place] [address]   Aerobic/Anaerobic Culture w Gram Stain (surgical/deep wound)     Status: None (Preliminary result)   Collection Time: 04/08/24  3:12 PM   Specimen: Wound; Tissue  Result Value Ref Range Status   Specimen Description TISSUE RIGHT FOOT  Final   Special Requests NONE  Final   Gram Stain   Final    FEW WBC PRESENT,BOTH PMN AND MONONUCLEAR MODERATE GRAM POSITIVE RODS RARE GRAM POSITIVE COCCI IN PAIRS RARE GRAM NEGATIVE  RODS    Culture   Final    ABUNDANT CITROBACTER KOSERI NO ANAEROBES ISOLATED; CULTURE IN PROGRESS FOR 5 DAYS CULTURE REINCUBATED FOR BETTER GROWTH Performed at [facility], [address], [place], [place] [address]    Report Status PENDING  Incomplete   Organism ID, Bacteria CITROBACTER KOSERI  Final      Susceptibility   Citrobacter koseri - MIC*    CEFEPIME  <=0.12 SENSITIVE Sensitive     ERTAPENEM <=0.12 SENSITIVE Sensitive     CEFTRIAXONE  <=0.25 SENSITIVE Sensitive     CIPROFLOXACIN  <=0.06 SENSITIVE Sensitive     GENTAMICIN <=1 SENSITIVE Sensitive     MEROPENEM <=0.25 SENSITIVE Sensitive     TRIMETH/SULFA <=20 SENSITIVE Sensitive     PIP/TAZO Value in next row Sensitive      <=4 SENSITIVEThis is a modified FDA-approved test that has been validated and its performance characteristics determined by the reporting laboratory.  This laboratory is certified under the Clinical Laboratory Improvement Amendments CLIA as qualified to perform high complexity clinical laboratory testing.    * ABUNDANT CITROBACTER KOSERI  Aerobic/Anaerobic Culture w Gram Stain (surgical/deep wound)     Status: None (Preliminary result)   Collection Time: 04/08/24  3:17 PM   Specimen: Wound; Tissue  Result Value Ref Range Status   Specimen Description TISSUE LEFT FOOT  Final   Special Requests NONE  Final   Gram Stain   Final    FEW WBC PRESENT,BOTH PMN AND MONONUCLEAR MODERATE GRAM POSITIVE RODS RARE GRAM POSITIVE COCCI IN PAIRS RARE GRAM NEGATIVE RODS Performed at [facility], [address] [place], [place] [address]    Culture   Final    FEW CITROBACTER FREUNDII FEW CITROBACTER KOSERI NO ANAEROBES ISOLATED; CULTURE IN PROGRESS FOR 5 DAYS    Report Status PENDING  Incomplete   Organism ID, Bacteria CITROBACTER FREUNDII  Final   Organism ID, Bacteria CITROBACTER KOSERI  Final      Susceptibility   Citrobacter freundii - MIC*    CEFEPIME  <=0.12 SENSITIVE Sensitive     ERTAPENEM  <=0.12 SENSITIVE Sensitive     CEFTRIAXONE  <=0.25 SENSITIVE Sensitive     CIPROFLOXACIN  <=0.06 SENSITIVE Sensitive     GENTAMICIN <=1 SENSITIVE Sensitive     MEROPENEM <=0.25 SENSITIVE Sensitive     TRIMETH/SULFA <=20 SENSITIVE Sensitive     PIP/TAZO Value in next row Sensitive      <=4 SENSITIVEThis is a modified FDA-approved test that has been validated and its performance characteristics determined by the reporting laboratory.  This laboratory is certified under the Clinical Laboratory Improvement Amendments CLIA as qualified to perform high complexity clinical laboratory testing.    * FEW CITROBACTER FREUNDII   Citrobacter koseri - MIC*  CEFEPIME  Value in next row Sensitive      <=4 SENSITIVEThis is a modified FDA-approved test that has been validated and its performance characteristics determined by the reporting laboratory.  This laboratory is certified under the Clinical Laboratory Improvement Amendments CLIA as qualified to perform high complexity clinical laboratory testing.    ERTAPENEM Value in next row Sensitive      <=4 SENSITIVEThis is a modified FDA-approved test that has been validated and its performance characteristics determined by the reporting laboratory.  This laboratory is certified under the Clinical Laboratory Improvement Amendments CLIA as qualified to perform high complexity clinical laboratory testing.    CEFTRIAXONE  Value in next row Sensitive      <=4 SENSITIVEThis is a modified FDA-approved test that has been validated and its performance characteristics determined by the reporting laboratory.  This laboratory is certified under the Clinical Laboratory Improvement Amendments CLIA as qualified to perform high complexity clinical laboratory testing.    CIPROFLOXACIN  Value in next row Sensitive      <=4 SENSITIVEThis is a modified FDA-approved test that has been validated and its performance characteristics determined by the reporting laboratory.  This laboratory is  certified under the Clinical Laboratory Improvement Amendments CLIA as qualified to perform high complexity clinical laboratory testing.    GENTAMICIN Value in next row Sensitive      <=4 SENSITIVEThis is a modified FDA-approved test that has been validated and its performance characteristics determined by the reporting laboratory.  This laboratory is certified under the Clinical Laboratory Improvement Amendments CLIA as qualified to perform high complexity clinical laboratory testing.    MEROPENEM Value in next row Sensitive      <=4 SENSITIVEThis is a modified FDA-approved test that has been validated and its performance characteristics determined by the reporting laboratory.  This laboratory is certified under the Clinical Laboratory Improvement Amendments CLIA as qualified to perform high complexity clinical laboratory testing.    TRIMETH/SULFA Value in next row Sensitive      <=4 SENSITIVEThis is a modified FDA-approved test that has been validated and its performance characteristics determined by the reporting laboratory.  This laboratory is certified under the Clinical Laboratory Improvement Amendments CLIA as qualified to perform high complexity clinical laboratory testing.    PIP/TAZO Value in next row Sensitive      <=4 SENSITIVEThis is a modified FDA-approved test that has been validated and its performance characteristics determined by the reporting laboratory.  This laboratory is certified under the Clinical Laboratory Improvement Amendments CLIA as qualified to perform high complexity clinical laboratory testing.    * FEW CITROBACTER KOSERI     Radiology Studies: No results found.    Nilda Fendt, MD, PhD [facility]  Between 7 am - 7 pm I am available, please contact me via Amion (for emergencies) or Securechat (non urgent messages)  Between 7 pm - 7 am I am not available, please contact night coverage MD/APP via Amion

## 2024-04-11 NOTE — Progress Notes (Signed)
 Mobility Specialist Progress Note:    04/11/24 1411  Mobility  Activity Pivoted/transferred from chair to bed  Level of Assistance Contact guard assist, steadying assist  Assistive Device Front wheel walker  Distance Ambulated (ft) 5 ft  RLE Weight Bearing Per Provider Order WBAT  LLE Weight Bearing Per Provider Order WBAT  Activity Response Tolerated well  Mobility Referral Yes  Mobility visit 1 Mobility  Mobility Specialist Start Time (ACUTE ONLY) 1400  Mobility Specialist Stop Time (ACUTE ONLY) 1410  Mobility Specialist Time Calculation (min) (ACUTE ONLY) 10 min   Received pt in chair and agreeable to mobility. Pt required MinG to bed. No c/o. Left pt EOB. Personal belongings and call light within reach. All needs met.  Lavanda Pollack Mobility Specialist  Please contact via [employer] or  Rehab Office [phone]

## 2024-04-12 ENCOUNTER — Other Ambulatory Visit (HOSPITAL_COMMUNITY): Payer: Self-pay

## 2024-04-12 DIAGNOSIS — L97312 Non-pressure chronic ulcer of right ankle with fat layer exposed: Secondary | ICD-10-CM | POA: Diagnosis not present

## 2024-04-12 DIAGNOSIS — I83013 Varicose veins of right lower extremity with ulcer of ankle: Secondary | ICD-10-CM | POA: Diagnosis not present

## 2024-04-12 LAB — GLUCOSE, CAPILLARY: Glucose-Capillary: 107 mg/dL — ABNORMAL HIGH (ref 70–99)

## 2024-04-12 MED ORDER — OXYCODONE HCL 10 MG PO TABS
10.0000 mg | ORAL_TABLET | Freq: Four times a day (QID) | ORAL | 0 refills | Status: AC | PRN
  Filled 2024-04-12: qty 20, 5d supply, fill #0

## 2024-04-12 MED ORDER — CIPROFLOXACIN HCL 500 MG PO TABS
500.0000 mg | ORAL_TABLET | Freq: Two times a day (BID) | ORAL | 0 refills | Status: AC
  Filled 2024-04-12: qty 14, 7d supply, fill #0

## 2024-04-12 NOTE — Discharge Summary (Signed)
 Physician Discharge Summary  [name] FMW:[id number] DOB: [birth year] DOA: 04/04/2024  PCP: [facility] [address]  Admit date: 04/04/2024 Discharge date: 04/12/2024  Admitted From: home Disposition:  home  Recommendations for Outpatient Follow-up:  Follow up with PCP in 1-2 weeks Follow up with Dr Harden this week  Home Health: PT Equipment/Devices: wound vac bilateral ankles  Discharge Condition: stable CODE STATUS: Full code Diet Orders (From admission, onward)     Start     Ordered   04/08/24 1640  Diet Carb Modified Fluid consistency: Thin; Room service appropriate? Yes  Diet effective now       Question Answer Comment  Diet-HS Snack? Nothing   Calorie Level Medium 1600-2000   Fluid consistency: Thin   Room service appropriate? Yes      04/08/24 1639           Brief Narrative / Interim history: 73 year old female with IDDM, HTN, HLD, prior CVA, seizure disorder, bipolar, chronic venous stasis ulcer who was sent to the hospital by PCP for her chronic wounds.  She was seen by orthopedic surgery about a year ago and is status post debridement.  She has a home health RN visits about once a week now, and she has been unable to manage her wounds at home to well.  She was admitted to the hospital, imaging did not show any evidence of osteomyelitis, orthopedic surgery was consulted with plans in place for surgical debridement on 10/24  Hospital Course / Discharge diagnoses: Principal problem Chronic venous stasis ulcers, leg wounds, lymphedema-Main reason for her hospitalization, she was sent by PCP.  X-rays revealed soft tissue swelling but no osteomyelitis, no significant cellulitis. ABIs unremarkable bilaterally. Orthopedic surgery consulted, she went to the OR 10/24 status post excisional debridement, skin excision with Kerecis micro graft. Cultures are showing pansensitive Citrobacter kloseri and freundii, morganella. Discussed with ID, will place on Ciprofloxacin  for 7  days, based on sensitivities.    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      Allergies as of 04/12/2024       Reactions   Ace Inhibitors Swelling   Latex Itching, Swelling   Ultram [tramadol] Nausea And Vomiting   Zanaflex [tizanidine] Other (See Comments)   Tremors    Diprivan  [propofol ] Itching   Metformin And Related Other (See Comments)   Tremors  Chills        Medication List     STOP taking these medications    cephALEXin  500 MG capsule Commonly known as: KEFLEX    HYDROcodone -acetaminophen  10-325 MG tablet Commonly known as: NORCO       TAKE these medications    allopurinol  100 MG tablet Commonly known as: ZYLOPRIM  Take 100 mg by mouth daily.   aspirin  EC 81 MG tablet Take 81 mg by mouth daily. Swallow whole.   atenolol  25 MG tablet Commonly known as: TENORMIN  Take by mouth daily.   atorvastatin  20 MG tablet Commonly known as: LIPITOR Take 1 tablet (20 mg total) by mouth at bedtime.   ciprofloxacin  500 MG tablet Commonly known as: Cipro  Take 1 tablet (500 mg  total) by mouth 2 (two) times daily for 7 days.   colchicine  0.6 MG tablet Take 0.6 mg by mouth daily.   cyclobenzaprine  5 MG tablet Commonly known as: FLEXERIL  Take 5 mg by mouth daily as needed for muscle spasms.   donepezil  10 MG tablet Commonly known as: ARICEPT  Take 10 mg by mouth at bedtime.   gabapentin  300 MG capsule Commonly known as: NEURONTIN  Take 300 mg by mouth  daily.   levETIRAcetam  750 MG tablet Commonly known as: Keppra  Take 1 tablet (750 mg total) by mouth 2 (two) times daily.   mirtazapine  7.5 MG tablet Commonly known as: REMERON  Take 7.5 mg by mouth at bedtime.   NIFEdipine  90 MG 24 hr tablet Commonly known as: ADALAT  CC Take 90 mg by mouth daily.   Oxycodone  HCl 10 MG Tabs Take 1 tablet (10 mg total) by mouth every 6 (six) hours as needed for severe pain (pain score 7-10) or moderate pain (pain score 4-6).   pentoxifylline  400 MG CR tablet Commonly known as: TRENTAL  Take 1 tablet (400 mg total) by mouth 3 (three) times daily with meals.   sertraline 50 MG tablet Commonly known as: ZOLOFT Take 50 mg by mouth daily.   Symbicort 80-4.5 MCG/ACT inhaler Generic drug: budesonide-formoterol  Inhale 2 puffs into the lungs.   Tresiba  FlexTouch 200 UNIT/ML FlexTouch Pen Generic drug: insulin  degludec Inject 30 Units into the skin 2 (two) times daily.        Follow-up Information     Harden Jerona GAILS, MD Follow up in 1 week(s).   Specialty: Orthopedic Surgery Contact information: [address] [place]  [place] [address] [phone]                 Consultations: Orthopedics   Procedures/Studies:  VAS US  ABI WITH/WO TBI Result Date: 04/05/2024  LOWER EXTREMITY DOPPLER STUDY Patient Name:  [name]  Date of Exam:   04/05/2024 Medical Rec #: [id number]        Accession #:    [id number] Date of Birth: [birth year]       Patient Gender: F Patient Age:   70 years Exam Location:  [facility] Procedure:      VAS US  ABI WITH/WO TBI Referring Phys: PROSPER AMPONSAH --------------------------------------------------------------------------------  Indications: Ulceration, and peripheral artery disease. Chronic venous stasis. High Risk Factors: Hypertension, hyperlipidemia, Diabetes, no history of                    smoking, prior CVA.  Limitations: Today's exam was limited due to Wrap around bandages. Comparison Study:  Previous study on 5.9.2024. Performing Technologist: Edilia Elden Appl  Examination Guidelines: A complete evaluation includes at minimum, Doppler waveform signals and systolic blood pressure reading at the level of bilateral brachial, anterior tibial, and posterior tibial arteries, when vessel segments are accessible. Bilateral testing is considered an integral part of a complete examination. Photoelectric Plethysmograph (PPG) waveforms and toe systolic pressure readings are included as required and additional duplex testing as needed. Limited examinations for reoccurring indications may be performed as noted.  ABI Findings: +---------+------------------+-----+---------+--------+ Right    Rt Pressure (mmHg)IndexWaveform Comment  +---------+------------------+-----+---------+--------+ Brachial 153                    triphasic         +---------+------------------+-----+---------+--------+ DP       156               0.99 biphasic          +---------+------------------+-----+---------+--------+  Great Toe115               0.73 Normal            +---------+------------------+-----+---------+--------+ +---------+------------------+-----+---------+-----------------+ Left     Lt Pressure (mmHg)IndexWaveform Comment           +---------+------------------+-----+---------+-----------------+ Brachial 157                    triphasic                  +---------+------------------+-----+---------+-----------------+ DP       156               0.99 biphasic                   +---------+------------------+-----+---------+-----------------+ Great Toe230               1.46 Normal   Falsely elevated. +---------+------------------+-----+---------+-----------------+ +-------+-----------+-----------+------------+------------+ ABI/TBIToday's ABIToday's TBIPrevious ABIPrevious TBI +-------+-----------+-----------+------------+------------+ Right  0.99       0.73                                 +-------+-----------+-----------+------------+------------+ Left   0.99       1.46                                +-------+-----------+-----------+------------+------------+  Summary: Right: Resting right ankle-brachial index is within normal range. The right toe-brachial index is normal.  Left: Resting left ankle-brachial index is within normal range. The left toe-brachial index is abnormal.  *See table(s) above for measurements and observations.  Electronically signed by Norman Serve on 04/05/2024 at 11:32:51 AM.    Final    DG Tibia/Fibula Right Result Date: 04/04/2024 CLINICAL DATA:  Soft tissue wound, assess for osteomyelitis EXAM: RIGHT TIBIA AND FIBULA - 2 VIEW COMPARISON:  03/29/2024 FINDINGS: Frontal and lateral views of the right tibia and fibula are obtained. There is diffuse soft tissue swelling. No subcutaneous gas or radiopaque foreign body. There are no acute or destructive bony abnormalities. 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 IMPRESSION: 1. Stable diffuse soft tissue swelling. 2. No evidence of osteomyelitis. 3. Stable osteoarthritis. Electronically Signed   By: Ozell Daring M.D.   On: 04/04/2024 17:59   DG Foot Complete Left Result Date: 04/04/2024 CLINICAL DATA:  Soft tissue wound, concern for osteomyelitis EXAM: DG FOOT COMPLETE 3+V*L* COMPARISON:  03/29/2024 FINDINGS: Frontal, oblique, and lateral views of the left foot are obtained. Stable periarticular osteopenia. There are no acute displaced fractures. Multifocal osteoarthritis greatest in the midfoot. There is diffuse soft tissue edema. No subcutaneous gas or radiopaque foreign body. No bony destruction or periosteal reaction to suggest acute osteomyelitis. IMPRESSION: 1. Diffuse soft tissue swelling. No radiographic evidence of acute osteomyelitis. 2. Stable osteopenia and osteoarthritis. Electronically Signed   By: Ozell Daring M.D.   On: 04/04/2024 17:58   DG Foot Complete Right Result  Date: 04/04/2024 CLINICAL DATA:  Wound, assess for osteomyelitis EXAM: RIGHT FOOT COMPLETE - 3+ VIEW COMPARISON:  03/29/2024 FINDINGS: Frontal, oblique, lateral views of the right foot are obtained. Continue periarticular osteopenia. There are no acute displaced fractures. Multifocal osteoarthritis greatest throughout the midfoot unchanged. There is diffuse soft tissue swelling. Suspected ulceration overlying the dorsal aspect of the calcaneus. No subcutaneous gas or radiopaque foreign body. There is no bony destruction or periosteal reaction to suggest osteomyelitis.  IMPRESSION: 1. Continued diffuse soft tissue swelling of the right foot. No evidence of osteomyelitis. 2. Stable osteopenia and multifocal osteoarthritis. Electronically Signed   By: Ozell Daring M.D.   On: 04/04/2024 17:56   DG Foot Complete Right Result Date: 03/29/2024 EXAM: 3 OR MORE VIEW(S) XRAY OF THE RIGHT FOOT 03/29/2024 09:20:00 PM COMPARISON: Right foot series 10/22/2022. CLINICAL HISTORY: r/o osteo. Per pt notes: Pt reports with bilateral lower leg wounds that have been there for a year. FINDINGS: BONES AND JOINTS: Surface soft tissue ulceration again is suspected posterior to the calcaneus without underlying destructive bone lesion. Again seen is shallow chronic erosion in the medial aspect of the articulating surface of the 1st metatarsal head. This is probably degenerative. There is a similar finding in the left foot. A shallow chronic erosion is again noted in the medial base of the small toe proximal phalanx and in the adjacent medial aspect of the 5th metatarsal head. This could be degenerative etiology or chronic osteomyelitis. Elsewhere, there is nonerosive arthrosis in portions of the midfoot, and small dorsal and plantar calcaneal spurring. There is moderate arthrosis of the ankle. No other focal bone abnormality. Comparison to the prior study reveals no significant interval change. No acute fracture. No joint dislocation.  SOFT TISSUES: Moderate diffuse edema is again noted with diffuse subcutaneous dystrophic calcifications in the distal foreleg. IMPRESSION: 1. Surface ulceration suspected posterior to the calcaneus without underlying destructive bone lesion. 2. Shallow chronic erosions at the medial base of the small toe proximal phalanx and adjacent medial aspect of the 5th metatarsal head; differential includes chronic osteomyelitis versus degenerative etiology. 3. Shallow chronic erosion in the medial aspect of the 1st metatarsal head, likely degenerative. 4. Moderate diffuse edema with diffuse subcutaneous dystrophic calcifications in the distal foreleg. 5. No significant interval change compared to the prior study. Electronically signed by: Francis Quam MD 03/29/2024 10:03 PM EDT RP Workstation: [id number]   DG Foot Complete Left Result Date: 03/29/2024 EXAM: 3 OR MORE VIEW(S) XRAY OF THE LEFT FOOT 03/29/2024 09:20:00 PM COMPARISON: Left foot series 05/19/2023. CLINICAL HISTORY: r/o osteo. Per pt notes: Pt reports with bilateral lower leg wounds that have been there for a year. FINDINGS: BONES AND JOINTS: Osteopenia. No acute fracture is evident. A shallow erosion is again noted in the medial aspect of the first metatarsal head articulating surface and is unchanged, probably degenerative. Small erosions are again noted in the lateral and medial sides of the 4th metatarsal head and are unchanged in appearance, could be degenerative or from chronic osteomyelitis. Lucencies are again noted in the medial base of the small toe proximal phalanx and in the 5th metatarsal head without bony defect, probably degenerative. There are degenerative changes in the forefoot and midfoot, dorsal and plantar calcaneal spurring is unchanged. There is moderate arthrosis at the ankle. No other focal bone abnormality is seen. No joint dislocation. SOFT TISSUES: Diffuse dystrophic soft tissue calcifications are noted in the distal foreleg,  scattered dystrophic calcifications again in the dorsum of the forefoot. There is again moderate to severe generalized edema in the foot. This is greatest in the forefoot. There is no appreciable soft tissue gas. IMPRESSION: 1. No acute fracture. 2. Moderate to severe generalized edema in the foot, greatest in the forefoot. No appreciable soft tissue gas. 3. Small erosions in the lateral and medial sides of the 4th metatarsal head, unchanged; could be degenerative or from chronic osteomyelitis. 4. Overall, no noteworthy change from last year's study. Electronically signed by:  Francis Quam MD 03/29/2024 09:53 PM EDT RP Workstation: [id number]   DG Tibia/Fibula Left Result Date: 03/29/2024 EXAM: _VIEWS_ VIEW(S) XRAY OF THE LEFT TIBIA AND FIBULA 03/29/2024 09:20:00 PM COMPARISON: Left tibiofibular series 05/19/2023. CLINICAL HISTORY: r/o osteo. Per pt notes: Pt reports with bilateral lower leg wounds that have been there for a year. r/o osteo. Per pt notes: Pt reports with bilateral lower leg wounds that have been there for a year. FINDINGS: BONES AND JOINTS: There is a linear periosteal reaction along the medial distal tibial shaft, but this was seen as far back as a study dated 07/20/2019 and seems unchanged. No destructive bone lesion is evident. No acute fracture. No joint dislocation. There is advanced degenerative arthrosis of the knee joint, moderate arthrosis at the ankle. SOFT TISSUES: Mild to moderate generalized edema again noted in the mid to distal foreleg minimally improved. Diffuse dystrophic soft tissue calcifications again are noted predominating in the pretibial area. Suggested shallow surface ulceration along the medial aspect at the level of the distal calf. No appreciable soft tissue gas. IMPRESSION: 1. No acute destructive bone lesion. 2. Mild to moderate generalized edema in the mid to distal foreleg, minimally improved. 3. Suggested shallow surface ulceration along the medial aspect at the  level of the distal calf. 4. Diffuse dystrophic soft tissue calcifications, predominating in the pretibial area. Unchanged. 5. Linear periosteal reaction along the medial distal tibial shaft, unchanged since 07/20/2019. 6. Advanced degenerative arthrosis of the knee joint. Electronically signed by: Francis Quam MD 03/29/2024 09:44 PM EDT RP Workstation: [id number]   DG Tibia/Fibula Right Result Date: 03/29/2024 EXAM: 2 VIEW(S) XRAY OF THE RIGHT TIBIA AND FIBULA 03/29/2024 09:20:00 PM COMPARISON: Right tibiofibular series 05/19/2023. CLINICAL HISTORY: r/o osteo. Per pt notes: Pt reports with bilateral lower leg wounds that have been there for a year. FINDINGS: BONES AND JOINTS: There is osteopenia without evidence of fractures or destructive bone lesions. There is advanced arthrosis of the knee joint, moderate arthrosis of the ankle. No erosive arthropathy or interval changes are seen. No joint dislocation. SOFT TISSUES: Mild to moderate generalized edema is again noted in the mid to distal foreleg. There is a shallow soft tissue ulceration again seen posteriorly in the distal foreleg, level of the junction of the mid and distal thirds of the tibia. There are multifocal dystrophic soft tissue calcifications scattered throughout the foreleg, majority in the pretibial area. No soft tissue gas is seen. IMPRESSION: 1. No acute osseous abnormality. Osteopenia. 2. Shallow soft tissue ulceration posteriorly in the distal foreleg at the level of the junction of the mid and distal thirds of the tibia. 3. Mild to moderate generalized edema in the mid to distal foreleg. No appreciable change from last year. Multifocal Dystrophic soft tissue calcifications. 4. Advanced arthrosis of the knee joint. Electronically signed by: Francis Quam MD 03/29/2024 09:31 PM EDT RP Workstation: [id number]     Subjective: - no chest pain, shortness of breath, no abdominal pain, nausea or vomiting.   Discharge Exam: BP 115/61 (BP  Location: Left Arm)   Pulse 60   Temp (!) 97.4 F (36.3 C)   Resp 16   Ht 5' 4 (1.626 m)   Wt 91.6 kg   SpO2 100%   BMI 34.66 kg/m   General: Pt is alert, awake, not in acute distress Cardiovascular: RRR, S1/S2 +, no rubs, no gallops Respiratory: CTA bilaterally, no wheezing, no rhonchi Abdominal: Soft, NT, ND, bowel sounds + Extremities: no edema, no cyanosis  The results of significant diagnostics from this hospitalization (including imaging, microbiology, ancillary and laboratory) are listed below for reference.     Microbiology: Recent Results (from the past 240 hours)  Culture, blood (Routine x 2)     Status: None   Collection Time: 04/04/24  4:38 PM   Specimen: BLOOD  Result Value Ref Range Status   Specimen Description BLOOD SITE NOT SPECIFIED  Final   Special Requests   Final    BOTTLES DRAWN AEROBIC AND ANAEROBIC Blood Culture results may not be optimal due to an inadequate volume of blood received in culture bottles   Culture   Final    NO GROWTH 5 DAYS Performed at [facility], [address] [place], [place] [address]    Report Status 04/09/2024 FINAL  Final  Culture, blood (Routine x 2)     Status: None   Collection Time: 04/04/24  7:29 PM   Specimen: BLOOD RIGHT ARM  Result Value Ref Range Status   Specimen Description BLOOD RIGHT ARM  Final   Special Requests   Final    BOTTLES DRAWN AEROBIC AND ANAEROBIC Blood Culture results may not be optimal due to an inadequate volume of blood received in culture bottles   Culture   Final    NO GROWTH 5 DAYS Performed at [facility], [address] [place], [place] [address]    Report Status 04/09/2024 FINAL  Final  Surgical PCR screen     Status: None   Collection Time: 04/08/24  4:50 AM   Specimen: Nasal Mucosa; Nasal Swab  Result Value Ref Range Status   MRSA, PCR NEGATIVE NEGATIVE Final   Staphylococcus aureus NEGATIVE NEGATIVE Final    Comment: (NOTE) The Xpert SA Assay (FDA approved  for NASAL specimens in patients 90 years of age and older), is one component of a comprehensive surveillance program. It is not intended to diagnose infection nor to guide or monitor treatment. Performed at [facility], [address], [place], [place] [address]   Aerobic/Anaerobic Culture w Gram Stain (surgical/deep wound)     Status: None (Preliminary result)   Collection Time: 04/08/24  3:12 PM   Specimen: Wound; Tissue  Result Value Ref Range Status   Specimen Description TISSUE RIGHT FOOT  Final   Special Requests NONE  Final   Gram Stain   Final    FEW WBC PRESENT,BOTH PMN AND MONONUCLEAR MODERATE GRAM POSITIVE RODS RARE GRAM POSITIVE COCCI IN PAIRS RARE GRAM NEGATIVE RODS Performed at [facility], [address], [place], [place] [address]    Culture   Final    ABUNDANT CITROBACTER KOSERI CULTURE REINCUBATED FOR BETTER GROWTH FEW MORGANELLA MORGANII NO ANAEROBES ISOLATED; CULTURE IN PROGRESS FOR 5 DAYS    Report Status PENDING  Incomplete   Organism ID, Bacteria CITROBACTER KOSERI  Final   Organism ID, Bacteria MORGANELLA MORGANII  Final      Susceptibility   Citrobacter koseri - MIC*    CEFEPIME  <=0.12 SENSITIVE Sensitive     ERTAPENEM <=0.12 SENSITIVE Sensitive     CEFTRIAXONE  <=0.25 SENSITIVE Sensitive     CIPROFLOXACIN  <=0.06 SENSITIVE Sensitive     GENTAMICIN <=1 SENSITIVE Sensitive     MEROPENEM <=0.25 SENSITIVE Sensitive     TRIMETH/SULFA <=20 SENSITIVE Sensitive     PIP/TAZO Value in next row Sensitive      <=4 SENSITIVEThis is a modified FDA-approved test that has been validated and its performance characteristics determined by  the reporting laboratory.  This laboratory is certified under the Clinical Laboratory Improvement Amendments CLIA as qualified to perform high complexity clinical laboratory testing.    * ABUNDANT CITROBACTER KOSERI   Morganella morganii - MIC*    AMPICILLIN  Value in next row Resistant      <=4 SENSITIVEThis is a  modified FDA-approved test that has been validated and its performance characteristics determined by the reporting laboratory.  This laboratory is certified under the Clinical Laboratory Improvement Amendments CLIA as qualified to perform high complexity clinical laboratory testing.    ERTAPENEM Value in next row Sensitive      <=4 SENSITIVEThis is a modified FDA-approved test that has been validated and its performance characteristics determined by the reporting laboratory.  This laboratory is certified under the Clinical Laboratory Improvement Amendments CLIA as qualified to perform high complexity clinical laboratory testing.    CIPROFLOXACIN  Value in next row Sensitive      <=4 SENSITIVEThis is a modified FDA-approved test that has been validated and its performance characteristics determined by the reporting laboratory.  This laboratory is certified under the Clinical Laboratory Improvement Amendments CLIA as qualified to perform high complexity clinical laboratory testing.    GENTAMICIN Value in next row Sensitive      <=4 SENSITIVEThis is a modified FDA-approved test that has been validated and its performance characteristics determined by the reporting laboratory.  This laboratory is certified under the Clinical Laboratory Improvement Amendments CLIA as qualified to perform high complexity clinical laboratory testing.    MEROPENEM Value in next row Sensitive      <=4 SENSITIVEThis is a modified FDA-approved test that has been validated and its performance characteristics determined by the reporting laboratory.  This laboratory is certified under the Clinical Laboratory Improvement Amendments CLIA as qualified to perform high complexity clinical laboratory testing.    TRIMETH/SULFA Value in next row Sensitive      <=4 SENSITIVEThis is a modified FDA-approved test that has been validated and its performance characteristics determined by the reporting laboratory.  This laboratory is certified under  the Clinical Laboratory Improvement Amendments CLIA as qualified to perform high complexity clinical laboratory testing.    AMPICILLIN /SULBACTAM Value in next row Intermediate      <=4 SENSITIVEThis is a modified FDA-approved test that has been validated and its performance characteristics determined by the reporting laboratory.  This laboratory is certified under the Clinical Laboratory Improvement Amendments CLIA as qualified to perform high complexity clinical laboratory testing.    PIP/TAZO Value in next row Sensitive      <=4 SENSITIVEThis is a modified FDA-approved test that has been validated and its performance characteristics determined by the reporting laboratory.  This laboratory is certified under the Clinical Laboratory Improvement Amendments CLIA as qualified to perform high complexity clinical laboratory testing.    * FEW MORGANELLA MORGANII  Aerobic/Anaerobic Culture w Gram Stain (surgical/deep wound)     Status: None (Preliminary result)   Collection Time: 04/08/24  3:17 PM   Specimen: Wound; Tissue  Result Value Ref Range Status   Specimen Description TISSUE LEFT FOOT  Final   Special Requests NONE  Final   Gram Stain   Final    FEW WBC PRESENT,BOTH PMN AND MONONUCLEAR MODERATE GRAM POSITIVE RODS RARE GRAM POSITIVE COCCI IN PAIRS RARE GRAM NEGATIVE RODS Performed at [facility], [address], [place], [place] [address]    Culture   Final    FEW CITROBACTER FREUNDII FEW CITROBACTER KOSERI NO ANAEROBES ISOLATED; CULTURE  IN PROGRESS FOR 5 DAYS    Report Status PENDING  Incomplete   Organism ID, Bacteria CITROBACTER FREUNDII  Final   Organism ID, Bacteria CITROBACTER KOSERI  Final      Susceptibility   Citrobacter freundii - MIC*    CEFEPIME  <=0.12 SENSITIVE Sensitive     ERTAPENEM <=0.12 SENSITIVE Sensitive     CEFTRIAXONE  <=0.25 SENSITIVE Sensitive     CIPROFLOXACIN  <=0.06 SENSITIVE Sensitive     GENTAMICIN <=1 SENSITIVE Sensitive     MEROPENEM <=0.25  SENSITIVE Sensitive     TRIMETH/SULFA <=20 SENSITIVE Sensitive     PIP/TAZO Value in next row Sensitive      <=4 SENSITIVEThis is a modified FDA-approved test that has been validated and its performance characteristics determined by the reporting laboratory.  This laboratory is certified under the Clinical Laboratory Improvement Amendments CLIA as qualified to perform high complexity clinical laboratory testing.    * FEW CITROBACTER FREUNDII   Citrobacter koseri - MIC*    CEFEPIME  Value in next row Sensitive      <=4 SENSITIVEThis is a modified FDA-approved test that has been validated and its performance characteristics determined by the reporting laboratory.  This laboratory is certified under the Clinical Laboratory Improvement Amendments CLIA as qualified to perform high complexity clinical laboratory testing.    ERTAPENEM Value in next row Sensitive      <=4 SENSITIVEThis is a modified FDA-approved test that has been validated and its performance characteristics determined by the reporting laboratory.  This laboratory is certified under the Clinical Laboratory Improvement Amendments CLIA as qualified to perform high complexity clinical laboratory testing.    CEFTRIAXONE  Value in next row Sensitive      <=4 SENSITIVEThis is a modified FDA-approved test that has been validated and its performance characteristics determined by the reporting laboratory.  This laboratory is certified under the Clinical Laboratory Improvement Amendments CLIA as qualified to perform high complexity clinical laboratory testing.    CIPROFLOXACIN  Value in next row Sensitive      <=4 SENSITIVEThis is a modified FDA-approved test that has been validated and its performance characteristics determined by the reporting laboratory.  This laboratory is certified under the Clinical Laboratory Improvement Amendments CLIA as qualified to perform high complexity clinical laboratory testing.    GENTAMICIN Value in next row Sensitive       <=4 SENSITIVEThis is a modified FDA-approved test that has been validated and its performance characteristics determined by the reporting laboratory.  This laboratory is certified under the Clinical Laboratory Improvement Amendments CLIA as qualified to perform high complexity clinical laboratory testing.    MEROPENEM Value in next row Sensitive      <=4 SENSITIVEThis is a modified FDA-approved test that has been validated and its performance characteristics determined by the reporting laboratory.  This laboratory is certified under the Clinical Laboratory Improvement Amendments CLIA as qualified to perform high complexity clinical laboratory testing.    TRIMETH/SULFA Value in next row Sensitive      <=4 SENSITIVEThis is a modified FDA-approved test that has been validated and its performance characteristics determined by the reporting laboratory.  This laboratory is certified under the Clinical Laboratory Improvement Amendments CLIA as qualified to perform high complexity clinical laboratory testing.    PIP/TAZO Value in next row Sensitive      <=4 SENSITIVEThis is a modified FDA-approved test that has been validated and its performance characteristics determined by the reporting laboratory.  This laboratory is certified under the Clinical Laboratory Improvement Amendments CLIA  as qualified to perform high complexity clinical laboratory testing.    * FEW CITROBACTER KOSERI     Labs: Basic Metabolic Panel: Recent Labs  Lab 04/07/24 0300 04/09/24 0151 04/11/24 0627  NA 139 137 137  K 4.2 4.2 4.0  CL 107 106 107  CO2 23 25 21*  GLUCOSE 115* 135* 145*  BUN 21 17 22   CREATININE 0.96 0.87 1.03*  CALCIUM  9.0 8.6* 9.1  MG 1.9 1.9 1.9   Liver Function Tests: Recent Labs  Lab 04/07/24 0300 04/09/24 0151 04/11/24 0627  AST 11* 37 26  ALT 11 25 27   ALKPHOS 88 67 81  BILITOT 0.4 0.2 0.5  PROT 7.3 7.0 7.6  ALBUMIN 2.3* 2.3* 2.4*   CBC: Recent Labs  Lab 04/07/24 0300 04/09/24 0151  04/11/24 0627  WBC 5.7 5.7 6.5  HGB 10.7* 9.8* 10.3*  HCT 35.0* 32.0* 32.9*  MCV 85.8 86.7 85.2  PLT 350 318 290   CBG: Recent Labs  Lab 04/11/24 0841 04/11/24 1151 04/11/24 1717 04/11/24 2035 04/12/24 0819  GLUCAP 116* 196* 116* 127* 107*   Hgb A1c No results for input(s): HGBA1C in the last 72 hours. Lipid Profile No results for input(s): CHOL, HDL, LDLCALC, TRIG, CHOLHDL, LDLDIRECT in the last 72 hours. Thyroid  function studies No results for input(s): TSH, T4TOTAL, T3FREE, THYROIDAB in the last 72 hours.  Invalid input(s): FREET3 Urinalysis    Component Value Date/Time   COLORURINE COLORLESS (A) 04/04/2024 1620   APPEARANCEUR CLEAR 04/04/2024 1620   LABSPEC 1.003 (L) 04/04/2024 1620   PHURINE 7.0 04/04/2024 1620   GLUCOSEU NEGATIVE 04/04/2024 1620   HGBUR NEGATIVE 04/04/2024 1620   BILIRUBINUR NEGATIVE 04/04/2024 1620   KETONESUR NEGATIVE 04/04/2024 1620   PROTEINUR NEGATIVE 04/04/2024 1620   NITRITE NEGATIVE 04/04/2024 1620   LEUKOCYTESUR NEGATIVE 04/04/2024 1620    FURTHER DISCHARGE INSTRUCTIONS:   Get Medicines reviewed and adjusted: Please take all your medications with you for your next visit with your Primary MD   Laboratory/radiological data: Please request your Primary MD to go over all hospital tests and procedure/radiological results at the follow up, please ask your Primary MD to get all Hospital records sent to his/her office.   In some cases, they will be blood work, cultures and biopsy results pending at the time of your discharge. Please request that your primary care M.D. goes through all the records of your hospital data and follows up on these results.   Also Note the following: If you experience worsening of your admission symptoms, develop shortness of breath, life threatening emergency, suicidal or homicidal thoughts you must seek medical attention immediately by calling 911 or calling your MD immediately  if  symptoms less severe.   You must read complete instructions/literature along with all the possible adverse reactions/side effects for all the Medicines you take and that have been prescribed to you. Take any new Medicines after you have completely understood and accpet all the possible adverse reactions/side effects.    Do not drive when taking Pain medications or sleeping medications (Benzodaizepines)   Do not take more than prescribed Pain, Sleep and Anxiety Medications. It is not advisable to combine anxiety,sleep and pain medications without talking with your primary care practitioner   Special Instructions: If you have smoked or chewed Tobacco  in the last 2 yrs please stop smoking, stop any regular Alcohol  and or any Recreational drug use.   Wear Seat belts while driving.   Please note: You were cared for  by a hospitalist during your hospital stay. Once you are discharged, your primary care physician will handle any further medical issues. Please note that NO REFILLS for any discharge medications will be authorized once you are discharged, as it is imperative that you return to your primary care physician (or establish a relationship with a primary care physician if you do not have one) for your post hospital discharge needs so that they can reassess your need for medications and monitor your lab values.  Time coordinating discharge: 35 minutes  SIGNED:  Nilda Fendt, MD, PhD 04/12/2024, 9:22 AM

## 2024-04-12 NOTE — Plan of Care (Signed)
   Problem: Clinical Measurements: Goal: Will remain free from infection Outcome: Progressing   Problem: Pain Managment: Goal: General experience of comfort will improve and/or be controlled Outcome: Progressing   Problem: Safety: Goal: Ability to remain free from injury will improve Outcome: Progressing

## 2024-04-12 NOTE — Progress Notes (Signed)
 Attempted to switch the patient to home wound vacs. Vacs worked for a few minutes and began to ring out indicating an air leak. I assessed the dressing and see no signs of a leak in the dressing or holes. I tried switching the machines and was still having the same issue. Reconnected the patient to impatient wound vacs to perform a seal check with no indication of a leak.

## 2024-04-12 NOTE — TOC Progression Note (Signed)
 Transition of Care (TOC) - Progression Note   Patient discharged today.   Patient reports husband will provide transportation home.   Bedside nurse will provide prevena VAC's   Kelly with [facility] aware patient is discharged  Patient Details  Name: [name] MRN: [id number] Date of Birth: 12/13/1950  Transition of Care [facility]) CM/SW Contact  Cathyrn Deas, Powell Jansky, RN Phone Number: 04/12/2024, 9:39 AM  Clinical Narrative:       Expected Discharge Plan: Home w Home Health Services Barriers to Discharge: Continued Medical Work up               Expected Discharge Plan and Services   Discharge Planning Services: CM Consult Post Acute Care Choice: Home Health Living arrangements for the past 2 months: Apartment Expected Discharge Date: 04/12/24               DME Arranged: N/A DME Agency: NA       HH Arranged: PT, RN HH Agency: [facility] Date HH Agency Contacted: 04/11/24 Time HH Agency Contacted: 1046 Representative spoke with at [facility] Agency: [place]   Social Drivers of Health (SDOH) Interventions SDOH Screenings   Food Insecurity: No Food Insecurity (04/05/2024)  Housing: Low Risk  (04/05/2024)  Transportation Needs: No Transportation Needs (04/05/2024)  Utilities: Not At Risk (04/05/2024)  Social Connections: Moderately Isolated (04/05/2024)  Tobacco Use: Low Risk  (04/08/2024)    Readmission Risk Interventions     No data to display

## 2024-04-12 NOTE — Care Management Important Message (Signed)
 Important Message  Patient Details  Name: [name] MRN: [id number] Date of Birth: [birth year]   Important Message Given:  Yes - Medicare IM     Jon Cruel 04/12/2024, 3:39 PM

## 2024-04-12 NOTE — Progress Notes (Signed)
 Primary Nurse reviewed AVS, patient expressed understanding of medications, MD follow up reviewed.    Patient states all belongings brought to the hospital at time of admission are accounted for and packed to take home.   Patient informed to pick up medications from  [facility] pharmacy.  Pt transported to entrance A by this nurse where family member was waiting in vehicle to transport home.

## 2024-04-12 NOTE — Progress Notes (Signed)
 Patient refused PIV insertion, saying that she will be going home tomorrow.Provider made aware.

## 2024-04-13 LAB — AEROBIC/ANAEROBIC CULTURE W GRAM STAIN (SURGICAL/DEEP WOUND)

## 2024-04-14 ENCOUNTER — Telehealth: Payer: Self-pay | Admitting: Orthopedic Surgery

## 2024-04-14 ENCOUNTER — Ambulatory Visit (INDEPENDENT_AMBULATORY_CARE_PROVIDER_SITE_OTHER): Admitting: Physician Assistant

## 2024-04-14 ENCOUNTER — Encounter: Payer: Self-pay | Admitting: Physician Assistant

## 2024-04-14 DIAGNOSIS — E11622 Type 2 diabetes mellitus with other skin ulcer: Secondary | ICD-10-CM

## 2024-04-14 DIAGNOSIS — L97929 Non-pressure chronic ulcer of unspecified part of left lower leg with unspecified severity: Secondary | ICD-10-CM

## 2024-04-14 NOTE — Telephone Encounter (Signed)
 Pt has wound vac on, once we remove we will discuss with pt and send in new referral for nursing.

## 2024-04-14 NOTE — Telephone Encounter (Signed)
 Sandy (Nursing) called stating per pt would not like to be admitted at this time. Sandy secure number is [phone].

## 2024-04-14 NOTE — Progress Notes (Signed)
 Office Visit Note   Patient: [name]           Date of Birth: 1950/10/29           MRN: [id number] Visit Date: 04/14/2024              Requested by: Health, [facility] [address] [place],  [place] [address] PCP: Health, [facility]  Chief Complaint  Patient presents with   Left Leg - Routine Post Op    04/08/24 I&D bilateral calf   Right Leg - Routine Post Op      HPI: [name] is a  73 y.o. female with a diagnosis of Ulcers Bilateral Ankles who failed conservative measures and elected for surgical management.  She is s/p Excisional debridement right ankle with excision of skin and soft tissue muscle and fascia.  Her wound vac was no longer working.  She has not ambulated in 20 years.    Assessment & Plan: Visit Diagnoses:  1. Diabetic ulcer of left lower leg (HCC)     Plan: Keep Vac in place, charge the vac as instructed  Follow-Up Instructions: Return in about 1 week (around 04/21/2024).   Ortho Exam  Patient is alert, oriented, no adenopathy, well-dressed, normal affect, normal respiratory effort. Vac system was out of battery.  The vac was charged in the office, and the vac dressing was re enforced with Ioban.      Imaging: No results found. No images are attached to the encounter.  Labs: Lab Results  Component Value Date   HGBA1C 6.6 (H) 04/06/2024   HGBA1C 6.4 (H) 06/09/2023   HGBA1C 8.1 (H) 10/23/2022   ESRSEDRATE 110 (H) 05/20/2023   ESRSEDRATE 90 (H) 10/23/2022   ESRSEDRATE 117 (H) 03/06/2021   CRP 4.7 (H) 05/20/2023   CRP 3.8 (H) 10/23/2022   CRP 43.7 (H) 03/06/2021   LABURIC 7.7 (H) 05/20/2023   REPTSTATUS 04/13/2024 FINAL 04/08/2024   GRAMSTAIN  04/08/2024    FEW WBC PRESENT,BOTH PMN AND MONONUCLEAR MODERATE GRAM POSITIVE RODS RARE GRAM POSITIVE COCCI IN PAIRS RARE GRAM NEGATIVE RODS    CULT  04/08/2024    FEW CITROBACTER FREUNDII FEW CITROBACTER KOSERI NO ANAEROBES ISOLATED WITHIN MIXED ORGANISMS Performed at [facility], [address], [place], [place] [address]    IDOLINA CAIRO FREUNDII 04/08/2024   LABORGA CITROBACTER KOSERI 04/08/2024     Lab Results  Component Value Date   ALBUMIN 2.4 (L) 04/11/2024   ALBUMIN 2.3 (L) 04/09/2024   ALBUMIN 2.3 (L) 04/07/2024   PREALBUMIN 12.5 (L) 03/16/2017    Lab Results  Component Value Date   MG 1.9 04/11/2024   MG 1.9 04/09/2024   MG 1.9 04/07/2024   No results found for: [facility]  Lab Results  Component Value Date   PREALBUMIN 12.5 (L) 03/16/2017      Latest Ref Rng & Units 04/11/2024    6:27 AM 04/09/2024    1:51 AM 04/07/2024    3:00 AM  CBC EXTENDED  WBC 4.0 - 10.5 K/uL 6.5  5.7  5.7   RBC 3.87 - 5.11 MIL/uL 3.86  3.69  4.08   Hemoglobin 12.0 - 15.0 g/dL 89.6  9.8  89.2   HCT 63.9 - 46.0 % 32.9  32.0  35.0   Platelets 150 - 400 K/uL 290  318  350      There is no height or weight on file to calculate BMI.  Orders:  No orders of  the defined types were placed in this encounter.  No orders of the defined types were placed in this encounter.    Procedures: No procedures performed  Clinical Data: No additional findings.  ROS:  All other systems negative, except as noted in the HPI. Review of Systems  Objective: Vital Signs: There were no vitals taken for this visit.  Specialty Comments:  No specialty comments available.  PMFS History: Patient Active Problem List   Diagnosis Date Noted   Cutaneous abscess of left foot 04/08/2024   Open wound of both lower extremities 04/05/2024   Venous stasis ulcer (HCC) 04/04/2024   Seizure (HCC) 06/10/2023   Stroke-like symptoms 06/09/2023   Complex partial seizure (HCC) 06/09/2023   Ulcer of extremity due to chronic venous insufficiency (HCC) 02/19/2023   Lymphedema 02/19/2023   SBO (small bowel obstruction) (HCC) 02/18/2023   GAD (generalized anxiety disorder) 02/18/2023   Asthma, chronic 02/18/2023   Skin ulcer due to diabetes mellitus (HCC) 11/11/2022    Encounter for change or removal of nonsurgical wound dressing 11/02/2022   Type 2 diabetes mellitus with diabetic peripheral angiopathy without gangrene (HCC) 11/02/2022   Non-prs chronic ulcer oth prt l low leg w fat layer exposed (HCC) 11/02/2022   Diabetic ulcer of left lower leg (HCC) 10/24/2022   Chronic venous hypertension (idiopathic) with ulcer and inflammation of bilateral lower extremity (HCC) 10/24/2022   Non-healing wound of lower extremity 10/23/2022   Bipolar affective disorder, currently depressed, moderate (HCC) 09/02/2022   Peripheral vascular disorder due to diabetes mellitus (HCC) 09/02/2022   Alzheimer's disease (HCC) 07/14/2022   Cramps of lower extremity 07/14/2022   Other specified diabetes mellitus with diabetic polyneuropathy (HCC) 07/14/2022   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/14/2022   Type 2 diabetes mellitus with peripheral angiopathy (HCC) 07/14/2022   Hyperglycemia due to type 2 diabetes mellitus (HCC) 07/14/2022   Bipolar disorder (HCC) 06/30/2022   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) 06/30/2022   Morbid (severe) obesity due to excess calories (HCC) 06/30/2022   Bacteria in urine 06/26/2022   Stasis ulcer of lower extremity (HCC) 06/26/2022   Elevated troponin 06/25/2022   Abnormal urinalysis 06/25/2022   History of CVA (cerebrovascular accident) 06/25/2022   Memory loss 06/25/2022   Unspecified dementia, unspecified severity, with anxiety (HCC) 06/16/2022   History of falling 06/16/2022   Long term (current) use of insulin  (HCC) 06/16/2022   Other chronic pain 06/16/2022   Body mass index (BMI) 50.0-59.9, adult (HCC) 06/16/2022   Cryptogenic stroke (HCC) 06/11/2022   Middle cerebral artery embolism, 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   Non-adherence to medical treatment 03/15/2022   Chronic venous insufficiency 03/15/2022   DM (diabetes mellitus), type 2 (HCC) 03/15/2022   Stasis dermatitis of right lower extremity  with venous ulcer due to chronic peripheral venous hypertension (HCC) 03/15/2022   Venous stasis ulcers of both lower extremities (HCC) 03/15/2022   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   Wound infection 02/05/2021   Venous stasis dermatitis of both lower extremities    Pneumonia due to COVID-19 virus 06/29/2019   Hypophosphatemia 06/24/2019   Obesity 06/23/2019   Insulin  dependent type 2 diabetes mellitus (HCC) 06/22/2019   Anxiety 06/22/2019   Essential hypertension 06/22/2019   Hyperlipidemia 06/22/2019   COVID-19 06/21/2019   Severe protein-calorie malnutrition 06/21/2019   Acute respiratory failure with hypoxia (HCC) 06/21/2019   Diabetic foot ulcer associated with type 2 diabetes mellitus (HCC)    Screening for cervical cancer 03/15/2017  Peripheral edema 09/09/2016   Venous stasis ulcer of left calf limited to breakdown of skin without varicose veins (HCC) 07/23/2016   Abnormal mammogram 07/16/2016   Cellulitis 07/05/2016   Anxiety and depression 05/30/2016   Lower extremity ulceration (HCC) 05/30/2016   Generalized weakness 05/30/2016   Left arm weakness 05/30/2016   Muscle spasm of both lower legs 05/19/2016   Neuropathy 05/19/2016   Sciatica of right side 05/19/2016   Diabetes mellitus (HCC) 05/19/2016   Left Lower extremity pain  04/02/2016   Lower extremity pain, inferior, left 04/02/2016   Left leg pain    Personal history of noncompliance with medical treatment, presenting hazards to health 03/31/2016   Jaw pain 03/27/2016   Left leg cellulitis 03/24/2016   Cellulitis of left lower extremity    Primary insomnia    Adjustment disorder    Morbid obesity (HCC) 01/27/2016   Wheelchair dependent 01/27/2016   Gout 01/27/2016   Arthritis 01/27/2016   Asthma 01/27/2016   Uncontrolled type 2 diabetes mellitus with hypoglycemia, with long-term current use of insulin  (HCC) 01/25/2016   Cellulitis of left leg 01/11/2016   Insulin -requiring or dependent type  II diabetes mellitus (HCC) 01/11/2016   Hypertension 01/11/2016   Past Medical History:  Diagnosis Date   Anxiety    Arthritis    back, arms, legs (03/24/2016)   Asthma    Chronic lower back pain    Colonic polyp    last colonoscopy done in 2009 with normal results per medical record   Depressive disorder    Gastric polyp    Gout    has taken allopurinol  300mg  once daily in past   Headache    History of hiatal hernia    Hypertension    Migraine    none in awhile; might have a couple/year (03/24/2016)   Mixed hyperlipidemia    01/2016 Total chol 141, HDL 59, LDL 63, ration 1.1   Osteoarthritis    TIA (transient ischemic attack) 11/2014   Type II diabetes mellitus (HCC)     Family History  Problem Relation Age of Onset   Stroke Father    Stroke Brother    Cancer Other    Gout Mother    Hypertension Mother    Arthritis Mother    Asthma Mother    Stroke Brother     Past Surgical History:  Procedure Laterality Date   APPLICATION OF WOUND VAC Bilateral 04/08/2024   Procedure: APPLICATION, WOUND VAC;  Surgeon: Harden Jerona GAILS, MD;  Location: [facility] OR;  Service: Orthopedics;  Laterality: Bilateral;   ARTERY BIOPSY Right 08/12/2016   Procedure: BIOPSY TEMPORAL ARTERY;  Surgeon: Carlin FORBES Haddock, MD;  Location: [facility] OR;  Service: Vascular;  Laterality: Right;  BIOPSY TEMPORAL ARTERY   BREAST BIOPSY Left ~ 2015   benign   CARPAL TUNNEL RELEASE Bilateral    DILATION AND CURETTAGE OF UTERUS     I & D EXTREMITY Bilateral 10/24/2022   Procedure: DEBRIDEMENT LEFT LEG AND RIGHT ANKLE;  Surgeon: Harden Jerona GAILS, MD;  Location: [facility] OR;  Service: Orthopedics;  Laterality: Bilateral;   INCISION AND DRAINAGE OF DEEP ABSCESS, CALF Bilateral 04/08/2024   Procedure: INCISION AND DRAINAGE OF DEEP ABSCESS, CALF;  Surgeon: Harden Jerona GAILS, MD;  Location: [facility] OR;  Service: Orthopedics;  Laterality: Bilateral;  BILATERAL LEG DEBRIDEMENT   IR CT HEAD LTD  06/11/2022   IR PERCUTANEOUS ART  THROMBECTOMY/INFUSION INTRACRANIAL INC DIAG ANGIO  06/11/2022   IR US  GUIDE VASC ACCESS RIGHT  06/11/2022   KNEE ARTHROSCOPY Right 2003   in [facility]   LAPAROSCOPIC CHOLECYSTECTOMY     LOOP RECORDER INSERTION N/A 06/26/2022   Procedure: LOOP RECORDER INSERTION;  Surgeon: Inocencio Soyla Lunger, MD;  Location: [facility] INVASIVE CV LAB;  Service: Cardiovascular;  Laterality: N/A;   RADIOLOGY WITH ANESTHESIA N/A 06/11/2022   Procedure: IR WITH ANESTHESIA;  Surgeon: Radiologist, Medication, MD;  Location: [facility] OR;  Service: Radiology;  Laterality: N/A;   TUBAL LIGATION     VAGINAL HYSTERECTOMY  1982   Social History   Occupational History   Not on file  Tobacco Use   Smoking status: Never   Smokeless tobacco: Never  Vaping Use   Vaping status: Never Used  Substance and Sexual Activity   Alcohol use: No   Drug use: No   Sexual activity: Never

## 2024-04-14 NOTE — Telephone Encounter (Signed)
 Patient has appointment this morning. I will let Dr. Harden know that she is declining [facility] nursing.

## 2024-04-18 ENCOUNTER — Encounter: Payer: Self-pay | Admitting: Radiology

## 2024-04-21 ENCOUNTER — Encounter: Admitting: Orthopedic Surgery

## 2024-04-26 ENCOUNTER — Encounter: Payer: Self-pay | Admitting: Orthopedic Surgery

## 2024-04-26 ENCOUNTER — Ambulatory Visit: Admitting: Orthopedic Surgery

## 2024-04-26 DIAGNOSIS — I890 Lymphedema, not elsewhere classified: Secondary | ICD-10-CM

## 2024-04-26 DIAGNOSIS — E08621 Diabetes mellitus due to underlying condition with foot ulcer: Secondary | ICD-10-CM

## 2024-04-26 DIAGNOSIS — E11622 Type 2 diabetes mellitus with other skin ulcer: Secondary | ICD-10-CM

## 2024-04-26 DIAGNOSIS — L97901 Non-pressure chronic ulcer of unspecified part of unspecified lower leg limited to breakdown of skin: Secondary | ICD-10-CM

## 2024-04-26 DIAGNOSIS — L97929 Non-pressure chronic ulcer of unspecified part of left lower leg with unspecified severity: Secondary | ICD-10-CM

## 2024-04-26 DIAGNOSIS — L97419 Non-pressure chronic ulcer of right heel and midfoot with unspecified severity: Secondary | ICD-10-CM

## 2024-04-26 NOTE — Progress Notes (Signed)
 Office Visit Note   Patient: [name]           Date of Birth: [birth year]           MRN: [id number] Visit Date: 04/26/2024              Requested by: Health, [facility] [address] [place],  [place] [address] PCP: Health, [facility]  Chief Complaint  Patient presents with   Right Leg - Routine Post Op     04/08/24 I&D bilateral calf     Left Leg - Routine Post Op      HPI: Discussed the use of AI scribe software for clinical note transcription with the patient, who gave verbal consent to proceed.  History of Present Illness [name] is a 73 year old female who presents for follow-up of bilateral foot wounds.  The right foot wound has significantly improved, measuring thirteen by four centimeters. The left foot is almost healed, with two wounds each measuring three by one centimeter.  She cleans the wounds with soap and water and has stopped using Betadine . For dressing changes, she uses Vosh by dampening gauze with it after cleaning the wounds, then wraps with an Ace Wrap.     Assessment & Plan: Visit Diagnoses:  1. Ulcer of lower extremity, limited to breakdown of skin, unspecified laterality (HCC)   2. Diabetic ulcer of left lower leg (HCC)   3. Lymphedema   4. Diabetic ulcer of right midfoot associated with diabetes mellitus due to underlying condition, unspecified ulcer stage (HCC)     Plan: Assessment and Plan Assessment & Plan Non-pressure chronic ulcers of bilateral feet (right heel/midfoot and left heel/midfoot) Chronic ulcers improving with granulation tissue. No cellulitis, odor, or drainage. Right foot ulcer 13x4 cm, left foot two ulcers 3x1 cm each. - Clean wounds with soap and water. - Apply Vosh dressing after cleaning and drying. - Wrap with Ace Wrap after Vosh dressing. - Change dressings daily. - Elevate legs. - Follow-up in two weeks.      Follow-Up Instructions: Return in about 2 weeks (around 05/10/2024).   Ortho  Exam  Patient is alert, oriented, no adenopathy, well-dressed, normal affect, normal respiratory effort. Physical Exam EXTREMITIES: Improved granulation tissue in wounds of both feet. No cellulitis, odor, or drainage.      Imaging: No results found. No images are attached to the encounter.  Labs: Lab Results  Component Value Date   HGBA1C 6.6 (H) 04/06/2024   HGBA1C 6.4 (H) 06/09/2023   HGBA1C 8.1 (H) 10/23/2022   ESRSEDRATE 110 (H) 05/20/2023   ESRSEDRATE 90 (H) 10/23/2022   ESRSEDRATE 117 (H) 03/06/2021   CRP 4.7 (H) 05/20/2023   CRP 3.8 (H) 10/23/2022   CRP 43.7 (H) 03/06/2021   LABURIC 7.7 (H) 05/20/2023   REPTSTATUS 04/13/2024 FINAL 04/08/2024   GRAMSTAIN  04/08/2024    FEW WBC PRESENT,BOTH PMN AND MONONUCLEAR MODERATE GRAM POSITIVE RODS RARE GRAM POSITIVE COCCI IN PAIRS RARE GRAM NEGATIVE RODS    CULT  04/08/2024    FEW CITROBACTER FREUNDII FEW CITROBACTER KOSERI NO ANAEROBES ISOLATED WITHIN MIXED ORGANISMS Performed at [facility], [address] [place], [place] [address]    IDOLINA CAIRO FREUNDII 04/08/2024   LABORGA CITROBACTER KOSERI 04/08/2024     Lab Results  Component Value Date   ALBUMIN 2.4 (L) 04/11/2024   ALBUMIN 2.3 (L) 04/09/2024   ALBUMIN 2.3 (L) 04/07/2024   PREALBUMIN 12.5 (L) 03/16/2017    Lab  Results  Component Value Date   MG 1.9 04/11/2024   MG 1.9 04/09/2024   MG 1.9 04/07/2024   No results found for: [facility]  Lab Results  Component Value Date   PREALBUMIN 12.5 (L) 03/16/2017      Latest Ref Rng & Units 04/11/2024    6:27 AM 04/09/2024    1:51 AM 04/07/2024    3:00 AM  CBC EXTENDED  WBC 4.0 - 10.5 K/uL 6.5  5.7  5.7   RBC 3.87 - 5.11 MIL/uL 3.86  3.69  4.08   Hemoglobin 12.0 - 15.0 g/dL 89.6  9.8  89.2   HCT 63.9 - 46.0 % 32.9  32.0  35.0   Platelets 150 - 400 K/uL 290  318  350      There is no height or weight on file to calculate BMI.  Orders:  No orders of the defined types were placed in  this encounter.  No orders of the defined types were placed in this encounter.    Procedures: No procedures performed  Clinical Data: No additional findings.  ROS:  All other systems negative, except as noted in the HPI. Review of Systems  Objective: Vital Signs: There were no vitals taken for this visit.  Specialty Comments:  No specialty comments available.  PMFS History: Patient Active Problem List   Diagnosis Date Noted   Cutaneous abscess of left foot 04/08/2024   Open wound of both lower extremities 04/05/2024   Venous stasis ulcer (HCC) 04/04/2024   Seizure (HCC) 06/10/2023   Stroke-like symptoms 06/09/2023   Complex partial seizure (HCC) 06/09/2023   Ulcer of extremity due to chronic venous insufficiency (HCC) 02/19/2023   Lymphedema 02/19/2023   SBO (small bowel obstruction) (HCC) 02/18/2023   GAD (generalized anxiety disorder) 02/18/2023   Asthma, chronic 02/18/2023   Skin ulcer due to diabetes mellitus (HCC) 11/11/2022   Encounter for change or removal of nonsurgical wound dressing 11/02/2022   Type 2 diabetes mellitus with diabetic peripheral angiopathy without gangrene (HCC) 11/02/2022   Non-prs chronic ulcer oth prt l low leg w fat layer exposed (HCC) 11/02/2022   Diabetic ulcer of left lower leg (HCC) 10/24/2022   Chronic venous hypertension (idiopathic) with ulcer and inflammation of bilateral lower extremity (HCC) 10/24/2022   Non-healing wound of lower extremity 10/23/2022   Bipolar affective disorder, currently depressed, moderate (HCC) 09/02/2022   Peripheral vascular disorder due to diabetes mellitus (HCC) 09/02/2022   Alzheimer's disease (HCC) 07/14/2022   Cramps of lower extremity 07/14/2022   Other specified diabetes mellitus with diabetic polyneuropathy (HCC) 07/14/2022   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/14/2022   Type 2 diabetes mellitus with peripheral angiopathy (HCC) 07/14/2022   Hyperglycemia due to type 2 diabetes  mellitus (HCC) 07/14/2022   Bipolar disorder (HCC) 06/30/2022   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) 06/30/2022   Morbid (severe) obesity due to excess calories (HCC) 06/30/2022   Bacteria in urine 06/26/2022   Stasis ulcer of lower extremity (HCC) 06/26/2022   Elevated troponin 06/25/2022   Abnormal urinalysis 06/25/2022   History of CVA (cerebrovascular accident) 06/25/2022   Memory loss 06/25/2022   Unspecified dementia, unspecified severity, with anxiety (HCC) 06/16/2022   History of falling 06/16/2022   Long term (current) use of insulin  (HCC) 06/16/2022   Other chronic pain 06/16/2022   Body mass index (BMI) 50.0-59.9, adult (HCC) 06/16/2022   Cryptogenic stroke (HCC) 06/11/2022   Middle cerebral artery embolism, 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   Non-adherence  to medical treatment 03/15/2022   Chronic venous insufficiency 03/15/2022   DM (diabetes mellitus), type 2 (HCC) 03/15/2022   Stasis dermatitis of right lower extremity with venous ulcer due to chronic peripheral venous hypertension (HCC) 03/15/2022   Venous stasis ulcers of both lower extremities (HCC) 03/15/2022   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   Wound infection 02/05/2021   Venous stasis dermatitis of both lower extremities    Pneumonia due to COVID-19 virus 06/29/2019   Hypophosphatemia 06/24/2019   Obesity 06/23/2019   Insulin  dependent type 2 diabetes mellitus (HCC) 06/22/2019   Anxiety 06/22/2019   Essential hypertension 06/22/2019   Hyperlipidemia 06/22/2019   COVID-19 06/21/2019   Severe protein-calorie malnutrition 06/21/2019   Acute respiratory failure with hypoxia (HCC) 06/21/2019   Diabetic foot ulcer associated with type 2 diabetes mellitus (HCC)    Screening for cervical cancer 03/15/2017   Peripheral edema 09/09/2016   Venous stasis ulcer of left calf limited to breakdown of skin without varicose veins (HCC) 07/23/2016   Abnormal mammogram 07/16/2016   Cellulitis  07/05/2016   Anxiety and depression 05/30/2016   Lower extremity ulceration (HCC) 05/30/2016   Generalized weakness 05/30/2016   Left arm weakness 05/30/2016   Muscle spasm of both lower legs 05/19/2016   Neuropathy 05/19/2016   Sciatica of right side 05/19/2016   Diabetes mellitus (HCC) 05/19/2016   Left Lower extremity pain  04/02/2016   Lower extremity pain, inferior, left 04/02/2016   Left leg pain    Personal history of noncompliance with medical treatment, presenting hazards to health 03/31/2016   Jaw pain 03/27/2016   Left leg cellulitis 03/24/2016   Cellulitis of left lower extremity    Primary insomnia    Adjustment disorder    Morbid obesity (HCC) 01/27/2016   Wheelchair dependent 01/27/2016   Gout 01/27/2016   Arthritis 01/27/2016   Asthma 01/27/2016   Uncontrolled type 2 diabetes mellitus with hypoglycemia, with long-term current use of insulin  (HCC) 01/25/2016   Cellulitis of left leg 01/11/2016   Insulin -requiring or dependent type II diabetes mellitus (HCC) 01/11/2016   Hypertension 01/11/2016   Past Medical History:  Diagnosis Date   Anxiety    Arthritis    back, arms, legs (03/24/2016)   Asthma    Chronic lower back pain    Colonic polyp    last colonoscopy done in 2009 with normal results per medical record   Depressive disorder    Gastric polyp    Gout    has taken allopurinol  300mg  once daily in past   Headache    History of hiatal hernia    Hypertension    Migraine    none in awhile; might have a couple/year (03/24/2016)   Mixed hyperlipidemia    01/2016 Total chol 141, HDL 59, LDL 63, ration 1.1   Osteoarthritis    TIA (transient ischemic attack) 11/2014   Type II diabetes mellitus (HCC)     Family History  Problem Relation Age of Onset   Stroke Father    Stroke Brother    Cancer Other    Gout Mother    Hypertension Mother    Arthritis Mother    Asthma Mother    Stroke Brother     Past Surgical History:  Procedure Laterality Date    APPLICATION OF WOUND VAC Bilateral 04/08/2024   Procedure: APPLICATION, WOUND VAC;  Surgeon: Harden Jerona GAILS, MD;  Location: [facility] OR;  Service: Orthopedics;  Laterality: Bilateral;   ARTERY BIOPSY Right 08/12/2016  Procedure: BIOPSY TEMPORAL ARTERY;  Surgeon: Carlin FORBES Haddock, MD;  Location: [facility] OR;  Service: Vascular;  Laterality: Right;  BIOPSY TEMPORAL ARTERY   BREAST BIOPSY Left ~ 2015   benign   CARPAL TUNNEL RELEASE Bilateral    DILATION AND CURETTAGE OF UTERUS     I & D EXTREMITY Bilateral 10/24/2022   Procedure: DEBRIDEMENT LEFT LEG AND RIGHT ANKLE;  Surgeon: Harden Jerona GAILS, MD;  Location: [facility] OR;  Service: Orthopedics;  Laterality: Bilateral;   INCISION AND DRAINAGE OF DEEP ABSCESS, CALF Bilateral 04/08/2024   Procedure: INCISION AND DRAINAGE OF DEEP ABSCESS, CALF;  Surgeon: Harden Jerona GAILS, MD;  Location: [facility] OR;  Service: Orthopedics;  Laterality: Bilateral;  BILATERAL LEG DEBRIDEMENT   IR CT HEAD LTD  06/11/2022   IR PERCUTANEOUS ART THROMBECTOMY/INFUSION INTRACRANIAL INC DIAG ANGIO  06/11/2022   IR US  GUIDE VASC ACCESS RIGHT  06/11/2022   KNEE ARTHROSCOPY Right 2003   in [facility]   LAPAROSCOPIC CHOLECYSTECTOMY     LOOP RECORDER INSERTION N/A 06/26/2022   Procedure: LOOP RECORDER INSERTION;  Surgeon: Inocencio Soyla Lunger, MD;  Location: [facility];  Service: Cardiovascular;  Laterality: N/A;   RADIOLOGY WITH ANESTHESIA N/A 06/11/2022   Procedure: IR WITH ANESTHESIA;  Surgeon: Radiologist, Medication, MD;  Location: [facility] OR;  Service: Radiology;  Laterality: N/A;   TUBAL LIGATION     VAGINAL HYSTERECTOMY  1982   Social History   Occupational History   Not on file  Tobacco Use   Smoking status: Never   Smokeless tobacco: Never  Vaping Use   Vaping status: Never Used  Substance and Sexual Activity   Alcohol use: No   Drug use: No   Sexual activity: Never

## 2024-05-10 ENCOUNTER — Encounter: Admitting: Physician Assistant

## 2024-05-19 ENCOUNTER — Ambulatory Visit: Admitting: Physician Assistant

## 2024-05-19 DIAGNOSIS — E08621 Diabetes mellitus due to underlying condition with foot ulcer: Secondary | ICD-10-CM

## 2024-05-19 DIAGNOSIS — E11622 Type 2 diabetes mellitus with other skin ulcer: Secondary | ICD-10-CM

## 2024-05-19 DIAGNOSIS — L97419 Non-pressure chronic ulcer of right heel and midfoot with unspecified severity: Secondary | ICD-10-CM

## 2024-05-19 DIAGNOSIS — L97929 Non-pressure chronic ulcer of unspecified part of left lower leg with unspecified severity: Secondary | ICD-10-CM

## 2024-05-20 ENCOUNTER — Encounter: Payer: Self-pay | Admitting: Physician Assistant

## 2024-05-20 NOTE — Progress Notes (Signed)
 Office Visit Note   Patient: [name]           Date of Birth: Mar 26, 1951           MRN: [id number] Visit Date: 05/19/2024              Requested by: Health, [facility] [address] [place],  [place] [address] PCP: Health, [facility]  Chief Complaint  Patient presents with   Right Leg - Routine Post Op    04/08/24 I&D bilateral calf   Left Leg - Routine Post Op      HPI: [name] is a 73 year old female who presents for follow-up of bilateral foot wounds.  She is s/p Excisional debridement right ankle with excision of skin and soft tissue muscle and fascia. She has not ambulated in more than 20 years.  She is WC dependent, and sits in a dependent position most of her waking hours.  She has undergone ABI's in Oct of 2025 which demonstrated normal ABI/TBI inflow with falsely elevated left toe pressures due to calcified vessels.    Assessment & Plan: Visit Diagnoses:  1. Diabetic ulcer of left lower leg (HCC)   2. Diabetic ulcer of right midfoot associated with diabetes mellitus due to underlying condition, unspecified ulcer stage (HCC)     Plan: No infection noted without cellulitis or significant edema.  The wounds overall appear viable.  She will continue to perform wound care with the help of her husband and son daily Vashe wet to dry dressing changes is recommended.  Elevation when at rest will help as well.  She does have a hospital bed to assist with elevation.    Follow-Up Instructions: Return in about 2 weeks (around 06/02/2024).  She will call our office to schedule this.    Ortho Exam  Patient is alert, oriented, no adenopathy, well-dressed, normal affect, normal respiratory effort. 10 cm x 4 cm right anterior ankle wound with 3 mm depth.  Yellow fibrinous tissue covering the granulation base was removed using 4 x 4 guaze and Vashe.  She denies pain.  She has intact toe motor minimally.  Good skin lines with minimal edema.  The left foot wound is pale with a  central wound wound measuring 3 cm x 1 cm and depth of 5 mm.  The surrounding tissue appears to be new skin.  The central wound was cleaned with a q tip and Vashe to produce pin point bleeding.      Imaging: No results found.     Labs: Lab Results  Component Value Date   HGBA1C 6.6 (H) 04/06/2024   HGBA1C 6.4 (H) 06/09/2023   HGBA1C 8.1 (H) 10/23/2022   ESRSEDRATE 110 (H) 05/20/2023   ESRSEDRATE 90 (H) 10/23/2022   ESRSEDRATE 117 (H) 03/06/2021   CRP 4.7 (H) 05/20/2023   CRP 3.8 (H) 10/23/2022   CRP 43.7 (H) 03/06/2021   LABURIC 7.7 (H) 05/20/2023   REPTSTATUS 04/13/2024 FINAL 04/08/2024   GRAMSTAIN  04/08/2024    FEW WBC PRESENT,BOTH PMN AND MONONUCLEAR MODERATE GRAM POSITIVE RODS RARE GRAM POSITIVE COCCI IN PAIRS RARE GRAM NEGATIVE RODS    CULT  04/08/2024    FEW CITROBACTER FREUNDII FEW CITROBACTER KOSERI NO ANAEROBES ISOLATED WITHIN MIXED ORGANISMS Performed at [facility], [address] [place], [place] [address]    IDOLINA MARINA KOHL 04/08/2024   LABORGA CITROBACTER KOSERI 04/08/2024     Lab Results  Component Value Date   ALBUMIN  2.4 (L) 04/11/2024   ALBUMIN 2.3 (L) 04/09/2024   ALBUMIN 2.3 (L) 04/07/2024   PREALBUMIN 12.5 (L) 03/16/2017    Lab Results  Component Value Date   MG 1.9 04/11/2024   MG 1.9 04/09/2024   MG 1.9 04/07/2024   No results found for: [facility]  Lab Results  Component Value Date   PREALBUMIN 12.5 (L) 03/16/2017      Latest Ref Rng & Units 04/11/2024    6:27 AM 04/09/2024    1:51 AM 04/07/2024    3:00 AM  CBC EXTENDED  WBC 4.0 - 10.5 K/uL 6.5  5.7  5.7   RBC 3.87 - 5.11 MIL/uL 3.86  3.69  4.08   Hemoglobin 12.0 - 15.0 g/dL 89.6  9.8  89.2   HCT 63.9 - 46.0 % 32.9  32.0  35.0   Platelets 150 - 400 K/uL 290  318  350      There is no height or weight on file to calculate BMI.  Orders:  No orders of the defined types were placed in this encounter.  No orders of the defined types were placed in  this encounter.    Procedures: No procedures performed  Clinical Data: No additional findings.  ROS:  All other systems negative, except as noted in the HPI. Review of Systems  Objective: Vital Signs: There were no vitals taken for this visit.  Specialty Comments:  No specialty comments available.  PMFS History: Patient Active Problem List   Diagnosis Date Noted   Cutaneous abscess of left foot 04/08/2024   Open wound of both lower extremities 04/05/2024   Venous stasis ulcer (HCC) 04/04/2024   Seizure (HCC) 06/10/2023   Stroke-like symptoms 06/09/2023   Complex partial seizure (HCC) 06/09/2023   Ulcer of extremity due to chronic venous insufficiency (HCC) 02/19/2023   Lymphedema 02/19/2023   SBO (small bowel obstruction) (HCC) 02/18/2023   GAD (generalized anxiety disorder) 02/18/2023   Asthma, chronic 02/18/2023   Skin ulcer due to diabetes mellitus (HCC) 11/11/2022   Encounter for change or removal of nonsurgical wound dressing 11/02/2022   Type 2 diabetes mellitus with diabetic peripheral angiopathy without gangrene (HCC) 11/02/2022   Non-prs chronic ulcer oth prt l low leg w fat layer exposed (HCC) 11/02/2022   Diabetic ulcer of left lower leg (HCC) 10/24/2022   Chronic venous hypertension (idiopathic) with ulcer and inflammation of bilateral lower extremity (HCC) 10/24/2022   Non-healing wound of lower extremity 10/23/2022   Bipolar affective disorder, currently depressed, moderate (HCC) 09/02/2022   Peripheral vascular disorder due to diabetes mellitus (HCC) 09/02/2022   Alzheimer's disease (HCC) 07/14/2022   Cramps of lower extremity 07/14/2022   Other specified diabetes mellitus with diabetic polyneuropathy (HCC) 07/14/2022   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/14/2022   Type 2 diabetes mellitus with peripheral angiopathy (HCC) 07/14/2022   Hyperglycemia due to type 2 diabetes mellitus (HCC) 07/14/2022   Bipolar disorder (HCC) 06/30/2022    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) 06/30/2022   Morbid (severe) obesity due to excess calories (HCC) 06/30/2022   Bacteria in urine 06/26/2022   Stasis ulcer of lower extremity (HCC) 06/26/2022   Elevated troponin 06/25/2022   Abnormal urinalysis 06/25/2022   History of CVA (cerebrovascular accident) 06/25/2022   Memory loss 06/25/2022   Unspecified dementia, unspecified severity, with anxiety (HCC) 06/16/2022   History of falling 06/16/2022   Long term (current) use of insulin  (HCC) 06/16/2022   Other chronic pain 06/16/2022  Body mass index (BMI) 50.0-59.9, adult (HCC) 06/16/2022   Cryptogenic stroke (HCC) 06/11/2022   Middle cerebral artery embolism, 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   Non-adherence to medical treatment 03/15/2022   Chronic venous insufficiency 03/15/2022   DM (diabetes mellitus), type 2 (HCC) 03/15/2022   Stasis dermatitis of right lower extremity with venous ulcer due to chronic peripheral venous hypertension (HCC) 03/15/2022   Venous stasis ulcers of both lower extremities (HCC) 03/15/2022   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   Wound infection 02/05/2021   Venous stasis dermatitis of both lower extremities    Pneumonia due to COVID-19 virus 06/29/2019   Hypophosphatemia 06/24/2019   Obesity 06/23/2019   Insulin  dependent type 2 diabetes mellitus (HCC) 06/22/2019   Anxiety 06/22/2019   Essential hypertension 06/22/2019   Hyperlipidemia 06/22/2019   COVID-19 06/21/2019   Severe protein-calorie malnutrition 06/21/2019   Acute respiratory failure with hypoxia (HCC) 06/21/2019   Diabetic foot ulcer associated with type 2 diabetes mellitus (HCC)    Screening for cervical cancer 03/15/2017   Peripheral edema 09/09/2016   Venous stasis ulcer of left calf limited to breakdown of skin without varicose veins (HCC) 07/23/2016   Abnormal mammogram 07/16/2016   Cellulitis 07/05/2016   Anxiety and depression 05/30/2016   Lower extremity  ulceration (HCC) 05/30/2016   Generalized weakness 05/30/2016   Left arm weakness 05/30/2016   Muscle spasm of both lower legs 05/19/2016   Neuropathy 05/19/2016   Sciatica of right side 05/19/2016   Diabetes mellitus (HCC) 05/19/2016   Left Lower extremity pain  04/02/2016   Lower extremity pain, inferior, left 04/02/2016   Left leg pain    Personal history of noncompliance with medical treatment, presenting hazards to health 03/31/2016   Jaw pain 03/27/2016   Left leg cellulitis 03/24/2016   Cellulitis of left lower extremity    Primary insomnia    Adjustment disorder    Morbid obesity (HCC) 01/27/2016   Wheelchair dependent 01/27/2016   Gout 01/27/2016   Arthritis 01/27/2016   Asthma 01/27/2016   Uncontrolled type 2 diabetes mellitus with hypoglycemia, with long-term current use of insulin  (HCC) 01/25/2016   Cellulitis of left leg 01/11/2016   Insulin -requiring or dependent type II diabetes mellitus (HCC) 01/11/2016   Hypertension 01/11/2016   Past Medical History:  Diagnosis Date   Anxiety    Arthritis    back, arms, legs (03/24/2016)   Asthma    Chronic lower back pain    Colonic polyp    last colonoscopy done in 2009 with normal results per medical record   Depressive disorder    Gastric polyp    Gout    has taken allopurinol  300mg  once daily in past   Headache    History of hiatal hernia    Hypertension    Migraine    none in awhile; might have a couple/year (03/24/2016)   Mixed hyperlipidemia    01/2016 Total chol 141, HDL 59, LDL 63, ration 1.1   Osteoarthritis    TIA (transient ischemic attack) 11/2014   Type II diabetes mellitus (HCC)     Family History  Problem Relation Age of Onset   Stroke Father    Stroke Brother    Cancer Other    Gout Mother    Hypertension Mother    Arthritis Mother    Asthma Mother    Stroke Brother     Past Surgical History:  Procedure Laterality Date   APPLICATION OF WOUND VAC Bilateral 04/08/2024   Procedure:  APPLICATION, WOUND  VAC;  Surgeon: Harden Jerona GAILS, MD;  Location: [facility] OR;  Service: Orthopedics;  Laterality: Bilateral;   ARTERY BIOPSY Right 08/12/2016   Procedure: BIOPSY TEMPORAL ARTERY;  Surgeon: Carlin FORBES Haddock, MD;  Location: [facility] OR;  Service: Vascular;  Laterality: Right;  BIOPSY TEMPORAL ARTERY   BREAST BIOPSY Left ~ 2015   benign   CARPAL TUNNEL RELEASE Bilateral    DILATION AND CURETTAGE OF UTERUS     I & D EXTREMITY Bilateral 10/24/2022   Procedure: DEBRIDEMENT LEFT LEG AND RIGHT ANKLE;  Surgeon: Harden Jerona GAILS, MD;  Location: [facility] OR;  Service: Orthopedics;  Laterality: Bilateral;   INCISION AND DRAINAGE OF DEEP ABSCESS, CALF Bilateral 04/08/2024   Procedure: INCISION AND DRAINAGE OF DEEP ABSCESS, CALF;  Surgeon: Harden Jerona GAILS, MD;  Location: [facility] OR;  Service: Orthopedics;  Laterality: Bilateral;  BILATERAL LEG DEBRIDEMENT   IR CT HEAD LTD  06/11/2022   IR PERCUTANEOUS ART THROMBECTOMY/INFUSION INTRACRANIAL INC DIAG ANGIO  06/11/2022   IR US  GUIDE VASC ACCESS RIGHT  06/11/2022   KNEE [facility]THROSCOPY Right 2003   in [facility]   LAPAROSCOPIC CHOLECYSTECTOMY     LOOP RECORDER INSERTION N/A 06/26/2022   Procedure: LOOP RECORDER INSERTION;  Surgeon: Inocencio Soyla Lunger, MD;  Location: [facility] LAB;  Service: Cardiovascular;  Laterality: N/A;   RADIOLOGY WITH ANESTHESIA N/A 06/11/2022   Procedure: IR WITH ANESTHESIA;  Surgeon: Radiologist, Medication, MD;  Location: [facility] OR;  Service: Radiology;  Laterality: N/A;   TUBAL LIGATION     VAGINAL HYSTERECTOMY  1982   Social History   Occupational History   Not on file  Tobacco Use   Smoking status: Never   Smokeless tobacco: Never  Vaping Use   Vaping status: Never Used  Substance and Sexual Activity   Alcohol use: No   Drug use: No   Sexual activity: Never

## 2024-06-01 ENCOUNTER — Emergency Department (HOSPITAL_COMMUNITY)

## 2024-06-01 ENCOUNTER — Other Ambulatory Visit: Payer: Self-pay

## 2024-06-01 ENCOUNTER — Encounter (HOSPITAL_COMMUNITY): Payer: Self-pay

## 2024-06-01 ENCOUNTER — Emergency Department (HOSPITAL_COMMUNITY)
Admission: EM | Admit: 2024-06-01 | Discharge: 2024-06-01 | Disposition: A | Discharge status: Home / Self Care | Attending: Emergency Medicine | Admitting: Emergency Medicine

## 2024-06-01 DIAGNOSIS — E119 Type 2 diabetes mellitus without complications: Secondary | ICD-10-CM | POA: Insufficient documentation

## 2024-06-01 DIAGNOSIS — I10 Essential (primary) hypertension: Secondary | ICD-10-CM | POA: Diagnosis not present

## 2024-06-01 DIAGNOSIS — R4781 Slurred speech: Secondary | ICD-10-CM | POA: Diagnosis present

## 2024-06-01 DIAGNOSIS — Z8673 Personal history of transient ischemic attack (TIA), and cerebral infarction without residual deficits: Secondary | ICD-10-CM | POA: Diagnosis not present

## 2024-06-01 DIAGNOSIS — Z7982 Long term (current) use of aspirin: Secondary | ICD-10-CM | POA: Insufficient documentation

## 2024-06-01 DIAGNOSIS — Z79899 Other long term (current) drug therapy: Secondary | ICD-10-CM | POA: Insufficient documentation

## 2024-06-01 DIAGNOSIS — T426X6A Underdosing of other antiepileptic and sedative-hypnotic drugs, initial encounter: Secondary | ICD-10-CM | POA: Diagnosis not present

## 2024-06-01 DIAGNOSIS — F319 Bipolar disorder, unspecified: Secondary | ICD-10-CM | POA: Diagnosis not present

## 2024-06-01 DIAGNOSIS — Z91148 Patient's other noncompliance with medication regimen for other reason: Secondary | ICD-10-CM | POA: Diagnosis not present

## 2024-06-01 DIAGNOSIS — R569 Unspecified convulsions: Secondary | ICD-10-CM | POA: Insufficient documentation

## 2024-06-01 DIAGNOSIS — Z91040 Latex allergy status: Secondary | ICD-10-CM | POA: Insufficient documentation

## 2024-06-01 DIAGNOSIS — Z91130 Patient's unintentional underdosing of medication regimen due to age-related debility: Secondary | ICD-10-CM | POA: Diagnosis not present

## 2024-06-01 DIAGNOSIS — I872 Venous insufficiency (chronic) (peripheral): Secondary | ICD-10-CM | POA: Insufficient documentation

## 2024-06-01 DIAGNOSIS — G40209 Localization-related (focal) (partial) symptomatic epilepsy and epileptic syndromes with complex partial seizures, not intractable, without status epilepticus: Secondary | ICD-10-CM | POA: Insufficient documentation

## 2024-06-01 DIAGNOSIS — I639 Cerebral infarction, unspecified: Secondary | ICD-10-CM | POA: Diagnosis present

## 2024-06-01 DIAGNOSIS — I890 Lymphedema, not elsewhere classified: Secondary | ICD-10-CM | POA: Diagnosis not present

## 2024-06-01 LAB — I-STAT CHEM 8, ED
BUN: 19 mg/dL (ref 8–23)
Calcium, Ion: 1.19 mmol/L (ref 1.15–1.40)
Chloride: 105 mmol/L (ref 98–111)
Creatinine, Ser: 0.8 mg/dL (ref 0.44–1.00)
Glucose, Bld: 125 mg/dL — ABNORMAL HIGH (ref 70–99)
HCT: 39 % (ref 36.0–46.0)
Hemoglobin: 13.3 g/dL (ref 12.0–15.0)
Potassium: 4.2 mmol/L (ref 3.5–5.1)
Sodium: 141 mmol/L (ref 135–145)
TCO2: 23 mmol/L (ref 22–32)

## 2024-06-01 LAB — CBC
HCT: 38.2 % (ref 36.0–46.0)
Hemoglobin: 12 g/dL (ref 12.0–15.0)
MCH: 26.8 pg (ref 26.0–34.0)
MCHC: 31.4 g/dL (ref 30.0–36.0)
MCV: 85.3 fL (ref 80.0–100.0)
Platelets: 385 K/uL (ref 150–400)
RBC: 4.48 MIL/uL (ref 3.87–5.11)
RDW: 16.6 % — ABNORMAL HIGH (ref 11.5–15.5)
WBC: 8.3 K/uL (ref 4.0–10.5)
nRBC: 0 % (ref 0.0–0.2)

## 2024-06-01 LAB — URINALYSIS, W/ REFLEX TO CULTURE (INFECTION SUSPECTED)
Bacteria, UA: NONE SEEN
Bilirubin Urine: NEGATIVE
Glucose, UA: NEGATIVE mg/dL
Hgb urine dipstick: NEGATIVE
Ketones, ur: NEGATIVE mg/dL
Leukocytes,Ua: NEGATIVE
Nitrite: NEGATIVE
Protein, ur: NEGATIVE mg/dL
Specific Gravity, Urine: 1.005 (ref 1.005–1.030)
pH: 7 (ref 5.0–8.0)

## 2024-06-01 LAB — DIFFERENTIAL
Abs Immature Granulocytes: 0.03 K/uL (ref 0.00–0.07)
Basophils Absolute: 0 K/uL (ref 0.0–0.1)
Basophils Relative: 0 %
Eosinophils Absolute: 0.2 K/uL (ref 0.0–0.5)
Eosinophils Relative: 3 %
Immature Granulocytes: 0 %
Lymphocytes Relative: 28 %
Lymphs Abs: 2.3 K/uL (ref 0.7–4.0)
Monocytes Absolute: 0.6 K/uL (ref 0.1–1.0)
Monocytes Relative: 7 %
Neutro Abs: 5.1 K/uL (ref 1.7–7.7)
Neutrophils Relative %: 62 %

## 2024-06-01 LAB — COMPREHENSIVE METABOLIC PANEL WITH GFR
ALT: 9 U/L (ref 0–44)
AST: 17 U/L (ref 15–41)
Albumin: 3.9 g/dL (ref 3.5–5.0)
Alkaline Phosphatase: 132 U/L — ABNORMAL HIGH (ref 38–126)
Anion gap: 13 (ref 5–15)
BUN: 18 mg/dL (ref 8–23)
CO2: 22 mmol/L (ref 22–32)
Calcium: 10.2 mg/dL (ref 8.9–10.3)
Chloride: 102 mmol/L (ref 98–111)
Creatinine, Ser: 0.83 mg/dL (ref 0.44–1.00)
GFR, Estimated: >60 mL/min (ref >60)
Glucose, Bld: 118 mg/dL — ABNORMAL HIGH (ref 70–99)
Potassium: 4.2 mmol/L (ref 3.5–5.1)
Sodium: 137 mmol/L (ref 135–145)
Total Bilirubin: 0.4 mg/dL (ref 0.0–1.2)
Total Protein: 9.7 g/dL — ABNORMAL HIGH (ref 6.5–8.1)

## 2024-06-01 LAB — CBG MONITORING, ED: Glucose-Capillary: 116 mg/dL — ABNORMAL HIGH (ref 70–99)

## 2024-06-01 LAB — PROTIME-INR
INR: 1 (ref 0.8–1.2)
Prothrombin Time: 14.2 s (ref 11.4–15.2)

## 2024-06-01 LAB — ETHANOL: Alcohol, Ethyl (B): <15 mg/dL (ref <15)

## 2024-06-01 LAB — APTT: aPTT: 33 s (ref 24–36)

## 2024-06-01 MED ORDER — LORAZEPAM 2 MG/ML IJ SOLN
1.0000 mg | Freq: Once | INTRAMUSCULAR | Status: AC
  Administered 2024-06-01: 16:00:00 1 mg via INTRAVENOUS

## 2024-06-01 MED ORDER — LEVETIRACETAM 750 MG PO TABS: 750.0000 mg | ORAL_TABLET | Freq: Two times a day (BID) | ORAL | 1 refills | Status: AC

## 2024-06-01 MED ORDER — LORAZEPAM 2 MG/ML IJ SOLN
INTRAMUSCULAR | Status: AC
  Filled 2024-06-01: qty 1

## 2024-06-01 MED ORDER — LEVETIRACETAM (KEPPRA) 500 MG/5 ML ADULT IV PUSH
1500.0000 mg | Freq: Once | INTRAVENOUS | Status: AC
  Administered 2024-06-01: 16:00:00 1500 mg via INTRAVENOUS

## 2024-06-01 MED ORDER — SODIUM CHLORIDE 0.9% FLUSH
3.0000 mL | Freq: Once | INTRAVENOUS | Status: AC
  Administered 2024-06-01: 21:00:00 3 mL via INTRAVENOUS

## 2024-06-01 NOTE — ED Notes (Signed)
 Pt has a brief choking episode while eating sandwich; airway cleared; NAD at this time; Dr. Pamella notified; will order for swallow eval and keep pt NPO at this time

## 2024-06-01 NOTE — Code Documentation (Signed)
 Stroke Response Nurse Documentation Code Documentation  [name] is a 73 y.o. female arriving to [place]  via Private Vehicle on 06/01/2024 with past medical hx of HTN, HLD, Seizures, R MCA, T2DM, and obesity. On aspirin  81 mg daily. Code stroke was activated by ED.   Patient from home, where she was LKW at 1500 and was noted to have issues with her speech by her husband. He brought her to the ED, and a code stroke was activated. However, as the patient was being wheeled to CT scanner, both ED RN and SRN noted facial twitching, at first while the patient was trying to speak to staff.  Stroke team at the bedside on patient arrival. Labs drawn and patient cleared for CT by Dr. Pamella. Patient to CT with team. NIHSS 6, see documentation for details and code stroke times. Patient with disoriented, left facial droop, bilateral limb ataxia, Expressive aphasia , and dysarthria  on exam. While in the CT scanner, SRN and NP noted event again and the decision to treat with 1mg  of Ativan  was made. Symptoms resolved and the patient was taken back to [facility]. [name] was then loaded with 1500mg  of Keppra .  The following imaging was completed:  CT Head. Patient is not a candidate for IV Thrombolytic due to stroke not suspected. Patient is not a candidate for IR due to stroke not suspected.   Care Plan: 1mg  of Ativan , 1500mg  Keppra  load, with neurology following and available as condition permits.   Process Delays Noted: None  Bedside handoff with ED RN Orvil.    Kimberley Dastrup M Morry Veiga  Stroke Response RN

## 2024-06-01 NOTE — Discharge Instructions (Signed)
 You were seen in the emerged from for changes in speech There was no evidence of stroke This may have been caused by a seizure We gave you Keppra  here but it is important that you pick up Keppra  from your pharmacy tomorrow morning and begin taking twice daily as previously directed Return to the Emergency Department for recurrent seizure activity or any other concerns Follow-up with Dr. Vear in the neurology office and your primary care doctor Outpatient seizure precautions: Per   [place] statutes, patients with seizures are not allowed to drive until  they have been seizure-free for six months. Use caution when using heavy equipment or power tools. Avoid working on ladders or at heights. Take showers instead of baths. Ensure the water temperature is not too high on the home water heater. Do not go swimming alone. When caring for infants or small children, sit down when holding, feeding, or changing them to minimize risk of injury to the child in the event you have a seizure. Also, Maintain good sleep hygiene. Avoid alcohol.

## 2024-06-01 NOTE — ED Triage Notes (Addendum)
 Pt accompanied by husband who reports LKW 1500. Pt having left sided facial droop, aphasia and dysarthria. Code stroke activated at sort and taken to charge desk for airway clearance

## 2024-06-01 NOTE — ED Notes (Signed)
 Pt lethargic upon discharge. Pamella, DO made aware and saw pt prior to d/c.

## 2024-06-01 NOTE — ED Provider Notes (Signed)
 [place] EMERGENCY DEPARTMENT AT [facility] Provider Note   CSN: [id number] Arrival date & time: 06/01/24  1551  An emergency department physician performed an initial assessment on this suspected stroke patient at 45.  Patient presents with: Code Stroke   [name] is a 73 y.o. female.  With a history of right MCA stroke, type 2 diabetes, seizure disorder and hypertension who presents to the ED for concern of stroke.  Acute onset left-sided facial droop difficulty with speech production and slurred speech around 1500 today appreciated by her husband.  Husband states speech is improving but still abnormal from her baseline.  Denies headaches nausea vomiting fevers chills recent illness.  Patient no longer taking Keppra  for seizures.  Husband denies recent seizure activity   HPI     Prior to Admission medications  Medication Sig Start Date End Date Taking? Authorizing Provider  allopurinol  (ZYLOPRIM ) 100 MG tablet Take 100 mg by mouth daily.    [provider]  aspirin  EC 81 MG tablet Take 81 mg by mouth daily. Swallow whole.    [provider]  atenolol  (TENORMIN ) 25 MG tablet Take by mouth daily.    [provider]  atorvastatin  (LIPITOR) 20 MG tablet Take 1 tablet (20 mg total) by mouth at bedtime. 06/11/23 04/05/25  Cindy Garnette POUR, MD  colchicine  0.6 MG tablet Take 0.6 mg by mouth daily. 03/22/24   [provider]  cyclobenzaprine  (FLEXERIL ) 5 MG tablet Take 5 mg by mouth daily as needed for muscle spasms.    [provider]  donepezil  (ARICEPT ) 10 MG tablet Take 10 mg by mouth at bedtime.    [provider]  gabapentin  (NEURONTIN ) 300 MG capsule Take 300 mg by mouth daily.    [provider]  levETIRAcetam  (KEPPRA ) 750 MG tablet Take 1 tablet (750 mg total) by mouth 2 (two) times daily. 06/01/24 07/31/24  Pamella Ozell LABOR, DO  mirtazapine  (REMERON ) 7.5 MG tablet Take 7.5 mg by mouth at  bedtime. Patient not taking: Reported on 04/05/2024    [provider]  NIFEdipine  (ADALAT  CC) 90 MG 24 hr tablet Take 90 mg by mouth daily. 02/23/24   [provider]  Oxycodone  HCl 10 MG TABS Take 1 tablet (10 mg total) by mouth every 6 (six) hours as needed for severe pain (pain score 7-10) or moderate pain (pain score 4-6). 04/12/24   Gherghe, Costin M, MD  pentoxifylline  (TRENTAL ) 400 MG CR tablet Take 1 tablet (400 mg total) by mouth 3 (three) times daily with meals. 11/04/23   Valdemar Rocky SAUNDERS, NP  sertraline  (ZOLOFT ) 50 MG tablet Take 50 mg by mouth daily. 03/22/24   [provider]  SYMBICORT 80-4.5 MCG/ACT inhaler Inhale 2 puffs into the lungs. 01/11/24   [provider]  TRESIBA  FLEXTOUCH 200 UNIT/ML FlexTouch Pen Inject 30 Units into the skin 2 (two) times daily. 01/12/24   [provider]    Allergies: Ace inhibitors, Latex, Ultram [tramadol], Zanaflex [tizanidine], Diprivan  [propofol ], and Metformin and related    Review of Systems  Updated Vital Signs BP 119/72   Pulse 77   Temp (!) 97.5 F (36.4 C) (Temporal)   Resp (!) 24   Wt 97 kg   SpO2 100%   BMI 36.71 kg/m   Physical Exam Vitals and nursing note reviewed.  HENT:     Head: Normocephalic and atraumatic.  Eyes:     Pupils: Pupils are equal, round, and reactive to light.  Cardiovascular:     Rate and Rhythm: Normal rate and regular rhythm.  Pulmonary:     Effort: Pulmonary effort is normal.     Breath sounds: Normal breath sounds.  Abdominal:     Palpations: Abdomen is soft.     Tenderness: There is no abdominal tenderness.  Skin:    General: Skin is warm and dry.     Comments: Chronic bilateral lower extremity wounds  Neurological:     Mental Status: She is alert.     Comments: Slurred speech Speaking full sentences Following commands 5 out of 5 motor strength bilateral upper and lower extremities  Psychiatric:        Mood and Affect: Mood normal.     (all  labs ordered are listed, but only abnormal results are displayed) Labs Reviewed  CBC - Abnormal; Notable for the following components:      Result Value   RDW 16.6 (*)    All other components within normal limits  COMPREHENSIVE METABOLIC PANEL WITH GFR - Abnormal; Notable for the following components:   Glucose, Bld 118 (*)    Total Protein 9.7 (*)    Alkaline Phosphatase 132 (*)    All other components within normal limits  URINALYSIS, W/ REFLEX TO CULTURE (INFECTION SUSPECTED) - Abnormal; Notable for the following components:   Color, Urine STRAW (*)    All other components within normal limits  I-STAT CHEM 8, ED - Abnormal; Notable for the following components:   Glucose, Bld 125 (*)    All other components within normal limits  CBG MONITORING, ED - Abnormal; Notable for the following components:   Glucose-Capillary 116 (*)    All other components within normal limits  PROTIME-INR  APTT  DIFFERENTIAL  ETHANOL    EKG: None  Radiology: CT HEAD CODE STROKE WO CONTRAST Result Date: 06/01/2024 EXAM: CT HEAD WITHOUT CONTRAST 06/01/2024 04:09:00 PM TECHNIQUE: CT of the head was performed without the administration of intravenous contrast. Automated exposure control, iterative reconstruction, and/or weight based adjustment of the mA/kV was utilized to reduce the radiation dose to as low as reasonably achievable. COMPARISON: 03/05/2024 CLINICAL HISTORY: Neuro deficit, acute, stroke suspected. FINDINGS: BRAIN AND VENTRICLES: No acute hemorrhage. No evidence of acute infarct. Right frontal lobe encephalomalacia consistent with remote infarct. Remote ischemic changes are present in the thalami bilaterally. No hydrocephalus. No extra-axial collection. No mass effect or midline shift. ORBITS: No acute abnormality. SINUSES: No acute abnormality. SOFT TISSUES AND SKULL: No acute soft tissue abnormality. No skull fracture. [place] Stroke Program Early CT Score (ASPECTS): Ganglionic (caudate, IC,  lentiform nucleus, insula, M1-M3): 7 Supraganglionic (M4-M6): 3 Total: 10 IMPRESSION: 1. No acute intracranial abnormality 2. ASPECTS score 10. 3. Right frontal lobe encephalomalacia consistent with remote infarct. 4. Remote ischemic changes in the thalami bilaterally. 5. These results were communicated to Dr. Michaela at 4:21 PM on 06/01/2024 by secure text page via the [facility] messaging system. Electronically signed by: Lonni Necessary MD 06/01/2024 04:22 PM EST RP Workstation: [id number]     Procedures   Medications Ordered in the ED  sodium chloride  flush (NS) 0.9 % injection 3 mL (3 mLs Intravenous Given 06/01/24 2042)  levETIRAcetam  (KEPPRA ) undiluted injection 1,500 mg (1,500 mg Intravenous Given by Other 06/01/24 1618)  LORazepam  (ATIVAN ) injection 1 mg (1 mg Intravenous Given by Other 06/01/24 1613)    Clinical Course as of 06/01/24 2216  Wed Jun 01, 2024  2212 CT head without findings of acute stroke.  No recurrent seizure  activity.  UA negative.  No underlying metabolic or infectious etiology that would explain patient's condition earlier.  She would be appropriate for outpatient neurology and PCP follow-up.  Will call in a refill for her Keppra  as she states she does not have this medication at home.  Both her and her husband understand the importance of picking this medication up tomorrow morning and taking as directed to prevent future seizures.  Will discharge with outpatient seizure precautions [MP]    Clinical Course User Index [MP] Pamella Ozell LABOR, DO                                 Medical Decision Making 73 year old female with history as above presenting to the ED given concern for stroke.  Last known well time 1500.  Code stroke activated from triage.  Examined alongside Dr. Michaela (neurology).  Still with some slurred speech.  Perhaps partial seizure as she has not been taking Keppra .  Will give 1 mg Ativan  and 1500 IV Keppra .  Dr. Michaela recommends  observation for at least several hours in the ED to ensure she does not have any more seizure activity and has returned to baseline.  Will look for underlying infectious etiology as there is a strong smell of urine and chronic bilateral lower extremity wounds  Amount and/or Complexity of Data Reviewed Labs: ordered. Radiology: ordered.  Risk Prescription drug management.        Final diagnoses:  Slurred speech  Seizure [facility])    ED Discharge Orders          Ordered    levETIRAcetam  (KEPPRA ) 750 MG tablet  2 times daily        06/01/24 2215               Pamella Ozell LABOR, DO 06/01/24 2216

## 2024-06-01 NOTE — ED Notes (Signed)
 Attempted to go urine sample from pt; placed pt on bed pan; pt had BM in addition to urine output; Dr. Pamella made aware; will attempt to recollect at a later time

## 2024-06-01 NOTE — Consult Note (Signed)
 NEUROLOGY CONSULT NOTE   Date of service: June 01, 2024 Patient Name: [name] MRN:  [id number] DOB:  1950/10/27 Chief Complaint: slurred speech Requesting Provider: Pamella Ozell LABOR, DO  History of Present Illness  [name] is a 73 y.o. female with hx of DM2, HTN, HLD, prior CVA, complex partial seizure disorder noncompliant with Keppra  (last filled in August for a one month supply), bipolar disorder, chronic venous stasis ulcer, PVD, lymphedema who presented to the ED via 12/17 for evaluation of acute onset of slurred speech first noticed by husband at 1500 today.  In triage, the EDP noted the patient with ongoing slurred speech and possible right sided weakness and activated a code stroke.  During stroke evaluation, patient had multiple episodes consistent with focal seizure with rhythmic twitching of the right mouth.  Patient has had previous presentations consistent with her current presentation with rhythmic twitching of the left side of her face and reports of noncompliance on Keppra .  Patient has had ER presentations in December 2024 as well as June 2025 with sudden onset of jerking of her face and feeling like her tongue is swollen.  On 06/10/2023 patient had an episode of left facial twitching, garbled speech, dysarthria, drooling from the left side of her mouth with residual left facial droop and dysarthria while in CT. At that time, patient reported having similar episodes of facial twitching and slurred speech at home.  An episode consistent with seizure was captured while patient was on EEG without concomitant EEG change though focal seizures may not be picked up on scalp EEG.   Of note, patient does report that she has trouble keeping up with her medications and that she used to have a nurse that would come and help organize her medicines but she no longer comes to the house.  LKW: 1500 Modified rankin score: 3-Moderate disability-requires help but walks WITHOUT  assistance IV Thrombolysis: No, presentation is most consistent with seizure activity with presentation in the past consistent with her current presentation, also felt to be seizure activity at that time.  Patient noncompliant with Keppra  since onset.  EVT: No, presentation is not consistent with LVO  NIHSS components Score: Comment  1a Level of Conscious 0[x]  1[]  2[]  3[]      1b LOC Questions 0[x]  1[]  2[]       1c LOC Commands 0[x]  1[]  2[]       2 Best Gaze 0[x]  1[]  2[]       3 Visual 0[x]  1[]  2[]  3[]      4 Facial Palsy 0[x]  1[]  2[]  3[]      5a Motor Arm - left 0[x]  1[]  2[]  3[]  4[]  UN[]    5b Motor Arm - Right 0[x]  1[]  2[]  3[]  4[]  UN[]    6a Motor Leg - Left 0[x]  1[]  2[]  3[]  4[]  UN[]    6b Motor Leg - Right 0[x]  1[]  2[]  3[]  4[]  UN[]    7 Limb Ataxia 0[x]  1[]  2[]  UN[]      8 Sensory 0[x]  1[]  2[]  UN[]      9 Best Language 0[x]  1[]  2[]  3[]      10 Dysarthria 0[]  1[x]  2[]  UN[]      11 Extinct. and Inattention 0[x]  1[]  2[]       TOTAL: 1    ROS  Comprehensive ROS performed and pertinent positives documented in HPI   Past History   Past Medical History:  Diagnosis Date   Anxiety    Arthritis    back, arms, legs (03/24/2016)   Asthma    Chronic lower  back pain    Colonic polyp    last colonoscopy done in 2009 with normal results per medical record   Depressive disorder    Gastric polyp    Gout    has taken allopurinol  300mg  once daily in past   Headache    History of hiatal hernia    Hypertension    Migraine    none in awhile; might have a couple/year (03/24/2016)   Mixed hyperlipidemia    01/2016 Total chol 141, HDL 59, LDL 63, ration 1.1   Osteoarthritis    TIA (transient ischemic attack) 11/2014   Type II diabetes mellitus (HCC)    Past Surgical History:  Procedure Laterality Date   APPLICATION OF WOUND VAC Bilateral 04/08/2024   Procedure: APPLICATION, WOUND VAC;  Surgeon: Harden Jerona GAILS, MD;  Location: [facility] OR;  Service: Orthopedics;  Laterality: Bilateral;   ARTERY BIOPSY  Right 08/12/2016   Procedure: BIOPSY TEMPORAL ARTERY;  Surgeon: Carlin FORBES Haddock, MD;  Location: [facility];  Service: Vascular;  Laterality: Right;  BIOPSY TEMPORAL ARTERY   BREAST BIOPSY Left ~ 2015   benign   CARPAL TUNNEL RELEASE Bilateral    DILATION AND CURETTAGE OF UTERUS     I & D EXTREMITY Bilateral 10/24/2022   Procedure: DEBRIDEMENT LEFT LEG AND RIGHT ANKLE;  Surgeon: Harden Jerona GAILS, MD;  Location: [facility] OR;  Service: Orthopedics;  Laterality: Bilateral;   INCISION AND DRAINAGE OF DEEP ABSCESS, CALF Bilateral 04/08/2024   Procedure: INCISION AND DRAINAGE OF DEEP ABSCESS, CALF;  Surgeon: Harden Jerona GAILS, MD;  Location: [facility] OR;  Service: Orthopedics;  Laterality: Bilateral;  BILATERAL LEG DEBRIDEMENT   IR CT HEAD LTD  06/11/2022   IR PERCUTANEOUS ART THROMBECTOMY/INFUSION INTRACRANIAL INC DIAG ANGIO  06/11/2022   IR US  GUIDE VASC ACCESS RIGHT  06/11/2022   KNEE ARTHROSCOPY Right 2003   in [facility]   LAPAROSCOPIC CHOLECYSTECTOMY     LOOP RECORDER INSERTION N/A 06/26/2022   Procedure: LOOP RECORDER INSERTION;  Surgeon: Inocencio Soyla Lunger, MD;  Location: [facility] INVASIVE CV LAB;  Service: Cardiovascular;  Laterality: N/A;   RADIOLOGY WITH ANESTHESIA N/A 06/11/2022   Procedure: IR WITH ANESTHESIA;  Surgeon: Radiologist, Medication, MD;  Location: [facility] OR;  Service: Radiology;  Laterality: N/A;   TUBAL LIGATION     VAGINAL HYSTERECTOMY  1982   Family History: Family History  Problem Relation Age of Onset   Stroke Father    Stroke Brother    Cancer Other    Gout Mother    Hypertension Mother    Arthritis Mother    Asthma Mother    Stroke Brother    Social History  reports that she has never smoked. She has never used smokeless tobacco. She reports that she does not drink alcohol and does not use drugs.  Allergies[1]  Medications  Current Medications[2]  Vitals   Vitals:   06/01/24 1552 06/01/24 1600  BP: (!) 151/90   Pulse: (!) 114   Resp: (!) 25   Temp: 98.4 F (36.9 C)   SpO2: 100%    Weight:  97 kg    Body mass index is 36.71 kg/m.  Physical Exam   Constitutional: Appears well-developed and well-nourished.  Psych: Affect appropriate to situation. She is calm and cooperative with exam Eyes: No scleral injection.  HENT: No OP obstruction.  Head: Normocephalic.  Cardiovascular: Normal rate and regular rhythm.  Respiratory: Effort normal, non-labored breathing on room air GI: Soft.  No distension. There is no tenderness.  Skin: Bilateral lower extremity lymphedema with serosanguineous drainage to wound dressings.   Neurologic Examination  Mental Status: Patient is awake, alert, oriented to self, medical place, age, month, and situation.  She is able to give some details regarding her history. Speech is significantly aphasic initially and clears during evaluation. She is able to follow commands and answer provider questions during seizure events.  Cranial Nerves: II: Visual Fields are full. Pupils are equal, round, and reactive to light.   III,IV, VI: EOMI without ptosis or diploplia.  V: Facial sensation is intact and symmetric to light touch VII: Face is symmetric resting and with movement.  Patient had 3 episodes of right mouth twitching and pulling that is rhythmic consistent with her history of seizures.  Initial mouth movements felt to be consistent with abnormal tongue sensation/seen briefly as patient is wheeled into CT.  Patient had 2 further episodes in the ED lasting approximately 1 minute each of right mouth rhythmic twitching with leftward pulling and subsequent slurred speech that was transient.  VIII: Hearing is intact to voice X: Phonation intact XI: Shoulder shrug is symmetric. XII: Tongue protrudes midline (left of midline during seizures) Motor: Tone is normal. Bulk is normal. 5/5 strength was present in all four extremities without unilateral weakness or asymmetry Sensory: Sensation is symmetric to light touch and temperature in the arms and  legs. No extinction to DSS present.  Cerebellar: FNF without overt ataxia  Labs/Imaging/Neurodiagnostic studies   CBC:  Recent Labs  Lab 2024-06-29 1603 06-29-24 1606  WBC 8.3  --   NEUTROABS 5.1  --   HGB 12.0 13.3  HCT 38.2 39.0  MCV 85.3  --   PLT 385  --    Basic Metabolic Panel:  Lab Results  Component Value Date   NA 137 04/11/2024   K 4.0 04/11/2024   CO2 21 (L) 04/11/2024   GLUCOSE 145 (H) 04/11/2024   BUN 22 04/11/2024   CREATININE 1.03 (H) 04/11/2024   CALCIUM  9.1 04/11/2024   GFRNONAA 57 (L) 04/11/2024   GFRAA >60 11/18/2019   Lipid Panel:  Lab Results  Component Value Date   LDLCALC 69 10/25/2022   HgbA1c:  Lab Results  Component Value Date   HGBA1C 6.6 (H) 04/06/2024   Urine Drug Screen:     Component Value Date/Time   LABOPIA NONE DETECTED 06/09/2023 1618   COCAINSCRNUR NONE DETECTED 06/09/2023 1618   LABBENZ NONE DETECTED 06/09/2023 1618   AMPHETMU NONE DETECTED 06/09/2023 1618   THCU NONE DETECTED 06/09/2023 1618   LABBARB NONE DETECTED 06/09/2023 1618    Alcohol Level     Component Value Date/Time   ETH <10 06/09/2023 0900   INR  Lab Results  Component Value Date   INR 1.1 04/04/2024   APTT  Lab Results  Component Value Date   APTT 32 06/09/2023   AED levels: No results found for: PHENYTOIN, ZONISAMIDE, LAMOTRIGINE, LEVETIRACETA  CT Head without contrast(Personally reviewed): No acute intracranial abnormality.  Aspects score 10.  Right frontal lobe encephalomalacia consistent with remote infarct.  Remote ischemic changes in the thalami bilaterally.  ASSESSMENT   VALI CAPANO is a 73 y.o. female with PMHx of DM2, HTN, HLD, prior CVA, complex partial seizure disorder noncompliant with Keppra , bipolar disorder, chronic venous stasis ulcer, PVD, lymphedema who presented to the ED 2024-06-29 for evaluation of acute onset of slurred speech noticed by her husband at 115:00 today.  In triage, EDP noted the patient with ongoing  slurred speech, possible  right-sided weakness, left mouth weakness and activated a code stroke.  During neurology evaluation in CT, patient was noted to have multiple episodes lasting approximately 1 minute each of right mouth rhythmic twitching and associated slurred speech consistent with seizure activity. She was given 2 mg IV ativan  and loaded with 1500 mg of Keppra .  Code stroke was canceled as cadence of rhythmic twitching is consistent with seizure activity.  Patient reports noncompliance with Keppra  as she was not aware that she needed to take it.  She does seem to have some trouble organizing/managing home medications independently.  RECOMMENDATIONS  - Loaded with 1500 mg Keppra  IV and 2 mg IV Ativan  - Start Keppra  500 mg BID ongoing - Discussed with patient that she will need to take Keppra  consistently from this point forward for seizure control -Seizure precautions: Per [place]  DMV statutes, patients with seizures are not allowed to drive until they have been seizure-free for six months and cleared by a physician    Use caution when using heavy equipment or power tools. Avoid working on ladders or at heights. Take showers instead of baths. Ensure the water temperature is not too high on the home water heater. Do not go swimming alone. Do not lock yourself in a room alone (i.e. bathroom). When caring for infants or small children, sit down when holding, feeding, or changing them to minimize risk of injury to the child in the event you have a seizure. Maintain good sleep hygiene. Avoid alcohol.    If patient has another seizure, call 911 and bring them back to the ED if: A.  The seizure lasts longer than 5 minutes.      B.  The patient doesn't wake shortly after the seizure or has new problems such as difficulty seeing, speaking or moving following the seizure C.  The patient was injured during the seizure D.  The patient has a temperature over 102 F (39C) E.  The patient vomited  during the seizure and now is having trouble breathing    During the Seizure   - First, ensure adequate ventilation and place patients on the floor on their left side  Loosen clothing around the neck and ensure the airway is patent. If the patient is clenching the teeth, do not force the mouth open with any object as this can cause severe damage - Remove all items from the surrounding that can be hazardous. The patient may be oblivious to what's happening and may not even know what he or she is doing. If the patient is confused and wandering, either gently guide him/her away and block access to outside areas - Reassure the individual and be comforting - Call 911. In most cases, the seizure ends before EMS arrives. However, there are cases when seizures may last over 3 to 5 minutes. Or the individual may have developed breathing difficulties or severe injuries. If a pregnant patient or a person with diabetes develops a seizure, it is prudent to call an ambulance. - Finally, if the patient does not regain full consciousness, then call EMS. Most patients will remain confused for about 45 to 90 minutes after a seizure, so you must use judgment in calling for help. - Avoid restraints but make sure the patient is in a bed with padded side rails - Place the individual in a lateral position with the neck slightly flexed; this will help the saliva drain from the mouth and prevent the tongue from falling backward - Remove all nearby furniture  and other hazards from the area - Provide verbal assurance as the individual is regaining consciousness - Provide the patient with privacy if possible - Call for help and start treatment as ordered by the caregiver    After the Seizure (Postictal Stage)   After a seizure, most patients experience confusion, fatigue, muscle pain and/or a headache. Thus, one should permit the individual to sleep. For the next few days, reassurance is essential. Being calm and helping  reorient the person is also of importance.   Most seizures are painless and end spontaneously. Seizures are not harmful to others but can lead to complications such as stress on the lungs, brain and the heart. Individuals with prior lung problems may develop labored breathing and respiratory distress.  __________________________________________________________________   Bonney Mimi LELON Denna, NP [facility]  I have seen the patient and reviewed the above note.  The patient had clear focal clonic activity without involvement of the arms or changing consciousness.  She has not been taking her Keppra  as prescribed, and I suspect that this does represent breakthrough seizure in the setting of medication noncompliance.  I would screen for infection, UA, exam per ED.   From a neurological perspective, her seizures stopped with treatment and as long as she remains seizure-free for several hours, I do not feel strongly that she would need admission for this alone.  If she were to have any further seizures from this point forward, then I do think observation would be indicated.  Aisha Seals, MD [facility]   If 7pm- 7am, please page neurology on call as listed in AMION.      [1]  Allergies Allergen Reactions   Ace Inhibitors Swelling   Latex Itching and Swelling   Ultram [Tramadol] Nausea And Vomiting   Zanaflex [Tizanidine] Other (See Comments)    Tremors    Diprivan  [Propofol ] Itching   Metformin And Related Other (See Comments)    Tremors  Chills  [2]  Current Facility-Administered Medications:    levETIRAcetam  (KEPPRA ) undiluted injection 1,500 mg, 1,500 mg, Intravenous, Once, Pamella Sharper A, DO, 1,500 mg at 06/01/24 1618   sodium chloride  flush (NS) 0.9 % injection 3 mL, 3 mL, Intravenous, Once, Pamella Sharper LABOR, DO  Current Outpatient Medications:    allopurinol  (ZYLOPRIM ) 100 MG tablet, Take 100 mg by mouth daily., Disp: , Rfl:    aspirin  EC  81 MG tablet, Take 81 mg by mouth daily. Swallow whole., Disp: , Rfl:    atenolol  (TENORMIN ) 25 MG tablet, Take by mouth daily., Disp: , Rfl:    atorvastatin  (LIPITOR) 20 MG tablet, Take 1 tablet (20 mg total) by mouth at bedtime., Disp: 30 tablet, Rfl: 0   colchicine  0.6 MG tablet, Take 0.6 mg by mouth daily., Disp: , Rfl:    cyclobenzaprine  (FLEXERIL ) 5 MG tablet, Take 5 mg by mouth daily as needed for muscle spasms., Disp: , Rfl:    donepezil  (ARICEPT ) 10 MG tablet, Take 10 mg by mouth at bedtime., Disp: , Rfl:    gabapentin  (NEURONTIN ) 300 MG capsule, Take 300 mg by mouth daily., Disp: , Rfl:    levETIRAcetam  (KEPPRA ) 750 MG tablet, Take 1 tablet (750 mg total) by mouth 2 (two) times daily., Disp: 180 tablet, Rfl: 4   mirtazapine  (REMERON ) 7.5 MG tablet, Take 7.5 mg by mouth at bedtime. (Patient not taking: Reported on 04/05/2024), Disp: , Rfl:    NIFEdipine  (ADALAT  CC) 90 MG 24 hr tablet, Take 90 mg by mouth daily., Disp: ,  Rfl:    Oxycodone  HCl 10 MG TABS, Take 1 tablet (10 mg total) by mouth every 6 (six) hours as needed for severe pain (pain score 7-10) or moderate pain (pain score 4-6)., Disp: 20 tablet, Rfl: 0   pentoxifylline  (TRENTAL ) 400 MG CR tablet, Take 1 tablet (400 mg total) by mouth 3 (three) times daily with meals., Disp: 90 tablet, Rfl: 3   sertraline  (ZOLOFT ) 50 MG tablet, Take 50 mg by mouth daily., Disp: , Rfl:    SYMBICORT 80-4.5 MCG/ACT inhaler, Inhale 2 puffs into the lungs., Disp: , Rfl:    TRESIBA  FLEXTOUCH 200 UNIT/ML FlexTouch Pen, Inject 30 Units into the skin 2 (two) times daily., Disp: , Rfl:

## 2024-06-01 NOTE — ED Notes (Signed)
 Pt given food and beverage; okay per Dr. Pamella

## 2024-12-27 ENCOUNTER — Ambulatory Visit: Admitting: Family Medicine

## 2024-12-29 ENCOUNTER — Ambulatory Visit: Admitting: Family Medicine
